# Patient Record
Sex: Female | Born: 1970 | Race: Black or African American | Hispanic: No | Marital: Single | Smoking: Never smoker
Health system: Southern US, Community
[De-identification: ages and names within clinical notes are randomized; demographics above are authoritative.]

## PROBLEM LIST (undated history)

## (undated) DIAGNOSIS — K315 Obstruction of duodenum: Secondary | ICD-10-CM

## (undated) DIAGNOSIS — T7840XA Allergy, unspecified, initial encounter: Secondary | ICD-10-CM

## (undated) DIAGNOSIS — C50911 Malignant neoplasm of unspecified site of right female breast: Secondary | ICD-10-CM

## (undated) DIAGNOSIS — Z8049 Family history of malignant neoplasm of other genital organs: Secondary | ICD-10-CM

## (undated) DIAGNOSIS — J45909 Unspecified asthma, uncomplicated: Secondary | ICD-10-CM

## (undated) DIAGNOSIS — E785 Hyperlipidemia, unspecified: Secondary | ICD-10-CM

## (undated) DIAGNOSIS — R51 Headache: Secondary | ICD-10-CM

## (undated) DIAGNOSIS — F419 Anxiety disorder, unspecified: Secondary | ICD-10-CM

## (undated) DIAGNOSIS — Z931 Gastrostomy status: Secondary | ICD-10-CM

## (undated) DIAGNOSIS — F32A Depression, unspecified: Secondary | ICD-10-CM

## (undated) DIAGNOSIS — G893 Neoplasm related pain (acute) (chronic): Secondary | ICD-10-CM

## (undated) DIAGNOSIS — C786 Secondary malignant neoplasm of retroperitoneum and peritoneum: Secondary | ICD-10-CM

## (undated) DIAGNOSIS — I471 Supraventricular tachycardia, unspecified: Secondary | ICD-10-CM

## (undated) DIAGNOSIS — Z923 Personal history of irradiation: Secondary | ICD-10-CM

## (undated) DIAGNOSIS — I10 Essential (primary) hypertension: Secondary | ICD-10-CM

## (undated) DIAGNOSIS — C801 Malignant (primary) neoplasm, unspecified: Secondary | ICD-10-CM

## (undated) DIAGNOSIS — K219 Gastro-esophageal reflux disease without esophagitis: Secondary | ICD-10-CM

## (undated) HISTORY — DX: Anxiety disorder, unspecified: F41.9

## (undated) HISTORY — DX: Hyperlipidemia, unspecified: E78.5

## (undated) HISTORY — DX: Allergy, unspecified, initial encounter: T78.40XA

## (undated) HISTORY — DX: Depression, unspecified: F32.A

## (undated) HISTORY — DX: Gastro-esophageal reflux disease without esophagitis: K21.9

## (undated) HISTORY — PX: ABDOMINAL HYSTERECTOMY: SHX81

## (undated) HISTORY — PX: BREAST SURGERY: SHX581

## (undated) HISTORY — DX: Family history of malignant neoplasm of other genital organs: Z80.49

## (undated) HISTORY — PX: TUBAL LIGATION: SHX77

---

## 2003-10-29 ENCOUNTER — Emergency Department (HOSPITAL_COMMUNITY): Admission: EM | Admit: 2003-10-29 | Discharge: 2003-10-30 | Payer: Self-pay | Admitting: Emergency Medicine

## 2008-06-21 DIAGNOSIS — I82409 Acute embolism and thrombosis of unspecified deep veins of unspecified lower extremity: Secondary | ICD-10-CM

## 2008-06-21 HISTORY — DX: Acute embolism and thrombosis of unspecified deep veins of unspecified lower extremity: I82.409

## 2009-08-18 ENCOUNTER — Ambulatory Visit: Payer: Self-pay | Admitting: Internal Medicine

## 2009-08-21 ENCOUNTER — Encounter: Admission: RE | Admit: 2009-08-21 | Discharge: 2009-08-21 | Payer: Self-pay | Admitting: Family Medicine

## 2009-09-05 ENCOUNTER — Ambulatory Visit: Payer: Self-pay | Admitting: Internal Medicine

## 2009-09-05 ENCOUNTER — Encounter (INDEPENDENT_AMBULATORY_CARE_PROVIDER_SITE_OTHER): Payer: Self-pay | Admitting: Family Medicine

## 2009-09-05 LAB — CONVERTED CEMR LAB
AST: 17 units/L (ref 0–37)
Albumin: 4 g/dL (ref 3.5–5.2)
Alkaline Phosphatase: 65 units/L (ref 39–117)
BUN: 11 mg/dL (ref 6–23)
Basophils Absolute: 0.1 10*3/uL (ref 0.0–0.1)
Basophils Relative: 1 % (ref 0–1)
Eosinophils Relative: 2 % (ref 0–5)
Glucose, Bld: 126 mg/dL — ABNORMAL HIGH (ref 70–99)
HDL: 56 mg/dL (ref >39)
Hemoglobin: 9.9 g/dL — ABNORMAL LOW (ref 12.0–15.0)
LDL Cholesterol: 89 mg/dL (ref 0–99)
Lymphocytes Relative: 33 % (ref 12–46)
MCHC: 29.6 g/dL — ABNORMAL LOW (ref 30.0–36.0)
Monocytes Absolute: 0.3 10*3/uL (ref 0.1–1.0)
Neutro Abs: 5.1 10*3/uL (ref 1.7–7.7)
Platelets: 338 10*3/uL (ref 150–400)
Potassium: 3.8 meq/L (ref 3.5–5.3)
RDW: 19.5 % — ABNORMAL HIGH (ref 11.5–15.5)
Sodium: 139 meq/L (ref 135–145)
Total Bilirubin: 0.2 mg/dL — ABNORMAL LOW (ref 0.3–1.2)
Total CHOL/HDL Ratio: 3.1
Triglycerides: 144 mg/dL (ref <150)
VLDL: 29 mg/dL (ref 0–40)

## 2009-09-16 ENCOUNTER — Ambulatory Visit (HOSPITAL_COMMUNITY): Admission: RE | Admit: 2009-09-16 | Discharge: 2009-09-16 | Payer: Self-pay | Admitting: General Surgery

## 2009-10-09 ENCOUNTER — Encounter (INDEPENDENT_AMBULATORY_CARE_PROVIDER_SITE_OTHER): Payer: Self-pay | Admitting: Family Medicine

## 2009-10-09 ENCOUNTER — Ambulatory Visit: Payer: Self-pay | Admitting: Internal Medicine

## 2009-10-09 ENCOUNTER — Other Ambulatory Visit: Admission: RE | Admit: 2009-10-09 | Discharge: 2009-10-09 | Payer: Self-pay | Admitting: Family Medicine

## 2010-04-01 ENCOUNTER — Encounter (INDEPENDENT_AMBULATORY_CARE_PROVIDER_SITE_OTHER): Payer: Self-pay | Admitting: *Deleted

## 2010-04-01 LAB — CONVERTED CEMR LAB
ALT: 10 units/L (ref 0–35)
BUN: 10 mg/dL (ref 6–23)
CO2: 23 meq/L (ref 19–32)
Calcium: 9.1 mg/dL (ref 8.4–10.5)
Chloride: 105 meq/L (ref 96–112)
Creatinine, Ser: 0.8 mg/dL (ref 0.40–1.20)
Free T4: 0.93 ng/dL (ref 0.80–1.80)
Glucose, Bld: 84 mg/dL (ref 70–99)
T3, Free: 3.2 pg/mL (ref 2.3–4.2)
TSH: 1.463 microintl units/mL (ref 0.350–4.500)
Total Bilirubin: 0.4 mg/dL (ref 0.3–1.2)

## 2010-04-15 ENCOUNTER — Ambulatory Visit (HOSPITAL_COMMUNITY): Admission: RE | Admit: 2010-04-15 | Discharge: 2010-04-15 | Payer: Self-pay | Admitting: Family Medicine

## 2010-06-05 ENCOUNTER — Other Ambulatory Visit
Admission: RE | Admit: 2010-06-05 | Discharge: 2010-06-05 | Payer: Self-pay | Source: Home / Self Care | Admitting: Obstetrics and Gynecology

## 2010-06-05 ENCOUNTER — Ambulatory Visit: Payer: Self-pay | Admitting: Obstetrics and Gynecology

## 2010-06-05 ENCOUNTER — Encounter (INDEPENDENT_AMBULATORY_CARE_PROVIDER_SITE_OTHER): Payer: Self-pay | Admitting: *Deleted

## 2010-06-05 LAB — CONVERTED CEMR LAB
MCV: 68.7 fL — ABNORMAL LOW (ref 78.0–100.0)
Platelets: 306 10*3/uL (ref 150–400)
RBC: 4.54 M/uL (ref 3.87–5.11)
TSH: 2.569 microintl units/mL (ref 0.350–4.500)
WBC: 8.1 10*3/uL (ref 4.0–10.5)

## 2010-06-08 LAB — CONVERTED CEMR LAB: Pap Smear: NEGATIVE

## 2010-06-21 DIAGNOSIS — C50919 Malignant neoplasm of unspecified site of unspecified female breast: Secondary | ICD-10-CM

## 2010-06-21 HISTORY — DX: Malignant neoplasm of unspecified site of unspecified female breast: C50.919

## 2010-07-09 ENCOUNTER — Ambulatory Visit
Admission: RE | Admit: 2010-07-09 | Discharge: 2010-07-09 | Payer: Self-pay | Source: Home / Self Care | Attending: Obstetrics and Gynecology | Admitting: Obstetrics and Gynecology

## 2010-07-24 NOTE — H&P (Signed)
Chelsea, Hansen                 ACCOUNT NO.:  [id number]  MEDICAL RECORD NO.:  [id number]           PATIENT TYPE:  LOCATION:  [facility]                     FACILITY:  PHYSICIAN:  Catalina Antigua, MD     DATE OF BIRTH:  Nov 22, 1970  DATE OF SERVICE:                          PRE-OP HISTORY & PHYSICAL  HISTORY OF PRESENT ILLNESS:  This is a 40 year old gravida 6, para 5-0-1- 5 with symptomatic fibroid uterus and menorrhagia who presents today requesting surgical intervention.  The patient was last seen in December 2011 with the same complaint but surgical intervention was delayed secondary to uncontrolled hypertension.  The patient presents today after being evaluated by her primary care physician and started on anti- hypertensive regimen and is ready for surgery.  The patient has a longstanding history of dysmenorrhea, menorrhagia, and pelvic pain which has been progressively worsening and no longer relieved by over-the- counter pain medication.  PAST MEDICAL HISTORY:  Significant for asthma with her last attack approximately 2 months ago.  Denies any hospitalization or intubation also significant for hypertension, and history of thyroid disease for which she is not currently on any medication.  PAST SURGICAL HISTORY:  Significant for bilateral tubal ligation in 1997 and ectopic pregnancy in 2007 treated with salpingectomy.  PAST OB HISTORY:  She has had 5 full-term vaginal history and an ectopic pregnancy.  PAST GYN HISTORY:  She denies any history of abnormal Pap smear.  She does have fibroid uterus.  SOCIAL HISTORY:  She denies drinking smoking or the use of illicit drugs.  FAMILY HISTORY:  Significant for hypertension.  REVIEW OF SYSTEMS:  Otherwise within normal limits.  PHYSICAL EXAMINATION:  VITAL SIGNS:  Blood pressure is 128/82, pulse of 69, weight of 216 pounds, height of 5 feet 11 inches. LUNGS:  Clear to auscultation bilaterally. HEART:  Regular rate and  rhythm. ABDOMEN:  Soft, nontender, nondistended but obese. PELVIC:  She has approximately 16-week size fibroid uterus.  No palpable adnexal masses or tenderness.  ASSESSMENT/PLAN:  This is a 40 year old para 5 with history of dysmenorrhea and menorrhagia secondary to fibroid uterus who presents today requesting surgical intervention.  The patient received a dose of Depo-Provera in December for management of her menorrhagia which she states has significantly improved.  She reports occasional spotting to no bleeding, but the patient does not desire to continue with medical management, and was counseled for total abdominal hysterectomy.  Risks, benefits, and alternatives were explained to the patient including risk of bleeding, infection, and damage to adjacent organs, risk of deep vein thrombosis and possibly pulmonary embolus.  The patient verbalized understanding.  All questions were answered.  The patient will be scheduled for total abdominal hysterectomy.          ______________________________ Catalina Antigua, MD    PC/MEDQ  D:  07/09/2010  T:  07/10/2010  Job:  [id number]

## 2010-08-11 ENCOUNTER — Encounter (HOSPITAL_COMMUNITY)
Admission: RE | Admit: 2010-08-11 | Discharge: 2010-08-11 | Disposition: A | Discharge status: Home / Self Care | Payer: Medicaid Other | Source: Ambulatory Visit | Attending: Obstetrics and Gynecology | Admitting: Obstetrics and Gynecology

## 2010-08-11 DIAGNOSIS — Z01812 Encounter for preprocedural laboratory examination: Secondary | ICD-10-CM | POA: Insufficient documentation

## 2010-08-11 LAB — SURGICAL PCR SCREEN
MRSA, PCR: NEGATIVE
Staphylococcus aureus: NEGATIVE

## 2010-08-11 LAB — CBC
MCV: 70.1 fL — ABNORMAL LOW (ref 78.0–100.0)
Platelets: 296 10*3/uL (ref 150–400)
RDW: 19.4 % — ABNORMAL HIGH (ref 11.5–15.5)
WBC: 8.2 10*3/uL (ref 4.0–10.5)

## 2010-08-11 LAB — BASIC METABOLIC PANEL
BUN: 12 mg/dL (ref 6–23)
Creatinine, Ser: 0.88 mg/dL (ref 0.4–1.2)
GFR calc Af Amer: >60 mL/min (ref >60)
GFR calc non Af Amer: >60 mL/min (ref >60)
Potassium: 3.5 mEq/L (ref 3.5–5.1)

## 2010-08-18 ENCOUNTER — Other Ambulatory Visit: Payer: Self-pay | Admitting: Obstetrics and Gynecology

## 2010-08-18 ENCOUNTER — Inpatient Hospital Stay (HOSPITAL_COMMUNITY)
Admission: RE | Admit: 2010-08-18 | Discharge: 2010-08-20 | DRG: 743 | Disposition: A | Discharge status: Home / Self Care | Payer: Medicaid Other | Source: Ambulatory Visit | Attending: Obstetrics and Gynecology | Admitting: Obstetrics and Gynecology

## 2010-08-18 DIAGNOSIS — N949 Unspecified condition associated with female genital organs and menstrual cycle: Secondary | ICD-10-CM

## 2010-08-18 DIAGNOSIS — D250 Submucous leiomyoma of uterus: Secondary | ICD-10-CM

## 2010-08-18 DIAGNOSIS — N920 Excessive and frequent menstruation with regular cycle: Secondary | ICD-10-CM

## 2010-08-18 DIAGNOSIS — I10 Essential (primary) hypertension: Secondary | ICD-10-CM | POA: Diagnosis present

## 2010-08-18 DIAGNOSIS — D252 Subserosal leiomyoma of uterus: Secondary | ICD-10-CM | POA: Diagnosis present

## 2010-08-18 DIAGNOSIS — D251 Intramural leiomyoma of uterus: Secondary | ICD-10-CM | POA: Diagnosis present

## 2010-08-19 LAB — CBC
MCH: 21.4 pg — ABNORMAL LOW (ref 26.0–34.0)
MCV: 70.6 fL — ABNORMAL LOW (ref 78.0–100.0)
Platelets: 291 10*3/uL (ref 150–400)
RDW: 19.5 % — ABNORMAL HIGH (ref 11.5–15.5)
WBC: 14.9 10*3/uL — ABNORMAL HIGH (ref 4.0–10.5)

## 2010-08-23 NOTE — Op Note (Signed)
NAMEJADA, FASS                 ACCOUNT NO.:  [id number]  MEDICAL RECORD NO.:  [id number]           PATIENT TYPE:  I  LOCATION:  9320                          FACILITY:  [facility]  PHYSICIAN:  Catalina Antigua, MD     DATE OF BIRTH:  1971-02-02  DATE OF PROCEDURE:  08/18/2010 DATE OF DISCHARGE:                              OPERATIVE REPORT   PREOPERATIVE DIAGNOSIS:  A 40 year old G6, P5-0-1-5 with longstanding history of menorrhagia and pelvic pain secondary to fibroid uterus.  POSTOPERATIVE DIAGNOSIS:  A 40 year old G6, P5-0-1-5 with longstanding history of menorrhagia and pelvic pain secondary to fibroid uterus.  PROCEDURES: 1. Total abdominal hysterectomy. 2. Lysis of adhesions.  SURGEON:  Catalina Antigua, MD  ASSISTANT:  Lesly Dukes, MD  ANESTHESIA:  General.  IV FLUIDS:  1000 mL.  URINE OUTPUT:  200 mL.  ESTIMATED BLOOD LOSS:  100 mL.  COMPLICATIONS:  None.  FINDINGS:  Approximately 14 weeks' size fibroid uterus, grossly normal bilateral ovaries and tubes with evidence of tubal ligation, bowel adhesions noted to the left adnexal region.  SPECIMEN COLLECTED:  Uterus and cervix and was sent to Pathology.  DESCRIPTION OF PROCEDURE:  After informed consent was obtained, the patient was taken to the operating room where anesthesia was induced and found to be adequate.  The patient was placed in dorsal lithotomy position and prepped and draped in usual sterile fashion.  A Pfannenstiel skin incision was made with a scalpel and carried down to the underlying layer of fascia with the Bovie.  The fascia was incised in the midline, incision extended laterally with the Mayo scissors.  The inferior aspect of fascial incision was grasped with Kocher clamps, tented up, and the underlying rectus muscle dissected off.  Attention was then turned to the superior aspect of fascial incision which in similar fashion was grasped with Kocher clamps and the underlying rectus muscle  dissected off.  The rectus muscle was separated in the midline, perineum identified, tented up, and entered bluntly.  This incision was extended superiorly and inferiorly with good visualization of the bladder.  The Balfour self-retaining tractor was introduced to the abdominal cavity.  The bowel was carefully packed away with moist laparotomy sponges.  The bladder blade was inserted.  The uterus was grasped in its cornual region with Kelly clamps to aid with lysis of adhesion was then performed as to free the adherent bowel to the left adnexal region using Metzenbaum scissors.  The round ligaments on both sides were grasped with Babcock clamp, suture ligated, and transected. The anterior leaf of the broad ligament was incised, was entered bilaterally and transected to the midline to as to create the bladder flap.  The uteroovarian ligaments on both sides were clamped with Kelly clamp, transected, and suture ligated x2.  Excellent hemostasis was noted.  Two uterine arteries were skeletonized bilaterally, grasped with a Heaney clamps, transected and suture ligated.  Again, hemostasis was visualized.  The uterosacral ligaments were clamped bilaterally, transected, and suture ligated, again hemostasis was maintained.  The uterus and cervix were amputated using Mayo scissors and specimen sent to Pathology.  The vaginal cuff was reapproximated in a running locked fashion using 0 Vicryl and hemostasis was visualized.  Copious irrigation of the abdomen was then performed.  Hemostasis was visualized.  Surgicel was applied at the raw surface of the previously adherent bowel just to ensure persistence of hemostasis.  All instruments and laparotomy sponges were removed from the patient's abdomen.  The fascia was reapproximated with the PDS suture and the skin was closed with staples.  The patient tolerated the procedure well. Sponge, lap, needle count were correct x2.     Catalina Antigua,  MD     PC/MEDQ  D:  08/18/2010  T:  08/19/2010  Job:  [id number]  Electronically Signed by Catalina Antigua  on 08/23/2010 10:05:35 AM

## 2010-08-23 NOTE — Discharge Summary (Signed)
  Chelsea Hansen, Chelsea Hansen                 ACCOUNT NO.:  [id number]  MEDICAL RECORD NO.:  [id number]           PATIENT TYPE:  I  LOCATION:  9320                          [facility]ILITY:  [facility]  PHYSICIAN:  Catalina Antigua, MD     DATE OF BIRTH:  [birth year]  DATE OF ADMISSION:  08/18/2010 DATE OF DISCHARGE:  08/20/2010                              DISCHARGE SUMMARY   ADMISSION DIAGNOSES:  Menorrhagia and pelvic pain secondary to fibroid uterus, scheduled total abdominal hysterectomy.  POSTOPERATIVE DIAGNOSIS:  Status post total abdominal hysterectomy.  HOSPITAL COURSE:  This is a 40 year old G6, P5 with a longstanding history of menorrhagia and pelvic pain secondary to fibroid uterus medically managed with Depo-Provera who requested definitive treatment. The patient underwent total abdominal hysterectomy on August 18, 2010, which revealed an approximately 14 weeks' size fibroid uterus grossly normal in appearance along with a grossly normal-appearing cervix.  The patient's hospital course was unremarkable with the patient's vital signs ranging from blood pressure 107-137/51-82.  Her pulse ranged from 48-89, respiratory rate of 18, saturating 96-98% on room air.  The patient remained afebrile throughout her hospital course with a T-max of 98.8.  The patient maintain a urine output greater than 50-60 mL an hour, was ambulating and voiding and tolerating a regular diet by hospital day #2.  On her admission, hemoglobin was 10.4 and on hospital day #1, her hemoglobin was 9.4.  The patient was found to be hemodynamically stable for discharge on hospital day #2.  The patient was instructed to present to clinic on August 24, 2010, at 10:45 a.m. for staple removal.  The patient was instructed to return to MAU or call the office if she develops fevers, abdominal pain despite pain medication or abnormal drainage from her incision.  The patient was advised to keep the incision site clean and dry at all times.   The patient was provided with a prescription of Percocet 5/325 mg and ibuprofen 800 mg for pain as needed.  The patient was also instructed to abstain from heavy lifting for the next 6 weeks as well as to abstain from intercourse. The patient verbalized understanding.  All questions were answered.  The patient will be seen on Monday August 24, 2010, at 10:45 a.m. for staple removal in 5-6 weeks for postoperative check.     Catalina Antigua, MD     PC/MEDQ  D:  08/20/2010  T:  08/20/2010  Job:  [id number]  Electronically Signed by Catalina Antigua  on 08/23/2010 10:07:02 AM

## 2010-08-24 DIAGNOSIS — D259 Leiomyoma of uterus, unspecified: Secondary | ICD-10-CM

## 2010-08-24 DIAGNOSIS — Z09 Encounter for follow-up examination after completed treatment for conditions other than malignant neoplasm: Secondary | ICD-10-CM

## 2010-09-14 LAB — CBC
Platelets: 272 10*3/uL (ref 150–400)
RDW: 20.2 % — ABNORMAL HIGH (ref 11.5–15.5)

## 2010-09-14 LAB — BASIC METABOLIC PANEL
BUN: 11 mg/dL (ref 6–23)
Calcium: 8.9 mg/dL (ref 8.4–10.5)
GFR calc non Af Amer: >60 mL/min (ref >60)
Glucose, Bld: 93 mg/dL (ref 70–99)

## 2010-09-14 LAB — DIFFERENTIAL
Basophils Absolute: 0 10*3/uL (ref 0.0–0.1)
Eosinophils Relative: 4 % (ref 0–5)
Lymphocytes Relative: 32 % (ref 12–46)
Neutro Abs: 3.3 10*3/uL (ref 1.7–7.7)
Neutrophils Relative %: 54 % (ref 43–77)

## 2010-09-14 LAB — PREGNANCY, URINE: Preg Test, Ur: NEGATIVE

## 2010-10-05 ENCOUNTER — Ambulatory Visit: Payer: Medicaid Other | Admitting: Family Medicine

## 2010-10-05 DIAGNOSIS — Z09 Encounter for follow-up examination after completed treatment for conditions other than malignant neoplasm: Secondary | ICD-10-CM

## 2010-10-06 NOTE — Group Therapy Note (Signed)
Chelsea Hansen, Chelsea Hansen                 ACCOUNT NO.:  [id number]  MEDICAL RECORD NO.:  [id number]           PATIENT TYPE:  A  LOCATION:  [facility]                   FACILITY:  [facility]  PHYSICIAN:  Maryelizabeth Kaufmann, MD  DATE OF BIRTH:  10/13/1970  DATE OF SERVICE:  10/05/2010                                 CLINIC NOTE  CHIEF COMPLAINT:  Postoperative check.  HISTORY OF PRESENT ILLNESS:  This is a 40 year old gravida 6, para 5-0-1- 5 who initially presented with dramatic fibroid uterus and menorrhagia, was initially controlled with Depo-Provera, then requested surgical intervention.  She underwent TAH on 08/18/2010.  Her postoperative course has been benign.  She denies any fever, chills, nausea, vomiting. Denies any incisional complaints.  She is taking Tylenol currently for as needed pain.  She denies any vaginal bleeding and her last bowel movement was 2 days ago.  She has not done any heavy lifting.  She denies any acute complaints today.  PHYSICAL EXAMINATION:  VITAL SIGNS:  Blood pressure 143/88, pulse 64, temperature 98.1, weight 221.9, and height 5 feet 11 inches. GENERAL:  The patient is in no acute distress.  Alert and oriented x4. ABDOMEN:  Positive bowel sounds, soft.  Some mild lower incisional pain or tenderness to palpation.  Incision is clean, dry, and  intact, well healed.  The patient did have some remaining Steri-Strips that were in place that I removed.  The incision otherwise looks very well healed. No redness or swelling.  No evidence of any type of infection. GU:  Normal external genitalia.  Vaginal cuff appears to be well healed with granulation tissue present.  There is no active bleeding and normal vaginal mucosa, pink with normal rugae.  ASSESSMENT AND PLAN:  This is a 40 year old gravida 6, para 5-0-1-5, status post total abdominal hysterectomy on August 18, 2010, presenting for postoperative wound check doing well.  The patient's postoperative hemoglobin  was 9.4.  She was not sent home on any iron. We will check a CBC today, otherwise, the patient can return to clinic in 6 months or as needed for postop check for evaluation.          ______________________________ Maryelizabeth Kaufmann, MD    LC/MEDQ  D:  10/05/2010  T:  10/06/2010  Job:  [id number]

## 2010-11-18 ENCOUNTER — Emergency Department (HOSPITAL_COMMUNITY)
Admission: EM | Admit: 2010-11-18 | Discharge: 2010-11-18 | Disposition: A | Discharge status: Home / Self Care | Payer: Self-pay | Attending: Emergency Medicine | Admitting: Emergency Medicine

## 2010-11-18 ENCOUNTER — Emergency Department (HOSPITAL_COMMUNITY): Payer: Self-pay

## 2010-11-18 DIAGNOSIS — R1110 Vomiting, unspecified: Secondary | ICD-10-CM | POA: Insufficient documentation

## 2010-11-18 DIAGNOSIS — R05 Cough: Secondary | ICD-10-CM | POA: Insufficient documentation

## 2010-11-18 DIAGNOSIS — R059 Cough, unspecified: Secondary | ICD-10-CM | POA: Insufficient documentation

## 2010-11-18 DIAGNOSIS — J069 Acute upper respiratory infection, unspecified: Secondary | ICD-10-CM | POA: Insufficient documentation

## 2010-11-18 DIAGNOSIS — J45909 Unspecified asthma, uncomplicated: Secondary | ICD-10-CM | POA: Insufficient documentation

## 2010-11-18 DIAGNOSIS — J029 Acute pharyngitis, unspecified: Secondary | ICD-10-CM | POA: Insufficient documentation

## 2010-11-18 DIAGNOSIS — I10 Essential (primary) hypertension: Secondary | ICD-10-CM | POA: Insufficient documentation

## 2010-11-18 LAB — CBC
Hemoglobin: 10.9 g/dL — ABNORMAL LOW (ref 12.0–15.0)
MCHC: 31.8 g/dL (ref 30.0–36.0)
RBC: 4.89 MIL/uL (ref 3.87–5.11)

## 2011-01-02 ENCOUNTER — Emergency Department (HOSPITAL_COMMUNITY)
Admission: EM | Admit: 2011-01-02 | Discharge: 2011-01-02 | Disposition: A | Discharge status: Home / Self Care | Payer: Self-pay | Attending: Emergency Medicine | Admitting: Emergency Medicine

## 2011-01-02 DIAGNOSIS — L02219 Cutaneous abscess of trunk, unspecified: Secondary | ICD-10-CM | POA: Insufficient documentation

## 2011-01-02 DIAGNOSIS — R51 Headache: Secondary | ICD-10-CM | POA: Insufficient documentation

## 2011-01-02 DIAGNOSIS — R109 Unspecified abdominal pain: Secondary | ICD-10-CM | POA: Insufficient documentation

## 2011-01-02 DIAGNOSIS — M79609 Pain in unspecified limb: Secondary | ICD-10-CM | POA: Insufficient documentation

## 2011-01-02 DIAGNOSIS — I10 Essential (primary) hypertension: Secondary | ICD-10-CM | POA: Insufficient documentation

## 2011-01-02 DIAGNOSIS — S30860A Insect bite (nonvenomous) of lower back and pelvis, initial encounter: Secondary | ICD-10-CM | POA: Insufficient documentation

## 2011-01-02 DIAGNOSIS — R112 Nausea with vomiting, unspecified: Secondary | ICD-10-CM | POA: Insufficient documentation

## 2011-01-02 DIAGNOSIS — W57XXXA Bitten or stung by nonvenomous insect and other nonvenomous arthropods, initial encounter: Secondary | ICD-10-CM | POA: Insufficient documentation

## 2011-01-02 DIAGNOSIS — R197 Diarrhea, unspecified: Secondary | ICD-10-CM | POA: Insufficient documentation

## 2011-01-02 DIAGNOSIS — L03319 Cellulitis of trunk, unspecified: Secondary | ICD-10-CM | POA: Insufficient documentation

## 2011-01-02 DIAGNOSIS — J45909 Unspecified asthma, uncomplicated: Secondary | ICD-10-CM | POA: Insufficient documentation

## 2011-01-02 LAB — DIFFERENTIAL
Basophils Relative: 0 % (ref 0–1)
Eosinophils Relative: 3 % (ref 0–5)
Monocytes Absolute: 0.7 10*3/uL (ref 0.1–1.0)
Monocytes Relative: 8 % (ref 3–12)
Neutrophils Relative %: 55 % (ref 43–77)

## 2011-01-02 LAB — CBC
HCT: 31.5 % — ABNORMAL LOW (ref 36.0–46.0)
Hemoglobin: 10.5 g/dL — ABNORMAL LOW (ref 12.0–15.0)
RBC: 4.55 MIL/uL (ref 3.87–5.11)

## 2011-01-02 LAB — BASIC METABOLIC PANEL
CO2: 27 mEq/L (ref 19–32)
Calcium: 8.8 mg/dL (ref 8.4–10.5)
Chloride: 101 mEq/L (ref 96–112)
Glucose, Bld: 88 mg/dL (ref 70–99)
Potassium: 3.5 mEq/L (ref 3.5–5.1)
Sodium: 138 mEq/L (ref 135–145)

## 2011-01-02 LAB — URINALYSIS, ROUTINE W REFLEX MICROSCOPIC
Glucose, UA: NEGATIVE mg/dL
Hgb urine dipstick: NEGATIVE
Leukocytes, UA: NEGATIVE
Specific Gravity, Urine: 1.021 (ref 1.005–1.030)
pH: 6.5 (ref 5.0–8.0)

## 2011-01-02 LAB — POCT PREGNANCY, URINE: Preg Test, Ur: NEGATIVE

## 2011-12-31 ENCOUNTER — Observation Stay (HOSPITAL_COMMUNITY): Payer: Self-pay

## 2011-12-31 ENCOUNTER — Encounter (HOSPITAL_COMMUNITY): Payer: Self-pay | Admitting: Emergency Medicine

## 2011-12-31 ENCOUNTER — Observation Stay (HOSPITAL_COMMUNITY)
Admission: EM | Admit: 2011-12-31 | Discharge: 2012-01-02 | Disposition: A | Discharge status: Home / Self Care | Payer: 59 | Attending: Internal Medicine | Admitting: Internal Medicine

## 2011-12-31 ENCOUNTER — Emergency Department (HOSPITAL_COMMUNITY): Payer: Self-pay

## 2011-12-31 DIAGNOSIS — R55 Syncope and collapse: Secondary | ICD-10-CM

## 2011-12-31 DIAGNOSIS — R599 Enlarged lymph nodes, unspecified: Secondary | ICD-10-CM | POA: Insufficient documentation

## 2011-12-31 DIAGNOSIS — R0602 Shortness of breath: Secondary | ICD-10-CM | POA: Insufficient documentation

## 2011-12-31 DIAGNOSIS — R4189 Other symptoms and signs involving cognitive functions and awareness: Secondary | ICD-10-CM

## 2011-12-31 DIAGNOSIS — I498 Other specified cardiac arrhythmias: Secondary | ICD-10-CM | POA: Insufficient documentation

## 2011-12-31 DIAGNOSIS — R590 Localized enlarged lymph nodes: Secondary | ICD-10-CM | POA: Diagnosis present

## 2011-12-31 DIAGNOSIS — R001 Bradycardia, unspecified: Secondary | ICD-10-CM

## 2011-12-31 DIAGNOSIS — E876 Hypokalemia: Secondary | ICD-10-CM | POA: Diagnosis present

## 2011-12-31 DIAGNOSIS — J45909 Unspecified asthma, uncomplicated: Secondary | ICD-10-CM

## 2011-12-31 DIAGNOSIS — D649 Anemia, unspecified: Secondary | ICD-10-CM | POA: Diagnosis present

## 2011-12-31 DIAGNOSIS — D61818 Other pancytopenia: Secondary | ICD-10-CM | POA: Diagnosis present

## 2011-12-31 DIAGNOSIS — I10 Essential (primary) hypertension: Secondary | ICD-10-CM | POA: Insufficient documentation

## 2011-12-31 DIAGNOSIS — R079 Chest pain, unspecified: Secondary | ICD-10-CM | POA: Diagnosis present

## 2011-12-31 HISTORY — DX: Unspecified asthma, uncomplicated: J45.909

## 2011-12-31 HISTORY — DX: Essential (primary) hypertension: I10

## 2011-12-31 LAB — CBC WITH DIFFERENTIAL/PLATELET
Basophils Absolute: 0 10*3/uL (ref 0.0–0.1)
Eosinophils Relative: 2 % (ref 0–5)
HCT: 34.8 % — ABNORMAL LOW (ref 36.0–46.0)
HCT: 34.3 % — ABNORMAL LOW (ref 36.0–46.0)
Hemoglobin: 11.5 g/dL — ABNORMAL LOW (ref 12.0–15.0)
Lymphocytes Relative: 32 % (ref 12–46)
Lymphocytes Relative: 35 % (ref 12–46)
Lymphs Abs: 2.4 10*3/uL (ref 0.7–4.0)
MCV: 71.9 fL — ABNORMAL LOW (ref 78.0–100.0)
Monocytes Relative: 10 % (ref 3–12)
Neutro Abs: 3.4 10*3/uL (ref 1.7–7.7)
Neutrophils Relative %: 55 % (ref 43–77)
Platelets: 226 10*3/uL (ref 150–400)
Platelets: 225 10*3/uL (ref 150–400)
RBC: 4.84 MIL/uL (ref 3.87–5.11)
RDW: 16.1 % — ABNORMAL HIGH (ref 11.5–15.5)
WBC: 7.4 10*3/uL (ref 4.0–10.5)
WBC: 6.1 10*3/uL (ref 4.0–10.5)

## 2011-12-31 LAB — POCT I-STAT TROPONIN I: Troponin i, poc: 0.01 ng/mL (ref 0.00–0.08)

## 2011-12-31 LAB — COMPREHENSIVE METABOLIC PANEL
Alkaline Phosphatase: 74 U/L (ref 39–117)
BUN: 12 mg/dL (ref 6–23)
GFR calc Af Amer: >90 mL/min (ref >90)
Glucose, Bld: 91 mg/dL (ref 70–99)
Potassium: 3.3 mEq/L — ABNORMAL LOW (ref 3.5–5.1)
Total Bilirubin: 0.3 mg/dL (ref 0.3–1.2)
Total Protein: 7.2 g/dL (ref 6.0–8.3)

## 2011-12-31 LAB — RAPID URINE DRUG SCREEN, HOSP PERFORMED
Cocaine: NOT DETECTED
Opiates: NOT DETECTED

## 2011-12-31 LAB — CARDIAC PANEL(CRET KIN+CKTOT+MB+TROPI)
CK, MB: 1.8 ng/mL (ref 0.3–4.0)
CK, MB: 1.7 ng/mL (ref 0.3–4.0)
Relative Index: 1.3 (ref 0.0–2.5)
Relative Index: 1.2 (ref 0.0–2.5)
Troponin I: <0.3 ng/mL (ref <0.30)
Troponin I: <0.3 ng/mL (ref <0.30)
Troponin I: <0.3 ng/mL (ref <0.30)

## 2011-12-31 LAB — RETICULOCYTES
RBC.: 4.74 MIL/uL (ref 3.87–5.11)
Retic Count, Absolute: 90.1 10*3/uL (ref 19.0–186.0)
Retic Ct Pct: 1.9 % (ref 0.4–3.1)

## 2011-12-31 LAB — LIPASE, BLOOD: Lipase: 29 U/L (ref 11–59)

## 2011-12-31 LAB — VITAMIN B12: Vitamin B-12: 783 pg/mL (ref 211–911)

## 2011-12-31 LAB — BASIC METABOLIC PANEL
BUN: 15 mg/dL (ref 6–23)
CO2: 25 mEq/L (ref 19–32)
Calcium: 9.2 mg/dL (ref 8.4–10.5)
Glucose, Bld: 108 mg/dL — ABNORMAL HIGH (ref 70–99)
Sodium: 139 mEq/L (ref 135–145)

## 2011-12-31 MED ORDER — IBUPROFEN 600 MG PO TABS
600.0000 mg | ORAL_TABLET | Freq: Four times a day (QID) | ORAL | Status: DC | PRN
  Administered 2011-12-31 (×2): 600 mg via ORAL
  Filled 2011-12-31 (×3): qty 1

## 2011-12-31 MED ORDER — FENTANYL CITRATE 0.05 MG/ML IJ SOLN
100.0000 ug | Freq: Once | INTRAMUSCULAR | Status: AC
  Administered 2011-12-31: 100 ug via INTRAVENOUS
  Filled 2011-12-31: qty 2

## 2011-12-31 MED ORDER — TECHNETIUM TC 99M MEDRONATE IV KIT
25.0000 | PACK | Freq: Once | INTRAVENOUS | Status: AC | PRN
  Administered 2011-12-31: 25 via INTRAVENOUS

## 2011-12-31 MED ORDER — ACETAMINOPHEN 650 MG RE SUPP: 650.0000 mg | Freq: Four times a day (QID) | RECTAL | Status: DC | PRN

## 2011-12-31 MED ORDER — SODIUM CHLORIDE 0.9 % IJ SOLN
3.0000 mL | Freq: Two times a day (BID) | INTRAMUSCULAR | Status: DC
  Administered 2011-12-31 – 2012-01-01 (×3): 3 mL via INTRAVENOUS

## 2011-12-31 MED ORDER — SODIUM CHLORIDE 0.9 % IV SOLN
INTRAVENOUS | Status: AC
  Administered 2011-12-31: 07:00:00 via INTRAVENOUS

## 2011-12-31 MED ORDER — SODIUM CHLORIDE 0.9 % IV SOLN
Freq: Once | INTRAVENOUS | Status: AC
  Administered 2011-12-31: 04:00:00 via INTRAVENOUS

## 2011-12-31 MED ORDER — GI COCKTAIL ~~LOC~~
30.0000 mL | Freq: Once | ORAL | Status: AC
  Administered 2011-12-31: 30 mL via ORAL
  Filled 2011-12-31: qty 30

## 2011-12-31 MED ORDER — POTASSIUM CHLORIDE CRYS ER 20 MEQ PO TBCR
40.0000 meq | EXTENDED_RELEASE_TABLET | Freq: Once | ORAL | Status: AC
  Administered 2011-12-31: 40 meq via ORAL
  Filled 2011-12-31: qty 2

## 2011-12-31 MED ORDER — ONDANSETRON HCL 4 MG PO TABS: 4.0000 mg | ORAL_TABLET | Freq: Four times a day (QID) | ORAL | Status: DC | PRN

## 2011-12-31 MED ORDER — ACETAMINOPHEN 325 MG PO TABS: 650.0000 mg | ORAL_TABLET | Freq: Four times a day (QID) | ORAL | Status: DC | PRN

## 2011-12-31 MED ORDER — HYDROCODONE-ACETAMINOPHEN 5-325 MG PO TABS
1.0000 | ORAL_TABLET | ORAL | Status: DC | PRN
  Administered 2011-12-31 – 2012-01-01 (×4): 1 via ORAL
  Filled 2011-12-31 (×4): qty 1

## 2011-12-31 MED ORDER — SODIUM CHLORIDE 0.9 % IV SOLN: INTRAVENOUS | Status: DC

## 2011-12-31 MED ORDER — ONDANSETRON HCL 4 MG/2ML IJ SOLN
4.0000 mg | Freq: Four times a day (QID) | INTRAMUSCULAR | Status: DC | PRN
  Administered 2012-01-01: 4 mg via INTRAVENOUS
  Filled 2011-12-31: qty 2

## 2011-12-31 MED ORDER — ONDANSETRON HCL 4 MG/2ML IJ SOLN: 4.0000 mg | Freq: Three times a day (TID) | INTRAMUSCULAR | Status: DC | PRN

## 2011-12-31 MED ORDER — IOHEXOL 350 MG/ML SOLN
100.0000 mL | Freq: Once | INTRAVENOUS | Status: AC | PRN
  Administered 2011-12-31: 100 mL via INTRAVENOUS

## 2011-12-31 NOTE — Care Management Note (Unsigned)
    Page 1 of 1   12/31/2011     11:51:44 AM   CARE MANAGEMENT NOTE 12/31/2011  Patient:  Chelsea Hansen, Chelsea Hansen   Account Number:  [id number]  Date Initiated:  12/31/2011  Documentation initiated by:  GRAVES-BIGELOW,Damya Comley  Subjective/Objective Assessment:   Pt admitted with cp and syncope. Plan for 2decho.     Action/Plan:   CM will continue to monitor for disposition needs.   Anticipated DC Date:  01/01/2012   Anticipated DC Plan:  HOME/SELF CARE      DC Planning Services  CM consult      Choice offered to / List presented to:             Status of service:  In process, will continue to follow Medicare Important Message given?   (If response is "NO", the following Medicare IM given date fields will be blank) Date Medicare IM given:   Date Additional Medicare IM given:    Discharge Disposition:    Per UR Regulation:  Reviewed for med. necessity/level of care/duration of stay  If discussed at Long Length of Stay Meetings, dates discussed:    Comments:

## 2011-12-31 NOTE — ED Notes (Signed)
Pt to CT at this time.  Pt made aware of plan of care.

## 2011-12-31 NOTE — Progress Notes (Signed)
UR Completed Iviana Blasingame Graves-Bigelow, RN,BSN [phone]  

## 2011-12-31 NOTE — Progress Notes (Signed)
VASCULAR LAB PRELIMINARY  PRELIMINARY  PRELIMINARY  PRELIMINARY  Carotid duplex completed.    Preliminary report:  Bilateral:  No evidence of hemodynamically significant internal carotid artery stenosis.   Vertebral artery flow is antegrade.     Hensley Aziz, RVS 12/31/2011, 5:09 PM

## 2011-12-31 NOTE — Consult Note (Signed)
Name: Chelsea Hansen MRN: [id number] DOB: 09/13/1970  Referring Physician:  [facility] Reason for Consultation:  Abnormal chest CT   INTERVENTIONAL PULMONOLOGY CONSULTATION NOTE   History of Present Illness: 40 yo admitted after having syncopal episode, which was witnessed by her daughter.  She reports having lost her consciousness for about 10 minutes and feeling limp.  There were no focal deficits.  She reports intermittent substernal chest pain for several days prior to admission.  It was stabbing and non radiating.  There was no shortness of breath or palpitations.  She denies fever or chills.  Chest CT agio demonstrated no PE but presence of mediastinal / hilar adenopathy.  Denies night sweats or fevers.  Past Medical History  Diagnosis Date  . Hypertension   . Asthma    Past Surgical History  Procedure Date  . Abdominal hysterectomy   . Breast surgery    Prior to Admission medications   Medication Sig Start Date End Date Taking? Authorizing Provider  acetaminophen (TYLENOL) 500 MG tablet Take 500 mg by mouth every 6 (six) hours as needed. For pain   Yes Historical Provider, MD   Allergies No Known Allergies  Family History Lung disease:  None Malignancies:  Noe  Social History Smoking:  Life long non-smoker Occupational exposure:  None  Review Of Systems: Constitutional: Negative for fever, chills, weight loss, malaise/and diaphoresis. Positive for fatigue. HENT: Negative for hearing loss, ear pain, nosebleeds, congestion, sore throat, neck pain, tinnitus and ear discharge.   Eyes: Negative for blurred vision, double vision, photophobia, pain, discharge and redness.  Respiratory: Negative for cough, hemoptysis, sputum production, shortness of breath, wheezing and stridor.   Cardiovascular: Negative for orthopnea, claudication, leg swelling and PND. Positive for chest pain. Gastrointestinal: Negative for heartburn, nausea, vomiting, abdominal pain, diarrhea, constipation,  blood in stool and melena.  Genitourinary: Negative for dysuria, urgency, frequency, hematuria and flank pain.  Musculoskeletal: Negative for myalgias, back pain, joint pain and falls.  Skin: Negative for itching and rash.  Neurological: Negative for dizziness, tingling, tremors, sensory change, speech change, focal weakness, seizures, lweakness and headaches. Positive for syncope. Endo/Heme/Allergies: Negative for environmental allergies and polydipsia. Does not bruise/bleed easily.   Vital Signs: Filed Vitals:   12/31/11 0833 12/31/11 0836 12/31/11 1430 12/31/11 1630  BP: 139/82 131/84 132/85 134/84  Pulse: 70 81 57 49  Temp:   98.3 F (36.8 C) 98.3 F (36.8 C)  TempSrc:   Oral Oral  Resp:   20 20  Height:      Weight:      SpO2: 100% 100% 99% 99%   Physical Examination: General:  No acute distress Neuro:  Alert, oriented, nonfocal HEENT:  Pink conjunctivae, moist mucus membranes Neck:  No lymphadenopathy Cardiovascular:  RRR, no murmurs.  Somewhat bradycardic on telemetry with HR in low 50s. Lungs:  Bilateral air entry, no wheezes, rhonchi, rales Abdomen:  Soft, nontender, nondistended Musculoskeletal:  No clubbing, cyanosis or edema Skin: No rash   Labs:  Lab 12/31/11 0910 12/31/11 0254  HGB 11.1* 11.5*  HCT 34.3* 34.8*  WBC 6.1 7.4  PLT 225 226  NA 139 139  K 3.3* 3.3*  CL 102 104  CO2 25 25  GLUCOSE 91 108*  BUN 12 15  CREATININE 0.83 0.94  CALCIUM 9.4 9.2  MG 1.9 --  PHOS -- --  INR -- --  APTT -- --   ECG:  7/12 >>>  Sinus bradycardia  Chest CT(images reviewed):  7/12 >>> No evidence   of pulmonary embolus. Significantly enlarged mediastinal and right hilar nodes seen, measuring up to 2.4 cm in the mediastinum and up to 1.9 cm in the right hilum and peribronchial region. There is some degree of narrowing of pulmonary artery branches to the right middle lobe as a result. Mild bilateral dependent subsegmental atelectasis noted; lungs otherwise clear.  PFT:   NA  TTE:  NA  ASSESSMENT AND PLAN  Syncope Mild intermittent asthma, no evidence of exacerbation Hilar / mediastinal lymphadenopathy, likely reactive, possibly sarcoidosis, less likely malignancy  -->  Will schedule for bronchoscopy / BAL / EBUS-TBNA. -->  May discharge when stable.  The procedure can be performed as outpatient.  K. Alyx Mcguirk, M.D., F.C.C.P. Interventional Pulmonology [facility] Cell: [phone] Pager: [phone]  12/31/2011, 9:24 PM   

## 2011-12-31 NOTE — ED Notes (Signed)
Pt to X Ray via stretcher at this time

## 2011-12-31 NOTE — ED Notes (Signed)
Pt c/o left chest pain x's 2 days.  Pt to ED via [facility].  Denies any cough or cold symptoms

## 2011-12-31 NOTE — Progress Notes (Signed)
[facility] PROGRESS NOTE  Chelsea Hansen:[id number] DOB: March 12, 1971 DOA: 12/31/2011 PCP: [name], MD  Assessment/Plan: Principal Problem:  *Syncope Active Problems:  Mediastinal adenopathy  Anemia  Chest pain  1. Syncope: Unclear etiology. Continue to monitor on telemetry.Orthostatic blood pressures negative. Followup 2-D echo and carotid Dopplers. 2. Chest pain: Likely musculoskeletal in etiology. Reproducible to palpation. Trial of NSAIDs. Continue to cycle cardiac enzymes and follow 2-D echo. CTA chest negative for PE. EKG without acute changes and cardiac panel x2 negative. 3. Mediastinal and hilar lymphadenopathy: Pulmonology has been consulted and plan to followup as outpatient (awaiting note). 4. Microcytic anemia: Possibly iron deficiency. Followup anemia panel. Will need outpatient followup. 5. Hypokalemia: Replete and follow up. 6. Hypertension: Reasonably controlled of medications. Monitor. 7. History of bronchial asthma: Stable. 8. 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 9. 1.1 cm lucency within the right frontal calvarium, nonspecific in appearance on CT head: Incidental finding: Consider outpatient followup and evaluation as deemed necessary.   Code Status: Full Family Communication:  Disposition Plan: If patient stable and workup negative then possible discharge on 7/13.  Brief narrative: 41 year old African American female patient with history of hypertension, asthma, right breast lumpectomy for fibroadenoma, admitted with a syncopal episode last night (witnessed) and 3 days history of lower substernal chest pain.  Consultants:  [facility]  Procedures:  None  Antibiotics:  None  HPI/Subjective: Patient denies prior history of passing out. She apparently was sitting on her bed and talking to her daughter and passed out at approximately 10 PM without any premonitory symptoms. She cannot recollect the duration that  she was out but on waking up denies any asymmetrical limb weakness or slurred speech or facial asymmetry. No reported seizure-like activity. No urinary incontinence. No tongue biting or frothing mouth. She does complain of slight dizziness on standing.  She complains of lower substernal, intermittent, sharp chest pain which is not radiating. It is relieved some by lying on the right side and on her back. Made worse with deep inspiration or coughing. Also tender to touch. Denies trauma or lifting any heavy weights recently. Denies dyspnea or cough. No history of GERD.  Objective: Filed Vitals:   12/31/11 0800 12/31/11 0830 12/31/11 0833 12/31/11 0836  BP: 128/78 126/77 139/82 131/84  Pulse: 57 60 70 81  Temp:  98.3 F (36.8 C)    TempSrc:  Oral    Resp: 26 22    Height:  5\' 11"  (1.803 m)    Weight:  106.232 kg (234 lb 3.2 oz)    SpO2: 98% 100% 100% 100%   No intake or output data in the 24 hours ending 12/31/11 1041  Exam: General exam: Comfortable. Respiratory system: Clear to auscultation. Reproducible lower substernal tenderness. No increased work of breathing. Cardiovascular system: First and second heart sounds heard, regular rate and rhythm. No murmurs, JVD or pedal edema. Telemetry shows sinus rhythm. Gastrointestinal system: Abdomen is nondistended, soft and nontender with normal bowel sounds. Central nervous system: Alert and oriented. No focal neurological deficits. Extremities: Symmetric 5 x 5 power.   Data Reviewed: Basic Metabolic Panel:  Lab 12/31/11 8119 12/31/11 0254  NA 139 139  K 3.3* 3.3*  CL 102 104  CO2 25 25  GLUCOSE 91 108*  BUN 12 15  CREATININE 0.83 0.94  CALCIUM 9.4 9.2  MG 1.9 --  PHOS -- --   Liver Function Tests:  Lab 12/31/11 0910  AST 17  ALT 19  ALKPHOS 74  BILITOT  0.3  PROT 7.2  ALBUMIN 3.6    Lab 12/31/11 0910  LIPASE 29  AMYLASE --   No results found for this basename: AMMONIA:5 in the last 168 hours CBC:  Lab 12/31/11  0910 12/31/11 0254  WBC 6.1 7.4  NEUTROABS 3.4 4.1  HGB 11.1* 11.5*  HCT 34.3* 34.8*  MCV 72.4* 71.9*  PLT 225 226   Cardiac Enzymes:  Lab 12/31/11 0910 12/31/11 0721  CKTOTAL 145 142  CKMB 1.7 1.8  CKMBINDEX -- --  TROPONINI <0.30 <0.30   BNP (last 3 results) No results found for this basename: PROBNP:3 in the last 8760 hours CBG: No results found for this basename: GLUCAP:5 in the last 168 hours  No results found for this or any previous visit (from the past 240 hour(s)).    EKG: Sinus rhythm at 62 beats per minute, normal axis,? Peaked T waves but otherwise no acute changes. QTC 410 ms.  Studies: Dg Chest 2 View  12/31/2011  *RADIOLOGY REPORT*  Clinical Data: Chest pain and shortness of breath.  CHEST - 2 VIEW  Comparison: Chest radiograph performed 11/18/2010  Findings: The lungs are well-aerated and clear.  There is no evidence of focal opacification, pleural effusion or pneumothorax.  The heart is normal in size; the mediastinal contour is within normal limits.  No acute osseous abnormalities are seen.  IMPRESSION: No acute cardiopulmonary process seen.  Original Report Authenticated By: Tonia Ghent, M.D.   Ct Head Wo Contrast  12/31/2011  *RADIOLOGY REPORT*  Clinical Data: Period of unresponsiveness; history of breast cancer.  CT HEAD WITHOUT CONTRAST  Technique:  Contiguous axial images were obtained from the base of the skull through the vertex without contrast.  Comparison: None.  Findings: There is no evidence of acute infarction, mass lesion, or intra- or extra-axial hemorrhage on CT.  The posterior fossa, including the cerebellum, brainstem and fourth ventricle, is within normal limits.  The third and lateral ventricles, and basal ganglia are unremarkable in appearance.  The cerebral hemispheres are symmetric in appearance, with normal gray- white differentiation.  No mass effect or midline shift is seen.  There is no evidence of fracture; a small 1.1 cm lucency within  the right frontal calvarium is nonspecific in appearance.  The orbits are within normal limits.  The paranasal sinuses and mastoid air cells are well-aerated.  No significant soft tissue abnormalities are seen.  IMPRESSION:  1.  No acute intracranial pathology seen on CT. 2.  1.1 cm lucency within the right frontal calvarium, nonspecific in appearance.  Depending on the degree of clinical concern, bone scan could be considered for further evaluation.  Original Report Authenticated By: Tonia Ghent, M.D.   Ct Angio Chest W/cm &/or Wo Cm  12/31/2011  *RADIOLOGY REPORT*  Clinical Data: Left-sided chest pain; elevated D-dimer.  CT ANGIOGRAPHY CHEST  Technique:  Multidetector CT imaging of the chest using the standard protocol during bolus administration of intravenous contrast. Multiplanar reconstructed images including MIPs were obtained and reviewed to evaluate the vascular anatomy.  Contrast: OMNIPAQUE IOHEXOL 350 MG/ML SOLN  Comparison: Chest radiograph performed earlier today at 03:11 a.m.  Findings: There is no evidence of pulmonary embolus.  Mild bilateral dependent subsegmental atelectasis is noted.  The lungs are otherwise clear.  There is no evidence of significant focal consolidation, pleural effusion or pneumothorax.  No masses are identified; no abnormal focal contrast enhancement is seen.  Significantly enlarged mediastinal and right hilar nodes are seen. The largest node measures 2.4  cm in short axis, in the right paratracheal region.  Additional enlarged precarinal, subcarinal, azygoesophageal recess, aortopulmonary window and periaortic nodes are seen.  Enlarged right hilar nodes measure up to 1.4 cm in size, and an enlarged 1.9 cm right peribronchial node is seen.  Additional enlarged right peribronchial nodes are also noted. These cause some degree of narrowing of pulmonary artery branches to the right middle lobe.  A borderline normal 1.0 cm left hilar node is noted.  No pericardial  effusion is identified.  The great vessels are grossly unremarkable in appearance.  No axillary lymphadenopathy is seen.  The visualized portions of the thyroid gland are unremarkable in appearance.  The visualized portions of the liver and spleen are unremarkable. The visualized portions of the pancreas, gallbladder, stomach, adrenal glands and kidneys are within normal limits.  No acute osseous abnormalities are seen.  IMPRESSION: . 1.  No evidence of pulmonary embolus. 2.  Significantly enlarged mediastinal and right hilar nodes seen, measuring up to 2.4 cm in the mediastinum and up to 1.9 cm in the right hilum and peribronchial region.  There is some degree of narrowing of pulmonary artery branches to the right middle lobe as a result.  This raises concern for underlying malignancy; this could also reflect a systemic condition such as sarcoidosis, though considered less likely given asymmetric hilar lymphadenopathy.  Several of the mediastinal nodes would be amenable to transbronchial biopsy, as deemed clinically appropriate. 3.  Mild bilateral dependent subsegmental atelectasis noted; lungs otherwise clear.  Original Report Authenticated By: Tonia Ghent, M.D.    Scheduled Meds:    . sodium chloride   Intravenous Once  . sodium chloride   Intravenous STAT  . fentaNYL  100 mcg Intravenous Once  . sodium chloride  3 mL Intravenous Q12H   Continuous Infusions:    . sodium chloride       Bilbo Carcamo [facility] Pager [phone] If 7PM-7AM, please contact night-coverage [email or web address] Password [email or web address] 12/31/2011, 10:41 AM   LOS: 0 days

## 2011-12-31 NOTE — ED Notes (Signed)
DR. Kennon Rounds AT BEDSIDE SPEAKING / EVALUATING PT.

## 2011-12-31 NOTE — H&P (Signed)
Chelsea Hansen is an 41 y.o. female.   PCP - [facility]. Chief Complaint: Loss of consciousness. HPI: 41 year-old female with history of hypertension and asthma and previous history of right-sided breast lumpectomy (pathology results show fibroadenoma) was brought to the ER after patient's daughter witnessed the patient having a syncopal episode last night while talking to her. Patient started to talk to the ER physician stated that she may have lost consciousness for around 5-10 minutes and was limp. Patient did not have any focal deficits prior to or after the incident. Patient has been experiencing chest pain for last 3 days. The chest pain is retrosternal lower half of the sternum stabbing in nature nonradiating present even at rest increased on inspiration and no associated shortness of breath. Patient denies any cough fever chills. In the ER patient had a CT head which the/at the acute except for nonspecific lucency in the calvarium and had been advised to get bone scan. CT angiogram of the chest showed significant right hilar and mediastinal lymphadenopathy. Patient has been admitted for further management.  Past Medical History  Diagnosis Date  . Hypertension   . Asthma     Past Surgical History  Procedure Date  . Abdominal hysterectomy   . Breast surgery     Family History  Problem Relation Age of Onset  . Hypertension Father    Social History:  reports that she has never smoked. She does not have any smokeless tobacco history on file. She reports that she drinks alcohol. She reports that she does not use illicit drugs.  Allergies: No Known Allergies   (Not in a hospital admission)  Results for orders placed during the hospital encounter of 12/31/11 (from the past 48 hour(s))  CBC WITH DIFFERENTIAL     Status: Abnormal   Collection Time   12/31/11  2:54 AM      Component Value Range Comment   WBC 7.4  4.0 - 10.5 K/uL    RBC 4.84  3.87 - 5.11 MIL/uL    Hemoglobin 11.5 (*)  12.0 - 15.0 g/dL    HCT 40.9 (*) 81.1 - 46.0 %    MCV 71.9 (*) 78.0 - 100.0 fL    MCH 23.8 (*) 26.0 - 34.0 pg    MCHC 33.0  30.0 - 36.0 g/dL    RDW 91.4 (*) 78.2 - 15.5 %    Platelets 226  150 - 400 K/uL    Neutrophils Relative 56  43 - 77 %    Neutro Abs 4.1  1.7 - 7.7 K/uL    Lymphocytes Relative 32  12 - 46 %    Lymphs Abs 2.4  0.7 - 4.0 K/uL    Monocytes Relative 10  3 - 12 %    Monocytes Absolute 0.7  0.1 - 1.0 K/uL    Eosinophils Relative 2  0 - 5 %    Eosinophils Absolute 0.2  0.0 - 0.7 K/uL    Basophils Relative 1  0 - 1 %    Basophils Absolute 0.0  0.0 - 0.1 K/uL   BASIC METABOLIC PANEL     Status: Abnormal   Collection Time   12/31/11  2:54 AM      Component Value Range Comment   Sodium 139  135 - 145 mEq/L    Potassium 3.3 (*) 3.5 - 5.1 mEq/L    Chloride 104  96 - 112 mEq/L    CO2 25  19 - 32 mEq/L    Glucose,  Bld 108 (*) 70 - 99 mg/dL    BUN 15  6 - 23 mg/dL    Creatinine, Ser 1.61  0.50 - 1.10 mg/dL    Calcium 9.2  8.4 - 09.6 mg/dL    GFR calc non Af Amer 75 (*) >90 mL/min    GFR calc Af Amer 87 (*) >90 mL/min   D-DIMER, QUANTITATIVE     Status: Abnormal   Collection Time   12/31/11  2:54 AM      Component Value Range Comment   D-Dimer, Quant 1.07 (*) 0.00 - 0.48 ug/mL-FEU   POCT I-STAT TROPONIN I     Status: Normal   Collection Time   12/31/11  3:08 AM      Component Value Range Comment   Troponin i, poc 0.01  0.00 - 0.08 ng/mL    Comment 3             Dg Chest 2 View  12/31/2011  *RADIOLOGY REPORT*  Clinical Data: Chest pain and shortness of breath.  CHEST - 2 VIEW  Comparison: Chest radiograph performed 11/18/2010  Findings: The lungs are well-aerated and clear.  There is no evidence of focal opacification, pleural effusion or pneumothorax.  The heart is normal in size; the mediastinal contour is within normal limits.  No acute osseous abnormalities are seen.  IMPRESSION: No acute cardiopulmonary process seen.  Original Report Authenticated By: Tonia Ghent,  M.D.   Ct Head Wo Contrast  12/31/2011  *RADIOLOGY REPORT*  Clinical Data: Period of unresponsiveness; history of breast cancer.  CT HEAD WITHOUT CONTRAST  Technique:  Contiguous axial images were obtained from the base of the skull through the vertex without contrast.  Comparison: None.  Findings: There is no evidence of acute infarction, mass lesion, or intra- or extra-axial hemorrhage on CT.  The posterior fossa, including the cerebellum, brainstem and fourth ventricle, is within normal limits.  The third and lateral ventricles, and basal ganglia are unremarkable in appearance.  The cerebral hemispheres are symmetric in appearance, with normal gray- white differentiation.  No mass effect or midline shift is seen.  There is no evidence of fracture; a small 1.1 cm lucency within the right frontal calvarium is nonspecific in appearance.  The orbits are within normal limits.  The paranasal sinuses and mastoid air cells are well-aerated.  No significant soft tissue abnormalities are seen.  IMPRESSION:  1.  No acute intracranial pathology seen on CT. 2.  1.1 cm lucency within the right frontal calvarium, nonspecific in appearance.  Depending on the degree of clinical concern, bone scan could be considered for further evaluation.  Original Report Authenticated By: Tonia Ghent, M.D.   Ct Angio Chest W/cm &/or Wo Cm  12/31/2011  *RADIOLOGY REPORT*  Clinical Data: Left-sided chest pain; elevated D-dimer.  CT ANGIOGRAPHY CHEST  Technique:  Multidetector CT imaging of the chest using the standard protocol during bolus administration of intravenous contrast. Multiplanar reconstructed images including MIPs were obtained and reviewed to evaluate the vascular anatomy.  Contrast: OMNIPAQUE IOHEXOL 350 MG/ML SOLN  Comparison: Chest radiograph performed earlier today at 03:11 a.m.  Findings: There is no evidence of pulmonary embolus.  Mild bilateral dependent subsegmental atelectasis is noted.  The lungs are otherwise  clear.  There is no evidence of significant focal consolidation, pleural effusion or pneumothorax.  No masses are identified; no abnormal focal contrast enhancement is seen.  Significantly enlarged mediastinal and right hilar nodes are seen. The largest node measures 2.4 cm in short  axis, in the right paratracheal region.  Additional enlarged precarinal, subcarinal, azygoesophageal recess, aortopulmonary window and periaortic nodes are seen.  Enlarged right hilar nodes measure up to 1.4 cm in size, and an enlarged 1.9 cm right peribronchial node is seen.  Additional enlarged right peribronchial nodes are also noted. These cause some degree of narrowing of pulmonary artery branches to the right middle lobe.  A borderline normal 1.0 cm left hilar node is noted.  No pericardial effusion is identified.  The great vessels are grossly unremarkable in appearance.  No axillary lymphadenopathy is seen.  The visualized portions of the thyroid gland are unremarkable in appearance.  The visualized portions of the liver and spleen are unremarkable. The visualized portions of the pancreas, gallbladder, stomach, adrenal glands and kidneys are within normal limits.  No acute osseous abnormalities are seen.  IMPRESSION: . 1.  No evidence of pulmonary embolus. 2.  Significantly enlarged mediastinal and right hilar nodes seen, measuring up to 2.4 cm in the mediastinum and up to 1.9 cm in the right hilum and peribronchial region.  There is some degree of narrowing of pulmonary artery branches to the right middle lobe as a result.  This raises concern for underlying malignancy; this could also reflect a systemic condition such as sarcoidosis, though considered less likely given asymmetric hilar lymphadenopathy.  Several of the mediastinal nodes would be amenable to transbronchial biopsy, as deemed clinically appropriate. 3.  Mild bilateral dependent subsegmental atelectasis noted; lungs otherwise clear.  Original Report Authenticated  By: Tonia Ghent, M.D.    Review of Systems  Constitutional: Negative.   HENT: Negative.   Eyes: Negative.   Cardiovascular: Positive for chest pain.  Gastrointestinal: Negative.   Genitourinary: Negative.   Musculoskeletal: Negative.   Skin: Negative.   Neurological: Positive for loss of consciousness.  Endo/Heme/Allergies: Negative.   Psychiatric/Behavioral: Negative.     Blood pressure 138/90, pulse 53, temperature 98.1 F (36.7 C), temperature source Oral, resp. rate 14, SpO2 98.00%. Physical Exam  Constitutional: She is oriented to person, place, and time. She appears well-developed and well-nourished. No distress.  HENT:  Head: Normocephalic and atraumatic.  Right Ear: External ear normal.  Left Ear: External ear normal.  Nose: Nose normal.  Mouth/Throat: Oropharynx is clear and moist. No oropharyngeal exudate.  Eyes: Conjunctivae are normal. Pupils are equal, round, and reactive to light. Right eye exhibits no discharge. Left eye exhibits no discharge. No scleral icterus.  Neck: Normal range of motion. Neck supple.  Cardiovascular:       Sinus bradycardia.  Respiratory: Effort normal and breath sounds normal. No respiratory distress. She has no wheezes. She has no rales.  GI: Soft. Bowel sounds are normal. She exhibits no distension. There is no tenderness. There is no rebound.  Musculoskeletal: Normal range of motion. She exhibits no edema and no tenderness.  Neurological: She is alert and oriented to person, place, and time.       Moves all extremities 5/5. No facial asymmetry.  Skin: Skin is warm and dry. She is not diaphoretic.  Psychiatric: Her behavior is normal.     Assessment/Plan #1. Syncope - patient's monitor shows sinus bradycardia. At this time we will closely monitor for any significant arrhythmia particularly bradycardia which could have precipitated syncope. Check orthostatic. Check urine drug screen. And get 2-D echo. #2. Chest pain, atypical -  check cardiac enzymes and as suggested earlier check 2-D echo. #3. Mediastinal and hilar lymphadenopathy - I have discussed with pulmonologist Dr. Reyne Dumas. Pulmonologist  will be seeing patient in consult for possible EBUS. Patient otherwise denies any weight loss fever chills night sweats. In addition we'll check LDH. #4. Anemia - check anemia profile. #5. History of hypertension and bronchial asthma patient is not on any medications for last one year - patient states her PCP have discontinued her antihypertensives and asthma medications more than a year ago. #6. History of right-sided breast lumpectomy pathology results show fibroadenoma. #7. History of hysterectomy for fibroids.  Patient's CT head showed nonspecific lucency and they have recommended bone scan. I have ordered bone scan. Since patient also has anemia have ordered SPEP and UPEP to check for myeloma.  CODE STATUS - full code.  Sohaib Vereen N. 12/31/2011, 7:11 AM

## 2011-12-31 NOTE — Progress Notes (Signed)
Patient's heart rate down to 40's sustaining.  Patient in bed denies any pain or lightheadiness.  EKG obtained.  Dr. Waymon Amato paged and notified.  No new orders received.  Will continue to monitor.  Colman Cater

## 2011-12-31 NOTE — ED Provider Notes (Signed)
History     CSN: [id number]  Arrival date & time 12/31/11  2130   First MD Initiated Contact with Patient 12/31/11 0230      Chief Complaint  Patient presents with  . Chest Pain    (Consider location/radiation/quality/duration/timing/severity/associated sxs/prior treatment) HPI This is a 41 year old black female with a three-day history of a sharp, well localized pain to the left of her sternum. It is worse with movement or palpation. The pain has become moderate to severe over the past several hours and there is some mild dyspnea associated with it. She states her family told her that she was unresponsive for about 10 minutes so EMS was called and she was brought here. She states EMS told her she had a second syncopal episode while walking to the ambulance. She has not had a cough or cold symptoms. She states her legs have been swelling for the past 2 months. Her heart rate is been noted to be as low as 48 in the ED.  Past Medical History  Diagnosis Date  . Hypertension   . Asthma     Past Surgical History  Procedure Date  . Abdominal hysterectomy     No family history on file.  History  Substance Use Topics  . Smoking status: Never Smoker   . Smokeless tobacco: Not on file  . Alcohol Use: Yes    OB History    Grav Para Term Preterm Abortions TAB SAB Ect Mult Living                  Review of Systems  All other systems reviewed and are negative.    Allergies  Review of patient's allergies indicates no known allergies.  Home Medications   Current Outpatient Rx  Name Route Sig Dispense Refill  . ACETAMINOPHEN 500 MG PO TABS Oral Take 500 mg by mouth every 6 (six) hours as needed. For pain      BP 167/89  Pulse 68  Temp 98.1 F (36.7 C) (Oral)  Resp 19  SpO2 98%  Physical Exam General: Well-developed, well-nourished female in no acute distress; appearance consistent with age of record HENT: normocephalic, atraumatic Eyes: pupils equal round and  reactive to light; extraocular muscles intact Neck: supple Heart: regular rate and rhythm; no murmurs; bradycardia in the 50s Lungs: clear to auscultation bilaterally Chest: Moderate severe, well localized left parasternal tenderness without crepitus Abdomen: soft; nondistended; nontender; bowel sounds present Extremities: No deformity; full range of motion; pulses normal; no edema appreciated Neurologic: Awake, alert and oriented; motor function intact in all extremities and symmetric; no facial droop Skin: Warm and dry Psychiatric: Normal mood and affect    ED Course  Procedures (including critical care time)   MDM   Nursing notes and vitals signs, including pulse oximetry, reviewed.  Summary of this visit's results, reviewed by myself:  Labs:  Results for orders placed during the hospital encounter of 12/31/11  CBC WITH DIFFERENTIAL      Component Value Range   WBC 7.4  4.0 - 10.5 K/uL   RBC 4.84  3.87 - 5.11 MIL/uL   Hemoglobin 11.5 (*) 12.0 - 15.0 g/dL   HCT 86.5 (*) 78.4 - 69.6 %   MCV 71.9 (*) 78.0 - 100.0 fL   MCH 23.8 (*) 26.0 - 34.0 pg   MCHC 33.0  30.0 - 36.0 g/dL   RDW 29.5 (*) 28.4 - 13.2 %   Platelets 226  150 - 400 K/uL   Neutrophils Relative  56  43 - 77 %   Neutro Abs 4.1  1.7 - 7.7 K/uL   Lymphocytes Relative 32  12 - 46 %   Lymphs Abs 2.4  0.7 - 4.0 K/uL   Monocytes Relative 10  3 - 12 %   Monocytes Absolute 0.7  0.1 - 1.0 K/uL   Eosinophils Relative 2  0 - 5 %   Eosinophils Absolute 0.2  0.0 - 0.7 K/uL   Basophils Relative 1  0 - 1 %   Basophils Absolute 0.0  0.0 - 0.1 K/uL  BASIC METABOLIC PANEL      Component Value Range   Sodium 139  135 - 145 mEq/L   Potassium 3.3 (*) 3.5 - 5.1 mEq/L   Chloride 104  96 - 112 mEq/L   CO2 25  19 - 32 mEq/L   Glucose, Bld 108 (*) 70 - 99 mg/dL   BUN 15  6 - 23 mg/dL   Creatinine, Ser 1.61  0.50 - 1.10 mg/dL   Calcium 9.2  8.4 - 09.6 mg/dL   GFR calc non Af Amer 75 (*) >90 mL/min   GFR calc Af Amer 87 (*)  >90 mL/min  D-DIMER, QUANTITATIVE      Component Value Range   D-Dimer, Quant 1.07 (*) 0.00 - 0.48 ug/mL-FEU  POCT I-STAT TROPONIN I      Component Value Range   Troponin i, poc 0.01  0.00 - 0.08 ng/mL   Comment 3             Imaging Studies: Dg Chest 2 View  12/31/2011  *RADIOLOGY REPORT*  Clinical Data: Chest pain and shortness of breath.  CHEST - 2 VIEW  Comparison: Chest radiograph performed 11/18/2010  Findings: The lungs are well-aerated and clear.  There is no evidence of focal opacification, pleural effusion or pneumothorax.  The heart is normal in size; the mediastinal contour is within normal limits.  No acute osseous abnormalities are seen.  IMPRESSION: No acute cardiopulmonary process seen.  Original Report Authenticated By: Tonia Ghent, M.D.   Ct Head Wo Contrast  12/31/2011  *RADIOLOGY REPORT*  Clinical Data: Period of unresponsiveness; history of breast cancer.  CT HEAD WITHOUT CONTRAST  Technique:  Contiguous axial images were obtained from the base of the skull through the vertex without contrast.  Comparison: None.  Findings: There is no evidence of acute infarction, mass lesion, or intra- or extra-axial hemorrhage on CT.  The posterior fossa, including the cerebellum, brainstem and fourth ventricle, is within normal limits.  The third and lateral ventricles, and basal ganglia are unremarkable in appearance.  The cerebral hemispheres are symmetric in appearance, with normal gray- white differentiation.  No mass effect or midline shift is seen.  There is no evidence of fracture; a small 1.1 cm lucency within the right frontal calvarium is nonspecific in appearance.  The orbits are within normal limits.  The paranasal sinuses and mastoid air cells are well-aerated.  No significant soft tissue abnormalities are seen.  IMPRESSION:  1.  No acute intracranial pathology seen on CT. 2.  1.1 cm lucency within the right frontal calvarium, nonspecific in appearance.  Depending on the degree  of clinical concern, bone scan could be considered for further evaluation.  Original Report Authenticated By: Tonia Ghent, M.D.   Ct Angio Chest W/cm &/or Wo Cm  12/31/2011  *RADIOLOGY REPORT*  Clinical Data: Left-sided chest pain; elevated D-dimer.  CT ANGIOGRAPHY CHEST  Technique:  Multidetector CT imaging of the chest using  the standard protocol during bolus administration of intravenous contrast. Multiplanar reconstructed images including MIPs were obtained and reviewed to evaluate the vascular anatomy.  Contrast: OMNIPAQUE IOHEXOL 350 MG/ML SOLN  Comparison: Chest radiograph performed earlier today at 03:11 a.m.  Findings: There is no evidence of pulmonary embolus.  Mild bilateral dependent subsegmental atelectasis is noted.  The lungs are otherwise clear.  There is no evidence of significant focal consolidation, pleural effusion or pneumothorax.  No masses are identified; no abnormal focal contrast enhancement is seen.  Significantly enlarged mediastinal and right hilar nodes are seen. The largest node measures 2.4 cm in short axis, in the right paratracheal region.  Additional enlarged precarinal, subcarinal, azygoesophageal recess, aortopulmonary window and periaortic nodes are seen.  Enlarged right hilar nodes measure up to 1.4 cm in size, and an enlarged 1.9 cm right peribronchial node is seen.  Additional enlarged right peribronchial nodes are also noted. These cause some degree of narrowing of pulmonary artery branches to the right middle lobe.  A borderline normal 1.0 cm left hilar node is noted.  No pericardial effusion is identified.  The great vessels are grossly unremarkable in appearance.  No axillary lymphadenopathy is seen.  The visualized portions of the thyroid gland are unremarkable in appearance.  The visualized portions of the liver and spleen are unremarkable. The visualized portions of the pancreas, gallbladder, stomach, adrenal glands and kidneys are within normal limits.  No  acute osseous abnormalities are seen.  IMPRESSION: . 1.  No evidence of pulmonary embolus. 2.  Significantly enlarged mediastinal and right hilar nodes seen, measuring up to 2.4 cm in the mediastinum and up to 1.9 cm in the right hilum and peribronchial region.  There is some degree of narrowing of pulmonary artery branches to the right middle lobe as a result.  This raises concern for underlying malignancy; this could also reflect a systemic condition such as sarcoidosis, though considered less likely given asymmetric hilar lymphadenopathy.  Several of the mediastinal nodes would be amenable to transbronchial biopsy, as deemed clinically appropriate. 3.  Mild bilateral dependent subsegmental atelectasis noted; lungs otherwise clear.  Original Report Authenticated By: Tonia Ghent, M.D.   We'll have patient admitted to the hospitalist service. There is concern the patient's breast cancer may have returned and will be responsible for the patient's thoracic lymphadenopathy.    Date: 12/31/2011 12:40 AM  Rate: 62  Rhythm: normal sinus rhythm with sinus arrhythmia  QRS Axis: normal  Intervals: normal  ST/T Wave abnormalities: normal  Conduction Disutrbances: none  Narrative Interpretation: unremarkable  Comparison with previous EKG: unchanged             Hanley Seamen, MD 12/31/11 [phone]

## 2011-12-31 NOTE — Progress Notes (Addendum)
Patient complaining of more chest pain, left sided this afternoon after eating a sub from [employer].  Dr. Ashley Royalty paged and notified.  Orders received for GI cocktail, given to patient without relief.  Patient stated Ibuprofen helped some earlier.  Will try ibuprofen again.  Will continue to monitor.  Colman Cater  Patient does state pain is better while sitting up and gets worse when she is lying down.  Pain medication administered.  Will continue to monitor.  Colman Cater

## 2011-12-31 NOTE — Consult Note (Signed)
Referral appreciated.  Interviewed and examined.  Full note to follow.  Suspect reactive adenopathy, possible sarcoidosis, less likely malignancy. Will schedule EBUS-TBNA. May discharge when stable.  Will schedule as outpatient.   Orlean Bradford, M.D., F.C.C.P. Pulmonary and Critical Care Medicine [facility] Cell: [phone] Pager: [phone]

## 2011-12-31 NOTE — ED Notes (Signed)
Patient ambulated to bathroom independently. Gait steady.

## 2012-01-01 ENCOUNTER — Observation Stay (HOSPITAL_COMMUNITY): Payer: Self-pay

## 2012-01-01 DIAGNOSIS — I517 Cardiomegaly: Secondary | ICD-10-CM

## 2012-01-01 DIAGNOSIS — J45909 Unspecified asthma, uncomplicated: Secondary | ICD-10-CM

## 2012-01-01 DIAGNOSIS — R001 Bradycardia, unspecified: Secondary | ICD-10-CM | POA: Diagnosis present

## 2012-01-01 LAB — CARDIAC PANEL(CRET KIN+CKTOT+MB+TROPI): Total CK: 96 U/L (ref 7–177)

## 2012-01-01 LAB — TSH: TSH: 1.333 u[IU]/mL (ref 0.350–4.500)

## 2012-01-01 LAB — BASIC METABOLIC PANEL
CO2: 23 mEq/L (ref 19–32)
Chloride: 102 mEq/L (ref 96–112)
Sodium: 136 mEq/L (ref 135–145)

## 2012-01-01 MED ORDER — GADOBENATE DIMEGLUMINE 529 MG/ML IV SOLN
20.0000 mL | Freq: Once | INTRAVENOUS | Status: AC | PRN
  Administered 2012-01-01: 20 mL via INTRAVENOUS

## 2012-01-01 MED ORDER — LORAZEPAM 2 MG/ML IJ SOLN
INTRAMUSCULAR | Status: AC
  Filled 2012-01-01: qty 1

## 2012-01-01 MED ORDER — LORAZEPAM 2 MG/ML IJ SOLN
0.5000 mg | Freq: Once | INTRAMUSCULAR | Status: AC
  Administered 2012-01-01: 0.5 mg via INTRAVENOUS

## 2012-01-01 NOTE — Progress Notes (Signed)
[facility] PROGRESS NOTE  Chelsea Hansen RUE:[id number] DOB: 09/04/1970 DOA: 12/31/2011 PCP: [name], MD  Assessment/Plan: Principal Problem: Syncope Active Problems:  Mediastinal adenopathy  Anemia  Chest pain Bradycardia  1. Syncope: Unclear etiology. Continue to monitor on telemetry.Orthostatic blood pressures negative.  2-D echo pending and carotid Dopplers negative. Ordered MRI of the head. 2. Chest pain: Likely musculoskeletal in etiology. Reproducible to palpation. Trial of NSAIDs. Enzymes negative. CTA chest negative for PE. EKG without acute changes.. 3. Mediastinal and hilar lymphadenopathy: Pulmonology has been consulted and plan to followup as outpatient  4. Microcytic anemia:  Will need outpatient followup. 5. Hypokalemia: Stable  6. Hypertension: Reasonably controlled of medications. Monitor. 7. History of bronchial asthma: Stable. 8. 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 1.1 cm lucency within the right frontal calvarium, nonspecific in appearance on CT head: Incidental finding: MRI of the head ordered. 9. Bradycardia is not sustained. Patient had periods of being in the 70s and 80s. Patient can followup outpatient with PCP/cardiology. Code Status: Full Family Communication:  Disposition Plan: If patient stable and remainder of workup negative then possible discharge on 7/14.  Brief narrative: 41 year old African American female patient with history of hypertension, asthma, right breast lumpectomy for fibroadenoma, admitted with a syncopal episode last night (witnessed) and 3 days history of lower substernal chest pain.  Consultants:  [facility]monology  Procedures:  None  Antibiotics:  None  HPI/Subjective: Patient denies prior history of passing out. She apparently was sitting on her bed and talking to her daughter and passed out at approximately 10 PM without any premonitory symptoms. She cannot recollect the duration  that she was out but on waking up denies any asymmetrical limb weakness or slurred speech or facial asymmetry. No reported seizure-like activity. No urinary incontinence. No tongue biting or frothing mouth. She does complain of slight dizziness on standing.  Shecomplaints of continued chest pain   Objective: Filed Vitals:   12/31/11 1430 12/31/11 1630 12/31/11 2100 01/01/12 0500  BP: 132/85 134/84 137/82 117/73  Pulse: 57 49 53 50  Temp: 98.3 F (36.8 C) 98.3 F (36.8 C) 99.1 F (37.3 C) 98.3 F (36.8 C)  TempSrc: Oral Oral Oral Oral  Resp: 20 20 20 20   Height:      Weight:      SpO2: 99% 99% 97% 100%   Exam: General exam: Comfortable. Respiratory system: Clear to auscultation. Reproducible lower substernal tenderness.  Cardiovascular system: regular rate and rhythm. No murmurs, JVD or pedal edema. Telemetry shows sinus rhythm. Gastrointestinal system: Abdomen is nondistended, soft and nontender with normal bowel sounds. Central nervous system: Alert and oriented. No focal neurological deficits. Extremities: Symmetric 5 x 5 power.   Data Reviewed: Basic Metabolic Panel:  Lab 01/01/12 1478 12/31/11 0910 12/31/11 0254  NA 136 139 139  K 3.8 3.3* 3.3*  CL 102 102 104  CO2 23 25 25   GLUCOSE 93 91 108*  BUN 13 12 15   CREATININE 0.81 0.83 0.94  CALCIUM 9.0 9.4 9.2  MG -- 1.9 --  PHOS -- -- --   Liver Function Tests:  Lab 12/31/11 0910  AST 17  ALT 19  ALKPHOS 74  BILITOT 0.3  PROT 7.2  ALBUMIN 3.6    Lab 12/31/11 0910  LIPASE 29  AMYLASE --    CBC:  Lab 12/31/11 0910 12/31/11 0254  WBC 6.1 7.4  NEUTROABS 3.4 4.1  HGB 11.1* 11.5*  HCT 34.3* 34.8*  MCV 72.4* 71.9*  PLT 225 226  Cardiac Enzymes:  Lab 01/01/12 0009 12/31/11 1638 12/31/11 0910 12/31/11 0721  CKTOTAL 96 126 145 142  CKMB 1.2 1.7 1.7 1.8  CKMBINDEX -- -- -- --  TROPONINI <0.30 <0.30 <0.30 <0.30    EKG: Sinus rhythm at 62 beats per minute, normal axis,? Peaked T waves but otherwise  no acute changes. QTC 410 ms.  Studies: Dg Chest 2 View  12/31/2011  *RADIOLOGY REPORT*  Clinical Data: Chest pain and shortness of breath.  CHEST - 2 VIEW  Comparison: Chest radiograph performed 11/18/2010  Findings: The lungs are well-aerated and clear.  There is no evidence of focal opacification, pleural effusion or pneumothorax.  The heart is normal in size; the mediastinal contour is within normal limits.  No acute osseous abnormalities are seen.  IMPRESSION: No acute cardiopulmonary process seen.  Original Report Authenticated By: Tonia Ghent, M.D.   Ct Head Wo Contrast  12/31/2011  *RADIOLOGY REPORT*  Clinical Data: Period of unresponsiveness; history of breast cancer.  CT HEAD WITHOUT CONTRAST  Technique:  Contiguous axial images were obtained from the base of the skull through the vertex without contrast.  Comparison: None.  Findings: There is no evidence of acute infarction, mass lesion, or intra- or extra-axial hemorrhage on CT.  The posterior fossa, including the cerebellum, brainstem and fourth ventricle, is within normal limits.  The third and lateral ventricles, and basal ganglia are unremarkable in appearance.  The cerebral hemispheres are symmetric in appearance, with normal gray- white differentiation.  No mass effect or midline shift is seen.  There is no evidence of fracture; a small 1.1 cm lucency within the right frontal calvarium is nonspecific in appearance.  The orbits are within normal limits.  The paranasal sinuses and mastoid air cells are well-aerated.  No significant soft tissue abnormalities are seen.  IMPRESSION:  1.  No acute intracranial pathology seen on CT. 2.  1.1 cm lucency within the right frontal calvarium, nonspecific in appearance.  Depending on the degree of clinical concern, bone scan could be considered for further evaluation.  Original Report Authenticated By: Tonia Ghent, M.D.   Ct Angio Chest W/cm &/or Wo Cm  12/31/2011  *RADIOLOGY REPORT*  Clinical  Data: Left-sided chest pain; elevated D-dimer.  CT ANGIOGRAPHY CHEST  Technique:  Multidetector CT imaging of the chest using the standard protocol during bolus administration of intravenous contrast. Multiplanar reconstructed images including MIPs were obtained and reviewed to evaluate the vascular anatomy.  Contrast: OMNIPAQUE IOHEXOL 350 MG/ML SOLN  Comparison: Chest radiograph performed earlier today at 03:11 a.m.  Findings: There is no evidence of pulmonary embolus.  Mild bilateral dependent subsegmental atelectasis is noted.  The lungs are otherwise clear.  There is no evidence of significant focal consolidation, pleural effusion or pneumothorax.  No masses are identified; no abnormal focal contrast enhancement is seen.  Significantly enlarged mediastinal and right hilar nodes are seen. The largest node measures 2.4 cm in short axis, in the right paratracheal region.  Additional enlarged precarinal, subcarinal, azygoesophageal recess, aortopulmonary window and periaortic nodes are seen.  Enlarged right hilar nodes measure up to 1.4 cm in size, and an enlarged 1.9 cm right peribronchial node is seen.  Additional enlarged right peribronchial nodes are also noted. These cause some degree of narrowing of pulmonary artery branches to the right middle lobe.  A borderline normal 1.0 cm left hilar node is noted.  No pericardial effusion is identified.  The great vessels are grossly unremarkable in appearance.  No axillary lymphadenopathy is  seen.  The visualized portions of the thyroid gland are unremarkable in appearance.  The visualized portions of the liver and spleen are unremarkable. The visualized portions of the pancreas, gallbladder, stomach, adrenal glands and kidneys are within normal limits.  No acute osseous abnormalities are seen.  IMPRESSION: . 1.  No evidence of pulmonary embolus. 2.  Significantly enlarged mediastinal and right hilar nodes seen, measuring up to 2.4 cm in the mediastinum and up to  1.9 cm in the right hilum and peribronchial region.  There is some degree of narrowing of pulmonary artery branches to the right middle lobe as a result.  This raises concern for underlying malignancy; this could also reflect a systemic condition such as sarcoidosis, though considered less likely given asymmetric hilar lymphadenopathy.  Several of the mediastinal nodes would be amenable to transbronchial biopsy, as deemed clinically appropriate. 3.  Mild bilateral dependent subsegmental atelectasis noted; lungs otherwise clear.  Original Report Authenticated By: Tonia Ghent, M.D.    Scheduled Meds:    . sodium chloride   Intravenous STAT  . gi cocktail  30 mL Oral Once  . sodium chloride  3 mL Intravenous Q12H   Continuous Infusions:     Chelsea Hansen [facility] Pager [phone] If 7PM-7AM, please contact night-coverage [email or web address] Password [email or web address] 01/01/2012, 12:38 PM   LOS: 1 day

## 2012-01-01 NOTE — Plan of Care (Signed)
Problem: Phase II Progression Outcomes Goal: Anginal pain relieved Outcome: Completed/Met Date Met:  01/01/12 Chest pain relieved with Norco.

## 2012-01-01 NOTE — Progress Notes (Signed)
*  PRELIMINARY RESULTS* Echocardiogram 2D Echocardiogram has been performed.  Jeryl Columbia 01/01/2012, 11:00 AM

## 2012-01-02 DIAGNOSIS — I498 Other specified cardiac arrhythmias: Secondary | ICD-10-CM

## 2012-01-02 DIAGNOSIS — E876 Hypokalemia: Secondary | ICD-10-CM | POA: Diagnosis present

## 2012-01-02 LAB — CBC
HCT: 36.1 % (ref 36.0–46.0)
MCV: 71.8 fL — ABNORMAL LOW (ref 78.0–100.0)
RBC: 5.03 MIL/uL (ref 3.87–5.11)
WBC: 7.5 10*3/uL (ref 4.0–10.5)

## 2012-01-02 LAB — BASIC METABOLIC PANEL
BUN: 15 mg/dL (ref 6–23)
CO2: 23 mEq/L (ref 19–32)
Chloride: 101 mEq/L (ref 96–112)
Creatinine, Ser: 0.95 mg/dL (ref 0.50–1.10)

## 2012-01-02 MED ORDER — IBUPROFEN 600 MG PO TABS: 600.0000 mg | ORAL_TABLET | Freq: Four times a day (QID) | ORAL | Status: AC | PRN

## 2012-01-02 MED ORDER — HYDROCODONE-ACETAMINOPHEN 5-325 MG PO TABS: 1.0000 | ORAL_TABLET | ORAL | Status: AC | PRN

## 2012-01-02 NOTE — Discharge Summary (Signed)
Physician Discharge Summary  Chelsea Hansen ZOX:[id number] DOB: Nov 03, 1970 DOA: 12/31/2011  PCP: [name], MD  Admit date: 12/31/2011 Discharge date: 01/02/2012  Recommendations for Outpatient Follow-up:   Follow-up Information    Follow up with Primary Doctor in 1 day.      Schedule an appointment as soon as possible for a visit with Lonia Farber, MD.   Contact information:   [address] [place] [address] [phone]          Discharge Diagnoses:   Syncope  Mediastinal adenopathy  Anemia  Chest pain  Asthma  Bradycardia  Hypokalemia   Discharge Condition: Stable Disposition: Home Diet recommendation: Heart healthy diet  History of present illness:   41 year-old female with history of hypertension and asthma and previous history of right-sided breast lumpectomy (pathology results show fibroadenoma) was brought to the ER after patient's daughter witnessed the patient having a syncopal episode last night while talking to her. Patient started to talk to the ER physician stated that she may have lost consciousness for around 5-10 minutes and was limp. Patient did not have any focal deficits prior to or after the incident. Patient has been experiencing chest pain for last 3 days. The chest pain is retrosternal lower half of the sternum stabbing in nature nonradiating present even at rest increased on inspiration and no associated shortness of breath. Patient denies any cough fever chills. In the ER patient had a CT head which the/at the acute except for nonspecific lucency in the calvarium and had been advised to get bone scan. CT angiogram of the chest showed significant right hilar and mediastinal lymphadenopathy. Patient has been admitted for further management   Hospital Course:  1. Syncope: Unclear etiology. Unclear of the etiology of her syncopal episode. Patient had echocardiogram done that was normal she had CT of the head done  that did not show any acute stroke carotid Dopplers did not show any blockage an MRI of the head did not show any acute finding. Patient will followup with her primary care physician. Patient was noted to have bradycardia heart rates in the 40s and 50s at rest and with activity heart rate goes up into the 80s. If patient has another episode which most likely she will need to see a cardiologist not for the heart rate is played any role. She was not noted to have any seizure episode. Will defer any further workup the patient's primary care physician.  2. Chest pain: Likely musculoskeletal in etiology. Reproducible to palpation. Trial of NSAIDs. Enzymes negative. CTA chest negative for PE. EKG without acute changes.. 3. Mediastinal and hilar lymphadenopathy: Pulmonology was consulted regarding lymphadenopathy. Patient will be contacted by pulmonary to schedule biopsy outpatient 4. Microcytic anemia: Patient to follow up with primary care Dr. for further work  5. Hypokalemia: Stable at the time of discharge 6. Hypertension: Reasonably controlled of medications.  7. History of bronchial asthma: Stable throughout this admission. 8. 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 9. Bradycardia is not sustained. Patient had periods of being in the 70s and 80s. Patient can followup outpatient with PCP/cardiology.    Discharge Instructions  Discharge Orders    Future Orders Please Complete By Expires   Diet - low sodium heart healthy        Medication List  As of 01/02/2012  9:34 AM   STOP taking these medications         acetaminophen 500 MG tablet  TAKE these medications         HYDROcodone-acetaminophen 5-325 MG per tablet   Commonly known as: NORCO   Take 1 tablet by mouth every 4 (four) hours as needed.      ibuprofen 600 MG tablet   Commonly known as: ADVIL,MOTRIN   Take 1 tablet (600 mg total) by mouth every 6 (six) hours as needed.           Follow-up Information     Follow up with Primary Doctor in 1 day.      Schedule an appointment as soon as possible for a visit with Lonia Farber, MD.   Contact information:   [address] [place] [address] [phone]           The results of significant diagnostics from this hospitalization (including imaging, microbiology, ancillary and laboratory) are listed below for reference.    Significant Diagnostic Studies: Dg Chest 2 View  12/31/2011  *RADIOLOGY REPORT*  Clinical Data: Chest pain and shortness of breath.  CHEST - 2 VIEW  Comparison: Chest radiograph performed 11/18/2010  Findings: The lungs are well-aerated and clear.  There is no evidence of focal opacification, pleural effusion or pneumothorax.  The heart is normal in size; the mediastinal contour is within normal limits.  No acute osseous abnormalities are seen.  IMPRESSION: No acute cardiopulmonary process seen.  Original Report Authenticated By: Tonia Ghent, M.D.   Ct Head Wo Contrast  12/31/2011  *RADIOLOGY REPORT*  Clinical Data: Period of unresponsiveness; history of breast cancer.  CT HEAD WITHOUT CONTRAST  Technique:  Contiguous axial images were obtained from the base of the skull through the vertex without contrast.  Comparison: None.  Findings: There is no evidence of acute infarction, mass lesion, or intra- or extra-axial hemorrhage on CT.  The posterior fossa, including the cerebellum, brainstem and fourth ventricle, is within normal limits.  The third and lateral ventricles, and basal ganglia are unremarkable in appearance.  The cerebral hemispheres are symmetric in appearance, with normal gray- white differentiation.  No mass effect or midline shift is seen.  There is no evidence of fracture; a small 1.1 cm lucency within the right frontal calvarium is nonspecific in appearance.  The orbits are within normal limits.  The paranasal sinuses and mastoid air cells are well-aerated.  No significant soft  tissue abnormalities are seen.  IMPRESSION:  1.  No acute intracranial pathology seen on CT. 2.  1.1 cm lucency within the right frontal calvarium, nonspecific in appearance.  Depending on the degree of clinical concern, bone scan could be considered for further evaluation.  Original Report Authenticated By: Tonia Ghent, M.D.   Ct Angio Chest W/cm &/or Wo Cm  12/31/2011  *RADIOLOGY REPORT*  Clinical Data: Left-sided chest pain; elevated D-dimer.  CT ANGIOGRAPHY CHEST  Technique:  Multidetector CT imaging of the chest using the standard protocol during bolus administration of intravenous contrast. Multiplanar reconstructed images including MIPs were obtained and reviewed to evaluate the vascular anatomy.  Contrast: OMNIPAQUE IOHEXOL 350 MG/ML SOLN  Comparison: Chest radiograph performed earlier today at 03:11 a.m.  Findings: There is no evidence of pulmonary embolus.  Mild bilateral dependent subsegmental atelectasis is noted.  The lungs are otherwise clear.  There is no evidence of significant focal consolidation, pleural effusion or pneumothorax.  No masses are identified; no abnormal focal contrast enhancement is seen.  Significantly enlarged mediastinal and right hilar nodes are seen. The largest node measures 2.4 cm in  short axis, in the right paratracheal region.  Additional enlarged precarinal, subcarinal, azygoesophageal recess, aortopulmonary window and periaortic nodes are seen.  Enlarged right hilar nodes measure up to 1.4 cm in size, and an enlarged 1.9 cm right peribronchial node is seen.  Additional enlarged right peribronchial nodes are also noted. These cause some degree of narrowing of pulmonary artery branches to the right middle lobe.  A borderline normal 1.0 cm left hilar node is noted.  No pericardial effusion is identified.  The great vessels are grossly unremarkable in appearance.  No axillary lymphadenopathy is seen.  The visualized portions of the thyroid gland are unremarkable in  appearance.  The visualized portions of the liver and spleen are unremarkable. The visualized portions of the pancreas, gallbladder, stomach, adrenal glands and kidneys are within normal limits.  No acute osseous abnormalities are seen.  IMPRESSION: . 1.  No evidence of pulmonary embolus. 2.  Significantly enlarged mediastinal and right hilar nodes seen, measuring up to 2.4 cm in the mediastinum and up to 1.9 cm in the right hilum and peribronchial region.  There is some degree of narrowing of pulmonary artery branches to the right middle lobe as a result.  This raises concern for underlying malignancy; this could also reflect a systemic condition such as sarcoidosis, though considered less likely given asymmetric hilar lymphadenopathy.  Several of the mediastinal nodes would be amenable to transbronchial biopsy, as deemed clinically appropriate. 3.  Mild bilateral dependent subsegmental atelectasis noted; lungs otherwise clear.  Original Report Authenticated By: Tonia Ghent, M.D.   Mr Laqueta Jean VW Contrast  01/01/2012  *RADIOLOGY REPORT*  Clinical Data: Syncope.  History of breast cancer.  MRI HEAD WITHOUT AND WITH CONTRAST  Technique:  Multiplanar, multiecho pulse sequences of the brain and surrounding structures were obtained according to standard protocol without and with intravenous contrast  Contrast: 20mL MULTIHANCE GADOBENATE DIMEGLUMINE 529 MG/ML IV SOLN  Comparison: CT head 12/31/2011  Findings:  There is no evidence for acute infarction, intracranial hemorrhage, mass lesion, hydrocephalus, or extra-axial fluid. There is no significant atrophy.  Mild scattered foci of increased signal in the periventricular and subcortical white matter could represent chronic microvascular ischemic change.  The major intracranial vascular structures are patent.  Post infusion, there is no abnormal intracranial enhancement. Suspected small intraparotid lymph node on the left superiorly.  Small air-fluid level right  maxillary sinus.  Focal lucency on CT scanning is observed on today's MR follow-up as an area of T1 shortening without clear-cut enhancement.  The lesion involves the diploic space of the right frontal bone with scalloping of the inner table.  No worrisome areas of calvarial abnormality are seen elsewhere.  This likely represents an incidental lipoma. CT Hounsfield unit attenuation in the -40-50 range also correlates with the suspicion of lipoma.  IMPRESSION: No acute intracranial findings.  No evidence for metastatic disease.  Mild chronic microvascular ischemic change.  Focal calvarial lucency in the right frontal bone noted on CT has characteristics of fat on MR with no post contrast enhancement. 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  No foci suspicious for osseous metastatic disease are observed.  Original Report Authenticated By: Elsie Stain, M.D.   Nm Bone Scan Whole Body  12/31/2011  *RADIOLOGY REPORT*  Clinical Data: Breast cancer, right frontal calvarial lucency  NUCLEAR MEDICINE WHOLE BODY BONE SCINTIGRAPHY  Technique:  Whole body anterior and posterior images were obtained approximately 3 hours after intravenous injection of radiopharmaceutical.  Radiopharmaceutical: CURIE TC-MDP TECHNETIUM  TC 71M MEDRONATE IV KIT  Comparison: None Correlation:  CT head 12/31/2011  Findings: Single focus of nonspecific increased tracer localization at the skull vertex on the whole body images does not persist on the lateral views of the calvarium, of doubtful significance. Otherwise normal diffuse osseous accumulation of tracer throughout the appendicular and axial skeleton. No definite focal areas of increased or decreased tracer localization identified. Urinary tract and soft tissue  distribution of tracer is normal.  IMPRESSION: No focal areas of abnormal osseous tracer localization identified.  Original Report Authenticated By: Lollie Marrow, M.D.   Echocardiogram Left ventricle: The  cavity size was normal. Wall thicknesswas increased in a pattern of mild LVH. The estimated ejection fraction was 60%. Wall motion was normal; there were no regional wall motion abnormalities.     Microbiology: No results found for this or any previous visit (from the past 240 hour(s)).   Labs: Basic Metabolic Panel:  Lab 01/02/12 1610 01/01/12 0618 12/31/11 0910 12/31/11 0254  NA 135 136 139 139  K 4.1 3.8 3.3* 3.3*  CL 101 102 102 104  CO2 23 23 25 25   GLUCOSE 83 93 91 108*  BUN 15 13 12 15   CREATININE 0.95 0.81 0.83 0.94  CALCIUM 9.1 9.0 9.4 9.2  MG -- -- 1.9 --  PHOS -- -- -- --   Liver Function Tests:  Lab 12/31/11 0910  AST 17  ALT 19  ALKPHOS 74  BILITOT 0.3  PROT 7.2  ALBUMIN 3.6    Lab 12/31/11 0910  LIPASE 29  AMYLASE --   No results found for this basename: AMMONIA:5 in the last 168 hours CBC:  Lab 01/02/12 0450 12/31/11 0910 12/31/11 0254  WBC 7.5 6.1 7.4  NEUTROABS -- 3.4 4.1  HGB 11.5* 11.1* 11.5*  HCT 36.1 34.3* 34.8*  MCV 71.8* 72.4* 71.9*  PLT 245 225 226   Cardiac Enzymes:  Lab 01/01/12 0009 12/31/11 1638 12/31/11 0910 12/31/11 0721  CKTOTAL 96 126 145 142  CKMB 1.2 1.7 1.7 1.8  CKMBINDEX -- -- -- --  TROPONINI <0.30 <0.30 <0.30 <0.30    Time coordinating discharge: 50 minutes Signed:  Molli Posey, MD [facility] 01/02/2012, 9:34 AM

## 2012-01-04 LAB — UIFE/LIGHT CHAINS/TP QN, 24-HR UR
Alpha 2, Urine: DETECTED — AB
Free Kappa Lt Chains,Ur: 3.83 mg/dL — ABNORMAL HIGH (ref 0.14–2.42)
Free Kappa/Lambda Ratio: 10.08 ratio (ref 2.04–10.37)
Total Protein, Urine: 4.7 mg/dL

## 2012-01-04 LAB — PROTEIN ELECTROPHORESIS, SERUM
Albumin ELP: 56.4 % (ref 55.8–66.1)
Beta 2: 4.6 % (ref 3.2–6.5)
Total Protein ELP: 7 g/dL (ref 6.0–8.3)

## 2012-01-06 ENCOUNTER — Encounter (HOSPITAL_COMMUNITY): Payer: Self-pay | Admitting: Pharmacist

## 2012-01-06 ENCOUNTER — Telehealth: Payer: Self-pay | Admitting: Pulmonary Disease

## 2012-01-06 NOTE — Telephone Encounter (Signed)
I spoke with the pt and she is confused about when she is supposed to be where. She states that she was told something about an appt on Monday 01-10-12 and an appt on 01-14-12. I advised it looks like there is a EBUS set for Monday and an appt to see Dr. Herma Carson on Friday 01-14-12. Pt states that she did not know about the appt on Monday and is confused about what to do. I advised the pt I will call Dr. Herma Carson and clarify and let the pt know. Pt states understanding.  I spoke with Dr. Herma Carson and he states the pt needs to have procedure on Monday, but does not need to see him on Friday 01-14-12. Pt aware OR will contact her with instructions.Carron Curie, CMA

## 2012-01-06 NOTE — Telephone Encounter (Signed)
Duplicate message. Jennifer Castillo, CMA  

## 2012-01-06 NOTE — Telephone Encounter (Signed)
[email or web address]

## 2012-01-07 ENCOUNTER — Encounter (HOSPITAL_COMMUNITY): Payer: Self-pay | Admitting: *Deleted

## 2012-01-07 MED ORDER — MUPIROCIN 2 % EX OINT: TOPICAL_OINTMENT | Freq: Two times a day (BID) | CUTANEOUS | Status: DC

## 2012-01-07 NOTE — Consult Note (Signed)
Anesthesia Chart Review:  Patient is a 42 year old female scheduled for video bronchoscopy to evaluate newly found hilar/mediastinal lymphadenopathy during a hospitalization (12/31/11-01/02/12) for syncope  and "musculoskeletal" chest pain .  No definite etiology was found for her syncope.  Work-up include CT/MRI of the head, CT chest, echo, carotid duplex, EKG.    Other history included HTN, asthma, non-smoker, hysterectomy, breast surgery for fibroadenoma.  EKG on 12/31/11 showed SB @ 51.  Echo on 01/01/12 showed: Left ventricle: The cavity size was normal. Wall thickness was increased in a pattern of mild LVH. The estimated ejection fraction was 60%. Wall motion was normal; there were no regional wall motion abnormalities.  CXR on 12/31/11 showed No acute cardiopulmonary process seen.  Chest CT on 12/31/11 showed: 1. No evidence of pulmonary embolus.  2. Significantly enlarged mediastinal and right hilar nodes seen, measuring up to 2.4 cm in the mediastinum and up to 1.9 cm in the right hilum and peribronchial region. There is some degree of narrowing of pulmonary artery branches to the right middle lobe as a result.  3. Mild bilateral dependent subsegmental atelectasis noted; lungs otherwise clear.   Carotid dopplers on 12/31/11 showed no obvioussignificant extracranial carotid artery stenosis demonstrated. Vertebrals are patent with antegrade flow.  Labs from 01/02/12 noted.  She is scheduled to be a same day work-up.  Her Anesthesiologist will evaluate her on the day of surgery.  Anesthesiologist Dr. Krista Blue updated on her recent hospitalization.  If no significant change in her status then plan to proceed.  Shonna Chock, PA-C

## 2012-01-10 ENCOUNTER — Ambulatory Visit (HOSPITAL_COMMUNITY): Payer: Self-pay

## 2012-01-10 ENCOUNTER — Encounter (HOSPITAL_COMMUNITY)
Admission: RE | Disposition: A | Discharge status: Home / Self Care | Payer: Self-pay | Source: Ambulatory Visit | Attending: Pulmonary Disease

## 2012-01-10 ENCOUNTER — Encounter (HOSPITAL_COMMUNITY): Payer: Self-pay | Admitting: Vascular Surgery

## 2012-01-10 ENCOUNTER — Encounter (HOSPITAL_COMMUNITY): Payer: Self-pay | Admitting: *Deleted

## 2012-01-10 ENCOUNTER — Ambulatory Visit (HOSPITAL_COMMUNITY)
Admission: RE | Admit: 2012-01-10 | Discharge: 2012-01-10 | Disposition: A | Discharge status: Home / Self Care | Payer: Self-pay | Source: Ambulatory Visit | Attending: Pulmonary Disease | Admitting: Pulmonary Disease

## 2012-01-10 ENCOUNTER — Ambulatory Visit (HOSPITAL_COMMUNITY): Payer: Self-pay | Admitting: Vascular Surgery

## 2012-01-10 DIAGNOSIS — R599 Enlarged lymph nodes, unspecified: Secondary | ICD-10-CM | POA: Insufficient documentation

## 2012-01-10 DIAGNOSIS — I10 Essential (primary) hypertension: Secondary | ICD-10-CM | POA: Insufficient documentation

## 2012-01-10 DIAGNOSIS — J45909 Unspecified asthma, uncomplicated: Secondary | ICD-10-CM | POA: Insufficient documentation

## 2012-01-10 HISTORY — DX: Headache: R51

## 2012-01-10 HISTORY — DX: Malignant (primary) neoplasm, unspecified: C80.1

## 2012-01-10 LAB — COMPREHENSIVE METABOLIC PANEL
AST: 20 U/L (ref 0–37)
BUN: 11 mg/dL (ref 6–23)
CO2: 23 mEq/L (ref 19–32)
Calcium: 9.2 mg/dL (ref 8.4–10.5)
Creatinine, Ser: 0.85 mg/dL (ref 0.50–1.10)
GFR calc Af Amer: >90 mL/min (ref >90)
GFR calc non Af Amer: 85 mL/min — ABNORMAL LOW (ref >90)

## 2012-01-10 LAB — CBC
Hemoglobin: 12 g/dL (ref 12.0–15.0)
Platelets: 280 10*3/uL (ref 150–400)
RBC: 5.1 MIL/uL (ref 3.87–5.11)
WBC: 5.8 10*3/uL (ref 4.0–10.5)

## 2012-01-10 LAB — APTT: aPTT: 32 seconds (ref 24–37)

## 2012-01-10 LAB — PROTIME-INR
INR: 1.1 (ref 0.00–1.49)
Prothrombin Time: 14.4 seconds (ref 11.6–15.2)

## 2012-01-10 SURGERY — BRONCHOSCOPY, WITH EBUS
Anesthesia: General | Wound class: Clean Contaminated

## 2012-01-10 MED ORDER — NEOSTIGMINE METHYLSULFATE 1 MG/ML IJ SOLN
INTRAMUSCULAR | Status: DC | PRN
  Administered 2012-01-10: 4 mg via INTRAVENOUS

## 2012-01-10 MED ORDER — 0.9 % SODIUM CHLORIDE (POUR BTL) OPTIME
TOPICAL | Status: DC | PRN
  Administered 2012-01-10: 1000 mL

## 2012-01-10 MED ORDER — PROMETHAZINE HCL 25 MG/ML IJ SOLN: 6.2500 mg | INTRAMUSCULAR | Status: DC | PRN

## 2012-01-10 MED ORDER — LACTATED RINGERS IV SOLN
INTRAVENOUS | Status: DC
  Administered 2012-01-10: 10:00:00 via INTRAVENOUS

## 2012-01-10 MED ORDER — ARTIFICIAL TEARS OP OINT
TOPICAL_OINTMENT | OPHTHALMIC | Status: DC | PRN
  Administered 2012-01-10: 1 via OPHTHALMIC

## 2012-01-10 MED ORDER — DROPERIDOL 2.5 MG/ML IJ SOLN
INTRAMUSCULAR | Status: DC | PRN
  Administered 2012-01-10: 0.625 mg via INTRAVENOUS

## 2012-01-10 MED ORDER — PROPOFOL 10 MG/ML IV EMUL
INTRAVENOUS | Status: DC | PRN
  Administered 2012-01-10: 40 mg via INTRAVENOUS
  Administered 2012-01-10: 180 mg via INTRAVENOUS

## 2012-01-10 MED ORDER — MEPERIDINE HCL 25 MG/ML IJ SOLN: 6.2500 mg | INTRAMUSCULAR | Status: DC | PRN

## 2012-01-10 MED ORDER — LACTATED RINGERS IV SOLN
INTRAVENOUS | Status: DC | PRN
  Administered 2012-01-10: 11:00:00 via INTRAVENOUS

## 2012-01-10 MED ORDER — LACTATED RINGERS IV SOLN: INTRAVENOUS | Status: DC

## 2012-01-10 MED ORDER — ALBUTEROL SULFATE HFA 108 (90 BASE) MCG/ACT IN AERS
INHALATION_SPRAY | RESPIRATORY_TRACT | Status: DC | PRN
  Administered 2012-01-10: 2 via RESPIRATORY_TRACT

## 2012-01-10 MED ORDER — FENTANYL CITRATE 0.05 MG/ML IJ SOLN: 25.0000 ug | INTRAMUSCULAR | Status: DC | PRN

## 2012-01-10 MED ORDER — ONDANSETRON HCL 4 MG/2ML IJ SOLN
INTRAMUSCULAR | Status: DC | PRN
  Administered 2012-01-10 (×2): 4 mg via INTRAVENOUS

## 2012-01-10 MED ORDER — ROCURONIUM BROMIDE 100 MG/10ML IV SOLN
INTRAVENOUS | Status: DC | PRN
  Administered 2012-01-10: 35 mg via INTRAVENOUS

## 2012-01-10 MED ORDER — LIDOCAINE HCL 4 % MT SOLN
OROMUCOSAL | Status: DC | PRN
  Administered 2012-01-10: 4 mL via TOPICAL

## 2012-01-10 MED ORDER — LIDOCAINE HCL (CARDIAC) 20 MG/ML IV SOLN
INTRAVENOUS | Status: DC | PRN
  Administered 2012-01-10: 80 mg via INTRAVENOUS

## 2012-01-10 MED ORDER — MIDAZOLAM HCL 5 MG/5ML IJ SOLN
INTRAMUSCULAR | Status: DC | PRN
  Administered 2012-01-10: 2 mg via INTRAVENOUS

## 2012-01-10 MED ORDER — DEXAMETHASONE SODIUM PHOSPHATE 4 MG/ML IJ SOLN
INTRAMUSCULAR | Status: DC | PRN
  Administered 2012-01-10: 8 mg via INTRAVENOUS

## 2012-01-10 MED ORDER — FENTANYL CITRATE 0.05 MG/ML IJ SOLN
INTRAMUSCULAR | Status: DC | PRN
  Administered 2012-01-10 (×2): 50 ug via INTRAVENOUS

## 2012-01-10 MED ORDER — GLYCOPYRROLATE 0.2 MG/ML IJ SOLN
INTRAMUSCULAR | Status: DC | PRN
  Administered 2012-01-10: .8 mg via INTRAVENOUS

## 2012-01-10 SURGICAL SUPPLY — 23 items
BRUSH CYTOL CELLEBRITY 1.5X140 (MISCELLANEOUS) IMPLANT
CANISTER SUCTION 2500CC (MISCELLANEOUS) ×2 IMPLANT
CLOTH BEACON ORANGE TIMEOUT ST (SAFETY) ×2 IMPLANT
CONT SPEC 4OZ CLIKSEAL STRL BL (MISCELLANEOUS) ×2 IMPLANT
COVER TABLE BACK 60X90 (DRAPES) ×2 IMPLANT
FORCEPS BIOP RJ4 1.8 (CUTTING FORCEPS) ×1 IMPLANT
GLOVE SURG SIGNA 7.5 PF LTX (GLOVE) ×2 IMPLANT
KIT ROOM TURNOVER OR (KITS) ×2 IMPLANT
MARKER SKIN DUAL TIP RULER LAB (MISCELLANEOUS) ×2 IMPLANT
NDL BIOPSY TRANSBRONCH 21G (NEEDLE) IMPLANT
NEEDLE BIOPSY TRANSBRONCH 21G (NEEDLE) IMPLANT
NEEDLE SYS SONOTIP II EBUSTBNA (NEEDLE) IMPLANT
NS IRRIG 1000ML POUR BTL (IV SOLUTION) ×2 IMPLANT
OIL SILICONE PENTAX (PARTS (SERVICE/REPAIRS)) ×1 IMPLANT
PAD ARMBOARD 7.5X6 YLW CONV (MISCELLANEOUS) ×4 IMPLANT
SPONGE GAUZE 4X4 12PLY (GAUZE/BANDAGES/DRESSINGS) IMPLANT
SYR 20CC LL (SYRINGE) ×2 IMPLANT
SYR 20ML ECCENTRIC (SYRINGE) ×3 IMPLANT
SYR 5ML LUER SLIP (SYRINGE) ×2 IMPLANT
SYR TOOMEY 50ML (SYRINGE) ×2 IMPLANT
TOWEL OR 17X24 6PK STRL BLUE (TOWEL DISPOSABLE) ×2 IMPLANT
TRAP SPECIMEN MUCOUS 40CC (MISCELLANEOUS) ×2 IMPLANT
TUBE CONNECTING 12X1/4 (SUCTIONS) ×2 IMPLANT

## 2012-01-10 NOTE — Anesthesia Preprocedure Evaluation (Signed)
Anesthesia Evaluation  Patient identified by MRN, date of birth, ID band Patient awake    Reviewed: Allergy & Precautions, H&P , NPO status , Patient's Chart, lab work & pertinent test results  Airway Mallampati: I TM Distance: >3 FB Neck ROM: Full    Dental No notable dental hx.    Pulmonary asthma ,  Mediastinal lymphadenopathy. CT scan does not show any airway compromise. breath sounds clear to auscultation  Pulmonary exam normal       Cardiovascular hypertension, Pt. on medications Rhythm:Regular Rate:Normal     Neuro/Psych negative neurological ROS  negative psych ROS   GI/Hepatic negative GI ROS, Neg liver ROS,   Endo/Other  negative endocrine ROS  Renal/GU negative Renal ROS  negative genitourinary   Musculoskeletal negative musculoskeletal ROS (+)   Abdominal   Peds negative pediatric ROS (+)  Hematology negative hematology ROS (+)   Anesthesia Other Findings   Reproductive/Obstetrics negative OB ROS                           Anesthesia Physical Anesthesia Plan  ASA: II  Anesthesia Plan: General   Post-op Pain Management:    Induction: Intravenous  Airway Management Planned: Oral ETT  Additional Equipment:   Intra-op Plan:   Post-operative Plan: Extubation in OR  Informed Consent: I have reviewed the patients History and Physical, chart, labs and discussed the procedure including the risks, benefits and alternatives for the proposed anesthesia with the patient or authorized representative who has indicated his/her understanding and acceptance.   Dental advisory given  Plan Discussed with: CRNA  Anesthesia Plan Comments:         Anesthesia Quick Evaluation

## 2012-01-10 NOTE — Preoperative (Signed)
Beta Blockers   Reason not to administer Beta Blockers:Not Applicable 

## 2012-01-10 NOTE — Interval H&P Note (Signed)
Patient examined.  Records reviewed.  No changes since last seen.

## 2012-01-10 NOTE — Anesthesia Postprocedure Evaluation (Signed)
  Anesthesia Post-op Note  Patient: Chelsea Hansen  Procedure(s) Performed: Procedure(s) (LRB): VIDEO BRONCHOSCOPY WITH ENDOBRONCHIAL ULTRASOUND (N/A)  Patient Location: PACU  Anesthesia Type: General  Level of Consciousness: awake and alert   Airway and Oxygen Therapy: Patient Spontanous Breathing  Post-op Pain: mild  Post-op Assessment: Post-op Vital signs reviewed, Patient's Cardiovascular Status Stable, Respiratory Function Stable, Patent Airway and No signs of Nausea or vomiting  Post-op Vital Signs: stable  Complications: No apparent anesthesia complications  

## 2012-01-10 NOTE — Op Note (Signed)
Name:  [name] MRN:  [id number] DOB:  10/12/1970  OPERATIVE NOTE  Procedure(s): Flexible bronchoscopy [id number]) Transbronchial needle aspiration (19147) of station 7 LN Transbronchial needle aspiration, additional lobe (82956) of statin 10R LN Transbronchial needle aspiration, additional lobe (21308) of statin 4R LN Endobronchial biopsy (65784) random mucosal  Indications:  Hilar / mediastinal lymphadenopathy.  Consent:  Procedure, benefits, risks and alternatives discussed.  Questions answered.  Consent obtained.  Anesthesia:  General endotracheal.  Procedure summary:  Appropriate equipment was assembled.  The patient was brought to the operating room and identified as Chelsea Hansen.  Safety timeout was performed. The patient was placed supine on the operating table, airway established and general anesthesia administered by Anesthesia team.   After the appropriate level of anesthesia was assured, flexible video bronchoscope was lubricated and inserted through the endotracheal tube. Airway examination was performed bilaterally to subsegmental level.  Minimal clear secretions were noted, mucosa appeared normal and no endobronchial lesions were identified.  Endobronchial ultrasound video bronchoscope was then lubricated and inserted through the endotracheal tube. Surveillance of the mediastinal and and bilateral hilar lymph node stations was performed.  Pathologically enlarged lymph nodes were noted.  Endobronchial ultrasound guided transbronchial needle aspiration of station 7 LN (passes 1-3), station 10R LN (passes 4-5) and station 4R LN (passes 6-7) was performed, after which EBUS bronchoscope was withdrawn.  Samples were sent for intraoperative reading.  Additional samples were obtained from station 7 LN and sent for flow cytometry.  Flexible video bronchoscope was used again to perform random endobronchial mucosal biopsies.  After hemostasis was assure, the bronchoscope was  withdrawn.  The patient was extubated in operating room and transferred to PACU.  Specimens sent: TBNA samples of stations 7, 10R and 4R LN for cytology assessment TBNA sample of station 7 LN for flow cytometry Random mucosal biopsies for pathology assessment (in saline)  Complications:  No immediate complications were noted.  Hemodynamic parameters and oxygenation remained stable throughout the procedure.  Estimated blood loss:  Less then 5 mL.  Orlean Bradford, M.D. Pulmonary and Critical Care Medicine [facility] Cell: [phone]  01/10/2012, 2:57 PM   .ebus

## 2012-01-10 NOTE — H&P (View-Only) (Signed)
Name: Chelsea Hansen MRN: [id number] DOB: [birth year]  Referring Physician:  [facility] Reason for Consultation:  Abnormal chest CT   INTERVENTIONAL PULMONOLOGY CONSULTATION NOTE   History of Present Illness: 41 yo admitted after having syncopal episode, which was witnessed by her daughter.  She reports having lost her consciousness for about 10 minutes and feeling limp.  There were no focal deficits.  She reports intermittent substernal chest pain for several days prior to admission.  It was stabbing and non radiating.  There was no shortness of breath or palpitations.  She denies fever or chills.  Chest CT agio demonstrated no PE but presence of mediastinal / hilar adenopathy.  Denies night sweats or fevers.  Past Medical History  Diagnosis Date  . Hypertension   . Asthma    Past Surgical History  Procedure Date  . Abdominal hysterectomy   . Breast surgery    Prior to Admission medications   Medication Sig Start Date End Date Taking? Authorizing Provider  acetaminophen (TYLENOL) 500 MG tablet Take 500 mg by mouth every 6 (six) hours as needed. For pain   Yes Historical Provider, MD   Allergies No Known Allergies  Family History Lung disease:  None Malignancies:  Noe  Social History Smoking:  Life long non-smoker Occupational exposure:  None  Review Of Systems: Constitutional: Negative for fever, chills, weight loss, malaise/and diaphoresis. Positive for fatigue. HENT: Negative for hearing loss, ear pain, nosebleeds, congestion, sore throat, neck pain, tinnitus and ear discharge.   Eyes: Negative for blurred vision, double vision, photophobia, pain, discharge and redness.  Respiratory: Negative for cough, hemoptysis, sputum production, shortness of breath, wheezing and stridor.   Cardiovascular: Negative for orthopnea, claudication, leg swelling and PND. Positive for chest pain. Gastrointestinal: Negative for heartburn, nausea, vomiting, abdominal pain, diarrhea, constipation,  blood in stool and melena.  Genitourinary: Negative for dysuria, urgency, frequency, hematuria and flank pain.  Musculoskeletal: Negative for myalgias, back pain, joint pain and falls.  Skin: Negative for itching and rash.  Neurological: Negative for dizziness, tingling, tremors, sensory change, speech change, focal weakness, seizures, lweakness and headaches. Positive for syncope. Endo/Heme/Allergies: Negative for environmental allergies and polydipsia. Does not bruise/bleed easily.   Vital Signs: Filed Vitals:   12/31/11 0833 12/31/11 0836 12/31/11 1430 12/31/11 1630  BP: 139/82 131/84 132/85 134/84  Pulse: 70 81 57 49  Temp:   98.3 F (36.8 C) 98.3 F (36.8 C)  TempSrc:   Oral Oral  Resp:   20 20  Height:      Weight:      SpO2: 100% 100% 99% 99%   Physical Examination: General:  No acute distress Neuro:  Alert, oriented, nonfocal HEENT:  Pink conjunctivae, moist mucus membranes Neck:  No lymphadenopathy Cardiovascular:  RRR, no murmurs.  Somewhat bradycardic on telemetry with HR in low 50s. Lungs:  Bilateral air entry, no wheezes, rhonchi, rales Abdomen:  Soft, nontender, nondistended Musculoskeletal:  No clubbing, cyanosis or edema Skin: No rash   Labs:  Lab 12/31/11 0910 12/31/11 0254  HGB 11.1* 11.5*  HCT 34.3* 34.8*  WBC 6.1 7.4  PLT 225 226  NA 139 139  K 3.3* 3.3*  CL 102 104  CO2 25 25  GLUCOSE 91 108*  BUN 12 15  CREATININE 0.83 0.94  CALCIUM 9.4 9.2  MG 1.9 --  PHOS -- --  INR -- --  APTT -- --   ECG:  7/12 >>>  Sinus bradycardia  Chest CT(images reviewed):  7/12 >>> No evidence  of pulmonary embolus. Significantly enlarged mediastinal and right hilar nodes seen, measuring up to 2.4 cm in the mediastinum and up to 1.9 cm in the right hilum and peribronchial region. There is some degree of narrowing of pulmonary artery branches to the right middle lobe as a result. Mild bilateral dependent subsegmental atelectasis noted; lungs otherwise clear.  PFT:   NA  TTE:  NA  ASSESSMENT AND PLAN  Syncope Mild intermittent asthma, no evidence of exacerbation Hilar / mediastinal lymphadenopathy, likely reactive, possibly sarcoidosis, less likely malignancy  -->  Will schedule for bronchoscopy / BAL / EBUS-TBNA. -->  May discharge when stable.  The procedure can be performed as outpatient.  Orlean Bradford, M.D., F.C.C.P. Interventional Pulmonology [facility] Cell: [phone] Pager: [phone]  12/31/2011, 9:24 PM

## 2012-01-10 NOTE — Transfer of Care (Signed)
Immediate Anesthesia Transfer of Care Note  Patient: Chelsea Hansen  Procedure(s) Performed: Procedure(s) (LRB): VIDEO BRONCHOSCOPY WITH ENDOBRONCHIAL ULTRASOUND (N/A)  Patient Location: PACU  Anesthesia Type: General  Level of Consciousness: awake, alert  and oriented  Airway & Oxygen Therapy: Patient Spontanous Breathing and Patient connected to nasal cannula oxygen  Post-op Assessment: Report given to PACU RN and Post -op Vital signs reviewed and stable  Post vital signs: Reviewed and stable  Complications: No apparent anesthesia complications

## 2012-01-10 NOTE — Anesthesia Procedure Notes (Signed)
Procedure Name: Intubation Date/Time: 01/10/2012 11:08 AM Performed by: Margaree Mackintosh Pre-anesthesia Checklist: Emergency Drugs available, Timeout performed, Patient identified, Suction available and Patient being monitored Patient Re-evaluated:Patient Re-evaluated prior to inductionOxygen Delivery Method: Circle system utilized Preoxygenation: Pre-oxygenation with 100% oxygen Intubation Type: IV induction Ventilation: Mask ventilation without difficulty and Oral airway inserted - appropriate to patient size Laryngoscope Size: Mac and 3 Grade View: Grade II Tube type: Oral Tube size: 8.5 mm Number of attempts: 1 Airway Equipment and Method: Stylet and LTA kit utilized Placement Confirmation: ETT inserted through vocal cords under direct vision,  positive ETCO2 and breath sounds checked- equal and bilateral Secured at: 23 cm Tube secured with: Tape Dental Injury: Teeth and Oropharynx as per pre-operative assessment

## 2012-01-10 NOTE — Anesthesia Postprocedure Evaluation (Signed)
  Anesthesia Post-op Note  Patient: Chelsea Hansen  Procedure(s) Performed: Procedure(s) (LRB): VIDEO BRONCHOSCOPY WITH ENDOBRONCHIAL ULTRASOUND (N/A)  Patient Location: PACU  Anesthesia Type: General  Level of Consciousness: awake and alert   Airway and Oxygen Therapy: Patient Spontanous Breathing  Post-op Pain: mild  Post-op Assessment: Post-op Vital signs reviewed, Patient's Cardiovascular Status Stable, Respiratory Function Stable, Patent Airway and No signs of Nausea or vomiting  Post-op Vital Signs: stable  Complications: No apparent anesthesia complications

## 2012-01-14 ENCOUNTER — Inpatient Hospital Stay: Payer: Self-pay | Admitting: Pulmonary Disease

## 2012-04-29 ENCOUNTER — Encounter (HOSPITAL_COMMUNITY): Payer: Self-pay | Admitting: Emergency Medicine

## 2012-04-29 ENCOUNTER — Emergency Department (HOSPITAL_COMMUNITY)
Admission: EM | Admit: 2012-04-29 | Discharge: 2012-04-30 | Disposition: A | Discharge status: Home / Self Care | Payer: Self-pay | Attending: Emergency Medicine | Admitting: Emergency Medicine

## 2012-04-29 DIAGNOSIS — F1010 Alcohol abuse, uncomplicated: Secondary | ICD-10-CM | POA: Insufficient documentation

## 2012-04-29 DIAGNOSIS — Z853 Personal history of malignant neoplasm of breast: Secondary | ICD-10-CM | POA: Insufficient documentation

## 2012-04-29 DIAGNOSIS — J45901 Unspecified asthma with (acute) exacerbation: Secondary | ICD-10-CM | POA: Insufficient documentation

## 2012-04-29 DIAGNOSIS — F10929 Alcohol use, unspecified with intoxication, unspecified: Secondary | ICD-10-CM

## 2012-04-29 DIAGNOSIS — I10 Essential (primary) hypertension: Secondary | ICD-10-CM | POA: Insufficient documentation

## 2012-04-29 DIAGNOSIS — Z90710 Acquired absence of both cervix and uterus: Secondary | ICD-10-CM | POA: Insufficient documentation

## 2012-04-29 NOTE — ED Notes (Signed)
As per EMS, pt was SOB,ETOH, pt was wheezing in all fields. Pt was given 5mg  of albuterol. Pt is breathing fine now.VSS.PWD

## 2012-04-29 NOTE — ED Notes (Signed)
Bed:WA22<BR> Expected date:<BR> Expected time:<BR> Means of arrival:<BR> Comments:<BR> EMS

## 2012-04-30 ENCOUNTER — Emergency Department (HOSPITAL_COMMUNITY): Payer: Self-pay

## 2012-04-30 LAB — BASIC METABOLIC PANEL
Chloride: 100 mEq/L (ref 96–112)
GFR calc Af Amer: >90 mL/min (ref >90)
GFR calc non Af Amer: >90 mL/min (ref >90)
Glucose, Bld: 107 mg/dL — ABNORMAL HIGH (ref 70–99)
Potassium: 3.2 mEq/L — ABNORMAL LOW (ref 3.5–5.1)
Sodium: 138 mEq/L (ref 135–145)

## 2012-04-30 LAB — CBC WITH DIFFERENTIAL/PLATELET
Basophils Relative: 1 % (ref 0–1)
Eosinophils Absolute: 0.2 10*3/uL (ref 0.0–0.7)
Hemoglobin: 11.3 g/dL — ABNORMAL LOW (ref 12.0–15.0)
Lymphs Abs: 1.8 10*3/uL (ref 0.7–4.0)
MCH: 23.2 pg — ABNORMAL LOW (ref 26.0–34.0)
Neutro Abs: 4.3 10*3/uL (ref 1.7–7.7)
Neutrophils Relative %: 64 % (ref 43–77)
Platelets: 267 10*3/uL (ref 150–400)
RBC: 4.88 MIL/uL (ref 3.87–5.11)

## 2012-04-30 LAB — POCT I-STAT TROPONIN I: Troponin i, poc: 0.02 ng/mL (ref 0.00–0.08)

## 2012-04-30 MED ORDER — PREDNISONE 20 MG PO TABS: 60.0000 mg | ORAL_TABLET | Freq: Every day | ORAL | Status: DC

## 2012-04-30 MED ORDER — ALBUTEROL SULFATE HFA 108 (90 BASE) MCG/ACT IN AERS: 2.0000 | INHALATION_SPRAY | RESPIRATORY_TRACT | Status: DC | PRN

## 2012-04-30 MED ORDER — METHYLPREDNISOLONE SODIUM SUCC 125 MG IJ SOLR
125.0000 mg | Freq: Once | INTRAMUSCULAR | Status: AC
  Administered 2012-04-30: 125 mg via INTRAVENOUS
  Filled 2012-04-30: qty 2

## 2012-04-30 NOTE — ED Provider Notes (Signed)
History     CSN: [id number]  Arrival date & time 04/29/12  2334   First MD Initiated Contact with Patient 04/29/12 2350      Chief Complaint  Patient presents with  . Shortness of Breath    (Consider location/radiation/quality/duration/timing/severity/associated sxs/prior treatment) HPI 41 year old female presents to emergency room via EMS from home with complaint of wheezing and shortness of breath. EMS reports she was wheezing in all fields, given 5 mg of albuterol. Patient with resolution of symptoms now. Patient reports she has history of asthma, has had shortness of breath over last 2-3 weeks. Patient has history of enlarged lymph nodes in her mediastinum, prior biopsy done earlier this year. She reports "they're planning to take out my lung in December". I cannot find any information about the surgery in the EMR. Biopsy reports inconclusive. No recent visits to the bar pulmonology that I can find. Patient is unsure which Dr. is planning on doing surgery, when the surgery is, or when her next followup visit is. She denies any fever. She has had some cough. Patient reports drinking 3-4 beers tonight, and is a very poor historian. She refuses to answer most questions, pulling the covers up over her head and saying "I just want to sleep" Past Medical History  Diagnosis Date  . Hypertension   . Asthma   . Headache   . Cancer     breast, lmpectomy    Past Surgical History  Procedure Date  . Abdominal hysterectomy   . Breast surgery     lumpectomy    Family History  Problem Relation Age of Onset  . Hypertension Father     History  Substance Use Topics  . Smoking status: Never Smoker   . Smokeless tobacco: Never Used  . Alcohol Use: Yes     Comment: occassional    OB History    Grav Para Term Preterm Abortions TAB SAB Ect Mult Living                  Review of Systems  Unable to perform ROS: Other   intoxication  Allergies  Review of patient's allergies  indicates no known allergies.  Home Medications   Current Outpatient Rx  Name  Route  Sig  Dispense  Refill  . ALBUTEROL SULFATE HFA 108 (90 BASE) MCG/ACT IN AERS   Inhalation   Inhale 2 puffs into the lungs every 6 (six) hours as needed. For shortness of breath         . ALBUTEROL SULFATE HFA 108 (90 BASE) MCG/ACT IN AERS   Inhalation   Inhale 2 puffs into the lungs every 4 (four) hours as needed for wheezing.   1 Inhaler   0   . PREDNISONE 20 MG PO TABS   Oral   Take 3 tablets (60 mg total) by mouth daily.   15 tablet   0     BP 131/85  Pulse 80  Temp 97.8 F (36.6 C) (Oral)  Resp 20  Ht 5\' 11"  (1.803 m)  Wt 220 lb (99.791 kg)  BMI 30.68 kg/m2  SpO2 98%  Physical Exam  Nursing note and vitals reviewed. Constitutional:       Obesity, patient appears intoxicated, somnolent but will arouse. She is in no acute distress  HENT:  Head: Normocephalic and atraumatic.  Nose: Nose normal.  Mouth/Throat: Oropharynx is clear and moist.  Neck: Normal range of motion. Neck supple. No JVD present. No tracheal deviation present. No thyromegaly present.  Cardiovascular: Normal rate, regular rhythm, normal heart sounds and intact distal pulses.  Exam reveals no gallop and no friction rub.   No murmur heard. Pulmonary/Chest: Effort normal and breath sounds normal. No stridor. No respiratory distress. She has no wheezes. She has no rales. She exhibits no tenderness.  Abdominal: Soft. Bowel sounds are normal. She exhibits no distension and no mass. There is no tenderness. There is no rebound and no guarding.  Musculoskeletal: Normal range of motion. She exhibits no edema and no tenderness.  Lymphadenopathy:    She has no cervical adenopathy.  Neurological:       Somnolent but arousable oriented to self place, is not aware of the day, but knows it is November  Skin: Skin is warm and dry. No rash noted. No erythema. No pallor.  Psychiatric:       Intoxicated, uncooperative    ED  Course  Procedures (including critical care time)  Labs Reviewed  CBC WITH DIFFERENTIAL - Abnormal; Notable for the following:    Hemoglobin 11.3 (*)     HCT 34.3 (*)     MCV 70.3 (*)     MCH 23.2 (*)     All other components within normal limits  BASIC METABOLIC PANEL - Abnormal; Notable for the following:    Potassium 3.2 (*)     Glucose, Bld 107 (*)     All other components within normal limits  ETHANOL - Abnormal; Notable for the following:    Alcohol, Ethyl (B) 153 (*)     All other components within normal limits  POCT I-STAT TROPONIN I   Dg Chest 2 View (if Patient Has Fever And/or Copd)  04/30/2012  *RADIOLOGY REPORT*  Clinical Data: Shortness of breath, history of breast cancer  CHEST - 2 VIEW  Comparison: 01/10/2012; 12/31/2011; 11/18/2010; chest CT - 12/31/2011  Findings: Grossly unchanged borderline enlarged cardiac silhouette and mediastinal contours with slight differences treatable to decreased lung volumes and AP projection.  Unchanged prominent nodularity of the right hilum and thickening of the right paratracheal stripe compatible with adenopathy demonstrated on recent chest CT.  No focal airspace opacities.  No pleural effusion or pneumothorax.  Unchanged bones.  IMPRESSION: 1.  No acute cardiopulmonary disease. 2.  Grossly unchanged widening of the right paratracheal stripe and nodularity of the right hilum compatible with adenopathy demonstrated on recent chest CT.   Original Report Authenticated By: Tacey Ruiz, MD     Date: 04/30/2012  Rate: 87  Rhythm: normal sinus rhythm  QRS Axis: normal  Intervals: normal  ST/T Wave abnormalities: normal  Conduction Disutrbances:incomplete RBBB  Narrative Interpretation:   Old EKG Reviewed: unchanged    1. Asthma exacerbation   2. Alcohol intoxication       MDM  41 year old female with asthma exacerbation. Will start on short course of prednisone. Chest x-ray without infiltrate. It is noted that she has  lymphadenopathy. I am unclear as to what procedure patient is going to be having done. She is recommended to followup with pulmonology at [facility] health care for her asthma. It appears the past she had been on Qvar and Flovent. She also may need further workup for her mediastinal lymphadenopathy, but would defer further workup of that at this time.        Olivia Mackie, MD 05/01/12 [id number]

## 2012-11-07 ENCOUNTER — Emergency Department (HOSPITAL_COMMUNITY): Payer: Self-pay

## 2012-11-07 ENCOUNTER — Encounter (HOSPITAL_COMMUNITY): Payer: Self-pay | Admitting: Emergency Medicine

## 2012-11-07 ENCOUNTER — Emergency Department (HOSPITAL_COMMUNITY)
Admission: EM | Admit: 2012-11-07 | Discharge: 2012-11-07 | Disposition: A | Discharge status: Home / Self Care | Payer: Self-pay | Attending: Emergency Medicine | Admitting: Emergency Medicine

## 2012-11-07 DIAGNOSIS — Z79899 Other long term (current) drug therapy: Secondary | ICD-10-CM | POA: Insufficient documentation

## 2012-11-07 DIAGNOSIS — I10 Essential (primary) hypertension: Secondary | ICD-10-CM | POA: Insufficient documentation

## 2012-11-07 DIAGNOSIS — I498 Other specified cardiac arrhythmias: Secondary | ICD-10-CM | POA: Insufficient documentation

## 2012-11-07 DIAGNOSIS — J45909 Unspecified asthma, uncomplicated: Secondary | ICD-10-CM | POA: Insufficient documentation

## 2012-11-07 DIAGNOSIS — Z85828 Personal history of other malignant neoplasm of skin: Secondary | ICD-10-CM | POA: Insufficient documentation

## 2012-11-07 DIAGNOSIS — R12 Heartburn: Secondary | ICD-10-CM | POA: Insufficient documentation

## 2012-11-07 DIAGNOSIS — K920 Hematemesis: Secondary | ICD-10-CM | POA: Insufficient documentation

## 2012-11-07 DIAGNOSIS — R079 Chest pain, unspecified: Secondary | ICD-10-CM | POA: Insufficient documentation

## 2012-11-07 LAB — BASIC METABOLIC PANEL
BUN: 12 mg/dL (ref 6–23)
CO2: 26 mEq/L (ref 19–32)
Calcium: 9.4 mg/dL (ref 8.4–10.5)
Chloride: 102 mEq/L (ref 96–112)
Creatinine, Ser: 0.79 mg/dL (ref 0.50–1.10)
GFR calc Af Amer: >90 mL/min (ref >90)
GFR calc non Af Amer: >90 mL/min (ref >90)
Glucose, Bld: 102 mg/dL — ABNORMAL HIGH (ref 70–99)
Potassium: 3.7 mEq/L (ref 3.5–5.1)
Sodium: 138 mEq/L (ref 135–145)

## 2012-11-07 LAB — CBC
HCT: 38.6 % (ref 36.0–46.0)
Hemoglobin: 12.1 g/dL (ref 12.0–15.0)
MCH: 22.6 pg — ABNORMAL LOW (ref 26.0–34.0)
MCHC: 31.3 g/dL (ref 30.0–36.0)
MCV: 72 fL — ABNORMAL LOW (ref 78.0–100.0)
Platelets: 265 10*3/uL (ref 150–400)
RBC: 5.36 MIL/uL — ABNORMAL HIGH (ref 3.87–5.11)
RDW: 16.2 % — ABNORMAL HIGH (ref 11.5–15.5)
WBC: 7.4 10*3/uL (ref 4.0–10.5)

## 2012-11-07 LAB — POCT I-STAT TROPONIN I: Troponin i, poc: 0 ng/mL (ref 0.00–0.08)

## 2012-11-07 LAB — LIPASE, BLOOD: Lipase: 25 U/L (ref 11–59)

## 2012-11-07 MED ORDER — SODIUM CHLORIDE 0.9 % IV BOLUS (SEPSIS)
1000.0000 mL | Freq: Once | INTRAVENOUS | Status: AC
  Administered 2012-11-07: 1000 mL via INTRAVENOUS

## 2012-11-07 MED ORDER — PANTOPRAZOLE SODIUM 20 MG PO TBEC: 20.0000 mg | DELAYED_RELEASE_TABLET | Freq: Every day | ORAL | Status: DC

## 2012-11-07 MED ORDER — GI COCKTAIL ~~LOC~~
30.0000 mL | Freq: Once | ORAL | Status: DC
  Filled 2012-11-07: qty 30

## 2012-11-07 MED ORDER — PANTOPRAZOLE SODIUM 40 MG IV SOLR
40.0000 mg | Freq: Once | INTRAVENOUS | Status: AC
Start: 2012-11-07 — End: 2012-11-07
  Administered 2012-11-07: 40 mg via INTRAVENOUS
  Filled 2012-11-07: qty 40

## 2012-11-07 NOTE — ED Notes (Signed)
Pt states that she has been having chest pain for the last 2 wks. Hx of MI.  States that she has been throwing up blood for the last 3 days.  States that it is a lot of blood ("I filled up the toilet with blood").  Has thrown up "several times".

## 2012-11-07 NOTE — Progress Notes (Signed)
[facility] has seen patient and provided her with Primary Care Resources. ° °

## 2012-11-07 NOTE — ED Provider Notes (Signed)
History    41yf with CP. Onset about 2 weeks ago. Initially intermittent but has been more or less constant for past 4 days. Epigastric/midsternal. Achy/burning. No appreciable exacerbating or relieving factors. No SOB. No fever or chills. Nausea and had vomited. Has noticed blood in vomitus. No blood thinners. Denies hx of liver problems. Only occasional etoh. No BRBPR or melena.   CSN: [id number]  Arrival date & time 11/07/12  1133   First MD Initiated Contact with Patient 11/07/12 1158      Chief Complaint  Patient presents with  . Hematemesis  . Chest Pain    (Consider location/radiation/quality/duration/timing/severity/associated sxs/prior treatment) HPI  Past Medical History  Diagnosis Date  . Hypertension   . Asthma   . Headache   . Cancer     breast, lmpectomy    Past Surgical History  Procedure Laterality Date  . Abdominal hysterectomy    . Breast surgery      lumpectomy    Family History  Problem Relation Age of Onset  . Hypertension Father     History  Substance Use Topics  . Smoking status: Never Smoker   . Smokeless tobacco: Never Used  . Alcohol Use: Yes     Comment: occassional    OB History   Grav Para Term Preterm Abortions TAB SAB Ect Mult Living                  Review of Systems  All systems reviewed and negative, other than as noted in HPI.   Allergies  Review of patient's allergies indicates no known allergies.  Home Medications   Current Outpatient Rx  Name  Route  Sig  Dispense  Refill  . albuterol (PROVENTIL HFA;VENTOLIN HFA) 108 (90 BASE) MCG/ACT inhaler   Inhalation   Inhale 2 puffs into the lungs every 6 (six) hours as needed. For shortness of breath         . diphenhydrAMINE (BENADRYL) 25 MG tablet   Oral   Take 75 mg by mouth every 6 (six) hours as needed for itching or allergies.           BP 161/91  Pulse 65  Temp(Src) 98.5 F (36.9 C) (Oral)  Resp 16  SpO2 100%  Physical Exam  Nursing note and  vitals reviewed. Constitutional: She appears well-developed and well-nourished. No distress.  HENT:  Head: Normocephalic and atraumatic.  Posterior pharynx clear  Eyes: Conjunctivae are normal. Right eye exhibits no discharge. Left eye exhibits no discharge.  Neck: Neck supple.  Cardiovascular: Normal rate, regular rhythm and normal heart sounds.  Exam reveals no gallop and no friction rub.   No murmur heard. Pulmonary/Chest: Effort normal and breath sounds normal. No respiratory distress.  Abdominal: Soft. She exhibits no distension. There is no tenderness.  Musculoskeletal: She exhibits no edema and no tenderness.  Neurological: She is alert.  Skin: Skin is warm and dry.  Psychiatric: She has a normal mood and affect. Her behavior is normal. Thought content normal.    ED Course  Procedures (including critical care time)  Labs Reviewed  CBC - Abnormal; Notable for the following:    RBC 5.36 (*)    MCV 72.0 (*)    MCH 22.6 (*)    RDW 16.2 (*)    All other components within normal limits  BASIC METABOLIC PANEL - Abnormal; Notable for the following:    Glucose, Bld 102 (*)    All other components within normal limits  LIPASE, BLOOD  POCT I-STAT TROPONIN I   No results found. Dg Chest Port 1 View  11/07/2012   *RADIOLOGY REPORT*  Clinical Data: Chest pain.  Hematemesis.  History of breast cancer.  PORTABLE CHEST - 1 VIEW  Comparison: Chest x-ray 04/30/2012.  Findings: Lung volumes are normal.  No consolidative airspace disease.  No pleural effusions.  No pneumothorax.  No pulmonary nodule or mass noted.  Pulmonary vasculature and the cardiomediastinal silhouette are within normal limits.  IMPRESSION: 1. No radiographic evidence of acute cardiopulmonary disease.   Original Report Authenticated By: Trudie Reed, M.D.   EKG:  Rhythm: normal sinus Vent. rate 60 BPM PR interval 128 ms QRS duration 108 ms QT/QTc 432/432 ms Poor r wave progession ST segments: normal   1. Chest  pain   2. Hematemesis       MDM  41yF with CP. Suspect GI. Consider ACS but doubt. Prolonged nature atypical. Doubt PE. Doubt infectious. CXR clear. Afebrile. No increased WOB. O2 saturations normal on RA. Difficult to quantify how much bleeding actually having, but suspect minimal. Actually mild bradycardia and not on rate controlling meds. No dizziness, lightheadedness or SOB. Not on blood thinners. BP ok. No hx of esophageal varices/hepatic dysfunction. Hemoglobin normal. No episodes in ED. I feel safe for DC. Will give trial of famotidine. Outpt FU.         Raeford Razor, MD 11/15/12 [phone]

## 2013-11-01 ENCOUNTER — Emergency Department (HOSPITAL_COMMUNITY): Payer: Medicaid Other

## 2013-11-01 ENCOUNTER — Emergency Department (HOSPITAL_COMMUNITY)
Admission: EM | Admit: 2013-11-01 | Discharge: 2013-11-01 | Disposition: A | Discharge status: Home / Self Care | Payer: Medicaid Other | Attending: Emergency Medicine | Admitting: Emergency Medicine

## 2013-11-01 ENCOUNTER — Encounter (HOSPITAL_COMMUNITY): Payer: Self-pay | Admitting: Emergency Medicine

## 2013-11-01 DIAGNOSIS — R112 Nausea with vomiting, unspecified: Secondary | ICD-10-CM

## 2013-11-01 DIAGNOSIS — I10 Essential (primary) hypertension: Secondary | ICD-10-CM | POA: Insufficient documentation

## 2013-11-01 DIAGNOSIS — J45909 Unspecified asthma, uncomplicated: Secondary | ICD-10-CM | POA: Insufficient documentation

## 2013-11-01 DIAGNOSIS — Z90710 Acquired absence of both cervix and uterus: Secondary | ICD-10-CM | POA: Insufficient documentation

## 2013-11-01 DIAGNOSIS — N739 Female pelvic inflammatory disease, unspecified: Secondary | ICD-10-CM | POA: Insufficient documentation

## 2013-11-01 DIAGNOSIS — A599 Trichomoniasis, unspecified: Secondary | ICD-10-CM | POA: Insufficient documentation

## 2013-11-01 DIAGNOSIS — K625 Hemorrhage of anus and rectum: Secondary | ICD-10-CM | POA: Insufficient documentation

## 2013-11-01 DIAGNOSIS — R197 Diarrhea, unspecified: Secondary | ICD-10-CM | POA: Insufficient documentation

## 2013-11-01 DIAGNOSIS — Z853 Personal history of malignant neoplasm of breast: Secondary | ICD-10-CM | POA: Insufficient documentation

## 2013-11-01 DIAGNOSIS — Z79899 Other long term (current) drug therapy: Secondary | ICD-10-CM | POA: Insufficient documentation

## 2013-11-01 DIAGNOSIS — N730 Acute parametritis and pelvic cellulitis: Secondary | ICD-10-CM

## 2013-11-01 LAB — CBC WITH DIFFERENTIAL/PLATELET
Basophils Absolute: 0 10*3/uL (ref 0.0–0.1)
Basophils Relative: 0 % (ref 0–1)
Eosinophils Absolute: 0 10*3/uL (ref 0.0–0.7)
Eosinophils Relative: 1 % (ref 0–5)
HCT: 36.2 % (ref 36.0–46.0)
Hemoglobin: 11.7 g/dL — ABNORMAL LOW (ref 12.0–15.0)
LYMPHS ABS: 1.6 10*3/uL (ref 0.7–4.0)
LYMPHS PCT: 25 % (ref 12–46)
MCH: 23.5 pg — ABNORMAL LOW (ref 26.0–34.0)
MCHC: 32.3 g/dL (ref 30.0–36.0)
MCV: 72.8 fL — ABNORMAL LOW (ref 78.0–100.0)
MONOS PCT: 7 % (ref 3–12)
Monocytes Absolute: 0.4 10*3/uL (ref 0.1–1.0)
NEUTROS PCT: 67 % (ref 43–77)
Neutro Abs: 4.4 10*3/uL (ref 1.7–7.7)
Platelets: 270 10*3/uL (ref 150–400)
RBC: 4.97 MIL/uL (ref 3.87–5.11)
RDW: 15.6 % — ABNORMAL HIGH (ref 11.5–15.5)
WBC: 6.4 10*3/uL (ref 4.0–10.5)

## 2013-11-01 LAB — POC OCCULT BLOOD, ED: Fecal Occult Bld: NEGATIVE

## 2013-11-01 LAB — COMPREHENSIVE METABOLIC PANEL
ALK PHOS: 67 U/L (ref 39–117)
ALT: 15 U/L (ref 0–35)
AST: 17 U/L (ref 0–37)
Albumin: 3.9 g/dL (ref 3.5–5.2)
BILIRUBIN TOTAL: 0.3 mg/dL (ref 0.3–1.2)
BUN: 11 mg/dL (ref 6–23)
CO2: 26 mEq/L (ref 19–32)
Calcium: 9 mg/dL (ref 8.4–10.5)
Chloride: 102 mEq/L (ref 96–112)
Creatinine, Ser: 0.75 mg/dL (ref 0.50–1.10)
GFR calc non Af Amer: >90 mL/min (ref >90)
GLUCOSE: 92 mg/dL (ref 70–99)
Potassium: 3.9 mEq/L (ref 3.7–5.3)
Sodium: 139 mEq/L (ref 137–147)
Total Protein: 7.4 g/dL (ref 6.0–8.3)

## 2013-11-01 LAB — I-STAT TROPONIN, ED: Troponin i, poc: 0.01 ng/mL (ref 0.00–0.08)

## 2013-11-01 LAB — WET PREP, GENITAL: Yeast Wet Prep HPF POC: NONE SEEN

## 2013-11-01 LAB — URINALYSIS, ROUTINE W REFLEX MICROSCOPIC
BILIRUBIN URINE: NEGATIVE
GLUCOSE, UA: NEGATIVE mg/dL
Hgb urine dipstick: NEGATIVE
KETONES UR: NEGATIVE mg/dL
Leukocytes, UA: NEGATIVE
Nitrite: NEGATIVE
Protein, ur: NEGATIVE mg/dL
Specific Gravity, Urine: 1.017 (ref 1.005–1.030)
Urobilinogen, UA: 0.2 mg/dL (ref 0.0–1.0)
pH: 7.5 (ref 5.0–8.0)

## 2013-11-01 LAB — LIPASE, BLOOD: Lipase: 23 U/L (ref 11–59)

## 2013-11-01 LAB — I-STAT CG4 LACTIC ACID, ED: Lactic Acid, Venous: 0.87 mmol/L (ref 0.5–2.2)

## 2013-11-01 MED ORDER — OXYCODONE-ACETAMINOPHEN 5-325 MG PO TABS: 1.0000 | ORAL_TABLET | Freq: Four times a day (QID) | ORAL | Status: DC | PRN

## 2013-11-01 MED ORDER — METRONIDAZOLE 500 MG PO TABS: 500.0000 mg | ORAL_TABLET | Freq: Two times a day (BID) | ORAL | Status: DC

## 2013-11-01 MED ORDER — ONDANSETRON HCL 4 MG/2ML IJ SOLN
4.0000 mg | Freq: Once | INTRAMUSCULAR | Status: AC
  Administered 2013-11-01: 4 mg via INTRAVENOUS
  Filled 2013-11-01: qty 2

## 2013-11-01 MED ORDER — AZITHROMYCIN 250 MG PO TABS
1000.0000 mg | ORAL_TABLET | Freq: Once | ORAL | Status: AC
  Administered 2013-11-01: 1000 mg via ORAL
  Filled 2013-11-01: qty 4

## 2013-11-01 MED ORDER — DOXYCYCLINE HYCLATE 100 MG PO CAPS: 100.0000 mg | ORAL_CAPSULE | Freq: Two times a day (BID) | ORAL | Status: DC

## 2013-11-01 MED ORDER — LIDOCAINE HCL 1 % IJ SOLN
INTRAMUSCULAR | Status: AC
  Administered 2013-11-01: 5 mL
  Filled 2013-11-01: qty 20

## 2013-11-01 MED ORDER — PROMETHAZINE HCL 25 MG PO TABS: 25.0000 mg | ORAL_TABLET | Freq: Four times a day (QID) | ORAL | Status: DC | PRN

## 2013-11-01 MED ORDER — MORPHINE SULFATE 4 MG/ML IJ SOLN
4.0000 mg | Freq: Once | INTRAMUSCULAR | Status: AC
  Administered 2013-11-01: 4 mg via INTRAVENOUS
  Filled 2013-11-01: qty 1

## 2013-11-01 MED ORDER — CEFTRIAXONE SODIUM 250 MG IJ SOLR
250.0000 mg | Freq: Once | INTRAMUSCULAR | Status: AC
  Administered 2013-11-01: 250 mg via INTRAMUSCULAR
  Filled 2013-11-01: qty 250

## 2013-11-01 MED ORDER — METRONIDAZOLE 500 MG PO TABS
2000.0000 mg | ORAL_TABLET | Freq: Once | ORAL | Status: AC
  Administered 2013-11-01: 2000 mg via ORAL
  Filled 2013-11-01: qty 4

## 2013-11-01 MED ORDER — SODIUM CHLORIDE 0.9 % IV BOLUS (SEPSIS)
1000.0000 mL | Freq: Once | INTRAVENOUS | Status: AC
  Administered 2013-11-01: 1000 mL via INTRAVENOUS

## 2013-11-01 NOTE — ED Notes (Signed)
Pt received zofran 4mg . Emesis x1 when attempting to swallow pills. Not actively vomiting at this time. Says "I just don't like pills."

## 2013-11-01 NOTE — ED Provider Notes (Signed)
CSN: [id number]     Arrival date & time 11/01/13  0957 History   First MD Initiated Contact with Patient 11/01/13 1015     Chief Complaint  Patient presents with  . Chest Pain  . Emesis  . Diarrhea     (Consider location/radiation/quality/duration/timing/severity/associated sxs/prior Treatment) HPI Comments: Patient is a 43 year old female with history of asthma, hypertension, cancer who presents today with nausea, vomiting, abdominal pain, diarrhea since 3 AM. She reports that it is a crampy sensation in her lower abdomen. This morning she had bright red blood per rectum. Emesis is nonbloody. She has never had pain like this in the past. The pain radiates into her chest. She reports she has had chest pain for the past 3 weeks, but it essentially resolved prior to this episode. She at a salad that she made at [employer] yesterday. No recent travel, suspicious food intake. No fever, chills, diaphoresis, shortness of breath, vaginal discharge, dysuria, urinary urgency, urinary frequency.  The history is provided by the patient. No language interpreter was used.    Past Medical History  Diagnosis Date  . Hypertension   . Asthma   . Headache(784.0)   . Cancer     breast, lmpectomy   Past Surgical History  Procedure Laterality Date  . Abdominal hysterectomy    . Breast surgery      lumpectomy   Family History  Problem Relation Age of Onset  . Hypertension Father    History  Substance Use Topics  . Smoking status: Never Smoker   . Smokeless tobacco: Never Used  . Alcohol Use: Yes     Comment: occassional   OB History   Grav Para Term Preterm Abortions TAB SAB Ect Mult Living                 Review of Systems  Constitutional: Negative for fever, chills and diaphoresis.  Respiratory: Negative for shortness of breath.   Cardiovascular: Positive for chest pain.  Gastrointestinal: Positive for nausea, vomiting, abdominal pain, diarrhea and anal bleeding.  All other systems  reviewed and are negative.     Allergies  Review of patient's allergies indicates no known allergies.  Home Medications   Prior to Admission medications   Medication Sig Start Date End Date Taking? Authorizing Provider  albuterol (PROVENTIL HFA;VENTOLIN HFA) 108 (90 BASE) MCG/ACT inhaler Inhale 2 puffs into the lungs every 6 (six) hours as needed. For shortness of breath    Historical Provider, MD  diphenhydrAMINE (BENADRYL) 25 MG tablet Take 75 mg by mouth every 6 (six) hours as needed for itching or allergies.    Historical Provider, MD  pantoprazole (PROTONIX) 20 MG tablet Take 1 tablet (20 mg total) by mouth daily. 11/07/12   Virgel Manifold, MD   BP 165/97  Pulse 72  Temp(Src) 98.7 F (37.1 C) (Oral)  Resp 20  SpO2 99% Physical Exam  Nursing note and vitals reviewed. Constitutional: She is oriented to person, place, and time. She appears well-developed and well-nourished. No distress.  HENT:  Head: Normocephalic and atraumatic.  Right Ear: External ear normal.  Left Ear: External ear normal.  Nose: Nose normal.  Mouth/Throat: Oropharynx is clear and moist.  Eyes: Conjunctivae are normal.  Neck: Normal range of motion.  Cardiovascular: Normal rate, regular rhythm and normal heart sounds.   Pulmonary/Chest: Effort normal and breath sounds normal. No stridor. No respiratory distress. She has no wheezes. She has no rales.    Abdominal: Soft. She exhibits no distension.  There is tenderness. There is no rigidity, no rebound and no guarding.  Genitourinary: Rectal exam shows no external hemorrhoid, no fissure and no tenderness. There is no rash, tenderness, lesion or injury on the right labia. There is no rash, tenderness, lesion or injury on the left labia. Uterus is tender. Cervix exhibits discharge. Cervix exhibits no motion tenderness and no friability. Right adnexum displays tenderness. Right adnexum displays no mass and no fullness. Left adnexum displays tenderness. Left  adnexum displays no mass and no fullness. No erythema, tenderness or bleeding around the vagina. No foreign body around the vagina. No signs of injury around the vagina. Vaginal discharge found.  Brown stool seen on finger during rectal exam. No external hemorrhoids visualized Copious amount of white vaginal discharge seen on pelvic exam. Diffusely tender throughout exam. No chandelier sign.   Musculoskeletal: Normal range of motion.  Neurological: She is alert and oriented to person, place, and time. She has normal strength.  Skin: Skin is warm and dry. She is not diaphoretic. No erythema.  Psychiatric: She has a normal mood and affect. Her behavior is normal.    ED Course  Procedures (including critical care time) Labs Review Labs Reviewed  WET PREP, GENITAL - Abnormal; Notable for the following:    Trich, Wet Prep MANY (*)    Clue Cells Wet Prep HPF POC FEW (*)    WBC, Wet Prep HPF POC FEW (*)    All other components within normal limits  CBC WITH DIFFERENTIAL - Abnormal; Notable for the following:    Hemoglobin 11.7 (*)    MCV 72.8 (*)    MCH 23.5 (*)    RDW 15.6 (*)    All other components within normal limits  URINE CULTURE  GC/CHLAMYDIA PROBE AMP  COMPREHENSIVE METABOLIC PANEL  LIPASE, BLOOD  URINALYSIS, ROUTINE W REFLEX MICROSCOPIC  I-STAT TROPOININ, ED  I-STAT CG4 LACTIC ACID, ED  POC OCCULT BLOOD, ED    Imaging Review Dg Abd Acute W/chest  11/01/2013   CLINICAL DATA:  Chest pain for 3 weeks with radiating numbness into right arm  EXAM: ACUTE ABDOMEN SERIES (ABDOMEN 2 VIEW & CHEST 1 VIEW)  COMPARISON:  DG CHEST 1V PORT dated 11/07/2012  FINDINGS: There is no evidence of dilated bowel loops or free intraperitoneal air. No radiopaque calculi or other significant radiographic abnormality is seen. Heart size and mediastinal contours are within normal limits. Both lungs are clear.  IMPRESSION: Negative abdominal radiographs.  No acute cardiopulmonary disease.   Electronically  Signed   By: Skipper Cliche M.D.   On: 11/01/2013 11:33     EKG Interpretation None      MDM   Final diagnoses:  Trichomoniasis  PID (acute pelvic inflammatory disease)  Nausea vomiting and diarrhea   Patient is nontoxic, nonseptic appearing, in no apparent distress.  Patient's pain and other symptoms adequately managed in emergency department.  Fluid bolus given.  Labs, imaging and vitals reviewed.  Patient does not meet the SIRS or Sepsis criteria.  On repeat exam patient does not have a surgical abdomen and there are no peritoneal signs.  No indication of appendicitis, bowel obstruction, bowel perforation, cholecystitis, diverticulitis, or ectopic pregnancy.Patient was found to have trichomoniasis on wet prep. Will treat for PID. Patient tolerated PO fluids prior to discharge. Patient discharged home with symptomatic treatment and abx and given strict instructions for follow-up with their primary care physician.  I have also discussed reasons to return immediately to the ER.  Patient expresses  understanding and agrees with plan. Discussed case with Dr. Wilson Singer who agrees with plan. Patient / Family / Caregiver informed of clinical course, understand medical decision-making process, and agree with plan.     Medications  sodium chloride 0.9 % bolus 1,000 mL (0 mLs Intravenous Stopped 11/01/13 1329)  morphine 4 MG/ML injection 4 mg (4 mg Intravenous Given 11/01/13 1109)  ondansetron (ZOFRAN) injection 4 mg (4 mg Intravenous Given 11/01/13 1109)  metroNIDAZOLE (FLAGYL) tablet 2,000 mg (2,000 mg Oral Given 11/01/13 1432)  azithromycin (ZITHROMAX) tablet 1,000 mg (1,000 mg Oral Given 11/01/13 1432)  cefTRIAXone (ROCEPHIN) injection 250 mg (250 mg Intramuscular Given 11/01/13 1431)  lidocaine (XYLOCAINE) 1 % (with pres) injection (5 mLs  Given 11/01/13 1432)  ondansetron (ZOFRAN) injection 4 mg (4 mg Intravenous Given 11/01/13 1434)       Elwyn Lade, PA-C 11/03/13 0840

## 2013-11-01 NOTE — ED Notes (Signed)
Initial contact-A&O x4. C/o 10/10 CP radiating to right shoulder. C/o nausea. Morphine and Zofran given. No other complaints at this time. In NAD. Returned from x-ray at 1115.

## 2013-11-01 NOTE — ED Notes (Signed)
Patient transported to X-ray 

## 2013-11-01 NOTE — ED Notes (Signed)
Pt speaking on the phone with current sexual partner at this time. Verbalized to patient and to partner that testing for him is recommended also. Pt reports he is her only partner at this time. AVS explained in detail including abx regimen. All antibiotics given and pt is no longer feeling nauseous. No other complaints at this time.

## 2013-11-01 NOTE — Discharge Instructions (Signed)
Nausea and Vomiting Nausea means you feel sick to your stomach. Throwing up (vomiting) is a reflex where stomach contents come out of your mouth. HOME CARE   Take medicine as told by your doctor.  Do not force yourself to eat. However, you do need to drink fluids.  If you feel like eating, eat a normal diet as told by your doctor.  Eat rice, wheat, potatoes, bread, lean meats, yogurt, fruits, and vegetables.  Avoid high-fat foods.  Drink enough fluids to keep your pee (urine) clear or pale yellow.  Ask your doctor how to replace body fluid losses (rehydrate). Signs of body fluid loss (dehydration) include:  Feeling very thirsty.  Dry lips and mouth.  Feeling dizzy.  Dark pee.  Peeing less than normal.  Feeling confused.  Fast breathing or heart rate. GET HELP RIGHT AWAY IF:   You have blood in your throw up.  You have black or bloody poop (stool).  You have a bad headache or stiff neck.  You feel confused.  You have bad belly (abdominal) pain.  You have chest pain or trouble breathing.  You do not pee at least once every 8 hours.  You have cold, clammy skin.  You keep throwing up after 24 to 48 hours.  You have a fever. MAKE SURE YOU:   Understand these instructions.  Will watch your condition.  Will get help right away if you are not doing well or get worse. Document Released: 11/24/2007 Document Revised: 08/30/2011 Document Reviewed: 11/06/2010 [facility] Patient Information 2014 [place], [place].  Pelvic Inflammatory Disease Pelvic inflammatory disease (PID) refers to an infection in some or all of the female organs. The infection can be in the uterus, ovaries, fallopian tubes, or the surrounding tissues in the pelvis. PID can cause abdominal or pelvic pain that comes on suddenly (acute pelvic pain). PID is a serious infection because it can lead to lasting (chronic) pelvic pain or the inability to have children (infertile).  CAUSES  The infection is  often caused by the normal bacteria found in the vaginal tissues. PID may also be caused by an infection that is spread during sexual contact. PID can also occur following:   The birth of a baby.   A miscarriage.   An abortion.   Major pelvic surgery.   The use of an intrauterine device (IUD).   A sexual assault.  RISK FACTORS Certain factors can put a person at higher risk for PID, such as:  Being younger than 25 years.  Being sexually active at [place] age.  Usingnonbarrier contraception.  Havingmultiple sexual partners.  Having sex with someone who has symptoms of a genital infection.  Using oral contraception. Other times, certain behaviors can increase the possibility of getting PID, such as:  Having sex during your period.  Using a vaginal douche.  Having an intrauterine device (IUD) in place. SYMPTOMS   Abdominal or pelvic pain.   Fever.   Chills.   Abnormal vaginal discharge.  Abnormal uterine bleeding.   Unusual pain shortly after finishing your period. DIAGNOSIS  Your caregiver will choose some of the following methods to make a diagnosis, such as:   Performinga physical exam and history. A pelvic exam typically reveals a very tender uterus and surrounding pelvis.   Ordering laboratory tests including a pregnancy test, blood tests, and urine test.  Orderingcultures of the vagina and cervix to check for a sexually transmitted infection (STI).  Performing an ultrasound.   Performing a laparoscopic procedure to  look inside the pelvis.  TREATMENT   Antibiotic medicines may be prescribed and taken by mouth.   Sexual partners may be treated when the infection is caused by a sexually transmitted disease (STD).   Hospitalization may be needed to give antibiotics intravenously.  Surgery may be needed, but this is rare. It may take weeks until you are completely well. If you are diagnosed with PID, you should also be checked for  human immunodeficiency virus (HIV). HOME CARE INSTRUCTIONS   If given, take your antibiotics as directed. Finish the medicine even if you start to feel better.   Only take over-the-counter or prescription medicines for pain, discomfort, or fever as directed by your caregiver.   Do not have sexual intercourse until treatment is completed or as directed by your caregiver. If PID is confirmed, your recent sexual partner(s) will need treatment.   Keep your follow-up appointments. SEEK MEDICAL CARE IF:   You have increased or abnormal vaginal discharge.   You need prescription medicine for your pain.   You vomit.   You cannot take your medicines.   Your partner has an STD.  SEEK IMMEDIATE MEDICAL CARE IF:   You have a fever.   You have increased abdominal or pelvic pain.   You have chills.   You have pain when you urinate.   You are not better after 72 hours following treatment.  MAKE SURE YOU:   Understand these instructions.  Will watch your condition.  Will get help right away if you are not doing well or get worse. Document Released: 06/07/2005 Document Revised: 10/02/2012 Document Reviewed: 06/03/2011 [facility] Patient Information 2014 [place], [place].  Trichomoniasis Trichomoniasis is an infection, caused by the Trichomonas organism, that affects both women and men. In women, the outer female genitalia and the vagina are affected. In men, the penis is mainly affected, but the prostate and other reproductive organs can also be involved. Trichomoniasis is a sexually transmitted disease (STD) and is most often passed to another person through sexual contact. The majority of people who get trichomoniasis do so from a sexual encounter and are also at risk for other STDs. CAUSES   Sexual intercourse with an infected partner.  It can be present in swimming pools or hot tubs. SYMPTOMS   Abnormal gray-green frothy vaginal discharge in women.  Vaginal itching  and irritation in women.  Itching and irritation of the area outside the vagina in women.  Penile discharge with or without pain in males.  Inflammation of the urethra (urethritis), causing painful urination.  Bleeding after sexual intercourse. RELATED COMPLICATIONS  Pelvic inflammatory disease.  Infection of the uterus (endometritis).  Infertility.  Tubal (ectopic) pregnancy.  It can be associated with other STDs, including gonorrhea and chlamydia, hepatitis B, and HIV. COMPLICATIONS DURING PREGNANCY  Early (premature) delivery.  Premature rupture of the membranes (PROM).  Low birth weight. DIAGNOSIS   Visualization of Trichomonas under the microscope from the vagina discharge.  Ph of the vagina greater than 4.5, tested with a test tape.  Trich Rapid Test.  Culture of the organism, but this is not usually needed.  It may be found on a Pap test.  Having a "strawberry cervix,"which means the cervix looks very red like a strawberry. TREATMENT   You may be given medication to fight the infection. Inform your caregiver if you could be or are pregnant. Some medications used to treat the infection should not be taken during pregnancy.  Over-the-counter medications or creams to decrease itching  or irritation may be recommended.  Your sexual partner will need to be treated if infected. HOME CARE INSTRUCTIONS   Take all medication prescribed by your caregiver.  Take over-the-counter medication for itching or irritation as directed by your caregiver.  Do not have sexual intercourse while you have the infection.  Do not douche or wear tampons.  Discuss your infection with your partner, as your partner may have acquired the infection from you. Or, your partner may have been the person who transmitted the infection to you.  Have your sex partner examined and treated if necessary.  Practice safe, informed, and protected sex.  See your caregiver for other STD  testing. SEEK MEDICAL CARE IF:   You still have symptoms after you finish the medication.  You have an oral temperature above 102 F (38.9 C).  You develop belly (abdominal) pain.  You have pain when you urinate.  You have bleeding after sexual intercourse.  You develop a rash.  The medication makes you sick or makes you throw up (vomit). Document Released: 12/01/2000 Document Revised: 08/30/2011 Document Reviewed: 12/27/2008 [facility] Patient Information 2014 [place], [place].

## 2013-11-01 NOTE — ED Notes (Signed)
Pt c/o chest pain x 3 weeks, emesis and diarrhea since 0300. States she had bright red blood in stool this morning.

## 2013-11-02 LAB — GC/CHLAMYDIA PROBE AMP
CT PROBE, AMP APTIMA: NEGATIVE
GC PROBE AMP APTIMA: NEGATIVE

## 2013-11-03 LAB — URINE CULTURE

## 2013-11-04 ENCOUNTER — Telehealth (HOSPITAL_BASED_OUTPATIENT_CLINIC_OR_DEPARTMENT_OTHER): Payer: Self-pay | Admitting: Emergency Medicine

## 2013-11-04 NOTE — Telephone Encounter (Signed)
Post ED Visit - Positive Culture Follow-up  Culture report reviewed by antimicrobial stewardship pharmacist: []  Wes [place], Pharm.D., BCPS []  Heide Guile, Pharm.D., BCPS []  Alycia Rossetti, Pharm.D., BCPS []  [place], [place].D., BCPS, AAHIVP []  Legrand Como, Pharm.D., BCPS, AAHIVP []  Juliene Pina, Pharm.D. [x]  Salome Arnt, Pharm.D.  Positive urine culture Per Hyman Bible PA-C, no further treatment needed and no further patient follow-up is required at this time.  Saahir Prude 11/04/2013, 3:09 PM

## 2013-11-05 NOTE — ED Provider Notes (Signed)
Medical screening examination/treatment/procedure(s) were performed by non-physician practitioner and as supervising physician I was immediately available for consultation/collaboration.   EKG Interpretation   Date/Time:  Thursday Nov 01 2013 10:11:58 EDT Ventricular Rate:  66 PR Interval:  119 QRS Duration: 104 QT Interval:  413 QTC Calculation: 433 R Axis:   12 Text Interpretation:  Sinus rhythm Borderline short PR interval ST elev,  probable normal early repol pattern Baseline wander in lead(s) V3 ED  PHYSICIAN INTERPRETATION AVAILABLE IN [facility] Confirmed by TEST,  Record ([id number]) on 11/03/2013 10:01:04 AM       Virgel Manifold, MD 11/05/13 [phone]

## 2014-05-22 ENCOUNTER — Emergency Department (HOSPITAL_COMMUNITY): Payer: Medicaid Other

## 2014-05-22 ENCOUNTER — Emergency Department (HOSPITAL_COMMUNITY)
Admission: EM | Admit: 2014-05-22 | Discharge: 2014-05-22 | Disposition: A | Discharge status: Home / Self Care | Payer: Medicaid Other | Attending: Emergency Medicine | Admitting: Emergency Medicine

## 2014-05-22 ENCOUNTER — Encounter (HOSPITAL_COMMUNITY): Payer: Self-pay | Admitting: *Deleted

## 2014-05-22 DIAGNOSIS — R05 Cough: Secondary | ICD-10-CM

## 2014-05-22 DIAGNOSIS — I10 Essential (primary) hypertension: Secondary | ICD-10-CM | POA: Insufficient documentation

## 2014-05-22 DIAGNOSIS — Z853 Personal history of malignant neoplasm of breast: Secondary | ICD-10-CM | POA: Insufficient documentation

## 2014-05-22 DIAGNOSIS — Z79899 Other long term (current) drug therapy: Secondary | ICD-10-CM | POA: Insufficient documentation

## 2014-05-22 DIAGNOSIS — J209 Acute bronchitis, unspecified: Secondary | ICD-10-CM

## 2014-05-22 DIAGNOSIS — J45901 Unspecified asthma with (acute) exacerbation: Secondary | ICD-10-CM | POA: Insufficient documentation

## 2014-05-22 DIAGNOSIS — R058 Other specified cough: Secondary | ICD-10-CM

## 2014-05-22 LAB — CBC WITH DIFFERENTIAL/PLATELET
BASOS ABS: 0 10*3/uL (ref 0.0–0.1)
BASOS PCT: 1 % (ref 0–1)
Eosinophils Absolute: 0.2 10*3/uL (ref 0.0–0.7)
Eosinophils Relative: 3 % (ref 0–5)
HCT: 38.6 % (ref 36.0–46.0)
Hemoglobin: 12.5 g/dL (ref 12.0–15.0)
LYMPHS PCT: 26 % (ref 12–46)
Lymphs Abs: 1.8 10*3/uL (ref 0.7–4.0)
MCH: 23.9 pg — ABNORMAL LOW (ref 26.0–34.0)
MCHC: 32.4 g/dL (ref 30.0–36.0)
MCV: 73.8 fL — ABNORMAL LOW (ref 78.0–100.0)
Monocytes Absolute: 0.4 10*3/uL (ref 0.1–1.0)
Monocytes Relative: 6 % (ref 3–12)
NEUTROS ABS: 4.7 10*3/uL (ref 1.7–7.7)
NEUTROS PCT: 64 % (ref 43–77)
Platelets: 303 10*3/uL (ref 150–400)
RBC: 5.23 MIL/uL — ABNORMAL HIGH (ref 3.87–5.11)
RDW: 15.4 % (ref 11.5–15.5)
WBC: 7.1 10*3/uL (ref 4.0–10.5)

## 2014-05-22 LAB — BASIC METABOLIC PANEL
Anion gap: 17 — ABNORMAL HIGH (ref 5–15)
BUN: 11 mg/dL (ref 6–23)
CHLORIDE: 98 meq/L (ref 96–112)
CO2: 22 meq/L (ref 19–32)
Calcium: 9.1 mg/dL (ref 8.4–10.5)
Creatinine, Ser: 0.85 mg/dL (ref 0.50–1.10)
GFR calc non Af Amer: 83 mL/min — ABNORMAL LOW (ref >90)
Glucose, Bld: 89 mg/dL (ref 70–99)
POTASSIUM: 4.1 meq/L (ref 3.7–5.3)
SODIUM: 137 meq/L (ref 137–147)

## 2014-05-22 MED ORDER — IPRATROPIUM-ALBUTEROL 0.5-2.5 (3) MG/3ML IN SOLN
3.0000 mL | Freq: Once | RESPIRATORY_TRACT | Status: AC
  Administered 2014-05-22: 3 mL via RESPIRATORY_TRACT
  Filled 2014-05-22: qty 3

## 2014-05-22 MED ORDER — HYDROCOD POLST-CHLORPHEN POLST 10-8 MG/5ML PO LQCR: 5.0000 mL | Freq: Two times a day (BID) | ORAL | Status: DC | PRN

## 2014-05-22 MED ORDER — PREDNISONE 20 MG PO TABS: ORAL_TABLET | ORAL | Status: DC

## 2014-05-22 MED ORDER — SODIUM CHLORIDE 0.9 % IV BOLUS (SEPSIS)
1000.0000 mL | Freq: Once | INTRAVENOUS | Status: AC
  Administered 2014-05-22: 1000 mL via INTRAVENOUS

## 2014-05-22 MED ORDER — PREDNISONE 20 MG PO TABS
60.0000 mg | ORAL_TABLET | Freq: Once | ORAL | Status: AC
  Administered 2014-05-22: 60 mg via ORAL
  Filled 2014-05-22: qty 3

## 2014-05-22 MED ORDER — KETOROLAC TROMETHAMINE 60 MG/2ML IM SOLN
60.0000 mg | Freq: Once | INTRAMUSCULAR | Status: AC
  Administered 2014-05-22: 60 mg via INTRAMUSCULAR
  Filled 2014-05-22: qty 2

## 2014-05-22 NOTE — ED Provider Notes (Signed)
CSN: [id number]     Arrival date & time 05/22/14  0944 History   First MD Initiated Contact with Patient 05/22/14 [phone]     Chief Complaint  Patient presents with  . Cough     (Consider location/radiation/quality/duration/timing/severity/associated sxs/prior Treatment) HPI  Chelsea Hansen is a 43 y.o. female with PMH of hypertension, asthma, breast cancer presenting with one week of cough that is at times productive of thick phlegm. She also endorses runny nose and sore throat and chest tightness worse with coughing. Not with ambulation. Patient reports today because she is coughed up some blood. Patient states he was larger than a dime-sized. She has had no other episodes. Patient denies any recent surgeries or trauma, unilateral leg swelling or tenderness, history of blood clots in her legs or lungs. No recent immobilization. Malignancy was over 1 year ago. Pt never smoker.   Past Medical History  Diagnosis Date  . Hypertension   . Asthma   . Headache(784.0)   . Cancer     breast, lmpectomy   Past Surgical History  Procedure Laterality Date  . Abdominal hysterectomy    . Breast surgery      lumpectomy   Family History  Problem Relation Age of Onset  . Hypertension Father    History  Substance Use Topics  . Smoking status: Never Smoker   . Smokeless tobacco: Never Used  . Alcohol Use: Yes     Comment: occassional   OB History    No data available     Review of Systems  Constitutional: Negative for fever and chills.  HENT: Positive for congestion and rhinorrhea.   Respiratory: Positive for cough and shortness of breath.   Cardiovascular:       Chest tightness  Gastrointestinal: Negative for nausea, vomiting and diarrhea.  Musculoskeletal: Negative for back pain.  Skin: Negative for rash.  Neurological: Negative for weakness.      Allergies  Review of patient's allergies indicates no known allergies.  Home Medications   Prior to Admission medications    Medication Sig Start Date End Date Taking? Authorizing Provider  albuterol (PROVENTIL HFA;VENTOLIN HFA) 108 (90 BASE) MCG/ACT inhaler Inhale 2 puffs into the lungs every 6 (six) hours as needed for wheezing or shortness of breath.   Yes Historical Provider, MD  chlorpheniramine-HYDROcodone (TUSSIONEX PENNKINETIC ER) 10-8 MG/5ML LQCR Take 5 mLs by mouth every 12 (twelve) hours as needed for cough. 05/22/14   Pura Spice, PA-C  doxycycline (VIBRAMYCIN) 100 MG capsule Take 1 capsule (100 mg total) by mouth 2 (two) times daily. Patient not taking: Reported on 05/22/2014 11/01/13   Elwyn Lade, PA-C  metroNIDAZOLE (FLAGYL) 500 MG tablet Take 1 tablet (500 mg total) by mouth 2 (two) times daily. One po bid x 7 days Patient not taking: Reported on 05/22/2014 11/01/13   Elwyn Lade, PA-C  oxyCODONE-acetaminophen (PERCOCET/ROXICET) 5-325 MG per tablet Take 1-2 tablets by mouth every 6 (six) hours as needed for severe pain. Patient not taking: Reported on 05/22/2014 11/01/13   Elwyn Lade, PA-C  predniSONE (DELTASONE) 20 MG tablet 2 tabs po daily x 4 days 05/22/14   Pura Spice, PA-C  promethazine (PHENERGAN) 25 MG tablet Take 1 tablet (25 mg total) by mouth every 6 (six) hours as needed for nausea or vomiting. Patient not taking: Reported on 05/22/2014 11/01/13   Elwyn Lade, PA-C   BP 138/80 mmHg  Pulse 61  Temp(Src) 98.5 F (36.9 C) (Oral)  Resp  21  Ht 5\' 11"  (1.803 m)  Wt 227 lb (102.967 kg)  BMI 31.67 kg/m2  SpO2 99% Physical Exam  Constitutional: She appears well-developed and well-nourished. No distress.  HENT:  Head: Normocephalic and atraumatic.  Nose: Right sinus exhibits no maxillary sinus tenderness and no frontal sinus tenderness. Left sinus exhibits no maxillary sinus tenderness and no frontal sinus tenderness.  Mouth/Throat: Mucous membranes are normal. Posterior oropharyngeal edema and posterior oropharyngeal erythema present. No oropharyngeal exudate.  Eyes:  Conjunctivae and EOM are normal. Right eye exhibits no discharge. Left eye exhibits no discharge.  Neck: Normal range of motion. Neck supple.  Cardiovascular: Normal rate, regular rhythm and normal heart sounds.   Pulmonary/Chest: Effort normal and breath sounds normal. No stridor. No respiratory distress. She has no wheezes.  Mild tachypnea. Occasional diffuse wheeze.  Abdominal: Soft. Bowel sounds are normal. She exhibits no distension. There is no tenderness.  Lymphadenopathy:    She has no cervical adenopathy.  Neurological: She is alert.  Skin: Skin is warm and dry. She is not diaphoretic.  Nursing note and vitals reviewed.   ED Course  Procedures (including critical care time) Labs Review Labs Reviewed  CBC WITH DIFFERENTIAL - Abnormal; Notable for the following:    RBC 5.23 (*)    MCV 73.8 (*)    MCH 23.9 (*)    All other components within normal limits  BASIC METABOLIC PANEL - Abnormal; Notable for the following:    GFR calc non Af Amer 83 (*)    Anion gap 17 (*)    All other components within normal limits    Imaging Review Dg Chest 2 View  05/22/2014   CLINICAL DATA:  Cough, congestion and fever for 1 week. Initial encounter.  EXAM: CHEST  2 VIEW  COMPARISON:  11/01/2013 and 11/07/2012 radiographs.  FINDINGS: The heart size and mediastinal contours are stable with borderline cardiac enlargement. There is stable mild scarring or atelectasis at the left lung base. No edema, confluent airspace opacity or pleural effusion is present. The osseous structures appear unchanged.  IMPRESSION: Stable chest.  No acute cardiopulmonary process.   Electronically Signed   By: Camie Patience M.D.   On: 05/22/2014 11:10     EKG Interpretation None      MDM   Final diagnoses:  Productive cough  Acute bronchitis, unspecified organism   Pt with history of asthma which is likely contributing. Pt CXR negative for acute infiltrate. Patients symptoms are consistent with URI or bronchitis  likely viral etiology. Discussed that antibiotics are not indicated for viral infections. Patient low risk by well's criteria. I doubt PE. Pt will be discharged with symptomatic treatment, prednisone burst and cough suppressant. Pt to take home inhaler as needed. Verbalizes understanding and is agreeable with plan. Pt is hemodynamically stable & in NAD prior to dc.  Discussed return precautions with patient. Discussed all results and patient verbalizes understanding and agrees with plan.  Case has been discussed with Dr. Mingo Amber who agrees with the above plan and to discharge.      Pura Spice, PA-C 05/22/14 [place], MD 05/22/14 [phone]

## 2014-05-22 NOTE — ED Notes (Signed)
Reports having constant productive cough since wed and now coughing up blood. Airway intact at triage.

## 2014-05-22 NOTE — Discharge Instructions (Signed)
Return to the emergency room with worsening of symptoms, new symptoms or with symptoms that are concerning , especially fevers, stiff neck, worsening headache, nausea/vomiting, visual changes or slurred speech, chest pain, shortness of breath, cough with thick colored mucous or blood Drink plenty of fluids with electrolytes especially Gatorade. OTC cold medications such as mucinex, nyquil, dayquil are recommended. Chloraseptic for sore throat. Do not operate machinery, drive or drink alcohol while taking narcotics or muscle relaxers which includes your cough syrup.   Acute Bronchitis Bronchitis is inflammation of the airways that extend from the windpipe into the lungs (bronchi). The inflammation often causes mucus to develop. This leads to a cough, which is the most common symptom of bronchitis.  In acute bronchitis, the condition usually develops suddenly and goes away over time, usually in a couple weeks. Smoking, allergies, and asthma can make bronchitis worse. Repeated episodes of bronchitis may cause further lung problems.  CAUSES Acute bronchitis is most often caused by the same virus that causes a cold. The virus can spread from person to person (contagious) through coughing, sneezing, and touching contaminated objects. SIGNS AND SYMPTOMS   Cough.   Fever.   Coughing up mucus.   Body aches.   Chest congestion.   Chills.   Shortness of breath.   Sore throat.  DIAGNOSIS  Acute bronchitis is usually diagnosed through a physical exam. Your health care provider will also ask you questions about your medical history. Tests, such as chest X-rays, are sometimes done to rule out other conditions.  TREATMENT  Acute bronchitis usually goes away in a couple weeks. Oftentimes, no medical treatment is necessary. Medicines are sometimes given for relief of fever or cough. Antibiotic medicines are usually not needed but may be prescribed in certain situations. In some cases, an inhaler  may be recommended to help reduce shortness of breath and control the cough. A cool mist vaporizer may also be used to help thin bronchial secretions and make it easier to clear the chest.  HOME CARE INSTRUCTIONS  Get plenty of rest.   Drink enough fluids to keep your urine clear or pale yellow (unless you have a medical condition that requires fluid restriction). Increasing fluids may help thin your respiratory secretions (sputum) and reduce chest congestion, and it will prevent dehydration.   Take medicines only as directed by your health care provider.  If you were prescribed an antibiotic medicine, finish it all even if you start to feel better.  Avoid smoking and secondhand smoke. Exposure to cigarette smoke or irritating chemicals will make bronchitis worse. If you are a smoker, consider using nicotine gum or skin patches to help control withdrawal symptoms. Quitting smoking will help your lungs heal faster.   Reduce the chances of another bout of acute bronchitis by washing your hands frequently, avoiding people with cold symptoms, and trying not to touch your hands to your mouth, nose, or eyes.   Keep all follow-up visits as directed by your health care provider.  SEEK MEDICAL CARE IF: Your symptoms do not improve after 1 week of treatment.  SEEK IMMEDIATE MEDICAL CARE IF:  You develop an increased fever or chills.   You have chest pain.   You have severe shortness of breath.  You have bloody sputum.   You develop dehydration.  You faint or repeatedly feel like you are going to pass out.  You develop repeated vomiting.  You develop a severe headache. MAKE SURE YOU:   Understand these instructions.  Will watch  your condition.  Will get help right away if you are not doing well or get worse. Document Released: 07/15/2004 Document Revised: 10/22/2013 Document Reviewed: 11/28/2012 [facility] Patient Information 2015 [place], [place]. This information is not  intended to replace advice given to you by your health care provider. Make sure you discuss any questions you have with your health care provider.

## 2015-06-05 ENCOUNTER — Emergency Department (HOSPITAL_COMMUNITY)
Admission: EM | Admit: 2015-06-05 | Discharge: 2015-06-05 | Disposition: A | Discharge status: Home / Self Care | Payer: Medicaid Other | Attending: Emergency Medicine | Admitting: Emergency Medicine

## 2015-06-05 ENCOUNTER — Emergency Department (HOSPITAL_COMMUNITY): Payer: Medicaid Other

## 2015-06-05 ENCOUNTER — Encounter (HOSPITAL_COMMUNITY): Payer: Self-pay | Admitting: Emergency Medicine

## 2015-06-05 DIAGNOSIS — M545 Low back pain: Secondary | ICD-10-CM | POA: Insufficient documentation

## 2015-06-05 DIAGNOSIS — J45909 Unspecified asthma, uncomplicated: Secondary | ICD-10-CM | POA: Insufficient documentation

## 2015-06-05 DIAGNOSIS — I10 Essential (primary) hypertension: Secondary | ICD-10-CM | POA: Insufficient documentation

## 2015-06-05 DIAGNOSIS — Z853 Personal history of malignant neoplasm of breast: Secondary | ICD-10-CM | POA: Insufficient documentation

## 2015-06-05 DIAGNOSIS — R112 Nausea with vomiting, unspecified: Secondary | ICD-10-CM | POA: Insufficient documentation

## 2015-06-05 DIAGNOSIS — J069 Acute upper respiratory infection, unspecified: Secondary | ICD-10-CM | POA: Insufficient documentation

## 2015-06-05 DIAGNOSIS — Z79899 Other long term (current) drug therapy: Secondary | ICD-10-CM | POA: Insufficient documentation

## 2015-06-05 MED ORDER — ONDANSETRON HCL 4 MG PO TABS: 4.0000 mg | ORAL_TABLET | Freq: Four times a day (QID) | ORAL | Status: DC

## 2015-06-05 MED ORDER — HYDROCODONE-HOMATROPINE 5-1.5 MG/5ML PO SYRP: 5.0000 mL | ORAL_SOLUTION | Freq: Four times a day (QID) | ORAL | Status: DC | PRN

## 2015-06-05 MED ORDER — ALBUTEROL SULFATE HFA 108 (90 BASE) MCG/ACT IN AERS
2.0000 | INHALATION_SPRAY | RESPIRATORY_TRACT | Status: DC | PRN
  Administered 2015-06-05: 2 via RESPIRATORY_TRACT
  Filled 2015-06-05: qty 6.7

## 2015-06-05 MED ORDER — GUAIFENESIN 100 MG/5ML PO SOLN
10.0000 mL | Freq: Once | ORAL | Status: AC
  Administered 2015-06-05: 200 mg via ORAL
  Filled 2015-06-05: qty 10

## 2015-06-05 MED ORDER — ALBUTEROL SULFATE (2.5 MG/3ML) 0.083% IN NEBU
5.0000 mg | INHALATION_SOLUTION | Freq: Once | RESPIRATORY_TRACT | Status: AC
  Administered 2015-06-05: 5 mg via RESPIRATORY_TRACT
  Filled 2015-06-05: qty 6

## 2015-06-05 MED ORDER — IPRATROPIUM BROMIDE 0.02 % IN SOLN
0.5000 mg | Freq: Once | RESPIRATORY_TRACT | Status: AC
  Administered 2015-06-05: 0.5 mg via RESPIRATORY_TRACT
  Filled 2015-06-05: qty 2.5

## 2015-06-05 MED ORDER — AZITHROMYCIN 250 MG PO TABS: 250.0000 mg | ORAL_TABLET | Freq: Every day | ORAL | Status: DC

## 2015-06-05 NOTE — ED Provider Notes (Signed)
CSN: [id number]     Arrival date & time 06/05/15  1356 History  By signing my name below, I, Evelene Croon, attest that this documentation has been prepared under the direction and in the presence of non-physician practitioner, Montine Circle, PA-C. Electronically Signed: Evelene Croon, Scribe. 06/05/2015. 2:32 PM.     Chief Complaint  Patient presents with  . Cough   The history is provided by the patient. No language interpreter was used.     HPI Comments:  Chelsea Hansen is a 44 y.o. female with a history of asthma, who presents to the Emergency Department complaining of persistent cough for 1 month. She has taken nyquil, dayquil, theraflu, and mucinex without relief. Pt reports associated nausea, vomiting, back pain, and HA. She denies recent sick contacts. No hx of PE/DVT.     Past Medical History  Diagnosis Date  . Hypertension   . Asthma   . Headache(784.0)   . Cancer ([place])     breast, lmpectomy   Past Surgical History  Procedure Laterality Date  . Abdominal hysterectomy    . Breast surgery      lumpectomy   Family History  Problem Relation Age of Onset  . Hypertension Father    Social History  Substance Use Topics  . Smoking status: Never Smoker   . Smokeless tobacco: Never Used  . Alcohol Use: Yes     Comment: occassional   OB History    No data available     Review of Systems  Constitutional: Positive for fever (subjective). Negative for chills.  Respiratory: Positive for cough.   Gastrointestinal: Positive for nausea and vomiting.  Musculoskeletal: Positive for back pain.  All other systems reviewed and are negative.  Allergies  Review of patient's allergies indicates no known allergies.  Home Medications   Prior to Admission medications   Medication Sig Start Date End Date Taking? Authorizing Provider  albuterol (PROVENTIL HFA;VENTOLIN HFA) 108 (90 BASE) MCG/ACT inhaler Inhale 2 puffs into the lungs every 6 (six) hours as needed for wheezing or  shortness of breath.    Historical Provider, MD  chlorpheniramine-HYDROcodone (TUSSIONEX PENNKINETIC ER) 10-8 MG/5ML LQCR Take 5 mLs by mouth every 12 (twelve) hours as needed for cough. 05/22/14   Al Corpus, PA-C  doxycycline (VIBRAMYCIN) 100 MG capsule Take 1 capsule (100 mg total) by mouth 2 (two) times daily. Patient not taking: Reported on 05/22/2014 11/01/13   Cleatrice Burke, PA-C  metroNIDAZOLE (FLAGYL) 500 MG tablet Take 1 tablet (500 mg total) by mouth 2 (two) times daily. One po bid x 7 days Patient not taking: Reported on 05/22/2014 11/01/13   Cleatrice Burke, PA-C  oxyCODONE-acetaminophen (PERCOCET/ROXICET) 5-325 MG per tablet Take 1-2 tablets by mouth every 6 (six) hours as needed for severe pain. Patient not taking: Reported on 05/22/2014 11/01/13   Cleatrice Burke, PA-C  predniSONE (DELTASONE) 20 MG tablet 2 tabs po daily x 4 days 05/22/14   Al Corpus, PA-C  promethazine (PHENERGAN) 25 MG tablet Take 1 tablet (25 mg total) by mouth every 6 (six) hours as needed for nausea or vomiting. Patient not taking: Reported on 05/22/2014 11/01/13   Cleatrice Burke, PA-C   BP 164/103 mmHg  Pulse 120  Temp(Src) 97.9 F (36.6 C) (Oral)  Resp 18  SpO2 99% Physical Exam  Physical Exam  Constitutional: Pt  is oriented to person, place, and time. Appears well-developed and well-nourished. No distress.  HENT:  Head: Normocephalic and atraumatic.  Right Ear: Tympanic membrane, external ear  and ear canal normal.  Left Ear: Tympanic membrane, external ear and ear canal normal.  Nose: Mucosal edema and rhinorrhea present. No epistaxis. Right sinus exhibits no maxillary sinus tenderness and no frontal sinus tenderness. Left sinus exhibits no maxillary sinus tenderness and no frontal sinus tenderness.  Mouth/Throat: Uvula is midline and mucous membranes are normal. Mucous membranes are not pale and not cyanotic. No oropharyngeal exudate, posterior oropharyngeal edema, posterior oropharyngeal erythema  or tonsillar abscesses.  Eyes: Conjunctivae are normal. Pupils are equal, round, and reactive to light.  Neck: Normal range of motion and full passive range of motion without pain.  Cardiovascular: Normal rate and intact distal pulses.   Pulmonary/Chest: Effort normal and breath sounds normal. No stridor.  Clear and equal breath sounds without focal wheezes, rhonchi, rales  Abdominal: Soft. Bowel sounds are normal. There is no tenderness.  Musculoskeletal: Normal range of motion.  Lymphadenopathy:    Pthas no cervical adenopathy.  Neurological: Pt is alert and oriented to person, place, and time.  Skin: Skin is warm and dry. No rash noted. Pt is not diaphoretic.  Psychiatric: Normal mood and affect.  Nursing note and vitals reviewed.    ED Course  Procedures   DIAGNOSTIC STUDIES:  Oxygen Saturation is 99% on RA, normal by my interpretation.    COORDINATION OF CARE:  2:29 PM Will order breathing treatment and CXR. Discussed treatment plan with pt at bedside and pt agreed to plan.  Imaging Review Dg Chest 2 View  06/05/2015  CLINICAL DATA:  Productive cough, hemoptysis, generalized chest pain, fever 2 days ago EXAM: CHEST  2 VIEW COMPARISON:  05/22/2014 FINDINGS: Cardiomediastinal silhouette is stable. No acute infiltrate or pleural effusion. No pulmonary edema. Minimal degenerative changes lower thoracic spine. IMPRESSION: No active cardiopulmonary disease. Electronically Signed   By: Lahoma Crocker M.D.   On: 06/05/2015 15:02   I have personally reviewed and evaluated these images as part of my medical decision-making.   MDM   Final diagnoses:  URI (upper respiratory infection)    Pt CXR negative for acute infiltrate. Patients symptoms are consistent with URI, but as symptoms have persisted for 1 month will try a zpak. Pt will be discharged with symptomatic treatment.  Verbalizes understanding and is agreeable with plan. Pt is hemodynamically stable & in NAD prior to dc.   I  personally performed the services described in this documentation, which was scribed in my presence. The recorded information has been reviewed and is accurate.      Montine Circle, PA-C 06/05/15 [place], MD 06/05/15 1758

## 2015-06-05 NOTE — ED Notes (Addendum)
Pt also reports n/v, intermittent fever and headache.

## 2015-06-05 NOTE — ED Provider Notes (Signed)
Patient w gen non prod cough x 1 month. Mild sob. Hx asthma, no prior admit/icu. No fevers. +nasal congestion, sore throat. Chest sore w cough. No exertional cp. Will get cxr and neb tx. Move to room as available.     Lajean Saver, MD 06/05/15 [phone]

## 2015-06-05 NOTE — ED Notes (Signed)
Pt sts increased cough x 1 month with some blood tinged sputum; pt sts some post tussive vomiting

## 2015-06-05 NOTE — Discharge Instructions (Signed)
Upper Respiratory Infection, Adult Most upper respiratory infections (URIs) are a viral infection of the air passages leading to the lungs. A URI affects the nose, throat, and upper air passages. The most common type of URI is nasopharyngitis and is typically referred to as "the common cold." URIs run their course and usually go away on their own. Most of the time, a URI does not require medical attention, but sometimes a bacterial infection in the upper airways can follow a viral infection. This is called a secondary infection. Sinus and middle ear infections are common types of secondary upper respiratory infections. Bacterial pneumonia can also complicate a URI. A URI can worsen asthma and chronic obstructive pulmonary disease (COPD). Sometimes, these complications can require emergency medical care and may be life threatening.  CAUSES Almost all URIs are caused by viruses. A virus is a type of germ and can spread from one person to another.  RISKS FACTORS You may be at risk for a URI if:   You smoke.   You have chronic heart or lung disease.  You have a weakened defense (immune) system.   You are very young or very old.   You have nasal allergies or asthma.  You work in crowded or poorly ventilated areas.  You work in health care facilities or schools. SIGNS AND SYMPTOMS  Symptoms typically develop 2-3 days after you come in contact with a cold virus. Most viral URIs last 7-10 days. However, viral URIs from the influenza virus (flu virus) can last 14-18 days and are typically more severe. Symptoms may include:   Runny or stuffy (congested) nose.   Sneezing.   Cough.   Sore throat.   Headache.   Fatigue.   Fever.   Loss of appetite.   Pain in your forehead, behind your eyes, and over your cheekbones (sinus pain).  Muscle aches.  DIAGNOSIS  Your health care provider may diagnose a URI by:  Physical exam.  Tests to check that your symptoms are not due to  another condition such as:  Strep throat.  Sinusitis.  Pneumonia.  Asthma. TREATMENT  A URI goes away on its own with time. It cannot be cured with medicines, but medicines may be prescribed or recommended to relieve symptoms. Medicines may help:  Reduce your fever.  Reduce your cough.  Relieve nasal congestion. HOME CARE INSTRUCTIONS   Take medicines only as directed by your health care provider.   Gargle warm saltwater or take cough drops to comfort your throat as directed by your health care provider.  Use a warm mist humidifier or inhale steam from a shower to increase air moisture. This may make it easier to breathe.  Drink enough fluid to keep your urine clear or pale yellow.   Eat soups and other clear broths and maintain good nutrition.   Rest as needed.   Return to work when your temperature has returned to normal or as your health care provider advises. You may need to stay home longer to avoid infecting others. You can also use a face mask and careful hand washing to prevent spread of the virus.  Increase the usage of your inhaler if you have asthma.   Do not use any tobacco products, including cigarettes, chewing tobacco, or electronic cigarettes. If you need help quitting, ask your health care provider. PREVENTION  The best way to protect yourself from getting a cold is to practice good hygiene.   Avoid oral or hand contact with people with cold   symptoms.   Wash your hands often if contact occurs.  There is no clear evidence that vitamin C, vitamin E, echinacea, or exercise reduces the chance of developing a cold. However, it is always recommended to get plenty of rest, exercise, and practice good nutrition.  SEEK MEDICAL CARE IF:   You are getting worse rather than better.   Your symptoms are not controlled by medicine.   You have chills.  You have worsening shortness of breath.  You have brown or red mucus.  You have yellow or brown nasal  discharge.  You have pain in your face, especially when you bend forward.  You have a fever.  You have swollen neck glands.  You have pain while swallowing.  You have white areas in the back of your throat. SEEK IMMEDIATE MEDICAL CARE IF:   You have severe or persistent:  Headache.  Ear pain.  Sinus pain.  Chest pain.  You have chronic lung disease and any of the following:  Wheezing.  Prolonged cough.  Coughing up blood.  A change in your usual mucus.  You have a stiff neck.  You have changes in your:  Vision.  Hearing.  Thinking.  Mood. MAKE SURE YOU:   Understand these instructions.  Will watch your condition.  Will get help right away if you are not doing well or get worse.   This information is not intended to replace advice given to you by your health care provider. Make sure you discuss any questions you have with your health care provider.   Document Released: 12/01/2000 Document Revised: 10/22/2014 Document Reviewed: 09/12/2013 [employer] Interactive Patient Education 2016 [employer]  

## 2016-12-29 ENCOUNTER — Emergency Department (HOSPITAL_COMMUNITY): Payer: BLUE CROSS/BLUE SHIELD

## 2016-12-29 ENCOUNTER — Emergency Department (HOSPITAL_COMMUNITY)
Admission: EM | Admit: 2016-12-29 | Discharge: 2016-12-29 | Disposition: A | Discharge status: Home / Self Care | Payer: BLUE CROSS/BLUE SHIELD | Attending: Emergency Medicine | Admitting: Emergency Medicine

## 2016-12-29 ENCOUNTER — Encounter (HOSPITAL_COMMUNITY): Payer: Self-pay | Admitting: Emergency Medicine

## 2016-12-29 DIAGNOSIS — I10 Essential (primary) hypertension: Secondary | ICD-10-CM | POA: Insufficient documentation

## 2016-12-29 DIAGNOSIS — J45909 Unspecified asthma, uncomplicated: Secondary | ICD-10-CM | POA: Insufficient documentation

## 2016-12-29 DIAGNOSIS — R079 Chest pain, unspecified: Secondary | ICD-10-CM | POA: Insufficient documentation

## 2016-12-29 DIAGNOSIS — R531 Weakness: Secondary | ICD-10-CM | POA: Insufficient documentation

## 2016-12-29 DIAGNOSIS — Z853 Personal history of malignant neoplasm of breast: Secondary | ICD-10-CM | POA: Insufficient documentation

## 2016-12-29 DIAGNOSIS — R55 Syncope and collapse: Secondary | ICD-10-CM | POA: Insufficient documentation

## 2016-12-29 DIAGNOSIS — M546 Pain in thoracic spine: Secondary | ICD-10-CM | POA: Insufficient documentation

## 2016-12-29 DIAGNOSIS — Z79899 Other long term (current) drug therapy: Secondary | ICD-10-CM | POA: Insufficient documentation

## 2016-12-29 LAB — CBC
HCT: 35.7 % — ABNORMAL LOW (ref 36.0–46.0)
HEMOGLOBIN: 11.6 g/dL — AB (ref 12.0–15.0)
MCH: 23.2 pg — AB (ref 26.0–34.0)
MCHC: 32.5 g/dL (ref 30.0–36.0)
MCV: 71.3 fL — ABNORMAL LOW (ref 78.0–100.0)
PLATELETS: 279 10*3/uL (ref 150–400)
RBC: 5.01 MIL/uL (ref 3.87–5.11)
RDW: 16 % — ABNORMAL HIGH (ref 11.5–15.5)
WBC: 8.3 10*3/uL (ref 4.0–10.5)

## 2016-12-29 LAB — BASIC METABOLIC PANEL
ANION GAP: 9 (ref 5–15)
BUN: 16 mg/dL (ref 6–20)
CALCIUM: 8.9 mg/dL (ref 8.9–10.3)
CHLORIDE: 105 mmol/L (ref 101–111)
CO2: 27 mmol/L (ref 22–32)
CREATININE: 0.9 mg/dL (ref 0.44–1.00)
GFR calc non Af Amer: >60 mL/min (ref >60)
Glucose, Bld: 90 mg/dL (ref 65–99)
Potassium: 4.1 mmol/L (ref 3.5–5.1)
SODIUM: 141 mmol/L (ref 135–145)

## 2016-12-29 LAB — I-STAT TROPONIN, ED
TROPONIN I, POC: 0 ng/mL (ref 0.00–0.08)
TROPONIN I, POC: 0 ng/mL (ref 0.00–0.08)

## 2016-12-29 MED ORDER — ACETAMINOPHEN 500 MG PO TABS: 1000.0000 mg | ORAL_TABLET | Freq: Four times a day (QID) | ORAL | 0 refills | Status: DC | PRN

## 2016-12-29 MED ORDER — MORPHINE SULFATE (PF) 4 MG/ML IV SOLN
4.0000 mg | Freq: Once | INTRAVENOUS | Status: AC
  Administered 2016-12-29: 4 mg via INTRAVENOUS
  Filled 2016-12-29: qty 1

## 2016-12-29 MED ORDER — SODIUM CHLORIDE 0.9 % IV BOLUS (SEPSIS)
500.0000 mL | Freq: Once | INTRAVENOUS | Status: AC
  Administered 2016-12-29: 500 mL via INTRAVENOUS

## 2016-12-29 MED ORDER — METHOCARBAMOL 500 MG PO TABS: 1500.0000 mg | ORAL_TABLET | Freq: Four times a day (QID) | ORAL | 0 refills | Status: DC | PRN

## 2016-12-29 MED ORDER — IBUPROFEN 400 MG PO TABS: 400.0000 mg | ORAL_TABLET | Freq: Four times a day (QID) | ORAL | 0 refills | Status: DC | PRN

## 2016-12-29 MED ORDER — METHOCARBAMOL 500 MG PO TABS
1000.0000 mg | ORAL_TABLET | Freq: Once | ORAL | Status: AC
  Administered 2016-12-29: 1000 mg via ORAL
  Filled 2016-12-29: qty 2

## 2016-12-29 MED ORDER — IOPAMIDOL (ISOVUE-370) INJECTION 76%
100.0000 mL | Freq: Once | INTRAVENOUS | Status: AC | PRN
  Administered 2016-12-29: 100 mL via INTRAVENOUS

## 2016-12-29 MED ORDER — IOPAMIDOL (ISOVUE-370) INJECTION 76%
INTRAVENOUS | Status: AC
  Filled 2016-12-29: qty 100

## 2016-12-29 NOTE — ED Provider Notes (Signed)
[facility] DEPT Provider Note   CSN: [id number] Arrival date & time: 12/29/16  1239     History   Chief Complaint Chief Complaint  Patient presents with  . Chest Pain  . Weakness    HPI Chelsea Hansen is a 46 y.o. female.  The history is provided by the patient. No language interpreter was used.  Chest Pain   Associated symptoms include weakness.  Weakness  Associated symptoms include chest pain.    Chelsea Hansen is a 46 y.o. female who presents to the Emergency Department complaining of chest pain.  She reports right-sided chest pain that began at 9:30 this morning. Pain is sharp in nature and located throughout her right chest, right upper back, right shoulder and radiates down her right arm. She reports weakness due to pain in her right arm. Symptoms are constant nature. No fevers, shortness of breath, nausea, vomiting, leg swelling or pain. She states she has a history of heart attack in 2013 and was seen in the [facility] for this. She denies any history of heart cath is not sure what was done for her heart attack in the past. No family history of heart disease. She does have a history of asthma. She does not take any hormone medicines.  Past Medical History:  Diagnosis Date  . Asthma   . Cancer ([place])    breast, lmpectomy  . Headache(784.0)   . Hypertension     Patient Active Problem List   Diagnosis Date Noted  . Hypokalemia 01/02/2012  . Asthma 01/01/2012  . Bradycardia 01/01/2012  . Syncope 12/31/2011  . Mediastinal adenopathy 12/31/2011  . Anemia 12/31/2011  . Chest pain 12/31/2011    Past Surgical History:  Procedure Laterality Date  . ABDOMINAL HYSTERECTOMY    . BREAST SURGERY     lumpectomy    OB History    No data available       Home Medications    Prior to Admission medications   Medication Sig Start Date End Date Taking? Authorizing Provider  albuterol (PROVENTIL HFA;VENTOLIN HFA) 108 (90 BASE) MCG/ACT inhaler Inhale 2 puffs into  the lungs every 6 (six) hours as needed for wheezing or shortness of breath.    [provider]  azithromycin (ZITHROMAX Z-PAK) 250 MG tablet Take 1 tablet (250 mg total) by mouth daily. 500mg  PO day 1, then 250mg  PO days 205 06/05/15   Montine Circle, PA-C  chlorpheniramine-HYDROcodone [facility]) 10-8 MG/5ML LQCR Take 5 mLs by mouth every 12 (twelve) hours as needed for cough. 05/22/14   Al Corpus, PA-C  doxycycline (VIBRAMYCIN) 100 MG capsule Take 1 capsule (100 mg total) by mouth 2 (two) times daily. Patient not taking: Reported on 05/22/2014 11/01/13   Cleatrice Burke, PA-C  HYDROcodone-homatropine [facility]) 5-1.5 MG/5ML syrup Take 5 mLs by mouth every 6 (six) hours as needed for cough. 06/05/15   Montine Circle, PA-C  metroNIDAZOLE (FLAGYL) 500 MG tablet Take 1 tablet (500 mg total) by mouth 2 (two) times daily. One po bid x 7 days Patient not taking: Reported on 05/22/2014 11/01/13   Cleatrice Burke, PA-C  ondansetron (ZOFRAN) 4 MG tablet Take 1 tablet (4 mg total) by mouth every 6 (six) hours. 06/05/15   Montine Circle, PA-C  oxyCODONE-acetaminophen (PERCOCET/ROXICET) 5-325 MG per tablet Take 1-2 tablets by mouth every 6 (six) hours as needed for severe pain. Patient not taking: Reported on 05/22/2014 11/01/13   Cleatrice Burke, PA-C  predniSONE (DELTASONE) 20 MG tablet 2 tabs  po daily x 4 days 05/22/14   Al Corpus, PA-C  promethazine (PHENERGAN) 25 MG tablet Take 1 tablet (25 mg total) by mouth every 6 (six) hours as needed for nausea or vomiting. Patient not taking: Reported on 05/22/2014 11/01/13   Cleatrice Burke, PA-C    Family History Family History  Problem Relation Age of Onset  . Hypertension Father     Social History Social History  Substance Use Topics  . Smoking status: Never Smoker  . Smokeless tobacco: Never Used  . Alcohol use Yes     Comment: occassional     Allergies   Patient has no known allergies.   Review of  Systems Review of Systems  Cardiovascular: Positive for chest pain.  Neurological: Positive for weakness.  All other systems reviewed and are negative.    Physical Exam Updated Vital Signs BP (!) 137/97 (BP Location: Left Arm)   Pulse 63   Temp 98.3 F (36.8 C) (Oral)   Resp 16   SpO2 99%   Physical Exam  Constitutional: She is oriented to person, place, and time. She appears well-developed and well-nourished.  HENT:  Head: Normocephalic and atraumatic.  Cardiovascular: Normal rate and regular rhythm.   No murmur heard. Pulmonary/Chest: Effort normal and breath sounds normal. No respiratory distress.  Tenderness to palpation over the right upper chest wall. 2+ radial pulses bilaterally. Mild tenderness over the right upper shoulder and right upper arm.  Abdominal: Soft. There is no tenderness. There is no rebound and no guarding.  Musculoskeletal: She exhibits no edema or tenderness.  Neurological: She is alert and oriented to person, place, and time.  5 out of 5 strength in all 4 extremities with sensation light touch intact in all 4 extremities. There is slight decreased effort in the right upper extremity that patient attributes to pain.  Skin: Skin is warm and dry.  Psychiatric: She has a normal mood and affect. Her behavior is normal.  Nursing note and vitals reviewed.    ED Treatments / Results  Labs (all labs ordered are listed, but only abnormal results are displayed) Labs Reviewed  CBC - Abnormal; Notable for the following:       Result Value   Hemoglobin 11.6 (*)    HCT 35.7 (*)    MCV 71.3 (*)    MCH 23.2 (*)    RDW 16.0 (*)    All other components within normal limits  BASIC METABOLIC PANEL  I-STAT TROPOININ, ED    EKG  EKG Interpretation  Date/Time:  Wednesday December 29 2016 13:03:29 EDT Ventricular Rate:  65 PR Interval:    QRS Duration: 93 QT Interval:  403 QTC Calculation: 419 R Axis:   20 Text Interpretation:  Sinus rhythm Confirmed by Hazle Coca [phone]) on 12/29/2016 1:18:57 PM       Radiology Dg Chest 2 View  Result Date: 12/29/2016 CLINICAL DATA:  RIGHT chest pain.  History of breast cancer. EXAM: CHEST  2 VIEW COMPARISON:  Chest radiograph June 05, 2015 FINDINGS: Cardiomediastinal silhouette is normal. No pleural effusions or focal consolidations. Mild bronchitic changes. Trachea projects midline and there is no pneumothorax. Soft tissue planes and included osseous structures are non-suspicious. IMPRESSION: Stable examination: Mild bronchitic changes without focal consolidation. Electronically Signed   By: Elon Alas M.D.   On: 12/29/2016 13:33    Procedures Procedures (including critical care time)  Medications Ordered in ED Medications  sodium chloride 0.9 % bolus 500 mL (0 mLs Intravenous Stopped 12/29/16  1511)  morphine 4 MG/ML injection 4 mg (4 mg Intravenous Given 12/29/16 1408)     Initial Impression / Assessment and Plan / ED Course  I have reviewed the triage vital signs and the nursing notes.  Pertinent labs & imaging results that were available during my care of the patient were reviewed by me and considered in my medical decision making (see chart for details).    On record review patient without history of MI. She has been admitted with muscle skull chest pain and syncope.   Patient here for evaluation of right-sided chest pain that is very reproducible on examination. On repeat assessment she does report that she had a syncopal episode at work as well. Presentation is not consistent with PE or ACS. Given her sharp and stabbing description of pain and will obtain CTA to rule out dissection. Patient care transferred pending CTA.    Final Clinical Impressions(s) / ED Diagnoses   Final diagnoses:  None    New Prescriptions New Prescriptions   No medications on file     Quintella Reichert, MD 12/30/16 0730

## 2016-12-29 NOTE — ED Provider Notes (Signed)
Presents with chest pain. Receivedcare from Dr. Ralene Bathe, please see her notes for prior history and physical. Briefly, this is a 46yo female who presents with concern for chest pain. CT dissection study ordered.  CT dissection study negative. Second troponin negative. Significant pain with palpation, small movements to both chest and back. Suspect MSK etiology of pain, however recommend close outpatient follow up. Patient discharged in stable condition with understanding of reasons to return.    Gareth Morgan, MD 12/31/16 [phone]

## 2016-12-29 NOTE — ED Notes (Signed)
Bed: WA08 Expected date:  Expected time:  Means of arrival:  Comments: TR 4

## 2016-12-29 NOTE — ED Notes (Signed)
Patient transported to X-ray 

## 2016-12-29 NOTE — ED Notes (Signed)
Patient transported to CT 

## 2016-12-29 NOTE — ED Triage Notes (Signed)
Pt c/o chest pain and total right side pain and weakness onset today at 0930. Blurred vision throughout visual field today at 1000, since resolved. Had syncopal episode at work today about 1 hour ago. Hx MI 2013.

## 2017-07-28 ENCOUNTER — Emergency Department (HOSPITAL_COMMUNITY): Payer: Self-pay

## 2017-07-28 ENCOUNTER — Emergency Department (HOSPITAL_COMMUNITY)
Admission: EM | Admit: 2017-07-28 | Discharge: 2017-07-28 | Disposition: A | Discharge status: Home / Self Care | Payer: Self-pay | Attending: Emergency Medicine | Admitting: Emergency Medicine

## 2017-07-28 ENCOUNTER — Encounter (HOSPITAL_COMMUNITY): Payer: Self-pay | Admitting: Emergency Medicine

## 2017-07-28 ENCOUNTER — Other Ambulatory Visit: Payer: Self-pay

## 2017-07-28 DIAGNOSIS — Z853 Personal history of malignant neoplasm of breast: Secondary | ICD-10-CM | POA: Insufficient documentation

## 2017-07-28 DIAGNOSIS — B9789 Other viral agents as the cause of diseases classified elsewhere: Secondary | ICD-10-CM

## 2017-07-28 DIAGNOSIS — I10 Essential (primary) hypertension: Secondary | ICD-10-CM | POA: Insufficient documentation

## 2017-07-28 DIAGNOSIS — J45909 Unspecified asthma, uncomplicated: Secondary | ICD-10-CM | POA: Insufficient documentation

## 2017-07-28 DIAGNOSIS — J069 Acute upper respiratory infection, unspecified: Secondary | ICD-10-CM | POA: Insufficient documentation

## 2017-07-28 DIAGNOSIS — Z79899 Other long term (current) drug therapy: Secondary | ICD-10-CM | POA: Insufficient documentation

## 2017-07-28 LAB — CBC
HCT: 33.9 % — ABNORMAL LOW (ref 36.0–46.0)
Hemoglobin: 11 g/dL — ABNORMAL LOW (ref 12.0–15.0)
MCH: 23.2 pg — ABNORMAL LOW (ref 26.0–34.0)
MCHC: 32.4 g/dL (ref 30.0–36.0)
MCV: 71.4 fL — ABNORMAL LOW (ref 78.0–100.0)
Platelets: 234 10*3/uL (ref 150–400)
RBC: 4.75 MIL/uL (ref 3.87–5.11)
RDW: 15 % (ref 11.5–15.5)
WBC: 4.5 10*3/uL (ref 4.0–10.5)

## 2017-07-28 LAB — COMPREHENSIVE METABOLIC PANEL
ALT: 17 U/L (ref 14–54)
AST: 22 U/L (ref 15–41)
Albumin: 3.9 g/dL (ref 3.5–5.0)
Alkaline Phosphatase: 49 U/L (ref 38–126)
Anion gap: 6 (ref 5–15)
BUN: 10 mg/dL (ref 6–20)
CO2: 30 mmol/L (ref 22–32)
Calcium: 8.6 mg/dL — ABNORMAL LOW (ref 8.9–10.3)
Chloride: 103 mmol/L (ref 101–111)
Creatinine, Ser: 0.88 mg/dL (ref 0.44–1.00)
GFR calc Af Amer: >60 mL/min (ref >60)
GFR calc non Af Amer: >60 mL/min (ref >60)
Glucose, Bld: 101 mg/dL — ABNORMAL HIGH (ref 65–99)
Potassium: 3.3 mmol/L — ABNORMAL LOW (ref 3.5–5.1)
Sodium: 139 mmol/L (ref 135–145)
Total Bilirubin: 0.2 mg/dL — ABNORMAL LOW (ref 0.3–1.2)
Total Protein: 7.3 g/dL (ref 6.5–8.1)

## 2017-07-28 LAB — LIPASE, BLOOD: Lipase: 29 U/L (ref 11–51)

## 2017-07-28 MED ORDER — FLUTICASONE PROPIONATE 50 MCG/ACT NA SUSP: 1.0000 | Freq: Every day | NASAL | 2 refills | Status: DC

## 2017-07-28 MED ORDER — ONDANSETRON 4 MG PO TBDP: 4.0000 mg | ORAL_TABLET | Freq: Three times a day (TID) | ORAL | 0 refills | Status: DC | PRN

## 2017-07-28 MED ORDER — BENZONATATE 100 MG PO CAPS: 200.0000 mg | ORAL_CAPSULE | Freq: Three times a day (TID) | ORAL | 0 refills | Status: DC

## 2017-07-28 MED ORDER — POTASSIUM CHLORIDE CRYS ER 20 MEQ PO TBCR
40.0000 meq | EXTENDED_RELEASE_TABLET | Freq: Once | ORAL | Status: AC
  Administered 2017-07-28: 40 meq via ORAL
  Filled 2017-07-28: qty 2

## 2017-07-28 MED ORDER — PREDNISONE 10 MG PO TABS: 40.0000 mg | ORAL_TABLET | Freq: Every day | ORAL | 0 refills | Status: AC

## 2017-07-28 MED ORDER — NAPROXEN 500 MG PO TABS: 500.0000 mg | ORAL_TABLET | Freq: Two times a day (BID) | ORAL | 0 refills | Status: DC

## 2017-07-28 NOTE — ED Provider Notes (Signed)
[facility] DEPT Provider Note   CSN: [id number] Arrival date & time: 07/28/17  1215     History   Chief Complaint Chief Complaint  Patient presents with  . Flu Like Symptoms    HPI Chelsea Hansen is a 47 y.o. female with a past medical history of asthma, hypertension, who presents to ED for 5-day history of influenza-like symptoms including cough, nausea, vomiting, nasal congestion, rhinorrhea, fever with T-max 100.  Sick contacts at home and work with similar symptoms.  She has been taking Robitussin, DayQuil with no improvement in her symptoms.  She is also been using her inhalers at home with improvement in her symptoms.  She did not receive influenza vaccine this year.  She denies any abdominal pain, chest pain, shortness of breath, hemoptysis, hematemesis, lightheadedness, ear pain, headache or vision changes.  HPI  Past Medical History:  Diagnosis Date  . Asthma   . Cancer ([place])    breast, lmpectomy  . Headache(784.0)   . Hypertension     Patient Active Problem List   Diagnosis Date Noted  . Hypokalemia 01/02/2012  . Asthma 01/01/2012  . Bradycardia 01/01/2012  . Syncope 12/31/2011  . Mediastinal adenopathy 12/31/2011  . Anemia 12/31/2011  . Chest pain 12/31/2011    Past Surgical History:  Procedure Laterality Date  . ABDOMINAL HYSTERECTOMY    . BREAST SURGERY     lumpectomy    OB History    No data available       Home Medications    Prior to Admission medications   Medication Sig Start Date End Date Taking? Authorizing Provider  acetaminophen (TYLENOL) 500 MG tablet Take 2 tablets (1,000 mg total) by mouth every 6 (six) hours as needed. This is maximum dose of tylenol daily. Do not exceed this dose of tylenol from all sources (check other over the counter and prescribed medications) 12/29/16   Gareth Morgan, MD  [facility] 115-21 MCG/ACT inhaler Inhale 2 puffs into the lungs 2 (two) times daily.  12/24/16   [provider]  albuterol (PROVENTIL HFA;VENTOLIN HFA) 108 (90 BASE) MCG/ACT inhaler Inhale 2 puffs into the lungs every 6 (six) hours as needed for wheezing or shortness of breath.    [provider]  benzonatate (TESSALON) 100 MG capsule Take 2 capsules (200 mg total) by mouth every 8 (eight) hours. 07/28/17   Treyson Axel, Nicanor Alcon, PA-C  Cholecalciferol (VITAMIN D3) 2000 units capsule Take 2,000 Units by mouth daily. 11/28/16   [provider]  escitalopram (LEXAPRO) 20 MG tablet Take 20 mg by mouth daily.  12/24/16   [provider]  fluticasone (FLONASE) 50 MCG/ACT nasal spray Place 1 spray into both nostrils daily. 07/28/17   Keylani Perlstein, PA-C  ibuprofen (ADVIL,MOTRIN) 400 MG tablet Take 1 tablet (400 mg total) by mouth every 6 (six) hours as needed. 12/29/16   Gareth Morgan, MD  methocarbamol (ROBAXIN) 500 MG tablet Take 3 tablets (1,500 mg total) by mouth every 6 (six) hours as needed for muscle spasms. 12/29/16   Gareth Morgan, MD  naproxen (NAPROSYN) 500 MG tablet Take 1 tablet (500 mg total) by mouth 2 (two) times daily. 07/28/17   Tempie Gibeault, PA-C  ondansetron (ZOFRAN ODT) 4 MG disintegrating tablet Take 1 tablet (4 mg total) by mouth every 8 (eight) hours as needed for nausea or vomiting. 07/28/17   Jennife Zaucha, PA-C  pravastatin (PRAVACHOL) 20 MG tablet Take 20 mg by mouth every evening.  12/24/16   [provider]  predniSONE (DELTASONE) 10 MG tablet Take 4 tablets (40 mg total) by mouth daily for 5 days. 07/28/17 08/02/17  Delia Heady, PA-C    Family History Family History  Problem Relation Age of Onset  . Hypertension Father     Social History Social History   Tobacco Use  . Smoking status: Never Smoker  . Smokeless tobacco: Never Used  Substance Use Topics  . Alcohol use: Yes    Comment: occassional  . Drug use: No     Allergies   Patient has no known allergies.   Review of Systems Review of Systems  Constitutional: Positive for appetite  change, chills and fever.  HENT: Positive for congestion, postnasal drip, rhinorrhea and sneezing. Negative for drooling, ear pain, mouth sores, nosebleeds and sore throat.   Eyes: Negative for photophobia and visual disturbance.  Respiratory: Positive for cough. Negative for chest tightness, shortness of breath and wheezing.   Cardiovascular: Negative for chest pain and palpitations.  Gastrointestinal: Positive for nausea and vomiting. Negative for abdominal pain, blood in stool, constipation and diarrhea.  Genitourinary: Negative for dysuria, hematuria and urgency.  Musculoskeletal: Negative for myalgias.  Skin: Negative for rash.  Neurological: Negative for dizziness, tremors, weakness and light-headedness.     Physical Exam Updated Vital Signs BP 120/86 (BP Location: Left Arm)   Pulse 73   Temp 99 F (37.2 C) (Oral)   Resp 18   SpO2 94%   Physical Exam  Constitutional: She appears well-developed and well-nourished. No distress.  Nontoxic appearing and in no acute distress.  Speaking complete sentences without difficulty.  HENT:  Head: Normocephalic and atraumatic.  Right Ear: A middle ear effusion is present.  Left Ear: A middle ear effusion is present.  Nose: Mucosal edema present.  Mouth/Throat: Uvula is midline. No trismus in the jaw. No uvula swelling. No posterior oropharyngeal edema or posterior oropharyngeal erythema. No tonsillar exudate.  Postnasal drainage seen.  Eyes: Conjunctivae and EOM are normal. Pupils are equal, round, and reactive to light. Right eye exhibits no discharge. Left eye exhibits no discharge. No scleral icterus.  Neck: Normal range of motion. Neck supple.  Cardiovascular: Normal rate, regular rhythm, normal heart sounds and intact distal pulses. Exam reveals no gallop and no friction rub.  No murmur heard. Pulmonary/Chest: Effort normal and breath sounds normal. No respiratory distress.  No wheezing or respiratory distress noted.  Abdominal:  Soft. Bowel sounds are normal. She exhibits no distension. There is no tenderness. There is no guarding.  Musculoskeletal: Normal range of motion. She exhibits no edema.  Neurological: She is alert. She exhibits normal muscle tone. Coordination normal.  Skin: Skin is warm and dry. No rash noted.  Psychiatric: She has a normal mood and affect.  Nursing note and vitals reviewed.    ED Treatments / Results  Labs (all labs ordered are listed, but only abnormal results are displayed) Labs Reviewed  COMPREHENSIVE METABOLIC PANEL - Abnormal; Notable for the following components:      Result Value   Potassium 3.3 (*)    Glucose, Bld 101 (*)    Calcium 8.6 (*)    Total Bilirubin 0.2 (*)    All other components within normal limits  CBC - Abnormal; Notable for the following components:   Hemoglobin 11.0 (*)    HCT 33.9 (*)    MCV 71.4 (*)    MCH 23.2 (*)    All other components within normal limits  LIPASE, BLOOD  URINALYSIS, ROUTINE W  REFLEX MICROSCOPIC    EKG  EKG Interpretation None       Radiology Dg Chest 2 View  Result Date: 07/28/2017 CLINICAL DATA:  Productive cough for 4 days EXAM: CHEST  2 VIEW COMPARISON:  December 29, 2016 FINDINGS: The heart size and mediastinal contours are stable. Both lungs are clear. The visualized skeletal structures are stable. IMPRESSION: No active cardiopulmonary disease. Electronically Signed   By: Abelardo Diesel M.D.   On: 07/28/2017 13:44    Procedures Procedures (including critical care time)  Medications Ordered in ED Medications  potassium chloride SA (K-DUR,KLOR-CON) CR tablet 40 mEq (not administered)     Initial Impression / Assessment and Plan / ED Course  I have reviewed the triage vital signs and the nursing notes.  Pertinent labs & imaging results that were available during my care of the patient were reviewed by me and considered in my medical decision making (see chart for details).     Patient presents to ED for  evaluation of flulike symptoms for the past 5 days.  She does have a history of asthma and has been using her inhalers with improvement in her symptoms.  Here she has a mild temperature of 27F with no use of antipyretics.  She is overall well-appearing and speaking with sentences without signs of respiratory distress, airway compromise or wheezing.  Denies chest pain, shortness of breath, hemoptysis.  Chest x-ray returned as negative.  Lab work including lipase, CBC, CMP all are unremarkable with the exception of mild hypokalemia at 3.3 which patient has a history of.  I suspect viral URI or possibly influenza as a cause of her symptoms.  She is well out of the window for Tamiflu administration.  Potassium was repleted orally here.  We will instead provide symptomatic treatment with antitussives, antiemetics, steroids for asthma, anti-inflammatories and nasal spray to be taken as needed.  Patient was provided with a work note and was told not to work while having a fever.  Advised to follow-up with PCP for further evaluation.  Patient appears stable for discharge at this time.  Strict return precautions given.  Portions of this note were generated with [employer]. Dictation errors may occur despite best attempts at proofreading.  Final Clinical Impressions(s) / ED Diagnoses   Final diagnoses:  Viral URI with cough    ED Discharge Orders        Ordered    predniSONE (DELTASONE) 10 MG tablet  Daily     07/28/17 1531    benzonatate (TESSALON) 100 MG capsule  Every 8 hours     07/28/17 1531    ondansetron (ZOFRAN ODT) 4 MG disintegrating tablet  Every 8 hours PRN     07/28/17 1531    fluticasone (FLONASE) 50 MCG/ACT nasal spray  Daily     07/28/17 1531    naproxen (NAPROSYN) 500 MG tablet  2 times daily     07/28/17 1532       Delia Heady, PA-C 07/28/17 1538    Virgel Manifold, MD 07/29/17 [phone]

## 2017-07-28 NOTE — ED Triage Notes (Signed)
Pt complaint of headache, cough, n/v onset Sunday.

## 2019-09-07 ENCOUNTER — Other Ambulatory Visit: Payer: Self-pay

## 2019-09-07 ENCOUNTER — Encounter (HOSPITAL_COMMUNITY): Payer: Self-pay | Admitting: Emergency Medicine

## 2019-09-07 ENCOUNTER — Emergency Department (HOSPITAL_COMMUNITY)
Admission: EM | Admit: 2019-09-07 | Discharge: 2019-09-07 | Disposition: A | Discharge status: Home / Self Care | Payer: Medicaid Other | Attending: Emergency Medicine | Admitting: Emergency Medicine

## 2019-09-07 DIAGNOSIS — I10 Essential (primary) hypertension: Secondary | ICD-10-CM | POA: Insufficient documentation

## 2019-09-07 DIAGNOSIS — Z79899 Other long term (current) drug therapy: Secondary | ICD-10-CM | POA: Insufficient documentation

## 2019-09-07 DIAGNOSIS — J45909 Unspecified asthma, uncomplicated: Secondary | ICD-10-CM | POA: Insufficient documentation

## 2019-09-07 DIAGNOSIS — R109 Unspecified abdominal pain: Secondary | ICD-10-CM | POA: Insufficient documentation

## 2019-09-07 DIAGNOSIS — Z20822 Contact with and (suspected) exposure to covid-19: Secondary | ICD-10-CM | POA: Insufficient documentation

## 2019-09-07 LAB — URINALYSIS, ROUTINE W REFLEX MICROSCOPIC
Bilirubin Urine: NEGATIVE
Glucose, UA: NEGATIVE mg/dL
Hgb urine dipstick: NEGATIVE
Ketones, ur: NEGATIVE mg/dL
Leukocytes,Ua: NEGATIVE
Nitrite: NEGATIVE
Protein, ur: 30 mg/dL — AB
Specific Gravity, Urine: 1.021 (ref 1.005–1.030)
pH: 9 — ABNORMAL HIGH (ref 5.0–8.0)

## 2019-09-07 LAB — COMPREHENSIVE METABOLIC PANEL
ALT: 24 U/L (ref 0–44)
AST: 21 U/L (ref 15–41)
Albumin: 4.6 g/dL (ref 3.5–5.0)
Alkaline Phosphatase: 59 U/L (ref 38–126)
Anion gap: 11 (ref 5–15)
BUN: 12 mg/dL (ref 6–20)
CO2: 29 mmol/L (ref 22–32)
Calcium: 9.3 mg/dL (ref 8.9–10.3)
Chloride: 102 mmol/L (ref 98–111)
Creatinine, Ser: 0.77 mg/dL (ref 0.44–1.00)
GFR calc Af Amer: >60 mL/min (ref >60)
GFR calc non Af Amer: >60 mL/min (ref >60)
Glucose, Bld: 100 mg/dL — ABNORMAL HIGH (ref 70–99)
Potassium: 3.7 mmol/L (ref 3.5–5.1)
Sodium: 142 mmol/L (ref 135–145)
Total Bilirubin: 0.6 mg/dL (ref 0.3–1.2)
Total Protein: 8.1 g/dL (ref 6.5–8.1)

## 2019-09-07 LAB — LIPASE, BLOOD: Lipase: 23 U/L (ref 11–51)

## 2019-09-07 LAB — CBC
HCT: 39.2 % (ref 36.0–46.0)
Hemoglobin: 12.1 g/dL (ref 12.0–15.0)
MCH: 23.4 pg — ABNORMAL LOW (ref 26.0–34.0)
MCHC: 30.9 g/dL (ref 30.0–36.0)
MCV: 76 fL — ABNORMAL LOW (ref 80.0–100.0)
Platelets: 329 10*3/uL (ref 150–400)
RBC: 5.16 MIL/uL — ABNORMAL HIGH (ref 3.87–5.11)
RDW: 16.3 % — ABNORMAL HIGH (ref 11.5–15.5)
WBC: 6.4 10*3/uL (ref 4.0–10.5)
nRBC: 0 % (ref 0.0–0.2)

## 2019-09-07 LAB — I-STAT BETA HCG BLOOD, ED (MC, WL, AP ONLY): I-stat hCG, quantitative: <5 m[IU]/mL (ref <5)

## 2019-09-07 LAB — POC SARS CORONAVIRUS 2 AG -  ED: SARS Coronavirus 2 Ag: NEGATIVE

## 2019-09-07 MED ORDER — SODIUM CHLORIDE 0.9 % IV BOLUS
1000.0000 mL | Freq: Once | INTRAVENOUS | Status: AC
  Administered 2019-09-07: 16:00:00 1000 mL via INTRAVENOUS

## 2019-09-07 MED ORDER — HALOPERIDOL LACTATE 5 MG/ML IJ SOLN
2.0000 mg | Freq: Once | INTRAMUSCULAR | Status: AC
  Administered 2019-09-07: 2 mg via INTRAVENOUS
  Filled 2019-09-07: qty 1

## 2019-09-07 NOTE — ED Triage Notes (Signed)
Per pt, states she has been vomiting since last night-states blood tinged

## 2019-09-07 NOTE — Discharge Instructions (Signed)

## 2019-09-07 NOTE — ED Notes (Signed)
An After Visit Summary was printed and given to the patient. Discharge instructions given and no further questions at this time.  

## 2019-09-07 NOTE — ED Provider Notes (Signed)
[facility] DEPT Provider Note   CSN: [id number] Arrival date & time: 09/07/19  1317     History Chief Complaint  Patient presents with  . Emesis    Chelsea Hansen is a 49 y.o. female w/ HTN presenting with nausea, vomiting, and abdominal pain.  Onset yesterday with vomiting and epigastric pain overnight.  Blood tinge in vomit this morning.  No diarrhea or constipation issues.  She has had similar issues a few years ago where " I was vomiting for 10 days and then it just got better."  She denies eating anything unusual yesterday.  She reports headaches and myalgias since yesterday as well. She feels her asthma may be acting up as well.  No fevers at home. No dysuria or hematuria  PSHx;  Partial hysterectomy  HPI     Past Medical History:  Diagnosis Date  . Asthma   . Cancer ([place])    breast, lmpectomy  . Headache(784.0)   . Hypertension     Patient Active Problem List   Diagnosis Date Noted  . Hypokalemia 01/02/2012  . Asthma 01/01/2012  . Bradycardia 01/01/2012  . Syncope 12/31/2011  . Mediastinal adenopathy 12/31/2011  . Anemia 12/31/2011  . Chest pain 12/31/2011    Past Surgical History:  Procedure Laterality Date  . ABDOMINAL HYSTERECTOMY    . BREAST SURGERY     lumpectomy     OB History   No obstetric history on file.     Family History  Problem Relation Age of Onset  . Hypertension Father     Social History   Tobacco Use  . Smoking status: Never Smoker  . Smokeless tobacco: Never Used  Substance Use Topics  . Alcohol use: Yes    Comment: occassional  . Drug use: No    Home Medications Prior to Admission medications   Medication Sig Start Date End Date Taking? Authorizing Provider  acetaminophen (TYLENOL) 500 MG tablet Take 2 tablets (1,000 mg total) by mouth every 6 (six) hours as needed. This is maximum dose of tylenol daily. Do not exceed this dose of tylenol from all sources (check other over the  counter and prescribed medications) 12/29/16   Gareth Morgan, MD  ADVAIR [facility] 115-21 MCG/ACT inhaler Inhale 2 puffs into the lungs 2 (two) times daily.  12/24/16   [provider]  albuterol (PROVENTIL HFA;VENTOLIN HFA) 108 (90 BASE) MCG/ACT inhaler Inhale 2 puffs into the lungs every 6 (six) hours as needed for wheezing or shortness of breath.    [provider]  benzonatate (TESSALON) 100 MG capsule Take 2 capsules (200 mg total) by mouth every 8 (eight) hours. 07/28/17   Khatri, Nicanor Alcon, PA-C  Cholecalciferol (VITAMIN D3) 2000 units capsule Take 2,000 Units by mouth daily. 11/28/16   [provider]  escitalopram (LEXAPRO) 20 MG tablet Take 20 mg by mouth daily.  12/24/16   [provider]  fluticasone (FLONASE) 50 MCG/ACT nasal spray Place 1 spray into both nostrils daily. 07/28/17   Khatri, Hina, PA-C  ibuprofen (ADVIL,MOTRIN) 400 MG tablet Take 1 tablet (400 mg total) by mouth every 6 (six) hours as needed. 12/29/16   Gareth Morgan, MD  methocarbamol (ROBAXIN) 500 MG tablet Take 3 tablets (1,500 mg total) by mouth every 6 (six) hours as needed for muscle spasms. 12/29/16   Gareth Morgan, MD  naproxen (NAPROSYN) 500 MG tablet Take 1 tablet (500 mg total) by mouth 2 (two) times daily. 07/28/17   Delia Heady, PA-C  ondansetron (ZOFRAN ODT) 4 MG disintegrating tablet Take 1 tablet (4 mg total) by mouth every 8 (eight) hours as needed for nausea or vomiting. 07/28/17   Khatri, Hina, PA-C  pravastatin (PRAVACHOL) 20 MG tablet Take 20 mg by mouth every evening.  12/24/16   [provider]    Allergies    Patient has no known allergies.  Review of Systems   Review of Systems  Constitutional: Negative for chills and fever.  Cardiovascular: Negative for chest pain and palpitations.  Gastrointestinal: Positive for abdominal pain, nausea and vomiting.  Musculoskeletal: Positive for arthralgias and myalgias.  Skin: Negative for color change and rash.  Neurological:  Positive for headaches. Negative for syncope.  All other systems reviewed and are negative.   Physical Exam Updated Vital Signs BP (!) 142/79   Pulse 70   Temp 97.6 F (36.4 C) (Oral)   Resp 18   Ht 5\' 11"  (1.803 m)   Wt 99.8 kg   SpO2 99%   BMI 30.68 kg/m   Physical Exam Vitals and nursing note reviewed.  Constitutional:      General: She is not in acute distress.    Appearance: She is well-developed.  HENT:     Head: Normocephalic and atraumatic.  Eyes:     Conjunctiva/sclera: Conjunctivae normal.  Cardiovascular:     Rate and Rhythm: Normal rate and regular rhythm.     Heart sounds: No murmur.  Pulmonary:     Effort: Pulmonary effort is normal. No respiratory distress.  Abdominal:     General: There is no distension.     Palpations: Abdomen is soft.     Tenderness: There is abdominal tenderness in the right lower quadrant, suprapubic area and left lower quadrant. There is no guarding or rebound.  Musculoskeletal:     Cervical back: Neck supple.  Skin:    General: Skin is warm and dry.  Neurological:     General: No focal deficit present.     Mental Status: She is alert and oriented to person, place, and time.  Psychiatric:        Mood and Affect: Mood normal.        Behavior: Behavior normal.     ED Results / Procedures / Treatments   Labs (all labs ordered are listed, but only abnormal results are displayed) Labs Reviewed  COMPREHENSIVE METABOLIC PANEL - Abnormal; Notable for the following components:      Result Value   Glucose, Bld 100 (*)    All other components within normal limits  CBC - Abnormal; Notable for the following components:   RBC 5.16 (*)    MCV 76.0 (*)    MCH 23.4 (*)    RDW 16.3 (*)    All other components within normal limits  URINALYSIS, ROUTINE W REFLEX MICROSCOPIC - Abnormal; Notable for the following components:   APPearance HAZY (*)    pH 9.0 (*)    Protein, ur 30 (*)    Bacteria, UA RARE (*)    All other components  within normal limits  LIPASE, BLOOD  I-STAT BETA HCG BLOOD, ED ([facility], [facility], [facility] ONLY)  POC SARS CORONAVIRUS 2 AG -  ED    EKG None  Radiology No results found.  Procedures Procedures (including critical care time)  Medications Ordered in ED Medications  haloperidol lactate (HALDOL) injection 2 mg (2 mg Intravenous Given 09/07/19 1604)  sodium chloride 0.9 % bolus 1,000 mL (0 mLs Intravenous Stopped 09/07/19 1716)  ED Course  I have reviewed the triage vital signs and the nursing notes.  Pertinent labs & imaging results that were available during my care of the patient were reviewed by me and considered in my medical decision making (see chart for details).  49 yo female presenting with lower abdominal pain, nausea/vomiting for 2 days.  Some cramping abd pain, mild diffuse lower abdominal ttp on exam.  No focal findings suggestive of appendicitis or diverticulitis.  Suspect viral illness given her myalgias and headaches We'll try to give some fluids, Iv haldol for nausea, to improve her PO intake  Doubtful of sepsis or acute intraabdominal surgical emergency Labs unremarkable - no leukocytosis or leftward shift POC COVID negative UA unremarkable -unlikely UTI Doubtful of torsion; patient post-partial hysterectomy  Chelsea Hansen was evaluated in Emergency Department on 09/07/2019 for the symptoms described in the history of present illness. She was evaluated in the context of the global COVID-19 pandemic, which necessitated consideration that the patient might be at risk for infection with the SARS-CoV-2 virus that causes COVID-19. Institutional protocols and algorithms that pertain to the evaluation of patients at risk for COVID-19 are in a state of rapid change based on information released by regulatory bodies including the CDC and federal and state organizations. These policies and algorithms were followed during the patient's care in the ED.   Clinical Course as of Sep 06 1809    Fri Sep 07, 2019  1702 Feeling significantly better, tolerated PO, repeat abdominal exam benign.  I have a low suspicion for bowel perforation at this time. Patient wants to go home.  Will discharge   [MT]    Clinical Course User Index [MT] Jeferson Boozer, Carola Rhine, MD    Final Clinical Impression(s) / ED Diagnoses Final diagnoses:  Abdominal pain, unspecified abdominal location    Rx / DC Orders ED Discharge Orders    None       Wyvonnia Dusky, MD 09/07/19 [phone]

## 2020-05-28 ENCOUNTER — Other Ambulatory Visit: Payer: Self-pay

## 2020-05-28 ENCOUNTER — Emergency Department (HOSPITAL_COMMUNITY): Payer: Medicaid Other

## 2020-05-28 ENCOUNTER — Emergency Department (HOSPITAL_COMMUNITY)
Admission: EM | Admit: 2020-05-28 | Discharge: 2020-05-28 | Disposition: A | Discharge status: Home / Self Care | Payer: Medicaid Other | Attending: Emergency Medicine | Admitting: Emergency Medicine

## 2020-05-28 DIAGNOSIS — R059 Cough, unspecified: Secondary | ICD-10-CM | POA: Insufficient documentation

## 2020-05-28 DIAGNOSIS — R0989 Other specified symptoms and signs involving the circulatory and respiratory systems: Secondary | ICD-10-CM | POA: Insufficient documentation

## 2020-05-28 DIAGNOSIS — Z20822 Contact with and (suspected) exposure to covid-19: Secondary | ICD-10-CM | POA: Insufficient documentation

## 2020-05-28 DIAGNOSIS — J209 Acute bronchitis, unspecified: Secondary | ICD-10-CM

## 2020-05-28 DIAGNOSIS — Z7951 Long term (current) use of inhaled steroids: Secondary | ICD-10-CM | POA: Insufficient documentation

## 2020-05-28 DIAGNOSIS — J45909 Unspecified asthma, uncomplicated: Secondary | ICD-10-CM | POA: Insufficient documentation

## 2020-05-28 DIAGNOSIS — Z853 Personal history of malignant neoplasm of breast: Secondary | ICD-10-CM | POA: Insufficient documentation

## 2020-05-28 DIAGNOSIS — I10 Essential (primary) hypertension: Secondary | ICD-10-CM | POA: Insufficient documentation

## 2020-05-28 LAB — RESP PANEL BY RT-PCR (FLU A&B, COVID) ARPGX2
Influenza A by PCR: NEGATIVE
Influenza B by PCR: NEGATIVE
SARS Coronavirus 2 by RT PCR: NEGATIVE

## 2020-05-28 MED ORDER — AZITHROMYCIN 250 MG PO TABS: ORAL_TABLET | ORAL | 0 refills | Status: DC

## 2020-05-28 MED ORDER — PREDNISONE 10 MG PO TABS: 20.0000 mg | ORAL_TABLET | Freq: Two times a day (BID) | ORAL | 0 refills | Status: DC

## 2020-05-28 NOTE — Discharge Instructions (Signed)
Begin taking prednisone and Zithromax as prescribed.  Continue your albuterol inhaler, 2 puffs every 4 hours as needed.  Return to the emergency department if you develop severe chest pain, worsening breathing, or other new and concerning symptoms.

## 2020-05-28 NOTE — ED Triage Notes (Signed)
Pt POV c/o cough since Friday.  Pt reports using inhaler, mucinex, no relief.  Pt reports coughing up blood since yesterday.   Pt also c/o mid chest pain.

## 2020-05-28 NOTE — ED Provider Notes (Signed)
[facility] DEPT Provider Note   CSN: [id number] Arrival date & time: 05/28/20  1227     History Chief Complaint  Patient presents with  . Cough  . Wheezing  . Hemoptysis    Chelsea Hansen is a 49 y.o. female.  Patient is a 49 year old female with history of asthma and hypertension.  She presents today for evaluation of chest congestion, cough productive of green sputum and occasionally tinged with blood.  This has been worsening over the past 5 days.  Patient states that this usually happens when the seasons change.  She denies fevers or chills.  She denies ill contacts.  She denies any known Covid exposures.  The history is provided by the patient.  Cough Cough characteristics:  Productive Sputum characteristics:  Green Severity:  Moderate Duration:  5 days Timing:  Constant Progression:  Worsening Relieved by:  Nothing Worsened by:  Nothing Associated symptoms: wheezing   Wheezing Associated symptoms: cough        Past Medical History:  Diagnosis Date  . Asthma   . Cancer ([place])    breast, lmpectomy  . Headache(784.0)   . Hypertension     Patient Active Problem List   Diagnosis Date Noted  . Hypokalemia 01/02/2012  . Asthma 01/01/2012  . Bradycardia 01/01/2012  . Syncope 12/31/2011  . Mediastinal adenopathy 12/31/2011  . Anemia 12/31/2011  . Chest pain 12/31/2011    Past Surgical History:  Procedure Laterality Date  . ABDOMINAL HYSTERECTOMY    . BREAST SURGERY     lumpectomy     OB History   No obstetric history on file.     Family History  Problem Relation Age of Onset  . Hypertension Father     Social History   Tobacco Use  . Smoking status: Never Smoker  . Smokeless tobacco: Never Used  Substance Use Topics  . Alcohol use: Yes    Comment: occassional  . Drug use: No    Home Medications Prior to Admission medications   Medication Sig Start Date End Date Taking? Authorizing Provider  acetaminophen  (TYLENOL) 500 MG tablet Take 2 tablets (1,000 mg total) by mouth every 6 (six) hours as needed. This is maximum dose of tylenol daily. Do not exceed this dose of tylenol from all sources (check other over the counter and prescribed medications) 12/29/16   Gareth Morgan, MD  ADVAIR [facility] 115-21 MCG/ACT inhaler Inhale 2 puffs into the lungs 2 (two) times daily.  12/24/16   [provider]  albuterol (PROVENTIL HFA;VENTOLIN HFA) 108 (90 BASE) MCG/ACT inhaler Inhale 2 puffs into the lungs every 6 (six) hours as needed for wheezing or shortness of breath.    [provider]  benzonatate (TESSALON) 100 MG capsule Take 2 capsules (200 mg total) by mouth every 8 (eight) hours. 07/28/17   Khatri, Nicanor Alcon, PA-C  Cholecalciferol (VITAMIN D3) 2000 units capsule Take 2,000 Units by mouth daily. 11/28/16   [provider]  escitalopram (LEXAPRO) 20 MG tablet Take 20 mg by mouth daily.  12/24/16   [provider]  fluticasone (FLONASE) 50 MCG/ACT nasal spray Place 1 spray into both nostrils daily. 07/28/17   Khatri, Hina, PA-C  ibuprofen (ADVIL,MOTRIN) 400 MG tablet Take 1 tablet (400 mg total) by mouth every 6 (six) hours as needed. 12/29/16   Gareth Morgan, MD  methocarbamol (ROBAXIN) 500 MG tablet Take 3 tablets (1,500 mg total) by mouth every 6 (six) hours as needed for muscle spasms. 12/29/16  Gareth Morgan, MD  naproxen (NAPROSYN) 500 MG tablet Take 1 tablet (500 mg total) by mouth 2 (two) times daily. 07/28/17   Khatri, Hina, PA-C  ondansetron (ZOFRAN ODT) 4 MG disintegrating tablet Take 1 tablet (4 mg total) by mouth every 8 (eight) hours as needed for nausea or vomiting. 07/28/17   Khatri, Hina, PA-C  pravastatin (PRAVACHOL) 20 MG tablet Take 20 mg by mouth every evening.  12/24/16   [provider]    Allergies    Patient has no known allergies.  Review of Systems   Review of Systems  Respiratory: Positive for cough and wheezing.   All other systems reviewed and are  negative.   Physical Exam Updated Vital Signs BP (!) 161/78   Pulse 78   Temp 98.1 F (36.7 C) (Oral)   Resp 18   SpO2 99%   Physical Exam Vitals and nursing note reviewed.  Constitutional:      General: She is not in acute distress.    Appearance: She is well-developed. She is not diaphoretic.  HENT:     Head: Normocephalic and atraumatic.  Cardiovascular:     Rate and Rhythm: Normal rate and regular rhythm.     Heart sounds: No murmur heard.  No friction rub. No gallop.   Pulmonary:     Effort: Pulmonary effort is normal. No respiratory distress.     Breath sounds: Normal breath sounds. No wheezing.  Abdominal:     General: Bowel sounds are normal. There is no distension.     Palpations: Abdomen is soft.     Tenderness: There is no abdominal tenderness.  Musculoskeletal:        General: Normal range of motion.     Cervical back: Normal range of motion and neck supple.  Skin:    General: Skin is warm and dry.  Neurological:     Mental Status: She is alert and oriented to person, place, and time.     ED Results / Procedures / Treatments   Labs (all labs ordered are listed, but only abnormal results are displayed) Labs Reviewed  RESP PANEL BY RT-PCR (FLU A&B, COVID) [id number]    EKG None  Radiology DG Chest 2 View  Result Date: 05/28/2020 CLINICAL DATA:  wheezing EXAM: CHEST - 2 VIEW COMPARISON:  07/28/2017 and prior. FINDINGS: No focal consolidation. Mild peribronchial cuffing. No pneumothorax or pleural effusion. Cardiomediastinal silhouette is within normal limits. No acute osseous abnormality. IMPRESSION: Mild peribronchial cuffing which may reflect asthma or bronchitis. No focal consolidation. Electronically Signed   By: Primitivo Gauze M.D.   On: 05/28/2020 13:32    Procedures Procedures (including critical care time)  Medications Ordered in ED Medications - No data to display  ED Course  I have reviewed the triage vital signs and the nursing  notes.  Pertinent labs & imaging results that were available during my care of the patient were reviewed by me and considered in my medical decision making (see chart for details).    MDM Rules/Calculators/A&P  Patient presenting with complaints of chest congestion and productive cough, worsening over the past 5 days.  She has a history of asthma and states that this happens when the seasons change.  Her x-ray is clear with no hypoxia or tachycardia.  I highly doubt pulmonary embolism.  Patient states that this feels similar and consistent with prior episodes she described above.  At this point, I feel as though discharge with antibiotics, steroids, and continued use of her  inhaler is appropriate.  To return as needed for any problems.  Final Clinical Impression(s) / ED Diagnoses Final diagnoses:  None    Rx / DC Orders ED Discharge Orders    None       Veryl Speak, MD 05/28/20 [phone]

## 2020-06-21 DIAGNOSIS — C801 Malignant (primary) neoplasm, unspecified: Secondary | ICD-10-CM

## 2020-06-21 HISTORY — DX: Malignant (primary) neoplasm, unspecified: C80.1

## 2020-08-05 ENCOUNTER — Ambulatory Visit (INDEPENDENT_AMBULATORY_CARE_PROVIDER_SITE_OTHER): Payer: Medicaid Other | Admitting: Primary Care

## 2020-10-06 ENCOUNTER — Encounter (INDEPENDENT_AMBULATORY_CARE_PROVIDER_SITE_OTHER): Payer: Self-pay

## 2020-10-06 ENCOUNTER — Ambulatory Visit (INDEPENDENT_AMBULATORY_CARE_PROVIDER_SITE_OTHER): Payer: Self-pay | Admitting: Primary Care

## 2020-10-06 ENCOUNTER — Other Ambulatory Visit: Payer: Self-pay

## 2020-10-06 ENCOUNTER — Ambulatory Visit: Payer: Self-pay

## 2020-10-06 ENCOUNTER — Encounter (INDEPENDENT_AMBULATORY_CARE_PROVIDER_SITE_OTHER): Payer: Self-pay | Admitting: Primary Care

## 2020-10-06 ENCOUNTER — Encounter: Payer: Self-pay | Admitting: Gastroenterology

## 2020-10-06 VITALS — BP 147/89 | HR 64 | Temp 97.3°F | Ht 71.0 in | Wt 235.4 lb

## 2020-10-06 DIAGNOSIS — T7840XA Allergy, unspecified, initial encounter: Secondary | ICD-10-CM

## 2020-10-06 DIAGNOSIS — J4541 Moderate persistent asthma with (acute) exacerbation: Secondary | ICD-10-CM

## 2020-10-06 DIAGNOSIS — Z7689 Persons encountering health services in other specified circumstances: Secondary | ICD-10-CM

## 2020-10-06 DIAGNOSIS — F330 Major depressive disorder, recurrent, mild: Secondary | ICD-10-CM

## 2020-10-06 DIAGNOSIS — Z1211 Encounter for screening for malignant neoplasm of colon: Secondary | ICD-10-CM

## 2020-10-06 DIAGNOSIS — I10 Essential (primary) hypertension: Secondary | ICD-10-CM

## 2020-10-06 DIAGNOSIS — Z13220 Encounter for screening for lipoid disorders: Secondary | ICD-10-CM

## 2020-10-06 DIAGNOSIS — N6019 Diffuse cystic mastopathy of unspecified breast: Secondary | ICD-10-CM

## 2020-10-06 MED ORDER — ESCITALOPRAM OXALATE 20 MG PO TABS: 20.0000 mg | ORAL_TABLET | Freq: Every day | ORAL | 1 refills | Status: DC

## 2020-10-06 MED ORDER — FLUTICASONE PROPIONATE 50 MCG/ACT NA SUSP: 2.0000 | Freq: Every day | NASAL | 6 refills | Status: DC

## 2020-10-06 MED ORDER — ADVAIR HFA 115-21 MCG/ACT IN AERO: 2.0000 | INHALATION_SPRAY | Freq: Two times a day (BID) | RESPIRATORY_TRACT | 2 refills | Status: DC

## 2020-10-06 MED ORDER — HYDROCHLOROTHIAZIDE 25 MG PO TABS: 25.0000 mg | ORAL_TABLET | Freq: Every day | ORAL | 1 refills | Status: DC

## 2020-10-06 MED ORDER — AMLODIPINE BESYLATE 10 MG PO TABS: 10.0000 mg | ORAL_TABLET | Freq: Every day | ORAL | 1 refills | Status: DC

## 2020-10-06 MED ORDER — ALBUTEROL SULFATE HFA 108 (90 BASE) MCG/ACT IN AERS: 2.0000 | INHALATION_SPRAY | Freq: Four times a day (QID) | RESPIRATORY_TRACT | 2 refills | Status: DC | PRN

## 2020-10-06 MED ORDER — LORATADINE 10 MG PO TABS: 10.0000 mg | ORAL_TABLET | Freq: Every day | ORAL | 11 refills | Status: DC

## 2020-10-06 MED ORDER — MONTELUKAST SODIUM 10 MG PO TABS: 10.0000 mg | ORAL_TABLET | Freq: Every day | ORAL | 3 refills | Status: DC

## 2020-10-06 NOTE — Telephone Encounter (Signed)
Agent made pt. Appointment with Chelsea Hansen. Pt. Reports she found a lump in her right breast this past Friday. Has had one in 2012 that was surgically removed. States she is a prior pt. Of [name].  Answer Assessment - Initial Assessment Questions 1. SYMPTOM: "What's the main symptom you're concerned about?"  (e.g., lump, pain, rash, nipple discharge)     Lump 2. LOCATION: "Where is the lump located?"     Right breast 3. ONSET: "When did lump  start?"     Friday 4. PRIOR HISTORY: "Do you have any history of prior problems with your breasts?" (e.g., lumps, cancer, fibrocystic breast disease)     Yes - lump 5. CAUSE: "What do you think is causing this symptom?"     Unsure 6. OTHER SYMPTOMS: "Do you have any other symptoms?" (e.g., fever, breast pain, redness or rash, nipple discharge)     Pain to right breast 7. PREGNANCY-BREASTFEEDING: "Is there any chance you are pregnant?" "When was your last menstrual period?" "Are you breastfeeding?"     No  Protocols used: BREAST [facility]

## 2020-10-06 NOTE — Patient Instructions (Signed)

## 2020-10-06 NOTE — Progress Notes (Signed)
New Patient Office Visit  Subjective:  Patient ID: Chelsea Hansen, female    DOB: [birth year]  Age: 50 y.o. MRN: [id number]  CC:  Chief Complaint  Patient presents with  . New Patient (Initial Visit)    Mass on right breast     HPI Chelsea Hansen is 50 year old obese female who presents for management of care.  She voices concerns about a mass on her right breast and would like to have it evaluated.  Blood pressure is elevated at this appointment 147/89.Denies shortness of breath, headaches, chest pain or lower extremity edema.  Past Medical History:  Diagnosis Date  . Asthma   . 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 Headache(784.0)   . Hypertension     Past Surgical History:  Procedure Laterality Date  . ABDOMINAL HYSTERECTOMY    . BREAST SURGERY     lumpectomy    Family History  Problem Relation Age of Onset  . Hypertension Father     Social History   Socioeconomic History  . Marital status: Single    Spouse name: Not on file  . Number of children: Not on file  . Years of education: Not on file  . Highest education level: Not on file  Occupational History  . Not on file  Tobacco Use  . Smoking status: Never Smoker  . Smokeless tobacco: Never Used  Substance and Sexual Activity  . Alcohol use: Yes    Comment: occassional  . Drug use: No  . Sexual activity: Not Currently  Other Topics Concern  . Not on file  Social History Narrative  . Not on file   Social Determinants of Health   Financial Resource Strain: Not on file  Food Insecurity: Not on file  Transportation Needs: Not on file  Physical Activity: Not on file  Stress: Not on file  Social Connections: Not on file  Intimate Partner Violence: Not on file    ROS Review of Systems  HENT: Positive for congestion, postnasal drip and sneezing.   Endocrine: Positive for polydipsia.  Skin: Positive for color change.       Breast discoloration and mass right breast - noticed 10/03/20 tender with  touch   Psychiatric/Behavioral: Positive for agitation.  All other systems reviewed and are negative.   Objective:   Today's Vitals: BP (!) 147/89 (BP Location: Right Arm, Patient Position: Sitting, Cuff Size: Large)   Pulse 64   Temp (!) 97.3 F (36.3 C) (Temporal)   Ht '5\' 11"'  (1.803 m)   Wt 235 lb 6.4 oz (106.8 kg)   SpO2 97%   BMI 32.83 kg/m   Physical Exam Vitals reviewed.  Constitutional:      Appearance: She is obese.  HENT:     Right Ear: Tympanic membrane and external ear normal.     Left Ear: Tympanic membrane and external ear normal.     Nose: Nose normal.  Eyes:     Extraocular Movements: Extraocular movements intact.     Pupils: Pupils are equal, round, and reactive to light.  Cardiovascular:     Rate and Rhythm: Normal rate and regular rhythm.  Pulmonary:     Effort: Pulmonary effort is normal.     Breath sounds: Normal breath sounds.  Abdominal:     General: Abdomen is flat. Bowel sounds are normal.     Palpations: Abdomen is soft.  Musculoskeletal:        General: Normal range of motion.  Cervical back: Normal range of motion and neck supple.  Skin:    General: Skin is warm and dry.  Neurological:     Mental Status: She is oriented to person, place, and time.  Psychiatric:        Mood and Affect: Mood normal.        Behavior: Behavior normal.        Thought Content: Thought content normal.        Judgment: Judgment normal.     Assessment & Plan:  Licet was seen today for new patient (initial visit).  Diagnoses and all orders for this visit:  Moderate persistent asthma with acute exacerbation With allergies pollen asthma has been uncontrolled and was not taking long-acting beta agonist  -     ADVAIR HFA 115-21 MCG/ACT inhaler; Inhale 2 puffs into the lungs 2 (two) times daily.  Encounter to establish care Establish care was a previous provider known to patient  Allergy, initial encounter Seasonal allergies with maxillary tenderness and  left superficial lymph node tender to touch we will treat as a sinus infection. -     fluticasone (FLONASE) 50 MCG/ACT nasal spray; Place 2 sprays into both nostrils daily. -     loratadine (CLARITIN) 10 MG tablet; Take 1 tablet (10 mg total) by mouth daily.  Essential hypertension, benign Counseled on blood pressure goal of less than 130/80, low-sodium, DASH diet, medication compliance, 150 minutes of moderate intensity exercise per week. Discussed medication compliance, adverse effects.  Currently taking hydrochlorothiazide 25 mg daily.  Added amlodipine 10 mg daily to follow-up for blood pressure check -     CBC with Differential -     CMP14+EGFR -     Lipid Panel  Colon cancer screening -     Ambulatory referral to Gastroenterology  Lipid screening History of hyperlipidemia previously on pravastatin we will check lipids to determine dosage for medication.  Until then monitor fatty foods, fried foods, cheese, milk and organ meats -     Lipid Panel  Fibrocystic breast disease (FCBD), unspecified laterality Breast exam completed bilateral mass tender to palpation no nipple discharge recommending diagnostic mammogram -     MM Digital Diagnostic Bilat; Future  Mild episode of recurrent major depressive disorder ([place]) [place] Visit from 10/06/2020 in [facility]  PHQ-9 Total Score 6    PHQ score is low however asked to be placed back on Lexapro 20 mg for her anxiety and depression  Other orders -     albuterol (VENTOLIN HFA) 108 (90 Base) MCG/ACT inhaler; Inhale 2 puffs into the lungs every 6 (six) hours as needed for wheezing or shortness of breath. -     escitalopram (LEXAPRO) 20 MG tablet; Take 1 tablet (20 mg total) by mouth daily. -     montelukast (SINGULAIR) 10 MG tablet; Take 1 tablet (10 mg total) by mouth at bedtime. -     amLODipine (NORVASC) 10 MG tablet; Take 1 tablet (10 mg total) by mouth daily. -     hydrochlorothiazide (HYDRODIURIL)  25 MG tablet; Take 1 tablet (25 mg total) by mouth daily.    Outpatient Encounter Medications as of 10/06/2020  Medication Sig  . amLODipine (NORVASC) 10 MG tablet Take 1 tablet (10 mg total) by mouth daily.  . fluticasone (FLONASE) 50 MCG/ACT nasal spray Place 2 sprays into both nostrils daily.  . hydrochlorothiazide (HYDRODIURIL) 25 MG tablet Take 1 tablet (25 mg total) by mouth daily.  Marland Kitchen loratadine (  CLARITIN) 10 MG tablet Take 1 tablet (10 mg total) by mouth daily.  . montelukast (SINGULAIR) 10 MG tablet Take 1 tablet (10 mg total) by mouth at bedtime.  Marland Kitchen acetaminophen (TYLENOL) 500 MG tablet Take 2 tablets (1,000 mg total) by mouth every 6 (six) hours as needed. This is maximum dose of tylenol daily. Do not exceed this dose of tylenol from all sources (check other over the counter and prescribed medications)  . ADVAIR HFA 115-21 MCG/ACT inhaler Inhale 2 puffs into the lungs 2 (two) times daily.  Marland Kitchen albuterol (VENTOLIN HFA) 108 (90 Base) MCG/ACT inhaler Inhale 2 puffs into the lungs every 6 (six) hours as needed for wheezing or shortness of breath.  . Cholecalciferol (VITAMIN D3) 2000 units capsule Take 2,000 Units by mouth daily.  Marland Kitchen escitalopram (LEXAPRO) 20 MG tablet Take 1 tablet (20 mg total) by mouth daily.  . pravastatin (PRAVACHOL) 20 MG tablet Take 20 mg by mouth every evening.   . [DISCONTINUED] ADVAIR HFA 060-15 MCG/ACT inhaler Inhale 2 puffs into the lungs 2 (two) times daily.   . [DISCONTINUED] albuterol (PROVENTIL HFA;VENTOLIN HFA) 108 (90 BASE) MCG/ACT inhaler Inhale 2 puffs into the lungs every 6 (six) hours as needed for wheezing or shortness of breath.  . [DISCONTINUED] azithromycin (ZITHROMAX Z-PAK) 250 MG tablet 2 po day one, then 1 daily x 4 days  . [DISCONTINUED] benzonatate (TESSALON) 100 MG capsule Take 2 capsules (200 mg total) by mouth every 8 (eight) hours.  . [DISCONTINUED] escitalopram (LEXAPRO) 20 MG tablet Take 20 mg by mouth daily.   . [DISCONTINUED] fluticasone  (FLONASE) 50 MCG/ACT nasal spray Place 1 spray into both nostrils daily.  . [DISCONTINUED] ibuprofen (ADVIL,MOTRIN) 400 MG tablet Take 1 tablet (400 mg total) by mouth every 6 (six) hours as needed.  . [DISCONTINUED] methocarbamol (ROBAXIN) 500 MG tablet Take 3 tablets (1,500 mg total) by mouth every 6 (six) hours as needed for muscle spasms.  . [DISCONTINUED] naproxen (NAPROSYN) 500 MG tablet Take 1 tablet (500 mg total) by mouth 2 (two) times daily.  . [DISCONTINUED] ondansetron (ZOFRAN ODT) 4 MG disintegrating tablet Take 1 tablet (4 mg total) by mouth every 8 (eight) hours as needed for nausea or vomiting.  . [DISCONTINUED] predniSONE (DELTASONE) 10 MG tablet Take 2 tablets (20 mg total) by mouth 2 (two) times daily.   No facility-administered encounter medications on file as of 10/06/2020.    Follow-up: Return for Bp f/u and pap.   [name], NP

## 2020-10-07 LAB — CMP14+EGFR
ALT: 20 IU/L (ref 0–32)
AST: 17 IU/L (ref 0–40)
Albumin/Globulin Ratio: 1.5 (ref 1.2–2.2)
Albumin: 4.1 g/dL (ref 3.8–4.8)
Alkaline Phosphatase: 118 IU/L (ref 44–121)
BUN/Creatinine Ratio: 15 (ref 9–23)
BUN: 11 mg/dL (ref 6–24)
Bilirubin Total: 0.3 mg/dL (ref 0.0–1.2)
CO2: 25 mmol/L (ref 20–29)
Calcium: 9.4 mg/dL (ref 8.7–10.2)
Chloride: 101 mmol/L (ref 96–106)
Creatinine, Ser: 0.73 mg/dL (ref 0.57–1.00)
Globulin, Total: 2.8 g/dL (ref 1.5–4.5)
Glucose: 89 mg/dL (ref 65–99)
Potassium: 4 mmol/L (ref 3.5–5.2)
Sodium: 139 mmol/L (ref 134–144)
Total Protein: 6.9 g/dL (ref 6.0–8.5)
eGFR: 101 mL/min/{1.73_m2} (ref >59)

## 2020-10-07 LAB — CBC WITH DIFFERENTIAL/PLATELET
Basophils Absolute: 0 10*3/uL (ref 0.0–0.2)
Basos: 1 %
EOS (ABSOLUTE): 0.1 10*3/uL (ref 0.0–0.4)
Eos: 2 %
Hematocrit: 36.8 % (ref 34.0–46.6)
Hemoglobin: 11.5 g/dL (ref 11.1–15.9)
Immature Grans (Abs): 0.1 10*3/uL (ref 0.0–0.1)
Immature Granulocytes: 1 %
Lymphocytes Absolute: 1.9 10*3/uL (ref 0.7–3.1)
Lymphs: 24 %
MCH: 22.9 pg — ABNORMAL LOW (ref 26.6–33.0)
MCHC: 31.3 g/dL — ABNORMAL LOW (ref 31.5–35.7)
MCV: 73 fL — ABNORMAL LOW (ref 79–97)
Monocytes Absolute: 0.6 10*3/uL (ref 0.1–0.9)
Monocytes: 8 %
Neutrophils Absolute: 5 10*3/uL (ref 1.4–7.0)
Neutrophils: 64 %
Platelets: 323 10*3/uL (ref 150–450)
RBC: 5.02 x10E6/uL (ref 3.77–5.28)
RDW: 15.9 % — ABNORMAL HIGH (ref 11.7–15.4)
WBC: 7.7 10*3/uL (ref 3.4–10.8)

## 2020-10-07 LAB — LIPID PANEL
Chol/HDL Ratio: 4.4 ratio (ref 0.0–4.4)
Cholesterol, Total: 226 mg/dL — ABNORMAL HIGH (ref 100–199)
HDL: 51 mg/dL (ref >39)
LDL Chol Calc (NIH): 140 mg/dL — ABNORMAL HIGH (ref 0–99)
Triglycerides: 196 mg/dL — ABNORMAL HIGH (ref 0–149)
VLDL Cholesterol Cal: 35 mg/dL (ref 5–40)

## 2020-10-10 ENCOUNTER — Other Ambulatory Visit (INDEPENDENT_AMBULATORY_CARE_PROVIDER_SITE_OTHER): Payer: Self-pay | Admitting: Primary Care

## 2020-10-10 DIAGNOSIS — N6019 Diffuse cystic mastopathy of unspecified breast: Secondary | ICD-10-CM

## 2020-10-13 ENCOUNTER — Other Ambulatory Visit (INDEPENDENT_AMBULATORY_CARE_PROVIDER_SITE_OTHER): Payer: Self-pay | Admitting: Primary Care

## 2020-10-13 MED ORDER — ATORVASTATIN CALCIUM 40 MG PO TABS: 40.0000 mg | ORAL_TABLET | Freq: Every day | ORAL | 3 refills | Status: DC

## 2020-10-15 ENCOUNTER — Telehealth (INDEPENDENT_AMBULATORY_CARE_PROVIDER_SITE_OTHER): Payer: Self-pay

## 2020-10-15 NOTE — Telephone Encounter (Signed)
Patient returned call and was informed of results, dietary changes and medication being sent. She verbalized understanding. Nat Christen, CMA

## 2020-10-15 NOTE — Telephone Encounter (Signed)
-----   Message from [name], NP sent at 10/13/2020  9:48 PM EDT ----- Labs are normal except cholesterol this can increase risk of heart attack and stroke. To lower your number you can decrease your fatty foods, red meat, cheese, milk and increase fiber like whole grains and veggies.  Sent in atorvastatin 40mg  take at bedtime

## 2020-10-24 ENCOUNTER — Ambulatory Visit (AMBULATORY_SURGERY_CENTER): Payer: Self-pay

## 2020-10-24 ENCOUNTER — Other Ambulatory Visit: Payer: Self-pay

## 2020-10-24 VITALS — Ht 71.0 in | Wt 236.0 lb

## 2020-10-24 DIAGNOSIS — Z1211 Encounter for screening for malignant neoplasm of colon: Secondary | ICD-10-CM

## 2020-10-24 MED ORDER — NA SULFATE-K SULFATE-MG SULF 17.5-3.13-1.6 GM/177ML PO SOLN: 1.0000 | Freq: Once | ORAL | 0 refills | Status: AC

## 2020-10-24 NOTE — Progress Notes (Signed)
No egg or soy allergy known to patient  No issues with past sedation with any surgeries or procedures Patient denies ever being told they had issues or difficulty with intubation  No FH of Malignant Hyperthermia No diet pills per patient No home 02 use per patient  No blood thinners per patient  Pt reports issues with constipation ---will advise patient to use Miralax 5 days prior to prep No A fib or A flutter   COVID 19 guidelines implemented in PV today with Pt and RN   NO PA's for preps discussed with pt in PV today  Discussed with pt there will be an out-of-pocket cost for prep and that varies from $0 to 70 dollars   Due to the COVID-19 pandemic we are asking patients to follow certain guidelines.  Pt aware of COVID protocols and [place] guidelines  Patient care partner in pre visit room with patient at pre visit appt

## 2020-11-05 ENCOUNTER — Ambulatory Visit (INDEPENDENT_AMBULATORY_CARE_PROVIDER_SITE_OTHER): Payer: Medicaid Other | Admitting: Primary Care

## 2020-11-07 ENCOUNTER — Other Ambulatory Visit: Payer: Self-pay

## 2020-11-07 ENCOUNTER — Ambulatory Visit (AMBULATORY_SURGERY_CENTER): Payer: Self-pay | Admitting: Gastroenterology

## 2020-11-07 ENCOUNTER — Encounter: Payer: Self-pay | Admitting: Gastroenterology

## 2020-11-07 VITALS — BP 118/75 | HR 62 | Temp 97.3°F | Resp 18 | Ht 71.0 in | Wt 236.0 lb

## 2020-11-07 DIAGNOSIS — K635 Polyp of colon: Secondary | ICD-10-CM

## 2020-11-07 DIAGNOSIS — Z1211 Encounter for screening for malignant neoplasm of colon: Secondary | ICD-10-CM

## 2020-11-07 DIAGNOSIS — D123 Benign neoplasm of transverse colon: Secondary | ICD-10-CM

## 2020-11-07 MED ORDER — SODIUM CHLORIDE 0.9 % IV SOLN: 500.0000 mL | Freq: Once | INTRAVENOUS | Status: DC

## 2020-11-07 NOTE — Progress Notes (Signed)
Pt's son, called pt's driver and he was told pt would be discharged in 25 minutes.   No problems noted in the recovery room. Maw

## 2020-11-07 NOTE — Progress Notes (Signed)
Medical history reviewed with no changes noted. VS assessed by K.W

## 2020-11-07 NOTE — Progress Notes (Signed)
Report to PACU, RN, vss, BBS= Clear.  

## 2020-11-07 NOTE — Patient Instructions (Signed)
Handouts were given to your care partner on polyps, diverticulosis, and a high fiber diet with liberal fluid intake. ?You may resume your current medications today. ?Await biopsy results.  May take 1-3 weeks to receive pathology results. ?Please call if any questions or concerns. ?  ? ? ? ?YOU HAD AN ENDOSCOPIC PROCEDURE TODAY AT THE [facility]:   Refer to the procedure report that was given to you for any specific questions about what was found during the examination.  If the procedure report does not answer your questions, please call your gastroenterologist to clarify.  If you requested that your care partner not be given the details of your procedure findings, then the procedure report has been included in a sealed envelope for you to review at your convenience later. ? ?YOU SHOULD EXPECT: Some feelings of bloating in the abdomen. Passage of more gas than usual.  Walking can help get rid of the air that was put into your GI tract during the procedure and reduce the bloating. If you had a lower endoscopy (such as a colonoscopy or flexible sigmoidoscopy) you may notice spotting of blood in your stool or on the toilet paper. If you underwent a bowel prep for your procedure, you may not have a normal bowel movement for a few days. ? ?Please Note:  You might notice some irritation and congestion in your nose or some drainage.  This is from the oxygen used during your procedure.  There is no need for concern and it should clear up in a day or so. ? ?SYMPTOMS TO REPORT IMMEDIATELY: ? ?Following lower endoscopy (colonoscopy or flexible sigmoidoscopy): ? Excessive amounts of blood in the stool ? Significant tenderness or worsening of abdominal pains ? Swelling of the abdomen that is new, acute ? Fever of 100?F or higher ? ? ?For urgent or emergent issues, a gastroenterologist can be reached at any hour by calling [phone]. ?Do not use MyChart messaging for urgent concerns.  ? ? ?DIET:  We do recommend  a small meal at first, but then you may proceed to your regular diet.  Drink plenty of fluids but you should avoid alcoholic beverages for 24 hours. ? ?ACTIVITY:  You should plan to take it easy for the rest of today and you should NOT DRIVE or use heavy machinery until tomorrow (because of the sedation medicines used during the test).   ? ?FOLLOW UP: ?Our staff will call the number listed on your records 48-72 hours following your procedure to check on you and address any questions or concerns that you may have regarding the information given to you following your procedure. If we do not reach you, we will leave a message.  We will attempt to reach you two times.  During this call, we will ask if you have developed any symptoms of COVID 19. If you develop any symptoms (ie: fever, flu-like symptoms, shortness of breath, cough etc.) before then, please call [phone].  If you test positive for Covid 19 in the 2 weeks post procedure, please call and report this information to us.   ? ?If any biopsies were taken you will be contacted by phone or by letter within the next 1-3 weeks.  Please call us at [phone] if you have not heard about the biopsies in 3 weeks.  ? ? ?SIGNATURES/CONFIDENTIALITY: ?You and/or your care partner have signed paperwork which will be entered into your electronic medical record.  These signatures attest to the fact that   that the information above on your After Visit Summary has been reviewed and is understood.  Full responsibility of the confidentiality of this discharge information lies with you and/or your care-partner.

## 2020-11-07 NOTE — Progress Notes (Signed)
Called to room to assist during endoscopic procedure.  Patient ID and intended procedure confirmed with present staff. Received instructions for my participation in the procedure from the performing physician.  

## 2020-11-07 NOTE — Op Note (Signed)
[facility] Patient Name: Chelsea Hansen Procedure Date: 11/07/2020 8:02 AM MRN: [id number] Endoscopist: Ladene Artist , MD Age: 50 Referring MD:  Date of Birth: 10/01/1970 Gender: Female Account #: [id number] Procedure:                Colonoscopy Indications:              Screening for colorectal malignant neoplasm Medicines:                Monitored Anesthesia Care Procedure:                Pre-Anesthesia Assessment:                           - Prior to the procedure, a History and Physical                            was performed, and patient medications and                            allergies were reviewed. The patient's tolerance of                            previous anesthesia was also reviewed. The risks                            and benefits of the procedure and the sedation                            options and risks were discussed with the patient.                            All questions were answered, and informed consent                            was obtained. Prior Anticoagulants: The patient has                            taken no previous anticoagulant or antiplatelet                            agents. ASA Grade Assessment: II - A patient with                            mild systemic disease. After reviewing the risks                            and benefits, the patient was deemed in                            satisfactory condition to undergo the procedure.                           After obtaining informed consent, the colonoscope  was passed under direct vision. Throughout the                            procedure, the patient's blood pressure, pulse, and                            oxygen saturations were monitored continuously. The                            Colonoscope was introduced through the anus and                            advanced to the the cecum, identified by                            appendiceal orifice and  ileocecal valve. The                            ileocecal valve, appendiceal orifice, and rectum                            were photographed. The quality of the bowel                            preparation was fair. The colonoscopy was performed                            without difficulty. The patient tolerated the                            procedure well. Scope In: 8:08:00 AM Scope Out: 8:27:52 AM Scope Withdrawal Time: 0 hours 11 minutes 50 seconds  Total Procedure Duration: 0 hours 19 minutes 52 seconds  Findings:                 The perianal and digital rectal examinations were                            normal.                           A 7 mm polyp was found in the transverse colon. The                            polyp was sessile. The polyp was removed with a                            cold snare. Resection and retrieval were complete.                           Multiple medium-mouthed diverticula were found in                            the entire colon. There was no evidence of  diverticular bleeding.                           The exam was otherwise without abnormality on                            direct and retroflexion views. Complications:            No immediate complications. Estimated blood loss:                            None. Estimated Blood Loss:     Estimated blood loss: none. Impression:               - Preparation of the colon was fair.                           - One 7 mm polyp in the transverse colon, removed                            with a cold snare. Resected and retrieved.                           - Moderate diverticulosis in the entire examined                            colon.                           - The examination was otherwise normal on direct                            and retroflexion views. Recommendation:           - Repeat colonoscopy in 3 years for surveillance if                            polyp is  precancerous, and if not precancerous then                            5 years with an extended bowel prep.                           - Patient has a contact number available for                            emergencies. The signs and symptoms of potential                            delayed complications were discussed with the                            patient. Return to normal activities tomorrow.                            Written discharge instructions were provided to the  patient.                           - High fiber diet.                           - Continue present medications.                           - Await pathology results. Ladene Artist, MD 11/07/2020 8:32:05 AM This report has been signed electronically.

## 2020-11-11 ENCOUNTER — Telehealth: Payer: Self-pay | Admitting: *Deleted

## 2020-11-11 ENCOUNTER — Telehealth: Payer: Self-pay

## 2020-11-11 NOTE — Telephone Encounter (Signed)
First attempt follow up call to pt, lm on vm 

## 2020-11-11 NOTE — Telephone Encounter (Signed)
  Follow up Call-  Call back number 11/07/2020  Post procedure Call Back phone  # [phone]  Permission to leave phone message Yes  Some recent data might be hidden     Patient questions:  Do you have a fever, pain , or abdominal swelling? No. Pain Score  0 *  Have you tolerated food without any problems? Yes.    Have you been able to return to your normal activities? Yes.    Do you have any questions about your discharge instructions: Diet   No. Medications  No. Follow up visit  No.  Do you have questions or concerns about your Care? No.  Actions: * If pain score is 4 or above: No action needed, pain <4.  1. Have you developed a fever since your procedure? no  2.   Have you had an respiratory symptoms (SOB or cough) since your procedure? no  3.   Have you tested positive for COVID 19 since your procedure no  4.   Have you had any family members/close contacts diagnosed with the COVID 19 since your procedure?  no   If yes to any of these questions please route to Joylene John, RN and Joella Prince, RN

## 2020-11-18 ENCOUNTER — Ambulatory Visit
Admission: RE | Admit: 2020-11-18 | Discharge: 2020-11-18 | Disposition: A | Discharge status: Home / Self Care | Payer: Medicaid Other | Source: Ambulatory Visit | Attending: Primary Care | Admitting: Primary Care

## 2020-11-18 ENCOUNTER — Other Ambulatory Visit (INDEPENDENT_AMBULATORY_CARE_PROVIDER_SITE_OTHER): Payer: Self-pay | Admitting: Primary Care

## 2020-11-18 ENCOUNTER — Other Ambulatory Visit: Payer: Self-pay

## 2020-11-18 DIAGNOSIS — N6019 Diffuse cystic mastopathy of unspecified breast: Secondary | ICD-10-CM

## 2020-11-18 DIAGNOSIS — N6320 Unspecified lump in the left breast, unspecified quadrant: Secondary | ICD-10-CM

## 2020-11-25 ENCOUNTER — Other Ambulatory Visit: Payer: Medicaid Other

## 2020-11-25 ENCOUNTER — Ambulatory Visit
Admission: RE | Admit: 2020-11-25 | Discharge: 2020-11-25 | Disposition: A | Discharge status: Home / Self Care | Payer: Medicaid Other | Source: Ambulatory Visit | Attending: Primary Care | Admitting: Primary Care

## 2020-11-25 ENCOUNTER — Other Ambulatory Visit: Payer: Self-pay

## 2020-11-25 DIAGNOSIS — N6019 Diffuse cystic mastopathy of unspecified breast: Secondary | ICD-10-CM

## 2020-11-26 ENCOUNTER — Other Ambulatory Visit: Payer: Self-pay | Admitting: Obstetrics and Gynecology

## 2020-11-26 DIAGNOSIS — N6019 Diffuse cystic mastopathy of unspecified breast: Secondary | ICD-10-CM

## 2020-11-26 DIAGNOSIS — N6320 Unspecified lump in the left breast, unspecified quadrant: Secondary | ICD-10-CM

## 2020-11-27 ENCOUNTER — Encounter: Payer: Self-pay | Admitting: Gastroenterology

## 2020-11-28 ENCOUNTER — Other Ambulatory Visit: Payer: Medicaid Other

## 2020-12-02 ENCOUNTER — Other Ambulatory Visit: Payer: Self-pay

## 2020-12-02 ENCOUNTER — Ambulatory Visit: Payer: Self-pay | Admitting: *Deleted

## 2020-12-02 VITALS — BP 130/84 | Wt 230.3 lb

## 2020-12-02 DIAGNOSIS — N6311 Unspecified lump in the right breast, upper outer quadrant: Secondary | ICD-10-CM

## 2020-12-02 DIAGNOSIS — Z1239 Encounter for other screening for malignant neoplasm of breast: Secondary | ICD-10-CM

## 2020-12-02 DIAGNOSIS — R2231 Localized swelling, mass and lump, right upper limb: Secondary | ICD-10-CM

## 2020-12-02 NOTE — Progress Notes (Signed)
[name] is a 50 y.o. female who presents to [facility] clinic today with complaint of right breast lump since May 2022 that has increased in size. Patient states the lump is painful. Patient states the pain comes and goes. Patient rates the pain at a 7 out of 10. Patient had a diagnostic mammogram completed 11/18/2020 that two breast biopsies right breast and two left breast biopsies are recommended for follow-up.    Pap Smear: Pap smear not completed today. Last Pap smear was 06/08/2010 at [facility] [place] clinic and was normal. Per patient has no history of an abnormal Pap smear. Patient has history a hysterectomy 07/29/2010 due to fibroids. Patient doesn't need any further Pap smears due to her history of a hysterectomy for benign reasons per BCCCP and ASCCP guidelines. Last Pap smear result is available in Epic.   Physical exam: Breasts Left breast larger than right breast hasn't noticed any changes. 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 No skin abnormalities left breast. Dimpling observed right breast at 10 o'clock. No nipple retraction bilateral breasts. No nipple discharge bilateral breasts. No lymphadenopathy. No lumps palpated left breast. Palpated a lump within the right breast at 10 o'clock 6 cm from the nipple and right axilla around 10 o'clock. Complaints of tenderness when palpated right breast lump.    MM DIAG BREAST TOMO BILATERAL  Result Date: 11/18/2020 CLINICAL DATA:  50 year old with a personal history of excisional biopsy of a fibroadenoma from the [place] of the RIGHT breast in 2011, presenting with a new palpable lump near the site of the prior surgery in the RIGHT breast. Annual evaluation, LEFT breast. EXAM: DIGITAL DIAGNOSTIC BILATERAL MAMMOGRAM WITH TOMOSYNTHESIS AND CAD; ULTRASOUND RIGHT BREAST LIMITED; ULTRASOUND LEFT BREAST LIMITED TECHNIQUE: Bilateral digital diagnostic mammography and  breast tomosynthesis was performed. The images were evaluated with computer-aided detection.; Targeted ultrasound examination of the right breast was performed; Targeted ultrasound examination of the left breast was performed COMPARISON:  Previous exam(s). The patient's most recent breast imaging was in March, 2011. ACR Breast Density Category b: There are scattered areas of fibroglandular density. FINDINGS: CC and MLO views of both breasts and a spot compression tangential view of the palpable concern in the RIGHT breast were obtained. RIGHT: Corresponding to the palpable concern in the LOWER OUTER QUADRANT is a mass with irregular and microlobular margins measuring on the order of 2 cm in size, associated with architectural distortion and scattered calcifications. In the LOWER OUTER QUADRANT at middle depth is an approximate 1 cm isodense mass without associated architectural distortion or suspicious calcifications, new since the 2011 mammogram. There may be a satellite nodule just medial to this mass. Targeted ultrasound is performed, demonstrating an irregular hypoechoic mass with a hyperechoic halo at the 10 o'clock position approximately 6 cm from nipple measuring approximately 3.2 x 1.7 x 2 5 cm, demonstrating posterior acoustic shadowing and demonstrating internal power Doppler flow, corresponding to the palpable concern. At the 9:30 o'clock position approximately 8 cm from the nipple at middle depth is an oval parallel hypoechoic mass with predominantly circumscribed margins, though the lateral margin is somewhat vague, measuring approximately 1.1 x 0.5 x 1.1 cm, demonstrating no posterior characteristics and no internal power Doppler flow, corresponding to the mammographic mass. A satellite nodule is not identified adjacent to this mass. Sonographic evaluation of the RIGHT axilla demonstrates a solitary lymph node with eccentric cortical thickening up to 7  mm. LEFT: In the UPPER OUTER QUADRANT at posterior  depth is a mass with irregular margins on the order of 1 cm in size, associated with architectural distortion. In the INNER breast at anterior to middle depth is a circumscribed isodense mass measuring approximately 0.4 cm without associated architectural distortion or suspicious calcifications. Targeted ultrasound is performed, demonstrating an irregular hypoechoic mass with a hyperechoic halo at the 2 o'clock position approximately 10 cm from the nipple measuring approximately 0.8 x 0.7 x 0.5 cm, demonstrating posterior acoustic shadowing and demonstrating peripheral power Doppler flow, corresponding to the mammographic mass. At the 10 o'clock position approximately 6 cm from the nipple are benign clustered cysts measuring approximately 0.3 x 0.3 x 0.2 cm, demonstrating posterior acoustic enhancement and no internal power Doppler flow, corresponding to the mammographic mass. Sonographic evaluation of the LEFT axilla demonstrates no pathologic lymphadenopathy. IMPRESSION: 1. Highly suspicious 3.2 cm mass associated with architectural distortion and scattered calcifications in the UPPER OUTER QUADRANT of the RIGHT breast at the 10 o'clock position 6 cm from the nipple, corresponding to the palpable concern. 2. Indeterminate new 1.1 cm mass in the UPPER OUTER QUADRANT of the RIGHT breast at the 9:30 o'clock position 8 cm from the nipple. 3. Solitary pathologic RIGHT axillary lymph node with eccentric cortical thickening up to 7 mm. 4. Highly suspicious 0.8 cm mass involving the UPPER OUTER QUADRANT of the LEFT breast. 5. No pathologic LEFT axillary lymphadenopathy. RECOMMENDATION: 1. Ultrasound-guided core needle biopsy of the highly suspicious 3.2 cm RIGHT breast mass, the indeterminate 1.1 cm RIGHT breast mass, and the solitary pathologic RIGHT axillary lymph node. 2. Ultrasound-guided core needle biopsy of the highly suspicious LEFT breast mass. The ultrasound core needle biopsy procedure was discussed with  patient and her questions were answered. She wishes to proceed with the biopsies which have been scheduled by the [facility] staff. I have discussed the findings and recommendations with the patient. BI-RADS CATEGORY  5: Highly suggestive of malignancy. Electronically Signed   By: Evangeline Dakin M.D.   On: 11/18/2020 10:13    Pelvic/Bimanual Pap is not indicated today per BCCCP guidelines.   Smoking History: Patient has never smoked.    Patient Navigation: Patient education provided. Access to services provided for patient through [facility] program.   Colorectal Cancer Screening: Per patient has had colonoscopy completed on 11/07/2020 at [place].  No complaints today.    Breast and Cervical Cancer Risk Assessment: Patient has family history of her mother having breast cancer. Patient has no known genetic mutations or history of radiation treatment to the chest before age 54. Patient does not have history of cervical dysplasia, immunocompromised, or DES exposure in-utero.  Risk Assessment     Risk Scores       12/02/2020   Last edited by: Loletta Parish, RN   5-year risk: 1.7 %   Lifetime risk: 13.9 %            A: BCCCP exam without pap smear Complaint of right breast lump and pain.  P: Referred patient to the [place] for bilateral breast biopsies per recommendation. Appointments scheduled Wednesday, December 03, 2020 at 1315 and Thursday, December 04, 2020 at 0730.  Loletta Parish, RN 12/02/2020 10:32 AM

## 2020-12-02 NOTE — Patient Instructions (Signed)
Explained breast self awareness with Leighton Parody. Patient did not need a Pap smear today due to patient has a history of a hysterectomy for benign reasons. Let patient know that she doesn't need any further Pap smears due to her history of a hysterectomy for benign reasons. Referred patient to the [facility] for bilateral breast biopsies per recommendation. Appointments scheduled Wednesday, December 03, 2020 at 1315 and Thursday, December 04, 2020 at 0730. Patient aware of appointments and will be there. Leighton Parody verbalized understanding.  Lawanda Holzheimer, Arvil [place], RN 10:32 AM

## 2020-12-03 ENCOUNTER — Ambulatory Visit
Admission: RE | Admit: 2020-12-03 | Discharge: 2020-12-03 | Disposition: A | Discharge status: Home / Self Care | Payer: No Typology Code available for payment source | Source: Ambulatory Visit | Attending: Obstetrics and Gynecology | Admitting: Obstetrics and Gynecology

## 2020-12-03 ENCOUNTER — Other Ambulatory Visit: Payer: Self-pay | Admitting: Obstetrics and Gynecology

## 2020-12-03 ENCOUNTER — Ambulatory Visit
Admission: RE | Admit: 2020-12-03 | Discharge: 2020-12-03 | Disposition: A | Discharge status: Home / Self Care | Payer: Medicaid Other | Source: Ambulatory Visit | Attending: Obstetrics and Gynecology | Admitting: Obstetrics and Gynecology

## 2020-12-03 DIAGNOSIS — N6019 Diffuse cystic mastopathy of unspecified breast: Secondary | ICD-10-CM

## 2020-12-03 DIAGNOSIS — N6320 Unspecified lump in the left breast, unspecified quadrant: Secondary | ICD-10-CM

## 2020-12-04 ENCOUNTER — Other Ambulatory Visit: Payer: Medicaid Other

## 2020-12-04 ENCOUNTER — Encounter: Payer: Self-pay | Admitting: *Deleted

## 2020-12-04 ENCOUNTER — Telehealth: Payer: Self-pay | Admitting: Oncology

## 2020-12-04 NOTE — Telephone Encounter (Signed)
Spoke to patient to confirm morning clinic appointment for 6/29, packet will be mailed

## 2020-12-12 ENCOUNTER — Encounter: Payer: Self-pay | Admitting: *Deleted

## 2020-12-12 DIAGNOSIS — Z170 Estrogen receptor positive status [ER+]: Secondary | ICD-10-CM

## 2020-12-12 DIAGNOSIS — C50412 Malignant neoplasm of upper-outer quadrant of left female breast: Secondary | ICD-10-CM

## 2020-12-12 DIAGNOSIS — C50411 Malignant neoplasm of upper-outer quadrant of right female breast: Secondary | ICD-10-CM | POA: Insufficient documentation

## 2020-12-12 DIAGNOSIS — C50919 Malignant neoplasm of unspecified site of unspecified female breast: Secondary | ICD-10-CM | POA: Insufficient documentation

## 2020-12-16 ENCOUNTER — Telehealth: Payer: Self-pay

## 2020-12-16 NOTE — Telephone Encounter (Signed)
BCCCP Medicaid application sent to [employer].

## 2020-12-16 NOTE — Progress Notes (Signed)
[place]  [phone] [phone]     ID: Chelsea Hansen DOB: [birth year]  MR#: [id number]  ZWC#:[id number]  Patient Care Team: [name], NP as PCP - General (Internal Medicine) [name], RN as Oncology Nurse Navigator [name], RN as Oncology Nurse Navigator [name], MD as Consulting Physician (Radiation Oncology) Kendarrius Tanzi, Virgie Dad, MD as Consulting Physician (Oncology) [name], MD as Consulting Physician (General Surgery) [name], MD OTHER MD:  CHIEF COMPLAINT: Bilateral estrogen receptor positive breast cancer  CURRENT TREATMENT: Awaiting definitive surgery   HISTORY OF CURRENT ILLNESS: Chelsea Hansen has a history of excisional biopsy of a fibroadenoma from the lower outer right breast in 2011.  More recently she noted a knot in her right breast.  She brought it to medical attention and proceeded to bilateral diagnostic mammography with tomography and bilateral breast ultrasonography at [facility] on 11/18/2020 showing: breast density category B;   RIGHT: 3.2 cm mass associated with architectural distortion and scattered calcifications in upper-outer right breast at 10 o'clock, corresponding to palpable concern; indeterminate 1.1 cm mass in upper-outer right breast at 9:30; solitary pathologic right axillary lymph node with eccentric cortical thickening up to 7 mm. LEFT: 0.8 cm mass involving upper-outer left breast at 2 o'clock; no pathologic left axillary lymphadenopathy.  Accordingly on 12/03/2020 she proceeded to biopsy of the bilateral breast areas in question. The pathology from this procedure ([id number]) showed:  Right Breast, 9:30 fibroadenoma Right Breast, 10 o'clock Invasive and in situ mammary carcinoma, e-cadherin negative, grade 2 Prognostic indicators significant for: estrogen receptor, 95% positive and progesterone receptor, 100% positive, both with strong staining  intensity. Proliferation marker Ki67 at 40%. HER2 negative by immunohistochemistry (0). 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 Grade or-2  Prognostic indicators significant for: estrogen receptor, 95% positive and progesterone receptor, 100% positive, both with strong staining intensity. Proliferation marker Ki67 at 15%. HER2 negative by immunohistochemistry (1+).  Cancer Staging Malignant neoplasm of upper-outer quadrant of left breast in female, estrogen receptor positive ([place]) Staging form: Breast, AJCC 8th Edition - Clinical stage from 12/17/2020: cT1b, cN0, cM0, ER+, PR+, HER2- - Signed by [name], MD on 12/17/2020 Stage prefix: Initial diagnosis Laterality: Left Staged by: Pathologist and managing physician Stage used in treatment planning: Yes National guidelines used in treatment planning: Yes Type of national guideline used in treatment planning: NCCN  Malignant neoplasm of upper-outer quadrant of right breast in female, estrogen receptor positive ([place]) Staging form: Breast, AJCC 8th Edition - Clinical stage from 12/17/2020: cT2, cN1, cM0, ER+, PR+, HER2- - Signed by [name], MD on 12/17/2020 Stage prefix: Initial diagnosis Laterality: Right Staged by: Pathologist and managing physician Stage used in treatment planning: Yes National guidelines used in treatment planning: Yes Type of national guideline used in treatment planning: NCCN  The patient's subsequent history is as detailed below.   INTERVAL HISTORY: Chelsea Hansen was evaluated in the multidisciplinary breast cancer clinic on 12/17/2020 accompanied by her daughter Chelsea Hansen. Her case was also presented at the multidisciplinary breast cancer conference on the same day. At that time a preliminary plan was proposed: MammaPrint to be obtained  on the right sided tumor from the original biopsy, MRI, definitive surgery with breast conservation if possible and targeted axillary dissection, adjuvant radiation on the right and possibly  on the left, genetics, and antiestrogens   REVIEW OF SYSTEMS: There were no specific symptoms leading to the original mammogram, which was routinely scheduled. On the provided questionnaire, Chelsea Hansen's only complaints today are her palpable breast lump and associated breast pain. The patient denies unusual headaches, visual changes, nausea, vomiting, stiff neck, dizziness, or gait imbalance. There has been no cough, phlegm production, or pleurisy, no chest pain or pressure, and no change in bowel or bladder habits. The patient denies fever, rash, bleeding, unexplained fatigue or unexplained weight loss. A detailed review of systems was otherwise entirely negative.   COVID 19 VACCINATION STATUS: [place] x2, most recently 10/2020, no booster as of June 2022   PAST MEDICAL HISTORY: Past Medical History:  Diagnosis Date   Allergy    seasonal allergies   Anxiety    on meds   Asthma    uses inhaler   Breast cancer ([place]) 2012   RIGHT lumpectomy   Cancer ([place]) 2022   RIGHT breast lump-dx 2022   Depression    on meds   DVT (deep venous thrombosis) ([place]) 2010   after hysterectomy   GERD (gastroesophageal reflux disease)    with certain foods/OTC PRN meds   Headache(784.0)    Hyperlipidemia    on meds   Hypertension    on meds    PAST SURGICAL HISTORY: Past Surgical History:  Procedure Laterality Date   ABDOMINAL HYSTERECTOMY     BREAST EXCISIONAL BIOPSY Right 09/16/2009   BREAST SURGERY     lumpectomy   TUBAL LIGATION    Patient's ovaries are still in place, status post hysterectomy 08/18/2010 with benign pathology  FAMILY HISTORY: Family History  Problem Relation Age of Onset   Cancer Mother        ?   Hypertension Father    Colon polyps Neg Hx    Colon cancer Neg Hx    Esophageal cancer  Neg Hx    Stomach cancer Neg Hx    Her father is age 41 and her mother age 102 as of 11/2020. Her mother has a history of cancer (type unsure).  The patient's mother had two sisters also with cancer, one with uterine and the other cancer type is unknown to the patient. Ronetta has 1 brother and 1 sister.    GYNECOLOGIC HISTORY:  No LMP recorded. Patient has had a hysterectomy. Menarche: 50 years old Age at first live birth: 50 years old [place] P 5 LMP 2012, with hysterectomy Contraceptive never used HRT never used  Hysterectomy? Yes, 07/2010 for fibroids (pathology in Epic) BSO? 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 She is single and lives at home with her three sons. Her two daughters also live here in [place]. Daughter Jaylianna Tatlock, age 49, works at [employer]. Daughter Jilda Panda Sample, age 8, and son Isiah Nils Pyle, age 16, work at [employer]. Son Sharlee Blew, age 69, and daughter Alvera Novel, age 38, are currently not employed. Jeniffer is not a Designer, fashion/clothing.    ADVANCED DIRECTIVES: not in place   HEALTH MAINTENANCE: Social History   Tobacco Use   Smoking status: Never   Smokeless tobacco: Never  Vaping Use   Vaping Use: Never used  Substance Use Topics   Alcohol use: Yes    Alcohol/week: 7.0 standard drinks    Types: 7 Standard drinks or equivalent per week    Comment: occassional   Drug use: No     Colonoscopy: 10/2020 (  Dr. Fuller Plan), recall 2027  PAP: 05/2010, negative, repeat not indicated (s/p hysterectomy)  Bone density: n/a (age)   No Known Allergies  Current Outpatient Medications  Medication Sig Dispense Refill   albuterol (VENTOLIN HFA) 108 (90 Base) MCG/ACT inhaler Inhale 2 puffs into the lungs every 6 (six) hours as needed for wheezing or shortness of breath. 18 g 2   hydrochlorothiazide (HYDRODIURIL) 25 MG tablet Take 1 tablet (25 mg total) by mouth daily. 90 tablet 1   acetaminophen (TYLENOL) 500 MG  tablet Take 2 tablets (1,000 mg total) by mouth every 6 (six) hours as needed. This is maximum dose of tylenol daily. Do not exceed this dose of tylenol from all sources (check other over the counter and prescribed medications) (Patient not taking: No sig reported) 30 tablet 0   ADVAIR HFA 115-21 MCG/ACT inhaler Inhale 2 puffs into the lungs 2 (two) times daily. 1 each 2   amLODipine (NORVASC) 10 MG tablet Take 1 tablet (10 mg total) by mouth daily. (Patient not taking: No sig reported) 90 tablet 1   atorvastatin (LIPITOR) 40 MG tablet Take 1 tablet (40 mg total) by mouth daily. (Patient not taking: Reported on 12/02/2020) 90 tablet 3   Cholecalciferol (VITAMIN D3) 2000 units capsule Take 2,000 Units by mouth daily.  11   escitalopram (LEXAPRO) 20 MG tablet Take 1 tablet (20 mg total) by mouth daily. 90 tablet 1   fluticasone (FLONASE) 50 MCG/ACT nasal spray Place 2 sprays into both nostrils daily. (Patient not taking: No sig reported) 16 g 6   loratadine (CLARITIN) 10 MG tablet Take 1 tablet (10 mg total) by mouth daily. 30 tablet 11   montelukast (SINGULAIR) 10 MG tablet Take 1 tablet (10 mg total) by mouth at bedtime. (Patient not taking: No sig reported) 90 tablet 3   No current facility-administered medications for this visit.    OBJECTIVE: African-American woman who appears stated age  50:   12/17/20 0851  BP: (!) 146/81  Pulse: 67  Resp: 18  Temp: 97.7 F (36.5 C)  SpO2: 100%     Body mass index is 31.69 kg/m.   Wt Readings from Last 3 Encounters:  12/17/20 227 lb 3.2 oz (103.1 kg)  12/02/20 230 lb 4.8 oz (104.5 kg)  11/07/20 236 lb (107 kg)      ECOG FS:1 - Symptomatic but completely ambulatory  Ocular: Sclerae unicteric, pupils round and equal Ear-nose-throat: Wearing a mask Lymphatic: No cervical or supraclavicular adenopathy Lungs no rales or rhonchi Heart regular rate and rhythm Abd soft, nontender, positive bowel sounds MSK no focal spinal tenderness, no joint  edema Neuro: non-focal, well-oriented, appropriate affect Breasts:    LAB RESULTS:  CMP     Component Value Date/Time   NA 140 12/17/2020 0831   NA 139 10/06/2020 1432   K 3.4 (L) 12/17/2020 0831   CL 106 12/17/2020 0831   CO2 24 12/17/2020 0831   GLUCOSE 118 (H) 12/17/2020 0831   BUN 12 12/17/2020 0831   BUN 11 10/06/2020 1432   CREATININE 0.91 12/17/2020 0831   CALCIUM 9.2 12/17/2020 0831   PROT 7.4 12/17/2020 0831   PROT 6.9 10/06/2020 1432   ALBUMIN 3.4 (L) 12/17/2020 0831   ALBUMIN 4.1 10/06/2020 1432   AST 15 12/17/2020 0831   ALT 18 12/17/2020 0831   ALKPHOS 125 12/17/2020 0831   BILITOT 0.3 12/17/2020 0831   GFRNONAA >60 12/17/2020 0831   GFRAA >60 09/07/2019 1340    Lab Results  Component Value Date   TOTALPROTELP 7.0 12/31/2011   ALBUMINELP 56.4 12/31/2011   A1GS 4.3 12/31/2011   A2GS 10.4 12/31/2011   BETS 6.4 12/31/2011   BETA2SER 4.6 12/31/2011   GAMS 17.9 12/31/2011   MSPIKE NOT DETECTED 12/31/2011   SPEI (NOTE) 12/31/2011    Lab Results  Component Value Date   WBC 7.8 12/17/2020   NEUTROABS 4.4 12/17/2020   HGB 10.9 (L) 12/17/2020   HCT 35.1 (L) 12/17/2020   MCV 73.9 (L) 12/17/2020   PLT 350 12/17/2020    No results found for: LABCA2  No components found for: [id number]  No results for input(s): INR in the last 168 hours.  No results found for: LABCA2  No results found for: CAN199  No results found for: WUJ811  No results found for: BJY782  No results found for: CA2729  No components found for: HGQUANT  No results found for: CEA1 / No results found for: CEA1   No results found for: AFPTUMOR  No results found for: [facility]  Lab Results  Component Value Date   KAPLAMBRATIO 10.08 12/31/2011   (kappa/lambda light chains)  No results found for: HGBA, HGBA2QUANT, HGBFQUANT, HGBSQUAN (Hemoglobinopathy evaluation)   Lab Results  Component Value Date   LDH 194 12/31/2011    Lab Results  Component Value Date   IRON  95 12/31/2011   TIBC 325 12/31/2011   IRONPCTSAT 29 12/31/2011   (Iron and TIBC)  Lab Results  Component Value Date   FERRITIN 37 12/31/2011    Urinalysis    Component Value Date/Time   COLORURINE YELLOW 09/07/2019 1341   APPEARANCEUR HAZY (A) 09/07/2019 1341   LABSPEC 1.021 09/07/2019 1341   PHURINE 9.0 (H) 09/07/2019 1341   GLUCOSEU NEGATIVE 09/07/2019 1341   HGBUR NEGATIVE 09/07/2019 1341   BILIRUBINUR NEGATIVE 09/07/2019 1341   KETONESUR NEGATIVE 09/07/2019 1341   PROTEINUR 30 (A) 09/07/2019 1341   UROBILINOGEN 0.2 11/01/2013 1246   NITRITE NEGATIVE 09/07/2019 1341   LEUKOCYTESUR NEGATIVE 09/07/2019 1341     STUDIES: US BREAST LTD UNI LEFT INC AXILLA  Result Date: 11/18/2020 CLINICAL DATA:  50 year old with a personal history of excisional biopsy of a fibroadenoma from the [place] of the RIGHT breast in 2011, presenting with a new palpable lump near the site of the prior surgery in the RIGHT breast. Annual evaluation, LEFT breast. EXAM: DIGITAL DIAGNOSTIC BILATERAL MAMMOGRAM WITH TOMOSYNTHESIS AND CAD; ULTRASOUND RIGHT BREAST LIMITED; ULTRASOUND LEFT BREAST LIMITED TECHNIQUE: Bilateral digital diagnostic mammography and breast tomosynthesis was performed. The images were evaluated with computer-aided detection.; Targeted ultrasound examination of the right breast was performed; Targeted ultrasound examination of the left breast was performed COMPARISON:  Previous exam(s). The patient's most recent breast imaging was in March, 2011. ACR Breast Density Category b: There are scattered areas of fibroglandular density. FINDINGS: CC and MLO views of both breasts and a spot compression tangential view of the palpable concern in the RIGHT breast were obtained. RIGHT: Corresponding to the palpable concern in the LOWER OUTER QUADRANT is a mass with irregular and microlobular margins measuring on the order of 2 cm in size, associated with architectural distortion and scattered  calcifications. In the LOWER OUTER QUADRANT at middle depth is an approximate 1 cm isodense mass without associated architectural distortion or suspicious calcifications, new since the 2011 mammogram. There may be a satellite nodule just medial to this mass. Targeted ultrasound is performed, demonstrating an irregular hypoechoic mass with a hyperechoic halo at the 10 o'clock  position approximately 6 cm from nipple measuring approximately 3.2 x 1.7 x 2 5 cm, demonstrating posterior acoustic shadowing and demonstrating internal power Doppler flow, corresponding to the palpable concern. At the 9:30 o'clock position approximately 8 cm from the nipple at middle depth is an oval parallel hypoechoic mass with predominantly circumscribed margins, though the lateral margin is somewhat vague, measuring approximately 1.1 x 0.5 x 1.1 cm, demonstrating no posterior characteristics and no internal power Doppler flow, corresponding to the mammographic mass. A satellite nodule is not identified adjacent to this mass. Sonographic evaluation of the RIGHT axilla demonstrates a solitary lymph node with eccentric cortical thickening up to 7 mm. LEFT: In the Sac at posterior depth is a mass with irregular margins on the order of 1 cm in size, associated with architectural distortion. In the INNER breast at anterior to middle depth is a circumscribed isodense mass measuring approximately 0.4 cm without associated architectural distortion or suspicious calcifications. Targeted ultrasound is performed, demonstrating an irregular hypoechoic mass with a hyperechoic halo at the 2 o'clock position approximately 10 cm from the nipple measuring approximately 0.8 x 0.7 x 0.5 cm, demonstrating posterior acoustic shadowing and demonstrating peripheral power Doppler flow, corresponding to the mammographic mass. At the 10 o'clock position approximately 6 cm from the nipple are benign clustered cysts measuring approximately 0.3 x 0.3 x  0.2 cm, demonstrating posterior acoustic enhancement and no internal power Doppler flow, corresponding to the mammographic mass. Sonographic evaluation of the LEFT axilla demonstrates no pathologic lymphadenopathy. IMPRESSION: 1. Highly suspicious 3.2 cm mass associated with architectural distortion and scattered calcifications in the UPPER OUTER QUADRANT of the RIGHT breast at the 10 o'clock position 6 cm from the nipple, corresponding to the palpable concern. 2. Indeterminate new 1.1 cm mass in the UPPER OUTER QUADRANT of the RIGHT breast at the 9:30 o'clock position 8 cm from the nipple. 3. Solitary pathologic RIGHT axillary lymph node with eccentric cortical thickening up to 7 mm. 4. Highly suspicious 0.8 cm mass involving the UPPER OUTER QUADRANT of the LEFT breast. 5. No pathologic LEFT axillary lymphadenopathy. RECOMMENDATION: 1. Ultrasound-guided core needle biopsy of the highly suspicious 3.2 cm RIGHT breast mass, the indeterminate 1.1 cm RIGHT breast mass, and the solitary pathologic RIGHT axillary lymph node. 2. Ultrasound-guided core needle biopsy of the highly suspicious LEFT breast mass. The ultrasound core needle biopsy procedure was discussed with patient and her questions were answered. She wishes to proceed with the biopsies which have been scheduled by the [place] staff. I have discussed the findings and recommendations with the patient. BI-RADS CATEGORY  5: Highly suggestive of malignancy. Electronically Signed   By: Evangeline Dakin M.D.   On: 11/18/2020 10:13   US BREAST LTD UNI RIGHT INC AXILLA  Result Date: 11/18/2020 CLINICAL DATA:  50 year old with a personal history of excisional biopsy of a fibroadenoma from the [place] of the RIGHT breast in 2011, presenting with a new palpable lump near the site of the prior surgery in the RIGHT breast. Annual evaluation, LEFT breast. EXAM: DIGITAL DIAGNOSTIC BILATERAL MAMMOGRAM WITH TOMOSYNTHESIS AND CAD; ULTRASOUND RIGHT BREAST  LIMITED; ULTRASOUND LEFT BREAST LIMITED TECHNIQUE: Bilateral digital diagnostic mammography and breast tomosynthesis was performed. The images were evaluated with computer-aided detection.; Targeted ultrasound examination of the right breast was performed; Targeted ultrasound examination of the left breast was performed COMPARISON:  Previous exam(s). The patient's most recent breast imaging was in March, 2011. ACR Breast Density Category b: There are scattered areas  of fibroglandular density. FINDINGS: CC and MLO views of both breasts and a spot compression tangential view of the palpable concern in the RIGHT breast were obtained. RIGHT: Corresponding to the palpable concern in the LOWER OUTER QUADRANT is a mass with irregular and microlobular margins measuring on the order of 2 cm in size, associated with architectural distortion and scattered calcifications. In the LOWER OUTER QUADRANT at middle depth is an approximate 1 cm isodense mass without associated architectural distortion or suspicious calcifications, new since the 2011 mammogram. There may be a satellite nodule just medial to this mass. Targeted ultrasound is performed, demonstrating an irregular hypoechoic mass with a hyperechoic halo at the 10 o'clock position approximately 6 cm from nipple measuring approximately 3.2 x 1.7 x 2 5 cm, demonstrating posterior acoustic shadowing and demonstrating internal power Doppler flow, corresponding to the palpable concern. At the 9:30 o'clock position approximately 8 cm from the nipple at middle depth is an oval parallel hypoechoic mass with predominantly circumscribed margins, though the lateral margin is somewhat vague, measuring approximately 1.1 x 0.5 x 1.1 cm, demonstrating no posterior characteristics and no internal power Doppler flow, corresponding to the mammographic mass. A satellite nodule is not identified adjacent to this mass. Sonographic evaluation of the RIGHT axilla demonstrates a solitary lymph  node with eccentric cortical thickening up to 7 mm. LEFT: In the [place] at posterior depth is a mass with irregular margins on the order of 1 cm in size, associated with architectural distortion. In the INNER breast at anterior to middle depth is a circumscribed isodense mass measuring approximately 0.4 cm without associated architectural distortion or suspicious calcifications. Targeted ultrasound is performed, demonstrating an irregular hypoechoic mass with a hyperechoic halo at the 2 o'clock position approximately 10 cm from the nipple measuring approximately 0.8 x 0.7 x 0.5 cm, demonstrating posterior acoustic shadowing and demonstrating peripheral power Doppler flow, corresponding to the mammographic mass. At the 10 o'clock position approximately 6 cm from the nipple are benign clustered cysts measuring approximately 0.3 x 0.3 x 0.2 cm, demonstrating posterior acoustic enhancement and no internal power Doppler flow, corresponding to the mammographic mass. Sonographic evaluation of the LEFT axilla demonstrates no pathologic lymphadenopathy. IMPRESSION: 1. Highly suspicious 3.2 cm mass associated with architectural distortion and scattered calcifications in the UPPER OUTER QUADRANT of the RIGHT breast at the 10 o'clock position 6 cm from the nipple, corresponding to the palpable concern. 2. Indeterminate new 1.1 cm mass in the UPPER OUTER QUADRANT of the RIGHT breast at the 9:30 o'clock position 8 cm from the nipple. 3. Solitary pathologic RIGHT axillary lymph node with eccentric cortical thickening up to 7 mm. 4. Highly suspicious 0.8 cm mass involving the UPPER OUTER QUADRANT of the LEFT breast. 5. No pathologic LEFT axillary lymphadenopathy. RECOMMENDATION: 1. Ultrasound-guided core needle biopsy of the highly suspicious 3.2 cm RIGHT breast mass, the indeterminate 1.1 cm RIGHT breast mass, and the solitary pathologic RIGHT axillary lymph node. 2. Ultrasound-guided core needle biopsy of the highly  suspicious LEFT breast mass. The ultrasound core needle biopsy procedure was discussed with patient and her questions were answered. She wishes to proceed with the biopsies which have been scheduled by the [place] staff. I have discussed the findings and recommendations with the patient. BI-RADS CATEGORY  5: Highly suggestive of malignancy. Electronically Signed   By: Evangeline Dakin M.D.   On: 11/18/2020 10:13   MM DIAG BREAST TOMO BILATERAL  Result Date: 11/18/2020 CLINICAL DATA:  50 year old with a  personal history of excisional biopsy of a fibroadenoma from the [place] of the RIGHT breast in 2011, presenting with a new palpable lump near the site of the prior surgery in the RIGHT breast. Annual evaluation, LEFT breast. EXAM: DIGITAL DIAGNOSTIC BILATERAL MAMMOGRAM WITH TOMOSYNTHESIS AND CAD; ULTRASOUND RIGHT BREAST LIMITED; ULTRASOUND LEFT BREAST LIMITED TECHNIQUE: Bilateral digital diagnostic mammography and breast tomosynthesis was performed. The images were evaluated with computer-aided detection.; Targeted ultrasound examination of the right breast was performed; Targeted ultrasound examination of the left breast was performed COMPARISON:  Previous exam(s). The patient's most recent breast imaging was in March, 2011. ACR Breast Density Category b: There are scattered areas of fibroglandular density. FINDINGS: CC and MLO views of both breasts and a spot compression tangential view of the palpable concern in the RIGHT breast were obtained. RIGHT: Corresponding to the palpable concern in the LOWER OUTER QUADRANT is a mass with irregular and microlobular margins measuring on the order of 2 cm in size, associated with architectural distortion and scattered calcifications. In the LOWER OUTER QUADRANT at middle depth is an approximate 1 cm isodense mass without associated architectural distortion or suspicious calcifications, new since the 2011 mammogram. There may be a satellite nodule just  medial to this mass. Targeted ultrasound is performed, demonstrating an irregular hypoechoic mass with a hyperechoic halo at the 10 o'clock position approximately 6 cm from nipple measuring approximately 3.2 x 1.7 x 2 5 cm, demonstrating posterior acoustic shadowing and demonstrating internal power Doppler flow, corresponding to the palpable concern. At the 9:30 o'clock position approximately 8 cm from the nipple at middle depth is an oval parallel hypoechoic mass with predominantly circumscribed margins, though the lateral margin is somewhat vague, measuring approximately 1.1 x 0.5 x 1.1 cm, demonstrating no posterior characteristics and no internal power Doppler flow, corresponding to the mammographic mass. A satellite nodule is not identified adjacent to this mass. Sonographic evaluation of the RIGHT axilla demonstrates a solitary lymph node with eccentric cortical thickening up to 7 mm. LEFT: In the [place] at posterior depth is a mass with irregular margins on the order of 1 cm in size, associated with architectural distortion. In the INNER breast at anterior to middle depth is a circumscribed isodense mass measuring approximately 0.4 cm without associated architectural distortion or suspicious calcifications. Targeted ultrasound is performed, demonstrating an irregular hypoechoic mass with a hyperechoic halo at the 2 o'clock position approximately 10 cm from the nipple measuring approximately 0.8 x 0.7 x 0.5 cm, demonstrating posterior acoustic shadowing and demonstrating peripheral power Doppler flow, corresponding to the mammographic mass. At the 10 o'clock position approximately 6 cm from the nipple are benign clustered cysts measuring approximately 0.3 x 0.3 x 0.2 cm, demonstrating posterior acoustic enhancement and no internal power Doppler flow, corresponding to the mammographic mass. Sonographic evaluation of the LEFT axilla demonstrates no pathologic lymphadenopathy. IMPRESSION: 1. Highly  suspicious 3.2 cm mass associated with architectural distortion and scattered calcifications in the UPPER OUTER QUADRANT of the RIGHT breast at the 10 o'clock position 6 cm from the nipple, corresponding to the palpable concern. 2. Indeterminate new 1.1 cm mass in the UPPER OUTER QUADRANT of the RIGHT breast at the 9:30 o'clock position 8 cm from the nipple. 3. Solitary pathologic RIGHT axillary lymph node with eccentric cortical thickening up to 7 mm. 4. Highly suspicious 0.8 cm mass involving the UPPER OUTER QUADRANT of the LEFT breast. 5. No pathologic LEFT axillary lymphadenopathy. RECOMMENDATION: 1. Ultrasound-guided core needle biopsy of  the highly suspicious 3.2 cm RIGHT breast mass, the indeterminate 1.1 cm RIGHT breast mass, and the solitary pathologic RIGHT axillary lymph node. 2. Ultrasound-guided core needle biopsy of the highly suspicious LEFT breast mass. The ultrasound core needle biopsy procedure was discussed with patient and her questions were answered. She wishes to proceed with the biopsies which have been scheduled by the [facility] staff. I have discussed the findings and recommendations with the patient. BI-RADS CATEGORY  5: Highly suggestive of malignancy. Electronically Signed   By: Evangeline Dakin M.D.   On: 11/18/2020 10:13   [place] AXILLARY NODE CORE BIOPSY RIGHT  Addendum Date: 12/04/2020   ADDENDUM REPORT: 12/04/2020 14:30 ADDENDUM: Pathology revealed FIBROADENOMA of the RIGHT breast, 9:30 o'clock, (heart clip). This was found to be concordant by Dr. Dorise Bullion. 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 This was found to be concordant by Dr. Dorise Bullion. 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 This was found to be concordant by Dr. Dorise Bullion. 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 This was found to be concordant by Dr. Dorise Bullion. Pathology results were discussed with the patient by telephone. The patient reported doing well after the biopsies with tenderness and oozing at the sites. Post biopsy instructions and care were reviewed and questions were answered. The patient was encouraged to call [facility] for any additional concerns. My direct phone number was provided. The [facility] on December 17, 2020. The patient was offered an appointment for December 10, 2020, she will be out of the country until December 16, 2020. Pathology results reported by Terie Purser, RN on 12/04/2020. Electronically Signed   By: Dorise Bullion III M.D   On: 12/04/2020 14:30   Result Date: 12/04/2020 CLINICAL DATA:  Biopsy of a right breast mass at 9:30, a right breast mass at 10 o'clock, a right axillary lymph node, and a left breast mass at 2 o'clock. EXAM: ULTRASOUND GUIDED BILATERAL BREAST CORE NEEDLE BIOPSY COMPARISON:  Previous exam(s). PROCEDURE: I met with the patient and we discussed the procedure of ultrasound-guided biopsy, including benefits and alternatives. We discussed the high likelihood of a successful procedure. We discussed the risks of the procedure, including infection, bleeding, tissue injury, clip migration, and inadequate sampling. Informed written consent was given. The usual time-out protocol was performed immediately prior to the procedure. Lesion quadrant: Right breast 930 Using sterile technique and 1% Lidocaine as local anesthetic, under direct ultrasound visualization, a 12 gauge spring-loaded device was used to perform biopsy of a 930 right breast mass using a lateral approach. At the conclusion of the procedure a heart shaped tissue marker clip was deployed into the biopsy cavity. Follow  up 2 view mammogram was performed and dictated separately. Lesion quadrant: Right breast, 10 o'clock Using sterile technique and 1% Lidocaine as local anesthetic, under direct ultrasound visualization, a 12 gauge spring-loaded device was used to perform biopsy of a 10 o'clock right breast mass using a lateral approach. At the conclusion of the procedure a coil shaped tissue marker clip was deployed into the biopsy cavity. Follow up 2 view mammogram was performed and dictated separately. Lesion quadrant: Right axilla Using sterile technique and 1%  Lidocaine as local anesthetic, under direct ultrasound visualization, a 14 gauge spring-loaded device was used to perform biopsy of a right axillary lymph node using a lateral approach. At the conclusion of the procedure a tri bell tissue marker clip was deployed into the biopsy cavity. Follow up 2 view mammogram was performed and dictated separately. Lesion quadrant: Left breast, 2 o'clock Using sterile technique and 1% Lidocaine as local anesthetic, under direct ultrasound visualization, a 12 gauge spring-loaded device was used to perform biopsy of a 2 o'clock left breast mass using a lateral approach. At the conclusion of the procedure a coil shaped tissue marker clip was deployed into the biopsy cavity. Follow up 2 view mammogram was performed and dictated separately. IMPRESSION: Ultrasound guided biopsy of 2 right breast masses, a right axillary lymph node, and a left breast mass. No apparent complications. Electronically Signed: By: Dorise Bullion III M.D On: 12/03/2020 15:46  MM CLIP PLACEMENT LEFT  Result Date: 12/03/2020 CLINICAL DATA:  Evaluate biopsy markers EXAM: DIAGNOSTIC BILATERAL MAMMOGRAM POST ULTRASOUND BIOPSY COMPARISON:  Previous exam(s). FINDINGS: Mammographic images were obtained following ultrasound guided biopsy of a 930 right breast mass, a 10 o'clock right breast mass, a right axillary lymph node, and a 2 o'clock left breast mass. On the  right, the heart shaped clip is within the biopsied 930 right breast mass. The coil shaped clip is within the 10 o'clock right breast mass. A tri bell clip is within the biopsied right axillary node. A coil shaped clip is within the 2 o'clock left breast mass. IMPRESSION: Appropriate positioning of all biopsy clips as above. On the right, the heart shaped clip is within the biopsied 930 right breast mass. The coil shaped clip is within the 10 o'clock right breast mass. A tri bell clip is within the biopsied right axillary node. A coil shaped clip is within the left 2 o'clock breast mass. Final Assessment: Post Procedure Mammograms for Marker Placement Electronically Signed   By: Dorise Bullion III M.D   On: 12/03/2020 15:58  MM CLIP PLACEMENT RIGHT  Result Date: 12/03/2020 CLINICAL DATA:  Evaluate biopsy markers EXAM: DIAGNOSTIC BILATERAL MAMMOGRAM POST ULTRASOUND BIOPSY COMPARISON:  Previous exam(s). FINDINGS: Mammographic images were obtained following ultrasound guided biopsy of a 930 right breast mass, a 10 o'clock right breast mass, a right axillary lymph node, and a 2 o'clock left breast mass. On the right, the heart shaped clip is within the biopsied 930 right breast mass. The coil shaped clip is within the 10 o'clock right breast mass. A tri bell clip is within the biopsied right axillary node. A coil shaped clip is within the 2 o'clock left breast mass. IMPRESSION: Appropriate positioning of all biopsy clips as above. On the right, the heart shaped clip is within the biopsied 930 right breast mass. The coil shaped clip is within the 10 o'clock right breast mass. A tri bell clip is within the biopsied right axillary node. A coil shaped clip is within the left 2 o'clock breast mass. Final Assessment: Post Procedure Mammograms for Marker Placement Electronically Signed   By: Dorise Bullion III M.D   On: 12/03/2020 15:58  [place] LT BREAST BX W LOC DEV 1ST LESION IMG BX SPEC US GUIDE  Addendum Date:  12/04/2020   ADDENDUM REPORT: 12/04/2020 14:30 ADDENDUM: Pathology revealed FIBROADENOMA of the RIGHT breast, 9:30 o'clock, (heart clip). This was found to be concordant by Dr. Dorise Bullion. Pathology revealed GRADE II INVASIVE AND IN SITU MAMMARY CARCINOMA of the  RIGHT breast, 10:00 o'clock, (coil clip). This was found to be concordant by Dr. Dorise Bullion. 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 This was found to be concordant by Dr. Dorise Bullion. 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 This was found to be concordant by Dr. Dorise Bullion. Pathology results were discussed with the patient by telephone. The patient reported doing well after the biopsies with tenderness and oozing at the sites. Post biopsy instructions and care were reviewed and questions were answered. The patient was encouraged to call [facility] for any additional concerns. My direct phone number was provided. The patient was referred to [facility] on December 17, 2020. The patient was offered an appointment for December 10, 2020, she will be out of the country until December 16, 2020. Pathology results reported by Terie Purser, RN on 12/04/2020. Electronically Signed   By: Dorise Bullion III M.D   On: 12/04/2020 14:30   Result Date: 12/04/2020 CLINICAL DATA:  Biopsy of a right breast mass at 9:30, a right breast mass at 10 o'clock, a right axillary lymph node, and a left breast mass at 2 o'clock. EXAM: ULTRASOUND GUIDED BILATERAL BREAST CORE NEEDLE BIOPSY COMPARISON:  Previous exam(s). PROCEDURE: I met with the patient and we discussed the procedure of ultrasound-guided biopsy, including benefits and alternatives. We discussed the high likelihood of a successful  procedure. We discussed the risks of the procedure, including infection, bleeding, tissue injury, clip migration, and inadequate sampling. Informed written consent was given. The usual time-out protocol was performed immediately prior to the procedure. Lesion quadrant: Right breast 930 Using sterile technique and 1% Lidocaine as local anesthetic, under direct ultrasound visualization, a 12 gauge spring-loaded device was used to perform biopsy of a 930 right breast mass using a lateral approach. At the conclusion of the procedure a heart shaped tissue marker clip was deployed into the biopsy cavity. Follow up 2 view mammogram was performed and dictated separately. Lesion quadrant: Right breast, 10 o'clock Using sterile technique and 1% Lidocaine as local anesthetic, under direct ultrasound visualization, a 12 gauge spring-loaded device was used to perform biopsy of a 10 o'clock right breast mass using a lateral approach. At the conclusion of the procedure a coil shaped tissue marker clip was deployed into the biopsy cavity. Follow up 2 view mammogram was performed and dictated separately. Lesion quadrant: Right axilla Using sterile technique and 1% Lidocaine as local anesthetic, under direct ultrasound visualization, a 14 gauge spring-loaded device was used to perform biopsy of a right axillary lymph node using a lateral approach. At the conclusion of the procedure a tri bell tissue marker clip was deployed into the biopsy cavity. Follow up 2 view mammogram was performed and dictated separately. Lesion quadrant: Left breast, 2 o'clock Using sterile technique and 1% Lidocaine as local anesthetic, under direct ultrasound visualization, a 12 gauge spring-loaded device was used to perform biopsy of a 2 o'clock left breast mass using a lateral approach. At the conclusion of the procedure a coil shaped tissue marker clip was deployed into the biopsy cavity. Follow up 2 view mammogram was performed and dictated separately.  IMPRESSION: Ultrasound guided biopsy of 2 right breast masses, a right axillary lymph node, and a left breast mass. No apparent complications. Electronically Signed: By: Shanon Brow  Jimmye Norman III M.D On: 12/03/2020 15:46  [place] RT BREAST BX W LOC DEV 1ST LESION IMG BX SPEC US GUIDE  Addendum Date: 12/04/2020   ADDENDUM REPORT: 12/04/2020 14:30 ADDENDUM: Pathology revealed FIBROADENOMA of the RIGHT breast, 9:30 o'clock, (heart clip). This was found to be concordant by Dr. Dorise Bullion. 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 This was found to be concordant by Dr. Dorise Bullion. 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 This was found to be concordant by Dr. Dorise Bullion. 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 This was found to be concordant by Dr. Dorise Bullion. Pathology results were discussed with the patient by telephone. The patient reported doing well after the biopsies with tenderness and oozing at the sites. Post biopsy instructions and care were reviewed and questions were answered. The patient was encouraged to call [facility] for any additional concerns. My direct phone number was provided. The patient was referred to [facility] at [facility] on December 17, 2020. The patient was offered an appointment for December 10, 2020, she will be out of the country until December 16, 2020. Pathology results reported by Terie Purser, RN on 12/04/2020. Electronically Signed   By: Dorise Bullion III M.D   On: 12/04/2020 14:30   Result Date: 12/04/2020 CLINICAL DATA:  Biopsy of a right breast mass at 9:30, a right breast mass at 10 o'clock, a right axillary lymph node, and a left  breast mass at 2 o'clock. EXAM: ULTRASOUND GUIDED BILATERAL BREAST CORE NEEDLE BIOPSY COMPARISON:  Previous exam(s). PROCEDURE: I met with the patient and we discussed the procedure of ultrasound-guided biopsy, including benefits and alternatives. We discussed the high likelihood of a successful procedure. We discussed the risks of the procedure, including infection, bleeding, tissue injury, clip migration, and inadequate sampling. Informed written consent was given. The usual time-out protocol was performed immediately prior to the procedure. Lesion quadrant: Right breast 930 Using sterile technique and 1% Lidocaine as local anesthetic, under direct ultrasound visualization, a 12 gauge spring-loaded device was used to perform biopsy of a 930 right breast mass using a lateral approach. At the conclusion of the procedure a heart shaped tissue marker clip was deployed into the biopsy cavity. Follow up 2 view mammogram was performed and dictated separately. Lesion quadrant: Right breast, 10 o'clock Using sterile technique and 1% Lidocaine as local anesthetic, under direct ultrasound visualization, a 12 gauge spring-loaded device was used to perform biopsy of a 10 o'clock right breast mass using a lateral approach. At the conclusion of the procedure a coil shaped tissue marker clip was deployed into the biopsy cavity. Follow up 2 view mammogram was performed and dictated separately. Lesion quadrant: Right axilla Using sterile technique and 1% Lidocaine as local anesthetic, under direct ultrasound visualization, a 14 gauge spring-loaded device was used to perform biopsy of a right axillary lymph node using a lateral approach. At the conclusion of the procedure a tri bell tissue marker clip was deployed into the biopsy cavity. Follow up 2 view mammogram was performed and dictated separately. Lesion quadrant: Left breast, 2 o'clock Using sterile technique and 1% Lidocaine as local anesthetic, under direct ultrasound  visualization, a 12 gauge spring-loaded device was used to perform biopsy  of a 2 o'clock left breast mass using a lateral approach. At the conclusion of the procedure a coil shaped tissue marker clip was deployed into the biopsy cavity. Follow up 2 view mammogram was performed and dictated separately. IMPRESSION: Ultrasound guided biopsy of 2 right breast masses, a right axillary lymph node, and a left breast mass. No apparent complications. Electronically Signed: By: Dorise Bullion III M.D On: 12/03/2020 15:46  [place] RT BREAST BX W LOC DEV EA ADD LESION IMG BX SPEC US GUIDE  Addendum Date: 12/04/2020   ADDENDUM REPORT: 12/04/2020 14:30 ADDENDUM: Pathology revealed FIBROADENOMA of the RIGHT breast, 9:30 o'clock, (heart clip). This was found to be concordant by Dr. Dorise Bullion. 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 This was found to be concordant by Dr. Dorise Bullion. 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 This was found to be concordant by Dr. Dorise Bullion. 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 This was found to be concordant by Dr. Dorise Bullion. Pathology results were discussed with the patient by telephone. The patient reported doing well after the biopsies with tenderness and oozing at the sites. Post biopsy instructions and care were reviewed and questions were answered. The patient was encouraged to call [facility] for any additional concerns. My direct phone number was provided. The patient was referred to [facility] on December 17, 2020. The patient was offered an appointment for December 10, 2020, she will be out of the country until December 16, 2020. Pathology results reported by Terie Purser, RN on 12/04/2020. Electronically Signed   By: Dorise Bullion III M.D   On: 12/04/2020 14:30   Result Date: 12/04/2020 CLINICAL DATA:  Biopsy of a right breast mass at 9:30, a right breast mass at 10 o'clock, a right axillary lymph node, and a left breast mass at 2 o'clock. EXAM: ULTRASOUND GUIDED BILATERAL BREAST CORE NEEDLE BIOPSY COMPARISON:  Previous exam(s). PROCEDURE: I met with the patient and we discussed the procedure of ultrasound-guided biopsy, including benefits and alternatives. We discussed the high likelihood of a successful procedure. We discussed the risks of the procedure, including infection, bleeding, tissue injury, clip migration, and inadequate sampling. Informed written consent was given. The usual time-out protocol was performed immediately prior to the procedure. Lesion quadrant: Right breast 930 Using sterile technique and 1% Lidocaine as local anesthetic, under direct ultrasound visualization, a 12 gauge spring-loaded device was used to perform biopsy of a 930 right breast mass using a lateral approach. At the conclusion of the procedure a heart shaped tissue marker clip was deployed into the biopsy cavity. Follow up 2 view mammogram was performed and dictated separately. Lesion quadrant: Right breast, 10 o'clock Using sterile technique and 1% Lidocaine as local anesthetic, under direct ultrasound visualization, a 12 gauge spring-loaded device was used to perform biopsy of a 10 o'clock right breast mass using a lateral approach. At the conclusion of the procedure a coil shaped tissue marker clip was deployed into the biopsy cavity. Follow up 2 view mammogram was performed and dictated separately. Lesion quadrant: Right axilla Using sterile technique and 1% Lidocaine as local anesthetic, under direct ultrasound visualization, a 14 gauge spring-loaded device was used to perform biopsy of  a right axillary lymph node using a lateral  approach. At the conclusion of the procedure a tri bell tissue marker clip was deployed into the biopsy cavity. Follow up 2 view mammogram was performed and dictated separately. Lesion quadrant: Left breast, 2 o'clock Using sterile technique and 1% Lidocaine as local anesthetic, under direct ultrasound visualization, a 12 gauge spring-loaded device was used to perform biopsy of a 2 o'clock left breast mass using a lateral approach. At the conclusion of the procedure a coil shaped tissue marker clip was deployed into the biopsy cavity. Follow up 2 view mammogram was performed and dictated separately. IMPRESSION: Ultrasound guided biopsy of 2 right breast masses, a right axillary lymph node, and a left breast mass. No apparent complications. Electronically Signed: By: Dorise Bullion III M.D On: 12/03/2020 15:46    ELIGIBLE FOR AVAILABLE RESEARCH PROTOCOL: no  ASSESSMENT: 50 y.o. [place] woman status post bilateral breast biopsies 12/03/2020, showing  (1) on the right, a clinical T2 N1, stage IIa invasive lobular carcinoma, grade 2, estrogen and progesterone receptor strongly positive, HER2 not amplified, with an MIB-1 of 40%   (a) the biopsied right axillary lymph node was positive with extracapsular extension   (b) a second right breast mass also biopsied was a fibroadenoma, concordant   (2) on the left, a clinical T1b N0, stage IA invasive ductal carcinoma, grade 1 or 2, estrogen and progesterone receptor positive, HER2 not amplified, with an MIB 1 of 15%  (3) genetics testing  (4) MammaPrint obtained from the right-sided tumor  (5) definitive surgery pending  (6) adjuvant radiation on the right, possibly also on the left  (7) antiestrogens  PLAN: I met today with Mercede to review her new diagnosis. Specifically we discussed the biology of her breast cancer, its diagnosis, staging, treatment  options and prognosis.  She understands that she has 2 different breast cancers.  The one on the  left is smaller and less aggressive.  Treatment of the right-sided breast cancer will automatically cover treatment of the left breast cancer so we concentrated on the right-sided breast cancer today.  We first reviewed the fact that cancer is not one disease but more than 100 different diseases and that it is important to keep them separate-- otherwise when friends and relatives discuss their own cancer experiences with [place] confusion can result. Similarly we explained that if breast cancer spreads to the bone or liver, the patient would not have bone cancer or liver cancer, but breast cancer in the bone and breast cancer in the liver: one cancer in three places-- not 3 different cancers which otherwise would have to be treated in 3 different ways.  We discussed the difference between local and systemic therapy. In terms of loco-regional treatment, lumpectomy plus radiation is equivalent to mastectomy as far as survival is concerned. For this reason, and because the cosmetic results are generally superior, we recommend breast conserving surgery.   We then discussed the rationale for systemic therapy. There is some risk that this cancer may have already spread to other parts of her body. Patients frequently ask at this point about bone scans, CAT scans and PET scans to find out if they have occult breast cancer somewhere else. The problem is that in early stage disease we are much more likely to find false positives then true cancers and this would expose the patient to unnecessary procedures as well as unnecessary radiation. Scans cannot answer the question the patient really would like to know, which  is whether she has microscopic disease elsewhere in her body. For those reasons we do not recommend them.  Of course we would proceed to aggressive evaluation of any symptoms that might suggest metastatic disease, but that is not the case here.  Next we went over the options for systemic therapy which are  anti-estrogens, anti-HER-2 immunotherapy, and chemotherapy. 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 She is a good candidate for anti-estrogens.  The question of chemotherapy is more complicated. Chemotherapy is most effective in rapidly growing, aggressive tumors. It is much less effective in lower-grade cancers. For that reason we are going to request a MammaPrint test from the right breast biopsy to help [place] make a definitive decision regarding chemotherapy in this case.  Trenia also qualifies for genetics testing. In patients who carry a deleterious mutation [for example in a  BRCA gene], the risk of a new breast cancer developing in the future may be sufficiently great that the patient may choose bilateral mastectomies. However if she wishes to keep her breasts in that situation it is safe to do so. That would require intensified screening, which generally means not only yearly mammography but a yearly breast MRI as well. Of course, if there is a deleterious mutation bilateral oophorectomy would be necessary as there is no standard screening protocol for ovarian cancer.'  The plan then is to start with MammaPrint testing.  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  She will then proceed to definitive surgery plus or minus port placement.  Chemotherapy if indicated would follow, then radiation, then antiestrogens.  Jeslin has a good understanding of the overall plan. She agrees with it. She knows the goal of treatment in her case is cure. She will call with any problems that may develop before her next visit here.  Total encounter time 65 minutes.Sarajane Jews C. Paton Crum, MD 12/17/2020 11:28 AM Medical Oncology and Hematology [facility] [place], [place] [address] Tel. [phone]    Fax. [phone]   This document serves as a record of services personally performed by Lurline Del,  MD. It was created on his behalf by Wilburn Mylar, a trained medical scribe. The creation of this record is based on the scribe's personal observations and the provider's statements to them.   I, Lurline Del MD, have reviewed the above documentation for accuracy and completeness, and I agree with the above.    *Total Encounter Time as defined by the Centers for Medicare and Medicaid Services includes, in addition to the face-to-face time of a patient visit (documented in the note above) non-face-to-face time: obtaining and reviewing outside history, ordering and reviewing medications, tests or procedures, care coordination (communications with other health care professionals or caregivers) and documentation in the medical record.

## 2020-12-17 ENCOUNTER — Encounter: Payer: Self-pay | Admitting: *Deleted

## 2020-12-17 ENCOUNTER — Ambulatory Visit: Payer: Medicaid Other | Attending: Surgery | Admitting: Physical Therapy

## 2020-12-17 ENCOUNTER — Inpatient Hospital Stay: Payer: Medicaid Other | Attending: Oncology | Admitting: Oncology

## 2020-12-17 ENCOUNTER — Encounter: Payer: Self-pay | Admitting: Genetic Counselor

## 2020-12-17 ENCOUNTER — Inpatient Hospital Stay: Payer: Medicaid Other

## 2020-12-17 ENCOUNTER — Encounter: Payer: Self-pay | Admitting: Licensed Clinical Social Worker

## 2020-12-17 ENCOUNTER — Other Ambulatory Visit: Payer: Self-pay

## 2020-12-17 ENCOUNTER — Ambulatory Visit: Payer: No Typology Code available for payment source | Admitting: Genetic Counselor

## 2020-12-17 ENCOUNTER — Encounter: Payer: Self-pay | Admitting: Physical Therapy

## 2020-12-17 ENCOUNTER — Telehealth: Payer: Self-pay | Admitting: *Deleted

## 2020-12-17 ENCOUNTER — Ambulatory Visit
Admission: RE | Admit: 2020-12-17 | Discharge: 2020-12-17 | Disposition: A | Discharge status: Home / Self Care | Payer: Medicaid Other | Source: Ambulatory Visit | Attending: Radiation Oncology | Admitting: Radiation Oncology

## 2020-12-17 VITALS — BP 146/81 | HR 67 | Temp 97.7°F | Resp 18 | Ht 71.0 in | Wt 227.2 lb

## 2020-12-17 DIAGNOSIS — Z170 Estrogen receptor positive status [ER+]: Secondary | ICD-10-CM

## 2020-12-17 DIAGNOSIS — C50412 Malignant neoplasm of upper-outer quadrant of left female breast: Secondary | ICD-10-CM | POA: Insufficient documentation

## 2020-12-17 DIAGNOSIS — C50411 Malignant neoplasm of upper-outer quadrant of right female breast: Secondary | ICD-10-CM | POA: Insufficient documentation

## 2020-12-17 DIAGNOSIS — Z8049 Family history of malignant neoplasm of other genital organs: Secondary | ICD-10-CM

## 2020-12-17 DIAGNOSIS — R293 Abnormal posture: Secondary | ICD-10-CM | POA: Diagnosis present

## 2020-12-17 LAB — CBC WITH DIFFERENTIAL (CANCER CENTER ONLY)
Abs Immature Granulocytes: 0.05 10*3/uL (ref 0.00–0.07)
Basophils Absolute: 0 10*3/uL (ref 0.0–0.1)
Basophils Relative: 1 %
Eosinophils Absolute: 0.3 10*3/uL (ref 0.0–0.5)
Eosinophils Relative: 4 %
HCT: 35.1 % — ABNORMAL LOW (ref 36.0–46.0)
Hemoglobin: 10.9 g/dL — ABNORMAL LOW (ref 12.0–15.0)
Immature Granulocytes: 1 %
Lymphocytes Relative: 34 %
Lymphs Abs: 2.7 10*3/uL (ref 0.7–4.0)
MCH: 22.9 pg — ABNORMAL LOW (ref 26.0–34.0)
MCHC: 31.1 g/dL (ref 30.0–36.0)
MCV: 73.9 fL — ABNORMAL LOW (ref 80.0–100.0)
Monocytes Absolute: 0.4 10*3/uL (ref 0.1–1.0)
Monocytes Relative: 5 %
Neutro Abs: 4.4 10*3/uL (ref 1.7–7.7)
Neutrophils Relative %: 55 %
Platelet Count: 350 10*3/uL (ref 150–400)
RBC: 4.75 MIL/uL (ref 3.87–5.11)
RDW: 16.9 % — ABNORMAL HIGH (ref 11.5–15.5)
WBC Count: 7.8 10*3/uL (ref 4.0–10.5)
nRBC: 0 % (ref 0.0–0.2)

## 2020-12-17 LAB — CMP (CANCER CENTER ONLY)
ALT: 18 U/L (ref 0–44)
AST: 15 U/L (ref 15–41)
Albumin: 3.4 g/dL — ABNORMAL LOW (ref 3.5–5.0)
Alkaline Phosphatase: 125 U/L (ref 38–126)
Anion gap: 10 (ref 5–15)
BUN: 12 mg/dL (ref 6–20)
CO2: 24 mmol/L (ref 22–32)
Calcium: 9.2 mg/dL (ref 8.9–10.3)
Chloride: 106 mmol/L (ref 98–111)
Creatinine: 0.91 mg/dL (ref 0.44–1.00)
GFR, Estimated: >60 mL/min (ref >60)
Glucose, Bld: 118 mg/dL — ABNORMAL HIGH (ref 70–99)
Potassium: 3.4 mmol/L — ABNORMAL LOW (ref 3.5–5.1)
Sodium: 140 mmol/L (ref 135–145)
Total Bilirubin: 0.3 mg/dL (ref 0.3–1.2)
Total Protein: 7.4 g/dL (ref 6.5–8.1)

## 2020-12-17 LAB — GENETIC SCREENING ORDER

## 2020-12-17 NOTE — Telephone Encounter (Signed)
Ordered mammaprint (core)per Dr. Jana Hakim. Faxed requisition to [facility]

## 2020-12-17 NOTE — Progress Notes (Signed)
Chelsea Hansen  Initial Assessment   Chelsea Hansen is a 50 y.o. year old female accompanied by daughter, Chelsea Hansen. Clinical Social Hansen was referred by [facility] for assessment of psychosocial needs.   SDOH (Social Determinants of Health) assessments performed: Yes SDOH Interventions    Flowsheet Row Most Recent Value  SDOH Interventions   Food Insecurity Interventions Other (Comment)  Financial Strain Interventions Other (Comment)  [Breast cancer foundations, local resources]  Housing Interventions Other (Comment)       Distress Screen completed: Yes ONCBCN DISTRESS SCREENING 12/17/2020  Screening Type Initial Screening  Distress experienced in past week (1-10) 7  Practical problem type Housing;Transportation;Food  Physical Problem type Pain      Family/Social Information:  Housing Arrangement: patient lives with her three sons Chelsea Hansen, Chelsea Hansen Reasons) . Two daughters Chelsea Hansen, [place]) live in [place] Family members/support persons in your life? 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 Income source: Employment Financial concerns: Yes, current concerns Type of concern: Utilities, Transportation, and Food Food access concerns: due to Chelsea Hansen Currently in place:  [facility] Medicaid, anxiety medication prescribed by PCP, has a counseling agency she wants to connect with  Coping/ Adjustment to diagnosis: Patient understands treatment plan and what happens next? yes Concerns about diagnosis and/or treatment: Overwhelmed by information and How I will pay for the services I need Patient reported stressors: Housing, Publishing rights manager, Transport planner, and Food Current coping skills/ strengths: Motivation for treatment/growth and Supportive family/friends    SUMMARY: Current SDOH Barriers:  Financial constraints related to reduced Hansen hours and upcoming costs  Clinical  Social Hansen Clinical Goal(s):  patient will follow up with financial resources* as directed by SW  Interventions: Discussed common feeling and emotions when being diagnosed with cancer, and the importance of support during treatment Informed patient of the support team roles and support services at [facility] Provided Chelsea Hansen contact information and encouraged patient to call with any questions or concerns Provided information about local resources (DSS, food pantries, etc) and breast cancer foundations   Follow Up Plan: Patient will Hansen on applications for assistance and contact CSW with any questions or for help submitting Patient verbalizes understanding of plan: Yes    Chelsea Hansen , LCSW

## 2020-12-17 NOTE — Patient Instructions (Signed)
Physical Therapy Information for After Breast Cancer Surgery/Treatment:   Lymphedema is a swelling condition that you may be at risk for in your arm if you have lymph nodes removed from the armpit area.  After a sentinel node biopsy, the risk is approximately 5-9% and is higher after an axillary node dissection.  There is treatment available for this condition and it is not life-threatening.  Contact your physician or physical therapist with concerns.  You may begin the 4 shoulder/posture exercises (see additional sheet) when permitted by your physician (typically a week after surgery).  If you have drains, you may need to wait until those are removed before beginning range of motion exercises.  A general recommendation is to not lift your arms above shoulder height until drains are removed.  These exercises should be done to your tolerance and gently.  This is not a "no pain/no gain" type of recovery so listen to your body and stretch into the range of motion that you can tolerate, stopping if you have pain.  If you are having immediate reconstruction, ask your plastic surgeon about doing exercises as he or she may want you to wait.  We encourage you to attend the free one time ABC (After Breast Cancer) class offered by [facility].  You will learn information related to lymphedema risk, prevention and treatment and additional exercises to regain mobility following surgery.  You can call [phone] for more information.  This is offered the 1st and 3rd Monday of each month.  You only attend the class one time.  While undergoing any medical procedure or treatment, try to avoid blood pressure being taken or needle sticks from occurring on the arm on the side of cancer.   This recommendation begins after surgery and continues for the rest of your life.  This may help reduce your risk of getting lymphedema (swelling in your arm).  An excellent resource for those seeking information  on lymphedema is the National Lymphedema Network's web site. It can be accessed at www.lymphnet.org  If you notice swelling in your hand, arm or breast at any time following surgery (even if it is many years from now), please contact your doctor or physical therapist to discuss this.  Lymphedema can be treated at any time but it is easier for you if it is treated early on.  If you feel like your shoulder motion is not returning to normal in a reasonable amount of time, please contact your surgeon or physical therapist.  Marti C. Nusayba Cadenas, PT, CLT [phone]; [address] [place], [place] [address] ABC CLASS After Breast Cancer Class  After Breast Cancer Class is a specially designed exercise class to assist you in a safe recover after having breast cancer surgery.  In this class you will learn how to get back to full function whether your drains were just removed or if you had surgery a month ago.  This one-time class is held the 1st and 3rd Monday of every month from 11:00 a.m. until 12:00 noon at the [facility] located at [address] [place], [place] [address]  This class is FREE and space is limited. For more information or to register for the next available class, call [phone].  Class Goals   Understand specific stretches to improve the flexibility of you chest and shoulder.  Learn ways to safely strengthen your upper body and improve your posture.  Understand the warning signs of infection and why   you may be at risk for an arm infection.  Learn about Lymphedema and prevention.  ** You do not attend this class until after surgery.  Drains must be removed to participate  Patient was instructed today in a home exercise program today for post op shoulder range of motion. These included active assist shoulder flexion in sitting, scapular retraction, wall walking with shoulder abduction, and hands behind head external rotation.  She was  encouraged to do these twice a day, holding 3 seconds and repeating 5 times when permitted by her physician.     

## 2020-12-17 NOTE — Progress Notes (Signed)
Radiation Oncology         [phone] ________________________________  Multidisciplinary Breast Oncology Clinic [facility]) Initial Outpatient Consultation  Name: Chelsea Hansen MRN: [id number]  Date: 12/17/2020  DOB: June 29, 1970  YS:[id number], [place] Cage, NP  [name], MD   REFERRING PHYSICIAN: [name], MD  DIAGNOSIS: The primary encounter diagnosis was Malignant neoplasm of upper-outer quadrant of right breast in female, estrogen receptor positive ([place]). A diagnosis of Malignant neoplasm of upper-outer quadrant of left breast in female, estrogen receptor positive ([place]) was also pertinent to this visit.  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 Malignant neoplasm of upper-outer quadrant of right breast in female, estrogen receptor positive ([place])  C50.411    Z17.0     2. Malignant neoplasm of upper-outer quadrant of left breast in female, estrogen receptor positive ([place])  C50.412    Z17.0       HISTORY OF PRESENT ILLNESS::Chelsea Hansen is a 50 y.o. female who is presenting to the office today for evaluation of her newly diagnosed breast cancer. She is accompanied by her daughter. She is doing well overall.   She had routine bilateral diagnostic mammography with tomography and bilateral breast ultrasonography at [facility] on 11/18/20 showing a possible abnormality in both breasts. Her right breast showed a suspicious 3.2 cm mass in the UOQ of the right breast at the 10 o'clock position 6 cm from the nipple. Also seen was an intermediate new 1.1 cm mass in the UOQ of the right breast at the 9:30 o'clock position 8 cm from the nipple. Right axillary lymph node was seen to have cortical thickening up to 17m. To the left breast; a highly suspicious  0.8 cm mass was seen involving the UOQ.   Biopsy on 12/03/20 showed: invasive and in situ carcinoma measuring 1.1 cm of the right breast with fibroadenoma (grade 2). Results also showed Invasive ductal carcinoma with scant extracellular mucin measuring 0.8 cm for the left breast biopsy (grade 1-2). Metastatic mammary carcinoma was detected to the right lymph node as well measuring 0.6 cm and shows focal extranodal extension . Prognostic indicators significant for: estrogen receptor, 95% negative and progesterone receptor, 100% positive, both with strong staining intensity. Proliferation marker Ki67 at 15%. HER2 negative.   Menarche: 50years old Age at first live birth: 50years old  GP: 5 LMP: N/A  Contraceptive: none HRT: none   The patient was referred today for presentation in the multidisciplinary conference.  Radiology studies and pathology slides were presented there for review and discussion of treatment options. A consensus was discussed regarding potential next steps.  PREVIOUS RADIATION THERAPY: No  PAST MEDICAL HISTORY:  Past Medical History:  Diagnosis Date   Allergy    seasonal allergies   Anxiety    on meds   Asthma    uses inhaler   [place] [place]   [place]   [place] [place]   RIGHT breast lump-dx 2022   Depression    on meds   [place]   after hysterectomy   GERD (gastroesophageal reflux disease)    with certain foods/OTC PRN meds   Headache(784.0)    Hyperlipidemia    on meds  Hypertension    on meds    PAST SURGICAL HISTORY: Past Surgical History:  Procedure Laterality Date   ABDOMINAL HYSTERECTOMY     BREAST EXCISIONAL BIOPSY Right 09/16/2009   BREAST SURGERY     lumpectomy   TUBAL LIGATION      FAMILY HISTORY:  Family History  Problem Relation Age of Onset   Cancer Mother        ?   Hypertension Father    Colon polyps Neg Hx    Colon cancer Neg Hx    Esophageal cancer Neg Hx     Stomach cancer Neg Hx     SOCIAL HISTORY:  Social History   Socioeconomic History   Marital status: Single    Spouse name: Not on file   Number of children: Not on file   Years of education: Not on file   Highest education level: Not on file  Occupational History   Not on file  Tobacco Use   Smoking status: Never   Smokeless tobacco: Never  Vaping Use   Vaping Use: Never used  Substance and Sexual Activity   Alcohol use: Yes    Alcohol/week: 7.0 standard drinks    Types: 7 Standard drinks or equivalent per week    Comment: occassional   Drug use: No   Sexual activity: Not Currently  Other Topics Concern   Not on file  Social History Narrative   Not on file   Social Determinants of Health   Financial Resource Strain: High Risk   Difficulty of Paying Living Expenses: Hard  Food Insecurity: Food Insecurity Present   Worried About [place] in the Last Year: Sometimes true   Ran Out of Food in the Last Year: Sometimes true  Transportation Needs: No Transportation Needs   Lack of Transportation (Medical): No   Lack of Transportation (Non-Medical): No  Physical Activity: Not on file  Stress: Not on file  Social Connections: Not on file    ALLERGIES: No Known Allergies  MEDICATIONS:  Current Outpatient Medications  Medication Sig Dispense Refill   acetaminophen (TYLENOL) 500 MG tablet Take 2 tablets (1,000 mg total) by mouth every 6 (six) hours as needed. This is maximum dose of tylenol daily. Do not exceed this dose of tylenol from all sources (check other over the counter and prescribed medications) (Patient not taking: No sig reported) 30 tablet 0   ADVAIR HFA 115-21 MCG/ACT inhaler Inhale 2 puffs into the lungs 2 (two) times daily. 1 each 2   albuterol (VENTOLIN HFA) 108 (90 Base) MCG/ACT inhaler Inhale 2 puffs into the lungs every 6 (six) hours as needed for wheezing or shortness of breath. 18 g 2   amLODipine (NORVASC) 10 MG tablet Take 1 tablet (10 mg  total) by mouth daily. (Patient not taking: No sig reported) 90 tablet 1   atorvastatin (LIPITOR) 40 MG tablet Take 1 tablet (40 mg total) by mouth daily. (Patient not taking: Reported on 12/02/2020) 90 tablet 3   Cholecalciferol (VITAMIN D3) 2000 units capsule Take 2,000 Units by mouth daily.  11   escitalopram (LEXAPRO) 20 MG tablet Take 1 tablet (20 mg total) by mouth daily. 90 tablet 1   fluticasone (FLONASE) 50 MCG/ACT nasal spray Place 2 sprays into both nostrils daily. (Patient not taking: No sig reported) 16 g 6   hydrochlorothiazide (HYDRODIURIL) 25 MG tablet Take 1 tablet (25 mg total) by mouth daily. 90 tablet 1   loratadine (CLARITIN) 10 MG tablet Take 1  tablet (10 mg total) by mouth daily. 30 tablet 11   montelukast (SINGULAIR) 10 MG tablet Take 1 tablet (10 mg total) by mouth at bedtime. (Patient not taking: No sig reported) 90 tablet 3   No current facility-administered medications for this encounter.    REVIEW OF SYSTEMS: A 10+ POINT REVIEW OF SYSTEMS WAS OBTAINED including neurology, dermatology, psychiatry, cardiac, respiratory, lymph, extremities, GI, GU, musculoskeletal, constitutional, reproductive, HEENT. On the provided form, she reports having breast pain and palpable lump (right breast). She denies any other symptoms.    PHYSICAL EXAM:  Vitals with BMI 12/17/2020  Height _0   Weight 227 lbs 3 oz  BMI 25.4  Systolic 270  Diastolic 81  Pulse 67   Lungs are clear to auscultation bilaterally. Heart has regular rate and rhythm. No palpable cervical, supraclavicular, or axillary adenopathy. Abdomen soft, non-tender, normal bowel sounds. Breast: Right breast with  large palapbale mass in the UOQ adjacent to the areolar boarder measuring 4 to 5 cm . Patient is very tender to palpation to the right axilla from recent lymph biopsy; no nipple dishcarge or bleeding. Left breast with biopsy changes noted, no palpable mass discharge or bledding. Both breast are large and  pendulous.  KPS = 90  100 - Normal; no complaints; no evidence of disease. 90   - Able to carry on normal activity; minor signs or symptoms of disease. 80   - Normal activity with effort; some signs or symptoms of disease. 3   - Cares for self; unable to carry on normal activity or to do active work. 60   - Requires occasional assistance, but is able to care for most of his personal needs. 50   - Requires considerable assistance and frequent medical care. 60   - Disabled; requires special care and assistance. 31   - Severely disabled; hospital admission is indicated although death not imminent. 29   - Very sick; hospital admission necessary; active supportive treatment necessary. 10   - Moribund; fatal processes progressing rapidly. 0     - Dead  Karnofsky DA, Abelmann [place], Craver LS and Burchenal [facility] [phone]) The use of the nitrogen mustards in the palliative treatment of carcinoma: with particular reference to bronchogenic carcinoma Cancer 1 634-56  LABORATORY DATA:  Lab Results  Component Value Date   WBC 7.8 12/17/2020   HGB 10.9 (L) 12/17/2020   HCT 35.1 (L) 12/17/2020   MCV 73.9 (L) 12/17/2020   PLT 350 12/17/2020   Lab Results  Component Value Date   NA 140 12/17/2020   K 3.4 (L) 12/17/2020   CL 106 12/17/2020   CO2 24 12/17/2020   Lab Results  Component Value Date   ALT 18 12/17/2020   AST 15 12/17/2020   ALKPHOS 125 12/17/2020   BILITOT 0.3 12/17/2020    PULMONARY FUNCTION TEST:   Recent Review Flowsheet Data   There is no flowsheet data to display.     RADIOGRAPHY: US BREAST LTD UNI LEFT INC AXILLA  Result Date: 11/18/2020 CLINICAL DATA:  50 year old with a personal history of excisional biopsy of a fibroadenoma from the [place] of the RIGHT breast in 2011, presenting with a new palpable lump near the site of the prior surgery in the RIGHT breast. Annual evaluation, LEFT breast. EXAM: DIGITAL DIAGNOSTIC BILATERAL MAMMOGRAM WITH TOMOSYNTHESIS AND  CAD; ULTRASOUND RIGHT BREAST LIMITED; ULTRASOUND LEFT BREAST LIMITED TECHNIQUE: Bilateral digital diagnostic mammography and breast tomosynthesis was performed. The images were evaluated with computer-aided detection.;  Targeted ultrasound examination of the right breast was performed; Targeted ultrasound examination of the left breast was performed COMPARISON:  Previous exam(s). The patient's most recent breast imaging was in March, 2011. ACR Breast Density Category b: There are scattered areas of fibroglandular density. FINDINGS: CC and MLO views of both breasts and a spot compression tangential view of the palpable concern in the RIGHT breast were obtained. RIGHT: Corresponding to the palpable concern in the LOWER OUTER QUADRANT is a mass with irregular and microlobular margins measuring on the order of 2 cm in size, associated with architectural distortion and scattered calcifications. In the LOWER OUTER QUADRANT at middle depth is an approximate 1 cm isodense mass without associated architectural distortion or suspicious calcifications, new since the 2011 mammogram. There may be a satellite nodule just medial to this mass. Targeted ultrasound is performed, demonstrating an irregular hypoechoic mass with a hyperechoic halo at the 10 o'clock position approximately 6 cm from nipple measuring approximately 3.2 x 1.7 x 2 5 cm, demonstrating posterior acoustic shadowing and demonstrating internal power Doppler flow, corresponding to the palpable concern. At the 9:30 o'clock position approximately 8 cm from the nipple at middle depth is an oval parallel hypoechoic mass with predominantly circumscribed margins, though the lateral margin is somewhat vague, measuring approximately 1.1 x 0.5 x 1.1 cm, demonstrating no posterior characteristics and no internal power Doppler flow, corresponding to the mammographic mass. A satellite nodule is not identified adjacent to this mass. Sonographic evaluation of the RIGHT axilla  demonstrates a solitary lymph node with eccentric cortical thickening up to 7 mm. LEFT: In the [place] at posterior depth is a mass with irregular margins on the order of 1 cm in size, associated with architectural distortion. In the INNER breast at anterior to middle depth is a circumscribed isodense mass measuring approximately 0.4 cm without associated architectural distortion or suspicious calcifications. Targeted ultrasound is performed, demonstrating an irregular hypoechoic mass with a hyperechoic halo at the 2 o'clock position approximately 10 cm from the nipple measuring approximately 0.8 x 0.7 x 0.5 cm, demonstrating posterior acoustic shadowing and demonstrating peripheral power Doppler flow, corresponding to the mammographic mass. At the 10 o'clock position approximately 6 cm from the nipple are benign clustered cysts measuring approximately 0.3 x 0.3 x 0.2 cm, demonstrating posterior acoustic enhancement and no internal power Doppler flow, corresponding to the mammographic mass. Sonographic evaluation of the LEFT axilla demonstrates no pathologic lymphadenopathy. IMPRESSION: 1. Highly suspicious 3.2 cm mass associated with architectural distortion and scattered calcifications in the UPPER OUTER QUADRANT of the RIGHT breast at the 10 o'clock position 6 cm from the nipple, corresponding to the palpable concern. 2. Indeterminate new 1.1 cm mass in the UPPER OUTER QUADRANT of the RIGHT breast at the 9:30 o'clock position 8 cm from the nipple. 3. Solitary pathologic RIGHT axillary lymph node with eccentric cortical thickening up to 7 mm. 4. Highly suspicious 0.8 cm mass involving the UPPER OUTER QUADRANT of the LEFT breast. 5. No pathologic LEFT axillary lymphadenopathy. RECOMMENDATION: 1. Ultrasound-guided core needle biopsy of the highly suspicious 3.2 cm RIGHT breast mass, the indeterminate 1.1 cm RIGHT breast mass, and the solitary pathologic RIGHT axillary lymph node. 2. Ultrasound-guided  core needle biopsy of the highly suspicious LEFT breast mass. The ultrasound core needle biopsy procedure was discussed with patient and her questions were answered. She wishes to proceed with the biopsies which have been scheduled by the [facility] staff. I have discussed the findings and recommendations with  the patient. BI-RADS CATEGORY  5: Highly suggestive of malignancy. Electronically Signed   By: Evangeline Dakin M.D.   On: 11/18/2020 10:13   US BREAST LTD UNI RIGHT INC AXILLA  Result Date: 11/18/2020 CLINICAL DATA:  50 year old with a personal history of excisional biopsy of a fibroadenoma from the [place] of the RIGHT breast in 2011, presenting with a new palpable lump near the site of the prior surgery in the RIGHT breast. Annual evaluation, LEFT breast. EXAM: DIGITAL DIAGNOSTIC BILATERAL MAMMOGRAM WITH TOMOSYNTHESIS AND CAD; ULTRASOUND RIGHT BREAST LIMITED; ULTRASOUND LEFT BREAST LIMITED TECHNIQUE: Bilateral digital diagnostic mammography and breast tomosynthesis was performed. The images were evaluated with computer-aided detection.; Targeted ultrasound examination of the right breast was performed; Targeted ultrasound examination of the left breast was performed COMPARISON:  Previous exam(s). The patient's most recent breast imaging was in March, 2011. ACR Breast Density Category b: There are scattered areas of fibroglandular density. FINDINGS: CC and MLO views of both breasts and a spot compression tangential view of the palpable concern in the RIGHT breast were obtained. RIGHT: Corresponding to the palpable concern in the LOWER OUTER QUADRANT is a mass with irregular and microlobular margins measuring on the order of 2 cm in size, associated with architectural distortion and scattered calcifications. In the LOWER OUTER QUADRANT at middle depth is an approximate 1 cm isodense mass without associated architectural distortion or suspicious calcifications, new since the 2011 mammogram.  There may be a satellite nodule just medial to this mass. Targeted ultrasound is performed, demonstrating an irregular hypoechoic mass with a hyperechoic halo at the 10 o'clock position approximately 6 cm from nipple measuring approximately 3.2 x 1.7 x 2 5 cm, demonstrating posterior acoustic shadowing and demonstrating internal power Doppler flow, corresponding to the palpable concern. At the 9:30 o'clock position approximately 8 cm from the nipple at middle depth is an oval parallel hypoechoic mass with predominantly circumscribed margins, though the lateral margin is somewhat vague, measuring approximately 1.1 x 0.5 x 1.1 cm, demonstrating no posterior characteristics and no internal power Doppler flow, corresponding to the mammographic mass. A satellite nodule is not identified adjacent to this mass. Sonographic evaluation of the RIGHT axilla demonstrates a solitary lymph node with eccentric cortical thickening up to 7 mm. LEFT: In the [place] at posterior depth is a mass with irregular margins on the order of 1 cm in size, associated with architectural distortion. In the INNER breast at anterior to middle depth is a circumscribed isodense mass measuring approximately 0.4 cm without associated architectural distortion or suspicious calcifications. Targeted ultrasound is performed, demonstrating an irregular hypoechoic mass with a hyperechoic halo at the 2 o'clock position approximately 10 cm from the nipple measuring approximately 0.8 x 0.7 x 0.5 cm, demonstrating posterior acoustic shadowing and demonstrating peripheral power Doppler flow, corresponding to the mammographic mass. At the 10 o'clock position approximately 6 cm from the nipple are benign clustered cysts measuring approximately 0.3 x 0.3 x 0.2 cm, demonstrating posterior acoustic enhancement and no internal power Doppler flow, corresponding to the mammographic mass. Sonographic evaluation of the LEFT axilla demonstrates no pathologic  lymphadenopathy. IMPRESSION: 1. Highly suspicious 3.2 cm mass associated with architectural distortion and scattered calcifications in the UPPER OUTER QUADRANT of the RIGHT breast at the 10 o'clock position 6 cm from the nipple, corresponding to the palpable concern. 2. Indeterminate new 1.1 cm mass in the UPPER OUTER QUADRANT of the RIGHT breast at the 9:30 o'clock position 8 cm from the nipple.  3. Solitary pathologic RIGHT axillary lymph node with eccentric cortical thickening up to 7 mm. 4. Highly suspicious 0.8 cm mass involving the UPPER OUTER QUADRANT of the LEFT breast. 5. No pathologic LEFT axillary lymphadenopathy. RECOMMENDATION: 1. Ultrasound-guided core needle biopsy of the highly suspicious 3.2 cm RIGHT breast mass, the indeterminate 1.1 cm RIGHT breast mass, and the solitary pathologic RIGHT axillary lymph node. 2. Ultrasound-guided core needle biopsy of the highly suspicious LEFT breast mass. The ultrasound core needle biopsy procedure was discussed with patient and her questions were answered. She wishes to proceed with the biopsies which have been scheduled by the [facility] staff. I have discussed the findings and recommendations with the patient. BI-RADS CATEGORY  5: Highly suggestive of malignancy. Electronically Signed   By: Evangeline Dakin M.D.   On: 11/18/2020 10:13   MM DIAG BREAST TOMO BILATERAL  Result Date: 11/18/2020 CLINICAL DATA:  50 year old with a personal history of excisional biopsy of a fibroadenoma from the [place] of the RIGHT breast in 2011, presenting with a new palpable lump near the site of the prior surgery in the RIGHT breast. Annual evaluation, LEFT breast. EXAM: DIGITAL DIAGNOSTIC BILATERAL MAMMOGRAM WITH TOMOSYNTHESIS AND CAD; ULTRASOUND RIGHT BREAST LIMITED; ULTRASOUND LEFT BREAST LIMITED TECHNIQUE: Bilateral digital diagnostic mammography and breast tomosynthesis was performed. The images were evaluated with computer-aided detection.; Targeted  ultrasound examination of the right breast was performed; Targeted ultrasound examination of the left breast was performed COMPARISON:  Previous exam(s). The patient's most recent breast imaging was in March, 2011. ACR Breast Density Category b: There are scattered areas of fibroglandular density. FINDINGS: CC and MLO views of both breasts and a spot compression tangential view of the palpable concern in the RIGHT breast were obtained. RIGHT: Corresponding to the palpable concern in the LOWER OUTER QUADRANT is a mass with irregular and microlobular margins measuring on the order of 2 cm in size, associated with architectural distortion and scattered calcifications. In the LOWER OUTER QUADRANT at middle depth is an approximate 1 cm isodense mass without associated architectural distortion or suspicious calcifications, new since the 2011 mammogram. There may be a satellite nodule just medial to this mass. Targeted ultrasound is performed, demonstrating an irregular hypoechoic mass with a hyperechoic halo at the 10 o'clock position approximately 6 cm from nipple measuring approximately 3.2 x 1.7 x 2 5 cm, demonstrating posterior acoustic shadowing and demonstrating internal power Doppler flow, corresponding to the palpable concern. At the 9:30 o'clock position approximately 8 cm from the nipple at middle depth is an oval parallel hypoechoic mass with predominantly circumscribed margins, though the lateral margin is somewhat vague, measuring approximately 1.1 x 0.5 x 1.1 cm, demonstrating no posterior characteristics and no internal power Doppler flow, corresponding to the mammographic mass. A satellite nodule is not identified adjacent to this mass. Sonographic evaluation of the RIGHT axilla demonstrates a solitary lymph node with eccentric cortical thickening up to 7 mm. LEFT: In the [place] at posterior depth is a mass with irregular margins on the order of 1 cm in size, associated with architectural  distortion. In the INNER breast at anterior to middle depth is a circumscribed isodense mass measuring approximately 0.4 cm without associated architectural distortion or suspicious calcifications. Targeted ultrasound is performed, demonstrating an irregular hypoechoic mass with a hyperechoic halo at the 2 o'clock position approximately 10 cm from the nipple measuring approximately 0.8 x 0.7 x 0.5 cm, demonstrating posterior acoustic shadowing and demonstrating peripheral power Doppler flow, corresponding  to the mammographic mass. At the 10 o'clock position approximately 6 cm from the nipple are benign clustered cysts measuring approximately 0.3 x 0.3 x 0.2 cm, demonstrating posterior acoustic enhancement and no internal power Doppler flow, corresponding to the mammographic mass. Sonographic evaluation of the LEFT axilla demonstrates no pathologic lymphadenopathy. IMPRESSION: 1. Highly suspicious 3.2 cm mass associated with architectural distortion and scattered calcifications in the UPPER OUTER QUADRANT of the RIGHT breast at the 10 o'clock position 6 cm from the nipple, corresponding to the palpable concern. 2. Indeterminate new 1.1 cm mass in the UPPER OUTER QUADRANT of the RIGHT breast at the 9:30 o'clock position 8 cm from the nipple. 3. Solitary pathologic RIGHT axillary lymph node with eccentric cortical thickening up to 7 mm. 4. Highly suspicious 0.8 cm mass involving the UPPER OUTER QUADRANT of the LEFT breast. 5. No pathologic LEFT axillary lymphadenopathy. RECOMMENDATION: 1. Ultrasound-guided core needle biopsy of the highly suspicious 3.2 cm RIGHT breast mass, the indeterminate 1.1 cm RIGHT breast mass, and the solitary pathologic RIGHT axillary lymph node. 2. Ultrasound-guided core needle biopsy of the highly suspicious LEFT breast mass. The ultrasound core needle biopsy procedure was discussed with patient and her questions were answered. She wishes to proceed with the biopsies which have been  scheduled by the [facility] staff. I have discussed the findings and recommendations with the patient. BI-RADS CATEGORY  5: Highly suggestive of malignancy. Electronically Signed   By: Evangeline Dakin M.D.   On: 11/18/2020 10:13   [place] AXILLARY NODE CORE BIOPSY RIGHT  Addendum Date: 12/04/2020   ADDENDUM REPORT: 12/04/2020 14:30 ADDENDUM: Pathology revealed FIBROADENOMA of the RIGHT breast, 9:30 o'clock, (heart clip). This was found to be concordant by Dr. Dorise Bullion. 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 This was found to be concordant by Dr. Dorise Bullion. 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 This was found to be concordant by Dr. Dorise Bullion. 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 This was found to be concordant by Dr. Dorise Bullion. Pathology results were discussed with the patient by telephone. The patient reported doing well after the biopsies with tenderness and oozing at the sites. Post biopsy instructions and care were reviewed and questions were answered. The patient was encouraged to call [facility] for any additional concerns. My direct phone number was provided. The patient was referred to [facility] at [facility] on December 17, 2020. The patient was offered an appointment for December 10, 2020, she will be out of the country until December 16, 2020. Pathology results reported by Terie Purser, RN on 12/04/2020. Electronically Signed   By: Dorise Bullion III M.D   On: 12/04/2020 14:30   Result Date: 12/04/2020 CLINICAL DATA:  Biopsy of a right breast mass at 9:30, a right breast mass at 10 o'clock, a right axillary lymph node, and a left breast  mass at 2 o'clock. EXAM: ULTRASOUND GUIDED BILATERAL BREAST CORE NEEDLE BIOPSY COMPARISON:  Previous exam(s). PROCEDURE: I met with the patient and we discussed the procedure of ultrasound-guided biopsy, including benefits and alternatives. We discussed the high likelihood of a successful procedure. We discussed the risks of the procedure, including infection, bleeding, tissue injury, clip migration, and inadequate sampling. Informed  written consent was given. The usual time-out protocol was performed immediately prior to the procedure. Lesion quadrant: Right breast 930 Using sterile technique and 1% Lidocaine as local anesthetic, under direct ultrasound visualization, a 12 gauge spring-loaded device was used to perform biopsy of a 930 right breast mass using a lateral approach. At the conclusion of the procedure a heart shaped tissue marker clip was deployed into the biopsy cavity. Follow up 2 view mammogram was performed and dictated separately. Lesion quadrant: Right breast, 10 o'clock Using sterile technique and 1% Lidocaine as local anesthetic, under direct ultrasound visualization, a 12 gauge spring-loaded device was used to perform biopsy of a 10 o'clock right breast mass using a lateral approach. At the conclusion of the procedure a coil shaped tissue marker clip was deployed into the biopsy cavity. Follow up 2 view mammogram was performed and dictated separately. Lesion quadrant: Right axilla Using sterile technique and 1% Lidocaine as local anesthetic, under direct ultrasound visualization, a 14 gauge spring-loaded device was used to perform biopsy of a right axillary lymph node using a lateral approach. At the conclusion of the procedure a tri bell tissue marker clip was deployed into the biopsy cavity. Follow up 2 view mammogram was performed and dictated separately. Lesion quadrant: Left breast, 2 o'clock Using sterile technique and 1% Lidocaine as local anesthetic, under direct ultrasound  visualization, a 12 gauge spring-loaded device was used to perform biopsy of a 2 o'clock left breast mass using a lateral approach. At the conclusion of the procedure a coil shaped tissue marker clip was deployed into the biopsy cavity. Follow up 2 view mammogram was performed and dictated separately. IMPRESSION: Ultrasound guided biopsy of 2 right breast masses, a right axillary lymph node, and a left breast mass. No apparent complications. Electronically Signed: By: Dorise Bullion III M.D On: 12/03/2020 15:46  MM CLIP PLACEMENT LEFT  Result Date: 12/03/2020 CLINICAL DATA:  Evaluate biopsy markers EXAM: DIAGNOSTIC BILATERAL MAMMOGRAM POST ULTRASOUND BIOPSY COMPARISON:  Previous exam(s). FINDINGS: Mammographic images were obtained following ultrasound guided biopsy of a 930 right breast mass, a 10 o'clock right breast mass, a right axillary lymph node, and a 2 o'clock left breast mass. On the right, the heart shaped clip is within the biopsied 930 right breast mass. The coil shaped clip is within the 10 o'clock right breast mass. A tri bell clip is within the biopsied right axillary node. A coil shaped clip is within the 2 o'clock left breast mass. IMPRESSION: Appropriate positioning of all biopsy clips as above. On the right, the heart shaped clip is within the biopsied 930 right breast mass. The coil shaped clip is within the 10 o'clock right breast mass. A tri bell clip is within the biopsied right axillary node. A coil shaped clip is within the left 2 o'clock breast mass. Final Assessment: Post Procedure Mammograms for Marker Placement Electronically Signed   By: Dorise Bullion III M.D   On: 12/03/2020 15:58  MM CLIP PLACEMENT RIGHT  Result Date: 12/03/2020 CLINICAL DATA:  Evaluate biopsy markers EXAM: DIAGNOSTIC BILATERAL MAMMOGRAM POST ULTRASOUND BIOPSY COMPARISON:  Previous exam(s). FINDINGS: Mammographic images were obtained following ultrasound guided biopsy of a 930 right breast mass, a 10  o'clock right breast mass, a right axillary lymph node, and a 2 o'clock left breast mass. On the right, the heart shaped clip is within the biopsied 930 right breast mass. The coil shaped clip is within the 10 o'clock right breast mass. A tri bell clip is within  the biopsied right axillary node. A coil shaped clip is within the 2 o'clock left breast mass. IMPRESSION: Appropriate positioning of all biopsy clips as above. On the right, the heart shaped clip is within the biopsied 930 right breast mass. The coil shaped clip is within the 10 o'clock right breast mass. A tri bell clip is within the biopsied right axillary node. A coil shaped clip is within the left 2 o'clock breast mass. Final Assessment: Post Procedure Mammograms for Marker Placement Electronically Signed   By: Dorise Bullion III M.D   On: 12/03/2020 15:58  [place] LT BREAST BX W LOC DEV 1ST LESION IMG BX SPEC US GUIDE  Addendum Date: 12/04/2020   ADDENDUM REPORT: 12/04/2020 14:30 ADDENDUM: Pathology revealed FIBROADENOMA of the RIGHT breast, 9:30 o'clock, (heart clip). This was found to be concordant by Dr. Dorise Bullion. 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 This was found to be concordant by Dr. Dorise Bullion. 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 This was found to be concordant by Dr. Dorise Bullion. 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 This was found to be concordant by Dr. Dorise Bullion. Pathology results were discussed with the patient by telephone. The patient reported doing well after the biopsies with tenderness and oozing at the sites. Post biopsy instructions and care were reviewed and questions were answered. The patient was encouraged to call [facility] for any additional concerns. My direct phone number was provided. The patient was referred to [facility] at [facility] on December 17, 2020. The patient was offered an appointment for December 10, 2020, she will be out of the country until December 16, 2020. Pathology results reported by Terie Purser, RN on 12/04/2020. Electronically Signed   By: Dorise Bullion III M.D   On: 12/04/2020 14:30   Result Date: 12/04/2020 CLINICAL DATA:  Biopsy of a right breast mass at 9:30, a right breast mass at 10 o'clock, a right axillary lymph node, and a left breast mass at 2 o'clock. EXAM: ULTRASOUND GUIDED BILATERAL BREAST CORE NEEDLE BIOPSY COMPARISON:  Previous exam(s). PROCEDURE: I met with the patient and we discussed the procedure of ultrasound-guided biopsy, including benefits and alternatives. We discussed the high likelihood of a successful procedure. We discussed the risks of the procedure, including infection, bleeding, tissue injury, clip migration, and inadequate sampling. Informed written consent was given. The usual time-out protocol was performed immediately prior to the procedure. Lesion quadrant: Right breast 930 Using sterile technique and 1% Lidocaine as local anesthetic, under direct ultrasound visualization, a 12 gauge spring-loaded device was used to perform biopsy of a 930 right breast mass using a lateral approach. At the conclusion of the procedure a heart shaped tissue marker clip was deployed into the biopsy cavity. Follow up 2 view mammogram was performed and dictated separately. Lesion quadrant: Right breast, 10 o'clock Using sterile technique and 1% Lidocaine as local anesthetic, under direct ultrasound visualization, a 12 gauge spring-loaded device was used to perform biopsy of a 10 o'clock right breast mass using a lateral approach. At the conclusion of the procedure a coil shaped tissue marker clip was deployed into the  biopsy cavity. Follow up 2 view mammogram was performed and dictated separately. Lesion  quadrant: Right axilla Using sterile technique and 1% Lidocaine as local anesthetic, under direct ultrasound visualization, a 14 gauge spring-loaded device was used to perform biopsy of a right axillary lymph node using a lateral approach. At the conclusion of the procedure a tri bell tissue marker clip was deployed into the biopsy cavity. Follow up 2 view mammogram was performed and dictated separately. Lesion quadrant: Left breast, 2 o'clock Using sterile technique and 1% Lidocaine as local anesthetic, under direct ultrasound visualization, a 12 gauge spring-loaded device was used to perform biopsy of a 2 o'clock left breast mass using a lateral approach. At the conclusion of the procedure a coil shaped tissue marker clip was deployed into the biopsy cavity. Follow up 2 view mammogram was performed and dictated separately. IMPRESSION: Ultrasound guided biopsy of 2 right breast masses, a right axillary lymph node, and a left breast mass. No apparent complications. Electronically Signed: By: Dorise Bullion III M.D On: 12/03/2020 15:46  [place] RT BREAST BX W LOC DEV 1ST LESION IMG BX SPEC US GUIDE  Addendum Date: 12/04/2020   ADDENDUM REPORT: 12/04/2020 14:30 ADDENDUM: Pathology revealed FIBROADENOMA of the RIGHT breast, 9:30 o'clock, (heart clip). This was found to be concordant by Dr. Dorise Bullion. 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 This was found to be concordant by Dr. Dorise Bullion. 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 This was found to be concordant by Dr. Dorise Bullion. 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 This was found to be concordant by Dr. Dorise Bullion. Pathology results were discussed with the patient by telephone. The patient reported doing well after the biopsies with tenderness and oozing at the sites. Post biopsy instructions and care were reviewed and questions were answered. The patient was encouraged to call [facility] for any additional concerns. My direct phone number was provided. The patient was referred to [facility] at [facility] on December 17, 2020. The patient was offered an appointment for December 10, 2020, she will be out of the country until December 16, 2020. Pathology results reported by Terie Purser, RN on 12/04/2020. Electronically Signed   By: Dorise Bullion III M.D   On: 12/04/2020 14:30   Result Date: 12/04/2020 CLINICAL DATA:  Biopsy of a right breast mass at 9:30, a right breast mass at 10 o'clock, a right axillary lymph node, and a left breast mass at 2 o'clock. EXAM: ULTRASOUND GUIDED BILATERAL BREAST CORE NEEDLE BIOPSY COMPARISON:  Previous exam(s). PROCEDURE: I met with the patient and we discussed the procedure of ultrasound-guided biopsy, including benefits and alternatives. We discussed the high likelihood of a successful procedure. We discussed the risks of the procedure, including infection, bleeding, tissue injury, clip migration, and inadequate sampling. Informed written consent was given. The usual time-out protocol was performed immediately prior to the procedure. Lesion quadrant: Right breast 930 Using sterile technique and 1% Lidocaine as local anesthetic, under direct ultrasound visualization, a 12 gauge spring-loaded device was used to perform biopsy of a 930 right breast mass using a lateral approach. At the conclusion of the procedure a heart shaped tissue marker clip was deployed into the biopsy cavity. Follow up 2 view mammogram was performed and dictated separately. Lesion quadrant: Right breast,  10 o'clock Using sterile  technique and 1% Lidocaine as local anesthetic, under direct ultrasound visualization, a 12 gauge spring-loaded device was used to perform biopsy of a 10 o'clock right breast mass using a lateral approach. At the conclusion of the procedure a coil shaped tissue marker clip was deployed into the biopsy cavity. Follow up 2 view mammogram was performed and dictated separately. Lesion quadrant: Right axilla Using sterile technique and 1% Lidocaine as local anesthetic, under direct ultrasound visualization, a 14 gauge spring-loaded device was used to perform biopsy of a right axillary lymph node using a lateral approach. At the conclusion of the procedure a tri bell tissue marker clip was deployed into the biopsy cavity. Follow up 2 view mammogram was performed and dictated separately. Lesion quadrant: Left breast, 2 o'clock Using sterile technique and 1% Lidocaine as local anesthetic, under direct ultrasound visualization, a 12 gauge spring-loaded device was used to perform biopsy of a 2 o'clock left breast mass using a lateral approach. At the conclusion of the procedure a coil shaped tissue marker clip was deployed into the biopsy cavity. Follow up 2 view mammogram was performed and dictated separately. IMPRESSION: Ultrasound guided biopsy of 2 right breast masses, a right axillary lymph node, and a left breast mass. No apparent complications. Electronically Signed: By: Dorise Bullion III M.D On: 12/03/2020 15:46  [place] RT BREAST BX W LOC DEV EA ADD LESION IMG BX SPEC US GUIDE  Addendum Date: 12/04/2020   ADDENDUM REPORT: 12/04/2020 14:30 ADDENDUM: Pathology revealed FIBROADENOMA of the RIGHT breast, 9:30 o'clock, (heart clip). This was found to be concordant by Dr. Dorise Bullion. 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 This was found to be concordant by Dr. Dorise Bullion. Pathology revealed METASTATIC MAMMARY CARCINOMA TO A LYMPH NODE  WITH FOCAL EXTRANODAL EXTENSION of the RIGHT axilla, (tribell clip). This was found to be concordant by Dr. Dorise Bullion. 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 This was found to be concordant by Dr. Dorise Bullion. Pathology results were discussed with the patient by telephone. The patient reported doing well after the biopsies with tenderness and oozing at the sites. Post biopsy instructions and care were reviewed and questions were answered. The patient was encouraged to call [facility] for any additional concerns. My direct phone number was provided. The patient was referred to [facility] at [facility] on December 17, 2020. The patient was offered an appointment for December 10, 2020, she will be out of the country until December 16, 2020. Pathology results reported by Terie Purser, RN on 12/04/2020. Electronically Signed   By: Dorise Bullion III M.D   On: 12/04/2020 14:30   Result Date: 12/04/2020 CLINICAL DATA:  Biopsy of a right breast mass at 9:30, a right breast mass at 10 o'clock, a right axillary lymph node, and a left breast mass at 2 o'clock. EXAM: ULTRASOUND GUIDED BILATERAL BREAST CORE NEEDLE BIOPSY COMPARISON:  Previous exam(s). PROCEDURE: I met with the patient and we discussed the procedure of ultrasound-guided biopsy, including benefits and alternatives. We discussed the high likelihood of a successful procedure. We discussed the risks of the procedure, including infection, bleeding, tissue injury, clip migration, and inadequate sampling. Informed written consent was given. The usual time-out protocol was performed immediately prior to the procedure. Lesion quadrant: Right  breast 930 Using sterile technique and 1% Lidocaine as local anesthetic, under direct ultrasound visualization, a 12 gauge spring-loaded device was  used to perform biopsy of a 930 right breast mass using a lateral approach. At the conclusion of the procedure a heart shaped tissue marker clip was deployed into the biopsy cavity. Follow up 2 view mammogram was performed and dictated separately. Lesion quadrant: Right breast, 10 o'clock Using sterile technique and 1% Lidocaine as local anesthetic, under direct ultrasound visualization, a 12 gauge spring-loaded device was used to perform biopsy of a 10 o'clock right breast mass using a lateral approach. At the conclusion of the procedure a coil shaped tissue marker clip was deployed into the biopsy cavity. Follow up 2 view mammogram was performed and dictated separately. Lesion quadrant: Right axilla Using sterile technique and 1% Lidocaine as local anesthetic, under direct ultrasound visualization, a 14 gauge spring-loaded device was used to perform biopsy of a right axillary lymph node using a lateral approach. At the conclusion of the procedure a tri bell tissue marker clip was deployed into the biopsy cavity. Follow up 2 view mammogram was performed and dictated separately. Lesion quadrant: Left breast, 2 o'clock Using sterile technique and 1% Lidocaine as local anesthetic, under direct ultrasound visualization, a 12 gauge spring-loaded device was used to perform biopsy of a 2 o'clock left breast mass using a lateral approach. At the conclusion of the procedure a coil shaped tissue marker clip was deployed into the biopsy cavity. Follow up 2 view mammogram was performed and dictated separately. IMPRESSION: Ultrasound guided biopsy of 2 right breast masses, a right axillary lymph node, and a left breast mass. No apparent complications. 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 We discussed the general course of radiation, potential side effects, and toxicities with radiation and the patient is interested in this approach.   Plan could change depending on results of the MRI.    PLAN:  MRI  Genetics  Right lumpectomy with targeted axillary node dissection  Left lumpectomy with SLN Mammaprint (on initial right biopsies)  Adjuvant radiation therapy to both breasts Aromatase inhibitor    ------------------------------------------------  Blair Promise, PhD, MD  This document serves as a record of services personally performed by [name], MD. It was created on his behalf by Roney Mans, a trained medical scribe. The creation of this record is based on the scribe's personal observations and the provider's statements to them. This document has been checked and approved by the attending provider.

## 2020-12-17 NOTE — Therapy (Signed)
[place], [place], [address] Phone: [phone]   Fax:  [phone]  Physical Therapy Evaluation  Patient Details  Name: LARRA CRUNKLETON MRN: [id number] Date of Birth: December 21, 1970 Referring Provider (PT): Dr. [name]   Encounter Date: 12/17/2020   PT End of Session - 12/17/20 1156     Visit Number 1    Number of Visits 2    Date for PT Re-Evaluation 02/11/21    PT Start Time 0911    PT Stop Time 0937   Also saw pt from 1012-1027 for a total of 41 min   PT Time Calculation (min) 26 min    Activity Tolerance Patient tolerated treatment well    Behavior During Therapy [facility] for tasks assessed/performed             Past Medical History:  Diagnosis Date   Allergy    seasonal allergies   Anxiety    on meds   Asthma    uses inhaler   Breast cancer ([place]) 2012   RIGHT lumpectomy   Cancer ([place]) 2022   RIGHT breast lump-dx 2022   Depression    on meds   DVT (deep venous thrombosis) ([place]) 2010   after hysterectomy   GERD (gastroesophageal reflux disease)    with certain foods/OTC PRN meds   Headache(784.0)    Hyperlipidemia    on meds   Hypertension    on meds    Past Surgical History:  Procedure Laterality Date   ABDOMINAL HYSTERECTOMY     BREAST EXCISIONAL BIOPSY Right 09/16/2009   BREAST SURGERY     lumpectomy   TUBAL LIGATION      There were no vitals filed for this visit.    Subjective Assessment - 12/17/20 1129     Subjective Patient reports she is here today to be seen by her medical team for her newly diagnosed bilateral breast cancer.    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 The right breast has 2 masses in the upper outer quadrant measuring 3.2 cm and 1.1 cm with a positive axillary lymph node. The left breast mass  measures 8 mm in the upper outer quadrant. Both cancers are ER/PR positive and HER2 negative with a Ki67 of 40% on the right and 15% on the left.    Patient Stated Goals Reduce lymphedema risk and learn post op shoulder ROM HEP    Currently in Pain? Yes    Pain Score --   "Alot"   Pain Location Breast    Pain Orientation Right;Left    Pain Descriptors / Indicators Sharp    Pain Type Surgical pain    Pain Onset 1 to 4 weeks ago    Pain Frequency Intermittent    Aggravating Factors  Pain is from having breast biopsies    Pain Relieving Factors Nothing    Multiple Pain Sites No                [facility] PT Assessment - 12/17/20 0001       Assessment   Medical Diagnosis Bilateral breast cancer    Referring Provider (PT) Dr. [name]    Onset Date/Surgical Date 11/18/20    Hand Dominance Right    Prior Therapy None      Precautions   Precautions Other (comment)    Precaution Comments  active cancer      Restrictions   Weight Bearing Restrictions No      Balance Screen   Has the patient fallen in the past 6 months No    Has the patient had a decrease in activity level because of a fear of falling?  No    Is the patient reluctant to leave their home because of a fear of falling?  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      Cognition   Overall Cognitive Status Within Functional Limits for tasks assessed      Posture/Postural Control   Posture/Postural Control Postural limitations    Postural Limitations Rounded Shoulders;Forward head      ROM / Strength   AROM / PROM / Strength AROM;Strength      AROM   Overall AROM Comments Cervical AROM is WNL    AROM Assessment Site  Shoulder    Right/Left Shoulder Right;Left    Right Shoulder Extension 57 Degrees    Right Shoulder Flexion 139 Degrees    Right Shoulder ABduction 161 Degrees    Right Shoulder Internal Rotation 55 Degrees    Right Shoulder External Rotation 73 Degrees    Left Shoulder Extension 55 Degrees    Left Shoulder Flexion 145 Degrees    Left Shoulder ABduction 155 Degrees    Left Shoulder Internal Rotation 62 Degrees    Left Shoulder External Rotation 78 Degrees      Strength   Overall Strength Within functional limits for tasks performed               LYMPHEDEMA/ONCOLOGY QUESTIONNAIRE - 12/17/20 0001       Type   Cancer Type Bilateral breast cancer      Lymphedema Assessments   Lymphedema Assessments Upper extremities      Right Upper Extremity Lymphedema   10 cm Proximal to Olecranon Process 30.7 cm    Olecranon Process 26 cm    10 cm Proximal to Ulnar Styloid Process 22.2 cm    Just Proximal to Ulnar Styloid Process 17.1 cm    Across Hand at [employer] 20.6 cm    At [place] of 2nd Digit 7 cm      Left Upper Extremity Lymphedema   10 cm Proximal to Olecranon Process 31 cm    Olecranon Process 27 cm    10 cm Proximal to Ulnar Styloid Process 20.9 cm    Just Proximal to Ulnar Styloid Process 16.7 cm    Across Hand at [employer] 21.5 cm    At [place] of 2nd Digit 6.9 cm             L-DEX FLOWSHEETS - 12/17/20 1100       L-DEX LYMPHEDEMA SCREENING   Measurement Type Bilateral    L-DEX MEASUREMENT EXTREMITY Upper Extremity    POSITION  Standing    DOMINANT SIDE Right    BASELINE RIGHT 0.2    BASELINE LEFT -1.3             The patient was assessed using the L-Dex machine today to produce a lymphedema  index baseline score. The patient will be reassessed on a regular basis (typically every 3 months) to obtain new L-Dex scores. If the score is > 6.5 points away from his/her baseline score indicating onset of subclinical lymphedema, it will be recommended to  wear a compression garment for 4 weeks, 12 hours per day and then be reassessed. If the score continues to be > 6.5 points from baseline at reassessment, we will initiate lymphedema treatment. Assessing in this manner has a 95% rate of preventing clinically significant lymphedema.      Katina Dung - 12/17/20 0001     Open a tight or new jar No difficulty    Do heavy household chores (wash walls, wash floors) Moderate difficulty    Carry a shopping bag or briefcase Moderate difficulty    Wash your back No difficulty    Use a knife to cut food No difficulty    Recreational activities in which you take some force or impact through your arm, shoulder, or hand (golf, hammering, tennis) Severe difficulty    During the past week, to what extent has your arm, shoulder or hand problem interfered with your normal social activities with family, friends, neighbors, or groups? Not at all    During the past week, to what extent has your arm, shoulder or hand problem limited your work or other regular daily activities Not at all    Arm, shoulder, or hand pain. None    Tingling (pins and needles) in your arm, shoulder, or hand None    Difficulty Sleeping No difficulty    DASH Score 15.91 %              Objective measurements completed on examination: See above findings.                    PT Long Term Goals - 12/17/20 1151       PT LONG TERM GOAL #1   Title Patient will demonstrate she has regained full shoulder ROM and function post operatively compared to baselines.    Time 8    Period Weeks    Status New    Target Date 02/11/21             Breast Clinic Goals - 12/17/20 1151       Patient will be able to verbalize understanding of pertinent lymphedema risk reduction practices relevant to her diagnosis specifically related to skin care.   Time 1    Period Days    Status Achieved      Patient will be able to return demonstrate and/or verbalize understanding of the  post-op home exercise program related to regaining shoulder range of motion.   Time 1    Period Days    Status Achieved      Patient will be able to verbalize understanding of the importance of attending the postoperative After Breast Cancer Class for further lymphedema risk reduction education and therapeutic exercise.   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 The right breast has 2 masses in the upper outer quadrant measuring 3.2 cm and 1.1 cm with a positive axillary lymph node. The left breast mass measures 8 mm in  the upper outer quadrant. Both cancers are ER/PR positive and HER2 negative with a Ki67 of 40% on the right and 15% on the left. Her multidisciplinary medical team met prior to her assessments to determine a recommended treatment plan. She is planning to have an MRI followed most likely by bilateral lumpectomies with a left sentinel node biopsy and a right targeted axillary dissection, followed by radiation and anti-estrogen therapy. Prior to surgery, she will have Mammaprint testing done on right right core biopsy.    Stability/Clinical Decision Making Stable/Uncomplicated    Clinical Decision Making Low    Rehab Potential Excellent    PT Frequency --   Eval and 1 f/u visit   PT Treatment/Interventions ADLs/Self Care Home Management;Therapeutic exercise;Patient/family education    PT Next Visit Plan Will reassess 3-4 weeks post op    PT Home Exercise Plan Post op shoulder ROM HEP    Consulted and Agree with Plan of Care Patient;Family member/caregiver    Family Member Consulted Daughter             Patient will benefit from skilled therapeutic intervention in order to improve the following deficits and impairments:  Postural dysfunction, Decreased range of motion,  Decreased knowledge of precautions, Impaired UE functional use, Pain  Visit Diagnosis: Malignant neoplasm of upper-outer quadrant of right breast in female, estrogen receptor positive ([place]) - Plan: PT plan of care cert/re-cert  Malignant neoplasm of upper-outer quadrant of left breast in female, estrogen receptor positive ([place]) - Plan: PT plan of care cert/re-cert  Abnormal posture - Plan: PT plan of care cert/re-cert  Patient will follow up at outpatient cancer rehab 3-4 weeks following surgery.  If the patient requires physical therapy at that time, a specific plan will be dictated and sent to the referring physician for approval. The patient was educated today on appropriate basic range of motion exercises to begin post operatively and the importance of attending the After Breast Cancer class following surgery.  Patient was educated today on lymphedema risk reduction practices as it pertains to recommendations that will benefit the patient immediately following surgery.  She verbalized good understanding.      Problem List Patient Active Problem List   Diagnosis Date Noted   Malignant neoplasm of upper-outer quadrant of right breast in female, estrogen receptor positive ([place]) 12/12/2020   Malignant neoplasm of upper-outer quadrant of left breast in female, estrogen receptor positive ([place]) 12/12/2020   Hypokalemia 01/02/2012   Asthma 01/01/2012   Bradycardia 01/01/2012   Syncope 12/31/2011   Mediastinal adenopathy 12/31/2011   Anemia 12/31/2011   Chest pain 12/31/2011   Annia Friendly, PT 12/17/20 11:57 AM   [place], [place], [address] Phone: [phone]   Fax:  [phone]  Name: DANALI MARINOS MRN: [id number] Date of Birth: 1971/05/19

## 2020-12-17 NOTE — Progress Notes (Signed)
REFERRING PROVIDER: [name], MD [address] [place],  [place] [address]  PRIMARY PROVIDER:  [name], NP  PRIMARY REASON FOR VISIT:  1. Malignant neoplasm of upper-outer quadrant of right breast in female, estrogen receptor positive ([place])   2. Malignant neoplasm of upper-outer quadrant of left breast in female, estrogen receptor positive ([place])   3. Family history of uterine cancer      HISTORY OF PRESENT ILLNESS:   Chelsea Hansen, a 50 y.o. female, was seen for a [place] cancer genetics consultation at the request of Dr. Jana Hakim due to a personal and family history of cancer.  Chelsea Hansen presents to clinic today to discuss the possibility of a hereditary predisposition to cancer, genetic testing, and to further clarify her future cancer risks, as well as potential cancer risks for family members.   In June of 2022, at the age of 16, Chelsea Hansen was diagnosed with bilateral breast cancer - invasive lobular carcinoma of the right breast (ER+/PR+/Her2-) and invasive ductal carcinoma of the left breast (ER+/PR+/Her2-). The treatment plan includes MammaPrint obtained from the right-sided tumor, surgery, adjuvant radiation on the right, possibly also on the left, and antiestrogen therapy.   CANCER HISTORY:  Oncology History  Malignant neoplasm of upper-outer quadrant of right breast in female, estrogen receptor positive ([place])  12/12/2020 Initial Diagnosis   Malignant neoplasm of upper-outer quadrant of right breast in female, estrogen receptor positive ([place])    12/17/2020 Cancer Staging   Staging form: Breast, AJCC 8th Edition - Clinical stage from 12/17/2020: cT2, cN1, cM0, ER+, PR+, HER2- - Signed by [name], MD on 12/17/2020  Stage prefix: Initial diagnosis  Laterality: Right  Staged by: Pathologist and managing physician  Stage used in treatment planning: Yes  National guidelines used in treatment planning: Yes  Type of national guideline used in  treatment planning: NCCN    Malignant neoplasm of upper-outer quadrant of left breast in female, estrogen receptor positive ([place])  12/12/2020 Initial Diagnosis   Malignant neoplasm of upper-outer quadrant of left breast in female, estrogen receptor positive ([place])    12/17/2020 Cancer Staging   Staging form: Breast, AJCC 8th Edition - Clinical stage from 12/17/2020: cT1b, cN0, cM0, ER+, PR+, HER2- - Signed by [name], MD on 12/17/2020  Stage prefix: Initial diagnosis  Laterality: Left  Staged by: Pathologist and managing physician  Stage used in treatment planning: Yes  National guidelines used in treatment planning: Yes  Type of national guideline used in treatment planning: NCCN       RISK FACTORS:  Menarche was at age 33.  First live birth at age 88.  OCP use for approximately 0 years.  Ovaries intact: yes.  Hysterectomy: yes.  Menopausal status: s/p hysterectomy.  HRT use: 0 years. Colonoscopy: yes;  11/07/2020 - one hyperplastic polyp . Mammogram within the last year: yes.  Past Medical History:  Diagnosis Date   Allergy    seasonal allergies   Anxiety    on meds   Asthma    uses inhaler   Breast cancer ([place]) 2012   RIGHT lumpectomy   Cancer ([place]) 2022   RIGHT breast lump-dx 2022   Depression    on meds   DVT (deep venous thrombosis) ([place]) 2010   after hysterectomy   Family history of uterine cancer    GERD (gastroesophageal reflux disease)    with certain foods/OTC PRN meds   Headache(784.0)    Hyperlipidemia    on meds  Hypertension    on meds    Past Surgical History:  Procedure Laterality Date   ABDOMINAL HYSTERECTOMY     BREAST EXCISIONAL BIOPSY Right 09/16/2009   BREAST SURGERY     lumpectomy   TUBAL LIGATION      Social History   Socioeconomic History   Marital status: Single    Spouse name: Not on file   Number of children: Not on file   Years of education: Not on file   Highest education level: Not on file   Occupational History   Not on file  Tobacco Use   Smoking status: Never   Smokeless tobacco: Never  Vaping Use   Vaping Use: Never used  Substance and Sexual Activity   Alcohol use: Yes    Alcohol/week: 7.0 standard drinks    Types: 7 Standard drinks or equivalent per week    Comment: occassional   Drug use: No   Sexual activity: Not Currently  Other Topics Concern   Not on file  Social History Narrative   Not on file   Social Determinants of Health   Financial Resource Strain: High Risk   Difficulty of Paying Living Expenses: Hard  Food Insecurity: Food Insecurity Present   Worried About [place] in the Last Year: Sometimes true   Ran Out of Food in the Last Year: Sometimes true  Transportation Needs: No Transportation Needs   Lack of Transportation (Medical): No   Lack of Transportation (Non-Medical): No  Physical Activity: Not on file  Stress: Not on file  Social Connections: Not on file     FAMILY HISTORY:  We obtained a detailed, 4-generation family history.  Significant diagnoses are listed below: Family History  Problem Relation Age of Onset   Cancer Mother        ?, dx <50   Hypertension Father    Cancer Maternal Aunt        uterine vs ovarian, dx <50   Cancer Maternal Aunt        unknown type, dx <50   Uterine cancer Cousin 60   Colon polyps Neg Hx    Colon cancer Neg Hx    Esophageal cancer Neg Hx    Stomach cancer Neg Hx    Chelsea Hansen has two daughters three sons (ages 23-31). She has one brother and one sister, both in their 6s. None of these relatives have had cancer.  Chelsea Hansen mother is alive at age 32 and has a history of cancer diagnosed younger than 63. There were nine maternal aunts and three maternal uncles. One aunt had either uterine or ovarian cancer younger than 70. Another aunt had an unknown cancer diagnosed younger than 64. There is no known cancer among maternal cousins. Chelsea Hansen maternal grandmother is alive at age 59  without cancer. Her maternal grandfather died around age 61 without cancer.   Chelsea Hansen father is alive at age 96 without cancer. There were three paternal aunts and nine paternal uncles. There is no known cancer among paternal aunts/uncles. One paternal cousin was diagnosed with uterine cancer recently at age 71. Chelsea Hansen paternal grandmother died at age 54 without cancer. Her paternal grandfather died around age 60 without cancer.  Chelsea Hansen is unaware of previous family history of genetic testing for hereditary cancer risks. Patient's ancestors are of unknown descent. There is no reported Ashkenazi Jewish ancestry. There is no known consanguinity.  GENETIC COUNSELING ASSESSMENT: Chelsea Hansen is a 50 y.o. female with  a personal and family history of cancer which is somewhat suggestive of a hereditary cancer syndrome and predisposition to cancer. We, therefore, discussed and recommended the following at today's visit.   DISCUSSION: We discussed that approximately 5-10% of breast cancer is hereditary, with most cases associated with the BRCA1 and BRCA2 genes. There are other genes that can be associated with hereditary breast cancer syndromes. These include ATM, CHEK2, PALB2, etc. We discussed that testing is beneficial for several reasons, including knowing about other cancer risks, identifying potential screening and risk-reduction options that may be appropriate, and to understand if other family members could be at risk for cancer and allow them to undergo genetic testing.   We reviewed the characteristics, features and inheritance patterns of hereditary cancer syndromes. We also discussed genetic testing, including the appropriate family members to test, the process of testing, insurance coverage and turn-around-time for results. We discussed the implications of a negative, positive and/or variant of uncertain significant result. In order to get genetic test results in a timely manner so that Chelsea Hansen  can use these genetic test results for surgical decisions, we recommended Chelsea Hansen pursue genetic testing for the [employer]. Once complete, we recommend Chelsea Hansen pursue reflex genetic testing to the CancerNext-Expanded + RNAinsight gene panel.   The BRCAplus panel offered by [employer] and includes sequencing and deletion/duplication analysis for the following 8 genes: ATM, BRCA1, BRCA2, CDH1, CHEK2, PALB2, PTEN, and TP53. The CancerNext-Expanded + RNAinsight gene panel offered by [employer] and includes sequencing and rearrangement analysis for the following 77 genes: AIP, ALK, APC, ATM, AXIN2, BAP1, BARD1, BLM, BMPR1A, BRCA1, BRCA2, BRIP1, CDC73, CDH1, CDK4, CDKN1B, CDKN2A, CHEK2, CTNNA1, DICER1, FANCC, FH, FLCN, GALNT12, KIF1B, LZTR1, MAX, MEN1, MET, MLH1, MSH2, MSH3, MSH6, MUTYH, NBN, NF1, NF2, NTHL1, PALB2, PHOX2B, PMS2, POT1, PRKAR1A, PTCH1, PTEN, RAD51C, RAD51D, RB1, RECQL, RET, SDHA, SDHAF2, SDHB, SDHC, SDHD, SMAD4, SMARCA4, SMARCB1, SMARCE1, STK11, SUFU, TMEM127, TP53, TSC1, TSC2, VHL and XRCC2 (sequencing and deletion/duplication); EGFR, EGLN1, HOXB13, KIT, MITF, PDGFRA, POLD1 and POLE (sequencing only); EPCAM and GREM1 (deletion/duplication only). RNA data is routinely analyzed for use in variant interpretation for all genes   Based on Chelsea Hansen's personal and family history of cancer, she meets medical criteria for genetic testing. Despite that she meets criteria, she may still have an out of pocket cost. Chelsea Hansen expressed concern regarding the cost of testing; therefore, we will request financial assistance from the genetic testing laboratory.  PLAN: After considering the risks, benefits, and limitations, Chelsea Hansen provided informed consent to pursue genetic testing and the blood sample was sent to [facility] for analysis of the BRCAplus + CancerNext-Expanded + RNAinsight panel. Results should be available within approximately one-two weeks' time, at which point  they will be disclosed by telephone to Chelsea Hansen, as will any additional recommendations warranted by these results. Chelsea Hansen will receive a summary of her genetic counseling visit and a copy of her results once available. This information will also be available in Epic.   Ms. Almendarez questions were answered to her satisfaction today. Our contact information was provided should additional questions or concerns arise. Thank you for the referral and allowing [place] to share in the care of your patient.   Clint Guy, [place], [facility] Licensed, Certified Dispensing [email or web address] Phone: [phone]  The patient was seen for a total of 12 minutes in face-to-face genetic counseling. Patient was seen with her daughter, [name]. This patient was discussed with Drs. Magrinat,  Lindi Adie and/or Burr Medico who agrees with the above.    _______________________________________________________________________ For Office Staff:  Number of people involved in session: 1 Was an Intern/ student involved with case: no

## 2020-12-18 ENCOUNTER — Other Ambulatory Visit: Payer: Self-pay | Admitting: *Deleted

## 2020-12-18 ENCOUNTER — Other Ambulatory Visit (HOSPITAL_COMMUNITY): Payer: Self-pay

## 2020-12-18 ENCOUNTER — Telehealth: Payer: Self-pay | Admitting: Oncology

## 2020-12-18 DIAGNOSIS — C50411 Malignant neoplasm of upper-outer quadrant of right female breast: Secondary | ICD-10-CM

## 2020-12-18 MED ORDER — LORAZEPAM 0.5 MG PO TABS
ORAL_TABLET | ORAL | 0 refills | Status: DC
  Filled 2020-12-18: qty 2, 1d supply, fill #0

## 2020-12-18 MED ORDER — LORAZEPAM 0.5 MG PO TABS: ORAL_TABLET | ORAL | 0 refills | Status: DC

## 2020-12-18 NOTE — Progress Notes (Signed)
[facility] 

## 2020-12-18 NOTE — Telephone Encounter (Signed)
Scheduled appointment per 06/29 los. Left message.

## 2020-12-19 ENCOUNTER — Encounter: Payer: Self-pay | Admitting: *Deleted

## 2020-12-19 ENCOUNTER — Telehealth: Payer: Self-pay | Admitting: *Deleted

## 2020-12-19 NOTE — Telephone Encounter (Signed)
Spoke with patient to follow up from [facility] 6/29 and assess navigation needs. Patient denies any questions at this time. Encouraged her to call should anything arise. Patient verbalized understanding.

## 2020-12-23 ENCOUNTER — Ambulatory Visit (HOSPITAL_COMMUNITY)
Admission: RE | Admit: 2020-12-23 | Discharge: 2020-12-23 | Disposition: A | Discharge status: Home / Self Care | Payer: Medicaid Other | Source: Ambulatory Visit | Attending: Surgery | Admitting: Surgery

## 2020-12-23 ENCOUNTER — Other Ambulatory Visit (HOSPITAL_COMMUNITY): Payer: Self-pay

## 2020-12-23 ENCOUNTER — Other Ambulatory Visit: Payer: Self-pay

## 2020-12-23 DIAGNOSIS — C50411 Malignant neoplasm of upper-outer quadrant of right female breast: Secondary | ICD-10-CM | POA: Diagnosis present

## 2020-12-23 DIAGNOSIS — C50412 Malignant neoplasm of upper-outer quadrant of left female breast: Secondary | ICD-10-CM | POA: Diagnosis present

## 2020-12-23 DIAGNOSIS — Z170 Estrogen receptor positive status [ER+]: Secondary | ICD-10-CM | POA: Diagnosis present

## 2020-12-23 MED ORDER — GADOBUTROL 1 MMOL/ML IV SOLN
10.0000 mL | Freq: Once | INTRAVENOUS | Status: AC | PRN
  Administered 2020-12-23: 10 mL via INTRAVENOUS

## 2020-12-24 DIAGNOSIS — Z1379 Encounter for other screening for genetic and chromosomal anomalies: Secondary | ICD-10-CM | POA: Insufficient documentation

## 2020-12-25 ENCOUNTER — Other Ambulatory Visit: Payer: Self-pay | Admitting: *Deleted

## 2020-12-25 ENCOUNTER — Encounter: Payer: Self-pay | Admitting: *Deleted

## 2020-12-25 ENCOUNTER — Encounter: Payer: Self-pay | Admitting: Genetic Counselor

## 2020-12-25 ENCOUNTER — Telehealth: Payer: Self-pay | Admitting: Genetic Counselor

## 2020-12-25 DIAGNOSIS — C50411 Malignant neoplasm of upper-outer quadrant of right female breast: Secondary | ICD-10-CM

## 2020-12-25 DIAGNOSIS — Z170 Estrogen receptor positive status [ER+]: Secondary | ICD-10-CM

## 2020-12-25 DIAGNOSIS — C50412 Malignant neoplasm of upper-outer quadrant of left female breast: Secondary | ICD-10-CM

## 2020-12-25 NOTE — Telephone Encounter (Signed)
Revealed negative genetic testing through the [facility] panel. Discussed that we do not know why she has breast cancer or why there is cancer in the family. It is possible that there could be a mutation in a different gene that we are not testing, or our current technology may not be able to detect certain mutations. It will therefore be important for her to stay in contact with genetics to keep up with whether additional testing may be appropriate in the future.  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 Her result is still considered normal at this time and should not impact her medical management.  Additional genetic testing through the [facility] CancerNext-Expanded + RNAinsight panel is pending. We will call Chelsea Hansen once these results are available.

## 2020-12-26 ENCOUNTER — Other Ambulatory Visit: Payer: Self-pay | Admitting: Surgery

## 2020-12-26 ENCOUNTER — Other Ambulatory Visit (HOSPITAL_COMMUNITY): Payer: Self-pay

## 2020-12-26 DIAGNOSIS — C50411 Malignant neoplasm of upper-outer quadrant of right female breast: Secondary | ICD-10-CM

## 2020-12-26 DIAGNOSIS — C50412 Malignant neoplasm of upper-outer quadrant of left female breast: Secondary | ICD-10-CM

## 2020-12-26 DIAGNOSIS — Z170 Estrogen receptor positive status [ER+]: Secondary | ICD-10-CM

## 2020-12-26 MED ORDER — DIAZEPAM 5 MG PO TABS
5.0000 mg | ORAL_TABLET | ORAL | 0 refills | Status: DC
  Filled 2020-12-26: qty 1, 1d supply, fill #0

## 2020-12-29 ENCOUNTER — Encounter: Payer: Self-pay | Admitting: *Deleted

## 2020-12-30 ENCOUNTER — Ambulatory Visit
Admission: RE | Admit: 2020-12-30 | Discharge: 2020-12-30 | Disposition: A | Discharge status: Home / Self Care | Payer: No Typology Code available for payment source | Source: Ambulatory Visit | Attending: Surgery | Admitting: Surgery

## 2020-12-30 ENCOUNTER — Other Ambulatory Visit (HOSPITAL_COMMUNITY): Payer: Self-pay

## 2020-12-30 ENCOUNTER — Other Ambulatory Visit: Payer: Self-pay

## 2020-12-30 DIAGNOSIS — Z170 Estrogen receptor positive status [ER+]: Secondary | ICD-10-CM

## 2020-12-30 DIAGNOSIS — C50412 Malignant neoplasm of upper-outer quadrant of left female breast: Secondary | ICD-10-CM

## 2020-12-30 DIAGNOSIS — C50411 Malignant neoplasm of upper-outer quadrant of right female breast: Secondary | ICD-10-CM

## 2020-12-30 MED ORDER — DOXYCYCLINE HYCLATE 100 MG PO TABS
ORAL_TABLET | ORAL | 0 refills | Status: DC
  Filled 2020-12-30: qty 14, 7d supply, fill #0

## 2020-12-31 ENCOUNTER — Other Ambulatory Visit: Payer: No Typology Code available for payment source

## 2020-12-31 ENCOUNTER — Inpatient Hospital Stay: Admission: RE | Admit: 2020-12-31 | Payer: No Typology Code available for payment source | Source: Ambulatory Visit

## 2021-01-01 ENCOUNTER — Ambulatory Visit: Payer: Self-pay | Admitting: Genetic Counselor

## 2021-01-01 ENCOUNTER — Encounter: Payer: Self-pay | Admitting: Genetic Counselor

## 2021-01-01 DIAGNOSIS — Z1379 Encounter for other screening for genetic and chromosomal anomalies: Secondary | ICD-10-CM

## 2021-01-01 NOTE — Telephone Encounter (Signed)
Revealed negative genetic testing through the [facility] CancerNext-Expanded + RNAinsight panel. No additional genetic variants were detected.

## 2021-01-01 NOTE — Progress Notes (Addendum)
HPI:  Chelsea Hansen was previously seen in the [place] clinic due to a personal and family history of cancer and concerns regarding a hereditary predisposition to cancer. Please refer to our prior cancer genetics clinic note for more information regarding our discussion, assessment and recommendations, at the time. Chelsea Hansen recent genetic test results were disclosed to her, as were recommendations warranted by these results. These results and recommendations are discussed in more detail below.  CANCER HISTORY:  Oncology History  Malignant neoplasm of upper-outer quadrant of right breast in female, estrogen receptor positive ([place])  12/12/2020 Initial Diagnosis   Malignant neoplasm of upper-outer quadrant of right breast in female, estrogen receptor positive ([place])    12/17/2020 Cancer Staging   Staging form: Breast, AJCC 8th Edition - Clinical stage from 12/17/2020: cT2, cN1, cM0, ER+, PR+, HER2- - Signed by [name], MD on 12/17/2020  Stage prefix: Initial diagnosis  Laterality: Right  Staged by: Pathologist and managing physician  Stage used in treatment planning: Yes  National guidelines used in treatment planning: Yes  Type of national guideline used in treatment planning: NCCN    12/24/2020 Genetic Testing   Negative genetic testing:  No pathogenic variants detected on the [facility] BRCAplus panel (report date 12/24/2020) or the CancerNext-Expanded + RNAinsight panel (report date 12/31/2020). 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   The BRCAplus panel offered by [employer] and includes sequencing and deletion/duplication analysis for the following 8 genes: ATM, BRCA1, BRCA2, CDH1, CHEK2, PALB2, PTEN, and TP53. The CancerNext-Expanded + RNAinsight gene panel offered by [employer] and includes sequencing and rearrangement analysis for the following 77 genes: AIP, ALK, APC, ATM, AXIN2, BAP1, BARD1, BLM,  BMPR1A, BRCA1, BRCA2, BRIP1, CDC73, CDH1, CDK4, CDKN1B, CDKN2A, CHEK2, CTNNA1, DICER1, FANCC, FH, FLCN, GALNT12, KIF1B, LZTR1, MAX, MEN1, MET, MLH1, MSH2, MSH3, MSH6, MUTYH, NBN, NF1, NF2, NTHL1, PALB2, PHOX2B, PMS2, POT1, PRKAR1A, PTCH1, PTEN, RAD51C, RAD51D, RB1, RECQL, RET, SDHA, SDHAF2, SDHB, SDHC, SDHD, SMAD4, SMARCA4, SMARCB1, SMARCE1, STK11, SUFU, TMEM127, TP53, TSC1, TSC2, VHL and XRCC2 (sequencing and deletion/duplication); EGFR, EGLN1, HOXB13, KIT, MITF, PDGFRA, POLD1 and POLE (sequencing only); EPCAM and GREM1 (deletion/duplication only). RNA data is routinely analyzed for use in variant interpretation for all genes.   Malignant neoplasm of upper-outer quadrant of left breast in female, estrogen receptor positive ([place])  12/12/2020 Initial Diagnosis   Malignant neoplasm of upper-outer quadrant of left breast in female, estrogen receptor positive ([place])    12/17/2020 Cancer Staging   Staging form: Breast, AJCC 8th Edition - Clinical stage from 12/17/2020: cT1b, cN0, cM0, ER+, PR+, HER2- - Signed by [name], MD on 12/17/2020  Stage prefix: Initial diagnosis  Laterality: Left  Staged by: Pathologist and managing physician  Stage used in treatment planning: Yes  National guidelines used in treatment planning: Yes  Type of national guideline used in treatment planning: NCCN    12/24/2020 Genetic Testing   Negative genetic testing:  No pathogenic variants detected on the [facility] BRCAplus panel (report date 12/24/2020) or the CancerNext-Expanded + RNAinsight panel (report date 12/31/2020). 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   The BRCAplus panel offered by [employer] and includes sequencing and deletion/duplication analysis for the following 8 genes: ATM, BRCA1, BRCA2, CDH1, CHEK2, PALB2, PTEN, and TP53. The CancerNext-Expanded + RNAinsight gene panel offered by [employer] and includes sequencing and rearrangement  analysis for the following  77 genes: AIP, ALK, APC, ATM, AXIN2, BAP1, BARD1, BLM, BMPR1A, BRCA1, BRCA2, BRIP1, CDC73, CDH1, CDK4, CDKN1B, CDKN2A, CHEK2, CTNNA1, DICER1, FANCC, FH, FLCN, GALNT12, KIF1B, LZTR1, MAX, MEN1, MET, MLH1, MSH2, MSH3, MSH6, MUTYH, NBN, NF1, NF2, NTHL1, PALB2, PHOX2B, PMS2, POT1, PRKAR1A, PTCH1, PTEN, RAD51C, RAD51D, RB1, RECQL, RET, SDHA, SDHAF2, SDHB, SDHC, SDHD, SMAD4, SMARCA4, SMARCB1, SMARCE1, STK11, SUFU, TMEM127, TP53, TSC1, TSC2, VHL and XRCC2 (sequencing and deletion/duplication); EGFR, EGLN1, HOXB13, KIT, MITF, PDGFRA, POLD1 and POLE (sequencing only); EPCAM and GREM1 (deletion/duplication only). RNA data is routinely analyzed for use in variant interpretation for all genes.     FAMILY HISTORY:  We obtained a detailed, 4-generation family history.  Significant diagnoses are listed below: Family History  Problem Relation Age of Onset   Cancer Mother        ?, dx <50   Hypertension Father    Cancer Maternal Aunt        uterine vs ovarian, dx <50   Cancer Maternal Aunt        unknown type, dx <50   Uterine cancer Cousin 29   Colon polyps Neg Hx    Colon cancer Neg Hx    Esophageal cancer Neg Hx    Stomach cancer Neg Hx    Chelsea Hansen has two daughters three sons (ages 80-31). She has one brother and one sister, both in their 46s. None of these relatives have had cancer.   Chelsea Hansen mother is alive at age 61 and has a history of cancer diagnosed younger than 72. There were nine maternal aunts and three maternal uncles. One aunt had either uterine or ovarian cancer younger than 74. Another aunt had an unknown cancer diagnosed younger than 37. There is no known cancer among maternal cousins. Chelsea Hansen maternal grandmother is alive at age 48 without cancer. Her maternal grandfather died around age 53 without cancer.   Chelsea Hansen father is alive at age 37 without cancer. There were three paternal aunts and nine paternal uncles. There is no known cancer among paternal  aunts/uncles. One paternal cousin was diagnosed with uterine cancer recently at age 48. Chelsea Hansen paternal grandmother died at age 38 without cancer. Her paternal grandfather died around age 48 without cancer.   Chelsea Hansen is unaware of previous family history of genetic testing for hereditary cancer risks. Patient's ancestors are of unknown descent. There is no reported Ashkenazi Jewish ancestry. There is no known consanguinity.  GENETIC TEST RESULTS: Genetic testing reported out on 12/24/2020 through the [facility] panel and 12/31/2020 through the [facility] + RNAinsight panel. No pathogenic variants were detected.   The BRCAplus panel offered by [employer] and includes sequencing and deletion/duplication analysis for the following 8 genes: ATM, BRCA1, BRCA2, CDH1, CHEK2, PALB2, PTEN, and TP53. The CancerNext-Expanded + RNAinsight gene panel offered by [employer] and includes sequencing and rearrangement analysis for the following 77 genes: AIP, ALK, APC, ATM, AXIN2, BAP1, BARD1, BLM, BMPR1A, BRCA1, BRCA2, BRIP1, CDC73, CDH1, CDK4, CDKN1B, CDKN2A, CHEK2, CTNNA1, DICER1, FANCC, FH, FLCN, GALNT12, KIF1B, LZTR1, MAX, MEN1, MET, MLH1, MSH2, MSH3, MSH6, MUTYH, NBN, NF1, NF2, NTHL1, PALB2, PHOX2B, PMS2, POT1, PRKAR1A, PTCH1, PTEN, RAD51C, RAD51D, RB1, RECQL, RET, SDHA, SDHAF2, SDHB, SDHC, SDHD, SMAD4, SMARCA4, SMARCB1, SMARCE1, STK11, SUFU, TMEM127, TP53, TSC1, TSC2, VHL and XRCC2 (sequencing and deletion/duplication); EGFR, EGLN1, HOXB13, KIT, MITF, PDGFRA, POLD1 and POLE (sequencing only); EPCAM and GREM1 (deletion/duplication only). RNA data is routinely analyzed for use in variant interpretation for all genes. The test  report will be scanned into EPIC and located under the Molecular Pathology section of the Results Review tab.  A portion of the result report is included below for reference.     We discussed with Chelsea Hansen that because current genetic testing is not perfect, it is  possible there may be a gene mutation in one of these genes that current testing cannot detect, but that chance is small.  We also discussed that there could be another gene that has not yet been discovered, or that we have not yet tested, that is responsible for the cancer diagnoses in the family. It is also possible there is a hereditary cause for the cancer in the family that Chelsea Hansen did not inherit and therefore was not identified in her testing.  Therefore, it is important to remain in touch with cancer genetics in the future so that we can continue to offer Chelsea Hansen the most up to date genetic testing.   Genetic testing did identify a variant of uncertain significance (VUS) in the ATM gene called p.D44G (c.131A>G).  At this time, it is unknown if this variant is associated with increased cancer risk or if this is a normal finding, but most variants such as this get reclassified to being inconsequential. It should not be used to make medical management decisions. With time, we suspect the lab will determine the significance of this variant, if any. If we do learn more about it, we will try to contact Chelsea Hansen to discuss it further. However, it is important to stay in touch with [place] periodically and keep the address and phone number up to date.  CANCER SCREENING RECOMMENDATIONS: Chelsea Hansen test result is considered negative (normal).  This means that we have not identified a hereditary cause for her personal and family history of cancer at this time. While reassuring, this does not definitively rule out a hereditary predisposition to cancer. It is still possible that there could be genetic mutations that are undetectable by current technology. There could be genetic mutations in genes that have not been tested or identified to increase cancer risk.  Therefore, it is recommended she continue to follow the cancer management and screening guidelines provided by her oncology and primary healthcare provider.    An individual's cancer risk and medical management are not determined by genetic test results alone. Overall cancer risk assessment incorporates additional factors, including personal medical history, family history, and any available genetic information that may result in a personalized plan for cancer prevention and surveillance.  RECOMMENDATIONS FOR FAMILY MEMBERS:  Individuals in this family might be at some increased risk of developing cancer, over the general population risk, simply due to the family history of cancer.  We recommended women in this family have a yearly mammogram beginning at age 8, or 3 years younger than the earliest onset of cancer, an annual clinical breast exam, and perform monthly breast self-exams. Women in this family should also have a gynecological exam as recommended by their primary provider. All family members should be referred for colonoscopy starting at age 54.  It is also possible there is a hereditary cause for the cancer in Chelsea Hansen's family that she did not inherit and therefore was not identified in her.  Based on Chelsea Hansen's family history, we recommended her maternal aunt, who was diagnosed with ovarian or uterine cancer younger than 57, have genetic counseling and testing. Chelsea Hansen will let us know if we can be of any assistance in  coordinating genetic counseling and/or testing for this family member.   FOLLOW-UP: Lastly, we discussed with Chelsea Hansen that cancer genetics is a rapidly advancing field and it is possible that new genetic tests will be appropriate for her and/or her family members in the future. We encouraged her to remain in contact with cancer genetics on an annual basis so we can update her personal and family histories and let her know of advances in cancer genetics that may benefit this family.   Our contact number was provided. Chelsea Hansen questions were answered to her satisfaction, and she knows she is welcome to call us at anytime with  additional questions or concerns.   Clint Guy, MS, [facility] Genetic Counselor [email or web address] Phone: [phone]

## 2021-01-02 ENCOUNTER — Encounter: Payer: Self-pay | Admitting: *Deleted

## 2021-01-05 ENCOUNTER — Ambulatory Visit: Payer: No Typology Code available for payment source

## 2021-01-05 ENCOUNTER — Telehealth: Payer: Self-pay | Admitting: *Deleted

## 2021-01-05 ENCOUNTER — Ambulatory Visit
Admission: RE | Admit: 2021-01-05 | Discharge: 2021-01-05 | Disposition: A | Discharge status: Home / Self Care | Payer: No Typology Code available for payment source | Source: Ambulatory Visit | Attending: Surgery | Admitting: Surgery

## 2021-01-05 ENCOUNTER — Encounter: Payer: Self-pay | Admitting: *Deleted

## 2021-01-05 ENCOUNTER — Other Ambulatory Visit: Payer: Self-pay

## 2021-01-05 ENCOUNTER — Other Ambulatory Visit: Payer: Self-pay | Admitting: Surgery

## 2021-01-05 DIAGNOSIS — C50411 Malignant neoplasm of upper-outer quadrant of right female breast: Secondary | ICD-10-CM

## 2021-01-05 DIAGNOSIS — C50412 Malignant neoplasm of upper-outer quadrant of left female breast: Secondary | ICD-10-CM

## 2021-01-05 DIAGNOSIS — Z170 Estrogen receptor positive status [ER+]: Secondary | ICD-10-CM

## 2021-01-05 NOTE — Telephone Encounter (Signed)
Received mammaprint results of HIGH RISK on core bx Physicain team notified. Called pt, scheduled and confirmed appt for 7/28 at 12:30 to discuss results.

## 2021-01-06 ENCOUNTER — Other Ambulatory Visit (HOSPITAL_COMMUNITY): Payer: Self-pay

## 2021-01-06 MED ORDER — DIAZEPAM 5 MG PO TABS
ORAL_TABLET | ORAL | 0 refills | Status: DC
  Filled 2021-01-06: qty 2, 10d supply, fill #0

## 2021-01-08 ENCOUNTER — Ambulatory Visit: Payer: No Typology Code available for payment source

## 2021-01-08 ENCOUNTER — Other Ambulatory Visit: Payer: Self-pay

## 2021-01-08 ENCOUNTER — Ambulatory Visit
Admission: RE | Admit: 2021-01-08 | Discharge: 2021-01-08 | Disposition: A | Discharge status: Home / Self Care | Payer: Self-pay | Source: Ambulatory Visit | Attending: Surgery | Admitting: Surgery

## 2021-01-08 DIAGNOSIS — Z170 Estrogen receptor positive status [ER+]: Secondary | ICD-10-CM

## 2021-01-08 DIAGNOSIS — C50412 Malignant neoplasm of upper-outer quadrant of left female breast: Secondary | ICD-10-CM

## 2021-01-08 DIAGNOSIS — C50411 Malignant neoplasm of upper-outer quadrant of right female breast: Secondary | ICD-10-CM

## 2021-01-09 ENCOUNTER — Encounter: Payer: Self-pay | Admitting: *Deleted

## 2021-01-12 ENCOUNTER — Other Ambulatory Visit: Payer: Self-pay | Admitting: Oncology

## 2021-01-13 ENCOUNTER — Encounter: Payer: Self-pay | Admitting: Oncology

## 2021-01-15 ENCOUNTER — Inpatient Hospital Stay: Payer: Medicaid Other | Attending: Oncology | Admitting: Oncology

## 2021-01-15 ENCOUNTER — Encounter: Payer: Self-pay | Admitting: *Deleted

## 2021-01-15 ENCOUNTER — Other Ambulatory Visit: Payer: Self-pay | Admitting: *Deleted

## 2021-01-15 ENCOUNTER — Other Ambulatory Visit: Payer: Self-pay

## 2021-01-15 VITALS — BP 149/89 | HR 64 | Temp 97.3°F | Resp 20 | Wt 221.1 lb

## 2021-01-15 DIAGNOSIS — Z170 Estrogen receptor positive status [ER+]: Secondary | ICD-10-CM | POA: Diagnosis not present

## 2021-01-15 DIAGNOSIS — C50411 Malignant neoplasm of upper-outer quadrant of right female breast: Secondary | ICD-10-CM | POA: Diagnosis not present

## 2021-01-15 DIAGNOSIS — C50412 Malignant neoplasm of upper-outer quadrant of left female breast: Secondary | ICD-10-CM | POA: Diagnosis not present

## 2021-01-15 DIAGNOSIS — C773 Secondary and unspecified malignant neoplasm of axilla and upper limb lymph nodes: Secondary | ICD-10-CM | POA: Insufficient documentation

## 2021-01-15 DIAGNOSIS — Z79899 Other long term (current) drug therapy: Secondary | ICD-10-CM | POA: Diagnosis not present

## 2021-01-15 NOTE — Progress Notes (Signed)
START ON PATHWAY REGIMEN - Breast     Cycles 1 through 4: A cycle is every 14 days:     Doxorubicin      Cyclophosphamide      Pegfilgrastim-[id number]    Cycles 5 through 16: A cycle is every 7 days:     Paclitaxel   **Always confirm dose/schedule in your pharmacy ordering system**  Patient Characteristics: Postoperative without Neoadjuvant Therapy (Pathologic Staging), Invasive Disease, Adjuvant Therapy, HER2 Negative/Unknown/Equivocal, ER Positive, Node Positive, Node Positive (4+) Therapeutic Status: Postoperative without Neoadjuvant Therapy (Pathologic Staging) AJCC Grade: GX AJCC N Category: pNX AJCC M Category: cM0 ER Status: Positive (+) AJCC 8 Stage Grouping: IIA HER2 Status: Negative (-) Oncotype Dx Recurrence Score: Ordered Other Genomic Test AJCC T Category: pTX PR Status: Positive (+) Adjuvant Therapy Status: No Adjuvant Therapy Received Yet or Changing Initial Adjuvant Regimen due to Tolerance Intent of Therapy: Curative Intent, Discussed with Patient

## 2021-01-15 NOTE — Progress Notes (Signed)
[name]  Telephone:([phone]) [phone] Fax:(336) [phone]     ID: Chelsea Hansen DOB: [birth year]  MR#: [id number]  XVQ#:[id number]  Patient Care Team: [name], NP as PCP - General (Internal Medicine) [name], RN as Oncology Nurse Navigator [name], RN as Oncology Nurse Navigator [name], MD as Consulting Physician (Radiation Oncology) Jemal Miskell, Virgie Dad, MD as Consulting Physician (Oncology) [name], MD as Consulting Physician (General Surgery) [name], MD OTHER MD:  CHIEF COMPLAINT: Bilateral estrogen receptor positive breast cancer  CURRENT TREATMENT: Awaiting definitive surgery versus neoadjuvant chemotherapy  INTERVAL HISTORY: Chelsea returns today for follow-up of her breast cancer accompanied by her son Chelsea Hansen.  Since her last visit here she had a breast MRI 12/23/2020 which showed the 3.9 cm upper outer right breast mass and right axillary lymph node previously biopsied, together with 2 other areas of concern spanning 0.8 cm and 0.9 cm.  Which have not yet been biopsied.  In addition there were 2 areas of concern in the left breast, 1 of which, measuring 0.9 cm was biopsied but a second area measuring 0.4cm was not biopsied.  We obtained MammaPrint from the larger tumor, on the right side, and it came back high risk.  She is here to discuss those results.  REVIEW OF SYSTEMS: [name] continues to work full-time.  Sometimes she works out all 7 days in a week.  Her hours can be variable.  She has a benign review of systems aside from of course feeling anxious and concerned about her new findings.    COVID 19 VACCINATION STATUS: [name] x2, most recently 10/2020, no booster as of June 2022   HISTORY OF CURRENT ILLNESS: LYLIE Hansen has a history of excisional biopsy of a fibroadenoma from the lower outer right breast in 2011.  More recently she noted a knot in her right breast.  She brought it to medical attention  and proceeded to bilateral diagnostic mammography with tomography and bilateral breast ultrasonography at [facility] on 11/18/2020 showing: breast density category B;   RIGHT: 3.2 cm mass associated with architectural distortion and scattered calcifications in upper-outer right breast at 10 o'clock, corresponding to palpable concern; indeterminate 1.1 cm mass in upper-outer right breast at 9:30; solitary pathologic right axillary lymph node with eccentric cortical thickening up to 7 mm. LEFT: 0.8 cm mass involving upper-outer left breast at 2 o'clock; no pathologic left axillary lymphadenopathy.  Accordingly on 12/03/2020 she proceeded to biopsy of the bilateral breast areas in question. The pathology from this procedure ([id number]) showed:  Right Breast, 9:30 fibroadenoma Right Breast, 10 o'clock Invasive and in situ mammary carcinoma, e-cadherin negative, grade 2 Prognostic indicators significant for: estrogen receptor, 95% positive and progesterone receptor, 100% positive, both with strong staining intensity. Proliferation marker Ki67 at 40%. HER2 negative by immunohistochemistry (0). 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 Grade or-2  Prognostic indicators significant for: estrogen receptor, 95% positive and progesterone receptor, 100% positive, both with strong staining intensity. Proliferation marker Ki67 at 15%. HER2 negative by immunohistochemistry (1+).  Cancer Staging Malignant neoplasm of upper-outer quadrant of left breast in female, estrogen receptor positive ([place]) Staging form: Breast, AJCC 8th Edition - Clinical stage from 12/17/2020: cT1b, cN0, cM0, ER+, PR+, HER2- - Signed by [name], MD on 12/17/2020 Stage prefix: Initial diagnosis Laterality:  Left Staged by: Pathologist and managing  physician Stage used in treatment planning: Yes National guidelines used in treatment planning: Yes Type of national guideline used in treatment planning: NCCN  Malignant neoplasm of upper-outer quadrant of right breast in female, estrogen receptor positive ([place]) Staging form: Breast, AJCC 8th Edition - Clinical stage from 12/17/2020: cT2, cN1, cM0, ER+, PR+, HER2- - Signed by [name], MD on 12/17/2020 Stage prefix: Initial diagnosis Laterality: Right Staged by: Pathologist and managing physician Stage used in treatment planning: Yes National guidelines used in treatment planning: Yes Type of national guideline used in treatment planning: NCCN  The patient's subsequent history is as detailed below.   PAST MEDICAL HISTORY: Past Medical History:  Diagnosis Date   Allergy    seasonal allergies   Anxiety    on meds   Asthma    uses inhaler   Breast cancer ([place]) 2012   RIGHT lumpectomy   Cancer ([place]) 2022   RIGHT breast lump-dx 2022   Depression    on meds   DVT (deep venous thrombosis) ([place]) 2010   after hysterectomy   Family history of uterine cancer    GERD (gastroesophageal reflux disease)    with certain foods/OTC PRN meds   Headache(784.0)    Hyperlipidemia    on meds   Hypertension    on meds    PAST SURGICAL HISTORY: Past Surgical History:  Procedure Laterality Date   ABDOMINAL HYSTERECTOMY     BREAST EXCISIONAL BIOPSY Right 09/16/2009   BREAST SURGERY     lumpectomy   TUBAL LIGATION    Patient's ovaries are still in place, status post hysterectomy 08/18/2010 with benign pathology  FAMILY HISTORY: Family History  Problem Relation Age of Onset   Cancer Mother        ?, dx <50   Hypertension Father    Cancer Maternal Aunt        uterine vs ovarian, dx <50   Cancer Maternal Aunt        unknown type, dx <50   Uterine cancer Cousin 24   Colon polyps Neg Hx    Colon cancer Neg Hx    Esophageal cancer Neg Hx    Stomach cancer Neg Hx    Her  father is age 75 and her mother age 40 as of 11/2020. Her mother has a history of cancer (type unsure).  The patient's mother had two sisters also with cancer, one with uterine and the other cancer type is unknown to the patient. Chelsea Hansen has 1 brother and 1 sister.    GYNECOLOGIC HISTORY:  No LMP recorded. Patient has had a hysterectomy. Menarche: 49 years old Age at first live birth: 50 years old [place] P 5 LMP 2012, with hysterectomy Contraceptive never used HRT never used  Hysterectomy? Yes, 07/2010 for fibroids (pathology in Epic) BSO? 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 She is single and lives at home with her three sons. Her two daughters also live here in [place]. Daughter Jassmin Kemmerer, age 71, works at [employer]. Daughter Jilda Panda Sample, age 49, and son Isiah Nils Pyle, age 30, work at [employer]. Son Sharlee Blew, age 7, and daughter Alvera Novel, age 61, are currently not employed. Sherrelle is not a Designer, fashion/clothing.    ADVANCED DIRECTIVES: not in place   HEALTH MAINTENANCE: Social History   Tobacco Use   Smoking status: Never   Smokeless tobacco: Never  Vaping Use   Vaping  Use: Never used  Substance Use Topics   Alcohol use: Yes    Alcohol/week: 7.0 standard drinks    Types: 7 Standard drinks or equivalent per week    Comment: occassional   Drug use: No     Colonoscopy: 10/2020 (Dr. Fuller Plan), recall 2027  PAP: 05/2010, negative, repeat not indicated (s/p hysterectomy)  Bone density: n/a (age)   No Known Allergies  Current Outpatient Medications  Medication Sig Dispense Refill   acetaminophen (TYLENOL) 500 MG tablet Take 2 tablets (1,000 mg total) by mouth every 6 (six) hours as needed. This is maximum dose of tylenol daily. Do not exceed this dose of tylenol from all sources (check other over the counter and prescribed medications) (Patient not taking: No sig reported) 30 tablet 0   ADVAIR HFA 115-21 MCG/ACT inhaler  Inhale 2 puffs into the lungs 2 (two) times daily. 1 each 2   albuterol (VENTOLIN HFA) 108 (90 Base) MCG/ACT inhaler Inhale 2 puffs into the lungs every 6 (six) hours as needed for wheezing or shortness of breath. 18 g 2   amLODipine (NORVASC) 10 MG tablet Take 1 tablet (10 mg total) by mouth daily. (Patient not taking: No sig reported) 90 tablet 1   atorvastatin (LIPITOR) 40 MG tablet Take 1 tablet (40 mg total) by mouth daily. (Patient not taking: Reported on 12/02/2020) 90 tablet 3   Cholecalciferol (VITAMIN D3) 2000 units capsule Take 2,000 Units by mouth daily.  11   diazepam (VALIUM) 5 MG tablet Take 1 tablet  by mouth every 12 hours as needed for anxiety for up to 10 days 2 tablet 0   doxycycline (VIBRA-TABS) 100 MG tablet Take 1 tablet by mouth 2 times a day 14 tablet 0   escitalopram (LEXAPRO) 20 MG tablet Take 1 tablet (20 mg total) by mouth daily. 90 tablet 1   fluticasone (FLONASE) 50 MCG/ACT nasal spray Place 2 sprays into both nostrils daily. (Patient not taking: No sig reported) 16 g 6   hydrochlorothiazide (HYDRODIURIL) 25 MG tablet Take 1 tablet (25 mg total) by mouth daily. 90 tablet 1   loratadine (CLARITIN) 10 MG tablet Take 1 tablet (10 mg total) by mouth daily. 30 tablet 11   LORazepam (ATIVAN) 0.5 MG tablet Take 1 tablet 1 hour prior to procedure. Then take 1 tablet right before procedure. 2 tablet 0   LORazepam (ATIVAN) 0.5 MG tablet Take 1 tablet (0.5 mg total) by mouth 1 hour prior to procedure and 1 tablet just before procedure 2 tablet 0   montelukast (SINGULAIR) 10 MG tablet Take 1 tablet (10 mg total) by mouth at bedtime. (Patient not taking: No sig reported) 90 tablet 3   No current facility-administered medications for this visit.    OBJECTIVE: African-American woman who appears stated age  50:   01/15/21 1236  BP: (!) 149/89  Pulse: 64  Resp: 20  Temp: (!) 97.3 F (36.3 C)  SpO2: 98%      Body mass index is 30.84 kg/m.   Wt Readings from Last 3  Encounters:  01/15/21 221 lb 1.6 oz (100.3 kg)  12/17/20 227 lb 3.2 oz (103.1 kg)  12/02/20 230 lb 4.8 oz (104.5 kg)      ECOG FS:1 - Symptomatic but completely ambulatory  Ocular: Sclerae unicteric, pupils round and equal Ear-nose-throat: Wearing a mask Lymphatic: No cervical or supraclavicular adenopathy Lungs no rales or rhonchi Heart regular rate and rhythm Abd soft, nontender, positive bowel sounds MSK no focal spinal tenderness, no  joint edema Neuro: non-focal, well-oriented, appropriate affect Breasts: The mass in the right breast upper outer quadrant is easily palpable and movable but does not affect skin or nipple.  I do not palpate any other masses or any masses in the right axilla.  I do not palpate any masses in the left breast or left axilla.  LAB RESULTS:  CMP     Component Value Date/Time   NA 140 12/17/2020 0831   NA 139 10/06/2020 1432   K 3.4 (L) 12/17/2020 0831   CL 106 12/17/2020 0831   CO2 24 12/17/2020 0831   GLUCOSE 118 (H) 12/17/2020 0831   BUN 12 12/17/2020 0831   BUN 11 10/06/2020 1432   CREATININE 0.91 12/17/2020 0831   CALCIUM 9.2 12/17/2020 0831   PROT 7.4 12/17/2020 0831   PROT 6.9 10/06/2020 1432   ALBUMIN 3.4 (L) 12/17/2020 0831   ALBUMIN 4.1 10/06/2020 1432   AST 15 12/17/2020 0831   ALT 18 12/17/2020 0831   ALKPHOS 125 12/17/2020 0831   BILITOT 0.3 12/17/2020 0831   GFRNONAA >60 12/17/2020 0831   GFRAA >60 09/07/2019 1340    Lab Results  Component Value Date   TOTALPROTELP 7.0 12/31/2011   ALBUMINELP 56.4 12/31/2011   A1GS 4.3 12/31/2011   A2GS 10.4 12/31/2011   BETS 6.4 12/31/2011   BETA2SER 4.6 12/31/2011   GAMS 17.9 12/31/2011   MSPIKE NOT DETECTED 12/31/2011   SPEI (NOTE) 12/31/2011    Lab Results  Component Value Date   WBC 7.8 12/17/2020   NEUTROABS 4.4 12/17/2020   HGB 10.9 (L) 12/17/2020   HCT 35.1 (L) 12/17/2020   MCV 73.9 (L) 12/17/2020   PLT 350 12/17/2020    No results found for: LABCA2  No components  found for: ERDEYC144  No results for input(s): INR in the last 168 hours.  No results found for: LABCA2  No results found for: YJE563  No results found for: JSH702  No results found for: OVZ858  No results found for: CA2729  No components found for: HGQUANT  No results found for: CEA1 / No results found for: CEA1   No results found for: AFPTUMOR  No results found for: [facility]  Lab Results  Component Value Date   KAPLAMBRATIO 10.08 12/31/2011   (kappa/lambda light chains)  No results found for: HGBA, HGBA2QUANT, HGBFQUANT, HGBSQUAN (Hemoglobinopathy evaluation)   Lab Results  Component Value Date   LDH 194 12/31/2011    Lab Results  Component Value Date   IRON 95 12/31/2011   TIBC 325 12/31/2011   IRONPCTSAT 29 12/31/2011   (Iron and TIBC)  Lab Results  Component Value Date   FERRITIN 37 12/31/2011    Urinalysis    Component Value Date/Time   COLORURINE YELLOW 09/07/2019 1341   APPEARANCEUR HAZY (A) 09/07/2019 1341   LABSPEC 1.021 09/07/2019 1341   PHURINE 9.0 (H) 09/07/2019 1341   GLUCOSEU NEGATIVE 09/07/2019 1341   HGBUR NEGATIVE 09/07/2019 1341   BILIRUBINUR NEGATIVE 09/07/2019 1341   KETONESUR NEGATIVE 09/07/2019 1341   PROTEINUR 30 (A) 09/07/2019 1341   UROBILINOGEN 0.2 11/01/2013 1246   NITRITE NEGATIVE 09/07/2019 1341   LEUKOCYTESUR NEGATIVE 09/07/2019 1341     STUDIES: MR BREAST BILATERAL W WO CONTRAST INC CAD  Result Date: 12/24/2020 CLINICAL DATA:  Breast cancer staging. Newly diagnosed right breast grade 2 invasive mammary carcinoma and a right metastatic lymph node. Newly diagnosed left breast invasive ductal carcinoma. LABS:  None. EXAM: BILATERAL BREAST MRI WITH AND WITHOUT CONTRAST  TECHNIQUE: Multiplanar, multisequence MR images of both breasts were obtained prior to and following the intravenous administration of 10 ml of Gadavist Three-dimensional MR images were rendered by post-processing of the original MR data on an  independent workstation. The three-dimensional MR images were interpreted, and findings are reported in the following complete MRI report for this study. Three dimensional images were evaluated at the independent interpreting workstation using the DynaCAD thin client. COMPARISON:  None. FINDINGS: Breast composition: b. Scattered fibroglandular tissue. Background parenchymal enhancement: Mild Right breast: There is a T2 bright subdermal lesion in the low right axilla with rim enhancement measuring 0.9 cm (series 3, image 17), favored to represent a sebaceous cyst. There is susceptibility artifact in the upper outer right breast anterior/middle depth consistent with a biopsy marking clip. There is an associated irregular enhancing mass with spiculated margins measuring approximately 3.9 x 3.0 x 3.5 cm (series 7, image 77), consistent with biopsy proven malignancy. There is additional clumped non mass enhancement that extends medially from the dominant mass spanning is 2.5 cm (series 7, image 70). There is linear non mass enhancement in the upper inner right breast spanning 0.8 cm (series 11, image 68). There is a second focus of susceptibility artifact in the upper outer right breast posterior depth consistent with a biopsy marking clip. There is an associated oval circumscribed mass with dark internal septation measuring 0.9 cm consistent with biopsy proven fibroadenoma. Left breast: There is susceptibility artifact in the upper outer left breast posterior depth consistent with a biopsy marking clip. There is an associated irregular enhancing mass measuring 0.9 x 0.8 x 0.9 cm (series 11, image 64), consistent with biopsy proven malignancy. There is a focus of enhancement in the medial aspect of the breast measuring 0.4 cm (series 11, image 72). Lymph nodes: There is a normal lymph node with cortical thickness measuring up to 1.0 cm in the right axilla with adjacent biopsy marking clip (series 2, image 26). There is  one additional level 1 axillary lymph node just superior to this with 0.5 cm eccentric cortical bulge, indeterminate (series 2, image 18). Normal appearance of the left axillary lymph nodes. Ancillary findings:  None. IMPRESSION: 1. Biopsy proven malignancy in the upper-outer RIGHT breast measuring 3.9 x 3.0 x 3.5 cm with additional clumped non mass enhancement that extends medially from the dominant mass spanning 2.5 cm. 2. Additional suspicious linear non mass enhancement in the upper inner RIGHT breast spanning 0.8 cm. 3. Small superficial rim enhancing mass in the low RIGHT axilla measuring 0.9 cm which is indeterminate but favored to represent a sebaceous cyst. 4. Abnormal biopsy-proven metastatic RIGHT axillary lymph node. There is 1 additional borderline lymph node with mild focal cortical bulge. 5. 0.9 cm enhancing mass in the upper outer LEFT breast consistent with biopsy proven malignancy. 6. Indeterminate 0.4 cm focus of enhancement in the medial aspect of the LEFT breast. 7. No LEFT axillary adenopathy. RECOMMENDATION: 1. Second-look ultrasound of the RIGHT breast to confirm suspected sebaceous cyst. 2. If RIGHT breast conservation is desired recommend MRI guided biopsy of the non mass enhancement in the upper inner RIGHT breast. 3. If LEFT breast conservation is desired recommend MRI guided biopsy of the 0.4 cm focus of enhancement in the medial aspect of the breast. BI-RADS CATEGORY  4: Suspicious. Electronically Signed   By: Audie Pinto M.D.   On: 12/24/2020 08:54  US BREAST LTD UNI RIGHT INC AXILLA  Result Date: 12/30/2020 CLINICAL DATA:  Patient returns after MRI  for evaluation of the RIGHT axilla. Recent diagnosis of RIGHT breast grade 2 invasive mammary carcinoma and metastatic RIGHT axillary lymph node and new diagnosis of LEFT breast invasive ductal carcinoma. Biopsies were performed on 12/03/2020. MRI was performed on 12/23/2020, showing a rim enhancing T2 bright lesion in the RIGHT  axilla for which ultrasound was recommended. Patient is also scheduled for bilateral MRI biopsies on 12/31/2020. Today patient complains of pain and swelling in the RIGHT axilla. She reports starting yesterday, a copious amount of yellow cloudy fluid is leaking from the biopsy site in the RIGHT axilla. EXAM: ULTRASOUND OF THE RIGHT AXILLA COMPARISON:  Previous exam(s). FINDINGS: On physical exam, there is a 1 centimeter erythematous site of leaking in the UPPER RIGHT axilla, at the biopsy site per patient. In this region, patient is tender during exam. No fluid is expressed from this lesion during exam. Targeted ultrasound is performed, showing at the site of patient's concern, there is focal hyperechoic tissue. There is a small heterogeneous fluid collection at the site which measures 2.3 x 0.8 x 1.7 centimeters. Area is vascular on Doppler evaluation. No other abnormalities are identified within the RIGHT axilla to correlate with the MRI finding. IMPRESSION: The MRI finding and the area of patient's concern today represent a small abscess at the biopsy site. There is no drainable fluid within the collection at this time. RECOMMENDATION: Recommend course of antibiotics. Patient was given doxycycline 100 milligrams twice a day for 7 days. Patient is scheduled for bilateral MR guided core biopsy tomorrow. I have discussed the findings and recommendations with the patient. If applicable, a reminder letter will be sent to the patient regarding the next appointment. BI-RADS CATEGORY  2: Benign. Electronically Signed   By: Nolon Nations M.D.   On: 12/30/2020 15:33    ELIGIBLE FOR AVAILABLE RESEARCH PROTOCOL: no  ASSESSMENT: 50 y.o. [place] woman status post bilateral breast biopsies 12/03/2020, showing  (1) on the right, a clinical T2 N1, stage IIa invasive lobular carcinoma, grade 2, estrogen and progesterone receptor strongly positive, HER2 not amplified, with an MIB-1 of 40%   (a) the biopsied right  axillary lymph node was positive with extracapsular extension   (b) a second right breast mass also biopsied was a fibroadenoma, concordant   (2) on the left, a clinical T1b N0, stage IA invasive ductal carcinoma, grade 1 or 2, estrogen and progesterone receptor positive, HER2 not amplified, with an MIB 1 of 15%  (3) genetics testing through the [facility] BRCAplus panel (report date 12/24/2020) or the CancerNext-Expanded + RNAinsight panel (report date 12/31/2020) found no deleterious mutations in ATM, BRCA1, BRCA2, CDH1, CHEK2, PALB2, PTEN, and TP53. The CancerNext-Expanded + RNAinsight gene panel offered by [employer] and includes sequencing and rearrangement analysis for the following 77 genes: AIP, ALK, APC, ATM, AXIN2, BAP1, BARD1, BLM, BMPR1A, BRCA1, BRCA2, BRIP1, CDC73, CDH1, CDK4, CDKN1B, CDKN2A, CHEK2, CTNNA1, DICER1, FANCC, FH, FLCN, GALNT12, KIF1B, LZTR1, MAX, MEN1, MET, MLH1, MSH2, MSH3, MSH6, MUTYH, NBN, NF1, NF2, NTHL1, PALB2, PHOX2B, PMS2, POT1, PRKAR1A, PTCH1, PTEN, RAD51C, RAD51D, RB1, RECQL, RET, SDHA, SDHAF2, SDHB, SDHC, SDHD, SMAD4, SMARCA4, SMARCB1, SMARCE1, STK11, SUFU, TMEM127, TP53, TSC1, TSC2, VHL and XRCC2 (sequencing and deletion/duplication); EGFR, EGLN1, HOXB13, KIT, MITF, PDGFRA, POLD1 and POLE (sequencing only); EPCAM and GREM1 (deletion/duplication only). RNA data is routinely analyzed for use in variant interpretation for all genes.  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   (4) MammaPrint obtained from  the right-sided tumor returned a high risk, luminal B type tumor, indicating a significant benefit from chemotherapy.  (5) definitive surgery pending  (6) adjuvant chemotherapy will consist of doxorubicin and cyclophosphamide in dose dense fashion x4 followed by weekly paclitaxel x12.  (7) adjuvant radiation on the right, possibly also on the left  (8) antiestrogens  PLAN: I discussed the results of  MammaPrint today with Lequita and her son.  She understands that if she wishes to have the best prognosis overall and the highest chance of cure she will need chemotherapy.  Many patients will not need chemotherapy to have a good prognosis and that is why we sent the MammaPrint study.  However in her case she will need chemo if she wishes to have a better than 90% chance of "cure".  After much discussion she was able to accept this and so the plan will be for chemotherapy.  We discussed the fact that she can have chemotherapy for or after surgery.  This does not affect survival.  She has at least 3 lesions 2 on the right and 1 on the left that have not yet been biopsied.  As of today she would prefer to keep her breast which of course is fine.  She will be meeting with Dr.Blackman in the near future.  At that point if she decides to keep her breast she will be set up for those biopsies and likely proceed to neoadjuvant chemotherapy to facilitate the definitive surgery.  If she decides to forego biopsies and simply have a right mastectomy or perhaps even bilateral mastectomies, then the chemotherapy could be done adjuvantly.  We discussed the possible toxicities side effects and complications of the planned chemo and she will come to meet with our chemotherapy teaching nurse next weeks for more information  After she meets with Dr. Ninfa Linden he will let us know whether we should be starting the chemotherapy before or after surgery.  In any case she will need to have a port placed  Total encounter time 45 minutes.Sarajane Jews C. Friedrich Harriott, MD 01/15/2021 6:01 PM Medical Oncology and Hematology [facility] [place], [place] [address] Tel. [phone]    Fax. [phone]   This document serves as a record of services personally performed by Lurline Del, MD. It was created on his behalf by Wilburn Mylar, a trained medical scribe. The creation of this record is based on  the scribe's personal observations and the provider's statements to them.   I, Lurline Del MD, have reviewed the above documentation for accuracy and completeness, and I agree with the above.    *Total Encounter Time as defined by the Centers for Medicare and Medicaid Services includes, in addition to the face-to-face time of a patient visit (documented in the note above) non-face-to-face time: obtaining and reviewing outside history, ordering and reviewing medications, tests or procedures, care coordination (communications with other health care professionals or caregivers) and documentation in the medical record.

## 2021-01-16 ENCOUNTER — Telehealth: Payer: Self-pay | Admitting: Oncology

## 2021-01-16 ENCOUNTER — Encounter: Payer: Self-pay | Admitting: *Deleted

## 2021-01-16 ENCOUNTER — Other Ambulatory Visit (HOSPITAL_COMMUNITY): Payer: Self-pay

## 2021-01-16 ENCOUNTER — Encounter: Payer: Self-pay | Admitting: Oncology

## 2021-01-16 ENCOUNTER — Other Ambulatory Visit: Payer: Self-pay | Admitting: Surgery

## 2021-01-16 MED ORDER — DIAZEPAM 5 MG PO TABS
ORAL_TABLET | ORAL | 0 refills | Status: DC
  Filled 2021-01-16: qty 2, 1d supply, fill #0

## 2021-01-16 MED ORDER — LORAZEPAM 1 MG PO TABS
ORAL_TABLET | ORAL | 0 refills | Status: DC
  Filled 2021-01-16: qty 2, 2d supply, fill #0

## 2021-01-16 NOTE — Telephone Encounter (Signed)
Spoke to patient to give updated appointment schedule for echo and chemo education, patient understands arrival times and locations

## 2021-01-16 NOTE — Telephone Encounter (Signed)
Scheduled appointment per 07/28 los. Patient will receive updated calender.

## 2021-01-19 ENCOUNTER — Encounter: Payer: Self-pay | Admitting: *Deleted

## 2021-01-19 ENCOUNTER — Telehealth: Payer: Self-pay | Admitting: Licensed Clinical Social Worker

## 2021-01-19 ENCOUNTER — Telehealth: Payer: Self-pay

## 2021-01-19 NOTE — Telephone Encounter (Signed)
TC from patient asking about short term disability. She is not sure if she has this available through her provider. CSW provided education on various disability (short term, long term, social security disability). Pt will check with her employer if she has short or long term disability plans. If she does not, we can refer to servant center to apply for social security disability, although this is a very long process and not guaranteed that she would be approved.    CSW also reviewed applying for assistance through cancer foundations. Pt has the paperwork for them and will work on gathering supporting documents to bring to this CSW.   Christeen Douglas, LCSW

## 2021-01-19 NOTE — Telephone Encounter (Signed)
Pt called to ask about longevity of chemo tx coming up, and asked if she would need to file for FMLA through her work. I advised pt to complete paperwork and send it to [place] for [place] to fill out in case she would need FMLA. Pt verbalized thanks and understanding.

## 2021-01-20 NOTE — Progress Notes (Signed)
Pharmacist Chemotherapy Monitoring - Initial Assessment    Anticipated start date: 01/27/21   The following has been reviewed per standard work regarding the patient's treatment regimen: The patient's diagnosis, treatment plan and drug doses, and organ/hematologic function Lab orders and baseline tests specific to treatment regimen  The treatment plan start date, drug sequencing, and pre-medications Prior authorization status  Patient's documented medication list, including drug-drug interaction screen and prescriptions for anti-emetics and supportive care specific to the treatment regimen The drug concentrations, fluid compatibility, administration routes, and timing of the medications to be used The patient's access for treatment and lifetime cumulative dose history, if applicable  The patient's medication allergies and previous infusion related reactions, if applicable   Changes made to treatment plan:  N/A  Follow up needed:  Pending authorization for treatment    Kasandra Fehr D, RPH, 01/20/2021  11:13 AM

## 2021-01-21 ENCOUNTER — Other Ambulatory Visit: Payer: Self-pay | Admitting: Oncology

## 2021-01-21 DIAGNOSIS — Z170 Estrogen receptor positive status [ER+]: Secondary | ICD-10-CM

## 2021-01-21 DIAGNOSIS — C50411 Malignant neoplasm of upper-outer quadrant of right female breast: Secondary | ICD-10-CM

## 2021-01-21 MED ORDER — DEXAMETHASONE 4 MG PO TABS
8.0000 mg | ORAL_TABLET | Freq: Every day | ORAL | 1 refills | Status: DC
  Filled 2021-01-23: qty 30, 15d supply, fill #0

## 2021-01-21 MED ORDER — LIDOCAINE-PRILOCAINE 2.5-2.5 % EX CREA
TOPICAL_CREAM | CUTANEOUS | 3 refills | Status: DC
  Filled 2021-01-23: qty 30, 30d supply, fill #0

## 2021-01-21 MED ORDER — PROCHLORPERAZINE MALEATE 10 MG PO TABS
10.0000 mg | ORAL_TABLET | Freq: Four times a day (QID) | ORAL | 1 refills | Status: DC | PRN
  Filled 2021-01-23: qty 30, 8d supply, fill #0

## 2021-01-21 NOTE — Patient Instructions (Addendum)
DUE TO COVID-19 ONLY ONE VISITOR IS ALLOWED TO COME WITH YOU AND STAY IN THE WAITING ROOM ONLY DURING PRE OP AND PROCEDURE.   **NO VISITORS ARE ALLOWED IN THE SHORT STAY AREA OR RECOVERY ROOM!!**       Your procedure is scheduled on: 01/26/21   Report to [facility] Main  Entrance    Report to admitting at 7:00 AM   Call this number if you have problems the morning of surgery [phone]   Do not eat food :After Midnight.   May have liquids until  6:00 AM day of surgery  CLEAR LIQUID DIET  Foods Allowed                                                                     Foods Excluded  Water, Black Coffee and tea, regular and decaf               liquids that you cannot  Plain Jell-O in any flavor  (No red)                                    see through such as: Fruit ices (not with fruit pulp)                                            milk, soups, orange juice              Iced Popsicles (No red)                                                All solid food                                   Apple juices Sports drinks like Gatorade (No red) Lightly seasoned clear broth or consume(fat free) Sugar, honey syrup   Oral Hygiene is also important to reduce your risk of infection.                                    Remember - BRUSH YOUR TEETH THE MORNING OF SURGERY WITH YOUR REGULAR TOOTHPASTE   Take these medicines the morning of surgery with A SIP OF WATER: Inhalers, Lexapro, Claritin, valium.     Ask your doctor about Bayer before surgery.                              You may not have any metal on your body including hair pins, jewelry, and body piercing             Do not wear make-up, lotions, powders, perfumes, or deodorant  Do not wear nail polish including gel and S&S, artificial/acrylic nails, or any other type of  covering on natural nails including finger and toenails. If you have artificial nails, gel coating, etc. that needs to be removed by a nail salon please  have this removed prior to surgery or surgery may need to be canceled/ delayed if the surgeon/ anesthesia feels like they are unable to be safely monitored.   Do not shave  48 hours prior to surgery.               Do not bring valuables to the hospital. [place].    Patients discharged the day of surgery will not be allowed to drive home.  Special Instructions: Bring a copy of your healthcare power of attorney and living will documents         the day of surgery if you haven't scanned them in before.   Please read over the following fact sheets you were given: IF YOU HAVE QUESTIONS ABOUT YOUR PRE OP INSTRUCTIONS PLEASE CALL [phone]- [place] - Preparing for Surgery Before surgery, you can play an important role.  Because skin is not sterile, your skin needs to be as free of germs as possible.  You can reduce the number of germs on your skin by washing with CHG (chlorahexidine gluconate) soap before surgery.  CHG is an antiseptic cleaner which kills germs and bonds with the skin to continue killing germs even after washing. Please DO NOT use if you have an allergy to CHG or antibacterial soaps.  If your skin becomes reddened/irritated stop using the CHG and inform your nurse when you arrive at Short Stay. Do not shave (including legs and underarms) for at least 48 hours prior to the first CHG shower.  You may shave your face/neck.  Please follow these instructions carefully:  1.  Shower with CHG Soap the night before surgery and the  morning of surgery.  2.  If you choose to wash your hair, wash your hair first as usual with your normal  shampoo.  3.  After you shampoo, rinse your hair and body thoroughly to remove the shampoo.                             4.  Use CHG as you would any other liquid soap.  You can apply chg directly to the skin and wash.  Gently with a scrungie or clean washcloth.  5.  Apply the CHG Soap to your  body ONLY FROM THE NECK DOWN.   Do   not use on face/ open                           Wound or open sores. Avoid contact with eyes, ears mouth and   genitals (private parts).                       Wash face,  Genitals (private parts) with your normal soap.             6.  Wash thoroughly, paying special attention to the area where your    surgery  will be performed.  7.  Thoroughly rinse your body with warm water from the neck down.  8.  DO NOT shower/wash with your normal soap after using and rinsing off the CHG Soap.  9.  Pat yourself dry with a clean towel.            10.  Wear clean pajamas.            11.  Place clean sheets on your bed the night of your first shower and do not  sleep with pets. Day of Surgery : Do not apply any lotions/deodorants the morning of surgery.  Please wear clean clothes to the hospital/surgery center.  FAILURE TO FOLLOW THESE INSTRUCTIONS MAY RESULT IN THE CANCELLATION OF YOUR SURGERY  PATIENT SIGNATURE_________________________________  NURSE SIGNATURE__________________________________  ________________________________________________________________________

## 2021-01-21 NOTE — Progress Notes (Addendum)
COVID Vaccine Completed: yes x2 Date COVID Vaccine completed: 10/14/20, 11/04/20 Has received booster: COVID vaccine manufacturer: [employer]     Date of COVID positive in last 90 days: No  PCP - Juluis Mire, NP Cardiologist -   Chest x-ray - 05/28/20 Epic EKG - 01/22/21 Epic/chart Stress Test - N/a ECHO - 01/22/21 Epic Cardiac Cath - N/a Pacemaker/ICD device last checked: N/a Spinal Cord Stimulator: N/a   Sleep Study - N/a CPAP -   Fasting Blood Sugar - N/a Checks Blood Sugar _____ times a day  Blood Thinner Instructions: N/a Aspirin Instructions: Last Dose:  Activity level: Can go up a flight of stairs and perform activities of daily living without stopping and without symptoms of chest pain or shortness of breath. Patient does have asthma.    Anesthesia review: HTN, DVT, asthma  Patient denies shortness of breath, fever, cough and chest pain at PAT appointment   Patient verbalized understanding of instructions that were given to them at the PAT appointment. Patient was also instructed that they will need to review over the PAT instructions again at home before surgery.

## 2021-01-22 ENCOUNTER — Inpatient Hospital Stay: Payer: Medicaid Other | Attending: Oncology

## 2021-01-22 ENCOUNTER — Other Ambulatory Visit (HOSPITAL_COMMUNITY): Payer: Self-pay

## 2021-01-22 ENCOUNTER — Telehealth: Payer: Self-pay | Admitting: Oncology

## 2021-01-22 ENCOUNTER — Ambulatory Visit (HOSPITAL_COMMUNITY)
Admission: RE | Admit: 2021-01-22 | Discharge: 2021-01-22 | Disposition: A | Discharge status: Home / Self Care | Payer: Medicaid Other | Source: Ambulatory Visit | Attending: Oncology | Admitting: Oncology

## 2021-01-22 ENCOUNTER — Encounter (HOSPITAL_COMMUNITY)
Admission: RE | Admit: 2021-01-22 | Discharge: 2021-01-22 | Disposition: A | Discharge status: Home / Self Care | Payer: Medicaid Other | Source: Ambulatory Visit | Attending: Surgery | Admitting: Surgery

## 2021-01-22 ENCOUNTER — Other Ambulatory Visit: Payer: Self-pay

## 2021-01-22 ENCOUNTER — Encounter (HOSPITAL_COMMUNITY): Payer: Self-pay

## 2021-01-22 DIAGNOSIS — Z0189 Encounter for other specified special examinations: Secondary | ICD-10-CM | POA: Diagnosis not present

## 2021-01-22 DIAGNOSIS — Z01818 Encounter for other preprocedural examination: Secondary | ICD-10-CM | POA: Diagnosis present

## 2021-01-22 DIAGNOSIS — Z5189 Encounter for other specified aftercare: Secondary | ICD-10-CM | POA: Insufficient documentation

## 2021-01-22 DIAGNOSIS — C50412 Malignant neoplasm of upper-outer quadrant of left female breast: Secondary | ICD-10-CM | POA: Insufficient documentation

## 2021-01-22 DIAGNOSIS — I358 Other nonrheumatic aortic valve disorders: Secondary | ICD-10-CM | POA: Insufficient documentation

## 2021-01-22 DIAGNOSIS — Z170 Estrogen receptor positive status [ER+]: Secondary | ICD-10-CM | POA: Insufficient documentation

## 2021-01-22 DIAGNOSIS — Z5111 Encounter for antineoplastic chemotherapy: Secondary | ICD-10-CM | POA: Insufficient documentation

## 2021-01-22 DIAGNOSIS — C50411 Malignant neoplasm of upper-outer quadrant of right female breast: Secondary | ICD-10-CM | POA: Insufficient documentation

## 2021-01-22 DIAGNOSIS — R112 Nausea with vomiting, unspecified: Secondary | ICD-10-CM | POA: Insufficient documentation

## 2021-01-22 DIAGNOSIS — K1230 Oral mucositis (ulcerative), unspecified: Secondary | ICD-10-CM | POA: Insufficient documentation

## 2021-01-22 LAB — CBC
HCT: 34.3 % — ABNORMAL LOW (ref 36.0–46.0)
Hemoglobin: 10.6 g/dL — ABNORMAL LOW (ref 12.0–15.0)
MCH: 23.2 pg — ABNORMAL LOW (ref 26.0–34.0)
MCHC: 30.9 g/dL (ref 30.0–36.0)
MCV: 75.1 fL — ABNORMAL LOW (ref 80.0–100.0)
Platelets: 321 10*3/uL (ref 150–400)
RBC: 4.57 MIL/uL (ref 3.87–5.11)
RDW: 17.4 % — ABNORMAL HIGH (ref 11.5–15.5)
WBC: 9.3 10*3/uL (ref 4.0–10.5)
nRBC: 0 % (ref 0.0–0.2)

## 2021-01-22 LAB — ECHOCARDIOGRAM COMPLETE
Area-P 1/2: 3.16 cm2
Height: 71 in
S' Lateral: 3.3 cm

## 2021-01-22 LAB — BASIC METABOLIC PANEL
Anion gap: 10 (ref 5–15)
BUN: 11 mg/dL (ref 6–20)
CO2: 25 mmol/L (ref 22–32)
Calcium: 9.3 mg/dL (ref 8.9–10.3)
Chloride: 103 mmol/L (ref 98–111)
Creatinine, Ser: 0.83 mg/dL (ref 0.44–1.00)
GFR, Estimated: >60 mL/min (ref >60)
Glucose, Bld: 82 mg/dL (ref 70–99)
Potassium: 3.6 mmol/L (ref 3.5–5.1)
Sodium: 138 mmol/L (ref 135–145)

## 2021-01-22 NOTE — Telephone Encounter (Signed)
Scheduled appts per 8/3 sch msg. Called pt, no answer and VM was full, unable to leave a msg. Will have updated calendar printed for pt at next visit.

## 2021-01-23 ENCOUNTER — Other Ambulatory Visit (HOSPITAL_COMMUNITY): Payer: Self-pay | Admitting: Surgery

## 2021-01-23 ENCOUNTER — Other Ambulatory Visit (HOSPITAL_COMMUNITY): Payer: Self-pay

## 2021-01-23 ENCOUNTER — Telehealth (INDEPENDENT_AMBULATORY_CARE_PROVIDER_SITE_OTHER): Payer: Self-pay

## 2021-01-23 DIAGNOSIS — C50912 Malignant neoplasm of unspecified site of left female breast: Secondary | ICD-10-CM

## 2021-01-23 DIAGNOSIS — C50911 Malignant neoplasm of unspecified site of right female breast: Secondary | ICD-10-CM

## 2021-01-23 NOTE — Telephone Encounter (Signed)
Called left message will try again tomorrow

## 2021-01-23 NOTE — Telephone Encounter (Signed)
Copied from [place] [phone]. Topic: General - Other >> Jan 22, 2021 11:34 AM Yvette Rack wrote: Reason for CRM: Pt requests to speak with Juluis Mire. Advised pt that provider is in clinic. Pt requests that Sharyn Lull return her call today if possible. Cb# [phone]

## 2021-01-25 NOTE — H&P (Signed)
PROVIDER: Beverlee Nims, MD  MRN: [id number] DOB: 10/07/1970 DATE OF ENCOUNTER: 01/16/2021 Subjective   Chief Complaint: bilateral breast cancer   History of Present Illness: Chelsea Hansen is a 50 y.o. female who is seen today for a follow-up regarding her bilateral breast cancers. Again, she was not able to undergo biopsy of the 2 suspicious areas and history of her breast by MRI. She is still adamant about saving her breast and not having mastectomies. Chemotherapy is been recommended as she is high risk on MammaPrint.  Review of Systems: A complete review of systems was obtained from the patient. I have reviewed this information and discussed as appropriate with the patient. See HPI as well for other ROS.  ROS   Medical History: Past Medical History:  Diagnosis Date   Anxiety   Asthma, unspecified asthma severity, unspecified whether complicated, unspecified whether persistent   History of cancer   There is no problem list on file for this patient.  Past Surgical History:  Procedure Laterality Date   HYSTERECTOMY    No Known Allergies  Current Outpatient Medications on File Prior to Visit  Medication Sig Dispense Refill   amLODIPine (NORVASC) 10 MG tablet Take 1 tablet by mouth once daily   atorvastatin (LIPITOR) 40 MG tablet Take 40 mg by mouth once daily   doxycycline (VIBRA-TABS) 100 MG tablet Take 1 tablet by mouth 2 (two) times daily   fluticasone propion-salmeteroL (ADVAIR HFA) 115-21 mcg/actuation inhaler Inhale into the lungs   fluticasone propionate (FLONASE) 50 mcg/actuation nasal spray Place into one nostril   hydroCHLOROthiazide (HYDRODIURIL) 25 MG tablet Take 1 tablet by mouth once daily   loratadine (CLARITIN) 10 mg tablet Take 1 tablet by mouth once daily   LORazepam (ATIVAN) 0.5 MG tablet Take 1 tablet (0.5 mg total) by mouth 1 hour prior to procedure and 1 tablet just before procedure   montelukast (SINGULAIR) 10 mg tablet Take by mouth    albuterol 90 mcg/actuation inhaler TAKE 2 PUFFS BY MOUTH EVERY 6 HOURS AS NEEDED FOR WHEEZE OR SHORTNESS OF BREATH   benzonatate (TESSALON) 200 MG capsule TAKE 1 CAPSULE BY MOUTH 3 TIMES A DAY FOR 7 DAYS   escitalopram oxalate (LEXAPRO) 20 MG tablet   sulfamethoxazole-trimethoprim (BACTRIM DS) 800-160 mg tablet Take 1 tablet by mouth 2 (two) times daily   No current facility-administered medications on file prior to visit.   Family History  Problem Relation Age of Onset   Breast cancer Mother    Social History   Tobacco Use  Smoking Status Current Every Day Smoker   Types: Cigarettes  Smokeless Tobacco Never Used    Social History   Socioeconomic History   Marital status: Unknown  Tobacco Use   Smoking status: Current Every Day Smoker  Types: Cigarettes   Smokeless tobacco: Never Used  Substance and Sexual Activity   Alcohol use: Never   Drug use: Never   Objective:   Vitals:  01/16/21 0944  BP: (!) 140/80  Pulse: 98  Temp: 36.4 C (97.6 F)  SpO2: 100%  Weight: 99.8 kg (220 lb)  Height: 180.3 cm ('5\' 11"'$ )   Body mass index is 30.68 kg/m.  Physical Exam   On exam, the breast mass is still large and palpable on the right side.  Lungs CTA CV RRR Abdomen soft, NT Neuro grossly intact Skin without rash    Assessment and Plan:   Bilateral breast cancer  This remains a difficult situation. Again, she is adamant  about saving both breast. She will need neoadjuvant chemotherapy. She also needs the other 2 suspicious areas biopsied I explained this to her in detail. We discussed Port-A-Cath insertion as will be needed for chemotherapy to try to shrink the tumor down so bilateral mastectomies could be performed. I discussed the risk of Port-A-Cath insertion. These risk include but are not limited to bleeding, infection, pneumothorax, the need for further procedures, etc. She understands we will try to schedule her for biopsy of the suspicious areas in her breast as  well as Port-A-Cath insertion for neoadjuvant therapy    Soila Printup Georgina Pillion, MD

## 2021-01-26 ENCOUNTER — Ambulatory Visit (HOSPITAL_COMMUNITY): Payer: Medicaid Other

## 2021-01-26 ENCOUNTER — Encounter (HOSPITAL_COMMUNITY): Payer: Self-pay | Admitting: Surgery

## 2021-01-26 ENCOUNTER — Ambulatory Visit (HOSPITAL_COMMUNITY): Payer: Medicaid Other | Admitting: Physician Assistant

## 2021-01-26 ENCOUNTER — Other Ambulatory Visit (HOSPITAL_COMMUNITY): Payer: Self-pay

## 2021-01-26 ENCOUNTER — Encounter (HOSPITAL_COMMUNITY)
Admission: RE | Disposition: A | Discharge status: Home / Self Care | Payer: Self-pay | Source: Home / Self Care | Attending: Surgery

## 2021-01-26 ENCOUNTER — Other Ambulatory Visit: Payer: Self-pay | Admitting: Oncology

## 2021-01-26 ENCOUNTER — Ambulatory Visit (HOSPITAL_COMMUNITY)
Admission: RE | Admit: 2021-01-26 | Discharge: 2021-01-26 | Disposition: A | Discharge status: Home / Self Care | Payer: Medicaid Other | Attending: Surgery | Admitting: Surgery

## 2021-01-26 ENCOUNTER — Ambulatory Visit (HOSPITAL_COMMUNITY): Payer: Medicaid Other | Admitting: Registered Nurse

## 2021-01-26 DIAGNOSIS — Z79899 Other long term (current) drug therapy: Secondary | ICD-10-CM | POA: Diagnosis not present

## 2021-01-26 DIAGNOSIS — Z170 Estrogen receptor positive status [ER+]: Secondary | ICD-10-CM

## 2021-01-26 DIAGNOSIS — C50912 Malignant neoplasm of unspecified site of left female breast: Secondary | ICD-10-CM | POA: Diagnosis not present

## 2021-01-26 DIAGNOSIS — K1370 Unspecified lesions of oral mucosa: Secondary | ICD-10-CM | POA: Insufficient documentation

## 2021-01-26 DIAGNOSIS — Z86718 Personal history of other venous thrombosis and embolism: Secondary | ICD-10-CM | POA: Insufficient documentation

## 2021-01-26 DIAGNOSIS — F17210 Nicotine dependence, cigarettes, uncomplicated: Secondary | ICD-10-CM | POA: Diagnosis not present

## 2021-01-26 DIAGNOSIS — C50911 Malignant neoplasm of unspecified site of right female breast: Secondary | ICD-10-CM | POA: Diagnosis present

## 2021-01-26 DIAGNOSIS — C50412 Malignant neoplasm of upper-outer quadrant of left female breast: Secondary | ICD-10-CM

## 2021-01-26 DIAGNOSIS — J392 Other diseases of pharynx: Secondary | ICD-10-CM

## 2021-01-26 DIAGNOSIS — Z452 Encounter for adjustment and management of vascular access device: Secondary | ICD-10-CM

## 2021-01-26 DIAGNOSIS — C50411 Malignant neoplasm of upper-outer quadrant of right female breast: Secondary | ICD-10-CM

## 2021-01-26 HISTORY — PX: PORTACATH PLACEMENT: SHX2246

## 2021-01-26 SURGERY — INSERTION, TUNNELED CENTRAL VENOUS DEVICE, WITH PORT
Anesthesia: General | Site: Chest | Laterality: Left

## 2021-01-26 MED ORDER — RACEPINEPHRINE HCL 2.25 % IN NEBU: 0.5000 mL | INHALATION_SOLUTION | Freq: Once | RESPIRATORY_TRACT | Status: AC

## 2021-01-26 MED ORDER — ORAL CARE MOUTH RINSE: 15.0000 mL | Freq: Once | OROMUCOSAL | Status: AC

## 2021-01-26 MED ORDER — FENTANYL CITRATE (PF) 100 MCG/2ML IJ SOLN
INTRAMUSCULAR | Status: AC
  Filled 2021-01-26: qty 2

## 2021-01-26 MED ORDER — PHENYLEPHRINE 40 MCG/ML (10ML) SYRINGE FOR IV PUSH (FOR BLOOD PRESSURE SUPPORT)
PREFILLED_SYRINGE | INTRAVENOUS | Status: DC | PRN
  Administered 2021-01-26: 120 ug via INTRAVENOUS

## 2021-01-26 MED ORDER — PHENYLEPHRINE 40 MCG/ML (10ML) SYRINGE FOR IV PUSH (FOR BLOOD PRESSURE SUPPORT)
PREFILLED_SYRINGE | INTRAVENOUS | Status: AC
  Filled 2021-01-26: qty 10

## 2021-01-26 MED ORDER — TRAMADOL HCL 50 MG PO TABS
50.0000 mg | ORAL_TABLET | Freq: Four times a day (QID) | ORAL | 0 refills | Status: DC | PRN
  Filled 2021-01-26: qty 20, 5d supply, fill #0

## 2021-01-26 MED ORDER — MIDAZOLAM HCL 2 MG/2ML IJ SOLN
INTRAMUSCULAR | Status: AC
  Filled 2021-01-26: qty 2

## 2021-01-26 MED ORDER — ACETAMINOPHEN 500 MG PO TABS
1000.0000 mg | ORAL_TABLET | ORAL | Status: DC
  Filled 2021-01-26: qty 2

## 2021-01-26 MED ORDER — DEXAMETHASONE SODIUM PHOSPHATE 10 MG/ML IJ SOLN
INTRAMUSCULAR | Status: AC
  Filled 2021-01-26: qty 1

## 2021-01-26 MED ORDER — BUPIVACAINE HCL (PF) 0.5 % IJ SOLN
INTRAMUSCULAR | Status: DC | PRN
  Administered 2021-01-26: 6 mL

## 2021-01-26 MED ORDER — FENTANYL CITRATE (PF) 100 MCG/2ML IJ SOLN: 25.0000 ug | INTRAMUSCULAR | Status: DC | PRN

## 2021-01-26 MED ORDER — CHLORHEXIDINE GLUCONATE 0.12 % MT SOLN
15.0000 mL | Freq: Once | OROMUCOSAL | Status: AC
  Administered 2021-01-26: 15 mL via OROMUCOSAL

## 2021-01-26 MED ORDER — OXYCODONE HCL 5 MG/5ML PO SOLN: 5.0000 mg | Freq: Once | ORAL | Status: DC | PRN

## 2021-01-26 MED ORDER — CEFAZOLIN SODIUM-DEXTROSE 2-4 GM/100ML-% IV SOLN
2.0000 g | INTRAVENOUS | Status: AC
  Administered 2021-01-26: 2 g via INTRAVENOUS
  Filled 2021-01-26: qty 100

## 2021-01-26 MED ORDER — ONDANSETRON HCL 4 MG/2ML IJ SOLN
INTRAMUSCULAR | Status: AC
  Filled 2021-01-26: qty 2

## 2021-01-26 MED ORDER — LIDOCAINE 2% (20 MG/ML) 5 ML SYRINGE
INTRAMUSCULAR | Status: DC | PRN
Start: 2021-01-26 — End: 2021-01-26
  Administered 2021-01-26: 50 mg via INTRAVENOUS

## 2021-01-26 MED ORDER — OXYCODONE HCL 5 MG PO TABS: 5.0000 mg | ORAL_TABLET | Freq: Once | ORAL | Status: DC | PRN

## 2021-01-26 MED ORDER — ONDANSETRON HCL 4 MG/2ML IJ SOLN
INTRAMUSCULAR | Status: DC | PRN
  Administered 2021-01-26: 4 mg via INTRAVENOUS

## 2021-01-26 MED ORDER — BUPIVACAINE HCL (PF) 0.5 % IJ SOLN
INTRAMUSCULAR | Status: AC
  Filled 2021-01-26: qty 30

## 2021-01-26 MED ORDER — PROMETHAZINE HCL 25 MG/ML IJ SOLN: 6.2500 mg | INTRAMUSCULAR | Status: DC | PRN

## 2021-01-26 MED ORDER — LIDOCAINE 2% (20 MG/ML) 5 ML SYRINGE
INTRAMUSCULAR | Status: AC
  Filled 2021-01-26: qty 5

## 2021-01-26 MED ORDER — RACEPINEPHRINE HCL 2.25 % IN NEBU
INHALATION_SOLUTION | RESPIRATORY_TRACT | Status: AC
  Administered 2021-01-26: 0.5 mL via RESPIRATORY_TRACT
  Filled 2021-01-26: qty 0.5

## 2021-01-26 MED ORDER — DEXAMETHASONE SODIUM PHOSPHATE 10 MG/ML IJ SOLN
INTRAMUSCULAR | Status: DC | PRN
  Administered 2021-01-26: 10 mg via INTRAVENOUS

## 2021-01-26 MED ORDER — CHLORHEXIDINE GLUCONATE CLOTH 2 % EX PADS: 6.0000 | MEDICATED_PAD | Freq: Once | CUTANEOUS | Status: DC

## 2021-01-26 MED ORDER — FENTANYL CITRATE (PF) 100 MCG/2ML IJ SOLN
INTRAMUSCULAR | Status: DC | PRN
  Administered 2021-01-26 (×2): 50 ug via INTRAVENOUS

## 2021-01-26 MED ORDER — PROPOFOL 10 MG/ML IV BOLUS
INTRAVENOUS | Status: AC
  Filled 2021-01-26: qty 40

## 2021-01-26 MED ORDER — HEPARIN SOD (PORK) LOCK FLUSH 100 UNIT/ML IV SOLN
INTRAVENOUS | Status: AC
  Filled 2021-01-26: qty 5

## 2021-01-26 MED ORDER — LACTATED RINGERS IV SOLN: INTRAVENOUS | Status: DC

## 2021-01-26 MED ORDER — MIDAZOLAM HCL 5 MG/5ML IJ SOLN
INTRAMUSCULAR | Status: DC | PRN
Start: 2021-01-26 — End: 2021-01-26
  Administered 2021-01-26: 2 mg via INTRAVENOUS

## 2021-01-26 MED ORDER — PROPOFOL 10 MG/ML IV BOLUS
INTRAVENOUS | Status: DC | PRN
  Administered 2021-01-26: 200 mg via INTRAVENOUS

## 2021-01-26 MED ORDER — HEPARIN 6000 UNIT IRRIGATION SOLUTION
Freq: Once | Status: AC
  Administered 2021-01-26: 1
  Filled 2021-01-26: qty 6000

## 2021-01-26 MED ORDER — HEPARIN SOD (PORK) LOCK FLUSH 100 UNIT/ML IV SOLN
INTRAVENOUS | Status: DC | PRN
  Administered 2021-01-26: 500 [IU] via INTRAVENOUS

## 2021-01-26 SURGICAL SUPPLY — 45 items
ADH SKN CLS APL DERMABOND .7 (GAUZE/BANDAGES/DRESSINGS) ×1
APL SKNCLS STERI-STRIP NONHPOA (GAUZE/BANDAGES/DRESSINGS)
BAG COUNTER SPONGE SURGICOUNT (BAG) IMPLANT
BAG DECANTER FOR FLEXI CONT (MISCELLANEOUS) ×2 IMPLANT
BAG SPNG CNTER NS LX DISP (BAG)
BENZOIN TINCTURE PRP APPL 2/3 (GAUZE/BANDAGES/DRESSINGS) IMPLANT
BLADE HEX COATED 2.75 (ELECTRODE) ×2 IMPLANT
BLADE SURG 15 STRL LF DISP TIS (BLADE) ×1 IMPLANT
BLADE SURG 15 STRL SS (BLADE) ×2
BLADE SURG SZ11 CARB STEEL (BLADE) ×2 IMPLANT
CATH PORTA XCELA P 8FR SL FA (Port) IMPLANT
DECANTER SPIKE VIAL GLASS SM (MISCELLANEOUS) ×1 IMPLANT
DERMABOND ADVANCED (GAUZE/BANDAGES/DRESSINGS) ×1
DERMABOND ADVANCED .7 DNX12 (GAUZE/BANDAGES/DRESSINGS) IMPLANT
DRAPE C-ARM 42X120 X-RAY (DRAPES) ×2 IMPLANT
DRAPE LAPAROSCOPIC ABDOMINAL (DRAPES) ×2 IMPLANT
DRSG TEGADERM 2-3/8X2-3/4 SM (GAUZE/BANDAGES/DRESSINGS) ×2 IMPLANT
DRSG TEGADERM 4X4.75 (GAUZE/BANDAGES/DRESSINGS) IMPLANT
ELECT REM PT RETURN 15FT ADLT (MISCELLANEOUS) ×2 IMPLANT
GAUZE 4X4 16PLY ~~LOC~~+RFID DBL (SPONGE) ×2 IMPLANT
GAUZE SPONGE 4X4 12PLY STRL (GAUZE/BANDAGES/DRESSINGS) IMPLANT
GLOVE SURG ENC MOIS LTX SZ7 (GLOVE) ×6 IMPLANT
GLOVE SURG ENC MOIS LTX SZ7.5 (GLOVE) ×2 IMPLANT
GLOVE SURG UNDER POLY LF SZ7 (GLOVE) ×2 IMPLANT
GOWN STRL REUS W/TWL LRG LVL3 (GOWN DISPOSABLE) ×2 IMPLANT
GOWN STRL REUS W/TWL XL LVL3 (GOWN DISPOSABLE) ×4 IMPLANT
KIT BASIN OR (CUSTOM PROCEDURE TRAY) ×2 IMPLANT
KIT PORT POWER 8FR ISP CVUE (Port) ×1 IMPLANT
KIT TURNOVER KIT A (KITS) ×2 IMPLANT
NDL HYPO 25X1 1.5 SAFETY (NEEDLE) ×1 IMPLANT
NEEDLE HYPO 25X1 1.5 SAFETY (NEEDLE) ×2 IMPLANT
NS IRRIG 1000ML POUR BTL (IV SOLUTION) ×2 IMPLANT
PACK BASIC VI WITH GOWN DISP (CUSTOM PROCEDURE TRAY) ×2 IMPLANT
PENCIL SMOKE EVACUATOR (MISCELLANEOUS) ×1 IMPLANT
STRIP CLOSURE SKIN 1/2X4 (GAUZE/BANDAGES/DRESSINGS) IMPLANT
SUT MNCRL AB 4-0 PS2 18 (SUTURE) ×3 IMPLANT
SUT PROLENE 2 0 SH DA (SUTURE) ×2 IMPLANT
SUT SILK 2 0 (SUTURE)
SUT SILK 2-0 30XBRD TIE 12 (SUTURE) IMPLANT
SUT VIC AB 3-0 SH 27 (SUTURE) ×4
SUT VIC AB 3-0 SH 27X BRD (SUTURE) IMPLANT
SUT VIC AB 3-0 SH 27XBRD (SUTURE) ×1 IMPLANT
SYR 10ML LL (SYRINGE) ×2 IMPLANT
SYR CONTROL 10ML LL (SYRINGE) ×2 IMPLANT
TOWEL OR 17X26 10 PK STRL BLUE (TOWEL DISPOSABLE) ×2 IMPLANT

## 2021-01-26 NOTE — Anesthesia Procedure Notes (Signed)
Procedure Name: LMA Insertion Date/Time: 01/26/2021 9:09 AM Performed by: Talbot Grumbling, CRNA Pre-anesthesia Checklist: Patient identified, Emergency Drugs available, Suction available and Patient being monitored Patient Re-evaluated:Patient Re-evaluated prior to induction Oxygen Delivery Method: Circle system utilized Preoxygenation: Pre-oxygenation with 100% oxygen Induction Type: IV induction LMA: LMA inserted LMA Size: 5.0 Number of attempts: 1 Placement Confirmation: positive ETCO2 and CO2 detector Tube secured with: Tape Dental Injury: Teeth and Oropharynx as per pre-operative assessment

## 2021-01-26 NOTE — Discharge Instructions (Addendum)
Ok to shower starting tomorrow after chemotherapy   Ice pack, tylenol, and ibuprofen also for pain  No vigorous activity for 2 weeks  PLEASE CALL [facility] AS SOON AS POSSIBLE FOR AN APPOINTMENT Dr. Jinny Blossom Scott[place]i [address] [phone]

## 2021-01-26 NOTE — Op Note (Signed)
INSERTION PORT-A-CATH  Procedure Note  Chelsea Hansen 01/26/2021   Pre-op Diagnosis: BILATERAL BREAST CANCER     Post-op Diagnosis: same  Procedure(s): INSERTION PORT-A-CATH LEFT SUBCLAVIAN VEIN (8 FR)  Surgeon(s): [name], MD  Anesthesia: General  Staff:  Circulator: Lauris Chroman, RN Relief Circulator: Tamala Julian, RN Scrub Person: Philomena Doheny, RN; Cranford Mon  Estimated Blood Loss: Minimal               Procedure: The patient was brought to operating identifies correct patient.  She is placed upon the operating table general anesthesia was induced.  Her left neck and chest with prepped and draped in usual sterile fashion.  The patient was placed in the Trendelenburg position.  I anesthetized the skin clavicle with Marcaine.  I then used the introducer needle to easily cannulate the left subclavian vein.  A wire was passed through the needle into the central venous system under fluoroscopy.  I removed the needle.  I then anesthetized skin further with Marcaine and made a longitudinal incision incorporating the wire site with a scalpel.  I then dissected down to the subcutaneous tissue with the cautery and created a pocket for the port.  An 8 French [place] port was then brought to the field and flushed appropriately.  The port fit easily into the pocket.  I attached the catheter to the port and cut an appropriate length.  I then placed the venous dilator and introducer sheath over the wire and into the central venous system easily.  The wire and dilator were then removed.  The catheter was then fed down the peel-away sheath into the central venous system.  The sheath was then pulled away leaving the catheter in central venous system.  Placement was confirmed in the SVC with the fluoroscopy.  I accessed the port and good flush and return were demonstrated.  The port was sutured in place with 3-0 Prolene sutures.  I then closed subtenons tissue with  interrupted 3-0 Vicryl sutures and closed skin with a running 4-0 Monocryl.  The port was accessed but I could not get return again.  I opened up the incision and then repositioned the catheter which was slightly kinked at the hub.  Again good flush and return were demonstrated.  I again closed the incision as before.  I again accessed the port and good flush and return were demonstrated.  The port was left accessed.  It was instilled with concentrated heparin solution.  Dermabond was placed on the incision.  A bandage was then placed over this.  Patient tolerated procedure well.  All the counts were correct at the end of the procedure.  The patient was then extubated in the operating room and taken in stable condition to the recovery room.          [name]   Date: 01/26/2021  Time: 10:02 AM

## 2021-01-26 NOTE — Interval H&P Note (Signed)
History and Physical Interval Note: no change in H and P  01/26/2021 7:48 AM  Chelsea Hansen  has presented today for surgery, with the diagnosis of BILATERAL BREAST CANCER.  The various methods of treatment have been discussed with the patient and family. After consideration of risks, benefits and other options for treatment, the patient has consented to  Procedure(s): INSERTION PORT-A-CATH (N/A) as a surgical intervention.  The patient's history has been reviewed, patient examined, no change in status, stable for surgery.  I have reviewed the patient's chart and labs.  Questions were answered to the patient's satisfaction.     [name]

## 2021-01-26 NOTE — Anesthesia Preprocedure Evaluation (Addendum)
Anesthesia Evaluation  Patient identified by MRN, date of birth, ID band Patient awake    Reviewed: Allergy & Precautions, NPO status , Patient's Chart, lab work & pertinent test results  History of Anesthesia Complications Negative for: history of anesthetic complications  Airway Mallampati: II  TM Distance: >3 FB Neck ROM: Full    Dental  (+) Dental Advisory Given   Pulmonary asthma ,    Pulmonary exam normal        Cardiovascular hypertension, Pt. on medications + DVT  Normal cardiovascular exam   '22 TTE - EF 60 to 65%. Left atrial size was mildly dilated. Trivial MR    Neuro/Psych  Headaches, PSYCHIATRIC DISORDERS Anxiety Depression    GI/Hepatic Neg liver ROS, GERD  Controlled,  Endo/Other   Obesity   Renal/GU negative Renal ROS     Musculoskeletal negative musculoskeletal ROS (+)   Abdominal   Peds  Hematology  (+) anemia ,   Anesthesia Other Findings   Reproductive/Obstetrics  Breast cancer                             Anesthesia Physical Anesthesia Plan  ASA: 2  Anesthesia Plan: General   Post-op Pain Management:    Induction: Intravenous  PONV Risk Score and Plan: 3 and Treatment may vary due to age or medical condition, Ondansetron, Dexamethasone and Midazolam  Airway Management Planned: LMA  Additional Equipment: None  Intra-op Plan:   Post-operative Plan: Extubation in OR  Informed Consent: I have reviewed the patients History and Physical, chart, labs and discussed the procedure including the risks, benefits and alternatives for the proposed anesthesia with the patient or authorized representative who has indicated his/her understanding and acceptance.     Dental advisory given  Plan Discussed with: CRNA and Anesthesiologist  Anesthesia Plan Comments:        Anesthesia Quick Evaluation

## 2021-01-26 NOTE — Anesthesia Postprocedure Evaluation (Signed)
Anesthesia Post Note  Patient: Chelsea Hansen  Procedure(s) Performed: INSERTION PORT-A-CATH (Left: Chest)     Patient location during evaluation: PACU Anesthesia Type: General Level of consciousness: sedated Pain management: pain level controlled Vital Signs Assessment: post-procedure vital signs reviewed and stable Respiratory status: spontaneous breathing and respiratory function stable Cardiovascular status: stable Postop Assessment: no apparent nausea or vomiting Anesthetic complications: no   No notable events documented.  Last Vitals:  Vitals:   01/26/21 1146 01/26/21 1208  BP:  (!) 139/92  Pulse:  63  Resp:  16  Temp: (!) 36.3 C (!) 36.4 C  SpO2:  98%    Last Pain:  Vitals:   01/26/21 1145  TempSrc:   PainSc: 0-No pain                 Takiesha Mcdevitt DANIEL

## 2021-01-26 NOTE — Transfer of Care (Signed)
Immediate Anesthesia Transfer of Care Note  Patient: Chelsea Hansen  Procedure(s) Performed: INSERTION PORT-A-CATH (Left: Chest)  Patient Location: PACU  Anesthesia Type:General  Level of Consciousness: awake, alert  and oriented  Airway & Oxygen Therapy: Patient Spontanous Breathing and Patient connected to face mask oxygen. Dr. Tobias Alexander in to see patient. No active bleeding from mouth.   Post-op Assessment: Report given to RN and Post -op Vital signs reviewed and stable  Post vital signs: Reviewed and stable  Last Vitals:  Vitals Value Taken Time  BP 146/91 01/26/21 1015  Temp    Pulse 64 01/26/21 1018  Resp 20 01/26/21 1018  SpO2 100 % 01/26/21 1018  Vitals shown include unvalidated device data.  Last Pain:  Vitals:   01/26/21 0750  TempSrc: Oral         Complications: No notable events documented.

## 2021-01-26 NOTE — Progress Notes (Signed)
Post procedure note: I was called during emergence of Chelsea Hansen surgery today.  Apparently, after removal of LMA, pt noted to have unusual intraoral bleeding.  Exam revealed an approximately 2x2cm intra-oral mass with attached clot in the left upper area of her oropharynx near her molars.  It was difficult to determine clot versus tissue.  This appears to be a pre-existing lesion.  I called ENT on call: Dr. Fredric Dine and discussed the patient and incident.  She asked that we attempt to obtain a CT of head and neck with contrast and her office will contact the patient for an outpatient workup. Discussed with Dr. Ninfa Linden who will communicate this with Dr. Jana Hakim. During her stay in PACU the bleeding gradually resolved.  She also spit some tissue out during this time.  By the time she was discharged the majority of the mass was gone.  Chelsea Hansen was given Dr. Dorathy Kinsman office number to set up an evaluation.

## 2021-01-27 ENCOUNTER — Inpatient Hospital Stay (HOSPITAL_BASED_OUTPATIENT_CLINIC_OR_DEPARTMENT_OTHER): Payer: Medicaid Other | Admitting: Oncology

## 2021-01-27 ENCOUNTER — Encounter (HOSPITAL_COMMUNITY): Payer: Self-pay | Admitting: Surgery

## 2021-01-27 ENCOUNTER — Other Ambulatory Visit: Payer: Self-pay

## 2021-01-27 ENCOUNTER — Encounter: Payer: Self-pay | Admitting: *Deleted

## 2021-01-27 ENCOUNTER — Inpatient Hospital Stay: Payer: Medicaid Other

## 2021-01-27 VITALS — BP 127/48 | HR 64 | Temp 97.6°F | Resp 18 | Wt 218.7 lb

## 2021-01-27 DIAGNOSIS — C50411 Malignant neoplasm of upper-outer quadrant of right female breast: Secondary | ICD-10-CM | POA: Diagnosis present

## 2021-01-27 DIAGNOSIS — C50412 Malignant neoplasm of upper-outer quadrant of left female breast: Secondary | ICD-10-CM | POA: Diagnosis not present

## 2021-01-27 DIAGNOSIS — Z170 Estrogen receptor positive status [ER+]: Secondary | ICD-10-CM | POA: Diagnosis not present

## 2021-01-27 DIAGNOSIS — Z5189 Encounter for other specified aftercare: Secondary | ICD-10-CM | POA: Diagnosis not present

## 2021-01-27 DIAGNOSIS — R112 Nausea with vomiting, unspecified: Secondary | ICD-10-CM | POA: Diagnosis not present

## 2021-01-27 DIAGNOSIS — Z95828 Presence of other vascular implants and grafts: Secondary | ICD-10-CM

## 2021-01-27 DIAGNOSIS — Z5111 Encounter for antineoplastic chemotherapy: Secondary | ICD-10-CM | POA: Diagnosis present

## 2021-01-27 DIAGNOSIS — K1230 Oral mucositis (ulcerative), unspecified: Secondary | ICD-10-CM | POA: Diagnosis not present

## 2021-01-27 LAB — COMPREHENSIVE METABOLIC PANEL
ALT: 16 U/L (ref 0–44)
AST: 13 U/L — ABNORMAL LOW (ref 15–41)
Albumin: 3.3 g/dL — ABNORMAL LOW (ref 3.5–5.0)
Alkaline Phosphatase: 93 U/L (ref 38–126)
Anion gap: 8 (ref 5–15)
BUN: 17 mg/dL (ref 6–20)
CO2: 26 mmol/L (ref 22–32)
Calcium: 9.8 mg/dL (ref 8.9–10.3)
Chloride: 105 mmol/L (ref 98–111)
Creatinine, Ser: 0.77 mg/dL (ref 0.44–1.00)
GFR, Estimated: >60 mL/min (ref >60)
Glucose, Bld: 112 mg/dL — ABNORMAL HIGH (ref 70–99)
Potassium: 4 mmol/L (ref 3.5–5.1)
Sodium: 139 mmol/L (ref 135–145)
Total Bilirubin: <0.2 mg/dL — ABNORMAL LOW (ref 0.3–1.2)
Total Protein: 7.1 g/dL (ref 6.5–8.1)

## 2021-01-27 LAB — CBC WITH DIFFERENTIAL/PLATELET
Abs Immature Granulocytes: 0.1 10*3/uL — ABNORMAL HIGH (ref 0.00–0.07)
Basophils Absolute: 0 10*3/uL (ref 0.0–0.1)
Basophils Relative: 0 %
Eosinophils Absolute: 0 10*3/uL (ref 0.0–0.5)
Eosinophils Relative: 0 %
HCT: 26.6 % — ABNORMAL LOW (ref 36.0–46.0)
Hemoglobin: 8.4 g/dL — ABNORMAL LOW (ref 12.0–15.0)
Immature Granulocytes: 1 %
Lymphocytes Relative: 17 %
Lymphs Abs: 2 10*3/uL (ref 0.7–4.0)
MCH: 23.1 pg — ABNORMAL LOW (ref 26.0–34.0)
MCHC: 31.6 g/dL (ref 30.0–36.0)
MCV: 73.3 fL — ABNORMAL LOW (ref 80.0–100.0)
Monocytes Absolute: 0.8 10*3/uL (ref 0.1–1.0)
Monocytes Relative: 6 %
Neutro Abs: 9.3 10*3/uL — ABNORMAL HIGH (ref 1.7–7.7)
Neutrophils Relative %: 76 %
Platelets: 326 10*3/uL (ref 150–400)
RBC: 3.63 MIL/uL — ABNORMAL LOW (ref 3.87–5.11)
RDW: 16.5 % — ABNORMAL HIGH (ref 11.5–15.5)
WBC: 12.2 10*3/uL — ABNORMAL HIGH (ref 4.0–10.5)
nRBC: 0 % (ref 0.0–0.2)

## 2021-01-27 MED ORDER — PALONOSETRON HCL INJECTION 0.25 MG/5ML
0.2500 mg | Freq: Once | INTRAVENOUS | Status: AC
  Administered 2021-01-27: 0.25 mg via INTRAVENOUS

## 2021-01-27 MED ORDER — SODIUM CHLORIDE 0.9% FLUSH
10.0000 mL | INTRAVENOUS | Status: DC | PRN
  Administered 2021-01-27: 10 mL
  Filled 2021-01-27: qty 10

## 2021-01-27 MED ORDER — SODIUM CHLORIDE 0.9 % IV SOLN
150.0000 mg | Freq: Once | INTRAVENOUS | Status: AC
  Administered 2021-01-27: 150 mg via INTRAVENOUS
  Filled 2021-01-27: qty 150

## 2021-01-27 MED ORDER — HEPARIN SOD (PORK) LOCK FLUSH 100 UNIT/ML IV SOLN
500.0000 [IU] | Freq: Once | INTRAVENOUS | Status: AC | PRN
  Administered 2021-01-27: 500 [IU]
  Filled 2021-01-27: qty 5

## 2021-01-27 MED ORDER — SODIUM CHLORIDE 0.9% FLUSH
10.0000 mL | Freq: Once | INTRAVENOUS | Status: AC
  Administered 2021-01-27: 10 mL
  Filled 2021-01-27: qty 10

## 2021-01-27 MED ORDER — SODIUM CHLORIDE 0.9 % IV SOLN
Freq: Once | INTRAVENOUS | Status: AC
  Filled 2021-01-27: qty 250

## 2021-01-27 MED ORDER — SODIUM CHLORIDE 0.9 % IV SOLN
10.0000 mg | Freq: Once | INTRAVENOUS | Status: AC
  Administered 2021-01-27: 10 mg via INTRAVENOUS
  Filled 2021-01-27: qty 10

## 2021-01-27 MED ORDER — SODIUM CHLORIDE 0.9 % IV SOLN
600.0000 mg/m2 | Freq: Once | INTRAVENOUS | Status: AC
  Administered 2021-01-27: 1340 mg via INTRAVENOUS
  Filled 2021-01-27: qty 67

## 2021-01-27 MED ORDER — PALONOSETRON HCL INJECTION 0.25 MG/5ML
INTRAVENOUS | Status: AC
  Filled 2021-01-27: qty 5

## 2021-01-27 MED ORDER — DOXORUBICIN HCL CHEMO IV INJECTION 2 MG/ML
60.0000 mg/m2 | Freq: Once | INTRAVENOUS | Status: AC
  Administered 2021-01-27: 134 mg via INTRAVENOUS
  Filled 2021-01-27: qty 67

## 2021-01-27 NOTE — Progress Notes (Signed)
[place]  [phone]) [phone] [phone]     ID: Chelsea Hansen DOB: [birth year]  MR#: [id number]  FKC#:[id number]  Patient Care Team: [name], NP as PCP - General (Internal Medicine) [name], RN as Oncology Nurse Navigator [name], RN as Oncology Nurse Navigator [name], MD as Consulting Physician (Radiation Oncology) Cassiopeia Florentino, Virgie Dad, MD as Consulting Physician (Oncology) [name], MD as Consulting Physician (General Surgery) Aurea Graff OTHER MD:  CHIEF COMPLAINT: Bilateral estrogen receptor positive breast cancer  CURRENT TREATMENT:  neoadjuvant chemotherapy   INTERVAL HISTORY: Chelsea Hansen returns today for follow-up of her breast cancer.  Since her last visit here she underwent left Port-A-Cath insertion.  After removal of her intubation exam revealed a 2 x 2 centimeter intraoral mass in the left upper area of her oropharynx near her molars.  ENT was involved (Dr. Edwyna Ready) and she suggested a CT of the head and neck.  Her office will contact the patient for further evaluation.  She underwent patient education 01/22/2021 and has the dexamethasone and prochlorperazine as well as [facility] medications on hand with instructions on how to take them.  Today Chelsea Hansen presented for her first cycle of doxorubicin and cyclophosphamide.  She had her echocardiogram 01/22/2021 showing an ejection fraction in the 60-65% range.  REVIEW OF SYSTEMS: Chelsea Hansen has not had any further problems with oral bleeding.  She was not aware that an ENT mass was noted.  A brief review of systems was otherwise noncontributory   COVID 19 VACCINATION STATUS: [place] x2, most recently 10/2020, no booster as of June 2022   HISTORY OF CURRENT ILLNESS: From the original intake note:  Chelsea Hansen has a history of excisional biopsy of a fibroadenoma from the lower outer right breast in 2011.  More recently she noted a knot in her right  breast.  She brought it to medical attention and proceeded to bilateral diagnostic mammography with tomography and bilateral breast ultrasonography at [facility] on 11/18/2020 showing: breast density category B;   RIGHT: 3.2 cm mass associated with architectural distortion and scattered calcifications in upper-outer right breast at 10 o'clock, corresponding to palpable concern; indeterminate 1.1 cm mass in upper-outer right breast at 9:30; solitary pathologic right axillary lymph node with eccentric cortical thickening up to 7 mm. LEFT: 0.8 cm mass involving upper-outer left breast at 2 o'clock; no pathologic left axillary lymphadenopathy.  Accordingly on 12/03/2020 she proceeded to biopsy of the bilateral breast areas in question. The pathology from this procedure ([id number]) showed:  Right Breast, 9:30 fibroadenoma Right Breast, 10 o'clock Invasive and in situ mammary carcinoma, e-cadherin negative, grade 2 Prognostic indicators significant for: estrogen receptor, 95% positive and progesterone receptor, 100% positive, both with strong staining intensity. Proliferation marker Ki67 at 40%. HER2 negative by immunohistochemistry (0). 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 Grade or-2  Prognostic indicators significant for: estrogen receptor, 95% positive and progesterone receptor, 100% positive, both with strong staining intensity. Proliferation marker Ki67 at 15%. HER2 negative by immunohistochemistry (1+).  Cancer Staging Malignant neoplasm of upper-outer quadrant of left breast in female, estrogen receptor positive ([place]) Staging form: Breast, AJCC 8th Edition - Clinical stage from 12/17/2020: cT1b, cN0, cM0, ER+, PR+, HER2- - Signed by [name], MD on 12/17/2020 Stage prefix: Initial diagnosis Laterality:  Left Staged by:  Pathologist and managing physician Stage used in treatment planning: Yes National guidelines used in treatment planning: Yes Type of national guideline used in treatment planning: NCCN  Malignant neoplasm of upper-outer quadrant of right breast in female, estrogen receptor positive ([place]) Staging form: Breast, AJCC 8th Edition - Clinical stage from 12/17/2020: cT2, cN1, cM0, ER+, PR+, HER2- - Signed by [name], MD on 12/17/2020 Stage prefix: Initial diagnosis Laterality: Right Staged by: Pathologist and managing physician Stage used in treatment planning: Yes National guidelines used in treatment planning: Yes Type of national guideline used in treatment planning: NCCN  The patient's subsequent history is as detailed below.   PAST MEDICAL HISTORY: Past Medical History:  Diagnosis Date   Allergy    seasonal allergies   Anxiety    on meds   Asthma    uses inhaler   Breast cancer ([place]) 2012   RIGHT lumpectomy   Cancer ([place]) 2022   RIGHT breast lump-dx 2022   Depression    on meds   DVT (deep venous thrombosis) ([place]) 2010   after hysterectomy   Family history of uterine cancer    GERD (gastroesophageal reflux disease)    with certain foods/OTC PRN meds   Headache(784.0)    Hyperlipidemia    on meds   Hypertension    on meds    PAST SURGICAL HISTORY: Past Surgical History:  Procedure Laterality Date   ABDOMINAL HYSTERECTOMY     BREAST EXCISIONAL BIOPSY Right 09/16/2009   BREAST SURGERY     lumpectomy   TUBAL LIGATION    Patient's ovaries are still in place, status post hysterectomy 08/18/2010 with benign pathology   FAMILY HISTORY: Family History  Problem Relation Age of Onset   Cancer Mother        ?, dx <50   Hypertension Father    Cancer Maternal Aunt        uterine vs ovarian, dx <50   Cancer Maternal Aunt        unknown type, dx <50   Uterine cancer Cousin 65   Colon polyps Neg Hx    Colon cancer Neg Hx    Esophageal cancer  Neg Hx    Stomach cancer Neg Hx    Her father is age 64 and her mother age 97 as of 11/2020. Her mother has a history of cancer (type unsure).  The patient's mother had two sisters also with cancer, one with uterine and the other cancer type is unknown to the patient. Caeley has 1 brother and 1 sister.    GYNECOLOGIC HISTORY:  No LMP recorded. Patient has had a hysterectomy. Menarche: 50 years old Age at first live birth: 50 years old [place] P 5 LMP 2012, with hysterectomy Contraceptive never used HRT never used  Hysterectomy? Yes, 07/2010 for fibroids (pathology in Epic) BSO? 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 She is single and lives at home with her three sons. Her two daughters also live here in [place]. Daughter Chandrea Zellman, age 48, works at [employer]. Daughter Jilda Panda Sample, age 61, and son Isiah Nils Pyle, age 62, work at [employer]. Son Sharlee Blew, age 47, and daughter Alvera Novel, age 41, are currently not employed. Jerlyn is not a Designer, fashion/clothing.    ADVANCED DIRECTIVES: not in place   HEALTH MAINTENANCE: Social History   Tobacco Use   Smoking status: Never   Smokeless tobacco: Never  Vaping Use   Vaping Use: Never used  Substance Use Topics   Alcohol use: Not Currently    Alcohol/week: 7.0 standard drinks    Types: 7 Standard drinks or equivalent per week    Comment: occassional   Drug use: No     Colonoscopy: 10/2020 (Dr. Fuller Plan), recall 2027  PAP: 05/2010, negative, repeat not indicated (s/p hysterectomy)  Bone density: n/a (age)   No Known Allergies  Current Outpatient Medications  Medication Sig Dispense Refill   acetaminophen (TYLENOL) 500 MG tablet Take 2 tablets (1,000 mg total) by mouth every 6 (six) hours as needed. This is maximum dose of tylenol daily. Do not exceed this dose of tylenol from all sources (check other over the counter and prescribed medications) (Patient taking differently: Take  3,000 mg by mouth every 6 (six) hours as needed for mild pain.) 30 tablet 0   albuterol (VENTOLIN HFA) 108 (90 Base) MCG/ACT inhaler Inhale 2 puffs into the lungs every 6 (six) hours as needed for wheezing or shortness of breath. 18 g 2   amLODipine (NORVASC) 10 MG tablet Take 1 tablet (10 mg total) by mouth daily. (Patient taking differently: Take 10 mg by mouth every evening.) 90 tablet 1   aspirin 500 MG tablet Take 1,000 mg by mouth every 6 (six) hours as needed for pain.     atorvastatin (LIPITOR) 40 MG tablet Take 1 tablet (40 mg total) by mouth daily. (Patient taking differently: Take 40 mg by mouth every evening.) 90 tablet 3   Cholecalciferol (VITAMIN D3) 2000 units capsule Take 2,000 Units by mouth daily.  11   dexamethasone (DECADRON) 4 MG tablet Take 2 tablets (8 mg total) by mouth daily. Take daily for 3 days after chemo. Take with food. 30 tablet 1   diazepam (VALIUM) 5 MG tablet Take 2 tablets (10 mg total) by mouth once for 1 dose 2 tablet 0   escitalopram (LEXAPRO) 20 MG tablet Take 1 tablet (20 mg total) by mouth daily. 90 tablet 1   hydrochlorothiazide (HYDRODIURIL) 25 MG tablet Take 1 tablet (25 mg total) by mouth daily. 90 tablet 1   lidocaine-prilocaine (EMLA) cream Apply to affected area once as directed 30 g 3   montelukast (SINGULAIR) 10 MG tablet Take 1 tablet (10 mg total) by mouth at bedtime. (Patient taking differently: Take 10 mg by mouth at bedtime as needed (allergies).) 90 tablet 3   prochlorperazine (COMPAZINE) 10 MG tablet Take 1 tablet (10 mg total) by mouth every 6 (six) hours as needed (Nausea or vomiting). 30 tablet 1   traMADol (ULTRAM) 50 MG tablet Take 1 tablet by mouth every 6 hours as needed for moderate pain or severe pain. 30 tablet 0   No current facility-administered medications for this visit.    OBJECTIVE: African-American woman examined in the infusion area  There were no vitals filed for this visit.  For vitals associated with the 01/27/2021  visit please see the infusion area flowsheet     There is no height or weight on file to calculate BMI.   Wt Readings from Last 3 Encounters:  01/15/21 221 lb 1.6 oz (100.3 kg)  12/17/20 227 lb 3.2 oz (103.1 kg)  12/02/20 230 lb 4.8 oz (104.5 kg)     ECOG FS:1 - Symptomatic but completely ambulatory  Sclerae unicteric, EOMs intact Wearing a mask Lungs no rales or rhonchi Heart regular rate and rhythm Abd soft, nontender, positive bowel sounds Breasts: Deferred   LAB RESULTS:  CMP     Component Value Date/Time  NA 138 01/22/2021 1115   NA 139 10/06/2020 1432   K 3.6 01/22/2021 1115   CL 103 01/22/2021 1115   CO2 25 01/22/2021 1115   GLUCOSE 82 01/22/2021 1115   BUN 11 01/22/2021 1115   BUN 11 10/06/2020 1432   CREATININE 0.83 01/22/2021 1115   CREATININE 0.91 12/17/2020 0831   CALCIUM 9.3 01/22/2021 1115   PROT 7.4 12/17/2020 0831   PROT 6.9 10/06/2020 1432   ALBUMIN 3.4 (L) 12/17/2020 0831   ALBUMIN 4.1 10/06/2020 1432   AST 15 12/17/2020 0831   ALT 18 12/17/2020 0831   ALKPHOS 125 12/17/2020 0831   BILITOT 0.3 12/17/2020 0831   GFRNONAA >60 01/22/2021 1115   GFRNONAA >60 12/17/2020 0831   GFRAA >60 09/07/2019 1340    Lab Results  Component Value Date   TOTALPROTELP 7.0 12/31/2011   ALBUMINELP 56.4 12/31/2011   A1GS 4.3 12/31/2011   A2GS 10.4 12/31/2011   BETS 6.4 12/31/2011   BETA2SER 4.6 12/31/2011   GAMS 17.9 12/31/2011   MSPIKE NOT DETECTED 12/31/2011   SPEI (NOTE) 12/31/2011    Lab Results  Component Value Date   WBC 9.3 01/22/2021   NEUTROABS 4.4 12/17/2020   HGB 10.6 (L) 01/22/2021   HCT 34.3 (L) 01/22/2021   MCV 75.1 (L) 01/22/2021   PLT 321 01/22/2021    No results found for: LABCA2  No components found for: IHKVQQ595  No results for input(s): INR in the last 168 hours.  No results found for: LABCA2  No results found for: GLO756  No results found for: EPP295  No results found for: JOA416  No results found for: CA2729  No  components found for: HGQUANT  No results found for: CEA1 / No results found for: CEA1   No results found for: AFPTUMOR  No results found for: [facility]  Lab Results  Component Value Date   KAPLAMBRATIO 10.08 12/31/2011   (kappa/lambda light chains)  No results found for: HGBA, HGBA2QUANT, HGBFQUANT, HGBSQUAN (Hemoglobinopathy evaluation)   Lab Results  Component Value Date   LDH 194 12/31/2011    Lab Results  Component Value Date   IRON 95 12/31/2011   TIBC 325 12/31/2011   IRONPCTSAT 29 12/31/2011   (Iron and TIBC)  Lab Results  Component Value Date   FERRITIN 37 12/31/2011    Urinalysis    Component Value Date/Time   COLORURINE YELLOW 09/07/2019 1341   APPEARANCEUR HAZY (A) 09/07/2019 1341   LABSPEC 1.021 09/07/2019 1341   PHURINE 9.0 (H) 09/07/2019 1341   GLUCOSEU NEGATIVE 09/07/2019 1341   HGBUR NEGATIVE 09/07/2019 1341   BILIRUBINUR NEGATIVE 09/07/2019 1341   KETONESUR NEGATIVE 09/07/2019 1341   PROTEINUR 30 (A) 09/07/2019 1341   UROBILINOGEN 0.2 11/01/2013 1246   NITRITE NEGATIVE 09/07/2019 1341   LEUKOCYTESUR NEGATIVE 09/07/2019 1341    STUDIES: DG CHEST PORT 1 VIEW  Result Date: 01/26/2021 CLINICAL DATA:  Status post left Port-A-Cath placement. EXAM: PORTABLE CHEST 1 VIEW COMPARISON:  May 28, 2020. FINDINGS: Stable cardiomegaly. Interval placement of left subclavian Port-A-Cath with distal tip in expected position of cavoatrial junction. No pneumothorax is noted. Lungs are clear. Bony thorax is unremarkable. IMPRESSION: Interval placement of left subclavian Port-A-Cath with distal tip in expected position of cavoatrial junction. Electronically Signed   By: Marijo Conception M.D.   On: 01/26/2021 11:19   DG C-Arm 1-60 Min-No Report  Result Date: 01/26/2021 Fluoroscopy was utilized by the requesting physician.  No radiographic interpretation.   US BREAST LTD  UNI RIGHT INC AXILLA  Result Date: 12/30/2020 CLINICAL DATA:  Patient returns after MRI  for evaluation of the RIGHT axilla. Recent diagnosis of RIGHT breast grade 2 invasive mammary carcinoma and metastatic RIGHT axillary lymph node and new diagnosis of LEFT breast invasive ductal carcinoma. Biopsies were performed on 12/03/2020. MRI was performed on 12/23/2020, showing a rim enhancing T2 bright lesion in the RIGHT axilla for which ultrasound was recommended. Patient is also scheduled for bilateral MRI biopsies on 12/31/2020. Today patient complains of pain and swelling in the RIGHT axilla. She reports starting yesterday, a copious amount of yellow cloudy fluid is leaking from the biopsy site in the RIGHT axilla. EXAM: ULTRASOUND OF THE RIGHT AXILLA COMPARISON:  Previous exam(s). FINDINGS: On physical exam, there is a 1 centimeter erythematous site of leaking in the UPPER RIGHT axilla, at the biopsy site per patient. In this region, patient is tender during exam. No fluid is expressed from this lesion during exam. Targeted ultrasound is performed, showing at the site of patient's concern, there is focal hyperechoic tissue. There is a small heterogeneous fluid collection at the site which measures 2.3 x 0.8 x 1.7 centimeters. Area is vascular on Doppler evaluation. No other abnormalities are identified within the RIGHT axilla to correlate with the MRI finding. IMPRESSION: The MRI finding and the area of patient's concern today represent a small abscess at the biopsy site. There is no drainable fluid within the collection at this time. RECOMMENDATION: Recommend course of antibiotics. Patient was given doxycycline 100 milligrams twice a day for 7 days. Patient is scheduled for bilateral MR guided core biopsy tomorrow. I have discussed the findings and recommendations with the patient. If applicable, a reminder letter will be sent to the patient regarding the next appointment. BI-RADS CATEGORY  2: Benign. Electronically Signed   By: Nolon Nations M.D.   On: 12/30/2020 15:33  ECHOCARDIOGRAM  COMPLETE  Result Date: 01/22/2021    ECHOCARDIOGRAM REPORT   Patient Name:   LYRIQ FINERTY Date of Exam: 01/22/2021 Medical Rec #:  [id number]     Height:       71.0 in Accession #:    [id number]    Weight:       221.1 lb Date of Birth:  1971-03-04    BSA:          2.200 m Patient Age:    88 years      BP:           120/77 mmHg Patient Gender: F             HR:           66 bpm. Exam Location:  Inpatient Procedure: 2D Echo, Color Doppler, Cardiac Doppler and Strain Analysis Indications:    Pre-Chemo Evaluation  History:        Patient has prior history of Echocardiogram examinations, most                 recent 01/01/2012. Risk Factors:Breast Cancer.  Sonographer:    Raquel Sarna Senior RDCS Referring Phys: [place] Comments: Technically difficult due to poor echo windows. IMPRESSIONS  1. Left ventricular ejection fraction, by estimation, is 60 to 65%. The left ventricle has normal function. The left ventricle has no regional wall motion abnormalities. Left ventricular diastolic parameters were normal.  2. Right ventricular systolic function is normal. The right ventricular size is normal.  3. Left atrial size was mildly dilated.  4. The mitral valve  is normal in structure. Trivial mitral valve regurgitation. No evidence of mitral stenosis.  5. The aortic valve was not well visualized. There is mild calcification of the aortic valve. Aortic valve regurgitation is not visualized. No aortic stenosis is present.  6. The inferior vena cava is normal in size with greater than 50% respiratory variability, suggesting right atrial pressure of 3 mmHg. FINDINGS  Left Ventricle: Left ventricular ejection fraction, by estimation, is 60 to 65%. The left ventricle has normal function. The left ventricle has no regional wall motion abnormalities. The left ventricular internal cavity size was normal in size. There is  no left ventricular hypertrophy. Left ventricular diastolic parameters were normal. Right  Ventricle: The right ventricular size is normal. No increase in right ventricular wall thickness. Right ventricular systolic function is normal. Left Atrium: Left atrial size was mildly dilated. Right Atrium: Right atrial size was normal in size. Pericardium: There is no evidence of pericardial effusion. Mitral Valve: The mitral valve is normal in structure. Trivial mitral valve regurgitation. No evidence of mitral valve stenosis. Tricuspid Valve: The tricuspid valve is normal in structure. Tricuspid valve regurgitation is trivial. No evidence of tricuspid stenosis. Aortic Valve: The aortic valve was not well visualized. There is mild calcification of the aortic valve. Aortic valve regurgitation is not visualized. No aortic stenosis is present. Pulmonic Valve: The pulmonic valve was normal in structure. Pulmonic valve regurgitation is not visualized. No evidence of pulmonic stenosis. Aorta: The aortic root is normal in size and structure. Venous: The inferior vena cava is normal in size with greater than 50% respiratory variability, suggesting right atrial pressure of 3 mmHg. IAS/Shunts: No atrial level shunt detected by color flow Doppler.  LEFT VENTRICLE PLAX 2D LVIDd:         4.80 cm  Diastology LVIDs:         3.30 cm  LV e' medial:    6.74 cm/s LV PW:         1.20 cm  LV E/e' medial:  11.3 LV IVS:        1.10 cm  LV e' lateral:   8.16 cm/s LVOT diam:     2.00 cm  LV E/e' lateral: 9.4 LV SV:         77 LV SV Index:   35       2D Longitudinal Strain LVOT Area:     3.14 cm 2D Strain GLS Avg:     -20.8 %  RIGHT VENTRICLE RV S prime:     12.60 cm/s TAPSE (M-mode): 2.5 cm LEFT ATRIUM             Index       RIGHT ATRIUM           Index LA diam:        3.60 cm 1.64 cm/m  RA Area:     14.20 cm LA Vol (A2C):   50.3 ml 22.86 ml/m RA Volume:   32.80 ml  14.91 ml/m LA Vol (A4C):   58.7 ml 26.68 ml/m LA Biplane Vol: 55.8 ml 25.36 ml/m  AORTIC VALVE LVOT Vmax:   120.00 cm/s LVOT Vmean:  85.000 cm/s LVOT VTI:    0.245 m   AORTA Ao Root diam: 3.40 cm Ao Asc diam:  3.70 cm MITRAL VALVE MV Area (PHT): 3.16 cm    SHUNTS MV Decel Time: 240 msec    Systemic VTI:  0.24 m MV E velocity: 76.30 cm/s  Systemic Diam: 2.00 cm MV A  velocity: 75.40 cm/s MV E/A ratio:  1.01 Glori Bickers MD Electronically signed by Glori Bickers MD Signature Date/Time: 01/22/2021/2:57:06 PM    Final      ELIGIBLE FOR AVAILABLE RESEARCH PROTOCOL: no  ASSESSMENT: 50 y.o. [place] woman status post bilateral breast biopsies 12/03/2020, showing  (1) on the right, a clinical T2 N1, stage IIa invasive lobular carcinoma, grade 2, estrogen and progesterone receptor strongly positive, HER2 not amplified, with an MIB-1 of 40%   (a) the biopsied right axillary lymph node was positive with extracapsular extension   (b) a second right breast mass also biopsied was a fibroadenoma, concordant   (c) biopsy of an area of non-mass-like enhancement in the upper right breast pending   (2) on the left, a clinical T1b N0, stage IA invasive ductal carcinoma, grade 1 or 2, estrogen and progesterone receptor positive, HER2 not amplified, with an MIB 1 of 15%   (a) biopsy of a 0.4 cm enhancement in the medial left breast pending  (3) genetics testing through the [facility] BRCAplus panel (report date 12/24/2020) or the CancerNext-Expanded + RNAinsight panel (report date 12/31/2020) found no deleterious mutations in ATM, BRCA1, BRCA2, CDH1, CHEK2, PALB2, PTEN, and TP53. The CancerNext-Expanded + RNAinsight gene panel offered by [employer] and includes sequencing and rearrangement analysis for the following 77 genes: AIP, ALK, APC, ATM, AXIN2, BAP1, BARD1, BLM, BMPR1A, BRCA1, BRCA2, BRIP1, CDC73, CDH1, CDK4, CDKN1B, CDKN2A, CHEK2, CTNNA1, DICER1, FANCC, FH, FLCN, GALNT12, KIF1B, LZTR1, MAX, MEN1, MET, MLH1, MSH2, MSH3, MSH6, MUTYH, NBN, NF1, NF2, NTHL1, PALB2, PHOX2B, PMS2, POT1, PRKAR1A, PTCH1, PTEN, RAD51C, RAD51D, RB1, RECQL, RET, SDHA, SDHAF2, SDHB, SDHC, SDHD, SMAD4,  SMARCA4, SMARCB1, SMARCE1, STK11, SUFU, TMEM127, TP53, TSC1, TSC2, VHL and XRCC2 (sequencing and deletion/duplication); EGFR, EGLN1, HOXB13, KIT, MITF, PDGFRA, POLD1 and POLE (sequencing only); EPCAM and GREM1 (deletion/duplication only). RNA data is routinely analyzed for use in variant interpretation for all genes.  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  (5) definitive surgery pending  (6) chemotherapy consisting of doxorubicin and cyclophosphamide in dose dense fashion x4 started 01/27/2021 to be followed by weekly paclitaxel x12.  (7) adjuvant radiation on the right, possibly also on the left  (8) antiestrogens to follow at the completion of local treatment  (9) oral mass noted at time of intubation for port placement:  (A) CT head and neck  (B) ENT evaluation   PLAN: Chelsea Hansen is starting her chemotherapy today.  She knows that she is scheduled to return in 3 days to have her immune booster shot.  I have also scheduled her to return to see me in 1 week and I have asked her to keep a symptom diary so we can troubleshoot any problems as they arise.  At that visit we will also review her nadir can to see whether she needs antibiotics prophylactically and if they have been done review the head CT and CT of the neck to evaluate the questionable mass noted  during intubation for her port.  She tells me that she is expecting to meet with ENT within the next week as well  Recall that a Prerana still has not had biopsies of the right and breast areas which remain questionable.  It would be important for her to get this done as she strongly wishes for breast conservation.  Total encounter time today 25 minutes.Sarajane Jews C. Pamela Maddy, MD 01/27/2021 5:30 AM Medical Oncology and  Hematology [facility] [place], [place] [address] Tel. [phone]    Fax. [phone]   This document serves as a record of services personally performed by Lurline Del, MD. It was created on his behalf by Wilburn Mylar, a trained medical scribe. The creation of this record is based on the scribe's personal observations and the provider's statements to them.   I, Lurline Del MD, have reviewed the above documentation for accuracy and completeness, and I agree with the above.   *Total Encounter Time as defined by the Centers for Medicare and Medicaid Services includes, in addition to the face-to-face time of a patient visit (documented in the note above) non-face-to-face time: obtaining and reviewing outside history, ordering and reviewing medications, tests or procedures, care coordination (communications with other health care professionals or caregivers) and documentation in the medical record.

## 2021-01-27 NOTE — Patient Instructions (Signed)
[facility] MEDICAL [facility]COL[facility]  Discharge Instructions: Thank you for choosing [facility] to provide your oncology and hematology care.   If you have a lab appointment with the Cancer Center, please go directly to the Cancer Center and check in at the registration area.   Wear comfortable clothing and clothing appropriate for easy access to any Portacath or PICC line.   We strive to give you quality time with your provider. You may need to reschedule your appointment if you arrive late (15 or more minutes).  Arriving late affects you and other patients whose appointments are after yours.  Also, if you miss three or more appointments without notifying the office, you may be dismissed from the clinic at the provider's discretion.      For prescription refill requests, have your pharmacy contact our office and allow 72 hours for refills to be completed.    Today you received the following chemotherapy and/or immunotherapy agents: Adriamycin, Cytoxan     To help prevent nausea and vomiting after your treatment, we encourage you to take your nausea medication as directed.  BELOW ARE SYMPTOMS THAT SHOULD BE REPORTED IMMEDIATELY: . *FEVER GREATER THAN 100.4 F (38 C) OR HIGHER . *CHILLS OR SWEATING . *NAUSEA AND VOMITING THAT IS NOT CONTROLLED WITH YOUR NAUSEA MEDICATION . *UNUSUAL SHORTNESS OF BREATH . *UNUSUAL BRUISING OR BLEEDING . *URINARY PROBLEMS (pain or burning when urinating, or frequent urination) . *BOWEL PROBLEMS (unusual diarrhea, constipation, pain near the anus) . TENDERNESS IN MOUTH AND THROAT WITH OR WITHOUT PRESENCE OF ULCERS (sore throat, sores in mouth, or a toothache) . UNUSUAL RASH, SWELLING OR PAIN  . UNUSUAL VAGINAL DISCHARGE OR ITCHING   Items with * indicate a potential emergency and should be followed up as soon as possible or go to the Emergency Department if any problems should occur.  Please show the CHEMOTHERAPY ALERT CARD or  IMMUNOTHERAPY ALERT CARD at check-in to the Emergency Department and triage nurse.  Should you have questions after your visit or need to cancel or reschedule your appointment, please contact [facility]  Dept: [phone]  and follow the prompts.  Office hours are 8:00 a.m. to 4:30 p.m. Monday - Friday. Please note that voicemails left after 4:00 p.m. may not be returned until the following business day.  We are closed weekends and major holidays. You have access to a nurse at all times for urgent questions. Please call the main number to the clinic Dept: [phone] and follow the prompts.   For any non-urgent questions, you may also contact your provider using MyChart. We now offer e-Visits for anyone 18 and older to request care online for non-urgent symptoms. For details visit [email or web address].   Also download the MyChart app! Go to the app store, search "MyChart", open the app, select [facility], and log in with your MyChart username and password.  Due to Covid, a mask is required upon entering the hospital/clinic. If you do not have a mask, one will be given to you upon arrival. For doctor visits, patients may have 1 support person aged 18 or older with them. For treatment visits, patients cannot have anyone with them due to current Covid guidelines and our immunocompromised population.   

## 2021-01-28 ENCOUNTER — Telehealth: Payer: Self-pay | Admitting: *Deleted

## 2021-01-28 ENCOUNTER — Other Ambulatory Visit (HOSPITAL_COMMUNITY): Payer: Self-pay

## 2021-01-28 ENCOUNTER — Telehealth: Payer: Self-pay | Admitting: Oncology

## 2021-01-28 ENCOUNTER — Emergency Department (HOSPITAL_COMMUNITY): Payer: Medicaid Other

## 2021-01-28 ENCOUNTER — Encounter (HOSPITAL_COMMUNITY): Payer: Self-pay

## 2021-01-28 ENCOUNTER — Inpatient Hospital Stay: Admission: RE | Admit: 2021-01-28 | Payer: Medicaid Other | Source: Ambulatory Visit

## 2021-01-28 ENCOUNTER — Other Ambulatory Visit: Payer: Self-pay | Admitting: Oncology

## 2021-01-28 ENCOUNTER — Emergency Department (HOSPITAL_COMMUNITY)
Admission: EM | Admit: 2021-01-28 | Discharge: 2021-01-28 | Disposition: A | Discharge status: Home / Self Care | Payer: Medicaid Other | Attending: Emergency Medicine | Admitting: Emergency Medicine

## 2021-01-28 ENCOUNTER — Other Ambulatory Visit: Payer: Medicaid Other

## 2021-01-28 DIAGNOSIS — D72829 Elevated white blood cell count, unspecified: Secondary | ICD-10-CM | POA: Insufficient documentation

## 2021-01-28 DIAGNOSIS — I10 Essential (primary) hypertension: Secondary | ICD-10-CM | POA: Diagnosis not present

## 2021-01-28 DIAGNOSIS — R001 Bradycardia, unspecified: Secondary | ICD-10-CM | POA: Insufficient documentation

## 2021-01-28 DIAGNOSIS — J45909 Unspecified asthma, uncomplicated: Secondary | ICD-10-CM | POA: Insufficient documentation

## 2021-01-28 DIAGNOSIS — Z79899 Other long term (current) drug therapy: Secondary | ICD-10-CM | POA: Diagnosis not present

## 2021-01-28 DIAGNOSIS — Z853 Personal history of malignant neoplasm of breast: Secondary | ICD-10-CM | POA: Diagnosis not present

## 2021-01-28 DIAGNOSIS — R112 Nausea with vomiting, unspecified: Secondary | ICD-10-CM | POA: Insufficient documentation

## 2021-01-28 LAB — CBC WITH DIFFERENTIAL/PLATELET
Abs Immature Granulocytes: 0.17 10*3/uL — ABNORMAL HIGH (ref 0.00–0.07)
Basophils Absolute: 0 10*3/uL (ref 0.0–0.1)
Basophils Relative: 0 %
Eosinophils Absolute: 0 10*3/uL (ref 0.0–0.5)
Eosinophils Relative: 0 %
HCT: 29.4 % — ABNORMAL LOW (ref 36.0–46.0)
Hemoglobin: 9.1 g/dL — ABNORMAL LOW (ref 12.0–15.0)
Immature Granulocytes: 1 %
Lymphocytes Relative: 9 %
Lymphs Abs: 1.1 10*3/uL (ref 0.7–4.0)
MCH: 23.5 pg — ABNORMAL LOW (ref 26.0–34.0)
MCHC: 31 g/dL (ref 30.0–36.0)
MCV: 76 fL — ABNORMAL LOW (ref 80.0–100.0)
Monocytes Absolute: 0.7 10*3/uL (ref 0.1–1.0)
Monocytes Relative: 6 %
Neutro Abs: 10.7 10*3/uL — ABNORMAL HIGH (ref 1.7–7.7)
Neutrophils Relative %: 84 %
Platelets: 387 10*3/uL (ref 150–400)
RBC: 3.87 MIL/uL (ref 3.87–5.11)
RDW: 17.1 % — ABNORMAL HIGH (ref 11.5–15.5)
WBC: 12.6 10*3/uL — ABNORMAL HIGH (ref 4.0–10.5)
nRBC: 0 % (ref 0.0–0.2)

## 2021-01-28 LAB — COMPREHENSIVE METABOLIC PANEL
ALT: 17 U/L (ref 0–44)
AST: 19 U/L (ref 15–41)
Albumin: 3.7 g/dL (ref 3.5–5.0)
Alkaline Phosphatase: 87 U/L (ref 38–126)
Anion gap: 11 (ref 5–15)
BUN: 18 mg/dL (ref 6–20)
CO2: 24 mmol/L (ref 22–32)
Calcium: 9.4 mg/dL (ref 8.9–10.3)
Chloride: 103 mmol/L (ref 98–111)
Creatinine, Ser: 0.68 mg/dL (ref 0.44–1.00)
GFR, Estimated: >60 mL/min (ref >60)
Glucose, Bld: 113 mg/dL — ABNORMAL HIGH (ref 70–99)
Potassium: 4.2 mmol/L (ref 3.5–5.1)
Sodium: 138 mmol/L (ref 135–145)
Total Bilirubin: 0.3 mg/dL (ref 0.3–1.2)
Total Protein: 7.6 g/dL (ref 6.5–8.1)

## 2021-01-28 LAB — MAGNESIUM: Magnesium: 2 mg/dL (ref 1.7–2.4)

## 2021-01-28 LAB — LIPASE, BLOOD: Lipase: 21 U/L (ref 11–51)

## 2021-01-28 LAB — FOLLICLE STIMULATING HORMONE: FSH: 16.7 m[IU]/mL

## 2021-01-28 MED ORDER — PROMETHAZINE HCL 25 MG RE SUPP
25.0000 mg | Freq: Three times a day (TID) | RECTAL | 0 refills | Status: DC | PRN
  Filled 2021-01-28: qty 6, 2d supply, fill #0

## 2021-01-28 MED ORDER — DEXAMETHASONE SODIUM PHOSPHATE 10 MG/ML IJ SOLN
6.0000 mg | Freq: Once | INTRAMUSCULAR | Status: AC
  Administered 2021-01-28: 6 mg via INTRAVENOUS
  Filled 2021-01-28: qty 1

## 2021-01-28 MED ORDER — ALUM & MAG HYDROXIDE-SIMETH 200-200-20 MG/5ML PO SUSP
30.0000 mL | Freq: Once | ORAL | Status: AC
  Administered 2021-01-28: 30 mL via ORAL
  Filled 2021-01-28: qty 30

## 2021-01-28 MED ORDER — SODIUM CHLORIDE 0.9 % IV SOLN
12.5000 mg | Freq: Once | INTRAVENOUS | Status: DC
  Filled 2021-01-28: qty 0.5

## 2021-01-28 MED ORDER — FAMOTIDINE IN NACL 20-0.9 MG/50ML-% IV SOLN
20.0000 mg | Freq: Once | INTRAVENOUS | Status: AC
  Administered 2021-01-28: 20 mg via INTRAVENOUS
  Filled 2021-01-28: qty 50

## 2021-01-28 MED ORDER — LIDOCAINE VISCOUS HCL 2 % MT SOLN
15.0000 mL | Freq: Once | OROMUCOSAL | Status: AC
  Administered 2021-01-28: 15 mL via ORAL
  Filled 2021-01-28: qty 15

## 2021-01-28 NOTE — Telephone Encounter (Signed)
Scheduled appt per 8/10 sch msg. Called pt, no answer and vm was full.

## 2021-01-28 NOTE — ED Notes (Signed)
Provider at the bedside.  

## 2021-01-28 NOTE — ED Notes (Signed)
Pt to imaging at this time.

## 2021-01-28 NOTE — Telephone Encounter (Signed)
Received vm call this am from daughter but unable to understand message.  Returned call & daughter informed that pt is in the [facility] ED & states that she was vomiting blood this am.  Message routed to Dr Magrinat/Pod RN.

## 2021-01-28 NOTE — ED Provider Notes (Signed)
[facility] DEPT Provider Note   CSN: [id number] Arrival date & time: 01/28/21  0757     History Chief Complaint  Patient presents with   Hematemesis    Chelsea Hansen is a 50 y.o. female with history of breast cancer on chemotherapy, hypertension, hyperlipidemia, reflux, presenting to the emergency department with nausea and vomiting.  Patient reports she had her first round of chemotherapy yesterday.  She had a chest port placed in the left chest wall earlier this month.  She reports that late last night and early this morning she began to feel extremely nauseated, and several episodes of vomiting, reported some subjective fevers and chills at home.  She reported after her most recent episode of vomiting she noted blood tension to her vomit.  The vomiting is since improved at this morning, but she feels fatigued.  She is not on any blood thinners.  She reports of burning pain in her epigastrium up towards her throat.  She denies shortness of breath or cough.  She is here with her son at bedside.  Oncologist is Dr Jana Hakim - per office note yesterday patient recycled doxorubicin and cyclophosphamide yesterday. See below for cancer staging per oncology note:   Cancer Staging Malignant neoplasm of upper-outer quadrant of left breast in female, estrogen receptor positive ([name]) Staging form: Breast, AJCC 8th Edition - Clinical stage from 12/17/2020: cT1b, cN0, cM0, ER+, PR+, HER2- - Signed by [name], MD on 12/17/2020 Stage prefix: Initial diagnosis Laterality: Left Staged by: Pathologist and managing physician Stage used in treatment planning: Yes National guidelines used in treatment planning: Yes Type of national guideline used in treatment planning: NCCN   Malignant neoplasm of upper-outer quadrant of right breast in female, estrogen receptor positive ([place]) Staging form: Breast, AJCC 8th Edition - Clinical stage from 12/17/2020: cT2, cN1, cM0,  ER+, PR+, HER2- - Signed by [name], MD on 12/17/2020 Stage prefix: Initial diagnosis Laterality: Right Staged by: Pathologist and managing physician Stage used in treatment planning: Yes National guidelines used in treatment planning: Yes Type of national guideline used in treatment planning: NCCN  HPI     Past Medical History:  Diagnosis Date   Allergy    seasonal allergies   Anxiety    on meds   Asthma    uses inhaler   Breast cancer ([place]) 2012   RIGHT lumpectomy   Cancer ([place]) 2022   RIGHT breast lump-dx 2022   Depression    on meds   DVT (deep venous thrombosis) ([name]) 2010   after hysterectomy   Family history of uterine cancer    GERD (gastroesophageal reflux disease)    with certain foods/OTC PRN meds   Headache(784.0)    Hyperlipidemia    on meds   Hypertension    on meds    Patient Active Problem List   Diagnosis Date Noted   Port-A-Cath in place 01/27/2021   Genetic testing 12/24/2020   Family history of uterine cancer 12/17/2020   Malignant neoplasm of upper-outer quadrant of right breast in female, estrogen receptor positive ([place]) 12/12/2020   Malignant neoplasm of upper-outer quadrant of left breast in female, estrogen receptor positive ([place]) 12/12/2020   Hypokalemia 01/02/2012   Asthma 01/01/2012   Bradycardia 01/01/2012   Syncope 12/31/2011   Mediastinal adenopathy 12/31/2011   Anemia 12/31/2011   Chest pain 12/31/2011    Past Surgical History:  Procedure Laterality Date   ABDOMINAL HYSTERECTOMY     BREAST EXCISIONAL BIOPSY  Right 09/16/2009   BREAST SURGERY     lumpectomy   PORTACATH PLACEMENT Left 01/26/2021   Procedure: INSERTION PORT-A-CATH;  Surgeon: [name], MD;  Location: [facility] ORS;  Service: General;  Laterality: Left;   TUBAL LIGATION       OB History     Gravida  6   Para      Term      Preterm      AB      Living         SAB      IAB      Ectopic      Multiple      Live Births  5            Family History  Problem Relation Age of Onset   Cancer Mother        ?, dx <50   Hypertension Father    Cancer Maternal Aunt        uterine vs ovarian, dx <50   Cancer Maternal Aunt        unknown type, dx <50   Uterine cancer Cousin 6   Colon polyps Neg Hx    Colon cancer Neg Hx    Esophageal cancer Neg Hx    Stomach cancer Neg Hx     Social History   Tobacco Use   Smoking status: Never   Smokeless tobacco: Never  Vaping Use   Vaping Use: Never used  Substance Use Topics   Alcohol use: Not Currently    Alcohol/week: 7.0 standard drinks    Types: 7 Standard drinks or equivalent per week    Comment: occassional   Drug use: No    Home Medications Prior to Admission medications   Medication Sig Start Date End Date Taking? Authorizing Provider  acetaminophen (TYLENOL) 500 MG tablet Take 2 tablets (1,000 mg total) by mouth every 6 (six) hours as needed. This is maximum dose of tylenol daily. Do not exceed this dose of tylenol from all sources (check other over the counter and prescribed medications) Patient taking differently: Take 3,000 mg by mouth every 6 (six) hours as needed for mild pain. 12/29/16  Yes Gareth Morgan, MD  albuterol (VENTOLIN HFA) 108 (90 Base) MCG/ACT inhaler Inhale 2 puffs into the lungs every 6 (six) hours as needed for wheezing or shortness of breath. 10/06/20  Yes [name], NP  amLODipine (NORVASC) 10 MG tablet Take 1 tablet (10 mg total) by mouth daily. Patient taking differently: Take 10 mg by mouth every evening. 10/06/20  Yes [name], NP  aspirin 500 MG tablet Take 1,000 mg by mouth every 6 (six) hours as needed for pain.   Yes [provider]  atorvastatin (LIPITOR) 40 MG tablet Take 1 tablet (40 mg total) by mouth daily. Patient taking differently: Take 40 mg by mouth every evening. 10/13/20  Yes [name], NP  Cholecalciferol (VITAMIN D3) 2000 units capsule Take 2,000 Units by mouth daily. 11/28/16   Yes [provider]  dexamethasone (DECADRON) 4 MG tablet Take 2 tablets (8 mg total) by mouth daily. Take daily for 3 days after chemo. Take with food. 01/21/21  Yes Magrinat, Virgie Dad, MD  diazepam (VALIUM) 5 MG tablet Take 2 tablets (10 mg total) by mouth once for 1 dose 01/16/21  Yes   escitalopram (LEXAPRO) 20 MG tablet Take 1 tablet (20 mg total) by mouth daily. 10/06/20  Yes [name], NP  hydrochlorothiazide (HYDRODIURIL) 25  MG tablet Take 1 tablet (25 mg total) by mouth daily. 10/06/20  Yes [name], NP  montelukast (SINGULAIR) 10 MG tablet Take 1 tablet (10 mg total) by mouth at bedtime. Patient taking differently: Take 10 mg by mouth at bedtime as needed (allergies). 10/06/20  Yes [name], NP  Multiple Vitamin (MULTIVITAMIN) tablet Take 2 tablets by mouth daily.   Yes [provider]  prochlorperazine (COMPAZINE) 10 MG tablet Take 1 tablet (10 mg total) by mouth every 6 (six) hours as needed (Nausea or vomiting). 01/21/21  Yes Magrinat, Virgie Dad, MD  promethazine (PHENERGAN) 25 MG suppository Unwrap and insert 1 suppository (25 mg total) rectally every 8 (eight) hours as needed for up to 6 doses for nausea or vomiting. 01/28/21  Yes Mayci Haning, Carola Rhine, MD  traMADol (ULTRAM) 50 MG tablet Take 1 tablet by mouth every 6 hours as needed for moderate pain or severe pain. 01/26/21  Yes [name], MD  lidocaine-prilocaine (EMLA) cream Apply to affected area once as directed 01/21/21   Magrinat, Virgie Dad, MD    Allergies    Patient has no known allergies.  Review of Systems   Review of Systems  Constitutional:  Positive for chills, fatigue and fever.  Eyes:  Negative for pain and visual disturbance.  Respiratory:  Negative for cough and shortness of breath.   Cardiovascular:  Negative for chest pain and palpitations.  Gastrointestinal:  Positive for abdominal pain, nausea and vomiting.  Genitourinary:  Negative for dysuria and hematuria.   Musculoskeletal:  Negative for arthralgias and back pain.  Skin:  Negative for color change and rash.  Neurological:  Negative for syncope and headaches.  All other systems reviewed and are negative.  Physical Exam Updated Vital Signs BP (!) 142/82   Pulse (!) 56   Temp 97.8 F (36.6 C)   Resp 19   Ht _0  (1.803 m)   Wt 99 kg   SpO2 99%   BMI 30.44 kg/m   Physical Exam Constitutional:      General: She is not in acute distress. HENT:     Head: Normocephalic and atraumatic.     Comments: No bleeding orophayngeal lesion visualized Eyes:     Conjunctiva/sclera: Conjunctivae normal.     Pupils: Pupils are equal, round, and reactive to light.  Cardiovascular:     Rate and Rhythm: Normal rate and regular rhythm.  Pulmonary:     Effort: Pulmonary effort is normal. No respiratory distress.  Abdominal:     General: There is no distension.     Tenderness: There is no abdominal tenderness.  Musculoskeletal:     Comments: Left chest wall buried port side, nonerythematous, covered in clean bandage  Skin:    General: Skin is warm and dry.  Neurological:     General: No focal deficit present.     Mental Status: She is alert. Mental status is at baseline.  Psychiatric:        Mood and Affect: Mood normal.        Behavior: Behavior normal.    ED Results / Procedures / Treatments   Labs (all labs ordered are listed, but only abnormal results are displayed) Labs Reviewed  COMPREHENSIVE METABOLIC PANEL - Abnormal; Notable for the following components:      Result Value   Glucose, Bld 113 (*)    All other components within normal limits  CBC WITH DIFFERENTIAL/PLATELET - Abnormal; Notable for the following components:   WBC 12.6 (*)  Hemoglobin 9.1 (*)    HCT 29.4 (*)    MCV 76.0 (*)    MCH 23.5 (*)    RDW 17.1 (*)    Neutro Abs 10.7 (*)    Abs Immature Granulocytes 0.17 (*)    All other components within normal limits  LIPASE, BLOOD  MAGNESIUM    EKG EKG  Interpretation  Date/Time:  Wednesday January 28 2021 09:13:11 EDT Ventricular Rate:  50 PR Interval:  122 QRS Duration: 104 QT Interval:  458 QTC Calculation: 417 R Axis:   39 Text Interpretation: Sinus bradycardia Otherwise normal ECG Confirmed by Octaviano Glow [phone]) on 01/28/2021 9:48:09 AM  Radiology DG Chest 2 View  Result Date: 01/28/2021 CLINICAL DATA:  Chest pain. Hematemesis. Question aspiration. Breast cancer. EXAM: CHEST - 2 VIEW COMPARISON:  01/26/2021 FINDINGS: Heart size is mildly enlarged. Mediastinal shadows are normal. Power port inserted from the left has its tip in the SVC above the right atrium. The lungs are clear. No infiltrate, collapse or effusion. No significant bone finding. IMPRESSION: No active cardiopulmonary disease. Electronically Signed   By: Nelson Chimes M.D.   On: 01/28/2021 09:56    Procedures Procedures   Medications Ordered in ED Medications  alum & mag hydroxide-simeth (MAALOX/MYLANTA) 200-200-20 MG/5ML suspension 30 mL (30 mLs Oral Given 01/28/21 0959)    And  lidocaine (XYLOCAINE) 2 % viscous mouth solution 15 mL (15 mLs Oral Given 01/28/21 0959)  famotidine (PEPCID) IVPB 20 mg premix (0 mg Intravenous Stopped 01/28/21 1401)  dexamethasone (DECADRON) injection 6 mg (6 mg Intravenous Given 01/28/21 1318)    ED Course  I have reviewed the triage vital signs and the nursing notes.  Pertinent labs & imaging results that were available during my care of the patient were reviewed by me and considered in my medical decision making (see chart for details).  This patient complains of nausea, vomiting.  This involves an extensive number of treatment options, and is a complaint that carries with it a high risk of complications and morbidity.  The differential diagnosis includes side effect of chemo vs gastritis vs other  I ordered, reviewed, and interpreted labs.  No significant findings.  Cr normal.  Hgb near baseline levels, 9.1 today.  Minor  leukocytosis 12.5 I suspect is reactive from vomiting I ordered medication GI cocktail for suspected gastritis, decadron IV, IV pepcid, with significant improvement of epigastric pain and nausea I ordered imaging studies which included dg chest I independently visualized and interpreted imaging which showed no life-threatening abnormalities, and the monitor tracing which showed sinus bradycardia  Patient had reportedly an orophyagneal mass noted on intubation recently for chest pain placement - no bleeding mass visualized in oral exam.  She is not swallowing blood and hgb is stable.  I don't think this is the cause of her symptoms and she has ENT follow up arranged.  I doubt sepsis, GI perforation, PE, PTX, or PNA clinically.   Clinical Course as of 01/28/21 1748  Wed Jan 28, 2021  1212 Patient remains asymptomatic, heart rate is sinus bradycardia, on review of her prior EKGs and medical records I do see sinus bradycardia in 2013 with a heart rate in the 40s, typical heart rate around 60 bpm.  She is not lightheaded and I doubt this is the cause of her symptoms [MT]  1229 Will attempt to page Dr Letta Pate [MT]  1312 I spoke to Dr Letta Pate who requested the patient be given her dexamethasone (which was prescribed for her  to take today, but she did not take it this morning).  Otherwise he can arrange to have her seen in the next 1 to 2 days in the office for reassessment.  I reassessed the patient and feels that she feels significantly better after her GI cocktail.  She was not given to Phenergan IV, but I can prescribe some rectal suppositories for home.  She did receive the Pepcid.  She tolerated p.o. here.  We discussed her sinus bradycardia.  I see records of this in the past, and I do not suspect she is symptomatic from this, but we can give her referral to cardiology.  Her heart rate remains in the high 50s when awake, drops into the high 40's while she is dozing. [MT]    Clinical Course User  Index [MT] Laszlo Ellerby, Carola Rhine, MD    Final Clinical Impression(s) / ED Diagnoses Final diagnoses:  Non-intractable vomiting with nausea, unspecified vomiting type  Bradycardia    Rx / DC Orders ED Discharge Orders          Ordered    Ambulatory referral to Cardiology       Comments: Bradycardia   01/28/21 1314    promethazine (PHENERGAN) 25 MG suppository  Every 8 hours PRN        01/28/21 1316             Makhia Vosler, Carola Rhine, MD 01/28/21 [phone]

## 2021-01-28 NOTE — ED Triage Notes (Signed)
Pt reports started chemo yesterday and started having bloody emesis , chills, fever at home, constipation LBM 1.5 week ago.

## 2021-01-28 NOTE — Discharge Instructions (Addendum)
We gave you steroids in the ER which were recommended by your oncologist.  Her nausea and vomiting improved medications as you have your stomach.  You are likely having a reaction to the chemo medications that were given to you.  It is important you keep drinking plenty of water at home.  I prescribed you some Phenergan suppository tablets which you can use for severe nausea at home.  If these do not work, or you have worsening vomiting, or notice more blood in your vomit, you need to come back to the ER.  You will need to follow-up with the ears nose and throat doctors for this bleeding mass noted in your throat.  You have a CT scan already ordered.  We also talked about your heart rate, which was slightly lower than normal.  This may be completely normal finding.  I do not see signs of heart attack or stress, and you had a normal echocardiogram 1 week ago.  However I thought it was reasonable to refer you to a cardiologist for this issue.  They should contact you within 2 business days.  If you do not hear from them, you can call the number above for heart care.  If you begin to feel lightheaded, begin having new chest pain or pressure, difficulty breathing, or a fever (temperature over 100.58F), please return to the ER.

## 2021-01-29 ENCOUNTER — Encounter: Payer: Self-pay | Admitting: *Deleted

## 2021-01-29 ENCOUNTER — Inpatient Hospital Stay: Payer: Medicaid Other

## 2021-01-29 ENCOUNTER — Inpatient Hospital Stay (HOSPITAL_BASED_OUTPATIENT_CLINIC_OR_DEPARTMENT_OTHER): Payer: Medicaid Other | Admitting: Oncology

## 2021-01-29 ENCOUNTER — Ambulatory Visit: Payer: Medicaid Other | Admitting: Adult Health

## 2021-01-29 ENCOUNTER — Other Ambulatory Visit: Payer: Self-pay

## 2021-01-29 ENCOUNTER — Other Ambulatory Visit: Payer: Self-pay | Admitting: *Deleted

## 2021-01-29 ENCOUNTER — Other Ambulatory Visit (HOSPITAL_COMMUNITY): Payer: Medicaid Other

## 2021-01-29 VITALS — BP 138/80 | HR 52 | Temp 98.2°F | Resp 16

## 2021-01-29 DIAGNOSIS — C50412 Malignant neoplasm of upper-outer quadrant of left female breast: Secondary | ICD-10-CM

## 2021-01-29 DIAGNOSIS — Z95828 Presence of other vascular implants and grafts: Secondary | ICD-10-CM

## 2021-01-29 DIAGNOSIS — Z5111 Encounter for antineoplastic chemotherapy: Secondary | ICD-10-CM | POA: Diagnosis not present

## 2021-01-29 DIAGNOSIS — Z170 Estrogen receptor positive status [ER+]: Secondary | ICD-10-CM

## 2021-01-29 DIAGNOSIS — C50411 Malignant neoplasm of upper-outer quadrant of right female breast: Secondary | ICD-10-CM | POA: Diagnosis not present

## 2021-01-29 DIAGNOSIS — R55 Syncope and collapse: Secondary | ICD-10-CM

## 2021-01-29 DIAGNOSIS — E860 Dehydration: Secondary | ICD-10-CM

## 2021-01-29 LAB — ESTRADIOL, ULTRA SENS: Estradiol, Sensitive: 78.8 pg/mL

## 2021-01-29 MED ORDER — ONDANSETRON HCL 4 MG/2ML IJ SOLN
8.0000 mg | Freq: Once | INTRAMUSCULAR | Status: AC
  Administered 2021-01-29: 8 mg via INTRAVENOUS
  Filled 2021-01-29: qty 4

## 2021-01-29 MED ORDER — SODIUM CHLORIDE 0.9 % IV SOLN
10.0000 mg | Freq: Once | INTRAVENOUS | Status: AC
  Administered 2021-01-29: 10 mg via INTRAVENOUS
  Filled 2021-01-29: qty 10

## 2021-01-29 MED ORDER — SODIUM CHLORIDE 0.9% FLUSH
10.0000 mL | Freq: Once | INTRAVENOUS | Status: AC
  Administered 2021-01-29: 10 mL
  Filled 2021-01-29: qty 10

## 2021-01-29 MED ORDER — HEPARIN SOD (PORK) LOCK FLUSH 100 UNIT/ML IV SOLN
500.0000 [IU] | Freq: Once | INTRAVENOUS | Status: AC
  Administered 2021-01-29: 500 [IU]
  Filled 2021-01-29: qty 5

## 2021-01-29 MED ORDER — SODIUM CHLORIDE 0.9 % IV SOLN
INTRAVENOUS | Status: DC
  Filled 2021-01-29: qty 250

## 2021-01-29 MED ORDER — PEGFILGRASTIM-CBQV 6 MG/0.6ML ~~LOC~~ SOSY
6.0000 mg | PREFILLED_SYRINGE | Freq: Once | SUBCUTANEOUS | Status: AC
  Administered 2021-01-29: 6 mg via SUBCUTANEOUS
  Filled 2021-01-29: qty 0.6

## 2021-01-29 MED ORDER — SODIUM CHLORIDE 0.9 % IV SOLN: 8.0000 mg | Freq: Once | INTRAVENOUS | Status: DC

## 2021-01-29 NOTE — Progress Notes (Signed)
[place]  [phone] [phone]     ID: Chelsea Hansen DOB: May 01, 1971  MR#: [id number]  JKK#:[id number]  Patient Care Team: [name], NP as PCP - General (Internal Medicine) [name], RN as Oncology Nurse Navigator [name], RN as Oncology Nurse Navigator [name], MD as Consulting Physician (Radiation Oncology) Morris Longenecker, Virgie Dad, MD as Consulting Physician (Oncology) [name], MD as Consulting Physician (General Surgery) [name], MD OTHER MD:  CHIEF COMPLAINT: Bilateral estrogen receptor positive breast cancer  CURRENT TREATMENT:  neoadjuvant chemotherapy   INTERVAL HISTORY: Chelsea Hansen had a very rough time with her first cycle of chemotherapy.  It appears she never filled her prescriptions for dexamethasone and Compazine.  On day 2 she presented to the emergency room with bloody vomitus.  She was treated supportively and released.  She called today complaining of continuing nausea and vomiting problems.  We asked her to come in for fluids.  REVIEW OF SYSTEMS: [name] tells me she is still has not been contacted by the ENT office for evaluation of her oral mass.  She is scheduled for CT of the head but her CT of the soft tissues of the neck apparently has not been scheduled yet.  She tells me the bleeding from the mouth is down even though she is still nauseated.  She feels very dehydrated as well.   COVID 19 VACCINATION STATUS: Novinger x2, most recently 10/2020, no booster as of June 2022   HISTORY OF CURRENT ILLNESS: From the original intake note:  Chelsea Hansen has a history of excisional biopsy of a fibroadenoma from the lower outer right breast in 2011.  More recently she noted a knot in her right breast.  She brought it to medical attention and proceeded to bilateral diagnostic mammography with tomography and bilateral breast ultrasonography at [facility] on 11/18/2020 showing: breast  density category B;   RIGHT: 3.2 cm mass associated with architectural distortion and scattered calcifications in upper-outer right breast at 10 o'clock, corresponding to palpable concern; indeterminate 1.1 cm mass in upper-outer right breast at 9:30; solitary pathologic right axillary lymph node with eccentric cortical thickening up to 7 mm. LEFT: 0.8 cm mass involving upper-outer left breast at 2 o'clock; no pathologic left axillary lymphadenopathy.  Accordingly on 12/03/2020 she proceeded to biopsy of the bilateral breast areas in question. The pathology from this procedure ([id number]) showed:  Right Breast, 9:30 fibroadenoma Right Breast, 10 o'clock Invasive and in situ mammary carcinoma, e-cadherin negative, grade 2 Prognostic indicators significant for: estrogen receptor, 95% positive and progesterone receptor, 100% positive, both with strong staining intensity. Proliferation marker Ki67 at 40%. HER2 negative by immunohistochemistry (0). 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 Grade or-2  Prognostic indicators significant for: estrogen receptor, 95% positive and progesterone receptor, 100% positive, both with strong staining intensity. Proliferation marker Ki67 at 15%. HER2 negative by immunohistochemistry (1+).  Cancer Staging Malignant neoplasm of upper-outer quadrant of left breast in female, estrogen receptor positive ([place]) Staging form: Breast, AJCC 8th Edition - Clinical stage from 12/17/2020: cT1b, cN0, cM0, ER+, PR+, HER2- - Signed by [name], MD on 12/17/2020 Stage prefix: Initial diagnosis Laterality: Left Staged by: Pathologist and managing physician Stage used in treatment planning: Yes National guidelines used in treatment planning: Yes Type of national guideline used  in treatment planning:  NCCN  Malignant neoplasm of upper-outer quadrant of right breast in female, estrogen receptor positive ([place]) Staging form: Breast, AJCC 8th Edition - Clinical stage from 12/17/2020: cT2, cN1, cM0, ER+, PR+, HER2- - Signed by [name], MD on 12/17/2020 Stage prefix: Initial diagnosis Laterality: Right Staged by: Pathologist and managing physician Stage used in treatment planning: Yes National guidelines used in treatment planning: Yes Type of national guideline used in treatment planning: NCCN  The patient's subsequent history is as detailed below.   PAST MEDICAL HISTORY: Past Medical History:  Diagnosis Date   Allergy    seasonal allergies   Anxiety    on meds   Asthma    uses inhaler   Breast cancer ([place]) 2012   RIGHT lumpectomy   Cancer ([place]) 2022   RIGHT breast lump-dx 2022   Depression    on meds   DVT (deep venous thrombosis) ([place]) 2010   after hysterectomy   Family history of uterine cancer    GERD (gastroesophageal reflux disease)    with certain foods/OTC PRN meds   Headache(784.0)    Hyperlipidemia    on meds   Hypertension    on meds    PAST SURGICAL HISTORY: Past Surgical History:  Procedure Laterality Date   ABDOMINAL HYSTERECTOMY     BREAST EXCISIONAL BIOPSY Right 09/16/2009   BREAST SURGERY     lumpectomy   PORTACATH PLACEMENT Left 01/26/2021   Procedure: INSERTION PORT-A-CATH;  Surgeon: [name], MD;  Location: [facility];  Service: General;  Laterality: Left;   TUBAL LIGATION    Patient's ovaries are still in place, status post hysterectomy 08/18/2010 with benign pathology   FAMILY HISTORY: Family History  Problem Relation Age of Onset   Cancer Mother        ?, dx <50   Hypertension Father    Cancer Maternal Aunt        uterine vs ovarian, dx <50   Cancer Maternal Aunt        unknown type, dx <50   Uterine cancer Cousin 56   Colon polyps Neg Hx    Colon cancer Neg Hx    Esophageal cancer Neg Hx    Stomach cancer Neg Hx     Her father is age 79 and her mother age 66 as of 11/2020. Her mother has a history of cancer (type unsure).  The patient's mother had two sisters also with cancer, one with uterine and the other cancer type is unknown to the patient. Darinda has 1 brother and 1 sister.    GYNECOLOGIC HISTORY:  No LMP recorded. Patient has had a hysterectomy. Menarche: 50 years old Age at first live birth: 50 years old Hand P 5 LMP 2012, with hysterectomy Contraceptive never used HRT never used  Hysterectomy? Yes, 07/2010 for fibroids (pathology in Epic) BSO? 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 She is single and lives at home with her three sons. Her two daughters also live here in [place]. Daughter Trixie Maclaren, age 17, works at [employer]. Daughter Jilda Panda Sample, age 67, and son Isiah Nils Pyle, age 5, work at [employer]. Son Sharlee Blew, age 34, and daughter Alvera Novel, age 79, are currently not employed. Sharley is not a Designer, fashion/clothing.    ADVANCED DIRECTIVES: not in place   HEALTH MAINTENANCE: Social History   Tobacco Use   Smoking status: Never   Smokeless tobacco: Never  Vaping Use  Vaping Use: Never used  Substance Use Topics   Alcohol use: Not Currently    Alcohol/week: 7.0 standard drinks    Types: 7 Standard drinks or equivalent per week    Comment: occassional   Drug use: No     Colonoscopy: 10/2020 (Dr. Fuller Plan), recall 2027  PAP: 05/2010, negative, repeat not indicated (s/p hysterectomy)  Bone density: n/a (age)   No Known Allergies  Current Outpatient Medications  Medication Sig Dispense Refill   acetaminophen (TYLENOL) 500 MG tablet Take 2 tablets (1,000 mg total) by mouth every 6 (six) hours as needed. This is maximum dose of tylenol daily. Do not exceed this dose of tylenol from all sources (check other over the counter and prescribed medications) (Patient taking differently: Take 3,000 mg by mouth every 6 (six)  hours as needed for mild pain.) 30 tablet 0   albuterol (VENTOLIN HFA) 108 (90 Base) MCG/ACT inhaler Inhale 2 puffs into the lungs every 6 (six) hours as needed for wheezing or shortness of breath. 18 g 2   amLODipine (NORVASC) 10 MG tablet Take 1 tablet (10 mg total) by mouth daily. (Patient taking differently: Take 10 mg by mouth every evening.) 90 tablet 1   aspirin 500 MG tablet Take 1,000 mg by mouth every 6 (six) hours as needed for pain.     atorvastatin (LIPITOR) 40 MG tablet Take 1 tablet (40 mg total) by mouth daily. (Patient taking differently: Take 40 mg by mouth every evening.) 90 tablet 3   Cholecalciferol (VITAMIN D3) 2000 units capsule Take 2,000 Units by mouth daily.  11   dexamethasone (DECADRON) 4 MG tablet Take 2 tablets (8 mg total) by mouth daily. Take daily for 3 days after chemo. Take with food. 30 tablet 1   diazepam (VALIUM) 5 MG tablet Take 2 tablets (10 mg total) by mouth once for 1 dose 2 tablet 0   escitalopram (LEXAPRO) 20 MG tablet Take 1 tablet (20 mg total) by mouth daily. 90 tablet 1   hydrochlorothiazide (HYDRODIURIL) 25 MG tablet Take 1 tablet (25 mg total) by mouth daily. 90 tablet 1   lidocaine-prilocaine (EMLA) cream Apply to affected area once as directed 30 g 3   montelukast (SINGULAIR) 10 MG tablet Take 1 tablet (10 mg total) by mouth at bedtime. (Patient taking differently: Take 10 mg by mouth at bedtime as needed (allergies).) 90 tablet 3   Multiple Vitamin (MULTIVITAMIN) tablet Take 2 tablets by mouth daily.     prochlorperazine (COMPAZINE) 10 MG tablet Take 1 tablet (10 mg total) by mouth every 6 (six) hours as needed (Nausea or vomiting). 30 tablet 1   promethazine (PHENERGAN) 25 MG suppository Unwrap and insert 1 suppository (25 mg total) rectally every 8 (eight) hours as needed for up to 6 doses for nausea or vomiting. 6 each 0   traMADol (ULTRAM) 50 MG tablet Take 1 tablet by mouth every 6 hours as needed for moderate pain or severe pain. 30 tablet 0    No current facility-administered medications for this visit.    OBJECTIVE: African-American woman examined in the infusion area  For vitals associated with the 01/29/2021 visit please see the infusion area flowsheet.  There were no vitals filed for this visit.  For vitals associated with the 01/27/2021 visit please see the infusion area flowsheet     There is no height or weight on file to calculate BMI.   Wt Readings from Last 3 Encounters:  01/28/21 218 lb 4.1 oz (99 kg)  01/27/21 218 lb 11.1 oz (99.2 kg)  01/15/21 221 lb 1.6 oz (100.3 kg)     ECOG FS:1 - Symptomatic but completely ambulatory  Sclerae unicteric, EOMs intact In the posterior aspect of the left maxilla there is a mass which is not currently bleeding or ulcerated.  It measures approximately 2 cm Lungs no rales or rhonchi Heart regular rate and rhythm Abd soft, nontender, positive bowel sounds Breasts: Deferred   LAB RESULTS:  CMP     Component Value Date/Time   NA 138 01/28/2021 0854   NA 139 10/06/2020 1432   K 4.2 01/28/2021 0854   CL 103 01/28/2021 0854   CO2 24 01/28/2021 0854   GLUCOSE 113 (H) 01/28/2021 0854   BUN 18 01/28/2021 0854   BUN 11 10/06/2020 1432   CREATININE 0.68 01/28/2021 0854   CREATININE 0.91 12/17/2020 0831   CALCIUM 9.4 01/28/2021 0854   PROT 7.6 01/28/2021 0854   PROT 6.9 10/06/2020 1432   ALBUMIN 3.7 01/28/2021 0854   ALBUMIN 4.1 10/06/2020 1432   AST 19 01/28/2021 0854   AST 15 12/17/2020 0831   ALT 17 01/28/2021 0854   ALT 18 12/17/2020 0831   ALKPHOS 87 01/28/2021 0854   BILITOT 0.3 01/28/2021 0854   BILITOT 0.3 12/17/2020 0831   GFRNONAA >60 01/28/2021 0854   GFRNONAA >60 12/17/2020 0831   GFRAA >60 09/07/2019 1340    Lab Results  Component Value Date   TOTALPROTELP 7.0 12/31/2011   ALBUMINELP 56.4 12/31/2011   A1GS 4.3 12/31/2011   A2GS 10.4 12/31/2011   BETS 6.4 12/31/2011   BETA2SER 4.6 12/31/2011   GAMS 17.9 12/31/2011   MSPIKE NOT DETECTED  12/31/2011   SPEI (NOTE) 12/31/2011    Lab Results  Component Value Date   WBC 12.6 (H) 01/28/2021   NEUTROABS 10.7 (H) 01/28/2021   HGB 9.1 (L) 01/28/2021   HCT 29.4 (L) 01/28/2021   MCV 76.0 (L) 01/28/2021   PLT 387 01/28/2021    No results found for: LABCA2  No components found for: [id number]  No results for input(s): INR in the last 168 hours.  No results found for: LABCA2  No results found for: CBJ628  No results found for: BTD176  No results found for: HYW737  No results found for: CA2729  No components found for: HGQUANT  No results found for: CEA1 / No results found for: CEA1   No results found for: AFPTUMOR  No results found for: [facility]  Lab Results  Component Value Date   KAPLAMBRATIO 10.08 12/31/2011   (kappa/lambda light chains)  No results found for: HGBA, HGBA2QUANT, HGBFQUANT, HGBSQUAN (Hemoglobinopathy evaluation)   Lab Results  Component Value Date   LDH 194 12/31/2011    Lab Results  Component Value Date   IRON 95 12/31/2011   TIBC 325 12/31/2011   IRONPCTSAT 29 12/31/2011   (Iron and TIBC)  Lab Results  Component Value Date   FERRITIN 37 12/31/2011    Urinalysis    Component Value Date/Time   COLORURINE YELLOW 09/07/2019 1341   APPEARANCEUR HAZY (A) 09/07/2019 1341   LABSPEC 1.021 09/07/2019 1341   PHURINE 9.0 (H) 09/07/2019 1341   GLUCOSEU NEGATIVE 09/07/2019 1341   HGBUR NEGATIVE 09/07/2019 1341   BILIRUBINUR NEGATIVE 09/07/2019 1341   KETONESUR NEGATIVE 09/07/2019 1341   PROTEINUR 30 (A) 09/07/2019 1341   UROBILINOGEN 0.2 11/01/2013 1246   NITRITE NEGATIVE 09/07/2019 1341   LEUKOCYTESUR NEGATIVE 09/07/2019 1341    STUDIES: DG Chest 2 View  Result Date: 01/28/2021 CLINICAL DATA:  Chest pain. Hematemesis. Question aspiration. Breast cancer. EXAM: CHEST - 2 VIEW COMPARISON:  01/26/2021 FINDINGS: Heart size is mildly enlarged. Mediastinal shadows are normal. Power port inserted from the left has its tip in the  SVC above the right atrium. The lungs are clear. No infiltrate, collapse or effusion. No significant bone finding. IMPRESSION: No active cardiopulmonary disease. Electronically Signed   By: Nelson Chimes M.D.   On: 01/28/2021 09:56   DG CHEST PORT 1 VIEW  Result Date: 01/26/2021 CLINICAL DATA:  Status post left Port-A-Cath placement. EXAM: PORTABLE CHEST 1 VIEW COMPARISON:  May 28, 2020. FINDINGS: Stable cardiomegaly. Interval placement of left subclavian Port-A-Cath with distal tip in expected position of cavoatrial junction. No pneumothorax is noted. Lungs are clear. Bony thorax is unremarkable. IMPRESSION: Interval placement of left subclavian Port-A-Cath with distal tip in expected position of cavoatrial junction. Electronically Signed   By: Marijo Conception M.D.   On: 01/26/2021 11:19   DG C-Arm 1-60 Min-No Report  Result Date: 01/26/2021 Fluoroscopy was utilized by the requesting physician.  No radiographic interpretation.   ECHOCARDIOGRAM COMPLETE  Result Date: 01/22/2021    ECHOCARDIOGRAM REPORT   Patient Name:   KIMANI BEDOYA Date of Exam: 01/22/2021 Medical Rec #:  [id number]     Height:       71.0 in Accession #:    [id number]    Weight:       221.1 lb Date of Birth:  11/10/1970    BSA:          2.200 m Patient Age:    69 years      BP:           120/77 mmHg Patient Gender: F             HR:           66 bpm. Exam Location:  Inpatient Procedure: 2D Echo, Color Doppler, Cardiac Doppler and Strain Analysis Indications:    Pre-Chemo Evaluation  History:        Patient has prior history of Echocardiogram examinations, most                 recent 01/01/2012. Risk Factors:Breast Cancer.  Sonographer:    Raquel Sarna Senior RDCS Referring Phys: [facility] Comments: Technically difficult due to poor echo windows. IMPRESSIONS  1. Left ventricular ejection fraction, by estimation, is 60 to 65%. The left ventricle has normal function. The left ventricle has no regional wall motion  abnormalities. Left ventricular diastolic parameters were normal.  2. Right ventricular systolic function is normal. The right ventricular size is normal.  3. Left atrial size was mildly dilated.  4. The mitral valve is normal in structure. Trivial mitral valve regurgitation. No evidence of mitral stenosis.  5. The aortic valve was not well visualized. There is mild calcification of the aortic valve. Aortic valve regurgitation is not visualized. No aortic stenosis is present.  6. The inferior vena cava is normal in size with greater than 50% respiratory variability, suggesting right atrial pressure of 3 mmHg. FINDINGS  Left Ventricle: Left ventricular ejection fraction, by estimation, is 60 to 65%. The left ventricle has normal function. The left ventricle has no regional wall motion abnormalities. The left ventricular internal cavity size was normal in size. There is  no left ventricular hypertrophy. Left ventricular diastolic parameters were normal. Right Ventricle: The right ventricular size is normal. No increase in right ventricular wall  thickness. Right ventricular systolic function is normal. Left Atrium: Left atrial size was mildly dilated. Right Atrium: Right atrial size was normal in size. Pericardium: There is no evidence of pericardial effusion. Mitral Valve: The mitral valve is normal in structure. Trivial mitral valve regurgitation. No evidence of mitral valve stenosis. Tricuspid Valve: The tricuspid valve is normal in structure. Tricuspid valve regurgitation is trivial. No evidence of tricuspid stenosis. Aortic Valve: The aortic valve was not well visualized. There is mild calcification of the aortic valve. Aortic valve regurgitation is not visualized. No aortic stenosis is present. Pulmonic Valve: The pulmonic valve was normal in structure. Pulmonic valve regurgitation is not visualized. No evidence of pulmonic stenosis. Aorta: The aortic root is normal in size and structure. Venous: The inferior  vena cava is normal in size with greater than 50% respiratory variability, suggesting right atrial pressure of 3 mmHg. IAS/Shunts: No atrial level shunt detected by color flow Doppler.  LEFT VENTRICLE PLAX 2D LVIDd:         4.80 cm  Diastology LVIDs:         3.30 cm  LV e' medial:    6.74 cm/s LV PW:         1.20 cm  LV E/e' medial:  11.3 LV IVS:        1.10 cm  LV e' lateral:   8.16 cm/s LVOT diam:     2.00 cm  LV E/e' lateral: 9.4 LV SV:         77 LV SV Index:   35       2D Longitudinal Strain LVOT Area:     3.14 cm 2D Strain GLS Avg:     -20.8 %  RIGHT VENTRICLE RV S prime:     12.60 cm/s TAPSE (M-mode): 2.5 cm LEFT ATRIUM             Index       RIGHT ATRIUM           Index LA diam:        3.60 cm 1.64 cm/m  RA Area:     14.20 cm LA Vol (A2C):   50.3 ml 22.86 ml/m RA Volume:   32.80 ml  14.91 ml/m LA Vol (A4C):   58.7 ml 26.68 ml/m LA Biplane Vol: 55.8 ml 25.36 ml/m  AORTIC VALVE LVOT Vmax:   120.00 cm/s LVOT Vmean:  85.000 cm/s LVOT VTI:    0.245 m  AORTA Ao Root diam: 3.40 cm Ao Asc diam:  3.70 cm MITRAL VALVE MV Area (PHT): 3.16 cm    SHUNTS MV Decel Time: 240 msec    Systemic VTI:  0.24 m MV E velocity: 76.30 cm/s  Systemic Diam: 2.00 cm MV A velocity: 75.40 cm/s MV E/A ratio:  1.01 Glori Bickers MD Electronically signed by Glori Bickers MD Signature Date/Time: 01/22/2021/2:57:06 PM    Final      ELIGIBLE FOR AVAILABLE RESEARCH PROTOCOL: no  ASSESSMENT: 50 y.o. [place] woman status post bilateral breast biopsies 12/03/2020, showing  (1) on the right, a clinical T2 N1, stage IIa invasive lobular carcinoma, grade 2, estrogen and progesterone receptor strongly positive, HER2 not amplified, with an MIB-1 of 40%   (a) the biopsied right axillary lymph node was positive with extracapsular extension   (b) a second right breast mass also biopsied was a fibroadenoma, concordant   (c) biopsy of an area of non-mass-like enhancement in the upper right breast pending   (2) on the left, a  clinical T1b N0, stage IA invasive ductal carcinoma, grade 1 or 2, estrogen and progesterone receptor positive, HER2 not amplified, with an MIB 1 of 15%   (a) biopsy of a 0.4 cm enhancement in the medial left breast pending  (3) genetics testing through the [facility] BRCAplus panel (report date 12/24/2020) or the CancerNext-Expanded + RNAinsight panel (report date 12/31/2020) found no deleterious mutations in ATM, BRCA1, BRCA2, CDH1, CHEK2, PALB2, PTEN, and TP53. The CancerNext-Expanded + RNAinsight gene panel offered by [employer] and includes sequencing and rearrangement analysis for the following 77 genes: AIP, ALK, APC, ATM, AXIN2, BAP1, BARD1, BLM, BMPR1A, BRCA1, BRCA2, BRIP1, CDC73, CDH1, CDK4, CDKN1B, CDKN2A, CHEK2, CTNNA1, DICER1, FANCC, FH, FLCN, GALNT12, KIF1B, LZTR1, MAX, MEN1, MET, MLH1, MSH2, MSH3, MSH6, MUTYH, NBN, NF1, NF2, NTHL1, PALB2, PHOX2B, PMS2, POT1, PRKAR1A, PTCH1, PTEN, RAD51C, RAD51D, RB1, RECQL, RET, SDHA, SDHAF2, SDHB, SDHC, SDHD, SMAD4, SMARCA4, SMARCB1, SMARCE1, STK11, SUFU, TMEM127, TP53, TSC1, TSC2, VHL and XRCC2 (sequencing and deletion/duplication); EGFR, EGLN1, HOXB13, KIT, MITF, PDGFRA, POLD1 and POLE (sequencing only); EPCAM and GREM1 (deletion/duplication only). RNA data is routinely analyzed for use in variant interpretation for all genes.  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  (5) definitive surgery pending  (6) chemotherapy consisting of doxorubicin and cyclophosphamide in dose dense fashion x4 started 01/27/2021 to be followed by weekly paclitaxel x12.  (7) adjuvant radiation on the right, possibly also on the left  (8) antiestrogens to follow at the completion of local treatment  (9) oral mass noted at time of intubation for port placement:  (A) CT  head and neck  (B) ENT evaluation   PLAN: [name] is now day 3 cycle 1 of cyclophosphamide and doxorubicin.  She never filled out her supportive medications and it could be that we did not impress that clearly enough to her.  At any rate I went over all that today with her and she has written instructions on how to take the dexamethasone and Compazine.  We also wrote for Phenergan suppositories in case she cannot get those other pills down.  Finally she needs to take Claritin because she received her backfill grasped him today and she is likely to have bone aches for the next couple of days otherwise.  I suggested moving her next treatment back a week but she is very motivated and is hoping by receiving fluids again tomorrow and then reviewing everything with me next week she will be able to get treated on day 15 as planned  Unless she feels completely well tomorrow she will return for more fluids and antinausea medication.  If she has any other issues at that time she will let us know  Total encounter time today 25 minutes.Sarajane Jews C. Mikeala Girdler, MD 01/29/2021 6:33 PM Medical Oncology and Hematology [facility] [place], [place] [address] Tel. [phone]    Fax. [phone]   This document serves as a record of services personally performed by Lurline Del, MD. It was created on his behalf by Wilburn Mylar, a trained medical scribe. The creation of this record is based on the scribe's personal observations and the provider's statements to them.   I, Lurline Del MD, have reviewed the above documentation for accuracy and completeness, and I agree with the above.   *Total  Encounter Time as defined by the Centers for Medicare and Medicaid Services includes, in addition to the face-to-face time of a patient visit (documented in the note above) non-face-to-face time: obtaining and reviewing outside history, ordering and reviewing medications, tests or  procedures, care coordination (communications with other health care professionals or caregivers) and documentation in the medical record.

## 2021-01-29 NOTE — Patient Instructions (Signed)
[facility]  Discharge Instructions: Thank you for choosing [facility] to provide your oncology and hematology care.   If you have a lab appointment with the [facility], please go directly to the [facility] and check in at the registration area.   Wear comfortable clothing and clothing appropriate for easy access to any Portacath or PICC line.   We strive to give you quality time with your provider. You may need to reschedule your appointment if you arrive late (15 or more minutes).  Arriving late affects you and other patients whose appointments are after yours.  Also, if you miss three or more appointments without notifying the office, you may be dismissed from the clinic at the provider's discretion.      For prescription refill requests, have your pharmacy contact our office and allow 72 hours for refills to be completed.    Today you received the following chemotherapy and/or immunotherapy agents IV hydration      To help prevent nausea and vomiting after your treatment, we encourage you to take your nausea medication as directed.  BELOW ARE SYMPTOMS THAT SHOULD BE REPORTED IMMEDIATELY: *FEVER GREATER THAN 100.4 F (38 C) OR HIGHER *CHILLS OR SWEATING *NAUSEA AND VOMITING THAT IS NOT CONTROLLED WITH YOUR NAUSEA MEDICATION *UNUSUAL SHORTNESS OF BREATH *UNUSUAL BRUISING OR BLEEDING *URINARY PROBLEMS (pain or burning when urinating, or frequent urination) *BOWEL PROBLEMS (unusual diarrhea, constipation, pain near the anus) TENDERNESS IN MOUTH AND THROAT WITH OR WITHOUT PRESENCE OF ULCERS (sore throat, sores in mouth, or a toothache) UNUSUAL RASH, SWELLING OR PAIN  UNUSUAL VAGINAL DISCHARGE OR ITCHING   Items with * indicate a potential emergency and should be followed up as soon as possible or go to the Emergency Department if any problems should occur.  Please show the CHEMOTHERAPY ALERT CARD or IMMUNOTHERAPY ALERT CARD at check-in  to the Emergency Department and triage nurse.  Should you have questions after your visit or need to cancel or reschedule your appointment, please contact [facility]  Dept: [phone]  and follow the prompts.  Office hours are 8:00 a.m. to 4:30 p.m. Monday - Friday. Please note that voicemails left after 4:00 p.m. may not be returned until the following business day.  We are closed weekends and major holidays. You have access to a nurse at all times for urgent questions. Please call the main number to the clinic Dept: [phone] and follow the prompts.   For any non-urgent questions, you may also contact your provider using MyChart. We now offer e-Visits for anyone 47 and older to request care online for non-urgent symptoms. For details visit [email or web address].   Also download the MyChart app! Go to the app store, search "MyChart", open the app, select [facility], and log in with your MyChart username and password.  Due to Covid, a mask is required upon entering the hospital/clinic. If you do not have a mask, one will be given to you upon arrival. For doctor visits, patients may have 1 support person aged 75 or older with them. For treatment visits, patients cannot have anyone with them due to current Covid guidelines and our immunocompromised population.

## 2021-01-29 NOTE — Telephone Encounter (Signed)
Can you please call to see what is needed? Thank you.

## 2021-01-30 ENCOUNTER — Other Ambulatory Visit: Payer: Self-pay | Admitting: *Deleted

## 2021-01-30 ENCOUNTER — Inpatient Hospital Stay: Payer: Medicaid Other

## 2021-01-30 ENCOUNTER — Encounter: Payer: Self-pay | Admitting: *Deleted

## 2021-01-30 VITALS — BP 147/79 | HR 52 | Temp 98.4°F | Resp 17

## 2021-01-30 DIAGNOSIS — C50411 Malignant neoplasm of upper-outer quadrant of right female breast: Secondary | ICD-10-CM

## 2021-01-30 DIAGNOSIS — Z5111 Encounter for antineoplastic chemotherapy: Secondary | ICD-10-CM | POA: Diagnosis not present

## 2021-01-30 MED ORDER — ONDANSETRON HCL 4 MG/2ML IJ SOLN
INTRAMUSCULAR | Status: AC
  Filled 2021-01-30: qty 4

## 2021-01-30 MED ORDER — SODIUM CHLORIDE 0.9 % IV SOLN
10.0000 mg | Freq: Once | INTRAVENOUS | Status: AC
  Administered 2021-01-30: 10 mg via INTRAVENOUS
  Filled 2021-01-30: qty 10

## 2021-01-30 MED ORDER — SODIUM CHLORIDE 0.9 % IV SOLN
INTRAVENOUS | Status: DC
  Filled 2021-01-30 (×2): qty 250

## 2021-01-30 MED ORDER — ONDANSETRON HCL 4 MG/2ML IJ SOLN
8.0000 mg | Freq: Once | INTRAMUSCULAR | Status: AC
  Administered 2021-01-30: 8 mg via INTRAVENOUS

## 2021-01-30 MED ORDER — SODIUM CHLORIDE 0.9 % IV SOLN: 8.0000 mg | Freq: Once | INTRAVENOUS | Status: DC

## 2021-01-30 NOTE — Telephone Encounter (Signed)
Left message asking patient to return call to [facility] at [phone].  

## 2021-01-30 NOTE — Patient Instructions (Signed)

## 2021-02-02 ENCOUNTER — Other Ambulatory Visit: Payer: Self-pay

## 2021-02-02 ENCOUNTER — Telehealth: Payer: Self-pay | Admitting: Oncology

## 2021-02-02 ENCOUNTER — Ambulatory Visit (HOSPITAL_COMMUNITY)
Admission: RE | Admit: 2021-02-02 | Discharge: 2021-02-02 | Disposition: A | Discharge status: Home / Self Care | Payer: Medicaid Other | Source: Ambulatory Visit | Attending: Oncology | Admitting: Oncology

## 2021-02-02 ENCOUNTER — Telehealth: Payer: Self-pay | Admitting: Licensed Clinical Social Worker

## 2021-02-02 ENCOUNTER — Encounter (HOSPITAL_COMMUNITY): Payer: Self-pay

## 2021-02-02 ENCOUNTER — Telehealth (INDEPENDENT_AMBULATORY_CARE_PROVIDER_SITE_OTHER): Payer: Self-pay | Admitting: Primary Care

## 2021-02-02 DIAGNOSIS — J392 Other diseases of pharynx: Secondary | ICD-10-CM | POA: Insufficient documentation

## 2021-02-02 DIAGNOSIS — C50412 Malignant neoplasm of upper-outer quadrant of left female breast: Secondary | ICD-10-CM | POA: Diagnosis present

## 2021-02-02 DIAGNOSIS — Z170 Estrogen receptor positive status [ER+]: Secondary | ICD-10-CM | POA: Insufficient documentation

## 2021-02-02 DIAGNOSIS — C50411 Malignant neoplasm of upper-outer quadrant of right female breast: Secondary | ICD-10-CM | POA: Insufficient documentation

## 2021-02-02 MED ORDER — IOHEXOL 350 MG/ML SOLN
75.0000 mL | Freq: Once | INTRAVENOUS | Status: AC | PRN
  Administered 2021-02-02: 75 mL via INTRAVENOUS

## 2021-02-02 MED ORDER — HEPARIN SOD (PORK) LOCK FLUSH 100 UNIT/ML IV SOLN
INTRAVENOUS | Status: AC
  Filled 2021-02-02: qty 5

## 2021-02-02 MED ORDER — HEPARIN SOD (PORK) LOCK FLUSH 100 UNIT/ML IV SOLN
500.0000 [IU] | Freq: Once | INTRAVENOUS | Status: AC
  Administered 2021-02-02: 500 [IU] via INTRAVENOUS

## 2021-02-02 NOTE — Telephone Encounter (Signed)
Chelsea Hansen  TC to [employer] son, Chelsea Hansen, per message from [place], [place] with [employer].  Per Chelsea Hansen, pt is having trouble completing applications for breast cancer foundations. CSW will meet with pt after her appt tomorrow to Hansen on applications and discuss what supporting documents are needed.  Son also asked about having application for social security disability. Patient needs to apply through Social Security administration and/or online. Son voiced understanding and knowledge of how to do this.  No other needs at this time.   Christeen Douglas, LCSW

## 2021-02-02 NOTE — Telephone Encounter (Signed)
Patient has been contacted three times to ask what concerns or need she has. Voicemail left asking patient to return call if she still needed to speak with someone.

## 2021-02-02 NOTE — Telephone Encounter (Signed)
Scheduled appts per 8/12 sch msg. Called pt, no answer. Left msg with appts date and times.

## 2021-02-02 NOTE — Telephone Encounter (Signed)
Pt stated she needs a walker with a seat and a cane due to falling and balance issues / pt also needs a Dr note stating that she can not work at all right now due to Chemo asap/ pt also needs to speak with someone about financial assistance / pt will be at the hospital tomorrow at 2pm /stated she misses the office calls due to being at appts and in the hospital / please call asap to advise

## 2021-02-03 ENCOUNTER — Inpatient Hospital Stay (HOSPITAL_BASED_OUTPATIENT_CLINIC_OR_DEPARTMENT_OTHER): Payer: Medicaid Other | Admitting: Oncology

## 2021-02-03 ENCOUNTER — Encounter: Payer: Self-pay | Admitting: Pharmacist

## 2021-02-03 ENCOUNTER — Other Ambulatory Visit: Payer: Self-pay | Admitting: Pharmacist

## 2021-02-03 ENCOUNTER — Inpatient Hospital Stay: Payer: Medicaid Other | Admitting: Licensed Clinical Social Worker

## 2021-02-03 ENCOUNTER — Other Ambulatory Visit (HOSPITAL_COMMUNITY): Payer: Self-pay

## 2021-02-03 ENCOUNTER — Inpatient Hospital Stay: Payer: Medicaid Other

## 2021-02-03 ENCOUNTER — Encounter: Payer: Self-pay | Admitting: Licensed Clinical Social Worker

## 2021-02-03 ENCOUNTER — Encounter: Payer: Self-pay | Admitting: *Deleted

## 2021-02-03 VITALS — BP 127/76 | HR 64 | Temp 97.7°F | Resp 18 | Ht 71.0 in | Wt 218.8 lb

## 2021-02-03 DIAGNOSIS — Z95828 Presence of other vascular implants and grafts: Secondary | ICD-10-CM

## 2021-02-03 DIAGNOSIS — C50412 Malignant neoplasm of upper-outer quadrant of left female breast: Secondary | ICD-10-CM

## 2021-02-03 DIAGNOSIS — Z170 Estrogen receptor positive status [ER+]: Secondary | ICD-10-CM

## 2021-02-03 DIAGNOSIS — C50411 Malignant neoplasm of upper-outer quadrant of right female breast: Secondary | ICD-10-CM | POA: Diagnosis not present

## 2021-02-03 DIAGNOSIS — Z5111 Encounter for antineoplastic chemotherapy: Secondary | ICD-10-CM | POA: Diagnosis not present

## 2021-02-03 LAB — CBC WITH DIFFERENTIAL/PLATELET
Abs Immature Granulocytes: 0.03 10*3/uL (ref 0.00–0.07)
Basophils Absolute: 0 10*3/uL (ref 0.0–0.1)
Basophils Relative: 1 %
Eosinophils Absolute: 0.1 10*3/uL (ref 0.0–0.5)
Eosinophils Relative: 4 %
HCT: 25.4 % — ABNORMAL LOW (ref 36.0–46.0)
Hemoglobin: 8.1 g/dL — ABNORMAL LOW (ref 12.0–15.0)
Immature Granulocytes: 1 %
Lymphocytes Relative: 52 %
Lymphs Abs: 1.2 10*3/uL (ref 0.7–4.0)
MCH: 23.1 pg — ABNORMAL LOW (ref 26.0–34.0)
MCHC: 31.9 g/dL (ref 30.0–36.0)
MCV: 72.4 fL — ABNORMAL LOW (ref 80.0–100.0)
Monocytes Absolute: 0.1 10*3/uL (ref 0.1–1.0)
Monocytes Relative: 4 %
Neutro Abs: 0.9 10*3/uL — ABNORMAL LOW (ref 1.7–7.7)
Neutrophils Relative %: 38 %
Platelets: 315 10*3/uL (ref 150–400)
RBC: 3.51 MIL/uL — ABNORMAL LOW (ref 3.87–5.11)
RDW: 15.7 % — ABNORMAL HIGH (ref 11.5–15.5)
WBC: 2.3 10*3/uL — ABNORMAL LOW (ref 4.0–10.5)
nRBC: 0 % (ref 0.0–0.2)

## 2021-02-03 LAB — COMPREHENSIVE METABOLIC PANEL
ALT: 17 U/L (ref 0–44)
AST: 10 U/L — ABNORMAL LOW (ref 15–41)
Albumin: 3.2 g/dL — ABNORMAL LOW (ref 3.5–5.0)
Alkaline Phosphatase: 124 U/L (ref 38–126)
Anion gap: 9 (ref 5–15)
BUN: 13 mg/dL (ref 6–20)
CO2: 27 mmol/L (ref 22–32)
Calcium: 9 mg/dL (ref 8.9–10.3)
Chloride: 101 mmol/L (ref 98–111)
Creatinine, Ser: 0.77 mg/dL (ref 0.44–1.00)
GFR, Estimated: >60 mL/min (ref >60)
Glucose, Bld: 111 mg/dL — ABNORMAL HIGH (ref 70–99)
Potassium: 3.6 mmol/L (ref 3.5–5.1)
Sodium: 137 mmol/L (ref 135–145)
Total Bilirubin: 0.3 mg/dL (ref 0.3–1.2)
Total Protein: 6.4 g/dL — ABNORMAL LOW (ref 6.5–8.1)

## 2021-02-03 MED ORDER — TRAMADOL HCL 50 MG PO TABS
50.0000 mg | ORAL_TABLET | Freq: Four times a day (QID) | ORAL | 0 refills | Status: DC | PRN
  Filled 2021-02-03: qty 20, 5d supply, fill #0

## 2021-02-03 MED ORDER — DEXAMETHASONE 4 MG PO TABS
8.0000 mg | ORAL_TABLET | Freq: Two times a day (BID) | ORAL | 0 refills | Status: DC
  Filled 2021-02-03: qty 40, 10d supply, fill #0

## 2021-02-03 MED ORDER — ONDANSETRON HCL 8 MG PO TABS
8.0000 mg | ORAL_TABLET | Freq: Three times a day (TID) | ORAL | 0 refills | Status: DC | PRN
  Filled 2021-02-03: qty 20, 7d supply, fill #0

## 2021-02-03 MED ORDER — MISC. DEVICES MISC: 0 refills | Status: DC

## 2021-02-03 MED ORDER — HEPARIN SOD (PORK) LOCK FLUSH 100 UNIT/ML IV SOLN
500.0000 [IU] | Freq: Once | INTRAVENOUS | Status: AC
  Administered 2021-02-03: 500 [IU]

## 2021-02-03 MED ORDER — SODIUM CHLORIDE 0.9% FLUSH
10.0000 mL | Freq: Once | INTRAVENOUS | Status: AC
Start: 2021-02-03 — End: 2021-02-03
  Administered 2021-02-03: 10 mL

## 2021-02-03 MED ORDER — PROCHLORPERAZINE MALEATE 10 MG PO TABS
ORAL_TABLET | ORAL | 1 refills | Status: DC
  Filled 2021-02-03: qty 30, 30d supply, fill #0

## 2021-02-03 MED ORDER — OLANZAPINE 5 MG PO TABS
5.0000 mg | ORAL_TABLET | Freq: Every day | ORAL | 0 refills | Status: DC
  Filled 2021-02-03: qty 18, 18d supply, fill #0

## 2021-02-03 NOTE — Progress Notes (Signed)
  Oncology Clinical Pharmacist Practitioner Note  Chelsea Hansen is a 50 y.o. female with a diagnosis of breast cancer currently on dose dense AC under the care of Dr. Lurline Del.  Chelsea Hansen received cycle 1 on 01/27/21 and had several episodes of nausea and vomiting which ultimately led her to presenting to the ED on 01/28/21.  She was seen by Dr. Jana Hakim today and clinical pharmacy was asked for recommendations on her antiemetic regimen.  Olanzapine 5 mg PO was added as a premedication to her treatment plan and a communication order was entered detailing the changes.. An olanzapine prescription was also written for 5 mg by mouth at night on days 2, 3, 4 after chemotherapy while on dose dense AC. Chelsea Hansen was advised that she can take 10 mg by mouth nightly if nausea/vomiting is not stopped by the 5 mg dose. We also discussed to try to eat smaller, more frequent meals, avoid foods that are greasy or high in fat, and also to avoid spicy foods. We discussed to take tramadol for pain as needed. A refill was sent to her pharmacy of choice and acetaminophen was discontinued due to risk of potentially masking a fever.    We reviewed the 24-support line [phone] and to call if she has any symptoms related to nausea and vomiting or any new symptoms such as fever, bleeding, diarrhea.  Chelsea Hansen participated in the discussion, expressed understanding, and voiced agreement with the above plan. All questions were answered to her satisfaction. The patient was advised to contact the clinic with any questions or concerns prior to her return visit.  Clinical pharmacy will continue to support Chelsea Hansen  and Dr. Jana Hakim as needed.  Raina Mina, RPH-CPP,  02/03/2021  4:05 PM

## 2021-02-03 NOTE — Telephone Encounter (Signed)
She already received a work Quarry manager from her Oncologist. I have written prescription for walker and seat.

## 2021-02-03 NOTE — Telephone Encounter (Signed)
Can order be written for patient to get walker with a  seat and a cane?

## 2021-02-03 NOTE — Progress Notes (Signed)
[facility] CSW Progress Note  Holiday representative met with patient to work on Location manager for financial assistance. Patient has been unable to work since starting chemo (had very bad nausea and vomiting although had not filled recommended prescriptions) and is overwhelmed with length of treatment.    Pt does not have any benefits (FMLA, short or long term disability) through work (worked at [employer]). Current household income is very limited (lives with son [name] and daughter-in-law who are both on disability).   CSW and pt completed [employer] application and CSW submitted today. CSW reviewed applications for [employer], [place] in [place] as well as what supporting documents are needed. Patient will work on this and plans to bring it in during appts next week. Also discussed process for applying for social security disability, timeline, and length of disability needed (at least 12 months). CSW provided bag of food from food pantry. No other questions or needs at this time.    Christeen Douglas LCSW

## 2021-02-03 NOTE — Progress Notes (Signed)
[place]  [phone] [phone]     ID: KEYSHLA TUNISON DOB: [birth year]  MR#: [id number]  QTM#:[id number]  Patient Care Team: [name], NP as PCP - General (Internal Medicine) [name], RN as Oncology Nurse Navigator [name], RN as Oncology Nurse Navigator [name], MD as Consulting Physician (Radiation Oncology) Moroni Nester, Virgie Dad, MD as Consulting Physician (Oncology) [name], MD as Consulting Physician (General Surgery) [name], MD OTHER MD:  CHIEF COMPLAINT: Bilateral estrogen receptor positive breast cancer  CURRENT TREATMENT:  neoadjuvant chemotherapy   INTERVAL HISTORY: Aylah returns today for follow up and treatment of her bilateral breast cancer.   She started on neoadjuvant chemotherapy, consisting of doxorubicin and cyclophosphamide in dose dense fashion x4, first dose 01/27/2021.  Today is day 8 cycle 1.  She underwent head CT yesterday, 02/02/2021 showing no acute intracranial process.   Neck CT performed the same day showed: no neck mass or adenopathy; cervicothoracic spine metastases.   REVIEW OF SYSTEMS: Yelitza had a pretty horrible experience with her first cycle of AC.  She vomited the night of treatment which is unusual.  She apparently did not fill her antinausea medications and did not take Decadron or Compazine on day 2.  In any case she was too nauseated to take medications.  The morning after chemo she was taken to the ER by her children noted only with nausea and vomiting dehydration but also with bleeding from her mouth.  She has a lesion in the mouth which has not yet been evaluated by ENT but for which we obtained this CT of the neck and head just described, which were negative.  Then on days 3 and 4 she received fluids which helped a bit but she remained nauseated those days and she may have vomited additionally even though she received Zofran IV with the fluid.  In  addition to that she was constipated.  At present, day 8, she feels better.  There has been no further bleeding from the mouth issue, she is still constipated although she has taken MiraLAX a couple of times, she tells me the port is working well.  She has no mouth sores, and she is eager to be treated next week and not wait a total of 3 weeks.  She does have taste perversion.  She did not have significant arthralgias or myalgias related to her pegfilgrastim.  A detailed review of systems was otherwise stable   COVID 19 VACCINATION STATUS: [place] x2, most recently 10/2020, no booster as of June 2022   HISTORY OF CURRENT ILLNESS: From the original intake note:  SAMANTHA RAGEN has a history of excisional biopsy of a fibroadenoma from the lower outer right breast in 2011.  More recently she noted a knot in her right breast.  She brought it to medical attention and proceeded to bilateral diagnostic mammography with tomography and bilateral breast ultrasonography at [facility] on 11/18/2020 showing: breast density category B;   RIGHT: 3.2 cm mass associated with architectural distortion and scattered calcifications in upper-outer right breast at 10 o'clock, corresponding to palpable concern; indeterminate 1.1 cm mass in upper-outer right breast at 9:30; solitary pathologic right axillary lymph node with eccentric cortical thickening up to 7 mm. LEFT: 0.8 cm mass involving upper-outer left breast at 2 o'clock; no pathologic left axillary lymphadenopathy.  Accordingly on 12/03/2020 she proceeded to biopsy of the bilateral breast areas in question. The pathology from this procedure (  [id number]) showed:  Right Breast, 9:30 fibroadenoma Right Breast, 10 o'clock Invasive and in situ mammary carcinoma, e-cadherin negative, grade 2 Prognostic indicators significant for: estrogen receptor, 95% positive and progesterone receptor, 100% positive, both with strong staining intensity. Proliferation marker Ki67 at  40%. HER2 negative by immunohistochemistry (0). 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 Grade or-2  Prognostic indicators significant for: estrogen receptor, 95% positive and progesterone receptor, 100% positive, both with strong staining intensity. Proliferation marker Ki67 at 15%. HER2 negative by immunohistochemistry (1+).  Cancer Staging Malignant neoplasm of upper-outer quadrant of left breast in female, estrogen receptor positive ([place]) Staging form: Breast, AJCC 8th Edition - Clinical stage from 12/17/2020: cT1b, cN0, cM0, ER+, PR+, HER2- - Signed by [name], MD on 12/17/2020 Stage prefix: Initial diagnosis Laterality: Left Staged by: Pathologist and managing physician Stage used in treatment planning: Yes National guidelines used in treatment planning: Yes Type of national guideline used in treatment planning: NCCN  Malignant neoplasm of upper-outer quadrant of right breast in female, estrogen receptor positive ([place]) Staging form: Breast, AJCC 8th Edition - Clinical stage from 12/17/2020: cT2, cN1, cM0, ER+, PR+, HER2- - Signed by [name], MD on 12/17/2020 Stage prefix: Initial diagnosis Laterality: Right Staged by: Pathologist and managing physician Stage used in treatment planning: Yes National guidelines used in treatment planning: Yes Type of national guideline used in treatment planning: NCCN  The patient's subsequent history is as detailed below.   PAST MEDICAL HISTORY: Past Medical History:  Diagnosis Date   Allergy    seasonal allergies   Anxiety    on meds   Asthma    uses inhaler   Breast cancer ([place]) 2012   RIGHT lumpectomy   Cancer ([place]) 2022   RIGHT breast lump-dx 2022   Depression    on meds   DVT (deep venous thrombosis) ([place]) 2010   after hysterectomy    Family history of uterine cancer    GERD (gastroesophageal reflux disease)    with certain foods/OTC PRN meds   Headache(784.0)    Hyperlipidemia    on meds   Hypertension    on meds    PAST SURGICAL HISTORY: Past Surgical History:  Procedure Laterality Date   ABDOMINAL HYSTERECTOMY     BREAST EXCISIONAL BIOPSY Right 09/16/2009   BREAST SURGERY     lumpectomy   PORTACATH PLACEMENT Left 01/26/2021   Procedure: INSERTION PORT-A-CATH;  Surgeon: [name], MD;  Location: [facility];  Service: General;  Laterality: Left;   TUBAL LIGATION    Patient's ovaries are still in place, status post hysterectomy 08/18/2010 with benign pathology   FAMILY HISTORY: Family History  Problem Relation Age of Onset   Cancer Mother        ?, dx <50   Hypertension Father    Cancer Maternal Aunt        uterine vs ovarian, dx <50   Cancer Maternal Aunt        unknown type, dx <50   Uterine cancer Cousin 26   Colon polyps Neg Hx    Colon cancer Neg Hx    Esophageal cancer Neg Hx    Stomach cancer Neg Hx    Her father is age 50 and her mother age 4 as of 11/2020. Her mother has a history of cancer (type unsure).  The patient's mother  had two sisters also with cancer, one with uterine and the other cancer type is unknown to the patient. Delynn has 1 brother and 1 sister.    GYNECOLOGIC HISTORY:  No LMP recorded. Patient has had a hysterectomy. Menarche: 50 years old Age at first live birth: 50 years old [place] P 5 LMP 2012, with hysterectomy Contraceptive never used HRT never used  Hysterectomy? Yes, 07/2010 for fibroids (pathology in Epic) BSO? 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 She is single and lives at home with her three sons. Her two daughters also live here in [place]. Daughter Anae Hams, age 68, works at [employer]. Daughter Jilda Panda Sample, age 69, and son Isiah Nils Pyle, age 18, work at [employer]. Son Sharlee Blew, age 71, and  daughter Alvera Novel, age 9, are currently not employed. Yousra is not a Designer, fashion/clothing.    ADVANCED DIRECTIVES: not in place   HEALTH MAINTENANCE: Social History   Tobacco Use   Smoking status: Never   Smokeless tobacco: Never  Vaping Use   Vaping Use: Never used  Substance Use Topics   Alcohol use: Not Currently    Alcohol/week: 7.0 standard drinks    Types: 7 Standard drinks or equivalent per week    Comment: occassional   Drug use: No     Colonoscopy: 10/2020 (Dr. Fuller Plan), recall 2027  PAP: 05/2010, negative, repeat not indicated (s/p hysterectomy)  Bone density: n/a (age)   No Known Allergies  Current Outpatient Medications  Medication Sig Dispense Refill   dexamethasone (DECADRON) 4 MG tablet Take 2 tablets (8 mg total) by mouth 2 (two) times daily. Take days 2,3 and 4 of each chemotherapy cycle. 40 tablet 0   ondansetron (ZOFRAN) 8 MG tablet Take 1 tablet by mouth every 8 hours as needed for nausea or vomiting. Start day 3 after chemotherapy. 20 tablet 0   acetaminophen (TYLENOL) 500 MG tablet Take 2 tablets (1,000 mg total) by mouth every 6 (six) hours as needed. This is maximum dose of tylenol daily. Do not exceed this dose of tylenol from all sources (check other over the counter and prescribed medications) (Patient taking differently: Take 3,000 mg by mouth every 6 (six) hours as needed for mild pain.) 30 tablet 0   albuterol (VENTOLIN HFA) 108 (90 Base) MCG/ACT inhaler Inhale 2 puffs into the lungs every 6 (six) hours as needed for wheezing or shortness of breath. 18 g 2   amLODipine (NORVASC) 10 MG tablet Take 1 tablet (10 mg total) by mouth daily. (Patient taking differently: Take 10 mg by mouth every evening.) 90 tablet 1   aspirin 500 MG tablet Take 1,000 mg by mouth every 6 (six) hours as needed for pain.     atorvastatin (LIPITOR) 40 MG tablet Take 1 tablet (40 mg total) by mouth daily. (Patient taking differently: Take 40 mg by mouth every evening.) 90 tablet  3   Cholecalciferol (VITAMIN D3) 2000 units capsule Take 2,000 Units by mouth daily.  11   dexamethasone (DECADRON) 4 MG tablet Take 2 tablets (8 mg total) by mouth daily. Take daily for 3 days after chemo. Take with food. 30 tablet 1   diazepam (VALIUM) 5 MG tablet Take 2 tablets (10 mg total) by mouth once for 1 dose 2 tablet 0   escitalopram (LEXAPRO) 20 MG tablet Take 1 tablet (20 mg total) by mouth daily. 90 tablet 1   hydrochlorothiazide (HYDRODIURIL) 25 MG tablet Take 1 tablet (  25 mg total) by mouth daily. 90 tablet 1   lidocaine-prilocaine (EMLA) cream Apply to affected area once as directed 30 g 3   montelukast (SINGULAIR) 10 MG tablet Take 1 tablet (10 mg total) by mouth at bedtime. (Patient taking differently: Take 10 mg by mouth at bedtime as needed (allergies).) 90 tablet 3   Multiple Vitamin (MULTIVITAMIN) tablet Take 2 tablets by mouth daily.     prochlorperazine (COMPAZINE) 10 MG tablet Take one tablet before meals days 2 and 3 after each chemotherapy cycle. 30 tablet 1   promethazine (PHENERGAN) 25 MG suppository Unwrap and insert 1 suppository (25 mg total) rectally every 8 (eight) hours as needed for up to 6 doses for nausea or vomiting. 6 each 0   traMADol (ULTRAM) 50 MG tablet Take 1 tablet by mouth every 6 hours as needed for moderate pain or severe pain. 30 tablet 0   No current facility-administered medications for this visit.   Facility-Administered Medications Ordered in Other Visits  Medication Dose Route Frequency Provider Last Rate Last Admin   ondansetron (ZOFRAN) 4 MG/2ML injection             OBJECTIVE: African-American woman who appears well  Vitals:   02/03/21 1443  BP: 127/76  Pulse: 64  Resp: 18  Temp: 97.7 F (36.5 C)  SpO2: 100%       Body mass index is 30.52 kg/m.   Wt Readings from Last 3 Encounters:  02/03/21 218 lb 12.8 oz (99.2 kg)  01/28/21 218 lb 4.1 oz (99 kg)  01/27/21 218 lb 11.1 oz (99.2 kg)     ECOG FS:1 - Symptomatic but  completely ambulatory  Sclerae unicteric, EOMs intact Wearing a mask No cervical or supraclavicular adenopathy Lungs no rales or rhonchi Heart regular rate and rhythm Abd soft, nontender, positive bowel sounds MSK no focal spinal tenderness, no upper extremity lymphedema Neuro: nonfocal, well oriented, appropriate affect Breasts: The mass in the right breast is palpable in the lateral upper outer quadrant, movable, with slight dimpling of the skin but no erythema.  I do not palpate a mass in the left breast or either axilla.     LAB RESULTS:  CMP     Component Value Date/Time   NA 137 02/03/2021 1419   NA 139 10/06/2020 1432   K 3.6 02/03/2021 1419   CL 101 02/03/2021 1419   CO2 27 02/03/2021 1419   GLUCOSE 111 (H) 02/03/2021 1419   BUN 13 02/03/2021 1419   BUN 11 10/06/2020 1432   CREATININE 0.77 02/03/2021 1419   CREATININE 0.91 12/17/2020 0831   CALCIUM 9.0 02/03/2021 1419   PROT 6.4 (L) 02/03/2021 1419   PROT 6.9 10/06/2020 1432   ALBUMIN 3.2 (L) 02/03/2021 1419   ALBUMIN 4.1 10/06/2020 1432   AST 10 (L) 02/03/2021 1419   AST 15 12/17/2020 0831   ALT 17 02/03/2021 1419   ALT 18 12/17/2020 0831   ALKPHOS 124 02/03/2021 1419   BILITOT 0.3 02/03/2021 1419   BILITOT 0.3 12/17/2020 0831   GFRNONAA >60 02/03/2021 1419   GFRNONAA >60 12/17/2020 0831   GFRAA >60 09/07/2019 1340    Lab Results  Component Value Date   TOTALPROTELP 7.0 12/31/2011   ALBUMINELP 56.4 12/31/2011   A1GS 4.3 12/31/2011   A2GS 10.4 12/31/2011   BETS 6.4 12/31/2011   BETA2SER 4.6 12/31/2011   GAMS 17.9 12/31/2011   MSPIKE NOT DETECTED 12/31/2011   SPEI (NOTE) 12/31/2011    Lab Results  Component  Value Date   WBC 2.3 (L) 02/03/2021   NEUTROABS 0.9 (L) 02/03/2021   HGB 8.1 (L) 02/03/2021   HCT 25.4 (L) 02/03/2021   MCV 72.4 (L) 02/03/2021   PLT 315 02/03/2021    No results found for: LABCA2  No components found for: JGGEZM629  No results for input(s): INR in the last 168  hours.  No results found for: LABCA2  No results found for: UTM546  No results found for: TKP546  No results found for: FKC127  No results found for: CA2729  No components found for: HGQUANT  No results found for: CEA1 / No results found for: CEA1   No results found for: AFPTUMOR  No results found for: [facility]  Lab Results  Component Value Date   KAPLAMBRATIO 10.08 12/31/2011   (kappa/lambda light chains)  No results found for: HGBA, HGBA2QUANT, HGBFQUANT, HGBSQUAN (Hemoglobinopathy evaluation)   Lab Results  Component Value Date   LDH 194 12/31/2011    Lab Results  Component Value Date   IRON 95 12/31/2011   TIBC 325 12/31/2011   IRONPCTSAT 29 12/31/2011   (Iron and TIBC)  Lab Results  Component Value Date   FERRITIN 37 12/31/2011    Urinalysis    Component Value Date/Time   COLORURINE YELLOW 09/07/2019 1341   APPEARANCEUR HAZY (A) 09/07/2019 1341   LABSPEC 1.021 09/07/2019 1341   PHURINE 9.0 (H) 09/07/2019 1341   GLUCOSEU NEGATIVE 09/07/2019 1341   HGBUR NEGATIVE 09/07/2019 1341   BILIRUBINUR NEGATIVE 09/07/2019 1341   KETONESUR NEGATIVE 09/07/2019 1341   PROTEINUR 30 (A) 09/07/2019 1341   UROBILINOGEN 0.2 11/01/2013 1246   NITRITE NEGATIVE 09/07/2019 1341   LEUKOCYTESUR NEGATIVE 09/07/2019 1341    STUDIES: DG Chest 2 View  Result Date: 01/28/2021 CLINICAL DATA:  Chest pain. Hematemesis. Question aspiration. Breast cancer. EXAM: CHEST - 2 VIEW COMPARISON:  01/26/2021 FINDINGS: Heart size is mildly enlarged. Mediastinal shadows are normal. Power port inserted from the left has its tip in the SVC above the right atrium. The lungs are clear. No infiltrate, collapse or effusion. No significant bone finding. IMPRESSION: No active cardiopulmonary disease. Electronically Signed   By: Nelson Chimes M.D.   On: 01/28/2021 09:56   CT HEAD W & WO CONTRAST (5MM)  Addendum Date: 02/03/2021   ADDENDUM REPORT: 02/03/2021 15:00 ADDENDUM: Priors description of  the study as being done without contrast were in error. On postcontrast imaging, no enhancing lesions are seen. No evidence of metastatic disease. Electronically Signed   By: Merilyn Baba M.D.   On: 02/03/2021 15:00   Result Date: 02/03/2021 CLINICAL DATA:  Breast cancer EXAM: CT HEAD WITHOUT AND WITH CONTRAST TECHNIQUE: Contiguous axial images were obtained from the base of the skull through the vertex without and with intravenous contrast CONTRAST:  17m OMNIPAQUE IOHEXOL 350 MG/ML SOLN COMPARISON:  12/31/2011. FINDINGS: Brain: No evidence of acute infarction, hemorrhage, hydrocephalus, extra-axial collection or mass lesion/mass effect. Please note that evaluation for metastatic disease is limited in the absence of intravenous contrast. Vascular: No hyperdense vessel or unexpected calcification. Visible vessels are patent. Skull: Negative for fracture or suspicious lesion. Redemonstrated small lucency in the right frontal calvarium, unchanged compared to 2013. Sinuses/Orbits: No acute finding. Other: None. IMPRESSION: No acute intracranial process. Please note that evaluation for parenchymal metastatic disease is limited in the absence of intravenous contrast. Electronically Signed: By: AMerilyn BabaM.D. On: 02/02/2021 13:34   CT Soft Tissue Neck W Contrast  Result Date: 02/02/2021 CLINICAL DATA:  Neck mass initial workup; metastatic breast cancer, throat mass seen on intubation EXAM: CT NECK WITH CONTRAST TECHNIQUE: Multidetector CT imaging of the neck was performed using the standard protocol following the bolus administration of intravenous contrast. CONTRAST:  36m OMNIPAQUE IOHEXOL 350 MG/ML SOLN COMPARISON:  None FINDINGS: Pharynx and larynx: Unremarkable. There is no mass or swelling identified. Salivary glands: Parotid and submandibular glands are unremarkable. Thyroid: Unremarkable. Lymph nodes: No enlarged or abnormal density nodes. Vascular: Partially imaged left chest wall port catheter. Major  neck vessels are patent. Limited intracranial: Minimally included Visualized orbits: Unremarkable. Mastoids and visualized paranasal sinuses: Retention cyst or polyp of the right maxillary sinus. Skeleton: Lucent lesions of the included spine reflecting metastases. Upper chest: No apical lung mass. Other: Tooth decay with caries and periapical lucencies. IMPRESSION: No neck mass or adenopathy. Cervicothoracic spine metastases. Electronically Signed   By: PMacy MisM.D.   On: 02/02/2021 19:54   DG CHEST PORT 1 VIEW  Result Date: 01/26/2021 CLINICAL DATA:  Status post left Port-A-Cath placement. EXAM: PORTABLE CHEST 1 VIEW COMPARISON:  May 28, 2020. FINDINGS: Stable cardiomegaly. Interval placement of left subclavian Port-A-Cath with distal tip in expected position of cavoatrial junction. No pneumothorax is noted. Lungs are clear. Bony thorax is unremarkable. IMPRESSION: Interval placement of left subclavian Port-A-Cath with distal tip in expected position of cavoatrial junction. Electronically Signed   By: JMarijo ConceptionM.D.   On: 01/26/2021 11:19   DG C-Arm 1-60 Min-No Report  Result Date: 01/26/2021 Fluoroscopy was utilized by the requesting physician.  No radiographic interpretation.   ECHOCARDIOGRAM COMPLETE  Result Date: 01/22/2021    ECHOCARDIOGRAM REPORT   Patient Name:   RLILYMARIE SCROGGINSDate of Exam: 01/22/2021 Medical Rec #:  [id number]    Height:       71.0 in Accession #:    [id number]   Weight:       221.1 lb Date of Birth:  101-15-1972   BSA:          2.200 m Patient Age:    89years      BP:           120/77 mmHg Patient Gender: F             HR:           66 bpm. Exam Location:  Inpatient Procedure: 2D Echo, Color Doppler, Cardiac Doppler and Strain Analysis Indications:    Pre-Chemo Evaluation  History:        Patient has prior history of Echocardiogram examinations, most                 recent 01/01/2012. Risk Factors:Breast Cancer.  Sonographer:    ERaquel SarnaSenior RDCS Referring Phys:  [id number] ValleyComments: Technically difficult due to poor echo windows. IMPRESSIONS  1. Left ventricular ejection fraction, by estimation, is 60 to 65%. The left ventricle has normal function. The left ventricle has no regional wall motion abnormalities. Left ventricular diastolic parameters were normal.  2. Right ventricular systolic function is normal. The right ventricular size is normal.  3. Left atrial size was mildly dilated.  4. The mitral valve is normal in structure. Trivial mitral valve regurgitation. No evidence of mitral stenosis.  5. The aortic valve was not well visualized. There is mild calcification of the aortic valve. Aortic valve regurgitation is not visualized. No aortic stenosis is present.  6. The inferior vena cava is normal in size  with greater than 50% respiratory variability, suggesting right atrial pressure of 3 mmHg. FINDINGS  Left Ventricle: Left ventricular ejection fraction, by estimation, is 60 to 65%. The left ventricle has normal function. The left ventricle has no regional wall motion abnormalities. The left ventricular internal cavity size was normal in size. There is  no left ventricular hypertrophy. Left ventricular diastolic parameters were normal. Right Ventricle: The right ventricular size is normal. No increase in right ventricular wall thickness. Right ventricular systolic function is normal. Left Atrium: Left atrial size was mildly dilated. Right Atrium: Right atrial size was normal in size. Pericardium: There is no evidence of pericardial effusion. Mitral Valve: The mitral valve is normal in structure. Trivial mitral valve regurgitation. No evidence of mitral valve stenosis. Tricuspid Valve: The tricuspid valve is normal in structure. Tricuspid valve regurgitation is trivial. No evidence of tricuspid stenosis. Aortic Valve: The aortic valve was not well visualized. There is mild calcification of the aortic valve. Aortic valve regurgitation is not  visualized. No aortic stenosis is present. Pulmonic Valve: The pulmonic valve was normal in structure. Pulmonic valve regurgitation is not visualized. No evidence of pulmonic stenosis. Aorta: The aortic root is normal in size and structure. Venous: The inferior vena cava is normal in size with greater than 50% respiratory variability, suggesting right atrial pressure of 3 mmHg. IAS/Shunts: No atrial level shunt detected by color flow Doppler.  LEFT VENTRICLE PLAX 2D LVIDd:         4.80 cm  Diastology LVIDs:         3.30 cm  LV e' medial:    6.74 cm/s LV PW:         1.20 cm  LV E/e' medial:  11.3 LV IVS:        1.10 cm  LV e' lateral:   8.16 cm/s LVOT diam:     2.00 cm  LV E/e' lateral: 9.4 LV SV:         77 LV SV Index:   35       2D Longitudinal Strain LVOT Area:     3.14 cm 2D Strain GLS Avg:     -20.8 %  RIGHT VENTRICLE RV S prime:     12.60 cm/s TAPSE (M-mode): 2.5 cm LEFT ATRIUM             Index       RIGHT ATRIUM           Index LA diam:        3.60 cm 1.64 cm/m  RA Area:     14.20 cm LA Vol (A2C):   50.3 ml 22.86 ml/m RA Volume:   32.80 ml  14.91 ml/m LA Vol (A4C):   58.7 ml 26.68 ml/m LA Biplane Vol: 55.8 ml 25.36 ml/m  AORTIC VALVE LVOT Vmax:   120.00 cm/s LVOT Vmean:  85.000 cm/s LVOT VTI:    0.245 m  AORTA Ao Root diam: 3.40 cm Ao Asc diam:  3.70 cm MITRAL VALVE MV Area (PHT): 3.16 cm    SHUNTS MV Decel Time: 240 msec    Systemic VTI:  0.24 m MV E velocity: 76.30 cm/s  Systemic Diam: 2.00 cm MV A velocity: 75.40 cm/s MV E/A ratio:  1.01 Glori Bickers MD Electronically signed by Glori Bickers MD Signature Date/Time: 01/22/2021/2:57:06 PM    Final      ELIGIBLE FOR AVAILABLE RESEARCH PROTOCOL: no  ASSESSMENT: 50 y.o. [place] woman status post bilateral breast biopsies 12/03/2020, showing  (1) on  the right, a clinical T2 N1, stage IIa invasive lobular carcinoma, grade 2, estrogen and progesterone receptor strongly positive, HER2 not amplified, with an MIB-1 of 40%   (a) the biopsied  right axillary lymph node was positive with extracapsular extension   (b) a second right breast mass also biopsied was a fibroadenoma, concordant   (c) biopsy of an area of non-mass-like enhancement in the upper right breast pending   (2) on the left, a clinical T1b N0, stage IA invasive ductal carcinoma, grade 1 or 2, estrogen and progesterone receptor positive, HER2 not amplified, with an MIB 1 of 15%   (a) biopsy of a 0.4 cm enhancement in the medial left breast pending  (3) genetics testing through the [facility] BRCAplus panel (report date 12/24/2020) or the CancerNext-Expanded + RNAinsight panel (report date 12/31/2020) found no deleterious mutations in ATM, BRCA1, BRCA2, CDH1, CHEK2, PALB2, PTEN, and TP53. The CancerNext-Expanded + RNAinsight gene panel offered by [employer] and includes sequencing and rearrangement analysis for the following 77 genes: AIP, ALK, APC, ATM, AXIN2, BAP1, BARD1, BLM, BMPR1A, BRCA1, BRCA2, BRIP1, CDC73, CDH1, CDK4, CDKN1B, CDKN2A, CHEK2, CTNNA1, DICER1, FANCC, FH, FLCN, GALNT12, KIF1B, LZTR1, MAX, MEN1, MET, MLH1, MSH2, MSH3, MSH6, MUTYH, NBN, NF1, NF2, NTHL1, PALB2, PHOX2B, PMS2, POT1, PRKAR1A, PTCH1, PTEN, RAD51C, RAD51D, RB1, RECQL, RET, SDHA, SDHAF2, SDHB, SDHC, SDHD, SMAD4, SMARCA4, SMARCB1, SMARCE1, STK11, SUFU, TMEM127, TP53, TSC1, TSC2, VHL and XRCC2 (sequencing and deletion/duplication); EGFR, EGLN1, HOXB13, KIT, MITF, PDGFRA, POLD1 and POLE (sequencing only); EPCAM and GREM1 (deletion/duplication only). RNA data is routinely analyzed for use in variant interpretation for all genes.  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  (5) definitive surgery to follow chemotherapy  (6) chemotherapy consisting of doxorubicin and cyclophosphamide in dose dense fashion  x4 started 01/27/2021 to be followed by weekly paclitaxel x12.  (7) adjuvant radiation on the right, possibly also on the left  (8) antiestrogens to follow at the completion of local treatment  (9) oral mass noted at time of intubation for port placement:  (A) CT head and neck 02/02/2021 negative  (B) ENT evaluation pending   PLAN: [name] has recovered nicely from her first cycle of chemotherapy.  Her ANC today is 0.9 and we are not going to add prophylactic antibiotics at this point.  We have added olanzapine to her chemo premeds.  I have refilled her dexamethasone and Compazine and our pharmacist is also met with her and added olanzapine to her home treatments.  He also gave her the emergency line to call if she has problems  She will return in 1 week for cycle #2.  We are going to do fluids on days 2 3 and 4 just to make 100% sure she tolerates it well.  She also met with our social worker today and was instructed on how to proceed to apply for temporary disability  Total encounter time 40 minutes.Sarajane Jews C. Paislei Dorval, MD 02/03/2021 3:52 PM Medical Oncology and Hematology [facility] [place], [place] [address] Tel. [phone]    Fax. [phone]   This document serves as a record of services personally performed by Lurline Del, MD. It was created on his behalf by Wilburn Mylar, a trained medical scribe. The creation of this record is based on the scribe's personal  observations and the provider's statements to them.   I, Lurline Del MD, have reviewed the above documentation for accuracy and completeness, and I agree with the above.   *Total Encounter Time as defined by the Centers for Medicare and Medicaid Services includes, in addition to the face-to-face time of a patient visit (documented in the note above) non-face-to-face time: obtaining and reviewing outside history, ordering and reviewing medications, tests or procedures, care  coordination (communications with other health care professionals or caregivers) and documentation in the medical record.

## 2021-02-04 ENCOUNTER — Other Ambulatory Visit (HOSPITAL_COMMUNITY): Payer: Self-pay

## 2021-02-04 ENCOUNTER — Encounter: Payer: Self-pay | Admitting: *Deleted

## 2021-02-04 NOTE — Telephone Encounter (Signed)
It is on her list as Misc.Please have [facility] show you how to print this off. Thanks

## 2021-02-04 NOTE — Telephone Encounter (Signed)
Order for walker with seat is not seen in patient chart.

## 2021-02-05 ENCOUNTER — Other Ambulatory Visit: Payer: Self-pay | Admitting: Family Medicine

## 2021-02-05 ENCOUNTER — Other Ambulatory Visit (HOSPITAL_COMMUNITY): Payer: Self-pay

## 2021-02-05 ENCOUNTER — Telehealth: Payer: Self-pay | Admitting: *Deleted

## 2021-02-05 MED ORDER — MISC. DEVICES MISC: 0 refills | Status: DC

## 2021-02-05 NOTE — Telephone Encounter (Signed)
This RN called to Ear Nose and Throat with [facility] and obtained an appointment for tomorrow at 345 with [facility].  This RN called and informed the patient with verbalized understanding .  Of note she states today she " is having pain all over my body- feels like I can barely move "  She also states low grade temp of 99.5.  No other symptoms.  This RN discussed above may be related to the "shot " we gave her to cause her body to make her immune cells more quickly- and discussed home remedies and medications for benefit.  Makya appreciated discussion.  No further needs at this time.

## 2021-02-06 ENCOUNTER — Other Ambulatory Visit (HOSPITAL_COMMUNITY): Payer: Self-pay

## 2021-02-06 MED ORDER — FLUTICASONE PROPIONATE 50 MCG/ACT NA SUSP
2.0000 | Freq: Every day | NASAL | 11 refills | Status: DC
  Filled 2021-02-06 – 2021-02-26 (×2): qty 16, 30d supply, fill #0
  Filled 2021-04-14: qty 16, 30d supply, fill #1
  Filled 2021-07-08: qty 16, 30d supply, fill #2

## 2021-02-06 NOTE — Telephone Encounter (Signed)
Patient aware that Rx is ready for pick up 

## 2021-02-07 ENCOUNTER — Other Ambulatory Visit (HOSPITAL_COMMUNITY): Payer: Self-pay

## 2021-02-09 ENCOUNTER — Other Ambulatory Visit: Payer: Self-pay

## 2021-02-09 ENCOUNTER — Other Ambulatory Visit: Payer: Self-pay | Admitting: Oncology

## 2021-02-09 ENCOUNTER — Other Ambulatory Visit (HOSPITAL_COMMUNITY): Payer: Self-pay

## 2021-02-09 ENCOUNTER — Telehealth (INDEPENDENT_AMBULATORY_CARE_PROVIDER_SITE_OTHER): Payer: Self-pay | Admitting: Primary Care

## 2021-02-09 ENCOUNTER — Ambulatory Visit: Payer: Medicaid Other

## 2021-02-09 ENCOUNTER — Ambulatory Visit: Admission: RE | Admit: 2021-02-09 | Payer: Medicaid Other | Source: Ambulatory Visit

## 2021-02-09 ENCOUNTER — Encounter: Payer: Self-pay | Admitting: Oncology

## 2021-02-09 MED ORDER — DIAZEPAM 5 MG PO TABS
ORAL_TABLET | ORAL | 0 refills | Status: DC
  Filled 2021-02-09: qty 3, 1d supply, fill #0

## 2021-02-09 NOTE — Telephone Encounter (Signed)
Tramadol not working and Tylenol cannot take and pt still having pain.   Pain scheduled upcoming appt (Sept 8th)  Pt requesting Claritin as well   [facility] on [address].

## 2021-02-09 NOTE — Progress Notes (Signed)
Dr. Kandis Cocking note from her February 06 2021 visit notes mild mucositis, thrush, and reflux.  When we see [facility] again we will start her on a PPI and Diflucan for thrush

## 2021-02-09 NOTE — Telephone Encounter (Signed)
Sent to PCP to address with patient.

## 2021-02-10 ENCOUNTER — Encounter: Payer: Self-pay | Admitting: Oncology

## 2021-02-10 ENCOUNTER — Other Ambulatory Visit (HOSPITAL_COMMUNITY): Payer: Self-pay

## 2021-02-10 ENCOUNTER — Inpatient Hospital Stay: Payer: Medicaid Other

## 2021-02-10 ENCOUNTER — Other Ambulatory Visit: Payer: Self-pay | Admitting: Oncology

## 2021-02-10 ENCOUNTER — Encounter: Payer: Self-pay | Admitting: *Deleted

## 2021-02-10 ENCOUNTER — Inpatient Hospital Stay (HOSPITAL_BASED_OUTPATIENT_CLINIC_OR_DEPARTMENT_OTHER): Payer: Medicaid Other | Admitting: Oncology

## 2021-02-10 VITALS — BP 135/81 | HR 69 | Temp 97.5°F | Resp 18 | Ht 71.0 in | Wt 216.1 lb

## 2021-02-10 DIAGNOSIS — C50412 Malignant neoplasm of upper-outer quadrant of left female breast: Secondary | ICD-10-CM

## 2021-02-10 DIAGNOSIS — Z170 Estrogen receptor positive status [ER+]: Secondary | ICD-10-CM | POA: Diagnosis not present

## 2021-02-10 DIAGNOSIS — Z5111 Encounter for antineoplastic chemotherapy: Secondary | ICD-10-CM | POA: Diagnosis not present

## 2021-02-10 DIAGNOSIS — C50411 Malignant neoplasm of upper-outer quadrant of right female breast: Secondary | ICD-10-CM

## 2021-02-10 DIAGNOSIS — Z95828 Presence of other vascular implants and grafts: Secondary | ICD-10-CM

## 2021-02-10 LAB — CBC WITH DIFFERENTIAL/PLATELET
Abs Immature Granulocytes: 1.74 10*3/uL — ABNORMAL HIGH (ref 0.00–0.07)
Basophils Absolute: 0.1 10*3/uL (ref 0.0–0.1)
Basophils Relative: 0 %
Eosinophils Absolute: 0 10*3/uL (ref 0.0–0.5)
Eosinophils Relative: 0 %
HCT: 26 % — ABNORMAL LOW (ref 36.0–46.0)
Hemoglobin: 8.3 g/dL — ABNORMAL LOW (ref 12.0–15.0)
Immature Granulocytes: 13 %
Lymphocytes Relative: 19 %
Lymphs Abs: 2.5 10*3/uL (ref 0.7–4.0)
MCH: 23.1 pg — ABNORMAL LOW (ref 26.0–34.0)
MCHC: 31.9 g/dL (ref 30.0–36.0)
MCV: 72.4 fL — ABNORMAL LOW (ref 80.0–100.0)
Monocytes Absolute: 1 10*3/uL (ref 0.1–1.0)
Monocytes Relative: 7 %
Neutro Abs: 8 10*3/uL — ABNORMAL HIGH (ref 1.7–7.7)
Neutrophils Relative %: 61 %
Platelets: 241 10*3/uL (ref 150–400)
RBC: 3.59 MIL/uL — ABNORMAL LOW (ref 3.87–5.11)
RDW: 17.2 % — ABNORMAL HIGH (ref 11.5–15.5)
WBC: 13.3 10*3/uL — ABNORMAL HIGH (ref 4.0–10.5)
nRBC: 0.4 % — ABNORMAL HIGH (ref 0.0–0.2)

## 2021-02-10 LAB — COMPREHENSIVE METABOLIC PANEL
ALT: 13 U/L (ref 0–44)
AST: 15 U/L (ref 15–41)
Albumin: 3.5 g/dL (ref 3.5–5.0)
Alkaline Phosphatase: 132 U/L — ABNORMAL HIGH (ref 38–126)
Anion gap: 11 (ref 5–15)
BUN: 10 mg/dL (ref 6–20)
CO2: 26 mmol/L (ref 22–32)
Calcium: 9.4 mg/dL (ref 8.9–10.3)
Chloride: 106 mmol/L (ref 98–111)
Creatinine, Ser: 0.87 mg/dL (ref 0.44–1.00)
GFR, Estimated: >60 mL/min (ref >60)
Glucose, Bld: 98 mg/dL (ref 70–99)
Potassium: 3.7 mmol/L (ref 3.5–5.1)
Sodium: 143 mmol/L (ref 135–145)
Total Bilirubin: <0.2 mg/dL — ABNORMAL LOW (ref 0.3–1.2)
Total Protein: 7.1 g/dL (ref 6.5–8.1)

## 2021-02-10 MED ORDER — DOXORUBICIN HCL CHEMO IV INJECTION 2 MG/ML
60.0000 mg/m2 | Freq: Once | INTRAVENOUS | Status: AC
  Administered 2021-02-10: 134 mg via INTRAVENOUS
  Filled 2021-02-10: qty 67

## 2021-02-10 MED ORDER — OMEPRAZOLE 40 MG PO CPDR
40.0000 mg | DELAYED_RELEASE_CAPSULE | Freq: Every evening | ORAL | 0 refills | Status: DC
  Filled 2021-02-10 – 2021-02-26 (×2): qty 60, 60d supply, fill #0

## 2021-02-10 MED ORDER — PALONOSETRON HCL INJECTION 0.25 MG/5ML
0.2500 mg | Freq: Once | INTRAVENOUS | Status: AC
  Administered 2021-02-10: 0.25 mg via INTRAVENOUS
  Filled 2021-02-10: qty 5

## 2021-02-10 MED ORDER — ACETAMINOPHEN 500 MG PO TABS
500.0000 mg | ORAL_TABLET | Freq: Once | ORAL | Status: AC
  Administered 2021-02-10: 500 mg via ORAL

## 2021-02-10 MED ORDER — SODIUM CHLORIDE 0.9 % IV SOLN: Freq: Once | INTRAVENOUS | Status: AC

## 2021-02-10 MED ORDER — FLUCONAZOLE 100 MG PO TABS
100.0000 mg | ORAL_TABLET | Freq: Every day | ORAL | 0 refills | Status: DC
  Filled 2021-02-10 – 2021-02-26 (×2): qty 20, 20d supply, fill #0

## 2021-02-10 MED ORDER — SODIUM CHLORIDE 0.9 % IV SOLN
10.0000 mg | Freq: Once | INTRAVENOUS | Status: AC
  Administered 2021-02-10: 10 mg via INTRAVENOUS
  Filled 2021-02-10: qty 10

## 2021-02-10 MED ORDER — SODIUM CHLORIDE 0.9% FLUSH
10.0000 mL | INTRAVENOUS | Status: DC | PRN
  Administered 2021-02-10: 10 mL

## 2021-02-10 MED ORDER — SODIUM CHLORIDE 0.9 % IV SOLN
600.0000 mg/m2 | Freq: Once | INTRAVENOUS | Status: AC
  Administered 2021-02-10: 1340 mg via INTRAVENOUS
  Filled 2021-02-10: qty 67

## 2021-02-10 MED ORDER — HEPARIN SOD (PORK) LOCK FLUSH 100 UNIT/ML IV SOLN
500.0000 [IU] | Freq: Once | INTRAVENOUS | Status: AC | PRN
  Administered 2021-02-10: 500 [IU]

## 2021-02-10 MED ORDER — OLANZAPINE 5 MG PO TABS
5.0000 mg | ORAL_TABLET | Freq: Once | ORAL | Status: AC
  Administered 2021-02-10: 5 mg via ORAL
  Filled 2021-02-10: qty 1

## 2021-02-10 MED ORDER — NAPROXEN SODIUM 220 MG PO TABS
220.0000 mg | ORAL_TABLET | Freq: Three times a day (TID) | ORAL | 0 refills | Status: DC | PRN
  Filled 2021-02-10: qty 90, 30d supply, fill #0

## 2021-02-10 MED ORDER — SODIUM CHLORIDE 0.9 % IV SOLN
150.0000 mg | Freq: Once | INTRAVENOUS | Status: AC
  Administered 2021-02-10: 150 mg via INTRAVENOUS
  Filled 2021-02-10: qty 150

## 2021-02-10 MED ORDER — ACETAMINOPHEN 500 MG PO TABS
500.0000 mg | ORAL_TABLET | Freq: Three times a day (TID) | ORAL | 0 refills | Status: DC | PRN
  Filled 2021-02-10: qty 90, 30d supply, fill #0

## 2021-02-10 MED ORDER — ACETAMINOPHEN 500 MG PO TABS
ORAL_TABLET | ORAL | Status: AC
  Filled 2021-02-10: qty 1

## 2021-02-10 MED ORDER — SODIUM CHLORIDE 0.9% FLUSH
10.0000 mL | Freq: Once | INTRAVENOUS | Status: AC
  Administered 2021-02-10: 10 mL

## 2021-02-10 NOTE — Patient Instructions (Signed)
[name]  Discharge Instructions: Thank you for choosing [facility] to provide your oncology and hematology care.   If you have a lab appointment with the [name], please go directly to the [name] and check in at the registration area.   Wear comfortable clothing and clothing appropriate for easy access to any Portacath or PICC line.   We strive to give you quality time with your provider. You may need to reschedule your appointment if you arrive late (15 or more minutes).  Arriving late affects you and other patients whose appointments are after yours.  Also, if you miss three or more appointments without notifying the office, you may be dismissed from the clinic at the provider's discretion.      For prescription refill requests, have your pharmacy contact our office and allow 72 hours for refills to be completed.    Today you received the following chemotherapy and/or immunotherapy agents: Adriamycin, Cytoxan     To help prevent nausea and vomiting after your treatment, we encourage you to take your nausea medication as directed.  BELOW ARE SYMPTOMS THAT SHOULD BE REPORTED IMMEDIATELY: . *FEVER GREATER THAN 100.4 F (38 C) OR HIGHER . *CHILLS OR SWEATING . *NAUSEA AND VOMITING THAT IS NOT CONTROLLED WITH YOUR NAUSEA MEDICATION . *UNUSUAL SHORTNESS OF BREATH . *UNUSUAL BRUISING OR BLEEDING . *URINARY PROBLEMS (pain or burning when urinating, or frequent urination) . *BOWEL PROBLEMS (unusual diarrhea, constipation, pain near the anus) . TENDERNESS IN MOUTH AND THROAT WITH OR WITHOUT PRESENCE OF ULCERS (sore throat, sores in mouth, or a toothache) . UNUSUAL RASH, SWELLING OR PAIN  . UNUSUAL VAGINAL DISCHARGE OR ITCHING   Items with * indicate a potential emergency and should be followed up as soon as possible or go to the Emergency Department if any problems should occur.  Please show the CHEMOTHERAPY ALERT CARD or  IMMUNOTHERAPY ALERT CARD at check-in to the Emergency Department and triage nurse.  Should you have questions after your visit or need to cancel or reschedule your appointment, please contact [name]  Dept: [phone]  and follow the prompts.  Office hours are 8:00 a.m. to 4:30 p.m. Monday - Friday. Please note that voicemails left after 4:00 p.m. may not be returned until the following business day.  We are closed weekends and major holidays. You have access to a nurse at all times for urgent questions. Please call the main number to the clinic Dept: [phone] and follow the prompts.   For any non-urgent questions, you may also contact your provider using MyChart. We now offer e-Visits for anyone 18 and older to request care online for non-urgent symptoms. For details visit [email or web address].   Also download the MyChart app! Go to the app store, search "MyChart", open the app, select [facility], and log in with your MyChart username and password.  Due to Covid, a mask is required upon entering the hospital/clinic. If you do not have a mask, one will be given to you upon arrival. For doctor visits, patients may have 1 support person aged 18 or older with them. For treatment visits, patients cannot have anyone with them due to current Covid guidelines and our immunocompromised population.   

## 2021-02-10 NOTE — Progress Notes (Signed)
[facility]  [phone] [phone]     ID: Chelsea Hansen DOB: 07/11/1961  MR#: [id number]  OVZ#:[id number]  Patient Care Team: [name], NP as PCP - General (Internal Medicine) [name], RN as Oncology Nurse Navigator [name], RN as Oncology Nurse Navigator [name], MD as Consulting Physician (Radiation Oncology) Vonzell Lindblad, Virgie Dad, MD as Consulting Physician (Oncology) [name], MD as Consulting Physician (General Surgery) [name], MD OTHER MD:  CHIEF COMPLAINT: Bilateral estrogen receptor positive breast cancer  CURRENT TREATMENT:  neoadjuvant chemotherapy   INTERVAL HISTORY: Amirrah returns today for follow up and treatment of her bilateral breast cancer.   She has started on neoadjuvant chemotherapy, consisting of doxorubicin and cyclophosphamide in dose dense fashion x4, first dose 01/27/2021.  Today is day 1 of cycle 2  She underwent head CT 02/02/2021 showing no acute intracranial process.   Neck CT performed the same day showed: no neck mass or adenopathy; cervicothoracic spine metastases.  She was scheduled for MR biopsy of the right and left breast lesions not yet biopsied but tells me she had a "panic attack" and although she took Valium she was not able to undergo the procedure   REVIEW OF SYSTEMS: Christon tells me she has constant pain in both breasts.  She says that the tramadol did not work and also upset her stomach.  She says that she is not supposed to take any drugs like Aleve because of clotting issues (I believe her surgeon told her to avoid those shortly before invasive procedures).  She also says the Valium did not work.  She is on M-Escitalopram but only takes it perhaps twice a week.  She tells me she has a very good understanding of how to take her Decadron and Compazine which she inadvertently omitted with her first cycle and that led to significant  complications.   COVID 19 VACCINATION STATUS: [place] x2, most recently 10/2020, no booster as of June 2029   HISTORY OF CURRENT ILLNESS: From the original intake note:  Chelsea Hansen has a history of excisional biopsy of a fibroadenoma from the lower outer right breast in 2011.  More recently she noted a knot in her right breast.  She brought it to medical attention and proceeded to bilateral diagnostic mammography with tomography and bilateral breast ultrasonography at [facility] on 11/18/2020 showing: breast density category B;   RIGHT: 3.2 cm mass associated with architectural distortion and scattered calcifications in upper-outer right breast at 10 o'clock, corresponding to palpable concern; indeterminate 1.1 cm mass in upper-outer right breast at 9:30; solitary pathologic right axillary lymph node with eccentric cortical thickening up to 7 mm. LEFT: 0.8 cm mass involving upper-outer left breast at 2 o'clock; no pathologic left axillary lymphadenopathy.  Accordingly on 12/03/2020 she proceeded to biopsy of the bilateral breast areas in question. The pathology from this procedure ([id number]) showed:  Right Breast, 9:30 fibroadenoma Right Breast, 10 o'clock Invasive and in situ mammary carcinoma, e-cadherin negative, grade 2 Prognostic indicators significant for: estrogen receptor, 95% positive and progesterone receptor, 100% positive, both with strong staining intensity. Proliferation marker Ki67 at 40%. HER2 negative by immunohistochemistry (0). 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 Grade or-2  Prognostic indicators significant for: estrogen receptor, 95% positive and progesterone receptor, 100% positive, both with  strong staining intensity. Proliferation marker Ki67 at 15%. HER2 negative by immunohistochemistry  (1+).  Cancer Staging Malignant neoplasm of upper-outer quadrant of left breast in female, estrogen receptor positive ([place]) Staging form: Breast, AJCC 8th Edition - Clinical stage from 12/17/2020: cT1b, cN0, cM0, ER+, PR+, HER2- - Signed by [name], MD on 12/17/2020 Stage prefix: Initial diagnosis Laterality: Left Staged by: Pathologist and managing physician Stage used in treatment planning: Yes National guidelines used in treatment planning: Yes Type of national guideline used in treatment planning: NCCN  Malignant neoplasm of upper-outer quadrant of right breast in female, estrogen receptor positive ([place]) Staging form: Breast, AJCC 8th Edition - Clinical stage from 12/17/2020: cT2, cN1, cM0, ER+, PR+, HER2- - Signed by [name], MD on 12/17/2020 Stage prefix: Initial diagnosis Laterality: Right Staged by: Pathologist and managing physician Stage used in treatment planning: Yes National guidelines used in treatment planning: Yes Type of national guideline used in treatment planning: NCCN  The patient's subsequent history is as detailed below.   PAST MEDICAL HISTORY: Past Medical History:  Diagnosis Date   Allergy    seasonal allergies   Anxiety    on meds   Asthma    uses inhaler   Breast cancer ([place]) 2012   RIGHT lumpectomy   Cancer ([place]) 2022   RIGHT breast lump-dx 2022   Depression    on meds   DVT (deep venous thrombosis) ([place]) 2010   after hysterectomy   Family history of uterine cancer    GERD (gastroesophageal reflux disease)    with certain foods/OTC PRN meds   Headache(784.0)    Hyperlipidemia    on meds   Hypertension    on meds    PAST SURGICAL HISTORY: Past Surgical History:  Procedure Laterality Date   ABDOMINAL HYSTERECTOMY     BREAST EXCISIONAL BIOPSY Right 09/16/2009   BREAST SURGERY     lumpectomy   PORTACATH PLACEMENT Left 01/26/2021   Procedure: INSERTION PORT-A-CATH;  Surgeon: [name], MD;  Location: [facility];   Service: General;  Laterality: Left;   TUBAL LIGATION    Patient's ovaries are still in place, status post hysterectomy 08/18/2010 with benign pathology   FAMILY HISTORY: Family History  Problem Relation Age of Onset   Cancer Mother        ?, dx <50   Hypertension Father    Cancer Maternal Aunt        uterine vs ovarian, dx <50   Cancer Maternal Aunt        unknown type, dx <50   Uterine cancer Cousin 32   Colon polyps Neg Hx    Colon cancer Neg Hx    Esophageal cancer Neg Hx    Stomach cancer Neg Hx    Her father is age 66 and her mother age 27 as of 11/2020. Her mother has a history of cancer (type unsure).  The patient's mother had two sisters also with cancer, one with uterine and the other cancer type is unknown to the patient. Chayla has 1 brother and 1 sister.    GYNECOLOGIC HISTORY:  No LMP recorded. Patient has had a hysterectomy. Menarche: 50 years old Age at first live birth: 50 years old [place] P 5 LMP 2012, with hysterectomy Contraceptive never used HRT never used  Hysterectomy? Yes, 07/2010 for fibroids (pathology in Epic) BSO? 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 She is single and lives at home  with her three sons. Her two daughters also live here in [place]. Daughter Kasandra Fehr, age 52, works at [employer]. Daughter Jilda Panda Sample, age 18, and son Isiah Nils Pyle, age 27, work at [employer]. Son Sharlee Blew, age 5, and daughter Alvera Novel, age 68, are currently not employed. Torryn is not a Designer, fashion/clothing.    ADVANCED DIRECTIVES: not in place   HEALTH MAINTENANCE: Social History   Tobacco Use   Smoking status: Never   Smokeless tobacco: Never  Vaping Use   Vaping Use: Never used  Substance Use Topics   Alcohol use: Not Currently    Alcohol/week: 7.0 standard drinks    Types: 7 Standard drinks or equivalent per week    Comment: occassional   Drug use: No     Colonoscopy: 10/2020 (Dr. Fuller Plan),  recall 2027  PAP: 05/2010, negative, repeat not indicated (s/p hysterectomy)  Bone density: n/a (age)   No Known Allergies  Current Outpatient Medications  Medication Sig Dispense Refill   albuterol (VENTOLIN HFA) 108 (90 Base) MCG/ACT inhaler Inhale 2 puffs into the lungs every 6 (six) hours as needed for wheezing or shortness of breath. 18 g 2   amLODipine (NORVASC) 10 MG tablet Take 1 tablet (10 mg total) by mouth daily. (Patient taking differently: Take 10 mg by mouth every evening.) 90 tablet 1   aspirin 500 MG tablet Take 1,000 mg by mouth every 6 (six) hours as needed for pain.     atorvastatin (LIPITOR) 40 MG tablet Take 1 tablet (40 mg total) by mouth daily. (Patient taking differently: Take 40 mg by mouth every evening.) 90 tablet 3   Cholecalciferol (VITAMIN D3) 2000 units capsule Take 2,000 Units by mouth daily.  11   dexamethasone (DECADRON) 4 MG tablet Take 2 tablets (8 mg total) by mouth daily. Take daily for 3 days after chemo. Take with food. 30 tablet 1   dexamethasone (DECADRON) 4 MG tablet Take 2 tablets (8 mg total) by mouth 2 (two) times daily. Take days 2,3 and 4 of each chemotherapy cycle. 40 tablet 0   diazepam (VALIUM) 5 MG tablet Take 1 tablet (5 mg total) by mouth once for 1 dose 3 tablet 0   escitalopram (LEXAPRO) 20 MG tablet Take 1 tablet (20 mg total) by mouth daily. 90 tablet 1   fluticasone (FLONASE) 50 MCG/ACT nasal spray Use 2 sprays in each nostril daily. 16 g 11   hydrochlorothiazide (HYDRODIURIL) 25 MG tablet Take 1 tablet (25 mg total) by mouth daily. 90 tablet 1   lidocaine-prilocaine (EMLA) cream Apply to affected area once as directed 30 g 3   Misc. Devices [place] with seat. Diagnosis - gait instability 1 each 0   Misc. Devices [employer]. Diagnosis- gait instability 1 each 0   montelukast (SINGULAIR) 10 MG tablet Take 1 tablet (10 mg total) by mouth at bedtime. (Patient taking differently: Take 10 mg by mouth at bedtime as needed (allergies).) 90  tablet 3   Multiple Vitamin (MULTIVITAMIN) tablet Take 2 tablets by mouth daily.     OLANZapine (ZYPREXA) 5 MG tablet Take 1 tablet (5 mg total) by mouth at bedtime. Take on days 2, 3, and 4 after chemotherapy. May take 2 tablets (10 mg total) by mouth at bedtime if 5 mg does not stop nausea/vomiting. 18 tablet 0   ondansetron (ZOFRAN) 8 MG tablet Take 1 tablet by mouth every 8 hours as needed for nausea or vomiting. Start day 3 after chemotherapy. 20 tablet 0  prochlorperazine (COMPAZINE) 10 MG tablet Take one tablet before meals days 2 and 3 after each chemotherapy cycle. 30 tablet 1   promethazine (PHENERGAN) 25 MG suppository Unwrap and insert 1 suppository (25 mg total) rectally every 8 (eight) hours as needed for up to 6 doses for nausea or vomiting. 6 each 0   traMADol (ULTRAM) 50 MG tablet Take 1 tablet by mouth every 6 hours as needed for moderate pain or severe pain. 30 tablet 0   No current facility-administered medications for this visit.   Facility-Administered Medications Ordered in Other Visits  Medication Dose Route Frequency Provider Last Rate Last Admin   ondansetron (ZOFRAN) 4 MG/2ML injection             OBJECTIVE: African-American woman in no acute distress  Vitals:   02/10/21 1335  BP: 135/81  Pulse: 69  Resp: 18  Temp: (!) 97.5 F (36.4 C)  SpO2: 98%       Body mass index is 30.14 kg/m.   Wt Readings from Last 3 Encounters:  02/10/21 216 lb 1.6 oz (98 kg)  02/03/21 218 lb 12.8 oz (99.2 kg)  01/28/21 218 lb 4.1 oz (99 kg)     ECOG FS:1 - Symptomatic but completely ambulatory  Sclerae unicteric, EOMs intact Wearing a mask Lungs no rales or rhonchi Heart regular rate and rhythm Abd soft, nontender, positive bowel sounds MSK no focal spinal tenderness, no upper extremity lymphedema Neuro: nonfocal, well oriented, appropriate affect Breasts: The mass in the right breast continues to be palpable in the lateral upper outer quadrant.  It is movable and firm.   There is minimal skin dimpling without erythema.  I do not palpate a mass in the left breast or either axilla.     LAB RESULTS:  CMP     Component Value Date/Time   NA 137 02/03/2021 1419   NA 139 10/06/2020 1432   K 3.6 02/03/2021 1419   CL 101 02/03/2021 1419   CO2 27 02/03/2021 1419   GLUCOSE 111 (H) 02/03/2021 1419   BUN 13 02/03/2021 1419   BUN 11 10/06/2020 1432   CREATININE 0.77 02/03/2021 1419   CREATININE 0.91 12/17/2020 0831   CALCIUM 9.0 02/03/2021 1419   PROT 6.4 (L) 02/03/2021 1419   PROT 6.9 10/06/2020 1432   ALBUMIN 3.2 (L) 02/03/2021 1419   ALBUMIN 4.1 10/06/2020 1432   AST 10 (L) 02/03/2021 1419   AST 15 12/17/2020 0831   ALT 17 02/03/2021 1419   ALT 18 12/17/2020 0831   ALKPHOS 124 02/03/2021 1419   BILITOT 0.3 02/03/2021 1419   BILITOT 0.3 12/17/2020 0831   GFRNONAA >60 02/03/2021 1419   GFRNONAA >60 12/17/2020 0831   GFRAA >60 09/07/2019 1340    Lab Results  Component Value Date   TOTALPROTELP 7.0 12/31/2011   ALBUMINELP 56.4 12/31/2011   A1GS 4.3 12/31/2011   A2GS 10.4 12/31/2011   BETS 6.4 12/31/2011   BETA2SER 4.6 12/31/2011   GAMS 17.9 12/31/2011   MSPIKE NOT DETECTED 12/31/2011   SPEI (NOTE) 12/31/2011    Lab Results  Component Value Date   WBC 13.3 (H) 02/10/2021   NEUTROABS 8.0 (H) 02/10/2021   HGB 8.3 (L) 02/10/2021   HCT 26.0 (L) 02/10/2021   MCV 72.4 (L) 02/10/2021   PLT 241 02/10/2021    No results found for: LABCA2  No components found for: PZWCHE527  No results for input(s): INR in the last 168 hours.  No results found for: LABCA2  No  results found for: CAN199  No results found for: CAN125  No results found for: OEV035  No results found for: CA2729  No components found for: HGQUANT  No results found for: CEA1 / No results found for: CEA1   No results found for: AFPTUMOR  No results found for: [facility]  Lab Results  Component Value Date   KAPLAMBRATIO 10.08 12/31/2011   (kappa/lambda light  chains)  No results found for: HGBA, HGBA2QUANT, HGBFQUANT, HGBSQUAN (Hemoglobinopathy evaluation)   Lab Results  Component Value Date   LDH 194 12/31/2011    Lab Results  Component Value Date   IRON 95 12/31/2011   TIBC 325 12/31/2011   IRONPCTSAT 29 12/31/2011   (Iron and TIBC)  Lab Results  Component Value Date   FERRITIN 37 12/31/2011    Urinalysis    Component Value Date/Time   COLORURINE YELLOW 09/07/2019 1341   APPEARANCEUR HAZY (A) 09/07/2019 1341   LABSPEC 1.021 09/07/2019 1341   PHURINE 9.0 (H) 09/07/2019 1341   GLUCOSEU NEGATIVE 09/07/2019 1341   HGBUR NEGATIVE 09/07/2019 1341   BILIRUBINUR NEGATIVE 09/07/2019 1341   KETONESUR NEGATIVE 09/07/2019 1341   PROTEINUR 30 (A) 09/07/2019 1341   UROBILINOGEN 0.2 11/01/2013 1246   NITRITE NEGATIVE 09/07/2019 1341   LEUKOCYTESUR NEGATIVE 09/07/2019 1341    STUDIES: DG Chest 2 View  Result Date: 01/28/2021 CLINICAL DATA:  Chest pain. Hematemesis. Question aspiration. Breast cancer. EXAM: CHEST - 2 VIEW COMPARISON:  01/26/2021 FINDINGS: Heart size is mildly enlarged. Mediastinal shadows are normal. Power port inserted from the left has its tip in the SVC above the right atrium. The lungs are clear. No infiltrate, collapse or effusion. No significant bone finding. IMPRESSION: No active cardiopulmonary disease. Electronically Signed   By: Nelson Chimes M.D.   On: 01/28/2021 09:56   CT HEAD W & WO CONTRAST (5MM)  Addendum Date: 02/03/2021   ADDENDUM REPORT: 02/03/2021 15:00 ADDENDUM: Priors description of the study as being done without contrast were in error. On postcontrast imaging, no enhancing lesions are seen. No evidence of metastatic disease. Electronically Signed   By: Merilyn Baba M.D.   On: 02/03/2021 15:00   Result Date: 02/03/2021 CLINICAL DATA:  Breast cancer EXAM: CT HEAD WITHOUT AND WITH CONTRAST TECHNIQUE: Contiguous axial images were obtained from the base of the skull through the vertex without and with  intravenous contrast CONTRAST:  24m OMNIPAQUE IOHEXOL 350 MG/ML SOLN COMPARISON:  12/31/2011. FINDINGS: Brain: No evidence of acute infarction, hemorrhage, hydrocephalus, extra-axial collection or mass lesion/mass effect. Please note that evaluation for metastatic disease is limited in the absence of intravenous contrast. Vascular: No hyperdense vessel or unexpected calcification. Visible vessels are patent. Skull: Negative for fracture or suspicious lesion. Redemonstrated small lucency in the right frontal calvarium, unchanged compared to 2013. Sinuses/Orbits: No acute finding. Other: None. IMPRESSION: No acute intracranial process. Please note that evaluation for parenchymal metastatic disease is limited in the absence of intravenous contrast. Electronically Signed: By: AMerilyn BabaM.D. On: 02/02/2021 13:34   CT Soft Tissue Neck W Contrast  Result Date: 02/02/2021 CLINICAL DATA:  Neck mass initial workup; metastatic breast cancer, throat mass seen on intubation EXAM: CT NECK WITH CONTRAST TECHNIQUE: Multidetector CT imaging of the neck was performed using the standard protocol following the bolus administration of intravenous contrast. CONTRAST:  76mOMNIPAQUE IOHEXOL 350 MG/ML SOLN COMPARISON:  None FINDINGS: Pharynx and larynx: Unremarkable. There is no mass or swelling identified. Salivary glands: Parotid and submandibular glands are unremarkable. Thyroid: Unremarkable.  Lymph nodes: No enlarged or abnormal density nodes. Vascular: Partially imaged left chest wall port catheter. Major neck vessels are patent. Limited intracranial: Minimally included Visualized orbits: Unremarkable. Mastoids and visualized paranasal sinuses: Retention cyst or polyp of the right maxillary sinus. Skeleton: Lucent lesions of the included spine reflecting metastases. Upper chest: No apical lung mass. Other: Tooth decay with caries and periapical lucencies. IMPRESSION: No neck mass or adenopathy. Cervicothoracic spine  metastases. Electronically Signed   By: Macy Mis M.D.   On: 02/02/2021 19:54   DG CHEST PORT 1 VIEW  Result Date: 01/26/2021 CLINICAL DATA:  Status post left Port-A-Cath placement. EXAM: PORTABLE CHEST 1 VIEW COMPARISON:  May 28, 2020. FINDINGS: Stable cardiomegaly. Interval placement of left subclavian Port-A-Cath with distal tip in expected position of cavoatrial junction. No pneumothorax is noted. Lungs are clear. Bony thorax is unremarkable. IMPRESSION: Interval placement of left subclavian Port-A-Cath with distal tip in expected position of cavoatrial junction. Electronically Signed   By: Marijo Conception M.D.   On: 01/26/2021 11:19   DG C-Arm 1-60 Min-No Report  Result Date: 01/26/2021 Fluoroscopy was utilized by the requesting physician.  No radiographic interpretation.   ECHOCARDIOGRAM COMPLETE  Result Date: 01/22/2021    ECHOCARDIOGRAM REPORT   Patient Name:   Chelsea Hansen Date of Exam: 01/22/2021 Medical Rec #:  [id number]     Height:       71.0 in Accession #:    [id number]    Weight:       221.1 lb Date of Birth:  1970/12/17    BSA:          2.200 m Patient Age:    3 years      BP:           120/77 mmHg Patient Gender: F             HR:           66 bpm. Exam Location:  Inpatient Procedure: 2D Echo, Color Doppler, Cardiac Doppler and Strain Analysis Indications:    Pre-Chemo Evaluation  History:        Patient has prior history of Echocardiogram examinations, most                 recent 01/01/2012. Risk Factors:Breast Cancer.  Sonographer:    Raquel Sarna Senior RDCS Referring Phys: [place] Comments: Technically difficult due to poor echo windows. IMPRESSIONS  1. Left ventricular ejection fraction, by estimation, is 60 to 65%. The left ventricle has normal function. The left ventricle has no regional wall motion abnormalities. Left ventricular diastolic parameters were normal.  2. Right ventricular systolic function is normal. The right ventricular size is normal.   3. Left atrial size was mildly dilated.  4. The mitral valve is normal in structure. Trivial mitral valve regurgitation. No evidence of mitral stenosis.  5. The aortic valve was not well visualized. There is mild calcification of the aortic valve. Aortic valve regurgitation is not visualized. No aortic stenosis is present.  6. The inferior vena cava is normal in size with greater than 50% respiratory variability, suggesting right atrial pressure of 3 mmHg. FINDINGS  Left Ventricle: Left ventricular ejection fraction, by estimation, is 60 to 65%. The left ventricle has normal function. The left ventricle has no regional wall motion abnormalities. The left ventricular internal cavity size was normal in size. There is  no left ventricular hypertrophy. Left ventricular diastolic parameters were normal. Right Ventricle: The right ventricular  size is normal. No increase in right ventricular wall thickness. Right ventricular systolic function is normal. Left Atrium: Left atrial size was mildly dilated. Right Atrium: Right atrial size was normal in size. Pericardium: There is no evidence of pericardial effusion. Mitral Valve: The mitral valve is normal in structure. Trivial mitral valve regurgitation. No evidence of mitral valve stenosis. Tricuspid Valve: The tricuspid valve is normal in structure. Tricuspid valve regurgitation is trivial. No evidence of tricuspid stenosis. Aortic Valve: The aortic valve was not well visualized. There is mild calcification of the aortic valve. Aortic valve regurgitation is not visualized. No aortic stenosis is present. Pulmonic Valve: The pulmonic valve was normal in structure. Pulmonic valve regurgitation is not visualized. No evidence of pulmonic stenosis. Aorta: The aortic root is normal in size and structure. Venous: The inferior vena cava is normal in size with greater than 50% respiratory variability, suggesting right atrial pressure of 3 mmHg. IAS/Shunts: No atrial level shunt  detected by color flow Doppler.  LEFT VENTRICLE PLAX 2D LVIDd:         4.80 cm  Diastology LVIDs:         3.30 cm  LV e' medial:    6.74 cm/s LV PW:         1.20 cm  LV E/e' medial:  11.3 LV IVS:        1.10 cm  LV e' lateral:   8.16 cm/s LVOT diam:     2.00 cm  LV E/e' lateral: 9.4 LV SV:         77 LV SV Index:   35       2D Longitudinal Strain LVOT Area:     3.14 cm 2D Strain GLS Avg:     -20.8 %  RIGHT VENTRICLE RV S prime:     12.60 cm/s TAPSE (M-mode): 2.5 cm LEFT ATRIUM             Index       RIGHT ATRIUM           Index LA diam:        3.60 cm 1.64 cm/m  RA Area:     14.20 cm LA Vol (A2C):   50.3 ml 22.86 ml/m RA Volume:   32.80 ml  14.91 ml/m LA Vol (A4C):   58.7 ml 26.68 ml/m LA Biplane Vol: 55.8 ml 25.36 ml/m  AORTIC VALVE LVOT Vmax:   120.00 cm/s LVOT Vmean:  85.000 cm/s LVOT VTI:    0.245 m  AORTA Ao Root diam: 3.40 cm Ao Asc diam:  3.70 cm MITRAL VALVE MV Area (PHT): 3.16 cm    SHUNTS MV Decel Time: 240 msec    Systemic VTI:  0.24 m MV E velocity: 76.30 cm/s  Systemic Diam: 2.00 cm MV A velocity: 75.40 cm/s MV E/A ratio:  1.01 Glori Bickers MD Electronically signed by Glori Bickers MD Signature Date/Time: 01/22/2021/2:57:06 PM    Final      ELIGIBLE FOR AVAILABLE RESEARCH PROTOCOL: no  ASSESSMENT: 50 y.o. [place] woman status post bilateral breast biopsies 12/03/2020, showing  (1) on the right, a clinical T2 N1, stage IIa invasive lobular carcinoma, grade 2, estrogen and progesterone receptor strongly positive, HER2 not amplified, with an MIB-1 of 40%   (a) the biopsied right axillary lymph node was positive with extracapsular extension   (b) a second right breast mass also biopsied was a fibroadenoma, concordant   (c) biopsy of an area of non-mass-like enhancement in the upper right breast  pending   (2) on the left, a clinical T1b N0, stage IA invasive ductal carcinoma, grade 1 or 2, estrogen and progesterone receptor positive, HER2 not amplified, with an MIB 1 of 15%   (a)  biopsy of a 0.4 cm enhancement in the medial left breast pending  (3) genetics testing through the [facility] BRCAplus panel (report date 12/24/2020) or the CancerNext-Expanded + RNAinsight panel (report date 12/31/2020) found no deleterious mutations in ATM, BRCA1, BRCA2, CDH1, CHEK2, PALB2, PTEN, and TP53. The CancerNext-Expanded + RNAinsight gene panel offered by [employer] and includes sequencing and rearrangement analysis for the following 77 genes: AIP, ALK, APC, ATM, AXIN2, BAP1, BARD1, BLM, BMPR1A, BRCA1, BRCA2, BRIP1, CDC73, CDH1, CDK4, CDKN1B, CDKN2A, CHEK2, CTNNA1, DICER1, FANCC, FH, FLCN, GALNT12, KIF1B, LZTR1, MAX, MEN1, MET, MLH1, MSH2, MSH3, MSH6, MUTYH, NBN, NF1, NF2, NTHL1, PALB2, PHOX2B, PMS2, POT1, PRKAR1A, PTCH1, PTEN, RAD51C, RAD51D, RB1, RECQL, RET, SDHA, SDHAF2, SDHB, SDHC, SDHD, SMAD4, SMARCA4, SMARCB1, SMARCE1, STK11, SUFU, TMEM127, TP53, TSC1, TSC2, VHL and XRCC2 (sequencing and deletion/duplication); EGFR, EGLN1, HOXB13, KIT, MITF, PDGFRA, POLD1 and POLE (sequencing only); EPCAM and GREM1 (deletion/duplication only). RNA data is routinely analyzed for use in variant interpretation for all genes.  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  (5) definitive surgery to follow chemotherapy  (6) chemotherapy consisting of doxorubicin and cyclophosphamide in dose dense fashion x4 started 01/27/2021 to be followed by weekly paclitaxel x12.  (7) adjuvant radiation on the right, possibly also on the left  (8) antiestrogens to follow at the completion of local treatment  (9) oral mass noted at time of intubation for port placement:  (A) CT head and neck 02/02/2021 negative  (B) ENT evaluation Dr. Kandis Cocking 02/06/2021 benign note from her February 06 2021 visit notes mild mucositis,  thrush, and reflux.  When we see Lyan again we will start her on a PPI and Diflucan for thrush   PLAN: [name] had a very difficult time with her first cycle of this chemotherapy, largely because for what ever reason she did not take the recommended antinausea medications.  She has now a very clear understanding of how to take those medicines and has them on hand.  We have added olanzapine and she understands also how to take that medication.  The apparent mass on her upper left maxilla was evaluated by ENT and there is no concern.  ENT did find some evidence of mucositis and thrush as well as evidence of significant reflux and I am accordingly starting Ivalee on Diflucan and omeprazole.  She is scheduled for fluids the next 3 days and she knows to let us know if she needs further support.  I explained that we do not prescribe narcotics except for brief pain, perhaps postoperatively.  I suggested she try Tylenol and Aleve together and entered those scripts for her.  There is no reason she cannot take a nonsteroidal at present.  Total encounter time 40 minutes.Sarajane Jews C. Brant Peets, MD 02/10/2021 2:09 PM Medical Oncology and Hematology [facility] [place], [place] [address] Tel. [phone]    Fax. [phone]   This document serves as a record of services personally performed by Lurline Del, MD. It was created on his behalf by Wilburn Mylar, a trained medical scribe. The creation of this record is  based on the scribe's personal observations and the provider's statements to them.   I, Lurline Del MD, have reviewed the above documentation for accuracy and completeness, and I agree with the above.   *Total Encounter Time as defined by the Centers for Medicare and Medicaid Services includes, in addition to the face-to-face time of a patient visit (documented in the note above) non-face-to-face time: obtaining and reviewing outside history, ordering and  reviewing medications, tests or procedures, care coordination (communications with other health care professionals or caregivers) and documentation in the medical record.

## 2021-02-10 NOTE — Progress Notes (Signed)
Met with patient in infusion to complete grant paperwork.  Patient approved for one-time $1000 [employer] grant to assist with personal expenses while going through treatment. She has a copy of the approval letter and expense sheet along with the Outpatient pharmacy information. Discussed in detail expenses and how they are covered. She received a gift card today from grant.  She has paperwork and my card in green folder for any additional financial questions or concerns along with calendar.

## 2021-02-10 NOTE — Progress Notes (Unsigned)
Pt c/o headache at the end of cytoxan.  Dr. Jana Hakim ordered tylenol.

## 2021-02-10 NOTE — Progress Notes (Signed)
Met with patient at registration to introduce myself as Arboriculturist and to offer available resources.  Discussed one-time $1000 Radio broadcast assistant to assist with personal expenses while going through treatment. Advised what is needed to apply. She was able to provide document today.  Will see patient after appointment today to complete grant paperwork and provide updated calendar printed by registration.  She has my card to ask for me when she is done with appointments.

## 2021-02-11 ENCOUNTER — Inpatient Hospital Stay: Payer: Medicaid Other

## 2021-02-11 ENCOUNTER — Other Ambulatory Visit (INDEPENDENT_AMBULATORY_CARE_PROVIDER_SITE_OTHER): Payer: Self-pay | Admitting: Primary Care

## 2021-02-11 ENCOUNTER — Other Ambulatory Visit (HOSPITAL_COMMUNITY): Payer: Self-pay

## 2021-02-11 ENCOUNTER — Other Ambulatory Visit: Payer: Self-pay

## 2021-02-11 ENCOUNTER — Telehealth: Payer: Self-pay | Admitting: Pharmacist

## 2021-02-11 VITALS — BP 154/87 | HR 62 | Temp 97.5°F | Resp 17

## 2021-02-11 DIAGNOSIS — Z5111 Encounter for antineoplastic chemotherapy: Secondary | ICD-10-CM | POA: Diagnosis not present

## 2021-02-11 DIAGNOSIS — Z95828 Presence of other vascular implants and grafts: Secondary | ICD-10-CM

## 2021-02-11 DIAGNOSIS — G894 Chronic pain syndrome: Secondary | ICD-10-CM

## 2021-02-11 LAB — CANCER ANTIGEN 27.29: CA 27.29: 66.9 U/mL — ABNORMAL HIGH (ref 0.0–38.6)

## 2021-02-11 MED ORDER — HEPARIN SOD (PORK) LOCK FLUSH 100 UNIT/ML IV SOLN
500.0000 [IU] | Freq: Once | INTRAVENOUS | Status: AC
  Administered 2021-02-11: 500 [IU]

## 2021-02-11 MED ORDER — SODIUM CHLORIDE 0.9% FLUSH
10.0000 mL | Freq: Once | INTRAVENOUS | Status: AC
  Administered 2021-02-11: 10 mL

## 2021-02-11 MED ORDER — SODIUM CHLORIDE 0.9 % IV SOLN: INTRAVENOUS | Status: DC

## 2021-02-11 NOTE — Patient Instructions (Signed)
Dehydration, Adult ?Dehydration is condition in which there is not enough water or other fluids in the body. This happens when a person loses more fluids than he or she takes in. Important body parts cannot work right without the right amount of fluids. Any loss of fluids from the body can cause dehydration. ?Dehydration can be mild, worse, or very bad. It should be treated right away to keep it from getting very bad. ?What are the causes? ?This condition may be caused by: ?Conditions that cause loss of water or other fluids, such as: ?Watery poop (diarrhea). ?Vomiting. ?Sweating a lot. ?Peeing (urinating) a lot. ?Not drinking enough fluids, especially when you: ?Are ill. ?Are doing things that take a lot of energy to do. ?Other illnesses and conditions, such as fever or infection. ?Certain medicines, such as medicines that take extra fluid out of the body (diuretics). ?Lack of safe drinking water. ?Not being able to get enough water and food. ?What increases the risk? ?The following factors may make you more likely to develop this condition: ?Having a long-term (chronic) illness that has not been treated the right way, such as: ?Diabetes. ?Heart disease. ?Kidney disease. ?Being 65 years of age or older. ?Having a disability. ?Living in a place that is high above the ground or sea (high in altitude). The thinner, dried air causes more fluid loss. ?Doing exercises that put stress on your body for a long time. ?What are the signs or symptoms? ?Symptoms of dehydration depend on how bad it is. ?Mild or worse dehydration ?Thirst. ?Dry lips or dry mouth. ?Feeling dizzy or light-headed, especially when you stand up from sitting. ?Muscle cramps. ?Your body making: ?Dark pee (urine). Pee may be the color of tea. ?Less pee than normal. ?Less tears than normal. ?Headache. ?Very bad dehydration ?Changes in skin. Skin may: ?Be cold to the touch (clammy). ?Be blotchy or pale. ?Not go back to normal right after you lightly pinch  it and let it go. ?Little or no tears, pee, or sweat. ?Changes in vital signs, such as: ?Fast breathing. ?Low blood pressure. ?Weak pulse. ?Pulse that is more than 100 beats a minute when you are sitting still. ?Other changes, such as: ?Feeling very thirsty. ?Eyes that look hollow (sunken). ?Cold hands and feet. ?Being mixed up (confused). ?Being very tired (lethargic) or having trouble waking from sleep. ?Short-term weight loss. ?Loss of consciousness. ?How is this treated? ?Treatment for this condition depends on how bad it is. Treatment should start right away. Do not wait until your condition gets very bad. Very bad dehydration is an emergency. You will need to go to a hospital. ?Mild or worse dehydration can be treated at home. You may be asked to: ?Drink more fluids. ?Drink an oral rehydration solution (ORS). This drink helps get the right amounts of fluids and salts and minerals in the blood (electrolytes). ?Very bad dehydration can be treated: ?With fluids through an IV tube. ?By getting normal levels of salts and minerals in your blood. This is often done by giving salts and minerals through a tube. The tube is passed through your nose and into your stomach. ?By treating the root cause. ?Follow these instructions at home: ?Oral rehydration solution ?If told by your doctor, drink an ORS: ?Make an ORS. Use instructions on the package. ?Start by drinking small amounts, about ? cup (120 mL) every 5-10 minutes. ?Slowly drink more until you have had the amount that your doctor said to have. ?Eating and drinking ?  ?  ?  Drink enough clear fluid to keep your pee pale yellow. If you were told to drink an ORS, finish the ORS first. Then, start slowly drinking other clear fluids. Drink fluids such as: ?Water. Do not drink only water. Doing that can make the salt (sodium) level in your body get too low. ?Water from ice chips you suck on. ?Fruit juice that you have added water to (diluted). ?Low-calorie sports  drinks. ?Eat foods that have the right amounts of salts and minerals, such as: ?Bananas. ?Oranges. ?Potatoes. ?Tomatoes. ?Spinach. ?Do not drink alcohol. ?Avoid: ?Drinks that have a lot of sugar. These include: ?High-calorie sports drinks. ?Fruit juice that you did not add water to. ?Soda. ?Caffeine. ?Foods that are greasy or have a lot of fat or sugar. ?General instructions ?Take over-the-counter and prescription medicines only as told by your doctor. ?Do not take salt tablets. Doing that can make the salt level in your body get too high. ?Return to your normal activities as told by your doctor. Ask your doctor what activities are safe for you. ?Keep all follow-up visits as told by your doctor. This is important. ?Contact a doctor if: ?You have pain in your belly (abdomen) and the pain: ?Gets worse. ?Stays in one place. ?You have a rash. ?You have a stiff neck. ?You get angry or annoyed (irritable) more easily than normal. ?You are more tired or have a harder time waking than normal. ?You feel: ?Weak or dizzy. ?Very thirsty. ?Get help right away if you have: ?Any symptoms of very bad dehydration. ?Symptoms of vomiting, such as: ?You cannot eat or drink without vomiting. ?Your vomiting gets worse or does not go away. ?Your vomit has blood or green stuff in it. ?Symptoms that get worse with treatment. ?A fever. ?A very bad headache. ?Problems with peeing or pooping (having a bowel movement), such as: ?Watery poop that gets worse or does not go away. ?Blood in your poop (stool). This may cause poop to look black and tarry. ?Not peeing in 6-8 hours. ?Peeing only a small amount of very dark pee in 6-8 hours. ?Trouble breathing. ?These symptoms may be an emergency. Do not wait to see if the symptoms will go away. Get medical help right away. Call your local emergency services (911 in the [place] Do not drive yourself to the hospital. ?Summary ?Dehydration is a condition in which there is not enough water or other fluids  in the body. This happens when a person loses more fluids than he or she takes in. ?Treatment for this condition depends on how bad it is. Treatment should be started right away. Do not wait until your condition gets very bad. ?Drink enough clear fluid to keep your pee pale yellow. If you were told to drink an oral rehydration solution (ORS), finish the ORS first. Then, start slowly drinking other clear fluids. ?Take over-the-counter and prescription medicines only as told by your doctor. ?Get help right away if you have any symptoms of very bad dehydration. ?This information is not intended to replace advice given to you by your health care provider. Make sure you discuss any questions you have with your health care provider. ?Document Revised: 01/18/2019 Document Reviewed: 01/18/2019 ?Elsevier Patient Education ? 2022 Elsevier Inc. ? ?

## 2021-02-11 NOTE — Telephone Encounter (Signed)
[place] at [facility] [phone] [phone]        Oncology Clinical Pharmacist Practitioner Note  Chelsea Hansen is a 50 y.o. female with a diagnosis of breast cancer currently on dose dense AC under the care of Dr. Lurline Del.  Patient was seen by Dr. Jana Hakim yesterday and received cycle 2 of dose dense AC.  Clinical pharmacy contacted patient to review the olanzapine instructions and inquire if there were any questions with the plan.  Chelsea Hansen understands that she is to start olanzapine tonight for 3 days and repeat this same sequence for the remaining cycles of dose dense AC.  She is scheduled for IVF and peg-filgrastim tomorrow.  She knows to contact the triage line at [phone] should she have any new or worsening symptoms. Chelsea Hansen participated in the discussion, expressed understanding, and voiced agreement with the above plan. All questions were answered to her satisfaction. The patient was advised to contact the clinic with any questions or concerns prior to her return visit.   Clinical pharmacy will continue to support Chelsea Hansen  and Dr. Jana Hakim as needed.  Raina Mina, RPH-CPP,  02/11/2021  3:00 PM

## 2021-02-12 ENCOUNTER — Inpatient Hospital Stay: Payer: Medicaid Other

## 2021-02-12 DIAGNOSIS — C50411 Malignant neoplasm of upper-outer quadrant of right female breast: Secondary | ICD-10-CM

## 2021-02-12 DIAGNOSIS — Z5111 Encounter for antineoplastic chemotherapy: Secondary | ICD-10-CM | POA: Diagnosis not present

## 2021-02-12 DIAGNOSIS — Z95828 Presence of other vascular implants and grafts: Secondary | ICD-10-CM

## 2021-02-12 DIAGNOSIS — Z170 Estrogen receptor positive status [ER+]: Secondary | ICD-10-CM

## 2021-02-12 MED ORDER — SODIUM CHLORIDE 0.9 % IV SOLN: INTRAVENOUS | Status: DC

## 2021-02-12 MED ORDER — PEGFILGRASTIM-CBQV 6 MG/0.6ML ~~LOC~~ SOSY
6.0000 mg | PREFILLED_SYRINGE | Freq: Once | SUBCUTANEOUS | Status: AC
  Administered 2021-02-12: 6 mg via SUBCUTANEOUS
  Filled 2021-02-12: qty 0.6

## 2021-02-12 NOTE — Telephone Encounter (Signed)
Patient aware that PCP is unable to prescribe anything other than tramadol. Has placed referral for pain management. She is aware that their office will contact her directly to schedule.

## 2021-02-12 NOTE — Patient Instructions (Signed)
Dehydration, Adult ?Dehydration is condition in which there is not enough water or other fluids in the body. This happens when a person loses more fluids than he or she takes in. Important body parts cannot work right without the right amount of fluids. Any loss of fluids from the body can cause dehydration. ?Dehydration can be mild, worse, or very bad. It should be treated right away to keep it from getting very bad. ?What are the causes? ?This condition may be caused by: ?Conditions that cause loss of water or other fluids, such as: ?Watery poop (diarrhea). ?Vomiting. ?Sweating a lot. ?Peeing (urinating) a lot. ?Not drinking enough fluids, especially when you: ?Are ill. ?Are doing things that take a lot of energy to do. ?Other illnesses and conditions, such as fever or infection. ?Certain medicines, such as medicines that take extra fluid out of the body (diuretics). ?Lack of safe drinking water. ?Not being able to get enough water and food. ?What increases the risk? ?The following factors may make you more likely to develop this condition: ?Having a long-term (chronic) illness that has not been treated the right way, such as: ?Diabetes. ?Heart disease. ?Kidney disease. ?Being 65 years of age or older. ?Having a disability. ?Living in a place that is high above the ground or sea (high in altitude). The thinner, dried air causes more fluid loss. ?Doing exercises that put stress on your body for a long time. ?What are the signs or symptoms? ?Symptoms of dehydration depend on how bad it is. ?Mild or worse dehydration ?Thirst. ?Dry lips or dry mouth. ?Feeling dizzy or light-headed, especially when you stand up from sitting. ?Muscle cramps. ?Your body making: ?Dark pee (urine). Pee may be the color of tea. ?Less pee than normal. ?Less tears than normal. ?Headache. ?Very bad dehydration ?Changes in skin. Skin may: ?Be cold to the touch (clammy). ?Be blotchy or pale. ?Not go back to normal right after you lightly pinch  it and let it go. ?Little or no tears, pee, or sweat. ?Changes in vital signs, such as: ?Fast breathing. ?Low blood pressure. ?Weak pulse. ?Pulse that is more than 100 beats a minute when you are sitting still. ?Other changes, such as: ?Feeling very thirsty. ?Eyes that look hollow (sunken). ?Cold hands and feet. ?Being mixed up (confused). ?Being very tired (lethargic) or having trouble waking from sleep. ?Short-term weight loss. ?Loss of consciousness. ?How is this treated? ?Treatment for this condition depends on how bad it is. Treatment should start right away. Do not wait until your condition gets very bad. Very bad dehydration is an emergency. You will need to go to a hospital. ?Mild or worse dehydration can be treated at home. You may be asked to: ?Drink more fluids. ?Drink an oral rehydration solution (ORS). This drink helps get the right amounts of fluids and salts and minerals in the blood (electrolytes). ?Very bad dehydration can be treated: ?With fluids through an IV tube. ?By getting normal levels of salts and minerals in your blood. This is often done by giving salts and minerals through a tube. The tube is passed through your nose and into your stomach. ?By treating the root cause. ?Follow these instructions at home: ?Oral rehydration solution ?If told by your doctor, drink an ORS: ?Make an ORS. Use instructions on the package. ?Start by drinking small amounts, about ? cup (120 mL) every 5-10 minutes. ?Slowly drink more until you have had the amount that your doctor said to have. ?Eating and drinking ?  ?  ?  Drink enough clear fluid to keep your pee pale yellow. If you were told to drink an ORS, finish the ORS first. Then, start slowly drinking other clear fluids. Drink fluids such as: ?Water. Do not drink only water. Doing that can make the salt (sodium) level in your body get too low. ?Water from ice chips you suck on. ?Fruit juice that you have added water to (diluted). ?Low-calorie sports  drinks. ?Eat foods that have the right amounts of salts and minerals, such as: ?Bananas. ?Oranges. ?Potatoes. ?Tomatoes. ?Spinach. ?Do not drink alcohol. ?Avoid: ?Drinks that have a lot of sugar. These include: ?High-calorie sports drinks. ?Fruit juice that you did not add water to. ?Soda. ?Caffeine. ?Foods that are greasy or have a lot of fat or sugar. ?General instructions ?Take over-the-counter and prescription medicines only as told by your doctor. ?Do not take salt tablets. Doing that can make the salt level in your body get too high. ?Return to your normal activities as told by your doctor. Ask your doctor what activities are safe for you. ?Keep all follow-up visits as told by your doctor. This is important. ?Contact a doctor if: ?You have pain in your belly (abdomen) and the pain: ?Gets worse. ?Stays in one place. ?You have a rash. ?You have a stiff neck. ?You get angry or annoyed (irritable) more easily than normal. ?You are more tired or have a harder time waking than normal. ?You feel: ?Weak or dizzy. ?Very thirsty. ?Get help right away if you have: ?Any symptoms of very bad dehydration. ?Symptoms of vomiting, such as: ?You cannot eat or drink without vomiting. ?Your vomiting gets worse or does not go away. ?Your vomit has blood or green stuff in it. ?Symptoms that get worse with treatment. ?A fever. ?A very bad headache. ?Problems with peeing or pooping (having a bowel movement), such as: ?Watery poop that gets worse or does not go away. ?Blood in your poop (stool). This may cause poop to look black and tarry. ?Not peeing in 6-8 hours. ?Peeing only a small amount of very dark pee in 6-8 hours. ?Trouble breathing. ?These symptoms may be an emergency. Do not wait to see if the symptoms will go away. Get medical help right away. Call your local emergency services (911 in the [place] Do not drive yourself to the hospital. ?Summary ?Dehydration is a condition in which there is not enough water or other fluids  in the body. This happens when a person loses more fluids than he or she takes in. ?Treatment for this condition depends on how bad it is. Treatment should be started right away. Do not wait until your condition gets very bad. ?Drink enough clear fluid to keep your pee pale yellow. If you were told to drink an oral rehydration solution (ORS), finish the ORS first. Then, start slowly drinking other clear fluids. ?Take over-the-counter and prescription medicines only as told by your doctor. ?Get help right away if you have any symptoms of very bad dehydration. ?This information is not intended to replace advice given to you by your health care provider. Make sure you discuss any questions you have with your health care provider. ?Document Revised: 01/18/2019 Document Reviewed: 01/18/2019 ?Elsevier Patient Education ? 2022 Elsevier Inc. ? ?

## 2021-02-13 ENCOUNTER — Inpatient Hospital Stay: Payer: Medicaid Other

## 2021-02-13 ENCOUNTER — Other Ambulatory Visit: Payer: Self-pay

## 2021-02-13 DIAGNOSIS — C50411 Malignant neoplasm of upper-outer quadrant of right female breast: Secondary | ICD-10-CM

## 2021-02-13 DIAGNOSIS — Z170 Estrogen receptor positive status [ER+]: Secondary | ICD-10-CM

## 2021-02-13 DIAGNOSIS — Z5111 Encounter for antineoplastic chemotherapy: Secondary | ICD-10-CM | POA: Diagnosis not present

## 2021-02-13 MED ORDER — SODIUM CHLORIDE 0.9 % IV SOLN: INTRAVENOUS | Status: DC

## 2021-02-17 ENCOUNTER — Encounter: Payer: Self-pay | Admitting: *Deleted

## 2021-02-18 ENCOUNTER — Other Ambulatory Visit (HOSPITAL_COMMUNITY): Payer: Self-pay

## 2021-02-20 ENCOUNTER — Other Ambulatory Visit: Payer: Self-pay

## 2021-02-20 ENCOUNTER — Ambulatory Visit (INDEPENDENT_AMBULATORY_CARE_PROVIDER_SITE_OTHER): Payer: Medicaid Other | Admitting: Primary Care

## 2021-02-20 ENCOUNTER — Other Ambulatory Visit (HOSPITAL_COMMUNITY): Payer: Self-pay

## 2021-02-20 ENCOUNTER — Encounter (INDEPENDENT_AMBULATORY_CARE_PROVIDER_SITE_OTHER): Payer: Self-pay | Admitting: Primary Care

## 2021-02-20 VITALS — BP 143/88 | HR 58 | Temp 97.5°F | Ht 71.0 in | Wt 216.8 lb

## 2021-02-20 DIAGNOSIS — G894 Chronic pain syndrome: Secondary | ICD-10-CM | POA: Diagnosis not present

## 2021-02-20 DIAGNOSIS — F330 Major depressive disorder, recurrent, mild: Secondary | ICD-10-CM

## 2021-02-20 DIAGNOSIS — I10 Essential (primary) hypertension: Secondary | ICD-10-CM

## 2021-02-20 DIAGNOSIS — J4541 Moderate persistent asthma with (acute) exacerbation: Secondary | ICD-10-CM

## 2021-02-20 DIAGNOSIS — J320 Chronic maxillary sinusitis: Secondary | ICD-10-CM

## 2021-02-20 MED ORDER — FLUCONAZOLE 150 MG PO TABS
150.0000 mg | ORAL_TABLET | Freq: Once | ORAL | 0 refills | Status: AC
  Filled 2021-02-20: qty 1, 1d supply, fill #0

## 2021-02-20 MED ORDER — DOXYCYCLINE HYCLATE 100 MG PO TABS
100.0000 mg | ORAL_TABLET | Freq: Two times a day (BID) | ORAL | 0 refills | Status: DC
Start: 2021-02-20 — End: 2021-03-24
  Filled 2021-02-20: qty 10, 5d supply, fill #0

## 2021-02-20 MED ORDER — MONTELUKAST SODIUM 10 MG PO TABS
10.0000 mg | ORAL_TABLET | Freq: Every day | ORAL | 3 refills | Status: DC
Start: 2021-02-20 — End: 2023-04-18
  Filled 2021-02-20: qty 90, 90d supply, fill #0
  Filled 2021-07-08: qty 90, 90d supply, fill #1
  Filled 2021-10-27: qty 90, 90d supply, fill #2
  Filled 2022-01-22: qty 90, 90d supply, fill #3

## 2021-02-20 MED ORDER — AMLODIPINE BESYLATE 10 MG PO TABS
10.0000 mg | ORAL_TABLET | Freq: Every day | ORAL | 1 refills | Status: DC
  Filled 2021-02-20: qty 90, 90d supply, fill #0
  Filled 2021-07-08: qty 90, 90d supply, fill #1

## 2021-02-20 MED ORDER — ESCITALOPRAM OXALATE 20 MG PO TABS
20.0000 mg | ORAL_TABLET | Freq: Every day | ORAL | 1 refills | Status: DC
  Filled 2021-02-20: qty 90, 90d supply, fill #0
  Filled 2021-07-08: qty 90, 90d supply, fill #1

## 2021-02-20 NOTE — Progress Notes (Signed)
[facility]   Subjective:     Chelsea Hansen is a 50 y.o. female who presents for evaluation of chronic sinus problems. Symptoms include allergies and post nasal drip. Patient has had approximately 1 acute sinus infections in the past year. Patient admits to a history of allergic rhinitis. Patient admits to chronic use of OTC nasal decongestant sprays. Previous treatment has included oral decongestants, which have given minimal relief. Patient is a non-smoker.  The following portions of the patient's history were reviewed and updated as appropriate: allergies, current medications, past family history, past medical history, past social history, past surgical history, and problem list. Increase pain with chemo dx breast cancer and weekly treatment.  Review of Systems Pertinent items noted in HPI and remainder of comprehensive ROS otherwise negative.    Objective:    BP (!) 143/88 (BP Location: Right Arm, Patient Position: Sitting, Cuff Size: Normal)   Pulse (!) 58   Temp (!) 97.5 F (36.4 C) (Temporal)   Ht '5\' 11"'$  (1.803 m)   Wt 216 lb 12.8 oz (98.3 kg)   SpO2 99%   BMI 30.24 kg/m   General Appearance:    Alert, cooperative, no distress, appears stated age  Head:    Normocephalic, without obvious abnormality, atraumatic  Eyes:    PERRL, conjunctiva/corneas clear, EOM's intact, fundi    benign, both eyes  Ears:    Normal TM's and external ear canals, both ears  Nose:   Nares normal, septum midline, mucosa normal, no drainage    or sinus tenderness  Throat:   Lips, mucosa, and tongue normal; teeth and gums normal  Neck:   Supple, symmetrical, trachea midline, no adenopathy;    thyroid:  no enlargement/tenderness/nodules; no carotid   bruit or JVD  Back:     Symmetric, no curvature, ROM normal, no CVA tenderness  Lungs:     Clear to auscultation bilaterally, respirations unlabored  Chest Wall:    No tenderness or deformity   Heart:    Regular rate and rhythm, S1 and  S2 normal, no murmur, rub   or gallop  Breast Exam:    Defer tender  Abdomen:     Soft, non-tender, bowel sounds active all four quadrants,    no masses, no organomegaly  Genitalia:  Defer  Rectal:  Defer  Extremities:   Extremities normal, atraumatic, no cyanosis or edema  Pulses:   2+ and symmetric all extremities  Skin:   Skin color, texture, turgor normal, no rashes or lesions  Lymph nodes:   Cervical, supraclavicular, and axillary nodes normal  Neurologic:    normal strength, sensation and reflexes throughout      Assessment:  Chelsea Hansen was seen today for sinusitis and nail problem.  Diagnoses and all orders for this visit:  Chronic pain syndrome Referral previously made and office will contact her for appointment and gave her the information to reach office  -     Cancel: Drug Screen, Urine -     Cancel: Ambulatory referral to Pain Clinic  Chronic maxillary sinusitis -     doxycycline (VIBRA-TABS) 100 MG tablet; Take 1 tablet (100 mg total) by mouth 2 (two) times daily. -     fluconazole (DIFLUCAN) 150 MG tablet; Take 1 tablet (150 mg total) by mouth once for 1 dose.  Moderate persistent asthma with acute exacerbation -     montelukast (SINGULAIR) 10 MG tablet; Take 1 tablet (10 mg total) by mouth at bedtime.  Essential hypertension, benign  Counseled on blood pressure goal of less than 130/80, low-sodium, DASH diet, medication compliance, 150 minutes of moderate intensity exercise per week. Discussed medication compliance, adverse effects.  -     amLODipine (NORVASC) 10 MG tablet; Take 1 tablet (10 mg total) by mouth daily.  Mild episode of recurrent major depressive disorder ([place]) Chemo treatment is very difficult on her mentally and physically- she has lost her hair (self images issues) -     escitalopram (LEXAPRO) 20 MG tablet; Take 1 tablet (20 mg total) by mouth daily. Will refer to CSW   This note has been created with Human resources officer. Any transcriptional errors are unintentional.  [name] NP

## 2021-02-24 MED FILL — Dexamethasone Sodium Phosphate Inj 100 MG/10ML: INTRAMUSCULAR | Qty: 1 | Status: AC

## 2021-02-24 MED FILL — Fosaprepitant Dimeglumine For IV Infusion 150 MG (Base Eq): INTRAVENOUS | Qty: 5 | Status: AC

## 2021-02-25 ENCOUNTER — Inpatient Hospital Stay: Payer: Medicaid Other | Attending: Oncology

## 2021-02-25 ENCOUNTER — Other Ambulatory Visit: Payer: Self-pay

## 2021-02-25 ENCOUNTER — Inpatient Hospital Stay: Payer: Medicaid Other

## 2021-02-25 ENCOUNTER — Ambulatory Visit (HOSPITAL_BASED_OUTPATIENT_CLINIC_OR_DEPARTMENT_OTHER): Payer: Medicaid Other | Admitting: Adult Health

## 2021-02-25 ENCOUNTER — Encounter: Payer: Self-pay | Admitting: Licensed Clinical Social Worker

## 2021-02-25 VITALS — BP 132/93 | HR 66 | Temp 97.6°F | Resp 18 | Wt 216.5 lb

## 2021-02-25 DIAGNOSIS — C50411 Malignant neoplasm of upper-outer quadrant of right female breast: Secondary | ICD-10-CM

## 2021-02-25 DIAGNOSIS — K1230 Oral mucositis (ulcerative), unspecified: Secondary | ICD-10-CM | POA: Diagnosis not present

## 2021-02-25 DIAGNOSIS — Z95828 Presence of other vascular implants and grafts: Secondary | ICD-10-CM

## 2021-02-25 DIAGNOSIS — C50412 Malignant neoplasm of upper-outer quadrant of left female breast: Secondary | ICD-10-CM

## 2021-02-25 DIAGNOSIS — Z170 Estrogen receptor positive status [ER+]: Secondary | ICD-10-CM | POA: Diagnosis not present

## 2021-02-25 DIAGNOSIS — Z5189 Encounter for other specified aftercare: Secondary | ICD-10-CM | POA: Diagnosis not present

## 2021-02-25 DIAGNOSIS — Z5111 Encounter for antineoplastic chemotherapy: Secondary | ICD-10-CM | POA: Insufficient documentation

## 2021-02-25 LAB — CBC WITH DIFFERENTIAL/PLATELET
Abs Immature Granulocytes: 1.33 10*3/uL — ABNORMAL HIGH (ref 0.00–0.07)
Basophils Absolute: 0.1 10*3/uL (ref 0.0–0.1)
Basophils Relative: 1 %
Eosinophils Absolute: 0 10*3/uL (ref 0.0–0.5)
Eosinophils Relative: 0 %
HCT: 26.1 % — ABNORMAL LOW (ref 36.0–46.0)
Hemoglobin: 8.1 g/dL — ABNORMAL LOW (ref 12.0–15.0)
Immature Granulocytes: 13 %
Lymphocytes Relative: 17 %
Lymphs Abs: 1.7 10*3/uL (ref 0.7–4.0)
MCH: 22.8 pg — ABNORMAL LOW (ref 26.0–34.0)
MCHC: 31 g/dL (ref 30.0–36.0)
MCV: 73.5 fL — ABNORMAL LOW (ref 80.0–100.0)
Monocytes Absolute: 1.2 10*3/uL — ABNORMAL HIGH (ref 0.1–1.0)
Monocytes Relative: 12 %
Neutro Abs: 5.6 10*3/uL (ref 1.7–7.7)
Neutrophils Relative %: 57 %
Platelets: 348 10*3/uL (ref 150–400)
RBC: 3.55 MIL/uL — ABNORMAL LOW (ref 3.87–5.11)
RDW: 18.7 % — ABNORMAL HIGH (ref 11.5–15.5)
WBC: 10 10*3/uL (ref 4.0–10.5)
nRBC: 0.4 % — ABNORMAL HIGH (ref 0.0–0.2)

## 2021-02-25 LAB — COMPREHENSIVE METABOLIC PANEL
ALT: 15 U/L (ref 0–44)
AST: 13 U/L — ABNORMAL LOW (ref 15–41)
Albumin: 3.6 g/dL (ref 3.5–5.0)
Alkaline Phosphatase: 135 U/L — ABNORMAL HIGH (ref 38–126)
Anion gap: 12 (ref 5–15)
BUN: 12 mg/dL (ref 6–20)
CO2: 22 mmol/L (ref 22–32)
Calcium: 9 mg/dL (ref 8.9–10.3)
Chloride: 107 mmol/L (ref 98–111)
Creatinine, Ser: 0.83 mg/dL (ref 0.44–1.00)
GFR, Estimated: >60 mL/min (ref >60)
Glucose, Bld: 98 mg/dL (ref 70–99)
Potassium: 3.4 mmol/L — ABNORMAL LOW (ref 3.5–5.1)
Sodium: 141 mmol/L (ref 135–145)
Total Bilirubin: <0.2 mg/dL — ABNORMAL LOW (ref 0.3–1.2)
Total Protein: 6.6 g/dL (ref 6.5–8.1)

## 2021-02-25 MED ORDER — DOXORUBICIN HCL CHEMO IV INJECTION 2 MG/ML
60.0000 mg/m2 | Freq: Once | INTRAVENOUS | Status: AC
  Administered 2021-02-25: 134 mg via INTRAVENOUS
  Filled 2021-02-25: qty 67

## 2021-02-25 MED ORDER — SODIUM CHLORIDE 0.9% FLUSH
10.0000 mL | INTRAVENOUS | Status: DC | PRN
  Administered 2021-02-25: 10 mL

## 2021-02-25 MED ORDER — SODIUM CHLORIDE 0.9 % IV SOLN: INTRAVENOUS | Status: DC

## 2021-02-25 MED ORDER — SODIUM CHLORIDE 0.9 % IV SOLN: Freq: Once | INTRAVENOUS | Status: DC

## 2021-02-25 MED ORDER — SODIUM CHLORIDE 0.9 % IV SOLN
150.0000 mg | Freq: Once | INTRAVENOUS | Status: AC
  Administered 2021-02-25: 150 mg via INTRAVENOUS
  Filled 2021-02-25: qty 150

## 2021-02-25 MED ORDER — SODIUM CHLORIDE 0.9% FLUSH
10.0000 mL | Freq: Once | INTRAVENOUS | Status: AC
  Administered 2021-02-25: 10 mL

## 2021-02-25 MED ORDER — PALONOSETRON HCL INJECTION 0.25 MG/5ML
0.2500 mg | Freq: Once | INTRAVENOUS | Status: AC
  Administered 2021-02-25: 0.25 mg via INTRAVENOUS
  Filled 2021-02-25: qty 5

## 2021-02-25 MED ORDER — OLANZAPINE 5 MG PO TABS
5.0000 mg | ORAL_TABLET | Freq: Once | ORAL | Status: AC
  Administered 2021-02-25: 5 mg via ORAL
  Filled 2021-02-25: qty 1

## 2021-02-25 MED ORDER — HEPARIN SOD (PORK) LOCK FLUSH 100 UNIT/ML IV SOLN
500.0000 [IU] | Freq: Once | INTRAVENOUS | Status: AC | PRN
  Administered 2021-02-25: 500 [IU]

## 2021-02-25 MED ORDER — SODIUM CHLORIDE 0.9 % IV SOLN
10.0000 mg | Freq: Once | INTRAVENOUS | Status: AC
  Administered 2021-02-25: 10 mg via INTRAVENOUS
  Filled 2021-02-25: qty 10

## 2021-02-25 MED ORDER — SODIUM CHLORIDE 0.9 % IV SOLN
600.0000 mg/m2 | Freq: Once | INTRAVENOUS | Status: AC
  Administered 2021-02-25: 1340 mg via INTRAVENOUS
  Filled 2021-02-25: qty 67

## 2021-02-25 NOTE — Progress Notes (Signed)
Ok to proceed with treatment today without provider visit per Dr. Jana Hakim. Patient has no issues.  Raul Del [place], [place], BCPS, BCOP 02/25/2021 9:17 AM

## 2021-02-25 NOTE — Progress Notes (Signed)
[place]  [phone] [phone]     ID: Chelsea Hansen DOB: 1971/02/16  MR#: [id number]  WKG#:[id number]  Patient Care Team: [name], NP as PCP - General (Internal Medicine) [name], RN as Oncology Nurse Navigator [name], RN as Oncology Nurse Navigator [name], MD as Consulting Physician (Radiation Oncology) Magrinat, Virgie Dad, MD as Consulting Physician (Oncology) [name], MD as Consulting Physician (General Surgery) Scot Dock, NP OTHER MD:  CHIEF COMPLAINT: Bilateral estrogen receptor positive breast cancer  CURRENT TREATMENT:  neoadjuvant chemotherapy   INTERVAL HISTORY: Chelsea Hansen returns today for follow up and treatment of her bilateral breast cancer.   She has started on neoadjuvant chemotherapy, consisting of doxorubicin and cyclophosphamide in dose dense fashion x4, first dose 01/27/2021.  Today is day 1 of cycle 3 of treatment.  She is tolerating this moderately well.  She has had some issues with pain, in her body, however she is managing it well.  She notes some mild hot and cold feeling since starting her anti estrogen therapy as well.    REVIEW OF SYSTEMS: Review of Systems  Constitutional:  Positive for fatigue. Negative for appetite change, chills, fever and unexpected weight change.  HENT:   Negative for hearing loss, lump/mass, mouth sores, sore throat and trouble swallowing.   Eyes:  Negative for eye problems and icterus.  Respiratory:  Negative for chest tightness, cough and shortness of breath.   Cardiovascular:  Negative for chest pain, leg swelling and palpitations.  Gastrointestinal:  Negative for abdominal distention, abdominal pain, constipation, diarrhea, nausea and vomiting.  Endocrine: Positive for hot flashes.  Genitourinary:  Negative for difficulty urinating.   Musculoskeletal:  Negative for arthralgias.  Skin:  Negative for itching and rash.  Neurological:   Negative for dizziness, extremity weakness, headaches and numbness.  Hematological:  Negative for adenopathy. Does not bruise/bleed easily.  Psychiatric/Behavioral:  Negative for depression. The patient is not nervous/anxious.      COVID 19 VACCINATION STATUS: Chelsea Hansen x2, most recently 10/2020, no booster as of June 2022   HISTORY OF CURRENT ILLNESS: From the original intake note:  Chelsea Hansen has a history of excisional biopsy of a fibroadenoma from the lower outer right breast in 2011.  More recently she noted a knot in her right breast.  She brought it to medical attention and proceeded to bilateral diagnostic mammography with tomography and bilateral breast ultrasonography at [facility] on 11/18/2020 showing: breast density category B;   RIGHT: 3.2 cm mass associated with architectural distortion and scattered calcifications in upper-outer right breast at 10 o'clock, corresponding to palpable concern; indeterminate 1.1 cm mass in upper-outer right breast at 9:30; solitary pathologic right axillary lymph node with eccentric cortical thickening up to 7 mm. LEFT: 0.8 cm mass involving upper-outer left breast at 2 o'clock; no pathologic left axillary lymphadenopathy.  Accordingly on 12/03/2020 she proceeded to biopsy of the bilateral breast areas in question. The pathology from this procedure ([id number]) showed:  Right Breast, 9:30 fibroadenoma Right Breast, 10 o'clock Invasive and in situ mammary carcinoma, e-cadherin negative, grade 2 Prognostic indicators significant for: estrogen receptor, 95% positive and progesterone receptor, 100% positive, both with strong staining intensity. Proliferation marker Ki67 at 40%. HER2 negative by immunohistochemistry (0). 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 Grade or-2  Prognostic indicators significant for: estrogen receptor, 95% positive and progesterone receptor, 100% positive, both with strong staining intensity. Proliferation marker Ki67 at 15%. HER2 negative by immunohistochemistry (1+).  Cancer Staging Malignant neoplasm of upper-outer quadrant of left breast in female, estrogen receptor positive ([place]) Staging form: Breast, AJCC 8th Edition - Clinical stage from 12/17/2020: cT1b, cN0, cM0, ER+, PR+, HER2- - Signed by [name], MD on 12/17/2020 Stage prefix: Initial diagnosis Laterality: Left Staged by: Pathologist and managing physician Stage used in treatment planning: Yes National guidelines used in treatment planning: Yes Type of national guideline used in treatment planning: NCCN  Malignant neoplasm of upper-outer quadrant of right breast in female, estrogen receptor positive ([place]) Staging form: Breast, AJCC 8th Edition - Clinical stage from 12/17/2020: cT2, cN1, cM0, ER+, PR+, HER2- - Signed by [name], MD on 12/17/2020 Stage prefix: Initial diagnosis Laterality: Right Staged by: Pathologist and managing physician Stage used in treatment planning: Yes National guidelines used in treatment planning: Yes Type of national guideline used in treatment planning: NCCN  The patient's subsequent history is as detailed below.   PAST MEDICAL HISTORY: Past Medical History:  Diagnosis Date   Allergy    seasonal allergies   Anxiety    on meds   Asthma    uses inhaler   Breast cancer ([place]) 2012   RIGHT lumpectomy   Cancer ([place]) 2022   RIGHT breast lump-dx 2022   Depression    on meds   DVT (deep venous thrombosis) ([place]) 2010   after hysterectomy   Family history of uterine cancer    GERD (gastroesophageal reflux disease)    with certain foods/OTC PRN meds   Headache(784.0)    Hyperlipidemia    on meds   Hypertension    on meds    PAST SURGICAL HISTORY: Past Surgical History:  Procedure Laterality Date    ABDOMINAL HYSTERECTOMY     BREAST EXCISIONAL BIOPSY Right 09/16/2009   BREAST SURGERY     lumpectomy   PORTACATH PLACEMENT Left 01/26/2021   Procedure: INSERTION PORT-A-CATH;  Surgeon: [name], MD;  Location: [facility];  Service: General;  Laterality: Left;   TUBAL LIGATION    Patient's ovaries are still in place, status post hysterectomy 08/18/2010 with benign pathology   FAMILY HISTORY: Family History  Problem Relation Age of Onset   Cancer Mother        ?, dx <50   Hypertension Father    Cancer Maternal Aunt        uterine vs ovarian, dx <50   Cancer Maternal Aunt        unknown type, dx <50   Uterine cancer Cousin 57   Colon polyps Neg Hx    Colon cancer Neg Hx    Esophageal cancer Neg Hx    Stomach cancer Neg Hx    Her father is age 81 and her mother age 50 as of 11/2020. Her mother has a history of cancer (type unsure).  The patient's mother had two sisters also with cancer, one with uterine and the other cancer type is unknown to the patient. Chelsea Hansen has 1 brother and 1 sister.    GYNECOLOGIC HISTORY:  No LMP recorded. Patient has had a hysterectomy. Menarche: 50 years old Age at first live birth: 50 years old [place] P 5 LMP 2012, with hysterectomy Contraceptive never used HRT never used  Hysterectomy? Yes, 07/2010 for fibroids (pathology in Epic) BSO? no   SOCIAL HISTORY: (updated 11/2020)  Chelsea Hansen is  currently working as a Training and development officer for [employer]. She is single and lives at home with her three sons. Her two daughters also live here in [place]. Daughter Chelsea Hansen, age 80, works at [employer]. Daughter Chelsea Hansen, age 65, and son Chelsea Hansen, age 25, work at [employer]. Son Chelsea Hansen, age 30, and daughter Chelsea Hansen, age 5, are currently not employed. Antonietta is not a Designer, fashion/clothing.    ADVANCED DIRECTIVES: not in place   HEALTH MAINTENANCE: Social History   Tobacco Use   Smoking status: Never   Smokeless tobacco: Never  Vaping Use   Vaping Use:  Never used  Substance Use Topics   Alcohol use: Not Currently    Alcohol/week: 7.0 standard drinks    Types: 7 Standard drinks or equivalent per week    Comment: occassional   Drug use: No     Colonoscopy: 10/2020 (Dr. Fuller Plan), recall 2027  PAP: 05/2010, negative, repeat not indicated (s/p hysterectomy)  Bone density: n/a (age)   No Known Allergies  Current Outpatient Medications  Medication Sig Dispense Refill   acetaminophen (TYLENOL) 500 MG tablet Take 1 tablet by mouth 3 times daily as needed. Take with Aleve 220 mg 90 tablet 0   albuterol (VENTOLIN HFA) 108 (90 Base) MCG/ACT inhaler Inhale 2 puffs into the lungs every 6 (six) hours as needed for wheezing or shortness of breath. 18 g 2   amLODipine (NORVASC) 10 MG tablet Take 1 tablet (10 mg total) by mouth daily. 90 tablet 1   Cholecalciferol (VITAMIN D3) 2000 units capsule Take 2,000 Units by mouth daily.  11   dexamethasone (DECADRON) 4 MG tablet Take 2 tablets (8 mg total) by mouth daily. Take daily for 3 days after chemo. Take with food. 30 tablet 1   doxycycline (VIBRA-TABS) 100 MG tablet Take 1 tablet (100 mg total) by mouth 2 (two) times daily. 10 tablet 0   escitalopram (LEXAPRO) 20 MG tablet Take 1 tablet (20 mg total) by mouth daily. 90 tablet 1   fluconazole (DIFLUCAN) 100 MG tablet Take 1 tablet by mouth at bedtime. Take days 1 through 5 after each  chemotherapy dose 20 tablet 0   fluconazole (DIFLUCAN) 150 MG tablet Take 1 tablet (150 mg total) by mouth once for 1 dose. 1 tablet 0   fluticasone (FLONASE) 50 MCG/ACT nasal spray Use 2 sprays in each nostril daily. 16 g 11   hydrochlorothiazide (HYDRODIURIL) 25 MG tablet Take 1 tablet (25 mg total) by mouth daily. 90 tablet 1   lidocaine-prilocaine (EMLA) cream Apply to affected area once as directed 30 g 3   montelukast (SINGULAIR) 10 MG tablet Take 1 tablet (10 mg total) by mouth at bedtime. 90 tablet 3   Multiple Vitamin (MULTIVITAMIN) tablet Take 2 tablets by mouth  daily.     OLANZapine (ZYPREXA) 5 MG tablet Take 1 tablet (5 mg total) by mouth at bedtime. Take on days 2, 3, and 4 after chemotherapy. May take 2 tablets (10 mg total) by mouth at bedtime if 5 mg does not stop nausea/vomiting. 18 tablet 0   omeprazole (PRILOSEC) 40 MG capsule Take 1 capsule by mouth at bedtime. Take days 1 through 5 after each chemotherapy dose 60 capsule 0   ondansetron (ZOFRAN) 8 MG tablet Take 1 tablet by mouth every 8 hours as needed for nausea or vomiting. Start day 3 after chemotherapy. 20 tablet 0   prochlorperazine (COMPAZINE) 10 MG tablet Take one tablet before meals days 2 and 3 after each chemotherapy  cycle. 30 tablet 1   promethazine (PHENERGAN) 25 MG suppository Unwrap and insert 1 suppository (25 mg total) rectally every 8 (eight) hours as needed for up to 6 doses for nausea or vomiting. 6 each 0   No current facility-administered medications for this visit.   Facility-Administered Medications Ordered in Other Visits  Medication Dose Route Frequency Provider Last Rate Last Admin   acetaminophen (TYLENOL) 500 MG tablet            ondansetron (ZOFRAN) 4 MG/2ML injection            ondansetron (ZOFRAN) 4 MG/2ML injection            sodium chloride flush (NS) 0.9 % injection 10 mL  10 mL Intracatheter PRN Magrinat, Virgie Dad, MD   10 mL at 02/10/21 1716    OBJECTIVE:   There were no vitals filed for this visit. See vitals in [facility] for patient she was examined in the chemo room      There is no height or weight on file to calculate BMI.   Wt Readings from Last 3 Encounters:  02/25/21 216 lb 8 oz (98.2 kg)  02/20/21 216 lb 12.8 oz (98.3 kg)  02/10/21 216 lb 1.6 oz (98 kg)     ECOG FS:1 - Symptomatic but completely ambulatory GENERAL: Patient is a well appearing female in no acute distress HEENT:  Sclerae anicteric.  Mask in place4. Neck is supple.  NODES:  No cervical, supraclavicular, or axillary lymphadenopathy palpated.  BREAST EXAM:  Deferred. Unable to  examine in the chemo room LUNGS:  Clear to auscultation bilaterally.  No wheezes or rhonchi. HEART:  Regular rate and rhythm. No murmur appreciated. ABDOMEN:  Soft, nontender.  Positive, normoactive bowel sounds. No organomegaly palpated. MSK:  No focal spinal tenderness to palpation. Full range of motion bilaterally in the upper extremities. EXTREMITIES:  No peripheral edema.   SKIN:  Clear with no obvious rashes or skin changes. No nail dyscrasia. NEURO:  Nonfocal. Well oriented.  Appropriate affect.    LAB RESULTS:  CMP     Component Value Date/Time   NA 141 02/25/2021 0733   NA 139 10/06/2020 1432   K 3.4 (L) 02/25/2021 0733   CL 107 02/25/2021 0733   CO2 22 02/25/2021 0733   GLUCOSE 98 02/25/2021 0733   BUN 12 02/25/2021 0733   BUN 11 10/06/2020 1432   CREATININE 0.83 02/25/2021 0733   CREATININE 0.91 12/17/2020 0831   CALCIUM 9.0 02/25/2021 0733   PROT 6.6 02/25/2021 0733   PROT 6.9 10/06/2020 1432   ALBUMIN 3.6 02/25/2021 0733   ALBUMIN 4.1 10/06/2020 1432   AST 13 (L) 02/25/2021 0733   AST 15 12/17/2020 0831   ALT 15 02/25/2021 0733   ALT 18 12/17/2020 0831   ALKPHOS 135 (H) 02/25/2021 0733   BILITOT <0.2 (L) 02/25/2021 0733   BILITOT 0.3 12/17/2020 0831   GFRNONAA >60 02/25/2021 0733   GFRNONAA >60 12/17/2020 0831   GFRAA >60 09/07/2019 1340    Lab Results  Component Value Date   TOTALPROTELP 7.0 12/31/2011   ALBUMINELP 56.4 12/31/2011   A1GS 4.3 12/31/2011   A2GS 10.4 12/31/2011   BETS 6.4 12/31/2011   BETA2SER 4.6 12/31/2011   GAMS 17.9 12/31/2011   MSPIKE NOT DETECTED 12/31/2011   SPEI (NOTE) 12/31/2011    Lab Results  Component Value Date   WBC 10.0 02/25/2021   NEUTROABS 5.6 02/25/2021   HGB 8.1 (L) 02/25/2021   HCT 26.1 (  L) 02/25/2021   MCV 73.5 (L) 02/25/2021   PLT 348 02/25/2021    No results found for: LABCA2  No components found for: [id number]  No results for input(s): INR in the last 168 hours.  No results found for:  LABCA2  No results found for: HWT888  No results found for: CAN125  No results found for: KCM034  Lab Results  Component Value Date   CA2729 66.9 (H) 02/10/2021    No components found for: HGQUANT  No results found for: CEA1 / No results found for: CEA1   No results found for: AFPTUMOR  No results found for: [facility]  Lab Results  Component Value Date   KAPLAMBRATIO 10.08 12/31/2011   (kappa/lambda light chains)  No results found for: HGBA, HGBA2QUANT, HGBFQUANT, HGBSQUAN (Hemoglobinopathy evaluation)   Lab Results  Component Value Date   LDH 194 12/31/2011    Lab Results  Component Value Date   IRON 95 12/31/2011   TIBC 325 12/31/2011   IRONPCTSAT 29 12/31/2011   (Iron and TIBC)  Lab Results  Component Value Date   FERRITIN 37 12/31/2011    Urinalysis    Component Value Date/Time   COLORURINE YELLOW 09/07/2019 1341   APPEARANCEUR HAZY (A) 09/07/2019 1341   LABSPEC 1.021 09/07/2019 1341   PHURINE 9.0 (H) 09/07/2019 1341   GLUCOSEU NEGATIVE 09/07/2019 1341   HGBUR NEGATIVE 09/07/2019 1341   BILIRUBINUR NEGATIVE 09/07/2019 1341   KETONESUR NEGATIVE 09/07/2019 1341   PROTEINUR 30 (A) 09/07/2019 1341   UROBILINOGEN 0.2 11/01/2013 1246   NITRITE NEGATIVE 09/07/2019 1341   LEUKOCYTESUR NEGATIVE 09/07/2019 1341    STUDIES: DG Chest 2 View  Result Date: 01/28/2021 CLINICAL DATA:  Chest pain. Hematemesis. Question aspiration. Breast cancer. EXAM: CHEST - 2 VIEW COMPARISON:  01/26/2021 FINDINGS: Heart size is mildly enlarged. Mediastinal shadows are normal. Power port inserted from the left has its tip in the SVC above the right atrium. The lungs are clear. No infiltrate, collapse or effusion. No significant bone finding. IMPRESSION: No active cardiopulmonary disease. Electronically Signed   By: Nelson Chimes M.D.   On: 01/28/2021 09:56   CT HEAD W & WO CONTRAST (5MM)  Addendum Date: 02/03/2021   ADDENDUM REPORT: 02/03/2021 15:00 ADDENDUM: Priors  description of the study as being done without contrast were in error. On postcontrast imaging, no enhancing lesions are seen. No evidence of metastatic disease. Electronically Signed   By: Merilyn Baba M.D.   On: 02/03/2021 15:00   Result Date: 02/03/2021 CLINICAL DATA:  Breast cancer EXAM: CT HEAD WITHOUT AND WITH CONTRAST TECHNIQUE: Contiguous axial images were obtained from the base of the skull through the vertex without and with intravenous contrast CONTRAST:  29m OMNIPAQUE IOHEXOL 350 MG/ML SOLN COMPARISON:  12/31/2011. FINDINGS: Brain: No evidence of acute infarction, hemorrhage, hydrocephalus, extra-axial collection or mass lesion/mass effect. Please note that evaluation for metastatic disease is limited in the absence of intravenous contrast. Vascular: No hyperdense vessel or unexpected calcification. Visible vessels are patent. Skull: Negative for fracture or suspicious lesion. Redemonstrated small lucency in the right frontal calvarium, unchanged compared to 2013. Sinuses/Orbits: No acute finding. Other: None. IMPRESSION: No acute intracranial process. Please note that evaluation for parenchymal metastatic disease is limited in the absence of intravenous contrast. Electronically Signed: By: AMerilyn BabaM.D. On: 02/02/2021 13:34   CT Soft Tissue Neck W Contrast  Result Date: 02/02/2021 CLINICAL DATA:  Neck mass initial workup; metastatic breast cancer, throat mass seen on intubation EXAM: CT NECK  WITH CONTRAST TECHNIQUE: Multidetector CT imaging of the neck was performed using the standard protocol following the bolus administration of intravenous contrast. CONTRAST:  66m OMNIPAQUE IOHEXOL 350 MG/ML SOLN COMPARISON:  None FINDINGS: Pharynx and larynx: Unremarkable. There is no mass or swelling identified. Salivary glands: Parotid and submandibular glands are unremarkable. Thyroid: Unremarkable. Lymph nodes: No enlarged or abnormal density nodes. Vascular: Partially imaged left chest wall port  catheter. Major neck vessels are patent. Limited intracranial: Minimally included Visualized orbits: Unremarkable. Mastoids and visualized paranasal sinuses: Retention cyst or polyp of the right maxillary sinus. Skeleton: Lucent lesions of the included spine reflecting metastases. Upper chest: No apical lung mass. Other: Tooth decay with caries and periapical lucencies. IMPRESSION: No neck mass or adenopathy. Cervicothoracic spine metastases. Electronically Signed   By: PMacy MisM.D.   On: 02/02/2021 19:54     ELIGIBLE FOR AVAILABLE RESEARCH PROTOCOL: no  ASSESSMENT: 50y.o. Chelsea Hansen woman status post bilateral breast biopsies 12/03/2020, showing  (1) on the right, a clinical T2 N1, stage IIa invasive lobular carcinoma, grade 2, estrogen and progesterone receptor strongly positive, HER2 not amplified, with an MIB-1 of 40%   (a) the biopsied right axillary lymph node was positive with extracapsular extension   (b) a second right breast mass also biopsied was a fibroadenoma, concordant   (c) biopsy of an area of non-mass-like enhancement in the upper right breast pending   (2) on the left, a clinical T1b N0, stage IA invasive ductal carcinoma, grade 1 or 2, estrogen and progesterone receptor positive, HER2 not amplified, with an MIB 1 of 15%   (a) biopsy of a 0.4 cm enhancement in the medial left breast pending  (3) genetics testing through the [facility] BRCAplus panel (report date 12/24/2020) or the CancerNext-Expanded + RNAinsight panel (report date 12/31/2020) found no deleterious mutations in Chelsea Hansen, BRCA1, BRCA2, CDH1, CHEK2, PALB2, PTEN, and TP53. The CancerNext-Expanded + RNAinsight gene panel offered by [employer] includes sequencing and rearrangement analysis for the following 77 genes: Chelsea Hansen, Chelsea Hansen, Chelsea Hansen, Chelsea Hansen, AXIN2, BAP1, BARD1, BLM, BMPR1A, BRCA1, BRCA2, BRIP1, CDC73, CDH1, CDK4, CDKN1B, CDKN2A, CHEK2, CTNNA1, DICER1, FANCC, FH, FLCN, GALNT12, KIF1B, LZTR1, MAX, MEN1, MET, MLH1, MSH2, MSH3,  MSH6, MUTYH, NBN, NF1, NF2, NTHL1, PALB2, PHOX2B, PMS2, POT1, PRKAR1A, PTCH1, PTEN, RAD51C, RAD51D, RB1, RECQL, RET, SDHA, SDHAF2, SDHB, SDHC, SDHD, SMAD4, SMARCA4, SMARCB1, SMARCE1, STK11, SUFU, TMEM127, TP53, TSC1, TSC2, VHL and XRCC2 (sequencing and deletion/duplication); EGFR, EGLN1, HOXB13, KIT, MITF, PDGFRA, POLD1 and POLE (sequencing only); EPCAM and GREM1 (deletion/duplication only). RNA data is routinely analyzed for use in variant interpretation for all genes.  (a) A variant of uncertain significance (VUS) was detected in the Chelsea Hansen gene called p.D44G (c.131A>G).   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  (5) definitive surgery to follow chemotherapy  (6) chemotherapy consisting of doxorubicin and cyclophosphamide in dose dense fashion x4 started 01/27/2021 to be followed by weekly paclitaxel x12.  (7) adjuvant radiation on the right, possibly also on the left  (8) antiestrogens to follow at the completion of local treatment  (9) oral mass noted at time of intubation for port placement:  (A) CT head and neck 02/02/2021 negative  (B) ENT evaluation Dr. SKandis Cocking08/19/2022 benign note from her February 06 2021 visit notes mild mucositis, thrush, and reflux.  When we see RVeidaagain we will start her on a PPI and Diflucan for thrush   PLAN: RSuanne Marker  is here for f/u and treatment of her right sided breast cancer.  She will proceed with chemotherapy today with Doxocuribicn and Cyclophsophamide.  I recommended that she continue with taking Tylenol and Aleve for her pain as recommended by Dr. Jana Hakim.    Parminder has not started the diflucan or the omeprazole that Dr. Jana Hakim prescribed at her last visit.  I have asked her to get these filled.    She will receive IV fluids tomorrow and Friday to prevent dehydration like she experienced with her first cycle.    Evonna will return in 2  weeks fo4r labs, f/u and her fourth cycle of chemotherapy.  She knows to call for any questions that may arise between now and her next appointment.  We are happy to see her sooner if needed  Total encounter time: 20 minutes in face to face visit time and documenting the encounter  Wilber Bihari, NP 02/26/21 4:06 PM Medical Oncology and Hematology [facility] [place], [place] [address] Tel. [phone]    Fax. [phone]    *Total Encounter Time as defined by the Centers for Medicare and Medicaid Services includes, in addition to the face-to-face time of a patient visit (documented in the note above) non-face-to-face time: obtaining and reviewing outside history, ordering and reviewing medications, tests or procedures, care coordination (communications with other health care professionals or caregivers) and documentation in the medical record.

## 2021-02-25 NOTE — Progress Notes (Signed)
[facility] CSW Progress Note  Clinical Education officer, museum and SW intern met with patient in infusion. 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  She is working with Programmer, applications on [employer] and paperwork. CSW also gave information on Triage Cancer for further support.  Provided bag from food pantry and gift card. Encouraged pt to contact insurance ([facility]) to find out if she qualifies for their community support.    Christeen Douglas , LCSW

## 2021-02-26 ENCOUNTER — Ambulatory Visit (INDEPENDENT_AMBULATORY_CARE_PROVIDER_SITE_OTHER): Payer: Medicaid Other | Admitting: Primary Care

## 2021-02-26 ENCOUNTER — Inpatient Hospital Stay: Payer: Medicaid Other

## 2021-02-26 ENCOUNTER — Encounter: Payer: Self-pay | Admitting: Oncology

## 2021-02-26 ENCOUNTER — Other Ambulatory Visit (HOSPITAL_COMMUNITY): Payer: Self-pay

## 2021-02-26 VITALS — BP 131/82 | HR 60 | Temp 98.2°F | Resp 20

## 2021-02-26 DIAGNOSIS — Z95828 Presence of other vascular implants and grafts: Secondary | ICD-10-CM

## 2021-02-26 DIAGNOSIS — Z170 Estrogen receptor positive status [ER+]: Secondary | ICD-10-CM

## 2021-02-26 DIAGNOSIS — Z5111 Encounter for antineoplastic chemotherapy: Secondary | ICD-10-CM | POA: Diagnosis not present

## 2021-02-26 MED ORDER — SODIUM CHLORIDE 0.9% FLUSH
10.0000 mL | Freq: Once | INTRAVENOUS | Status: AC
  Administered 2021-02-26: 10 mL

## 2021-02-26 MED ORDER — ONDANSETRON HCL 4 MG/2ML IJ SOLN
INTRAMUSCULAR | Status: AC
  Filled 2021-02-26: qty 4

## 2021-02-26 MED ORDER — SODIUM CHLORIDE 0.9 % IV SOLN: INTRAVENOUS | Status: DC

## 2021-02-26 MED ORDER — HEPARIN SOD (PORK) LOCK FLUSH 100 UNIT/ML IV SOLN
500.0000 [IU] | Freq: Once | INTRAVENOUS | Status: AC
  Administered 2021-02-26: 500 [IU]

## 2021-02-26 MED ORDER — SODIUM CHLORIDE 0.9 % IV SOLN: 8.0000 mg | Freq: Once | INTRAVENOUS | Status: DC

## 2021-02-26 MED ORDER — ONDANSETRON HCL 4 MG/2ML IJ SOLN
8.0000 mg | Freq: Once | INTRAMUSCULAR | Status: AC
  Administered 2021-02-26: 8 mg via INTRAVENOUS

## 2021-02-26 NOTE — Patient Instructions (Addendum)
Rehydration, Adult Rehydration is the replacement of body fluids, salts, and minerals (electrolytes) that are lost during dehydration. Dehydration is when there is not enough water or other fluids in the body. This happens when you lose more fluids than you take in. Common causes of dehydration include: Not drinking enough fluids. This can occur when you are ill or doing activities that require a lot of energy, especially in hot weather. Conditions that cause loss of water or other fluids, such as diarrhea, vomiting, sweating, or urinating a lot. Other illnesses, such as fever or infection. Certain medicines, such as those that remove excess fluid from the body (diuretics). Symptoms of mild or moderate dehydration may include thirst, dry lips and mouth, and dizziness. Symptoms of severe dehydration may include increased heart rate, confusion, fainting, and not urinating. For severe dehydration, you may need to get fluids through an IV at the hospital. For mild or moderate dehydration, you can usually rehydrate at home by drinking certain fluids as told by your health care provider. What are the risks? Generally, rehydration is safe. However, taking in too much fluid (overhydration) can be a problem. This is rare. Overhydration can cause an electrolyte imbalance, kidney failure, or a decrease in salt (sodium) levels in the body. Supplies needed You will need an oral rehydration solution (ORS) if your health care provider tells you to use one. This is a drink to treat dehydration. It can be found in pharmacies and retail stores. How to rehydrate Fluids Follow instructions from your health care provider for rehydration. The kind of fluid and the amount you should drink depend on your condition. In general, you should choose drinks that you prefer. If told by your health care provider, drink an ORS. Make an ORS by following instructions on the package. Start by drinking small amounts, about  cup (120  mL) every 5-10 minutes. Slowly increase how much you drink until you have taken the amount recommended by your health care provider. Drink enough clear fluids to keep your urine pale yellow. If you were told to drink an ORS, finish it first, then start slowly drinking other clear fluids. Drink fluids such as: Water. This includes sparkling water and flavored water. Drinking only water can lead to having too little sodium in your body (hyponatremia). Follow the advice of your health care provider. Water from ice chips you suck on. Fruit juice with water you add to it (diluted). Sports drinks. Hot or cold herbal teas. Broth-based soups. Milk or milk products. Food Follow instructions from your health care provider about what to eat while you rehydrate. Your health care provider may recommend that you slowly begin eating regular foods in small amounts. Eat foods that contain a healthy balance of electrolytes, such as bananas, oranges, potatoes, tomatoes, and spinach. Avoid foods that are greasy or contain a lot of sugar. In some cases, you may get nutrition through a feeding tube that is passed through your nose and into your stomach (nasogastric tube, or NG tube). This may be done if you have uncontrolled vomiting or diarrhea. Beverages to avoid Certain beverages may make dehydration worse. While you rehydrate, avoid drinking alcohol. How to tell if you are recovering from dehydration You may be recovering from dehydration if: You are urinating more often than before you started rehydrating. Your urine is pale yellow. Your energy level improves. You vomit less frequently. You have diarrhea less frequently. Your appetite improves or returns to normal. You feel less dizzy or less light-headed. Your  skin tone and color start to look more normal. Follow these instructions at home: Take over-the-counter and prescription medicines only as told by your health care provider. Do not take sodium  tablets. Doing this can lead to having too much sodium in your body (hypernatremia). Contact a health care provider if: You continue to have symptoms of mild or moderate dehydration, such as: Thirst. Dry lips. Slightly dry mouth. Dizziness. Dark urine or less urine than normal. Muscle cramps. You continue to vomit or have diarrhea. Get help right away if you: Have symptoms of dehydration that get worse. Have a fever. Have a severe headache. Have been vomiting and the following happens: Your vomiting gets worse or does not go away. Your vomit includes blood or green matter (bile). You cannot eat or drink without vomiting. Have problems with urination or bowel movements, such as: Diarrhea that gets worse or does not go away. Blood in your stool (feces). This may cause stool to look black and tarry. Not urinating, or urinating only a small amount of very dark urine, within 6-8 hours. Have trouble breathing. Have symptoms that get worse with treatment. These symptoms may represent a serious problem that is an emergency. Do not wait to see if the symptoms will go away. Get medical help right away. Call your local emergency services (911 in the [place] Do not drive yourself to the hospital. Summary Rehydration is the replacement of body fluids and minerals (electrolytes) that are lost during dehydration. Follow instructions from your health care provider for rehydration. The kind of fluid and amount you should drink depend on your condition. Slowly increase how much you drink until you have taken the amount recommended by your health care provider. Contact your health care provider if you continue to show signs of mild or moderate dehydration. This information is not intended to replace advice given to you by your health care provider. Make sure you discuss any questions you have with your health care provider.  Ondansetron Injection What is this medication? ONDANSETRON (on DAN se tron)  prevents nausea and vomiting from chemotherapy, radiation, or surgery. It works by blocking substances in the body that may cause nausea or vomiting. It belongs to a groupd of medications called antiemetics. This medicine may be used for other purposes; ask your health care provider or pharmacist if you have questions. COMMON BRAND NAME(S): Zofran What should I tell my care team before I take this medication? They need to know if you have any of these conditions: Heart disease History of irregular heartbeat Liver disease Low levels of magnesium or potassium in the blood An unusual or allergic reaction to ondansetron, granisetron, other medications, foods, dyes, or preservatives Pregnant or trying to get pregnant Breast-feeding How should I use this medication? This medication is for infusion into a vein. It is given in a hospital or clinic. Talk to your care team regarding the use of this medication in children. Special care may be needed. Overdosage: If you think you have taken too much of this medicine contact a poison control center or emergency room at once. NOTE: This medicine is only for you. Do not share this medicine with others. What if I miss a dose? This does not apply. What may interact with this medication? Do not take this medication with any of the following: Apomorphine Certain medications for fungal infections like fluconazole, itraconazole, ketoconazole, posaconazole, voriconazole Cisapride Dronedarone Pimozide Thioridazine This medication may also interact with the following: Carbamazepine Certain medications for depression, anxiety,  or psychotic disturbances Fentanyl Linezolid MAOIs like Carbex, Eldepryl, Marplan, Nardil, and Parnate Methylene blue (injected into a vein) Other medications that prolong the QT interval (cause an abnormal heart rhythm) like dofetilide, ziprasidone Phenytoin Rifampicin Tramadol This list may not describe all possible  interactions. Give your health care provider a list of all the medicines, herbs, non-prescription drugs, or dietary supplements you use. Also tell them if you smoke, drink alcohol, or use illegal drugs. Some items may interact with your medicine. What should I watch for while using this medication? Your condition will be monitored carefully while you are receiving this medication. What side effects may I notice from receiving this medication? Side effects that you should report to your care team as soon as possible: Allergic reactions-skin rash, itching, hives, swelling of the face, lips, tongue, or throat Bowel blockage-stomach cramping, unable to have a bowel movement or pass gas, loss of appetite, vomiting Chest pain (angina)-pain, pressure, or tightness in the chest, neck, back, or arms Heart rhythm changes-fast or irregular heartbeat, dizziness, feeling faint or lightheaded, chest pain, trouble breathing Irritability, confusion, fast or irregular heartbeat, muscle stiffness, twitching muscles, sweating, high fever, seizure, chills, vomiting, diarrhea, which may be signs of serotonin syndrome Side effects that usually do not require medical attention (report to your care team if they continue or are bothersome): Constipation Diarrhea General discomfort and fatigue Headache This list may not describe all possible side effects. Call your doctor for medical advice about side effects. You may report side effects to FDA at 1-800-FDA-1088. Where should I keep my medication? This medication is given in a hospital or clinic and will not be stored at home. NOTE: This sheet is a summary. It may not cover all possible information. If you have questions about this medicine, talk to your doctor, pharmacist, or health care provider.  2022 Elsevier/[employer] Standard (2020-06-27 14:19:15)Nausea, Adult Nausea is feeling sick to your stomach or feeling that you are about to throw up (vomit). Feeling sick to your  stomach is usually not serious, but it may be an early sign of a more serious medical problem. As you feel sicker to your stomach, you may throw up. If you throw up, or if you are not able to drink enough fluids, there is a risk that you may lose too much water in your body (get dehydrated). If you lose too much water in your body, you may: Feel tired. Feel thirsty. Have a dry mouth. Have cracked lips. Go pee (urinate) less often. Older adults and people who have other diseases or a weak body defense system (immune system) have a higher risk of losing too much water in the body. The main goals of treating this condition are: To relieve your nausea. To ensure your nausea occurs less often. To prevent throwing up and losing too much fluid. Follow these instructions at home: Watch your symptoms for any changes. Tell your doctor about them. Follow these instructions as told by your doctor. Eating and drinking   Take an ORS (oral rehydration solution). This is a drink that is sold at pharmacies and stores. Drink clear fluids in small amounts as you are able. These include: Water. Ice chips. Fruit juice that has water added (diluted fruit juice). Low-calorie sports drinks. Eat bland, easy-to-digest foods in small amounts as you are able, such as: Bananas. Applesauce. Rice. Low-fat (lean) meats. Toast. Crackers. Avoid drinking fluids that have a lot of sugar or caffeine in them. This includes energy drinks, sports drinks, and  soda. Avoid alcohol. Avoid spicy or fatty foods. General instructions Take over-the-counter and prescription medicines only as told by your doctor. Rest at home while you get better. Drink enough fluid to keep your pee (urine) pale yellow. Take slow and deep breaths when you feel sick to your stomach. Avoid food or things that have strong smells. Wash your hands often with soap and water. If you cannot use soap and water, use hand sanitizer. Make sure that all  people in your home wash their hands well and often. Keep all follow-up visits as told by your doctor. This is important. Contact a doctor if: You feel sicker to your stomach. You feel sick to your stomach for more than 2 days. You throw up. You are not able to drink fluids without throwing up. You have new symptoms. You have a fever. You have a headache. You have muscle cramps. You have a rash. You have pain while peeing. You feel light-headed or dizzy. Get help right away if: You have pain in your chest, neck, arm, or jaw. You feel very weak or you pass out (faint). You have throw up that is bright red or looks like coffee grounds. You have bloody or black poop (stools) or poop that looks like tar. You have a very bad headache, a stiff neck, or both. You have very bad pain, cramping, or bloating in your belly (abdomen). You have trouble breathing or you are breathing very quickly. Your heart is beating very quickly. Your skin feels cold and clammy. You feel confused. You have signs of losing too much water in your body, such as: Dark pee, very little pee, or no pee. Cracked lips. Dry mouth. Sunken eyes. Sleepiness. Weakness. These symptoms may be an emergency. Do not wait to see if the symptoms will go away. Get medical help right away. Call your local emergency services (911 in the [place] Do not drive yourself to the hospital. Summary Nausea is feeling sick to your stomach or feeling that you are about to throw up (vomit). If you throw up, or if you are not able to drink enough fluids, there is a risk that you may lose too much water in your body (get dehydrated). Eat and drink what your doctor tells you. Take over-the-counter and prescription medicines only as told by your doctor. Contact a doctor right away if your symptoms get worse or you have new symptoms. Keep all follow-up visits as told by your doctor. This is important. This information is not intended to replace  advice given to you by your health care provider. Make sure you discuss any questions you have with your health care provider. Document Revised: 08/27/2020 Document Reviewed: 11/15/2017 Elsevier Patient Education  2022 [place] Revised: 08/08/2019 Document Reviewed: 06/18/2019 Elsevier Patient Education  2022 [employer].

## 2021-02-27 ENCOUNTER — Telehealth: Payer: Self-pay | Admitting: Clinical

## 2021-02-28 ENCOUNTER — Inpatient Hospital Stay: Payer: Medicaid Other

## 2021-02-28 ENCOUNTER — Other Ambulatory Visit: Payer: Self-pay

## 2021-02-28 VITALS — BP 152/85 | HR 65 | Temp 97.7°F | Resp 18

## 2021-02-28 DIAGNOSIS — Z5111 Encounter for antineoplastic chemotherapy: Secondary | ICD-10-CM | POA: Diagnosis not present

## 2021-02-28 MED ORDER — SODIUM CHLORIDE 0.9 % IV SOLN: INTRAVENOUS | Status: DC

## 2021-02-28 MED ORDER — PEGFILGRASTIM-CBQV 6 MG/0.6ML ~~LOC~~ SOSY
6.0000 mg | PREFILLED_SYRINGE | Freq: Once | SUBCUTANEOUS | Status: AC
  Administered 2021-02-28: 6 mg via SUBCUTANEOUS

## 2021-02-28 MED ORDER — HEPARIN SOD (PORK) LOCK FLUSH 100 UNIT/ML IV SOLN
500.0000 [IU] | Freq: Once | INTRAVENOUS | Status: AC
  Administered 2021-02-28: 500 [IU]

## 2021-02-28 MED ORDER — SODIUM CHLORIDE 0.9% FLUSH
10.0000 mL | Freq: Once | INTRAVENOUS | Status: AC
Start: 2021-02-28 — End: 2021-02-28
  Administered 2021-02-28: 10 mL

## 2021-03-02 ENCOUNTER — Other Ambulatory Visit (HOSPITAL_COMMUNITY): Payer: Self-pay

## 2021-03-02 ENCOUNTER — Other Ambulatory Visit: Payer: Self-pay | Admitting: *Deleted

## 2021-03-02 ENCOUNTER — Other Ambulatory Visit: Payer: Self-pay | Admitting: Oncology

## 2021-03-02 ENCOUNTER — Inpatient Hospital Stay: Admission: RE | Admit: 2021-03-02 | Payer: Medicaid Other | Source: Ambulatory Visit

## 2021-03-02 ENCOUNTER — Other Ambulatory Visit: Payer: Self-pay

## 2021-03-02 ENCOUNTER — Other Ambulatory Visit: Payer: Medicaid Other

## 2021-03-02 ENCOUNTER — Inpatient Hospital Stay: Payer: Medicaid Other

## 2021-03-02 VITALS — BP 128/79 | HR 68 | Temp 98.8°F | Resp 18

## 2021-03-02 DIAGNOSIS — C50411 Malignant neoplasm of upper-outer quadrant of right female breast: Secondary | ICD-10-CM

## 2021-03-02 DIAGNOSIS — R110 Nausea: Secondary | ICD-10-CM

## 2021-03-02 DIAGNOSIS — R55 Syncope and collapse: Secondary | ICD-10-CM

## 2021-03-02 DIAGNOSIS — Z5111 Encounter for antineoplastic chemotherapy: Secondary | ICD-10-CM | POA: Diagnosis not present

## 2021-03-02 DIAGNOSIS — Z95828 Presence of other vascular implants and grafts: Secondary | ICD-10-CM

## 2021-03-02 LAB — CBC WITH DIFFERENTIAL (CANCER CENTER ONLY)
Abs Immature Granulocytes: 0 10*3/uL (ref 0.00–0.07)
Band Neutrophils: 4 %
Basophils Absolute: 0 10*3/uL (ref 0.0–0.1)
Basophils Relative: 0 %
Eosinophils Absolute: 0 10*3/uL (ref 0.0–0.5)
Eosinophils Relative: 0 %
HCT: 25.5 % — ABNORMAL LOW (ref 36.0–46.0)
Hemoglobin: 8.3 g/dL — ABNORMAL LOW (ref 12.0–15.0)
Lymphocytes Relative: 6 %
Lymphs Abs: 1.4 10*3/uL (ref 0.7–4.0)
MCH: 23.8 pg — ABNORMAL LOW (ref 26.0–34.0)
MCHC: 32.5 g/dL (ref 30.0–36.0)
MCV: 73.1 fL — ABNORMAL LOW (ref 80.0–100.0)
Monocytes Absolute: 0 10*3/uL — ABNORMAL LOW (ref 0.1–1.0)
Monocytes Relative: 0 %
Neutro Abs: 22 10*3/uL — ABNORMAL HIGH (ref 1.7–7.7)
Neutrophils Relative %: 90 %
Platelet Count: 268 10*3/uL (ref 150–400)
RBC: 3.49 MIL/uL — ABNORMAL LOW (ref 3.87–5.11)
RDW: 18.7 % — ABNORMAL HIGH (ref 11.5–15.5)
WBC Count: 23.4 10*3/uL — ABNORMAL HIGH (ref 4.0–10.5)
nRBC: 0 % (ref 0.0–0.2)

## 2021-03-02 LAB — CMP (CANCER CENTER ONLY)
ALT: 14 U/L (ref 0–44)
AST: 14 U/L — ABNORMAL LOW (ref 15–41)
Albumin: 3.5 g/dL (ref 3.5–5.0)
Alkaline Phosphatase: 149 U/L — ABNORMAL HIGH (ref 38–126)
Anion gap: 8 (ref 5–15)
BUN: 11 mg/dL (ref 6–20)
CO2: 27 mmol/L (ref 22–32)
Calcium: 9.2 mg/dL (ref 8.9–10.3)
Chloride: 104 mmol/L (ref 98–111)
Creatinine: 0.7 mg/dL (ref 0.44–1.00)
GFR, Estimated: >60 mL/min (ref >60)
Glucose, Bld: 100 mg/dL — ABNORMAL HIGH (ref 70–99)
Potassium: 3.6 mmol/L (ref 3.5–5.1)
Sodium: 139 mmol/L (ref 135–145)
Total Bilirubin: 0.3 mg/dL (ref 0.3–1.2)
Total Protein: 6.6 g/dL (ref 6.5–8.1)

## 2021-03-02 MED ORDER — HEPARIN SOD (PORK) LOCK FLUSH 100 UNIT/ML IV SOLN
500.0000 [IU] | Freq: Once | INTRAVENOUS | Status: AC
  Administered 2021-03-02: 500 [IU]

## 2021-03-02 MED ORDER — SODIUM CHLORIDE 0.9 % IV SOLN: INTRAVENOUS | Status: DC

## 2021-03-02 MED ORDER — SODIUM CHLORIDE 0.9% FLUSH
10.0000 mL | Freq: Once | INTRAVENOUS | Status: AC
  Administered 2021-03-02: 10 mL

## 2021-03-02 MED ORDER — SODIUM CHLORIDE 0.9 % IV SOLN: INTRAVENOUS | Status: AC

## 2021-03-02 MED ORDER — SODIUM CHLORIDE 0.9 % IV SOLN: 8.0000 mg | Freq: Once | INTRAVENOUS | Status: DC

## 2021-03-02 MED ORDER — ONDANSETRON HCL 4 MG/2ML IJ SOLN
8.0000 mg | Freq: Once | INTRAMUSCULAR | Status: AC
  Administered 2021-03-02: 8 mg via INTRAVENOUS
  Filled 2021-03-02: qty 4

## 2021-03-02 NOTE — Patient Instructions (Signed)

## 2021-03-02 NOTE — Progress Notes (Signed)
I called Chelsea Hansen and checked in with her.  She says she was doing "terrible" Friday (02/27/2021).  She vomited a couple of times.  She was constipated.  She called today and we brought her in for right intravenous fluids and she says that helped.  She was home having supper as I called.  She thinks that if she had fluids again tomorrow that might be helpful but she was told there was no room.  I will check tomorrow to see if we have been any cancellations and we can work.

## 2021-03-03 ENCOUNTER — Other Ambulatory Visit: Payer: No Typology Code available for payment source

## 2021-03-03 ENCOUNTER — Ambulatory Visit: Payer: No Typology Code available for payment source | Admitting: Oncology

## 2021-03-03 ENCOUNTER — Telehealth: Payer: Self-pay | Admitting: *Deleted

## 2021-03-03 ENCOUNTER — Encounter: Payer: Self-pay | Admitting: *Deleted

## 2021-03-03 NOTE — Telephone Encounter (Signed)
This RN spoke with pt today per possible need for IVFs and current unavailibility in this office ( pt was offered yesterday to be set up at other cancer center - but she declined ).  Chelsea Hansen states she is doing better- able to drink well and she is taking her antinausea medications.  She understands to call if she feels she is not able to maintain good hydration.  Of note - she states she has not been scheduled for the IVFs with next week's chemo.  This RN verified per schedule as correct and sent an Urgent request to schedule IV fluids for 3 days post treatment next week.

## 2021-03-04 ENCOUNTER — Encounter: Payer: Self-pay | Admitting: Oncology

## 2021-03-09 MED FILL — Dexamethasone Sodium Phosphate Inj 100 MG/10ML: INTRAMUSCULAR | Qty: 1 | Status: AC

## 2021-03-09 MED FILL — Fosaprepitant Dimeglumine For IV Infusion 150 MG (Base Eq): INTRAVENOUS | Qty: 5 | Status: AC

## 2021-03-09 NOTE — Progress Notes (Signed)
[facility]  Telephone:([phone]) [phone] Fax:(336) [phone]     ID: Chelsea Hansen DOB: 05/31/1971  MR#: [id number]  YHC#:[id number]  Patient Care Team: [name], NP as PCP - General (Internal Medicine) [name], RN as Oncology Nurse Navigator [name], RN as Oncology Nurse Navigator [name], MD as Consulting Physician (Radiation Oncology) Eden Toohey, Virgie Dad, MD as Consulting Physician (Oncology) [name], MD as Consulting Physician (General Surgery) [name], MD OTHER MD:  CHIEF COMPLAINT: Bilateral estrogen receptor positive breast cancer  CURRENT TREATMENT:  neoadjuvant chemotherapy   INTERVAL HISTORY: Chelsea Hansen returns today for follow up and treatment of her bilateral breast cancer.   She started on neoadjuvant chemotherapy, consisting of doxorubicin and cyclophosphamide in dose dense fashion x4, first dose 01/27/2021.  Today is day 1 of cycle 4 of treatment, her final cycle.   By now she is very familiar with the treatments and knows how to manage side effects.  She is doing "good".  She is able to work 2 days a week, 5 hours a day at her old job.  She is looking forward to going back full-time once she starts the Taxol treatments.  REVIEW OF SYSTEMS: Chelsea Hansen has had no nausea or vomiting no mouth sores, no diarrhea or constipation.  She knows to hydrate herself well for the next 2 days and she also receives fluids for the next 3 days.  She is planning a trip to [place] to visit her mother next week.  A detailed review of systems today was otherwise stable   COVID 19 VACCINATION STATUS: [place] x2, most recently 10/2020, no booster as of June 2022   HISTORY OF CURRENT ILLNESS: From the original intake note:  Chelsea Hansen has a history of excisional biopsy of a fibroadenoma from the lower outer right breast in 2011.  More recently she noted a knot in her right breast.  She brought it to medical attention and proceeded  to bilateral diagnostic mammography with tomography and bilateral breast ultrasonography at [facility] on 11/18/2020 showing: breast density category B;   RIGHT: 3.2 cm mass associated with architectural distortion and scattered calcifications in upper-outer right breast at 10 o'clock, corresponding to palpable concern; indeterminate 1.1 cm mass in upper-outer right breast at 9:30; solitary pathologic right axillary lymph node with eccentric cortical thickening up to 7 mm. LEFT: 0.8 cm mass involving upper-outer left breast at 2 o'clock; no pathologic left axillary lymphadenopathy.  Accordingly on 12/03/2020 she proceeded to biopsy of the bilateral breast areas in question. The pathology from this procedure ([id number]) showed:  Right Breast, 9:30 fibroadenoma Right Breast, 10 o'clock Invasive and in situ mammary carcinoma, e-cadherin negative, grade 2 Prognostic indicators significant for: estrogen receptor, 95% positive and progesterone receptor, 100% positive, both with strong staining intensity. Proliferation marker Ki67 at 40%. HER2 negative by immunohistochemistry (0). 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 Grade or-2  Prognostic indicators significant for: estrogen receptor, 95% positive and progesterone receptor, 100% positive, both with strong staining intensity. Proliferation marker Ki67 at 15%. HER2 negative by immunohistochemistry (1+).  Cancer Staging Malignant neoplasm of upper-outer quadrant of left breast in female, estrogen receptor positive ([place]) Staging form: Breast, AJCC 8th Edition - Clinical stage from 12/17/2020: cT1b, cN0, cM0, ER+, PR+, HER2- - Signed by [name], MD on 12/17/2020  Stage prefix: Initial diagnosis Laterality: Left Staged by: Pathologist and managing physician Stage used  in treatment planning: Yes National guidelines used in treatment planning: Yes Type of national guideline used in treatment planning: NCCN  Malignant neoplasm of upper-outer quadrant of right breast in female, estrogen receptor positive ([place]) Staging form: Breast, AJCC 8th Edition - Clinical stage from 12/17/2020: cT2, cN1, cM0, ER+, PR+, HER2- - Signed by [name], MD on 12/17/2020 Stage prefix: Initial diagnosis Laterality: Right Staged by: Pathologist and managing physician Stage used in treatment planning: Yes National guidelines used in treatment planning: Yes Type of national guideline used in treatment planning: NCCN  The patient's subsequent history is as detailed below.   PAST MEDICAL HISTORY: Past Medical History:  Diagnosis Date   Allergy    seasonal allergies   Anxiety    on meds   Asthma    uses inhaler   Breast cancer ([place]) 2012   RIGHT lumpectomy   Cancer ([place]) 2022   RIGHT breast lump-dx 2022   Depression    on meds   DVT (deep venous thrombosis) ([place]) 2010   after hysterectomy   Family history of uterine cancer    GERD (gastroesophageal reflux disease)    with certain foods/OTC PRN meds   Headache(784.0)    Hyperlipidemia    on meds   Hypertension    on meds    PAST SURGICAL HISTORY: Past Surgical History:  Procedure Laterality Date   ABDOMINAL HYSTERECTOMY     BREAST EXCISIONAL BIOPSY Right 09/16/2009   BREAST SURGERY     lumpectomy   PORTACATH PLACEMENT Left 01/26/2021   Procedure: INSERTION PORT-A-CATH;  Surgeon: [name], MD;  Location: [facility];  Service: General;  Laterality: Left;   TUBAL LIGATION    Patient's ovaries are still in place, status post hysterectomy 08/18/2010 with benign pathology   FAMILY HISTORY: Family History  Problem Relation Age of Onset   Cancer Mother        ?, dx <50   Hypertension Father    Cancer Maternal Aunt        uterine vs ovarian, dx <50   Cancer Maternal Aunt        unknown type, dx  <50   Uterine cancer Cousin 34   Colon polyps Neg Hx    Colon cancer Neg Hx    Esophageal cancer Neg Hx    Stomach cancer Neg Hx    Her father is age 85 and her mother age 46 as of 11/2020. Her mother has a history of cancer (type unsure).  The patient's mother had two sisters also with cancer, one with uterine and the other cancer type is unknown to the patient. Chelsea Hansen has 1 brother and 1 sister.    GYNECOLOGIC HISTORY:  No LMP recorded. Patient has had a hysterectomy. Menarche: 50 years old Age at first live birth: 50 years old [place] P 5 LMP 2012, with hysterectomy Contraceptive never used HRT never used  Hysterectomy? Yes, 07/2010 for fibroids (pathology in Epic) BSO? no   SOCIAL HISTORY: (updated 11/2020)  Chelsea Hansen is currently working as a Training and development officer for [employer]. She is single and lives at home with her three sons. Her two daughters also live here in [place]. Daughter Sayuri Rhames, age 66, works at [employer]. Daughter Jilda Panda Sample, age 71, and son Isiah Nils Pyle, age 33, work at [employer]. Son Sharlee Blew, age 22, and daughter Alvera Novel, age 52, are currently not employed. Dawnielle is not a Designer, fashion/clothing.  ADVANCED DIRECTIVES: not in place   HEALTH MAINTENANCE: Social History   Tobacco Use   Smoking status: Never   Smokeless tobacco: Never  Vaping Use   Vaping Use: Never used  Substance Use Topics   Alcohol use: Not Currently    Alcohol/week: 7.0 standard drinks    Types: 7 Standard drinks or equivalent per week    Comment: occassional   Drug use: No     Colonoscopy: 10/2020 (Dr. Fuller Plan), recall 2027  PAP: 05/2010, negative, repeat not indicated (s/p hysterectomy)  Bone density: n/a (age)   No Known Allergies  Current Outpatient Medications  Medication Sig Dispense Refill   acetaminophen (TYLENOL) 500 MG tablet Take 1 tablet by mouth 3 times daily as needed. Take with Aleve 220 mg 90 tablet 0   albuterol (VENTOLIN HFA) 108 (90 Base) MCG/ACT inhaler Inhale 2 puffs  into the lungs every 6 (six) hours as needed for wheezing or shortness of breath. 18 g 2   amLODipine (NORVASC) 10 MG tablet Take 1 tablet (10 mg total) by mouth daily. 90 tablet 1   Cholecalciferol (VITAMIN D3) 2000 units capsule Take 2,000 Units by mouth daily.  11   dexamethasone (DECADRON) 4 MG tablet Take 2 tablets (8 mg total) by mouth daily. Take daily for 3 days after chemo. Take with food. 30 tablet 1   doxycycline (VIBRA-TABS) 100 MG tablet Take 1 tablet (100 mg total) by mouth 2 (two) times daily. 10 tablet 0   escitalopram (LEXAPRO) 20 MG tablet Take 1 tablet (20 mg total) by mouth daily. 90 tablet 1   fluconazole (DIFLUCAN) 100 MG tablet Take 1 tablet by mouth at bedtime. Take days 1 through 5 after each  chemotherapy dose 20 tablet 0   fluticasone (FLONASE) 50 MCG/ACT nasal spray Use 2 sprays in each nostril daily. 16 g 11   hydrochlorothiazide (HYDRODIURIL) 25 MG tablet Take 1 tablet (25 mg total) by mouth daily. 90 tablet 1   lidocaine-prilocaine (EMLA) cream Apply to affected area once as directed 30 g 3   montelukast (SINGULAIR) 10 MG tablet Take 1 tablet (10 mg total) by mouth at bedtime. 90 tablet 3   Multiple Vitamin (MULTIVITAMIN) tablet Take 2 tablets by mouth daily.     OLANZapine (ZYPREXA) 5 MG tablet Take 1 tablet (5 mg total) by mouth at bedtime. Take on days 2, 3, and 4 after chemotherapy. May take 2 tablets (10 mg total) by mouth at bedtime if 5 mg does not stop nausea/vomiting. 18 tablet 0   omeprazole (PRILOSEC) 40 MG capsule Take 1 capsule by mouth at bedtime. Take days 1 through 5 after each chemotherapy dose 60 capsule 0   ondansetron (ZOFRAN) 8 MG tablet Take 1 tablet by mouth every 8 hours as needed for nausea or vomiting. Start day 3 after chemotherapy. 20 tablet 0   prochlorperazine (COMPAZINE) 10 MG tablet Take one tablet before meals days 2 and 3 after each chemotherapy cycle. 30 tablet 1   promethazine (PHENERGAN) 25 MG suppository Unwrap and insert 1  suppository (25 mg total) rectally every 8 (eight) hours as needed for up to 6 doses for nausea or vomiting. 6 each 0   No current facility-administered medications for this visit.   Facility-Administered Medications Ordered in Other Visits  Medication Dose Route Frequency Provider Last Rate Last Admin   acetaminophen (TYLENOL) 500 MG tablet            ondansetron (ZOFRAN) 4 MG/2ML injection  ondansetron (ZOFRAN) 4 MG/2ML injection            sodium chloride flush (NS) 0.9 % injection 10 mL  10 mL Intracatheter PRN Samar Dass, Virgie Dad, MD   10 mL at 02/10/21 1716    OBJECTIVE: African-American Hansen who appears stated age  Vitals:   03/10/21 0852  BP: 137/87  Pulse: 67  Resp: 16  Temp: 98.1 F (36.7 C)  SpO2: 100%       Body mass index is 30.25 kg/m.   Wt Readings from Last 3 Encounters:  03/10/21 216 lb 14.4 oz (98.4 kg)  02/25/21 216 lb 8 oz (98.2 kg)  02/20/21 216 lb 12.8 oz (98.3 kg)     ECOG FS:1 - Symptomatic but completely ambulatory  Sclerae unicteric, EOMs intact Wearing a mask No cervical or supraclavicular adenopathy Lungs no rales or rhonchi Heart regular rate and rhythm Abd soft, nontender, positive bowel sounds MSK no focal spinal tenderness, no upper extremity lymphedema Neuro: nonfocal, well oriented, appropriate affect Breasts: The mass in the right breast is still palpable, measures approximately 4 cm, movable, with a slight indentation of the skin but no erythema or ulceration.  The left breast and both axillae are benign   LAB RESULTS:  CMP     Component Value Date/Time   NA 139 03/02/2021 1533   NA 139 10/06/2020 1432   K 3.6 03/02/2021 1533   CL 104 03/02/2021 1533   CO2 27 03/02/2021 1533   GLUCOSE 100 (H) 03/02/2021 1533   BUN 11 03/02/2021 1533   BUN 11 10/06/2020 1432   CREATININE 0.70 03/02/2021 1533   CALCIUM 9.2 03/02/2021 1533   PROT 6.6 03/02/2021 1533   PROT 6.9 10/06/2020 1432   ALBUMIN 3.5 03/02/2021 1533    ALBUMIN 4.1 10/06/2020 1432   AST 14 (L) 03/02/2021 1533   ALT 14 03/02/2021 1533   ALKPHOS 149 (H) 03/02/2021 1533   BILITOT 0.3 03/02/2021 1533   GFRNONAA >60 03/02/2021 1533   GFRAA >60 09/07/2019 1340    Lab Results  Component Value Date   TOTALPROTELP 7.0 12/31/2011   ALBUMINELP 56.4 12/31/2011   A1GS 4.3 12/31/2011   A2GS 10.4 12/31/2011   BETS 6.4 12/31/2011   BETA2SER 4.6 12/31/2011   GAMS 17.9 12/31/2011   MSPIKE NOT DETECTED 12/31/2011   SPEI (NOTE) 12/31/2011    Lab Results  Component Value Date   WBC 23.4 (H) 03/02/2021   NEUTROABS 22.0 (H) 03/02/2021   HGB 8.3 (L) 03/02/2021   HCT 25.5 (L) 03/02/2021   MCV 73.1 (L) 03/02/2021   PLT 268 03/02/2021    No results found for: LABCA2  No components found for: GYFVCB449  No results for input(s): INR in the last 168 hours.  No results found for: LABCA2  No results found for: QPR916  No results found for: BWG665  No results found for: LDJ570  Lab Results  Component Value Date   CA2729 66.9 (H) 02/10/2021    No components found for: HGQUANT  No results found for: CEA1 / No results found for: CEA1   No results found for: AFPTUMOR  No results found for: [facility]  Lab Results  Component Value Date   KAPLAMBRATIO 10.08 12/31/2011   (kappa/lambda light chains)  No results found for: HGBA, HGBA2QUANT, HGBFQUANT, HGBSQUAN (Hemoglobinopathy evaluation)   Lab Results  Component Value Date   LDH 194 12/31/2011    Lab Results  Component Value Date   IRON 95 12/31/2011   TIBC 325  12/31/2011   IRONPCTSAT 29 12/31/2011   (Iron and TIBC)  Lab Results  Component Value Date   FERRITIN 37 12/31/2011    Urinalysis    Component Value Date/Time   COLORURINE YELLOW 09/07/2019 1341   APPEARANCEUR HAZY (A) 09/07/2019 1341   LABSPEC 1.021 09/07/2019 1341   PHURINE 9.0 (H) 09/07/2019 1341   GLUCOSEU NEGATIVE 09/07/2019 1341   HGBUR NEGATIVE 09/07/2019 1341   BILIRUBINUR NEGATIVE 09/07/2019  1341   KETONESUR NEGATIVE 09/07/2019 1341   PROTEINUR 30 (A) 09/07/2019 1341   UROBILINOGEN 0.2 11/01/2013 1246   NITRITE NEGATIVE 09/07/2019 1341   LEUKOCYTESUR NEGATIVE 09/07/2019 1341    STUDIES: No results found.   ELIGIBLE FOR AVAILABLE RESEARCH PROTOCOL: no  ASSESSMENT: 50 y.o. Chelsea Hansen status post bilateral breast biopsies 12/03/2020, showing  (1) on the right, a clinical T2 N1, stage IIa invasive lobular carcinoma, grade 2, estrogen and progesterone receptor strongly positive, HER2 not amplified, with an MIB-1 of 40%   (a) the biopsied right axillary lymph node was positive with extracapsular extension   (b) a second right breast mass also biopsied was a fibroadenoma, concordant   (c) biopsy of an area of non-mass-like enhancement in the upper right breast pending   (2) on the left, a clinical T1b N0, stage IA invasive ductal carcinoma, grade 1 or 2, estrogen and progesterone receptor positive, HER2 not amplified, with an MIB 1 of 15%   (a) biopsy of a 0.4 cm enhancement in the medial left breast pending  (3) genetics testing through the [facility] BRCAplus panel (report date 12/24/2020) or the CancerNext-Expanded + RNAinsight panel (report date 12/31/2020) found no deleterious mutations in ATM, BRCA1, BRCA2, CDH1, CHEK2, PALB2, PTEN, and TP53. The CancerNext-Expanded + RNAinsight gene panel offered by [employer] and includes sequencing and rearrangement analysis for the following 77 genes: AIP, ALK, APC, ATM, AXIN2, BAP1, BARD1, BLM, BMPR1A, BRCA1, BRCA2, BRIP1, CDC73, CDH1, CDK4, CDKN1B, CDKN2A, CHEK2, CTNNA1, DICER1, FANCC, FH, FLCN, GALNT12, KIF1B, LZTR1, MAX, MEN1, MET, MLH1, MSH2, MSH3, MSH6, MUTYH, NBN, NF1, NF2, NTHL1, PALB2, PHOX2B, PMS2, POT1, PRKAR1A, PTCH1, PTEN, RAD51C, RAD51D, RB1, RECQL, RET, SDHA, SDHAF2, SDHB, SDHC, SDHD, SMAD4, SMARCA4, SMARCB1, SMARCE1, STK11, SUFU, TMEM127, TP53, TSC1, TSC2, VHL and XRCC2 (sequencing and deletion/duplication); EGFR, EGLN1,  HOXB13, KIT, MITF, PDGFRA, POLD1 and POLE (sequencing only); EPCAM and GREM1 (deletion/duplication only). RNA data is routinely analyzed for use in variant interpretation for all genes.  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  (5) definitive surgery to follow chemotherapy  (6) chemotherapy consisting of doxorubicin and cyclophosphamide in dose dense fashion x4 started 01/27/2021 to be followed by weekly paclitaxel x12.  (7) adjuvant radiation on the right, possibly also on the left  (8) antiestrogens to follow at the completion of local treatment  (9) oral mass noted at time of intubation for port placement:  (A) CT head and neck 02/02/2021 negative  (B) ENT evaluation Dr. Kandis Cocking 02/06/2021 benign note from her February 06 2021 visit notes mild mucositis, thrush, and reflux.  When we see Amariyah again we will start her on a PPI and Diflucan for thrush   PLAN: Julietta will proceed to her fourth and final cycle of Cytoxan and Adriamycin today.  She receives fluids the next 3 days and of course 2 days from now she receives  her pegfilgrastim.  She takes Claritin for that and then goes to bed a day or 2 because she says nothing helps the pain except just rest.  As noted above she has a trip to [place] planned next week.  I think that is wonderful.  If she has any problems there she can call us and we should be able to intervene even though she will be in a different state.  She will then return in 2 weeks.  That will be her first dose of paclitaxel.  She understands she will not need a PEG fill gastrium dose on day 3 and she will not need fluids on days 2 3 and 4 as she has been receiving.  She also will not need to take the dexamethasone for nausea control.  She will only use Compazine for  nausea control.  At that visit we will ask her to keep a symptom diary so when she returns with her second paclitaxel dose we can troubleshoot any symptoms she is having  She knows to call for any other issue that may develop before the next visit  Total encounter time 35 minutes.Sarajane Jews C. Sanjeev Main, MD 03/10/21 9:05 AM Medical Oncology and Hematology [facility] [place], [place] [address] Tel. [phone]    Fax. [phone]   I, Wilburn Mylar, am acting as scribe for Dr. Virgie Dad. Huxton Glaus.  I, Lurline Del MD, have reviewed the above documentation for accuracy and completeness, and I agree with the above.    *Total Encounter Time as defined by the Centers for Medicare and Medicaid Services includes, in addition to the face-to-face time of a patient visit (documented in the note above) non-face-to-face time: obtaining and reviewing outside history, ordering and reviewing medications, tests or procedures, care coordination (communications with other health care professionals or caregivers) and documentation in the medical record.

## 2021-03-10 ENCOUNTER — Encounter: Payer: Self-pay | Admitting: *Deleted

## 2021-03-10 ENCOUNTER — Inpatient Hospital Stay: Payer: Medicaid Other

## 2021-03-10 ENCOUNTER — Other Ambulatory Visit: Payer: Self-pay

## 2021-03-10 ENCOUNTER — Inpatient Hospital Stay (HOSPITAL_BASED_OUTPATIENT_CLINIC_OR_DEPARTMENT_OTHER): Payer: Medicaid Other | Admitting: Oncology

## 2021-03-10 VITALS — BP 137/87 | HR 67 | Temp 98.1°F | Resp 16 | Ht 71.0 in | Wt 216.9 lb

## 2021-03-10 DIAGNOSIS — Z5111 Encounter for antineoplastic chemotherapy: Secondary | ICD-10-CM | POA: Diagnosis not present

## 2021-03-10 DIAGNOSIS — C50411 Malignant neoplasm of upper-outer quadrant of right female breast: Secondary | ICD-10-CM | POA: Diagnosis not present

## 2021-03-10 DIAGNOSIS — C50412 Malignant neoplasm of upper-outer quadrant of left female breast: Secondary | ICD-10-CM

## 2021-03-10 DIAGNOSIS — Z170 Estrogen receptor positive status [ER+]: Secondary | ICD-10-CM

## 2021-03-10 DIAGNOSIS — Z95828 Presence of other vascular implants and grafts: Secondary | ICD-10-CM

## 2021-03-10 LAB — CBC WITH DIFFERENTIAL/PLATELET
Abs Immature Granulocytes: 1.51 10*3/uL — ABNORMAL HIGH (ref 0.00–0.07)
Basophils Absolute: 0.1 10*3/uL (ref 0.0–0.1)
Basophils Relative: 0 %
Eosinophils Absolute: 0 10*3/uL (ref 0.0–0.5)
Eosinophils Relative: 0 %
HCT: 27.5 % — ABNORMAL LOW (ref 36.0–46.0)
Hemoglobin: 8.6 g/dL — ABNORMAL LOW (ref 12.0–15.0)
Immature Granulocytes: 14 %
Lymphocytes Relative: 20 %
Lymphs Abs: 2.2 10*3/uL (ref 0.7–4.0)
MCH: 23.4 pg — ABNORMAL LOW (ref 26.0–34.0)
MCHC: 31.3 g/dL (ref 30.0–36.0)
MCV: 74.9 fL — ABNORMAL LOW (ref 80.0–100.0)
Monocytes Absolute: 0.8 10*3/uL (ref 0.1–1.0)
Monocytes Relative: 7 %
Neutro Abs: 6.6 10*3/uL (ref 1.7–7.7)
Neutrophils Relative %: 59 %
Platelets: 279 10*3/uL (ref 150–400)
RBC: 3.67 MIL/uL — ABNORMAL LOW (ref 3.87–5.11)
RDW: 19.9 % — ABNORMAL HIGH (ref 11.5–15.5)
WBC: 11.2 10*3/uL — ABNORMAL HIGH (ref 4.0–10.5)
nRBC: 0.4 % — ABNORMAL HIGH (ref 0.0–0.2)

## 2021-03-10 LAB — COMPREHENSIVE METABOLIC PANEL
ALT: 18 U/L (ref 0–44)
AST: 14 U/L — ABNORMAL LOW (ref 15–41)
Albumin: 3.7 g/dL (ref 3.5–5.0)
Alkaline Phosphatase: 138 U/L — ABNORMAL HIGH (ref 38–126)
Anion gap: 10 (ref 5–15)
BUN: 12 mg/dL (ref 6–20)
CO2: 25 mmol/L (ref 22–32)
Calcium: 9.2 mg/dL (ref 8.9–10.3)
Chloride: 105 mmol/L (ref 98–111)
Creatinine, Ser: 0.88 mg/dL (ref 0.44–1.00)
GFR, Estimated: >60 mL/min (ref >60)
Glucose, Bld: 129 mg/dL — ABNORMAL HIGH (ref 70–99)
Potassium: 3.4 mmol/L — ABNORMAL LOW (ref 3.5–5.1)
Sodium: 140 mmol/L (ref 135–145)
Total Bilirubin: 0.2 mg/dL — ABNORMAL LOW (ref 0.3–1.2)
Total Protein: 6.9 g/dL (ref 6.5–8.1)

## 2021-03-10 MED ORDER — SODIUM CHLORIDE 0.9 % IV SOLN
150.0000 mg | Freq: Once | INTRAVENOUS | Status: AC
  Administered 2021-03-10: 150 mg via INTRAVENOUS
  Filled 2021-03-10: qty 150

## 2021-03-10 MED ORDER — SODIUM CHLORIDE 0.9 % IV SOLN
10.0000 mg | Freq: Once | INTRAVENOUS | Status: AC
  Administered 2021-03-10: 10 mg via INTRAVENOUS
  Filled 2021-03-10: qty 10

## 2021-03-10 MED ORDER — PALONOSETRON HCL INJECTION 0.25 MG/5ML
0.2500 mg | Freq: Once | INTRAVENOUS | Status: AC
  Administered 2021-03-10: 0.25 mg via INTRAVENOUS
  Filled 2021-03-10: qty 5

## 2021-03-10 MED ORDER — SODIUM CHLORIDE 0.9% FLUSH
10.0000 mL | Freq: Once | INTRAVENOUS | Status: AC
  Administered 2021-03-10: 10 mL

## 2021-03-10 MED ORDER — SODIUM CHLORIDE 0.9% FLUSH
10.0000 mL | INTRAVENOUS | Status: DC | PRN
Start: 2021-03-10 — End: 2021-03-10
  Administered 2021-03-10: 10 mL

## 2021-03-10 MED ORDER — HEPARIN SOD (PORK) LOCK FLUSH 100 UNIT/ML IV SOLN
500.0000 [IU] | Freq: Once | INTRAVENOUS | Status: AC | PRN
  Administered 2021-03-10: 500 [IU]

## 2021-03-10 MED ORDER — SODIUM CHLORIDE 0.9 % IV SOLN: Freq: Once | INTRAVENOUS | Status: AC

## 2021-03-10 MED ORDER — SODIUM CHLORIDE 0.9 % IV SOLN
600.0000 mg/m2 | Freq: Once | INTRAVENOUS | Status: AC
  Administered 2021-03-10: 1340 mg via INTRAVENOUS
  Filled 2021-03-10: qty 67

## 2021-03-10 MED ORDER — DOXORUBICIN HCL CHEMO IV INJECTION 2 MG/ML
60.0000 mg/m2 | Freq: Once | INTRAVENOUS | Status: AC
  Administered 2021-03-10: 134 mg via INTRAVENOUS
  Filled 2021-03-10: qty 67

## 2021-03-10 MED ORDER — OLANZAPINE 5 MG PO TABS
5.0000 mg | ORAL_TABLET | Freq: Once | ORAL | Status: AC
  Administered 2021-03-10: 5 mg via ORAL
  Filled 2021-03-10: qty 1

## 2021-03-10 NOTE — Patient Instructions (Signed)
[facility]  Discharge Instructions: Thank you for choosing [facility] to provide your oncology and hematology care.   If you have a lab appointment with the [facility], please go directly to the [facility] and check in at the registration area.   Wear comfortable clothing and clothing appropriate for easy access to any Portacath or PICC line.   We strive to give you quality time with your provider. You may need to reschedule your appointment if you arrive late (15 or more minutes).  Arriving late affects you and other patients whose appointments are after yours.  Also, if you miss three or more appointments without notifying the office, you may be dismissed from the clinic at the provider's discretion.      For prescription refill requests, have your pharmacy contact our office and allow 72 hours for refills to be completed.    Today you received the following chemotherapy and/or immunotherapy agents: Adriamycin & Cytoxan    To help prevent nausea and vomiting after your treatment, we encourage you to take your nausea medication as directed.  BELOW ARE SYMPTOMS THAT SHOULD BE REPORTED IMMEDIATELY: *FEVER GREATER THAN 100.4 F (38 C) OR HIGHER *CHILLS OR SWEATING *NAUSEA AND VOMITING THAT IS NOT CONTROLLED WITH YOUR NAUSEA MEDICATION *UNUSUAL SHORTNESS OF BREATH *UNUSUAL BRUISING OR BLEEDING *URINARY PROBLEMS (pain or burning when urinating, or frequent urination) *BOWEL PROBLEMS (unusual diarrhea, constipation, pain near the anus) TENDERNESS IN MOUTH AND THROAT WITH OR WITHOUT PRESENCE OF ULCERS (sore throat, sores in mouth, or a toothache) UNUSUAL RASH, SWELLING OR PAIN  UNUSUAL VAGINAL DISCHARGE OR ITCHING   Items with * indicate a potential emergency and should be followed up as soon as possible or go to the Emergency Department if any problems should occur.  Please show the CHEMOTHERAPY ALERT CARD or IMMUNOTHERAPY ALERT CARD at  check-in to the Emergency Department and triage nurse.  Should you have questions after your visit or need to cancel or reschedule your appointment, please contact [facility]  Dept: [phone]  and follow the prompts.  Office hours are 8:00 a.m. to 4:30 p.m. Monday - Friday. Please note that voicemails left after 4:00 p.m. may not be returned until the following business day.  We are closed weekends and major holidays. You have access to a nurse at all times for urgent questions. Please call the main number to the clinic Dept: [phone] and follow the prompts.   For any non-urgent questions, you may also contact your provider using MyChart. We now offer e-Visits for anyone 46 and older to request care online for non-urgent symptoms. For details visit [email or web address].   Also download the MyChart app! Go to the app store, search "MyChart", open the app, select [facility], and log in with your MyChart username and password.  Due to Covid, a mask is required upon entering the hospital/clinic. If you do not have a mask, one will be given to you upon arrival. For doctor visits, patients may have 1 support person aged 32 or older with them. For treatment visits, patients cannot have anyone with them due to current Covid guidelines and our immunocompromised population.

## 2021-03-11 ENCOUNTER — Inpatient Hospital Stay: Payer: Medicaid Other

## 2021-03-11 ENCOUNTER — Encounter: Payer: Self-pay | Admitting: Oncology

## 2021-03-11 VITALS — BP 143/82 | HR 59 | Temp 97.9°F | Resp 18

## 2021-03-11 DIAGNOSIS — Z5111 Encounter for antineoplastic chemotherapy: Secondary | ICD-10-CM | POA: Diagnosis not present

## 2021-03-11 DIAGNOSIS — Z95828 Presence of other vascular implants and grafts: Secondary | ICD-10-CM

## 2021-03-11 MED ORDER — SODIUM CHLORIDE 0.9 % IV SOLN: INTRAVENOUS | Status: DC

## 2021-03-11 MED ORDER — HEPARIN SOD (PORK) LOCK FLUSH 100 UNIT/ML IV SOLN
500.0000 [IU] | Freq: Once | INTRAVENOUS | Status: AC
  Administered 2021-03-11: 500 [IU]

## 2021-03-11 MED ORDER — SODIUM CHLORIDE 0.9% FLUSH
10.0000 mL | Freq: Once | INTRAVENOUS | Status: AC
  Administered 2021-03-11: 10 mL

## 2021-03-11 NOTE — Patient Instructions (Signed)

## 2021-03-11 NOTE — Progress Notes (Signed)
Met with patient at registration whom had questions regarding how to use the grant.  Went over and referred back to expense sheet of how grant could be used. Gave her another expense sheet.  She has my card and paperwork in green folder for any additional financial questions or concerns.

## 2021-03-12 ENCOUNTER — Inpatient Hospital Stay: Payer: Medicaid Other

## 2021-03-12 ENCOUNTER — Other Ambulatory Visit: Payer: Self-pay

## 2021-03-12 VITALS — BP 133/80 | HR 62 | Temp 98.2°F | Resp 18

## 2021-03-12 DIAGNOSIS — C50411 Malignant neoplasm of upper-outer quadrant of right female breast: Secondary | ICD-10-CM

## 2021-03-12 DIAGNOSIS — Z95828 Presence of other vascular implants and grafts: Secondary | ICD-10-CM

## 2021-03-12 DIAGNOSIS — Z170 Estrogen receptor positive status [ER+]: Secondary | ICD-10-CM

## 2021-03-12 DIAGNOSIS — Z5111 Encounter for antineoplastic chemotherapy: Secondary | ICD-10-CM | POA: Diagnosis not present

## 2021-03-12 MED ORDER — SODIUM CHLORIDE 0.9% FLUSH
10.0000 mL | Freq: Once | INTRAVENOUS | Status: AC
  Administered 2021-03-12: 10 mL

## 2021-03-12 MED ORDER — PEGFILGRASTIM-CBQV 6 MG/0.6ML ~~LOC~~ SOSY
6.0000 mg | PREFILLED_SYRINGE | Freq: Once | SUBCUTANEOUS | Status: AC
  Administered 2021-03-12: 6 mg via SUBCUTANEOUS
  Filled 2021-03-12: qty 0.6

## 2021-03-12 MED ORDER — SODIUM CHLORIDE 0.9 % IV SOLN: INTRAVENOUS | Status: DC

## 2021-03-12 MED ORDER — HEPARIN SOD (PORK) LOCK FLUSH 100 UNIT/ML IV SOLN
500.0000 [IU] | Freq: Once | INTRAVENOUS | Status: AC
  Administered 2021-03-12: 500 [IU]

## 2021-03-13 ENCOUNTER — Inpatient Hospital Stay: Payer: Medicaid Other

## 2021-03-18 ENCOUNTER — Encounter: Payer: Self-pay | Admitting: *Deleted

## 2021-03-23 NOTE — Progress Notes (Signed)
[place]  Telephone:([phone] Fax:([phone]     ID: Chelsea Hansen DOB: [birth year]  MR#: [id number]  IEP#:[id number]  Patient Care Team: [name], NP as PCP - General (Internal Medicine) [name], RN as Oncology Nurse Navigator [name], RN as Oncology Nurse Navigator [name], MD as Consulting Physician (Radiation Oncology) Magrinat, Virgie Dad, MD as Consulting Physician (Oncology) [name], MD as Consulting Physician (General Surgery) Scot Dock, NP OTHER MD:  CHIEF COMPLAINT: Bilateral estrogen receptor positive breast cancer  CURRENT TREATMENT:  neoadjuvant chemotherapy   INTERVAL HISTORY: [name] returns today for follow up and treatment of her bilateral breast cancer.   She has just completed four cycles of doxorubicin and cyclophosphamide and is due to start weekly Paclitaxel x 12 today.  She was visiting her grandmother last week in [place], she notes that there was no damage to her house or near her house.  She returned yesterday.  She notes some mild fatigue, but overall is feeling moderately well and is ready to proceed with her fist weekly Paclitaxel.   REVIEW OF SYSTEMS: Review of Systems  Constitutional:  Positive for fatigue. Negative for appetite change, chills, fever and unexpected weight change.  HENT:   Negative for hearing loss, lump/mass and trouble swallowing.   Eyes:  Negative for eye problems and icterus.  Respiratory:  Negative for chest tightness, cough and shortness of breath.   Cardiovascular:  Negative for chest pain, leg swelling and palpitations.  Gastrointestinal:  Negative for abdominal distention, abdominal pain, constipation, diarrhea, nausea and vomiting.  Endocrine: Negative for hot flashes.  Genitourinary:  Negative for difficulty urinating.   Musculoskeletal:  Negative for arthralgias.  Skin:  Negative for itching and rash.  Neurological:  Negative for dizziness,  extremity weakness, headaches and numbness.  Hematological:  Negative for adenopathy. Does not bruise/bleed easily.  Psychiatric/Behavioral:  Negative for depression. The patient is not nervous/anxious.      COVID 19 VACCINATION STATUS: [place] x2, most recently 10/2020, no booster as of June 49   HISTORY OF CURRENT ILLNESS: From the original intake note:  Chelsea Hansen has a history of excisional biopsy of a fibroadenoma from the lower outer right breast in 2011.  More recently she noted a knot in her right breast.  She brought it to medical attention and proceeded to bilateral diagnostic mammography with tomography and bilateral breast ultrasonography at [facility] on 11/18/2020 showing: breast density category B;   RIGHT: 3.2 cm mass associated with architectural distortion and scattered calcifications in upper-outer right breast at 10 o'clock, corresponding to palpable concern; indeterminate 1.1 cm mass in upper-outer right breast at 9:30; solitary pathologic right axillary lymph node with eccentric cortical thickening up to 7 mm. LEFT: 0.8 cm mass involving upper-outer left breast at 2 o'clock; no pathologic left axillary lymphadenopathy.  Accordingly on 12/03/2020 she proceeded to biopsy of the bilateral breast areas in question. The pathology from this procedure ([id number]) showed:  Right Breast, 9:30 fibroadenoma Right Breast, 10 o'clock Invasive and in situ mammary carcinoma, e-cadherin negative, grade 2 Prognostic indicators significant for: estrogen receptor, 95% positive and progesterone receptor, 100% positive, both with strong staining intensity. Proliferation marker Ki67 at 40%. HER2 negative by immunohistochemistry (0). 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 Grade or-2  Prognostic indicators  significant for:  estrogen receptor, 95% positive and progesterone receptor, 100% positive, both with strong staining intensity. Proliferation marker Ki67 at 15%. HER2 negative by immunohistochemistry (1+).  Cancer Staging Malignant neoplasm of upper-outer quadrant of left breast in female, estrogen receptor positive ([place]) Staging form: Breast, AJCC 8th Edition - Clinical stage from 12/17/2020: cT1b, cN0, cM0, ER+, PR+, HER2- - Signed by [name], MD on 12/17/2020 Stage prefix: Initial diagnosis Laterality: Left Staged by: Pathologist and managing physician Stage used in treatment planning: Yes National guidelines used in treatment planning: Yes Type of national guideline used in treatment planning: NCCN  Malignant neoplasm of upper-outer quadrant of right breast in female, estrogen receptor positive ([place]) Staging form: Breast, AJCC 8th Edition - Clinical stage from 12/17/2020: cT2, cN1, cM0, ER+, PR+, HER2- - Signed by [name], MD on 12/17/2020 Stage prefix: Initial diagnosis Laterality: Right Staged by: Pathologist and managing physician Stage used in treatment planning: Yes National guidelines used in treatment planning: Yes Type of national guideline used in treatment planning: NCCN  The patient's subsequent history is as detailed below.   PAST MEDICAL HISTORY: Past Medical History:  Diagnosis Date   Allergy    seasonal allergies   Anxiety    on meds   Asthma    uses inhaler   Breast cancer ([place]) 2012   RIGHT lumpectomy   Cancer ([place]) 2022   RIGHT breast lump-dx 2022   Depression    on meds   DVT (deep venous thrombosis) ([place]) 2010   after hysterectomy   Family history of uterine cancer    GERD (gastroesophageal reflux disease)    with certain foods/OTC PRN meds   Headache(784.0)    Hyperlipidemia    on meds   Hypertension    on meds    PAST SURGICAL HISTORY: Past Surgical History:  Procedure Laterality Date   ABDOMINAL HYSTERECTOMY      BREAST EXCISIONAL BIOPSY Right 09/16/2009   BREAST SURGERY     lumpectomy   PORTACATH PLACEMENT Left 01/26/2021   Procedure: INSERTION PORT-A-CATH;  Surgeon: [name], MD;  Location: [facility];  Service: General;  Laterality: Left;   TUBAL LIGATION    Patient's ovaries are still in place, status post hysterectomy 08/18/2010 with benign pathology   FAMILY HISTORY: Family History  Problem Relation Age of Onset   Cancer Mother        ?, dx <50   Hypertension Father    Cancer Maternal Aunt        uterine vs ovarian, dx <50   Cancer Maternal Aunt        unknown type, dx <50   Uterine cancer Cousin 19   Colon polyps Neg Hx    Colon cancer Neg Hx    Esophageal cancer Neg Hx    Stomach cancer Neg Hx    Her father is age 85 and her mother age 31 as of 11/2020. Her mother has a history of cancer (type unsure).  The patient's mother had two sisters also with cancer, one with uterine and the other cancer type is unknown to the patient. Kaytelyn has 1 brother and 1 sister.    GYNECOLOGIC HISTORY:  No LMP recorded. Patient has had a hysterectomy. Menarche: 50 years old Age at first live birth: 50 years old [place] P 5 LMP 2012, with hysterectomy Contraceptive never used HRT never used  Hysterectomy? Yes, 07/2010 for fibroids (pathology in Epic) BSO? 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 She is single and lives at home with her three sons. Her two daughters also live here in [place]. Daughter Erline Siddoway, age 42, works at [employer]. Daughter Jilda Panda Sample, age 67, and son Isiah Nils Pyle, age 83, work at [employer]. Son Sharlee Blew, age 71, and daughter Alvera Novel, age 72, are currently not employed. 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    ADVANCED DIRECTIVES: not in place   HEALTH MAINTENANCE: Social History   Tobacco Use   Smoking status: Never   Smokeless tobacco: Never  Vaping Use   Vaping Use: Never used  Substance Use  Topics   Alcohol use: Not Currently    Alcohol/week: 7.0 standard drinks    Types: 7 Standard drinks or equivalent per week    Comment: occassional   Drug use: No     Colonoscopy: 10/2020 (Dr. Fuller Plan), recall 2027  PAP: 05/2010, negative, repeat not indicated (s/p hysterectomy)  Bone density: n/a (age)   No Known Allergies  Current Outpatient Medications  Medication Sig Dispense Refill   acetaminophen (TYLENOL) 500 MG tablet Take 1 tablet by mouth 3 times daily as needed. Take with Aleve 220 mg 90 tablet 0   albuterol (VENTOLIN HFA) 108 (90 Base) MCG/ACT inhaler Inhale 2 puffs into the lungs every 6 (six) hours as needed for wheezing or shortness of breath. 18 g 2   amLODipine (NORVASC) 10 MG tablet Take 1 tablet (10 mg total) by mouth daily. 90 tablet 1   Cholecalciferol (VITAMIN D3) 2000 units capsule Take 2,000 Units by mouth daily.  11   escitalopram (LEXAPRO) 20 MG tablet Take 1 tablet (20 mg total) by mouth daily. 90 tablet 1   fluconazole (DIFLUCAN) 100 MG tablet Take 1 tablet by mouth at bedtime. Take days 1 through 5 after each  chemotherapy dose 20 tablet 0   fluticasone (FLONASE) 50 MCG/ACT nasal spray Use 2 sprays in each nostril daily. 16 g 11   hydrochlorothiazide (HYDRODIURIL) 25 MG tablet Take 1 tablet (25 mg total) by mouth daily. 90 tablet 1   lidocaine-prilocaine (EMLA) cream Apply to affected area once as directed 30 g 3   montelukast (SINGULAIR) 10 MG tablet Take 1 tablet (10 mg total) by mouth at bedtime. 90 tablet 3   Multiple Vitamin (MULTIVITAMIN) tablet Take 2 tablets by mouth daily.     omeprazole (PRILOSEC) 40 MG capsule Take 1 capsule by mouth at bedtime. Take days 1 through 5 after each chemotherapy dose 60 capsule 0   ondansetron (ZOFRAN) 8 MG tablet Take 1 tablet by mouth every 8 hours as needed for nausea or vomiting. Start day 3 after chemotherapy. 20 tablet 0   prochlorperazine (COMPAZINE) 10 MG tablet Take one tablet before meals days 2 and 3 after each  chemotherapy cycle. 30 tablet 1   promethazine (PHENERGAN) 25 MG suppository Unwrap and insert 1 suppository (25 mg total) rectally every 8 (eight) hours as needed for up to 6 doses for nausea or vomiting. 6 each 0   No current facility-administered medications for this visit.   Facility-Administered Medications Ordered in Other Visits  Medication Dose Route Frequency Provider Last Rate Last Admin   acetaminophen (TYLENOL) 500 MG tablet            ondansetron (ZOFRAN) 4 MG/2ML injection            ondansetron (ZOFRAN) 4 MG/2ML injection            sodium chloride flush (NS) 0.9 % injection 10 mL  10 mL Intracatheter PRN Magrinat, Virgie Dad, MD   10 mL at 02/10/21 1716    OBJECTIVE: African-American woman who appears stated age  Vitals:   03/24/21 0810  BP: 138/90  Pulse: 70  Temp: 97.7 F (36.5 C)  SpO2: 100%        Body mass index is 30.7 kg/m.   Wt Readings from Last 3 Encounters:  03/24/21 220 lb 1.6 oz (99.8 kg)  03/10/21 216 lb 14.4 oz (98.4 kg)  02/25/21 216 lb 8 oz (98.2 kg)     ECOG FS:1 - Symptomatic but completely ambulatory GENERAL: Patient is a well appearing female in no acute distress HEENT:  Sclerae anicteric.  Oropharynx clear and moist. No ulcerations or evidence of oropharyngeal candidiasis. Neck is supple.  NODES:  No cervical, supraclavicular, or axillary lymphadenopathy palpated.  BREAST EXAM:  No progression of her breast mass noted LUNGS:  Clear to auscultation bilaterally.  No wheezes or rhonchi. HEART:  Regular rate and rhythm. No murmur appreciated. ABDOMEN:  Soft, nontender.  Positive, normoactive bowel sounds. No organomegaly palpated. MSK:  No focal spinal tenderness to palpation. Full range of motion bilaterally in the upper extremities. EXTREMITIES:  No peripheral edema.   SKIN:  Clear with no obvious rashes or skin changes. No nail dyscrasia. NEURO:  Nonfocal. Well oriented.  Appropriate affect.    LAB RESULTS:  CMP     Component Value  Date/Time   NA 140 03/10/2021 0836   NA 139 10/06/2020 1432   K 3.4 (L) 03/10/2021 0836   CL 105 03/10/2021 0836   CO2 25 03/10/2021 0836   GLUCOSE 129 (H) 03/10/2021 0836   BUN 12 03/10/2021 0836   BUN 11 10/06/2020 1432   CREATININE 0.88 03/10/2021 0836   CREATININE 0.70 03/02/2021 1533   CALCIUM 9.2 03/10/2021 0836   PROT 6.9 03/10/2021 0836   PROT 6.9 10/06/2020 1432   ALBUMIN 3.7 03/10/2021 0836   ALBUMIN 4.1 10/06/2020 1432   AST 14 (L) 03/10/2021 0836   AST 14 (L) 03/02/2021 1533   ALT 18 03/10/2021 0836   ALT 14 03/02/2021 1533   ALKPHOS 138 (H) 03/10/2021 0836   BILITOT 0.2 (L) 03/10/2021 0836   BILITOT 0.3 03/02/2021 1533   GFRNONAA >60 03/10/2021 0836   GFRNONAA >60 03/02/2021 1533   GFRAA >60 09/07/2019 1340    Lab Results  Component Value Date   TOTALPROTELP 7.0 12/31/2011   ALBUMINELP 56.4 12/31/2011   A1GS 4.3 12/31/2011   A2GS 10.4 12/31/2011   BETS 6.4 12/31/2011   BETA2SER 4.6 12/31/2011   GAMS 17.9 12/31/2011   MSPIKE NOT DETECTED 12/31/2011   SPEI (NOTE) 12/31/2011    Lab Results  Component Value Date   WBC 12.9 (H) 03/24/2021   NEUTROABS 8.2 (H) 03/24/2021   HGB 8.4 (L) 03/24/2021   HCT 26.8 (L) 03/24/2021   MCV 75.3 (L) 03/24/2021   PLT 337 03/24/2021    No results found for: LABCA2  No components found for: [id number]  No results for input(s): INR in the last 168 hours.  No results found for: LABCA2  No results found for: SFK812  No results found for: XNT700  No results found for: FVC944  Lab Results  Component Value Date   CA2729 66.9 (H) 02/10/2021    No components found for: HGQUANT  No results found for: CEA1 / No results found for: CEA1   No results found for: AFPTUMOR  No results found for: [facility]  Lab Results  Component  Value Date   KAPLAMBRATIO 10.08 12/31/2011   (kappa/lambda light chains)  No results found for: HGBA, HGBA2QUANT, HGBFQUANT, HGBSQUAN (Hemoglobinopathy evaluation)   Lab Results   Component Value Date   LDH 194 12/31/2011    Lab Results  Component Value Date   IRON 95 12/31/2011   TIBC 325 12/31/2011   IRONPCTSAT 29 12/31/2011   (Iron and TIBC)  Lab Results  Component Value Date   FERRITIN 37 12/31/2011    Urinalysis    Component Value Date/Time   COLORURINE YELLOW 09/07/2019 1341   APPEARANCEUR HAZY (A) 09/07/2019 1341   LABSPEC 1.021 09/07/2019 1341   PHURINE 9.0 (H) 09/07/2019 1341   GLUCOSEU NEGATIVE 09/07/2019 1341   HGBUR NEGATIVE 09/07/2019 1341   BILIRUBINUR NEGATIVE 09/07/2019 1341   KETONESUR NEGATIVE 09/07/2019 1341   PROTEINUR 30 (A) 09/07/2019 1341   UROBILINOGEN 0.2 11/01/2013 1246   NITRITE NEGATIVE 09/07/2019 1341   LEUKOCYTESUR NEGATIVE 09/07/2019 1341    STUDIES: No results found.   ELIGIBLE FOR AVAILABLE RESEARCH PROTOCOL: no  ASSESSMENT: 50 y.o. [place] woman status post bilateral breast biopsies 12/03/2020, showing  (1) on the right, a clinical T2 N1, stage IIa invasive lobular carcinoma, grade 2, estrogen and progesterone receptor strongly positive, HER2 not amplified, with an MIB-1 of 40%   (a) the biopsied right axillary lymph node was positive with extracapsular extension   (b) a second right breast mass also biopsied was a fibroadenoma, concordant   (c) biopsy of an area of non-mass-like enhancement in the upper right breast pending   (2) on the left, a clinical T1b N0, stage IA invasive ductal carcinoma, grade 1 or 2, estrogen and progesterone receptor positive, HER2 not amplified, with an MIB 1 of 15%   (a) biopsy of a 0.4 cm enhancement in the medial left breast pending  (3) genetics testing through the [facility] BRCAplus panel (report date 12/24/2020) or the CancerNext-Expanded + RNAinsight panel (report date 12/31/2020) found no deleterious mutations in ATM, BRCA1, BRCA2, CDH1, CHEK2, PALB2, PTEN, and TP53. The CancerNext-Expanded + RNAinsight gene panel offered by [employer] and includes sequencing and  rearrangement analysis for the following 77 genes: AIP, ALK, APC, ATM, AXIN2, BAP1, BARD1, BLM, BMPR1A, BRCA1, BRCA2, BRIP1, CDC73, CDH1, CDK4, CDKN1B, CDKN2A, CHEK2, CTNNA1, DICER1, FANCC, FH, FLCN, GALNT12, KIF1B, LZTR1, MAX, MEN1, MET, MLH1, MSH2, MSH3, MSH6, MUTYH, NBN, NF1, NF2, NTHL1, PALB2, PHOX2B, PMS2, POT1, PRKAR1A, PTCH1, PTEN, RAD51C, RAD51D, RB1, RECQL, RET, SDHA, SDHAF2, SDHB, SDHC, SDHD, SMAD4, SMARCA4, SMARCB1, SMARCE1, STK11, SUFU, TMEM127, TP53, TSC1, TSC2, VHL and XRCC2 (sequencing and deletion/duplication); EGFR, EGLN1, HOXB13, KIT, MITF, PDGFRA, POLD1 and POLE (sequencing only); EPCAM and GREM1 (deletion/duplication only). RNA data is routinely analyzed for use in variant interpretation for all genes.  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  (5) definitive surgery to follow chemotherapy  (6) chemotherapy consisting of doxorubicin and cyclophosphamide in dose dense fashion x4 started 01/27/2021 to be followed by weekly paclitaxel x12.  (7) adjuvant radiation on the right, possibly also on the left  (8) antiestrogens to follow at the completion of local treatment  (9) oral mass noted at time of intubation for port placement:  (A) CT head and neck 02/02/2021 negative  (B) ENT evaluation Dr. Kandis Cocking 02/06/2021 benign note from her February 06 2021 visit notes mild mucositis,  thrush, and reflux.  When we see Donnice again we will start her on a PPI and Diflucan for thrush   PLAN: Callan and I discussed her upcoming chemotherapy regimen with weekly paclitaxel x 12.  She understands how to take her anti nausea medications, and to take Prochlorperazine this evening and then as needed.    We reviewed the risk of damage to the nerve tips.  She understands this and knows to  inform us should she experience this.    [place] and I discussed her overall treatment plan, including weekly chemotherapy, MRI to re evaluate the breast after chemotherapy completion, followed by surgery.  She understands this.  She let me know that she is going on a trip for 10 days in late December.  I let her know that we would plan around that.    We will see Tangela back next week for labs, evaluation, and her second cycle of treatment.  She knows to call for any questions that may arise between now and her next appointment.  We are happy to see her sooner if needed.   Total encounter time 30 minutes.Wilber Bihari, NP 03/24/21 8:50 AM Medical Oncology and Hematology [facility] [place], [place] [address] Tel. [phone]    Fax. [phone]     *Total Encounter Time as defined by the Centers for Medicare and Medicaid Services includes, in addition to the face-to-face time of a patient visit (documented in the note above) non-face-to-face time: obtaining and reviewing outside history, ordering and reviewing medications, tests or procedures, care coordination (communications with other health care professionals or caregivers) and documentation in the medical record.

## 2021-03-24 ENCOUNTER — Encounter: Payer: Self-pay | Admitting: Adult Health

## 2021-03-24 ENCOUNTER — Inpatient Hospital Stay: Payer: Medicaid Other

## 2021-03-24 ENCOUNTER — Inpatient Hospital Stay: Payer: Medicaid Other | Attending: Oncology

## 2021-03-24 ENCOUNTER — Inpatient Hospital Stay (HOSPITAL_BASED_OUTPATIENT_CLINIC_OR_DEPARTMENT_OTHER): Payer: Medicaid Other | Admitting: Adult Health

## 2021-03-24 ENCOUNTER — Other Ambulatory Visit: Payer: Self-pay

## 2021-03-24 ENCOUNTER — Encounter: Payer: Self-pay | Admitting: *Deleted

## 2021-03-24 VITALS — BP 138/90 | HR 70 | Temp 97.7°F | Wt 220.1 lb

## 2021-03-24 VITALS — BP 133/91 | HR 58 | Temp 98.0°F | Resp 17

## 2021-03-24 DIAGNOSIS — C50412 Malignant neoplasm of upper-outer quadrant of left female breast: Secondary | ICD-10-CM | POA: Diagnosis not present

## 2021-03-24 DIAGNOSIS — C50411 Malignant neoplasm of upper-outer quadrant of right female breast: Secondary | ICD-10-CM

## 2021-03-24 DIAGNOSIS — Z95828 Presence of other vascular implants and grafts: Secondary | ICD-10-CM

## 2021-03-24 DIAGNOSIS — Z170 Estrogen receptor positive status [ER+]: Secondary | ICD-10-CM | POA: Insufficient documentation

## 2021-03-24 DIAGNOSIS — Z5111 Encounter for antineoplastic chemotherapy: Secondary | ICD-10-CM | POA: Insufficient documentation

## 2021-03-24 LAB — CBC WITH DIFFERENTIAL/PLATELET
Abs Immature Granulocytes: 1.45 10*3/uL — ABNORMAL HIGH (ref 0.00–0.07)
Basophils Absolute: 0.1 10*3/uL (ref 0.0–0.1)
Basophils Relative: 1 %
Eosinophils Absolute: 0 10*3/uL (ref 0.0–0.5)
Eosinophils Relative: 0 %
HCT: 26.8 % — ABNORMAL LOW (ref 36.0–46.0)
Hemoglobin: 8.4 g/dL — ABNORMAL LOW (ref 12.0–15.0)
Immature Granulocytes: 11 %
Lymphocytes Relative: 15 %
Lymphs Abs: 1.9 10*3/uL (ref 0.7–4.0)
MCH: 23.6 pg — ABNORMAL LOW (ref 26.0–34.0)
MCHC: 31.3 g/dL (ref 30.0–36.0)
MCV: 75.3 fL — ABNORMAL LOW (ref 80.0–100.0)
Monocytes Absolute: 1.3 10*3/uL — ABNORMAL HIGH (ref 0.1–1.0)
Monocytes Relative: 10 %
Neutro Abs: 8.2 10*3/uL — ABNORMAL HIGH (ref 1.7–7.7)
Neutrophils Relative %: 63 %
Platelets: 337 10*3/uL (ref 150–400)
RBC: 3.56 MIL/uL — ABNORMAL LOW (ref 3.87–5.11)
RDW: 20.4 % — ABNORMAL HIGH (ref 11.5–15.5)
WBC: 12.9 10*3/uL — ABNORMAL HIGH (ref 4.0–10.5)
nRBC: 0.2 % (ref 0.0–0.2)

## 2021-03-24 LAB — COMPREHENSIVE METABOLIC PANEL
ALT: 12 U/L (ref 0–44)
AST: 14 U/L — ABNORMAL LOW (ref 15–41)
Albumin: 3.6 g/dL (ref 3.5–5.0)
Alkaline Phosphatase: 120 U/L (ref 38–126)
Anion gap: 12 (ref 5–15)
BUN: 10 mg/dL (ref 6–20)
CO2: 25 mmol/L (ref 22–32)
Calcium: 9.3 mg/dL (ref 8.9–10.3)
Chloride: 105 mmol/L (ref 98–111)
Creatinine, Ser: 0.75 mg/dL (ref 0.44–1.00)
GFR, Estimated: >60 mL/min (ref >60)
Glucose, Bld: 97 mg/dL (ref 70–99)
Potassium: 3.9 mmol/L (ref 3.5–5.1)
Sodium: 142 mmol/L (ref 135–145)
Total Bilirubin: <0.2 mg/dL — ABNORMAL LOW (ref 0.3–1.2)
Total Protein: 6.6 g/dL (ref 6.5–8.1)

## 2021-03-24 MED ORDER — SODIUM CHLORIDE 0.9 % IV SOLN
Freq: Once | INTRAVENOUS | Status: AC
Start: 2021-03-24 — End: 2021-03-24

## 2021-03-24 MED ORDER — SODIUM CHLORIDE 0.9% FLUSH
10.0000 mL | INTRAVENOUS | Status: DC | PRN
  Administered 2021-03-24: 10 mL

## 2021-03-24 MED ORDER — SODIUM CHLORIDE 0.9 % IV SOLN: 4.0000 mg | Freq: Once | INTRAVENOUS | Status: DC

## 2021-03-24 MED ORDER — SODIUM CHLORIDE 0.9 % IV SOLN
80.0000 mg/m2 | Freq: Once | INTRAVENOUS | Status: AC
  Administered 2021-03-24: 180 mg via INTRAVENOUS
  Filled 2021-03-24: qty 30

## 2021-03-24 MED ORDER — DEXAMETHASONE SODIUM PHOSPHATE 10 MG/ML IJ SOLN
4.0000 mg | Freq: Once | INTRAMUSCULAR | Status: AC
  Administered 2021-03-24: 4 mg via INTRAVENOUS
  Filled 2021-03-24: qty 1

## 2021-03-24 MED ORDER — HEPARIN SOD (PORK) LOCK FLUSH 100 UNIT/ML IV SOLN
500.0000 [IU] | Freq: Once | INTRAVENOUS | Status: AC | PRN
  Administered 2021-03-24: 500 [IU]

## 2021-03-24 MED ORDER — FAMOTIDINE 20 MG IN NS 100 ML IVPB
20.0000 mg | Freq: Once | INTRAVENOUS | Status: AC
  Administered 2021-03-24: 20 mg via INTRAVENOUS
  Filled 2021-03-24: qty 100

## 2021-03-24 MED ORDER — SODIUM CHLORIDE 0.9% FLUSH
10.0000 mL | Freq: Once | INTRAVENOUS | Status: AC
  Administered 2021-03-24: 10 mL

## 2021-03-24 MED ORDER — DIPHENHYDRAMINE HCL 50 MG/ML IJ SOLN
25.0000 mg | Freq: Once | INTRAMUSCULAR | Status: AC
Start: 2021-03-24 — End: 2021-03-24
  Administered 2021-03-24: 25 mg via INTRAVENOUS
  Filled 2021-03-24: qty 1

## 2021-03-24 NOTE — Patient Instructions (Addendum)
[facility]   Discharge Instructions: Thank you for choosing [facility] to provide your oncology and hematology care.   If you have a lab appointment with the [facility], please go directly to the [facility] and check in at the registration area.   Wear comfortable clothing and clothing appropriate for easy access to any Portacath or PICC line.   We strive to give you quality time with your provider. You may need to reschedule your appointment if you arrive late (15 or more minutes).  Arriving late affects you and other patients whose appointments are after yours.  Also, if you miss three or more appointments without notifying the office, you may be dismissed from the clinic at the provider's discretion.      For prescription refill requests, have your pharmacy contact our office and allow 72 hours for refills to be completed.    Today you received the following chemotherapy and/or immunotherapy agents: Paclitaxel (Taxol).      To help prevent nausea and vomiting after your treatment, we encourage you to take your nausea medication as directed.  BELOW ARE SYMPTOMS THAT SHOULD BE REPORTED IMMEDIATELY: *FEVER GREATER THAN 100.4 F (38 C) OR HIGHER *CHILLS OR SWEATING *NAUSEA AND VOMITING THAT IS NOT CONTROLLED WITH YOUR NAUSEA MEDICATION *UNUSUAL SHORTNESS OF BREATH *UNUSUAL BRUISING OR BLEEDING *URINARY PROBLEMS (pain or burning when urinating, or frequent urination) *BOWEL PROBLEMS (unusual diarrhea, constipation, pain near the anus) TENDERNESS IN MOUTH AND THROAT WITH OR WITHOUT PRESENCE OF ULCERS (sore throat, sores in mouth, or a toothache) UNUSUAL RASH, SWELLING OR PAIN  UNUSUAL VAGINAL DISCHARGE OR ITCHING   Items with * indicate a potential emergency and should be followed up as soon as possible or go to the Emergency Department if any problems should occur.  Please show the CHEMOTHERAPY ALERT CARD or IMMUNOTHERAPY ALERT CARD at  check-in to the Emergency Department and triage nurse.  Should you have questions after your visit or need to cancel or reschedule your appointment, please contact [facility]  Dept: [phone]  and follow the prompts.  Office hours are 8:00 a.m. to 4:30 p.m. Monday - Friday. Please note that voicemails left after 4:00 p.m. may not be returned until the following business day.  We are closed weekends and major holidays. You have access to a nurse at all times for urgent questions. Please call the main number to the clinic Dept: [phone] and follow the prompts.   For any non-urgent questions, you may also contact your provider using MyChart. We now offer e-Visits for anyone 36 and older to request care online for non-urgent symptoms. For details visit [email or web address].   Also download the MyChart app! Go to the app store, search "MyChart", open the app, select [facility], and log in with your MyChart username and password.  Due to Covid, a mask is required upon entering the hospital/clinic. If you do not have a mask, one will be given to you upon arrival. For doctor visits, patients may have 1 support person aged 53 or older with them. For treatment visits, patients cannot have anyone with them due to current Covid guidelines and our immunocompromised population.    Paclitaxel injection What is this medication? PACLITAXEL (PAK li TAX el) is a chemotherapy drug. It targets fast dividing cells, like cancer cells, and causes these cells to die. This medicine is used to treat ovarian cancer, breast cancer, lung cancer, Kaposi's sarcoma, and other cancers.  This medicine may be used for other purposes; ask your health care provider or pharmacist if you have questions. COMMON BRAND NAME(S): Onxol, Taxol What should I tell my care team before I take this medication? They need to know if you have any of these conditions: history of irregular heartbeat liver  disease low blood counts, like low white cell, platelet, or red cell counts lung or breathing disease, like asthma tingling of the fingers or toes, or other nerve disorder an unusual or allergic reaction to paclitaxel, alcohol, polyoxyethylated castor oil, other chemotherapy, other medicines, foods, dyes, or preservatives pregnant or trying to get pregnant breast-feeding How should I use this medication? This drug is given as an infusion into a vein. It is administered in a hospital or clinic by a specially trained health care professional. Talk to your pediatrician regarding the use of this medicine in children. Special care may be needed. Overdosage: If you think you have taken too much of this medicine contact a poison control center or emergency room at once. NOTE: This medicine is only for you. Do not share this medicine with others. What if I miss a dose? It is important not to miss your dose. Call your doctor or health care professional if you are unable to keep an appointment. What may interact with this medication? Do not take this medicine with any of the following medications: live virus vaccines This medicine may also interact with the following medications: antiviral medicines for hepatitis, HIV or AIDS certain antibiotics like erythromycin and clarithromycin certain medicines for fungal infections like ketoconazole and itraconazole certain medicines for seizures like carbamazepine, phenobarbital, phenytoin gemfibrozil nefazodone rifampin St. John's wort This list may not describe all possible interactions. Give your health care provider a list of all the medicines, herbs, non-prescription drugs, or dietary supplements you use. Also tell them if you smoke, drink alcohol, or use illegal drugs. Some items may interact with your medicine. What should I watch for while using this medication? Your condition will be monitored carefully while you are receiving this medicine. You  will need important blood work done while you are taking this medicine. This medicine can cause serious allergic reactions. To reduce your risk you will need to take other medicine(s) before treatment with this medicine. If you experience allergic reactions like skin rash, itching or hives, swelling of the face, lips, or tongue, tell your doctor or health care professional right away. In some cases, you may be given additional medicines to help with side effects. Follow all directions for their use. This drug may make you feel generally unwell. This is not uncommon, as chemotherapy can affect healthy cells as well as cancer cells. Report any side effects. Continue your course of treatment even though you feel ill unless your doctor tells you to stop. Call your doctor or health care professional for advice if you get a fever, chills or sore throat, or other symptoms of a cold or flu. Do not treat yourself. This drug decreases your body's ability to fight infections. Try to avoid being around people who are sick. This medicine may increase your risk to bruise or bleed. Call your doctor or health care professional if you notice any unusual bleeding. Be careful brushing and flossing your teeth or using a toothpick because you may get an infection or bleed more easily. If you have any dental work done, tell your dentist you are receiving this medicine. Avoid taking products that contain aspirin, acetaminophen, ibuprofen, naproxen, or ketoprofen unless   instructed by your doctor. These medicines may hide a fever. Do not become pregnant while taking this medicine. Women should inform their doctor if they wish to become pregnant or think they might be pregnant. There is a potential for serious side effects to an unborn child. Talk to your health care professional or pharmacist for more information. Do not breast-feed an infant while taking this medicine. Men are advised not to father a child while receiving this  medicine. This product may contain alcohol. Ask your pharmacist or healthcare provider if this medicine contains alcohol. Be sure to tell all healthcare providers you are taking this medicine. Certain medicines, like metronidazole and disulfiram, can cause an unpleasant reaction when taken with alcohol. The reaction includes flushing, headache, nausea, vomiting, sweating, and increased thirst. The reaction can last from 30 minutes to several hours. What side effects may I notice from receiving this medication? Side effects that you should report to your doctor or health care professional as soon as possible: allergic reactions like skin rash, itching or hives, swelling of the face, lips, or tongue breathing problems changes in vision fast, irregular heartbeat high or low blood pressure mouth sores pain, tingling, numbness in the hands or feet signs of decreased platelets or bleeding - bruising, pinpoint red spots on the skin, black, tarry stools, blood in the urine signs of decreased red blood cells - unusually weak or tired, feeling faint or lightheaded, falls signs of infection - fever or chills, cough, sore throat, pain or difficulty passing urine signs and symptoms of liver injury like dark yellow or brown urine; general ill feeling or flu-like symptoms; light-colored stools; loss of appetite; nausea; right upper belly pain; unusually weak or tired; yellowing of the eyes or skin swelling of the ankles, feet, hands unusually slow heartbeat Side effects that usually do not require medical attention (report to your doctor or health care professional if they continue or are bothersome): diarrhea hair loss loss of appetite muscle or joint pain nausea, vomiting pain, redness, or irritation at site where injected tiredness This list may not describe all possible side effects. Call your doctor for medical advice about side effects. You may report side effects to FDA at 1-800-FDA-1088. Where  should I keep my medication? This drug is given in a hospital or clinic and will not be stored at home. NOTE: This sheet is a summary. It may not cover all possible information. If you have questions about this medicine, talk to your doctor, pharmacist, or health care provider.  2022 Elsevier/Gold Standard (2019-05-09 13:37:23)

## 2021-03-25 ENCOUNTER — Encounter: Payer: Self-pay | Admitting: Cardiology

## 2021-03-25 ENCOUNTER — Ambulatory Visit (INDEPENDENT_AMBULATORY_CARE_PROVIDER_SITE_OTHER): Payer: Medicaid Other

## 2021-03-25 ENCOUNTER — Ambulatory Visit (INDEPENDENT_AMBULATORY_CARE_PROVIDER_SITE_OTHER): Payer: Medicaid Other | Admitting: Cardiology

## 2021-03-25 VITALS — BP 140/90 | HR 64 | Ht 71.0 in | Wt 219.4 lb

## 2021-03-25 DIAGNOSIS — R400 Somnolence: Secondary | ICD-10-CM | POA: Diagnosis not present

## 2021-03-25 DIAGNOSIS — R002 Palpitations: Secondary | ICD-10-CM

## 2021-03-25 DIAGNOSIS — R001 Bradycardia, unspecified: Secondary | ICD-10-CM

## 2021-03-25 DIAGNOSIS — R0683 Snoring: Secondary | ICD-10-CM

## 2021-03-25 DIAGNOSIS — E669 Obesity, unspecified: Secondary | ICD-10-CM | POA: Insufficient documentation

## 2021-03-25 LAB — TSH: TSH: 1.13 u[IU]/mL (ref 0.450–4.500)

## 2021-03-25 NOTE — Patient Instructions (Signed)
Medication Instructions:  Your physician recommends that you continue on your current medications as directed. Please refer to the Current Medication list given to you today.  *If you need a refill on your cardiac medications before your next appointment, please call your pharmacy*   Lab Work: Your physician recommends that you return for lab work in:  TODAY: T[facility] If you have labs (blood work) drawn today and your tests are completely normal, you will receive your results only by: [facility] (if you have MyChart) OR A paper copy in the mail If you have any lab test that is abnormal or we need to change your treatment, we will call you to review the results.   Testing/Procedures: Your physician has recommended that you have a sleep study. This test records several body functions during sleep, including: brain activity, eye movement, oxygen and carbon dioxide blood levels, heart rate and rhythm, breathing rate and rhythm, the flow of air through your mouth and nose, snoring, body muscle movements, and chest and belly movement.    Follow-Up: At [facility], you and your health needs are our priority.  As part of our continuing mission to provide you with exceptional heart care, we have created designated Provider Care Teams.  These Care Teams include your primary Cardiologist (physician) and Advanced Practice Providers (APPs -  Physician Assistants and Nurse Practitioners) who all work together to provide you with the care you need, when you need it.  We recommend signing up for the patient portal called "MyChart".  Sign up information is provided on this After Visit Summary.  MyChart is used to connect with patients for Virtual Visits (Telemedicine).  Patients are able to view lab/test results, encounter notes, upcoming appointments, etc.  Non-urgent messages can be sent to your provider as well.   To learn more about what you can do with MyChart, go to [email or web address].    Your  next appointment:   3 month(s)  The format for your next appointment:   In Person  Provider:   [name], DO [address], [place], [place] [address]    Other Instructions Sleep Study, Adult A sleep study (polysomnogram) is a series of tests done while you are sleeping. A sleep study records your brain waves, heart rate, breathing rate, oxygen level, and eye and leg movements. A sleep study helps your health care provider: See how well you sleep. Diagnose a sleep disorder. Determine how severe your sleep disorder is. Create a plan to treat your sleep disorder. Your health care provider may recommend a sleep study if you: Feel sleepy on most days. Snore loudly while sleeping. Have unusual behaviors while you sleep, such as walking. Have brief periods in which you stop breathing during sleep (sleepapnea). Fall asleep suddenly during the day (narcolepsy). Have trouble falling asleep or staying asleep (insomnia). Feel like you need to move your legs when trying to fall asleep (restless legs syndrome). Move your legs by flexing and extending them regularly while asleep (periodic limb movement disorder). Act out your dreams while you sleep (sleep behavior disorder). Feel like you cannot move when you first wake up (sleep paralysis). What tests are part of a sleep study? Most sleep studies record the following during sleep: Brain activity. Eye movements. Heart rate and rhythm. Breathing rate and rhythm. Blood-oxygen level. Blood pressure. Chest and belly movement as you breathe. Arm and leg movements. Snoring or other noises. Body position. Where are sleep studies done? Sleep studies are done at sleep centers.  A sleep center may be inside a hospital, office, or clinic. The room where you have the study may look like a hospital room or a hotel room. The health care providers doing the study may come in and out of the room during the study. Most of the time, they will be in  another room monitoring your test as you sleep. How are sleep studies done? Most sleep studies are done during a normal period of time for a full night of sleep. You will arrive at the study center in the evening and go home in the morning. Before the test Bring your pajamas and toothbrush with you to the sleep study. Do not have caffeine on the day of your sleep study. Do not drink alcohol on the day of your sleep study. Your health care provider will let you know if you should stop taking any of your regular medicines before the test. During the test   Round, sticky patches with sensors attached to recording wires (electrodes) are placed on your scalp, face, chest, and limbs. Wires from all the electrodes and sensors run from your bed to a computer. The wires can be taken off and put back on if you need to get out of bed to go to the bathroom. A sensor is placed over your nose to measure airflow. A finger clip is put on your finger or ear to measure your blood oxygen level (pulse oximetry). A belt is placed around your belly and a belt is placed around your chest to measure breathing movements. If you have signs of the sleep disorder called sleep apnea during your test, you may get a treatment mask to wear for the second half of the night. The mask provides positive airway pressure (PAP) to help you breathe better during sleep. This may greatly improve your sleep apnea. You will then have all tests done again with the mask in place to see if your measurements and recordings change. After the test A medical doctor who specializes in sleep will evaluate the results of your sleep study and share them with you and your primary health care provider. Based on your results, your medical history, and a physical exam, you may be diagnosed with a sleep disorder, such as: Sleep apnea. Restless legs syndrome. Sleep-related behavior disorder. Sleep-related movement disorders. Sleep-related seizure  disorders. Your health care team will help determine your treatment options based on your diagnosis. This may include: Improving your sleep habits (sleep hygiene). Wearing a continuous positive airway pressure (CPAP) or bi-level positive airway pressure (BPAP) mask. Wearing an oral device at night to improve breathing and reduce snoring. Taking medicines. Follow these instructions at home: Take over-the-counter and prescription medicines only as told by your health care provider. If you are instructed to use a CPAP or BPAP mask, make sure you use it nightly as directed. Make any lifestyle changes that your health care provider recommends. If you were given a device to open your airway while you sleep, use it only as told by your health care provider. Do not use any tobacco products, such as cigarettes, chewing tobacco, and e-cigarettes. If you need help quitting, ask your health care provider. Keep all follow-up visits as told by your health care provider. This is important. Summary A sleep study (polysomnogram) is a series of tests done while you are sleeping. It shows how well you sleep. Most sleep studies are done over one full night of sleep. You will arrive at the study center in  the evening and go home in the morning. If you have signs of the sleep disorder called sleep apnea during your test, you may get a treatment mask to wear for the second half of the night. A medical doctor who specializes in sleep will evaluate the results of your sleep study and share them with your primary health care provider. This information is not intended to replace advice given to you by your health care provider. Make sure you discuss any questions you have with your health care provider. Document Revised: 07/13/2019 Document Reviewed: 07/05/2017 Elsevier Patient Education  2022 [employer].

## 2021-03-25 NOTE — Progress Notes (Signed)
Cardiology Office Note:    Date:  03/25/2021   ID:  Chelsea Hansen, DOB 06-04-1971, MRN [id number]  PCP:  [name], NP  Cardiologist:  [name], DO  Electrophysiologist:  None   Referring MD: Wyvonnia Dusky, MD   " I to feel tired at times and also have some heart fluttering"  History of Present Illness:    Chelsea Hansen is a 50 y.o. female with a hx of bilateral breast cancer estrogen receptor positive status post right breast lumpectomy, DVT, GERD, hyperlipidemia, hypertension is here today to establish cardiac care.  The patient tells me during her hospitalization recently she was told that her heart rate was dropping and this was concerning for her care team.  During that time she had an echocardiogram which essentially did not show any structural abnormalities.  She was asked to follow-up with cardiology.  She tells me that she does not have any chest pain but does have some occasional dyspnea on exertion and some heart fluttering.  She has not had any lightheadedness or dizziness.  She expresses daytime somnolence and admits to snoring.  Past Medical History:  Diagnosis Date   Allergy    seasonal allergies   Anxiety    on meds   Asthma    uses inhaler   Breast cancer ([facility]) 2012   RIGHT lumpectomy   Cancer ([facility]) 2022   RIGHT breast lump-dx 2022   Depression    on meds   DVT (deep venous thrombosis) ([facility]) 2010   after hysterectomy   Family history of uterine cancer    GERD (gastroesophageal reflux disease)    with certain foods/OTC PRN meds   Headache(784.0)    Hyperlipidemia    on meds   Hypertension    on meds    Past Surgical History:  Procedure Laterality Date   ABDOMINAL HYSTERECTOMY     BREAST EXCISIONAL BIOPSY Right 09/16/2009   BREAST SURGERY     lumpectomy   PORTACATH PLACEMENT Left 01/26/2021   Procedure: INSERTION PORT-A-CATH;  Surgeon: [name], MD;  Location: [facility];  Service: General;  Laterality: Left;   TUBAL LIGATION       Current Medications: Current Meds  Medication Sig   loratadine (CLARITIN) 10 MG tablet Take 1 tablet by mouth daily.     Allergies:   Patient has no known allergies.   Social History   Socioeconomic History   Marital status: Single    Spouse name: Not on file   Number of children: Not on file   Years of education: Not on file   Highest education level: Not on file  Occupational History   Not on file  Tobacco Use   Smoking status: Never   Smokeless tobacco: Never  Vaping Use   Vaping Use: Never used  Substance and Sexual Activity   Alcohol use: Not Currently    Alcohol/week: 7.0 standard drinks    Types: 7 Standard drinks or equivalent per week    Comment: occassional   Drug use: No   Sexual activity: Not Currently  Other Topics Concern   Not on file  Social History Narrative   Not on file   Social Determinants of Health   Financial Resource Strain: High Risk   Difficulty of Paying Living Expenses: Hard  Food Insecurity: Food Insecurity Present   Worried About Running Out of Food in the Last Year: Sometimes true   Ran Out of Food in the Last Year: Sometimes true  Transportation Needs: No Data processing manager (Medical): No   Lack of Transportation (Non-Medical): No  Physical Activity: Not on file  Stress: Not on file  Social Connections: Not on file     Family History: The patient's family history includes Cancer in her maternal aunt, maternal aunt, and mother; Hypertension in her father; Uterine cancer (age of onset: 73) in her cousin. There is no history of Colon polyps, Colon cancer, Esophageal cancer, or Stomach cancer.  ROS:   Review of Systems  Constitution: Negative for decreased appetite, fever and weight gain.  HENT: Negative for congestion, ear discharge, hoarse voice and sore throat.   Eyes: Negative for discharge, redness, vision loss in right eye and visual halos.  Cardiovascular: Reports dyspnea on exertion and  palpitation.  Negative for chest pain, leg swelling, orthopnea. Respiratory: Negative for cough, hemoptysis, shortness of breath and snoring.   Endocrine: Negative for heat intolerance and polyphagia.  Hematologic/Lymphatic: Negative for bleeding problem. Does not bruise/bleed easily.  Skin: Negative for flushing, nail changes, rash and suspicious lesions.  Musculoskeletal: Negative for arthritis, joint pain, muscle cramps, myalgias, neck pain and stiffness.  Gastrointestinal: Negative for abdominal pain, bowel incontinence, diarrhea and excessive appetite.  Genitourinary: Negative for decreased libido, genital sores and incomplete emptying.  Neurological: Negative for brief paralysis, focal weakness, headaches and loss of balance.  Psychiatric/Behavioral: Negative for altered mental status, depression and suicidal ideas.  Allergic/Immunologic: Negative for HIV exposure and persistent infections.    EKGs/Labs/Other Studies Reviewed:    The following studies were reviewed today:   EKG:  The ekg ordered today demonstrates sinus rhythm.  Echocardiogram August 2022 IMPRESSIONS     1. Left ventricular ejection fraction, by estimation, is 60 to 65%. The  left ventricle has normal function. The left ventricle has no regional  wall motion abnormalities. Left ventricular diastolic parameters were  normal.   2. Right ventricular systolic function is normal. The right ventricular  size is normal.   3. Left atrial size was mildly dilated.   4. The mitral valve is normal in structure. Trivial mitral valve  regurgitation. No evidence of mitral stenosis.   5. The aortic valve was not well visualized. There is mild calcification  of the aortic valve. Aortic valve regurgitation is not visualized. No  aortic stenosis is present.   6. The inferior vena cava is normal in size with greater than 50%  respiratory variability, suggesting right atrial pressure of 3 mmHg.   FINDINGS   Left Ventricle:  Left ventricular ejection fraction, by estimation, is 60  to 65%. The left ventricle has normal function. The left ventricle has no  regional wall motion abnormalities. The left ventricular internal cavity  size was normal in size. There is   no left ventricular hypertrophy. Left ventricular diastolic parameters  were normal.   Right Ventricle: The right ventricular size is normal. No increase in  right ventricular wall thickness. Right ventricular systolic function is  normal.   Left Atrium: Left atrial size was mildly dilated.   Right Atrium: Right atrial size was normal in size.   Pericardium: There is no evidence of pericardial effusion.   Mitral Valve: The mitral valve is normal in structure. Trivial mitral  valve regurgitation. No evidence of mitral valve stenosis.   Tricuspid Valve: The tricuspid valve is normal in structure. Tricuspid  valve regurgitation is trivial. No evidence of tricuspid stenosis.   Aortic Valve: The aortic valve was not well visualized. There is mild  calcification of the aortic valve. Aortic valve regurgitation is not  visualized. No aortic stenosis is present.   Pulmonic Valve: The pulmonic valve was normal in structure. Pulmonic valve  regurgitation is not visualized. No evidence of pulmonic stenosis.   Aorta: The aortic root is normal in size and structure.   Venous: The inferior vena cava is normal in size with greater than 50%  respiratory variability, suggesting right atrial pressure of 3 mmHg.   IAS/Shunts: No atrial level shunt detected by color flow Doppler.       Recent Labs: 01/28/2021: Magnesium 2.0 03/24/2021: ALT 12; BUN 10; Creatinine, Ser 0.75; Hemoglobin 8.4; Platelets 337; Potassium 3.9; Sodium 142 03/25/2021: TSH 1.130  Recent Lipid Panel    Component Value Date/Time   CHOL 226 (H) 10/06/2020 1432   TRIG 196 (H) 10/06/2020 1432   HDL 51 10/06/2020 1432   CHOLHDL 4.4 10/06/2020 1432   CHOLHDL 3.1 Ratio 09/05/2009 2118    VLDL 29 09/05/2009 2118   LDLCALC 140 (H) 10/06/2020 1432    Physical Exam:    VS:  BP 140/90 (BP Location: Right Arm)   Pulse 64   Ht 5\' 11"  (1.803 m)   Wt 219 lb 6.4 oz (99.5 kg)   SpO2 96%   BMI 30.60 kg/m     Wt Readings from Last 3 Encounters:  03/25/21 219 lb 6.4 oz (99.5 kg)  03/24/21 220 lb 1.6 oz (99.8 kg)  03/10/21 216 lb 14.4 oz (98.4 kg)     GEN: Well nourished, well developed in no acute distress HEENT: Normal NECK: No JVD; No carotid bruits LYMPHATICS: No lymphadenopathy CARDIAC: S1S2 noted,RRR, no murmurs, rubs, gallops RESPIRATORY:  Clear to auscultation without rales, wheezing or rhonchi  ABDOMEN: Soft, non-tender, non-distended, +bowel sounds, no guarding. EXTREMITIES: No edema, No cyanosis, no clubbing MUSCULOSKELETAL:  No deformity  SKIN: Warm and dry NEUROLOGIC:  Alert and oriented x 3, non-focal PSYCHIATRIC:  Normal affect, good insight  ASSESSMENT:    1. Palpitations   2. Sinus bradycardia   3. Daytime somnolence   4. Snoring   5. Obesity (BMI 30-39.9)    PLAN:     I would like to rule out a cardiovascular etiology of this palpitation, therefore at this time I would like to placed a zio patch for  14  days.  I reviewed her echocardiogram which was done on January 23, 2019 does not show any structural abnormalities essentially normal study.  I am going to order a sleep study for this patient given her daytime somnolence and fatigue.  The patient understands the need to lose weight with diet and exercise. We have discussed specific strategies for this.  Once these testing have been performed amd reviewed further reccomendations will be made. For now, I do reccomend that the patient goes to the nearest ED if  symptoms recur.  The patient is in agreement with the above plan. The patient left the office in stable condition.  The patient will follow up in   Medication Adjustments/Labs and Tests Ordered: Current medicines are reviewed at length  with the patient today.  Concerns regarding medicines are outlined above.  Orders Placed This Encounter  Procedures   TSH   LONG TERM MONITOR (3-14 DAYS)   EKG 12-Lead   Split night study   No orders of the defined types were placed in this encounter.   Patient Instructions  Medication Instructions:  Your physician recommends that you continue on your current medications as directed. Please refer  to the Current Medication list given to you today.  *If you need a refill on your cardiac medications before your next appointment, please call your pharmacy*   Lab Work: Your physician recommends that you return for lab work in:  TODAY: TSH If you have labs (blood work) drawn today and your tests are completely normal, you will receive your results only by: [facility] (if you have MyChart) OR A paper copy in the mail If you have any lab test that is abnormal or we need to change your treatment, we will call you to review the results.   Testing/Procedures: Your physician has recommended that you have a sleep study. This test records several body functions during sleep, including: brain activity, eye movement, oxygen and carbon dioxide blood levels, heart rate and rhythm, breathing rate and rhythm, the flow of air through your mouth and nose, snoring, body muscle movements, and chest and belly movement.    Follow-Up: At [facility], you and your health needs are our priority.  As part of our continuing mission to provide you with exceptional heart care, we have created designated Provider Care Teams.  These Care Teams include your primary Cardiologist (physician) and Advanced Practice Providers (APPs -  Physician Assistants and Nurse Practitioners) who all work together to provide you with the care you need, when you need it.  We recommend signing up for the patient portal called "MyChart".  Sign up information is provided on this After Visit Summary.  MyChart is used to connect with  patients for Virtual Visits (Telemedicine).  Patients are able to view lab/test results, encounter notes, upcoming appointments, etc.  Non-urgent messages can be sent to your provider as well.   To learn more about what you can do with MyChart, go to [email or web address].    Your next appointment:   3 month(s)  The format for your next appointment:   In Person  Provider:   [name], DO [address], [place], [place] [address]    Other Instructions Sleep Study, Adult A sleep study (polysomnogram) is a series of tests done while you are sleeping. A sleep study records your brain waves, heart rate, breathing rate, oxygen level, and eye and leg movements. A sleep study helps your health care provider: See how well you sleep. Diagnose a sleep disorder. Determine how severe your sleep disorder is. Create a plan to treat your sleep disorder. Your health care provider may recommend a sleep study if you: Feel sleepy on most days. Snore loudly while sleeping. Have unusual behaviors while you sleep, Hansen as walking. Have brief periods in which you stop breathing during sleep (sleepapnea). Fall asleep suddenly during the day (narcolepsy). Have trouble falling asleep or staying asleep (insomnia). Feel like you need to move your legs when trying to fall asleep (restless legs syndrome). Move your legs by flexing and extending them regularly while asleep (periodic limb movement disorder). Act out your dreams while you sleep (sleep behavior disorder). Feel like you cannot move when you first wake up (sleep paralysis). What tests are part of a sleep study? Most sleep studies record the following during sleep: Brain activity. Eye movements. Heart rate and rhythm. Breathing rate and rhythm. Blood-oxygen level. Blood pressure. Chest and belly movement as you breathe. Arm and leg movements. Snoring or other noises. Body position. Where are sleep studies done? Sleep studies are  done at sleep centers. A sleep center may be inside a hospital, office, or clinic. The room where you have  the study may look like a hospital room or a hotel room. The health care providers doing the study may come in and out of the room during the study. Most of the time, they will be in another room monitoring your test as you sleep. How are sleep studies done? Most sleep studies are done during a normal period of time for a full night of sleep. You will arrive at the study center in the evening and go home in the morning. Before the test Bring your pajamas and toothbrush with you to the sleep study. Do not have caffeine on the day of your sleep study. Do not drink alcohol on the day of your sleep study. Your health care provider will let you know if you should stop taking any of your regular medicines before the test. During the test   Round, sticky patches with sensors attached to recording wires (electrodes) are placed on your scalp, face, chest, and limbs. Wires from all the electrodes and sensors run from your bed to a computer. The wires can be taken off and put back on if you need to get out of bed to go to the bathroom. A sensor is placed over your nose to measure airflow. A finger clip is put on your finger or ear to measure your blood oxygen level (pulse oximetry). A belt is placed around your belly and a belt is placed around your chest to measure breathing movements. If you have signs of the sleep disorder called sleep apnea during your test, you may get a treatment mask to wear for the second half of the night. The mask provides positive airway pressure (PAP) to help you breathe better during sleep. This may greatly improve your sleep apnea. You will then have all tests done again with the mask in place to see if your measurements and recordings change. After the test A medical doctor who specializes in sleep will evaluate the results of your sleep study and share them with you and  your primary health care provider. Based on your results, your medical history, and a physical exam, you may be diagnosed with a sleep disorder, Hansen as: Sleep apnea. Restless legs syndrome. Sleep-related behavior disorder. Sleep-related movement disorders. Sleep-related seizure disorders. Your health care team will help determine your treatment options based on your diagnosis. This may include: Improving your sleep habits (sleep hygiene). Wearing a continuous positive airway pressure (CPAP) or bi-level positive airway pressure (BPAP) mask. Wearing an oral device at night to improve breathing and reduce snoring. Taking medicines. Follow these instructions at home: Take over-the-counter and prescription medicines only as told by your health care provider. If you are instructed to use a CPAP or BPAP mask, make sure you use it nightly as directed. Make any lifestyle changes that your health care provider recommends. If you were given a device to open your airway while you sleep, use it only as told by your health care provider. Do not use any tobacco products, Hansen as cigarettes, chewing tobacco, and e-cigarettes. If you need help quitting, ask your health care provider. Keep all follow-up visits as told by your health care provider. This is important. Summary A sleep study (polysomnogram) is a series of tests done while you are sleeping. It shows how well you sleep. Most sleep studies are done over one full night of sleep. You will arrive at the study center in the evening and go home in the morning. If you have signs of the sleep disorder called  sleep apnea during your test, you may get a treatment mask to wear for the second half of the night. A medical doctor who specializes in sleep will evaluate the results of your sleep study and share them with your primary health care provider. This information is not intended to replace advice given to you by your health care provider. Make sure you  discuss any questions you have with your health care provider. Document Revised: 07/13/2019 Document Reviewed: 07/05/2017 Elsevier Patient Education  2022 [employer].     Adopting a Healthy Lifestyle.  Know what a healthy weight is for you (roughly BMI <25) and aim to maintain this   Aim for 7+ servings of fruits and vegetables daily   65-80+ fluid ounces of water or unsweet tea for healthy kidneys   Limit to max 1 drink of alcohol per day; avoid smoking/tobacco   Limit animal fats in diet for cholesterol and heart health - choose grass fed whenever available   Avoid highly processed foods, and foods high in saturated/trans fats   Aim for low stress - take time to unwind and care for your mental health   Aim for 150 min of moderate intensity exercise weekly for heart health, and weights twice weekly for bone health   Aim for 7-9 hours of sleep daily   When it comes to diets, agreement about the perfect plan isnt easy to find, even among the experts. Experts at the [employer] developed an idea known as the Healthy Eating Plate. Just imagine a plate divided into logical, healthy portions.   The emphasis is on diet quality:   Load up on vegetables and fruits - one-half of your plate: Aim for color and variety, and remember that potatoes dont count.   Go for whole grains - one-quarter of your plate: Whole wheat, barley, wheat berries, quinoa, oats, brown rice, and foods made with them. If you want pasta, go with whole wheat pasta.   Protein power - one-quarter of your plate: Fish, chicken, beans, and nuts are all healthy, versatile protein sources. Limit red meat.   The diet, however, does go beyond the plate, offering a few other suggestions.   Use healthy plant oils, Hansen as olive, canola, soy, corn, sunflower and peanut. Check the labels, and avoid partially hydrogenated oil, which have unhealthy trans fats.   If youre thirsty, drink water. Coffee and  tea are good in moderation, but skip sugary drinks and limit milk and dairy products to one or two daily servings.   The type of carbohydrate in the diet is more important than the amount. Some sources of carbohydrates, Hansen as vegetables, fruits, whole grains, and beans-are healthier than others.   Finally, stay active  Signed, [name], DO  03/25/2021 9:35 PM    [facility]

## 2021-03-25 NOTE — Progress Notes (Unsigned)
Patient enrolled for [employer] to mail a 14 day ZIO XT monitor to her address on file.

## 2021-03-26 ENCOUNTER — Telehealth: Payer: Self-pay | Admitting: *Deleted

## 2021-03-26 ENCOUNTER — Institutional Professional Consult (permissible substitution) (INDEPENDENT_AMBULATORY_CARE_PROVIDER_SITE_OTHER): Payer: Medicaid Other | Admitting: Clinical

## 2021-03-26 NOTE — Telephone Encounter (Signed)
Prior Authorization for split night sleep study sent to [facility] via web portal. Tracking Number [id number].  Reference #  [id number].

## 2021-03-26 NOTE — Telephone Encounter (Signed)
-----   Message from Orvan July, RN sent at 03/25/2021 10:12 AM EDT ----- Needs precert for sleep study.  Thank you,    [facility]

## 2021-03-28 DIAGNOSIS — R002 Palpitations: Secondary | ICD-10-CM

## 2021-03-30 NOTE — Telephone Encounter (Signed)
Appt scheduled for 04/02/21

## 2021-03-31 ENCOUNTER — Inpatient Hospital Stay: Payer: Medicaid Other

## 2021-03-31 ENCOUNTER — Encounter: Payer: Self-pay | Admitting: Adult Health

## 2021-03-31 ENCOUNTER — Inpatient Hospital Stay (HOSPITAL_BASED_OUTPATIENT_CLINIC_OR_DEPARTMENT_OTHER): Payer: Medicaid Other | Admitting: Adult Health

## 2021-03-31 ENCOUNTER — Other Ambulatory Visit: Payer: Self-pay

## 2021-03-31 VITALS — BP 136/94 | HR 76 | Temp 97.6°F | Resp 16 | Ht 71.0 in | Wt 219.5 lb

## 2021-03-31 DIAGNOSIS — C50411 Malignant neoplasm of upper-outer quadrant of right female breast: Secondary | ICD-10-CM

## 2021-03-31 DIAGNOSIS — Z170 Estrogen receptor positive status [ER+]: Secondary | ICD-10-CM | POA: Diagnosis not present

## 2021-03-31 DIAGNOSIS — C50412 Malignant neoplasm of upper-outer quadrant of left female breast: Secondary | ICD-10-CM

## 2021-03-31 DIAGNOSIS — Z95828 Presence of other vascular implants and grafts: Secondary | ICD-10-CM

## 2021-03-31 DIAGNOSIS — Z5111 Encounter for antineoplastic chemotherapy: Secondary | ICD-10-CM | POA: Diagnosis not present

## 2021-03-31 LAB — CBC WITH DIFFERENTIAL/PLATELET
Abs Immature Granulocytes: 0.09 10*3/uL — ABNORMAL HIGH (ref 0.00–0.07)
Basophils Absolute: 0.1 10*3/uL (ref 0.0–0.1)
Basophils Relative: 1 %
Eosinophils Absolute: 0.1 10*3/uL (ref 0.0–0.5)
Eosinophils Relative: 1 %
HCT: 25.6 % — ABNORMAL LOW (ref 36.0–46.0)
Hemoglobin: 8 g/dL — ABNORMAL LOW (ref 12.0–15.0)
Immature Granulocytes: 1 %
Lymphocytes Relative: 21 %
Lymphs Abs: 1.4 10*3/uL (ref 0.7–4.0)
MCH: 23.6 pg — ABNORMAL LOW (ref 26.0–34.0)
MCHC: 31.3 g/dL (ref 30.0–36.0)
MCV: 75.5 fL — ABNORMAL LOW (ref 80.0–100.0)
Monocytes Absolute: 0.9 10*3/uL (ref 0.1–1.0)
Monocytes Relative: 14 %
Neutro Abs: 3.9 10*3/uL (ref 1.7–7.7)
Neutrophils Relative %: 62 %
Platelets: 383 10*3/uL (ref 150–400)
RBC: 3.39 MIL/uL — ABNORMAL LOW (ref 3.87–5.11)
RDW: 20.6 % — ABNORMAL HIGH (ref 11.5–15.5)
WBC: 6.3 10*3/uL (ref 4.0–10.5)
nRBC: 0 % (ref 0.0–0.2)

## 2021-03-31 LAB — COMPREHENSIVE METABOLIC PANEL
ALT: 21 U/L (ref 0–44)
AST: 16 U/L (ref 15–41)
Albumin: 3.6 g/dL (ref 3.5–5.0)
Alkaline Phosphatase: 91 U/L (ref 38–126)
Anion gap: 10 (ref 5–15)
BUN: 10 mg/dL (ref 6–20)
CO2: 24 mmol/L (ref 22–32)
Calcium: 9.4 mg/dL (ref 8.9–10.3)
Chloride: 108 mmol/L (ref 98–111)
Creatinine, Ser: 0.76 mg/dL (ref 0.44–1.00)
GFR, Estimated: >60 mL/min (ref >60)
Glucose, Bld: 96 mg/dL (ref 70–99)
Potassium: 3.7 mmol/L (ref 3.5–5.1)
Sodium: 142 mmol/L (ref 135–145)
Total Bilirubin: <0.2 mg/dL — ABNORMAL LOW (ref 0.3–1.2)
Total Protein: 6.6 g/dL (ref 6.5–8.1)

## 2021-03-31 MED ORDER — DEXAMETHASONE SODIUM PHOSPHATE 10 MG/ML IJ SOLN
4.0000 mg | Freq: Once | INTRAMUSCULAR | Status: AC
Start: 2021-03-31 — End: 2021-03-31
  Administered 2021-03-31: 4 mg via INTRAVENOUS
  Filled 2021-03-31: qty 1

## 2021-03-31 MED ORDER — SODIUM CHLORIDE 0.9% FLUSH
10.0000 mL | Freq: Once | INTRAVENOUS | Status: AC
  Administered 2021-03-31: 10 mL

## 2021-03-31 MED ORDER — SODIUM CHLORIDE 0.9% FLUSH
10.0000 mL | INTRAVENOUS | Status: DC | PRN
  Administered 2021-03-31: 10 mL

## 2021-03-31 MED ORDER — FAMOTIDINE 20 MG IN NS 100 ML IVPB
20.0000 mg | Freq: Once | INTRAVENOUS | Status: AC
  Administered 2021-03-31: 20 mg via INTRAVENOUS
  Filled 2021-03-31: qty 100

## 2021-03-31 MED ORDER — SODIUM CHLORIDE 0.9 % IV SOLN: Freq: Once | INTRAVENOUS | Status: AC

## 2021-03-31 MED ORDER — HEPARIN SOD (PORK) LOCK FLUSH 100 UNIT/ML IV SOLN
500.0000 [IU] | Freq: Once | INTRAVENOUS | Status: AC | PRN
  Administered 2021-03-31: 500 [IU]

## 2021-03-31 MED ORDER — DIPHENHYDRAMINE HCL 50 MG/ML IJ SOLN
25.0000 mg | Freq: Once | INTRAMUSCULAR | Status: AC
  Administered 2021-03-31: 25 mg via INTRAVENOUS
  Filled 2021-03-31: qty 1

## 2021-03-31 MED ORDER — SODIUM CHLORIDE 0.9 % IV SOLN
80.0000 mg/m2 | Freq: Once | INTRAVENOUS | Status: AC
  Administered 2021-03-31: 180 mg via INTRAVENOUS
  Filled 2021-03-31: qty 30

## 2021-03-31 NOTE — Patient Instructions (Signed)
[facility]  Discharge Instructions: Thank you for choosing [facility] to provide your oncology and hematology care.   If you have a lab appointment with the [facility], please go directly to the [facility] and check in at the registration area.   Wear comfortable clothing and clothing appropriate for easy access to any Portacath or PICC line.   We strive to give you quality time with your provider. You may need to reschedule your appointment if you arrive late (15 or more minutes).  Arriving late affects you and other patients whose appointments are after yours.  Also, if you miss three or more appointments without notifying the office, you may be dismissed from the clinic at the provider's discretion.      For prescription refill requests, have your pharmacy contact our office and allow 72 hours for refills to be completed.    Today you received the following chemotherapy and/or immunotherapy agents Paclitaxel (Taxol)      To help prevent nausea and vomiting after your treatment, we encourage you to take your nausea medication as directed.  BELOW ARE SYMPTOMS THAT SHOULD BE REPORTED IMMEDIATELY: *FEVER GREATER THAN 100.4 F (38 C) OR HIGHER *CHILLS OR SWEATING *NAUSEA AND VOMITING THAT IS NOT CONTROLLED WITH YOUR NAUSEA MEDICATION *UNUSUAL SHORTNESS OF BREATH *UNUSUAL BRUISING OR BLEEDING *URINARY PROBLEMS (pain or burning when urinating, or frequent urination) *BOWEL PROBLEMS (unusual diarrhea, constipation, pain near the anus) TENDERNESS IN MOUTH AND THROAT WITH OR WITHOUT PRESENCE OF ULCERS (sore throat, sores in mouth, or a toothache) UNUSUAL RASH, SWELLING OR PAIN  UNUSUAL VAGINAL DISCHARGE OR ITCHING   Items with * indicate a potential emergency and should be followed up as soon as possible or go to the Emergency Department if any problems should occur.  Please show the CHEMOTHERAPY ALERT CARD or IMMUNOTHERAPY ALERT CARD at  check-in to the Emergency Department and triage nurse.  Should you have questions after your visit or need to cancel or reschedule your appointment, please contact [facility]  Dept: [phone]  and follow the prompts.  Office hours are 8:00 a.m. to 4:30 p.m. Monday - Friday. Please note that voicemails left after 4:00 p.m. may not be returned until the following business day.  We are closed weekends and major holidays. You have access to a nurse at all times for urgent questions. Please call the main number to the clinic Dept: [phone] and follow the prompts.   For any non-urgent questions, you may also contact your provider using MyChart. We now offer e-Visits for anyone 49 and older to request care online for non-urgent symptoms. For details visit [email or web address].   Also download the MyChart app! Go to the app store, search "MyChart", open the app, select [facility], and log in with your MyChart username and password.  Due to Covid, a mask is required upon entering the hospital/clinic. If you do not have a mask, one will be given to you upon arrival. For doctor visits, patients may have 1 support person aged 49 or older with them. For treatment visits, patients cannot have anyone with them due to current Covid guidelines and our immunocompromised population.

## 2021-03-31 NOTE — Progress Notes (Signed)
Pt tolerated treatment well. VSS and labs WDL. All questions answered.

## 2021-03-31 NOTE — Progress Notes (Signed)
[facility]  Telephone:([phone]) [phone] Fax:(336) [phone]     ID: ZAHLI VETSCH DOB: 03-19-1971  MR#: [id number]  TGP#:[id number]  Patient Care Team: [name], NP as PCP - General (Internal Medicine) [name], RN as Oncology Nurse Navigator [name], RN as Oncology Nurse Navigator [name], MD as Consulting Physician (Radiation Oncology) Magrinat, Virgie Dad, MD as Consulting Physician (Oncology) [name], MD as Consulting Physician (General Surgery) Scot Dock, NP OTHER MD:  CHIEF COMPLAINT: Bilateral estrogen receptor positive breast cancer  CURRENT TREATMENT:  neoadjuvant chemotherapy   INTERVAL HISTORY: Chelsea Hansen returns today for follow up and treatment of her bilateral breast cancer. She has completed doxorubicin and cyclophosphamide x 4 and is here today for week 2 of 12 weekly paclitaxel.  She tolerated her first cycle quite well and has had no issues.  She notes some continued hyperpigmentation of the palms of her hands and darkening of her nail beds.  She has not had any nausea, vomiting, or peripheral neuropathy.    REVIEW OF SYSTEMS: Review of Systems  Constitutional:  Positive for fatigue. Negative for appetite change, chills, fever and unexpected weight change.  HENT:   Negative for hearing loss, lump/mass and trouble swallowing.   Eyes:  Negative for eye problems and icterus.  Respiratory:  Negative for chest tightness, cough and shortness of breath.   Cardiovascular:  Negative for chest pain, leg swelling and palpitations.  Gastrointestinal:  Negative for abdominal distention, abdominal pain, constipation, diarrhea, nausea and vomiting.  Endocrine: Negative for hot flashes.  Genitourinary:  Negative for difficulty urinating.   Musculoskeletal:  Negative for arthralgias.  Skin:  Negative for itching and rash.  Neurological:  Negative for dizziness, extremity weakness, headaches and numbness.  Hematological:   Negative for adenopathy. Does not bruise/bleed easily.  Psychiatric/Behavioral:  Negative for depression. The patient is not nervous/anxious.      COVID 19 VACCINATION STATUS: Chelsea Hansen x2, most recently 10/2020, no booster as of June 2022   HISTORY OF CURRENT ILLNESS: From the original intake note:  Chelsea Hansen has a history of excisional biopsy of a fibroadenoma from the lower outer right breast in 2011.  More recently she noted a knot in her right breast.  She brought it to medical attention and proceeded to bilateral diagnostic mammography with tomography and bilateral breast ultrasonography at [facility] on 11/18/2020 showing: breast density category B;   RIGHT: 3.2 cm mass associated with architectural distortion and scattered calcifications in upper-outer right breast at 10 o'clock, corresponding to palpable concern; indeterminate 1.1 cm mass in upper-outer right breast at 9:30; solitary pathologic right axillary lymph node with eccentric cortical thickening up to 7 mm. LEFT: 0.8 cm mass involving upper-outer left breast at 2 o'clock; no pathologic left axillary lymphadenopathy.  Accordingly on 12/03/2020 she proceeded to biopsy of the bilateral breast areas in question. The pathology from this procedure ([id number]) showed:  Right Breast, 9:30 fibroadenoma Right Breast, 10 o'clock Invasive and in situ mammary carcinoma, e-cadherin negative, grade 2 Prognostic indicators significant for: estrogen receptor, 95% positive and progesterone receptor, 100% positive, both with strong staining intensity. Proliferation marker Ki67 at 40%. HER2 negative by immunohistochemistry (0). 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 Grade or-2  Prognostic indicators significant for: estrogen receptor, 95% positive and progesterone  receptor, 100% positive, both  with strong staining intensity. Proliferation marker Ki67 at 15%. HER2 negative by immunohistochemistry (1+).  Cancer Staging Malignant neoplasm of upper-outer quadrant of left breast in female, estrogen receptor positive (Chelsea Hansen) Staging form: Breast, AJCC 8th Edition - Clinical stage from 12/17/2020: cT1b, cN0, cM0, ER+, PR+, HER2- - Signed by [name], MD on 12/17/2020 Stage prefix: Initial diagnosis Laterality: Left Staged by: Pathologist and managing physician Stage used in treatment planning: Yes National guidelines used in treatment planning: Yes Type of national guideline used in treatment planning: NCCN  Malignant neoplasm of upper-outer quadrant of right breast in female, estrogen receptor positive (Chelsea Hansen) Staging form: Breast, AJCC 8th Edition - Clinical stage from 12/17/2020: cT2, cN1, cM0, ER+, PR+, HER2- - Signed by [name], MD on 12/17/2020 Stage prefix: Initial diagnosis Laterality: Right Staged by: Pathologist and managing physician Stage used in treatment planning: Yes National guidelines used in treatment planning: Yes Type of national guideline used in treatment planning: NCCN  The patient's subsequent history is as detailed below.   PAST MEDICAL HISTORY: Past Medical History:  Diagnosis Date   Allergy    seasonal allergies   Anxiety    on meds   Asthma    uses inhaler   Breast cancer ([facility]) 2012   RIGHT lumpectomy   Cancer (Chelsea Hansen) 2022   RIGHT breast lump-dx 2022   Depression    on meds   DVT (deep venous thrombosis) ([facility]) 2010   after hysterectomy   Family history of uterine cancer    GERD (gastroesophageal reflux disease)    with certain foods/OTC PRN meds   Headache(784.0)    Hyperlipidemia    on meds   Hypertension    on meds    PAST SURGICAL HISTORY: Past Surgical History:  Procedure Laterality Date   ABDOMINAL HYSTERECTOMY     BREAST EXCISIONAL BIOPSY Right 09/16/2009   BREAST SURGERY      lumpectomy   PORTACATH PLACEMENT Left 01/26/2021   Procedure: INSERTION PORT-A-CATH;  Surgeon: [name], MD;  Location: [facility];  Service: General;  Laterality: Left;   TUBAL LIGATION    Patient's ovaries are still in place, status post hysterectomy 08/18/2010 with benign pathology   FAMILY HISTORY: Family History  Problem Relation Age of Onset   Cancer Mother        ?, dx <50   Hypertension Father    Cancer Maternal Aunt        uterine vs ovarian, dx <50   Cancer Maternal Aunt        unknown type, dx <50   Uterine cancer Cousin 27   Colon polyps Neg Hx    Colon cancer Neg Hx    Esophageal cancer Neg Hx    Stomach cancer Neg Hx    Her father is age 61 and her mother age 32 as of 11/2020. Her mother has a history of cancer (type unsure).  The patient's mother had two sisters also with cancer, one with uterine and the other cancer type is unknown to the patient. Mishel has 1 brother and 1 sister.    GYNECOLOGIC HISTORY:  No LMP recorded. Patient has had a hysterectomy. Menarche: 50 years old Age at first live birth: 50 years old [place] P 5 LMP 2012, with hysterectomy Contraceptive never used HRT never used  Hysterectomy? Yes, 07/2010 for fibroids (pathology in Epic) BSO? 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 She is single and lives at home  with her three sons. Her two daughters also live here in [place]. Daughter Krishauna Schatzman, age 55, works at [employer]. Daughter Jilda Panda Sample, age 30, and son Isiah Nils Pyle, age 65, work at [employer]. Son Sharlee Blew, age 2, and daughter Alvera Novel, age 40, are currently not employed. Frimy is not a Designer, fashion/clothing.    ADVANCED DIRECTIVES: not in place   HEALTH MAINTENANCE: Social History   Tobacco Use   Smoking status: Never   Smokeless tobacco: Never  Vaping Use   Vaping Use: Never used  Substance Use Topics   Alcohol use: Not Currently    Alcohol/week: 7.0 standard  drinks    Types: 7 Standard drinks or equivalent per week    Comment: occassional   Drug use: No     Colonoscopy: 10/2020 (Dr. Fuller Plan), recall 2027  PAP: 05/2010, negative, repeat not indicated (s/p hysterectomy)  Bone density: n/a (age)   No Known Allergies  Current Outpatient Medications  Medication Sig Dispense Refill   acetaminophen (TYLENOL) 500 MG tablet Take 1 tablet by mouth 3 times daily as needed. Take with Aleve 220 mg 90 tablet 0   albuterol (VENTOLIN HFA) 108 (90 Base) MCG/ACT inhaler Inhale 2 puffs into the lungs every 6 (six) hours as needed for wheezing or shortness of breath. 18 g 2   amLODipine (NORVASC) 10 MG tablet Take 1 tablet (10 mg total) by mouth daily. 90 tablet 1   Cholecalciferol (VITAMIN D3) 2000 units capsule Take 2,000 Units by mouth daily.  11   escitalopram (LEXAPRO) 20 MG tablet Take 1 tablet (20 mg total) by mouth daily. 90 tablet 1   fluconazole (DIFLUCAN) 100 MG tablet Take 1 tablet by mouth at bedtime. Take days 1 through 5 after each  chemotherapy dose 20 tablet 0   fluticasone (FLONASE) 50 MCG/ACT nasal spray Use 2 sprays in each nostril daily. 16 g 11   hydrochlorothiazide (HYDRODIURIL) 25 MG tablet Take 1 tablet (25 mg total) by mouth daily. 90 tablet 1   lidocaine-prilocaine (EMLA) cream Apply to affected area once as directed 30 g 3   montelukast (SINGULAIR) 10 MG tablet Take 1 tablet (10 mg total) by mouth at bedtime. 90 tablet 3   Multiple Vitamin (MULTIVITAMIN) tablet Take 2 tablets by mouth daily.     omeprazole (PRILOSEC) 40 MG capsule Take 1 capsule by mouth at bedtime. Take days 1 through 5 after each chemotherapy dose 60 capsule 0   ondansetron (ZOFRAN) 8 MG tablet Take 1 tablet by mouth every 8 hours as needed for nausea or vomiting. Start day 3 after chemotherapy. 20 tablet 0   prochlorperazine (COMPAZINE) 10 MG tablet Take one tablet before meals days 2 and 3 after each chemotherapy cycle. 30 tablet 1   promethazine (PHENERGAN) 25 MG  suppository Unwrap and insert 1 suppository (25 mg total) rectally every 8 (eight) hours as needed for up to 6 doses for nausea or vomiting. 6 each 0   No current facility-administered medications for this visit.   Facility-Administered Medications Ordered in Other Visits  Medication Dose Route Frequency Provider Last Rate Last Admin   acetaminophen (TYLENOL) 500 MG tablet            ondansetron (ZOFRAN) 4 MG/2ML injection            ondansetron (ZOFRAN) 4 MG/2ML injection            sodium chloride flush (NS) 0.9 % injection 10 mL  10 mL Intracatheter PRN Magrinat, Virgie Dad, MD  10 mL at 02/10/21 1716    OBJECTIVE: African-American woman who appears stated age  9:   03/24/21 0810  BP: 138/90  Pulse: 70  Temp: 97.7 F (36.5 C)  SpO2: 100%        Body mass index is 30.7 kg/m.   Wt Readings from Last 3 Encounters:  03/24/21 220 lb 1.6 oz (99.8 kg)  03/10/21 216 lb 14.4 oz (98.4 kg)  02/25/21 216 lb 8 oz (98.2 kg)     ECOG FS:1 - Symptomatic but completely ambulatory GENERAL: Patient is a well appearing female in no acute distress HEENT:  Sclerae anicteric.  Oropharynx clear and moist. No ulcerations or evidence of oropharyngeal candidiasis. Neck is supple.  NODES:  No cervical, supraclavicular, or axillary lymphadenopathy palpated.  BREAST EXAM:  No progression of her breast mass noted LUNGS:  Clear to auscultation bilaterally.  No wheezes or rhonchi. HEART:  Regular rate and rhythm. No murmur appreciated. ABDOMEN:  Soft, nontender.  Positive, normoactive bowel sounds. No organomegaly palpated. MSK:  No focal spinal tenderness to palpation. Full range of motion bilaterally in the upper extremities. EXTREMITIES:  No peripheral edema.   SKIN:  hyperpigmentation of hands, and darkening at base of fingernails.   NEURO:  Nonfocal. Well oriented.  Appropriate affect.    LAB RESULTS:  CMP     Component Value Date/Time   NA 140 03/10/2021 0836   NA 139 10/06/2020 1432    K 3.4 (L) 03/10/2021 0836   CL 105 03/10/2021 0836   CO2 25 03/10/2021 0836   GLUCOSE 129 (H) 03/10/2021 0836   BUN 12 03/10/2021 0836   BUN 11 10/06/2020 1432   CREATININE 0.88 03/10/2021 0836   CREATININE 0.70 03/02/2021 1533   CALCIUM 9.2 03/10/2021 0836   PROT 6.9 03/10/2021 0836   PROT 6.9 10/06/2020 1432   ALBUMIN 3.7 03/10/2021 0836   ALBUMIN 4.1 10/06/2020 1432   AST 14 (L) 03/10/2021 0836   AST 14 (L) 03/02/2021 1533   ALT 18 03/10/2021 0836   ALT 14 03/02/2021 1533   ALKPHOS 138 (H) 03/10/2021 0836   BILITOT 0.2 (L) 03/10/2021 0836   BILITOT 0.3 03/02/2021 1533   GFRNONAA >60 03/10/2021 0836   GFRNONAA >60 03/02/2021 1533   GFRAA >60 09/07/2019 1340    Lab Results  Component Value Date   WBC 12.9 (H) 03/24/2021   NEUTROABS 8.2 (H) 03/24/2021   HGB 8.4 (L) 03/24/2021   HCT 26.8 (L) 03/24/2021   MCV 75.3 (L) 03/24/2021   PLT 337 03/24/2021     Lab Results  Component Value Date   CA2729 66.9 (H) 02/10/2021       ELIGIBLE FOR AVAILABLE RESEARCH PROTOCOL: no  ASSESSMENT: 50 y.o. [place] woman status post bilateral breast biopsies 12/03/2020, showing  (1) on the right, a clinical T2 N1, stage IIa invasive lobular carcinoma, grade 2, estrogen and progesterone receptor strongly positive, HER2 not amplified, with an MIB-1 of 40%   (a) the biopsied right axillary lymph node was positive with extracapsular extension   (b) a second right breast mass also biopsied was a fibroadenoma, concordant   (c) biopsy of an area of non-mass-like enhancement in the upper right breast pending   (2) on the left, a clinical T1b N0, stage IA invasive ductal carcinoma, grade 1 or 2, estrogen and progesterone receptor positive, HER2 not amplified, with an MIB 1 of 15%   (a) biopsy of a 0.4 cm enhancement in the medial left breast pending  (3)  genetics testing through the [facility] BRCAplus panel (report date 12/24/2020) or the CancerNext-Expanded + RNAinsight panel (report date  12/31/2020) found no deleterious mutations in ATM, BRCA1, BRCA2, CDH1, CHEK2, PALB2, PTEN, and TP53. The CancerNext-Expanded + RNAinsight gene panel offered by [employer] and includes sequencing and rearrangement analysis for the following 77 genes: AIP, ALK, APC, ATM, AXIN2, BAP1, BARD1, BLM, BMPR1A, BRCA1, BRCA2, BRIP1, CDC73, CDH1, CDK4, CDKN1B, CDKN2A, CHEK2, CTNNA1, DICER1, FANCC, FH, FLCN, GALNT12, KIF1B, LZTR1, MAX, MEN1, MET, MLH1, MSH2, MSH3, MSH6, MUTYH, NBN, NF1, NF2, NTHL1, PALB2, PHOX2B, PMS2, POT1, PRKAR1A, PTCH1, PTEN, RAD51C, RAD51D, RB1, RECQL, RET, SDHA, SDHAF2, SDHB, SDHC, SDHD, SMAD4, SMARCA4, SMARCB1, SMARCE1, STK11, SUFU, TMEM127, TP53, TSC1, TSC2, VHL and XRCC2 (sequencing and deletion/duplication); EGFR, EGLN1, HOXB13, KIT, MITF, PDGFRA, POLD1 and POLE (sequencing only); EPCAM and GREM1 (deletion/duplication only). RNA data is routinely analyzed for use in variant interpretation for all genes.  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  (5) definitive surgery to follow chemotherapy  (6) chemotherapy consisting of doxorubicin and cyclophosphamide in dose dense fashion x4 started 01/27/2021 to be followed by weekly paclitaxel x12.  (7) adjuvant radiation on the right, possibly also on the left  (8) antiestrogens to follow at the completion of local treatment  (9) oral mass noted at time of intubation for port placement:  (A) CT head and neck 02/02/2021 negative  (B) ENT evaluation Dr. Kandis Cocking 02/06/2021 benign note from her February 06 2021 visit notes mild mucositis, thrush, and reflux.  When we see Eric again we will start her on a PPI and Diflucan for thrush   PLAN: Miriam is here today for f/u of her breast cancer.  She will continue with neoadjuvant  chemotherapy with weekly Paclitaxel.  She is tolerating this quite well.  She has not had any peripheral neuropathy at this point.    Monike's hemoglobin is 8.  This is stable.  I reviewed her CBC with her which was otherwise normal, her CMET is pending.  She will proceed with chemo with her hemoglobin of 8.    Luis and I reviewed her schedule and we will see her with every other dose of Paclitaxel for the first 8 cycles.  Beginning with cycle 9 we will see her with every other cycle.  She understands this.    Irma will return in one week for labs and Paclitaxel with an office visit in 2 weeks.  She knows to call for any questions that may arise between now and her next appointment.  We are happy to see her sooner if needed.  Total encounter time 20 minutes.* in face to face visit time, chart review, lab review, care coordination, and documentation of the encounter.   Wilber Bihari, NP 03/24/21 8:50 AM Medical Oncology and Hematology [facility] [place], [place] [address] Tel. [phone]    Fax. [phone]     *Total Encounter Time as defined by the Centers for Medicare and Medicaid Services includes, in addition to the face-to-face time of a patient visit (documented in the note above) non-face-to-face time: obtaining and reviewing outside history, ordering and reviewing medications, tests or procedures, care coordination (communications with other health care professionals or caregivers) and documentation in the medical record.

## 2021-04-01 ENCOUNTER — Other Ambulatory Visit: Payer: Self-pay

## 2021-04-01 DIAGNOSIS — R0683 Snoring: Secondary | ICD-10-CM

## 2021-04-01 DIAGNOSIS — R400 Somnolence: Secondary | ICD-10-CM

## 2021-04-01 NOTE — Progress Notes (Signed)
Order palced

## 2021-04-01 NOTE — Telephone Encounter (Signed)
Received a denial from [facility] for split night sleep study. Message will be sent to ordering provider ok for HST.

## 2021-04-01 NOTE — Telephone Encounter (Signed)
HST will be fine

## 2021-04-02 ENCOUNTER — Ambulatory Visit (INDEPENDENT_AMBULATORY_CARE_PROVIDER_SITE_OTHER): Payer: Medicaid Other | Admitting: Clinical

## 2021-04-02 ENCOUNTER — Encounter (INDEPENDENT_AMBULATORY_CARE_PROVIDER_SITE_OTHER): Payer: Self-pay

## 2021-04-02 ENCOUNTER — Other Ambulatory Visit: Payer: Self-pay

## 2021-04-02 DIAGNOSIS — F333 Major depressive disorder, recurrent, severe with psychotic symptoms: Secondary | ICD-10-CM

## 2021-04-02 NOTE — Telephone Encounter (Signed)
Prior Authorization for HST sent to [facility] via web portal. Tracking Number [id number]. Reference # [id number].

## 2021-04-07 ENCOUNTER — Other Ambulatory Visit: Payer: Self-pay | Admitting: Oncology

## 2021-04-07 ENCOUNTER — Other Ambulatory Visit: Payer: Self-pay

## 2021-04-07 ENCOUNTER — Telehealth: Payer: Self-pay | Admitting: *Deleted

## 2021-04-07 ENCOUNTER — Other Ambulatory Visit: Payer: Self-pay | Admitting: *Deleted

## 2021-04-07 ENCOUNTER — Inpatient Hospital Stay: Payer: Medicaid Other

## 2021-04-07 ENCOUNTER — Other Ambulatory Visit (HOSPITAL_COMMUNITY): Payer: Self-pay

## 2021-04-07 VITALS — BP 138/83 | HR 75 | Temp 98.4°F | Resp 16 | Wt 218.2 lb

## 2021-04-07 DIAGNOSIS — Z95828 Presence of other vascular implants and grafts: Secondary | ICD-10-CM

## 2021-04-07 DIAGNOSIS — C50412 Malignant neoplasm of upper-outer quadrant of left female breast: Secondary | ICD-10-CM

## 2021-04-07 DIAGNOSIS — Z170 Estrogen receptor positive status [ER+]: Secondary | ICD-10-CM

## 2021-04-07 DIAGNOSIS — Z5111 Encounter for antineoplastic chemotherapy: Secondary | ICD-10-CM | POA: Diagnosis not present

## 2021-04-07 DIAGNOSIS — C50411 Malignant neoplasm of upper-outer quadrant of right female breast: Secondary | ICD-10-CM

## 2021-04-07 LAB — COMPREHENSIVE METABOLIC PANEL
ALT: 30 U/L (ref 0–44)
AST: 24 U/L (ref 15–41)
Albumin: 3.8 g/dL (ref 3.5–5.0)
Alkaline Phosphatase: 92 U/L (ref 38–126)
Anion gap: 11 (ref 5–15)
BUN: 10 mg/dL (ref 6–20)
CO2: 25 mmol/L (ref 22–32)
Calcium: 9.4 mg/dL (ref 8.9–10.3)
Chloride: 107 mmol/L (ref 98–111)
Creatinine, Ser: 0.75 mg/dL (ref 0.44–1.00)
GFR, Estimated: >60 mL/min (ref >60)
Glucose, Bld: 111 mg/dL — ABNORMAL HIGH (ref 70–99)
Potassium: 3.9 mmol/L (ref 3.5–5.1)
Sodium: 143 mmol/L (ref 135–145)
Total Bilirubin: 0.2 mg/dL — ABNORMAL LOW (ref 0.3–1.2)
Total Protein: 7.1 g/dL (ref 6.5–8.1)

## 2021-04-07 LAB — CBC WITH DIFFERENTIAL/PLATELET
Abs Immature Granulocytes: 0.04 10*3/uL (ref 0.00–0.07)
Basophils Absolute: 0.1 10*3/uL (ref 0.0–0.1)
Basophils Relative: 1 %
Eosinophils Absolute: 0.1 10*3/uL (ref 0.0–0.5)
Eosinophils Relative: 1 %
HCT: 25.9 % — ABNORMAL LOW (ref 36.0–46.0)
Hemoglobin: 8 g/dL — ABNORMAL LOW (ref 12.0–15.0)
Immature Granulocytes: 1 %
Lymphocytes Relative: 24 %
Lymphs Abs: 1.2 10*3/uL (ref 0.7–4.0)
MCH: 23.4 pg — ABNORMAL LOW (ref 26.0–34.0)
MCHC: 30.9 g/dL (ref 30.0–36.0)
MCV: 75.7 fL — ABNORMAL LOW (ref 80.0–100.0)
Monocytes Absolute: 0.5 10*3/uL (ref 0.1–1.0)
Monocytes Relative: 11 %
Neutro Abs: 3 10*3/uL (ref 1.7–7.7)
Neutrophils Relative %: 62 %
Platelets: 452 10*3/uL — ABNORMAL HIGH (ref 150–400)
RBC: 3.42 MIL/uL — ABNORMAL LOW (ref 3.87–5.11)
RDW: 21.1 % — ABNORMAL HIGH (ref 11.5–15.5)
WBC: 4.8 10*3/uL (ref 4.0–10.5)
nRBC: 0 % (ref 0.0–0.2)

## 2021-04-07 MED ORDER — FAMOTIDINE 20 MG IN NS 100 ML IVPB
20.0000 mg | Freq: Once | INTRAVENOUS | Status: AC
  Administered 2021-04-07: 20 mg via INTRAVENOUS
  Filled 2021-04-07: qty 100

## 2021-04-07 MED ORDER — HEPARIN SOD (PORK) LOCK FLUSH 100 UNIT/ML IV SOLN
500.0000 [IU] | Freq: Once | INTRAVENOUS | Status: AC | PRN
  Administered 2021-04-07: 500 [IU]

## 2021-04-07 MED ORDER — DIPHENHYDRAMINE HCL 50 MG/ML IJ SOLN
25.0000 mg | Freq: Once | INTRAMUSCULAR | Status: AC
  Administered 2021-04-07: 25 mg via INTRAVENOUS
  Filled 2021-04-07: qty 1

## 2021-04-07 MED ORDER — SODIUM CHLORIDE 0.9% FLUSH
10.0000 mL | Freq: Once | INTRAVENOUS | Status: AC
  Administered 2021-04-07: 10 mL

## 2021-04-07 MED ORDER — SODIUM CHLORIDE 0.9% FLUSH
10.0000 mL | INTRAVENOUS | Status: DC | PRN
  Administered 2021-04-07: 10 mL

## 2021-04-07 MED ORDER — SODIUM CHLORIDE 0.9 % IV SOLN: Freq: Once | INTRAVENOUS | Status: AC

## 2021-04-07 MED ORDER — DEXAMETHASONE SODIUM PHOSPHATE 10 MG/ML IJ SOLN
4.0000 mg | Freq: Once | INTRAMUSCULAR | Status: AC
  Administered 2021-04-07: 4 mg via INTRAVENOUS
  Filled 2021-04-07: qty 1

## 2021-04-07 MED ORDER — SODIUM CHLORIDE 0.9 % IV SOLN
80.0000 mg/m2 | Freq: Once | INTRAVENOUS | Status: AC
  Administered 2021-04-07: 180 mg via INTRAVENOUS
  Filled 2021-04-07: qty 30

## 2021-04-07 MED ORDER — GABAPENTIN 300 MG PO CAPS
300.0000 mg | ORAL_CAPSULE | Freq: Every day | ORAL | 0 refills | Status: DC
  Filled 2021-04-07: qty 30, 30d supply, fill #0

## 2021-04-07 NOTE — Telephone Encounter (Signed)
Per chat from treatment room nurse- "Good morning Val! I'm getting her started but Brayton Layman will be her nurse today- during her intake assessment she reported 10/10 generalized pain and says it is because of the chemotherapy. I asked if she had any pain medication prescribed and she said "they won't give me any". I don't know much about her history, but I wanted to see if there was any way we could get her some pain medication sent in to her pharmacy. She declined tylenol here. Thanks! "  This RN informed  dr Jannifer Rodney per her pain issue- BUT noted her primary MD referred her in august to pain management MD dr Holley Raring- no noted appt - so I called and obtained next available appt on 11/22 at 2 pm.  noted per chart review- she has not had gabapentin- Dr Jana Hakim will send in order to her pharmacy to start at 300 mg nightly and she can tell us if beneficial for possible titration up if needed.  Informed treatment room nurse per above-as well as - let her know ideally the gabapentin is to help until she can get to the pain management MD who can take over the medication.  Prescription for gabapentin sent to [facility].

## 2021-04-07 NOTE — Progress Notes (Signed)
Pt. made aware of appointment time with Dr. Holley Raring 05/12/21 at 2:00 pm and written on discharge paperwork. Pt. instructed on Gabapentin prescription and need to pick up at [facility]. Pt. states she understands.

## 2021-04-07 NOTE — Patient Instructions (Signed)
[facility]  Discharge Instructions: Thank you for choosing [facility] to provide your oncology and hematology care.   If you have a lab appointment with the [facility], please go directly to the [facility] and check in at the registration area.   Wear comfortable clothing and clothing appropriate for easy access to any Portacath or PICC line.   We strive to give you quality time with your provider. You may need to reschedule your appointment if you arrive late (15 or more minutes).  Arriving late affects you and other patients whose appointments are after yours.  Also, if you miss three or more appointments without notifying the office, you may be dismissed from the clinic at the provider's discretion.      For prescription refill requests, have your pharmacy contact our office and allow 72 hours for refills to be completed.    Today you received the following chemotherapy and/or immunotherapy agents Paclitaxel (Taxol)      To help prevent nausea and vomiting after your treatment, we encourage you to take your nausea medication as directed.  BELOW ARE SYMPTOMS THAT SHOULD BE REPORTED IMMEDIATELY: *FEVER GREATER THAN 100.4 F (38 C) OR HIGHER *CHILLS OR SWEATING *NAUSEA AND VOMITING THAT IS NOT CONTROLLED WITH YOUR NAUSEA MEDICATION *UNUSUAL SHORTNESS OF BREATH *UNUSUAL BRUISING OR BLEEDING *URINARY PROBLEMS (pain or burning when urinating, or frequent urination) *BOWEL PROBLEMS (unusual diarrhea, constipation, pain near the anus) TENDERNESS IN MOUTH AND THROAT WITH OR WITHOUT PRESENCE OF ULCERS (sore throat, sores in mouth, or a toothache) UNUSUAL RASH, SWELLING OR PAIN  UNUSUAL VAGINAL DISCHARGE OR ITCHING   Items with * indicate a potential emergency and should be followed up as soon as possible or go to the Emergency Department if any problems should occur.  Please show the CHEMOTHERAPY ALERT CARD or IMMUNOTHERAPY ALERT CARD at  check-in to the Emergency Department and triage nurse.  Should you have questions after your visit or need to cancel or reschedule your appointment, please contact [facility]  Dept: [phone]  and follow the prompts.  Office hours are 8:00 a.m. to 4:30 p.m. Monday - Friday. Please note that voicemails left after 4:00 p.m. may not be returned until the following business day.  We are closed weekends and major holidays. You have access to a nurse at all times for urgent questions. Please call the main number to the clinic Dept: [phone] and follow the prompts.   For any non-urgent questions, you may also contact your provider using MyChart. We now offer e-Visits for anyone 40 and older to request care online for non-urgent symptoms. For details visit [email or web address].   Also download the MyChart app! Go to the app store, search "MyChart", open the app, select [facility], and log in with your MyChart username and password.  Due to Covid, a mask is required upon entering the hospital/clinic. If you do not have a mask, one will be given to you upon arrival. For doctor visits, patients may have 1 support person aged 101 or older with them. For treatment visits, patients cannot have anyone with them due to current Covid guidelines and our immunocompromised population.

## 2021-04-08 ENCOUNTER — Ambulatory Visit
Payer: Medicaid Other | Attending: Student in an Organized Health Care Education/Training Program | Admitting: Student in an Organized Health Care Education/Training Program

## 2021-04-08 ENCOUNTER — Encounter: Payer: Self-pay | Admitting: Student in an Organized Health Care Education/Training Program

## 2021-04-08 ENCOUNTER — Other Ambulatory Visit (HOSPITAL_COMMUNITY): Payer: Self-pay

## 2021-04-08 VITALS — BP 153/105 | HR 82 | Temp 97.2°F | Resp 15 | Ht 71.0 in | Wt 221.4 lb

## 2021-04-08 DIAGNOSIS — C50411 Malignant neoplasm of upper-outer quadrant of right female breast: Secondary | ICD-10-CM

## 2021-04-08 DIAGNOSIS — Z170 Estrogen receptor positive status [ER+]: Secondary | ICD-10-CM | POA: Diagnosis present

## 2021-04-08 DIAGNOSIS — C50412 Malignant neoplasm of upper-outer quadrant of left female breast: Secondary | ICD-10-CM | POA: Diagnosis present

## 2021-04-08 DIAGNOSIS — E669 Obesity, unspecified: Secondary | ICD-10-CM

## 2021-04-08 DIAGNOSIS — G894 Chronic pain syndrome: Secondary | ICD-10-CM | POA: Diagnosis not present

## 2021-04-08 MED ORDER — GABAPENTIN 300 MG PO CAPS
300.0000 mg | ORAL_CAPSULE | Freq: Two times a day (BID) | ORAL | 0 refills | Status: DC
  Filled 2021-04-08 – 2021-04-14 (×2): qty 60, 30d supply, fill #0

## 2021-04-08 NOTE — Progress Notes (Signed)
Patient: Chelsea Hansen  Service Category: E/M  Provider: Gillis Santa, MD  DOB: 22-Dec-1970  DOS: 04/08/2021  Referring Provider: [name], NP  MRN: [id number]  Setting: Ambulatory outpatient  PCP: [name], NP  Type: New Patient  Specialty: Interventional Pain Management    Location: Office  Delivery: Face-to-face     Primary Reason(s) for Visit: Encounter for initial evaluation of one or more chronic problems (new to examiner) potentially causing chronic pain, and posing a threat to normal musculoskeletal function. (Level of risk: High) CC: Pain (Generalized joint and body pain)  HPI  Chelsea Hansen is a 50 y.o. year old, female patient, who comes for the first time to our practice referred by [name], NP for our initial evaluation of her chronic pain. She has Syncope; Mediastinal adenopathy; Anemia; Chest pain; Asthma; Bradycardia; Hypokalemia; Malignant neoplasm of upper-outer quadrant of right breast in female, estrogen receptor positive ([place]); Malignant neoplasm of upper-outer quadrant of left breast in female, estrogen receptor positive ([place]); Family history of uterine cancer; Genetic testing; Port-A-Cath in place; Palpitations; Sinus bradycardia; Daytime somnolence; Snoring; and Obesity (BMI 30-39.9) on their problem list. Today she comes in for evaluation of her Pain (Generalized joint and body pain)  Pain Assessment: Location:   Generalized Radiating:   Onset: More than a month ago Duration: Chronic pain Quality: Aching, Burning, Sharp, Shooting Severity: 8 /10 (subjective, self-reported pain score)  Effect on ADL: limits daily activities, "sometimes I cannot get out of bed" Timing: Constant Modifying factors: nothing BP: (!) 153/105  HR: 82  Onset and Duration: Present longer than 3 months Cause of pain:  cancer/chemotherapy Severity: NAS-11 at its worse: 0/10, NAS-11 at its best: 1/10, NAS-11 now: 8/10, and NAS-11 on the average: 10/10 Timing:  Morning, Afternoon, and Night Aggravating Factors: Kneeling, Lifiting, Prolonged sitting, and Prolonged standing Alleviating Factors:  no alleviating factors Associated Problems: Depression, Fatigue, Numbness, Sadness, Spasms, Tingling, Vomiting , Weakness, Pain that wakes patient up, and Pain that does not allow patient to sleep Quality of Pain: Aching, Burning, Distressing, Fearful, Getting shorter, Heavy, Hot, Sharp, Shooting, Splitting, Stabbing, Tender, and Tingling Previous Examinations or Tests: Neurological evaluation Previous Treatments: The patient denies any previous treatments   Chelsea Hansen is a pleasant 50 year old female who unfortunately was diagnosed with breast cancer this past May.  She is undergoing chemotherapy.  She has a family history of breast cancer and states that her mother had a very young.  She has diffuse whole body pain that is most pronounced in her low back and hips.  She has tried acetaminophen and NSAIDs with limited response.  She was recently prescribed gabapentin at 300 mg which she has yet to start.  She is also seeing a therapist to help with anxiety and depression associated with her recent diagnosis of cancer.  She has tried tramadol in the past with limited response.  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  Historic Controlled Substance Pharmacotherapy Review   Historical Monitoring: The patient  reports no history of drug use. List of all UDS Test(s): Lab Results  Component Value Date   COCAINSCRNUR NONE DETECTED 12/31/2011   THCU NONE DETECTED 12/31/2011   ETH 153 (H) 04/30/2012   List of other Serum/Urine Drug Screening Test(s):  Lab Results  Component Value Date   COCAINSCRNUR NONE DETECTED 12/31/2011   THCU NONE DETECTED 12/31/2011   ETH 153 (H) 04/30/2012  Historical Background Evaluation: [place] PMP: PDMP reviewed during this encounter. Online review of the past  27-monthperiod conducted.              Department of public safety, offender search: (Editor, commissioningInformation) Non-contributory Risk Assessment Profile: Aberrant behavior: None observed or detected today Risk factors for fatal opioid overdose: None identified today Fatal overdose hazard ratio (HR): Calculation deferred Non-fatal overdose hazard ratio (HR): Calculation deferred Risk of opioid abuse or dependence: 0.7-3.0% with doses ? 36 MME/day and 6.1-26% with doses ? 120 MME/day. Substance use disorder (SUD) risk level: See below Personal History of Substance Abuse (SUD-Substance use disorder):  Alcohol: Negative  Illegal Drugs: Negative  Rx Drugs: Negative  ORT Risk Level calculation: Low Risk  Opioid Risk Tool - 04/08/21 0816       Family History of Substance Abuse   Alcohol Negative    Illegal Drugs Negative    Rx Drugs Negative      Personal History of Substance Abuse   Alcohol Negative    Illegal Drugs Negative    Rx Drugs Negative      Age   Age between 144-49years  No      Psychological Disease   Psychological Disease Negative    Depression Positive      Total Score   Opioid Risk Tool Scoring 1    Opioid Risk Interpretation Low Risk            ORT Scoring interpretation table:  Score <3 = Low Risk for SUD  Score between 4-7 = Moderate Risk for SUD  Score >8 = High Risk for Opioid Abuse   PHQ-2 Depression Scale:  Total score:    PHQ-2 Scoring interpretation table: (Score and probability of major depressive disorder)  Score 0 = No depression  Score 1 = 15.4% Probability  Score 2 = 21.1% Probability  Score 3 = 38.4% Probability  Score 4 = 45.5% Probability  Score 5 = 56.4% Probability  Score 6 = 78.6% Probability   PHQ-9 Depression Scale:  Total score:    PHQ-9 Scoring interpretation table:  Score 0-4 = No depression  Score 5-9 = Mild depression  Score 10-14 = Moderate depression  Score 15-19 = Moderately severe depression  Score 20-27 = Severe  depression (2.4 times higher risk of SUD and 2.89 times higher risk of overuse)   Pharmacologic Plan: As per protocol, I have not taken over any controlled substance management, pending the results of ordered tests and/or consults.            Initial impression: Pending review of available data and ordered tests.  Meds   Current Outpatient Medications:    acetaminophen (TYLENOL) 500 MG tablet, Take 1 tablet by mouth 3 times daily as needed. Take with Aleve 220 mg, Disp: 90 tablet, Rfl: 0   albuterol (VENTOLIN HFA) 108 (90 Base) MCG/ACT inhaler, Inhale 2 puffs into the lungs every 6 (six) hours as needed for wheezing or shortness of breath., Disp: 18 g, Rfl: 2   amLODipine (NORVASC) 10 MG tablet, Take 1 tablet (10 mg total) by mouth daily., Disp: 90 tablet, Rfl: 1   Cholecalciferol (VITAMIN D3) 2000 units capsule, Take 2,000 Units by mouth daily., Disp: , Rfl: 11   escitalopram (LEXAPRO) 20 MG tablet, Take 1 tablet (20 mg total) by mouth daily., Disp: 90 tablet, Rfl: 1   fluticasone (FLONASE) 50 MCG/ACT nasal spray, Use 2 sprays in each nostril daily., Disp: 16 g, Rfl: 11  hydrochlorothiazide (HYDRODIURIL) 25 MG tablet, Take 1 tablet (25 mg total) by mouth daily., Disp: 90 tablet, Rfl: 1   loratadine (CLARITIN) 10 MG tablet, Take 1 tablet by mouth daily., Disp: , Rfl:    montelukast (SINGULAIR) 10 MG tablet, Take 1 tablet (10 mg total) by mouth at bedtime., Disp: 90 tablet, Rfl: 3   Multiple Vitamin (MULTIVITAMIN) tablet, Take 2 tablets by mouth daily., Disp: , Rfl:    omeprazole (PRILOSEC) 40 MG capsule, Take 1 capsule by mouth at bedtime. Take days 1 through 5 after each chemotherapy dose, Disp: 60 capsule, Rfl: 0   gabapentin (NEURONTIN) 300 MG capsule, Take 1 capsule (300 mg total) by mouth 2 (two) times daily., Disp: 60 capsule, Rfl: 0 No current facility-administered medications for this visit.  Facility-Administered Medications Ordered in Other Visits:    acetaminophen (TYLENOL) 500 MG  tablet, , , ,    ondansetron (ZOFRAN) 4 MG/2ML injection, , , ,    ondansetron (ZOFRAN) 4 MG/2ML injection, , , ,    sodium chloride flush (NS) 0.9 % injection 10 mL, 10 mL, Intracatheter, PRN, Magrinat, Virgie Dad, MD, 10 mL at 02/10/21 1716  Imaging Review  CLINICAL DATA:  Patient returns after MRI for evaluation of the RIGHT axilla. Recent diagnosis of RIGHT breast grade 2 invasive mammary carcinoma and metastatic RIGHT axillary lymph node and new diagnosis of LEFT breast invasive ductal carcinoma. Biopsies were performed on 12/03/2020. MRI was performed on 12/23/2020, showing a rim enhancing T2 bright lesion in the RIGHT axilla for which ultrasound was recommended. Patient is also scheduled for bilateral MRI biopsies on 12/31/2020.   Today patient complains of pain and swelling in the RIGHT axilla. She reports starting yesterday, a copious amount of yellow cloudy fluid is leaking from the biopsy site in the RIGHT axilla.   EXAM: ULTRASOUND OF THE RIGHT AXILLA   COMPARISON:  Previous exam(s).   FINDINGS: On physical exam, there is a 1 centimeter erythematous site of leaking in the UPPER RIGHT axilla, at the biopsy site per patient. In this region, patient is tender during exam. No fluid is expressed from this lesion during exam.   Targeted ultrasound is performed, showing at the site of patient's concern, there is focal hyperechoic tissue. There is a small heterogeneous fluid collection at the site which measures 2.3 x 0.8 x 1.7 centimeters. Area is vascular on Doppler evaluation. No other abnormalities are identified within the RIGHT axilla to correlate with the MRI finding.   IMPRESSION: The MRI finding and the area of patient's concern today represent a small abscess at the biopsy site. There is no drainable fluid within the collection at this time.   RECOMMENDATION: Recommend course of antibiotics. Patient was given doxycycline 100 milligrams twice a day for 7 days.    Patient is scheduled for bilateral MR guided core biopsy tomorrow.   I have discussed the findings and recommendations with the patient. If applicable, a reminder letter will be sent to the patient regarding the next appointment.   BI-RADS CATEGORY  2: Benign. Complexity Note: Imaging results reviewed. Results shared with Ms. Dillow, using Layman's terms.                         ROS  Cardiovascular: Heart trouble, High blood pressure, Chest pain, and Blood thinners:  Antiplatelet Pulmonary or Respiratory: Wheezing and difficulty taking a deep full breath (Asthma) Neurological: No reported neurological signs or symptoms such as seizures, abnormal skin sensations,  urinary and/or fecal incontinence, being born with an abnormal open spine and/or a tethered spinal cord Psychological-Psychiatric: Anxiousness, Depressed, and Prone to panicking Gastrointestinal: No reported gastrointestinal signs or symptoms such as vomiting or evacuating blood, reflux, heartburn, alternating episodes of diarrhea and constipation, inflamed or scarred liver, or pancreas or irrregular and/or infrequent bowel movements Genitourinary: Difficulty emptying the bladder or controlling the flow of urine (Neurogenic bladder) Hematological: No reported hematological signs or symptoms such as prolonged bleeding, low or poor functioning platelets, bruising or bleeding easily, hereditary bleeding problems, low energy levels due to low hemoglobin or being anemic Endocrine: No reported endocrine signs or symptoms such as high or low blood sugar, rapid heart rate due to high thyroid levels, obesity or weight gain due to slow thyroid or thyroid disease Rheumatologic: No reported rheumatological signs and symptoms such as fatigue, joint pain, tenderness, swelling, redness, heat, stiffness, decreased range of motion, with or without associated rash Musculoskeletal: Negative for myasthenia gravis, muscular dystrophy, multiple sclerosis or  malignant hyperthermia Work History: Out on work excuse  Allergies  Ms. Radford has No Known Allergies.  Laboratory Chemistry Profile   Renal Lab Results  Component Value Date   BUN 10 04/07/2021   CREATININE 0.75 04/07/2021   BCR 15 10/06/2020   GFRAA >60 09/07/2019   GFRNONAA >60 04/07/2021   PROTEINUR 30 (A) 09/07/2019     Electrolytes Lab Results  Component Value Date   NA 143 04/07/2021   K 3.9 04/07/2021   CL 107 04/07/2021   CALCIUM 9.4 04/07/2021   MG 2.0 01/28/2021     Hepatic Lab Results  Component Value Date   AST 24 04/07/2021   ALT 30 04/07/2021   ALBUMIN 3.8 04/07/2021   ALKPHOS 92 04/07/2021   LIPASE 21 01/28/2021     ID Lab Results  Component Value Date   SARSCOV2NAA NEGATIVE 05/28/2020   STAPHAUREUS  08/11/2010    NEGATIVE        The Xpert SA Assay (FDA approved for NASAL specimens only), is one component of a comprehensive surveillance program.  It is not intended to diagnose infection nor to guide or monitor treatment.   MRSAPCR NEGATIVE 08/11/2010   PREGTESTUR NEGATIVE 01/02/2011     Bone No results found for: VD25OH, [id number], QK8638TR7, NH6579UX8, 25OHVITD1, 25OHVITD2, 25OHVITD3, TESTOFREE, TESTOSTERONE   Endocrine Lab Results  Component Value Date   GLUCOSE 111 (H) 04/07/2021   GLUCOSEU NEGATIVE 09/07/2019   TSH 1.130 03/25/2021   FREET4 0.93 04/01/2010     Neuropathy Lab Results  Component Value Date   VITAMINB12 783 12/31/2011   FOLATE 6.4 12/31/2011     CNS No results found for: COLORCSF, APPEARCSF, RBCCOUNTCSF, WBCCSF, POLYSCSF, LYMPHSCSF, EOSCSF, PROTEINCSF, GLUCCSF, JCVIRUS, CSFOLI, IGGCSF, LABACHR, ACETBL, LABACHR, ACETBL   Inflammation (CRP: Acute  ESR: Chronic) Lab Results  Component Value Date   ESRSEDRATE 11 12/31/2011   LATICACIDVEN 0.87 11/01/2013     Rheumatology No results found for: RF, ANA, LABURIC, URICUR, LYMEIGGIGMAB, LYMEABIGMQN, HLAB27   Coagulation Lab Results  Component Value Date    INR 1.10 01/10/2012   LABPROT 14.4 01/10/2012   APTT 32 01/10/2012   PLT 452 (H) 04/07/2021   DDIMER 1.07 (H) 12/31/2011     Cardiovascular Lab Results  Component Value Date   CKTOTAL 96 01/01/2012   CKMB 1.2 01/01/2012   TROPONINI <0.30 01/01/2012   HGB 8.0 (L) 04/07/2021   HCT 25.9 (L) 04/07/2021     Screening Lab Results  Component Value Date  [place] NEGATIVE 05/28/2020   STAPHAUREUS  08/11/2010    NEGATIVE        The Xpert SA Assay (FDA approved for NASAL specimens only), is one component of a comprehensive surveillance program.  It is not intended to diagnose infection nor to guide or monitor treatment.   MRSAPCR NEGATIVE 08/11/2010   PREGTESTUR NEGATIVE 01/02/2011     Cancer No results found for: CEA, CA125, LABCA2   Allergens No results found for: ALMOND, APPLE, ASPARAGUS, AVOCADO, BANANA, BARLEY, BASIL, BAYLEAF, GREENBEAN, LIMABEAN, WHITEBEAN, BEEFIGE, REDBEET, BLUEBERRY, BROCCOLI, CABBAGE, MELON, CARROT, CASEIN, CASHEWNUT, CAULIFLOWER, CELERY     Note: Lab results reviewed.  PFSH  Drug: Ms. Pun  reports no history of drug use. Alcohol:  reports that she does not currently use alcohol after a past usage of about 7.0 standard drinks per week. Tobacco:  reports that she has never smoked. She has never used smokeless tobacco. Medical:  has a past medical history of Allergy, Anxiety, Asthma, Breast cancer ([place]) (2012), Cancer ([place]) (2022), Depression, DVT (deep venous thrombosis) ([place]) (2010), Family history of uterine cancer, GERD (gastroesophageal reflux disease), Headache(784.0), Hyperlipidemia, and Hypertension. Family: family history includes Cancer in her maternal aunt, maternal aunt, and mother; Hypertension in her father; Uterine cancer (age of onset: 32) in her cousin.  Past Surgical History:  Procedure Laterality Date   ABDOMINAL HYSTERECTOMY     BREAST EXCISIONAL BIOPSY Right 09/16/2009   BREAST SURGERY     lumpectomy   PORTACATH PLACEMENT  Left 01/26/2021   Procedure: INSERTION PORT-A-CATH;  Surgeon: [name], MD;  Location: [facility];  Service: General;  Laterality: Left;   TUBAL LIGATION     Active Ambulatory Problems    Diagnosis Date Noted   Syncope 12/31/2011   Mediastinal adenopathy 12/31/2011   Anemia 12/31/2011   Chest pain 12/31/2011   Asthma 01/01/2012   Bradycardia 01/01/2012   Hypokalemia 01/02/2012   Malignant neoplasm of upper-outer quadrant of right breast in female, estrogen receptor positive ([place]) 12/12/2020   Malignant neoplasm of upper-outer quadrant of left breast in female, estrogen receptor positive ([place]) 12/12/2020   Family history of uterine cancer 12/17/2020   Genetic testing 12/24/2020   Port-A-Cath in place 01/27/2021   Palpitations 03/25/2021   Sinus bradycardia 03/25/2021   Daytime somnolence 03/25/2021   Snoring 03/25/2021   Obesity (BMI 30-39.9) 03/25/2021   Resolved Ambulatory Problems    Diagnosis Date Noted   No Resolved Ambulatory Problems   Past Medical History:  Diagnosis Date   Allergy    Anxiety    Breast cancer ([place]) 2012   Cancer ([place]) 2022   Depression    DVT (deep venous thrombosis) ([place]) 2010   GERD (gastroesophageal reflux disease)    Headache(784.0)    Hyperlipidemia    Hypertension    Constitutional Exam  General appearance: Well nourished, well developed, and well hydrated. In no apparent acute distress Vitals:   04/08/21 0812  BP: (!) 153/105  Pulse: 82  Resp: 15  Temp: (!) 97.2 F (36.2 C)  TempSrc: Temporal  SpO2: 100%  Weight: 221 lb 6.4 oz (100.4 kg)  Height: '5\' 11"'  (1.803 m)   BMI Assessment: Estimated body mass index is 30.88 kg/m as calculated from the following:   Height as of this encounter: '5\' 11"'  (1.803 m).   Weight as of this encounter: 221 lb 6.4 oz (100.4 kg).  BMI interpretation table: BMI level Category Range association with higher incidence of chronic pain  <18 kg/m2 Underweight  18.5-24.9 kg/m2 Ideal body weight    25-29.9 kg/m2 Overweight Increased incidence by 20%  30-34.9 kg/m2 Obese (Class I) Increased incidence by 68%  35-39.9 kg/m2 Severe obesity (Class II) Increased incidence by 136%  >40 kg/m2 Extreme obesity (Class III) Increased incidence by 254%   Patient's current BMI Ideal Body weight  Body mass index is 30.88 kg/m. Ideal body weight: 70.8 kg (156 lb 1.4 oz) Adjusted ideal body weight: 82.7 kg (182 lb 3.4 oz)   BMI Readings from Last 4 Encounters:  04/08/21 30.88 kg/m  04/07/21 30.44 kg/m  03/31/21 30.61 kg/m  03/25/21 30.60 kg/m   Wt Readings from Last 4 Encounters:  04/08/21 221 lb 6.4 oz (100.4 kg)  04/07/21 218 lb 4 oz (99 kg)  03/31/21 219 lb 8 oz (99.6 kg)  03/25/21 219 lb 6.4 oz (99.5 kg)    Psych/Mental status: Alert, oriented x 3 (person, place, & time)       Eyes: PERLA Respiratory: No evidence of acute respiratory distress Lumbar Spine Area Exam  Skin & Axial Inspection: No masses, redness, or swelling Alignment: Symmetrical Functional ROM: Pain restricted ROM       Stability: No instability detected Muscle Tone/Strength: Functionally intact. No obvious neuro-muscular anomalies detected. Sensory (Neurological): Musculoskeletal pain pattern  Gait & Posture Assessment  Ambulation: Unassisted Gait: Relatively normal for age and body habitus Posture: WNL  Lower Extremity Exam    Side: Right lower extremity  Side: Left lower extremity  Stability: No instability observed          Stability: No instability observed          Skin & Extremity Inspection: Skin color, temperature, and hair growth are WNL. No peripheral edema or cyanosis. No masses, redness, swelling, asymmetry, or associated skin lesions. No contractures.  Skin & Extremity Inspection: Skin color, temperature, and hair growth are WNL. No peripheral edema or cyanosis. No masses, redness, swelling, asymmetry, or associated skin lesions. No contractures.  Functional ROM: Unrestricted ROM                   Functional ROM: Unrestricted ROM                  Muscle Tone/Strength: Functionally intact. No obvious neuro-muscular anomalies detected.  Muscle Tone/Strength: Functionally intact. No obvious neuro-muscular anomalies detected.  Sensory (Neurological): Unimpaired        Sensory (Neurological): Unimpaired        DTR: Patellar: deferred today Achilles: deferred today Plantar: deferred today  DTR: Patellar: deferred today Achilles: deferred today Plantar: deferred today  Palpation: No palpable anomalies  Palpation: No palpable anomalies   Assessment  Primary Diagnosis & Pertinent Problem List: The primary encounter diagnosis was Chronic pain syndrome (breast cancer). Diagnoses of Malignant neoplasm of upper-outer quadrant of right breast in female, estrogen receptor positive ([place]), Malignant neoplasm of upper-outer quadrant of left breast in female, estrogen receptor positive ([place]), and Obesity (BMI 30-39.9) were also pertinent to this visit.  Visit Diagnosis (New problems to examiner): 1. Chronic pain syndrome (breast cancer)   2. Malignant neoplasm of upper-outer quadrant of right breast in female, estrogen receptor positive ([place])   3. Malignant neoplasm of upper-outer quadrant of left breast in female, estrogen receptor positive ([facility])   4. Obesity (BMI 30-39.9)    Plan of Care (Initial workup plan)  Note: Ms. Don was reminded that as per protocol, today's visit has been an evaluation only. We have not taken over the patient's controlled substance management.  Chronic pain related to breast cancer.  Encouraged her to continue with therapist to help address depression and anxiety associated with her cancer diagnoses.  Encouraged her to increase her gabapentin to 300 mg twice a day.  We will obtain baseline urine toxicology screen which is customary for new patients.  We will forego baseline psychiatric assessment for risk evaluation given that patient is active cancer and is currently  seeing a therapist.  We will follow-up in 2 to 3 weeks after UDS results are in to optimize pain regimen.  We discussed retrial of tramadol but considering a long-acting extended release tramadol in addition to short acting.  We will discuss further when patient follows up.   Lab Orders         Compliance Drug Analysis, Ur      Pharmacotherapy (current): Medications ordered:  Meds ordered this encounter  Medications   gabapentin (NEURONTIN) 300 MG capsule    Sig: Take 1 capsule (300 mg total) by mouth 2 (two) times daily.    Dispense:  60 capsule    Refill:  0   Medications administered during this visit: Leighton Parody had no medications administered during this visit.   Pharmacological management options:  Opioid Analgesics: The patient was informed that there is no guarantee that she would be a candidate for opioid analgesics. The decision will be made following CDC guidelines. This decision will be based on the results of diagnostic studies, as well as Ms. Keetch risk profile.   Membrane stabilizer: Adequate regimen, Gabapentin as above, consider Lyrica, TCA in future  Muscle relaxant: To be determined at a later time  NSAID: To be determined at a later time  Other analgesic(s): To be determined at a later time    Provider-requested follow-up: Return in about 3 weeks (around 04/29/2021) for Medication Management, in person.  I spent a total of 60 minutes reviewing chart data, face-to-face evaluation with the patient, counseling and coordination of care as detailed above.   Future Appointments  Date Time Provider [place]  04/14/2021  9:15 AM [facility] FLUSH [facility] None  04/14/2021  9:45 AM [name], NP [facility] None  04/14/2021 10:30 AM [facility] INFUSION [facility] None  04/16/2021 11:00 AM McCoy, Asante C, LCSW [facility] None  04/21/2021  7:45 AM [facility] FLUSH [facility] None  04/21/2021  8:30 AM [facility] INFUSION [facility]  None  04/28/2021  8:30 AM [facility] FLUSH [facility] None  04/28/2021 10:30 AM [facility] INFUSION [facility] None  05/05/2021  7:45 AM [facility] FLUSH [facility] None  05/05/2021  8:30 AM [facility] INFUSION [facility] None  05/07/2021  8:40 AM Gillis Santa, MD [facility] None  05/12/2021  7:45 AM [facility] FLUSH [facility] None  05/12/2021  8:30 AM [facility] INFUSION [facility] None  07/10/2021  9:00 AM [name], Godfrey Pick, DO [facility]    Note by: Gillis Santa, MD Date: 04/08/2021; Time: 9:18 AM

## 2021-04-08 NOTE — Progress Notes (Signed)
Safety precautions to be maintained throughout the outpatient stay will include: orient to surroundings, keep bed in low position, maintain call bell within reach at all times, provide assistance with transfer out of bed and ambulation.  

## 2021-04-10 ENCOUNTER — Other Ambulatory Visit (HOSPITAL_COMMUNITY): Payer: Self-pay

## 2021-04-13 ENCOUNTER — Telehealth: Payer: Self-pay | Admitting: *Deleted

## 2021-04-13 NOTE — Telephone Encounter (Signed)
Auth for HST received from [facility]. PA# [id number]. Dates 04/20/21 to 06/20/21.

## 2021-04-13 NOTE — Telephone Encounter (Signed)
Patient notified of HST appointment details. °

## 2021-04-13 NOTE — Progress Notes (Signed)
[place]  [phone] [phone]     ID: Chelsea Hansen DOB: Sep 29, 1970  MR#: [id number]  WKM#:[id number]  Patient Care Team: [name], NP as PCP - General (Internal Medicine) [name], DO as PCP - Cardiology (Cardiology) [name], RN as Oncology Nurse Navigator [name], RN as Oncology Nurse Navigator [name], MD as Consulting Physician (Radiation Oncology) Magrinat, Virgie Dad, MD as Consulting Physician (Oncology) [name], MD as Consulting Physician (General Surgery) Scot Dock, NP OTHER MD:  CHIEF COMPLAINT: Bilateral estrogen receptor positive breast cancer  CURRENT TREATMENT:  neoadjuvant chemotherapy   INTERVAL HISTORY: Chelsea Hansen returns today for follow up and treatment of her bilateral breast cancer. She has completed doxorubicin and cyclophosphamide x 4 and is here today for week 4 of 12 weekly paclitaxel.    Chelsea Hansen notes that she is doing quite well on the Paclitaxel as compared to the previous chemotherapy regimen.  She has no peripheral neuropathy.  Her activity level is improved and she worked 5 days last week.  She is overall feeling well.  Chelsea Hansen has booked a trip to [place] from 06/10/2021 through 06/26/2021.  She wants to make sure she has her surgery appointments and imaging scheduled beforehand.    REVIEW OF SYSTEMS: Review of Systems  Constitutional:  Positive for fatigue. Negative for appetite change, chills, fever and unexpected weight change.  HENT:   Negative for hearing loss, lump/mass and trouble swallowing.   Eyes:  Negative for eye problems and icterus.  Respiratory:  Negative for chest tightness, cough and shortness of breath.   Cardiovascular:  Negative for chest pain, leg swelling and palpitations.  Gastrointestinal:  Negative for abdominal distention, abdominal pain, constipation, diarrhea, nausea and vomiting.  Endocrine: Negative for hot flashes.   Genitourinary:  Negative for difficulty urinating.   Musculoskeletal:  Negative for arthralgias.  Skin:  Negative for itching and rash.  Neurological:  Negative for dizziness, extremity weakness, headaches and numbness.  Hematological:  Negative for adenopathy. Does not bruise/bleed easily.  Psychiatric/Behavioral:  Negative for depression. The patient is not nervous/anxious.      COVID 19 VACCINATION STATUS: Chelsea Hansen x2, most recently 10/2020, no booster as of June 2022   HISTORY OF CURRENT ILLNESS: From the original intake note:  Chelsea Hansen has a history of excisional biopsy of a fibroadenoma from the lower outer right breast in 2011.  More recently she noted a knot in her right breast.  She brought it to medical attention and proceeded to bilateral diagnostic mammography with tomography and bilateral breast ultrasonography at [name] on 11/18/2020 showing: breast density category B;   RIGHT: 3.2 cm mass associated with architectural distortion and scattered calcifications in upper-outer right breast at 10 o'clock, corresponding to palpable concern; indeterminate 1.1 cm mass in upper-outer right breast at 9:30; solitary pathologic right axillary lymph node with eccentric cortical thickening up to 7 mm. LEFT: 0.8 cm mass involving upper-outer left breast at 2 o'clock; no pathologic left axillary lymphadenopathy.  Accordingly on 12/03/2020 she proceeded to biopsy of the bilateral breast areas in question. The pathology from this procedure ([id number]) showed:  Right Breast, 9:30 fibroadenoma Right Breast, 10 o'clock Invasive and in situ mammary carcinoma, e-cadherin negative, grade 2 Prognostic indicators significant for: estrogen receptor, 95% positive and progesterone receptor, 100% positive, both with strong staining intensity. Proliferation marker Ki67 at 40%. HER2 negative by immunohistochemistry (0). 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 Grade or-2  Prognostic indicators significant for: estrogen receptor, 95% positive and progesterone receptor, 100% positive, both with strong staining intensity. Proliferation marker Ki67 at 15%. HER2 negative by immunohistochemistry (1+).  Cancer Staging Malignant neoplasm of upper-outer quadrant of left breast in female, estrogen receptor positive ([place]) Staging form: Breast, AJCC 8th Edition - Clinical stage from 12/17/2020: cT1b, cN0, cM0, ER+, PR+, HER2- - Signed by [name], MD on 12/17/2020 Stage prefix: Initial diagnosis Laterality: Left Staged by: Pathologist and managing physician Stage used in treatment planning: Yes National guidelines used in treatment planning: Yes Type of national guideline used in treatment planning: NCCN  Malignant neoplasm of upper-outer quadrant of right breast in female, estrogen receptor positive ([place]) Staging form: Breast, AJCC 8th Edition - Clinical stage from 12/17/2020: cT2, cN1, cM0, ER+, PR+, HER2- - Signed by [name], MD on 12/17/2020 Stage prefix: Initial diagnosis Laterality: Right Staged by: Pathologist and managing physician Stage used in treatment planning: Yes National guidelines used in treatment planning: Yes Type of national guideline used in treatment planning: NCCN  The patient's subsequent history is as detailed below.   PAST MEDICAL HISTORY: Past Medical History:  Diagnosis Date   Allergy    seasonal allergies   Anxiety    on meds   Asthma    uses inhaler   Breast cancer ([place]) 2012   RIGHT lumpectomy   Cancer ([place]) 2022   RIGHT breast lump-dx 2022   Depression    on meds   DVT (deep venous thrombosis) ([place]) 2010   after hysterectomy   Family history of uterine cancer    GERD (gastroesophageal reflux disease)    with certain foods/OTC PRN meds   Headache(784.0)     Hyperlipidemia    on meds   Hypertension    on meds    PAST SURGICAL HISTORY: Past Surgical History:  Procedure Laterality Date   ABDOMINAL HYSTERECTOMY     BREAST EXCISIONAL BIOPSY Right 09/16/2009   BREAST SURGERY     lumpectomy   PORTACATH PLACEMENT Left 01/26/2021   Procedure: INSERTION PORT-A-CATH;  Surgeon: [name], MD;  Location: [facility];  Service: General;  Laterality: Left;   TUBAL LIGATION    Patient's ovaries are still in place, status post hysterectomy 08/18/2010 with benign pathology   FAMILY HISTORY: Family History  Problem Relation Age of Onset   Cancer Mother        ?, dx <50   Hypertension Father    Cancer Maternal Aunt        uterine vs ovarian, dx <50   Cancer Maternal Aunt        unknown type, dx <50   Uterine cancer Cousin 60   Colon polyps Neg Hx    Colon cancer Neg Hx    Esophageal cancer Neg Hx    Stomach cancer Neg Hx    Her father is age 101 and her mother age 66 as of 11/2020. Her mother has a history of cancer (type unsure).  The patient's mother had two sisters also with cancer, one with uterine and the other cancer type is unknown to the patient. Chelsea Hansen has 1 brother and 1 sister.    GYNECOLOGIC HISTORY:  No LMP recorded. Patient has had a hysterectomy. Menarche: 50 years old Age at first live birth: 50 years old [place] P 5 LMP 2012, with hysterectomy Contraceptive never  used HRT never used  Hysterectomy? Yes, 07/2010 for fibroids (pathology in Epic) BSO? no   SOCIAL HISTORY: (updated 11/2020)  Chelsea Hansen is currently working as a Training and development officer for [employer]. She is single and lives at home with her three sons. Her two daughters also live here in [place]. Daughter Chelsea Hansen, age 61, works at [employer]. Daughter Chelsea Hansen, age 65, and son Chelsea Hansen, age 33, work at [employer]. Son Chelsea Hansen, age 6, and daughter Chelsea Hansen, age 84, are currently not employed. Chelsea Hansen is not a Designer, fashion/clothing.    ADVANCED DIRECTIVES: not in  place   HEALTH MAINTENANCE: Social History   Tobacco Use   Smoking status: Never   Smokeless tobacco: Never  Vaping Use   Vaping Use: Never used  Substance Use Topics   Alcohol use: Not Currently    Alcohol/week: 7.0 standard drinks    Types: 7 Standard drinks or equivalent per week    Comment: occassional   Drug use: No     Colonoscopy: 10/2020 (Dr. Fuller Plan), recall 2027  PAP: 05/2010, negative, repeat not indicated (s/p hysterectomy)  Bone density: n/a (age)   No Known Allergies  Current Outpatient Medications  Medication Sig Dispense Refill   acetaminophen (TYLENOL) 500 MG tablet Take 1 tablet by mouth 3 times daily as needed. Take with Aleve 220 mg 90 tablet 0   albuterol (VENTOLIN HFA) 108 (90 Base) MCG/ACT inhaler Inhale 2 puffs into the lungs every 6 (six) hours as needed for wheezing or shortness of breath. 18 g 2   amLODipine (NORVASC) 10 MG tablet Take 1 tablet (10 mg total) by mouth daily. 90 tablet 1   Cholecalciferol (VITAMIN D3) 2000 units capsule Take 2,000 Units by mouth daily.  11   escitalopram (LEXAPRO) 20 MG tablet Take 1 tablet (20 mg total) by mouth daily. 90 tablet 1   fluticasone (FLONASE) 50 MCG/ACT nasal spray Use 2 sprays in each nostril daily. 16 g 11   gabapentin (NEURONTIN) 300 MG capsule Take 1 capsule (300 mg total) by mouth 2 (two) times daily. 60 capsule 0   hydrochlorothiazide (HYDRODIURIL) 25 MG tablet Take 1 tablet (25 mg total) by mouth daily. 90 tablet 1   loratadine (CLARITIN) 10 MG tablet Take 1 tablet by mouth daily.     montelukast (SINGULAIR) 10 MG tablet Take 1 tablet (10 mg total) by mouth at bedtime. 90 tablet 3   Multiple Vitamin (MULTIVITAMIN) tablet Take 2 tablets by mouth daily.     omeprazole (PRILOSEC) 40 MG capsule Take 1 capsule by mouth at bedtime. Take days 1 through 5 after each chemotherapy dose 60 capsule 0   No current facility-administered medications for this visit.   Facility-Administered Medications Ordered in  Other Visits  Medication Dose Route Frequency Provider Last Rate Last Admin   acetaminophen (TYLENOL) 500 MG tablet            ondansetron (ZOFRAN) 4 MG/2ML injection            ondansetron (ZOFRAN) 4 MG/2ML injection            sodium chloride flush (NS) 0.9 % injection 10 mL  10 mL Intracatheter PRN Magrinat, Virgie Dad, MD   10 mL at 02/10/21 1716    OBJECTIVE: African-American woman who appears stated age  Vitals:   04/14/21 0951  BP: 138/88  Pulse: 66  Resp: 16  Temp: 98.1 F (36.7 C)  SpO2: 100%        Body mass index is 30.4 kg/m.  Wt Readings from Last 3 Encounters:  04/14/21 218 lb (98.9 kg)  04/08/21 221 lb 6.4 oz (100.4 kg)  04/07/21 218 lb 4 oz (99 kg)     ECOG FS:1 - Symptomatic but completely ambulatory GENERAL: Patient is a well appearing female in no acute distress HEENT:  Sclerae anicteric.  Oropharynx clear and moist. No ulcerations or evidence of oropharyngeal candidiasis. Neck is supple.  NODES:  No cervical, supraclavicular, or axillary lymphadenopathy palpated.  BREAST EXAM:  No progression of her breast mass noted LUNGS:  Clear to auscultation bilaterally.  No wheezes or rhonchi. HEART:  Regular rate and rhythm. No murmur appreciated. ABDOMEN:  Soft, nontender.  Positive, normoactive bowel sounds. No organomegaly palpated. MSK:  No focal spinal tenderness to palpation. Full range of motion bilaterally in the upper extremities. EXTREMITIES:  No peripheral edema.   SKIN:  hyperpigmentation of hands, and darkening at base of fingernails.   NEURO:  Nonfocal. Well oriented.  Appropriate affect.    LAB RESULTS:  CMP     Component Value Date/Time   NA 141 04/14/2021 0938   NA 139 10/06/2020 1432   K 3.8 04/14/2021 0938   CL 106 04/14/2021 0938   CO2 25 04/14/2021 0938   GLUCOSE 93 04/14/2021 0938   BUN 11 04/14/2021 0938   BUN 11 10/06/2020 1432   CREATININE 0.76 04/14/2021 0938   CREATININE 0.70 03/02/2021 1533   CALCIUM 9.2 04/14/2021 0938    PROT 6.8 04/14/2021 0938   PROT 6.9 10/06/2020 1432   ALBUMIN 3.6 04/14/2021 0938   ALBUMIN 4.1 10/06/2020 1432   AST 19 04/14/2021 0938   AST 14 (L) 03/02/2021 1533   ALT 21 04/14/2021 0938   ALT 14 03/02/2021 1533   ALKPHOS 95 04/14/2021 0938   BILITOT 0.3 04/14/2021 0938   BILITOT 0.3 03/02/2021 1533   GFRNONAA >60 04/14/2021 0938   GFRNONAA >60 03/02/2021 1533   GFRAA >60 09/07/2019 1340    Lab Results  Component Value Date   WBC 5.4 04/14/2021   NEUTROABS 3.4 04/14/2021   HGB 8.1 (L) 04/14/2021   HCT 26.2 (L) 04/14/2021   MCV 77.3 (L) 04/14/2021   PLT 342 04/14/2021     Lab Results  Component Value Date   CA2729 66.9 (H) 02/10/2021       ELIGIBLE FOR AVAILABLE RESEARCH PROTOCOL: no  ASSESSMENT: 50 y.o. [place] woman status post bilateral breast biopsies 12/03/2020, showing  (1) on the right, a clinical T2 N1, stage IIa invasive lobular carcinoma, grade 2, estrogen and progesterone receptor strongly positive, HER2 not amplified, with an MIB-1 of 40%   (a) the biopsied right axillary lymph node was positive with extracapsular extension   (b) a second right breast mass also biopsied was a fibroadenoma, concordant   (c) biopsy of an area of non-mass-like enhancement in the upper right breast pending   (2) on the left, a clinical T1b N0, stage IA invasive ductal carcinoma, grade 1 or 2, estrogen and progesterone receptor positive, HER2 not amplified, with an MIB 1 of 15%   (a) biopsy of a 0.4 cm enhancement in the medial left breast pending  (3) genetics testing through the [facility] BRCAplus panel (report date 12/24/2020) or the CancerNext-Expanded + RNAinsight panel (report date 12/31/2020) found no deleterious mutations in ATM, BRCA1, BRCA2, CDH1, CHEK2, PALB2, PTEN, and TP53. The CancerNext-Expanded + RNAinsight gene panel offered by [employer] and includes sequencing and rearrangement analysis for the following 77 genes: AIP, ALK, APC, ATM, AXIN2, BAP1,  BARD1, BLM,  BMPR1A, BRCA1, BRCA2, BRIP1, CDC73, CDH1, CDK4, CDKN1B, CDKN2A, CHEK2, CTNNA1, DICER1, FANCC, FH, FLCN, GALNT12, KIF1B, LZTR1, MAX, MEN1, MET, MLH1, MSH2, MSH3, MSH6, MUTYH, NBN, NF1, NF2, NTHL1, PALB2, PHOX2B, PMS2, POT1, PRKAR1A, PTCH1, PTEN, RAD51C, RAD51D, RB1, RECQL, RET, SDHA, SDHAF2, SDHB, SDHC, SDHD, SMAD4, SMARCA4, SMARCB1, SMARCE1, STK11, SUFU, TMEM127, TP53, TSC1, TSC2, VHL and XRCC2 (sequencing and deletion/duplication); EGFR, EGLN1, HOXB13, KIT, MITF, PDGFRA, POLD1 and POLE (sequencing only); EPCAM and GREM1 (deletion/duplication only). RNA data is routinely analyzed for use in variant interpretation for all genes.  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  (5) definitive surgery to follow chemotherapy  (6) chemotherapy consisting of doxorubicin and cyclophosphamide in dose dense fashion x4 started 01/27/2021 to be followed by weekly paclitaxel x12.  (7) adjuvant radiation on the right, possibly also on the left  (8) antiestrogens to follow at the completion of local treatment  (9) oral mass noted at time of intubation for port placement:  (A) CT head and neck 02/02/2021 negative  (B) ENT evaluation Dr. Kandis Cocking 02/06/2021 benign note from her February 06 2021 visit notes mild mucositis, thrush, and reflux.  When we see Blair again we will start her on a PPI and Diflucan for thrush   PLAN: Dala is here today for f/u of her breast cancer.  She will continue with neoadjuvant chemotherapy with weekly Paclitaxel.  She is tolerating this quite well.  She has not had any peripheral neuropathy at this point.    Kimaya's hemoglobin is stable and her CMET is pending.  She is ok to proceed with treatment today so long as her CMET is within parameters.    Suanne Marker and I discussed her  upcoming appointment needs including seeing surgery, plastic surgery, and imaging before she leaves on her trip. I have communicated this to our breast navigators to f/u on and arrange.  Yamna had difficulty with MRI and we will need to work around this need for her.  She took several premeds, and if we do need the MRI she may need to have it under sedation.    Melena will return weekly for her paclitaxel and will see me in 2 weeks for follow-up.    Total encounter time 20 minutes.* in face to face visit time, chart review, lab review, care coordination, and documentation of the encounter.   Wilber Bihari, NP 04/14/21 10:29 AM Medical Oncology and Hematology [facility] [place], [place] [address] Tel. [phone]    Fax. [phone]   *Total Encounter Time as defined by the Centers for Medicare and Medicaid Services includes, in addition to the face-to-face time of a patient visit (documented in the note above) non-face-to-face time: obtaining and reviewing outside history, ordering and reviewing medications, tests or procedures, care coordination (communications with other health care professionals or caregivers) and documentation in the medical record.

## 2021-04-13 NOTE — BH Specialist Note (Signed)
Integrated Behavioral Health via Telemedicine Visit  04/02/2021 Chelsea Hansen [id number]  Number of [place] visits: 1/6 Session Start time: 10:00am  Session End time: 10:30am Total time: 30  Referring Provider: PCP Juluis Mire, NP Patient/Family location: Home [facility] Provider location: [facility] All persons participating in visit: Pt and LCSWA Types of Service: Individual psychotherapy and Telephone visit  I connected with Chelsea Hansen via Telephone and verified that I am speaking with the correct person using two identifiers. Discussed confidentiality: Yes   I discussed the limitations of telemedicine and the availability of in person appointments.  Discussed there is a possibility of technology failure and discussed alternative modes of communication if that failure occurs.  I discussed that engaging in this telemedicine visit, they consent to the provision of behavioral healthcare and the services will be billed under their insurance.  Patient and/or legal guardian expressed understanding and consented to Telemedicine visit: Yes   Presenting Concerns: Patient and/or family reports the following symptoms/concerns: Reports feeling depressed, trouble sleeping, decreased energy, decrease appetite, self-esteem disturbances, fidgeting, anxiousness, excessive worrying, irritability, restlessness, and racing thoughts. Reports being diagnosed with cancer several months ago however reports feeling depressed prior to cancer diagnosis. Reports that she is overwhelmed with her health and having to frequently go to the doctor's. Endorses passive SI without plan/intent. Endorses auditory hallucinations. Reports a hx of schizoaffective disorder and being involved in medication management in the past.  Duration of problem: 1+ year; Severity of problem: severe  Patient and/or Family's Strengths/Protective Factors: Concrete supports in place (healthy food, safe environments,  etc.) and Sense of purpose  Goals Addressed: Patient will:  Reduce symptoms of: anxiety and depression   Increase knowledge and/or ability of: coping skills   Demonstrate ability to: Increase healthy adjustment to current life circumstances  Progress towards Goals: Ongoing  Interventions: Interventions utilized:  Mindfulness or Psychologist, educational, CBT Cognitive Behavioral Therapy, Supportive Counseling, and Psychoeducation and/or Health Education Standardized Assessments completed: C-SSRS Short, GAD-7, and PHQ 9  Patient and/or Family Response: Pt receptive to tx. Pt receptive to psychoeducation provided on depression and anxiety. Pt receptive to cognitive restructuring utilized to decrease pt worries and negative thoughts and assist with cognitive processing skills. Pt receptive to utilizing crisis resources if SI arises with plan, means, and intent. Pt receptive to relaxation techniques.  Assessment: Endorses passive SI without plan/intent. Endorses protective factors as children. Verbal safety plan completed and pt provided with crisis resources. Pt acknowledged proper precaution to take if SI arises with plan, means, and intent. Denies HI. Endorses auditory hallucinations. Reports a hx of schizoaffective disorder. Patient currently experiencing depression and anxiety related to physical health problems. Pt has been diagnosed with cancer this year however appears to have experienced depressive episodes prior to cancer diagnosis. Pt is feeling overwhelmed with frequently going to the doctors. Pt appears to have difficulty with focusing on positivity.   Patient may benefit from therapy and medication management. LCSWA provided psychoeducation on depression and anxiety. LCSWA attempted to normalize pt's difficulty with feeling overwhelmed and having positive thoughts since cancer diagnosis. LCSWA utilized cognitive restructuring to decrease negative thoughts and worries and assist with cognitive  processing skills. LCSWA encouraged pt to utilize deep breathing exercises. LCSWA will have pt referred for psychiatry. LCSWA will fu with pt.  Plan: Follow up with behavioral health clinician on : 04/16/21 Behavioral recommendations: Utilize deep breathing exercises and utilize provided crisis resources if SI arises with plan, means, and intent.  Referral(s): Psychiatrist  I discussed the assessment and treatment plan with the patient and/or parent/guardian. They were provided an opportunity to ask questions and all were answered. They agreed with the plan and demonstrated an understanding of the instructions.   They were advised to call back or seek an in-person evaluation if the symptoms worsen or if the condition fails to improve as anticipated.  Chelsea Hansen C Dicy Smigel, LCSW

## 2021-04-14 ENCOUNTER — Other Ambulatory Visit: Payer: Self-pay | Admitting: Oncology

## 2021-04-14 ENCOUNTER — Other Ambulatory Visit (HOSPITAL_COMMUNITY): Payer: Self-pay

## 2021-04-14 ENCOUNTER — Encounter: Payer: Self-pay | Admitting: Adult Health

## 2021-04-14 ENCOUNTER — Other Ambulatory Visit: Payer: Self-pay

## 2021-04-14 ENCOUNTER — Encounter: Payer: Self-pay | Admitting: Oncology

## 2021-04-14 ENCOUNTER — Inpatient Hospital Stay: Payer: Medicaid Other

## 2021-04-14 ENCOUNTER — Inpatient Hospital Stay (HOSPITAL_BASED_OUTPATIENT_CLINIC_OR_DEPARTMENT_OTHER): Payer: Medicaid Other | Admitting: Adult Health

## 2021-04-14 VITALS — BP 138/88 | HR 66 | Temp 98.1°F | Resp 16 | Ht 71.0 in | Wt 218.0 lb

## 2021-04-14 DIAGNOSIS — C50412 Malignant neoplasm of upper-outer quadrant of left female breast: Secondary | ICD-10-CM

## 2021-04-14 DIAGNOSIS — Z170 Estrogen receptor positive status [ER+]: Secondary | ICD-10-CM | POA: Diagnosis not present

## 2021-04-14 DIAGNOSIS — C50411 Malignant neoplasm of upper-outer quadrant of right female breast: Secondary | ICD-10-CM | POA: Diagnosis not present

## 2021-04-14 DIAGNOSIS — Z5111 Encounter for antineoplastic chemotherapy: Secondary | ICD-10-CM | POA: Diagnosis not present

## 2021-04-14 DIAGNOSIS — Z95828 Presence of other vascular implants and grafts: Secondary | ICD-10-CM

## 2021-04-14 LAB — CBC WITH DIFFERENTIAL/PLATELET
Abs Immature Granulocytes: 0.05 10*3/uL (ref 0.00–0.07)
Basophils Absolute: 0.1 10*3/uL (ref 0.0–0.1)
Basophils Relative: 1 %
Eosinophils Absolute: 0.2 10*3/uL (ref 0.0–0.5)
Eosinophils Relative: 4 %
HCT: 26.2 % — ABNORMAL LOW (ref 36.0–46.0)
Hemoglobin: 8.1 g/dL — ABNORMAL LOW (ref 12.0–15.0)
Immature Granulocytes: 1 %
Lymphocytes Relative: 24 %
Lymphs Abs: 1.3 10*3/uL (ref 0.7–4.0)
MCH: 23.9 pg — ABNORMAL LOW (ref 26.0–34.0)
MCHC: 30.9 g/dL (ref 30.0–36.0)
MCV: 77.3 fL — ABNORMAL LOW (ref 80.0–100.0)
Monocytes Absolute: 0.4 10*3/uL (ref 0.1–1.0)
Monocytes Relative: 8 %
Neutro Abs: 3.4 10*3/uL (ref 1.7–7.7)
Neutrophils Relative %: 62 %
Platelets: 342 10*3/uL (ref 150–400)
RBC: 3.39 MIL/uL — ABNORMAL LOW (ref 3.87–5.11)
RDW: 21.5 % — ABNORMAL HIGH (ref 11.5–15.5)
WBC: 5.4 10*3/uL (ref 4.0–10.5)
nRBC: 0.4 % — ABNORMAL HIGH (ref 0.0–0.2)

## 2021-04-14 LAB — COMPREHENSIVE METABOLIC PANEL
ALT: 21 U/L (ref 0–44)
AST: 19 U/L (ref 15–41)
Albumin: 3.6 g/dL (ref 3.5–5.0)
Alkaline Phosphatase: 95 U/L (ref 38–126)
Anion gap: 10 (ref 5–15)
BUN: 11 mg/dL (ref 6–20)
CO2: 25 mmol/L (ref 22–32)
Calcium: 9.2 mg/dL (ref 8.9–10.3)
Chloride: 106 mmol/L (ref 98–111)
Creatinine, Ser: 0.76 mg/dL (ref 0.44–1.00)
GFR, Estimated: >60 mL/min (ref >60)
Glucose, Bld: 93 mg/dL (ref 70–99)
Potassium: 3.8 mmol/L (ref 3.5–5.1)
Sodium: 141 mmol/L (ref 135–145)
Total Bilirubin: 0.3 mg/dL (ref 0.3–1.2)
Total Protein: 6.8 g/dL (ref 6.5–8.1)

## 2021-04-14 MED ORDER — DEXAMETHASONE SODIUM PHOSPHATE 10 MG/ML IJ SOLN
4.0000 mg | Freq: Once | INTRAMUSCULAR | Status: AC
  Administered 2021-04-14: 4 mg via INTRAVENOUS
  Filled 2021-04-14: qty 1

## 2021-04-14 MED ORDER — FAMOTIDINE 20 MG IN NS 100 ML IVPB
20.0000 mg | Freq: Once | INTRAVENOUS | Status: AC
  Administered 2021-04-14: 20 mg via INTRAVENOUS
  Filled 2021-04-14: qty 100

## 2021-04-14 MED ORDER — SODIUM CHLORIDE 0.9 % IV SOLN
80.0000 mg/m2 | Freq: Once | INTRAVENOUS | Status: AC
  Administered 2021-04-14: 180 mg via INTRAVENOUS
  Filled 2021-04-14: qty 30

## 2021-04-14 MED ORDER — SODIUM CHLORIDE 0.9% FLUSH
10.0000 mL | INTRAVENOUS | Status: DC | PRN
  Administered 2021-04-14: 10 mL

## 2021-04-14 MED ORDER — SODIUM CHLORIDE 0.9% FLUSH
10.0000 mL | Freq: Once | INTRAVENOUS | Status: AC
  Administered 2021-04-14: 10 mL

## 2021-04-14 MED ORDER — DIPHENHYDRAMINE HCL 50 MG/ML IJ SOLN
25.0000 mg | Freq: Once | INTRAMUSCULAR | Status: AC
  Administered 2021-04-14: 25 mg via INTRAVENOUS
  Filled 2021-04-14: qty 1

## 2021-04-14 MED ORDER — HEPARIN SOD (PORK) LOCK FLUSH 100 UNIT/ML IV SOLN
500.0000 [IU] | Freq: Once | INTRAVENOUS | Status: AC | PRN
  Administered 2021-04-14: 500 [IU]

## 2021-04-14 MED ORDER — SODIUM CHLORIDE 0.9 % IV SOLN: Freq: Once | INTRAVENOUS | Status: AC

## 2021-04-14 NOTE — Patient Instructions (Signed)
[facility]  Discharge Instructions: Thank you for choosing [facility] to provide your oncology and hematology care.   If you have a lab appointment with the [facility], please go directly to the [facility] and check in at the registration area.   Wear comfortable clothing and clothing appropriate for easy access to any Portacath or PICC line.   We strive to give you quality time with your provider. You may need to reschedule your appointment if you arrive late (15 or more minutes).  Arriving late affects you and other patients whose appointments are after yours.  Also, if you miss three or more appointments without notifying the office, you may be dismissed from the clinic at the provider's discretion.      For prescription refill requests, have your pharmacy contact our office and allow 72 hours for refills to be completed.    Today you received the following chemotherapy and/or immunotherapy agents Paclitaxel (Taxol)      To help prevent nausea and vomiting after your treatment, we encourage you to take your nausea medication as directed.  BELOW ARE SYMPTOMS THAT SHOULD BE REPORTED IMMEDIATELY: *FEVER GREATER THAN 100.4 F (38 C) OR HIGHER *CHILLS OR SWEATING *NAUSEA AND VOMITING THAT IS NOT CONTROLLED WITH YOUR NAUSEA MEDICATION *UNUSUAL SHORTNESS OF BREATH *UNUSUAL BRUISING OR BLEEDING *URINARY PROBLEMS (pain or burning when urinating, or frequent urination) *BOWEL PROBLEMS (unusual diarrhea, constipation, pain near the anus) TENDERNESS IN MOUTH AND THROAT WITH OR WITHOUT PRESENCE OF ULCERS (sore throat, sores in mouth, or a toothache) UNUSUAL RASH, SWELLING OR PAIN  UNUSUAL VAGINAL DISCHARGE OR ITCHING   Items with * indicate a potential emergency and should be followed up as soon as possible or go to the Emergency Department if any problems should occur.  Please show the CHEMOTHERAPY ALERT CARD or IMMUNOTHERAPY ALERT CARD at  check-in to the Emergency Department and triage nurse.  Should you have questions after your visit or need to cancel or reschedule your appointment, please contact [facility]  Dept: [phone]  and follow the prompts.  Office hours are 8:00 a.m. to 4:30 p.m. Monday - Friday. Please note that voicemails left after 4:00 p.m. may not be returned until the following business day.  We are closed weekends and major holidays. You have access to a nurse at all times for urgent questions. Please call the main number to the clinic Dept: [phone] and follow the prompts.   For any non-urgent questions, you may also contact your provider using MyChart. We now offer e-Visits for anyone 72 and older to request care online for non-urgent symptoms. For details visit [email or web address].   Also download the MyChart app! Go to the app store, search "MyChart", open the app, select [facility], and log in with your MyChart username and password.  Due to Covid, a mask is required upon entering the hospital/clinic. If you do not have a mask, one will be given to you upon arrival. For doctor visits, patients may have 1 support person aged 51 or older with them. For treatment visits, patients cannot have anyone with them due to current Covid guidelines and our immunocompromised population.

## 2021-04-15 LAB — COMPLIANCE DRUG ANALYSIS, UR

## 2021-04-16 ENCOUNTER — Other Ambulatory Visit: Payer: Self-pay

## 2021-04-16 ENCOUNTER — Telehealth: Payer: Self-pay | Admitting: *Deleted

## 2021-04-16 ENCOUNTER — Other Ambulatory Visit: Payer: Self-pay | Admitting: *Deleted

## 2021-04-16 ENCOUNTER — Ambulatory Visit (INDEPENDENT_AMBULATORY_CARE_PROVIDER_SITE_OTHER): Payer: Medicaid Other | Admitting: Clinical

## 2021-04-16 DIAGNOSIS — F333 Major depressive disorder, recurrent, severe with psychotic symptoms: Secondary | ICD-10-CM

## 2021-04-16 DIAGNOSIS — Z170 Estrogen receptor positive status [ER+]: Secondary | ICD-10-CM

## 2021-04-16 NOTE — Telephone Encounter (Signed)
Left vm informing pt of post neoadjuvant MRI to determine response to chemotherapy. Informed open MRI has been ordered. Contact information provided for questions or needs.

## 2021-04-17 NOTE — BH Specialist Note (Signed)
Integrated Behavioral Health Follow Up In-Person Visit  MRN: [id number] Name: Chelsea Hansen  Number of [facility] Clinician visits: 2/6 Session Start time: 11:05  Session End time: 11:55am Total time: 50  minutes  Types of Service: Individual psychotherapy  Interpretor:No. Interpretor Name and Language: N/A  Subjective: Chelsea Hansen is a 50 y.o. female accompanied by  self Patient was referred by PCP Juluis Mire, NP for depression and anxiety. Patient reports the following symptoms/concerns: Reports feeling depressed, trouble sleeping, decreased appetite, anxiousness, worrying, irritability and restlessness. Reports difficulty with coping with cancer diagnosis. Reports that she has difficulty staying still and feels bad if she hasn't been doing anything. Reports that she feels frustrated with her health and does not have any updates on her health but continues to receive tx. Reports getting frustrated with her family because they are "overbearing" at times. Endorses auditory hallucinations.  Duration of problem: 1+ year; Severity of problem: severe  Objective: Mood: Anxious and Depressed and Affect: Appropriate and Tearful Risk of harm to self or others: No plan to harm self or others  Life Context: Family and Social: Pt receives support from family.  School/Work: Pt is not working as much due to health. Self-Care: Denies substance use. Pt was prescribed Lexapro from PCP but does not adhere to it due to it causing drowsiness.  Life Changes: Pt was diagnosed with cancer.   Patient and/or Family's Strengths/Protective Factors: Concrete supports in place (healthy food, safe environments, etc.) and Sense of purpose  Goals Addressed: Patient will:  Reduce symptoms of: anxiety and depression   Increase knowledge and/or ability of: coping skills   Demonstrate ability to: Increase healthy adjustment to current life circumstances  Progress towards  Goals: Ongoing  Interventions: Interventions utilized:  Mindfulness or Psychologist, educational, CBT Cognitive Behavioral Therapy, Supportive Counseling, and Psychoeducation and/or Health Education Standardized Assessments completed: GAD-7 and PHQ 9 Depression screen [facility] 2/9 04/16/2021 04/02/2021 02/20/2021 10/06/2020  Decreased Interest 0 1 2 0  Down, Depressed, Hopeless 2 3 1 2   PHQ - 2 Score 2 4 3 2   Altered sleeping 2 3 0 1  Tired, decreased energy 0 2 3 3   Change in appetite 1 2 1  0  Feeling bad or failure about yourself  0 1 3 0  Trouble concentrating 0 0 0 0  Moving slowly or fidgety/restless 0 1 1 0  Suicidal thoughts 0 3 0 0  PHQ-9 Score 5 16 11 6   Difficult doing work/chores - - Somewhat difficult Not difficult at all    GAD 7 : Generalized Anxiety Score 04/16/2021 04/02/2021 02/20/2021 10/06/2020  Nervous, Anxious, on Edge 2 3 0 0  Control/stop worrying 3 3 1  0  Worry too much - different things 3 3 1  0  Trouble relaxing 1 2 0 0  Restless 3 1 1  0  Easily annoyed or irritable 3 3 0 0  Afraid - awful might happen 0 0 0 0  Total GAD 7 Score 15 15 3  0  Anxiety Difficulty - - Somewhat difficult Not difficult at all     Patient and/or Family Response: Pt receptive to tx. Pt receptive to psychoeducation provided on depression, anxiety, and the benefits of sleep. Pt receptive to cognitive restructuring utilized to decrease negative thoughts and assist with cognitive processing skills. Pt receptive to utilizing journalling, meditation, and deep breathing exercises.   Patient Centered Plan: Patient is on the following Treatment Plan(s): Depression and Anxiety  Assessment: Denies SI/HI. Endorses auditory hallucinations. Pt  has hx of schizoaffective disorder. Patient currently experiencing depression and anxiety. Pt continues to have difficulty coping with cancer diagnosis. Pt appears to experience problems with relaxing due to feelings of guilt if she relaxes. Pt expressed frustration with  those who provide tx for cancer.   Patient may benefit from therapy and medication management. LCSWA provided psychoeducation on depression, anxiety, and the benefits of sleep. LCSWA utilized cognitive restructuring to decrease negative thoughts and assist with cognitive processing skills. LCSWA encouraged pt to utilize journalling, adhere to medication, utilize deep breathing exercises, and meditation. Pt is being referred for therapy and psychiatry. LCSWA will fu with pt.  Plan: Follow up with behavioral health clinician on : 04/30/21 Behavioral recommendations: Utilize deep breathing exercises, meditation, journalling, and adhere to medication. Referral(s): [facility] (In Clinic) "From scale of 1-10, how likely are you to follow plan?": 10  Patton Swisher C Aliceson Dolbow, LCSW

## 2021-04-21 ENCOUNTER — Other Ambulatory Visit: Payer: Self-pay

## 2021-04-21 ENCOUNTER — Inpatient Hospital Stay: Payer: Medicaid Other | Attending: Oncology

## 2021-04-21 ENCOUNTER — Other Ambulatory Visit: Payer: Self-pay | Admitting: *Deleted

## 2021-04-21 ENCOUNTER — Inpatient Hospital Stay: Payer: Medicaid Other

## 2021-04-21 VITALS — BP 143/96 | HR 70 | Temp 98.7°F | Resp 18 | Wt 218.4 lb

## 2021-04-21 DIAGNOSIS — C50411 Malignant neoplasm of upper-outer quadrant of right female breast: Secondary | ICD-10-CM

## 2021-04-21 DIAGNOSIS — Z170 Estrogen receptor positive status [ER+]: Secondary | ICD-10-CM | POA: Diagnosis not present

## 2021-04-21 DIAGNOSIS — C50412 Malignant neoplasm of upper-outer quadrant of left female breast: Secondary | ICD-10-CM | POA: Insufficient documentation

## 2021-04-21 DIAGNOSIS — D649 Anemia, unspecified: Secondary | ICD-10-CM

## 2021-04-21 DIAGNOSIS — Z452 Encounter for adjustment and management of vascular access device: Secondary | ICD-10-CM | POA: Diagnosis not present

## 2021-04-21 DIAGNOSIS — Z5111 Encounter for antineoplastic chemotherapy: Secondary | ICD-10-CM | POA: Diagnosis present

## 2021-04-21 DIAGNOSIS — Z95828 Presence of other vascular implants and grafts: Secondary | ICD-10-CM

## 2021-04-21 LAB — COMPREHENSIVE METABOLIC PANEL
ALT: 29 U/L (ref 0–44)
AST: 18 U/L (ref 15–41)
Albumin: 3.4 g/dL — ABNORMAL LOW (ref 3.5–5.0)
Alkaline Phosphatase: 94 U/L (ref 38–126)
Anion gap: 8 (ref 5–15)
BUN: 10 mg/dL (ref 6–20)
CO2: 26 mmol/L (ref 22–32)
Calcium: 8.8 mg/dL — ABNORMAL LOW (ref 8.9–10.3)
Chloride: 106 mmol/L (ref 98–111)
Creatinine, Ser: 0.7 mg/dL (ref 0.44–1.00)
GFR, Estimated: >60 mL/min (ref >60)
Glucose, Bld: 98 mg/dL (ref 70–99)
Potassium: 3.7 mmol/L (ref 3.5–5.1)
Sodium: 140 mmol/L (ref 135–145)
Total Bilirubin: 0.3 mg/dL (ref 0.3–1.2)
Total Protein: 6.4 g/dL — ABNORMAL LOW (ref 6.5–8.1)

## 2021-04-21 LAB — CBC WITH DIFFERENTIAL/PLATELET
Abs Immature Granulocytes: 0.03 10*3/uL (ref 0.00–0.07)
Basophils Absolute: 0 10*3/uL (ref 0.0–0.1)
Basophils Relative: 1 %
Eosinophils Absolute: 0.1 10*3/uL (ref 0.0–0.5)
Eosinophils Relative: 3 %
HCT: 25.3 % — ABNORMAL LOW (ref 36.0–46.0)
Hemoglobin: 7.8 g/dL — ABNORMAL LOW (ref 12.0–15.0)
Immature Granulocytes: 1 %
Lymphocytes Relative: 28 %
Lymphs Abs: 1.3 10*3/uL (ref 0.7–4.0)
MCH: 24.2 pg — ABNORMAL LOW (ref 26.0–34.0)
MCHC: 30.8 g/dL (ref 30.0–36.0)
MCV: 78.6 fL — ABNORMAL LOW (ref 80.0–100.0)
Monocytes Absolute: 0.3 10*3/uL (ref 0.1–1.0)
Monocytes Relative: 7 %
Neutro Abs: 2.8 10*3/uL (ref 1.7–7.7)
Neutrophils Relative %: 60 %
Platelets: 342 10*3/uL (ref 150–400)
RBC: 3.22 MIL/uL — ABNORMAL LOW (ref 3.87–5.11)
RDW: 21.7 % — ABNORMAL HIGH (ref 11.5–15.5)
WBC: 4.6 10*3/uL (ref 4.0–10.5)
nRBC: 0 % (ref 0.0–0.2)

## 2021-04-21 LAB — ABO/RH: ABO/RH(D): B POS

## 2021-04-21 LAB — PREPARE RBC (CROSSMATCH)

## 2021-04-21 MED ORDER — SODIUM CHLORIDE 0.9 % IV SOLN: Freq: Once | INTRAVENOUS | Status: AC

## 2021-04-21 MED ORDER — ACETAMINOPHEN 325 MG PO TABS
650.0000 mg | ORAL_TABLET | Freq: Once | ORAL | Status: AC
  Administered 2021-04-21: 650 mg via ORAL
  Filled 2021-04-21: qty 2

## 2021-04-21 MED ORDER — SODIUM CHLORIDE 0.9% FLUSH
10.0000 mL | INTRAVENOUS | Status: DC | PRN
  Administered 2021-04-21: 10 mL

## 2021-04-21 MED ORDER — DEXAMETHASONE SODIUM PHOSPHATE 10 MG/ML IJ SOLN
4.0000 mg | Freq: Once | INTRAMUSCULAR | Status: AC
  Administered 2021-04-21: 4 mg via INTRAVENOUS
  Filled 2021-04-21: qty 1

## 2021-04-21 MED ORDER — DIPHENHYDRAMINE HCL 50 MG/ML IJ SOLN
25.0000 mg | Freq: Once | INTRAMUSCULAR | Status: AC
  Administered 2021-04-21: 25 mg via INTRAVENOUS
  Filled 2021-04-21: qty 1

## 2021-04-21 MED ORDER — HEPARIN SOD (PORK) LOCK FLUSH 100 UNIT/ML IV SOLN
500.0000 [IU] | Freq: Once | INTRAVENOUS | Status: AC | PRN
  Administered 2021-04-21: 500 [IU]

## 2021-04-21 MED ORDER — SODIUM CHLORIDE 0.9% FLUSH
10.0000 mL | Freq: Once | INTRAVENOUS | Status: AC
Start: 2021-04-21 — End: 2021-04-21
  Administered 2021-04-21: 10 mL

## 2021-04-21 MED ORDER — SODIUM CHLORIDE 0.9 % IV SOLN
80.0000 mg/m2 | Freq: Once | INTRAVENOUS | Status: AC
  Administered 2021-04-21: 180 mg via INTRAVENOUS
  Filled 2021-04-21: qty 30

## 2021-04-21 MED ORDER — FAMOTIDINE 20 MG IN NS 100 ML IVPB
20.0000 mg | Freq: Once | INTRAVENOUS | Status: AC
  Administered 2021-04-21: 20 mg via INTRAVENOUS
  Filled 2021-04-21: qty 100

## 2021-04-21 MED ORDER — SODIUM CHLORIDE 0.9% IV SOLUTION
250.0000 mL | Freq: Once | INTRAVENOUS | Status: AC
  Administered 2021-04-21: 250 mL via INTRAVENOUS

## 2021-04-21 NOTE — Patient Instructions (Signed)
[facility]COL[facility]  Discharge Instructions: Thank you for choosing [facility] to provide your oncology and hematology care.   If you have a lab appointment with the [facility], please go directly to the [facility] and check in at the registration area.   Wear comfortable clothing and clothing appropriate for easy access to any Portacath or PICC line.   We strive to give you quality time with your provider. You may need to reschedule your appointment if you arrive late (15 or more minutes).  Arriving late affects you and other patients whose appointments are after yours.  Also, if you miss three or more appointments without notifying the office, you may be dismissed from the clinic at the provider's discretion.      For prescription refill requests, have your pharmacy contact our office and allow 72 hours for refills to be completed.    Today you received the following chemotherapy and/or immunotherapy agents Taxol      To help prevent nausea and vomiting after your treatment, we encourage you to take your nausea medication as directed.  BELOW ARE SYMPTOMS THAT SHOULD BE REPORTED IMMEDIATELY: *FEVER GREATER THAN 100.4 F (38 C) OR HIGHER *CHILLS OR SWEATING *NAUSEA AND VOMITING THAT IS NOT CONTROLLED WITH YOUR NAUSEA MEDICATION *UNUSUAL SHORTNESS OF BREATH *UNUSUAL BRUISING OR BLEEDING *URINARY PROBLEMS (pain or burning when urinating, or frequent urination) *BOWEL PROBLEMS (unusual diarrhea, constipation, pain near the anus) TENDERNESS IN MOUTH AND THROAT WITH OR WITHOUT PRESENCE OF ULCERS (sore throat, sores in mouth, or a toothache) UNUSUAL RASH, SWELLING OR PAIN  UNUSUAL VAGINAL DISCHARGE OR ITCHING   Items with * indicate a potential emergency and should be followed up as soon as possible or go to the Emergency Department if any problems should occur.  Please show the CHEMOTHERAPY ALERT CARD or IMMUNOTHERAPY ALERT CARD at check-in to the  Emergency Department and triage nurse.  Should you have questions after your visit or need to cancel or reschedule your appointment, please contact [facility]  Dept: [phone]  and follow the prompts.  Office hours are 8:00 a.m. to 4:30 p.m. Monday - Friday. Please note that voicemails left after 4:00 p.m. may not be returned until the following business day.  We are closed weekends and major holidays. You have access to a nurse at all times for urgent questions. Please call the main number to the clinic Dept: [phone] and follow the prompts.   For any non-urgent questions, you may also contact your provider using MyChart. We now offer e-Visits for anyone 51 and older to request care online for non-urgent symptoms. For details visit [email or web address].   Also download the MyChart app! Go to the app store, search "MyChart", open the app, select [facility], and log in with your MyChart username and password.  Due to Covid, a mask is required upon entering the hospital/clinic. If you do not have a mask, one will be given to you upon arrival. For doctor visits, patients may have 1 support person aged 40 or older with them. For treatment visits, patients cannot have anyone with them due to current Covid guidelines and our immunocompromised population.   Blood Transfusion, Adult, Care After This sheet gives you information about how to care for yourself after your procedure. Your health care provider may also give you more specific instructions. If you have problems or questions, contact your health care provider. What can I expect after the procedure? After  the procedure, it is common to have: Bruising and soreness where the IV was inserted. A headache. Follow these instructions at home: IV insertion site care   Follow instructions from your health care provider about how to take care of your IV insertion site. Make sure you: Wash your hands with soap and  water before and after you change your bandage (dressing). If soap and water are not available, use hand sanitizer. Change your dressing as told by your health care provider. Check your IV insertion site every day for signs of infection. Check for: Redness, swelling, or pain. Bleeding from the site. Warmth. Pus or a bad smell. General instructions Take over-the-counter and prescription medicines only as told by your health care provider. Rest as told by your health care provider. Return to your normal activities as told by your health care provider. Keep all follow-up visits as told by your health care provider. This is important. Contact a health care provider if: You have itching or red, swollen areas of skin (hives). You feel anxious. You feel weak after doing your normal activities. You have redness, swelling, warmth, or pain around the IV insertion site. You have blood coming from the IV insertion site that does not stop with pressure. You have pus or a bad smell coming from your IV insertion site. Get help right away if: You have symptoms of a serious allergic or immune system reaction, including: Trouble breathing or shortness of breath. Swelling of the face or feeling flushed. Fever or chills. Pain in the head, back, or chest. Dark urine or blood in the urine. Widespread rash. Fast heartbeat. Feeling dizzy or light-headed. If you receive your blood transfusion in an outpatient setting, you will be told whom to contact to report any reactions. These symptoms may represent a serious problem that is an emergency. Do not wait to see if the symptoms will go away. Get medical help right away. Call your local emergency services (911 in the [place] Do not drive yourself to the hospital. Summary Bruising and tenderness around the IV insertion site are common. Check your IV insertion site every day for signs of infection. Rest as told by your health care provider. Return to your normal  activities as told by your health care provider. Get help right away for symptoms of a serious allergic or immune system reaction to blood transfusion. This information is not intended to replace advice given to you by your health care provider. Make sure you discuss any questions you have with your health care provider. Document Revised: 10/02/2020 Document Reviewed: 11/30/2018 Elsevier Patient Education  [place].

## 2021-04-21 NOTE — Progress Notes (Signed)
Per Dr. Jana Hakim, ok to treat with hgb of 7.8 today, patient to receive blood transfusion.  Patient declines ice to hands & feet during taxol infusion.

## 2021-04-21 NOTE — Progress Notes (Signed)
Orders placed for blood transfusion per MD

## 2021-04-22 ENCOUNTER — Telehealth: Payer: Self-pay | Admitting: Student in an Organized Health Care Education/Training Program

## 2021-04-22 LAB — BPAM RBC
Blood Product Expiration Date: 202211072359
ISSUE DATE / TIME: 202211011241
Unit Type and Rh: 7300

## 2021-04-22 LAB — TYPE AND SCREEN
ABO/RH(D): B POS
Antibody Screen: NEGATIVE
Unit division: 0

## 2021-04-22 NOTE — Telephone Encounter (Signed)
Attempted to call patient, "Your call cannot be completed as dialed." She has only been here once, for her initial eval. Given Gabapentin 300 mg bid. Has upcoming appt on 05-07-21.

## 2021-04-23 ENCOUNTER — Telehealth: Payer: Self-pay | Admitting: Student in an Organized Health Care Education/Training Program

## 2021-04-23 ENCOUNTER — Telehealth: Payer: Self-pay | Admitting: *Deleted

## 2021-04-23 NOTE — Telephone Encounter (Signed)
Patient is c/o increased pain, sharp pain all over body.  Pain is in breast area and in lumbar bilaterally.  Is taking tylenol and reports that is not working.  Denies that gabapentin is helping.  States the pain is so bad that she can barely get out of bed.  Last Chemo was Tuesday 04/21/21, Cancer center recommends tylenol and was given  by Cancer center.  Recommend taking Tylenol and Aleve by CC.    This patient was seen as a new patient on 04/03/21 and has f/up on 05/07/21.

## 2021-04-23 NOTE — Telephone Encounter (Signed)
To close

## 2021-04-24 ENCOUNTER — Telehealth: Payer: Self-pay | Admitting: *Deleted

## 2021-04-24 NOTE — Telephone Encounter (Signed)
Received a message for patient on voicemail.  Return patient's call and had to leave her a voicemail.

## 2021-04-27 ENCOUNTER — Telehealth: Payer: Self-pay | Admitting: *Deleted

## 2021-04-27 NOTE — Telephone Encounter (Signed)
Called and spoke with patient regarding some questions she has regarding her MRI breast and appointments.  Confirmed MRI appt for 12/13 and patient states she will need meds for her anxiety for this. Explained she is seeing Dr. Jana Hakim tomorrow and to let him know that she needs something for this. Patient verbalized understanding. Informed her that I have also sent Dr.Blackman's office a message for an appointment after to discuss sx after her MRI.

## 2021-04-27 NOTE — Progress Notes (Signed)
Chelsea Hansen  [phone]) [phone] [phone]    ID: Chelsea Hansen DOB: 1970/07/11  MR#: [id number]  WKG#:[id number]  Patient Care Team: [name], NP as PCP - General (Internal Medicine) [name], DO as PCP - Cardiology (Cardiology) [name], RN as Oncology Nurse Navigator [name], RN as Oncology Nurse Navigator [name], MD as Consulting Physician (Radiation Oncology) Denise Washburn, Virgie Dad, MD as Consulting Physician (Oncology) [name], MD as Consulting Physician (General Surgery) [name], MD OTHER MD:  CHIEF COMPLAINT: Bilateral estrogen receptor positive breast cancer  CURRENT TREATMENT:  neoadjuvant chemotherapy   INTERVAL HISTORY: Chelsea Hansen returns today for follow up and treatment of her bilateral breast cancer.   She completed doxorubicin and cyclophosphamide x 4 with no dose reductions or delays and is here today for week 6 of 12 weekly paclitaxel.   Chelsea Hansen notes that she is doing quite well on the Paclitaxel as compared to the previous chemotherapy regimen.  She has no peripheral neuropathy.  Her activity level is improved and she worked 5 days last week.  She is overall feeling well.  She is scheduled for posttreatment breast MRI on 06/02/2021.  Chelsea Hansen has booked a trip to [place] from 06/10/2021 through 06/26/2021.  She wants to make sure she has her surgery appointments and imaging scheduled beforehand.     REVIEW OF SYSTEMS: Chelsea Hansen felt quite a bit better after receiving her transfusion last week.  She is working about 3 days a week currently.  She denies any peripheral neuropathy on direct questioning.  She does feel tired.  She has had no nausea or vomiting and no fever.  She is having some hot flashes.  Recall she is status post hysterectomy so she has not been having periods for some time but the hot flashes are new.  A detailed review of systems is otherwise stable.   COVID 19 VACCINATION  STATUS: [place] x2, most recently 10/2020, no booster as of June 2022   HISTORY OF CURRENT ILLNESS: From the original intake note:  Chelsea Hansen has a history of excisional biopsy of a fibroadenoma from the lower outer right breast in 2011.  More recently she noted a knot in her right breast.  She brought it to medical attention and proceeded to bilateral diagnostic mammography with tomography and bilateral breast ultrasonography at [facility] on 11/18/2020 showing: breast density category B;   RIGHT: 3.2 cm mass associated with architectural distortion and scattered calcifications in upper-outer right breast at 10 o'clock, corresponding to palpable concern; indeterminate 1.1 cm mass in upper-outer right breast at 9:30; solitary pathologic right axillary lymph node with eccentric cortical thickening up to 7 mm. LEFT: 0.8 cm mass involving upper-outer left breast at 2 o'clock; no pathologic left axillary lymphadenopathy.  Accordingly on 12/03/2020 she proceeded to biopsy of the bilateral breast areas in question. The pathology from this procedure ([id number]) showed:  Right Breast, 9:30 fibroadenoma Right Breast, 10 o'clock Invasive and in situ mammary carcinoma, e-cadherin negative, grade 2 Prognostic indicators significant for: estrogen receptor, 95% positive and progesterone receptor, 100% positive, both with strong staining intensity. Proliferation marker Ki67 at 40%. HER2 negative by immunohistochemistry (0). 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 Grade or-2  Prognostic indicators significant for: estrogen receptor, 95% positive and progesterone receptor, 100% positive, both with strong  staining intensity. Proliferation marker Ki67 at 15%. HER2 negative by immunohistochemistry (1+).  Cancer Staging Malignant  neoplasm of upper-outer quadrant of left breast in female, estrogen receptor positive ([place]) Staging form: Breast, AJCC 8th Edition - Clinical stage from 12/17/2020: cT1b, cN0, cM0, ER+, PR+, HER2- - Signed by [name], MD on 12/17/2020 Stage prefix: Initial diagnosis Laterality: Left Staged by: Pathologist and managing physician Stage used in treatment planning: Yes National guidelines used in treatment planning: Yes Type of national guideline used in treatment planning: NCCN  Malignant neoplasm of upper-outer quadrant of right breast in female, estrogen receptor positive ([place]) Staging form: Breast, AJCC 8th Edition - Clinical stage from 12/17/2020: cT2, cN1, cM0, ER+, PR+, HER2- - Signed by [name], MD on 12/17/2020 Stage prefix: Initial diagnosis Laterality: Right Staged by: Pathologist and managing physician Stage used in treatment planning: Yes National guidelines used in treatment planning: Yes Type of national guideline used in treatment planning: NCCN  The patient's subsequent history is as detailed below.   PAST MEDICAL HISTORY: Past Medical History:  Diagnosis Date   Allergy    seasonal allergies   Anxiety    on meds   Asthma    uses inhaler   Breast cancer ([facility]) 2012   RIGHT lumpectomy   Cancer ([facility]) 2022   RIGHT breast lump-dx 2022   Depression    on meds   DVT (deep venous thrombosis) ([facility]) 2010   after hysterectomy   Family history of uterine cancer    GERD (gastroesophageal reflux disease)    with certain foods/OTC PRN meds   Headache(784.0)    Hyperlipidemia    on meds   Hypertension    on meds    PAST SURGICAL HISTORY: Past Surgical History:  Procedure Laterality Date   ABDOMINAL HYSTERECTOMY     BREAST EXCISIONAL BIOPSY Right 09/16/2009   BREAST SURGERY     lumpectomy   PORTACATH PLACEMENT Left 01/26/2021   Procedure: INSERTION PORT-A-CATH;  Surgeon: [name], MD;  Location: [facility];  Service: General;  Laterality:  Left;   TUBAL LIGATION    Patient's ovaries are still in place, status post hysterectomy 08/18/2010 with benign pathology   FAMILY HISTORY: Family History  Problem Relation Age of Onset   Cancer Mother        ?, dx <50   Hypertension Father    Cancer Maternal Aunt        uterine vs ovarian, dx <50   Cancer Maternal Aunt        unknown type, dx <50   Uterine cancer Cousin 6   Colon polyps Neg Hx    Colon cancer Neg Hx    Esophageal cancer Neg Hx    Stomach cancer Neg Hx    Her father is age 34 and her mother age 13 as of 11/2020. Her mother has a history of cancer (type unsure).  The patient's mother had two sisters also with cancer, one with uterine and the other cancer type is unknown to the patient. Chelsea Hansen has 1 brother and 1 sister.    GYNECOLOGIC HISTORY:  No LMP recorded. Patient has had a hysterectomy. Menarche: 50 years old Age at first live birth: 50 years old [place] P 5 LMP 2012, with hysterectomy Contraceptive never used HRT never used  Hysterectomy? Yes, 07/2010 for fibroids (pathology in Epic) BSO? no   SOCIAL HISTORY: (updated 11/2020)  Chelsea Hansen is currently working as a Training and development officer for [employer]. She is single and lives at home with  her three sons. Her two daughters also live here in [place]. Daughter Darlinda Bellows, age 27, works at [employer]. Daughter Jilda Panda Sample, age 94, and son Isiah Nils Pyle, age 77, work at [employer]. Son Sharlee Blew, age 61, and daughter Alvera Novel, age 10, are currently not employed. Austine is not a Designer, fashion/clothing.    ADVANCED DIRECTIVES: not in place   HEALTH MAINTENANCE: Social History   Tobacco Use   Smoking status: Never   Smokeless tobacco: Never  Vaping Use   Vaping Use: Never used  Substance Use Topics   Alcohol use: Not Currently    Alcohol/week: 7.0 standard drinks    Types: 7 Standard drinks or equivalent per week    Comment: occassional   Drug use: No     Colonoscopy: 10/2020 (Dr. Fuller Plan), recall 2027  PAP: 05/2010,  negative, repeat not indicated (s/p hysterectomy)  Bone density: n/a (age)   No Known Allergies  Current Outpatient Medications  Medication Sig Dispense Refill   acetaminophen (TYLENOL) 500 MG tablet Take 1 tablet by mouth 3 times daily as needed. Take with Aleve 220 mg 90 tablet 0   albuterol (VENTOLIN HFA) 108 (90 Base) MCG/ACT inhaler Inhale 2 puffs into the lungs every 6 (six) hours as needed for wheezing or shortness of breath. 18 g 2   amLODipine (NORVASC) 10 MG tablet Take 1 tablet (10 mg total) by mouth daily. 90 tablet 1   Cholecalciferol (VITAMIN D3) 2000 units capsule Take 2,000 Units by mouth daily.  11   escitalopram (LEXAPRO) 20 MG tablet Take 1 tablet (20 mg total) by mouth daily. 90 tablet 1   fluticasone (FLONASE) 50 MCG/ACT nasal spray Use 2 sprays in each nostril daily. 16 g 11   gabapentin (NEURONTIN) 300 MG capsule Take 1 capsule (300 mg total) by mouth 2 (two) times daily. 60 capsule 0   hydrochlorothiazide (HYDRODIURIL) 25 MG tablet Take 1 tablet (25 mg total) by mouth daily. 90 tablet 1   loratadine (CLARITIN) 10 MG tablet Take 1 tablet by mouth daily.     montelukast (SINGULAIR) 10 MG tablet Take 1 tablet (10 mg total) by mouth at bedtime. 90 tablet 3   Multiple Vitamin (MULTIVITAMIN) tablet Take 2 tablets by mouth daily.     omeprazole (PRILOSEC) 40 MG capsule Take 1 capsule by mouth at bedtime. Take days 1 through 5 after each chemotherapy dose 60 capsule 0   propranolol (INDERAL) 10 MG tablet Take 1 tablet (10 mg total) by mouth 2 (two) times daily. 180 tablet 3   No current facility-administered medications for this visit.   Facility-Administered Medications Ordered in Other Visits  Medication Dose Route Frequency Provider Last Rate Last Admin   acetaminophen (TYLENOL) 500 MG tablet            ondansetron (ZOFRAN) 4 MG/2ML injection            ondansetron (ZOFRAN) 4 MG/2ML injection            sodium chloride flush (NS) 0.9 % injection 10 mL  10 mL  Intracatheter PRN Shevon Sian, Virgie Dad, MD   10 mL at 02/10/21 1716    OBJECTIVE: African-American woman who appears stated age  Vitals:   04/28/21 0914  BP: 137/83  Pulse: 70  Resp: 18  Temp: 97.7 F (36.5 C)  SpO2: 100%       Body mass index is 30.87 kg/m.   Wt Readings from Last 3 Encounters:  04/28/21 221 lb 4.8 oz (100.4 kg)  04/21/21  218 lb 6 oz (99.1 kg)  04/14/21 218 lb (98.9 kg)     ECOG FS:1 - Symptomatic but completely ambulatory  Sclerae unicteric, EOMs intact Wearing a mask No cervical or supraclavicular adenopathy Lungs no rales or rhonchi Heart regular rate and rhythm Abd soft, nontender, positive bowel sounds MSK no focal spinal tenderness, no upper extremity lymphedema Neuro: nonfocal, well oriented, appropriate affect Breasts: I can still feel some firmness in the right axilla, some with deep palpation.  There is still some thickening in the area in the right breast.  Overall however those findings are improved from baseline.  Left breast and left axilla are benign.   LAB RESULTS:  CMP     Component Value Date/Time   NA 140 04/21/2021 0743   NA 139 10/06/2020 1432   K 3.7 04/21/2021 0743   CL 106 04/21/2021 0743   CO2 26 04/21/2021 0743   GLUCOSE 98 04/21/2021 0743   BUN 10 04/21/2021 0743   BUN 11 10/06/2020 1432   CREATININE 0.70 04/21/2021 0743   CREATININE 0.70 03/02/2021 1533   CALCIUM 8.8 (L) 04/21/2021 0743   PROT 6.4 (L) 04/21/2021 0743   PROT 6.9 10/06/2020 1432   ALBUMIN 3.4 (L) 04/21/2021 0743   ALBUMIN 4.1 10/06/2020 1432   AST 18 04/21/2021 0743   AST 14 (L) 03/02/2021 1533   ALT 29 04/21/2021 0743   ALT 14 03/02/2021 1533   ALKPHOS 94 04/21/2021 0743   BILITOT 0.3 04/21/2021 0743   BILITOT 0.3 03/02/2021 1533   GFRNONAA >60 04/21/2021 0743   GFRNONAA >60 03/02/2021 1533   GFRAA >60 09/07/2019 1340    Lab Results  Component Value Date   WBC 5.2 04/28/2021   NEUTROABS 3.2 04/28/2021   HGB 9.2 (L) 04/28/2021   HCT 29.5  (L) 04/28/2021   MCV 79.3 (L) 04/28/2021   PLT 356 04/28/2021     Lab Results  Component Value Date   CA2729 66.9 (H) 02/10/2021    STUDIES: LONG TERM MONITOR (3-14 DAYS)  Result Date: 04/22/2021 Patch Wear Time:  13 days and 20 hours starting March 28, 2021 Indication: Palpitations Patient had a min HR of 54 bpm, max HR of 214 bpm, and avg HR of 89 bpm. Predominant underlying rhythm was Sinus Rhythm. 5 Supraventricular Tachycardia runs occurred, the run with the fastest interval lasting 11 beats with a max rate of 214 bpm, the longest lasting 8 beats with an avg rate of 142 bpm. Premature atrial complexes were rare (<1.0%). Premature ventricular complexes were rare (<1.0%). No symptoms reported. Conclusion: This study is remarkable for paroxysmal supraventricular tachycardia which is likely atrial tachycardia with variable block.    ELIGIBLE FOR AVAILABLE RESEARCH PROTOCOL: no  ASSESSMENT: 50 y.o. [place] woman status post bilateral breast biopsies 12/03/2020, showing  (1) on the right, a clinical T2 N1, stage IIa invasive lobular carcinoma, grade 2, estrogen and progesterone receptor strongly positive, HER2 not amplified, with an MIB-1 of 40%   (a) the biopsied right axillary lymph node was positive with extracapsular extension   (b) a second right breast mass also biopsied was a fibroadenoma, concordant   (c) biopsy of an area of non-mass-like enhancement in the upper right breast pending   (2) on the left, a clinical T1b N0, stage IA invasive ductal carcinoma, grade 1 or 2, estrogen and progesterone receptor positive, HER2 not amplified, with an MIB 1 of 15%   (a) biopsy of a 0.4 cm enhancement in the medial left breast pending  (  3) genetics testing through the [facility] BRCAplus panel (report date 12/24/2020) or the CancerNext-Expanded + RNAinsight panel (report date 12/31/2020) found no deleterious mutations in ATM, BRCA1, BRCA2, CDH1, CHEK2, PALB2, PTEN, and TP53. The  CancerNext-Expanded + RNAinsight gene panel offered by [employer] and includes sequencing and rearrangement analysis for the following 77 genes: AIP, ALK, APC, ATM, AXIN2, BAP1, BARD1, BLM, BMPR1A, BRCA1, BRCA2, BRIP1, CDC73, CDH1, CDK4, CDKN1B, CDKN2A, CHEK2, CTNNA1, DICER1, FANCC, FH, FLCN, GALNT12, KIF1B, LZTR1, MAX, MEN1, MET, MLH1, MSH2, MSH3, MSH6, MUTYH, NBN, NF1, NF2, NTHL1, PALB2, PHOX2B, PMS2, POT1, PRKAR1A, PTCH1, PTEN, RAD51C, RAD51D, RB1, RECQL, RET, SDHA, SDHAF2, SDHB, SDHC, SDHD, SMAD4, SMARCA4, SMARCB1, SMARCE1, STK11, SUFU, TMEM127, TP53, TSC1, TSC2, VHL and XRCC2 (sequencing and deletion/duplication); EGFR, EGLN1, HOXB13, KIT, MITF, PDGFRA, POLD1 and POLE (sequencing only); EPCAM and GREM1 (deletion/duplication only). RNA data is routinely analyzed for use in variant interpretation for all genes.  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  (5) definitive surgery to follow chemotherapy  (6) chemotherapy consisting of doxorubicin and cyclophosphamide in dose dense fashion x4 started 01/27/2021 to be followed by weekly paclitaxel x12.  (7) adjuvant radiation on the right, possibly also on the left  (8) antiestrogens to follow at the completion of local treatment  (9) oral mass noted at time of intubation for port placement:  (A) CT head and neck 02/02/2021 negative  (B) ENT evaluation Dr. Kandis Cocking 02/06/2021 benign note from her February 06 2021 visit notes mild mucositis, thrush, and reflux.  When we see Chelsea Hansen again we will start her on a PPI and Diflucan for thrush   PLAN: Chelsea Hansen is tolerating her chemotherapy well and she will proceed to her sixth of 12 planned doses of weekly paclitaxel today.  She has been cautioned regarding peripheral neuropathy and if she  develops any numbness or tingling in her toes or fingers she will let us know.  We are otherwise seeing her every other week.  She has a trip planned between December 21 and January 6 and she is going to have her breast MRI and a visit to Dr. Ninfa Linden before the trip.  She tells me that for the MRI she will need some lorazepam.  Chelsea Hansen has complained of total body pain for some time.  She has an appointment with the pain clinic hopefully to deal with that and we will follow-up on it at the next visit  Total encounter time 25 minutes.Sarajane Jews C. Yoko Mcgahee, MD 04/28/21 9:44 AM Medical Oncology and Hematology [facility] [place], [place] [address] Tel. [phone]    Fax. [phone]   I, Wilburn Mylar, am acting as scribe for Dr. Virgie Dad. Viki Carrera.  I, Lurline Del MD, have reviewed the above documentation for accuracy and completeness, and I agree with the above.   *Total Encounter Time as defined by the Centers for Medicare and Medicaid Services includes, in addition to the face-to-face time of a patient visit (documented in the note above) non-face-to-face time: obtaining and reviewing outside history, ordering and reviewing medications, tests or procedures, care coordination (communications with other health care professionals or caregivers) and documentation in the medical record.

## 2021-04-27 NOTE — Telephone Encounter (Signed)
Pt returning phone call... please advise  

## 2021-04-28 ENCOUNTER — Inpatient Hospital Stay: Payer: Medicaid Other

## 2021-04-28 ENCOUNTER — Other Ambulatory Visit: Payer: Self-pay

## 2021-04-28 ENCOUNTER — Inpatient Hospital Stay (HOSPITAL_BASED_OUTPATIENT_CLINIC_OR_DEPARTMENT_OTHER): Payer: Medicaid Other | Admitting: Oncology

## 2021-04-28 ENCOUNTER — Ambulatory Visit: Payer: Medicaid Other | Admitting: Adult Health

## 2021-04-28 ENCOUNTER — Other Ambulatory Visit (HOSPITAL_COMMUNITY): Payer: Self-pay

## 2021-04-28 ENCOUNTER — Telehealth: Payer: Self-pay | Admitting: Cardiology

## 2021-04-28 VITALS — BP 137/83 | HR 70 | Temp 97.7°F | Resp 18 | Ht 71.0 in | Wt 221.3 lb

## 2021-04-28 DIAGNOSIS — C50411 Malignant neoplasm of upper-outer quadrant of right female breast: Secondary | ICD-10-CM

## 2021-04-28 DIAGNOSIS — C50412 Malignant neoplasm of upper-outer quadrant of left female breast: Secondary | ICD-10-CM | POA: Diagnosis not present

## 2021-04-28 DIAGNOSIS — Z170 Estrogen receptor positive status [ER+]: Secondary | ICD-10-CM

## 2021-04-28 DIAGNOSIS — Z5111 Encounter for antineoplastic chemotherapy: Secondary | ICD-10-CM | POA: Diagnosis not present

## 2021-04-28 DIAGNOSIS — Z95828 Presence of other vascular implants and grafts: Secondary | ICD-10-CM

## 2021-04-28 LAB — COMPREHENSIVE METABOLIC PANEL
ALT: 25 U/L (ref 0–44)
AST: 18 U/L (ref 15–41)
Albumin: 3.8 g/dL (ref 3.5–5.0)
Alkaline Phosphatase: 98 U/L (ref 38–126)
Anion gap: 9 (ref 5–15)
BUN: 12 mg/dL (ref 6–20)
CO2: 25 mmol/L (ref 22–32)
Calcium: 9.2 mg/dL (ref 8.9–10.3)
Chloride: 106 mmol/L (ref 98–111)
Creatinine, Ser: 0.78 mg/dL (ref 0.44–1.00)
GFR, Estimated: >60 mL/min (ref >60)
Glucose, Bld: 106 mg/dL — ABNORMAL HIGH (ref 70–99)
Potassium: 4.2 mmol/L (ref 3.5–5.1)
Sodium: 140 mmol/L (ref 135–145)
Total Bilirubin: 0.3 mg/dL (ref 0.3–1.2)
Total Protein: 7 g/dL (ref 6.5–8.1)

## 2021-04-28 LAB — CBC WITH DIFFERENTIAL/PLATELET
Abs Immature Granulocytes: 0.03 10*3/uL (ref 0.00–0.07)
Basophils Absolute: 0 10*3/uL (ref 0.0–0.1)
Basophils Relative: 1 %
Eosinophils Absolute: 0.1 10*3/uL (ref 0.0–0.5)
Eosinophils Relative: 2 %
HCT: 29.5 % — ABNORMAL LOW (ref 36.0–46.0)
Hemoglobin: 9.2 g/dL — ABNORMAL LOW (ref 12.0–15.0)
Immature Granulocytes: 1 %
Lymphocytes Relative: 26 %
Lymphs Abs: 1.4 10*3/uL (ref 0.7–4.0)
MCH: 24.7 pg — ABNORMAL LOW (ref 26.0–34.0)
MCHC: 31.2 g/dL (ref 30.0–36.0)
MCV: 79.3 fL — ABNORMAL LOW (ref 80.0–100.0)
Monocytes Absolute: 0.5 10*3/uL (ref 0.1–1.0)
Monocytes Relative: 9 %
Neutro Abs: 3.2 10*3/uL (ref 1.7–7.7)
Neutrophils Relative %: 61 %
Platelets: 356 10*3/uL (ref 150–400)
RBC: 3.72 MIL/uL — ABNORMAL LOW (ref 3.87–5.11)
RDW: 21.4 % — ABNORMAL HIGH (ref 11.5–15.5)
WBC: 5.2 10*3/uL (ref 4.0–10.5)
nRBC: 0 % (ref 0.0–0.2)

## 2021-04-28 MED ORDER — DEXAMETHASONE SODIUM PHOSPHATE 10 MG/ML IJ SOLN
4.0000 mg | Freq: Once | INTRAMUSCULAR | Status: AC
  Administered 2021-04-28: 4 mg via INTRAVENOUS
  Filled 2021-04-28: qty 1

## 2021-04-28 MED ORDER — FAMOTIDINE 20 MG IN NS 100 ML IVPB
20.0000 mg | Freq: Once | INTRAVENOUS | Status: AC
  Administered 2021-04-28: 20 mg via INTRAVENOUS
  Filled 2021-04-28: qty 100

## 2021-04-28 MED ORDER — SODIUM CHLORIDE 0.9% FLUSH
10.0000 mL | INTRAVENOUS | Status: DC | PRN
  Administered 2021-04-28: 10 mL

## 2021-04-28 MED ORDER — DIPHENHYDRAMINE HCL 50 MG/ML IJ SOLN
25.0000 mg | Freq: Once | INTRAMUSCULAR | Status: AC
  Administered 2021-04-28: 25 mg via INTRAVENOUS
  Filled 2021-04-28: qty 1

## 2021-04-28 MED ORDER — PROPRANOLOL HCL 10 MG PO TABS
10.0000 mg | ORAL_TABLET | Freq: Two times a day (BID) | ORAL | 3 refills | Status: DC
  Filled 2021-04-28: qty 180, 90d supply, fill #0
  Filled 2021-10-27: qty 180, 90d supply, fill #1
  Filled 2022-01-22: qty 180, 90d supply, fill #2

## 2021-04-28 MED ORDER — SODIUM CHLORIDE 0.9 % IV SOLN
80.0000 mg/m2 | Freq: Once | INTRAVENOUS | Status: AC
  Administered 2021-04-28: 180 mg via INTRAVENOUS
  Filled 2021-04-28: qty 30

## 2021-04-28 MED ORDER — SODIUM CHLORIDE 0.9 % IV SOLN: Freq: Once | INTRAVENOUS | Status: AC

## 2021-04-28 MED ORDER — SODIUM CHLORIDE 0.9% FLUSH
10.0000 mL | Freq: Once | INTRAVENOUS | Status: AC
  Administered 2021-04-28: 10 mL

## 2021-04-28 MED ORDER — HEPARIN SOD (PORK) LOCK FLUSH 100 UNIT/ML IV SOLN
500.0000 [IU] | Freq: Once | INTRAVENOUS | Status: AC | PRN
  Administered 2021-04-28: 500 [IU]

## 2021-04-28 NOTE — Telephone Encounter (Signed)
Patient is returning call to discuss monitor results. 

## 2021-04-28 NOTE — Patient Instructions (Signed)
[facility]  Discharge Instructions: Thank you for choosing [facility] to provide your oncology and hematology care.   If you have a lab appointment with the [facility], please go directly to the [facility] and check in at the registration area.   Wear comfortable clothing and clothing appropriate for easy access to any Portacath or PICC line.   We strive to give you quality time with your provider. You may need to reschedule your appointment if you arrive late (15 or more minutes).  Arriving late affects you and other patients whose appointments are after yours.  Also, if you miss three or more appointments without notifying the office, you may be dismissed from the clinic at the provider's discretion.      For prescription refill requests, have your pharmacy contact our office and allow 72 hours for refills to be completed.    Today you received the following chemotherapy and/or immunotherapy agents Taxol      To help prevent nausea and vomiting after your treatment, we encourage you to take your nausea medication as directed.  BELOW ARE SYMPTOMS THAT SHOULD BE REPORTED IMMEDIATELY: *FEVER GREATER THAN 100.4 F (38 C) OR HIGHER *CHILLS OR SWEATING *NAUSEA AND VOMITING THAT IS NOT CONTROLLED WITH YOUR NAUSEA MEDICATION *UNUSUAL SHORTNESS OF BREATH *UNUSUAL BRUISING OR BLEEDING *URINARY PROBLEMS (pain or burning when urinating, or frequent urination) *BOWEL PROBLEMS (unusual diarrhea, constipation, pain near the anus) TENDERNESS IN MOUTH AND THROAT WITH OR WITHOUT PRESENCE OF ULCERS (sore throat, sores in mouth, or a toothache) UNUSUAL RASH, SWELLING OR PAIN  UNUSUAL VAGINAL DISCHARGE OR ITCHING   Items with * indicate a potential emergency and should be followed up as soon as possible or go to the Emergency Department if any problems should occur.  Please show the CHEMOTHERAPY ALERT CARD or IMMUNOTHERAPY ALERT CARD at check-in to the  Emergency Department and triage nurse.  Should you have questions after your visit or need to cancel or reschedule your appointment, please contact [facility]  Dept: [phone]  and follow the prompts.  Office hours are 8:00 a.m. to 4:30 p.m. Monday - Friday. Please note that voicemails left after 4:00 p.m. may not be returned until the following business day.  We are closed weekends and major holidays. You have access to a nurse at all times for urgent questions. Please call the main number to the clinic Dept: [phone] and follow the prompts.   For any non-urgent questions, you may also contact your provider using MyChart. We now offer e-Visits for anyone 47 and older to request care online for non-urgent symptoms. For details visit [email or web address].   Also download the MyChart app! Go to the app store, search "MyChart", open the app, select [facility], and log in with your MyChart username and password.  Due to Covid, a mask is required upon entering the hospital/clinic. If you do not have a mask, one will be given to you upon arrival. For doctor visits, patients may have 1 support person aged 91 or older with them. For treatment visits, patients cannot have anyone with them due to current Covid guidelines and our immunocompromised population.

## 2021-04-28 NOTE — Telephone Encounter (Signed)
Pt made aware of results along with MD's recommendations. Pt state she can't tell when she is having the palpations but willing to try medication. New Rx sent to requested pharmacy.    [name], DO  04/23/2021 12:54 PM EDT     Your monitor did show evidence of some supraventricular tachycardia.  If you are still experiencing palpitation I like to try you on propanolol 10 mg twice daily.

## 2021-04-30 ENCOUNTER — Ambulatory Visit (INDEPENDENT_AMBULATORY_CARE_PROVIDER_SITE_OTHER): Payer: Medicaid Other | Admitting: Clinical

## 2021-04-30 ENCOUNTER — Other Ambulatory Visit: Payer: Self-pay

## 2021-04-30 DIAGNOSIS — F333 Major depressive disorder, recurrent, severe with psychotic symptoms: Secondary | ICD-10-CM

## 2021-05-01 NOTE — BH Specialist Note (Signed)
Integrated Behavioral Health Follow Up In-Person Visit  MRN: [id number] Name: Chelsea Hansen  Number of [facility] Clinician visits: 3/6 Session Start time: 11:10am  Session End time: 12:00pm Total time: 50  minutes  Types of Service: Individual psychotherapy  Interpretor:No. Interpretor Name and Language: N/A  Subjective: Chelsea Hansen is a 50 y.o. female accompanied by  self Patient was referred by PCP Juluis Mire, NP for depression and anxiety. Patient reports the following symptoms/concerns: Reports feeling depressed, trouble sleeping, restlessness, worrying, and irritability. Reports experiencing stress with her boyfriend and that they got into an argument. Reports that her boyfriend drinks almost daily and she wants him to stop. Pt is continuing to cope with cancer diagnosis. Pt continues to experience auditory hallucinations. Reports drinking alcohol once/week due to pan from chemotherapy. Duration of problem: 1+ year; Severity of problem: severe  Objective: Mood: Anxious and Depressed and Affect: Appropriate and Tearful Risk of harm to self or others: No plan to harm self or others  Life Context: Family and Social: Pt receives support from family.  School/Work: Pt is not working as much due to health. Self-Care: Denies substance use. Pt was prescribed Lexapro from PCP but does not adhere to it due to it causing drowsiness. Pt has one drink of alcohol/week due to pain from chemotherapy.  Life Changes: Pt was diagnosed with cancer.   Patient and/or Family's Strengths/Protective Factors: Concrete supports in place (healthy food, safe environments, etc.) and Sense of purpose  Goals Addressed: Patient will:  Reduce symptoms of: anxiety and depression   Increase knowledge and/or ability of: coping skills   Demonstrate ability to: Increase healthy adjustment to current life circumstances  Progress towards Goals: Ongoing  Interventions: Interventions  utilized:  Mindfulness or Psychologist, educational, CBT Cognitive Behavioral Therapy, Supportive Counseling, and Psychoeducation and/or Health Education Standardized Assessments completed: Not Needed  Patient and/or Family Response: Pt receptive to tx. Pt receptive to psychoeducation provided on depression, alcohol use, and the benefits of establishing healthy boundaries. Pt receptive to cognitive restructuring and assistance with communication skills. Pt receptive to continuing deep breathing exercises, meditation, self-care, and journaling. Pt does not plan to increase her alcohol use due to cancer diagnosis.  Patient Centered Plan: Patient is on the following Treatment Plan(s): Depression and Anxiety  Assessment: Denies SI/HI. Endorses auditory hallucinations. Patient currently experiencing depression and anxiety. Pt continues to enhance healthy coping skills since being diagnosed with cancer. Pt reports currently having one drink of alcohol/week and has hx of heavy alcohol use. Pt stopped heavy alcohol use once she was diagnosed with cancer.  Pt appears to experience stress at home.  Patient may benefit from establishing healthy boundaries. LCSWA provided psychoeducation on depression, alcohol use, and the benefits of establishing healthy boundaries. LCSWA encouraged pt to be mindful of alcohol use due to hx of heavy drinking. LCSWA utilized cognitive restructuring and assisted with communication skills. LCSWA encouraged pt to continue deep breathing exercises, meditation, self-care, and journalling. Pt is still being referred for psychiatry. LCSWA will fu with pt.  Plan: Follow up with behavioral health clinician on : 05/21/21 Behavioral recommendations: Utilize deep breathing exercises, meditation, self-care, and journalling. Referral(s): [place] (In Clinic) "From scale of 1-10, how likely are you to follow plan?": 10  Dary Dilauro C Zyara Riling, LCSW

## 2021-05-04 ENCOUNTER — Ambulatory Visit
Payer: Medicaid Other | Attending: Student in an Organized Health Care Education/Training Program | Admitting: Student in an Organized Health Care Education/Training Program

## 2021-05-04 ENCOUNTER — Encounter: Payer: Self-pay | Admitting: Oncology

## 2021-05-04 ENCOUNTER — Other Ambulatory Visit: Payer: Self-pay

## 2021-05-04 ENCOUNTER — Other Ambulatory Visit (HOSPITAL_COMMUNITY): Payer: Self-pay

## 2021-05-04 ENCOUNTER — Encounter: Payer: Self-pay | Admitting: Student in an Organized Health Care Education/Training Program

## 2021-05-04 DIAGNOSIS — C50412 Malignant neoplasm of upper-outer quadrant of left female breast: Secondary | ICD-10-CM | POA: Diagnosis present

## 2021-05-04 DIAGNOSIS — C50411 Malignant neoplasm of upper-outer quadrant of right female breast: Secondary | ICD-10-CM | POA: Diagnosis present

## 2021-05-04 DIAGNOSIS — Z170 Estrogen receptor positive status [ER+]: Secondary | ICD-10-CM

## 2021-05-04 DIAGNOSIS — G894 Chronic pain syndrome: Secondary | ICD-10-CM | POA: Diagnosis present

## 2021-05-04 DIAGNOSIS — Z0289 Encounter for other administrative examinations: Secondary | ICD-10-CM

## 2021-05-04 DIAGNOSIS — E669 Obesity, unspecified: Secondary | ICD-10-CM

## 2021-05-04 MED ORDER — HYDROCODONE-ACETAMINOPHEN 5-325 MG PO TABS
1.0000 | ORAL_TABLET | Freq: Three times a day (TID) | ORAL | 0 refills | Status: DC | PRN
  Filled 2021-05-04: qty 90, 30d supply, fill #0

## 2021-05-04 MED ORDER — GABAPENTIN 300 MG PO CAPS
ORAL_CAPSULE | ORAL | 2 refills | Status: DC
  Filled 2021-05-04: qty 90, 30d supply, fill #0

## 2021-05-04 NOTE — Patient Instructions (Signed)
Sign pain contract.

## 2021-05-04 NOTE — Progress Notes (Signed)
PROVIDER NOTE: Information contained herein reflects review and annotations entered in association with encounter. Interpretation of such information and data should be left to medically-trained personnel. Information provided to patient can be located elsewhere in the medical record under "Patient Instructions". Document created using STT-dictation technology, any transcriptional errors that may result from process are unintentional.    Patient: Chelsea Hansen  Service Category: E/M  Provider: Gillis Santa, MD  DOB: Oct 18, 1970  DOS: 05/04/2021  Specialty: Interventional Pain Management  MRN: [id number]  Setting: Ambulatory outpatient  PCP: [name], NP  Type: Established Patient    Referring Provider: [name], NP  Location: Office  Delivery: Face-to-face     Primary Reason(s) for Visit: Encounter for evaluation before starting new chronic pain management plan of care (Level of risk: moderate) CC: Other (Reports that entire body is hurting.  Patient is undergoing chemo for breast cancer. )  HPI  Chelsea Hansen is a 50 y.o. year old, female patient, who comes today for a follow-up evaluation to review the test results and decide on a treatment plan. She has Syncope; Mediastinal adenopathy; Anemia; Chest pain; Asthma; Bradycardia; Hypokalemia; Malignant neoplasm of upper-outer quadrant of right breast in female, estrogen receptor positive ([place]); Malignant neoplasm of upper-outer quadrant of left breast in female, estrogen receptor positive ([place]); Family history of uterine cancer; Genetic testing; Port-A-Cath in place; Palpitations; Sinus bradycardia; Daytime somnolence; Snoring; Obesity (BMI 30-39.9); Pain management contract signed; and Chronic pain syndrome (breast cancer) on their problem list. Her primarily concern today is the Other (Reports that entire body is hurting.  Patient is undergoing chemo for breast cancer. )  Pain Assessment: Location: Other (Comment) Other (Comment) (all  over body discomfort) Radiating: denies Onset: More than a month ago Duration: Chronic pain Quality: Discomfort, Constant, Sharp, Dull, Stabbing Severity: 10-Worst pain ever/10 (subjective, self-reported pain score)  Effect on ADL: limiting.  unable to do what she needs to do. Timing: Constant Modifying factors: heating pad/ice no OTC medications help the pain. BP:    HR:    Chelsea Hansen comes in today for a follow-up visit after her initial evaluation on 04/23/2021. Today we went over the results of her tests. These were explained in "Layman's terms". During today's appointment we went over my diagnostic impression, as well as the proposed treatment plan.   Second patient visit today.  UDS up-to-date and appropriate.  Patient states that she would have headaches and nausea/vomiting with tramadol.  She continues chemotherapy weekly.  She is doing psychotherapy for depression and anxiety every 2 weeks.  She was started on gabapentin at 300 mg twice daily at her last visit.  She is not noticing any significant improvement with gabapentin.  She does have trouble sleeping.  I recommend that she increase her nighttime dose to 600 mg nightly and continue 34mlligrams during the day.  Given side effects with tramadol, will trial hydrocodone as below.  Risks and benefits reviewed with patient.  She will sign pain contract today.   HPI from initial clinic visit on 04/23/2021: Ms. BVeldhuizenis a pleasant 50year old female who unfortunately was diagnosed with breast cancer this past May.  She is undergoing chemotherapy.  She has a family history of breast cancer and states that her mother had a very young.  She has diffuse whole body pain that is most pronounced in her low back and hips.  She has tried acetaminophen and NSAIDs with limited response.  She was recently prescribed gabapentin at 300 mg which  she has yet to start.  She is also seeing a therapist to help with anxiety and depression associated with her  recent diagnosis of cancer.  She has tried tramadol in the past with limited response.  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   Controlled Substance Pharmacotherapy Assessment REMS (Risk Evaluation and Mitigation Strategy)  Hydrocodone 5 mg 3 times daily as needed, quantity 90, first prescription today. Pill Count: None expected due to no prior prescriptions written by our practice. No notes on file  Monitoring: [place] PMP: PDMP reviewed during this encounter. Online review of the past 63-monthperiod previously conducted. Not applicable at this point since we have not taken over the patient's medication management yet. List of other Serum/Urine Drug Screening Test(s):  Lab Results  Component Value Date   COCAINSCRNUR NONE DETECTED 12/31/2011   THCU NONE DETECTED 12/31/2011   ETH 153 (H) 04/30/2012   List of all UDS test(s) done:  Lab Results  Component Value Date   SUMMARY Note 04/08/2021   Last UDS on record: Summary  Date Value Ref Range Status  04/08/2021 Note  Final    Comment:    ==================================================================== Compliance Drug Analysis, Ur ==================================================================== Test                             Result       Flag       Units  Drug Present not Declared for Prescription Verification   Diphenhydramine                PRESENT      UNEXPECTED  Drug Absent but Declared for Prescription Verification   Gabapentin                     Not Detected UNEXPECTED   Citalopram                     Not Detected UNEXPECTED   Acetaminophen                  Not Detected UNEXPECTED    Acetaminophen, as indicated in the declared medication list, is not    always detected even when used as directed.  ==================================================================== Test                      Result    Flag   Units      Ref Range    Creatinine              133              mg/dL      >=20 ==================================================================== Declared Medications:  The flagging and interpretation on this report are based on the  following declared medications.  Unexpected results may arise from  inaccuracies in the declared medications.   **Note: The testing scope of this panel includes these medications:   Escitalopram (Lexapro)  Gabapentin (Neurontin)   **Note: The testing scope of this panel does not include small to  moderate amounts of these reported medications:   Acetaminophen (Tylenol)   **Note: The testing scope of this panel does not include the  following reported medications:   Albuterol (Ventolin HFA)  Amlodipine (Norvasc)  Fluticasone (Flonase)  Hydrochlorothiazide  Loratadine (Claritin)  Montelukast (Singulair)  Multivitamin  Omeprazole (Prilosec)  Vitamin D3 ==================================================================== For clinical consultation,  please call [phone]. ====================================================================    UDS interpretation: No unexpected findings.          Medication Assessment Form: Patient introduced to form today Treatment compliance: Treatment may start today if patient agrees with proposed plan. Evaluation of compliance is not applicable at this point Risk Assessment Profile: Aberrant behavior: See initial evaluations. None observed or detected today Comorbid factors increasing risk of overdose: See initial evaluation. No additional risks detected today Opioid risk tool (ORT):  Opioid Risk  04/08/2021  Alcohol 0  Illegal Drugs 0  Rx Drugs 0  Alcohol 0  Illegal Drugs 0  Rx Drugs 0  Age between 16-45 years  0  Psychological Disease 0  Depression 1  Opioid Risk Tool Scoring 1  Opioid Risk Interpretation Low Risk    ORT Scoring interpretation table:  Score <3 = Low Risk for SUD  Score between 4-7 = Moderate Risk  for SUD  Score >8 = High Risk for Opioid Abuse   Risk of substance use disorder (SUD): Low  Risk Mitigation Strategies:  Patient opioid safety counseling: Completed today. Counseling provided to patient as per "Patient Counseling Document". Document signed by patient, attesting to counseling and understanding Patient-Prescriber Agreement (PPA): Obtained today.  Controlled substance notification to other providers: Written and sent today.  Pharmacologic Plan: Today we may be taking over the patient's pharmacological regimen. See below.             Laboratory Chemistry Profile   Renal Lab Results  Component Value Date   BUN 12 04/28/2021   CREATININE 0.78 04/28/2021   BCR 15 10/06/2020   GFRAA >60 09/07/2019   GFRNONAA >60 04/28/2021   PROTEINUR 30 (A) 09/07/2019     Electrolytes Lab Results  Component Value Date   NA 140 04/28/2021   K 4.2 04/28/2021   CL 106 04/28/2021   CALCIUM 9.2 04/28/2021   MG 2.0 01/28/2021     Hepatic Lab Results  Component Value Date   AST 18 04/28/2021   ALT 25 04/28/2021   ALBUMIN 3.8 04/28/2021   ALKPHOS 98 04/28/2021   LIPASE 21 01/28/2021     ID Lab Results  Component Value Date   SARSCOV2NAA NEGATIVE 05/28/2020   STAPHAUREUS  08/11/2010    NEGATIVE        The Xpert SA Assay (FDA approved for NASAL specimens only), is one component of a comprehensive surveillance program.  It is not intended to diagnose infection nor to guide or monitor treatment.   MRSAPCR NEGATIVE 08/11/2010   PREGTESTUR NEGATIVE 01/02/2011     Bone No results found for: VD25OH, ER154MG8QPY, PP5093OI7, TI4580DX8, 25OHVITD1, 25OHVITD2, 25OHVITD3, TESTOFREE, TESTOSTERONE   Endocrine Lab Results  Component Value Date   GLUCOSE 106 (H) 04/28/2021   GLUCOSEU NEGATIVE 09/07/2019   TSH 1.130 03/25/2021   FREET4 0.93 04/01/2010     Neuropathy Lab Results  Component Value Date   VITAMINB12 783 12/31/2011   FOLATE 6.4 12/31/2011     CNS No results  found for: COLORCSF, APPEARCSF, RBCCOUNTCSF, WBCCSF, POLYSCSF, LYMPHSCSF, EOSCSF, PROTEINCSF, GLUCCSF, JCVIRUS, CSFOLI, IGGCSF, LABACHR, ACETBL, LABACHR, ACETBL   Inflammation (CRP: Acute  ESR: Chronic) Lab Results  Component Value Date   ESRSEDRATE 11 12/31/2011   LATICACIDVEN 0.87 11/01/2013     Rheumatology No results found for: RF, ANA, LABURIC, URICUR, LYMEIGGIGMAB, LYMEABIGMQN, HLAB27   Coagulation Lab Results  Component Value Date   INR 1.10 01/10/2012   LABPROT 14.4 01/10/2012   APTT 32 01/10/2012  PLT 356 04/28/2021   DDIMER 1.07 (H) 12/31/2011     Cardiovascular Lab Results  Component Value Date   CKTOTAL 96 01/01/2012   CKMB 1.2 01/01/2012   TROPONINI <0.30 01/01/2012   HGB 9.2 (L) 04/28/2021   HCT 29.5 (L) 04/28/2021     Screening Lab Results  Component Value Date   SARSCOV2NAA NEGATIVE 05/28/2020   STAPHAUREUS  08/11/2010    NEGATIVE        The Xpert SA Assay (FDA approved for NASAL specimens only), is one component of a comprehensive surveillance program.  It is not intended to diagnose infection nor to guide or monitor treatment.   MRSAPCR NEGATIVE 08/11/2010   PREGTESTUR NEGATIVE 01/02/2011     Cancer No results found for: CEA, CA125, LABCA2   Allergens No results found for: ALMOND, APPLE, ASPARAGUS, AVOCADO, BANANA, BARLEY, BASIL, BAYLEAF, GREENBEAN, LIMABEAN, WHITEBEAN, BEEFIGE, REDBEET, BLUEBERRY, BROCCOLI, CABBAGE, MELON, CARROT, CASEIN, CASHEWNUT, CAULIFLOWER, CELERY     Note: Lab results reviewed.   Meds   Current Outpatient Medications:    acetaminophen (TYLENOL) 500 MG tablet, Take 1 tablet by mouth 3 times daily as needed. Take with Aleve 220 mg, Disp: 90 tablet, Rfl: 0   albuterol (VENTOLIN HFA) 108 (90 Base) MCG/ACT inhaler, Inhale 2 puffs into the lungs every 6 (six) hours as needed for wheezing or shortness of breath., Disp: 18 g, Rfl: 2   amLODipine (NORVASC) 10 MG tablet, Take 1 tablet (10 mg total) by mouth daily., Disp: 90  tablet, Rfl: 1   Cholecalciferol (VITAMIN D3) 2000 units capsule, Take 2,000 Units by mouth daily., Disp: , Rfl: 11   dexamethasone (DECADRON) 4 MG tablet, Take 4 mg by mouth as needed. Given with chemo treatment., Disp: , Rfl:    diazepam (VALIUM) 5 MG tablet, Take 5 mg by mouth as needed., Disp: , Rfl:    escitalopram (LEXAPRO) 20 MG tablet, Take 1 tablet (20 mg total) by mouth daily., Disp: 90 tablet, Rfl: 1   fluticasone (FLONASE) 50 MCG/ACT nasal spray, Use 2 sprays in each nostril daily., Disp: 16 g, Rfl: 11   hydrochlorothiazide (HYDRODIURIL) 25 MG tablet, Take 1 tablet (25 mg total) by mouth daily., Disp: 90 tablet, Rfl: 1   HYDROcodone-acetaminophen (NORCO/VICODIN) 5-325 MG tablet, Take 1 tablet by mouth 3 (three) times daily as needed for severe pain. Must last 30 days., Disp: 90 tablet, Rfl: 0   lidocaine-prilocaine (EMLA) cream, Apply 1 application topically as needed., Disp: , Rfl:    montelukast (SINGULAIR) 10 MG tablet, Take 1 tablet (10 mg total) by mouth at bedtime., Disp: 90 tablet, Rfl: 3   ondansetron (ZOFRAN) 8 MG tablet, Take 8 mg by mouth as needed., Disp: , Rfl:    prochlorperazine (COMPAZINE) 10 MG tablet, Take 10 mg by mouth as needed., Disp: , Rfl:    promethazine (PHENERGAN) 25 MG suppository, Place 25 mg rectally as needed., Disp: , Rfl:    propranolol (INDERAL) 10 MG tablet, Take 1 tablet (10 mg total) by mouth 2 (two) times daily., Disp: 180 tablet, Rfl: 3   gabapentin (NEURONTIN) 300 MG capsule, Take 1 capsule by mouth daily and 2 capsules at bedtime, Disp: 90 capsule, Rfl: 2 No current facility-administered medications for this visit.  Facility-Administered Medications Ordered in Other Visits:    acetaminophen (TYLENOL) 500 MG tablet, , , ,    ondansetron (ZOFRAN) 4 MG/2ML injection, , , ,    ondansetron (ZOFRAN) 4 MG/2ML injection, , , ,    sodium chloride  flush (NS) 0.9 % injection 10 mL, 10 mL, Intracatheter, PRN, Magrinat, Virgie Dad, MD, 10 mL at 02/10/21  1716  ROS  Constitutional: Denies any fever or chills Gastrointestinal: No reported hemesis, hematochezia, vomiting, or acute GI distress Musculoskeletal: Denies any acute onset joint swelling, redness, loss of ROM, or weakness Neurological: No reported episodes of acute onset apraxia, aphasia, dysarthria, agnosia, amnesia, paralysis, loss of coordination, or loss of consciousness  Allergies  Ms. Funderburke has No Known Allergies.  PFSH  Drug: Ms. Slatten  reports no history of drug use. Alcohol:  reports that she does not currently use alcohol after a past usage of about 7.0 standard drinks per week. Tobacco:  reports that she has never smoked. She has never used smokeless tobacco. Medical:  has a past medical history of Allergy, Anxiety, Asthma, Breast cancer ([place]) (2012), Cancer ([place]) (2022), Depression, DVT (deep venous thrombosis) ([place]) (2010), Family history of uterine cancer, GERD (gastroesophageal reflux disease), Headache(784.0), Hyperlipidemia, and Hypertension. Surgical: Ms. Garrison  has a past surgical history that includes Abdominal hysterectomy; Breast surgery; Tubal ligation; Breast excisional biopsy (Right, 09/16/2009); and Portacath placement (Left, 01/26/2021). Family: family history includes Cancer in her maternal aunt, maternal aunt, and mother; Hypertension in her father; Uterine cancer (age of onset: 56) in her cousin.  Constitutional Exam  General appearance: Well nourished, well developed, and well hydrated. In no apparent acute distress There were no vitals filed for this visit. BMI Assessment: Estimated body mass index is 30.87 kg/m as calculated from the following:   Height as of 04/28/21: '5\' 11"'  (1.803 m).   Weight as of 04/28/21: 221 lb 4.8 oz (100.4 kg).  BMI interpretation table: BMI level Category Range association with higher incidence of chronic pain  <18 kg/m2 Underweight   18.5-24.9 kg/m2 Ideal body weight   25-29.9 kg/m2 Overweight Increased incidence by 20%   30-34.9 kg/m2 Obese (Class I) Increased incidence by 68%  35-39.9 kg/m2 Severe obesity (Class II) Increased incidence by 136%  >40 kg/m2 Extreme obesity (Class III) Increased incidence by 254%   Patient's current BMI Ideal Body weight  There is no height or weight on file to calculate BMI. Ideal body weight: 70.8 kg (156 lb 1.4 oz) Adjusted ideal body weight: 82.6 kg (182 lb 2.7 oz)   BMI Readings from Last 4 Encounters:  04/28/21 30.87 kg/m  04/21/21 30.46 kg/m  04/14/21 30.40 kg/m  04/08/21 30.88 kg/m   Wt Readings from Last 4 Encounters:  04/28/21 221 lb 4.8 oz (100.4 kg)  04/21/21 218 lb 6 oz (99.1 kg)  04/14/21 218 lb (98.9 kg)  04/08/21 221 lb 6.4 oz (100.4 kg)    Psych/Mental status: Alert, oriented x 3 (person, place, & time)       Eyes: PERLA Respiratory: No evidence of acute respiratory distress  Lumbar Spine Area Exam  Skin & Axial Inspection: No masses, redness, or swelling Alignment: Symmetrical Functional ROM: Pain restricted ROM       Stability: No instability detected Muscle Tone/Strength: Functionally intact. No obvious neuro-muscular anomalies detected. Sensory (Neurological): Musculoskeletal pain pattern   Gait & Posture Assessment  Ambulation: Unassisted Gait: Relatively normal for age and body habitus Posture: WNL  Lower Extremity Exam      Side: Right lower extremity   Side: Left lower extremity  Stability: No instability observed           Stability: No instability observed          Skin & Extremity Inspection: Skin color, temperature, and  hair growth are WNL. No peripheral edema or cyanosis. No masses, redness, swelling, asymmetry, or associated skin lesions. No contractures.   Skin & Extremity Inspection: Skin color, temperature, and hair growth are WNL. No peripheral edema or cyanosis. No masses, redness, swelling, asymmetry, or associated skin lesions. No contractures.  Functional ROM: Unrestricted ROM                   Functional ROM:  Unrestricted ROM                  Muscle Tone/Strength: Functionally intact. No obvious neuro-muscular anomalies detected.   Muscle Tone/Strength: Functionally intact. No obvious neuro-muscular anomalies detected.  Sensory (Neurological): Unimpaired         Sensory (Neurological): Unimpaired        DTR: Patellar: deferred today Achilles: deferred today Plantar: deferred today   DTR: Patellar: deferred today Achilles: deferred today Plantar: deferred today  Palpation: No palpable anomalies   Palpation: No palpable anomalies    Assessment & Plan  Primary Diagnosis & Pertinent Problem List: The primary encounter diagnosis was Chronic pain syndrome (breast cancer). Diagnoses of Pain management contract signed, Malignant neoplasm of upper-outer quadrant of right breast in female, estrogen receptor positive ([place]), Malignant neoplasm of upper-outer quadrant of left breast in female, estrogen receptor positive ([place]), and Obesity (BMI 30-39.9) were also pertinent to this visit.  Visit Diagnosis: 1. Chronic pain syndrome (breast cancer)   2. Pain management contract signed   3. Malignant neoplasm of upper-outer quadrant of right breast in female, estrogen receptor positive ([place])   4. Malignant neoplasm of upper-outer quadrant of left breast in female, estrogen receptor positive ([place])   5. Obesity (BMI 30-39.9)     Plan of Care  Pharmacotherapy (Medications Ordered): Meds ordered this encounter  Medications   HYDROcodone-acetaminophen (NORCO/VICODIN) 5-325 MG tablet    Sig: Take 1 tablet by mouth 3 (three) times daily as needed for severe pain. Must last 30 days.    Dispense:  90 tablet    Refill:  0    Chronic Pain: STOP Act (Not applicable) Fill 1 day early if closed on refill date. Avoid benzodiazepines within 8 hours of opioids   gabapentin (NEURONTIN) 300 MG capsule    Sig: Take 1 capsule by mouth daily and 2 capsules at bedtime    Dispense:  90 capsule    Refill:  2       Pharmacological management options:  Opioid Analgesics: We'll take over management today. See above orders.  Consider buprenorphine for chronic pain management. Membrane stabilizer: Currently on a membrane stabilizer increase nighttime dose to 600 nightly.    I spent a total of 30 minutes reviewing chart data, face-to-face evaluation with the patient, counseling and coordination of care as detailed above.   Provider-requested follow-up: Return in about 31 days (around 06/04/2021) for Medication Management, in person. Recent Visits Date Type Provider Dept  04/08/21 Office Visit Gillis Santa, MD [facility]  Showing recent visits within past 90 days and meeting all other requirements Today's Visits Date Type Provider Dept  05/04/21 Office Visit Gillis Santa, MD [facility]  Showing today's visits and meeting all other requirements Future Appointments Date Type Provider Dept  06/03/21 Appointment Gillis Santa, MD [facility]  Showing future appointments within next 90 days and meeting all other requirements Primary Care Physician: [name], NP Note by: Gillis Santa, MD Date: 05/04/2021; Time: 1:31 PM

## 2021-05-05 ENCOUNTER — Inpatient Hospital Stay: Payer: Medicaid Other

## 2021-05-05 ENCOUNTER — Other Ambulatory Visit (HOSPITAL_COMMUNITY): Payer: Self-pay

## 2021-05-05 VITALS — BP 135/82 | HR 66 | Temp 98.1°F | Resp 20 | Wt 220.8 lb

## 2021-05-05 DIAGNOSIS — Z5111 Encounter for antineoplastic chemotherapy: Secondary | ICD-10-CM | POA: Diagnosis not present

## 2021-05-05 DIAGNOSIS — Z95828 Presence of other vascular implants and grafts: Secondary | ICD-10-CM

## 2021-05-05 DIAGNOSIS — Z170 Estrogen receptor positive status [ER+]: Secondary | ICD-10-CM

## 2021-05-05 DIAGNOSIS — C50411 Malignant neoplasm of upper-outer quadrant of right female breast: Secondary | ICD-10-CM

## 2021-05-05 DIAGNOSIS — C50412 Malignant neoplasm of upper-outer quadrant of left female breast: Secondary | ICD-10-CM

## 2021-05-05 LAB — CBC WITH DIFFERENTIAL/PLATELET
Abs Immature Granulocytes: 0.04 10*3/uL (ref 0.00–0.07)
Basophils Absolute: 0 10*3/uL (ref 0.0–0.1)
Basophils Relative: 1 %
Eosinophils Absolute: 0.1 10*3/uL (ref 0.0–0.5)
Eosinophils Relative: 2 %
HCT: 28.6 % — ABNORMAL LOW (ref 36.0–46.0)
Hemoglobin: 9 g/dL — ABNORMAL LOW (ref 12.0–15.0)
Immature Granulocytes: 1 %
Lymphocytes Relative: 26 %
Lymphs Abs: 1.2 10*3/uL (ref 0.7–4.0)
MCH: 25.2 pg — ABNORMAL LOW (ref 26.0–34.0)
MCHC: 31.5 g/dL (ref 30.0–36.0)
MCV: 80.1 fL (ref 80.0–100.0)
Monocytes Absolute: 0.3 10*3/uL (ref 0.1–1.0)
Monocytes Relative: 6 %
Neutro Abs: 3 10*3/uL (ref 1.7–7.7)
Neutrophils Relative %: 64 %
Platelets: 359 10*3/uL (ref 150–400)
RBC: 3.57 MIL/uL — ABNORMAL LOW (ref 3.87–5.11)
RDW: 20.4 % — ABNORMAL HIGH (ref 11.5–15.5)
WBC: 4.7 10*3/uL (ref 4.0–10.5)
nRBC: 0 % (ref 0.0–0.2)

## 2021-05-05 LAB — COMPREHENSIVE METABOLIC PANEL
ALT: 18 U/L (ref 0–44)
AST: 18 U/L (ref 15–41)
Albumin: 3.6 g/dL (ref 3.5–5.0)
Alkaline Phosphatase: 93 U/L (ref 38–126)
Anion gap: 10 (ref 5–15)
BUN: 13 mg/dL (ref 6–20)
CO2: 22 mmol/L (ref 22–32)
Calcium: 8.9 mg/dL (ref 8.9–10.3)
Chloride: 107 mmol/L (ref 98–111)
Creatinine, Ser: 0.78 mg/dL (ref 0.44–1.00)
GFR, Estimated: >60 mL/min (ref >60)
Glucose, Bld: 137 mg/dL — ABNORMAL HIGH (ref 70–99)
Potassium: 3.6 mmol/L (ref 3.5–5.1)
Sodium: 139 mmol/L (ref 135–145)
Total Bilirubin: <0.2 mg/dL — ABNORMAL LOW (ref 0.3–1.2)
Total Protein: 6.7 g/dL (ref 6.5–8.1)

## 2021-05-05 MED ORDER — SODIUM CHLORIDE 0.9 % IV SOLN
80.0000 mg/m2 | Freq: Once | INTRAVENOUS | Status: AC
  Administered 2021-05-05: 180 mg via INTRAVENOUS
  Filled 2021-05-05: qty 30

## 2021-05-05 MED ORDER — SODIUM CHLORIDE 0.9 % IV SOLN: Freq: Once | INTRAVENOUS | Status: AC

## 2021-05-05 MED ORDER — DEXAMETHASONE SODIUM PHOSPHATE 10 MG/ML IJ SOLN
4.0000 mg | Freq: Once | INTRAMUSCULAR | Status: AC
  Administered 2021-05-05: 4 mg via INTRAVENOUS
  Filled 2021-05-05: qty 1

## 2021-05-05 MED ORDER — SODIUM CHLORIDE 0.9% FLUSH
10.0000 mL | Freq: Once | INTRAVENOUS | Status: AC
  Administered 2021-05-05: 10 mL

## 2021-05-05 MED ORDER — SODIUM CHLORIDE 0.9% FLUSH
10.0000 mL | INTRAVENOUS | Status: DC | PRN
  Administered 2021-05-05: 10 mL

## 2021-05-05 MED ORDER — FAMOTIDINE 20 MG IN NS 100 ML IVPB
20.0000 mg | Freq: Once | INTRAVENOUS | Status: AC
  Administered 2021-05-05: 20 mg via INTRAVENOUS
  Filled 2021-05-05: qty 100

## 2021-05-05 MED ORDER — HEPARIN SOD (PORK) LOCK FLUSH 100 UNIT/ML IV SOLN
500.0000 [IU] | Freq: Once | INTRAVENOUS | Status: AC | PRN
  Administered 2021-05-05: 500 [IU]

## 2021-05-05 MED ORDER — DIPHENHYDRAMINE HCL 50 MG/ML IJ SOLN
25.0000 mg | Freq: Once | INTRAMUSCULAR | Status: AC
  Administered 2021-05-05: 25 mg via INTRAVENOUS
  Filled 2021-05-05: qty 1

## 2021-05-05 NOTE — Patient Instructions (Signed)
[facility]  Discharge Instructions: Thank you for choosing [facility] to provide your oncology and hematology care.   If you have a lab appointment with the Cancer Center, please go directly to the Cancer Center and check in at the registration area.   Wear comfortable clothing and clothing appropriate for easy access to any Portacath or PICC line.   We strive to give you quality time with your provider. You may need to reschedule your appointment if you arrive late (15 or more minutes).  Arriving late affects you and other patients whose appointments are after yours.  Also, if you miss three or more appointments without notifying the office, you may be dismissed from the clinic at the provider's discretion.      For prescription refill requests, have your pharmacy contact our office and allow 72 hours for refills to be completed.    Today you received the following chemotherapy and/or immunotherapy agent: Paclitaxel (Taxol)   To help prevent nausea and vomiting after your treatment, we encourage you to take your nausea medication as directed.  BELOW ARE SYMPTOMS THAT SHOULD BE REPORTED IMMEDIATELY: *FEVER GREATER THAN 100.4 F (38 C) OR HIGHER *CHILLS OR SWEATING *NAUSEA AND VOMITING THAT IS NOT CONTROLLED WITH YOUR NAUSEA MEDICATION *UNUSUAL SHORTNESS OF BREATH *UNUSUAL BRUISING OR BLEEDING *URINARY PROBLEMS (pain or burning when urinating, or frequent urination) *BOWEL PROBLEMS (unusual diarrhea, constipation, pain near the anus) TENDERNESS IN MOUTH AND THROAT WITH OR WITHOUT PRESENCE OF ULCERS (sore throat, sores in mouth, or a toothache) UNUSUAL RASH, SWELLING OR PAIN  UNUSUAL VAGINAL DISCHARGE OR ITCHING   Items with * indicate a potential emergency and should be followed up as soon as possible or go to the Emergency Department if any problems should occur.  Please show the CHEMOTHERAPY ALERT CARD or IMMUNOTHERAPY ALERT CARD at  check-in to the Emergency Department and triage nurse.  Should you have questions after your visit or need to cancel or reschedule your appointment, please contact [facility]  Dept: [phone]  and follow the prompts.  Office hours are 8:00 a.m. to 4:30 p.m. Monday - Friday. Please note that voicemails left after 4:00 p.m. may not be returned until the following business day.  We are closed weekends and major holidays. You have access to a nurse at all times for urgent questions. Please call the main number to the clinic Dept: [phone] and follow the prompts.   For any non-urgent questions, you may also contact your provider using MyChart. We now offer e-Visits for anyone 18 and older to request care online for non-urgent symptoms. For details visit [email or web address].   Also download the MyChart app! Go to the app store, search "MyChart", open the app, select [facility], and log in with your MyChart username and password.  Due to Covid, a mask is required upon entering the hospital/clinic. If you do not have a mask, one will be given to you upon arrival. For doctor visits, patients may have 1 support person aged 18 or older with them. For treatment visits, patients cannot have anyone with them due to current Covid guidelines and our immunocompromised population.  

## 2021-05-06 ENCOUNTER — Ambulatory Visit (HOSPITAL_BASED_OUTPATIENT_CLINIC_OR_DEPARTMENT_OTHER): Payer: Medicaid Other | Admitting: Cardiovascular Disease

## 2021-05-06 ENCOUNTER — Other Ambulatory Visit: Payer: Self-pay

## 2021-05-06 DIAGNOSIS — R400 Somnolence: Secondary | ICD-10-CM

## 2021-05-06 DIAGNOSIS — R0683 Snoring: Secondary | ICD-10-CM

## 2021-05-07 ENCOUNTER — Other Ambulatory Visit (HOSPITAL_COMMUNITY): Payer: Self-pay

## 2021-05-07 ENCOUNTER — Encounter: Payer: Self-pay | Admitting: *Deleted

## 2021-05-07 ENCOUNTER — Encounter: Payer: Medicaid Other | Admitting: Student in an Organized Health Care Education/Training Program

## 2021-05-11 NOTE — Progress Notes (Signed)
[place]  [phone] [phone]    ID: Chelsea Hansen DOB: [birth year]  MR#: [id number]  EOF#:[id number]  Patient Care Team: Chelsea Perna, NP as PCP - General (Internal Medicine) Chelsea Salines, DO as PCP - Cardiology (Cardiology) Chelsea Germany, RN as Oncology Nurse Navigator Chelsea Kaufmann, RN as Oncology Nurse Navigator Chelsea Pray, MD as Consulting Physician (Radiation Oncology) Chelsea Hansen, Chelsea Dad, MD as Consulting Physician (Oncology) Chelsea Keens, MD as Consulting Physician (General Surgery) Chelsea Cruel, MD OTHER MD:  CHIEF COMPLAINT: Bilateral estrogen receptor positive breast cancer  CURRENT TREATMENT:  neoadjuvant chemotherapy   INTERVAL HISTORY: Chelsea Hansen returns today for follow up and treatment of her bilateral breast cancer.   She continues on weekly paclitaxel. Today is week 8 of 12 planned doses. Chelsea Hansen had been tolerating this well as per our last note, but currently is having a variety of problems.  She tells me she has chronic pain in her hips all the way down to her feet.  Her big toe and second toe in both feet is numb and hurts and the bottom of the feet hurt.  She has no problems with her hands.  She says she fell last week, for no particular reason.  She does not think she injured herself.  She is scheduled for posttreatment breast MRI on 06/02/2021.  She is scheduled for follow-up with Dr. Ninfa Hansen 06/05/2021.  Recall that Chelsea Hansen has booked a trip to [place] from 06/10/2021 through 06/26/2021.  She wants to make sure she has her surgery appointments and imaging scheduled beforehand.     REVIEW OF SYSTEMS: Chelsea Hansen sought a pain physician 05/04/2021, for a chronic pain syndrome.  She tells me there is no way she can work as her body is "shutting down".  The pain she says is from her hips to her feet and separate from that she has numbness and pain in her big toes and the soles of her feet.  The pain in her  feet "is killing me".  She is on gabapentin apparently without much relief.  She says her heart has been fluttering, she has been started on medicine for that and also has been scheduled for a sleep study.  She tells me her disability application was sent in August and she still has not heard.   COVID 19 VACCINATION STATUS: Chelsea Hansen x2, most recently 10/2020, no booster as of June 2022   HISTORY OF CURRENT ILLNESS: From the original intake note:  Chelsea Hansen has a history of excisional biopsy of a fibroadenoma from the lower outer right breast in 2011.  More recently she noted a knot in her right breast.  She brought it to medical attention and proceeded to bilateral diagnostic mammography with tomography and bilateral breast ultrasonography at [facility] on 11/18/2020 showing: breast density category B;   RIGHT: 3.2 cm mass associated with architectural distortion and scattered calcifications in upper-outer right breast at 10 o'clock, corresponding to palpable concern; indeterminate 1.1 cm mass in upper-outer right breast at 9:30; solitary pathologic right axillary lymph node with eccentric cortical thickening up to 7 mm. LEFT: 0.8 cm mass involving upper-outer left breast at 2 o'clock; no pathologic left axillary lymphadenopathy.  Accordingly on 12/03/2020 she proceeded to biopsy of the bilateral breast areas in question. The pathology from this procedure ([id number]) showed:  Right Breast, 9:30 fibroadenoma Right Breast, 10 o'clock Invasive and in situ mammary carcinoma, e-cadherin negative, grade 2 Prognostic indicators significant for: estrogen  receptor, 95% positive and progesterone receptor, 100% positive, both with strong staining intensity. Proliferation marker Ki67 at 40%. HER2 negative by immunohistochemistry (0). 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 Grade or-2  Prognostic indicators significant for: estrogen receptor, 95% positive and progesterone receptor, 100% positive, both with strong staining intensity. Proliferation marker Ki67 at 15%. HER2 negative by immunohistochemistry (1+).   Cancer Staging  Malignant neoplasm of upper-outer quadrant of left breast in female, estrogen receptor positive (Chelsea Hansen) Staging form: Breast, AJCC 8th Edition - Clinical stage from 12/17/2020: cT1b, cN0, cM0, ER+, PR+, HER2- - Signed by Chelsea Cruel, MD on 12/17/2020 Stage prefix: Initial diagnosis Laterality: Left Staged by: Pathologist and managing physician Stage used in treatment planning: Yes National guidelines used in treatment planning: Yes Type of national guideline used in treatment planning: NCCN  Malignant neoplasm of upper-outer quadrant of right breast in female, estrogen receptor positive (Chelsea Hansen) Staging form: Breast, AJCC 8th Edition - Clinical stage from 12/17/2020: cT2, cN1, cM0, ER+, PR+, HER2- - Signed by Chelsea Cruel, MD on 12/17/2020 Stage prefix: Initial diagnosis Laterality: Right Staged by: Pathologist and managing physician Stage used in treatment planning: Yes National guidelines used in treatment planning: Yes Type of national guideline used in treatment planning: NCCN  The patient's subsequent history is as detailed below.   PAST MEDICAL HISTORY: Past Medical History:  Diagnosis Date   Allergy    seasonal allergies   Anxiety    on meds   Asthma    uses inhaler   Breast cancer ([place]) 2012   RIGHT lumpectomy   Cancer ([place]) 2022   RIGHT breast lump-dx 2022   Depression    on meds   DVT (deep venous thrombosis) ([place]) 2010   after hysterectomy   Family history of uterine cancer    GERD (gastroesophageal reflux disease)    with certain foods/OTC PRN meds   Headache(784.0)    Hyperlipidemia    on meds   Hypertension    on meds    PAST SURGICAL  HISTORY: Past Surgical History:  Procedure Laterality Date   ABDOMINAL HYSTERECTOMY     BREAST EXCISIONAL BIOPSY Right 09/16/2009   BREAST SURGERY     lumpectomy   PORTACATH PLACEMENT Left 01/26/2021   Procedure: INSERTION PORT-A-CATH;  Surgeon: Chelsea Keens, MD;  Location: [facility];  Service: General;  Laterality: Left;   TUBAL LIGATION    Patient's ovaries are still in place, status post hysterectomy 08/18/2010 with benign pathology   FAMILY HISTORY: Family History  Problem Relation Age of Onset   Cancer Mother        ?, dx <50   Hypertension Father    Cancer Maternal Aunt        uterine vs ovarian, dx <50   Cancer Maternal Aunt        unknown type, dx <50   Uterine cancer Cousin 88   Colon polyps Neg Hx    Colon cancer Neg Hx    Esophageal cancer Neg Hx    Stomach cancer Neg Hx    Her father is age 97 and her mother age 81 as of 11/2020. Her mother has a history of cancer (type unsure).  The patient's mother had two sisters also with cancer, one with uterine and the other cancer type is unknown to the patient. Azya has 1 brother and  1 sister.    GYNECOLOGIC HISTORY:  No LMP recorded. Patient has had a hysterectomy. Menarche: 50 years old Age at first live birth: 50 years old [place] P 5 LMP 2012, with hysterectomy Contraceptive never used HRT never used  Hysterectomy? Yes, 07/2010 for fibroids (pathology in Epic) BSO? 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 She is single and lives at home with her three sons. Her two daughters also live here in [place]. Daughter Aala Ransom, age 68, works at [employer]. Daughter Jilda Panda Sample, age 28, and son Isiah Nils Pyle, age 27, work at [employer]. Son Sharlee Blew, age 25, and daughter Alvera Novel, age 63, are currently not employed. 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    ADVANCED DIRECTIVES: not in place   HEALTH MAINTENANCE: Social History   Tobacco Use   Smoking status:  Never   Smokeless tobacco: Never  Vaping Use   Vaping Use: Never used  Substance Use Topics   Alcohol use: Not Currently    Alcohol/week: 7.0 standard drinks    Types: 7 Standard drinks or equivalent per week    Comment: occassional   Drug use: No     Colonoscopy: 10/2020 (Dr. Fuller Plan), recall 2027  PAP: 05/2010, negative, repeat not indicated (s/p hysterectomy)  Bone density: n/a (age)   No Known Allergies  Current Outpatient Medications  Medication Sig Dispense Refill   acetaminophen (TYLENOL) 500 MG tablet Take 1 tablet by mouth 3 times daily as needed. Take with Aleve 220 mg 90 tablet 0   albuterol (VENTOLIN HFA) 108 (90 Base) MCG/ACT inhaler Inhale 2 puffs into the lungs every 6 (six) hours as needed for wheezing or shortness of breath. 18 g 2   amLODipine (NORVASC) 10 MG tablet Take 1 tablet (10 mg total) by mouth daily. 90 tablet 1   Cholecalciferol (VITAMIN D3) 2000 units capsule Take 2,000 Units by mouth daily.  11   dexamethasone (DECADRON) 4 MG tablet Take 4 mg by mouth as needed. Given with chemo treatment.     diazepam (VALIUM) 5 MG tablet Take 5 mg by mouth as needed.     escitalopram (LEXAPRO) 20 MG tablet Take 1 tablet (20 mg total) by mouth daily. 90 tablet 1   fluticasone (FLONASE) 50 MCG/ACT nasal spray Use 2 sprays in each nostril daily. 16 g 11   gabapentin (NEURONTIN) 300 MG capsule Take 1 capsule by mouth daily and 2 capsules at bedtime 90 capsule 2   hydrochlorothiazide (HYDRODIURIL) 25 MG tablet Take 1 tablet (25 mg total) by mouth daily. 90 tablet 1   HYDROcodone-acetaminophen (NORCO/VICODIN) 5-325 MG tablet Take 1 tablet by mouth 3 (three) times daily as needed for severe pain. Must last 30 days. 90 tablet 0   lidocaine-prilocaine (EMLA) cream Apply 1 application topically as needed.     montelukast (SINGULAIR) 10 MG tablet Take 1 tablet (10 mg total) by mouth at bedtime. 90 tablet 3   ondansetron (ZOFRAN) 8 MG tablet Take 8 mg by mouth as needed.      prochlorperazine (COMPAZINE) 10 MG tablet Take 10 mg by mouth as needed.     promethazine (PHENERGAN) 25 MG suppository Place 25 mg rectally as needed.     propranolol (INDERAL) 10 MG tablet Take 1 tablet (10 mg total) by mouth 2 (two) times daily. 180 tablet 3   No current facility-administered medications for this visit.   Facility-Administered Medications Ordered in Other Visits  Medication Dose Route Frequency  Provider Last Rate Last Admin   acetaminophen (TYLENOL) 500 MG tablet            ondansetron (ZOFRAN) 4 MG/2ML injection            ondansetron (ZOFRAN) 4 MG/2ML injection            sodium chloride flush (NS) 0.9 % injection 10 mL  10 mL Intracatheter PRN Javanni Maring, Chelsea Dad, MD   10 mL at 02/10/21 1716    OBJECTIVE: African-American woman in a wheelchair today  Vitals:   05/12/21 0820  BP: (!) 149/84  Pulse: 79  Resp: 18  Temp: 98.1 F (36.7 C)  SpO2: 100%      Body mass index is 30.78 kg/m.   Wt Readings from Last 3 Encounters:  05/12/21 220 lb 11.2 oz (100.1 kg)  05/06/21 220 lb (99.8 kg)  05/05/21 220 lb 12 oz (100.1 kg)     ECOG FS:1 - Symptomatic but completely ambulatory  Sclerae unicteric, EOMs intact Wearing a mask No cervical or supraclavicular adenopathy Lungs no rales or rhonchi Heart regular rate and rhythm Abd soft, nontender, positive bowel sounds MSK no focal spinal tenderness, no upper extremity lymphedema Neuro: nonfocal, well oriented, appropriate affect Breasts: There is still a palpable area of induration in the right breast, no erythema or nipple retraction.  This area does seem significantly smaller than prior and may represent scar tissue.  The right axilla shows no adenopathy.  The left breast and left axilla show no mass and no skin or nipple changes of concern.   LAB RESULTS:  CMP     Component Value Date/Time   NA 139 05/05/2021 0811   NA 139 10/06/2020 1432   K 3.6 05/05/2021 0811   CL 107 05/05/2021 0811   CO2 22 05/05/2021  0811   GLUCOSE 137 (H) 05/05/2021 0811   BUN 13 05/05/2021 0811   BUN 11 10/06/2020 1432   CREATININE 0.78 05/05/2021 0811   CREATININE 0.70 03/02/2021 1533   CALCIUM 8.9 05/05/2021 0811   PROT 6.7 05/05/2021 0811   PROT 6.9 10/06/2020 1432   ALBUMIN 3.6 05/05/2021 0811   ALBUMIN 4.1 10/06/2020 1432   AST 18 05/05/2021 0811   AST 14 (L) 03/02/2021 1533   ALT 18 05/05/2021 0811   ALT 14 03/02/2021 1533   ALKPHOS 93 05/05/2021 0811   BILITOT <0.2 (L) 05/05/2021 0811   BILITOT 0.3 03/02/2021 1533   GFRNONAA >60 05/05/2021 0811   GFRNONAA >60 03/02/2021 1533   GFRAA >60 09/07/2019 1340    Lab Results  Component Value Date   WBC 5.2 05/12/2021   NEUTROABS 3.1 05/12/2021   HGB 9.2 (L) 05/12/2021   HCT 30.0 (L) 05/12/2021   MCV 81.1 05/12/2021   PLT 355 05/12/2021     Lab Results  Component Value Date   CA2729 66.9 (H) 02/10/2021    STUDIES: LONG TERM MONITOR (3-14 DAYS)  Result Date: 04/22/2021 Patch Wear Time:  13 days and 20 hours starting March 28, 2021 Indication: Palpitations Patient had a min HR of 54 bpm, max HR of 214 bpm, and avg HR of 89 bpm. Predominant underlying rhythm was Sinus Rhythm. 5 Supraventricular Tachycardia runs occurred, the run with the fastest interval lasting 11 beats with a max rate of 214 bpm, the longest lasting 8 beats with an avg rate of 142 bpm. Premature atrial complexes were rare (<1.0%). Premature ventricular complexes were rare (<1.0%). No symptoms reported. Conclusion: This study is remarkable for paroxysmal  supraventricular tachycardia which is likely atrial tachycardia with variable block.    ELIGIBLE FOR AVAILABLE RESEARCH PROTOCOL: no  ASSESSMENT: 50 y.o. [place] woman status post bilateral breast biopsies 12/03/2020, showing  (1) on the right, a clinical T2 N1, stage IIa invasive lobular carcinoma, grade 2, estrogen and progesterone receptor strongly positive, HER2 not amplified, with an MIB-1 of 40%   (a) the biopsied right  axillary lymph node was positive with extracapsular extension   (b) a second right breast mass also biopsied was a fibroadenoma, concordant   (c) biopsy of an area of non-mass-like enhancement in the upper right breast pending   (2) on the left, a clinical T1b N0, stage IA invasive ductal carcinoma, grade 1 or 2, estrogen and progesterone receptor positive, HER2 not amplified, with an MIB 1 of 15%   (a) biopsy of a 0.4 cm enhancement in the medial left breast pending  (3) genetics testing through the [facility] BRCAplus panel (report date 12/24/2020) or the CancerNext-Expanded + RNAinsight panel (report date 12/31/2020) found no deleterious mutations in ATM, BRCA1, BRCA2, CDH1, CHEK2, PALB2, PTEN, and TP53. The CancerNext-Expanded + RNAinsight gene panel offered by [employer] and includes sequencing and rearrangement analysis for the following 77 genes: AIP, ALK, APC, ATM, AXIN2, BAP1, BARD1, BLM, BMPR1A, BRCA1, BRCA2, BRIP1, CDC73, CDH1, CDK4, CDKN1B, CDKN2A, CHEK2, CTNNA1, DICER1, FANCC, FH, FLCN, GALNT12, KIF1B, LZTR1, MAX, MEN1, MET, MLH1, MSH2, MSH3, MSH6, MUTYH, NBN, NF1, NF2, NTHL1, PALB2, PHOX2B, PMS2, POT1, PRKAR1A, PTCH1, PTEN, RAD51C, RAD51D, RB1, RECQL, RET, SDHA, SDHAF2, SDHB, SDHC, SDHD, SMAD4, SMARCA4, SMARCB1, SMARCE1, STK11, SUFU, TMEM127, TP53, TSC1, TSC2, VHL and XRCC2 (sequencing and deletion/duplication); EGFR, EGLN1, HOXB13, KIT, MITF, PDGFRA, POLD1 and POLE (sequencing only); EPCAM and GREM1 (deletion/duplication only). RNA data is routinely analyzed for use in variant interpretation for all genes.  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  (5) definitive surgery to follow chemotherapy  (6) chemotherapy consisting of doxorubicin and cyclophosphamide in dose dense fashion x4  started 01/27/2021, completed on 03/10/2021,followed by weekly paclitaxel with 12 doses planned  (7) adjuvant radiation on the right, possibly also on the left  (8) antiestrogens to follow at the completion of local treatment  (9) oral mass noted at time of intubation for port placement:  (A) CT head and neck 02/02/2021 negative  (B) ENT evaluation Dr. Kandis Cocking 02/06/2021 benign note from her February 06 2021 visit notes mild mucositis, thrush, and reflux.  When we see Janeal again we will start her on a PPI and Diflucan for thrush   PLAN: Aahana completed her intense chemotherapy with cyclophosphamide and doxorubicin and received 7 doses of paclitaxel.  Some of the symptoms she is experiencing I do not think are related to the chemotherapy but the numbness and pain in the first 2 toes of both feet as well as problems with the soles of her feet very likely represent neuropathy.  Likely she does not have similar symptoms in her hands.  Accordingly we are stopping chemotherapy today.    She is very distressed by this decision.  She is very motivated to continue.  I am asking her to come back next week and as if her neuropathy in the feet has completely resolved we will proceed to treatment that day but more likely we will decide on no further chemotherapy  at that point.  She is already scheduled for MRI of the breast 06/02/2021 and to follow with a visit same day.  Total encounter time 25 minutes.Sarajane Jews C. Aryiah Monterosso, MD 05/12/21 8:26 AM Medical Oncology and Hematology [facility] [place], [place] [address] Tel. [phone]    Fax. [phone]   I, Wilburn Mylar, am acting as scribe for Dr. Virgie Hansen. Caree Wolpert.  I, Lurline Del MD, have reviewed the above documentation for accuracy and completeness, and I agree with the above.   *Total Encounter Time as defined by the Centers for Medicare and Medicaid Services includes, in addition to the  face-to-face time of a patient visit (documented in the note above) non-face-to-face time: obtaining and reviewing outside history, ordering and reviewing medications, tests or procedures, care coordination (communications with other health care professionals or caregivers) and documentation in the medical record.

## 2021-05-12 ENCOUNTER — Other Ambulatory Visit: Payer: Self-pay

## 2021-05-12 ENCOUNTER — Inpatient Hospital Stay: Payer: Medicaid Other

## 2021-05-12 ENCOUNTER — Ambulatory Visit: Payer: Medicaid Other | Admitting: Student in an Organized Health Care Education/Training Program

## 2021-05-12 ENCOUNTER — Inpatient Hospital Stay (HOSPITAL_BASED_OUTPATIENT_CLINIC_OR_DEPARTMENT_OTHER): Payer: Medicaid Other | Admitting: Oncology

## 2021-05-12 VITALS — BP 149/84 | HR 79 | Temp 98.1°F | Resp 18 | Ht 71.0 in | Wt 220.7 lb

## 2021-05-12 DIAGNOSIS — Z170 Estrogen receptor positive status [ER+]: Secondary | ICD-10-CM | POA: Diagnosis not present

## 2021-05-12 DIAGNOSIS — C50411 Malignant neoplasm of upper-outer quadrant of right female breast: Secondary | ICD-10-CM

## 2021-05-12 DIAGNOSIS — Z95828 Presence of other vascular implants and grafts: Secondary | ICD-10-CM

## 2021-05-12 DIAGNOSIS — C50412 Malignant neoplasm of upper-outer quadrant of left female breast: Secondary | ICD-10-CM

## 2021-05-12 DIAGNOSIS — Z5111 Encounter for antineoplastic chemotherapy: Secondary | ICD-10-CM | POA: Diagnosis not present

## 2021-05-12 LAB — CBC WITH DIFFERENTIAL/PLATELET
Abs Immature Granulocytes: 0.03 10*3/uL (ref 0.00–0.07)
Basophils Absolute: 0.1 10*3/uL (ref 0.0–0.1)
Basophils Relative: 1 %
Eosinophils Absolute: 0.1 10*3/uL (ref 0.0–0.5)
Eosinophils Relative: 2 %
HCT: 30 % — ABNORMAL LOW (ref 36.0–46.0)
Hemoglobin: 9.2 g/dL — ABNORMAL LOW (ref 12.0–15.0)
Immature Granulocytes: 1 %
Lymphocytes Relative: 29 %
Lymphs Abs: 1.5 10*3/uL (ref 0.7–4.0)
MCH: 24.9 pg — ABNORMAL LOW (ref 26.0–34.0)
MCHC: 30.7 g/dL (ref 30.0–36.0)
MCV: 81.1 fL (ref 80.0–100.0)
Monocytes Absolute: 0.4 10*3/uL (ref 0.1–1.0)
Monocytes Relative: 8 %
Neutro Abs: 3.1 10*3/uL (ref 1.7–7.7)
Neutrophils Relative %: 59 %
Platelets: 355 10*3/uL (ref 150–400)
RBC: 3.7 MIL/uL — ABNORMAL LOW (ref 3.87–5.11)
RDW: 19.3 % — ABNORMAL HIGH (ref 11.5–15.5)
WBC: 5.2 10*3/uL (ref 4.0–10.5)
nRBC: 0 % (ref 0.0–0.2)

## 2021-05-12 LAB — COMPREHENSIVE METABOLIC PANEL
ALT: 20 U/L (ref 0–44)
AST: 18 U/L (ref 15–41)
Albumin: 3.8 g/dL (ref 3.5–5.0)
Alkaline Phosphatase: 98 U/L (ref 38–126)
Anion gap: 11 (ref 5–15)
BUN: 15 mg/dL (ref 6–20)
CO2: 24 mmol/L (ref 22–32)
Calcium: 9.4 mg/dL (ref 8.9–10.3)
Chloride: 106 mmol/L (ref 98–111)
Creatinine, Ser: 0.79 mg/dL (ref 0.44–1.00)
GFR, Estimated: >60 mL/min (ref >60)
Glucose, Bld: 125 mg/dL — ABNORMAL HIGH (ref 70–99)
Potassium: 4 mmol/L (ref 3.5–5.1)
Sodium: 141 mmol/L (ref 135–145)
Total Bilirubin: 0.2 mg/dL — ABNORMAL LOW (ref 0.3–1.2)
Total Protein: 6.8 g/dL (ref 6.5–8.1)

## 2021-05-12 MED ORDER — SODIUM CHLORIDE 0.9% FLUSH
10.0000 mL | Freq: Once | INTRAVENOUS | Status: AC
  Administered 2021-05-12: 10 mL

## 2021-05-15 ENCOUNTER — Other Ambulatory Visit (HOSPITAL_COMMUNITY): Payer: Self-pay

## 2021-05-18 ENCOUNTER — Telehealth (INDEPENDENT_AMBULATORY_CARE_PROVIDER_SITE_OTHER): Payer: Self-pay | Admitting: Primary Care

## 2021-05-18 NOTE — Telephone Encounter (Signed)
Please contact patient to reschedule

## 2021-05-18 NOTE — Telephone Encounter (Signed)
Patient called In needs to reschedule appt with Social worker Dennison Mascot PLease call back

## 2021-05-19 ENCOUNTER — Other Ambulatory Visit: Payer: Self-pay

## 2021-05-19 ENCOUNTER — Inpatient Hospital Stay: Payer: Medicaid Other

## 2021-05-19 ENCOUNTER — Encounter: Payer: Self-pay | Admitting: Adult Health

## 2021-05-19 ENCOUNTER — Inpatient Hospital Stay (HOSPITAL_BASED_OUTPATIENT_CLINIC_OR_DEPARTMENT_OTHER): Payer: Medicaid Other | Admitting: Adult Health

## 2021-05-19 VITALS — BP 149/90

## 2021-05-19 VITALS — BP 159/100 | HR 76 | Temp 97.7°F | Resp 18 | Ht 71.0 in | Wt 222.8 lb

## 2021-05-19 DIAGNOSIS — Z170 Estrogen receptor positive status [ER+]: Secondary | ICD-10-CM

## 2021-05-19 DIAGNOSIS — Z5111 Encounter for antineoplastic chemotherapy: Secondary | ICD-10-CM | POA: Diagnosis not present

## 2021-05-19 DIAGNOSIS — Z95828 Presence of other vascular implants and grafts: Secondary | ICD-10-CM

## 2021-05-19 DIAGNOSIS — C50411 Malignant neoplasm of upper-outer quadrant of right female breast: Secondary | ICD-10-CM

## 2021-05-19 DIAGNOSIS — C50412 Malignant neoplasm of upper-outer quadrant of left female breast: Secondary | ICD-10-CM

## 2021-05-19 LAB — CBC WITH DIFFERENTIAL/PLATELET
Abs Immature Granulocytes: 0.03 10*3/uL (ref 0.00–0.07)
Basophils Absolute: 0 10*3/uL (ref 0.0–0.1)
Basophils Relative: 1 %
Eosinophils Absolute: 0.1 10*3/uL (ref 0.0–0.5)
Eosinophils Relative: 1 %
HCT: 31.2 % — ABNORMAL LOW (ref 36.0–46.0)
Hemoglobin: 9.7 g/dL — ABNORMAL LOW (ref 12.0–15.0)
Immature Granulocytes: 1 %
Lymphocytes Relative: 19 %
Lymphs Abs: 1.1 10*3/uL (ref 0.7–4.0)
MCH: 25.2 pg — ABNORMAL LOW (ref 26.0–34.0)
MCHC: 31.1 g/dL (ref 30.0–36.0)
MCV: 81 fL (ref 80.0–100.0)
Monocytes Absolute: 0.5 10*3/uL (ref 0.1–1.0)
Monocytes Relative: 8 %
Neutro Abs: 4.1 10*3/uL (ref 1.7–7.7)
Neutrophils Relative %: 70 %
Platelets: 311 10*3/uL (ref 150–400)
RBC: 3.85 MIL/uL — ABNORMAL LOW (ref 3.87–5.11)
RDW: 18.1 % — ABNORMAL HIGH (ref 11.5–15.5)
WBC: 5.9 10*3/uL (ref 4.0–10.5)
nRBC: 0 % (ref 0.0–0.2)

## 2021-05-19 LAB — CMP (CANCER CENTER ONLY)
ALT: 21 U/L (ref 0–44)
AST: 22 U/L (ref 15–41)
Albumin: 3.9 g/dL (ref 3.5–5.0)
Alkaline Phosphatase: 89 U/L (ref 38–126)
Anion gap: 6 (ref 5–15)
BUN: 13 mg/dL (ref 6–20)
CO2: 27 mmol/L (ref 22–32)
Calcium: 9 mg/dL (ref 8.9–10.3)
Chloride: 105 mmol/L (ref 98–111)
Creatinine: 0.76 mg/dL (ref 0.44–1.00)
GFR, Estimated: >60 mL/min (ref >60)
Glucose, Bld: 122 mg/dL — ABNORMAL HIGH (ref 70–99)
Potassium: 3.8 mmol/L (ref 3.5–5.1)
Sodium: 138 mmol/L (ref 135–145)
Total Bilirubin: 0.5 mg/dL (ref 0.3–1.2)
Total Protein: 7.1 g/dL (ref 6.5–8.1)

## 2021-05-19 MED ORDER — DIPHENHYDRAMINE HCL 50 MG/ML IJ SOLN
25.0000 mg | Freq: Once | INTRAMUSCULAR | Status: AC
  Administered 2021-05-19: 25 mg via INTRAVENOUS

## 2021-05-19 MED ORDER — SODIUM CHLORIDE 0.9% FLUSH
10.0000 mL | Freq: Once | INTRAVENOUS | Status: AC
  Administered 2021-05-19: 10 mL

## 2021-05-19 MED ORDER — HEPARIN SOD (PORK) LOCK FLUSH 100 UNIT/ML IV SOLN
500.0000 [IU] | Freq: Once | INTRAVENOUS | Status: AC | PRN
  Administered 2021-05-19: 500 [IU]

## 2021-05-19 MED ORDER — SODIUM CHLORIDE 0.9 % IV SOLN
80.0000 mg/m2 | Freq: Once | INTRAVENOUS | Status: AC
  Administered 2021-05-19: 180 mg via INTRAVENOUS
  Filled 2021-05-19: qty 30

## 2021-05-19 MED ORDER — SODIUM CHLORIDE 0.9 % IV SOLN
Freq: Once | INTRAVENOUS | Status: AC
Start: 2021-05-19 — End: 2021-05-19

## 2021-05-19 MED ORDER — DEXAMETHASONE SODIUM PHOSPHATE 10 MG/ML IJ SOLN
4.0000 mg | Freq: Once | INTRAMUSCULAR | Status: AC
  Administered 2021-05-19: 4 mg via INTRAVENOUS

## 2021-05-19 MED ORDER — COLD PACK MISC ONCOLOGY: 1.0000 | Freq: Once | Status: DC | PRN

## 2021-05-19 MED ORDER — SODIUM CHLORIDE 0.9% FLUSH
10.0000 mL | INTRAVENOUS | Status: DC | PRN
  Administered 2021-05-19: 10 mL

## 2021-05-19 MED ORDER — FAMOTIDINE 20 MG IN NS 100 ML IVPB
20.0000 mg | Freq: Once | INTRAVENOUS | Status: AC
  Administered 2021-05-19: 20 mg via INTRAVENOUS

## 2021-05-19 NOTE — Progress Notes (Signed)
RN clarified with Chelsea Bihari, NP about pt's treatment today. Per MD note treatment was held on 05/12/2021 due to worsening peripheral neuropathy. Per MD note if neuropathy resolved by today, treatment could be resumed. Per [name], NP OK to proceed with treatment today. Pt states she has no neuropathy in her hands or feet.

## 2021-05-19 NOTE — Patient Instructions (Signed)
[facility]  Discharge Instructions: Thank you for choosing [facility] to provide your oncology and hematology care.   If you have a lab appointment with the Cancer Center, please go directly to the Cancer Center and check in at the registration area.   Wear comfortable clothing and clothing appropriate for easy access to any Portacath or PICC line.   We strive to give you quality time with your provider. You may need to reschedule your appointment if you arrive late (15 or more minutes).  Arriving late affects you and other patients whose appointments are after yours.  Also, if you miss three or more appointments without notifying the office, you may be dismissed from the clinic at the provider's discretion.      For prescription refill requests, have your pharmacy contact our office and allow 72 hours for refills to be completed.    Today you received the following chemotherapy and/or immunotherapy agent: Paclitaxel (Taxol)   To help prevent nausea and vomiting after your treatment, we encourage you to take your nausea medication as directed.  BELOW ARE SYMPTOMS THAT SHOULD BE REPORTED IMMEDIATELY: *FEVER GREATER THAN 100.4 F (38 C) OR HIGHER *CHILLS OR SWEATING *NAUSEA AND VOMITING THAT IS NOT CONTROLLED WITH YOUR NAUSEA MEDICATION *UNUSUAL SHORTNESS OF BREATH *UNUSUAL BRUISING OR BLEEDING *URINARY PROBLEMS (pain or burning when urinating, or frequent urination) *BOWEL PROBLEMS (unusual diarrhea, constipation, pain near the anus) TENDERNESS IN MOUTH AND THROAT WITH OR WITHOUT PRESENCE OF ULCERS (sore throat, sores in mouth, or a toothache) UNUSUAL RASH, SWELLING OR PAIN  UNUSUAL VAGINAL DISCHARGE OR ITCHING   Items with * indicate a potential emergency and should be followed up as soon as possible or go to the Emergency Department if any problems should occur.  Please show the CHEMOTHERAPY ALERT CARD or IMMUNOTHERAPY ALERT CARD at  check-in to the Emergency Department and triage nurse.  Should you have questions after your visit or need to cancel or reschedule your appointment, please contact [facility]  Dept: [phone]  and follow the prompts.  Office hours are 8:00 a.m. to 4:30 p.m. Monday - Friday. Please note that voicemails left after 4:00 p.m. may not be returned until the following business day.  We are closed weekends and major holidays. You have access to a nurse at all times for urgent questions. Please call the main number to the clinic Dept: [phone] and follow the prompts.   For any non-urgent questions, you may also contact your provider using MyChart. We now offer e-Visits for anyone 18 and older to request care online for non-urgent symptoms. For details visit [email or web address].   Also download the MyChart app! Go to the app store, search "MyChart", open the app, select [facility], and log in with your MyChart username and password.  Due to Covid, a mask is required upon entering the hospital/clinic. If you do not have a mask, one will be given to you upon arrival. For doctor visits, patients may have 1 support person aged 18 or older with them. For treatment visits, patients cannot have anyone with them due to current Covid guidelines and our immunocompromised population.  

## 2021-05-19 NOTE — Progress Notes (Signed)
[place]  Telephone:([phone]) [phone] Fax:(336) [phone]    ID: TENELLE ANDREASON DOB: Oct 18, 1970  MR#: [id number]  DHR#:[id number]  Patient Care Team: [name], NP as PCP - General (Internal Medicine) [name], DO as PCP - Cardiology (Cardiology) [name], RN as Oncology Nurse Navigator [name], RN as Oncology Nurse Navigator [name], MD as Consulting Physician (Radiation Oncology) Magrinat, Virgie Dad, MD as Consulting Physician (Oncology) [name], MD as Consulting Physician (General Surgery) Scot Dock, NP OTHER MD:  CHIEF COMPLAINT: Bilateral estrogen receptor positive breast cancer  CURRENT TREATMENT:  neoadjuvant chemotherapy   INTERVAL HISTORY: Ethleen returns today for follow up and treatment of her bilateral breast cancer.   She continues on weekly paclitaxel. Today is week 8 of 12 planned doses.  At her last appointment 1 week ago she met with Dr. 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  She also was reporting pain in her feet.  She tells me that this has completely resolved .  Liyana is experiencing increased fatigue as expected during this time of treatment.  She also has seen different providers and that she is planning on going to surgery she is having difficulty with the tiredness and her inability to work.  She has stopped working at this point and plans to restart following her surgery.  As we were talking about this Ayane became tearful.  She said that she felt like she was expected to work through her treatment and it has just been too difficult.  She is scheduled for posttreatment breast MRI on 06/02/2021.  She is scheduled for follow-up with Dr. Ninfa Linden 06/05/2021.  Recall that Donnette has booked a trip to [place] from 06/10/2021 through 06/26/2021.  She wants to make sure she has her surgery appointments and imaging scheduled beforehand.      REVIEW OF SYSTEMS: Review of Systems  Constitutional:  Positive for fatigue. Negative for appetite change, chills, fever and unexpected weight change.  HENT:   Negative for hearing loss, lump/mass and trouble swallowing.   Eyes:  Negative for eye problems and icterus.  Respiratory:  Negative for chest tightness, cough and shortness of breath.   Cardiovascular:  Negative for chest pain, leg swelling and palpitations.  Gastrointestinal:  Negative for abdominal distention, abdominal pain, constipation, diarrhea, nausea and vomiting.  Endocrine: Negative for hot flashes.  Genitourinary:  Negative for difficulty urinating.   Musculoskeletal:  Positive for arthralgias.  Skin:  Negative for itching and rash.  Neurological:  Negative for dizziness, extremity weakness, headaches and numbness.  Hematological:  Negative for adenopathy. Does not bruise/bleed easily.  Psychiatric/Behavioral:  Negative for depression. The patient is not nervous/anxious.      COVID 19 VACCINATION STATUS: [place] x2, most recently 10/2020, no booster as of June 2022   HISTORY OF CURRENT ILLNESS: From the original intake note:  MAYBREE RILING has a history of excisional biopsy of a fibroadenoma from the lower outer right breast in 2011.  More recently she noted a knot in her right breast.  She brought it to medical attention and proceeded to bilateral diagnostic mammography with tomography and bilateral breast ultrasonography at [facility] on 11/18/2020 showing: breast density category B;   RIGHT: 3.2 cm mass associated with architectural distortion and scattered calcifications in upper-outer right breast at 10 o'clock, corresponding to palpable concern; indeterminate 1.1 cm mass in upper-outer right breast at 9:30;  solitary pathologic right axillary lymph node with eccentric cortical thickening up to 7 mm. LEFT: 0.8 cm mass involving upper-outer left breast at 2 o'clock; no pathologic left axillary  lymphadenopathy.  Accordingly on 12/03/2020 she proceeded to biopsy of the bilateral breast areas in question. The pathology from this procedure ([id number]) showed:  Right Breast, 9:30 fibroadenoma Right Breast, 10 o'clock Invasive and in situ mammary carcinoma, e-cadherin negative, grade 2 Prognostic indicators significant for: estrogen receptor, 95% positive and progesterone receptor, 100% positive, both with strong staining intensity. Proliferation marker Ki67 at 40%. HER2 negative by immunohistochemistry (0). 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 Grade or-2  Prognostic indicators significant for: estrogen receptor, 95% positive and progesterone receptor, 100% positive, both with strong staining intensity. Proliferation marker Ki67 at 15%. HER2 negative by immunohistochemistry (1+).   Cancer Staging  Malignant neoplasm of upper-outer quadrant of left breast in female, estrogen receptor positive ([place]) Staging form: Breast, AJCC 8th Edition - Clinical stage from 12/17/2020: cT1b, cN0, cM0, ER+, PR+, HER2- - Signed by [name], MD on 12/17/2020 Stage prefix: Initial diagnosis Laterality: Left Staged by: Pathologist and managing physician Stage used in treatment planning: Yes National guidelines used in treatment planning: Yes Type of national guideline used in treatment planning: NCCN  Malignant neoplasm of upper-outer quadrant of right breast in female, estrogen receptor positive ([place]) Staging form: Breast, AJCC 8th Edition - Clinical stage from 12/17/2020: cT2, cN1, cM0, ER+, PR+, HER2- - Signed by [name], MD on 12/17/2020 Stage prefix: Initial diagnosis Laterality: Right Staged by: Pathologist and managing physician Stage used in treatment planning: Yes National guidelines used in treatment  planning: Yes Type of national guideline used in treatment planning: NCCN  The patient's subsequent history is as detailed below.   PAST MEDICAL HISTORY: Past Medical History:  Diagnosis Date   Allergy    seasonal allergies   Anxiety    on meds   Asthma    uses inhaler   Breast cancer ([place]) 2012   RIGHT lumpectomy   Cancer ([place]) 2022   RIGHT breast lump-dx 2022   Depression    on meds   DVT (deep venous thrombosis) ([place]) 2010   after hysterectomy   Family history of uterine cancer    GERD (gastroesophageal reflux disease)    with certain foods/OTC PRN meds   Headache(784.0)    Hyperlipidemia    on meds   Hypertension    on meds    PAST SURGICAL HISTORY: Past Surgical History:  Procedure Laterality Date   ABDOMINAL HYSTERECTOMY     BREAST EXCISIONAL BIOPSY Right 09/16/2009   BREAST SURGERY     lumpectomy   PORTACATH PLACEMENT Left 01/26/2021   Procedure: INSERTION PORT-A-CATH;  Surgeon: [name], MD;  Location: [facility];  Service: General;  Laterality: Left;   TUBAL LIGATION    Patient's ovaries are still in place, status post hysterectomy 08/18/2010 with benign pathology   FAMILY HISTORY: Family History  Problem Relation Age of Onset   Cancer Mother        ?, dx <50   Hypertension Father    Cancer Maternal Aunt        uterine vs ovarian, dx <50   Cancer Maternal Aunt        unknown type, dx <50   Uterine cancer Cousin 50   Colon polyps  Neg Hx    Colon cancer Neg Hx    Esophageal cancer Neg Hx    Stomach cancer Neg Hx    Her father is age 38 and her mother age 40 as of 11/2020. Her mother has a history of cancer (type unsure).  The patient's mother had two sisters also with cancer, one with uterine and the other cancer type is unknown to the patient. Yoshino has 1 brother and 1 sister.    GYNECOLOGIC HISTORY:  No LMP recorded. Patient has had a hysterectomy. Menarche: 50 years old Age at first live birth: 50 years old [place] P 5 LMP 2012, with  hysterectomy Contraceptive never used HRT never used  Hysterectomy? Yes, 07/2010 for fibroids (pathology in Epic) BSO? 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 She is single and lives at home with her three sons. Her two daughters also live here in [place]. Daughter Ginni Eichler, age 30, works at [employer]. Daughter Jilda Panda Sample, age 17, and son Isiah Nils Pyle, age 51, work at [employer]. Son Sharlee Blew, age 89, and daughter Alvera Novel, age 59, are currently not employed. 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    ADVANCED DIRECTIVES: not in place   HEALTH MAINTENANCE: Social History   Tobacco Use   Smoking status: Never   Smokeless tobacco: Never  Vaping Use   Vaping Use: Never used  Substance Use Topics   Alcohol use: Not Currently    Alcohol/week: 7.0 standard drinks    Types: 7 Standard drinks or equivalent per week    Comment: occassional   Drug use: No     Colonoscopy: 10/2020 (Dr. Fuller Plan), recall 2027  PAP: 05/2010, negative, repeat not indicated (s/p hysterectomy)  Bone density: n/a (age)   No Known Allergies  Current Outpatient Medications  Medication Sig Dispense Refill   acetaminophen (TYLENOL) 500 MG tablet Take 1 tablet by mouth 3 times daily as needed. Take with Aleve 220 mg 90 tablet 0   albuterol (VENTOLIN HFA) 108 (90 Base) MCG/ACT inhaler Inhale 2 puffs into the lungs every 6 (six) hours as needed for wheezing or shortness of breath. 18 g 2   amLODipine (NORVASC) 10 MG tablet Take 1 tablet (10 mg total) by mouth daily. 90 tablet 1   Cholecalciferol (VITAMIN D3) 2000 units capsule Take 2,000 Units by mouth daily.  11   dexamethasone (DECADRON) 4 MG tablet Take 4 mg by mouth as needed. Given with chemo treatment.     diazepam (VALIUM) 5 MG tablet Take 5 mg by mouth as needed.     escitalopram (LEXAPRO) 20 MG tablet Take 1 tablet (20 mg total) by mouth daily. 90 tablet 1   fluticasone (FLONASE) 50 MCG/ACT  nasal spray Use 2 sprays in each nostril daily. 16 g 11   gabapentin (NEURONTIN) 300 MG capsule Take 1 capsule by mouth daily and 2 capsules at bedtime 90 capsule 2   hydrochlorothiazide (HYDRODIURIL) 25 MG tablet Take 1 tablet (25 mg total) by mouth daily. 90 tablet 1   HYDROcodone-acetaminophen (NORCO/VICODIN) 5-325 MG tablet Take 1 tablet by mouth 3 (three) times daily as needed for severe pain. Must last 30 days. 90 tablet 0   lidocaine-prilocaine (EMLA) cream Apply 1 application topically as needed.     montelukast (SINGULAIR) 10 MG tablet Take 1 tablet (10 mg total) by mouth at bedtime. 90 tablet 3   ondansetron (ZOFRAN) 8 MG tablet Take 8 mg by mouth as needed.  prochlorperazine (COMPAZINE) 10 MG tablet Take 10 mg by mouth as needed.     promethazine (PHENERGAN) 25 MG suppository Place 25 mg rectally as needed.     propranolol (INDERAL) 10 MG tablet Take 1 tablet (10 mg total) by mouth 2 (two) times daily. 180 tablet 3   No current facility-administered medications for this visit.   Facility-Administered Medications Ordered in Other Visits  Medication Dose Route Frequency Provider Last Rate Last Admin   acetaminophen (TYLENOL) 500 MG tablet            ondansetron (ZOFRAN) 4 MG/2ML injection            ondansetron (ZOFRAN) 4 MG/2ML injection            sodium chloride flush (NS) 0.9 % injection 10 mL  10 mL Intracatheter PRN Magrinat, Virgie Dad, MD   10 mL at 02/10/21 1716    OBJECTIVE: African-American woman in a wheelchair today  Vitals:   05/19/21 1027  BP: (!) 159/100  Pulse: 76  Resp: 18  Temp: 97.7 F (36.5 C)  SpO2: 100%      Body mass index is 31.07 kg/m.   Wt Readings from Last 3 Encounters:  05/19/21 222 lb 12.8 oz (101.1 kg)  05/12/21 220 lb 11.2 oz (100.1 kg)  05/06/21 220 lb (99.8 kg)     ECOG FS:2 GENERAL: Patient is a well appearing female in no acute distress HEENT:  Sclerae anicteric.  Oropharynx clear and moist. No ulcerations or evidence of  oropharyngeal candidiasis. Neck is supple.  NODES:  No cervical, supraclavicular, or axillary lymphadenopathy palpated.  BREAST EXAM:  Deferred. LUNGS:  Clear to auscultation bilaterally.  No wheezes or rhonchi. HEART:  Regular rate and rhythm. No murmur appreciated. ABDOMEN:  Soft, nontender.  Positive, normoactive bowel sounds. No organomegaly palpated. MSK:  No focal spinal tenderness to palpation. Full range of motion bilaterally in the upper extremities. EXTREMITIES:  No peripheral edema.   SKIN:  Clear with no obvious rashes or skin changes. No nail dyscrasia. NEURO:  Nonfocal. Well oriented.  Appropriate affect.   LAB RESULTS:  CMP     Component Value Date/Time   NA 141 05/12/2021 0803   NA 139 10/06/2020 1432   K 4.0 05/12/2021 0803   CL 106 05/12/2021 0803   CO2 24 05/12/2021 0803   GLUCOSE 125 (H) 05/12/2021 0803   BUN 15 05/12/2021 0803   BUN 11 10/06/2020 1432   CREATININE 0.79 05/12/2021 0803   CREATININE 0.70 03/02/2021 1533   CALCIUM 9.4 05/12/2021 0803   PROT 6.8 05/12/2021 0803   PROT 6.9 10/06/2020 1432   ALBUMIN 3.8 05/12/2021 0803   ALBUMIN 4.1 10/06/2020 1432   AST 18 05/12/2021 0803   AST 14 (L) 03/02/2021 1533   ALT 20 05/12/2021 0803   ALT 14 03/02/2021 1533   ALKPHOS 98 05/12/2021 0803   BILITOT 0.2 (L) 05/12/2021 0803   BILITOT 0.3 03/02/2021 1533   GFRNONAA >60 05/12/2021 0803   GFRNONAA >60 03/02/2021 1533   GFRAA >60 09/07/2019 1340    Lab Results  Component Value Date   WBC 5.9 05/19/2021   NEUTROABS 4.1 05/19/2021   HGB 9.7 (L) 05/19/2021   HCT 31.2 (L) 05/19/2021   MCV 81.0 05/19/2021   PLT 311 05/19/2021     Lab Results  Component Value Date   CA2729 66.9 (H) 02/10/2021    STUDIES: No results found.   ELIGIBLE FOR AVAILABLE RESEARCH PROTOCOL: no  ASSESSMENT: 50 y.o.  [place] woman status post bilateral breast biopsies 12/03/2020, showing  (1) on the right, a clinical T2 N1, stage IIa invasive lobular carcinoma,  grade 2, estrogen and progesterone receptor strongly positive, HER2 not amplified, with an MIB-1 of 40%   (a) the biopsied right axillary lymph node was positive with extracapsular extension   (b) a second right breast mass also biopsied was a fibroadenoma, concordant   (c) biopsy of an area of non-mass-like enhancement in the upper right breast pending   (2) on the left, a clinical T1b N0, stage IA invasive ductal carcinoma, grade 1 or 2, estrogen and progesterone receptor positive, HER2 not amplified, with an MIB 1 of 15%   (a) biopsy of a 0.4 cm enhancement in the medial left breast pending  (3) genetics testing through the [facility] BRCAplus panel (report date 12/24/2020) or the CancerNext-Expanded + RNAinsight panel (report date 12/31/2020) found no deleterious mutations in ATM, BRCA1, BRCA2, CDH1, CHEK2, PALB2, PTEN, and TP53. The CancerNext-Expanded + RNAinsight gene panel offered by [employer] and includes sequencing and rearrangement analysis for the following 77 genes: AIP, ALK, APC, ATM, AXIN2, BAP1, BARD1, BLM, BMPR1A, BRCA1, BRCA2, BRIP1, CDC73, CDH1, CDK4, CDKN1B, CDKN2A, CHEK2, CTNNA1, DICER1, FANCC, FH, FLCN, GALNT12, KIF1B, LZTR1, MAX, MEN1, MET, MLH1, MSH2, MSH3, MSH6, MUTYH, NBN, NF1, NF2, NTHL1, PALB2, PHOX2B, PMS2, POT1, PRKAR1A, PTCH1, PTEN, RAD51C, RAD51D, RB1, RECQL, RET, SDHA, SDHAF2, SDHB, SDHC, SDHD, SMAD4, SMARCA4, SMARCB1, SMARCE1, STK11, SUFU, TMEM127, TP53, TSC1, TSC2, VHL and XRCC2 (sequencing and deletion/duplication); EGFR, EGLN1, HOXB13, KIT, MITF, PDGFRA, POLD1 and POLE (sequencing only); EPCAM and GREM1 (deletion/duplication only). RNA data is routinely analyzed for use in variant interpretation for all genes.  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  (5)  definitive surgery to follow chemotherapy  (6) chemotherapy consisting of doxorubicin and cyclophosphamide in dose dense fashion x4 started 01/27/2021, completed on 03/10/2021,followed by weekly paclitaxel with 12 doses planned  (7) adjuvant radiation on the right, possibly also on the left  (8) antiestrogens to follow at the completion of local treatment  (9) oral mass noted at time of intubation for port placement:  (A) CT head and neck 02/02/2021 negative  (B) ENT evaluation Dr. Kandis Cocking 02/06/2021 benign note from her February 06 2021 visit notes mild mucositis, thrush, and reflux.  When we see Rhilyn again we will start her on a PPI and Diflucan for thrush   PLAN: Wyoma is here today to receive her eighth cycle of weekly paclitaxel.  Since her neuropathy has resolved completely, she can proceed with her therapy today.  I reviewed with her that the largest benefit is from the first chemotherapy regimen, and that she got a majority of the benefit from this current regimen.  I let her know that if the neuropathy returns that we can stop chemotherapy.  Due to this we will see her next week to touch base on how her neuropathy is doing.  She has requested to switch her appointment on December 13 from Dr. Jana Hakim to myself.  I have placed this request and will let him know.  On this day she will have her breast MRI.  I gave her reassurance that a lot of women do not make it through the paclitaxel.  I also reassured her that many women also do not work during  chemotherapy, and that it is okay for her to be out of work because she is tired and is going to a lot of appointments.  She notes that her breast cancer is harder to feel and I am looking forward to seeing what her results are with her MRI scheduled on December 13.   Total encounter time 30 minutes in chart review, lab review, order entry, face-to-face visit time, care coordination, and documentation of the encounter.   Wilber Bihari,  NP 05/19/21 10:53 AM Medical Oncology and Hematology [facility] [place], [place] [address] Tel. [phone]    Fax. [phone]  *Total Encounter Time as defined by the Centers for Medicare and Medicaid Services includes, in addition to the face-to-face time of a patient visit (documented in the note above) non-face-to-face time: obtaining and reviewing outside history, ordering and reviewing medications, tests or procedures, care coordination (communications with other health care professionals or caregivers) and documentation in the medical record.

## 2021-05-21 ENCOUNTER — Ambulatory Visit (INDEPENDENT_AMBULATORY_CARE_PROVIDER_SITE_OTHER): Payer: Medicaid Other | Admitting: Clinical

## 2021-05-21 ENCOUNTER — Other Ambulatory Visit: Payer: Self-pay

## 2021-05-21 ENCOUNTER — Ambulatory Visit (HOSPITAL_BASED_OUTPATIENT_CLINIC_OR_DEPARTMENT_OTHER): Payer: Medicaid Other | Attending: Cardiology | Admitting: Cardiovascular Disease

## 2021-05-22 ENCOUNTER — Telehealth: Payer: Self-pay | Admitting: Cardiology

## 2021-05-22 NOTE — Telephone Encounter (Addendum)
Patient called stating she has failed home sleep study two times.  She needs an order for  sleep study.

## 2021-05-22 NOTE — Telephone Encounter (Signed)
Called patient, advised that she had failed the home sleep study twice. She did it again last night and it only picked up 2 30 minute sessions, when she wore it all night.  Patient states she would like an order for in lab study- advised I would route to MD to review and give okay to order.  Thanks!

## 2021-05-25 NOTE — Telephone Encounter (Signed)
Previously lab sleep study was denied for this pt.

## 2021-05-26 ENCOUNTER — Ambulatory Visit: Payer: Medicaid Other | Admitting: Adult Health

## 2021-05-26 ENCOUNTER — Inpatient Hospital Stay: Payer: Medicaid Other

## 2021-05-26 ENCOUNTER — Encounter: Payer: Self-pay | Admitting: Adult Health

## 2021-05-26 ENCOUNTER — Encounter: Payer: Self-pay | Admitting: *Deleted

## 2021-05-26 ENCOUNTER — Inpatient Hospital Stay: Payer: Medicaid Other | Attending: Oncology

## 2021-05-26 ENCOUNTER — Other Ambulatory Visit: Payer: Self-pay

## 2021-05-26 ENCOUNTER — Inpatient Hospital Stay (HOSPITAL_BASED_OUTPATIENT_CLINIC_OR_DEPARTMENT_OTHER): Payer: Medicaid Other | Admitting: Adult Health

## 2021-05-26 ENCOUNTER — Other Ambulatory Visit: Payer: Medicaid Other

## 2021-05-26 VITALS — BP 150/98 | HR 68 | Temp 98.6°F | Resp 20 | Wt 222.5 lb

## 2021-05-26 DIAGNOSIS — G629 Polyneuropathy, unspecified: Secondary | ICD-10-CM | POA: Diagnosis not present

## 2021-05-26 DIAGNOSIS — C50411 Malignant neoplasm of upper-outer quadrant of right female breast: Secondary | ICD-10-CM

## 2021-05-26 DIAGNOSIS — C50412 Malignant neoplasm of upper-outer quadrant of left female breast: Secondary | ICD-10-CM | POA: Diagnosis present

## 2021-05-26 DIAGNOSIS — Z170 Estrogen receptor positive status [ER+]: Secondary | ICD-10-CM

## 2021-05-26 DIAGNOSIS — Z95828 Presence of other vascular implants and grafts: Secondary | ICD-10-CM

## 2021-05-26 DIAGNOSIS — Z5111 Encounter for antineoplastic chemotherapy: Secondary | ICD-10-CM | POA: Insufficient documentation

## 2021-05-26 LAB — CBC WITH DIFFERENTIAL/PLATELET
Abs Immature Granulocytes: 0.03 10*3/uL (ref 0.00–0.07)
Basophils Absolute: 0.1 10*3/uL (ref 0.0–0.1)
Basophils Relative: 1 %
Eosinophils Absolute: 0.2 10*3/uL (ref 0.0–0.5)
Eosinophils Relative: 3 %
HCT: 28.8 % — ABNORMAL LOW (ref 36.0–46.0)
Hemoglobin: 8.9 g/dL — ABNORMAL LOW (ref 12.0–15.0)
Immature Granulocytes: 1 %
Lymphocytes Relative: 30 %
Lymphs Abs: 1.5 10*3/uL (ref 0.7–4.0)
MCH: 24.9 pg — ABNORMAL LOW (ref 26.0–34.0)
MCHC: 30.9 g/dL (ref 30.0–36.0)
MCV: 80.7 fL (ref 80.0–100.0)
Monocytes Absolute: 0.3 10*3/uL (ref 0.1–1.0)
Monocytes Relative: 7 %
Neutro Abs: 3.1 10*3/uL (ref 1.7–7.7)
Neutrophils Relative %: 58 %
Platelets: 332 10*3/uL (ref 150–400)
RBC: 3.57 MIL/uL — ABNORMAL LOW (ref 3.87–5.11)
RDW: 16.2 % — ABNORMAL HIGH (ref 11.5–15.5)
WBC: 5.2 10*3/uL (ref 4.0–10.5)
nRBC: 0 % (ref 0.0–0.2)

## 2021-05-26 LAB — COMPREHENSIVE METABOLIC PANEL
ALT: 22 U/L (ref 0–44)
AST: 16 U/L (ref 15–41)
Albumin: 3.6 g/dL (ref 3.5–5.0)
Alkaline Phosphatase: 91 U/L (ref 38–126)
Anion gap: 11 (ref 5–15)
BUN: 12 mg/dL (ref 6–20)
CO2: 23 mmol/L (ref 22–32)
Calcium: 8.9 mg/dL (ref 8.9–10.3)
Chloride: 105 mmol/L (ref 98–111)
Creatinine, Ser: 0.82 mg/dL (ref 0.44–1.00)
GFR, Estimated: >60 mL/min (ref >60)
Glucose, Bld: 128 mg/dL — ABNORMAL HIGH (ref 70–99)
Potassium: 3.6 mmol/L (ref 3.5–5.1)
Sodium: 139 mmol/L (ref 135–145)
Total Bilirubin: 0.3 mg/dL (ref 0.3–1.2)
Total Protein: 6.6 g/dL (ref 6.5–8.1)

## 2021-05-26 MED ORDER — SODIUM CHLORIDE 0.9 % IV SOLN
80.0000 mg/m2 | Freq: Once | INTRAVENOUS | Status: AC
  Administered 2021-05-26: 180 mg via INTRAVENOUS
  Filled 2021-05-26: qty 30

## 2021-05-26 MED ORDER — HEPARIN SOD (PORK) LOCK FLUSH 100 UNIT/ML IV SOLN
500.0000 [IU] | Freq: Once | INTRAVENOUS | Status: AC | PRN
  Administered 2021-05-26: 500 [IU]

## 2021-05-26 MED ORDER — SODIUM CHLORIDE 0.9% FLUSH
10.0000 mL | Freq: Once | INTRAVENOUS | Status: AC
  Administered 2021-05-26: 10 mL

## 2021-05-26 MED ORDER — SODIUM CHLORIDE 0.9% FLUSH
10.0000 mL | INTRAVENOUS | Status: DC | PRN
  Administered 2021-05-26: 10 mL

## 2021-05-26 MED ORDER — SODIUM CHLORIDE 0.9 % IV SOLN
Freq: Once | INTRAVENOUS | Status: AC
Start: 2021-05-26 — End: 2021-05-26

## 2021-05-26 MED ORDER — DEXAMETHASONE SODIUM PHOSPHATE 10 MG/ML IJ SOLN
4.0000 mg | Freq: Once | INTRAMUSCULAR | Status: AC
  Administered 2021-05-26: 4 mg via INTRAVENOUS
  Filled 2021-05-26: qty 1

## 2021-05-26 MED ORDER — DIPHENHYDRAMINE HCL 50 MG/ML IJ SOLN
25.0000 mg | Freq: Once | INTRAMUSCULAR | Status: AC
  Administered 2021-05-26: 25 mg via INTRAVENOUS
  Filled 2021-05-26: qty 1

## 2021-05-26 MED ORDER — FAMOTIDINE 20 MG IN NS 100 ML IVPB
20.0000 mg | Freq: Once | INTRAVENOUS | Status: AC
  Administered 2021-05-26: 20 mg via INTRAVENOUS
  Filled 2021-05-26: qty 100

## 2021-05-26 NOTE — Assessment & Plan Note (Signed)
Deferred is a 50 year old woman who is here today to receive her #9/12 weekly paclitaxel neoadjuvantly for her estrogen receptor progesterone receptor positive breast cancer.  #1.  Bilateral breast cancer: She will proceed with chemotherapy today.  She continues to completely deny experiencing any peripheral neuropathy.  We will continue to treat her with paclitaxel.  2.  Peripheral neuropathy: At her last visit we reviewed the risk of peripheral neuropathy and that it can lead to disability and the inability to walk her right with a pencil.  She reassures me that she is not experiencing any numbness or tingling in her fingertips or toes.  She reassures me that she is not having any new pain or electric type pain in her feet.  She verbalizes understanding of the risk of possibly developing peripheral neuropathy with the paclitaxel and is in agreement to proceed.  Overall Chelsea Hansen is tolerating the chemotherapy well.  I have reached out to our nurse navigators to look into her MRI and appointments next week that are scheduled very closely together.  They will work on getting this adjusted for [employer].  We will see Pleasant back in 1 week for labs follow-up and her next treatment.

## 2021-05-26 NOTE — Patient Instructions (Signed)
[facility] MEDICAL [facility]COLOG[facility]  Discharge Instructions: Thank you for choosing [facility] to provide your oncology and hematology care.   If you have a lab appointment with the Cancer Center, please go directly to the Cancer Center and check in at the registration area.   Wear comfortable clothing and clothing appropriate for easy access to any Portacath or PICC line.   We strive to give you quality time with your provider. You may need to reschedule your appointment if you arrive late (15 or more minutes).  Arriving late affects you and other patients whose appointments are after yours.  Also, if you miss three or more appointments without notifying the office, you may be dismissed from the clinic at the provider's discretion.      For prescription refill requests, have your pharmacy contact our office and allow 72 hours for refills to be completed.    Today you received the following chemotherapy and/or immunotherapy agent: Paclitaxel (Taxol)   To help prevent nausea and vomiting after your treatment, we encourage you to take your nausea medication as directed.  BELOW ARE SYMPTOMS THAT SHOULD BE REPORTED IMMEDIATELY: *FEVER GREATER THAN 100.4 F (38 C) OR HIGHER *CHILLS OR SWEATING *NAUSEA AND VOMITING THAT IS NOT CONTROLLED WITH YOUR NAUSEA MEDICATION *UNUSUAL SHORTNESS OF BREATH *UNUSUAL BRUISING OR BLEEDING *URINARY PROBLEMS (pain or burning when urinating, or frequent urination) *BOWEL PROBLEMS (unusual diarrhea, constipation, pain near the anus) TENDERNESS IN MOUTH AND THROAT WITH OR WITHOUT PRESENCE OF ULCERS (sore throat, sores in mouth, or a toothache) UNUSUAL RASH, SWELLING OR PAIN  UNUSUAL VAGINAL DISCHARGE OR ITCHING   Items with * indicate a potential emergency and should be followed up as soon as possible or go to the Emergency Department if any problems should occur.  Please show the CHEMOTHERAPY ALERT CARD or IMMUNOTHERAPY ALERT CARD at  check-in to the Emergency Department and triage nurse.  Should you have questions after your visit or need to cancel or reschedule your appointment, please contact [facility]ICAL [facility]COL[facility]  Dept: [phone]  and follow the prompts.  Office hours are 8:00 a.m. to 4:30 p.m. Monday - Friday. Please note that voicemails left after 4:00 p.m. may not be returned until the following business day.  We are closed weekends and major holidays. You have access to a nurse at all times for urgent questions. Please call the main number to the clinic Dept: [phone] and follow the prompts.   For any non-urgent questions, you may also contact your provider using MyChart. We now offer e-Visits for anyone 18 and older to request care online for non-urgent symptoms. For details visit [email or web address].   Also download the MyChart app! Go to the app store, search "MyChart", open the app, select [facility], and log in with your MyChart username and password.  Due to Covid, a mask is required upon entering the hospital/clinic. If you do not have a mask, one will be given to you upon arrival. For doctor visits, patients may have 1 support person aged 18 or older with them. For treatment visits, patients cannot have anyone with them due to current Covid guidelines and our immunocompromised population.  

## 2021-05-26 NOTE — Progress Notes (Signed)
[facility] Follow up:    [place], [place] [address]   DIAGNOSIS:  Cancer Staging  Malignant neoplasm of upper-outer quadrant of left breast in female, estrogen receptor positive ([place]) Staging form: Breast, AJCC 8th Edition - Clinical stage from 12/17/2020: cT1b, cN0, cM0, ER+, PR+, HER2- - Signed by [name], MD on 12/17/2020 Stage prefix: Initial diagnosis Laterality: Left Staged by: Pathologist and managing physician Stage used in treatment planning: Yes National guidelines used in treatment planning: Yes Type of national guideline used in treatment planning: NCCN  Malignant neoplasm of upper-outer quadrant of right breast in female, estrogen receptor positive ([place]) Staging form: Breast, AJCC 8th Edition - Clinical stage from 12/17/2020: cT2, cN1, cM0, ER+, PR+, HER2- - Signed by [name], MD on 12/17/2020 Stage prefix: Initial diagnosis Laterality: Right Staged by: Pathologist and managing physician Stage used in treatment planning: Yes National guidelines used in treatment planning: Yes Type of national guideline used in treatment planning: NCCN   SUMMARY OF ONCOLOGIC HISTORY: Oncology History  Malignant neoplasm of upper-outer quadrant of right breast in female, estrogen receptor positive ([place])  12/12/2020 Initial Diagnosis   status post bilateral breast biopsies 12/03/2020, showing             (1) on the right, a clinical T2 N1, stage IIa invasive lobular carcinoma, grade 2, estrogen and progesterone receptor strongly positive, HER2 not amplified, with an MIB-1 of 40%                         (a) the biopsied right axillary lymph node was positive with extracapsular extension                         (b) a second right breast mass also biopsied was a fibroadenoma, concordant  MAMMAPRINT tested on biopsy returned high risk, luminal type B, indicating significant benefit from chemo                          (c) biopsy of an area of non-mass-like enhancement in the upper right breast pending   12/17/2020 Cancer Staging   Staging form: Breast, AJCC 8th Edition - Clinical stage from 12/17/2020: cT2, cN1, cM0, ER+, PR+, HER2- - Signed by [name], MD on 12/17/2020 Stage prefix: Initial diagnosis Laterality: Right Staged by: Pathologist and managing physician Stage used in treatment planning: Yes National guidelines used in treatment planning: Yes Type of national guideline used in treatment planning: NCCN    12/24/2020 Genetic Testing   Negative genetic testing:  No pathogenic variants detected on the [facility] BRCAplus panel (report date 12/24/2020) or the CancerNext-Expanded + RNAinsight panel (report date 12/31/2020). 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   The BRCAplus panel offered by [employer] and includes sequencing and deletion/duplication analysis for the following 8 genes: ATM, BRCA1, BRCA2, CDH1, CHEK2, PALB2, PTEN, and TP53. The CancerNext-Expanded + RNAinsight gene panel offered by [employer] and includes sequencing and rearrangement analysis for the following 77 genes: AIP, ALK, APC, ATM, AXIN2, BAP1, BARD1, BLM, BMPR1A, BRCA1, BRCA2, BRIP1, CDC73, CDH1, CDK4, CDKN1B, CDKN2A, CHEK2, CTNNA1, DICER1, FANCC, FH, FLCN, GALNT12, KIF1B, LZTR1, MAX, MEN1, MET, MLH1, MSH2, MSH3, MSH6, MUTYH, NBN, NF1, NF2, NTHL1, PALB2, PHOX2B, PMS2, POT1, PRKAR1A, PTCH1, PTEN, RAD51C, RAD51D, RB1, RECQL, RET, SDHA, SDHAF2, SDHB, SDHC, SDHD,  SMAD4, SMARCA4, SMARCB1, SMARCE1, STK11, SUFU, TMEM127, TP53, TSC1, TSC2, VHL and XRCC2 (sequencing and deletion/duplication); EGFR, EGLN1, HOXB13, KIT, MITF, PDGFRA, POLD1 and POLE (sequencing only); EPCAM and GREM1 (deletion/duplication only). RNA data is routinely analyzed for use in variant interpretation for all genes.   01/27/2021 -  Neo-Adjuvant Chemotherapy   Neoadjuvant chemotherapy with Doxorubicin and  Cyclophosphamide given x 4 beginning 01/27/2021 and completing on 03/10/2021 followed by weekly paclitaxel x 12 beginning 03/24/2021   06/2021 Surgery   Surgery to follow neoadjuvant chemotherapy   07/2021 -  Radiation Therapy   Adjuvant radiation to follow surgery   09/2021 -  Anti-estrogen oral therapy   To follow radiation therapy   Malignant neoplasm of upper-outer quadrant of left breast in female, estrogen receptor positive ([place])  12/12/2020 Initial Diagnosis   on the left, a clinical T1b N0, stage IA invasive ductal carcinoma, grade 1 or 2, estrogen and progesterone receptor positive, HER2 not amplified, with an MIB 1 of 15%   12/17/2020 Cancer Staging   Staging form: Breast, AJCC 8th Edition - Clinical stage from 12/17/2020: cT1b, cN0, cM0, ER+, PR+, HER2- - Signed by [name], MD on 12/17/2020 Stage prefix: Initial diagnosis Laterality: Left Staged by: Pathologist and managing physician Stage used in treatment planning: Yes National guidelines used in treatment planning: Yes Type of national guideline used in treatment planning: NCCN    12/24/2020 Genetic Testing   Negative genetic testing:  No pathogenic variants detected on the [facility] BRCAplus panel (report date 12/24/2020) or the CancerNext-Expanded + RNAinsight panel (report date 12/31/2020). 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   The BRCAplus panel offered by [employer] and includes sequencing and deletion/duplication analysis for the following 8 genes: ATM, BRCA1, BRCA2, CDH1, CHEK2, PALB2, PTEN, and TP53. The CancerNext-Expanded + RNAinsight gene panel offered by [employer] and includes sequencing and rearrangement analysis for the following 77 genes: AIP, ALK, APC, ATM, AXIN2, BAP1, BARD1, BLM, BMPR1A, BRCA1, BRCA2, BRIP1, CDC73, CDH1, CDK4, CDKN1B, CDKN2A, CHEK2, CTNNA1, DICER1, FANCC, FH, FLCN, GALNT12, KIF1B, LZTR1, MAX, MEN1, MET, MLH1, MSH2, MSH3, MSH6,  MUTYH, NBN, NF1, NF2, NTHL1, PALB2, PHOX2B, PMS2, POT1, PRKAR1A, PTCH1, PTEN, RAD51C, RAD51D, RB1, RECQL, RET, SDHA, SDHAF2, SDHB, SDHC, SDHD, SMAD4, SMARCA4, SMARCB1, SMARCE1, STK11, SUFU, TMEM127, TP53, TSC1, TSC2, VHL and XRCC2 (sequencing and deletion/duplication); EGFR, EGLN1, HOXB13, KIT, MITF, PDGFRA, POLD1 and POLE (sequencing only); EPCAM and GREM1 (deletion/duplication only). RNA data is routinely analyzed for use in variant interpretation for all genes.   01/27/2021 -  Neo-Adjuvant Chemotherapy   Neoadjuvant chemotherapy with Doxorubicin and Cyclophosphamide given x 4 beginning 01/27/2021 and completing on 03/10/2021 followed by weekly paclitaxel x 12 beginning 03/24/2021   06/2021 Surgery   Surgery to follow neoadjuvant chemotherapy   07/2021 -  Radiation Therapy   Adjuvant radiation to follow surgery   09/2021 -  Anti-estrogen oral therapy   To follow radiation therapy     CURRENT THERAPY: weekly paclitaxel  INTERVAL HISTORY: Chelsea Hansen 50 y.o. female returns for evaluation her bilateral breast cancer prior to receiving her neoadjuvant chemotherapy.  She is due for her weekly Taxol.  This is week number 9 out of 12.  Please remember that we are following her closely for peripheral neuropathy.  There was some concerns that this could be developing a couple weeks ago.  She assures that she has absolutely 0 numbness and tingling in her fingertips or toes.  She denies any pain  in intensity in her feet.  She has mild fatigue related to her chemotherapy.  Overall she is doing quite well.   Patient Active Problem List   Diagnosis Date Noted   Pain management contract signed 05/04/2021   Chronic pain syndrome (breast cancer) 05/04/2021   Palpitations 03/25/2021   Sinus bradycardia 03/25/2021   Daytime somnolence 03/25/2021   Snoring 03/25/2021   Obesity (BMI 30-39.9) 03/25/2021   Port-A-Cath in place 01/27/2021   Genetic testing 12/24/2020   Family history of uterine cancer 12/17/2020    Malignant neoplasm of upper-outer quadrant of right breast in female, estrogen receptor positive ([place]) 12/12/2020   Malignant neoplasm of upper-outer quadrant of left breast in female, estrogen receptor positive ([place]) 12/12/2020   Hypokalemia 01/02/2012   Asthma 01/01/2012   Bradycardia 01/01/2012   Syncope 12/31/2011   Mediastinal adenopathy 12/31/2011   Anemia 12/31/2011   Chest pain 12/31/2011    has No Known Allergies.  MEDICAL HISTORY: Past Medical History:  Diagnosis Date   Allergy    seasonal allergies   Anxiety    on meds   Asthma    uses inhaler   Breast cancer ([place]) 2012   RIGHT lumpectomy   Cancer ([place]) 2022   RIGHT breast lump-dx 2022   Depression    on meds   DVT (deep venous thrombosis) ([place]) 2010   after hysterectomy   Family history of uterine cancer    GERD (gastroesophageal reflux disease)    with certain foods/OTC PRN meds   Headache(784.0)    Hyperlipidemia    on meds   Hypertension    on meds    SURGICAL HISTORY: Past Surgical History:  Procedure Laterality Date   ABDOMINAL HYSTERECTOMY     BREAST EXCISIONAL BIOPSY Right 09/16/2009   BREAST SURGERY     lumpectomy   PORTACATH PLACEMENT Left 01/26/2021   Procedure: INSERTION PORT-A-CATH;  Surgeon: [name], MD;  Location: [facility];  Service: General;  Laterality: Left;   TUBAL LIGATION      SOCIAL HISTORY: Social History   Socioeconomic History   Marital status: Single    Spouse name: Not on file   Number of children: Not on file   Years of education: Not on file   Highest education level: Not on file  Occupational History   Not on file  Tobacco Use   Smoking status: Never   Smokeless tobacco: Never  Vaping Use   Vaping Use: Never used  Substance and Sexual Activity   Alcohol use: Not Currently    Alcohol/week: 7.0 standard drinks    Types: 7 Standard drinks or equivalent per week    Comment: occassional   Drug use: No   Sexual activity: Not Currently  Other Topics  Concern   Not on file  Social History Narrative   Not on file   Social Determinants of Health   Financial Resource Strain: High Risk   Difficulty of Paying Living Expenses: Hard  Food Insecurity: Food Insecurity Present   Worried About Running Out of Food in the Last Year: Sometimes true   Ran Out of Food in the Last Year: Sometimes true  Transportation Needs: No Transportation Needs   Lack of Transportation (Medical): No   Lack of Transportation (Non-Medical): No  Physical Activity: Not on file  Stress: Not on file  Social Connections: Not on file  Intimate Partner Violence: Not on file    FAMILY HISTORY: Family History  Problem Relation Age of Onset   Cancer Mother        ?,  dx <50   Hypertension Father    Cancer Maternal Aunt        uterine vs ovarian, dx <50   Cancer Maternal Aunt        unknown type, dx <50   Uterine cancer Cousin 38   Colon polyps Neg Hx    Colon cancer Neg Hx    Esophageal cancer Neg Hx    Stomach cancer Neg Hx     Review of Systems  Constitutional:  Positive for fatigue. Negative for appetite change, chills, fever and unexpected weight change.  HENT:   Negative for hearing loss, lump/mass and trouble swallowing.   Eyes:  Negative for eye problems and icterus.  Respiratory:  Negative for chest tightness, cough and shortness of breath.   Cardiovascular:  Negative for chest pain, leg swelling and palpitations.  Gastrointestinal:  Negative for abdominal distention, abdominal pain, constipation, diarrhea, nausea and vomiting.  Endocrine: Negative for hot flashes.  Genitourinary:  Negative for difficulty urinating.   Musculoskeletal:  Negative for arthralgias.  Skin:  Negative for itching and rash.  Neurological:  Negative for dizziness, extremity weakness, headaches and numbness.  Hematological:  Negative for adenopathy. Does not bruise/bleed easily.  Psychiatric/Behavioral:  Negative for depression. The patient is not nervous/anxious.       PHYSICAL EXAMINATION  ECOG PERFORMANCE STATUS: 1 - Symptomatic but completely ambulatory The patient was examined in the treatment room.  Please see [facility] for her vital signs. There were no vitals filed for this visit.  Physical Exam Constitutional:      General: She is not in acute distress.    Appearance: Normal appearance. She is not toxic-appearing.  HENT:     Head: Normocephalic and atraumatic.  Eyes:     General: No scleral icterus. Cardiovascular:     Rate and Rhythm: Normal rate and regular rhythm.     Pulses: Normal pulses.     Heart sounds: Normal heart sounds.  Pulmonary:     Effort: Pulmonary effort is normal.     Breath sounds: Normal breath sounds.  Abdominal:     General: Abdomen is flat. Bowel sounds are normal. There is no distension.     Palpations: Abdomen is soft.     Tenderness: There is no abdominal tenderness.  Musculoskeletal:        General: No swelling.     Cervical back: Neck supple.  Lymphadenopathy:     Cervical: No cervical adenopathy.  Skin:    General: Skin is warm and dry.     Findings: No rash.  Neurological:     General: No focal deficit present.     Mental Status: She is alert.  Psychiatric:        Mood and Affect: Mood normal.        Behavior: Behavior normal.    LABORATORY DATA:  CBC    Component Value Date/Time   WBC 5.2 05/26/2021 0800   RBC 3.57 (L) 05/26/2021 0800   HGB 8.9 (L) 05/26/2021 0800   HGB 8.3 (L) 03/02/2021 1533   HGB 11.5 10/06/2020 1432   HCT 28.8 (L) 05/26/2021 0800   HCT 36.8 10/06/2020 1432   PLT 332 05/26/2021 0800   PLT 268 03/02/2021 1533   PLT 323 10/06/2020 1432   MCV 80.7 05/26/2021 0800   MCV 73 (L) 10/06/2020 1432   MCH 24.9 (L) 05/26/2021 0800   MCHC 30.9 05/26/2021 0800   RDW 16.2 (H) 05/26/2021 0800   RDW 15.9 (H) 10/06/2020 1432  LYMPHSABS 1.5 05/26/2021 0800   LYMPHSABS 1.9 10/06/2020 1432   MONOABS 0.3 05/26/2021 0800   EOSABS 0.2 05/26/2021 0800   EOSABS 0.1 10/06/2020 1432    BASOSABS 0.1 05/26/2021 0800   BASOSABS 0.0 10/06/2020 1432    CMP     Component Value Date/Time   NA 139 05/26/2021 0800   NA 139 10/06/2020 1432   K 3.6 05/26/2021 0800   CL 105 05/26/2021 0800   CO2 23 05/26/2021 0800   GLUCOSE 128 (H) 05/26/2021 0800   BUN 12 05/26/2021 0800   BUN 11 10/06/2020 1432   CREATININE 0.82 05/26/2021 0800   CREATININE 0.76 05/19/2021 1015   CALCIUM 8.9 05/26/2021 0800   PROT 6.6 05/26/2021 0800   PROT 6.9 10/06/2020 1432   ALBUMIN 3.6 05/26/2021 0800   ALBUMIN 4.1 10/06/2020 1432   AST 16 05/26/2021 0800   AST 22 05/19/2021 1015   ALT 22 05/26/2021 0800   ALT 21 05/19/2021 1015   ALKPHOS 91 05/26/2021 0800   BILITOT 0.3 05/26/2021 0800   BILITOT 0.5 05/19/2021 1015   GFRNONAA >60 05/26/2021 0800   GFRNONAA >60 05/19/2021 1015   GFRAA >60 09/07/2019 1340     ASSESSMENT and THERAPY PLAN:   Malignant neoplasm of upper-outer quadrant of left breast in female, estrogen receptor positive ([place]) Deferred is a 50 year old woman who is here today to receive her #9/12 weekly paclitaxel neoadjuvantly for her estrogen receptor progesterone receptor positive breast cancer.  #1.  Bilateral breast cancer: She will proceed with chemotherapy today.  She continues to completely deny experiencing any peripheral neuropathy.  We will continue to treat her with paclitaxel.  2.  Peripheral neuropathy: At her last visit we reviewed the risk of peripheral neuropathy and that it can lead to disability and the inability to walk her right with a pencil.  She reassures me that she is not experiencing any numbness or tingling in her fingertips or toes.  She reassures me that she is not having any new pain or electric type pain in her feet.  She verbalizes understanding of the risk of possibly developing peripheral neuropathy with the paclitaxel and is in agreement to proceed.  Overall Chelsea Hansen is tolerating the chemotherapy well.  I have reached out to our nurse  navigators to look into her MRI and appointments next week that are scheduled very closely together.  They will work on getting this adjusted for [employer].  We will see Chelsea Hansen back in 1 week for labs follow-up and her next treatment.   No orders of the defined types were placed in this encounter.   All questions were answered. The patient knows to call the clinic with any problems, questions or concerns. We can certainly see the patient much sooner if necessary.  Total encounter time: 20 minutes in face-to-face visit time, chart review, lab review, care coordination, and documentation of the encounter.  Wilber Bihari, NP 05/26/21 1:08 PM Medical Oncology and Hematology [facility] [place], [place] [address] Tel. [phone]    Fax. [phone]  *Total Encounter Time as defined by the Centers for Medicare and Medicaid Services includes, in addition to the face-to-face time of a patient visit (documented in the note above) non-face-to-face time: obtaining and reviewing outside history, ordering and reviewing medications, tests or procedures, care coordination (communications with other health care professionals or caregivers) and documentation in the medical record.

## 2021-05-27 ENCOUNTER — Other Ambulatory Visit: Payer: Self-pay

## 2021-05-27 ENCOUNTER — Other Ambulatory Visit (HOSPITAL_COMMUNITY): Payer: Self-pay

## 2021-05-27 DIAGNOSIS — F0631 Mood disorder due to known physiological condition with depressive features: Secondary | ICD-10-CM | POA: Insufficient documentation

## 2021-05-27 DIAGNOSIS — I10 Essential (primary) hypertension: Secondary | ICD-10-CM | POA: Insufficient documentation

## 2021-05-27 DIAGNOSIS — K219 Gastro-esophageal reflux disease without esophagitis: Secondary | ICD-10-CM | POA: Insufficient documentation

## 2021-05-27 DIAGNOSIS — E785 Hyperlipidemia, unspecified: Secondary | ICD-10-CM | POA: Insufficient documentation

## 2021-05-27 MED ORDER — LORAZEPAM 1 MG PO TABS: 1.0000 mg | ORAL_TABLET | ORAL | 0 refills | Status: DC | PRN

## 2021-05-27 MED ORDER — LORAZEPAM 1 MG PO TABS
1.0000 mg | ORAL_TABLET | ORAL | 0 refills | Status: DC
  Filled 2021-05-27: qty 2, 1d supply, fill #0

## 2021-05-27 NOTE — Progress Notes (Signed)
Per [name], rx phoned in to [facility] for pt to take prior to MRI this Friday, 12/9.  I called pt and instructed her on taking 1 tablet 1 hour prior to procedure.  I instructed pt that she must have a driver to take her to the scan and to drive her home.  Pt verbalized understanding and thanks.

## 2021-05-28 ENCOUNTER — Other Ambulatory Visit: Payer: Self-pay

## 2021-05-28 ENCOUNTER — Ambulatory Visit (INDEPENDENT_AMBULATORY_CARE_PROVIDER_SITE_OTHER): Payer: Medicaid Other | Admitting: Clinical

## 2021-05-28 DIAGNOSIS — F333 Major depressive disorder, recurrent, severe with psychotic symptoms: Secondary | ICD-10-CM | POA: Diagnosis not present

## 2021-05-29 ENCOUNTER — Other Ambulatory Visit: Payer: Medicaid Other

## 2021-05-29 ENCOUNTER — Other Ambulatory Visit: Payer: Self-pay | Admitting: *Deleted

## 2021-06-01 ENCOUNTER — Other Ambulatory Visit: Payer: Self-pay | Admitting: *Deleted

## 2021-06-02 ENCOUNTER — Inpatient Hospital Stay (HOSPITAL_BASED_OUTPATIENT_CLINIC_OR_DEPARTMENT_OTHER): Payer: Medicaid Other | Admitting: Adult Health

## 2021-06-02 ENCOUNTER — Inpatient Hospital Stay: Payer: Medicaid Other

## 2021-06-02 ENCOUNTER — Other Ambulatory Visit: Payer: Medicaid Other

## 2021-06-02 ENCOUNTER — Inpatient Hospital Stay: Payer: Medicaid Other | Admitting: Oncology

## 2021-06-02 ENCOUNTER — Encounter: Payer: Self-pay | Admitting: Adult Health

## 2021-06-02 ENCOUNTER — Other Ambulatory Visit: Payer: Self-pay

## 2021-06-02 VITALS — BP 139/81 | HR 77 | Temp 97.4°F | Resp 18 | Ht 71.0 in | Wt 218.7 lb

## 2021-06-02 DIAGNOSIS — C50412 Malignant neoplasm of upper-outer quadrant of left female breast: Secondary | ICD-10-CM

## 2021-06-02 DIAGNOSIS — C50411 Malignant neoplasm of upper-outer quadrant of right female breast: Secondary | ICD-10-CM

## 2021-06-02 DIAGNOSIS — Z170 Estrogen receptor positive status [ER+]: Secondary | ICD-10-CM | POA: Diagnosis not present

## 2021-06-02 DIAGNOSIS — Z95828 Presence of other vascular implants and grafts: Secondary | ICD-10-CM

## 2021-06-02 DIAGNOSIS — Z5111 Encounter for antineoplastic chemotherapy: Secondary | ICD-10-CM | POA: Diagnosis not present

## 2021-06-02 LAB — COMPREHENSIVE METABOLIC PANEL
ALT: 26 U/L (ref 0–44)
AST: 18 U/L (ref 15–41)
Albumin: 3.9 g/dL (ref 3.5–5.0)
Alkaline Phosphatase: 94 U/L (ref 38–126)
Anion gap: 10 (ref 5–15)
BUN: 13 mg/dL (ref 6–20)
CO2: 24 mmol/L (ref 22–32)
Calcium: 9.2 mg/dL (ref 8.9–10.3)
Chloride: 106 mmol/L (ref 98–111)
Creatinine, Ser: 0.79 mg/dL (ref 0.44–1.00)
GFR, Estimated: >60 mL/min (ref >60)
Glucose, Bld: 98 mg/dL (ref 70–99)
Potassium: 3.8 mmol/L (ref 3.5–5.1)
Sodium: 140 mmol/L (ref 135–145)
Total Bilirubin: 0.3 mg/dL (ref 0.3–1.2)
Total Protein: 7.2 g/dL (ref 6.5–8.1)

## 2021-06-02 LAB — CBC WITH DIFFERENTIAL/PLATELET
Abs Immature Granulocytes: 0.03 10*3/uL (ref 0.00–0.07)
Basophils Absolute: 0.1 10*3/uL (ref 0.0–0.1)
Basophils Relative: 1 %
Eosinophils Absolute: 0.1 10*3/uL (ref 0.0–0.5)
Eosinophils Relative: 2 %
HCT: 30 % — ABNORMAL LOW (ref 36.0–46.0)
Hemoglobin: 9.5 g/dL — ABNORMAL LOW (ref 12.0–15.0)
Immature Granulocytes: 1 %
Lymphocytes Relative: 29 %
Lymphs Abs: 1.3 10*3/uL (ref 0.7–4.0)
MCH: 25.6 pg — ABNORMAL LOW (ref 26.0–34.0)
MCHC: 31.7 g/dL (ref 30.0–36.0)
MCV: 80.9 fL (ref 80.0–100.0)
Monocytes Absolute: 0.3 10*3/uL (ref 0.1–1.0)
Monocytes Relative: 6 %
Neutro Abs: 2.8 10*3/uL (ref 1.7–7.7)
Neutrophils Relative %: 61 %
Platelets: 386 10*3/uL (ref 150–400)
RBC: 3.71 MIL/uL — ABNORMAL LOW (ref 3.87–5.11)
RDW: 16.1 % — ABNORMAL HIGH (ref 11.5–15.5)
WBC: 4.6 10*3/uL (ref 4.0–10.5)
nRBC: 0 % (ref 0.0–0.2)

## 2021-06-02 MED ORDER — SODIUM CHLORIDE 0.9% FLUSH
10.0000 mL | INTRAVENOUS | Status: DC | PRN
  Administered 2021-06-02: 10 mL

## 2021-06-02 MED ORDER — SODIUM CHLORIDE 0.9 % IV SOLN
80.0000 mg/m2 | Freq: Once | INTRAVENOUS | Status: AC
  Administered 2021-06-02: 180 mg via INTRAVENOUS
  Filled 2021-06-02: qty 30

## 2021-06-02 MED ORDER — SODIUM CHLORIDE 0.9 % IV SOLN
Freq: Once | INTRAVENOUS | Status: AC
Start: 2021-06-02 — End: 2021-06-02

## 2021-06-02 MED ORDER — SODIUM CHLORIDE 0.9% FLUSH
10.0000 mL | Freq: Once | INTRAVENOUS | Status: AC
  Administered 2021-06-02: 10 mL

## 2021-06-02 MED ORDER — HEPARIN SOD (PORK) LOCK FLUSH 100 UNIT/ML IV SOLN
500.0000 [IU] | Freq: Once | INTRAVENOUS | Status: AC | PRN
  Administered 2021-06-02: 500 [IU]

## 2021-06-02 MED ORDER — FAMOTIDINE 20 MG IN NS 100 ML IVPB
20.0000 mg | Freq: Once | INTRAVENOUS | Status: AC
  Administered 2021-06-02: 20 mg via INTRAVENOUS
  Filled 2021-06-02: qty 100

## 2021-06-02 MED ORDER — DIPHENHYDRAMINE HCL 50 MG/ML IJ SOLN
25.0000 mg | Freq: Once | INTRAMUSCULAR | Status: AC
  Administered 2021-06-02: 25 mg via INTRAVENOUS
  Filled 2021-06-02: qty 1

## 2021-06-02 MED ORDER — DEXAMETHASONE SODIUM PHOSPHATE 10 MG/ML IJ SOLN
4.0000 mg | Freq: Once | INTRAMUSCULAR | Status: AC
  Administered 2021-06-02: 4 mg via INTRAVENOUS
  Filled 2021-06-02: qty 1

## 2021-06-02 NOTE — Patient Instructions (Signed)
[facility]  Discharge Instructions: Thank you for choosing [facility] to provide your oncology and hematology care.   If you have a lab appointment with the [facility], please go directly to the [facility] and check in at the registration area.   Wear comfortable clothing and clothing appropriate for easy access to any Portacath or PICC line.   We strive to give you quality time with your provider. You may need to reschedule your appointment if you arrive late (15 or more minutes).  Arriving late affects you and other patients whose appointments are after yours.  Also, if you miss three or more appointments without notifying the office, you may be dismissed from the clinic at the providers discretion.      For prescription refill requests, have your pharmacy contact our office and allow 72 hours for refills to be completed.    Today you received the following chemotherapy and/or immunotherapy agents Taxol      To help prevent nausea and vomiting after your treatment, we encourage you to take your nausea medication as directed.  BELOW ARE SYMPTOMS THAT SHOULD BE REPORTED IMMEDIATELY: *FEVER GREATER THAN 100.4 F (38 C) OR HIGHER *CHILLS OR SWEATING *NAUSEA AND VOMITING THAT IS NOT CONTROLLED WITH YOUR NAUSEA MEDICATION *UNUSUAL SHORTNESS OF BREATH *UNUSUAL BRUISING OR BLEEDING *URINARY PROBLEMS (pain or burning when urinating, or frequent urination) *BOWEL PROBLEMS (unusual diarrhea, constipation, pain near the anus) TENDERNESS IN MOUTH AND THROAT WITH OR WITHOUT PRESENCE OF ULCERS (sore throat, sores in mouth, or a toothache) UNUSUAL RASH, SWELLING OR PAIN  UNUSUAL VAGINAL DISCHARGE OR ITCHING   Items with * indicate a potential emergency and should be followed up as soon as possible or go to the Emergency Department if any problems should occur.  Please show the CHEMOTHERAPY ALERT CARD or IMMUNOTHERAPY ALERT CARD at check-in to the  Emergency Department and triage nurse.  Should you have questions after your visit or need to cancel or reschedule your appointment, please contact [facility]  Dept: [phone]  and follow the prompts.  Office hours are 8:00 a.m. to 4:30 p.m. Monday - Friday. Please note that voicemails left after 4:00 p.m. may not be returned until the following business day.  We are closed weekends and major holidays. You have access to a nurse at all times for urgent questions. Please call the main number to the clinic Dept: [phone] and follow the prompts.   For any non-urgent questions, you may also contact your provider using MyChart. We now offer e-Visits for anyone 4 and older to request care online for non-urgent symptoms. For details visit [email or web address].   Also download the MyChart app! Go to the app store, search "MyChart", open the app, select [facility], and log in with your MyChart username and password.  Due to Covid, a mask is required upon entering the hospital/clinic. If you do not have a mask, one will be given to you upon arrival. For doctor visits, patients may have 1 support person aged 33 or older with them. For treatment visits, patients cannot have anyone with them due to current Covid guidelines and our immunocompromised population.

## 2021-06-02 NOTE — Progress Notes (Signed)
Chelsea [facility] Cancer Follow up:    Chelsea Perna, NP [place] [address]   DIAGNOSIS:  Cancer Staging  Malignant neoplasm of upper-outer quadrant of left breast in female, estrogen receptor positive ([place]) Staging form: Breast, AJCC 8th Edition - Clinical stage from 12/17/2020: cT1b, cN0, cM0, ER+, PR+, HER2- - Signed by [name], MD on 12/17/2020 Stage prefix: Initial diagnosis Laterality: Left Staged by: Pathologist and managing physician Stage used in treatment planning: Yes National guidelines used in treatment planning: Yes Type of national guideline used in treatment planning: NCCN  Malignant neoplasm of upper-outer quadrant of right breast in female, estrogen receptor positive ([place]) Staging form: Breast, AJCC 8th Edition - Clinical stage from 12/17/2020: cT2, cN1, cM0, ER+, PR+, HER2- - Signed by [name], MD on 12/17/2020 Stage prefix: Initial diagnosis Laterality: Right Staged by: Pathologist and managing physician Stage used in treatment planning: Yes National guidelines used in treatment planning: Yes Type of national guideline used in treatment planning: NCCN   SUMMARY OF ONCOLOGIC HISTORY: Oncology History  Malignant neoplasm of upper-outer quadrant of right breast in female, estrogen receptor positive ([place])  12/12/2020 Initial Diagnosis   status post bilateral breast biopsies 12/03/2020, showing             (1) on the right, a clinical T2 N1, stage IIa invasive lobular carcinoma, grade 2, estrogen and progesterone receptor strongly positive, HER2 not amplified, with an MIB-1 of 40%                         (a) the biopsied right axillary lymph node was positive with extracapsular extension                         (b) a second right breast mass also biopsied was a fibroadenoma, concordant  MAMMAPRINT tested on biopsy returned high risk, luminal type B, indicating significant benefit from chemo                          (c) biopsy of an area of non-mass-like enhancement in the upper right breast pending   12/17/2020 Cancer Staging   Staging form: Breast, AJCC 8th Edition - Clinical stage from 12/17/2020: cT2, cN1, cM0, ER+, PR+, HER2- - Signed by [name], MD on 12/17/2020 Stage prefix: Initial diagnosis Laterality: Right Staged by: Pathologist and managing physician Stage used in treatment planning: Yes National guidelines used in treatment planning: Yes Type of national guideline used in treatment planning: NCCN    12/24/2020 Genetic Testing   Negative genetic testing:  No pathogenic variants detected on the [employer] BRCAplus panel (report date 12/24/2020) or the CancerNext-Expanded + RNAinsight panel (report date 12/31/2020). 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   The BRCAplus panel offered by [employer] and includes sequencing and deletion/duplication analysis for the following 8 genes: ATM, BRCA1, BRCA2, CDH1, CHEK2, PALB2, PTEN, and TP53. The CancerNext-Expanded + RNAinsight gene panel offered by [employer] and includes sequencing and rearrangement analysis for the following 77 genes: AIP, ALK, APC, ATM, AXIN2, BAP1, BARD1, BLM, BMPR1A, BRCA1, BRCA2, BRIP1, CDC73, CDH1, CDK4, CDKN1B, CDKN2A, CHEK2, CTNNA1, DICER1, FANCC, FH, FLCN, GALNT12, KIF1B, LZTR1, MAX, MEN1, MET, MLH1, MSH2, MSH3, MSH6, MUTYH, NBN, NF1, NF2, NTHL1, PALB2, PHOX2B, PMS2, POT1, PRKAR1A, PTCH1, PTEN, RAD51C, RAD51D, RB1, RECQL, RET, SDHA, SDHAF2, SDHB, SDHC, SDHD,  SMAD4, SMARCA4, SMARCB1, SMARCE1, STK11, SUFU, TMEM127, TP53, TSC1, TSC2, VHL and XRCC2 (sequencing and deletion/duplication); EGFR, EGLN1, HOXB13, KIT, MITF, PDGFRA, POLD1 and POLE (sequencing only); EPCAM and GREM1 (deletion/duplication only). RNA data is routinely analyzed for use in variant interpretation for all genes.   01/27/2021 -  Neo-Adjuvant Chemotherapy   Neoadjuvant chemotherapy with Doxorubicin and  Cyclophosphamide given x 4 beginning 01/27/2021 and completing on 03/10/2021 followed by weekly paclitaxel x 12 beginning 03/24/2021   06/2021 Surgery   Surgery to follow neoadjuvant chemotherapy   07/2021 -  Radiation Therapy   Adjuvant radiation to follow surgery   09/2021 -  Anti-estrogen oral therapy   To follow radiation therapy   Malignant neoplasm of upper-outer quadrant of left breast in female, estrogen receptor positive ([place])  12/12/2020 Initial Diagnosis   on the left, a clinical T1b N0, stage IA invasive ductal carcinoma, grade 1 or 2, estrogen and progesterone receptor positive, HER2 not amplified, with an MIB 1 of 15%   12/17/2020 Cancer Staging   Staging form: Breast, AJCC 8th Edition - Clinical stage from 12/17/2020: cT1b, cN0, cM0, ER+, PR+, HER2- - Signed by [name], MD on 12/17/2020 Stage prefix: Initial diagnosis Laterality: Left Staged by: Pathologist and managing physician Stage used in treatment planning: Yes National guidelines used in treatment planning: Yes Type of national guideline used in treatment planning: NCCN    12/24/2020 Genetic Testing   Negative genetic testing:  No pathogenic variants detected on the [facility] BRCAplus panel (report date 12/24/2020) or the CancerNext-Expanded + RNAinsight panel (report date 12/31/2020). 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   The BRCAplus panel offered by [employer] and includes sequencing and deletion/duplication analysis for the following 8 genes: ATM, BRCA1, BRCA2, CDH1, CHEK2, PALB2, PTEN, and TP53. The CancerNext-Expanded + RNAinsight gene panel offered by [employer] and includes sequencing and rearrangement analysis for the following 77 genes: AIP, ALK, APC, ATM, AXIN2, BAP1, BARD1, BLM, BMPR1A, BRCA1, BRCA2, BRIP1, CDC73, CDH1, CDK4, CDKN1B, CDKN2A, CHEK2, CTNNA1, DICER1, FANCC, FH, FLCN, GALNT12, KIF1B, LZTR1, MAX, MEN1, MET, MLH1, MSH2, MSH3, MSH6,  MUTYH, NBN, NF1, NF2, NTHL1, PALB2, PHOX2B, PMS2, POT1, PRKAR1A, PTCH1, PTEN, RAD51C, RAD51D, RB1, RECQL, RET, SDHA, SDHAF2, SDHB, SDHC, SDHD, SMAD4, SMARCA4, SMARCB1, SMARCE1, STK11, SUFU, TMEM127, TP53, TSC1, TSC2, VHL and XRCC2 (sequencing and deletion/duplication); EGFR, EGLN1, HOXB13, KIT, MITF, PDGFRA, POLD1 and POLE (sequencing only); EPCAM and GREM1 (deletion/duplication only). RNA data is routinely analyzed for use in variant interpretation for all genes.   01/27/2021 -  Neo-Adjuvant Chemotherapy   Neoadjuvant chemotherapy with Doxorubicin and Cyclophosphamide given x 4 beginning 01/27/2021 and completing on 03/10/2021 followed by weekly paclitaxel x 12 beginning 03/24/2021   06/2021 Surgery   Surgery to follow neoadjuvant chemotherapy   07/2021 -  Radiation Therapy   Adjuvant radiation to follow surgery   09/2021 -  Anti-estrogen oral therapy   To follow radiation therapy     CURRENT THERAPY: Taxol  INTERVAL HISTORY: Chelsea Hansen 50 y.o. female returns for evaluation prior to weekly Taxol.  She is here for week 10 of treatment.  She denies any peripheral neuropathy.  She says her energy level is good and she denies any increased fatigue other than mild fatigue.  She notes she is seeing the pain clinic tomorrow and has an appointment with Dr. Ninfa Linden on Friday, 06/05/2021.  She did not have her breast    Patient Active Problem List   Diagnosis Date  Noted   Pain management contract signed 05/04/2021   Chronic pain syndrome (breast cancer) 05/04/2021   Palpitations 03/25/2021   Sinus bradycardia 03/25/2021   Daytime somnolence 03/25/2021   Snoring 03/25/2021   Obesity (BMI 30-39.9) 03/25/2021   Port-A-Cath in place 01/27/2021   Genetic testing 12/24/2020   Family history of uterine cancer 12/17/2020   Malignant neoplasm of upper-outer quadrant of right breast in female, estrogen receptor positive ([place]) 12/12/2020   Malignant neoplasm of upper-outer quadrant of left breast in  female, estrogen receptor positive ([place]) 12/12/2020   Hypokalemia 01/02/2012   Asthma 01/01/2012   Bradycardia 01/01/2012   Syncope 12/31/2011   Mediastinal adenopathy 12/31/2011   Anemia 12/31/2011   Chest pain 12/31/2011    has No Known Allergies.  MEDICAL HISTORY: Past Medical History:  Diagnosis Date   Allergy    seasonal allergies   Anxiety    on meds   Asthma    uses inhaler   Breast cancer ([place]) 2012   RIGHT lumpectomy   Cancer ([place]) 2022   RIGHT breast lump-dx 2022   Depression    on meds   DVT (deep venous thrombosis) ([place]) 2010   after hysterectomy   Family history of uterine cancer    GERD (gastroesophageal reflux disease)    with certain foods/OTC PRN meds   Headache(784.0)    Hyperlipidemia    on meds   Hypertension    on meds    SURGICAL HISTORY: Past Surgical History:  Procedure Laterality Date   ABDOMINAL HYSTERECTOMY     BREAST EXCISIONAL BIOPSY Right 09/16/2009   BREAST SURGERY     lumpectomy   PORTACATH PLACEMENT Left 01/26/2021   Procedure: INSERTION PORT-A-CATH;  Surgeon: [name], MD;  Location: [facility];  Service: General;  Laterality: Left;   TUBAL LIGATION      SOCIAL HISTORY: Social History   Socioeconomic History   Marital status: Single    Spouse name: Not on file   Number of children: Not on file   Years of education: Not on file   Highest education level: Not on file  Occupational History   Not on file  Tobacco Use   Smoking status: Never   Smokeless tobacco: Never  Vaping Use   Vaping Use: Never used  Substance and Sexual Activity   Alcohol use: Not Currently    Alcohol/week: 7.0 standard drinks    Types: 7 Standard drinks or equivalent per week    Comment: occassional   Drug use: No   Sexual activity: Not Currently  Other Topics Concern   Not on file  Social History Narrative   Not on file   Social Determinants of Health   Financial Resource Strain: High Risk   Difficulty of Paying Living Expenses:  Hard  Food Insecurity: Food Insecurity Present   Worried About Running Out of Food in the Last Year: Sometimes true   Ran Out of Food in the Last Year: Sometimes true  Transportation Needs: No Transportation Needs   Lack of Transportation (Medical): No   Lack of Transportation (Non-Medical): No  Physical Activity: Not on file  Stress: Not on file  Social Connections: Not on file  Intimate Partner Violence: Not on file    FAMILY HISTORY: Family History  Problem Relation Age of Onset   Cancer Mother        ?, dx <50   Hypertension Father    Cancer Maternal Aunt        uterine vs ovarian, dx <  46   Cancer Maternal Aunt        unknown type, dx <50   Uterine cancer Cousin 50   Colon polyps Neg Hx    Colon cancer Neg Hx    Esophageal cancer Neg Hx    Stomach cancer Neg Hx     Review of Systems  Constitutional:  Negative for appetite change, chills, fatigue, fever and unexpected weight change.  HENT:   Negative for hearing loss, lump/mass and trouble swallowing.   Eyes:  Negative for eye problems and icterus.  Respiratory:  Negative for chest tightness, cough and shortness of breath.   Cardiovascular:  Negative for chest pain, leg swelling and palpitations.  Gastrointestinal:  Negative for abdominal distention, abdominal pain, constipation, diarrhea, nausea and vomiting.  Endocrine: Negative for hot flashes.  Genitourinary:  Negative for difficulty urinating.   Musculoskeletal:  Negative for arthralgias.  Skin:  Negative for itching and rash.  Neurological:  Negative for dizziness, extremity weakness, headaches and numbness.  Hematological:  Negative for adenopathy. Does not bruise/bleed easily.  Psychiatric/Behavioral:  Negative for depression. The patient is not nervous/anxious.      PHYSICAL EXAMINATION  ECOG PERFORMANCE STATUS: 1 - Symptomatic but completely ambulatory  Vitals:   06/02/21 0943  BP: 139/81  Pulse: 77  Resp: 18  Temp: (!) 97.4 F (36.3 C)  SpO2:  100%    Physical Exam Constitutional:      General: She is not in acute distress.    Appearance: Normal appearance. She is not toxic-appearing.  HENT:     Head: Normocephalic and atraumatic.  Eyes:     General: No scleral icterus. Cardiovascular:     Rate and Rhythm: Normal rate and regular rhythm.     Pulses: Normal pulses.     Heart sounds: Normal heart sounds.  Pulmonary:     Effort: Pulmonary effort is normal.     Breath sounds: Normal breath sounds.  Abdominal:     General: Abdomen is flat. Bowel sounds are normal. There is no distension.     Palpations: Abdomen is soft.     Tenderness: There is no abdominal tenderness.  Musculoskeletal:        General: No swelling.     Cervical back: Neck supple.  Lymphadenopathy:     Cervical: No cervical adenopathy.  Skin:    General: Skin is warm and dry.     Findings: No rash.  Neurological:     General: No focal deficit present.     Mental Status: She is alert.  Psychiatric:        Mood and Affect: Mood normal.        Behavior: Behavior normal.    LABORATORY DATA:  CBC    Component Value Date/Time   WBC 4.6 06/02/2021 0916   RBC 3.71 (L) 06/02/2021 0916   HGB 9.5 (L) 06/02/2021 0916   HGB 8.3 (L) 03/02/2021 1533   HGB 11.5 10/06/2020 1432   HCT 30.0 (L) 06/02/2021 0916   HCT 36.8 10/06/2020 1432   PLT 386 06/02/2021 0916   PLT 268 03/02/2021 1533   PLT 323 10/06/2020 1432   MCV 80.9 06/02/2021 0916   MCV 73 (L) 10/06/2020 1432   MCH 25.6 (L) 06/02/2021 0916   MCHC 31.7 06/02/2021 0916   RDW 16.1 (H) 06/02/2021 0916   RDW 15.9 (H) 10/06/2020 1432   LYMPHSABS 1.3 06/02/2021 0916   LYMPHSABS 1.9 10/06/2020 1432   MONOABS 0.3 06/02/2021 0916   EOSABS 0.1 06/02/2021 1062  EOSABS 0.1 10/06/2020 1432   BASOSABS 0.1 06/02/2021 0916   BASOSABS 0.0 10/06/2020 1432    CMP     Component Value Date/Time   NA 140 06/02/2021 0916   NA 139 10/06/2020 1432   K 3.8 06/02/2021 0916   CL 106 06/02/2021 0916   CO2 24  06/02/2021 0916   GLUCOSE 98 06/02/2021 0916   BUN 13 06/02/2021 0916   BUN 11 10/06/2020 1432   CREATININE 0.79 06/02/2021 0916   CREATININE 0.76 05/19/2021 1015   CALCIUM 9.2 06/02/2021 0916   PROT 7.2 06/02/2021 0916   PROT 6.9 10/06/2020 1432   ALBUMIN 3.9 06/02/2021 0916   ALBUMIN 4.1 10/06/2020 1432   AST 18 06/02/2021 0916   AST 22 05/19/2021 1015   ALT 26 06/02/2021 0916   ALT 21 05/19/2021 1015   ALKPHOS 94 06/02/2021 0916   BILITOT 0.3 06/02/2021 0916   BILITOT 0.5 05/19/2021 1015   GFRNONAA >60 06/02/2021 0916   GFRNONAA >60 05/19/2021 1015   GFRAA >60 09/07/2019 1340    ASSESSMENT and THERAPY PLAN:   Malignant neoplasm of upper-outer quadrant of left breast in female, estrogen receptor positive ([place]) Deferred is a 50 year old woman who is here today to receive her #9/12 weekly paclitaxel neoadjuvantly for her estrogen receptor progesterone receptor positive breast cancer.  #1.  Bilateral breast cancer: She will proceed with chemotherapy today.  She continues to completely deny experiencing any peripheral neuropathy.  We will continue to treat her with paclitaxel.  She will proceed with #10 today.  We discussed her post neoadjuvant MRI.  I called her healthcare insurance company and completed the peer-to-peer.  They were under the impression that she was undergoing the MRI for diagnostic and screening purposes.  I clarified that it is post neoadjuvant chemotherapy evaluation for surgical planning and it was approved.  I gave her breast navigator Varney Biles the authorization number and she is calling to get this rescheduled so it can be completed before Chelsea Hansen follows up with Dr. Ninfa Linden on Friday.  I apologized to [facility] for the delay and inconvenience, and assured her that we would get things fixed.   All questions were answered. The patient knows to call the clinic with any problems, questions or concerns. We can certainly see the patient much sooner if necessary.  Total  encounter time: 45 minutes in face-to-face visit time, chart review, lab review, care coordination, discussion with insurance company physician reviewer, discussion with medical oncologist, discussion with nurse navigator, and documentation of the encounter.  Wilber Bihari, NP 06/02/21 11:37 AM Medical Oncology and Hematology [facility] [place], [place] [address] Tel. [phone]    Fax. [phone]  *Total Encounter Time as defined by the Centers for Medicare and Medicaid Services includes, in addition to the face-to-face time of a patient visit (documented in the note above) non-face-to-face time: obtaining and reviewing outside history, ordering and reviewing medications, tests or procedures, care coordination (communications with other health care professionals or caregivers) and documentation in the medical record.

## 2021-06-02 NOTE — Assessment & Plan Note (Signed)
Deferred is a 50 year old woman who is here today to receive her #9/12 weekly paclitaxel neoadjuvantly for her estrogen receptor progesterone receptor positive breast cancer.  #1.  Bilateral breast cancer: She will proceed with chemotherapy today.  She continues to completely deny experiencing any peripheral neuropathy.  We will continue to treat her with paclitaxel.  She will proceed with #10 today.  We discussed her post neoadjuvant MRI.  I called her healthcare insurance company and completed the peer-to-peer.  They were under the impression that she was undergoing the MRI for diagnostic and screening purposes.  I clarified that it is post neoadjuvant chemotherapy evaluation for surgical planning and it was approved.  I gave her breast navigator Varney Biles the authorization number and she is calling to get this rescheduled so it can be completed before [place] follows up with Dr. Ninfa Linden on Friday.  I apologized to [facility] for the delay and inconvenience, and assured her that we would get things fixed.

## 2021-06-03 ENCOUNTER — Other Ambulatory Visit: Payer: Self-pay

## 2021-06-03 ENCOUNTER — Other Ambulatory Visit (HOSPITAL_COMMUNITY): Payer: Self-pay

## 2021-06-03 ENCOUNTER — Ambulatory Visit
Payer: Medicaid Other | Attending: Student in an Organized Health Care Education/Training Program | Admitting: Student in an Organized Health Care Education/Training Program

## 2021-06-03 ENCOUNTER — Encounter: Payer: Self-pay | Admitting: Student in an Organized Health Care Education/Training Program

## 2021-06-03 VITALS — BP 150/92 | HR 63 | Temp 97.5°F | Resp 18 | Ht 71.0 in | Wt 218.0 lb

## 2021-06-03 DIAGNOSIS — C50412 Malignant neoplasm of upper-outer quadrant of left female breast: Secondary | ICD-10-CM | POA: Insufficient documentation

## 2021-06-03 DIAGNOSIS — Z170 Estrogen receptor positive status [ER+]: Secondary | ICD-10-CM | POA: Insufficient documentation

## 2021-06-03 DIAGNOSIS — E669 Obesity, unspecified: Secondary | ICD-10-CM | POA: Insufficient documentation

## 2021-06-03 DIAGNOSIS — G894 Chronic pain syndrome: Secondary | ICD-10-CM | POA: Insufficient documentation

## 2021-06-03 DIAGNOSIS — C50411 Malignant neoplasm of upper-outer quadrant of right female breast: Secondary | ICD-10-CM | POA: Diagnosis not present

## 2021-06-03 DIAGNOSIS — Z0289 Encounter for other administrative examinations: Secondary | ICD-10-CM | POA: Insufficient documentation

## 2021-06-03 MED ORDER — GABAPENTIN 300 MG PO CAPS
ORAL_CAPSULE | ORAL | 2 refills | Status: DC
  Filled 2021-06-03: qty 90, 30d supply, fill #0
  Filled 2021-07-08: qty 90, 30d supply, fill #1

## 2021-06-03 MED ORDER — HYDROCODONE-ACETAMINOPHEN 7.5-325 MG PO TABS
1.0000 | ORAL_TABLET | Freq: Three times a day (TID) | ORAL | 0 refills | Status: AC | PRN
  Filled 2021-06-03 (×2): qty 90, 30d supply, fill #0

## 2021-06-03 MED ORDER — HYDROCODONE-ACETAMINOPHEN 7.5-325 MG PO TABS
1.0000 | ORAL_TABLET | Freq: Three times a day (TID) | ORAL | 0 refills | Status: DC | PRN
  Filled 2021-07-08: qty 90, 30d supply, fill #0

## 2021-06-03 NOTE — Progress Notes (Signed)
Nursing Pain Medication Assessment:  Safety precautions to be maintained throughout the outpatient stay will include: orient to surroundings, keep bed in low position, maintain call bell within reach at all times, provide assistance with transfer out of bed and ambulation.  Medication Inspection Compliance: Pill count conducted under aseptic conditions, in front of the patient. Neither the pills nor the bottle was removed from the patient's sight at any time. Once count was completed pills were immediately returned to the patient in their original bottle.  Medication: Hydrocodone/APAP Pill/Patch Count:  5 of 90 pills remain Pill/Patch Appearance: Markings consistent with prescribed medication Bottle Appearance: Standard pharmacy container. Clearly labeled. Filled Date: 71 / 15 / 2022 Last Medication intake:  Today

## 2021-06-03 NOTE — Progress Notes (Signed)
PROVIDER NOTE: Information contained herein reflects review and annotations entered in association with encounter. Interpretation of such information and data should be left to medically-trained personnel. Information provided to patient can be located elsewhere in the medical record under "Patient Instructions". Document created using STT-dictation technology, any transcriptional errors that may result from process are unintentional.    Patient: Chelsea Hansen  Service Category: E/M  Provider: Gillis Santa, MD  DOB: [birth year]  DOS: 06/03/2021  Specialty: Interventional Pain Management  MRN: [id number]  Setting: Ambulatory outpatient  PCP: [name], NP  Type: Established Patient    Referring Provider: [name], NP  Location: Office  Delivery: Face-to-face     HPI  [name], a 50 y.o. year old female, is here today because of her Chronic pain syndrome [G89.4]. 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 Pertinent problems: Chelsea Hansen has Malignant neoplasm of upper-outer quadrant of right breast in female, estrogen receptor positive ([place]); Malignant neoplasm of upper-outer quadrant of left breast in female, estrogen receptor positive ([place]); Obesity (BMI 30-39.9); Pain management contract signed; and Chronic pain syndrome (breast cancer) on their pertinent problem list. Pain Assessment: Severity of Chronic pain is reported as a 7 /10. Location:  (all over due to neuropathy)  / . Onset: More than a month ago. Quality: Constant, Aching, Burning, Throbbing, Sharp, Shooting. Timing: Constant. Modifying factor(s): rest, heat, medications. Vitals:  height is '5\' 11"'  (1.803 m) and weight is 218 lb (98.9 kg). Her temporal temperature is 97.5 F (36.4 C) (abnormal). Her blood pressure is 150/92 (abnormal) and her pulse is 63. Her respiration is 18 and oxygen saturation is 100%.   Reason for  encounter: medication management.   Is finding some benefit with hydrocodone compared to tramadol although continues to have pretty significant pain.  She was unable to have her gabapentin filled for unclear reason.  I have sent this in to her pharmacy again.  She states that she had an unpleasant experience with her pharmacist last time.  I will also increase hydrocodone to 7.5 mg 3 times daily as needed to optimize pain management.   05/04/21 Second patient visit today.  UDS up-to-date and appropriate.  Patient states that she would have headaches and nausea/vomiting with tramadol.  She continues chemotherapy weekly.  She is doing psychotherapy for depression and anxiety every 2 weeks.  She was started on gabapentin at 300 mg twice daily at her last visit.  She is not noticing any significant improvement with gabapentin.  She does have trouble sleeping.  I recommend that she increase her nighttime dose to 600 mg nightly and continue 350mlligrams during the day.  Given side effects with tramadol, will trial hydrocodone as below.  Risks and benefits reviewed with patient.  She will sign pain contract today.   HPI from initial clinic visit on 04/23/2021: Chelsea Hansen a pleasant 50year old female who unfortunately was diagnosed with breast cancer this past May.  She is undergoing chemotherapy.  She has a family history of breast cancer and states that her mother had a very young.  She has diffuse whole body pain that is most pronounced in her low back and hips.  She has tried acetaminophen and NSAIDs with limited response.  She was recently prescribed gabapentin at 300 mg which she has yet to start.  She is also seeing a therapist to help with anxiety and depression associated with  her recent diagnosis of cancer.  She has tried tramadol in the past with limited response.  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  Pharmacotherapy  Assessment  Analgesic: Hydrocodone 5--> 7.5 mg TID prn   Monitoring: [place] PMP: PDMP reviewed during this encounter.       Pharmacotherapy: No side-effects or adverse reactions reported. Compliance: No problems identified. Effectiveness: Clinically acceptable.  Hart Rochester, RN  06/03/2021  1:23 PM  Sign when Signing Visit Nursing Pain Medication Assessment:  Safety precautions to be maintained throughout the outpatient stay will include: orient to surroundings, keep bed in low position, maintain call bell within reach at all times, provide assistance with transfer out of bed and ambulation.  Medication Inspection Compliance: Pill count conducted under aseptic conditions, in front of the patient. Neither the pills nor the bottle was removed from the patient's sight at any time. Once count was completed pills were immediately returned to the patient in their original bottle.  Medication: Hydrocodone/APAP Pill/Patch Count:  5 of 90 pills remain Pill/Patch Appearance: Markings consistent with prescribed medication Bottle Appearance: Standard pharmacy container. Clearly labeled. Filled Date: 60 / 15 / 2022 Last Medication intake:  Today    UDS:  Summary  Date Value Ref Range Status  04/08/2021 Note  Final    Comment:    ==================================================================== Compliance Drug Analysis, Ur ==================================================================== Test                             Result       Flag       Units  Drug Present not Declared for Prescription Verification   Diphenhydramine                PRESENT      UNEXPECTED  Drug Absent but Declared for Prescription Verification   Gabapentin                     Not Detected UNEXPECTED   Citalopram                     Not Detected UNEXPECTED   Acetaminophen                  Not Detected UNEXPECTED    Acetaminophen, as indicated in the declared medication list, is not    always detected even when  used as directed.  ==================================================================== Test                      Result    Flag   Units      Ref Range   Creatinine              133              mg/dL      >=20 ==================================================================== Declared Medications:  The flagging and interpretation on this report are based on the  following declared medications.  Unexpected results may arise from  inaccuracies in the declared medications.   **Note: The testing scope of this panel includes these medications:   Escitalopram (Lexapro)  Gabapentin (Neurontin)   **Note: The testing scope of this panel does not include small to  moderate amounts of these reported medications:   Acetaminophen (Tylenol)   **Note: The testing scope of this panel does not include the  following reported medications:  Albuterol (Ventolin HFA)  Amlodipine (Norvasc)  Fluticasone (Flonase)  Hydrochlorothiazide  Loratadine (Claritin)  Montelukast (Singulair)  Multivitamin  Omeprazole (Prilosec)  Vitamin D3 ==================================================================== For clinical consultation, please call [phone]. ====================================================================      ROS  Constitutional: Denies any fever or chills Gastrointestinal: No reported hemesis, hematochezia, vomiting, or acute GI distress Musculoskeletal:  Diffuse arthralgias and myalgias Neurological: No reported episodes of acute onset apraxia, aphasia, dysarthria, agnosia, amnesia, paralysis, loss of coordination, or loss of consciousness  Medication Review  HYDROcodone-acetaminophen, LORazepam, Vitamin D3, acetaminophen, albuterol, amLODipine, dexamethasone, diazepam, escitalopram, fluticasone, gabapentin, hydrochlorothiazide, lidocaine-prilocaine, montelukast, ondansetron, prochlorperazine, promethazine, and propranolol  History Review  Allergy: Chelsea Hansen has No Known  Allergies. Drug: Chelsea Hansen  reports no history of drug use. Alcohol:  reports that she does not currently use alcohol after a past usage of about 7.0 standard drinks per week. Tobacco:  reports that she has never smoked. She has never used smokeless tobacco. Social: Chelsea Hansen  reports that she has never smoked. She has never used smokeless tobacco. She reports that she does not currently use alcohol after a past usage of about 7.0 standard drinks per week. She reports that she does not use drugs. Medical:  has a past medical history of Allergy, Anxiety, Asthma, Breast cancer ([place]) (2012), Cancer ([place]) (2022), Depression, DVT (deep venous thrombosis) ([place]) (2010), Family history of uterine cancer, GERD (gastroesophageal reflux disease), Headache(784.0), Hyperlipidemia, and Hypertension. Surgical: Chelsea Hansen  has a past surgical history that includes Abdominal hysterectomy; Breast surgery; Tubal ligation; Breast excisional biopsy (Right, 09/16/2009); and Portacath placement (Left, 01/26/2021). Family: family history includes Cancer in her maternal aunt, maternal aunt, and mother; Hypertension in her father; Uterine cancer (age of onset: 60) in her cousin.  Laboratory Chemistry Profile   Renal Lab Results  Component Value Date   BUN 13 06/02/2021   CREATININE 0.79 06/02/2021   BCR 15 10/06/2020   GFRAA >60 09/07/2019   GFRNONAA >60 06/02/2021    Hepatic Lab Results  Component Value Date   AST 18 06/02/2021   ALT 26 06/02/2021   ALBUMIN 3.9 06/02/2021   ALKPHOS 94 06/02/2021   LIPASE 21 01/28/2021    Electrolytes Lab Results  Component Value Date   NA 140 06/02/2021   K 3.8 06/02/2021   CL 106 06/02/2021   CALCIUM 9.2 06/02/2021   MG 2.0 01/28/2021    Bone No results found for: VD25OH, VD125OH2TOT, XY8016PV3, ZS8270BE6, 25OHVITD1, 25OHVITD2, 25OHVITD3, TESTOFREE, TESTOSTERONE  Inflammation (CRP: Acute Phase) (ESR: Chronic Phase) Lab Results  Component Value Date   ESRSEDRATE 11  12/31/2011   LATICACIDVEN 0.87 11/01/2013         Note: Above Lab results reviewed.  Recent Imaging Review  LONG TERM MONITOR (3-14 DAYS) Patch Wear Time:  13 days and 20 hours starting March 28, 2021 Indication: Palpitations  Patient had a min HR of 54 bpm, max HR of 214 bpm, and avg HR of 89 bpm. Predominant underlying rhythm was Sinus Rhythm.  5 Supraventricular Tachycardia runs occurred, the run with the fastest  interval lasting 11 beats with a max rate of 214 bpm, the longest lasting  8 beats with an avg rate of 142 bpm.   Premature atrial complexes were rare (<1.0%). Premature ventricular complexes were rare (<1.0%).  No symptoms reported.  Conclusion: This study is remarkable for paroxysmal supraventricular  tachycardia which is likely atrial tachycardia with variable block. Note: Reviewed        Physical Exam  General appearance:  Well nourished, well developed, and well hydrated. In no apparent acute distress Mental status: Alert, oriented x 3 (person, place, & time)       Respiratory: No evidence of acute respiratory distress Eyes: PERLA Vitals: BP (!) 150/92    Pulse 63    Temp (!) 97.5 F (36.4 C) (Temporal)    Resp 18    Ht '5\' 11"'  (1.803 m)    Wt 218 lb (98.9 kg)    SpO2 100%    BMI 30.40 kg/m  BMI: Estimated body mass index is 30.4 kg/m as calculated from the following:   Height as of this encounter: '5\' 11"'  (1.803 m).   Weight as of this encounter: 218 lb (98.9 kg). Ideal: Ideal body weight: 70.8 kg (156 lb 1.4 oz) Adjusted ideal body weight: 82 kg (180 lb 13.6 oz)    Lumbar Spine Area Exam  Skin & Axial Inspection: No masses, redness, or swelling Alignment: Symmetrical Functional ROM: Pain restricted ROM       Stability: No instability detected Muscle Tone/Strength: Functionally intact. No obvious neuro-muscular anomalies detected. Sensory (Neurological): Musculoskeletal pain pattern   Gait & Posture Assessment  Ambulation: Unassisted Gait:  Relatively normal for age and body habitus Posture: WNL  Lower Extremity Exam      Side: Right lower extremity   Side: Left lower extremity  Stability: No instability observed           Stability: No instability observed          Skin & Extremity Inspection: Skin color, temperature, and hair growth are WNL. No peripheral edema or cyanosis. No masses, redness, swelling, asymmetry, or associated skin lesions. No contractures.   Skin & Extremity Inspection: Skin color, temperature, and hair growth are WNL. No peripheral edema or cyanosis. No masses, redness, swelling, asymmetry, or associated skin lesions. No contractures.  Functional ROM: Unrestricted ROM                   Functional ROM: Unrestricted ROM                  Muscle Tone/Strength: Functionally intact. No obvious neuro-muscular anomalies detected.   Muscle Tone/Strength: Functionally intact. No obvious neuro-muscular anomalies detected.  Sensory (Neurological): Unimpaired         Sensory (Neurological): Unimpaired        DTR: Patellar: deferred today Achilles: deferred today Plantar: deferred today   DTR: Patellar: deferred today Achilles: deferred today Plantar: deferred today  Palpation: No palpable anomalies   Palpation: No palpable anomalies       Assessment   Status Diagnosis  Controlled Controlled Controlled 1. Chronic pain syndrome (breast cancer)   2. Pain management contract signed   3. Malignant neoplasm of upper-outer quadrant of right breast in female, estrogen receptor positive ([place])   4. Malignant neoplasm of upper-outer quadrant of left breast in female, estrogen receptor positive ([place])   5. Obesity (BMI 30-39.9)      Updated Problems: Problem  Pain Management Contract Signed  Chronic pain syndrome (breast cancer)  Obesity (Bmi 30-39.9)  Malignant Neoplasm of Upper-Outer Quadrant of Right Breast in Female, Estrogen Receptor Positive ([facility])  Malignant Neoplasm of Upper-Outer Quadrant of Left Breast in  Female, Estrogen Receptor Positive ([facility])    Plan of Care  Problem-specific:  No problem-specific Assessment & Plan notes found for this encounter.  Chelsea Hansen has a current medication list which includes the following long-term medication(s): albuterol, amlodipine, escitalopram, fluticasone, hydrochlorothiazide, montelukast, propranolol,  and gabapentin.  Pharmacotherapy (Medications Ordered): Meds ordered this encounter  Medications   HYDROcodone-acetaminophen (NORCO) 7.5-325 MG tablet    Sig: Take 1 tablet by mouth 3 (three) times daily as needed for severe pain. Must last 30 days    Dispense:  90 tablet    Refill:  0    Chronic Pain: STOP Act (Not applicable) Fill 1 day early if closed on refill date. Avoid benzodiazepines within 8 hours of opioids   HYDROcodone-acetaminophen (NORCO) 7.5-325 MG tablet    Sig: Take 1 tablet by mouth 3 (three) times daily as needed for severe pain. Must last 30 days    Dispense:  90 tablet    Refill:  0    Chronic Pain: STOP Act (Not applicable) Fill 1 day early if closed on refill date. Avoid benzodiazepines within 8 hours of opioids   gabapentin (NEURONTIN) 300 MG capsule    Sig: Take 1 capsule by mouth daily and 2 capsules at bedtime    Dispense:  90 capsule    Refill:  2    Follow-up plan:   Return in about 8 weeks (around 07/29/2021) for Medication Management, in person.    Recent Visits Date Type Provider Dept  05/04/21 Office Visit Gillis Santa, MD [facility]  04/08/21 Office Visit Gillis Santa, MD [facility]  Showing recent visits within past 90 days and meeting all other requirements Today's Visits Date Type Provider Dept  06/03/21 Office Visit Gillis Santa, MD [facility]  Showing today's visits and meeting all other requirements Future Appointments Date Type Provider Dept  07/30/21 Appointment Gillis Santa, MD [facility]  Showing future appointments within next 90 days and  meeting all other requirements I discussed the assessment and treatment plan with the patient. The patient was provided an opportunity to ask questions and all were answered. The patient agreed with the plan and demonstrated an understanding of the instructions.  Patient advised to call back or seek an in-person evaluation if the symptoms or condition worsens.  Duration of encounter: 30  minutes.  Note by: Gillis Santa, MD Date: 06/03/2021; Time: 2:02 PM

## 2021-06-04 ENCOUNTER — Other Ambulatory Visit (HOSPITAL_COMMUNITY): Payer: Self-pay

## 2021-06-04 ENCOUNTER — Other Ambulatory Visit: Payer: Self-pay

## 2021-06-04 MED ORDER — LORAZEPAM 1 MG PO TABS: 1.0000 mg | ORAL_TABLET | Freq: Three times a day (TID) | ORAL | 0 refills | Status: DC

## 2021-06-04 MED ORDER — LORAZEPAM 1 MG PO TABS
1.0000 mg | ORAL_TABLET | ORAL | 0 refills | Status: DC
  Filled 2021-06-04: qty 2, 1d supply, fill #0

## 2021-06-04 NOTE — Progress Notes (Signed)
Phoned in rx for pt scan 12/16 per Val, Pt arrived for scan previously 12/9  having taken her meds and upon arrival pt informed scan was not authorized.

## 2021-06-05 ENCOUNTER — Ambulatory Visit
Admission: RE | Admit: 2021-06-05 | Discharge: 2021-06-05 | Disposition: A | Discharge status: Home / Self Care | Payer: Medicaid Other | Source: Ambulatory Visit | Attending: Oncology | Admitting: Oncology

## 2021-06-05 ENCOUNTER — Other Ambulatory Visit: Payer: Self-pay

## 2021-06-05 ENCOUNTER — Encounter: Payer: Self-pay | Admitting: *Deleted

## 2021-06-05 DIAGNOSIS — C50411 Malignant neoplasm of upper-outer quadrant of right female breast: Secondary | ICD-10-CM

## 2021-06-05 DIAGNOSIS — Z170 Estrogen receptor positive status [ER+]: Secondary | ICD-10-CM

## 2021-06-05 MED ORDER — GADOBUTROL 1 MMOL/ML IV SOLN
20.0000 mL | Freq: Once | INTRAVENOUS | Status: AC | PRN
  Administered 2021-06-05: 20 mL via INTRAVENOUS

## 2021-06-08 ENCOUNTER — Inpatient Hospital Stay (HOSPITAL_BASED_OUTPATIENT_CLINIC_OR_DEPARTMENT_OTHER): Payer: Medicaid Other | Admitting: Adult Health

## 2021-06-08 ENCOUNTER — Inpatient Hospital Stay: Payer: Medicaid Other

## 2021-06-08 ENCOUNTER — Encounter: Payer: Self-pay | Admitting: Adult Health

## 2021-06-08 ENCOUNTER — Other Ambulatory Visit: Payer: Self-pay

## 2021-06-08 ENCOUNTER — Other Ambulatory Visit: Payer: Self-pay | Admitting: Surgery

## 2021-06-08 ENCOUNTER — Encounter: Payer: Self-pay | Admitting: *Deleted

## 2021-06-08 VITALS — BP 170/89 | HR 79 | Temp 96.8°F | Resp 17 | Wt 218.9 lb

## 2021-06-08 DIAGNOSIS — C50412 Malignant neoplasm of upper-outer quadrant of left female breast: Secondary | ICD-10-CM

## 2021-06-08 DIAGNOSIS — Z95828 Presence of other vascular implants and grafts: Secondary | ICD-10-CM

## 2021-06-08 DIAGNOSIS — Z170 Estrogen receptor positive status [ER+]: Secondary | ICD-10-CM

## 2021-06-08 DIAGNOSIS — C50411 Malignant neoplasm of upper-outer quadrant of right female breast: Secondary | ICD-10-CM

## 2021-06-08 DIAGNOSIS — C50912 Malignant neoplasm of unspecified site of left female breast: Secondary | ICD-10-CM

## 2021-06-08 DIAGNOSIS — C50911 Malignant neoplasm of unspecified site of right female breast: Secondary | ICD-10-CM

## 2021-06-08 DIAGNOSIS — Z5111 Encounter for antineoplastic chemotherapy: Secondary | ICD-10-CM | POA: Diagnosis not present

## 2021-06-08 LAB — COMPREHENSIVE METABOLIC PANEL
ALT: 20 U/L (ref 0–44)
AST: 16 U/L (ref 15–41)
Albumin: 3.6 g/dL (ref 3.5–5.0)
Alkaline Phosphatase: 84 U/L (ref 38–126)
Anion gap: 9 (ref 5–15)
BUN: 13 mg/dL (ref 6–20)
CO2: 24 mmol/L (ref 22–32)
Calcium: 8.9 mg/dL (ref 8.9–10.3)
Chloride: 107 mmol/L (ref 98–111)
Creatinine, Ser: 0.75 mg/dL (ref 0.44–1.00)
GFR, Estimated: >60 mL/min (ref >60)
Glucose, Bld: 124 mg/dL — ABNORMAL HIGH (ref 70–99)
Potassium: 3.6 mmol/L (ref 3.5–5.1)
Sodium: 140 mmol/L (ref 135–145)
Total Bilirubin: 0.2 mg/dL — ABNORMAL LOW (ref 0.3–1.2)
Total Protein: 6.8 g/dL (ref 6.5–8.1)

## 2021-06-08 LAB — CBC WITH DIFFERENTIAL/PLATELET
Abs Immature Granulocytes: 0.05 10*3/uL (ref 0.00–0.07)
Basophils Absolute: 0 10*3/uL (ref 0.0–0.1)
Basophils Relative: 1 %
Eosinophils Absolute: 0.1 10*3/uL (ref 0.0–0.5)
Eosinophils Relative: 2 %
HCT: 27.9 % — ABNORMAL LOW (ref 36.0–46.0)
Hemoglobin: 8.8 g/dL — ABNORMAL LOW (ref 12.0–15.0)
Immature Granulocytes: 1 %
Lymphocytes Relative: 33 %
Lymphs Abs: 1.2 10*3/uL (ref 0.7–4.0)
MCH: 25.7 pg — ABNORMAL LOW (ref 26.0–34.0)
MCHC: 31.5 g/dL (ref 30.0–36.0)
MCV: 81.3 fL (ref 80.0–100.0)
Monocytes Absolute: 0.2 10*3/uL (ref 0.1–1.0)
Monocytes Relative: 5 %
Neutro Abs: 2.1 10*3/uL (ref 1.7–7.7)
Neutrophils Relative %: 58 %
Platelets: 374 10*3/uL (ref 150–400)
RBC: 3.43 MIL/uL — ABNORMAL LOW (ref 3.87–5.11)
RDW: 16.1 % — ABNORMAL HIGH (ref 11.5–15.5)
WBC: 3.6 10*3/uL — ABNORMAL LOW (ref 4.0–10.5)
nRBC: 0 % (ref 0.0–0.2)

## 2021-06-08 MED ORDER — COLD PACK MISC ONCOLOGY: 1.0000 | Freq: Once | Status: DC | PRN

## 2021-06-08 MED ORDER — HEPARIN SOD (PORK) LOCK FLUSH 100 UNIT/ML IV SOLN
500.0000 [IU] | Freq: Once | INTRAVENOUS | Status: AC | PRN
  Administered 2021-06-08: 12:00:00 500 [IU]

## 2021-06-08 MED ORDER — SODIUM CHLORIDE 0.9% FLUSH
10.0000 mL | Freq: Once | INTRAVENOUS | Status: AC
  Administered 2021-06-08: 08:00:00 10 mL

## 2021-06-08 MED ORDER — SODIUM CHLORIDE 0.9 % IV SOLN
80.0000 mg/m2 | Freq: Once | INTRAVENOUS | Status: AC
  Administered 2021-06-08: 11:00:00 180 mg via INTRAVENOUS
  Filled 2021-06-08: qty 30

## 2021-06-08 MED ORDER — DEXAMETHASONE SODIUM PHOSPHATE 10 MG/ML IJ SOLN
4.0000 mg | Freq: Once | INTRAMUSCULAR | Status: AC
  Administered 2021-06-08: 10:00:00 4 mg via INTRAVENOUS
  Filled 2021-06-08: qty 1

## 2021-06-08 MED ORDER — SODIUM CHLORIDE 0.9% FLUSH
10.0000 mL | INTRAVENOUS | Status: DC | PRN
  Administered 2021-06-08: 12:00:00 10 mL

## 2021-06-08 MED ORDER — DIPHENHYDRAMINE HCL 50 MG/ML IJ SOLN
25.0000 mg | Freq: Once | INTRAMUSCULAR | Status: AC
  Administered 2021-06-08: 10:00:00 25 mg via INTRAVENOUS
  Filled 2021-06-08: qty 1

## 2021-06-08 MED ORDER — FAMOTIDINE 20 MG IN NS 100 ML IVPB
20.0000 mg | Freq: Once | INTRAVENOUS | Status: AC
  Administered 2021-06-08: 10:00:00 20 mg via INTRAVENOUS
  Filled 2021-06-08: qty 100

## 2021-06-08 MED ORDER — SODIUM CHLORIDE 0.9 % IV SOLN
Freq: Once | INTRAVENOUS | Status: AC
Start: 2021-06-08 — End: 2021-06-08

## 2021-06-08 NOTE — Assessment & Plan Note (Signed)
Chelsea Hansen is a 50 year old woman who is here today to receive her #11/12 weekly paclitaxel neoadjuvantly for her estrogen receptor progesterone receptor positive breast cancer.  #1.  Bilateral breast cancer: She will proceed with chemotherapy today.  She continues to completely deny experiencing any peripheral neuropathy.  She is stopping at 11 cycles today.  Chelsea Hansen met with myself and Dr. Payton Mccallum today.  We reviewed her breast MRI.  She is happy with the results.  We discussed that she will receive her final cycle of Taxol today.  Ending 1 cycle early, as she is going to [place] for 2-1/2 weeks.  She will meet with Dr. Ninfa Linden following her return, and will proceed with surgery sometime in January 2023.  We reviewed with her that she can have her port removed at the time of surgery.  I sent a message to Dr. Ninfa Linden and our nurse navigators indicating this.  She will see Dr. Payton Mccallum 1 week after surgery.  We discussed her overall treatment plan which includes surgery, adjuvant radiation therapy, antiestrogen therapy, and Verzenio.  We let Chelsea Hansen know that we will review everything in further detail once she has completed her surgery and we have her pathological results.
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
## 2021-06-08 NOTE — Progress Notes (Addendum)
Chelsea Hansen Cancer Follow up:    Chelsea Perna, NP [place] [address]   DIAGNOSIS:  Cancer Staging  Malignant neoplasm of upper-outer quadrant of left breast in female, estrogen receptor positive ([place]) Staging form: Breast, AJCC 8th Edition - Clinical stage from 12/17/2020: cT1b, cN0, cM0, ER+, PR+, HER2- - Signed by [name], MD on 12/17/2020 Stage prefix: Initial diagnosis Laterality: Left Staged by: Pathologist and managing physician Stage used in treatment planning: Yes National guidelines used in treatment planning: Yes Type of national guideline used in treatment planning: NCCN  Malignant neoplasm of upper-outer quadrant of right breast in female, estrogen receptor positive ([place]) Staging form: Breast, AJCC 8th Edition - Clinical stage from 12/17/2020: cT2, cN1, cM0, ER+, PR+, HER2- - Signed by [name], MD on 12/17/2020 Stage prefix: Initial diagnosis Laterality: Right Staged by: Pathologist and managing physician Stage used in treatment planning: Yes National guidelines used in treatment planning: Yes Type of national guideline used in treatment planning: NCCN   SUMMARY OF ONCOLOGIC HISTORY: Oncology History  Malignant neoplasm of upper-outer quadrant of right breast in female, estrogen receptor positive ([place])  12/12/2020 Initial Diagnosis   status post bilateral breast biopsies 12/03/2020, showing             (1) on the right, a clinical T2 N1, stage IIa invasive lobular carcinoma, grade 2, estrogen and progesterone receptor strongly positive, HER2 not amplified, with an MIB-1 of 40%                         (a) the biopsied right axillary lymph node was positive with extracapsular extension                         (b) a second right breast mass also biopsied was a fibroadenoma, concordant  MAMMAPRINT tested on biopsy returned high risk, luminal type B, indicating significant benefit from chemo                          (c) biopsy of an area of non-mass-like enhancement in the upper right breast pending   12/17/2020 Cancer Staging   Staging form: Breast, AJCC 8th Edition - Clinical stage from 12/17/2020: cT2, cN1, cM0, ER+, PR+, HER2- - Signed by [name], MD on 12/17/2020 Stage prefix: Initial diagnosis Laterality: Right Staged by: Pathologist and managing physician Stage used in treatment planning: Yes National guidelines used in treatment planning: Yes Type of national guideline used in treatment planning: NCCN    12/24/2020 Genetic Testing   Negative genetic testing:  No pathogenic variants detected on the [employer] BRCAplus panel (report date 12/24/2020) or the CancerNext-Expanded + RNAinsight panel (report date 12/31/2020). 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   The BRCAplus panel offered by [employer] and includes sequencing and deletion/duplication analysis for the following 8 genes: ATM, BRCA1, BRCA2, CDH1, CHEK2, PALB2, PTEN, and TP53. The CancerNext-Expanded + RNAinsight gene panel offered by [employer] and includes sequencing and rearrangement analysis for the following 77 genes: AIP, ALK, APC, ATM, AXIN2, BAP1, BARD1, BLM, BMPR1A, BRCA1, BRCA2, BRIP1, CDC73, CDH1, CDK4, CDKN1B, CDKN2A, CHEK2, CTNNA1, DICER1, FANCC, FH, FLCN, GALNT12, KIF1B, LZTR1, MAX, MEN1, MET, MLH1, MSH2, MSH3, MSH6, MUTYH, NBN, NF1, NF2, NTHL1, PALB2, PHOX2B, PMS2, POT1, PRKAR1A, PTCH1, PTEN, RAD51C, RAD51D, RB1, RECQL, RET, SDHA, SDHAF2, SDHB, SDHC, SDHD,  SMAD4, SMARCA4, SMARCB1, SMARCE1, STK11, SUFU, TMEM127, TP53, TSC1, TSC2, VHL and XRCC2 (sequencing and deletion/duplication); EGFR, EGLN1, HOXB13, KIT, MITF, PDGFRA, POLD1 and POLE (sequencing only); EPCAM and GREM1 (deletion/duplication only). RNA data is routinely analyzed for use in variant interpretation for all genes.   01/27/2021 -  Neo-Adjuvant Chemotherapy   Neoadjuvant chemotherapy with Doxorubicin and  Cyclophosphamide given x 4 beginning 01/27/2021 and completing on 03/10/2021 followed by weekly paclitaxel x 12 beginning 03/24/2021   06/2021 Surgery   Surgery to follow neoadjuvant chemotherapy   07/2021 -  Radiation Therapy   Adjuvant radiation to follow surgery   09/2021 -  Anti-estrogen oral therapy   To follow radiation therapy   Malignant neoplasm of upper-outer quadrant of left breast in female, estrogen receptor positive ([place])  12/12/2020 Initial Diagnosis   on the left, a clinical T1b N0, stage IA invasive ductal carcinoma, grade 1 or 2, estrogen and progesterone receptor positive, HER2 not amplified, with an MIB 1 of 15%   12/17/2020 Cancer Staging   Staging form: Breast, AJCC 8th Edition - Clinical stage from 12/17/2020: cT1b, cN0, cM0, ER+, PR+, HER2- - Signed by [name], MD on 12/17/2020 Stage prefix: Initial diagnosis Laterality: Left Staged by: Pathologist and managing physician Stage used in treatment planning: Yes National guidelines used in treatment planning: Yes Type of national guideline used in treatment planning: NCCN    12/24/2020 Genetic Testing   Negative genetic testing:  No pathogenic variants detected on the [facility] BRCAplus panel (report date 12/24/2020) or the CancerNext-Expanded + RNAinsight panel (report date 12/31/2020). 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   The BRCAplus panel offered by [employer] and includes sequencing and deletion/duplication analysis for the following 8 genes: ATM, BRCA1, BRCA2, CDH1, CHEK2, PALB2, PTEN, and TP53. The CancerNext-Expanded + RNAinsight gene panel offered by [employer] and includes sequencing and rearrangement analysis for the following 77 genes: AIP, ALK, APC, ATM, AXIN2, BAP1, BARD1, BLM, BMPR1A, BRCA1, BRCA2, BRIP1, CDC73, CDH1, CDK4, CDKN1B, CDKN2A, CHEK2, CTNNA1, DICER1, FANCC, FH, FLCN, GALNT12, KIF1B, LZTR1, MAX, MEN1, MET, MLH1, MSH2, MSH3, MSH6,  MUTYH, NBN, NF1, NF2, NTHL1, PALB2, PHOX2B, PMS2, POT1, PRKAR1A, PTCH1, PTEN, RAD51C, RAD51D, RB1, RECQL, RET, SDHA, SDHAF2, SDHB, SDHC, SDHD, SMAD4, SMARCA4, SMARCB1, SMARCE1, STK11, SUFU, TMEM127, TP53, TSC1, TSC2, VHL and XRCC2 (sequencing and deletion/duplication); EGFR, EGLN1, HOXB13, KIT, MITF, PDGFRA, POLD1 and POLE (sequencing only); EPCAM and GREM1 (deletion/duplication only). RNA data is routinely analyzed for use in variant interpretation for all genes.   01/27/2021 -  Neo-Adjuvant Chemotherapy   Neoadjuvant chemotherapy with Doxorubicin and Cyclophosphamide given x 4 beginning 01/27/2021 and completing on 03/10/2021 followed by weekly paclitaxel x 12 beginning 03/24/2021   06/2021 Surgery   Surgery to follow neoadjuvant chemotherapy   07/2021 -  Radiation Therapy   Adjuvant radiation to follow surgery   09/2021 -  Anti-estrogen oral therapy   To follow radiation therapy     CURRENT THERAPY: weekly Taxol  INTERVAL HISTORY: Chelsea Hansen 50 y.o. female returns for evaluation prior to receiving her 11th cycle of weekly Taxol.  She continues to tolerate this well.  She denies peripheral neuropathy.  She underwent her bilateral breast MRI on June 05, 2021.  This showed a decrease in her right sided invasive lobular cancer from 3.9 cm to 2.3 cm.  Non-mass enhancement in the right breast had resolved.  In the left breast she had a complete radiographic response to treatment.  Her  previous right-sided abnormal lymph nodes significantly decreased in size, with no abnormal appearing lymph nodes noted on either side.    She met with Dr. Ninfa Linden this past Friday.  Her breast MRI had not yet resulted.  She is traveling to the [place] this evening.  She will be gone through the beginning of January.  She will meet with Dr. Ninfa Linden just prior to her surgery.  Patient Active Problem List   Diagnosis Date Noted   Pain management contract signed 05/04/2021   Chronic pain syndrome (breast cancer)  05/04/2021   Palpitations 03/25/2021   Sinus bradycardia 03/25/2021   Daytime somnolence 03/25/2021   Snoring 03/25/2021   Obesity (BMI 30-39.9) 03/25/2021   Port-A-Cath in place 01/27/2021   Genetic testing 12/24/2020   Family history of uterine cancer 12/17/2020   Malignant neoplasm of upper-outer quadrant of right breast in female, estrogen receptor positive ([place]) 12/12/2020   Malignant neoplasm of upper-outer quadrant of left breast in female, estrogen receptor positive ([place]) 12/12/2020   Hypokalemia 01/02/2012   Asthma 01/01/2012   Bradycardia 01/01/2012   Syncope 12/31/2011   Mediastinal adenopathy 12/31/2011   Anemia 12/31/2011   Chest pain 12/31/2011    has No Known Allergies.  MEDICAL HISTORY: Past Medical History:  Diagnosis Date   Allergy    seasonal allergies   Anxiety    on meds   Asthma    uses inhaler   Breast cancer ([place]) 2012   RIGHT lumpectomy   Cancer ([place]) 2022   RIGHT breast lump-dx 2022   Depression    on meds   DVT (deep venous thrombosis) ([place]) 2010   after hysterectomy   Family history of uterine cancer    GERD (gastroesophageal reflux disease)    with certain foods/OTC PRN meds   Headache(784.0)    Hyperlipidemia    on meds   Hypertension    on meds    SURGICAL HISTORY: Past Surgical History:  Procedure Laterality Date   ABDOMINAL HYSTERECTOMY     BREAST EXCISIONAL BIOPSY Right 09/16/2009   BREAST SURGERY     lumpectomy   PORTACATH PLACEMENT Left 01/26/2021   Procedure: INSERTION PORT-A-CATH;  Surgeon: [name], MD;  Location: [facility];  Service: General;  Laterality: Left;   TUBAL LIGATION      SOCIAL HISTORY: Social History   Socioeconomic History   Marital status: Single    Spouse name: Not on file   Number of children: Not on file   Years of education: Not on file   Highest education level: Not on file  Occupational History   Not on file  Tobacco Use   Smoking status: Never   Smokeless tobacco: Never  Vaping  Use   Vaping Use: Never used  Substance and Sexual Activity   Alcohol use: Not Currently    Alcohol/week: 7.0 standard drinks    Types: 7 Standard drinks or equivalent per week    Comment: occassional   Drug use: No   Sexual activity: Not Currently  Other Topics Concern   Not on file  Social History Narrative   Not on file   Social Determinants of Health   Financial Resource Strain: High Risk   Difficulty of Paying Living Expenses: Hard  Food Insecurity: Food Insecurity Present   Worried About Running Out of Food in the Last Year: Sometimes true   Ran Out of Food in the Last Year: Sometimes true  Transportation Needs: No Transportation Needs   Lack of Transportation (Medical): No  Lack of Transportation (Non-Medical): No  Physical Activity: Not on file  Stress: Not on file  Social Connections: Not on file  Intimate Partner Violence: Not on file    FAMILY HISTORY: Family History  Problem Relation Age of Onset   Cancer Mother        ?, dx <50   Hypertension Father    Cancer Maternal Aunt        uterine vs ovarian, dx <50   Cancer Maternal Aunt        unknown type, dx <50   Uterine cancer Cousin 83   Colon polyps Neg Hx    Colon cancer Neg Hx    Esophageal cancer Neg Hx    Stomach cancer Neg Hx     Review of Systems  Constitutional:  Positive for fatigue (Mild). Negative for appetite change, chills, fever and unexpected weight change.  HENT:   Negative for hearing loss, lump/mass and trouble swallowing.   Eyes:  Negative for eye problems and icterus.  Respiratory:  Negative for chest tightness, cough and shortness of breath.   Cardiovascular:  Negative for chest pain, leg swelling and palpitations.  Gastrointestinal:  Negative for abdominal distention, abdominal pain, constipation, diarrhea, nausea and vomiting.  Endocrine: Negative for hot flashes.  Genitourinary:  Negative for difficulty urinating.   Musculoskeletal:  Negative for arthralgias.  Skin:  Negative  for itching and rash.  Neurological:  Negative for dizziness, extremity weakness, headaches and numbness.  Hematological:  Negative for adenopathy. Does not bruise/bleed easily.  Psychiatric/Behavioral:  Negative for depression. The patient is not nervous/anxious.      PHYSICAL EXAMINATION  ECOG PERFORMANCE STATUS: 1 - Symptomatic but completely ambulatory  Vitals:   06/08/21 0835  BP: (!) 170/89  Pulse: 79  Resp: 17  Temp: (!) 96.8 F (36 C)  SpO2: 100%    Physical Exam Constitutional:      General: She is not in acute distress.    Appearance: Normal appearance. She is not toxic-appearing.  HENT:     Head: Normocephalic and atraumatic.  Eyes:     General: No scleral icterus. Cardiovascular:     Rate and Rhythm: Normal rate and regular rhythm.     Pulses: Normal pulses.     Heart sounds: Normal heart sounds.  Pulmonary:     Effort: Pulmonary effort is normal.     Breath sounds: Normal breath sounds.  Abdominal:     General: Abdomen is flat. Bowel sounds are normal. There is no distension.     Palpations: Abdomen is soft.     Tenderness: There is no abdominal tenderness.  Musculoskeletal:        General: No swelling.     Cervical back: Neck supple.  Lymphadenopathy:     Cervical: No cervical adenopathy.  Skin:    General: Skin is warm and dry.     Findings: No rash.  Neurological:     General: No focal deficit present.     Mental Status: She is alert.  Psychiatric:        Mood and Affect: Mood normal.        Behavior: Behavior normal.    LABORATORY DATA:  CBC    Component Value Date/Time   WBC 3.6 (L) 06/08/2021 0817   RBC 3.43 (L) 06/08/2021 0817   HGB 8.8 (L) 06/08/2021 0817   HGB 8.3 (L) 03/02/2021 1533   HGB 11.5 10/06/2020 1432   HCT 27.9 (L) 06/08/2021 0817   HCT 36.8 10/06/2020 1432  PLT 374 06/08/2021 0817   PLT 268 03/02/2021 1533   PLT 323 10/06/2020 1432   MCV 81.3 06/08/2021 0817   MCV 73 (L) 10/06/2020 1432   MCH 25.7 (L) 06/08/2021  0817   MCHC 31.5 06/08/2021 0817   RDW 16.1 (H) 06/08/2021 0817   RDW 15.9 (H) 10/06/2020 1432   LYMPHSABS 1.2 06/08/2021 0817   LYMPHSABS 1.9 10/06/2020 1432   MONOABS 0.2 06/08/2021 0817   EOSABS 0.1 06/08/2021 0817   EOSABS 0.1 10/06/2020 1432   BASOSABS 0.0 06/08/2021 0817   BASOSABS 0.0 10/06/2020 1432    CMP     Component Value Date/Time   NA 140 06/08/2021 0817   NA 139 10/06/2020 1432   K 3.6 06/08/2021 0817   CL 107 06/08/2021 0817   CO2 24 06/08/2021 0817   GLUCOSE 124 (H) 06/08/2021 0817   BUN 13 06/08/2021 0817   BUN 11 10/06/2020 1432   CREATININE 0.75 06/08/2021 0817   CREATININE 0.76 05/19/2021 1015   CALCIUM 8.9 06/08/2021 0817   PROT 6.8 06/08/2021 0817   PROT 6.9 10/06/2020 1432   ALBUMIN 3.6 06/08/2021 0817   ALBUMIN 4.1 10/06/2020 1432   AST 16 06/08/2021 0817   AST 22 05/19/2021 1015   ALT 20 06/08/2021 0817   ALT 21 05/19/2021 1015   ALKPHOS 84 06/08/2021 0817   BILITOT 0.2 (L) 06/08/2021 0817   BILITOT 0.5 05/19/2021 1015   GFRNONAA >60 06/08/2021 0817   GFRNONAA >60 05/19/2021 1015   GFRAA >60 09/07/2019 1340      ASSESSMENT and THERAPY PLAN:   Malignant neoplasm of upper-outer quadrant of left breast in female, estrogen receptor positive ([place]) Deferred is a 50 year old woman who is here today to receive her #11/12 weekly paclitaxel neoadjuvantly for her estrogen receptor progesterone receptor positive breast cancer.  #1.  Bilateral breast cancer: She will proceed with chemotherapy today.  She continues to completely deny experiencing any peripheral neuropathy.  She is stopping at 11 cycles today.  Lanaya met with myself and Dr. Payton Mccallum today.  We reviewed her breast MRI.  She is happy with the results.  We discussed that she will receive her final cycle of Taxol today.  Ending 1 cycle early, as she is going to [place] for 2-1/2 weeks.  She will meet with Dr. Ninfa Linden following her return, and will proceed with surgery sometime in January  2023.  We reviewed with her that she can have her port removed at the time of surgery.  I sent a message to Dr. Ninfa Linden and our nurse navigators indicating this.  She will see Dr. Payton Mccallum 1 week after surgery.  We discussed her overall treatment plan which includes surgery, adjuvant radiation therapy, antiestrogen therapy, and Verzenio.  We let Ravleen know that we will review everything in further detail once she has completed her surgery and we have her pathological results.   All questions were answered. The patient knows to call the clinic with any problems, questions or concerns. We can certainly see the patient much sooner if necessary.  Total encounter time: 30 minutes in face-to-face visit time, chart review, lab review, care coordination, scheduling, documentation of encounter.  Wilber Bihari, NP 06/08/21 9:31 AM Medical Oncology and Hematology [facility] [place], [place] [address] Tel. [phone]    Fax. [phone]  *Total Encounter Time as defined by the Centers for Medicare and Medicaid Services includes, in addition to the face-to-face time of a patient visit (documented in the  note above) non-face-to-face time: obtaining and reviewing outside history, ordering and reviewing medications, tests or procedures, care coordination (communications with other health care professionals or caregivers) and documentation in the medical record.   Attending Note  I personally saw and examined Chelsea Hansen. The plan of care was discussed with her. I agree with the physical exam findings and assessment and plan as documented above. Bilateral breast cancer status post neoadjuvant chemotherapy with dose dense Adriamycin and Cytoxan and today is cycle 11 of Taxol.  This will be her last cycle of chemotherapy. Breast MRI revealed significant response to the tumors in both sides.  Lymph nodes appear to have normalized. She has appointments to see Dr. Ninfa Linden for  bilateral lumpectomies and targeted node dissection. I would like to see her back after surgery to discuss final pathology report. Her port can be removed during surgery. Patient is going to [place] for 2 weeks and to celebrate completion of chemotherapy.   Signed Harriette Ohara, MD

## 2021-06-08 NOTE — Patient Instructions (Signed)
[facility]  Discharge Instructions: Thank you for choosing [facility] to provide your oncology and hematology care.   If you have a lab appointment with the [facility], please go directly to the [facility] and check in at the registration area.   Wear comfortable clothing and clothing appropriate for easy access to any Portacath or PICC line.   We strive to give you quality time with your provider. You may need to reschedule your appointment if you arrive late (15 or more minutes).  Arriving late affects you and other patients whose appointments are after yours.  Also, if you miss three or more appointments without notifying the office, you may be dismissed from the clinic at the providers discretion.      For prescription refill requests, have your pharmacy contact our office and allow 72 hours for refills to be completed.    Today you received the following chemotherapy and/or immunotherapy agents: Paclitaxel.      To help prevent nausea and vomiting after your treatment, we encourage you to take your nausea medication as directed.  BELOW ARE SYMPTOMS THAT SHOULD BE REPORTED IMMEDIATELY: *FEVER GREATER THAN 100.4 F (38 C) OR HIGHER *CHILLS OR SWEATING *NAUSEA AND VOMITING THAT IS NOT CONTROLLED WITH YOUR NAUSEA MEDICATION *UNUSUAL SHORTNESS OF BREATH *UNUSUAL BRUISING OR BLEEDING *URINARY PROBLEMS (pain or burning when urinating, or frequent urination) *BOWEL PROBLEMS (unusual diarrhea, constipation, pain near the anus) TENDERNESS IN MOUTH AND THROAT WITH OR WITHOUT PRESENCE OF ULCERS (sore throat, sores in mouth, or a toothache) UNUSUAL RASH, SWELLING OR PAIN  UNUSUAL VAGINAL DISCHARGE OR ITCHING   Items with * indicate a potential emergency and should be followed up as soon as possible or go to the Emergency Department if any problems should occur.  Please show the CHEMOTHERAPY ALERT CARD or IMMUNOTHERAPY ALERT CARD at check-in  to the Emergency Department and triage nurse.  Should you have questions after your visit or need to cancel or reschedule your appointment, please contact [facility]  Dept: [phone]  and follow the prompts.  Office hours are 8:00 a.m. to 4:30 p.m. Monday - Friday. Please note that voicemails left after 4:00 p.m. may not be returned until the following business day.  We are closed weekends and major holidays. You have access to a nurse at all times for urgent questions. Please call the main number to the clinic Dept: [phone] and follow the prompts.   For any non-urgent questions, you may also contact your provider using MyChart. We now offer e-Visits for anyone 57 and older to request care online for non-urgent symptoms. For details visit [email or web address].   Also download the MyChart app! Go to the app store, search "MyChart", open the app, select , and log in with your MyChart username and password.  Due to Covid, a mask is required upon entering the hospital/clinic. If you do not have a mask, one will be given to you upon arrival. For doctor visits, patients may have 1 support person aged 70 or older with them. For treatment visits, patients cannot have anyone with them due to current Covid guidelines and our immunocompromised population.   Paclitaxel injection What is this medication? PACLITAXEL (PAK li TAX el) is a chemotherapy drug. It targets fast dividing cells, like cancer cells, and causes these cells to die. This medicine is used to treat ovarian cancer, breast cancer, lung cancer, Kaposi's sarcoma, and other cancers. This medicine may  be used for other purposes; ask your health care provider or pharmacist if you have questions. COMMON BRAND NAME(S): Onxol, Taxol What should I tell my care team before I take this medication? They need to know if you have any of these conditions: history of irregular heartbeat liver disease low  blood counts, like low white cell, platelet, or red cell counts lung or breathing disease, like asthma tingling of the fingers or toes, or other nerve disorder an unusual or allergic reaction to paclitaxel, alcohol, polyoxyethylated castor oil, other chemotherapy, other medicines, foods, dyes, or preservatives pregnant or trying to get pregnant breast-feeding How should I use this medication? This drug is given as an infusion into a vein. It is administered in a hospital or clinic by a specially trained health care professional. Talk to your pediatrician regarding the use of this medicine in children. Special care may be needed. Overdosage: If you think you have taken too much of this medicine contact a poison control center or emergency room at once. NOTE: This medicine is only for you. Do not share this medicine with others. What if I miss a dose? It is important not to miss your dose. Call your doctor or health care professional if you are unable to keep an appointment. What may interact with this medication? Do not take this medicine with any of the following medications: live virus vaccines This medicine may also interact with the following medications: antiviral medicines for hepatitis, HIV or AIDS certain antibiotics like erythromycin and clarithromycin certain medicines for fungal infections like ketoconazole and itraconazole certain medicines for seizures like carbamazepine, phenobarbital, phenytoin gemfibrozil nefazodone rifampin St. John's wort This list may not describe all possible interactions. Give your health care provider a list of all the medicines, herbs, non-prescription drugs, or dietary supplements you use. Also tell them if you smoke, drink alcohol, or use illegal drugs. Some items may interact with your medicine. What should I watch for while using this medication? Your condition will be monitored carefully while you are receiving this medicine. You will need  important blood work done while you are taking this medicine. This medicine can cause serious allergic reactions. To reduce your risk you will need to take other medicine(s) before treatment with this medicine. If you experience allergic reactions like skin rash, itching or hives, swelling of the face, lips, or tongue, tell your doctor or health care professional right away. In some cases, you may be given additional medicines to help with side effects. Follow all directions for their use. This drug may make you feel generally unwell. This is not uncommon, as chemotherapy can affect healthy cells as well as cancer cells. Report any side effects. Continue your course of treatment even though you feel ill unless your doctor tells you to stop. Call your doctor or health care professional for advice if you get a fever, chills or sore throat, or other symptoms of a cold or flu. Do not treat yourself. This drug decreases your body's ability to fight infections. Try to avoid being around people who are sick. This medicine may increase your risk to bruise or bleed. Call your doctor or health care professional if you notice any unusual bleeding. Be careful brushing and flossing your teeth or using a toothpick because you may get an infection or bleed more easily. If you have any dental work done, tell your dentist you are receiving this medicine. Avoid taking products that contain aspirin, acetaminophen, ibuprofen, naproxen, or ketoprofen unless instructed by your  doctor. These medicines may hide a fever. Do not become pregnant while taking this medicine. Women should inform their doctor if they wish to become pregnant or think they might be pregnant. There is a potential for serious side effects to an unborn child. Talk to your health care professional or pharmacist for more information. Do not breast-feed an infant while taking this medicine. Men are advised not to father a child while receiving this  medicine. This product may contain alcohol. Ask your pharmacist or healthcare provider if this medicine contains alcohol. Be sure to tell all healthcare providers you are taking this medicine. Certain medicines, like metronidazole and disulfiram, can cause an unpleasant reaction when taken with alcohol. The reaction includes flushing, headache, nausea, vomiting, sweating, and increased thirst. The reaction can last from 30 minutes to several hours. What side effects may I notice from receiving this medication? Side effects that you should report to your doctor or health care professional as soon as possible: allergic reactions like skin rash, itching or hives, swelling of the face, lips, or tongue breathing problems changes in vision fast, irregular heartbeat high or low blood pressure mouth sores pain, tingling, numbness in the hands or feet signs of decreased platelets or bleeding - bruising, pinpoint red spots on the skin, black, tarry stools, blood in the urine signs of decreased red blood cells - unusually weak or tired, feeling faint or lightheaded, falls signs of infection - fever or chills, cough, sore throat, pain or difficulty passing urine signs and symptoms of liver injury like dark yellow or brown urine; general ill feeling or flu-like symptoms; light-colored stools; loss of appetite; nausea; right upper belly pain; unusually weak or tired; yellowing of the eyes or skin swelling of the ankles, feet, hands unusually slow heartbeat Side effects that usually do not require medical attention (report to your doctor or health care professional if they continue or are bothersome): diarrhea hair loss loss of appetite muscle or joint pain nausea, vomiting pain, redness, or irritation at site where injected tiredness This list may not describe all possible side effects. Call your doctor for medical advice about side effects. You may report side effects to FDA at 1-800-FDA-1088. Where  should I keep my medication? This drug is given in a hospital or clinic and will not be stored at home. NOTE: This sheet is a summary. It may not cover all possible information. If you have questions about this medicine, talk to your doctor, pharmacist, or health care provider.  2022 Elsevier/Gold Standard (2021-02-24 00:00:00)

## 2021-06-09 ENCOUNTER — Encounter: Payer: Self-pay | Admitting: *Deleted

## 2021-06-10 ENCOUNTER — Telehealth: Payer: Self-pay | Admitting: Hematology and Oncology

## 2021-06-10 NOTE — Telephone Encounter (Signed)
Scheduled per sch msg. Called and left msg  

## 2021-06-29 ENCOUNTER — Ambulatory Visit (INDEPENDENT_AMBULATORY_CARE_PROVIDER_SITE_OTHER): Payer: Self-pay | Admitting: *Deleted

## 2021-06-29 ENCOUNTER — Encounter (HOSPITAL_BASED_OUTPATIENT_CLINIC_OR_DEPARTMENT_OTHER): Payer: Self-pay | Admitting: Surgery

## 2021-06-29 ENCOUNTER — Other Ambulatory Visit: Payer: Self-pay

## 2021-06-29 ENCOUNTER — Telehealth (INDEPENDENT_AMBULATORY_CARE_PROVIDER_SITE_OTHER): Payer: Self-pay

## 2021-06-29 NOTE — Telephone Encounter (Signed)
Reason for Disposition  Quick-relief asthma medicine (e.g., albuterol /salbutamol, levalbuterol by inhaler or nebulizer) is needed more frequently than every 4 hours to keep you comfortable  Answer Assessment - Initial Assessment Questions 1. RESPIRATORY STATUS: "Describe your breathing?" (e.g., wheezing, shortness of breath, unable to speak, severe coughing)      Sore throat, cough 2. ONSET: "When did this asthma attack begin?"      Last Monday 3. TRIGGER: "What do you think triggered this attack?" (e.g., URI, exposure to pollen or other allergen, tobacco smoke)      Travel out of country- sometimes has trouble with flying 4. PEAK EXPIRATORY FLOW RATE (PEFR): "Do you use a peak flow meter?" If Yes, ask: "What's the current peak flow? What's your personal best peak flow?"      na 5. SEVERITY: "How bad is this attack?"    - MILD: No SOB at rest, mild SOB with walking, speaks normally in sentences, can lie down, no retractions, pulse < 100. (GREEN Zone: PEFR 80-100%)   - MODERATE: SOB at rest, SOB with minimal exertion and prefers to sit, cannot lie down flat, speaks in phrases, mild retractions, audible wheezing, pulse 100-120. (YELLOW Zone: PEFR 50-79%)    - SEVERE: Struggling for each breath, speaks in single words, struggling to breathe, sitting hunched forward, retractions, usually loud wheezing, sometimes minimal wheezing because of decreased air movement, pulse > 120. (RED Zone: PEFR < 50%).      moderate 6. ASTHMA MEDICINES:  "What treatments have you given?"    - INHALED QUICK RELIEF (RESCUE): "What is your inhaled quick-relief medicine?" (e.g., albuterol, salbutamol) "Do you use an inhaler or a nebulizer?" "How frequently have you been using?"   - CONTROLLER (LONG-TERM-CONTROL): "Do you take an inhaled steroid? (e.g., Asmanex, Flovent, Pulmicort, Qvar)     Albuterol- using all 4 inhalers 7. INHALED QUICK-RELIEF TREATMENTS FOR THIS ATTACK: "What treatments have you given yourself so far?"  and "How many and how often?" If using an inhaler, ask, "How many puffs?" Note: Routine treatments are 2 puffs every 4 hours as needed. Rescue treatments are 4 puffs repeated every 20 minutes, up to three times as needed.      Helping a little- using more often 8. OTHER SYMPTOMS: "Do you have any other symptoms? (e.g., chest pain, coughing up yellow sputum, fever, runny nose)     Coughing- green, sore throat 9. O2 SATURATION MONITOR:  "Do you use an oxygen saturation monitor (pulse oximeter) at home?" If Yes, "What is your reading (oxygen level) today?" "What is your usual oxygen saturation reading?" (e.g., 95%)     *No Answer* 10. PREGNANCY: "Is there any chance you are pregnant?" "When was your last menstrual period?"       *No Answer*  Protocols used: Asthma Attack-A-AH

## 2021-06-29 NOTE — Telephone Encounter (Signed)
Copied from [facility] [id number]. Topic: Appointment Scheduling - Scheduling Inquiry for Clinic >> Jun 29, 2021  8:23 AM Alanda Slim E wrote: Reason for CRM: Pt will be on a train coming back to [place] on Thursday and asked if her appt with [facility] can be done virtually or on facetime/ please advise

## 2021-06-29 NOTE — Telephone Encounter (Signed)
°  Chief Complaint: asthma exacerbation  Symptoms: cough, sore throat, inhaler use more frequent Frequency: 1 week Pertinent Negatives: Patient denies chest pain,fever Disposition: [] ED /[x] Urgent Care (no appt availability in office) / [] Appointment(In office/virtual)/ []  [facility]/ [] Home Care/ [] Refused Recommended Disposition /[] [facility]/ []  Follow-up with PCP Additional Notes: Patient is in [place]- advised UC/ED for evaluation

## 2021-06-29 NOTE — Progress Notes (Signed)
Patient states she just returned from [place] and is experiencing SOB, coughing, and green sputum at this time. Has not been tested for COVID. Patient is to see cardiologist for follow up on 07/10/21. Also, most recent CMP is 06/08/21. Chart reviewed by Dr. Sabra Heck, anesthesiologist. Continue with plan for surgery and re-assess respiratory symptoms DOS per Dr. Sabra Heck. Also, no need for new BMP prior to sx at this time.

## 2021-06-30 ENCOUNTER — Other Ambulatory Visit: Payer: Self-pay | Admitting: Surgery

## 2021-06-30 DIAGNOSIS — C50911 Malignant neoplasm of unspecified site of right female breast: Secondary | ICD-10-CM

## 2021-06-30 DIAGNOSIS — C50912 Malignant neoplasm of unspecified site of left female breast: Secondary | ICD-10-CM

## 2021-07-02 ENCOUNTER — Other Ambulatory Visit: Payer: Self-pay

## 2021-07-02 ENCOUNTER — Ambulatory Visit (INDEPENDENT_AMBULATORY_CARE_PROVIDER_SITE_OTHER): Payer: Medicaid Other | Admitting: Clinical

## 2021-07-02 DIAGNOSIS — F333 Major depressive disorder, recurrent, severe with psychotic symptoms: Secondary | ICD-10-CM

## 2021-07-03 ENCOUNTER — Telehealth: Payer: Self-pay | Admitting: Cardiology

## 2021-07-03 NOTE — Telephone Encounter (Signed)
Chelsea Hansen:    Patient says she is having breast cancer surgery on 07-09-21,. She want d to know does she need to keep her appointment with Dr Harriet Masson on 07-10-21?

## 2021-07-03 NOTE — Telephone Encounter (Signed)
Spoke to the patient. She is having breast surgery on 1/19 and has a follow up with Dr. Harriet Masson on 1/20. She was calling to make sure she was okay cardiac wise to go through with the surgery and when she should follow up with Dr. Harriet Masson.  She stated that she has been doing fine. She is still having palpitations occasionally.

## 2021-07-03 NOTE — Telephone Encounter (Signed)
New Message:    Patient would like to schedule her Sleep Study please.

## 2021-07-03 NOTE — Telephone Encounter (Signed)
We can bring her in for a video visit on 07/08/2021 - anytime works for me.

## 2021-07-06 NOTE — BH Specialist Note (Signed)
Integrated Behavioral Health via Telemedicine Visit  07/02/2021 KERRI KOVACIK [id number]  Number of [facility] visits: 5 Session Start time: 9:00am  Session End time: 9:30am Total time: 30  Referring Provider: Juluis Mire, NP Patient/Family location: [facility] Provider location: [facility] Office  All persons participating in visit: Pt and LCSWA Types of Service: Individual psychotherapy  I connected with Leighton Parody via Video Enabled Telemedicine Application  (Video is Caregility application) and verified that I am speaking with the correct person using two identifiers. Discussed confidentiality: Yes   I discussed the limitations of telemedicine and the availability of in person appointments.  Discussed there is a possibility of technology failure and discussed alternative modes of communication if that failure occurs.  I discussed that engaging in this telemedicine visit, they consent to the provision of behavioral healthcare and the services will be billed under their insurance.  Patient and/or legal guardian expressed understanding and consented to Telemedicine visit: Yes   Presenting Concerns: Patient and/or family reports the following symptoms/concerns: Reports feeling anxious, depressed, irritable, worrying, and restlessness. Reports that her grandmother is in the hospital in [facility]. Reports that she was headed back to [place] from [place] when she learned that her grandmother was sick. Report being frustrated due to worrying that her family has not properly cared for her grandmother. Reports that she is considering moving to [facility] to care for her grandmother.  Duration of problem: 1+ year; Severity of problem: severe  Patient and/or Family's Strengths/Protective Factors: Concrete supports in place (healthy food, safe environments, etc.)  Goals Addressed: Patient will:  Reduce symptoms of: anxiety and depression   Increase knowledge and/or ability of: coping  skills   Demonstrate ability to: Increase healthy adjustment to current life circumstances  Progress towards Goals: Ongoing  Interventions: Interventions utilized:  Mindfulness or Psychologist, educational and CBT Cognitive Behavioral Therapy Standardized Assessments completed: Not Needed  Patient and/or Family Response: Pt receptive to tx. Pt receptive to assistance with cognitive processing and cognitive restructuring. Pt receptive to support provided as she disclosed her worries. Pt receptive to deep breathing and grounding techniques to decrease her irritability around family.   Assessment: Denies SI/HI. Pt has hx of auditory hallucinations. Patient currently experiencing depression and anxiety. Pt is worried about her grandmother. Pt appears frustrated with her family. Pt is experiencing significant stress in addition to her own physical health.  Patient may benefit from brief interventions. LCSWA utilized cognitive restructuring and assisted with cognitive processing. LCSWA encouraged pt to utilize deep breathing exercises and grounding techniques. LCSWA scheduled psychiatry appt for pt with [facility] on 07/17/21 at 10:00am. LCSWA will fu with pt.  Plan: Follow up with behavioral health clinician on : 07/16/21 Behavioral recommendations: Utilize deep breathing exercises and grounding techniques. Referral(s): [facility] (In Clinic)  I discussed the assessment and treatment plan with the patient and/or parent/guardian. They were provided an opportunity to ask questions and all were answered. They agreed with the plan and demonstrated an understanding of the instructions.   They were advised to call back or seek an in-person evaluation if the symptoms worsen or if the condition fails to improve as anticipated.  Ferol Laiche C Khaidyn Staebell, LCSW

## 2021-07-06 NOTE — Telephone Encounter (Signed)
Appointment moved to 1/18. Patient made aware.

## 2021-07-08 ENCOUNTER — Ambulatory Visit
Admission: RE | Admit: 2021-07-08 | Discharge: 2021-07-08 | Disposition: A | Discharge status: Home / Self Care | Payer: Medicaid Other | Source: Ambulatory Visit | Attending: Surgery | Admitting: Surgery

## 2021-07-08 ENCOUNTER — Encounter: Payer: Self-pay | Admitting: Cardiology

## 2021-07-08 ENCOUNTER — Other Ambulatory Visit: Payer: Self-pay

## 2021-07-08 ENCOUNTER — Other Ambulatory Visit: Payer: Self-pay | Admitting: Surgery

## 2021-07-08 ENCOUNTER — Other Ambulatory Visit (HOSPITAL_COMMUNITY): Payer: Self-pay

## 2021-07-08 ENCOUNTER — Telehealth (INDEPENDENT_AMBULATORY_CARE_PROVIDER_SITE_OTHER): Payer: Medicaid Other | Admitting: Cardiology

## 2021-07-08 ENCOUNTER — Encounter: Payer: Self-pay | Admitting: Oncology

## 2021-07-08 VITALS — BP 139/80 | HR 79 | Ht 71.0 in | Wt 250.0 lb

## 2021-07-08 DIAGNOSIS — I471 Supraventricular tachycardia, unspecified: Secondary | ICD-10-CM | POA: Insufficient documentation

## 2021-07-08 DIAGNOSIS — C50911 Malignant neoplasm of unspecified site of right female breast: Secondary | ICD-10-CM

## 2021-07-08 DIAGNOSIS — C50912 Malignant neoplasm of unspecified site of left female breast: Secondary | ICD-10-CM

## 2021-07-08 NOTE — Patient Instructions (Signed)
Medication Instructions:  Your physician recommends that you continue on your current medications as directed. Please refer to the Current Medication list given to you today.  *If you need a refill on your cardiac medications before your next appointment, please call your pharmacy*   Follow-Up: At [facility], you and your health needs are our priority.  As part of our continuing mission to provide you with exceptional heart care, we have created designated Provider Care Teams.  These Care Teams include your primary Cardiologist (physician) and Advanced Practice Providers (APPs -  Physician Assistants and Nurse Practitioners) who all work together to provide you with the care you need, when you need it.  We recommend signing up for the patient portal called "MyChart".  Sign up information is provided on this After Visit Summary.  MyChart is used to connect with patients for Virtual Visits (Telemedicine).  Patients are able to view lab/test results, encounter notes, upcoming appointments, etc.  Non-urgent messages can be sent to your provider as well.   To learn more about what you can do with MyChart, go to [email or web address].    Your next appointment:   6 month(s)  The format for your next appointment:   In Person  Provider:   [name], DO

## 2021-07-08 NOTE — H&P (Signed)
PROVIDER: Beverlee Nims, MD  MRN: [id number] DOB: December 03, 1970  Subjective  Plan  Chief Complaint: No chief complaint on file.   History of Present Illness: Chelsea Hansen is a 51 y.o. female who is seen  for for a follow-up regarding her bilateral breast cancers. She is just about to complete her course of chemotherapy. Clinically she has had significant improvement. She will be undergoing an MRI  to evaluate both breasts and the response to the chemotherapy..  Again, her original right breast cancer was almost 4 cm in size. 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 The left breast cancer was approximate 1 cm    Review of Systems: A complete review of systems was obtained from the patient. I have reviewed this information and discussed as appropriate with the patient. See HPI as well for other ROS.  ROS   Medical History: Past Medical History:  Diagnosis Date   Anxiety   Asthma, unspecified asthma severity, unspecified whether complicated, unspecified whether persistent   History of cancer   There is no problem list on file for this patient.  Past Surgical History:  Procedure Laterality Date   HYSTERECTOMY    No Known Allergies  Current Outpatient Medications on File Prior to Visit  Medication Sig Dispense Refill   albuterol 90 mcg/actuation inhaler TAKE 2 PUFFS BY MOUTH EVERY 6 HOURS AS NEEDED FOR WHEEZE OR SHORTNESS OF BREATH   amLODIPine (NORVASC) 10 MG tablet Take 1 tablet by mouth once daily   atorvastatin (LIPITOR) 40 MG tablet Take 40 mg by mouth once daily   benzonatate (TESSALON) 200 MG capsule TAKE 1 CAPSULE BY MOUTH 3 TIMES A DAY FOR 7 DAYS   doxycycline (VIBRA-TABS) 100 MG tablet Take 1 tablet by mouth 2 (two) times daily   escitalopram oxalate (LEXAPRO) 20 MG tablet   fluticasone propion-salmeteroL (ADVAIR HFA) 115-21 mcg/actuation inhaler Inhale into the  lungs   fluticasone propionate (FLONASE) 50 mcg/actuation nasal spray Place into one nostril   hydroCHLOROthiazide (HYDRODIURIL) 25 MG tablet Take 1 tablet by mouth once daily   loratadine (CLARITIN) 10 mg tablet Take 1 tablet by mouth once daily   montelukast (SINGULAIR) 10 mg tablet Take by mouth   sulfamethoxazole-trimethoprim (BACTRIM DS) 800-160 mg tablet Take 1 tablet by mouth 2 (two) times daily   No current facility-administered medications on file prior to visit.   Family History  Problem Relation Age of Onset   Breast cancer Mother    Social History   Tobacco Use  Smoking Status Every Day   Types: Cigarettes  Smokeless Tobacco Never    Social History   Socioeconomic History   Marital status: Unknown  Tobacco Use   Smoking status: Every Day  Types: Cigarettes   Smokeless tobacco: Never  Substance and Sexual Activity   Alcohol use: Never   Drug use: Never   Objective:   There were no vitals filed for this visit.  There is no height or weight on file to calculate BMI.  Physical Exam   She has had significant regression in the size of the mass in the right breast. I cannot palpate any left breast masses. 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  The left breast cancer is 23mm.  Chemotherapy has been completed Plan will now be to proceed with bilateral radioactive seed guided lumpectomy, a right targeted lymph node dissection, a left sentinel node biopsy, and removal of her port-a-cath. We discussed the risks which include but are not limited to bleeding, infection, injury to surrounding structures, the need for other procedures if  margins are positive, etc.  She agrees to proceed.

## 2021-07-08 NOTE — Progress Notes (Signed)
Virtual Visit via Video Note   This visit type was conducted due to national recommendations for restrictions regarding the COVID-19 Pandemic (e.g. social distancing) in an effort to limit this patient's exposure and mitigate transmission in our community.  Due to her co-morbid illnesses, this patient is at least at moderate risk for complications without adequate follow up.  This format is felt to be most appropriate for this patient at this time.  All issues noted in this document were discussed and addressed.  A limited physical exam was performed with this format.  Please refer to the patient's chart for her consent to telehealth for [facility].      Date:  07/08/2021   ID:  Chelsea Hansen, DOB 1970/08/23, MRN [id number]  Patient Location: Home Provider Location: Office/Clinic  PCP:  [name], NP  Cardiologist:  [name], DO  Electrophysiologist:  None   Evaluation Performed:  Follow-Up Visit  Chief Complaint:  "I am still experiencing palpitations"  History of Present Illness:    Chelsea Hansen is a 51 y.o. female with history of bilateral breast cancer estrogen receptor positive status post right breast lumpectomy, DVT, GERD, hyperlipidemia and recently diagnosed Paroxymal SVT after wearing the zio monitor.  She was started on propanolol 10 mg twice a day.  She still is experiencing some palpitations.  Chest pain, no shortness of breath.  The patient does not have symptoms concerning for COVID-19 infection (fever, chills, cough, or new shortness of breath).    Past Medical History:  Diagnosis Date   Allergy    seasonal allergies   Anxiety    on meds   Asthma    uses inhaler   Breast cancer ([place]) 2012   RIGHT lumpectomy   Cancer ([place]) 2022   RIGHT breast lump-dx 2022   Depression    on meds   DVT (deep venous thrombosis) ([place]) 2010   after hysterectomy   Family history of uterine cancer    GERD (gastroesophageal reflux disease)    with certain  foods/OTC PRN meds   Headache(784.0)    Hyperlipidemia    on meds   Hypertension    on meds   SVT (supraventricular tachycardia) ([place])    Past Surgical History:  Procedure Laterality Date   ABDOMINAL HYSTERECTOMY     BREAST EXCISIONAL BIOPSY Right 09/16/2009   BREAST SURGERY     lumpectomy   PORTACATH PLACEMENT Left 01/26/2021   Procedure: INSERTION PORT-A-CATH;  Surgeon: [name], MD;  Location: [facility];  Service: General;  Laterality: Left;   TUBAL LIGATION       Current Meds  Medication Sig   albuterol (VENTOLIN HFA) 108 (90 Base) MCG/ACT inhaler Inhale 2 puffs into the lungs every 6 (six) hours as needed for wheezing or shortness of breath.   amLODipine (NORVASC) 10 MG tablet Take 1 tablet (10 mg total) by mouth daily.   Cholecalciferol (VITAMIN D3) 2000 units capsule Take 2,000 Units by mouth daily.   dexamethasone (DECADRON) 4 MG tablet Take 4 mg by mouth as needed. Given with chemo treatment.   diazepam (VALIUM) 5 MG tablet Take 5 mg by mouth as needed.   escitalopram (LEXAPRO) 20 MG tablet Take 1 tablet (20 mg total) by mouth daily.   fluticasone (FLONASE) 50 MCG/ACT nasal spray Use 2 sprays in each nostril daily.   gabapentin (NEURONTIN) 300 MG capsule Take 1 capsule by mouth daily and 2 capsules at bedtime   hydrochlorothiazide (HYDRODIURIL) 25 MG tablet Take  1 tablet (25 mg total) by mouth daily.   HYDROcodone-acetaminophen (NORCO) 7.5-325 MG tablet Take 1 tablet by mouth 3 (three) times daily as needed for severe pain. Must last 30 days   lidocaine-prilocaine (EMLA) cream Apply 1 application topically as needed.   LORazepam (ATIVAN) 1 MG tablet Take 1 tablet (1 mg total) by mouth as needed for anxiety (Take 1 tab by mouth1  hour prior to scan, may repeat at time of scan).   LORazepam (ATIVAN) 1 MG tablet Take 1 tablet (1 mg total) by mouth every 8 (eight) hours.   montelukast (SINGULAIR) 10 MG tablet Take 1 tablet (10 mg total) by mouth at bedtime.   ondansetron  (ZOFRAN) 8 MG tablet Take 8 mg by mouth as needed.   prochlorperazine (COMPAZINE) 10 MG tablet Take 10 mg by mouth as needed.   promethazine (PHENERGAN) 25 MG suppository Place 25 mg rectally as needed.   propranolol (INDERAL) 10 MG tablet Take 1 tablet (10 mg total) by mouth 2 (two) times daily.   [DISCONTINUED] LORazepam (ATIVAN) 1 MG tablet Take 1 tablet (1 mg total) by mouth 1 hour prior to appointment may repeat at time of scan     Allergies:   Patient has no known allergies.   Social History   Tobacco Use   Smoking status: Never   Smokeless tobacco: Never  Vaping Use   Vaping Use: Never used  Substance Use Topics   Alcohol use: Not Currently    Alcohol/week: 7.0 standard drinks    Types: 7 Standard drinks or equivalent per week    Comment: occassional   Drug use: No     Family Hx: The patient's family history includes Cancer in her maternal aunt, maternal aunt, and mother; Hypertension in her father; Uterine cancer (age of onset: 34) in her cousin. There is no history of Colon polyps, Colon cancer, Esophageal cancer, or Stomach cancer.  ROS:   Please see the history of present illness.     All other systems reviewed and are negative.   Prior CV studies:   The following studies were reviewed today:  TTE 01/22/2021 IMPRESSIONS   1. Left ventricular ejection fraction, by estimation, is 60 to 65%. The  left ventricle has normal function. The left ventricle has no regional  wall motion abnormalities. Left ventricular diastolic parameters were  normal.   2. Right ventricular systolic function is normal. The right ventricular  size is normal.   3. Left atrial size was mildly dilated.   4. The mitral valve is normal in structure. Trivial mitral valve  regurgitation. No evidence of mitral stenosis.   5. The aortic valve was not well visualized. There is mild calcification  of the aortic valve. Aortic valve regurgitation is not visualized. No  aortic stenosis is present.    6. The inferior vena cava is normal in size with greater than 50%  respiratory variability, suggesting right atrial pressure of 3 mmHg.   FINDINGS   Left Ventricle: Left ventricular ejection fraction, by estimation, is 60  to 65%. The left ventricle has normal function. The left ventricle has no  regional wall motion abnormalities. The left ventricular internal cavity  size was normal in size. There is   no left ventricular hypertrophy. Left ventricular diastolic parameters  were normal.   Right Ventricle: The right ventricular size is normal. No increase in  right ventricular wall thickness. Right ventricular systolic function is  normal.   Left Atrium: Left atrial size was mildly dilated.  Right Atrium: Right atrial size was normal in size.   Pericardium: There is no evidence of pericardial effusion.   Mitral Valve: The mitral valve is normal in structure. Trivial mitral  valve regurgitation. No evidence of mitral valve stenosis.   Tricuspid Valve: The tricuspid valve is normal in structure. Tricuspid  valve regurgitation is trivial. No evidence of tricuspid stenosis.   Aortic Valve: The aortic valve was not well visualized. There is mild  calcification of the aortic valve. Aortic valve regurgitation is not  visualized. No aortic stenosis is present.   Pulmonic Valve: The pulmonic valve was normal in structure. Pulmonic valve  regurgitation is not visualized. No evidence of pulmonic stenosis.   Aorta: The aortic root is normal in size and structure.   Venous: The inferior vena cava is normal in size with greater than 50%  respiratory variability, suggesting right atrial pressure of 3 mmHg.   IAS/Shunts: No atrial level shunt detected by color flow Doppler.  Zio monitor Patch Wear Time:  13 days and 20 hours starting March 28, 2021 Indication: Palpitations Patient had a min HR of 54 bpm, max HR of 214 bpm, and avg HR of 89 bpm. Predominant underlying rhythm was Sinus  Rhythm. 5 Supraventricular Tachycardia runs occurred, the run with the fastest interval lasting 11 beats with a max rate of 214 bpm, the longest lasting 8 beats with an avg rate of 142 bpm.    Premature atrial complexes were rare (<1.0%). Premature ventricular complexes were rare (<1.0%).   No symptoms reported.   Conclusion: This study is remarkable for paroxysmal supraventricular tachycardia which is likely atrial tachycardia with variable block.  Labs/Other Tests and Data Reviewed:    EKG:  No ECG reviewed.  Recent Labs: 01/28/2021: Magnesium 2.0 03/25/2021: TSH 1.130 06/08/2021: ALT 20; BUN 13; Creatinine, Ser 0.75; Hemoglobin 8.8; Platelets 374; Potassium 3.6; Sodium 140   Recent Lipid Panel Lab Results  Component Value Date/Time   CHOL 226 (H) 10/06/2020 02:32 PM   TRIG 196 (H) 10/06/2020 02:32 PM   HDL 51 10/06/2020 02:32 PM   CHOLHDL 4.4 10/06/2020 02:32 PM   CHOLHDL 3.1 Ratio 09/05/2009 09:18 PM   LDLCALC 140 (H) 10/06/2020 02:32 PM    Wt Readings from Last 3 Encounters:  07/08/21 250 lb (113.4 kg)  06/08/21 218 lb 14.4 oz (99.3 kg)  06/03/21 218 lb (98.9 kg)     Objective:    Vital Signs:  BP 139/80    Pulse 79    Ht 5\' 11"  (1.803 m)    Wt 250 lb (113.4 kg)    SpO2 98%    BMI 34.87 kg/m    NO physical today   ASSESSMENT & PLAN:    PSVT Hyperlipidemia  Continue her propanolol 10 mg twice daily for now. The patient does not have any unstable cardiac conditions.  Upon evaluation today, she can achieve 4 METs or greater without anginal symptoms.  According to [facility] and AHA guidelines, she requires no further cardiac workup prior to her noncardiac surgery and should be at acceptable risk.  Our service is available as necessary in the perioperative period.  COVID-19 Education: The signs and symptoms of COVID-19 were discussed with the patient and how to seek care for testing (follow up with PCP or arrange E-visit).  The importance of social distancing was discussed  today.  Time:   Today, I have spent 14 minutes with the patient with telehealth technology discussing the above problems.     Medication  Adjustments/Labs and Tests Ordered: Current medicines are reviewed at length with the patient today.  Concerns regarding medicines are outlined above.   Tests Ordered: No orders of the defined types were placed in this encounter.   Medication Changes: No orders of the defined types were placed in this encounter.   Follow Up:  In Person in 6 month(s)  Signed, [name], DO  07/08/2021 11:35 AM    [place]

## 2021-07-09 ENCOUNTER — Ambulatory Visit (HOSPITAL_BASED_OUTPATIENT_CLINIC_OR_DEPARTMENT_OTHER)
Admission: RE | Admit: 2021-07-09 | Discharge: 2021-07-09 | Disposition: A | Discharge status: Home / Self Care | Payer: Medicaid Other | Attending: Surgery | Admitting: Surgery

## 2021-07-09 ENCOUNTER — Ambulatory Visit
Admission: RE | Admit: 2021-07-09 | Discharge: 2021-07-09 | Disposition: A | Discharge status: Home / Self Care | Payer: Medicaid Other | Source: Ambulatory Visit | Attending: Surgery | Admitting: Surgery

## 2021-07-09 ENCOUNTER — Ambulatory Visit (HOSPITAL_BASED_OUTPATIENT_CLINIC_OR_DEPARTMENT_OTHER): Payer: Medicaid Other | Admitting: Anesthesiology

## 2021-07-09 ENCOUNTER — Encounter (HOSPITAL_BASED_OUTPATIENT_CLINIC_OR_DEPARTMENT_OTHER)
Admission: RE | Disposition: A | Discharge status: Home / Self Care | Payer: Self-pay | Source: Home / Self Care | Attending: Surgery

## 2021-07-09 ENCOUNTER — Encounter (HOSPITAL_BASED_OUTPATIENT_CLINIC_OR_DEPARTMENT_OTHER): Payer: Self-pay | Admitting: Surgery

## 2021-07-09 ENCOUNTER — Encounter: Payer: Self-pay | Admitting: Oncology

## 2021-07-09 ENCOUNTER — Other Ambulatory Visit: Payer: Self-pay

## 2021-07-09 ENCOUNTER — Other Ambulatory Visit (HOSPITAL_COMMUNITY): Payer: Self-pay

## 2021-07-09 DIAGNOSIS — Z6830 Body mass index (BMI) 30.0-30.9, adult: Secondary | ICD-10-CM | POA: Diagnosis not present

## 2021-07-09 DIAGNOSIS — I10 Essential (primary) hypertension: Secondary | ICD-10-CM | POA: Insufficient documentation

## 2021-07-09 DIAGNOSIS — C50911 Malignant neoplasm of unspecified site of right female breast: Secondary | ICD-10-CM

## 2021-07-09 DIAGNOSIS — J449 Chronic obstructive pulmonary disease, unspecified: Secondary | ICD-10-CM | POA: Insufficient documentation

## 2021-07-09 DIAGNOSIS — F32A Depression, unspecified: Secondary | ICD-10-CM | POA: Insufficient documentation

## 2021-07-09 DIAGNOSIS — Z452 Encounter for adjustment and management of vascular access device: Secondary | ICD-10-CM | POA: Insufficient documentation

## 2021-07-09 DIAGNOSIS — Z86718 Personal history of other venous thrombosis and embolism: Secondary | ICD-10-CM | POA: Diagnosis not present

## 2021-07-09 DIAGNOSIS — Z79899 Other long term (current) drug therapy: Secondary | ICD-10-CM | POA: Insufficient documentation

## 2021-07-09 DIAGNOSIS — C50912 Malignant neoplasm of unspecified site of left female breast: Secondary | ICD-10-CM | POA: Insufficient documentation

## 2021-07-09 DIAGNOSIS — I471 Supraventricular tachycardia: Secondary | ICD-10-CM | POA: Insufficient documentation

## 2021-07-09 DIAGNOSIS — E669 Obesity, unspecified: Secondary | ICD-10-CM | POA: Insufficient documentation

## 2021-07-09 HISTORY — DX: Supraventricular tachycardia: I47.1

## 2021-07-09 HISTORY — PX: PORT-A-CATH REMOVAL: SHX5289

## 2021-07-09 HISTORY — DX: Supraventricular tachycardia, unspecified: I47.10

## 2021-07-09 HISTORY — PX: RADIOACTIVE SEED GUIDED AXILLARY SENTINEL LYMPH NODE: SHX6735

## 2021-07-09 HISTORY — PX: AXILLARY SENTINEL NODE BIOPSY: SHX5738

## 2021-07-09 HISTORY — PX: BREAST LUMPECTOMY WITH RADIOACTIVE SEED LOCALIZATION: SHX6424

## 2021-07-09 SURGERY — BREAST LUMPECTOMY WITH RADIOACTIVE SEED LOCALIZATION
Anesthesia: General | Site: Chest | Laterality: Right

## 2021-07-09 MED ORDER — METOPROLOL TARTRATE 25 MG PO TABS
25.0000 mg | ORAL_TABLET | Freq: Once | ORAL | Status: AC
  Administered 2021-07-09: 25 mg via ORAL

## 2021-07-09 MED ORDER — SCOPOLAMINE 1 MG/3DAYS TD PT72
MEDICATED_PATCH | TRANSDERMAL | Status: AC
  Filled 2021-07-09: qty 1

## 2021-07-09 MED ORDER — HYDROMORPHONE HCL 1 MG/ML IJ SOLN
INTRAMUSCULAR | Status: AC
  Filled 2021-07-09: qty 0.5

## 2021-07-09 MED ORDER — LACTATED RINGERS IV SOLN: INTRAVENOUS | Status: DC

## 2021-07-09 MED ORDER — OXYCODONE HCL 5 MG/5ML PO SOLN: 5.0000 mg | Freq: Once | ORAL | Status: DC | PRN

## 2021-07-09 MED ORDER — DEXAMETHASONE SODIUM PHOSPHATE 10 MG/ML IJ SOLN
INTRAMUSCULAR | Status: AC
  Filled 2021-07-09: qty 1

## 2021-07-09 MED ORDER — MIDAZOLAM HCL 2 MG/2ML IJ SOLN
INTRAMUSCULAR | Status: AC
  Filled 2021-07-09: qty 2

## 2021-07-09 MED ORDER — MIDAZOLAM HCL 5 MG/5ML IJ SOLN
INTRAMUSCULAR | Status: DC | PRN
  Administered 2021-07-09: 2 mg via INTRAVENOUS

## 2021-07-09 MED ORDER — PROMETHAZINE HCL 25 MG/ML IJ SOLN
6.2500 mg | INTRAMUSCULAR | Status: AC | PRN
  Administered 2021-07-09 (×2): 6.25 mg via INTRAVENOUS

## 2021-07-09 MED ORDER — FENTANYL CITRATE (PF) 100 MCG/2ML IJ SOLN
INTRAMUSCULAR | Status: AC
  Filled 2021-07-09: qty 2

## 2021-07-09 MED ORDER — CHLORHEXIDINE GLUCONATE CLOTH 2 % EX PADS: 6.0000 | MEDICATED_PAD | Freq: Once | CUTANEOUS | Status: DC

## 2021-07-09 MED ORDER — PROPOFOL 10 MG/ML IV BOLUS
INTRAVENOUS | Status: AC
  Filled 2021-07-09: qty 20

## 2021-07-09 MED ORDER — ROCURONIUM BROMIDE 100 MG/10ML IV SOLN
INTRAVENOUS | Status: DC | PRN
  Administered 2021-07-09: 60 mg via INTRAVENOUS

## 2021-07-09 MED ORDER — SCOPOLAMINE 1 MG/3DAYS TD PT72
1.0000 | MEDICATED_PATCH | TRANSDERMAL | Status: DC
  Administered 2021-07-09: 1.5 mg via TRANSDERMAL

## 2021-07-09 MED ORDER — ONDANSETRON HCL 4 MG/2ML IJ SOLN
INTRAMUSCULAR | Status: AC
  Filled 2021-07-09: qty 2

## 2021-07-09 MED ORDER — FENTANYL CITRATE (PF) 100 MCG/2ML IJ SOLN
INTRAMUSCULAR | Status: DC | PRN
  Administered 2021-07-09: 100 ug via INTRAVENOUS

## 2021-07-09 MED ORDER — CEFAZOLIN SODIUM-DEXTROSE 2-4 GM/100ML-% IV SOLN
2.0000 g | INTRAVENOUS | Status: AC
  Administered 2021-07-09: 2 g via INTRAVENOUS

## 2021-07-09 MED ORDER — FENTANYL CITRATE (PF) 100 MCG/2ML IJ SOLN
100.0000 ug | Freq: Once | INTRAMUSCULAR | Status: AC
  Administered 2021-07-09: 100 ug via INTRAVENOUS

## 2021-07-09 MED ORDER — MIDAZOLAM HCL 2 MG/2ML IJ SOLN
2.0000 mg | Freq: Once | INTRAMUSCULAR | Status: AC
  Administered 2021-07-09: 2 mg via INTRAVENOUS

## 2021-07-09 MED ORDER — ACETAMINOPHEN 500 MG PO TABS: 1000.0000 mg | ORAL_TABLET | Freq: Once | ORAL | Status: DC

## 2021-07-09 MED ORDER — HYDROMORPHONE HCL 1 MG/ML IJ SOLN
0.2500 mg | INTRAMUSCULAR | Status: DC | PRN
  Administered 2021-07-09: 0.5 mg via INTRAVENOUS

## 2021-07-09 MED ORDER — MEPERIDINE HCL 25 MG/ML IJ SOLN: 6.2500 mg | INTRAMUSCULAR | Status: DC | PRN

## 2021-07-09 MED ORDER — PHENYLEPHRINE 40 MCG/ML (10ML) SYRINGE FOR IV PUSH (FOR BLOOD PRESSURE SUPPORT)
PREFILLED_SYRINGE | INTRAVENOUS | Status: AC
  Filled 2021-07-09: qty 10

## 2021-07-09 MED ORDER — LIDOCAINE 2% (20 MG/ML) 5 ML SYRINGE
INTRAMUSCULAR | Status: DC | PRN
Start: 2021-07-09 — End: 2021-07-09
  Administered 2021-07-09: 40 mg via INTRAVENOUS

## 2021-07-09 MED ORDER — CEFAZOLIN SODIUM-DEXTROSE 2-4 GM/100ML-% IV SOLN
INTRAVENOUS | Status: AC
  Filled 2021-07-09: qty 100

## 2021-07-09 MED ORDER — MAGTRACE LYMPHATIC TRACER
INTRAMUSCULAR | Status: DC | PRN
  Administered 2021-07-09: 2 mL via INTRAMUSCULAR

## 2021-07-09 MED ORDER — OXYCODONE HCL 5 MG PO TABS: 5.0000 mg | ORAL_TABLET | Freq: Once | ORAL | Status: DC | PRN

## 2021-07-09 MED ORDER — BUPIVACAINE-EPINEPHRINE (PF) 0.5% -1:200000 IJ SOLN
INTRAMUSCULAR | Status: DC | PRN
  Administered 2021-07-09 (×2): 20 mL

## 2021-07-09 MED ORDER — ACETAMINOPHEN 500 MG PO TABS
ORAL_TABLET | ORAL | Status: AC
  Filled 2021-07-09: qty 2

## 2021-07-09 MED ORDER — PROMETHAZINE HCL 25 MG/ML IJ SOLN
INTRAMUSCULAR | Status: AC
  Filled 2021-07-09: qty 1

## 2021-07-09 MED ORDER — PHENYLEPHRINE HCL (PRESSORS) 10 MG/ML IV SOLN
INTRAVENOUS | Status: DC | PRN
  Administered 2021-07-09: 40 ug via INTRAVENOUS
  Administered 2021-07-09: 120 ug via INTRAVENOUS
  Administered 2021-07-09: 80 ug via INTRAVENOUS
  Administered 2021-07-09 (×3): 40 ug via INTRAVENOUS

## 2021-07-09 MED ORDER — PROPOFOL 10 MG/ML IV BOLUS
INTRAVENOUS | Status: DC | PRN
  Administered 2021-07-09: 200 mg via INTRAVENOUS

## 2021-07-09 MED ORDER — METOPROLOL TARTRATE 25 MG PO TABS
ORAL_TABLET | ORAL | Status: AC
  Filled 2021-07-09: qty 1

## 2021-07-09 MED ORDER — DEXAMETHASONE SODIUM PHOSPHATE 4 MG/ML IJ SOLN
INTRAMUSCULAR | Status: DC | PRN
Start: 2021-07-09 — End: 2021-07-09
  Administered 2021-07-09: 10 mg via INTRAVENOUS

## 2021-07-09 MED ORDER — SUGAMMADEX SODIUM 200 MG/2ML IV SOLN
INTRAVENOUS | Status: DC | PRN
  Administered 2021-07-09: 200 mg via INTRAVENOUS

## 2021-07-09 MED ORDER — MIDAZOLAM HCL 2 MG/2ML IJ SOLN: 0.5000 mg | Freq: Once | INTRAMUSCULAR | Status: DC | PRN

## 2021-07-09 MED ORDER — ONDANSETRON HCL 4 MG/2ML IJ SOLN
INTRAMUSCULAR | Status: DC | PRN
  Administered 2021-07-09: 4 mg via INTRAVENOUS

## 2021-07-09 MED ORDER — OXYCODONE HCL 5 MG PO TABS
5.0000 mg | ORAL_TABLET | Freq: Four times a day (QID) | ORAL | 0 refills | Status: DC | PRN
  Filled 2021-07-09: qty 30, 5d supply, fill #0

## 2021-07-09 MED ORDER — BUPIVACAINE-EPINEPHRINE 0.5% -1:200000 IJ SOLN
INTRAMUSCULAR | Status: DC | PRN
  Administered 2021-07-09: 26 mL

## 2021-07-09 SURGICAL SUPPLY — 44 items
ADH SKN CLS APL DERMABOND .7 (GAUZE/BANDAGES/DRESSINGS) ×8
APL PRP STRL LF DISP 70% ISPRP (MISCELLANEOUS) ×8
APPLIER CLIP 9.375 MED OPEN (MISCELLANEOUS) ×5
APR CLP MED 9.3 20 MLT OPN (MISCELLANEOUS) ×4
BINDER BREAST 3XL (GAUZE/BANDAGES/DRESSINGS) ×1 IMPLANT
BLADE SURG 15 STRL LF DISP TIS (BLADE) ×10 IMPLANT
BLADE SURG 15 STRL SS (BLADE) ×5
CANISTER SUCT 1200ML W/VALVE (MISCELLANEOUS) ×6 IMPLANT
CHLORAPREP W/TINT 26 (MISCELLANEOUS) ×12 IMPLANT
CLIP APPLIE 9.375 MED OPEN (MISCELLANEOUS) IMPLANT
COVER BACK TABLE 60X90IN (DRAPES) ×6 IMPLANT
COVER MAYO STAND STRL (DRAPES) ×6 IMPLANT
COVER PROBE W GEL 5X96 (DRAPES) ×7 IMPLANT
DERMABOND ADVANCED (GAUZE/BANDAGES/DRESSINGS) ×2
DERMABOND ADVANCED .7 DNX12 (GAUZE/BANDAGES/DRESSINGS) ×10 IMPLANT
DRAPE LAPAROSCOPIC ABDOMINAL (DRAPES) ×6 IMPLANT
DRAPE UTILITY XL STRL (DRAPES) ×6 IMPLANT
ELECT REM PT RETURN 9FT ADLT (ELECTROSURGICAL) ×5
ELECTRODE REM PT RTRN 9FT ADLT (ELECTROSURGICAL) ×5 IMPLANT
GLOVE SURG SIGNA 7.5 PF LTX (GLOVE) ×6 IMPLANT
GOWN STRL REUS W/ TWL LRG LVL3 (GOWN DISPOSABLE) ×5 IMPLANT
GOWN STRL REUS W/ TWL XL LVL3 (GOWN DISPOSABLE) ×5 IMPLANT
GOWN STRL REUS W/TWL LRG LVL3 (GOWN DISPOSABLE) ×5
GOWN STRL REUS W/TWL XL LVL3 (GOWN DISPOSABLE) ×5
KIT MARKER MARGIN INK (KITS) ×6 IMPLANT
NDL HYPO 25X1 1.5 SAFETY (NEEDLE) ×10 IMPLANT
NDL SAFETY ECLIPSE 18X1.5 (NEEDLE) ×5 IMPLANT
NEEDLE HYPO 18GX1.5 SHARP (NEEDLE) ×5
NEEDLE HYPO 25X1 1.5 SAFETY (NEEDLE) ×10 IMPLANT
NS IRRIG 1000ML POUR BTL (IV SOLUTION) ×6 IMPLANT
PACK BASIN DAY SURGERY FS (CUSTOM PROCEDURE TRAY) ×6 IMPLANT
PENCIL SMOKE EVACUATOR (MISCELLANEOUS) ×6 IMPLANT
SLEEVE SCD COMPRESS KNEE MED (STOCKING) ×6 IMPLANT
SPONGE T-LAP 4X18 ~~LOC~~+RFID (SPONGE) ×7 IMPLANT
SUT MNCRL AB 4-0 PS2 18 (SUTURE) ×12 IMPLANT
SUT VIC AB 3-0 SH 27 (SUTURE) ×10
SUT VIC AB 3-0 SH 27X BRD (SUTURE) ×5 IMPLANT
SYR BULB EAR ULCER 3OZ GRN STR (SYRINGE) ×6 IMPLANT
SYR CONTROL 10ML LL (SYRINGE) ×12 IMPLANT
TOWEL GREEN STERILE FF (TOWEL DISPOSABLE) ×6 IMPLANT
TRACER MAGTRACE VIAL (MISCELLANEOUS) ×1 IMPLANT
TRAY FAXITRON CT DISP (TRAY / TRAY PROCEDURE) ×12 IMPLANT
TUBE CONNECTING 20X1/4 (TUBING) ×6 IMPLANT
YANKAUER SUCT BULB TIP NO VENT (SUCTIONS) ×6 IMPLANT

## 2021-07-09 NOTE — Transfer of Care (Signed)
Immediate Anesthesia Transfer of Care Note  Patient: Chelsea Hansen  Procedure(s) Performed: BILATERAL BREAST LUMPECTOMY WITH RADIOACTIVE SEED LOCALIZATION (Bilateral: Breast) RADIOACTIVE SEED GUIDED RIGHT AXILLARY SENTINEL LYMPH NODE DISSECTION (Right: Breast) LEFT AXILLARY SENTINEL NODE BIOPSY (Left: Breast) REMOVAL PORT-A-CATH (Left: Chest)  Patient Location: PACU  Anesthesia Type:General and Regional  Level of Consciousness: drowsy  Airway & Oxygen Therapy: Patient Spontanous Breathing and Patient connected to face mask oxygen  Post-op Assessment: Report given to RN and Post -op Vital signs reviewed and stable  Post vital signs: Reviewed and stable  Last Vitals:  Vitals Value Taken Time  BP    Temp    Pulse 87 07/09/21 1059  Resp 17 07/09/21 1059  SpO2 100 % 07/09/21 1059  Vitals shown include unvalidated device data.  Last Pain:  Vitals:   07/09/21 0740  TempSrc: Oral  PainSc: 0-No pain      Patients Stated Pain Goal: 3 (23/53/61 [id number])  Complications: No notable events documented.

## 2021-07-09 NOTE — Anesthesia Procedure Notes (Signed)
Procedure Name: Intubation Date/Time: 07/09/2021 9:19 AM Performed by: Ezequiel Kayser, CRNA Pre-anesthesia Checklist: Patient identified, Emergency Drugs available, Suction available and Patient being monitored Patient Re-evaluated:Patient Re-evaluated prior to induction Oxygen Delivery Method: Circle System Utilized Preoxygenation: Pre-oxygenation with 100% oxygen Induction Type: IV induction Ventilation: Mask ventilation without difficulty Laryngoscope Size: Mac and 4 Grade View: Grade I Tube type: Oral Tube size: 7.0 mm Number of attempts: 1 Airway Equipment and Method: Stylet and Oral airway Placement Confirmation: ETT inserted through vocal cords under direct vision, positive ETCO2 and breath sounds checked- equal and bilateral Secured at: 22 cm Tube secured with: Tape Dental Injury: Teeth and Oropharynx as per pre-operative assessment

## 2021-07-09 NOTE — Interval H&P Note (Signed)
History and Physical Interval Note: no change in H and P  07/09/2021 8:00 AM  Chelsea Hansen  has presented today for surgery, with the diagnosis of BILATERAL BREAST CANCER.  The various methods of treatment have been discussed with the patient and family. After consideration of risks, benefits and other options for treatment, the patient has consented to  Procedure(s): BILATERAL BREAST LUMPECTOMY WITH RADIOACTIVE SEED LOCALIZATION (Bilateral) RADIOACTIVE SEED GUIDED RIGHT AXILLARY SENTINEL LYMPH NODE DISSECTION (Right) LEFT AXILLARY SENTINEL NODE BIOPSY (Left) REMOVAL PORT-A-CATH (N/A) as a surgical intervention.  The patient's history has been reviewed, patient examined, no change in status, stable for surgery.  I have reviewed the patient's chart and labs.  Questions were answered to the patient's satisfaction.     [name]

## 2021-07-09 NOTE — Anesthesia Procedure Notes (Signed)
Anesthesia Regional Block: Pectoralis block   Pre-Anesthetic Checklist: , timeout performed,  Correct Patient, Correct Site, Correct Laterality,  Correct Procedure, Correct Position, site marked,  Risks and benefits discussed,  Surgical consent,  Pre-op evaluation,  At surgeon's request and post-op pain management  Laterality: Right  Prep: chloraprep       Needles:  Injection technique: Single-shot  Needle Type: Echogenic Needle     Needle Length: 9cm  Needle Gauge: 21     Additional Needles:   Procedures:,,,, ultrasound used (permanent image in chart),,    Narrative:  Start time: 07/09/2021 8:32 AM End time: 07/09/2021 8:34 AM Injection made incrementally with aspirations every 5 mL.  Performed by: Personally  Anesthesiologist: Annye Asa, MD  Additional Notes: Pt identified in Holding room.  Monitors applied. Working IV access confirmed. Sterile prep R clavicle and pec.  #21ga ECHOgenic Arrow block needle between pec minor and intercostal, ribs 4,5 with US guidance.  20cc 0.5% Bupivacaine 1:200k epi injected incrementally after negative test dose, good planar spread of local.  Patient asymptomatic, VSS, no heme aspirated, tolerated well.   Jenita Seashore, MD

## 2021-07-09 NOTE — Anesthesia Procedure Notes (Signed)
Anesthesia Regional Block: Pectoralis block   Pre-Anesthetic Checklist: , timeout performed,  Correct Patient, Correct Site, Correct Laterality,  Correct Procedure, Correct Position, site marked,  Risks and benefits discussed,  Surgical consent,  Pre-op evaluation,  At surgeon's request and post-op pain management  Laterality: Left  Prep: chloraprep       Needles:  Injection technique: Single-shot  Needle Type: Echogenic Needle     Needle Length: 9cm  Needle Gauge: 21     Additional Needles:   Procedures:,,,, ultrasound used (permanent image in chart),,    Narrative:  Start time: 07/09/2021 8:24 AM End time: 07/09/2021 8:31 AM Injection made incrementally with aspirations every 5 mL.  Performed by: Personally  Anesthesiologist: Annye Asa, MD  Additional Notes: Pt identified in Holding room.  Monitors applied. Working IV access confirmed. Sterile prep L clavicle and pec.  #21ga ECHOgenic Arrow block needle between pec minor and intercostal, ribs 4,5 with US guidance.  20cc 0.5% Bupivacaine 1:200k epi injected incrementally after negative test dose, good planar spread of local.  Patient asymptomatic, VSS, no heme aspirated, tolerated well.   Jenita Seashore, MD

## 2021-07-09 NOTE — Progress Notes (Signed)
Assisted Dr. Annye Asa with right, left, ultrasound guided, pectoralis block. Side rails up, monitors on throughout procedure. See vital signs in flow sheet. Tolerated Procedure well.

## 2021-07-09 NOTE — Op Note (Signed)
Chelsea Hansen 07/09/2021   Pre-op Diagnosis: BILATERAL BREAST CANCER     Post-op Diagnosis: same  Procedure(s): BILATERAL BREAST LUMPECTOMY WITH RADIOACTIVE SEED LOCALIZATION RADIOACTIVE SEED GUIDED RIGHT AXILLARY TARGETED LYMPH NODE DISSECTION LEFT AXILLARY SENTINEL NODE BIOPSY LEFT AXILLARY LYMPH NODE MAPPING WITH SENTIMAG REMOVAL PORT-A-CATH  Surgeon(s): [name], MD  Anesthesia: General  Staff:  Circulator: Izora Ribas, RN Relief Circulator: Donnita Falls, RN Scrub Person: Lorenza Burton, CST  Estimated Blood Loss: Minimal               Specimens: sent to path  Indications: This is a patient with bilateral breast cancer which was node positive on the right.  She underwent neoadjuvant chemotherapy.  She now presents for removal of the bilateral breast cancers with a targeted right axillary lymph node dissection and left sentinel lymph node dissection as well as Port-A-Cath removal.  Procedure: The patient was identified in the preoperative holding area and after prepping the left breast I injected sent a mag tracer underneath the nipple areolar complex.  The patient is brought to operating identifies correct patient.  She is placed upon the operating table general anesthesia was induced.  Her bilateral breasts, chest, and axilla were then prepped and draped in usual sterile fashion.  I first turned my attention toward the right breast cancer.  This was the large palpable cancer that had regressed significantly.  I anesthetized the lateral edge of the areola with Marcaine and then dissected down to the breast tissue with electrocautery.  I then dissected laterally toward the radioactive seed with the cautery.  I reached the area of the radioactive seed and palpable mass and excised it in its entirety staying around it widely with the electrocautery.  Once the lumpectomy specimen was removed I then painted the margins with paint.  An x-ray was performed confirming  that the original biopsy clip and radioactive seed were in the specimen.  With the aid of the neoprobe I then identified the targeted lymph node in the right axilla.  I anesthetized the skin with Marcaine and made incision with a scalpel.  With the aid of the neoprobe I then dissected into the deep axillary tissue and removed the target lymph node as well as surrounding lymph nodes with electrocautery.  These were sent to pathology for evaluation as well. I then closed both these incisions with interrupted 3-0 Vicryl sutures and running 4-0 Monocryl.  Prior to closing the breast incision I placed surgical clips around the lumpectomy cavity for marker purposes.  I next identified the radioactive seed in the lateral left breast.  This was close to the axilla.  I elected to anesthetize skin the axilla made a incision in the axilla with the scalpel.  I then dissected medially toward the breast and radioactive seed with the aid of the neoprobe.  This was the much smaller malignancy that had resolved on MRI.  I grasped the area and elevated with a Allis clamp when I reached the area.  This caused the seed to pop out of the area.  I completed the lumpectomy around this area.  The original tissue marker has been displaced and was expected not to be in the specimen.  We x-rayed the specimen and could see scarring in this area consistent with the mammogram.  I marked this with paint and sent to pathology for evaluation.  I palpated the area and found no other abnormalities.  Using the symptomatic probe I identified an area  of increased uptake in the deep left axilla.  I dissected into the deep left axillary tissue and was able to easily identify a large discolored lymph node.  I grasped this with a clamp and completely excised the lymph node with the surrounding tissue.  This was sent to pathology for evaluation.  There was no other uptake of tracer in the axilla with the probe and I palpated no other enlarged nodes.   Hemostasis was achieved with the cautery.  I then closed this incision with interrupted 3-0 Vicryl sutures and ran the skin with a running 4-0 Monocryl.  Finally, I anesthetized the skin at the old scar of the Port-A-Cath site.  I made incision through the scar with a scalpel and then applied the Port-A-Cath.  I excised the Prolene suture and then was able to easily remove the port and catheter in its entirety.  I closed the catheter tract with a figure-of-eight 3-0 Vicryl suture.  I then closed the subcutaneous tissue with interrupted 3-0 Vicryl sutures and closed the skin with a running 4-0 Monocryl.  Dermabond was then applied.  The patient tolerated the procedure well.  All the counts were correct at the end of the procedure.  The patient was then extubated in the operating room and taken in a stable condition to the recovery room.          [name]   Date: 07/09/2021  Time: 10:50 AM

## 2021-07-09 NOTE — Discharge Instructions (Addendum)
[facility] °Office Phone Number [phone] ° °BREAST BIOPSY/ PARTIAL MASTECTOMY: POST OP INSTRUCTIONS ° °Always review your discharge instruction sheet given to you by the facility where your surgery was performed. ° °IF YOU HAVE DISABILITY OR FAMILY LEAVE FORMS, YOU MUST BRING THEM TO THE OFFICE FOR PROCESSING.  DO NOT GIVE THEM TO YOUR DOCTOR. ° °A prescription for pain medication may be given to you upon discharge.  Take your pain medication as prescribed, if needed.  If narcotic pain medicine is not needed, then you may take acetaminophen (Tylenol) or ibuprofen (Advil) as needed. °Take your usually prescribed medications unless otherwise directed °If you need a refill on your pain medication, please contact your pharmacy.  They will contact our office to request authorization.  Prescriptions will not be filled after 5pm or on week-ends. °You should eat very light the first 24 hours after surgery, such as soup, crackers, pudding, etc.  Resume your normal diet the day after surgery. °Most patients will experience some swelling and bruising in the breast.  Ice packs and a good support bra will help.  Swelling and bruising can take several days to resolve.  °It is common to experience some constipation if taking pain medication after surgery.  Increasing fluid intake and taking a stool softener will usually help or prevent this problem from occurring.  A mild laxative (Milk of Magnesia or Miralax) should be taken according to package directions if there are no bowel movements after 48 hours. °Unless discharge instructions indicate otherwise, you may remove your bandages 24-48 hours after surgery, and you may shower at that time.  You may have steri-strips (small skin tapes) in place directly over the incision.  These strips should be left on the skin for 7-10 days.  If your surgeon used skin glue on the incision, you may shower in 24 hours.  The glue will flake off over the next 2-3 weeks.  Any  sutures or staples will be removed at the office during your follow-up visit. °ACTIVITIES:  You may resume regular daily activities (gradually increasing) beginning the next day.  Wearing a good support bra or sports bra minimizes pain and swelling.  You may have sexual intercourse when it is comfortable. °You may drive when you no longer are taking prescription pain medication, you can comfortably wear a seatbelt, and you can safely maneuver your car and apply brakes. °RETURN TO WORK:  ______________________________________________________________________________________ °You should see your doctor in the office for a follow-up appointment approximately two weeks after your surgery.  Your doctor’s nurse will typically make your follow-up appointment when she calls you with your pathology report.  Expect your pathology report 2-3 business days after your surgery.  You may call to check if you do not hear from us after three days. °OTHER INSTRUCTIONS: OK TO REMOVE THE BINDER AND SHOWER STARTING TOMORROW °ICE PACK, TYLENOL, AND IBUPROFEN ALSO FOR PAIN °NO VIGOROUS ACTIVITY FOR ONE WEEK °_______________________________________________________________________________________________ _____________________________________________________________________________________________________________________________________ °_____________________________________________________________________________________________________________________________________ °_____________________________________________________________________________________________________________________________________ ° °WHEN TO CALL YOUR DOCTOR: °Fever over 101.0 °Nausea and/or vomiting. °Extreme swelling or bruising. °Continued bleeding from incision. °Increased pain, redness, or drainage from the incision. ° °The clinic staff is available to answer your questions during regular business hours.  Please don’t hesitate to call and ask to speak to one of the  nurses for clinical concerns.  If you have a medical emergency, go to the nearest emergency room or call 911.  A surgeon from [facility] is always on call at the hospital. ° °For   further questions, please visit [email or web address]    No Tylenol until after 2pm today if needed   Post Anesthesia Home Care Instructions  Activity: Get plenty of rest for the remainder of the day. A responsible individual must stay with you for 24 hours following the procedure.  For the next 24 hours, DO NOT: -Drive a car -Paediatric nurse -Drink alcoholic beverages -Take any medication unless instructed by your physician -Make any legal decisions or sign important papers.  Meals: Start with liquid foods such as gelatin or soup. Progress to regular foods as tolerated. Avoid greasy, spicy, heavy foods. If nausea and/or vomiting occur, drink only clear liquids until the nausea and/or vomiting subsides. Call your physician if vomiting continues.  Special Instructions/Symptoms: Your throat may feel dry or sore from the anesthesia or the breathing tube placed in your throat during surgery. If this causes discomfort, gargle with warm salt water. The discomfort should disappear within 24 hours.  If you had a scopolamine patch placed behind your ear for the management of post- operative nausea and/or vomiting:  1. The medication in the patch is effective for 72 hours, after which it should be removed.  Wrap patch in a tissue and discard in the trash. Wash hands thoroughly with soap and water. 2. You may remove the patch earlier than 72 hours if you experience unpleasant side effects which may include dry mouth, dizziness or visual disturbances. 3. Avoid touching the patch. Wash your hands with soap and water after contact with the patch.

## 2021-07-09 NOTE — Anesthesia Preprocedure Evaluation (Addendum)
Anesthesia Evaluation  Patient identified by MRN, date of birth, ID band Patient awake    Reviewed: Allergy & Precautions, NPO status , Patient's Chart, lab work & pertinent test results, reviewed documented beta blocker date and time   History of Anesthesia Complications Negative for: history of anesthetic complications  Airway Mallampati: II  TM Distance: >3 FB Neck ROM: Full    Dental  (+) Missing, Dental Advisory Given   Pulmonary asthma , COPD,  COPD inhaler,    breath sounds clear to auscultation       Cardiovascular hypertension, Pt. on medications and Pt. on home beta blockers (-) angina+ DVT  + dysrhythmias Supra Ventricular Tachycardia  Rhythm:Regular Rate:Normal  01/2021 ECHO: EF 60- 65%. LV has normal function, no regional wall motion abnormalities. Left ventricular diastolic parameters were normal. RVF is normal. No significant valvular abnormalities   Neuro/Psych  Headaches, Anxiety Depression    GI/Hepatic Neg liver ROS, GERD  Controlled,  Endo/Other  obese  Renal/GU negative Renal ROS     Musculoskeletal   Abdominal (+) + obese,   Peds  Hematology negative hematology ROS (+)   Anesthesia Other Findings Breast cancer  Reproductive/Obstetrics                            Anesthesia Physical Anesthesia Plan  ASA: 3  Anesthesia Plan: General   Post-op Pain Management: Tylenol PO (pre-op) and Regional block   Induction: Intravenous  PONV Risk Score and Plan: 3 and Ondansetron, Dexamethasone, Treatment may vary due to age or medical condition and Scopolamine patch - Pre-op  Airway Management Planned: Oral ETT  Additional Equipment: None  Intra-op Plan:   Post-operative Plan: Extubation in OR  Informed Consent: I have reviewed the patients History and Physical, chart, labs and discussed the procedure including the risks, benefits and alternatives for the proposed  anesthesia with the patient or authorized representative who has indicated his/her understanding and acceptance.     Dental advisory given  Plan Discussed with: CRNA and Surgeon  Anesthesia Plan Comments: (Bilateral pectoralis blocks)      Anesthesia Quick Evaluation

## 2021-07-10 ENCOUNTER — Encounter (HOSPITAL_BASED_OUTPATIENT_CLINIC_OR_DEPARTMENT_OTHER): Payer: Self-pay | Admitting: Surgery

## 2021-07-10 ENCOUNTER — Ambulatory Visit: Payer: Medicaid Other | Admitting: Cardiology

## 2021-07-10 NOTE — Anesthesia Postprocedure Evaluation (Signed)
Anesthesia Post Note  Patient: Chelsea Hansen  Procedure(s) Performed: BILATERAL BREAST LUMPECTOMY WITH RADIOACTIVE SEED LOCALIZATION (Bilateral: Breast) RADIOACTIVE SEED GUIDED RIGHT AXILLARY SENTINEL LYMPH NODE DISSECTION (Right: Breast) LEFT AXILLARY SENTINEL NODE BIOPSY (Left: Breast) REMOVAL PORT-A-CATH (Left: Chest)     Patient location during evaluation: PACU Anesthesia Type: General Level of consciousness: patient cooperative, oriented and sedated Pain management: pain level controlled Vital Signs Assessment: post-procedure vital signs reviewed and stable Respiratory status: spontaneous breathing, nonlabored ventilation and respiratory function stable Cardiovascular status: blood pressure returned to baseline and stable Postop Assessment: no apparent nausea or vomiting (nausea improved) Anesthetic complications: no Comments: delayed entry: pt eval in PACU post op   No notable events documented.  Last Vitals:  Vitals:   07/09/21 1200 07/09/21 1354  BP: (!) 169/97 (!) 145/88  Pulse: 65 60  Resp: 18 18  Temp:  (!) 36.1 C  SpO2: 95% 96%                  Zsofia Prout,E. Shawanna Zanders

## 2021-07-13 ENCOUNTER — Other Ambulatory Visit (HOSPITAL_COMMUNITY): Payer: Self-pay

## 2021-07-13 ENCOUNTER — Ambulatory Visit (INDEPENDENT_AMBULATORY_CARE_PROVIDER_SITE_OTHER): Payer: Medicaid Other | Admitting: Primary Care

## 2021-07-13 ENCOUNTER — Other Ambulatory Visit: Payer: Self-pay

## 2021-07-13 ENCOUNTER — Encounter (INDEPENDENT_AMBULATORY_CARE_PROVIDER_SITE_OTHER): Payer: Self-pay | Admitting: Primary Care

## 2021-07-13 DIAGNOSIS — I10 Essential (primary) hypertension: Secondary | ICD-10-CM | POA: Diagnosis not present

## 2021-07-13 MED ORDER — AMLODIPINE BESYLATE 10 MG PO TABS
10.0000 mg | ORAL_TABLET | Freq: Every day | ORAL | 1 refills | Status: DC
  Filled 2021-07-13 – 2021-11-23 (×2): qty 90, 90d supply, fill #0
  Filled 2022-02-23: qty 90, 90d supply, fill #1

## 2021-07-13 MED ORDER — HYDROCHLOROTHIAZIDE 25 MG PO TABS
25.0000 mg | ORAL_TABLET | Freq: Every day | ORAL | 1 refills | Status: DC
  Filled 2021-07-13: qty 90, 90d supply, fill #0
  Filled 2021-10-27: qty 90, 90d supply, fill #1

## 2021-07-13 NOTE — Progress Notes (Signed)
[facility]   Subjective:  Chelsea Hansen is a 51 y.o. female presents for emergency room f/u from [place] dx and treated for Sinus infection and asthma. Patient called from the hospital in [place] requesting to speak with her PCP for advise. Today at this visit informed patient I can not practice out of [place]. Today she feels a lot better she denies shortness of breath, wheezing, coughing or headaches.  Patient is followed by oncology bilateral breast cancer and on the right side more areas of suspicion and masses removed area is sore-   Past Medical History:  Diagnosis Date   Allergy    seasonal allergies   Anxiety    on meds   Asthma    uses inhaler   Breast cancer ([place]) 2012   RIGHT lumpectomy   Cancer ([place]) 2022   RIGHT breast lump-dx 2022   Depression    on meds   DVT (deep venous thrombosis) ([place]) 2010   after hysterectomy   Family history of uterine cancer    GERD (gastroesophageal reflux disease)    with certain foods/OTC PRN meds   Headache(784.0)    Hyperlipidemia    on meds   Hypertension    on meds   SVT (supraventricular tachycardia) ([place])      No Known Allergies    Current Outpatient Medications on File Prior to Visit  Medication Sig Dispense Refill   albuterol (VENTOLIN HFA) 108 (90 Base) MCG/ACT inhaler Inhale 2 puffs into the lungs every 6 (six) hours as needed for wheezing or shortness of breath. 18 g 2   Cholecalciferol (VITAMIN D3) 2000 units capsule Take 2,000 Units by mouth daily.  11   dexamethasone (DECADRON) 4 MG tablet Take 4 mg by mouth as needed. Given with chemo treatment.     escitalopram (LEXAPRO) 20 MG tablet Take 1 tablet (20 mg total) by mouth daily. 90 tablet 1   fluticasone (FLONASE) 50 MCG/ACT nasal spray Use 2 sprays in each nostril daily. 16 g 11   gabapentin (NEURONTIN) 300 MG capsule Take 1 capsule by mouth daily and 2 capsules at bedtime 90 capsule 2   montelukast (SINGULAIR) 10 MG tablet Take 1 tablet (10 mg  total) by mouth at bedtime. 90 tablet 3   oxyCODONE (OXY IR/ROXICODONE) 5 MG immediate release tablet Take 1 tablet (5 mg total) by mouth every 6 (six) hours as needed for moderate pain, severe pain or breakthrough pain. 30 tablet 0   prochlorperazine (COMPAZINE) 10 MG tablet Take 10 mg by mouth as needed.     promethazine (PHENERGAN) 25 MG suppository Place 25 mg rectally as needed.     propranolol (INDERAL) 10 MG tablet Take 1 tablet (10 mg total) by mouth 2 (two) times daily. 180 tablet 3   amLODipine (NORVASC) 10 MG tablet Take 1 tablet (10 mg total) by mouth daily. (Patient not taking: Reported on 07/13/2021) 90 tablet 1   hydrochlorothiazide (HYDRODIURIL) 25 MG tablet Take 1 tablet (25 mg total) by mouth daily. (Patient not taking: Reported on 07/13/2021) 90 tablet 1   Current Facility-Administered Medications on File Prior to Visit  Medication Dose Route Frequency Provider Last Rate Last Admin   acetaminophen (TYLENOL) 500 MG tablet            ondansetron (ZOFRAN) 4 MG/2ML injection            ondansetron (ZOFRAN) 4 MG/2ML injection            sodium chloride flush (  NS) 0.9 % injection 10 mL  10 mL Intracatheter PRN Magrinat, Virgie Dad, MD   10 mL at 02/10/21 1716     Review of System: Comprehensive ROS Pertinent positive and negative noted in HPI    Objective:  BP (!) 154/103 (BP Location: Right Arm, Patient Position: Sitting, Cuff Size: Normal)    Pulse 70    Temp 97.7 F (36.5 C) (Oral)    Ht 5\' 11"  (1.803 m)    Wt 228 lb 3.2 oz (103.5 kg)    SpO2 99%    BMI 31.83 kg/m   Filed Weights   07/13/21 1512  Weight: 228 lb 3.2 oz (103.5 kg)   Physical Exam: General Appearance: Well nourished, in no apparent distress. Eyes: PERRLA, EOMs, conjunctiva no swelling or erythema Sinuses: No Frontal/maxillary tenderness ENT/Mouth: Ext aud canals clear, TMs without erythema, bulging. No erythema, swelling, or exudate on post pharynx.  Tonsils not swollen or erythematous. Hearing normal.   Neck: Supple, thyroid normal.  Respiratory: Respiratory effort normal, BS equal bilaterally without rales, rhonchi, wheezing or stridor.  Cardio: RRR with no MRGs. Brisk peripheral pulses without edema.  Abdomen: Soft, + BS.  Non tender, no guarding, rebound, hernias, masses. Lymphatics: Non tender without lymphadenopathy.  Musculoskeletal: Full ROM, 5/5 strength, normal gait.  Skin: Warm, dry without rashes, lesions, ecchymosis.  Neuro: Cranial nerves intact. Normal muscle tone, no cerebellar symptoms. Sensation intact.  Psych: Awake and oriented X 3, normal affect, Insight and Judgment appropriate.    Assessment:  Brigett was seen today for asthma.  Diagnoses and all orders for this visit:  Essential hypertension, benign Counseled on blood pressure goal of less than 130/80, low-sodium, DASH diet, medication compliance, 150 minutes of moderate intensity exercise per week. Discussed medication compliance, adverse effects.  -     hydrochlorothiazide (HYDRODIURIL) 25 MG tablet; Take 1 tablet by mouth daily. -     amLODipine (NORVASC) 10 MG tablet; Take 1 tablet  by mouth daily.    This note has been created with Surveyor, quantity. Any transcriptional errors are unintentional.   [name], NP 07/13/2021, 3:45 PM

## 2021-07-13 NOTE — Progress Notes (Signed)
Pt was recently in [place] and was having an asthma attack pt called PCP office for advise and was told to go to emergency room in [place]

## 2021-07-15 ENCOUNTER — Encounter: Payer: Self-pay | Admitting: *Deleted

## 2021-07-15 LAB — SURGICAL PATHOLOGY

## 2021-07-16 ENCOUNTER — Other Ambulatory Visit: Payer: Self-pay

## 2021-07-16 ENCOUNTER — Ambulatory Visit (INDEPENDENT_AMBULATORY_CARE_PROVIDER_SITE_OTHER): Payer: Medicaid Other | Admitting: Clinical

## 2021-07-16 DIAGNOSIS — F333 Major depressive disorder, recurrent, severe with psychotic symptoms: Secondary | ICD-10-CM

## 2021-07-17 ENCOUNTER — Encounter: Payer: Self-pay | Admitting: Oncology

## 2021-07-17 ENCOUNTER — Other Ambulatory Visit (HOSPITAL_COMMUNITY): Payer: Self-pay

## 2021-07-17 MED ORDER — ARIPIPRAZOLE 10 MG PO TABS
ORAL_TABLET | ORAL | 0 refills | Status: DC
  Filled 2021-07-17: qty 30, 30d supply, fill #0

## 2021-07-18 NOTE — Progress Notes (Signed)
Patient Care Team: [name], NP as PCP - General (Internal Medicine) [name], DO as PCP - Cardiology (Cardiology) [name], RN as Oncology Nurse Navigator [name], RN as Oncology Nurse Navigator [name], MD as Consulting Physician (Radiation Oncology) [name], MD as Consulting Physician (General Surgery) [name], MD as Consulting Physician (Hematology and Oncology)  DIAGNOSIS:    ICD-10-CM   1. Malignant neoplasm of upper-outer quadrant of right breast in female, estrogen receptor positive ([place])  C50.411    Z17.0       SUMMARY OF ONCOLOGIC HISTORY: Oncology History  Malignant neoplasm of upper-outer quadrant of right breast in female, estrogen receptor positive ([place])  12/12/2020 Initial Diagnosis   status post bilateral breast biopsies 12/03/2020, showing             (1) on the right, a clinical T2 N1, stage IIa invasive lobular carcinoma, grade 2, estrogen and progesterone receptor strongly positive, HER2 not amplified, with an MIB-1 of 40%                         (a) the biopsied right axillary lymph node was positive with extracapsular extension                         (b) a second right breast mass also biopsied was a fibroadenoma, concordant  MAMMAPRINT tested on biopsy returned high risk, luminal type B, indicating significant benefit from chemo                         (c) biopsy of an area of non-mass-like enhancement in the upper right breast pending   12/17/2020 Cancer Staging   Staging form: Breast, AJCC 8th Edition - Clinical stage from 12/17/2020: cT2, cN1, cM0, ER+, PR+, HER2- - Signed by [name], MD on 12/17/2020 Stage prefix: Initial diagnosis Laterality: Right Staged by: Pathologist and managing physician Stage used in treatment planning: Yes National guidelines used in treatment planning: Yes Type of national guideline used in treatment planning: NCCN    12/24/2020 Genetic Testing   Negative  genetic testing:  No pathogenic variants detected on the [employer] BRCAplus panel (report date 12/24/2020) or the CancerNext-Expanded + RNAinsight panel (report date 12/31/2020). 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   The BRCAplus panel offered by [employer] and includes sequencing and deletion/duplication analysis for the following 8 genes: ATM, BRCA1, BRCA2, CDH1, CHEK2, PALB2, PTEN, and TP53. The CancerNext-Expanded + RNAinsight gene panel offered by [employer] and includes sequencing and rearrangement analysis for the following 77 genes: AIP, ALK, APC, ATM, AXIN2, BAP1, BARD1, BLM, BMPR1A, BRCA1, BRCA2, BRIP1, CDC73, CDH1, CDK4, CDKN1B, CDKN2A, CHEK2, CTNNA1, DICER1, FANCC, FH, FLCN, GALNT12, KIF1B, LZTR1, MAX, MEN1, MET, MLH1, MSH2, MSH3, MSH6, MUTYH, NBN, NF1, NF2, NTHL1, PALB2, PHOX2B, PMS2, POT1, PRKAR1A, PTCH1, PTEN, RAD51C, RAD51D, RB1, RECQL, RET, SDHA, SDHAF2, SDHB, SDHC, SDHD, SMAD4, SMARCA4, SMARCB1, SMARCE1, STK11, SUFU, TMEM127, TP53, TSC1, TSC2, VHL and XRCC2 (sequencing and deletion/duplication); EGFR, EGLN1, HOXB13, KIT, MITF, PDGFRA, POLD1 and POLE (sequencing only); EPCAM and GREM1 (deletion/duplication only). RNA data is routinely analyzed for use in variant interpretation for all genes.   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 cm, margins negative, 0/1 lymph node negative ER 95%, PR 100%   07/2021 -  Radiation Therapy   Adjuvant radiation to follow surgery   09/2021 -  Anti-estrogen oral therapy   To follow  radiation therapy   Malignant neoplasm of upper-outer quadrant of left breast in female, estrogen receptor positive ([place])  12/12/2020 Initial Diagnosis   on the left, a clinical T1b N0, stage IA invasive ductal carcinoma, grade 1 or 2, estrogen and progesterone receptor positive, HER2 not amplified, with an MIB 1 of 15%   12/17/2020 Cancer Staging   Staging form: Breast, AJCC 8th Edition - Clinical stage from 12/17/2020: cT1b, cN0, cM0, ER+, PR+, HER2- - Signed by [name], MD on 12/17/2020 Stage prefix: Initial diagnosis Laterality: Left Staged by: Pathologist and managing physician Stage used in treatment planning: Yes National guidelines used in treatment planning: Yes Type of national guideline used in treatment planning: NCCN    12/24/2020 Genetic Testing   Negative genetic testing:  No pathogenic variants detected on the [facility] BRCAplus panel (report date 12/24/2020) or the CancerNext-Expanded + RNAinsight panel (report date 12/31/2020). 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   The BRCAplus panel offered by [employer] and includes sequencing and deletion/duplication analysis for the following 8 genes: ATM, BRCA1, BRCA2, CDH1, CHEK2, PALB2, PTEN, and TP53. The CancerNext-Expanded + RNAinsight gene panel offered by [employer] and includes sequencing and rearrangement analysis for the following 77 genes: AIP, ALK, APC, ATM, AXIN2, BAP1, BARD1, BLM, BMPR1A, BRCA1, BRCA2, BRIP1, CDC73, CDH1, CDK4, CDKN1B, CDKN2A, CHEK2, CTNNA1, DICER1, FANCC, FH, FLCN, GALNT12, KIF1B, LZTR1, MAX, MEN1, MET, MLH1, MSH2, MSH3, MSH6, MUTYH, NBN, NF1, NF2, NTHL1, PALB2, PHOX2B, PMS2, POT1, PRKAR1A, PTCH1, PTEN, RAD51C, RAD51D, RB1, RECQL, RET, SDHA, SDHAF2, SDHB, SDHC, SDHD, SMAD4, SMARCA4, SMARCB1, SMARCE1, STK11, SUFU, TMEM127, TP53, TSC1, TSC2, VHL and XRCC2 (sequencing and deletion/duplication); EGFR, EGLN1, HOXB13, KIT, MITF, PDGFRA, POLD1 and  POLE (sequencing only); EPCAM and GREM1 (deletion/duplication only). RNA data is routinely analyzed for use in variant interpretation for all genes.   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, margins negative, 0/1 lymph node negative ER 95%, PR 100%   07/2021 -  Radiation Therapy   Adjuvant radiation to follow surgery   09/2021 -  Anti-estrogen oral therapy   To follow radiation therapy     CHIEF COMPLIANT: Follow-up of breast cancer status post surgery and to establish oncology care  INTERVAL HISTORY: Chelsea Hansen is a 51 y.o. with above-mentioned history of breast cancer having undergone neoadjuvant chemotherapy. 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 She presents to the clinic today for follow-up.  She is recovering very well from recent surgery she still in some  pain.  ALLERGIES:  has No Known Allergies.  MEDICATIONS:  Current Outpatient Medications  Medication Sig Dispense Refill   albuterol (VENTOLIN HFA) 108 (90 Base) MCG/ACT inhaler Inhale 2 puffs into the lungs every 6 (six) hours as needed for wheezing or shortness of breath. 18 g 2   amLODipine (NORVASC) 10 MG tablet Take 1 tablet  by mouth daily. 90 tablet 1   ARIPiprazole (ABILIFY) 10 MG tablet take 1/2 tablet by mouth daily at bedtime for 4 days.  Then1 tablet bedtime as directed. 30 tablet 0    Cholecalciferol (VITAMIN D3) 2000 units capsule Take 2,000 Units by mouth daily.  11   dexamethasone (DECADRON) 4 MG tablet Take 4 mg by mouth as needed. Given with chemo treatment.     escitalopram (LEXAPRO) 20 MG tablet Take 1 tablet (20 mg total) by mouth daily. 90 tablet 1   fluticasone (FLONASE) 50 MCG/ACT nasal spray Use 2 sprays in each nostril daily. 16 g 11   gabapentin (NEURONTIN) 300 MG capsule Take 1 capsule by mouth daily and 2 capsules at bedtime 90 capsule 2   hydrochlorothiazide (HYDRODIURIL) 25 MG tablet Take 1 tablet by mouth daily. 90 tablet 1   montelukast (SINGULAIR) 10 MG tablet Take 1 tablet (10 mg total) by mouth at bedtime. 90 tablet 3   oxyCODONE (OXY IR/ROXICODONE) 5 MG immediate release tablet Take 1 tablet (5 mg total) by mouth every 6 (six) hours as needed for moderate pain, severe pain or breakthrough pain. 30 tablet 0   prochlorperazine (COMPAZINE) 10 MG tablet Take 10 mg by mouth as needed.     promethazine (PHENERGAN) 25 MG suppository Place 25 mg rectally as needed.     propranolol (INDERAL) 10 MG tablet Take 1 tablet (10 mg total) by mouth 2 (two) times daily. 180 tablet 3   No current facility-administered medications for this visit.   Facility-Administered Medications Ordered in Other Visits  Medication Dose Route Frequency Provider Last Rate Last Admin   acetaminophen (TYLENOL) 500 MG tablet            ondansetron (ZOFRAN) 4 MG/2ML injection            ondansetron (ZOFRAN) 4 MG/2ML injection            sodium chloride flush (NS) 0.9 % injection 10 mL  10 mL Intracatheter PRN Magrinat, Virgie Dad, MD   10 mL at 02/10/21 1716    PHYSICAL EXAMINATION: ECOG PERFORMANCE STATUS: 1 - Symptomatic but completely ambulatory  Vitals:   07/20/21 1454  BP: 136/89  Pulse: 95  Resp: 18  Temp: 97.9 F (36.6 C)  SpO2: 100%   Filed Weights   07/20/21 1454  Weight: 222 lb 4 oz (100.8 kg)     LABORATORY DATA:  I have reviewed the data as listed CMP Latest Ref  Rng & Units 06/08/2021 06/02/2021 05/26/2021  Glucose 70 - 99 mg/dL 124(H) 98 128(H)  BUN 6 - 20 mg/dL _0 Creatinine 0.44 - 1.00 mg/dL 0.75 0.79 0.82  Sodium 135 - 145 mmol/L 140 140 139  Potassium 3.5 - 5.1 mmol/L 3.6 3.8 3.6  Chloride 98 - 111 mmol/L 107 106 105  CO2 22 - 32 mmol/L _1 Calcium 8.9 - 10.3 mg/dL 8.9 9.2 8.9  Total Protein 6.5 - 8.1 g/dL 6.8 7.2 6.6  Total Bilirubin 0.3 - 1.2 mg/dL 0.2(L) 0.3 0.3  Alkaline Phos 38 - 126 U/L 84 94 91  AST 15 - 41  U/L _0 ALT 0 - 44 U/L _1 Lab Results  Component Value Date   WBC 3.6 (L) 06/08/2021   HGB 8.8 (L) 06/08/2021   HCT 27.9 (L) 06/08/2021   MCV 81.3 06/08/2021   PLT 374 06/08/2021   NEUTROABS 2.1 06/08/2021    ASSESSMENT & PLAN:  Malignant neoplasm of upper-outer quadrant of right breast in female, estrogen receptor positive ([place]) 12/03/2020: Bilateral breast biopsies: Right breast: T2N1 stage IIa grade 2 ILC, ER/PR positive, HER2 negative, Ki-67 40%, right axillary lymph node positive with extracapsular extension MammaPrint: High risk  01/27/2021 -06/08/2021 neoadjuvant chemotherapy with dose dense Adriamycin and Cytoxan x4 followed by Taxol x11  07/09/2021:Right lumpectomy: Grade 2 invasive lobular cancer, 2.5 cm, LCIS, margins negative, LCIS focally at anterior margin, 1/1 lymph node positive, ER 95%, PR 100%, HER2 negative, Ki-67 40% Left lumpectomy: High-grade DCIS: 0.7 cm, margins negative, 0/1 lymph node negative ER 95%, PR 100%  Pathology counseling: I discussed the final pathology report of the patient provided  a copy of this report. I discussed the margins as well as lymph node surgeries. We also discussed the final staging along with previously performed ER/PR and HER-2/neu testing.  Treatment plan: 1.  Adjuvant radiation therapy 2. followed by adjuvant antiestrogen therapy  Return to clinic after radiation to start antiestrogen    No orders of the defined types were placed in  this encounter.  The patient has a good understanding of the overall plan. she agrees with it. she will call with any problems that may develop before the next visit here.  Total time spent: 20 mins including face to face time and time spent for planning, charting and coordination of care  Rulon Eisenmenger, MD, MPH 07/20/2021  I, Thana Ates, am acting as scribe for Dr. [name].  I have reviewed the above documentation for accuracy and completeness, and I agree with the above.

## 2021-07-20 ENCOUNTER — Other Ambulatory Visit (HOSPITAL_COMMUNITY): Payer: Self-pay

## 2021-07-20 ENCOUNTER — Other Ambulatory Visit: Payer: Self-pay

## 2021-07-20 ENCOUNTER — Inpatient Hospital Stay: Payer: Medicaid Other | Attending: Hematology and Oncology | Admitting: Hematology and Oncology

## 2021-07-20 DIAGNOSIS — Z170 Estrogen receptor positive status [ER+]: Secondary | ICD-10-CM | POA: Diagnosis not present

## 2021-07-20 DIAGNOSIS — Z9221 Personal history of antineoplastic chemotherapy: Secondary | ICD-10-CM | POA: Diagnosis not present

## 2021-07-20 DIAGNOSIS — C50411 Malignant neoplasm of upper-outer quadrant of right female breast: Secondary | ICD-10-CM | POA: Insufficient documentation

## 2021-07-20 DIAGNOSIS — C50412 Malignant neoplasm of upper-outer quadrant of left female breast: Secondary | ICD-10-CM | POA: Insufficient documentation

## 2021-07-20 DIAGNOSIS — Z923 Personal history of irradiation: Secondary | ICD-10-CM | POA: Insufficient documentation

## 2021-07-20 NOTE — Assessment & Plan Note (Signed)
12/03/2020: Bilateral breast biopsies: Right breast: T2N1 stage IIa grade 2 ILC, ER/PR positive, HER2 negative, Ki-67 40%, right axillary lymph node positive with extracapsular extension MammaPrint: High risk  01/27/2021 -06/08/2021 neoadjuvant chemotherapy with dose dense Adriamycin and Cytoxan x4 followed by Taxol x11  07/09/2021:Right lumpectomy: Grade 2 invasive lobular cancer, 2.5 cm, LCIS, margins negative, LCIS focally at anterior margin, 1/1 lymph node positive, ER 95%, PR 100%, HER2 negative, Ki-67 40% Left lumpectomy: High-grade DCIS: 0.7 cm, margins negative, 0/1 lymph node negative ER 95%, PR 100%  Pathology counseling: I discussed the final pathology report of the patient provided  a copy of this report. I discussed the margins as well as lymph node surgeries. We also discussed the final staging along with previously performed ER/PR and HER-2/neu testing.  Treatment plan: 1.  Adjuvant radiation therapy 2. followed by adjuvant antiestrogen therapy  Return to clinic after radiation to start antiestrogen

## 2021-07-22 ENCOUNTER — Other Ambulatory Visit: Payer: Self-pay | Admitting: *Deleted

## 2021-07-22 ENCOUNTER — Encounter: Payer: Self-pay | Admitting: Oncology

## 2021-07-22 ENCOUNTER — Encounter: Payer: Self-pay | Admitting: *Deleted

## 2021-07-22 DIAGNOSIS — C50411 Malignant neoplasm of upper-outer quadrant of right female breast: Secondary | ICD-10-CM

## 2021-07-22 NOTE — BH Specialist Note (Signed)
Integrated Behavioral Health Follow Up In-Person Visit  MRN: [id number] Name: Chelsea Hansen  Number of [facility] Clinician visits: 6/6 Session Start time: 1:40pm  Session End time: 2:10pm Total time: 30 minutes  Types of Service: Individual psychotherapy  Interpretor:No. Interpretor Name and Language: N/A  Subjective: Chelsea Hansen is a 51 y.o. female accompanied by  self Patient was referred by Juluis Mire, NP for depression and anxiety. Patient reports the following symptoms/concerns: Reports feeling overwhelmed with helping her family. Reports that she has to go back to [place] to help her family care for her grandmother. Reports that she also has noticed improvements in her relationship but it still bothers her that he uses alcohol daily. Pt reports that she has stopped Duration of problem: 1+ year; Severity of problem: severe  Objective: Mood: Anxious and Depressed and Affect: Appropriate Risk of harm to self or others: No plan to harm self or others  Life Context: Family and Social: Pt receives support from family.  School/Work: Pt is not working as much due to health. Self-Care: Denies substance use. Pt was prescribed Lexapro from PCP but does not adhere to it due to it causing drowsiness. Pt has one drink of alcohol/week due to pain from chemotherapy. Pt has upcoming appt with [facility] for therapy.  Life Changes: Pt was diagnosed with cancer. Pt and her boyfriend recently broke up and they continue to live together.   Patient and/or Family's Strengths/Protective Factors: Concrete supports in place (healthy food, safe environments, etc.)  Goals Addressed: Patient will:  Reduce symptoms of: anxiety and depression   Increase knowledge and/or ability of: coping skills   Demonstrate ability to: Increase healthy adjustment to current life circumstances  Progress towards Goals: Ongoing  Interventions: Interventions utilized:  Mindfulness or  Psychologist, educational, CBT Cognitive Behavioral Therapy, and Supportive Counseling Standardized Assessments completed: Not Needed  Patient and/or Family Response: Pt receptive to tx. Pt receptive to continuing relaxation training and establishing healthy boundaries with family.   Patient Centered Plan: Patient is on the following Treatment Plan(s): Depression and anxiety  Assessment: Denies SI/HI. Pt has hx of auditory hallucinations. Patient currently experiencing stress related to her health and problems with family. Pt is improving her relaxation and boundaries with family.  Patient may benefit from continuing to establish healthy boundaries with family. LCSWA encouraged pt to utilize deep breathing exercises and grounding techniques. LCSWA encouraged pt to continue establishing healthy boundaries with family.  Plan: Follow up with behavioral health clinician on : 07/30/21 Behavioral recommendations: Utilize deep breathing exercises and grounding techniques. Continue establishing healthy boundaries with family. Referral(s): [facility] (In Clinic) "From scale of 1-10, how likely are you to follow plan?": 10  Joycelynn Fritsche C Oreste Majeed, LCSW

## 2021-07-27 ENCOUNTER — Other Ambulatory Visit: Payer: Self-pay

## 2021-07-27 ENCOUNTER — Ambulatory Visit (INDEPENDENT_AMBULATORY_CARE_PROVIDER_SITE_OTHER): Payer: Medicaid Other | Admitting: Primary Care

## 2021-07-27 ENCOUNTER — Encounter: Payer: Self-pay | Admitting: Oncology

## 2021-07-27 ENCOUNTER — Encounter (INDEPENDENT_AMBULATORY_CARE_PROVIDER_SITE_OTHER): Payer: Self-pay | Admitting: Primary Care

## 2021-07-27 ENCOUNTER — Other Ambulatory Visit (HOSPITAL_COMMUNITY): Payer: Self-pay

## 2021-07-27 VITALS — BP 151/100 | HR 64 | Temp 97.9°F | Ht 71.0 in | Wt 222.8 lb

## 2021-07-27 DIAGNOSIS — I10 Essential (primary) hypertension: Secondary | ICD-10-CM

## 2021-07-27 MED ORDER — VALSARTAN 40 MG PO TABS
40.0000 mg | ORAL_TABLET | Freq: Every day | ORAL | 3 refills | Status: DC
  Filled 2021-07-27 – 2021-08-28 (×2): qty 90, 90d supply, fill #0
  Filled 2021-10-27: qty 90, 90d supply, fill #1

## 2021-07-27 NOTE — Progress Notes (Signed)
[place], is a 51 y.o. female  ZYS:[id number]  XNA:[id number]  DOB - Dec 25, 1970  Chief Complaint  Patient presents with   Blood Pressure Check       Subjective:   Chelsea Hansen is a 51 y.o. female here today for a follow up visit for HTN. Patient has No headache, No chest pain, No abdominal pain - No Nausea, No new weakness tingling or numbness, No Cough - shortness of breath .  No problems updated.  ALLERGIES: No Known Allergies  PAST MEDICAL HISTORY: Past Medical History:  Diagnosis Date   Allergy    seasonal allergies   Anxiety    on meds   Asthma    uses inhaler   Breast cancer ([place]) 2012   RIGHT lumpectomy   Cancer ([place]) 2022   RIGHT breast lump-dx 2022   Depression    on meds   DVT (deep venous thrombosis) ([place]) 2010   after hysterectomy   Family history of uterine cancer    GERD (gastroesophageal reflux disease)    with certain foods/OTC PRN meds   Headache(784.0)    Hyperlipidemia    on meds   Hypertension    on meds   SVT (supraventricular tachycardia) ([place])     MEDICATIONS AT HOME: Prior to Admission medications   Medication Sig Start Date End Date Taking? Authorizing Provider  albuterol (VENTOLIN HFA) 108 (90 Base) MCG/ACT inhaler Inhale 2 puffs into the lungs every 6 (six) hours as needed for wheezing or shortness of breath. 10/06/20  Yes [name], NP  amLODipine (NORVASC) 10 MG tablet Take 1 tablet  by mouth daily. 07/13/21  Yes [name], NP  ARIPiprazole (ABILIFY) 10 MG tablet Take 1/2 tablet by mouth daily at bedtime for 4 days, then take 1 tablet at bedtime as directed. 07/17/21  Yes   Cholecalciferol (VITAMIN D3) 2000 units capsule Take 2,000 Units by mouth daily. 11/28/16  Yes [provider]  dexamethasone (DECADRON) 4 MG tablet Take 4 mg by mouth as needed. Given with chemo treatment. 01/23/21  Yes [provider]  escitalopram (LEXAPRO) 20 MG tablet Take 1 tablet (20 mg  total) by mouth daily. 02/20/21  Yes [name], NP  fluticasone (FLONASE) 50 MCG/ACT nasal spray Use 2 sprays in each nostril daily. 02/06/21  Yes   gabapentin (NEURONTIN) 300 MG capsule Take 1 capsule by mouth daily and 2 capsules at bedtime 06/03/21  Yes Lateef, Carlus Pavlov, MD  hydrochlorothiazide (HYDRODIURIL) 25 MG tablet Take 1 tablet by mouth daily. 07/13/21  Yes [name], NP  montelukast (SINGULAIR) 10 MG tablet Take 1 tablet (10 mg total) by mouth at bedtime. 02/20/21  Yes [name], NP  oxyCODONE (OXY IR/ROXICODONE) 5 MG immediate release tablet Take 1 tablet (5 mg total) by mouth every 6 (six) hours as needed for moderate pain, severe pain or breakthrough pain. 07/09/21  Yes [name], MD  prochlorperazine (COMPAZINE) 10 MG tablet Take 10 mg by mouth as needed. 01/23/21  Yes [provider]  promethazine (PHENERGAN) 25 MG suppository Place 25 mg rectally as needed. 01/28/21  Yes [provider]  propranolol (INDERAL) 10 MG tablet Take 1 tablet (10 mg total) by mouth 2 (two) times daily. 04/28/21  Yes [name], [name], DO    Objective:  There were no vitals filed for this visit. Exam General appearance : Awake, alert, not in any distress. Speech Clear. Not toxic looking HEENT: Atraumatic and Normocephalic, pupils equally  reactive to light and accomodation Neck: Supple, no JVD. No cervical lymphadenopathy.  Chest: Good air entry bilaterally, no added sounds  CVS: S1 S2 regular, no murmurs.  Abdomen: Bowel sounds present, Non tender and not distended with no gaurding, rigidity or rebound. Extremities: B/L Lower Ext shows no edema, both legs are warm to touch Neurology: Awake alert, and oriented X 3, CN II-XII intact, Non focal Skin: No Rash  Data Review No results found for: HGBA1C  Assessment & Plan  Chelsea Hansen was seen today for blood pressure check.  Diagnoses and all orders for this visit:  Essential hypertension, benign Counseled on blood  pressure goal of less than 130/80, low-sodium, DASH diet, medication compliance, 150 minutes of moderate intensity exercise per week. Discussed medication compliance, adverse effects.  -     valsartan (DIOVAN) 40 MG tablet; Take 1 tablet (40 mg total) by mouth daily.   Patient have been counseled extensively about nutrition and exercise. Other issues discussed during this visit include: low cholesterol diet, weight control and daily exercise, foot care, annual eye examinations at Ophthalmology, importance of adherence with medications and regular follow-up. We also discussed long term complications of uncontrolled diabetes and hypertension.   No follow-ups on file.  The patient was given clear instructions to go to ER or return to medical center if symptoms don't improve, worsen or new problems develop. The patient verbalized understanding. The patient was told to call to get lab results if they haven't heard anything in the next week.   This note has been created with Surveyor, quantity. Any transcriptional errors are unintentional.   [name], NP 07/27/2021, 3:28 PM

## 2021-07-28 ENCOUNTER — Other Ambulatory Visit (HOSPITAL_COMMUNITY): Payer: Self-pay

## 2021-07-29 ENCOUNTER — Other Ambulatory Visit (HOSPITAL_COMMUNITY): Payer: Self-pay

## 2021-07-30 ENCOUNTER — Encounter: Payer: Self-pay | Admitting: Oncology

## 2021-07-30 ENCOUNTER — Encounter: Payer: Self-pay | Admitting: Student in an Organized Health Care Education/Training Program

## 2021-07-30 ENCOUNTER — Encounter: Payer: Self-pay | Admitting: *Deleted

## 2021-07-30 ENCOUNTER — Ambulatory Visit (INDEPENDENT_AMBULATORY_CARE_PROVIDER_SITE_OTHER): Payer: Medicaid Other | Admitting: Clinical

## 2021-07-30 ENCOUNTER — Other Ambulatory Visit: Payer: Self-pay

## 2021-07-30 ENCOUNTER — Other Ambulatory Visit (HOSPITAL_COMMUNITY): Payer: Self-pay

## 2021-07-30 ENCOUNTER — Ambulatory Visit
Payer: Medicaid Other | Attending: Student in an Organized Health Care Education/Training Program | Admitting: Student in an Organized Health Care Education/Training Program

## 2021-07-30 VITALS — BP 161/94 | HR 71 | Temp 97.4°F | Resp 20 | Ht 71.0 in | Wt 220.0 lb

## 2021-07-30 DIAGNOSIS — C50411 Malignant neoplasm of upper-outer quadrant of right female breast: Secondary | ICD-10-CM

## 2021-07-30 DIAGNOSIS — G894 Chronic pain syndrome: Secondary | ICD-10-CM

## 2021-07-30 DIAGNOSIS — E669 Obesity, unspecified: Secondary | ICD-10-CM | POA: Diagnosis present

## 2021-07-30 DIAGNOSIS — F319 Bipolar disorder, unspecified: Secondary | ICD-10-CM

## 2021-07-30 DIAGNOSIS — C50412 Malignant neoplasm of upper-outer quadrant of left female breast: Secondary | ICD-10-CM | POA: Diagnosis present

## 2021-07-30 DIAGNOSIS — Z0289 Encounter for other administrative examinations: Secondary | ICD-10-CM

## 2021-07-30 DIAGNOSIS — Z170 Estrogen receptor positive status [ER+]: Secondary | ICD-10-CM

## 2021-07-30 MED ORDER — OXYCODONE-ACETAMINOPHEN 5-325 MG PO TABS
1.0000 | ORAL_TABLET | Freq: Three times a day (TID) | ORAL | 0 refills | Status: AC | PRN
Start: 2021-07-30 — End: 2021-08-29
  Filled 2021-07-30: qty 90, 30d supply, fill #0

## 2021-07-30 MED ORDER — OXYCODONE-ACETAMINOPHEN 5-325 MG PO TABS
1.0000 | ORAL_TABLET | Freq: Three times a day (TID) | ORAL | 0 refills | Status: AC | PRN
  Filled 2021-08-29: qty 90, 30d supply, fill #0

## 2021-07-30 NOTE — Progress Notes (Signed)
PROVIDER NOTE: Information contained herein reflects review and annotations entered in association with encounter. Interpretation of such information and data should be left to medically-trained personnel. Information provided to patient can be located elsewhere in the medical record under "Patient Instructions". Document created using STT-dictation technology, any transcriptional errors that may result from process are unintentional.    Patient: Chelsea Hansen  Service Category: E/M  Provider: Gillis Santa, MD  DOB: 1970-11-02  DOS: 07/30/2021  Specialty: Interventional Pain Management  MRN: [id number]  Setting: Ambulatory outpatient  PCP: [name], NP  Type: Established Patient    Referring Provider: [name], NP  Location: Office  Delivery: Face-to-face     HPI  [name], a 51 y.o. year old female, is here today because of her Chronic pain syndrome [G89.4]. Chelsea Hansen primary complain today is Pain Last encounter: My last encounter with her was on 06/03/2021. Pertinent problems: Chelsea Hansen has Malignant neoplasm of upper-outer quadrant of right breast in female, estrogen receptor positive ([place]); Malignant neoplasm of upper-outer quadrant of left breast in female, estrogen receptor positive ([place]); Obesity (BMI 30-39.9); Pain management contract signed; and Chronic pain syndrome (breast cancer) on their pertinent problem list. Pain Assessment: Severity of Chronic pain is reported as a 5 /10. Location: Breast Right, Left/Surgical pain from breast lump removal. Onset: More than a month ago. Quality: Constant, Sore. Timing: Constant. Modifying factor(s): Oxycodone and heating pad. Vitals:  height is '5\' 11"'  (1.803 m) and weight is 220 lb (99.8 kg). Her temporal temperature is 97.4 F (36.3 C) (abnormal). Her blood pressure is 161/94 (abnormal) and her pulse is 71. Her respiration is 20 and oxygen saturation is 100%.   Reason for encounter: medication management.    -Patient is  status post bilateral breast lumpectomy for breast cancer done on 07/09/2021.  She is recovering from her surgery.  She was prescribed oxycodone by her surgical team for postoperative pain 5 mg every 6 hours as needed.  She states that she obtained better pain relief with this as compared to hydrocodone.  She is requesting to transition to oxycodone to help manage her chronic pain as well as her acute pain.  Will transition to Percocet 5 mg every 8 hours as needed  Pharmacotherapy Assessment  Analgesic:  Percocet 5 mg every 8 hours as needed  Monitoring: [place] PMP: PDMP reviewed during this encounter.       Pharmacotherapy: No side-effects or adverse reactions reported. Compliance: No problems identified. Effectiveness: Clinically acceptable.  Al Decant, RN  07/30/2021  2:14 PM  Sign when Signing Visit Safety precautions to be maintained throughout the outpatient stay will include: orient to surroundings, keep bed in low position, maintain call bell within reach at all times, provide assistance with transfer out of bed and ambulation.   Nursing Pain Medication Assessment:  Safety precautions to be maintained throughout the outpatient stay will include: orient to surroundings, keep bed in low position, maintain call bell within reach at all times, provide assistance with transfer out of bed and ambulation.  Medication Inspection Compliance: Pill count conducted under aseptic conditions, in front of the patient. Neither the pills nor the bottle was removed from the patient's sight at any time. Once count was completed pills were immediately returned to the patient in their original bottle.  Medication: Hydrocodone/APAP Pill/Patch Count:  6 of 90 pills remain Pill/Patch Appearance: Markings consistent with prescribed medication Bottle Appearance: Standard pharmacy container. Clearly labeled. Filled Date: 1 / 34 /  2023 Last Medication intake:  Today   UDS:  Summary  Date Value Ref Range Status   04/08/2021 Note  Final    Comment:    ==================================================================== Compliance Drug Analysis, Ur ==================================================================== Test                             Result       Flag       Units  Drug Present not Declared for Prescription Verification   Diphenhydramine                PRESENT      UNEXPECTED  Drug Absent but Declared for Prescription Verification   Gabapentin                     Not Detected UNEXPECTED   Citalopram                     Not Detected UNEXPECTED   Acetaminophen                  Not Detected UNEXPECTED    Acetaminophen, as indicated in the declared medication list, is not    always detected even when used as directed.  ==================================================================== Test                      Result    Flag   Units      Ref Range   Creatinine              133              mg/dL      >=20 ==================================================================== Declared Medications:  The flagging and interpretation on this report are based on the  following declared medications.  Unexpected results may arise from  inaccuracies in the declared medications.   **Note: The testing scope of this panel includes these medications:   Escitalopram (Lexapro)  Gabapentin (Neurontin)   **Note: The testing scope of this panel does not include small to  moderate amounts of these reported medications:   Acetaminophen (Tylenol)   **Note: The testing scope of this panel does not include the  following reported medications:   Albuterol (Ventolin HFA)  Amlodipine (Norvasc)  Fluticasone (Flonase)  Hydrochlorothiazide  Loratadine (Claritin)  Montelukast (Singulair)  Multivitamin  Omeprazole (Prilosec)  Vitamin D3 ==================================================================== For clinical consultation, please call [phone]. ====================================================================      ROS  Constitutional: Denies any fever or chills Gastrointestinal: No reported hemesis, hematochezia, vomiting, or acute GI distress Musculoskeletal: Denies any acute onset joint swelling, redness, loss of ROM, or weakness Neurological: No reported episodes of acute onset apraxia, aphasia, dysarthria, agnosia, amnesia, paralysis, loss of coordination, or loss of consciousness  Medication Review  ARIPiprazole, Vitamin D3, albuterol, amLODipine, dexamethasone, escitalopram, fluticasone, gabapentin, hydrochlorothiazide, montelukast, oxyCODONE-acetaminophen, prochlorperazine, promethazine, propranolol, and valsartan  History Review  Allergy: Chelsea Hansen has No Known Allergies. Drug: Chelsea Hansen  reports no history of drug use. Alcohol:  reports that she does not currently use alcohol after a past usage of about 7.0 standard drinks per week. Tobacco:  reports that she has never smoked. She has never used smokeless tobacco. Social: Chelsea Hansen  reports that she has never smoked. She has never used smokeless tobacco. She reports that she does not currently use alcohol after a past usage of about 7.0 standard drinks per week. She reports that she does not use  drugs. Medical:  has a past medical history of Allergy, Anxiety, Asthma, Breast cancer ([place]) (2012), Cancer ([place]) (2022), Depression, DVT (deep venous thrombosis) ([place]) (2010), Family history of uterine cancer, GERD (gastroesophageal reflux disease), Headache(784.0), Hyperlipidemia, Hypertension, and SVT (supraventricular tachycardia) ([place]). Surgical: Chelsea Hansen  has a past surgical history that includes Abdominal hysterectomy; Breast surgery; Tubal ligation; Breast excisional biopsy (Right, 09/16/2009); Portacath placement (Left, 01/26/2021); Breast lumpectomy with radioactive seed localization (Bilateral, 07/09/2021); Radioactive seed guided axillary sentinel lymph node (Right,  07/09/2021); Axillary sentinel node biopsy (Left, 07/09/2021); and Port-a-cath removal (Left, 07/09/2021). Family: family history includes Cancer in her maternal aunt, maternal aunt, and mother; Hypertension in her father; Uterine cancer (age of onset: 81) in her cousin.  Laboratory Chemistry Profile   Renal Lab Results  Component Value Date   BUN 13 06/08/2021   CREATININE 0.75 06/08/2021   BCR 15 10/06/2020   GFRAA >60 09/07/2019   GFRNONAA >60 06/08/2021    Hepatic Lab Results  Component Value Date   AST 16 06/08/2021   ALT 20 06/08/2021   ALBUMIN 3.6 06/08/2021   ALKPHOS 84 06/08/2021   LIPASE 21 01/28/2021    Electrolytes Lab Results  Component Value Date   NA 140 06/08/2021   K 3.6 06/08/2021   CL 107 06/08/2021   CALCIUM 8.9 06/08/2021   MG 2.0 01/28/2021    Bone No results found for: VD25OH, VD125OH2TOT, BZ1696VE9, FY1017PZ0, 25OHVITD1, 25OHVITD2, 25OHVITD3, TESTOFREE, TESTOSTERONE  Inflammation (CRP: Acute Phase) (ESR: Chronic Phase) Lab Results  Component Value Date   ESRSEDRATE 11 12/31/2011   LATICACIDVEN 0.87 11/01/2013         Note: Above Lab results reviewed.  Recent Imaging Review  [place] RT RADIOACTIVE SEED EA ADD LESION CLINICAL DATA:  Patient presents for preoperative radioactive seed localization of biopsy proven malignancy in the right breast at the 10 o'clock position and biopsy proven metastatic lymph node in the right axilla.  EXAM: ULTRASOUND GUIDED RADIOACTIVE SEED LOCALIZATION OF THE RIGHT BREAST  COMPARISON:  Previous exam(s).  FINDINGS: Patient presents for radioactive seed localization prior to right breast lumpectomy. I met with the patient and we discussed the procedure of seed localization including benefits and alternatives. We discussed the high likelihood of a successful procedure. We discussed the risks of the procedure including infection, bleeding, tissue injury and further surgery. We discussed the low dose of radioactivity  involved in the procedure. Informed, written consent was given.  The usual time-out protocol was performed immediately prior to the procedure.  SITE 1: RIGHT BREAST 10 O'CLOCK: Using ultrasound guidance, sterile technique, 1% lidocaine and an I-125 radioactive seed, the mass in the right breast at the 10 o'clock position was localized using a lateral to medial approach. The follow-up mammogram images confirm the seed in the expected location and were marked for Dr. Ninfa Linden.  Follow-up survey of the patient confirms presence of the radioactive seed.  Order number of I-125 seed:  [id number].  Total activity:  4.235 millicuries reference Date: 04/20/2021  SITE 2: RIGHT AXILLA: Using ultrasound guidance, sterile technique, 1% lidocaine and an I-125 radioactive seed, the abnormal lymph node containing the tri bell biopsy marking clip was localized using a lateral to medial approach. The follow-up mammogram images confirm the seed in the expected location and were marked for Dr. Ninfa Linden.  Follow-up survey of the patient confirms presence of the radioactive seed.  Order number of I-125 seed:  [id number].  Total activity:  0.086 millicuries reference Date: 06/10/2021  The patient tolerated  the procedure well and was [facility]. She was given instructions regarding seed removal.  IMPRESSION: Radioactive seed localization right breast 10 o'clock and right axilla.  Electronically Signed   By: Everlean Alstrom M.D.   On: 07/09/2021 10:31 MM Breast Surgical Specimen CLINICAL DATA:  Evaluate 3 specimens  EXAM: SPECIMEN RADIOGRAPH OF THE BILATERAL BREAST  COMPARISON:  Previous exam(s).  FINDINGS: Status post excision of the bilateral breasts. For the right specimens, both the radioactive seeds and biopsy clips are in the specimen. On the left, the seed was presented in a specimen container.  IMPRESSION: Specimen radiograph of the bilateral  breasts.  Electronically Signed   By: Dorise Bullion III M.D.   On: 07/09/2021 10:24 MM Breast Surgical Specimen CLINICAL DATA:  Evaluate 3 specimens  EXAM: SPECIMEN RADIOGRAPH OF THE BILATERAL BREAST  COMPARISON:  Previous exam(s).  FINDINGS: Status post excision of the bilateral breasts. For the right specimens, both the radioactive seeds and biopsy clips are in the specimen. On the left, the seed was presented in a specimen container.  IMPRESSION: Specimen radiograph of the bilateral breasts.  Electronically Signed   By: Dorise Bullion III M.D.   On: 07/09/2021 10:24 MM Breast Surgical Specimen CLINICAL DATA:  Evaluate 3 specimens  EXAM: SPECIMEN RADIOGRAPH OF THE BILATERAL BREAST  COMPARISON:  Previous exam(s).  FINDINGS: Status post excision of the bilateral breasts. For the right specimens, both the radioactive seeds and biopsy clips are in the specimen. On the left, the seed was presented in a specimen container.  IMPRESSION: Specimen radiograph of the bilateral breasts.  Electronically Signed   By: Dorise Bullion III M.D.   On: 07/09/2021 10:24 Note: Reviewed        Physical Exam  General appearance: Well nourished, well developed, and well hydrated. In no apparent acute distress Mental status: Alert, oriented x 3 (person, place, & time)       Respiratory: No evidence of acute respiratory distress Eyes: PERLA Vitals: BP (!) 161/94    Pulse 71    Temp (!) 97.4 F (36.3 C) (Temporal)    Resp 20    Ht '5\' 11"'  (1.803 m)    Wt 220 lb (99.8 kg)    SpO2 100%    BMI 30.68 kg/m  BMI: Estimated body mass index is 30.68 kg/m as calculated from the following:   Height as of this encounter: '5\' 11"'  (1.803 m).   Weight as of this encounter: 220 lb (99.8 kg). Ideal: Ideal body weight: 70.8 kg (156 lb 1.4 oz) Adjusted ideal body weight: 82.4 kg (181 lb 10.4 oz)  Assessment   Status Diagnosis  Controlled Controlled Controlled 1. Chronic pain syndrome (breast  cancer)   2. Pain management contract signed   3. Malignant neoplasm of upper-outer quadrant of right breast in female, estrogen receptor positive ([place])   4. Malignant neoplasm of upper-outer quadrant of left breast in female, estrogen receptor positive ([place])   5. Obesity (BMI 30-39.9)      Plan of Care    Chelsea Hansen has a current medication list which includes the following long-term medication(s): albuterol, amlodipine, aripiprazole, escitalopram, fluticasone, gabapentin, hydrochlorothiazide, montelukast, propranolol, and valsartan.  Pharmacotherapy (Medications Ordered): Meds ordered this encounter  Medications   oxyCODONE-acetaminophen (PERCOCET) 5-325 MG tablet    Sig: Take 1 tablet by mouth every 8 (eight) hours as needed for severe pain. Must last 30 days.    Dispense:  90 tablet    Refill:  0    Chronic Pain: STOP Act (Not applicable) Fill 1 day early if closed on refill date. Avoid benzodiazepines within 8 hours of opioids   oxyCODONE-acetaminophen (PERCOCET) 5-325 MG tablet    Sig: Take 1 tablet by mouth every 8 (eight) hours as needed for severe pain. Must last 30 days.  **08/29/21    Dispense:  90 tablet    Refill:  0    Chronic Pain: STOP Act (Not applicable) Fill 1 day early if closed on refill date. Avoid benzodiazepines within 8 hours of opioids   Continue gabapentin 300 mg 3 times daily  Follow-up plan:   Return in about 8 weeks (around 09/24/2021) for Medication Management, in person.    Recent Visits Date Type Provider Dept  06/03/21 Office Visit Gillis Santa, MD [facility]  05/04/21 Office Visit Gillis Santa, MD [facility]  Showing recent visits within past 90 days and meeting all other requirements Today's Visits Date Type Provider Dept  07/30/21 Office Visit Gillis Santa, MD [facility]  Showing today's visits and meeting all other requirements Future Appointments No visits were found meeting these  conditions. Showing future appointments within next 90 days and meeting all other requirements  I discussed the assessment and treatment plan with the patient. The patient was provided an opportunity to ask questions and all were answered. The patient agreed with the plan and demonstrated an understanding of the instructions.  Patient advised to call back or seek an in-person evaluation if the symptoms or condition worsens.  Duration of encounter: 80mnutes.  Note by: BGillis Santa MD Date: 07/30/2021; Time: 2:36 PM

## 2021-07-30 NOTE — Progress Notes (Signed)
Safety precautions to be maintained throughout the outpatient stay will include: orient to surroundings, keep bed in low position, maintain call bell within reach at all times, provide assistance with transfer out of bed and ambulation.   Nursing Pain Medication Assessment:  Safety precautions to be maintained throughout the outpatient stay will include: orient to surroundings, keep bed in low position, maintain call bell within reach at all times, provide assistance with transfer out of bed and ambulation.  Medication Inspection Compliance: Pill count conducted under aseptic conditions, in front of the patient. Neither the pills nor the bottle was removed from the patient's sight at any time. Once count was completed pills were immediately returned to the patient in their original bottle.  Medication: Hydrocodone/APAP Pill/Patch Count:  6 of 90 pills remain Pill/Patch Appearance: Markings consistent with prescribed medication Bottle Appearance: Standard pharmacy container. Clearly labeled. Filled Date: 1 / 33 / 2023 Last Medication intake:  Today

## 2021-07-31 NOTE — BH Specialist Note (Signed)
Integrated Behavioral Health Follow Up In-Person Visit  MRN: [id number] Name: Chelsea Hansen  Number of [facility] Clinician visits: Additional Visit  Session Start time: 0945   Session End time: 1000  Total time in minutes: 15   Types of Service: Individual psychotherapy  Interpretor:No. Interpretor Name and Language: N/A  Subjective: Chelsea Hansen is a 51 y.o. female accompanied by  self Patient was referred by Juluis Mire, NP for depression and anxiety. Patient reports the following symptoms/concerns: Reports feeling irritable with her partner. Reports irritability with her family. Reports that she has to go back to [place] to help with her grandmother and is choosing to limit her interactions with the rest of her family. Reports having to control her anger. Reports that she also is frustrated with having to get radiation to continue assisting with cancer. Reports that she had appt with [facility] for psychiatry. Reports that she was diagnosed with bipolar disorder and prescribed Abilify.  Duration of problem: 1+ year; Severity of problem: moderate  Objective: Mood: Irritable and Affect: Appropriate Risk of harm to self or others: No plan to harm self or others  Life Context: Family and Social: Pt receives support from family.  School/Work: Pt is not working as much due to health. Self-Care: Denies substance use. Pt was prescribed Lexapro from PCP but does not adhere to it due to it causing drowsiness. Pt has one drink of alcohol/week. Pt prescribed Abilify from [facility]. Life Changes: Pt was diagnosed with cancer.   Patient and/or Family's Strengths/Protective Factors: Concrete supports in place (healthy food, safe environments, etc.)  Goals Addressed: Patient will:  Reduce symptoms of: agitation, anxiety, and depression   Increase knowledge and/or ability of: coping skills   Demonstrate ability to: Increase healthy adjustment to current life  circumstances  Progress towards Goals: Ongoing  Interventions: Interventions utilized:  Mindfulness or Psychologist, educational, CBT Cognitive Behavioral Therapy, and Supportive Counseling Standardized Assessments completed: Not Needed  Patient and/or Family Response: Pt receptive to establishing boundaries with family, deep breathing exercises, and grounding techniques.   Patient Centered Plan: Patient is on the following Treatment Plan(s): Mood and anxiety  Assessment: Denies SI/HI. Pt has hx of auditory hallucinations. Patient currently experiencing mood disturbances and stress related to physical health. Pt's cognitive processing appears to be improving and is aware when she is irritable.   Patient may benefit from continuing to establish healthy boundaries with family. LCSW encouraged pt to establish healthy boundaries with family. LCSW encouraged pt to utilize deep breathing exercises and grounding techniques.  Plan: Follow up with behavioral health clinician on : 08/27/21 Behavioral recommendations: Utilize deep breathing exercises and grounding techniques. Establish healthy boundaries with family. Continue adhering to medication.  Referral(s): [place] (In Clinic) "From scale of 1-10, how likely are you to follow plan?": 10  Arianny Pun C Calvert Charland, LCSW

## 2021-08-03 ENCOUNTER — Other Ambulatory Visit (HOSPITAL_COMMUNITY): Payer: Self-pay

## 2021-08-03 ENCOUNTER — Encounter: Payer: Self-pay | Admitting: Oncology

## 2021-08-03 MED ORDER — ARIPIPRAZOLE 15 MG PO TABS
ORAL_TABLET | ORAL | 0 refills | Status: DC
  Filled 2021-08-03 – 2021-08-28 (×2): qty 30, 30d supply, fill #0

## 2021-08-05 ENCOUNTER — Encounter: Payer: Self-pay | Admitting: *Deleted

## 2021-08-05 NOTE — Progress Notes (Signed)
Per pt request, RN mailed letter for work excuse to address on file while FMLA/ Disability team completes received paperwork.

## 2021-08-06 ENCOUNTER — Other Ambulatory Visit (HOSPITAL_COMMUNITY): Payer: Self-pay

## 2021-08-10 ENCOUNTER — Ambulatory Visit (INDEPENDENT_AMBULATORY_CARE_PROVIDER_SITE_OTHER): Payer: Medicaid Other | Admitting: Primary Care

## 2021-08-11 ENCOUNTER — Other Ambulatory Visit (HOSPITAL_COMMUNITY): Payer: Self-pay

## 2021-08-19 NOTE — Progress Notes (Signed)
Location of Breast Cancer: upper-outer quadrant of right breast and upper-outer quadrant of left breast ? ?Histology per Pathology Report:  ?A. 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 ?B. 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 ?C. BREAST, LEFT, LUMPECTOMY:  ?High-grade ductal carcinoma in situ, clinging and solid type with focal  ?necrosis and abundant luminal mucin, focally suspicious for  ?microinvasion  ?Tumor measures 0.7 x 0.6 x 0.6 cm  ?Margins free  ?DCIS 1.2 mm from the posterior margin and 0.9 mm from the anterior  ?margin  ?Microcalcifications present within the benign adenosis  ?Changes consistent prior biopsy  ? ?D. LYMPH NODE, LEFT AXILLARY, SENTINEL, EXCISION:  ?One benign lymph node, negative for carcinoma (0/1, snpN0)  ? ?Receptor Status:  ?The tumor cells are NEGATIVE for Her2 (1+). ?Estrogen Receptor: 95%, POSITIVE, STRONG STAINING INTENSITY ?Progesterone Receptor: 100%, POSITIVE, STRONG STAINING INTENSITY ?Proliferation Marker Ki67: 15% ? ?Did patient present with symptoms (if so, please note symptoms) or was this found on screening mammography?: she noted a knot in her right breast. ? ?Past/Anticipated interventions by surgeon, if any:  ?07/09/2021 Surgery  ?  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 ? ? ?Past/Anticipated interventions by medical oncology, if any: Dr Lindi Adie ?01/27/2021 -  Neo-Adjuvant Chemotherapy  ?  Neoadjuvant chemotherapy with Doxorubicin and Cyclophosphamide given x 4 beginning 01/27/2021 and completing on 03/10/2021 followed by weekly paclitaxel x 12 beginning 03/24/2021  ? ?Treatment plan: ?1.  Adjuvant radiation therapy ?2. followed by adjuvant antiestrogen therapy ?  ? ?Lymphedema issues, if any:  yes, right greater than left upper arm and axilla   ? ?Pain issues, if any:  yes, generalized pain rated 10/10 intermittent, stabbing, and aching ? ?SAFETY ISSUES: ?Prior radiation? no ?Pacemaker/ICD? no ?Possible current pregnancy?no, hysterectomy ?Is the patient on methotrexate? no ? ?Current Complaints / other details:  nothing of note ?   ?Vitals:  ? 08/26/21 1404  ?BP: (!) 185/111  ?Pulse: 76  ?Resp: 18  ?Temp: (!) 96.9 ?F (36.1 ?C)  ?TempSrc: Temporal  ?SpO2: 100%  ?Weight: 226 lb 6 oz (102.7 kg)  ?Height: 5' 11" (1.803 m)  ? ? ? ? ?

## 2021-08-21 ENCOUNTER — Encounter (INDEPENDENT_AMBULATORY_CARE_PROVIDER_SITE_OTHER): Payer: Self-pay | Admitting: Primary Care

## 2021-08-21 ENCOUNTER — Other Ambulatory Visit: Payer: Self-pay

## 2021-08-21 ENCOUNTER — Ambulatory Visit (INDEPENDENT_AMBULATORY_CARE_PROVIDER_SITE_OTHER): Payer: Medicaid Other | Admitting: Primary Care

## 2021-08-21 VITALS — BP 138/80 | HR 75 | Temp 97.8°F | Ht 71.0 in | Wt 225.2 lb

## 2021-08-21 DIAGNOSIS — I10 Essential (primary) hypertension: Secondary | ICD-10-CM

## 2021-08-21 NOTE — Progress Notes (Signed)
[name] is a 51 y.o. female presents for hypertension evaluation, Denies shortness of breath, headaches, chest pain or lower extremity edema, sudden onset, vision changes, unilateral weakness, dizziness, paresthesias   Patient reports adherence with medications.  Dietary habits include: low sodium diet Exercise habits include:yes Family / Social history: Mother , maternal grand mother    Past Medical History:  Diagnosis Date   Allergy    seasonal allergies   Anxiety    on meds   Asthma    uses inhaler   Breast cancer ([place]) 2012   RIGHT lumpectomy   Cancer ([place]) 2022   RIGHT breast lump-dx 2022   Depression    on meds   DVT (deep venous thrombosis) ([place]) 2010   after hysterectomy   Family history of uterine cancer    GERD (gastroesophageal reflux disease)    with certain foods/OTC PRN meds   Headache(784.0)    Hyperlipidemia    on meds   Hypertension    on meds   SVT (supraventricular tachycardia) ([place])    Past Surgical History:  Procedure Laterality Date   ABDOMINAL HYSTERECTOMY     AXILLARY SENTINEL NODE BIOPSY Left 07/09/2021   Procedure: LEFT AXILLARY SENTINEL NODE BIOPSY;  Surgeon: [name], MD;  Location: [facility];  Service: General;  Laterality: Left;   BREAST EXCISIONAL BIOPSY Right 09/16/2009   BREAST LUMPECTOMY WITH RADIOACTIVE SEED LOCALIZATION Bilateral 07/09/2021   Procedure: BILATERAL BREAST LUMPECTOMY WITH RADIOACTIVE SEED LOCALIZATION;  Surgeon: [name], MD;  Location: [facility];  Service: General;  Laterality: Bilateral;   BREAST SURGERY     lumpectomy   PORT-A-CATH REMOVAL Left 07/09/2021   Procedure: REMOVAL PORT-A-CATH;  Surgeon: [name], MD;  Location: [facility];  Service: General;  Laterality: Left;   PORTACATH PLACEMENT Left 01/26/2021   Procedure: INSERTION PORT-A-CATH;  Surgeon: [name], MD;  Location: [facility];  Service:  General;  Laterality: Left;   RADIOACTIVE SEED GUIDED AXILLARY SENTINEL LYMPH NODE Right 07/09/2021   Procedure: RADIOACTIVE SEED GUIDED RIGHT AXILLARY SENTINEL LYMPH NODE DISSECTION;  Surgeon: [name], MD;  Location: [facility];  Service: General;  Laterality: Right;   TUBAL LIGATION     No Known Allergies Current Outpatient Medications on File Prior to Visit  Medication Sig Dispense Refill   albuterol (VENTOLIN HFA) 108 (90 Base) MCG/ACT inhaler Inhale 2 puffs into the lungs every 6 (six) hours as needed for wheezing or shortness of breath. 18 g 2   amLODipine (NORVASC) 10 MG tablet Take 1 tablet  by mouth daily. 90 tablet 1   ARIPiprazole (ABILIFY) 10 MG tablet Take 1/2 tablet by mouth daily at bedtime for 4 days, then take 1 tablet at bedtime as directed. 30 tablet 0   ARIPiprazole (ABILIFY) 15 MG tablet Take 1 tablet by mouth at bedtime 30 tablet 0   Cholecalciferol (VITAMIN D3) 2000 units capsule Take 2,000 Units by mouth daily.  11   dexamethasone (DECADRON) 4 MG tablet Take 4 mg by mouth as needed. Given with chemo treatment.     escitalopram (LEXAPRO) 20 MG tablet Take 1 tablet (20 mg total) by mouth daily. 90 tablet 1   fluticasone (FLONASE) 50 MCG/ACT nasal spray Use 2 sprays in each nostril daily. 16 g 11   gabapentin (NEURONTIN) 300 MG capsule Take 1 capsule by mouth daily and 2 capsules at bedtime 90 capsule 2   hydrochlorothiazide (HYDRODIURIL) 25 MG tablet  Take 1 tablet by mouth daily. 90 tablet 1   montelukast (SINGULAIR) 10 MG tablet Take 1 tablet (10 mg total) by mouth at bedtime. 90 tablet 3   oxyCODONE-acetaminophen (PERCOCET) 5-325 MG tablet Take 1 tablet by mouth every 8 (eight) hours as needed for severe pain. Must last 30 days. 90 tablet 0   [START ON 08/29/2021] oxyCODONE-acetaminophen (PERCOCET) 5-325 MG tablet Take 1 tablet by mouth every 8 (eight) hours as needed for severe pain. Must last 30 days.  **08/29/21 90 tablet 0   prochlorperazine  (COMPAZINE) 10 MG tablet Take 10 mg by mouth as needed.     promethazine (PHENERGAN) 25 MG suppository Place 25 mg rectally as needed.     propranolol (INDERAL) 10 MG tablet Take 1 tablet (10 mg total) by mouth 2 (two) times daily. 180 tablet 3   valsartan (DIOVAN) 40 MG tablet Take 1 tablet (40 mg total) by mouth daily. 90 tablet 3   Current Facility-Administered Medications on File Prior to Visit  Medication Dose Route Frequency Provider Last Rate Last Admin   acetaminophen (TYLENOL) 500 MG tablet            ondansetron (ZOFRAN) 4 MG/2ML injection            ondansetron (ZOFRAN) 4 MG/2ML injection            sodium chloride flush (NS) 0.9 % injection 10 mL  10 mL Intracatheter PRN Magrinat, Virgie Dad, MD   10 mL at 02/10/21 1716   Social History   Socioeconomic History   Marital status: Single    Spouse name: Not on file   Number of children: Not on file   Years of education: Not on file   Highest education level: Not on file  Occupational History   Not on file  Tobacco Use   Smoking status: Never   Smokeless tobacco: Never  Vaping Use   Vaping Use: Never used  Substance and Sexual Activity   Alcohol use: Not Currently    Alcohol/week: 7.0 standard drinks    Types: 7 Standard drinks or equivalent per week    Comment: occassional   Drug use: No   Sexual activity: Not Currently  Other Topics Concern   Not on file  Social History Narrative   Not on file   Social Determinants of Health   Financial Resource Strain: High Risk   Difficulty of Paying Living Expenses: Hard  Food Insecurity: Food Insecurity Present   Worried About Running Out of Food in the Last Year: Sometimes true   Ran Out of Food in the Last Year: Sometimes true  Transportation Needs: No Transportation Needs   Lack of Transportation (Medical): No   Lack of Transportation (Non-Medical): No  Physical Activity: Not on file  Stress: Not on file  Social Connections: Not on file  Intimate Partner Violence:  Not on file   Family History  Problem Relation Age of Onset   Cancer Mother        ?, dx <50   Hypertension Father    Cancer Maternal Aunt        uterine vs ovarian, dx <50   Cancer Maternal Aunt        unknown type, dx <50   Uterine cancer Cousin 46   Colon polyps Neg Hx    Colon cancer Neg Hx    Esophageal cancer Neg Hx    Stomach cancer Neg Hx      OBJECTIVE:  Vitals:   08/21/21  2671 08/21/21 1018  BP: (!) 150/91 138/80  Pulse: 75   Temp: 97.8 F (36.6 C)   TempSrc: Oral   SpO2: 96%   Weight: 225 lb 3.2 oz (102.2 kg)   Height: 5\' 11"  (1.803 m)     Physical Exam Vitals reviewed.  Constitutional:      Appearance: She is obese.  HENT:     Head: Normocephalic.     Right Ear: Tympanic membrane and external ear normal.     Left Ear: Tympanic membrane and external ear normal.     Nose: Nose normal.  Eyes:     Extraocular Movements: Extraocular movements intact.     Pupils: Pupils are equal, round, and reactive to light.  Cardiovascular:     Rate and Rhythm: Normal rate and regular rhythm.  Pulmonary:     Effort: Pulmonary effort is normal.     Breath sounds: Normal breath sounds.  Abdominal:     General: Abdomen is flat. Bowel sounds are normal. There is distension.  Musculoskeletal:        General: Normal range of motion.     Cervical back: Normal range of motion.  Skin:    General: Skin is warm and dry.  Neurological:     Mental Status: She is alert and oriented to person, place, and time.  Psychiatric:        Mood and Affect: Mood normal.        Behavior: Behavior normal.        Thought Content: Thought content normal.        Judgment: Judgment normal.    ROS Comprehensive ROS Pertinent positive and negative noted in HPI   Last 3 Office BP readings: BP Readings from Last 3 Encounters:  08/21/21 138/80  07/30/21 (!) 161/94  07/27/21 (!) 151/100    BMET    Component Value Date/Time   NA 140 06/08/2021 0817   NA 139 10/06/2020 1432   K 3.6  06/08/2021 0817   CL 107 06/08/2021 0817   CO2 24 06/08/2021 0817   GLUCOSE 124 (H) 06/08/2021 0817   BUN 13 06/08/2021 0817   BUN 11 10/06/2020 1432   CREATININE 0.75 06/08/2021 0817   CREATININE 0.76 05/19/2021 1015   CALCIUM 8.9 06/08/2021 0817   GFRNONAA >60 06/08/2021 0817   GFRNONAA >60 05/19/2021 1015   GFRAA >60 09/07/2019 1340    Renal function: CrCl cannot be calculated (Patient's most recent lab result is older than the maximum 21 days allowed.).  Clinical ASCVD: Yes  The 10-year ASCVD risk score (Arnett DK, et al., 2019) is: 10.6%   Values used to calculate the score:     Age: 65 years     Sex: Female     Is Non-Hispanic African American: Yes     Diabetic: No     Tobacco smoker: Yes     Systolic Blood Pressure: 245 mmHg     Is BP treated: Yes     HDL Cholesterol: 51 mg/dL     Total Cholesterol: 226 mg/dL  ASCVD risk factors include- [place]   ASSESSMENT & PLAN: Chelsea Hansen was seen today for hypertension.  Diagnoses and all orders for this visit:  Essential hypertension, benign -Counseled on lifestyle modifications for blood pressure control including reduced dietary sodium, increased exercise, weight reduction and adequate sleep. Also, educated patient about the risk for cardiovascular events, stroke and heart attack. Also counseled patient about the importance of medication adherence. If you participate in smoking, it is important  to stop using tobacco as this will increase the risks associated with uncontrolled blood pressure.   -Hypertension longstanding diagnosed currently Diovan 40mg   and Inderal 10mg  bid on current medications. Patient is adherent with current medications.   Goal BP:  For patients younger than 60: Goal BP < 130/80. For patients 60 and older: Goal BP < 140/90. For patients with diabetes: Goal BP < 130/80. Your most recent BP: 138/80  Minimize salt intake. Minimize alcohol intake    This note has been created with Biomedical engineer. Any transcriptional errors are unintentional.   [name], NP 08/21/2021, 10:20 AM

## 2021-08-24 ENCOUNTER — Ambulatory Visit: Payer: Medicaid Other

## 2021-08-24 ENCOUNTER — Ambulatory Visit: Payer: Medicaid Other | Admitting: Radiation Oncology

## 2021-08-25 NOTE — Progress Notes (Signed)
°Radiation Oncology         [phone] °________________________________ ° °Name: Chelsea Hansen MRN: [id number]  °Date: 08/26/2021  DOB: 01/15/1971 ° °Re-Evaluation Note ° °CC: Edwards, Michelle P, NP  [name], [name], MD ° °  ICD-10-CM   °1. 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 She was seen in the multidisciplinary breast clinic on 12/17/20.  ° °Since consultation, she underwent genetic testing on 12/17/20. Results showed no clinically actionable alterations detected. 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 °  °She has been treated with neoadjuvant chemotherapy consisting of Doxorubicin and Cyclophosphamide given x 4 on 01/27/2021 through 03/10/2021, followed by weekly paclitaxel x 12 beginning 03/24/2021 under the care of Dr. Magrinat and Dr. [name]. ° °She opted to proceed with bilateral breast lumpectomies and nodal biopsies on 07/09/21 under the care of Dr. Blackman. 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 Prognostic indicators significant for: ER 95% positive with strong staining intensity;  PR status 100% positive with strong staining intensity; Her2 status negative; proliferation marker Ki67 at 40%; Grade 2.  °--  Left lumpectomy: histology of high-grade ductal carcinoma in situ, clinging and solid type with focal necrosis and abundant luminal mucin, focally suspicious for  °microinvasion; tumor size of 0.7 x 0.6 x 0.6 cm; all margins negative for DCIS. Prognostic indicators significant for ER status 95% positive with strong staining intensity; PR status 100% positive with strong staining intensity °--  Nodal status: 1 left axially sentinel lymph node excision negative for carcinoma; 1/1 right axillary targeted lymph node excision positive for metastatic pleomorphic lobular carcinoma.  ° °During subsequent post-op follow-up's with Dr. Blackman, the patient reported swelling in both axillas, more so in the right axilla. During post-op follow up on 07/24/21, Dr. Blackman attempted to aspirate a presumed seroma multiple times in the right axilla but no fluid was aspirated. Per her most recent post-op follow-up on 07/31/21, her swelling in the right axilla had regressed upon examination. Minimal tenderness on palpation was noted.  ° °Pertinent imaging performed since the patient was last seen includes:  °-- Bilateral breast MRI on 12/23/20 showed: Biopsy proven malignancy in the upper-outer right breast measuring 3.9 x 3.0 x 3.5 cm with additional clumped non mass enhancement extending medially from the dominant mass spanning °2.5 cm; additional suspicious linear non mass enhancement in the upper °inner right breast spanning 0.8 cm; small superficial rim enhancing mass in the low right axilla measuring 0.9 cm which is indeterminate but favored to represent a °sebaceous cyst; abnormal biopsy-proven metastatic right axillary lymph node and 1 additional borderline lymph node with mild focal   cortical bulge; 0.9 cm enhancing mass in the upper outer left breast consistent with biopsy proven malignancy;  indeterminate 0.4 cm focus of enhancement in the medial aspect of the left breast. No left axillary adenopathy was appreciated.  °-- CT of the head on 02/02/21 showed no acute intracranial abnormalities.   °-- Bilateral breast MRI on 06/15/21 showed: a marked positive response to neoadjuvant chemotherapy, defined by the primary malignancy in the upper outer right breast showing a decrease in size and overall decrease in enhancement intensity. The previously described adjacent non mass enhancement was also seen to have resolved in the interval, and the previously biopsied right axillary lymph node was seen to have decreased in size and cortical thickness. Overall, no new breast malignancy or abnormal lymph nodes were appreciated in either breast.  ° °On review of systems, the patient reports some swelling and discomfort in the right axillary area.  She denies any problems with swelling in her right arm or hand.. She denies  swelling in her left arm or hand and any other symptoms.  ° ° °Allergies:  has No Known Allergies. ° °Meds: °Current Outpatient Medications  °Medication Sig Dispense Refill  ° albuterol (VENTOLIN HFA) 108 (90 Base) MCG/ACT inhaler Inhale 2 puffs into the lungs every 6 (six) hours as needed for wheezing or shortness of breath. 18 g 2  ° amLODipine (NORVASC) 10 MG tablet Take 1 tablet  by mouth daily. 90 tablet 1  ° ARIPiprazole (ABILIFY) 15 MG tablet Take 1 tablet by mouth at bedtime 30 tablet 0  ° Cholecalciferol (VITAMIN D3) 2000 units capsule Take 2,000 Units by mouth daily.  11  ° dexamethasone (DECADRON) 4 MG tablet Take 4 mg by mouth as needed. Given with chemo treatment.    ° escitalopram (LEXAPRO) 20 MG tablet Take 1 tablet (20 mg total) by mouth daily. 90 tablet 1  ° fluticasone (FLONASE) 50 MCG/ACT nasal spray Use 2 sprays in each nostril daily. 16 g 11  ° hydrochlorothiazide (HYDRODIURIL) 25 MG tablet Take 1 tablet by mouth daily. 90 tablet 1  ° montelukast (SINGULAIR) 10 MG tablet Take  1 tablet (10 mg total) by mouth at bedtime. 90 tablet 3  ° oxyCODONE-acetaminophen (PERCOCET) 5-325 MG tablet Take 1 tablet by mouth every 8 (eight) hours as needed for severe pain. Must last 30 days. 90 tablet 0  ° propranolol (INDERAL) 10 MG tablet Take 1 tablet (10 mg total) by mouth 2 (two) times daily. 180 tablet 3  ° valsartan (DIOVAN) 40 MG tablet Take 1 tablet (40 mg total) by mouth daily. 90 tablet 3  ° gabapentin (NEURONTIN) 300 MG capsule Take 1 capsule by mouth daily and 2 capsules at bedtime (Patient not taking: Reported on 08/26/2021) 90 capsule 2  ° [START ON 08/29/2021] oxyCODONE-acetaminophen (PERCOCET) 5-325 MG tablet Take 1 tablet by mouth every 8 (eight) hours as needed for severe pain. Must last 30 days.  **08/29/21 (Patient not taking: Reported on 08/26/2021) 90 tablet 0  ° prochlorperazine (COMPAZINE) 10 MG tablet Take 10 mg by mouth as needed. (Patient not taking: Reported on 08/26/2021)    ° promethazine (PHENERGAN) 25 MG suppository Place 25 mg rectally as needed. (Patient not taking: Reported on 08/26/2021)    ° °No current facility-administered medications for this encounter.  ° °Facility-Administered Medications Ordered in Other Encounters  °Medication Dose Route Frequency Provider Last Rate Last Admin  ° acetaminophen (TYLENOL) 500 MG tablet           ° ondansetron (  ZOFRAN) 4 MG/2ML injection           ° ondansetron (ZOFRAN) 4 MG/2ML injection           ° sodium chloride flush (NS) 0.9 % injection 10 mL  10 mL Intracatheter PRN Magrinat, Gustav C, MD   10 mL at 02/10/21 1716  ° ° °Physical Findings: °The patient is in no acute distress. Patient is alert and oriented. ° height is 5' 11" (1.803 m) and weight is 226 lb 6 oz (102.7 kg). Her temporal temperature is 96.9 °F (36.1 °C) (abnormal). Her blood pressure is 185/111 (abnormal) and her pulse is 76. Her respiration is 18 and oxygen saturation is 100%.  . Lungs are clear to auscultation bilaterally. Heart has regular rate and rhythm. No palpable  cervical, supraclavicular, or axillary adenopathy. Abdomen soft, non-tender, normal bowel sounds. °Left breast: no palpable mass, nipple discharge or bleeding.  1 scar noted in the upper outer quadrant of the breast which is healed well and encompasses the lumpectomy cavity and the sentinel node procedure. °Right breast: Well-healing periareolar scar.  No signs of drainage or infection.  Separate scar in the axillary area from targeted node dissection.  Some swelling noted in this area with mild tenderness.  No signs of infection or drainage. ° °Lab Findings: °Lab Results  °Component Value Date  ° WBC 3.6 (L) 06/08/2021  ° HGB 8.8 (L) 06/08/2021  ° HCT 27.9 (L) 06/08/2021  ° MCV 81.3 06/08/2021  ° PLT 374 06/08/2021  ° ° °Radiographic Findings: °No results found. 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  Would recommend radiation therapy to left breast as breast conserving surgery.  Since her sentinel node was negative for metastasis would recommend left breast only radiation therapy along the left side.  Would also recommend radiation therapy along the right side as breast conserving therapy.  Given the node positivity in the right axilla would also recommend comprehensive nodal radiation along the right side. ° °She would not be a candidate for hypofractionated accelerated radiation therapy given the necessity to treat nodal areas along the right side and the potential for overlap over the sternum area. ° ° ° °Plan:  Patient is scheduled for CT simulation later today.  Treatments to begin a week later.  Anticipate 6 and half weeks of radiation therapy. ° °----------------------------------- ° ° D. ,  PhD, MD ° °This document serves as a record of services personally performed by  , MD. It was created on his behalf by Elisa Frazier, a trained medical scribe. The creation of this record is based on the scribe's personal observations and the provider's statements to them. This document has been checked and approved by the attending provider. ° °

## 2021-08-26 ENCOUNTER — Ambulatory Visit
Admission: RE | Admit: 2021-08-26 | Discharge: 2021-08-26 | Disposition: A | Discharge status: Home / Self Care | Payer: Medicaid Other | Source: Ambulatory Visit | Attending: Radiation Oncology | Admitting: Radiation Oncology

## 2021-08-26 ENCOUNTER — Encounter (HOSPITAL_COMMUNITY): Payer: Self-pay

## 2021-08-26 ENCOUNTER — Encounter: Payer: Self-pay | Admitting: Radiation Oncology

## 2021-08-26 ENCOUNTER — Other Ambulatory Visit: Payer: Self-pay

## 2021-08-26 VITALS — BP 185/111 | HR 76 | Temp 96.9°F | Resp 18 | Ht 71.0 in | Wt 226.4 lb

## 2021-08-26 DIAGNOSIS — C50412 Malignant neoplasm of upper-outer quadrant of left female breast: Secondary | ICD-10-CM | POA: Insufficient documentation

## 2021-08-26 DIAGNOSIS — Z170 Estrogen receptor positive status [ER+]: Secondary | ICD-10-CM | POA: Diagnosis not present

## 2021-08-26 DIAGNOSIS — Z79899 Other long term (current) drug therapy: Secondary | ICD-10-CM | POA: Insufficient documentation

## 2021-08-26 DIAGNOSIS — Z510 Encounter for antineoplastic radiation therapy: Secondary | ICD-10-CM | POA: Insufficient documentation

## 2021-08-26 DIAGNOSIS — C50411 Malignant neoplasm of upper-outer quadrant of right female breast: Secondary | ICD-10-CM | POA: Insufficient documentation

## 2021-08-26 NOTE — Progress Notes (Signed)
See MD note for nursing evaluation. °

## 2021-08-27 ENCOUNTER — Ambulatory Visit (INDEPENDENT_AMBULATORY_CARE_PROVIDER_SITE_OTHER): Payer: Medicaid Other | Admitting: Clinical

## 2021-08-27 ENCOUNTER — Encounter (INDEPENDENT_AMBULATORY_CARE_PROVIDER_SITE_OTHER): Payer: Self-pay

## 2021-08-27 DIAGNOSIS — F319 Bipolar disorder, unspecified: Secondary | ICD-10-CM | POA: Diagnosis not present
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
## 2021-08-28 ENCOUNTER — Other Ambulatory Visit (HOSPITAL_COMMUNITY): Payer: Self-pay

## 2021-08-28 ENCOUNTER — Encounter: Payer: Self-pay | Admitting: Oncology

## 2021-08-29 ENCOUNTER — Other Ambulatory Visit (HOSPITAL_COMMUNITY): Payer: Self-pay

## 2021-08-31 ENCOUNTER — Encounter: Payer: Self-pay | Admitting: *Deleted

## 2021-09-02 ENCOUNTER — Ambulatory Visit: Payer: Medicaid Other | Admitting: Radiation Oncology

## 2021-09-02 ENCOUNTER — Telehealth: Payer: Self-pay | Admitting: Hematology and Oncology

## 2021-09-02 DIAGNOSIS — Z510 Encounter for antineoplastic radiation therapy: Secondary | ICD-10-CM | POA: Diagnosis not present

## 2021-09-02 NOTE — Telephone Encounter (Signed)
Called pt to schedule per 3/13 inbasket, pt informed me she will be moving dates for radonc due to death in family, I will check back in on the schedule on 3/17 and schedule appt.  ?

## 2021-09-03 ENCOUNTER — Encounter: Payer: Self-pay | Admitting: Oncology

## 2021-09-03 ENCOUNTER — Other Ambulatory Visit: Payer: Self-pay

## 2021-09-03 ENCOUNTER — Ambulatory Visit
Admission: RE | Admit: 2021-09-03 | Discharge: 2021-09-03 | Disposition: A | Discharge status: Home / Self Care | Payer: Medicaid Other | Source: Ambulatory Visit | Attending: Radiation Oncology | Admitting: Radiation Oncology

## 2021-09-03 DIAGNOSIS — Z510 Encounter for antineoplastic radiation therapy: Secondary | ICD-10-CM | POA: Diagnosis not present

## 2021-09-04 ENCOUNTER — Ambulatory Visit
Admission: RE | Admit: 2021-09-04 | Discharge: 2021-09-04 | Disposition: A | Discharge status: Home / Self Care | Payer: Medicaid Other | Source: Ambulatory Visit | Attending: Radiation Oncology | Admitting: Radiation Oncology

## 2021-09-04 DIAGNOSIS — Z510 Encounter for antineoplastic radiation therapy: Secondary | ICD-10-CM | POA: Diagnosis not present

## 2021-09-07 ENCOUNTER — Other Ambulatory Visit: Payer: Self-pay

## 2021-09-07 ENCOUNTER — Ambulatory Visit
Admission: RE | Admit: 2021-09-07 | Discharge: 2021-09-07 | Disposition: A | Discharge status: Home / Self Care | Payer: Medicaid Other | Source: Ambulatory Visit | Attending: Radiation Oncology | Admitting: Radiation Oncology

## 2021-09-07 DIAGNOSIS — Z510 Encounter for antineoplastic radiation therapy: Secondary | ICD-10-CM | POA: Diagnosis not present

## 2021-09-08 ENCOUNTER — Ambulatory Visit
Admission: RE | Admit: 2021-09-08 | Discharge: 2021-09-08 | Disposition: A | Discharge status: Home / Self Care | Payer: Medicaid Other | Source: Ambulatory Visit | Attending: Radiation Oncology | Admitting: Radiation Oncology

## 2021-09-08 DIAGNOSIS — Z510 Encounter for antineoplastic radiation therapy: Secondary | ICD-10-CM | POA: Diagnosis not present

## 2021-09-08 DIAGNOSIS — C50411 Malignant neoplasm of upper-outer quadrant of right female breast: Secondary | ICD-10-CM

## 2021-09-08 MED ORDER — RADIAPLEXRX EX GEL: Freq: Once | CUTANEOUS | Status: AC

## 2021-09-08 NOTE — Progress Notes (Signed)
Pt here for patient teaching.   ? ?Pt given Radiation and You booklet, skin care instructions, and Radiaplex gel.   ? ?Reviewed areas of pertinence such as fatigue, hair loss, skin changes, breast tenderness, and breast swelling .  ? ?Pt able to give teach back of to pat skin,apply Radiaplex bid, avoid applying anything to skin within 4 hours of treatment, avoid wearing an under wire bra, and to use an electric razor if they must shave.  ? ?Pt verbalizes understanding of information given and will contact nursing with any questions or concerns.   ? ? ? ? ? ?  ? ? ? ? ?  ? ?  ? ? ? ? ?  ?

## 2021-09-09 ENCOUNTER — Other Ambulatory Visit: Payer: Self-pay

## 2021-09-09 ENCOUNTER — Ambulatory Visit
Admission: RE | Admit: 2021-09-09 | Discharge: 2021-09-09 | Disposition: A | Discharge status: Home / Self Care | Payer: Medicaid Other | Source: Ambulatory Visit | Attending: Radiation Oncology | Admitting: Radiation Oncology

## 2021-09-09 DIAGNOSIS — Z510 Encounter for antineoplastic radiation therapy: Secondary | ICD-10-CM | POA: Diagnosis not present

## 2021-09-10 ENCOUNTER — Ambulatory Visit
Admission: RE | Admit: 2021-09-10 | Discharge: 2021-09-10 | Disposition: A | Discharge status: Home / Self Care | Payer: Medicaid Other | Source: Ambulatory Visit | Attending: Radiation Oncology | Admitting: Radiation Oncology

## 2021-09-10 ENCOUNTER — Ambulatory Visit (INDEPENDENT_AMBULATORY_CARE_PROVIDER_SITE_OTHER): Payer: Medicaid Other | Admitting: Clinical

## 2021-09-10 ENCOUNTER — Other Ambulatory Visit: Payer: Self-pay

## 2021-09-10 DIAGNOSIS — Z510 Encounter for antineoplastic radiation therapy: Secondary | ICD-10-CM | POA: Diagnosis not present

## 2021-09-11 ENCOUNTER — Ambulatory Visit
Admission: RE | Admit: 2021-09-11 | Discharge: 2021-09-11 | Disposition: A | Discharge status: Home / Self Care | Payer: Medicaid Other | Source: Ambulatory Visit | Attending: Radiation Oncology | Admitting: Radiation Oncology

## 2021-09-11 DIAGNOSIS — Z510 Encounter for antineoplastic radiation therapy: Secondary | ICD-10-CM | POA: Diagnosis not present

## 2021-09-14 ENCOUNTER — Ambulatory Visit
Admission: RE | Admit: 2021-09-14 | Discharge: 2021-09-14 | Disposition: A | Discharge status: Home / Self Care | Payer: Medicaid Other | Source: Ambulatory Visit | Attending: Radiation Oncology | Admitting: Radiation Oncology

## 2021-09-14 ENCOUNTER — Other Ambulatory Visit: Payer: Self-pay

## 2021-09-14 DIAGNOSIS — Z510 Encounter for antineoplastic radiation therapy: Secondary | ICD-10-CM | POA: Diagnosis not present

## 2021-09-15 ENCOUNTER — Other Ambulatory Visit: Payer: Self-pay

## 2021-09-15 ENCOUNTER — Ambulatory Visit
Admission: RE | Admit: 2021-09-15 | Discharge: 2021-09-15 | Disposition: A | Discharge status: Home / Self Care | Payer: Medicaid Other | Source: Ambulatory Visit | Attending: Radiation Oncology | Admitting: Radiation Oncology

## 2021-09-15 DIAGNOSIS — Z510 Encounter for antineoplastic radiation therapy: Secondary | ICD-10-CM | POA: Diagnosis not present

## 2021-09-16 ENCOUNTER — Ambulatory Visit
Admission: RE | Admit: 2021-09-16 | Discharge: 2021-09-16 | Disposition: A | Discharge status: Home / Self Care | Payer: Medicaid Other | Source: Ambulatory Visit | Attending: Radiation Oncology | Admitting: Radiation Oncology

## 2021-09-16 DIAGNOSIS — Z510 Encounter for antineoplastic radiation therapy: Secondary | ICD-10-CM | POA: Diagnosis not present

## 2021-09-17 ENCOUNTER — Ambulatory Visit
Admission: RE | Admit: 2021-09-17 | Discharge: 2021-09-17 | Disposition: A | Discharge status: Home / Self Care | Payer: Medicaid Other | Source: Ambulatory Visit | Attending: Radiation Oncology | Admitting: Radiation Oncology

## 2021-09-17 DIAGNOSIS — Z510 Encounter for antineoplastic radiation therapy: Secondary | ICD-10-CM | POA: Diagnosis not present

## 2021-09-18 ENCOUNTER — Other Ambulatory Visit: Payer: Self-pay

## 2021-09-18 ENCOUNTER — Ambulatory Visit
Admission: RE | Admit: 2021-09-18 | Discharge: 2021-09-18 | Disposition: A | Discharge status: Home / Self Care | Payer: Medicaid Other | Source: Ambulatory Visit | Attending: Radiation Oncology | Admitting: Radiation Oncology

## 2021-09-18 DIAGNOSIS — Z510 Encounter for antineoplastic radiation therapy: Secondary | ICD-10-CM | POA: Diagnosis not present

## 2021-09-18 DIAGNOSIS — C50412 Malignant neoplasm of upper-outer quadrant of left female breast: Secondary | ICD-10-CM | POA: Diagnosis present

## 2021-09-18 DIAGNOSIS — C50411 Malignant neoplasm of upper-outer quadrant of right female breast: Secondary | ICD-10-CM | POA: Diagnosis present

## 2021-09-21 ENCOUNTER — Ambulatory Visit: Payer: Medicaid Other

## 2021-09-22 ENCOUNTER — Telehealth: Payer: Self-pay | Admitting: Hematology and Oncology

## 2021-09-22 ENCOUNTER — Ambulatory Visit: Payer: Medicaid Other

## 2021-09-22 NOTE — Telephone Encounter (Signed)
.  Called pt per 3/21 inbasket , Patient was unavailable, a message with appt time and date was left with number on file.   ?

## 2021-09-23 ENCOUNTER — Ambulatory Visit: Payer: Medicaid Other

## 2021-09-24 ENCOUNTER — Ambulatory Visit: Payer: Medicaid Other

## 2021-09-24 ENCOUNTER — Encounter: Payer: Medicaid Other | Admitting: Student in an Organized Health Care Education/Training Program

## 2021-09-25 ENCOUNTER — Ambulatory Visit: Payer: Medicaid Other

## 2021-09-28 ENCOUNTER — Ambulatory Visit: Payer: Medicaid Other

## 2021-09-29 ENCOUNTER — Other Ambulatory Visit (HOSPITAL_COMMUNITY): Payer: Self-pay

## 2021-09-29 ENCOUNTER — Ambulatory Visit
Payer: Medicaid Other | Attending: Student in an Organized Health Care Education/Training Program | Admitting: Student in an Organized Health Care Education/Training Program

## 2021-09-29 ENCOUNTER — Telehealth (INDEPENDENT_AMBULATORY_CARE_PROVIDER_SITE_OTHER): Payer: Self-pay

## 2021-09-29 ENCOUNTER — Ambulatory Visit
Admission: RE | Admit: 2021-09-29 | Discharge: 2021-09-29 | Disposition: A | Discharge status: Home / Self Care | Payer: Medicaid Other | Source: Ambulatory Visit | Attending: Radiation Oncology | Admitting: Radiation Oncology

## 2021-09-29 ENCOUNTER — Encounter: Payer: Self-pay | Admitting: Student in an Organized Health Care Education/Training Program

## 2021-09-29 ENCOUNTER — Other Ambulatory Visit: Payer: Self-pay

## 2021-09-29 ENCOUNTER — Encounter: Payer: Self-pay | Admitting: Oncology

## 2021-09-29 VITALS — BP 155/92 | HR 78 | Temp 97.2°F | Resp 16 | Ht 71.0 in | Wt 226.0 lb

## 2021-09-29 DIAGNOSIS — E669 Obesity, unspecified: Secondary | ICD-10-CM

## 2021-09-29 DIAGNOSIS — Z170 Estrogen receptor positive status [ER+]: Secondary | ICD-10-CM

## 2021-09-29 DIAGNOSIS — G894 Chronic pain syndrome: Secondary | ICD-10-CM | POA: Diagnosis present

## 2021-09-29 DIAGNOSIS — C50412 Malignant neoplasm of upper-outer quadrant of left female breast: Secondary | ICD-10-CM | POA: Insufficient documentation

## 2021-09-29 DIAGNOSIS — C50411 Malignant neoplasm of upper-outer quadrant of right female breast: Secondary | ICD-10-CM | POA: Diagnosis present

## 2021-09-29 DIAGNOSIS — Z0289 Encounter for other administrative examinations: Secondary | ICD-10-CM | POA: Diagnosis present

## 2021-09-29 MED ORDER — OXYCODONE-ACETAMINOPHEN 5-325 MG PO TABS
1.0000 | ORAL_TABLET | Freq: Three times a day (TID) | ORAL | 0 refills | Status: DC | PRN
Start: 2021-10-29 — End: 2021-11-19
  Filled 2021-10-29: qty 90, 30d supply, fill #0

## 2021-09-29 MED ORDER — OXYCODONE-ACETAMINOPHEN 5-325 MG PO TABS
1.0000 | ORAL_TABLET | Freq: Three times a day (TID) | ORAL | 0 refills | Status: AC | PRN
  Filled 2021-09-29: qty 90, 30d supply, fill #0

## 2021-09-29 MED ORDER — RADIAPLEXRX EX GEL: Freq: Once | CUTANEOUS | Status: AC

## 2021-09-29 NOTE — Progress Notes (Signed)
PROVIDER NOTE: Information contained herein reflects review and annotations entered in association with encounter. Interpretation of such information and data should be left to medically-trained personnel. Information provided to patient can be located elsewhere in the medical record under "Patient Instructions". Document created using STT-dictation technology, any transcriptional errors that may result from process are unintentional.  ?  ?Patient: Chelsea Hansen  Service Category: E/M  Provider: Gillis Santa, MD  ?DOB: July 08, 1970  DOS: 09/29/2021  Specialty: Interventional Pain Management  ?MRN: [id number]  Setting: Ambulatory outpatient  PCP: [name], NP  ?Type: Established Patient    Referring Provider: [name], NP  ?Location: Office  Delivery: Face-to-face    ? ?HPI  ?Chelsea Hansen, a 51 y.o. year old female, is here today because of her Chronic pain syndrome [G89.4]. Ms. Heeg primary complain today is Other (Cancer pain, breast cancer bilateral of 2 different entities. ) ?Last encounter: My last encounter with her was on 07/30/21 ?Pertinent problems: [name] has Malignant neoplasm of upper-outer quadrant of right breast in female, estrogen receptor positive ([place]); Malignant neoplasm of upper-outer quadrant of left breast in female, estrogen receptor positive ([place]); Obesity (BMI 30-39.9); Pain management contract signed; and Chronic pain syndrome (breast cancer) on their pertinent problem list. ?Pain Assessment: Severity of Chronic pain is reported as a 8 /10. Location: Other (Comment) (pain r/t breast cancer treatement .) Left, Right/denies. Onset: More than a month ago. Quality: Discomfort, Constant, Sharp. Timing: Constant. Modifying factor(s): pain medications. ?Vitals:  height is '5\' 11"'$  (1.803 m) and weight is 226 lb (102.5 kg). Her temporal temperature is 97.2 ?F (36.2 ?C) (abnormal). Her blood pressure is 155/92 (abnormal) and her pulse is 78. Her respiration is 16 and oxygen  saturation is 99%.  ? ?Reason for encounter: medication management.   ? ?-Patient is status post bilateral breast lumpectomy for breast cancer done on 07/09/2021.  ?-She is receiving radiation therapy for her breast cancer. ?-doing better on her current regimen of Oxycodone q8 hrs vs Tramadol and Hydrocodone which she tried before ? ?Pharmacotherapy Assessment  ?Analgesic: ? ?Percocet 5 mg every 8 hours as needed ? ?Monitoring: ?[place] PMP: PDMP reviewed during this encounter.       ?Pharmacotherapy: No side-effects or adverse reactions reported. ?Compliance: No problems identified. ?Effectiveness: Clinically acceptable. ? ?Janett Billow, RN  09/29/2021  8:54 AM  Sign when Signing Visit ?Nursing Pain Medication Assessment:  ?Safety precautions to be maintained throughout the outpatient stay will include: orient to surroundings, keep bed in low position, maintain call bell within reach at all times, provide assistance with transfer out of bed and ambulation.  ?Medication Inspection Compliance: Pill count conducted under aseptic conditions, in front of the patient. Neither the pills nor the bottle was removed from the patient's sight at any time. Once count was completed pills were immediately returned to the patient in their original bottle. ? ?Medication: Oxycodone/APAP ?Pill/Patch Count:  3 of 90 pills remain ?Pill/Patch Appearance: Markings consistent with prescribed medication ?Bottle Appearance: Standard pharmacy container. Clearly labeled. ?Filled Date: 03 / 11 / 2023 ?Last Medication intake:  Yesterday ?  ?  UDS:  ?Summary  ?Date Value Ref Range Status  ?04/08/2021 Note  Final  ?  Comment:  ?  ==================================================================== ?Compliance Drug Analysis, Ur ?==================================================================== ?Test                             Result  Flag       Units ? ?Drug Present not Declared for Prescription Verification ?  Diphenhydramine                 PRESENT      UNEXPECTED ? ?Drug Absent but Declared for Prescription Verification ?  Gabapentin                     Not Detected UNEXPECTED ?  Citalopram                     Not Detected UNEXPECTED ?  Acetaminophen                  Not Detected UNEXPECTED ?   Acetaminophen, as indicated in the declared medication list, is not ?   always detected even when used as directed. ? ?==================================================================== ?Test                      Result    Flag   Units      Ref Range ?  Creatinine              133              mg/dL      >=20 ?==================================================================== ?Declared Medications: ? The flagging and interpretation on this report are based on the ? following declared medications.  Unexpected results may arise from ? inaccuracies in the declared medications. ? ? **Note: The testing scope of this panel includes these medications: ? ? Escitalopram (Lexapro) ? Gabapentin (Neurontin) ? ? **Note: The testing scope of this panel does not include small to ? moderate amounts of these reported medications: ? ? Acetaminophen (Tylenol) ? ? **Note: The testing scope of this panel does not include the ? following reported medications: ? ? Albuterol (Ventolin HFA) ? Amlodipine (Norvasc) ? Fluticasone (Flonase) ? Hydrochlorothiazide ? Loratadine (Claritin) ? Montelukast (Singulair) ? Multivitamin ? Omeprazole (Prilosec) ? Vitamin D3 ?==================================================================== ?For clinical consultation, please call [phone]. ?==================================================================== ?  ?  ? ?ROS  ?Constitutional: Denies any fever or chills ?Gastrointestinal: No reported hemesis, hematochezia, vomiting, or acute GI distress ?Musculoskeletal: Denies any acute onset joint swelling, redness, loss of ROM, or weakness ?Neurological: No reported episodes of acute onset apraxia, aphasia, dysarthria, agnosia, amnesia,  paralysis, loss of coordination, or loss of consciousness ? ?Medication Review  ?ARIPiprazole, Vitamin D3, albuterol, amLODipine, dexamethasone, escitalopram, fluticasone, hydrochlorothiazide, montelukast, oxyCODONE-acetaminophen, propranolol, and valsartan ? ?History Review  ?Allergy: Ms. Falletta has No Known Allergies. ?Drug: Ms. Bougher  reports no history of drug use. ?Alcohol:  reports that she does not currently use alcohol after a past usage of about 7.0 standard drinks per week. ?Tobacco:  reports that she has never smoked. She has never used smokeless tobacco. ?Social: Ms. Moskal  reports that she has never smoked. She has never used smokeless tobacco. She reports that she does not currently use alcohol after a past usage of about 7.0 standard drinks per week. She reports that she does not use drugs. ?Medical:  has a past medical history of Allergy, Anxiety, Asthma, Breast cancer ([place]) (2012), Cancer ([place]) (2022), Depression, DVT (deep venous thrombosis) ([place]) (2010), Family history of uterine cancer, GERD (gastroesophageal reflux disease), Headache(784.0), Hyperlipidemia, Hypertension, and SVT (supraventricular tachycardia) ([place]). ?Surgical: Ms. Standlee  has a past surgical history that includes Abdominal hysterectomy; Breast surgery; Tubal ligation; Breast excisional biopsy (Right, 09/16/2009); Portacath placement (Left, 01/26/2021); Breast lumpectomy with radioactive seed  localization (Bilateral, 07/09/2021); Radioactive seed guided axillary sentinel lymph node (Right, 07/09/2021); Axillary sentinel node biopsy (Left, 07/09/2021); and Port-a-cath removal (Left, 07/09/2021). ?Family: family history includes Cancer in her maternal aunt, maternal aunt, and mother; Hypertension in her father; Uterine cancer (age of onset: 65) in her cousin. ? ?Laboratory Chemistry Profile  ? ?Renal ?Lab Results  ?Component Value Date  ? BUN 13 06/08/2021  ? CREATININE 0.75 06/08/2021  ? BCR 15 10/06/2020  ? GFRAA >60 09/07/2019  ? GFRNONAA  >60 06/08/2021  ?  Hepatic ?Lab Results  ?Component Value Date  ? AST 16 06/08/2021  ? ALT 20 06/08/2021  ? ALBUMIN 3.6 06/08/2021  ? ALKPHOS 84 06/08/2021  ? LIPASE 21 01/28/2021  ?  ?Electrolytes ?Lab

## 2021-09-29 NOTE — Progress Notes (Signed)
Nursing Pain Medication Assessment:  ?Safety precautions to be maintained throughout the outpatient stay will include: orient to surroundings, keep bed in low position, maintain call bell within reach at all times, provide assistance with transfer out of bed and ambulation.  ?Medication Inspection Compliance: Pill count conducted under aseptic conditions, in front of the patient. Neither the pills nor the bottle was removed from the patient's sight at any time. Once count was completed pills were immediately returned to the patient in their original bottle. ? ?Medication: Oxycodone/APAP ?Pill/Patch Count:  3 of 90 pills remain ?Pill/Patch Appearance: Markings consistent with prescribed medication ?Bottle Appearance: Standard pharmacy container. Clearly labeled. ?Filled Date: 03 / 11 / 2023 ?Last Medication intake:  Yesterday ?

## 2021-09-29 NOTE — Telephone Encounter (Signed)
Copied from [place] [phone]. Topic: Appointment Scheduling - Scheduling Inquiry for Clinic ?>> Sep 29, 2021 12:37 PM Pawlus, Brayton Layman A wrote: ?Reason for CRM: Pt wanted to set up an appt with the social worker, please advise. ?

## 2021-09-30 ENCOUNTER — Ambulatory Visit
Admission: RE | Admit: 2021-09-30 | Discharge: 2021-09-30 | Disposition: A | Discharge status: Home / Self Care | Payer: Medicaid Other | Source: Ambulatory Visit | Attending: Radiation Oncology | Admitting: Radiation Oncology

## 2021-09-30 DIAGNOSIS — C50412 Malignant neoplasm of upper-outer quadrant of left female breast: Secondary | ICD-10-CM | POA: Diagnosis not present

## 2021-10-01 ENCOUNTER — Encounter: Payer: Self-pay | Admitting: Oncology

## 2021-10-01 ENCOUNTER — Other Ambulatory Visit: Payer: Self-pay

## 2021-10-01 ENCOUNTER — Other Ambulatory Visit (HOSPITAL_COMMUNITY): Payer: Self-pay

## 2021-10-01 ENCOUNTER — Ambulatory Visit
Admission: RE | Admit: 2021-10-01 | Discharge: 2021-10-01 | Disposition: A | Discharge status: Home / Self Care | Payer: Medicaid Other | Source: Ambulatory Visit | Attending: Radiation Oncology | Admitting: Radiation Oncology

## 2021-10-01 DIAGNOSIS — C50412 Malignant neoplasm of upper-outer quadrant of left female breast: Secondary | ICD-10-CM | POA: Diagnosis not present
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
## 2021-10-02 ENCOUNTER — Ambulatory Visit
Admission: RE | Admit: 2021-10-02 | Discharge: 2021-10-02 | Disposition: A | Discharge status: Home / Self Care | Payer: Medicaid Other | Source: Ambulatory Visit | Attending: Radiation Oncology | Admitting: Radiation Oncology

## 2021-10-02 DIAGNOSIS — C50412 Malignant neoplasm of upper-outer quadrant of left female breast: Secondary | ICD-10-CM | POA: Diagnosis not present

## 2021-10-05 ENCOUNTER — Ambulatory Visit
Admission: RE | Admit: 2021-10-05 | Discharge: 2021-10-05 | Disposition: A | Discharge status: Home / Self Care | Payer: Medicaid Other | Source: Ambulatory Visit | Attending: Radiation Oncology | Admitting: Radiation Oncology

## 2021-10-05 DIAGNOSIS — C50412 Malignant neoplasm of upper-outer quadrant of left female breast: Secondary | ICD-10-CM | POA: Diagnosis not present

## 2021-10-06 ENCOUNTER — Other Ambulatory Visit: Payer: Self-pay

## 2021-10-06 ENCOUNTER — Ambulatory Visit: Payer: Medicaid Other | Admitting: Radiation Oncology

## 2021-10-06 ENCOUNTER — Ambulatory Visit
Admission: RE | Admit: 2021-10-06 | Discharge: 2021-10-06 | Disposition: A | Discharge status: Home / Self Care | Payer: Medicaid Other | Source: Ambulatory Visit | Attending: Radiation Oncology | Admitting: Radiation Oncology

## 2021-10-06 DIAGNOSIS — C50412 Malignant neoplasm of upper-outer quadrant of left female breast: Secondary | ICD-10-CM | POA: Diagnosis not present

## 2021-10-06 LAB — RAD ONC ARIA SESSION SUMMARY
Course Elapsed Days: 33
Plan Fractions Treated to Date: 18
Plan Fractions Treated to Date: 18
Plan Fractions Treated to Date: 18
Plan Prescribed Dose Per Fraction: 1.8 Gy
Plan Prescribed Dose Per Fraction: 1.8 Gy
Plan Prescribed Dose Per Fraction: 1.8 Gy
Plan Total Fractions Prescribed: 28
Plan Total Fractions Prescribed: 28
Plan Total Fractions Prescribed: 28
Plan Total Prescribed Dose: 50.4 Gy
Plan Total Prescribed Dose: 50.4 Gy
Plan Total Prescribed Dose: 50.4 Gy
Reference Point Dosage Given to Date: 32.4 Gy
Reference Point Dosage Given to Date: 32.4 Gy
Reference Point Dosage Given to Date: 32.4 Gy
Reference Point Session Dosage Given: 1.8 Gy
Reference Point Session Dosage Given: 1.8 Gy
Reference Point Session Dosage Given: 1.8 Gy
Session Number: 18

## 2021-10-07 ENCOUNTER — Ambulatory Visit
Admission: RE | Admit: 2021-10-07 | Discharge: 2021-10-07 | Disposition: A | Discharge status: Home / Self Care | Payer: Medicaid Other | Source: Ambulatory Visit | Attending: Radiation Oncology | Admitting: Radiation Oncology

## 2021-10-07 ENCOUNTER — Other Ambulatory Visit: Payer: Self-pay

## 2021-10-07 DIAGNOSIS — C50412 Malignant neoplasm of upper-outer quadrant of left female breast: Secondary | ICD-10-CM | POA: Diagnosis not present

## 2021-10-07 LAB — RAD ONC ARIA SESSION SUMMARY
Course Elapsed Days: 34
Plan Fractions Treated to Date: 19
Plan Fractions Treated to Date: 19
Plan Fractions Treated to Date: 19
Plan Prescribed Dose Per Fraction: 1.8 Gy
Plan Prescribed Dose Per Fraction: 1.8 Gy
Plan Prescribed Dose Per Fraction: 1.8 Gy
Plan Total Fractions Prescribed: 28
Plan Total Fractions Prescribed: 28
Plan Total Fractions Prescribed: 28
Plan Total Prescribed Dose: 50.4 Gy
Plan Total Prescribed Dose: 50.4 Gy
Plan Total Prescribed Dose: 50.4 Gy
Reference Point Dosage Given to Date: 34.2 Gy
Reference Point Dosage Given to Date: 34.2 Gy
Reference Point Dosage Given to Date: 34.2 Gy
Reference Point Session Dosage Given: 1.8 Gy
Reference Point Session Dosage Given: 1.8 Gy
Reference Point Session Dosage Given: 1.8 Gy
Session Number: 19

## 2021-10-08 ENCOUNTER — Ambulatory Visit
Admission: RE | Admit: 2021-10-08 | Discharge: 2021-10-08 | Disposition: A | Discharge status: Home / Self Care | Payer: Medicaid Other | Source: Ambulatory Visit | Attending: Radiation Oncology | Admitting: Radiation Oncology

## 2021-10-08 ENCOUNTER — Other Ambulatory Visit: Payer: Self-pay

## 2021-10-08 DIAGNOSIS — C50412 Malignant neoplasm of upper-outer quadrant of left female breast: Secondary | ICD-10-CM | POA: Diagnosis not present

## 2021-10-08 LAB — RAD ONC ARIA SESSION SUMMARY
Course Elapsed Days: 35
Plan Fractions Treated to Date: 20
Plan Fractions Treated to Date: 20
Plan Fractions Treated to Date: 20
Plan Prescribed Dose Per Fraction: 1.8 Gy
Plan Prescribed Dose Per Fraction: 1.8 Gy
Plan Prescribed Dose Per Fraction: 1.8 Gy
Plan Total Fractions Prescribed: 28
Plan Total Fractions Prescribed: 28
Plan Total Fractions Prescribed: 28
Plan Total Prescribed Dose: 50.4 Gy
Plan Total Prescribed Dose: 50.4 Gy
Plan Total Prescribed Dose: 50.4 Gy
Reference Point Dosage Given to Date: 36 Gy
Reference Point Dosage Given to Date: 36 Gy
Reference Point Dosage Given to Date: 36 Gy
Reference Point Session Dosage Given: 1.8 Gy
Reference Point Session Dosage Given: 1.8 Gy
Reference Point Session Dosage Given: 1.8 Gy
Session Number: 20

## 2021-10-09 ENCOUNTER — Telehealth: Payer: Self-pay | Admitting: Radiation Oncology

## 2021-10-09 ENCOUNTER — Ambulatory Visit: Payer: Medicaid Other

## 2021-10-09 ENCOUNTER — Telehealth: Payer: Self-pay | Admitting: Clinical

## 2021-10-09 NOTE — Telephone Encounter (Signed)
Patient called stating she will not be able to come in for treatment 4/21 due to experiencing pain. L2 notified.  ?

## 2021-10-09 NOTE — Telephone Encounter (Signed)
I attempted to call pt as she previously missed an appt with me. No answer, left vm. ?

## 2021-10-12 ENCOUNTER — Ambulatory Visit
Admission: RE | Admit: 2021-10-12 | Discharge: 2021-10-12 | Disposition: A | Discharge status: Home / Self Care | Payer: Medicaid Other | Source: Ambulatory Visit | Attending: Radiation Oncology | Admitting: Radiation Oncology

## 2021-10-12 ENCOUNTER — Other Ambulatory Visit: Payer: Self-pay

## 2021-10-12 DIAGNOSIS — C50412 Malignant neoplasm of upper-outer quadrant of left female breast: Secondary | ICD-10-CM | POA: Diagnosis not present

## 2021-10-12 LAB — RAD ONC ARIA SESSION SUMMARY
Course Elapsed Days: 39
Plan Fractions Treated to Date: 21
Plan Fractions Treated to Date: 21
Plan Fractions Treated to Date: 21
Plan Prescribed Dose Per Fraction: 1.8 Gy
Plan Prescribed Dose Per Fraction: 1.8 Gy
Plan Prescribed Dose Per Fraction: 1.8 Gy
Plan Total Fractions Prescribed: 28
Plan Total Fractions Prescribed: 28
Plan Total Fractions Prescribed: 28
Plan Total Prescribed Dose: 50.4 Gy
Plan Total Prescribed Dose: 50.4 Gy
Plan Total Prescribed Dose: 50.4 Gy
Reference Point Dosage Given to Date: 37.8 Gy
Reference Point Dosage Given to Date: 37.8 Gy
Reference Point Dosage Given to Date: 37.8 Gy
Reference Point Session Dosage Given: 1.8 Gy
Reference Point Session Dosage Given: 1.8 Gy
Reference Point Session Dosage Given: 1.8 Gy
Session Number: 21

## 2021-10-13 ENCOUNTER — Ambulatory Visit: Payer: Medicaid Other | Admitting: Radiation Oncology

## 2021-10-13 ENCOUNTER — Ambulatory Visit: Payer: Medicaid Other

## 2021-10-14 ENCOUNTER — Ambulatory Visit
Admission: RE | Admit: 2021-10-14 | Discharge: 2021-10-14 | Disposition: A | Discharge status: Home / Self Care | Payer: Medicaid Other | Source: Ambulatory Visit | Attending: Radiation Oncology | Admitting: Radiation Oncology

## 2021-10-14 ENCOUNTER — Other Ambulatory Visit: Payer: Self-pay

## 2021-10-14 DIAGNOSIS — C50412 Malignant neoplasm of upper-outer quadrant of left female breast: Secondary | ICD-10-CM | POA: Diagnosis not present

## 2021-10-14 LAB — RAD ONC ARIA SESSION SUMMARY
Course Elapsed Days: 41
Plan Fractions Treated to Date: 22
Plan Fractions Treated to Date: 22
Plan Fractions Treated to Date: 22
Plan Prescribed Dose Per Fraction: 1.8 Gy
Plan Prescribed Dose Per Fraction: 1.8 Gy
Plan Prescribed Dose Per Fraction: 1.8 Gy
Plan Total Fractions Prescribed: 28
Plan Total Fractions Prescribed: 28
Plan Total Fractions Prescribed: 28
Plan Total Prescribed Dose: 50.4 Gy
Plan Total Prescribed Dose: 50.4 Gy
Plan Total Prescribed Dose: 50.4 Gy
Reference Point Dosage Given to Date: 39.6 Gy
Reference Point Dosage Given to Date: 39.6 Gy
Reference Point Dosage Given to Date: 39.6 Gy
Reference Point Session Dosage Given: 1.8 Gy
Reference Point Session Dosage Given: 1.8 Gy
Reference Point Session Dosage Given: 1.8 Gy
Session Number: 22

## 2021-10-15 ENCOUNTER — Other Ambulatory Visit: Payer: Self-pay

## 2021-10-15 ENCOUNTER — Ambulatory Visit
Admission: RE | Admit: 2021-10-15 | Discharge: 2021-10-15 | Disposition: A | Discharge status: Home / Self Care | Payer: Medicaid Other | Source: Ambulatory Visit | Attending: Radiation Oncology | Admitting: Radiation Oncology

## 2021-10-15 ENCOUNTER — Inpatient Hospital Stay (HOSPITAL_BASED_OUTPATIENT_CLINIC_OR_DEPARTMENT_OTHER): Payer: Medicaid Other | Admitting: Hematology and Oncology

## 2021-10-15 ENCOUNTER — Other Ambulatory Visit (HOSPITAL_COMMUNITY): Payer: Self-pay

## 2021-10-15 DIAGNOSIS — Z170 Estrogen receptor positive status [ER+]: Secondary | ICD-10-CM

## 2021-10-15 DIAGNOSIS — L598 Other specified disorders of the skin and subcutaneous tissue related to radiation: Secondary | ICD-10-CM | POA: Insufficient documentation

## 2021-10-15 DIAGNOSIS — Z7952 Long term (current) use of systemic steroids: Secondary | ICD-10-CM | POA: Insufficient documentation

## 2021-10-15 DIAGNOSIS — C50411 Malignant neoplasm of upper-outer quadrant of right female breast: Secondary | ICD-10-CM | POA: Insufficient documentation

## 2021-10-15 DIAGNOSIS — Z79899 Other long term (current) drug therapy: Secondary | ICD-10-CM | POA: Insufficient documentation

## 2021-10-15 DIAGNOSIS — C773 Secondary and unspecified malignant neoplasm of axilla and upper limb lymph nodes: Secondary | ICD-10-CM | POA: Insufficient documentation

## 2021-10-15 DIAGNOSIS — C50412 Malignant neoplasm of upper-outer quadrant of left female breast: Secondary | ICD-10-CM | POA: Diagnosis not present

## 2021-10-15 LAB — RAD ONC ARIA SESSION SUMMARY
Course Elapsed Days: 42
Plan Fractions Treated to Date: 23
Plan Fractions Treated to Date: 23
Plan Fractions Treated to Date: 23
Plan Prescribed Dose Per Fraction: 1.8 Gy
Plan Prescribed Dose Per Fraction: 1.8 Gy
Plan Prescribed Dose Per Fraction: 1.8 Gy
Plan Total Fractions Prescribed: 28
Plan Total Fractions Prescribed: 28
Plan Total Fractions Prescribed: 28
Plan Total Prescribed Dose: 50.4 Gy
Plan Total Prescribed Dose: 50.4 Gy
Plan Total Prescribed Dose: 50.4 Gy
Reference Point Dosage Given to Date: 41.4 Gy
Reference Point Dosage Given to Date: 41.4 Gy
Reference Point Dosage Given to Date: 41.4 Gy
Reference Point Session Dosage Given: 1.8 Gy
Reference Point Session Dosage Given: 1.8 Gy
Reference Point Session Dosage Given: 1.8 Gy
Session Number: 23

## 2021-10-15 MED ORDER — LETROZOLE 2.5 MG PO TABS
2.5000 mg | ORAL_TABLET | Freq: Every day | ORAL | 3 refills | Status: DC
  Filled 2021-10-15 – 2021-12-04 (×2): qty 90, 90d supply, fill #0

## 2021-10-15 NOTE — Assessment & Plan Note (Signed)
12/03/2020: Bilateral breast biopsies: Right breast: T2N1 stage IIa grade 2 ILC, ER/PR positive, HER2 negative, Ki-67 40%, right axillary lymph node positive with extracapsular extension ?MammaPrint: High risk ?? ?01/27/2021 -06/08/2021 neoadjuvant chemotherapy with dose dense Adriamycin and Cytoxan x4 followed by Taxol x11 ?? 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 ?Treatment plan: ?1. Adjuvant radiation therapy 09/04/2021-10/30/2021 ?2. followed by adjuvant antiestrogen therapy to start 11/19/2021 ?? ?Letrozole counseling: We discussed the risks and benefits of anti-estrogen therapy with aromatase inhibitors. These include but not limited to insomnia, hot flashes, mood changes, vaginal dryness, bone density loss, and weight gain. We strongly believe that the benefits far outweigh the risks. Patient understands these risks and consented to starting treatment. Planned treatment duration is 5-7 years. ? ?Return to clinic in 3 months for survivorship care plan visit ?

## 2021-10-16 ENCOUNTER — Ambulatory Visit
Admission: RE | Admit: 2021-10-16 | Discharge: 2021-10-16 | Disposition: A | Discharge status: Home / Self Care | Payer: Medicaid Other | Source: Ambulatory Visit | Attending: Radiation Oncology | Admitting: Radiation Oncology

## 2021-10-16 ENCOUNTER — Other Ambulatory Visit: Payer: Self-pay

## 2021-10-16 ENCOUNTER — Telehealth: Payer: Self-pay | Admitting: Hematology and Oncology

## 2021-10-16 DIAGNOSIS — C50412 Malignant neoplasm of upper-outer quadrant of left female breast: Secondary | ICD-10-CM | POA: Diagnosis not present

## 2021-10-16 LAB — RAD ONC ARIA SESSION SUMMARY
Course Elapsed Days: 43
Plan Fractions Treated to Date: 24
Plan Fractions Treated to Date: 24
Plan Fractions Treated to Date: 24
Plan Prescribed Dose Per Fraction: 1.8 Gy
Plan Prescribed Dose Per Fraction: 1.8 Gy
Plan Prescribed Dose Per Fraction: 1.8 Gy
Plan Total Fractions Prescribed: 28
Plan Total Fractions Prescribed: 28
Plan Total Fractions Prescribed: 28
Plan Total Prescribed Dose: 50.4 Gy
Plan Total Prescribed Dose: 50.4 Gy
Plan Total Prescribed Dose: 50.4 Gy
Reference Point Dosage Given to Date: 43.2 Gy
Reference Point Dosage Given to Date: 43.2 Gy
Reference Point Dosage Given to Date: 43.2 Gy
Reference Point Session Dosage Given: 1.8 Gy
Reference Point Session Dosage Given: 1.8 Gy
Reference Point Session Dosage Given: 1.8 Gy
Session Number: 24

## 2021-10-16 NOTE — Telephone Encounter (Signed)
I called pt and informed her of my departure from [facility]. I referred pt to [facility] for therapy. Pt is still taking medication and goes to [facility] for psychiatry. Pt encouraged to call them to see when her next appt is. LCSW also provided pt with additional counseling resources.  ?

## 2021-10-16 NOTE — Telephone Encounter (Signed)
Per 4/27 los called and spoke to pt about appointment.  Pt confirmed appointment  ?

## 2021-10-19 ENCOUNTER — Encounter: Payer: Self-pay | Admitting: Licensed Clinical Social Worker

## 2021-10-19 ENCOUNTER — Telehealth: Payer: Self-pay | Admitting: Licensed Clinical Social Worker

## 2021-10-19 ENCOUNTER — Other Ambulatory Visit: Payer: Self-pay

## 2021-10-19 ENCOUNTER — Ambulatory Visit
Admission: RE | Admit: 2021-10-19 | Discharge: 2021-10-19 | Disposition: A | Discharge status: Home / Self Care | Payer: Medicaid Other | Source: Ambulatory Visit | Attending: Radiation Oncology | Admitting: Radiation Oncology

## 2021-10-19 ENCOUNTER — Ambulatory Visit: Payer: Medicaid Other

## 2021-10-19 DIAGNOSIS — Z510 Encounter for antineoplastic radiation therapy: Secondary | ICD-10-CM | POA: Insufficient documentation

## 2021-10-19 DIAGNOSIS — C50411 Malignant neoplasm of upper-outer quadrant of right female breast: Secondary | ICD-10-CM | POA: Diagnosis present

## 2021-10-19 LAB — RAD ONC ARIA SESSION SUMMARY
Course Elapsed Days: 46
Plan Fractions Treated to Date: 25
Plan Fractions Treated to Date: 25
Plan Fractions Treated to Date: 25
Plan Prescribed Dose Per Fraction: 1.8 Gy
Plan Prescribed Dose Per Fraction: 1.8 Gy
Plan Prescribed Dose Per Fraction: 1.8 Gy
Plan Total Fractions Prescribed: 28
Plan Total Fractions Prescribed: 28
Plan Total Fractions Prescribed: 28
Plan Total Prescribed Dose: 50.4 Gy
Plan Total Prescribed Dose: 50.4 Gy
Plan Total Prescribed Dose: 50.4 Gy
Reference Point Dosage Given to Date: 45 Gy
Reference Point Dosage Given to Date: 45 Gy
Reference Point Dosage Given to Date: 45 Gy
Reference Point Session Dosage Given: 1.8 Gy
Reference Point Session Dosage Given: 1.8 Gy
Reference Point Session Dosage Given: 1.8 Gy
Session Number: 25

## 2021-10-19 NOTE — Telephone Encounter (Signed)
[facility] Clinical Social Work ? ?TC from pt stating that she wants to know about any resources available. She was evicted last week and she and her son are staying in a hotel (a friend is helping pay). Pt has been out of work and does not have income. She is still in radiation treatment (scheduled to finish next week).   ? ?CSW discussed [employer] [employer]. Offered to do Vi-SPDAT with pt or provide contact information for [facility]- pt opted to call on her own, number provided. Will also give information for [employer]. ? ?For financial support, pt has already applied for [employer] in August 2022 and received [employer]. Will sign up for [employer] today. Pt has also received bags from [employer] and CSW will provide a list of additional food pantries today as well as local resources Naval architect, Solicitor, Social research officer, government). ? ? ?Christeen Douglas, LCSW ?

## 2021-10-19 NOTE — Progress Notes (Signed)
[facility] CSW Progress Note ? ?Clinical Social Worker met with patient. Signed up for and gave first [place] card. Provided bag from food pantry and list of local food pantries as well as information on local resources. Pt is waiting on determination from [employer] on disability. ? ?Pt stated she tried calling [facility] but the number did not connect. She agreed to have CSW send e-mail referral and will also try calling again herself. ? ?CSW will provide next [employer] card and another bag from food pantry next Monday. ? ? ?Christeen Douglas , LCSW ?

## 2021-10-20 ENCOUNTER — Ambulatory Visit: Payer: Medicaid Other | Admitting: Radiation Oncology

## 2021-10-20 ENCOUNTER — Other Ambulatory Visit: Payer: Self-pay

## 2021-10-20 ENCOUNTER — Ambulatory Visit: Payer: Medicaid Other

## 2021-10-20 ENCOUNTER — Encounter: Payer: Self-pay | Admitting: *Deleted

## 2021-10-20 ENCOUNTER — Ambulatory Visit
Admission: RE | Admit: 2021-10-20 | Discharge: 2021-10-20 | Disposition: A | Discharge status: Home / Self Care | Payer: Medicaid Other | Source: Ambulatory Visit | Attending: Radiation Oncology | Admitting: Radiation Oncology

## 2021-10-20 DIAGNOSIS — Z510 Encounter for antineoplastic radiation therapy: Secondary | ICD-10-CM | POA: Diagnosis not present

## 2021-10-20 DIAGNOSIS — Z170 Estrogen receptor positive status [ER+]: Secondary | ICD-10-CM

## 2021-10-20 LAB — RAD ONC ARIA SESSION SUMMARY
Course Elapsed Days: 47
Plan Fractions Treated to Date: 26
Plan Fractions Treated to Date: 26
Plan Fractions Treated to Date: 26
Plan Prescribed Dose Per Fraction: 1.8 Gy
Plan Prescribed Dose Per Fraction: 1.8 Gy
Plan Prescribed Dose Per Fraction: 1.8 Gy
Plan Total Fractions Prescribed: 28
Plan Total Fractions Prescribed: 28
Plan Total Fractions Prescribed: 28
Plan Total Prescribed Dose: 50.4 Gy
Plan Total Prescribed Dose: 50.4 Gy
Plan Total Prescribed Dose: 50.4 Gy
Reference Point Dosage Given to Date: 46.8 Gy
Reference Point Dosage Given to Date: 46.8 Gy
Reference Point Dosage Given to Date: 46.8 Gy
Reference Point Session Dosage Given: 1.8 Gy
Reference Point Session Dosage Given: 1.8 Gy
Reference Point Session Dosage Given: 1.8 Gy
Session Number: 26

## 2021-10-20 MED ORDER — SILVER SULFADIAZINE 1 % EX CREA
1.0000 "application " | TOPICAL_CREAM | Freq: Once | CUTANEOUS | Status: AC
  Administered 2021-10-20: 1 via TOPICAL

## 2021-10-20 MED ORDER — RADIAPLEXRX EX GEL
1.0000 "application " | Freq: Once | CUTANEOUS | Status: AC
  Administered 2021-10-20: 1 via TOPICAL

## 2021-10-21 ENCOUNTER — Other Ambulatory Visit: Payer: Self-pay

## 2021-10-21 ENCOUNTER — Ambulatory Visit
Admission: RE | Admit: 2021-10-21 | Discharge: 2021-10-21 | Disposition: A | Discharge status: Home / Self Care | Payer: Medicaid Other | Source: Ambulatory Visit | Attending: Radiation Oncology | Admitting: Radiation Oncology

## 2021-10-21 ENCOUNTER — Ambulatory Visit: Payer: Medicaid Other

## 2021-10-21 DIAGNOSIS — Z510 Encounter for antineoplastic radiation therapy: Secondary | ICD-10-CM | POA: Diagnosis not present

## 2021-10-21 LAB — RAD ONC ARIA SESSION SUMMARY
Course Elapsed Days: 48
Plan Fractions Treated to Date: 27
Plan Fractions Treated to Date: 27
Plan Fractions Treated to Date: 27
Plan Prescribed Dose Per Fraction: 1.8 Gy
Plan Prescribed Dose Per Fraction: 1.8 Gy
Plan Prescribed Dose Per Fraction: 1.8 Gy
Plan Total Fractions Prescribed: 28
Plan Total Fractions Prescribed: 28
Plan Total Fractions Prescribed: 28
Plan Total Prescribed Dose: 50.4 Gy
Plan Total Prescribed Dose: 50.4 Gy
Plan Total Prescribed Dose: 50.4 Gy
Reference Point Dosage Given to Date: 48.6 Gy
Reference Point Dosage Given to Date: 48.6 Gy
Reference Point Dosage Given to Date: 48.6 Gy
Reference Point Session Dosage Given: 1.8 Gy
Reference Point Session Dosage Given: 1.8 Gy
Reference Point Session Dosage Given: 1.8 Gy
Session Number: 27

## 2021-10-22 ENCOUNTER — Ambulatory Visit
Admission: RE | Admit: 2021-10-22 | Discharge: 2021-10-22 | Disposition: A | Discharge status: Home / Self Care | Payer: Medicaid Other | Source: Ambulatory Visit | Attending: Radiation Oncology | Admitting: Radiation Oncology

## 2021-10-22 ENCOUNTER — Other Ambulatory Visit: Payer: Self-pay

## 2021-10-22 ENCOUNTER — Ambulatory Visit: Payer: Medicaid Other

## 2021-10-22 DIAGNOSIS — Z510 Encounter for antineoplastic radiation therapy: Secondary | ICD-10-CM | POA: Diagnosis not present

## 2021-10-22 LAB — RAD ONC ARIA SESSION SUMMARY
Course Elapsed Days: 49
Plan Fractions Treated to Date: 28
Plan Fractions Treated to Date: 28
Plan Fractions Treated to Date: 28
Plan Prescribed Dose Per Fraction: 1.8 Gy
Plan Prescribed Dose Per Fraction: 1.8 Gy
Plan Prescribed Dose Per Fraction: 1.8 Gy
Plan Total Fractions Prescribed: 28
Plan Total Fractions Prescribed: 28
Plan Total Fractions Prescribed: 28
Plan Total Prescribed Dose: 50.4 Gy
Plan Total Prescribed Dose: 50.4 Gy
Plan Total Prescribed Dose: 50.4 Gy
Reference Point Dosage Given to Date: 50.4 Gy
Reference Point Dosage Given to Date: 50.4 Gy
Reference Point Dosage Given to Date: 50.4 Gy
Reference Point Session Dosage Given: 1.8 Gy
Reference Point Session Dosage Given: 1.8 Gy
Reference Point Session Dosage Given: 1.8 Gy
Session Number: 28

## 2021-10-23 ENCOUNTER — Ambulatory Visit: Payer: Medicaid Other

## 2021-10-26 ENCOUNTER — Other Ambulatory Visit: Payer: Self-pay

## 2021-10-26 ENCOUNTER — Ambulatory Visit
Admission: RE | Admit: 2021-10-26 | Discharge: 2021-10-26 | Disposition: A | Discharge status: Home / Self Care | Payer: Medicaid Other | Source: Ambulatory Visit | Attending: Radiation Oncology | Admitting: Radiation Oncology

## 2021-10-26 ENCOUNTER — Ambulatory Visit: Payer: Medicaid Other

## 2021-10-26 DIAGNOSIS — Z510 Encounter for antineoplastic radiation therapy: Secondary | ICD-10-CM | POA: Diagnosis not present

## 2021-10-26 LAB — RAD ONC ARIA SESSION SUMMARY
Course Elapsed Days: 53
Plan Fractions Treated to Date: 1
Plan Fractions Treated to Date: 1
Plan Prescribed Dose Per Fraction: 2 Gy
Plan Prescribed Dose Per Fraction: 2 Gy
Plan Total Fractions Prescribed: 5
Plan Total Fractions Prescribed: 6
Plan Total Prescribed Dose: 10 Gy
Plan Total Prescribed Dose: 12 Gy
Reference Point Dosage Given to Date: 52.4 Gy
Reference Point Dosage Given to Date: 52.4 Gy
Reference Point Session Dosage Given: 2 Gy
Reference Point Session Dosage Given: 2 Gy
Session Number: 29

## 2021-10-27 ENCOUNTER — Ambulatory Visit: Payer: Medicaid Other

## 2021-10-27 ENCOUNTER — Other Ambulatory Visit (HOSPITAL_COMMUNITY): Payer: Self-pay

## 2021-10-27 ENCOUNTER — Other Ambulatory Visit: Payer: Self-pay

## 2021-10-27 ENCOUNTER — Other Ambulatory Visit (INDEPENDENT_AMBULATORY_CARE_PROVIDER_SITE_OTHER): Payer: Self-pay | Admitting: Primary Care

## 2021-10-27 ENCOUNTER — Other Ambulatory Visit: Payer: Self-pay | Admitting: Student in an Organized Health Care Education/Training Program

## 2021-10-27 ENCOUNTER — Ambulatory Visit
Admission: RE | Admit: 2021-10-27 | Discharge: 2021-10-27 | Disposition: A | Discharge status: Home / Self Care | Payer: Medicaid Other | Source: Ambulatory Visit | Attending: Radiation Oncology | Admitting: Radiation Oncology

## 2021-10-27 DIAGNOSIS — Z510 Encounter for antineoplastic radiation therapy: Secondary | ICD-10-CM | POA: Diagnosis not present

## 2021-10-27 LAB — RAD ONC ARIA SESSION SUMMARY
Course Elapsed Days: 54
Plan Fractions Treated to Date: 2
Plan Fractions Treated to Date: 2
Plan Prescribed Dose Per Fraction: 2 Gy
Plan Prescribed Dose Per Fraction: 2 Gy
Plan Total Fractions Prescribed: 5
Plan Total Fractions Prescribed: 6
Plan Total Prescribed Dose: 10 Gy
Plan Total Prescribed Dose: 12 Gy
Reference Point Dosage Given to Date: 54.4 Gy
Reference Point Dosage Given to Date: 54.4 Gy
Reference Point Session Dosage Given: 2 Gy
Reference Point Session Dosage Given: 2 Gy
Session Number: 30

## 2021-10-28 ENCOUNTER — Ambulatory Visit
Admission: RE | Admit: 2021-10-28 | Discharge: 2021-10-28 | Disposition: A | Discharge status: Home / Self Care | Payer: Medicaid Other | Source: Ambulatory Visit | Attending: Radiation Oncology | Admitting: Radiation Oncology

## 2021-10-28 ENCOUNTER — Other Ambulatory Visit: Payer: Self-pay

## 2021-10-28 ENCOUNTER — Ambulatory Visit: Payer: Medicaid Other

## 2021-10-28 DIAGNOSIS — C50411 Malignant neoplasm of upper-outer quadrant of right female breast: Secondary | ICD-10-CM

## 2021-10-28 DIAGNOSIS — Z170 Estrogen receptor positive status [ER+]: Secondary | ICD-10-CM

## 2021-10-28 DIAGNOSIS — Z510 Encounter for antineoplastic radiation therapy: Secondary | ICD-10-CM | POA: Diagnosis not present

## 2021-10-28 LAB — RAD ONC ARIA SESSION SUMMARY
Course Elapsed Days: 55
Plan Fractions Treated to Date: 3
Plan Fractions Treated to Date: 3
Plan Prescribed Dose Per Fraction: 2 Gy
Plan Prescribed Dose Per Fraction: 2 Gy
Plan Total Fractions Prescribed: 5
Plan Total Fractions Prescribed: 6
Plan Total Prescribed Dose: 10 Gy
Plan Total Prescribed Dose: 12 Gy
Reference Point Dosage Given to Date: 56.4 Gy
Reference Point Dosage Given to Date: 56.4 Gy
Reference Point Session Dosage Given: 2 Gy
Reference Point Session Dosage Given: 2 Gy
Session Number: 31

## 2021-10-28 MED ORDER — SILVER SULFADIAZINE 1 % EX CREA: TOPICAL_CREAM | Freq: Two times a day (BID) | CUTANEOUS | Status: DC

## 2021-10-29 ENCOUNTER — Other Ambulatory Visit: Payer: Self-pay

## 2021-10-29 ENCOUNTER — Other Ambulatory Visit (HOSPITAL_COMMUNITY): Payer: Self-pay

## 2021-10-29 ENCOUNTER — Ambulatory Visit: Payer: Medicaid Other

## 2021-10-29 ENCOUNTER — Ambulatory Visit (INDEPENDENT_AMBULATORY_CARE_PROVIDER_SITE_OTHER): Payer: Medicaid Other | Admitting: Primary Care

## 2021-10-29 ENCOUNTER — Encounter: Payer: Self-pay | Admitting: *Deleted

## 2021-10-29 ENCOUNTER — Ambulatory Visit
Admission: RE | Admit: 2021-10-29 | Discharge: 2021-10-29 | Disposition: A | Discharge status: Home / Self Care | Payer: Medicaid Other | Source: Ambulatory Visit | Attending: Radiation Oncology | Admitting: Radiation Oncology

## 2021-10-29 DIAGNOSIS — Z510 Encounter for antineoplastic radiation therapy: Secondary | ICD-10-CM | POA: Diagnosis not present

## 2021-10-29 LAB — RAD ONC ARIA SESSION SUMMARY
Course Elapsed Days: 56
Plan Fractions Treated to Date: 4
Plan Fractions Treated to Date: 4
Plan Prescribed Dose Per Fraction: 2 Gy
Plan Prescribed Dose Per Fraction: 2 Gy
Plan Total Fractions Prescribed: 5
Plan Total Fractions Prescribed: 6
Plan Total Prescribed Dose: 10 Gy
Plan Total Prescribed Dose: 12 Gy
Reference Point Dosage Given to Date: 58.4 Gy
Reference Point Dosage Given to Date: 58.4 Gy
Reference Point Session Dosage Given: 2 Gy
Reference Point Session Dosage Given: 2 Gy
Session Number: 32

## 2021-10-30 ENCOUNTER — Other Ambulatory Visit: Payer: Self-pay

## 2021-10-30 ENCOUNTER — Ambulatory Visit: Payer: Medicaid Other

## 2021-10-30 ENCOUNTER — Ambulatory Visit
Admission: RE | Admit: 2021-10-30 | Discharge: 2021-10-30 | Disposition: A | Discharge status: Home / Self Care | Payer: Medicaid Other | Source: Ambulatory Visit | Attending: Radiation Oncology | Admitting: Radiation Oncology

## 2021-10-30 DIAGNOSIS — Z510 Encounter for antineoplastic radiation therapy: Secondary | ICD-10-CM | POA: Diagnosis not present

## 2021-10-30 LAB — RAD ONC ARIA SESSION SUMMARY
Course Elapsed Days: 57
Plan Fractions Treated to Date: 5
Plan Fractions Treated to Date: 5
Plan Prescribed Dose Per Fraction: 2 Gy
Plan Prescribed Dose Per Fraction: 2 Gy
Plan Total Fractions Prescribed: 5
Plan Total Fractions Prescribed: 6
Plan Total Prescribed Dose: 10 Gy
Plan Total Prescribed Dose: 12 Gy
Reference Point Dosage Given to Date: 60.4 Gy
Reference Point Dosage Given to Date: 60.4 Gy
Reference Point Session Dosage Given: 2 Gy
Reference Point Session Dosage Given: 2 Gy
Session Number: 33

## 2021-11-02 ENCOUNTER — Other Ambulatory Visit: Payer: Self-pay

## 2021-11-02 ENCOUNTER — Ambulatory Visit
Admission: RE | Admit: 2021-11-02 | Discharge: 2021-11-02 | Disposition: A | Discharge status: Home / Self Care | Payer: Medicaid Other | Source: Ambulatory Visit | Attending: Radiation Oncology | Admitting: Radiation Oncology

## 2021-11-02 DIAGNOSIS — Z510 Encounter for antineoplastic radiation therapy: Secondary | ICD-10-CM | POA: Diagnosis not present

## 2021-11-02 LAB — RAD ONC ARIA SESSION SUMMARY
Course Elapsed Days: 60
Plan Fractions Treated to Date: 6
Plan Prescribed Dose Per Fraction: 2 Gy
Plan Total Fractions Prescribed: 6
Plan Total Prescribed Dose: 12 Gy
Reference Point Dosage Given to Date: 62.4 Gy
Reference Point Session Dosage Given: 2 Gy
Session Number: 34

## 2021-11-03 ENCOUNTER — Other Ambulatory Visit (INDEPENDENT_AMBULATORY_CARE_PROVIDER_SITE_OTHER): Payer: Self-pay | Admitting: Primary Care

## 2021-11-03 DIAGNOSIS — I10 Essential (primary) hypertension: Secondary | ICD-10-CM

## 2021-11-03 NOTE — Telephone Encounter (Signed)
Medication Refill - Medication:  ?albuterol (VENTOLIN HFA) 108 (90 Base) MCG/ACT inhaler  ?Cholecalciferol (VITAMIN D3) 50 MCG (2000 UT) capsule ? ? ?Has the patient contacted their pharmacy? Yes ?Contact PCP ? ?Preferred Pharmacy (with phone number or street name):  ?[facility]  ?[address] [place] [address]  ?Phone:  [phone]  Fax:  [phone] ? ?Has the patient been seen for an appointment in the last year OR does the patient have an upcoming appointment? Yes.   ? ?Agent: Please be advised that RX refills may take up to 3 business days. We ask that you follow-up with your pharmacy. ?

## 2021-11-04 ENCOUNTER — Other Ambulatory Visit (HOSPITAL_COMMUNITY): Payer: Self-pay

## 2021-11-04 MED ORDER — ALBUTEROL SULFATE HFA 108 (90 BASE) MCG/ACT IN AERS
2.0000 | INHALATION_SPRAY | Freq: Four times a day (QID) | RESPIRATORY_TRACT | 2 refills | Status: AC | PRN
Start: 2021-11-04 — End: ?
  Filled 2021-11-04 – 2021-11-23 (×2): qty 18, 25d supply, fill #0

## 2021-11-04 NOTE — Telephone Encounter (Signed)
Requested medication (s) are due for refill today: Yes ? ?Requested medication (s) are on the active medication list: Yes ? ?Last refill:  Albuterol - 10/06/20 ? ?Future visit scheduled: No ? ?Notes to clinic:  Albuterol has expired. Vit D not on medication list. ? ? ? ?Requested Prescriptions  ?Pending Prescriptions Disp Refills  ? albuterol (VENTOLIN HFA) 108 (90 Base) MCG/ACT inhaler 18 g 2  ?  Sig: Inhale 2 puffs into the lungs every 6 (six) hours as needed for wheezing or shortness of breath.  ?  ? Pulmonology:  Beta Agonists 2 Failed - 11/03/2021  5:27 PM  ?  ?  Failed - Last BP in normal range  ?  BP Readings from Last 1 Encounters:  ?10/15/21 (!) 157/90  ?   ?  ?  Passed - Last Heart Rate in normal range  ?  Pulse Readings from Last 1 Encounters:  ?10/15/21 64  ?   ?  ?  Passed - Valid encounter within last 12 months  ?  Recent Outpatient Visits   ? ?      ? 2 months ago Essential hypertension, benign  ? [facility] [name], NP  ? 3 months ago Essential hypertension, benign  ? [facility] [name], NP  ? 3 months ago Essential hypertension, benign  ? [facility] [name], NP  ? 8 months ago Chronic pain syndrome  ? [facility] [name], NP  ? 1 year ago Moderate persistent asthma with acute exacerbation  ? [facility] [name], NP  ? ?  ?  ? ? ?  ?  ?  ? Cholecalciferol (VITAMIN D3) 50 MCG (2000 UT) capsule  11  ?  Sig: Take 1 capsule (2,000 Units total) by mouth daily.  ?  ? Endocrinology:  Vitamins - Vitamin D Supplementation 2 Failed - 11/03/2021  5:27 PM  ?  ?  Failed - Manual Review: Route requests for 50,000 IU strength to the provider  ?  ?  Failed - Vitamin D in normal range and within 360 days  ?  No results found for: [id number], [id number], [id number], [place], [place], [place], [place], [place], [place], [place],  VD25OH   ?  ?  Passed - Ca in normal range and within 360 days  ?  Calcium  ?Date Value Ref Range Status  ?06/08/2021 8.9 8.9 - 10.3 mg/dL Final  ?   ?  ?  Passed - Valid encounter within last 12 months  ?  Recent Outpatient Visits   ? ?      ? 2 months ago Essential hypertension, benign  ? [facility] [name], NP  ? 3 months ago Essential hypertension, benign  ? [facility] [name], NP  ? 3 months ago Essential hypertension, benign  ? [facility] [name], NP  ? 8 months ago Chronic pain syndrome  ? [facility] [name], NP  ? 1 year ago Moderate persistent asthma with acute exacerbation  ? [facility] [name], NP  ? ?  ?  ? ? ?  ?  ?  ? ?

## 2021-11-14 ENCOUNTER — Other Ambulatory Visit (HOSPITAL_COMMUNITY): Payer: Self-pay

## 2021-11-19 ENCOUNTER — Other Ambulatory Visit (HOSPITAL_COMMUNITY): Payer: Self-pay

## 2021-11-19 ENCOUNTER — Ambulatory Visit
Payer: Medicaid Other | Attending: Student in an Organized Health Care Education/Training Program | Admitting: Student in an Organized Health Care Education/Training Program

## 2021-11-19 ENCOUNTER — Encounter: Payer: Self-pay | Admitting: Student in an Organized Health Care Education/Training Program

## 2021-11-19 VITALS — BP 170/89 | HR 66 | Resp 18 | Ht 71.0 in | Wt 215.0 lb

## 2021-11-19 DIAGNOSIS — E669 Obesity, unspecified: Secondary | ICD-10-CM | POA: Insufficient documentation

## 2021-11-19 DIAGNOSIS — C50412 Malignant neoplasm of upper-outer quadrant of left female breast: Secondary | ICD-10-CM | POA: Insufficient documentation

## 2021-11-19 DIAGNOSIS — Z0289 Encounter for other administrative examinations: Secondary | ICD-10-CM | POA: Diagnosis not present

## 2021-11-19 DIAGNOSIS — Z170 Estrogen receptor positive status [ER+]: Secondary | ICD-10-CM | POA: Insufficient documentation

## 2021-11-19 DIAGNOSIS — G894 Chronic pain syndrome: Secondary | ICD-10-CM | POA: Diagnosis not present

## 2021-11-19 DIAGNOSIS — C50411 Malignant neoplasm of upper-outer quadrant of right female breast: Secondary | ICD-10-CM | POA: Diagnosis not present

## 2021-11-19 MED ORDER — OXYCODONE-ACETAMINOPHEN 5-325 MG PO TABS
1.0000 | ORAL_TABLET | Freq: Four times a day (QID) | ORAL | 0 refills | Status: DC | PRN
  Filled 2021-12-22: qty 90, 23d supply, fill #0

## 2021-11-19 MED ORDER — OXYCODONE-ACETAMINOPHEN 5-325 MG PO TABS
1.0000 | ORAL_TABLET | Freq: Four times a day (QID) | ORAL | 0 refills | Status: AC | PRN
  Filled 2021-11-23: qty 90, 23d supply, fill #0

## 2021-11-19 MED ORDER — OXYCODONE-ACETAMINOPHEN 5-325 MG PO TABS
1.0000 | ORAL_TABLET | Freq: Four times a day (QID) | ORAL | 0 refills | Status: DC | PRN
  Filled 2022-01-22: qty 90, 23d supply, fill #0

## 2021-11-19 NOTE — Patient Instructions (Signed)
Ok to take Aleve in between oxycodone  Be sure to take a stool softener, Dulcolax to help keep your BM's soft

## 2021-11-19 NOTE — Progress Notes (Signed)
Nursing Pain Medication Assessment:  Safety precautions to be maintained throughout the outpatient stay will include: orient to surroundings, keep bed in low position, maintain call bell within reach at all times, provide assistance with transfer out of bed and ambulation.  Medication Inspection Compliance: Pill count conducted under aseptic conditions, in front of the patient. Neither the pills nor the bottle was removed from the patient's sight at any time. Once count was completed pills were immediately returned to the patient in their original bottle.  Medication: Oxycodone/APAP Pill/Patch Count:  15 of 90 pills remain Pill/Patch Appearance: Markings consistent with prescribed medication Bottle Appearance: Standard pharmacy container. Clearly labeled. Filled Date: 05 / 11 / 2023 Last Medication intake:  Today

## 2021-11-19 NOTE — Progress Notes (Signed)
PROVIDER NOTE: Information contained herein reflects review and annotations entered in association with encounter. Interpretation of such information and data should be left to medically-trained personnel. Information provided to patient can be located elsewhere in the medical record under "Patient Instructions". Document created using STT-dictation technology, any transcriptional errors that may result from process are unintentional.    Patient: Chelsea Hansen  Service Category: E/M  Provider: Gillis Santa, MD  DOB: 1970-10-23  DOS: 11/19/2021  Specialty: Interventional Pain Management  MRN: [id number]  Setting: Ambulatory outpatient  PCP: [name], NP  Type: Established Patient    Referring Provider: [name], NP  Location: Office  Delivery: Face-to-face     HPI  [name], a 51 y.o. year old female, is here today because of her Chronic pain syndrome [G89.4]. [name] primary complain today is Hip Pain (bilateral), Back Pain (low), Chest Pain, and axillary Last encounter: My last encounter with her was on 09/29/2021 Pertinent problems: [name] has Malignant neoplasm of upper-outer quadrant of right breast in female, estrogen receptor positive ([place]); Malignant neoplasm of upper-outer quadrant of left breast in female, estrogen receptor positive ([place]); Obesity (BMI 30-39.9); Pain management contract signed; and Chronic pain syndrome (breast cancer) on their pertinent problem list. Pain Assessment: Severity of Chronic pain is reported as a 10-Worst pain ever/10. Location: Hip Right, Left/radiates down both legs to knees. Onset: More than a month ago. Quality: Aching, Sharp, Stabbing, Shooting. Timing: Constant. Modifying factor(s): medications. Vitals:  height is _0  (1.803 m) and weight is 215 lb (97.5 kg). Her blood pressure is 170/89 (abnormal) and her pulse is 66. Her respiration is 18 and oxygen saturation is 100%.   Reason for encounter: medication management.     -Patient is status post bilateral breast lumpectomy for breast cancer done on 07/09/2021.  -She is receiving radiation therapy for her breast cancer.  She states that she is having increased bony pain as a result of her radiation therapy.  She hopes to be done with that next week.  She is at her last treatment rounds. -Is taking oxycodone more frequently, every 6 hours and is requesting a prescription to reflect that.  I informed her that no further dose escalation beyond this especially since she will be stopping with her radiation therapy at which point we will likely reduce her back down 5 mg every 8 hours as needed  Pharmacotherapy Assessment  Analgesic:  Percocet 5 mg every 8 hours as needed--> changed to every 6 hours as needed as the patient is currently receiving radiation therapy and is encountering increased acute pain.  Monitoring: [place] PMP: PDMP reviewed during this encounter.       Pharmacotherapy: No side-effects or adverse reactions reported. Compliance: No problems identified. Effectiveness: Clinically acceptable.  Dewayne Shorter, RN  11/19/2021  9:55 AM  Sign when Signing Visit Nursing Pain Medication Assessment:  Safety precautions to be maintained throughout the outpatient stay will include: orient to surroundings, keep bed in low position, maintain call bell within reach at all times, provide assistance with transfer out of bed and ambulation.  Medication Inspection Compliance: Pill count conducted under aseptic conditions, in front of the patient. Neither the pills nor the bottle was removed from the patient's sight at any time. Once count was completed pills were immediately returned to the patient in their original bottle.  Medication: Oxycodone/APAP Pill/Patch Count:  15 of 90 pills remain Pill/Patch Appearance: Markings consistent with prescribed medication Bottle Appearance: Standard  pharmacy container. Clearly labeled. Filled Date: 05 / 11 / 2023 Last Medication  intake:  Today     UDS:  Summary  Date Value Ref Range Status  04/08/2021 Note  Final    Comment:    ==================================================================== Compliance Drug Analysis, Ur ==================================================================== Test                             Result       Flag       Units  Drug Present not Declared for Prescription Verification   Diphenhydramine                PRESENT      UNEXPECTED  Drug Absent but Declared for Prescription Verification   Gabapentin                     Not Detected UNEXPECTED   Citalopram                     Not Detected UNEXPECTED   Acetaminophen                  Not Detected UNEXPECTED    Acetaminophen, as indicated in the declared medication list, is not    always detected even when used as directed.  ==================================================================== Test                      Result    Flag   Units      Ref Range   Creatinine              133              mg/dL      >=20 ==================================================================== Declared Medications:  The flagging and interpretation on this report are based on the  following declared medications.  Unexpected results may arise from  inaccuracies in the declared medications.   **Note: The testing scope of this panel includes these medications:   Escitalopram (Lexapro)  Gabapentin (Neurontin)   **Note: The testing scope of this panel does not include small to  moderate amounts of these reported medications:   Acetaminophen (Tylenol)   **Note: The testing scope of this panel does not include the  following reported medications:   Albuterol (Ventolin HFA)  Amlodipine (Norvasc)  Fluticasone (Flonase)  Hydrochlorothiazide  Loratadine (Claritin)  Montelukast (Singulair)  Multivitamin  Omeprazole (Prilosec)  Vitamin D3 ==================================================================== For clinical consultation,  please call [phone]. ====================================================================      ROS  Constitutional: Denies any fever or chills Gastrointestinal: No reported hemesis, hematochezia, vomiting, or acute GI distress Musculoskeletal:  Diffuse whole body pain, low back bilateral hip pain Neurological: No reported episodes of acute onset apraxia, aphasia, dysarthria, agnosia, amnesia, paralysis, loss of coordination, or loss of consciousness  Medication Review  ARIPiprazole, Vitamin D3, albuterol, amLODipine, dexamethasone, escitalopram, fluticasone, hydrochlorothiazide, letrozole, montelukast, oxyCODONE-acetaminophen, propranolol, and valsartan  History Review  Allergy: Ms. Zecca has No Known Allergies. Drug: Ms. Yogi  reports no history of drug use. Alcohol:  reports that she does not currently use alcohol after a past usage of about 7.0 standard drinks per week. Tobacco:  reports that she has never smoked. She has never used smokeless tobacco. Social: Ms. Boldin  reports that she has never smoked. She has never used smokeless tobacco. She reports that she does not currently use alcohol after a past usage of about 7.0 standard drinks per  week. She reports that she does not use drugs. Medical:  has a past medical history of Allergy, Anxiety, Asthma, Breast cancer ([place]) (2012), Cancer ([place]) (2022), Depression, DVT (deep venous thrombosis) ([place]) (2010), Family history of uterine cancer, GERD (gastroesophageal reflux disease), Headache(784.0), Hyperlipidemia, Hypertension, and SVT (supraventricular tachycardia) ([place]). Surgical: Ms. Markus  has a past surgical history that includes Abdominal hysterectomy; Breast surgery; Tubal ligation; Breast excisional biopsy (Right, 09/16/2009); Portacath placement (Left, 01/26/2021); Breast lumpectomy with radioactive seed localization (Bilateral, 07/09/2021); Radioactive seed guided axillary sentinel lymph node (Right, 07/09/2021); Axillary sentinel node  biopsy (Left, 07/09/2021); and Port-a-cath removal (Left, 07/09/2021). Family: family history includes Cancer in her maternal aunt, maternal aunt, and mother; Hypertension in her father; Uterine cancer (age of onset: 60) in her cousin.  Laboratory Chemistry Profile   Renal Lab Results  Component Value Date   BUN 13 06/08/2021   CREATININE 0.75 06/08/2021   BCR 15 10/06/2020   GFRAA >60 09/07/2019   GFRNONAA >60 06/08/2021    Hepatic Lab Results  Component Value Date   AST 16 06/08/2021   ALT 20 06/08/2021   ALBUMIN 3.6 06/08/2021   ALKPHOS 84 06/08/2021   LIPASE 21 01/28/2021    Electrolytes Lab Results  Component Value Date   NA 140 06/08/2021   K 3.6 06/08/2021   CL 107 06/08/2021   CALCIUM 8.9 06/08/2021   MG 2.0 01/28/2021    Bone No results found for: VD25OH, VD125OH2TOT, ZO1096EA5, WU9811BJ4, 25OHVITD1, 25OHVITD2, 25OHVITD3, TESTOFREE, TESTOSTERONE  Inflammation (CRP: Acute Phase) (ESR: Chronic Phase) Lab Results  Component Value Date   ESRSEDRATE 11 12/31/2011   LATICACIDVEN 0.87 11/01/2013         Note: Above Lab results reviewed.  Recent Imaging Review  [place] RT RADIOACTIVE SEED EA ADD LESION CLINICAL DATA:  Patient presents for preoperative radioactive seed localization of biopsy proven malignancy in the right breast at the 10 o'clock position and biopsy proven metastatic lymph node in the right axilla.  EXAM: ULTRASOUND GUIDED RADIOACTIVE SEED LOCALIZATION OF THE RIGHT BREAST  COMPARISON:  Previous exam(s).  FINDINGS: Patient presents for radioactive seed localization prior to right breast lumpectomy. I met with the patient and we discussed the procedure of seed localization including benefits and alternatives. We discussed the high likelihood of a successful procedure. We discussed the risks of the procedure including infection, bleeding, tissue injury and further surgery. We discussed the low dose of radioactivity involved in the procedure.  Informed, written consent was given.  The usual time-out protocol was performed immediately prior to the procedure.  SITE 1: RIGHT BREAST 10 O'CLOCK: Using ultrasound guidance, sterile technique, 1% lidocaine and an I-125 radioactive seed, the mass in the right breast at the 10 o'clock position was localized using a lateral to medial approach. The follow-up mammogram images confirm the seed in the expected location and were marked for Dr. Ninfa Linden.  Follow-up survey of the patient confirms presence of the radioactive seed.  Order number of I-125 seed:  [id number].  Total activity:  0.865 millicuries reference Date: 04/20/2021  SITE 2: RIGHT AXILLA: Using ultrasound guidance, sterile technique, 1% lidocaine and an I-125 radioactive seed, the abnormal lymph node containing the tri bell biopsy marking clip was localized using a lateral to medial approach. The follow-up mammogram images confirm the seed in the expected location and were marked for Dr. Ninfa Linden.  Follow-up survey of the patient confirms presence of the radioactive seed.  Order number of I-125 seed:  [id number].  Total activity:  0.244  millicuries reference Date: 06/10/2021  The patient tolerated the procedure well and was released from the [place]. She was given instructions regarding seed removal.  IMPRESSION: Radioactive seed localization right breast 10 o'clock and right axilla.  Electronically Signed   By: Everlean Alstrom M.D.   On: 07/09/2021 10:31 MM Breast Surgical Specimen CLINICAL DATA:  Evaluate 3 specimens  EXAM: SPECIMEN RADIOGRAPH OF THE BILATERAL BREAST  COMPARISON:  Previous exam(s).  FINDINGS: Status post excision of the bilateral breasts. For the right specimens, both the radioactive seeds and biopsy clips are in the specimen. On the left, the seed was presented in a specimen container.  IMPRESSION: Specimen radiograph of the bilateral breasts.  Electronically Signed   By:  Dorise Bullion III M.D.   On: 07/09/2021 10:24 MM Breast Surgical Specimen CLINICAL DATA:  Evaluate 3 specimens  EXAM: SPECIMEN RADIOGRAPH OF THE BILATERAL BREAST  COMPARISON:  Previous exam(s).  FINDINGS: Status post excision of the bilateral breasts. For the right specimens, both the radioactive seeds and biopsy clips are in the specimen. On the left, the seed was presented in a specimen container.  IMPRESSION: Specimen radiograph of the bilateral breasts.  Electronically Signed   By: Dorise Bullion III M.D.   On: 07/09/2021 10:24 MM Breast Surgical Specimen CLINICAL DATA:  Evaluate 3 specimens  EXAM: SPECIMEN RADIOGRAPH OF THE BILATERAL BREAST  COMPARISON:  Previous exam(s).  FINDINGS: Status post excision of the bilateral breasts. For the right specimens, both the radioactive seeds and biopsy clips are in the specimen. On the left, the seed was presented in a specimen container.  IMPRESSION: Specimen radiograph of the bilateral breasts.  Electronically Signed   By: Dorise Bullion III M.D.   On: 07/09/2021 10:24 Note: Reviewed        Physical Exam  General appearance: Well nourished, well developed, and well hydrated. In no apparent acute distress Mental status: Alert, oriented x 3 (person, place, & time)       Respiratory: No evidence of acute respiratory distress Eyes: PERLA Vitals: BP (!) 170/89   Pulse 66   Resp 18   Ht _0  (1.803 m)   Wt 215 lb (97.5 kg)   SpO2 100%   BMI 29.99 kg/m  BMI: Estimated body mass index is 29.99 kg/m as calculated from the following:   Height as of this encounter: _1  (1.803 m).   Weight as of this encounter: 215 lb (97.5 kg). Ideal: Ideal body weight: 70.8 kg (156 lb 1.4 oz) Adjusted ideal body weight: 81.5 kg (179 lb 10.4 oz)  Diffuse myalgias and arthralgias all over  Assessment   Status Diagnosis  Controlled Controlled Controlled 1. Chronic pain syndrome (breast cancer)   2. Pain management  contract signed   3. Malignant neoplasm of upper-outer quadrant of right breast in female, estrogen receptor positive ([place])   4. Malignant neoplasm of upper-outer quadrant of left breast in female, estrogen receptor positive ([place])   5. Obesity (BMI 30-39.9)       Plan of Care    Ms. ZARAYAH LANTING has a current medication list which includes the following long-term medication(s): albuterol, amlodipine, aripiprazole, escitalopram, fluticasone, hydrochlorothiazide, montelukast, propranolol, and valsartan.  Pharmacotherapy (Medications Ordered): Meds ordered this encounter  Medications   oxyCODONE-acetaminophen (PERCOCET) 5-325 MG tablet    Sig: Take 1 tablet by mouth every 6 (six) hours as needed for severe pain.  **11/22/2021    Dispense:  90 tablet    Refill:  0  Chronic Pain: STOP Act (Not applicable) Fill 1 day early if closed on refill date. Avoid benzodiazepines within 8 hours of opioids   oxyCODONE-acetaminophen (PERCOCET) 5-325 MG tablet    Sig: Take 1 tablet by mouth every 6 (six) hours as needed for severe pain.  **12/22/2021    Dispense:  90 tablet    Refill:  0    Chronic Pain: STOP Act (Not applicable) Fill 1 day early if closed on refill date. Avoid benzodiazepines within 8 hours of opioids   oxyCODONE-acetaminophen (PERCOCET) 5-325 MG tablet    Sig: Take 1 tablet by mouth every 6 (six) hours as needed for severe pain.  **01/21/2022    Dispense:  90 tablet    Refill:  0    Chronic Pain: STOP Act (Not applicable) Fill 1 day early if closed on refill date. Avoid benzodiazepines within 8 hours of opioids   Continue gabapentin 300 mg 3 times daily Recommend addition of Dulcolax Kidney function within normal limits, okay to take Aleve 400 to 600 mg every 8 hours as needed.  Follow-up plan:   Return in about 3 months (around 02/19/2022) for Medication Management, in person.    Recent Visits Date Type Provider Dept  09/29/21 Office Visit Gillis Santa, MD [facility]  Showing recent visits within past 90 days and meeting all other requirements Today's Visits Date Type Provider Dept  11/19/21 Office Visit Gillis Santa, MD [facility]  Showing today's visits and meeting all other requirements Future Appointments Date Type Provider Dept  02/11/22 Appointment Gillis Santa, MD [facility]  Showing future appointments within next 90 days and meeting all other requirements  I discussed the assessment and treatment plan with the patient. The patient was provided an opportunity to ask questions and all were answered. The patient agreed with the plan and demonstrated an understanding of the instructions.  Patient advised to call back or seek an in-person evaluation if the symptoms or condition worsens.  Duration of encounter: 89mnutes.  Note by: BGillis Santa MD Date: 11/19/2021; Time: 11:02 AM

## 2021-11-23 ENCOUNTER — Other Ambulatory Visit (HOSPITAL_COMMUNITY): Payer: Self-pay

## 2021-11-23 ENCOUNTER — Encounter: Payer: Self-pay | Admitting: Oncology

## 2021-12-04 ENCOUNTER — Other Ambulatory Visit (HOSPITAL_COMMUNITY): Payer: Self-pay

## 2021-12-07 ENCOUNTER — Encounter: Payer: Self-pay | Admitting: Oncology

## 2021-12-07 ENCOUNTER — Telehealth: Payer: Self-pay | Admitting: *Deleted

## 2021-12-07 ENCOUNTER — Other Ambulatory Visit (HOSPITAL_COMMUNITY): Payer: Self-pay

## 2021-12-07 NOTE — Telephone Encounter (Signed)
Received call from pt with complaint of cold chills, sweats, and nausea x4 days.  Pt denies fever at this time.  Per MD pt needing to take home Covid test.  If negative, pt will be scheduled to f/u in [facility].  Pt educated to take Covid test today and alert office of results.  Pt verbalized understanding.

## 2021-12-10 ENCOUNTER — Other Ambulatory Visit (HOSPITAL_COMMUNITY): Payer: Self-pay

## 2021-12-15 ENCOUNTER — Encounter: Payer: Self-pay | Admitting: *Deleted

## 2021-12-16 ENCOUNTER — Other Ambulatory Visit (HOSPITAL_COMMUNITY): Payer: Self-pay

## 2021-12-21 ENCOUNTER — Other Ambulatory Visit (HOSPITAL_COMMUNITY): Payer: Self-pay

## 2021-12-23 ENCOUNTER — Other Ambulatory Visit (HOSPITAL_COMMUNITY): Payer: Self-pay

## 2022-01-11 ENCOUNTER — Telehealth: Payer: Self-pay | Admitting: *Deleted

## 2022-01-11 ENCOUNTER — Other Ambulatory Visit: Payer: Self-pay

## 2022-01-11 DIAGNOSIS — Z170 Estrogen receptor positive status [ER+]: Secondary | ICD-10-CM

## 2022-01-11 NOTE — Telephone Encounter (Signed)
Received call from pt with complaint of bilateral breast milk like consistency nipple discharge x 3 days.  Pt denies recent injury, trauma, redness or swelling.  Pt states she is currently out of the stated and will return later this week.  Pt is already scheduled f/u with NP.  Per MD labs needing to be added prior to visit. Pt states there is no chance of pregnancy but would like a pregnancy test to confirm.  Pt educated to contact the office if fever develops prior to appt, pt verbalized understanding.

## 2022-01-14 ENCOUNTER — Inpatient Hospital Stay: Payer: Medicaid Other | Admitting: Licensed Clinical Social Worker

## 2022-01-14 ENCOUNTER — Inpatient Hospital Stay: Payer: Medicaid Other | Attending: Adult Health | Admitting: Adult Health

## 2022-01-14 ENCOUNTER — Encounter: Payer: Self-pay | Admitting: Adult Health

## 2022-01-14 ENCOUNTER — Other Ambulatory Visit: Payer: Self-pay

## 2022-01-14 ENCOUNTER — Inpatient Hospital Stay: Payer: Medicaid Other

## 2022-01-14 VITALS — BP 145/64 | HR 64 | Temp 97.8°F | Resp 18 | Ht 71.0 in | Wt 219.2 lb

## 2022-01-14 DIAGNOSIS — E2839 Other primary ovarian failure: Secondary | ICD-10-CM

## 2022-01-14 DIAGNOSIS — Z79811 Long term (current) use of aromatase inhibitors: Secondary | ICD-10-CM | POA: Diagnosis not present

## 2022-01-14 DIAGNOSIS — D649 Anemia, unspecified: Secondary | ICD-10-CM | POA: Insufficient documentation

## 2022-01-14 DIAGNOSIS — Z170 Estrogen receptor positive status [ER+]: Secondary | ICD-10-CM

## 2022-01-14 DIAGNOSIS — Z13820 Encounter for screening for osteoporosis: Secondary | ICD-10-CM | POA: Insufficient documentation

## 2022-01-14 DIAGNOSIS — C50411 Malignant neoplasm of upper-outer quadrant of right female breast: Secondary | ICD-10-CM | POA: Insufficient documentation

## 2022-01-14 DIAGNOSIS — C50412 Malignant neoplasm of upper-outer quadrant of left female breast: Secondary | ICD-10-CM | POA: Insufficient documentation

## 2022-01-14 LAB — CMP (CANCER CENTER ONLY)
ALT: 14 U/L (ref 0–44)
AST: 15 U/L (ref 15–41)
Albumin: 4 g/dL (ref 3.5–5.0)
Alkaline Phosphatase: 97 U/L (ref 38–126)
Anion gap: 5 (ref 5–15)
BUN: 12 mg/dL (ref 6–20)
CO2: 30 mmol/L (ref 22–32)
Calcium: 9.3 mg/dL (ref 8.9–10.3)
Chloride: 105 mmol/L (ref 98–111)
Creatinine: 0.84 mg/dL (ref 0.44–1.00)
GFR, Estimated: >60 mL/min (ref >60)
Glucose, Bld: 122 mg/dL — ABNORMAL HIGH (ref 70–99)
Potassium: 3.7 mmol/L (ref 3.5–5.1)
Sodium: 140 mmol/L (ref 135–145)
Total Bilirubin: 0.4 mg/dL (ref 0.3–1.2)
Total Protein: 7.1 g/dL (ref 6.5–8.1)

## 2022-01-14 LAB — CBC WITH DIFFERENTIAL (CANCER CENTER ONLY)
Abs Immature Granulocytes: 0.02 10*3/uL (ref 0.00–0.07)
Basophils Absolute: 0 10*3/uL (ref 0.0–0.1)
Basophils Relative: 1 %
Eosinophils Absolute: 0.2 10*3/uL (ref 0.0–0.5)
Eosinophils Relative: 5 %
HCT: 34.7 % — ABNORMAL LOW (ref 36.0–46.0)
Hemoglobin: 10.9 g/dL — ABNORMAL LOW (ref 12.0–15.0)
Immature Granulocytes: 0 %
Lymphocytes Relative: 23 %
Lymphs Abs: 1.1 10*3/uL (ref 0.7–4.0)
MCH: 23.5 pg — ABNORMAL LOW (ref 26.0–34.0)
MCHC: 31.4 g/dL (ref 30.0–36.0)
MCV: 74.8 fL — ABNORMAL LOW (ref 80.0–100.0)
Monocytes Absolute: 0.3 10*3/uL (ref 0.1–1.0)
Monocytes Relative: 7 %
Neutro Abs: 3.1 10*3/uL (ref 1.7–7.7)
Neutrophils Relative %: 64 %
Platelet Count: 269 10*3/uL (ref 150–400)
RBC: 4.64 MIL/uL (ref 3.87–5.11)
RDW: 16.8 % — ABNORMAL HIGH (ref 11.5–15.5)
WBC Count: 4.7 10*3/uL (ref 4.0–10.5)
nRBC: 0 % (ref 0.0–0.2)

## 2022-01-14 LAB — PREGNANCY, URINE: Preg Test, Ur: NEGATIVE

## 2022-01-14 NOTE — Progress Notes (Signed)
SURVIVORSHIP VISIT:   BRIEF ONCOLOGIC HISTORY:  Oncology History  Malignant neoplasm of upper-outer quadrant of right breast in female, estrogen receptor positive ([place])  12/12/2020 Initial Diagnosis   status post bilateral breast biopsies 12/03/2020, showing             (1) on the right, a clinical T2 N1, stage IIa invasive lobular carcinoma, grade 2, estrogen and progesterone receptor strongly positive, HER2 not amplified, with an MIB-1 of 40%                         (a) the biopsied right axillary lymph node was positive with extracapsular extension                         (b) a second right breast mass also biopsied was a fibroadenoma, concordant  MAMMAPRINT tested on biopsy returned high risk, luminal type B, indicating significant benefit from chemo                         (c) biopsy of an area of non-mass-like enhancement in the upper right breast pending   12/17/2020 Cancer Staging   Staging form: Breast, AJCC 8th Edition - Clinical stage from 12/17/2020: cT2, cN1, cM0, ER+, PR+, HER2- - Signed by [name], MD on 12/17/2020 Stage prefix: Initial diagnosis Laterality: Right Staged by: Pathologist and managing physician Stage used in treatment planning: Yes National guidelines used in treatment planning: Yes Type of national guideline used in treatment planning: NCCN   12/24/2020 Genetic Testing   Negative genetic testing:  No pathogenic variants detected on the [employer] BRCAplus panel (report date 12/24/2020) or the CancerNext-Expanded + RNAinsight panel (report date 12/31/2020). 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   The BRCAplus panel offered by [employer] and includes sequencing and deletion/duplication analysis for the following 8 genes: ATM, BRCA1, BRCA2, CDH1, CHEK2, PALB2, PTEN, and TP53. The CancerNext-Expanded + RNAinsight gene panel offered by [employer] and includes sequencing and rearrangement analysis for  the following 77 genes: AIP, ALK, APC, ATM, AXIN2, BAP1, BARD1, BLM, BMPR1A, BRCA1, BRCA2, BRIP1, CDC73, CDH1, CDK4, CDKN1B, CDKN2A, CHEK2, CTNNA1, DICER1, FANCC, FH, FLCN, GALNT12, KIF1B, LZTR1, MAX, MEN1, MET, MLH1, MSH2, MSH3, MSH6, MUTYH, NBN, NF1, NF2, NTHL1, PALB2, PHOX2B, PMS2, POT1, PRKAR1A, PTCH1, PTEN, RAD51C, RAD51D, RB1, RECQL, RET, SDHA, SDHAF2, SDHB, SDHC, SDHD, SMAD4, SMARCA4, SMARCB1, SMARCE1, STK11, SUFU, TMEM127, TP53, TSC1, TSC2, VHL and XRCC2 (sequencing and deletion/duplication); EGFR, EGLN1, HOXB13, KIT, MITF, PDGFRA, POLD1 and POLE (sequencing only); EPCAM and GREM1 (deletion/duplication only). RNA data is routinely analyzed for use in variant interpretation for all genes.   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, margins negative, 0/1 lymph node negative ER 95%, PR 100%   07/09/2021 Cancer Staging   Staging form: Breast, AJCC 8th Edition - Pathologic stage from 07/09/2021: No Stage Recommended (ypT2, pN1a, cM0, G2) - Signed by [name], NP on 07/22/2021 Stage prefix: Post-therapy Histologic grading system: 3 grade system   07/2021 -  Radiation Therapy   Adjuvant radiation to follow surgery   11/2021 -  Anti-estrogen oral therapy   Letrozole x 5-7 years   Malignant neoplasm of upper-outer quadrant of left breast in female, estrogen receptor positive ([name])  12/12/2020 Initial Diagnosis   on the left, a clinical T1b N0, stage IA invasive ductal carcinoma, grade 1 or 2, estrogen and progesterone receptor positive, HER2 not amplified, with an MIB 1 of  15%   12/17/2020 Cancer Staging   Staging form: Breast, AJCC 8th Edition - Clinical stage from 12/17/2020: cT1b, cN0, cM0, ER+, PR+, HER2- - Signed by [name], MD on 12/17/2020 Stage prefix: Initial diagnosis Laterality: Left Staged by: Pathologist and managing physician Stage used in treatment planning: Yes National guidelines used in treatment planning: Yes Type of national guideline used in treatment planning: NCCN   12/24/2020 Genetic Testing   Negative genetic testing:  No pathogenic variants detected on the [employer] BRCAplus panel (report date 12/24/2020) or the CancerNext-Expanded + RNAinsight panel (report date 12/31/2020). 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   The BRCAplus panel offered by [employer] and includes sequencing and deletion/duplication analysis for the following 8 genes: ATM, BRCA1, BRCA2, CDH1, CHEK2, PALB2, PTEN, and TP53. The CancerNext-Expanded + RNAinsight gene panel offered by [employer] and includes sequencing and rearrangement analysis for the following 77 genes: AIP, ALK, APC, ATM, AXIN2, BAP1, BARD1, BLM, BMPR1A, BRCA1, BRCA2, BRIP1, CDC73, CDH1, CDK4, CDKN1B, CDKN2A, CHEK2, CTNNA1, DICER1, FANCC, FH, FLCN, GALNT12, KIF1B, LZTR1, MAX, MEN1, MET, MLH1, MSH2, MSH3, MSH6, MUTYH, NBN, NF1, NF2, NTHL1, PALB2, PHOX2B, PMS2, POT1, PRKAR1A, PTCH1, PTEN, RAD51C, RAD51D, RB1, RECQL, RET, SDHA, SDHAF2, SDHB, SDHC, SDHD, SMAD4, SMARCA4, SMARCB1, SMARCE1, STK11, SUFU, TMEM127, TP53, TSC1, TSC2, VHL and XRCC2 (sequencing and deletion/duplication); EGFR, EGLN1, HOXB13, KIT, MITF, PDGFRA, POLD1 and POLE (sequencing only); EPCAM and GREM1 (deletion/duplication only). RNA data is routinely analyzed for use in variant interpretation for all genes.   01/27/2021 -  Neo-Adjuvant Chemotherapy   Neoadjuvant chemotherapy with Doxorubicin and Cyclophosphamide given x 4 beginning 01/27/2021 and completing on 03/10/2021 followed by  weekly paclitaxel x 12 beginning 03/24/2021   07/09/2021 Surgery   Right lumpectomy: Grade 2 invasive lobular cancer, 2.5 cm, LCIS, margins negative, LCIS focally at anterior margin, 1/1 lymph node positive, ER 95%, PR 100%, HER2 negative, Ki-67 40% Left lumpectomy: High-grade DCIS: 0.7 cm, margins negative, 0/1 lymph node negative ER 95%, PR 100%   07/09/2021 Cancer Staging   Staging form: Breast, AJCC 8th Edition - Pathologic stage from 07/09/2021: No Stage Recommended (ypTis (DCIS), pN0, cM0) - Signed by [name], NP on 07/22/2021 Stage prefix: Post-therapy   07/2021 -  Radiation Therapy   Adjuvant radiation to follow surgery   11/2021 -  Anti-estrogen oral therapy   Letrozole x 5-7 years     INTERVAL HISTORY:  Ms. Cadle to review her survivorship care plan detailing her treatment course for breast cancer, as well as monitoring long-term side effects of that treatment, education regarding health maintenance, screening, and overall wellness and health promotion.     Overall, [name] reports feeling quite well.  She is having left nipple discharge and itching.    She is taking letrozole and experiences hot flashes, arthralgias and sciatic type pain down her legs.    REVIEW OF SYSTEMS:  Review of Systems  Constitutional:  Negative for appetite change, chills, fatigue, fever and unexpected weight change.  HENT:   Negative  for hearing loss, lump/mass and trouble swallowing.   Eyes:  Negative for eye problems and icterus.  Respiratory:  Negative for chest tightness, cough and shortness of breath.   Cardiovascular:  Negative for chest pain, leg swelling and palpitations.  Gastrointestinal:  Negative for abdominal distention, abdominal pain, constipation, diarrhea, nausea and vomiting.  Endocrine: Positive for hot flashes.  Genitourinary:  Negative for difficulty urinating.   Musculoskeletal:  Negative for arthralgias.  Skin:  Negative for itching and rash.  Neurological:   Negative for dizziness, extremity weakness, headaches and numbness.  Hematological:  Negative for adenopathy. Does not bruise/bleed easily.  Psychiatric/Behavioral:  Negative for depression. The patient is not nervous/anxious.     ONCOLOGY TREATMENT TEAM:  1. Surgeon:  Dr[facility] Ninfa Linden at [facility] 2. Medical Oncologist: Dr. Lindi Adie  3. Radiation Oncologist: Dr. Sondra Come    PAST MEDICAL/SURGICAL HISTORY:  Past Medical History:  Diagnosis Date   Allergy    seasonal allergies   Anxiety    on meds   Asthma    uses inhaler   Breast cancer ([place]) 2012   RIGHT lumpectomy   Cancer ([place]) 2022   RIGHT breast lump-dx 2022   Depression    on meds   DVT (deep venous thrombosis) ([place]) 2010   after hysterectomy   Family history of uterine cancer    GERD (gastroesophageal reflux disease)    with certain foods/OTC PRN meds   Headache(784.0)    Hyperlipidemia    on meds   Hypertension    on meds   SVT (supraventricular tachycardia) ([place])    Past Surgical History:  Procedure Laterality Date   ABDOMINAL HYSTERECTOMY     AXILLARY SENTINEL NODE BIOPSY Left 07/09/2021   Procedure: LEFT AXILLARY SENTINEL NODE BIOPSY;  Surgeon: [name], MD;  Location: [place];  Service: General;  Laterality: Left;   BREAST EXCISIONAL BIOPSY Right 09/16/2009   BREAST LUMPECTOMY WITH RADIOACTIVE SEED LOCALIZATION Bilateral 07/09/2021   Procedure: BILATERAL BREAST LUMPECTOMY WITH RADIOACTIVE SEED LOCALIZATION;  Surgeon: [name], MD;  Location: [place];  Service: General;  Laterality: Bilateral;   BREAST SURGERY     lumpectomy   PORT-A-CATH REMOVAL Left 07/09/2021   Procedure: REMOVAL PORT-A-CATH;  Surgeon: [name], MD;  Location: [place];  Service: General;  Laterality: Left;   PORTACATH PLACEMENT Left 01/26/2021   Procedure: INSERTION PORT-A-CATH;  Surgeon: [name], MD;  Location: [facility];  Service: General;   Laterality: Left;   RADIOACTIVE SEED GUIDED AXILLARY SENTINEL LYMPH NODE Right 07/09/2021   Procedure: RADIOACTIVE SEED GUIDED RIGHT AXILLARY SENTINEL LYMPH NODE DISSECTION;  Surgeon: [name], MD;  Location: [place];  Service: General;  Laterality: Right;   TUBAL LIGATION       ALLERGIES:  No Known Allergies   CURRENT MEDICATIONS:  Outpatient Encounter Medications as of 01/14/2022  Medication Sig   albuterol (VENTOLIN HFA) 108 (90 Base) MCG/ACT inhaler Inhale 2 puffs by mouth into the lungs every 6 hours as needed for wheezing or shortness of breath.   amLODipine (NORVASC) 10 MG tablet Take 1 tablet  by mouth daily.   ARIPiprazole (ABILIFY) 15 MG tablet Take 1 tablet by mouth at bedtime   escitalopram (LEXAPRO) 20 MG tablet Take 1 tablet (20 mg total) by mouth daily.   hydrochlorothiazide (HYDRODIURIL) 25 MG tablet Take 1 tablet by mouth daily.   letrozole (FEMARA) 2.5 MG tablet Take 1 tablet (2.5 mg total) by mouth daily.  montelukast (SINGULAIR) 10 MG tablet Take 1 tablet (10 mg total) by mouth at bedtime.   [START ON 01/21/2022] oxyCODONE-acetaminophen (PERCOCET) 5-325 MG tablet Take 1 tablet by mouth every 6 (six) hours as needed for severe pain.  **01/21/2022   propranolol (INDERAL) 10 MG tablet Take 1 tablet (10 mg total) by mouth 2 (two) times daily.   valsartan (DIOVAN) 40 MG tablet Take 1 tablet (40 mg total) by mouth daily.   Cholecalciferol (VITAMIN D3) 2000 units capsule Take 2,000 Units by mouth daily. (Patient not taking: Reported on 01/14/2022)   fluticasone (FLONASE) 50 MCG/ACT nasal spray Use 2 sprays in each nostril daily. (Patient not taking: Reported on 01/14/2022)   [DISCONTINUED] dexamethasone (DECADRON) 4 MG tablet Take 4 mg by mouth as needed. Given with chemo treatment. (Patient not taking: Reported on 01/14/2022)   [DISCONTINUED] oxyCODONE-acetaminophen (PERCOCET) 5-325 MG tablet Take 1 tablet by mouth every 6 hours as needed for severe pain.  (Patient not taking: Reported on 01/14/2022)   Facility-Administered Encounter Medications as of 01/14/2022  Medication   acetaminophen (TYLENOL) 500 MG tablet   ondansetron (ZOFRAN) 4 MG/2ML injection   ondansetron (ZOFRAN) 4 MG/2ML injection   sodium chloride flush (NS) 0.9 % injection 10 mL     ONCOLOGIC FAMILY HISTORY:  Family History  Problem Relation Age of Onset   Cancer Mother        ?, dx <50   Hypertension Father    Cancer Maternal Aunt        uterine vs ovarian, dx <50   Cancer Maternal Aunt        unknown type, dx <50   Uterine cancer Cousin 79   Colon polyps Neg Hx    Colon cancer Neg Hx    Esophageal cancer Neg Hx    Stomach cancer Neg Hx     SOCIAL HISTORY:  Social History   Socioeconomic History   Marital status: Single    Spouse name: Not on file   Number of children: Not on file   Years of education: Not on file   Highest education level: Not on file  Occupational History   Not on file  Tobacco Use   Smoking status: Never   Smokeless tobacco: Never  Vaping Use   Vaping Use: Never used  Substance and Sexual Activity   Alcohol use: Not Currently    Alcohol/week: 7.0 standard drinks of alcohol    Types: 7 Standard drinks or equivalent per week    Comment: occassional   Drug use: No   Sexual activity: Not Currently  Other Topics Concern   Not on file  Social History Narrative   Not on file   Social Determinants of Health   Financial Resource Strain: High Risk (12/17/2020)   Overall Financial Resource Strain (CARDIA)    Difficulty of Paying Living Expenses: Hard  Food Insecurity: Food Insecurity Present (12/17/2020)   Hunger Vital Sign    Worried About Running Out of Food in the Last Year: Sometimes true    Ran Out of Food in the Last Year: Sometimes true  Transportation Needs: No Transportation Needs (12/02/2020)   PRAPARE - Hydrologist (Medical): No    Lack of Transportation (Non-Medical): No  Physical  Activity: Not on file  Stress: Not on file  Social Connections: Not on file  Intimate Partner Violence: Not on file     OBSERVATIONS/OBJECTIVE:  BP (!) 145/64 (BP Location: Right Arm, Patient Position: Sitting)   Pulse  64   Temp 97.8 F (36.6 C) (Tympanic)   Resp 18   Ht '5\' 11"'  (1.803 m)   Wt 219 lb 3.2 oz (99.4 kg)   SpO2 100%   BMI 30.57 kg/m  GENERAL: Patient is a well appearing female in no acute distress HEENT:  Sclerae anicteric.  Oropharynx clear and moist. No ulcerations or evidence of oropharyngeal candidiasis. Neck is supple.  NODES:  No cervical, supraclavicular, or axillary lymphadenopathy palpated.  BREAST EXAM:  s/p bilateral lumpectomies and radiation.  Breasts are swollen and have radiation changes.  No nipple drainage noted.   LUNGS:  Clear to auscultation bilaterally.  No wheezes or rhonchi. HEART:  Regular rate and rhythm. No murmur appreciated. ABDOMEN:  Soft, nontender.  Positive, normoactive bowel sounds. No organomegaly palpated. MSK:  No focal spinal tenderness to palpation. Full range of motion bilaterally in the upper extremities. EXTREMITIES:  No peripheral edema.   SKIN:  Clear with no obvious rashes or skin changes. No nail dyscrasia. NEURO:  Nonfocal. Well oriented.  Appropriate affect.   LABORATORY DATA:  None for this visit.  DIAGNOSTIC IMAGING:  None for this visit.      ASSESSMENT AND PLAN:  Ms.. Bluett is a pleasant 51 y.o. female with Stage IA left breast invasive ductal carcinoma, ER+/PR+/HER2-, diagnosed in 11/2020, treated with neo-adjuvant chemotherapy, bilateral lumpectomies, adjuvant radiation therapy, and anti-estrogen therapy with Letrozole beginning in 11/2021.  She presents to the Survivorship Clinic for our initial meeting and routine follow-up post-completion of treatment for breast cancer.    1. Bilateral breast cancer:  Ms. Blumenstock is continuing to recover from definitive treatment for breast cancer. She will follow-up with her  medical oncologist, Dr. Lindi Adie in 02/2022 with history and physical exam per surveillance protocol.  She will continue her anti-estrogen therapy with Letrozole.  She is struggling with side effects and we discussed potentially stopping therapy.  I ordered bilateral mammograms and ultrasounds of her breast to evaluate for any signs of breast cancer recurrence considering the nipple drainage she has been reporting.  This could very well also be breast lymphedema therefore if mammograms are negative would recommend placing a referral for physical therapy evaluation and management.    Today, a comprehensive survivorship care plan and treatment summary was reviewed with the patient today detailing her breast cancer diagnosis, treatment course, potential late/long-term effects of treatment, appropriate follow-up care with recommendations for the future, and patient education resources.  A copy of this summary, along with a letter will be sent to the patient's primary care provider via mail/fax/In Basket message after today's visit.    2.  Anemia: We will further evaluate her labs at her next appointment with Dr. Lindi Adie which will occur in approximately 4 weeks.  I also ordered [facility] LH and estradiol as her most recent lab results 1 year ago indicated that she was not postmenopausal.  This testing happened prior to her beginning chemotherapy.  She is status post hysterectomy and still has her ovaries.  3. Bone health:  Given Ms. Mccaul age/history of breast cancer and her current treatment regimen including anti-estrogen therapy with Letrozole, she is at risk for bone demineralization.  I placed orders for bone density testing today.  She was given education on specific activities to promote bone health.  4. Cancer screening:  Due to Ms. Youkhana's history and her age, she should receive screening for skin cancers, colon cancer, and gynecologic cancers.  The information and recommendations are listed on the patient's  comprehensive care plan/treatment summary and were reviewed in detail with the patient.    5. Health maintenance and wellness promotion: Ms. Oshields was encouraged to consume 5-7 servings of fruits and vegetables per day. We reviewed the "Nutrition Rainbow" handout.  She was also encouraged to engage in moderate to vigorous exercise for 30 minutes per day most days of the week. We discussed the [employer] fitness program, which is designed for cancer survivors to help them become more physically fit after cancer treatments.  She was instructed to limit her alcohol consumption and continue to abstain from tobacco use.     6. Support services/counseling: It is not uncommon for this period of the patient's cancer care trajectory to be one of many emotions and stressors.  She was given information regarding our available services and encouraged to contact me with any questions or for help enrolling in any of our support group/programs.    Follow up instructions:    -Return to cancer center in 02/2022 for lab and f/u with Dr. Lindi Adie -Mammogram due  -Follow up with surgery -She is welcome to return back to the Survivorship Clinic at any time; no additional follow-up needed at this time.  -Consider referral back to survivorship as a long-term survivor for continued surveillance  The patient was provided an opportunity to ask questions and all were answered. The patient agreed with the plan and demonstrated an understanding of the instructions.   Total encounter time:40 minutes*in face-to-face visit time, chart review, lab review, care coordination, order entry, and documentation of the encounter time.    Wilber Bihari, NP 01/17/22 1:50 PM Medical Oncology and Hematology [facility] [place], [place] [address] Tel. [phone]    Fax. [phone]  *Total Encounter Time as defined by the Centers for Medicare and Medicaid Services includes, in addition to the  face-to-face time of a patient visit (documented in the note above) non-face-to-face time: obtaining and reviewing outside history, ordering and reviewing medications, tests or procedures, care coordination (communications with other health care professionals or caregivers) and documentation in the medical record.

## 2022-01-14 NOTE — Progress Notes (Signed)
[facility] CSW Progress Note  Holiday representative met with patient to provide resources. CSW provided bag of food from [employer] as well as third [employer]. Patient was trying to return to work, but work did not approve her accommodation request. CSW discussed that it can be a negotiation. Pt is also looking for other work- CSW shared contact for [employer] women to work program.  Pt does currently have a place to stay- living with a friend.  No other needs at this time. Pt encouraged to contact CSW with any other needs.    Trayvon Trumbull E Kaiyden Simkin, LCSW    Patient is participating in a Managed Medicaid Plan:  Yes

## 2022-01-15 ENCOUNTER — Telehealth: Payer: Self-pay | Admitting: Adult Health

## 2022-01-15 NOTE — Telephone Encounter (Signed)
Scheduled appointment per 7/27 los.Left voicemail.

## 2022-01-16 ENCOUNTER — Other Ambulatory Visit: Payer: Self-pay

## 2022-01-22 ENCOUNTER — Ambulatory Visit: Payer: Medicaid Other

## 2022-01-22 ENCOUNTER — Ambulatory Visit
Admission: RE | Admit: 2022-01-22 | Discharge: 2022-01-22 | Disposition: A | Discharge status: Home / Self Care | Payer: Medicaid Other | Source: Ambulatory Visit | Attending: Adult Health | Admitting: Adult Health

## 2022-01-22 ENCOUNTER — Other Ambulatory Visit (INDEPENDENT_AMBULATORY_CARE_PROVIDER_SITE_OTHER): Payer: Self-pay | Admitting: Primary Care

## 2022-01-22 ENCOUNTER — Other Ambulatory Visit (HOSPITAL_COMMUNITY): Payer: Self-pay

## 2022-01-22 DIAGNOSIS — Z170 Estrogen receptor positive status [ER+]: Secondary | ICD-10-CM

## 2022-01-22 DIAGNOSIS — I10 Essential (primary) hypertension: Secondary | ICD-10-CM

## 2022-01-22 MED ORDER — HYDROCHLOROTHIAZIDE 25 MG PO TABS
25.0000 mg | ORAL_TABLET | Freq: Every day | ORAL | 0 refills | Status: DC
  Filled 2022-01-22 – 2022-02-23 (×2): qty 30, 30d supply, fill #0

## 2022-01-25 ENCOUNTER — Ambulatory Visit (HOSPITAL_BASED_OUTPATIENT_CLINIC_OR_DEPARTMENT_OTHER): Admission: RE | Admit: 2022-01-25 | Payer: Medicaid Other | Source: Ambulatory Visit

## 2022-02-02 ENCOUNTER — Other Ambulatory Visit (HOSPITAL_COMMUNITY): Payer: Self-pay

## 2022-02-11 ENCOUNTER — Ambulatory Visit
Payer: Medicaid Other | Attending: Student in an Organized Health Care Education/Training Program | Admitting: Student in an Organized Health Care Education/Training Program

## 2022-02-11 ENCOUNTER — Other Ambulatory Visit (HOSPITAL_COMMUNITY): Payer: Self-pay

## 2022-02-11 ENCOUNTER — Other Ambulatory Visit: Payer: Self-pay

## 2022-02-11 ENCOUNTER — Encounter: Payer: Self-pay | Admitting: Student in an Organized Health Care Education/Training Program

## 2022-02-11 VITALS — BP 152/91 | HR 69 | Temp 97.3°F | Resp 16 | Ht 71.0 in | Wt 219.0 lb

## 2022-02-11 DIAGNOSIS — G894 Chronic pain syndrome: Secondary | ICD-10-CM | POA: Diagnosis present

## 2022-02-11 DIAGNOSIS — C50412 Malignant neoplasm of upper-outer quadrant of left female breast: Secondary | ICD-10-CM | POA: Insufficient documentation

## 2022-02-11 DIAGNOSIS — Z0289 Encounter for other administrative examinations: Secondary | ICD-10-CM | POA: Insufficient documentation

## 2022-02-11 DIAGNOSIS — C50411 Malignant neoplasm of upper-outer quadrant of right female breast: Secondary | ICD-10-CM | POA: Insufficient documentation

## 2022-02-11 DIAGNOSIS — Z170 Estrogen receptor positive status [ER+]: Secondary | ICD-10-CM | POA: Diagnosis present

## 2022-02-11 DIAGNOSIS — E669 Obesity, unspecified: Secondary | ICD-10-CM | POA: Insufficient documentation

## 2022-02-11 MED ORDER — OXYCODONE-ACETAMINOPHEN 5-325 MG PO TABS
1.0000 | ORAL_TABLET | Freq: Three times a day (TID) | ORAL | 0 refills | Status: AC | PRN
  Filled 2022-02-23: qty 90, 30d supply, fill #0

## 2022-02-11 MED ORDER — OXYCODONE-ACETAMINOPHEN 5-325 MG PO TABS
1.0000 | ORAL_TABLET | Freq: Three times a day (TID) | ORAL | 0 refills | Status: DC | PRN
  Filled 2022-04-23: qty 90, 30d supply, fill #0

## 2022-02-11 MED ORDER — OXYCODONE-ACETAMINOPHEN 5-325 MG PO TABS
1.0000 | ORAL_TABLET | Freq: Three times a day (TID) | ORAL | 0 refills | Status: AC | PRN
  Filled 2022-03-23: qty 90, 30d supply, fill #0

## 2022-02-11 NOTE — Progress Notes (Signed)
Nursing Pain Medication Assessment:  Safety precautions to be maintained throughout the outpatient stay will include: orient to surroundings, keep bed in low position, maintain call bell within reach at all times, provide assistance with transfer out of bed and ambulation.  Medication Inspection Compliance: Pill count conducted under aseptic conditions, in front of the patient. Neither the pills nor the bottle was removed from the patient's sight at any time. Once count was completed pills were immediately returned to the patient in their original bottle.  Medication: Oxycodone/APAP Pill/Patch Count:  40 of 90 pills remain Pill/Patch Appearance: Markings consistent with prescribed medication Bottle Appearance: Standard pharmacy container. Clearly labeled. Filled Date: 08 / 04 / 2023 Last Medication intake:  Yesterday

## 2022-02-11 NOTE — Progress Notes (Signed)
PROVIDER NOTE: Information contained herein reflects review and annotations entered in association with encounter. Interpretation of such information and data should be left to medically-trained personnel. Information provided to patient can be located elsewhere in the medical record under "Patient Instructions". Document created using STT-dictation technology, any transcriptional errors that may result from process are unintentional.    Patient: Chelsea Hansen  Service Category: E/M  Provider: Gillis Santa, MD  DOB: 11-30-1970  DOS: 02/11/2022  Specialty: Interventional Pain Management  MRN: [id number]  Setting: Ambulatory outpatient  PCP: [name], NP  Type: Established Patient    Referring Provider: [name], NP  Location: Office  Delivery: Face-to-face     HPI  [name], a 51 y.o. year old female, is here today because of her Chronic pain syndrome [G89.4]. Ms. Nonaka primary complain today is Other (All over body pain d/t cancer treatment ) and Bone Pain (Caused by a new medicine that she is taking for the cancer treatment ) Last encounter: My last encounter with her was on 11/19/21 Pertinent problems: Ms. Heringer has Malignant neoplasm of upper-outer quadrant of right breast in female, estrogen receptor positive ([place]); Malignant neoplasm of upper-outer quadrant of left breast in female, estrogen receptor positive ([place]); Obesity (BMI 30-39.9); Pain management contract signed; and Chronic pain syndrome (breast cancer) on their pertinent problem list. Pain Assessment: Severity of Chronic pain is reported as a 10-Worst pain ever/10. Location: Other (Comment) (cancer pain from treatment) Left, Right/hip pain into both legs. Onset: More than a month ago. Quality: Discomfort, Constant, Sharp. Timing: Constant. Modifying factor(s): medications and rest. Vitals:  height is '5\' 11"'  (1.803 m) and weight is 219 lb (99.3 kg). Her temporal temperature is 97.3 F (36.3 C) (abnormal). Her  blood pressure is 152/91 (abnormal) and her pulse is 69. Her respiration is 16 and oxygen saturation is 100%.   Reason for encounter: medication management.    -Patient is status post bilateral breast lumpectomy for breast cancer done on 07/09/2021.  -She is receiving radiation therapy for her breast cancer.  She states that she is having increased bony pain as a result of her radiation therapy.  She hopes to be done with that next week.  She is at her last treatment rounds. -Is taking oxycodone  5 mg every 8 hours as needed -Renew annual urine toxicology screen today.  Pharmacotherapy Assessment  Analgesic:  Percocet 5 mg every 8 hours as needed as the patient is currently receiving radiation therapy and is encountering increased acute pain.  Monitoring: [place] PMP: PDMP reviewed during this encounter.       Pharmacotherapy: No side-effects or adverse reactions reported. Compliance: No problems identified. Effectiveness: Clinically acceptable.  Janett Billow, RN  02/11/2022 11:26 AM  Sign when Signing Visit Nursing Pain Medication Assessment:  Safety precautions to be maintained throughout the outpatient stay will include: orient to surroundings, keep bed in low position, maintain call bell within reach at all times, provide assistance with transfer out of bed and ambulation.  Medication Inspection Compliance: Pill count conducted under aseptic conditions, in front of the patient. Neither the pills nor the bottle was removed from the patient's sight at any time. Once count was completed pills were immediately returned to the patient in their original bottle.  Medication: Oxycodone/APAP Pill/Patch Count:  40 of 90 pills remain Pill/Patch Appearance: Markings consistent with prescribed medication Bottle Appearance: Standard pharmacy container. Clearly labeled. Filled Date: 08 / 04 / 2023 Last Medication intake:  Yesterday     UDS:  Summary  Date Value Ref Range Status  04/08/2021  Note  Final    Comment:    ==================================================================== Compliance Drug Analysis, Ur ==================================================================== Test                             Result       Flag       Units  Drug Present not Declared for Prescription Verification   Diphenhydramine                PRESENT      UNEXPECTED  Drug Absent but Declared for Prescription Verification   Gabapentin                     Not Detected UNEXPECTED   Citalopram                     Not Detected UNEXPECTED   Acetaminophen                  Not Detected UNEXPECTED    Acetaminophen, as indicated in the declared medication list, is not    always detected even when used as directed.  ==================================================================== Test                      Result    Flag   Units      Ref Range   Creatinine              133              mg/dL      >=20 ==================================================================== Declared Medications:  The flagging and interpretation on this report are based on the  following declared medications.  Unexpected results may arise from  inaccuracies in the declared medications.   **Note: The testing scope of this panel includes these medications:   Escitalopram (Lexapro)  Gabapentin (Neurontin)   **Note: The testing scope of this panel does not include small to  moderate amounts of these reported medications:   Acetaminophen (Tylenol)   **Note: The testing scope of this panel does not include the  following reported medications:   Albuterol (Ventolin HFA)  Amlodipine (Norvasc)  Fluticasone (Flonase)  Hydrochlorothiazide  Loratadine (Claritin)  Montelukast (Singulair)  Multivitamin  Omeprazole (Prilosec)  Vitamin D3 ==================================================================== For clinical consultation, please call [phone]. ====================================================================      ROS  Constitutional: Denies any fever or chills Gastrointestinal: No reported hemesis, hematochezia, vomiting, or acute GI distress Musculoskeletal:  Diffuse whole body pain, low back bilateral hip pain Neurological: No reported episodes of acute onset apraxia, aphasia, dysarthria, agnosia, amnesia, paralysis, loss of coordination, or loss of consciousness  Medication Review  ARIPiprazole, albuterol, amLODipine, escitalopram, hydrochlorothiazide, letrozole, montelukast, oxyCODONE-acetaminophen, propranolol, and valsartan  History Review  Allergy: Ms. Murph has No Known Allergies. Drug: Ms. Bryk  reports no history of drug use. Alcohol:  reports that she does not currently use alcohol after a past usage of about 7.0 standard drinks of alcohol per week. Tobacco:  reports that she has never smoked. She has never used smokeless tobacco. Social: Ms. Apel  reports that she has never smoked. She has never used smokeless tobacco. She reports that she does not currently use alcohol after a past usage of about 7.0 standard drinks of alcohol per week. She reports that she does not use drugs. Medical:  has a past medical  history of Allergy, Anxiety, Asthma, Breast cancer ([place]) (2012), Cancer ([place]) (2022), Depression, DVT (deep venous thrombosis) ([place]) (2010), Family history of uterine cancer, GERD (gastroesophageal reflux disease), Headache(784.0), Hyperlipidemia, Hypertension, and SVT (supraventricular tachycardia) ([place]). Surgical: Ms. Stigler  has a past surgical history that includes Abdominal hysterectomy; Breast surgery; Tubal ligation; Breast excisional biopsy (Right, 09/16/2009); Portacath placement (Left, 01/26/2021); Breast lumpectomy with radioactive seed localization (Bilateral, 07/09/2021); Radioactive seed guided axillary sentinel lymph node (Right, 07/09/2021); Axillary sentinel node biopsy (Left, 07/09/2021); and  Port-a-cath removal (Left, 07/09/2021). Family: family history includes Breast cancer in her maternal aunt and mother; Breast cancer (age of onset: 14) in her cousin; Hypertension in her father; Ovarian cancer in her maternal aunt; Uterine cancer in her maternal aunt; Uterine cancer (age of onset: 55) in her cousin.  Laboratory Chemistry Profile   Renal Lab Results  Component Value Date   BUN 12 01/14/2022   CREATININE 0.84 01/14/2022   BCR 15 10/06/2020   GFRAA >60 09/07/2019   GFRNONAA >60 01/14/2022    Hepatic Lab Results  Component Value Date   AST 15 01/14/2022   ALT 14 01/14/2022   ALBUMIN 4.0 01/14/2022   ALKPHOS 97 01/14/2022   LIPASE 21 01/28/2021    Electrolytes Lab Results  Component Value Date   NA 140 01/14/2022   K 3.7 01/14/2022   CL 105 01/14/2022   CALCIUM 9.3 01/14/2022   MG 2.0 01/28/2021    Bone No results found for: "VD25OH", "VD125OH2TOT", "OE3212YQ8", "GN0037CW8", "25OHVITD1", "25OHVITD2", "25OHVITD3", "TESTOFREE", "TESTOSTERONE"  Inflammation (CRP: Acute Phase) (ESR: Chronic Phase) Lab Results  Component Value Date   ESRSEDRATE 11 12/31/2011   LATICACIDVEN 0.87 11/01/2013         Note: Above Lab results reviewed.  Recent Imaging Review  US BREAST LTD UNI RIGHT INC AXILLA CLINICAL DATA:  Patient with history of bilateral lumpectomies and right axillary lymph node removal presenting for palpable abnormality within the right breast at the lumpectomy site. Patient also reports a history of bilateral whitish nipple discharge which has resolved over the last 2 weeks.  EXAM: DIGITAL DIAGNOSTIC BILATERAL MAMMOGRAM WITH TOMOSYNTHESIS; ULTRASOUND RIGHT BREAST LIMITED  TECHNIQUE: Bilateral digital diagnostic mammography and breast tomosynthesis was performed.; Targeted ultrasound examination of the right breast was performed  COMPARISON:  Previous exam(s).  ACR Breast Density Category c: The breast tissue is heterogeneously dense, which may  obscure small masses.  FINDINGS: Interval postlumpectomy changes within the right and left breast. No new masses, calcifications or nonsurgical distortion identified within either breast.  On physical exam, there is a small palpable mass within the outer right breast.  Targeted ultrasound is performed, showing a 3.0 x 0.9 x 2.1 cm seroma right breast 10 o'clock position 5 cm from nipple at the palpable site of concern.  IMPRESSION: Palpable abnormality right breast corresponds with postsurgical seroma.  No mammographic evidence for malignancy.  Interval bilateral lumpectomy changes.  RECOMMENDATION: Bilateral diagnostic mammography in 1 year.  I have discussed the findings and recommendations with the patient. If applicable, a reminder letter will be sent to the patient regarding the next appointment.  BI-RADS CATEGORY  2: Benign.  Electronically Signed   By: Lovey Newcomer M.D.   On: 01/22/2022 10:13 MM DIAG BREAST TOMO BILATERAL CLINICAL DATA:  Patient with history of bilateral lumpectomies and right axillary lymph node removal presenting for palpable abnormality within the right breast at the lumpectomy site. Patient also reports a history of bilateral whitish nipple discharge which has resolved over the  last 2 weeks.  EXAM: DIGITAL DIAGNOSTIC BILATERAL MAMMOGRAM WITH TOMOSYNTHESIS; ULTRASOUND RIGHT BREAST LIMITED  TECHNIQUE: Bilateral digital diagnostic mammography and breast tomosynthesis was performed.; Targeted ultrasound examination of the right breast was performed  COMPARISON:  Previous exam(s).  ACR Breast Density Category c: The breast tissue is heterogeneously dense, which may obscure small masses.  FINDINGS: Interval postlumpectomy changes within the right and left breast. No new masses, calcifications or nonsurgical distortion identified within either breast.  On physical exam, there is a small palpable mass within the outer right  breast.  Targeted ultrasound is performed, showing a 3.0 x 0.9 x 2.1 cm seroma right breast 10 o'clock position 5 cm from nipple at the palpable site of concern.  IMPRESSION: Palpable abnormality right breast corresponds with postsurgical seroma.  No mammographic evidence for malignancy.  Interval bilateral lumpectomy changes.  RECOMMENDATION: Bilateral diagnostic mammography in 1 year.  I have discussed the findings and recommendations with the patient. If applicable, a reminder letter will be sent to the patient regarding the next appointment.  BI-RADS CATEGORY  2: Benign.  Electronically Signed   By: Lovey Newcomer M.D.   On: 01/22/2022 10:13 Note: Reviewed        Physical Exam  General appearance: Well nourished, well developed, and well hydrated. In no apparent acute distress Mental status: Alert, oriented x 3 (person, place, & time)       Respiratory: No evidence of acute respiratory distress Eyes: PERLA Vitals: BP (!) 152/91 (BP Location: Left Arm, Patient Position: Sitting, Cuff Size: Large)   Pulse 69   Temp (!) 97.3 F (36.3 C) (Temporal)   Resp 16   Ht '5\' 11"'  (1.803 m)   Wt 219 lb (99.3 kg)   SpO2 100%   BMI 30.54 kg/m  BMI: Estimated body mass index is 30.54 kg/m as calculated from the following:   Height as of this encounter: '5\' 11"'  (1.803 m).   Weight as of this encounter: 219 lb (99.3 kg). Ideal: Ideal body weight: 70.8 kg (156 lb 1.4 oz) Adjusted ideal body weight: 82.2 kg (181 lb 4 oz)  Diffuse myalgias and arthralgias all over  Assessment   Status Diagnosis  Controlled Controlled Controlled 1. Chronic pain syndrome (breast cancer)   2. Pain management contract signed   3. Malignant neoplasm of upper-outer quadrant of right breast in female, estrogen receptor positive ([place])   4. Malignant neoplasm of upper-outer quadrant of left breast in female, estrogen receptor positive ([place])   5. Obesity (BMI 30-39.9)       Plan of Care    Ms.  ERMAL BRZOZOWSKI has a current medication list which includes the following long-term medication(s): albuterol, amlodipine, aripiprazole, escitalopram, hydrochlorothiazide, montelukast, propranolol, and valsartan.  Pharmacotherapy (Medications Ordered): Meds ordered this encounter  Medications   oxyCODONE-acetaminophen (PERCOCET) 5-325 MG tablet    Sig: Take 1 tablet by mouth every 8 hours as needed for severe pain. Must last 30 days. (02/21/22)    Dispense:  90 tablet    Refill:  0    Chronic Pain: STOP Act (Not applicable) Fill 1 day early if closed on refill date. Avoid benzodiazepines within 8 hours of opioids   oxyCODONE-acetaminophen (PERCOCET) 5-325 MG tablet    Sig: Take 1 tablet by mouth every 8 hours as needed for severe pain. Must last 30 days. (03/23/22)    Dispense:  90 tablet    Refill:  0    Chronic Pain: STOP Act (Not applicable) Fill 1 day early  if closed on refill date. Avoid benzodiazepines within 8 hours of opioids   oxyCODONE-acetaminophen (PERCOCET) 5-325 MG tablet    Sig: Take 1 tablet by mouth every 8 hours as needed for severe pain. Must last 30 days. (04/22/22)    Dispense:  90 tablet    Refill:  0    Chronic Pain: STOP Act (Not applicable) Fill 1 day early if closed on refill date. Avoid benzodiazepines within 8 hours of opioids   Orders Placed This Encounter  Procedures   ToxASSURE Select 13 (MW), Urine    Volume: 30 ml(s). Minimum 3 ml of urine is needed. Document temperature of fresh sample. Indications: Long term (current) use of opiate analgesic (Z79.891)    Order Specific Question:   Release to patient    Answer:   Immediate     Continue gabapentin 300 mg 3 times daily Dulcolax for bowel management   Follow-up plan:   Return in about 3 months (around 05/14/2022) for Medication Management, in person.    Recent Visits Date Type Provider Dept  11/19/21 Office Visit Gillis Santa, MD [facility]  Showing recent visits within past 90 days  and meeting all other requirements Today's Visits Date Type Provider Dept  02/11/22 Office Visit Gillis Santa, MD [facility]  Showing today's visits and meeting all other requirements Future Appointments Date Type Provider Dept  05/11/22 Appointment Gillis Santa, MD [facility]  Showing future appointments within next 90 days and meeting all other requirements  I discussed the assessment and treatment plan with the patient. The patient was provided an opportunity to ask questions and all were answered. The patient agreed with the plan and demonstrated an understanding of the instructions.  Patient advised to call back or seek an in-person evaluation if the symptoms or condition worsens.  Duration of encounter: 16mnutes.  Note by: BGillis Santa MD Date: 02/11/2022; Time: 1:45 PM

## 2022-02-16 LAB — TOXASSURE SELECT 13 (MW), URINE

## 2022-02-16 NOTE — Addendum Note (Signed)
Addended by: Gillis Santa on: 02/16/2022 02:07 PM   Modules accepted: Orders

## 2022-02-16 NOTE — Progress Notes (Signed)
Patient's urine toxicology screen that is routinely done to assess medication compliance monitoring was inappropriately negative for Oxycodone. Recommend repeat UDS.

## 2022-02-17 ENCOUNTER — Other Ambulatory Visit: Payer: Self-pay | Admitting: Student in an Organized Health Care Education/Training Program

## 2022-02-19 ENCOUNTER — Other Ambulatory Visit: Payer: Self-pay

## 2022-02-19 ENCOUNTER — Encounter: Payer: Self-pay | Admitting: Adult Health

## 2022-02-19 ENCOUNTER — Inpatient Hospital Stay (HOSPITAL_BASED_OUTPATIENT_CLINIC_OR_DEPARTMENT_OTHER): Payer: Medicaid Other | Admitting: Adult Health

## 2022-02-19 ENCOUNTER — Inpatient Hospital Stay: Payer: Medicaid Other | Attending: Adult Health

## 2022-02-19 VITALS — BP 166/107 | HR 63 | Temp 97.7°F | Resp 14 | Wt 220.8 lb

## 2022-02-19 DIAGNOSIS — Z170 Estrogen receptor positive status [ER+]: Secondary | ICD-10-CM

## 2022-02-19 DIAGNOSIS — G629 Polyneuropathy, unspecified: Secondary | ICD-10-CM | POA: Diagnosis not present

## 2022-02-19 DIAGNOSIS — C50411 Malignant neoplasm of upper-outer quadrant of right female breast: Secondary | ICD-10-CM

## 2022-02-19 DIAGNOSIS — Z79811 Long term (current) use of aromatase inhibitors: Secondary | ICD-10-CM | POA: Insufficient documentation

## 2022-02-19 DIAGNOSIS — C50412 Malignant neoplasm of upper-outer quadrant of left female breast: Secondary | ICD-10-CM | POA: Diagnosis present

## 2022-02-19 LAB — CMP (CANCER CENTER ONLY)
ALT: 15 U/L (ref 0–44)
AST: 16 U/L (ref 15–41)
Albumin: 4.2 g/dL (ref 3.5–5.0)
Alkaline Phosphatase: 104 U/L (ref 38–126)
Anion gap: 4 — ABNORMAL LOW (ref 5–15)
BUN: 17 mg/dL (ref 6–20)
CO2: 29 mmol/L (ref 22–32)
Calcium: 9.7 mg/dL (ref 8.9–10.3)
Chloride: 106 mmol/L (ref 98–111)
Creatinine: 0.74 mg/dL (ref 0.44–1.00)
GFR, Estimated: >60 mL/min (ref >60)
Glucose, Bld: 106 mg/dL — ABNORMAL HIGH (ref 70–99)
Potassium: 3.7 mmol/L (ref 3.5–5.1)
Sodium: 139 mmol/L (ref 135–145)
Total Bilirubin: 0.3 mg/dL (ref 0.3–1.2)
Total Protein: 7.3 g/dL (ref 6.5–8.1)

## 2022-02-19 LAB — CBC WITH DIFFERENTIAL (CANCER CENTER ONLY)
Abs Immature Granulocytes: 0.03 10*3/uL (ref 0.00–0.07)
Basophils Absolute: 0 10*3/uL (ref 0.0–0.1)
Basophils Relative: 1 %
Eosinophils Absolute: 0.2 10*3/uL (ref 0.0–0.5)
Eosinophils Relative: 4 %
HCT: 33.4 % — ABNORMAL LOW (ref 36.0–46.0)
Hemoglobin: 10.7 g/dL — ABNORMAL LOW (ref 12.0–15.0)
Immature Granulocytes: 1 %
Lymphocytes Relative: 23 %
Lymphs Abs: 1.1 10*3/uL (ref 0.7–4.0)
MCH: 23.7 pg — ABNORMAL LOW (ref 26.0–34.0)
MCHC: 32 g/dL (ref 30.0–36.0)
MCV: 74.1 fL — ABNORMAL LOW (ref 80.0–100.0)
Monocytes Absolute: 0.5 10*3/uL (ref 0.1–1.0)
Monocytes Relative: 10 %
Neutro Abs: 3.1 10*3/uL (ref 1.7–7.7)
Neutrophils Relative %: 61 %
Platelet Count: 277 10*3/uL (ref 150–400)
RBC: 4.51 MIL/uL (ref 3.87–5.11)
RDW: 16.7 % — ABNORMAL HIGH (ref 11.5–15.5)
WBC Count: 5 10*3/uL (ref 4.0–10.5)
nRBC: 0 % (ref 0.0–0.2)

## 2022-02-19 LAB — RETIC PANEL
Immature Retic Fract: 12.4 % (ref 2.3–15.9)
RBC.: 4.53 MIL/uL (ref 3.87–5.11)
Retic Count, Absolute: 87.9 10*3/uL (ref 19.0–186.0)
Retic Ct Pct: 1.9 % (ref 0.4–3.1)
Reticulocyte Hemoglobin: 25.8 pg — ABNORMAL LOW (ref >27.9)

## 2022-02-19 LAB — IRON AND IRON BINDING CAPACITY (CC-WL,HP ONLY)
Iron: 91 ug/dL (ref 28–170)
Saturation Ratios: 28 % (ref 10.4–31.8)
TIBC: 321 ug/dL (ref 250–450)
UIBC: 230 ug/dL (ref 148–442)

## 2022-02-19 LAB — VITAMIN B12: Vitamin B-12: 248 pg/mL (ref 180–914)

## 2022-02-19 LAB — FERRITIN: Ferritin: 78 ng/mL (ref 11–307)

## 2022-02-19 NOTE — Progress Notes (Unsigned)
[facility] Follow up:    Chelsea Perna, NP [address] [place] [address]   DIAGNOSIS: Cancer Staging  Malignant neoplasm of upper-outer quadrant of left breast in female, estrogen receptor positive ([place]) Staging form: Breast, AJCC 8th Edition - Clinical stage from 12/17/2020: Stage IA (cT1b, cN0, cM0, G2, ER+, PR+, HER2-) - Signed by [name], NP on 01/14/2022 Stage prefix: Initial diagnosis Histologic grading system: 3 grade system Laterality: Left Staged by: Pathologist and managing physician Stage used in treatment planning: Yes National guidelines used in treatment planning: Yes Type of national guideline used in treatment planning: NCCN - Pathologic stage from 07/09/2021: No Stage Recommended (ypTis (DCIS), pN0, cM0) - Signed by [name], NP on 07/22/2021 Stage prefix: Post-therapy  Malignant neoplasm of upper-outer quadrant of right breast in female, estrogen receptor positive ([place]) Staging form: Breast, AJCC 8th Edition - Clinical stage from 12/17/2020: Stage IIA (cT2, cN1, cM0, G2, ER+, PR+, HER2-) - Signed by [name], NP on 01/14/2022 Stage prefix: Initial diagnosis Histologic grading system: 3 grade system Laterality: Right Staged by: Pathologist and managing physician Stage used in treatment planning: Yes National guidelines used in treatment planning: Yes Type of national guideline used in treatment planning: NCCN - Pathologic stage from 07/09/2021: No Stage Recommended (ypT2, pN1a, cM0, G2) - Signed by [name], NP on 07/22/2021 Stage prefix: Post-therapy Histologic grading system: 3 grade system   SUMMARY OF ONCOLOGIC HISTORY: Oncology History  Malignant neoplasm of upper-outer quadrant of right breast in female, estrogen receptor positive ([place])  12/12/2020 Initial Diagnosis   status post bilateral breast biopsies 12/03/2020, showing             (1) on the right, a  clinical T2 N1, stage IIa invasive lobular carcinoma, grade 2, estrogen and progesterone receptor strongly positive, HER2 not amplified, with an MIB-1 of 40%                         (a) the biopsied right axillary lymph node was positive with extracapsular extension                         (b) a second right breast mass also biopsied was a fibroadenoma, concordant  MAMMAPRINT tested on biopsy returned high risk, luminal type B, indicating significant benefit from chemo                         (c) biopsy of an area of non-mass-like enhancement in the upper right breast pending   12/17/2020 Cancer Staging   Staging form: Breast, AJCC 8th Edition - Clinical stage from 12/17/2020: Stage IIA (cT2, cN1, cM0, G2, ER+, PR+, HER2-) - Signed by [name], NP on 01/14/2022 Stage prefix: Initial diagnosis Histologic grading system: 3 grade system Laterality: Right Staged by: Pathologist and managing physician Stage used in treatment planning: Yes National guidelines used in treatment planning: Yes Type of national guideline used in treatment planning: NCCN   12/24/2020 Genetic Testing   Negative genetic testing:  No pathogenic variants detected on the [employer] BRCAplus panel (report date 12/24/2020) or the CancerNext-Expanded + RNAinsight panel (report date 12/31/2020). 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   The BRCAplus panel offered by [employer] and includes sequencing and deletion/duplication analysis for the following 8 genes: ATM, BRCA1, BRCA2, CDH1, CHEK2, PALB2,  PTEN, and TP53. The CancerNext-Expanded + RNAinsight gene panel offered by [employer] and includes sequencing and rearrangement analysis for the following 77 genes: AIP, ALK, APC, ATM, AXIN2, BAP1, BARD1, BLM, BMPR1A, BRCA1, BRCA2, BRIP1, CDC73, CDH1, CDK4, CDKN1B, CDKN2A, CHEK2, CTNNA1, DICER1, FANCC, FH, FLCN, GALNT12, KIF1B, LZTR1, MAX, MEN1, MET, MLH1, MSH2, MSH3,  MSH6, MUTYH, NBN, NF1, NF2, NTHL1, PALB2, PHOX2B, PMS2, POT1, PRKAR1A, PTCH1, PTEN, RAD51C, RAD51D, RB1, RECQL, RET, SDHA, SDHAF2, SDHB, SDHC, SDHD, SMAD4, SMARCA4, SMARCB1, SMARCE1, STK11, SUFU, TMEM127, TP53, TSC1, TSC2, VHL and XRCC2 (sequencing and deletion/duplication); EGFR, EGLN1, HOXB13, KIT, MITF, PDGFRA, POLD1 and POLE (sequencing only); EPCAM and GREM1 (deletion/duplication only). RNA data is routinely analyzed for use in variant interpretation for all genes.   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, margins negative, 0/1 lymph node negative ER 95%, PR 100%   07/09/2021 Cancer Staging   Staging form: Breast, AJCC 8th Edition - Pathologic stage from 07/09/2021: No Stage Recommended (ypT2, pN1a, cM0, G2) - Signed by [name], NP on 07/22/2021 Stage prefix: Post-therapy Histologic grading system: 3 grade system   07/2021 -  Radiation Therapy   Adjuvant radiation to follow surgery   11/2021 -  Anti-estrogen oral therapy   Letrozole x 5-7 years   Malignant neoplasm of upper-outer quadrant of left breast in female, estrogen receptor positive ([place])  12/12/2020 Initial Diagnosis   on the left, a clinical T1b N0, stage IA invasive ductal carcinoma, grade 1 or 2, estrogen and progesterone receptor positive, HER2 not amplified, with an MIB 1 of 15%   12/17/2020 Cancer Staging   Staging form: Breast, AJCC 8th Edition - Clinical stage from 12/17/2020: Stage IA (cT1b, cN0, cM0, G2, ER+, PR+, HER2-) - Signed by [name], NP on 01/14/2022 Stage prefix:  Initial diagnosis Histologic grading system: 3 grade system Laterality: Left Staged by: Pathologist and managing physician Stage used in treatment planning: Yes National guidelines used in treatment planning: Yes Type of national guideline used in treatment planning: NCCN   12/24/2020 Genetic Testing   Negative genetic testing:  No pathogenic variants detected on the [facility] BRCAplus panel (report date 12/24/2020) or the CancerNext-Expanded + RNAinsight panel (report date 12/31/2020). 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   The BRCAplus panel offered by [employer] and includes sequencing and deletion/duplication analysis for the following 8 genes: ATM, BRCA1, BRCA2, CDH1, CHEK2, PALB2, PTEN, and TP53. The CancerNext-Expanded + RNAinsight gene panel offered by [employer] and includes sequencing and rearrangement analysis for the following 77 genes: AIP, ALK, APC, ATM, AXIN2, BAP1, BARD1, BLM, BMPR1A, BRCA1, BRCA2, BRIP1, CDC73, CDH1, CDK4, CDKN1B, CDKN2A, CHEK2, CTNNA1, DICER1, FANCC, FH, FLCN, GALNT12, KIF1B, LZTR1, MAX, MEN1, MET, MLH1, MSH2, MSH3, MSH6, MUTYH, NBN, NF1, NF2, NTHL1, PALB2, PHOX2B, PMS2, POT1, PRKAR1A, PTCH1, PTEN, RAD51C, RAD51D, RB1, RECQL, RET, SDHA, SDHAF2, SDHB, SDHC, SDHD, SMAD4, SMARCA4, SMARCB1, SMARCE1, STK11, SUFU, TMEM127, TP53, TSC1, TSC2, VHL and XRCC2 (sequencing and deletion/duplication); EGFR, EGLN1, HOXB13, KIT, MITF, PDGFRA, POLD1 and POLE (sequencing only); EPCAM and GREM1 (deletion/duplication only). RNA data is routinely analyzed for use in  variant interpretation for all genes.   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 female returns for f/u of her h/o breast cancer.  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  She is experiencing increased pain, first at her breasts bilaterally, and second at her hips bilaterally and radiation down to her feet.  She also has increased neuropathy.  She notes when she takes letrozole she is more fatigued.    Patient Active Problem List   Diagnosis Date Noted   PSVT (paroxysmal supraventricular tachycardia) (HCC) 07/08/2021   Pain management contract signed 05/04/2021   Chronic pain syndrome (breast cancer) 05/04/2021   Palpitations 03/25/2021   Sinus bradycardia 03/25/2021   Daytime somnolence 03/25/2021   Snoring 03/25/2021   Obesity (BMI 30-39.9) 03/25/2021   Port-A-Cath in place 01/27/2021   Genetic testing 12/24/2020   Family history of uterine cancer 12/17/2020   Malignant neoplasm of upper-outer quadrant of right breast in female, estrogen receptor positive (HCC) 12/12/2020   Malignant neoplasm of upper-outer quadrant of left breast in female, estrogen receptor positive (HCC) 12/12/2020   Hypokalemia 01/02/2012   Asthma 01/01/2012   Bradycardia 01/01/2012   Syncope 12/31/2011   Mediastinal adenopathy 12/31/2011   Anemia 12/31/2011   Chest pain 12/31/2011    has No Known Allergies.  MEDICAL HISTORY: Past Medical  History:  Diagnosis Date   Allergy    seasonal allergies   Anxiety    on meds   Asthma    uses inhaler   Breast cancer (HCC) 2012   RIGHT lumpectomy   Cancer (HCC) 2022   RIGHT breast lump-dx 2022   Depression    on meds   DVT (deep venous thrombosis) (HCC) 2010   after hysterectomy   Family history of uterine cancer    GERD (gastroesophageal reflux disease)    with certain foods/OTC PRN meds   Headache(784.0)    Hyperlipidemia    on meds   Hypertension    on meds   SVT (supraventricular tachycardia) (HCC)     SURGICAL HISTORY: Past Surgical History:  Procedure Laterality Date   ABDOMINAL HYSTERECTOMY     AXILLARY SENTINEL NODE BIOPSY Left 07/09/2021   Procedure: LEFT AXILLARY SENTINEL NODE BIOPSY;  Surgeon: Abigail Miyamoto, MD;  Location: [facility];  Service: General;  Laterality: Left;   BREAST EXCISIONAL BIOPSY Right 09/16/2009   BREAST LUMPECTOMY WITH RADIOACTIVE SEED LOCALIZATION Bilateral 07/09/2021   Procedure: BILATERAL BREAST LUMPECTOMY WITH RADIOACTIVE SEED LOCALIZATION;  Surgeon: Abigail Miyamoto, MD;  Location: [facility];  Service: General;  Laterality: Bilateral;  BREAST SURGERY     lumpectomy   PORT-A-CATH REMOVAL Left 07/09/2021   Procedure: REMOVAL PORT-A-CATH;  Surgeon: Abigail Miyamoto, MD;  Location: [facility];  Service: General;  Laterality: Left;   PORTACATH PLACEMENT Left 01/26/2021   Procedure: INSERTION PORT-A-CATH;  Surgeon: Abigail Miyamoto, MD;  Location: [facility];  Service: General;  Laterality: Left;   RADIOACTIVE SEED GUIDED AXILLARY SENTINEL LYMPH NODE Right 07/09/2021   Procedure: RADIOACTIVE SEED GUIDED RIGHT AXILLARY SENTINEL LYMPH NODE DISSECTION;  Surgeon: Abigail Miyamoto, MD;  Location: [facility];  Service: General;  Laterality: Right;   TUBAL LIGATION      SOCIAL HISTORY: Social History   Socioeconomic History   Marital status: Single    Spouse name: Not on file    Number of children: Not on file   Years of education: Not on file   Highest education level: Not on file  Occupational History   Not on file  Tobacco Use   Smoking status: Never   Smokeless tobacco: Never  Vaping Use   Vaping Use: Never used  Substance and Sexual Activity   Alcohol use: Not Currently    Alcohol/week: 7.0 standard drinks of alcohol    Types: 7 Standard drinks or equivalent per week    Comment: occassional   Drug use: No   Sexual activity: Not Currently  Other Topics Concern   Not on file  Social History Narrative   Not on file   Social Determinants of Health   Financial Resource Strain: High Risk (01/14/2022)   Overall Financial Resource Strain (CARDIA)    Difficulty of Paying Living Expenses: Very hard  Food Insecurity: Food Insecurity Present (01/14/2022)   Hunger Vital Sign    Worried About Running Out of Food in the Last Year: Sometimes true    Ran Out of Food in the Last Year: Sometimes true  Transportation Needs: No Transportation Needs (12/02/2020)   PRAPARE - Administrator, Civil Service (Medical): No    Lack of Transportation (Non-Medical): No  Physical Activity: Not on file  Stress: Not on file  Social Connections: Not on file  Intimate Partner Violence: Not on file    FAMILY HISTORY: Family History  Problem Relation Age of Onset   Breast cancer Mother        89s   Hypertension Father    Uterine cancer Maternal Aunt    Ovarian cancer Maternal Aunt    Breast cancer Maternal Aunt        66s   Breast cancer Cousin 55   Uterine cancer Cousin 65   Colon polyps Neg Hx    Colon cancer Neg Hx    Esophageal cancer Neg Hx    Stomach cancer Neg Hx     Review of Systems - Oncology    PHYSICAL EXAMINATION  ECOG PERFORMANCE STATUS: {CHL ONC ECOG JD:[id number]}  Vitals:   02/19/22 1328  BP: (!) 166/107  Pulse: 63  Resp: 14  Temp: 97.7 F (36.5 C)  SpO2: 99%    Physical Exam  LABORATORY DATA:  CBC    Component Value  Date/Time   WBC 5.0 02/19/2022 1324   WBC 3.6 (L) 06/08/2021 0817   RBC 4.53 02/19/2022 1324   RBC 4.51 02/19/2022 1324   HGB 10.7 (L) 02/19/2022 1324   HGB 11.5 10/06/2020 1432   HCT 33.4 (L) 02/19/2022 1324   HCT 36.8 10/06/2020 1432   PLT 277 02/19/2022 1324   PLT 323 10/06/2020 1432  MCV 74.1 (L) 02/19/2022 1324   MCV 73 (L) 10/06/2020 1432   MCH 23.7 (L) 02/19/2022 1324   MCHC 32.0 02/19/2022 1324   RDW 16.7 (H) 02/19/2022 1324   RDW 15.9 (H) 10/06/2020 1432   LYMPHSABS 1.1 02/19/2022 1324   LYMPHSABS 1.9 10/06/2020 1432   MONOABS 0.5 02/19/2022 1324   EOSABS 0.2 02/19/2022 1324   EOSABS 0.1 10/06/2020 1432   BASOSABS 0.0 02/19/2022 1324   BASOSABS 0.0 10/06/2020 1432    CMP     Component Value Date/Time   NA 140 01/14/2022 1256   NA 139 10/06/2020 1432   K 3.7 01/14/2022 1256   CL 105 01/14/2022 1256   CO2 30 01/14/2022 1256   GLUCOSE 122 (H) 01/14/2022 1256   BUN 12 01/14/2022 1256   BUN 11 10/06/2020 1432   CREATININE 0.84 01/14/2022 1256   CALCIUM 9.3 01/14/2022 1256   PROT 7.1 01/14/2022 1256   PROT 6.9 10/06/2020 1432   ALBUMIN 4.0 01/14/2022 1256   ALBUMIN 4.1 10/06/2020 1432   AST 15 01/14/2022 1256   ALT 14 01/14/2022 1256   ALKPHOS 97 01/14/2022 1256   BILITOT 0.4 01/14/2022 1256   GFRNONAA >60 01/14/2022 1256   GFRAA >60 09/07/2019 1340       PENDING LABS:   RADIOGRAPHIC STUDIES:  No results found.   PATHOLOGY:     ASSESSMENT and THERAPY PLAN:   No problem-specific Assessment & Plan notes found for this encounter.   No orders of the defined types were placed in this encounter.   All questions were answered. The patient knows to call the clinic with any problems, questions or concerns. We can certainly see the patient much sooner if necessary. This note was electronically signed. Scot Dock, NP 02/19/2022

## 2022-02-20 LAB — LUTEINIZING HORMONE: LH: 47.1 m[IU]/mL

## 2022-02-20 LAB — FOLLICLE STIMULATING HORMONE: FSH: 86.1 m[IU]/mL

## 2022-02-22 LAB — TOXASSURE SELECT 13 (MW), URINE

## 2022-02-23 ENCOUNTER — Other Ambulatory Visit: Payer: Self-pay

## 2022-02-23 ENCOUNTER — Telehealth: Payer: Self-pay | Admitting: Adult Health

## 2022-02-23 ENCOUNTER — Other Ambulatory Visit (HOSPITAL_COMMUNITY): Payer: Self-pay

## 2022-02-23 DIAGNOSIS — Z170 Estrogen receptor positive status [ER+]: Secondary | ICD-10-CM

## 2022-02-23 NOTE — Telephone Encounter (Signed)
Scheduled appointment per 9/1 los. Patient is aware.

## 2022-02-23 NOTE — Assessment & Plan Note (Signed)
Chelsea Hansen is here today for f/u of her bilateral breast cancer. She has no clinical or radiographic sign of breast cancer recurrence and continues on letrozole daily  I recommended that she hold the letrozole for 2 weeks to see if her increased side effect improved.  We also discussed that physical activity can help improve her fatigue.  She will continue to f/u with pain management for her percocet.    For her neuropathy I placed a referral to PT for evaluation and management.  She is due for repeat mammogram in 01/2023.    We will see her back in 4 weeks for virtual f/u.

## 2022-03-01 LAB — ESTRADIOL, ULTRA SENS: Estradiol, Sensitive: <2.5 pg/mL

## 2022-03-02 ENCOUNTER — Telehealth: Payer: Self-pay | Admitting: *Deleted

## 2022-03-02 LAB — ANTI-PARIETAL ANTIBODY: Parietal Cell Antibody-IgG: 1.3 Units (ref 0.0–20.0)

## 2022-03-02 LAB — MISC LABCORP TEST (SEND OUT)
Labcorp test code: 9985
Labcorp test code: 9985

## 2022-03-02 LAB — INTRINSIC FACTOR ANTIBODIES: Intrinsic Factor: 1 AU/mL (ref 0.0–1.1)

## 2022-03-02 NOTE — Telephone Encounter (Signed)
Per lindsey Causey,DNP, called pt with message below. Also, pt request to start back on estrogen blocker, per Marline Backbone, pt is to restart estrogen blocker and begin taking it in the morning. Left pt message pertaining to estrogen blocker. Advised to call back with questions.

## 2022-03-02 NOTE — Telephone Encounter (Signed)
-----   Message from [name], NP sent at 03/02/2022 10:04 AM EDT ----- Please let Chelsea Hansen know that I recommend that she take a B12 supplement of 1000 mcg daily.  The rest of her labs are stable and I would recommend that we talk about this further if she has any questions at her October appointment. ----- Message ----- From: Buel Ream, [facility] [place] Sent: 02/19/2022   1:31 PM EDT To: [name], NP

## 2022-03-03 NOTE — Therapy (Signed)
OUTPATIENT PHYSICAL THERAPY ONCOLOGY EVALUATION  Patient Name: Chelsea Hansen MRN: [id number] DOB:[birth year], 51 y.o., female Today's Date: 03/04/2022   PT End of Session - 03/04/22 1659     Visit Number 1    Number of Visits 12    PT Start Time 1604    PT Stop Time 1652    PT Time Calculation (min) 48 min    Activity Tolerance Patient tolerated treatment well    Behavior During Therapy Chelsea Hansen for tasks assessed/performed             Past Medical History:  Diagnosis Date   Allergy    seasonal allergies   Anxiety    on meds   Asthma    uses inhaler   Breast cancer ([place]) 2012   RIGHT lumpectomy   Cancer ([place]) 2022   RIGHT breast lump-dx 2022   Depression    on meds   DVT (deep venous thrombosis) ([place]) 2010   after hysterectomy   Family history of uterine cancer    GERD (gastroesophageal reflux disease)    with certain foods/OTC PRN meds   Headache(784.0)    Hyperlipidemia    on meds   Hypertension    on meds   SVT (supraventricular tachycardia) ([place])    Past Surgical History:  Procedure Laterality Date   ABDOMINAL HYSTERECTOMY     AXILLARY SENTINEL NODE BIOPSY Left 07/09/2021   Procedure: LEFT AXILLARY SENTINEL NODE BIOPSY;  Surgeon: Chelsea Keens, MD;  Location: [place];  Service: General;  Laterality: Left;   BREAST EXCISIONAL BIOPSY Right 09/16/2009   BREAST LUMPECTOMY WITH RADIOACTIVE SEED LOCALIZATION Bilateral 07/09/2021   Procedure: BILATERAL BREAST LUMPECTOMY WITH RADIOACTIVE SEED LOCALIZATION;  Surgeon: Chelsea Keens, MD;  Location: [place];  Service: General;  Laterality: Bilateral;   BREAST SURGERY     lumpectomy   PORT-A-CATH REMOVAL Left 07/09/2021   Procedure: REMOVAL PORT-A-CATH;  Surgeon: Chelsea Keens, MD;  Location: [place];  Service: General;  Laterality: Left;   PORTACATH PLACEMENT Left 01/26/2021   Procedure: INSERTION PORT-A-CATH;  Surgeon: Chelsea Keens, MD;  [facility];  Service: General;  Laterality: Left;   RADIOACTIVE SEED GUIDED AXILLARY SENTINEL LYMPH NODE Right 07/09/2021   Procedure: RADIOACTIVE SEED GUIDED RIGHT AXILLARY SENTINEL LYMPH NODE DISSECTION;  Surgeon: Chelsea Keens, MD;  Location: [place];  Service: General;  Laterality: Right;   TUBAL LIGATION     Patient Active Problem List   Diagnosis Date Noted   PSVT (paroxysmal supraventricular tachycardia) ([place]) 07/08/2021   Pain management contract signed 05/04/2021   Chronic pain syndrome (breast cancer) 05/04/2021   Palpitations 03/25/2021   Sinus bradycardia 03/25/2021   Daytime somnolence 03/25/2021   Snoring 03/25/2021   Obesity (BMI 30-39.9) 03/25/2021   Port-A-Cath in place 01/27/2021   Genetic testing 12/24/2020   Family history of uterine cancer 12/17/2020   Malignant neoplasm of upper-outer quadrant of right breast in female, estrogen receptor positive ([place]) 12/12/2020   Malignant neoplasm of upper-outer quadrant of left breast in female, estrogen receptor positive ([place]) 12/12/2020   Hypokalemia 01/02/2012   Asthma 01/01/2012   Bradycardia 01/01/2012   Syncope 12/31/2011   Mediastinal adenopathy 12/31/2011   Anemia 12/31/2011   Chest pain 12/31/2011    PCP: Chelsea Hansen,  REFERRING PROVIDER: Wilber Bihari, NP  REFERRING DIAG: Bilateral Breast swelling, decreased shoulder ROM bilaterally, Bilateral LE neuropathy  THERAPY DIAG:  Malignant neoplasm of upper-outer quadrant of right breast in female, estrogen  receptor positive ([place])  Malignant neoplasm of upper-outer quadrant of left breast in female, estrogen receptor positive ([name])  Abnormal posture  ONSET DATE: 01/2021  Rationale for Evaluation and Treatment Rehabilitation  SUBJECTIVE                                                                                                                                                                                           SUBJECTIVE  STATEMENT:  Pt had pain after surgery but pain became worse after radiation. Notices swelling at both breasts, and pain and tightness in bilateral axillary regions. Has pain in bilateral LE's all the way to her feet and her feet get numb and tingly since chemo.  She returned to work January 18, 2022. 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 She was given a lifting restriction of 10 lbs and she has to sit down every 45 min for 20 min. She has numbness in the balls of both feet that is intermittent. It has been improving. She has difficulty with reaching activities, and increased pain due to breast swelling and upper quarter restriction.    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 Yes NPRS scale: 8/10 Pain location: breast/ axillary region Pain orientation: Bilateral  PAIN TYPE: aching, burning, sharp, and tight Pain description: constant  Aggravating factors: household chores,lifting,sleeping,  Relieving factors: pain meds  PRECAUTIONS: Bilateral Lymphedema risk, neuropathy  WEIGHT BEARING RESTRICTIONS No  FALLS:  Has patient fallen in last 6 months? 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  HAND DOMINANCE : right   PRIOR LEVEL OF FUNCTION:  Independent  PATIENT GOALS stop the pain,be able to reach without pain, be able to clean   OBJECTIVE  COGNITION:  Overall cognitive status: Within functional limits for tasks assessed   PALPATION: Very tender throughout bilateral UQ  OBSERVATIONS / OTHER ASSESSMENTS: Both breast with generalized swelling and enlarged pores  SENSATION:  Light touch: Deficits  at lateral breast, medial arm inact     POSTURE: Forward head, rounded shoulders  UPPER EXTREMITY AROM/PROM:  A/PROM RIGHT   eval   Shoulder extension 42  Shoulder flexion 110 pulls under arm  Shoulder abduction 103  Shoulder internal rotation 55  Shoulder external rotation 79    (Blank rows = not tested)  A/PROM LEFT   eval  Shoulder extension 52  Shoulder flexion 120  Shoulder abduction 115  Shoulder internal rotation 55  Shoulder external rotation 80    (Blank rows = not tested)   CERVICAL AROM: All within functional limits:     UPPER EXTREMITY STRENGTH: NT due to pain  LOWER EXTREMITY MMT: 4+/5  LYMPHEDEMA ASSESSMENTS:   SURGERY TYPE/DATE: 07/09/2021 BILATERAL BREAST LUMPECTOMY WITH RADIOACTIVE SEED LOCALIZATION RADIOACTIVE SEED GUIDED RIGHT AXILLARY TARGETED LYMPH NODE DISSECTION LEFT AXILLARY SENTINEL NODE BIOPSY LEFT AXILLARY LYMPH NODE MAPPING WITH SENTIMAG  NUMBER OF LYMPH NODES REMOVED: Right 1/1, Left 0/1  CHEMOTHERAPY: neoadjuvant  RADIATION:yes bilaterally  HORMONE TREATMENT: yes  INFECTIONS: no  LYMPHEDEMA ASSESSMENTS:   LANDMARK RIGHT  eval  10 cm proximal to olecranon process 30.5  Olecranon process 27.1  10 cm proximal to ulnar styloid process 21.2  Just proximal to ulnar styloid process 16.6  Across hand at thumb web space 21.4  At base of 2nd digit 6.8  (Blank rows = not tested)  LANDMARK LEFT  eval  10 cm proximal to olecranon process 30.7  Olecranon process 26.7  10 cm proximal to ulnar styloid process 20.5  Just proximal to ulnar styloid process 16.3  Across  hand at thumb web space 21.1  At base of 2nd digit 6.7  (Blank rows = not tested)   FUNCTIONAL TESTS:   SLS ; 2-3 seconds ea leg GAIT: Distance walked: 20 Assistive device utilized: None Level of assistance: Complete Independence     QUICK DASH SURVEY: 77% BREAST COMPLAINTS SURVEY  :75  TODAY'S TREATMENT  Pt given script for compression bra and information to Purchase at second to [place]. Discussed POC and what she can expect with therapy.  PATIENT EDUCATION:  Education details: POC, Flexi touch, compression bra and gave script. (Pt in agreement with sending demo to [facility]) Person educated: Patient Education method: Explanation Education comprehension: verbalized understanding   HOME EXERCISE PROGRAM: None given today due to time  ASSESSMENT:  CLINICAL IMPRESSION: Patient is a 50 y.o. female who was seen today for physical therapy evaluation and treatment s/p bilateral breast lumpectomies on 07/09/2021 with neoadjuvant chemotherapy and bilateral breast radiation following. She presents with significant complaints of pain and tightness in bilateral shoulders with decreased ROM, significant bilateral breast edema with enlarged pores and tenderness to palpation throughout bilateral upper quarters. She will benefit from skilled therpay to address deficits and return to PLOF.   OBJECTIVE IMPAIRMENTS decreased activity tolerance, decreased balance, decreased knowledge of condition, difficulty walking, decreased ROM, increased edema, increased fascial restrictions, impaired sensation, impaired UE functional use, postural dysfunction, and pain.   ACTIVITY LIMITATIONS carrying, lifting, sleeping, bathing, dressing, and reach over head  PARTICIPATION LIMITATIONS: cleaning, occupation, and yard work  PERSONAL FACTORS 3+ comorbidities: Bilateral breast Cancer with chemo/radiation, pain management,  are also affecting patient's functional outcome.   REHAB POTENTIAL:  Good  CLINICAL DECISION MAKING: Stable/uncomplicated  EVALUATION COMPLEXITY: Low  GOALS: Goals reviewed with patient? Yes  SHORT TERM GOALS: Target date: 03/25/2022    Pt will be independent and compliant with a HEP for shoulder ROM and strength Baseline: Goal  status: INITIAL  2.  Pt will have improved bilateral shoulder flexion and abd to atleast 135 Baseline:  Goal status: INITIAL 3. Pt will have Flexi touch trial for breast lymphedema  LONG TERM GOALS: Target date: 04/15/2022    Pt will have bilateral shoulder flexion atleast 145 degrees and abduction atleast 160 degrees for improved reaching ability Baseline:  Goal status: INITIAL  2.  Pts quick dash will be improved to no greater than 25% for improved function Baseline:  Goal status: INITIAL  3.  Pt will be independent in self MLD to decrease bilateral breast swelling Baseline:  Goal status: INITIAL  4.  Pt will purchase a compression bra to decrease breast swelling Baseline:  Goal status: INITIAL  5.  Pt will be able to dress, bathe and perform reaching activities with minimal pain. Baseline:  Goal status: INITIAL     PLAN: PT FREQUENCY: 2x/week  PT DURATION: 12 weeks  PLANNED INTERVENTIONS: Therapeutic exercises, Therapeutic activity, Neuromuscular re-education, Balance training, Patient/Family education, Self Care, Joint mobilization, Orthotic/Fit training, Manual lymph drainage, scar mobilization, Vasopneumatic device, Manual therapy, and Re-evaluation  PLAN FOR NEXT SESSION: Check compression bra if she has,Initiate HEP for shoulder ROM, STM prn to decrease pain and tightness, initiate and instruct breast MLD. Demo sent to [employer], PT 03/04/2022, 5:03 PM

## 2022-03-04 ENCOUNTER — Other Ambulatory Visit: Payer: Self-pay

## 2022-03-04 ENCOUNTER — Ambulatory Visit: Payer: Medicaid Other | Attending: Adult Health

## 2022-03-04 DIAGNOSIS — L599 Disorder of the skin and subcutaneous tissue related to radiation, unspecified: Secondary | ICD-10-CM | POA: Diagnosis present

## 2022-03-04 DIAGNOSIS — R293 Abnormal posture: Secondary | ICD-10-CM | POA: Insufficient documentation

## 2022-03-04 DIAGNOSIS — Z170 Estrogen receptor positive status [ER+]: Secondary | ICD-10-CM | POA: Diagnosis present

## 2022-03-04 DIAGNOSIS — C50412 Malignant neoplasm of upper-outer quadrant of left female breast: Secondary | ICD-10-CM | POA: Diagnosis present

## 2022-03-04 DIAGNOSIS — I890 Lymphedema, not elsewhere classified: Secondary | ICD-10-CM | POA: Insufficient documentation

## 2022-03-04 DIAGNOSIS — R209 Unspecified disturbances of skin sensation: Secondary | ICD-10-CM | POA: Insufficient documentation

## 2022-03-04 DIAGNOSIS — C50411 Malignant neoplasm of upper-outer quadrant of right female breast: Secondary | ICD-10-CM | POA: Insufficient documentation

## 2022-03-11 ENCOUNTER — Ambulatory Visit: Payer: Medicaid Other

## 2022-03-15 ENCOUNTER — Other Ambulatory Visit (HOSPITAL_COMMUNITY): Payer: Self-pay

## 2022-03-18 ENCOUNTER — Ambulatory Visit: Payer: Medicaid Other

## 2022-03-18 DIAGNOSIS — I890 Lymphedema, not elsewhere classified: Secondary | ICD-10-CM

## 2022-03-18 DIAGNOSIS — R209 Unspecified disturbances of skin sensation: Secondary | ICD-10-CM

## 2022-03-18 DIAGNOSIS — L599 Disorder of the skin and subcutaneous tissue related to radiation, unspecified: Secondary | ICD-10-CM

## 2022-03-18 DIAGNOSIS — R293 Abnormal posture: Secondary | ICD-10-CM

## 2022-03-18 DIAGNOSIS — Z170 Estrogen receptor positive status [ER+]: Secondary | ICD-10-CM

## 2022-03-18 DIAGNOSIS — C50411 Malignant neoplasm of upper-outer quadrant of right female breast: Secondary | ICD-10-CM | POA: Diagnosis not present

## 2022-03-18 NOTE — Patient Instructions (Signed)
SHOULDER: Flexion - Supine (Cane)        Cancer Rehab [phone]    Hold cane in both hands. Raise arms up overhead. Do not allow back to arch. Hold _5__ seconds. Do __5-10__ times; __1-2__ times a day. Hands shoulder width Hands slightly wider than shoulder width apart. Y position    Shoulder Blade Stretch    Clasp fingers behind head with elbows touching in front of face. Pull elbows back while pressing shoulder blades together. Relax and hold as tolerated, can place pillow under elbow here for comfort as needed and to allow for prolonged stretch.  Repeat __5__ times. Do __1-2__ sessions per day.   Copyright  VHI. All rights reserved.

## 2022-03-18 NOTE — Therapy (Signed)
OUTPATIENT PHYSICAL THERAPY ONCOLOGY TREATMENT  Patient Name: Chelsea Hansen MRN: [id number] DOB:06-27-1970, 51 y.o., female Today's Date: 03/18/2022   PT End of Session - 03/18/22 0801     Visit Number 2    Number of Visits 12    Date for PT Re-Evaluation 04/15/22    Authorization Type Medicaid    Authorization Time Period 30 days    Authorization - Visit Number 1    Authorization - Number of Visits 3    Progress Note Due on Visit 3   or 30 days   PT Start Time 0802    PT Stop Time 0850    PT Time Calculation (min) 48 min    Activity Tolerance Patient tolerated treatment well    Behavior During Therapy [facility] for tasks assessed/performed             Past Medical History:  Diagnosis Date   Allergy    seasonal allergies   Anxiety    on meds   Asthma    uses inhaler   Breast cancer ([place]) 2012   RIGHT lumpectomy   Cancer ([place]) 2022   RIGHT breast lump-dx 2022   Depression    on meds   DVT (deep venous thrombosis) ([place]) 2010   after hysterectomy   Family history of uterine cancer    GERD (gastroesophageal reflux disease)    with certain foods/OTC PRN meds   Headache(784.0)    Hyperlipidemia    on meds   Hypertension    on meds   SVT (supraventricular tachycardia) ([place])    Past Surgical History:  Procedure Laterality Date   ABDOMINAL HYSTERECTOMY     AXILLARY SENTINEL NODE BIOPSY Left 07/09/2021   Procedure: LEFT AXILLARY SENTINEL NODE BIOPSY;  Surgeon: [name], MD;  Location: [place];  Service: General;  Laterality: Left;   BREAST EXCISIONAL BIOPSY Right 09/16/2009   BREAST LUMPECTOMY WITH RADIOACTIVE SEED LOCALIZATION Bilateral 07/09/2021   Procedure: BILATERAL BREAST LUMPECTOMY WITH RADIOACTIVE SEED LOCALIZATION;  Surgeon: [name], MD;  Location: [place];  Service: General;  Laterality: Bilateral;   BREAST SURGERY     lumpectomy   PORT-A-CATH REMOVAL Left 07/09/2021   Procedure: REMOVAL PORT-A-CATH;   Surgeon: [name], MD;  Location: [place];  Service: General;  Laterality: Left;   PORTACATH PLACEMENT Left 01/26/2021   Procedure: INSERTION PORT-A-CATH;  Surgeon: [name], MD;  [facility];  Service: General;  Laterality: Left;   RADIOACTIVE SEED GUIDED AXILLARY SENTINEL LYMPH NODE Right 07/09/2021   Procedure: RADIOACTIVE SEED GUIDED RIGHT AXILLARY SENTINEL LYMPH NODE DISSECTION;  Surgeon: [name], MD;  Location: [place];  Service: General;  Laterality: Right;   TUBAL LIGATION     Patient Active Problem List   Diagnosis Date Noted   PSVT (paroxysmal supraventricular tachycardia) ([place]) 07/08/2021   Pain management contract signed 05/04/2021   Chronic pain syndrome (breast cancer) 05/04/2021   Palpitations 03/25/2021   Sinus bradycardia 03/25/2021   Daytime somnolence 03/25/2021   Snoring 03/25/2021   Obesity (BMI 30-39.9) 03/25/2021   Port-A-Cath in place 01/27/2021   Genetic testing 12/24/2020   Family history of uterine cancer 12/17/2020   Malignant neoplasm of upper-outer quadrant of right breast in female, estrogen receptor positive ([place]) 12/12/2020   Malignant neoplasm of upper-outer quadrant of left breast in female, estrogen receptor positive ([place]) 12/12/2020   Hypokalemia 01/02/2012   Asthma 01/01/2012   Bradycardia 01/01/2012   Syncope 12/31/2011  Mediastinal adenopathy 12/31/2011   Anemia 12/31/2011   Chest pain 12/31/2011    PCP: Juluis Mire,  REFERRING PROVIDER: Wilber Bihari, NP  REFERRING DIAG: Bilateral Breast swelling, decreased shoulder ROM bilaterally, Bilateral LE neuropathy  THERAPY DIAG:  Malignant neoplasm of upper-outer quadrant of right breast in female, estrogen receptor positive ([place])  Malignant neoplasm of upper-outer quadrant of left breast in female, estrogen receptor positive ([place])  Abnormal posture  Disturbance of skin sensation  Disorder of the skin and  subcutaneous tissue related to radiation, unspecified  Lymphedema, not elsewhere classified  ONSET DATE: 01/2021  Rationale for Evaluation and Treatment Rehabilitation  SUBJECTIVE                                                                                                                                                                                           SUBJECTIVE STATEMENT:  got my compression bra and it does help a lot. I still see the enlarged pores at night. Advised pt that [facility] will be calling.  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 Yes NPRS scale: 8/10 Pain location: breast/ axillary region Pain orientation: Bilateral  PAIN TYPE: aching, burning, sharp, and tight Pain description: constant  Aggravating factors: household chores,lifting,sleeping,  Relieving factors: pain meds  PRECAUTIONS: Bilateral Lymphedema risk, neuropathy  WEIGHT BEARING RESTRICTIONS No  FALLS:  Has patient fallen in last 6 months? 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  HAND DOMINANCE : right   PRIOR LEVEL OF FUNCTION: Independent  PATIENT GOALS stop the pain,be able to reach without pain, be able to clean   OBJECTIVE  COGNITION:  Overall  cognitive status: Within functional limits for tasks assessed   PALPATION: Very tender throughout bilateral UQ  OBSERVATIONS / OTHER ASSESSMENTS: Both breast with generalized swelling and enlarged pores  SENSATION:  Light touch: Deficits at lateral breast, medial arm inact     POSTURE: Forward head, rounded shoulders  UPPER EXTREMITY AROM/PROM:  A/PROM RIGHT  eval   Shoulder extension 42  Shoulder flexion 110 pulls under arm  Shoulder abduction 103  Shoulder internal rotation 55  Shoulder external rotation 79    (Blank rows = not tested)  A/PROM LEFT   eval  Shoulder extension 52  Shoulder flexion 120  Shoulder abduction 115  Shoulder internal rotation 55  Shoulder external rotation 80    (Blank rows = not tested)   CERVICAL AROM: All within functional limits:     UPPER EXTREMITY STRENGTH: NT due to pain  LOWER EXTREMITY MMT: 4+/5  LYMPHEDEMA ASSESSMENTS:   SURGERY TYPE/DATE: 07/09/2021 BILATERAL BREAST LUMPECTOMY WITH RADIOACTIVE SEED LOCALIZATION RADIOACTIVE SEED GUIDED RIGHT AXILLARY TARGETED LYMPH NODE DISSECTION LEFT AXILLARY SENTINEL NODE BIOPSY LEFT AXILLARY LYMPH NODE MAPPING WITH SENTIMAG  NUMBER OF LYMPH NODES REMOVED: Right 1/1, Left 0/1  CHEMOTHERAPY: neoadjuvant  RADIATION:yes bilaterally  HORMONE TREATMENT: yes  INFECTIONS: no  LYMPHEDEMA ASSESSMENTS:   LANDMARK RIGHT  eval  10 cm proximal to olecranon process 30.5  Olecranon process 27.1  10 cm proximal to ulnar styloid process 21.2  Just proximal to ulnar styloid process 16.6  Across hand at thumb web space 21.4  At base of 2nd digit 6.8  (Blank rows = not tested)  LANDMARK LEFT  eval  10 cm proximal to olecranon process 30.7  Olecranon process 26.7  10 cm proximal to ulnar styloid process 20.5  Just proximal to ulnar styloid process 16.3  Across hand at thumb web space 21.1  At base of 2nd digit 6.7  (Blank rows = not tested)   FUNCTIONAL TESTS:   SLS ; 2-3 seconds  ea leg GAIT: Distance walked: 20 Assistive device utilized: None Level of assistance: Complete Independence     QUICK DASH SURVEY: 77% BREAST COMPLAINTS SURVEY  :75  TODAY'S TREATMENT   03/18/2022 Assessed bilateral breasts; medial breasts still with very enlarged pores. Instructed pt in 4 post op exercises to improve ROM with supine clasped hands flexion and star gazer x 5 ea., supine wand flexion and scaption x 5, standing wall slides and scapular retraction x 5 ea, Lat counter stretch x 5 Soft tissue mobilization to bilateral pectorals, UT, and lateral trunk with cocoa butter in supine    03/04/2022 Pt given script for compression bra and information to Purchase at second to [place]. Discussed POC and what she can expect with therapy.  PATIENT EDUCATION:  Education details: POC, Flexi touch, compression bra and gave script. (Pt in agreement with sending demo to [facility]) Person educated: Patient Education method: Explanation Education comprehension: verbalized understanding   HOME EXERCISE PROGRAM: None given today due to time  ASSESSMENT:  CLINICAL IMPRESSION: Pt is still very guarded but did well with new exercises instructed and did show improvements in ROM, especially when performed after manual therapy. Reiterated to pt the importance of doing exercises several times per day if she is to improve. She required occasional VC's throughout to relax.She should be hearing from [facility] soon.  OBJECTIVE IMPAIRMENTS decreased activity tolerance, decreased balance, decreased knowledge of condition, difficulty walking, decreased ROM, increased edema, increased fascial restrictions, impaired sensation, impaired UE functional use, postural dysfunction, and pain.   ACTIVITY LIMITATIONS carrying, lifting, sleeping, bathing, dressing, and reach over head  PARTICIPATION LIMITATIONS: cleaning, occupation, and yard work  PERSONAL FACTORS 3+ comorbidities: Bilateral  breast Cancer with chemo/radiation, pain management,  are also affecting patient's functional outcome.   REHAB POTENTIAL: Good  CLINICAL DECISION MAKING: Stable/uncomplicated  EVALUATION COMPLEXITY: Low  GOALS: Goals  reviewed with patient? Yes  SHORT TERM GOALS: Target date: 04/08/2022    Pt will be independent and compliant with a HEP for shoulder ROM and strength Baseline: Goal status: INITIAL  2.  Pt will have improved bilateral shoulder flexion and abd to atleast 135 Baseline:  Goal status: INITIAL 3. Pt will have Flexi touch trial for breast lymphedema  LONG TERM GOALS: Target date: 04/29/2022    Pt will have bilateral shoulder flexion atleast 145 degrees and abduction atleast 160 degrees for improved reaching ability Baseline:  Goal status: INITIAL  2.  Pts quick dash will be improved to no greater than 25% for improved function Baseline:  Goal status: INITIAL  3.  Pt will be independent in self MLD to decrease bilateral breast swelling Baseline:  Goal status: INITIAL  4.  Pt will purchase a compression bra to decrease breast swelling Baseline:  Goal status: INITIAL  5.  Pt will be able to dress, bathe and perform reaching activities with minimal pain. Baseline:  Goal status: INITIAL     PLAN: PT FREQUENCY: 2x/week  PT DURATION: 12 weeks  PLANNED INTERVENTIONS: Therapeutic exercises, Therapeutic activity, Neuromuscular re-education, Balance training, Patient/Family education, Self Care, Joint mobilization, Orthotic/Fit training, Manual lymph drainage, scar mobilization, Vasopneumatic device, Manual therapy, and Re-evaluation  PLAN FOR NEXT SESSION:cont  shoulder ROM, STM prn to decrease pain and tightness, initiate and instruct Bilateral breast MLD. Demo sent to [employer], PT 03/18/2022, 8:54 AM

## 2022-03-22 ENCOUNTER — Other Ambulatory Visit (HOSPITAL_COMMUNITY): Payer: Self-pay

## 2022-03-22 ENCOUNTER — Encounter: Payer: Self-pay | Admitting: Adult Health

## 2022-03-22 ENCOUNTER — Inpatient Hospital Stay: Payer: Medicaid Other | Attending: Adult Health | Admitting: Adult Health

## 2022-03-22 DIAGNOSIS — C50411 Malignant neoplasm of upper-outer quadrant of right female breast: Secondary | ICD-10-CM | POA: Diagnosis not present

## 2022-03-22 DIAGNOSIS — M7989 Other specified soft tissue disorders: Secondary | ICD-10-CM | POA: Insufficient documentation

## 2022-03-22 DIAGNOSIS — Z170 Estrogen receptor positive status [ER+]: Secondary | ICD-10-CM | POA: Insufficient documentation

## 2022-03-22 DIAGNOSIS — C50412 Malignant neoplasm of upper-outer quadrant of left female breast: Secondary | ICD-10-CM | POA: Insufficient documentation

## 2022-03-22 DIAGNOSIS — Z79811 Long term (current) use of aromatase inhibitors: Secondary | ICD-10-CM | POA: Insufficient documentation

## 2022-03-22 MED ORDER — ANASTROZOLE 1 MG PO TABS
1.0000 mg | ORAL_TABLET | Freq: Every day | ORAL | 3 refills | Status: DC
  Filled 2022-03-22: qty 90, 90d supply, fill #0
  Filled 2022-06-17: qty 90, 90d supply, fill #1
  Filled 2022-09-16: qty 90, 90d supply, fill #2
  Filled 2023-01-31: qty 90, 90d supply, fill #3

## 2022-03-22 NOTE — Assessment & Plan Note (Signed)
Chelsea Hansen is a 51 year old woman with bilateral estrogen positive breast cancer here today for follow-up and evaluation.  She has experienced difficulty with tolerating letrozole.  We discussed that she will stay off of letrozole and we will change her to a different pill called anastrozole.  I reviewed with her that the side effect profile of anastrozole and letrozole is quite similar however patients often will tolerate 1 better than the other.  [name] verbalized understanding with this.  She knows to call me if she cannot tolerate this new medication.  We will see her back in March 2024 or sooner if she continues to struggle with antiestrogen therapy.

## 2022-03-22 NOTE — Progress Notes (Signed)
[facility] Follow up:    [name], NP [id number] [id number]   DIAGNOSIS:  Cancer Staging  Malignant neoplasm of upper-outer quadrant of left breast in female, estrogen receptor positive ([place]) Staging form: Breast, AJCC 8th Edition - Clinical stage from 12/17/2020: Stage IA (cT1b, cN0, cM0, G2, ER+, PR+, HER2-) - Signed by [name], NP on 01/14/2022 Stage prefix: Initial diagnosis Histologic grading system: 3 grade system Laterality: Left Staged by: Pathologist and managing physician Stage used in treatment planning: Yes National guidelines used in treatment planning: Yes Type of national guideline used in treatment planning: NCCN - Pathologic stage from 07/09/2021: No Stage Recommended (ypTis (DCIS), pN0, cM0) - Signed by [name], NP on 07/22/2021 Stage prefix: Post-therapy  Malignant neoplasm of upper-outer quadrant of right breast in female, estrogen receptor positive ([place]) Staging form: Breast, AJCC 8th Edition - Clinical stage from 12/17/2020: Stage IIA (cT2, cN1, cM0, G2, ER+, PR+, HER2-) - Signed by [name], NP on 01/14/2022 Stage prefix: Initial diagnosis Histologic grading system: 3 grade system Laterality: Right Staged by: Pathologist and managing physician Stage used in treatment planning: Yes National guidelines used in treatment planning: Yes Type of national guideline used in treatment planning: NCCN - Pathologic stage from 07/09/2021: No Stage Recommended (ypT2, pN1a, cM0, G2) - Signed by [name], NP on 07/22/2021 Stage prefix: Post-therapy Histologic grading system: 3 grade system  I connected with Chelsea Hansen on 03/22/22 at  1:15 PM EDT by telephone and verified that I am speaking with the correct person using two identifiers.  I discussed the limitations, risks, security and privacy concerns of performing an evaluation and management service by telephone  and the availability of in person appointments.  I also discussed with the patient that there may be a patient responsible charge related to this service. The patient expressed understanding and agreed to proceed.  Patient location: home Provider location: [facility] office  SUMMARY OF ONCOLOGIC HISTORY: Oncology History  Malignant neoplasm of upper-outer quadrant of right breast in female, estrogen receptor positive ([place])  12/12/2020 Initial Diagnosis   status post bilateral breast biopsies 12/03/2020, showing             (1) on the right, a clinical T2 N1, stage IIa invasive lobular carcinoma, grade 2, estrogen and progesterone receptor strongly positive, HER2 not amplified, with an MIB-1 of 40%                         (a) the biopsied right axillary lymph node was positive with extracapsular extension                         (b) a second right breast mass also biopsied was a fibroadenoma, concordant  MAMMAPRINT tested on biopsy returned high risk, luminal type B, indicating significant benefit from chemo                         (c) biopsy of an area of non-mass-like enhancement in the upper right breast pending   12/17/2020 Cancer Staging   Staging form: Breast, AJCC 8th Edition - Clinical stage from 12/17/2020: Stage IIA (cT2, cN1, cM0, G2, ER+, PR+, HER2-) - Signed by [name], NP on 01/14/2022 Stage prefix: Initial diagnosis Histologic grading system: 3 grade system Laterality: Right Staged by: Pathologist and managing physician Stage used in treatment planning: Yes  National guidelines used in treatment planning: Yes Type of national guideline used in treatment planning: NCCN   12/24/2020 Genetic Testing   Negative genetic testing:  No pathogenic variants detected on the [employer] BRCAplus panel (report date 12/24/2020) or the CancerNext-Expanded + RNAinsight panel (report date 12/31/2020). 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   The BRCAplus panel offered by [employer] and includes sequencing and deletion/duplication analysis for the following 8 genes: ATM, BRCA1, BRCA2, CDH1, CHEK2, PALB2, PTEN, and TP53. The CancerNext-Expanded + RNAinsight gene panel offered by [employer] and includes sequencing and rearrangement analysis for the following 77 genes: AIP, ALK, APC, ATM, AXIN2, BAP1, BARD1, BLM, BMPR1A, BRCA1, BRCA2, BRIP1, CDC73, CDH1, CDK4, CDKN1B, CDKN2A, CHEK2, CTNNA1, DICER1, FANCC, FH, FLCN, GALNT12, KIF1B, LZTR1, MAX, MEN1, MET, MLH1, MSH2, MSH3, MSH6, MUTYH, NBN, NF1, NF2, NTHL1, PALB2, PHOX2B, PMS2, POT1, PRKAR1A, PTCH1, PTEN, RAD51C, RAD51D, RB1, RECQL, RET, SDHA, SDHAF2, SDHB, SDHC, SDHD, SMAD4, SMARCA4, SMARCB1, SMARCE1, STK11, SUFU, TMEM127, TP53, TSC1, TSC2, VHL and XRCC2 (sequencing and deletion/duplication); EGFR, EGLN1, HOXB13, KIT, MITF, PDGFRA, POLD1 and POLE (sequencing only); EPCAM and GREM1 (deletion/duplication only). RNA data is routinely analyzed for use in variant interpretation for all genes.   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, margins negative, 0/1 lymph node negative ER 95%, PR 100%   07/09/2021 Cancer Staging   Staging form: Breast, AJCC 8th Edition - Pathologic stage from 07/09/2021: No Stage Recommended (ypT2, pN1a, cM0, G2) - Signed by [name], NP on 07/22/2021 Stage prefix: Post-therapy Histologic grading system: 3 grade system   07/2021 -  Radiation Therapy   Adjuvant radiation to follow surgery   11/2021 -  Anti-estrogen oral  therapy   Letrozole x 5-7 years   Malignant neoplasm of upper-outer quadrant of left breast in female, estrogen receptor positive ([place])  12/12/2020 Initial Diagnosis   on the left, a clinical T1b N0, stage IA invasive ductal carcinoma, grade 1 or 2, estrogen and progesterone receptor positive, HER2 not amplified, with an MIB 1 of 15%   12/17/2020 Cancer Staging   Staging form: Breast, AJCC 8th Edition - Clinical stage from 12/17/2020: Stage IA (cT1b, cN0, cM0, G2, ER+, PR+, HER2-) - Signed by [name], NP on 01/14/2022 Stage prefix: Initial diagnosis Histologic grading system: 3 grade system Laterality: Left Staged by: Pathologist and managing physician Stage used in treatment planning: Yes National guidelines used in treatment planning: Yes Type of national guideline used in treatment planning: NCCN   12/24/2020 Genetic Testing   Negative genetic testing:  No pathogenic variants detected on the [employer] BRCAplus panel (report date 12/24/2020) or the CancerNext-Expanded + RNAinsight panel (report date 12/31/2020). 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   The BRCAplus panel offered by [employer] and includes sequencing and deletion/duplication analysis for the following 8 genes: ATM, BRCA1, BRCA2, CDH1, CHEK2, PALB2, PTEN, and TP53. The CancerNext-Expanded + RNAinsight gene panel offered by [employer] and includes sequencing and rearrangement analysis for the following 77 genes: AIP, ALK, APC, ATM, AXIN2, BAP1,  BARD1, BLM, BMPR1A, BRCA1, BRCA2, BRIP1, CDC73, CDH1, CDK4, CDKN1B, CDKN2A, CHEK2, CTNNA1, DICER1, FANCC, FH, FLCN, GALNT12, KIF1B, LZTR1, MAX, MEN1, MET, MLH1, MSH2, MSH3, MSH6, MUTYH, NBN, NF1, NF2, NTHL1, PALB2, PHOX2B, PMS2, POT1, PRKAR1A, PTCH1, PTEN, RAD51C, RAD51D, RB1, RECQL, RET, SDHA, SDHAF2, SDHB, SDHC, SDHD, SMAD4, SMARCA4, SMARCB1, SMARCE1, STK11, SUFU, TMEM127, TP53, TSC1, TSC2, VHL and XRCC2 (sequencing  and deletion/duplication); EGFR, EGLN1, HOXB13, KIT, MITF, PDGFRA, POLD1 and POLE (sequencing only); EPCAM and GREM1 (deletion/duplication only). RNA data is routinely analyzed for use in variant interpretation for all genes.   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 female returns for follow-up and evaluation of her right-sided breast cancer.  She had previously been on holiday for a couple weeks off of letrozole due to an increase in pain in her joints and aching down her legs.  She notes that when she was off the letrozole that this pain had resolved.  She wants to know moving forward what pill she should take and what the side effects might be.  He tells me that she really appreciated the physical therapy referral.  They have done wonders with working with her and she even was set up to get fitted for bras and will receive them next week.   Patient Active Problem List   Diagnosis Date Noted   PSVT (paroxysmal supraventricular tachycardia) 07/08/2021   Pain management contract signed  05/04/2021   Chronic pain syndrome (breast cancer) 05/04/2021   Palpitations 03/25/2021   Sinus bradycardia 03/25/2021   Daytime somnolence 03/25/2021   Snoring 03/25/2021   Obesity (BMI 30-39.9) 03/25/2021   Port-A-Cath in place 01/27/2021   Genetic testing 12/24/2020   Family history of uterine cancer 12/17/2020   Malignant neoplasm of upper-outer quadrant of right breast in female, estrogen receptor positive ([place]) 12/12/2020   Malignant neoplasm of upper-outer quadrant of left breast in female, estrogen receptor positive ([place]) 12/12/2020   Hypokalemia 01/02/2012   Asthma 01/01/2012   Bradycardia 01/01/2012   Syncope 12/31/2011   Mediastinal adenopathy 12/31/2011   Anemia 12/31/2011   Chest pain 12/31/2011    has No Known Allergies.  MEDICAL HISTORY: Past Medical History:  Diagnosis Date   Allergy    seasonal allergies   Anxiety    on meds   Asthma    uses inhaler   Breast cancer ([place]) 2012   RIGHT lumpectomy   Cancer ([place]) 2022   RIGHT breast lump-dx 2022   Depression    on meds   DVT (deep venous thrombosis) ([place]) 2010   after hysterectomy   Family history of uterine cancer  GERD (gastroesophageal reflux disease)    with certain foods/OTC PRN meds   Headache(784.0)    Hyperlipidemia    on meds   Hypertension    on meds   SVT (supraventricular tachycardia)     SURGICAL HISTORY: Past Surgical History:  Procedure Laterality Date   ABDOMINAL HYSTERECTOMY     AXILLARY SENTINEL NODE BIOPSY Left 07/09/2021   Procedure: LEFT AXILLARY SENTINEL NODE BIOPSY;  Surgeon: [name], MD;  Location: [place];  Service: General;  Laterality: Left;   BREAST EXCISIONAL BIOPSY Right 09/16/2009   BREAST LUMPECTOMY WITH RADIOACTIVE SEED LOCALIZATION Bilateral 07/09/2021   Procedure: BILATERAL BREAST LUMPECTOMY WITH RADIOACTIVE SEED LOCALIZATION;  Surgeon: [name], MD;  Location: [place];  Service: General;  Laterality:  Bilateral;   BREAST SURGERY     lumpectomy   PORT-A-CATH REMOVAL Left 07/09/2021   Procedure: REMOVAL PORT-A-CATH;  Surgeon: [name], MD;  Location: [place];  Service: General;  Laterality: Left;   PORTACATH PLACEMENT Left 01/26/2021   Procedure: INSERTION PORT-A-CATH;  Surgeon: [name], MD;  Location: [facility];  Service: General;  Laterality: Left;   RADIOACTIVE SEED GUIDED AXILLARY SENTINEL LYMPH NODE Right 07/09/2021   Procedure: RADIOACTIVE SEED GUIDED RIGHT AXILLARY SENTINEL LYMPH NODE DISSECTION;  Surgeon: [name], MD;  Location: [place];  Service: General;  Laterality: Right;   TUBAL LIGATION      SOCIAL HISTORY: Social History   Socioeconomic History   Marital status: Single    Spouse name: Not on file   Number of children: Not on file   Years of education: Not on file   Highest education level: Not on file  Occupational History   Not on file  Tobacco Use   Smoking status: Never   Smokeless tobacco: Never  Vaping Use   Vaping Use: Never used  Substance and Sexual Activity   Alcohol use: Not Currently    Alcohol/week: 7.0 standard drinks of alcohol    Types: 7 Standard drinks or equivalent per week    Comment: occassional   Drug use: No   Sexual activity: Not Currently  Other Topics Concern   Not on file  Social History Narrative   Not on file   Social Determinants of Health   Financial Resource Strain: High Risk (01/14/2022)   Overall Financial Resource Strain (CARDIA)    Difficulty of Paying Living Expenses: Very hard  Food Insecurity: Food Insecurity Present (01/14/2022)   Hunger Vital Sign    Worried About Running Out of Food in the Last Year: Sometimes true    Ran Out of Food in the Last Year: Sometimes true  Transportation Needs: No Transportation Needs (12/02/2020)   PRAPARE - Hydrologist (Medical): No    Lack of Transportation (Non-Medical): No  Physical Activity:  Not on file  Stress: Not on file  Social Connections: Not on file  Intimate Partner Violence: Not on file    FAMILY HISTORY: Family History  Problem Relation Age of Onset   Breast cancer Mother        62s   Hypertension Father    Uterine cancer Maternal Aunt    Ovarian cancer Maternal Aunt    Breast cancer Maternal Aunt        20s   Breast cancer Cousin 24   Uterine cancer Cousin 36   Colon polyps Neg Hx    Colon cancer Neg Hx    Esophageal cancer Neg  Hx    Stomach cancer Neg Hx     Review of Systems  Constitutional:  Negative for appetite change, chills, fatigue, fever and unexpected weight change.  HENT:   Negative for hearing loss, lump/mass and trouble swallowing.   Eyes:  Negative for eye problems and icterus.  Respiratory:  Negative for chest tightness, cough and shortness of breath.   Cardiovascular:  Negative for chest pain, leg swelling and palpitations.  Gastrointestinal:  Negative for abdominal distention, abdominal pain, constipation, diarrhea, nausea and vomiting.  Endocrine: Negative for hot flashes.  Genitourinary:  Negative for difficulty urinating.   Musculoskeletal:  Positive for arthralgias.  Skin:  Negative for itching and rash.  Neurological:  Negative for dizziness, extremity weakness, headaches and numbness.  Hematological:  Negative for adenopathy. Does not bruise/bleed easily.  Psychiatric/Behavioral:  Negative for depression. The patient is not nervous/anxious.       PHYSICAL EXAMINATION Patient sounds well she is in no apparent distress, mood and behavior are normal.  Breathing is nonlabored.  LABORATORY DATA:  None for this visit    ASSESSMENT and THERAPY PLAN:   Malignant neoplasm of upper-outer quadrant of right breast in female, estrogen receptor positive ([place]) Delanie is a 51 year old woman with bilateral estrogen positive breast cancer here today for follow-up and evaluation.  She has experienced difficulty with tolerating  letrozole.  We discussed that she will stay off of letrozole and we will change her to a different pill called anastrozole.  I reviewed with her that the side effect profile of anastrozole and letrozole is quite similar however patients often will tolerate 1 better than the other.  [place] verbalized understanding with this.  She knows to call me if she cannot tolerate this new medication.  We will see her back in March 2024 or sooner if she continues to struggle with antiestrogen therapy.   Follow up instructions:    -Return to cancer center for follow-up in March 2024    The patient was provided an opportunity to ask questions and all were answered. The patient agreed with the plan and demonstrated an understanding of the instructions.   The patient was advised to call back or seek an in-person evaluation if the symptoms worsen or if the condition fails to improve as anticipated.   I provided 11 minutes of non face-to-face telephone visit time during this encounter, and > 50% was spent counseling as documented under my assessment & plan.  Wilber Bihari, NP 03/22/22 1:40 PM Medical Oncology and Hematology [facility] [place], [place] [address] Tel. [phone]    Fax. [phone]

## 2022-03-23 ENCOUNTER — Other Ambulatory Visit (HOSPITAL_COMMUNITY): Payer: Self-pay

## 2022-03-25 ENCOUNTER — Ambulatory Visit: Payer: Medicaid Other | Attending: Adult Health

## 2022-03-25 DIAGNOSIS — C50411 Malignant neoplasm of upper-outer quadrant of right female breast: Secondary | ICD-10-CM | POA: Diagnosis present

## 2022-03-25 DIAGNOSIS — R209 Unspecified disturbances of skin sensation: Secondary | ICD-10-CM | POA: Diagnosis present

## 2022-03-25 DIAGNOSIS — L599 Disorder of the skin and subcutaneous tissue related to radiation, unspecified: Secondary | ICD-10-CM | POA: Insufficient documentation

## 2022-03-25 DIAGNOSIS — Z170 Estrogen receptor positive status [ER+]: Secondary | ICD-10-CM | POA: Insufficient documentation

## 2022-03-25 DIAGNOSIS — I890 Lymphedema, not elsewhere classified: Secondary | ICD-10-CM | POA: Diagnosis present

## 2022-03-25 DIAGNOSIS — R293 Abnormal posture: Secondary | ICD-10-CM | POA: Insufficient documentation

## 2022-03-25 DIAGNOSIS — C50412 Malignant neoplasm of upper-outer quadrant of left female breast: Secondary | ICD-10-CM | POA: Insufficient documentation

## 2022-03-25 NOTE — Therapy (Signed)
OUTPATIENT PHYSICAL THERAPY ONCOLOGY TREATMENT  Patient Name: Chelsea Hansen MRN: [id number] DOB:02-Oct-1970, 51 y.o., female Today's Date: 03/25/2022   PT End of Session - 03/25/22 0757     Visit Number 3    Number of Visits 12    Date for PT Re-Evaluation 04/15/22    Authorization Type Medicaid    Authorization - Visit Number 2    Authorization - Number of Visits 3    Progress Note Due on Visit 3    PT Start Time 0800    PT Stop Time 6237    PT Time Calculation (min) 54 min    Activity Tolerance Patient tolerated treatment well    Behavior During Therapy [facility] for tasks assessed/performed             Past Medical History:  Diagnosis Date   Allergy    seasonal allergies   Anxiety    on meds   Asthma    uses inhaler   Breast cancer ([place]) 2012   RIGHT lumpectomy   Cancer ([place]) 2022   RIGHT breast lump-dx 2022   Depression    on meds   DVT (deep venous thrombosis) ([place]) 2010   after hysterectomy   Family history of uterine cancer    GERD (gastroesophageal reflux disease)    with certain foods/OTC PRN meds   Headache(784.0)    Hyperlipidemia    on meds   Hypertension    on meds   SVT (supraventricular tachycardia)    Past Surgical History:  Procedure Laterality Date   ABDOMINAL HYSTERECTOMY     AXILLARY SENTINEL NODE BIOPSY Left 07/09/2021   Procedure: LEFT AXILLARY SENTINEL NODE BIOPSY;  Surgeon: [name], MD;  Location: [place];  Service: General;  Laterality: Left;   BREAST EXCISIONAL BIOPSY Right 09/16/2009   BREAST LUMPECTOMY WITH RADIOACTIVE SEED LOCALIZATION Bilateral 07/09/2021   Procedure: BILATERAL BREAST LUMPECTOMY WITH RADIOACTIVE SEED LOCALIZATION;  Surgeon: [name], MD;  Location: [place];  Service: General;  Laterality: Bilateral;   BREAST SURGERY     lumpectomy   PORT-A-CATH REMOVAL Left 07/09/2021   Procedure: REMOVAL PORT-A-CATH;  Surgeon: [name], MD;  Location: [place];  Service: General;  Laterality: Left;   PORTACATH PLACEMENT Left 01/26/2021   Procedure: INSERTION PORT-A-CATH;  Surgeon: [name], MD;  Location[facility];  Service: General;  Laterality: Left;   RADIOACTIVE SEED GUIDED AXILLARY SENTINEL LYMPH NODE Right 07/09/2021   Procedure: RADIOACTIVE SEED GUIDED RIGHT AXILLARY SENTINEL LYMPH NODE DISSECTION;  Surgeon: [name], MD;  Location: [place];  Service: General;  Laterality: Right;   TUBAL LIGATION     Patient Active Problem List   Diagnosis Date Noted   PSVT (paroxysmal supraventricular tachycardia) 07/08/2021   Pain management contract signed 05/04/2021   Chronic pain syndrome (breast cancer) 05/04/2021   Palpitations 03/25/2021   Sinus bradycardia 03/25/2021   Daytime somnolence 03/25/2021   Snoring 03/25/2021   Obesity (BMI 30-39.9) 03/25/2021   Port-A-Cath in place 01/27/2021   Genetic testing 12/24/2020   Family history of uterine cancer 12/17/2020   Malignant neoplasm of upper-outer quadrant of right breast in female, estrogen receptor positive ([place]) 12/12/2020   Malignant neoplasm of upper-outer quadrant of left breast in female, estrogen receptor positive ([place]) 12/12/2020   Hypokalemia 01/02/2012   Asthma 01/01/2012   Bradycardia 01/01/2012   Syncope 12/31/2011   Mediastinal adenopathy 12/31/2011   Anemia 12/31/2011   Chest pain 12/31/2011  PCP: Juluis Mire,  REFERRING PROVIDER: Wilber Bihari, NP  REFERRING DIAG: Bilateral Breast swelling, decreased shoulder ROM bilaterally, Bilateral LE neuropathy  THERAPY DIAG:  Malignant neoplasm of upper-outer quadrant of right breast in female, estrogen receptor positive ([place])  Malignant neoplasm of upper-outer quadrant of left breast in female, estrogen receptor positive ([place])  Abnormal posture  Disturbance of skin sensation  Disorder of the skin and subcutaneous tissue related to radiation, unspecified  Lymphedema,  not elsewhere classified  ONSET DATE: 01/2021  Rationale for Evaluation and Treatment Rehabilitation  SUBJECTIVE                                                                                                                                                                                           SUBJECTIVE STATEMENT:  Shoulders and chest are improving and pain is easing off some. I can reach into cabinets a bit better,wash clothes now,light lifting better. I can dress a little easier. I have not heard from ibuprofen have not heard from [facility] yet. I am sleeping some better. The medial breast still swells with the compression bra. They switched my medicine to Anastrozole but I have only been on it 2 days  HISTORY Neoadjuvant chemotherapy with Doxorubicin and Cyclophosphamide given x 4 beginning 01/27/2021 and completing on 03/10/2021 followed by weekly paclitaxel x 12 beginning 05/2021 07/09/2021: Right lumpectomy: Grade 2 invasive lobular cancer, 2.5 cm, LCIS, margins negative, LCIS focally at anterior margin, 1/1 lymph node positive, ER 95%, PR 100%, HER2 negative, Ki-67 40% Left lumpectomy: High-grade DCIS: 0.7 cm, margins negative, 0/1 lymph node negative ER 95%, PR 100%  PAIN:  Are you having pain? Yes NPRS scale: 5/10  Pain location: breast/ axillary region Pain orientation: Bilateral  PAIN TYPE: aching, burning, sharp, and tight Pain description: constant  Aggravating factors: household chores,lifting,sleeping,  Relieving factors: pain meds  PRECAUTIONS: Bilateral Lymphedema risk, neuropathy  WEIGHT BEARING RESTRICTIONS No  FALLS:  Has patient fallen in last 6 months? 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  HAND DOMINANCE : right   PRIOR LEVEL OF FUNCTION: Independent  PATIENT GOALS stop the pain,be able to reach without pain, be able to clean   OBJECTIVE  COGNITION:  Overall cognitive status: Within functional limits for tasks assessed   PALPATION: Very tender throughout bilateral UQ  OBSERVATIONS / OTHER ASSESSMENTS: Both breast with generalized swelling  and enlarged pores  SENSATION:  Light touch: Deficits at lateral breast, medial arm inact     POSTURE: Forward head, rounded shoulders  UPPER EXTREMITY AROM/PROM:  A/PROM RIGHT   eval  Right 03/25/2022  Shoulder extension 42   Shoulder flexion 110 pulls under arm 148  Shoulder abduction 103 128  Shoulder internal rotation 55   Shoulder external rotation 79     (Blank rows = not tested)  A/PROM LEFT   eval Left 03/25/2022  Shoulder extension 52   Shoulder flexion 120 147  Shoulder abduction 115 154  Shoulder internal rotation 55   Shoulder external rotation 80     (Blank rows = not tested)   CERVICAL AROM: All within functional limits:     UPPER EXTREMITY STRENGTH: NT due to pain  LOWER EXTREMITY MMT: 4+/5  LYMPHEDEMA ASSESSMENTS:   SURGERY TYPE/DATE: 07/09/2021 BILATERAL BREAST LUMPECTOMY WITH RADIOACTIVE SEED LOCALIZATION RADIOACTIVE SEED GUIDED RIGHT AXILLARY TARGETED LYMPH NODE DISSECTION LEFT AXILLARY SENTINEL NODE BIOPSY LEFT AXILLARY LYMPH NODE MAPPING WITH SENTIMAG  NUMBER OF LYMPH NODES REMOVED: Right 1/1, Left 0/1  CHEMOTHERAPY: neoadjuvant  RADIATION:yes bilaterally  HORMONE TREATMENT: yes  INFECTIONS: no  LYMPHEDEMA ASSESSMENTS:   LANDMARK RIGHT  eval  10 cm proximal to olecranon process 30.5  Olecranon process 27.1  10 cm proximal to ulnar styloid process 21.2  Just proximal to ulnar styloid process 16.6  Across hand at thumb web space 21.4  At base of 2nd digit 6.8  (Blank rows = not tested)  LANDMARK LEFT  eval  10 cm proximal to olecranon process 30.7   Olecranon process 26.7  10 cm proximal to ulnar styloid process 20.5  Just proximal to ulnar styloid process 16.3  Across hand at thumb web space 21.1  At base of 2nd digit 6.7  (Blank rows = not tested)   FUNCTIONAL TESTS:   SLS ; 2-3 seconds ea leg GAIT: Distance walked: 20 Assistive device utilized: None Level of assistance: Complete Independence     QUICK DASH SURVEY: 77% BREAST COMPLAINTS SURVEY  :75  TODAY'S TREATMENT  03/24/2022  Pulleys x 1:30 flexion, scaption, abd with VC's for proper technique, Ball rolls up wall x 10 Soft tissue mobilization to bilateral pectorals, UT, and lateral trunk with cocoa butter in supine PROM bilateral shoulders with VC's to relax arm Measured AROM   03/18/2022 Assessed bilateral breasts; medial breasts still with very enlarged pores. Instructed pt in 4 post op exercises to improve ROM with supine clasped hands flexion and star gazer x 5 ea., supine wand flexion and scaption x 5, standing wall slides and scapular retraction x 5 ea, Lat counter stretch x 5 Soft tissue mobilization to bilateral pectorals, UT, and lateral trunk with cocoa butter in supine    03/04/2022 Pt given script for compression bra and information to Purchase at second to [place]. Discussed POC and what she can expect with therapy.  PATIENT EDUCATION:  Education details: POC, Flexi touch, compression bra and gave script. (Pt in agreement with sending demo to [facility]) Person educated: Patient Education method: Explanation Education comprehension: verbalized understanding   HOME EXERCISE PROGRAM: None given today due to time  ASSESSMENT:  CLINICAL IMPRESSION: Pt did very well with pulleys today and AROM when measured after manual therapy and PROM was improved bilaterally left greater than right. Pt still guards Right side and has more tenderness/tightness in pectorals and lateral trunk. Pt is compliant with HEP  OBJECTIVE IMPAIRMENTS decreased  activity tolerance, decreased balance, decreased  knowledge of condition, difficulty walking, decreased ROM, increased edema, increased fascial restrictions, impaired sensation, impaired UE functional use, postural dysfunction, and pain.   ACTIVITY LIMITATIONS carrying, lifting, sleeping, bathing, dressing, and reach over head  PARTICIPATION LIMITATIONS: cleaning, occupation, and yard work  PERSONAL FACTORS 3+ comorbidities: Bilateral breast Cancer with chemo/radiation, pain management,  are also affecting patient's functional outcome.   REHAB POTENTIAL: Good  CLINICAL DECISION MAKING: Stable/uncomplicated  EVALUATION COMPLEXITY: Low  GOALS: Goals reviewed with patient? Yes  SHORT TERM GOALS: Target date: 04/15/2022    Pt will be independent and compliant with a HEP for shoulder ROM and strength Baseline: Goal status: INITIAL  2.  Pt will have improved bilateral shoulder flexion and abd to atleast 135 Baseline:  Goal status: INITIAL 3. Pt will have Flexi touch trial for breast lymphedema  LONG TERM GOALS: Target date: 05/06/2022    Pt will have bilateral shoulder flexion atleast 145 degrees and abduction atleast 160 degrees for improved reaching ability Baseline:  Goal status: INITIAL  2.  Pts quick dash will be improved to no greater than 25% for improved function Baseline:  Goal status: INITIAL  3.  Pt will be independent in self MLD to decrease bilateral breast swelling Baseline:  Goal status: INITIAL  4.  Pt will purchase a compression bra to decrease breast swelling Baseline:  Goal status: INITIAL  5.  Pt will be able to dress, bathe and perform reaching activities with minimal pain. Baseline:  Goal status: INITIAL     PLAN: PT FREQUENCY: 2x/week  PT DURATION: 12 weeks  PLANNED INTERVENTIONS: Therapeutic exercises, Therapeutic activity, Neuromuscular re-education, Balance training, Patient/Family education, Self Care, Joint mobilization, Orthotic/Fit  training, Manual lymph drainage, scar mobilization, Vasopneumatic device, Manual therapy, and Re-evaluation  PLAN FOR NEXT SESSION:Recert, pt to wear compression bra; consider peach foam medially in bra, shoulder ROM, STM prn to decrease pain and tightness, initiate and instruct Bilateral breast MLD. Demo sent to [employer], PT 03/25/2022, 8:56 AM

## 2022-03-31 ENCOUNTER — Ambulatory Visit: Payer: Medicaid Other

## 2022-03-31 DIAGNOSIS — R209 Unspecified disturbances of skin sensation: Secondary | ICD-10-CM

## 2022-03-31 DIAGNOSIS — Z170 Estrogen receptor positive status [ER+]: Secondary | ICD-10-CM

## 2022-03-31 DIAGNOSIS — C50411 Malignant neoplasm of upper-outer quadrant of right female breast: Secondary | ICD-10-CM | POA: Diagnosis not present

## 2022-03-31 DIAGNOSIS — C50412 Malignant neoplasm of upper-outer quadrant of left female breast: Secondary | ICD-10-CM

## 2022-03-31 DIAGNOSIS — L599 Disorder of the skin and subcutaneous tissue related to radiation, unspecified: Secondary | ICD-10-CM

## 2022-03-31 DIAGNOSIS — I890 Lymphedema, not elsewhere classified: Secondary | ICD-10-CM

## 2022-03-31 DIAGNOSIS — R293 Abnormal posture: Secondary | ICD-10-CM

## 2022-03-31 NOTE — Therapy (Signed)
OUTPATIENT PHYSICAL THERAPY ONCOLOGY TREATMENT  Patient Name: Chelsea Hansen MRN: [id number] DOB:February 16, 1971, 51 y.o., female Today's Date: 03/31/2022   PT End of Session - 03/31/22 1003     Visit Number 4    Number of Visits 16    Date for PT Re-Evaluation 05/12/22    Authorization Type Medicaid    PT Start Time 1004    PT Stop Time 1050    PT Time Calculation (min) 46 min    Activity Tolerance Patient tolerated treatment well    Behavior During Therapy WFL for tasks assessed/performed             Past Medical History:  Diagnosis Date   Allergy    seasonal allergies   Anxiety    on meds   Asthma    uses inhaler   Breast cancer ([place]) 2012   RIGHT lumpectomy   Cancer ([place]) 2022   RIGHT breast lump-dx 2022   Depression    on meds   DVT (deep venous thrombosis) ([place]) 2010   after hysterectomy   Family history of uterine cancer    GERD (gastroesophageal reflux disease)    with certain foods/OTC PRN meds   Headache(784.0)    Hyperlipidemia    on meds   Hypertension    on meds   SVT (supraventricular tachycardia)    Past Surgical History:  Procedure Laterality Date   ABDOMINAL HYSTERECTOMY     AXILLARY SENTINEL NODE BIOPSY Left 07/09/2021   Procedure: LEFT AXILLARY SENTINEL NODE BIOPSY;  Surgeon: [name], MD;  Location: [place];  Service: General;  Laterality: Left;   BREAST EXCISIONAL BIOPSY Right 09/16/2009   BREAST LUMPECTOMY WITH RADIOACTIVE SEED LOCALIZATION Bilateral 07/09/2021   Procedure: BILATERAL BREAST LUMPECTOMY WITH RADIOACTIVE SEED LOCALIZATION;  Surgeon: [name], MD;  Location: [place];  Service: General;  Laterality: Bilateral;   BREAST SURGERY     lumpectomy   PORT-A-CATH REMOVAL Left 07/09/2021   Procedure: REMOVAL PORT-A-CATH;  Surgeon: [name], MD;  Location: [place];  Service: General;  Laterality: Left;   PORTACATH PLACEMENT Left 01/26/2021   Procedure:  INSERTION PORT-A-CATH;  Surgeon: [name], MD;  [facility];  Service: General;  Laterality: Left;   RADIOACTIVE SEED GUIDED AXILLARY SENTINEL LYMPH NODE Right 07/09/2021   Procedure: RADIOACTIVE SEED GUIDED RIGHT AXILLARY SENTINEL LYMPH NODE DISSECTION;  Surgeon: [name], MD;  Location: [place];  Service: General;  Laterality: Right;   TUBAL LIGATION     Patient Active Problem List   Diagnosis Date Noted   PSVT (paroxysmal supraventricular tachycardia) 07/08/2021   Pain management contract signed 05/04/2021   Chronic pain syndrome (breast cancer) 05/04/2021   Palpitations 03/25/2021   Sinus bradycardia 03/25/2021   Daytime somnolence 03/25/2021   Snoring 03/25/2021   Obesity (BMI 30-39.9) 03/25/2021   Port-A-Cath in place 01/27/2021   Genetic testing 12/24/2020   Family history of uterine cancer 12/17/2020   Malignant neoplasm of upper-outer quadrant of right breast in female, estrogen receptor positive ([place]) 12/12/2020   Malignant neoplasm of upper-outer quadrant of left breast in female, estrogen receptor positive ([place]) 12/12/2020   Hypokalemia 01/02/2012   Asthma 01/01/2012   Bradycardia 01/01/2012   Syncope 12/31/2011   Mediastinal adenopathy 12/31/2011   Anemia 12/31/2011   Chest pain 12/31/2011    PCP: Juluis Mire,  REFERRING PROVIDER: Wilber Bihari, NP  REFERRING DIAG: Bilateral Breast swelling, decreased shoulder ROM bilaterally, Bilateral LE neuropathy  THERAPY DIAG:  Malignant neoplasm of upper-outer quadrant of right breast in female, estrogen receptor positive ([place])  Malignant neoplasm of upper-outer quadrant of left breast in female, estrogen receptor positive ([facility])  Abnormal posture  Disturbance of skin sensation  Disorder of the skin and subcutaneous tissue related to radiation, unspecified  Lymphedema, not elsewhere classified  ONSET DATE: 01/2021  Rationale for Evaluation and Treatment  Rehabilitation  SUBJECTIVE                                                                                                                                                                                           SUBJECTIVE STATEMENT: ROM is still going better;  Can reach into cabinets better, and can reach higher. I can reach to shower without pain. I can lift small boxes at work. I think pain is about 50% better overall.  I am keeping up with my exercises.  I have trouble brushing my hair and reaching both arms behind my head.  I am still getting more swelling medially in both breasts but worse on the right.  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 Yes NPRS scale: 5/10  Pain location: breast/ axillary region Pain orientation: Bilateral  PAIN TYPE: aching, burning, sharp, and tight Pain description: constant  Aggravating factors: household chores,lifting,sleeping,  Relieving factors: pain meds  PRECAUTIONS: Bilateral Lymphedema risk, neuropathy  WEIGHT BEARING RESTRICTIONS No  FALLS:  Has patient fallen in last 6 months? 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  HAND DOMINANCE : right   PRIOR LEVEL OF FUNCTION: Independent  PATIENT GOALS stop the  pain,be able to reach without pain, be able to clean   OBJECTIVE  COGNITION:  Overall cognitive status: Within functional limits for tasks assessed   PALPATION: Very tender throughout bilateral UQ  OBSERVATIONS / OTHER ASSESSMENTS: Both breast with generalized swelling and enlarged pores  SENSATION:  Light touch: Deficits at lateral breast, medial arm inact     POSTURE: Forward head, rounded  shoulders  UPPER EXTREMITY AROM/PROM:  A/PROM RIGHT   eval  Right 03/25/2022 Right 03/31/2022  Shoulder extension 42  48  Shoulder flexion 110 pulls under arm 148 150  Shoulder abduction 103 128 155  Shoulder internal rotation 55  60  Shoulder external rotation 79  93    (Blank rows = not tested)  A/PROM LEFT   eval Left 03/25/2022 Left 03/31/2022  Shoulder extension 52  52  Shoulder flexion 120 147 159  Shoulder abduction 115 154 168  Shoulder internal rotation 55  60  Shoulder external rotation 80  91    (Blank rows = not tested)   CERVICAL AROM: All within functional limits:     UPPER EXTREMITY STRENGTH: NT due to pain  LOWER EXTREMITY MMT: 4+/5  LYMPHEDEMA ASSESSMENTS:   SURGERY TYPE/DATE: 07/09/2021 BILATERAL BREAST LUMPECTOMY WITH RADIOACTIVE SEED LOCALIZATION RADIOACTIVE SEED GUIDED RIGHT AXILLARY TARGETED LYMPH NODE DISSECTION LEFT AXILLARY SENTINEL NODE BIOPSY LEFT AXILLARY LYMPH NODE MAPPING WITH SENTIMAG  NUMBER OF LYMPH NODES REMOVED: Right 1/1, Left 0/1  CHEMOTHERAPY: neoadjuvant  RADIATION:yes bilaterally  HORMONE TREATMENT: yes  INFECTIONS: no  LYMPHEDEMA ASSESSMENTS:   LANDMARK RIGHT  eval  10 cm proximal to olecranon process 30.5  Olecranon process 27.1  10 cm proximal to ulnar styloid process 21.2  Just proximal to ulnar styloid process 16.6  Across hand at thumb web space 21.4  At base of 2nd digit 6.8  (Blank rows = not tested)  LANDMARK LEFT  eval  10 cm proximal to olecranon process 30.7  Olecranon process 26.7  10 cm  proximal to ulnar styloid process 20.5  Just proximal to ulnar styloid process 16.3  Across hand at thumb web space 21.1  At base of 2nd digit 6.7  (Blank rows = not tested)   FUNCTIONAL TESTS:   SLS ; 2-3 seconds ea leg GAIT: Distance walked: 20 Assistive device utilized: None Level of assistance: Complete Independence     QUICK DASH SURVEY: 77%,  03/31/2022: 31.82 BREAST COMPLAINTS SURVEY  :75  TODAY'S TREATMENT   03/31/2022 Pulleys x 1:30 flex, scaption, abduction Supine wand flexion and scaption x 5 ea, star gazer x 5 Measured bilateral shoulder ROM and checked goals Made spaghetti foam pads for medial side of bilateral breast where enlarged pores and fibrosis are present.  03/24/2022  Pulleys x 1:30 flexion, scaption, abd with VC's for proper technique, Ball rolls up wall x 10 Soft tissue mobilization to bilateral pectorals, UT, and lateral trunk with cocoa butter in supine PROM bilateral shoulders with VC's to relax arm Measured AROM   03/18/2022 Assessed bilateral breasts; medial breasts still with very enlarged pores. Instructed pt in 4 post op exercises to improve ROM with supine clasped hands flexion and star gazer x 5 ea., supine wand flexion and scaption x 5, standing wall slides and scapular retraction x 5 ea, Lat counter stretch x 5 Soft tissue mobilization to bilateral pectorals, UT, and lateral trunk with cocoa butter in supine    03/04/2022 Pt given script for compression bra and information to Purchase at [facility] to [place]. Discussed POC and what she can expect with therapy.  PATIENT EDUCATION:  Education details: POC, Flexi touch, compression bra and gave script. (Pt in agreement with sending demo to [facility]) Person educated: Patient Education method: Explanation Education comprehension: verbalized understanding   HOME EXERCISE PROGRAM: Eval :None given today due to time  ASSESSMENT:  CLINICAL IMPRESSION: Pt is making good progress  towards goals. Pain is improved 50%  overall and quick dash is improved 46% overall. She is making excellent progress with ROM goals and has met ROM goals on the left, and nearly on the right. She does continue with some limitations with strength particularly for maintaining overhead activities and she has considerable bilateral breast swelling with enlarged pores greatest on the right. She has been very compliant with treatment.   OBJECTIVE IMPAIRMENTS decreased activity tolerance, decreased balance, decreased knowledge of condition, difficulty walking, decreased ROM, increased edema, increased fascial restrictions, impaired sensation, impaired UE functional use, postural dysfunction, and pain.   ACTIVITY LIMITATIONS carrying, lifting, sleeping, bathing, dressing, and reach over head  PARTICIPATION LIMITATIONS: cleaning, occupation, and yard work  PERSONAL FACTORS 3+ comorbidities: Bilateral breast Cancer with chemo/radiation, pain management,  are also affecting patient's functional outcome.   REHAB POTENTIAL: Good  CLINICAL DECISION MAKING: Stable/uncomplicated  EVALUATION COMPLEXITY: Low  GOALS: Goals reviewed with patient? Yes  SHORT TERM GOALS: Target date: 04/21/2022    Pt will be independent and compliant with a HEP for shoulder ROM and strength Baseline: Goal status: MET 2.  Pt will have improved bilateral shoulder flexion and abd to atleast 135 Baseline:  Goal status: MET 03/31/2022  3. Pt will have Flexi touch trial for breast lymphedema In Progress:Having TODAY  LONG TERM GOALS: Target date: 05/12/2022    Pt will have bilateral shoulder flexion atleast 145 degrees and abduction atleast 160 degrees for improved reaching ability Baseline:  Goal status: MET left 03/31/2022, progressing Right  2.  Pts quick dash will be improved to no greater than 25% for improved function Baseline: 33% today Goal status: in PROGRESS 3.  Pt will be independent in self MLD to decrease  bilateral breast swelling Baseline:  Goal status: In Progress  4.  Pt will purchase a compression bra to decrease breast swelling Baseline:  Goal status: MET 5.  Pt will be able to dress, bathe and perform reaching activities with minimal pain. Baseline:  Goal status: In Progress     PLAN: PT FREQUENCY: 2x/week  PT DURATION:  6 weeks  PLANNED INTERVENTIONS: Therapeutic exercises, Therapeutic activity, Neuromuscular re-education, Balance training, Patient/Family education, Self Care, Joint mobilization, Orthotic/Fit training, Manual lymph drainage, scar mobilization, Vasopneumatic device, Manual therapy, and Re-evaluation  PLAN FOR NEXT SESSION:Recert, pt to wear compression bra; how did spaghetti  foam medially in bra do? , initiate and instruct Bilateral breast MLD.How was [employer]?, ROM and STM prn    Claris Pong, PT 03/31/2022, 12:04 PM

## 2022-04-12 ENCOUNTER — Telehealth: Payer: Self-pay

## 2022-04-12 NOTE — Telephone Encounter (Signed)
Pt called and states she has noticed swelling off and on to her right side, lateral to her right breast, below her axilla. She states it swells up like an orange, then swelling decreases; painful to touch; denies erythema or drainage. Pt states the area swells to the size of an orange then decreases. Pt was offered appt with Wilber Bihari, NP for 04/14/22 at [place]. Pt accepted and knows to arrive for check in at [facility] 04/14/22.

## 2022-04-13 ENCOUNTER — Ambulatory Visit: Payer: Medicaid Other

## 2022-04-13 DIAGNOSIS — R209 Unspecified disturbances of skin sensation: Secondary | ICD-10-CM

## 2022-04-13 DIAGNOSIS — C50411 Malignant neoplasm of upper-outer quadrant of right female breast: Secondary | ICD-10-CM

## 2022-04-13 DIAGNOSIS — I890 Lymphedema, not elsewhere classified: Secondary | ICD-10-CM

## 2022-04-13 DIAGNOSIS — R293 Abnormal posture: Secondary | ICD-10-CM

## 2022-04-13 DIAGNOSIS — C50412 Malignant neoplasm of upper-outer quadrant of left female breast: Secondary | ICD-10-CM

## 2022-04-13 DIAGNOSIS — L599 Disorder of the skin and subcutaneous tissue related to radiation, unspecified: Secondary | ICD-10-CM

## 2022-04-13 NOTE — Therapy (Signed)
OUTPATIENT PHYSICAL THERAPY ONCOLOGY TREATMENT  Patient Name: Chelsea Hansen MRN: [id number] DOB:[birth year], 51 y.o., female Today's Date: 04/13/2022   PT End of Session - 04/13/22 1402     Visit Number 5    Number of Visits 16    Date for PT Re-Evaluation 05/12/22    Authorization Type Medicaid    PT Start Time 1405    PT Stop Time 1455    PT Time Calculation (min) 50 min    Activity Tolerance Patient tolerated treatment well    Behavior During Therapy [facility] for tasks assessed/performed             Past Medical History:  Diagnosis Date   Allergy    seasonal allergies   Anxiety    on meds   Asthma    uses inhaler   Breast cancer ([place]) 2012   RIGHT lumpectomy   Cancer ([place]) 2022   RIGHT breast lump-dx 2022   Depression    on meds   DVT (deep venous thrombosis) ([place]) 2010   after hysterectomy   Family history of uterine cancer    GERD (gastroesophageal reflux disease)    with certain foods/OTC PRN meds   Headache(784.0)    Hyperlipidemia    on meds   Hypertension    on meds   SVT (supraventricular tachycardia)    Past Surgical History:  Procedure Laterality Date   ABDOMINAL HYSTERECTOMY     AXILLARY SENTINEL NODE BIOPSY Left 07/09/2021   Procedure: LEFT AXILLARY SENTINEL NODE BIOPSY;  Surgeon: [name], MD;  Location: [place];  Service: General;  Laterality: Left;   BREAST EXCISIONAL BIOPSY Right 09/16/2009   BREAST LUMPECTOMY WITH RADIOACTIVE SEED LOCALIZATION Bilateral 07/09/2021   Procedure: BILATERAL BREAST LUMPECTOMY WITH RADIOACTIVE SEED LOCALIZATION;  Surgeon: [name], MD;  Location: [place];  Service: General;  Laterality: Bilateral;   BREAST SURGERY     lumpectomy   PORT-A-CATH REMOVAL Left 07/09/2021   Procedure: REMOVAL PORT-A-CATH;  Surgeon: [name], MD;  Location: [place];  Service: General;  Laterality: Left;   PORTACATH PLACEMENT Left 01/26/2021   Procedure:  INSERTION PORT-A-CATH;  Surgeon: [name], MD;  Location: [facility];  Service: General;  Laterality: Left;   RADIOACTIVE SEED GUIDED AXILLARY SENTINEL LYMPH NODE Right 07/09/2021   Procedure: RADIOACTIVE SEED GUIDED RIGHT AXILLARY SENTINEL LYMPH NODE DISSECTION;  Surgeon: [name], MD;  Location: [place];  Service: General;  Laterality: Right;   TUBAL LIGATION     Patient Active Problem List   Diagnosis Date Noted   PSVT (paroxysmal supraventricular tachycardia) 07/08/2021   Pain management contract signed 05/04/2021   Chronic pain syndrome (breast cancer) 05/04/2021   Palpitations 03/25/2021   Sinus bradycardia 03/25/2021   Daytime somnolence 03/25/2021   Snoring 03/25/2021   Obesity (BMI 30-39.9) 03/25/2021   Port-A-Cath in place 01/27/2021   Genetic testing 12/24/2020   Family history of uterine cancer 12/17/2020   Malignant neoplasm of upper-outer quadrant of right breast in female, estrogen receptor positive ([place]) 12/12/2020   Malignant neoplasm of upper-outer quadrant of left breast in female, estrogen receptor positive ([place]) 12/12/2020   Hypokalemia 01/02/2012   Asthma 01/01/2012   Bradycardia 01/01/2012   Syncope 12/31/2011   Mediastinal adenopathy 12/31/2011   Anemia 12/31/2011   Chest pain 12/31/2011    PCP: Juluis Mire,  REFERRING PROVIDER: Wilber Bihari, NP  REFERRING DIAG: Bilateral Breast swelling, decreased shoulder ROM bilaterally, Bilateral LE neuropathy  THERAPY DIAG:  Malignant neoplasm of upper-outer quadrant of right breast in female, estrogen receptor positive ([place])  Malignant neoplasm of upper-outer quadrant of left breast in female, estrogen receptor positive ([place])  Abnormal posture  Disturbance of skin sensation  Disorder of the skin and subcutaneous tissue related to radiation, unspecified  Lymphedema, not elsewhere classified  ONSET DATE: 01/2021  Rationale for Evaluation and Treatment  Rehabilitation  SUBJECTIVE                                                                                                                                                                                           SUBJECTIVE STATEMENT: Megan did my flexi touch trial last week.  I noticed increased swelling last Friday in the right axillary region. I am getting more pain there. There was some redness yesterday, but not today.  I see [name] in the am tomorrow. 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 Yes NPRS scale: 5/10  Pain location:  Right breast/ axillary region Pain orientation: Right  PAIN TYPE: aching, burning, sharp, and tight Pain description: constant  Aggravating factors: household chores,lifting,sleeping,  Relieving factors: pain meds  PRECAUTIONS: Bilateral Lymphedema risk, neuropathy  WEIGHT BEARING RESTRICTIONS No  FALLS:  Has patient fallen in last 6 months? 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  HAND DOMINANCE : right   PRIOR LEVEL OF FUNCTION: Independent  PATIENT GOALS stop the pain,be able to reach without pain, be able to  clean   OBJECTIVE  COGNITION:  Overall cognitive status: Within functional limits for tasks assessed   PALPATION: Very tender throughout bilateral UQ  OBSERVATIONS / OTHER ASSESSMENTS: Both breast with generalized swelling and enlarged pores  SENSATION:  Light touch: Deficits at lateral breast, medial arm inact     POSTURE: Forward head, rounded shoulders  UPPER EXTREMITY AROM/PROM:  A/PROM RIGHT   eval  Right 03/25/2022 Right 03/31/2022  Shoulder extension 42  48  Shoulder flexion 110 pulls under arm 148 150  Shoulder abduction 103 128 155  Shoulder internal rotation 55  60  Shoulder external rotation 79  93    (Blank rows = not tested)  A/PROM LEFT   eval Left 03/25/2022 Left 03/31/2022  Shoulder extension 52  52  Shoulder flexion 120 147 159  Shoulder abduction 115 154 168  Shoulder internal rotation 55  60  Shoulder external rotation 80  91    (Blank rows = not tested)   CERVICAL AROM: All within functional limits:     UPPER EXTREMITY STRENGTH: NT due to pain  LOWER EXTREMITY MMT: 4+/5  LYMPHEDEMA ASSESSMENTS:   SURGERY TYPE/DATE: 07/09/2021 BILATERAL BREAST LUMPECTOMY WITH RADIOACTIVE SEED LOCALIZATION RADIOACTIVE SEED GUIDED RIGHT AXILLARY TARGETED LYMPH NODE DISSECTION LEFT AXILLARY SENTINEL NODE BIOPSY LEFT AXILLARY LYMPH NODE MAPPING WITH SENTIMAG  NUMBER OF LYMPH NODES REMOVED: Right 1/1, Left 0/1  CHEMOTHERAPY: neoadjuvant  RADIATION:yes bilaterally  HORMONE TREATMENT: yes  INFECTIONS: no  LYMPHEDEMA ASSESSMENTS:   LANDMARK RIGHT  eval  10 cm proximal to olecranon process 30.5  Olecranon process 27.1  10 cm proximal to ulnar styloid process 21.2  Just proximal to ulnar styloid process 16.6  Across hand at thumb web space 21.4  At base of 2nd digit 6.8  (Blank rows = not tested)  LANDMARK LEFT  eval  10 cm proximal to olecranon process 30.7  Olecranon process 26.7  10 cm proximal to ulnar styloid process 20.5  Just proximal  to ulnar styloid process 16.3  Across hand at thumb web space 21.1  At base of 2nd digit 6.7  (Blank rows = not tested)   FUNCTIONAL TESTS:   SLS ; 2-3 seconds ea leg GAIT: Distance walked: 20 Assistive device utilized: None Level of assistance: Complete Independence     QUICK DASH SURVEY: 77%,  03/31/2022: 31.82 BREAST COMPLAINTS SURVEY  :75  TODAY'S TREATMENT   04/12/2022 Pt observed with increased swelling right axillary region with continued swelling at bilateral breast. Adjusted pts bra up to the last notch to try and put compression more under the arm. Therapist performed short neck, 5 diaphragmatic breaths, activated right axillary and inguinal LN's, right axillo-inguinal pathway and right breast lateral to pathway, then medial right breast to lateral and pathway. Pt was instructed in short neck, breaths, LN activation and pathway and was given written handout. Pt was found to have several painful cords in the right axillary region. MFR was performed to cords with arm in scaption. PROM with MFR was performed for right shoulder flexion and abd.  03/31/2022 Pulleys x 1:30 flex, scaption, abduction Supine wand flexion and scaption x 5 ea, star gazer x 5 Measured bilateral shoulder ROM and checked goals Made spaghetti foam pads for medial side of bilateral breast where enlarged pores and fibrosis are present.  03/24/2022  Pulleys x 1:30 flexion, scaption, abd with VC's for proper technique, Ball rolls up wall x 10 Soft tissue mobilization to bilateral pectorals, UT, and lateral trunk with cocoa butter in supine PROM bilateral shoulders with VC's to relax arm Measured AROM   03/18/2022 Assessed bilateral breasts; medial breasts still with very enlarged pores. Instructed pt in 4 post op exercises to improve ROM with supine clasped hands flexion and star gazer x 5 ea., supine wand flexion and scaption x 5, standing wall slides and scapular retraction x 5 ea, Lat counter  stretch x 5 Soft tissue mobilization  to bilateral pectorals, UT, and lateral trunk with cocoa butter in supine    03/04/2022 Pt given script for compression bra and information to Purchase at second to [place]. Discussed POC and what she can expect with therapy.  PATIENT EDUCATION:  Education details: POC, Flexi touch, compression bra and gave script. (Pt in agreement with sending demo to [facility]) Person educated: Patient Education method: Explanation Education comprehension: verbalized understanding   HOME EXERCISE PROGRAM: Eval :None given today due to time  ASSESSMENT:  CLINICAL IMPRESSION: Pt came in today with increased complaints of right axillary swelling and medial upper arm/axillary pain. Increased swelling was visualized and pt was also noted to have several painful cords in the right axillary region and upper arm.  Her swelling was significantly improved after treatment and her right arm felt much better. She will see Dr. Barry Dienes tomorrow. She liked the [employer] and felt it did well. Spaghetti foam also helping with fibrosis in breast. OBJECTIVE IMPAIRMENTS decreased activity tolerance, decreased balance, decreased knowledge of condition, difficulty walking, decreased ROM, increased edema, increased fascial restrictions, impaired sensation, impaired UE functional use, postural dysfunction, and pain.   ACTIVITY LIMITATIONS carrying, lifting, sleeping, bathing, dressing, and reach over head  PARTICIPATION LIMITATIONS: cleaning, occupation, and yard work  PERSONAL FACTORS 3+ comorbidities: Bilateral breast Cancer with chemo/radiation, pain management,  are also affecting patient's functional outcome.   REHAB POTENTIAL: Good  CLINICAL DECISION MAKING: Stable/uncomplicated  EVALUATION COMPLEXITY: Low  GOALS: Goals reviewed with patient? Yes  SHORT TERM GOALS: Target date: 05/04/2022    Pt will be independent and compliant with a HEP for shoulder ROM and  strength Baseline: Goal status: MET 2.  Pt will have improved bilateral shoulder flexion and abd to atleast 135 Baseline:  Goal status: MET 03/31/2022  3. Pt will have Flexi touch trial for breast lymphedema In Progress:Having TODAY  LONG TERM GOALS: Target date: 05/25/2022    Pt will have bilateral shoulder flexion atleast 145 degrees and abduction atleast 160 degrees for improved reaching ability Baseline:  Goal status: MET left 03/31/2022, progressing Right  2.  Pts quick dash will be improved to no greater than 25% for improved function Baseline: 33% today Goal status: in PROGRESS 3.  Pt will be independent in self MLD to decrease bilateral breast swelling Baseline:  Goal status: In Progress  4.  Pt will purchase a compression bra to decrease breast swelling Baseline:  Goal status: MET 5.  Pt will be able to dress, bathe and perform reaching activities with minimal pain. Baseline:  Goal status: In Progress     PLAN: PT FREQUENCY: 2x/week  PT DURATION:  6 weeks  PLANNED INTERVENTIONS: Therapeutic exercises, Therapeutic activity, Neuromuscular re-education, Balance training, Patient/Family education, Self Care, Joint mobilization, Orthotic/Fit training, Manual lymph drainage, scar mobilization, Vasopneumatic device, Manual therapy, and Re-evaluation  PLAN FOR NEXT SESSION:Recert, pt to wear compression bra; how did spaghetti  foam medially in bra do? , initiate and instruct Bilateral breast MLD.How was [employer]?, ROM and STM prn.    Claris Pong, PT 04/13/2022, 2:57 PM

## 2022-04-13 NOTE — Patient Instructions (Signed)
Axillary web syndrome (also called cording) can happen after having breast cancer surgery when lymph nodes in the armpit are removed. It presents as if you have a thin cord in your arm and can run from the armpit all the way down into the forearm. If you've had a sentinel node biopsy, the risk is 1-20% and if you've had an axillary lymph node dissection (more than 7 nodes removed), the risk is 36-72%. The ranges vary depending on the research study.  It most often happens 3-4 weeks post-op but can happen sooner or later. There are several possibilities for what cording actually is. Although no one knows for sure as of yet, it may be related to lymphatics, veins, or other tissue. Sometimes cording resolves on its own but other times it requires physical therapy with a therapist who specializes in lymphedema and/or cancer rehab. Treatment typically involves stretching, manual techniques, and exercise. Sometimes cords get "released" while stretching or during manual treatment and the patient may experience the sensation of a "pop." This may feel strange but it is not dangerous and is a sign that the cord has released; range of motion may be improved in the process. 

## 2022-04-14 ENCOUNTER — Ambulatory Visit: Payer: Medicaid Other

## 2022-04-14 ENCOUNTER — Telehealth: Payer: Self-pay

## 2022-04-14 ENCOUNTER — Encounter: Payer: Self-pay | Admitting: Adult Health

## 2022-04-14 ENCOUNTER — Inpatient Hospital Stay (HOSPITAL_BASED_OUTPATIENT_CLINIC_OR_DEPARTMENT_OTHER): Payer: Medicaid Other | Admitting: Adult Health

## 2022-04-14 VITALS — BP 149/92 | HR 72 | Temp 97.7°F | Resp 18 | Ht 71.0 in | Wt 220.1 lb

## 2022-04-14 DIAGNOSIS — C50411 Malignant neoplasm of upper-outer quadrant of right female breast: Secondary | ICD-10-CM

## 2022-04-14 DIAGNOSIS — Z170 Estrogen receptor positive status [ER+]: Secondary | ICD-10-CM | POA: Diagnosis not present

## 2022-04-14 DIAGNOSIS — M7989 Other specified soft tissue disorders: Secondary | ICD-10-CM | POA: Diagnosis not present

## 2022-04-14 DIAGNOSIS — C50412 Malignant neoplasm of upper-outer quadrant of left female breast: Secondary | ICD-10-CM | POA: Diagnosis present

## 2022-04-14 DIAGNOSIS — Z79811 Long term (current) use of aromatase inhibitors: Secondary | ICD-10-CM | POA: Diagnosis not present

## 2022-04-14 NOTE — Assessment & Plan Note (Signed)
Chelsea Hansen is a 51 year old woman with history of left breast DCIS, right breast stage IIa ER/PR positive, HER2 negative invasive lobular carcinoma status post neoadjuvant chemotherapy, bilateral lumpectomies, adjuvant radiation and letrozole daily.  Chelsea Hansen is here for urgent evaluation of right axillary swelling.  Although it is somewhat improved it still has been waxing and waning for the past couple weeks.  I placed orders for an ultrasound of the right axilla to further evaluate.  Considering her breast exam is benign and her mammogram was last completed 2 months ago I do not feel strongly about making sure she has a mammogram however we will order it if radiology believes it is necessary.  I asked my nurse to help with scheduling the above.  I will see Chelsea Hansen back in approximately 2 weeks for follow-up on this concern.

## 2022-04-14 NOTE — Progress Notes (Signed)
[facility] Follow up:    Chelsea Perna, NP [address] [address]   DIAGNOSIS:  Cancer Staging  Malignant neoplasm of upper-outer quadrant of left breast in female, estrogen receptor positive ([place]) Staging form: Breast, AJCC 8th Edition - Clinical stage from 12/17/2020: Stage IA (cT1b, cN0, cM0, G2, ER+, PR+, HER2-) - Signed by [name], NP on 01/14/2022 Stage prefix: Initial diagnosis Histologic grading system: 3 grade system Laterality: Left Staged by: Pathologist and managing physician Stage used in treatment planning: Yes National guidelines used in treatment planning: Yes Type of national guideline used in treatment planning: NCCN - Pathologic stage from 07/09/2021: No Stage Recommended (ypTis (DCIS), pN0, cM0) - Signed by [name], NP on 07/22/2021 Stage prefix: Post-therapy  Malignant neoplasm of upper-outer quadrant of right breast in female, estrogen receptor positive ([place]) Staging form: Breast, AJCC 8th Edition - Clinical stage from 12/17/2020: Stage IIA (cT2, cN1, cM0, G2, ER+, PR+, HER2-) - Signed by [name], NP on 01/14/2022 Stage prefix: Initial diagnosis Histologic grading system: 3 grade system Laterality: Right Staged by: Pathologist and managing physician Stage used in treatment planning: Yes National guidelines used in treatment planning: Yes Type of national guideline used in treatment planning: NCCN - Pathologic stage from 07/09/2021: No Stage Recommended (ypT2, pN1a, cM0, G2) - Signed by [name], NP on 07/22/2021 Stage prefix: Post-therapy Histologic grading system: 3 grade system   SUMMARY OF ONCOLOGIC HISTORY: Oncology History  Malignant neoplasm of upper-outer quadrant of right breast in female, estrogen receptor positive ([place])  12/12/2020 Initial Diagnosis   status post bilateral breast biopsies 12/03/2020, showing             (1) on the right, a  clinical T2 N1, stage IIa invasive lobular carcinoma, grade 2, estrogen and progesterone receptor strongly positive, HER2 not amplified, with an MIB-1 of 40%                         (a) the biopsied right axillary lymph node was positive with extracapsular extension                         (b) a second right breast mass also biopsied was a fibroadenoma, concordant  MAMMAPRINT tested on biopsy returned high risk, luminal type B, indicating significant benefit from chemo                         (c) biopsy of an area of non-mass-like enhancement in the upper right breast pending   12/17/2020 Cancer Staging   Staging form: Breast, AJCC 8th Edition - Clinical stage from 12/17/2020: Stage IIA (cT2, cN1, cM0, G2, ER+, PR+, HER2-) - Signed by [name], NP on 01/14/2022 Stage prefix: Initial diagnosis Histologic grading system: 3 grade system Laterality: Right Staged by: Pathologist and managing physician Stage used in treatment planning: Yes National guidelines used in treatment planning: Yes Type of national guideline used in treatment planning: NCCN   12/24/2020 Genetic Testing   Negative genetic testing:  No pathogenic variants detected on the [employer] BRCAplus panel (report date 12/24/2020) or the CancerNext-Expanded + RNAinsight panel (report date 12/31/2020). 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   The BRCAplus panel offered by [employer] and includes sequencing and deletion/duplication analysis for the following 8 genes: ATM, BRCA1, BRCA2, CDH1, CHEK2,  PALB2, PTEN, and TP53. The CancerNext-Expanded + RNAinsight gene panel offered by [employer] and includes sequencing and rearrangement analysis for the following 77 genes: AIP, ALK, APC, ATM, AXIN2, BAP1, BARD1, BLM, BMPR1A, BRCA1, BRCA2, BRIP1, CDC73, CDH1, CDK4, CDKN1B, CDKN2A, CHEK2, CTNNA1, DICER1, FANCC, FH, FLCN, GALNT12, KIF1B, LZTR1, MAX, MEN1, MET, MLH1, MSH2, MSH3,  MSH6, MUTYH, NBN, NF1, NF2, NTHL1, PALB2, PHOX2B, PMS2, POT1, PRKAR1A, PTCH1, PTEN, RAD51C, RAD51D, RB1, RECQL, RET, SDHA, SDHAF2, SDHB, SDHC, SDHD, SMAD4, SMARCA4, SMARCB1, SMARCE1, STK11, SUFU, TMEM127, TP53, TSC1, TSC2, VHL and XRCC2 (sequencing and deletion/duplication); EGFR, EGLN1, HOXB13, KIT, MITF, PDGFRA, POLD1 and POLE (sequencing only); EPCAM and GREM1 (deletion/duplication only). RNA data is routinely analyzed for use in variant interpretation for all genes.   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, margins negative, 0/1 lymph node negative ER 95%, PR 100%   07/09/2021 Cancer Staging   Staging form: Breast, AJCC 8th Edition - Pathologic stage from 07/09/2021: No Stage Recommended (ypT2, pN1a, cM0, G2) - Signed by [name], NP on 07/22/2021 Stage prefix: Post-therapy Histologic grading system: 3 grade system   07/2021 -  Radiation Therapy   Adjuvant radiation to follow surgery   11/2021 -  Anti-estrogen oral therapy   Letrozole x 5-7 years   Malignant neoplasm of upper-outer quadrant of left breast in female, estrogen receptor positive ([place])  12/12/2020 Initial Diagnosis   on the left, a clinical T1b N0, stage IA invasive ductal carcinoma, grade 1 or 2, estrogen and progesterone receptor positive, HER2 not amplified, with an MIB 1 of 15%   12/17/2020 Cancer Staging   Staging form: Breast, AJCC 8th Edition - Clinical stage from 12/17/2020: Stage IA (cT1b, cN0, cM0, G2, ER+, PR+, HER2-) - Signed by [name], NP on 01/14/2022 Stage prefix:  Initial diagnosis Histologic grading system: 3 grade system Laterality: Left Staged by: Pathologist and managing physician Stage used in treatment planning: Yes National guidelines used in treatment planning: Yes Type of national guideline used in treatment planning: NCCN   12/24/2020 Genetic Testing   Negative genetic testing:  No pathogenic variants detected on the [employer] BRCAplus panel (report date 12/24/2020) or the CancerNext-Expanded + RNAinsight panel (report date 12/31/2020). 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   The BRCAplus panel offered by [employer] and includes sequencing and deletion/duplication analysis for the following 8 genes: ATM, BRCA1, BRCA2, CDH1, CHEK2, PALB2, PTEN, and TP53. The CancerNext-Expanded + RNAinsight gene panel offered by [employer] and includes sequencing and rearrangement analysis for the following 77 genes: AIP, ALK, APC, ATM, AXIN2, BAP1, BARD1, BLM, BMPR1A, BRCA1, BRCA2, BRIP1, CDC73, CDH1, CDK4, CDKN1B, CDKN2A, CHEK2, CTNNA1, DICER1, FANCC, FH, FLCN, GALNT12, KIF1B, LZTR1, MAX, MEN1, MET, MLH1, MSH2, MSH3, MSH6, MUTYH, NBN, NF1, NF2, NTHL1, PALB2, PHOX2B, PMS2, POT1, PRKAR1A, PTCH1, PTEN, RAD51C, RAD51D, RB1, RECQL, RET, SDHA, SDHAF2, SDHB, SDHC, SDHD, SMAD4, SMARCA4, SMARCB1, SMARCE1, STK11, SUFU, TMEM127, TP53, TSC1, TSC2, VHL and XRCC2 (sequencing and deletion/duplication); EGFR, EGLN1, HOXB13, KIT, MITF, PDGFRA, POLD1 and POLE (sequencing only); EPCAM and GREM1 (deletion/duplication only). RNA data is routinely analyzed for use  in variant interpretation for all genes.   01/27/2021 -  Neo-Adjuvant Chemotherapy   Neoadjuvant chemotherapy with Doxorubicin and Cyclophosphamide given x 4 beginning 01/27/2021 and completing on 03/10/2021 followed by weekly paclitaxel x 12 beginning 03/24/2021   07/09/2021 Surgery   Right lumpectomy: Grade 2 invasive lobular cancer, 2.5 cm, LCIS, margins negative, LCIS  focally at anterior margin, 1/1 lymph node positive, ER 95%, PR 100%, HER2 negative, Ki-67 40% Left lumpectomy: High-grade DCIS: 0.7 cm, margins negative, 0/1 lymph node negative ER 95%, PR 100%   07/09/2021 Cancer Staging   Staging form: Breast, AJCC 8th Edition - Pathologic stage from 07/09/2021: No Stage Recommended (ypTis (DCIS), pN0, cM0) - Signed by Loa Socks, NP on 07/22/2021 Stage prefix: Post-therapy   07/2021 -  Radiation Therapy   Adjuvant radiation to follow surgery   11/2021 -  Anti-estrogen oral therapy   Letrozole x 5-7 years     CURRENT THERAPY: Letrozole  INTERVAL HISTORY: Chelsea Hansen 51 y.o. female returns for follow-up and evaluation of right axillary swelling.  She tells me that this is different than the fluid that was in her breast back in August.  At that time, she underwent bilateral breast mammogram and right breast ultrasound on January 21, 2022 that showed a 3 cm seroma.  She notes her right axilla has been swollen over the weekend it was so swollen that she could hardly put her arm down.  Is somewhat improved.  She has mild tenderness but no erythema warmth or drainage.  Chelsea Hansen continues on letrozole with good tolerance.    Patient Active Problem List   Diagnosis Date Noted   PSVT (paroxysmal supraventricular tachycardia) 07/08/2021   Pain management contract signed 05/04/2021   Chronic pain syndrome (breast cancer) 05/04/2021   Palpitations 03/25/2021   Sinus bradycardia 03/25/2021   Daytime somnolence 03/25/2021   Snoring 03/25/2021   Obesity (BMI 30-39.9) 03/25/2021   Port-A-Cath in place 01/27/2021   Genetic testing 12/24/2020   Family history of uterine cancer 12/17/2020   Malignant neoplasm of upper-outer quadrant of right breast in female, estrogen receptor positive (HCC) 12/12/2020   Malignant neoplasm of upper-outer quadrant of left breast in female, estrogen receptor positive ([facility]) 12/12/2020   Hypokalemia 01/02/2012   Asthma  01/01/2012   Bradycardia 01/01/2012   Syncope 12/31/2011   Mediastinal adenopathy 12/31/2011   Anemia 12/31/2011   Chest pain 12/31/2011    has No Known Allergies.  MEDICAL HISTORY: Past Medical History:  Diagnosis Date   Allergy    seasonal allergies   Anxiety    on meds   Asthma    uses inhaler   Breast cancer (HCC) 2012   RIGHT lumpectomy   Cancer (HCC) 2022   RIGHT breast lump-dx 2022   Depression    on meds   DVT (deep venous thrombosis) (HCC) 2010   after hysterectomy   Family history of uterine cancer    GERD (gastroesophageal reflux disease)    with certain foods/OTC PRN meds   Headache(784.0)    Hyperlipidemia    on meds   Hypertension    on meds   SVT (supraventricular tachycardia)     SURGICAL HISTORY: Past Surgical History:  Procedure Laterality Date   ABDOMINAL HYSTERECTOMY     AXILLARY SENTINEL NODE BIOPSY Left 07/09/2021   Procedure: LEFT AXILLARY SENTINEL NODE BIOPSY;  Surgeon: Abigail Miyamoto, MD;  Location: [facility];  Service: General;  Laterality: Left;   BREAST EXCISIONAL BIOPSY Right 09/16/2009  BREAST LUMPECTOMY WITH RADIOACTIVE SEED LOCALIZATION Bilateral 07/09/2021   Procedure: BILATERAL BREAST LUMPECTOMY WITH RADIOACTIVE SEED LOCALIZATION;  Surgeon: [name], MD;  Location: [facility];  Service: General;  Laterality: Bilateral;   BREAST SURGERY     lumpectomy   PORT-A-CATH REMOVAL Left 07/09/2021   Procedure: REMOVAL PORT-A-CATH;  Surgeon: [name], MD;  Location: [facility];  Service: General;  Laterality: Left;   PORTACATH PLACEMENT Left 01/26/2021   Procedure: INSERTION PORT-A-CATH;  Surgeon: [name], MD;  Location: [facility];  Service: General;  Laterality: Left;   RADIOACTIVE SEED GUIDED AXILLARY SENTINEL LYMPH NODE Right 07/09/2021   Procedure: RADIOACTIVE SEED GUIDED RIGHT AXILLARY SENTINEL LYMPH NODE DISSECTION;  Surgeon: [name], MD;  Location: [facility];  Service: General;  Laterality: Right;   TUBAL LIGATION      SOCIAL HISTORY: Social History   Socioeconomic History   Marital status: Single    Spouse name: Not on file   Number of children: Not on file   Years of education: Not on file   Highest education level: Not on file  Occupational History   Not on file  Tobacco Use   Smoking status: Never   Smokeless tobacco: Never  Vaping Use   Vaping Use: Never used  Substance and Sexual Activity   Alcohol use: Not Currently    Alcohol/week: 7.0 standard drinks of alcohol    Types: 7 Standard drinks or equivalent per week    Comment: occassional   Drug use: No   Sexual activity: Not Currently  Other Topics Concern   Not on file  Social History Narrative   Not on file   Social Determinants of Health   Financial Resource Strain: High Risk (01/14/2022)   Overall Financial Resource Strain (CARDIA)    Difficulty of Paying Living Expenses: Very hard  Food Insecurity: Food Insecurity Present (01/14/2022)   Hunger Vital Sign    Worried About Running Out of Food in the Last Year: Sometimes true    Ran Out of Food in the Last Year: Sometimes true  Transportation Needs: No Transportation Needs (12/02/2020)   PRAPARE - Hydrologist (Medical): No    Lack of Transportation (Non-Medical): No  Physical Activity: Not on file  Stress: Not on file  Social Connections: Not on file  Intimate Partner Violence: Not on file    FAMILY HISTORY: Family History  Problem Relation Age of Onset   Breast cancer Mother        30s   Hypertension Father    Uterine cancer Maternal Aunt    Ovarian cancer Maternal Aunt    Breast cancer Maternal Aunt        20s   Breast cancer Cousin 2   Uterine cancer Cousin 43   Colon polyps Neg Hx    Colon cancer Neg Hx    Esophageal cancer Neg Hx    Stomach cancer Neg Hx     Review of Systems  Constitutional:  Negative for appetite change, chills, fatigue,  fever and unexpected weight change.  HENT:   Negative for hearing loss, lump/mass and trouble swallowing.   Eyes:  Negative for eye problems and icterus.  Respiratory:  Negative for chest tightness, cough and shortness of breath.   Cardiovascular:  Negative for chest pain, leg swelling and palpitations.  Gastrointestinal:  Negative for abdominal distention, abdominal pain, constipation, diarrhea, nausea and vomiting.  Endocrine: Negative for hot flashes.  Genitourinary:  Negative for difficulty urinating.   Musculoskeletal:  Negative for arthralgias.  Skin:  Negative for itching and rash.  Neurological:  Negative for dizziness, extremity weakness, headaches and numbness.  Hematological:  Negative for adenopathy. Does not bruise/bleed easily.  Psychiatric/Behavioral:  Negative for depression. The patient is not nervous/anxious.       PHYSICAL EXAMINATION  ECOG PERFORMANCE STATUS: 1 - Symptomatic but completely ambulatory  Vitals:   04/14/22 0901  BP: (!) 149/92  Pulse: 72  Resp: 18  Temp: 97.7 F (36.5 C)  SpO2: 100%    Physical Exam Constitutional:      General: She is not in acute distress.    Appearance: Normal appearance. She is not toxic-appearing.  HENT:     Head: Normocephalic and atraumatic.  Eyes:     General: No scleral icterus. Cardiovascular:     Rate and Rhythm: Normal rate and regular rhythm.     Pulses: Normal pulses.     Heart sounds: Normal heart sounds.  Pulmonary:     Effort: Pulmonary effort is normal.     Breath sounds: Normal breath sounds.  Chest:     Comments: Difficulty palpating the right breast seroma at 10:00 5 cm from nipple there is some mild radiation changes and swelling noted in the right breast however her exam is benign.  Left breast is benign. Abdominal:     General: Abdomen is flat. Bowel sounds are normal. There is no distension.     Palpations: Abdomen is soft.     Tenderness: There is no abdominal tenderness.  Musculoskeletal:         General: No swelling.     Cervical back: Neck supple.  Lymphadenopathy:     Cervical: No cervical adenopathy.     Upper Body:     Right upper body: Axillary adenopathy present.  Skin:    General: Skin is warm and dry.     Findings: No rash.  Neurological:     General: No focal deficit present.     Mental Status: She is alert.  Psychiatric:        Mood and Affect: Mood normal.        Behavior: Behavior normal.     LABORATORY DATA: None for this visit    ASSESSMENT and THERAPY PLAN:   Malignant neoplasm of upper-outer quadrant of right breast in female, estrogen receptor positive ([place]) Chelsea Hansen is a 51 year old woman with history of left breast DCIS, right breast stage IIa ER/PR positive, HER2 negative invasive lobular carcinoma status post neoadjuvant chemotherapy, bilateral lumpectomies, adjuvant radiation and letrozole daily.  Chelsea Hansen is here for urgent evaluation of right axillary swelling.  Although it is somewhat improved it still has been waxing and waning for the past couple weeks.  I placed orders for an ultrasound of the right axilla to further evaluate.  Considering her breast exam is benign and her mammogram was last completed 2 months ago I do not feel strongly about making sure she has a mammogram however we will order it if radiology believes it is necessary.  I asked my nurse to help with scheduling the above.  I will see Chelsea Hansen back in approximately 2 weeks for follow-up on this concern.    All questions were answered. The patient knows to call the clinic with any problems, questions or concerns. We can certainly see the patient much sooner if necessary.  Total encounter time:20 minutes*in face-to-face visit time, chart review, lab review, care coordination, order entry, and documentation of  the encounter time.    Wilber Bihari, NP 04/14/22 9:40 AM Medical Oncology and Hematology [facility] [place], [place] [address] Tel.  [phone]    Fax. [phone]  *Total Encounter Time as defined by the Centers for Medicare and Medicaid Services includes, in addition to the face-to-face time of a patient visit (documented in the note above) non-face-to-face time: obtaining and reviewing outside history, ordering and reviewing medications, tests or procedures, care coordination (communications with other health care professionals or caregivers) and documentation in the medical record.

## 2022-04-14 NOTE — Telephone Encounter (Signed)
Patient informed of ultrasound appointment scheduled for 11/3 at 9:20 am at the [facility]. Patient provided with their phone number if they need to reschedule.

## 2022-04-16 ENCOUNTER — Ambulatory Visit: Payer: Medicaid Other

## 2022-04-20 ENCOUNTER — Ambulatory Visit: Payer: Medicaid Other

## 2022-04-20 DIAGNOSIS — R293 Abnormal posture: Secondary | ICD-10-CM

## 2022-04-20 DIAGNOSIS — C50411 Malignant neoplasm of upper-outer quadrant of right female breast: Secondary | ICD-10-CM | POA: Diagnosis not present

## 2022-04-20 DIAGNOSIS — I890 Lymphedema, not elsewhere classified: Secondary | ICD-10-CM

## 2022-04-20 DIAGNOSIS — L599 Disorder of the skin and subcutaneous tissue related to radiation, unspecified: Secondary | ICD-10-CM

## 2022-04-20 DIAGNOSIS — R209 Unspecified disturbances of skin sensation: Secondary | ICD-10-CM

## 2022-04-20 DIAGNOSIS — Z170 Estrogen receptor positive status [ER+]: Secondary | ICD-10-CM

## 2022-04-20 NOTE — Therapy (Signed)
OUTPATIENT PHYSICAL THERAPY ONCOLOGY TREATMENT  Patient Name: Chelsea Hansen MRN: [id number] DOB:1971-02-22, 51 y.o., female Today's Date: 04/20/2022   PT End of Session - 04/20/22 1202     Visit Number 6    Number of Visits 16    Date for PT Re-Evaluation 05/12/22    Authorization Type Medicaid    Authorization Time Period 04/05/2022-05/16/2022    Authorization - Visit Number 2    Authorization - Number of Visits 12    Progress Note Due on Visit 16    PT Start Time 1204    PT Stop Time 1250    PT Time Calculation (min) 46 min    Activity Tolerance Patient tolerated treatment well    Behavior During Therapy [facility] for tasks assessed/performed             Past Medical History:  Diagnosis Date   Allergy    seasonal allergies   Anxiety    on meds   Asthma    uses inhaler   Breast cancer ([place]) 2012   RIGHT lumpectomy   Cancer ([place]) 2022   RIGHT breast lump-dx 2022   Depression    on meds   DVT (deep venous thrombosis) ([place]) 2010   after hysterectomy   Family history of uterine cancer    GERD (gastroesophageal reflux disease)    with certain foods/OTC PRN meds   Headache(784.0)    Hyperlipidemia    on meds   Hypertension    on meds   SVT (supraventricular tachycardia)    Past Surgical History:  Procedure Laterality Date   ABDOMINAL HYSTERECTOMY     AXILLARY SENTINEL NODE BIOPSY Left 07/09/2021   Procedure: LEFT AXILLARY SENTINEL NODE BIOPSY;  Surgeon: [name], MD;  Location: [place];  Service: General;  Laterality: Left;   BREAST EXCISIONAL BIOPSY Right 09/16/2009   BREAST LUMPECTOMY WITH RADIOACTIVE SEED LOCALIZATION Bilateral 07/09/2021   Procedure: BILATERAL BREAST LUMPECTOMY WITH RADIOACTIVE SEED LOCALIZATION;  Surgeon: [name], MD;  Location: [place];  Service: General;  Laterality: Bilateral;   BREAST SURGERY     lumpectomy   PORT-A-CATH REMOVAL Left 07/09/2021   Procedure: REMOVAL PORT-A-CATH;   Surgeon: [name], MD;  Location: [place];  Service: General;  Laterality: Left;   PORTACATH PLACEMENT Left 01/26/2021   Procedure: INSERTION PORT-A-CATH;  Surgeon: [name], MD;  Location: [facility];  Service: General;  Laterality: Left;   RADIOACTIVE SEED GUIDED AXILLARY SENTINEL LYMPH NODE Right 07/09/2021   Procedure: RADIOACTIVE SEED GUIDED RIGHT AXILLARY SENTINEL LYMPH NODE DISSECTION;  Surgeon: [name], MD;  Location: [place];  Service: General;  Laterality: Right;   TUBAL LIGATION     Patient Active Problem List   Diagnosis Date Noted   PSVT (paroxysmal supraventricular tachycardia) 07/08/2021   Pain management contract signed 05/04/2021   Chronic pain syndrome (breast cancer) 05/04/2021   Palpitations 03/25/2021   Sinus bradycardia 03/25/2021   Daytime somnolence 03/25/2021   Snoring 03/25/2021   Obesity (BMI 30-39.9) 03/25/2021   Port-A-Cath in place 01/27/2021   Genetic testing 12/24/2020   Family history of uterine cancer 12/17/2020   Malignant neoplasm of upper-outer quadrant of right breast in female, estrogen receptor positive ([place]) 12/12/2020   Malignant neoplasm of upper-outer quadrant of left breast in female, estrogen receptor positive ([place]) 12/12/2020   Hypokalemia 01/02/2012   Asthma 01/01/2012   Bradycardia 01/01/2012   Syncope 12/31/2011   Mediastinal adenopathy 12/31/2011   Anemia 12/31/2011  Chest pain 12/31/2011    PCP: Juluis Mire,  REFERRING PROVIDER: Wilber Bihari, NP  REFERRING DIAG: Bilateral Breast swelling, decreased shoulder ROM bilaterally, Bilateral LE neuropathy  THERAPY DIAG:  Malignant neoplasm of upper-outer quadrant of right breast in female, estrogen receptor positive ([place])  Malignant neoplasm of upper-outer quadrant of left breast in female, estrogen receptor positive ([place])  Abnormal posture  Disturbance of skin sensation  Disorder of the skin and subcutaneous  tissue related to radiation, unspecified  Lymphedema, not elsewhere classified  ONSET DATE: 01/2021  Rationale for Evaluation and Treatment Rehabilitation  SUBJECTIVE                                                                                                                                                                                           SUBJECTIVE STATEMENT: My bones and everything ache from the weather. Swelling in axillary region is up and down. Pt requested her fiance come in to observe and try MLD.I tried the MLD and it helped. Spaghetti foam seems to help.  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 Yes NPRS scale: 5/10  Pain location:  Right breast/ axillary region Pain orientation: Right  PAIN TYPE: aching, burning, sharp, and tight Pain description: constant  Aggravating factors: household chores,lifting,sleeping,  Relieving factors: pain meds  PRECAUTIONS: Bilateral Lymphedema risk, neuropathy  WEIGHT BEARING RESTRICTIONS No  FALLS:  Has patient fallen in last 6 months? 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  HAND DOMINANCE : right   PRIOR LEVEL OF FUNCTION: Independent  PATIENT GOALS stop the pain,be able to reach without  pain, be able to clean   OBJECTIVE  COGNITION:  Overall cognitive status: Within functional limits for tasks assessed   PALPATION: Very tender throughout bilateral UQ  OBSERVATIONS / OTHER ASSESSMENTS: Both breast with generalized swelling and enlarged pores  SENSATION:  Light touch: Deficits at lateral breast, medial arm inact     POSTURE: Forward head, rounded shoulders  UPPER  EXTREMITY AROM/PROM:  A/PROM RIGHT   eval  Right 03/25/2022 Right 03/31/2022  Shoulder extension 42  48  Shoulder flexion 110 pulls under arm 148 150  Shoulder abduction 103 128 155  Shoulder internal rotation 55  60  Shoulder external rotation 79  93    (Blank rows = not tested)  A/PROM LEFT   eval Left 03/25/2022 Left 03/31/2022  Shoulder extension 52  52  Shoulder flexion 120 147 159  Shoulder abduction 115 154 168  Shoulder internal rotation 55  60  Shoulder external rotation 80  91    (Blank rows = not tested)   CERVICAL AROM: All within functional limits:     UPPER EXTREMITY STRENGTH: NT due to pain  LOWER EXTREMITY MMT: 4+/5  LYMPHEDEMA ASSESSMENTS:   SURGERY TYPE/DATE: 07/09/2021 BILATERAL BREAST LUMPECTOMY WITH RADIOACTIVE SEED LOCALIZATION RADIOACTIVE SEED GUIDED RIGHT AXILLARY TARGETED LYMPH NODE DISSECTION LEFT AXILLARY SENTINEL NODE BIOPSY LEFT AXILLARY LYMPH NODE MAPPING WITH SENTIMAG  NUMBER OF LYMPH NODES REMOVED: Right 1/1, Left 0/1  CHEMOTHERAPY: neoadjuvant  RADIATION:yes bilaterally  HORMONE TREATMENT: yes  INFECTIONS: no  LYMPHEDEMA ASSESSMENTS:   LANDMARK RIGHT  eval  10 cm proximal to olecranon process 30.5  Olecranon process 27.1  10 cm proximal to ulnar styloid process 21.2  Just proximal to ulnar styloid process 16.6  Across hand at thumb web space 21.4  At base of 2nd digit 6.8  (Blank rows = not tested)  LANDMARK LEFT  eval  10 cm proximal to olecranon process 30.7  Olecranon process 26.7  10 cm proximal to ulnar styloid process  20.5  Just proximal to ulnar styloid process 16.3  Across hand at thumb web space 21.1  At base of 2nd digit 6.7  (Blank rows = not tested)   FUNCTIONAL TESTS:   SLS ; 2-3 seconds ea leg GAIT: Distance walked: 20 Assistive device utilized: None Level of assistance: Complete Independence     QUICK DASH SURVEY: 77%,  03/31/2022: 31.82 BREAST COMPLAINTS SURVEY  :75  TODAY'S TREATMENT   04/20/2022  TG soft cut for right UE to decrease discomfort from cording; Advised to remove if any discomofrt or hand swelling Pt observed with increased swelling right axillary region with continued swelling at bilateral breast. Therapist performed short neck, 5 diaphragmatic breaths, activated right axillary and inguinal LN's, right axillo-inguinal pathway and right breast lateral to pathway, then medial right breast to lateral and pathway. Pt/pts fiance was instructed in short neck, breaths, LN activation and pathway. Pt was found to have several painful cords in the right axillary region. MFR was performed to cords with arm in scaption. MFR to right axillary cording was performed with pts arm in Flexion and slight ER Pt assisted on with her bra 04/12/2022 Pt observed with increased swelling right axillary region with continued swelling at bilateral breast. Adjusted pts bra up to the last notch to try and put compression more under the arm. Therapist performed short neck, 5 diaphragmatic breaths, activated right axillary and inguinal LN's, right axillo-inguinal pathway and right breast lateral to pathway, then medial right breast to lateral and pathway. Pt was instructed in short neck, breaths, LN activation and pathway and was given written handout. Pt was found to have several painful cords in the right axillary region. MFR was performed to cords with arm in scaption. PROM with MFR was performed for right shoulder flexion and abd.  03/31/2022 Pulleys x 1:30 flex, scaption, abduction Supine wand  flexion and scaption x 5 ea, star  gazer x 5 Measured bilateral shoulder ROM and checked goals Made spaghetti foam pads for medial side of bilateral breast where enlarged pores and fibrosis are present.  03/24/2022  Pulleys x 1:30 flexion, scaption, abd with VC's for proper technique, Ball rolls up wall x 10 Soft tissue mobilization to bilateral pectorals, UT, and lateral trunk with cocoa butter in supine PROM bilateral shoulders with VC's to relax arm Measured AROM   03/18/2022 Assessed bilateral breasts; medial breasts still with very enlarged pores. Instructed pt in 4 post op exercises to improve ROM with supine clasped hands flexion and star gazer x 5 ea., supine wand flexion and scaption x 5, standing wall slides and scapular retraction x 5 ea, Lat counter stretch x 5 Soft tissue mobilization to bilateral pectorals, UT, and lateral trunk with cocoa butter in supine    03/04/2022 Pt given script for compression bra and information to Purchase at [facility] [place]. Discussed POC and what she can expect with therapy.  PATIENT EDUCATION:  Education details: POC, Flexi touch, compression bra and gave script. (Pt in agreement with sending demo to [facility]) Person educated: Patient Education method: Explanation Education comprehension: verbalized understanding   HOME EXERCISE PROGRAM: Eval :None given today due to time  ASSESSMENT:  CLINICAL IMPRESSION:  Pt continues with increased right axillary swelling and with cording in the axilla. She is very tender just distal to the axilla. Pts fiance was instructed in MLD to the axillary region from short neck to LN activation and axillo-inguinal pathway. He required VC's and TC but did well overall and used good pressure.  OBJECTIVE IMPAIRMENTS decreased activity tolerance, decreased balance, decreased knowledge of condition, difficulty walking, decreased ROM, increased edema, increased fascial restrictions, impaired sensation,  impaired UE functional use, postural dysfunction, and pain.   ACTIVITY LIMITATIONS carrying, lifting, sleeping, bathing, dressing, and reach over head  PARTICIPATION LIMITATIONS: cleaning, occupation, and yard work  PERSONAL FACTORS 3+ comorbidities: Bilateral breast Cancer with chemo/radiation, pain management,  are also affecting patient's functional outcome.   REHAB POTENTIAL: Good  CLINICAL DECISION MAKING: Stable/uncomplicated  EVALUATION COMPLEXITY: Low  GOALS: Goals reviewed with patient? Yes  SHORT TERM GOALS: Target date: 05/11/2022    Pt will be independent and compliant with a HEP for shoulder ROM and strength Baseline: Goal status: MET 2.  Pt will have improved bilateral shoulder flexion and abd to atleast 135 Baseline:  Goal status: MET 03/31/2022  3. Pt will have Flexi touch trial for breast lymphedema In Progress:Having TODAY  LONG TERM GOALS: Target date: 06/01/2022    Pt will have bilateral shoulder flexion atleast 145 degrees and abduction atleast 160 degrees for improved reaching ability Baseline:  Goal status: MET left 03/31/2022, progressing Right  2.  Pts quick dash will be improved to no greater than 25% for improved function Baseline: 33% today Goal status: in PROGRESS 3.  Pt will be independent in self MLD to decrease bilateral breast swelling Baseline:  Goal status: In Progress  4.  Pt will purchase a compression bra to decrease breast swelling Baseline:  Goal status: MET 5.  Pt will be able to dress, bathe and perform reaching activities with minimal pain. Baseline:  Goal status: In Progress     PLAN: PT FREQUENCY: 2x/week  PT DURATION:  6 weeks  PLANNED INTERVENTIONS: Therapeutic exercises, Therapeutic activity, Neuromuscular re-education, Balance training, Patient/Family education, Self Care, Joint mobilization, Orthotic/Fit training, Manual lymph drainage, scar mobilization, Vasopneumatic device, Manual therapy, and  Re-evaluation  PLAN FOR NEXT SESSION:Recert,  pt to wear compression bra; how did spaghetti  foam medially in bra do? , initiate and instruct Bilateral breast MLD.How was [employer]?, ROM and STM prn.    Claris Pong, PT 04/20/2022, 1:02 PM

## 2022-04-22 ENCOUNTER — Ambulatory Visit: Payer: Medicaid Other | Attending: Adult Health

## 2022-04-22 DIAGNOSIS — R293 Abnormal posture: Secondary | ICD-10-CM | POA: Diagnosis present

## 2022-04-22 DIAGNOSIS — Z170 Estrogen receptor positive status [ER+]: Secondary | ICD-10-CM | POA: Diagnosis present

## 2022-04-22 DIAGNOSIS — L599 Disorder of the skin and subcutaneous tissue related to radiation, unspecified: Secondary | ICD-10-CM | POA: Diagnosis present

## 2022-04-22 DIAGNOSIS — I890 Lymphedema, not elsewhere classified: Secondary | ICD-10-CM | POA: Diagnosis present

## 2022-04-22 DIAGNOSIS — C50411 Malignant neoplasm of upper-outer quadrant of right female breast: Secondary | ICD-10-CM | POA: Diagnosis present

## 2022-04-22 DIAGNOSIS — R209 Unspecified disturbances of skin sensation: Secondary | ICD-10-CM | POA: Insufficient documentation

## 2022-04-22 DIAGNOSIS — C50412 Malignant neoplasm of upper-outer quadrant of left female breast: Secondary | ICD-10-CM | POA: Diagnosis present

## 2022-04-22 NOTE — Therapy (Signed)
OUTPATIENT PHYSICAL THERAPY ONCOLOGY TREATMENT  Patient Name: Chelsea Hansen MRN: [id number] DOB:[birth year], 51 y.o., female Today's Date: 04/22/2022   PT End of Session - 04/22/22 1206     Visit Number 7    Number of Visits 16    Date for PT Re-Evaluation 05/12/22    Authorization Type Medicaid    Authorization Time Period 04/05/2022-05/16/2022    Authorization - Visit Number 3    Authorization - Number of Visits 12    Progress Note Due on Visit 16    PT Start Time 1206    PT Stop Time 1251    PT Time Calculation (min) 45 min    Activity Tolerance Patient tolerated treatment well    Behavior During Therapy [facility] for tasks assessed/performed             Past Medical History:  Diagnosis Date   Allergy    seasonal allergies   Anxiety    on meds   Asthma    uses inhaler   Breast cancer ([place]) 2012   RIGHT lumpectomy   Cancer ([place]) 2022   RIGHT breast lump-dx 2022   Depression    on meds   DVT (deep venous thrombosis) ([place]) 2010   after hysterectomy   Family history of uterine cancer    GERD (gastroesophageal reflux disease)    with certain foods/OTC PRN meds   Headache(784.0)    Hyperlipidemia    on meds   Hypertension    on meds   SVT (supraventricular tachycardia)    Past Surgical History:  Procedure Laterality Date   ABDOMINAL HYSTERECTOMY     AXILLARY SENTINEL NODE BIOPSY Left 07/09/2021   Procedure: LEFT AXILLARY SENTINEL NODE BIOPSY;  Surgeon: [name], MD;  Location: [place];  Service: General;  Laterality: Left;   BREAST EXCISIONAL BIOPSY Right 09/16/2009   BREAST LUMPECTOMY WITH RADIOACTIVE SEED LOCALIZATION Bilateral 07/09/2021   Procedure: BILATERAL BREAST LUMPECTOMY WITH RADIOACTIVE SEED LOCALIZATION;  Surgeon: [name], MD;  Location: [place];  Service: General;  Laterality: Bilateral;   BREAST SURGERY     lumpectomy   PORT-A-CATH REMOVAL Left 07/09/2021   Procedure: REMOVAL PORT-A-CATH;   Surgeon: [name], MD;  Location: [place];  Service: General;  Laterality: Left;   PORTACATH PLACEMENT Left 01/26/2021   Procedure: INSERTION PORT-A-CATH;  Surgeon: [name], MD;  Location: [facility];  Service: General;  Laterality: Left;   RADIOACTIVE SEED GUIDED AXILLARY SENTINEL LYMPH NODE Right 07/09/2021   Procedure: RADIOACTIVE SEED GUIDED RIGHT AXILLARY SENTINEL LYMPH NODE DISSECTION;  Surgeon: [name], MD;  Location: [place];  Service: General;  Laterality: Right;   TUBAL LIGATION     Patient Active Problem List   Diagnosis Date Noted   PSVT (paroxysmal supraventricular tachycardia) 07/08/2021   Pain management contract signed 05/04/2021   Chronic pain syndrome (breast cancer) 05/04/2021   Palpitations 03/25/2021   Sinus bradycardia 03/25/2021   Daytime somnolence 03/25/2021   Snoring 03/25/2021   Obesity (BMI 30-39.9) 03/25/2021   Port-A-Cath in place 01/27/2021   Genetic testing 12/24/2020   Family history of uterine cancer 12/17/2020   Malignant neoplasm of upper-outer quadrant of right breast in female, estrogen receptor positive ([place]) 12/12/2020   Malignant neoplasm of upper-outer quadrant of left breast in female, estrogen receptor positive ([place]) 12/12/2020   Hypokalemia 01/02/2012   Asthma 01/01/2012   Bradycardia 01/01/2012   Syncope 12/31/2011   Mediastinal adenopathy 12/31/2011   Anemia 12/31/2011  Chest pain 12/31/2011    PCP: Juluis Mire,  REFERRING PROVIDER: Wilber Bihari, NP  REFERRING DIAG: Bilateral Breast swelling, decreased shoulder ROM bilaterally, Bilateral LE neuropathy  THERAPY DIAG:  Malignant neoplasm of upper-outer quadrant of right breast in female, estrogen receptor positive ([place])  Malignant neoplasm of upper-outer quadrant of left breast in female, estrogen receptor positive ([place])  Abnormal posture  Disturbance of skin sensation  Disorder of the skin and subcutaneous  tissue related to radiation, unspecified  Lymphedema, not elsewhere classified  ONSET DATE: 01/2021  Rationale for Evaluation and Treatment Rehabilitation  SUBJECTIVE                                                                                                                                                                                           SUBJECTIVE STATEMENT: The TG soft helped a lot. It felt more comfortable. We tried the MLD. My fiance was a little too hard when he was doing it. I have the [place] of axilla at 9:00 tomorrow. The cording is doing better  HISTORY Neoadjuvant chemotherapy with Doxorubicin and Cyclophosphamide given x 4 beginning 01/27/2021 and completing on 03/10/2021 followed by weekly paclitaxel x 12 beginning 05/2021 07/09/2021: Right lumpectomy: Grade 2 invasive lobular cancer, 2.5 cm, LCIS, margins negative, LCIS focally at anterior margin, 1/1 lymph node positive, ER 95%, PR 100%, HER2 negative, Ki-67 40% Left lumpectomy: High-grade DCIS: 0.7 cm, margins negative, 0/1 lymph node negative ER 95%, PR 100%  PAIN:  Are you having pain? Yes NPRS scale: 5/10  Pain location:  Right breast/ axillary region Pain orientation: Right  PAIN TYPE: aching, burning, sharp, and tight Pain description: constant  Aggravating factors: household chores,lifting,sleeping,  Relieving factors: pain meds  PRECAUTIONS: Bilateral Lymphedema risk, neuropathy  WEIGHT BEARING RESTRICTIONS No  FALLS:  Has patient fallen in last 6 months? 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  HAND DOMINANCE : right   PRIOR LEVEL OF FUNCTION: Independent  PATIENT GOALS stop the pain,be able to reach without pain, be able  to clean   OBJECTIVE  COGNITION:  Overall cognitive status: Within functional limits for tasks assessed   PALPATION: Very tender throughout bilateral UQ  OBSERVATIONS / OTHER ASSESSMENTS: Both breast with generalized swelling and enlarged pores  SENSATION:  Light touch: Deficits at lateral breast, medial arm inact     POSTURE: Forward head, rounded shoulders  UPPER EXTREMITY AROM/PROM:  A/PROM RIGHT   eval  Right 03/25/2022 Right 03/31/2022  Shoulder extension 42  48  Shoulder flexion 110 pulls under arm 148 150  Shoulder abduction 103 128 155  Shoulder internal rotation 55  60  Shoulder external rotation 79  93    (Blank rows = not tested)  A/PROM LEFT   eval Left 03/25/2022 Left 03/31/2022  Shoulder extension 52  52  Shoulder flexion 120 147 159  Shoulder abduction 115 154 168  Shoulder internal rotation 55  60  Shoulder external rotation 80  91    (Blank rows = not tested)   CERVICAL AROM: All within functional limits:     UPPER EXTREMITY STRENGTH: NT due to pain  LOWER EXTREMITY MMT: 4+/5  LYMPHEDEMA ASSESSMENTS:   SURGERY TYPE/DATE: 07/09/2021 BILATERAL BREAST LUMPECTOMY WITH RADIOACTIVE SEED LOCALIZATION RADIOACTIVE SEED GUIDED RIGHT AXILLARY TARGETED LYMPH NODE DISSECTION LEFT AXILLARY SENTINEL NODE BIOPSY LEFT AXILLARY LYMPH NODE MAPPING WITH SENTIMAG  NUMBER OF LYMPH NODES REMOVED: Right 1/1, Left 0/1  CHEMOTHERAPY: neoadjuvant  RADIATION:yes bilaterally  HORMONE TREATMENT: yes  INFECTIONS: no  LYMPHEDEMA ASSESSMENTS:   LANDMARK RIGHT  eval  10 cm proximal to olecranon process 30.5  Olecranon process 27.1  10 cm proximal to ulnar styloid process 21.2  Just proximal to ulnar styloid process 16.6  Across hand at thumb web space 21.4  At base of 2nd digit 6.8  (Blank rows = not tested)  LANDMARK LEFT  eval  10 cm proximal to olecranon process 30.7  Olecranon process 26.7  10 cm proximal to ulnar styloid process 20.5  Just  proximal to ulnar styloid process 16.3  Across hand at thumb web space 21.1  At base of 2nd digit 6.7  (Blank rows = not tested)   FUNCTIONAL TESTS:   SLS ; 2-3 seconds ea leg GAIT: Distance walked: 20 Assistive device utilized: None Level of assistance: Complete Independence     QUICK DASH SURVEY: 77%,  03/31/2022: 31.82 BREAST COMPLAINTS SURVEY  :75  TODAY'S TREATMENT   04/22/2022 Pulleys x 2 min flexion and abduction MFR to right axillary cording was performed with pts arm in Flexion and slight ER Brief PROM right shoulder KGY:[id number] performed short neck, 5 diaphragmatic breaths, activated right axillary and inguinal LN's, right axillo-inguinal pathway and right breast lateral to pathway, then medial right breast to lateral and pathway ending with LN's  Pt wears TG soft for comfort when not at work. 04/20/2022  TG soft cut for right UE to decrease discomfort from cording; Advised to remove if any discomofrt or hand swelling Pt observed with increased swelling right axillary region with continued swelling at bilateral breast. Therapist performed short neck, 5 diaphragmatic breaths, activated right axillary and inguinal LN's, right axillo-inguinal pathway and right breast lateral to pathway, then medial right breast to lateral and pathway. Pt/pts fiance was instructed in short neck, breaths, LN activation and pathway. Pt was found to have several painful cords in the right axillary region. MFR was performed to cords with arm in scaption. MFR to right axillary cording was performed with pts arm in Flexion and slight ER Pt assisted on with her bra 04/12/2022 Pt observed with increased swelling right axillary region with continued swelling at bilateral breast. Adjusted pts bra up to the last notch to try and put compression more under the arm. Therapist performed short neck, 5 diaphragmatic breaths, activated right axillary and inguinal LN's, right axillo-inguinal pathway and  right breast lateral to pathway, then medial  right breast to lateral and pathway. Pt was instructed in short neck, breaths, LN activation and pathway and was given written handout. Pt was found to have several painful cords in the right axillary region. MFR was performed to cords with arm in scaption. PROM with MFR was performed for right shoulder flexion and abd.  03/31/2022 Pulleys x 1:30 flex, scaption, abduction Supine wand flexion and scaption x 5 ea, star gazer x 5 Measured bilateral shoulder ROM and checked goals Made spaghetti foam pads for medial side of bilateral breast where enlarged pores and fibrosis are present.  03/24/2022  Pulleys x 1:30 flexion, scaption, abd with VC's for proper technique, Ball rolls up wall x 10 Soft tissue mobilization to bilateral pectorals, UT, and lateral trunk with cocoa butter in supine PROM bilateral shoulders with VC's to relax arm Measured AROM   03/18/2022 Assessed bilateral breasts; medial breasts still with very enlarged pores. Instructed pt in 4 post op exercises to improve ROM with supine clasped hands flexion and star gazer x 5 ea., supine wand flexion and scaption x 5, standing wall slides and scapular retraction x 5 ea, Lat counter stretch x 5 Soft tissue mobilization to bilateral pectorals, UT, and lateral trunk with cocoa butter in supine    03/04/2022 Pt given script for compression bra and information to Purchase at second to [employer]. Discussed POC and what she can expect with therapy.  PATIENT EDUCATION:  Education details: POC, Flexi touch, compression bra and gave script. (Pt in agreement with sending demo to [facility]) Person educated: Patient Education method: Explanation Education comprehension: verbalized understanding   HOME EXERCISE PROGRAM: Eval :None given today due to time  ASSESSMENT:  CLINICAL IMPRESSION:  Pt has [place] of axilla tomorrow. Cording seems improved overall but some cords still present. Pt  still having axillary pain even at rest rated 5/10 today. Pt has been compliant with MLD at home  OBJECTIVE IMPAIRMENTS decreased activity tolerance, decreased balance, decreased knowledge of condition, difficulty walking, decreased ROM, increased edema, increased fascial restrictions, impaired sensation, impaired UE functional use, postural dysfunction, and pain.   ACTIVITY LIMITATIONS carrying, lifting, sleeping, bathing, dressing, and reach over head  PARTICIPATION LIMITATIONS: cleaning, occupation, and yard work  PERSONAL FACTORS 3+ comorbidities: Bilateral breast Cancer with chemo/radiation, pain management,  are also affecting patient's functional outcome.   REHAB POTENTIAL: Good  CLINICAL DECISION MAKING: Stable/uncomplicated  EVALUATION COMPLEXITY: Low  GOALS: Goals reviewed with patient? Yes  SHORT TERM GOALS: Target date: 05/13/2022    Pt will be independent and compliant with a HEP for shoulder ROM and strength Baseline: Goal status: MET 2.  Pt will have improved bilateral shoulder flexion and abd to atleast 135 Baseline:  Goal status: MET 03/31/2022  3. Pt will have Flexi touch trial for breast lymphedema In Progress:Having TODAY  LONG TERM GOALS: Target date: 06/03/2022    Pt will have bilateral shoulder flexion atleast 145 degrees and abduction atleast 160 degrees for improved reaching ability Baseline:  Goal status: MET left 03/31/2022, progressing Right  2.  Pts quick dash will be improved to no greater than 25% for improved function Baseline: 33% today Goal status: in PROGRESS 3.  Pt will be independent in self MLD to decrease bilateral breast swelling Baseline:  Goal status: In Progress  4.  Pt will purchase a compression bra to decrease breast swelling Baseline:  Goal status: MET 5.  Pt will be able to dress, bathe and perform reaching activities with minimal pain. Baseline:  Goal status:  In Progress     PLAN: PT FREQUENCY: 2x/week  PT  DURATION:  6 weeks  PLANNED INTERVENTIONS: Therapeutic exercises, Therapeutic activity, Neuromuscular re-education, Balance training, Patient/Family education, Self Care, Joint mobilization, Orthotic/Fit training, Manual lymph drainage, scar mobilization, Vasopneumatic device, Manual therapy, and Re-evaluation  PLAN FOR NEXT SESSION:Recert, pt to wear compression bra; how did spaghetti  foam medially in bra do? , initiate and instruct Bilateral breast MLD.How was [employer]?, ROM and STM prn.    Claris Pong, PT 04/22/2022, 12:57 PM

## 2022-04-23 ENCOUNTER — Other Ambulatory Visit (HOSPITAL_COMMUNITY): Payer: Self-pay

## 2022-04-23 ENCOUNTER — Other Ambulatory Visit: Payer: Self-pay | Admitting: Adult Health

## 2022-04-23 ENCOUNTER — Ambulatory Visit
Admission: RE | Admit: 2022-04-23 | Discharge: 2022-04-23 | Disposition: A | Discharge status: Home / Self Care | Payer: Medicaid Other | Source: Ambulatory Visit | Attending: Adult Health | Admitting: Adult Health

## 2022-04-23 DIAGNOSIS — C50411 Malignant neoplasm of upper-outer quadrant of right female breast: Secondary | ICD-10-CM

## 2022-04-23 DIAGNOSIS — M7989 Other specified soft tissue disorders: Secondary | ICD-10-CM

## 2022-04-26 ENCOUNTER — Ambulatory Visit
Admission: RE | Admit: 2022-04-26 | Discharge: 2022-04-26 | Disposition: A | Discharge status: Home / Self Care | Payer: Medicaid Other | Source: Ambulatory Visit | Attending: Adult Health | Admitting: Adult Health

## 2022-04-26 DIAGNOSIS — C50411 Malignant neoplasm of upper-outer quadrant of right female breast: Secondary | ICD-10-CM

## 2022-04-26 DIAGNOSIS — M7989 Other specified soft tissue disorders: Secondary | ICD-10-CM

## 2022-04-27 ENCOUNTER — Ambulatory Visit: Payer: Medicaid Other

## 2022-04-29 ENCOUNTER — Ambulatory Visit: Payer: Medicaid Other

## 2022-04-29 ENCOUNTER — Encounter: Payer: Self-pay | Admitting: Adult Health

## 2022-04-29 ENCOUNTER — Inpatient Hospital Stay: Payer: Medicaid Other | Attending: Adult Health | Admitting: Adult Health

## 2022-04-29 ENCOUNTER — Other Ambulatory Visit: Payer: Self-pay

## 2022-04-29 VITALS — BP 157/82 | HR 62 | Temp 98.1°F | Resp 16 | Ht 71.0 in | Wt 219.7 lb

## 2022-04-29 DIAGNOSIS — Z79811 Long term (current) use of aromatase inhibitors: Secondary | ICD-10-CM | POA: Diagnosis not present

## 2022-04-29 DIAGNOSIS — C50411 Malignant neoplasm of upper-outer quadrant of right female breast: Secondary | ICD-10-CM

## 2022-04-29 DIAGNOSIS — Z170 Estrogen receptor positive status [ER+]: Secondary | ICD-10-CM

## 2022-04-29 DIAGNOSIS — R209 Unspecified disturbances of skin sensation: Secondary | ICD-10-CM

## 2022-04-29 DIAGNOSIS — M546 Pain in thoracic spine: Secondary | ICD-10-CM | POA: Insufficient documentation

## 2022-04-29 DIAGNOSIS — R293 Abnormal posture: Secondary | ICD-10-CM

## 2022-04-29 DIAGNOSIS — C50412 Malignant neoplasm of upper-outer quadrant of left female breast: Secondary | ICD-10-CM

## 2022-04-29 DIAGNOSIS — L599 Disorder of the skin and subcutaneous tissue related to radiation, unspecified: Secondary | ICD-10-CM

## 2022-04-29 DIAGNOSIS — I890 Lymphedema, not elsewhere classified: Secondary | ICD-10-CM

## 2022-04-29 NOTE — Progress Notes (Signed)
[facility] Follow up:    [name], NP [address] [id number]   DIAGNOSIS:  Cancer Staging  Malignant neoplasm of upper-outer quadrant of left breast in female, estrogen receptor positive ([place]) Staging form: Breast, AJCC 8th Edition - Clinical stage from 12/17/2020: Stage IA (cT1b, cN0, cM0, G2, ER+, PR+, HER2-) - Signed by [name], NP on 01/14/2022 Stage prefix: Initial diagnosis Histologic grading system: 3 grade system Laterality: Left Staged by: Pathologist and managing physician Stage used in treatment planning: Yes National guidelines used in treatment planning: Yes Type of national guideline used in treatment planning: NCCN - Pathologic stage from 07/09/2021: No Stage Recommended (ypTis (DCIS), pN0, cM0) - Signed by [name], NP on 07/22/2021 Stage prefix: Post-therapy  Malignant neoplasm of upper-outer quadrant of right breast in female, estrogen receptor positive ([place]) Staging form: Breast, AJCC 8th Edition - Clinical stage from 12/17/2020: Stage IIA (cT2, cN1, cM0, G2, ER+, PR+, HER2-) - Signed by [name], NP on 01/14/2022 Stage prefix: Initial diagnosis Histologic grading system: 3 grade system Laterality: Right Staged by: Pathologist and managing physician Stage used in treatment planning: Yes National guidelines used in treatment planning: Yes Type of national guideline used in treatment planning: NCCN - Pathologic stage from 07/09/2021: No Stage Recommended (ypT2, pN1a, cM0, G2) - Signed by [name], NP on 07/22/2021 Stage prefix: Post-therapy Histologic grading system: 3 grade system   SUMMARY OF ONCOLOGIC HISTORY: Oncology History  Malignant neoplasm of upper-outer quadrant of right breast in female, estrogen receptor positive ([place])  12/12/2020 Initial Diagnosis   status post bilateral breast biopsies 12/03/2020, showing             (1) on the right, a  clinical T2 N1, stage IIa invasive lobular carcinoma, grade 2, estrogen and progesterone receptor strongly positive, HER2 not amplified, with an MIB-1 of 40%                         (a) the biopsied right axillary lymph node was positive with extracapsular extension                         (b) a second right breast mass also biopsied was a fibroadenoma, concordant  MAMMAPRINT tested on biopsy returned high risk, luminal type B, indicating significant benefit from chemo                         (c) biopsy of an area of non-mass-like enhancement in the upper right breast pending   12/17/2020 Cancer Staging   Staging form: Breast, AJCC 8th Edition - Clinical stage from 12/17/2020: Stage IIA (cT2, cN1, cM0, G2, ER+, PR+, HER2-) - Signed by [name], NP on 01/14/2022 Stage prefix: Initial diagnosis Histologic grading system: 3 grade system Laterality: Right Staged by: Pathologist and managing physician Stage used in treatment planning: Yes National guidelines used in treatment planning: Yes Type of national guideline used in treatment planning: NCCN   12/24/2020 Genetic Testing   Negative genetic testing:  No pathogenic variants detected on the [employer] BRCAplus panel (report date 12/24/2020) or the CancerNext-Expanded + RNAinsight panel (report date 12/31/2020). 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   The BRCAplus panel offered by [employer] and includes sequencing and deletion/duplication analysis for the following 8 genes: ATM, BRCA1, BRCA2, CDH1, CHEK2,  PALB2, PTEN, and TP53. The CancerNext-Expanded + RNAinsight gene panel offered by [employer] and includes sequencing and rearrangement analysis for the following 77 genes: AIP, ALK, APC, ATM, AXIN2, BAP1, BARD1, BLM, BMPR1A, BRCA1, BRCA2, BRIP1, CDC73, CDH1, CDK4, CDKN1B, CDKN2A, CHEK2, CTNNA1, DICER1, FANCC, FH, FLCN, GALNT12, KIF1B, LZTR1, MAX, MEN1, MET, MLH1, MSH2, MSH3,  MSH6, MUTYH, NBN, NF1, NF2, NTHL1, PALB2, PHOX2B, PMS2, POT1, PRKAR1A, PTCH1, PTEN, RAD51C, RAD51D, RB1, RECQL, RET, SDHA, SDHAF2, SDHB, SDHC, SDHD, SMAD4, SMARCA4, SMARCB1, SMARCE1, STK11, SUFU, TMEM127, TP53, TSC1, TSC2, VHL and XRCC2 (sequencing and deletion/duplication); EGFR, EGLN1, HOXB13, KIT, MITF, PDGFRA, POLD1 and POLE (sequencing only); EPCAM and GREM1 (deletion/duplication only). RNA data is routinely analyzed for use in variant interpretation for all genes.   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, margins negative, 0/1 lymph node negative ER 95%, PR 100%   07/09/2021 Cancer Staging   Staging form: Breast, AJCC 8th Edition - Pathologic stage from 07/09/2021: No Stage Recommended (ypT2, pN1a, cM0, G2) - Signed by [name], NP on 07/22/2021 Stage prefix: Post-therapy Histologic grading system: 3 grade system   07/2021 -  Radiation Therapy   Adjuvant radiation to follow surgery   11/2021 -  Anti-estrogen oral therapy   Letrozole x 5-7 years; changed to anastrozole   Malignant neoplasm of upper-outer quadrant of left breast in female, estrogen receptor positive ([place])  12/12/2020 Initial Diagnosis   on the left, a clinical T1b N0, stage IA invasive ductal carcinoma, grade 1 or 2, estrogen and progesterone receptor positive, HER2 not amplified, with an MIB 1 of 15%   12/17/2020 Cancer Staging   Staging form: Breast, AJCC 8th Edition - Clinical stage from 12/17/2020: Stage IA (cT1b, cN0, cM0, G2, ER+, PR+, HER2-) - Signed by [name], NP on  01/14/2022 Stage prefix: Initial diagnosis Histologic grading system: 3 grade system Laterality: Left Staged by: Pathologist and managing physician Stage used in treatment planning: Yes National guidelines used in treatment planning: Yes Type of national guideline used in treatment planning: NCCN   12/24/2020 Genetic Testing   Negative genetic testing:  No pathogenic variants detected on the [facility] BRCAplus panel (report date 12/24/2020) or the CancerNext-Expanded + RNAinsight panel (report date 12/31/2020). 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   The BRCAplus panel offered by [employer] and includes sequencing and deletion/duplication analysis for the following 8 genes: ATM, BRCA1, BRCA2, CDH1, CHEK2, PALB2, PTEN, and TP53. The CancerNext-Expanded + RNAinsight gene panel offered by [employer] and includes sequencing and rearrangement analysis for the following 77 genes: AIP, ALK, APC, ATM, AXIN2, BAP1, BARD1, BLM, BMPR1A, BRCA1, BRCA2, BRIP1, CDC73, CDH1, CDK4, CDKN1B, CDKN2A, CHEK2, CTNNA1, DICER1, FANCC, FH, FLCN, GALNT12, KIF1B, LZTR1, MAX, MEN1, MET, MLH1, MSH2, MSH3, MSH6, MUTYH, NBN, NF1, NF2, NTHL1, PALB2, PHOX2B, PMS2, POT1, PRKAR1A, PTCH1, PTEN, RAD51C, RAD51D, RB1, RECQL, RET, SDHA, SDHAF2, SDHB, SDHC, SDHD, SMAD4, SMARCA4, SMARCB1, SMARCE1, STK11, SUFU, TMEM127, TP53, TSC1, TSC2, VHL and XRCC2 (sequencing and deletion/duplication); EGFR, EGLN1, HOXB13, KIT, MITF, PDGFRA, POLD1 and POLE (sequencing only); EPCAM and GREM1 (deletion/duplication only). RNA data is routinely  analyzed for use in variant interpretation for all genes.   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 female returns for follow-up and evaluation after I ordered an ultrasound to evaluate for right axillary swelling when I saw her on October 25.  She underwent right axillary ultrasound on April 23, 2022 which demonstrated an indeterminate irregular mass 1.2 x 0.6 x 0.9 cm in size.  A biopsy was recommended.  She underwent biopsy of this mass on April 26, 2022 which demonstrated mature adipose tissue consistent with the clinical impression of lipoma in the right axilla results were concordant.  She was recommended to follow-up with bilateral annual diagnostic mammogram which is due in August 2024.  She says her axilla is sore from the biopsy, but otherwise well.  She is taking Letrozole daily is tolerating it well.  She turns 51 next week and has plans to go to her mom's house in [place].  She continues to see physical therapy and goes for lymphedema treatment.  She is concerned about her persistent intermittent swelling.   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   GERD (gastroesophageal reflux disease) 05/27/2021   Hyperlipidemia 05/27/2021   Hypertension 05/27/2021   Pain management contract signed  05/04/2021   Chronic pain syndrome (breast cancer) 05/04/2021   Palpitations 03/25/2021   Sinus bradycardia 03/25/2021   Daytime somnolence 03/25/2021   Snoring 03/25/2021   Obesity (BMI 30-39.9) 03/25/2021   Port-A-Cath in place 01/27/2021   Genetic testing 12/24/2020   Family history of uterine cancer 12/17/2020   Malignant neoplasm of upper-outer quadrant of right breast in female, estrogen receptor positive ([place]) 12/12/2020   Malignant neoplasm of upper-outer quadrant of left breast in female, estrogen receptor positive ([place]) 12/12/2020   Hypokalemia 01/02/2012   Asthma 01/01/2012   Bradycardia 01/01/2012   Syncope 12/31/2011   Mediastinal adenopathy 12/31/2011   Anemia 12/31/2011   Chest pain 12/31/2011    has No Known Allergies.  MEDICAL HISTORY: Past Medical History:  Diagnosis Date   Allergy    seasonal allergies   Anxiety    on meds   Asthma    uses inhaler   Breast cancer ([place]) 2012   RIGHT lumpectomy   Cancer ([place]) 2022   RIGHT breast lump-dx 2022   Depression    on meds   DVT (deep venous thrombosis) ([place]) 2010   after hysterectomy   Family history of uterine cancer    GERD (gastroesophageal reflux disease)    with  certain foods/OTC PRN meds   Headache(784.0)    Hyperlipidemia    on meds   Hypertension    on meds   SVT (supraventricular tachycardia)     SURGICAL HISTORY: Past Surgical History:  Procedure Laterality Date   ABDOMINAL HYSTERECTOMY     AXILLARY SENTINEL NODE BIOPSY Left 07/09/2021   Procedure: LEFT AXILLARY SENTINEL NODE BIOPSY;  Surgeon: [name], MD;  Location: [facility];  Service: General;  Laterality: Left;   BREAST EXCISIONAL BIOPSY Right 09/16/2009   BREAST LUMPECTOMY WITH RADIOACTIVE SEED LOCALIZATION Bilateral 07/09/2021   Procedure: BILATERAL BREAST LUMPECTOMY WITH RADIOACTIVE SEED LOCALIZATION;  Surgeon: [name], MD;  Location: [facility];  Service: General;  Laterality:  Bilateral;   BREAST SURGERY     lumpectomy   PORT-A-CATH REMOVAL Left 07/09/2021   Procedure: REMOVAL PORT-A-CATH;  Surgeon: [name], MD;  Location: [facility];  Service: General;  Laterality: Left;   PORTACATH PLACEMENT Left 01/26/2021   Procedure: INSERTION PORT-A-CATH;  Surgeon: [name], MD;  Location: [facility];  Service: General;  Laterality: Left;   RADIOACTIVE SEED GUIDED AXILLARY SENTINEL LYMPH NODE Right 07/09/2021   Procedure: RADIOACTIVE SEED GUIDED RIGHT AXILLARY SENTINEL LYMPH NODE DISSECTION;  Surgeon: [name], MD;  Location: [facility];  Service: General;  Laterality: Right;   TUBAL LIGATION      SOCIAL HISTORY: Social History   Socioeconomic History   Marital status: Single    Spouse name: Not on file   Number of children: Not on file   Years of education: Not on file   Highest education level: Not on file  Occupational History   Not on file  Tobacco Use   Smoking status: Never   Smokeless tobacco: Never  Vaping Use   Vaping Use: Never used  Substance and Sexual Activity   Alcohol use: Not Currently    Alcohol/week: 7.0 standard drinks of alcohol    Types: 7 Standard drinks or equivalent per week    Comment: occassional   Drug use: No   Sexual activity: Not Currently  Other Topics Concern   Not on file  Social History Narrative   Not on file   Social Determinants of Health   Financial Resource Strain: High Risk (01/14/2022)   Overall Financial Resource Strain (CARDIA)    Difficulty of Paying Living Expenses: Very hard  Food Insecurity: Food Insecurity Present (01/14/2022)   Hunger Vital Sign    Worried About Running Out of Food in the Last Year: Sometimes true    Ran Out of Food in the Last Year: Sometimes true  Transportation Needs: No Transportation Needs (12/02/2020)   PRAPARE - Hydrologist (Medical): No    Lack of Transportation (Non-Medical): No  Physical Activity:  Not on file  Stress: Not on file  Social Connections: Not on file  Intimate Partner Violence: Not on file    FAMILY HISTORY: Family History  Problem Relation Age of Onset   Breast cancer Mother        24s   Hypertension Father    Uterine cancer Maternal Aunt    Ovarian cancer Maternal Aunt    Breast cancer Maternal Aunt        20s   Breast cancer Cousin 34   Uterine cancer Cousin 10   Colon polyps Neg Hx    Colon cancer Neg Hx    Esophageal cancer Neg Hx    Stomach cancer Neg Hx  Review of Systems  Constitutional:  Negative for appetite change, chills, fatigue, fever and unexpected weight change.  HENT:   Negative for hearing loss, lump/mass and trouble swallowing.   Eyes:  Negative for eye problems and icterus.  Respiratory:  Negative for chest tightness, cough and shortness of breath.   Cardiovascular:  Negative for chest pain, leg swelling and palpitations.  Gastrointestinal:  Negative for abdominal distention, abdominal pain, constipation, diarrhea, nausea and vomiting.  Endocrine: Negative for hot flashes.  Genitourinary:  Negative for difficulty urinating.   Musculoskeletal:  Negative for arthralgias.  Skin:  Negative for itching and rash.  Neurological:  Negative for dizziness, extremity weakness, headaches and numbness.  Hematological:  Negative for adenopathy. Does not bruise/bleed easily.  Psychiatric/Behavioral:  Negative for depression. The patient is not nervous/anxious.       PHYSICAL EXAMINATION  ECOG PERFORMANCE STATUS: 1 - Symptomatic but completely ambulatory  Vitals:   04/29/22 0943  BP: (!) 157/82  Pulse: 62  Resp: 16  Temp: 98.1 F (36.7 C)  SpO2: 100%    Physical Exam Constitutional:      General: She is not in acute distress.    Appearance: Normal appearance. She is not toxic-appearing.  HENT:     Head: Normocephalic and atraumatic.  Eyes:     General: No scleral icterus. Cardiovascular:     Rate and Rhythm: Normal rate and  regular rhythm.     Pulses: Normal pulses.     Heart sounds: Normal heart sounds.  Pulmonary:     Effort: Pulmonary effort is normal.     Breath sounds: Normal breath sounds.  Abdominal:     General: Abdomen is flat. Bowel sounds are normal. There is no distension.     Palpations: Abdomen is soft.     Tenderness: There is no abdominal tenderness.  Musculoskeletal:        General: No swelling.     Cervical back: Neck supple.  Lymphadenopathy:     Cervical: No cervical adenopathy.     Upper Body:     Right upper body: Axillary adenopathy (Right axilla is swollen with lymphadenopathy status postbiopsy no sign of infection) present.  Skin:    General: Skin is warm and dry.     Findings: No rash.  Neurological:     General: No focal deficit present.     Mental Status: She is alert.  Psychiatric:        Mood and Affect: Mood normal.        Behavior: Behavior normal.     LABORATORY DATA: None for this visit     ASSESSMENT and THERAPY PLAN:   Malignant neoplasm of upper-outer quadrant of right breast in female, estrogen receptor positive ([place]) [place] is a 51 year old woman with history of left breast DCIS, right breast stage IIa ER/PR positive, HER2 negative invasive lobular carcinoma status post neoadjuvant chemotherapy, bilateral lumpectomies, adjuvant radiation and letrozole daily.  Tyshauna is here today for follow-up of her right axillary swelling.  She underwent the ultrasound and biopsy which were negative which is great news.    She is following with physical therapy and will continue to see them as scheduled.  I placed orders for CT chest to complete the work-up of this new onset swelling as she is at increased risk for recurrence considering her stage at diagnosis.    Rebeccah will return in March 2024 for follow-up and we will touch base with her after she undergoes the CT chest.  All questions  were answered. The patient knows to call the clinic with any problems,  questions or concerns. We can certainly see the patient much sooner if necessary.  Total encounter time:20 minutes*in face-to-face visit time, chart review, lab review, care coordination, order entry, and documentation of the encounter time.  Wilber Bihari, NP 04/29/22 10:25 AM Medical Oncology and Hematology [facility] [place], [place] [address] Tel. [phone]    Fax. [phone]  *Total Encounter Time as defined by the Centers for Medicare and Medicaid Services includes, in addition to the face-to-face time of a patient visit (documented in the note above) non-face-to-face time: obtaining and reviewing outside history, ordering and reviewing medications, tests or procedures, care coordination (communications with other health care professionals or caregivers) and documentation in the medical record.

## 2022-04-29 NOTE — Therapy (Signed)
OUTPATIENT PHYSICAL THERAPY ONCOLOGY TREATMENT  Patient Name: Chelsea Hansen MRN: [id number] DOB:05-17-1971, 51 y.o., female Today's Date: 04/29/2022   PT End of Session - 04/29/22 0804     Visit Number 8    Number of Visits 16    Date for PT Re-Evaluation 05/12/22    Authorization Type Medicaid    Authorization Time Period 04/05/2022-05/16/2022    Authorization - Visit Number 4    Authorization - Number of Visits 12    Progress Note Due on Visit 56    PT Start Time 0805    PT Stop Time 2952    PT Time Calculation (min) 48 min    Activity Tolerance Patient tolerated treatment well    Behavior During Therapy [facility] for tasks assessed/performed             Past Medical History:  Diagnosis Date   Allergy    seasonal allergies   Anxiety    on meds   Asthma    uses inhaler   Breast cancer ([place]) 2012   RIGHT lumpectomy   Cancer ([place]) 2022   RIGHT breast lump-dx 2022   Depression    on meds   DVT (deep venous thrombosis) ([place]) 2010   after hysterectomy   Family history of uterine cancer    GERD (gastroesophageal reflux disease)    with certain foods/OTC PRN meds   Headache(784.0)    Hyperlipidemia    on meds   Hypertension    on meds   SVT (supraventricular tachycardia)    Past Surgical History:  Procedure Laterality Date   ABDOMINAL HYSTERECTOMY     AXILLARY SENTINEL NODE BIOPSY Left 07/09/2021   Procedure: LEFT AXILLARY SENTINEL NODE BIOPSY;  Surgeon: [name], MD;  Location: [place];  Service: General;  Laterality: Left;   BREAST EXCISIONAL BIOPSY Right 09/16/2009   BREAST LUMPECTOMY WITH RADIOACTIVE SEED LOCALIZATION Bilateral 07/09/2021   Procedure: BILATERAL BREAST LUMPECTOMY WITH RADIOACTIVE SEED LOCALIZATION;  Surgeon: [name], MD;  Location: [place];  Service: General;  Laterality: Bilateral;   BREAST SURGERY     lumpectomy   PORT-A-CATH REMOVAL Left 07/09/2021   Procedure: REMOVAL PORT-A-CATH;   Surgeon: [name], MD;  Location: [place];  Service: General;  Laterality: Left;   PORTACATH PLACEMENT Left 01/26/2021   Procedure: INSERTION PORT-A-CATH;  Surgeon: [name], MD;  Location: [facility];  Service: General;  Laterality: Left;   RADIOACTIVE SEED GUIDED AXILLARY SENTINEL LYMPH NODE Right 07/09/2021   Procedure: RADIOACTIVE SEED GUIDED RIGHT AXILLARY SENTINEL LYMPH NODE DISSECTION;  Surgeon: [name], MD;  Location: [facility];  Service: General;  Laterality: Right;   TUBAL LIGATION     Patient Active Problem List   Diagnosis Date Noted   PSVT (paroxysmal supraventricular tachycardia) 07/08/2021   Pain management contract signed 05/04/2021   Chronic pain syndrome (breast cancer) 05/04/2021   Palpitations 03/25/2021   Sinus bradycardia 03/25/2021   Daytime somnolence 03/25/2021   Snoring 03/25/2021   Obesity (BMI 30-39.9) 03/25/2021   Port-A-Cath in place 01/27/2021   Genetic testing 12/24/2020   Family history of uterine cancer 12/17/2020   Malignant neoplasm of upper-outer quadrant of right breast in female, estrogen receptor positive ([place]) 12/12/2020   Malignant neoplasm of upper-outer quadrant of left breast in female, estrogen receptor positive ([place]) 12/12/2020   Hypokalemia 01/02/2012   Asthma 01/01/2012   Bradycardia 01/01/2012   Syncope 12/31/2011   Mediastinal adenopathy 12/31/2011   Anemia 12/31/2011  Chest pain 12/31/2011    PCP: Juluis Mire,  REFERRING PROVIDER: Wilber Bihari, NP  REFERRING DIAG: Bilateral Breast swelling, decreased shoulder ROM bilaterally, Bilateral LE neuropathy  THERAPY DIAG:  Malignant neoplasm of upper-outer quadrant of right breast in female, estrogen receptor positive ([place])  Malignant neoplasm of upper-outer quadrant of left breast in female, estrogen receptor positive ([place])  Abnormal posture  Disturbance of skin sensation  Disorder of the skin and subcutaneous  tissue related to radiation, unspecified  Lymphedema, not elsewhere classified  ONSET DATE: 01/2021  Rationale for Evaluation and Treatment Rehabilitation  SUBJECTIVE                                                                                                                                                                                           SUBJECTIVE STATEMENT: Pt had an axillary biopsy on Monday which was Negative but she is still sore in the axillary region and continues with swelling.. No drainage from that area and covered with steri strips.  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 Yes NPRS scale: 5/10  Pain location:  Right breast/ axillary region,  Left breast Pain orientation: Right  PAIN TYPE: aching, burning, sharp, and tight Pain description: constant  Aggravating factors: household chores,lifting,sleeping,  Relieving factors: pain meds  PRECAUTIONS: Bilateral Lymphedema risk, neuropathy  WEIGHT BEARING RESTRICTIONS No  FALLS:  Has patient fallen in last 6 months? 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  HAND DOMINANCE : right   PRIOR LEVEL OF FUNCTION: Independent  PATIENT GOALS stop the pain,be able to reach without pain,  be able to clean   OBJECTIVE  COGNITION:  Overall cognitive status: Within functional limits for tasks assessed   PALPATION: Very tender throughout bilateral UQ  OBSERVATIONS / OTHER ASSESSMENTS: Both breast with generalized swelling and enlarged pores  SENSATION:  Light touch: Deficits at lateral breast, medial arm inact     POSTURE: Forward head, rounded shoulders  UPPER EXTREMITY AROM/PROM:  A/PROM RIGHT   eval  Right 03/25/2022 Right 03/31/2022  Shoulder extension 42  48  Shoulder flexion 110 pulls under arm 148 150  Shoulder abduction 103 128 155  Shoulder internal rotation 55  60  Shoulder external rotation 79  93    (Blank rows = not tested)  A/PROM LEFT   eval Left 03/25/2022 Left 03/31/2022  Shoulder extension 52  52  Shoulder flexion 120 147 159  Shoulder abduction 115 154 168  Shoulder internal rotation 55  60  Shoulder external rotation 80  91    (Blank rows = not tested)   CERVICAL AROM: All within functional limits:     UPPER EXTREMITY STRENGTH: NT due to pain  LOWER EXTREMITY MMT: 4+/5  LYMPHEDEMA ASSESSMENTS:   SURGERY TYPE/DATE: 07/09/2021 BILATERAL BREAST LUMPECTOMY WITH RADIOACTIVE SEED LOCALIZATION RADIOACTIVE SEED GUIDED RIGHT AXILLARY TARGETED LYMPH NODE DISSECTION LEFT AXILLARY SENTINEL NODE BIOPSY LEFT AXILLARY LYMPH NODE MAPPING WITH SENTIMAG  NUMBER OF LYMPH NODES REMOVED: Right 1/1, Left 0/1  CHEMOTHERAPY: neoadjuvant  RADIATION:yes bilaterally  HORMONE TREATMENT: yes  INFECTIONS: no  LYMPHEDEMA ASSESSMENTS:   LANDMARK RIGHT  eval  10 cm proximal to olecranon process 30.5  Olecranon process 27.1  10 cm proximal to ulnar styloid process 21.2  Just proximal to ulnar styloid process 16.6  Across hand at thumb web space 21.4  At base of 2nd digit 6.8  (Blank rows = not tested)  LANDMARK LEFT  eval  10 cm proximal to olecranon process 30.7  Olecranon process 26.7  10 cm proximal to ulnar styloid process 20.5   Just proximal to ulnar styloid process 16.3  Across hand at thumb web space 21.1  At base of 2nd digit 6.7  (Blank rows = not tested)   FUNCTIONAL TESTS:   SLS ; 2-3 seconds ea leg GAIT: Distance walked: 20 Assistive device utilized: None Level of assistance: Complete Independence     QUICK DASH SURVEY: 77%,  03/31/2022: 31.82 BREAST COMPLAINTS SURVEY  :75  TODAY'S TREATMENT   04/29/2022  MAU:[id number] performed short neck, 5 diaphragmatic breaths, activated right axillary and inguinal LN's, right axillo-inguinal pathway and right breast lateral to pathway, then medial right breast to lateral and pathway ending with LN's. Therapist then did the same steps but on the left breast. PROM bilateral shoulders flex, scaption ,abd MFR to right axillary area of cording AROM bilateral shoulder flexion, scaption, horizontal abd.    04/22/2022 Pulleys x 2 min flexion and abduction MFR to right axillary cording was performed with pts arm in Flexion and slight ER Brief PROM right shoulder BWL:[id number] performed short neck, 5 diaphragmatic breaths, activated right axillary and inguinal LN's, right axillo-inguinal pathway and right breast lateral to pathway, then medial right breast to lateral and pathway ending with LN's  Pt wears TG soft for comfort when not at work. 04/20/2022  TG soft cut for right UE to decrease discomfort from cording; Advised to remove if any discomofrt or hand swelling Pt observed with increased swelling right axillary region with continued swelling at bilateral breast. Therapist performed short neck, 5 diaphragmatic breaths, activated right axillary and inguinal LN's, right axillo-inguinal pathway and right breast lateral to pathway, then medial right breast to lateral and pathway. Pt/pts fiance was instructed in short neck, breaths, LN activation and pathway. Pt was found to have several painful cords in the right axillary region. MFR was performed to cords with  arm in scaption. MFR to right axillary cording was performed with pts arm in Flexion  and slight ER Pt assisted on with her bra 04/12/2022 Pt observed with increased swelling right axillary region with continued swelling at bilateral breast. Adjusted pts bra up to the last notch to try and put compression more under the arm. Therapist performed short neck, 5 diaphragmatic breaths, activated right axillary and inguinal LN's, right axillo-inguinal pathway and right breast lateral to pathway, then medial right breast to lateral and pathway. Pt was instructed in short neck, breaths, LN activation and pathway and was given written handout. Pt was found to have several painful cords in the right axillary region. MFR was performed to cords with arm in scaption. PROM with MFR was performed for right shoulder flexion and abd.  03/31/2022 Pulleys x 1:30 flex, scaption, abduction Supine wand flexion and scaption x 5 ea, star gazer x 5 Measured bilateral shoulder ROM and checked goals Made spaghetti foam pads for medial side of bilateral breast where enlarged pores and fibrosis are present.  03/24/2022  Pulleys x 1:30 flexion, scaption, abd with VC's for proper technique, Ball rolls up wall x 10 Soft tissue mobilization to bilateral pectorals, UT, and lateral trunk with cocoa butter in supine PROM bilateral shoulders with VC's to relax arm Measured AROM   03/18/2022 Assessed bilateral breasts; medial breasts still with very enlarged pores. Instructed pt in 4 post op exercises to improve ROM with supine clasped hands flexion and star gazer x 5 ea., supine wand flexion and scaption x 5, standing wall slides and scapular retraction x 5 ea, Lat counter stretch x 5 Soft tissue mobilization to bilateral pectorals, UT, and lateral trunk with cocoa butter in supine    03/04/2022 Pt given script for compression bra and information to Purchase at second to [place]. Discussed POC and what she can expect with  therapy.  PATIENT EDUCATION:  Education details: POC, Flexi touch, compression bra and gave script. (Pt in agreement with sending demo to [facility]) Person educated: Patient Education method: Explanation Education comprehension: verbalized understanding   HOME EXERCISE PROGRAM: Eval :None given today due to time  ASSESSMENT:  CLINICAL IMPRESSION:  Bilateral breast swelling overall improved  overall but right continues to have enlarged pores and axillary swelling greater than left.  Mild cording still present in right axilla. Pt has not had any news from Flexi touch yet.  OBJECTIVE IMPAIRMENTS decreased activity tolerance, decreased balance, decreased knowledge of condition, difficulty walking, decreased ROM, increased edema, increased fascial restrictions, impaired sensation, impaired UE functional use, postural dysfunction, and pain.   ACTIVITY LIMITATIONS carrying, lifting, sleeping, bathing, dressing, and reach over head  PARTICIPATION LIMITATIONS: cleaning, occupation, and yard work  PERSONAL FACTORS 3+ comorbidities: Bilateral breast Cancer with chemo/radiation, pain management,  are also affecting patient's functional outcome.   REHAB POTENTIAL: Good  CLINICAL DECISION MAKING: Stable/uncomplicated  EVALUATION COMPLEXITY: Low  GOALS: Goals reviewed with patient? Yes  SHORT TERM GOALS: Target date: 05/20/2022    Pt will be independent and compliant with a HEP for shoulder ROM and strength Baseline: Goal status: MET 2.  Pt will have improved bilateral shoulder flexion and abd to atleast 135 Baseline:  Goal status: MET 03/31/2022  3. Pt will have Flexi touch trial for breast lymphedema In Progress:Having TODAY  LONG TERM GOALS: Target date: 06/10/2022    Pt will have bilateral shoulder flexion atleast 145 degrees and abduction atleast 160 degrees for improved reaching ability Baseline:  Goal status: MET left 03/31/2022, progressing Right  2.  Pts quick dash  will be improved to no greater  than 25% for improved function Baseline: 33% today Goal status: in PROGRESS 3.  Pt will be independent in self MLD to decrease bilateral breast swelling Baseline:  Goal status: In Progress  4.  Pt will purchase a compression bra to decrease breast swelling Baseline:  Goal status: MET 5.  Pt will be able to dress, bathe and perform reaching activities with minimal pain. Baseline:  Goal status: In Progress     PLAN: PT FREQUENCY: 2x/week  PT DURATION:  6 weeks  PLANNED INTERVENTIONS: Therapeutic exercises, Therapeutic activity, Neuromuscular re-education, Balance training, Patient/Family education, Self Care, Joint mobilization, Orthotic/Fit training, Manual lymph drainage, scar mobilization, Vasopneumatic device, Manual therapy, and Re-evaluation  PLAN FOR NEXT SESSION:Recert, pt to wear compression bra; how did spaghetti  foam medially in bra do? , initiate and instruct Bilateral breast MLD.How was [employer]?, ROM and STM prn.    Claris Pong, PT 04/29/2022, 8:56 AM

## 2022-04-29 NOTE — Assessment & Plan Note (Signed)
Jouri is a 51 year old woman with history of left breast DCIS, right breast stage IIa ER/PR positive, HER2 negative invasive lobular carcinoma status post neoadjuvant chemotherapy, bilateral lumpectomies, adjuvant radiation and letrozole daily.  Alexsia is here today for follow-up of her right axillary swelling.  She underwent the ultrasound and biopsy which were negative which is great news.    She is following with physical therapy and will continue to see them as scheduled.  I placed orders for CT chest to complete the work-up of this new onset swelling as she is at increased risk for recurrence considering her stage at diagnosis.    Salimata will return in March 2024 for follow-up and we will touch base with her after she undergoes the CT chest.

## 2022-05-05 ENCOUNTER — Ambulatory Visit: Payer: Medicaid Other

## 2022-05-05 DIAGNOSIS — C50411 Malignant neoplasm of upper-outer quadrant of right female breast: Secondary | ICD-10-CM | POA: Diagnosis not present

## 2022-05-05 DIAGNOSIS — R209 Unspecified disturbances of skin sensation: Secondary | ICD-10-CM

## 2022-05-05 DIAGNOSIS — I890 Lymphedema, not elsewhere classified: Secondary | ICD-10-CM

## 2022-05-05 DIAGNOSIS — R293 Abnormal posture: Secondary | ICD-10-CM

## 2022-05-05 DIAGNOSIS — L599 Disorder of the skin and subcutaneous tissue related to radiation, unspecified: Secondary | ICD-10-CM

## 2022-05-05 DIAGNOSIS — Z170 Estrogen receptor positive status [ER+]: Secondary | ICD-10-CM

## 2022-05-05 NOTE — Therapy (Signed)
OUTPATIENT PHYSICAL THERAPY ONCOLOGY TREATMENT  Patient Name: Chelsea Hansen MRN: [id number] DOB:06-Nov-1970, 51 y.o., female Today's Date: 05/05/2022   PT End of Session - 05/05/22 0814     Visit Number 9    Number of Visits 16    Date for PT Re-Evaluation 05/12/22    Authorization Type Medicaid    Authorization Time Period 04/05/2022-05/16/2022    Authorization - Visit Number 5    Authorization - Number of Visits 12    Progress Note Due on Visit 16    PT Start Time 0815   pt late   PT Stop Time 0854    PT Time Calculation (min) 39 min    Activity Tolerance Patient tolerated treatment well    Behavior During Therapy [facility] for tasks assessed/performed             Past Medical History:  Diagnosis Date   Allergy    seasonal allergies   Anxiety    on meds   Asthma    uses inhaler   Breast cancer ([place]) 2012   RIGHT lumpectomy   Cancer ([place]) 2022   RIGHT breast lump-dx 2022   Depression    on meds   DVT (deep venous thrombosis) ([place]) 2010   after hysterectomy   Family history of uterine cancer    GERD (gastroesophageal reflux disease)    with certain foods/OTC PRN meds   Headache(784.0)    Hyperlipidemia    on meds   Hypertension    on meds   SVT (supraventricular tachycardia)    Past Surgical History:  Procedure Laterality Date   ABDOMINAL HYSTERECTOMY     AXILLARY SENTINEL NODE BIOPSY Left 07/09/2021   Procedure: LEFT AXILLARY SENTINEL NODE BIOPSY;  Surgeon: [name], MD;  Location: [place];  Service: General;  Laterality: Left;   BREAST EXCISIONAL BIOPSY Right 09/16/2009   BREAST LUMPECTOMY WITH RADIOACTIVE SEED LOCALIZATION Bilateral 07/09/2021   Procedure: BILATERAL BREAST LUMPECTOMY WITH RADIOACTIVE SEED LOCALIZATION;  Surgeon: [name], MD;  Location: [place];  Service: General;  Laterality: Bilateral;   BREAST SURGERY     lumpectomy   PORT-A-CATH REMOVAL Left 07/09/2021   Procedure: REMOVAL  PORT-A-CATH;  Surgeon: [name], MD;  Location: [place];  Service: General;  Laterality: Left;   PORTACATH PLACEMENT Left 01/26/2021   Procedure: INSERTION PORT-A-CATH;  Surgeon: [name], MD;  Location: [facility];  Service: General;  Laterality: Left;   RADIOACTIVE SEED GUIDED AXILLARY SENTINEL LYMPH NODE Right 07/09/2021   Procedure: RADIOACTIVE SEED GUIDED RIGHT AXILLARY SENTINEL LYMPH NODE DISSECTION;  Surgeon: [name], MD;  Location: [place];  Service: General;  Laterality: Right;   TUBAL LIGATION     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   GERD (gastroesophageal reflux disease) 05/27/2021   Hyperlipidemia 05/27/2021   Hypertension 05/27/2021   Pain management contract signed 05/04/2021   Chronic pain syndrome (breast cancer) 05/04/2021   Palpitations 03/25/2021   Sinus bradycardia 03/25/2021   Daytime somnolence 03/25/2021   Snoring 03/25/2021   Obesity (BMI 30-39.9) 03/25/2021   Port-A-Cath in place 01/27/2021   Genetic testing 12/24/2020   Family history of uterine cancer 12/17/2020   Malignant neoplasm of upper-outer quadrant of right breast in female, estrogen receptor positive ([place]) 12/12/2020   Malignant neoplasm of upper-outer quadrant of left breast in female, estrogen receptor positive ([place])  12/12/2020   Hypokalemia 01/02/2012   Asthma 01/01/2012   Bradycardia 01/01/2012   Syncope 12/31/2011   Mediastinal adenopathy 12/31/2011   Anemia 12/31/2011   Chest pain 12/31/2011    PCP: Juluis Mire,  REFERRING PROVIDER: Wilber Bihari, NP  REFERRING DIAG: Bilateral Breast swelling, decreased shoulder ROM bilaterally, Bilateral LE neuropathy  THERAPY DIAG:  Malignant neoplasm of upper-outer quadrant of right breast in female, estrogen receptor positive ([place])  Malignant neoplasm of  upper-outer quadrant of left breast in female, estrogen receptor positive ([place])  Abnormal posture  Disturbance of skin sensation  Disorder of the skin and subcutaneous tissue related to radiation, unspecified  Lymphedema, not elsewhere classified  ONSET DATE: 01/2021  Rationale for Evaluation and Treatment Rehabilitation  SUBJECTIVE                                                                                                                                                                                           SUBJECTIVE STATEMENT: I have a CT of my chest next week to rule out anything that could be causing the swelling.  I have been doing some MLD every day.  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 Yes NPRS scale: 3/10  Pain location:  Right breast/ axillary region,  Pain orientation: Right  PAIN TYPE: aching, burning, sharp, and tight Pain description: constant  Aggravating factors: household chores,lifting,sleeping,  Relieving factors: pain meds  PRECAUTIONS: Bilateral Lymphedema risk, neuropathy  WEIGHT BEARING RESTRICTIONS No  FALLS:  Has patient fallen in last 6 months? 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  HAND DOMINANCE : right    PRIOR LEVEL OF FUNCTION: Independent  PATIENT GOALS stop the pain,be able to reach without pain, be able to clean   OBJECTIVE  COGNITION:  Overall cognitive status: Within functional limits for tasks assessed   PALPATION: Very tender throughout bilateral UQ  OBSERVATIONS / OTHER ASSESSMENTS: Both breast with generalized swelling and enlarged pores  SENSATION:  Light  touch: Deficits at lateral breast, medial arm inact     POSTURE: Forward head, rounded shoulders  UPPER EXTREMITY AROM/PROM:  A/PROM RIGHT   eval  Right 03/25/2022 Right 03/31/2022 Right 05/05/2022  Shoulder extension 42  48   Shoulder flexion 110 pulls under arm 148 150 159  Shoulder abduction 103 128 155 178  Shoulder internal rotation 55  60   Shoulder external rotation 79  93     (Blank rows = not tested)  A/PROM LEFT   eval Left 03/25/2022 Left 03/31/2022 Left  05/05/2022  Shoulder extension 52  52   Shoulder flexion 120 147 159 159  Shoulder abduction 115 154 168 178  Shoulder internal rotation 55  60   Shoulder external rotation 80  91     (Blank rows = not tested)   CERVICAL AROM: All within functional limits:     UPPER EXTREMITY STRENGTH: NT due to pain  LOWER EXTREMITY MMT: 4+/5  LYMPHEDEMA ASSESSMENTS:   SURGERY TYPE/DATE: 07/09/2021 BILATERAL BREAST LUMPECTOMY WITH RADIOACTIVE SEED LOCALIZATION RADIOACTIVE SEED GUIDED RIGHT AXILLARY TARGETED LYMPH NODE DISSECTION LEFT AXILLARY SENTINEL NODE BIOPSY LEFT AXILLARY LYMPH NODE MAPPING WITH SENTIMAG  NUMBER OF LYMPH NODES REMOVED: Right 1/1, Left 0/1  CHEMOTHERAPY: neoadjuvant  RADIATION:yes bilaterally  HORMONE TREATMENT: yes  INFECTIONS: no  LYMPHEDEMA ASSESSMENTS:   LANDMARK RIGHT  eval  10 cm proximal to olecranon process 30.5  Olecranon process 27.1  10 cm proximal to ulnar styloid process 21.2  Just proximal to ulnar styloid process 16.6  Across hand at thumb web space 21.4  At base of 2nd digit 6.8  (Blank  rows = not tested)  LANDMARK LEFT  eval  10 cm proximal to olecranon process 30.7  Olecranon process 26.7  10 cm proximal to ulnar styloid process 20.5  Just proximal to ulnar styloid process 16.3  Across hand at thumb web space 21.1  At base of 2nd digit 6.7  (Blank rows = not tested)   FUNCTIONAL TESTS:   SLS ; 2-3 seconds ea leg GAIT: Distance walked: 20 Assistive device utilized: None Level of assistance: Complete Independence     QUICK DASH SURVEY: 77%,  03/31/2022: 31.82 BREAST COMPLAINTS SURVEY  :75  TODAY'S TREATMENT   05/05/2022 Pulleys x 2 min flexion and abduction [id number] performed short neck, 5 diaphragmatic breaths, activated right axillary and inguinal LN's, right axillo-inguinal pathway and right breast lateral to pathway, then medial right breast to lateral and pathway ending with LN's. Emphasis on axilla and medial breast   04/29/2022  IRJ:[id number] performed short neck, 5 diaphragmatic breaths, activated right axillary and inguinal LN's, right axillo-inguinal pathway and right breast lateral to pathway, then medial right breast to lateral and pathway ending with LN's. Therapist then did the same steps but on the left breast. PROM bilateral shoulders flex, scaption ,abd MFR to right axillary area of cording AROM bilateral shoulder flexion, scaption, horizontal abd.    04/22/2022 Pulleys x 2 min flexion and abduction MFR to right axillary cording was performed with pts arm in Flexion and slight ER Brief PROM right shoulder [id number] performed short neck, 5 diaphragmatic breaths, activated right axillary and inguinal LN's, right axillo-inguinal pathway and right breast lateral to pathway, then medial right breast to lateral and pathway ending with LN's  Pt wears TG soft for comfort when not at work. 04/20/2022  TG soft cut for right UE to decrease discomfort from cording; Advised to remove if any discomofrt or hand swelling Pt observed with  increased  swelling right axillary region with continued swelling at bilateral breast. Therapist performed short neck, 5 diaphragmatic breaths, activated right axillary and inguinal LN's, right axillo-inguinal pathway and right breast lateral to pathway, then medial right breast to lateral and pathway. Pt/pts fiance was instructed in short neck, breaths, LN activation and pathway. Pt was found to have several painful cords in the right axillary region. MFR was performed to cords with arm in scaption. MFR to right axillary cording was performed with pts arm in Flexion and slight ER Pt assisted on with her bra 04/12/2022 Pt observed with increased swelling right axillary region with continued swelling at bilateral breast. Adjusted pts bra up to the last notch to try and put compression more under the arm. Therapist performed short neck, 5 diaphragmatic breaths, activated right axillary and inguinal LN's, right axillo-inguinal pathway and right breast lateral to pathway, then medial right breast to lateral and pathway. Pt was instructed in short neck, breaths, LN activation and pathway and was given written handout. Pt was found to have several painful cords in the right axillary region. MFR was performed to cords with arm in scaption. PROM with MFR was performed for right shoulder flexion and abd.  03/31/2022 Pulleys x 1:30 flex, scaption, abduction Supine wand flexion and scaption x 5 ea, star gazer x 5 Measured bilateral shoulder ROM and checked goals Made spaghetti foam pads for medial side of bilateral breast where enlarged pores and fibrosis are present.  03/24/2022  Pulleys x 1:30 flexion, scaption, abd with VC's for proper technique, Ball rolls up wall x 10 Soft tissue mobilization to bilateral pectorals, UT, and lateral trunk with cocoa butter in supine PROM bilateral shoulders with VC's to relax arm Measured AROM   03/18/2022 Assessed bilateral breasts; medial breasts still with very  enlarged pores. Instructed pt in 4 post op exercises to improve ROM with supine clasped hands flexion and star gazer x 5 ea., supine wand flexion and scaption x 5, standing wall slides and scapular retraction x 5 ea, Lat counter stretch x 5 Soft tissue mobilization to bilateral pectorals, UT, and lateral trunk with cocoa butter in supine    03/04/2022 Pt given script for compression bra and information to Purchase at [facility] [place]. Discussed POC and what she can expect with therapy.  PATIENT EDUCATION:  Education details: POC, Flexi touch, compression bra and gave script. (Pt in agreement with sending demo to [facility]) Person educated: Patient Education method: Explanation Education comprehension: verbalized understanding   HOME EXERCISE PROGRAM: Eval :None given today due to time  ASSESSMENT:  CLINICAL IMPRESSION: Pt has achieved ROM and functional goal for shoulders.  She continues to have bilateral breast swelling but improved overall with enlarged pores on right breast only medial. Pt continues with increased right axillary swelling. She is pending a CT next week  OBJECTIVE IMPAIRMENTS decreased activity tolerance, decreased balance, decreased knowledge of condition, difficulty walking, decreased ROM, increased edema, increased fascial restrictions, impaired sensation, impaired UE functional use, postural dysfunction, and pain.   ACTIVITY LIMITATIONS carrying, lifting, sleeping, bathing, dressing, and reach over head  PARTICIPATION LIMITATIONS: cleaning, occupation, and yard work  PERSONAL FACTORS 3+ comorbidities: Bilateral breast Cancer with chemo/radiation, pain management,  are also affecting patient's functional outcome.   REHAB POTENTIAL: Good  CLINICAL DECISION MAKING: Stable/uncomplicated  EVALUATION COMPLEXITY: Low  GOALS: Goals reviewed with patient? Yes  SHORT TERM GOALS: Target date: 05/26/2022    Pt will be independent and compliant with a HEP for  shoulder ROM  and strength Baseline: Goal status: MET 2.  Pt will have improved bilateral shoulder flexion and abd to atleast 135 Baseline:  Goal status: MET 03/31/2022  3. Pt will have Flexi touch trial for breast lymphedema In Progress:MET  LONG TERM GOALS: Target date: 06/16/2022    Pt will have bilateral shoulder flexion atleast 145 degrees and abduction atleast 160 degrees for improved reaching ability Baseline:  Goal status: MET left 03/31/2022, progressing Right MET Right 05/05/2022  2.  Pts quick dash will be improved to no greater than 25% for improved function Baseline: 33% today Goal status: in PROGRESS 3.  Pt will be independent in self MLD to decrease bilateral breast swelling Baseline:  Goal status: In Progress  4.  Pt will purchase a compression bra to decrease breast swelling Baseline:  Goal status: MET 5.  Pt will be able to dress, bathe and perform reaching activities with minimal pain. Baseline:  Goal status: MET 05/05/2022     PLAN: PT FREQUENCY: 2x/week  PT DURATION:  6 weeks  PLANNED INTERVENTIONS: Therapeutic exercises, Therapeutic activity, Neuromuscular re-education, Balance training, Patient/Family education, Self Care, Joint mobilization, Orthotic/Fit training, Manual lymph drainage, scar mobilization, Vasopneumatic device, Manual therapy, and Re-evaluation  PLAN FOR NEXT SESSION:Recert, pt to wear compression bra; how did spaghetti  foam medially in bra do? , initiate and instruct Bilateral breast MLD.How was [employer]?, ROM and STM prn.    Claris Pong, PT 05/05/2022, 8:56 AM

## 2022-05-10 ENCOUNTER — Ambulatory Visit: Payer: Medicaid Other

## 2022-05-10 ENCOUNTER — Other Ambulatory Visit (HOSPITAL_COMMUNITY): Payer: Self-pay

## 2022-05-10 DIAGNOSIS — L599 Disorder of the skin and subcutaneous tissue related to radiation, unspecified: Secondary | ICD-10-CM

## 2022-05-10 DIAGNOSIS — I890 Lymphedema, not elsewhere classified: Secondary | ICD-10-CM

## 2022-05-10 DIAGNOSIS — C50411 Malignant neoplasm of upper-outer quadrant of right female breast: Secondary | ICD-10-CM | POA: Diagnosis not present

## 2022-05-10 DIAGNOSIS — R209 Unspecified disturbances of skin sensation: Secondary | ICD-10-CM

## 2022-05-10 DIAGNOSIS — R293 Abnormal posture: Secondary | ICD-10-CM

## 2022-05-10 DIAGNOSIS — Z170 Estrogen receptor positive status [ER+]: Secondary | ICD-10-CM

## 2022-05-10 NOTE — Therapy (Addendum)
OUTPATIENT PHYSICAL THERAPY ONCOLOGY TREATMENT  Patient Name: Chelsea Hansen MRN: [id number] DOB:31-Jan-1971, 51 y.o., female Today's Date: 05/10/2022   PT End of Session - 05/10/22 0755     Visit Number 10    Number of Visits 16    Date for PT Re-Evaluation 05/12/22    Authorization Type Medicaid    Authorization Time Period 04/05/2022-05/16/2022    Authorization - Number of Visits 12    Progress Note Due on Visit 16    PT Start Time 0800    PT Stop Time 0850    PT Time Calculation (min) 50 min    Activity Tolerance Patient tolerated treatment well    Behavior During Therapy [facility] for tasks assessed/performed             Past Medical History:  Diagnosis Date   Allergy    seasonal allergies   Anxiety    on meds   Asthma    uses inhaler   Breast cancer ([place]) 2012   RIGHT lumpectomy   Cancer ([place]) 2022   RIGHT breast lump-dx 2022   Depression    on meds   DVT (deep venous thrombosis) ([place]) 2010   after hysterectomy   Family history of uterine cancer    GERD (gastroesophageal reflux disease)    with certain foods/OTC PRN meds   Headache(784.0)    Hyperlipidemia    on meds   Hypertension    on meds   SVT (supraventricular tachycardia)    Past Surgical History:  Procedure Laterality Date   ABDOMINAL HYSTERECTOMY     AXILLARY SENTINEL NODE BIOPSY Left 07/09/2021   Procedure: LEFT AXILLARY SENTINEL NODE BIOPSY;  Surgeon: [name], MD;  Location: [place];  Service: General;  Laterality: Left;   BREAST EXCISIONAL BIOPSY Right 09/16/2009   BREAST LUMPECTOMY WITH RADIOACTIVE SEED LOCALIZATION Bilateral 07/09/2021   Procedure: BILATERAL BREAST LUMPECTOMY WITH RADIOACTIVE SEED LOCALIZATION;  Surgeon: [name], MD;  Location: [facility];  Service: General;  Laterality: Bilateral;   BREAST SURGERY     lumpectomy   PORT-A-CATH REMOVAL Left 07/09/2021   Procedure: REMOVAL PORT-A-CATH;  Surgeon: [name], MD;   Location: [facility];  Service: General;  Laterality: Left;   PORTACATH PLACEMENT Left 01/26/2021   Procedure: INSERTION PORT-A-CATH;  Surgeon: [name], MD;  Location: [facility];  Service: General;  Laterality: Left;   RADIOACTIVE SEED GUIDED AXILLARY SENTINEL LYMPH NODE Right 07/09/2021   Procedure: RADIOACTIVE SEED GUIDED RIGHT AXILLARY SENTINEL LYMPH NODE DISSECTION;  Surgeon: [name], MD;  Location: [facility];  Service: General;  Laterality: Right;   TUBAL LIGATION     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   GERD (gastroesophageal reflux disease) 05/27/2021   Hyperlipidemia 05/27/2021   Hypertension 05/27/2021   Pain management contract signed 05/04/2021   Chronic pain syndrome (breast cancer) 05/04/2021   Palpitations 03/25/2021   Sinus bradycardia 03/25/2021   Daytime somnolence 03/25/2021   Snoring 03/25/2021   Obesity (BMI 30-39.9) 03/25/2021   Port-A-Cath in place 01/27/2021   Genetic testing 12/24/2020   Family history of uterine cancer 12/17/2020   Malignant neoplasm of upper-outer quadrant of right breast in female, estrogen receptor positive ([place]) 12/12/2020   Malignant neoplasm of upper-outer quadrant of left breast in female, estrogen receptor positive ([place]) 12/12/2020   Hypokalemia 01/02/2012   Asthma 01/01/2012  Bradycardia 01/01/2012   Syncope 12/31/2011   Mediastinal adenopathy 12/31/2011   Anemia 12/31/2011   Chest pain 12/31/2011    PCP: Juluis Mire,  REFERRING PROVIDER: Wilber Bihari, NP  REFERRING DIAG: Bilateral Breast swelling, decreased shoulder ROM bilaterally, Bilateral LE neuropathy  THERAPY DIAG:  Malignant neoplasm of upper-outer quadrant of right breast in female, estrogen receptor positive ([place])  Malignant neoplasm of upper-outer quadrant of left breast in female,  estrogen receptor positive ([place])  Abnormal posture  Disturbance of skin sensation  Disorder of the skin and subcutaneous tissue related to radiation, unspecified  Lymphedema, not elsewhere classified  ONSET DATE: 01/2021  Rationale for Evaluation and Treatment Rehabilitation  SUBJECTIVE                                                                                                                                                                                           SUBJECTIVE STATEMENT: I am feeling good except for being exhausted after my 4 day celebration. My legs are really sore from dancing all weekend. I wore the compression bra some while I was sleeping. Right armpit is still swollen and is still uncomfortable.  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 Yes NPRS scale: 3/10  Pain location:   Right axilla Pain orientation: Right  PAIN TYPE: aching, burning, sharp, and tight Pain description: constant  Aggravating factors: household chores,lifting,sleeping,  Relieving factors: pain meds  PRECAUTIONS: Bilateral Lymphedema risk, neuropathy  WEIGHT BEARING RESTRICTIONS No  FALLS:  Has patient fallen in last 6 months? 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  HAND DOMINANCE : right   PRIOR LEVEL OF FUNCTION: Independent  PATIENT GOALS stop the pain,be able to reach without pain, be able to clean   OBJECTIVE  COGNITION:  Overall cognitive status: Within functional limits for tasks assessed   PALPATION: Very tender throughout bilateral UQ  OBSERVATIONS / OTHER ASSESSMENTS: Both breast with generalized swelling and enlarged pores  SENSATION:  Light  touch: Deficits at lateral breast, medial arm inact     POSTURE: Forward head, rounded shoulders  UPPER EXTREMITY AROM/PROM:  A/PROM RIGHT   eval  Right 03/25/2022 Right 03/31/2022 Right 05/05/2022  Shoulder extension 42  48   Shoulder flexion 110 pulls under arm 148 150 159  Shoulder abduction 103 128 155 178  Shoulder internal rotation 55  60   Shoulder external rotation 79  93     (Blank rows = not tested)  A/PROM LEFT   eval Left 03/25/2022 Left 03/31/2022 Left  05/05/2022  Shoulder extension 52  52   Shoulder flexion 120 147 159 159  Shoulder abduction 115 154 168 178  Shoulder internal rotation 55  60   Shoulder external rotation 80  91     (Blank rows = not tested)   CERVICAL AROM: All within functional limits:     UPPER EXTREMITY STRENGTH: NT due to pain  LOWER EXTREMITY MMT: 4+/5  LYMPHEDEMA ASSESSMENTS:   SURGERY TYPE/DATE: 07/09/2021 BILATERAL BREAST LUMPECTOMY WITH RADIOACTIVE SEED LOCALIZATION RADIOACTIVE SEED GUIDED RIGHT AXILLARY TARGETED LYMPH NODE DISSECTION LEFT AXILLARY SENTINEL NODE BIOPSY LEFT AXILLARY LYMPH NODE MAPPING WITH SENTIMAG  NUMBER OF LYMPH NODES REMOVED: Right 1/1, Left 0/1  CHEMOTHERAPY: neoadjuvant  RADIATION:yes bilaterally  HORMONE TREATMENT: yes  INFECTIONS: no  LYMPHEDEMA ASSESSMENTS:   LANDMARK RIGHT  eval  10 cm proximal to olecranon process 30.5  Olecranon process 27.1  10 cm proximal to ulnar styloid process 21.2  Just proximal to ulnar styloid process 16.6  Across hand at thumb web space  21.4  At base of 2nd digit 6.8  (Blank rows = not tested)  LANDMARK LEFT  eval  10 cm proximal to olecranon process 30.7  Olecranon process 26.7  10 cm proximal to ulnar styloid process 20.5  Just proximal to ulnar styloid process 16.3  Across hand at thumb web space 21.1  At base of 2nd digit 6.7  (Blank rows = not tested)   FUNCTIONAL TESTS:   SLS ; 2-3 seconds ea leg GAIT: Distance walked: 20 Assistive device utilized: None Level of assistance: Complete Independence     QUICK DASH SURVEY: 77%,  03/31/2022: 31.82 BREAST COMPLAINTS SURVEY  :75  TODAY'S TREATMENT  05/10/2022 Pulleys x 2 min flexion and abduction, 1 min scaption, ball rolls forward on wall x 10  Scapular Retraction with Resistance  10 reps-  - Scapular Retraction with Resistance Advanced  10 reps-  - Shoulder External Rotation and Scapular Retraction with Resistance10 reps [id number] performed short neck, 5 diaphragmatic breaths, activated right axillary and inguinal LN's, right axillo-inguinal pathway and right breast lateral to pathway, then medial right breast to lateral and pathway in supine and Left SL ending with LN's. Emphasis on axilla and medial breast      05/05/2022 Pulleys x 2 min flexion and abduction  [id number]:[id number] performed short neck, 5 diaphragmatic breaths, activated right axillary and inguinal LN's, right axillo-inguinal pathway and right breast lateral to pathway, then medial right breast to lateral and pathway ending with LN's. Emphasis on axilla and medial breast   04/29/2022  IBB:[id number] performed short neck, 5 diaphragmatic breaths, activated right axillary and inguinal LN's, right axillo-inguinal pathway and right breast lateral to pathway, then medial right breast to lateral and pathway ending with LN's. Therapist then did the same steps but on the left breast. PROM bilateral shoulders flex, scaption ,abd MFR to right axillary area of cording AROM bilateral shoulder  flexion, scaption, horizontal abd.    04/22/2022  Pulleys x 2 min flexion and abduction MFR to right axillary cording was performed with pts arm in Flexion and slight ER Brief PROM right shoulder [id number] performed short neck, 5 diaphragmatic breaths, activated right axillary and inguinal LN's, right axillo-inguinal pathway and right breast lateral to pathway, then medial right breast to lateral and pathway ending with LN's  Pt wears TG soft for comfort when not at work. 04/20/2022  TG soft cut for right UE to decrease discomfort from cording; Advised to remove if any discomofrt or hand swelling Pt observed with increased swelling right axillary region with continued swelling at bilateral breast. Therapist performed short neck, 5 diaphragmatic breaths, activated right axillary and inguinal LN's, right axillo-inguinal pathway and right breast lateral to pathway, then medial right breast to lateral and pathway. Pt/pts fiance was instructed in short neck, breaths, LN activation and pathway. Pt was found to have several painful cords in the right axillary region. MFR was performed to cords with arm in scaption. MFR to right axillary cording was performed with pts arm in Flexion and slight ER Pt assisted on with her bra 04/12/2022 Pt observed with increased swelling right axillary region with continued swelling at bilateral breast. Adjusted pts bra up to the last notch to try and put compression more under the arm. Therapist performed short neck, 5 diaphragmatic breaths, activated right axillary and inguinal LN's, right axillo-inguinal pathway and right breast lateral to pathway, then medial right breast to lateral and pathway. Pt was instructed in short neck, breaths, LN activation and pathway and was given written handout. Pt was found to have several painful cords in the right axillary region. MFR was performed to cords with arm in scaption. PROM with MFR was performed for right shoulder  flexion and abd.  03/31/2022 Pulleys x 1:30 flex, scaption, abduction Supine wand flexion and scaption x 5 ea, star gazer x 5 Measured bilateral shoulder ROM and checked goals Made spaghetti foam pads for medial side of bilateral breast where enlarged pores and fibrosis are present.  03/24/2022  Pulleys x 1:30 flexion, scaption, abd with VC's for proper technique, Ball rolls up wall x 10 Soft tissue mobilization to bilateral pectorals, UT, and lateral trunk with cocoa butter in supine PROM bilateral shoulders with VC's to relax arm Measured AROM   03/18/2022 Assessed bilateral breasts; medial breasts still with very enlarged pores. Instructed pt in 4 post op exercises to improve ROM with supine clasped hands flexion and star gazer x 5 ea., supine wand flexion and scaption x 5, standing wall slides and scapular retraction x 5 ea, Lat counter stretch x 5 Soft tissue mobilization to bilateral pectorals, UT, and lateral trunk with cocoa butter in supine    03/04/2022 Pt given script for compression bra and information to Purchase at second to [place]. Discussed POC and what she can expect with therapy.  PATIENT EDUCATION:  Education details: POC, Flexi touch, compression bra and gave script. (Pt in agreement with sending demo to [facility]), supine wand, Access Code: [email or web address] G URL: [email or web address] Date: 05/10/2022 Prepared by: Cheral Almas  Exercises - Scapular Retraction with Resistance  - 1 x daily - 7 x weekly - 1 sets - 10 reps - Scapular Retraction with Resistance Advanced  - 1 x daily - 7 x weekly - 1 sets - 10 reps - Shoulder External Rotation and Scapular Retraction with Resistance  - 1 x daily - 7 x weekly - 1 sets - 10 reps Person educated: Patient Education method: Explanation Education comprehension:  verbalized understanding   HOME EXERCISE PROGRAM: Eval :None given today due to time  ASSESSMENT:  CLINICAL IMPRESSION: Pt required occasional  VC's initially with yellow band exercises but was able to correct herself .  Her right breast with more fibrosis laterally today and continued enlarged pores medially, but less swelling in axilla today. Pt always feels better after MLD. OBJECTIVE IMPAIRMENTS decreased activity tolerance, decreased balance, decreased knowledge of condition, difficulty walking, decreased ROM, increased edema, increased fascial restrictions, impaired sensation, impaired UE functional use, postural dysfunction, and pain.   ACTIVITY LIMITATIONS carrying, lifting, sleeping, bathing, dressing, and reach over head  PARTICIPATION LIMITATIONS: cleaning, occupation, and yard work  PERSONAL FACTORS 3+ comorbidities: Bilateral breast Cancer with chemo/radiation, pain management,  are also affecting patient's functional outcome.   REHAB POTENTIAL: Good  CLINICAL DECISION MAKING: Stable/uncomplicated  EVALUATION COMPLEXITY: Low  GOALS: Goals reviewed with patient? Yes  SHORT TERM GOALS: Target date: 05/31/2022    Pt will be independent and compliant with a HEP for shoulder ROM and strength Baseline: Goal status: MET 2.  Pt will have improved bilateral shoulder flexion and abd to atleast 135 Baseline:  Goal status: MET 03/31/2022  3. Pt will have Flexi touch trial for breast lymphedema In Progress:MET  LONG TERM GOALS: Target date: 06/21/2022    Pt will have bilateral shoulder flexion atleast 145 degrees and abduction atleast 160 degrees for improved reaching ability Baseline:  Goal status: MET left 03/31/2022, progressing Right MET Right 05/05/2022  2.  Pts quick dash will be improved to no greater than 25% for improved function Baseline: 33% today Goal status: in PROGRESS 3.  Pt will be independent in self MLD to decrease bilateral breast swelling Baseline:  Goal status: In Progress  4.  Pt will purchase a compression bra to decrease breast swelling Baseline:  Goal status: MET 5.  Pt will be able to  dress, bathe and perform reaching activities with minimal pain. Baseline:  Goal status: MET 05/05/2022     PLAN: PT FREQUENCY: 2x/week  PT DURATION:  6 weeks  PLANNED INTERVENTIONS: Therapeutic exercises, Therapeutic activity, Neuromuscular re-education, Balance training, Patient/Family education, Self Care, Joint mobilization, Orthotic/Fit training, Manual lymph drainage, scar mobilization, Vasopneumatic device, Manual therapy, and Re-evaluation  PLAN FOR NEXT SESSION:Recert next, pt to wear compression bra; how did spaghetti  foam medially in bra do? , initiate and instruct Bilateral breast MLD.still awaiting Flexitouch appropval, ROM and STM prn.    Claris Pong, PT 05/10/2022, 1:13 PM

## 2022-05-11 ENCOUNTER — Encounter: Payer: Self-pay | Admitting: Student in an Organized Health Care Education/Training Program

## 2022-05-11 ENCOUNTER — Other Ambulatory Visit (HOSPITAL_COMMUNITY): Payer: Self-pay

## 2022-05-11 ENCOUNTER — Ambulatory Visit (HOSPITAL_COMMUNITY)
Admission: RE | Admit: 2022-05-11 | Discharge: 2022-05-11 | Disposition: A | Discharge status: Home / Self Care | Payer: Medicaid Other | Source: Ambulatory Visit | Attending: Adult Health | Admitting: Adult Health

## 2022-05-11 ENCOUNTER — Ambulatory Visit
Payer: Medicaid Other | Attending: Student in an Organized Health Care Education/Training Program | Admitting: Student in an Organized Health Care Education/Training Program

## 2022-05-11 VITALS — BP 188/103 | HR 69 | Temp 97.3°F | Resp 16 | Ht 71.0 in | Wt 218.0 lb

## 2022-05-11 DIAGNOSIS — E669 Obesity, unspecified: Secondary | ICD-10-CM | POA: Diagnosis present

## 2022-05-11 DIAGNOSIS — C50412 Malignant neoplasm of upper-outer quadrant of left female breast: Secondary | ICD-10-CM | POA: Insufficient documentation

## 2022-05-11 DIAGNOSIS — G894 Chronic pain syndrome: Secondary | ICD-10-CM | POA: Insufficient documentation

## 2022-05-11 DIAGNOSIS — Z170 Estrogen receptor positive status [ER+]: Secondary | ICD-10-CM | POA: Insufficient documentation

## 2022-05-11 DIAGNOSIS — C50411 Malignant neoplasm of upper-outer quadrant of right female breast: Secondary | ICD-10-CM | POA: Insufficient documentation

## 2022-05-11 DIAGNOSIS — Z0289 Encounter for other administrative examinations: Secondary | ICD-10-CM | POA: Insufficient documentation

## 2022-05-11 LAB — POCT I-STAT CREATININE: Creatinine, Ser: 0.9 mg/dL (ref 0.44–1.00)

## 2022-05-11 MED ORDER — OXYCODONE-ACETAMINOPHEN 5-325 MG PO TABS: 1.0000 | ORAL_TABLET | Freq: Four times a day (QID) | ORAL | 0 refills | Status: DC | PRN

## 2022-05-11 MED ORDER — SODIUM CHLORIDE (PF) 0.9 % IJ SOLN
INTRAMUSCULAR | Status: AC
  Filled 2022-05-11: qty 50

## 2022-05-11 MED ORDER — IOHEXOL 300 MG/ML  SOLN
75.0000 mL | Freq: Once | INTRAMUSCULAR | Status: AC | PRN
  Administered 2022-05-11: 75 mL via INTRAVENOUS

## 2022-05-11 NOTE — Progress Notes (Signed)
PROVIDER NOTE: Information contained herein reflects review and annotations entered in association with encounter. Interpretation of such information and data should be left to medically-trained personnel. Information provided to patient can be located elsewhere in the medical record under "Patient Instructions". Document created using STT-dictation technology, any transcriptional errors that may result from process are unintentional.    Patient: Chelsea Hansen  Service Category: E/M  Provider: Gillis Santa, MD  DOB: 1970-12-07  DOS: 05/11/2022  Specialty: Interventional Pain Management  MRN: [id number]  Setting: Ambulatory outpatient  PCP: [name], NP  Type: Established Patient    Referring Provider: [name], NP  Location: Office  Delivery: Face-to-face     HPI  Chelsea Hansen, a 51 y.o. year old female, is here today because of her Pain management contract signed [Z02.89]. Chelsea Hansen primary complain today is Breast Pain (right) Last encounter: My last encounter with her was on 11/19/21 Pertinent problems: Chelsea Hansen has Malignant neoplasm of upper-outer quadrant of right breast in female, estrogen receptor positive ([place]); Malignant neoplasm of upper-outer quadrant of left breast in female, estrogen receptor positive ([place]); Obesity (BMI 30-39.9); Pain management contract signed; and Chronic pain syndrome (breast cancer) on their pertinent problem list. Pain Assessment: Severity of Chronic pain is reported as a 10-Worst pain ever/10. Location: Breast Right, Left/radiates down arm to hand. Onset: More than a month ago. Quality: Aching, Burning, Pressure, Stabbing, Tender, Crying, Discomfort. Timing: Constant. Modifying factor(s): meds, rest, compression bra. Vitals:  height is _0  (1.803 m) and weight is 218 lb (98.9 kg). Her temperature is 97.3 F (36.3 C) (abnormal). Her blood pressure is 188/103 (abnormal) and her pulse is 69. Her respiration is 16 and oxygen saturation is  100%.   Reason for encounter: medication management.    -Patient is status post bilateral breast lumpectomy for breast cancer done on 07/09/2021.  -She is endorsing increased right axilla and breast pain.  She has swelling of her right breast.  She is scheduled for a CT scan as well as a bone density scan. -Is taking oxycodone  5 mg every 8 hours as needed but states that it is not covering her pain and she is requesting to take every 6 hours as needed especially since she is working with physical therapy twice a week -UDS up-to-date and appropriate  Pharmacotherapy Assessment  Analgesic:  Percocet 5 mg every 6 hours as needed will consider decreasing back to every 8 hours after the new year once we have results of her CT scan, bone density scan and cancer treatment plan.  Monitoring: [place] PMP: PDMP reviewed during this encounter.       Pharmacotherapy: No side-effects or adverse reactions reported. Compliance: No problems identified. Effectiveness: Clinically acceptable.  Ignatius Specking, RN  05/11/2022  1:46 PM  Sign when Signing Visit Nursing Pain Medication Assessment:  Safety precautions to be maintained throughout the outpatient stay will include: orient to surroundings, keep bed in low position, maintain call bell within reach at all times, provide assistance with transfer out of bed and ambulation.  Medication Inspection Compliance: Pill count conducted under aseptic conditions, in front of the patient. Neither the pills nor the bottle was removed from the patient's sight at any time. Once count was completed pills were immediately returned to the patient in their original bottle.  Medication: See above Pill/Patch Count:  33 of 90 pills remain Pill/Patch Appearance: Markings consistent with prescribed medication Bottle Appearance: Standard pharmacy container. Clearly labeled. Filled Date:  11 / 3 / 2023 Last Medication intake:  Today     UDS:  Summary  Date Value Ref Range  Status  02/17/2022 Note  Final    Comment:    ==================================================================== [employer] [facility] 13 (MW) ==================================================================== Test                             Result       Flag       Units  Drug Present and Declared for Prescription Verification   Oxycodone                      415          EXPECTED   ng/mg creat   Noroxycodone                   237          EXPECTED   ng/mg creat    Sources of oxycodone include scheduled prescription medications.    Noroxycodone is an expected metabolite of oxycodone.  ==================================================================== Test                      Result    Flag   Units      Ref Range   Creatinine              62               mg/dL      >=20 ==================================================================== Declared Medications:  The flagging and interpretation on this report are based on the  following declared medications.  Unexpected results may arise from  inaccuracies in the declared medications.   **Note: The testing scope of this panel includes these medications:   Oxycodone (Percocet)   **Note: The testing scope of this panel does not include the  following reported medications:   Acetaminophen (Percocet)  Albuterol (Ventolin HFA)  Amlodipine (Norvasc)  Aripiprazole (Abilify)  Escitalopram (Lexapro)  Hydrochlorothiazide (Hydrodiuril)  Letrozole (Femara)  Montelukast (Singulair)  Propranolol (Inderal)  Valsartan (Diovan) ==================================================================== For clinical consultation, please call [phone]. ====================================================================      ROS  Constitutional:  Right breast pain, right axillary edema Gastrointestinal: No reported hemesis, hematochezia, vomiting, or acute GI distress Musculoskeletal:  Diffuse whole body pain, low back bilateral hip  pain Neurological: No reported episodes of acute onset apraxia, aphasia, dysarthria, agnosia, amnesia, paralysis, loss of coordination, or loss of consciousness  Medication Review  ARIPiprazole, albuterol, amLODipine, anastrozole, escitalopram, hydrochlorothiazide, letrozole, montelukast, oxyCODONE-acetaminophen, propranolol, and valsartan  History Review  Allergy: Chelsea Hansen has No Known Allergies. Drug: Chelsea Hansen  reports no history of drug use. Alcohol:  reports that she does not currently use alcohol after a past usage of about 7.0 standard drinks of alcohol per week. Tobacco:  reports that she has never smoked. She has never used smokeless tobacco. Social: Chelsea Hansen  reports that she has never smoked. She has never used smokeless tobacco. She reports that she does not currently use alcohol after a past usage of about 7.0 standard drinks of alcohol per week. She reports that she does not use drugs. Medical:  has a past medical history of Allergy, Anxiety, Asthma, Breast cancer ([place]) (2012), Cancer ([place]) (2022), Depression, DVT (deep venous thrombosis) ([place]) (2010), Family history of uterine cancer, GERD (gastroesophageal reflux disease), Headache(784.0), Hyperlipidemia, Hypertension, and SVT (supraventricular tachycardia). Surgical: Chelsea Hansen  has a past surgical history that includes  Abdominal hysterectomy; Breast surgery; Tubal ligation; Breast excisional biopsy (Right, 09/16/2009); Portacath placement (Left, 01/26/2021); Breast lumpectomy with radioactive seed localization (Bilateral, 07/09/2021); Radioactive seed guided axillary sentinel lymph node (Right, 07/09/2021); Axillary sentinel node biopsy (Left, 07/09/2021); and Port-a-cath removal (Left, 07/09/2021). Family: family history includes Breast cancer in her maternal aunt and mother; Breast cancer (age of onset: 45) in her cousin; Hypertension in her father; Ovarian cancer in her maternal aunt; Uterine cancer in her maternal aunt; Uterine cancer (age  of onset: 48) in her cousin.  Laboratory Chemistry Profile   Renal Lab Results  Component Value Date   BUN 17 02/19/2022   CREATININE 0.74 02/19/2022   BCR 15 10/06/2020   GFRAA >60 09/07/2019   GFRNONAA >60 02/19/2022    Hepatic Lab Results  Component Value Date   AST 16 02/19/2022   ALT 15 02/19/2022   ALBUMIN 4.2 02/19/2022   ALKPHOS 104 02/19/2022   LIPASE 21 01/28/2021    Electrolytes Lab Results  Component Value Date   NA 139 02/19/2022   K 3.7 02/19/2022   CL 106 02/19/2022   CALCIUM 9.7 02/19/2022   MG 2.0 01/28/2021    Bone No results found for: "VD25OH", "VD125OH2TOT", "JJ9417EY8", "XK4818HU3", "25OHVITD1", "25OHVITD2", "25OHVITD3", "TESTOFREE", "TESTOSTERONE"  Inflammation (CRP: Acute Phase) (ESR: Chronic Phase) Lab Results  Component Value Date   ESRSEDRATE 11 12/31/2011   LATICACIDVEN 0.87 11/01/2013         Note: Above Lab results reviewed.  Recent Imaging Review  [place] AXILLARY NODE CORE BIOPSY RIGHT Addendum: ADDENDUM REPORT: 04/27/2022 14:09   ADDENDUM:  Pathology revealed MATURE ADIPOSE TISSUE CONSISTENT WITH THE  CLINICAL IMPRESSION OF LIPOMA (NO LYMPHOID TISSUE PRESENT IN THE  BIOPSY SPECIMEN) of RIGHT axilla. This was found to be concordant by  Dr. Kristopher Oppenheim.   Pathology results were discussed with the patient by telephone. The  patient reported doing well after the biopsy with tenderness at the  site. Post biopsy instructions and care were reviewed and questions  were answered. The patient was encouraged to call [facility] for any additional concerns.   Recommendation:1- Clinical follow up for right axillary swelling. 2-  The patient was instructed to return for bilateral annual diagnostic  mammography due August 2024.   Pathology results reported by Stacie Acres RN on 04/27/2022.   Electronically Signed    By: Kristopher Oppenheim M.D.    On: 04/27/2022 14:09 Narrative: CLINICAL DATA:  51 year old female  with an indeterminate right axillary mass.  EXAM: ULTRASOUND-GUIDED AXILLARY CORE BIOPSY RIGHT  COMPARISON:  Previous exam(s).  PROCEDURE: I met with the patient and we discussed the procedure of ultrasound-guided biopsy, including benefits and alternatives. We discussed the high likelihood of a successful procedure. We discussed the risks of the procedure, including infection, bleeding, tissue injury, clip migration, and inadequate sampling. Informed written consent was given. The usual time-out protocol was performed immediately prior to the procedure.  Using sterile technique and 1% Lidocaine as local anesthetic, under direct ultrasound visualization, a 14 gauge spring-loaded device was used to perform biopsy of a mass in the right axilla using a lateral approach. At the conclusion of the procedure a HydroMARK tissue marker clip was deployed into the biopsy cavity. Follow up 2 view mammogram was deferred at this time.  IMPRESSION: Ultrasound guided biopsy of the right axilla. No apparent complications.  Electronically Signed: By: Kristopher Oppenheim M.D. On: 04/26/2022 14:32 Note: Reviewed        Physical  Exam  General appearance: Well nourished, well developed, and well hydrated. In no apparent acute distress Mental status: Alert, oriented x 3 (person, place, & time)       Respiratory: No evidence of acute respiratory distress Eyes: PERLA Vitals: BP (!) 188/103   Pulse 69   Temp (!) 97.3 F (36.3 C)   Resp 16   Ht _0  (1.803 m)   Wt 218 lb (98.9 kg)   SpO2 100%   BMI 30.40 kg/m  BMI: Estimated body mass index is 30.4 kg/m as calculated from the following:   Height as of this encounter: _1  (1.803 m).   Weight as of this encounter: 218 lb (98.9 kg). Ideal: Ideal body weight: 70.8 kg (156 lb 1.4 oz) Adjusted ideal body weight: 82 kg (180 lb 13.6 oz)  Diffuse myalgias and arthralgias all over  Assessment   Status Diagnosis   Controlled Controlled Controlled 1. Pain management contract signed   2. Chronic pain syndrome (breast cancer)   3. Malignant neoplasm of upper-outer quadrant of right breast in female, estrogen receptor positive ([place])   4. Malignant neoplasm of upper-outer quadrant of left breast in female, estrogen receptor positive ([place])   5. Obesity (BMI 30-39.9)       Plan of Care    Chelsea Hansen has a current medication list which includes the following long-term medication(s): albuterol, amlodipine, aripiprazole, escitalopram, hydrochlorothiazide, montelukast, propranolol, and valsartan.  Pharmacotherapy (Medications Ordered): Meds ordered this encounter  Medications   oxyCODONE-acetaminophen (PERCOCET) 5-325 MG tablet    Sig: Take 1 tablet by mouth every 6 (six) hours as needed for severe pain.    Dispense:  120 tablet    Refill:  0    Chronic Pain: STOP Act (Not applicable) Fill 1 day early if closed on refill date. Avoid benzodiazepines within 8 hours of opioids   oxyCODONE-acetaminophen (PERCOCET) 5-325 MG tablet    Sig: Take 1 tablet by mouth every 6 (six) hours as needed for severe pain.    Dispense:  120 tablet    Refill:  0    Chronic Pain: STOP Act (Not applicable) Fill 1 day early if closed on refill date. Avoid benzodiazepines within 8 hours of opioids   oxyCODONE-acetaminophen (PERCOCET) 5-325 MG tablet    Sig: Take 1 tablet by mouth every 6 (six) hours as needed for severe pain.    Dispense:  120 tablet    Refill:  0    Chronic Pain: STOP Act (Not applicable) Fill 1 day early if closed on refill date. Avoid benzodiazepines within 8 hours of opioids   No orders of the defined types were placed in this encounter.    Patient discontinued gabapentin as she did not find it effective. Dulcolax for bowel management   Follow-up plan:   Return in about 14 weeks (around 08/17/2022) for Medication Management, in person.    Recent Visits Date Type Provider Dept  02/11/22  Office Visit Gillis Santa, MD [facility]  Showing recent visits within past 90 days and meeting all other requirements Today's Visits Date Type Provider Dept  05/11/22 Office Visit Gillis Santa, MD [facility]  Showing today's visits and meeting all other requirements Future Appointments No visits were found meeting these conditions. Showing future appointments within next 90 days and meeting all other requirements  I discussed the assessment and treatment plan with the patient. The patient was provided an opportunity to ask questions and all were answered. The patient agreed  with the plan and demonstrated an understanding of the instructions.  Patient advised to call back or seek an in-person evaluation if the symptoms or condition worsens.  Duration of encounter: 84mnutes.  Note by: BGillis Santa MD Date: 05/11/2022; Time: 2:05 PM

## 2022-05-11 NOTE — Progress Notes (Signed)
Nursing Pain Medication Assessment:  Safety precautions to be maintained throughout the outpatient stay will include: orient to surroundings, keep bed in low position, maintain call bell within reach at all times, provide assistance with transfer out of bed and ambulation.  Medication Inspection Compliance: Pill count conducted under aseptic conditions, in front of the patient. Neither the pills nor the bottle was removed from the patient's sight at any time. Once count was completed pills were immediately returned to the patient in their original bottle.  Medication: See above Pill/Patch Count:  33 of 90 pills remain Pill/Patch Appearance: Markings consistent with prescribed medication Bottle Appearance: Standard pharmacy container. Clearly labeled. Filled Date: 25 / 3 / 2023 Last Medication intake:  Today

## 2022-05-12 ENCOUNTER — Ambulatory Visit: Payer: Medicaid Other

## 2022-05-14 ENCOUNTER — Other Ambulatory Visit (HOSPITAL_COMMUNITY): Payer: Self-pay

## 2022-05-14 NOTE — Progress Notes (Signed)
Patient Care Team: [name], NP as PCP - General (Internal Medicine) [name], DO as PCP - Cardiology (Cardiology) [name], MD as Consulting Physician (Radiation Oncology) [name], MD as Consulting Physician (General Surgery) [name], MD as Consulting Physician (Hematology and Oncology)  DIAGNOSIS:  Encounter Diagnosis  Name Primary?   Malignant neoplasm of upper-outer quadrant of right breast in female, estrogen receptor positive ([place]) Yes    SUMMARY OF ONCOLOGIC HISTORY: Oncology History  Malignant neoplasm of upper-outer quadrant of right breast in female, estrogen receptor positive ([place])  12/12/2020 Initial Diagnosis   status post bilateral breast biopsies 12/03/2020, showing             (1) on the right, a clinical T2 N1, stage IIa invasive lobular carcinoma, grade 2, estrogen and progesterone receptor strongly positive, HER2 not amplified, with an MIB-1 of 40%                         (a) the biopsied right axillary lymph node was positive with extracapsular extension                         (b) a second right breast mass also biopsied was a fibroadenoma, concordant  MAMMAPRINT tested on biopsy returned high risk, luminal type B, indicating significant benefit from chemo                         (c) biopsy of an area of non-mass-like enhancement in the upper right breast pending   12/17/2020 Cancer Staging   Staging form: Breast, AJCC 8th Edition - Clinical stage from 12/17/2020: Stage IIA (cT2, cN1, cM0, G2, ER+, PR+, HER2-) - Signed by [name], NP on 01/14/2022 Stage prefix: Initial diagnosis Histologic grading system: 3 grade system Laterality: Right Staged by: Pathologist and managing physician Stage used in treatment planning: Yes National guidelines used in treatment planning: Yes Type of national guideline used in treatment planning: NCCN   12/24/2020 Genetic Testing   Negative genetic testing:  No pathogenic variants  detected on the [employer] BRCAplus panel (report date 12/24/2020) or the CancerNext-Expanded + RNAinsight panel (report date 12/31/2020). 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   The BRCAplus panel offered by [employer] and includes sequencing and deletion/duplication analysis for the following 8 genes: ATM, BRCA1, BRCA2, CDH1, CHEK2, PALB2, PTEN, and TP53. The CancerNext-Expanded + RNAinsight gene panel offered by [employer] and includes sequencing and rearrangement analysis for the following 77 genes: AIP, ALK, APC, ATM, AXIN2, BAP1, BARD1, BLM, BMPR1A, BRCA1, BRCA2, BRIP1, CDC73, CDH1, CDK4, CDKN1B, CDKN2A, CHEK2, CTNNA1, DICER1, FANCC, FH, FLCN, GALNT12, KIF1B, LZTR1, MAX, MEN1, MET, MLH1, MSH2, MSH3, MSH6, MUTYH, NBN, NF1, NF2, NTHL1, PALB2, PHOX2B, PMS2, POT1, PRKAR1A, PTCH1, PTEN, RAD51C, RAD51D, RB1, RECQL, RET, SDHA, SDHAF2, SDHB, SDHC, SDHD, SMAD4, SMARCA4, SMARCB1, SMARCE1, STK11, SUFU, TMEM127, TP53, TSC1, TSC2, VHL and XRCC2 (sequencing and deletion/duplication); EGFR, EGLN1, HOXB13, KIT, MITF, PDGFRA, POLD1 and POLE (sequencing only); EPCAM and GREM1 (deletion/duplication only). RNA data is routinely analyzed for use in variant interpretation for all genes.   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, margins negative, 0/1 lymph node negative ER 95%, PR 100%   07/09/2021 Cancer Staging   Staging form: Breast, AJCC 8th Edition - Pathologic stage from 07/09/2021: No Stage Recommended (ypT2, pN1a, cM0, G2) - Signed by [name], NP on 07/22/2021 Stage prefix: Post-therapy Histologic grading system: 3 grade system   07/2021 -  Radiation Therapy   Adjuvant radiation to follow surgery   11/2021 -  Anti-estrogen oral therapy   Letrozole x 5-7 years; changed to anastrozole   Malignant neoplasm of upper-outer quadrant of left breast in female, estrogen receptor positive ([place])  12/12/2020 Initial Diagnosis   on the left, a clinical T1b N0, stage IA invasive ductal carcinoma, grade 1 or 2, estrogen and progesterone receptor positive, HER2 not amplified, with an MIB 1 of 15%   12/17/2020 Cancer Staging   Staging form: Breast, AJCC 8th Edition - Clinical stage from 12/17/2020: Stage IA (cT1b, cN0, cM0, G2, ER+, PR+, HER2-) - Signed by [name], NP on 01/14/2022 Stage prefix: Initial diagnosis Histologic grading system: 3 grade system Laterality: Left Staged by: Pathologist and managing physician Stage used in treatment planning: Yes National guidelines used in treatment planning: Yes Type of national guideline used in treatment planning: NCCN   12/24/2020 Genetic Testing   Negative genetic testing:  No pathogenic variants detected on the [facility] BRCAplus panel (report date 12/24/2020) or the CancerNext-Expanded + RNAinsight panel (report date 12/31/2020). 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   The BRCAplus panel offered by [employer] and includes sequencing and deletion/duplication analysis for the following 8 genes: ATM, BRCA1, BRCA2, CDH1, CHEK2, PALB2, PTEN, and TP53. The CancerNext-Expanded + RNAinsight gene panel offered by [employer] and includes sequencing and rearrangement analysis for the following 77 genes: AIP, ALK, APC, ATM, AXIN2, BAP1, BARD1, BLM, BMPR1A, BRCA1, BRCA2, BRIP1, CDC73, CDH1, CDK4, CDKN1B, CDKN2A, CHEK2, CTNNA1, DICER1, FANCC, FH, FLCN, GALNT12, KIF1B, LZTR1, MAX, MEN1, MET, MLH1, MSH2, MSH3, MSH6, MUTYH, NBN,  NF1, NF2, NTHL1, PALB2, PHOX2B, PMS2, POT1, PRKAR1A, PTCH1, PTEN, RAD51C, RAD51D, RB1, RECQL, RET, SDHA, SDHAF2, SDHB, SDHC, SDHD, SMAD4, SMARCA4, SMARCB1, SMARCE1, STK11, SUFU, TMEM127, TP53, TSC1, TSC2, VHL and XRCC2 (sequencing and deletion/duplication); EGFR, EGLN1, HOXB13, KIT, MITF, PDGFRA, POLD1 and POLE (sequencing only); EPCAM and GREM1 (deletion/duplication only). RNA data is routinely analyzed for use in variant interpretation for all genes.   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 with a history of right breast cancer. Currently on anastrozole therapy. She presents to the clinic for a follow-up to review scans. She states that she still have hot flashes and joint stiffness and aches. Her major complaint today is mid back pain which is quite severe and intense.  Has been going through pain physician for this.   ALLERGIES:  has No Known Allergies.  MEDICATIONS:  Current Outpatient Medications  Medication Sig Dispense Refill    [START ON 05/25/2022] abemaciclib (VERZENIO) 100 MG tablet Take 1 tablet (100 mg total) by mouth 2 (two) times daily. 60 tablet 3   albuterol (VENTOLIN HFA) 108 (90 Base) MCG/ACT inhaler Inhale 2 puffs by mouth into the lungs every 6 hours as needed for wheezing or shortness of breath. 18 g 2   amLODipine (NORVASC) 10 MG tablet Take 1 tablet  by mouth daily. 90 tablet 1   anastrozole (ARIMIDEX) 1 MG tablet Take 1 tablet (1 mg total) by mouth daily. 90 tablet 3   ARIPiprazole (ABILIFY) 15 MG tablet Take 1 tablet by mouth at bedtime 30 tablet 0   escitalopram (LEXAPRO) 20 MG tablet Take 1 tablet (20 mg total) by mouth daily. 90 tablet 1   hydrochlorothiazide (HYDRODIURIL) 25 MG tablet Take 1 tablet by mouth daily. 30 tablet 0   letrozole (FEMARA) 2.5 MG tablet Take 1 tablet (2.5 mg total) by mouth daily. 90 tablet 3   montelukast (SINGULAIR) 10 MG tablet Take 1 tablet (10 mg total) by mouth at bedtime. 90 tablet 3   [START ON 05/21/2022] oxyCODONE-acetaminophen (PERCOCET) 5-325 MG tablet Take 1 tablet by mouth every 6 (six) hours as needed for severe pain. (05/21/22) 120 tablet 0   [START ON 06/20/2022] oxyCODONE-acetaminophen (PERCOCET) 5-325 MG tablet Take 1 tablet by mouth every 6 (six) hours as needed for severe pain. (06/20/22) 120 tablet 0   [START ON 07/20/2022] oxyCODONE-acetaminophen (PERCOCET) 5-325 MG tablet Take 1 tablet by mouth every 6 (six) hours as needed for severe pain. (07/20/22) 120 tablet 0   propranolol (INDERAL) 10 MG tablet Take 1 tablet (10 mg total) by mouth 2 (two) times daily. 180 tablet 3   valsartan (DIOVAN) 40 MG tablet Take 1 tablet (40 mg total) by mouth daily. 90 tablet 3   No current facility-administered medications for this visit.   Facility-Administered Medications Ordered in Other Visits  Medication Dose Route Frequency Provider Last Rate Last Admin   acetaminophen (TYLENOL) 500 MG tablet            ondansetron (ZOFRAN) 4 MG/2ML injection            ondansetron  (ZOFRAN) 4 MG/2ML injection             PHYSICAL EXAMINATION: ECOG PERFORMANCE STATUS: 1 - Symptomatic but completely ambulatory  Vitals:   05/18/22 1330  BP: (!) 161/91  Pulse: 67  Resp: 18  Temp: 97.8 F (36.6 C)  SpO2: 100%   Filed Weights   05/18/22 1330  Weight: 219 lb 4.8 oz (99.5 kg)      LABORATORY DATA:  I have reviewed the data as listed    Latest Ref Rng & Units 05/18/2022    1:20 PM 05/11/2022    4:01 PM 02/19/2022    1:24 PM  CMP  Glucose 70 - 99 mg/dL 86   106   BUN 6 - 20 mg/dL 14   17   Creatinine 0.44 - 1.00 mg/dL 0.84  0.90  0.74  Sodium 135 - 145 mmol/L 139   139   Potassium 3.5 - 5.1 mmol/L 3.6   3.7   Chloride 98 - 111 mmol/L 104   106   CO2 22 - 32 mmol/L 30   29   Calcium 8.9 - 10.3 mg/dL 9.6   9.7   Total Protein 6.5 - 8.1 g/dL 7.7   7.3   Total Bilirubin 0.3 - 1.2 mg/dL 0.4   0.3   Alkaline Phos 38 - 126 U/L 100   104   AST 15 - 41 U/L 17   16   ALT 0 - 44 U/L 20   15     Lab Results  Component Value Date   WBC 5.9 05/18/2022   HGB 11.0 (L) 05/18/2022   HCT 34.3 (L) 05/18/2022   MCV 75.4 (L) 05/18/2022   PLT 252 05/18/2022   NEUTROABS 4.1 05/18/2022    ASSESSMENT & PLAN:  Malignant neoplasm of upper-outer quadrant of right breast in female, estrogen receptor positive ([place]) 12/03/2020: Bilateral breast biopsies: Right breast: T2N1 stage IIa grade 2 ILC, ER/PR positive, HER2 negative, Ki-67 40%, right axillary lymph node positive with extracapsular extension MammaPrint: High risk   01/27/2021 -06/08/2021 neoadjuvant chemotherapy with dose dense Adriamycin and Cytoxan x4 followed by Taxol x11   07/09/2021:Right lumpectomy: Grade 2 invasive lobular cancer, 2.5 cm, LCIS, margins negative, LCIS focally at anterior margin, 1/1 lymph node positive, ER 95%, PR 100%, HER2 negative, Ki-67 40% 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 Adjuvant radiation therapy  09/04/2021-10/30/2021 2. followed by adjuvant antiestrogen therapy with anastrozole started May 2023   Right chest wall pain: CT chest 05/13/2022: Multifocal bone metastases thoracic spine and scattered rib lesions, no nodal or visceral metastases.  Treatment plan: I discussed with the patient that the presence of bone metastases suggest metastatic breast cancer and therefore the treatment plan has been changed to include Verzinio along with anastrozole. Abemaciclib counseling: I discussed at length the risks and benefits of Abemaciclib in combination with letrozole. Adverse effects of Abemaciclib include decreasing neutrophil count, pneumonia, blood clots in lungs as well as nausea and GI symptoms. Side effects of letrozole include hot flashes, muscle aches and pains, uterine bleeding/spotting/cancer, osteoporosis, risk of blood clots.  Return to clinic in 3 weeks for appointment with Jenny Reichmann to assess tolerance of 100 p.o. twice daily of Verzinio. She will also need Xgeva for bone metastases.   Orders Placed This Encounter  Procedures   Miscellaneous [facility] test (send-out)   The patient has a good understanding of the overall plan. she agrees with it. she will call with any problems that may develop before the next visit here. Total time spent: 30 mins including face to face time and time spent for planning, charting and co-ordination of care   Harriette Ohara, MD 05/18/22    I Gardiner Coins am scribing for Dr. Lindi Adie  I have reviewed the above documentation for accuracy and completeness, and I agree with the above.

## 2022-05-17 ENCOUNTER — Other Ambulatory Visit: Payer: Self-pay | Admitting: *Deleted

## 2022-05-17 DIAGNOSIS — Z170 Estrogen receptor positive status [ER+]: Secondary | ICD-10-CM

## 2022-05-18 ENCOUNTER — Inpatient Hospital Stay: Payer: Medicaid Other

## 2022-05-18 ENCOUNTER — Ambulatory Visit: Payer: Medicaid Other

## 2022-05-18 ENCOUNTER — Inpatient Hospital Stay (HOSPITAL_BASED_OUTPATIENT_CLINIC_OR_DEPARTMENT_OTHER): Payer: Medicaid Other | Admitting: Hematology and Oncology

## 2022-05-18 ENCOUNTER — Other Ambulatory Visit: Payer: Self-pay | Admitting: *Deleted

## 2022-05-18 ENCOUNTER — Other Ambulatory Visit: Payer: Self-pay

## 2022-05-18 ENCOUNTER — Other Ambulatory Visit (HOSPITAL_COMMUNITY): Payer: Self-pay

## 2022-05-18 VITALS — BP 161/91 | HR 67 | Temp 97.8°F | Resp 18 | Ht 71.0 in | Wt 219.3 lb

## 2022-05-18 DIAGNOSIS — G894 Chronic pain syndrome: Secondary | ICD-10-CM | POA: Diagnosis not present

## 2022-05-18 DIAGNOSIS — Z170 Estrogen receptor positive status [ER+]: Secondary | ICD-10-CM | POA: Diagnosis not present

## 2022-05-18 DIAGNOSIS — L599 Disorder of the skin and subcutaneous tissue related to radiation, unspecified: Secondary | ICD-10-CM

## 2022-05-18 DIAGNOSIS — I890 Lymphedema, not elsewhere classified: Secondary | ICD-10-CM

## 2022-05-18 DIAGNOSIS — R293 Abnormal posture: Secondary | ICD-10-CM

## 2022-05-18 DIAGNOSIS — C50411 Malignant neoplasm of upper-outer quadrant of right female breast: Secondary | ICD-10-CM | POA: Diagnosis not present

## 2022-05-18 DIAGNOSIS — R209 Unspecified disturbances of skin sensation: Secondary | ICD-10-CM

## 2022-05-18 LAB — CMP (CANCER CENTER ONLY)
ALT: 20 U/L (ref 0–44)
AST: 17 U/L (ref 15–41)
Albumin: 4.3 g/dL (ref 3.5–5.0)
Alkaline Phosphatase: 100 U/L (ref 38–126)
Anion gap: 5 (ref 5–15)
BUN: 14 mg/dL (ref 6–20)
CO2: 30 mmol/L (ref 22–32)
Calcium: 9.6 mg/dL (ref 8.9–10.3)
Chloride: 104 mmol/L (ref 98–111)
Creatinine: 0.84 mg/dL (ref 0.44–1.00)
GFR, Estimated: >60 mL/min (ref >60)
Glucose, Bld: 86 mg/dL (ref 70–99)
Potassium: 3.6 mmol/L (ref 3.5–5.1)
Sodium: 139 mmol/L (ref 135–145)
Total Bilirubin: 0.4 mg/dL (ref 0.3–1.2)
Total Protein: 7.7 g/dL (ref 6.5–8.1)

## 2022-05-18 LAB — CBC WITH DIFFERENTIAL (CANCER CENTER ONLY)
Abs Immature Granulocytes: 0.02 10*3/uL (ref 0.00–0.07)
Basophils Absolute: 0 10*3/uL (ref 0.0–0.1)
Basophils Relative: 1 %
Eosinophils Absolute: 0.1 10*3/uL (ref 0.0–0.5)
Eosinophils Relative: 2 %
HCT: 34.3 % — ABNORMAL LOW (ref 36.0–46.0)
Hemoglobin: 11 g/dL — ABNORMAL LOW (ref 12.0–15.0)
Immature Granulocytes: 0 %
Lymphocytes Relative: 19 %
Lymphs Abs: 1.1 10*3/uL (ref 0.7–4.0)
MCH: 24.2 pg — ABNORMAL LOW (ref 26.0–34.0)
MCHC: 32.1 g/dL (ref 30.0–36.0)
MCV: 75.4 fL — ABNORMAL LOW (ref 80.0–100.0)
Monocytes Absolute: 0.5 10*3/uL (ref 0.1–1.0)
Monocytes Relative: 9 %
Neutro Abs: 4.1 10*3/uL (ref 1.7–7.7)
Neutrophils Relative %: 69 %
Platelet Count: 252 10*3/uL (ref 150–400)
RBC: 4.55 MIL/uL (ref 3.87–5.11)
RDW: 15.9 % — ABNORMAL HIGH (ref 11.5–15.5)
WBC Count: 5.9 10*3/uL (ref 4.0–10.5)
nRBC: 0 % (ref 0.0–0.2)

## 2022-05-18 MED ORDER — ABEMACICLIB 100 MG PO TABS
100.0000 mg | ORAL_TABLET | Freq: Two times a day (BID) | ORAL | 3 refills | Status: DC
  Filled 2022-05-18: qty 70, 35d supply, fill #0
  Filled 2022-05-20: qty 56, 28d supply, fill #0

## 2022-05-18 MED ORDER — OXYCODONE-ACETAMINOPHEN 7.5-325 MG PO TABS
1.0000 | ORAL_TABLET | Freq: Three times a day (TID) | ORAL | 0 refills | Status: DC | PRN
  Filled 2022-05-18: qty 102, 34d supply, fill #0

## 2022-05-18 NOTE — Assessment & Plan Note (Addendum)
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 Adjuvant radiation therapy 09/04/2021-10/30/2021 2. followed by adjuvant antiestrogen therapy started 12/03/2021   Letrozole toxicities:  Breast cancer surveillance: Breast exam 05/10/2022: Benign Mammogram 01/22/2022: With ultrasound: 3 cm seroma right breast at the site of palpable concern Right axilla lymph node biopsy: [place]

## 2022-05-18 NOTE — Addendum Note (Signed)
Addended by: [name] on: 05/18/2022 04:06 PM   Modules accepted: Orders

## 2022-05-18 NOTE — Progress Notes (Signed)
Per MD request RN placed call to Dr. Gillis Santa with [facility] pain management to alert office the MD will take over pain management due to metastatic disease to the bone.  No answer, LVM with Dr. Elwyn Lade nurse.  MD also requesting referral be placed to palliative care for pain management, referral placed.  Pt educated and verbalized understanding.

## 2022-05-18 NOTE — Therapy (Addendum)
OUTPATIENT PHYSICAL THERAPY ONCOLOGY TREATMENT  Patient Name: Chelsea Hansen MRN: [id number] DOB:06-24-1970, 51 y.o., female Today's Date: 05/18/2022   PT End of Session - 05/18/22 0808     Visit Number 11    Number of Visits 20    Date for PT Re-Evaluation 06/20/22    Authorization Type Medicaid    Authorization Time Period 11/27- 06/20/2022    Authorization - Visit Number 1    Authorization - Number of Visits 10    Progress Note Due on Visit 47    PT Start Time 0809    PT Stop Time 0034    PT Time Calculation (min) 45 min    Activity Tolerance Patient tolerated treatment well    Behavior During Therapy [facility] for tasks assessed/performed              Past Medical History:  Diagnosis Date   Allergy    seasonal allergies   Anxiety    on meds   Asthma    uses inhaler   Breast cancer ([place]) 2012   RIGHT lumpectomy   Cancer ([place]) 2022   RIGHT breast lump-dx 2022   Depression    on meds   DVT (deep venous thrombosis) ([place]) 2010   after hysterectomy   Family history of uterine cancer    GERD (gastroesophageal reflux disease)    with certain foods/OTC PRN meds   Headache(784.0)    Hyperlipidemia    on meds   Hypertension    on meds   SVT (supraventricular tachycardia)    Past Surgical History:  Procedure Laterality Date   ABDOMINAL HYSTERECTOMY     AXILLARY SENTINEL NODE BIOPSY Left 07/09/2021   Procedure: LEFT AXILLARY SENTINEL NODE BIOPSY;  Surgeon: [name], MD;  Location: [place];  Service: General;  Laterality: Left;   BREAST EXCISIONAL BIOPSY Right 09/16/2009   BREAST LUMPECTOMY WITH RADIOACTIVE SEED LOCALIZATION Bilateral 07/09/2021   Procedure: BILATERAL BREAST LUMPECTOMY WITH RADIOACTIVE SEED LOCALIZATION;  Surgeon: [name], MD;  Location: [place];  Service: General;  Laterality: Bilateral;   BREAST SURGERY     lumpectomy   PORT-A-CATH REMOVAL Left 07/09/2021   Procedure: REMOVAL PORT-A-CATH;   Surgeon: [name], MD;  Location: [place];  Service: General;  Laterality: Left;   PORTACATH PLACEMENT Left 01/26/2021   Procedure: INSERTION PORT-A-CATH;  Surgeon: [name], MD;  Location: [facility];  Service: General;  Laterality: Left;   RADIOACTIVE SEED GUIDED AXILLARY SENTINEL LYMPH NODE Right 07/09/2021   Procedure: RADIOACTIVE SEED GUIDED RIGHT AXILLARY SENTINEL LYMPH NODE DISSECTION;  Surgeon: [name], MD;  Location: [place];  Service: General;  Laterality: Right;   TUBAL LIGATION     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   GERD (gastroesophageal reflux disease) 05/27/2021   Hyperlipidemia 05/27/2021   Hypertension 05/27/2021   Pain management contract signed 05/04/2021   Chronic pain syndrome (breast cancer) 05/04/2021   Palpitations 03/25/2021   Sinus bradycardia 03/25/2021   Daytime somnolence 03/25/2021   Snoring 03/25/2021   Obesity (BMI 30-39.9) 03/25/2021   Port-A-Cath in place 01/27/2021   Genetic testing 12/24/2020   Family history of uterine cancer 12/17/2020   Malignant neoplasm of upper-outer quadrant of right breast in female, estrogen receptor positive ([place]) 12/12/2020   Malignant neoplasm of upper-outer quadrant of left breast in female, estrogen receptor positive ([place]) 12/12/2020  Hypokalemia 01/02/2012   Asthma 01/01/2012   Bradycardia 01/01/2012   Syncope 12/31/2011   Mediastinal adenopathy 12/31/2011   Anemia 12/31/2011   Chest pain 12/31/2011    PCP: Juluis Mire,  REFERRING PROVIDER: Wilber Bihari, NP  REFERRING DIAG: Bilateral Breast swelling, decreased shoulder ROM bilaterally, Bilateral LE neuropathy  THERAPY DIAG:  Malignant neoplasm of upper-outer quadrant of right breast in female, estrogen receptor positive ([place]) - Plan: PT plan of care  cert/re-cert  Malignant neoplasm of upper-outer quadrant of left breast in female, estrogen receptor positive ([place]) - Plan: PT plan of care cert/re-cert  Abnormal posture - Plan: PT plan of care cert/re-cert  Disturbance of skin sensation - Plan: PT plan of care cert/re-cert  Disorder of the skin and subcutaneous tissue related to radiation, unspecified - Plan: PT plan of care cert/re-cert  Lymphedema, not elsewhere classified - Plan: PT plan of care cert/re-cert  ONSET DATE: 06/624  Rationale for Evaluation and Treatment Rehabilitation  SUBJECTIVE                                                                                                                                                                                           SUBJECTIVE STATEMENT: I have an appt with Dr. Lindi Adie today for something and he wants bloodwork done today. The armpit is still swelling, and it always feels better after I leave here.It feels better when I do it at home  HISTORY Neoadjuvant chemotherapy with Doxorubicin and Cyclophosphamide given x 4 beginning 01/27/2021 and completing on 03/10/2021 followed by weekly paclitaxel x 12 beginning 05/2021 07/09/2021: Right lumpectomy: Grade 2 invasive lobular cancer, 2.5 cm, LCIS, margins negative, LCIS focally at anterior margin, 1/1 lymph node positive, ER 95%, PR 100%, HER2 negative, Ki-67 40% Left lumpectomy: High-grade DCIS: 0.7 cm, margins negative, 0/1 lymph node negative ER 95%, PR 100%  PAIN:  Are you having pain? Yes NPRS scale: 5/10  Pain location:   Right axilla Pain orientation: Right  PAIN TYPE: aching, burning, sharp, and tight Pain description: constant  Aggravating factors: household chores,lifting,sleeping,  Relieving factors: pain meds  PRECAUTIONS: Bilateral Lymphedema risk, neuropathy  WEIGHT BEARING RESTRICTIONS No  FALLS:  Has patient fallen in last 6 months? No  LIVING ENVIRONMENT: Lives with: lives with her fiancee Lives in:  House/apartment Stairs: Yes; Internal: 1 flight to basement steps; on left going up Has following equipment at home: Single point cane, Walker - 4 wheeled, shower chair, and bed side commode  OCCUPATION: mgr at [employer]'  LEISURE: shop, dominoes  HAND DOMINANCE : right   PRIOR LEVEL OF FUNCTION: Independent  PATIENT GOALS stop the pain,be able to  reach without pain, be able to clean   OBJECTIVE  COGNITION:  Overall cognitive status: Within functional limits for tasks assessed   PALPATION: Very tender throughout bilateral UQ  OBSERVATIONS / OTHER ASSESSMENTS: Both breast with generalized swelling and enlarged pores  SENSATION:  Light touch: Deficits at lateral breast, medial arm inact     POSTURE: Forward head, rounded shoulders  UPPER EXTREMITY AROM/PROM:  A/PROM RIGHT   eval  Right 03/25/2022 Right 03/31/2022 Right 05/05/2022 Right 05/18/2022  Shoulder extension 42  48    Shoulder flexion 110 pulls under arm 148 150 159 164  Shoulder abduction 103 128 155 178 178  Shoulder internal rotation 55  60  68  Shoulder external rotation 79  93  99    (Blank rows = not tested)  A/PROM LEFT   eval Left 03/25/2022 Left 03/31/2022 Left  05/05/2022 LEFT 05/18/2022  Shoulder extension 52  52    Shoulder flexion 120 147 159 159 162  Shoulder abduction 115 154 168 178 178  Shoulder internal rotation 55  60  73  Shoulder external rotation 80  91  100    (Blank rows = not tested)   CERVICAL AROM: All within functional limits:     UPPER EXTREMITY STRENGTH: NT due to pain  LOWER EXTREMITY MMT: 4+/5  LYMPHEDEMA ASSESSMENTS:   SURGERY TYPE/DATE: 07/09/2021 BILATERAL BREAST LUMPECTOMY WITH RADIOACTIVE SEED LOCALIZATION RADIOACTIVE SEED GUIDED RIGHT AXILLARY TARGETED LYMPH NODE DISSECTION LEFT AXILLARY SENTINEL NODE BIOPSY LEFT AXILLARY LYMPH NODE MAPPING WITH SENTIMAG  NUMBER OF LYMPH NODES REMOVED: Right 1/1, Left 0/1  CHEMOTHERAPY: neoadjuvant  RADIATION:yes  bilaterally  HORMONE TREATMENT: yes  INFECTIONS: no  LYMPHEDEMA ASSESSMENTS:   LANDMARK RIGHT  eval  10 cm proximal to olecranon process 30.5  Olecranon process 27.1  10 cm proximal to ulnar styloid process 21.2  Just proximal to ulnar styloid process 16.6  Across hand at thumb web space 21.4  At base of 2nd digit 6.8  (Blank rows = not tested)  LANDMARK LEFT  eval  10 cm proximal to olecranon process 30.7  Olecranon process 26.7  10 cm proximal to ulnar styloid process 20.5  Just proximal to ulnar styloid process 16.3  Across hand at thumb web space 21.1  At base of 2nd digit 6.7  (Blank rows = not tested)   FUNCTIONAL TESTS:   SLS ; 2-3 seconds ea leg GAIT: Distance walked: 20 Assistive device utilized: None Level of assistance: Complete Independence     QUICK DASH SURVEY: 77%,  03/31/2022: 31.82 BREAST COMPLAINTS SURVEY  :75  TODAY'S TREATMENT   05/18/2022 Pulleys flexion x 2 min, scaption x 1 min, abduction x 3 min ONG:[id number] performed short neck, 5 diaphragmatic breaths, activated right axillary and inguinal LN's, right axillo-inguinal pathway and right breast lateral to pathway, then medial right breast to lateral and pathway in supine and Left SL ending with LN's. Emphasis on axilla and medial breast   05/10/2022 Pulleys x 2 min flexion and abduction, 1 min scaption, ball rolls forward on wall x 10  Scapular Retraction with Resistance  10 reps-  - Scapular Retraction with Resistance Advanced  10 reps-  - Shoulder External Rotation and Scapular Retraction with Resistance10 reps GMW:[id number] performed short neck, 5 diaphragmatic breaths, activated right axillary and inguinal LN's, right axillo-inguinal pathway and right breast lateral to pathway, then medial right breast to lateral and pathway in supine and Left SL ending with LN's. Emphasis on axilla and medial breast  05/05/2022 Pulleys x 2 min flexion and abduction  TGP:[id number] performed  short neck, 5 diaphragmatic breaths, activated right axillary and inguinal LN's, right axillo-inguinal pathway and right breast lateral to pathway, then medial right breast to lateral and pathway ending with LN's. Emphasis on axilla and medial breast   04/29/2022  XEN:[id number] performed short neck, 5 diaphragmatic breaths, activated right axillary and inguinal LN's, right axillo-inguinal pathway and right breast lateral to pathway, then medial right breast to lateral and pathway ending with LN's. Therapist then did the same steps but on the left breast. PROM bilateral shoulders flex, scaption ,abd MFR to right axillary area of cording AROM bilateral shoulder flexion, scaption, horizontal abd.   04/12/2022 Pt observed with increased swelling right axillary region with continued swelling at bilateral breast. Adjusted pts bra up to the last notch to try and put compression more under the arm. Therapist performed short neck, 5 diaphragmatic breaths, activated right axillary and inguinal LN's, right axillo-inguinal pathway and right breast lateral to pathway, then medial right breast to lateral and pathway. Pt was instructed in short neck, breaths, LN activation and pathway and was given written handout. Pt was found to have several painful cords in the right axillary region. MFR was performed to cords with arm in scaption. PROM with MFR was performed for right shoulder flexion and abd.  03/31/2022 Pulleys x 1:30 flex, scaption, abduction Supine wand flexion and scaption x 5 ea, star gazer x 5 Measured bilateral shoulder ROM and checked goals Made spaghetti foam pads for medial side of bilateral breast where enlarged pores and fibrosis are present.  Marland Kitchen  PATIENT EDUCATION:  Education details: POC, Flexi touch, compression bra and gave script. (Pt in agreement with sending demo to [facility]), supine wand, Access Code: [email or web address] G URL: [email or web address] Date:  05/10/2022 Prepared by: Cheral Almas  Exercises - Scapular Retraction with Resistance  - 1 x daily - 7 x weekly - 1 sets - 10 reps - Scapular Retraction with Resistance Advanced  - 1 x daily - 7 x weekly - 1 sets - 10 reps - Shoulder External Rotation and Scapular Retraction with Resistance  - 1 x daily - 7 x weekly - 1 sets - 10 reps Person educated: Patient Education method: Explanation Education comprehension: verbalized understanding   HOME EXERCISE PROGRAM: Eval :None given today due to time  ASSESSMENT:  CLINICAL IMPRESSION: Pt is making good progress toward goals. Her shoulder ROM is now WNL. She is able to dress, bathe and reach with minimal pain. Her breast edema is reducing and right axillary swelling is also improved. She does continue to have enlarged pores bilaterally, but significantly better especially on the left. She is awaiting a flexi touch for home use. She is compliant with her ROM exercises and MLD at home. She will benefit from further treatment to progress balance and review MLD while she is awaiting her flexitouch. OBJECTIVE IMPAIRMENTS decreased activity tolerance, decreased balance, decreased knowledge of condition, difficulty walking, decreased ROM, increased edema, increased fascial restrictions, impaired sensation, impaired UE functional use, postural dysfunction, and pain.   ACTIVITY LIMITATIONS carrying, lifting, sleeping, bathing, dressing, and reach over head  PARTICIPATION LIMITATIONS: cleaning, occupation, and yard work  PERSONAL FACTORS 3+ comorbidities: Bilateral breast Cancer with chemo/radiation, pain management,  are also affecting patient's functional outcome.   REHAB POTENTIAL: Good  CLINICAL DECISION MAKING: Stable/uncomplicated  EVALUATION COMPLEXITY: Low  GOALS: Goals reviewed with patient? Yes  SHORT TERM GOALS: Target date: 06/08/2022    Pt will be  independent and compliant with a HEP for shoulder ROM and strength Baseline: Goal  status: MET 2.  Pt will have improved bilateral shoulder flexion and abd to atleast 135 Baseline:  Goal status: MET 03/31/2022  3. Pt will have Flexi touch trial for breast lymphedema In Progress:MET  LONG TERM GOALS: Target date: 06/29/2022    Pt will have bilateral shoulder flexion atleast 145 degrees and abduction atleast 160 degrees for improved reaching ability Baseline:  Goal status: MET left 03/31/2022, progressing Right MET Right 05/05/2022  2.  Pts quick dash will be improved to no greater than 25% for improved function Baseline: 33% today Goal status: in PROGRESS 3.  Pt will be independent in self MLD to decrease bilateral breast swelling Baseline:  Goal status: In Progress  4.  Pt will purchase a compression bra to decrease breast swelling Baseline:  Goal status: MET 5.  Pt will be able to dress, bathe and perform reaching activities with minimal pain. Baseline:  Goal status: MET 05/05/2022  6. Pt will receive flexitouch sequential compression pump and will be independent in its use to decrease bilateral breast edema.  Baseline:hasn't received  Goal status New 7. Pt will improve SLS to 10 sec each leg to demonstrate improved balance and decrease risk of falls  Baseline 2-3 sec each side  Goal status:NEW  PLAN: PT FREQUENCY: 2x/week  PT DURATION:   weeks  PLANNED INTERVENTIONS: Therapeutic exercises, Therapeutic activity, Neuromuscular re-education, Balance training, Patient/Family education, Self Care, Joint mobilization, Orthotic/Fit training, Manual lymph drainage, scar mobilization, Vasopneumatic device, Manual therapy, and Re-evaluation  PLAN FOR NEXT SESSION:Recert next, pt to wear compression bra; how did spaghetti  foam medially in bra do? , initiate and instruct Bilateral breast MLD.still awaiting Flexitouch appropval, ROM and STM prn.    Claris Pong, PT 05/18/2022, 3:51 PM

## 2022-05-19 ENCOUNTER — Other Ambulatory Visit (HOSPITAL_COMMUNITY): Payer: Self-pay

## 2022-05-19 ENCOUNTER — Telehealth: Payer: Self-pay | Admitting: Radiation Oncology

## 2022-05-19 ENCOUNTER — Telehealth: Payer: Self-pay

## 2022-05-19 LAB — MISC LABCORP TEST (SEND OUT): Labcorp test code: 6486

## 2022-05-19 NOTE — Progress Notes (Signed)
Radiation Oncology         ([phone]) [phone] ________________________________  Outpatient Re-Consultation  Name: Chelsea Hansen MRN: [id number]  Date: 05/20/2022  DOB: [birth year]  HF:WYOVZCH, Milford Cage, NP  [name], MD   REFERRING PHYSICIAN: [name], MD  DIAGNOSIS: There were no encounter diagnoses.  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 female who is accompanied by ***. she is seen as a courtesy of Dr. Lindi Adie for re-evaluation an opinion concerning radiation therapy as part of management for her recently diagnosed osseous metastatic disease (presumably from breast cancer primary).   Since the patient completed radiation therapy, she began antiestrogen therapy consisting of letrozole on 12/03/21. She required a couple of weeks off of letrozole due to an increase in joint pain and leg aches which resolved during her break. Given her symptoms, she was switched to anastrozole this past October.   On 04/14/22, the patient presented to Wilber Bihari NP for evaluation of new right axillary swelling. She reported that her swelling was severe  enough (several days prior) to the point where she could not put her arm all the way down.  This prompted a right axillary ultrasound on 04/23/22 which revealed an indeterminate irregular mass in the upper outer right axilla measuring 1.2 x 0.6 x 0.9 cm. This was biopsied on 04/26/22 and showed findings consistent with lipoma.   Given her new onset of swelling and increased risk for recurrence, Causey NP recommended a chest CT to complete staging work-up.   Subsequent chest CT performed on 05/11/22 unfortunately showed multifocal sclerotic bony metastatic disease, greatest in the thoracic spine. Sclerotic rib lesions were also appreciated in the bilateral chest, most notably at the left 6th posterior rib. CT also showed areas of sclerosis at multiple levels, a small focal area of nodularity in the lateral left breast which could be postoperative in etiology, and collateral pathways about the chest suggestive of some subclavian venous narrowing on the left. CT otherwise showed no evidence of nodal or visceral metastatic disease in the chest.    During her most recent follow-up visit with Dr. Lindi Adie on 05/18/22, the patient endorsed severe back pain, as well as joint stiffness and aching. For treatment of her new osseous metastatic disease, Dr. Lindi Adie has recommended adding Verzinio along with her anastrozole which she is agreeable to.    Of note: the patient had a bilateral breast mammogram and right breast ultrasound performed on 01/21/22 which showed a 3 cm seroma.   PAST MEDICAL HISTORY:  Past Medical History:  Diagnosis Date   Allergy    seasonal allergies   Anxiety    on meds   Asthma  uses inhaler   Breast cancer ([place]) 2012   RIGHT lumpectomy   Cancer ([place]) 2022   RIGHT breast lump-dx 2022   Depression    on meds   DVT (deep venous thrombosis) ([place]) 2010   after hysterectomy   Family history of uterine cancer    GERD (gastroesophageal reflux disease)    with certain foods/OTC PRN  meds   Headache(784.0)    Hyperlipidemia    on meds   Hypertension    on meds   SVT (supraventricular tachycardia)     PAST SURGICAL HISTORY: Past Surgical History:  Procedure Laterality Date   ABDOMINAL HYSTERECTOMY     AXILLARY SENTINEL NODE BIOPSY Left 07/09/2021   Procedure: LEFT AXILLARY SENTINEL NODE BIOPSY;  Surgeon: [name], MD;  Location: [facility];  Service: General;  Laterality: Left;   BREAST EXCISIONAL BIOPSY Right 09/16/2009   BREAST LUMPECTOMY WITH RADIOACTIVE SEED LOCALIZATION Bilateral 07/09/2021   Procedure: BILATERAL BREAST LUMPECTOMY WITH RADIOACTIVE SEED LOCALIZATION;  Surgeon: [name], MD;  Location: [facility];  Service: General;  Laterality: Bilateral;   BREAST SURGERY     lumpectomy   PORT-A-CATH REMOVAL Left 07/09/2021   Procedure: REMOVAL PORT-A-CATH;  Surgeon: [name], MD;  Location: [facility];  Service: General;  Laterality: Left;   PORTACATH PLACEMENT Left 01/26/2021   Procedure: INSERTION PORT-A-CATH;  Surgeon: [name], MD;  Location: [facility];  Service: General;  Laterality: Left;   RADIOACTIVE SEED GUIDED AXILLARY SENTINEL LYMPH NODE Right 07/09/2021   Procedure: RADIOACTIVE SEED GUIDED RIGHT AXILLARY SENTINEL LYMPH NODE DISSECTION;  Surgeon: [name], MD;  Location: [facility];  Service: General;  Laterality: Right;   TUBAL LIGATION      FAMILY HISTORY:  Family History  Problem Relation Age of Onset   Breast cancer Mother        86s   Hypertension Father    Uterine cancer Maternal Aunt    Ovarian cancer Maternal Aunt    Breast cancer Maternal Aunt        20s   Breast cancer Cousin 51   Uterine cancer Cousin 50   Colon polyps Neg Hx    Colon cancer Neg Hx    Esophageal cancer Neg Hx    Stomach cancer Neg Hx     SOCIAL HISTORY:  Social History   Tobacco Use   Smoking status: Never   Smokeless tobacco: Never  Vaping Use   Vaping  Use: Never used  Substance Use Topics   Alcohol use: Not Currently    Alcohol/week: 7.0 standard drinks of alcohol    Types: 7 Standard drinks or equivalent per week    Comment: occassional   Drug use: No    ALLERGIES: No Known Allergies  MEDICATIONS:  Current Outpatient Medications  Medication Sig Dispense Refill   [START ON 05/25/2022] abemaciclib (VERZENIO) 100 MG tablet Take 1 tablet (100 mg total) by mouth 2 (two) times daily. 56 tablet 3   albuterol (VENTOLIN HFA) 108 (90 Base) MCG/ACT inhaler Inhale 2 puffs by mouth into the lungs every 6 hours as needed for wheezing or shortness of breath. 18 g 2   amLODipine (NORVASC) 10 MG tablet Take 1 tablet  by mouth daily. 90 tablet 1   anastrozole (ARIMIDEX) 1 MG tablet Take 1 tablet (1 mg total) by mouth daily. 90 tablet 3   ARIPiprazole (ABILIFY) 15 MG tablet Take 1 tablet by mouth at bedtime 30 tablet 0   escitalopram (  LEXAPRO) 20 MG tablet Take 1 tablet (20 mg total) by mouth daily. 90 tablet 1   hydrochlorothiazide (HYDRODIURIL) 25 MG tablet Take 1 tablet by mouth daily. 30 tablet 0   letrozole (FEMARA) 2.5 MG tablet Take 1 tablet (2.5 mg total) by mouth daily. 90 tablet 3   montelukast (SINGULAIR) 10 MG tablet Take 1 tablet (10 mg total) by mouth at bedtime. 90 tablet 3   [START ON 07/20/2022] oxyCODONE-acetaminophen (PERCOCET) 5-325 MG tablet Take 1 tablet by mouth every 6 (six) hours as needed for severe pain. (07/20/22) 120 tablet 0   oxyCODONE-acetaminophen (PERCOCET) 7.5-325 MG tablet Take 1 tablet by mouth every 8 (eight) hours as needed for severe pain. 102 tablet 0   propranolol (INDERAL) 10 MG tablet Take 1 tablet (10 mg total) by mouth 2 (two) times daily. 180 tablet 3   valsartan (DIOVAN) 40 MG tablet Take 1 tablet (40 mg total) by mouth daily. 90 tablet 3   No current facility-administered medications for this encounter.   Facility-Administered Medications Ordered in Other Encounters  Medication Dose Route Frequency  Provider Last Rate Last Admin   acetaminophen (TYLENOL) 500 MG tablet            ondansetron (ZOFRAN) 4 MG/2ML injection            ondansetron (ZOFRAN) 4 MG/2ML injection             REVIEW OF SYSTEMS:  A 10+ POINT REVIEW OF SYSTEMS WAS OBTAINED including neurology, dermatology, psychiatry, cardiac, respiratory, lymph, extremities, GI, GU, musculoskeletal, constitutional, reproductive, HEENT. ***   PHYSICAL EXAM:  vitals were not taken for this visit.   General: Alert and oriented, in no acute distress HEENT: Head is normocephalic. Extraocular movements are intact. Oropharynx is clear. Neck: Neck is supple, no palpable cervical or supraclavicular lymphadenopathy. Heart: Regular in rate and rhythm with no murmurs, rubs, or gallops. Chest: Clear to auscultation bilaterally, with no rhonchi, wheezes, or rales. Abdomen: Soft, nontender, nondistended, with no rigidity or guarding. Extremities: No cyanosis or edema. Lymphatics: see Neck Exam Skin: No concerning lesions. Musculoskeletal: symmetric strength and muscle tone throughout. Neurologic: Cranial nerves II through XII are grossly intact. No obvious focalities. Speech is fluent. Coordination is intact. Psychiatric: Judgment and insight are intact. Affect is appropriate.  Left Breast: *** Right Breast: ***  ECOG = ***  0 - Asymptomatic (Fully active, able to carry on all predisease activities without restriction)  1 - Symptomatic but completely ambulatory (Restricted in physically strenuous activity but ambulatory and able to carry out work of a light or sedentary nature. For example, light housework, office work)  2 - Symptomatic, <50% in bed during the day (Ambulatory and capable of all self care but unable to carry out any work activities. Up and about more than 50% of waking hours)  3 - Symptomatic, >50% in bed, but not bedbound (Capable of only limited self-care, confined to bed or chair 50% or more of waking hours)  4 -  Bedbound (Completely disabled. Cannot carry on any self-care. Totally confined to bed or chair)  5 - Death   Eustace Pen MM, Creech RH, Tormey DC, et al. [phone]). "Toxicity and response criteria of the [facility]". [place] Oncol. 5 (6): 649-55  LABORATORY DATA:  Lab Results  Component Value Date   WBC 5.9 05/18/2022   HGB 11.0 (L) 05/18/2022   HCT 34.3 (L) 05/18/2022   MCV 75.4 (L) 05/18/2022   PLT 252 05/18/2022  NEUTROABS 4.1 05/18/2022   Lab Results  Component Value Date   NA 139 05/18/2022   K 3.6 05/18/2022   CL 104 05/18/2022   CO2 30 05/18/2022   GLUCOSE 86 05/18/2022   BUN 14 05/18/2022   CREATININE 0.84 05/18/2022   CALCIUM 9.6 05/18/2022      RADIOGRAPHY: CT Chest W Contrast  Result Date: 05/13/2022 CLINICAL DATA:  A 51 year old female with history of breast cancer presents for follow-up. * Tracking Code: BO * EXAM: CT CHEST WITH CONTRAST TECHNIQUE: Multidetector CT imaging of the chest was performed during intravenous contrast administration. RADIATION DOSE REDUCTION: This exam was performed according to the departmental dose-optimization program which includes automated exposure control, adjustment of the mA and/or kV according to patient size and/or use of iterative reconstruction technique. CONTRAST:  66m OMNIPAQUE IOHEXOL 300 MG/ML  SOLN COMPARISON:  July of 2018. FINDINGS: Cardiovascular: Mild cardiac enlargement. No aortic dilation. Central pulmonary vasculature is unremarkable on venous phase. Collateral pathways about the chest suggest some subclavian venous narrowing on the LEFT. This could also be related to arm position. Mediastinum/Nodes: No thoracic inlet, axillary, mediastinal or hilar adenopathy. Esophagus grossly normal. Lungs/Pleura: No effusion. No consolidative changes. Airways are patent. Basilar atelectasis. Upper Abdomen: Incidental imaging of upper abdominal contents without acute process. Imaged portions the liver, pancreas,  spleen, adrenal glands and kidneys are unremarkable. Musculoskeletal: Sclerotic lesions in the spine not present on more remote imaging. Areas of sclerosis at multiple levels. Vertebral body involvement and posterior element involvement at T9 for example with area of greatest involvement at this vertebral level. Sclerosis in the vertebral body measuring 2.7 x 1.4 cm. Posterior element sclerosis at T1. Involvement at T4, T5, T6, T7, T8, T9 as above, T10, T11 and T12. Sclerotic rib lesions also present in the bilateral chest most notably at the LEFT 6 posterior rib. No frankly destructive bone findings or acute bone process. Signs of RIGHT breast lumpectomy. Surgical clips in the LEFT breast as well, lateral to inferior surgical clips in the LEFT breast is an area of added density measuring 16 x 17 mm that is more focal than other areas. IMPRESSION: 1. Multifocal sclerotic bony metastatic disease greatest in the thoracic spine with scattered rib lesions and sclerosis as well. 2. No nodal or visceral metastasis about the chest. 3. Collateral pathways about the chest suggest some subclavian venous narrowing on the LEFT. This could also be related to arm position. Correlate clinically. 4. Signs of RIGHT breast lumpectomy. Potential postoperative changes in the LEFT breast with small focal area of nodularity in the lateral LEFT breast which could also be postoperative, correlate clinically and consider mammographic correlation as warranted. 5. Mild cardiac enlargement. Electronically Signed   By: GZetta BillsM.D.   On: 05/13/2022 11:04   [place] NO[place] CORE BIOPSY RIGHT  Addendum Date: 04/27/2022   ADDENDUM REPORT: 04/27/2022 14:09 ADDENDUM: Pathology revealed MATURE ADIPOSE TISSUE CONSISTENT WITH THE CLINICAL IMPRESSION OF LIPOMA (NO LYMPHOID TISSUE PRESENT IN THE BIOPSY SPECIMEN) of RIGHT axilla. This was found to be concordant by Dr. SKristopher Oppenheim Pathology results were discussed with the patient by  telephone. The patient reported doing well after the biopsy with tenderness at the site. Post biopsy instructions and care were reviewed and questions were answered. The [facility]. Recommendation:1- Clinical follow up for right axillary swelling. 2- The patient was instructed to return for bilateral annual diagnostic mammography due August 2024.  Pathology results reported by Stacie Acres RN on 04/27/2022. Electronically Signed   By: Kristopher Oppenheim M.D.   On: 04/27/2022 14:09   Result Date: 04/27/2022 CLINICAL DATA:  51 year old female with an indeterminate right axillary mass. EXAM: ULTRASOUND-GUIDED AXILLARY CORE BIOPSY RIGHT COMPARISON:  Previous exam(s). PROCEDURE: I met with the patient and we discussed the procedure of ultrasound-guided biopsy, including benefits and alternatives. We discussed the high likelihood of a successful procedure. We discussed the risks of the procedure, including infection, bleeding, tissue injury, clip migration, and inadequate sampling. Informed written consent was given. The usual time-out protocol was performed immediately prior to the procedure. Using sterile technique and 1% Lidocaine as local anesthetic, under direct ultrasound visualization, a 14 gauge spring-loaded device was used to perform biopsy of a mass in the right axilla using a lateral approach. At the conclusion of the procedure a HydroMARK tissue marker clip was deployed into the biopsy cavity. Follow up 2 view mammogram was deferred at this time. IMPRESSION: Ultrasound guided biopsy of the right axilla. No apparent complications. Electronically Signed: By: Kristopher Oppenheim M.D. On: 04/26/2022 14:32  [place] AXILLA RIGHT  Result Date: 04/23/2022 CLINICAL DATA:  51 year old female status post bilateral lumpectomies with right axillary lymph node removal presenting with intermittent swelling of the right axilla. EXAM: ULTRASOUND OF  THE RIGHT AXILLA COMPARISON:  Previous exam(s). FINDINGS: Ultrasound is performed, showing postsurgical scarring and mild diffuse edema within the right axilla. Visualized lymph nodes are morphologically normal. Additionally, an irregular hypoechoic mass is demonstrated in the superficial upper outer right axilla. It measures 1.2 x 0.6 x 0.9 cm. IMPRESSION: 1. Indeterminate irregular mass in the upper outer right axilla. Findings may represent a prominent fat lobule versus atypical lymph node. Recommendation is for ultrasound-guided biopsy. 2. Otherwise, no suspicious lymphadenopathy in the remainder of the right axilla. RECOMMENDATION: Ultrasound-guided biopsy of the right axilla. I have discussed the findings and recommendations with the patient. If applicable, a reminder letter will be sent to the patient regarding the next appointment. BI-RADS CATEGORY  4: Suspicious. Electronically Signed   By: Kristopher Oppenheim M.D.   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  We discussed the natural history of *** and general treatment, highlighting the role of radiotherapy in the management.  We discussed the available radiation techniques, and focused on the details of logistics and delivery.  We reviewed the anticipated acute and late sequelae associated with radiation in this setting.  The patient was encouraged to ask questions that I answered to the best of my ability. *** A patient consent form was discussed and signed.  We retained a copy for our records.  The patient  would like to proceed with radiation and will be scheduled for CT simulation.  PLAN: ***    *** minutes of total time was spent for this patient encounter, including preparation, face-to-face counseling with the patient and coordination of care, physical exam, and documentation of the encounter.   ------------------------------------------------  Blair Promise, PhD, MD  This document serves as a record of  services personally performed by [name], MD. It was created on his behalf by Roney Mans, a trained medical scribe. The creation of this record is based on the scribe's personal observations and the provider's statements to them. This document has been checked and approved by the attending provider.

## 2022-05-19 NOTE — Progress Notes (Incomplete)
Histology and Location of Primary Cancer: Malignant neoplasm of upper-outer quadrant of right breast   Sites of Visceral and Bony Metastatic Disease: CT chest from 05/13/2022 showed "multifocal sclerotic bony metastatic disease greatest in the thoracic spine with scattered rib lesions and sclerosis as well."  Past/Anticipated chemotherapy by medical oncology, if any: Verzinio along with anastrozole.   Pain on a scale of 0-10 is: {Number; 1-47  not applicable:20727}   If Spine Met(s), symptoms, if any, include: Bowel/Bladder retention or incontinence (please describe): *** Numbness or weakness in extremities (please describe): *** Current Decadron regimen, if applicable: ***  Ambulatory status? Walker? Wheelchair?: {VQI Ambulatory Status:20974}  SAFETY ISSUES: Prior radiation? : 09/03/2021 through 11/02/2021 to bil breasts Pacemaker/ICD? {:18581} Possible current pregnancy? no Is the patient on methotrexate? {:18581}  Current Complaints / other details:  ***

## 2022-05-19 NOTE — Telephone Encounter (Signed)
11/29 @ 3:42 pm Left voicemail for patient to call our office to be schedule for consult.

## 2022-05-19 NOTE — Telephone Encounter (Signed)
Merleen Nicely from [facility] called and left a vm letting us know that Dr. Lindi Adie will be taking over her pain medicines, because the patient has mets to the bones. If you have any questions please call Merleen Nicely at [facility]. [phone]

## 2022-05-20 ENCOUNTER — Other Ambulatory Visit: Payer: Self-pay | Admitting: *Deleted

## 2022-05-20 ENCOUNTER — Other Ambulatory Visit (HOSPITAL_COMMUNITY): Payer: Self-pay

## 2022-05-20 ENCOUNTER — Telehealth: Payer: Self-pay

## 2022-05-20 ENCOUNTER — Ambulatory Visit: Payer: Medicaid Other

## 2022-05-20 ENCOUNTER — Telehealth: Payer: Self-pay | Admitting: *Deleted

## 2022-05-20 ENCOUNTER — Telehealth: Payer: Self-pay | Admitting: Pharmacy Technician

## 2022-05-20 ENCOUNTER — Ambulatory Visit
Admission: RE | Admit: 2022-05-20 | Discharge: 2022-05-20 | Disposition: A | Discharge status: Home / Self Care | Payer: Medicaid Other | Source: Ambulatory Visit | Attending: Radiation Oncology | Admitting: Radiation Oncology

## 2022-05-20 ENCOUNTER — Encounter: Payer: Self-pay | Admitting: Radiation Oncology

## 2022-05-20 VITALS — BP 147/88 | HR 67 | Temp 98.2°F | Resp 18 | Ht 71.0 in | Wt 220.4 lb

## 2022-05-20 DIAGNOSIS — I10 Essential (primary) hypertension: Secondary | ICD-10-CM | POA: Diagnosis not present

## 2022-05-20 DIAGNOSIS — C7951 Secondary malignant neoplasm of bone: Secondary | ICD-10-CM

## 2022-05-20 DIAGNOSIS — C50412 Malignant neoplasm of upper-outer quadrant of left female breast: Secondary | ICD-10-CM | POA: Diagnosis present

## 2022-05-20 DIAGNOSIS — C50411 Malignant neoplasm of upper-outer quadrant of right female breast: Secondary | ICD-10-CM | POA: Diagnosis not present

## 2022-05-20 DIAGNOSIS — R209 Unspecified disturbances of skin sensation: Secondary | ICD-10-CM

## 2022-05-20 DIAGNOSIS — Z170 Estrogen receptor positive status [ER+]: Secondary | ICD-10-CM | POA: Insufficient documentation

## 2022-05-20 DIAGNOSIS — Z8042 Family history of malignant neoplasm of prostate: Secondary | ICD-10-CM | POA: Insufficient documentation

## 2022-05-20 DIAGNOSIS — E785 Hyperlipidemia, unspecified: Secondary | ICD-10-CM | POA: Insufficient documentation

## 2022-05-20 DIAGNOSIS — I471 Supraventricular tachycardia, unspecified: Secondary | ICD-10-CM | POA: Insufficient documentation

## 2022-05-20 DIAGNOSIS — R102 Pelvic and perineal pain: Secondary | ICD-10-CM | POA: Insufficient documentation

## 2022-05-20 DIAGNOSIS — M549 Dorsalgia, unspecified: Secondary | ICD-10-CM | POA: Diagnosis not present

## 2022-05-20 DIAGNOSIS — C50919 Malignant neoplasm of unspecified site of unspecified female breast: Secondary | ICD-10-CM

## 2022-05-20 DIAGNOSIS — K219 Gastro-esophageal reflux disease without esophagitis: Secondary | ICD-10-CM | POA: Insufficient documentation

## 2022-05-20 DIAGNOSIS — Z79811 Long term (current) use of aromatase inhibitors: Secondary | ICD-10-CM | POA: Insufficient documentation

## 2022-05-20 DIAGNOSIS — Z803 Family history of malignant neoplasm of breast: Secondary | ICD-10-CM | POA: Diagnosis not present

## 2022-05-20 DIAGNOSIS — Z923 Personal history of irradiation: Secondary | ICD-10-CM | POA: Diagnosis not present

## 2022-05-20 DIAGNOSIS — Z79899 Other long term (current) drug therapy: Secondary | ICD-10-CM | POA: Diagnosis not present

## 2022-05-20 DIAGNOSIS — Z9221 Personal history of antineoplastic chemotherapy: Secondary | ICD-10-CM | POA: Insufficient documentation

## 2022-05-20 DIAGNOSIS — I119 Hypertensive heart disease without heart failure: Secondary | ICD-10-CM | POA: Diagnosis not present

## 2022-05-20 DIAGNOSIS — L599 Disorder of the skin and subcutaneous tissue related to radiation, unspecified: Secondary | ICD-10-CM

## 2022-05-20 DIAGNOSIS — R293 Abnormal posture: Secondary | ICD-10-CM

## 2022-05-20 DIAGNOSIS — Z86718 Personal history of other venous thrombosis and embolism: Secondary | ICD-10-CM | POA: Insufficient documentation

## 2022-05-20 DIAGNOSIS — C50911 Malignant neoplasm of unspecified site of right female breast: Secondary | ICD-10-CM | POA: Insufficient documentation

## 2022-05-20 DIAGNOSIS — I890 Lymphedema, not elsewhere classified: Secondary | ICD-10-CM

## 2022-05-20 NOTE — Telephone Encounter (Signed)
Oral Oncology Patient Advocate Encounter  After completing a benefits investigation, prior authorization for Verzenio is not required at this time through [place].  Patient's copay is $0.     Lady Deutscher, CPhT-Adv Oncology Pharmacy Patient [place] Direct Number: [phone]  Fax: [phone]

## 2022-05-20 NOTE — Therapy (Signed)
OUTPATIENT PHYSICAL THERAPY ONCOLOGY TREATMENT  Patient Name: Chelsea [facility] MRN: [id number] DOB:[birth year], 51 y.o., female Today's Date: 05/20/2022   PT End of Session - 05/20/22 0804     Visit Number 12    Number of Visits 20    Date for PT Re-Evaluation 06/20/22    Authorization Type Medicaid    Authorization Time Period 11/27- 06/20/2022    Authorization - Number of Visits 2    Progress Note Due on Visit 67    PT Start Time 0805    PT Stop Time 0623    PT Time Calculation (min) 51 min    Activity Tolerance Patient tolerated treatment well    Behavior During Therapy [facility] for tasks assessed/performed              Past Medical History:  Diagnosis Date   Allergy    seasonal allergies   Anxiety    on meds   Asthma    uses inhaler   Breast cancer ([place]) 2012   RIGHT lumpectomy   Cancer ([place]) 2022   RIGHT breast lump-dx 2022   Depression    on meds   DVT (deep venous thrombosis) ([place]) 2010   after hysterectomy   Family history of uterine cancer    GERD (gastroesophageal reflux disease)    with certain foods/OTC PRN meds   Headache(784.0)    Hyperlipidemia    on meds   Hypertension    on meds   SVT (supraventricular tachycardia)    Past Surgical History:  Procedure Laterality Date   ABDOMINAL HYSTERECTOMY     AXILLARY SENTINEL NODE BIOPSY Left 07/09/2021   Procedure: LEFT AXILLARY SENTINEL NODE BIOPSY;  Surgeon: [name], MD;  Location: [place];  Service: General;  Laterality: Left;   BREAST EXCISIONAL BIOPSY Right 09/16/2009   BREAST LUMPECTOMY WITH RADIOACTIVE SEED LOCALIZATION Bilateral 07/09/2021   Procedure: BILATERAL BREAST LUMPECTOMY WITH RADIOACTIVE SEED LOCALIZATION;  Surgeon: [name], MD;  Location: [place];  Service: General;  Laterality: Bilateral;   BREAST SURGERY     lumpectomy   PORT-A-CATH REMOVAL Left 07/09/2021   Procedure: REMOVAL PORT-A-CATH;  Surgeon: [name], MD;  Location:  [place];  Service: General;  Laterality: Left;   PORTACATH PLACEMENT Left 01/26/2021   Procedure: INSERTION PORT-A-CATH;  Surgeon: [name], MD;  Location: [facility];  Service: General;  Laterality: Left;   RADIOACTIVE SEED GUIDED AXILLARY SENTINEL LYMPH NODE Right 07/09/2021   Procedure: RADIOACTIVE SEED GUIDED RIGHT AXILLARY SENTINEL LYMPH NODE DISSECTION;  Surgeon: [name], MD;  Location: [place];  Service: General;  Laterality: Right;   TUBAL LIGATION     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   GERD (gastroesophageal reflux disease) 05/27/2021   Hyperlipidemia 05/27/2021   Hypertension 05/27/2021   Pain management contract signed 05/04/2021   Chronic pain syndrome (breast cancer) 05/04/2021   Palpitations 03/25/2021   Sinus bradycardia 03/25/2021   Daytime somnolence 03/25/2021   Snoring 03/25/2021   Obesity (BMI 30-39.9) 03/25/2021   Port-A-Cath in place 01/27/2021   Genetic testing 12/24/2020   Family history of uterine cancer 12/17/2020   Malignant neoplasm of upper-outer quadrant of right breast in female, estrogen receptor positive ([place]) 12/12/2020   Malignant neoplasm of upper-outer quadrant of left breast in female, estrogen receptor positive ([place]) 12/12/2020   Hypokalemia 01/02/2012   Asthma 01/01/2012  Bradycardia 01/01/2012   Syncope 12/31/2011   Mediastinal adenopathy 12/31/2011   Anemia 12/31/2011   Chest pain 12/31/2011    PCP: Juluis Mire,  REFERRING PROVIDER: Wilber Bihari, NP  REFERRING DIAG: Bilateral Breast swelling, decreased shoulder ROM bilaterally, Bilateral LE neuropathy  THERAPY DIAG:  Malignant neoplasm of upper-outer quadrant of right breast in female, estrogen receptor positive ([place])  Malignant neoplasm of upper-outer quadrant of left breast in female, estrogen  receptor positive ([place])  Abnormal posture  Disturbance of skin sensation  Disorder of the skin and subcutaneous tissue related to radiation, unspecified  Lymphedema, not elsewhere classified  ONSET DATE: 01/2021  Rationale for Evaluation and Treatment Rehabilitation  SUBJECTIVE                                                                                                                                                                                           SUBJECTIVE STATEMENT:  I found out from my CT that I am lit up like a Christmas tree with bone METS. Stage 4 Cancer in my entire spine and ribs.   I will meet with Dr. Sondra Come today about radiation. I don't understand why it wasn't found before now. I have been going to pain management.   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 Yes NPRS scale: 5/10  Pain location:    back, 3/10 axilla Pain orientation: Right  PAIN TYPE: aching, burning, sharp, and tight Pain description: constant  Aggravating factors: household chores,lifting,sleeping,  Relieving factors: pain meds  PRECAUTIONS: Bilateral Lymphedema risk, neuropathy  WEIGHT BEARING RESTRICTIONS No  FALLS:  Has patient fallen in last 6 months? 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[employer]'  LEISURE: shop, dominoes  HAND DOMINANCE : right   PRIOR LEVEL OF FUNCTION: Independent  PATIENT GOALS stop the pain,be able to reach without pain, be able to clean   OBJECTIVE  COGNITION:  Overall cognitive status: Within functional limits for tasks assessed   PALPATION: Very tender throughout bilateral UQ  OBSERVATIONS / OTHER ASSESSMENTS: Both breast with generalized swelling and enlarged pores  SENSATION:  Light touch: Deficits at lateral breast, medial arm inact     POSTURE: Forward head, rounded shoulders  UPPER EXTREMITY AROM/PROM:  A/PROM RIGHT   eval  Right 03/25/2022 Right 03/31/2022 Right 05/05/2022 Right 05/18/2022  Shoulder extension 42  48    Shoulder flexion 110 pulls under arm 148 150 159 164  Shoulder abduction 103 128 155 178 178  Shoulder internal rotation 55  60  68  Shoulder external rotation 79  93  99    (Blank rows = not tested)  A/PROM LEFT   eval Left 03/25/2022 Left 03/31/2022 Left  05/05/2022 LEFT 05/18/2022  Shoulder extension 52  52    Shoulder flexion 120 147 159 159 162  Shoulder abduction 115 154 168 178 178  Shoulder internal rotation 55  60  73  Shoulder external rotation 80  91  100    (Blank rows = not tested)   CERVICAL AROM: All within functional limits:     UPPER EXTREMITY STRENGTH: NT due to pain  LOWER EXTREMITY MMT: 4+/5  LYMPHEDEMA ASSESSMENTS:   SURGERY TYPE/DATE: 07/09/2021 BILATERAL BREAST LUMPECTOMY WITH RADIOACTIVE SEED LOCALIZATION RADIOACTIVE SEED GUIDED RIGHT AXILLARY TARGETED LYMPH NODE DISSECTION LEFT AXILLARY SENTINEL NODE BIOPSY LEFT AXILLARY LYMPH NODE MAPPING WITH SENTIMAG  NUMBER OF LYMPH NODES REMOVED: Right 1/1, Left 0/1  CHEMOTHERAPY: neoadjuvant  RADIATION:yes bilaterally  HORMONE TREATMENT: yes  INFECTIONS: no  LYMPHEDEMA ASSESSMENTS:   LANDMARK RIGHT  eval  10 cm proximal to olecranon process 30.5  Olecranon process 27.1  10 cm  proximal to ulnar styloid process 21.2  Just proximal to ulnar styloid process 16.6  Across hand at thumb web space 21.4  At base of 2nd digit 6.8  (Blank rows = not tested)  LANDMARK LEFT  eval  10 cm proximal to olecranon process 30.7  Olecranon process 26.7  10 cm proximal to ulnar styloid process 20.5  Just proximal to ulnar styloid process 16.3  Across hand at thumb web space 21.1  At base of 2nd digit 6.7  (Blank rows = not tested)   FUNCTIONAL TESTS:   SLS ; 2-3 seconds ea leg GAIT: Distance walked: 20 Assistive device utilized: None Level of assistance: Complete Independence     QUICK DASH SURVEY: 77%,  03/31/2022: 31.82 BREAST COMPLAINTS SURVEY  :75  TODAY'S TREATMENT   05/20/2022 Practiced balance activities: tandem stance bilaterally x 2 ea, and SLS Bilaterally x 4 ea. After practice pt able to maintain SLS for a head count of 30. MGQ:[id number] performed short neck, 5 diaphragmatic breaths, activated right axillary and inguinal LN's, right axillo-inguinal pathway and right breast lateral to pathway, then medial right breast to lateral and pathway in supine  ending with LN's. Therapist demonstrated for pt and then pt performed each position multiple times.    05/18/2022 Pulleys flexion x 2 min, scaption x 1 min, abduction x 3 min OIZ:[id number] performed short neck, 5 diaphragmatic breaths, activated right axillary and inguinal LN's, right axillo-inguinal pathway and right breast lateral to pathway, then medial right breast to lateral and pathway in supine and Left SL ending with LN's. Emphasis on axilla and medial breast   05/10/2022 Pulleys x 2 min flexion and abduction, 1 min scaption, ball rolls forward on wall x 10  Scapular Retraction with Resistance  10 reps-  - Scapular Retraction with Resistance Advanced  10 reps-  - Shoulder External Rotation and Scapular Retraction with Resistance10  reps JYN:[id number] performed short neck, 5 diaphragmatic breaths,  activated right axillary and inguinal LN's, right axillo-inguinal pathway and right breast lateral to pathway, then medial right breast to lateral and pathway in supine and Left SL ending with LN's. Emphasis on axilla and medial breast      05/05/2022 Pulleys x 2 min flexion and abduction  QMV:[id number] performed short neck, 5 diaphragmatic breaths, activated right axillary and inguinal LN's, right axillo-inguinal pathway and right breast lateral to pathway, then medial right breast to lateral and pathway ending with LN's. Emphasis on axilla and medial breast   04/29/2022  MWU:[id number] performed short neck, 5 diaphragmatic breaths, activated right axillary and inguinal LN's, right axillo-inguinal pathway and right breast lateral to pathway, then medial right breast to lateral and pathway ending with LN's. Therapist then did the same steps but on the left breast. PROM bilateral shoulders flex, scaption ,abd MFR to right axillary area of cording AROM bilateral shoulder flexion, scaption, horizontal abd.   04/12/2022 Pt observed with increased swelling right axillary region with continued swelling at bilateral breast. Adjusted pts bra up to the last notch to try and put compression more under the arm. Therapist performed short neck, 5 diaphragmatic breaths, activated right axillary and inguinal LN's, right axillo-inguinal pathway and right breast lateral to pathway, then medial right breast to lateral and pathway. Pt was instructed in short neck, breaths, LN activation and pathway and was given written handout. Pt was found to have several painful cords in the right axillary region. MFR was performed to cords with arm in scaption. PROM with MFR was performed for right shoulder flexion and abd.  03/31/2022 Pulleys x 1:30 flex, scaption, abduction Supine wand flexion and scaption x 5 ea, star gazer x 5 Measured bilateral shoulder ROM and checked goals Made spaghetti foam pads for medial side  of bilateral breast where enlarged pores and fibrosis are present.  Marland Kitchen  PATIENT EDUCATION:  Education details: POC, Flexi touch, compression bra and gave script. (Pt in agreement with sending demo to [facility]), supine wand, Access Code: [email or web address] G URL: [email or web address] Date: 05/10/2022 Prepared by: Cheral Almas  Exercises - Scapular Retraction with Resistance  - 1 x daily - 7 x weekly - 1 sets - 10 reps - Scapular Retraction with Resistance Advanced  - 1 x daily - 7 x weekly - 1 sets - 10 reps - Shoulder External Rotation and Scapular Retraction with Resistance  - 1 x daily - 7 x weekly - 1 sets - 10 reps Person educated: Patient Education method: Explanation Education comprehension: verbalized understanding   HOME EXERCISE PROGRAM: Eval :None given today due to time  ASSESSMENT:  CLINICAL IMPRESSION: Pt got unfortunate news yesterday that she has multiple bone METS in her spine and ribs. She will be seeing Dr. Sondra Come today about radiation, and she will be starting a new drug which has to be approved. She did very well with balance activities today, and was greatly improved from her first visit. She had some difficulty with proper performance of right breast MLD but improved with VC's and TC's and was performing correctly by end of session.  OBJECTIVE IMPAIRMENTS decreased activity tolerance, decreased balance, decreased knowledge of condition, difficulty walking, decreased ROM, increased edema, increased fascial restrictions, impaired sensation, impaired UE functional use, postural dysfunction, and pain.   ACTIVITY LIMITATIONS carrying, lifting, sleeping, bathing, dressing, and reach over head  PARTICIPATION LIMITATIONS: cleaning, occupation, and yard work  PERSONAL FACTORS 3+ comorbidities: Bilateral breast Cancer with chemo/radiation, pain management,  are also affecting patient's functional outcome.   REHAB POTENTIAL: Good  CLINICAL DECISION MAKING:  Stable/uncomplicated  EVALUATION COMPLEXITY: Low  GOALS: Goals reviewed with patient? Yes  SHORT TERM GOALS: Target date: 06/10/2022    Pt will be independent and compliant with a HEP for shoulder ROM and strength Baseline: Goal status: MET 2.  Pt will have improved bilateral shoulder flexion and abd to atleast 135 Baseline:  Goal status: MET 03/31/2022  3. Pt will have Flexi touch trial for breast lymphedema In Progress:MET  LONG TERM GOALS: Target date: 07/01/2022    Pt will have bilateral shoulder flexion atleast 145 degrees and abduction atleast 160 degrees for improved reaching ability Baseline:  Goal status: MET left 03/31/2022, progressing Right MET Right 05/05/2022  2.  Pts quick dash will be improved to no greater than 25% for improved function Baseline: 33% today Goal status: in PROGRESS 3.  Pt will be independent in self MLD to decrease bilateral breast swelling Baseline:  Goal status: In Progress  4.  Pt will purchase a compression bra to decrease breast swelling Baseline:  Goal status: MET 5.  Pt will be able to dress, bathe and perform reaching activities with minimal pain. Baseline:  Goal status: MET 05/05/2022  6. Pt will receive flexitouch sequential compression pump and will be independent in its use to decrease bilateral breast edema.  Baseline:hasn't received  Goal status New 7. Pt will improve SLS to 10 sec each leg to demonstrate improved balance and decrease risk of falls  Baseline 2-3 sec each side  Goal status:NEW  PLAN: PT FREQUENCY: 2x/week  PT DURATION:   weeks  PLANNED INTERVENTIONS: Therapeutic exercises, Therapeutic activity, Neuromuscular re-education, Balance training, Patient/Family education, Self Care, Joint mobilization, Orthotic/Fit training, Manual lymph drainage, scar mobilization, Vasopneumatic device, Manual therapy, and Re-evaluation  PLAN FOR NEXT SESSION: pt to wear compression bra; , continue to review Bilateral breast  MLD.still awaiting Flexitouch appropval, ROM and STM prn.    Claris Pong, PT 05/20/2022, 9:06 AM

## 2022-05-20 NOTE — Telephone Encounter (Signed)
Oral Oncology Pharmacist Encounter  Received new prescription for abemaciclib (Verzenio) for the treatment of hormone receptor positive, HER2 negative, metastatic breast cancer in conjunction with anastrozole, planned duration until disease progression or unacceptable toxicity.  Labs from 05/18/22 assessed, no interventions needed. Prescription dose and frequency assessed.  Current medication list in Epic reviewed, no significant DDIs with Verzenio identified.   Evaluated chart and no patient barriers to medication adherence noted.   Patient agreement for treatment documented in MD note on 05/18/22.  Prescription has been e-scribed to the [facility] for benefits analysis and approval.  Oral Oncology Clinic will continue to follow for insurance authorization, copayment issues, initial counseling and start date.  Drema Halon, PharmD Hematology/Oncology Clinical Pharmacist [facility] [phone] 05/20/2022 11:47 AM

## 2022-05-20 NOTE — Telephone Encounter (Signed)
Oral Chemotherapy Pharmacist Encounter  I spoke with patient for overview of: Verzenio for the treatment of advanced, hormone-receptor positive breast cancer, in combination with anastrozole, planned duration until disease progression or unacceptable toxicity.   Counseled patient on administration, dosing, side effects, monitoring, drug-food interactions, safe handling, storage, and disposal.  Patient will take Verzenio '100mg'$  tablets, 1 tablet by mouth twice daily without regard to food.  Patient knows to avoid grapefruit and grapefruit juice.  Patient is taking anastrozole once daily.  Verzenio start date: 05/21/2022  Adverse effects include but are not limited to: diarrhea, fatigue, nausea, abdominal pain, decreased blood counts, and increased liver function tests, and joint pains. Severe, life-threatening, and/or fatal interstitial lung disease (ILD) and/or pneumonitis may occur with CDK 4/6 inhibitors.  Patient does not have anti-emetic on hand and knows to call the office for nausea medication it if nausea develops.   Patient will obtain anti diarrheal and alert the office of 4 or more loose stools above baseline.  Reviewed with patient importance of keeping a medication schedule and plan for any missed doses. No barriers to medication adherence identified.  Medication reconciliation performed and medication/allergy list updated.  Insurance authorization for [employer] has been obtained. Test claim at the pharmacy revealed copayment $0 for 1st fill of 28 days. Patient is going to pick up medication from [place].   Patient informed the pharmacy will reach out 5-7 days prior to needing next fill of Verzenio to coordinate continued medication acquisition to prevent break in therapy.  All questions answered.  Patient voiced understanding and appreciation.   Medication education handout placed in mail for patient. Patient knows to call the office with questions  or concerns. Oral Chemotherapy Clinic phone number provided to patient.   Drema Halon, PharmD Hematology/Oncology Clinical Pharmacist [facility] [phone] 05/20/2022   1:37 PM

## 2022-05-20 NOTE — Telephone Encounter (Signed)
Called patient to inform of Pet Scan for 06-02-22- arrival time- 8 am @ [facility], patient to have water only- 6 hrs. prior to test, lvm for a return call

## 2022-05-21 ENCOUNTER — Inpatient Hospital Stay: Payer: Medicaid Other | Attending: Adult Health | Admitting: Licensed Clinical Social Worker

## 2022-05-21 ENCOUNTER — Other Ambulatory Visit (HOSPITAL_COMMUNITY): Payer: Self-pay

## 2022-05-21 ENCOUNTER — Telehealth: Payer: Self-pay | Admitting: Pharmacist

## 2022-05-21 DIAGNOSIS — Z79899 Other long term (current) drug therapy: Secondary | ICD-10-CM | POA: Insufficient documentation

## 2022-05-21 DIAGNOSIS — R112 Nausea with vomiting, unspecified: Secondary | ICD-10-CM | POA: Insufficient documentation

## 2022-05-21 DIAGNOSIS — C50411 Malignant neoplasm of upper-outer quadrant of right female breast: Secondary | ICD-10-CM | POA: Insufficient documentation

## 2022-05-21 DIAGNOSIS — Z515 Encounter for palliative care: Secondary | ICD-10-CM | POA: Insufficient documentation

## 2022-05-21 DIAGNOSIS — K5900 Constipation, unspecified: Secondary | ICD-10-CM | POA: Insufficient documentation

## 2022-05-21 DIAGNOSIS — Z170 Estrogen receptor positive status [ER+]: Secondary | ICD-10-CM | POA: Insufficient documentation

## 2022-05-21 DIAGNOSIS — N179 Acute kidney failure, unspecified: Secondary | ICD-10-CM | POA: Insufficient documentation

## 2022-05-21 DIAGNOSIS — G893 Neoplasm related pain (acute) (chronic): Secondary | ICD-10-CM | POA: Insufficient documentation

## 2022-05-21 DIAGNOSIS — C7951 Secondary malignant neoplasm of bone: Secondary | ICD-10-CM | POA: Insufficient documentation

## 2022-05-21 DIAGNOSIS — Z79811 Long term (current) use of aromatase inhibitors: Secondary | ICD-10-CM | POA: Insufficient documentation

## 2022-05-21 DIAGNOSIS — E860 Dehydration: Secondary | ICD-10-CM | POA: Insufficient documentation

## 2022-05-21 DIAGNOSIS — C50412 Malignant neoplasm of upper-outer quadrant of left female breast: Secondary | ICD-10-CM | POA: Insufficient documentation

## 2022-05-21 NOTE — Telephone Encounter (Signed)
Oral Chemotherapy Pharmacist Encounter   Received notification from [facility] that the patient had questions about her medication. Returned call to patient.  The following was discussed with the patient: We discussed that packaging, patient was expecting a bottle and this medication is blister packed She just picked up he medication and was wondering about starting it. Because it is after 12pm I told her if she were to start today she would just take an evening dose or she can start tomorrow with both a morning and evening dose Patient had questions on the use of loperamide, I reviewed the prn administration instructions with her. She knows to call the office if she is having 4 or more loose stools She also had questions about handling the medication at home, reviewed with her the general handling instructions for these types of medications at home.   Patient stated her understanding of the above and knows to call if she has further questions.   Darl Pikes, PharmD, BCPS, BCOP, CPP Hematology/Oncology Clinical Pharmacist [place]/DB/AP Oral [facility] [phone]  05/21/2022 2:34 PM

## 2022-05-21 NOTE — Progress Notes (Signed)
Chelsea Hansen  Clinical Social Hansen was referred by medical provider for assessment of psychosocial needs.  Clinical Social Worker contacted patient by phone  to offer support and assess for needs.    Patient with recent diagnosis of metastases to bone. She is concerned financially as she will end up having hours reduced at [facility] for appointments and treatment (currently working at [employer]). She rents and has a roommate, but the roommate needs to move, so cost will be difficult. CSW discussed resources today and sent information on [employer] site. Pt will also go to [employer] for help identifying affordable housing. Pt does receive SNAP benefits  Pt wants to apply for disability again as well. CSW made referral to [facility] for assistance. Submitted application to [employer] for financial assistance. [place] application to patient.     [place], [place] Worker [employer]

## 2022-05-24 ENCOUNTER — Other Ambulatory Visit: Payer: Self-pay | Admitting: *Deleted

## 2022-05-24 ENCOUNTER — Inpatient Hospital Stay (HOSPITAL_BASED_OUTPATIENT_CLINIC_OR_DEPARTMENT_OTHER): Payer: Medicaid Other | Admitting: Adult Health

## 2022-05-24 ENCOUNTER — Other Ambulatory Visit: Payer: Self-pay

## 2022-05-24 ENCOUNTER — Inpatient Hospital Stay: Payer: Medicaid Other

## 2022-05-24 ENCOUNTER — Encounter: Payer: Self-pay | Admitting: Adult Health

## 2022-05-24 ENCOUNTER — Other Ambulatory Visit (HOSPITAL_COMMUNITY): Payer: Self-pay

## 2022-05-24 VITALS — BP 153/89 | HR 74 | Temp 97.9°F | Resp 18 | Ht 71.0 in | Wt 216.4 lb

## 2022-05-24 DIAGNOSIS — C7951 Secondary malignant neoplasm of bone: Secondary | ICD-10-CM | POA: Diagnosis not present

## 2022-05-24 DIAGNOSIS — G893 Neoplasm related pain (acute) (chronic): Secondary | ICD-10-CM | POA: Diagnosis not present

## 2022-05-24 DIAGNOSIS — C50411 Malignant neoplasm of upper-outer quadrant of right female breast: Secondary | ICD-10-CM

## 2022-05-24 DIAGNOSIS — K5900 Constipation, unspecified: Secondary | ICD-10-CM | POA: Diagnosis not present

## 2022-05-24 DIAGNOSIS — Z170 Estrogen receptor positive status [ER+]: Secondary | ICD-10-CM

## 2022-05-24 DIAGNOSIS — R112 Nausea with vomiting, unspecified: Secondary | ICD-10-CM | POA: Diagnosis not present

## 2022-05-24 DIAGNOSIS — E860 Dehydration: Secondary | ICD-10-CM | POA: Diagnosis not present

## 2022-05-24 DIAGNOSIS — Z79899 Other long term (current) drug therapy: Secondary | ICD-10-CM | POA: Diagnosis not present

## 2022-05-24 DIAGNOSIS — C50412 Malignant neoplasm of upper-outer quadrant of left female breast: Secondary | ICD-10-CM | POA: Diagnosis present

## 2022-05-24 DIAGNOSIS — N179 Acute kidney failure, unspecified: Secondary | ICD-10-CM | POA: Diagnosis not present

## 2022-05-24 DIAGNOSIS — Z79811 Long term (current) use of aromatase inhibitors: Secondary | ICD-10-CM | POA: Diagnosis not present

## 2022-05-24 DIAGNOSIS — Z515 Encounter for palliative care: Secondary | ICD-10-CM | POA: Diagnosis not present

## 2022-05-24 LAB — MAGNESIUM: Magnesium: 2 mg/dL (ref 1.7–2.4)

## 2022-05-24 LAB — CMP (CANCER CENTER ONLY)
ALT: 16 U/L (ref 0–44)
AST: 16 U/L (ref 15–41)
Albumin: 4.4 g/dL (ref 3.5–5.0)
Alkaline Phosphatase: 95 U/L (ref 38–126)
Anion gap: 6 (ref 5–15)
BUN: 16 mg/dL (ref 6–20)
CO2: 32 mmol/L (ref 22–32)
Calcium: 10.1 mg/dL (ref 8.9–10.3)
Chloride: 102 mmol/L (ref 98–111)
Creatinine: 1.16 mg/dL — ABNORMAL HIGH (ref 0.44–1.00)
GFR, Estimated: 57 mL/min — ABNORMAL LOW (ref >60)
Glucose, Bld: 100 mg/dL — ABNORMAL HIGH (ref 70–99)
Potassium: 4.1 mmol/L (ref 3.5–5.1)
Sodium: 140 mmol/L (ref 135–145)
Total Bilirubin: 0.4 mg/dL (ref 0.3–1.2)
Total Protein: 7.8 g/dL (ref 6.5–8.1)

## 2022-05-24 LAB — CBC WITH DIFFERENTIAL (CANCER CENTER ONLY)
Abs Immature Granulocytes: 0.01 10*3/uL (ref 0.00–0.07)
Basophils Absolute: 0 10*3/uL (ref 0.0–0.1)
Basophils Relative: 1 %
Eosinophils Absolute: 0.1 10*3/uL (ref 0.0–0.5)
Eosinophils Relative: 1 %
HCT: 37.7 % (ref 36.0–46.0)
Hemoglobin: 11.9 g/dL — ABNORMAL LOW (ref 12.0–15.0)
Immature Granulocytes: 0 %
Lymphocytes Relative: 22 %
Lymphs Abs: 1.4 10*3/uL (ref 0.7–4.0)
MCH: 23.8 pg — ABNORMAL LOW (ref 26.0–34.0)
MCHC: 31.6 g/dL (ref 30.0–36.0)
MCV: 75.6 fL — ABNORMAL LOW (ref 80.0–100.0)
Monocytes Absolute: 0.5 10*3/uL (ref 0.1–1.0)
Monocytes Relative: 8 %
Neutro Abs: 4.1 10*3/uL (ref 1.7–7.7)
Neutrophils Relative %: 68 %
Platelet Count: 310 10*3/uL (ref 150–400)
RBC: 4.99 MIL/uL (ref 3.87–5.11)
RDW: 15.9 % — ABNORMAL HIGH (ref 11.5–15.5)
WBC Count: 6.1 10*3/uL (ref 4.0–10.5)
nRBC: 0 % (ref 0.0–0.2)

## 2022-05-24 MED ORDER — SODIUM CHLORIDE 0.9 % IV SOLN: 8.0000 mg | Freq: Once | INTRAVENOUS | Status: DC

## 2022-05-24 MED ORDER — PANTOPRAZOLE SODIUM 40 MG PO TBEC
40.0000 mg | DELAYED_RELEASE_TABLET | Freq: Every day | ORAL | 0 refills | Status: DC
  Filled 2022-05-24: qty 30, 30d supply, fill #0

## 2022-05-24 MED ORDER — FAMOTIDINE IN NACL 20-0.9 MG/50ML-% IV SOLN
20.0000 mg | Freq: Once | INTRAVENOUS | Status: AC
  Administered 2022-05-24: 20 mg via INTRAVENOUS
  Filled 2022-05-24: qty 50

## 2022-05-24 MED ORDER — SODIUM CHLORIDE 0.9 % IV SOLN: INTRAVENOUS | Status: DC

## 2022-05-24 MED ORDER — ABEMACICLIB 50 MG PO TABS
50.0000 mg | ORAL_TABLET | Freq: Two times a day (BID) | ORAL | 3 refills | Status: DC
  Filled 2022-05-24: qty 70, 35d supply, fill #0
  Filled 2022-05-25: qty 56, 28d supply, fill #0
  Filled 2022-06-10 (×2): qty 56, 28d supply, fill #1
  Filled 2022-07-08: qty 56, 28d supply, fill #2
  Filled 2022-08-06 – 2022-08-11 (×2): qty 56, 28d supply, fill #3

## 2022-05-24 MED ORDER — ONDANSETRON HCL 4 MG/2ML IJ SOLN
8.0000 mg | Freq: Once | INTRAMUSCULAR | Status: AC
  Administered 2022-05-24: 8 mg via INTRAVENOUS
  Filled 2022-05-24: qty 4

## 2022-05-24 MED ORDER — ONDANSETRON 8 MG PO TBDP
8.0000 mg | ORAL_TABLET | Freq: Three times a day (TID) | ORAL | 0 refills | Status: DC | PRN
  Filled 2022-05-24: qty 20, 7d supply, fill #0

## 2022-05-24 MED ORDER — PROCHLORPERAZINE MALEATE 10 MG PO TABS
10.0000 mg | ORAL_TABLET | Freq: Four times a day (QID) | ORAL | 0 refills | Status: DC | PRN
  Filled 2022-05-24: qty 30, 8d supply, fill #0

## 2022-05-24 NOTE — Progress Notes (Signed)
Received call from pt with complaint of ongoing severe vomiting x3 days.  Pt states nausea and vomiting started as soon as she started taking Verzenio p.o.  Per MD pt needing to have labs and [facility] visit today for further evaluation. Appt scheduled, pt educated and verbalized understanding.

## 2022-05-24 NOTE — Assessment & Plan Note (Signed)
Chelsea Hansen is a 51 year old woman with newly discovered metastatic breast cancer to the bone here today for follow-up and evaluation for symptom management while on anastrozole and recently started Verzenio.  She will continue on her anastrozole daily however she will stop Verzenio secondary to the dehydration from the nausea and vomiting related to the medication.  Due to her dehydration she will receive IV fluids daily for 3 days along with IV Zofran and IV famotidine.  I also sent in Compazine and Zofran as needed and Protonix daily for her to take.  We discussed her dehydration and the slight acute kidney injury due to her nausea and vomiting.  While receiving IV fluids daily for 3 days on Wednesday when she comes in we will repeat her labs to see if her kidney function has improved.  She will also return in 1 week for labs and follow-up.  At that time so long as she is feeling better she can start the Verzenio at a lower dose.  I typed all this information out for her and her after visit summary in addition to some patient instructions around dehydration and signs of when to go to the ER.  She verbalized understanding of the above, and knows to call for any questions or concerns.

## 2022-05-24 NOTE — Patient Instructions (Signed)
I sent in Prochlorperazine and Ondansetron for your nausea.  These can be alternated to help improve any persistent nausea you may have from the [place].  Do not take any more Verzenio for the time being.    Your kidney function is slightly increased from your body not getting enough fluids since you have been vomiting.  I recommend that you receive IV fluids today, tomorrow, and Wednesday to help.  I want to recheck your labs later this week.   We will also see you back next week for labs and follow up to see how you are doing and to start you on a lower dose of the Verzenio.    I sent in some Protonix which will help neutralize the acid in your stomach and decrease any irritation.    Dehydration, Adult Dehydration is condition in which there is not enough water or other fluids in the body. This happens when a person loses more fluids than he or she takes in. Important body parts cannot work right without the right amount of fluids. Any loss of fluids from the body can cause dehydration. Dehydration can be mild, worse, or very bad. It should be treated right away to keep it from getting very bad. What are the causes? This condition may be caused by: Conditions that cause loss of water or other fluids, such as: Watery poop (diarrhea). Vomiting. Sweating a lot. Peeing (urinating) a lot. Not drinking enough fluids, especially when you: Are ill. Are doing things that take a lot of energy to do. Other illnesses and conditions, such as fever or infection. Certain medicines, such as medicines that take extra fluid out of the body (diuretics). Lack of safe drinking water. Not being able to get enough water and food. What increases the risk? The following factors may make you more likely to develop this condition: Having a long-term (chronic) illness that has not been treated the right way, such as: Diabetes. Heart disease. Kidney disease. Being 50 years of age or older. Having a  disability. Living in a place that is high above the ground or sea (high in altitude). The thinner, dried air causes more fluid loss. Doing exercises that put stress on your body for a long time. What are the signs or symptoms? Symptoms of dehydration depend on how bad it is. Mild or worse dehydration Thirst. Dry lips or dry mouth. Feeling dizzy or light-headed, especially when you stand up from sitting. Muscle cramps. Your body making: Dark pee (urine). Pee may be the color of tea. Less pee than normal. Less tears than normal. Headache. Very bad dehydration Changes in skin. Skin may: Be cold to the touch (clammy). Be blotchy or pale. Not go back to normal right after you lightly pinch it and let it go. Little or no tears, pee, or sweat. Changes in vital signs, such as: Fast breathing. Low blood pressure. Weak pulse. Pulse that is more than 100 beats a minute when you are sitting still. Other changes, such as: Feeling very thirsty. Eyes that look hollow (sunken). Cold hands and feet. Being mixed up (confused). Being very tired (lethargic) or having trouble waking from sleep. Short-term weight loss. Loss of consciousness. How is this treated? Treatment for this condition depends on how bad it is. Treatment should start right away. Do not wait until your condition gets very bad. Very bad dehydration is an emergency. You will need to go to a hospital. Mild or worse dehydration can be treated at home. You may  be asked to: Drink more fluids. Drink an oral rehydration solution (ORS). This drink helps get the right amounts of fluids and salts and minerals in the blood (electrolytes). Very bad dehydration can be treated: With fluids through an IV tube. By getting normal levels of salts and minerals in your blood. This is often done by giving salts and minerals through a tube. The tube is passed through your nose and into your stomach. By treating the root cause. Follow these  instructions at home: Oral rehydration solution If told by your doctor, drink an ORS: Make an ORS. Use instructions on the package. Start by drinking small amounts, about  cup (120 mL) every 5-10 minutes. Slowly drink more until you have had the amount that your doctor said to have. Eating and drinking        Drink enough clear fluid to keep your pee pale yellow. If you were told to drink an ORS, finish the ORS first. Then, start slowly drinking other clear fluids. Drink fluids such as: Water. Do not drink only water. Doing that can make the salt (sodium) level in your body get too low. Water from ice chips you suck on. Fruit juice that you have added water to (diluted). Low-calorie sports drinks. Eat foods that have the right amounts of salts and minerals, such as: Bananas. Oranges. Potatoes. Tomatoes. Spinach. Do not drink alcohol. Avoid: Drinks that have a lot of sugar. These include: High-calorie sports drinks. Fruit juice that you did not add water to. Soda. Caffeine. Foods that are greasy or have a lot of fat or sugar. General instructions Take over-the-counter and prescription medicines only as told by your doctor. Do not take salt tablets. Doing that can make the salt level in your body get too high. Return to your normal activities as told by your doctor. Ask your doctor what activities are safe for you. Keep all follow-up visits as told by your doctor. This is important. Contact a doctor if: You have pain in your belly (abdomen) and the pain: Gets worse. Stays in one place. You have a rash. You have a stiff neck. You get angry or annoyed (irritable) more easily than normal. You are more tired or have a harder time waking than normal. You feel: Weak or dizzy. Very thirsty. Get help right away if you have: Any symptoms of very bad dehydration. Symptoms of vomiting, such as: You cannot eat or drink without vomiting. Your vomiting gets worse or does not go  away. Your vomit has blood or green stuff in it. Symptoms that get worse with treatment. A fever. A very bad headache. Problems with peeing or pooping (having a bowel movement), such as: Watery poop that gets worse or does not go away. Blood in your poop (stool). This may cause poop to look black and tarry. Not peeing in 6-8 hours. Peeing only a small amount of very dark pee in 6-8 hours. Trouble breathing. These symptoms may be an emergency. Do not wait to see if the symptoms will go away. Get medical help right away. Call your local emergency services (911 in the [place] Do not drive yourself to the hospital. Summary Dehydration is a condition in which there is not enough water or other fluids in the body. This happens when a person loses more fluids than he or she takes in. Treatment for this condition depends on how bad it is. Treatment should be started right away. Do not wait until your condition gets very bad. Drink enough  clear fluid to keep your pee pale yellow. If you were told to drink an oral rehydration solution (ORS), finish the ORS first. Then, start slowly drinking other clear fluids. Take over-the-counter and prescription medicines only as told by your doctor. Get help right away if you have any symptoms of very bad dehydration. This information is not intended to replace advice given to you by your health care provider. Make sure you discuss any questions you have with your health care provider. Document Revised: 10/14/2021 Document Reviewed: 01/18/2019 [employer] Patient Education  [place].

## 2022-05-24 NOTE — Progress Notes (Signed)
Orders placed for 1L IVF over 2 hrs per NP. Today she will receive Zofran 8 mg IV and Pepcid 20 MG IV, per NP. Orders entered for IVF next 3 days. Scheduling will be in touch to schedule appts.

## 2022-05-24 NOTE — Patient Instructions (Signed)

## 2022-05-24 NOTE — Progress Notes (Signed)
[facility] Follow up:    Chelsea Perna, NP [address] [place] [address]   DIAGNOSIS:  Cancer Staging  Malignant neoplasm of upper-outer quadrant of left breast in female, estrogen receptor positive ([place]) Staging form: Breast, AJCC 8th Edition - Clinical stage from 12/17/2020: Stage IA (cT1b, cN0, cM0, G2, ER+, PR+, HER2-) - Signed by [name], NP on 01/14/2022 Stage prefix: Initial diagnosis Histologic grading system: 3 grade system Laterality: Left Staged by: Pathologist and managing physician Stage used in treatment planning: Yes National guidelines used in treatment planning: Yes Type of national guideline used in treatment planning: NCCN - Pathologic stage from 07/09/2021: No Stage Recommended (ypTis (DCIS), pN0, cM0) - Signed by [name], NP on 07/22/2021 Stage prefix: Post-therapy  Malignant neoplasm of upper-outer quadrant of right breast in female, estrogen receptor positive ([place]) Staging form: Breast, AJCC 8th Edition - Clinical stage from 12/17/2020: Stage IIA (cT2, cN1, cM0, G2, ER+, PR+, HER2-) - Signed by [name], NP on 01/14/2022 Stage prefix: Initial diagnosis Histologic grading system: 3 grade system Laterality: Right Staged by: Pathologist and managing physician Stage used in treatment planning: Yes National guidelines used in treatment planning: Yes Type of national guideline used in treatment planning: NCCN - Pathologic stage from 07/09/2021: No Stage Recommended (ypT2, pN1a, cM0, G2) - Signed by [name], NP on 07/22/2021 Stage prefix: Post-therapy Histologic grading system: 3 grade system   SUMMARY OF ONCOLOGIC HISTORY: Oncology History  Malignant neoplasm of upper-outer quadrant of right breast in female, estrogen receptor positive ([place])  12/12/2020 Initial Diagnosis   status post bilateral breast biopsies 12/03/2020, showing             (1) on the right, a  clinical T2 N1, stage IIa invasive lobular carcinoma, grade 2, estrogen and progesterone receptor strongly positive, HER2 not amplified, with an MIB-1 of 40%                         (a) the biopsied right axillary lymph node was positive with extracapsular extension                         (b) a second right breast mass also biopsied was a fibroadenoma, concordant  MAMMAPRINT tested on biopsy returned high risk, luminal type B, indicating significant benefit from chemo                         (c) biopsy of an area of non-mass-like enhancement in the upper right breast pending   12/17/2020 Cancer Staging   Staging form: Breast, AJCC 8th Edition - Clinical stage from 12/17/2020: Stage IIA (cT2, cN1, cM0, G2, ER+, PR+, HER2-) - Signed by [name], NP on 01/14/2022 Stage prefix: Initial diagnosis Histologic grading system: 3 grade system Laterality: Right Staged by: Pathologist and managing physician Stage used in treatment planning: Yes National guidelines used in treatment planning: Yes Type of national guideline used in treatment planning: NCCN   12/24/2020 Genetic Testing   Negative genetic testing:  No pathogenic variants detected on the [employer] BRCAplus panel (report date 12/24/2020) or the CancerNext-Expanded + RNAinsight panel (report date 12/31/2020). 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   The BRCAplus panel offered by [employer] and includes sequencing and deletion/duplication analysis for the following 8 genes: ATM, BRCA1, BRCA2, CDH1, CHEK2,  PALB2, PTEN, and TP53. The CancerNext-Expanded + RNAinsight gene panel offered by [employer] and includes sequencing and rearrangement analysis for the following 77 genes: AIP, ALK, APC, ATM, AXIN2, BAP1, BARD1, BLM, BMPR1A, BRCA1, BRCA2, BRIP1, CDC73, CDH1, CDK4, CDKN1B, CDKN2A, CHEK2, CTNNA1, DICER1, FANCC, FH, FLCN, GALNT12, KIF1B, LZTR1, MAX, MEN1, MET, MLH1, MSH2, MSH3,  MSH6, MUTYH, NBN, NF1, NF2, NTHL1, PALB2, PHOX2B, PMS2, POT1, PRKAR1A, PTCH1, PTEN, RAD51C, RAD51D, RB1, RECQL, RET, SDHA, SDHAF2, SDHB, SDHC, SDHD, SMAD4, SMARCA4, SMARCB1, SMARCE1, STK11, SUFU, TMEM127, TP53, TSC1, TSC2, VHL and XRCC2 (sequencing and deletion/duplication); EGFR, EGLN1, HOXB13, KIT, MITF, PDGFRA, POLD1 and POLE (sequencing only); EPCAM and GREM1 (deletion/duplication only). RNA data is routinely analyzed for use in variant interpretation for all genes.   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 Multifocal sclerotic bony metastatic disease greatest in the thoracic spine with scattered rib lesions and sclerosis as well. 2. No nodal or visceral metastasis about the chest. 3. Collateral pathways about the chest suggest some subclavian venous narrowing on the LEFT. This could also be related to arm position. Correlate clinically. 4. Signs of RIGHT breast lumpectomy. Potential postoperative changes in the LEFT breast with small focal area of  nodularity in the lateral LEFT breast which could also be postoperative, correlate clinically and consider mammographic correlation as warranted. 5. Mild cardiac enlargement.   05/18/2022 Treatment Plan Change   Anastrozole + Verzenio 181m PO BID   Malignant neoplasm of upper-outer quadrant of left breast in female, estrogen receptor positive ([place]  12/12/2020 Initial Diagnosis   on the left, a clinical T1b N0, stage IA invasive ductal carcinoma, grade 1 or 2, estrogen and progesterone receptor positive, HER2 not amplified, with an MIB 1 of 15%   12/17/2020 Cancer Staging   Staging form: Breast, AJCC 8th Edition - Clinical stage from 12/17/2020: Stage IA (cT1b, cN0, cM0, G2, ER+, PR+, HER2-) - Signed by CGardenia Phlegm NP on 01/14/2022 Stage prefix: Initial diagnosis Histologic grading system: 3 grade system Laterality: Left Staged by: Pathologist and managing physician Stage used in treatment planning: Yes National guidelines used in treatment planning: Yes Type of national guideline used in treatment planning: NCCN   12/24/2020 Genetic Testing   Negative genetic testing:  No pathogenic variants detected on the [facility] BRCAplus panel (report date 12/24/2020) or the CancerNext-Expanded + RNAinsight panel (report date 12/31/2020). 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   The BRCAplus panel offered by [employer] includes sequencing and deletion/duplication analysis for the following 8 genes: ATM, BRCA1, BRCA2, CDH1,  CHEK2, PALB2, PTEN, and TP53. The CancerNext-Expanded + RNAinsight gene panel offered by [employer] and includes sequencing and rearrangement analysis for the following 77 genes: AIP, ALK, APC, ATM, AXIN2, BAP1, BARD1, BLM, BMPR1A, BRCA1, BRCA2, BRIP1, CDC73, CDH1, CDK4, CDKN1B, CDKN2A, CHEK2, CTNNA1, DICER1, FANCC, FH, FLCN, GALNT12, KIF1B, LZTR1, MAX, MEN1, MET, MLH1, MSH2, MSH3, MSH6, MUTYH, NBN, NF1, NF2,  NTHL1, PALB2, PHOX2B, PMS2, POT1, PRKAR1A, PTCH1, PTEN, RAD51C, RAD51D, RB1, RECQL, RET, SDHA, SDHAF2, SDHB, SDHC, SDHD, SMAD4, SMARCA4, SMARCB1, SMARCE1, STK11, SUFU, TMEM127, TP53, TSC1, TSC2, VHL and XRCC2 (sequencing and deletion/duplication); EGFR, EGLN1, HOXB13, KIT, MITF, PDGFRA, POLD1 and POLE (sequencing only); EPCAM and GREM1 (deletion/duplication only). RNA data is routinely analyzed for use in variant interpretation for all genes.   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 Multifocal sclerotic bony metastatic disease greatest in the thoracic spine with scattered rib lesions and sclerosis as well. 2. No nodal or visceral metastasis about the chest. 3. Collateral pathways about the chest suggest some subclavian venous narrowing on the LEFT. This could also be related to arm position. Correlate clinically. 4. Signs of RIGHT breast lumpectomy. Potential postoperative changes in the LEFT breast with small focal area of nodularity in the lateral LEFT breast which could also be  postoperative, correlate clinically and consider mammographic correlation as warranted. 5. Mild cardiac enlargement.   05/18/2022 Treatment Plan Change   Anastrozole + Verzenio 160m PO BID     CURRENT THERAPY: Anastrozole and Verzenio  INTERVAL HISTORY: Chelsea LORINO89y.o. female returns for persistent nausea and vomiting that began a few days ago when she started taking Verzenio.  She does not have any antinausea medications and has continued to take Verzenio.  She is feeling very poorly today because of the amount of vomiting that she has had and she has been unable to eat or drink anything the past few days.  She has some loose bowel movements but denies any significant diarrhea or multiple episodes of diarrhea.   Patient Active Problem List   Diagnosis Date Noted   Metastasis to bone ([place] 05/20/2022   PSVT (paroxysmal supraventricular tachycardia) 07/08/2021   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   GERD (gastroesophageal reflux disease) 05/27/2021   Hyperlipidemia 05/27/2021   Hypertension 05/27/2021   Pain management contract signed 05/04/2021   Chronic pain syndrome (breast cancer) 05/04/2021   Palpitations 03/25/2021   Sinus bradycardia 03/25/2021   Daytime somnolence 03/25/2021   Snoring 03/25/2021   Obesity (BMI 30-39.9) 03/25/2021   Port-A-Cath in place 01/27/2021   Genetic testing 12/24/2020   Family history of uterine cancer 12/17/2020  Malignant neoplasm of upper-outer quadrant of right breast in female, estrogen receptor positive ([place]) 12/12/2020   Malignant neoplasm of upper-outer quadrant of left breast in female, estrogen receptor positive ([place]) 12/12/2020   Hypokalemia 01/02/2012   Asthma 01/01/2012   Bradycardia 01/01/2012   Syncope 12/31/2011   Mediastinal adenopathy 12/31/2011   Anemia 12/31/2011   Chest pain 12/31/2011    has No Known Allergies.  MEDICAL HISTORY: Past Medical History:  Diagnosis Date   Allergy    seasonal allergies    Anxiety    on meds   Asthma    uses inhaler   Breast cancer ([place]) 2012   RIGHT lumpectomy   Cancer ([place]) 2022   RIGHT breast lump-dx 2022   Depression    on meds   DVT (deep venous thrombosis) ([place]) 2010   after hysterectomy   Family history of uterine cancer    GERD (gastroesophageal reflux disease)    with certain foods/OTC PRN meds   Headache(784.0)    Hyperlipidemia    on meds   Hypertension    on meds   SVT (supraventricular tachycardia)     SURGICAL HISTORY: Past Surgical History:  Procedure Laterality Date   ABDOMINAL HYSTERECTOMY     AXILLARY SENTINEL NODE BIOPSY Left 07/09/2021   Procedure: LEFT AXILLARY SENTINEL NODE BIOPSY;  Surgeon: [name], MD;  Location: [place] [place];  Service: General;  Laterality: Left;   BREAST EXCISIONAL BIOPSY Right 09/16/2009   BREAST LUMPECTOMY WITH RADIOACTIVE SEED LOCALIZATION Bilateral 07/09/2021   Procedure: BILATERAL BREAST LUMPECTOMY WITH RADIOACTIVE SEED LOCALIZATION;  Surgeon: [name], MD;  Location: [facility];  Service: General;  Laterality: Bilateral;   BREAST SURGERY     lumpectomy   PORT-A-CATH REMOVAL Left 07/09/2021   Procedure: REMOVAL PORT-A-CATH;  Surgeon: [name], MD;  Location: [facility];  Service: General;  Laterality: Left;   PORTACATH PLACEMENT Left 01/26/2021   Procedure: INSERTION PORT-A-CATH;  Surgeon: [name], MD;  Location: [facility];  Service: General;  Laterality: Left;   RADIOACTIVE SEED GUIDED AXILLARY SENTINEL LYMPH NODE Right 07/09/2021   Procedure: RADIOACTIVE SEED GUIDED RIGHT AXILLARY SENTINEL LYMPH NODE DISSECTION;  Surgeon: [name], MD;  Location: [facility];  Service: General;  Laterality: Right;   TUBAL LIGATION      SOCIAL HISTORY: Social History   Socioeconomic History   Marital status: Single    Spouse name: Not on file   Number of children: Not on file   Years of education: Not on  file   Highest education level: Not on file  Occupational History   Not on file  Tobacco Use   Smoking status: Never   Smokeless tobacco: Never  Vaping Use   Vaping Use: Never used  Substance and Sexual Activity   Alcohol use: Not Currently    Alcohol/week: 7.0 standard drinks of alcohol    Types: 7 Standard drinks or equivalent per week    Comment: occassional   Drug use: No   Sexual activity: Not Currently  Other Topics Concern   Not on file  Social History Narrative   Not on file   Social Determinants of Health   Financial Resource Strain: High Risk (01/14/2022)   Overall Financial Resource Strain (CARDIA)    Difficulty of Paying Living Expenses: Very hard  Food Insecurity: Food Insecurity Present (01/14/2022)   Hunger Vital Sign    Worried About Running Out of Food in the Last Year: Sometimes true  Ran Out of Food in the Last Year: Sometimes true  Transportation Needs: No Transportation Needs (12/02/2020)   PRAPARE - Hydrologist (Medical): No    Lack of Transportation (Non-Medical): No  Physical Activity: Not on file  Stress: Not on file  Social Connections: Not on file  Intimate Partner Violence: Not on file    FAMILY HISTORY: Family History  Problem Relation Age of Onset   Breast cancer Mother        25s   Hypertension Father    Uterine cancer Maternal Aunt    Ovarian cancer Maternal Aunt    Breast cancer Maternal Aunt        20s   Breast cancer Cousin 69   Uterine cancer Cousin 42   Colon polyps Neg Hx    Colon cancer Neg Hx    Esophageal cancer Neg Hx    Stomach cancer Neg Hx     Review of Systems  Constitutional:  Positive for fatigue. Negative for appetite change, chills, fever and unexpected weight change.  HENT:   Negative for hearing loss, lump/mass and trouble swallowing.   Eyes:  Negative for eye problems and icterus.  Respiratory:  Negative for chest tightness, cough and shortness of breath.   Cardiovascular:   Negative for chest pain, leg swelling and palpitations.  Gastrointestinal:  Positive for nausea and vomiting. Negative for abdominal distention, abdominal pain, constipation and diarrhea.  Endocrine: Negative for hot flashes.  Genitourinary:  Negative for difficulty urinating.   Musculoskeletal:  Negative for arthralgias.  Skin:  Negative for itching and rash.  Neurological:  Positive for headaches. Negative for dizziness, extremity weakness, light-headedness and numbness.  Hematological:  Negative for adenopathy. Does not bruise/bleed easily.  Psychiatric/Behavioral:  Negative for depression. The patient is not nervous/anxious.       PHYSICAL EXAMINATION  ECOG PERFORMANCE STATUS: 2 - Symptomatic, <50% confined to bed  Vitals:   05/24/22 1219  BP: (!) 153/89  Pulse: 74  Resp: 18  Temp: 97.9 F (36.6 C)  SpO2: 100%    Physical Exam Constitutional:      General: She is not in acute distress.    Appearance: Normal appearance. She is ill-appearing. She is not toxic-appearing.  HENT:     Head: Normocephalic and atraumatic.     Mouth/Throat:     Mouth: Mucous membranes are dry.  Eyes:     General: No scleral icterus. Cardiovascular:     Rate and Rhythm: Normal rate and regular rhythm.     Pulses: Normal pulses.     Heart sounds: Normal heart sounds.  Pulmonary:     Effort: Pulmonary effort is normal.     Breath sounds: Normal breath sounds.  Abdominal:     General: Abdomen is flat. Bowel sounds are normal. There is no distension.     Palpations: Abdomen is soft.     Tenderness: There is no abdominal tenderness.  Musculoskeletal:        General: No swelling.     Cervical back: Neck supple.  Lymphadenopathy:     Cervical: No cervical adenopathy.  Skin:    General: Skin is warm and dry.     Findings: No rash.  Neurological:     General: No focal deficit present.     Mental Status: She is alert.  Psychiatric:        Mood and Affect: Mood normal.        Behavior:  Behavior normal.  LABORATORY DATA:  CBC    Component Value Date/Time   WBC 6.1 05/24/2022 1206   WBC 3.6 (L) 06/08/2021 0817   RBC 4.99 05/24/2022 1206   HGB 11.9 (L) 05/24/2022 1206   HGB 11.5 10/06/2020 1432   HCT 37.7 05/24/2022 1206   HCT 36.8 10/06/2020 1432   PLT 310 05/24/2022 1206   PLT 323 10/06/2020 1432   MCV 75.6 (L) 05/24/2022 1206   MCV 73 (L) 10/06/2020 1432   MCH 23.8 (L) 05/24/2022 1206   MCHC 31.6 05/24/2022 1206   RDW 15.9 (H) 05/24/2022 1206   RDW 15.9 (H) 10/06/2020 1432   LYMPHSABS 1.4 05/24/2022 1206   LYMPHSABS 1.9 10/06/2020 1432   MONOABS 0.5 05/24/2022 1206   EOSABS 0.1 05/24/2022 1206   EOSABS 0.1 10/06/2020 1432   BASOSABS 0.0 05/24/2022 1206   BASOSABS 0.0 10/06/2020 1432    CMP     Component Value Date/Time   NA 140 05/24/2022 1206   NA 139 10/06/2020 1432   K 4.1 05/24/2022 1206   CL 102 05/24/2022 1206   CO2 32 05/24/2022 1206   GLUCOSE 100 (H) 05/24/2022 1206   BUN 16 05/24/2022 1206   BUN 11 10/06/2020 1432   CREATININE 1.16 (H) 05/24/2022 1206   CALCIUM 10.1 05/24/2022 1206   PROT 7.8 05/24/2022 1206   PROT 6.9 10/06/2020 1432   ALBUMIN 4.4 05/24/2022 1206   ALBUMIN 4.1 10/06/2020 1432   AST 16 05/24/2022 1206   ALT 16 05/24/2022 1206   ALKPHOS 95 05/24/2022 1206   BILITOT 0.4 05/24/2022 1206   GFRNONAA 57 (L) 05/24/2022 1206   GFRAA >60 09/07/2019 1340       ASSESSMENT and THERAPY PLAN:   Metastasis to bone ([place]) Chelsea Hansen is a 51 year old woman with newly discovered metastatic breast cancer to the bone here today for follow-up and evaluation for symptom management while on anastrozole and recently started Verzenio.  She will continue on her anastrozole daily however she will stop Verzenio secondary to the dehydration from the nausea and vomiting related to the medication.  Due to her dehydration she will receive IV fluids daily for 3 days along with IV Zofran and IV famotidine.  I also sent in Compazine and  Zofran as needed and Protonix daily for her to take.  We discussed her dehydration and the slight acute kidney injury due to her nausea and vomiting.  While receiving IV fluids daily for 3 days on Wednesday when she comes in we will repeat her labs to see if her kidney function has improved.  She will also return in 1 week for labs and follow-up.  At that time so long as she is feeling better she can start the Verzenio at a lower dose.  I typed all this information out for her and her after visit summary in addition to some patient instructions around dehydration and signs of when to go to the ER.  She verbalized understanding of the above, and knows to call for any questions or concerns.   All questions were answered. The patient knows to call the clinic with any problems, questions or concerns. We can certainly see the patient much sooner if necessary.  Total encounter time:40 minutes*in face-to-face visit time, chart review, lab review, care coordination, order entry, and documentation of the encounter time.    Wilber Bihari, NP 05/24/22 1:31 PM Medical Oncology and Hematology [facility] [place], [place] [address] Tel. [phone]    Fax. [phone]  *  Total Encounter Time as defined by the Centers for Medicare and Medicaid Services includes, in addition to the face-to-face time of a patient visit (documented in the note above) non-face-to-face time: obtaining and reviewing outside history, ordering and reviewing medications, tests or procedures, care coordination (communications with other health care professionals or caregivers) and documentation in the medical record.

## 2022-05-25 ENCOUNTER — Inpatient Hospital Stay: Payer: Medicaid Other | Admitting: Licensed Clinical Social Worker

## 2022-05-25 ENCOUNTER — Other Ambulatory Visit: Payer: Self-pay | Admitting: *Deleted

## 2022-05-25 ENCOUNTER — Ambulatory Visit: Payer: Medicaid Other

## 2022-05-25 ENCOUNTER — Inpatient Hospital Stay: Payer: Medicaid Other

## 2022-05-25 ENCOUNTER — Ambulatory Visit (HOSPITAL_BASED_OUTPATIENT_CLINIC_OR_DEPARTMENT_OTHER): Payer: Medicaid Other | Admitting: Physician Assistant

## 2022-05-25 ENCOUNTER — Other Ambulatory Visit (HOSPITAL_COMMUNITY): Payer: Self-pay

## 2022-05-25 VITALS — BP 143/82 | HR 54 | Temp 97.8°F | Resp 18

## 2022-05-25 DIAGNOSIS — Z170 Estrogen receptor positive status [ER+]: Secondary | ICD-10-CM | POA: Diagnosis not present

## 2022-05-25 DIAGNOSIS — C50412 Malignant neoplasm of upper-outer quadrant of left female breast: Secondary | ICD-10-CM | POA: Diagnosis not present

## 2022-05-25 DIAGNOSIS — Z95828 Presence of other vascular implants and grafts: Secondary | ICD-10-CM

## 2022-05-25 DIAGNOSIS — R112 Nausea with vomiting, unspecified: Secondary | ICD-10-CM | POA: Diagnosis not present

## 2022-05-25 DIAGNOSIS — C50411 Malignant neoplasm of upper-outer quadrant of right female breast: Secondary | ICD-10-CM | POA: Diagnosis not present

## 2022-05-25 MED ORDER — SODIUM CHLORIDE 0.9 % IV SOLN
25.0000 mg | Freq: Once | INTRAVENOUS | Status: AC
  Administered 2022-05-25: 25 mg via INTRAVENOUS
  Filled 2022-05-25: qty 1

## 2022-05-25 MED ORDER — PROMETHAZINE HCL 25 MG RE SUPP
25.0000 mg | Freq: Four times a day (QID) | RECTAL | 0 refills | Status: DC | PRN
  Filled 2022-05-25: qty 12, 3d supply, fill #0

## 2022-05-25 MED ORDER — SODIUM CHLORIDE 0.9 % IV SOLN: Freq: Once | INTRAVENOUS | Status: DC

## 2022-05-25 MED ORDER — HYDROMORPHONE HCL 1 MG/ML IJ SOLN
1.0000 mg | Freq: Once | INTRAMUSCULAR | Status: AC
  Administered 2022-05-25: 1 mg via INTRAVENOUS
  Filled 2022-05-25: qty 1

## 2022-05-25 MED ORDER — FAMOTIDINE IN NACL 20-0.9 MG/50ML-% IV SOLN
20.0000 mg | Freq: Once | INTRAVENOUS | Status: AC
  Administered 2022-05-25: 20 mg via INTRAVENOUS
  Filled 2022-05-25: qty 50

## 2022-05-25 MED ORDER — SODIUM CHLORIDE 0.9 % IV SOLN: Freq: Once | INTRAVENOUS | Status: AC

## 2022-05-25 MED ORDER — ONDANSETRON HCL 4 MG/2ML IJ SOLN
8.0000 mg | Freq: Once | INTRAMUSCULAR | Status: AC
  Administered 2022-05-25: 8 mg via INTRAVENOUS
  Filled 2022-05-25: qty 4

## 2022-05-25 NOTE — Patient Instructions (Signed)

## 2022-05-25 NOTE — Progress Notes (Signed)
Received message infusion RN stating pt complaint of severe pain and is unable to keep down PO pain medications at home. Verbal orders received from MD for pt to receive 1 mg IV Dilaudid today during infusion.  Orders placed under sign and held.

## 2022-05-25 NOTE — Progress Notes (Signed)
While obtaining pt post IVF vitals, pt sat up and began to vomit. Pt states she does not have any nausea medications at home as she hasn't been able to go to the pharmacy. Anda Kraft, [place] notified. Phenergan ordered, pt tolerated well. Discharged with VSS, ambulatory to lobby, pt states she is feeling a little better

## 2022-05-25 NOTE — Progress Notes (Signed)
[facility] CSW Progress Note  Holiday representative met with patient per pt request. Provided bag of food from [employer] as well as list of local food pantries. Updated on [facility] referral. Pt asked about gas cards but has used all of [employer]. CSW encouraged pt to reach out to her Medicaid worker re: gas reimbursement. Pt also did not receive email from [employer] last week. Resent today with link for housing search as well as [employer]. Pt stated that she is looking into FMLA through work although she has only been with the company since June.    Chelsea Fackler E Kendon [place], LCSW

## 2022-05-25 NOTE — Progress Notes (Unsigned)
Symptom Management Consult note [place]    Patient Care Team: [name], NP as PCP - General (Internal Medicine) [name], DO as PCP - Cardiology (Cardiology) [name], MD as Consulting Physician (Radiation Oncology) [name], MD as Consulting Physician (General Surgery) [name], MD as Consulting Physician (Hematology and Oncology)    Name of the patient: Chelsea Hansen  [id number]  10-Jan-1971   Date of visit: 05/25/2022   Chief Complaint/Reason for visit: nausea and vomiting   Current Therapy: Stopped Verzenio yesterday     ASSESSMENT & PLAN: Patient is a 51 y.o. 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 Lindi Adie.  I have viewed most recent oncology note and lab work.    #) Metastatic breast cancer to bone - Had office visit with [name] NP yesterday and advised to hold Verzenio 2/2 severe nausea and vomiting. Patient receiving IVF x 3 days with today being day 2.  - Next appointment with oncologist is 05/31/22.   #) Nausea and vomiting -Patient nontoxic appearing. Benign abdominal exam. Symptoms likely AE of Verzenio ( Nausea 39-64% and vomiting 26-35%) -Prior to my exam patient received Pepcid, zofran, and dilaudid. As IVF were finishing she reported continued nausea and had 1 episode of NBNB emesis. She was given IV phenergan and symptoms resolved. She was able to tolerate PO intake with soda. -I viewed labs from 05/24/22 which showed AKI with creatinine 1.16 with baseline around 0.8. No leukocytosis or transaminitis.  -Patient has not yet picked anti-emetic from pharmacy prescribed yesterday and is planning to when she leaves today. She is anxious about having uncontrolled symptoms at home and is requesting phenergan suppository as that has worked best for her in the past. I sent a 3 day supply to pharmacy. -Discussed with patient the importance of pushing fluid intake at home  to avoid worsening dehydration. -Patient will return tomorrow for  day 3 of IVF. I discussed ED precautions should symptoms worsen.       Heme/Onc History: Oncology History  Malignant neoplasm of upper-outer quadrant of right breast in female, estrogen receptor positive ([place])  12/12/2020 Initial Diagnosis   status post bilateral breast biopsies 12/03/2020, showing             (1) on the right, a clinical T2 N1, stage IIa invasive lobular carcinoma, grade 2, estrogen and progesterone receptor strongly positive, HER2 not amplified, with an MIB-1 of 40%                         (a) the biopsied right axillary lymph node was positive with extracapsular extension                         (b) a second right breast mass also biopsied was a fibroadenoma, concordant  MAMMAPRINT tested on biopsy returned high risk, luminal type B, indicating significant benefit from chemo                         (c) biopsy of an area of non-mass-like enhancement in the upper right breast pending   12/17/2020 Cancer Staging   Staging form: Breast, AJCC 8th Edition - Clinical stage from 12/17/2020: Stage IIA (cT2, cN1, cM0, G2, ER+, PR+, HER2-) - Signed by [name], NP on 01/14/2022 Stage prefix: Initial diagnosis Histologic grading system: 3 grade system Laterality:  Right Staged by: Pathologist and managing physician Stage used in treatment planning: Yes National guidelines used in treatment planning: Yes Type of national guideline used in treatment planning: NCCN   12/24/2020 Genetic Testing   Negative genetic testing:  No pathogenic variants detected on the [employer] BRCAplus panel (report date 12/24/2020) or the CancerNext-Expanded + RNAinsight panel (report date 12/31/2020). 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   The BRCAplus panel offered by [employer] and includes sequencing and deletion/duplication analysis for the following 8 genes: ATM,  BRCA1, BRCA2, CDH1, CHEK2, PALB2, PTEN, and TP53. The CancerNext-Expanded + RNAinsight gene panel offered by [employer] and includes sequencing and rearrangement analysis for the following 77 genes: AIP, ALK, APC, ATM, AXIN2, BAP1, BARD1, BLM, BMPR1A, BRCA1, BRCA2, BRIP1, CDC73, CDH1, CDK4, CDKN1B, CDKN2A, CHEK2, CTNNA1, DICER1, FANCC, FH, FLCN, GALNT12, KIF1B, LZTR1, MAX, MEN1, MET, MLH1, MSH2, MSH3, MSH6, MUTYH, NBN, NF1, NF2, NTHL1, PALB2, PHOX2B, PMS2, POT1, PRKAR1A, PTCH1, PTEN, RAD51C, RAD51D, RB1, RECQL, RET, SDHA, SDHAF2, SDHB, SDHC, SDHD, SMAD4, SMARCA4, SMARCB1, SMARCE1, STK11, SUFU, TMEM127, TP53, TSC1, TSC2, VHL and XRCC2 (sequencing and deletion/duplication); EGFR, EGLN1, HOXB13, KIT, MITF, PDGFRA, POLD1 and POLE (sequencing only); EPCAM and GREM1 (deletion/duplication only). RNA data is routinely analyzed for use in variant interpretation for all genes.   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 Multifocal sclerotic bony metastatic disease  greatest in the thoracic spine with scattered rib lesions and sclerosis as well. 2. No nodal or visceral metastasis about the chest. 3. Collateral pathways about the chest suggest some subclavian venous narrowing on the LEFT. This could also be related to arm position. Correlate clinically. 4. Signs of RIGHT breast lumpectomy. Potential postoperative changes in the LEFT breast with small focal area of nodularity in the lateral LEFT breast which could also be postoperative, correlate clinically and consider mammographic correlation as warranted. 5. Mild cardiac enlargement.   05/18/2022 Treatment Plan Change   Anastrozole + Verzenio 129m PO BID   Malignant neoplasm of upper-outer quadrant of left breast in female, estrogen receptor positive ([place]  12/12/2020 Initial Diagnosis   on the left, a clinical T1b N0, stage IA invasive ductal carcinoma, grade 1 or 2, estrogen and progesterone receptor positive, HER2 not amplified, with an MIB 1 of 15%   12/17/2020 Cancer Staging   Staging form: Breast, AJCC 8th Edition - Clinical stage from 12/17/2020: Stage IA (cT1b, cN0, cM0, G2, ER+, PR+, HER2-) - Signed by CGardenia Phlegm NP on 01/14/2022 Stage prefix: Initial diagnosis Histologic grading system: 3 grade system  Laterality: Left Staged by: Pathologist and managing physician Stage used in treatment planning: Yes National guidelines used in treatment planning: Yes Type of national guideline used in treatment planning: NCCN   12/24/2020 Genetic Testing   Negative genetic testing:  No pathogenic variants detected on the [employer] BRCAplus panel (report date 12/24/2020) or the CancerNext-Expanded + RNAinsight panel (report date 12/31/2020). 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   The BRCAplus panel offered by [employer] and includes sequencing and deletion/duplication analysis for the following 8 genes: ATM, BRCA1, BRCA2, CDH1, CHEK2,  PALB2, PTEN, and TP53. The CancerNext-Expanded + RNAinsight gene panel offered by [employer] and includes sequencing and rearrangement analysis for the following 77 genes: AIP, ALK, APC, ATM, AXIN2, BAP1, BARD1, BLM, BMPR1A, BRCA1, BRCA2, BRIP1, CDC73, CDH1, CDK4, CDKN1B, CDKN2A, CHEK2, CTNNA1, DICER1, FANCC, FH, FLCN, GALNT12, KIF1B, LZTR1, MAX, MEN1, MET, MLH1, MSH2, MSH3, MSH6, MUTYH, NBN, NF1, NF2, NTHL1, PALB2, PHOX2B, PMS2, POT1, PRKAR1A, PTCH1, PTEN, RAD51C, RAD51D, RB1, RECQL, RET, SDHA, SDHAF2, SDHB, SDHC, SDHD, SMAD4, SMARCA4, SMARCB1, SMARCE1, STK11, SUFU, TMEM127, TP53, TSC1, TSC2, VHL and XRCC2 (sequencing and deletion/duplication); EGFR, EGLN1, HOXB13, KIT, MITF, PDGFRA, POLD1 and POLE (sequencing only); EPCAM and GREM1 (deletion/duplication only). RNA data is routinely analyzed for use in variant interpretation for all genes.   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 Multifocal sclerotic bony metastatic disease greatest in the thoracic spine with scattered rib lesions and  sclerosis as well. 2. No nodal or visceral metastasis about the chest. 3. Collateral pathways about the chest suggest some subclavian venous narrowing on the LEFT. This could also be related to arm position. Correlate clinically. 4. Signs of RIGHT breast lumpectomy. Potential postoperative changes in the LEFT breast with small focal area of nodularity in the lateral LEFT breast which could also be postoperative, correlate clinically and consider mammographic correlation as warranted. 5. Mild cardiac enlargement.   05/18/2022 Treatment Plan Change   Anastrozole + Verzenio 133m PO BID       Interval history-: RQIANA LANDGREBEis a 51y.o. female with oncologic history as above seen in the infusion center today for chief complaint of nausea and vomiting.  Patient seen by oncology NP yesterday and thought to have vomiting from her Verzenio.  Her last dose of it was yesterday and she is receiving 3 days of IV fluids with plan to have the labs rechecked 05/31/2022 as she had mild AKI. Patient tells me that despite the antiemetics she received today she is still having nausea and vomiting.  She had 1 episode right before  being examined.  Patient tells me she has prescriptions waiting for her at the pharmacy although she has not yet picked them up.  She is asking for prescription for Phenergan suppositories as that has worked best for her in the past.  Patient is planning to go to the pharmacy when she leaves here today.  She denies any associated abdominal pain, fever, chills, dizziness, urinary symptoms or diarrhea.     ROS  All other systems are reviewed and are negative for acute change except as noted in the HPI.    No Known Allergies   Past Medical History:  Diagnosis Date   Allergy    seasonal allergies   Anxiety    on meds   Asthma    uses inhaler   Breast cancer ([place]) 2012   RIGHT lumpectomy   Cancer ([place]) 2022   RIGHT breast lump-dx 2022   Depression    on meds   DVT  (deep venous thrombosis) ([place]) 2010   after hysterectomy   Family history of uterine cancer    GERD (gastroesophageal reflux disease)    with certain foods/OTC PRN meds   Headache(784.0)    Hyperlipidemia    on meds   Hypertension    on meds   SVT (supraventricular tachycardia)      Past Surgical History:  Procedure Laterality Date   ABDOMINAL HYSTERECTOMY     AXILLARY SENTINEL NODE BIOPSY Left 07/09/2021   Procedure: LEFT AXILLARY SENTINEL NODE BIOPSY;  Surgeon: [name], MD;  Location: [facility];  Service: General;  Laterality: Left;   BREAST EXCISIONAL BIOPSY Right 09/16/2009   BREAST LUMPECTOMY WITH RADIOACTIVE SEED LOCALIZATION Bilateral 07/09/2021   Procedure: BILATERAL BREAST LUMPECTOMY WITH RADIOACTIVE SEED LOCALIZATION;  Surgeon: [name], MD;  Location: [place];  Service: General;  Laterality: Bilateral;   BREAST SURGERY     lumpectomy   PORT-A-CATH REMOVAL Left 07/09/2021   Procedure: REMOVAL PORT-A-CATH;  Surgeon: [name], MD;  Location: [facility];  Service: General;  Laterality: Left;   PORTACATH PLACEMENT Left 01/26/2021   Procedure: INSERTION PORT-A-CATH;  Surgeon: [name], MD;  Location: [facility];  Service: General;  Laterality: Left;   RADIOACTIVE SEED GUIDED AXILLARY SENTINEL LYMPH NODE Right 07/09/2021   Procedure: RADIOACTIVE SEED GUIDED RIGHT AXILLARY SENTINEL LYMPH NODE DISSECTION;  Surgeon: [name], MD;  Location: [facility];  Service: General;  Laterality: Right;   TUBAL LIGATION      Social History   Socioeconomic History   Marital status: Single    Spouse name: Not on file   Number of children: Not on file   Years of education: Not on file   Highest education level: Not on file  Occupational History   Not on file  Tobacco Use   Smoking status: Never   Smokeless tobacco: Never  Vaping Use   Vaping Use: Never used  Substance and Sexual  Activity   Alcohol use: Not Currently    Alcohol/week: 7.0 standard drinks of alcohol    Types: 7 Standard drinks or equivalent per week    Comment: occassional   Drug use: No   Sexual activity: Not Currently  Other Topics Concern   Not on file  Social History Narrative   Not on file   Social Determinants of Health   Financial Resource Strain: High Risk (01/14/2022)   Overall Financial Resource Strain (CARDIA)    Difficulty of Paying Living Expenses: Very hard  Food Insecurity:  Food Insecurity Present (01/14/2022)   Hunger Vital Sign    Worried About Running Out of Food in the Last Year: Sometimes true    Ran Out of Food in the Last Year: Sometimes true  Transportation Needs: No Transportation Needs (12/02/2020)   PRAPARE - Hydrologist (Medical): No    Lack of Transportation (Non-Medical): No  Physical Activity: Not on file  Stress: Not on file  Social Connections: Not on file  Intimate Partner Violence: Not on file    Family History  Problem Relation Age of Onset   Breast cancer Mother        30s   Hypertension Father    Uterine cancer Maternal Aunt    Ovarian cancer Maternal Aunt    Breast cancer Maternal Aunt        20s   Breast cancer Cousin 22   Uterine cancer Cousin 28   Colon polyps Neg Hx    Colon cancer Neg Hx    Esophageal cancer Neg Hx    Stomach cancer Neg Hx      Current Outpatient Medications:    abemaciclib (VERZENIO) 50 MG tablet, Take 1 tablet (50 mg total) by mouth 2 (two) times daily., Disp: 60 tablet, Rfl: 3   albuterol (VENTOLIN HFA) 108 (90 Base) MCG/ACT inhaler, Inhale 2 puffs by mouth into the lungs every 6 hours as needed for wheezing or shortness of breath., Disp: 18 g, Rfl: 2   amLODipine (NORVASC) 10 MG tablet, Take 1 tablet  by mouth daily., Disp: 90 tablet, Rfl: 1   anastrozole (ARIMIDEX) 1 MG tablet, Take 1 tablet (1 mg total) by mouth daily., Disp: 90 tablet, Rfl: 3   ARIPiprazole (ABILIFY) 15 MG tablet,  Take 1 tablet by mouth at bedtime, Disp: 30 tablet, Rfl: 0   escitalopram (LEXAPRO) 20 MG tablet, Take 1 tablet (20 mg total) by mouth daily., Disp: 90 tablet, Rfl: 1   hydrochlorothiazide (HYDRODIURIL) 25 MG tablet, Take 1 tablet by mouth daily., Disp: 30 tablet, Rfl: 0   montelukast (SINGULAIR) 10 MG tablet, Take 1 tablet (10 mg total) by mouth at bedtime., Disp: 90 tablet, Rfl: 3   ondansetron (ZOFRAN-ODT) 8 MG disintegrating tablet, Dissolve 1 tablet (8 mg total) by mouth every 8 (eight) hours as needed for nausea or vomiting., Disp: 20 tablet, Rfl: 0   [START ON 07/20/2022] oxyCODONE-acetaminophen (PERCOCET) 5-325 MG tablet, Take 1 tablet by mouth every 6 (six) hours as needed for severe pain. (07/20/22) (Patient not taking: Reported on 05/20/2022), Disp: 120 tablet, Rfl: 0   oxyCODONE-acetaminophen (PERCOCET) 7.5-325 MG tablet, Take 1 tablet by mouth every 8 (eight) hours as needed for severe pain., Disp: 102 tablet, Rfl: 0   pantoprazole (PROTONIX) 40 MG tablet, Take 1 tablet (40 mg total) by mouth daily., Disp: 30 tablet, Rfl: 0   prochlorperazine (COMPAZINE) 10 MG tablet, Take 1 tablet (10 mg total) by mouth every 6 (six) hours as needed for nausea or vomiting., Disp: 30 tablet, Rfl: 0   promethazine (PHENERGAN) 25 MG suppository, Place 1 suppository (25 mg total) rectally every 6 (six) hours as needed for up to 3 days for nausea or vomiting., Disp: 12 suppository, Rfl: 0   propranolol (INDERAL) 10 MG tablet, Take 1 tablet (10 mg total) by mouth 2 (two) times daily., Disp: 180 tablet, Rfl: 3   valsartan (DIOVAN) 40 MG tablet, Take 1 tablet (40 mg total) by mouth daily., Disp: 90 tablet, Rfl: 3  PHYSICAL EXAM: ECOG  FS:1 - Symptomatic but completely ambulatory  T: 97.8   BP: 144/79  HR: 52   Resp: 18   O2: 100% Physical Exam Vitals and nursing note reviewed.  Constitutional:      Appearance: She is well-developed. She is not ill-appearing or toxic-appearing.  HENT:     Head: Normocephalic.      Nose: Nose normal.     Mouth/Throat:     Mouth: Mucous membranes are dry.  Eyes:     Conjunctiva/sclera: Conjunctivae normal.  Neck:     Vascular: No JVD.  Cardiovascular:     Rate and Rhythm: Regular rhythm. Bradycardia present.     Pulses: Normal pulses.     Heart sounds: Normal heart sounds.  Pulmonary:     Effort: Pulmonary effort is normal.     Breath sounds: Normal breath sounds.  Abdominal:     General: There is no distension.     Palpations: There is no mass.     Tenderness: There is no abdominal tenderness. There is no right CVA tenderness, left CVA tenderness, guarding or rebound.  Musculoskeletal:     Cervical back: Normal range of motion.  Skin:    General: Skin is warm and dry.  Neurological:     Mental Status: She is oriented to person, place, and time.        LABORATORY DATA: I have reviewed the data as listed    Latest Ref Rng & Units 05/24/2022   12:06 PM 05/18/2022    1:20 PM 02/19/2022    1:24 PM  CBC  WBC 4.0 - 10.5 K/uL 6.1  5.9  5.0   Hemoglobin 12.0 - 15.0 g/dL 11.9  11.0  10.7   Hematocrit 36.0 - 46.0 % 37.7  34.3  33.4   Platelets 150 - 400 K/uL 310  252  277         Latest Ref Rng & Units 05/24/2022   12:06 PM 05/18/2022    1:20 PM 05/11/2022    4:01 PM  CMP  Glucose 70 - 99 mg/dL 100  86    BUN 6 - 20 mg/dL 16  14    Creatinine 0.44 - 1.00 mg/dL 1.16  0.84  0.90   Sodium 135 - 145 mmol/L 140  139    Potassium 3.5 - 5.1 mmol/L 4.1  3.6    Chloride 98 - 111 mmol/L 102  104    CO2 22 - 32 mmol/L 32  30    Calcium 8.9 - 10.3 mg/dL 10.1  9.6    Total Protein 6.5 - 8.1 g/dL 7.8  7.7    Total Bilirubin 0.3 - 1.2 mg/dL 0.4  0.4    Alkaline Phos 38 - 126 U/L 95  100    AST 15 - 41 U/L 16  17    ALT 0 - 44 U/L 16  20         RADIOGRAPHIC STUDIES (from last 24 hours if applicable) I have personally reviewed the radiological images as listed and agreed with the findings in the report. No results found.      Visit  Diagnosis: 1. Malignant neoplasm of upper-outer quadrant of left breast in female, estrogen receptor positive ([place])   2. Nausea and vomiting, unspecified vomiting type      No orders of the defined types were placed in this encounter.   All questions were answered. The patient knows to call the clinic with any problems, questions or concerns. No barriers to learning  was detected.  I have spent a total of 30 minutes minutes of face-to-face and non-face-to-face time, preparing to see the patient, obtaining and/or reviewing separately obtained history, performing a medically appropriate examination, counseling and educating the patient, prescribing medication, documenting clinical information in the electronic health record.    Thank you for allowing me to participate in the care of this patient.    Barrie Folk, PA-C Department of Hematology/Oncology [facility] Phone: [phone]  [phone]    05/26/2022 11:21 AM

## 2022-05-26 ENCOUNTER — Inpatient Hospital Stay: Payer: Medicaid Other

## 2022-05-26 ENCOUNTER — Encounter: Payer: Self-pay | Admitting: Hematology and Oncology

## 2022-05-26 ENCOUNTER — Other Ambulatory Visit: Payer: Self-pay

## 2022-05-26 VITALS — BP 146/8 | HR 67 | Temp 98.3°F | Resp 18

## 2022-05-26 DIAGNOSIS — C50411 Malignant neoplasm of upper-outer quadrant of right female breast: Secondary | ICD-10-CM | POA: Diagnosis not present

## 2022-05-26 DIAGNOSIS — Z95828 Presence of other vascular implants and grafts: Secondary | ICD-10-CM

## 2022-05-26 LAB — CMP (CANCER CENTER ONLY)
ALT: 19 U/L (ref 0–44)
AST: 16 U/L (ref 15–41)
Albumin: 4 g/dL (ref 3.5–5.0)
Alkaline Phosphatase: 89 U/L (ref 38–126)
Anion gap: 8 (ref 5–15)
BUN: 16 mg/dL (ref 6–20)
CO2: 28 mmol/L (ref 22–32)
Calcium: 9.2 mg/dL (ref 8.9–10.3)
Chloride: 106 mmol/L (ref 98–111)
Creatinine: 1.08 mg/dL — ABNORMAL HIGH (ref 0.44–1.00)
GFR, Estimated: >60 mL/min (ref >60)
Glucose, Bld: 65 mg/dL — ABNORMAL LOW (ref 70–99)
Potassium: 4.1 mmol/L (ref 3.5–5.1)
Sodium: 142 mmol/L (ref 135–145)
Total Bilirubin: 0.4 mg/dL (ref 0.3–1.2)
Total Protein: 7.5 g/dL (ref 6.5–8.1)

## 2022-05-26 LAB — CBC WITH DIFFERENTIAL (CANCER CENTER ONLY)
Abs Immature Granulocytes: 0.01 10*3/uL (ref 0.00–0.07)
Basophils Absolute: 0 10*3/uL (ref 0.0–0.1)
Basophils Relative: 1 %
Eosinophils Absolute: 0.1 10*3/uL (ref 0.0–0.5)
Eosinophils Relative: 1 %
HCT: 36.3 % (ref 36.0–46.0)
Hemoglobin: 11.5 g/dL — ABNORMAL LOW (ref 12.0–15.0)
Immature Granulocytes: 0 %
Lymphocytes Relative: 25 %
Lymphs Abs: 1.2 10*3/uL (ref 0.7–4.0)
MCH: 24 pg — ABNORMAL LOW (ref 26.0–34.0)
MCHC: 31.7 g/dL (ref 30.0–36.0)
MCV: 75.8 fL — ABNORMAL LOW (ref 80.0–100.0)
Monocytes Absolute: 0.3 10*3/uL (ref 0.1–1.0)
Monocytes Relative: 6 %
Neutro Abs: 3.3 10*3/uL (ref 1.7–7.7)
Neutrophils Relative %: 67 %
Platelet Count: 307 10*3/uL (ref 150–400)
RBC: 4.79 MIL/uL (ref 3.87–5.11)
RDW: 15.9 % — ABNORMAL HIGH (ref 11.5–15.5)
WBC Count: 5 10*3/uL (ref 4.0–10.5)
nRBC: 0 % (ref 0.0–0.2)

## 2022-05-26 MED ORDER — SODIUM CHLORIDE 0.9 % IV SOLN: Freq: Once | INTRAVENOUS | Status: AC

## 2022-05-26 MED ORDER — FAMOTIDINE IN NACL 20-0.9 MG/50ML-% IV SOLN
20.0000 mg | Freq: Once | INTRAVENOUS | Status: AC
  Administered 2022-05-26: 20 mg via INTRAVENOUS
  Filled 2022-05-26: qty 50

## 2022-05-26 MED ORDER — SODIUM CHLORIDE 0.9 % IV SOLN
Freq: Once | INTRAVENOUS | Status: AC
  Filled 2022-05-26: qty 4

## 2022-05-26 NOTE — Patient Instructions (Signed)

## 2022-05-27 ENCOUNTER — Ambulatory Visit: Payer: Medicaid Other | Attending: Adult Health

## 2022-05-27 DIAGNOSIS — C50412 Malignant neoplasm of upper-outer quadrant of left female breast: Secondary | ICD-10-CM | POA: Insufficient documentation

## 2022-05-27 DIAGNOSIS — C50411 Malignant neoplasm of upper-outer quadrant of right female breast: Secondary | ICD-10-CM | POA: Diagnosis present

## 2022-05-27 DIAGNOSIS — Z170 Estrogen receptor positive status [ER+]: Secondary | ICD-10-CM | POA: Insufficient documentation

## 2022-05-27 DIAGNOSIS — R293 Abnormal posture: Secondary | ICD-10-CM | POA: Diagnosis present

## 2022-05-27 DIAGNOSIS — I890 Lymphedema, not elsewhere classified: Secondary | ICD-10-CM | POA: Diagnosis present

## 2022-05-27 DIAGNOSIS — R209 Unspecified disturbances of skin sensation: Secondary | ICD-10-CM | POA: Diagnosis present

## 2022-05-27 DIAGNOSIS — L599 Disorder of the skin and subcutaneous tissue related to radiation, unspecified: Secondary | ICD-10-CM | POA: Diagnosis present

## 2022-05-27 NOTE — Therapy (Signed)
OUTPATIENT PHYSICAL THERAPY ONCOLOGY TREATMENT  Patient Name: Chelsea Hansen MRN: [id number] DOB:1971-03-03, 51 y.o., female Today's Date: 05/27/2022   PT End of Session - 05/27/22 0757     Visit Number 13    Number of Visits 20    Date for PT Re-Evaluation 06/20/22    Authorization Type Medicaid    Authorization Time Period 3    Authorization - Number of Visits 3    Progress Note Due on Visit 20    PT Start Time 0800    PT Stop Time 7106    PT Time Calculation (min) 50 min    Activity Tolerance Patient tolerated treatment well    Behavior During Therapy [facility] for tasks assessed/performed              Past Medical History:  Diagnosis Date   Allergy    seasonal allergies   Anxiety    on meds   Asthma    uses inhaler   Breast cancer ([place]) 2012   RIGHT lumpectomy   Cancer ([place]) 2022   RIGHT breast lump-dx 2022   Depression    on meds   DVT (deep venous thrombosis) ([place]) 2010   after hysterectomy   Family history of uterine cancer    GERD (gastroesophageal reflux disease)    with certain foods/OTC PRN meds   Headache(784.0)    Hyperlipidemia    on meds   Hypertension    on meds   SVT (supraventricular tachycardia)    Past Surgical History:  Procedure Laterality Date   ABDOMINAL HYSTERECTOMY     AXILLARY SENTINEL NODE BIOPSY Left 07/09/2021   Procedure: LEFT AXILLARY SENTINEL NODE BIOPSY;  Surgeon: [name], MD;  Location: [place];  Service: General;  Laterality: Left;   BREAST EXCISIONAL BIOPSY Right 09/16/2009   BREAST LUMPECTOMY WITH RADIOACTIVE SEED LOCALIZATION Bilateral 07/09/2021   Procedure: BILATERAL BREAST LUMPECTOMY WITH RADIOACTIVE SEED LOCALIZATION;  Surgeon: [name], MD;  Location: [place];  Service: General;  Laterality: Bilateral;   BREAST SURGERY     lumpectomy   PORT-A-CATH REMOVAL Left 07/09/2021   Procedure: REMOVAL PORT-A-CATH;  Surgeon: [name], MD;  Location: [place];  Service: General;  Laterality: Left;   PORTACATH PLACEMENT Left 01/26/2021   Procedure: INSERTION PORT-A-CATH;  Surgeon: [name], MD;  Location: [facility];  Service: General;  Laterality: Left;   RADIOACTIVE SEED GUIDED AXILLARY SENTINEL LYMPH NODE Right 07/09/2021   Procedure: RADIOACTIVE SEED GUIDED RIGHT AXILLARY SENTINEL LYMPH NODE DISSECTION;  Surgeon: [name], MD;  Location: [place];  Service: General;  Laterality: Right;   TUBAL LIGATION     Patient Active Problem List   Diagnosis Date Noted   Metastasis to bone ([place]) 05/20/2022   PSVT (paroxysmal supraventricular tachycardia) 07/08/2021   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   GERD (gastroesophageal reflux disease) 05/27/2021   Hyperlipidemia 05/27/2021   Hypertension 05/27/2021   Pain management contract signed 05/04/2021   Chronic pain syndrome (breast cancer) 05/04/2021   Palpitations 03/25/2021   Sinus bradycardia 03/25/2021   Daytime somnolence 03/25/2021   Snoring 03/25/2021   Obesity (BMI 30-39.9) 03/25/2021   Port-A-Cath in place 01/27/2021   Genetic testing 12/24/2020   Family history of uterine cancer 12/17/2020   Malignant neoplasm of upper-outer quadrant of right breast in female, estrogen receptor positive ([place]) 12/12/2020   Malignant neoplasm of upper-outer quadrant of left breast in female, estrogen receptor positive ([place]) 12/12/2020  Hypokalemia 01/02/2012   Asthma 01/01/2012   Bradycardia 01/01/2012   Syncope 12/31/2011   Mediastinal adenopathy 12/31/2011   Anemia 12/31/2011   Chest pain 12/31/2011    PCP: Juluis Mire,  REFERRING PROVIDER: Wilber Bihari, NP  REFERRING DIAG: Bilateral Breast swelling, decreased shoulder ROM bilaterally, Bilateral LE neuropathy  THERAPY DIAG:  Malignant neoplasm of upper-outer quadrant of right breast in female, estrogen receptor positive ([place])  Malignant neoplasm of upper-outer quadrant of left  breast in female, estrogen receptor positive ([place])  Abnormal posture  Disturbance of skin sensation  Disorder of the skin and subcutaneous tissue related to radiation, unspecified  Lymphedema, not elsewhere classified  ONSET DATE: 01/2021  Rationale for Evaluation and Treatment Rehabilitation  SUBJECTIVE                                                                                                                                                                                           SUBJECTIVE STATEMENT: The new medicine took me all the way down. I was taken off the medicine and will start a new dose on Monday. I had 3 days of IV fluids for dehydration. I have to go to work today [employer]. I am still really tired today. I have my PET scan next Wednesday  HISTORY Neoadjuvant chemotherapy with Doxorubicin and Cyclophosphamide given x 4 beginning 01/27/2021 and completing on 03/10/2021 followed by weekly paclitaxel x 12 beginning 05/2021 07/09/2021: Right lumpectomy: Grade 2 invasive lobular cancer, 2.5 cm, LCIS, margins negative, LCIS focally at anterior margin, 1/1 lymph node positive, ER 95%, PR 100%, HER2 negative, Ki-67 40% Left lumpectomy: High-grade DCIS: 0.7 cm, margins negative, 0/1 lymph node negative ER 95%, PR 100%  PAIN:  Are you having pain? Yes NPRS scale: 9/10  Pain location:   all over Pain orientation: Right  PAIN TYPE: aching, burning, sharp, and tight Pain description: constant  Aggravating factors: household chores,lifting,sleeping,  Relieving factors: pain meds  PRECAUTIONS: Bilateral Lymphedema risk, neuropathy  WEIGHT BEARING RESTRICTIONS No  FALLS:  Has patient fallen in last 6 months? 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[employer]'  LEISURE: shop, dominoes  HAND DOMINANCE : right   PRIOR LEVEL OF FUNCTION: Independent  PATIENT GOALS stop the pain,be able to reach without pain, be able to clean   OBJECTIVE  COGNITION:  Overall cognitive status: Within functional limits for tasks assessed   PALPATION: Very tender  throughout bilateral UQ  OBSERVATIONS / OTHER ASSESSMENTS: Both breast with generalized swelling and enlarged pores  SENSATION:  Light touch: Deficits at lateral breast, medial arm inact     POSTURE: Forward head, rounded shoulders  UPPER EXTREMITY AROM/PROM:  A/PROM RIGHT   eval  Right 03/25/2022 Right 03/31/2022 Right 05/05/2022 Right 05/18/2022  Shoulder extension 42  48    Shoulder flexion 110 pulls under arm 148 150 159 164  Shoulder abduction 103 128 155 178 178  Shoulder internal rotation 55  60  68  Shoulder external rotation 79  93  99    (Blank rows = not tested)  A/PROM LEFT   eval Left 03/25/2022 Left 03/31/2022 Left  05/05/2022 LEFT 05/18/2022  Shoulder extension 52  52    Shoulder flexion 120 147 159 159 162  Shoulder abduction 115 154 168 178 178  Shoulder internal rotation 55  60  73  Shoulder external rotation 80  91  100    (Blank rows = not tested)   CERVICAL AROM: All within functional limits:     UPPER EXTREMITY STRENGTH: NT due to pain  LOWER EXTREMITY MMT: 4+/5  LYMPHEDEMA ASSESSMENTS:   SURGERY TYPE/DATE: 07/09/2021 BILATERAL BREAST LUMPECTOMY WITH RADIOACTIVE SEED LOCALIZATION RADIOACTIVE SEED GUIDED RIGHT AXILLARY TARGETED LYMPH NODE DISSECTION LEFT AXILLARY SENTINEL NODE BIOPSY LEFT AXILLARY LYMPH NODE MAPPING WITH SENTIMAG  NUMBER OF LYMPH NODES REMOVED: Right 1/1, Left 0/1  CHEMOTHERAPY: neoadjuvant  RADIATION:yes bilaterally  HORMONE TREATMENT: yes  INFECTIONS: no  LYMPHEDEMA ASSESSMENTS:   LANDMARK RIGHT  eval  10 cm proximal to olecranon process 30.5  Olecranon process 27.1  10 cm  proximal to ulnar styloid process 21.2  Just proximal to ulnar styloid process 16.6  Across hand at thumb web space 21.4  At base of 2nd digit 6.8  (Blank rows = not tested)  LANDMARK LEFT  eval  10 cm proximal to olecranon process 30.7  Olecranon process 26.7  10 cm proximal to ulnar styloid process 20.5  Just proximal to ulnar styloid process 16.3  Across hand at thumb web space 21.1  At base of 2nd digit 6.7  (Blank rows = not tested)   FUNCTIONAL TESTS:   SLS ; 2-3 seconds ea leg GAIT: Distance walked: 20 Assistive device utilized: None Level of assistance: Complete Independence     QUICK DASH SURVEY: 77%,  03/31/2022: 31.82 BREAST COMPLAINTS SURVEY  :75  TODAY'S TREATMENT   05/27/2022 STM to bilateral UT, posterior cervicals. Pectorals, and lats in supine with cocoa butter [id number] performed short neck, 5 diaphragmatic breaths, activated right axillary and inguinal LN's, right axillo-inguinal pathway and right breast lateral to pathway, then medial right breast to lateral and pathway in supine  ending with LN's. Therapist then performed same but to left breast.   05/20/2022 Practiced balance activities: tandem stance bilaterally x 2 ea, and SLS Bilaterally x 4 ea. After practice pt able to maintain SLS for a head count of 30. [id number] performed short neck, 5 diaphragmatic breaths, activated right axillary and inguinal LN's, right axillo-inguinal pathway and right breast lateral to pathway, then medial right breast to lateral and pathway in supine  ending with LN's. Therapist demonstrated for pt and then pt performed each position multiple times.    05/18/2022 Pulleys flexion x 2 min, scaption x 1 min, abduction x 3 min [id number] performed short neck, 5 diaphragmatic breaths, activated right axillary and inguinal LN's, right axillo-inguinal pathway and right breast lateral to pathway, then medial right breast to  lateral and pathway in supine and Left SL  ending with LN's. Emphasis on axilla and medial breast   05/10/2022 Pulleys x 2 min flexion and abduction, 1 min scaption, ball rolls forward on wall x 10  Scapular Retraction with Resistance  10 reps-  - Scapular Retraction with Resistance Advanced  10 reps-  - Shoulder External Rotation and Scapular Retraction with Resistance10 reps [id number] performed short neck, 5 diaphragmatic breaths, activated right axillary and inguinal LN's, right axillo-inguinal pathway and right breast lateral to pathway, then medial right breast to lateral and pathway in supine and Left SL ending with LN's. Emphasis on axilla and medial breast      05/05/2022 Pulleys x 2 min flexion and abduction  [id number] performed short neck, 5 diaphragmatic breaths, activated right axillary and inguinal LN's, right axillo-inguinal pathway and right breast lateral to pathway, then medial right breast to lateral and pathway ending with LN's. Emphasis on axilla and medial breast   04/29/2022  [id number]:[id number] performed short neck, 5 diaphragmatic breaths, activated right axillary and inguinal LN's, right axillo-inguinal pathway and right breast lateral to pathway, then medial right breast to lateral and pathway ending with LN's. Therapist then did the same steps but on the left breast. PROM bilateral shoulders flex, scaption ,abd MFR to right axillary area of cording AROM bilateral shoulder flexion, scaption, horizontal abd.   04/12/2022 Pt observed with increased swelling right axillary region with continued swelling at bilateral breast. Adjusted pts bra up to the last notch to try and put compression more under the arm. Therapist performed short neck, 5 diaphragmatic breaths, activated right axillary and inguinal LN's, right axillo-inguinal pathway and right breast lateral to pathway, then medial right breast to lateral and pathway. Pt was instructed in short neck, breaths, LN activation and pathway and was given  written handout. Pt was found to have several painful cords in the right axillary region. MFR was performed to cords with arm in scaption. PROM with MFR was performed for right shoulder flexion and abd.  03/31/2022 Pulleys x 1:30 flex, scaption, abduction Supine wand flexion and scaption x 5 ea, star gazer x 5 Measured bilateral shoulder ROM and checked goals Made spaghetti foam pads for medial side of bilateral breast where enlarged pores and fibrosis are present.  Marland Kitchen  PATIENT EDUCATION:  Education details: POC, Flexi touch, compression bra and gave script. (Pt in agreement with sending demo to [facility]), supine wand, Access Code: [email or web address] G URL: [email or web address] Date: 05/10/2022 Prepared by: Cheral Almas  Exercises - Scapular Retraction with Resistance  - 1 x daily - 7 x weekly - 1 sets - 10 reps - Scapular Retraction with Resistance Advanced  - 1 x daily - 7 x weekly - 1 sets - 10 reps - Shoulder External Rotation and Scapular Retraction with Resistance  - 1 x daily - 7 x weekly - 1 sets - 10 reps Person educated: Patient Education method: Explanation Education comprehension: verbalized understanding   HOME EXERCISE PROGRAM: Eval :None given today due to time  ASSESSMENT:  CLINICAL IMPRESSION: Therapist performed all in clinic today as pt was recovering from a hard week with sickness due to new meds and having to have 3 rounds of IV fluids. Tightness noted especially bilateral pectorals greatest on right.. Left breast with minimal swelling today and right breast also significantly improved but continues with enlarged pores and fibrosis medially.  OBJECTIVE IMPAIRMENTS decreased activity tolerance, decreased balance, decreased knowledge of condition, difficulty walking, decreased ROM, increased edema, increased fascial restrictions,  impaired sensation, impaired UE functional use, postural dysfunction, and pain.   ACTIVITY LIMITATIONS carrying, lifting,  sleeping, bathing, dressing, and reach over head  PARTICIPATION LIMITATIONS: cleaning, occupation, and yard work  PERSONAL FACTORS 3+ comorbidities: Bilateral breast Cancer with chemo/radiation, pain management,  are also affecting patient's functional outcome.   REHAB POTENTIAL: Good  CLINICAL DECISION MAKING: Stable/uncomplicated  EVALUATION COMPLEXITY: Low  GOALS: Goals reviewed with patient? Yes  SHORT TERM GOALS: Target date: 06/17/2022    Pt will be independent and compliant with a HEP for shoulder ROM and strength Baseline: Goal status: MET 2.  Pt will have improved bilateral shoulder flexion and abd to atleast 135 Baseline:  Goal status: MET 03/31/2022  3. Pt will have Flexi touch trial for breast lymphedema In Progress:MET  LONG TERM GOALS: Target date: 07/08/2022    Pt will have bilateral shoulder flexion atleast 145 degrees and abduction atleast 160 degrees for improved reaching ability Baseline:  Goal status: MET left 03/31/2022, progressing Right MET Right 05/05/2022  2.  Pts quick dash will be improved to no greater than 25% for improved function Baseline: 33% today Goal status: in PROGRESS 3.  Pt will be independent in self MLD to decrease bilateral breast swelling Baseline:  Goal status: In Progress  4.  Pt will purchase a compression bra to decrease breast swelling Baseline:  Goal status: MET 5.  Pt will be able to dress, bathe and perform reaching activities with minimal pain. Baseline:  Goal status: MET 05/05/2022  6. Pt will receive flexitouch sequential compression pump and will be independent in its use to decrease bilateral breast edema.  Baseline:hasn't received  Goal status New 7. Pt will improve SLS to 10 sec each leg to demonstrate improved balance and decrease risk of falls  Baseline 2-3 sec each side  Goal status:NEW  PLAN: PT FREQUENCY: 2x/week  PT DURATION:   weeks  PLANNED INTERVENTIONS: Therapeutic exercises, Therapeutic  activity, Neuromuscular re-education, Balance training, Patient/Family education, Self Care, Joint mobilization, Orthotic/Fit training, Manual lymph drainage, scar mobilization, Vasopneumatic device, Manual therapy, and Re-evaluation  PLAN FOR NEXT SESSION: pt to wear compression bra; , continue to review Bilateral breast MLD.still awaiting Flexitouch appropval, ROM and STM prn.    Claris Pong, PT 05/27/2022, 8:51 AM

## 2022-05-28 ENCOUNTER — Encounter: Payer: Self-pay | Admitting: Radiation Oncology

## 2022-05-31 ENCOUNTER — Inpatient Hospital Stay (HOSPITAL_BASED_OUTPATIENT_CLINIC_OR_DEPARTMENT_OTHER): Payer: Medicaid Other | Admitting: Adult Health

## 2022-05-31 ENCOUNTER — Other Ambulatory Visit: Payer: Self-pay

## 2022-05-31 ENCOUNTER — Encounter: Payer: Self-pay | Admitting: Adult Health

## 2022-05-31 VITALS — BP 140/72 | HR 83 | Temp 97.9°F | Resp 16 | Ht 71.0 in | Wt 220.7 lb

## 2022-05-31 DIAGNOSIS — C50411 Malignant neoplasm of upper-outer quadrant of right female breast: Secondary | ICD-10-CM | POA: Diagnosis not present

## 2022-05-31 DIAGNOSIS — C7951 Secondary malignant neoplasm of bone: Secondary | ICD-10-CM

## 2022-05-31 NOTE — Progress Notes (Signed)
[facility] Cancer Follow up:    Grayce Sessions, NP [name] [place] [address]   DIAGNOSIS:  Cancer Staging  Malignant neoplasm of upper-outer quadrant of left breast in female, estrogen receptor positive (HCC) Staging form: Breast, AJCC 8th Edition - Clinical stage from 12/17/2020: Stage IA (cT1b, cN0, cM0, G2, ER+, PR+, HER2-) - Signed by Loa Socks, NP on 01/14/2022 Stage prefix: Initial diagnosis Histologic grading system: 3 grade system Laterality: Left Staged by: Pathologist and managing physician Stage used in treatment planning: Yes National guidelines used in treatment planning: Yes Type of national guideline used in treatment planning: NCCN - Pathologic stage from 07/09/2021: No Stage Recommended (ypTis (DCIS), pN0, cM0) - Signed by Loa Socks, NP on 07/22/2021 Stage prefix: Post-therapy  Malignant neoplasm of upper-outer quadrant of right breast in female, estrogen receptor positive (HCC) Staging form: Breast, AJCC 8th Edition - Clinical stage from 12/17/2020: Stage IIA (cT2, cN1, cM0, G2, ER+, PR+, HER2-) - Signed by Loa Socks, NP on 01/14/2022 Stage prefix: Initial diagnosis Histologic grading system: 3 grade system Laterality: Right Staged by: Pathologist and managing physician Stage used in treatment planning: Yes National guidelines used in treatment planning: Yes Type of national guideline used in treatment planning: NCCN - Pathologic stage from 07/09/2021: No Stage Recommended (ypT2, pN1a, cM0, G2) - Signed by Loa Socks, NP on 07/22/2021 Stage prefix: Post-therapy Histologic grading system: 3 grade system   SUMMARY OF ONCOLOGIC HISTORY: Oncology History  Malignant neoplasm of upper-outer quadrant of right breast in female, estrogen receptor positive (HCC)  12/12/2020 Initial Diagnosis   status post bilateral breast biopsies 12/03/2020, showing             (1) on the right, a  clinical T2 N1, stage IIa invasive lobular carcinoma, grade 2, estrogen and progesterone receptor strongly positive, HER2 not amplified, with an MIB-1 of 40%                         (a) the biopsied right axillary lymph node was positive with extracapsular extension                         (b) a second right breast mass also biopsied was a fibroadenoma, concordant  MAMMAPRINT tested on biopsy returned high risk, luminal type B, indicating significant benefit from chemo                         (c) biopsy of an area of non-mass-like enhancement in the upper right breast pending   12/17/2020 Cancer Staging   Staging form: Breast, AJCC 8th Edition - Clinical stage from 12/17/2020: Stage IIA (cT2, cN1, cM0, G2, ER+, PR+, HER2-) - Signed by Loa Socks, NP on 01/14/2022 Stage prefix: Initial diagnosis Histologic grading system: 3 grade system Laterality: Right Staged by: Pathologist and managing physician Stage used in treatment planning: Yes National guidelines used in treatment planning: Yes Type of national guideline used in treatment planning: NCCN   12/24/2020 Genetic Testing   Negative genetic testing:  No pathogenic variants detected on the [employer] BRCAplus panel (report date 12/24/2020) or the CancerNext-Expanded + RNAinsight panel (report date 12/31/2020). 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   The BRCAplus panel offered by [employer] and includes sequencing and deletion/duplication analysis for the following 8 genes: ATM, BRCA1, BRCA2, CDH1, CHEK2,  PALB2, PTEN, and TP53. The CancerNext-Expanded + RNAinsight gene panel offered by [employer] and includes sequencing and rearrangement analysis for the following 77 genes: AIP, ALK, APC, ATM, AXIN2, BAP1, BARD1, BLM, BMPR1A, BRCA1, BRCA2, BRIP1, CDC73, CDH1, CDK4, CDKN1B, CDKN2A, CHEK2, CTNNA1, DICER1, FANCC, FH, FLCN, GALNT12, KIF1B, LZTR1, MAX, MEN1, MET, MLH1, MSH2, MSH3,  MSH6, MUTYH, NBN, NF1, NF2, NTHL1, PALB2, PHOX2B, PMS2, POT1, PRKAR1A, PTCH1, PTEN, RAD51C, RAD51D, RB1, RECQL, RET, SDHA, SDHAF2, SDHB, SDHC, SDHD, SMAD4, SMARCA4, SMARCB1, SMARCE1, STK11, SUFU, TMEM127, TP53, TSC1, TSC2, VHL and XRCC2 (sequencing and deletion/duplication); EGFR, EGLN1, HOXB13, KIT, MITF, PDGFRA, POLD1 and POLE (sequencing only); EPCAM and GREM1 (deletion/duplication only). RNA data is routinely analyzed for use in variant interpretation for all genes.   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 Multifocal sclerotic bony metastatic disease greatest in the thoracic spine with scattered rib lesions and sclerosis as well. 2. No nodal or visceral metastasis about the chest. 3. Collateral pathways about the chest suggest some subclavian venous narrowing on the LEFT. This could also be related to arm position. Correlate clinically. 4. Signs of RIGHT breast lumpectomy. Potential postoperative changes in the LEFT breast with small focal area of  nodularity in the lateral LEFT breast which could also be postoperative, correlate clinically and consider mammographic correlation as warranted. 5. Mild cardiac enlargement.   05/18/2022 Treatment Plan Change   Anastrozole + Verzenio 100mg  PO BID   Malignant neoplasm of upper-outer quadrant of left breast in female, estrogen receptor positive (HCC)  12/12/2020 Initial Diagnosis   on the left, a clinical T1b N0, stage IA invasive ductal carcinoma, grade 1 or 2, estrogen and progesterone receptor positive, HER2 not amplified, with an MIB 1 of 15%   12/17/2020 Cancer Staging   Staging form: Breast, AJCC 8th Edition - Clinical stage from 12/17/2020: Stage IA (cT1b, cN0, cM0, G2, ER+, PR+, HER2-) - Signed by Loa Socks, NP on 01/14/2022 Stage prefix: Initial diagnosis Histologic grading system: 3 grade system Laterality: Left Staged by: Pathologist and managing physician Stage used in treatment planning: Yes National guidelines used in treatment planning: Yes Type of national guideline used in treatment planning: NCCN   12/24/2020 Genetic Testing   Negative genetic testing:  No pathogenic variants detected on the [employer] BRCAplus panel (report date 12/24/2020) or the CancerNext-Expanded + RNAinsight panel (report date 12/31/2020). 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   The BRCAplus panel offered by [employer] and includes sequencing and deletion/duplication analysis for the following 8 genes: ATM, BRCA1, BRCA2, CDH1,  CHEK2, PALB2, PTEN, and TP53. The CancerNext-Expanded + RNAinsight gene panel offered by [employer] and includes sequencing and rearrangement analysis for the following 77 genes: AIP, ALK, APC, ATM, AXIN2, BAP1, BARD1, BLM, BMPR1A, BRCA1, BRCA2, BRIP1, CDC73, CDH1, CDK4, CDKN1B, CDKN2A, CHEK2, CTNNA1, DICER1, FANCC, FH, FLCN, GALNT12, KIF1B, LZTR1, MAX, MEN1, MET, MLH1, MSH2, MSH3, MSH6, MUTYH, NBN, NF1, NF2,  NTHL1, PALB2, PHOX2B, PMS2, POT1, PRKAR1A, PTCH1, PTEN, RAD51C, RAD51D, RB1, RECQL, RET, SDHA, SDHAF2, SDHB, SDHC, SDHD, SMAD4, SMARCA4, SMARCB1, SMARCE1, STK11, SUFU, TMEM127, TP53, TSC1, TSC2, VHL and XRCC2 (sequencing and deletion/duplication); EGFR, EGLN1, HOXB13, KIT, MITF, PDGFRA, POLD1 and POLE (sequencing only); EPCAM and GREM1 (deletion/duplication only). RNA data is routinely analyzed for use in variant interpretation for all genes.   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 Multifocal sclerotic bony metastatic disease greatest in the thoracic spine with scattered rib lesions and sclerosis as well. 2. No nodal or visceral metastasis about the chest. 3. Collateral pathways about the chest suggest some subclavian venous narrowing on the LEFT. This could also be related to arm position. Correlate clinically. 4. Signs of RIGHT breast lumpectomy. Potential postoperative changes in the LEFT breast with small focal area of nodularity in the lateral LEFT breast which could also be  postoperative, correlate clinically and consider mammographic correlation as warranted. 5. Mild cardiac enlargement.   05/18/2022 Treatment Plan Change   Anastrozole + Verzenio 100mg  PO BID     CURRENT THERAPY: Anastrozole and Verzenio  INTERVAL HISTORY: Chelsea Hansen 51 y.o. female returns for f/u of her metastatic breast cancer.  She is here today to talk about her frustration with her diagnosis and wondering how her cancer became stage IV.  She is accompanied by her son.    She continues to take the Verzenio BID at 50mg  and continues to have nuasea.  She has been taking the zofran after she takes Verzenio and she is eating and drinking.  She has difficulty with water because she doesn't like the taste of water.     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   GERD (gastroesophageal reflux disease) 05/27/2021   Hyperlipidemia 05/27/2021   Hypertension 05/27/2021   Pain management contract signed 05/04/2021   Chronic pain syndrome (breast cancer) 05/04/2021   Palpitations 03/25/2021   Sinus bradycardia 03/25/2021   Daytime somnolence 03/25/2021   Snoring 03/25/2021   Obesity (BMI 30-39.9) 03/25/2021   Port-A-Cath in place 01/27/2021   Genetic testing 12/24/2020  Family history of uterine cancer 12/17/2020   Malignant neoplasm of upper-outer quadrant of right breast in female, estrogen receptor positive (HCC) 12/12/2020   Malignant neoplasm of upper-outer quadrant of left breast in female, estrogen receptor positive (HCC) 12/12/2020   Hypokalemia 01/02/2012   Asthma 01/01/2012   Bradycardia 01/01/2012   Syncope 12/31/2011   Mediastinal adenopathy 12/31/2011   Anemia 12/31/2011   Chest pain 12/31/2011    has No Known Allergies.  MEDICAL HISTORY: Past Medical History:  Diagnosis Date   Allergy    seasonal allergies    Anxiety    on meds   Asthma    uses inhaler   Breast cancer (HCC) 2012   RIGHT lumpectomy   Cancer (HCC) 2022   RIGHT breast lump-dx 2022   Depression    on meds   DVT (deep venous thrombosis) (HCC) 2010   after hysterectomy   Family history of uterine cancer    GERD (gastroesophageal reflux disease)    with certain foods/OTC PRN meds   Headache(784.0)    History of radiation therapy    Bilateral breast- 09/03/21-11/02/21- Dr. Antony Blackbird   Hyperlipidemia    on meds   Hypertension    on meds   SVT (supraventricular tachycardia)     SURGICAL HISTORY: Past Surgical History:  Procedure Laterality Date   ABDOMINAL HYSTERECTOMY     AXILLARY SENTINEL NODE BIOPSY Left 07/09/2021   Procedure: LEFT AXILLARY SENTINEL NODE BIOPSY;  Surgeon: Abigail Miyamoto, MD;  Location: [facility];  Service: General;  Laterality: Left;   BREAST EXCISIONAL BIOPSY Right 09/16/2009   BREAST LUMPECTOMY WITH RADIOACTIVE SEED LOCALIZATION Bilateral 07/09/2021   Procedure: BILATERAL BREAST LUMPECTOMY WITH RADIOACTIVE SEED LOCALIZATION;  Surgeon: Abigail Miyamoto, MD;  Location: [facility];  Service: General;  Laterality: Bilateral;   BREAST SURGERY     lumpectomy   PORT-A-CATH REMOVAL Left 07/09/2021   Procedure: REMOVAL PORT-A-CATH;  Surgeon: Abigail Miyamoto, MD;  Location: [facility];  Service: General;  Laterality: Left;   PORTACATH PLACEMENT Left 01/26/2021   Procedure: INSERTION PORT-A-CATH;  Surgeon: Abigail Miyamoto, MD;  Location: [facility];  Service: General;  Laterality: Left;   RADIOACTIVE SEED GUIDED AXILLARY SENTINEL LYMPH NODE Right 07/09/2021   Procedure: RADIOACTIVE SEED GUIDED RIGHT AXILLARY SENTINEL LYMPH NODE DISSECTION;  Surgeon: Abigail Miyamoto, MD;  Location: [facility];  Service: General;  Laterality: Right;   TUBAL LIGATION      SOCIAL HISTORY: Social History   Socioeconomic History   Marital status: Single     Spouse name: Not on file   Number of children: Not on file   Years of education: Not on file   Highest education level: Not on file  Occupational History   Not on file  Tobacco Use   Smoking status: Never   Smokeless tobacco: Never  Vaping Use   Vaping Use: Never used  Substance and Sexual Activity   Alcohol use: Not Currently    Alcohol/week: 7.0 standard drinks of alcohol    Types: 7 Standard drinks or equivalent per week    Comment: occassional   Drug use: No   Sexual activity: Not Currently  Other Topics Concern   Not on file  Social History Narrative   Not on file   Social Determinants of Health   Financial Resource Strain: High Risk (01/14/2022)   Overall Financial Resource Strain (CARDIA)    Difficulty of Paying Living Expenses: Very hard  Food Insecurity: Food Insecurity Present (  01/14/2022)   Hunger Vital Sign    Worried About Running Out of Food in the Last Year: Sometimes true    Ran Out of Food in the Last Year: Sometimes true  Transportation Needs: No Transportation Needs (12/02/2020)   PRAPARE - Administrator, Civil Service (Medical): No    Lack of Transportation (Non-Medical): No  Physical Activity: Not on file  Stress: Not on file  Social Connections: Not on file  Intimate Partner Violence: Not on file    FAMILY HISTORY: Family History  Problem Relation Age of Onset   Breast cancer Mother        32s   Hypertension Father    Uterine cancer Maternal Aunt    Ovarian cancer Maternal Aunt    Breast cancer Maternal Aunt        73s   Breast cancer Cousin 59   Uterine cancer Cousin 94   Colon polyps Neg Hx    Colon cancer Neg Hx    Esophageal cancer Neg Hx    Stomach cancer Neg Hx     Review of Systems  Constitutional:  Positive for fatigue. Negative for appetite change, chills, fever and unexpected weight change.  HENT:   Negative for hearing loss, lump/mass and trouble swallowing.   Eyes:  Negative for eye problems and icterus.   Respiratory:  Negative for chest tightness, cough and shortness of breath.   Cardiovascular:  Negative for chest pain, leg swelling and palpitations.  Gastrointestinal:  Positive for nausea. Negative for abdominal distention, abdominal pain, constipation, diarrhea and vomiting.  Endocrine: Negative for hot flashes.  Genitourinary:  Negative for difficulty urinating.   Musculoskeletal:  Negative for arthralgias.  Skin:  Negative for itching and rash.  Neurological:  Negative for dizziness, extremity weakness, headaches and numbness.  Hematological:  Negative for adenopathy. Does not bruise/bleed easily.  Psychiatric/Behavioral:  Negative for depression. The patient is not nervous/anxious.     PHYSICAL EXAMINATION  ECOG PERFORMANCE STATUS: 1 - Symptomatic but completely ambulatory  Vitals:   05/31/22 1223  BP: (!) 140/72  Pulse: 83  Resp: 16  Temp: 97.9 F (36.6 C)  SpO2: 100%    Physical Exam Constitutional:      General: She is not in acute distress.    Appearance: Normal appearance. She is not toxic-appearing.  HENT:     Head: Normocephalic and atraumatic.  Eyes:     General: No scleral icterus. Cardiovascular:     Rate and Rhythm: Normal rate and regular rhythm.     Pulses: Normal pulses.     Heart sounds: Normal heart sounds.  Pulmonary:     Effort: Pulmonary effort is normal.     Breath sounds: Normal breath sounds.  Abdominal:     General: Abdomen is flat. Bowel sounds are normal. There is no distension.     Palpations: Abdomen is soft.     Tenderness: There is no abdominal tenderness.  Musculoskeletal:        General: No swelling.     Cervical back: Neck supple.  Lymphadenopathy:     Cervical: No cervical adenopathy.  Skin:    General: Skin is warm and dry.     Findings: No rash.  Neurological:     General: No focal deficit present.     Mental Status: She is alert.  Psychiatric:        Mood and Affect: Mood normal.        Behavior: Behavior normal.  LABORATORY DATA:  CBC    Component Value Date/Time   WBC 5.0 05/26/2022 1405   WBC 3.6 (L) 06/08/2021 0817   RBC 4.79 05/26/2022 1405   HGB 11.5 (L) 05/26/2022 1405   HGB 11.5 10/06/2020 1432   HCT 36.3 05/26/2022 1405   HCT 36.8 10/06/2020 1432   PLT 307 05/26/2022 1405   PLT 323 10/06/2020 1432   MCV 75.8 (L) 05/26/2022 1405   MCV 73 (L) 10/06/2020 1432   MCH 24.0 (L) 05/26/2022 1405   MCHC 31.7 05/26/2022 1405   RDW 15.9 (H) 05/26/2022 1405   RDW 15.9 (H) 10/06/2020 1432   LYMPHSABS 1.2 05/26/2022 1405   LYMPHSABS 1.9 10/06/2020 1432   MONOABS 0.3 05/26/2022 1405   EOSABS 0.1 05/26/2022 1405   EOSABS 0.1 10/06/2020 1432   BASOSABS 0.0 05/26/2022 1405   BASOSABS 0.0 10/06/2020 1432    CMP     Component Value Date/Time   NA 142 05/26/2022 1405   NA 139 10/06/2020 1432   K 4.1 05/26/2022 1405   CL 106 05/26/2022 1405   CO2 28 05/26/2022 1405   GLUCOSE 65 (L) 05/26/2022 1405   BUN 16 05/26/2022 1405   BUN 11 10/06/2020 1432   CREATININE 1.08 (H) 05/26/2022 1405   CALCIUM 9.2 05/26/2022 1405   PROT 7.5 05/26/2022 1405   PROT 6.9 10/06/2020 1432   ALBUMIN 4.0 05/26/2022 1405   ALBUMIN 4.1 10/06/2020 1432   AST 16 05/26/2022 1405   ALT 19 05/26/2022 1405   ALKPHOS 89 05/26/2022 1405   BILITOT 0.4 05/26/2022 1405   GFRNONAA >60 05/26/2022 1405   GFRAA >60 09/07/2019 1340       ASSESSMENT and THERAPY PLAN:   Metastasis to bone (HCC) Alasha is a 51 year old woman with recently diagnosed metastatic breast cancer.  She had difficulty with tolerating the Verzenio and therefore she has been dose reduced to 50 mg twice a day with Zofran to take before hand.   Jaiyda and I spent a good deal of time today discussing her diagnosis, prognosis, and how the cancer can spread after initial diagnosis treatment and during adjuvant therapy.    She did not take any Zofran before taking the Verzenio today and I recommended that she started to take her nausea medicine  about 30 minutes before she takes her Verzenio to see if that will prevent the nausea from happening in the first place.  She plans on trying it like this.  We discussed that she will have scans on December 14th with the PET scan and we will see Dr. Pamelia Hoit on December 14.  I have set a reminder so that I can reach out to radiology to make sure that her scans get read in time for her follow-up visit.  Dellah will return on Thursday for follow-up with Dr. Pamelia Hoit she knows to reach out for any questions or concerns in the meantime.    All questions were answered. The patient knows to call the clinic with any problems, questions or concerns. We can certainly see the patient much sooner if necessary.  Total encounter time:45 minutes*in face-to-face visit time, chart review, lab review, care coordination, order entry, and documentation of the encounter time.    Lillard Anes, NP 06/02/22 1:21 PM Medical Oncology and Hematology [facility] [address] [place], [place] [address] Tel. [phone]    Fax. [phone]  *Total Encounter Time as defined by the Centers for Medicare and Medicaid Services includes, in addition to the  face-to-face time of a patient visit (documented in the note above) non-face-to-face time: obtaining and reviewing outside history, ordering and reviewing medications, tests or procedures, care coordination (communications with other health care professionals or caregivers) and documentation in the medical record.

## 2022-06-01 ENCOUNTER — Telehealth: Payer: Self-pay | Admitting: Adult Health

## 2022-06-01 ENCOUNTER — Ambulatory Visit: Payer: Medicaid Other

## 2022-06-01 NOTE — Telephone Encounter (Signed)
Scheduled appointment per 12/12 los. Left voicemail.

## 2022-06-02 ENCOUNTER — Encounter: Payer: Self-pay | Admitting: Hematology and Oncology

## 2022-06-02 ENCOUNTER — Ambulatory Visit
Admission: RE | Admit: 2022-06-02 | Discharge: 2022-06-02 | Disposition: A | Discharge status: Home / Self Care | Payer: Medicaid Other | Source: Ambulatory Visit | Attending: Radiation Oncology | Admitting: Radiation Oncology

## 2022-06-02 DIAGNOSIS — M545 Low back pain, unspecified: Secondary | ICD-10-CM | POA: Diagnosis not present

## 2022-06-02 DIAGNOSIS — C7951 Secondary malignant neoplasm of bone: Secondary | ICD-10-CM | POA: Diagnosis not present

## 2022-06-02 DIAGNOSIS — C50919 Malignant neoplasm of unspecified site of unspecified female breast: Secondary | ICD-10-CM | POA: Diagnosis present

## 2022-06-02 DIAGNOSIS — K5730 Diverticulosis of large intestine without perforation or abscess without bleeding: Secondary | ICD-10-CM | POA: Diagnosis not present

## 2022-06-02 DIAGNOSIS — I700 Atherosclerosis of aorta: Secondary | ICD-10-CM | POA: Diagnosis not present

## 2022-06-02 LAB — GLUCOSE, CAPILLARY: Glucose-Capillary: 108 mg/dL — ABNORMAL HIGH (ref 70–99)

## 2022-06-02 MED ORDER — FLUDEOXYGLUCOSE F - 18 (FDG) INJECTION
11.8900 | Freq: Once | INTRAVENOUS | Status: AC | PRN
  Administered 2022-06-02: 11.89 via INTRAVENOUS

## 2022-06-02 NOTE — Progress Notes (Signed)
Radiation Oncology         [phone] ________________________________  Name: Chelsea Hansen MRN: [id number]  Date: 06/03/2022  DOB: [birth year]  Follow-Up Visit Note  CC: Grayce Sessions, NP  Serena Croissant, MD    ICD-10-CM   1. Malignant neoplasm of upper-outer quadrant of left breast in female, estrogen receptor positive (HCC) [C50.412, Z17.0]  C50.412    Z17.0     2. Malignant neoplasm of upper-outer quadrant of right breast in female, estrogen receptor positive (HCC) [C50.411, Z17.0]  C50.411    Z17.0     3. Metastasis to bone [facility])  C79.51 MR Lumbar Spine W Wo Contrast      Diagnosis: The primary encounter diagnosis was Carcinoma of breast metastatic to bone, unspecified laterality (HCC). Diagnoses of Malignant neoplasm of upper-outer quadrant of right breast in female, estrogen receptor positive (HCC), Malignant neoplasm of upper-outer quadrant of left breast in female, estrogen receptor positive (HCC), and Metastasis to bone [facility]) were also pertinent to this visit.   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   Since she was last seen for re-evaluation on 05/20/22, the patient stopped verzenio on 05/24/22 secondary to dehydration from significant nausea and vomiting since she started taking verzenio. To manage this, she received IV fluids daily for 3 days along with IV zofran and IV famotidine. She was also prescribed Compazine and Zofran to take as needed, as well as protonix daily.          During her most recent follow-up visit with medical oncology on 05/31/22, the patient reported persistent nausea from verzenio, even with zofran use afterwards. She was advised to try taking zofran 30 minutes before her verzenio to try and prevent nausea from occurring.        PET scan performed yesterday (06/02/22) demonstrated postsurgical changes related to bilateral breast lumpectomies without evidence of hypermetabolic local recurrence . PET also showed no abnormal FDG avidity associated with the multifocal sclerotic and mixed lytic/sclerotic osseous lesions throughout the axial and proximal appendicular skeleton, and no evidence of hypermetabolic soft tissue metastatic disease.      On evaluation today the patient continues to have pain in her lower back area.  She denies any significant pain in the right pelvis area at this time.  She points to lower lumbar spine is the most significant area.              Allergies:  has No Known Allergies.  Meds: Current Outpatient Medications  Medication Sig Dispense Refill   abemaciclib (VERZENIO) 50 MG tablet Take 1 tablet (50 mg total) by mouth 2 (two) times  daily. 60 tablet 3   albuterol (VENTOLIN HFA) 108 (90 Base) MCG/ACT inhaler Inhale 2 puffs by mouth into the lungs every 6 hours as needed for wheezing or shortness of breath. 18 g 2   amLODipine (NORVASC) 10 MG tablet Take 1 tablet  by mouth daily. 90 tablet 1   anastrozole (ARIMIDEX) 1 MG tablet Take 1 tablet (1 mg total) by mouth daily. 90 tablet 3   ARIPiprazole (ABILIFY) 15 MG tablet Take 1  tablet by mouth at bedtime 30 tablet 0   escitalopram (LEXAPRO) 20 MG tablet Take 1 tablet (20 mg total) by mouth daily. 90 tablet 1   hydrochlorothiazide (HYDRODIURIL) 25 MG tablet Take 1 tablet by mouth daily. 30 tablet 0   montelukast (SINGULAIR) 10 MG tablet Take 1 tablet (10 mg total) by mouth at bedtime. 90 tablet 3   ondansetron (ZOFRAN-ODT) 8 MG disintegrating tablet Dissolve 1 tablet (8 mg total) by mouth every 8 (eight) hours as needed for nausea or vomiting. 20 tablet 0   oxyCODONE-acetaminophen (PERCOCET) 7.5-325 MG tablet Take 1 tablet by mouth every 8 (eight) hours as needed for severe pain. 102 tablet 0   pantoprazole (PROTONIX) 40 MG tablet Take 1 tablet (40 mg total) by mouth daily. 30 tablet 0   prochlorperazine (COMPAZINE) 10 MG tablet Take 1 tablet (10 mg total) by mouth every 6 (six) hours as needed for nausea or vomiting. 30 tablet 0   promethazine (PHENERGAN) 25 MG suppository Place 1 suppository (25 mg total) rectally every 6 (six) hours as needed for up to 3 days for nausea or vomiting. 12 suppository 0   propranolol (INDERAL) 10 MG tablet Take 1 tablet (10 mg total) by mouth 2 (two) times daily. 180 tablet 3   valsartan (DIOVAN) 40 MG tablet Take 1 tablet (40 mg total) by mouth daily. 90 tablet 3   No current facility-administered medications for this encounter.    Physical Findings: The patient is in no acute distress. Patient is alert and oriented.  height is 5\' 11"  (1.803 m) and weight is 221 lb (100.2 kg). Her temperature is 97.4 F (36.3 C) (abnormal). Her blood pressure is 144/94 (abnormal) and her pulse is 74. Her respiration is 20 and oxygen saturation is 99%. .  No significant changes. Lungs are clear to auscultation bilaterally. Heart has regular rate and rhythm. No palpable cervical, supraclavicular, or axillary adenopathy. Abdomen soft, non-tender, normal bowel sounds.  Lower motor strength 5 out of 5 in the proximal and distal muscle groups.     Lab  Findings: Lab Results  Component Value Date   WBC 5.0 05/26/2022   HGB 11.5 (L) 05/26/2022   HCT 36.3 05/26/2022   MCV 75.8 (L) 05/26/2022   PLT 307 05/26/2022    Radiographic Findings: NM PET Image Initial (PI) Skull Base To Thigh  Result Date: 06/02/2022 CLINICAL DATA:  Subsequent treatment strategy for breast cancer. EXAM: NUCLEAR MEDICINE PET SKULL BASE TO THIGH TECHNIQUE: 11.89 mCi F-18 FDG was injected intravenously. Full-ring PET imaging was performed from the skull base to thigh after the radiotracer. CT data was obtained and used for attenuation correction and anatomic localization. Fasting blood glucose: 108 mg/dl COMPARISON:  Multiple priors including most recent CT chest May 11, 2022 FINDINGS: Mediastinal blood pool activity: SUV max 1.1 Liver activity: SUV max NA NECK: No hypermetabolic cervical adenopathy. Incidental CT findings: None. CHEST: No hypermetabolic thoracic adenopathy. No hypermetabolic pulmonary nodules or masses. Postsurgical change of prior right breast lumpectomy with a non  hypermetabolic 2.2 x 1.5 cm soft tissue density in the surgical bed with overlying skin thickening on image 105/2, compatible with postsurgical change. Postsurgical change in the left breast with non hypermetabolic band of soft tissue density measuring 2.6 x 0.9 cm on image 99/2 compatible with postsurgical change. Incidental CT findings: Aortic atherosclerosis. Mild cardiac enlargement. Small hiatal hernia. ABDOMEN/PELVIS: No abnormal hypermetabolic activity within the liver, pancreas, adrenal glands, or spleen. No hypermetabolic lymph nodes in the abdomen or pelvis. Incidental CT findings: Colonic diverticulosis without findings of acute diverticulitis. SKELETON: No abnormal FDG avidity in the multifocal sclerotic and mixed lytic and sclerotic lesions involving the spine, ribs sacrum pelvic bones and bilateral proximal femurs. Incidental CT findings: None. IMPRESSION: 1. Postsurgical change of  bilateral breast lumpectomy without evidence of hypermetabolic local recurrence. 2. No abnormal FDG avidity associated with the multifocal sclerotic and mixed lytic/sclerotic osseous lesions throughout the axial and proximal appendicular skeleton. 3. No evidence of hypermetabolic soft tissue metastatic disease. 4. Colonic diverticulosis without findings of acute diverticulitis. 5.  Aortic Atherosclerosis (ICD10-I70.0). Electronically Signed   By: Maudry Mayhew M.D.   On: 06/02/2022 10:46   CT Chest W Contrast  Result Date: 05/13/2022 CLINICAL DATA:  A 51 year old female with history of breast cancer presents for follow-up. * Tracking Code: BO * EXAM: CT CHEST WITH CONTRAST TECHNIQUE: Multidetector CT imaging of the chest was performed during intravenous contrast administration. RADIATION DOSE REDUCTION: This exam was performed according to the departmental dose-optimization program which includes automated exposure control, adjustment of the mA and/or kV according to patient size and/or use of iterative reconstruction technique. CONTRAST:  75mL OMNIPAQUE IOHEXOL 300 MG/ML  SOLN COMPARISON:  July of 2018. FINDINGS: Cardiovascular: Mild cardiac enlargement. No aortic dilation. Central pulmonary vasculature is unremarkable on venous phase. Collateral pathways about the chest suggest some subclavian venous narrowing on the LEFT. This could also be related to arm position. Mediastinum/Nodes: No thoracic inlet, axillary, mediastinal or hilar adenopathy. Esophagus grossly normal. Lungs/Pleura: No effusion. No consolidative changes. Airways are patent. Basilar atelectasis. Upper Abdomen: Incidental imaging of upper abdominal contents without acute process. Imaged portions the liver, pancreas, spleen, adrenal glands and kidneys are unremarkable. Musculoskeletal: Sclerotic lesions in the spine not present on more remote imaging. Areas of sclerosis at multiple levels. Vertebral body involvement and posterior element  involvement at T9 for example with area of greatest involvement at this vertebral level. Sclerosis in the vertebral body measuring 2.7 x 1.4 cm. Posterior element sclerosis at T1. Involvement at T4, T5, T6, T7, T8, T9 as above, T10, T11 and T12. Sclerotic rib lesions also present in the bilateral chest most notably at the LEFT 6 posterior rib. No frankly destructive bone findings or acute bone process. Signs of RIGHT breast lumpectomy. Surgical clips in the LEFT breast as well, lateral to inferior surgical clips in the LEFT breast is an area of added density measuring 16 x 17 mm that is more focal than other areas. IMPRESSION: 1. Multifocal sclerotic bony metastatic disease greatest in the thoracic spine with scattered rib lesions and sclerosis as well. 2. No nodal or visceral metastasis about the chest. 3. Collateral pathways about the chest suggest some subclavian venous narrowing on the LEFT. This could also be related to arm position. Correlate clinically. 4. Signs of RIGHT breast lumpectomy. Potential postoperative changes in the LEFT breast with small focal area of nodularity in the lateral LEFT breast which could also be postoperative, correlate clinically and consider mammographic correlation as warranted. 5. Mild cardiac  enlargement. Electronically Signed   By: Donzetta Kohut M.D.   On: 05/13/2022 11:04    Impression: 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    We reviewed the recent PET scan findings in detail.  We discussed that there is no increased soft tissue or bony involvement of the  PET scan component,  however she has sclerotic multifocal metastasis throughout her body based on CT images from her PET scan.  To better evaluate the location of her pain I would recommend proceeding with MRI of the lumbosacral region.  She is in agreement and will schedule the study as soon as possible.    Plan: Follow-up after results of the patient's lumbosacral  MRI.   15 minutes of total time was spent for this patient encounter, including preparation, face-to-face counseling with the patient and coordination of care, physical exam, and documentation of the encounter. ____________________________________  Billie Lade, PhD, MD  This document serves as a record of services personally performed by Antony Blackbird, MD. It was created on his behalf by Neena Rhymes, a trained medical scribe. The creation of this record is based on the scribe's personal observations and the provider's statements to them. This document has been checked and approved by the attending provider.

## 2022-06-02 NOTE — Progress Notes (Signed)
Patient Care Team: [name], NP as PCP - General (Internal Medicine) [name], DO as PCP - Cardiology (Cardiology) [name], MD as Consulting Physician (Radiation Oncology) [name], MD as Consulting Physician (General Surgery) [name], MD as Consulting Physician (Hematology and Oncology)  DIAGNOSIS: No diagnosis found.  SUMMARY OF ONCOLOGIC HISTORY: Oncology History  Malignant neoplasm of upper-outer quadrant of right breast in female, estrogen receptor positive ([place])  12/12/2020 Initial Diagnosis   status post bilateral breast biopsies 12/03/2020, showing             (1) on the right, a clinical T2 N1, stage IIa invasive lobular carcinoma, grade 2, estrogen and progesterone receptor strongly positive, HER2 not amplified, with an MIB-1 of 40%                         (a) the biopsied right axillary lymph node was positive with extracapsular extension                         (b) a second right breast mass also biopsied was a fibroadenoma, concordant  MAMMAPRINT tested on biopsy returned high risk, luminal type B, indicating significant benefit from chemo                         (c) biopsy of an area of non-mass-like enhancement in the upper right breast pending   12/17/2020 Cancer Staging   Staging form: Breast, AJCC 8th Edition - Clinical stage from 12/17/2020: Stage IIA (cT2, cN1, cM0, G2, ER+, PR+, HER2-) - Signed by [name], NP on 01/14/2022 Stage prefix: Initial diagnosis Histologic grading system: 3 grade system Laterality: Right Staged by: Pathologist and managing physician Stage used in treatment planning: Yes National guidelines used in treatment planning: Yes Type of national guideline used in treatment planning: NCCN   12/24/2020 Genetic Testing   Negative genetic testing:  No pathogenic variants detected on the [facility] BRCAplus panel (report date 12/24/2020) or the CancerNext-Expanded + RNAinsight panel (report date 12/31/2020).  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   The BRCAplus panel offered by [employer] and includes sequencing and deletion/duplication analysis for the following 8 genes: ATM, BRCA1, BRCA2, CDH1, CHEK2, PALB2, PTEN, and TP53. The CancerNext-Expanded + RNAinsight gene panel offered by [employer] and includes sequencing and rearrangement analysis for the following 77 genes: AIP, ALK, APC, ATM, AXIN2, BAP1, BARD1, BLM, BMPR1A, BRCA1, BRCA2, BRIP1, CDC73, CDH1, CDK4, CDKN1B, CDKN2A, CHEK2, CTNNA1, DICER1, FANCC, FH, FLCN, GALNT12, KIF1B, LZTR1, MAX, MEN1, MET, MLH1, MSH2, MSH3, MSH6, MUTYH, NBN, NF1, NF2, NTHL1, PALB2, PHOX2B, PMS2, POT1, PRKAR1A, PTCH1, PTEN, RAD51C, RAD51D, RB1, RECQL, RET, SDHA, SDHAF2, SDHB, SDHC, SDHD, SMAD4, SMARCA4, SMARCB1, SMARCE1, STK11, SUFU, TMEM127, TP53, TSC1, TSC2, VHL and XRCC2 (sequencing and deletion/duplication); EGFR, EGLN1, HOXB13, KIT, MITF, PDGFRA, POLD1 and POLE (sequencing only); EPCAM and GREM1 (deletion/duplication only). RNA data is routinely analyzed for use in variant interpretation for all genes.   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 Multifocal sclerotic bony metastatic disease greatest in the thoracic spine with scattered rib lesions and sclerosis as well. 2. No nodal or visceral metastasis about the chest. 3. Collateral pathways about the chest suggest some subclavian venous narrowing on the LEFT. This could also be related to arm position. Correlate clinically. 4. Signs of RIGHT breast lumpectomy. Potential postoperative changes in the LEFT breast with small focal area of nodularity in the lateral LEFT breast which could also be postoperative, correlate clinically and consider mammographic correlation as warranted. 5. Mild cardiac enlargement.   05/18/2022 Treatment Plan Change   Anastrozole + Verzenio 141m PO BID   Malignant neoplasm of upper-outer quadrant of left breast in female, estrogen receptor positive ([place]  12/12/2020 Initial Diagnosis   on the left, a clinical T1b N0, stage IA invasive ductal carcinoma, grade 1 or 2, estrogen and progesterone receptor positive, HER2 not amplified, with an MIB 1 of 15%   12/17/2020 Cancer Staging   Staging form: Breast, AJCC 8th Edition - Clinical stage from 12/17/2020: Stage IA (cT1b, cN0, cM0, G2, ER+, PR+, HER2-) - Signed by CGardenia Phlegm NP on 01/14/2022 Stage prefix: Initial diagnosis Histologic grading system: 3 grade system Laterality: Left Staged by: Pathologist and managing physician Stage used in treatment planning: Yes National guidelines used in treatment planning: Yes Type of national guideline used in treatment planning: NCCN   12/24/2020 Genetic Testing   Negative genetic testing:  No pathogenic variants detected on the [facility] BRCAplus panel (report date 12/24/2020) or the CancerNext-Expanded + RNAinsight panel (report date 12/31/2020). A variant of uncertain  significance (VUS) was detected in the ATM gene called p.D44G (c.131A>G).   The BRCAplus panel offered by [employer] includes sequencing and deletion/duplication analysis for the following 8 genes: ATM, BRCA1, BRCA2, CDH1, CHEK2, PALB2, PTEN, and TP53. The CancerNext-Expanded + RNAinsight gene panel offered by [employer] includes sequencing and rearrangement analysis for the following 77 genes: AIP, ALK, APC, ATM, AXIN2, BAP1, BARD1, BLM, BMPR1A, BRCA1, BRCA2, BRIP1, CDC73, CDH1, CDK4, CDKN1B, CDKN2A, CHEK2, CTNNA1, DICER1, FANCC, FH, FLCN, GALNT12, KIF1B, LZTR1, MAX, MEN1, MET, MLH1, MSH2, MSH3, MSH6, MUTYH, NBN, NF1, NF2, NTHL1, PALB2, PHOX2B, PMS2, POT1, PRKAR1A, PTCH1, PTEN, RAD51C, RAD51D, RB1, RECQL, RET, SDHA, SDHAF2, SDHB, SDHC, SDHD, SMAD4, SMARCA4, SMARCB1, SMARCE1, STK11, SUFU, TMEM127, TP53, TSC1, TSC2, VHL and XRCC2 (sequencing and deletion/duplication); EGFR, EGLN1, HOXB13, KIT, MITF, PDGFRA, POLD1 and POLE (sequencing only); EPCAM and GREM1 (deletion/duplication only). RNA data is routinely analyzed for use in variant interpretation for all genes.   01/27/2021 -  Neo-Adjuvant Chemotherapy   Neoadjuvant chemotherapy with Doxorubicin and Cyclophosphamide given x 4 beginning 01/27/2021 and completing on 03/10/2021 followed by weekly paclitaxel x 12 beginning 03/24/2021   07/09/2021 Surgery   Right lumpectomy: Grade 2 invasive lobular cancer, 2.5 cm, LCIS, margins negative, LCIS focally at anterior margin, 1/1 lymph node positive, ER 95%, PR 100%, HER2 negative, Ki-67 40% Left lumpectomy: High-grade DCIS:  0.7 cm, margins negative, 0/1 lymph node negative ER 95%, PR 100%   07/09/2021 Cancer Staging   Staging form: Breast, AJCC 8th Edition - Pathologic stage from 07/09/2021: No Stage Recommended (ypTis (DCIS), pN0, cM0) - Signed by [name], NP on 07/22/2021 Stage prefix: Post-therapy   07/2021 -  Radiation Therapy   Adjuvant radiation to follow surgery   11/2021 -   Anti-estrogen oral therapy   Letrozole x 5-7 years; changed to anastrozole   05/13/2022 Imaging   CT chest  IMPRESSION: 1. Multifocal sclerotic bony metastatic disease greatest in the thoracic spine with scattered rib lesions and sclerosis as well. 2. No nodal or visceral metastasis about the chest. 3. Collateral pathways about the chest suggest some subclavian venous narrowing on the LEFT. This could also be related to arm position. Correlate clinically. 4. Signs of RIGHT breast lumpectomy. Potential postoperative changes in the LEFT breast with small focal area of nodularity in the lateral LEFT breast which could also be postoperative, correlate clinically and consider mammographic correlation as warranted. 5. Mild cardiac enlargement.   05/18/2022 Treatment Plan Change   Anastrozole + Verzenio 1106m PO BID     CHIEF COMPLIANT: Left breast cancer   INTERVAL HISTORY: Chelsea DONALDis a 51y.o. with a history of right breast cancer. Currently on anastrozole therapy. She presents to the clinic for a follow-up.   ALLERGIES:  has No Known Allergies.  MEDICATIONS:  Current Outpatient Medications  Medication Sig Dispense Refill   abemaciclib (VERZENIO) 50 MG tablet Take 1 tablet (50 mg total) by mouth 2 (two) times daily. 60 tablet 3   albuterol (VENTOLIN HFA) 108 (90 Base) MCG/ACT inhaler Inhale 2 puffs by mouth into the lungs every 6 hours as needed for wheezing or shortness of breath. 18 g 2   amLODipine (NORVASC) 10 MG tablet Take 1 tablet  by mouth daily. 90 tablet 1   anastrozole (ARIMIDEX) 1 MG tablet Take 1 tablet (1 mg total) by mouth daily. 90 tablet 3   ARIPiprazole (ABILIFY) 15 MG tablet Take 1 tablet by mouth at bedtime 30 tablet 0   escitalopram (LEXAPRO) 20 MG tablet Take 1 tablet (20 mg total) by mouth daily. 90 tablet 1   hydrochlorothiazide (HYDRODIURIL) 25 MG tablet Take 1 tablet by mouth daily. 30 tablet 0   montelukast (SINGULAIR) 10 MG tablet Take 1 tablet (10  mg total) by mouth at bedtime. 90 tablet 3   ondansetron (ZOFRAN-ODT) 8 MG disintegrating tablet Dissolve 1 tablet (8 mg total) by mouth every 8 (eight) hours as needed for nausea or vomiting. 20 tablet 0   oxyCODONE-acetaminophen (PERCOCET) 7.5-325 MG tablet Take 1 tablet by mouth every 8 (eight) hours as needed for severe pain. 102 tablet 0   pantoprazole (PROTONIX) 40 MG tablet Take 1 tablet (40 mg total) by mouth daily. 30 tablet 0   prochlorperazine (COMPAZINE) 10 MG tablet Take 1 tablet (10 mg total) by mouth every 6 (six) hours as needed for nausea or vomiting. 30 tablet 0   promethazine (PHENERGAN) 25 MG suppository Place 1 suppository (25 mg total) rectally every 6 (six) hours as needed for up to 3 days for nausea or vomiting. 12 suppository 0   propranolol (INDERAL) 10 MG tablet Take 1 tablet (10 mg total) by mouth 2 (two) times daily. 180 tablet 3   valsartan (DIOVAN) 40 MG tablet Take 1 tablet (40 mg total) by mouth daily. 90 tablet 3   No current facility-administered medications for this  visit.    PHYSICAL EXAMINATION: ECOG PERFORMANCE STATUS: {CHL ONC ECOG [phone]}  There were no vitals filed for this visit. There were no vitals filed for this visit.  BREAST:*** No palpable masses or nodules in either right or left breasts. No palpable axillary supraclavicular or infraclavicular adenopathy no breast tenderness or nipple discharge. (exam performed in the presence of a chaperone)  LABORATORY DATA:  I have reviewed the data as listed    Latest Ref Rng & Units 05/26/2022    2:05 PM 05/24/2022   12:06 PM 05/18/2022    1:20 PM  CMP  Glucose 70 - 99 mg/dL 65  100  86   BUN 6 - 20 mg/dL _0 Creatinine 0.44 - 1.00 mg/dL 1.08  1.16  0.84   Sodium 135 - 145 mmol/L 142  140  139   Potassium 3.5 - 5.1 mmol/L 4.1  4.1  3.6   Chloride 98 - 111 mmol/L 106  102  104   CO2 22 - 32 mmol/L 28  32  30   Calcium 8.9 - 10.3 mg/dL 9.2  10.1  9.6   Total Protein 6.5 - 8.1 g/dL  7.5  7.8  7.7   Total Bilirubin 0.3 - 1.2 mg/dL 0.4  0.4  0.4   Alkaline Phos 38 - 126 U/L 89  95  100   AST 15 - 41 U/L _1 ALT 0 - 44 U/L _2 Lab Results  Component Value Date   WBC 5.0 05/26/2022   HGB 11.5 (L) 05/26/2022   HCT 36.3 05/26/2022   MCV 75.8 (L) 05/26/2022   PLT 307 05/26/2022   NEUTROABS 3.3 05/26/2022    ASSESSMENT & PLAN:  No problem-specific Assessment & Plan notes found for this encounter.    No orders of the defined types were placed in this encounter.  The patient has a good understanding of the overall plan. she agrees with it. she will call with any problems that may develop before the next visit here. Total time spent: 30 mins including face to face time and time spent for planning, charting and co-ordination of care   Suzzette Righter, Elbert 06/02/22    I Gardiner Coins am acting as a Education administrator for [employer]  ***

## 2022-06-02 NOTE — Assessment & Plan Note (Signed)
Chelsea Hansen is a 51 year old woman with recently diagnosed metastatic breast cancer.  She had difficulty with tolerating the Verzenio and therefore she has been dose reduced to 50 mg twice a day with Zofran to take before hand.   Chelsea Hansen and I spent a good deal of time today discussing her diagnosis, prognosis, and how the cancer can spread after initial diagnosis treatment and during adjuvant therapy.    She did not take any Zofran before taking the Verzenio today and I recommended that she started to take her nausea medicine about 30 minutes before she takes her Verzenio to see if that will prevent the nausea from happening in the first place.  She plans on trying it like this.  We discussed that she will have scans on December 14th with the PET scan and we will see Dr. Lindi Adie on December 14.  I have set a reminder so that I can reach out to radiology to make sure that her scans get read in time for her follow-up visit.  Chelsea Hansen will return on Thursday for follow-up with Dr. Lindi Adie she knows to reach out for any questions or concerns in the meantime.

## 2022-06-03 ENCOUNTER — Other Ambulatory Visit: Payer: Self-pay

## 2022-06-03 ENCOUNTER — Ambulatory Visit
Admission: RE | Admit: 2022-06-03 | Discharge: 2022-06-03 | Disposition: A | Discharge status: Home / Self Care | Payer: Medicaid Other | Source: Ambulatory Visit | Attending: Radiation Oncology | Admitting: Radiation Oncology

## 2022-06-03 ENCOUNTER — Inpatient Hospital Stay (HOSPITAL_BASED_OUTPATIENT_CLINIC_OR_DEPARTMENT_OTHER): Payer: Medicaid Other | Admitting: Hematology and Oncology

## 2022-06-03 ENCOUNTER — Encounter: Payer: Self-pay | Admitting: Radiation Oncology

## 2022-06-03 ENCOUNTER — Telehealth: Payer: Self-pay | Admitting: Licensed Clinical Social Worker

## 2022-06-03 VITALS — BP 144/94 | HR 74 | Temp 97.4°F | Resp 20 | Ht 71.0 in | Wt 221.0 lb

## 2022-06-03 VITALS — BP 148/85 | HR 81 | Temp 97.3°F | Resp 18 | Ht 71.0 in | Wt 219.8 lb

## 2022-06-03 DIAGNOSIS — Z79811 Long term (current) use of aromatase inhibitors: Secondary | ICD-10-CM | POA: Insufficient documentation

## 2022-06-03 DIAGNOSIS — Z923 Personal history of irradiation: Secondary | ICD-10-CM | POA: Insufficient documentation

## 2022-06-03 DIAGNOSIS — C50411 Malignant neoplasm of upper-outer quadrant of right female breast: Secondary | ICD-10-CM | POA: Insufficient documentation

## 2022-06-03 DIAGNOSIS — Z9221 Personal history of antineoplastic chemotherapy: Secondary | ICD-10-CM | POA: Diagnosis not present

## 2022-06-03 DIAGNOSIS — Z170 Estrogen receptor positive status [ER+]: Secondary | ICD-10-CM | POA: Diagnosis not present

## 2022-06-03 DIAGNOSIS — Z79899 Other long term (current) drug therapy: Secondary | ICD-10-CM | POA: Diagnosis not present

## 2022-06-03 DIAGNOSIS — I517 Cardiomegaly: Secondary | ICD-10-CM | POA: Insufficient documentation

## 2022-06-03 DIAGNOSIS — C50412 Malignant neoplasm of upper-outer quadrant of left female breast: Secondary | ICD-10-CM | POA: Insufficient documentation

## 2022-06-03 DIAGNOSIS — C7951 Secondary malignant neoplasm of bone: Secondary | ICD-10-CM | POA: Diagnosis not present

## 2022-06-03 HISTORY — DX: Personal history of irradiation: Z92.3

## 2022-06-03 NOTE — Telephone Encounter (Signed)
TC from pt asking about who is helping with disability. Gave information for [facility] who CSW referred to on 12/1. Email sent to [facility] with [facility] to follow-up on referral.   Murlin Schrieber E Madden Piazza, LCSW

## 2022-06-03 NOTE — Progress Notes (Signed)
Chelsea Hansen is here today for follow up post radiation to the breast.   Breast Side:Bilateral breast   They completed their radiation on: 11/02/21  Does the patient complain of any of the following: Post radiation skin issues: Continues to have hyperpigmentation.  Breast Tenderness: Yes Breast Swelling: Yes Lymphadema: No Range of Motion limitations: Pain with lifting both arms.  Fatigue post radiation: Yes Appetite good/fair/poor: Good  Additional comments if applicable:  Patient reports having generalized pain rating 8/10. Reports most of pain is to back.   BP (!) 144/94 (BP Location: Right Arm, Patient Position: Sitting, Cuff Size: Large)   Pulse 74   Temp (!) 97.4 F (36.3 C)   Resp 20   Ht '5\' 11"'$  (1.803 m)   Wt 221 lb (100.2 kg)   SpO2 99%   BMI 30.82 kg/m

## 2022-06-03 NOTE — Assessment & Plan Note (Addendum)
12/03/2020: Bilateral breast biopsies: Right breast: T2N1 stage IIa grade 2 ILC, ER/PR positive, HER2 negative, Ki-67 40%, right axillary lymph node positive with extracapsular extension MammaPrint: High risk   01/27/2021 -06/08/2021 neoadjuvant chemotherapy with dose dense Adriamycin and Cytoxan x4 followed by Taxol x11   07/09/2021:Right lumpectomy: Grade 2 invasive lobular cancer, 2.5 cm, LCIS, margins negative, LCIS focally at anterior margin, 1/1 lymph node positive, ER 95%, PR 100%, HER2 negative, Ki-67 40% Left lumpectomy: High-grade DCIS: 0.7 cm, margins negative, 0/1 lymph node negative ER 95%, PR 100%  Right chest wall pain: CT chest 05/13/2022: Multifocal bone metastases thoracic spine and scattered rib lesions, no nodal or visceral metastases.  ----------------------------------------------------------------------------------------------------------------------------- Current treatment: Verzinio (started 05/21/2022) with anastrozole Verzinio dose: 50 p.o. twice daily Toxicities: Nausea: Taking premedications  06/02/2022: PET CT scan: No FDG activity in the multifocal sclerotic and mixed lytic and sclerotic lesions involving the spine, ribs, sacrum, pelvic bones and bilateral proximal femurs.  Continue with the current treatment Recommend patient receive Xgeva every 3 months to prevent fractures from bone lesions.

## 2022-06-04 ENCOUNTER — Ambulatory Visit: Payer: Medicaid Other

## 2022-06-04 ENCOUNTER — Telehealth: Payer: Self-pay | Admitting: *Deleted

## 2022-06-04 DIAGNOSIS — C50412 Malignant neoplasm of upper-outer quadrant of left female breast: Secondary | ICD-10-CM

## 2022-06-04 DIAGNOSIS — L599 Disorder of the skin and subcutaneous tissue related to radiation, unspecified: Secondary | ICD-10-CM

## 2022-06-04 DIAGNOSIS — C50411 Malignant neoplasm of upper-outer quadrant of right female breast: Secondary | ICD-10-CM | POA: Diagnosis not present

## 2022-06-04 DIAGNOSIS — R209 Unspecified disturbances of skin sensation: Secondary | ICD-10-CM

## 2022-06-04 DIAGNOSIS — I890 Lymphedema, not elsewhere classified: Secondary | ICD-10-CM

## 2022-06-04 DIAGNOSIS — R293 Abnormal posture: Secondary | ICD-10-CM

## 2022-06-04 NOTE — Therapy (Signed)
OUTPATIENT PHYSICAL THERAPY ONCOLOGY TREATMENT  Patient Name: Chelsea Hansen MRN: [id number] DOB:[birth year], 51 y.o., female Today's Date: 06/04/2022   PT End of Session - 06/04/22 0806     Visit Number 14    Number of Visits 20    Date for PT Re-Evaluation 06/20/22    Authorization Type Medicaid    Authorization Time Period 4    Authorization - Number of Visits 4    Progress Note Due on Visit 47    PT Start Time 0806   late   PT Stop Time 0850    PT Time Calculation (min) 44 min    Activity Tolerance Patient tolerated treatment well    Behavior During Therapy [facility] for tasks assessed/performed              Past Medical History:  Diagnosis Date   Allergy    seasonal allergies   Anxiety    on meds   Asthma    uses inhaler   Breast cancer ([place]) 2012   RIGHT lumpectomy   Cancer ([place]) 2022   RIGHT breast lump-dx 2022   Depression    on meds   DVT (deep venous thrombosis) ([place]) 2010   after hysterectomy   Family history of uterine cancer    GERD (gastroesophageal reflux disease)    with certain foods/OTC PRN meds   Headache(784.0)    History of radiation therapy    Bilateral breast- 09/03/21-11/02/21- Dr. [name]   Hyperlipidemia    on meds   Hypertension    on meds   SVT (supraventricular tachycardia)    Past Surgical History:  Procedure Laterality Date   ABDOMINAL HYSTERECTOMY     AXILLARY SENTINEL NODE BIOPSY Left 07/09/2021   Procedure: LEFT AXILLARY SENTINEL NODE BIOPSY;  Surgeon: [name], MD;  Location: [place];  Service: General;  Laterality: Left;   BREAST EXCISIONAL BIOPSY Right 09/16/2009   BREAST LUMPECTOMY WITH RADIOACTIVE SEED LOCALIZATION Bilateral 07/09/2021   Procedure: BILATERAL BREAST LUMPECTOMY WITH RADIOACTIVE SEED LOCALIZATION;  Surgeon: [name], MD;  Location: [place];  Service: General;  Laterality: Bilateral;   BREAST SURGERY     lumpectomy   PORT-A-CATH REMOVAL Left  07/09/2021   Procedure: REMOVAL PORT-A-CATH;  Surgeon: [name], MD;  Location: [place];  Service: General;  Laterality: Left;   PORTACATH PLACEMENT Left 01/26/2021   Procedure: INSERTION PORT-A-CATH;  Surgeon: [name], MD;  Location: [facility];  Service: General;  Laterality: Left;   RADIOACTIVE SEED GUIDED AXILLARY SENTINEL LYMPH NODE Right 07/09/2021   Procedure: RADIOACTIVE SEED GUIDED RIGHT AXILLARY SENTINEL LYMPH NODE DISSECTION;  Surgeon: [name], MD;  Location: [place];  Service: General;  Laterality: Right;   TUBAL LIGATION     Patient Active Problem List   Diagnosis Date Noted   Metastasis to bone ([place]) 05/20/2022   PSVT (paroxysmal supraventricular tachycardia) 07/08/2021   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   GERD (gastroesophageal reflux disease) 05/27/2021   Hyperlipidemia 05/27/2021   Hypertension 05/27/2021   Pain management contract signed 05/04/2021   Chronic pain syndrome (breast cancer) 05/04/2021   Palpitations 03/25/2021   Sinus bradycardia 03/25/2021   Daytime somnolence 03/25/2021   Snoring 03/25/2021   Obesity (BMI 30-39.9) 03/25/2021   Port-A-Cath in place 01/27/2021   Genetic testing 12/24/2020   Family history of uterine cancer 12/17/2020   Malignant neoplasm of upper-outer quadrant of right breast in female, estrogen receptor positive ([place]) 12/12/2020  Malignant neoplasm of upper-outer quadrant of left breast in female, estrogen receptor positive ([place]) 12/12/2020   Hypokalemia 01/02/2012   Asthma 01/01/2012   Bradycardia 01/01/2012   Syncope 12/31/2011   Mediastinal adenopathy 12/31/2011   Anemia 12/31/2011   Chest pain 12/31/2011    PCP: Juluis Mire,  REFERRING PROVIDER: Wilber Bihari, NP  REFERRING DIAG: Bilateral Breast swelling, decreased shoulder ROM bilaterally, Bilateral LE neuropathy  THERAPY DIAG:  Malignant neoplasm of upper-outer quadrant of right breast in  female, estrogen receptor positive ([place])  Malignant neoplasm of upper-outer quadrant of left breast in female, estrogen receptor positive ([place])  Abnormal posture  Disturbance of skin sensation  Disorder of the skin and subcutaneous tissue related to radiation, unspecified  Lymphedema, not elsewhere classified  ONSET DATE: 01/2021  Rationale for Evaluation and Treatment Rehabilitation  SUBJECTIVE                                                                                                                                                                                           SUBJECTIVE STATEMENT: I started getting nausea last night and I got sick last night, but today I still feel nausea. They haven't had any radiation yet. I have to have an MRI in about 3 weeks. They want me to have a nurse at home to be sure I am doing everything. The doctors want me to decrease to 2 days a week. I have to have an infusion every 3 months so my bones don't fx. The breast swelling comes and goes.   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 Yes NPRS scale: 5/10  Pain location:   back and legs Pain orientation: bilateral  PAIN TYPE: aching, burning, sharp, and tight Pain description: constant  Aggravating factors: household chores,lifting,sleeping,  Relieving factors: pain meds  PRECAUTIONS: Bilateral Lymphedema risk, neuropathy  WEIGHT BEARING RESTRICTIONS No  FALLS:  Has patient fallen in last 6 months? No  LIVING ENVIRONMENT: Lives  with: lives with her fiancee Lives in: House/apartment Stairs: Yes; Internal: 1 flight to basement steps; on left going up Has following equipment at home: Single point cane, Walker - 4 wheeled, shower chair, and bed side commode  OCCUPATION: mgr at [employer]'  LEISURE: shop, dominoes  HAND DOMINANCE : right   PRIOR LEVEL OF FUNCTION: Independent  PATIENT GOALS stop the pain,be able to reach without pain, be able to clean   OBJECTIVE  COGNITION:  Overall cognitive status: Within functional limits for tasks assessed   PALPATION: Very tender throughout bilateral UQ  OBSERVATIONS / OTHER ASSESSMENTS: Both breast with generalized swelling and enlarged pores  SENSATION:  Light touch: Deficits at lateral breast, medial arm inact     POSTURE: Forward head, rounded shoulders  UPPER EXTREMITY AROM/PROM:  A/PROM RIGHT   eval  Right 03/25/2022 Right 03/31/2022 Right 05/05/2022 Right 05/18/2022  Shoulder extension 42  48    Shoulder flexion 110 pulls under arm 148 150 159 164  Shoulder abduction 103 128 155 178 178  Shoulder internal rotation 55  60  68  Shoulder external rotation 79  93  99    (Blank rows = not tested)  A/PROM LEFT   eval Left 03/25/2022 Left 03/31/2022 Left  05/05/2022 LEFT 05/18/2022  Shoulder extension 52  52    Shoulder flexion 120 147 159 159 162  Shoulder abduction 115 154 168 178 178  Shoulder internal rotation 55  60  73  Shoulder external rotation 80  91  100    (Blank rows = not tested)   CERVICAL AROM: All within functional limits:     UPPER EXTREMITY STRENGTH: NT due to pain  LOWER EXTREMITY MMT: 4+/5  LYMPHEDEMA ASSESSMENTS:   SURGERY TYPE/DATE: 07/09/2021 BILATERAL BREAST LUMPECTOMY WITH RADIOACTIVE SEED LOCALIZATION RADIOACTIVE SEED GUIDED RIGHT AXILLARY TARGETED LYMPH NODE DISSECTION LEFT AXILLARY SENTINEL NODE BIOPSY LEFT AXILLARY LYMPH NODE MAPPING WITH SENTIMAG  NUMBER OF LYMPH NODES REMOVED: Right 1/1, Left  0/1  CHEMOTHERAPY: neoadjuvant  RADIATION:yes bilaterally  HORMONE TREATMENT: yes  INFECTIONS: no  LYMPHEDEMA ASSESSMENTS:   LANDMARK RIGHT  eval  10 cm proximal to olecranon process 30.5  Olecranon process 27.1  10 cm proximal to ulnar styloid process 21.2  Just proximal to ulnar styloid process 16.6  Across hand at thumb web space 21.4  At base of 2nd digit 6.8  (Blank rows = not tested)  LANDMARK LEFT  eval  10 cm proximal to olecranon process 30.7  Olecranon process 26.7  10 cm proximal to ulnar styloid process 20.5  Just proximal to ulnar styloid process 16.3  Across hand at thumb web space 21.1  At base of 2nd digit 6.7  (Blank rows = not tested)   FUNCTIONAL TESTS:   SLS ; 2-3 seconds ea leg GAIT: Distance walked: 20 Assistive device utilized: None Level of assistance: Complete Independence     QUICK DASH SURVEY: 77%,  03/31/2022: 31.82 BREAST COMPLAINTS SURVEY  :75  TODAY'S TREATMENT   06/04/2022   Pulleys for flexion, scaption and abduction x 2 min Tandem stance bilaterally x 2, SLS x 2 ea STM to bilateral UT, posterior cervicals. Pectorals, and lats in supine with cocoa butter. Supine wand x4 flex and scaption, stargazer x 4, wall slides x 4 ea side  05/27/2022 STM to bilateral UT, posterior cervicals. Pectorals, and lats in supine with cocoa butter [id number] performed short neck, 5 diaphragmatic breaths, activated right axillary and inguinal LN's, right axillo-inguinal pathway and right breast lateral to pathway, then medial right breast to lateral and pathway in supine  ending with LN's. Therapist then performed same but to left breast.   05/20/2022 Practiced balance activities: tandem stance bilaterally x 2 ea, and SLS Bilaterally x 4 ea. After practice pt able to maintain SLS for  a head count of 30. UJW:[id number] performed short neck, 5 diaphragmatic breaths, activated right axillary and inguinal LN's, right axillo-inguinal pathway and  right breast lateral to pathway, then medial right breast to lateral and pathway in supine  ending with LN's. Therapist demonstrated for pt and then pt performed each position multiple times.    05/18/2022 Pulleys flexion x 2 min, scaption x 1 min, abduction x 3 min FAO:[id number] performed short neck, 5 diaphragmatic breaths, activated right axillary and inguinal LN's, right axillo-inguinal pathway and right breast lateral to pathway, then medial right breast to lateral and pathway in supine and Left SL ending with LN's. Emphasis on axilla and medial breast   05/10/2022 Pulleys x 2 min flexion and abduction, 1 min scaption, ball rolls forward on wall x 10  Scapular Retraction with Resistance  10 reps-  - Scapular Retraction with Resistance Advanced  10 reps-  - Shoulder External Rotation and Scapular Retraction with Resistance10 reps ONG:[id number] performed short neck, 5 diaphragmatic breaths, activated right axillary and inguinal LN's, right axillo-inguinal pathway and right breast lateral to pathway, then medial right breast to lateral and pathway in supine and Left SL ending with LN's. Emphasis on axilla and medial breast    05/05/2022 Pulleys x 2 min flexion and abduction  GMW:[id number] performed short neck, 5 diaphragmatic breaths, activated right axillary and inguinal LN's, right axillo-inguinal pathway and right breast lateral to pathway, then medial right breast to lateral and pathway ending with LN's. Emphasis on axilla and medial breast   04/29/2022  YQI:[id number] performed short neck, 5 diaphragmatic breaths, activated right axillary and inguinal LN's, right axillo-inguinal pathway and right breast lateral to pathway, then medial right breast to lateral and pathway ending with LN's. Therapist then did the same steps but on the left breast. PROM bilateral shoulders flex, scaption ,abd MFR to right axillary area of cording AROM bilateral shoulder flexion, scaption, horizontal  abd.   04/12/2022 Pt observed with increased swelling right axillary region with continued swelling at bilateral breast. Adjusted pts bra up to the last notch to try and put compression more under the arm. Therapist performed short neck, 5 diaphragmatic breaths, activated right axillary and inguinal LN's, right axillo-inguinal pathway and right breast lateral to pathway, then medial right breast to lateral and pathway. Pt was instructed in short neck, breaths, LN activation and pathway and was given written handout. Pt was found to have several painful cords in the right axillary region. MFR was performed to cords with arm in scaption. PROM with MFR was performed for right shoulder flexion and abd.  03/31/2022 Pulleys x 1:30 flex, scaption, abduction Supine wand flexion and scaption x 5 ea, star gazer x 5 Measured bilateral shoulder ROM and checked goals Made spaghetti foam pads for medial side of bilateral breast where enlarged pores and fibrosis are present.  Marland Kitchen  PATIENT EDUCATION:  Education details: POC, Flexi touch, compression bra and gave script. (Pt in agreement with sending demo to [facility]), supine wand, Access Code: [email or web address] URL: [email or web address] Date: 05/10/2022 Prepared by: Cheral Almas  Exercises - Scapular Retraction with Resistance  - 1 x daily - 7 x weekly - 1 sets - 10 reps - Scapular Retraction with Resistance Advanced  - 1 x daily - 7 x weekly - 1 sets - 10 reps - Shoulder External Rotation and Scapular Retraction with Resistance  - 1 x daily - 7 x weekly - 1 sets - 10 reps Person educated: Patient Education method: Explanation Education comprehension: verbalized understanding  HOME EXERCISE PROGRAM: Eval :None given today due to time  ASSESSMENT:  CLINICAL IMPRESSION: Pt has been under a lot of stress lately and working long hours. Her shoulders and chest have tightened up and become more painful. Therapy addressed soft tissue  limitations and ROM limitations and pt was encouraged to continue  her ROM exercises 2x/day to improve pain levels. Tight and tender in bilateral pecs and lats. Also practiced balance which is improving but more difficult on the right.   OBJECTIVE IMPAIRMENTS decreased activity tolerance, decreased balance, decreased knowledge of condition, difficulty walking, decreased ROM, increased edema, increased fascial restrictions, impaired sensation, impaired UE functional use, postural dysfunction, and pain.   ACTIVITY LIMITATIONS carrying, lifting, sleeping, bathing, dressing, and reach over head  PARTICIPATION LIMITATIONS: cleaning, occupation, and yard work  PERSONAL FACTORS 3+ comorbidities: Bilateral breast Cancer with chemo/radiation, pain management,  are also affecting patient's functional outcome.   REHAB POTENTIAL: Good  CLINICAL DECISION MAKING: Stable/uncomplicated  EVALUATION COMPLEXITY: Low  GOALS: Goals reviewed with patient? Yes  SHORT TERM GOALS: Target date: 06/25/2022    Pt will be independent and compliant with a HEP for shoulder ROM and strength Baseline: Goal status: MET 2.  Pt will have improved bilateral shoulder flexion and abd to atleast 135 Baseline:  Goal status: MET 03/31/2022  3. Pt will have Flexi touch trial for breast lymphedema In Progress:MET  LONG TERM GOALS: Target date: 07/16/2022    Pt will have bilateral shoulder flexion atleast 145 degrees and abduction atleast 160 degrees for improved reaching ability Baseline:  Goal status: MET left 03/31/2022, progressing Right MET Right 05/05/2022  2.  Pts quick dash will be improved to no greater than 25% for improved function Baseline: 33% today Goal status: in PROGRESS 3.  Pt will be independent in self MLD to decrease bilateral breast swelling Baseline:  Goal status: In Progress  4.  Pt will purchase a compression bra to decrease breast swelling Baseline:  Goal status: MET 5.  Pt will be able to  dress, bathe and perform reaching activities with minimal pain. Baseline:  Goal status: MET 05/05/2022  6. Pt will receive flexitouch sequential compression pump and will be independent in its use to decrease bilateral breast edema.  Baseline:hasn't received  Goal status New 7. Pt will improve SLS to 10 sec each leg to demonstrate improved balance and decrease risk of falls  Baseline 2-3 sec each side  Goal status:NEW  PLAN: PT FREQUENCY: 2x/week  PT DURATION:   weeks  PLANNED INTERVENTIONS: Therapeutic exercises, Therapeutic activity, Neuromuscular re-education, Balance training, Patient/Family education, Self Care, Joint mobilization, Orthotic/Fit training, Manual lymph drainage, scar mobilization, Vasopneumatic device, Manual therapy, and Re-evaluation  PLAN FOR NEXT SESSION: pt to wear compression bra; ,STM for tightness prn continue to review Bilateral breast MLD.still awaiting Flexitouch appropval, ROM and STM prn.    Claris Pong, PT 06/04/2022, 8:52 AM

## 2022-06-04 NOTE — Telephone Encounter (Signed)
CALLED PATIENT TO INFORM OF MRI FOR 06-06-22- ARRIVAL TIME- 7:30 AM @ [facility], NO RESTRICTIONS TO TEST, PATIENT TO RECEIVE RESULTS FROM DR. KINARD ON 06-07-22 @ 11 AM, SPOKE WITH PATIENT AND SHE IS AWARE OF THIS SCAN AND THE INSTRUCTIONS AND THE FU

## 2022-06-05 NOTE — Progress Notes (Signed)
Radiation Oncology         ([phone]) [phone] ________________________________  Name: Chelsea Hansen MRN: [id number]  Date: 06/07/2022  DOB: 20-Dec-1970  Follow-Up Visit Note  CC: [name], NP  [name], MD  No diagnosis found.  Diagnosis: The primary encounter diagnosis was Carcinoma of breast metastatic to bone, unspecified laterality ([place]). Diagnoses of Malignant neoplasm of upper-outer quadrant of right breast in female, estrogen receptor positive ([place]), Malignant neoplasm of upper-outer quadrant of left breast in female, estrogen receptor positive ([place]), and Metastasis to bone [facility]) were also pertinent to this visit.   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, ER+, /PR+, Her2 not assessed  Interval Since Last Radiation: 7 months and 3 days   Intent: Curative  Radiation Treatment Dates: 09/03/2021 through 11/02/2021 Site Technique Total Dose (Gy) Dose per Fx (Gy) Completed Fx Beam Energies  Breast, Right: Breast_R 3D 50.4/50.4 1.8 28/28 10XFFF  Breast, Right: Breast_R_Bst 3D 10/10 2 5/5 6X  Sclav-RT: SCV_R 3D 50.4/50.4 1.8 28/28 6X, 10X  Breast, Left: Breast_L 3D 50.4/50.4 1.8 28/28 10XFFF  Breast, Left: Breast_L_Bst 3D 12/12 2 6/6 6X, 10X   Narrative:  The patient returns today for routine follow-up and to review recent MRI results, she was last seen here for follow-up on 06/03/22. To review from out last visit, I discussed proceeding with an MRI of the lumbosacral region to better evaluate/determine the location of her pain.         MRI of the lumbar spine on 06/06/22 showed ***.   (She continues to take verzenio with anastrozole).                         Allergies:  has No  Known Allergies.  Meds: Current Outpatient Medications  Medication Sig Dispense Refill   abemaciclib (VERZENIO) 50 MG tablet Take 1 tablet (50 mg total) by mouth 2 (two) times daily. 60 tablet 3   albuterol (VENTOLIN HFA) 108 (90 Base) MCG/ACT inhaler Inhale 2 puffs by mouth into the lungs every 6 hours as needed for wheezing or shortness of breath. 18 g 2   amLODipine (NORVASC) 10 MG tablet Take 1 tablet  by mouth daily. 90 tablet 1   anastrozole (ARIMIDEX) 1 MG tablet Take 1 tablet (1 mg total) by mouth daily. 90 tablet 3   ARIPiprazole (ABILIFY) 15 MG tablet Take 1 tablet by mouth at bedtime 30 tablet 0   escitalopram (LEXAPRO) 20 MG tablet Take 1 tablet (20 mg total) by mouth daily. 90 tablet 1   hydrochlorothiazide (HYDRODIURIL) 25 MG tablet Take 1 tablet by mouth daily. 30 tablet 0   montelukast (SINGULAIR) 10 MG tablet Take 1 tablet (10 mg total) by mouth at bedtime. 90 tablet 3   ondansetron (ZOFRAN-ODT) 8 MG disintegrating tablet Dissolve 1 tablet (8 mg total) by mouth every 8 (eight) hours as needed for nausea or vomiting. 20 tablet 0   oxyCODONE-acetaminophen (PERCOCET) 7.5-325 MG tablet Take 1 tablet by mouth every 8 (eight) hours as needed for severe pain. 102 tablet 0   pantoprazole (PROTONIX) 40 MG tablet Take 1 tablet (40 mg total) by mouth daily. 30 tablet 0   prochlorperazine (COMPAZINE) 10 MG tablet  Take 1 tablet (10 mg total) by mouth every 6 (six) hours as needed for nausea or vomiting. 30 tablet 0   promethazine (PHENERGAN) 25 MG suppository Place 1 suppository (25 mg total) rectally every 6 (six) hours as needed for up to 3 days for nausea or vomiting. 12 suppository 0   propranolol (INDERAL) 10 MG tablet Take 1 tablet (10 mg total) by mouth 2 (two) times daily. 180 tablet 3   valsartan (DIOVAN) 40 MG tablet Take 1 tablet (40 mg total) by mouth daily. 90 tablet 3   No current facility-administered medications for this encounter.    Physical Findings: The patient is in  no acute distress. Patient is alert and oriented.  vitals were not taken for this visit. .  No significant changes. Lungs are clear to auscultation bilaterally. Heart has regular rate and rhythm. No palpable cervical, supraclavicular, or axillary adenopathy. Abdomen soft, non-tender, normal bowel sounds.   Lab Findings: Lab Results  Component Value Date   WBC 5.0 05/26/2022   HGB 11.5 (L) 05/26/2022   HCT 36.3 05/26/2022   MCV 75.8 (L) 05/26/2022   PLT 307 05/26/2022    Radiographic Findings: NM PET Image Initial (PI) Skull Base To Thigh  Result Date: 06/02/2022 CLINICAL DATA:  Subsequent treatment strategy for breast cancer. EXAM: NUCLEAR MEDICINE PET SKULL BASE TO THIGH TECHNIQUE: 11.89 mCi F-18 FDG was injected intravenously. Full-ring PET imaging was performed from the skull base to thigh after the radiotracer. CT data was obtained and used for attenuation correction and anatomic localization. Fasting blood glucose: 108 mg/dl COMPARISON:  Multiple priors including most recent CT chest May 11, 2022 FINDINGS: Mediastinal blood pool activity: SUV max 1.1 Liver activity: SUV max NA NECK: No hypermetabolic cervical adenopathy. Incidental CT findings: None. CHEST: No hypermetabolic thoracic adenopathy. No hypermetabolic pulmonary nodules or masses. Postsurgical change of prior right breast lumpectomy with a non hypermetabolic 2.2 x 1.5 cm soft tissue density in the surgical bed with overlying skin thickening on image 105/2, compatible with postsurgical change. Postsurgical change in the left breast with non hypermetabolic band of soft tissue density measuring 2.6 x 0.9 cm on image 99/2 compatible with postsurgical change. Incidental CT findings: Aortic atherosclerosis. Mild cardiac enlargement. Small hiatal hernia. ABDOMEN/PELVIS: No abnormal hypermetabolic activity within the liver, pancreas, adrenal glands, or spleen. No hypermetabolic lymph nodes in the abdomen or pelvis. Incidental CT  findings: Colonic diverticulosis without findings of acute diverticulitis. SKELETON: No abnormal FDG avidity in the multifocal sclerotic and mixed lytic and sclerotic lesions involving the spine, ribs sacrum pelvic bones and bilateral proximal femurs. Incidental CT findings: None. IMPRESSION: 1. Postsurgical change of bilateral breast lumpectomy without evidence of hypermetabolic local recurrence. 2. No abnormal FDG avidity associated with the multifocal sclerotic and mixed lytic/sclerotic osseous lesions throughout the axial and proximal appendicular skeleton. 3. No evidence of hypermetabolic soft tissue metastatic disease. 4. Colonic diverticulosis without findings of acute diverticulitis. 5.  Aortic Atherosclerosis (ICD10-I70.0). Electronically Signed   By: Dahlia Bailiff M.D.   On: 06/02/2022 10:46   CT Chest W Contrast  Result Date: 05/13/2022 CLINICAL DATA:  A 51 year old female with history of breast cancer presents for follow-up. * Tracking Code: BO * EXAM: CT CHEST WITH CONTRAST TECHNIQUE: Multidetector CT imaging of the chest was performed during intravenous contrast administration. RADIATION DOSE REDUCTION: This exam was performed according to the departmental dose-optimization program which includes automated exposure control, adjustment of the mA and/or kV according to patient size and/or use of iterative reconstruction  technique. CONTRAST:  49m OMNIPAQUE IOHEXOL 300 MG/ML  SOLN COMPARISON:  July of 2018. FINDINGS: Cardiovascular: Mild cardiac enlargement. No aortic dilation. Central pulmonary vasculature is unremarkable on venous phase. Collateral pathways about the chest suggest some subclavian venous narrowing on the LEFT. This could also be related to arm position. Mediastinum/Nodes: No thoracic inlet, axillary, mediastinal or hilar adenopathy. Esophagus grossly normal. Lungs/Pleura: No effusion. No consolidative changes. Airways are patent. Basilar atelectasis. Upper Abdomen: Incidental  imaging of upper abdominal contents without acute process. Imaged portions the liver, pancreas, spleen, adrenal glands and kidneys are unremarkable. Musculoskeletal: Sclerotic lesions in the spine not present on more remote imaging. Areas of sclerosis at multiple levels. Vertebral body involvement and posterior element involvement at T9 for example with area of greatest involvement at this vertebral level. Sclerosis in the vertebral body measuring 2.7 x 1.4 cm. Posterior element sclerosis at T1. Involvement at T4, T5, T6, T7, T8, T9 as above, T10, T11 and T12. Sclerotic rib lesions also present in the bilateral chest most notably at the LEFT 6 posterior rib. No frankly destructive bone findings or acute bone process. Signs of RIGHT breast lumpectomy. Surgical clips in the LEFT breast as well, lateral to inferior surgical clips in the LEFT breast is an area of added density measuring 16 x 17 mm that is more focal than other areas. IMPRESSION: 1. Multifocal sclerotic bony metastatic disease greatest in the thoracic spine with scattered rib lesions and sclerosis as well. 2. No nodal or visceral metastasis about the chest. 3. Collateral pathways about the chest suggest some subclavian venous narrowing on the LEFT. This could also be related to arm position. Correlate clinically. 4. Signs of RIGHT breast lumpectomy. Potential postoperative changes in the LEFT breast with small focal area of nodularity in the lateral LEFT breast which could also be postoperative, correlate clinically and consider mammographic correlation as warranted. 5. Mild cardiac enlargement. Electronically Signed   By: GZetta BillsM.D.   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  ***  Plan:  ***   *** minutes of total time was spent for this patient encounter, including preparation, face-to-face counseling with the patient and coordination of care, physical exam, and documentation of the encounter. ____________________________________  JBlair Promise PhD, MD  This document serves as a record of services personally performed by JGery Pray MD. It was created on his behalf by ERoney Mans a trained medical scribe. The creation of this record is based on the scribe's personal observations and the provider's statements to them. This document has been checked and approved by the attending provider.

## 2022-06-06 ENCOUNTER — Ambulatory Visit (HOSPITAL_COMMUNITY)
Admission: RE | Admit: 2022-06-06 | Discharge: 2022-06-06 | Disposition: A | Discharge status: Home / Self Care | Payer: Medicaid Other | Source: Ambulatory Visit | Attending: Radiation Oncology | Admitting: Radiation Oncology

## 2022-06-06 DIAGNOSIS — C7951 Secondary malignant neoplasm of bone: Secondary | ICD-10-CM | POA: Diagnosis present

## 2022-06-06 MED ORDER — GADOBUTROL 1 MMOL/ML IV SOLN
10.0000 mL | Freq: Once | INTRAVENOUS | Status: AC | PRN
  Administered 2022-06-06: 10 mL via INTRAVENOUS

## 2022-06-07 ENCOUNTER — Ambulatory Visit
Admission: RE | Admit: 2022-06-07 | Discharge: 2022-06-07 | Disposition: A | Discharge status: Home / Self Care | Payer: Medicaid Other | Source: Ambulatory Visit | Attending: Radiation Oncology | Admitting: Radiation Oncology

## 2022-06-07 VITALS — BP 168/90 | HR 66 | Temp 97.9°F | Resp 20 | Ht 71.0 in | Wt 220.2 lb

## 2022-06-07 DIAGNOSIS — C50411 Malignant neoplasm of upper-outer quadrant of right female breast: Secondary | ICD-10-CM | POA: Insufficient documentation

## 2022-06-07 DIAGNOSIS — K449 Diaphragmatic hernia without obstruction or gangrene: Secondary | ICD-10-CM | POA: Diagnosis not present

## 2022-06-07 DIAGNOSIS — Z923 Personal history of irradiation: Secondary | ICD-10-CM | POA: Diagnosis not present

## 2022-06-07 DIAGNOSIS — Z170 Estrogen receptor positive status [ER+]: Secondary | ICD-10-CM | POA: Insufficient documentation

## 2022-06-07 DIAGNOSIS — C50412 Malignant neoplasm of upper-outer quadrant of left female breast: Secondary | ICD-10-CM | POA: Diagnosis not present

## 2022-06-07 DIAGNOSIS — Z79899 Other long term (current) drug therapy: Secondary | ICD-10-CM | POA: Diagnosis not present

## 2022-06-07 DIAGNOSIS — C7951 Secondary malignant neoplasm of bone: Secondary | ICD-10-CM | POA: Diagnosis not present

## 2022-06-07 DIAGNOSIS — I517 Cardiomegaly: Secondary | ICD-10-CM | POA: Insufficient documentation

## 2022-06-07 DIAGNOSIS — Z79811 Long term (current) use of aromatase inhibitors: Secondary | ICD-10-CM | POA: Diagnosis not present

## 2022-06-07 NOTE — Progress Notes (Signed)
Chelsea Hansen is here today for follow up post radiation to the breast.   Breast Side:Bilateral breast   They completed their radiation on:  11/02/21  Does the patient complain of any of the following: Post radiation skin issues: No Breast Tenderness: No Breast Swelling: No Lymphadema: NO Range of Motion limitations: Patient going to PT twice a wk Fatigue post radiation: Very Fatigued Appetite good/fair/poor: Fair Pain- Generalized pain  Additional comments if applicable:

## 2022-06-08 ENCOUNTER — Inpatient Hospital Stay: Payer: Medicaid Other

## 2022-06-08 ENCOUNTER — Other Ambulatory Visit (HOSPITAL_COMMUNITY): Payer: Self-pay

## 2022-06-08 ENCOUNTER — Inpatient Hospital Stay (HOSPITAL_BASED_OUTPATIENT_CLINIC_OR_DEPARTMENT_OTHER): Payer: Medicaid Other | Admitting: Nurse Practitioner

## 2022-06-08 ENCOUNTER — Ambulatory Visit: Payer: Medicaid Other

## 2022-06-08 ENCOUNTER — Inpatient Hospital Stay: Payer: Medicaid Other | Admitting: Pharmacist

## 2022-06-08 ENCOUNTER — Encounter: Payer: Self-pay | Admitting: Nurse Practitioner

## 2022-06-08 VITALS — BP 146/84 | HR 74 | Temp 98.1°F | Resp 18 | Ht 71.0 in | Wt 219.9 lb

## 2022-06-08 DIAGNOSIS — C7951 Secondary malignant neoplasm of bone: Secondary | ICD-10-CM | POA: Diagnosis not present

## 2022-06-08 DIAGNOSIS — R209 Unspecified disturbances of skin sensation: Secondary | ICD-10-CM

## 2022-06-08 DIAGNOSIS — C50411 Malignant neoplasm of upper-outer quadrant of right female breast: Secondary | ICD-10-CM

## 2022-06-08 DIAGNOSIS — Z515 Encounter for palliative care: Secondary | ICD-10-CM | POA: Diagnosis not present

## 2022-06-08 DIAGNOSIS — C50412 Malignant neoplasm of upper-outer quadrant of left female breast: Secondary | ICD-10-CM

## 2022-06-08 DIAGNOSIS — L599 Disorder of the skin and subcutaneous tissue related to radiation, unspecified: Secondary | ICD-10-CM

## 2022-06-08 DIAGNOSIS — G893 Neoplasm related pain (acute) (chronic): Secondary | ICD-10-CM | POA: Diagnosis not present

## 2022-06-08 DIAGNOSIS — R293 Abnormal posture: Secondary | ICD-10-CM

## 2022-06-08 DIAGNOSIS — R530 Neoplastic (malignant) related fatigue: Secondary | ICD-10-CM

## 2022-06-08 DIAGNOSIS — Z170 Estrogen receptor positive status [ER+]: Secondary | ICD-10-CM

## 2022-06-08 DIAGNOSIS — I890 Lymphedema, not elsewhere classified: Secondary | ICD-10-CM

## 2022-06-08 DIAGNOSIS — Z95828 Presence of other vascular implants and grafts: Secondary | ICD-10-CM

## 2022-06-08 LAB — CMP (CANCER CENTER ONLY)
ALT: 20 U/L (ref 0–44)
AST: 19 U/L (ref 15–41)
Albumin: 4.5 g/dL (ref 3.5–5.0)
Alkaline Phosphatase: 109 U/L (ref 38–126)
Anion gap: 10 (ref 5–15)
BUN: 15 mg/dL (ref 6–20)
CO2: 26 mmol/L (ref 22–32)
Calcium: 9.5 mg/dL (ref 8.9–10.3)
Chloride: 105 mmol/L (ref 98–111)
Creatinine: 1.06 mg/dL — ABNORMAL HIGH (ref 0.44–1.00)
GFR, Estimated: >60 mL/min (ref >60)
Glucose, Bld: 93 mg/dL (ref 70–99)
Potassium: 3.4 mmol/L — ABNORMAL LOW (ref 3.5–5.1)
Sodium: 141 mmol/L (ref 135–145)
Total Bilirubin: 0.6 mg/dL (ref 0.3–1.2)
Total Protein: 8 g/dL (ref 6.5–8.1)

## 2022-06-08 LAB — CBC WITH DIFFERENTIAL (CANCER CENTER ONLY)
Abs Immature Granulocytes: 0.01 10*3/uL (ref 0.00–0.07)
Basophils Absolute: 0.1 10*3/uL (ref 0.0–0.1)
Basophils Relative: 1 %
Eosinophils Absolute: 0.1 10*3/uL (ref 0.0–0.5)
Eosinophils Relative: 3 %
HCT: 34.2 % — ABNORMAL LOW (ref 36.0–46.0)
Hemoglobin: 11.2 g/dL — ABNORMAL LOW (ref 12.0–15.0)
Immature Granulocytes: 0 %
Lymphocytes Relative: 23 %
Lymphs Abs: 1.2 10*3/uL (ref 0.7–4.0)
MCH: 24.3 pg — ABNORMAL LOW (ref 26.0–34.0)
MCHC: 32.7 g/dL (ref 30.0–36.0)
MCV: 74.3 fL — ABNORMAL LOW (ref 80.0–100.0)
Monocytes Absolute: 0.3 10*3/uL (ref 0.1–1.0)
Monocytes Relative: 6 %
Neutro Abs: 3.3 10*3/uL (ref 1.7–7.7)
Neutrophils Relative %: 67 %
Platelet Count: 246 10*3/uL (ref 150–400)
RBC: 4.6 MIL/uL (ref 3.87–5.11)
RDW: 15.5 % (ref 11.5–15.5)
WBC Count: 4.9 10*3/uL (ref 4.0–10.5)
nRBC: 0 % (ref 0.0–0.2)

## 2022-06-08 MED ORDER — OXYCODONE ER 9 MG PO C12A
9.0000 mg | EXTENDED_RELEASE_CAPSULE | Freq: Two times a day (BID) | ORAL | 0 refills | Status: DC
  Filled 2022-06-08: qty 30, 15d supply, fill #0

## 2022-06-08 MED ORDER — DENOSUMAB 120 MG/1.7ML ~~LOC~~ SOLN
120.0000 mg | Freq: Once | SUBCUTANEOUS | Status: AC
  Administered 2022-06-08: 120 mg via SUBCUTANEOUS
  Filled 2022-06-08: qty 1.7

## 2022-06-08 MED ORDER — OXYCODONE-ACETAMINOPHEN 7.5-325 MG PO TABS
1.0000 | ORAL_TABLET | Freq: Four times a day (QID) | ORAL | 0 refills | Status: DC | PRN
  Filled 2022-06-08 (×2): qty 120, 15d supply, fill #0

## 2022-06-08 NOTE — Progress Notes (Addendum)
[place]  [phone] [phone]) [phone]   Name: ROSLIN Hansen Date: 06/08/2022 MRN: [id number]  DOB: 22-Mar-1971  Patient Care Team: [name], NP as PCP - General (Internal Medicine) [name], DO as PCP - Cardiology (Cardiology) [name], MD as Consulting Physician (Radiation Oncology) [name], MD as Consulting Physician (General Surgery) [name], MD as Consulting Physician (Hematology and Oncology)    REASON FOR CONSULTATION: Chelsea Hansen is a 51 y.o. 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  MRI of L-spine (12/23) showed L3 pathologic fracture and significant lesion along S1 with sclerotic changes.  Palliative ask to see for symptom management and goals of care.    SOCIAL HISTORY:     reports that she has never smoked. She has never used smokeless tobacco. She reports that she does not currently use alcohol after a past usage of about 7.0 standard drinks of alcohol per week. She reports that she does not use drugs.  ADVANCE DIRECTIVES:    CODE STATUS:   PAST MEDICAL HISTORY: Past Medical History:  Diagnosis Date   Allergy    seasonal allergies   Anxiety    on meds   Asthma    uses inhaler   Breast cancer ([place]) 2012   RIGHT lumpectomy   Cancer ([place]) 2022   RIGHT breast lump-dx 2022   Depression    on meds   DVT (deep venous thrombosis) ([place]) 2010   after hysterectomy   Family history of uterine cancer    GERD (gastroesophageal reflux disease)    with certain foods/OTC PRN meds   Headache(784.0)    History of radiation therapy    Bilateral breast-  09/03/21-11/02/21- Dr. [name]   Hyperlipidemia    on meds   Hypertension    on meds   SVT (supraventricular tachycardia)     PAST SURGICAL HISTORY:  Past Surgical History:  Procedure Laterality Date   ABDOMINAL HYSTERECTOMY     AXILLARY SENTINEL NODE BIOPSY Left 07/09/2021   Procedure: LEFT AXILLARY SENTINEL NODE BIOPSY;  Surgeon: [name], MD;  Location: [facility];  Service: General;  Laterality: Left;   BREAST EXCISIONAL BIOPSY Right 09/16/2009   BREAST LUMPECTOMY WITH RADIOACTIVE SEED LOCALIZATION Bilateral 07/09/2021   Procedure: BILATERAL BREAST LUMPECTOMY WITH RADIOACTIVE SEED LOCALIZATION;  Surgeon: [name], MD;  Location: [name];  Service: General;  Laterality: Bilateral;   BREAST SURGERY     lumpectomy   PORT-A-CATH REMOVAL Left 07/09/2021   Procedure: REMOVAL PORT-A-CATH;  Surgeon: [name], MD;  Location: [facility];  Service: General;  Laterality: Left;   PORTACATH PLACEMENT Left 01/26/2021   Procedure: INSERTION PORT-A-CATH;  Surgeon: [name], MD;  Location: [facility];  Service: General;  Laterality: Left;   RADIOACTIVE SEED GUIDED AXILLARY SENTINEL LYMPH NODE Right 07/09/2021   Procedure: RADIOACTIVE SEED GUIDED RIGHT AXILLARY SENTINEL LYMPH NODE DISSECTION;  Surgeon: [name], MD;  Location: [facility];  Service: General;  Laterality: Right;   TUBAL LIGATION      HEMATOLOGY/ONCOLOGY HISTORY:  Oncology History  Malignant neoplasm of upper-outer quadrant of right breast in female, estrogen receptor positive ([place])  12/12/2020 Initial Diagnosis   status post bilateral breast  biopsies 12/03/2020, showing             (1) on the right, a clinical T2 N1, stage IIa invasive lobular carcinoma, grade 2, estrogen and progesterone receptor strongly positive, HER2 not amplified, with an MIB-1 of 40%                         (a) the biopsied right axillary lymph node was  positive with extracapsular extension                         (b) a second right breast mass also biopsied was a fibroadenoma, concordant  MAMMAPRINT tested on biopsy returned high risk, luminal type B, indicating significant benefit from chemo                         (c) biopsy of an area of non-mass-like enhancement in the upper right breast pending   12/17/2020 Cancer Staging   Staging form: Breast, AJCC 8th Edition - Clinical stage from 12/17/2020: Stage IIA (cT2, cN1, cM0, G2, ER+, PR+, HER2-) - Signed by [name], NP on 01/14/2022 Stage prefix: Initial diagnosis Histologic grading system: 3 grade system Laterality: Right Staged by: Pathologist and managing physician Stage used in treatment planning: Yes National guidelines used in treatment planning: Yes Type of national guideline used in treatment planning: NCCN   12/24/2020 Genetic Testing   Negative genetic testing:  No pathogenic variants detected on the [facility] BRCAplus panel (report date 12/24/2020) or the CancerNext-Expanded + RNAinsight panel (report date 12/31/2020). 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   The BRCAplus panel offered by [employer] and includes sequencing and deletion/duplication analysis for the following 8 genes: ATM, BRCA1, BRCA2, CDH1, CHEK2, PALB2, PTEN, and TP53. The CancerNext-Expanded + RNAinsight gene panel offered by [employer] and includes sequencing and rearrangement analysis for the following 77 genes: AIP, ALK, APC, ATM, AXIN2, BAP1, BARD1, BLM, BMPR1A, BRCA1, BRCA2, BRIP1, CDC73, CDH1, CDK4, CDKN1B, CDKN2A, CHEK2, CTNNA1, DICER1, FANCC, FH, FLCN, GALNT12, KIF1B, LZTR1, MAX, MEN1, MET, MLH1, MSH2, MSH3, MSH6, MUTYH, NBN, NF1, NF2, NTHL1, PALB2, PHOX2B, PMS2, POT1, PRKAR1A, PTCH1, PTEN, RAD51C, RAD51D, RB1, RECQL, RET, SDHA, SDHAF2, SDHB, SDHC, SDHD, SMAD4, SMARCA4, SMARCB1, SMARCE1, STK11, SUFU, TMEM127, TP53, TSC1, TSC2, VHL and  XRCC2 (sequencing and deletion/duplication); EGFR, EGLN1, HOXB13, KIT, MITF, PDGFRA, POLD1 and POLE (sequencing only); EPCAM and GREM1 (deletion/duplication only). RNA data is routinely analyzed for use in variant interpretation for all genes.   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, margins negative, 0/1 lymph node negative ER 95%, PR 100%   07/09/2021 Cancer Staging   Staging form: Breast, AJCC 8th Edition - Pathologic stage from 07/09/2021: No Stage Recommended (ypT2, pN1a, cM0, G2) - Signed by [name], NP on 07/22/2021 Stage prefix: Post-therapy Histologic grading system: 3 grade system   07/2021 -  Radiation Therapy   Adjuvant radiation to follow surgery   11/2021 -  Anti-estrogen oral therapy   Letrozole x 5-7 years; changed  to anastrozole   05/13/2022 Imaging   CT chest  IMPRESSION: 1. Multifocal sclerotic bony metastatic disease greatest in the thoracic spine with scattered rib lesions and sclerosis as well. 2. No nodal or visceral metastasis about the chest. 3. Collateral pathways about the chest suggest some subclavian venous narrowing on the LEFT. This could also be related to arm position. Correlate clinically. 4. Signs of RIGHT breast lumpectomy. Potential postoperative changes in the LEFT breast with small focal area of nodularity in the lateral LEFT breast which could also be postoperative, correlate clinically and consider mammographic correlation as warranted. 5. Mild cardiac enlargement.   05/18/2022 Treatment Plan Change   Anastrozole  + Verzenio 120m PO BID   Malignant neoplasm of upper-outer quadrant of left breast in female, [place] receptor positive [place]  12/12/2020 Initial Diagnosis   on the left, a clinical T1b N0, stage IA invasive ductal carcinoma, grade 1 or 2, estrogen and progesterone receptor positive, HER2 not amplified, with an MIB 1 of 15%   12/17/2020 Cancer Staging   Staging form: Breast, AJCC 8th Edition - Clinical stage from 12/17/2020: Stage IA (cT1b, cN0, cM0, G2, ER+, PR+, HER2-) - Signed by CGardenia Phlegm NP on 01/14/2022 Stage prefix: Initial diagnosis Histologic grading system: 3 grade system Laterality: Left Staged by: Pathologist and managing physician Stage used in treatment planning: Yes National guidelines used in treatment planning: Yes Type of national guideline used in treatment planning: NCCN   12/24/2020 Genetic Testing   Negative genetic testing:  No pathogenic variants detected on the [facility] BRCAplus panel (report date 12/24/2020) or the CancerNext-Expanded + RNAinsight panel (report date 12/31/2020). 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   The BRCAplus panel offered by [employer] includes sequencing and deletion/duplication analysis for the following 8 genes: ATM, BRCA1, BRCA2, CDH1, CHEK2, PALB2, PTEN, and TP53. The CancerNext-Expanded + RNAinsight gene panel offered by [employer] includes sequencing and rearrangement analysis for the following 77 genes: AIP, ALK, APC, ATM, AXIN2, BAP1, BARD1, BLM, BMPR1A, BRCA1, BRCA2, BRIP1, CDC73, CDH1, CDK4, CDKN1B, CDKN2A, CHEK2, CTNNA1, DICER1, FANCC, FH, FLCN, GALNT12, KIF1B, LZTR1, MAX, MEN1, MET, MLH1, MSH2, MSH3, MSH6, MUTYH, NBN, NF1, NF2, NTHL1, PALB2, PHOX2B, PMS2, POT1, PRKAR1A, PTCH1, PTEN, RAD51C, RAD51D, RB1, RECQL, RET, SDHA, SDHAF2, SDHB, SDHC, SDHD, SMAD4, SMARCA4, SMARCB1, SMARCE1, STK11, SUFU, TMEM127, TP53, TSC1, TSC2, VHL and XRCC2 (sequencing and  deletion/duplication); EGFR, EGLN1, HOXB13, KIT, MITF, PDGFRA, POLD1 and POLE (sequencing only); EPCAM and GREM1 (deletion/duplication only). RNA data is routinely analyzed for use in variant interpretation for all genes.   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, margins negative, 0/1 lymph node negative ER 95%, PR 100%   07/09/2021 Cancer Staging   Staging form: Breast, AJCC 8th Edition - Pathologic stage from 07/09/2021: No Stage Recommended (ypTis (DCIS), pN0, cM0) - Signed by CGardenia Phlegm NP on 07/22/2021 Stage prefix: Post-therapy   07/2021 -  Radiation Therapy   Adjuvant radiation to follow surgery   11/2021 -  Anti-estrogen oral therapy   Letrozole x 5-7 years; changed to anastrozole   05/13/2022  Imaging   CT chest  IMPRESSION: 1. Multifocal sclerotic bony metastatic disease greatest in the thoracic spine with scattered rib lesions and sclerosis as well. 2. No nodal or visceral metastasis about the chest. 3. Collateral pathways about the chest suggest some subclavian venous narrowing on the LEFT. This could also be related to arm position. Correlate clinically. 4. Signs of RIGHT breast lumpectomy. Potential postoperative changes in the LEFT breast with small focal area of nodularity in the lateral LEFT breast which could also be postoperative, correlate clinically and consider mammographic correlation as warranted. 5. Mild cardiac enlargement.   05/18/2022 Treatment Plan Change   Anastrozole + Verzenio 132m PO BID     ALLERGIES:  has No Known  Allergies.  MEDICATIONS:  Current Outpatient Medications  Medication Sig Dispense Refill   abemaciclib (VERZENIO) 50 MG tablet Take 1 tablet (50 mg total) by mouth 2 (two) times daily. 60 tablet 3   albuterol (VENTOLIN HFA) 108 (90 Base) MCG/ACT inhaler Inhale 2 puffs by mouth into the lungs every 6 hours as needed for wheezing or shortness of breath. 18 g 2   amLODipine (NORVASC) 10 MG tablet Take 1 tablet  by mouth daily. 90 tablet 1   anastrozole (ARIMIDEX) 1 MG tablet Take 1 tablet (1 mg total) by mouth daily. 90 tablet 3   ARIPiprazole (ABILIFY) 15 MG tablet Take 1 tablet by mouth at bedtime 30 tablet 0   escitalopram (LEXAPRO) 20 MG tablet Take 1 tablet (20 mg total) by mouth daily. 90 tablet 1   hydrochlorothiazide (HYDRODIURIL) 25 MG tablet Take 1 tablet by mouth daily. 30 tablet 0   montelukast (SINGULAIR) 10 MG tablet Take 1 tablet (10 mg total) by mouth at bedtime. 90 tablet 3   oxyCODONE-acetaminophen (PERCOCET) 7.5-325 MG tablet Take 1 tablet by mouth every 8 (eight) hours as needed for severe pain. 102 tablet 0   pantoprazole (PROTONIX) 40 MG tablet Take 1 tablet (40 mg total) by mouth daily. 30 tablet 0   prochlorperazine (COMPAZINE) 10 MG tablet Take 1 tablet (10 mg total) by mouth every 6 (six) hours as needed for nausea or vomiting. 30 tablet 0   promethazine (PHENERGAN) 25 MG suppository Place 1 suppository (25 mg total) rectally every 6 (six) hours as needed for up to 3 days for nausea or vomiting. 12 suppository 0   propranolol (INDERAL) 10 MG tablet Take 1 tablet (10 mg total) by mouth 2 (two) times daily. 180 tablet 3   valsartan (DIOVAN) 40 MG tablet Take 1 tablet (40 mg total) by mouth daily. 90 tablet 3   No current facility-administered medications for this visit.   Facility-Administered Medications Ordered in Other Visits  Medication Dose Route Frequency Provider Last Rate Last Admin   denosumab (XGEVA) injection 120 mg  120 mg Subcutaneous Once GNicholas Lose MD         VITAL SIGNS: There were no vitals taken for this visit. There were no vitals filed for this visit.  Estimated body mass index is 30.67 kg/m as calculated from the following:   Height as of an earlier encounter on 06/08/22: _0  (1.803 m).   Weight as of an earlier encounter on 06/08/22: 219 lb 14.4 oz (99.7 kg).  LABS: CBC:    Component Value Date/Time   WBC 4.9 06/08/2022 1041   WBC 3.6 (L) 06/08/2021 0817   HGB 11.2 (L) 06/08/2022 1041   HGB 11.5 10/06/2020 1432   HCT 34.2 (L) 06/08/2022 1041   HCT 36.8 10/06/2020  1432   PLT 246 06/08/2022 1041   PLT 323 10/06/2020 1432   MCV 74.3 (L) 06/08/2022 1041   MCV 73 (L) 10/06/2020 1432   NEUTROABS 3.3 06/08/2022 1041   NEUTROABS 5.0 10/06/2020 1432   LYMPHSABS 1.2 06/08/2022 1041   LYMPHSABS 1.9 10/06/2020 1432   MONOABS 0.3 06/08/2022 1041   EOSABS 0.1 06/08/2022 1041   EOSABS 0.1 10/06/2020 1432   BASOSABS 0.1 06/08/2022 1041   BASOSABS 0.0 10/06/2020 1432   Comprehensive Metabolic Panel:    Component Value Date/Time   NA 141 06/08/2022 1041   NA 139 10/06/2020 1432   K 3.4 (L) 06/08/2022 1041   CL 105 06/08/2022 1041   CO2 26 06/08/2022 1041   BUN 15 06/08/2022 1041   BUN 11 10/06/2020 1432   CREATININE 1.06 (H) 06/08/2022 1041   GLUCOSE 93 06/08/2022 1041   CALCIUM 9.5 06/08/2022 1041   AST 19 06/08/2022 1041   ALT 20 06/08/2022 1041   ALKPHOS 109 06/08/2022 1041   BILITOT 0.6 06/08/2022 1041   PROT 8.0 06/08/2022 1041   PROT 6.9 10/06/2020 1432   ALBUMIN 4.5 06/08/2022 1041   ALBUMIN 4.1 10/06/2020 1432    RADIOGRAPHIC STUDIES: MR Lumbar Spine W Wo Contrast  Result Date: 06/07/2022 CLINICAL DATA:  51 year old female with breast cancer. Skeletal metastatic disease, recently negative on PET-CT EXAM: MRI LUMBAR SPINE WITHOUT AND WITH CONTRAST TECHNIQUE: Multiplanar and multiecho pulse sequences of the lumbar spine were obtained without and with intravenous contrast. CONTRAST:  31m GADAVIST GADOBUTROL  1 MMOL/ML IV SOLN COMPARISON:  PET-CT 06/02/2022. FINDINGS: Segmentation: Normal with hypoplastic ribs at T12 on the comparison. Alignment: Mild straightening of lumbar lordosis. Minor levoconvex scoliosis. No significant spondylolisthesis. Vertebrae: Pathologic L3 vertebral body compression fracture with mild comminution, superior and inferior endplate deformities. Patchy enhancement there following contrast, but little to no associated marrow edema on STIR imaging (series 6, image 8), and not FDG avid 4 days ago. Scattered additional sclerotic appearing spinal metastases with decreased T1 and T2 signal, most pronounced in the S1 body. No significant marrow edema or enhancement. No epidural or extraosseous tumor extension identified. Conus medullaris and cauda equina: Conus extends to the T12-L1 level. No lower spinal cord or conus signal abnormality. No abnormal intradural enhancement. Normal cauda equina nerve roots. No dural thickening. Paraspinal and other soft tissues: Stable to the recent PET-CT Disc levels: Capacious spinal canal. Lumbar disc degeneration primarily L2-L3 and L3-L4 with disc bulging. No spinal stenosis. Mild to moderate lateral recess and/or foraminal stenosis at the right L2, right L3, bilateral L3 nerve levels. IMPRESSION: 1. Skeletal metastatic disease which is likely treated/quiescent - including an L3 pathologic vertebral body fracture with mild comminution and enhancement, but little if any marrow edema and did NOT appear hypermetabolic on recent PET-CT. 2. No lumbar dural or intrathecal metastases. And capacious spinal canal with no spinal stenosis despite some degeneration. Electronically Signed   By: HGenevie AnnM.D.   On: 06/07/2022 08:35   NM PET Image Initial (PI) Skull Base To Thigh  Result Date: 06/02/2022 CLINICAL DATA:  Subsequent treatment strategy for breast cancer. EXAM: NUCLEAR MEDICINE PET SKULL BASE TO THIGH TECHNIQUE: 11.89 mCi F-18 FDG was injected intravenously.  Full-ring PET imaging was performed from the skull base to thigh after the radiotracer. CT data was obtained and used for attenuation correction and anatomic localization. Fasting blood glucose: 108 mg/dl COMPARISON:  Multiple priors including most recent CT chest May 11, 2022 FINDINGS: Mediastinal blood pool activity:  SUV max 1.1 Liver activity: SUV max NA NECK: No hypermetabolic cervical adenopathy. Incidental CT findings: None. CHEST: No hypermetabolic thoracic adenopathy. No hypermetabolic pulmonary nodules or masses. Postsurgical change of prior right breast lumpectomy with a non hypermetabolic 2.2 x 1.5 cm soft tissue density in the surgical bed with overlying skin thickening on image 105/2, compatible with postsurgical change. Postsurgical change in the left breast with non hypermetabolic band of soft tissue density measuring 2.6 x 0.9 cm on image 99/2 compatible with postsurgical change. Incidental CT findings: Aortic atherosclerosis. Mild cardiac enlargement. Small hiatal hernia. ABDOMEN/PELVIS: No abnormal hypermetabolic activity within the liver, pancreas, adrenal glands, or spleen. No hypermetabolic lymph nodes in the abdomen or pelvis. Incidental CT findings: Colonic diverticulosis without findings of acute diverticulitis. SKELETON: No abnormal FDG avidity in the multifocal sclerotic and mixed lytic and sclerotic lesions involving the spine, ribs sacrum pelvic bones and bilateral proximal femurs. Incidental CT findings: None. IMPRESSION: 1. Postsurgical change of bilateral breast lumpectomy without evidence of hypermetabolic local recurrence. 2. No abnormal FDG avidity associated with the multifocal sclerotic and mixed lytic/sclerotic osseous lesions throughout the axial and proximal appendicular skeleton. 3. No evidence of hypermetabolic soft tissue metastatic disease. 4. Colonic diverticulosis without findings of acute diverticulitis. 5.  Aortic Atherosclerosis (ICD10-I70.0). Electronically  Signed   By: Dahlia Bailiff M.D.   On: 06/02/2022 10:46   CT Chest W Contrast  Result Date: 05/13/2022 CLINICAL DATA:  A 51 year old female with history of breast cancer presents for follow-up. * Tracking Code: BO * EXAM: CT CHEST WITH CONTRAST TECHNIQUE: Multidetector CT imaging of the chest was performed during intravenous contrast administration. RADIATION DOSE REDUCTION: This exam was performed according to the departmental dose-optimization program which includes automated exposure control, adjustment of the mA and/or kV according to patient size and/or use of iterative reconstruction technique. CONTRAST:  69m OMNIPAQUE IOHEXOL 300 MG/ML  SOLN COMPARISON:  July of 2018. FINDINGS: Cardiovascular: Mild cardiac enlargement. No aortic dilation. Central pulmonary vasculature is unremarkable on venous phase. Collateral pathways about the chest suggest some subclavian venous narrowing on the LEFT. This could also be related to arm position. Mediastinum/Nodes: No thoracic inlet, axillary, mediastinal or hilar adenopathy. Esophagus grossly normal. Lungs/Pleura: No effusion. No consolidative changes. Airways are patent. Basilar atelectasis. Upper Abdomen: Incidental imaging of upper abdominal contents without acute process. Imaged portions the liver, pancreas, spleen, adrenal glands and kidneys are unremarkable. Musculoskeletal: Sclerotic lesions in the spine not present on more remote imaging. Areas of sclerosis at multiple levels. Vertebral body involvement and posterior element involvement at T9 for example with area of greatest involvement at this vertebral level. Sclerosis in the vertebral body measuring 2.7 x 1.4 cm. Posterior element sclerosis at T1. Involvement at T4, T5, T6, T7, T8, T9 as above, T10, T11 and T12. Sclerotic rib lesions also present in the bilateral chest most notably at the LEFT 6 posterior rib. No frankly destructive bone findings or acute bone process. Signs of RIGHT breast lumpectomy.  Surgical clips in the LEFT breast as well, lateral to inferior surgical clips in the LEFT breast is an area of added density measuring 16 x 17 mm that is more focal than other areas. IMPRESSION: 1. Multifocal sclerotic bony metastatic disease greatest in the thoracic spine with scattered rib lesions and sclerosis as well. 2. No nodal or visceral metastasis about the chest. 3. Collateral pathways about the chest suggest some subclavian venous narrowing on the LEFT. This could also be related to arm position. Correlate clinically. 4. Signs  of RIGHT breast lumpectomy. Potential postoperative changes in the LEFT breast with small focal area of nodularity in the lateral LEFT breast which could also be postoperative, correlate clinically and consider mammographic correlation as warranted. 5. Mild cardiac enlargement. Electronically Signed   By: Zetta Bills M.D.   On: 05/13/2022 11:04    PERFORMANCE STATUS (ECOG) : 1 - Symptomatic but completely ambulatory  Review of Systems  Constitutional:  Positive for fatigue.  Musculoskeletal:  Positive for arthralgias and back pain.  Unless otherwise noted, a complete review of systems is negative.  Physical Exam General: NAD Cardiovascular: regular rate and rhythm Pulmonary: Normal breathing pattern Abdomen: soft, nontender, + bowel sounds Extremities: no edema, no joint deformities Skin: no rashes Neurological: AAOx3, mood appropriate  IMPRESSION: This is my initial visit with Ms. Tegethoff.  No family present.  No acute distress noted.  Patient is ambulatory without assistive devices.  Alert and able to engage appropriately in discussions.  I introduced myself, [place] RN, and Palliative's role in collaboration with the oncology team. Concept of Palliative Care was introduced as specialized medical care for people and their families living with serious illness.  It focuses on providing relief from the symptoms and stress of a serious illness.  The goal is to  improve quality of life for both the patient and the family. Values and goals of care important to patient and family were attempted to be elicited.   Ms. Sherbert states she lives at home with a roommate.  She has 5 children and 11 grandchildren.  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  Enjoys shopping.  States her family is her support system.  She is independent of all ADLs.  Does have some limitations in her activity due to ongoing pain and discomfort.  We discussed her current illness and what it means in the larger context of Her on-going co-morbidities. Natural disease trajectory and expectations were discussed.  Ionna is realistic in her understanding regarding her metastatic cancer.  She is remaining hopeful for some stability.  Clear and expressed wishes to continue to treat the treatable aggressively allow her every opportunity to continue to thrive.  Wishes to manage her symptoms aggressively to improve her quality of life.  Neoplasm related pain Ms. Cedillo complains of ongoing back and shoulder pain.  States her pain radiates around to her sides depending on activity level.  Pain does make it difficult for her to perform certain activities and lifting.  She rates her pain 10/10.  She describes her pain as a constant/nagging throb/ache.  Sometimes sharp.  Nothing provides her with relief.  Her pain will decrease to 5-6/10 with medication.  She is trying to continue to work as this is her only means of finances.  She was initially denied for disability however has recently reapplied due to her new cancer findings.  We discussed at length her current pain regimen.  She is currently taking Percocet 15 mg / 650 mg every 6 hours as needed for pain.  Shares she was initially taking every 8 hours however pain increased around 5-6 hours.  She is having to take medication around-the-clock.  Education provided on goals of improving her pain.  Realistically with a pain goal of 4.  I discussed  at length addition of long-acting pain medication including potential side effects.  Patient verbalized understanding.  We will begin her on Xtampza 9 mg every 12 hours in addition to her current dose of Percocet as needed for breakthrough  pain.  We will continue to closely monitor and adjust as needed.  Given patient's pain is related directly to bony mets may consider use of anti-inflammatory such as Celebrex/dexamethasone in the near future as needed.  Constipation Kaila denies any concerns with constipation.  Currently is having daily bowel movements.  Education provided on use of bowel regimen (MiraLAX) in the setting of opioid use.  I discussed the importance of continued conversation with family and their medical providers regarding overall plan of care and treatment options, ensuring decisions are within the context of the patients values and GOCs.  PLAN: Established therapeutic relationship. Education provided on palliative's role in collaboration with their Oncology/Radiation team. Percocet 7.5/325-1-2 tablets every 6 hours as needed for breakthrough pain Xtampza 9 mg every 12 hours.  Patient to start tomorrow 12/20 as she has to work night shift tonight.  Education provided on goal is for her to be at home so she can monitor her response to medication.  She verbalized understanding. MiraLAX daily for bowel support. I will plan to see patient back in 2-4 weeks in collaboration to other oncology appointments.  Will plan to follow-up with patient in 5-7 days for close symptom management support.   Patient expressed understanding and was in agreement with this plan. She also understands that She can call the clinic at any time with any questions, concerns, or complaints.   Thank you for your referral and allowing Palliative to assist in Ms. [place] care.   Number and complexity of problems addressed: HIGH - 1 or more chronic illnesses with SEVERE exacerbation, progression, or side  effects of treatment - advanced cancer, pain. Any controlled substances utilized were prescribed in the context of palliative care.  Time Total: 50 min   Visit consisted of counseling and education dealing with the complex and emotionally intense issues of symptom management and palliative care in the setting of serious and potentially life-threatening illness.Greater than 50%  of this time was spent counseling and coordinating care related to the above assessment and plan.  Signed by: Alda Lea, AGPCNP-BC Palliative Medicine Team/[facility]

## 2022-06-08 NOTE — Progress Notes (Signed)
[facility]       Telephone: [phone]?Fax: [phone]   Oncology Clinical Pharmacist Practitioner Initial Assessment  Chelsea Hansen is a 51 y.o. female with a diagnosis of metastatic breast cancer. They were contacted today via in-person visit.  Indication/Regimen Abemaciclib (Verzenio) is being used appropriately for treatment of metastatic breast cancer by Dr. [name].      Wt Readings from Last 1 Encounters:  06/08/22 219 lb 14.4 oz (99.7 kg)    Estimated body surface area is 2.23 meters squared as calculated from the following:   Height as of this encounter: '5\' 11"'$  (1.803 m).   Weight as of this encounter: 219 lb 14.4 oz (99.7 kg).  The dosing regimen is 50 mg by mouth every 12 hours on days 1 to 28 of a 28-day cycle. Chelsea Hansen was initially started on 100 mg every 12 hours on 05/21/22 but could not tolerate this dose. She started on the 50 mg every 12 dose on 05/31/22 per her report. This is being given  in combination with anastrozole started 03/22/22 and every 12 week denosumab 120 mg which will start today. Dr. Lindi Adie is okay not obtaining dental clearance for denosumab . The treatment regimen is planned to continue until disease progression or unacceptable toxicity.  Chelsea Hansen was seen by clinical pharmacy today to establish care after being referred by Dr. Lindi Adie for the management of her abemaciclib.  As above, she was initially started on abemaciclib 100 mg every 12 hours but had a lot of vomiting with this dose and ended up having to get IV fluids.  She feels she is tolerating the 50 mg dose much better.  We have removed the ondansetron ODT prescription that she says she is not taking this.  However, she is using prochlorperazine tablets with effect.  She also has over-the-counter loperamide at home for her loose stools which she says are controlled.  She will also be starting denosumab 120 mg every 12 weeks today.  We did give her the dental clearance  form but at this time she states she does not have a dentist.  As above, we got approval from Dr. Lindi Adie to start denosumab 120 mg without dental clearance.  She will also be having a simulation with radiation on 06/15/22 and they will be treating her for bone metastases.  Today we discussed possible side effects of abemaciclib which include are not limited to diarrhea, neutropenia, pneumonitis, liver toxicity, blood clots, fatigue, nausea, vomiting, taste disturbances, and hair loss.  We also discussed that she should avoid grapefruit products and how to properly store and handle medication.  She takes her medication at 11:15 AM and 11:15 PM and we discussed what she should do should she miss a dose.  We also gave her the 24/7 triage line in case she has any side effects or questions between visits.  We will add every 2-week visits per manufacturing guidelines for the first 2 months with labs and then monthly labs for 2 months.  She will alternate seeing Dr. Lindi Adie and clinical pharmacy during this time.    Dose Modifications Chelsea Hansen was started on 100 mg every 12 hours but this dose was not tolerated.  She is now on abemaciclib 50 mg every 12 hours and remain at this dose.  Access Assessment Chelsea Hansen will be receiving abemaciclib through [facility] Concerns: None Start date if known: 05/21/22  Allergies No Known Allergies  Vitals    06/08/2022   11:01 AM 06/07/2022   11:05 AM 06/03/2022    8:54 AM  Oncology Vitals  Height 180 cm 180 cm 180 cm  Weight 99.746 kg 99.882 kg 99.701 kg  Weight (lbs) 219 lbs 14 oz 220 lbs 3 oz 219 lbs 13 oz  BMI 30.67 kg/m2   30.67 kg/m2 30.71 kg/m2   30.71 kg/m2 30.66 kg/m2   30.66 kg/m2  Temp 98.1 F (36.7 C) 97.9 F (36.6 C) 97.3 F (36.3 C)  Pulse Rate 74 66 81  BP 146/84 168/90 148/85  Resp '18 20 18  '$ SpO2 99 % 100 % 100 %  BSA (m2) 2.23 m2   2.23 m2 2.24 m2   2.24 m2 2.23 m2   2.23 m2     Laboratory  Data    Latest Ref Rng & Units 06/08/2022   10:41 AM 05/26/2022    2:05 PM 05/24/2022   12:06 PM  CBC EXTENDED  WBC 4.0 - 10.5 K/uL 4.9  5.0  6.1   RBC 3.87 - 5.11 MIL/uL 4.60  4.79  4.99   Hemoglobin 12.0 - 15.0 g/dL 11.2  11.5  11.9   HCT 36.0 - 46.0 % 34.2  36.3  37.7   Platelets 150 - 400 K/uL 246  307  310   NEUT# 1.7 - 7.7 K/uL 3.3  3.3  4.1   Lymph# 0.7 - 4.0 K/uL 1.2  1.2  1.4        Latest Ref Rng & Units 06/08/2022   10:41 AM 05/26/2022    2:05 PM 05/24/2022   12:06 PM  CMP  Glucose 70 - 99 mg/dL 93  65  100   BUN 6 - 20 mg/dL '15  16  16   '$ Creatinine 0.44 - 1.00 mg/dL 1.06  1.08  1.16   Sodium 135 - 145 mmol/L 141  142  140   Potassium 3.5 - 5.1 mmol/L 3.4  4.1  4.1   Chloride 98 - 111 mmol/L 105  106  102   CO2 22 - 32 mmol/L 26  28  32   Calcium 8.9 - 10.3 mg/dL 9.5  9.2  10.1   Total Protein 6.5 - 8.1 g/dL 8.0  7.5  7.8   Total Bilirubin 0.3 - 1.2 mg/dL 0.6  0.4  0.4   Alkaline Phos 38 - 126 U/L 109  89  95   AST 15 - 41 U/L '19  16  16   '$ ALT 0 - 44 U/L '20  19  16    '$ Lab Results  Component Value Date   MG 2.0 05/24/2022   MG 2.0 01/28/2021   MG 1.9 12/31/2011   Lab Results  Component Value Date   CA2729 66.9 (H) 02/10/2021    Contraindications Contraindications were reviewed?  Yes Contraindications to therapy were identified?  No  Safety Precautions The following safety precautions for the use of abemaciclib were reviewed:  Diarrhea: we reviewed that diarrhea is common with abemaciclib and confirmed that she does have loperamide (Imodium) at home.  We reviewed how to take this medication PRN and gave her information on abemaciclib Neutropenia: we discussed the importance of having a thermometer and what the Centers for Disease Control and Prevention (CDC) considers a fever which is 100.20F (38C) or higher.  Gave patient 24/7 triage line to call if any fevers or symptoms ILD/Pneumonitis: we reviewed potential symptoms including cough, shortness, and  fatigue. Hepatotoxicity: reviewed to contact clinic for RUQ  pain that will not subside, yellowing of eyes/skin Venous thromboembolism (VTE): reviewed signs of deep vein thrombosis (DVT) such as leg swelling, redness, pain, or tenderness and signs of pulmonary embolism (PE) such as shortness of breath, rapid or irregular heartbeat, cough, chest pain, or lightheadedness Reviewed to take the medication every 12 hours (with food sometimes can be easier on the stomach) and to take it at the same time every day. Discussed proper storage and handling of abemaciclib  Medication Reconciliation Current Outpatient Medications  Medication Sig Dispense Refill   abemaciclib (VERZENIO) 50 MG tablet Take 1 tablet (50 mg total) by mouth 2 (two) times daily. 60 tablet 3   albuterol (VENTOLIN HFA) 108 (90 Base) MCG/ACT inhaler Inhale 2 puffs by mouth into the lungs every 6 hours as needed for wheezing or shortness of breath. 18 g 2   amLODipine (NORVASC) 10 MG tablet Take 1 tablet  by mouth daily. 90 tablet 1   anastrozole (ARIMIDEX) 1 MG tablet Take 1 tablet (1 mg total) by mouth daily. 90 tablet 3   ARIPiprazole (ABILIFY) 15 MG tablet Take 1 tablet by mouth at bedtime 30 tablet 0   escitalopram (LEXAPRO) 20 MG tablet Take 1 tablet (20 mg total) by mouth daily. 90 tablet 1   hydrochlorothiazide (HYDRODIURIL) 25 MG tablet Take 1 tablet by mouth daily. 30 tablet 0   montelukast (SINGULAIR) 10 MG tablet Take 1 tablet (10 mg total) by mouth at bedtime. 90 tablet 3   oxyCODONE-acetaminophen (PERCOCET) 7.5-325 MG tablet Take 1 tablet by mouth every 8 (eight) hours as needed for severe pain. 102 tablet 0   pantoprazole (PROTONIX) 40 MG tablet Take 1 tablet (40 mg total) by mouth daily. 30 tablet 0   prochlorperazine (COMPAZINE) 10 MG tablet Take 1 tablet (10 mg total) by mouth every 6 (six) hours as needed for nausea or vomiting. 30 tablet 0   propranolol (INDERAL) 10 MG tablet Take 1 tablet (10 mg total) by mouth 2 (two)  times daily. 180 tablet 3   valsartan (DIOVAN) 40 MG tablet Take 1 tablet (40 mg total) by mouth daily. 90 tablet 3   promethazine (PHENERGAN) 25 MG suppository Place 1 suppository (25 mg total) rectally every 6 (six) hours as needed for up to 3 days for nausea or vomiting. 12 suppository 0   No current facility-administered medications for this visit.   Medication reconciliation is based on the patient's most recent medication list in the electronic medical record (EMR) including herbal products and OTC medications.   The patient's medication list was reviewed today with the patient?  Yes  Drug-drug interactions (DDIs) DDIs were evaluated?  Yes Significant DDIs identified?  No  Drug-Food Interactions Drug-food interactions were evaluated?  Yes Drug-food interactions identified?  Patient knows to avoid grapefruit products  Follow-up Plan  Denosumab 120 mg to start today.  Patient will get dental clearance at a later time.  Dr. Lindi Adie is aware. Continue abemaciclib 50 mg by mouth every 12 hours.  We will not be increasing the dose that she did not tolerate 100 mg every 12 hours. Continue anastrozole 1 mg by mouth daily Continue prochlorperazine by mouth as needed for nausea Continue loperamide by mouth as needed for diarrhea Chelsea Hansen is also being followed by palliative care/pain management and we appreciate their recommendations. She has a simulation with radiation oncology on 06/15/22 We will add labs, pharmacy clinic visit, in 2 weeks Restaging scans will likely be done sometime in March  Chelsea Hansen participated in the discussion, expressed understanding, and voiced agreement with the above plan. All questions were answered to her satisfaction. The patient was advised to contact the clinic at [phone] with any questions or concerns prior to her return visit.   I spent 60 minutes assessing the patient.  Raina Mina, RPH-CPP, 06/08/2022 12:10 PM  **Disclaimer: This  note was dictated with voice recognition software. Similar sounding words can inadvertently be transcribed and this note may contain transcription errors which may not have been corrected upon publication of note.**

## 2022-06-08 NOTE — Patient Instructions (Signed)
-   pick up and take your OxyContin (long acting pain meds), take every 12 hours, like at 9am and 9pm OR 10am and 10pm. Take this medication everyday regardless of pain level - continue to take 1-2 percocet (the oxycodone-acetaminophen) every 6 hours for breakthrough pain - take mirilax everyday if you go more than 24 hours without a bowel movement - with starting a new medication if you experience any hives, new shortness of breath or chest pain seek emergency services - call the office with any questions or concerns [phone]

## 2022-06-08 NOTE — Therapy (Signed)
OUTPATIENT PHYSICAL THERAPY ONCOLOGY TREATMENT  Patient Name: Chelsea Hansen MRN: [id number] DOB:09/26/1970, 51 y.o., female Today's Date: 06/08/2022   PT End of Session - 06/08/22 0805     Visit Number 15    Number of Visits 20    Date for PT Re-Evaluation 06/20/22    Authorization Type Medicaid    Authorization Time Period 5    PT Start Time 0806    PT Stop Time 0904    PT Time Calculation (min) 58 min    Activity Tolerance Patient tolerated treatment well    Behavior During Therapy [facility] for tasks assessed/performed              Past Medical History:  Diagnosis Date   Allergy    seasonal allergies   Anxiety    on meds   Asthma    uses inhaler   Breast cancer ([place]) 2012   RIGHT lumpectomy   Cancer ([place]) 2022   RIGHT breast lump-dx 2022   Depression    on meds   DVT (deep venous thrombosis) ([place]) 2010   after hysterectomy   Family history of uterine cancer    GERD (gastroesophageal reflux disease)    with certain foods/OTC PRN meds   Headache(784.0)    History of radiation therapy    Bilateral breast- 09/03/21-11/02/21- Dr. [name]   Hyperlipidemia    on meds   Hypertension    on meds   SVT (supraventricular tachycardia)    Past Surgical History:  Procedure Laterality Date   ABDOMINAL HYSTERECTOMY     AXILLARY SENTINEL NODE BIOPSY Left 07/09/2021   Procedure: LEFT AXILLARY SENTINEL NODE BIOPSY;  Surgeon: [name], MD;  Location: [place];  Service: General;  Laterality: Left;   BREAST EXCISIONAL BIOPSY Right 09/16/2009   BREAST LUMPECTOMY WITH RADIOACTIVE SEED LOCALIZATION Bilateral 07/09/2021   Procedure: BILATERAL BREAST LUMPECTOMY WITH RADIOACTIVE SEED LOCALIZATION;  Surgeon: [name], MD;  Location: [place];  Service: General;  Laterality: Bilateral;   BREAST SURGERY     lumpectomy   PORT-A-CATH REMOVAL Left 07/09/2021   Procedure: REMOVAL PORT-A-CATH;  Surgeon: [name], MD;   Location: [place];  Service: General;  Laterality: Left;   PORTACATH PLACEMENT Left 01/26/2021   Procedure: INSERTION PORT-A-CATH;  Surgeon: [name], MD;  Location: [facility];  Service: General;  Laterality: Left;   RADIOACTIVE SEED GUIDED AXILLARY SENTINEL LYMPH NODE Right 07/09/2021   Procedure: RADIOACTIVE SEED GUIDED RIGHT AXILLARY SENTINEL LYMPH NODE DISSECTION;  Surgeon: [name], MD;  Location: [place];  Service: General;  Laterality: Right;   TUBAL LIGATION     Patient Active Problem List   Diagnosis Date Noted   Metastasis to bone ([place]) 05/20/2022   PSVT (paroxysmal supraventricular tachycardia) 07/08/2021   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   GERD (gastroesophageal reflux disease) 05/27/2021   Hyperlipidemia 05/27/2021   Hypertension 05/27/2021   Pain management contract signed 05/04/2021   Chronic pain syndrome (breast cancer) 05/04/2021   Palpitations 03/25/2021   Sinus bradycardia 03/25/2021   Daytime somnolence 03/25/2021   Snoring 03/25/2021   Obesity (BMI 30-39.9) 03/25/2021   Port-A-Cath in place 01/27/2021   Genetic testing 12/24/2020   Family history of uterine cancer 12/17/2020   Malignant neoplasm of upper-outer quadrant of right breast in female, estrogen receptor positive ([place]) 12/12/2020   Malignant neoplasm of upper-outer quadrant of left breast in female, estrogen receptor positive ([place]) 12/12/2020   Hypokalemia 01/02/2012  Asthma 01/01/2012   Bradycardia 01/01/2012   Syncope 12/31/2011   Mediastinal adenopathy 12/31/2011   Anemia 12/31/2011   Chest pain 12/31/2011    PCP: Juluis Mire,  REFERRING PROVIDER: Wilber Bihari, NP  REFERRING DIAG: Bilateral Breast swelling, decreased shoulder ROM bilaterally, Bilateral LE neuropathy  THERAPY DIAG:  Malignant neoplasm of upper-outer quadrant of right breast in female, estrogen receptor positive ([place])  Malignant neoplasm of upper-outer  quadrant of left breast in female, estrogen receptor positive ([place])  Abnormal posture  Disturbance of skin sensation  Disorder of the skin and subcutaneous tissue related to radiation, unspecified  Lymphedema, not elsewhere classified  ONSET DATE: 01/2021  Rationale for Evaluation and Treatment Rehabilitation  SUBJECTIVE                                                                                                                                                                                           SUBJECTIVE STATEMENT: I feel really congested today. I think its the change in the weather. Breast swelling is still up and down. I have had a lot of appts. The bottom of my back is fractured and the lower part is bad too.  I am going to have radiation. I have to be at the [facility] from 10:3m 2;00 in the afternoon today.  I have to have an infusion and an injection to strengthen my bones. 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 The radiation will be through the abdomen and my back and the Dr. WGeraldine Solarme to have PT for that too.   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 Yes NPRS scale: 9/10  Pain location:   back and legs Pain orientation: bilateral  PAIN TYPE: aching, burning, sharp, and tight Pain description: constant  Aggravating factors: household chores,lifting,sleeping,  Relieving factors: pain meds  PRECAUTIONS: Bilateral Lymphedema risk, neuropathy  WEIGHT BEARING RESTRICTIONS  No  FALLS:  Has patient fallen in last 6 months? 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[employer]'  LEISURE: shop, dominoes  HAND DOMINANCE : right   PRIOR LEVEL OF FUNCTION: Independent  PATIENT GOALS stop the pain,be able to reach without pain, be able to clean   OBJECTIVE  COGNITION:  Overall cognitive status: Within functional limits for tasks assessed   PALPATION: Very tender throughout bilateral UQ  OBSERVATIONS / OTHER ASSESSMENTS: Both breast with generalized swelling and enlarged pores  SENSATION:  Light touch: Deficits at lateral breast, medial arm inact     POSTURE: Forward head, rounded shoulders  UPPER EXTREMITY AROM/PROM:  A/PROM RIGHT   eval  Right 03/25/2022 Right 03/31/2022 Right 05/05/2022 Right 05/18/2022  Shoulder extension 42  48    Shoulder flexion 110 pulls under arm 148 150 159 164  Shoulder abduction 103 128 155 178 178  Shoulder internal rotation 55  60  68  Shoulder external rotation 79  93  99    (Blank rows = not tested)  A/PROM LEFT   eval Left 03/25/2022 Left 03/31/2022 Left  05/05/2022 LEFT 05/18/2022  Shoulder extension 52  52    Shoulder flexion 120 147 159 159 162  Shoulder abduction 115 154 168 178 178  Shoulder internal rotation 55  60  73  Shoulder external rotation 80  91  100    (Blank rows = not tested)   CERVICAL AROM: All within functional limits:     UPPER EXTREMITY STRENGTH: NT due to pain  LOWER EXTREMITY MMT: 4+/5  LYMPHEDEMA ASSESSMENTS:   SURGERY TYPE/DATE: 07/09/2021 BILATERAL BREAST LUMPECTOMY WITH RADIOACTIVE SEED LOCALIZATION RADIOACTIVE SEED GUIDED RIGHT AXILLARY TARGETED LYMPH NODE DISSECTION LEFT AXILLARY SENTINEL NODE BIOPSY LEFT AXILLARY LYMPH NODE  MAPPING WITH SENTIMAG  NUMBER OF LYMPH NODES REMOVED: Right 1/1, Left 0/1  CHEMOTHERAPY: neoadjuvant  RADIATION:yes bilaterally  HORMONE TREATMENT: yes  INFECTIONS: no  LYMPHEDEMA ASSESSMENTS:   LANDMARK RIGHT  eval  10 cm proximal to olecranon process 30.5  Olecranon process 27.1  10 cm proximal to ulnar styloid process 21.2  Just proximal to ulnar styloid process 16.6  Across hand at thumb web space 21.4  At base of 2nd digit 6.8  (Blank rows = not tested)  LANDMARK LEFT  eval  10 cm proximal to olecranon process 30.7  Olecranon process 26.7  10 cm proximal to ulnar styloid process 20.5  Just proximal to ulnar styloid process 16.3  Across hand at thumb web space 21.1  At base of 2nd digit 6.7  (Blank rows = not tested)   FUNCTIONAL TESTS:   SLS ; 2-3 seconds ea leg GAIT: Distance walked: 20 Assistive device utilized: None Level of assistance: Complete Independence     QUICK DASH SURVEY: 77%,  03/31/2022: 31.82 BREAST COMPLAINTS SURVEY  :75  TODAY'S TREATMENT   06/08/2022  STM to bilateral UT, posterior cervicals. Pectorals, and lats in supine with cocoa butter.  PROM bilateral shoulder flexion, scaption, abduction [id number] performed short neck, 5 diaphragmatic breaths, activated right axillary and inguinal LN's, right axillo-inguinal pathway and right breast lateral to pathway, then medial right breast to lateral and pathway in supine  ending with LN's.  Discussed therapy for back with pt and questioned when best time is to start; before radiation or after so she has better pain control, and bones are stronger from meds. She will ask MD.  06/04/2022  Pulleys for flexion, scaption and abduction x 2 min Tandem stance bilaterally x 2, SLS x 2 ea STM to bilateral UT, posterior cervicals. Pectorals, and lats in supine with  cocoa butter. Supine wand x4 flex and scaption, stargazer x 4, wall slides x 4 ea side  05/27/2022 STM to bilateral UT, posterior  cervicals. Pectorals, and lats in supine with cocoa butter [id number]:[id number] performed short neck, 5 diaphragmatic breaths, activated right axillary and inguinal LN's, right axillo-inguinal pathway and right breast lateral to pathway, then medial right breast to lateral and pathway in supine  ending with LN's. Therapist then performed same but to left breast.   05/20/2022 Practiced balance activities: tandem stance bilaterally x 2 ea, and SLS Bilaterally x 4 ea. After practice pt able to maintain SLS for a head count of 30. [id number] performed short neck, 5 diaphragmatic breaths, activated right axillary and inguinal LN's, right axillo-inguinal pathway and right breast lateral to pathway, then medial right breast to lateral and pathway in supine  ending with LN's. Therapist demonstrated for pt and then pt performed each position multiple times.    05/18/2022 Pulleys flexion x 2 min, scaption x 1 min, abduction x 3 min [id number] performed short neck, 5 diaphragmatic breaths, activated right axillary and inguinal LN's, right axillo-inguinal pathway and right breast lateral to pathway, then medial right breast to lateral and pathway in supine and Left SL ending with LN's. Emphasis on axilla and medial breast   05/10/2022 Pulleys x 2 min flexion and abduction, 1 min scaption, ball rolls forward on wall x 10  Scapular Retraction with Resistance  10 reps-  - Scapular Retraction with Resistance Advanced  10 reps-  - Shoulder External Rotation and Scapular Retraction with Resistance10 reps [id number]:[id number] performed short neck, 5 diaphragmatic breaths, activated right axillary and inguinal LN's, right axillo-inguinal pathway and right breast lateral to pathway, then medial right breast to lateral and pathway in supine and Left SL ending with LN's. Emphasis on axilla and medial breast    05/05/2022 Pulleys x 2 min flexion and abduction  GGE:[id number] performed short neck, 5 diaphragmatic breaths,  activated right axillary and inguinal LN's, right axillo-inguinal pathway and right breast lateral to pathway, then medial right breast to lateral and pathway ending with LN's. Emphasis on axilla and medial breast   04/29/2022  YYT:[id number] performed short neck, 5 diaphragmatic breaths, activated right axillary and inguinal LN's, right axillo-inguinal pathway and right breast lateral to pathway, then medial right breast to lateral and pathway ending with LN's. Therapist then did the same steps but on the left breast. PROM bilateral shoulders flex, scaption ,abd MFR to right axillary area of cording AROM bilateral shoulder flexion, scaption, horizontal abd.   04/12/2022 Pt observed with increased swelling right axillary region with continued swelling at bilateral breast. Adjusted pts bra up to the last notch to try and put compression more under the arm. Therapist performed short neck, 5 diaphragmatic breaths, activated right axillary and inguinal LN's, right axillo-inguinal pathway and right breast lateral to pathway, then medial right breast to lateral and pathway. Pt was instructed in short neck, breaths, LN activation and pathway and was given written handout. Pt was found to have several painful cords in the right axillary region. MFR was performed to cords with arm in scaption. PROM with MFR was performed for right shoulder flexion and abd.  03/31/2022 Pulleys x 1:30 flex, scaption, abduction Supine wand flexion and scaption x 5 ea, star gazer x 5 Measured bilateral shoulder ROM and checked goals Made spaghetti foam pads for medial side of bilateral breast where enlarged pores and fibrosis are present.  Marland Kitchen  PATIENT EDUCATION:  Education details: POC, Flexi touch, compression  bra and gave script. (Pt in agreement with sending demo to [facility]), supine wand, Access Code: [email or web address] G URL: [email or web address] Date: 05/10/2022 Prepared by: Cheral Almas  Exercises -  Scapular Retraction with Resistance  - 1 x daily - 7 x weekly - 1 sets - 10 reps - Scapular Retraction with Resistance Advanced  - 1 x daily - 7 x weekly - 1 sets - 10 reps - Shoulder External Rotation and Scapular Retraction with Resistance  - 1 x daily - 7 x weekly - 1 sets - 10 reps Person educated: Patient Education method: Explanation Education comprehension: verbalized understanding   HOME EXERCISE PROGRAM: Eval :None given today due to time  ASSESSMENT:  CLINICAL IMPRESSION:  Lengthy discussion with pt about continuing treatment and adding Back to the plan. She has an Lx vertebral body fx and involvement as well in the sacral region. Pt will ask MD about the best time to start therapy for her back if they prefer before, or after she has had radiation so she has better pain control. Pt continues with breast swelling and mild axillary swelling but still greatly improved from the beginning of therapy. Tightness in Right pectorals and tenderness continues. We are still waiting for an answer from [facility] about the Flexi touch. Will follow up with Megan today to see if she can contact Medicaid.    OBJECTIVE IMPAIRMENTS decreased activity tolerance, decreased balance, decreased knowledge of condition, difficulty walking, decreased ROM, increased edema, increased fascial restrictions, impaired sensation, impaired UE functional use, postural dysfunction, and pain.   ACTIVITY LIMITATIONS carrying, lifting, sleeping, bathing, dressing, and reach over head  PARTICIPATION LIMITATIONS: cleaning, occupation, and yard work  PERSONAL FACTORS 3+ comorbidities: Bilateral breast Cancer with chemo/radiation, pain management,  are also affecting patient's functional outcome.   REHAB POTENTIAL: Good  CLINICAL DECISION MAKING: Stable/uncomplicated  EVALUATION COMPLEXITY: Low  GOALS: Goals reviewed with patient? Yes  SHORT TERM GOALS: Target date: 06/29/2022    Pt will be independent and compliant  with a HEP for shoulder ROM and strength Baseline: Goal status: MET 2.  Pt will have improved bilateral shoulder flexion and abd to atleast 135 Baseline:  Goal status: MET 03/31/2022  3. Pt will have Flexi touch trial for breast lymphedema In Progress:MET  LONG TERM GOALS: Target date: 07/20/2022    Pt will have bilateral shoulder flexion atleast 145 degrees and abduction atleast 160 degrees for improved reaching ability Baseline:  Goal status: MET left 03/31/2022, progressing Right MET Right 05/05/2022  2.  Pts quick dash will be improved to no greater than 25% for improved function Baseline: 33% today Goal status: in PROGRESS 3.  Pt will be independent in self MLD to decrease bilateral breast swelling Baseline:  Goal status: In Progress  4.  Pt will purchase a compression bra to decrease breast swelling Baseline:  Goal status: MET 5.  Pt will be able to dress, bathe and perform reaching activities with minimal pain. Baseline:  Goal status: MET 05/05/2022  6. Pt will receive flexitouch sequential compression pump and will be independent in its use to decrease bilateral breast edema.  Baseline:hasn't received  Goal status New 7. Pt will improve SLS to 10 sec each leg to demonstrate improved balance and decrease risk of falls  Baseline 2-3 sec each side  Goal status:Deferred due to vertebral fx  PLAN: PT FREQUENCY: 2x/week  PT DURATION:   weeks  PLANNED INTERVENTIONS: Therapeutic exercises, Therapeutic activity, Neuromuscular re-education, Balance training, Patient/Family education, Self Care, Joint mobilization, Orthotic/Fit  training, Manual lymph drainage, scar mobilization, Vasopneumatic device, Manual therapy, and Re-evaluation  PLAN FOR NEXT SESSION: pt to wear compression bra; ,STM for tightness prn continue to review Bilateral breast MLD.still awaiting Flexitouch appropval, ROM and STM prn. Hold balance activities due to L3 vertebral body fx    Claris Pong,  PT 06/08/2022, 9:13 AM

## 2022-06-09 LAB — INTRINSIC FACTOR ANTIBODIES: Intrinsic Factor: 1 AU/mL (ref 0.0–1.1)

## 2022-06-10 ENCOUNTER — Other Ambulatory Visit (HOSPITAL_COMMUNITY): Payer: Self-pay

## 2022-06-10 ENCOUNTER — Ambulatory Visit: Payer: Medicaid Other

## 2022-06-10 DIAGNOSIS — C50411 Malignant neoplasm of upper-outer quadrant of right female breast: Secondary | ICD-10-CM

## 2022-06-10 DIAGNOSIS — L599 Disorder of the skin and subcutaneous tissue related to radiation, unspecified: Secondary | ICD-10-CM

## 2022-06-10 DIAGNOSIS — I890 Lymphedema, not elsewhere classified: Secondary | ICD-10-CM

## 2022-06-10 DIAGNOSIS — R209 Unspecified disturbances of skin sensation: Secondary | ICD-10-CM

## 2022-06-10 DIAGNOSIS — R293 Abnormal posture: Secondary | ICD-10-CM

## 2022-06-10 DIAGNOSIS — C50412 Malignant neoplasm of upper-outer quadrant of left female breast: Secondary | ICD-10-CM

## 2022-06-10 NOTE — Therapy (Signed)
OUTPATIENT PHYSICAL THERAPY ONCOLOGY TREATMENT  Patient Name: Chelsea Hansen MRN: [id number] DOB:09/10/1970, 51 y.o., female Today's Date: 06/10/2022   PT End of Session - 06/10/22 0901     Visit Number 16    Number of Visits 20    Date for PT Re-Evaluation 06/20/22    Authorization Type Medicaid    Authorization Time Period 6    PT Start Time 0901    PT Stop Time 0952    PT Time Calculation (min) 51 min    Activity Tolerance Patient tolerated treatment well    Behavior During Therapy [facility] for tasks assessed/performed              Past Medical History:  Diagnosis Date   Allergy    seasonal allergies   Anxiety    on meds   Asthma    uses inhaler   Breast cancer ([place]) 2012   RIGHT lumpectomy   Cancer ([place]) 2022   RIGHT breast lump-dx 2022   Depression    on meds   DVT (deep venous thrombosis) ([place]) 2010   after hysterectomy   Family history of uterine cancer    GERD (gastroesophageal reflux disease)    with certain foods/OTC PRN meds   Headache(784.0)    History of radiation therapy    Bilateral breast- 09/03/21-11/02/21- Dr. [name]   Hyperlipidemia    on meds   Hypertension    on meds   SVT (supraventricular tachycardia)    Past Surgical History:  Procedure Laterality Date   ABDOMINAL HYSTERECTOMY     AXILLARY SENTINEL NODE BIOPSY Left 07/09/2021   Procedure: LEFT AXILLARY SENTINEL NODE BIOPSY;  Surgeon: [name], MD;  Location: [place];  Service: General;  Laterality: Left;   BREAST EXCISIONAL BIOPSY Right 09/16/2009   BREAST LUMPECTOMY WITH RADIOACTIVE SEED LOCALIZATION Bilateral 07/09/2021   Procedure: BILATERAL BREAST LUMPECTOMY WITH RADIOACTIVE SEED LOCALIZATION;  Surgeon: [name], MD;  Location: [place];  Service: General;  Laterality: Bilateral;   BREAST SURGERY     lumpectomy   PORT-A-CATH REMOVAL Left 07/09/2021   Procedure: REMOVAL PORT-A-CATH;  Surgeon: [name], MD;   Location: [place];  Service: General;  Laterality: Left;   PORTACATH PLACEMENT Left 01/26/2021   Procedure: INSERTION PORT-A-CATH;  Surgeon: [name], MD;  Location: [facility];  Service: General;  Laterality: Left;   RADIOACTIVE SEED GUIDED AXILLARY SENTINEL LYMPH NODE Right 07/09/2021   Procedure: RADIOACTIVE SEED GUIDED RIGHT AXILLARY SENTINEL LYMPH NODE DISSECTION;  Surgeon: [name], MD;  Location: [place];  Service: General;  Laterality: Right;   TUBAL LIGATION     Patient Active Problem List   Diagnosis Date Noted   Metastasis to bone ([place]) 05/20/2022   PSVT (paroxysmal supraventricular tachycardia) 07/08/2021   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   GERD (gastroesophageal reflux disease) 05/27/2021   Hyperlipidemia 05/27/2021   Hypertension 05/27/2021   Pain management contract signed 05/04/2021   Chronic pain syndrome (breast cancer) 05/04/2021   Palpitations 03/25/2021   Sinus bradycardia 03/25/2021   Daytime somnolence 03/25/2021   Snoring 03/25/2021   Obesity (BMI 30-39.9) 03/25/2021   Port-A-Cath in place 01/27/2021   Genetic testing 12/24/2020   Family history of uterine cancer 12/17/2020   Malignant neoplasm of upper-outer quadrant of right breast in female, estrogen receptor positive ([place]) 12/12/2020   Malignant neoplasm of upper-outer quadrant of left breast in female, estrogen receptor positive ([place]) 12/12/2020   Hypokalemia 01/02/2012  Asthma 01/01/2012   Bradycardia 01/01/2012   Syncope 12/31/2011   Mediastinal adenopathy 12/31/2011   Anemia 12/31/2011   Chest pain 12/31/2011    PCP: Juluis Mire,  REFERRING PROVIDER: Wilber Bihari, NP  REFERRING DIAG: Bilateral Breast swelling, decreased shoulder ROM bilaterally, Bilateral LE neuropathy  THERAPY DIAG:  Malignant neoplasm of upper-outer quadrant of right breast in female, estrogen receptor positive ([place])  Malignant neoplasm of upper-outer  quadrant of left breast in female, estrogen receptor positive ([place])  Abnormal posture  Disturbance of skin sensation  Disorder of the skin and subcutaneous tissue related to radiation, unspecified  Lymphedema, not elsewhere classified  ONSET DATE: 01/2021  Rationale for Evaluation and Treatment Rehabilitation  SUBJECTIVE                                                                                                                                                                                           SUBJECTIVE STATEMENT:  Tomorrow I go for my bra fitting at 9:30. They gave me a different Oxycontin 2 x's per day, and another ocycodone every 12 hours. They are going to try and push my disability in 7-8 weeks.    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 Yes NPRS scale: 7/10, Left breast sharp intermittent Pain location:   back and legs Pain orientation: bilateral  PAIN TYPE: aching, burning, sharp, and tight Pain description: constant  Aggravating factors: household chores,lifting,sleeping,  Relieving factors: pain meds  PRECAUTIONS: Bilateral Lymphedema risk, neuropathy  WEIGHT BEARING RESTRICTIONS No  FALLS:  Has patient fallen in last 6 months? 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[employer]'  LEISURE: shop, dominoes  HAND DOMINANCE : right   PRIOR LEVEL OF FUNCTION: Independent  PATIENT GOALS stop the pain,be able to reach without pain, be able to clean   OBJECTIVE  COGNITION:  Overall cognitive status: Within functional limits for tasks assessed   PALPATION: Very tender throughout bilateral UQ  OBSERVATIONS / OTHER ASSESSMENTS: Both breast with generalized swelling  and enlarged pores  SENSATION:  Light touch: Deficits at lateral breast, medial arm inact     POSTURE: Forward head, rounded shoulders  UPPER EXTREMITY AROM/PROM:  A/PROM RIGHT   eval  Right 03/25/2022 Right 03/31/2022 Right 05/05/2022 Right 05/18/2022  Shoulder extension 42  48    Shoulder flexion 110 pulls under arm 148 150 159 164  Shoulder abduction 103 128 155 178 178  Shoulder internal rotation 55  60  68  Shoulder external rotation 79  93  99    (Blank rows = not tested)  A/PROM LEFT   eval Left 03/25/2022 Left 03/31/2022 Left  05/05/2022 LEFT 05/18/2022  Shoulder extension 52  52    Shoulder flexion 120 147 159 159 162  Shoulder abduction 115 154 168 178 178  Shoulder internal rotation 55  60  73  Shoulder external rotation 80  91  100    (Blank rows = not tested)   CERVICAL AROM: All within functional limits:     UPPER EXTREMITY STRENGTH: NT due to pain  LOWER EXTREMITY MMT: 4+/5  LYMPHEDEMA ASSESSMENTS:   SURGERY TYPE/DATE: 07/09/2021 BILATERAL BREAST LUMPECTOMY WITH RADIOACTIVE SEED LOCALIZATION RADIOACTIVE SEED GUIDED RIGHT AXILLARY TARGETED LYMPH NODE DISSECTION LEFT AXILLARY SENTINEL NODE BIOPSY LEFT AXILLARY LYMPH NODE MAPPING WITH SENTIMAG  NUMBER OF LYMPH NODES REMOVED: Right 1/1, Left 0/1  CHEMOTHERAPY: neoadjuvant  RADIATION:yes bilaterally  HORMONE TREATMENT: yes  INFECTIONS: no  LYMPHEDEMA ASSESSMENTS:   LANDMARK RIGHT  eval  10 cm proximal to olecranon process 30.5  Olecranon process 27.1  10 cm  proximal to ulnar styloid process 21.2  Just proximal to ulnar styloid process 16.6  Across hand at thumb web space 21.4  At base of 2nd digit 6.8  (Blank rows = not tested)  LANDMARK LEFT  eval  10 cm proximal to olecranon process 30.7  Olecranon process 26.7  10 cm proximal to ulnar styloid process 20.5  Just proximal to ulnar styloid process 16.3  Across hand at thumb web space 21.1  At base of 2nd digit 6.7  (Blank rows = not tested)   FUNCTIONAL TESTS:   SLS ; 2-3 seconds ea leg GAIT: Distance walked: 20 Assistive device utilized: None Level of assistance: Complete Independence     QUICK DASH SURVEY: 77%,  03/31/2022: 31.82 BREAST COMPLAINTS SURVEY  :75  TODAY'S TREATMENT   06/10/2022  STM to bilateral UT, posterior cervicals. Pectorals, and lats in supine with cocoa butter.  Supine wand flexion and scaption x 4 ea PROM bilateral shoulder flexion, scaption, abduction Supine AROM bilateral flexion, scaption, horizontal abduction  06/08/2022  STM to bilateral UT, posterior cervicals. Pectorals, and lats in supine with cocoa butter.  PROM bilateral shoulder flexion, scaption, abduction GYB:[id number] performed short neck, 5 diaphragmatic breaths, activated right axillary and inguinal LN's, right axillo-inguinal pathway and right breast lateral to pathway, then medial right breast to lateral and pathway in supine  ending with LN's.  Discussed therapy for back with pt and questioned when best time is to start; before radiation or after so she has better pain control, and bones are stronger from meds. She will ask MD.  06/04/2022  Pulleys for flexion, scaption and abduction x 2 min Tandem stance bilaterally x 2, SLS x 2 ea STM to bilateral UT, posterior cervicals. Pectorals, and lats in supine with cocoa butter. Supine wand x4 flex and scaption, stargazer x 4, wall slides x 4 ea side  05/27/2022 STM to bilateral UT, posterior cervicals. Pectorals, and lats in  supine  with cocoa butter GBM:[id number] performed short neck, 5 diaphragmatic breaths, activated right axillary and inguinal LN's, right axillo-inguinal pathway and right breast lateral to pathway, then medial right breast to lateral and pathway in supine  ending with LN's. Therapist then performed same but to left breast.   05/20/2022 Practiced balance activities: tandem stance bilaterally x 2 ea, and SLS Bilaterally x 4 ea. After practice pt able to maintain SLS for a head count of 30. YEM:[id number] performed short neck, 5 diaphragmatic breaths, activated right axillary and inguinal LN's, right axillo-inguinal pathway and right breast lateral to pathway, then medial right breast to lateral and pathway in supine  ending with LN's. Therapist demonstrated for pt and then pt performed each position multiple times.    05/18/2022 Pulleys flexion x 2 min, scaption x 1 min, abduction x 3 min PNP:[id number] performed short neck, 5 diaphragmatic breaths, activated right axillary and inguinal LN's, right axillo-inguinal pathway and right breast lateral to pathway, then medial right breast to lateral and pathway in supine and Left SL ending with LN's. Emphasis on axilla and medial breast   05/10/2022 Pulleys x 2 min flexion and abduction, 1 min scaption, ball rolls forward on wall x 10  Scapular Retraction with Resistance  10 reps-  - Scapular Retraction with Resistance Advanced  10 reps-  - Shoulder External Rotation and Scapular Retraction with Resistance10 reps ZNB:VAPOLIDCV performed short neck, 5 diaphragmatic breaths, activated right axillary and inguinal LN's, right axillo-inguinal pathway and right breast lateral to pathway, then medial right breast to lateral and pathway in supine and Left SL ending with LN's. Emphasis on axilla and medial breast    05/05/2022 Pulleys x 2 min flexion and abduction  UDT:HYHOOILNZ performed short neck, 5 diaphragmatic breaths, activated right axillary and inguinal  LN's, right axillo-inguinal pathway and right breast lateral to pathway, then medial right breast to lateral and pathway ending with LN's. Emphasis on axilla and medial breast   04/29/2022  VJK:QASUORVIF performed short neck, 5 diaphragmatic breaths, activated right axillary and inguinal LN's, right axillo-inguinal pathway and right breast lateral to pathway, then medial right breast to lateral and pathway ending with LN's. Therapist then did the same steps but on the left breast. PROM bilateral shoulders flex, scaption ,abd MFR to right axillary area of cording AROM bilateral shoulder flexion, scaption, horizontal abd.   04/12/2022 Pt observed with increased swelling right axillary region with continued swelling at bilateral breast. Adjusted pts bra up to the last notch to try and put compression more under the arm. Therapist performed short neck, 5 diaphragmatic breaths, activated right axillary and inguinal LN's, right axillo-inguinal pathway and right breast lateral to pathway, then medial right breast to lateral and pathway. Pt was instructed in short neck, breaths, LN activation and pathway and was given written handout. Pt was found to have several painful cords in the right axillary region. MFR was performed to cords with arm in scaption. PROM with MFR was performed for right shoulder flexion and abd.  03/31/2022 Pulleys x 1:30 flex, scaption, abduction Supine wand flexion and scaption x 5 ea, star gazer x 5 Measured bilateral shoulder ROM and checked goals Made spaghetti foam pads for medial side of bilateral breast where enlarged pores and fibrosis are present.  Marland Kitchen  PATIENT EDUCATION:  Education details: POC, Flexi touch, compression bra and gave script. (Pt in agreement with sending demo to [facility]), supine wand, Access Code: [email or web address] G URL: [email or web address] Date: 05/10/2022 Prepared by: Cheral Almas  Exercises -  Scapular Retraction with Resistance   - 1 x daily - 7 x weekly - 1 sets - 10 reps - Scapular Retraction with Resistance Advanced  - 1 x daily - 7 x weekly - 1 sets - 10 reps - Shoulder External Rotation and Scapular Retraction with Resistance  - 1 x daily - 7 x weekly - 1 sets - 10 reps Person educated: Patient Education method: Explanation Education comprehension: verbalized understanding   HOME EXERCISE PROGRAM: Eval :None given today due to time  ASSESSMENT:  CLINICAL IMPRESSION:  Pt with continued tightness/tenderness in bilateral pectorals, Lats, lateral trunk. She did well with exercises performed but felt tightness with most.   OBJECTIVE IMPAIRMENTS decreased activity tolerance, decreased balance, decreased knowledge of condition, difficulty walking, decreased ROM, increased edema, increased fascial restrictions, impaired sensation, impaired UE functional use, postural dysfunction, and pain.   ACTIVITY LIMITATIONS carrying, lifting, sleeping, bathing, dressing, and reach over head  PARTICIPATION LIMITATIONS: cleaning, occupation, and yard work  PERSONAL FACTORS 3+ comorbidities: Bilateral breast Cancer with chemo/radiation, pain management,  are also affecting patient's functional outcome.   REHAB POTENTIAL: Good  CLINICAL DECISION MAKING: Stable/uncomplicated  EVALUATION COMPLEXITY: Low  GOALS: Goals reviewed with patient? Yes  SHORT TERM GOALS: Target date: 07/01/2022    Pt will be independent and compliant with a HEP for shoulder ROM and strength Baseline: Goal status: MET 2.  Pt will have improved bilateral shoulder flexion and abd to atleast 135 Baseline:  Goal status: MET 03/31/2022  3. Pt will have Flexi touch trial for breast lymphedema In Progress:MET  LONG TERM GOALS: Target date: 07/22/2022    Pt will have bilateral shoulder flexion atleast 145 degrees and abduction atleast 160 degrees for improved reaching ability Baseline:  Goal status: MET left 03/31/2022, progressing Right MET Right  05/05/2022  2.  Pts quick dash will be improved to no greater than 25% for improved function Baseline: 33% today Goal status: in PROGRESS 3.  Pt will be independent in self MLD to decrease bilateral breast swelling Baseline:  Goal status: In Progress  4.  Pt will purchase a compression bra to decrease breast swelling Baseline:  Goal status: MET 5.  Pt will be able to dress, bathe and perform reaching activities with minimal pain. Baseline:  Goal status: MET 05/05/2022  6. Pt will receive flexitouch sequential compression pump and will be independent in its use to decrease bilateral breast edema.  Baseline:hasn't received  Goal status New 7. Pt will improve SLS to 10 sec each leg to demonstrate improved balance and decrease risk of falls  Baseline 2-3 sec each side  Goal status:Deferred due to vertebral fx  PLAN: PT FREQUENCY: 2x/week  PT DURATION:   weeks  PLANNED INTERVENTIONS: Therapeutic exercises, Therapeutic activity, Neuromuscular re-education, Balance training, Patient/Family education, Self Care, Joint mobilization, Orthotic/Fit training, Manual lymph drainage, scar mobilization, Vasopneumatic device, Manual therapy, and Re-evaluation  PLAN FOR NEXT SESSION: pt to wear compression bra; review self MLD,STM for tightness prn continue to review Bilateral breast MLD.still awaiting Flexitouch appropval, ROM and STM prn. Hold balance activities due to L3 vertebral body fx    Claris Pong, PT 06/10/2022, 9:54 AM

## 2022-06-11 ENCOUNTER — Telehealth: Payer: Self-pay | Admitting: Pharmacist

## 2022-06-11 ENCOUNTER — Inpatient Hospital Stay (HOSPITAL_BASED_OUTPATIENT_CLINIC_OR_DEPARTMENT_OTHER): Payer: Medicaid Other | Admitting: Nurse Practitioner

## 2022-06-11 ENCOUNTER — Encounter: Payer: Self-pay | Admitting: Nurse Practitioner

## 2022-06-11 DIAGNOSIS — R530 Neoplastic (malignant) related fatigue: Secondary | ICD-10-CM | POA: Diagnosis not present

## 2022-06-11 DIAGNOSIS — Z515 Encounter for palliative care: Secondary | ICD-10-CM

## 2022-06-11 DIAGNOSIS — G893 Neoplasm related pain (acute) (chronic): Secondary | ICD-10-CM | POA: Diagnosis not present

## 2022-06-11 DIAGNOSIS — K5903 Drug induced constipation: Secondary | ICD-10-CM

## 2022-06-11 NOTE — Telephone Encounter (Signed)
Scheduled appointment per 12/19 los. Patient is aware. 

## 2022-06-11 NOTE — Progress Notes (Signed)
[place]  [phone] [phone]   Name: Chelsea Hansen Date: 06/11/2022 MRN: [id number]  DOB: 1970-09-17  Patient Care Team: [name], NP as PCP - General (Internal Medicine) [name], DO as PCP - Cardiology (Cardiology) [name], MD as Consulting Physician (Radiation Oncology) [name], MD as Consulting Physician (General Surgery) [name], MD as Consulting Physician (Hematology and Oncology)   I connected with Chelsea Hansen on 06/11/22 at  9:20 AM EST by Phone  and verified that I am speaking with the correct person using two identifiers.   I discussed the limitations, risks, security and privacy concerns of performing an evaluation and management service by telemedicine and the availability of in-person appointments. I also discussed with the patient that there may be a patient responsible charge related to this service. The patient expressed understanding and agreed to proceed.   Other persons participating in the visit and their role in the encounter: [place], RN    Patient's location: Home   Provider's location: [facility]    Chief Complaint: Symptom Management follow-up    INTERVAL HISTORY: Chelsea Hansen is a 51 y.o. 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  MRI of L-spine (12/23) showed L3 pathologic fracture and significant lesion along S1 with sclerotic changes.  Palliative ask to see for symptom management and goals of care.   SOCIAL HISTORY:     reports that she has never smoked. She has never used smokeless tobacco. She reports that she does not currently use alcohol  after a past usage of about 7.0 standard drinks of alcohol per week. She reports that she does not use drugs.  ADVANCE DIRECTIVES:    CODE STATUS:   PAST MEDICAL HISTORY: Past Medical History:  Diagnosis Date   Allergy    seasonal allergies   Anxiety    on meds   Asthma    uses inhaler   Breast cancer ([place]) 2012   RIGHT lumpectomy   Cancer ([place]) 2022   RIGHT breast lump-dx 2022   Depression    on meds   DVT (deep venous thrombosis) ([place]) 2010   after hysterectomy   Family history of uterine cancer    GERD (gastroesophageal reflux disease)    with certain foods/OTC PRN meds   Headache(784.0)    History of radiation therapy    Bilateral breast- 09/03/21-11/02/21- Dr. [name]   Hyperlipidemia    on meds   Hypertension    on meds   SVT (supraventricular tachycardia)     ALLERGIES:  has No Known Allergies.  MEDICATIONS:  Current Outpatient Medications  Medication Sig Dispense Refill   abemaciclib (VERZENIO) 50 MG tablet Take 1 tablet (50 mg total) by mouth 2 (two) times daily. 60 tablet 3   albuterol (VENTOLIN HFA) 108 (90 Base) MCG/ACT inhaler Inhale 2 puffs by mouth into the lungs every 6 hours as needed for wheezing or shortness of breath. 18 g 2   amLODipine (NORVASC) 10 MG tablet Take 1 tablet  by mouth daily. 90 tablet 1   anastrozole (ARIMIDEX) 1 MG tablet Take 1 tablet (1 mg total) by mouth daily. 90 tablet 3   ARIPiprazole (ABILIFY) 15 MG tablet Take 1 tablet by mouth  at bedtime 30 tablet 0   escitalopram (LEXAPRO) 20 MG tablet Take 1 tablet (20 mg total) by mouth daily. 90 tablet 1   hydrochlorothiazide (HYDRODIURIL) 25 MG tablet Take 1 tablet by mouth daily. 30 tablet 0   montelukast (SINGULAIR) 10 MG tablet Take 1 tablet (10 mg total) by mouth at bedtime. 90 tablet 3   oxyCODONE ER 9 MG C12A Take 1 capsule by mouth 2 times daily. 30 capsule 0   oxyCODONE-acetaminophen (PERCOCET) 7.5-325 MG tablet Take 1 - 2 tablets by mouth every 6 (six) hours as needed  for severe pain. 120 tablet 0   pantoprazole (PROTONIX) 40 MG tablet Take 1 tablet (40 mg total) by mouth daily. 30 tablet 0   prochlorperazine (COMPAZINE) 10 MG tablet Take 1 tablet (10 mg total) by mouth every 6 (six) hours as needed for nausea or vomiting. 30 tablet 0   promethazine (PHENERGAN) 25 MG suppository Place 1 suppository (25 mg total) rectally every 6 (six) hours as needed for up to 3 days for nausea or vomiting. 12 suppository 0   propranolol (INDERAL) 10 MG tablet Take 1 tablet (10 mg total) by mouth 2 (two) times daily. 180 tablet 3   valsartan (DIOVAN) 40 MG tablet Take 1 tablet (40 mg total) by mouth daily. 90 tablet 3   No current facility-administered medications for this visit.    VITAL SIGNS: There were no vitals taken for this visit. There were no vitals filed for this visit.  Estimated body mass index is 30.67 kg/m as calculated from the following:   Height as of 06/08/22: '5\' 11"'$  (1.803 m).   Weight as of 06/08/22: 219 lb 14.4 oz (99.7 kg).   PERFORMANCE STATUS (ECOG) : 1 - Symptomatic but completely ambulatory    IMPRESSION: I connected with Chelsea Hansen by phone for symptom management follow-up.  No acute distress identified.  Patient expressing appreciation of how well she is feeling compared to previous days.  Denies nausea, vomiting, or diarrhea.  Is looking forward to spending the holidays with her family.  Neoplasm related pain Chelsea Hansen reports significant improvement in her ongoing back and shoulder pain since starting on new regimen.  Denies any symptoms related to medications.  Tolerating well.  Taking as directed.  We discussed at length her current regimen.  She is taking Xtampza 9 mg every 12 hours and Percocet every 6 hours as needed for breakthrough pain.  She understands we will continue with close follow-up maintaining current regimen given pain being well-controlled.  She knows to contact our office sooner if needed.  No changes will be made  today.  Constipation Encourage patient to continue with MiraLAX twice daily.  Education provided on having daily bowel movements with absence of constipation.  PLAN: Continue Xtampza 9 mg every 12 hours Continue Percocet every 6 hours as needed for breakthrough pain MiraLAX twice daily for bowel regimen Patient reports significant improvement in pain.  No changes made.  Well-controlled. I will plan to see patient back in 2-3 weeks in collaboration with her other oncology appointments.  Patient knows to contact our office sooner if needed.   Patient expressed understanding and was in agreement with this plan. She also understands that She can call the clinic at any time with any questions, concerns, or complaints.   Any controlled substances utilized were prescribed in the context of palliative care. PDMP has been reviewed.   Time Total: 40 min   Visit consisted of counseling and education dealing with the  complex and emotionally intense issues of symptom management and palliative care in the setting of serious and potentially life-threatening illness.Greater than 50%  of this time was spent counseling and coordinating care related to the above assessment and plan.  Alda Lea, AGPCNP-BC  Palliative Medicine Team/[place]

## 2022-06-15 ENCOUNTER — Other Ambulatory Visit: Payer: Self-pay

## 2022-06-15 ENCOUNTER — Ambulatory Visit: Payer: Medicaid Other

## 2022-06-15 ENCOUNTER — Ambulatory Visit
Admission: RE | Admit: 2022-06-15 | Discharge: 2022-06-15 | Disposition: A | Discharge status: Home / Self Care | Payer: Medicaid Other | Source: Ambulatory Visit | Attending: Radiation Oncology | Admitting: Radiation Oncology

## 2022-06-15 ENCOUNTER — Other Ambulatory Visit (HOSPITAL_COMMUNITY): Payer: Self-pay

## 2022-06-15 DIAGNOSIS — C50411 Malignant neoplasm of upper-outer quadrant of right female breast: Secondary | ICD-10-CM | POA: Diagnosis present

## 2022-06-15 DIAGNOSIS — R209 Unspecified disturbances of skin sensation: Secondary | ICD-10-CM

## 2022-06-15 DIAGNOSIS — Z170 Estrogen receptor positive status [ER+]: Secondary | ICD-10-CM

## 2022-06-15 DIAGNOSIS — R293 Abnormal posture: Secondary | ICD-10-CM

## 2022-06-15 DIAGNOSIS — I890 Lymphedema, not elsewhere classified: Secondary | ICD-10-CM

## 2022-06-15 DIAGNOSIS — C7951 Secondary malignant neoplasm of bone: Secondary | ICD-10-CM | POA: Insufficient documentation

## 2022-06-15 DIAGNOSIS — L599 Disorder of the skin and subcutaneous tissue related to radiation, unspecified: Secondary | ICD-10-CM

## 2022-06-15 NOTE — Therapy (Signed)
OUTPATIENT PHYSICAL THERAPY ONCOLOGY TREATMENT  Patient Name: Chelsea Hansen MRN: [id number] DOB:[birth year], 51 y.o., female Today's Date: 06/15/2022   PT End of Session - 06/15/22 0802     Visit Number 17    Number of Visits 20    Date for PT Re-Evaluation 06/20/22    Authorization Type Medicaid    PT Start Time 0803    PT Stop Time 0850    PT Time Calculation (min) 47 min    Activity Tolerance Patient tolerated treatment well    Behavior During Therapy [facility] for tasks assessed/performed              Past Medical History:  Diagnosis Date   Allergy    seasonal allergies   Anxiety    on meds   Asthma    uses inhaler   Breast cancer ([place]) 2012   RIGHT lumpectomy   Cancer ([place]) 2022   RIGHT breast lump-dx 2022   Depression    on meds   DVT (deep venous thrombosis) ([place]) 2010   after hysterectomy   Family history of uterine cancer    GERD (gastroesophageal reflux disease)    with certain foods/OTC PRN meds   Headache(784.0)    History of radiation therapy    Bilateral breast- 09/03/21-11/02/21- Dr. [name]   Hyperlipidemia    on meds   Hypertension    on meds   SVT (supraventricular tachycardia)    Past Surgical History:  Procedure Laterality Date   ABDOMINAL HYSTERECTOMY     AXILLARY SENTINEL NODE BIOPSY Left 07/09/2021   Procedure: LEFT AXILLARY SENTINEL NODE BIOPSY;  Surgeon: [name], MD;  Location: [place];  Service: General;  Laterality: Left;   BREAST EXCISIONAL BIOPSY Right 09/16/2009   BREAST LUMPECTOMY WITH RADIOACTIVE SEED LOCALIZATION Bilateral 07/09/2021   Procedure: BILATERAL BREAST LUMPECTOMY WITH RADIOACTIVE SEED LOCALIZATION;  Surgeon: [name], MD;  Location: [place];  Service: General;  Laterality: Bilateral;   BREAST SURGERY     lumpectomy   PORT-A-CATH REMOVAL Left 07/09/2021   Procedure: REMOVAL PORT-A-CATH;  Surgeon: [name], MD;  Location: [place];   Service: General;  Laterality: Left;   PORTACATH PLACEMENT Left 01/26/2021   Procedure: INSERTION PORT-A-CATH;  Surgeon: [name], MD;  Location: [facility];  Service: General;  Laterality: Left;   RADIOACTIVE SEED GUIDED AXILLARY SENTINEL LYMPH NODE Right 07/09/2021   Procedure: RADIOACTIVE SEED GUIDED RIGHT AXILLARY SENTINEL LYMPH NODE DISSECTION;  Surgeon: [name], MD;  Location: [place];  Service: General;  Laterality: Right;   TUBAL LIGATION     Patient Active Problem List   Diagnosis Date Noted   Metastasis to bone ([place]) 05/20/2022   PSVT (paroxysmal supraventricular tachycardia) 07/08/2021   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   GERD (gastroesophageal reflux disease) 05/27/2021   Hyperlipidemia 05/27/2021   Hypertension 05/27/2021   Pain management contract signed 05/04/2021   Chronic pain syndrome (breast cancer) 05/04/2021   Palpitations 03/25/2021   Sinus bradycardia 03/25/2021   Daytime somnolence 03/25/2021   Snoring 03/25/2021   Obesity (BMI 30-39.9) 03/25/2021   Port-A-Cath in place 01/27/2021   Genetic testing 12/24/2020   Family history of uterine cancer 12/17/2020   Malignant neoplasm of upper-outer quadrant of right breast in female, estrogen receptor positive ([place]) 12/12/2020   Malignant neoplasm of upper-outer quadrant of left breast in female, estrogen receptor positive ([place]) 12/12/2020   Hypokalemia 01/02/2012   Asthma 01/01/2012   Bradycardia 01/01/2012  Syncope 12/31/2011   Mediastinal adenopathy 12/31/2011   Anemia 12/31/2011   Chest pain 12/31/2011    PCP: Juluis Mire,  REFERRING PROVIDER: Wilber Bihari, NP  REFERRING DIAG: Bilateral Breast swelling, decreased shoulder ROM bilaterally, Bilateral LE neuropathy  THERAPY DIAG:  Malignant neoplasm of upper-outer quadrant of right breast in female, estrogen receptor positive ([name])  Malignant neoplasm of upper-outer quadrant of left breast in female,  estrogen receptor positive ([place])  Abnormal posture  Disturbance of skin sensation  Disorder of the skin and subcutaneous tissue related to radiation, unspecified  Lymphedema, not elsewhere classified  ONSET DATE: 01/2021  Rationale for Evaluation and Treatment Rehabilitation  SUBJECTIVE                                                                                                                                                                                           SUBJECTIVE STATEMENT:  I was able to get fit for more bras. 2 of them are camisoles. They are like compression bras. I can go back in January and get more so I will get more then. My left breast is slightly larger than my right so she gave me an insert for the right to even it out. I see the radiation doctor today. I hurt all over today but its raining and I just now took my meds. My shoulders feel tight today  HISTORY Neoadjuvant chemotherapy with Doxorubicin and Cyclophosphamide given x 4 beginning 01/27/2021 and completing on 03/10/2021 followed by weekly paclitaxel x 12 beginning 05/2021 07/09/2021: Right lumpectomy: Grade 2 invasive lobular cancer, 2.5 cm, LCIS, margins negative, LCIS focally at anterior margin, 1/1 lymph node positive, ER 95%, PR 100%, HER2 negative, Ki-67 40% Left lumpectomy: High-grade DCIS: 0.7 cm, margins negative, 0/1 lymph node negative ER 95%, PR 100%. Pt now with bone mets throughout thoracic and lumbar regions  PAIN:  Are you having pain? Yes NPRS scale: 10/10, lower back but I just took my meds and its raining. Pain location:   back and legs Pain orientation: bilateral  PAIN TYPE: aching, burning, sharp, and tight Pain description: constant  Aggravating factors: household chores,lifting,sleeping,  Relieving factors: pain meds  PRECAUTIONS: Bilateral Lymphedema risk, neuropathy  WEIGHT BEARING RESTRICTIONS No  FALLS:  Has patient fallen in last 6 months? No  LIVING  ENVIRONMENT: Lives with: lives with her fiancee Lives in: House/apartment Stairs: Yes; Internal: 1 flight to basement steps; on left going up Has following equipment at home: Single point cane, Walker - 4 wheeled, shower chair, and bed side commode  OCCUPATION: mgr at [employer]'  LEISURE: shop, dominoes  HAND DOMINANCE : right   PRIOR  LEVEL OF FUNCTION: Independent  PATIENT GOALS stop the pain,be able to reach without pain, be able to clean   OBJECTIVE  COGNITION:  Overall cognitive status: Within functional limits for tasks assessed   PALPATION: Very tender throughout bilateral UQ  OBSERVATIONS / OTHER ASSESSMENTS: Both breast with generalized swelling and enlarged pores  SENSATION:  Light touch: Deficits at lateral breast, medial arm inact     POSTURE: Forward head, rounded shoulders  UPPER EXTREMITY AROM/PROM:  A/PROM RIGHT   eval  Right 03/25/2022 Right 03/31/2022 Right 05/05/2022 Right 05/18/2022  Shoulder extension 42  48    Shoulder flexion 110 pulls under arm 148 150 159 164  Shoulder abduction 103 128 155 178 178  Shoulder internal rotation 55  60  68  Shoulder external rotation 79  93  99    (Blank rows = not tested)  A/PROM LEFT   eval Left 03/25/2022 Left 03/31/2022 Left  05/05/2022 LEFT 05/18/2022  Shoulder extension 52  52    Shoulder flexion 120 147 159 159 162  Shoulder abduction 115 154 168 178 178  Shoulder internal rotation 55  60  73  Shoulder external rotation 80  91  100    (Blank rows = not tested)   CERVICAL AROM: All within functional limits:     UPPER EXTREMITY STRENGTH: NT due to pain  LOWER EXTREMITY MMT: 4+/5  LYMPHEDEMA ASSESSMENTS:   SURGERY TYPE/DATE: 07/09/2021 BILATERAL BREAST LUMPECTOMY WITH RADIOACTIVE SEED LOCALIZATION RADIOACTIVE SEED GUIDED RIGHT AXILLARY TARGETED LYMPH NODE DISSECTION LEFT AXILLARY SENTINEL NODE BIOPSY LEFT AXILLARY LYMPH NODE MAPPING WITH SENTIMAG  NUMBER OF LYMPH NODES REMOVED: Right  1/1, Left 0/1  CHEMOTHERAPY: neoadjuvant  RADIATION:yes bilaterally  HORMONE TREATMENT: yes  INFECTIONS: no  LYMPHEDEMA ASSESSMENTS:   LANDMARK RIGHT  eval  10 cm proximal to olecranon process 30.5  Olecranon process 27.1  10 cm proximal to ulnar styloid process 21.2  Just proximal to ulnar styloid process 16.6  Across hand at thumb web space 21.4  At base of 2nd digit 6.8  (Blank rows = not tested)  LANDMARK LEFT  eval  10 cm proximal to olecranon process 30.7  Olecranon process 26.7  10 cm proximal to ulnar styloid process 20.5  Just proximal to ulnar styloid process 16.3  Across hand at thumb web space 21.1  At base of 2nd digit 6.7  (Blank rows = not tested)   FUNCTIONAL TESTS:   SLS ; 2-3 seconds ea leg GAIT: Distance walked: 20 Assistive device utilized: None Level of assistance: Complete Independence     QUICK DASH SURVEY: 77%,  03/31/2022: 31.82  BREAST COMPLAINTS SURVEY  :75  TODAY'S TREATMENT   06/15/2022  Overhead pulleys x 2 min flexion and abduction to improve shoulder ROM Supine wand flexion and scaption x 3 ea, stargazer x 5 [id number] performed short neck, 5 diaphragmatic breaths, activated right axillary and inguinal LN's, right axillo-inguinal pathway and right breast lateral to pathway, then medial right breast to lateral and pathway in supine  ending with LN's and pt practiced all steps after therapist PROM bilateral shoulder flexion, scaption, abd, ER  06/10/2022  STM to bilateral UT, posterior cervicals. Pectorals, and lats in supine with cocoa butter.  Supine wand flexion and scaption x 4 ea PROM bilateral shoulder flexion, scaption, abduction Supine AROM bilateral flexion, scaption, horizontal abduction  06/08/2022  STM to bilateral UT, posterior cervicals. Pectorals, and lats in supine with cocoa butter.  PROM bilateral shoulder flexion, scaption, abduction [id number] performed short neck,  5 diaphragmatic breaths,  activated right axillary and inguinal LN's, right axillo-inguinal pathway and right breast lateral to pathway, then medial right breast to lateral and pathway in supine  ending with LN's.  Discussed therapy for back with pt and questioned when best time is to start; before radiation or after so she has better pain control, and bones are stronger from meds. She will ask MD.  06/04/2022  Pulleys for flexion, scaption and abduction x 2 min Tandem stance bilaterally x 2, SLS x 2 ea STM to bilateral UT, posterior cervicals. Pectorals, and lats in supine with cocoa butter. Supine wand x4 flex and scaption, stargazer x 4, wall slides x 4 ea side  05/27/2022 STM to bilateral UT, posterior cervicals. Pectorals, and lats in supine with cocoa butter [id number] performed short neck, 5 diaphragmatic breaths, activated right axillary and inguinal LN's, right axillo-inguinal pathway and right breast lateral to pathway, then medial right breast to lateral and pathway in supine  ending with LN's. Therapist then performed same but to left breast.   05/20/2022 Practiced balance activities: tandem stance bilaterally x 2 ea, and SLS Bilaterally x 4 ea. After practice pt able to maintain SLS for a head count of 30. [id number] performed short neck, 5 diaphragmatic breaths, activated right axillary and inguinal LN's, right axillo-inguinal pathway and right breast lateral to pathway, then medial right breast to lateral and pathway in supine  ending with LN's. Therapist demonstrated for pt and then pt performed each position multiple times.    05/18/2022 Pulleys flexion x 2 min, scaption x 1 min, abduction x 3 min [id number] performed short neck, 5 diaphragmatic breaths, activated right axillary and inguinal LN's, right axillo-inguinal pathway and right breast lateral to pathway, then medial right breast to lateral and pathway in supine and Left SL ending with LN's. Emphasis on axilla and medial  breast   05/10/2022 Pulleys x 2 min flexion and abduction, 1 min scaption, ball rolls forward on wall x 10  Scapular Retraction with Resistance  10 reps-  - Scapular Retraction with Resistance Advanced  10 reps-  - Shoulder External Rotation and Scapular Retraction with Resistance10 reps [id number] performed short neck, 5 diaphragmatic breaths, activated right axillary and inguinal LN's, right axillo-inguinal pathway and right breast lateral to pathway, then medial right breast to lateral and pathway in supine and Left SL ending with LN's. Emphasis on axilla and medial breast    05/05/2022 Pulleys x 2 min flexion and abduction  WUG:[id number] performed short neck, 5 diaphragmatic breaths, activated right axillary and inguinal LN's, right axillo-inguinal pathway and right breast lateral to pathway, then medial right breast to lateral and pathway ending with LN's. Emphasis on axilla and medial breast   04/29/2022  UUE:[id number] performed short neck, 5 diaphragmatic breaths, activated right axillary and inguinal LN's, right axillo-inguinal pathway and right breast lateral to pathway, then medial right breast to lateral and pathway ending with LN's. Therapist then did the same steps but on the left breast. PROM bilateral shoulders flex, scaption ,abd MFR to right axillary area of cording AROM bilateral shoulder flexion, scaption, horizontal abd.   04/12/2022 Pt observed with increased swelling right axillary region with continued swelling at bilateral breast. Adjusted pts bra up to the last notch to try and put compression more under the arm. Therapist performed short neck, 5 diaphragmatic breaths, activated right axillary and inguinal LN's, right axillo-inguinal pathway and right breast lateral to pathway, then medial right breast to lateral and pathway. Pt was instructed in short  neck, breaths, LN activation and pathway and was given written handout. Pt was found to have several painful  cords in the right axillary region. MFR was performed to cords with arm in scaption. PROM with MFR was performed for right shoulder flexion and abd.  03/31/2022 Pulleys x 1:30 flex, scaption, abduction Supine wand flexion and scaption x 5 ea, star gazer x 5 Measured bilateral shoulder ROM and checked goals Made spaghetti foam pads for medial side of bilateral breast where enlarged pores and fibrosis are present.  Marland Kitchen  PATIENT EDUCATION:  Education details: POC, Flexi touch, compression bra and gave script. (Pt in agreement with sending demo to [facility]), supine wand, Access Code: [email or web address] G URL: [email or web address] Date: 05/10/2022 Prepared by: Cheral Almas  Exercises - Scapular Retraction with Resistance  - 1 x daily - 7 x weekly - 1 sets - 10 reps - Scapular Retraction with Resistance Advanced  - 1 x daily - 7 x weekly - 1 sets - 10 reps - Shoulder External Rotation and Scapular Retraction with Resistance  - 1 x daily - 7 x weekly - 1 sets - 10 reps Person educated: Patient Education method: Explanation Education comprehension: verbalized understanding   HOME EXERCISE PROGRAM: Eval :None given today due to time  ASSESSMENT:  CLINICAL IMPRESSION:  Reviewed self MLD of right breast with pt after demonstrating techniques. She required occasional VC's and [employer] [facility] to perform initially, but then made good improvement. Bilateral shoulders and chest tight today and pt was encouraged to gently stretch again today.  OBJECTIVE IMPAIRMENTS decreased activity tolerance, decreased balance, decreased knowledge of condition, difficulty walking, decreased ROM, increased edema, increased fascial restrictions, impaired sensation, impaired UE functional use, postural dysfunction, and pain.   ACTIVITY LIMITATIONS carrying, lifting, sleeping, bathing, dressing, and reach over head  PARTICIPATION LIMITATIONS: cleaning, occupation, and yard work  PERSONAL FACTORS 3+  comorbidities: Bilateral breast Cancer with chemo/radiation, pain management,  are also affecting patient's functional outcome.   REHAB POTENTIAL: Good  CLINICAL DECISION MAKING: Stable/uncomplicated  EVALUATION COMPLEXITY: Low  GOALS: Goals reviewed with patient? Yes  SHORT TERM GOALS: Target date: 07/06/2022    Pt will be independent and compliant with a HEP for shoulder ROM and strength Baseline: Goal status: MET 2.  Pt will have improved bilateral shoulder flexion and abd to atleast 135 Baseline:  Goal status: MET 03/31/2022  3. Pt will have Flexi touch trial for breast lymphedema In Progress:MET  LONG TERM GOALS: Target date: 07/27/2022    Pt will have bilateral shoulder flexion atleast 145 degrees and abduction atleast 160 degrees for improved reaching ability Baseline:  Goal status: MET left 03/31/2022, progressing Right MET Right 05/05/2022  2.  Pts quick dash will be improved to no greater than 25% for improved function Baseline: 33% today Goal status: in PROGRESS 3.  Pt will be independent in self MLD to decrease bilateral breast swelling Baseline:  Goal status: In Progress  4.  Pt will purchase a compression bra to decrease breast swelling Baseline:  Goal status: MET 5.  Pt will be able to dress, bathe and perform reaching activities with minimal pain. Baseline:  Goal status: MET 05/05/2022  6. Pt will receive flexitouch sequential compression pump and will be independent in its use to decrease bilateral breast edema.  Baseline:hasn't received  Goal status New 7. Pt will improve SLS to 10 sec each leg to demonstrate improved balance and decrease risk of falls  Baseline 2-3 sec each side  Goal status:Deferred due to vertebral  fx  PLAN: PT FREQUENCY: 2x/week  PT DURATION:   weeks  PLANNED INTERVENTIONS: Therapeutic exercises, Therapeutic activity, Neuromuscular re-education, Balance training, Patient/Family education, Self Care, Joint mobilization,  Orthotic/Fit training, Manual lymph drainage, scar mobilization, Vasopneumatic device, Manual therapy, and Re-evaluation  PLAN FOR NEXT SESSION: pt to wear compression bra; review self MLD,STM for tightness prn continue to review Bilateral breast MLD.still awaiting Flexitouch appropval, ROM and STM prn. Hold balance activities due to L3 vertebral body fx    Claris Pong, PT 06/15/2022, 8:56 AM

## 2022-06-17 ENCOUNTER — Ambulatory Visit: Payer: Medicaid Other

## 2022-06-17 ENCOUNTER — Other Ambulatory Visit (HOSPITAL_COMMUNITY): Payer: Self-pay

## 2022-06-17 DIAGNOSIS — Z170 Estrogen receptor positive status [ER+]: Secondary | ICD-10-CM

## 2022-06-17 DIAGNOSIS — R293 Abnormal posture: Secondary | ICD-10-CM

## 2022-06-17 DIAGNOSIS — C7951 Secondary malignant neoplasm of bone: Secondary | ICD-10-CM | POA: Diagnosis not present

## 2022-06-17 DIAGNOSIS — L599 Disorder of the skin and subcutaneous tissue related to radiation, unspecified: Secondary | ICD-10-CM

## 2022-06-17 DIAGNOSIS — I890 Lymphedema, not elsewhere classified: Secondary | ICD-10-CM

## 2022-06-17 DIAGNOSIS — C50411 Malignant neoplasm of upper-outer quadrant of right female breast: Secondary | ICD-10-CM | POA: Diagnosis not present

## 2022-06-17 DIAGNOSIS — R209 Unspecified disturbances of skin sensation: Secondary | ICD-10-CM

## 2022-06-17 NOTE — Therapy (Signed)
OUTPATIENT PHYSICAL THERAPY ONCOLOGY TREATMENT  Patient Name: Chelsea Hansen MRN: [id number] DOB:03-02-1971, 51 y.o., female Today's Date: 06/17/2022   PT End of Session - 06/17/22 0803     Visit Number 18    Number of Visits 20    Date for PT Re-Evaluation 06/20/22    Authorization Type Medicaid    PT Start Time 0804    PT Stop Time 0850    PT Time Calculation (min) 46 min    Activity Tolerance Patient tolerated treatment well    Behavior During Therapy [facility] for tasks assessed/performed              Past Medical History:  Diagnosis Date   Allergy    seasonal allergies   Anxiety    on meds   Asthma    uses inhaler   Breast cancer ([place]) 2012   RIGHT lumpectomy   Cancer ([place]) 2022   RIGHT breast lump-dx 2022   Depression    on meds   DVT (deep venous thrombosis) ([place]) 2010   after hysterectomy   Family history of uterine cancer    GERD (gastroesophageal reflux disease)    with certain foods/OTC PRN meds   Headache(784.0)    History of radiation therapy    Bilateral breast- 09/03/21-11/02/21- Dr. [name]   Hyperlipidemia    on meds   Hypertension    on meds   SVT (supraventricular tachycardia)    Past Surgical History:  Procedure Laterality Date   ABDOMINAL HYSTERECTOMY     AXILLARY SENTINEL NODE BIOPSY Left 07/09/2021   Procedure: LEFT AXILLARY SENTINEL NODE BIOPSY;  Surgeon: [name], MD;  Location: [place];  Service: General;  Laterality: Left;   BREAST EXCISIONAL BIOPSY Right 09/16/2009   BREAST LUMPECTOMY WITH RADIOACTIVE SEED LOCALIZATION Bilateral 07/09/2021   Procedure: BILATERAL BREAST LUMPECTOMY WITH RADIOACTIVE SEED LOCALIZATION;  Surgeon: [name], MD;  Location: [place];  Service: General;  Laterality: Bilateral;   BREAST SURGERY     lumpectomy   PORT-A-CATH REMOVAL Left 07/09/2021   Procedure: REMOVAL PORT-A-CATH;  Surgeon: [name], MD;  Location: [place];   Service: General;  Laterality: Left;   PORTACATH PLACEMENT Left 01/26/2021   Procedure: INSERTION PORT-A-CATH;  Surgeon: [name], MD;  Location: [facility];  Service: General;  Laterality: Left;   RADIOACTIVE SEED GUIDED AXILLARY SENTINEL LYMPH NODE Right 07/09/2021   Procedure: RADIOACTIVE SEED GUIDED RIGHT AXILLARY SENTINEL LYMPH NODE DISSECTION;  Surgeon: [name], MD;  Location: [place];  Service: General;  Laterality: Right;   TUBAL LIGATION     Patient Active Problem List   Diagnosis Date Noted   Metastasis to bone ([place]) 05/20/2022   PSVT (paroxysmal supraventricular tachycardia) 07/08/2021   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   GERD (gastroesophageal reflux disease) 05/27/2021   Hyperlipidemia 05/27/2021   Hypertension 05/27/2021   Pain management contract signed 05/04/2021   Chronic pain syndrome (breast cancer) 05/04/2021   Palpitations 03/25/2021   Sinus bradycardia 03/25/2021   Daytime somnolence 03/25/2021   Snoring 03/25/2021   Obesity (BMI 30-39.9) 03/25/2021   Port-A-Cath in place 01/27/2021   Genetic testing 12/24/2020   Family history of uterine cancer 12/17/2020   Malignant neoplasm of upper-outer quadrant of right breast in female, estrogen receptor positive ([place]) 12/12/2020   Malignant neoplasm of upper-outer quadrant of left breast in female, estrogen receptor positive ([place]) 12/12/2020   Hypokalemia 01/02/2012   Asthma 01/01/2012   Bradycardia 01/01/2012  Syncope 12/31/2011   Mediastinal adenopathy 12/31/2011   Anemia 12/31/2011   Chest pain 12/31/2011    PCP: Juluis Mire,  REFERRING PROVIDER: Wilber Bihari, NP  REFERRING DIAG: Bilateral Breast swelling, decreased shoulder ROM bilaterally, Bilateral LE neuropathy  THERAPY DIAG:  Malignant neoplasm of upper-outer quadrant of right breast in female, estrogen receptor positive ([place])  Malignant neoplasm of upper-outer quadrant of left breast in female,  estrogen receptor positive ([place])  Abnormal posture  Disturbance of skin sensation  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 I can do my hair now.  Breast swelling is still present but better overall.  I have been doing the MLD and I feel pretty good about that. I get stiff if I don't keep up with the exercises. With my new diagnosis of bone mets my doctor wants me to have PT for my LB. I start radiation for my back next Wednesday for 2 weeks.  HISTORY Neoadjuvant chemotherapy with Doxorubicin and Cyclophosphamide given x 4 beginning 01/27/2021 and completing on 03/10/2021 followed by weekly paclitaxel x 12 beginning 05/2021 07/09/2021: Right lumpectomy: Grade 2 invasive lobular cancer, 2.5 cm, LCIS, margins negative, LCIS focally at anterior margin, 1/1 lymph node positive, ER 95%, PR 100%, HER2 negative, Ki-67 40% Left lumpectomy: High-grade DCIS: 0.7 cm, margins negative, 0/1 lymph node negative ER 95%, PR 100%. Pt now with bone mets throughout thoracic and lumbar regions and is pending radiation to her lumbar spine next week.  PAIN:  Are you having pain? Yes NPRS scale: 5/10, lower back but I just took my meds and its raining. Pain location:   back and legs Pain orientation: bilateral  PAIN TYPE: aching, burning, sharp, and tight Pain description: constant  Aggravating factors: household chores,lifting,sleeping,  Relieving factors: pain meds  PRECAUTIONS: Bilateral Lymphedema risk, neuropathy  WEIGHT BEARING RESTRICTIONS No  FALLS:  Has patient fallen in last 6 months?  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  HAND DOMINANCE :  right   PRIOR LEVEL OF FUNCTION: Independent  PATIENT GOALS stop the pain,be able to reach without pain, be able to clean   OBJECTIVE  COGNITION:  Overall cognitive status: Within functional limits for tasks assessed   PALPATION: Very tender throughout bilateral UQ  OBSERVATIONS / OTHER ASSESSMENTS: Both breast with generalized swelling and enlarged pores  SENSATION:  Light touch: Deficits at lateral breast, medial arm inact     POSTURE: Forward head, rounded shoulders  UPPER EXTREMITY AROM/PROM:  A/PROM RIGHT   eval  Right 03/25/2022 Right 03/31/2022 Right 05/05/2022 Right 05/18/2022 RIGHT 06/17/2022  Shoulder extension 42  48   60  Shoulder flexion 110 pulls under arm 148 150 159 164 163  Shoulder abduction 103 128 155 178 178 175  Shoulder internal rotation 55  60  68 67  Shoulder external rotation 79  93  99 100    (Blank rows = not tested)  A/PROM LEFT   eval Left 03/25/2022 Left 03/31/2022 Left  05/05/2022 LEFT 05/18/2022 LEFT 06/17/2022  Shoulder extension 52  52   60  Shoulder flexion 120 147 159 159 162 162  Shoulder abduction 115 154 168 178 178 177  Shoulder internal rotation 55  60  73 72  Shoulder external rotation 80  91  100 97    (Blank rows = not tested)   CERVICAL AROM: All within functional limits:     UPPER EXTREMITY STRENGTH: NT due to pain  LOWER EXTREMITY MMT: 4+/5  LYMPHEDEMA ASSESSMENTS:   SURGERY TYPE/DATE: 07/09/2021 BILATERAL BREAST LUMPECTOMY WITH RADIOACTIVE SEED LOCALIZATION RADIOACTIVE SEED GUIDED RIGHT AXILLARY TARGETED LYMPH NODE DISSECTION LEFT AXILLARY SENTINEL NODE BIOPSY LEFT  AXILLARY LYMPH NODE MAPPING WITH SENTIMAG  NUMBER OF LYMPH NODES REMOVED: Right 1/1, Left 0/1  CHEMOTHERAPY: neoadjuvant  RADIATION:yes bilaterally  HORMONE TREATMENT: yes  INFECTIONS: no  LYMPHEDEMA ASSESSMENTS:   LANDMARK RIGHT  eval  10 cm proximal to olecranon process 30.5  Olecranon process 27.1  10 cm proximal to ulnar styloid process 21.2  Just proximal to ulnar styloid process 16.6  Across hand at thumb web space 21.4  At base of 2nd digit 6.8  (Blank rows = not tested)  LANDMARK LEFT  eval  10 cm proximal to olecranon process 30.7  Olecranon process 26.7  10 cm proximal to ulnar styloid process 20.5  Just proximal to ulnar styloid process 16.3  Across hand at thumb web space 21.1  At base of 2nd digit 6.7  (Blank rows = not tested)   FUNCTIONAL TESTS:   SLS ; 2-3 seconds ea leg GAIT: Distance walked: 20 Assistive device utilized: None Level of assistance: Complete Independence     QUICK DASH SURVEY: 77% eval,  03/31/2022: 31.82, 06/17/2022 20.45  BREAST COMPLAINTS SURVEY  :75 eval  TODAY'S TREATMENT   06/17/2022 Wall slides B x 5 ea Supine wand flexion and scaption x 5 PROM bilateral shoulder flexion, scaption, abduction UVO:[id number] performed short neck, 5 diaphragmatic breaths, activated right axillary and inguinal LN's, right axillo-inguinal pathway and right breast lateral to pathway, then medial right breast to lateral and pathway in supine  ending with LN's Measured ROM and checked goals 06/15/2022  Overhead pulleys x 2 min flexion and abduction to improve shoulder ROM Supine wand flexion and scaption x 3 ea, stargazer x 5 [id number] performed short neck, 5 diaphragmatic breaths, activated right axillary and inguinal LN's, right axillo-inguinal pathway and right breast lateral to pathway, then medial right breast to lateral and pathway in supine  ending  with LN's and pt practiced all steps after therapist PROM bilateral shoulder  flexion, scaption, abd, ER  06/10/2022  STM to bilateral UT, posterior cervicals. Pectorals, and lats in supine with cocoa butter.  Supine wand flexion and scaption x 4 ea PROM bilateral shoulder flexion, scaption, abduction Supine AROM bilateral flexion, scaption, horizontal abduction  06/08/2022  STM to bilateral UT, posterior cervicals. Pectorals, and lats in supine with cocoa butter.  PROM bilateral shoulder flexion, scaption, abduction [id number] performed short neck, 5 diaphragmatic breaths, activated right axillary and inguinal LN's, right axillo-inguinal pathway and right breast lateral to pathway, then medial right breast to lateral and pathway in supine  ending with LN's.  Discussed therapy for back with pt and questioned when best time is to start; before radiation or after so she has better pain control, and bones are stronger from meds. She will ask MD.  06/04/2022  Pulleys for flexion, scaption and abduction x 2 min Tandem stance bilaterally x 2, SLS x 2 ea STM to bilateral UT, posterior cervicals. Pectorals, and lats in supine with cocoa butter. Supine wand x4 flex and scaption, stargazer x 4, wall slides x 4 ea side  05/27/2022 STM to bilateral UT, posterior cervicals. Pectorals, and lats in supine with cocoa butter [id number] performed short neck, 5 diaphragmatic breaths, activated right axillary and inguinal LN's, right axillo-inguinal pathway and right breast lateral to pathway, then medial right breast to lateral and pathway in supine  ending with LN's. Therapist then performed same but to left breast.   05/20/2022 Practiced balance activities: tandem stance bilaterally x 2 ea, and SLS Bilaterally x 4 ea. After practice pt able to maintain SLS for a head count of 30. [id number] performed short neck, 5 diaphragmatic breaths, activated right axillary and inguinal LN's, right axillo-inguinal pathway and right breast lateral to pathway, then medial right breast  to lateral and pathway in supine  ending with LN's. Therapist demonstrated for pt and then pt performed each position multiple times.    05/18/2022 Pulleys flexion x 2 min, scaption x 1 min, abduction x 3 min TTS:[id number] performed short neck, 5 diaphragmatic breaths, activated right axillary and inguinal LN's, right axillo-inguinal pathway and right breast lateral to pathway, then medial right breast to lateral and pathway in supine and Left SL ending with LN's. Emphasis on axilla and medial breast   05/10/2022 Pulleys x 2 min flexion and abduction, 1 min scaption, ball rolls forward on wall x 10  Scapular Retraction with Resistance  10 reps-  - Scapular Retraction with Resistance Advanced  10 reps-  - Shoulder External Rotation and Scapular Retraction with Resistance10 reps SPQ:[id number] performed short neck, 5 diaphragmatic breaths, activated right axillary and inguinal LN's, right axillo-inguinal pathway and right breast lateral to pathway, then medial right breast to lateral and pathway in supine and Left SL ending with LN's. Emphasis on axilla and medial breast    05/05/2022 Pulleys x 2 min flexion and abduction  JFH:[id number] performed short neck, 5 diaphragmatic breaths, activated right axillary and inguinal LN's, right axillo-inguinal pathway and right breast lateral to pathway, then medial right breast to lateral and pathway ending with LN's. Emphasis on axilla and medial breast   04/29/2022  LHT:[id number] performed short neck, 5 diaphragmatic breaths, activated right axillary and inguinal LN's, right axillo-inguinal pathway and right breast lateral to pathway, then medial right breast to lateral and pathway ending with LN's. Therapist then did the same steps but on the left breast. PROM bilateral shoulders flex, scaption ,abd  MFR to right axillary area of cording AROM bilateral shoulder flexion, scaption, horizontal abd.   04/12/2022 Pt observed with increased swelling  right axillary region with continued swelling at bilateral breast. Adjusted pts bra up to the last notch to try and put compression more under the arm. Therapist performed short neck, 5 diaphragmatic breaths, activated right axillary and inguinal LN's, right axillo-inguinal pathway and right breast lateral to pathway, then medial right breast to lateral and pathway. Pt was instructed in short neck, breaths, LN activation and pathway and was given written handout. Pt was found to have several painful cords in the right axillary region. MFR was performed to cords with arm in scaption. PROM with MFR was performed for right shoulder flexion and abd.  03/31/2022 Pulleys x 1:30 flex, scaption, abduction Supine wand flexion and scaption x 5 ea, star gazer x 5 Measured bilateral shoulder ROM and checked goals Made spaghetti foam pads for medial side of bilateral breast where enlarged pores and fibrosis are present.  Marland Kitchen  PATIENT EDUCATION:  Education details: POC, Flexi touch, compression bra and gave script. (Pt in agreement with sending demo to [facility]), supine wand, Access Code: [email or web address] G URL: [email or web address] Date: 05/10/2022 Prepared by: Cheral Almas  Exercises - Scapular Retraction with Resistance  - 1 x daily - 7 x weekly - 1 sets - 10 reps - Scapular Retraction with Resistance Advanced  - 1 x daily - 7 x weekly - 1 sets - 10 reps - Shoulder External Rotation and Scapular Retraction with Resistance  - 1 x daily - 7 x weekly - 1 sets - 10 reps Person educated: Patient Education method: Explanation Education comprehension: verbalized understanding   HOME EXERCISE PROGRAM: Eval :None given today due to time  ASSESSMENT:  CLINICAL IMPRESSION: Pt has achieved all applicable goals established at initial evaluation/Recert except that she has not received her Flexitouch Sequential Pump which she just got a denial letter for due to not all information being received  by Medicaid. Shoulder ROM is WNL bilaterally and she has achieved her Quick dash goal for improved function. She is independent in self MLD for breast swelling. Breast swelling is still present bilaterally, but improved from when she was first being treated.  She is compliant with wearing a compression bra and with doing her HEP.She has now been diagnosed with Stage IV Breast Cancer with Metastasis and will be evaluated in January with a new referral.   OBJECTIVE IMPAIRMENTS decreased activity tolerance, decreased balance, decreased knowledge of condition, difficulty walking, decreased ROM, increased edema, increased fascial restrictions, impaired sensation, impaired UE functional use, postural dysfunction, and pain.   ACTIVITY LIMITATIONS carrying, lifting, sleeping, bathing, dressing, and reach over head  PARTICIPATION LIMITATIONS: cleaning, occupation, and yard work  PERSONAL FACTORS 3+ comorbidities: Bilateral breast Cancer with chemo/radiation, pain management,  are also affecting patient's functional outcome.   REHAB POTENTIAL: Good  CLINICAL DECISION MAKING: Stable/uncomplicated  EVALUATION COMPLEXITY: Low  GOALS: Goals reviewed with patient? Yes  SHORT TERM GOALS: Target date: 07/08/2022    Pt will be independent and compliant with a HEP for shoulder ROM and strength Baseline: Goal status: MET 2.  Pt will have improved bilateral shoulder flexion and abd to atleast 135 Baseline:  Goal status: MET 03/31/2022  3. Pt will have Flexi touch trial for breast lymphedema In Progress:MET  LONG TERM GOALS: Target date: 07/29/2022    Pt will have bilateral shoulder flexion atleast 145 degrees and abduction atleast 160 degrees for improved reaching ability Baseline:  Goal status: MET left 03/31/2022, progressing Right MET Right 05/05/2022  2.  Pts quick dash will be improved to no greater than 25% for improved function Baseline: 33% today Goal status: MET  (20.45 06/17/2022) 3.  Pt  will be independent in self MLD to decrease bilateral breast swelling Baseline:  Goal status: MET  4.  Pt will purchase a compression bra to decrease breast swelling Baseline:  Goal status: MET 5.  Pt will be able to dress, bathe and perform reaching activities with minimal pain. Baseline:  Goal status: MET 05/05/2022  6. Pt will receive flexitouch sequential compression pump and will be independent in its use to decrease bilateral breast edema.  Baseline:hasn't received  Goal status NOT MET 7. Pt will improve SLS to 10 sec each leg to demonstrate improved balance and decrease risk of falls  Baseline 2-3 sec each side  Goal status:Deferred due to vertebral fx  PLAN: PT FREQUENCY: 2x/week  PT DURATION:   weeks  PLANNED INTERVENTIONS: Therapeutic exercises, Therapeutic activity, Neuromuscular re-education, Balance training, Patient/Family education, Self Care, Joint mobilization, Orthotic/Fit training, Manual lymph drainage, scar mobilization, Vasopneumatic device, Manual therapy, and Re-evaluation  PLAN FOR NEXT SESSION:  Pt is discharged from formal PT however, she will resume in January with a new diagnosis PHYSICAL THERAPY DISCHARGE SUMMARY  Visits from Start of Care: 18  Current functional level related to goals / functional outcomes: Achieved applicable goals except Flexi touch; Medicaid missing some information   Remaining deficits: Mild breast swelling remains bilaterally, intermittent muscular tightness limiting ROM   Education / Equipment: Compression bra,theraband, HEP instruction for ROM/strength   Patient agrees to discharge. Patient goals were met. Patient is being discharged due to meeting the stated rehab goals.   Claris Pong, PT 06/17/2022, 12:20 PM

## 2022-06-18 ENCOUNTER — Other Ambulatory Visit (HOSPITAL_COMMUNITY): Payer: Self-pay

## 2022-06-18 ENCOUNTER — Telehealth: Payer: Self-pay

## 2022-06-18 DIAGNOSIS — Z170 Estrogen receptor positive status [ER+]: Secondary | ICD-10-CM

## 2022-06-18 MED ORDER — PROCHLORPERAZINE MALEATE 10 MG PO TABS
10.0000 mg | ORAL_TABLET | Freq: Four times a day (QID) | ORAL | 0 refills | Status: DC | PRN
  Filled 2022-06-18: qty 30, 8d supply, fill #0

## 2022-06-18 NOTE — Telephone Encounter (Signed)
Called Chelsea Hansen back to talk with her about what Jenny Reichmann discussed with me.  She said she is not having regular bowel movements and cannot drink water because it causes her nausea.  She is taking her pain medications and I explained they can cause constipation and nausea can ensue from the constipation. She said that she is going to continue the verzenio for now. She took compazine and that helped her with the nausea.  She will see Jenny Reichmann next week to discuss further.

## 2022-06-18 NOTE — Telephone Encounter (Signed)
Mrs. Palka called to let us know that she is taking her Verzenio that you started her on in early December but it is causing her to be very nauseated with periods of vomiting. I inquired if she was taking her compazine and she said she would try that. She also wanted to see if she could get a refill on her phenergan suppositories Anda Kraft in [facility] prescribed them last) and I told her I would have to check on that refill.  She does need a refill on her compazine but she is not back in [place] until Tuesday.  She is visiting family in [place] until then.  She stated that one of her medicines keeps her up all not but we went through her list and we were unable to determine which one.  I told her it could be one that her PCP prescribed or it could be her abilify since it causes restlessness at time.   I also added John into this message as she mentioned his name.  She said she had told him that she could not take zofran as it makes her sick.   Please advise on what I should let her know.  I have sent in the refill for compazine for her.  Gardiner Rhyme, RN

## 2022-06-22 ENCOUNTER — Other Ambulatory Visit: Payer: Self-pay | Admitting: Radiation Oncology

## 2022-06-22 DIAGNOSIS — Z170 Estrogen receptor positive status [ER+]: Secondary | ICD-10-CM

## 2022-06-23 ENCOUNTER — Other Ambulatory Visit: Payer: Self-pay

## 2022-06-23 ENCOUNTER — Encounter: Payer: Medicaid Other | Admitting: Nurse Practitioner

## 2022-06-23 ENCOUNTER — Ambulatory Visit
Admission: RE | Admit: 2022-06-23 | Discharge: 2022-06-23 | Disposition: A | Discharge status: Home / Self Care | Payer: Medicaid Other | Source: Ambulatory Visit | Attending: Radiation Oncology | Admitting: Radiation Oncology

## 2022-06-23 ENCOUNTER — Telehealth: Payer: Self-pay

## 2022-06-23 DIAGNOSIS — C7951 Secondary malignant neoplasm of bone: Secondary | ICD-10-CM

## 2022-06-23 LAB — RAD ONC ARIA SESSION SUMMARY
Course Elapsed Days: 0
Plan Fractions Treated to Date: 1
Plan Prescribed Dose Per Fraction: 3 Gy
Plan Total Fractions Prescribed: 10
Plan Total Prescribed Dose: 30 Gy
Reference Point Dosage Given to Date: 3 Gy
Reference Point Session Dosage Given: 3 Gy
Session Number: 1

## 2022-06-23 NOTE — Therapy (Incomplete)
OUTPATIENT PHYSICAL THERAPY ONCOLOGY EVALUATION  Patient Name: Chelsea Hansen MRN: [id number] DOB:[birth year], 52 y.o., female Today's Date: 06/23/2022  END OF SESSION:   Past Medical History:  Diagnosis Date   Allergy    seasonal allergies   Anxiety    on meds   Asthma    uses inhaler   Breast cancer ([place]) 2012   RIGHT lumpectomy   Cancer ([place]) 2022   RIGHT breast lump-dx 2022   Depression    on meds   DVT (deep venous thrombosis) ([place]) 2010   after hysterectomy   Family history of uterine cancer    GERD (gastroesophageal reflux disease)    with certain foods/OTC PRN meds   Headache(784.0)    History of radiation therapy    Bilateral breast- 09/03/21-11/02/21- Dr. [name]   Hyperlipidemia    on meds   Hypertension    on meds   SVT (supraventricular tachycardia)    Past Surgical History:  Procedure Laterality Date   ABDOMINAL HYSTERECTOMY     AXILLARY SENTINEL NODE BIOPSY Left 07/09/2021   Procedure: LEFT AXILLARY SENTINEL NODE BIOPSY;  Surgeon: [name], MD;  Location: [facility];  Service: General;  Laterality: Left;   BREAST EXCISIONAL BIOPSY Right 09/16/2009   BREAST LUMPECTOMY WITH RADIOACTIVE SEED LOCALIZATION Bilateral 07/09/2021   Procedure: BILATERAL BREAST LUMPECTOMY WITH RADIOACTIVE SEED LOCALIZATION;  Surgeon: [name], MD;  Location: [facility];  Service: General;  Laterality: Bilateral;   BREAST SURGERY     lumpectomy   PORT-A-CATH REMOVAL Left 07/09/2021   Procedure: REMOVAL PORT-A-CATH;  Surgeon: [name], MD;  Location: [facility];  Service: General;  Laterality: Left;   PORTACATH PLACEMENT Left 01/26/2021   Procedure: INSERTION PORT-A-CATH;  Surgeon: [name], MD;  Location: [facility];  Service: General;  Laterality: Left;   RADIOACTIVE SEED GUIDED AXILLARY SENTINEL LYMPH NODE Right 07/09/2021   Procedure: RADIOACTIVE SEED GUIDED RIGHT AXILLARY SENTINEL LYMPH NODE  DISSECTION;  Surgeon: [name], MD;  Location: [facility];  Service: General;  Laterality: Right;   TUBAL LIGATION     Patient Active Problem List   Diagnosis Date Noted   Metastasis to bone ([place]) 05/20/2022   PSVT (paroxysmal supraventricular tachycardia) 07/08/2021   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   GERD (gastroesophageal reflux disease) 05/27/2021   Hyperlipidemia 05/27/2021   Hypertension 05/27/2021   Pain management contract signed 05/04/2021   Chronic pain syndrome (breast cancer) 05/04/2021   Palpitations 03/25/2021   Sinus bradycardia 03/25/2021   Daytime somnolence 03/25/2021   Snoring 03/25/2021   Obesity (BMI 30-39.9) 03/25/2021   Port-A-Cath in place 01/27/2021   Genetic testing 12/24/2020   Family history of uterine cancer 12/17/2020   Malignant neoplasm of upper-outer quadrant of right breast in female, estrogen receptor positive ([place]) 12/12/2020   Malignant neoplasm of upper-outer quadrant of left breast in female, estrogen receptor positive ([place]) 12/12/2020   Hypokalemia 01/02/2012   Asthma 01/01/2012   Bradycardia 01/01/2012   Syncope 12/31/2011   Mediastinal adenopathy 12/31/2011   Anemia 12/31/2011   Chest pain 12/31/2011    PCP: Juluis Mire  REFERRING PROVIDER: [name]  REFERRING DIAG: ***  THERAPY DIAG:  No diagnosis found.  ONSET DATE: 01/2021  Rationale for Evaluation and Treatment: Rehabilitation  SUBJECTIVE:  SUBJECTIVE STATEMENT:    PERTINENT HISTORY:  Neoadjuvant chemotherapy with Doxorubicin and Cyclophosphamide given x 4 beginning 01/27/2021 and completing on 03/10/2021 followed by weekly paclitaxel x 12 beginning 05/2021 07/09/2021: Right lumpectomy: Grade 2 invasive lobular cancer, 2.5 cm, LCIS, margins  negative, LCIS focally at anterior margin, 1/1 lymph node positive, ER 95%, PR 100%, HER2 negative, Ki-67 40% Left lumpectomy: High-grade DCIS: 0.7 cm, margins negative, 0/1 lymph node negative ER 95%, PR 100%. Pt now  STAGE IV Breast Cancer with bone mets throughout thoracic and lumbar regions and is pending radiation to her lumbar spine next week.    PAIN:  Are you having pain? {yes/no:20286} NPRS scale: ***/10 Pain location: *** Pain orientation: {Pain Orientation:25161}  PAIN TYPE: {type:313116} Pain description: {PAIN DESCRIPTION:21022940}  Aggravating factors: *** Relieving factors: ***  PRECAUTIONS: BONE METS currently having palliative radiation to spine, bilateral breast lymphedema, bilateral UE lymphedema risk  WEIGHT BEARING RESTRICTIONS: No  FALLS:  Has patient fallen in last 6 months? {fallsyesno:27318}  LIVING ENVIRONMENT: Lives with: {[facility] lives with:25569::"lives with their family"} Lives in: House/apartment Stairs: Yes; Internal: 1 flight steps; on left going up Has following equipment at home: Single point cane, Walker - 4 wheeled, shower chair, and bed side commode  OCCUPATION: mgr at [employer]  LEISURE: shop, dominoes  HAND DOMINANCE: right   PRIOR LEVEL OF FUNCTION: Independent  PATIENT GOALS: ***   OBJECTIVE:  COGNITION: Overall cognitive status: Within functional limits for tasks assessed   PALPATION: ***  OBSERVATIONS / OTHER ASSESSMENTS: bilateral breasts with enlarged pores  SENSATION: Light touch: {intact/deficits:24005}   POSTURE: forward head, rounded shoulders  UPPER EXTREMITY AROM/PROM:  A/PROM RIGHT   eval   Shoulder extension   Shoulder flexion   Shoulder abduction   Shoulder internal rotation   Shoulder external rotation     (Blank rows = not tested)  A/PROM LEFT   eval  Shoulder extension   Shoulder flexion   Shoulder abduction   Shoulder internal rotation   Shoulder external rotation     (Blank rows = not  tested)  CERVICAL AROM: All within functional limits:    LUMBAR ROM:    UPPER EXTREMITY STRENGTH: ***  LOWER EXTREMITY MMT: ***  LYMPHEDEMA ASSESSMENTS:   SURGERY TYPE/DATE: 07/09/2021 BILATERAL BREAST LUMPECTOMY WITH RADIOACTIVE SEED LOCALIZATION RADIOACTIVE SEED GUIDED RIGHT AXILLARY TARGETED LYMPH NODE DISSECTION LEFT AXILLARY SENTINEL NODE BIOPSY LEFT AXILLARY LYMPH NODE MAPPING WITH SENTIMAG    NUMBER OF LYMPH NODES REMOVED: Right 1/1, Left 0/1   CHEMOTHERAPY:  neoadjuvant    RADIATION:yes bilateral breast, presently having palliative radiation for bone METS to spine    HORMONE TREATMENT: YES  INFECTIONS: NO  LYMPHEDEMA ASSESSMENTS:     LANDMARK RIGHT  03/04/2022 Right 06/24/2022  10 cm proximal to olecranon process 30.5   Olecranon process 27.1   10 cm proximal to ulnar styloid process 21.2   Just proximal to ulnar styloid process 16.6   Across hand at thumb web space 21.4   At base of 2nd digit 6.8   (Blank rows = not tested)   LANDMARK LEFT  03/04/2022 LEFT 06/24/2022  10 cm proximal to olecranon process 30.7   Olecranon process 26.7   10 cm proximal to ulnar styloid process 20.5   Just proximal to ulnar styloid process 16.3   Across hand at thumb web space 21.1   At base of 2nd digit 6.7   (Blank rows = not tested)   FUNCTIONAL TESTS:  {Functional tests:24029}  GAIT: Distance walked: ***  Assistive device utilized: {Assistive devices:23999} Level of assistance: {Levels of assistance:24026} Comments: ***  QUICK DASH SURVEY: ***   TODAY'S TREATMENT:                                                                                                                                         DATE: Evaluation only  PATIENT EDUCATION:  Education details: *** Person educated: Patient Education method: Explanation Education comprehension: verbalized understanding  HOME EXERCISE PROGRAM: None given  ASSESSMENT:  CLINICAL IMPRESSION: Patient is a  52 y.o. female who was seen today for physical therapy evaluation and treatment for complications arising from bilateral breast surgeries for Breast Cancer, Radiation and new onset of bone Metastasis. She presents with complaints of Back pain and .  She continues to have bilateral breast swelling although improved from previously and continues with enlarged pores. She is still awaiting a Flexi touch Sequential Compression pump to assist with this. She has intermittent pain/tightness in bilateral shoulders/chest from effects of radiation  OBJECTIVE IMPAIRMENTS: {opptimpairments:25111}.   ACTIVITY LIMITATIONS: {activitylimitations:27494}  PARTICIPATION LIMITATIONS: {participationrestrictions:25113}  PERSONAL FACTORS: 3+ comorbidities: bilateral Breast Cancer s/p Radiation with new bone Metastasis and palliative radiation for pain  are also affecting patient's functional outcome.   REHAB POTENTIAL: Good  CLINICAL DECISION MAKING: Evolving/moderate complexity  EVALUATION COMPLEXITY: Low  GOALS: Goals reviewed with patient? Yes  SHORT TERM GOALS: Target date: 07/22/2022  Pt will be independent in a HEP for gentle lumbar/core strengthening Baseline: Goal status: INITIAL  2.  *** Baseline:  Goal status: {GOALSTATUS:25110}  LONG TERM GOALS: Target date: 2/29/2023  *** Baseline:  Goal status: {GOALSTATUS:25110}  2.  *** Baseline:  Goal status: {GOALSTATUS:25110}  3.  *** Baseline:  Goal status: {GOALSTATUS:25110}  4.  *** Baseline:  Goal status: {GOALSTATUS:25110}    PLAN:  PT FREQUENCY: {rehab frequency:25116}  PT DURATION: 8 weeks  PLANNED INTERVENTIONS: Therapeutic exercises, Therapeutic activity, Neuromuscular re-education, Balance training, Patient/Family education, Self Care, Orthotic/Fit training, Aquatic Therapy, Manual lymph drainage, Manual therapy, and Re-evaluation  PLAN FOR NEXT SESSION: ***   Claris Pong, PT 06/23/2022, 6:17 PM

## 2022-06-23 NOTE — Telephone Encounter (Signed)
Pt came to [facility] for radiation appt, however was not arrived to later appts with palliative. Will r/s.

## 2022-06-23 NOTE — Progress Notes (Deleted)
[facility]  [phone] Fax:(336) [phone]   Name: Chelsea Hansen Date: 06/23/2022 MRN: [id number]  DOB: Feb 09, 1971  Patient Care Team: [name], NP as PCP - General (Internal Medicine) [name], DO as PCP - Cardiology (Cardiology) [name], MD as Consulting Physician (Radiation Oncology) [name], MD as Consulting Physician (General Surgery) [name], MD as Consulting Physician (Hematology and Oncology)   INTERVAL HISTORY: Chelsea Hansen is a 52 y.o. 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  MRI of L-spine (12/23) showed L3 pathologic fracture and significant lesion along S1 with sclerotic changes.  Palliative ask to see for symptom management and goals of care.   SOCIAL HISTORY:     reports that she has never smoked. She has never used smokeless tobacco. She reports that she does not currently use alcohol after a past usage of about 7.0 standard drinks of alcohol per week. She reports that she does not use drugs.  ADVANCE DIRECTIVES:    CODE STATUS:   PAST MEDICAL HISTORY: Past Medical History:  Diagnosis Date   Allergy    seasonal allergies   Anxiety    on meds   Asthma    uses inhaler   Breast cancer ([place]) 2012   RIGHT lumpectomy   Cancer ([place]) 2022   RIGHT breast lump-dx 2022   Depression    on meds   DVT (deep venous thrombosis) ([place]) 2010   after hysterectomy   Family history of uterine cancer    GERD (gastroesophageal reflux disease)    with certain foods/OTC PRN meds   Headache(784.0)    History of radiation therapy    Bilateral breast- 09/03/21-11/02/21- Dr. [name]   Hyperlipidemia    on meds   Hypertension    on meds   SVT (supraventricular tachycardia)     ALLERGIES:  has No Known Allergies.  MEDICATIONS:  Current Outpatient Medications  Medication Sig Dispense Refill   abemaciclib (VERZENIO) 50 MG tablet Take 1 tablet (50 mg total) by mouth 2 (two) times daily. 60 tablet 3   albuterol (VENTOLIN HFA) 108 (90 Base) MCG/ACT inhaler Inhale 2 puffs by mouth into the lungs every 6 hours as needed for wheezing or shortness of breath. 18 g 2   amLODipine (NORVASC) 10 MG tablet Take 1 tablet  by mouth daily. 90 tablet 1   anastrozole (ARIMIDEX) 1 MG tablet Take 1 tablet (1 mg total) by mouth daily. 90 tablet 3   ARIPiprazole (ABILIFY) 15 MG tablet Take 1 tablet by mouth at bedtime 30 tablet 0   escitalopram (LEXAPRO) 20 MG tablet Take 1 tablet (20 mg total) by mouth daily. 90 tablet 1   hydrochlorothiazide (HYDRODIURIL) 25 MG tablet Take 1 tablet by mouth daily. 30 tablet 0   montelukast (SINGULAIR) 10 MG tablet Take 1 tablet (10 mg total) by mouth at bedtime. 90 tablet 3   oxyCODONE ER 9 MG C12A Take 1 capsule by mouth 2 times daily. 30 capsule 0   oxyCODONE-acetaminophen (PERCOCET) 7.5-325 MG tablet Take 1 - 2 tablets by mouth every 6 (six) hours as needed for severe pain. 120 tablet 0   pantoprazole (PROTONIX) 40 MG tablet Take 1 tablet (40 mg total) by mouth daily.  30 tablet 0   prochlorperazine (COMPAZINE) 10 MG tablet Take 1 tablet (10 mg total) by mouth every 6 (six) hours as needed for nausea or vomiting. 30 tablet 0   promethazine (PHENERGAN) 25 MG suppository Place 1 suppository (25 mg total) rectally every 6 (six) hours as needed for up to 3 days for nausea or vomiting. 12 suppository 0   propranolol (INDERAL) 10 MG tablet Take 1 tablet (10 mg total) by mouth 2 (two) times daily. 180 tablet 3   valsartan (DIOVAN) 40 MG tablet Take 1 tablet (40 mg total) by mouth daily. 90 tablet 3   No current facility-administered medications for this  visit.    VITAL SIGNS: There were no vitals taken for this visit. There were no vitals filed for this visit.  Estimated body mass index is 30.67 kg/m as calculated from the following:   Height as of 06/08/22: '5\' 11"'$  (1.803 m).   Weight as of 06/08/22: 219 lb 14.4 oz (99.7 kg).   PERFORMANCE STATUS (ECOG) : 1 - Symptomatic but completely ambulatory    IMPRESSION:  Neoplasm related pain   Constipation   PLAN: Continue Xtampza 9 mg every 12 hours Continue Percocet every 6 hours as needed for breakthrough pain MiraLAX twice daily for bowel regimen Patient reports significant improvement in pain.  No changes made.  Well-controlled. I will plan to see patient back in 2-3 weeks in collaboration with her other oncology appointments.  Patient knows to contact our office sooner if needed.   Patient expressed understanding and was in agreement with this plan. She also understands that She can call the clinic at any time with any questions, concerns, or complaints.   Any controlled substances utilized were prescribed in the context of palliative care. PDMP has been reviewed.   Time Total: 40 min   Visit consisted of counseling and education dealing with the complex and emotionally intense issues of symptom management and palliative care in the setting of serious and potentially life-threatening illness.Greater than 50%  of this time was spent counseling and coordinating care related to the above assessment and plan.  Alda Lea, AGPCNP-BC  Palliative Medicine Team/[facility]

## 2022-06-24 ENCOUNTER — Inpatient Hospital Stay: Payer: Medicaid Other | Admitting: Pharmacist

## 2022-06-24 ENCOUNTER — Inpatient Hospital Stay (HOSPITAL_BASED_OUTPATIENT_CLINIC_OR_DEPARTMENT_OTHER): Payer: Medicaid Other

## 2022-06-24 ENCOUNTER — Other Ambulatory Visit: Payer: Self-pay

## 2022-06-24 ENCOUNTER — Ambulatory Visit: Payer: Medicaid Other

## 2022-06-24 ENCOUNTER — Inpatient Hospital Stay: Payer: Medicaid Other | Admitting: Licensed Clinical Social Worker

## 2022-06-24 ENCOUNTER — Ambulatory Visit
Admission: RE | Admit: 2022-06-24 | Discharge: 2022-06-24 | Disposition: A | Discharge status: Home / Self Care | Payer: Medicaid Other | Source: Ambulatory Visit | Attending: Radiation Oncology | Admitting: Radiation Oncology

## 2022-06-24 ENCOUNTER — Encounter: Payer: Self-pay | Admitting: Nurse Practitioner

## 2022-06-24 ENCOUNTER — Inpatient Hospital Stay: Payer: Medicaid Other

## 2022-06-24 ENCOUNTER — Other Ambulatory Visit (HOSPITAL_COMMUNITY): Payer: Self-pay

## 2022-06-24 ENCOUNTER — Inpatient Hospital Stay (HOSPITAL_BASED_OUTPATIENT_CLINIC_OR_DEPARTMENT_OTHER): Payer: Medicaid Other | Admitting: Nurse Practitioner

## 2022-06-24 VITALS — BP 146/88 | HR 62 | Temp 97.5°F | Resp 17 | Wt 220.4 lb

## 2022-06-24 VITALS — BP 146/88 | HR 62 | Temp 97.5°F | Resp 17 | Wt 218.4 lb

## 2022-06-24 DIAGNOSIS — C50412 Malignant neoplasm of upper-outer quadrant of left female breast: Secondary | ICD-10-CM | POA: Insufficient documentation

## 2022-06-24 DIAGNOSIS — C50411 Malignant neoplasm of upper-outer quadrant of right female breast: Secondary | ICD-10-CM

## 2022-06-24 DIAGNOSIS — G893 Neoplasm related pain (acute) (chronic): Secondary | ICD-10-CM | POA: Insufficient documentation

## 2022-06-24 DIAGNOSIS — C7951 Secondary malignant neoplasm of bone: Secondary | ICD-10-CM

## 2022-06-24 DIAGNOSIS — Z170 Estrogen receptor positive status [ER+]: Secondary | ICD-10-CM

## 2022-06-24 DIAGNOSIS — K5900 Constipation, unspecified: Secondary | ICD-10-CM | POA: Insufficient documentation

## 2022-06-24 DIAGNOSIS — Z515 Encounter for palliative care: Secondary | ICD-10-CM

## 2022-06-24 DIAGNOSIS — R110 Nausea: Secondary | ICD-10-CM | POA: Diagnosis not present

## 2022-06-24 DIAGNOSIS — R530 Neoplastic (malignant) related fatigue: Secondary | ICD-10-CM

## 2022-06-24 DIAGNOSIS — R112 Nausea with vomiting, unspecified: Secondary | ICD-10-CM | POA: Insufficient documentation

## 2022-06-24 LAB — RAD ONC ARIA SESSION SUMMARY
Course Elapsed Days: 1
Plan Fractions Treated to Date: 2
Plan Prescribed Dose Per Fraction: 3 Gy
Plan Total Fractions Prescribed: 10
Plan Total Prescribed Dose: 30 Gy
Reference Point Dosage Given to Date: 6 Gy
Reference Point Session Dosage Given: 3 Gy
Session Number: 2

## 2022-06-24 LAB — CMP (CANCER CENTER ONLY)
ALT: 12 U/L (ref 0–44)
AST: 14 U/L — ABNORMAL LOW (ref 15–41)
Albumin: 4 g/dL (ref 3.5–5.0)
Alkaline Phosphatase: 110 U/L (ref 38–126)
Anion gap: 6 (ref 5–15)
BUN: 12 mg/dL (ref 6–20)
CO2: 29 mmol/L (ref 22–32)
Calcium: 8.4 mg/dL — ABNORMAL LOW (ref 8.9–10.3)
Chloride: 104 mmol/L (ref 98–111)
Creatinine: 1.01 mg/dL — ABNORMAL HIGH (ref 0.44–1.00)
GFR, Estimated: >60 mL/min (ref >60)
Glucose, Bld: 99 mg/dL (ref 70–99)
Potassium: 3.6 mmol/L (ref 3.5–5.1)
Sodium: 139 mmol/L (ref 135–145)
Total Bilirubin: 0.4 mg/dL (ref 0.3–1.2)
Total Protein: 6.7 g/dL (ref 6.5–8.1)

## 2022-06-24 LAB — CBC WITH DIFFERENTIAL (CANCER CENTER ONLY)
Abs Immature Granulocytes: 0.01 10*3/uL (ref 0.00–0.07)
Basophils Absolute: 0 10*3/uL (ref 0.0–0.1)
Basophils Relative: 1 %
Eosinophils Absolute: 0.1 10*3/uL (ref 0.0–0.5)
Eosinophils Relative: 2 %
HCT: 33.1 % — ABNORMAL LOW (ref 36.0–46.0)
Hemoglobin: 10.7 g/dL — ABNORMAL LOW (ref 12.0–15.0)
Immature Granulocytes: 0 %
Lymphocytes Relative: 38 %
Lymphs Abs: 1.3 10*3/uL (ref 0.7–4.0)
MCH: 24.2 pg — ABNORMAL LOW (ref 26.0–34.0)
MCHC: 32.3 g/dL (ref 30.0–36.0)
MCV: 74.7 fL — ABNORMAL LOW (ref 80.0–100.0)
Monocytes Absolute: 0.3 10*3/uL (ref 0.1–1.0)
Monocytes Relative: 9 %
Neutro Abs: 1.6 10*3/uL — ABNORMAL LOW (ref 1.7–7.7)
Neutrophils Relative %: 50 %
Platelet Count: 293 10*3/uL (ref 150–400)
RBC: 4.43 MIL/uL (ref 3.87–5.11)
RDW: 15.4 % (ref 11.5–15.5)
WBC Count: 3.3 10*3/uL — ABNORMAL LOW (ref 4.0–10.5)
nRBC: 0 % (ref 0.0–0.2)

## 2022-06-24 MED ORDER — SODIUM CHLORIDE 0.9 % IV SOLN: INTRAVENOUS | Status: DC

## 2022-06-24 MED ORDER — SODIUM CHLORIDE 0.9 % IV SOLN: 8.0000 mg | Freq: Once | INTRAVENOUS | Status: DC

## 2022-06-24 MED ORDER — FAMOTIDINE IN NACL 20-0.9 MG/50ML-% IV SOLN
20.0000 mg | Freq: Once | INTRAVENOUS | Status: AC
  Administered 2022-06-24: 20 mg via INTRAVENOUS

## 2022-06-24 MED ORDER — OXYCODONE-ACETAMINOPHEN 7.5-325 MG PO TABS
1.0000 | ORAL_TABLET | Freq: Four times a day (QID) | ORAL | 0 refills | Status: DC | PRN
  Filled 2022-06-24: qty 102, 13d supply, fill #0

## 2022-06-24 MED ORDER — ONDANSETRON HCL 4 MG/2ML IJ SOLN
8.0000 mg | Freq: Once | INTRAMUSCULAR | Status: AC
  Administered 2022-06-24: 8 mg via INTRAVENOUS

## 2022-06-24 MED ORDER — OYSTER SHELL CALCIUM/D3 500-5 MG-MCG PO TABS
1.0000 | ORAL_TABLET | Freq: Every day | ORAL | 2 refills | Status: DC
  Filled 2022-06-24 – 2022-09-16 (×3): qty 120, 120d supply, fill #0
  Filled 2023-05-12: qty 90, 90d supply, fill #0

## 2022-06-24 MED ORDER — OXYCODONE ER 9 MG PO C12A
9.0000 mg | EXTENDED_RELEASE_CAPSULE | Freq: Two times a day (BID) | ORAL | 0 refills | Status: DC
  Filled 2022-06-24: qty 30, 15d supply, fill #0

## 2022-06-24 NOTE — Progress Notes (Signed)
[facility]  Telephone:(336) [phone] Fax:(336) [phone]   Name: Chelsea Hansen Date: 06/24/2022 MRN: [id number]  DOB: 1971-02-20  Patient Care Team: [name], NP as PCP - General (Internal Medicine) [name], DO as PCP - Cardiology (Cardiology) [name], MD as Consulting Physician (Radiation Oncology) [name], MD as Consulting Physician (General Surgery) [name], MD as Consulting Physician (Hematology and Oncology)   INTERVAL HISTORY: Chelsea Hansen is a 52 y.o. 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  MRI of L-spine (12/23) showed L3 pathologic fracture and significant lesion along S1 with sclerotic changes.  Palliative ask to see for symptom management and goals of care.   SOCIAL HISTORY:     reports that she has never smoked. She has never used smokeless tobacco. She reports that she does not currently use alcohol after a past usage of about 7.0 standard drinks of alcohol per week. She reports that she does not use drugs.  ADVANCE DIRECTIVES:    CODE STATUS:   PAST MEDICAL HISTORY: Past Medical History:  Diagnosis Date   Allergy    seasonal allergies   Anxiety    on meds   Asthma    uses inhaler   Breast cancer ([place]) 2012   RIGHT lumpectomy   Cancer ([place]) 2022   RIGHT breast lump-dx 2022   Depression    on meds   DVT (deep venous thrombosis) ([place]) 2010   after hysterectomy   Family history of uterine cancer    GERD (gastroesophageal reflux disease)    with certain foods/OTC PRN meds   Headache(784.0)    History of radiation therapy    Bilateral breast- 09/03/21-11/02/21- Dr. [name]   Hyperlipidemia    on meds   Hypertension    on meds   SVT (supraventricular tachycardia)     ALLERGIES:  has No Known Allergies.  MEDICATIONS:  Current Outpatient Medications  Medication Sig Dispense Refill   abemaciclib (VERZENIO) 50 MG tablet Take 1 tablet (50 mg total) by mouth 2 (two) times daily. 60 tablet 3   albuterol (VENTOLIN HFA) 108 (90 Base) MCG/ACT inhaler Inhale 2 puffs by mouth into the lungs every 6 hours as needed for wheezing or shortness of breath. 18 g 2   amLODipine (NORVASC) 10 MG tablet Take 1 tablet  by mouth daily. 90 tablet 1   anastrozole (ARIMIDEX) 1 MG tablet Take 1 tablet (1 mg total) by mouth daily. 90 tablet 3   ARIPiprazole (ABILIFY) 15 MG tablet Take 1 tablet by mouth at bedtime 30 tablet 0   calcium-vitamin D (OSCAL WITH D) 500-5 MG-MCG tablet Take 1 tablet by mouth daily with supper. 90 tablet 2   escitalopram (LEXAPRO) 20 MG tablet Take 1 tablet (20 mg total) by mouth daily. 90 tablet 1   hydrochlorothiazide (HYDRODIURIL) 25 MG tablet Take 1 tablet by mouth daily. 30 tablet 0   montelukast (SINGULAIR) 10 MG tablet Take 1 tablet (10 mg total) by mouth at bedtime. 90 tablet 3   oxyCODONE ER 9 MG C12A Take 1 capsule by mouth 2 times daily. 30 capsule 0   oxyCODONE-acetaminophen (PERCOCET) 7.5-325 MG tablet Take 1 - 2 tablets by mouth every 6 (six) hours as needed for  severe pain. 120 tablet 0   pantoprazole (PROTONIX) 40 MG tablet Take 1 tablet (40 mg total) by mouth daily. 30 tablet 0   prochlorperazine (COMPAZINE) 10 MG tablet Take 1 tablet (10 mg total) by mouth every 6 (six) hours as needed for nausea or vomiting. 30 tablet 0   promethazine (PHENERGAN) 25 MG suppository Place 1 suppository (25 mg total) rectally every 6 (six) hours as needed for up to 3 days for nausea or vomiting. 12 suppository 0   propranolol (INDERAL) 10 MG tablet Take 1 tablet (10 mg total) by mouth 2 (two) times daily. 180 tablet 3   valsartan (DIOVAN) 40 MG tablet Take  1 tablet (40 mg total) by mouth daily. 90 tablet 3   No current facility-administered medications for this visit.    VITAL SIGNS: BP (!) 146/88 (Patient Position: Sitting) Comment: copied from previous vitals  Pulse 62 Comment: taken from previous vitals  Temp (!) 97.5 F (36.4 C) (Temporal) Comment: taken from previous vitals  Resp 17 Comment: taken from previous vitals  Wt 220 lb 6.4 oz (100 kg)   SpO2 100% Comment: taken from previous vitals  BMI 30.74 kg/m  Filed Weights   06/24/22 1021  Weight: 220 lb 6.4 oz (100 kg)    Estimated body mass index is 30.74 kg/m as calculated from the following:   Height as of 06/08/22: '5\' 11"'$  (1.803 m).   Weight as of this encounter: 220 lb 6.4 oz (100 kg).   PERFORMANCE STATUS (ECOG) : 1 - Symptomatic but completely ambulatory   IMPRESSION: Chelsea Hansen presents to the clinic today for symptom management follow-up. Unfortunately she has not been feeling well. Complaining of ongoing nausea and vomiting. Her appetite has been fine. She started radiation this week and unsure if this is the cause of her fatigue versus medication.   Neoplasm related pain Chelsea Hansen reports her pain has been controlled on current regimen however she has not taken her Xtampza today due to nausea and busy schedule. Reports pain is in her back and shoulder. Throbbing, aching, sharp. Controlled with her medication. Increases pending level of activity.   We discussed at length her current regimen.  She is taking Xtampza 9 mg every 12 hours and Percocet every 6 hours as needed for breakthrough pain.  She understands we will continue with close follow-up maintaining current regimen given pain being well-controlled.  She knows to contact our office sooner if needed.  No changes will be made today. Advised to take medication with food to see if this helps with her nausea.   Nausea/Vomiting  During my assessment she begins to vomit with several episodes after while trying to sip on  ginger ale. She reports this is how she has been at home also. Education provided on taking medications with food and use of anti-emetic.   Given ongoing nausea we will give 1L IVF today with IV zofran for support. She has a prescription for compazine which she plans to pick up today.   Constipation Encourage patient to continue with MiraLAX twice daily.  Last bowel movement on yesterday. Education provided on having daily bowel movements with absence of constipation.  PLAN: Continue Xtampza 9 mg every 12 hours. Take with food.  Continue Percocet every 6 hours as needed for breakthrough pain MiraLAX twice daily for bowel regimen Patient reports pain is well-controlled. I will plan to see patient back in 2-3 weeks in collaboration with her other oncology appointments. Will plan to contact by phone next  week for close follow-up. Patient knows to contact our office sooner if needed.   Patient expressed understanding and was in agreement with this plan. She also understands that She can call the clinic at any time with any questions, concerns, or complaints.     Any controlled substances utilized were prescribed in the context of palliative care. PDMP has been reviewed.    Time Total: 45 min   Visit consisted of counseling and education dealing with the complex and emotionally intense issues of symptom management and palliative care in the setting of serious and potentially life-threatening illness.Greater than 50%  of this time was spent counseling and coordinating care related to the above assessment and plan.  Alda Lea, AGPCNP-BC  Palliative Medicine Team/[facility]

## 2022-06-24 NOTE — Progress Notes (Signed)
[facility] CSW Progress Note  Holiday representative met with patient per pt request. Provided bag of food from [employer]. Pt has been contacted by [facility] and will be working with Cecille Rubin on disability application.  Pt had questions about [employer] application. CSW provided paper copy and reviewed what supporting documents are needed. Pt will work on gathering them and contact CSW to complete and submit application.    Stacey Maura E Elynn Patteson, LCSW

## 2022-06-24 NOTE — Progress Notes (Signed)
[place]       Telephone: [phone]?Fax: [phone]   Oncology Clinical Pharmacist Practitioner Progress Note  Chelsea Hansen was contacted via in-person to discuss her chemotherapy regimen for abemaciclib which they receive under the care of Dr. [name].  Current treatment regimen and start date Abemaciclib (05/21/22) Anastrozole (03/22/22) Denosumab 120 mg (06/08/22)  Interval History She continues on abemaciclib 50 mg by mouth every 12 hours on days 1 to 28 of a 28-day cycle. This is being given in combination with anastrozole and denosumab . Therapy is planned to continue until disease progression or unacceptable toxicity.  Chelsea Hansen was seen today by clinical pharmacy as a follow-up to her abemaciclib management.  She was last seen by clinical pharmacy on 06/08/22 and Dr. Lindi Adie on 06/03/22.  She had contacted Dr. Geralyn Flash office last week reporting some vomiting episodes while in [place].    Response to Therapy Chelsea Hansen states that her nausea and vomiting have improved.  She feels that the nausea and vomiting was due to the oxycodone extended release tablets.  Specifically the morning tablet per her report.  She has stopped taking this tablet and she will be discussing today with the palliative care team some other options.  Chelsea Hansen states that the evening extended release oxycodone tablet makes her awake and causes no issues.  Also, the oxycodone-acetaminophen immediate release tablets is causing no side effects per her report.  We did discuss today that pain medications in this class can cause constipation and she does report not having regular bowel movements.  We have recommended over-the-counter senna-S as needed and she could also consider over-the-counter MiraLAX as needed.  She is not reporting any diarrhea at this time.  She verbalized understanding of the plan.  Her calcium was slightly low today and clinical pharmacy has sent a calcium/vitamin D  supplement to the [facility] which is her pharmacy of choice.  Have also given her written information on calcium rich foods which she will consider.  Chelsea Hansen had also requested to see social services today.  They came to meet her after our visit to discuss some questions that Chelsea Hansen had.  Appreciate their assistance.  We again reviewed the signs and symptoms of pneumonitis and VTE.  After speaking with Dr. Lindi Adie, we will order restaging scans for early March and then she will see our nurse practitioner Thedore Mins the following week to review those scans, and she will also be due next for denosumab 120 mg.  Clinical pharmacy will see her again in 2 weeks labs.. Labs, vitals, treatment parameters, and manufacturer guidelines assessing toxicity were reviewed with Leighton Parody today. Based on these values, patient is in agreement to continue abemaciclib therapy at this time.  Allergies No Known Allergies  Vitals    06/24/2022    9:07 AM 06/08/2022   11:01 AM 06/07/2022   11:05 AM  Oncology Vitals  Height  180 cm 180 cm  Weight 99.054 kg 99.746 kg 99.882 kg  Weight (lbs) 218 lbs 6 oz 219 lbs 14 oz 220 lbs 3 oz  BMI 30.46 kg/m2   30.46 kg/m2 30.67 kg/m2   30.67 kg/m2 30.71 kg/m2   30.71 kg/m2  Temp 97.5 F (36.4 C) 98.1 F (36.7 C) 97.9 F (36.6 C)  Pulse Rate 62 74 66  BP 146/88 146/84 168/90  Resp '17 18 20  '$ SpO2 100 % 99 % 100 %  BSA (m2) 2.23  m2   2.23 m2 2.23 m2   2.23 m2 2.24 m2   2.24 m2    Laboratory Data    Latest Ref Rng & Units 06/24/2022    8:32 AM 06/08/2022   10:41 AM 05/26/2022    2:05 PM  CBC EXTENDED  WBC 4.0 - 10.5 K/uL 3.3  4.9  5.0   RBC 3.87 - 5.11 MIL/uL 4.43  4.60  4.79   Hemoglobin 12.0 - 15.0 g/dL 10.7  11.2  11.5   HCT 36.0 - 46.0 % 33.1  34.2  36.3   Platelets 150 - 400 K/uL 293  246  307   NEUT# 1.7 - 7.7 K/uL 1.6  3.3  3.3   Lymph# 0.7 - 4.0 K/uL 1.3  1.2  1.2        Latest Ref Rng & Units 06/24/2022    8:32 AM  06/08/2022   10:41 AM 05/26/2022    2:05 PM  CMP  Glucose 70 - 99 mg/dL 99  93  65   BUN 6 - 20 mg/dL '12  15  16   '$ Creatinine 0.44 - 1.00 mg/dL 1.01  1.06  1.08   Sodium 135 - 145 mmol/L 139  141  142   Potassium 3.5 - 5.1 mmol/L 3.6  3.4  4.1   Chloride 98 - 111 mmol/L 104  105  106   CO2 22 - 32 mmol/L '29  26  28   '$ Calcium 8.9 - 10.3 mg/dL 8.4  9.5  9.2   Total Protein 6.5 - 8.1 g/dL 6.7  8.0  7.5   Total Bilirubin 0.3 - 1.2 mg/dL 0.4  0.6  0.4   Alkaline Phos 38 - 126 U/L 110  109  89   AST 15 - 41 U/L '14  19  16   '$ ALT 0 - 44 U/L '12  20  19     '$ Adverse Effects Assessment Nausea/vomiting: Chelsea Hansen has stopped taking her morning extended release pain medication which she feels has helped.  Because of this, the nausea and vomiting that she was experiencing is likely not due to abemaciclib.  Will continue to monitor. Low calcium: She will be started on a calcium/vitamin D supplement today.  We also discussed and gave her written handouts of calcium rich foods.  She is asymptomatic at this time. Constipation: We discussed starting over-the-counter senna-S as needed and MiraLAX over-the-counter as needed.  Will continue to closely monitor Serum creatinine elevation: Slightly decreased from last visit.  We again discussed the importance of drinking plenty of fluids.  Adherence Assessment [name] reports missing 0 doses over the past 2 weeks.   Reason for missed dose: N/A Patient was re-educated on importance of adherence.   Access Assessment [name] is currently receiving her abemaciclib through [facility] concerns: None  Medication Reconciliation The patient's medication list was reviewed today with the patient?  This New medications or herbal supplements have recently been started?  No Any medications have been discontinued?  No The medication list was updated and reconciled based on the patient's most recent medication list in the  electronic medical record (EMR) including herbal products and OTC medications.   Medications Current Outpatient Medications  Medication Sig Dispense Refill   calcium-vitamin D (OSCAL WITH D) 500-5 MG-MCG tablet Take 1 tablet by mouth daily with supper. 90 tablet 2   abemaciclib (VERZENIO) 50 MG tablet Take 1 tablet (50 mg total) by mouth 2 (two) times  daily. 60 tablet 3   albuterol (VENTOLIN HFA) 108 (90 Base) MCG/ACT inhaler Inhale 2 puffs by mouth into the lungs every 6 hours as needed for wheezing or shortness of breath. 18 g 2   amLODipine (NORVASC) 10 MG tablet Take 1 tablet  by mouth daily. 90 tablet 1   anastrozole (ARIMIDEX) 1 MG tablet Take 1 tablet (1 mg total) by mouth daily. 90 tablet 3   ARIPiprazole (ABILIFY) 15 MG tablet Take 1 tablet by mouth at bedtime 30 tablet 0   escitalopram (LEXAPRO) 20 MG tablet Take 1 tablet (20 mg total) by mouth daily. 90 tablet 1   hydrochlorothiazide (HYDRODIURIL) 25 MG tablet Take 1 tablet by mouth daily. 30 tablet 0   montelukast (SINGULAIR) 10 MG tablet Take 1 tablet (10 mg total) by mouth at bedtime. 90 tablet 3   oxyCODONE ER 9 MG C12A Take 1 capsule by mouth 2 times daily. 30 capsule 0   oxyCODONE-acetaminophen (PERCOCET) 7.5-325 MG tablet Take 1 - 2 tablets by mouth every 6 (six) hours as needed for severe pain. 120 tablet 0   pantoprazole (PROTONIX) 40 MG tablet Take 1 tablet (40 mg total) by mouth daily. 30 tablet 0   prochlorperazine (COMPAZINE) 10 MG tablet Take 1 tablet (10 mg total) by mouth every 6 (six) hours as needed for nausea or vomiting. 30 tablet 0   promethazine (PHENERGAN) 25 MG suppository Place 1 suppository (25 mg total) rectally every 6 (six) hours as needed for up to 3 days for nausea or vomiting. 12 suppository 0   propranolol (INDERAL) 10 MG tablet Take 1 tablet (10 mg total) by mouth 2 (two) times daily. 180 tablet 3   valsartan (DIOVAN) 40 MG tablet Take 1 tablet (40 mg total) by mouth daily. 90 tablet 3   No current  facility-administered medications for this visit.    Drug-Drug Interactions (DDIs) DDIs were evaluated?  Yes Significant DDIs?  No The patient was instructed to speak with their health care provider and/or the oral chemotherapy pharmacist before starting any new drug, including prescription or over the counter, natural / herbal products, or vitamins.  Supportive Care Diarrhea: we reviewed that diarrhea is common with abemaciclib and confirmed that she does have loperamide (Imodium) at home.  We reviewed how to take this medication PRN. Neutropenia: we discussed the importance of having a thermometer and what the Centers for Disease Control and Prevention (CDC) considers a fever which is 100.58F (38C) or higher.  Gave patient 24/7 triage line to call if any fevers or symptoms. ILD/Pneumonitis: we reviewed potential symptoms including cough, shortness, and fatigue.  VTE: reviewed signs of DVT such as leg swelling, redness, pain, or tenderness and signs of PE such as shortness of breath, rapid or irregular heartbeat, cough, chest pain, or lightheadedness. Reviewed to take the medication every 12 hours (with food sometimes can be easier on the stomach) and to take it at the same time every day. Constipation: She will consider starting senna-S and MiraLAX over-the-counter as needed.  Will add this to her medication list if applicable next visit.  Dosing Assessment Hepatic adjustments needed?  No Renal adjustments needed?  No Toxicity adjustments needed?  No The current dosing regimen is appropriate to continue at this time.  Follow-Up Plan Continue abemaciclib 50 mg by mouth every 12 hours Continue anastrozole 1 mg by mouth daily Start calcium/vitamin D supplement.  Prescription has been sent to [facility] outpatient pharmacy. Written documention given to patient on calcium rich  foods. Continue to follow with palliative care team for management of pain medications.  Appreciate their  recommendations. Continue to monitor [place], serum creatinine, calcium, and hemoglobin Restaging scans ordered for March per Dr. Lindi Adie We will move the lab, Thedore Mins visit from 08/23/22 to 08/31/22.  This will coincide with her next denosumab 120 mg injection which we will order. Labs, pharmacy clinic visit, in 2 weeks.  Leighton Parody participated in the discussion, expressed understanding, and voiced agreement with the above plan. All questions were answered to her satisfaction. The patient was advised to contact the clinic at [phone] with any questions or concerns prior to her return visit.   I spent 30 minutes assessing and educating the patient.  Raina Mina, RPH-CPP, 06/24/2022  9:49 AM   **Disclaimer: This note was dictated with voice recognition software. Similar sounding words can inadvertently be transcribed and this note may contain transcription errors which may not have been corrected upon publication of note.**

## 2022-06-25 ENCOUNTER — Other Ambulatory Visit: Payer: Self-pay

## 2022-06-25 ENCOUNTER — Ambulatory Visit
Admission: RE | Admit: 2022-06-25 | Discharge: 2022-06-25 | Disposition: A | Discharge status: Home / Self Care | Payer: Medicaid Other | Source: Ambulatory Visit | Attending: Radiation Oncology | Admitting: Radiation Oncology

## 2022-06-25 ENCOUNTER — Ambulatory Visit: Payer: Medicaid Other | Attending: Radiation Oncology

## 2022-06-25 DIAGNOSIS — R293 Abnormal posture: Secondary | ICD-10-CM | POA: Insufficient documentation

## 2022-06-25 DIAGNOSIS — C7951 Secondary malignant neoplasm of bone: Secondary | ICD-10-CM | POA: Insufficient documentation

## 2022-06-25 DIAGNOSIS — I890 Lymphedema, not elsewhere classified: Secondary | ICD-10-CM | POA: Insufficient documentation

## 2022-06-25 DIAGNOSIS — G893 Neoplasm related pain (acute) (chronic): Secondary | ICD-10-CM | POA: Insufficient documentation

## 2022-06-25 DIAGNOSIS — C50411 Malignant neoplasm of upper-outer quadrant of right female breast: Secondary | ICD-10-CM | POA: Diagnosis not present

## 2022-06-25 DIAGNOSIS — C50412 Malignant neoplasm of upper-outer quadrant of left female breast: Secondary | ICD-10-CM | POA: Diagnosis present

## 2022-06-25 DIAGNOSIS — Z170 Estrogen receptor positive status [ER+]: Secondary | ICD-10-CM | POA: Diagnosis present

## 2022-06-25 DIAGNOSIS — R209 Unspecified disturbances of skin sensation: Secondary | ICD-10-CM | POA: Insufficient documentation

## 2022-06-25 LAB — RAD ONC ARIA SESSION SUMMARY
Course Elapsed Days: 2
Plan Fractions Treated to Date: 3
Plan Prescribed Dose Per Fraction: 3 Gy
Plan Total Fractions Prescribed: 10
Plan Total Prescribed Dose: 30 Gy
Reference Point Dosage Given to Date: 9 Gy
Reference Point Session Dosage Given: 3 Gy
Session Number: 3

## 2022-06-25 NOTE — Therapy (Signed)
OUTPATIENT PHYSICAL THERAPY ONCOLOGY EVALUATION  Patient Name: Chelsea Hansen MRN: [id number] DOB:April 06, 1971, 52 y.o., female Today's Date: 06/25/2022  END OF SESSION:  PT End of Session - 06/25/22 1052     Visit Number 1    Number of Visits 16    Date for PT Re-Evaluation 08/20/22    Authorization Type Medicaid    PT Start Time 1005    PT Stop Time 1052    PT Time Calculation (min) 47 min    Activity Tolerance Patient tolerated treatment well    Behavior During Therapy WFL for tasks assessed/performed             Past Medical History:  Diagnosis Date   Allergy    seasonal allergies   Anxiety    on meds   Asthma    uses inhaler   Breast cancer ([place]) 2012   RIGHT lumpectomy   Cancer ([place]) 2022   RIGHT breast lump-dx 2022   Depression    on meds   DVT (deep venous thrombosis) ([place]) 2010   after hysterectomy   Family history of uterine cancer    GERD (gastroesophageal reflux disease)    with certain foods/OTC PRN meds   Headache(784.0)    History of radiation therapy    Bilateral breast- 09/03/21-11/02/21- Dr. [name]   Hyperlipidemia    on meds   Hypertension    on meds   SVT (supraventricular tachycardia)    Past Surgical History:  Procedure Laterality Date   ABDOMINAL HYSTERECTOMY     AXILLARY SENTINEL NODE BIOPSY Left 07/09/2021   Procedure: LEFT AXILLARY SENTINEL NODE BIOPSY;  Surgeon: [name], MD;  Location: [place];  Service: General;  Laterality: Left;   BREAST EXCISIONAL BIOPSY Right 09/16/2009   BREAST LUMPECTOMY WITH RADIOACTIVE SEED LOCALIZATION Bilateral 07/09/2021   Procedure: BILATERAL BREAST LUMPECTOMY WITH RADIOACTIVE SEED LOCALIZATION;  Surgeon: [name], MD;  Location: [place];  Service: General;  Laterality: Bilateral;   BREAST SURGERY     lumpectomy   PORT-A-CATH REMOVAL Left 07/09/2021   Procedure: REMOVAL PORT-A-CATH;  Surgeon: [name], MD;  Location: [place];  Service: General;  Laterality: Left;   PORTACATH PLACEMENT Left 01/26/2021   Procedure: INSERTION PORT-A-CATH;  Surgeon: [name], MD;  Location: [facility];  Service: General;  Laterality: Left;   RADIOACTIVE SEED GUIDED AXILLARY SENTINEL LYMPH NODE Right 07/09/2021   Procedure: RADIOACTIVE SEED GUIDED RIGHT AXILLARY SENTINEL LYMPH NODE DISSECTION;  Surgeon: [name], MD;  Location: [place];  Service: General;  Laterality: Right;   TUBAL LIGATION     Patient Active Problem List   Diagnosis Date Noted   Metastasis to bone ([place]) 05/20/2022   PSVT (paroxysmal supraventricular tachycardia) 07/08/2021   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   GERD (gastroesophageal reflux disease) 05/27/2021   Hyperlipidemia 05/27/2021   Hypertension 05/27/2021   Pain management contract signed 05/04/2021   Chronic pain syndrome (breast cancer) 05/04/2021   Palpitations 03/25/2021   Sinus bradycardia 03/25/2021   Daytime somnolence 03/25/2021   Snoring 03/25/2021   Obesity (BMI 30-39.9) 03/25/2021   Port-A-Cath in place 01/27/2021   Genetic testing 12/24/2020   Family history of uterine cancer 12/17/2020   Malignant neoplasm of upper-outer quadrant of right breast in female, estrogen receptor positive ([place]) 12/12/2020   Malignant neoplasm of upper-outer quadrant of left breast in female, estrogen receptor positive ([place]) 12/12/2020   Hypokalemia 01/02/2012   Asthma 01/01/2012  Bradycardia 01/01/2012   Syncope 12/31/2011   Mediastinal adenopathy 12/31/2011   Anemia 12/31/2011   Chest pain 12/31/2011    PCP: Juluis Mire  REFERRING PROVIDER: [name]  REFERRING DIAG: Malignant neoplasm of upper-outer quadrant of right breast in female, estrogen receptor positive ([place])  Malignant neoplasm of upper-outer quadrant of left breast in female, estrogen receptor positive ([place])  Lymphedema, not elsewhere classified  Metastasis to bone  [facility])  Neoplasm related pain (acute) (chronic)  THERAPY DIAG:  Malignant neoplasm of upper-outer quadrant of right breast in female, estrogen receptor positive ([place])  Malignant neoplasm of upper-outer quadrant of left breast in female, estrogen receptor positive ([place])  Lymphedema, not elsewhere classified  Metastasis to bone [facility])  Neoplasm related pain (acute) (chronic)  ONSET DATE: 01/2021  Rationale for Evaluation and Treatment: Rehabilitation  SUBJECTIVE:                                                                                                                                                                                           SUBJECTIVE STATEMENT: Pt reports pain in her LB,, bilateral hips and intermittently in the ribs and upper back from bone Metastasis.   Pt had her second palliative radiation treatment yesterday to her L3 area of metastatic fx.  She gets very fatigued after her treatments.  Breast swelling is improved but is up and down. She wears her compression bra especially with working.  I have been keeping up with the breast MLD and doing my exercises for my shoulders.  I feel limited with going up and down stairs with my laundry, standing for 30 min or more is very painful and I have to stand a lot at work, housework is difficult and I have to sit for 30 min. Grocery shopping takes me longer.  My sleep is disturbed a lot. Getting in and out of bed is hard.    PERTINENT HISTORY:  Neoadjuvant chemotherapy with Doxorubicin and Cyclophosphamide given x 4 beginning 01/27/2021 and completing on 03/10/2021 followed by weekly paclitaxel x 12 beginning 05/2021 07/09/2021: Right lumpectomy: Grade 2 invasive lobular cancer, 2.5 cm, LCIS, margins negative, LCIS focally at anterior margin, 1/1 lymph node positive, ER 95%, PR 100%, HER2 negative, Ki-67 40% Left lumpectomy: High-grade DCIS: 0.7 cm, margins negative, 0/1 lymph node negative ER 95%, PR 100%. Pt now  STAGE IV Breast  Cancer with bone mets throughout thoracic and lumbar regions and is pending radiation to her lumbar spine next week.    PAIN:  Are you having pain? Yes NPRS scale: 8/10 Pain location: LB/hips and left ribs Pain orientation: Bilateral  PAIN TYPE: aching, sharp, throbbing,  and tight Pain description: constant  Aggravating factors: standing, sleeping, walking, stairs, housework Relieving factors: Pain meds,changing positions,  PRECAUTIONS: BONE METS currently having palliative radiation to spine, bilateral breast lymphedema, bilateral UE lymphedema risk  WEIGHT BEARING RESTRICTIONS: No  FALLS:  Has patient fallen in last 6 months? No  LIVING ENVIRONMENT: Lives with: lives with their family and lives with an adult companion Lives in: House/apartment Stairs: Yes; Internal: 1 flight steps; on left going up Has following equipment at home: Single point cane, Walker - 4 wheeled, shower chair, and bed side commode  OCCUPATION: mgr at [employer]  LEISURE: shop, dominoes  HAND DOMINANCE: right   PRIOR LEVEL OF FUNCTION: Independent  PATIENT GOALS:   Get back to life, keep breast swelling down, learn exercises to strengthen my back/core,keep shoulder ROM on the right track   OBJECTIVE:  COGNITION: Overall cognitive status: Within functional limits for tasks assessed   PALPATION:   OBSERVATIONS / OTHER ASSESSMENTS: bilateral breasts with enlarged pores but improved since prior treatment session  SENSATION: Light touch: Appears intact   POSTURE: forward head, rounded shoulders  UPPER EXTREMITY AROM/PROM:  A/PROM RIGHT   eval   Shoulder extension 55  Shoulder flexion 157  Shoulder abduction 174  Shoulder internal rotation 65  Shoulder external rotation 98    (Blank rows = not tested)  A/PROM LEFT   eval  Shoulder extension 56  Shoulder flexion 156  Shoulder abduction 172  Shoulder internal rotation 65  Shoulder external rotation 95    (Blank rows = not  tested)  CERVICAL AROM: All within functional limits:    LUMBAR ROM: Lumbar ROM without increasing pain; Bilateral SB decreased 25%, FB decreased 90%, BB decreased 90% all with pain  ANKLE DF : right -12,  LEFT  -8  HS Length: 30 deg Bilaterally  UPPER EXTREMITY STRENGTH: WFL  LOWER EXTREMITY MMT: WFL     LYMPHEDEMA ASSESSMENTS:   SURGERY TYPE/DATE: 07/09/2021 BILATERAL BREAST LUMPECTOMY WITH RADIOACTIVE SEED LOCALIZATION RADIOACTIVE SEED GUIDED RIGHT AXILLARY TARGETED LYMPH NODE DISSECTION LEFT AXILLARY SENTINEL NODE BIOPSY LEFT AXILLARY LYMPH NODE MAPPING WITH SENTIMAG    NUMBER OF LYMPH NODES REMOVED: Right 1/1, Left 0/1   CHEMOTHERAPY:  neoadjuvant    RADIATION:yes bilateral breast, presently having palliative radiation for bone METS to spine    HORMONE TREATMENT: YES  INFECTIONS: NO  LYMPHEDEMA ASSESSMENTS:     LANDMARK RIGHT  03/04/2022 Right   10 cm proximal to olecranon process 30.5   Olecranon process 27.1   10 cm proximal to ulnar styloid process 21.2   Just proximal to ulnar styloid process 16.6   Across hand at thumb web space 21.4   At base of 2nd digit 6.8   (Blank rows = not tested)   LANDMARK LEFT  03/04/2022 LEFT  10 cm proximal to olecranon process 30.7   Olecranon process 26.7   10 cm proximal to ulnar styloid process 20.5   Just proximal to ulnar styloid process 16.3   Across hand at thumb web space 21.1   At base of 2nd digit 6.7   (Blank rows = not tested)      GAIT: Distance walked: 20 feet Assistive device utilized: None Level of assistance: Complete Independence Comments: decreased heel strike, toe off, sideways translation with some LOB  QUICK DASH SURVEY: not completed   TODAY'S TREATMENT:  DATE: Evaluation only  PATIENT EDUCATION:  Education details: discussed POC/activities that  will be performed Person educated: Patient Education method: Explanation Education comprehension: verbalized understanding  HOME EXERCISE PROGRAM: None given  ASSESSMENT:  CLINICAL IMPRESSION: Patient is a 52 y.o. female who was seen today for physical therapy evaluation and treatment for complications arising from bilateral breast surgeries for Breast Cancer, Radiation and new onset of bone Metastasis. She presents with complaints of Back pain  with limitations in functional activities due to pain .  She continues to have bilateral breast swelling although improved from previously and continues with enlarged pores. She is still awaiting a Flexi touch Sequential Compression pump to assist with this. She has intermittent pain/tightness in bilateral shoulders/chest from effects of radiation but she has been working on this at home and demonstrates very good shoulder mobility at present. She will benefit from skilled PT to address body mechanics and neutral spine activities to aid back pain from METS, in addition to MLD for breast swelling and soft tissue mobilization, ROM, flexibility exercises as needed for muscular tightness. Balance activities and gait training activities to prevent falls would also be beneficial.  OBJECTIVE IMPAIRMENTS: Abnormal gait, decreased activity tolerance, decreased balance, decreased ROM, decreased strength, increased edema, impaired sensation, improper body mechanics, and postural dysfunction.   ACTIVITY LIMITATIONS: lifting, bending, standing, sleeping, stairs, bed mobility, and locomotion level  PARTICIPATION LIMITATIONS: cleaning, laundry, shopping, and occupation  PERSONAL FACTORS: 3+ comorbidities: bilateral Breast Cancer s/p Radiation with new bone Metastasis and palliative radiation for pain  are also affecting patient's functional outcome.   REHAB POTENTIAL: Good  CLINICAL DECISION MAKING: Evolving/moderate complexity  EVALUATION COMPLEXITY:  Low  GOALS: Goals reviewed with patient? Yes  SHORT TERM GOALS: Target date: 07/22/2022  Pt will be independent in a HEP for gentle lumbar/core neutral spine activities and LE flexibility Baseline: Goal status: INITIAL  2.  Pt will be able to get in and out of bed and brush teeth with good body mechanics Baseline: no knowledge Goal status: INITIAL  LONG TERM GOALS: Target date: 2/29/2023  Pt will be approved for Flexi touch Sequential compression pump for management of bilateral breast edema Baseline:  Goal status: INITIAL  2.  Pt will have AROM for bilateral DF improved to atleast 5 degrees for improved gait pattern Baseline:  Goal status: INITIAL  3.  Pt will be educated in proper body mechanics for home activities to prevent injury Baseline: no knowledge Goal status: INITIAL  4.  Pt will ambulate with improved heal strike /toe off and decreased sideways translation for improved energy conservation Baseline:  Goal status: INITIAL    PLAN:  PT FREQUENCY: 1-2x/week  PT DURATION: 8 weeks  PLANNED INTERVENTIONS: Therapeutic exercises, Therapeutic activity, Neuromuscular re-education, Balance training, Patient/Family education, Self Care, Orthotic/Fit training, Aquatic Therapy, Manual lymph drainage, Manual therapy, and Re-evaluation  PLAN FOR NEXT SESSION: body mechanics for in/out of bed, hip hinge, gastroc and HS stretches, Meeks decompression exercises, gentle core strength, STM prn for chest/muscular tightness, shoulder ROM prn,    Claris Pong, PT 06/25/2022, 1:29 PM

## 2022-06-28 ENCOUNTER — Ambulatory Visit
Admission: RE | Admit: 2022-06-28 | Discharge: 2022-06-28 | Disposition: A | Discharge status: Home / Self Care | Payer: Medicaid Other | Source: Ambulatory Visit | Attending: Radiation Oncology | Admitting: Radiation Oncology

## 2022-06-28 ENCOUNTER — Emergency Department (HOSPITAL_COMMUNITY): Payer: Medicaid Other

## 2022-06-28 ENCOUNTER — Other Ambulatory Visit: Payer: Self-pay

## 2022-06-28 ENCOUNTER — Encounter (HOSPITAL_COMMUNITY): Payer: Self-pay

## 2022-06-28 ENCOUNTER — Inpatient Hospital Stay (HOSPITAL_COMMUNITY)
Admission: EM | Admit: 2022-06-28 | Discharge: 2022-06-30 | DRG: 391 | Disposition: A | Discharge status: Home / Self Care | Payer: Medicaid Other | Attending: Internal Medicine | Admitting: Internal Medicine

## 2022-06-28 DIAGNOSIS — Z853 Personal history of malignant neoplasm of breast: Secondary | ICD-10-CM

## 2022-06-28 DIAGNOSIS — K5900 Constipation, unspecified: Secondary | ICD-10-CM

## 2022-06-28 DIAGNOSIS — K529 Noninfective gastroenteritis and colitis, unspecified: Secondary | ICD-10-CM | POA: Diagnosis present

## 2022-06-28 DIAGNOSIS — C50911 Malignant neoplasm of unspecified site of right female breast: Secondary | ICD-10-CM | POA: Diagnosis present

## 2022-06-28 DIAGNOSIS — Z90710 Acquired absence of both cervix and uterus: Secondary | ICD-10-CM

## 2022-06-28 DIAGNOSIS — K5790 Diverticulosis of intestine, part unspecified, without perforation or abscess without bleeding: Secondary | ICD-10-CM | POA: Diagnosis present

## 2022-06-28 DIAGNOSIS — K226 Gastro-esophageal laceration-hemorrhage syndrome: Secondary | ICD-10-CM | POA: Diagnosis present

## 2022-06-28 DIAGNOSIS — K449 Diaphragmatic hernia without obstruction or gangrene: Secondary | ICD-10-CM | POA: Diagnosis present

## 2022-06-28 DIAGNOSIS — R112 Nausea with vomiting, unspecified: Secondary | ICD-10-CM

## 2022-06-28 DIAGNOSIS — E876 Hypokalemia: Secondary | ICD-10-CM | POA: Diagnosis present

## 2022-06-28 DIAGNOSIS — C7951 Secondary malignant neoplasm of bone: Secondary | ICD-10-CM | POA: Diagnosis present

## 2022-06-28 DIAGNOSIS — K5909 Other constipation: Secondary | ICD-10-CM | POA: Diagnosis present

## 2022-06-28 DIAGNOSIS — K2971 Gastritis, unspecified, with bleeding: Secondary | ICD-10-CM | POA: Diagnosis present

## 2022-06-28 DIAGNOSIS — Z803 Family history of malignant neoplasm of breast: Secondary | ICD-10-CM

## 2022-06-28 DIAGNOSIS — T451X5A Adverse effect of antineoplastic and immunosuppressive drugs, initial encounter: Secondary | ICD-10-CM | POA: Diagnosis present

## 2022-06-28 DIAGNOSIS — Z170 Estrogen receptor positive status [ER+]: Secondary | ICD-10-CM

## 2022-06-28 DIAGNOSIS — K222 Esophageal obstruction: Secondary | ICD-10-CM | POA: Diagnosis present

## 2022-06-28 DIAGNOSIS — C50919 Malignant neoplasm of unspecified site of unspecified female breast: Secondary | ICD-10-CM

## 2022-06-28 DIAGNOSIS — D509 Iron deficiency anemia, unspecified: Secondary | ICD-10-CM | POA: Diagnosis present

## 2022-06-28 DIAGNOSIS — K219 Gastro-esophageal reflux disease without esophagitis: Secondary | ICD-10-CM | POA: Diagnosis present

## 2022-06-28 DIAGNOSIS — Z86718 Personal history of other venous thrombosis and embolism: Secondary | ICD-10-CM

## 2022-06-28 DIAGNOSIS — Z8249 Family history of ischemic heart disease and other diseases of the circulatory system: Secondary | ICD-10-CM

## 2022-06-28 DIAGNOSIS — I10 Essential (primary) hypertension: Secondary | ICD-10-CM | POA: Diagnosis present

## 2022-06-28 DIAGNOSIS — Z8049 Family history of malignant neoplasm of other genital organs: Secondary | ICD-10-CM

## 2022-06-28 DIAGNOSIS — Z8041 Family history of malignant neoplasm of ovary: Secondary | ICD-10-CM

## 2022-06-28 DIAGNOSIS — K319 Disease of stomach and duodenum, unspecified: Principal | ICD-10-CM | POA: Diagnosis present

## 2022-06-28 DIAGNOSIS — K920 Hematemesis: Principal | ICD-10-CM | POA: Diagnosis present

## 2022-06-28 LAB — RAD ONC ARIA SESSION SUMMARY
Course Elapsed Days: 5
Plan Fractions Treated to Date: 4
Plan Prescribed Dose Per Fraction: 3 Gy
Plan Total Fractions Prescribed: 10
Plan Total Prescribed Dose: 30 Gy
Reference Point Dosage Given to Date: 12 Gy
Reference Point Session Dosage Given: 3 Gy
Session Number: 4

## 2022-06-28 LAB — COMPREHENSIVE METABOLIC PANEL
ALT: 17 U/L (ref 0–44)
AST: 17 U/L (ref 15–41)
Albumin: 4.4 g/dL (ref 3.5–5.0)
Alkaline Phosphatase: 102 U/L (ref 38–126)
Anion gap: 8 (ref 5–15)
BUN: 9 mg/dL (ref 6–20)
CO2: 23 mmol/L (ref 22–32)
Calcium: 8 mg/dL — ABNORMAL LOW (ref 8.9–10.3)
Chloride: 103 mmol/L (ref 98–111)
Creatinine, Ser: 0.83 mg/dL (ref 0.44–1.00)
GFR, Estimated: >60 mL/min (ref >60)
Glucose, Bld: 90 mg/dL (ref 70–99)
Potassium: 3.7 mmol/L (ref 3.5–5.1)
Sodium: 134 mmol/L — ABNORMAL LOW (ref 135–145)
Total Bilirubin: 0.5 mg/dL (ref 0.3–1.2)
Total Protein: 8.1 g/dL (ref 6.5–8.1)

## 2022-06-28 LAB — CBC WITH DIFFERENTIAL/PLATELET
Abs Immature Granulocytes: 0.04 10*3/uL (ref 0.00–0.07)
Basophils Absolute: 0 10*3/uL (ref 0.0–0.1)
Basophils Relative: 1 %
Eosinophils Absolute: 0 10*3/uL (ref 0.0–0.5)
Eosinophils Relative: 1 %
HCT: 34.8 % — ABNORMAL LOW (ref 36.0–46.0)
Hemoglobin: 10.8 g/dL — ABNORMAL LOW (ref 12.0–15.0)
Immature Granulocytes: 1 %
Lymphocytes Relative: 16 %
Lymphs Abs: 0.6 10*3/uL — ABNORMAL LOW (ref 0.7–4.0)
MCH: 24.1 pg — ABNORMAL LOW (ref 26.0–34.0)
MCHC: 31 g/dL (ref 30.0–36.0)
MCV: 77.5 fL — ABNORMAL LOW (ref 80.0–100.0)
Monocytes Absolute: 0.3 10*3/uL (ref 0.1–1.0)
Monocytes Relative: 7 %
Neutro Abs: 2.8 10*3/uL (ref 1.7–7.7)
Neutrophils Relative %: 74 %
Platelets: 333 10*3/uL (ref 150–400)
RBC: 4.49 MIL/uL (ref 3.87–5.11)
RDW: 16.3 % — ABNORMAL HIGH (ref 11.5–15.5)
WBC: 3.8 10*3/uL — ABNORMAL LOW (ref 4.0–10.5)
nRBC: 0 % (ref 0.0–0.2)

## 2022-06-28 LAB — PROTIME-INR
INR: 1.1 (ref 0.8–1.2)
Prothrombin Time: 13.9 seconds (ref 11.4–15.2)

## 2022-06-28 LAB — TROPONIN I (HIGH SENSITIVITY): Troponin I (High Sensitivity): 7 ng/L (ref <18)

## 2022-06-28 LAB — TYPE AND SCREEN
ABO/RH(D): B POS
Antibody Screen: NEGATIVE

## 2022-06-28 LAB — LIPASE, BLOOD: Lipase: 31 U/L (ref 11–51)

## 2022-06-28 MED ORDER — ACETAMINOPHEN 650 MG RE SUPP: 650.0000 mg | Freq: Four times a day (QID) | RECTAL | Status: DC | PRN

## 2022-06-28 MED ORDER — IOHEXOL 350 MG/ML SOLN
100.0000 mL | Freq: Once | INTRAVENOUS | Status: AC | PRN
  Administered 2022-06-28: 100 mL via INTRAVENOUS

## 2022-06-28 MED ORDER — ONDANSETRON HCL 4 MG/2ML IJ SOLN
4.0000 mg | Freq: Once | INTRAMUSCULAR | Status: AC
  Administered 2022-06-28: 4 mg via INTRAVENOUS
  Filled 2022-06-28: qty 2

## 2022-06-28 MED ORDER — PANTOPRAZOLE SODIUM 40 MG IV SOLR: 40.0000 mg | Freq: Two times a day (BID) | INTRAVENOUS | Status: DC

## 2022-06-28 MED ORDER — HYDROMORPHONE HCL 1 MG/ML IJ SOLN
1.0000 mg | Freq: Once | INTRAMUSCULAR | Status: AC
  Administered 2022-06-28: 1 mg via INTRAVENOUS
  Filled 2022-06-28: qty 1

## 2022-06-28 MED ORDER — ACETAMINOPHEN 325 MG PO TABS: 650.0000 mg | ORAL_TABLET | Freq: Four times a day (QID) | ORAL | Status: DC | PRN

## 2022-06-28 MED ORDER — ONDANSETRON HCL 4 MG/2ML IJ SOLN: 4.0000 mg | Freq: Four times a day (QID) | INTRAMUSCULAR | Status: DC | PRN

## 2022-06-28 MED ORDER — FAMOTIDINE IN NACL 20-0.9 MG/50ML-% IV SOLN
20.0000 mg | Freq: Once | INTRAVENOUS | Status: AC
  Administered 2022-06-28: 20 mg via INTRAVENOUS
  Filled 2022-06-28: qty 50

## 2022-06-28 MED ORDER — ONDANSETRON HCL 4 MG PO TABS: 4.0000 mg | ORAL_TABLET | Freq: Four times a day (QID) | ORAL | Status: DC | PRN

## 2022-06-28 MED ORDER — SODIUM CHLORIDE 0.9 % IV BOLUS
1000.0000 mL | Freq: Once | INTRAVENOUS | Status: AC
  Administered 2022-06-28: 1000 mL via INTRAVENOUS

## 2022-06-28 MED ORDER — PANTOPRAZOLE 80MG IVPB - SIMPLE MED
80.0000 mg | Freq: Once | INTRAVENOUS | Status: AC
  Administered 2022-06-28: 80 mg via INTRAVENOUS
  Filled 2022-06-28: qty 80

## 2022-06-28 MED ORDER — PANTOPRAZOLE INFUSION (NEW) - SIMPLE MED
8.0000 mg/h | INTRAVENOUS | Status: DC
  Administered 2022-06-29: 8 mg/h via INTRAVENOUS
  Filled 2022-06-28: qty 80
  Filled 2022-06-28: qty 100

## 2022-06-28 MED ORDER — TRANEXAMIC ACID FOR INHALATION
500.0000 mg | Freq: Once | RESPIRATORY_TRACT | Status: DC
  Filled 2022-06-28: qty 10

## 2022-06-28 MED ORDER — HYDROMORPHONE HCL 1 MG/ML IJ SOLN
0.5000 mg | INTRAMUSCULAR | Status: DC | PRN
  Administered 2022-06-29: 1 mg via INTRAVENOUS
  Filled 2022-06-28: qty 1

## 2022-06-28 NOTE — Assessment & Plan Note (Signed)
Breast CA with diffuse bone mets. Dilaudid IV PRN pain for the moment since doesn't sound like she's going to be able to take POs for the moment (hematemesis when ever she tries to eat or drink anything). Having to hold verzenio and Arimidex as well for the moment

## 2022-06-28 NOTE — ED Triage Notes (Addendum)
Patient reports that she ha d a radiation treatment at 1400 today. Patient went back to work afterwards. Patient states she had a syncopal episode and began vomiting more than  a liter. Patient states she had bright red blood with clots in the bag.  Patient states that she has a headache as well.  Patient also reports no BM in 5 days.

## 2022-06-28 NOTE — ED Provider Notes (Signed)
[facility] DEPT Provider Note   CSN: [id number] Arrival date & time: 06/28/22  1741     History  Chief Complaint  Patient presents with   Cancer patient   Hematemesis   Loss of Consciousness   Constipation    Chelsea Hansen is a 52 y.o. female history of breast cancer with mets to the lung and abdomen, here presenting with hematemesis and loss of consciousness.  Patient states that she is on her second round of radiation and received it today.  She went to work and then passed out after she had a large episode of hematemesis.  She states that she ate some food afterwards and vomited more blood.  She states that it is several bags of blood.  Patient is not on blood thinners.  Patient did not have similar side effects during her first radiation.  Patient is on oral chemo currently.  The history is provided by the patient.       Home Medications Prior to Admission medications   Medication Sig Start Date End Date Taking? Authorizing Provider  abemaciclib (VERZENIO) 50 MG tablet Take 1 tablet (50 mg total) by mouth 2 (two) times daily. 05/25/22   [name], MD  albuterol (VENTOLIN HFA) 108 (90 Base) MCG/ACT inhaler Inhale 2 puffs by mouth into the lungs every 6 hours as needed for wheezing or shortness of breath. 11/04/21   [name], NP  amLODipine (NORVASC) 10 MG tablet Take 1 tablet  by mouth daily. 07/13/21   [name], NP  anastrozole (ARIMIDEX) 1 MG tablet Take 1 tablet (1 mg total) by mouth daily. 03/22/22   [name], NP  ARIPiprazole (ABILIFY) 15 MG tablet Take 1 tablet by mouth at bedtime 08/02/21     calcium-vitamin D (OSCAL WITH D) 500-5 MG-MCG tablet Take 1 tablet by mouth daily with supper. 06/24/22   [name], MD  escitalopram (LEXAPRO) 20 MG tablet Take 1 tablet (20 mg total) by mouth daily. 02/20/21   [name], NP  hydrochlorothiazide (HYDRODIURIL) 25 MG tablet Take 1 tablet by mouth daily.  01/22/22   [name], NP  montelukast (SINGULAIR) 10 MG tablet Take 1 tablet (10 mg total) by mouth at bedtime. 02/20/21   [name], NP  oxyCODONE ER 9 MG C12A Take 1 capsule by mouth 2 times daily. 06/24/22   [name], Carlena Sax, NP  oxyCODONE-acetaminophen (PERCOCET) 7.5-325 MG tablet Take 1 - 2 tablets by mouth every 6 (six) hours as needed for severe pain. 06/24/22   [name], Carlena Sax, NP  pantoprazole (PROTONIX) 40 MG tablet Take 1 tablet (40 mg total) by mouth daily. 05/24/22   [name], NP  prochlorperazine (COMPAZINE) 10 MG tablet Take 1 tablet (10 mg total) by mouth every 6 (six) hours as needed for nausea or vomiting. 06/18/22   Causey, Charlestine Massed, NP  promethazine (PHENERGAN) 25 MG suppository Place 1 suppository (25 mg total) rectally every 6 (six) hours as needed for up to 3 days for nausea or vomiting. 05/25/22 05/29/22  Walisiewicz, Harley Hallmark, PA-C  propranolol (INDERAL) 10 MG tablet Take 1 tablet (10 mg total) by mouth 2 (two) times daily. 04/28/21   [name], [name], DO  valsartan (DIOVAN) 40 MG tablet Take 1 tablet (40 mg total) by mouth daily. 07/27/21   [name], NP      Allergies    Patient has no known allergies.    Review of Systems  Review of Systems  Gastrointestinal:  Positive for abdominal pain and vomiting.  All other systems reviewed and are negative.   Physical Exam Updated Vital Signs BP 139/86 (BP Location: Right Arm)   Pulse (!) 51   Temp 98.2 F (36.8 C) (Oral)   Resp 14   Ht '5\' 11"'$  (1.803 m)   Wt 98.9 kg   SpO2 98%   BMI 30.40 kg/m  Physical Exam Vitals and nursing note reviewed.  Constitutional:      Comments: Uncomfortable and dehydrated and vomiting  HENT:     Head: Normocephalic.     Nose: Nose normal.     Mouth/Throat:     Mouth: Mucous membranes are dry.  Eyes:     Extraocular Movements: Extraocular movements intact.     Pupils: Pupils are equal, round, and reactive to light.   Cardiovascular:     Rate and Rhythm: Regular rhythm. Tachycardia present.     Heart sounds: Normal heart sounds.  Pulmonary:     Effort: Pulmonary effort is normal.     Breath sounds: Normal breath sounds.  Abdominal:     General: Abdomen is flat.     Comments: Mild epigastric tenderness  Musculoskeletal:     Cervical back: Normal range of motion and neck supple.  Skin:    General: Skin is warm.     Capillary Refill: Capillary refill takes less than 2 seconds.  Neurological:     General: No focal deficit present.  Psychiatric:        Mood and Affect: Mood normal.     ED Results / Procedures / Treatments   Labs (all labs ordered are listed, but only abnormal results are displayed) Labs Reviewed  COMPREHENSIVE METABOLIC PANEL - Abnormal; Notable for the following components:      Result Value   Sodium 134 (*)    Calcium 8.0 (*)    All other components within normal limits  CBC WITH DIFFERENTIAL/PLATELET - Abnormal; Notable for the following components:   WBC 3.8 (*)    Hemoglobin 10.8 (*)    HCT 34.8 (*)    MCV 77.5 (*)    MCH 24.1 (*)    RDW 16.3 (*)    Lymphs Abs 0.6 (*)    All other components within normal limits  LIPASE, BLOOD  PROTIME-INR  TYPE AND SCREEN  TROPONIN I (HIGH SENSITIVITY)  TROPONIN I (HIGH SENSITIVITY)    EKG EKG Interpretation  Date/Time:  Monday June 28 2022 19:07:59 EST Ventricular Rate:  65 PR Interval:  123 QRS Duration: 102 QT Interval:  411 QTC Calculation: 428 R Axis:   14 Text Interpretation: Sinus rhythm RSR' in V1 or V2, probably normal variant No significant change since last tracing Confirmed by Wandra Arthurs [phone]) on 06/28/2022 7:42:54 PM  Radiology CT Angio Chest PE W and/or Wo Contrast  Result Date: 06/28/2022 CLINICAL DATA:  Pulmonary embolism (PE) suspected, high prob; Pancreatitis, acute, severe. Breast cancer, hematemesis EXAM: CT ANGIOGRAPHY CHEST CT ABDOMEN AND PELVIS WITH CONTRAST TECHNIQUE: Multidetector CT  imaging of the chest was performed using the standard protocol during bolus administration of intravenous contrast. Multiplanar CT image reconstructions and MIPs were obtained to evaluate the vascular anatomy. Multidetector CT imaging of the abdomen and pelvis was performed using the standard protocol during bolus administration of intravenous contrast. RADIATION DOSE REDUCTION: This exam was performed according to the departmental dose-optimization program which includes automated exposure control, adjustment of the mA and/or kV according to patient size and/or  use of iterative reconstruction technique. CONTRAST:  148m OMNIPAQUE IOHEXOL 350 MG/ML SOLN COMPARISON:  CT chest 05/11/2022, PET CT 06/02/2022 FINDINGS: CTA CHEST FINDINGS Cardiovascular: Adequate opacification of the pulmonary arterial tree. No intraluminal filling defect identified to suggest acute pulmonary embolism. Central pulmonary arteries are mildly enlarged suggesting changes of pulmonary arterial hypertension. No significant coronary artery calcification. Cardiac size is at the upper limits of normal. No pericardial effusion. No significant atherosclerotic calcification within the thoracic aorta. No aortic aneurysm. Mediastinum/Nodes: Visualized thyroid is unremarkable. No pathologic adenopathy within the thorax. The esophagus is unremarkable. Small hiatal hernia. Lungs/Pleura: Mild subpleural reticulation and nodularity within the anterior right upper lobe and right middle lobe may represent post radiation changes. The lungs are otherwise clear. No confluent pulmonary infiltrate. No pneumothorax or pleural effusion. Central airways are widely patent. Musculoskeletal: Surgical clip surround asymmetric soft tissue within the right breast, incompletely visualized on this examination. Numerous sclerotic foci are seen throughout the thoracic spine and sternum in keeping with changes of osteoblastic bone metastases. These appears stable since prior  examination of 05/11/2022. No associated pathologic fracture. Review of the MIP images confirms the above findings. CT ABDOMEN and PELVIS FINDINGS Hepatobiliary: No focal liver abnormality is seen. No gallstones, gallbladder wall thickening, or biliary dilatation. Pancreas: Unremarkable Spleen: Unremarkable Adrenals/Urinary Tract: Adrenal glands are unremarkable. Kidneys are normal, without renal calculi, focal lesion, or hydronephrosis. Bladder is unremarkable. Stomach/Bowel: Moderate to severe pancolonic diverticulosis, most severe within the cecum and ascending colon. The stomach, small bowel, and large bowel are otherwise unremarkable. No evidence of obstruction or focal inflammation. The appendix is normal. No free intraperitoneal gas or fluid. Vascular/Lymphatic: No significant vascular findings are present. No enlarged abdominal or pelvic lymph nodes. Reproductive: Status post hysterectomy. No adnexal masses. Other: No abdominal wall hernia or abnormality. No abdominopelvic ascites. Musculoskeletal: Mixed lytic and sclerotic metastases are seen throughout the visualized axial skeleton with stable pathologic compression fracture of L3 without associated retropulsion or listhesis. No acute bone abnormality. Review of the MIP images confirms the above findings. IMPRESSION: 1. No pulmonary embolism. No acute intrathoracic pathology identified. 2. No acute intra-abdominal pathology identified. No definite radiographic explanation for the patient's reported symptoms. 3. Moderate to severe pancolonic diverticulosis without superimposed acute inflammatory change. 4. Diffuse osseous metastatic disease with stable pathologic compression fracture of L3. Electronically Signed   By: AFidela SalisburyM.D.   On: 06/28/2022 21:45   CT ABDOMEN PELVIS W CONTRAST  Result Date: 06/28/2022 CLINICAL DATA:  Pulmonary embolism (PE) suspected, high prob; Pancreatitis, acute, severe. Breast cancer, hematemesis EXAM: CT ANGIOGRAPHY  CHEST CT ABDOMEN AND PELVIS WITH CONTRAST TECHNIQUE: Multidetector CT imaging of the chest was performed using the standard protocol during bolus administration of intravenous contrast. Multiplanar CT image reconstructions and MIPs were obtained to evaluate the vascular anatomy. Multidetector CT imaging of the abdomen and pelvis was performed using the standard protocol during bolus administration of intravenous contrast. RADIATION DOSE REDUCTION: This exam was performed according to the departmental dose-optimization program which includes automated exposure control, adjustment of the mA and/or kV according to patient size and/or use of iterative reconstruction technique. CONTRAST:  1065mOMNIPAQUE IOHEXOL 350 MG/ML SOLN COMPARISON:  CT chest 05/11/2022, PET CT 06/02/2022 FINDINGS: CTA CHEST FINDINGS Cardiovascular: Adequate opacification of the pulmonary arterial tree. No intraluminal filling defect identified to suggest acute pulmonary embolism. Central pulmonary arteries are mildly enlarged suggesting changes of pulmonary arterial hypertension. No significant coronary artery calcification. Cardiac size is at the upper limits of normal.  No pericardial effusion. No significant atherosclerotic calcification within the thoracic aorta. No aortic aneurysm. Mediastinum/Nodes: Visualized thyroid is unremarkable. No pathologic adenopathy within the thorax. The esophagus is unremarkable. Small hiatal hernia. Lungs/Pleura: Mild subpleural reticulation and nodularity within the anterior right upper lobe and right middle lobe may represent post radiation changes. The lungs are otherwise clear. No confluent pulmonary infiltrate. No pneumothorax or pleural effusion. Central airways are widely patent. Musculoskeletal: Surgical clip surround asymmetric soft tissue within the right breast, incompletely visualized on this examination. Numerous sclerotic foci are seen throughout the thoracic spine and sternum in keeping with  changes of osteoblastic bone metastases. These appears stable since prior examination of 05/11/2022. No associated pathologic fracture. Review of the MIP images confirms the above findings. CT ABDOMEN and PELVIS FINDINGS Hepatobiliary: No focal liver abnormality is seen. No gallstones, gallbladder wall thickening, or biliary dilatation. Pancreas: Unremarkable Spleen: Unremarkable Adrenals/Urinary Tract: Adrenal glands are unremarkable. Kidneys are normal, without renal calculi, focal lesion, or hydronephrosis. Bladder is unremarkable. Stomach/Bowel: Moderate to severe pancolonic diverticulosis, most severe within the cecum and ascending colon. The stomach, small bowel, and large bowel are otherwise unremarkable. No evidence of obstruction or focal inflammation. The appendix is normal. No free intraperitoneal gas or fluid. Vascular/Lymphatic: No significant vascular findings are present. No enlarged abdominal or pelvic lymph nodes. Reproductive: Status post hysterectomy. No adnexal masses. Other: No abdominal wall hernia or abnormality. No abdominopelvic ascites. Musculoskeletal: Mixed lytic and sclerotic metastases are seen throughout the visualized axial skeleton with stable pathologic compression fracture of L3 without associated retropulsion or listhesis. No acute bone abnormality. Review of the MIP images confirms the above findings. IMPRESSION: 1. No pulmonary embolism. No acute intrathoracic pathology identified. 2. No acute intra-abdominal pathology identified. No definite radiographic explanation for the patient's reported symptoms. 3. Moderate to severe pancolonic diverticulosis without superimposed acute inflammatory change. 4. Diffuse osseous metastatic disease with stable pathologic compression fracture of L3. Electronically Signed   By: Fidela Salisbury M.D.   On: 06/28/2022 21:45    Procedures Procedures    Medications Ordered in ED Medications  pantoprazole (PROTONIX) 80 mg /NS 100 mL IVPB (80  mg Intravenous New Bag/Given 06/28/22 2213)  ondansetron (ZOFRAN) injection 4 mg (4 mg Intravenous Given 06/28/22 2028)  famotidine (PEPCID) IVPB 20 mg premix (0 mg Intravenous Stopped 06/28/22 2152)  HYDROmorphone (DILAUDID) injection 1 mg (1 mg Intravenous Given 06/28/22 2027)  sodium chloride 0.9 % bolus 1,000 mL (0 mLs Intravenous Stopped 06/28/22 2153)  iohexol (OMNIPAQUE) 350 MG/ML injection 100 mL (100 mLs Intravenous Contrast Given 06/28/22 2114)    ED Course/ Medical Decision Making/ A&P                           Medical Decision Making SHATANA SAXTON is a 52 y.o. female here presenting with abdominal pain and hematemesis.  Patient just had radiation today and had hematemesis.  Concern for possible side effect of radiation.  Also consider gastritis or gastric ulcer causing bleeding.  Patient never had an endoscopy previously.  Plan to get CTA chest to rule out mets or alveolar hemorrhage and also CT abdomen pelvis to look for gastric ulcer with bleeding.  Will check CBC and CMP as well.  Will give Pepcid and Protonix and Zofran and IV fluids.  10:25 PM Patient's hemoglobin is stable. CT chest abdomen pelvis showed no PE or Intra abdominal pathology.  Patient does have diverticulosis but has no bright red blood per  rectum just hematemesis.  I messaged Dr. Fuller Plan from [facility] who scoped her previously.  Patient may need endoscopy but I suspect esophagitis from radiation.  Hospitalist to admit.  Problems Addressed: Hematemesis with nausea: acute illness or injury Malignant neoplasm of female breast, unspecified estrogen receptor status, unspecified laterality, unspecified site of breast [facility]): chronic illness or injury  Amount and/or Complexity of Data Reviewed Labs: ordered. Decision-making details documented in ED Course. Radiology: ordered and independent interpretation performed. Decision-making details documented in ED Course.  Risk Prescription drug management.    Final Clinical  Impression(s) / ED Diagnoses Final diagnoses:  None    Rx / DC Orders ED Discharge Orders     None         Drenda Freeze, MD 06/28/22 2226

## 2022-06-28 NOTE — ED Provider Triage Note (Signed)
Emergency Medicine Provider Triage Evaluation Note  Chelsea Hansen , a 52 y.o. female  was evaluated in triage.  Pt complains of upper abdominal pain and chest pain.  Patient had radiation today, she had a syncopal event after that.  She states that she vomited bright red blood with clots feeling in emesis bag.  No history of bleeds, not on blood thinners.   On chemo and radiation for metastatic cancer to bone  Review of Systems  Per HPI  Physical Exam  BP 131/84 (BP Location: Left Arm)   Pulse 60   Temp 98.2 F (36.8 C) (Oral)   Resp 16   Ht '5\' 11"'$  (1.803 m)   Wt 98.9 kg   SpO2 100%   BMI 30.40 kg/m  Gen:   Awake, no distress   Resp:  Normal effort  MSK:   Moves extremities without difficulty  Other:  Upper abdominal tenderness.  Upper and lower extremity pulses symmetric bilaterally.  Medical Decision Making  Medically screening exam initiated at 6:59 PM.  Appropriate orders placed.  Chelsea Hansen was informed that the remainder of the evaluation will be completed by another provider, this initial triage assessment does not replace that evaluation, and the importance of remaining in the ED until their evaluation is complete.  Charge is aware patient is to have next room.   Sherrill Raring, PA-C 06/28/22 1900

## 2022-06-29 ENCOUNTER — Observation Stay (HOSPITAL_BASED_OUTPATIENT_CLINIC_OR_DEPARTMENT_OTHER): Payer: Medicaid Other | Admitting: Certified Registered"

## 2022-06-29 ENCOUNTER — Encounter (HOSPITAL_COMMUNITY)
Admission: EM | Disposition: A | Discharge status: Home / Self Care | Payer: Self-pay | Source: Home / Self Care | Attending: Internal Medicine

## 2022-06-29 ENCOUNTER — Ambulatory Visit: Payer: Medicaid Other

## 2022-06-29 ENCOUNTER — Inpatient Hospital Stay (HOSPITAL_COMMUNITY): Payer: Medicaid Other

## 2022-06-29 ENCOUNTER — Encounter (HOSPITAL_COMMUNITY): Payer: Self-pay | Admitting: Internal Medicine

## 2022-06-29 ENCOUNTER — Observation Stay (HOSPITAL_COMMUNITY): Payer: Medicaid Other | Admitting: Certified Registered"

## 2022-06-29 DIAGNOSIS — C7951 Secondary malignant neoplasm of bone: Secondary | ICD-10-CM

## 2022-06-29 DIAGNOSIS — C50411 Malignant neoplasm of upper-outer quadrant of right female breast: Secondary | ICD-10-CM

## 2022-06-29 DIAGNOSIS — K449 Diaphragmatic hernia without obstruction or gangrene: Secondary | ICD-10-CM | POA: Diagnosis present

## 2022-06-29 DIAGNOSIS — K920 Hematemesis: Secondary | ICD-10-CM | POA: Diagnosis present

## 2022-06-29 DIAGNOSIS — Z8049 Family history of malignant neoplasm of other genital organs: Secondary | ICD-10-CM | POA: Diagnosis not present

## 2022-06-29 DIAGNOSIS — E876 Hypokalemia: Secondary | ICD-10-CM | POA: Diagnosis present

## 2022-06-29 DIAGNOSIS — K222 Esophageal obstruction: Secondary | ICD-10-CM

## 2022-06-29 DIAGNOSIS — Z170 Estrogen receptor positive status [ER+]: Secondary | ICD-10-CM | POA: Diagnosis not present

## 2022-06-29 DIAGNOSIS — Z8249 Family history of ischemic heart disease and other diseases of the circulatory system: Secondary | ICD-10-CM | POA: Diagnosis not present

## 2022-06-29 DIAGNOSIS — I10 Essential (primary) hypertension: Secondary | ICD-10-CM | POA: Diagnosis present

## 2022-06-29 DIAGNOSIS — Z803 Family history of malignant neoplasm of breast: Secondary | ICD-10-CM | POA: Diagnosis not present

## 2022-06-29 DIAGNOSIS — C50919 Malignant neoplasm of unspecified site of unspecified female breast: Secondary | ICD-10-CM | POA: Diagnosis not present

## 2022-06-29 DIAGNOSIS — T451X5A Adverse effect of antineoplastic and immunosuppressive drugs, initial encounter: Secondary | ICD-10-CM | POA: Diagnosis present

## 2022-06-29 DIAGNOSIS — K5909 Other constipation: Secondary | ICD-10-CM | POA: Diagnosis present

## 2022-06-29 DIAGNOSIS — K229 Disease of esophagus, unspecified: Secondary | ICD-10-CM

## 2022-06-29 DIAGNOSIS — R112 Nausea with vomiting, unspecified: Secondary | ICD-10-CM | POA: Diagnosis not present

## 2022-06-29 DIAGNOSIS — J45909 Unspecified asthma, uncomplicated: Secondary | ICD-10-CM

## 2022-06-29 DIAGNOSIS — K219 Gastro-esophageal reflux disease without esophagitis: Secondary | ICD-10-CM | POA: Diagnosis present

## 2022-06-29 DIAGNOSIS — K226 Gastro-esophageal laceration-hemorrhage syndrome: Secondary | ICD-10-CM | POA: Diagnosis present

## 2022-06-29 DIAGNOSIS — K319 Disease of stomach and duodenum, unspecified: Secondary | ICD-10-CM | POA: Diagnosis present

## 2022-06-29 DIAGNOSIS — K5790 Diverticulosis of intestine, part unspecified, without perforation or abscess without bleeding: Secondary | ICD-10-CM | POA: Diagnosis present

## 2022-06-29 DIAGNOSIS — Z853 Personal history of malignant neoplasm of breast: Secondary | ICD-10-CM | POA: Diagnosis not present

## 2022-06-29 DIAGNOSIS — D649 Anemia, unspecified: Secondary | ICD-10-CM

## 2022-06-29 DIAGNOSIS — Z86718 Personal history of other venous thrombosis and embolism: Secondary | ICD-10-CM | POA: Diagnosis not present

## 2022-06-29 DIAGNOSIS — K2971 Gastritis, unspecified, with bleeding: Secondary | ICD-10-CM | POA: Diagnosis present

## 2022-06-29 DIAGNOSIS — K3189 Other diseases of stomach and duodenum: Secondary | ICD-10-CM

## 2022-06-29 DIAGNOSIS — Z8041 Family history of malignant neoplasm of ovary: Secondary | ICD-10-CM | POA: Diagnosis not present

## 2022-06-29 DIAGNOSIS — K529 Noninfective gastroenteritis and colitis, unspecified: Secondary | ICD-10-CM | POA: Diagnosis present

## 2022-06-29 DIAGNOSIS — F418 Other specified anxiety disorders: Secondary | ICD-10-CM

## 2022-06-29 DIAGNOSIS — D509 Iron deficiency anemia, unspecified: Secondary | ICD-10-CM | POA: Diagnosis present

## 2022-06-29 DIAGNOSIS — Z90710 Acquired absence of both cervix and uterus: Secondary | ICD-10-CM | POA: Diagnosis not present

## 2022-06-29 HISTORY — PX: ESOPHAGOGASTRODUODENOSCOPY (EGD) WITH PROPOFOL: SHX5813

## 2022-06-29 HISTORY — PX: BIOPSY: SHX5522

## 2022-06-29 LAB — TROPONIN I (HIGH SENSITIVITY): Troponin I (High Sensitivity): 7 ng/L (ref <18)

## 2022-06-29 LAB — BASIC METABOLIC PANEL
Anion gap: 6 (ref 5–15)
BUN: 8 mg/dL (ref 6–20)
CO2: 22 mmol/L (ref 22–32)
Calcium: 7.3 mg/dL — ABNORMAL LOW (ref 8.9–10.3)
Chloride: 108 mmol/L (ref 98–111)
Creatinine, Ser: 0.7 mg/dL (ref 0.44–1.00)
GFR, Estimated: >60 mL/min (ref >60)
Glucose, Bld: 90 mg/dL (ref 70–99)
Potassium: 3.4 mmol/L — ABNORMAL LOW (ref 3.5–5.1)
Sodium: 136 mmol/L (ref 135–145)

## 2022-06-29 LAB — HEMOGLOBIN AND HEMATOCRIT, BLOOD
HCT: 31.4 % — ABNORMAL LOW (ref 36.0–46.0)
HCT: 30.1 % — ABNORMAL LOW (ref 36.0–46.0)
Hemoglobin: 9.8 g/dL — ABNORMAL LOW (ref 12.0–15.0)
Hemoglobin: 9.2 g/dL — ABNORMAL LOW (ref 12.0–15.0)

## 2022-06-29 LAB — MAGNESIUM: Magnesium: 2.1 mg/dL (ref 1.7–2.4)

## 2022-06-29 LAB — HIV ANTIBODY (ROUTINE TESTING W REFLEX): HIV Screen 4th Generation wRfx: NONREACTIVE

## 2022-06-29 SURGERY — ESOPHAGOGASTRODUODENOSCOPY (EGD) WITH PROPOFOL
Anesthesia: Monitor Anesthesia Care

## 2022-06-29 MED ORDER — LACTATED RINGERS IV SOLN: INTRAVENOUS | Status: DC

## 2022-06-29 MED ORDER — METOCLOPRAMIDE HCL 5 MG/ML IJ SOLN
5.0000 mg | Freq: Four times a day (QID) | INTRAMUSCULAR | Status: DC
  Administered 2022-06-29 – 2022-06-30 (×3): 5 mg via INTRAVENOUS
  Filled 2022-06-29 (×3): qty 2

## 2022-06-29 MED ORDER — LACTATED RINGERS IV SOLN: Freq: Once | INTRAVENOUS | Status: DC

## 2022-06-29 MED ORDER — PROPOFOL 500 MG/50ML IV EMUL
INTRAVENOUS | Status: DC | PRN
  Administered 2022-06-29: 150 ug/kg/min via INTRAVENOUS

## 2022-06-29 MED ORDER — PROPOFOL 10 MG/ML IV BOLUS
INTRAVENOUS | Status: DC | PRN
  Administered 2022-06-29 (×5): 20 mg via INTRAVENOUS

## 2022-06-29 MED ORDER — LIDOCAINE 2% (20 MG/ML) 5 ML SYRINGE
INTRAMUSCULAR | Status: DC | PRN
  Administered 2022-06-29: 100 mg via INTRAVENOUS

## 2022-06-29 MED ORDER — PANTOPRAZOLE SODIUM 40 MG PO TBEC
40.0000 mg | DELAYED_RELEASE_TABLET | Freq: Every day | ORAL | Status: DC
  Filled 2022-06-29: qty 1

## 2022-06-29 MED ORDER — POTASSIUM CHLORIDE 10 MEQ/100ML IV SOLN
10.0000 meq | INTRAVENOUS | Status: AC
  Administered 2022-06-29 (×4): 10 meq via INTRAVENOUS
  Filled 2022-06-29 (×4): qty 100

## 2022-06-29 MED ORDER — ONDANSETRON HCL 4 MG PO TABS: 4.0000 mg | ORAL_TABLET | Freq: Three times a day (TID) | ORAL | Status: DC

## 2022-06-29 MED ORDER — ONDANSETRON HCL 4 MG/2ML IJ SOLN
4.0000 mg | Freq: Three times a day (TID) | INTRAMUSCULAR | Status: DC
  Administered 2022-06-29 – 2022-06-30 (×3): 4 mg via INTRAVENOUS
  Filled 2022-06-29 (×3): qty 2

## 2022-06-29 MED ORDER — BOOST / RESOURCE BREEZE PO LIQD CUSTOM
1.0000 | Freq: Three times a day (TID) | ORAL | Status: DC
  Administered 2022-06-29 – 2022-06-30 (×2): 1 via ORAL

## 2022-06-29 MED ORDER — ONDANSETRON HCL 4 MG/2ML IJ SOLN
INTRAMUSCULAR | Status: DC | PRN
  Administered 2022-06-29: 4 mg via INTRAVENOUS

## 2022-06-29 MED ORDER — IOHEXOL 300 MG/ML  SOLN
75.0000 mL | Freq: Once | INTRAMUSCULAR | Status: AC | PRN
  Administered 2022-06-29: 75 mL via INTRAVENOUS

## 2022-06-29 MED ORDER — SODIUM CHLORIDE (PF) 0.9 % IJ SOLN
INTRAMUSCULAR | Status: AC
  Filled 2022-06-29: qty 50

## 2022-06-29 SURGICAL SUPPLY — 15 items

## 2022-06-29 NOTE — Anesthesia Postprocedure Evaluation (Signed)
Anesthesia Post Note  Patient: Chelsea Hansen  Procedure(s) Performed: ESOPHAGOGASTRODUODENOSCOPY (EGD) WITH PROPOFOL BIOPSY     Patient location during evaluation: Endoscopy Anesthesia Type: MAC Level of consciousness: awake and alert Pain management: pain level controlled Vital Signs Assessment: post-procedure vital signs reviewed and stable Respiratory status: spontaneous breathing, nonlabored ventilation, respiratory function stable and patient connected to nasal cannula oxygen Cardiovascular status: blood pressure returned to baseline and stable Postop Assessment: no apparent nausea or vomiting Anesthetic complications: no  No notable events documented.  Last Vitals:  Vitals:   06/29/22 1404 06/29/22 1419  BP: 112/66 125/75  Pulse: 89 69  Resp: 18 20  Temp:  36.9 C  SpO2: 97% 97%    Last Pain:  Vitals:   06/29/22 1419  TempSrc: Temporal  PainSc: 9                  Maha Fischel L Annasofia Pohl

## 2022-06-29 NOTE — Anesthesia Procedure Notes (Signed)
Procedure Name: MAC Date/Time: 06/29/2022 1:42 PM  Performed by: Eben Burow, CRNAPre-anesthesia Checklist: Patient identified, Emergency Drugs available, Suction available, Patient being monitored and Timeout performed Oxygen Delivery Method: Simple face mask Placement Confirmation: positive ETCO2

## 2022-06-29 NOTE — Assessment & Plan Note (Signed)
Maybe radiation esophagitis given h/o SBRT to chest early last year for metastatic breast CA? No h/o cirrhosis, normal liver numbers.  Not really any reason to suspect varices at this time. No tachycardia, no hypotension. No further hematemesis episodes since arrival to ED several hours ago. Empiric PPI bolus and drip IVF H/H Q6H EDP sent message to Dr. Fuller Plan for AM GI consult NPO

## 2022-06-29 NOTE — Transfer of Care (Signed)
Immediate Anesthesia Transfer of Care Note  Patient: Chelsea Hansen  Procedure(s) Performed: ESOPHAGOGASTRODUODENOSCOPY (EGD) WITH PROPOFOL BIOPSY  Patient Location: PACU and Endoscopy Unit  Anesthesia Type:MAC  Level of Consciousness: awake, alert , and patient cooperative  Airway & Oxygen Therapy: Patient Spontanous Breathing and Patient connected to face mask oxygen  Post-op Assessment: Report given to RN and Post -op Vital signs reviewed and stable  Post vital signs: Reviewed and stable  Last Vitals:  Vitals Value Taken Time  BP 112/66 06/29/22 1401  Temp    Pulse 90 06/29/22 1402  Resp 19 06/29/22 1402  SpO2 98 % 06/29/22 1402  Vitals shown include unvalidated device data.  Last Pain:  Vitals:   06/29/22 1231  TempSrc: Tympanic  PainSc: 0-No pain         Complications: No notable events documented.

## 2022-06-29 NOTE — ED Notes (Signed)
Patient placed on hospital bed for comfort °

## 2022-06-29 NOTE — Progress Notes (Addendum)
   Follow Up Note  HPI: Please see full H&P done earlier this morning Briefly 52 year old female with history significant for breast cancer with mets to lung/mediastinum, abdomen, bone currently on chemo and radiation presenting with 2 weeks of intractable nausea/vomiting.  Patient reported has been having daily intractable nausea and vomiting for the past 2 weeks with noted small amount of bleeding in vomitus.  Patient had a large onset of hematemesis and had a syncope event afterwards prior to coming to the ED.  Assessment/paln GI consulted, s/p EGD which showed mild Schatzki ring, congested and erythematous mucosa in the wall of the gastric body.  Several biopsies were obtained.  Possibility of forceful vomiting/Mallory-Weiss tear.  Recommending advance to clear liquid diet, continue once daily PPI, and antiemetic.  Noted mild hypokalemia, replaced.  Hemoglobin has remained fairly stable. Due to intractable nausea and vomiting, will be ordering CT head with and without contrast to evaluate for metastatic disease Added Dr. Lindi Adie, patient's oncologist to the treatment team   Dr Lindi Adie recommended to hold chemotherapy   Exam: General: NAD  Cardiovascular: S1, S2 present Respiratory: CTAB Abdomen: Soft, nontender, nondistended, bowel sounds present Musculoskeletal: No bilateral pedal edema noted Skin: Normal Psychiatry: Normal mood   Present on Admission:  Metastasis to bone [facility])  Hematemesis

## 2022-06-29 NOTE — Progress Notes (Signed)
Per Dr. Emilio Math patient's radiation treatment today and check on patient's status tomorrow to evaluate if/when to resume treatment.

## 2022-06-29 NOTE — Op Note (Addendum)
[facility] Patient Name: Chelsea Hansen Procedure Date: 06/29/2022 MRN: [id number] Attending MD: Estill Cotta. Loletha Carrow , MD, [id number] Date of Birth: [birth year] CSN: [id number] Age: 52 Admit Type: Emergency Department Procedure:                Upper GI endoscopy Indications:              Epigastric abdominal pain, Hematemesis, Nausea with                            vomiting Providers:                Mallie Mussel L. Loletha Carrow, MD, Jaci Carrel, RN, Cherylynn Ridges, Technician, Jefm Miles CRNA Referring MD:              Medicines:                Monitored Anesthesia Care Complications:            No immediate complications. Estimated Blood Loss:     Estimated blood loss was minimal. Procedure:                Pre-Anesthesia Assessment:                           - Prior to the procedure, a History and Physical                            was performed, and patient medications and                            allergies were reviewed. The patient's tolerance of                            previous anesthesia was also reviewed. The risks                            and benefits of the procedure and the sedation                            options and risks were discussed with the patient.                            All questions were answered, and informed consent                            was obtained. Prior Anticoagulants: The patient has                            taken no anticoagulant or antiplatelet agents. ASA                            Grade Assessment: III - A patient with severe  systemic disease. After reviewing the risks and                            benefits, the patient was deemed in satisfactory                            condition to undergo the procedure.                           After obtaining informed consent, the endoscope was                            passed under direct vision. Throughout the                             procedure, the patient's blood pressure, pulse, and                            oxygen saturations were monitored continuously. The                            [id number]-H190 ([id number] Olympus endoscope was introduced                            through the mouth, and advanced to the second part                            of duodenum. The upper GI endoscopy was                            accomplished without difficulty. The patient                            tolerated the procedure well. Scope In: Scope Out: Findings:      The larynx was normal. No candida seen on oropharynx or hypopharynx.      A mild Schatzki ring was found at the gastroesophageal junction.      A 2 cm hiatal hernia was present.      There is no endoscopic evidence of Barrett's esophagus, esophagitis or       mucosal abnormalities in the entire esophagus. No candida seen.      Localized moderate mucosal changes characterized by congestion and       erythema were found on the anterior and posterior walls of the proximal       gastric body and fundus. Several biopsies were obtained in the gastric       body and in the gastric antrum with cold forceps for histology.      The exam of the stomach was otherwise normal.      The examined duodenum was normal. Impression:               - Normal larynx.                           - Mild Schatzki ring.                           -  2 cm hiatal hernia.                           - Congested and erythematous mucosa in the anterior                            wall of the gastric body and posterior wall of the                            gastric body.                           - Normal examined duodenum.                           - Several biopsies were obtained in the gastric                            body and in the gastric antrum.                           No high grade source of GI bleeding. Must have been                            from gastritis/forceful vomiting. No obstruction on                             CTAP or EGD.                           Gastric findings appear to be the result of rather                            than the cause of vomiting. Moderate Sedation:      MAC sedation used Recommendation:           - Patient has a contact number available for                            emergencies. The signs and symptoms of potential                            delayed complications were discussed with the                            patient. Return to normal activities tomorrow.                            Written discharge instructions were provided to the                            patient.                           - Advance diet as tolerated and clear liquid diet.                           -  Standing and alternating doses of ondansetron and                            metoclopramide.(check EKG for QTc if not done                            recently).                           Stop protonix drip                           Once daily PPI is sufficient.                           Consider contrast head CT to rule out intracranial                            metastasis contributing to nausea and vomiting.                           -Further inpatient observation to get vomiting                            under control. Procedure Code(s):        --- Professional ---                           [id number], Esophagogastroduodenoscopy, flexible,                            transoral; with biopsy, single or multiple Diagnosis Code(s):        --- Professional ---                           K22.2, Esophageal obstruction                           K44.9, Diaphragmatic hernia without obstruction or                            gangrene                           K31.89, Other diseases of stomach and duodenum                           R10.13, Epigastric pain                           K92.0, Hematemesis                           R11.2, Nausea with vomiting, unspecified CPT copyright 2022 [employer]  [employer] Association. All rights reserved. The codes documented in this report are preliminary and upon coder review may  be revised to meet current compliance requirements. Endiya Klahr L. Loletha Carrow, MD 06/29/2022 2:05:03 PM This report has been signed electronically. Number of Addenda:  0 

## 2022-06-29 NOTE — Anesthesia Preprocedure Evaluation (Addendum)
Anesthesia Evaluation  Patient identified by MRN, date of birth, ID band Patient awake    Reviewed: Allergy & Precautions, NPO status , Patient's Chart, lab work & pertinent test results, reviewed documented beta blocker date and time   Airway Mallampati: II  TM Distance: >3 FB Neck ROM: Full    Dental  (+) Chipped, Dental Advisory Given, Poor Dentition,    Pulmonary asthma    Pulmonary exam normal breath sounds clear to auscultation       Cardiovascular hypertension, Pt. on medications and Pt. on home beta blockers + DVT  Normal cardiovascular exam Rhythm:Regular Rate:Normal     Neuro/Psych  Headaches PSYCHIATRIC DISORDERS Anxiety Depression       GI/Hepatic Neg liver ROS,GERD  Medicated,,  Endo/Other  negative endocrine ROS    Renal/GU negative Renal ROS  negative genitourinary   Musculoskeletal negative musculoskeletal ROS (+)    Abdominal   Peds  Hematology  (+) Blood dyscrasia, anemia Lab Results      Component                Value               Date                      WBC                      3.8 (L)             06/28/2022                HGB                      9.2 (L)             06/29/2022                HCT                      30.1 (L)            06/29/2022                MCV                      77.5 (L)            06/28/2022                PLT                      333                 06/28/2022              Anesthesia Other Findings No emesis today  Reproductive/Obstetrics Breast CA with spine mets on chemo/radiation                              Anesthesia Physical Anesthesia Plan  ASA: 3  Anesthesia Plan: MAC   Post-op Pain Management:    Induction: Intravenous  PONV Risk Score and Plan: Propofol infusion and Treatment may vary due to age or medical condition  Airway Management Planned: Natural Airway  Additional Equipment:   Intra-op Plan:    Post-operative Plan:   Informed Consent: I have reviewed the patients History and Physical, chart, labs and  discussed the procedure including the risks, benefits and alternatives for the proposed anesthesia with the patient or authorized representative who has indicated his/her understanding and acceptance.     Dental advisory given  Plan Discussed with: CRNA  Anesthesia Plan Comments:        Anesthesia Quick Evaluation

## 2022-06-29 NOTE — Consult Note (Addendum)
Consultation  Referring Provider: [facility]/ Horris Latino Primary Care Physician:  [name], NP Primary Gastroenterologist:  Dr.Stark  Reason for Consultation:   hematemesis, in pt with metastatic breast cancer  HPI: Chelsea Hansen is a 52 y.o. female, known to Dr. Fuller Plan from prior screening colonoscopy done in May 2022.  She had one 7 mm polyp removed from the transverse colon (hyperplastic) and was noted to have multiple diverticuli.  She has not had prior EGD. Unfortunately she was diagnosed with breast cancer in June 2022 stage IIa and underwent a course of chemotherapy in the fall 2022 followed by right lumpectomy in January 2023 and then radiation which started in February 2023. She developed severe mid and lower back pain fall 2023 and underwent repeat imaging with CT of the chest showing multiple bony metastases in the thoracic spine, ribs sacrum and pelvis.  No visceral metastatic disease noted at that time.  She was started on anastrozole and Verzenio.  She then started radiation to her lumbosacral spine within the past week Per oncology notes she did have persistent nausea and vomiting after starting anastrozole and Verzenio the dose of Verzenio was decreased patient says over the past couple of weeks she has continued to have very frequent nausea and vomiting and has also developed heartburn and an irritated burning sensation in her throat/chest. She did have PET/CT on 06/02/2022 which showed no abnormal FDG avidity associated with multifocal sclerotic and mixed lytic/sclerotic osseous lesions throughout the axial and proximal appendicular skeleton, no evidence of hypermetabolic soft tissue metastatic disease. Yesterday morning after she went to work she developed vomiting and says she vomited up a large amount of dark red blood and some clots.  She continued to have vomiting over the next couple of hours with similar dark red blood and clots.  She had a syncopal episode, and was  brought to the emergency room.  She has been hemodynamically stable here, says she had 1 other episode of very small volume hematemesis last night but has not had any further vomiting since.  She says she has not had any bowel movements in the past several days has been quite constipated. No aspirin or NSAID use.  She says her whole abdomen has been sore and uncomfortable over this past week to week and a half. Repeat imaging last night with CT of the abdomen and pelvis shows numerous bone mets and a stable L3 fracture, no evidence of pulmonary embolus, and no evidence of intra-abdominal metastatic disease.  On arrival hemoglobin 10.8/hematocrit 34.8/MCV 77/WBC 3.8/platelets 333 Potassium 3.4 BUN 9/creatinine 0.83, LFTs within normal limits INR 1.1. Hemoglobin has drifted since arrival down to 9.8 last p.m. and then 9.2 early this morning.  She is receiving IV potassium, and is on a PPI infusion     Past Medical History:  Diagnosis Date   Allergy    seasonal allergies   Anxiety    on meds   Asthma    uses inhaler   Breast cancer ([place]) 2012   RIGHT lumpectomy   Cancer ([place]) 2022   RIGHT breast lump-dx 2022   Depression    on meds   DVT (deep venous thrombosis) ([place]) 2010   after hysterectomy   Family history of uterine cancer    GERD (gastroesophageal reflux disease)    with certain foods/OTC PRN meds   Headache(784.0)    History of radiation therapy    Bilateral breast- 09/03/21-11/02/21- Dr. [name]   Hyperlipidemia  on meds   Hypertension    on meds   SVT (supraventricular tachycardia)     Past Surgical History:  Procedure Laterality Date   ABDOMINAL HYSTERECTOMY     AXILLARY SENTINEL NODE BIOPSY Left 07/09/2021   Procedure: LEFT AXILLARY SENTINEL NODE BIOPSY;  Surgeon: [name], MD;  Location: [facility];  Service: General;  Laterality: Left;   BREAST EXCISIONAL BIOPSY Right 09/16/2009   BREAST LUMPECTOMY WITH RADIOACTIVE SEED  LOCALIZATION Bilateral 07/09/2021   Procedure: BILATERAL BREAST LUMPECTOMY WITH RADIOACTIVE SEED LOCALIZATION;  Surgeon: [name], MD;  Location: [facility];  Service: General;  Laterality: Bilateral;   BREAST SURGERY     lumpectomy   PORT-A-CATH REMOVAL Left 07/09/2021   Procedure: REMOVAL PORT-A-CATH;  Surgeon: [name], MD;  Location: [facility];  Service: General;  Laterality: Left;   PORTACATH PLACEMENT Left 01/26/2021   Procedure: INSERTION PORT-A-CATH;  Surgeon: [name], MD;  Location: [facility];  Service: General;  Laterality: Left;   RADIOACTIVE SEED GUIDED AXILLARY SENTINEL LYMPH NODE Right 07/09/2021   Procedure: RADIOACTIVE SEED GUIDED RIGHT AXILLARY SENTINEL LYMPH NODE DISSECTION;  Surgeon: [name], MD;  Location: [facility];  Service: General;  Laterality: Right;   TUBAL LIGATION      Prior to Admission medications   Medication Sig Start Date End Date Taking? Authorizing Provider  abemaciclib (VERZENIO) 50 MG tablet Take 1 tablet (50 mg total) by mouth 2 (two) times daily. 05/25/22   [name], MD  albuterol (VENTOLIN HFA) 108 (90 Base) MCG/ACT inhaler Inhale 2 puffs by mouth into the lungs every 6 hours as needed for wheezing or shortness of breath. 11/04/21   [name], NP  amLODipine (NORVASC) 10 MG tablet Take 1 tablet  by mouth daily. 07/13/21   [name], NP  anastrozole (ARIMIDEX) 1 MG tablet Take 1 tablet (1 mg total) by mouth daily. 03/22/22   [name], NP  ARIPiprazole (ABILIFY) 15 MG tablet Take 1 tablet by mouth at bedtime 08/02/21     calcium-vitamin D (OSCAL WITH D) 500-5 MG-MCG tablet Take 1 tablet by mouth daily with supper. 06/24/22   [name], MD  escitalopram (LEXAPRO) 20 MG tablet Take 1 tablet (20 mg total) by mouth daily. 02/20/21   [name], NP  hydrochlorothiazide (HYDRODIURIL) 25 MG tablet Take 1 tablet by mouth daily. 01/22/22   [name], NP  montelukast (SINGULAIR) 10 MG tablet Take 1 tablet (10 mg total) by mouth at bedtime. 02/20/21   [name], NP  oxyCODONE ER 9 MG C12A Take 1 capsule by mouth 2 times daily. 06/24/22   [name], Carlena Sax, NP  oxyCODONE-acetaminophen (PERCOCET) 7.5-325 MG tablet Take 1 - 2 tablets by mouth every 6 (six) hours as needed for severe pain. 06/24/22   [name], Carlena Sax, NP  pantoprazole (PROTONIX) 40 MG tablet Take 1 tablet (40 mg total) by mouth daily. 05/24/22   [name], NP  prochlorperazine (COMPAZINE) 10 MG tablet Take 1 tablet (10 mg total) by mouth every 6 (six) hours as needed for nausea or vomiting. 06/18/22   Causey, Charlestine Massed, NP  promethazine (PHENERGAN) 25 MG suppository Place 1 suppository (25 mg total) rectally every 6 (six) hours as needed for up to 3 days for nausea or vomiting. 05/25/22 05/29/22  Walisiewicz, Harley Hallmark, PA-C  propranolol (INDERAL) 10 MG tablet Take 1 tablet (10 mg total) by mouth 2 (two) times daily. 04/28/21  [name], [name], DO  valsartan (DIOVAN) 40 MG tablet Take 1 tablet (40 mg total) by mouth daily. 07/27/21   [name], NP    Current Facility-Administered Medications  Medication Dose Route Frequency Provider Last Rate Last Admin   acetaminophen (TYLENOL) tablet 650 mg  650 mg Oral Q6H PRN Etta Quill, DO       Or   acetaminophen (TYLENOL) suppository 650 mg  650 mg Rectal Q6H PRN Etta Quill, DO       HYDROmorphone (DILAUDID) injection 0.5-1 mg  0.5-1 mg Intravenous Q2H PRN Etta Quill, DO       lactated ringers infusion   Intravenous Continuous Etta Quill, DO 125 mL/hr at 06/29/22 0205 New Bag at 06/29/22 0205   ondansetron (ZOFRAN) tablet 4 mg  4 mg Oral Q6H PRN Etta Quill, DO       Or   ondansetron [facility]) injection 4 mg  4 mg Intravenous Q6H PRN Etta Quill, DO       [START ON 07/02/2022] pantoprazole (PROTONIX) injection 40 mg  40 mg Intravenous Q12H Jennette Kettle M, DO       pantoprozole (PROTONIX) 80 mg /NS 100 mL infusion  8 mg/hr Intravenous Continuous Jennette Kettle M, DO 10 mL/hr at 06/29/22 0215 8 mg/hr at 06/29/22 0215   potassium chloride 10 mEq in 100 mL IVPB  10 mEq Intravenous Q1 Hr x 4 Alma Friendly, MD 100 mL/hr at 06/29/22 0744 10 mEq at 06/29/22 0744   Current Outpatient Medications  Medication Sig Dispense Refill   abemaciclib (VERZENIO) 50 MG tablet Take 1 tablet (50 mg total) by mouth 2 (two) times daily. 60 tablet 3   albuterol (VENTOLIN HFA) 108 (90 Base) MCG/ACT inhaler Inhale 2 puffs by mouth into the lungs every 6 hours as needed for wheezing or shortness of breath. 18 g 2   amLODipine (NORVASC) 10 MG tablet Take 1 tablet  by mouth daily. 90 tablet 1   anastrozole (ARIMIDEX) 1 MG tablet Take 1 tablet (1 mg total) by mouth daily. 90 tablet 3   ARIPiprazole (ABILIFY) 15 MG tablet Take 1 tablet by mouth at bedtime 30 tablet 0   calcium-vitamin D (OSCAL WITH D) 500-5 MG-MCG tablet Take 1 tablet by mouth daily with supper. 90 tablet 2   escitalopram (LEXAPRO) 20 MG tablet Take 1 tablet (20 mg total) by mouth daily. 90 tablet 1   hydrochlorothiazide (HYDRODIURIL) 25 MG tablet Take 1 tablet by mouth daily. 30 tablet 0   montelukast (SINGULAIR) 10 MG tablet Take 1 tablet (10 mg total) by mouth at bedtime. 90 tablet 3   oxyCODONE ER 9 MG C12A Take 1 capsule by mouth 2 times daily. 30 capsule 0   oxyCODONE-acetaminophen (PERCOCET) 7.5-325 MG tablet Take 1 - 2 tablets by mouth every 6 (six) hours as needed for severe pain. 102 tablet 0   pantoprazole (PROTONIX) 40 MG tablet Take 1 tablet (40 mg total) by mouth daily. 30 tablet 0   prochlorperazine (COMPAZINE) 10 MG tablet Take 1 tablet (10 mg total) by mouth every 6 (six) hours as needed for nausea or vomiting. 30 tablet 0   promethazine (PHENERGAN) 25 MG suppository Place 1 suppository (25 mg total) rectally every 6 (six) hours as needed for up to 3 days for nausea or vomiting. 12  suppository 0   propranolol (INDERAL) 10 MG tablet Take 1 tablet (10 mg total) by mouth 2 (two) times daily. 180 tablet 3  valsartan (DIOVAN) 40 MG tablet Take 1 tablet (40 mg total) by mouth daily. 90 tablet 3   Facility-Administered Medications Ordered in Other Encounters  Medication Dose Route Frequency Provider Last Rate Last Admin   0.9 %  sodium chloride infusion   Intravenous Continuous Causey, Charlestine Massed, NP   Stopped at 06/24/22 1309    Allergies as of 06/28/2022   (No Known Allergies)    Family History  Problem Relation Age of Onset   Breast cancer Mother        26s   Hypertension Father    Uterine cancer Maternal Aunt    Ovarian cancer Maternal Aunt    Breast cancer Maternal Aunt        20s   Breast cancer Cousin 51   Uterine cancer Cousin 80   Colon polyps Neg Hx    Colon cancer Neg Hx    Esophageal cancer Neg Hx    Stomach cancer Neg Hx     Social History   Socioeconomic History   Marital status: Single    Spouse name: Not on file   Number of children: Not on file   Years of education: Not on file   Highest education level: Not on file  Occupational History   Not on file  Tobacco Use   Smoking status: Never   Smokeless tobacco: Never  Vaping Use   Vaping Use: Never used  Substance and Sexual Activity   Alcohol use: Not Currently    Alcohol/week: 7.0 standard drinks of alcohol    Types: 7 Standard drinks or equivalent per week    Comment: occassional   Drug use: No   Sexual activity: Not Currently  Other Topics Concern   Not on file  Social History Narrative   Not on file   Social Determinants of Health   Financial Resource Strain: High Risk (01/14/2022)   Overall Financial Resource Strain (CARDIA)    Difficulty of Paying Living Expenses: Very hard  Food Insecurity: Food Insecurity Present (01/14/2022)   Hunger Vital Sign    Worried About Running Out of Food in the Last Year: Sometimes true    Ran Out of Food in the Last Year:  Sometimes true  Transportation Needs: No Transportation Needs (12/02/2020)   PRAPARE - Hydrologist (Medical): No    Lack of Transportation (Non-Medical): No  Physical Activity: Not on file  Stress: Not on file  Social Connections: Not on file  Intimate Partner Violence: Not on file    Review of Systems: Pertinent positive and negative review of systems were noted in the above HPI section.  All other review of systems was otherwise negative.   Physical Exam: Vital signs in last 24 hours: Temp:  [98.1 F (36.7 C)-98.2 F (36.8 C)] 98.1 F (36.7 C) (01/09 0646) Pulse Rate:  [51-66] 64 (01/09 0800) Resp:  [10-25] 19 (01/09 0800) BP: (100-149)/(59-99) 111/79 (01/09 0800) SpO2:  [95 %-100 %] 97 % (01/09 0800) Weight:  [98.9 kg] 98.9 kg (01/08 1852)   General:   Alert,  Well-developed, well-nourished, older African-American female pleasant and cooperative in NAD-family member at bedside-complaining of burning in her left upper extremity Head:  Normocephalic and atraumatic. Eyes:  Sclera clear, no icterus.   Conjunctiva pink. Ears:  Normal auditory acuity. Nose:  No deformity, discharge,  or lesions. Mouth:  No deformity or lesions.   Neck:  Supple; no masses or thyromegaly. Lungs:  Clear throughout to auscultation.   No wheezes,  crackles, or rhonchi. Heart:  Regular rate and rhythm; no murmurs, clicks, rubs,  or gallops. Abdomen:  Soft, some mild rather diffuse tenderness, and some tenderness in the epigastrium, nonfocal no guarding or rebound BS active,nonpalp mass or hsm.   Rectal: Not done Msk:  Symmetrical without gross deformities. . Pulses:  Normal pulses noted. Extremities:  Without clubbing or edema. Neurologic:  Alert and  oriented x4;  grossly normal neurologically. Skin:  Intact without significant lesions or rashes.. Psych:  Alert and cooperative. Normal mood and affect.  Intake/Output from previous day: 01/08 0701 - 01/09 0700 In: 1150  [IV Piggyback:1150] Out: -  Intake/Output this shift: No intake/output data recorded.  Lab Results: Recent Labs    06/28/22 2010 06/29/22 0206 06/29/22 0749  WBC 3.8*  --   --   HGB 10.8* 9.8* 9.2*  HCT 34.8* 31.4* 30.1*  PLT 333  --   --    BMET Recent Labs    06/28/22 2010 06/29/22 0533  NA 134* 136  K 3.7 3.4*  CL 103 108  CO2 23 22  GLUCOSE 90 90  BUN 9 8  CREATININE 0.83 0.70  CALCIUM 8.0* 7.3*   LFT Recent Labs    06/28/22 2010  PROT 8.1  ALBUMIN 4.4  AST 17  ALT 17  ALKPHOS 102  BILITOT 0.5   PT/INR Recent Labs    06/28/22 2010  LABPROT 13.9  INR 1.1      IMPRESSION:  #32 52 year old African-American female presenting to the emergency room yesterday with acute onset of hematemesis yesterday morning, had several episodes prior to arrival to the emergency room and a reported syncopal episode.  Patient reports dark red blood and clots. No prior history of GI bleeding. She has been having abdominal discomfort and nausea and vomiting over the past couple of weeks, in setting of chemotherapy regimen with anastrozole and Verzenio for metastatic breast cancer.  She had a dosage decrease in Verzenio but says she is continue to have frequent nausea and vomiting  Etiology of acute GI bleed is not clear, rule out ulcerative esophagitis, ulcerative gastropathy, neoplasm, Mallory-Weiss tear  #2 anemia microcytic acute on chronic secondary to acute GI blood loss-has not required transfusion but has had a 2 g drop in hemoglobin since arrival.  #3 metastatic breast cancer, initial diagnosis June 2022 stage IIa, completed chemotherapy, then lumpectomy January 2023 followed by radiation. Developed mid and low back pain fall 2023 and found to have diffuse osseous metastatic disease for which she is now on anastrozole and Verzenio. She just started radiation to her lumbosacral spine, has not had any recent radiation to the chest or abdomen.  #4 mild  hypokalemia-replacing IV   Plan; n.p.o. PPI infusion Every 6 hour hemoglobins and transfuse for hemoglobin less than 8.  Transfusion was discussed with the patient including indications and risk. Will plan for upper endoscopy earlier this afternoon with Dr. Loletha Carrow Procedure was discussed in detail with the patient including indications risks and benefits and she is agreeable to proceed. Further recommendations pending results at EGD    [place] PA-C 06/29/2022, 8:43 AM  I have taken an interval history, thoroughly reviewed the chart and examined the patient. I agree with the Advanced Practitioner's note, impression and recommendations, and have recorded additional findings, impressions and recommendations below. I performed a substantive portion of this encounter (>50% time spent), including a complete performance of the medical decision making.  My additional thoughts are as follows:  52 year old woman with  metastatic breast cancer has developed persistent nausea and vomiting progressing to hematemesis yesterday and overnight. No chronic aspirin or NSAID use. Skeletal metastatic disease, no obstruction or evidence of abdominal metastatic disease on CT scan. No focal neurologic symptoms reported, however please consider imaging of the head to rule out intracranial metastatic disease. I am concerned that the nausea and vomiting are worsening side effects of her chemotherapy and/or radiation treatment, and she may have developed peptic ulcer or Mallory-Weiss tear from the vomiting.  Upper endoscopy today to evaluate nausea vomiting to rule out obstruction, ulcer, malignancy.  She also has throat discomfort that could indicate candidiasis.  Currently hemodynamically stable, bleeding seems to have significantly slowed or stopped at this point.  Risks of an upper endoscopy reviewed and she was agreeable.  The benefits and risks of the planned procedure were described in detail with the  patient or (when appropriate) their health care proxy.  Risks were outlined as including, but not limited to, bleeding, infection, perforation, adverse medication reaction leading to cardiac or pulmonary decompensation, pancreatitis (if ERCP).  The limitation of incomplete mucosal visualization was also discussed.  No guarantees or warranties were given.  We will plan to put her on standing doses of ondansetron and metoclopramide while hospitalized for her persistent nausea.   Nelida Meuse III Office:[phone]

## 2022-06-29 NOTE — H&P (Signed)
History and Physical    Patient: Chelsea Hansen DXI:[id number] DOB: Jan 19, 1971 DOA: 06/28/2022 DOS: the patient was seen and examined on 06/29/2022 PCP: [name], NP  Patient coming from: Home  Chief Complaint:  Chief Complaint  Patient presents with   Cancer patient   Hematemesis   Loss of Consciousness   Constipation   HPI: Chelsea Hansen is a 52 y.o. 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  Pt on PO Chemo currently as well as current radiation to pelvis.  Pt has already done radiation to chest / mediastinum early 2023.  Pt with onset of hematemesis, large volume of BRB earlier today.  Had syncope after this.  Pt tried eating some food after and vomited more blood.  Not on blood thinners.  Never had anything like this happen before.    Review of Systems: As mentioned in the history of present illness. All other systems reviewed and are negative. Past Medical History:  Diagnosis Date   Allergy    seasonal allergies   Anxiety    on meds   Asthma    uses inhaler   Breast cancer ([facility]) 2012   RIGHT lumpectomy   Cancer ([place]) 2022   RIGHT breast lump-dx 2022   Depression    on meds   DVT (deep venous thrombosis) ([place] [facility]) 2010   after hysterectomy   Family history of uterine cancer    GERD (gastroesophageal reflux disease)    with certain foods/OTC PRN meds   Headache(784.0)    History of radiation therapy    Bilateral breast- 09/03/21-11/02/21- Dr. [name]   Hyperlipidemia    on meds   Hypertension    on meds   SVT (supraventricular tachycardia)    Past Surgical History:  Procedure Laterality Date   ABDOMINAL HYSTERECTOMY     AXILLARY SENTINEL NODE BIOPSY Left 07/09/2021   Procedure: LEFT AXILLARY SENTINEL NODE BIOPSY;  Surgeon: [name], MD;  Location: [place];  Service: General;  Laterality: Left;   BREAST EXCISIONAL BIOPSY Right 09/16/2009   BREAST LUMPECTOMY  WITH RADIOACTIVE SEED LOCALIZATION Bilateral 07/09/2021   Procedure: BILATERAL BREAST LUMPECTOMY WITH RADIOACTIVE SEED LOCALIZATION;  Surgeon: [name], MD;  Location: [place];  Service: General;  Laterality: Bilateral;   BREAST SURGERY     lumpectomy   PORT-A-CATH REMOVAL Left 07/09/2021   Procedure: REMOVAL PORT-A-CATH;  Surgeon: [name], MD;  Location: [place];  Service: General;  Laterality: Left;   PORTACATH PLACEMENT Left 01/26/2021   Procedure: INSERTION PORT-A-CATH;  Surgeon: [name], MD;  Location: [facility];  Service: General;  Laterality: Left;   RADIOACTIVE SEED GUIDED AXILLARY SENTINEL LYMPH NODE Right 07/09/2021   Procedure: RADIOACTIVE SEED GUIDED RIGHT AXILLARY SENTINEL LYMPH NODE DISSECTION;  Surgeon: [name], MD;  Location: [place];  Service: General;  Laterality: Right;   TUBAL LIGATION     Social History:  reports that she has never smoked. She has never used smokeless tobacco. She reports that she does not currently use alcohol after a past usage of about 7.0 standard drinks of alcohol per week. She reports that she does not use drugs.  No Known Allergies  Family History  Problem Relation Age of Onset   Breast cancer Mother        34s   Hypertension Father    Uterine cancer Maternal Aunt    Ovarian cancer Maternal Aunt  Breast cancer Maternal Aunt        35s   Breast cancer Cousin 80   Uterine cancer Cousin 50   Colon polyps Neg Hx    Colon cancer Neg Hx    Esophageal cancer Neg Hx    Stomach cancer Neg Hx     Prior to Admission medications   Medication Sig Start Date End Date Taking? Authorizing Provider  abemaciclib (VERZENIO) 50 MG tablet Take 1 tablet (50 mg total) by mouth 2 (two) times daily. 05/25/22   [name], MD  albuterol (VENTOLIN HFA) 108 (90 Base) MCG/ACT inhaler Inhale 2 puffs by mouth into the lungs every 6 hours as needed for wheezing or shortness of  breath. 11/04/21   [name], NP  amLODipine (NORVASC) 10 MG tablet Take 1 tablet  by mouth daily. 07/13/21   [name], NP  anastrozole (ARIMIDEX) 1 MG tablet Take 1 tablet (1 mg total) by mouth daily. 03/22/22   [name], NP  ARIPiprazole (ABILIFY) 15 MG tablet Take 1 tablet by mouth at bedtime 08/02/21     calcium-vitamin D (OSCAL WITH D) 500-5 MG-MCG tablet Take 1 tablet by mouth daily with supper. 06/24/22   [name], MD  escitalopram (LEXAPRO) 20 MG tablet Take 1 tablet (20 mg total) by mouth daily. 02/20/21   [name], NP  hydrochlorothiazide (HYDRODIURIL) 25 MG tablet Take 1 tablet by mouth daily. 01/22/22   [name], NP  montelukast (SINGULAIR) 10 MG tablet Take 1 tablet (10 mg total) by mouth at bedtime. 02/20/21   [name], NP  oxyCODONE ER 9 MG C12A Take 1 capsule by mouth 2 times daily. 06/24/22   [name], Carlena Sax, NP  oxyCODONE-acetaminophen (PERCOCET) 7.5-325 MG tablet Take 1 - 2 tablets by mouth every 6 (six) hours as needed for severe pain. 06/24/22   [name], Carlena Sax, NP  pantoprazole (PROTONIX) 40 MG tablet Take 1 tablet (40 mg total) by mouth daily. 05/24/22   [name], NP  prochlorperazine (COMPAZINE) 10 MG tablet Take 1 tablet (10 mg total) by mouth every 6 (six) hours as needed for nausea or vomiting. 06/18/22   Causey, Charlestine Massed, NP  promethazine (PHENERGAN) 25 MG suppository Place 1 suppository (25 mg total) rectally every 6 (six) hours as needed for up to 3 days for nausea or vomiting. 05/25/22 05/29/22  Walisiewicz, Harley Hallmark, PA-C  propranolol (INDERAL) 10 MG tablet Take 1 tablet (10 mg total) by mouth 2 (two) times daily. 04/28/21   [name], [name], DO  valsartan (DIOVAN) 40 MG tablet Take 1 tablet (40 mg total) by mouth daily. 07/27/21   [name], NP    Physical Exam: Vitals:   06/28/22 2215 06/28/22 2245 06/28/22 2300 06/28/22 2330  BP: 139/86 117/75 110/73 (!)  121/91  Pulse: (!) 51 (!) 54 (!) 58 60  Resp: '14 16 16 18  '$ Temp:    98.2 F (36.8 C)  TempSrc:      SpO2: 98% 98% 99% 99%  Weight:      Height:       Constitutional: NAD, calm, comfortable Eyes: PERRL, lids and conjunctivae normal ENMT: Mucous membranes are dry. Posterior pharynx clear of any exudate or lesions.Normal dentition.  Neck: normal, supple, no masses, no thyromegaly Respiratory: clear to auscultation bilaterally, no wheezing, no crackles. Normal respiratory effort. No accessory muscle use.  Cardiovascular: Regular rate and rhythm, no tachycardia at time of my exam, Abdomen: no tenderness, no masses  palpated. No hepatosplenomegaly. Bowel sounds positive.  Musculoskeletal: no clubbing / cyanosis. No joint deformity upper and lower extremities. Good ROM, no contractures. Normal muscle tone.  Skin: no rashes, lesions, ulcers. No induration Neurologic: CN 2-12 grossly intact. Sensation intact, DTR normal. Strength 5/5 in all 4.  Psychiatric: Normal judgment and insight. Alert and oriented x 3. Normal mood.   Data Reviewed:    CT CAP: IMPRESSION: 1. No pulmonary embolism. No acute intrathoracic pathology identified. 2. No acute intra-abdominal pathology identified. No definite radiographic explanation for the patient's reported symptoms. 3. Moderate to severe pancolonic diverticulosis without superimposed acute inflammatory change. 4. Diffuse osseous metastatic disease with stable pathologic compression fracture of L3.     Latest Ref Rng & Units 06/29/2022    2:06 AM 06/28/2022    8:10 PM 06/24/2022    8:32 AM  CBC  WBC 4.0 - 10.5 K/uL  3.8  3.3   Hemoglobin 12.0 - 15.0 g/dL 9.8  10.8  10.7   Hematocrit 36.0 - 46.0 % 31.4  34.8  33.1   Platelets 150 - 400 K/uL  333  293        Latest Ref Rng & Units 06/28/2022    8:10 PM 06/24/2022    8:32 AM 06/08/2022   10:41 AM  CMP  Glucose 70 - 99 mg/dL 90  99  93   BUN 6 - 20 mg/dL '9  12  15   '$ Creatinine 0.44 - 1.00 mg/dL 0.83   1.01  1.06   Sodium 135 - 145 mmol/L 134  139  141   Potassium 3.5 - 5.1 mmol/L 3.7  3.6  3.4   Chloride 98 - 111 mmol/L 103  104  105   CO2 22 - 32 mmol/L '23  29  26   '$ Calcium 8.9 - 10.3 mg/dL 8.0  8.4  9.5   Total Protein 6.5 - 8.1 g/dL 8.1  6.7  8.0   Total Bilirubin 0.3 - 1.2 mg/dL 0.5  0.4  0.6   Alkaline Phos 38 - 126 U/L 102  110  109   AST 15 - 41 U/L '17  14  19   '$ ALT 0 - 44 U/L '17  12  20      '$ Assessment and Plan: * Hematemesis Maybe radiation esophagitis given h/o SBRT to chest early last year for metastatic breast CA? No h/o cirrhosis, normal liver numbers.  Not really any reason to suspect varices at this time. No tachycardia, no hypotension. No further hematemesis episodes since arrival to ED several hours ago. Empiric PPI bolus and drip IVF H/H Q6H EDP sent message to Dr. Fuller Plan for AM GI consult NPO  Malignant neoplasm of upper-outer quadrant of right breast in female, estrogen receptor positive ([facility]) Breast CA with diffuse bone mets. Dilaudid IV PRN pain for the moment since doesn't sound like she's going to be able to take POs for the moment (hematemesis when ever she tries to eat or drink anything). Having to hold verzenio and Arimidex as well for the moment      Advance Care Planning:   Code Status: Full Code  Consults: EDP sent message to Dr. Fuller Plan  Family Communication: Family at bedside  Severity of Illness: The appropriate patient status for this patient is OBSERVATION. Observation status is judged to be reasonable and necessary in order to provide the required intensity of service to ensure the patient's safety. The patient's presenting symptoms, physical exam findings, and initial radiographic and laboratory data in  the context of their medical condition is felt to place them at decreased risk for further clinical deterioration. Furthermore, it is anticipated that the patient will be medically stable for discharge from the hospital within 2 midnights of  admission.   Author: Etta Quill., DO 06/29/2022 1:43 AM  For on call review [email or web address].

## 2022-06-30 ENCOUNTER — Other Ambulatory Visit (HOSPITAL_BASED_OUTPATIENT_CLINIC_OR_DEPARTMENT_OTHER): Payer: Self-pay

## 2022-06-30 ENCOUNTER — Ambulatory Visit
Admission: RE | Admit: 2022-06-30 | Discharge: 2022-06-30 | Disposition: A | Discharge status: Home / Self Care | Payer: Medicaid Other | Source: Ambulatory Visit | Attending: Radiation Oncology | Admitting: Radiation Oncology

## 2022-06-30 ENCOUNTER — Other Ambulatory Visit (HOSPITAL_COMMUNITY): Payer: Self-pay

## 2022-06-30 ENCOUNTER — Ambulatory Visit: Payer: Medicaid Other

## 2022-06-30 ENCOUNTER — Other Ambulatory Visit: Payer: Self-pay

## 2022-06-30 ENCOUNTER — Encounter: Payer: Self-pay | Admitting: Hematology and Oncology

## 2022-06-30 DIAGNOSIS — K5900 Constipation, unspecified: Secondary | ICD-10-CM

## 2022-06-30 DIAGNOSIS — R112 Nausea with vomiting, unspecified: Secondary | ICD-10-CM

## 2022-06-30 DIAGNOSIS — C50411 Malignant neoplasm of upper-outer quadrant of right female breast: Secondary | ICD-10-CM | POA: Diagnosis not present

## 2022-06-30 DIAGNOSIS — I10 Essential (primary) hypertension: Secondary | ICD-10-CM | POA: Diagnosis not present

## 2022-06-30 DIAGNOSIS — C50919 Malignant neoplasm of unspecified site of unspecified female breast: Secondary | ICD-10-CM

## 2022-06-30 DIAGNOSIS — K920 Hematemesis: Secondary | ICD-10-CM | POA: Diagnosis not present

## 2022-06-30 DIAGNOSIS — K5909 Other constipation: Secondary | ICD-10-CM | POA: Diagnosis not present

## 2022-06-30 LAB — RAD ONC ARIA SESSION SUMMARY
Course Elapsed Days: 7
Plan Fractions Treated to Date: 5
Plan Prescribed Dose Per Fraction: 3 Gy
Plan Total Fractions Prescribed: 10
Plan Total Prescribed Dose: 30 Gy
Reference Point Dosage Given to Date: 15 Gy
Reference Point Session Dosage Given: 3 Gy
Session Number: 5

## 2022-06-30 LAB — CBC
HCT: 31.1 % — ABNORMAL LOW (ref 36.0–46.0)
Hemoglobin: 9.9 g/dL — ABNORMAL LOW (ref 12.0–15.0)
MCH: 24.1 pg — ABNORMAL LOW (ref 26.0–34.0)
MCHC: 31.8 g/dL (ref 30.0–36.0)
MCV: 75.9 fL — ABNORMAL LOW (ref 80.0–100.0)
Platelets: 294 10*3/uL (ref 150–400)
RBC: 4.1 MIL/uL (ref 3.87–5.11)
RDW: 16.2 % — ABNORMAL HIGH (ref 11.5–15.5)
WBC: 2.1 10*3/uL — ABNORMAL LOW (ref 4.0–10.5)
nRBC: 0 % (ref 0.0–0.2)

## 2022-06-30 LAB — BASIC METABOLIC PANEL
Anion gap: 4 — ABNORMAL LOW (ref 5–15)
BUN: 6 mg/dL (ref 6–20)
CO2: 23 mmol/L (ref 22–32)
Calcium: 7.7 mg/dL — ABNORMAL LOW (ref 8.9–10.3)
Chloride: 110 mmol/L (ref 98–111)
Creatinine, Ser: 0.72 mg/dL (ref 0.44–1.00)
GFR, Estimated: >60 mL/min (ref >60)
Glucose, Bld: 129 mg/dL — ABNORMAL HIGH (ref 70–99)
Potassium: 3.4 mmol/L — ABNORMAL LOW (ref 3.5–5.1)
Sodium: 137 mmol/L (ref 135–145)

## 2022-06-30 LAB — HEMOGLOBIN AND HEMATOCRIT, BLOOD
HCT: 32 % — ABNORMAL LOW (ref 36.0–46.0)
Hemoglobin: 10 g/dL — ABNORMAL LOW (ref 12.0–15.0)

## 2022-06-30 LAB — SURGICAL PATHOLOGY

## 2022-06-30 MED ORDER — ONDANSETRON HCL 4 MG PO TABS
4.0000 mg | ORAL_TABLET | Freq: Three times a day (TID) | ORAL | 11 refills | Status: DC
  Filled 2022-06-30 – 2023-05-18 (×2): qty 90, 30d supply, fill #0
  Filled 2023-06-28 – 2023-06-29 (×2): qty 90, 30d supply, fill #1

## 2022-06-30 MED ORDER — HYDROCHLOROTHIAZIDE 25 MG PO TABS: 25.0000 mg | ORAL_TABLET | Freq: Every day | ORAL | 0 refills | Status: DC

## 2022-06-30 MED ORDER — PANTOPRAZOLE SODIUM 40 MG PO TBEC
40.0000 mg | DELAYED_RELEASE_TABLET | Freq: Every day | ORAL | 3 refills | Status: DC
  Filled 2022-06-30: qty 30, 30d supply, fill #0

## 2022-06-30 MED ORDER — POLYETHYLENE GLYCOL 3350 17 G PO PACK
17.0000 g | PACK | Freq: Two times a day (BID) | ORAL | 3 refills | Status: DC
  Filled 2022-06-30: qty 60, 30d supply, fill #0

## 2022-06-30 MED ORDER — BISACODYL 10 MG RE SUPP: 10.0000 mg | Freq: Once | RECTAL | Status: DC

## 2022-06-30 MED ORDER — PROCHLORPERAZINE MALEATE 10 MG PO TABS
10.0000 mg | ORAL_TABLET | Freq: Four times a day (QID) | ORAL | 0 refills | Status: DC | PRN
  Filled 2022-06-30 – 2022-07-16 (×2): qty 30, 8d supply, fill #0

## 2022-06-30 MED ORDER — SENNA 8.6 MG PO TABS
2.0000 | ORAL_TABLET | Freq: Every day | ORAL | 3 refills | Status: DC
  Filled 2022-06-30 – 2023-05-18 (×2): qty 60, 30d supply, fill #0
  Filled 2023-06-28: qty 60, 30d supply, fill #1

## 2022-06-30 MED ORDER — POLYETHYLENE GLYCOL 3350 17 G PO PACK: 17.0000 g | PACK | Freq: Two times a day (BID) | ORAL | Status: DC

## 2022-06-30 NOTE — Plan of Care (Signed)

## 2022-06-30 NOTE — Progress Notes (Addendum)
Patient ID: Chelsea Hansen, female   DOB: 03/24/1971, 52 y.o.   MRN: [id number]    Progress Note   Subjective   Day # 2  CC; hematemesis, recent recurrent nausea and vomiting  Head CT negative EGD yesterday-mild Schatzki's ring at GE junction, 2 cm hiatal hernia normal-appearing esophagus, colitis congestion and erythema of the proximal gastric body and fundus, biopsies done-no high-grade source for bleeding found  Hemoglobin 9.2 last p.m., repeat pending  Patient says she feels better and was able to tolerate full liquid diet without difficulty no nausea or vomiting currently and no further hematemesis.  She has not had a bowel movement in over a week Hoping  to be discharged today   Objective   Vital signs in last 24 hours: Temp:  [97.9 F (36.6 C)-99.9 F (37.7 C)] 98.6 F (37 C) (01/10 0225) Pulse Rate:  [64-111] 65 (01/10 0225) Resp:  [14-20] 19 (01/10 0225) BP: (110-143)/(66-83) 143/83 (01/10 0225) SpO2:  [96 %-100 %] 100 % (01/10 0225) Last BM Date : 06/23/22 General:    AA female in NAD Heart:  Regular rate and rhythm; no murmurs Lungs: Respirations even and unlabored, lungs CTA bilaterally Abdomen:  Soft, nontender and nondistended. Normal bowel sounds. Extremities:  Without edema. Neurologic:  Alert and oriented,  grossly normal neurologically. Psych:  Cooperative. Normal mood and affect.  Intake/Output from previous day: 01/09 0701 - 01/10 0700 In: 2377.2 [P.O.:120; I.V.:2257.2] Out: -  Intake/Output this shift: No intake/output data recorded.  Lab Results: Recent Labs    06/28/22 2010 06/29/22 0206 06/29/22 0749  WBC 3.8*  --   --   HGB 10.8* 9.8* 9.2*  HCT 34.8* 31.4* 30.1*  PLT 333  --   --    BMET Recent Labs    06/28/22 2010 06/29/22 0533  NA 134* 136  K 3.7 3.4*  CL 103 108  CO2 23 22  GLUCOSE 90 90  BUN 9 8  CREATININE 0.83 0.70  CALCIUM 8.0* 7.3*   LFT Recent Labs    06/28/22 2010  PROT 8.1  ALBUMIN 4.4  AST 17  ALT 17   ALKPHOS 102  BILITOT 0.5   PT/INR Recent Labs    06/28/22 2010  LABPROT 13.9  INR 1.1    Studies/Results: CT HEAD W & WO CONTRAST (5MM)  Result Date: 06/29/2022 CLINICAL DATA:  Breast cancer rule out metastatic disease. Intractable nausea and vomiting EXAM: CT HEAD WITHOUT AND WITH CONTRAST TECHNIQUE: Contiguous axial images were obtained from the base of the skull through the vertex without and with intravenous contrast. RADIATION DOSE REDUCTION: This exam was performed according to the departmental dose-optimization program which includes automated exposure control, adjustment of the mA and/or kV according to patient size and/or use of iterative reconstruction technique. CONTRAST:  65m OMNIPAQUE IOHEXOL 300 MG/ML  SOLN COMPARISON:  CT head 02/02/2021 FINDINGS: Brain: Ventricle size normal. No acute infarct, hemorrhage, mass. Hypodensity right frontal parietal lobe on axial images consistent with slightly prominent sulcus. No edema or mass-effect. Vascular: Negative for hyperdense vessel. Normal vascular enhancement Skull: No skeletal lesion identified. Sinuses/Orbits: Mild mucosal edema paranasal sinuses. Negative orbit Other: None IMPRESSION: Negative CT head with contrast. Electronically Signed   By: CFranchot GalloM.D.   On: 06/29/2022 17:52   CT Angio Chest PE W and/or Wo Contrast  Result Date: 06/28/2022 CLINICAL DATA:  Pulmonary embolism (PE) suspected, high prob; Pancreatitis, acute, severe. Breast cancer, hematemesis EXAM: CT ANGIOGRAPHY CHEST CT ABDOMEN AND PELVIS WITH CONTRAST  TECHNIQUE: Multidetector CT imaging of the chest was performed using the standard protocol during bolus administration of intravenous contrast. Multiplanar CT image reconstructions and MIPs were obtained to evaluate the vascular anatomy. Multidetector CT imaging of the abdomen and pelvis was performed using the standard protocol during bolus administration of intravenous contrast. RADIATION DOSE REDUCTION: This  exam was performed according to the departmental dose-optimization program which includes automated exposure control, adjustment of the mA and/or kV according to patient size and/or use of iterative reconstruction technique. CONTRAST:  140m OMNIPAQUE IOHEXOL 350 MG/ML SOLN COMPARISON:  CT chest 05/11/2022, PET CT 06/02/2022 FINDINGS: CTA CHEST FINDINGS Cardiovascular: Adequate opacification of the pulmonary arterial tree. No intraluminal filling defect identified to suggest acute pulmonary embolism. Central pulmonary arteries are mildly enlarged suggesting changes of pulmonary arterial hypertension. No significant coronary artery calcification. Cardiac size is at the upper limits of normal. No pericardial effusion. No significant atherosclerotic calcification within the thoracic aorta. No aortic aneurysm. Mediastinum/Nodes: Visualized thyroid is unremarkable. No pathologic adenopathy within the thorax. The esophagus is unremarkable. Small hiatal hernia. Lungs/Pleura: Mild subpleural reticulation and nodularity within the anterior right upper lobe and right middle lobe may represent post radiation changes. The lungs are otherwise clear. No confluent pulmonary infiltrate. No pneumothorax or pleural effusion. Central airways are widely patent. Musculoskeletal: Surgical clip surround asymmetric soft tissue within the right breast, incompletely visualized on this examination. Numerous sclerotic foci are seen throughout the thoracic spine and sternum in keeping with changes of osteoblastic bone metastases. These appears stable since prior examination of 05/11/2022. No associated pathologic fracture. Review of the MIP images confirms the above findings. CT ABDOMEN and PELVIS FINDINGS Hepatobiliary: No focal liver abnormality is seen. No gallstones, gallbladder wall thickening, or biliary dilatation. Pancreas: Unremarkable Spleen: Unremarkable Adrenals/Urinary Tract: Adrenal glands are unremarkable. Kidneys are normal,  without renal calculi, focal lesion, or hydronephrosis. Bladder is unremarkable. Stomach/Bowel: Moderate to severe pancolonic diverticulosis, most severe within the cecum and ascending colon. The stomach, small bowel, and large bowel are otherwise unremarkable. No evidence of obstruction or focal inflammation. The appendix is normal. No free intraperitoneal gas or fluid. Vascular/Lymphatic: No significant vascular findings are present. No enlarged abdominal or pelvic lymph nodes. Reproductive: Status post hysterectomy. No adnexal masses. Other: No abdominal wall hernia or abnormality. No abdominopelvic ascites. Musculoskeletal: Mixed lytic and sclerotic metastases are seen throughout the visualized axial skeleton with stable pathologic compression fracture of L3 without associated retropulsion or listhesis. No acute bone abnormality. Review of the MIP images confirms the above findings. IMPRESSION: 1. No pulmonary embolism. No acute intrathoracic pathology identified. 2. No acute intra-abdominal pathology identified. No definite radiographic explanation for the patient's reported symptoms. 3. Moderate to severe pancolonic diverticulosis without superimposed acute inflammatory change. 4. Diffuse osseous metastatic disease with stable pathologic compression fracture of L3. Electronically Signed   By: AFidela SalisburyM.D.   On: 06/28/2022 21:45   CT ABDOMEN PELVIS W CONTRAST  Result Date: 06/28/2022 CLINICAL DATA:  Pulmonary embolism (PE) suspected, high prob; Pancreatitis, acute, severe. Breast cancer, hematemesis EXAM: CT ANGIOGRAPHY CHEST CT ABDOMEN AND PELVIS WITH CONTRAST TECHNIQUE: Multidetector CT imaging of the chest was performed using the standard protocol during bolus administration of intravenous contrast. Multiplanar CT image reconstructions and MIPs were obtained to evaluate the vascular anatomy. Multidetector CT imaging of the abdomen and pelvis was performed using the standard protocol during bolus  administration of intravenous contrast. RADIATION DOSE REDUCTION: This exam was performed according to the departmental dose-optimization program which includes automated  exposure control, adjustment of the mA and/or kV according to patient size and/or use of iterative reconstruction technique. CONTRAST:  132m OMNIPAQUE IOHEXOL 350 MG/ML SOLN COMPARISON:  CT chest 05/11/2022, PET CT 06/02/2022 FINDINGS: CTA CHEST FINDINGS Cardiovascular: Adequate opacification of the pulmonary arterial tree. No intraluminal filling defect identified to suggest acute pulmonary embolism. Central pulmonary arteries are mildly enlarged suggesting changes of pulmonary arterial hypertension. No significant coronary artery calcification. Cardiac size is at the upper limits of normal. No pericardial effusion. No significant atherosclerotic calcification within the thoracic aorta. No aortic aneurysm. Mediastinum/Nodes: Visualized thyroid is unremarkable. No pathologic adenopathy within the thorax. The esophagus is unremarkable. Small hiatal hernia. Lungs/Pleura: Mild subpleural reticulation and nodularity within the anterior right upper lobe and right middle lobe may represent post radiation changes. The lungs are otherwise clear. No confluent pulmonary infiltrate. No pneumothorax or pleural effusion. Central airways are widely patent. Musculoskeletal: Surgical clip surround asymmetric soft tissue within the right breast, incompletely visualized on this examination. Numerous sclerotic foci are seen throughout the thoracic spine and sternum in keeping with changes of osteoblastic bone metastases. These appears stable since prior examination of 05/11/2022. No associated pathologic fracture. Review of the MIP images confirms the above findings. CT ABDOMEN and PELVIS FINDINGS Hepatobiliary: No focal liver abnormality is seen. No gallstones, gallbladder wall thickening, or biliary dilatation. Pancreas: Unremarkable Spleen: Unremarkable  Adrenals/Urinary Tract: Adrenal glands are unremarkable. Kidneys are normal, without renal calculi, focal lesion, or hydronephrosis. Bladder is unremarkable. Stomach/Bowel: Moderate to severe pancolonic diverticulosis, most severe within the cecum and ascending colon. The stomach, small bowel, and large bowel are otherwise unremarkable. No evidence of obstruction or focal inflammation. The appendix is normal. No free intraperitoneal gas or fluid. Vascular/Lymphatic: No significant vascular findings are present. No enlarged abdominal or pelvic lymph nodes. Reproductive: Status post hysterectomy. No adnexal masses. Other: No abdominal wall hernia or abnormality. No abdominopelvic ascites. Musculoskeletal: Mixed lytic and sclerotic metastases are seen throughout the visualized axial skeleton with stable pathologic compression fracture of L3 without associated retropulsion or listhesis. No acute bone abnormality. Review of the MIP images confirms the above findings. IMPRESSION: 1. No pulmonary embolism. No acute intrathoracic pathology identified. 2. No acute intra-abdominal pathology identified. No definite radiographic explanation for the patient's reported symptoms. 3. Moderate to severe pancolonic diverticulosis without superimposed acute inflammatory change. 4. Diffuse osseous metastatic disease with stable pathologic compression fracture of L3. Electronically Signed   By: AFidela SalisburyM.D.   On: 06/28/2022 21:45       Assessment / Plan:    [age]year old African-American female admitted with persistent nausea and vomiting, followed by hematemesis in setting of metastatic breast cancer to the bone.  EGD yesterday without any significant findings.  She has some erythema of the stomach-biopsies pending  Suspect hematemesis was secondary to gastropathy from forceful vomiting  Current nausea and vomiting over the past few weeks very likely secondary to her chemotherapy regimen, she had the Verzenio dose  decreased but continued to have symptoms. Will need to discuss changing her regimen with oncology  CT  head negative, no evidence of metastatic disease  Nausea/vomiting well-controlled on low-dose IV Reglan and around-the-clock Zofran.  # 2 constipation-chronic with recent worsening  Plan; advance to regular diet Continue Protonix 40 mg p.o. once daily She may do better at home taking Zofran on a scheduled basis 4 mg every 8 hours.  Please be sure she has prescription for Zofran on discharge.  She is stable from GI perspective for  discharge, doing follow-up hemoglobin  Patient says MiraLAX generally does not work.  She may benefit from doing a MiraLAX purge at home with 6-8 doses of MiraLAX taken 15 to 20 minutes apart. Then try MiraLAX 17 g in 8 ounces of water twice daily, and Senokot as 2 at bedtime Dulcolax suppository as needed          Principal Problem:   Hematemesis Active Problems:   Malignant neoplasm of upper-outer quadrant of right breast in female, estrogen receptor positive ([place])   Metastasis to bone ([facility])   Nausea and vomiting in adult     LOS: 1 day   Amy Esterwood PA-C 06/30/2022, 11:01 AM  I have taken an interval history and thoroughly reviewed the chart. I agree with the Advanced Practitioner's note, impression and recommendations, and have recorded additional findings, impressions and recommendations below. This patient was discharged before I could see her.  My additional thoughts are as follows:  Clinically stable for discharge from GI perspective.  She will need regular dosing of antiemetics, ondansetron and/or metoclopramide, to control her chemotherapy related nausea and vomiting.  The ondansetron is probably contributing constipation, so I would favor the short-term use of metoclopramide.  Outpatient attention to her recent constipation, follow-up care to be coordinated with Dr. Fuller Plan at our office.   Nelida Meuse  III Office:[phone]

## 2022-06-30 NOTE — Progress Notes (Signed)
Pt IV removed and patient discharged in stable condition. Patient preferred to walk to exit instead of wheelchair. Staff assisted patient down patients transportation.

## 2022-06-30 NOTE — Discharge Summary (Signed)
Physician Discharge Summary   Patient: Chelsea Hansen MRN: [id number] DOB: 05-27-1971  Admit date:     06/28/2022  Discharge date: 06/30/22  Discharge Physician: Estill Cotta, MD    PCP: [name], NP   Recommendations at discharge:   Per GI recommendations, Protonix 40 mg p.o. daily, Zofran on a scheduled basis 4 mg every 8 hours MiraLAX 17 g p.o. twice daily Senokot 2 tabs at bedtime  Discharge Diagnoses:    Hematemesis likely due to gastropathy from forceful vomiting Chronic constipation with recent worsening   Malignant neoplasm of upper-outer quadrant of right breast in female, estrogen receptor positive ([place])   Metastasis to bone (HCC)   Nausea and vomiting in adult   Hospital Course: 52-year-old female with history significant for breast cancer with mets to lung/mediastinum, abdomen, bone currently on chemo and radiation presenting with 2 weeks of intractable nausea/vomiting.  Patient reported has been having daily intractable nausea and vomiting for the past 2 weeks with noted small amount of bleeding in vomitus.  Patient had a large onset of hematemesis and had a syncope event afterwards prior to coming to the ED. Hemoglobin 10.8 on admission  Assessment and Plan:  Hematemesis -Likely due to gastropathy from forceful vomiting -GI was consulted, underwent EGD which showed mild Schatzki ring, congested and erythematous mucosa in the wall of the gastric body.  Several biopsies obtained.  Possibility of forceful vomiting/Mallory-Weiss tear -Recommended Protonix -CT head negative for any mets or intracranial cause of intractable nausea vomiting.   Intractable nausea vomiting -Currently improving, to commended scheduled Zofran 4 mg every 8 hours, bowel regimen with MiraLAX and Senokot  Malignant right breast CA, ER positive, metastatic with diffuse bone mets -Outpatient follow-up with Dr. Lindi Adie. -Chemo was held while inpatient   Patient cleared to be discharged  home by GI     Pain control - [place] was reviewed. and patient was instructed, not to drive, operate heavy machinery, perform activities at heights, swimming or participation in water activities or provide baby-sitting services while on Pain, Sleep and Anxiety Medications; until their outpatient Physician has advised to do so again. Also recommended to not to take more than prescribed Pain, Sleep and Anxiety Medications.  Consultants: Gastroenterology Procedures performed: Endoscopy Disposition: Home Diet recommendation: Soft diet  DISCHARGE MEDICATION: Allergies as of 06/30/2022   No Known Allergies      Medication List     STOP taking these medications    promethazine 25 MG suppository Commonly known as: PHENERGAN       TAKE these medications    amLODipine 10 MG tablet Commonly known as: NORVASC Take 1 tablet  by mouth daily.   anastrozole 1 MG tablet Commonly known as: ARIMIDEX Take 1 tablet (1 mg total) by mouth daily.   calcium-vitamin D 500-5 MG-MCG tablet Commonly known as: OSCAL WITH D Take 1 tablet by mouth daily with supper.   escitalopram 20 MG tablet Commonly known as: LEXAPRO Take 1 tablet (20 mg total) by mouth daily.   hydrochlorothiazide 25 MG tablet Commonly known as: HYDRODIURIL Take 1 tablet (25 mg total) by mouth daily. HOLD until follow up with your primary doctor What changed: additional instructions   montelukast 10 MG tablet Commonly known as: SINGULAIR Take 1 tablet (10 mg total) by mouth at bedtime.   ondansetron 4 MG tablet Commonly known as: Zofran Take 1 tablet (4 mg total) by mouth every 8 (eight) hours.   oxyCODONE-acetaminophen 7.5-325 MG  tablet Commonly known as: Percocet Take 1 - 2 tablets by mouth every 6 (six) hours as needed for severe pain.   pantoprazole 40 MG tablet Commonly known as: Protonix Take 1 tablet (40 mg total) by mouth daily.   polyethylene glycol  17 g packet Commonly known as: MIRALAX / GLYCOLAX Take 17 g by mouth 2 (two) times daily.   prochlorperazine 10 MG tablet Commonly known as: COMPAZINE Take 1 tablet (10 mg total) by mouth every 6 (six) hours as needed for nausea or vomiting.   propranolol 10 MG tablet Commonly known as: INDERAL Take 1 tablet (10 mg total) by mouth 2 (two) times daily.   senna 8.6 MG Tabs tablet Commonly known as: SENOKOT Take 2 tablets (17.2 mg total) by mouth at bedtime.   Ventolin HFA 108 (90 Base) MCG/ACT inhaler Generic drug: albuterol Inhale 2 puffs by mouth into the lungs every 6 hours as needed for wheezing or shortness of breath.   Verzenio 50 MG tablet Generic drug: abemaciclib Take 1 tablet (50 mg total) by mouth 2 (two) times daily.   Xtampza ER 9 MG C12a Generic drug: oxyCODONE ER Take 1 capsule by mouth 2 times daily.        Follow-up Information     [name], NP. Schedule an appointment as soon as possible for a visit in 2 week(s).   Specialty: Internal Medicine Why: for hospital follow-up Contact information: [address] [place] [address] [phone]         Doran Stabler, MD. Schedule an appointment as soon as possible for a visit in 4 week(s).   Specialty: Gastroenterology Why: for hospital follow-up Contact information: [address] [address] [phone]                Discharge Exam: Danley Danker Weights   06/28/22 1852  Weight: 98.9 kg   S: No acute complaints, wants to go home.  Husband at the bedside.   BP 113/69 (BP Location: Right Arm)   Pulse 68   Temp 98.7 F (37.1 C) (Oral)   Resp 20   Ht '5\' 11"'$  (1.803 m)   Wt 98.9 kg   SpO2 94%   BMI 30.40 kg/m    Physical Exam General: Alert and oriented x 3, NAD Cardiovascular: S1 S2 clear, RRR.  Respiratory: CTAB, no wheezing, rales or rhonchi Gastrointestinal: Soft, nontender, nondistended, NBS Ext: no pedal edema bilaterally Neuro: no new  deficits Psych: Normal affect and demeanor, alert and oriented x3    Condition at discharge: fair  The results of significant diagnostics from this hospitalization (including imaging, microbiology, ancillary and laboratory) are listed below for reference.   Imaging Studies: CT HEAD W & WO CONTRAST (5MM)  Result Date: 06/29/2022 CLINICAL DATA:  Breast cancer rule out metastatic disease. Intractable nausea and vomiting EXAM: CT HEAD WITHOUT AND WITH CONTRAST TECHNIQUE: Contiguous axial images were obtained from the base of the skull through the vertex without and with intravenous contrast. RADIATION DOSE REDUCTION: This exam was performed according to the departmental dose-optimization program which includes automated exposure control, adjustment of the mA and/or kV according to patient size and/or use of iterative reconstruction technique. CONTRAST:  28m OMNIPAQUE IOHEXOL 300 MG/ML  SOLN COMPARISON:  CT head 02/02/2021 FINDINGS: Brain: Ventricle size normal. No acute infarct, hemorrhage, mass. Hypodensity right frontal parietal lobe on axial images consistent with slightly prominent sulcus. No edema or mass-effect. Vascular: Negative for hyperdense vessel. Normal vascular enhancement  Skull: No skeletal lesion identified. Sinuses/Orbits: Mild mucosal edema paranasal sinuses. Negative orbit Other: None IMPRESSION: Negative CT head with contrast. Electronically Signed   By: Franchot Gallo M.D.   On: 06/29/2022 17:52   CT Angio Chest PE W and/or Wo Contrast  Result Date: 06/28/2022 CLINICAL DATA:  Pulmonary embolism (PE) suspected, high prob; Pancreatitis, acute, severe. Breast cancer, hematemesis EXAM: CT ANGIOGRAPHY CHEST CT ABDOMEN AND PELVIS WITH CONTRAST TECHNIQUE: Multidetector CT imaging of the chest was performed using the standard protocol during bolus administration of intravenous contrast. Multiplanar CT image reconstructions and MIPs were obtained to evaluate the vascular anatomy. Multidetector  CT imaging of the abdomen and pelvis was performed using the standard protocol during bolus administration of intravenous contrast. RADIATION DOSE REDUCTION: This exam was performed according to the departmental dose-optimization program which includes automated exposure control, adjustment of the mA and/or kV according to patient size and/or use of iterative reconstruction technique. CONTRAST:  166m OMNIPAQUE IOHEXOL 350 MG/ML SOLN COMPARISON:  CT chest 05/11/2022, PET CT 06/02/2022 FINDINGS: CTA CHEST FINDINGS Cardiovascular: Adequate opacification of the pulmonary arterial tree. No intraluminal filling defect identified to suggest acute pulmonary embolism. Central pulmonary arteries are mildly enlarged suggesting changes of pulmonary arterial hypertension. No significant coronary artery calcification. Cardiac size is at the upper limits of normal. No pericardial effusion. No significant atherosclerotic calcification within the thoracic aorta. No aortic aneurysm. Mediastinum/Nodes: Visualized thyroid is unremarkable. No pathologic adenopathy within the thorax. The esophagus is unremarkable. Small hiatal hernia. Lungs/Pleura: Mild subpleural reticulation and nodularity within the anterior right upper lobe and right middle lobe may represent post radiation changes. The lungs are otherwise clear. No confluent pulmonary infiltrate. No pneumothorax or pleural effusion. Central airways are widely patent. Musculoskeletal: Surgical clip surround asymmetric soft tissue within the right breast, incompletely visualized on this examination. Numerous sclerotic foci are seen throughout the thoracic spine and sternum in keeping with changes of osteoblastic bone metastases. These appears stable since prior examination of 05/11/2022. No associated pathologic fracture. Review of the MIP images confirms the above findings. CT ABDOMEN and PELVIS FINDINGS Hepatobiliary: No focal liver abnormality is seen. No gallstones, gallbladder  wall thickening, or biliary dilatation. Pancreas: Unremarkable Spleen: Unremarkable Adrenals/Urinary Tract: Adrenal glands are unremarkable. Kidneys are normal, without renal calculi, focal lesion, or hydronephrosis. Bladder is unremarkable. Stomach/Bowel: Moderate to severe pancolonic diverticulosis, most severe within the cecum and ascending colon. The stomach, small bowel, and large bowel are otherwise unremarkable. No evidence of obstruction or focal inflammation. The appendix is normal. No free intraperitoneal gas or fluid. Vascular/Lymphatic: No significant vascular findings are present. No enlarged abdominal or pelvic lymph nodes. Reproductive: Status post hysterectomy. No adnexal masses. Other: No abdominal wall hernia or abnormality. No abdominopelvic ascites. Musculoskeletal: Mixed lytic and sclerotic metastases are seen throughout the visualized axial skeleton with stable pathologic compression fracture of L3 without associated retropulsion or listhesis. No acute bone abnormality. Review of the MIP images confirms the above findings. IMPRESSION: 1. No pulmonary embolism. No acute intrathoracic pathology identified. 2. No acute intra-abdominal pathology identified. No definite radiographic explanation for the patient's reported symptoms. 3. Moderate to severe pancolonic diverticulosis without superimposed acute inflammatory change. 4. Diffuse osseous metastatic disease with stable pathologic compression fracture of L3. Electronically Signed   By: AFidela SalisburyM.D.   On: 06/28/2022 21:45   CT ABDOMEN PELVIS W CONTRAST  Result Date: 06/28/2022 CLINICAL DATA:  Pulmonary embolism (PE) suspected, high prob; Pancreatitis, acute, severe. Breast cancer, hematemesis EXAM: CT ANGIOGRAPHY CHEST CT  ABDOMEN AND PELVIS WITH CONTRAST TECHNIQUE: Multidetector CT imaging of the chest was performed using the standard protocol during bolus administration of intravenous contrast. Multiplanar CT image reconstructions and  MIPs were obtained to evaluate the vascular anatomy. Multidetector CT imaging of the abdomen and pelvis was performed using the standard protocol during bolus administration of intravenous contrast. RADIATION DOSE REDUCTION: This exam was performed according to the departmental dose-optimization program which includes automated exposure control, adjustment of the mA and/or kV according to patient size and/or use of iterative reconstruction technique. CONTRAST:  186m OMNIPAQUE IOHEXOL 350 MG/ML SOLN COMPARISON:  CT chest 05/11/2022, PET CT 06/02/2022 FINDINGS: CTA CHEST FINDINGS Cardiovascular: Adequate opacification of the pulmonary arterial tree. No intraluminal filling defect identified to suggest acute pulmonary embolism. Central pulmonary arteries are mildly enlarged suggesting changes of pulmonary arterial hypertension. No significant coronary artery calcification. Cardiac size is at the upper limits of normal. No pericardial effusion. No significant atherosclerotic calcification within the thoracic aorta. No aortic aneurysm. Mediastinum/Nodes: Visualized thyroid is unremarkable. No pathologic adenopathy within the thorax. The esophagus is unremarkable. Small hiatal hernia. Lungs/Pleura: Mild subpleural reticulation and nodularity within the anterior right upper lobe and right middle lobe may represent post radiation changes. The lungs are otherwise clear. No confluent pulmonary infiltrate. No pneumothorax or pleural effusion. Central airways are widely patent. Musculoskeletal: Surgical clip surround asymmetric soft tissue within the right breast, incompletely visualized on this examination. Numerous sclerotic foci are seen throughout the thoracic spine and sternum in keeping with changes of osteoblastic bone metastases. These appears stable since prior examination of 05/11/2022. No associated pathologic fracture. Review of the MIP images confirms the above findings. CT ABDOMEN and PELVIS FINDINGS  Hepatobiliary: No focal liver abnormality is seen. No gallstones, gallbladder wall thickening, or biliary dilatation. Pancreas: Unremarkable Spleen: Unremarkable Adrenals/Urinary Tract: Adrenal glands are unremarkable. Kidneys are normal, without renal calculi, focal lesion, or hydronephrosis. Bladder is unremarkable. Stomach/Bowel: Moderate to severe pancolonic diverticulosis, most severe within the cecum and ascending colon. The stomach, small bowel, and large bowel are otherwise unremarkable. No evidence of obstruction or focal inflammation. The appendix is normal. No free intraperitoneal gas or fluid. Vascular/Lymphatic: No significant vascular findings are present. No enlarged abdominal or pelvic lymph nodes. Reproductive: Status post hysterectomy. No adnexal masses. Other: No abdominal wall hernia or abnormality. No abdominopelvic ascites. Musculoskeletal: Mixed lytic and sclerotic metastases are seen throughout the visualized axial skeleton with stable pathologic compression fracture of L3 without associated retropulsion or listhesis. No acute bone abnormality. Review of the MIP images confirms the above findings. IMPRESSION: 1. No pulmonary embolism. No acute intrathoracic pathology identified. 2. No acute intra-abdominal pathology identified. No definite radiographic explanation for the patient's reported symptoms. 3. Moderate to severe pancolonic diverticulosis without superimposed acute inflammatory change. 4. Diffuse osseous metastatic disease with stable pathologic compression fracture of L3. Electronically Signed   By: AFidela SalisburyM.D.   On: 06/28/2022 21:45   MR Lumbar Spine W Wo Contrast  Result Date: 06/07/2022 CLINICAL DATA:  52year old female with breast cancer. Skeletal metastatic disease, recently negative on PET-CT EXAM: MRI LUMBAR SPINE WITHOUT AND WITH CONTRAST TECHNIQUE: Multiplanar and multiecho pulse sequences of the lumbar spine were obtained without and with intravenous  contrast. CONTRAST:  183mGADAVIST GADOBUTROL 1 MMOL/ML IV SOLN COMPARISON:  PET-CT 06/02/2022. FINDINGS: Segmentation: Normal with hypoplastic ribs at T12 on the comparison. Alignment: Mild straightening of lumbar lordosis. Minor levoconvex scoliosis. No significant spondylolisthesis. Vertebrae: Pathologic L3 vertebral body compression fracture with mild comminution, superior  and inferior endplate deformities. Patchy enhancement there following contrast, but little to no associated marrow edema on STIR imaging (series 6, image 8), and not FDG avid 4 days ago. Scattered additional sclerotic appearing spinal metastases with decreased T1 and T2 signal, most pronounced in the S1 body. No significant marrow edema or enhancement. No epidural or extraosseous tumor extension identified. Conus medullaris and cauda equina: Conus extends to the T12-L1 level. No lower spinal cord or conus signal abnormality. No abnormal intradural enhancement. Normal cauda equina nerve roots. No dural thickening. Paraspinal and other soft tissues: Stable to the recent PET-CT Disc levels: Capacious spinal canal. Lumbar disc degeneration primarily L2-L3 and L3-L4 with disc bulging. No spinal stenosis. Mild to moderate lateral recess and/or foraminal stenosis at the right L2, right L3, bilateral L3 nerve levels. IMPRESSION: 1. Skeletal metastatic disease which is likely treated/quiescent - including an L3 pathologic vertebral body fracture with mild comminution and enhancement, but little if any marrow edema and did NOT appear hypermetabolic on recent PET-CT. 2. No lumbar dural or intrathecal metastases. And capacious spinal canal with no spinal stenosis despite some degeneration. Electronically Signed   By: Genevie Ann M.D.   On: 06/07/2022 08:35   NM PET Image Initial (PI) Skull Base To Thigh  Result Date: 06/02/2022 CLINICAL DATA:  Subsequent treatment strategy for breast cancer. EXAM: NUCLEAR MEDICINE PET SKULL BASE TO THIGH TECHNIQUE: 11.89  mCi F-18 FDG was injected intravenously. Full-ring PET imaging was performed from the skull base to thigh after the radiotracer. CT data was obtained and used for attenuation correction and anatomic localization. Fasting blood glucose: 108 mg/dl COMPARISON:  Multiple priors including most recent CT chest May 11, 2022 FINDINGS: Mediastinal blood pool activity: SUV max 1.1 Liver activity: SUV max NA NECK: No hypermetabolic cervical adenopathy. Incidental CT findings: None. CHEST: No hypermetabolic thoracic adenopathy. No hypermetabolic pulmonary nodules or masses. Postsurgical change of prior right breast lumpectomy with a non hypermetabolic 2.2 x 1.5 cm soft tissue density in the surgical bed with overlying skin thickening on image 105/2, compatible with postsurgical change. Postsurgical change in the left breast with non hypermetabolic band of soft tissue density measuring 2.6 x 0.9 cm on image 99/2 compatible with postsurgical change. Incidental CT findings: Aortic atherosclerosis. Mild cardiac enlargement. Small hiatal hernia. ABDOMEN/PELVIS: No abnormal hypermetabolic activity within the liver, pancreas, adrenal glands, or spleen. No hypermetabolic lymph nodes in the abdomen or pelvis. Incidental CT findings: Colonic diverticulosis without findings of acute diverticulitis. SKELETON: No abnormal FDG avidity in the multifocal sclerotic and mixed lytic and sclerotic lesions involving the spine, ribs sacrum pelvic bones and bilateral proximal femurs. Incidental CT findings: None. IMPRESSION: 1. Postsurgical change of bilateral breast lumpectomy without evidence of hypermetabolic local recurrence. 2. No abnormal FDG avidity associated with the multifocal sclerotic and mixed lytic/sclerotic osseous lesions throughout the axial and proximal appendicular skeleton. 3. No evidence of hypermetabolic soft tissue metastatic disease. 4. Colonic diverticulosis without findings of acute diverticulitis. 5.  Aortic  Atherosclerosis (ICD10-I70.0). Electronically Signed   By: Dahlia Bailiff M.D.   On: 06/02/2022 10:46    Microbiology: Results for orders placed or performed during the hospital encounter of 05/28/20  Resp Panel by RT-PCR (Flu A&B, Covid) Nasopharyngeal Swab     Status: None   Collection Time: 05/28/20  4:30 PM   Specimen: Nasopharyngeal Swab; Nasopharyngeal(NP) swabs in vial transport medium  Result Value Ref Range Status   SARS Coronavirus 2 by RT PCR NEGATIVE NEGATIVE Final    Comment: (  NOTE) SARS-CoV-2 target nucleic acids are NOT DETECTED.  The SARS-CoV-2 RNA is generally detectable in upper respiratory specimens during the acute phase of infection. The lowest concentration of SARS-CoV-2 viral copies this assay can detect is 138 copies/mL. A negative result does not preclude SARS-Cov-2 infection and should not be used as the sole basis for treatment or other patient management decisions. A negative result may occur with  improper specimen collection/handling, submission of specimen other than nasopharyngeal swab, presence of viral mutation(s) within the areas targeted by this assay, and inadequate number of viral copies(<138 copies/mL). A negative result must be combined with clinical observations, patient history, and epidemiological information. The expected result is Negative.  Fact Sheet for Patients:  EntrepreneurPulse.com.au  Fact Sheet for Healthcare Providers:  IncredibleEmployment.be  This test is no t yet approved or cleared by the [place] FDA and  has been authorized for detection and/or diagnosis of SARS-CoV-2 by FDA under an Emergency Use Authorization (EUA). This EUA will remain  in effect (meaning this test can be used) for the duration of the COVID-19 declaration under Section 564(b)(1) of the Act, 21 U.S.C.section 360bbb-3(b)(1), unless the authorization is terminated  or revoked sooner.       Influenza A by PCR  NEGATIVE NEGATIVE Final   Influenza B by PCR NEGATIVE NEGATIVE Final    Comment: (NOTE) The Xpert Xpress SARS-CoV-2/FLU/RSV plus assay is intended as an aid in the diagnosis of influenza from Nasopharyngeal swab specimens and should not be used as a sole basis for treatment. Nasal washings and aspirates are unacceptable for Xpert Xpress SARS-CoV-2/FLU/RSV testing.  Fact Sheet for Patients: EntrepreneurPulse.com.au  Fact Sheet for Healthcare Providers: IncredibleEmployment.be  This test is not yet approved or cleared by the [place] FDA and has been authorized for detection and/or diagnosis of SARS-CoV-2 by FDA under an Emergency Use Authorization (EUA). This EUA will remain in effect (meaning this test can be used) for the duration of the COVID-19 declaration under Section 564(b)(1) of the Act, 21 U.S.C. section 360bbb-3(b)(1), unless the authorization is terminated or revoked.  Performed at [facility], [place] [address] [place], [place] [address]     Labs: CBC: Recent Labs  Lab 06/24/22 [phone] 06/28/22 2010 06/29/22 0206 06/29/22 0749 06/30/22 1149 06/30/22 1150  WBC 3.3* 3.8*  --   --  2.1*  --   NEUTROABS 1.6* 2.8  --   --   --   --   HGB 10.7* 10.8* 9.8* 9.2* 9.9* 10.0*  HCT 33.1* 34.8* 31.4* 30.1* 31.1* 32.0*  MCV 74.7* 77.5*  --   --  75.9*  --   PLT 293 333  --   --  294  --    Basic Metabolic Panel: Recent Labs  Lab 06/24/22 0832 06/28/22 2010 06/29/22 0533 06/29/22 0749 06/30/22 1149  NA 139 134* 136  --  137  K 3.6 3.7 3.4*  --  3.4*  CL 104 103 108  --  110  CO2 '29 23 22  '$ --  23  GLUCOSE 99 90 90  --  129*  BUN '12 9 8  '$ --  6  CREATININE 1.01* 0.83 0.70  --  0.72  CALCIUM 8.4* 8.0* 7.3*  --  7.7*  MG  --   --   --  2.1  --    Liver Function Tests: Recent Labs  Lab 06/24/22 0832 06/28/22 2010  AST 14* 17  ALT 12 17  ALKPHOS 110 102  BILITOT 0.4 0.5  PROT 6.7 8.1  ALBUMIN 4.0 4.4    CBG: No results for input(s): "GLUCAP" in the last 168 hours.  Discharge time spent: greater than 30 minutes.  Signed: Estill Cotta, MD [facility] 06/30/2022

## 2022-06-30 NOTE — Progress Notes (Signed)
  Transition of Care [facility]) Screening Note   Patient Details  Name: Chelsea Hansen Date of Birth: 1971-05-16   Transition of Care [facility]) CM/SW Contact:    Dessa Phi, RN Phone Number: 06/30/2022, 12:31 PM    Transition of Care Department [facility]) has reviewed patient and no TOC needs have been identified at this time. We will continue to monitor patient advancement through interdisciplinary progression rounds. If new patient transition needs arise, please place a TOC consult.

## 2022-07-01 ENCOUNTER — Other Ambulatory Visit: Payer: Self-pay

## 2022-07-01 ENCOUNTER — Other Ambulatory Visit (HOSPITAL_COMMUNITY): Payer: Self-pay

## 2022-07-01 ENCOUNTER — Ambulatory Visit
Admission: RE | Admit: 2022-07-01 | Discharge: 2022-07-01 | Disposition: A | Discharge status: Home / Self Care | Payer: Medicaid Other | Source: Ambulatory Visit | Attending: Radiation Oncology | Admitting: Radiation Oncology

## 2022-07-01 ENCOUNTER — Encounter: Payer: Self-pay | Admitting: Gastroenterology

## 2022-07-01 ENCOUNTER — Telehealth: Payer: Self-pay

## 2022-07-01 ENCOUNTER — Encounter (HOSPITAL_COMMUNITY): Payer: Self-pay | Admitting: Gastroenterology

## 2022-07-01 DIAGNOSIS — C7951 Secondary malignant neoplasm of bone: Secondary | ICD-10-CM | POA: Diagnosis present

## 2022-07-01 LAB — RAD ONC ARIA SESSION SUMMARY
Course Elapsed Days: 8
Plan Fractions Treated to Date: 6
Plan Prescribed Dose Per Fraction: 3 Gy
Plan Total Fractions Prescribed: 10
Plan Total Prescribed Dose: 30 Gy
Reference Point Dosage Given to Date: 18 Gy
Reference Point Session Dosage Given: 3 Gy
Session Number: 6

## 2022-07-01 NOTE — Telephone Encounter (Signed)
Transition Care Management Follow-up Telephone Call Date of discharge and from where: 06/30/2022, [facility] How have you been since you were released from the hospital? She stated she is "here." Any questions or concerns? Yes- she said that the providers in the hospital want PCP to take her off of her BP medications.  The patient then said that she has already stopped taking amlodipine and propanolol.   Items Reviewed: Did the pt receive and understand the discharge instructions provided? Yes  Medications obtained and verified? Yes - she said she has all of her medications.  Other? No  Any new allergies since your discharge? No  Dietary orders reviewed? Yes her appetite has been poor due to vomiting. Since she has been home she said she has been able to tolerate full liquids as well as mashed potatoes and sausage.  Do you have support at home? Yes   Home Care and Equipment/Supplies: Were home health services ordered? no If so, what is the name of the agency? N/a  Has the agency set up a time to come to the patient's home? not applicable Were any new equipment or medical supplies ordered?  No What is the name of the medical supply agency? N/a Were you able to get the supplies/equipment? not applicable Do you have any questions related to the use of the equipment or supplies? No  Functional Questionnaire: (I = Independent and D = Dependent) ADLs: ambulates with cane. Independent with personal care but she said she could use some help.  I explained that PCP can assess for PCS at her appointment.   Follow up appointments reviewed:  PCP Hospital f/u appt confirmed? Yes  Scheduled to see Juluis Mire, NP - 07/13/2022.  [facility] f/u appt confirmed? Yes  -She has multiple appointments scheduled with oncology for treatments Are transportation arrangements needed? No  If their condition worsens, is the pt aware to call PCP or go to the Emergency Dept.? Yes Was the patient  provided with contact information for the PCP's office or ED? Yes Was to pt encouraged to call back with questions or concerns? Yes

## 2022-07-02 ENCOUNTER — Ambulatory Visit
Admission: RE | Admit: 2022-07-02 | Discharge: 2022-07-02 | Disposition: A | Discharge status: Home / Self Care | Payer: Medicaid Other | Source: Ambulatory Visit | Attending: Radiation Oncology | Admitting: Radiation Oncology

## 2022-07-02 ENCOUNTER — Other Ambulatory Visit: Payer: Self-pay

## 2022-07-02 DIAGNOSIS — C7951 Secondary malignant neoplasm of bone: Secondary | ICD-10-CM | POA: Diagnosis not present

## 2022-07-02 LAB — RAD ONC ARIA SESSION SUMMARY
Course Elapsed Days: 9
Plan Fractions Treated to Date: 7
Plan Prescribed Dose Per Fraction: 3 Gy
Plan Total Fractions Prescribed: 10
Plan Total Prescribed Dose: 30 Gy
Reference Point Dosage Given to Date: 21 Gy
Reference Point Session Dosage Given: 3 Gy
Session Number: 7

## 2022-07-05 ENCOUNTER — Other Ambulatory Visit: Payer: Self-pay

## 2022-07-05 ENCOUNTER — Ambulatory Visit
Admission: RE | Admit: 2022-07-05 | Discharge: 2022-07-05 | Disposition: A | Discharge status: Home / Self Care | Payer: Medicaid Other | Source: Ambulatory Visit | Attending: Radiation Oncology | Admitting: Radiation Oncology

## 2022-07-05 DIAGNOSIS — C7951 Secondary malignant neoplasm of bone: Secondary | ICD-10-CM | POA: Diagnosis not present

## 2022-07-05 LAB — RAD ONC ARIA SESSION SUMMARY
Course Elapsed Days: 12
Plan Fractions Treated to Date: 8
Plan Prescribed Dose Per Fraction: 3 Gy
Plan Total Fractions Prescribed: 10
Plan Total Prescribed Dose: 30 Gy
Reference Point Dosage Given to Date: 24 Gy
Reference Point Session Dosage Given: 3 Gy
Session Number: 8

## 2022-07-06 ENCOUNTER — Ambulatory Visit: Payer: Medicaid Other

## 2022-07-06 ENCOUNTER — Other Ambulatory Visit: Payer: Self-pay

## 2022-07-06 ENCOUNTER — Ambulatory Visit
Admission: RE | Admit: 2022-07-06 | Discharge: 2022-07-06 | Disposition: A | Discharge status: Home / Self Care | Payer: Medicaid Other | Source: Ambulatory Visit | Attending: Radiation Oncology | Admitting: Radiation Oncology

## 2022-07-06 DIAGNOSIS — C7951 Secondary malignant neoplasm of bone: Secondary | ICD-10-CM

## 2022-07-06 DIAGNOSIS — Z170 Estrogen receptor positive status [ER+]: Secondary | ICD-10-CM | POA: Diagnosis present

## 2022-07-06 DIAGNOSIS — R293 Abnormal posture: Secondary | ICD-10-CM

## 2022-07-06 DIAGNOSIS — C50411 Malignant neoplasm of upper-outer quadrant of right female breast: Secondary | ICD-10-CM | POA: Diagnosis present

## 2022-07-06 DIAGNOSIS — C50412 Malignant neoplasm of upper-outer quadrant of left female breast: Secondary | ICD-10-CM | POA: Diagnosis present

## 2022-07-06 DIAGNOSIS — R209 Unspecified disturbances of skin sensation: Secondary | ICD-10-CM

## 2022-07-06 DIAGNOSIS — I890 Lymphedema, not elsewhere classified: Secondary | ICD-10-CM | POA: Diagnosis present

## 2022-07-06 DIAGNOSIS — G893 Neoplasm related pain (acute) (chronic): Secondary | ICD-10-CM | POA: Diagnosis present

## 2022-07-06 LAB — RAD ONC ARIA SESSION SUMMARY
Course Elapsed Days: 13
Plan Fractions Treated to Date: 9
Plan Prescribed Dose Per Fraction: 3 Gy
Plan Total Fractions Prescribed: 10
Plan Total Prescribed Dose: 30 Gy
Reference Point Dosage Given to Date: 27 Gy
Reference Point Session Dosage Given: 3 Gy
Session Number: 9

## 2022-07-06 NOTE — Therapy (Signed)
OUTPATIENT PHYSICAL THERAPY ONCOLOGY EVALUATION  Patient Name: Chelsea Hansen MRN: [id number] DOB:1970/12/26, 52 y.o., female Today's Date: 07/06/2022  END OF SESSION:  PT End of Session - 07/06/22 1006     Visit Number 2    Number of Visits 16    Date for PT Re-Evaluation 08/20/22    Authorization Type Medicaid    Authorization Time Period 4 weeks    Authorization - Visit Number 1    Authorization - Number of Visits 3    Progress Note Due on Visit 4    PT Start Time 1007    PT Stop Time 1053    PT Time Calculation (min) 46 min    Activity Tolerance Patient tolerated treatment well    Behavior During Therapy WFL for tasks assessed/performed             Past Medical History:  Diagnosis Date   Allergy    seasonal allergies   Anxiety    on meds   Asthma    uses inhaler   Breast cancer ([place]) 2012   RIGHT lumpectomy   Cancer ([place]) 2022   RIGHT breast lump-dx 2022   Depression    on meds   DVT (deep venous thrombosis) ([place]) 2010   after hysterectomy   Family history of uterine cancer    GERD (gastroesophageal reflux disease)    with certain foods/OTC PRN meds   Headache(784.0)    History of radiation therapy    Bilateral breast- 09/03/21-11/02/21- Dr. [name]   Hyperlipidemia    on meds   Hypertension    on meds   SVT (supraventricular tachycardia)    Past Surgical History:  Procedure Laterality Date   ABDOMINAL HYSTERECTOMY     AXILLARY SENTINEL NODE BIOPSY Left 07/09/2021   Procedure: LEFT AXILLARY SENTINEL NODE BIOPSY;  Surgeon: [name], MD;  Location: [facility];  Service: General;  Laterality: Left;   BIOPSY  06/29/2022   Procedure: BIOPSY;  Surgeon: Doran Stabler, MD;  Location: [facility];  Service: Gastroenterology;;   BREAST EXCISIONAL BIOPSY Right 09/16/2009   BREAST LUMPECTOMY WITH RADIOACTIVE SEED LOCALIZATION Bilateral 07/09/2021   Procedure: BILATERAL BREAST LUMPECTOMY WITH RADIOACTIVE SEED LOCALIZATION;   Surgeon: [name], MD;  Location: [facility];  Service: General;  Laterality: Bilateral;   BREAST SURGERY     lumpectomy   ESOPHAGOGASTRODUODENOSCOPY (EGD) WITH PROPOFOL N/A 06/29/2022   Procedure: ESOPHAGOGASTRODUODENOSCOPY (EGD) WITH PROPOFOL;  Surgeon: Doran Stabler, MD;  Location: [facility];  Service: Gastroenterology;  Laterality: N/A;   PORT-A-CATH REMOVAL Left 07/09/2021   Procedure: REMOVAL PORT-A-CATH;  Surgeon: [name], MD;  Location: [facility];  Service: General;  Laterality: Left;   PORTACATH PLACEMENT Left 01/26/2021   Procedure: INSERTION PORT-A-CATH;  Surgeon: [name], MD;  Location: [facility];  Service: General;  Laterality: Left;   RADIOACTIVE SEED GUIDED AXILLARY SENTINEL LYMPH NODE Right 07/09/2021   Procedure: RADIOACTIVE SEED GUIDED RIGHT AXILLARY SENTINEL LYMPH NODE DISSECTION;  Surgeon: [name], MD;  Location: [facility];  Service: General;  Laterality: Right;   TUBAL LIGATION     Patient Active Problem List   Diagnosis Date Noted   Chronic constipation 06/30/2022   Nausea and vomiting in adult 06/29/2022   Hematemesis 06/28/2022   Metastasis to bone ([place]) 05/20/2022   PSVT (paroxysmal supraventricular tachycardia) 07/08/2021   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   GERD (gastroesophageal reflux disease) 05/27/2021   Hyperlipidemia 05/27/2021  Hypertension 05/27/2021   Pain management contract signed 05/04/2021   Chronic pain syndrome (breast cancer) 05/04/2021   Palpitations 03/25/2021   Sinus bradycardia 03/25/2021   Daytime somnolence 03/25/2021   Snoring 03/25/2021   Obesity (BMI 30-39.9) 03/25/2021   Port-A-Cath in place 01/27/2021   Genetic testing 12/24/2020   Family history of uterine cancer 12/17/2020   Malignant neoplasm of upper-outer quadrant of right breast in female, estrogen receptor positive ([place]) 12/12/2020   Malignant neoplasm of upper-outer  quadrant of left breast in female, estrogen receptor positive ([place]) 12/12/2020   Hypokalemia 01/02/2012   Asthma 01/01/2012   Bradycardia 01/01/2012   Syncope 12/31/2011   Mediastinal adenopathy 12/31/2011   Anemia 12/31/2011   Chest pain 12/31/2011    PCP: Juluis Mire  REFERRING PROVIDER: [name]  REFERRING DIAG: Malignant neoplasm of upper-outer quadrant of right breast in female, estrogen receptor positive ([place])  Malignant neoplasm of upper-outer quadrant of left breast in female, estrogen receptor positive ([place])  Lymphedema, not elsewhere classified  Metastasis to bone ([place])  Neoplasm related pain (acute) (chronic)  THERAPY DIAG:  Malignant neoplasm of upper-outer quadrant of right breast in female, estrogen receptor positive ([place])  Malignant neoplasm of upper-outer quadrant of left breast in female, estrogen receptor positive ([place])  Lymphedema, not elsewhere classified  Metastasis to bone ([place])  Neoplasm related pain (acute) (chronic)  Abnormal posture  Disturbance of skin sensation  ONSET DATE: 01/2021  Rationale for Evaluation and Treatment: Rehabilitation  SUBJECTIVE:                                                                                                                                                                                           SUBJECTIVE STATEMENT: I was in the hospital for 3 days last week. She had all kinds of tests that were negative.  I get sick after every radiation. My disability has not come through. I have to move out of my apt by Feb 1.  Its raining outside and I am in Pain. My last day of radiation to my back is tomorrow.  PERTINENT HISTORY:  Neoadjuvant chemotherapy with Doxorubicin and Cyclophosphamide given x 4 beginning 01/27/2021 and completing on 03/10/2021 followed by weekly paclitaxel x 12 beginning 05/2021 07/09/2021: Right lumpectomy: Grade 2 invasive lobular cancer, 2.5 cm, LCIS, margins negative, LCIS focally  at anterior margin, 1/1 lymph node positive, ER 95%, PR 100%, HER2 negative, Ki-67 40% Left lumpectomy: High-grade DCIS: 0.7 cm, margins negative, 0/1 lymph node negative ER 95%, PR 100%. Pt now  STAGE IV Breast Cancer with bone mets throughout thoracic and lumbar regions and is pending radiation to her lumbar  spine next week.    PAIN:  Are you having pain? Yes NPRS scale: 9/10 Pain location: LB/hips and left ribs, legs, hips Pain orientation: Bilateral  PAIN TYPE: aching, sharp, throbbing, and tight Pain description: constant  Aggravating factors: standing, sleeping, walking, stairs, housework Relieving factors: Pain meds,changing positions,  PRECAUTIONS: BONE METS currently having palliative radiation to spine, bilateral breast lymphedema, bilateral UE lymphedema risk  WEIGHT BEARING RESTRICTIONS: No  FALLS:  Has patient fallen in last 6 months? No  LIVING ENVIRONMENT: Lives with: lives with their family and lives with an adult companion Lives in: House/apartment Stairs: Yes; Internal: 1 flight steps; on left going up Has following equipment at home: Single point cane, Walker - 4 wheeled, shower chair, and bed side commode  OCCUPATION: mgr at [employer]  LEISURE: shop, dominoes  HAND DOMINANCE: right   PRIOR LEVEL OF FUNCTION: Independent  PATIENT GOALS:   Get back to life, keep breast swelling down, learn exercises to strengthen my back/core,keep shoulder ROM on the right track   OBJECTIVE:  COGNITION: Overall cognitive status: Within functional limits for tasks assessed   PALPATION:   OBSERVATIONS / OTHER ASSESSMENTS: bilateral breasts with enlarged pores but improved since prior treatment session  SENSATION: Light touch: Appears intact   POSTURE: forward head, rounded shoulders  UPPER EXTREMITY AROM/PROM:  A/PROM RIGHT   eval   Shoulder extension 55  Shoulder flexion 157  Shoulder abduction 174  Shoulder internal rotation 65  Shoulder external rotation  98    (Blank rows = not tested)  A/PROM LEFT   eval  Shoulder extension 56  Shoulder flexion 156  Shoulder abduction 172  Shoulder internal rotation 65  Shoulder external rotation 95    (Blank rows = not tested)  CERVICAL AROM: All within functional limits:    LUMBAR ROM: Lumbar ROM without increasing pain; Bilateral SB decreased 25%, FB decreased 90%, BB decreased 90% all with pain  ANKLE DF : right -12,  LEFT  -8  HS Length: 30 deg Bilaterally  UPPER EXTREMITY STRENGTH: WFL  LOWER EXTREMITY MMT: WFL     LYMPHEDEMA ASSESSMENTS:   SURGERY TYPE/DATE: 07/09/2021 BILATERAL BREAST LUMPECTOMY WITH RADIOACTIVE SEED LOCALIZATION RADIOACTIVE SEED GUIDED RIGHT AXILLARY TARGETED LYMPH NODE DISSECTION LEFT AXILLARY SENTINEL NODE BIOPSY LEFT AXILLARY LYMPH NODE MAPPING WITH SENTIMAG    NUMBER OF LYMPH NODES REMOVED: Right 1/1, Left 0/1   CHEMOTHERAPY:  neoadjuvant    RADIATION:yes bilateral breast, presently having palliative radiation for bone METS to spine    HORMONE TREATMENT: YES  INFECTIONS: NO  LYMPHEDEMA ASSESSMENTS:     LANDMARK RIGHT  03/04/2022 Right   10 cm proximal to olecranon process 30.5   Olecranon process 27.1   10 cm proximal to ulnar styloid process 21.2   Just proximal to ulnar styloid process 16.6   Across hand at thumb web space 21.4   At base of 2nd digit 6.8   (Blank rows = not tested)   LANDMARK LEFT  03/04/2022 LEFT  10 cm proximal to olecranon process 30.7   Olecranon process 26.7   10 cm proximal to ulnar styloid process 20.5   Just proximal to ulnar styloid process 16.3   Across hand at thumb web space 21.1   At base of 2nd digit 6.7   (Blank rows = not tested)      GAIT: Distance walked: 20 feet Assistive device utilized: None Level of assistance: Complete Independence Comments: decreased heel strike, toe off, sideways translation with some LOB  QUICK  DASH SURVEY: not completed   TODAY'S TREATMENT:                                                                                                                                          DATE: 07/06/2022  Instructed in sit to and from supine and practiced x 3 and hip hinge to protect back with activities at home. Initiated Meeks decompression exercises x 5 ea with 2-3 second hold and updated HEP Sitting heel and toe raises x 10 ea Sitting gastroc stretch x 3 with 20 sec hold Standing scapular retraction with yellow band x 10 Added all above to HEP. Advised pt she should never have pain with exercises and to back off if she does,   06/25/2022  Evaluation only  PATIENT EDUCATION:  Education details: discussed POC/activities that will be performed Person educated: Patient Education method: Explanation Education comprehension: verbalized understanding  HOME EXERCISE PROGRAM: None given  ASSESSMENT:  CLINICAL IMPRESSION:  Pt has her last radiation tomorrow for METS to the lumbar spine.  She was instructed in proper body mechanics for getting in and out of bed and did very well. She was also instructed how to use a hip hinge for home activities as opposed to bending her back. She had some difficulty with hip hinge and will need further review. She was surprised how tight her calves felt with stretching. Hep was updated and she felt like she had a good workout.  OBJECTIVE IMPAIRMENTS: Abnormal gait, decreased activity tolerance, decreased balance, decreased ROM, decreased strength, increased edema, impaired sensation, improper body mechanics, and postural dysfunction.   ACTIVITY LIMITATIONS: lifting, bending, standing, sleeping, stairs, bed mobility, and locomotion level  PARTICIPATION LIMITATIONS: cleaning, laundry, shopping, and occupation  PERSONAL FACTORS: 3+ comorbidities: bilateral Breast Cancer s/p Radiation with new bone Metastasis and palliative radiation for pain  are also affecting patient's functional outcome.   REHAB POTENTIAL: Good  CLINICAL  DECISION MAKING: Evolving/moderate complexity  EVALUATION COMPLEXITY: Low  GOALS: Goals reviewed with patient? Yes  SHORT TERM GOALS: Target date: 07/22/2022  Pt will be independent in a HEP for gentle lumbar/core neutral spine activities and LE flexibility Baseline: Goal status: INITIAL  2.  Pt will be able to get in and out of bed and brush teeth with good body mechanics Baseline: no knowledge Goal status: INITIAL  LONG TERM GOALS: Target date: 2/29/2023  Pt will be approved for Flexi touch Sequential compression pump for management of bilateral breast edema Baseline:  Goal status: INITIAL  2.  Pt will have AROM for bilateral DF improved to atleast 5 degrees for improved gait pattern Baseline:  Goal status: INITIAL  3.  Pt will be educated in proper body mechanics for home activities to prevent injury Baseline: no knowledge Goal status: INITIAL  4.  Pt will ambulate with improved heal strike /toe off and decreased sideways translation for improved energy conservation Baseline:  Goal status: INITIAL    PLAN:  PT FREQUENCY: 1-2x/week  PT DURATION: 8 weeks  PLANNED INTERVENTIONS: Therapeutic exercises, Therapeutic activity, Neuromuscular re-education, Balance training, Patient/Family education, Self Care, Orthotic/Fit training, Aquatic Therapy, Manual lymph drainage, Manual therapy, and Re-evaluation  PLAN FOR NEXT SESSION: body mechanics for in/out of bed, hip hinge, gastroc and HS stretches, Meeks decompression exercises, gentle core strength, STM prn for chest/muscular tightness, shoulder ROM prn,    Claris Pong, PT 07/06/2022, 10:55 AM

## 2022-07-06 NOTE — Patient Instructions (Signed)
1. Decompression Exercise     [facility] [phone]    Lie on back on firm surface, knees bent, feet flat, arms turned up, out to sides, backs of hands down. Time _5-15__ minutes. Surface: floor   2. Shoulder Press    Start in Decompression Exercise position. Press shoulders downward towards supporting surface. Hold __2-3__ seconds while counting out loud. Repeat _3-5___ times. Do _1-2___ times per day.   3. Head Press    Bring cervical spine (neck) into neutral position (by either tucking the chin towards the chest or tilting the chin upward). Feel weight on back of head. Press head downward into supporting surface.    Hold _2-3__ seconds. Repeat _3-5__ times. Do _1-2__ times per day.   4. Leg Lengthener    Straighten one leg. Pull toes AND forefoot toward knee, extend heel. Lengthen leg by pulling pelvis away from ribs. Hold _2-3__ seconds. Relax. Repeat __4-6__ times. Do other leg.  Surface: floor   5. Leg Press    Straighten one leg down to floor keeping leg aligned with hip. Pull toes AND forefoot toward knee; extend heel.  Press entire leg downward (as if pressing leg into sandy beach). DO NOT BEND KNEE. Hold _2-3__ seconds. Do __4-6__ times. Repeat with other leg.

## 2022-07-07 ENCOUNTER — Other Ambulatory Visit: Payer: Self-pay

## 2022-07-07 ENCOUNTER — Ambulatory Visit
Admission: RE | Admit: 2022-07-07 | Discharge: 2022-07-07 | Disposition: A | Discharge status: Home / Self Care | Payer: Medicaid Other | Source: Ambulatory Visit | Attending: Radiation Oncology | Admitting: Radiation Oncology

## 2022-07-07 ENCOUNTER — Ambulatory Visit: Payer: Medicaid Other

## 2022-07-07 DIAGNOSIS — C7951 Secondary malignant neoplasm of bone: Secondary | ICD-10-CM | POA: Diagnosis not present

## 2022-07-07 LAB — RAD ONC ARIA SESSION SUMMARY
Course Elapsed Days: 14
Plan Fractions Treated to Date: 10
Plan Prescribed Dose Per Fraction: 3 Gy
Plan Total Fractions Prescribed: 10
Plan Total Prescribed Dose: 30 Gy
Reference Point Dosage Given to Date: 30 Gy
Reference Point Session Dosage Given: 3 Gy
Session Number: 10

## 2022-07-08 ENCOUNTER — Telehealth: Payer: Self-pay | Admitting: Pharmacist

## 2022-07-08 ENCOUNTER — Inpatient Hospital Stay: Payer: Medicaid Other

## 2022-07-08 ENCOUNTER — Other Ambulatory Visit (HOSPITAL_COMMUNITY): Payer: Self-pay

## 2022-07-08 ENCOUNTER — Inpatient Hospital Stay: Payer: Medicaid Other | Admitting: Pharmacist

## 2022-07-08 ENCOUNTER — Other Ambulatory Visit: Payer: Self-pay

## 2022-07-08 ENCOUNTER — Ambulatory Visit: Payer: Medicaid Other

## 2022-07-08 NOTE — Telephone Encounter (Signed)
Called patient to r/s missed appointments. Left voicemail for patient to call us back.

## 2022-07-08 NOTE — Telephone Encounter (Signed)
Called patient to r/s missed 1/18 appointments. Left voicemail for patient to call us back

## 2022-07-09 ENCOUNTER — Ambulatory Visit: Payer: Medicaid Other

## 2022-07-12 ENCOUNTER — Encounter: Payer: Self-pay | Admitting: Nurse Practitioner

## 2022-07-12 ENCOUNTER — Inpatient Hospital Stay (HOSPITAL_BASED_OUTPATIENT_CLINIC_OR_DEPARTMENT_OTHER): Payer: Medicaid Other | Admitting: Nurse Practitioner

## 2022-07-12 ENCOUNTER — Ambulatory Visit: Payer: Medicaid Other

## 2022-07-12 ENCOUNTER — Other Ambulatory Visit (HOSPITAL_COMMUNITY): Payer: Self-pay

## 2022-07-12 ENCOUNTER — Other Ambulatory Visit: Payer: Self-pay

## 2022-07-12 VITALS — BP 124/82 | HR 66 | Temp 98.7°F | Resp 18 | Wt 206.5 lb

## 2022-07-12 DIAGNOSIS — G893 Neoplasm related pain (acute) (chronic): Secondary | ICD-10-CM | POA: Diagnosis not present

## 2022-07-12 DIAGNOSIS — C50411 Malignant neoplasm of upper-outer quadrant of right female breast: Secondary | ICD-10-CM

## 2022-07-12 DIAGNOSIS — R110 Nausea: Secondary | ICD-10-CM

## 2022-07-12 DIAGNOSIS — C7951 Secondary malignant neoplasm of bone: Secondary | ICD-10-CM

## 2022-07-12 DIAGNOSIS — Z170 Estrogen receptor positive status [ER+]: Secondary | ICD-10-CM

## 2022-07-12 DIAGNOSIS — Z515 Encounter for palliative care: Secondary | ICD-10-CM

## 2022-07-12 DIAGNOSIS — K5900 Constipation, unspecified: Secondary | ICD-10-CM

## 2022-07-12 MED ORDER — OXYCODONE ER 9 MG PO C12A
9.0000 mg | EXTENDED_RELEASE_CAPSULE | Freq: Two times a day (BID) | ORAL | 0 refills | Status: DC
  Filled 2022-08-10: qty 30, 15d supply, fill #0

## 2022-07-12 MED ORDER — OXYCODONE-ACETAMINOPHEN 7.5-325 MG PO TABS
1.0000 | ORAL_TABLET | Freq: Four times a day (QID) | ORAL | 0 refills | Status: DC | PRN
  Filled 2022-07-17: qty 102, 13d supply, fill #0

## 2022-07-12 NOTE — Progress Notes (Signed)
[facility]  [phone]) [phone] [phone]   Name: Chelsea Hansen Date: 07/12/2022 MRN: [id number]  DOB: [birth year]  Patient Care Team: Chelsea Perna, NP as PCP - General (Internal Medicine) Chelsea Salines, DO as PCP - Cardiology (Cardiology) Chelsea Pray, MD as Consulting Physician (Radiation Oncology) Chelsea Keens, MD as Consulting Physician (General Surgery) Chelsea Lose, MD as Consulting Physician (Hematology and Oncology) Chelsea Hansen, Chelsea Sax, NP as Nurse Practitioner (Nurse Practitioner)   INTERVAL HISTORY: Chelsea Hansen is a 52 y.o. 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  MRI of L-spine (12/23) showed L3 pathologic fracture and significant lesion along S1 with sclerotic changes.  Palliative ask to see for symptom management and goals of care.   SOCIAL HISTORY:     reports that she has never smoked. She has never used smokeless tobacco. She reports that she does not currently use alcohol after a past usage of about 7.0 standard drinks of alcohol per week. She reports that she does not use drugs.  ADVANCE DIRECTIVES:    CODE STATUS:   PAST MEDICAL HISTORY: Past Medical History:  Diagnosis Date   Allergy    seasonal allergies   Anxiety    on meds   Asthma    uses inhaler   Breast cancer ([place]) 2012   RIGHT lumpectomy   Cancer ([place]) 2022   RIGHT breast lump-dx 2022   Depression    on meds   DVT (deep venous thrombosis) ([place]) 2010   after hysterectomy   Family history of uterine cancer    GERD (gastroesophageal reflux disease)    with certain foods/OTC PRN meds   Headache(784.0)     History of radiation therapy    Bilateral breast- 09/03/21-11/02/21- Dr. Gery Hansen   Hyperlipidemia    on meds   Hypertension    on meds   SVT (supraventricular tachycardia)     ALLERGIES:  has No Known Allergies.  MEDICATIONS:  Current Outpatient Medications  Medication Sig Dispense Refill   abemaciclib (VERZENIO) 50 MG tablet Take 1 tablet (50 mg total) by mouth 2 (two) times daily. 60 tablet 3   albuterol (VENTOLIN HFA) 108 (90 Base) MCG/ACT inhaler Inhale 2 puffs by mouth into the lungs every 6 hours as needed for wheezing or shortness of breath. 18 g 2   amLODipine (NORVASC) 10 MG tablet Take 1 tablet  by mouth daily. 90 tablet 1   anastrozole (ARIMIDEX) 1 MG tablet Take 1 tablet (1 mg total) by mouth daily. 90 tablet 3   calcium-vitamin D (OSCAL WITH D) 500-5 MG-MCG tablet Take 1 tablet by mouth daily with supper. 90 tablet 2   escitalopram (LEXAPRO) 20 MG tablet Take 1 tablet (20 mg total) by mouth daily. 90 tablet 1   hydrochlorothiazide (HYDRODIURIL) 25 MG tablet Take 1 tablet (25 mg total) by mouth daily. HOLD until follow up with your primary doctor 30 tablet 0   montelukast (SINGULAIR) 10 MG tablet Take 1 tablet (10 mg total) by mouth at bedtime. 90 tablet 3   ondansetron (ZOFRAN) 4 MG tablet Take 1 tablet (4 mg total) by mouth every 8 (eight) hours. 90 tablet 11   oxyCODONE ER 9 MG C12A Take 1 capsule by mouth 2 times  daily. 30 capsule 0   oxyCODONE-acetaminophen (PERCOCET) 7.5-325 MG tablet Take 1 - 2 tablets by mouth every 6 (six) hours as needed for severe pain. 102 tablet 0   pantoprazole (PROTONIX) 40 MG tablet Take 1 tablet (40 mg total) by mouth daily. 30 tablet 3   polyethylene glycol (MIRALAX / GLYCOLAX) 17 g packet Take 17 g by mouth 2 (two) times daily. 60 each 3   prochlorperazine (COMPAZINE) 10 MG tablet Take 1 tablet (10 mg total) by mouth every 6 (six) hours as needed for nausea or vomiting. 30 tablet 0   propranolol (INDERAL) 10 MG tablet Take 1 tablet (10  mg total) by mouth 2 (two) times daily. 180 tablet 3   senna (SENOKOT) 8.6 MG TABS tablet Take 2 tablets (17.2 mg total) by mouth at bedtime. 60 tablet 3   No current facility-administered medications for this visit.   Facility-Administered Medications Ordered in Other Visits  Medication Dose Route Frequency Provider Last Rate Last Admin   0.9 %  sodium chloride infusion   Intravenous Continuous Causey, Charlestine Massed, NP   Stopped at 06/24/22 1309    VITAL SIGNS: There were no vitals taken for this visit. There were no vitals filed for this visit.   Estimated body mass index is 30.4 kg/m as calculated from the following:   Height as of 06/28/22: '5\' 11"'$  (1.803 m).   Weight as of 06/28/22: 218 lb (98.9 kg).   PERFORMANCE STATUS (ECOG) : 1 - Symptomatic but completely ambulatory  Assessment NAD RRR Normal breathing pattern  AAO x3  IMPRESSION: Chelsea Hansen presents to clinic today for symptom management follow-up. No acute distress noted. She was recently admitted for hematemesis and syncopal episode. At that time she was evaluated by GI with recommendations for daily Protonix and Zofran in the setting of gastritis. She is planning a trip to [place] in the next month which she is looking forward to. Is actively working.   Neoplasm related pain Chelsea Hansen reports her pain has been controlled on current regimen. She is taking medications as prescribed. Pain is in her back and shoulder area. Decreases with medications.    We discussed at length her current regimen.  She is taking Xtampza 9 mg every 12 hours and Percocet every 6 hours as needed for breakthrough pain. Chelsea Hansen does not have to take Percocet around the clock. She understands we will continue with close follow-up maintaining current regimen given pain being well-controlled.  She knows to contact our office sooner if needed.  No changes will be made today. Advised to take medication with food to see if this helps with her nausea.    Nausea/Vomiting  Chelsea Hansen reports nausea has improved since recent hospitalization. Some days are better than others. She has Zofran and Compazine available. Is planning to pick up prescriptions today. Denies nausea and vomiting today.   Constipation Controlled on current regimen.   PLAN: Continue Xtampza 9 mg every 12 hours. Take with food.  Continue Percocet every 6 hours as needed for breakthrough pain MiraLAX twice daily for bowel regimen Senna daily Protonix 40 mg daily  Patient reports pain is well-controlled. I will plan to see patient back in 2-3 weeks in collaboration with her other oncology appointments. Patient knows to contact our office sooner if needed.   Patient expressed understanding and was in agreement with this plan. She also understands that She can call the clinic at any time with any questions, concerns, or complaints.   Any controlled substances utilized  were prescribed in the context of palliative care. PDMP has been reviewed.   Time Total: 45 min   Visit consisted of counseling and education dealing with the complex and emotionally intense issues of symptom management and palliative care in the setting of serious and potentially life-threatening illness.Greater than 50%  of this time was spent counseling and coordinating care related to the above assessment and plan.  Chelsea Hansen, AGPCNP-BC  Palliative Medicine Team/[place]

## 2022-07-13 ENCOUNTER — Other Ambulatory Visit (HOSPITAL_COMMUNITY): Payer: Self-pay

## 2022-07-13 ENCOUNTER — Ambulatory Visit: Payer: Medicaid Other

## 2022-07-13 ENCOUNTER — Encounter (INDEPENDENT_AMBULATORY_CARE_PROVIDER_SITE_OTHER): Payer: Self-pay | Admitting: Primary Care

## 2022-07-13 ENCOUNTER — Ambulatory Visit (INDEPENDENT_AMBULATORY_CARE_PROVIDER_SITE_OTHER): Payer: Medicaid Other | Admitting: Primary Care

## 2022-07-13 VITALS — BP 119/83 | HR 63 | Resp 16 | Ht 71.0 in | Wt 208.2 lb

## 2022-07-13 DIAGNOSIS — I10 Essential (primary) hypertension: Secondary | ICD-10-CM | POA: Diagnosis not present

## 2022-07-13 DIAGNOSIS — C7951 Secondary malignant neoplasm of bone: Secondary | ICD-10-CM

## 2022-07-13 DIAGNOSIS — Z09 Encounter for follow-up examination after completed treatment for conditions other than malignant neoplasm: Secondary | ICD-10-CM

## 2022-07-13 DIAGNOSIS — R112 Nausea with vomiting, unspecified: Secondary | ICD-10-CM

## 2022-07-13 DIAGNOSIS — C50412 Malignant neoplasm of upper-outer quadrant of left female breast: Secondary | ICD-10-CM

## 2022-07-13 DIAGNOSIS — R197 Diarrhea, unspecified: Secondary | ICD-10-CM

## 2022-07-13 DIAGNOSIS — C50919 Malignant neoplasm of unspecified site of unspecified female breast: Secondary | ICD-10-CM

## 2022-07-13 DIAGNOSIS — C50411 Malignant neoplasm of upper-outer quadrant of right female breast: Secondary | ICD-10-CM

## 2022-07-13 DIAGNOSIS — I890 Lymphedema, not elsewhere classified: Secondary | ICD-10-CM

## 2022-07-13 DIAGNOSIS — G893 Neoplasm related pain (acute) (chronic): Secondary | ICD-10-CM

## 2022-07-13 NOTE — Progress Notes (Signed)
[facility]   Subjective:  [name] is a 52 y.o. female presents for hospital follow up . Admit date to the hospital was 06/28/22, patient was discharged from the hospital on 06/30/22, patient was admitted for: Malignant neoplasm of upper-outer quadrant of right breast in female, estrogen receptor positive ,Metastasis to boneHematemesis, Nausea and vomiting in adult and Chronic constipation. Patient has No headache, No chest pain, No abdominal pain - No Nausea, No new weakness tingling or numbness, No Cough - shortness of breath. She complained of back pain for 2 years. Sent to pain management and PT. A CT scan was done and she has Stage 4 cancer. Malignant neoplasm of upper-outer quadrant of right breast in female, estrogen receptor positive and Metastasis to bone.  Past Medical History:  Diagnosis Date   Allergy    seasonal allergies   Anxiety    on meds   Asthma    uses inhaler   Breast cancer ([place]) 2012   RIGHT lumpectomy   Cancer ([place]) 2022   RIGHT breast lump-dx 2022   Depression    on meds   DVT (deep venous thrombosis) ([place]) 2010   after hysterectomy   Family history of uterine cancer    GERD (gastroesophageal reflux disease)    with certain foods/OTC PRN meds   Headache(784.0)    History of radiation therapy    Bilateral breast- 09/03/21-11/02/21- Dr. [name]   Hyperlipidemia    on meds   Hypertension    on meds   SVT (supraventricular tachycardia)      No Known Allergies    Current Outpatient Medications on File Prior to Visit  Medication Sig Dispense Refill   abemaciclib (VERZENIO) 50 MG tablet Take 1 tablet (50 mg total) by mouth 2 (two) times daily. 60 tablet 3   albuterol (VENTOLIN HFA) 108 (90 Base) MCG/ACT inhaler Inhale 2 puffs by mouth into the lungs every 6 hours as needed for wheezing or shortness of breath. 18 g 2   amLODipine (NORVASC) 10 MG tablet Take 1 tablet  by mouth daily. 90 tablet 1   anastrozole (ARIMIDEX) 1 MG  tablet Take 1 tablet (1 mg total) by mouth daily. 90 tablet 3   calcium-vitamin D (OSCAL WITH D) 500-5 MG-MCG tablet Take 1 tablet by mouth daily with supper. 90 tablet 2   escitalopram (LEXAPRO) 20 MG tablet Take 1 tablet (20 mg total) by mouth daily. 90 tablet 1   hydrochlorothiazide (HYDRODIURIL) 25 MG tablet Take 1 tablet (25 mg total) by mouth daily. HOLD until follow up with your primary doctor 30 tablet 0   montelukast (SINGULAIR) 10 MG tablet Take 1 tablet (10 mg total) by mouth at bedtime. 90 tablet 3   ondansetron (ZOFRAN) 4 MG tablet Take 1 tablet (4 mg total) by mouth every 8 (eight) hours. 90 tablet 11   [START ON 07/24/2022] oxyCODONE ER 9 MG C12A Take 9 mg by mouth 2 (two) times daily. 30 capsule 0   [START ON 07/17/2022] oxyCODONE-acetaminophen (PERCOCET) 7.5-325 MG tablet Take 1 - 2 tablets by mouth every 6 (six) hours as needed for severe pain. 102 tablet 0   pantoprazole (PROTONIX) 40 MG tablet Take 1 tablet (40 mg total) by mouth daily. 30 tablet 3   polyethylene glycol (MIRALAX / GLYCOLAX) 17 g packet Take 17 g by mouth 2 (two) times daily. 60 each 3   prochlorperazine (COMPAZINE) 10 MG tablet Take 1 tablet (10 mg total) by mouth every 6 (six)  hours as needed for nausea or vomiting. 30 tablet 0   propranolol (INDERAL) 10 MG tablet Take 1 tablet (10 mg total) by mouth 2 (two) times daily. 180 tablet 3   senna (SENOKOT) 8.6 MG TABS tablet Take 2 tablets (17.2 mg total) by mouth at bedtime. 60 tablet 3   Current Facility-Administered Medications on File Prior to Visit  Medication Dose Route Frequency Provider Last Rate Last Admin   0.9 %  sodium chloride infusion   Intravenous Continuous Causey, Charlestine Massed, NP   Stopped at 06/24/22 1309     Review of System: Comprehensive ROS Pertinent positive and negative noted in HPI    Objective:  Blood Pressure 119/83   Pulse 63   Respiration 16   Height '5\' 11"'$  (1.803 m)   Weight 208 lb 3.2 oz (94.4 kg)   Oxygen Saturation 100%    Body Mass Index 29.04 kg/m   Filed Weights   07/13/22 1005  Weight: 208 lb 3.2 oz (94.4 kg)   Physical Exam: General Appearance: Well nourished, in no apparent distress. Eyes: PERRLA, EOMs, conjunctiva no swelling or erythema Sinuses: No Frontal/maxillary tenderness ENT/Mouth: Ext aud canals clear, TMs without erythema, bulging. No erythema, swelling, or exudate on post pharynx. Hearing normal.  Neck: Supple, thyroid normal.  Respiratory: Respiratory effort normal, BS equal bilaterally without rales, rhonchi, wheezing or stridor.  Cardio: RRR with no MRGs. Brisk peripheral pulses without edema.  Abdomen: Soft, + BS.   tender,  guarding, pain  Lymphatics: Non tender without lymphadenopathy.  Musculoskeletal: Full ROM, 5/5 strength, normal gait.  Skin: Warm, dry without rashes, lesions, ecchymosis.  Neuro: Cranial nerves intact. Normal muscle tone, no cerebellar symptoms. Sensation intact.  Psych: Awake and oriented X 3, normal affect, Insight and Judgment appropriate.    Assessment:  Pachia was seen today for hospitalization follow-up.  Diagnoses and all orders for this visit:  Hospital discharge follow-up Schedule an appointment with [name], NP (Internal Medicine) in 2 weeks (07/14/2022); for hospital follow-up Schedule an appointment with Doran Stabler, MD (Gastroenterology) in 4 weeks (07/28/2022); for hospital follow-up  Stage IV breast cancer in female [facility]) right breast neoplasm ER positive (11/2020), Left breast neoplasm ER positive (high-grade) s/p right lumpectomy, followed by oncology Patient states she has been c/o severe back pain and was sent to pain management and physical therapy .  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  MRI of L-spine (12/23) showed L3 pathologic fracture and significant lesion along S1 with sclerotic changes.   Essential hypertension,  benign No amlodipine  needed at this time Bp will be monitored   This note has been created with Surveyor, quantity. Any transcriptional errors are unintentional.   [name], NP 07/13/2022, 10:41 AM

## 2022-07-13 NOTE — Therapy (Signed)
OUTPATIENT PHYSICAL THERAPY ONCOLOGY EVALUATION  Patient Name: Chelsea Hansen MRN: [id number] DOB:09/08/1970, 52 y.o., female Today's Date: 07/13/2022  END OF SESSION:  PT End of Session - 07/13/22 1122     Visit Number 3    Number of Visits 16    Date for PT Re-Evaluation 08/20/22    Authorization Type Medicaid    Authorization Time Period 4 weeks    Authorization - Visit Number 2    Authorization - Number of Visits 3    Progress Note Due on Visit 4    PT Start Time 1122    PT Stop Time 7829    PT Time Calculation (min) 43 min    Activity Tolerance Patient tolerated treatment well    Behavior During Therapy WFL for tasks assessed/performed             Past Medical History:  Diagnosis Date   Allergy    seasonal allergies   Anxiety    on meds   Asthma    uses inhaler   Breast cancer ([place]) 2012   RIGHT lumpectomy   Cancer ([place]) 2022   RIGHT breast lump-dx 2022   Depression    on meds   DVT (deep venous thrombosis) ([place]) 2010   after hysterectomy   Family history of uterine cancer    GERD (gastroesophageal reflux disease)    with certain foods/OTC PRN meds   Headache(784.0)    History of radiation therapy    Bilateral breast- 09/03/21-11/02/21- Dr. [name]   Hyperlipidemia    on meds   Hypertension    on meds   SVT (supraventricular tachycardia)    Past Surgical History:  Procedure Laterality Date   ABDOMINAL HYSTERECTOMY     AXILLARY SENTINEL NODE BIOPSY Left 07/09/2021   Procedure: LEFT AXILLARY SENTINEL NODE BIOPSY;  Surgeon: [name], MD;  Location: [place];  Service: General;  Laterality: Left;   BIOPSY  06/29/2022   Procedure: BIOPSY;  Surgeon: Doran Stabler, MD;  Location: [facility];  Service: Gastroenterology;;   BREAST EXCISIONAL BIOPSY Right 09/16/2009   BREAST LUMPECTOMY WITH RADIOACTIVE SEED LOCALIZATION Bilateral 07/09/2021   Procedure: BILATERAL BREAST LUMPECTOMY WITH RADIOACTIVE SEED LOCALIZATION;   Surgeon: [name], MD;  Location: [place];  Service: General;  Laterality: Bilateral;   BREAST SURGERY     lumpectomy   ESOPHAGOGASTRODUODENOSCOPY (EGD) WITH PROPOFOL N/A 06/29/2022   Procedure: ESOPHAGOGASTRODUODENOSCOPY (EGD) WITH PROPOFOL;  Surgeon: Doran Stabler, MD;  Location: [facility];  Service: Gastroenterology;  Laterality: N/A;   PORT-A-CATH REMOVAL Left 07/09/2021   Procedure: REMOVAL PORT-A-CATH;  Surgeon: [name], MD;  Location: [place];  Service: General;  Laterality: Left;   PORTACATH PLACEMENT Left 01/26/2021   Procedure: INSERTION PORT-A-CATH;  Surgeon: [name], MD;  Location: [facility];  Service: General;  Laterality: Left;   RADIOACTIVE SEED GUIDED AXILLARY SENTINEL LYMPH NODE Right 07/09/2021   Procedure: RADIOACTIVE SEED GUIDED RIGHT AXILLARY SENTINEL LYMPH NODE DISSECTION;  Surgeon: [name], MD;  Location: [place];  Service: General;  Laterality: Right;   TUBAL LIGATION     Patient Active Problem List   Diagnosis Date Noted   Chronic constipation 06/30/2022   Nausea and vomiting in adult 06/29/2022   Hematemesis 06/28/2022   Metastasis to bone ([place]) 05/20/2022   PSVT (paroxysmal supraventricular tachycardia) 07/08/2021   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   GERD (gastroesophageal reflux disease) 05/27/2021   Hyperlipidemia 05/27/2021  Hypertension 05/27/2021   Pain management contract signed 05/04/2021   Chronic pain syndrome (breast cancer) 05/04/2021   Palpitations 03/25/2021   Sinus bradycardia 03/25/2021   Daytime somnolence 03/25/2021   Snoring 03/25/2021   Obesity (BMI 30-39.9) 03/25/2021   Port-A-Cath in place 01/27/2021   Genetic testing 12/24/2020   Family history of uterine cancer 12/17/2020   Malignant neoplasm of upper-outer quadrant of right breast in female, estrogen receptor positive ([place]) 12/12/2020   Malignant neoplasm of upper-outer  quadrant of left breast in female, estrogen receptor positive ([place]) 12/12/2020   Hypokalemia 01/02/2012   Asthma 01/01/2012   Bradycardia 01/01/2012   Syncope 12/31/2011   Mediastinal adenopathy 12/31/2011   Anemia 12/31/2011   Chest pain 12/31/2011    PCP: Juluis Mire  REFERRING PROVIDER: [name]  REFERRING DIAG: Malignant neoplasm of upper-outer quadrant of right breast in female, estrogen receptor positive ([place])  Malignant neoplasm of upper-outer quadrant of left breast in female, estrogen receptor positive ([place])  Lymphedema, not elsewhere classified  Metastasis to bone ([place])  Neoplasm related pain (acute) (chronic)  THERAPY DIAG:  Malignant neoplasm of upper-outer quadrant of right breast in female, estrogen receptor positive ([place])  Malignant neoplasm of upper-outer quadrant of left breast in female, estrogen receptor positive ([place])  Lymphedema, not elsewhere classified  Metastasis to bone ([place])  Neoplasm related pain (acute) (chronic)  ONSET DATE: 01/2021  Rationale for Evaluation and Treatment: Rehabilitation  SUBJECTIVE:                                                                                                                                                                                           SUBJECTIVE STATEMENT:  I am tired.  I was sick yesterday but I feel better today.  I did some of the exercises and I get them in most days. The exercises made me feel better.  I can get in and out of the bed properly, but I have some trouble with hip hinge.. I got the Flexi touch on Thursday but I haven't opened it yet. I moved from my apt and I am in a hotel temporarily.  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 Pt now  STAGE IV Breast Cancer with bone mets throughout thoracic and lumbar  regions and is pending radiation to her lumbar spine next week.    PAIN:  Are you having pain? Yes NPRS scale: 6/10 Pain location: hips and left ribs Pain orientation: Bilateral  PAIN TYPE: aching, sharp, throbbing, and tight Pain description: constant  Aggravating factors: standing, sleeping, walking, stairs, housework Relieving factors: Pain meds,changing positions,  PRECAUTIONS: BONE METS currently s/ppalli ative radiation to spine, bilateral breast lymphedema, bilateral UE lymphedema risk  WEIGHT BEARING RESTRICTIONS: No  FALLS:  Has patient fallen in last 6 months? No  LIVING ENVIRONMENT: Lives with: lives with their family and lives with an adult companion Lives in: House/apartment Stairs: Yes; Internal: 1 flight steps; on left going up Has following equipment at home: Single point cane, Walker - 4 wheeled, shower chair, and bed side commode  OCCUPATION: mgr at [employer]  LEISURE: shop, dominoes  HAND DOMINANCE: right   PRIOR LEVEL OF FUNCTION: Independent  PATIENT GOALS:   Get back to life, keep breast swelling down, learn exercises to strengthen my back/core,keep shoulder ROM on the right track   OBJECTIVE:  COGNITION: Overall cognitive status: Within functional limits for tasks assessed   PALPATION:   OBSERVATIONS / OTHER ASSESSMENTS: bilateral breasts with enlarged pores but improved since prior treatment session  SENSATION: Light touch: Appears intact   POSTURE: forward head, rounded shoulders  UPPER EXTREMITY AROM/PROM:  A/PROM RIGHT   eval   Shoulder extension 55  Shoulder flexion 157  Shoulder abduction 174  Shoulder internal rotation 65  Shoulder external rotation 98     (Blank rows = not tested)  A/PROM LEFT   eval  Shoulder extension 56  Shoulder flexion 156  Shoulder abduction 172  Shoulder internal rotation 65  Shoulder external rotation 95    (Blank rows = not tested)  CERVICAL AROM: All within functional limits:    LUMBAR ROM: Lumbar ROM without increasing pain; Bilateral SB decreased 25%, FB decreased 90%, BB decreased 90% all with pain  ANKLE DF : right -12,  LEFT  -8  HS Length: 30 deg Bilaterally  UPPER EXTREMITY STRENGTH: WFL  LOWER EXTREMITY MMT: WFL     LYMPHEDEMA ASSESSMENTS:   SURGERY TYPE/DATE: 07/09/2021 BILATERAL BREAST LUMPECTOMY WITH RADIOACTIVE SEED LOCALIZATION RADIOACTIVE SEED GUIDED RIGHT AXILLARY TARGETED LYMPH NODE DISSECTION LEFT AXILLARY SENTINEL NODE BIOPSY LEFT AXILLARY LYMPH NODE MAPPING WITH SENTIMAG    NUMBER OF LYMPH NODES REMOVED: Right 1/1, Left 0/1   CHEMOTHERAPY:  neoadjuvant    RADIATION:yes bilateral breast, presently having palliative radiation for bone METS to spine    HORMONE TREATMENT: YES  INFECTIONS: NO  LYMPHEDEMA ASSESSMENTS:     LANDMARK RIGHT  03/04/2022 Right   10 cm proximal to olecranon process 30.5   Olecranon process 27.1   10 cm proximal to ulnar styloid process 21.2   Just proximal to ulnar styloid process 16.6   Across hand at thumb web space 21.4   At base of 2nd digit 6.8   (Blank rows = not tested)   LANDMARK LEFT  03/04/2022 LEFT  10 cm proximal to olecranon process 30.7   Olecranon process 26.7   10 cm proximal to ulnar styloid process 20.5   Just proximal to ulnar styloid process 16.3   Across hand at thumb web space 21.1   At base of 2nd digit 6.7   (Blank rows = not tested)      GAIT: Distance walked: 20 feet Assistive device utilized: None Level of assistance: Complete Independence Comments: decreased heel strike, toe off, sideways  translation with some LOB  QUICK DASH SURVEY: not completed   TODAY'S TREATMENT:                                                                                                                                          DATE:  07/13/2022  Meeks decompression exercises x 5 Tra contraction with alternating arm raises x 10 ea Tra contraction with bilateral shoulder flexion, scaption, horizontal abduction x5 Tra activation and alternate LE marching x 10, ball squeeze B, hip abd bilaterally x 10 Supine gentle figure 4 stretch x 3 bilaterally x 5-10 sec Supine wand flexion and scaption x5   07/06/2022  Instructed in sit to and from supine and practiced x 3 and hip hinge to protect back with activities at home. Initiated Meeks decompression exercises x 5 ea with 2-3 second hold and updated HEP Sitting heel and toe raises x 10 ea Sitting gastroc stretch x 3 with 20 sec hold Standing scapular retraction with yellow band x 10 Added all above to HEP. Advised pt she should never have pain with exercises and to back off if she does,   06/25/2022  Evaluation only  PATIENT EDUCATION:  Education details: discussed POC/activities that will be performed Person educated: Patient Education method: Explanation Education comprehension: verbalized understanding  HOME EXERCISE PROGRAM: None given  ASSESSMENT:  CLINICAL IMPRESSION: Pt did very well with new stabilization exs instructed and noted tightness in her stomach and feeling good after exercises. She had no pain with any exercises.   OBJECTIVE IMPAIRMENTS: Abnormal gait, decreased activity tolerance, decreased balance, decreased ROM, decreased strength, increased edema, impaired sensation, improper body mechanics, and postural dysfunction.   ACTIVITY LIMITATIONS: lifting, bending, standing, sleeping, stairs, bed mobility, and locomotion level  PARTICIPATION LIMITATIONS: cleaning, laundry, shopping, and occupation  PERSONAL FACTORS: 3+ comorbidities: bilateral Breast Cancer s/p Radiation with new bone Metastasis and palliative radiation for pain   are also affecting patient's functional outcome.   REHAB POTENTIAL: Good  CLINICAL DECISION MAKING: Evolving/moderate complexity  EVALUATION COMPLEXITY: Low  GOALS: Goals reviewed with patient? Yes  SHORT TERM GOALS: Target date: 07/22/2022  Pt will be independent in a HEP for gentle lumbar/core neutral spine activities and LE flexibility Baseline: Goal status: INITIAL  2.  Pt will be able to get in and out of bed and brush teeth with good body mechanics Baseline: no knowledge Goal status: INITIAL  LONG TERM GOALS: Target date: 2/29/2023  Pt will be approved for Flexi touch Sequential compression pump for management of bilateral breast edema Baseline:  Goal status: INITIAL  2.  Pt will have AROM for bilateral DF improved to atleast 5 degrees for improved gait pattern Baseline:  Goal status: INITIAL  3.  Pt will be educated in proper body mechanics for home activities to prevent injury Baseline: no knowledge Goal status: INITIAL  4.  Pt will ambulate with improved heal strike /  toe off and decreased sideways translation for improved energy conservation Baseline:  Goal status: INITIAL    PLAN:  PT FREQUENCY: 1-2x/week  PT DURATION: 8 weeks  PLANNED INTERVENTIONS: Therapeutic exercises, Therapeutic activity, Neuromuscular re-education, Balance training, Patient/Family education, Self Care, Orthotic/Fit training, Aquatic Therapy, Manual lymph drainage, Manual therapy, and Re-evaluation  PLAN FOR NEXT SESSION: Recert, Flexi set up in clinic?hip hinge, gastroc and HS stretches, Meeks decompression exercises, gentle core strength, STM prn for chest/muscular tightness, shoulder ROM prn, update HEP with supine stab exs, add standing theraband exs   Claris Pong, PT 07/13/2022, 12:20 PM

## 2022-07-15 NOTE — Radiation Completion Notes (Signed)
Patient Name: Chelsea Hansen, Chelsea Hansen MRN: [id number] Date of Birth: [birth year] Referring Physician: [name], M.D. Date of Service: 2022-07-15 Radiation Oncologist: Teryl Lucy, M.D. [place] [facility] END OF TREATMENT NOTE     Diagnosis: C79.51 Secondary malignant neoplasm of bone Staging on 2021-07-09: Malignant neoplasm of upper-outer quadrant of right breast in female, estrogen receptor positive ([place]) T=pT2, N=pN1a, M=cM0 Staging on 2020-12-17: Malignant neoplasm of upper-outer quadrant of right breast in female, estrogen receptor positive ([place]) T=cT2, N=cN1, M=cM0 Staging on 2021-07-09: Malignant neoplasm of upper-outer quadrant of left breast in female, estrogen receptor positive ([place]) T=pTis (DCIS), N=pN0, M=cM0 Staging on 2020-12-17: Malignant neoplasm of upper-outer quadrant of left breast in female, estrogen receptor positive ([place]) T=cT1b, N=cN0, M=cM0 Intent: Palliative     ==========DELIVERED PLANS==========  First Treatment Date: 2022-06-23 - Last Treatment Date: 2022-07-07   Plan Name: Spine_LS_Pelv Site: Lumbar Spine Technique: 3D Mode: Photon Dose Per Fraction: 3 Gy Prescribed Dose (Delivered / Prescribed): 30 Gy / 30 Gy Prescribed Fxs (Delivered / Prescribed): 10 / 10     ==========ON TREATMENT VISIT DATES========== 2022-06-30, 2022-07-07     ==========UPCOMING VISITS==========       ==========APPENDIX - ON TREATMENT VISIT NOTES==========   See weekly On Treatment Notes is Epic for details.

## 2022-07-16 ENCOUNTER — Other Ambulatory Visit (HOSPITAL_COMMUNITY): Payer: Self-pay

## 2022-07-17 ENCOUNTER — Other Ambulatory Visit (HOSPITAL_COMMUNITY): Payer: Self-pay

## 2022-07-20 ENCOUNTER — Ambulatory Visit: Payer: Medicaid Other

## 2022-07-22 ENCOUNTER — Ambulatory Visit: Payer: Medicaid Other | Attending: Radiation Oncology

## 2022-07-22 DIAGNOSIS — L599 Disorder of the skin and subcutaneous tissue related to radiation, unspecified: Secondary | ICD-10-CM | POA: Diagnosis present

## 2022-07-22 DIAGNOSIS — R209 Unspecified disturbances of skin sensation: Secondary | ICD-10-CM | POA: Insufficient documentation

## 2022-07-22 DIAGNOSIS — C50411 Malignant neoplasm of upper-outer quadrant of right female breast: Secondary | ICD-10-CM | POA: Insufficient documentation

## 2022-07-22 DIAGNOSIS — C7951 Secondary malignant neoplasm of bone: Secondary | ICD-10-CM | POA: Diagnosis present

## 2022-07-22 DIAGNOSIS — R293 Abnormal posture: Secondary | ICD-10-CM | POA: Insufficient documentation

## 2022-07-22 DIAGNOSIS — G893 Neoplasm related pain (acute) (chronic): Secondary | ICD-10-CM | POA: Diagnosis present

## 2022-07-22 DIAGNOSIS — C50412 Malignant neoplasm of upper-outer quadrant of left female breast: Secondary | ICD-10-CM | POA: Diagnosis present

## 2022-07-22 DIAGNOSIS — I890 Lymphedema, not elsewhere classified: Secondary | ICD-10-CM | POA: Diagnosis present

## 2022-07-22 DIAGNOSIS — Z170 Estrogen receptor positive status [ER+]: Secondary | ICD-10-CM | POA: Insufficient documentation

## 2022-07-22 NOTE — Therapy (Addendum)
OUTPATIENT PHYSICAL THERAPY ONCOLOGY EVALUATION  Patient Name: Chelsea Hansen MRN: [id number] DOB:1970-06-30, 52 y.o., female Today's Date: 07/22/2022  END OF SESSION:  PT End of Session - 07/22/22 0759     Visit Number 4    Number of Visits 16    Date for PT Re-Evaluation 08/20/22    Authorization Type Medicaid    Authorization - Visit Number 3    Authorization - Number of Visits 3    Progress Note Due on Visit 4    PT Start Time 0801    PT Stop Time 5102    PT Time Calculation (min) 53 min    Activity Tolerance Patient tolerated treatment well    Behavior During Therapy [facility] for tasks assessed/performed             Past Medical History:  Diagnosis Date   Allergy    seasonal allergies   Anxiety    on meds   Asthma    uses inhaler   Breast cancer ([place]) 2012   RIGHT lumpectomy   Cancer ([place]) 2022   RIGHT breast lump-dx 2022   Depression    on meds   DVT (deep venous thrombosis) ([place]) 2010   after hysterectomy   Family history of uterine cancer    GERD (gastroesophageal reflux disease)    with certain foods/OTC PRN meds   Headache(784.0)    History of radiation therapy    Bilateral breast- 09/03/21-11/02/21- Dr. [name]   Hyperlipidemia    on meds   Hypertension    on meds   SVT (supraventricular tachycardia)    Past Surgical History:  Procedure Laterality Date   ABDOMINAL HYSTERECTOMY     AXILLARY SENTINEL NODE BIOPSY Left 07/09/2021   Procedure: LEFT AXILLARY SENTINEL NODE BIOPSY;  Surgeon: [name], MD;  Location: [place];  Service: General;  Laterality: Left;   BIOPSY  06/29/2022   Procedure: BIOPSY;  Surgeon: Doran Stabler, MD;  Location: [facility];  Service: Gastroenterology;;   BREAST EXCISIONAL BIOPSY Right 09/16/2009   BREAST LUMPECTOMY WITH RADIOACTIVE SEED LOCALIZATION Bilateral 07/09/2021   Procedure: BILATERAL BREAST LUMPECTOMY WITH RADIOACTIVE SEED LOCALIZATION;  Surgeon: [name], MD;  Location:  [place];  Service: General;  Laterality: Bilateral;   BREAST SURGERY     lumpectomy   ESOPHAGOGASTRODUODENOSCOPY (EGD) WITH PROPOFOL N/A 06/29/2022   Procedure: ESOPHAGOGASTRODUODENOSCOPY (EGD) WITH PROPOFOL;  Surgeon: Doran Stabler, MD;  [facility]: [facility];  Service: Gastroenterology;  Laterality: N/A;   PORT-A-CATH REMOVAL Left 07/09/2021   Procedure: REMOVAL PORT-A-CATH;  Surgeon: [name], MD;  Location: [place];  Service: General;  Laterality: Left;   PORTACATH PLACEMENT Left 01/26/2021   Procedure: INSERTION PORT-A-CATH;  Surgeon: [name], MD;  Location: [facility];  Service: General;  Laterality: Left;   RADIOACTIVE SEED GUIDED AXILLARY SENTINEL LYMPH NODE Right 07/09/2021   Procedure: RADIOACTIVE SEED GUIDED RIGHT AXILLARY SENTINEL LYMPH NODE DISSECTION;  Surgeon: [name], MD;  Location: [place];  Service: General;  Laterality: Right;   TUBAL LIGATION     Patient Active Problem List   Diagnosis Date Noted   Chronic constipation 06/30/2022   Nausea and vomiting in adult 06/29/2022   Hematemesis 06/28/2022   Metastasis to bone ([place]) 05/20/2022   PSVT (paroxysmal supraventricular tachycardia) 07/08/2021   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   GERD (gastroesophageal reflux disease) 05/27/2021   Hyperlipidemia 05/27/2021   Hypertension 05/27/2021   Pain management  contract signed 05/04/2021   Chronic pain syndrome (breast cancer) 05/04/2021   Palpitations 03/25/2021   Sinus bradycardia 03/25/2021   Daytime somnolence 03/25/2021   Snoring 03/25/2021   Obesity (BMI 30-39.9) 03/25/2021   Port-A-Cath in place 01/27/2021   Genetic testing 12/24/2020   Family history of uterine cancer 12/17/2020   Malignant neoplasm of upper-outer quadrant of right breast in female, estrogen receptor positive ([place]) 12/12/2020   Malignant neoplasm of upper-outer quadrant of left breast in female, estrogen  receptor positive ([place]) 12/12/2020   Hypokalemia 01/02/2012   Asthma 01/01/2012   Bradycardia 01/01/2012   Syncope 12/31/2011   Mediastinal adenopathy 12/31/2011   Anemia 12/31/2011   Chest pain 12/31/2011    PCP: Juluis Mire  REFERRING PROVIDER: [name]  REFERRING DIAG: Malignant neoplasm of upper-outer quadrant of right breast in female, estrogen receptor positive ([place])  Malignant neoplasm of upper-outer quadrant of left breast in female, estrogen receptor positive ([place])  Lymphedema, not elsewhere classified  Metastasis to bone ([place])  Neoplasm related pain (acute) (chronic)  THERAPY DIAG:  Malignant neoplasm of upper-outer quadrant of right breast in female, estrogen receptor positive ([place])  Malignant neoplasm of upper-outer quadrant of left breast in female, estrogen receptor positive ([place])  Lymphedema, not elsewhere classified  Metastasis to bone ([place])  Neoplasm related pain (acute) (chronic)  ONSET DATE: 01/2021  Rationale for Evaluation and Treatment: Rehabilitation  SUBJECTIVE:                                                                                                                                                                                           SUBJECTIVE STATEMENT:  I weaned myself off all the cancer meds and others last week and this week I started back on my 2 cancer meds and I am feeling better. I lost over 20 lbs in 2 weeks because I was taking 20 different meds and they were making me so sick..  I had to find out which of the multiple meds was hurting me. I got sick 2 times this week but not like I was.  I did some of the exercises and I get them in most days. I have been moving all week. The exercises made me feel better.  I can get in and out of the bed properly, but I have some trouble with hip hinge.. I got the Flexi touch on Thursday but I haven't opened it yet. I moved from my apt and I am in a hotel temporarily so I would like to  have the demo here if possible.  PERTINENT HISTORY:  Neoadjuvant chemotherapy with Doxorubicin and Cyclophosphamide given x 4  beginning 01/27/2021 and completing on 03/10/2021 followed by weekly paclitaxel x 12 beginning 05/2021 07/09/2021: Right lumpectomy: Grade 2 invasive lobular cancer, 2.5 cm, LCIS, margins negative, LCIS focally at anterior margin, 1/1 lymph node positive, ER 95%, PR 100%, HER2 negative, Ki-67 40% Left lumpectomy: High-grade DCIS: 0.7 cm, margins negative, 0/1 lymph node negative ER 95%, PR 100%. Pt now  STAGE IV Breast Cancer with bone mets throughout thoracic and lumbar regions and is pending radiation to her lumbar spine next week.    PAIN:  Are you having pain? Yes NPRS scale: 7/10 its cold and my bones hurt, yesterday I was a 0/10 at best. Pain location: hips and left ribs greatest on the left Pain orientation: Bilateral  PAIN TYPE: aching, sharp, throbbing, and tight Pain description: constant  Aggravating factors: standing, sleeping, walking, stairs, housework Relieving factors: Pain meds,changing positions,  PRECAUTIONS: BONE METS currently s/p palliative radiation to spine, bilateral breast lymphedema, bilateral UE lymphedema risk  WEIGHT BEARING RESTRICTIONS: No  FALLS:  Has patient fallen in last 6 months? No  LIVING ENVIRONMENT: Lives with: currently living alone Lives DG:LOVFI presently  Has following equipment : Single point cane, Walker - 4 wheeled, shower chair, and bed side commode  OCCUPATION: mgr at [employer]  LEISURE: shop, dominoes  HAND DOMINANCE: right   PRIOR LEVEL OF FUNCTION: Independent  PATIENT GOALS:   Get back to life, keep breast swelling down, learn exercises to strengthen my back/core,keep shoulder ROM on the right track   OBJECTIVE:  COGNITION: Overall cognitive status: Within functional limits for tasks assessed   PALPATION:   OBSERVATIONS / OTHER ASSESSMENTS: bilateral breasts with enlarged pores but improved since  prior treatment session  SENSATION: Light touch: Appears intact   POSTURE: forward head, rounded shoulders  UPPER EXTREMITY AROM/PROM:  A/PROM RIGHT   eval   Shoulder extension 55  Shoulder flexion 157  Shoulder abduction 174  Shoulder internal rotation 65  Shoulder external rotation 98    (Blank rows = not tested)  A/PROM LEFT   eval  Shoulder extension 56  Shoulder flexion 156  Shoulder abduction 172  Shoulder internal rotation 65  Shoulder external rotation 95    (Blank rows = not tested)  CERVICAL AROM: All within functional limits:    LUMBAR ROM: Lumbar ROM without increasing pain; Bilateral SB decreased 25%, FB decreased 90%, BB decreased 90% all with pain  ANKLE  AROM DF : right -12,  LEFT  -8,   , Jul 22, 2022 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  HS Length: 30 deg Bilaterally, 07/22/2022  Left   35    Degrees           Right 45 Degrees  UPPER EXTREMITY STRENGTH: WFL  LOWER EXTREMITY MMT: WFL     LYMPHEDEMA ASSESSMENTS:   SURGERY TYPE/DATE: 07/09/2021 BILATERAL BREAST LUMPECTOMY WITH RADIOACTIVE SEED LOCALIZATION RADIOACTIVE SEED GUIDED RIGHT AXILLARY TARGETED LYMPH NODE DISSECTION LEFT AXILLARY SENTINEL NODE BIOPSY LEFT AXILLARY LYMPH NODE MAPPING WITH SENTIMAG    NUMBER OF LYMPH NODES REMOVED: Right 1/1, Left 0/1   CHEMOTHERAPY:  neoadjuvant    RADIATION:yes bilateral breast, recently finished palliative radiation for bone METS to spine    HORMONE TREATMENT: YES  INFECTIONS: NO  LYMPHEDEMA ASSESSMENTS:     LANDMARK RIGHT  03/04/2022 Right   10 cm proximal to olecranon process 30.5   Olecranon process 27.1   10 cm proximal to ulnar styloid process 21.2   Just proximal to ulnar styloid process 16.6   Across hand  at thumb web space 21.4   At base of 2nd digit 6.8   (Blank rows = not tested)   LANDMARK LEFT  03/04/2022 LEFT  10 cm proximal to olecranon process 30.7   Olecranon process 26.7   10 cm proximal to ulnar styloid process 20.5    Just proximal to ulnar styloid process 16.3   Across hand at thumb web space 21.1   At base of 2nd digit 6.7   (Blank rows = not tested)      GAIT: Distance walked: 20 feet Assistive device utilized: None Level of assistance: Complete Independence Comments: decreased heel strike, toe off, sideways translation with some LOB  QUICK DASH SURVEY: not completed   TODAY'S TREATMENT:                                                                                                                                         DATE:   07/22/2022 Reviewed in and out of bed and hip hinge practicing hinge at counter to simulate brushing teeth Meeks decompression x 5ea to warm up HS stretch with strap supine 3 x 20 sec B,  right leg 45 degrees, left 36 degrees Bilateral gastroc stretch supine with strap 3 x 20 sec,   Supine TrA with march, x 10, March with opposite arm raises 2 x 10 Assessed DF and HS ROM and checked goals  07/13/2022 Meeks decompression exercises x 5 Tra contraction with alternating arm raises x 10 ea Tra contraction with bilateral shoulder flexion, scaption, horizontal abduction x5 Tra activation and alternate LE marching x 10, ball squeeze B, hip abd bilaterally x 10 Supine gentle figure 4 stretch x 3 bilaterally x 5-10 sec Supine wand flexion and scaption x5   07/06/2022  Instructed in sit to and from supine and practiced x 3 and hip hinge to protect back with activities at home. Initiated Meeks decompression exercises x 5 ea with 2-3 second hold and updated HEP Sitting heel and toe raises x 10 ea Sitting gastroc stretch x 3 with 20 sec hold Standing scapular retraction with yellow band x 10 Added all above to HEP. Advised pt she should never have pain with exercises and to back off if she does,   06/25/2022  Evaluation only  PATIENT EDUCATION:  Education details: discussed POC/activities that will be performedAccess Code: [email or web address] URL:  [email or web address] Date: 07/22/2022 Prepared by: Cheral Almas  Exercises - Supine Hamstring Stretch with Strap  - 1 x daily - 7 x weekly - 3 sets - 10 reps, Meeks decompression x 5 Person educated: Patient Education method: Explanation Education comprehension: verbalized understanding  HOME EXERCISE PROGRAM: None given  ASSESSMENT:  CLINICAL IMPRESSION: Pt has just completed her first 3 visits in 30 days. She has been compliant with her HEP, and does note that it makes her feel better to do the exercises. She can get in and out of the  bed with good technique and is improving with hip hinge for home activities to prevent further injury to her back. She still requires occasional Verbal cues and tactile cues to do correctly. Her HS length has improved by 5 degrees on the left and 15 degrees on the right. 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 She has been approved for her Flexi touch to help her with bilateral breast edema but has not been able to have it set up for her yet as she has recently moved. We will arrange for them to do it here for her. She will continue to benefit from skilled PT as we continue to safely progress her ROM, Gait, core strength, balance and body mechanics to enable her to continue living a healthy active lifestyle as she continues with her Cancer treatments.  OBJECTIVE IMPAIRMENTS: Abnormal gait, decreased activity tolerance, decreased balance, decreased ROM, decreased strength, increased edema, impaired sensation, improper body mechanics, and postural dysfunction.   ACTIVITY LIMITATIONS: lifting, bending, standing, sleeping, stairs, bed mobility, and locomotion level  PARTICIPATION LIMITATIONS: cleaning, laundry, shopping, and occupation  PERSONAL FACTORS: 3+ comorbidities: bilateral Breast Cancer s/p Radiation with new bone Metastasis and palliative radiation for pain  are also affecting patient's  functional outcome.   REHAB POTENTIAL: Good  CLINICAL DECISION MAKING: Evolving/moderate complexity  EVALUATION COMPLEXITY: Low  GOALS: Goals reviewed with patient? Yes  SHORT TERM GOALS: Target date: 07/22/2022  Pt will be independent in a HEP for gentle lumbar/core neutral spine activities and LE flexibility Baseline: Goal status:MET 07/22/2022  2.  Pt will be able to get in and out of bed and brush teeth with good body mechanics Baseline: no knowledge Goal status:MET for in/out of bed, nearly Met for hip hinge  LONG TERM GOALS: Target date: 2/29/2023  Pt will be approved for Flexi touch Sequential compression pump for management of bilateral breast edema Baseline:  Goal status:MET but not set up yet. Pt is moving 2.  Pt will have AROM for bilateral DF improved to atleast 5 degrees for improved gait pattern Baseline:  Goal status:In Progress  3.  Pt will be educated in proper body mechanics for home activities to prevent injury Baseline: no knowledge Goal status:In progress  4.  Pt will ambulate with improved heel strike /toe off and decreased sideways translation for improved energy conservation Baseline:  Goal status:In progress 5. Pt will be independent in and tolerant of higher level core strength/stability to decrease risk of injury  Baseline:no Knowledge  NEW  PLAN:  PT FREQUENCY: 2x/week  PT DURATION:6 weeks  PLANNED INTERVENTIONS: Therapeutic exercises, Therapeutic activity, Neuromuscular re-education, Balance training, Patient/Family education, Self Care, Orthotic/Fit training, Aquatic Therapy, Manual lymph drainage, Manual therapy, and Re-evaluation  PLAN FOR NEXT SESSION: Flexi set up in clinic?hip hinge,  cont gastroc and HS stretches,,test balance, gentle core strength, STM prn for chest/muscular tightness, shoulder ROM prn, update HEP with supine stab exs, add standing theraband exs   Claris Pong, PT 07/22/2022, 8:58 AM

## 2022-07-28 ENCOUNTER — Ambulatory Visit: Payer: Medicaid Other

## 2022-07-29 ENCOUNTER — Encounter: Payer: Self-pay | Admitting: Gastroenterology

## 2022-07-29 ENCOUNTER — Ambulatory Visit (INDEPENDENT_AMBULATORY_CARE_PROVIDER_SITE_OTHER): Payer: Medicaid Other | Admitting: Gastroenterology

## 2022-07-29 VITALS — BP 118/64 | HR 68 | Ht 71.0 in | Wt 207.0 lb

## 2022-07-29 DIAGNOSIS — R112 Nausea with vomiting, unspecified: Secondary | ICD-10-CM | POA: Diagnosis not present

## 2022-07-29 NOTE — Patient Instructions (Signed)
Continue take pantoprazole daily and zofran three times a day as needed.   The [facility] providers would like to encourage you to use [facility] to communicate with providers for non-urgent requests or questions.  Due to long hold times on the telephone, sending your provider a message by [facility] may be a faster and more efficient way to get a response.  Please allow 48 business hours for a response.  Please remember that this is for non-urgent requests.   Thank you for choosing me and [facility].  Pricilla Riffle. Dagoberto Ligas., MD., Marval Regal

## 2022-07-29 NOTE — Progress Notes (Addendum)
Assessment     Nausea, vomiting causing gastropathy with hematemesis - all symptoms resolved GERD Metastatic breast cancer to bone on oral chemotherapy, recently completed radiation therapy to spine Microcytic anemia   Recommendations    Continue pantoprazole 40 mg daily and follow antireflux measures.   Continue Zofran 4 mg qd and then bid as needed for 2 weeks then change to Zofran 4 mg po tid prn Follow-up with oncology for mgmt of anemia, breast cancer GI follow-up as needed   HPI    This is a 52 year old female returning for follow-up of nausea, vomiting and hematemesis.  She was seen as an inpatient hospital consult in January with the above symptoms.  EGD on Jan. 9, 2024 showed a nonobstructive Schatzki ring, a 2 cm hiatal hernia and gastric body congestion and erythema felt to be secondary to persistent vomiting.  She completed radiation therapy for metastatic breast cancer without 2 weeks ago.  She denies nausea or vomiting.  She states her appetite is good and she is eating well.  She denies abdominal pain.  She feels she has substantially improved since her hospitalization.   Labs / Imaging       Latest Ref Rng & Units 06/28/2022    8:10 PM 06/24/2022    8:32 AM 06/08/2022   10:41 AM  Hepatic Function  Total Protein 6.5 - 8.1 g/dL 8.1  6.7  8.0   Albumin 3.5 - 5.0 g/dL 4.4  4.0  4.5   AST 15 - 41 U/L '17  14  19   '$ ALT 0 - 44 U/L '17  12  20   '$ Alk Phosphatase 38 - 126 U/L 102  110  109   Total Bilirubin 0.3 - 1.2 mg/dL 0.5  0.4  0.6        Latest Ref Rng & Units 06/30/2022   11:50 AM 06/30/2022   11:49 AM 06/29/2022    7:49 AM  CBC  WBC 4.0 - 10.5 K/uL  2.1    Hemoglobin 12.0 - 15.0 g/dL 10.0  9.9  9.2   Hematocrit 36.0 - 46.0 % 32.0  31.1  30.1   Platelets 150 - 400 K/uL  294      CT HEAD W & WO CONTRAST (5MM)  Result Date: 06/29/2022 CLINICAL DATA:  Breast cancer rule out metastatic disease. Intractable nausea and vomiting EXAM: CT HEAD WITHOUT AND WITH  CONTRAST TECHNIQUE: Contiguous axial images were obtained from the base of the skull through the vertex without and with intravenous contrast. RADIATION DOSE REDUCTION: This exam was performed according to the departmental dose-optimization program which includes automated exposure control, adjustment of the mA and/or kV according to patient size and/or use of iterative reconstruction technique. CONTRAST:  81m OMNIPAQUE IOHEXOL 300 MG/ML  SOLN COMPARISON:  CT head 02/02/2021 FINDINGS: Brain: Ventricle size normal. No acute infarct, hemorrhage, mass. Hypodensity right frontal parietal lobe on axial images consistent with slightly prominent sulcus. No edema or mass-effect. Vascular: Negative for hyperdense vessel. Normal vascular enhancement Skull: No skeletal lesion identified. Sinuses/Orbits: Mild mucosal edema paranasal sinuses. Negative orbit Other: None IMPRESSION: Negative CT head with contrast. Electronically Signed   By: CFranchot GalloM.D.   On: 06/29/2022 17:52    Current Medications, Allergies, Past Medical History, Past Surgical History, Family History and Social History were reviewed in [employer].   Physical Exam: General: Well developed, well nourished, no acute distress Head: Normocephalic and atraumatic Eyes: Sclerae anicteric, EOMI Ears:  Normal auditory acuity Mouth: No deformities or lesions noted Lungs: Clear throughout to auscultation Heart: Regular rate and rhythm; No murmurs, rubs or bruits Abdomen: Soft, non tender and non distended. No masses, hepatosplenomegaly or hernias noted. Normal Bowel sounds Rectal: Not done Musculoskeletal: Symmetrical with no gross deformities  Pulses:  Normal pulses noted Extremities: No edema or deformities noted Neurological: Alert oriented x 4, grossly nonfocal Psychological:  Alert and cooperative. Normal mood and affect   Saron Tweed T. Fuller Plan, MD 07/29/2022, 9:41 AM

## 2022-08-03 ENCOUNTER — Other Ambulatory Visit (HOSPITAL_COMMUNITY): Payer: Self-pay

## 2022-08-04 ENCOUNTER — Ambulatory Visit: Payer: Medicaid Other

## 2022-08-04 DIAGNOSIS — I890 Lymphedema, not elsewhere classified: Secondary | ICD-10-CM

## 2022-08-04 DIAGNOSIS — Z170 Estrogen receptor positive status [ER+]: Secondary | ICD-10-CM

## 2022-08-04 DIAGNOSIS — G893 Neoplasm related pain (acute) (chronic): Secondary | ICD-10-CM

## 2022-08-04 DIAGNOSIS — C50411 Malignant neoplasm of upper-outer quadrant of right female breast: Secondary | ICD-10-CM | POA: Diagnosis not present

## 2022-08-04 DIAGNOSIS — C7951 Secondary malignant neoplasm of bone: Secondary | ICD-10-CM

## 2022-08-04 NOTE — Therapy (Signed)
OUTPATIENT PHYSICAL THERAPY ONCOLOGY EVALUATION  Patient Name: Chelsea Hansen MRN: [id number] DOB:1970/11/26, 53 y.o., female Today's Date: 08/04/2022  END OF SESSION:  PT End of Session - 08/04/22 0858     Visit Number 5    Number of Visits 16    Date for PT Re-Evaluation 09/09/22    Authorization Type Medicaid    Authorization - Visit Number 1   07/30/22- 09/09/22 new Josem Kaufmann   Authorization - Number of Visits 12    Progress Note Due on Visit 16   visit 12 of new authorization   PT Start Time 0900    PT Stop Time [id number]    PT Time Calculation (min) 55 min    Activity Tolerance Patient tolerated treatment well    Behavior During Therapy [facility] for tasks assessed/performed             Past Medical History:  Diagnosis Date   Allergy    seasonal allergies   Anxiety    on meds   Asthma    uses inhaler   Breast cancer ([place]) 2012   RIGHT lumpectomy   Cancer ([place]) 2022   RIGHT breast lump-dx 2022   Depression    on meds   DVT (deep venous thrombosis) ([place]) 2010   after hysterectomy   Family history of uterine cancer    GERD (gastroesophageal reflux disease)    with certain foods/OTC PRN meds   Headache(784.0)    History of radiation therapy    Bilateral breast- 09/03/21-11/02/21- Dr. [name]   Hyperlipidemia    on meds   Hypertension    on meds   SVT (supraventricular tachycardia)    Past Surgical History:  Procedure Laterality Date   ABDOMINAL HYSTERECTOMY     AXILLARY SENTINEL NODE BIOPSY Left 07/09/2021   Procedure: LEFT AXILLARY SENTINEL NODE BIOPSY;  Surgeon: [name], MD;  Location: [place];  Service: General;  Laterality: Left;   BIOPSY  06/29/2022   Procedure: BIOPSY;  Surgeon: Doran Stabler, MD;  Location: [facility];  Service: Gastroenterology;;   BREAST EXCISIONAL BIOPSY Right 09/16/2009   BREAST LUMPECTOMY WITH RADIOACTIVE SEED LOCALIZATION Bilateral 07/09/2021   Procedure: BILATERAL BREAST LUMPECTOMY WITH RADIOACTIVE SEED  LOCALIZATION;  Surgeon: [name], MD;  Location: [place];  Service: General;  Laterality: Bilateral;   BREAST SURGERY     lumpectomy   ESOPHAGOGASTRODUODENOSCOPY (EGD) WITH PROPOFOL N/A 06/29/2022   Procedure: ESOPHAGOGASTRODUODENOSCOPY (EGD) WITH PROPOFOL;  Surgeon: Doran Stabler, MD;  Location: [facility];  Service: Gastroenterology;  Laterality: N/A;   PORT-A-CATH REMOVAL Left 07/09/2021   Procedure: REMOVAL PORT-A-CATH;  Surgeon: [name], MD;  Location: [place];  Service: General;  Laterality: Left;   PORTACATH PLACEMENT Left 01/26/2021   Procedure: INSERTION PORT-A-CATH;  Surgeon: [name], MD;  Location: [facility];  Service: General;  Laterality: Left;   RADIOACTIVE SEED GUIDED AXILLARY SENTINEL LYMPH NODE Right 07/09/2021   Procedure: RADIOACTIVE SEED GUIDED RIGHT AXILLARY SENTINEL LYMPH NODE DISSECTION;  Surgeon: [name], MD;  Location: [place];  Service: General;  Laterality: Right;   TUBAL LIGATION     Patient Active Problem List   Diagnosis Date Noted   Chronic constipation 06/30/2022   Nausea and vomiting in adult 06/29/2022   Hematemesis 06/28/2022   Metastasis to bone ([place]) 05/20/2022   PSVT (paroxysmal supraventricular tachycardia) 07/08/2021   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   GERD (gastroesophageal reflux disease) 05/27/2021  Hyperlipidemia 05/27/2021   Hypertension 05/27/2021   Pain management contract signed 05/04/2021   Chronic pain syndrome (breast cancer) 05/04/2021   Palpitations 03/25/2021   Sinus bradycardia 03/25/2021   Daytime somnolence 03/25/2021   Snoring 03/25/2021   Obesity (BMI 30-39.9) 03/25/2021   Port-A-Cath in place 01/27/2021   Genetic testing 12/24/2020   Family history of uterine cancer 12/17/2020   Malignant neoplasm of upper-outer quadrant of right breast in female, estrogen receptor positive ([place]) 12/12/2020   Malignant neoplasm of  upper-outer quadrant of left breast in female, estrogen receptor positive ([place]) 12/12/2020   Hypokalemia 01/02/2012   Asthma 01/01/2012   Bradycardia 01/01/2012   Syncope 12/31/2011   Mediastinal adenopathy 12/31/2011   Anemia 12/31/2011   Chest pain 12/31/2011    PCP: Juluis Mire  REFERRING PROVIDER: [name]  REFERRING DIAG: Malignant neoplasm of upper-outer quadrant of right breast in female, estrogen receptor positive ([place])  Malignant neoplasm of upper-outer quadrant of left breast in female, estrogen receptor positive ([place])  Lymphedema, not elsewhere classified  Metastasis to bone ([place])  Neoplasm related pain (acute) (chronic)  THERAPY DIAG:  Malignant neoplasm of upper-outer quadrant of right breast in female, estrogen receptor positive ([place])  Malignant neoplasm of upper-outer quadrant of left breast in female, estrogen receptor positive ([place])  Lymphedema, not elsewhere classified  Metastasis to bone ([place])  Neoplasm related pain (acute) (chronic)  ONSET DATE: 01/2021  Rationale for Evaluation and Treatment: Rehabilitation  SUBJECTIVE:                                                                                                                                                                                           SUBJECTIVE STATEMENT:   I just got back from [place] yesterday. I am still feeling good since I weaned off some of the medicine.  My appetite has been good. My back hurt while I was away and my hips. I just took my pain meds..Pain has been better overall. I only take meds when I am really hurting. I need to make an appt with [employer]. My breasts got swolllen with the flights. I wasn't wearing my compression bra.   PERTINENT HISTORY:  Neoadjuvant chemotherapy with Doxorubicin and Cyclophosphamide given x 4 beginning 01/27/2021 and completing on 03/10/2021 followed by weekly paclitaxel x 12 beginning 05/2021 07/09/2021: Right lumpectomy: Grade 2  invasive lobular cancer, 2.5 cm, LCIS, margins negative, LCIS focally at anterior margin, 1/1 lymph node positive, ER 95%, PR 100%, HER2 negative, Ki-67 40% Left lumpectomy: High-grade DCIS: 0.7 cm, margins negative, 0/1 lymph node negative ER 95%, PR 100%. Pt now  STAGE IV Breast Cancer with  bone mets throughout thoracic and lumbar regions and is pending radiation to her lumbar spine next week.    PAIN:  Are you having pain? Yes NPRS scale: 5/10 today Pain location: LB Pain orientation: Bilateral  PAIN TYPE: aching, sharp,  Pain description:intermittent Aggravating factors: standing, sleeping, walking, stairs, housework Relieving factors: Pain meds,changing positions,  PRECAUTIONS: BONE METS currently s/p palliative radiation to spine, bilateral breast lymphedema, bilateral UE lymphedema risk  WEIGHT BEARING RESTRICTIONS: No  FALLS:  Has patient fallen in last 6 months? No  LIVING ENVIRONMENT: Lives with: currently living alone Lives [id number] presently  Has following equipment : Single point cane, Walker - 4 wheeled, shower chair, and bed side commode  OCCUPATION: mgr at [employer]  LEISURE: shop, dominoes  HAND DOMINANCE: right   PRIOR LEVEL OF FUNCTION: Independent  PATIENT GOALS:   Get back to life, keep breast swelling down, learn exercises to strengthen my back/core,keep shoulder ROM on the right track   OBJECTIVE:  COGNITION: Overall cognitive status: Within functional limits for tasks assessed   PALPATION:   OBSERVATIONS / OTHER ASSESSMENTS: bilateral breasts with enlarged pores but improved since prior treatment session  SENSATION: Light touch: Appears intact   POSTURE: forward head, rounded shoulders  UPPER EXTREMITY AROM/PROM:  A/PROM RIGHT   eval   Shoulder extension 55  Shoulder flexion 157  Shoulder abduction 174  Shoulder internal rotation 65  Shoulder external rotation 98    (Blank rows = not tested)  A/PROM LEFT   eval  Shoulder  extension 56  Shoulder flexion 156  Shoulder abduction 172  Shoulder internal rotation 65  Shoulder external rotation 95    (Blank rows = not tested)  CERVICAL AROM: All within functional limits:    LUMBAR ROM: Lumbar ROM without increasing pain; Bilateral SB decreased 25%, FB decreased 90%, BB decreased 90% all with pain  ANKLE  AROM DF : right -12,  LEFT  -8,   , Jul 22, 2022 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  HS Length: 30 deg Bilaterally, 07/22/2022  Left   35    Degrees           Right 45 Degrees  UPPER EXTREMITY STRENGTH: WFL  LOWER EXTREMITY MMT: WFL     LYMPHEDEMA ASSESSMENTS:   SURGERY TYPE/DATE: 07/09/2021 BILATERAL BREAST LUMPECTOMY WITH RADIOACTIVE SEED LOCALIZATION RADIOACTIVE SEED GUIDED RIGHT AXILLARY TARGETED LYMPH NODE DISSECTION LEFT AXILLARY SENTINEL NODE BIOPSY LEFT AXILLARY LYMPH NODE MAPPING WITH SENTIMAG    NUMBER OF LYMPH NODES REMOVED: Right 1/1, Left 0/1   CHEMOTHERAPY:  neoadjuvant    RADIATION:yes bilateral breast, recently finished palliative radiation for bone METS to spine    HORMONE TREATMENT: YES  INFECTIONS: NO  LYMPHEDEMA ASSESSMENTS:     LANDMARK RIGHT  03/04/2022 Right   10 cm proximal to olecranon process 30.5   Olecranon process 27.1   10 cm proximal to ulnar styloid process 21.2   Just proximal to ulnar styloid process 16.6   Across hand at thumb web space 21.4   At base of 2nd digit 6.8   (Blank rows = not tested)   LANDMARK LEFT  03/04/2022 LEFT  10 cm proximal to olecranon process 30.7   Olecranon process 26.7   10 cm proximal to ulnar styloid process 20.5   Just proximal to ulnar styloid process 16.3   Across hand at thumb web space 21.1   At base of 2nd digit 6.7   (Blank rows = not tested)      GAIT: Distance walked:  20 feet Assistive device utilized: None Level of assistance: Complete Independence Comments: decreased heel strike, toe off, sideways translation with some LOB  QUICK DASH SURVEY: not  completed   TODAY'S TREATMENT:                                                                                                                                         DATE:  08/04/2022 Meeks decompression x 5ea to warm up HS stretch with strap supine 3 x 20 sec B,  right leg 45 degrees, left 36 degrees Bilateral gastroc stretch supine with strap 3 x 30 sec,   Supine TrA with march,2x 5, March with opposite arm raises 2 x 10 March with opposite knee touches 2 x 10 TrA with heel slides x 10 ea TrA with hip adductor purple ball squeeze Supine wand flexion and scaption x 5    07/22/2022 Reviewed in and out of bed and hip hinge practicing hinge at counter to simulate brushing teeth Meeks decompression x 5ea to warm up HS stretch with strap supine 3 x 20 sec B,  right leg 45 degrees, left 36 degrees Bilateral gastroc stretch supine with strap 3 x 20 sec,   Supine TrA with march, x 10, March with opposite arm raises 2 x 10 Assessed DF and HS ROM and checked goals  07/13/2022 Meeks decompression exercises x 5 Tra contraction with alternating arm raises x 10 ea Tra contraction with bilateral shoulder flexion, scaption, horizontal abduction x5 Tra activation and alternate LE marching x 10, ball squeeze B, hip abd bilaterally x 10 Supine gentle figure 4 stretch x 3 bilaterally x 5-10 sec Supine wand flexion and scaption x5   07/06/2022  Instructed in sit to and from supine and practiced x 3 and hip hinge to protect back with activities at home. Initiated Meeks decompression exercises x 5 ea with 2-3 second hold and updated HEP Sitting heel and toe raises x 10 ea Sitting gastroc stretch x 3 with 20 sec hold Standing scapular retraction with yellow band x 10 Added all above to HEP. Advised pt she should never have pain with exercises and to back off if she does,   06/25/2022  Evaluation only  PATIENT EDUCATION:  Education details: discussed POC/activities that will be performedAccess  Code: [email or web address] URL: [email or web address] Date: 07/22/2022 Prepared by: Cheral Almas  Exercises - Supine Hamstring Stretch with Strap  - 1 x daily - 7 x weekly - 3 sets - 10 reps, Meeks decompression x 5 Person educated: Patient Education method: Explanation Education comprehension: verbalized understanding  HOME EXERCISE PROGRAM: None given  ASSESSMENT:  CLINICAL IMPRESSION: Pt did very well with new exercises instructed in clinic. Her Gastroc/soleus and HS continue to be very restricted. She noted discomfort in her abdominals but felt like she had a good work out. Her back pain was slightly improved. She had difficulty coordinating supine alternate UE and  LE raises at first. She developed some breast swelling in [place] and she is to put her compression bra on as soon as she returns home. Emailed Megan at [employer] to see if she can come on Friday to set up pts. Flexitouch.  OBJECTIVE IMPAIRMENTS: Abnormal gait, decreased activity tolerance, decreased balance, decreased ROM, decreased strength, increased edema, impaired sensation, improper body mechanics, and postural dysfunction.   ACTIVITY LIMITATIONS: lifting, bending, standing, sleeping, stairs, bed mobility, and locomotion level  PARTICIPATION LIMITATIONS: cleaning, laundry, shopping, and occupation  PERSONAL FACTORS: 3+ comorbidities: bilateral Breast Cancer s/p Radiation with new bone Metastasis and palliative radiation for pain  are also affecting patient's functional outcome.   REHAB POTENTIAL: Good  CLINICAL DECISION MAKING: Evolving/moderate complexity  EVALUATION COMPLEXITY: Low  GOALS: Goals reviewed with patient? Yes  SHORT TERM GOALS: Target date: 07/22/2022  Pt will be independent in a HEP for gentle lumbar/core neutral spine activities and LE flexibility Baseline: Goal status:MET 07/22/2022  2.  Pt will be able to get in and out of bed and brush teeth with good body mechanics Baseline: no  knowledge Goal status:MET for in/out of bed, nearly Met for hip hinge  LONG TERM GOALS: Target date: 2/29/2023  Pt will be approved for [employer] Sequential compression pump for management of bilateral breast edema Baseline:  Goal status:MET but not set up yet. Pt is moving 2.  Pt will have AROM for bilateral DF improved to atleast 5 degrees for improved gait pattern Baseline:  Goal status:In Progress  3.  Pt will be educated in proper body mechanics for home activities to prevent injury Baseline: no knowledge Goal status:In progress  4.  Pt will ambulate with improved heel strike /toe off and decreased sideways translation for improved energy conservation Baseline:  Goal status:In progress 5. Pt will be independent in and tolerant of higher level core strength/stability to decrease risk of injury  Baseline:no Knowledge  NEW  PLAN:  PT FREQUENCY: 2x/week  PT DURATION:6 weeks  PLANNED INTERVENTIONS: Therapeutic exercises, Therapeutic activity, Neuromuscular re-education, Balance training, Patient/Family education, Self Care, Orthotic/Fit training, Aquatic Therapy, Manual lymph drainage, Manual therapy, and Re-evaluation  PLAN FOR NEXT SESSION: check on compression sleeves,Flexi set up in clinic?hip hinge,  cont gastroc and HS stretches,,test balance, gentle core strength, STM prn for chest/muscular tightness, shoulder ROM prn, update HEP with supine stab exs, add standing theraband exs   Claris Pong, PT 08/04/2022, 10:00 AM

## 2022-08-06 ENCOUNTER — Ambulatory Visit: Payer: Medicaid Other

## 2022-08-06 ENCOUNTER — Other Ambulatory Visit (HOSPITAL_COMMUNITY): Payer: Self-pay

## 2022-08-06 DIAGNOSIS — C50411 Malignant neoplasm of upper-outer quadrant of right female breast: Secondary | ICD-10-CM | POA: Diagnosis not present

## 2022-08-06 DIAGNOSIS — I890 Lymphedema, not elsewhere classified: Secondary | ICD-10-CM

## 2022-08-06 DIAGNOSIS — R293 Abnormal posture: Secondary | ICD-10-CM

## 2022-08-06 DIAGNOSIS — C7951 Secondary malignant neoplasm of bone: Secondary | ICD-10-CM

## 2022-08-06 DIAGNOSIS — G893 Neoplasm related pain (acute) (chronic): Secondary | ICD-10-CM

## 2022-08-06 DIAGNOSIS — Z170 Estrogen receptor positive status [ER+]: Secondary | ICD-10-CM

## 2022-08-06 NOTE — Therapy (Signed)
OUTPATIENT PHYSICAL THERAPY ONCOLOGY EVALUATION  Patient Name: Chelsea Hansen MRN: [id number] DOB:04-21-1971, 52 y.o., female Today's Date: 08/06/2022  END OF SESSION:  PT End of Session - 08/06/22 1104     Visit Number 6    Number of Visits 16    Date for PT Re-Evaluation 09/09/22    Authorization Type Medicaid    Authorization Time Period 4 weeks    Authorization - Visit Number 2    Authorization - Number of Visits 12    Progress Note Due on Visit 16    PT Start Time 1104    PT Stop Time 1200    PT Time Calculation (min) 56 min    Activity Tolerance Patient tolerated treatment well    Behavior During Therapy WFL for tasks assessed/performed             Past Medical History:  Diagnosis Date   Allergy    seasonal allergies   Anxiety    on meds   Asthma    uses inhaler   Breast cancer ([place]) 2012   RIGHT lumpectomy   Cancer ([place]) 2022   RIGHT breast lump-dx 2022   Depression    on meds   DVT (deep venous thrombosis) ([place]) 2010   after hysterectomy   Family history of uterine cancer    GERD (gastroesophageal reflux disease)    with certain foods/OTC PRN meds   Headache(784.0)    History of radiation therapy    Bilateral breast- 09/03/21-11/02/21- Dr. [name]   Hyperlipidemia    on meds   Hypertension    on meds   SVT (supraventricular tachycardia)    Past Surgical History:  Procedure Laterality Date   ABDOMINAL HYSTERECTOMY     AXILLARY SENTINEL NODE BIOPSY Left 07/09/2021   Procedure: LEFT AXILLARY SENTINEL NODE BIOPSY;  Surgeon: [name], MD;  Location: [facility];  Service: General;  Laterality: Left;   BIOPSY  06/29/2022   Procedure: BIOPSY;  Surgeon: Doran Stabler, MD;  Location: [facility];  Service: Gastroenterology;;   BREAST EXCISIONAL BIOPSY Right 09/16/2009   BREAST LUMPECTOMY WITH RADIOACTIVE SEED LOCALIZATION Bilateral 07/09/2021   Procedure: BILATERAL BREAST LUMPECTOMY WITH RADIOACTIVE SEED LOCALIZATION;   Surgeon: [name], MD;  Location: [facility];  Service: General;  Laterality: Bilateral;   BREAST SURGERY     lumpectomy   ESOPHAGOGASTRODUODENOSCOPY (EGD) WITH PROPOFOL N/A 06/29/2022   Procedure: ESOPHAGOGASTRODUODENOSCOPY (EGD) WITH PROPOFOL;  Surgeon: Doran Stabler, MD;  [facility];  Service: Gastroenterology;  Laterality: N/A;   PORT-A-CATH REMOVAL Left 07/09/2021   Procedure: REMOVAL PORT-A-CATH;  Surgeon: [name], MD;  Location: [facility];  Service: General;  Laterality: Left;   PORTACATH PLACEMENT Left 01/26/2021   Procedure: INSERTION PORT-A-CATH;  Surgeon: [name], MD;  [facility]: [facility];  Service: General;  Laterality: Left;   RADIOACTIVE SEED GUIDED AXILLARY SENTINEL LYMPH NODE Right 07/09/2021   Procedure: RADIOACTIVE SEED GUIDED RIGHT AXILLARY SENTINEL LYMPH NODE DISSECTION;  Surgeon: [name], MD;  Location: [facility];  Service: General;  Laterality: Right;   TUBAL LIGATION     Patient Active Problem List   Diagnosis Date Noted   Chronic constipation 06/30/2022   Nausea and vomiting in adult 06/29/2022   Hematemesis 06/28/2022   Metastasis to bone ([place]) 05/20/2022   PSVT (paroxysmal supraventricular tachycardia) 07/08/2021   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   GERD (gastroesophageal reflux disease) 05/27/2021   Hyperlipidemia 05/27/2021  Hypertension 05/27/2021   Pain management contract signed 05/04/2021   Chronic pain syndrome (breast cancer) 05/04/2021   Palpitations 03/25/2021   Sinus bradycardia 03/25/2021   Daytime somnolence 03/25/2021   Snoring 03/25/2021   Obesity (BMI 30-39.9) 03/25/2021   Port-A-Cath in place 01/27/2021   Genetic testing 12/24/2020   Family history of uterine cancer 12/17/2020   Malignant neoplasm of upper-outer quadrant of right breast in female, estrogen receptor positive ([place]) 12/12/2020   Malignant neoplasm of upper-outer  quadrant of left breast in female, estrogen receptor positive ([place]) 12/12/2020   Hypokalemia 01/02/2012   Asthma 01/01/2012   Bradycardia 01/01/2012   Syncope 12/31/2011   Mediastinal adenopathy 12/31/2011   Anemia 12/31/2011   Chest pain 12/31/2011    PCP: Juluis Mire  REFERRING PROVIDER: [name]  REFERRING DIAG: Malignant neoplasm of upper-outer quadrant of right breast in female, estrogen receptor positive ([place])  Malignant neoplasm of upper-outer quadrant of left breast in female, estrogen receptor positive ([place])  Lymphedema, not elsewhere classified  Metastasis to bone ([place])  Neoplasm related pain (acute) (chronic)  THERAPY DIAG:  Malignant neoplasm of upper-outer quadrant of right breast in female, estrogen receptor positive ([place])  Malignant neoplasm of upper-outer quadrant of left breast in female, estrogen receptor positive ([place])  Lymphedema, not elsewhere classified  Metastasis to bone ([place])  Neoplasm related pain (acute) (chronic)  Abnormal posture  ONSET DATE: 01/2021  Rationale for Evaluation and Treatment: Rehabilitation  SUBJECTIVE:                                                                                                                                                                                           SUBJECTIVE STATEMENT:   I was really tired after the exercises last  time and my abs were sore, but it felt good.  PERTINENT HISTORY:  Neoadjuvant chemotherapy with Doxorubicin and Cyclophosphamide given x 4 beginning 01/27/2021 and completing on 03/10/2021 followed by weekly paclitaxel x 12 beginning 05/2021 07/09/2021: Right lumpectomy: Grade 2 invasive lobular cancer, 2.5 cm, LCIS, margins negative, LCIS focally at anterior margin, 1/1 lymph node positive, ER 95%, PR 100%, HER2 negative, Ki-67 40% Left lumpectomy: High-grade DCIS: 0.7 cm, margins negative, 0/1 lymph node negative ER 95%, PR 100%. Pt now  STAGE IV Breast Cancer with  bone mets throughout thoracic and lumbar regions and is pending radiation to her lumbar spine next week.    PAIN:  Are you having pain? Yes NPRS scale: 7/10 today legs, back, Pain location: LB Pain orientation: Bilateral  PAIN TYPE: aching, sharp,  Pain description:intermittent Aggravating factors: standing, sleeping, walking, stairs, housework Relieving factors: Pain meds,changing positions,  PRECAUTIONS: BONE METS currently s/p palliative radiation to spine, bilateral breast lymphedema, bilateral UE lymphedema risk  WEIGHT BEARING RESTRICTIONS: No  FALLS:  Has patient fallen in last 6 months? No  LIVING ENVIRONMENT: Lives with: currently living alone Lives [id number] presently  Has following equipment : Single point cane, Walker - 4 wheeled, shower chair, and bed side commode  OCCUPATION: mgr at [employer]  LEISURE: shop, dominoes  HAND DOMINANCE: right   PRIOR LEVEL OF FUNCTION: Independent  PATIENT GOALS:   Get back to life, keep breast swelling down, learn exercises to strengthen my back/core,keep shoulder ROM on the right track   OBJECTIVE:  COGNITION: Overall cognitive status: Within functional limits for tasks assessed   PALPATION:   OBSERVATIONS / OTHER ASSESSMENTS: bilateral breasts with enlarged pores but improved since prior treatment session  SENSATION: Light touch: Appears intact   POSTURE: forward head, rounded shoulders  UPPER EXTREMITY AROM/PROM:  A/PROM RIGHT   eval   Shoulder extension 55  Shoulder flexion 157  Shoulder abduction 174  Shoulder internal rotation 65  Shoulder external rotation 98    (Blank rows = not tested)  A/PROM LEFT   eval  Shoulder extension 56  Shoulder flexion 156  Shoulder abduction 172  Shoulder internal rotation 65  Shoulder external rotation 95    (Blank rows = not tested)  CERVICAL AROM: All within functional limits:    LUMBAR ROM: Lumbar ROM without increasing pain; Bilateral SB decreased 25%, FB  decreased 90%, BB decreased 90% all with pain  ANKLE  AROM DF : right -12,  LEFT  -8,   , Jul 22, 2022 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  HS Length: 30 deg Bilaterally, 07/22/2022  Left   35    Degrees           Right 45 Degrees  UPPER EXTREMITY STRENGTH: WFL  LOWER EXTREMITY MMT: WFL     LYMPHEDEMA ASSESSMENTS:   SURGERY TYPE/DATE: 07/09/2021 BILATERAL BREAST LUMPECTOMY WITH RADIOACTIVE SEED LOCALIZATION RADIOACTIVE SEED GUIDED RIGHT AXILLARY TARGETED LYMPH NODE DISSECTION LEFT AXILLARY SENTINEL NODE BIOPSY LEFT AXILLARY LYMPH NODE MAPPING WITH SENTIMAG    NUMBER OF LYMPH NODES REMOVED: Right 1/1, Left 0/1   CHEMOTHERAPY:  neoadjuvant    RADIATION:yes bilateral breast, recently finished palliative radiation for bone METS to spine    HORMONE TREATMENT: YES  INFECTIONS: NO  LYMPHEDEMA ASSESSMENTS:     LANDMARK RIGHT  03/04/2022 Right   10 cm proximal to olecranon process 30.5   Olecranon process 27.1   10 cm proximal to ulnar styloid process 21.2   Just proximal to ulnar styloid process 16.6   Across hand at thumb web space 21.4   At base of 2nd digit 6.8   (Blank rows = not tested)   LANDMARK LEFT  03/04/2022 LEFT  10 cm proximal to olecranon process 30.7   Olecranon process 26.7   10 cm proximal to ulnar styloid process 20.5   Just proximal to ulnar styloid process 16.3   Across hand at thumb web space 21.1   At base of 2nd digit 6.7   (Blank rows = not tested)      GAIT: Distance walked: 20 feet Assistive device utilized: None Level of assistance: Complete Independence Comments: decreased heel strike, toe off, sideways translation with some LOB  QUICK DASH SURVEY: not completed   TODAY'S TREATMENT:  DATE:  08/06/2022 TrA with marching x 10 TrA with march and alternate UE flexion TrA march with alternate knee touch x10  ea side Bridge 2 x 5 Hip adductor ball squeeze x10 HS stretch with strap x 3 x 30 sec Supine horizontal abduction with TrA x 10 Standing Scapular retraction red with TrA x 10 Marching with TrA 2 x 10 Heel raises x 10 Tandem stance bilaterally x 20 Step and hold on ax forward and sideways x 5 B 08/04/2022 Meeks decompression x 5ea to warm up HS stretch with strap supine 3 x 20 sec B,  right leg 45 degrees, left 36 degrees Bilateral gastroc stretch supine with strap 3 x 30 sec,   Supine TrA with march,2x 5, March with opposite arm raises 2 x 10 March with opposite knee touches 2 x 10 TrA with heel slides x 10 ea TrA with hip adductor purple ball squeeze Supine wand flexion and scaption x 5    07/22/2022 Reviewed in and out of bed and hip hinge practicing hinge at counter to simulate brushing teeth Meeks decompression x 5ea to warm up HS stretch with strap supine 3 x 20 sec B,  right leg 45 degrees, left 36 degrees Bilateral gastroc stretch supine with strap 3 x 20 sec,   Supine TrA with march, x 10, March with opposite arm raises 2 x 10 Assessed DF and HS ROM and checked goals  07/13/2022 Meeks decompression exercises x 5 Tra contraction with alternating arm raises x 10 ea Tra contraction with bilateral shoulder flexion, scaption, horizontal abduction x5 Tra activation and alternate LE marching x 10, ball squeeze B, hip abd bilaterally x 10 Supine gentle figure 4 stretch x 3 bilaterally x 5-10 sec Supine wand flexion and scaption x5   07/06/2022  Instructed in sit to and from supine and practiced x 3 and hip hinge to protect back with activities at home. Initiated Meeks decompression exercises x 5 ea with 2-3 second hold and updated HEP Sitting heel and toe raises x 10 ea Sitting gastroc stretch x 3 with 20 sec hold Standing scapular retraction with yellow band x 10 Added all above to HEP. Advised pt she should never have pain with exercises and to back off if she  does,   06/25/2022  Evaluation onl  PATIENT EDUCATION:  08/06/2022 Side Pull: Double Arm   On back, knees bent, feet flat. Arms perpendicular to body, shoulder level, elbows straight but relaxed. Pull arms out to sides, elbows straight. Resistance band comes across collarbones, hands toward floor. Hold momentarily. Slowly return to starting position. Repeat _5-10__ times. Band color _yellow____    Access Code: [email or web address] URL: [email or web address] Date: 08/06/2022    Education details: discussed POC/activities that will be performedAccess Code: [email or web address] URL: [email or web address] Date: 07/22/2022 Prepared by: Cheral Almas  Exercises - Supine Hamstring Stretch with Strap  - 1 x daily - 7 x weekly - 3 sets - 10 reps, Meeks decompression x 5 Person educated: Patient Education method: Explanation Education comprehension: verbalized understanding  HOME EXERCISE PROGRAM:  Supine Hamstring Stretch with Strap  - 1 x daily - 7 x weekly - 3 sets - 10 reps, Meeks decompression x 5, supine horizontal abd with TrA activation and red band x 10, standing scapular retraction red x 10 ASSESSMENT:  CLINICAL IMPRESSION: Pt was fatigued after last visit and had sore abdominals but notes it felt really good to exercise. We progressed supine exercises today and incorporated some balance activities. She requires only occasional  VC's for form with exercises. She had some challenges with balance but she felt good after exercising .  OBJECTIVE IMPAIRMENTS: Abnormal gait, decreased activity tolerance, decreased balance, decreased ROM, decreased strength, increased edema, impaired sensation, improper body mechanics, and postural dysfunction.   ACTIVITY LIMITATIONS: lifting, bending, standing, sleeping, stairs, bed mobility, and locomotion level  PARTICIPATION LIMITATIONS: cleaning, laundry, shopping, and occupation  PERSONAL FACTORS: 3+ comorbidities: bilateral Breast Cancer  s/p Radiation with new bone Metastasis and palliative radiation for pain  are also affecting patient's functional outcome.   REHAB POTENTIAL: Good  CLINICAL DECISION MAKING: Evolving/moderate complexity  EVALUATION COMPLEXITY: Low  GOALS: Goals reviewed with patient? Yes  SHORT TERM GOALS: Target date: 07/22/2022  Pt will be independent in a HEP for gentle lumbar/core neutral spine activities and LE flexibility Baseline: Goal status:MET 07/22/2022  2.  Pt will be able to get in and out of bed and brush teeth with good body mechanics Baseline: no knowledge Goal status:MET for in/out of bed, nearly Met for hip hinge  LONG TERM GOALS: Target date: 2/29/2023  Pt will be approved for Flexi touch Sequential compression pump for management of bilateral breast edema Baseline:  Goal status:MET but not set up yet. Pt is moving 2.  Pt will have AROM for bilateral DF improved to atleast 5 degrees for improved gait pattern Baseline:  Goal status:In Progress  3.  Pt will be educated in proper body mechanics for home activities to prevent injury Baseline: no knowledge Goal status:In progress  4.  Pt will ambulate with improved heel strike /toe off and decreased sideways translation for improved energy conservation Baseline:  Goal status:In progress 5. Pt will be independent in and tolerant of higher level core strength/stability to decrease risk of injury  Baseline:no Knowledge  NEW  PLAN:  PT FREQUENCY: 2x/week  PT DURATION:6 weeks  PLANNED INTERVENTIONS: Therapeutic exercises, Therapeutic activity, Neuromuscular re-education, Balance training, Patient/Family education, Self Care, Orthotic/Fit training, Aquatic Therapy, Manual lymph drainage, Manual therapy, and Re-evaluation  PLAN FOR NEXT SESSION: update HEP,check on compression sleeves,Flexi set up in clinic?hip hinge,  cont gastroc and HS stretches,,test balance, gentle core strength, STM prn for chest/muscular tightness, shoulder  ROM prn, update HEP with supine stab exs, add standing theraband exs   Claris Pong, PT 08/06/2022, 12:14 PM

## 2022-08-06 NOTE — Patient Instructions (Addendum)
Side Pull: Double Arm   On back, knees bent, feet flat. Arms perpendicular to body, shoulder level, elbows straight but relaxed. Pull arms out to sides, elbows straight. Resistance band comes across collarbones, hands toward floor. Hold momentarily. Slowly return to starting position. Repeat _5-10__ times. Band color _yellow____    Access Code: [email or web address] URL: [email or web address] Date: 08/06/2022 Prepared by: Cheral Almas  Exercises - Scapular Retraction with Resistance  - 1 x daily - 7 x weekly - 3 sets - 10 reps

## 2022-08-10 ENCOUNTER — Ambulatory Visit: Payer: Medicaid Other

## 2022-08-10 ENCOUNTER — Other Ambulatory Visit: Payer: Self-pay

## 2022-08-10 ENCOUNTER — Encounter: Payer: Self-pay | Admitting: Nurse Practitioner

## 2022-08-10 ENCOUNTER — Inpatient Hospital Stay: Payer: Medicaid Other | Attending: Adult Health | Admitting: Nurse Practitioner

## 2022-08-10 ENCOUNTER — Other Ambulatory Visit (HOSPITAL_COMMUNITY): Payer: Self-pay

## 2022-08-10 DIAGNOSIS — Z515 Encounter for palliative care: Secondary | ICD-10-CM

## 2022-08-10 DIAGNOSIS — C50411 Malignant neoplasm of upper-outer quadrant of right female breast: Secondary | ICD-10-CM | POA: Diagnosis not present

## 2022-08-10 DIAGNOSIS — G893 Neoplasm related pain (acute) (chronic): Secondary | ICD-10-CM | POA: Diagnosis not present

## 2022-08-10 DIAGNOSIS — C7951 Secondary malignant neoplasm of bone: Secondary | ICD-10-CM | POA: Diagnosis not present

## 2022-08-10 DIAGNOSIS — Z170 Estrogen receptor positive status [ER+]: Secondary | ICD-10-CM

## 2022-08-10 DIAGNOSIS — I890 Lymphedema, not elsewhere classified: Secondary | ICD-10-CM

## 2022-08-10 MED ORDER — OXYCODONE-ACETAMINOPHEN 7.5-325 MG PO TABS
1.0000 | ORAL_TABLET | Freq: Four times a day (QID) | ORAL | 0 refills | Status: DC | PRN
  Filled 2022-08-10: qty 102, 13d supply, fill #0

## 2022-08-10 NOTE — Progress Notes (Signed)
[facility]  [phone] [phone]) [phone]   Name: Chelsea Hansen Date: 08/10/2022 MRN: [id number]  DOB: 14-Oct-1970  Patient Care Team: [name], NP as PCP - General (Internal Medicine) [name], DO as PCP - Cardiology (Cardiology) [name], MD as Consulting Physician (Radiation Oncology) [name], MD as Consulting Physician (General Surgery) [name], MD as Consulting Physician (Hematology and Oncology) [name], Carlena Sax, NP as Nurse Practitioner (Nurse Practitioner)   INTERVAL HISTORY: Chelsea Hansen is a 52 y.o. 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  MRI of L-spine (12/23) showed L3 pathologic fracture and significant lesion along S1 with sclerotic changes.  Palliative ask to see for symptom management and goals of care.   SOCIAL HISTORY:     reports that she has never smoked. She has never used smokeless tobacco. She reports that she does not currently use alcohol after a past usage of about 7.0 standard drinks of alcohol per week. She reports that she does not use drugs.  ADVANCE DIRECTIVES:    CODE STATUS:   PAST MEDICAL HISTORY: Past Medical History:  Diagnosis Date   Allergy    seasonal allergies   Anxiety    on meds   Asthma    uses inhaler   Breast cancer ([place]) 2012   RIGHT lumpectomy   Cancer ([facility]) 2022   RIGHT breast lump-dx 2022   Depression    on meds   DVT (deep venous thrombosis) ([place]) 2010   after hysterectomy   Family history of uterine cancer    GERD (gastroesophageal reflux disease)    with certain foods/OTC PRN meds   Headache(784.0)     History of radiation therapy    Bilateral breast- 09/03/21-11/02/21- Dr. [name]   Hyperlipidemia    on meds   Hypertension    on meds   SVT (supraventricular tachycardia)     ALLERGIES:  has No Known Allergies.  MEDICATIONS:  Current Outpatient Medications  Medication Sig Dispense Refill   abemaciclib (VERZENIO) 50 MG tablet Take 1 tablet (50 mg total) by mouth 2 (two) times daily. 60 tablet 3   albuterol (VENTOLIN HFA) 108 (90 Base) MCG/ACT inhaler Inhale 2 puffs by mouth into the lungs every 6 hours as needed for wheezing or shortness of breath. 18 g 2   anastrozole (ARIMIDEX) 1 MG tablet Take 1 tablet (1 mg total) by mouth daily. 90 tablet 3   calcium-vitamin D (OSCAL WITH D) 500-5 MG-MCG tablet Take 1 tablet by mouth daily with supper. 90 tablet 2   escitalopram (LEXAPRO) 20 MG tablet Take 1 tablet (20 mg total) by mouth daily. 90 tablet 1   hydrochlorothiazide (HYDRODIURIL) 25 MG tablet Take 1 tablet (25 mg total) by mouth daily. HOLD until follow up with your primary doctor 30 tablet 0   montelukast (SINGULAIR) 10 MG tablet Take 1 tablet (10 mg total) by mouth at bedtime. 90 tablet 3   ondansetron (ZOFRAN) 4 MG tablet Take 1 tablet (4 mg total) by mouth every 8 (eight) hours. 90 tablet 11   oxyCODONE ER 9 MG C12A Take 9 mg by mouth 2 (two) times daily. 30 capsule 0   oxyCODONE-acetaminophen (PERCOCET) 7.5-325 MG tablet Take 1 - 2 tablets  by mouth every 6 (six) hours as needed for severe pain. 102 tablet 0   pantoprazole (PROTONIX) 40 MG tablet Take 1 tablet (40 mg total) by mouth daily. 30 tablet 3   polyethylene glycol (MIRALAX / GLYCOLAX) 17 g packet Take 17 g by mouth 2 (two) times daily. 60 each 3   prochlorperazine (COMPAZINE) 10 MG tablet Take 1 tablet (10 mg total) by mouth every 6 (six) hours as needed for nausea or vomiting. 30 tablet 0   propranolol (INDERAL) 10 MG tablet Take 1 tablet (10 mg total) by mouth 2 (two) times daily. 180 tablet 3   senna (SENOKOT) 8.6 MG  TABS tablet Take 2 tablets (17.2 mg total) by mouth at bedtime. 60 tablet 3   No current facility-administered medications for this visit.   Facility-Administered Medications Ordered in Other Visits  Medication Dose Route Frequency Provider Last Rate Last Admin   0.9 %  sodium chloride infusion   Intravenous Continuous Causey, Charlestine Massed, NP   Stopped at 06/24/22 1309    VITAL SIGNS: There were no vitals taken for this visit. There were no vitals filed for this visit.   Estimated body mass index is 28.87 kg/m as calculated from the following:   Height as of 07/29/22: 5' 11"$  (1.803 m).   Weight as of 07/29/22: 207 lb (93.9 kg).   PERFORMANCE STATUS (ECOG) : 1 - Symptomatic but completely ambulatory  Assessment NAD RRR Normal breathing pattern  AAO x3  IMPRESSION:  [name] presents to clinic today for symptom management follow-up.  No acute distress noted.  She recently went to [place] with some family.  Initially had difficulty with pain due to extended plane ride however was able to enjoy majority of her trip with her medications.  If actively working.  States she has been able to work a little bit more over the past couple weeks.  Neoplasm related pain [name] reports her pain is somewhat controlled.  Some days are better than others.  She does feel that her pain has increased over the past week.  Her pain is mostly in her back and shoulder area.  We discussed at length her current regimen.  She is taking Xtampza 9 mg every 12 hours and Percocet every 6 hours as needed for breakthrough pain.  We discussed her prescriptions more specifically her Xtampza.  She is past due for a refill.  I contacted pharmacy and patient's recent prescription is still on file.  Education provided on effective pain management and the need to take Xtampza in addition to her Percocet for better control.  No adjustments to be made today.  Patient is aware we will continue to closely monitor and gain  better understanding of pain based on the following regimen.  Percocet refills have been appropriate.  Patient has several pills left and is past due for refill which identifies responsible administration.  She knows to contact our office sooner if needed.  No changes will be made today.   Pain contract on file.  Nausea/Vomiting   Improved with antiemetics.  Constipation Improved with bowel regimen.  PLAN: Continue Xtampza 9 mg every 12 hours.  Patient to pick up prescription today and take as directed.  Take with food.  Continue Percocet every 6 hours as needed for breakthrough pain MiraLAX twice daily for bowel regimen Senna daily Protonix 40 mg daily  Patient reports pain is well-controlled. I will plan to see patient back in 3-4 weeks in collaboration with her other oncology  appointments. Patient knows to contact our office sooner if needed.   Patient expressed understanding and was in agreement with this plan. She also understands that She can call the clinic at any time with any questions, concerns, or complaints.    Any controlled substances utilized were prescribed in the context of palliative care. PDMP has been reviewed.    Time Total: 45 min  Visit consisted of counseling and education dealing with the complex and emotionally intense issues of symptom management and palliative care in the setting of serious and potentially life-threatening illness.Greater than 50%  of this time was spent counseling and coordinating care related to the above assessment and plan.  Alda Lea, AGPCNP-BC  Palliative Medicine Team/[facility]

## 2022-08-10 NOTE — Therapy (Signed)
OUTPATIENT PHYSICAL THERAPY ONCOLOGY EVALUATION  Patient Name: Chelsea Hansen MRN: [id number] DOB:03/14/1971, 52 y.o., female Today's Date: 08/10/2022  END OF SESSION:  PT End of Session - 08/10/22 1407     Visit Number 7    Number of Visits 16    Date for PT Re-Evaluation 09/09/22    Authorization Type Medicaid    Authorization Time Period 4 weeks    Authorization - Visit Number 2    Authorization - Number of Visits 12    Progress Note Due on Visit 11    PT Start Time 1407    PT Stop Time 1451    PT Time Calculation (min) 44 min    Activity Tolerance Patient tolerated treatment well    Behavior During Therapy WFL for tasks assessed/performed             Past Medical History:  Diagnosis Date   Allergy    seasonal allergies   Anxiety    on meds   Asthma    uses inhaler   Breast cancer ([place]) 2012   RIGHT lumpectomy   Cancer ([place]) 2022   RIGHT breast lump-dx 2022   Depression    on meds   DVT (deep venous thrombosis) ([place]) 2010   after hysterectomy   Family history of uterine cancer    GERD (gastroesophageal reflux disease)    with certain foods/OTC PRN meds   Headache(784.0)    History of radiation therapy    Bilateral breast- 09/03/21-11/02/21- Dr. [name]   Hyperlipidemia    on meds   Hypertension    on meds   SVT (supraventricular tachycardia)    Past Surgical History:  Procedure Laterality Date   ABDOMINAL HYSTERECTOMY     AXILLARY SENTINEL NODE BIOPSY Left 07/09/2021   Procedure: LEFT AXILLARY SENTINEL NODE BIOPSY;  Surgeon: [name], MD;  Location: [facility];  Service: General;  Laterality: Left;   BIOPSY  06/29/2022   Procedure: BIOPSY;  Surgeon: Doran Stabler, MD;  Location: [facility];  Service: Gastroenterology;;   BREAST EXCISIONAL BIOPSY Right 09/16/2009   BREAST LUMPECTOMY WITH RADIOACTIVE SEED LOCALIZATION Bilateral 07/09/2021   Procedure: BILATERAL BREAST LUMPECTOMY WITH RADIOACTIVE SEED LOCALIZATION;   Surgeon: [name], MD;  Location: [facility];  Service: General;  Laterality: Bilateral;   BREAST SURGERY     lumpectomy   ESOPHAGOGASTRODUODENOSCOPY (EGD) WITH PROPOFOL N/A 06/29/2022   Procedure: ESOPHAGOGASTRODUODENOSCOPY (EGD) WITH PROPOFOL;  Surgeon: Doran Stabler, MD;  Location: [facility];  Service: Gastroenterology;  Laterality: N/A;   PORT-A-CATH REMOVAL Left 07/09/2021   Procedure: REMOVAL PORT-A-CATH;  Surgeon: [name], MD;  Location: [facility];  Service: General;  Laterality: Left;   PORTACATH PLACEMENT Left 01/26/2021   Procedure: INSERTION PORT-A-CATH;  Surgeon: [name], MD;  Location: [facility];  Service: General;  Laterality: Left;   RADIOACTIVE SEED GUIDED AXILLARY SENTINEL LYMPH NODE Right 07/09/2021   Procedure: RADIOACTIVE SEED GUIDED RIGHT AXILLARY SENTINEL LYMPH NODE DISSECTION;  Surgeon: [name], MD;  Location: [facility];  Service: General;  Laterality: Right;   TUBAL LIGATION     Patient Active Problem List   Diagnosis Date Noted   Chronic constipation 06/30/2022   Nausea and vomiting in adult 06/29/2022   Hematemesis 06/28/2022   Metastasis to bone ([place]) 05/20/2022   PSVT (paroxysmal supraventricular tachycardia) 07/08/2021   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   GERD (gastroesophageal reflux disease) 05/27/2021   Hyperlipidemia 05/27/2021  Hypertension 05/27/2021   Pain management contract signed 05/04/2021   Chronic pain syndrome (breast cancer) 05/04/2021   Palpitations 03/25/2021   Sinus bradycardia 03/25/2021   Daytime somnolence 03/25/2021   Snoring 03/25/2021   Obesity (BMI 30-39.9) 03/25/2021   Port-A-Cath in place 01/27/2021   Genetic testing 12/24/2020   Family history of uterine cancer 12/17/2020   Malignant neoplasm of upper-outer quadrant of right breast in female, estrogen receptor positive ([place]) 12/12/2020   Malignant neoplasm of upper-outer  quadrant of left breast in female, estrogen receptor positive ([place]) 12/12/2020   Hypokalemia 01/02/2012   Asthma 01/01/2012   Bradycardia 01/01/2012   Syncope 12/31/2011   Mediastinal adenopathy 12/31/2011   Anemia 12/31/2011   Chest pain 12/31/2011    PCP: Juluis Mire  REFERRING PROVIDER: [name]  REFERRING DIAG: Malignant neoplasm of upper-outer quadrant of right breast in female, estrogen receptor positive ([place])  Malignant neoplasm of upper-outer quadrant of left breast in female, estrogen receptor positive ([place])  Lymphedema, not elsewhere classified  Metastasis to bone ([place])  Neoplasm related pain (acute) (chronic)  THERAPY DIAG:  Malignant neoplasm of upper-outer quadrant of right breast in female, estrogen receptor positive ([place])  Malignant neoplasm of upper-outer quadrant of left breast in female, estrogen receptor positive ([place])  Lymphedema, not elsewhere classified  Metastasis to bone ([place])  Neoplasm related pain (acute) (chronic)  ONSET DATE: 01/2021  Rationale for Evaluation and Treatment: Rehabilitation  SUBJECTIVE:                                                                                                                                                                                           SUBJECTIVE STATEMENT:   I did OK after last visit, but my working 3 nights in a row made me very sore and painful especially because we are short staffed at work.  PERTINENT HISTORY:  Neoadjuvant chemotherapy with Doxorubicin and Cyclophosphamide given x 4 beginning 01/27/2021 and completing on 03/10/2021 followed by weekly paclitaxel x 12 beginning 05/2021 07/09/2021: Right lumpectomy: Grade 2 invasive lobular cancer, 2.5 cm, LCIS, margins negative, LCIS focally at anterior margin, 1/1 lymph node positive, ER 95%, PR 100%, HER2 negative, Ki-67 40% Left lumpectomy: High-grade DCIS: 0.7 cm, margins negative, 0/1 lymph node negative ER 95%, PR 100%. Pt now   STAGE IV Breast Cancer with bone mets throughout thoracic and lumbar regions and is pending radiation to her lumbar spine next week.    PAIN:  Are you having pain? Yes NPRS scale: 8/10 today legs, back, Pain location: LB Pain orientation: Bilateral  PAIN TYPE: aching, sharp,  Pain description:intermittent Aggravating factors: standing, sleeping, walking, stairs, housework  Relieving factors: Pain meds,changing positions,  PRECAUTIONS: BONE METS currently s/p palliative radiation to spine, bilateral breast lymphedema, bilateral UE lymphedema risk  WEIGHT BEARING RESTRICTIONS: No  FALLS:  Has patient fallen in last 6 months? No  LIVING ENVIRONMENT: Lives with: currently living alone Lives [id number] presently  Has following equipment : Single point cane, Walker - 4 wheeled, shower chair, and bed side commode  OCCUPATION: mgr at [employer]  LEISURE: shop, dominoes  HAND DOMINANCE: right   PRIOR LEVEL OF FUNCTION: Independent  PATIENT GOALS:   Get back to life, keep breast swelling down, learn exercises to strengthen my back/core,keep shoulder ROM on the right track   OBJECTIVE:  COGNITION: Overall cognitive status: Within functional limits for tasks assessed   PALPATION:   OBSERVATIONS / OTHER ASSESSMENTS: bilateral breasts with enlarged pores but improved since prior treatment session  SENSATION: Light touch: Appears intact   POSTURE: forward head, rounded shoulders  UPPER EXTREMITY AROM/PROM:  A/PROM RIGHT   eval   Shoulder extension 55  Shoulder flexion 157  Shoulder abduction 174  Shoulder internal rotation 65  Shoulder external rotation 98    (Blank rows = not tested)  A/PROM LEFT   eval  Shoulder extension 56  Shoulder flexion 156  Shoulder abduction 172  Shoulder internal rotation 65  Shoulder external rotation 95    (Blank rows = not tested)  CERVICAL AROM: All within functional limits:    LUMBAR ROM: Lumbar ROM without increasing  pain; Bilateral SB decreased 25%, FB decreased 90%, BB decreased 90% all with pain  ANKLE  AROM DF : right -12,  LEFT  -8,   , Jul 22, 2022 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  HS Length: 30 deg Bilaterally, 07/22/2022  Left   35    Degrees           Right 45 Degrees  UPPER EXTREMITY STRENGTH: WFL  LOWER EXTREMITY MMT: WFL     LYMPHEDEMA ASSESSMENTS:   SURGERY TYPE/DATE: 07/09/2021 BILATERAL BREAST LUMPECTOMY WITH RADIOACTIVE SEED LOCALIZATION RADIOACTIVE SEED GUIDED RIGHT AXILLARY TARGETED LYMPH NODE DISSECTION LEFT AXILLARY SENTINEL NODE BIOPSY LEFT AXILLARY LYMPH NODE MAPPING WITH SENTIMAG    NUMBER OF LYMPH NODES REMOVED: Right 1/1, Left 0/1   CHEMOTHERAPY:  neoadjuvant    RADIATION:yes bilateral breast, recently finished palliative radiation for bone METS to spine    HORMONE TREATMENT: YES  INFECTIONS: NO  LYMPHEDEMA ASSESSMENTS:     LANDMARK RIGHT  03/04/2022 Right   10 cm proximal to olecranon process 30.5   Olecranon process 27.1   10 cm proximal to ulnar styloid process 21.2   Just proximal to ulnar styloid process 16.6   Across hand at thumb web space 21.4   At base of 2nd digit 6.8   (Blank rows = not tested)   LANDMARK LEFT  03/04/2022 LEFT  10 cm proximal to olecranon process 30.7   Olecranon process 26.7   10 cm proximal to ulnar styloid process 20.5   Just proximal to ulnar styloid process 16.3   Across hand at thumb web space 21.1   At base of 2nd digit 6.7   (Blank rows = not tested)      GAIT: Distance walked: 20 feet Assistive device utilized: None Level of assistance: Complete Independence Comments: decreased heel strike, toe off, sideways translation with some LOB  QUICK DASH SURVEY: not completed   TODAY'S TREATMENT:  DATE: 08/10/2022  Nu-Step seat 10, UE 11, resistance 3 x 4 min Incline gastroc  stretch in bars 3 x 30 sec In bars: step and hold on ax 2 x 5 forwards and sideways no HH Heel raises x 10 on ax, Marching x 10 on ax Tandem walking on ax beam , sidestepping on ax beam 4 lengths TrA and hip adductor bolster squeeze x 10 TrA with march and alternate UE flexion TrA march with alternate knee touch x10 ea side TrA with hip abduction yellow x 15  Bilateral figure 4 stretch x20 sec Updated HEP 08/06/2022 TrA with marching x 10 TrA with march and alternate UE flexion TrA march with alternate knee touch x10 ea side Bridge 2 x 5 Hip adductor ball squeeze x10 HS stretch with strap x 3 x 30 sec Supine horizontal abduction with TrA x 10 Standing Scapular retraction red with TrA x 10 Marching with TrA 2 x 10 Heel raises x 10 Tandem stance bilaterally x 20 Step and hold on ax forward and sideways x 5 B 08/04/2022 Meeks decompression x 5ea to warm up HS stretch with strap supine 3 x 20 sec B,  right leg 45 degrees, left 36 degrees Bilateral gastroc stretch supine with strap 3 x 30 sec,   Supine TrA with march,2x 5, March with opposite arm raises 2 x 10 March with opposite knee touches 2 x 10 TrA with heel slides x 10 ea TrA with hip adductor purple ball squeeze Supine wand flexion and scaption x 5    07/22/2022 Reviewed in and out of bed and hip hinge practicing hinge at counter to simulate brushing teeth Meeks decompression x 5ea to warm up HS stretch with strap supine 3 x 20 sec B,  right leg 45 degrees, left 36 degrees Bilateral gastroc stretch supine with strap 3 x 20 sec,   Supine TrA with march, x 10, March with opposite arm raises 2 x 10 Assessed DF and HS ROM and checked goals  07/13/2022 Meeks decompression exercises x 5 Tra contraction with alternating arm raises x 10 ea Tra contraction with bilateral shoulder flexion, scaption, horizontal abduction x5 Tra activation and alternate LE marching x 10, ball squeeze B, hip abd bilaterally x 10 Supine gentle figure 4  stretch x 3 bilaterally x 5-10 sec Supine wand flexion and scaption x5   07/06/2022  Instructed in sit to and from supine and practiced x 3 and hip hinge to protect back with activities at home. Initiated Meeks decompression exercises x 5 ea with 2-3 second hold and updated HEP Sitting heel and toe raises x 10 ea Sitting gastroc stretch x 3 with 20 sec hold Standing scapular retraction with yellow band x 10 Added all above to HEP. Advised pt she should never have pain with exercises and to back off if she does,   06/25/2022  Evaluation onl  PATIENT EDUCATION:  08/06/2022 Side Pull: Double Arm   On back, knees bent, feet flat. Arms perpendicular to body, shoulder level, elbows straight but relaxed. Pull arms out to sides, elbows straight. Resistance band comes across collarbones, hands toward floor. Hold momentarily. Slowly return to starting position. Repeat _5-10__ times. Band color _yellow____   EDUCATION  08/10/2022 Access code;[email or web address] Bridge, ppt, ppt with march, hip adductor squeze, hip abduction  Access Code: [email or web address] URL: [email or web address] Date: 08/06/2022  Education details: discussed POC/activities that will be performedAccess Code: [email or web address] URL: [email or web address] Date: 07/22/2022 Prepared by: Cheral Almas  Exercises - Supine Hamstring Stretch with Strap  -  1 x daily - 7 x weekly - 3 sets - 10 reps, Meeks decompression x 5 Person educated: Patient Education method: Explanation Education comprehension: verbalized understanding  HOME EXERCISE PROGRAM:  Supine Hamstring Stretch with Strap  - 1 x daily - 7 x weekly - 3 sets - 10 reps, Meeks decompression x 5, supine horizontal abd with TrA activation and red band x 10, standing scapular retraction red x 10 ASSESSMENT:  CLINICAL IMPRESSION: Pt loved the Nustep and felt loosened up afterwards . She did very well with balance activities and coordination for stability exercises  today. She felt energized after exercising. OBJECTIVE IMPAIRMENTS: Abnormal gait, decreased activity tolerance, decreased balance, decreased ROM, decreased strength, increased edema, impaired sensation, improper body mechanics, and postural dysfunction.   ACTIVITY LIMITATIONS: lifting, bending, standing, sleeping, stairs, bed mobility, and locomotion level  PARTICIPATION LIMITATIONS: cleaning, laundry, shopping, and occupation  PERSONAL FACTORS: 3+ comorbidities: bilateral Breast Cancer s/p Radiation with new bone Metastasis and palliative radiation for pain  are also affecting patient's functional outcome.   REHAB POTENTIAL: Good  CLINICAL DECISION MAKING: Evolving/moderate complexity  EVALUATION COMPLEXITY: Low  GOALS: Goals reviewed with patient? Yes  SHORT TERM GOALS: Target date: 07/22/2022  Pt will be independent in a HEP for gentle lumbar/core neutral spine activities and LE flexibility Baseline: Goal status:MET 07/22/2022  2.  Pt will be able to get in and out of bed and brush teeth with good body mechanics Baseline: no knowledge Goal status:MET for in/out of bed, nearly Met for hip hinge  LONG TERM GOALS: Target date: 2/29/2023  Pt will be approved for Flexi touch Sequential compression pump for management of bilateral breast edema Baseline:  Goal status:MET but not set up yet. Pt is moving 2.  Pt will have AROM for bilateral DF improved to atleast 5 degrees for improved gait pattern Baseline:  Goal status:In Progress  3.  Pt will be educated in proper body mechanics for home activities to prevent injury Baseline: no knowledge Goal status:In progress  4.  Pt will ambulate with improved heel strike /toe off and decreased sideways translation for improved energy conservation Baseline:  Goal status:In progress 5. Pt will be independent in and tolerant of higher level core strength/stability to decrease risk of injury  Baseline:no Knowledge  NEW  PLAN:  PT  FREQUENCY: 2x/week  PT DURATION:6 weeks  PLANNED INTERVENTIONS: Therapeutic exercises, Therapeutic activity, Neuromuscular re-education, Balance training, Patient/Family education, Self Care, Orthotic/Fit training, Aquatic Therapy, Manual lymph drainage, Manual therapy, and Re-evaluation  PLAN FOR NEXT SESSION: update HEP,check on compression sleeves,Flexi set up in clinic?hip hinge,  cont gastroc and HS stretches,,test balance, gentle core strength, STM prn for chest/muscular tightness, shoulder ROM prn, update HEP with supine stab exs, add standing theraband exs   Claris Pong, PT 08/10/2022, 2:54 PM

## 2022-08-11 ENCOUNTER — Other Ambulatory Visit (HOSPITAL_COMMUNITY): Payer: Self-pay

## 2022-08-11 ENCOUNTER — Other Ambulatory Visit: Payer: Self-pay

## 2022-08-11 NOTE — Progress Notes (Incomplete)
  Radiation Oncology         [phone] ________________________________  Patient Name: Chelsea Hansen MRN: [id number] DOB: Nov 03, 1970 Referring Physician: [name] (Profile Not Attached) Date of Service: 07/07/2022 [facility], [place]                                                        End Of Treatment Note  Diagnoses: C79.51-Secondary malignant neoplasm of bone  Cancer Staging: The primary encounter diagnosis was Carcinoma of breast metastatic to bone, unspecified laterality ([place]). Diagnoses of Malignant neoplasm of upper-outer quadrant of right breast in female, estrogen receptor positive ([place]), Malignant neoplasm of upper-outer quadrant of left breast in female, estrogen receptor positive ([place]), and Metastasis to bone [facility]) were also pertinent to this visit.   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 On the date of her final treatment, the patient endorsed generalized pain rated at a 7/10 (slightly improved with treatment), fatigue, and continued episodes of nausea and vomiting (somewhat improved). She denied any other symptoms or concerns.   Plan: The patient will follow-up with radiation oncology in one month .  ________________________________________________ -----------------------------------  Blair Promise, PhD, MD  This document serves as a record of services  personally performed by [name], MD. It was created on his behalf by Roney Mans, a trained medical scribe. The creation of this record is based on the scribe's personal observations and the provider's statements to them. This document has been checked and approved by the attending provider.

## 2022-08-12 ENCOUNTER — Ambulatory Visit
Admission: RE | Admit: 2022-08-12 | Discharge: 2022-08-12 | Disposition: A | Discharge status: Home / Self Care | Payer: Medicaid Other | Source: Ambulatory Visit | Attending: Radiation Oncology | Admitting: Radiation Oncology

## 2022-08-12 ENCOUNTER — Other Ambulatory Visit: Payer: Self-pay

## 2022-08-12 ENCOUNTER — Encounter: Payer: Medicaid Other | Admitting: Student in an Organized Health Care Education/Training Program

## 2022-08-12 ENCOUNTER — Ambulatory Visit: Payer: Medicaid Other

## 2022-08-12 ENCOUNTER — Other Ambulatory Visit (HOSPITAL_COMMUNITY): Payer: Self-pay

## 2022-08-12 ENCOUNTER — Encounter: Payer: Self-pay | Admitting: Radiation Oncology

## 2022-08-12 VITALS — BP 136/84 | HR 66 | Temp 97.6°F | Resp 20 | Ht 71.0 in | Wt 207.8 lb

## 2022-08-12 DIAGNOSIS — Z79899 Other long term (current) drug therapy: Secondary | ICD-10-CM | POA: Insufficient documentation

## 2022-08-12 DIAGNOSIS — K5730 Diverticulosis of large intestine without perforation or abscess without bleeding: Secondary | ICD-10-CM | POA: Diagnosis not present

## 2022-08-12 DIAGNOSIS — Z79811 Long term (current) use of aromatase inhibitors: Secondary | ICD-10-CM | POA: Insufficient documentation

## 2022-08-12 DIAGNOSIS — Z923 Personal history of irradiation: Secondary | ICD-10-CM | POA: Diagnosis not present

## 2022-08-12 DIAGNOSIS — M5136 Other intervertebral disc degeneration, lumbar region: Secondary | ICD-10-CM | POA: Diagnosis not present

## 2022-08-12 DIAGNOSIS — K449 Diaphragmatic hernia without obstruction or gangrene: Secondary | ICD-10-CM | POA: Diagnosis not present

## 2022-08-12 DIAGNOSIS — C7951 Secondary malignant neoplasm of bone: Secondary | ICD-10-CM | POA: Diagnosis not present

## 2022-08-12 DIAGNOSIS — C50412 Malignant neoplasm of upper-outer quadrant of left female breast: Secondary | ICD-10-CM | POA: Diagnosis not present

## 2022-08-12 DIAGNOSIS — Z170 Estrogen receptor positive status [ER+]: Secondary | ICD-10-CM | POA: Insufficient documentation

## 2022-08-12 DIAGNOSIS — I517 Cardiomegaly: Secondary | ICD-10-CM | POA: Diagnosis not present

## 2022-08-12 DIAGNOSIS — I890 Lymphedema, not elsewhere classified: Secondary | ICD-10-CM

## 2022-08-12 DIAGNOSIS — C50411 Malignant neoplasm of upper-outer quadrant of right female breast: Secondary | ICD-10-CM | POA: Insufficient documentation

## 2022-08-12 DIAGNOSIS — G893 Neoplasm related pain (acute) (chronic): Secondary | ICD-10-CM

## 2022-08-12 NOTE — Therapy (Signed)
OUTPATIENT PHYSICAL THERAPY ONCOLOGY EVALUATION  Patient Name: Chelsea Hansen MRN: [id number] DOB:1971/03/18, 52 y.o., female Today's Date: 08/12/2022  END OF SESSION:  PT End of Session - 08/12/22 0802     Visit Number 8    Number of Visits 16    Date for PT Re-Evaluation 09/09/22    Authorization Type Medicaid    Authorization Time Period 6    Authorization - Visit Number 3    Authorization - Number of Visits 12    PT Start Time 0803    PT Stop Time 0849    PT Time Calculation (min) 46 min    Activity Tolerance Patient tolerated treatment well    Behavior During Therapy [facility] for tasks assessed/performed             Past Medical History:  Diagnosis Date   Allergy    seasonal allergies   Anxiety    on meds   Asthma    uses inhaler   Breast cancer ([place]) 2012   RIGHT lumpectomy   Cancer ([place]) 2022   RIGHT breast lump-dx 2022   Depression    on meds   DVT (deep venous thrombosis) ([place]) 2010   after hysterectomy   Family history of uterine cancer    GERD (gastroesophageal reflux disease)    with certain foods/OTC PRN meds   Headache(784.0)    History of radiation therapy    Bilateral breast- 09/03/21-11/02/21- Dr. [name]   Hyperlipidemia    on meds   Hypertension    on meds   SVT (supraventricular tachycardia)    Past Surgical History:  Procedure Laterality Date   ABDOMINAL HYSTERECTOMY     AXILLARY SENTINEL NODE BIOPSY Left 07/09/2021   Procedure: LEFT AXILLARY SENTINEL NODE BIOPSY;  Surgeon: [name], MD;  Location: [place];  Service: General;  Laterality: Left;   BIOPSY  06/29/2022   Procedure: BIOPSY;  Surgeon: Doran Stabler, MD;  Location: [facility];  Service: Gastroenterology;;   BREAST EXCISIONAL BIOPSY Right 09/16/2009   BREAST LUMPECTOMY WITH RADIOACTIVE SEED LOCALIZATION Bilateral 07/09/2021   Procedure: BILATERAL BREAST LUMPECTOMY WITH RADIOACTIVE SEED LOCALIZATION;  Surgeon: [name], MD;  Location:  [place];  Service: General;  Laterality: Bilateral;   BREAST SURGERY     lumpectomy   ESOPHAGOGASTRODUODENOSCOPY (EGD) WITH PROPOFOL N/A 06/29/2022   Procedure: ESOPHAGOGASTRODUODENOSCOPY (EGD) WITH PROPOFOL;  Surgeon: Doran Stabler, MD;  Location: [facility];  Service: Gastroenterology;  Laterality: N/A;   PORT-A-CATH REMOVAL Left 07/09/2021   Procedure: REMOVAL PORT-A-CATH;  Surgeon: [name], MD;  Location: [place];  Service: General;  Laterality: Left;   PORTACATH PLACEMENT Left 01/26/2021   Procedure: INSERTION PORT-A-CATH;  Surgeon: [name], MD;  Location: [facility];  Service: General;  Laterality: Left;   RADIOACTIVE SEED GUIDED AXILLARY SENTINEL LYMPH NODE Right 07/09/2021   Procedure: RADIOACTIVE SEED GUIDED RIGHT AXILLARY SENTINEL LYMPH NODE DISSECTION;  Surgeon: [name], MD;  Location: [place];  Service: General;  Laterality: Right;   TUBAL LIGATION     Patient Active Problem List   Diagnosis Date Noted   Chronic constipation 06/30/2022   Nausea and vomiting in adult 06/29/2022   Hematemesis 06/28/2022   Metastasis to bone ([place]) 05/20/2022   PSVT (paroxysmal supraventricular tachycardia) 07/08/2021   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   GERD (gastroesophageal reflux disease) 05/27/2021   Hyperlipidemia 05/27/2021   Hypertension 05/27/2021   Pain management contract signed  05/04/2021   Chronic pain syndrome (breast cancer) 05/04/2021   Palpitations 03/25/2021   Sinus bradycardia 03/25/2021   Daytime somnolence 03/25/2021   Snoring 03/25/2021   Obesity (BMI 30-39.9) 03/25/2021   Port-A-Cath in place 01/27/2021   Genetic testing 12/24/2020   Family history of uterine cancer 12/17/2020   Malignant neoplasm of upper-outer quadrant of right breast in female, estrogen receptor positive ([place]) 12/12/2020   Malignant neoplasm of upper-outer quadrant of left breast in female, estrogen  receptor positive ([place]) 12/12/2020   Hypokalemia 01/02/2012   Asthma 01/01/2012   Bradycardia 01/01/2012   Syncope 12/31/2011   Mediastinal adenopathy 12/31/2011   Anemia 12/31/2011   Chest pain 12/31/2011    PCP: Juluis Mire  REFERRING PROVIDER: [name]  REFERRING DIAG: Malignant neoplasm of upper-outer quadrant of right breast in female, estrogen receptor positive ([place])  Malignant neoplasm of upper-outer quadrant of left breast in female, estrogen receptor positive ([place])  Lymphedema, not elsewhere classified  Metastasis to bone ([place])  Neoplasm related pain (acute) (chronic)  THERAPY DIAG:  Malignant neoplasm of upper-outer quadrant of right breast in female, estrogen receptor positive ([place])  Malignant neoplasm of upper-outer quadrant of left breast in female, estrogen receptor positive ([place])  Lymphedema, not elsewhere classified  Metastasis to bone ([place])  Neoplasm related pain (acute) (chronic)  ONSET DATE: 01/2021  Rationale for Evaluation and Treatment: Rehabilitation  SUBJECTIVE:                                                                                                                                                                                           SUBJECTIVE STATEMENT:   I felt better and more energized after last visit. My calfs were a little sore.  PERTINENT HISTORY:  Neoadjuvant chemotherapy with Doxorubicin and Cyclophosphamide given x 4 beginning 01/27/2021 and completing on 03/10/2021 followed by weekly paclitaxel x 12 beginning 05/2021 07/09/2021: Right lumpectomy: Grade 2 invasive lobular cancer, 2.5 cm, LCIS, margins negative, LCIS focally at anterior margin, 1/1 lymph node positive, ER 95%, PR 100%, HER2 negative, Ki-67 40% Left lumpectomy: High-grade DCIS: 0.7 cm, margins negative, 0/1 lymph node negative ER 95%, PR 100%. Pt now  STAGE IV Breast Cancer with bone mets throughout thoracic and lumbar regions and is pending radiation to  her lumbar spine next week.    PAIN:  Are you having pain? Yes NPRS scale: 5/10 today legs, back, Pain location: LB Pain orientation: Bilateral  PAIN TYPE: aching, sharp,  Pain description:intermittent Aggravating factors: standing, sleeping, walking, stairs, housework Relieving factors: Pain meds,changing positions,  PRECAUTIONS: BONE METS currently s/p palliative radiation to spine, bilateral breast lymphedema, bilateral UE lymphedema  risk  WEIGHT BEARING RESTRICTIONS: No  FALLS:  Has patient fallen in last 6 months? No  LIVING ENVIRONMENT: Lives with: currently living alone Lives [id number] presently  Has following equipment : Single point cane, Walker - 4 wheeled, shower chair, and bed side commode  OCCUPATION: mgr at [employer]  LEISURE: shop, dominoes  HAND DOMINANCE: right   PRIOR LEVEL OF FUNCTION: Independent  PATIENT GOALS:   Get back to life, keep breast swelling down, learn exercises to strengthen my back/core,keep shoulder ROM on the right track   OBJECTIVE:  COGNITION: Overall cognitive status: Within functional limits for tasks assessed   PALPATION:   OBSERVATIONS / OTHER ASSESSMENTS: bilateral breasts with enlarged pores but improved since prior treatment session  SENSATION: Light touch: Appears intact   POSTURE: forward head, rounded shoulders  UPPER EXTREMITY AROM/PROM:  A/PROM RIGHT   eval   Shoulder extension 55  Shoulder flexion 157  Shoulder abduction 174  Shoulder internal rotation 65  Shoulder external rotation 98    (Blank rows = not tested)  A/PROM LEFT   eval  Shoulder extension 56  Shoulder flexion 156  Shoulder abduction 172  Shoulder internal rotation 65  Shoulder external rotation 95    (Blank rows = not tested)  CERVICAL AROM: All within functional limits:    LUMBAR ROM: Lumbar ROM without increasing pain; Bilateral SB decreased 25%, FB decreased 90%, BB decreased 90% all with pain  ANKLE  AROM DF : right -12,   LEFT  -8,   , Jul 22, 2022 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  HS Length: 30 deg Bilaterally, 07/22/2022  Left   35    Degrees           Right 45 Degrees  UPPER EXTREMITY STRENGTH: WFL  LOWER EXTREMITY MMT: WFL     LYMPHEDEMA ASSESSMENTS:   SURGERY TYPE/DATE: 07/09/2021 BILATERAL BREAST LUMPECTOMY WITH RADIOACTIVE SEED LOCALIZATION RADIOACTIVE SEED GUIDED RIGHT AXILLARY TARGETED LYMPH NODE DISSECTION LEFT AXILLARY SENTINEL NODE BIOPSY LEFT AXILLARY LYMPH NODE MAPPING WITH SENTIMAG    NUMBER OF LYMPH NODES REMOVED: Right 1/1, Left 0/1   CHEMOTHERAPY:  neoadjuvant    RADIATION:yes bilateral breast, recently finished palliative radiation for bone METS to spine    HORMONE TREATMENT: YES  INFECTIONS: NO  LYMPHEDEMA ASSESSMENTS:     LANDMARK RIGHT  03/04/2022 Right   10 cm proximal to olecranon process 30.5   Olecranon process 27.1   10 cm proximal to ulnar styloid process 21.2   Just proximal to ulnar styloid process 16.6   Across hand at thumb web space 21.4   At base of 2nd digit 6.8   (Blank rows = not tested)   LANDMARK LEFT  03/04/2022 LEFT  10 cm proximal to olecranon process 30.7   Olecranon process 26.7   10 cm proximal to ulnar styloid process 20.5   Just proximal to ulnar styloid process 16.3   Across hand at thumb web space 21.1   At base of 2nd digit 6.7   (Blank rows = not tested)      GAIT: Distance walked: 20 feet Assistive device utilized: None Level of assistance: Complete Independence Comments: decreased heel strike, toe off, sideways translation with some LOB  QUICK DASH SURVEY: not completed   TODAY'S TREATMENT:   08/12/2022 Nu-Step seat 10, UE 11, resistance 3 x 5 min In bars;march on ax x 10, march with alternate arm raise x 10 Heel raises on ax x 15 no HH Standing feet together 3 x 20 sec  eyes closed Step and hold on ax forward and sideways  with same side hip and shoulder flexion x 10  Incline stretch 3 x 30 sec  On Table: Supine  figure 4 stretch  TrA with hip abduction and yellow band x 10 Alternate knee touches with opposite hand with TrA x 10 ea                                                                                                                                  DATE: 08/10/2022 Nu-Step seat 10, UE 11, resistance 3 x 4 min Incline gastroc stretch in bars 3 x 30 sec In bars: step and hold on ax 2 x 5 forwards and sideways no HH Heel raises x 10 on ax, Marching x 10 on ax Tandem walking on ax beam , sidestepping on ax beam 4 lengths TrA and hip adductor bolster squeeze x 10 TrA with march and alternate UE flexion TrA march with alternate knee touch x10 ea side TrA with hip abduction yellow x 15  Bilateral figure 4 stretch x20 sec Updated HEP 08/06/2022 TrA with marching x 10 TrA with march and alternate UE flexion TrA march with alternate knee touch x10 ea side Bridge 2 x 5 Hip adductor ball squeeze x10 HS stretch with strap x 3 x 30 sec Supine horizontal abduction with TrA x 10 Standing Scapular retraction red with TrA x 10 Marching with TrA 2 x 10 Heel raises x 10 Tandem stance bilaterally x 20 Step and hold on ax forward and sideways x 5 B 08/04/2022 Meeks decompression x 5ea to warm up HS stretch with strap supine 3 x 20 sec B,  right leg 45 degrees, left 36 degrees Bilateral gastroc stretch supine with strap 3 x 30 sec,   Supine TrA with march,2x 5, March with opposite arm raises 2 x 10 March with opposite knee touches 2 x 10 TrA with heel slides x 10 ea TrA with hip adductor purple ball squeeze Supine wand flexion and scaption x 5    07/22/2022 Reviewed in and out of bed and hip hinge practicing hinge at counter to simulate brushing teeth Meeks decompression x 5ea to warm up HS stretch with strap supine 3 x 20 sec B,  right leg 45 degrees, left 36 degrees Bilateral gastroc stretch supine with strap 3 x 20 sec,   Supine TrA with march, x 10, March with opposite arm raises 2 x  10 Assessed DF and HS ROM and checked goals  07/13/2022 Meeks decompression exercises x 5 Tra contraction with alternating arm raises x 10 ea Tra contraction with bilateral shoulder flexion, scaption, horizontal abduction x5 Tra activation and alternate LE marching x 10, ball squeeze B, hip abd bilaterally x 10 Supine gentle figure 4 stretch x 3 bilaterally x 5-10 sec Supine wand flexion and scaption x5   07/06/2022  Instructed in sit to and from supine and practiced x 3  and hip hinge to protect back with activities at home. Initiated Meeks decompression exercises x 5 ea with 2-3 second hold and updated HEP Sitting heel and toe raises x 10 ea Sitting gastroc stretch x 3 with 20 sec hold Standing scapular retraction with yellow band x 10 Added all above to HEP. Advised pt she should never have pain with exercises and to back off if she does,   06/25/2022  Evaluation onl  PATIENT EDUCATION:  08/06/2022 Side Pull: Double Arm   On back, knees bent, feet flat. Arms perpendicular to body, shoulder level, elbows straight but relaxed. Pull arms out to sides, elbows straight. Resistance band comes across collarbones, hands toward floor. Hold momentarily. Slowly return to starting position. Repeat _5-10__ times. Band color _yellow____   EDUCATION  08/10/2022 Access code;[email or web address] Bridge, ppt, ppt with march, hip adductor squeze, hip abduction  Access Code: [email or web address] URL: [email or web address] Date: 08/06/2022  Education details: discussed POC/activities that will be performedAccess Code: [email or web address] URL: [email or web address] Date: 07/22/2022 Prepared by: Cheral Almas  Exercises - Supine Hamstring Stretch with Strap  - 1 x daily - 7 x weekly - 3 sets - 10 reps, Meeks decompression x 5 Person educated: Patient Education method: Explanation Education comprehension: verbalized understanding  HOME EXERCISE PROGRAM:  Supine Hamstring Stretch with Strap  - 1 x  daily - 7 x weekly - 3 sets - 10 reps, Meeks decompression x 5, supine horizontal abd with TrA activation and red band x 10, standing scapular retraction red x 10 ASSESSMENT:  CLINICAL IMPRESSION: Pt felt really good and energized after last session. She was a little slow going initially, but was then able to perform some higher level balance exercises using her arm in addition to her legs. She has not been contacted yet about her Flexitouch being set up. She felt great after exercising. OBJECTIVE IMPAIRMENTS: Abnormal gait, decreased activity tolerance, decreased balance, decreased ROM, decreased strength, increased edema, impaired sensation, improper body mechanics, and postural dysfunction.   ACTIVITY LIMITATIONS: lifting, bending, standing, sleeping, stairs, bed mobility, and locomotion level  PARTICIPATION LIMITATIONS: cleaning, laundry, shopping, and occupation  PERSONAL FACTORS: 3+ comorbidities: bilateral Breast Cancer s/p Radiation with new bone Metastasis and palliative radiation for pain  are also affecting patient's functional outcome.   REHAB POTENTIAL: Good  CLINICAL DECISION MAKING: Evolving/moderate complexity  EVALUATION COMPLEXITY: Low  GOALS: Goals reviewed with patient? Yes  SHORT TERM GOALS: Target date: 07/22/2022  Pt will be independent in a HEP for gentle lumbar/core neutral spine activities and LE flexibility Baseline: Goal status:MET 07/22/2022  2.  Pt will be able to get in and out of bed and brush teeth with good body mechanics Baseline: no knowledge Goal status:MET for in/out of bed, nearly Met for hip hinge  LONG TERM GOALS: Target date: 2/29/2023  Pt will be approved for Flexi touch Sequential compression pump for management of bilateral breast edema Baseline:  Goal status:MET but not set up yet. Pt is moving 2.  Pt will have AROM for bilateral DF improved to atleast 5 degrees for improved gait pattern Baseline:  Goal status:In Progress  3.  Pt will  be educated in proper body mechanics for home activities to prevent injury Baseline: no knowledge Goal status:In progress  4.  Pt will ambulate with improved heel strike /toe off and decreased sideways translation for improved energy conservation Baseline:  Goal status:In progress 5. Pt will be independent in and tolerant of higher level core strength/stability to decrease risk of injury  Baseline:no Knowledge  NEW  PLAN:  PT FREQUENCY: 2x/week  PT DURATION:6 weeks  PLANNED INTERVENTIONS: Therapeutic exercises, Therapeutic activity, Neuromuscular re-education, Balance training, Patient/Family education, Self Care, Orthotic/Fit training, Aquatic Therapy, Manual lymph drainage, Manual therapy, and Re-evaluation  PLAN FOR NEXT SESSION: update HEP,check on compression sleeves,Flexi set up in clinic?hip hinge,  cont gastroc and HS stretches,,test balance, gentle core strength, STM prn for chest/muscular tightness, shoulder ROM prn, update HEP with supine stab exs, add standing theraband exs   Claris Pong, PT 08/12/2022, 8:51 AM

## 2022-08-12 NOTE — Progress Notes (Signed)
Radiation Oncology         [phone] ________________________________  Name: Chelsea Hansen MRN: [id number]  Date: 08/12/2022  DOB: 12/14/1970  Follow-Up Visit Note  CC: [name], NP  [name], MD    ICD-10-CM   1. Metastasis to bone [facility])  C79.51       Diagnosis: The primary encounter diagnosis was Carcinoma of breast metastatic to bone, unspecified laterality ([place]). Diagnoses of Malignant neoplasm of upper-outer quadrant of right breast in female, estrogen receptor positive ([place]), Malignant neoplasm of upper-outer quadrant of left breast in female, estrogen receptor positive ([place]), and Metastasis to bone [facility]) were also pertinent to this visit.   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, ER+, /PR+, Her2 not assessed  Interval Since Last Radiation: 1 month and 5 days   Intent: Palliative Radiation Treatment Dates: 06/23/2022 through 07/07/2022 Site Technique Total Dose (Gy) Dose per Fx (Gy) Completed Fx Beam Energies  Lumbar Spine: Spine_LS_Pelv 3D 30/30 3 10/10 15X   Intent: Curative Radiation Treatment Dates: 09/03/2021 through 11/02/2021 Site Technique Total Dose (Gy) Dose per Fx (Gy) Completed Fx Beam Energies  Breast, Right: Breast_R 3D 50.4/50.4 1.8 28/28 10XFFF  Breast, Right: Breast_R_Bst 3D 10/10 2 5/5 6X  Sclav-RT: SCV_R 3D 50.4/50.4 1.8 28/28 6X, 10X  Breast, Left: Breast_L 3D 50.4/50.4 1.8 28/28 10XFFF  Breast, Left: Breast_L_Bst 3D 12/12 2 6/6 6X, 10X    Narrative:  The patient returns today for routine follow-up. The patient tolerated radiation therapy relatively well. On the date of her final treatment, the patient endorsed generalized pain rated at a 7/10  (slightly improved with treatment), fatigue, and continued episodes of nausea and vomiting (somewhat improved). She denied any other symptoms or concerns.               In the midst of radiation therapy, the patient was hospitalized from 06/28/22 through 06/30/22 with intractable nausea/vomiting (presented with daily emesis x 2 weeks). She also had an episode of heavy hematemesis followed by a syncopal event prior to presenting to the ED. Subsequently, she underwent an EGD on 06/29/22 while inpatient which showed mild schatzki ring, a possible forceful vomiting/Mallory-Weiss tear, and congested and erythematous mucosa in the wall of the gastric body. Several biopsies of the stomach were obtained which showed gastric antral mucosa with mild nonspecific reactive gastropathy, gastric oxyntic mucosa, and no evidence of Helicobacter pylori-like organisms.   CT of the head performed white inpatient was negative for any metastatic disease or intracranial cause of intractable nausea/ vomiting. CTA of the chest / CT AP also performed showed no evidence of PE or other intrathoracic or intraabdominal pathologies. CTA did however show moderate to severe pancolonic diverticulosis without superimposed acute inflammatory changes, and the known diffuse osseous metastatic disease with a stable L3 pathologic compression.   Hospital course included Zofran and a bowel regimen with MiraLAX and Senokot. She was evaluated by GI prior to discharge and recommended daily Protonix and Zofran in the setting of gastritis   The patient continues to follow with palliative medicine. Her pain is well managed on Xtampza (q12 hrs) and percocet (q6 hrs prn)   Patient reports to be doing well overall today. She is still experiencing a significant amount of  pain. She states that the radiation provided some relief but is still using oxycodone to control her pain. She states her pain is a 7 just after taking her pain medication. She denies any  GI upset  Allergies:  has No Known Allergies.  Meds: Current Outpatient Medications  Medication Sig Dispense Refill   abemaciclib (VERZENIO) 50 MG tablet Take 1 tablet (50 mg total) by mouth 2 (two) times daily. 60 tablet 3   albuterol (VENTOLIN HFA) 108 (90 Base) MCG/ACT inhaler Inhale 2 puffs by mouth into the lungs every 6 hours as needed for wheezing or shortness of breath. 18 g 2   anastrozole (ARIMIDEX) 1 MG tablet Take 1 tablet (1 mg total) by mouth daily. 90 tablet 3   calcium-vitamin D (OSCAL WITH D) 500-5 MG-MCG tablet Take 1 tablet by mouth daily with supper. 90 tablet 2   escitalopram (LEXAPRO) 20 MG tablet Take 1 tablet (20 mg total) by mouth daily. 90 tablet 1   hydrochlorothiazide (HYDRODIURIL) 25 MG tablet Take 1 tablet (25 mg total) by mouth daily. HOLD until follow up with your primary doctor 30 tablet 0   montelukast (SINGULAIR) 10 MG tablet Take 1 tablet (10 mg total) by mouth at bedtime. 90 tablet 3   ondansetron (ZOFRAN) 4 MG tablet Take 1 tablet (4 mg total) by mouth every 8 (eight) hours. 90 tablet 11   oxyCODONE ER 9 MG C12A Take 9 mg by mouth 2 (two) times daily. 30 capsule 0   oxyCODONE-acetaminophen (PERCOCET) 7.5-325 MG tablet Take 1 - 2 tablets by mouth every 6 (six) hours as needed for severe pain. 102 tablet 0   pantoprazole (PROTONIX) 40 MG tablet Take 1 tablet (40 mg total) by mouth daily. 30 tablet 3   polyethylene glycol (MIRALAX / GLYCOLAX) 17 g packet Take 17 g by mouth 2 (two) times daily. 60 each 3   prochlorperazine (COMPAZINE) 10 MG tablet Take 1 tablet (10 mg total) by mouth every 6 (six) hours as needed for nausea or vomiting. 30 tablet 0   propranolol (INDERAL) 10 MG tablet Take 1 tablet (10 mg total) by mouth 2 (two) times daily. 180 tablet 3   senna (SENOKOT) 8.6 MG TABS tablet Take 2 tablets (17.2 mg total) by mouth at bedtime. 60 tablet 3   No current facility-administered medications for this encounter.   Facility-Administered Medications  Ordered in Other Encounters  Medication Dose Route Frequency Provider Last Rate Last Admin   0.9 %  sodium chloride infusion   Intravenous Continuous Causey, Charlestine Massed, NP   Stopped at 06/24/22 1309    Physical Findings: The patient is in no acute distress. Patient is alert and oriented.  height is 5' 11"$  (1.803 m) and weight is 207 lb 12.8 oz (94.3 kg). Her temporal temperature is 97.6 F (36.4 C). Her blood pressure is 136/84 and her pulse is 66. Her respiration is 20 and oxygen saturation is 100%. .  . Lungs are clear to auscultation bilaterally. Heart has regular rate and rhythm. No palpable cervical, supraclavicular, or axillary adenopathy. Abdomen soft, non-tender, normal bowel sounds.  Lower motor strength 5 out of 5 in both lower extremities  Lab Findings: Lab Results  Component Value Date   WBC 2.1 (L) 06/30/2022   HGB 10.0 (L) 06/30/2022   HCT 32.0 (L) 06/30/2022   MCV 75.9 (L) 06/30/2022   PLT 294 06/30/2022    Radiographic Findings: MR Lumbar Spine W Wo Contrast   Result Date: 06/07/2022 CLINICAL DATA:  52 year old female with breast cancer. Skeletal metastatic disease, recently negative on PET-CT EXAM: MRI LUMBAR SPINE WITHOUT AND WITH CONTRAST TECHNIQUE: Multiplanar and multiecho pulse sequences of the lumbar spine were obtained without and with intravenous contrast. CONTRAST:  31m GADAVIST GADOBUTROL 1 MMOL/ML IV SOLN COMPARISON:  PET-CT 06/02/2022. FINDINGS: Segmentation: Normal with hypoplastic ribs at T12 on the comparison. Alignment: Mild straightening of lumbar lordosis. Minor levoconvex scoliosis. No significant spondylolisthesis. Vertebrae: Pathologic L3 vertebral body compression fracture with mild comminution, superior and inferior endplate deformities. Patchy enhancement there following contrast, but little to no associated marrow edema on STIR imaging (series 6, image 8), and not FDG avid 4 days ago. Scattered additional sclerotic appearing spinal metastases  with decreased T1 and T2 signal, most pronounced in the S1 body. No significant marrow edema or enhancement. No epidural or extraosseous tumor extension identified. Conus medullaris and cauda equina: Conus extends to the T12-L1 level. No lower spinal cord or conus signal abnormality. No abnormal intradural enhancement. Normal cauda equina nerve roots. No dural thickening. Paraspinal and other soft tissues: Stable to the recent PET-CT Disc levels: Capacious spinal canal. Lumbar disc degeneration primarily L2-L3 and L3-L4 with disc bulging. No spinal stenosis. Mild to moderate lateral recess and/or foraminal stenosis at the right L2, right L3, bilateral L3 nerve levels. IMPRESSION: 1. Skeletal metastatic disease which is likely treated/quiescent - including an L3 pathologic vertebral body fracture with mild comminution and enhancement, but little if any marrow edema and did NOT appear hypermetabolic on recent PET-CT. 2. No lumbar dural or intrathecal metastases. And capacious spinal canal with no spinal stenosis despite some degeneration. Electronically Signed   By: HGenevie AnnM.D.   On: 06/07/2022 08:35    NM PET Image Initial (PI) Skull Base To Thigh   Result Date: 06/02/2022 CLINICAL DATA:  Subsequent treatment strategy for breast cancer. EXAM: NUCLEAR MEDICINE PET SKULL BASE TO THIGH TECHNIQUE: 11.89 mCi F-18 FDG was injected intravenously. Full-ring PET imaging was performed from the skull base to thigh after the radiotracer. CT data was obtained and used for attenuation correction and anatomic localization. Fasting blood glucose: 108 mg/dl COMPARISON:  Multiple priors including most recent CT chest May 11, 2022 FINDINGS: Mediastinal blood pool activity: SUV max 1.1 Liver activity: SUV max NA NECK: No hypermetabolic cervical adenopathy. Incidental CT findings: None. CHEST: No hypermetabolic thoracic adenopathy. No hypermetabolic pulmonary nodules or masses. Postsurgical change of prior right breast  lumpectomy with a non hypermetabolic 2.2 x 1.5 cm soft tissue density in the surgical bed with overlying skin thickening on image 105/2, compatible with postsurgical change. Postsurgical change in the left breast with non hypermetabolic band of soft tissue density measuring 2.6 x 0.9 cm on image 99/2 compatible with postsurgical change. Incidental CT findings: Aortic atherosclerosis. Mild cardiac enlargement. Small hiatal hernia. ABDOMEN/PELVIS: No abnormal hypermetabolic activity within the liver, pancreas, adrenal glands, or spleen. No hypermetabolic lymph nodes in the abdomen or pelvis. Incidental CT findings: Colonic diverticulosis without findings of acute diverticulitis. SKELETON: No abnormal FDG avidity in the multifocal sclerotic and mixed lytic and sclerotic lesions involving the spine, ribs sacrum pelvic bones and bilateral proximal femurs. Incidental CT findings: None. IMPRESSION: 1. Postsurgical change of bilateral breast lumpectomy without evidence of hypermetabolic local recurrence. 2. No abnormal FDG avidity associated with the multifocal sclerotic and mixed lytic/sclerotic osseous lesions throughout the axial and proximal appendicular skeleton. 3. No evidence of hypermetabolic soft tissue metastatic disease. 4. Colonic diverticulosis without findings of acute diverticulitis. 5.  Aortic Atherosclerosis (ICD10-I70.0).  Electronically Signed   By: Dahlia Bailiff M.D.   On: 06/02/2022 10:46    CT Chest W Contrast   Result Date: 05/13/2022 CLINICAL DATA:  A 52 year old female with history of breast cancer presents for follow-up. * Tracking Code: BO * EXAM: CT CHEST WITH CONTRAST TECHNIQUE: Multidetector CT imaging of the chest was performed during intravenous contrast administration. RADIATION DOSE REDUCTION: This exam was performed according to the departmental dose-optimization program which includes automated exposure control, adjustment of the mA and/or kV according to patient size and/or use of  iterative reconstruction technique. CONTRAST:  63m OMNIPAQUE IOHEXOL 300 MG/ML  SOLN COMPARISON:  July of 2018. FINDINGS: Cardiovascular: Mild cardiac enlargement. No aortic dilation. Central pulmonary vasculature is unremarkable on venous phase. Collateral pathways about the chest suggest some subclavian venous narrowing on the LEFT. This could also be related to arm position. Mediastinum/Nodes: No thoracic inlet, axillary, mediastinal or hilar adenopathy. Esophagus grossly normal. Lungs/Pleura: No effusion. No consolidative changes. Airways are patent. Basilar atelectasis. Upper Abdomen: Incidental imaging of upper abdominal contents without acute process. Imaged portions the liver, pancreas, spleen, adrenal glands and kidneys are unremarkable. Musculoskeletal: Sclerotic lesions in the spine not present on more remote imaging. Areas of sclerosis at multiple levels. Vertebral body involvement and posterior element involvement at T9 for example with area of greatest involvement at this vertebral level. Sclerosis in the vertebral body measuring 2.7 x 1.4 cm. Posterior element sclerosis at T1. Involvement at T4, T5, T6, T7, T8, T9 as above, T10, T11 and T12. Sclerotic rib lesions also present in the bilateral chest most notably at the LEFT 6 posterior rib. No frankly destructive bone findings or acute bone process. Signs of RIGHT breast lumpectomy. Surgical clips in the LEFT breast as well, lateral to inferior surgical clips in the LEFT breast is an area of added density measuring 16 x 17 mm that is more focal than other areas. IMPRESSION: 1. Multifocal sclerotic bony metastatic disease greatest in the thoracic spine with scattered rib lesions and sclerosis as well. 2. No nodal or visceral metastasis about the chest. 3. Collateral pathways about the chest suggest some subclavian venous narrowing on the LEFT. This could also be related to arm position. Correlate clinically. 4. Signs of RIGHT breast lumpectomy.  Potential postoperative changes in the LEFT breast with small focal area of nodularity in the lateral LEFT breast which could also be postoperative, correlate clinically and consider mammographic correlation as warranted. 5. Mild cardiac enlargement. Electronically Signed   By: GZetta BillsM.D.   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 Unfortunately, the radiotherapy has not provided much pain benefit.  Plan: Follow up with radiation oncology on an as needed bases at this time. Patient should resume routine follow up with medical oncology.  It was a pleasure taking part in this patient's care. She knows she can call back with questions or concerns at anytime.    ____________________________________   ELeona Singleton PA   JBlair Promise PhD, MD  This document serves as a record of services personally performed by JGery Pray MD. It was created on his  behalf by Roney Mans, a trained medical scribe. The creation of this record is based on the scribe's personal observations and the provider's statements to them. This document has been checked and approved by the attending provider.

## 2022-08-12 NOTE — Progress Notes (Signed)
  Radiation Oncology         [phone] ________________________________  Patient Name: Chelsea Hansen MRN: [id number] DOB: [birth year] Referring Physician: [name] (Profile Not Attached) Date of Service: 07/07/2022 [facility], [place]                                                        End Of Treatment Note  Diagnoses: C79.51-Secondary malignant neoplasm of bone  Cancer Staging: The primary encounter diagnosis was Carcinoma of breast metastatic to bone, unspecified laterality ([place]). Diagnoses of Malignant neoplasm of upper-outer quadrant of right breast in female, estrogen receptor positive ([place]), Malignant neoplasm of upper-outer quadrant of left breast in female, estrogen receptor positive ([place]), and Metastasis to bone [facility]) were also pertinent to this visit.   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 On the date of her final treatment, the patient endorsed generalized pain rated at a 7/10 (slightly improved with treatment), fatigue, and continued episodes of nausea and vomiting (somewhat improved). She denied any other symptoms or concerns.   Plan: The patient will follow-up with radiation oncology in one month .  ________________________________________________ -----------------------------------  Blair Promise, PhD, MD  This document serves as a record of services  personally performed by [name], MD. It was created on his behalf by Roney Mans, a trained medical scribe. The creation of this record is based on the scribe's personal observations and the provider's statements to them. This document has been checked and approved by the attending provider.

## 2022-08-12 NOTE — Progress Notes (Addendum)
  Radiation Oncology         [phone] ________________________________  Name: Chelsea Hansen MRN: [id number]  Date of Service: 08/12/2022  DOB: [birth year]  Diagnosis:  C79.51-Secondary malignant neoplasm of bone (as documented in provider follow-up note)  Follow-up nursing interview.  The patient did note fatigue during radiation which has remained moderate. The patient did note skin changes in the field of radiation during therapy but has since improved slightly. The patient has not noticed improvement in pain 9/10 in the area(s) treated with radiation but is controlled w/ Oxycodone. The patient is not taking dexamethasone. The patient does have occasional symptoms of weakness or loss of control of the extremities. The patient does have symptoms of headache 6/10 on occasion. The patient does not have symptoms of seizure or uncontrolled movement. The patient does have mild symptoms of changes in vision. The patient does not have changes in speech. The patient does not have confusion.   The patient is scheduled for ongoing care with Dr. Sondra Come in medical oncology. The patient was encouraged to call if she develops concerns or questions regarding radiation.  Meaningful use complete.   Vitals: BP 136/84 (BP Location: Right Arm, Patient Position: Sitting, Cuff Size: Normal)   Pulse 66   Temp 97.6 F (36.4 C)   Resp 20   Ht 5' 11"$  (1.803 m)   Wt 207 lb 12.8 oz (94.3 kg)   SpO2 100%   BMI 28.98 kg/m    This concludes the interview.   Leandra Kern, LPN

## 2022-08-13 ENCOUNTER — Other Ambulatory Visit: Payer: Self-pay | Admitting: Radiation Oncology

## 2022-08-13 DIAGNOSIS — Z170 Estrogen receptor positive status [ER+]: Secondary | ICD-10-CM

## 2022-08-13 DIAGNOSIS — C7951 Secondary malignant neoplasm of bone: Secondary | ICD-10-CM

## 2022-08-13 NOTE — Addendum Note (Signed)
Encounter addended by: Mollie Germany, LPN on: [id number] AM  Actions taken: Clinical Note Signed

## 2022-08-17 ENCOUNTER — Ambulatory Visit: Payer: Medicaid Other

## 2022-08-18 ENCOUNTER — Encounter: Payer: Self-pay | Admitting: *Deleted

## 2022-08-18 ENCOUNTER — Telehealth: Payer: Self-pay | Admitting: *Deleted

## 2022-08-18 NOTE — Telephone Encounter (Signed)
Received call from pt requesting detailed letter and office not from MD stating pt has Stage 4 breast cancer and is unable to work due to disease progression.  Pt states she is attempting to file for Social Security disability and has been in contact with [employer].  Letter provided to patient at front desk to pick up and RN messaged CSW Sharyn Lull to f/u with pt on the SS Disability process.

## 2022-08-19 ENCOUNTER — Inpatient Hospital Stay: Payer: Medicaid Other | Admitting: Licensed Clinical Social Worker

## 2022-08-19 ENCOUNTER — Ambulatory Visit: Payer: Medicaid Other

## 2022-08-19 DIAGNOSIS — L599 Disorder of the skin and subcutaneous tissue related to radiation, unspecified: Secondary | ICD-10-CM

## 2022-08-19 DIAGNOSIS — R209 Unspecified disturbances of skin sensation: Secondary | ICD-10-CM

## 2022-08-19 DIAGNOSIS — Z170 Estrogen receptor positive status [ER+]: Secondary | ICD-10-CM

## 2022-08-19 DIAGNOSIS — C50411 Malignant neoplasm of upper-outer quadrant of right female breast: Secondary | ICD-10-CM | POA: Diagnosis not present

## 2022-08-19 DIAGNOSIS — R293 Abnormal posture: Secondary | ICD-10-CM

## 2022-08-19 DIAGNOSIS — G893 Neoplasm related pain (acute) (chronic): Secondary | ICD-10-CM

## 2022-08-19 DIAGNOSIS — I890 Lymphedema, not elsewhere classified: Secondary | ICD-10-CM

## 2022-08-19 DIAGNOSIS — C7951 Secondary malignant neoplasm of bone: Secondary | ICD-10-CM

## 2022-08-19 NOTE — Therapy (Signed)
OUTPATIENT PHYSICAL THERAPY ONCOLOGY EVALUATION  Patient Name: Chelsea Hansen MRN: [id number] DOB:September 08, 1970, 52 y.o., female Today's Date: 08/19/2022  END OF SESSION:  PT End of Session - 08/19/22 0757     Visit Number 9    Number of Visits 16    Date for PT Re-Evaluation 09/09/22    Authorization Type Medicaid    Authorization - Visit Number 4    Authorization - Number of Visits 12    Progress Note Due on Visit 16    PT Start Time 0800    PT Stop Time [id number]    PT Time Calculation (min) 57 min    Activity Tolerance Patient tolerated treatment well    Behavior During Therapy [facility] for tasks assessed/performed             Past Medical History:  Diagnosis Date   Allergy    seasonal allergies   Anxiety    on meds   Asthma    uses inhaler   Breast cancer ([place]) 2012   RIGHT lumpectomy   Cancer ([place]) 2022   RIGHT breast lump-dx 2022   Depression    on meds   DVT (deep venous thrombosis) ([place]) 2010   after hysterectomy   Family history of uterine cancer    GERD (gastroesophageal reflux disease)    with certain foods/OTC PRN meds   Headache(784.0)    History of radiation therapy    Bilateral breast- 09/03/21-11/02/21- Dr. [name]   Hyperlipidemia    on meds   Hypertension    on meds   SVT (supraventricular tachycardia)    Past Surgical History:  Procedure Laterality Date   ABDOMINAL HYSTERECTOMY     AXILLARY SENTINEL NODE BIOPSY Left 07/09/2021   Procedure: LEFT AXILLARY SENTINEL NODE BIOPSY;  Surgeon: [name], MD;  Location: [place];  Service: General;  Laterality: Left;   BIOPSY  06/29/2022   Procedure: BIOPSY;  Surgeon: Doran Stabler, MD;  Location: [facility];  Service: Gastroenterology;;   BREAST EXCISIONAL BIOPSY Right 09/16/2009   BREAST LUMPECTOMY WITH RADIOACTIVE SEED LOCALIZATION Bilateral 07/09/2021   Procedure: BILATERAL BREAST LUMPECTOMY WITH RADIOACTIVE SEED LOCALIZATION;  Surgeon: [name], MD;   Location: [place];  Service: General;  Laterality: Bilateral;   BREAST SURGERY     lumpectomy   ESOPHAGOGASTRODUODENOSCOPY (EGD) WITH PROPOFOL N/A 06/29/2022   Procedure: ESOPHAGOGASTRODUODENOSCOPY (EGD) WITH PROPOFOL;  Surgeon: Doran Stabler, MD;  [facility];  Service: Gastroenterology;  Laterality: N/A;   PORT-A-CATH REMOVAL Left 07/09/2021   Procedure: REMOVAL PORT-A-CATH;  Surgeon: [name], MD;  Location: [place];  Service: General;  Laterality: Left;   PORTACATH PLACEMENT Left 01/26/2021   Procedure: INSERTION PORT-A-CATH;  Surgeon: [name], MD;  Location: [facility];  Service: General;  Laterality: Left;   RADIOACTIVE SEED GUIDED AXILLARY SENTINEL LYMPH NODE Right 07/09/2021   Procedure: RADIOACTIVE SEED GUIDED RIGHT AXILLARY SENTINEL LYMPH NODE DISSECTION;  Surgeon: [name], MD;  Location: [place];  Service: General;  Laterality: Right;   TUBAL LIGATION     Patient Active Problem List   Diagnosis Date Noted   Chronic constipation 06/30/2022   Nausea and vomiting in adult 06/29/2022   Hematemesis 06/28/2022   Metastasis to bone ([place]) 05/20/2022   PSVT (paroxysmal supraventricular tachycardia) 07/08/2021   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   GERD (gastroesophageal reflux disease) 05/27/2021   Hyperlipidemia 05/27/2021   Hypertension 05/27/2021   Pain management  contract signed 05/04/2021   Chronic pain syndrome (breast cancer) 05/04/2021   Palpitations 03/25/2021   Sinus bradycardia 03/25/2021   Daytime somnolence 03/25/2021   Snoring 03/25/2021   Obesity (BMI 30-39.9) 03/25/2021   Port-A-Cath in place 01/27/2021   Genetic testing 12/24/2020   Family history of uterine cancer 12/17/2020   Malignant neoplasm of upper-outer quadrant of right breast in female, estrogen receptor positive ([place]) 12/12/2020   Malignant neoplasm of upper-outer quadrant of left breast in female,  estrogen receptor positive ([place]) 12/12/2020   Hypokalemia 01/02/2012   Asthma 01/01/2012   Bradycardia 01/01/2012   Syncope 12/31/2011   Mediastinal adenopathy 12/31/2011   Anemia 12/31/2011   Chest pain 12/31/2011    PCP: Juluis Mire  REFERRING PROVIDER: [name]  REFERRING DIAG: Malignant neoplasm of upper-outer quadrant of right breast in female, estrogen receptor positive ([place])  Malignant neoplasm of upper-outer quadrant of left breast in female, estrogen receptor positive ([place])  Lymphedema, not elsewhere classified  Metastasis to bone ([place])  Neoplasm related pain (acute) (chronic)  THERAPY DIAG:  Malignant neoplasm of upper-outer quadrant of right breast in female, estrogen receptor positive ([place])  Malignant neoplasm of upper-outer quadrant of left breast in female, estrogen receptor positive ([place])  Lymphedema, not elsewhere classified  Metastasis to bone ([place])  Neoplasm related pain (acute) (chronic)  Abnormal posture  Disturbance of skin sensation  Disorder of the skin and subcutaneous tissue related to radiation, unspecified  ONSET DATE: 01/2021  Rationale for Evaluation and Treatment: Rehabilitation  SUBJECTIVE:                                                                                                                                                                                           SUBJECTIVE STATEMENT:  I have been in so much pain. My breasts are swelling again, and my back and hips are hurting. I had to leave  work yesterday because I was in so much pain. They said my disability won't go through until June. I have flexi touch trial next Tuesday at 9:00  PERTINENT HISTORY:  Neoadjuvant chemotherapy with Doxorubicin and Cyclophosphamide given x 4 beginning 01/27/2021 and completing on 03/10/2021 followed by weekly paclitaxel x 12 beginning 05/2021 07/09/2021: Right lumpectomy: Grade 2 invasive lobular cancer, 2.5 cm, LCIS, margins  negative, LCIS focally at anterior margin, 1/1 lymph node positive, ER 95%, PR 100%, HER2 negative, Ki-67 40% Left lumpectomy: High-grade DCIS: 0.7 cm, margins negative, 0/1 lymph node negative ER 95%, PR 100%. Pt now  STAGE IV Breast Cancer with bone mets throughout thoracic and lumbar regions and is pending radiation to her lumbar spine next week.  PAIN:  Are you having pain? Yes NPRS scale: 7/10 today legs, back, bilateral breast Pain location: LB Pain orientation: Bilateral  PAIN TYPE: aching, sharp,  Pain description:intermittent Aggravating factors: standing, sleeping, walking, stairs, housework Relieving factors: Pain meds,changing positions,  PRECAUTIONS: BONE METS currently s/p palliative radiation to spine, bilateral breast lymphedema, bilateral UE lymphedema risk  WEIGHT BEARING RESTRICTIONS: No  FALLS:  Has patient fallen in last 6 months? No  LIVING ENVIRONMENT: Lives with: currently living alone Lives [id number] presently  Has following equipment : Single point cane, Walker - 4 wheeled, shower chair, and bed side commode  OCCUPATION: mgr at [employer]  LEISURE: shop, dominoes  HAND DOMINANCE: right   PRIOR LEVEL OF FUNCTION: Independent  PATIENT GOALS:   Get back to life, keep breast swelling down, learn exercises to strengthen my back/core,keep shoulder ROM on the right track   OBJECTIVE:  COGNITION: Overall cognitive status: Within functional limits for tasks assessed   PALPATION:   OBSERVATIONS / OTHER ASSESSMENTS: bilateral breasts with enlarged pores but improved since prior treatment session  SENSATION: Light touch: Appears intact   POSTURE: forward head, rounded shoulders  UPPER EXTREMITY AROM/PROM:  A/PROM RIGHT   eval   Shoulder extension 55  Shoulder flexion 157  Shoulder abduction 174  Shoulder internal rotation 65  Shoulder external rotation 98    (Blank rows = not tested)  A/PROM LEFT   eval  Shoulder extension 56  Shoulder  flexion 156  Shoulder abduction 172  Shoulder internal rotation 65  Shoulder external rotation 95    (Blank rows = not tested)  CERVICAL AROM: All within functional limits:    LUMBAR ROM: Lumbar ROM without increasing pain; Bilateral SB decreased 25%, FB decreased 90%, BB decreased 90% all with pain  ANKLE  AROM DF : right -12,  LEFT  -8,   , Jul 22, 2022 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  HS Length: 30 deg Bilaterally, 07/22/2022  Left   35    Degrees           Right 45 Degrees  UPPER EXTREMITY STRENGTH: WFL  LOWER EXTREMITY MMT: WFL     LYMPHEDEMA ASSESSMENTS:   SURGERY TYPE/DATE: 07/09/2021 BILATERAL BREAST LUMPECTOMY WITH RADIOACTIVE SEED LOCALIZATION RADIOACTIVE SEED GUIDED RIGHT AXILLARY TARGETED LYMPH NODE DISSECTION LEFT AXILLARY SENTINEL NODE BIOPSY LEFT AXILLARY LYMPH NODE MAPPING WITH SENTIMAG    NUMBER OF LYMPH NODES REMOVED: Right 1/1, Left 0/1   CHEMOTHERAPY:  neoadjuvant    RADIATION:yes bilateral breast, recently finished palliative radiation for bone METS to spine    HORMONE TREATMENT: YES  INFECTIONS: NO  LYMPHEDEMA ASSESSMENTS:     LANDMARK RIGHT  03/04/2022 Right   10 cm proximal to olecranon process 30.5   Olecranon process 27.1   10 cm proximal to ulnar styloid process 21.2   Just proximal to ulnar styloid process 16.6   Across hand at thumb web space 21.4   At base of 2nd digit 6.8   (Blank rows = not tested)   LANDMARK LEFT  03/04/2022 LEFT  10 cm proximal to olecranon process 30.7   Olecranon process 26.7   10 cm proximal to ulnar styloid process 20.5   Just proximal to ulnar styloid process 16.3   Across hand at thumb web space 21.1   At base of 2nd digit 6.7   (Blank rows = not tested)      GAIT: Distance walked: 20 feet Assistive device utilized: None Level of assistance: Complete Independence Comments: decreased heel strike, toe  off, sideways translation with some LOB  QUICK DASH SURVEY: not completed   TODAY'S  TREATMENT:   08/19/2022 Nu step Seat 10, UE 11, seat 3 resistance 3 x5 Meeks Decompression x5 ea 3 sec hold Hamstring stretch with strap x 3 bilaterally 30 sec hold Gentle lumbar trunk rotation with arms outstretches x 3 ea Pelvic tilts x 10 TrA with alternate arm flexion x 10,  TrA with heel slide x 5, left only held due to left hip pain Bilateral figure 4 stretch x3, SKTC x 3 Encouraged pt to do gentle activities when hurting to prevent stiffness/increased pain 08/12/2022  Nu-Step seat 10, UE 11, resistance 3 x 5 min In bars;march on ax x 10, march with alternate arm raise x 10 Heel raises on ax x 15 no HH Standing feet together 3 x 20 sec eyes closed Step and hold on ax forward and sideways  with same side hip and shoulder flexion x 10  Incline stretch 3 x 30 sec  On Table: Supine figure 4 stretch  TrA with hip abduction and yellow band x 10 Alternate knee touches with opposite hand with TrA x 10 ea                                                                                                                                  DATE: 08/10/2022 Nu-Step seat 10, UE 11, resistance 3 x 4 min Incline gastroc stretch in bars 3 x 30 sec In bars: step and hold on ax 2 x 5 forwards and sideways no HH Heel raises x 10 on ax, Marching x 10 on ax Tandem walking on ax beam , sidestepping on ax beam 4 lengths TrA and hip adductor bolster squeeze x 10 TrA with march and alternate UE flexion TrA march with alternate knee touch x10 ea side TrA with hip abduction yellow x 15  Bilateral figure 4 stretch x20 sec Updated HEP 08/06/2022 TrA with marching x 10 TrA with march and alternate UE flexion TrA march with alternate knee touch x10 ea side Bridge 2 x 5 Hip adductor ball squeeze x10 HS stretch with strap x 3 x 30 sec Supine horizontal abduction with TrA x 10 Standing Scapular retraction red with TrA x 10 Marching with TrA 2 x 10 Heel raises x 10 Tandem stance bilaterally x 20 Step and  hold on ax forward and sideways x 5 B 08/04/2022 Meeks decompression x 5ea to warm up HS stretch with strap supine 3 x 20 sec B,  right leg 45 degrees, left 36 degrees Bilateral gastroc stretch supine with strap 3 x 30 sec,   Supine TrA with march,2x 5, March with opposite arm raises 2 x 10 March with opposite knee touches 2 x 10 TrA with heel slides x 10 ea TrA with hip adductor purple ball squeeze Supine wand flexion and scaption x 5    07/22/2022 Reviewed in and out of bed and  hip hinge practicing hinge at counter to simulate brushing teeth Meeks decompression x 5ea to warm up HS stretch with strap supine 3 x 20 sec B,  right leg 45 degrees, left 36 degrees Bilateral gastroc stretch supine with strap 3 x 20 sec,   Supine TrA with march, x 10, March with opposite arm raises 2 x 10 Assessed DF and HS ROM and checked goals  07/13/2022 Meeks decompression exercises x 5 Tra contraction with alternating arm raises x 10 ea Tra contraction with bilateral shoulder flexion, scaption, horizontal abduction x5 Tra activation and alternate LE marching x 10, ball squeeze B, hip abd bilaterally x 10 Supine gentle figure 4 stretch x 3 bilaterally x 5-10 sec Supine wand flexion and scaption x5   07/06/2022  Instructed in sit to and from supine and practiced x 3 and hip hinge to protect back with activities at home. Initiated Meeks decompression exercises x 5 ea with 2-3 second hold and updated HEP Sitting heel and toe raises x 10 ea Sitting gastroc stretch x 3 with 20 sec hold Standing scapular retraction with yellow band x 10 Added all above to HEP. Advised pt she should never have pain with exercises and to back off if she does,   06/25/2022  Evaluation onl  PATIENT EDUCATION:  08/06/2022 Side Pull: Double Arm   On back, knees bent, feet flat. Arms perpendicular to body, shoulder level, elbows straight but relaxed. Pull arms out to sides, elbows straight. Resistance band comes across  collarbones, hands toward floor. Hold momentarily. Slowly return to starting position. Repeat _5-10__ times. Band color _yellow____   EDUCATION  08/10/2022 Access code;[email or web address] Bridge, ppt, ppt with march, hip adductor squeze, hip abduction  Access Code: [email or web address] URL: [email or web address] Date: 08/06/2022  Education details: discussed POC/activities that will be performedAccess Code: [email or web address] URL: [email or web address] Date: 07/22/2022 Prepared by: Cheral Almas  Exercises - Supine Hamstring Stretch with Strap  - 1 x daily - 7 x weekly - 3 sets - 10 reps, Meeks decompression x 5 Person educated: Patient Education method: Explanation Education comprehension: verbalized understanding  HOME EXERCISE PROGRAM:  Supine Hamstring Stretch with Strap  - 1 x daily - 7 x weekly - 3 sets - 10 reps, Meeks decompression x 5, supine horizontal abd with TrA activation and red band x 10, standing scapular retraction red x 10 ASSESSMENT:  CLINICAL IMPRESSION: Pt reported feeling much better and more energized after treatment.  Pain decreased to 5/ 10 on left and 0/10 on right after activity. Left side tighter with all stretches and held heel slides on left due to pain. Pt encouraged to do gentle ROM, flexibility exercises when hurting as she always feels better  OBJECTIVE IMPAIRMENTS: Abnormal gait, decreased activity tolerance, decreased balance, decreased ROM, decreased strength, increased edema, impaired sensation, improper body mechanics, and postural dysfunction.   ACTIVITY LIMITATIONS: lifting, bending, standing, sleeping, stairs, bed mobility, and locomotion level  PARTICIPATION LIMITATIONS: cleaning, laundry, shopping, and occupation  PERSONAL FACTORS: 3+ comorbidities: bilateral Breast Cancer s/p Radiation with new bone Metastasis and palliative radiation for pain  are also affecting patient's functional outcome.   REHAB POTENTIAL: Good  CLINICAL DECISION  MAKING: Evolving/moderate complexity  EVALUATION COMPLEXITY: Low  GOALS: Goals reviewed with patient? Yes  SHORT TERM GOALS: Target date: 07/22/2022  Pt will be independent in a HEP for gentle lumbar/core neutral spine activities and LE flexibility Baseline: Goal status:MET 07/22/2022  2.  Pt will be able to get in and out of bed and brush teeth  with good body mechanics Baseline: no knowledge Goal status:MET for in/out of bed, nearly Met for hip hinge  LONG TERM GOALS: Target date: 2/29/2023  Pt will be approved for Flexi touch Sequential compression pump for management of bilateral breast edema Baseline:  Goal status:MET but not set up yet. Pt is moving 2.  Pt will have AROM for bilateral DF improved to atleast 5 degrees for improved gait pattern Baseline:  Goal status:In Progress  3.  Pt will be educated in proper body mechanics for home activities to prevent injury Baseline: no knowledge Goal status:In progress  4.  Pt will ambulate with improved heel strike /toe off and decreased sideways translation for improved energy conservation Baseline:  Goal status:In progress 5. Pt will be independent in and tolerant of higher level core strength/stability to decrease risk of injury  Baseline:no Knowledge  NEW  PLAN:  PT FREQUENCY: 2x/week  PT DURATION:6 weeks  PLANNED INTERVENTIONS: Therapeutic exercises, Therapeutic activity, Neuromuscular re-education, Balance training, Patient/Family education, Self Care, Orthotic/Fit training, Aquatic Therapy, Manual lymph drainage, Manual therapy, and Re-evaluation  PLAN FOR NEXT SESSION: update HEP,check on compression sleeves,Flexi set up in clinic next Tuesday at 9?hip hinge,  cont gastroc and HS stretches,,test balance, gentle core strength, STM prn for chest/muscular tightness, shoulder ROM prn, update HEP with supine stab exs, add standing theraband exs   Claris Pong, PT 08/19/2022, 9:00 AM

## 2022-08-19 NOTE — Progress Notes (Signed)
[facility] CSW Progress Note  Clinical Education officer, museum contacted patient by phone to follow-up on social security disability confusion. Pt reported that she had called [employer] and they stated that her application would have a decision around June/July. Pt decided to try to gather more documents for them.   CSW checked with [facility] who assisted with pt's application. Per Elmo Putt with [facility], "On  July 26, 2022, we contacted [employer] regarding her status. It was stated nothing was needed and her claim was pending a decision." Notified pt of the above and encouraged her to contact [facility] with any questions about her application.    Bertrand Vowels E Damiah Mcdonald, LCSW

## 2022-08-20 ENCOUNTER — Ambulatory Visit: Payer: Medicaid Other | Attending: Radiation Oncology

## 2022-08-20 DIAGNOSIS — C50411 Malignant neoplasm of upper-outer quadrant of right female breast: Secondary | ICD-10-CM | POA: Diagnosis present

## 2022-08-20 DIAGNOSIS — C7951 Secondary malignant neoplasm of bone: Secondary | ICD-10-CM | POA: Insufficient documentation

## 2022-08-20 DIAGNOSIS — R293 Abnormal posture: Secondary | ICD-10-CM | POA: Diagnosis present

## 2022-08-20 DIAGNOSIS — Z170 Estrogen receptor positive status [ER+]: Secondary | ICD-10-CM | POA: Insufficient documentation

## 2022-08-20 DIAGNOSIS — C50412 Malignant neoplasm of upper-outer quadrant of left female breast: Secondary | ICD-10-CM | POA: Diagnosis present

## 2022-08-20 DIAGNOSIS — G893 Neoplasm related pain (acute) (chronic): Secondary | ICD-10-CM | POA: Insufficient documentation

## 2022-08-20 DIAGNOSIS — I890 Lymphedema, not elsewhere classified: Secondary | ICD-10-CM

## 2022-08-20 DIAGNOSIS — R209 Unspecified disturbances of skin sensation: Secondary | ICD-10-CM | POA: Diagnosis present

## 2022-08-20 NOTE — Therapy (Signed)
OUTPATIENT PHYSICAL THERAPY ONCOLOGY EVALUATION  Patient Name: Chelsea Hansen MRN: [id number] DOB:1971/06/19, 52 y.o., female Today's Date: 08/20/2022  END OF SESSION:  PT End of Session - 08/20/22 0905     Visit Number 10    Number of Visits 16    Date for PT Re-Evaluation 09/09/22    Authorization Type Medicaid    Authorization - Visit Number 5    Authorization - Number of Visits 12    PT Start Time 0906    PT Stop Time [id number]    PT Time Calculation (min) 48 min    Activity Tolerance Patient tolerated treatment well    Behavior During Therapy [facility] for tasks assessed/performed             Past Medical History:  Diagnosis Date   Allergy    seasonal allergies   Anxiety    on meds   Asthma    uses inhaler   Breast cancer ([place]) 2012   RIGHT lumpectomy   Cancer ([place]) 2022   RIGHT breast lump-dx 2022   Depression    on meds   DVT (deep venous thrombosis) ([place]) 2010   after hysterectomy   Family history of uterine cancer    GERD (gastroesophageal reflux disease)    with certain foods/OTC PRN meds   Headache(784.0)    History of radiation therapy    Bilateral breast- 09/03/21-11/02/21- Dr. [name]   Hyperlipidemia    on meds   Hypertension    on meds   SVT (supraventricular tachycardia)    Past Surgical History:  Procedure Laterality Date   ABDOMINAL HYSTERECTOMY     AXILLARY SENTINEL NODE BIOPSY Left 07/09/2021   Procedure: LEFT AXILLARY SENTINEL NODE BIOPSY;  Surgeon: [name], MD;  Location: [place];  Service: General;  Laterality: Left;   BIOPSY  06/29/2022   Procedure: BIOPSY;  Surgeon: Doran Stabler, MD;  Location: [facility];  Service: Gastroenterology;;   BREAST EXCISIONAL BIOPSY Right 09/16/2009   BREAST LUMPECTOMY WITH RADIOACTIVE SEED LOCALIZATION Bilateral 07/09/2021   Procedure: BILATERAL BREAST LUMPECTOMY WITH RADIOACTIVE SEED LOCALIZATION;  Surgeon: [name], MD;  Location: [place];   Service: General;  Laterality: Bilateral;   BREAST SURGERY     lumpectomy   ESOPHAGOGASTRODUODENOSCOPY (EGD) WITH PROPOFOL N/A 06/29/2022   Procedure: ESOPHAGOGASTRODUODENOSCOPY (EGD) WITH PROPOFOL;  Surgeon: Doran Stabler, MD;  [facility];  Service: Gastroenterology;  Laterality: N/A;   PORT-A-CATH REMOVAL Left 07/09/2021   Procedure: REMOVAL PORT-A-CATH;  Surgeon: [name], MD;  Location: [place];  Service: General;  Laterality: Left;   PORTACATH PLACEMENT Left 01/26/2021   Procedure: INSERTION PORT-A-CATH;  Surgeon: [name], MD;  Location: [facility];  Service: General;  Laterality: Left;   RADIOACTIVE SEED GUIDED AXILLARY SENTINEL LYMPH NODE Right 07/09/2021   Procedure: RADIOACTIVE SEED GUIDED RIGHT AXILLARY SENTINEL LYMPH NODE DISSECTION;  Surgeon: [name], MD;  Location: [place];  Service: General;  Laterality: Right;   TUBAL LIGATION     Patient Active Problem List   Diagnosis Date Noted   Chronic constipation 06/30/2022   Nausea and vomiting in adult 06/29/2022   Hematemesis 06/28/2022   Metastasis to bone ([place]) 05/20/2022   PSVT (paroxysmal supraventricular tachycardia) 07/08/2021   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   GERD (gastroesophageal reflux disease) 05/27/2021   Hyperlipidemia 05/27/2021   Hypertension 05/27/2021   Pain management contract signed 05/04/2021   Chronic pain syndrome (breast  cancer) 05/04/2021   Palpitations 03/25/2021   Sinus bradycardia 03/25/2021   Daytime somnolence 03/25/2021   Snoring 03/25/2021   Obesity (BMI 30-39.9) 03/25/2021   Port-A-Cath in place 01/27/2021   Genetic testing 12/24/2020   Family history of uterine cancer 12/17/2020   Malignant neoplasm of upper-outer quadrant of right breast in female, estrogen receptor positive ([place]) 12/12/2020   Malignant neoplasm of upper-outer quadrant of left breast in female, estrogen receptor positive ([place]) 12/12/2020    Hypokalemia 01/02/2012   Asthma 01/01/2012   Bradycardia 01/01/2012   Syncope 12/31/2011   Mediastinal adenopathy 12/31/2011   Anemia 12/31/2011   Chest pain 12/31/2011    PCP: Juluis Mire  REFERRING PROVIDER: [name]  REFERRING DIAG: Malignant neoplasm of upper-outer quadrant of right breast in female, estrogen receptor positive ([place])  Malignant neoplasm of upper-outer quadrant of left breast in female, estrogen receptor positive ([place])  Lymphedema, not elsewhere classified  Metastasis to bone ([place])  Neoplasm related pain (acute) (chronic)  THERAPY DIAG:  Malignant neoplasm of upper-outer quadrant of right breast in female, estrogen receptor positive ([place])  Malignant neoplasm of upper-outer quadrant of left breast in female, estrogen receptor positive ([place])  Lymphedema, not elsewhere classified  Metastasis to bone ([place])  Neoplasm related pain (acute) (chronic)  Abnormal posture  Disturbance of skin sensation  ONSET DATE: 01/2021  Rationale for Evaluation and Treatment: Rehabilitation  SUBJECTIVE:                                                                                                                                                                                           SUBJECTIVE STATEMENT:  This place gives me energy. I went and did all kinds of things yesterday and I slept well last night.  PERTINENT HISTORY:  Neoadjuvant chemotherapy with Doxorubicin and Cyclophosphamide given x 4 beginning 01/27/2021 and completing on 03/10/2021 followed by weekly paclitaxel x 12 beginning 05/2021 07/09/2021: Right lumpectomy: Grade 2 invasive lobular cancer, 2.5 cm, LCIS, margins negative, LCIS focally at anterior margin, 1/1 lymph node positive, ER 95%, PR 100%, HER2 negative, Ki-67 40% Left lumpectomy: High-grade DCIS: 0.7 cm, margins negative, 0/1 lymph node negative ER 95%, PR 100%. Pt now  STAGE IV Breast Cancer with bone mets throughout thoracic and lumbar  regions and is pending radiation to her lumbar spine next week.    PAIN:  Are you having pain? Yes NPRS scale: 5/10 today in back, Pain location: LB Pain orientation: Bilateral  PAIN TYPE: aching, sharp,  Pain description:intermittent Aggravating factors: standing, sleeping, walking, stairs, housework Relieving factors: Pain meds,changing positions,  PRECAUTIONS: BONE METS currently s/p palliative radiation to spine, bilateral  breast lymphedema, bilateral UE lymphedema risk  WEIGHT BEARING RESTRICTIONS: No  FALLS:  Has patient fallen in last 6 months? No  LIVING ENVIRONMENT: Lives with: currently living alone Lives [id number] presently  Has following equipment : Single point cane, Walker - 4 wheeled, shower chair, and bed side commode  OCCUPATION: mgr at [employer]  LEISURE: shop, dominoes  HAND DOMINANCE: right   PRIOR LEVEL OF FUNCTION: Independent  PATIENT GOALS:   Get back to life, keep breast swelling down, learn exercises to strengthen my back/core,keep shoulder ROM on the right track   OBJECTIVE:  COGNITION: Overall cognitive status: Within functional limits for tasks assessed   PALPATION:   OBSERVATIONS / OTHER ASSESSMENTS: bilateral breasts with enlarged pores but improved since prior treatment session  SENSATION: Light touch: Appears intact   POSTURE: forward head, rounded shoulders  UPPER EXTREMITY AROM/PROM:  A/PROM RIGHT   eval   Shoulder extension 55  Shoulder flexion 157  Shoulder abduction 174  Shoulder internal rotation 65  Shoulder external rotation 98    (Blank rows = not tested)  A/PROM LEFT   eval  Shoulder extension 56  Shoulder flexion 156  Shoulder abduction 172  Shoulder internal rotation 65  Shoulder external rotation 95    (Blank rows = not tested)  CERVICAL AROM: All within functional limits:    LUMBAR ROM: Lumbar ROM without increasing pain; Bilateral SB decreased 25%, FB decreased 90%, BB decreased 90% all with  pain  ANKLE  AROM DF : right -12,  LEFT  -8,   , Jul 22, 2022 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  HS Length: 30 deg Bilaterally, 07/22/2022  Left   35    Degrees           Right 45 Degrees  UPPER EXTREMITY STRENGTH: WFL  LOWER EXTREMITY MMT: WFL     LYMPHEDEMA ASSESSMENTS:   SURGERY TYPE/DATE: 07/09/2021 BILATERAL BREAST LUMPECTOMY WITH RADIOACTIVE SEED LOCALIZATION RADIOACTIVE SEED GUIDED RIGHT AXILLARY TARGETED LYMPH NODE DISSECTION LEFT AXILLARY SENTINEL NODE BIOPSY LEFT AXILLARY LYMPH NODE MAPPING WITH SENTIMAG    NUMBER OF LYMPH NODES REMOVED: Right 1/1, Left 0/1   CHEMOTHERAPY:  neoadjuvant    RADIATION:yes bilateral breast, recently finished palliative radiation for bone METS to spine    HORMONE TREATMENT: YES  INFECTIONS: NO  LYMPHEDEMA ASSESSMENTS:     LANDMARK RIGHT  03/04/2022 Right   10 cm proximal to olecranon process 30.5   Olecranon process 27.1   10 cm proximal to ulnar styloid process 21.2   Just proximal to ulnar styloid process 16.6   Across hand at thumb web space 21.4   At base of 2nd digit 6.8   (Blank rows = not tested)   LANDMARK LEFT  03/04/2022 LEFT  10 cm proximal to olecranon process 30.7   Olecranon process 26.7   10 cm proximal to ulnar styloid process 20.5   Just proximal to ulnar styloid process 16.3   Across hand at thumb web space 21.1   At base of 2nd digit 6.7   (Blank rows = not tested)      GAIT: Distance walked: 20 feet Assistive device utilized: None Level of assistance: Complete Independence Comments: decreased heel strike, toe off, sideways translation with some LOB  QUICK DASH SURVEY: not completed   TODAY'S TREATMENT:   08/20/2022 Nu step seat 10, UE 10, resistance level 3 x 7;20  Marching on ax  x 10, heel raises x 15 no HH Step up  and hold on ax forward and  sideways x 10 bilaterally Incline stretch x 3, 30sec Supine HS stretch 3 x 30 sec B Figure 4 stretch x 3 ea 30 sec  Alternate hand to knee with TrA x  10 SLR x 5 B with ppt SKTC stretch x 3 B x 30  08/19/2022 Nu step Seat 10, UE 11, seat 3 resistance 3 x 5 Meeks Decompression x5 ea 3 sec hold Hamstring stretch with strap x 3 bilaterally 30 sec hold Gentle lumbar trunk rotation with arms outstretches x 3 ea Pelvic tilts x 10 TrA with alternate arm flexion x 10,  TrA with heel slide x 5, left only held due to left hip pain Bilateral figure 4 stretch x3, SKTC x 3 Encouraged pt to do gentle activities when hurting to prevent stiffness/increased pain 08/12/2022  Nu-Step seat 10, UE 11, resistance 3 x 5 min In bars;march on ax x 10, march with alternate arm raise x 10 Heel raises on ax x 15 no HH Standing feet together 3 x 20 sec eyes closed Step and hold on ax forward and sideways  with same side hip and shoulder flexion x 10  Incline stretch 3 x 30 sec  On Table: Supine figure 4 stretch  TrA with hip abduction and yellow band x 10 Alternate knee touches with opposite hand with TrA x 10 ea                                                                                                                                  DATE: 08/10/2022 Nu-Step seat 10, UE 11, resistance 3 x 4 min Incline gastroc stretch in bars 3 x 30 sec In bars: step and hold on ax 2 x 5 forwards and sideways no HH Heel raises x 10 on ax, Marching x 10 on ax Tandem walking on ax beam , sidestepping on ax beam 4 lengths TrA and hip adductor bolster squeeze x 10 TrA with march and alternate UE flexion TrA march with alternate knee touch x10 ea side TrA with hip abduction yellow x 15  Bilateral figure 4 stretch x20 sec Updated HEP 08/06/2022 TrA with marching x 10 TrA with march and alternate UE flexion TrA march with alternate knee touch x10 ea side Bridge 2 x 5 Hip adductor ball squeeze x10 HS stretch with strap x 3 x 30 sec Supine horizontal abduction with TrA x 10 Standing Scapular retraction red with TrA x 10 Marching with TrA 2 x 10 Heel raises x  10 Tandem stance bilaterally x 20 Step and hold on ax forward and sideways x 5 B 08/04/2022 Meeks decompression x 5ea to warm up HS stretch with strap supine 3 x 20 sec B,  right leg 45 degrees, left 36 degrees Bilateral gastroc stretch supine with strap 3 x 30 sec,   Supine TrA with march,2x 5, March with opposite arm raises 2 x 10 March with opposite knee touches 2 x 10 TrA  with heel slides x 10 ea TrA with hip adductor purple ball squeeze Supine wand flexion and scaption x 5   07/22/2022 Reviewed in and out of bed and hip hinge practicing hinge at counter to simulate brushing teeth Meeks decompression x 5ea to warm up HS stretch with strap supine 3 x 20 sec B,  right leg 45 degrees, left 36 degrees Bilateral gastroc stretch supine with strap 3 x 20 sec,   Supine TrA with march, x 10, March with opposite arm raises 2 x 10 Assessed DF and HS ROM and checked goals  07/13/2022 Meeks decompression exercises x 5 Tra contraction with alternating arm raises x 10 ea Tra contraction with bilateral shoulder flexion, scaption, horizontal abduction x5 Tra activation and alternate LE marching x 10, ball squeeze B, hip abd bilaterally x 10 Supine gentle figure 4 stretch x 3 bilaterally x 5-10 sec Supine wand flexion and scaption x5   07/06/2022  Instructed in sit to and from supine and practiced x 3 and hip hinge to protect back with activities at home. Initiated Meeks decompression exercises x 5 ea with 2-3 second hold and updated HEP Sitting heel and toe raises x 10 ea Sitting gastroc stretch x 3 with 20 sec hold Standing scapular retraction with yellow band x 10 Added all above to HEP. Advised pt she should never have pain with exercises and to back off if she does,   06/25/2022  Evaluation onl  PATIENT EDUCATION:  08/06/2022 Side Pull: Double Arm   On back, knees bent, feet flat. Arms perpendicular to body, shoulder level, elbows straight but relaxed. Pull arms out to sides, elbows  straight. Resistance band comes across collarbones, hands toward floor. Hold momentarily. Slowly return to starting position. Repeat _5-10__ times. Band color _yellow____   EDUCATION  08/10/2022 Access code;[email or web address] Bridge, ppt, ppt with march, hip adductor squeze, hip abduction  Access Code: [email or web address] URL: [email or web address] Date: 08/06/2022  Education details: discussed POC/activities that will be performedAccess Code: [email or web address] URL: [email or web address] Date: 07/22/2022 Prepared by: Cheral Almas  Exercises - Supine Hamstring Stretch with Strap  - 1 x daily - 7 x weekly - 3 sets - 10 reps, Meeks decompression x 5 Person educated: Patient Education method: Explanation Education comprehension: verbalized understanding  HOME EXERCISE PROGRAM:  Supine Hamstring Stretch with Strap  - 1 x daily - 7 x weekly - 3 sets - 10 reps, Meeks decompression x 5, supine horizontal abd with TrA activation and red band x 10, standing scapular retraction red x 10 ASSESSMENT:  CLINICAL IMPRESSION: Pt felt great after last session and had a very good day that day.. She is still feeling better today and she has no pain in the hips today and lower level pain in LB. She did exceptionally well with the exercises with no increased pain. Legs fatigued with SLR.  OBJECTIVE IMPAIRMENTS: Abnormal gait, decreased activity tolerance, decreased balance, decreased ROM, decreased strength, increased edema, impaired sensation, improper body mechanics, and postural dysfunction.   ACTIVITY LIMITATIONS: lifting, bending, standing, sleeping, stairs, bed mobility, and locomotion level  PARTICIPATION LIMITATIONS: cleaning, laundry, shopping, and occupation  PERSONAL FACTORS: 3+ comorbidities: bilateral Breast Cancer s/p Radiation with new bone Metastasis and palliative radiation for pain  are also affecting patient's functional outcome.   REHAB POTENTIAL: Good  CLINICAL DECISION MAKING:  Evolving/moderate complexity  EVALUATION COMPLEXITY: Low  GOALS: Goals reviewed with patient? Yes  SHORT TERM GOALS: Target date: 07/22/2022  Pt will be independent in a HEP for gentle lumbar/core neutral  spine activities and LE flexibility Baseline: Goal status:MET 07/22/2022  2.  Pt will be able to get in and out of bed and brush teeth with good body mechanics Baseline: no knowledge Goal status:MET for in/out of bed, nearly Met for hip hinge  LONG TERM GOALS: Target date: 2/29/2023  Pt will be approved for Flexi touch Sequential compression pump for management of bilateral breast edema Baseline:  Goal status:MET but not set up yet. Pt is moving 2.  Pt will have AROM for bilateral DF improved to atleast 5 degrees for improved gait pattern Baseline:  Goal status:In Progress  3.  Pt will be educated in proper body mechanics for home activities to prevent injury Baseline: no knowledge Goal status:In progress  4.  Pt will ambulate with improved heel strike /toe off and decreased sideways translation for improved energy conservation Baseline:  Goal status:In progress 5. Pt will be independent in and tolerant of higher level core strength/stability to decrease risk of injury  Baseline:no Knowledge  NEW  PLAN:  PT FREQUENCY: 2x/week  PT DURATION:6 weeks  PLANNED INTERVENTIONS: Therapeutic exercises, Therapeutic activity, Neuromuscular re-education, Balance training, Patient/Family education, Self Care, Orthotic/Fit training, Aquatic Therapy, Manual lymph drainage, Manual therapy, and Re-evaluation  PLAN FOR NEXT SESSION: update HEP,check on compression sleeves,Flexi set up in clinic next Tuesday at 9?hip hinge,  cont gastroc and HS stretches,,test balance, gentle core strength, STM prn for chest/muscular tightness, shoulder ROM prn, update HEP with supine stab exs, add standing theraband exs   Claris Pong, PT 08/20/2022, 9:56 AM

## 2022-08-22 IMAGING — CT CT HEAD WO/W CM
5 of 6 series · 16 of 47 positions shown, 17 images · IV contrast (omnipaque)
Comparison: 12/31/2011.
COMPARISON: 12/31/2011.

Addendum:
CLINICAL DATA: Breast cancer

EXAM:
CT HEAD WITHOUT AND WITH CONTRAST
TECHNIQUE: Contiguous axial images were obtained from the base of the skull
through the vertex without and with intravenous contrast
CONTRAST:  75mL OMNIPAQUE IOHEXOL 350 MG/ML SOLN

[Series 3: head wo · axial · 0.47mm/px · z∈[+302,+348]mm · 2 of 29 slices shown, 3 images (1 of 2)]
[im 10/29  brain]
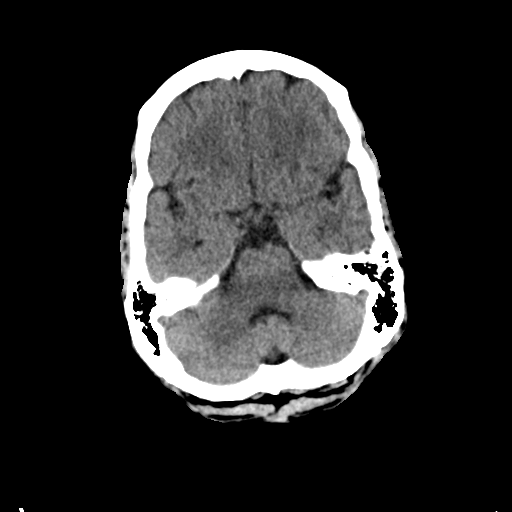
[im 10/29  bone]
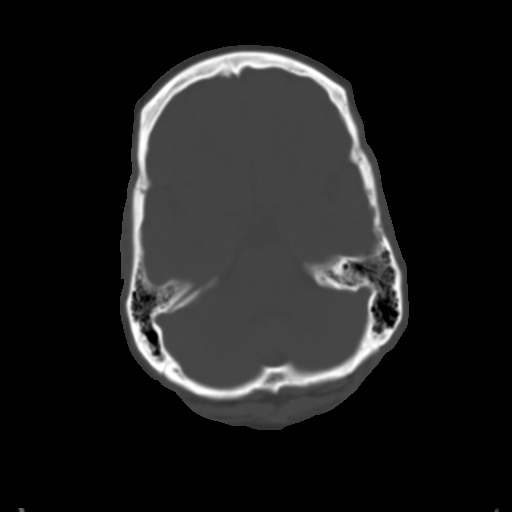
[im 19/29  brain]
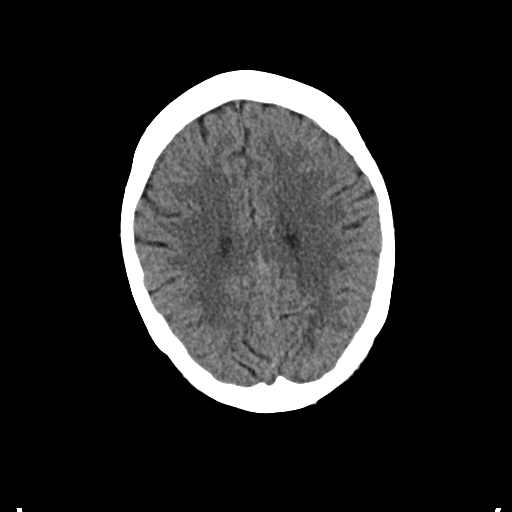

[Series 4: head bone · axial · 0.47mm/px · z∈[+272,+356]mm · 6 of 72 slices shown]
[im 8/72  bone]
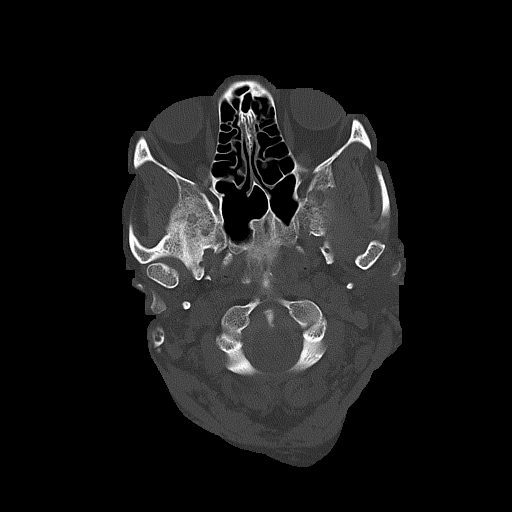
[im 15/72  bone]
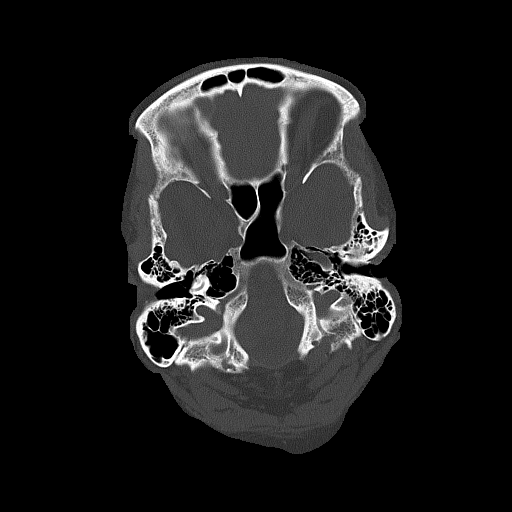
[im 22/72  bone]
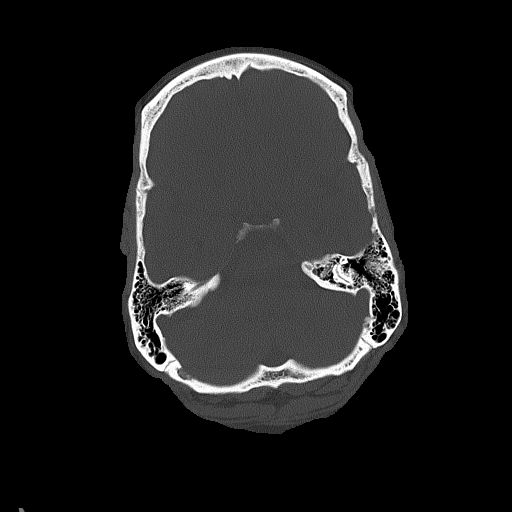
[im 29/72  bone]
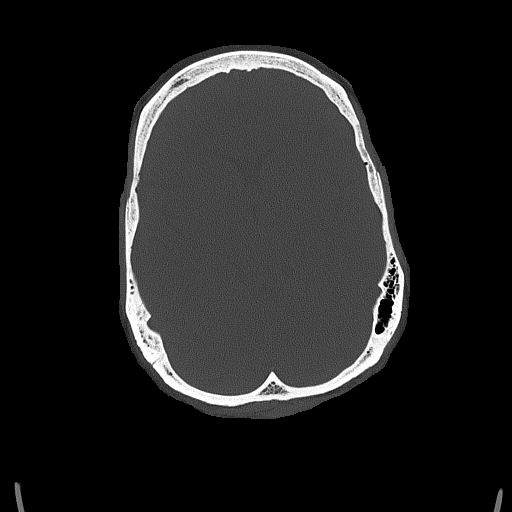
[im 43/72  bone]
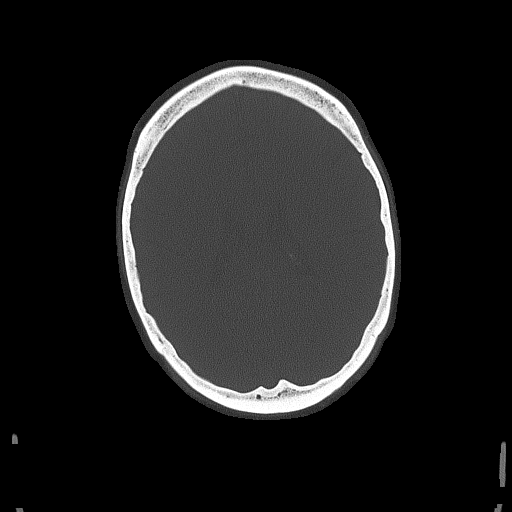
[im 50/72  bone]
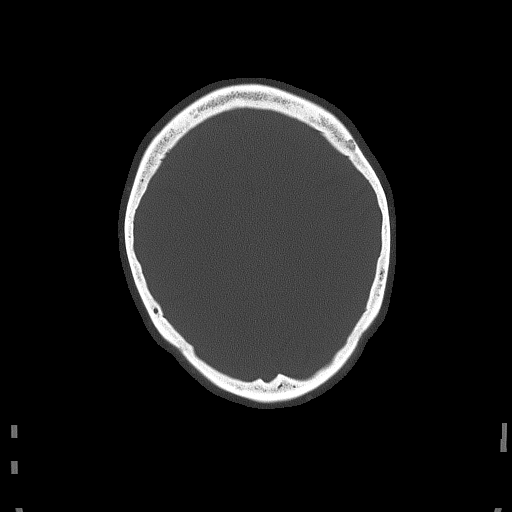

[Series 5: head wo · axial · 0.47mm/px · z∈[+302,+348]mm · 2 of 29 slices shown (2 of 2)]
[im 10/29  brain]
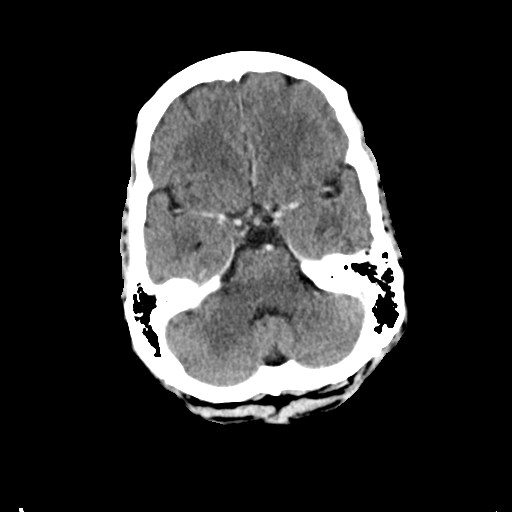
[im 19/29  brain]
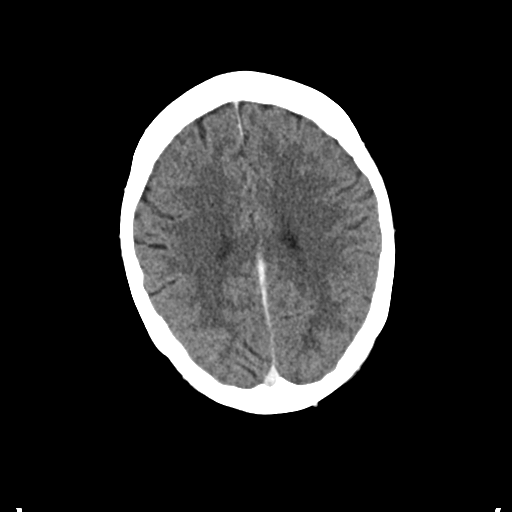

[Series 7: coronal soft tissue · coronal · 0.29mm/px · 3 of 63 slices shown]
[im 21/63  brain]
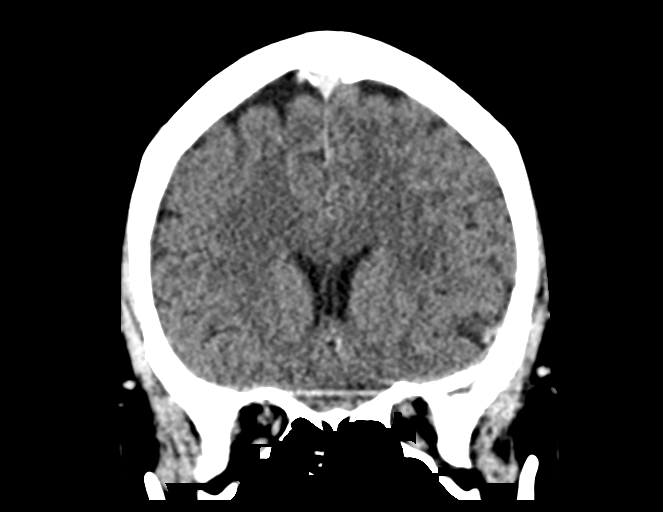
[im 28/63  brain]
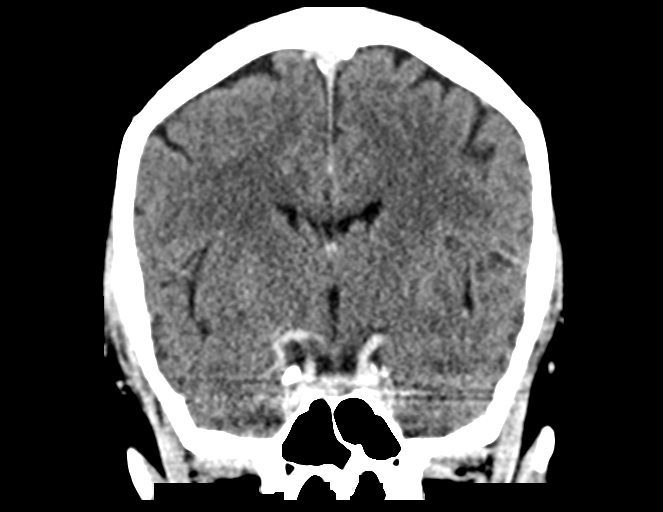
[im 35/63  brain]
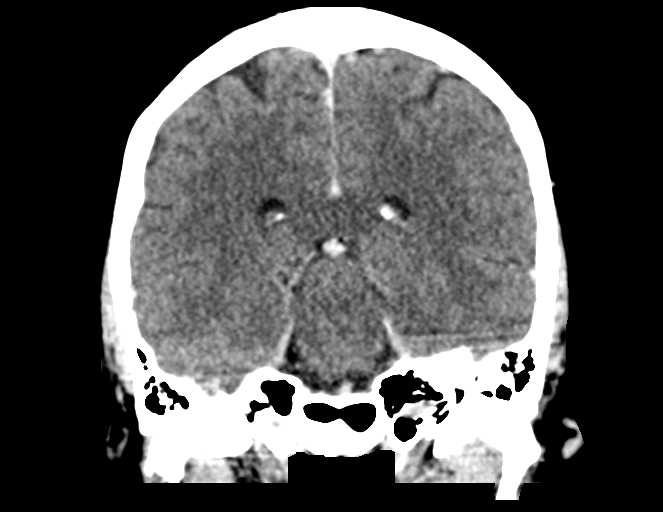

[Series 8: sagittal soft tissue · sagittal · 0.31mm/px · 3 of 52 slices shown]
[im 18/52  brain]
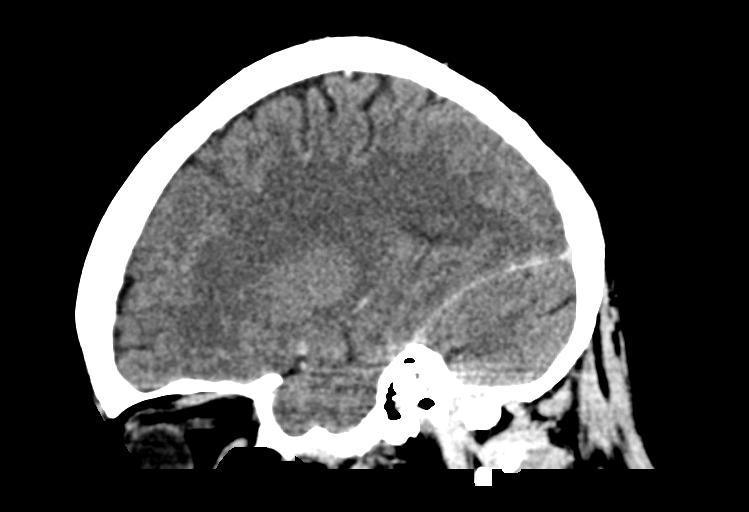
[im 26/52  brain]
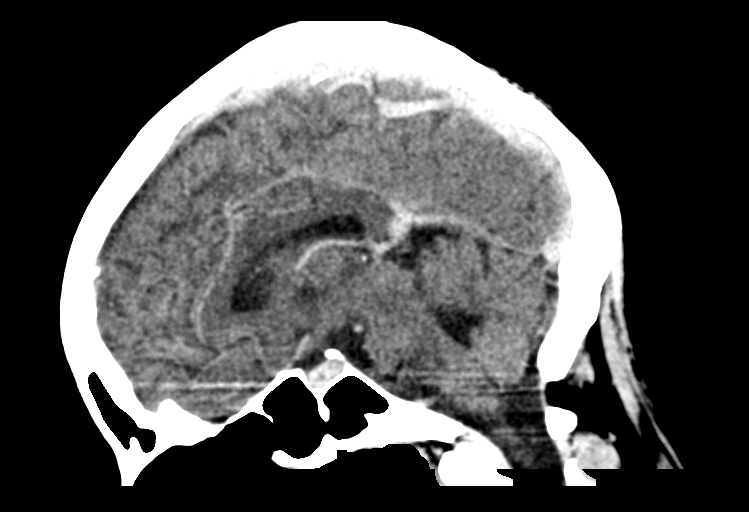
[im 35/52  brain]
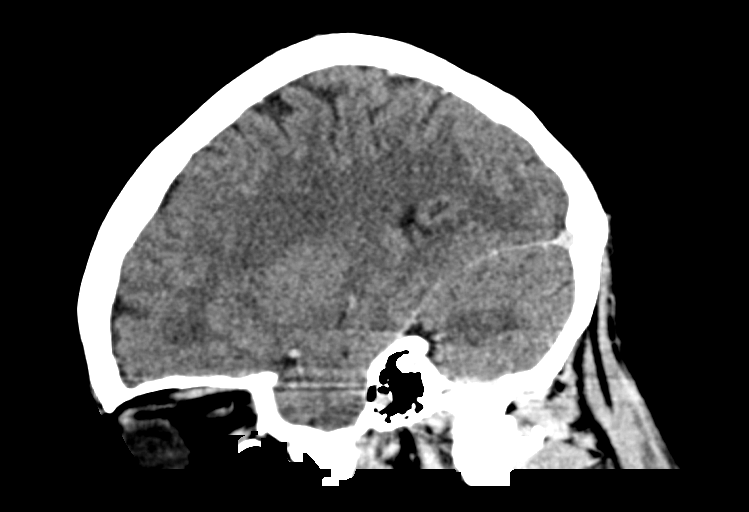

[16 of 47 positions shown; findings below may reference images not displayed]

FINDINGS: Brain: No evidence of acute infarction, hemorrhage, hydrocephalus,
extra-axial collection or mass lesion/mass effect. Please note that
evaluation for metastatic disease is limited in the absence of
intravenous contrast.

Vascular: No hyperdense vessel or unexpected calcification. Visible
vessels are patent.

Skull: Negative for fracture or suspicious lesion. Redemonstrated
small lucency in the right frontal calvarium, unchanged compared to
0821.

Sinuses/Orbits: No acute finding.

Other: None.
IMPRESSION: No acute intracranial process.

Please note that evaluation for parenchymal metastatic disease is
limited in the absence of intravenous contrast.

ADDENDUM:
Priors description of the study as being done without contrast were
in error.

On postcontrast imaging, no enhancing lesions are seen. No evidence
of metastatic disease.

*** End of Addendum ***
FINDINGS: Brain: No evidence of acute infarction, hemorrhage, hydrocephalus,
extra-axial collection or mass lesion/mass effect. Please note that
evaluation for metastatic disease is limited in the absence of
intravenous contrast.

Vascular: No hyperdense vessel or unexpected calcification. Visible
vessels are patent.

Skull: Negative for fracture or suspicious lesion. Redemonstrated
small lucency in the right frontal calvarium, unchanged compared to
0821.

Sinuses/Orbits: No acute finding.

Other: None.
IMPRESSION: No acute intracranial process.

Please note that evaluation for parenchymal metastatic disease is
limited in the absence of intravenous contrast.

## 2022-08-23 ENCOUNTER — Ambulatory Visit: Payer: Medicaid Other | Admitting: Adult Health

## 2022-08-23 ENCOUNTER — Other Ambulatory Visit: Payer: Medicaid Other

## 2022-08-23 ENCOUNTER — Other Ambulatory Visit: Payer: Self-pay | Admitting: Radiology

## 2022-08-23 ENCOUNTER — Ambulatory Visit (HOSPITAL_COMMUNITY)
Admission: RE | Admit: 2022-08-23 | Discharge: 2022-08-23 | Disposition: A | Discharge status: Home / Self Care | Payer: Medicaid Other | Source: Ambulatory Visit | Attending: Hematology and Oncology | Admitting: Hematology and Oncology

## 2022-08-23 DIAGNOSIS — Z170 Estrogen receptor positive status [ER+]: Secondary | ICD-10-CM

## 2022-08-23 DIAGNOSIS — C50411 Malignant neoplasm of upper-outer quadrant of right female breast: Secondary | ICD-10-CM | POA: Diagnosis present

## 2022-08-23 DIAGNOSIS — C50412 Malignant neoplasm of upper-outer quadrant of left female breast: Secondary | ICD-10-CM | POA: Insufficient documentation

## 2022-08-23 MED ORDER — SODIUM CHLORIDE (PF) 0.9 % IJ SOLN
INTRAMUSCULAR | Status: AC
  Filled 2022-08-23: qty 50

## 2022-08-23 MED ORDER — IOHEXOL 300 MG/ML  SOLN
100.0000 mL | Freq: Once | INTRAMUSCULAR | Status: AC | PRN
  Administered 2022-08-23: 100 mL via INTRAVENOUS

## 2022-08-24 ENCOUNTER — Ambulatory Visit: Payer: Medicaid Other

## 2022-08-24 DIAGNOSIS — G893 Neoplasm related pain (acute) (chronic): Secondary | ICD-10-CM

## 2022-08-24 DIAGNOSIS — C50411 Malignant neoplasm of upper-outer quadrant of right female breast: Secondary | ICD-10-CM | POA: Diagnosis not present

## 2022-08-24 DIAGNOSIS — Z170 Estrogen receptor positive status [ER+]: Secondary | ICD-10-CM

## 2022-08-24 DIAGNOSIS — C7951 Secondary malignant neoplasm of bone: Secondary | ICD-10-CM

## 2022-08-24 DIAGNOSIS — I890 Lymphedema, not elsewhere classified: Secondary | ICD-10-CM

## 2022-08-24 NOTE — Therapy (Signed)
OUTPATIENT PHYSICAL THERAPY ONCOLOGY EVALUATION  Patient Name: Chelsea Hansen MRN: [id number] DOB:1970-06-25, 52 y.o., female Today's Date: 08/24/2022  END OF SESSION:  PT End of Session - 08/24/22 0807     Visit Number 11    Number of Visits 16    Date for PT Re-Evaluation 09/09/22    Authorization Type Medicaid    Authorization Time Period 7    Authorization - Number of Visits 12    Progress Note Due on Visit 12    PT Start Time 0808    PT Stop Time 0855    PT Time Calculation (min) 47 min    Activity Tolerance Patient tolerated treatment well    Behavior During Therapy [facility] for tasks assessed/performed             Past Medical History:  Diagnosis Date   Allergy    seasonal allergies   Anxiety    on meds   Asthma    uses inhaler   Breast cancer ([place]) 2012   RIGHT lumpectomy   Cancer ([place]) 2022   RIGHT breast lump-dx 2022   Depression    on meds   DVT (deep venous thrombosis) ([place]) 2010   after hysterectomy   Family history of uterine cancer    GERD (gastroesophageal reflux disease)    with certain foods/OTC PRN meds   Headache(784.0)    History of radiation therapy    Bilateral breast- 09/03/21-11/02/21- Dr. [name]   Hyperlipidemia    on meds   Hypertension    on meds   SVT (supraventricular tachycardia)    Past Surgical History:  Procedure Laterality Date   ABDOMINAL HYSTERECTOMY     AXILLARY SENTINEL NODE BIOPSY Left 07/09/2021   Procedure: LEFT AXILLARY SENTINEL NODE BIOPSY;  Surgeon: [name], MD;  Location: [place];  Service: General;  Laterality: Left;   BIOPSY  06/29/2022   Procedure: BIOPSY;  Surgeon: Doran Stabler, MD;  Location: [facility];  Service: Gastroenterology;;   BREAST EXCISIONAL BIOPSY Right 09/16/2009   BREAST LUMPECTOMY WITH RADIOACTIVE SEED LOCALIZATION Bilateral 07/09/2021   Procedure: BILATERAL BREAST LUMPECTOMY WITH RADIOACTIVE SEED LOCALIZATION;  Surgeon: [name], MD;  Location:  [place];  Service: General;  Laterality: Bilateral;   BREAST SURGERY     lumpectomy   ESOPHAGOGASTRODUODENOSCOPY (EGD) WITH PROPOFOL N/A 06/29/2022   Procedure: ESOPHAGOGASTRODUODENOSCOPY (EGD) WITH PROPOFOL;  Surgeon: Doran Stabler, MD;  [facility];  Service: Gastroenterology;  Laterality: N/A;   PORT-A-CATH REMOVAL Left 07/09/2021   Procedure: REMOVAL PORT-A-CATH;  Surgeon: [name], MD;  Location: [place];  Service: General;  Laterality: Left;   PORTACATH PLACEMENT Left 01/26/2021   Procedure: INSERTION PORT-A-CATH;  Surgeon: [name], MD;  Location: [facility];  Service: General;  Laterality: Left;   RADIOACTIVE SEED GUIDED AXILLARY SENTINEL LYMPH NODE Right 07/09/2021   Procedure: RADIOACTIVE SEED GUIDED RIGHT AXILLARY SENTINEL LYMPH NODE DISSECTION;  Surgeon: [name], MD;  Location: [place];  Service: General;  Laterality: Right;   TUBAL LIGATION     Patient Active Problem List   Diagnosis Date Noted   Chronic constipation 06/30/2022   Nausea and vomiting in adult 06/29/2022   Hematemesis 06/28/2022   Metastasis to bone ([place]) 05/20/2022   PSVT (paroxysmal supraventricular tachycardia) 07/08/2021   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   GERD (gastroesophageal reflux disease) 05/27/2021   Hyperlipidemia 05/27/2021   Hypertension 05/27/2021   Pain management contract  signed 05/04/2021   Chronic pain syndrome (breast cancer) 05/04/2021   Palpitations 03/25/2021   Sinus bradycardia 03/25/2021   Daytime somnolence 03/25/2021   Snoring 03/25/2021   Obesity (BMI 30-39.9) 03/25/2021   Port-A-Cath in place 01/27/2021   Genetic testing 12/24/2020   Family history of uterine cancer 12/17/2020   Malignant neoplasm of upper-outer quadrant of right breast in female, estrogen receptor positive ([place]) 12/12/2020   Malignant neoplasm of upper-outer quadrant of left breast in female, estrogen  receptor positive ([place]) 12/12/2020   Hypokalemia 01/02/2012   Asthma 01/01/2012   Bradycardia 01/01/2012   Syncope 12/31/2011   Mediastinal adenopathy 12/31/2011   Anemia 12/31/2011   Chest pain 12/31/2011    PCP: Juluis Mire  REFERRING PROVIDER: [name]  REFERRING DIAG: Malignant neoplasm of upper-outer quadrant of right breast in female, estrogen receptor positive ([place])  Malignant neoplasm of upper-outer quadrant of left breast in female, estrogen receptor positive ([place])  Lymphedema, not elsewhere classified  Metastasis to bone ([place])  Neoplasm related pain (acute) (chronic)  THERAPY DIAG:  Malignant neoplasm of upper-outer quadrant of right breast in female, estrogen receptor positive ([place])  Malignant neoplasm of upper-outer quadrant of left breast in female, estrogen receptor positive ([place])  Lymphedema, not elsewhere classified  Metastasis to bone ([place])  Neoplasm related pain (acute) (chronic)  ONSET DATE: 01/2021  Rationale for Evaluation and Treatment: Rehabilitation  SUBJECTIVE:                                                                                                                                                                                           SUBJECTIVE STATEMENT:  I am tired. I have worked 4 days in a row.  I have been trying to sit and stand at work. I had a CT scan yesterday   PERTINENT HISTORY:  Neoadjuvant chemotherapy with Doxorubicin and Cyclophosphamide given x 4 beginning 01/27/2021 and completing on 03/10/2021 followed by weekly paclitaxel x 12 beginning 05/2021 07/09/2021: Right lumpectomy: Grade 2 invasive lobular cancer, 2.5 cm, LCIS, margins negative, LCIS focally at anterior margin, 1/1 lymph node positive, ER 95%, PR 100%, HER2 negative, Ki-67 40% Left lumpectomy: High-grade DCIS: 0.7 cm, margins negative, 0/1 lymph node negative ER 95%, PR 100%. Pt now  STAGE IV Breast Cancer with bone mets throughout thoracic and lumbar  regions and is pending radiation to her lumbar spine next week.    PAIN:  Are you having pain? Yes NPRS scale: 6/10 today in back, Pain location: LB Pain orientation: Bilateral  PAIN TYPE: aching, sharp,  Pain description:intermittent Aggravating factors: standing, sleeping, walking, stairs, housework Relieving factors: Pain meds,changing positions,  PRECAUTIONS:  BONE METS currently s/p palliative radiation to spine, bilateral breast lymphedema, bilateral UE lymphedema risk  WEIGHT BEARING RESTRICTIONS: No  FALLS:  Has patient fallen in last 6 months? No  LIVING ENVIRONMENT: Lives with: currently living alone Lives [id number] presently  Has following equipment : Single point cane, Walker - 4 wheeled, shower chair, and bed side commode  OCCUPATION: mgr at [employer]  LEISURE: shop, dominoes  HAND DOMINANCE: right   PRIOR LEVEL OF FUNCTION: Independent  PATIENT GOALS:   Get back to life, keep breast swelling down, learn exercises to strengthen my back/core,keep shoulder ROM on the right track   OBJECTIVE:  COGNITION: Overall cognitive status: Within functional limits for tasks assessed   PALPATION:   OBSERVATIONS / OTHER ASSESSMENTS: bilateral breasts with enlarged pores but improved since prior treatment session  SENSATION: Light touch: Appears intact   POSTURE: forward head, rounded shoulders  UPPER EXTREMITY AROM/PROM:  A/PROM RIGHT   eval   Shoulder extension 55  Shoulder flexion 157  Shoulder abduction 174  Shoulder internal rotation 65  Shoulder external rotation 98    (Blank rows = not tested)  A/PROM LEFT   eval  Shoulder extension 56  Shoulder flexion 156  Shoulder abduction 172  Shoulder internal rotation 65  Shoulder external rotation 95    (Blank rows = not tested)  CERVICAL AROM: All within functional limits:    LUMBAR ROM: Lumbar ROM without increasing pain; Bilateral SB decreased 25%, FB decreased 90%, BB decreased 90% all with  pain  ANKLE  AROM DF : right -12,  LEFT  -8,   , Jul 22, 2022 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  HS Length: 30 deg Bilaterally, 07/22/2022  Left   35    Degrees           Right 45 Degrees  UPPER EXTREMITY STRENGTH: WFL  LOWER EXTREMITY MMT: WFL     LYMPHEDEMA ASSESSMENTS:   SURGERY TYPE/DATE: 07/09/2021 BILATERAL BREAST LUMPECTOMY WITH RADIOACTIVE SEED LOCALIZATION RADIOACTIVE SEED GUIDED RIGHT AXILLARY TARGETED LYMPH NODE DISSECTION LEFT AXILLARY SENTINEL NODE BIOPSY LEFT AXILLARY LYMPH NODE MAPPING WITH SENTIMAG    NUMBER OF LYMPH NODES REMOVED: Right 1/1, Left 0/1   CHEMOTHERAPY:  neoadjuvant    RADIATION:yes bilateral breast, recently finished palliative radiation for bone METS to spine    HORMONE TREATMENT: YES  INFECTIONS: NO  LYMPHEDEMA ASSESSMENTS:     LANDMARK RIGHT  03/04/2022 Right   10 cm proximal to olecranon process 30.5   Olecranon process 27.1   10 cm proximal to ulnar styloid process 21.2   Just proximal to ulnar styloid process 16.6   Across hand at thumb web space 21.4   At base of 2nd digit 6.8   (Blank rows = not tested)   LANDMARK LEFT  03/04/2022 LEFT  10 cm proximal to olecranon process 30.7   Olecranon process 26.7   10 cm proximal to ulnar styloid process 20.5   Just proximal to ulnar styloid process 16.3   Across hand at thumb web space 21.1   At base of 2nd digit 6.7   (Blank rows = not tested)      GAIT: Distance walked: 20 feet Assistive device utilized: None Level of assistance: Complete Independence Comments: decreased heel strike, toe off, sideways translation with some LOB  QUICK DASH SURVEY: not completed   TODAY'S TREATMENT:   08/24/2022 Pt tried ppt but had difficulty activating Therapist demonstrated ppt on table and then pt reproduced x 10 Ppt with march 2 x 5  Ppt with alternate arms x 10 Figure 4 stretch x 3 ea 30 sec Ppt with alternate hand to knee 2x 10 Ppt with bolster squeeze x 10 SLR x 5 B with ppt SKTC  stretch x 3 B x 30 Nustep seat 10, UE 10 lev 4 x 7 min, 258 steps Incline gastroc stretch x 3 x 30 min Heel toe walking length of bars x 2 and retro walking x 2 SLS bilaterally x 3 ea; held 15-20 sec without LOB 08/20/2022 Nu step seat 10, UE 10, resistance level 3 x 7;20  Marching on ax  x 10, heel raises x 15 no HH Step up  and hold on ax forward and sideways x 10 bilaterally Incline stretch x 3, 30sec Supine HS stretch 3 x 30 sec B Figure 4 stretch x 3 ea 30 sec  Alternate hand to knee with TrA x 10 SLR x 5 B with ppt SKTC stretch x 3 B x 30  08/19/2022 Nu step Seat 10, UE 11, seat 3 resistance 3 x 5 Meeks Decompression x5 ea 3 sec hold Hamstring stretch with strap x 3 bilaterally 30 sec hold Gentle lumbar trunk rotation with arms outstretches x 3 ea Pelvic tilts x 10 TrA with alternate arm flexion x 10,  TrA with heel slide x 5, left only held due to left hip pain Bilateral figure 4 stretch x3, SKTC x 3 Encouraged pt to do gentle activities when hurting to prevent stiffness/increased pain 08/12/2022  Nu-Step seat 10, UE 11, resistance 3 x 5 min In bars;march on ax x 10, march with alternate arm raise x 10 Heel raises on ax x 15 no HH Standing feet together 3 x 20 sec eyes closed Step and hold on ax forward and sideways  with same side hip and shoulder flexion x 10  Incline stretch 3 x 30 sec  On Table: Supine figure 4 stretch  TrA with hip abduction and yellow band x 10 Alternate knee touches with opposite hand with TrA x 10 ea                                                                                                                                  DATE: 08/10/2022 Nu-Step seat 10, UE 11, resistance 3 x 4 min Incline gastroc stretch in bars 3 x 30 sec In bars: step and hold on ax 2 x 5 forwards and sideways no HH Heel raises x 10 on ax, Marching x 10 on ax Tandem walking on ax beam , sidestepping on ax beam 4 lengths TrA and hip adductor bolster squeeze x 10 TrA  with march and alternate UE flexion TrA march with alternate knee touch x10 ea side TrA with hip abduction yellow x 15  Bilateral figure 4 stretch x20 sec Updated HEP 08/06/2022 TrA with marching x 10 TrA with march and alternate UE flexion TrA march with alternate knee touch x10 ea side Bridge  2 x 5 Hip adductor ball squeeze x10 HS stretch with strap x 3 x 30 sec Supine horizontal abduction with TrA x 10 Standing Scapular retraction red with TrA x 10 Marching with TrA 2 x 10 Heel raises x 10 Tandem stance bilaterally x 20 Step and hold on ax forward and sideways x 5 B 08/04/2022 Meeks decompression x 5ea to warm up HS stretch with strap supine 3 x 20 sec B,  right leg 45 degrees, left 36 degrees Bilateral gastroc stretch supine with strap 3 x 30 sec,   Supine TrA with march,2x 5, March with opposite arm raises 2 x 10 March with opposite knee touches 2 x 10 TrA with heel slides x 10 ea TrA with hip adductor purple ball squeeze Supine wand flexion and scaption x 5   07/22/2022 Reviewed in and out of bed and hip hinge practicing hinge at counter to simulate brushing teeth Meeks decompression x 5ea to warm up HS stretch with strap supine 3 x 20 sec B,  right leg 45 degrees, left 36 degrees Bilateral gastroc stretch supine with strap 3 x 20 sec,   Supine TrA with march, x 10, March with opposite arm raises 2 x 10 Assessed DF and HS ROM and checked goals 06/25/2022  Evaluation only  PATIENT EDUCATION:  08/06/2022 Side Pull: Double Arm   On back, knees bent, feet flat. Arms perpendicular to body, shoulder level, elbows straight but relaxed. Pull arms out to sides, elbows straight. Resistance band comes across collarbones, hands toward floor. Hold momentarily. Slowly return to starting position. Repeat _5-10__ times. Band color _yellow____   EDUCATION  08/10/2022 Access code;[email or web address] Bridge, ppt, ppt with march, hip adductor squeze, hip abduction  Access Code: [email or web address] URL:  [email or web address] Date: 08/06/2022  Education details: discussed POC/activities that will be performedAccess Code: [email or web address] URL: [email or web address] Date: 07/22/2022 Prepared by: Cheral Almas  Exercises - Supine Hamstring Stretch with Strap  - 1 x daily - 7 x weekly - 3 sets - 10 reps, Meeks decompression x 5 Person educated: Patient Education method: Explanation Education comprehension: verbalized understanding  HOME EXERCISE PROGRAM:  Supine Hamstring Stretch with Strap  - 1 x daily - 7 x weekly - 3 sets - 10 reps, Meeks decompression x 5, supine horizontal abd with TrA activation and red band x 10, standing scapular retraction red x 10 ASSESSMENT:  CLINICAL IMPRESSION:  Pt had difficulty activating ppt initially but did well after demonstration by PT. Left hip figure 4 tighter than right. SLR more difficult and had to reset ppt each time. Able to maintain SLS for 15-20 sec  OBJECTIVE IMPAIRMENTS: Abnormal gait, decreased activity tolerance, decreased balance, decreased ROM, decreased strength, increased edema, impaired sensation, improper body mechanics, and postural dysfunction.   ACTIVITY LIMITATIONS: lifting, bending, standing, sleeping, stairs, bed mobility, and locomotion level  PARTICIPATION LIMITATIONS: cleaning, laundry, shopping, and occupation  PERSONAL FACTORS: 3+ comorbidities: bilateral Breast Cancer s/p Radiation with new bone Metastasis and palliative radiation for pain  are also affecting patient's functional outcome.   REHAB POTENTIAL: Good  CLINICAL DECISION MAKING: Evolving/moderate complexity  EVALUATION COMPLEXITY: Low  GOALS: Goals reviewed with patient? Yes  SHORT TERM GOALS: Target date: 07/22/2022  Pt will be independent in a HEP for gentle lumbar/core neutral spine activities and LE flexibility Baseline: Goal status:MET 07/22/2022  2.  Pt will be able to get in and out of bed and brush teeth with good body  mechanics Baseline: no knowledge Goal status:MET for in/out of bed, nearly  Met for hip hinge  LONG TERM GOALS: Target date: 2/29/2023  Pt will be approved for Flexi touch Sequential compression pump for management of bilateral breast edema Baseline:  Goal status:MET but not set up yet. Pt is moving 2.  Pt will have AROM for bilateral DF improved to atleast 5 degrees for improved gait pattern Baseline:  Goal status:In Progress  3.  Pt will be educated in proper body mechanics for home activities to prevent injury Baseline: no knowledge Goal status:In progress  4.  Pt will ambulate with improved heel strike /toe off and decreased sideways translation for improved energy conservation Baseline:  Goal status:In progress 5. Pt will be independent in and tolerant of higher level core strength/stability to decrease risk of injury  Baseline:no Knowledge  NEW  PLAN:  PT FREQUENCY: 2x/week  PT DURATION:6 weeks  PLANNED INTERVENTIONS: Therapeutic exercises, Therapeutic activity, Neuromuscular re-education, Balance training, Patient/Family education, Self Care, Orthotic/Fit training, Aquatic Therapy, Manual lymph drainage, Manual therapy, and Re-evaluation  PLAN FOR NEXT SESSION: update HEP,check on compression sleeves,Flexi set up in clinic next Tuesday at 9?hip hinge,  cont gastroc and HS stretches,,test balance, gentle core strength, STM prn for chest/muscular tightness, shoulder ROM prn, update HEP with supine stab exs, add standing theraband exs   Claris Pong, PT 08/24/2022, 9:00 AM

## 2022-08-26 ENCOUNTER — Ambulatory Visit: Payer: Medicaid Other

## 2022-08-26 ENCOUNTER — Ambulatory Visit (INDEPENDENT_AMBULATORY_CARE_PROVIDER_SITE_OTHER): Payer: Medicaid Other | Admitting: Primary Care

## 2022-08-30 NOTE — Progress Notes (Unsigned)
[place]  [phone] [phone]   Name: Chelsea Hansen Date: 08/30/2022 MRN: [id number]  DOB: April 27, 1971  Patient Care Team: [name], NP as PCP - General (Internal Medicine) [name], DO as PCP - Cardiology (Cardiology) [name], MD as Consulting Physician (Radiation Oncology) [name], MD as Consulting Physician (General Surgery) [name], MD as Consulting Physician (Hematology and Oncology) [name], Carlena Sax, NP as Nurse Practitioner (Nurse Practitioner)   INTERVAL HISTORY: Chelsea Hansen is a 52 y.o. 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  MRI of L-spine (12/23) showed L3 pathologic fracture and significant lesion along S1 with sclerotic changes.  Palliative ask to see for symptom management and goals of care.   SOCIAL HISTORY:     reports that she has never smoked. She has never used smokeless tobacco. She reports that she does not currently use alcohol after a past usage of about 7.0 standard drinks of alcohol per week. She reports that she does not use drugs.  ADVANCE DIRECTIVES:    CODE STATUS:   PAST MEDICAL HISTORY: Past Medical History:  Diagnosis Date   Allergy    seasonal allergies   Anxiety    on meds   Asthma    uses inhaler   Breast cancer ([place]) 2012   RIGHT lumpectomy   Cancer ([place]) 2022   RIGHT breast lump-dx 2022   Depression    on meds   DVT (deep venous thrombosis) ([place]) 2010   after hysterectomy   Family history of uterine cancer    GERD (gastroesophageal reflux disease)    with certain foods/OTC PRN meds   Headache(784.0)     History of radiation therapy    Bilateral breast- 09/03/21-11/02/21- Dr. [name]   Hyperlipidemia    on meds   Hypertension    on meds   SVT (supraventricular tachycardia)     ALLERGIES:  has No Known Allergies.  MEDICATIONS:  Current Outpatient Medications  Medication Sig Dispense Refill   abemaciclib (VERZENIO) 50 MG tablet Take 1 tablet (50 mg total) by mouth 2 (two) times daily. 60 tablet 3   albuterol (VENTOLIN HFA) 108 (90 Base) MCG/ACT inhaler Inhale 2 puffs by mouth into the lungs every 6 hours as needed for wheezing or shortness of breath. 18 g 2   anastrozole (ARIMIDEX) 1 MG tablet Take 1 tablet (1 mg total) by mouth daily. 90 tablet 3   calcium-vitamin D (OSCAL WITH D) 500-5 MG-MCG tablet Take 1 tablet by mouth daily with supper. 90 tablet 2   escitalopram (LEXAPRO) 20 MG tablet Take 1 tablet (20 mg total) by mouth daily. 90 tablet 1   hydrochlorothiazide (HYDRODIURIL) 25 MG tablet Take 1 tablet (25 mg total) by mouth daily. HOLD until follow up with your primary doctor 30 tablet 0   montelukast (SINGULAIR) 10 MG tablet Take 1 tablet (10 mg total) by mouth at bedtime. 90 tablet 3   ondansetron (ZOFRAN) 4 MG tablet Take 1 tablet (4 mg total) by mouth every 8 (eight) hours. 90 tablet 11   oxyCODONE ER 9 MG C12A Take 9 mg by mouth 2 (two) times daily. 30 capsule 0   oxyCODONE-acetaminophen (PERCOCET) 7.5-325 MG tablet Take 1 - 2 tablets  by mouth every 6 (six) hours as needed for severe pain. 102 tablet 0   pantoprazole (PROTONIX) 40 MG tablet Take 1 tablet (40 mg total) by mouth daily. 30 tablet 3   polyethylene glycol (MIRALAX / GLYCOLAX) 17 g packet Take 17 g by mouth 2 (two) times daily. 60 each 3   prochlorperazine (COMPAZINE) 10 MG tablet Take 1 tablet (10 mg total) by mouth every 6 (six) hours as needed for nausea or vomiting. 30 tablet 0   propranolol (INDERAL) 10 MG tablet Take 1 tablet (10 mg total) by mouth 2 (two) times daily. 180 tablet 3   senna (SENOKOT) 8.6 MG  TABS tablet Take 2 tablets (17.2 mg total) by mouth at bedtime. 60 tablet 3   No current facility-administered medications for this visit.   Facility-Administered Medications Ordered in Other Visits  Medication Dose Route Frequency Provider Last Rate Last Admin   0.9 %  sodium chloride infusion   Intravenous Continuous Causey, Charlestine Massed, NP   Stopped at 06/24/22 1309    VITAL SIGNS: There were no vitals taken for this visit. There were no vitals filed for this visit.   Estimated body mass index is 28.98 kg/m as calculated from the following:   Height as of 08/12/22: '5\' 11"'$  (1.803 m).   Weight as of 08/12/22: 207 lb 12.8 oz (94.3 kg).   PERFORMANCE STATUS (ECOG) : 1 - Symptomatic but completely ambulatory  Assessment NAD RRR Normal breathing pattern  AAO x3  IMPRESSION: Chelsea Hansen presents to clinic today for symptom follow-up. No acute distress. Is trying to remain as active as possible. Is actively working night shifts. Denies nausea, vomiting, constipation, or diarrhea.   Neoplasm related pain Chelsea Hansen reports pain is controlled on current regimen. Some days are better than others however she is able to manage thru. Pain is mainly in her back and shoulder area.    We discussed at length her current regimen.  She is taking Xtampza 9 mg every 12 hours and Percocet every 6 hours as needed for breakthrough pain.  Tolerating medications without difficulyt. Taking as prescribed. Refills have been appropriate timing based on pill count. No adjustments to be made today.  We will continue to closely monitor.   Pain contract on file.  Nausea/Vomiting   Improved with antiemetics.  Constipation Improved with bowel regimen.  PLAN: Continue Xtampza 9 mg every 12 hours.    Continue Percocet every 6 hours as needed for breakthrough pain. Not requiring around the clock.  MiraLAX twice daily for bowel regimen Senna daily Protonix 40 mg daily  Patient reports pain is  well-controlled. I will plan to see patient back in 3-4 weeks in collaboration with her other oncology appointments. Patient knows to contact our office sooner if needed.   Patient expressed understanding and was in agreement with this plan. She also understands that She can call the clinic at any time with any questions, concerns, or complaints.       Any controlled substances utilized were prescribed in the context of palliative care. PDMP has been reviewed.    Time Total: 20 min   Visit consisted of counseling and education dealing with the complex and emotionally intense issues of symptom management and palliative care in the setting of serious and potentially life-threatening illness.Greater than 50%  of this time was spent counseling and coordinating care related to the above assessment and plan.  Alda Lea, AGPCNP-BC  Palliative Medicine Team/[facility]

## 2022-08-31 ENCOUNTER — Encounter: Payer: Self-pay | Admitting: Adult Health

## 2022-08-31 ENCOUNTER — Other Ambulatory Visit: Payer: Self-pay

## 2022-08-31 ENCOUNTER — Inpatient Hospital Stay: Payer: Medicaid Other | Attending: Adult Health

## 2022-08-31 ENCOUNTER — Other Ambulatory Visit (HOSPITAL_COMMUNITY): Payer: Self-pay

## 2022-08-31 ENCOUNTER — Encounter: Payer: Self-pay | Admitting: Nurse Practitioner

## 2022-08-31 ENCOUNTER — Inpatient Hospital Stay (HOSPITAL_BASED_OUTPATIENT_CLINIC_OR_DEPARTMENT_OTHER): Payer: Medicaid Other | Admitting: Adult Health

## 2022-08-31 ENCOUNTER — Inpatient Hospital Stay: Payer: Medicaid Other

## 2022-08-31 ENCOUNTER — Inpatient Hospital Stay (HOSPITAL_BASED_OUTPATIENT_CLINIC_OR_DEPARTMENT_OTHER): Payer: Medicaid Other | Admitting: Nurse Practitioner

## 2022-08-31 ENCOUNTER — Ambulatory Visit: Payer: Medicaid Other

## 2022-08-31 VITALS — BP 136/88 | HR 68 | Temp 97.7°F | Resp 18 | Ht 71.0 in | Wt 204.7 lb

## 2022-08-31 DIAGNOSIS — G893 Neoplasm related pain (acute) (chronic): Secondary | ICD-10-CM | POA: Diagnosis not present

## 2022-08-31 DIAGNOSIS — Z515 Encounter for palliative care: Secondary | ICD-10-CM

## 2022-08-31 DIAGNOSIS — Z79811 Long term (current) use of aromatase inhibitors: Secondary | ICD-10-CM | POA: Insufficient documentation

## 2022-08-31 DIAGNOSIS — C50411 Malignant neoplasm of upper-outer quadrant of right female breast: Secondary | ICD-10-CM

## 2022-08-31 DIAGNOSIS — R209 Unspecified disturbances of skin sensation: Secondary | ICD-10-CM

## 2022-08-31 DIAGNOSIS — R530 Neoplastic (malignant) related fatigue: Secondary | ICD-10-CM

## 2022-08-31 DIAGNOSIS — C50412 Malignant neoplasm of upper-outer quadrant of left female breast: Secondary | ICD-10-CM | POA: Diagnosis not present

## 2022-08-31 DIAGNOSIS — Z170 Estrogen receptor positive status [ER+]: Secondary | ICD-10-CM

## 2022-08-31 DIAGNOSIS — C7951 Secondary malignant neoplasm of bone: Secondary | ICD-10-CM | POA: Insufficient documentation

## 2022-08-31 DIAGNOSIS — Z95828 Presence of other vascular implants and grafts: Secondary | ICD-10-CM

## 2022-08-31 DIAGNOSIS — I890 Lymphedema, not elsewhere classified: Secondary | ICD-10-CM

## 2022-08-31 LAB — CMP (CANCER CENTER ONLY)
ALT: 13 U/L (ref 0–44)
AST: 14 U/L — ABNORMAL LOW (ref 15–41)
Albumin: 4.1 g/dL (ref 3.5–5.0)
Alkaline Phosphatase: 63 U/L (ref 38–126)
Anion gap: 6 (ref 5–15)
BUN: 12 mg/dL (ref 6–20)
CO2: 29 mmol/L (ref 22–32)
Calcium: 8.7 mg/dL — ABNORMAL LOW (ref 8.9–10.3)
Chloride: 106 mmol/L (ref 98–111)
Creatinine: 0.93 mg/dL (ref 0.44–1.00)
GFR, Estimated: >60 mL/min (ref >60)
Glucose, Bld: 96 mg/dL (ref 70–99)
Potassium: 4 mmol/L (ref 3.5–5.1)
Sodium: 141 mmol/L (ref 135–145)
Total Bilirubin: 0.3 mg/dL (ref 0.3–1.2)
Total Protein: 7.5 g/dL (ref 6.5–8.1)

## 2022-08-31 LAB — CBC WITH DIFFERENTIAL (CANCER CENTER ONLY)
Abs Immature Granulocytes: 0 10*3/uL (ref 0.00–0.07)
Basophils Absolute: 0 10*3/uL (ref 0.0–0.1)
Basophils Relative: 1 %
Eosinophils Absolute: 0 10*3/uL (ref 0.0–0.5)
Eosinophils Relative: 0 %
HCT: 32.3 % — ABNORMAL LOW (ref 36.0–46.0)
Hemoglobin: 10.5 g/dL — ABNORMAL LOW (ref 12.0–15.0)
Immature Granulocytes: 0 %
Lymphocytes Relative: 23 %
Lymphs Abs: 0.7 10*3/uL (ref 0.7–4.0)
MCH: 25.1 pg — ABNORMAL LOW (ref 26.0–34.0)
MCHC: 32.5 g/dL (ref 30.0–36.0)
MCV: 77.3 fL — ABNORMAL LOW (ref 80.0–100.0)
Monocytes Absolute: 0.3 10*3/uL (ref 0.1–1.0)
Monocytes Relative: 9 %
Neutro Abs: 2 10*3/uL (ref 1.7–7.7)
Neutrophils Relative %: 67 %
Platelet Count: 226 10*3/uL (ref 150–400)
RBC: 4.18 MIL/uL (ref 3.87–5.11)
RDW: 18.4 % — ABNORMAL HIGH (ref 11.5–15.5)
WBC Count: 2.9 10*3/uL — ABNORMAL LOW (ref 4.0–10.5)
nRBC: 0 % (ref 0.0–0.2)

## 2022-08-31 MED ORDER — OXYCODONE-ACETAMINOPHEN 7.5-325 MG PO TABS
1.0000 | ORAL_TABLET | Freq: Four times a day (QID) | ORAL | 0 refills | Status: DC | PRN
  Filled 2022-09-04: qty 102, 13d supply, fill #0

## 2022-08-31 MED ORDER — OXYCODONE ER 9 MG PO C12A
9.0000 mg | EXTENDED_RELEASE_CAPSULE | Freq: Two times a day (BID) | ORAL | 0 refills | Status: DC
  Filled 2022-08-31: qty 60, 30d supply, fill #0

## 2022-08-31 MED ORDER — DENOSUMAB 120 MG/1.7ML ~~LOC~~ SOLN
120.0000 mg | Freq: Once | SUBCUTANEOUS | Status: AC
  Administered 2022-08-31: 120 mg via SUBCUTANEOUS
  Filled 2022-08-31: qty 1.7

## 2022-08-31 NOTE — Patient Instructions (Signed)
Bone Scan A bone scan is an imaging test that is used to diagnose bone problems. You may have this test to check for: Bone cancer or cancer that has spread to the bone. A broken or cracked (fractured) bone. Bone infection. A cause of bone pain. Certain other bone diseases. During this test, a small amount of a radioactive substance (radiotracer) is injected into a vein in your arm. Your bones will absorb the radiotracer for a short time. The radiotracer gives off energy. A camera that makes images of your bones will capture this energy on images. Abnormal bone changes take up too much or too little of the radiotracer. This will show up in the images. Tell a health care provider about: Any allergies you have, including any previous reactions you have had to radiotracer or contrast material. All medicines you are taking, including vitamins, herbs, eye drops, creams, and over-the-counter medicines. Any bleeding problems you have. Any surgeries you have had. Any medical conditions you have. Whether you are pregnant, may be pregnant, or are breastfeeding. What are the risks? Your health care provider will talk with you about risks. These may include: Exposure to a small amount of radiation, which can slightly increase your risk of cancer. Bleeding at the IV insertion site. Nausea or vomiting caused by the radiotracer. Infection at the IV site. This is rare. Allergic reaction to the radiotracer. Severe reactions may cause a rash or breathing trouble. These reactions are rare. What happens before the procedure? Ask your health care provider about changing or stopping your regular medicines. These include any diabetes medicines or blood thinners you take. Starting 4 days before your scan: Do not take any medicines that contain bismuth. Do not have X-rays that use barium contrast material. Starting 4 hours before the scan, limit how much fluid you drink, as recommended by your health care provider.  At the beginning of the scan, you will need to drink several glasses of water. Do not wear metallic jewelry to the scan. What happens during the procedure?  An IV will be inserted into one of your veins. You will lie down on an exam table. The radiotracer will be injected through your IV. You may feel a cold sensation in your arm as the radiotracer enters your bloodstream. Some images may be taken right after the injection. You will drink 6-8 glasses of water. This flushes the excess tracer out of your system. You may have to wait several hours for your bones to absorb the radiotracer. You will lie back down on the exam table for more images. The camera may move around your body. You may be asked to stay still or to change your position. The procedure may vary among health care providers and hospitals. What happens after the procedure? Your blood pressure, heart rate, breathing rate, and blood oxygen level will be monitored until you leave the hospital or clinic. You will be monitored to make sure that you do not have a reaction to the radioactive tracers. You may have to wait until a specialist (radiologist) checks the images to make sure that they are readable. If the images are not readable, more images may be taken. It is up to you to get the results of your procedure. Ask your health care provider, or the department that is doing the procedure, when your results will be ready. Summary A bone scan is an imaging test that is used to diagnose bone problems. During this test, a small amount of a radioactive  substance (radiotracer) is injected into your blood. The tracer will show up in the images. It is up to you to get the results of your procedure. Ask your health care provider, or the department that is doing the procedure, when your results will be ready. This information is not intended to replace advice given to you by your health care provider. Make sure you discuss any questions you  have with your health care provider. Document Revised: 10/06/2021 Document Reviewed: 10/06/2021 [employer] Patient Education  [place].

## 2022-08-31 NOTE — Progress Notes (Signed)
[facility] Follow up:    Chelsea Perna, NP [id number] [id number]   DIAGNOSIS:  Cancer Staging  Malignant neoplasm of upper-outer quadrant of left breast in female, estrogen receptor positive ([place]) Staging form: Breast, AJCC 8th Edition - Clinical stage from 12/17/2020: Stage IA (cT1b, cN0, cM0, G2, ER+, PR+, HER2-) - Signed by [name], NP on 01/14/2022 Stage prefix: Initial diagnosis Histologic grading system: 3 grade system Laterality: Left Staged by: Pathologist and managing physician Stage used in treatment planning: Yes National guidelines used in treatment planning: Yes Type of national guideline used in treatment planning: NCCN - Pathologic stage from 07/09/2021: No Stage Recommended (ypTis (DCIS), pN0, cM0) - Signed by [name], NP on 07/22/2021 Stage prefix: Post-therapy  Malignant neoplasm of upper-outer quadrant of right breast in female, estrogen receptor positive ([place]) Staging form: Breast, AJCC 8th Edition - Clinical stage from 12/17/2020: Stage IIA (cT2, cN1, cM0, G2, ER+, PR+, HER2-) - Signed by [name], NP on 01/14/2022 Stage prefix: Initial diagnosis Histologic grading system: 3 grade system Laterality: Right Staged by: Pathologist and managing physician Stage used in treatment planning: Yes National guidelines used in treatment planning: Yes Type of national guideline used in treatment planning: NCCN - Pathologic stage from 07/09/2021: No Stage Recommended (ypT2, pN1a, cM0, G2) - Signed by [name], NP on 07/22/2021 Stage prefix: Post-therapy Histologic grading system: 3 grade system   SUMMARY OF ONCOLOGIC HISTORY: Oncology History  Malignant neoplasm of upper-outer quadrant of right breast in female, estrogen receptor positive ([place])  12/12/2020 Initial Diagnosis   status post bilateral breast biopsies 12/03/2020, showing             (1) on the right, a  clinical T2 N1, stage IIa invasive lobular carcinoma, grade 2, estrogen and progesterone receptor strongly positive, HER2 not amplified, with an MIB-1 of 40%                         (a) the biopsied right axillary lymph node was positive with extracapsular extension                         (b) a second right breast mass also biopsied was a fibroadenoma, concordant  MAMMAPRINT tested on biopsy returned high risk, luminal type B, indicating significant benefit from chemo                         (c) biopsy of an area of non-mass-like enhancement in the upper right breast pending   12/17/2020 Cancer Staging   Staging form: Breast, AJCC 8th Edition - Clinical stage from 12/17/2020: Stage IIA (cT2, cN1, cM0, G2, ER+, PR+, HER2-) - Signed by [name], NP on 01/14/2022 Stage prefix: Initial diagnosis Histologic grading system: 3 grade system Laterality: Right Staged by: Pathologist and managing physician Stage used in treatment planning: Yes National guidelines used in treatment planning: Yes Type of national guideline used in treatment planning: NCCN   12/24/2020 Genetic Testing   Negative genetic testing:  No pathogenic variants detected on the [employer] BRCAplus panel (report date 12/24/2020) or the CancerNext-Expanded + RNAinsight panel (report date 12/31/2020). 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   The BRCAplus panel offered by [employer] and includes sequencing and deletion/duplication analysis for the following 8 genes: ATM, BRCA1, BRCA2, CDH1, CHEK2,  PALB2, PTEN, and TP53. The CancerNext-Expanded + RNAinsight gene panel offered by [employer] and includes sequencing and rearrangement analysis for the following 77 genes: AIP, ALK, APC, ATM, AXIN2, BAP1, BARD1, BLM, BMPR1A, BRCA1, BRCA2, BRIP1, CDC73, CDH1, CDK4, CDKN1B, CDKN2A, CHEK2, CTNNA1, DICER1, FANCC, FH, FLCN, GALNT12, KIF1B, LZTR1, MAX, MEN1, MET, MLH1, MSH2, MSH3,  MSH6, MUTYH, NBN, NF1, NF2, NTHL1, PALB2, PHOX2B, PMS2, POT1, PRKAR1A, PTCH1, PTEN, RAD51C, RAD51D, RB1, RECQL, RET, SDHA, SDHAF2, SDHB, SDHC, SDHD, SMAD4, SMARCA4, SMARCB1, SMARCE1, STK11, SUFU, TMEM127, TP53, TSC1, TSC2, VHL and XRCC2 (sequencing and deletion/duplication); EGFR, EGLN1, HOXB13, KIT, MITF, PDGFRA, POLD1 and POLE (sequencing only); EPCAM and GREM1 (deletion/duplication only). RNA data is routinely analyzed for use in variant interpretation for all genes.   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 Multifocal sclerotic bony metastatic disease greatest in the thoracic spine with scattered rib lesions and sclerosis as well. 2. No nodal or visceral metastasis about the chest. 3. Collateral pathways about the chest suggest some subclavian venous narrowing on the LEFT. This could also be related to arm position. Correlate clinically. 4. Signs of RIGHT breast lumpectomy. Potential postoperative changes in the LEFT breast with small focal area of  nodularity in the lateral LEFT breast which could also be postoperative, correlate clinically and consider mammographic correlation as warranted. 5. Mild cardiac enlargement.   05/18/2022 Treatment Plan Change   Anastrozole + Verzenio '100mg'$  PO BID   Malignant neoplasm of upper-outer quadrant of left breast in female, estrogen receptor positive ([place])  12/12/2020 Initial Diagnosis   on the left, a clinical T1b N0, stage IA invasive ductal carcinoma, grade 1 or 2, estrogen and progesterone receptor positive, HER2 not amplified, with an MIB 1 of 15%   12/17/2020 Cancer Staging   Staging form: Breast, AJCC 8th Edition - Clinical stage from 12/17/2020: Stage IA (cT1b, cN0, cM0, G2, ER+, PR+, HER2-) - Signed by [name], NP on 01/14/2022 Stage prefix: Initial diagnosis Histologic grading system: 3 grade system Laterality: Left Staged by: Pathologist and managing physician Stage used in treatment planning: Yes National guidelines used in treatment planning: Yes Type of national guideline used in treatment planning: NCCN   12/24/2020 Genetic Testing   Negative genetic testing:  No pathogenic variants detected on the [facility] BRCAplus panel (report date 12/24/2020) or the CancerNext-Expanded + RNAinsight panel (report date 12/31/2020). 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   The BRCAplus panel offered by [employer] and includes sequencing and deletion/duplication analysis for the following 8 genes: ATM, BRCA1, BRCA2, CDH1,  CHEK2, PALB2, PTEN, and TP53. The CancerNext-Expanded + RNAinsight gene panel offered by [employer] and includes sequencing and rearrangement analysis for the following 77 genes: AIP, ALK, APC, ATM, AXIN2, BAP1, BARD1, BLM, BMPR1A, BRCA1, BRCA2, BRIP1, CDC73, CDH1, CDK4, CDKN1B, CDKN2A, CHEK2, CTNNA1, DICER1, FANCC, FH, FLCN, GALNT12, KIF1B, LZTR1, MAX, MEN1, MET, MLH1, MSH2, MSH3, MSH6, MUTYH, NBN, NF1, NF2,  NTHL1, PALB2, PHOX2B, PMS2, POT1, PRKAR1A, PTCH1, PTEN, RAD51C, RAD51D, RB1, RECQL, RET, SDHA, SDHAF2, SDHB, SDHC, SDHD, SMAD4, SMARCA4, SMARCB1, SMARCE1, STK11, SUFU, TMEM127, TP53, TSC1, TSC2, VHL and XRCC2 (sequencing and deletion/duplication); EGFR, EGLN1, HOXB13, KIT, MITF, PDGFRA, POLD1 and POLE (sequencing only); EPCAM and GREM1 (deletion/duplication only). RNA data is routinely analyzed for use in variant interpretation for all genes.   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 Multifocal sclerotic bony metastatic disease greatest in the thoracic spine with scattered rib lesions and sclerosis as well. 2. No nodal or visceral metastasis about the chest. 3. Collateral pathways about the chest suggest some subclavian venous narrowing on the LEFT. This could also be related to arm position. Correlate clinically. 4. Signs of RIGHT breast lumpectomy. Potential postoperative changes in the LEFT breast with small focal area of nodularity in the lateral LEFT breast which could also be  postoperative, correlate clinically and consider mammographic correlation as warranted. 5. Mild cardiac enlargement.   05/18/2022 Treatment Plan Change   Anastrozole + Verzenio '100mg'$  PO BID   06/23/2022 - 07/07/2022 Radiation Therapy   06/23/2022 through 07/07/2022 Site Technique Total Dose (Gy) Dose per Fx (Gy) Completed Fx Beam Energies  Lumbar Spine: Spine_LS_Pelv 3D 30/30 3 10/10 15X      CURRENT THERAPY: Anastrozole/Verzenio  INTERVAL HISTORY: Chelsea Hansen 52 y.o. female returns for follow-up of her history of metastatic breast cancer.  She continues on anastrozole and Verzenio.  She is tolerating her treatment well and experiences fatigue but otherwise is not experiencing any other significant side effects.  She had a hospitalization January 8 through January 10 due to nausea and vomiting x 2 weeks followed by hematemesis.  GI was consulted and she was placed on Protonix daily as the hematemesis was likely caused a slight Mallory-Weiss tear.  She sees [place] our palliative care nurse practitioner who is helping with managing her pain.  Chelsea Hansen underwent lumbar spine radiation from January 3 2 through July 07, 2022.  She is taking Xtampza every 12 hours scheduled and Percocet every 6 hours for breakthrough pain.  She is taking MiraLAX twice a day for her bowel regimen in addition to senna.  Chelsea Hansen underwent restaging with a CT chest abdomen pelvis on  August 23, 2022 that demonstrated no acute intrathoracic abdominal or pelvic pathology, similar appearance of osseous metastatic disease and L3 pathologic compression fracture, colonic diverticulosis no bowel obstruction and a normal appendix.  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    GERD (gastroesophageal reflux disease) 05/27/2021   Hyperlipidemia 05/27/2021   Hypertension 05/27/2021   Pain management contract signed 05/04/2021   Chronic pain syndrome (breast cancer) 05/04/2021   Palpitations 03/25/2021   Sinus bradycardia 03/25/2021   Daytime somnolence 03/25/2021   Snoring 03/25/2021   Obesity (BMI 30-39.9) 03/25/2021   Port-A-Cath in place 01/27/2021   Genetic testing 12/24/2020   Family history of uterine cancer 12/17/2020   Malignant neoplasm of upper-outer quadrant of right breast in female, estrogen receptor positive ([place]) 12/12/2020   Malignant neoplasm of upper-outer quadrant of left breast in female, estrogen receptor positive ([place]) 12/12/2020   Hypokalemia 01/02/2012   Asthma 01/01/2012   Bradycardia 01/01/2012   Syncope 12/31/2011   Mediastinal adenopathy 12/31/2011   Anemia 12/31/2011   Chest pain 12/31/2011    has No Known Allergies.  MEDICAL HISTORY: Past Medical History:  Diagnosis Date   Allergy    seasonal allergies   Anxiety    on meds   Asthma    uses inhaler   Breast cancer ([place]) 2012   RIGHT lumpectomy   Cancer ([place]) 2022   RIGHT breast lump-dx 2022   Depression    on meds   DVT (deep venous thrombosis) ([place]) 2010   after hysterectomy   Family history of uterine cancer    GERD (gastroesophageal reflux disease)    with certain foods/OTC PRN meds   Headache(784.0)    History of radiation therapy    Bilateral breast- 09/03/21-11/02/21- Dr. [name]   Hyperlipidemia    on meds   Hypertension    on meds   SVT (supraventricular tachycardia)     SURGICAL HISTORY: Past Surgical History:  Procedure Laterality Date   ABDOMINAL HYSTERECTOMY     AXILLARY SENTINEL NODE BIOPSY Left 07/09/2021   Procedure: LEFT AXILLARY SENTINEL NODE BIOPSY;  Surgeon: [name], MD;  Location: [facility];  Service: General;  Laterality: Left;   BIOPSY  06/29/2022   Procedure: BIOPSY;  Surgeon: Doran Stabler, MD;   Location: [facility];  Service: Gastroenterology;;   BREAST EXCISIONAL BIOPSY Right 09/16/2009   BREAST LUMPECTOMY WITH RADIOACTIVE SEED LOCALIZATION Bilateral 07/09/2021   Procedure: BILATERAL BREAST LUMPECTOMY WITH RADIOACTIVE SEED LOCALIZATION;  Surgeon: [name], MD;  Location: [facility];  Service: General;  Laterality: Bilateral;   BREAST SURGERY     lumpectomy   ESOPHAGOGASTRODUODENOSCOPY (EGD) WITH PROPOFOL N/A 06/29/2022   Procedure: ESOPHAGOGASTRODUODENOSCOPY (EGD) WITH PROPOFOL;  Surgeon: Doran Stabler, MD;  Location: [facility];  Service: Gastroenterology;  Laterality: N/A;   PORT-A-CATH REMOVAL Left 07/09/2021   Procedure: REMOVAL PORT-A-CATH;  Surgeon: [name], MD;  Location: [facility];  Service: General;  Laterality: Left;   PORTACATH PLACEMENT Left 01/26/2021   Procedure: INSERTION PORT-A-CATH;  Surgeon: [name], MD;  Location: [facility];  Service: General;  Laterality: Left;   RADIOACTIVE SEED GUIDED AXILLARY SENTINEL LYMPH NODE Right 07/09/2021   Procedure: RADIOACTIVE SEED GUIDED RIGHT AXILLARY SENTINEL LYMPH NODE DISSECTION;  Surgeon: [name], MD;  Location: [facility];  Service: General;  Laterality: Right;   TUBAL LIGATION      SOCIAL HISTORY: Social History   Socioeconomic History   Marital status: Single    Spouse name: Not on file   Number of children: Not on file   Years of education: Not on file   Highest education level: Not on file  Occupational History   Not on file  Tobacco Use   Smoking status: Never   Smokeless tobacco: Never  Vaping Use   Vaping Use: Never used  Substance and Sexual Activity   Alcohol use: Not Currently    Alcohol/week: 7.0 standard drinks of alcohol    Types: 7 Standard drinks or equivalent per week    Comment: occassional   Drug use: No   Sexual activity: Not Currently  Other Topics Concern   Not on file  Social History Narrative   Not on  file   Social Determinants of Health   Financial Resource Strain: High Risk (01/14/2022)   Overall Financial Resource Strain (CARDIA)    Difficulty of Paying Living Expenses: Very hard  Food Insecurity: Food Insecurity Present (01/14/2022)   Hunger Vital Sign    Worried About Running Out of Food in the Last Year: Sometimes true    Ran Out of Food in the Last Year: Sometimes true  Transportation Needs: No Transportation Needs (12/02/2020)   PRAPARE - Hydrologist (Medical): No    Lack of Transportation (Non-Medical): No  Physical Activity: Not on file  Stress: Not on file  Social Connections: Not on file  Intimate Partner Violence: Not on file    FAMILY HISTORY: Family History  Problem Relation Age of Onset   Breast cancer Mother        39s   Hypertension Father    Uterine cancer Maternal Aunt    Ovarian cancer Maternal Aunt    Breast cancer Maternal Aunt        20s   Breast cancer Cousin 9   Uterine cancer Cousin 16   Colon polyps Neg Hx    Colon cancer Neg Hx    Esophageal cancer Neg Hx    Stomach cancer Neg Hx     Review of Systems  Constitutional:  Positive for fatigue. Negative for appetite change, chills, fever and unexpected weight change.  HENT:   Negative for hearing loss, lump/mass and trouble swallowing.   Eyes:  Negative for eye problems and icterus.  Respiratory:  Negative for chest tightness, cough and shortness of breath.   Cardiovascular:  Negative for chest pain, leg swelling and palpitations.  Gastrointestinal:  Negative for abdominal distention, abdominal pain, constipation, diarrhea, nausea and vomiting.  Endocrine: Negative for hot flashes.  Genitourinary:  Negative for difficulty urinating.   Musculoskeletal:  Negative for arthralgias.  Skin:  Negative for itching and rash.  Neurological:  Negative for dizziness, extremity weakness, headaches and numbness.  Hematological:  Negative for adenopathy. Does not bruise/bleed  easily.  Psychiatric/Behavioral:  Negative for depression. The patient is not nervous/anxious.       PHYSICAL EXAMINATION  ECOG PERFORMANCE STATUS: 1 - Symptomatic but completely ambulatory  Vitals:   08/31/22 1236  BP: 136/88  Pulse: 68  Resp: 18  Temp: 97.7 F (36.5 C)  SpO2: 100%    Physical Exam Constitutional:      General: She is not in acute distress.    Appearance: Normal appearance. She is not  toxic-appearing.  HENT:     Head: Normocephalic and atraumatic.  Eyes:     General: No scleral icterus. Cardiovascular:     Rate and Rhythm: Normal rate and regular rhythm.     Pulses: Normal pulses.     Heart sounds: Normal heart sounds.  Pulmonary:     Effort: Pulmonary effort is normal.     Breath sounds: Normal breath sounds.  Abdominal:     General: Abdomen is flat. Bowel sounds are normal. There is no distension.     Palpations: Abdomen is soft.     Tenderness: There is no abdominal tenderness.  Musculoskeletal:        General: No swelling.     Cervical back: Neck supple.  Lymphadenopathy:     Cervical: No cervical adenopathy.  Skin:    General: Skin is warm and dry.     Findings: No rash.  Neurological:     General: No focal deficit present.     Mental Status: She is alert.  Psychiatric:        Mood and Affect: Mood normal.        Behavior: Behavior normal.     LABORATORY DATA:  CBC    Component Value Date/Time   WBC 2.9 (L) 08/31/2022 1153   WBC 2.1 (L) 06/30/2022 1149   RBC 4.18 08/31/2022 1153   HGB 10.5 (L) 08/31/2022 1153   HGB 11.5 10/06/2020 1432   HCT 32.3 (L) 08/31/2022 1153   HCT 36.8 10/06/2020 1432   PLT 226 08/31/2022 1153   PLT 323 10/06/2020 1432   MCV 77.3 (L) 08/31/2022 1153   MCV 73 (L) 10/06/2020 1432   MCH 25.1 (L) 08/31/2022 1153   MCHC 32.5 08/31/2022 1153   RDW 18.4 (H) 08/31/2022 1153   RDW 15.9 (H) 10/06/2020 1432   LYMPHSABS 0.7 08/31/2022 1153   LYMPHSABS 1.9 10/06/2020 1432   MONOABS 0.3 08/31/2022 1153    EOSABS 0.0 08/31/2022 1153   EOSABS 0.1 10/06/2020 1432   BASOSABS 0.0 08/31/2022 1153   BASOSABS 0.0 10/06/2020 1432    CMP     Component Value Date/Time   NA 141 08/31/2022 1153   NA 139 10/06/2020 1432   K 4.0 08/31/2022 1153   CL 106 08/31/2022 1153   CO2 29 08/31/2022 1153   GLUCOSE 96 08/31/2022 1153   BUN 12 08/31/2022 1153   BUN 11 10/06/2020 1432   CREATININE 0.93 08/31/2022 1153   CALCIUM 8.7 (L) 08/31/2022 1153   PROT 7.5 08/31/2022 1153   PROT 6.9 10/06/2020 1432   ALBUMIN 4.1 08/31/2022 1153   ALBUMIN 4.1 10/06/2020 1432   AST 14 (L) 08/31/2022 1153   ALT 13 08/31/2022 1153   ALKPHOS 63 08/31/2022 1153   BILITOT 0.3 08/31/2022 1153   GFRNONAA >60 08/31/2022 1153   GFRAA >60 09/07/2019 1340      ASSESSMENT and THERAPY PLAN:   Malignant neoplasm of upper-outer quadrant of left breast in female, estrogen receptor positive ([place]) Chelsea Hansen is a 52 year old woman with 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  Her most recent restaging scans showed no radiographic signs of breast cancer progression.  I reviewed this with the patient, her mother, and her sons who are all present at the appointment.  We discussed that she has stage IV breast cancer meaning that the breast is the site of origin and that the cancer spread from the breast to the bone.  In reviewing her  restaging scans with her I discussed that there is no sign of breast cancer progression.  We do recommend that she continue her current treatment with anastrozole and Verzenio, along with the Xgeva every 12 weeks.  Because she has stage IV breast cancer we do not know how to cure it.  We may be able to extend the amount of time between scans moving forward however at this point she and her family prefer we restage every 3 months.  I placed orders for repeat CT chest abdomen pelvis along with a bone scan in 12 weeks just prior to her next lab, follow-up  with Dr. Lindi Adie, and injection.  We also discussed Chelsea Hansen's labs today and her calcium level is slightly decreased at 8.6.  She is not taking a calcium supplement and I recommended that she get a calcium supplement over-the-counter and take 1 tablet a day moving forward.  I also recommended that she take Tums 2 tablets twice a day on the day of her Xgeva injection.  She verbalized understanding of this and I wrote these directions out for her.  We will see her back in 12 weeks for labs, follow-up, and her next injection she will continue to follow with [facility] and palliative care for her metastatic cancer pain management.    All questions were answered. The patient knows to call the clinic with any problems, questions or concerns. We can certainly see the patient much sooner if necessary.  Total encounter time:30 minutes*in face-to-face visit time, chart review, lab review, care coordination, order entry, and documentation of the encounter time.    Wilber Bihari, NP 08/31/22 1:45 PM Medical Oncology and Hematology [facility] [place], [place] [address] Tel. [phone]    Fax. [phone]  *Total Encounter Time as defined by the Centers for Medicare and Medicaid Services includes, in addition to the face-to-face time of a patient visit (documented in the note above) non-face-to-face time: obtaining and reviewing outside history, ordering and reviewing medications, tests or procedures, care coordination (communications with other health care professionals or caregivers) and documentation in the medical record.

## 2022-08-31 NOTE — Therapy (Signed)
OUTPATIENT PHYSICAL THERAPY ONCOLOGY EVALUATION  Patient Name: Chelsea Hansen MRN: [id number] DOB:14-Jun-1971, 52 y.o., female Today's Date: 08/31/2022  END OF SESSION:  PT End of Session - 08/31/22 0807     Visit Number 12    Number of Visits 16    Date for PT Re-Evaluation 09/09/22    Authorization Type Medicaid    Authorization - Visit Number 6    Authorization - Number of Visits 12    Progress Note Due on Visit 32    PT Start Time 0809    PT Stop Time [id number]    PT Time Calculation (min) 46 min    Activity Tolerance Patient tolerated treatment well    Behavior During Therapy [facility] for tasks assessed/performed             Past Medical History:  Diagnosis Date   Allergy    seasonal allergies   Anxiety    on meds   Asthma    uses inhaler   Breast cancer ([place]) 2012   RIGHT lumpectomy   Cancer ([place]) 2022   RIGHT breast lump-dx 2022   Depression    on meds   DVT (deep venous thrombosis) ([place]) 2010   after hysterectomy   Family history of uterine cancer    GERD (gastroesophageal reflux disease)    with certain foods/OTC PRN meds   Headache(784.0)    History of radiation therapy    Bilateral breast- 09/03/21-11/02/21- Dr. [name]   Hyperlipidemia    on meds   Hypertension    on meds   SVT (supraventricular tachycardia)    Past Surgical History:  Procedure Laterality Date   ABDOMINAL HYSTERECTOMY     AXILLARY SENTINEL NODE BIOPSY Left 07/09/2021   Procedure: LEFT AXILLARY SENTINEL NODE BIOPSY;  Surgeon: [name], MD;  Location: [place];  Service: General;  Laterality: Left;   BIOPSY  06/29/2022   Procedure: BIOPSY;  Surgeon: Doran Stabler, MD;  Location: [facility];  Service: Gastroenterology;;   BREAST EXCISIONAL BIOPSY Right 09/16/2009   BREAST LUMPECTOMY WITH RADIOACTIVE SEED LOCALIZATION Bilateral 07/09/2021   Procedure: BILATERAL BREAST LUMPECTOMY WITH RADIOACTIVE SEED LOCALIZATION;  Surgeon: [name], MD;   Location: [place];  Service: General;  Laterality: Bilateral;   BREAST SURGERY     lumpectomy   ESOPHAGOGASTRODUODENOSCOPY (EGD) WITH PROPOFOL N/A 06/29/2022   Procedure: ESOPHAGOGASTRODUODENOSCOPY (EGD) WITH PROPOFOL;  Surgeon: Doran Stabler, MD;  [facility];  Service: Gastroenterology;  Laterality: N/A;   PORT-A-CATH REMOVAL Left 07/09/2021   Procedure: REMOVAL PORT-A-CATH;  Surgeon: [name], MD;  Location: [place];  Service: General;  Laterality: Left;   PORTACATH PLACEMENT Left 01/26/2021   Procedure: INSERTION PORT-A-CATH;  Surgeon: [name], MD;  Location: [facility];  Service: General;  Laterality: Left;   RADIOACTIVE SEED GUIDED AXILLARY SENTINEL LYMPH NODE Right 07/09/2021   Procedure: RADIOACTIVE SEED GUIDED RIGHT AXILLARY SENTINEL LYMPH NODE DISSECTION;  Surgeon: [name], MD;  Location: [place];  Service: General;  Laterality: Right;   TUBAL LIGATION     Patient Active Problem List   Diagnosis Date Noted   Chronic constipation 06/30/2022   Nausea and vomiting in adult 06/29/2022   Hematemesis 06/28/2022   Metastasis to bone ([place]) 05/20/2022   PSVT (paroxysmal supraventricular tachycardia) 07/08/2021   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   GERD (gastroesophageal reflux disease) 05/27/2021   Hyperlipidemia 05/27/2021   Hypertension 05/27/2021   Pain management  contract signed 05/04/2021   Chronic pain syndrome (breast cancer) 05/04/2021   Palpitations 03/25/2021   Sinus bradycardia 03/25/2021   Daytime somnolence 03/25/2021   Snoring 03/25/2021   Obesity (BMI 30-39.9) 03/25/2021   Port-A-Cath in place 01/27/2021   Genetic testing 12/24/2020   Family history of uterine cancer 12/17/2020   Malignant neoplasm of upper-outer quadrant of right breast in female, estrogen receptor positive ([place]) 12/12/2020   Malignant neoplasm of upper-outer quadrant of left breast in female,  estrogen receptor positive ([place]) 12/12/2020   Hypokalemia 01/02/2012   Asthma 01/01/2012   Bradycardia 01/01/2012   Syncope 12/31/2011   Mediastinal adenopathy 12/31/2011   Anemia 12/31/2011   Chest pain 12/31/2011    PCP: Juluis Mire  REFERRING PROVIDER: [name]  REFERRING DIAG: Malignant neoplasm of upper-outer quadrant of right breast in female, estrogen receptor positive ([place])  Malignant neoplasm of upper-outer quadrant of left breast in female, estrogen receptor positive ([place])  Lymphedema, not elsewhere classified  Metastasis to bone ([place])  Neoplasm related pain (acute) (chronic)  THERAPY DIAG:  Malignant neoplasm of upper-outer quadrant of right breast in female, estrogen receptor positive ([place])  Malignant neoplasm of upper-outer quadrant of left breast in female, estrogen receptor positive ([place])  Lymphedema, not elsewhere classified  Metastasis to bone ([place])  Neoplasm related pain (acute) (chronic)  Disturbance of skin sensation  ONSET DATE: 01/2021  Rationale for Evaluation and Treatment: Rehabilitation  SUBJECTIVE:                                                                                                                                                                                           SUBJECTIVE STATEMENT:  I have appts all day today. I am really tired now. My pain has been off the charts. I worked 20 hours one day. The driving to [place] really bothered me.    PERTINENT HISTORY:  Neoadjuvant chemotherapy with Doxorubicin and Cyclophosphamide given x 4 beginning 01/27/2021 and completing on 03/10/2021 followed by weekly paclitaxel x 12 beginning 05/2021 07/09/2021: Right lumpectomy: Grade 2 invasive lobular cancer, 2.5 cm, LCIS, margins negative, LCIS focally at anterior margin, 1/1 lymph node positive, ER 95%, PR 100%, HER2 negative, Ki-67 40% Left lumpectomy: High-grade DCIS: 0.7 cm, margins negative, 0/1 lymph node negative ER 95%, PR 100%.  Pt now  STAGE IV Breast Cancer with bone mets throughout thoracic and lumbar regions and is pending radiation to her lumbar spine next week.    PAIN:  Are you having pain? Yes NPRS scale: 6/10 today in back,hips Pain location: LB Pain orientation: Bilateral  PAIN TYPE: aching, sharp,  Pain description:intermittent Aggravating factors: standing, sleeping,  walking, stairs, housework Relieving factors: Pain meds,changing positions,  PRECAUTIONS: BONE METS currently s/p palliative radiation to spine, bilateral breast lymphedema, bilateral UE lymphedema risk  WEIGHT BEARING RESTRICTIONS: No  FALLS:  Has patient fallen in last 6 months? No  LIVING ENVIRONMENT: Lives with: currently living alone Lives [id number] presently  Has following equipment : Single point cane, Walker - 4 wheeled, shower chair, and bed side commode  OCCUPATION: mgr at [employer]  LEISURE: shop, dominoes  HAND DOMINANCE: right   PRIOR LEVEL OF FUNCTION: Independent  PATIENT GOALS:   Get back to life, keep breast swelling down, learn exercises to strengthen my back/core,keep shoulder ROM on the right track   OBJECTIVE:  COGNITION: Overall cognitive status: Within functional limits for tasks assessed   PALPATION:   OBSERVATIONS / OTHER ASSESSMENTS: bilateral breasts with enlarged pores but improved since prior treatment session  SENSATION: Light touch: Appears intact   POSTURE: forward head, rounded shoulders  UPPER EXTREMITY AROM/PROM:  A/PROM RIGHT   eval   Shoulder extension 55  Shoulder flexion 157  Shoulder abduction 174  Shoulder internal rotation 65  Shoulder external rotation 98    (Blank rows = not tested)  A/PROM LEFT   eval  Shoulder extension 56  Shoulder flexion 156  Shoulder abduction 172  Shoulder internal rotation 65  Shoulder external rotation 95    (Blank rows = not tested)  CERVICAL AROM: All within functional limits:    LUMBAR ROM: Lumbar ROM without increasing  pain; Bilateral SB decreased 25%, FB decreased 90%, BB decreased 90% all with pain  ANKLE  AROM DF : right -12,  LEFT  -8,   , Jul 22, 2022 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  HS Length: 30 deg Bilaterally, 07/22/2022  Left   35    Degrees           Right 45 Degrees  UPPER EXTREMITY STRENGTH: WFL  LOWER EXTREMITY MMT: WFL     LYMPHEDEMA ASSESSMENTS:   SURGERY TYPE/DATE: 07/09/2021 BILATERAL BREAST LUMPECTOMY WITH RADIOACTIVE SEED LOCALIZATION RADIOACTIVE SEED GUIDED RIGHT AXILLARY TARGETED LYMPH NODE DISSECTION LEFT AXILLARY SENTINEL NODE BIOPSY LEFT AXILLARY LYMPH NODE MAPPING WITH SENTIMAG    NUMBER OF LYMPH NODES REMOVED: Right 1/1, Left 0/1   CHEMOTHERAPY:  neoadjuvant    RADIATION:yes bilateral breast, recently finished palliative radiation for bone METS to spine    HORMONE TREATMENT: YES  INFECTIONS: NO  LYMPHEDEMA ASSESSMENTS:     LANDMARK RIGHT  03/04/2022 Right   10 cm proximal to olecranon process 30.5   Olecranon process 27.1   10 cm proximal to ulnar styloid process 21.2   Just proximal to ulnar styloid process 16.6   Across hand at thumb web space 21.4   At base of 2nd digit 6.8   (Blank rows = not tested)   LANDMARK LEFT  03/04/2022 LEFT  10 cm proximal to olecranon process 30.7   Olecranon process 26.7   10 cm proximal to ulnar styloid process 20.5   Just proximal to ulnar styloid process 16.3   Across hand at thumb web space 21.1   At base of 2nd digit 6.7   (Blank rows = not tested)      GAIT: Distance walked: 20 feet Assistive device utilized: None Level of assistance: Complete Independence Comments: decreased heel strike, toe off, sideways translation with some LOB  QUICK DASH SURVEY: not completed   TODAY'S TREATMENT:  08/31/2022 Nustep seat 10, UE 10, Lev 3 x 6 min Incline stretch 3 x 30  sec Heel raises on ax x 15, marching on ax x 15, Marching with alt. UE raises x 10 Side stepping down and back 3 lengths ea Vector reaches x 7 ea  with toe touch, 5 ea no touch. SLS right 4,5, 30 sec, Left 12,12, 22 sec Mini squats x 10 in bars Lunges x 10 ea with intermittent hand hold HS stretch with strap 3 x 30 sec  FABER stretch supine x 3 ea  08/24/2022 Pt tried ppt but had difficulty activating Therapist demonstrated ppt on table and then pt reproduced x 10 Ppt with march 2 x 5 Ppt with alternate arms x 10 Figure 4 stretch x 3 ea 30 sec Ppt with alternate hand to knee 2x 10 Ppt with bolster squeeze x 10 SLR x 5 B with ppt SKTC stretch x 3 B x 30 Nustep seat 10, UE 10 lev 4 x 7 min, 258 steps Incline gastroc stretch x 3 x 30 min Heel toe walking length of bars x 2 and retro walking x 2 SLS bilaterally x 3 ea; held 15-20 sec without LOB 08/20/2022 Nu step seat 10, UE 10, resistance level 3 x 7;20  Marching on ax  x 10, heel raises x 15 no HH Step up  and hold on ax forward and sideways x 10 bilaterally Incline stretch x 3, 30sec Supine HS stretch 3 x 30 sec B Figure 4 stretch x 3 ea 30 sec  Alternate hand to knee with TrA x 10 SLR x 5 B with ppt SKTC stretch x 3 B x 30  08/19/2022 Nu step Seat 10, UE 11, seat 3 resistance 3 x 5 Meeks Decompression x5 ea 3 sec hold Hamstring stretch with strap x 3 bilaterally 30 sec hold Gentle lumbar trunk rotation with arms outstretches x 3 ea Pelvic tilts x 10 TrA with alternate arm flexion x 10,  TrA with heel slide x 5, left only held due to left hip pain Bilateral figure 4 stretch x3, SKTC x 3 Encouraged pt to do gentle activities when hurting to prevent stiffness/increased pain 08/12/2022  Nu-Step seat 10, UE 11, resistance 3 x 5 min In bars;march on ax x 10, march with alternate arm raise x 10 Heel raises on ax x 15 no HH Standing feet together 3 x 20 sec eyes closed Step and hold on ax forward and sideways  with same side hip and shoulder flexion x 10  Incline stretch 3 x 30 sec  On Table: Supine figure 4 stretch  TrA with hip abduction and yellow band x 10 Alternate  knee touches with opposite hand with TrA x 10 ea                                                                                                                                  DATE: 08/10/2022 Nu-Step seat 10, UE 11, resistance 3 x 4 min Incline gastroc stretch in bars 3 x  30 sec In bars: step and hold on ax 2 x 5 forwards and sideways no HH Heel raises x 10 on ax, Marching x 10 on ax Tandem walking on ax beam , sidestepping on ax beam 4 lengths TrA and hip adductor bolster squeeze x 10 TrA with march and alternate UE flexion TrA march with alternate knee touch x10 ea side TrA with hip abduction yellow x 15  Bilateral figure 4 stretch x20 sec Updated HEP 08/06/2022 TrA with marching x 10 TrA with march and alternate UE flexion TrA march with alternate knee touch x10 ea side Bridge 2 x 5 Hip adductor ball squeeze x10 HS stretch with strap x 3 x 30 sec Supine horizontal abduction with TrA x 10 Standing Scapular retraction red with TrA x 10 Marching with TrA 2 x 10 Heel raises x 10 Tandem stance bilaterally x 20 Step and hold on ax forward and sideways x 5 B 08/04/2022 Meeks decompression x 5ea to warm up HS stretch with strap supine 3 x 20 sec B,  right leg 45 degrees, left 36 degrees Bilateral gastroc stretch supine with strap 3 x 30 sec,   Supine TrA with march,2x 5, March with opposite arm raises 2 x 10 March with opposite knee touches 2 x 10 TrA with heel slides x 10 ea TrA with hip adductor purple ball squeeze Supine wand flexion and scaption x 5   07/22/2022 Reviewed in and out of bed and hip hinge practicing hinge at counter to simulate brushing teeth Meeks decompression x 5ea to warm up HS stretch with strap supine 3 x 20 sec B,  right leg 45 degrees, left 36 degrees Bilateral gastroc stretch supine with strap 3 x 20 sec,   Supine TrA with march, x 10, March with opposite arm raises 2 x 10 Assessed DF and HS ROM and checked goals 06/25/2022  Evaluation only  PATIENT  EDUCATION:  08/06/2022 Side Pull: Double Arm   On back, knees bent, feet flat. Arms perpendicular to body, shoulder level, elbows straight but relaxed. Pull arms out to sides, elbows straight. Resistance band comes across collarbones, hands toward floor. Hold momentarily. Slowly return to starting position. Repeat _5-10__ times. Band color _yellow____   EDUCATION  08/10/2022 Access code;[email or web address] Bridge, ppt, ppt with march, hip adductor squeze, hip abduction  Access Code: [email or web address] URL: [email or web address] Date: 08/06/2022  Education details: discussed POC/activities that will be performedAccess Code: [email or web address] URL: [email or web address] Date: 07/22/2022 Prepared by: Cheral Almas  Exercises - Supine Hamstring Stretch with Strap  - 1 x daily - 7 x weekly - 3 sets - 10 reps, Meeks decompression x 5 Person educated: Patient Education method: Explanation Education comprehension: verbalized understanding  HOME EXERCISE PROGRAM:  Supine Hamstring Stretch with Strap  - 1 x daily - 7 x weekly - 3 sets - 10 reps, Meeks decompression x 5, supine horizontal abd with TrA activation and red band x 10, standing scapular retraction red x 10 ASSESSMENT:  CLINICAL IMPRESSION:  Pt improving with standing balance activities. Had some difficulty with vector reaches without touching down, and some difficulty with form for mini squats and lunges but overall improving with trying to maintain hip hinge and not bend at LB with activities. Pt was energized after exercising. Pain level remained the same.  OBJECTIVE IMPAIRMENTS: Abnormal gait, decreased activity tolerance, decreased balance, decreased ROM, decreased strength, increased edema, impaired sensation, improper body mechanics, and postural dysfunction.   ACTIVITY LIMITATIONS: lifting, bending, standing, sleeping, stairs, bed mobility, and locomotion  level  PARTICIPATION LIMITATIONS: cleaning, laundry, shopping, and  occupation  PERSONAL FACTORS: 3+ comorbidities: bilateral Breast Cancer s/p Radiation with new bone Metastasis and palliative radiation for pain  are also affecting patient's functional outcome.   REHAB POTENTIAL: Good  CLINICAL DECISION MAKING: Evolving/moderate complexity  EVALUATION COMPLEXITY: Low  GOALS: Goals reviewed with patient? Yes  SHORT TERM GOALS: Target date: 07/22/2022  Pt will be independent in a HEP for gentle lumbar/core neutral spine activities and LE flexibility Baseline: Goal status:MET 07/22/2022  2.  Pt will be able to get in and out of bed and brush teeth with good body mechanics Baseline: no knowledge Goal status:MET for in/out of bed, nearly Met for hip hinge  LONG TERM GOALS: Target date: 2/29/2023  Pt will be approved for Flexi touch Sequential compression pump for management of bilateral breast edema Baseline:  Goal status:MET  2.  Pt will have AROM for bilateral DF improved to atleast 5 degrees for improved gait pattern Baseline:  Goal status:In Progress  3.  Pt will be educated in proper body mechanics for home activities to prevent injury Baseline: no knowledge Goal status:In progress  4.  Pt will ambulate with improved heel strike /toe off and decreased sideways translation for improved energy conservation Baseline:  Goal status:In progress 5. Pt will be independent in and tolerant of higher level core strength/stability to decrease risk of injury  Baseline:no Knowledge  NEW  PLAN:  PT FREQUENCY: 2x/week  PT DURATION:6 weeks  PLANNED INTERVENTIONS: Therapeutic exercises, Therapeutic activity, Neuromuscular re-education, Balance training, Patient/Family education, Self Care, Orthotic/Fit training, Aquatic Therapy, Manual lymph drainage, Manual therapy, and Re-evaluation  PLAN FOR NEXT SESSION: update HEP,check on compression sleeves,Flexi set up in clinic next Tuesday at 9?hip hinge,  cont gastroc and HS stretches,,test balance, gentle  core strength, STM prn for chest/muscular tightness, shoulder ROM prn, update HEP with supine stab exs, add standing theraband exs   Claris Pong, PT 08/31/2022, 8:53 AM

## 2022-08-31 NOTE — Assessment & Plan Note (Signed)
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  Her most recent restaging scans showed no radiographic signs of breast cancer progression.  I reviewed this with the patient, her mother, and her sons who are all present at the appointment.  We discussed that she has stage IV breast cancer meaning that the breast is the site of origin and that the cancer spread from the breast to the bone.  In reviewing her restaging scans with her I discussed that there is no sign of breast cancer progression.  We do recommend that she continue her current treatment with anastrozole and Verzenio, along with the Xgeva every 12 weeks.  Because she has stage IV breast cancer we do not know how to cure it.  We may be able to extend the amount of time between scans moving forward however at this point she and her family prefer we restage every 3 months.  I placed orders for repeat CT chest abdomen pelvis along with a bone scan in 12 weeks just prior to her next lab, follow-up with Dr. Lindi Adie, and injection.  We also discussed Chelsea Hansen's labs today and her calcium level is slightly decreased at 8.6.  She is not taking a calcium supplement and I recommended that she get a calcium supplement over-the-counter and take 1 tablet a day moving forward.  I also recommended that she take Tums 2 tablets twice a day on the day of her Xgeva injection.  She verbalized understanding of this and I wrote these directions out for her.  We will see her back in 12 weeks for labs, follow-up, and her next injection she will continue to follow with [facility] and palliative care for her metastatic cancer pain management.

## 2022-09-01 ENCOUNTER — Telehealth: Payer: Self-pay | Admitting: Adult Health

## 2022-09-01 NOTE — Telephone Encounter (Signed)
Scheduled appointments per 3/12 los. Patient is aware of the made appointments.

## 2022-09-02 ENCOUNTER — Other Ambulatory Visit (HOSPITAL_COMMUNITY): Payer: Self-pay

## 2022-09-02 ENCOUNTER — Ambulatory Visit: Payer: Medicaid Other

## 2022-09-03 ENCOUNTER — Other Ambulatory Visit (HOSPITAL_COMMUNITY): Payer: Self-pay

## 2022-09-04 ENCOUNTER — Other Ambulatory Visit (HOSPITAL_COMMUNITY): Payer: Self-pay

## 2022-09-06 ENCOUNTER — Other Ambulatory Visit (HOSPITAL_COMMUNITY): Payer: Self-pay

## 2022-09-06 ENCOUNTER — Other Ambulatory Visit: Payer: Self-pay | Admitting: Hematology and Oncology

## 2022-09-06 ENCOUNTER — Other Ambulatory Visit: Payer: Self-pay

## 2022-09-06 DIAGNOSIS — C50411 Malignant neoplasm of upper-outer quadrant of right female breast: Secondary | ICD-10-CM

## 2022-09-06 MED ORDER — ABEMACICLIB 50 MG PO TABS
50.0000 mg | ORAL_TABLET | Freq: Two times a day (BID) | ORAL | 3 refills | Status: DC
  Filled 2022-09-06: qty 56, 28d supply, fill #0
  Filled 2022-10-04: qty 56, 28d supply, fill #1
  Filled 2022-11-05 – 2022-11-23 (×2): qty 56, 28d supply, fill #2
  Filled 2022-12-20: qty 56, 28d supply, fill #3

## 2022-09-07 ENCOUNTER — Ambulatory Visit: Payer: Medicaid Other

## 2022-09-07 DIAGNOSIS — C7951 Secondary malignant neoplasm of bone: Secondary | ICD-10-CM

## 2022-09-07 DIAGNOSIS — G893 Neoplasm related pain (acute) (chronic): Secondary | ICD-10-CM

## 2022-09-07 DIAGNOSIS — R209 Unspecified disturbances of skin sensation: Secondary | ICD-10-CM

## 2022-09-07 DIAGNOSIS — I890 Lymphedema, not elsewhere classified: Secondary | ICD-10-CM

## 2022-09-07 DIAGNOSIS — C50411 Malignant neoplasm of upper-outer quadrant of right female breast: Secondary | ICD-10-CM | POA: Diagnosis not present

## 2022-09-07 DIAGNOSIS — Z170 Estrogen receptor positive status [ER+]: Secondary | ICD-10-CM

## 2022-09-07 NOTE — Therapy (Addendum)
OUTPATIENT PHYSICAL THERAPY ONCOLOGY EVALUATION  Patient Name: Chelsea Hansen MRN: [id number] DOB:Mar 06, 1971, 52 y.o., female Today's Date: 09/07/2022  END OF SESSION:  PT End of Session - 09/07/22 1250     Visit Number 13    Number of Visits 25    Date for PT Re-Evaluation 10/19/22    Authorization Type Medicaid    Authorization - Visit Number 9    Authorization - Number of Visits 12    Progress Note Due on Visit 16    PT Start Time [id number]    PT Stop Time 1350    PT Time Calculation (min) 59 min    Activity Tolerance Patient tolerated treatment well    Behavior During Therapy WFL for tasks assessed/performed             Past Medical History:  Diagnosis Date   Allergy    seasonal allergies   Anxiety    on meds   Asthma    uses inhaler   Breast cancer ([place]) 2012   RIGHT lumpectomy   Cancer ([place]) 2022   RIGHT breast lump-dx 2022   Depression    on meds   DVT (deep venous thrombosis) ([place]) 2010   after hysterectomy   Family history of uterine cancer    GERD (gastroesophageal reflux disease)    with certain foods/OTC PRN meds   Headache(784.0)    History of radiation therapy    Bilateral breast- 09/03/21-11/02/21- Dr. [name]   Hyperlipidemia    on meds   Hypertension    on meds   SVT (supraventricular tachycardia)    Past Surgical History:  Procedure Laterality Date   ABDOMINAL HYSTERECTOMY     AXILLARY SENTINEL NODE BIOPSY Left 07/09/2021   Procedure: LEFT AXILLARY SENTINEL NODE BIOPSY;  Surgeon: [name], MD;  Location: [facility];  Service: General;  Laterality: Left;   BIOPSY  06/29/2022   Procedure: BIOPSY;  Surgeon: Doran Stabler, MD;  Location: [facility];  Service: Gastroenterology;;   BREAST EXCISIONAL BIOPSY Right 09/16/2009   BREAST LUMPECTOMY WITH RADIOACTIVE SEED LOCALIZATION Bilateral 07/09/2021   Procedure: BILATERAL BREAST LUMPECTOMY WITH RADIOACTIVE SEED LOCALIZATION;  Surgeon: [name], MD;   Location: [facility];  Service: General;  Laterality: Bilateral;   BREAST SURGERY     lumpectomy   ESOPHAGOGASTRODUODENOSCOPY (EGD) WITH PROPOFOL N/A 06/29/2022   Procedure: ESOPHAGOGASTRODUODENOSCOPY (EGD) WITH PROPOFOL;  Surgeon: Doran Stabler, MD;  Location: [facility];  Service: Gastroenterology;  Laterality: N/A;   PORT-A-CATH REMOVAL Left 07/09/2021   Procedure: REMOVAL PORT-A-CATH;  Surgeon: [name], MD;  Location: [facility];  Service: General;  Laterality: Left;   PORTACATH PLACEMENT Left 01/26/2021   Procedure: INSERTION PORT-A-CATH;  Surgeon: [name], MD;  Location: [facility];  Service: General;  Laterality: Left;   RADIOACTIVE SEED GUIDED AXILLARY SENTINEL LYMPH NODE Right 07/09/2021   Procedure: RADIOACTIVE SEED GUIDED RIGHT AXILLARY SENTINEL LYMPH NODE DISSECTION;  Surgeon: [name], MD;  Location: [facility];  Service: General;  Laterality: Right;   TUBAL LIGATION     Patient Active Problem List   Diagnosis Date Noted   Chronic constipation 06/30/2022   Nausea and vomiting in adult 06/29/2022   Hematemesis 06/28/2022   Metastasis to bone ([place]) 05/20/2022   PSVT (paroxysmal supraventricular tachycardia) 07/08/2021   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   GERD (gastroesophageal reflux disease) 05/27/2021   Hyperlipidemia 05/27/2021   Hypertension 05/27/2021   Pain management  contract signed 05/04/2021   Chronic pain syndrome (breast cancer) 05/04/2021   Palpitations 03/25/2021   Sinus bradycardia 03/25/2021   Daytime somnolence 03/25/2021   Snoring 03/25/2021   Obesity (BMI 30-39.9) 03/25/2021   Port-A-Cath in place 01/27/2021   Genetic testing 12/24/2020   Family history of uterine cancer 12/17/2020   Malignant neoplasm of upper-outer quadrant of right breast in female, estrogen receptor positive ([place]) 12/12/2020   Malignant neoplasm of upper-outer quadrant of left breast in female,  estrogen receptor positive ([place]) 12/12/2020   Hypokalemia 01/02/2012   Asthma 01/01/2012   Bradycardia 01/01/2012   Syncope 12/31/2011   Mediastinal adenopathy 12/31/2011   Anemia 12/31/2011   Chest pain 12/31/2011    PCP: Juluis Mire  REFERRING PROVIDER: [name]  REFERRING DIAG: Malignant neoplasm of upper-outer quadrant of right breast in female, estrogen receptor positive ([place])  Malignant neoplasm of upper-outer quadrant of left breast in female, estrogen receptor positive ([place])  Lymphedema, not elsewhere classified  Metastasis to bone ([place])  Neoplasm related pain (acute) (chronic)  THERAPY DIAG:  Malignant neoplasm of upper-outer quadrant of right breast in female, estrogen receptor positive ([place]) - Plan: PT plan of care cert/re-cert  Malignant neoplasm of upper-outer quadrant of left breast in female, estrogen receptor positive ([place]) - Plan: PT plan of care cert/re-cert  Lymphedema, not elsewhere classified - Plan: PT plan of care cert/re-cert  Metastasis to bone [facility]) - Plan: PT plan of care cert/re-cert  Neoplasm related pain (acute) (chronic) - Plan: PT plan of care cert/re-cert  Disturbance of skin sensation - Plan: PT plan of care cert/re-cert  ONSET DATE: 0/6301  Rationale for Evaluation and Treatment: Rehabilitation  SUBJECTIVE:                                                                                                                                                                                           SUBJECTIVE STATEMENT:  My bones are really hurting today, especially in my back and legs. They said it would take a while for the pain to go away from the radiation. Under my armpit has started to swell again on the right.  I have used the pump 2 times but I don't see a lot of difference. I am doing the exercises 2-3 times per week.  I am still working 2 nights and 2 days a week. It is very hard working night shift and I get very sore and  tired. If I do the exercises it does help to ease my pain. I missed an appt last week because I had the injection for my bones and it puts me down for a  couple of days.    PERTINENT HISTORY:  Neoadjuvant chemotherapy with Doxorubicin and Cyclophosphamide given x 4 beginning 01/27/2021 and completing on 03/10/2021 followed by weekly paclitaxel x 12 beginning 05/2021 07/09/2021: Right lumpectomy: Grade 2 invasive lobular cancer, 2.5 cm, LCIS, margins negative, LCIS focally at anterior margin, 1/1 lymph node positive, ER 95%, PR 100%, HER2 negative, Ki-67 40% Left lumpectomy: High-grade DCIS: 0.7 cm, margins negative, 0/1 lymph node negative ER 95%, PR 100%. Pt now  STAGE IV Breast Cancer with bone mets throughout thoracic and lumbar regions and is pending radiation to her lumbar spine next week.    PAIN:  Are you having pain? Yes NPRS scale: 9/10 today in back,hips Pain location: LB Pain orientation: Bilateral  PAIN TYPE: aching, sharp,  Pain description:intermittent Aggravating factors: standing, sleeping, walking, stairs, housework Relieving factors: Pain meds,changing positions,  PRECAUTIONS: BONE METS currently s/p palliative radiation to spine, bilateral breast lymphedema, bilateral UE lymphedema risk  WEIGHT BEARING RESTRICTIONS: No  FALLS:  Has patient fallen in last 6 months? No  LIVING ENVIRONMENT: Lives with: currently living alone Lives [id number] presently  Has following equipment : Single point cane, Walker - 4 wheeled, shower chair, and bed side commode  OCCUPATION: mgr at [employer]  LEISURE: shop, dominoes  HAND DOMINANCE: right   PRIOR LEVEL OF FUNCTION: Independent  PATIENT GOALS:   Get back to life, keep breast swelling down, learn exercises to strengthen my back/core,keep shoulder ROM on the right track   OBJECTIVE:  COGNITION: Overall cognitive status: Within functional limits for tasks assessed   PALPATION:   OBSERVATIONS / OTHER ASSESSMENTS: bilateral  breasts with enlarged pores but improved since prior treatment session  SENSATION: Light touch: Appears intact   POSTURE: forward head, rounded shoulders  UPPER EXTREMITY AROM/PROM:  A/PROM RIGHT   eval   Shoulder extension 55  Shoulder flexion 157  Shoulder abduction 174  Shoulder internal rotation 65  Shoulder external rotation 98    (Blank rows = not tested)  A/PROM LEFT   eval  Shoulder extension 56  Shoulder flexion 156  Shoulder abduction 172  Shoulder internal rotation 65  Shoulder external rotation 95    (Blank rows = not tested)  CERVICAL AROM: All within functional limits:    LUMBAR ROM: Lumbar ROM without increasing pain; Bilateral SB decreased 25%, FB decreased 90%, BB decreased 90% all with pain  ANKLE  AROM DF : right -12,  LEFT  -8,   , Jul 22, 2022 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,  09/07/2022  Right  0             Left  2  HS Length: 30 deg Bilaterally, 07/22/2022  Left   35    Degrees     Right 45 Degrees, 09/07/2022   left 50          Right: 60  UPPER EXTREMITY STRENGTH: WFL  LOWER EXTREMITY MMT: WFL     LYMPHEDEMA ASSESSMENTS:   SURGERY TYPE/DATE: 07/09/2021 BILATERAL BREAST LUMPECTOMY WITH RADIOACTIVE SEED LOCALIZATION RADIOACTIVE SEED GUIDED RIGHT AXILLARY TARGETED LYMPH NODE DISSECTION LEFT AXILLARY SENTINEL NODE BIOPSY LEFT AXILLARY LYMPH NODE MAPPING WITH SENTIMAG    NUMBER OF LYMPH NODES REMOVED: Right 1/1, Left 0/1   CHEMOTHERAPY:  neoadjuvant    RADIATION:yes bilateral breast, recently finished palliative radiation for bone METS to spine    HORMONE TREATMENT: YES  INFECTIONS: NO  LYMPHEDEMA ASSESSMENTS:     LANDMARK RIGHT  03/04/2022 Right   10 cm proximal to olecranon process 30.5  Olecranon process 27.1   10 cm proximal to ulnar styloid process 21.2   Just proximal to ulnar styloid process 16.6   Across hand at thumb web space 21.4   At base of 2nd digit 6.8   (Blank rows = not tested)   LANDMARK LEFT  03/04/2022 LEFT   10 cm proximal to olecranon process 30.7   Olecranon process 26.7   10 cm proximal to ulnar styloid process 20.5   Just proximal to ulnar styloid process 16.3   Across hand at thumb web space 21.1   At base of 2nd digit 6.7   (Blank rows = not tested)      GAIT: Distance walked: 20 feet Assistive device utilized: None Level of assistance: Complete Independence Comments: decreased heel strike, toe off, sideways translation with some LOB  QUICK DASH SURVEY: not completed   TODAY'S TREATMENT:   09/07/2022  Nu step seat 10, UE 10, lev 3 x 6 min Incline stretch 3 x 20-30 sec Sitting calf stretch x 3, HS stretch  x 3 ea 30 sec Gait in clinic  80 ft x 2 with pt lengthening stride; improved heel strike/toe off Reviewed hip hinge for brushing teeth. Able to return demonstrated. Educated in golfers lift and power lift to protect back each x4 using bolster for power lift. Requires VC's for keeping head up with power lift. Practiced half knee to stand x 1. SLS avg Right 5 sec, Left 9 seconds Measured DF and HS ROM, and checked goals with pt. Discussed importance of daily exercises to allow her to feel better without a lot of pain meds 08/31/2022 Nustep seat 10, UE 10, Lev 3 x 6 min Incline stretch 3 x 30 sec Heel raises on ax x 15, marching on ax x 15, Marching with alt. UE raises x 10 Side stepping down and back 3 lengths ea Vector reaches x 7 ea with toe touch, 5 ea no touch. SLS right 4,5, 30 sec, Left 12,12, 22 sec Mini squats x 10 in bars Lunges x 10 ea with intermittent hand hold HS stretch with strap 3 x 30 sec  FABER stretch supine x 3 ea  08/24/2022 Pt tried ppt but had difficulty activating Therapist demonstrated ppt on table and then pt reproduced x 10 Ppt with march 2 x 5 Ppt with alternate arms x 10 Figure 4 stretch x 3 ea 30 sec Ppt with alternate hand to knee 2x 10 Ppt with bolster squeeze x 10 SLR x 5 B with ppt SKTC stretch x 3 B x 30 Nustep seat 10, UE 10  lev 4 x 7 min, 258 steps Incline gastroc stretch x 3 x 30 min Heel toe walking length of bars x 2 and retro walking x 2 SLS bilaterally x 3 ea; held 15-20 sec without LOB 08/20/2022 Nu step seat 10, UE 10, resistance level 3 x 7;20  Marching on ax  x 10, heel raises x 15 no HH Step up  and hold on ax forward and sideways x 10 bilaterally Incline stretch x 3, 30sec Supine HS stretch 3 x 30 sec B Figure 4 stretch x 3 ea 30 sec  Alternate hand to knee with TrA x 10 SLR x 5 B with ppt SKTC stretch x 3 B x 30   06/25/2022  Evaluation only  PATIENT EDUCATION:  08/06/2022 Side Pull: Double Arm   On back, knees bent, feet flat. Arms perpendicular to body, shoulder level, elbows straight but relaxed. Pull arms out to  sides, elbows straight. Resistance band comes across collarbones, hands toward floor. Hold momentarily. Slowly return to starting position. Repeat _5-10__ times. Band color _yellow____   EDUCATION  08/10/2022 Access [email or web address] Bridge, ppt, ppt with march, hip adductor squeze, hip abduction  Access Code: [email or web address] URL: [email or web address] Date: 08/06/2022  Education details: discussed POC/activities that will be performedAccess Code: [email or web address] URL: [email or web address] Date: 07/22/2022 Prepared by: Cheral Almas  Exercises - Supine Hamstring Stretch with Strap  - 1 x daily - 7 x weekly - 3 sets - 10 reps, Meeks decompression x 5 Person educated: Patient Education method: Explanation Education comprehension: verbalized understanding  HOME EXERCISE PROGRAM:  Supine Hamstring Stretch with Strap  - 1 x daily - 7 x weekly - 3 sets - 10 reps, Meeks decompression x 5, supine horizontal abd with TrA activation and red band x 10, standing scapular retraction red x 10 ASSESSMENT:  CLINICAL IMPRESSION:  Pt has achieved STG's and 3/5 Long term goals established at initial evaluation. She has made excellent improvement with DF ROM and HS length,  and also demonstrates improved heel strike and toe off with ambulation and decreased sideways translation with gait. She is now able to return demonstrate good hip hinge technique for brushing teeth and washing face. She needs further review of power lifting technique/golfers lift . Her pain levels remain high, however, she always feels better after exercising and notes improved energy. She will benefit from further therapy to address deficits with balance, and  body mechanics, and to progress to higher level core strength activities. Will also address prophylactic compression sleeves for bilateral UE's for travel and repetitive activities. Will address breast swelling prn  OBJECTIVE IMPAIRMENTS: Abnormal gait, decreased activity tolerance, decreased balance, decreased ROM, decreased strength, increased edema, impaired sensation, improper body mechanics, and postural dysfunction.   ACTIVITY LIMITATIONS: lifting, bending, standing, sleeping, stairs, bed mobility, and locomotion level  PARTICIPATION LIMITATIONS: cleaning, laundry, shopping, and occupation  PERSONAL FACTORS: 3+ comorbidities: bilateral Breast Cancer s/p Radiation with new bone Metastasis and palliative radiation for pain  are also affecting patient's functional outcome.   REHAB POTENTIAL: Good  CLINICAL DECISION MAKING: Evolving/moderate complexity  EVALUATION COMPLEXITY: Low  GOALS: Goals reviewed with patient? Yes  SHORT TERM GOALS: Target date: 07/22/2022  Pt will be independent in a HEP for gentle lumbar/core neutral spine activities and LE flexibility Baseline: Goal status:MET 07/22/2022  2.  Pt will be able to get in and out of bed and brush teeth with good body mechanics Baseline: no knowledge Goal status:MET for in/out of bed, Met for hip hinge 09/07/2022   LONG TERM GOALS: Target date: 2/29/2023  Pt will be approved for Flexi touch Sequential compression pump for management of bilateral breast edema Baseline:  Goal  status:MET  2.  Pt will have AROM for bilateral DF improved to atleast 5 degrees for improved gait pattern Baseline:  Goal status:MET 09/07/2022 3.  Pt will be educated in proper body mechanics for home activities to prevent injury Baseline: no knowledge Goal status:In progress;improving but needs review  4.  Pt will ambulate with improved heel strike /toe off and decreased sideways translation for improved energy conservation Baseline: MET 09/06/2021 Goal status:In progress 5. Pt will be independent in and tolerant of higher level core strength/stability to decrease risk of injury  Baseline:no Knowledge  In progress; improving but needs review 6. Pt will maintain SLS bilaterally x 12-15 seconds to demonstrate functional improvement  Baseline 5 sec Right,   9 seconds Left  RE -EVAL  PLAN  PT FREQUENCY: 2x/week for 2 weeks, then 1x/week x 4 weeks  PT DURATION:6 weeks  PLANNED INTERVENTIONS: Therapeutic exercises, Therapeutic activity, Neuromuscular re-education, Balance training, Patient/Family education, Self Care, Orthotic/Fit training, Aquatic Therapy, Manual lymph drainage, Manual therapy, and Re-evaluation  PLAN FOR NEXT SESSION: update HEP,order compression sleeves,  cont gastroc and HS stretches, higher level core strength, STM prn for chest/muscular tightness, shoulder ROM prn, add standing theraband exs, balance activities   Claris Pong, PT 09/07/2022, 3:08 PM

## 2022-09-07 NOTE — Addendum Note (Signed)
Addended by: Claris Pong on: 09/07/2022 03:11 PM   Modules accepted: Orders

## 2022-09-13 ENCOUNTER — Other Ambulatory Visit (HOSPITAL_COMMUNITY): Payer: Self-pay

## 2022-09-15 ENCOUNTER — Other Ambulatory Visit (HOSPITAL_COMMUNITY)
Admission: RE | Admit: 2022-09-15 | Discharge: 2022-09-15 | Disposition: A | Discharge status: Home / Self Care | Payer: Medicaid Other | Source: Ambulatory Visit | Attending: Primary Care | Admitting: Primary Care

## 2022-09-15 ENCOUNTER — Encounter (INDEPENDENT_AMBULATORY_CARE_PROVIDER_SITE_OTHER): Payer: Self-pay | Admitting: Primary Care

## 2022-09-15 ENCOUNTER — Ambulatory Visit (INDEPENDENT_AMBULATORY_CARE_PROVIDER_SITE_OTHER): Payer: Medicaid Other | Admitting: Primary Care

## 2022-09-15 VITALS — BP 113/69 | HR 97 | Resp 16 | Wt 208.0 lb

## 2022-09-15 DIAGNOSIS — Z124 Encounter for screening for malignant neoplasm of cervix: Secondary | ICD-10-CM | POA: Diagnosis not present

## 2022-09-15 NOTE — Progress Notes (Signed)
  [facility] Patient name: Chelsea Hansen MRN [id number]  Date of birth: 24-Apr-1971 Chief Complaint:   No chief complaint on file.  History of Present Illness:   Chelsea Hansen is a 52 y.o. G28P0 female being seen today for a routine well-woman exam.   CC:gyn   The current method of family planning is none.  No LMP recorded. Patient has had a hysterectomy. Last pap 08/09/09. Last mammogram: 8.4/23. Results were: Family h/o breast cancer: No Last colonoscopy: 11/07/20. Family h/o colorectal cancer: No  Review of Systems:    Denies any headaches, blurred vision, fatigue, shortness of breath, chest pain, abdominal pain, abnormal vaginal discharge/itching/odor/irritation, problems with periods, bowel movements, urination, or intercourse unless otherwise stated above.  Pertinent History Reviewed:   Reviewed past medical,surgical, social and family history.  Reviewed problem list, medications and allergies.  Physical Assessment:  Blood Pressure 113/69   Pulse 97   Respiration 16   Weight 208 lb (94.3 kg)   Oxygen Saturation 97%   Body Mass Index 29.01 kg/m         Physical Examination:  General appearance - well appearing, and in no distress Mental status - alert, oriented to person, place, and time Psych:  She has a normal mood and affect Skin - warm and dry, normal color, no suspicious lesions noted Chest - effort normal, all lung fields clear to auscultation bilaterally Heart - normal rate and regular rhythm Neck:  midline trachea, no thyromegaly or nodules Breasts - breasts appear normal, no suspicious masses, no skin or nipple changes or axillary nodes Educated patient on proper self breast examination and had patient to demonstrate SBE. Abdomen - soft, nontender, nondistended, no masses or organomegaly Pelvic-VULVA: normal appearing vulva with no masses, tenderness or lesions   VAGINA: normal appearing vagina with normal color and  discharge, no lesions   CERVIX: normal appearing cervix without discharge or lesions, no CMT UTERUS: uterus is felt to be normal size, shape, consistency and nontender  ADNEXA: No adnexal masses or tenderness noted. Extremities:  No swelling or varicosities noted  No results found for this or any previous visit (from the past 24 hour(s)).   Assessment & Plan:  Diagnoses and all orders for this visit:  Cervical cancer screening -     Cytology - PAP -     Cervicovaginal ancillary only    Meds: No orders of the defined types were placed in this encounter.   Follow-up: No follow-ups on file.  This note has been created with Surveyor, quantity. Any transcriptional errors are unintentional.   [name], NP 09/15/2022, 3:55 PM

## 2022-09-16 ENCOUNTER — Other Ambulatory Visit (HOSPITAL_COMMUNITY): Payer: Self-pay

## 2022-09-17 ENCOUNTER — Other Ambulatory Visit: Payer: Self-pay

## 2022-09-17 LAB — CERVICOVAGINAL ANCILLARY ONLY
Bacterial Vaginitis (gardnerella): POSITIVE — AB
Candida Glabrata: NEGATIVE
Candida Vaginitis: NEGATIVE
Chlamydia: NEGATIVE
Comment: NEGATIVE
Comment: NEGATIVE
Comment: NEGATIVE
Comment: NEGATIVE
Comment: NEGATIVE
Comment: NORMAL
Neisseria Gonorrhea: NEGATIVE
Trichomonas: NEGATIVE

## 2022-09-18 ENCOUNTER — Other Ambulatory Visit (HOSPITAL_COMMUNITY): Payer: Self-pay

## 2022-09-18 ENCOUNTER — Other Ambulatory Visit (INDEPENDENT_AMBULATORY_CARE_PROVIDER_SITE_OTHER): Payer: Self-pay | Admitting: Primary Care

## 2022-09-18 DIAGNOSIS — B9689 Other specified bacterial agents as the cause of diseases classified elsewhere: Secondary | ICD-10-CM

## 2022-09-18 MED ORDER — METRONIDAZOLE 500 MG PO TABS
500.0000 mg | ORAL_TABLET | Freq: Two times a day (BID) | ORAL | 0 refills | Status: DC
  Filled 2022-09-18: qty 14, 7d supply, fill #0

## 2022-09-20 ENCOUNTER — Encounter: Payer: Self-pay | Admitting: Hematology and Oncology

## 2022-09-20 ENCOUNTER — Other Ambulatory Visit (HOSPITAL_COMMUNITY): Payer: Self-pay

## 2022-09-20 NOTE — Progress Notes (Signed)
Closing this encounter for our symptom management clinic. Patient seen by nursing to administer fluids and IV medication.Gardiner Rhyme, RN

## 2022-09-21 ENCOUNTER — Ambulatory Visit: Payer: Medicaid Other | Attending: Radiation Oncology

## 2022-09-21 DIAGNOSIS — R209 Unspecified disturbances of skin sensation: Secondary | ICD-10-CM | POA: Insufficient documentation

## 2022-09-21 DIAGNOSIS — C50412 Malignant neoplasm of upper-outer quadrant of left female breast: Secondary | ICD-10-CM | POA: Insufficient documentation

## 2022-09-21 DIAGNOSIS — C50411 Malignant neoplasm of upper-outer quadrant of right female breast: Secondary | ICD-10-CM | POA: Insufficient documentation

## 2022-09-21 DIAGNOSIS — I890 Lymphedema, not elsewhere classified: Secondary | ICD-10-CM | POA: Insufficient documentation

## 2022-09-21 DIAGNOSIS — C7951 Secondary malignant neoplasm of bone: Secondary | ICD-10-CM | POA: Insufficient documentation

## 2022-09-21 DIAGNOSIS — Z170 Estrogen receptor positive status [ER+]: Secondary | ICD-10-CM

## 2022-09-21 DIAGNOSIS — G893 Neoplasm related pain (acute) (chronic): Secondary | ICD-10-CM | POA: Insufficient documentation

## 2022-09-21 LAB — CYTOLOGY - PAP
Comment: NEGATIVE
Comment: NEGATIVE
Comment: NEGATIVE
Diagnosis: NEGATIVE
HPV 16: NEGATIVE
HPV 18 / 45: NEGATIVE
High risk HPV: POSITIVE — AB

## 2022-09-21 NOTE — Therapy (Signed)
OUTPATIENT PHYSICAL THERAPY ONCOLOGY EVALUATION  Patient Name: SIEARRA LIGHTER MRN: [id number] DOB:1971-03-01, 52 y.o., female Today's Date: 09/21/2022  END OF SESSION:  PT End of Session - 09/21/22 1602     Visit Number 14    Number of Visits 25    Date for PT Re-Evaluation 10/19/22    Authorization Type Medicaid    Authorization Time Period 09/20/2022 -10/31/2022    Authorization - Visit Number 1    Authorization - Number of Visits 12    Progress Note Due on Visit 25    PT Start Time 1605    PT Stop Time 1700    PT Time Calculation (min) 55 min    Activity Tolerance Patient tolerated treatment well    Behavior During Therapy [facility] for tasks assessed/performed             Past Medical History:  Diagnosis Date   Allergy    seasonal allergies   Anxiety    on meds   Asthma    uses inhaler   Breast cancer 2012   RIGHT lumpectomy   Cancer 2022   RIGHT breast lump-dx 2022   Depression    on meds   DVT (deep venous thrombosis) 2010   after hysterectomy   Family history of uterine cancer    GERD (gastroesophageal reflux disease)    with certain foods/OTC PRN meds   Headache(784.0)    History of radiation therapy    Bilateral breast- 09/03/21-11/02/21- Dr. [name]   Hyperlipidemia    on meds   Hypertension    on meds   SVT (supraventricular tachycardia)    Past Surgical History:  Procedure Laterality Date   ABDOMINAL HYSTERECTOMY     AXILLARY SENTINEL NODE BIOPSY Left 07/09/2021   Procedure: LEFT AXILLARY SENTINEL NODE BIOPSY;  Surgeon: [name], MD;  Location: [place];  Service: General;  Laterality: Left;   BIOPSY  06/29/2022   Procedure: BIOPSY;  Surgeon: Doran Stabler, MD;  Location: [facility];  Service: Gastroenterology;;   BREAST EXCISIONAL BIOPSY Right 09/16/2009   BREAST LUMPECTOMY WITH RADIOACTIVE SEED LOCALIZATION Bilateral 07/09/2021   Procedure: BILATERAL BREAST LUMPECTOMY WITH RADIOACTIVE SEED LOCALIZATION;  Surgeon:  [name], MD;  Location: [place];  Service: General;  Laterality: Bilateral;   BREAST SURGERY     lumpectomy   ESOPHAGOGASTRODUODENOSCOPY (EGD) WITH PROPOFOL N/A 06/29/2022   Procedure: ESOPHAGOGASTRODUODENOSCOPY (EGD) WITH PROPOFOL;  Surgeon: Doran Stabler, MD;  Location: [facility];  Service: Gastroenterology;  Laterality: N/A;   PORT-A-CATH REMOVAL Left 07/09/2021   Procedure: REMOVAL PORT-A-CATH;  Surgeon: [name], MD;  Location: [place];  Service: General;  Laterality: Left;   PORTACATH PLACEMENT Left 01/26/2021   Procedure: INSERTION PORT-A-CATH;  Surgeon: [name], MD;  Location: [facility];  Service: General;  Laterality: Left;   RADIOACTIVE SEED GUIDED AXILLARY SENTINEL LYMPH NODE Right 07/09/2021   Procedure: RADIOACTIVE SEED GUIDED RIGHT AXILLARY SENTINEL LYMPH NODE DISSECTION;  Surgeon: [name], MD;  Location: [place];  Service: General;  Laterality: Right;   TUBAL LIGATION     Patient Active Problem List   Diagnosis Date Noted   Chronic constipation 06/30/2022   Nausea and vomiting in adult 06/29/2022   Hematemesis 06/28/2022   Metastasis to bone 05/20/2022   PSVT (paroxysmal supraventricular tachycardia) 07/08/2021   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   GERD (gastroesophageal reflux disease) 05/27/2021   Hyperlipidemia 05/27/2021   Hypertension 05/27/2021  Pain management contract signed 05/04/2021   Chronic pain syndrome (breast cancer) 05/04/2021   Palpitations 03/25/2021   Sinus bradycardia 03/25/2021   Daytime somnolence 03/25/2021   Snoring 03/25/2021   Obesity (BMI 30-39.9) 03/25/2021   Port-A-Cath in place 01/27/2021   Genetic testing 12/24/2020   Family history of uterine cancer 12/17/2020   Malignant neoplasm of upper-outer quadrant of right breast in female, estrogen receptor positive 12/12/2020   Malignant neoplasm of upper-outer quadrant of left breast in  female, estrogen receptor positive 12/12/2020   Hypokalemia 01/02/2012   Asthma 01/01/2012   Bradycardia 01/01/2012   Syncope 12/31/2011   Mediastinal adenopathy 12/31/2011   Anemia 12/31/2011   Chest pain 12/31/2011    PCP: Juluis Mire  REFERRING PROVIDER: [name]  REFERRING DIAG: Malignant neoplasm of upper-outer quadrant of right breast in female, estrogen receptor positive ([place])  Malignant neoplasm of upper-outer quadrant of left breast in female, estrogen receptor positive ([place])  Lymphedema, not elsewhere classified  Metastasis to bone ([place])  Neoplasm related pain (acute) (chronic)  THERAPY DIAG:  Malignant neoplasm of upper-outer quadrant of right breast in female, estrogen receptor positive  Malignant neoplasm of upper-outer quadrant of left breast in female, estrogen receptor positive  Lymphedema, not elsewhere classified  Metastasis to bone  Neoplasm related pain (acute) (chronic)  ONSET DATE: 01/2021  Rationale for Evaluation and Treatment: Rehabilitation  SUBJECTIVE:                                                                                                                                                                                           SUBJECTIVE STATEMENT:  I feel like both breasts are swelling and under my arms for about a week. I have used the Flexi touch 1x per day and I feel better after I use it. My compression bra that has the hooks is broken.  I was approved for my disability on March 8. My back has been doing a lot better without meds because I have been doing my back exercises.   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 Pt now  STAGE IV Breast Cancer with bone mets throughout thoracic and lumbar regions and is pending radiation to her lumbar spine next week.    PAIN:  Are you having pain? Yes NPRS scale: 4/10 today in back, 7 in left hip Pain location: LB Pain orientation: Bilateral  PAIN TYPE: aching, sharp,  Pain description:intermittent Aggravating factors: standing, sleeping, walking, stairs, housework Relieving factors: Pain meds,changing positions,  PRECAUTIONS: BONE METS currently s/p palliative radiation to spine, bilateral breast lymphedema, bilateral UE lymphedema risk  WEIGHT BEARING RESTRICTIONS: No  FALLS:  Has patient fallen in last 6 months? No  LIVING ENVIRONMENT: Lives with: currently living alone Lives [id number] presently  Has following equipment : Single point cane, Walker - 4 wheeled, shower chair, and bed side commode  OCCUPATION: mgr at [employer]  LEISURE: shop, dominoes  HAND DOMINANCE: right   PRIOR LEVEL OF FUNCTION: Independent  PATIENT GOALS:   Get back to life, keep breast swelling down, learn exercises to strengthen my back/core,keep shoulder ROM on the right track   OBJECTIVE:  COGNITION: Overall cognitive status: Within functional limits for tasks assessed   PALPATION:   OBSERVATIONS / OTHER ASSESSMENTS: bilateral breasts with enlarged pores but improved since prior treatment session  SENSATION: Light touch: Appears intact   POSTURE: forward head, rounded shoulders  UPPER EXTREMITY AROM/PROM:  A/PROM RIGHT   eval   Shoulder extension 55  Shoulder flexion 157  Shoulder abduction 174  Shoulder internal rotation 65  Shoulder external rotation 98    (Blank rows = not tested)  A/PROM LEFT   eval  Shoulder extension 56  Shoulder flexion 156  Shoulder abduction 172  Shoulder internal rotation 65  Shoulder  external rotation 95    (Blank rows = not tested)  CERVICAL AROM: All within functional limits:    LUMBAR ROM: Lumbar ROM without increasing pain; Bilateral SB decreased 25%, FB decreased 90%, BB decreased 90% all with pain  ANKLE  AROM DF : right -12,  LEFT  -8,   , Jul 22, 2022 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,  09/07/2022  Right  0             Left  2  HS Length: 30 deg Bilaterally, 07/22/2022  Left   35    Degrees     Right 45 Degrees, 09/07/2022   left 50          Right: 60  UPPER EXTREMITY STRENGTH: WFL  LOWER EXTREMITY MMT: WFL     LYMPHEDEMA ASSESSMENTS:   SURGERY TYPE/DATE: 07/09/2021 BILATERAL BREAST LUMPECTOMY WITH RADIOACTIVE SEED LOCALIZATION RADIOACTIVE SEED GUIDED RIGHT AXILLARY TARGETED LYMPH NODE DISSECTION LEFT AXILLARY SENTINEL NODE BIOPSY LEFT AXILLARY LYMPH NODE MAPPING WITH SENTIMAG    NUMBER OF LYMPH NODES REMOVED: Right 1/1, Left 0/1   CHEMOTHERAPY:  neoadjuvant    RADIATION:yes bilateral breast, recently finished palliative radiation for bone METS to spine    HORMONE TREATMENT: YES  INFECTIONS: NO  LYMPHEDEMA ASSESSMENTS:     LANDMARK RIGHT  03/04/2022 Right   10 cm proximal to olecranon process 30.5 28.8  Olecranon process 27.1 25.4  10 cm proximal to ulnar styloid process 21.2 20.9  Just proximal to ulnar styloid process 16.6 16.3  Across hand at thumb web space 21.4 20.4  At base of 2nd digit 6.8 6.7  (Blank rows = not tested)   LANDMARK LEFT  03/04/2022 LEFT  10 cm proximal to olecranon process 30.7 29.4  Olecranon process 26.7 25.8  10 cm proximal to ulnar styloid process 20.5 20.3  Just proximal to ulnar styloid process 16.3 16.15  Across hand at thumb web space 21.1 20.5  At base of 2nd digit 6.7  6.4  Blank rows = not tested)      GAIT: Distance walked: 20 feet Assistive device utilized: None Level of assistance: Complete Independence Comments: decreased heel strike, toe off, sideways translation with some LOB  QUICK DASH  SURVEY: not completed   TODAY'S TREATMENT:   09/21/2022 Assessed swelling; Right breast with fibrosis at lateral breast and enlarged pores medially. Swelling noted distal to axillary incision. Discussed importance of wearing compression bra to keep swelling down. Pt to make appt to get another bra or 2 this week Pulleys x 2 min flexion and abduction Nustep  seat 10, UE 10, Lev 4  x6 min. Ppt x 10 Ppt with opp hand to knee x 10 ea SLR with ppt 2x5  Reviewed golfers lift x 5 ea side and power lift x 5 Measured bilateral Ue's; no sign of UE lymphedema  09/07/2022  Nu step seat 10, UE 10, lev 3 x 6 min Incline stretch 3 x 20-30 sec Sitting calf stretch x 3, HS stretch  x 3 ea 30 sec Gait in clinic  80 ft x 2 with pt lengthening stride; improved heel strike/toe off Reviewed hip hinge for brushing teeth. Able to return demonstrated. Educated in golfers lift and power lift to protect back each x4 using bolster for power lift. Requires VC's for keeping head up with power lift. Practiced half knee to stand x 1. SLS avg Right 5 sec, Left 9 seconds Measured DF and HS ROM, and checked goals with pt. Discussed importance of daily exercises to allow her to feel better without a lot of pain meds 08/31/2022 Nustep seat 10, UE 10, Lev 3 x 6 min Incline stretch 3 x 30 sec Heel raises on ax x 15, marching on ax x 15, Marching with alt. UE raises x 10 Side stepping down and back 3 lengths ea Vector reaches x 7 ea with toe touch, 5 ea no touch. SLS right 4,5, 30 sec, Left 12,12, 22 sec Mini squats x 10 in bars Lunges x 10 ea with intermittent hand hold HS stretch with strap 3 x 30 sec  FABER stretch supine x 3 ea  08/24/2022 Pt tried ppt but had difficulty activating Therapist demonstrated ppt on table and then pt reproduced x 10 Ppt with march 2 x 5 Ppt with alternate arms x 10 Figure 4 stretch x 3 ea 30 sec Ppt with alternate hand to knee 2x 10 Ppt with bolster squeeze x 10 SLR x 5 B with  ppt SKTC stretch x 3 B x 30 Nustep seat 10, UE 10 lev 4 x 7 min, 258 steps Incline gastroc stretch x 3 x 30 min Heel toe walking length of bars x 2 and retro walking x 2 SLS bilaterally x 3 ea; held 15-20 sec without LOB 08/20/2022 Nu step seat 10, UE 10, resistance level 3 x 7;20  Marching on ax  x 10, heel raises x 15 no HH Step up  and hold on ax forward and sideways x 10 bilaterally Incline stretch x 3, 30sec Supine HS stretch 3 x 30 sec B Figure 4 stretch x 3 ea 30 sec  Alternate hand to knee with TrA x 10 SLR x 5 B with ppt SKTC stretch x 3 B x 30   06/25/2022  Evaluation only  PATIENT EDUCATION:  08/06/2022 Side Pull: Double Arm   On back, knees bent, feet flat. Arms perpendicular to body, shoulder level, elbows straight but relaxed. Pull arms out to sides, elbows straight. Resistance  band comes across collarbones, hands toward floor. Hold momentarily. Slowly return to starting position. Repeat _5-10__ times. Band color _yellow____   EDUCATION  08/10/2022 Access code;[email or web address] Bridge, ppt, ppt with march, hip adductor squeze, hip abduction  Access Code: [email or web address] URL: [email or web address] Date: 08/06/2022  Education details: discussed POC/activities that will be performedAccess Code: [email or web address] URL: [email or web address] Date: 07/22/2022 Prepared by: Cheral Almas  Exercises - Supine Hamstring Stretch with Strap  - 1 x daily - 7 x weekly - 3 sets - 10 reps, Meeks decompression x 5 Person educated: Patient Education method: Explanation Education comprehension: verbalized understanding  HOME EXERCISE PROGRAM:  Supine Hamstring Stretch with Strap  - 1 x daily - 7 x weekly - 3 sets - 10 reps, Meeks decompression x 5, supine horizontal abd with TrA activation and red band x 10, standing scapular retraction red x 10 ASSESSMENT:  CLINICAL IMPRESSION: Pt reminded about importance of wearing compression bra on a regular basis to keep swelling  down, in addition to Flexi touch. She is compliant with HEP and her back has been feeling better with fewer pain meds since she is doing exercises regular. She did well demonstrating lifting techniques with very few cues required. She is getting measured for Compression sleeves next Wednesday,   OBJECTIVE IMPAIRMENTS: Abnormal gait, decreased activity tolerance, decreased balance, decreased ROM, decreased strength, increased edema, impaired sensation, improper body mechanics, and postural dysfunction.   ACTIVITY LIMITATIONS: lifting, bending, standing, sleeping, stairs, bed mobility, and locomotion level  PARTICIPATION LIMITATIONS: cleaning, laundry, shopping, and occupation  PERSONAL FACTORS: 3+ comorbidities: bilateral Breast Cancer s/p Radiation with new bone Metastasis and palliative radiation for pain  are also affecting patient's functional outcome.   REHAB POTENTIAL: Good  CLINICAL DECISION MAKING: Evolving/moderate complexity  EVALUATION COMPLEXITY: Low  GOALS: Goals reviewed with patient? Yes  SHORT TERM GOALS: Target date: 07/22/2022  Pt will be independent in a HEP for gentle lumbar/core neutral spine activities and LE flexibility Baseline: Goal status:MET 07/22/2022  2.  Pt will be able to get in and out of bed and brush teeth with good body mechanics Baseline: no knowledge Goal status:MET for in/out of bed, Met for hip hinge 09/07/2022   LONG TERM GOALS: Target date: 2/29/2023  Pt will be approved for Flexi touch Sequential compression pump for management of bilateral breast edema Baseline:  Goal status:MET  2.  Pt will have AROM for bilateral DF improved to atleast 5 degrees for improved gait pattern Baseline:  Goal status:MET 09/07/2022 3.  Pt will be educated in proper body mechanics for home activities to prevent injury Baseline: no knowledge Goal status:In progress;improving but needs review  4.  Pt will ambulate with improved heel strike /toe off and decreased  sideways translation for improved energy conservation Baseline: MET 09/06/2021 Goal status:In progress 5. Pt will be independent in and tolerant of higher level core strength/stability to decrease risk of injury  Baseline:no Knowledge  In progress; improving but needs review 6. Pt will maintain SLS bilaterally x 12-15 seconds to demonstrate functional improvement  Baseline 5 sec Right,   9 seconds Left  RE -EVAL PLAN  PT FREQUENCY: 2x/week for 2 weeks, then 1x/week x 4 weeks  PT DURATION:6 weeks  PLANNED INTERVENTIONS: Therapeutic exercises, Therapeutic activity, Neuromuscular re-education, Balance training, Patient/Family education, Self Care, Orthotic/Fit training, Aquatic Therapy, Manual lymph drainage, Manual therapy, and Re-evaluation  PLAN FOR NEXT SESSION: update HEP,order compression sleeves  April 10 Print body mechanics pictures,  cont gastroc and HS stretches, higher level core  strength, STM prn for chest/muscular tightness, shoulder ROM prn, add standing theraband exs, balance activities   Claris Pong, PT 09/21/2022, 5:13 PM

## 2022-09-23 ENCOUNTER — Ambulatory Visit: Payer: Medicaid Other

## 2022-09-23 ENCOUNTER — Other Ambulatory Visit (INDEPENDENT_AMBULATORY_CARE_PROVIDER_SITE_OTHER): Payer: Self-pay | Admitting: Primary Care

## 2022-09-28 ENCOUNTER — Ambulatory Visit: Payer: Medicaid Other

## 2022-09-28 DIAGNOSIS — I890 Lymphedema, not elsewhere classified: Secondary | ICD-10-CM

## 2022-09-28 DIAGNOSIS — G893 Neoplasm related pain (acute) (chronic): Secondary | ICD-10-CM

## 2022-09-28 DIAGNOSIS — C50411 Malignant neoplasm of upper-outer quadrant of right female breast: Secondary | ICD-10-CM | POA: Diagnosis not present

## 2022-09-28 DIAGNOSIS — R209 Unspecified disturbances of skin sensation: Secondary | ICD-10-CM

## 2022-09-28 DIAGNOSIS — C7951 Secondary malignant neoplasm of bone: Secondary | ICD-10-CM

## 2022-09-28 DIAGNOSIS — Z170 Estrogen receptor positive status [ER+]: Secondary | ICD-10-CM

## 2022-09-28 NOTE — Therapy (Signed)
OUTPATIENT PHYSICAL THERAPY ONCOLOGY EVALUATION  Patient Name: Chelsea Hansen MRN: [id number] DOB:31-May-1971, 52 y.o., female Today's Date: 09/28/2022  END OF SESSION:  PT End of Session - 09/28/22 1102     Visit Number 15    Number of Visits 25    Date for PT Re-Evaluation 10/19/22    Authorization Type Medicaid    Authorization - Visit Number 2    Authorization - Number of Visits 12    PT Start Time 1103    PT Stop Time 1157    PT Time Calculation (min) 54 min    Activity Tolerance Patient tolerated treatment well    Behavior During Therapy WFL for tasks assessed/performed             Past Medical History:  Diagnosis Date   Allergy    seasonal allergies   Anxiety    on meds   Asthma    uses inhaler   Breast cancer 2012   RIGHT lumpectomy   Cancer 2022   RIGHT breast lump-dx 2022   Depression    on meds   DVT (deep venous thrombosis) 2010   after hysterectomy   Family history of uterine cancer    GERD (gastroesophageal reflux disease)    with certain foods/OTC PRN meds   Headache(784.0)    History of radiation therapy    Bilateral breast- 09/03/21-11/02/21- Dr. Antony BlackbirdJames Kinard   Hyperlipidemia    on meds   Hypertension    on meds   SVT (supraventricular tachycardia)    Past Surgical History:  Procedure Laterality Date   ABDOMINAL HYSTERECTOMY     AXILLARY SENTINEL NODE BIOPSY Left 07/09/2021   Procedure: LEFT AXILLARY SENTINEL NODE BIOPSY;  Surgeon: Abigail MiyamotoBlackman, Douglas, MD;  Location: [facility];  Service: General;  Laterality: Left;   BIOPSY  06/29/2022   Procedure: BIOPSY;  Surgeon: Sherrilyn Ristanis, Henry L III, MD;  Location: [facility];  Service: Gastroenterology;;   BREAST EXCISIONAL BIOPSY Right 09/16/2009   BREAST LUMPECTOMY WITH RADIOACTIVE SEED LOCALIZATION Bilateral 07/09/2021   Procedure: BILATERAL BREAST LUMPECTOMY WITH RADIOACTIVE SEED LOCALIZATION;  Surgeon: Abigail MiyamotoBlackman, Douglas, MD;  Location: [facility];  Service: General;   Laterality: Bilateral;   BREAST SURGERY     lumpectomy   ESOPHAGOGASTRODUODENOSCOPY (EGD) WITH PROPOFOL N/A 06/29/2022   Procedure: ESOPHAGOGASTRODUODENOSCOPY (EGD) WITH PROPOFOL;  Surgeon: Sherrilyn Ristanis, Henry L III, MD;  Location: [facility];  Service: Gastroenterology;  Laterality: N/A;   PORT-A-CATH REMOVAL Left 07/09/2021   Procedure: REMOVAL PORT-A-CATH;  Surgeon: Abigail MiyamotoBlackman, Douglas, MD;  Location: [facility];  Service: General;  Laterality: Left;   PORTACATH PLACEMENT Left 01/26/2021   Procedure: INSERTION PORT-A-CATH;  Surgeon: Abigail MiyamotoBlackman, Douglas, MD;  Location: [facility];  Service: General;  Laterality: Left;   RADIOACTIVE SEED GUIDED AXILLARY SENTINEL LYMPH NODE Right 07/09/2021   Procedure: RADIOACTIVE SEED GUIDED RIGHT AXILLARY SENTINEL LYMPH NODE DISSECTION;  Surgeon: Abigail MiyamotoBlackman, Douglas, MD;  Location: [facility];  Service: General;  Laterality: Right;   TUBAL LIGATION     Patient Active Problem List   Diagnosis Date Noted   Chronic constipation 06/30/2022   Nausea and vomiting in adult 06/29/2022   Hematemesis 06/28/2022   Metastasis to bone 05/20/2022   PSVT (paroxysmal supraventricular tachycardia) 07/08/2021   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   GERD (gastroesophageal reflux disease) 05/27/2021   Hyperlipidemia 05/27/2021   Hypertension 05/27/2021   Pain management contract signed 05/04/2021   Chronic pain syndrome (breast cancer) 05/04/2021  Palpitations 03/25/2021   Sinus bradycardia 03/25/2021   Daytime somnolence 03/25/2021   Snoring 03/25/2021   Obesity (BMI 30-39.9) 03/25/2021   Port-A-Cath in place 01/27/2021   Genetic testing 12/24/2020   Family history of uterine cancer 12/17/2020   Malignant neoplasm of upper-outer quadrant of right breast in female, estrogen receptor positive 12/12/2020   Malignant neoplasm of upper-outer quadrant of left breast in female, estrogen receptor positive 12/12/2020   Hypokalemia 01/02/2012   Asthma  01/01/2012   Bradycardia 01/01/2012   Syncope 12/31/2011   Mediastinal adenopathy 12/31/2011   Anemia 12/31/2011   Chest pain 12/31/2011    PCP: Gwinda Passe  REFERRING PROVIDER: Antony Blackbird  REFERRING DIAG: Malignant neoplasm of upper-outer quadrant of right breast in female, estrogen receptor positive (HCC)  Malignant neoplasm of upper-outer quadrant of left breast in female, estrogen receptor positive (HCC)  Lymphedema, not elsewhere classified  Metastasis to bone (HCC)  Neoplasm related pain (acute) (chronic)  THERAPY DIAG:  Malignant neoplasm of upper-outer quadrant of right breast in female, estrogen receptor positive  Malignant neoplasm of upper-outer quadrant of left breast in female, estrogen receptor positive  Lymphedema, not elsewhere classified  Metastasis to bone  Neoplasm related pain (acute) (chronic)  Disturbance of skin sensation  ONSET DATE: 01/2021  Rationale for Evaluation and Treatment: Rehabilitation  SUBJECTIVE:                                                                                                                                                                                           SUBJECTIVE STATEMENT:  Breast swelling is better. I have been wearing my compression bra and using the Flexi touch. I am trying to find a house to live in. My left arm has been hurting real bad .for about a week  PERTINENT HISTORY:  Neoadjuvant chemotherapy with Doxorubicin and Cyclophosphamide given x 4 beginning 01/27/2021 and completing on 03/10/2021 followed by weekly paclitaxel x 12 beginning 05/2021 07/09/2021: Right lumpectomy: Grade 2 invasive lobular cancer, 2.5 cm, LCIS, margins negative, LCIS focally at anterior margin, 1/1 lymph node positive, ER 95%, PR 100%, HER2 negative, Ki-67 40% Left lumpectomy: High-grade DCIS: 0.7 cm, margins negative, 0/1 lymph node negative ER 95%, PR 100%. Pt now  STAGE IV Breast Cancer with bone mets throughout  thoracic and lumbar regions and is pending radiation to her lumbar spine next week.    PAIN:  Are you having pain? Yes NPRS scale: 5/10 today in back, 5 in left hip Pain location: LB Pain orientation: Bilateral  PAIN TYPE: aching, sharp,  Pain description:intermittent Aggravating factors: standing, sleeping, walking, stairs, housework Relieving factors: Pain meds,changing positions,  PRECAUTIONS: BONE METS currently s/p palliative radiation to spine, bilateral breast lymphedema, bilateral UE lymphedema risk  WEIGHT BEARING RESTRICTIONS: No  FALLS:  Has patient fallen in last 6 months? No  LIVING ENVIRONMENT: Lives with: currently living alone Lives ZO:XWRUE presently  Has following equipment : Single point cane, Walker - 4 wheeled, shower chair, and bed side commode  OCCUPATION: mgr at [employer]  LEISURE: shop, dominoes  HAND DOMINANCE: right   PRIOR LEVEL OF FUNCTION: Independent  PATIENT GOALS:   Get back to life, keep breast swelling down, learn exercises to strengthen my back/core,keep shoulder ROM on the right track   OBJECTIVE:  COGNITION: Overall cognitive status: Within functional limits for tasks assessed   PALPATION:   OBSERVATIONS / OTHER ASSESSMENTS: bilateral breasts with enlarged pores but improved since prior treatment session  SENSATION: Light touch: Appears intact   POSTURE: forward head, rounded shoulders  UPPER EXTREMITY AROM/PROM:  A/PROM RIGHT   eval   Shoulder extension 55  Shoulder flexion 157  Shoulder abduction 174  Shoulder internal rotation 65  Shoulder external rotation 98    (Blank rows = not tested)  A/PROM LEFT   eval  Shoulder extension 56  Shoulder flexion 156  Shoulder abduction 172  Shoulder internal rotation 65  Shoulder external rotation 95    (Blank rows = not tested)  CERVICAL AROM: All within functional limits:    LUMBAR ROM: Lumbar ROM without increasing pain; Bilateral SB decreased 25%, FB decreased  90%, BB decreased 90% all with pain  ANKLE  AROM DF : right -12,  LEFT  -8,   , Jul 22, 2022 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,  09/07/2022  Right  0             Left  2  HS Length: 30 deg Bilaterally, 07/22/2022  Left   35    Degrees     Right 45 Degrees, 09/07/2022   left 50          Right: 60  UPPER EXTREMITY STRENGTH: WFL  LOWER EXTREMITY MMT: WFL     LYMPHEDEMA ASSESSMENTS:   SURGERY TYPE/DATE: 07/09/2021 BILATERAL BREAST LUMPECTOMY WITH RADIOACTIVE SEED LOCALIZATION RADIOACTIVE SEED GUIDED RIGHT AXILLARY TARGETED LYMPH NODE DISSECTION LEFT AXILLARY SENTINEL NODE BIOPSY LEFT AXILLARY LYMPH NODE MAPPING WITH SENTIMAG    NUMBER OF LYMPH NODES REMOVED: Right 1/1, Left 0/1   CHEMOTHERAPY:  neoadjuvant    RADIATION:yes bilateral breast, recently finished palliative radiation for bone METS to spine    HORMONE TREATMENT: YES  INFECTIONS: NO  LYMPHEDEMA ASSESSMENTS:     LANDMARK RIGHT  03/04/2022 Right   10 cm proximal to olecranon process 30.5 28.8  Olecranon process 27.1 25.4  10 cm proximal to ulnar styloid process 21.2 20.9  Just proximal to ulnar styloid process 16.6 16.3  Across hand at thumb web space 21.4 20.4  At base of 2nd digit 6.8 6.7  (Blank rows = not tested)   LANDMARK LEFT  03/04/2022 LEFT  10 cm proximal to olecranon process 30.7 29.4  Olecranon process 26.7 25.8  10 cm proximal to ulnar styloid process 20.5 20.3  Just proximal to ulnar styloid process 16.3 16.15  Across hand at thumb web space 21.1 20.5  At base of 2nd digit 6.7 6.4  Blank rows = not tested)      GAIT: Distance walked: 20 feet Assistive device utilized: None Level of assistance: Complete Independence Comments: decreased heel strike, toe off, sideways translation with some LOB  QUICK DASH SURVEY: not  completed   TODAY'S TREATMENT:  09/28/2022 NU step seat 10, UE 10, resistance 4 x 6 min Checked Bilateral UE;cording noted bilateral axillary through antecubital regions more  palpable on right but tight bilaterally. MFR bilateral upper arms and antecubital fossa to address cords  Supine wand flexion and scaption x 3 ea Supine bilateral AROM flexion, scaption, horizontal abd x 3 ea LTR x 3 bilaterally Airex beam tandem walking 6 lengths no HH, sideways walking 6 lengths no HH Partial step ups forward on 8" step x 10  no HH, and side step ups on 6 in step x 10on Bilateral gastroc stretch on incline x 4 Pulleys flexion and abduction x 1 min ea Showed pt NTS to gently stretch bilateral UE cording   09/21/2022 Assessed swelling; Right breast with fibrosis at lateral breast and enlarged pores medially. Swelling noted distal to axillary incision. Discussed importance of wearing compression bra to keep swelling down. Pt to make appt to get another bra or 2 this week Pulleys x 2 min flexion and abduction Nustep  seat 10, UE 10, Lev 4  x6 min. Ppt x 10 Ppt with opp hand to knee x 10 ea SLR with ppt 2x5  Reviewed golfers lift x 5 ea side and power lift x 5 Measured bilateral Ue's; no sign of UE lymphedema  09/07/2022  Nu step seat 10, UE 10, lev 3 x 6 min Incline stretch 3 x 20-30 sec Sitting calf stretch x 3, HS stretch  x 3 ea 30 sec Gait in clinic  80 ft x 2 with pt lengthening stride; improved heel strike/toe off Reviewed hip hinge for brushing teeth. Able to return demonstrated. Educated in golfers lift and power lift to protect back each x4 using bolster for power lift. Requires VC's for keeping head up with power lift. Practiced half knee to stand x 1. SLS avg Right 5 sec, Left 9 seconds Measured DF and HS ROM, and checked goals with pt. Discussed importance of daily exercises to allow her to feel better without a lot of pain meds 08/31/2022 Nustep seat 10, UE 10, Lev 3 x 6 min Incline stretch 3 x 30 sec Heel raises on ax x 15, marching on ax x 15, Marching with alt. UE raises x 10 Side stepping down and back 3 lengths ea Vector reaches x 7 ea with toe touch,  5 ea no touch. SLS right 4,5, 30 sec, Left 12,12, 22 sec Mini squats x 10 in bars Lunges x 10 ea with intermittent hand hold HS stretch with strap 3 x 30 sec  FABER stretch supine x 3 ea  08/24/2022 Pt tried ppt but had difficulty activating Therapist demonstrated ppt on table and then pt reproduced x 10 Ppt with march 2 x 5 Ppt with alternate arms x 10 Figure 4 stretch x 3 ea 30 sec Ppt with alternate hand to knee 2x 10 Ppt with bolster squeeze x 10 SLR x 5 B with ppt SKTC stretch x 3 B x 30 Nustep seat 10, UE 10 lev 4 x 7 min, 258 steps Incline gastroc stretch x 3 x 30 min Heel toe walking length of bars x 2 and retro walking x 2 SLS bilaterally x 3 ea; held 15-20 sec without LOB 08/20/2022 Nu step seat 10, UE 10, resistance level 3 x 7;20  Marching on ax  x 10, heel raises x 15 no HH Step up  and hold on ax forward and sideways x 10 bilaterally Incline stretch x 3,  30sec Supine HS stretch 3 x 30 sec B Figure 4 stretch x 3 ea 30 sec  Alternate hand to knee with TrA x 10 SLR x 5 B with ppt SKTC stretch x 3 B x 30   06/25/2022  Evaluation only  PATIENT EDUCATION:  08/06/2022 Side Pull: Double Arm   On back, knees bent, feet flat. Arms perpendicular to body, shoulder level, elbows straight but relaxed. Pull arms out to sides, elbows straight. Resistance band comes across collarbones, hands toward floor. Hold momentarily. Slowly return to starting position. Repeat _5-10__ times. Band color _yellow____   EDUCATION  08/10/2022 Access code;[email or web address] Bridge, ppt, ppt with march, hip adductor squeze, hip abduction  Access Code: [email or web address] URL: [email or web address] Date: 08/06/2022  Education details: discussed POC/activities that will be performedAccess Code: [email or web address] URL: [email or web address] Date: 07/22/2022 Prepared by: Alvira Monday  Exercises - Supine Hamstring Stretch with Strap  - 1 x daily - 7 x weekly - 3 sets - 10 reps, Meeks  decompression x 5 Person educated: Patient Education method: Explanation Education comprehension: verbalized understanding  HOME EXERCISE PROGRAM:  Supine Hamstring Stretch with Strap  - 1 x daily - 7 x weekly - 3 sets - 10 reps, Meeks decompression x 5, supine horizontal abd with TrA activation and red band x 10, standing scapular retraction red x 10 ASSESSMENT:  CLINICAL IMPRESSION: Pt with cording bilateral UE's which started Saturday after doing biceps curls with 5 # and then working Sunday and Monday. Arms looser after MFR techniques and gentle stretching exs. Pt demonstrated good balance with activities in gym.  OBJECTIVE IMPAIRMENTS: Abnormal gait, decreased activity tolerance, decreased balance, decreased ROM, decreased strength, increased edema, impaired sensation, improper body mechanics, and postural dysfunction.   ACTIVITY LIMITATIONS: lifting, bending, standing, sleeping, stairs, bed mobility, and locomotion level  PARTICIPATION LIMITATIONS: cleaning, laundry, shopping, and occupation  PERSONAL FACTORS: 3+ comorbidities: bilateral Breast Cancer s/p Radiation with new bone Metastasis and palliative radiation for pain  are also affecting patient's functional outcome.   REHAB POTENTIAL: Good  CLINICAL DECISION MAKING: Evolving/moderate complexity  EVALUATION COMPLEXITY: Low  GOALS: Goals reviewed with patient? Yes  SHORT TERM GOALS: Target date: 07/22/2022  Pt will be independent in a HEP for gentle lumbar/core neutral spine activities and LE flexibility Baseline: Goal status:MET 07/22/2022  2.  Pt will be able to get in and out of bed and brush teeth with good body mechanics Baseline: no knowledge Goal status:MET for in/out of bed, Met for hip hinge 09/07/2022   LONG TERM GOALS: Target date: 2/29/2023  Pt will be approved for Flexi touch Sequential compression pump for management of bilateral breast edema Baseline:  Goal status:MET  2.  Pt will have AROM for  bilateral DF improved to atleast 5 degrees for improved gait pattern Baseline:  Goal status:MET 09/07/2022 3.  Pt will be educated in proper body mechanics for home activities to prevent injury Baseline: no knowledge Goal status:In progress;improving but needs review  4.  Pt will ambulate with improved heel strike /toe off and decreased sideways translation for improved energy conservation Baseline: MET 09/06/2021 Goal status:In progress 5. Pt will be independent in and tolerant of higher level core strength/stability to decrease risk of injury  Baseline:no Knowledge  In progress; improving but needs review 6. Pt will maintain SLS bilaterally x 12-15 seconds to demonstrate functional improvement  Baseline 5 sec Right,   9 seconds Left  RE -EVAL PLAN  PT FREQUENCY: 2x/week for 2 weeks, then 1x/week x 4 weeks  PT DURATION:6 weeks  PLANNED INTERVENTIONS: Therapeutic exercises, Therapeutic activity, Neuromuscular re-education, Balance training, Patient/Family education, Self Care, Orthotic/Fit training, Aquatic Therapy, Manual lymph drainage, Manual therapy, and Re-evaluation  PLAN FOR NEXT SESSION: check cording,update HEP,order compression sleeves  April 10 Print body mechanics pictures,  cont gastroc and HS stretches, higher level core strength, STM prn for chest/muscular tightness, shoulder ROM prn, add standing theraband exs, balance activities   Waynette Buttery, PT 09/28/2022, 11:59 AM

## 2022-09-30 ENCOUNTER — Ambulatory Visit: Payer: Medicaid Other

## 2022-09-30 ENCOUNTER — Other Ambulatory Visit: Payer: Self-pay

## 2022-09-30 ENCOUNTER — Other Ambulatory Visit (HOSPITAL_COMMUNITY): Payer: Self-pay

## 2022-09-30 DIAGNOSIS — C50411 Malignant neoplasm of upper-outer quadrant of right female breast: Secondary | ICD-10-CM

## 2022-09-30 DIAGNOSIS — G893 Neoplasm related pain (acute) (chronic): Secondary | ICD-10-CM

## 2022-09-30 DIAGNOSIS — C7951 Secondary malignant neoplasm of bone: Secondary | ICD-10-CM

## 2022-09-30 DIAGNOSIS — Z515 Encounter for palliative care: Secondary | ICD-10-CM

## 2022-09-30 MED ORDER — OXYCODONE-ACETAMINOPHEN 7.5-325 MG PO TABS
1.0000 | ORAL_TABLET | Freq: Four times a day (QID) | ORAL | 0 refills | Status: DC | PRN
  Filled 2022-09-30: qty 102, 13d supply, fill #0

## 2022-09-30 MED ORDER — OXYCODONE ER 9 MG PO C12A
9.0000 mg | EXTENDED_RELEASE_CAPSULE | Freq: Two times a day (BID) | ORAL | 0 refills | Status: DC
  Filled 2022-09-30: qty 60, 30d supply, fill #0

## 2022-09-30 NOTE — Telephone Encounter (Signed)
Pt called for refill of pain medications, see new orders.

## 2022-10-01 ENCOUNTER — Other Ambulatory Visit (HOSPITAL_COMMUNITY): Payer: Self-pay

## 2022-10-04 ENCOUNTER — Other Ambulatory Visit (HOSPITAL_COMMUNITY): Payer: Self-pay

## 2022-10-05 ENCOUNTER — Ambulatory Visit: Payer: Medicaid Other

## 2022-10-06 ENCOUNTER — Other Ambulatory Visit: Payer: Self-pay

## 2022-10-08 NOTE — Progress Notes (Unsigned)
Palliative Medicine [facility]  [phone] [phone]   Name: Chelsea Hansen Date: 10/08/2022 MRN: [id number]  DOB: 01/26/1971  Patient Care Team: Grayce Sessions, NP as PCP - General (Internal Medicine) Thomasene Ripple, DO as PCP - Cardiology (Cardiology) Antony Blackbird, MD as Consulting Physician (Radiation Oncology) Abigail Miyamoto, MD as Consulting Physician (General Surgery) Serena Croissant, MD as Consulting Physician (Hematology and Oncology) [name], Arty Baumgartner, NP as Nurse Practitioner (Nurse Practitioner)   I connected with [name] on 10/08/22 at  2:30 PM EDT by phone and verified that I am speaking with the correct person using two identifiers.   I discussed the limitations, risks, security and privacy concerns of performing an evaluation and management service by telemedicine and the availability of in-person appointments. I also discussed with the patient that there may be a patient responsible charge related to this service. The patient expressed understanding and agreed to proceed.   Other persons participating in the visit and their role in the encounter: n/a   Patient's location: home   Provider's location: [facility]   Chief Complaint: f/u of symptom management    INTERVAL HISTORY: Chelsea Hansen is a 52 y.o. 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  MRI of L-spine (12/23) showed L3 pathologic fracture and significant lesion along S1 with sclerotic changes.  Palliative ask to see for symptom management and goals of care.   SOCIAL HISTORY:     reports that she has never smoked. She has never used smokeless  tobacco. She reports that she does not currently use alcohol after a past usage of about 7.0 standard drinks of alcohol per week. She reports that she does not use drugs.  ADVANCE DIRECTIVES:    CODE STATUS:   PAST MEDICAL HISTORY: Past Medical History:  Diagnosis Date   Allergy    seasonal allergies   Anxiety    on meds   Asthma    uses inhaler   Breast cancer 2012   RIGHT lumpectomy   Cancer 2022   RIGHT breast lump-dx 2022   Depression    on meds   DVT (deep venous thrombosis) 2010   after hysterectomy   Family history of uterine cancer    GERD (gastroesophageal reflux disease)    with certain foods/OTC PRN meds   Headache(784.0)    History of radiation therapy    Bilateral breast- 09/03/21-11/02/21- Dr. Antony Blackbird   Hyperlipidemia    on meds   Hypertension    on meds   SVT (supraventricular tachycardia)     ALLERGIES:  has No Known Allergies.  MEDICATIONS:  Current Outpatient Medications  Medication Sig Dispense Refill   abemaciclib (VERZENIO) 50 MG tablet Take 1 tablet (50 mg total) by mouth 2 (two) times daily. 56 tablet 3   albuterol (VENTOLIN HFA) 108 (90 Base) MCG/ACT inhaler Inhale 2 puffs by mouth into the lungs every 6 hours as needed for wheezing or shortness of breath. 18 g 2   anastrozole (ARIMIDEX) 1 MG tablet Take 1 tablet (1 mg total) by mouth daily. 90 tablet 3   calcium-vitamin D (OSCAL WITH D) 500-5 MG-MCG tablet Take 1 tablet by mouth daily with supper. 90 tablet 2   escitalopram (LEXAPRO) 20 MG  tablet Take 1 tablet (20 mg total) by mouth daily. 90 tablet 1   hydrochlorothiazide (HYDRODIURIL) 25 MG tablet Take 1 tablet (25 mg total) by mouth daily. HOLD until follow up with your primary doctor 30 tablet 0   metroNIDAZOLE (FLAGYL) 500 MG tablet Take 1 tablet (500 mg total) by mouth 2 (two) times daily. 14 tablet 0   montelukast (SINGULAIR) 10 MG tablet Take 1 tablet (10 mg total) by mouth at bedtime. 90 tablet 3   ondansetron (ZOFRAN) 4 MG  tablet Take 1 tablet (4 mg total) by mouth every 8 (eight) hours. 90 tablet 11   oxyCODONE ER 9 MG C12A Take 9 mg by mouth 2 (two) times daily. 60 capsule 0   oxyCODONE-acetaminophen (PERCOCET) 7.5-325 MG tablet Take 1 - 2 tablets by mouth every 6 (six) hours as needed for severe pain. 102 tablet 0   pantoprazole (PROTONIX) 40 MG tablet Take 1 tablet (40 mg total) by mouth daily. 30 tablet 3   polyethylene glycol (MIRALAX / GLYCOLAX) 17 g packet Take 17 g by mouth 2 (two) times daily. 60 each 3   prochlorperazine (COMPAZINE) 10 MG tablet Take 1 tablet (10 mg total) by mouth every 6 (six) hours as needed for nausea or vomiting. 30 tablet 0   propranolol (INDERAL) 10 MG tablet Take 1 tablet (10 mg total) by mouth 2 (two) times daily. 180 tablet 3   senna (SENOKOT) 8.6 MG TABS tablet Take 2 tablets (17.2 mg total) by mouth at bedtime. 60 tablet 3   No current facility-administered medications for this visit.    VITAL SIGNS: There were no vitals taken for this visit. There were no vitals filed for this visit.   Estimated body mass index is 29.01 kg/m as calculated from the following:   Height as of 08/31/22:  (1.803 m).   Weight as of 09/15/22: 208 lb (94.3 kg).   PERFORMANCE STATUS (ECOG) : 1 - Symptomatic but completely ambulatory  Assessment NAD RRR Normal breathing pattern  AAO x3  IMPRESSION:   Neoplasm related pain Kennley reports pain is controlled on current regimen. Some days are better than others however she is able to manage thru. Pain is mainly in her back and shoulder area.    We discussed at length her current regimen.  She is taking Xtampza 9 mg every 12 hours and Percocet every 6 hours as needed for breakthrough pain.  Tolerating medications without difficulyt. Taking as prescribed. Refills have been appropriate timing based on pill count. No adjustments to be made today.  We will continue to closely monitor.   Pain contract on file.  Nausea/Vomiting    Improved with antiemetics.  Constipation Improved with bowel regimen.  PLAN: Continue Xtampza 9 mg every 12 hours.    Continue Percocet every 6 hours as needed for breakthrough pain. Not requiring around the clock.  MiraLAX twice daily for bowel regimen Senna daily Protonix 40 mg daily  Patient reports pain is well-controlled. I will plan to see patient back in 3-4 weeks in collaboration with her other oncology appointments. Patient knows to contact our office sooner if needed.   Patient expressed understanding and was in agreement with this plan. She also understands that She can call the clinic at any time with any questions, concerns, or complaints.       Any controlled substances utilized were prescribed in the context of palliative care. PDMP has been reviewed.    Time Total: 20 min   Visit consisted  of counseling and education dealing with the complex and emotionally intense issues of symptom management and palliative care in the setting of serious and potentially life-threatening illness.Greater than 50%  of this time was spent counseling and coordinating care related to the above assessment and plan.  Willette Alma, AGPCNP-BC  Palliative Medicine Team/ Cancer Center

## 2022-10-11 ENCOUNTER — Encounter: Payer: Self-pay | Admitting: Nurse Practitioner

## 2022-10-11 ENCOUNTER — Inpatient Hospital Stay: Payer: Medicaid Other | Attending: Adult Health | Admitting: Nurse Practitioner

## 2022-10-11 DIAGNOSIS — C50411 Malignant neoplasm of upper-outer quadrant of right female breast: Secondary | ICD-10-CM | POA: Diagnosis not present

## 2022-10-11 DIAGNOSIS — Z515 Encounter for palliative care: Secondary | ICD-10-CM

## 2022-10-11 DIAGNOSIS — G893 Neoplasm related pain (acute) (chronic): Secondary | ICD-10-CM | POA: Diagnosis not present

## 2022-10-11 DIAGNOSIS — C7951 Secondary malignant neoplasm of bone: Secondary | ICD-10-CM | POA: Diagnosis not present

## 2022-10-11 DIAGNOSIS — R530 Neoplastic (malignant) related fatigue: Secondary | ICD-10-CM

## 2022-10-11 DIAGNOSIS — Z170 Estrogen receptor positive status [ER+]: Secondary | ICD-10-CM

## 2022-10-12 ENCOUNTER — Ambulatory Visit: Payer: Medicaid Other

## 2022-10-14 ENCOUNTER — Other Ambulatory Visit (HOSPITAL_COMMUNITY): Payer: Self-pay

## 2022-10-15 ENCOUNTER — Other Ambulatory Visit (HOSPITAL_COMMUNITY): Payer: Self-pay

## 2022-10-19 ENCOUNTER — Ambulatory Visit: Payer: Medicaid Other

## 2022-10-26 ENCOUNTER — Ambulatory Visit: Payer: Medicaid Other

## 2022-10-27 ENCOUNTER — Other Ambulatory Visit: Payer: Self-pay

## 2022-10-27 ENCOUNTER — Ambulatory Visit: Payer: Medicaid Other | Attending: Radiation Oncology

## 2022-10-27 ENCOUNTER — Other Ambulatory Visit (HOSPITAL_COMMUNITY): Payer: Self-pay

## 2022-10-27 DIAGNOSIS — C7951 Secondary malignant neoplasm of bone: Secondary | ICD-10-CM | POA: Diagnosis present

## 2022-10-27 DIAGNOSIS — G893 Neoplasm related pain (acute) (chronic): Secondary | ICD-10-CM

## 2022-10-27 DIAGNOSIS — C50412 Malignant neoplasm of upper-outer quadrant of left female breast: Secondary | ICD-10-CM | POA: Insufficient documentation

## 2022-10-27 DIAGNOSIS — Z170 Estrogen receptor positive status [ER+]: Secondary | ICD-10-CM

## 2022-10-27 DIAGNOSIS — I890 Lymphedema, not elsewhere classified: Secondary | ICD-10-CM

## 2022-10-27 DIAGNOSIS — C50411 Malignant neoplasm of upper-outer quadrant of right female breast: Secondary | ICD-10-CM | POA: Insufficient documentation

## 2022-10-27 DIAGNOSIS — Z515 Encounter for palliative care: Secondary | ICD-10-CM

## 2022-10-27 MED ORDER — OXYCODONE-ACETAMINOPHEN 7.5-325 MG PO TABS
1.0000 | ORAL_TABLET | Freq: Four times a day (QID) | ORAL | 0 refills | Status: DC | PRN
  Filled 2022-10-27: qty 102, 13d supply, fill #0

## 2022-10-27 MED ORDER — OXYCODONE ER 9 MG PO C12A
9.0000 mg | EXTENDED_RELEASE_CAPSULE | Freq: Two times a day (BID) | ORAL | 0 refills | Status: DC
  Filled 2022-10-29: qty 36, 18d supply, fill #0
  Filled 2022-10-29: qty 24, 12d supply, fill #0
  Filled ????-??-?? (×2): fill #0

## 2022-10-27 NOTE — Therapy (Signed)
OUTPATIENT PHYSICAL THERAPY ONCOLOGY EVALUATION  Patient Name: Chelsea Hansen MRN: [id number] DOB:[birth year], 52 y.o., female Today's Date: 10/27/2022  END OF SESSION:  PT End of Session - 10/27/22 1209     Visit Number 16    Number of Visits 25    Date for PT Re-Evaluation 12/08/22    Authorization Type Medicaid    Authorization - Visit Number 3    Authorization - Number of Visits 12    Progress Note Due on Visit 25    PT Start Time 1210    PT Stop Time 1303    PT Time Calculation (min) 53 min    Activity Tolerance Patient tolerated treatment well    Behavior During Therapy WFL for tasks assessed/performed             Past Medical History:  Diagnosis Date   Allergy    seasonal allergies   Anxiety    on meds   Asthma    uses inhaler   Breast cancer (HCC) 2012   RIGHT lumpectomy   Cancer (HCC) 2022   RIGHT breast lump-dx 2022   Depression    on meds   DVT (deep venous thrombosis) (HCC) 2010   after hysterectomy   Family history of uterine cancer    GERD (gastroesophageal reflux disease)    with certain foods/OTC PRN meds   Headache(784.0)    History of radiation therapy    Bilateral breast- 09/03/21-11/02/21- Dr. Antony Blackbird   Hyperlipidemia    on meds   Hypertension    on meds   SVT (supraventricular tachycardia)    Past Surgical History:  Procedure Laterality Date   ABDOMINAL HYSTERECTOMY     AXILLARY SENTINEL NODE BIOPSY Left 07/09/2021   Procedure: LEFT AXILLARY SENTINEL NODE BIOPSY;  Surgeon: Abigail Miyamoto, MD;  Location: [facility];  Service: General;  Laterality: Left;   BIOPSY  06/29/2022   Procedure: BIOPSY;  Surgeon: Sherrilyn Rist, MD;  Location: [facility];  Service: Gastroenterology;;   BREAST EXCISIONAL BIOPSY Right 09/16/2009   BREAST LUMPECTOMY WITH RADIOACTIVE SEED LOCALIZATION Bilateral 07/09/2021   Procedure: BILATERAL BREAST LUMPECTOMY WITH RADIOACTIVE SEED LOCALIZATION;  Surgeon: Abigail Miyamoto, MD;   Location: [facility];  Service: General;  Laterality: Bilateral;   BREAST SURGERY     lumpectomy   ESOPHAGOGASTRODUODENOSCOPY (EGD) WITH PROPOFOL N/A 06/29/2022   Procedure: ESOPHAGOGASTRODUODENOSCOPY (EGD) WITH PROPOFOL;  Surgeon: Sherrilyn Rist, MD;  Location: [facility];  Service: Gastroenterology;  Laterality: N/A;   PORT-A-CATH REMOVAL Left 07/09/2021   Procedure: REMOVAL PORT-A-CATH;  Surgeon: Abigail Miyamoto, MD;  Location: [facility];  Service: General;  Laterality: Left;   PORTACATH PLACEMENT Left 01/26/2021   Procedure: INSERTION PORT-A-CATH;  Surgeon: Abigail Miyamoto, MD;  Location: [facility];  Service: General;  Laterality: Left;   RADIOACTIVE SEED GUIDED AXILLARY SENTINEL LYMPH NODE Right 07/09/2021   Procedure: RADIOACTIVE SEED GUIDED RIGHT AXILLARY SENTINEL LYMPH NODE DISSECTION;  Surgeon: Abigail Miyamoto, MD;  Location:  SURGERY CENTER;  Service: General;  Laterality: Right;   TUBAL LIGATION     Patient Active Problem List   Diagnosis Date Noted   Chronic constipation 06/30/2022   Nausea and vomiting in adult 06/29/2022   Hematemesis 06/28/2022   Metastasis to bone (HCC) 05/20/2022   PSVT (paroxysmal supraventricular tachycardia) 07/08/2021   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   GERD (gastroesophageal reflux disease) 05/27/2021   Hyperlipidemia 05/27/2021   Hypertension 05/27/2021   Pain management  contract signed 05/04/2021   Chronic pain syndrome (breast cancer) 05/04/2021   Palpitations 03/25/2021   Sinus bradycardia 03/25/2021   Daytime somnolence 03/25/2021   Snoring 03/25/2021   Obesity (BMI 30-39.9) 03/25/2021   Port-A-Cath in place 01/27/2021   Genetic testing 12/24/2020   Family history of uterine cancer 12/17/2020   Malignant neoplasm of upper-outer quadrant of right breast in female, estrogen receptor positive (HCC) 12/12/2020   Malignant neoplasm of upper-outer quadrant of left breast in female,  estrogen receptor positive (HCC) 12/12/2020   Hypokalemia 01/02/2012   Asthma 01/01/2012   Bradycardia 01/01/2012   Syncope 12/31/2011   Mediastinal adenopathy 12/31/2011   Anemia 12/31/2011   Chest pain 12/31/2011    PCP: Gwinda Passe  REFERRING PROVIDER: Antony Blackbird  REFERRING DIAG: Malignant neoplasm of upper-outer quadrant of right breast in female, estrogen receptor positive (HCC)  Malignant neoplasm of upper-outer quadrant of left breast in female, estrogen receptor positive (HCC)  Lymphedema, not elsewhere classified  Metastasis to bone (HCC)  Neoplasm related pain (acute) (chronic)  THERAPY DIAG:  Malignant neoplasm of upper-outer quadrant of right breast in female, estrogen receptor positive (HCC)  Malignant neoplasm of upper-outer quadrant of left breast in female, estrogen receptor positive (HCC)  Lymphedema, not elsewhere classified  Metastasis to bone (HCC)  Neoplasm related pain (acute) (chronic)  ONSET DATE: 01/2021  Rationale for Evaluation and Treatment: Rehabilitation  SUBJECTIVE:                                                                                                                                                                                           SUBJECTIVE STATEMENT:  I went down to 3 days at work a week. I have good and bad days. I have been walking a lot and drinking more water. Not having to take too much pain medicine. I have been doing the exercises.  I have some swelling in my armpit area. I have a compression vest and I like it a lot but it doesn't help armpit area. I use my Flexi touch daily. PERTINENT HISTORY:  Neoadjuvant chemotherapy with Doxorubicin and Cyclophosphamide given x 4 beginning 01/27/2021 and completing on 03/10/2021 followed by weekly paclitaxel x 12 beginning 05/2021 07/09/2021: Right lumpectomy: Grade 2 invasive lobular cancer, 2.5 cm, LCIS, margins negative, LCIS focally at anterior margin, 1/1 lymph node  positive, ER 95%, PR 100%, HER2 negative, Ki-67 40% Left lumpectomy: High-grade DCIS: 0.7 cm, margins negative, 0/1 lymph node negative ER 95%, PR 100%. Pt now  STAGE IV Breast Cancer with bone mets throughout thoracic and lumbar regions and is pending radiation to her lumbar spine next week.  PAIN:  Are you having pain? Yes NPRS scale: 3/10 today in back, 7 in left hip but improves as I get going Pain location: LB Pain orientation: Bilateral  PAIN TYPE: aching, sharp,  Pain description:intermittent Aggravating factors: standing, sleeping, walking, stairs, housework Relieving factors: Pain meds,changing positions,  PRECAUTIONS: BONE METS currently s/p palliative radiation to spine, bilateral breast lymphedema, bilateral UE lymphedema risk  WEIGHT BEARING RESTRICTIONS: No  FALLS:  Has patient fallen in last 6 months? No  LIVING ENVIRONMENT: Lives with: currently living alone Lives ZO:XWRUE presently  Has following equipment : Single point cane, Walker - 4 wheeled, shower chair, and bed side commode  OCCUPATION: mgr at [employer]  LEISURE: shop, dominoes  HAND DOMINANCE: right   PRIOR LEVEL OF FUNCTION: Independent  PATIENT GOALS:   Get back to life, keep breast swelling down, learn exercises to strengthen my back/core,keep shoulder ROM on the right track   OBJECTIVE:  COGNITION: Overall cognitive status: Within functional limits for tasks assessed   PALPATION:   OBSERVATIONS / OTHER ASSESSMENTS: bilateral breasts with enlarged pores but improved since prior treatment session  SENSATION: Light touch: Appears intact   POSTURE: forward head, rounded shoulders  UPPER EXTREMITY AROM/PROM:  A/PROM RIGHT   eval   Shoulder extension 55  Shoulder flexion 157  Shoulder abduction 174  Shoulder internal rotation 65  Shoulder external rotation 98    (Blank rows = not tested)  A/PROM LEFT   eval  Shoulder extension 56  Shoulder flexion 156  Shoulder abduction 172   Shoulder internal rotation 65  Shoulder external rotation 95    (Blank rows = not tested)  CERVICAL AROM: All within functional limits:    LUMBAR ROM: Lumbar ROM without increasing pain; Bilateral SB decreased 25%, FB decreased 90%, BB decreased 90% all with pain  ANKLE  AROM DF : right -12,  LEFT  -8,   , Jul 22, 2022 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,  09/07/2022  Right  0             Left  2  HS Length: 30 deg Bilaterally, 07/22/2022  Left   35    Degrees     Right 45 Degrees, 09/07/2022   left 50          Right: 60  UPPER EXTREMITY STRENGTH: WFL  LOWER EXTREMITY MMT: WFL     LYMPHEDEMA ASSESSMENTS:   SURGERY TYPE/DATE: 07/09/2021 BILATERAL BREAST LUMPECTOMY WITH RADIOACTIVE SEED LOCALIZATION RADIOACTIVE SEED GUIDED RIGHT AXILLARY TARGETED LYMPH NODE DISSECTION LEFT AXILLARY SENTINEL NODE BIOPSY LEFT AXILLARY LYMPH NODE MAPPING WITH SENTIMAG    NUMBER OF LYMPH NODES REMOVED: Right 1/1, Left 0/1   CHEMOTHERAPY:  neoadjuvant    RADIATION:yes bilateral breast, recently finished palliative radiation for bone METS to spine    HORMONE TREATMENT: YES  INFECTIONS: NO  LYMPHEDEMA ASSESSMENTS:     LANDMARK RIGHT  03/04/2022 Right   10 cm proximal to olecranon process 30.5 28.8  Olecranon process 27.1 25.4  10 cm proximal to ulnar styloid process 21.2 20.9  Just proximal to ulnar styloid process 16.6 16.3  Across hand at thumb web space 21.4 20.4  At base of 2nd digit 6.8 6.7  (Blank rows = not tested)   LANDMARK LEFT  03/04/2022 LEFT  10 cm proximal to olecranon process 30.7 29.4  Olecranon process 26.7 25.8  10 cm proximal to ulnar styloid process 20.5 20.3  Just proximal to ulnar styloid process 16.3 16.15  Across hand at thumb web space 21.1  20.5  At base of 2nd digit 6.7 6.4  Blank rows = not tested)      GAIT: Distance walked: 20 feet Assistive device utilized: None Level of assistance: Complete Independence Comments: decreased heel strike, toe off,  sideways translation with some LOB  QUICK DASH SURVEY: not completed   TODAY'S TREATMENT:   10/27/2022 Checked swelling in Right axilla; compression garment does not quite cover high enough to cover the area. She will have Second to [place] check and see if there is something that comes a little higher. Educated how she can do MLD to the area to decrease swelling. Reviewed and Practiced golf and power lift x 3 ea DLS on airex Eyes closed  3 x 30 SBA Step ups forward and lateral on ax x 10 ea. Nu step:seat 10, UE 10, Lev 4 x 7 min Discussed compression sleeve. Not in yet. Will request 1 more visit to check for fit Checked goals  09/28/2022 NU step seat 10, UE 10, resistance 4 x 6 min Checked Bilateral UE;cording noted bilateral axillary through antecubital regions more palpable on right but tight bilaterally. MFR bilateral upper arms and antecubital fossa to address cords  Supine wand flexion and scaption x 3 ea Supine bilateral AROM flexion, scaption, horizontal abd x 3 ea LTR x 3 bilaterally Airex beam tandem walking 6 lengths no HH, sideways walking 6 lengths no HH Partial step ups forward on 8" step x 10  no HH, and side step ups on 6 in step x 10on Bilateral gastroc stretch on incline x 4 Pulleys flexion and abduction x 1 min ea Showed pt NTS to gently stretch bilateral UE cording   09/21/2022 Assessed swelling; Right breast with fibrosis at lateral breast and enlarged pores medially. Swelling noted distal to axillary incision. Discussed importance of wearing compression bra to keep swelling down. Pt to make appt to get another bra or 2 this week Pulleys x 2 min flexion and abduction Nustep  seat 10, UE 10, Lev 4  x6 min. Ppt x 10 Ppt with opp hand to knee x 10 ea SLR with ppt 2x5  Reviewed golfers lift x 5 ea side and power lift x 5 Measured bilateral Ue's; no sign of UE lymphedema  09/07/2022  Nu step seat 10, UE 10, lev 3 x 6 min Incline stretch 3 x 20-30 sec Sitting calf  stretch x 3, HS stretch  x 3 ea 30 sec Gait in clinic  80 ft x 2 with pt lengthening stride; improved heel strike/toe off Reviewed hip hinge for brushing teeth. Able to return demonstrated. Educated in golfers lift and power lift to protect back each x4 using bolster for power lift. Requires VC's for keeping head up with power lift. Practiced half knee to stand x 1. SLS avg Right 5 sec, Left 9 seconds Measured DF and HS ROM, and checked goals with pt. Discussed importance of daily exercises to allow her to feel better without a lot of pain meds 08/31/2022 Nustep seat 10, UE 10, Lev 3 x 6 min Incline stretch 3 x 30 sec Heel raises on ax x 15, marching on ax x 15, Marching with alt. UE raises x 10 Side stepping down and back 3 lengths ea Vector reaches x 7 ea with toe touch, 5 ea no touch. SLS right 4,5, 30 sec, Left 12,12, 22 sec Mini squats x 10 in bars Lunges x 10 ea with intermittent hand hold HS stretch with strap 3 x 30 sec  FABER  stretch supine x 3 ea  08/24/2022 Pt tried ppt but had difficulty activating Therapist demonstrated ppt on table and then pt reproduced x 10 Ppt with march 2 x 5 Ppt with alternate arms x 10 Figure 4 stretch x 3 ea 30 sec Ppt with alternate hand to knee 2x 10 Ppt with bolster squeeze x 10 SLR x 5 B with ppt SKTC stretch x 3 B x 30 Nustep seat 10, UE 10 lev 4 x 7 min, 258 steps Incline gastroc stretch x 3 x 30 min Heel toe walking length of bars x 2 and retro walking x 2 SLS bilaterally x 3 ea; held 15-20 sec without LOB 08/20/2022 Nu step seat 10, UE 10, resistance level 3 x 7;20  Marching on ax  x 10, heel raises x 15 no HH Step up  and hold on ax forward and sideways x 10 bilaterally Incline stretch x 3, 30sec Supine HS stretch 3 x 30 sec B Figure 4 stretch x 3 ea 30 sec  Alternate hand to knee with TrA x 10 SLR x 5 B with ppt SKTC stretch x 3 B x 30   06/25/2022  Evaluation only  PATIENT EDUCATION:  08/06/2022 Side Pull: Double Arm   On  back, knees bent, feet flat. Arms perpendicular to body, shoulder level, elbows straight but relaxed. Pull arms out to sides, elbows straight. Resistance band comes across collarbones, hands toward floor. Hold momentarily. Slowly return to starting position. Repeat _5-10__ times. Band color _yellow____   EDUCATION  08/10/2022 Access code;[email or web address] Bridge, ppt, ppt with march, hip adductor squeze, hip abduction  Access Code: [email or web address] URL: [email or web address] Date: 08/06/2022  Education details: discussed POC/activities that will be performedAccess Code: [email or web address] URL: [email or web address] Date: 07/22/2022 Prepared by: Alvira Monday  Exercises - Supine Hamstring Stretch with Strap  - 1 x daily - 7 x weekly - 3 sets - 10 reps, Meeks decompression x 5 Person educated: Patient Education method: Explanation Education comprehension: verbalized understanding  HOME EXERCISE PROGRAM:  Supine Hamstring Stretch with Strap  - 1 x daily - 7 x weekly - 3 sets - 10 reps, Meeks decompression x 5, supine horizontal abd with TrA activation and red band x 10, standing scapular retraction red x 10 ASSESSMENT:  CLINICAL IMPRESSION: Pt has achieved  5 of 6 goals established at initial evaluation. She is independent and compliant with a HEP and walking program. She is able to return demonstrate proper body mechanics for safety with home and work activities.She has improved ankle ROM for improving her gait. She has received her Flexi Touch sequential compression pump and is independent in its use. She is still challenged with balance activities and achieved her goal for Right SLS but not yet for Left SLS. She will continue to work on this at home. She has been fit for bilateral UE compression sleeves and gauntlets for prevention of lymphedema but has not yet received them. A new goal has been added to check pts garments for correct fit and to educate her on donning, doffing, care and  wearing of sleeves. She has visits remaining from her last renewal, however the date has expired. I would like to ask for a date extension through 12/08/2022 as I do not know when garments will arrive due to a back log since Medicare is now covering and DME suppliers have been inundated with orders.  OBJECTIVE IMPAIRMENTS: Abnormal gait, decreased activity tolerance, decreased balance, decreased ROM, decreased strength, increased edema, impaired sensation, improper body mechanics, and  postural dysfunction.   ACTIVITY LIMITATIONS: lifting, bending, standing, sleeping, stairs, bed mobility, and locomotion level  PARTICIPATION LIMITATIONS: cleaning, laundry, shopping, and occupation  PERSONAL FACTORS: 3+ comorbidities: bilateral Breast Cancer s/p Radiation with new bone Metastasis and palliative radiation for pain  are also affecting patient's functional outcome.   REHAB POTENTIAL: Good  CLINICAL DECISION MAKING: Evolving/moderate complexity  EVALUATION COMPLEXITY: Low  GOALS: Goals reviewed with patient? Yes  SHORT TERM GOALS: Target date: 07/22/2022  Pt will be independent in a HEP for gentle lumbar/core neutral spine activities and LE flexibility Baseline: Goal status:MET 07/22/2022  2.  Pt will be able to get in and out of bed and brush teeth with good body mechanics Baseline: no knowledge Goal status:MET for in/out of bed, Met for hip hinge 09/07/2022   LONG TERM GOALS: Target date: 2/29/2023  Pt will be approved for Flexi touch Sequential compression pump for management of bilateral breast edema Baseline:  Goal status:MET   2.  Pt will have AROM for bilateral DF improved to atleast 5 degrees for improved gait pattern Baseline:  Goal status:MET 09/07/2022 3.  Pt will be educated in proper body mechanics for home activities to prevent injury Baseline: no knowledge Goal status: MET  4.  Pt will ambulate with improved heel strike /toe off and decreased sideways translation for  improved energy conservation Baseline: MET 09/06/2021 Goal status:In progress 5. Pt will be independent in and tolerant of higher level core strength/stability to decrease risk of injury  Baseline:no Knowledge  MET 10/27/2022 6. Pt will maintain SLS bilaterally x 12-15 seconds to demonstrate functional improvement  Baseline 5 sec Right,   9 seconds Left  Partially achieved met Right 15 sec, not met for Left 7. Pt will receive compression garments for Bilateral UE and will be instructed in proper donning/doffing and use  Baseline;ordered not yet received  NEW to be met by 12/08/22   PLAN  PT FREQUENCY: 1-2 visits in 6 weeks after garments are received to instruct in donning, doffing, care and use of compression  PT DURATION:6 weeks  PLANNED INTERVENTIONS: Therapeutic exercises, Therapeutic activity, Neuromuscular re-education, Balance training, Patient/Family education, Self Care, Orthotic/Fit training, Aquatic Therapy, Manual lymph drainage, Manual therapy, and Re-evaluation  PLAN FOR NEXT SESSION: Request date extension through 12/08/2022 to give garments time to come in. Check sleeves for fit and educated in donning, doffing, care, use. Review exs prn, DC next visit or 2. Waynette Buttery, PT 10/27/2022, 5:13 PM

## 2022-10-27 NOTE — Telephone Encounter (Signed)
Pt called for refill, see new orders.  

## 2022-10-28 ENCOUNTER — Other Ambulatory Visit (HOSPITAL_COMMUNITY): Payer: Self-pay

## 2022-10-29 ENCOUNTER — Other Ambulatory Visit (HOSPITAL_COMMUNITY): Payer: Self-pay

## 2022-10-29 ENCOUNTER — Other Ambulatory Visit: Payer: Self-pay

## 2022-11-02 ENCOUNTER — Telehealth: Payer: Self-pay | Admitting: Hematology and Oncology

## 2022-11-02 NOTE — Telephone Encounter (Signed)
Rescheduled appointment per provider PAL. Left voicemail. 

## 2022-11-05 ENCOUNTER — Other Ambulatory Visit (HOSPITAL_COMMUNITY): Payer: Self-pay

## 2022-11-08 ENCOUNTER — Other Ambulatory Visit (HOSPITAL_COMMUNITY): Payer: Self-pay

## 2022-11-17 ENCOUNTER — Encounter (HOSPITAL_COMMUNITY)
Admission: RE | Admit: 2022-11-17 | Discharge: 2022-11-17 | Disposition: A | Discharge status: Home / Self Care | Payer: Medicaid Other | Source: Ambulatory Visit | Attending: Adult Health | Admitting: Adult Health

## 2022-11-17 ENCOUNTER — Ambulatory Visit (HOSPITAL_COMMUNITY)
Admission: RE | Admit: 2022-11-17 | Discharge: 2022-11-17 | Disposition: A | Discharge status: Home / Self Care | Payer: Medicaid Other | Source: Ambulatory Visit | Attending: Adult Health | Admitting: Adult Health

## 2022-11-17 DIAGNOSIS — C50411 Malignant neoplasm of upper-outer quadrant of right female breast: Secondary | ICD-10-CM | POA: Diagnosis present

## 2022-11-17 DIAGNOSIS — Z170 Estrogen receptor positive status [ER+]: Secondary | ICD-10-CM | POA: Insufficient documentation

## 2022-11-17 DIAGNOSIS — C7951 Secondary malignant neoplasm of bone: Secondary | ICD-10-CM

## 2022-11-17 MED ORDER — IOHEXOL 300 MG/ML  SOLN
100.0000 mL | Freq: Once | INTRAMUSCULAR | Status: AC | PRN
  Administered 2022-11-17: 100 mL via INTRAVENOUS

## 2022-11-17 MED ORDER — SODIUM CHLORIDE (PF) 0.9 % IJ SOLN
INTRAMUSCULAR | Status: AC
  Filled 2022-11-17: qty 50

## 2022-11-17 MED ORDER — TECHNETIUM TC 99M MEDRONATE IV KIT
20.0000 | PACK | Freq: Once | INTRAVENOUS | Status: AC | PRN
  Administered 2022-11-17: 20 via INTRAVENOUS

## 2022-11-18 ENCOUNTER — Other Ambulatory Visit: Payer: Self-pay

## 2022-11-18 ENCOUNTER — Other Ambulatory Visit (HOSPITAL_COMMUNITY): Payer: Self-pay

## 2022-11-19 ENCOUNTER — Other Ambulatory Visit (HOSPITAL_COMMUNITY): Payer: Self-pay

## 2022-11-23 ENCOUNTER — Inpatient Hospital Stay: Payer: Medicaid Other | Attending: Adult Health

## 2022-11-23 ENCOUNTER — Encounter: Payer: Self-pay | Admitting: Adult Health

## 2022-11-23 ENCOUNTER — Encounter: Payer: Self-pay | Admitting: Nurse Practitioner

## 2022-11-23 ENCOUNTER — Inpatient Hospital Stay (HOSPITAL_BASED_OUTPATIENT_CLINIC_OR_DEPARTMENT_OTHER): Payer: Medicaid Other | Admitting: Nurse Practitioner

## 2022-11-23 ENCOUNTER — Inpatient Hospital Stay: Payer: Medicaid Other

## 2022-11-23 ENCOUNTER — Inpatient Hospital Stay (HOSPITAL_BASED_OUTPATIENT_CLINIC_OR_DEPARTMENT_OTHER): Payer: Medicaid Other | Admitting: Adult Health

## 2022-11-23 ENCOUNTER — Other Ambulatory Visit: Payer: Self-pay

## 2022-11-23 ENCOUNTER — Other Ambulatory Visit (HOSPITAL_COMMUNITY): Payer: Self-pay

## 2022-11-23 VITALS — BP 146/76 | HR 71 | Temp 98.5°F | Resp 18 | Wt 209.5 lb

## 2022-11-23 DIAGNOSIS — Z79811 Long term (current) use of aromatase inhibitors: Secondary | ICD-10-CM | POA: Insufficient documentation

## 2022-11-23 DIAGNOSIS — Z170 Estrogen receptor positive status [ER+]: Secondary | ICD-10-CM

## 2022-11-23 DIAGNOSIS — G893 Neoplasm related pain (acute) (chronic): Secondary | ICD-10-CM | POA: Insufficient documentation

## 2022-11-23 DIAGNOSIS — K5792 Diverticulitis of intestine, part unspecified, without perforation or abscess without bleeding: Secondary | ICD-10-CM | POA: Insufficient documentation

## 2022-11-23 DIAGNOSIS — C50411 Malignant neoplasm of upper-outer quadrant of right female breast: Secondary | ICD-10-CM | POA: Diagnosis present

## 2022-11-23 DIAGNOSIS — C7951 Secondary malignant neoplasm of bone: Secondary | ICD-10-CM | POA: Insufficient documentation

## 2022-11-23 DIAGNOSIS — Z95828 Presence of other vascular implants and grafts: Secondary | ICD-10-CM

## 2022-11-23 DIAGNOSIS — R530 Neoplastic (malignant) related fatigue: Secondary | ICD-10-CM

## 2022-11-23 DIAGNOSIS — Z515 Encounter for palliative care: Secondary | ICD-10-CM

## 2022-11-23 DIAGNOSIS — C50412 Malignant neoplasm of upper-outer quadrant of left female breast: Secondary | ICD-10-CM | POA: Insufficient documentation

## 2022-11-23 DIAGNOSIS — R112 Nausea with vomiting, unspecified: Secondary | ICD-10-CM | POA: Insufficient documentation

## 2022-11-23 DIAGNOSIS — K5909 Other constipation: Secondary | ICD-10-CM | POA: Diagnosis not present

## 2022-11-23 LAB — CBC WITH DIFFERENTIAL (CANCER CENTER ONLY)
Abs Immature Granulocytes: 0.02 10*3/uL (ref 0.00–0.07)
Basophils Absolute: 0 10*3/uL (ref 0.0–0.1)
Basophils Relative: 1 %
Eosinophils Absolute: 0.1 10*3/uL (ref 0.0–0.5)
Eosinophils Relative: 2 %
HCT: 31.7 % — ABNORMAL LOW (ref 36.0–46.0)
Hemoglobin: 9.9 g/dL — ABNORMAL LOW (ref 12.0–15.0)
Immature Granulocytes: 1 %
Lymphocytes Relative: 25 %
Lymphs Abs: 0.8 10*3/uL (ref 0.7–4.0)
MCH: 25.4 pg — ABNORMAL LOW (ref 26.0–34.0)
MCHC: 31.2 g/dL (ref 30.0–36.0)
MCV: 81.3 fL (ref 80.0–100.0)
Monocytes Absolute: 0.3 10*3/uL (ref 0.1–1.0)
Monocytes Relative: 9 %
Neutro Abs: 2.1 10*3/uL (ref 1.7–7.7)
Neutrophils Relative %: 62 %
Platelet Count: 243 10*3/uL (ref 150–400)
RBC: 3.9 MIL/uL (ref 3.87–5.11)
RDW: 15.4 % (ref 11.5–15.5)
WBC Count: 3.3 10*3/uL — ABNORMAL LOW (ref 4.0–10.5)
nRBC: 0 % (ref 0.0–0.2)

## 2022-11-23 LAB — CMP (CANCER CENTER ONLY)
ALT: 12 U/L (ref 0–44)
AST: 15 U/L (ref 15–41)
Albumin: 4.2 g/dL (ref 3.5–5.0)
Alkaline Phosphatase: 44 U/L (ref 38–126)
Anion gap: 7 (ref 5–15)
BUN: 15 mg/dL (ref 6–20)
CO2: 28 mmol/L (ref 22–32)
Calcium: 9.1 mg/dL (ref 8.9–10.3)
Chloride: 106 mmol/L (ref 98–111)
Creatinine: 0.83 mg/dL (ref 0.44–1.00)
GFR, Estimated: >60 mL/min (ref >60)
Glucose, Bld: 107 mg/dL — ABNORMAL HIGH (ref 70–99)
Potassium: 3.6 mmol/L (ref 3.5–5.1)
Sodium: 141 mmol/L (ref 135–145)
Total Bilirubin: 0.4 mg/dL (ref 0.3–1.2)
Total Protein: 7.3 g/dL (ref 6.5–8.1)

## 2022-11-23 MED ORDER — MOXIFLOXACIN HCL 400 MG PO TABS
400.0000 mg | ORAL_TABLET | Freq: Every day | ORAL | 0 refills | Status: DC
Start: 2022-11-23 — End: 2022-12-07
  Filled 2022-11-23: qty 10, 10d supply, fill #0

## 2022-11-23 MED ORDER — OXYCODONE ER 9 MG PO C12A
9.0000 mg | EXTENDED_RELEASE_CAPSULE | Freq: Two times a day (BID) | ORAL | 0 refills | Status: DC
Start: 2022-12-02 — End: 2023-01-04
  Filled 2022-12-02: qty 60, 30d supply, fill #0

## 2022-11-23 MED ORDER — DENOSUMAB 120 MG/1.7ML ~~LOC~~ SOLN
120.0000 mg | Freq: Once | SUBCUTANEOUS | Status: AC
  Administered 2022-11-23: 120 mg via SUBCUTANEOUS

## 2022-11-23 MED ORDER — OXYCODONE-ACETAMINOPHEN 7.5-325 MG PO TABS
1.0000 | ORAL_TABLET | Freq: Four times a day (QID) | ORAL | 0 refills | Status: DC | PRN
Start: 2022-11-23 — End: 2022-12-15
  Filled 2022-11-23: qty 102, 13d supply, fill #0

## 2022-11-23 NOTE — Assessment & Plan Note (Signed)
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  Stage IV breast cancer: She will continue on anastrozole and Xgeva.  We are going to hold the Verzenio for 2 weeks due to her imaging that demonstrates acute diverticulitis.  She has no clinical or radiographic signs of breast cancer progression and will repeat imaging in 3 to 6 months. Acute diverticulitis: Considering the fact that she has a decreased appetite and occasional nausea as well as the fact that she has recently been on metronidazole I do not believe that she will be able to tolerate Bactrim DS twice daily and metronidazole every 6 hours as recommended by the [facility] antimicrobial guide.  Instead we will use the alternative regimen with Avelox 400 mg daily for 10 days.  I gave her detailed information about diverticulitis along with information about side effects of Avelox and encouraged her to call me if she develops any of these. She will hold the Verzenio for 2 weeks and then will return to clinic in 2 weeks for labs and follow-up so we can see how she is doing and likely restart Verzenio.

## 2022-11-23 NOTE — Progress Notes (Signed)
[facility] Cancer Follow up:    Chelsea Sessions, NP [name] [place] [address]   DIAGNOSIS:  Cancer Staging  Malignant neoplasm of upper-outer quadrant of left breast in female, estrogen receptor positive (HCC) Staging form: Breast, AJCC 8th Edition - Clinical stage from 12/17/2020: Stage IA (cT1b, cN0, cM0, G2, ER+, PR+, HER2-) - Signed by Loa Socks, NP on 01/14/2022 Stage prefix: Initial diagnosis Histologic grading system: 3 grade system Laterality: Left Staged by: Pathologist and managing physician Stage used in treatment planning: Yes National guidelines used in treatment planning: Yes Type of national guideline used in treatment planning: NCCN - Pathologic stage from 07/09/2021: No Stage Recommended (ypTis (DCIS), pN0, cM0) - Signed by Loa Socks, NP on 07/22/2021 Stage prefix: Post-therapy  Malignant neoplasm of upper-outer quadrant of right breast in female, estrogen receptor positive (HCC) Staging form: Breast, AJCC 8th Edition - Clinical stage from 12/17/2020: Stage IIA (cT2, cN1, cM0, G2, ER+, PR+, HER2-) - Signed by Loa Socks, NP on 01/14/2022 Stage prefix: Initial diagnosis Histologic grading system: 3 grade system Laterality: Right Staged by: Pathologist and managing physician Stage used in treatment planning: Yes National guidelines used in treatment planning: Yes Type of national guideline used in treatment planning: NCCN - Pathologic stage from 07/09/2021: No Stage Recommended (ypT2, pN1a, cM0, G2) - Signed by Loa Socks, NP on 07/22/2021 Stage prefix: Post-therapy Histologic grading system: 3 grade system   SUMMARY OF ONCOLOGIC HISTORY: Oncology History  Malignant neoplasm of upper-outer quadrant of right breast in female, estrogen receptor positive (HCC)  12/12/2020 Initial Diagnosis   status post bilateral breast biopsies 12/03/2020, showing             (1) on the right, a  clinical T2 N1, stage IIa invasive lobular carcinoma, grade 2, estrogen and progesterone receptor strongly positive, HER2 not amplified, with an MIB-1 of 40%                         (a) the biopsied right axillary lymph node was positive with extracapsular extension                         (b) a second right breast mass also biopsied was a fibroadenoma, concordant  MAMMAPRINT tested on biopsy returned high risk, luminal type B, indicating significant benefit from chemo                         (c) biopsy of an area of non-mass-like enhancement in the upper right breast pending   12/17/2020 Cancer Staging   Staging form: Breast, AJCC 8th Edition - Clinical stage from 12/17/2020: Stage IIA (cT2, cN1, cM0, G2, ER+, PR+, HER2-) - Signed by Loa Socks, NP on 01/14/2022 Stage prefix: Initial diagnosis Histologic grading system: 3 grade system Laterality: Right Staged by: Pathologist and managing physician Stage used in treatment planning: Yes National guidelines used in treatment planning: Yes Type of national guideline used in treatment planning: NCCN   12/24/2020 Genetic Testing   Negative genetic testing:  No pathogenic variants detected on the [employer] BRCAplus panel (report date 12/24/2020) or the CancerNext-Expanded + RNAinsight panel (report date 12/31/2020). 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   The BRCAplus panel offered by [employer] and includes sequencing and deletion/duplication analysis for the following 8 genes: ATM, BRCA1, BRCA2, CDH1, CHEK2,  PALB2, PTEN, and TP53. The CancerNext-Expanded + RNAinsight gene panel offered by [employer] and includes sequencing and rearrangement analysis for the following 77 genes: AIP, ALK, APC, ATM, AXIN2, BAP1, BARD1, BLM, BMPR1A, BRCA1, BRCA2, BRIP1, CDC73, CDH1, CDK4, CDKN1B, CDKN2A, CHEK2, CTNNA1, DICER1, FANCC, FH, FLCN, GALNT12, KIF1B, LZTR1, MAX, MEN1, MET, MLH1, MSH2, MSH3,  MSH6, MUTYH, NBN, NF1, NF2, NTHL1, PALB2, PHOX2B, PMS2, POT1, PRKAR1A, PTCH1, PTEN, RAD51C, RAD51D, RB1, RECQL, RET, SDHA, SDHAF2, SDHB, SDHC, SDHD, SMAD4, SMARCA4, SMARCB1, SMARCE1, STK11, SUFU, TMEM127, TP53, TSC1, TSC2, VHL and XRCC2 (sequencing and deletion/duplication); EGFR, EGLN1, HOXB13, KIT, MITF, PDGFRA, POLD1 and POLE (sequencing only); EPCAM and GREM1 (deletion/duplication only). RNA data is routinely analyzed for use in variant interpretation for all genes.   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 Multifocal sclerotic bony metastatic disease greatest in the thoracic spine with scattered rib lesions and sclerosis as well. 2. No nodal or visceral metastasis about the chest. 3. Collateral pathways about the chest suggest some subclavian venous narrowing on the LEFT. This could also be related to arm position. Correlate clinically. 4. Signs of RIGHT breast lumpectomy. Potential postoperative changes in the LEFT breast with small focal area of  nodularity in the lateral LEFT breast which could also be postoperative, correlate clinically and consider mammographic correlation as warranted. 5. Mild cardiac enlargement.   05/18/2022 Treatment Plan Change   Anastrozole + Verzenio 100mg  PO BID   Malignant neoplasm of upper-outer quadrant of left breast in female, estrogen receptor positive (HCC)  12/12/2020 Initial Diagnosis   on the left, a clinical T1b N0, stage IA invasive ductal carcinoma, grade 1 or 2, estrogen and progesterone receptor positive, HER2 not amplified, with an MIB 1 of 15%   12/17/2020 Cancer Staging   Staging form: Breast, AJCC 8th Edition - Clinical stage from 12/17/2020: Stage IA (cT1b, cN0, cM0, G2, ER+, PR+, HER2-) - Signed by Loa Socks, NP on 01/14/2022 Stage prefix: Initial diagnosis Histologic grading system: 3 grade system Laterality: Left Staged by: Pathologist and managing physician Stage used in treatment planning: Yes National guidelines used in treatment planning: Yes Type of national guideline used in treatment planning: NCCN   12/24/2020 Genetic Testing   Negative genetic testing:  No pathogenic variants detected on the [employer] BRCAplus panel (report date 12/24/2020) or the CancerNext-Expanded + RNAinsight panel (report date 12/31/2020). 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   The BRCAplus panel offered by [employer] and includes sequencing and deletion/duplication analysis for the following 8 genes: ATM, BRCA1, BRCA2, CDH1,  CHEK2, PALB2, PTEN, and TP53. The CancerNext-Expanded + RNAinsight gene panel offered by [employer] and includes sequencing and rearrangement analysis for the following 77 genes: AIP, ALK, APC, ATM, AXIN2, BAP1, BARD1, BLM, BMPR1A, BRCA1, BRCA2, BRIP1, CDC73, CDH1, CDK4, CDKN1B, CDKN2A, CHEK2, CTNNA1, DICER1, FANCC, FH, FLCN, GALNT12, KIF1B, LZTR1, MAX, MEN1, MET, MLH1, MSH2, MSH3, MSH6, MUTYH, NBN, NF1, NF2,  NTHL1, PALB2, PHOX2B, PMS2, POT1, PRKAR1A, PTCH1, PTEN, RAD51C, RAD51D, RB1, RECQL, RET, SDHA, SDHAF2, SDHB, SDHC, SDHD, SMAD4, SMARCA4, SMARCB1, SMARCE1, STK11, SUFU, TMEM127, TP53, TSC1, TSC2, VHL and XRCC2 (sequencing and deletion/duplication); EGFR, EGLN1, HOXB13, KIT, MITF, PDGFRA, POLD1 and POLE (sequencing only); EPCAM and GREM1 (deletion/duplication only). RNA data is routinely analyzed for use in variant interpretation for all genes.   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 Multifocal sclerotic bony metastatic disease greatest in the thoracic spine with scattered rib lesions and sclerosis as well. 2. No nodal or visceral metastasis about the chest. 3. Collateral pathways about the chest suggest some subclavian venous narrowing on the LEFT. This could also be related to arm position. Correlate clinically. 4. Signs of RIGHT breast lumpectomy. Potential postoperative changes in the LEFT breast with small focal area of nodularity in the lateral LEFT breast which could also be  postoperative, correlate clinically and consider mammographic correlation as warranted. 5. Mild cardiac enlargement.   05/18/2022 Treatment Plan Change   Anastrozole + Verzenio 100mg  PO BID   06/23/2022 - 07/07/2022 Radiation Therapy   06/23/2022 through 07/07/2022 Site Technique Total Dose (Gy) Dose per Fx (Gy) Completed Fx Beam Energies  Lumbar Spine: Spine_LS_Pelv 3D 30/30 3 10/10 15X      CURRENT THERAPY: Verzenio BID, Anastrozole  INTERVAL HISTORY: Chelsea Hansen 52 y.o. female returns for follow-up of her metastatic breast cancer.  She is doing moderately well other than some abdominal discomfort and decreased appetite.  She continues to take anastrozole daily, Verzenio 50 mg p.o. twice daily and Xgeva every 12 weeks.  She denies any significant side effects other than the abdominal discomfort and appetite changes.  He underwent restaging scans with a bone scan and CT chest abdomen pelvis late last week that demonstrated no evidence of cancer progression, but incidentally noted acute diverticulitis.  She denies any fevers or chills.  She continues to have lower back pain which is located where her metastatic cancer is.  Her pain is controlled with her current pain regimen with Xtampza and Percocet.  She is seeing our palliative care nurse practitioner who is helping manage her pain.  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   GERD (gastroesophageal reflux disease) 05/27/2021   Hyperlipidemia 05/27/2021   Hypertension 05/27/2021   Pain management contract signed 05/04/2021   Chronic pain syndrome (breast cancer) 05/04/2021   Palpitations 03/25/2021   Sinus bradycardia 03/25/2021   Daytime somnolence 03/25/2021   Snoring  03/25/2021   Obesity (BMI 30-39.9) 03/25/2021   Port-A-Cath in place 01/27/2021   Genetic testing 12/24/2020   Family history of uterine cancer 12/17/2020   Malignant neoplasm of upper-outer quadrant of right breast in female, estrogen receptor positive (HCC) 12/12/2020   Malignant neoplasm of upper-outer quadrant of left breast in female, estrogen receptor positive (HCC) 12/12/2020   Hypokalemia 01/02/2012   Asthma 01/01/2012   Bradycardia 01/01/2012   Syncope 12/31/2011   Mediastinal adenopathy 12/31/2011   Anemia 12/31/2011   Chest pain 12/31/2011    has No Known Allergies.  MEDICAL HISTORY: Past Medical History:  Diagnosis Date   Allergy    seasonal allergies   Anxiety    on meds   Asthma    uses inhaler   Breast cancer (HCC) 2012   RIGHT lumpectomy   Cancer (HCC) 2022   RIGHT breast lump-dx 2022   Depression    on meds   DVT (deep venous thrombosis) (HCC) 2010   after hysterectomy   Family history of uterine cancer    GERD (gastroesophageal reflux disease)    with certain foods/OTC PRN meds   Headache(784.0)    History of radiation therapy    Bilateral breast- 09/03/21-11/02/21- Dr. Antony Blackbird   Hyperlipidemia    on meds   Hypertension    on meds   SVT (supraventricular tachycardia)     SURGICAL HISTORY: Past Surgical History:  Procedure Laterality Date   ABDOMINAL HYSTERECTOMY     AXILLARY SENTINEL NODE BIOPSY Left 07/09/2021   Procedure: LEFT AXILLARY SENTINEL NODE BIOPSY;  Surgeon: Abigail Miyamoto, MD;  Location: [facility];  Service: General;  Laterality: Left;   BIOPSY  06/29/2022   Procedure: BIOPSY;  Surgeon: Sherrilyn Rist, MD;  Location: [facility];  Service: Gastroenterology;;   BREAST EXCISIONAL BIOPSY Right 09/16/2009   BREAST LUMPECTOMY WITH RADIOACTIVE SEED LOCALIZATION Bilateral 07/09/2021   Procedure: BILATERAL BREAST LUMPECTOMY WITH RADIOACTIVE SEED LOCALIZATION;  Surgeon: Abigail Miyamoto, MD;  Location: [facility];  Service: General;  Laterality: Bilateral;   BREAST SURGERY     lumpectomy   ESOPHAGOGASTRODUODENOSCOPY (EGD) WITH PROPOFOL N/A 06/29/2022   Procedure: ESOPHAGOGASTRODUODENOSCOPY (EGD) WITH PROPOFOL;  Surgeon: Sherrilyn Rist, MD;  Location: [facility];  Service: Gastroenterology;  Laterality: N/A;   PORT-A-CATH REMOVAL Left 07/09/2021   Procedure: REMOVAL PORT-A-CATH;  Surgeon: Abigail Miyamoto, MD;  Location: [facility];  Service: General;  Laterality: Left;   PORTACATH PLACEMENT Left 01/26/2021   Procedure: INSERTION PORT-A-CATH;  Surgeon: Abigail Miyamoto, MD;  Location: [facility];  Service: General;  Laterality: Left;   RADIOACTIVE SEED GUIDED AXILLARY SENTINEL LYMPH NODE Right 07/09/2021   Procedure: RADIOACTIVE SEED GUIDED RIGHT AXILLARY SENTINEL LYMPH NODE DISSECTION;  Surgeon: Abigail Miyamoto, MD;  Location: [facility];  Service: General;  Laterality: Right;   TUBAL LIGATION      SOCIAL HISTORY: Social History   Socioeconomic History   Marital status: Single    Spouse name: Not on file  Number of children: Not on file   Years of education: Not on file   Highest education level: Not on file  Occupational History   Not on file  Tobacco Use   Smoking status: Never   Smokeless tobacco: Never  Vaping Use   Vaping Use: Never used  Substance and Sexual Activity   Alcohol use: Not Currently    Alcohol/week: 7.0 standard drinks of alcohol    Types: 7 Standard drinks or equivalent per week    Comment: occassional   Drug use: No   Sexual activity: Not Currently  Other Topics Concern   Not on file  Social History Narrative   Not on file   Social Determinants of Health   Financial Resource Strain: High Risk (01/14/2022)   Overall Financial Resource Strain (CARDIA)    Difficulty of Paying Living Expenses: Very hard  Food Insecurity: Food Insecurity Present (01/14/2022)   Hunger Vital Sign    Worried About Running Out of Food in  the Last Year: Sometimes true    Ran Out of Food in the Last Year: Sometimes true  Transportation Needs: No Transportation Needs (12/02/2020)   PRAPARE - Administrator, Civil Service (Medical): No    Lack of Transportation (Non-Medical): No  Physical Activity: Not on file  Stress: Not on file  Social Connections: Not on file  Intimate Partner Violence: Not on file    FAMILY HISTORY: Family History  Problem Relation Age of Onset   Breast cancer Mother        21s   Hypertension Father    Uterine cancer Maternal Aunt    Ovarian cancer Maternal Aunt    Breast cancer Maternal Aunt        76s   Breast cancer Cousin 77   Uterine cancer Cousin 4   Colon polyps Neg Hx    Colon cancer Neg Hx    Esophageal cancer Neg Hx    Stomach cancer Neg Hx     Review of Systems  Constitutional:  Positive for appetite change and fatigue. Negative for chills, fever and unexpected weight change.  HENT:   Negative for hearing loss, lump/mass and trouble swallowing.   Eyes:  Negative for eye problems and icterus.  Respiratory:  Negative for chest tightness, cough and shortness of breath.   Cardiovascular:  Negative for chest pain, leg swelling and palpitations.  Gastrointestinal:  Positive for abdominal pain. Negative for abdominal distention, constipation, diarrhea, nausea and vomiting.  Endocrine: Negative for hot flashes.  Genitourinary:  Negative for difficulty urinating.   Musculoskeletal:  Negative for arthralgias.  Skin:  Negative for itching and rash.  Neurological:  Negative for dizziness, extremity weakness, headaches and numbness.  Hematological:  Negative for adenopathy. Does not bruise/bleed easily.  Psychiatric/Behavioral:  Negative for depression. The patient is not nervous/anxious.       PHYSICAL EXAMINATION   Onc Performance Status - 11/23/22 0900       ECOG Perf Status   ECOG Perf Status Fully active, able to carry on all pre-disease performance without  restriction      KPS SCALE   KPS % SCORE Normal, no compliants, no evidence of disease             Vitals:   11/23/22 0921  BP: (!) 146/76  Pulse: 71  Resp: 18  Temp: 98.5 F (36.9 C)  SpO2: 100%    Physical Exam Constitutional:      General: She is not in acute distress.  Appearance: Normal appearance. She is not toxic-appearing.  HENT:     Head: Normocephalic and atraumatic.     Mouth/Throat:     Mouth: Mucous membranes are moist.     Pharynx: Oropharynx is clear. No oropharyngeal exudate or posterior oropharyngeal erythema.  Eyes:     General: No scleral icterus. Cardiovascular:     Rate and Rhythm: Normal rate and regular rhythm.     Pulses: Normal pulses.     Heart sounds: Normal heart sounds.  Pulmonary:     Effort: Pulmonary effort is normal.     Breath sounds: Normal breath sounds.  Abdominal:     General: Abdomen is flat. Bowel sounds are normal. There is no distension.     Palpations: Abdomen is soft.     Tenderness: There is no abdominal tenderness.  Musculoskeletal:        General: No swelling.     Cervical back: Neck supple.  Lymphadenopathy:     Cervical: No cervical adenopathy.  Skin:    General: Skin is warm and dry.     Findings: No rash.  Neurological:     General: No focal deficit present.     Mental Status: She is alert.  Psychiatric:        Mood and Affect: Mood normal.        Behavior: Behavior normal.     LABORATORY DATA:  CBC    Component Value Date/Time   WBC 3.3 (L) 11/23/2022 0904   WBC 2.1 (L) 06/30/2022 1149   RBC 3.90 11/23/2022 0904   HGB 9.9 (L) 11/23/2022 0904   HGB 11.5 10/06/2020 1432   HCT 31.7 (L) 11/23/2022 0904   HCT 36.8 10/06/2020 1432   PLT 243 11/23/2022 0904   PLT 323 10/06/2020 1432   MCV 81.3 11/23/2022 0904   MCV 73 (L) 10/06/2020 1432   MCH 25.4 (L) 11/23/2022 0904   MCHC 31.2 11/23/2022 0904   RDW 15.4 11/23/2022 0904   RDW 15.9 (H) 10/06/2020 1432   LYMPHSABS 0.8 11/23/2022 0904    LYMPHSABS 1.9 10/06/2020 1432   MONOABS 0.3 11/23/2022 0904   EOSABS 0.1 11/23/2022 0904   EOSABS 0.1 10/06/2020 1432   BASOSABS 0.0 11/23/2022 0904   BASOSABS 0.0 10/06/2020 1432    CMP     Component Value Date/Time   NA 141 11/23/2022 0904   NA 139 10/06/2020 1432   K 3.6 11/23/2022 0904   CL 106 11/23/2022 0904   CO2 28 11/23/2022 0904   GLUCOSE 107 (H) 11/23/2022 0904   BUN 15 11/23/2022 0904   BUN 11 10/06/2020 1432   CREATININE 0.83 11/23/2022 0904   CALCIUM 9.1 11/23/2022 0904   PROT 7.3 11/23/2022 0904   PROT 6.9 10/06/2020 1432   ALBUMIN 4.2 11/23/2022 0904   ALBUMIN 4.1 10/06/2020 1432   AST 15 11/23/2022 0904   ALT 12 11/23/2022 0904   ALKPHOS 44 11/23/2022 0904   BILITOT 0.4 11/23/2022 0904   GFRNONAA >60 11/23/2022 0904   GFRAA >60 09/07/2019 1340        ASSESSMENT and THERAPY PLAN:   Malignant neoplasm of upper-outer quadrant of left breast in female, estrogen receptor positive (HCC) Chelsea Hansen is a 52 year old woman with 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  Stage IV breast cancer: She will continue on anastrozole and Xgeva.  We are going to hold the Verzenio for 2 weeks due to her imaging that demonstrates acute diverticulitis.  She  has no clinical or radiographic signs of breast cancer progression and will repeat imaging in 3 to 6 months. Acute diverticulitis: Considering the fact that she has a decreased appetite and occasional nausea as well as the fact that she has recently been on metronidazole I do not believe that she will be able to tolerate Bactrim DS twice daily and metronidazole every 6 hours as recommended by the [facility] antimicrobial guide.  Instead we will use the alternative regimen with Avelox 400 mg daily for 10 days.  I gave her detailed information about diverticulitis along with information about side effects of Avelox and encouraged her to call me if she develops any of  these. She will hold the Verzenio for 2 weeks and then will return to clinic in 2 weeks for labs and follow-up so we can see how she is doing and likely restart Verzenio.    All questions were answered. The patient knows to call the clinic with any problems, questions or concerns. We can certainly see the patient much sooner if necessary.  Total encounter time:45 minutes*in face-to-face visit time, chart review, lab review, care coordination, order entry, and documentation of the encounter time.    Lillard Anes, NP 11/23/22 10:18 AM Medical Oncology and Hematology [facility] [address] [place], [place] [address] Tel. [phone]    Fax. [phone]  *Total Encounter Time as defined by the Centers for Medicare and Medicaid Services includes, in addition to the face-to-face time of a patient visit (documented in the note above) non-face-to-face time: obtaining and reviewing outside history, ordering and reviewing medications, tests or procedures, care coordination (communications with other health care professionals or caregivers) and documentation in the medical record.

## 2022-11-23 NOTE — Patient Instructions (Signed)
Moxifloxacin Tablets What is this medication? MOXIFLOXACIN (mox i FLOX a sin) treats infections caused by bacteria. It belongs to a group of medications called quinolone antibiotics. It will not treat colds, the flu, or infections caused by viruses. This medicine may be used for other purposes; ask your health care provider or pharmacist if you have questions. COMMON BRAND NAME(S): Avelox, Avelox ABC Pack What should I tell my care team before I take this medication? They need to know if you have any of these conditions: Bone, joint, or tendon problems Diabetes Heart disease History of irregular heartbeat or rhythm Low levels of potassium in the blood Kidney disease Liver disease Myasthenia gravis Seizures Tingling of the fingers or toes or other nerve disorder An unusual or allergic reaction to moxifloxacin, other medications, foods, dyes, or preservatives Pregnant or trying to get pregnant Breastfeeding How should I use this medication? Take this medication by mouth with a full glass of water. Take it as directed on the prescription label at the same time every day. You can take it with or without food. If it upsets your stomach, take it with food. Take all of this medication unless your care team tells you to stop it early. Keep taking it even if you think you are better. Take antacids, vitamins, or other products that contain aluminum, calcium, iron, magnesium, and zinc in them 4 hours BEFORE or 8 hours AFTER this medication. A special MedGuide will be given to you by the pharmacist with each prescription and refill. Be sure to read this information carefully each time. Talk to your care team about the use of this medication in children. Special care may be needed. Overdosage: If you think you have taken too much of this medicine contact a poison control center or emergency room at once. NOTE: This medicine is only for you. Do not share this medicine with others. What if I miss a  dose? If you miss a dose, take it as soon as you can. If it is almost time for your next dose, take only that dose. Do not take double or extra doses. What may interact with this medication? Do not take this medication with any of the following: Cisapride Dronedarone Pimozide Thioridazine This medication may also interact with the following: Antacids Certain medications for diabetes, such as glipizide, glyburide, insulin Certain medications that treat or prevent blood clots, such as warfarin Didanosine buffered tablets or powder Estrogen or progestin hormones NSAIDs, medications for pain and inflammation, such as ibuprofen or naproxen Multivitamins Other medications that cause heart rhythm changes, such as dofetilide, ziprasidone Sucralfate This list may not describe all possible interactions. Give your health care provider a list of all the medicines, herbs, non-prescription drugs, or dietary supplements you use. Also tell them if you smoke, drink alcohol, or use illegal drugs. Some items may interact with your medicine. What should I watch for while using this medication? Visit your care team for regular checks on your progress. Tell your care team if your symptoms do not start to get better or if they get worse. This medication may affect your coordination, reaction time, or judgment. Do not drive or operate machinery until you know how this medication affects you. Sit up or stand slowly to reduce the risk of dizzy or fainting spells. Drinking alcohol with this medication can increase the risk of these side effects. Do not treat diarrhea with over the counter products. Contact your care team if you have diarrhea that lasts more than 2  days or if it is severe and watery. This medication can make you more sensitive to the sun. Keep out of the sun. If you cannot avoid being in the sun, wear protective clothing and sunscreen. Do not use sun lamps, tanning beds, or tanning booths. This  medication may cause tendon problems. Tendons are the cords of tissue that connect your muscles to your bones. Tell your care team right away if you have pain, swelling, or stiffness while you are taking this medication or after you have stopped treatment. The risk is higher in people older than 51 years of age, those taking steroid medications, and those who have had a kidney, heart, or lung transplant. This medication may worsen muscle weakness in people with myasthenia gravis. This can cause breathing problems. Call your care team right away if you have myasthenia gravis and have worsening symptoms while taking this medication. This medication may cause serious skin reactions. They can happen weeks to months after starting the medication. Contact your care team right away if you notice fevers or flu-like symptoms with a rash. The rash may be red or purple and then turn into blisters or peeling of the skin. You may also notice a red rash with swelling of the face, lips, or lymph nodes in your neck or under your arms. Tell your care team if you are taking medications to treat diabetes. This medication may cause changes to blood sugar levels. Talk to your care team about how often to check your blood sugar while taking this medication. Know the symptoms of low blood sugar and how to treat it. What side effects may I notice from receiving this medication? Side effects that you should report to your care team as soon as possible: Allergic reactions--skin rash, itching, hives, swelling of the face, lips, tongue, or throat Heart rhythm changes--fast or irregular heartbeat, dizziness, feeling faint or lightheaded, chest pain, trouble breathing Increased pressure around the brain--severe headache, blurry vision, change in vision, nausea, vomiting Joint, muscle, or tendon pain, swelling, or stiffness Liver injury--right upper belly pain, loss of appetite, nausea, light-colored stool, dark yellow or brown urine,  yellowing skin or eyes, unusual weakness or fatigue Mood and behavior changes--anxiety, nervousness, confusion, hallucinations, irritability, hostility, thoughts of suicide or self-harm, worsening mood, feelings of depression Pain, tingling, or numbness in the hands or feet Redness, blistering, peeling, or loosening of the skin, including inside the mouth Seizures Severe diarrhea, fever Sudden or severe chest, back, or stomach pain Unusual vaginal discharge, itching, or odor Side effects that usually do not require medical attention (report these to your care team if they continue or are bothersome): Diarrhea Dizziness Headache Nausea Skin reactions on sun-exposed areas Vomiting This list may not describe all possible side effects. Call your doctor for medical advice about side effects. You may report side effects to FDA at 1-800-FDA-1088. Where should I keep my medication? Keep out of the reach of children and pets. Store between 15 and 30 degrees C (59 and 86 degrees F). Get rid of any unused medication after the expiration date. To get rid of medications that are no longer needed or have expired: Take the medication to a medication take-back program. Check with your pharmacy or law enforcement to find a location. If you cannot return the medication, check the label or package insert to see if the medication should be thrown out in the garbage or flushed down the toilet. If you are not sure, ask your care team. If it is  safe to put it in the trash, empty the medication out of the container. Mix the medication with cat litter, dirt, coffee grounds, or other unwanted substance. Seal the mixture in a bag or container. Put it in the trash. NOTE: This sheet is a summary. It may not cover all possible information. If you have questions about this medicine, talk to your doctor, pharmacist, or health care provider.  2024 Elsevier/Gold Standard (2022-08-15 00:00:00) Diverticulitis  Diverticulitis  is when small pouches in your colon get infected or swollen. This causes pain in your belly (abdomen) and watery poop (diarrhea). The small pouches are called diverticula. They may form if you have a condition called diverticulosis. What are the causes? You may get this condition if poop (stool) gets trapped in the pouches in your colon. The poop lets germs (bacteria) grow. This causes an infection. What increases the risk? You are more likely to get this condition if you have small pouches in your colon. You are also more likely to get it if: You are overweight or very overweight (obese). You do not exercise enough. You drink alcohol. You smoke. You eat a lot of red meat, like beef, pork, or lamb. You do not eat enough fiber. You are older than 51 years of age. What are the signs or symptoms? Pain in your belly. Pain is often on the left side, but it may be felt in other spots too. Fever and chills. Feeling like you may vomit. Vomiting. Having cramps. Feeling full. Changes in how often you poop. Blood in your poop. How is this treated? Most cases are treated at home. You may be told to: Take over-the-counter pain medicines. Only eat and drink clear liquids. Take antibiotics. Rest. Very bad cases may need to be treated at a hospital. Treatment may include: Not eating or drinking. Taking pain medicines. Getting antibiotics through an IV tube. Getting fluid and food through an IV tube. Having surgery. When you are feeling better, you may need to have a test to look at your colon (colonoscopy). Follow these instructions at home: Medicines Take over-the-counter and prescription medicines only as told by your doctor. These include: Fiber pills. Probiotics. Medicines to make your poop soft (stool softeners). If you were prescribed antibiotics, take them as told by your doctor. Do not stop taking them even if you start to feel better. Ask your doctor if you should avoid driving or  using machines while you are taking your medicine. Eating and drinking  Follow the diet told by your doctor. You may need to only eat and drink liquids. When you feel better, you may be able to eat more foods. You may also be told to eat a lot of fiber. Fiber helps you poop. Foods with fiber include berries, beans, lentils, and green vegetables. Try not to eat red meat. General instructions Do not smoke or use any products that contain nicotine or tobacco. If you need help quitting, ask your doctor. Exercise 3 or more times a week. Try to go for 30 minutes each time. Exercise enough to sweat and make your heart beat faster. Contact a doctor if: Your pain gets worse. You are not pooping like normal. Your symptoms do not get better. Your symptoms get worse very fast. You have a fever. You vomit more than one time. You have poop that is: Bloody. Black. Tarry. This information is not intended to replace advice given to you by your health care provider. Make sure you discuss any questions you  have with your health care provider. Document Revised: 03/04/2022 Document Reviewed: 03/04/2022 Elsevier Patient Education  2024 [employer].

## 2022-11-23 NOTE — Progress Notes (Signed)
Palliative Medicine [facility]  [phone] [phone]) [phone]   Name: Chelsea Hansen Date: 11/23/2022 MRN: [id number]  DOB: 07/15/1970  Patient Care Team: Grayce Sessions, NP as PCP - General (Internal Medicine) Thomasene Ripple, DO as PCP - Cardiology (Cardiology) Antony Blackbird, MD as Consulting Physician (Radiation Oncology) Abigail Miyamoto, MD as Consulting Physician (General Surgery) Serena Croissant, MD as Consulting Physician (Hematology and Oncology) [name], Arty Baumgartner, NP as Nurse Practitioner (Nurse Practitioner)   INTERVAL HISTORY: Chelsea Hansen is a 52 y.o. 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  MRI of L-spine (12/23) showed L3 pathologic fracture and significant lesion along S1 with sclerotic changes.  Palliative ask to see for symptom management and goals of care.   SOCIAL HISTORY:     reports that she has never smoked. She has never used smokeless tobacco. She reports that she does not currently use alcohol after a past usage of about 7.0 standard drinks of alcohol per week. She reports that she does not use drugs.  ADVANCE DIRECTIVES:    CODE STATUS:   PAST MEDICAL HISTORY: Past Medical History:  Diagnosis Date   Allergy    seasonal allergies   Anxiety    on meds   Asthma    uses inhaler   Breast cancer (HCC) 2012   RIGHT lumpectomy   Cancer (HCC) 2022   RIGHT breast lump-dx 2022   Depression    on meds   DVT (deep venous thrombosis) (HCC) 2010   after hysterectomy   Family history of uterine cancer    GERD (gastroesophageal reflux disease)    with certain foods/OTC PRN meds   Headache(784.0)     History of radiation therapy    Bilateral breast- 09/03/21-11/02/21- Dr. Antony Blackbird   Hyperlipidemia    on meds   Hypertension    on meds   SVT (supraventricular tachycardia)     ALLERGIES:  has No Known Allergies.  MEDICATIONS:  Current Outpatient Medications  Medication Sig Dispense Refill   abemaciclib (VERZENIO) 50 MG tablet Take 1 tablet (50 mg total) by mouth 2 (two) times daily. 56 tablet 3   albuterol (VENTOLIN HFA) 108 (90 Base) MCG/ACT inhaler Inhale 2 puffs by mouth into the lungs every 6 hours as needed for wheezing or shortness of breath. 18 g 2   anastrozole (ARIMIDEX) 1 MG tablet Take 1 tablet (1 mg total) by mouth daily. 90 tablet 3   calcium-vitamin D (OSCAL WITH D) 500-5 MG-MCG tablet Take 1 tablet by mouth daily with supper. 90 tablet 2   escitalopram (LEXAPRO) 20 MG tablet Take 1 tablet (20 mg total) by mouth daily. 90 tablet 1   hydrochlorothiazide (HYDRODIURIL) 25 MG tablet Take 1 tablet (25 mg total) by mouth daily. HOLD until follow up with your primary doctor 30 tablet 0   metroNIDAZOLE (FLAGYL) 500 MG tablet Take 1 tablet (500 mg total) by mouth 2 (two) times daily. 14 tablet 0   montelukast (SINGULAIR) 10 MG tablet Take 1 tablet (10 mg total) by mouth at bedtime. 90 tablet 3   ondansetron (ZOFRAN) 4 MG tablet Take 1 tablet (4 mg total) by mouth every 8 (eight) hours. 90 tablet 11   oxyCODONE ER 9 MG C12A Take 1  capsule by mouth 2 times daily. 60 capsule 0   oxyCODONE-acetaminophen (PERCOCET) 7.5-325 MG tablet Take 1 - 2 tablets by mouth every 6 (six) hours as needed for severe pain. 102 tablet 0   pantoprazole (PROTONIX) 40 MG tablet Take 1 tablet (40 mg total) by mouth daily. 30 tablet 3   polyethylene glycol (MIRALAX / GLYCOLAX) 17 g packet Take 17 g by mouth 2 (two) times daily. 60 each 3   prochlorperazine (COMPAZINE) 10 MG tablet Take 1 tablet (10 mg total) by mouth every 6 (six) hours as needed for nausea or vomiting. 30 tablet 0   propranolol (INDERAL)  10 MG tablet Take 1 tablet (10 mg total) by mouth 2 (two) times daily. 180 tablet 3   senna (SENOKOT) 8.6 MG TABS tablet Take 2 tablets (17.2 mg total) by mouth at bedtime. 60 tablet 3   No current facility-administered medications for this visit.    VITAL SIGNS: There were no vitals taken for this visit. There were no vitals filed for this visit.   Estimated body mass index is 29.01 kg/m as calculated from the following:   Height as of 08/31/22: 5\' 11"  (1.803 m).   Weight as of 09/15/22: 208 lb (94.3 kg).   PERFORMANCE STATUS (ECOG) : 1 - Symptomatic but completely ambulatory  Assessment NAD RRR Normal breathing pattern  AAO x4  IMPRESSION: Chelsea Hansen presents to clinic today for follow-up. No acute distress noted. Her son is present with her today. She is trying to remain as active as possible. Shares she has dropped her work schedule down to 3 days/week. Denies nausea, vomiting, constipation, or diarrhea.   Neoplasm related pain Chelsea Hansen reports pain is controlled on current regimen. Some days continue to be better than others. Some improvement now that she is not up on her feet constantly working.   We discussed at length her current regimen.  She is taking Xtampza 9 mg every 12 hours and Percocet every 6 hours as needed for breakthrough pain.  Tolerating medications without difficulty. Taking as prescribed. Refills have been appropriate timing based on pill count. No adjustments to be made today.  We will continue to closely monitor.   Pain contract on file.  Nausea/Vomiting  Resolved  Constipation Improved with bowel regimen.  PLAN: Continue Xtampza 9 mg every 12 hours.    Continue Percocet every 6 hours as needed for breakthrough pain. Not requiring around the clock.  MiraLAX twice daily for bowel regimen Senna daily Protonix 40 mg daily  Patient reports pain is well-controlled. I will plan to see patient back in 4-6 weeks in collaboration with her other oncology  appointments. Patient knows to contact our office sooner if needed.   Patient expressed understanding and was in agreement with this plan. She also understands that She can call the clinic at any time with any questions, concerns, or complaints.     Any controlled substances utilized were prescribed in the context of palliative care. PDMP has been reviewed.    Visit consisted of counseling and education dealing with the complex and emotionally intense issues of symptom management and palliative care in the setting of serious and potentially life-threatening illness.Greater than 50%  of this time was spent counseling and coordinating care related to the above assessment and plan.  Willette Alma, AGPCNP-BC  Palliative Medicine Team/[facility]  *Please note that this is a verbal dictation therefore any spelling or grammatical errors are due to the "Dragon Medical One" system interpretation.

## 2022-11-25 ENCOUNTER — Ambulatory Visit: Payer: Medicaid Other

## 2022-11-26 ENCOUNTER — Other Ambulatory Visit (HOSPITAL_COMMUNITY): Payer: Self-pay

## 2022-12-01 ENCOUNTER — Ambulatory Visit: Payer: Medicaid Other | Attending: Radiation Oncology

## 2022-12-01 ENCOUNTER — Encounter: Payer: Self-pay | Admitting: Adult Health

## 2022-12-01 DIAGNOSIS — Z170 Estrogen receptor positive status [ER+]: Secondary | ICD-10-CM | POA: Diagnosis present

## 2022-12-01 DIAGNOSIS — C7951 Secondary malignant neoplasm of bone: Secondary | ICD-10-CM

## 2022-12-01 DIAGNOSIS — C50411 Malignant neoplasm of upper-outer quadrant of right female breast: Secondary | ICD-10-CM | POA: Insufficient documentation

## 2022-12-01 DIAGNOSIS — C50412 Malignant neoplasm of upper-outer quadrant of left female breast: Secondary | ICD-10-CM | POA: Insufficient documentation

## 2022-12-01 DIAGNOSIS — I890 Lymphedema, not elsewhere classified: Secondary | ICD-10-CM

## 2022-12-01 DIAGNOSIS — G893 Neoplasm related pain (acute) (chronic): Secondary | ICD-10-CM | POA: Diagnosis present

## 2022-12-01 NOTE — Therapy (Addendum)
OUTPATIENT PHYSICAL THERAPY ONCOLOGY EVALUATION  Patient Name: Chelsea Hansen MRN: [id number] DOB:[birth year], 52 y.o., female Today's Date: 12/01/2022  END OF SESSION:  PT End of Session - 12/01/22 1152     Visit Number 17    Number of Visits 25    Date for PT Re-Evaluation 12/08/22    Authorization Type Medicaid    Authorization Time Period 11/01/22-12/12/22    PT Start Time 1200    PT Stop Time 1245    PT Time Calculation (min) 45 min    Activity Tolerance Patient tolerated treatment well    Behavior During Therapy [facility] for tasks assessed/performed             Past Medical History:  Diagnosis Date   Allergy    seasonal allergies   Anxiety    on meds   Asthma    uses inhaler   Breast cancer (HCC) 2012   RIGHT lumpectomy   Cancer (HCC) 2022   RIGHT breast lump-dx 2022   Depression    on meds   DVT (deep venous thrombosis) (HCC) 2010   after hysterectomy   Family history of uterine cancer    GERD (gastroesophageal reflux disease)    with certain foods/OTC PRN meds   Headache(784.0)    History of radiation therapy    Bilateral breast- 09/03/21-11/02/21- Dr. Antony Blackbird   Hyperlipidemia    on meds   Hypertension    on meds   SVT (supraventricular tachycardia)    Past Surgical History:  Procedure Laterality Date   ABDOMINAL HYSTERECTOMY     AXILLARY SENTINEL NODE BIOPSY Left 07/09/2021   Procedure: LEFT AXILLARY SENTINEL NODE BIOPSY;  Surgeon: Abigail Miyamoto, MD;  Location: [facility];  Service: General;  Laterality: Left;   BIOPSY  06/29/2022   Procedure: BIOPSY;  Surgeon: Sherrilyn Rist, MD;  Location: [facility];  Service: Gastroenterology;;   BREAST EXCISIONAL BIOPSY Right 09/16/2009   BREAST LUMPECTOMY WITH RADIOACTIVE SEED LOCALIZATION Bilateral 07/09/2021   Procedure: BILATERAL BREAST LUMPECTOMY WITH RADIOACTIVE SEED LOCALIZATION;  Surgeon: Abigail Miyamoto, MD;  Location: [facility];  Service: General;  Laterality:  Bilateral;   BREAST SURGERY     lumpectomy   ESOPHAGOGASTRODUODENOSCOPY (EGD) WITH PROPOFOL N/A 06/29/2022   Procedure: ESOPHAGOGASTRODUODENOSCOPY (EGD) WITH PROPOFOL;  Surgeon: Sherrilyn Rist, MD;  Location: [facility];  Service: Gastroenterology;  Laterality: N/A;   PORT-A-CATH REMOVAL Left 07/09/2021   Procedure: REMOVAL PORT-A-CATH;  Surgeon: Abigail Miyamoto, MD;  Location: [facility];  Service: General;  Laterality: Left;   PORTACATH PLACEMENT Left 01/26/2021   Procedure: INSERTION PORT-A-CATH;  Surgeon: Abigail Miyamoto, MD;  Location: [facility];  Service: General;  Laterality: Left;   RADIOACTIVE SEED GUIDED AXILLARY SENTINEL LYMPH NODE Right 07/09/2021   Procedure: RADIOACTIVE SEED GUIDED RIGHT AXILLARY SENTINEL LYMPH NODE DISSECTION;  Surgeon: Abigail Miyamoto, MD;  Location: [facility];  Service: General;  Laterality: Right;   TUBAL LIGATION     Patient Active Problem List   Diagnosis Date Noted   Chronic constipation 06/30/2022   Nausea and vomiting in adult 06/29/2022   Hematemesis 06/28/2022   Metastasis to bone (HCC) 05/20/2022   PSVT (paroxysmal supraventricular tachycardia) 07/08/2021   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   GERD (gastroesophageal reflux disease) 05/27/2021   Hyperlipidemia 05/27/2021   Hypertension 05/27/2021   Pain management contract signed 05/04/2021   Chronic pain syndrome (breast cancer) 05/04/2021   Palpitations 03/25/2021   Sinus bradycardia  03/25/2021   Daytime somnolence 03/25/2021   Snoring 03/25/2021   Obesity (BMI 30-39.9) 03/25/2021   Port-A-Cath in place 01/27/2021   Genetic testing 12/24/2020   Family history of uterine cancer 12/17/2020   Malignant neoplasm of upper-outer quadrant of right breast in female, estrogen receptor positive (HCC) 12/12/2020   Malignant neoplasm of upper-outer quadrant of left breast in female, estrogen receptor positive (HCC) 12/12/2020   Hypokalemia 01/02/2012    Asthma 01/01/2012   Bradycardia 01/01/2012   Syncope 12/31/2011   Mediastinal adenopathy 12/31/2011   Anemia 12/31/2011   Chest pain 12/31/2011    PCP: Gwinda Passe  REFERRING PROVIDER: Antony Blackbird  REFERRING DIAG: Malignant neoplasm of upper-outer quadrant of right breast in female, estrogen receptor positive (HCC)  Malignant neoplasm of upper-outer quadrant of left breast in female, estrogen receptor positive (HCC)  Lymphedema, not elsewhere classified  Metastasis to bone (HCC)  Neoplasm related pain (acute) (chronic)  THERAPY DIAG:  Malignant neoplasm of upper-outer quadrant of right breast in female, estrogen receptor positive (HCC)  Malignant neoplasm of upper-outer quadrant of left breast in female, estrogen receptor positive (HCC)  Lymphedema, not elsewhere classified  Metastasis to bone (HCC)  Neoplasm related pain (acute) (chronic)  ONSET DATE: 01/2021  Rationale for Evaluation and Treatment: Rehabilitation  SUBJECTIVE:                                                                                                                                                                                           SUBJECTIVE STATEMENT:   Working 3 days and just got off. I use my Flexi touch most every day. I have been keeping up with my exercises. I'm tired because I have been at work since 4 AM. Mardella Layman noticed swelling under my right arm and she was worried about it. I am going on my trip after I get approval from [place], but no date set yet.   PERTINENT HISTORY:  Neoadjuvant chemotherapy with Doxorubicin and Cyclophosphamide given x 4 beginning 01/27/2021 and completing on 03/10/2021 followed by weekly paclitaxel x 12 beginning 05/2021 07/09/2021: Right lumpectomy: Grade 2 invasive lobular cancer, 2.5 cm, LCIS, margins negative, LCIS focally at anterior margin, 1/1 lymph node positive, ER 95%, PR 100%, HER2 negative, Ki-67 40% Left lumpectomy: High-grade DCIS: 0.7 cm,  margins negative, 0/1 lymph node negative ER 95%, PR 100%. Pt now  STAGE IV Breast Cancer with bone mets throughout thoracic and lumbar regions and is pending radiation to her lumbar spine next week.    PAIN:  Are you having pain? Yes NPRS scale: 5/10 today in back, 5 in left hip without meds, but improves as  I get going Pain location: LB Pain orientation: Bilateral  PAIN TYPE: aching, sharp,  Pain description:intermittent Aggravating factors: standing, sleeping, walking, stairs, housework Relieving factors: Pain meds,changing positions,  PRECAUTIONS: BONE METS currently s/p palliative radiation to spine, bilateral breast lymphedema, bilateral UE lymphedema risk  WEIGHT BEARING RESTRICTIONS: No  FALLS:  Has patient fallen in last 6 months? No  LIVING ENVIRONMENT: Lives with: currently living alone Lives ZO:XWRUE presently  Has following equipment : Single point cane, Walker - 4 wheeled, shower chair, and bed side commode  OCCUPATION: mgr at [employer]  LEISURE: shop, dominoes  HAND DOMINANCE: right   PRIOR LEVEL OF FUNCTION: Independent  PATIENT GOALS:   Get back to life, keep breast swelling down, learn exercises to strengthen my back/core,keep shoulder ROM on the right track   OBJECTIVE:  COGNITION: Overall cognitive status: Within functional limits for tasks assessed   PALPATION:   OBSERVATIONS / OTHER ASSESSMENTS: bilateral breasts with enlarged pores but improved since prior treatment session  SENSATION: Light touch: Appears intact   POSTURE: forward head, rounded shoulders  UPPER EXTREMITY AROM/PROM:  A/PROM RIGHT   eval   Shoulder extension 55  Shoulder flexion 157  Shoulder abduction 174  Shoulder internal rotation 65  Shoulder external rotation 98    (Blank rows = not tested)  A/PROM LEFT   eval  Shoulder extension 56  Shoulder flexion 156  Shoulder abduction 172  Shoulder internal rotation 65  Shoulder external rotation 95    (Blank rows =  not tested)  CERVICAL AROM: All within functional limits:    LUMBAR ROM: Lumbar ROM without increasing pain; Bilateral SB decreased 25%, FB decreased 90%, BB decreased 90% all with pain  ANKLE  AROM DF : right -12,  LEFT  -8,   , Jul 22, 2022 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,  09/07/2022  Right  0             Left  2  HS Length: 30 deg Bilaterally, 07/22/2022  Left   35    Degrees     Right 45 Degrees, 09/07/2022   left 50          Right: 60  UPPER EXTREMITY STRENGTH: WFL  LOWER EXTREMITY MMT: WFL     LYMPHEDEMA ASSESSMENTS:   SURGERY TYPE/DATE: 07/09/2021 BILATERAL BREAST LUMPECTOMY WITH RADIOACTIVE SEED LOCALIZATION RADIOACTIVE SEED GUIDED RIGHT AXILLARY TARGETED LYMPH NODE DISSECTION LEFT AXILLARY SENTINEL NODE BIOPSY LEFT AXILLARY LYMPH NODE MAPPING WITH SENTIMAG    NUMBER OF LYMPH NODES REMOVED: Right 1/1, Left 0/1   CHEMOTHERAPY:  neoadjuvant    RADIATION:yes bilateral breast, recently finished palliative radiation for bone METS to spine    HORMONE TREATMENT: YES  INFECTIONS: NO  LYMPHEDEMA ASSESSMENTS:     LANDMARK RIGHT  03/04/2022 Right  RIGHT 12/01/22  10 cm proximal to olecranon process 30.5 28.8 29.4  Olecranon process 27.1 25.4 25.8  10 cm proximal to ulnar styloid process 21.2 20.9 20.7  Just proximal to ulnar styloid process 16.6 16.3 16.4  Across hand at thumb web space 21.4 20.4 20.6  At base of 2nd digit 6.8 6.7 6.9  (Blank rows = not tested)   LANDMARK LEFT  03/04/2022 LEFT LEFT 12/01/22  10 cm proximal to olecranon process 30.7 29.4 29.5  Olecranon process 26.7 25.8 26.0  10 cm proximal to ulnar styloid process 20.5 20.3 20.3  Just proximal to ulnar styloid process 16.3 16.15 16.4  Across hand at thumb web space 21.1 20.5 20.3  At base of  2nd digit 6.7 6.4 6.6  Blank rows = not tested)  12/01/2022 RIGHT:   wrist 15.8, elbow 26.5,      axilla 34    Palm 21.2                Left:        wrist 15.8,   elbow 26,    axilla,  33.8   Palm   20.5    GAIT: Distance walked: 20 feet Assistive device utilized: None Level of assistance: Complete Independence Comments: decreased heel strike, toe off, sideways translation with some LOB  QUICK DASH SURVEY: not completed   TODAY'S TREATMENT:   12/01/22 Checked bilateral axillary regions; Pts body habitus is similar bilaterally. If there is swelling in the area it is minimal and pts Flexi touch will help. Measured bilateral UE's for daily note, and measured bilateral UE's for compression garments Pts Alight form with signature and income info scanned into computer by [employer]. Pt would like garments to be sent to me since she is staying in a hotel. Pt will continue with her Flexi touch and exercises, and will return to see me after her garments arrive.    10/27/2022 Checked swelling in Right axilla; compression garment does not quite cover high enough to cover the area. She will have Second to [place] check and see if there is something that comes a little higher. Educated how she can do MLD to the area to decrease swelling. Reviewed and Practiced golf and power lift x 3 ea DLS on airex Eyes closed  3 x 30 SBA Step ups forward and lateral on ax x 10 ea. Nu step:seat 10, UE 10, Lev 4 x 7 min Discussed compression sleeve. Not in yet. Will request 1 more visit to check for fit Checked goals  09/28/2022 NU step seat 10, UE 10, resistance 4 x 6 min Checked Bilateral UE;cording noted bilateral axillary through antecubital regions more palpable on right but tight bilaterally. MFR bilateral upper arms and antecubital fossa to address cords  Supine wand flexion and scaption x 3 ea Supine bilateral AROM flexion, scaption, horizontal abd x 3 ea LTR x 3 bilaterally Airex beam tandem walking 6 lengths no HH, sideways walking 6 lengths no HH Partial step ups forward on 8" step x 10  no HH, and side step ups on 6 in step x 10on Bilateral gastroc stretch on incline x 4 Pulleys flexion and  abduction x 1 min ea Showed pt NTS to gently stretch bilateral UE cording   09/21/2022 Assessed swelling; Right breast with fibrosis at lateral breast and enlarged pores medially. Swelling noted distal to axillary incision. Discussed importance of wearing compression bra to keep swelling down. Pt to make appt to get another bra or 2 this week Pulleys x 2 min flexion and abduction Nustep  seat 10, UE 10, Lev 4  x6 min. Ppt x 10 Ppt with opp hand to knee x 10 ea SLR with ppt 2x5  Reviewed golfers lift x 5 ea side and power lift x 5 Measured bilateral Ue's; no sign of UE lymphedema  09/07/2022  Nu step seat 10, UE 10, lev 3 x 6 min Incline stretch 3 x 20-30 sec Sitting calf stretch x 3, HS stretch  x 3 ea 30 sec Gait in clinic  80 ft x 2 with pt lengthening stride; improved heel strike/toe off Reviewed hip hinge for brushing teeth. Able to return demonstrated. Educated in golfers lift and power lift to protect back  each x4 using bolster for power lift. Requires VC's for keeping head up with power lift. Practiced half knee to stand x 1. SLS avg Right 5 sec, Left 9 seconds Measured DF and HS ROM, and checked goals with pt. Discussed importance of daily exercises to allow her to feel better without a lot of pain meds 08/31/2022 Nustep seat 10, UE 10, Lev 3 x 6 min Incline stretch 3 x 30 sec Heel raises on ax x 15, marching on ax x 15, Marching with alt. UE raises x 10 Side stepping down and back 3 lengths ea Vector reaches x 7 ea with toe touch, 5 ea no touch. SLS right 4,5, 30 sec, Left 12,12, 22 sec Mini squats x 10 in bars Lunges x 10 ea with intermittent hand hold HS stretch with strap 3 x 30 sec  FABER stretch supine x 3 ea  08/24/2022 Pt tried ppt but had difficulty activating Therapist demonstrated ppt on table and then pt reproduced x 10 Ppt with march 2 x 5 Ppt with alternate arms x 10 Figure 4 stretch x 3 ea 30 sec Ppt with alternate hand to knee 2x 10 Ppt with bolster  squeeze x 10 SLR x 5 B with ppt SKTC stretch x 3 B x 30 Nustep seat 10, UE 10 lev 4 x 7 min, 258 steps Incline gastroc stretch x 3 x 30 min Heel toe walking length of bars x 2 and retro walking x 2 SLS bilaterally x 3 ea; held 15-20 sec without LOB 08/20/2022 Nu step seat 10, UE 10, resistance level 3 x 7;20  Marching on ax  x 10, heel raises x 15 no HH Step up  and hold on ax forward and sideways x 10 bilaterally Incline stretch x 3, 30sec Supine HS stretch 3 x 30 sec B Figure 4 stretch x 3 ea 30 sec  Alternate hand to knee with TrA x 10 SLR x 5 B with ppt SKTC stretch x 3 B x 30   06/25/2022  Evaluation only  PATIENT EDUCATION:  08/06/2022 Side Pull: Double Arm   On back, knees bent, feet flat. Arms perpendicular to body, shoulder level, elbows straight but relaxed. Pull arms out to sides, elbows straight. Resistance band comes across collarbones, hands toward floor. Hold momentarily. Slowly return to starting position. Repeat _5-10__ times. Band color _yellow____   EDUCATION  08/10/2022 Access code;[email or web address] Bridge, ppt, ppt with march, hip adductor squeze, hip abduction  Access Code: [email or web address] URL: [email or web address] Date: 08/06/2022  Education details: discussed POC/activities that will be performedAccess Code: [email or web address] URL: [email or web address] Date: 07/22/2022 Prepared by: Alvira Monday  Exercises - Supine Hamstring Stretch with Strap  - 1 x daily - 7 x weekly - 3 sets - 10 reps, Meeks decompression x 5 Person educated: Patient Education method: Explanation Education comprehension: verbalized understanding  HOME EXERCISE PROGRAM:  Supine Hamstring Stretch with Strap  - 1 x daily - 7 x weekly - 3 sets - 10 reps, Meeks decompression x 5, supine horizontal abd with TrA activation and red band x 10, standing scapular retraction red x 10 ASSESSMENT:  CLINICAL IMPRESSION: Pt came in for a Right UE check since Lillard Anes, NP  was  concerned about swelling. Pts normal body habitus is similar bilaterally, and if swelling it is minimal and her Flexi touch would help.  Circumferential measurements are very good and there is no visible UE swelling or pitting edema. Right UE slighlty larger in forearm as is expected with hand dominance  but not significantly so. Ps alight form and income info scanned in to [employer]  OBJECTIVE IMPAIRMENTS: Abnormal gait, decreased activity tolerance, decreased balance, decreased ROM, decreased strength, increased edema, impaired sensation, improper body mechanics, and postural dysfunction.   ACTIVITY LIMITATIONS: lifting, bending, standing, sleeping, stairs, bed mobility, and locomotion level  PARTICIPATION LIMITATIONS: cleaning, laundry, shopping, and occupation  PERSONAL FACTORS: 3+ comorbidities: bilateral Breast Cancer s/p Radiation with new bone Metastasis and palliative radiation for pain  are also affecting patient's functional outcome.   REHAB POTENTIAL: Good  CLINICAL DECISION MAKING: Evolving/moderate complexity  EVALUATION COMPLEXITY: Low  GOALS: Goals reviewed with patient? Yes  SHORT TERM GOALS: Target date: 07/22/2022  Pt will be independent in a HEP for gentle lumbar/core neutral spine activities and LE flexibility Baseline: Goal status:MET 07/22/2022  2.  Pt will be able to get in and out of bed and brush teeth with good body mechanics Baseline: no knowledge Goal status:MET for in/out of bed, Met for hip hinge 09/07/2022   LONG TERM GOALS: Target date: 2/29/2023  Pt will be approved for Flexi touch Sequential compression pump for management of bilateral breast edema Baseline:  Goal status:MET   2.  Pt will have AROM for bilateral DF improved to atleast 5 degrees for improved gait pattern Baseline:  Goal status:MET 09/07/2022 3.  Pt will be educated in proper body mechanics for home activities to prevent injury Baseline: no knowledge Goal status: MET  4.  Pt  will ambulate with improved heel strike /toe off and decreased sideways translation for improved energy conservation Baseline: MET 09/06/2021 Goal status:In progress 5. Pt will be independent in and tolerant of higher level core strength/stability to decrease risk of injury  Baseline:no Knowledge  MET 10/27/2022 6. Pt will maintain SLS bilaterally x 12-15 seconds to demonstrate functional improvement  Baseline 5 sec Right,   9 seconds Left  Partially achieved met Right 15 sec, not met for Left 7. Pt will receive compression garments for Bilateral UE and will be instructed in proper donning/doffing and use  Baseline;ordered not yet received  NEW to be met by 12/08/22   PLAN  PT FREQUENCY: 1-2 visits in 6 weeks after garments are received to instruct in donning, doffing, care and use of compression  PT DURATION:6 weeks  PLANNED INTERVENTIONS: Therapeutic exercises, Therapeutic activity, Neuromuscular re-education, Balance training, Patient/Family education, Self Care, Orthotic/Fit training, Aquatic Therapy, Manual lymph drainage, Manual therapy, and Re-evaluation  PLAN FOR NEXT SESSION: Request date extension through 12/08/2022 to give garments time to come in. Check sleeves for fit and educated in donning, doffing, care, use. Review exs prn, DC next visit or 2. Waynette Buttery, PT 12/01/2022, 12:59 PM

## 2022-12-01 NOTE — Progress Notes (Signed)
Patient approved for one-time $500 Lymphedema grant by Leotis Shames in social work for lymphedema supplies.

## 2022-12-02 ENCOUNTER — Other Ambulatory Visit (HOSPITAL_COMMUNITY): Payer: Self-pay

## 2022-12-03 ENCOUNTER — Other Ambulatory Visit (HOSPITAL_COMMUNITY): Payer: Self-pay

## 2022-12-04 ENCOUNTER — Other Ambulatory Visit (HOSPITAL_COMMUNITY): Payer: Self-pay

## 2022-12-07 ENCOUNTER — Telehealth: Payer: Self-pay | Admitting: Adult Health

## 2022-12-07 ENCOUNTER — Inpatient Hospital Stay (HOSPITAL_BASED_OUTPATIENT_CLINIC_OR_DEPARTMENT_OTHER): Payer: Medicaid Other | Admitting: Adult Health

## 2022-12-07 ENCOUNTER — Encounter: Payer: Self-pay | Admitting: Adult Health

## 2022-12-07 ENCOUNTER — Other Ambulatory Visit: Payer: Self-pay

## 2022-12-07 ENCOUNTER — Inpatient Hospital Stay: Payer: Medicaid Other

## 2022-12-07 VITALS — BP 140/84 | HR 67 | Temp 97.7°F | Ht 71.65 in | Wt 214.0 lb

## 2022-12-07 DIAGNOSIS — Z170 Estrogen receptor positive status [ER+]: Secondary | ICD-10-CM

## 2022-12-07 DIAGNOSIS — C50412 Malignant neoplasm of upper-outer quadrant of left female breast: Secondary | ICD-10-CM | POA: Diagnosis not present

## 2022-12-07 DIAGNOSIS — G893 Neoplasm related pain (acute) (chronic): Secondary | ICD-10-CM

## 2022-12-07 DIAGNOSIS — C7951 Secondary malignant neoplasm of bone: Secondary | ICD-10-CM

## 2022-12-07 DIAGNOSIS — C50411 Malignant neoplasm of upper-outer quadrant of right female breast: Secondary | ICD-10-CM | POA: Diagnosis not present

## 2022-12-07 LAB — CBC WITH DIFFERENTIAL (CANCER CENTER ONLY)
Abs Immature Granulocytes: 0.02 10*3/uL (ref 0.00–0.07)
Basophils Absolute: 0 10*3/uL (ref 0.0–0.1)
Basophils Relative: 1 %
Eosinophils Absolute: 0.1 10*3/uL (ref 0.0–0.5)
Eosinophils Relative: 1 %
HCT: 30.6 % — ABNORMAL LOW (ref 36.0–46.0)
Hemoglobin: 9.5 g/dL — ABNORMAL LOW (ref 12.0–15.0)
Immature Granulocytes: 1 %
Lymphocytes Relative: 17 %
Lymphs Abs: 0.7 10*3/uL (ref 0.7–4.0)
MCH: 25.5 pg — ABNORMAL LOW (ref 26.0–34.0)
MCHC: 31 g/dL (ref 30.0–36.0)
MCV: 82 fL (ref 80.0–100.0)
Monocytes Absolute: 0.5 10*3/uL (ref 0.1–1.0)
Monocytes Relative: 12 %
Neutro Abs: 3 10*3/uL (ref 1.7–7.7)
Neutrophils Relative %: 68 %
Platelet Count: 219 10*3/uL (ref 150–400)
RBC: 3.73 MIL/uL — ABNORMAL LOW (ref 3.87–5.11)
RDW: 14.7 % (ref 11.5–15.5)
WBC Count: 4.3 10*3/uL (ref 4.0–10.5)
nRBC: 0 % (ref 0.0–0.2)

## 2022-12-07 LAB — CMP (CANCER CENTER ONLY)
ALT: 13 U/L (ref 0–44)
AST: 14 U/L — ABNORMAL LOW (ref 15–41)
Albumin: 3.7 g/dL (ref 3.5–5.0)
Alkaline Phosphatase: 45 U/L (ref 38–126)
Anion gap: 6 (ref 5–15)
BUN: 14 mg/dL (ref 6–20)
CO2: 28 mmol/L (ref 22–32)
Calcium: 8.9 mg/dL (ref 8.9–10.3)
Chloride: 107 mmol/L (ref 98–111)
Creatinine: 0.73 mg/dL (ref 0.44–1.00)
GFR, Estimated: >60 mL/min (ref >60)
Glucose, Bld: 88 mg/dL (ref 70–99)
Potassium: 3.8 mmol/L (ref 3.5–5.1)
Sodium: 141 mmol/L (ref 135–145)
Total Bilirubin: 0.3 mg/dL (ref 0.3–1.2)
Total Protein: 6.3 g/dL — ABNORMAL LOW (ref 6.5–8.1)

## 2022-12-07 NOTE — Assessment & Plan Note (Signed)
Chelsea Hansen is a 52 year old woman with metastatic breast cancer here today for follow-up and evaluation after stopping Verzenio and taking Avelox for her diverticulitis.  Chelsea Hansen's diverticulitis has resolved.  I recommended that she restart Verzenio and she continues on anastrozole.  Her increasing lower back pain should be evaluated.  I placed orders for MRI of the lumbar spine with follow-up after the MRI of the lumbar spine for a phone visit to review those results.  I recommended that she continue to take Percocet and Xtampza.  We will follow-up with her a few days after she undergoes imaging to discuss the results.  Chelsea Hansen will return to clinic on July 16 for labs and follow-up.  She is planning on going out of the country the following day.

## 2022-12-07 NOTE — Progress Notes (Signed)
[facility] up:    Chelsea Sessions, NP [name] [address]   DIAGNOSIS:  Cancer Staging  Malignant neoplasm of upper-outer quadrant of left breast in female, estrogen receptor positive (HCC) Staging form: Breast, AJCC 8th Edition - Clinical stage from 12/17/2020: Stage IA (cT1b, cN0, cM0, G2, ER+, PR+, HER2-) - Signed by Chelsea Socks, NP on 01/14/2022 Stage prefix: Initial diagnosis Histologic grading system: 3 grade system Laterality: Left Staged by: Pathologist and managing physician Stage used in treatment planning: Yes National guidelines used in treatment planning: Yes Type of national guideline used in treatment planning: NCCN - Pathologic stage from 07/09/2021: No Stage Recommended (ypTis (DCIS), pN0, cM0) - Signed by Chelsea Socks, NP on 07/22/2021 Stage prefix: Post-therapy  Malignant neoplasm of upper-outer quadrant of right breast in female, estrogen receptor positive (HCC) Staging form: Breast, AJCC 8th Edition - Clinical stage from 12/17/2020: Stage IIA (cT2, cN1, cM0, G2, ER+, PR+, HER2-) - Signed by Chelsea Socks, NP on 01/14/2022 Stage prefix: Initial diagnosis Histologic grading system: 3 grade system Laterality: Right Staged by: Pathologist and managing physician Stage used in treatment planning: Yes National guidelines used in treatment planning: Yes Type of national guideline used in treatment planning: NCCN - Pathologic stage from 07/09/2021: No Stage Recommended (ypT2, pN1a, cM0, G2) - Signed by Chelsea Socks, NP on 07/22/2021 Stage prefix: Post-therapy Histologic grading system: 3 grade system - Pathologic: Stage IV (cM1) - Signed by Chelsea Socks, NP on 12/07/2022   SUMMARY OF ONCOLOGIC HISTORY: Oncology History  Malignant neoplasm of upper-outer quadrant of right breast in female, estrogen receptor positive (HCC)  12/12/2020 Initial Diagnosis   status post  bilateral breast biopsies 12/03/2020, showing             (1) on the right, a clinical T2 N1, stage IIa invasive lobular carcinoma, grade 2, estrogen and progesterone receptor strongly positive, HER2 not amplified, with an MIB-1 of 40%                         (a) the biopsied right axillary lymph node was positive with extracapsular extension                         (b) a second right breast mass also biopsied was a fibroadenoma, concordant  MAMMAPRINT tested on biopsy returned high risk, luminal type B, indicating significant benefit from chemo                         (c) biopsy of an area of non-mass-like enhancement in the upper right breast pending   12/17/2020 Cancer Staging   Staging form: Breast, AJCC 8th Edition - Clinical stage from 12/17/2020: Stage IIA (cT2, cN1, cM0, G2, ER+, PR+, HER2-) - Signed by Chelsea Socks, NP on 01/14/2022 Stage prefix: Initial diagnosis Histologic grading system: 3 grade system Laterality: Right Staged by: Pathologist and managing physician Stage used in treatment planning: Yes National guidelines used in treatment planning: Yes Type of national guideline used in treatment planning: NCCN   12/24/2020 Genetic Testing   Negative genetic testing:  No pathogenic variants detected on the [facility] BRCAplus panel (report date 12/24/2020) or the CancerNext-Expanded + RNAinsight panel (report date 12/31/2020). 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   The BRCAplus panel offered by [facility] and includes  sequencing and deletion/duplication analysis for the following 8 genes: ATM, BRCA1, BRCA2, CDH1, CHEK2, PALB2, PTEN, and TP53. The CancerNext-Expanded + RNAinsight gene panel offered by [facility] and includes sequencing and rearrangement analysis for the following 77 genes: AIP, ALK, APC, ATM, AXIN2, BAP1, BARD1, BLM, BMPR1A, BRCA1, BRCA2, BRIP1, CDC73, CDH1, CDK4, CDKN1B, CDKN2A, CHEK2, CTNNA1,  DICER1, FANCC, FH, FLCN, GALNT12, KIF1B, LZTR1, MAX, MEN1, MET, MLH1, MSH2, MSH3, MSH6, MUTYH, NBN, NF1, NF2, NTHL1, PALB2, PHOX2B, PMS2, POT1, PRKAR1A, PTCH1, PTEN, RAD51C, RAD51D, RB1, RECQL, RET, SDHA, SDHAF2, SDHB, SDHC, SDHD, SMAD4, SMARCA4, SMARCB1, SMARCE1, STK11, SUFU, TMEM127, TP53, TSC1, TSC2, VHL and XRCC2 (sequencing and deletion/duplication); EGFR, EGLN1, HOXB13, KIT, MITF, PDGFRA, POLD1 and POLE (sequencing only); EPCAM and GREM1 (deletion/duplication only). RNA data is routinely analyzed for use in variant interpretation for all genes.   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 Multifocal sclerotic bony metastatic disease greatest in the thoracic spine with scattered rib lesions and sclerosis as well. 2. No nodal or visceral metastasis about the chest. 3. Collateral pathways about the chest suggest some subclavian venous narrowing on the LEFT. This could also be related to arm position. Correlate clinically. 4. Signs of RIGHT breast  lumpectomy. Potential postoperative changes in the LEFT breast with small focal area of nodularity in the lateral LEFT breast which could also be postoperative, correlate clinically and consider mammographic correlation as warranted. 5. Mild cardiac enlargement.   05/18/2022 Treatment Plan Change   Anastrozole + Verzenio 100mg  PO BID   12/07/2022 Cancer Staging   Staging form: Breast, AJCC 8th Edition - Pathologic: Stage IV (cM1) - Signed by Chelsea Socks, NP on 12/07/2022   Malignant neoplasm of upper-outer quadrant of left breast in female, estrogen receptor positive (HCC)  12/12/2020 Initial Diagnosis   on the left, a clinical T1b N0, stage IA invasive ductal carcinoma, grade 1 or 2, estrogen and progesterone receptor positive, HER2 not amplified, with an MIB 1 of 15%   12/17/2020 Cancer Staging   Staging form: Breast, AJCC 8th Edition - Clinical stage from 12/17/2020: Stage IA (cT1b, cN0, cM0, G2, ER+, PR+, HER2-) - Signed by Chelsea Socks, NP on 01/14/2022 Stage prefix: Initial diagnosis Histologic grading system: 3 grade system Laterality: Left Staged by: Pathologist and managing physician Stage used in treatment planning: Yes National guidelines used in treatment planning: Yes Type of national guideline used in treatment planning: NCCN   12/24/2020 Genetic Testing   Negative genetic testing:  No pathogenic variants detected on the [facility] BRCAplus panel (report date 12/24/2020) or the CancerNext-Expanded + RNAinsight panel (report  date 12/31/2020). 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   The BRCAplus panel offered by [facility] and includes sequencing and deletion/duplication analysis for the following 8 genes: ATM, BRCA1, BRCA2, CDH1, CHEK2, PALB2, PTEN, and TP53. The CancerNext-Expanded + RNAinsight gene panel offered by [facility] and includes sequencing and rearrangement analysis for the following 77  genes: AIP, ALK, APC, ATM, AXIN2, BAP1, BARD1, BLM, BMPR1A, BRCA1, BRCA2, BRIP1, CDC73, CDH1, CDK4, CDKN1B, CDKN2A, CHEK2, CTNNA1, DICER1, FANCC, FH, FLCN, GALNT12, KIF1B, LZTR1, MAX, MEN1, MET, MLH1, MSH2, MSH3, MSH6, MUTYH, NBN, NF1, NF2, NTHL1, PALB2, PHOX2B, PMS2, POT1, PRKAR1A, PTCH1, PTEN, RAD51C, RAD51D, RB1, RECQL, RET, SDHA, SDHAF2, SDHB, SDHC, SDHD, SMAD4, SMARCA4, SMARCB1, SMARCE1, STK11, SUFU, TMEM127, TP53, TSC1, TSC2, VHL and XRCC2 (sequencing and deletion/duplication); EGFR, EGLN1, HOXB13, KIT, MITF, PDGFRA, POLD1 and POLE (sequencing only); EPCAM and GREM1 (deletion/duplication only). RNA data is routinely analyzed for use in variant interpretation for all genes.   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 Multifocal sclerotic bony metastatic disease greatest in the thoracic spine with scattered rib lesions and sclerosis as well. 2. No nodal or visceral metastasis about the chest. 3. Collateral pathways about the chest suggest some subclavian venous narrowing on the LEFT. This  could also be related to arm position. Correlate clinically. 4. Signs of RIGHT breast lumpectomy. Potential postoperative changes in the LEFT breast with small focal area of nodularity in the lateral LEFT breast which could also be postoperative, correlate clinically and consider mammographic correlation as warranted. 5. Mild cardiac enlargement.   05/18/2022 Treatment Plan Change   Anastrozole + Verzenio 100mg  PO BID   06/23/2022 - 07/07/2022 Radiation Therapy   06/23/2022 through 07/07/2022 Site Technique Total Dose (Gy) Dose per Fx (Gy) Completed Fx Beam Energies  Lumbar Spine: Spine_LS_Pelv 3D 30/30 3 10/10 15X      CURRENT THERAPY: Anastrozole + Verzenio  INTERVAL HISTORY: [facility] 52 y.o. female returns for f/u.  At her last visit she was doing well, however recent imaging suggested she had diverticulitis which was consistent with her GI symptoms.  She was recommended to stop the Verzenio temporarily and take a course of antibiotics with Avelox.  Since that time her GI symptoms have resolved and she has been eating better and has gained some weight.  She tells me that she has noticed an increase in her lower back pain.  She has a known L3 compression fracture with metastases in the area.  Today her pain is 8 out of 10 but she says that it has become more persistent lately.  He is taking Percocet and Xtampza and uses a heating pad intermittently to help with the pain.  She denies any bowel or bladder incontinence.  She denies any focal extremity weakness or difficulty with walking.   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   GERD (gastroesophageal reflux disease) 05/27/2021    Hyperlipidemia 05/27/2021   Hypertension 05/27/2021   Pain management contract signed 05/04/2021   Chronic pain syndrome (breast cancer) 05/04/2021   Palpitations 03/25/2021   Sinus bradycardia 03/25/2021   Daytime somnolence 03/25/2021   Snoring 03/25/2021   Obesity (BMI 30-39.9) 03/25/2021   Port-A-Cath in place 01/27/2021   Genetic testing 12/24/2020   Family history of uterine cancer 12/17/2020   Malignant neoplasm of upper-outer quadrant of right breast in female, estrogen receptor positive (HCC) 12/12/2020   Malignant neoplasm of upper-outer quadrant of left breast in female, estrogen receptor positive (HCC) 12/12/2020   Hypokalemia 01/02/2012   Asthma 01/01/2012   Bradycardia 01/01/2012   Syncope 12/31/2011   Mediastinal adenopathy 12/31/2011   Anemia 12/31/2011   Chest pain 12/31/2011    has No Known Allergies.  MEDICAL HISTORY: Past Medical History:  Diagnosis Date   Allergy    seasonal allergies   Anxiety    on meds   Asthma    uses inhaler   Breast cancer (HCC) 2012   RIGHT lumpectomy   Cancer (HCC) 2022   RIGHT breast lump-dx 2022   Depression    on meds   DVT (deep venous thrombosis) (HCC) 2010   after hysterectomy   Family history of uterine cancer    GERD (gastroesophageal reflux disease)    with certain foods/OTC PRN meds   Headache(784.0)    History of radiation therapy    Bilateral breast- 09/03/21-11/02/21- Dr. Antony Blackbird   Hyperlipidemia    on meds   Hypertension    on meds   SVT (supraventricular tachycardia)     SURGICAL HISTORY: Past Surgical History:  Procedure Laterality Date   ABDOMINAL HYSTERECTOMY     AXILLARY SENTINEL NODE BIOPSY Left 07/09/2021   Procedure: LEFT AXILLARY SENTINEL NODE BIOPSY;  Surgeon: Abigail Miyamoto, MD;  Location: [facility];  Service: General;  Laterality: Left;   BIOPSY  06/29/2022   Procedure: BIOPSY;  Surgeon: Sherrilyn Rist, MD;  Location: [facility];  Service: Gastroenterology;;    BREAST EXCISIONAL BIOPSY Right 09/16/2009   BREAST LUMPECTOMY WITH RADIOACTIVE SEED LOCALIZATION Bilateral 07/09/2021   Procedure: BILATERAL BREAST LUMPECTOMY WITH RADIOACTIVE SEED LOCALIZATION;  Surgeon: Abigail Miyamoto, MD;  Location: [facility];  Service: General;  Laterality: Bilateral;   BREAST SURGERY     lumpectomy   ESOPHAGOGASTRODUODENOSCOPY (EGD) WITH PROPOFOL N/A 06/29/2022   Procedure: ESOPHAGOGASTRODUODENOSCOPY (EGD) WITH PROPOFOL;  Surgeon: Sherrilyn Rist, MD;  Location: [facility];  Service: Gastroenterology;  Laterality: N/A;   PORT-A-CATH REMOVAL Left 07/09/2021   Procedure: REMOVAL PORT-A-CATH;  Surgeon: Abigail Miyamoto, MD;  Location: [facility];  Service: General;  Laterality: Left;   PORTACATH PLACEMENT Left 01/26/2021   Procedure: INSERTION PORT-A-CATH;  Surgeon: Abigail Miyamoto, MD;  Location: [facility];  Service: General;  Laterality: Left;   RADIOACTIVE SEED GUIDED AXILLARY SENTINEL LYMPH NODE Right 07/09/2021  Procedure: RADIOACTIVE SEED GUIDED RIGHT AXILLARY SENTINEL LYMPH NODE DISSECTION;  Surgeon: Abigail Miyamoto, MD;  Location: [facility];  Service: General;  Laterality: Right;   TUBAL LIGATION      SOCIAL HISTORY: Social History   Socioeconomic History   Marital status: Single    Spouse name: Not on file   Number of children: Not on file   Years of education: Not on file   Highest education level: Not on file  Occupational History   Not on file  Tobacco Use   Smoking status: Never   Smokeless tobacco: Never  Vaping Use   Vaping Use: Never used  Substance and Sexual Activity   Alcohol use: Not Currently    Alcohol/week: 7.0 standard drinks of alcohol    Types: 7 Standard drinks or equivalent per week    Comment: occassional   Drug use: No   Sexual activity: Not Currently  Other Topics Concern   Not on file  Social History Narrative   Not on file   Social Determinants of Health   Financial  Resource Strain: High Risk (01/14/2022)   Overall Financial Resource Strain (CARDIA)    Difficulty of Paying Living Expenses: Very hard  Food Insecurity: Food Insecurity Present (01/14/2022)   Hunger Vital Sign    Worried About Running Out of Food in the Last Year: Sometimes true    Ran Out of Food in the Last Year: Sometimes true  Transportation Needs: No Transportation Needs (12/02/2020)   PRAPARE - Administrator, Civil Service (Medical): No    Lack of Transportation (Non-Medical): No  Physical Activity: Not on file  Stress: Not on file  Social Connections: Not on file  Intimate Partner Violence: Not on file    FAMILY HISTORY: Family History  Problem Relation Age of Onset   Breast cancer Mother        55s   Hypertension Father    Uterine cancer Maternal Aunt    Ovarian cancer Maternal Aunt    Breast cancer Maternal Aunt        9s   Breast cancer Cousin 23   Uterine cancer Cousin 37   Colon polyps Neg Hx    Colon cancer Neg Hx    Esophageal cancer Neg Hx    Stomach cancer Neg Hx     Review of Systems  Constitutional:  Negative for appetite change, chills, fatigue, fever and unexpected weight change.  HENT:   Negative for hearing loss, lump/mass and trouble swallowing.   Eyes:  Negative for eye problems and icterus.  Respiratory:  Negative for chest tightness, cough and shortness of breath.   Cardiovascular:  Negative for chest pain, leg swelling and palpitations.  Gastrointestinal:  Negative for abdominal distention, abdominal pain, constipation, diarrhea, nausea and vomiting.  Endocrine: Negative for hot flashes.  Genitourinary:  Negative for difficulty urinating.   Musculoskeletal:  Positive for back pain. Negative for arthralgias.  Skin:  Negative for itching and rash.  Neurological:  Negative for dizziness, extremity weakness, headaches and numbness.  Hematological:  Negative for adenopathy. Does not bruise/bleed easily.  Psychiatric/Behavioral:   Negative for depression. The patient is not nervous/anxious.       PHYSICAL EXAMINATION   Onc Performance Status - 12/07/22 1017       ECOG Perf Status   ECOG Perf Status Fully active, able to carry on all pre-disease performance without restriction      KPS SCALE   KPS % SCORE Normal activity with  effort, some s/s of disease             Vitals:   12/07/22 1010  BP: (!) 140/84  Pulse: 67  Temp: 97.7 F (36.5 C)  SpO2: 100%    Physical Exam Constitutional:      General: She is not in acute distress.    Appearance: Normal appearance. She is not toxic-appearing.  HENT:     Head: Normocephalic and atraumatic.     Mouth/Throat:     Mouth: Mucous membranes are moist.     Pharynx: Oropharynx is clear. No oropharyngeal exudate or posterior oropharyngeal erythema.  Eyes:     General: No scleral icterus. Cardiovascular:     Rate and Rhythm: Normal rate and regular rhythm.     Pulses: Normal pulses.     Heart sounds: Normal heart sounds.  Pulmonary:     Effort: Pulmonary effort is normal.     Breath sounds: Normal breath sounds.  Abdominal:     General: Abdomen is flat. Bowel sounds are normal. There is no distension.     Palpations: Abdomen is soft.     Tenderness: There is no abdominal tenderness.  Musculoskeletal:        General: No swelling.     Cervical back: Neck supple.     Comments: Strength 5/5 in bilateral lower ext   Lymphadenopathy:     Cervical: No cervical adenopathy.  Skin:    General: Skin is warm and dry.     Findings: No rash.  Neurological:     General: No focal deficit present.     Mental Status: She is alert.  Psychiatric:        Mood and Affect: Mood normal.        Behavior: Behavior normal.     LABORATORY DATA:  CBC    Component Value Date/Time   WBC 4.3 12/07/2022 1002   WBC 2.1 (L) 06/30/2022 1149   RBC 3.73 (L) 12/07/2022 1002   HGB 9.5 (L) 12/07/2022 1002   HGB 11.5 10/06/2020 1432   HCT 30.6 (L) 12/07/2022 1002   HCT  36.8 10/06/2020 1432   PLT 219 12/07/2022 1002   PLT 323 10/06/2020 1432   MCV 82.0 12/07/2022 1002   MCV 73 (L) 10/06/2020 1432   MCH 25.5 (L) 12/07/2022 1002   MCHC 31.0 12/07/2022 1002   RDW 14.7 12/07/2022 1002   RDW 15.9 (H) 10/06/2020 1432   LYMPHSABS 0.7 12/07/2022 1002   LYMPHSABS 1.9 10/06/2020 1432   MONOABS 0.5 12/07/2022 1002   EOSABS 0.1 12/07/2022 1002   EOSABS 0.1 10/06/2020 1432   BASOSABS 0.0 12/07/2022 1002   BASOSABS 0.0 10/06/2020 1432    CMP     Component Value Date/Time   NA 141 12/07/2022 1002   NA 139 10/06/2020 1432   K 3.8 12/07/2022 1002   CL 107 12/07/2022 1002   CO2 28 12/07/2022 1002   GLUCOSE 88 12/07/2022 1002   BUN 14 12/07/2022 1002   BUN 11 10/06/2020 1432   CREATININE 0.73 12/07/2022 1002   CALCIUM 8.9 12/07/2022 1002   PROT 6.3 (L) 12/07/2022 1002   PROT 6.9 10/06/2020 1432   ALBUMIN 3.7 12/07/2022 1002   ALBUMIN 4.1 10/06/2020 1432   AST 14 (L) 12/07/2022 1002   ALT 13 12/07/2022 1002   ALKPHOS 45 12/07/2022 1002   BILITOT 0.3 12/07/2022 1002   GFRNONAA >60 12/07/2022 1002   GFRAA >60 09/07/2019 1340       ASSESSMENT and THERAPY  PLAN:   Malignant neoplasm of upper-outer quadrant of right breast in female, estrogen receptor positive (HCC) Chelsea [facility] is a 52 year old woman with metastatic breast cancer here today for follow-up and evaluation after stopping Verzenio and taking Avelox for her diverticulitis.  Chelsea [facility] diverticulitis has resolved.  I recommended that she restart Verzenio and she continues on anastrozole.  Her increasing lower back pain should be evaluated.  I placed orders for MRI of the lumbar spine with follow-up after the MRI of the lumbar spine for a phone visit to review those results.  I recommended that she continue to take Percocet and Xtampza.  We will follow-up with her a few days after she undergoes imaging to discuss the results.  Chelsea [facility] will return to clinic on July 16 for labs and follow-up.  She is  planning on going out of the country the following day.    All questions were answered. The patient knows to call the clinic with any problems, questions or concerns. We can certainly see the patient much sooner if necessary.  Total encounter time:30 minutes*in face-to-face visit time, chart review, lab review, care coordination, order entry, and documentation of the encounter time.    Lillard Anes, NP 12/07/22 10:50 AM Medical Oncology and Hematology [name] [address] [place], [place] [address] Tel. [phone]    Fax. [phone]  *Total Encounter Time as defined by the Centers for Medicare and Medicaid Services includes, in addition to the face-to-face time of a patient visit (documented in the note above) non-face-to-face time: obtaining and reviewing outside history, ordering and reviewing medications, tests or procedures, care coordination (communications with other health care professionals or caregivers) and documentation in the medical record.

## 2022-12-08 ENCOUNTER — Ambulatory Visit (HOSPITAL_COMMUNITY)
Admission: RE | Admit: 2022-12-08 | Discharge: 2022-12-08 | Disposition: A | Discharge status: Home / Self Care | Payer: Medicaid Other | Source: Ambulatory Visit | Attending: Adult Health | Admitting: Adult Health

## 2022-12-08 ENCOUNTER — Ambulatory Visit: Payer: Medicaid Other

## 2022-12-08 DIAGNOSIS — G893 Neoplasm related pain (acute) (chronic): Secondary | ICD-10-CM | POA: Diagnosis present

## 2022-12-08 DIAGNOSIS — C50411 Malignant neoplasm of upper-outer quadrant of right female breast: Secondary | ICD-10-CM | POA: Diagnosis not present

## 2022-12-08 DIAGNOSIS — Z170 Estrogen receptor positive status [ER+]: Secondary | ICD-10-CM

## 2022-12-08 DIAGNOSIS — C7951 Secondary malignant neoplasm of bone: Secondary | ICD-10-CM | POA: Insufficient documentation

## 2022-12-08 DIAGNOSIS — C50412 Malignant neoplasm of upper-outer quadrant of left female breast: Secondary | ICD-10-CM

## 2022-12-08 DIAGNOSIS — I890 Lymphedema, not elsewhere classified: Secondary | ICD-10-CM

## 2022-12-08 MED ORDER — GADOBUTROL 1 MMOL/ML IV SOLN
10.0000 mL | Freq: Once | INTRAVENOUS | Status: AC | PRN
  Administered 2022-12-08: 10 mL via INTRAVENOUS

## 2022-12-08 NOTE — Therapy (Signed)
OUTPATIENT PHYSICAL THERAPY ONCOLOGY EVALUATION  Patient Name: Chelsea Hansen MRN: [id number] DOB:26-Aug-1970, 52 y.o., female Today's Date: 12/08/2022  END OF SESSION:  PT End of Session - 12/08/22 0857     Visit Number 18    Number of Visits 25    Date for PT Re-Evaluation 12/08/22    Authorization Type Medicaid    Authorization Time Period 11/01/22-12/12/22    Authorization - Number of Visits --    PT Start Time 0900    PT Stop Time 0951    PT Time Calculation (min) 51 min    Activity Tolerance Patient tolerated treatment well    Behavior During Therapy [facility] for tasks assessed/performed             Past Medical History:  Diagnosis Date   Allergy    seasonal allergies   Anxiety    on meds   Asthma    uses inhaler   Breast cancer (HCC) 2012   RIGHT lumpectomy   Cancer (HCC) 2022   RIGHT breast lump-dx 2022   Depression    on meds   DVT (deep venous thrombosis) (HCC) 2010   after hysterectomy   Family history of uterine cancer    GERD (gastroesophageal reflux disease)    with certain foods/OTC PRN meds   Headache(784.0)    History of radiation therapy    Bilateral breast- 09/03/21-11/02/21- Dr. Antony Blackbird   Hyperlipidemia    on meds   Hypertension    on meds   SVT (supraventricular tachycardia)    Past Surgical History:  Procedure Laterality Date   ABDOMINAL HYSTERECTOMY     AXILLARY SENTINEL NODE BIOPSY Left 07/09/2021   Procedure: LEFT AXILLARY SENTINEL NODE BIOPSY;  Surgeon: Abigail Miyamoto, MD;  Location: [facility];  Service: General;  Laterality: Left;   BIOPSY  06/29/2022   Procedure: BIOPSY;  Surgeon: Sherrilyn Rist, MD;  Location: [facility];  Service: Gastroenterology;;   BREAST EXCISIONAL BIOPSY Right 09/16/2009   BREAST LUMPECTOMY WITH RADIOACTIVE SEED LOCALIZATION Bilateral 07/09/2021   Procedure: BILATERAL BREAST LUMPECTOMY WITH RADIOACTIVE SEED LOCALIZATION;  Surgeon: Abigail Miyamoto, MD;  Location: [facility];  Service: General;  Laterality: Bilateral;   BREAST SURGERY     lumpectomy   ESOPHAGOGASTRODUODENOSCOPY (EGD) WITH PROPOFOL N/A 06/29/2022   Procedure: ESOPHAGOGASTRODUODENOSCOPY (EGD) WITH PROPOFOL;  Surgeon: Sherrilyn Rist, MD;  Location: [facility];  Service: Gastroenterology;  Laterality: N/A;   PORT-A-CATH REMOVAL Left 07/09/2021   Procedure: REMOVAL PORT-A-CATH;  Surgeon: Abigail Miyamoto, MD;  Location: [facility];  Service: General;  Laterality: Left;   PORTACATH PLACEMENT Left 01/26/2021   Procedure: INSERTION PORT-A-CATH;  Surgeon: Abigail Miyamoto, MD;  Location: [facility];  Service: General;  Laterality: Left;   RADIOACTIVE SEED GUIDED AXILLARY SENTINEL LYMPH NODE Right 07/09/2021   Procedure: RADIOACTIVE SEED GUIDED RIGHT AXILLARY SENTINEL LYMPH NODE DISSECTION;  Surgeon: Abigail Miyamoto, MD;  Location: [facility];  Service: General;  Laterality: Right;   TUBAL LIGATION     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   GERD (gastroesophageal reflux disease) 05/27/2021   Hyperlipidemia 05/27/2021   Hypertension 05/27/2021   Pain management contract signed 05/04/2021  Chronic pain syndrome (breast cancer) 05/04/2021   Palpitations 03/25/2021   Sinus bradycardia 03/25/2021   Daytime somnolence 03/25/2021   Snoring 03/25/2021   Obesity (BMI 30-39.9) 03/25/2021   Port-A-Cath in place 01/27/2021   Genetic testing 12/24/2020   Family history of uterine cancer 12/17/2020   Malignant neoplasm of upper-outer quadrant of right breast in female, estrogen receptor positive (HCC) 12/12/2020   Malignant neoplasm of upper-outer quadrant of left breast in  female, estrogen receptor positive (HCC) 12/12/2020   Hypokalemia 01/02/2012   Asthma 01/01/2012   Bradycardia 01/01/2012   Syncope 12/31/2011   Mediastinal adenopathy 12/31/2011   Anemia 12/31/2011   Chest pain 12/31/2011    PCP: Gwinda Passe  REFERRING PROVIDER: Antony Blackbird  REFERRING DIAG: Malignant neoplasm of upper-outer quadrant of right breast in female, estrogen receptor positive (HCC)  Malignant neoplasm of upper-outer quadrant of left breast in female, estrogen receptor positive (HCC)  Lymphedema, not elsewhere classified  Metastasis to bone (HCC)  Neoplasm related pain (acute) (chronic)  THERAPY DIAG:  Malignant neoplasm of upper-outer quadrant of right breast in female, estrogen receptor positive (HCC)  Malignant neoplasm of upper-outer quadrant of left breast in female, estrogen receptor positive (HCC)  Lymphedema, not elsewhere classified  Metastasis to bone (HCC)  Neoplasm related pain (acute) (chronic)  ONSET DATE: 01/2021  Rationale for Evaluation and Treatment: Rehabilitation  SUBJECTIVE:                                                                                                                                                                                           SUBJECTIVE STATEMENT:   I had a back MRI this am at 6:00. They thnk I may have more fxs. Keeping up with Flexi touch and exercises  PERTINENT HISTORY:  Neoadjuvant chemotherapy with Doxorubicin and Cyclophosphamide given x 4 beginning 01/27/2021 and completing on 03/10/2021 followed by weekly paclitaxel x 12 beginning 05/2021 07/09/2021: Right lumpectomy: Grade 2 invasive lobular cancer, 2.5 cm, LCIS, margins negative, LCIS focally at anterior margin, 1/1 lymph node positive, ER 95%, PR 100%, HER2 negative, Ki-67 40% Left lumpectomy: High-grade DCIS: 0.7 cm, margins negative, 0/1 lymph node negative ER 95%, PR 100%. Pt now  STAGE IV Breast Cancer with bone mets throughout thoracic and  lumbar regions and is pending radiation to her lumbar spine next week.    PAIN:  Are you having pain? Yes NPRS scale: 5/10 today in back, 5 in left hip without meds, but improves as I get going Pain location: LB Pain orientation: Bilateral  PAIN TYPE: aching, sharp,  Pain description:intermittent Aggravating factors: standing, sleeping, walking, stairs, housework Relieving factors: Pain meds,changing  positions,  PRECAUTIONS: BONE METS currently s/p palliative radiation to spine, bilateral breast lymphedema, bilateral UE lymphedema risk  WEIGHT BEARING RESTRICTIONS: No  FALLS:  Has patient fallen in last 6 months? No  LIVING ENVIRONMENT: Lives with: currently living alone Lives [id number] presently  Has following equipment : Single point cane, Walker - 4 wheeled, shower chair, and bed side commode  OCCUPATION: mgr at [employer]  LEISURE: shop, dominoes  HAND DOMINANCE: right   PRIOR LEVEL OF FUNCTION: Independent  PATIENT GOALS:   Get back to life, keep breast swelling down, learn exercises to strengthen my back/core,keep shoulder ROM on the right track   OBJECTIVE:  COGNITION: Overall cognitive status: Within functional limits for tasks assessed   PALPATION:   OBSERVATIONS / OTHER ASSESSMENTS: bilateral breasts with enlarged pores but improved since prior treatment session  SENSATION: Light touch: Appears intact   POSTURE: forward head, rounded shoulders  UPPER EXTREMITY AROM/PROM:  A/PROM RIGHT   eval   Shoulder extension 55  Shoulder flexion 157  Shoulder abduction 174  Shoulder internal rotation 65  Shoulder external rotation 98    (Blank rows = not tested)  A/PROM LEFT   eval  Shoulder extension 56  Shoulder flexion 156  Shoulder abduction 172  Shoulder internal rotation 65  Shoulder external rotation 95    (Blank rows = not tested)  CERVICAL AROM: All within functional limits:    LUMBAR ROM: Lumbar ROM without increasing pain; Bilateral SB  decreased 25%, FB decreased 90%, BB decreased 90% all with pain  ANKLE  AROM DF : right -12,  LEFT  -8,   , Jul 22, 2022 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,  09/07/2022  Right  0             Left  2  HS Length: 30 deg Bilaterally, 07/22/2022  Left   35    Degrees     Right 45 Degrees, 09/07/2022   left 50          Right: 60  UPPER EXTREMITY STRENGTH: WFL  LOWER EXTREMITY MMT: WFL     LYMPHEDEMA ASSESSMENTS:   SURGERY TYPE/DATE: 07/09/2021 BILATERAL BREAST LUMPECTOMY WITH RADIOACTIVE SEED LOCALIZATION RADIOACTIVE SEED GUIDED RIGHT AXILLARY TARGETED LYMPH NODE DISSECTION LEFT AXILLARY SENTINEL NODE BIOPSY LEFT AXILLARY LYMPH NODE MAPPING WITH SENTIMAG    NUMBER OF LYMPH NODES REMOVED: Right 1/1, Left 0/1   CHEMOTHERAPY:  neoadjuvant    RADIATION:yes bilateral breast, recently finished palliative radiation for bone METS to spine    HORMONE TREATMENT: YES  INFECTIONS: NO  LYMPHEDEMA ASSESSMENTS:     LANDMARK RIGHT  03/04/2022 Right  RIGHT 12/01/22  10 cm proximal to olecranon process 30.5 28.8 29.4  Olecranon process 27.1 25.4 25.8  10 cm proximal to ulnar styloid process 21.2 20.9 20.7  Just proximal to ulnar styloid process 16.6 16.3 16.4  Across hand at thumb web space 21.4 20.4 20.6  At base of 2nd digit 6.8 6.7 6.9  (Blank rows = not tested)   LANDMARK LEFT  03/04/2022 LEFT LEFT 12/01/22  10 cm proximal to olecranon process 30.7 29.4 29.5  Olecranon process 26.7 25.8 26.0  10 cm proximal to ulnar styloid process 20.5 20.3 20.3  Just proximal to ulnar styloid process 16.3 16.15 16.4  Across hand at thumb web space 21.1 20.5 20.3  At base of 2nd digit 6.7 6.4 6.6  Blank rows = not tested)  12/01/2022 RIGHT:   wrist 15.8, elbow 26.5,      axilla 34  Palm 21.2                Left:        wrist 15.8,   elbow 26,    axilla,  33.8   Palm  20.5    GAIT: Distance walked: 20 feet Assistive device utilized: None Level of assistance: Complete Independence Comments: decreased  heel strike, toe off, sideways translation with some LOB  QUICK DASH SURVEY: not completed   TODAY'S TREATMENT:   619/24 Pt shown how to don New compression garments using rubber gloves and stressed to be careful no to run the material with her nails. She donned both sleeves and gauntlets independently. Gauntlets a tad bigger than I would like, but next size smaller would be too small. A piece of white foam was made for dorsum of hand bilaterally to snug it up a bit. Discussed wearing of garments as an absolute for flying, and discussed use for preventative reasons with repetitive activities at home, work or gym activities. Pt verbalized understanding. Discussed laundering of garments and importance of washing every few days of wearing to snug them up as they will stretch out. Discussed Lymphedema precautions;ie avoiding hot tubs, being mindful of skin care if working in yard, Pensions consultant and gave handouts. Signed her up for ABC class in July    12/01/22 Checked bilateral axillary regions; Pts body habitus is similar bilaterally. If there is swelling in the area it is minimal and pts Flexi touch will help. Measured bilateral UE's for daily note, and measured bilateral UE's for compression garments Pts [employer] form with signature and income info scanned into computer by [employer]. Pt would like garments to be sent to me since she is staying in a hotel. Pt will continue with her Flexi touch and exercises, and will return to see me after her garments arrive.    10/27/2022 Checked swelling in Right axilla; compression garment does not quite cover high enough to cover the area. She will have Second to [place] check and see if there is something that comes a little higher. Educated how she can do MLD to the area to decrease swelling. Reviewed and Practiced golf and power lift x 3 ea DLS on airex Eyes closed  3 x 30 SBA Step ups forward and lateral on ax x 10 ea. Nu step:seat 10, UE 10, Lev 4 x 7  min Discussed compression sleeve. Not in yet. Will request 1 more visit to check for fit Checked goals  09/28/2022 NU step seat 10, UE 10, resistance 4 x 6 min Checked Bilateral UE;cording noted bilateral axillary through antecubital regions more palpable on right but tight bilaterally. MFR bilateral upper arms and antecubital fossa to address cords  Supine wand flexion and scaption x 3 ea Supine bilateral AROM flexion, scaption, horizontal abd x 3 ea LTR x 3 bilaterally Airex beam tandem walking 6 lengths no HH, sideways walking 6 lengths no HH Partial step ups forward on 8" step x 10  no HH, and side step ups on 6 in step x 10on Bilateral gastroc stretch on incline x 4 Pulleys flexion and abduction x 1 min ea Showed pt NTS to gently stretch bilateral UE cording   09/21/2022 Assessed swelling; Right breast with fibrosis at lateral breast and enlarged pores medially. Swelling noted distal to axillary incision. Discussed importance of wearing compression bra to keep swelling down. Pt to make appt to get another bra or 2 this week Pulleys x 2 min flexion and abduction  Nustep  seat 10, UE 10, Lev 4  x6 min. Ppt x 10 Ppt with opp hand to knee x 10 ea SLR with ppt 2x5  Reviewed golfers lift x 5 ea side and power lift x 5 Measured bilateral Ue's; no sign of UE lymphedema  09/07/2022  Nu step seat 10, UE 10, lev 3 x 6 min Incline stretch 3 x 20-30 sec Sitting calf stretch x 3, HS stretch  x 3 ea 30 sec Gait in clinic  80 ft x 2 with pt lengthening stride; improved heel strike/toe off Reviewed hip hinge for brushing teeth. Able to return demonstrated. Educated in golfers lift and power lift to protect back each x4 using bolster for power lift. Requires VC's for keeping head up with power lift. Practiced half knee to stand x 1. SLS avg Right 5 sec, Left 9 seconds Measured DF and HS ROM, and checked goals with pt. Discussed importance of daily exercises to allow her to feel better without a lot  of pain meds 08/31/2022 Nustep seat 10, UE 10, Lev 3 x 6 min Incline stretch 3 x 30 sec Heel raises on ax x 15, marching on ax x 15, Marching with alt. UE raises x 10 Side stepping down and back 3 lengths ea Vector reaches x 7 ea with toe touch, 5 ea no touch. SLS right 4,5, 30 sec, Left 12,12, 22 sec Mini squats x 10 in bars Lunges x 10 ea with intermittent hand hold HS stretch with strap 3 x 30 sec  FABER stretch supine x 3 ea  08/24/2022 Pt tried ppt but had difficulty activating Therapist demonstrated ppt on table and then pt reproduced x 10 Ppt with march 2 x 5 Ppt with alternate arms x 10 Figure 4 stretch x 3 ea 30 sec Ppt with alternate hand to knee 2x 10 Ppt with bolster squeeze x 10 SLR x 5 B with ppt SKTC stretch x 3 B x 30 Nustep seat 10, UE 10 lev 4 x 7 min, 258 steps Incline gastroc stretch x 3 x 30 min Heel toe walking length of bars x 2 and retro walking x 2 SLS bilaterally x 3 ea; held 15-20 sec without LOB 08/20/2022 Nu step seat 10, UE 10, resistance level 3 x 7;20  Marching on ax  x 10, heel raises x 15 no HH Step up  and hold on ax forward and sideways x 10 bilaterally Incline stretch x 3, 30sec Supine HS stretch 3 x 30 sec B Figure 4 stretch x 3 ea 30 sec  Alternate hand to knee with TrA x 10 SLR x 5 B with ppt SKTC stretch x 3 B x 30   06/25/2022  Evaluation only  PATIENT EDUCATION:  08/06/2022 Side Pull: Double Arm   On back, knees bent, feet flat. Arms perpendicular to body, shoulder level, elbows straight but relaxed. Pull arms out to sides, elbows straight. Resistance band comes across collarbones, hands toward floor. Hold momentarily. Slowly return to starting position. Repeat _5-10__ times. Band color _yellow____   EDUCATION  08/10/2022 Access code;[email or web address] Bridge, ppt, ppt with march, hip adductor squeze, hip abduction  Access Code: [email or web address] URL: [email or web address] Date: 08/06/2022  Education details: discussed  POC/activities that will be performedAccess Code: [email or web address] URL: [email or web address] Date: 07/22/2022 Prepared by: Alvira Monday  Exercises - Supine Hamstring Stretch with Strap  - 1 x daily - 7 x weekly - 3 sets - 10 reps, Meeks decompression x 5 Person educated: Patient Education method: Explanation Education  comprehension: verbalized understanding  HOME EXERCISE PROGRAM:  Supine Hamstring Stretch with Strap  - 1 x daily - 7 x weekly - 3 sets - 10 reps, Meeks decompression x 5, supine horizontal abd with TrA activation and red band x 10, standing scapular retraction red x 10 ASSESSMENT:  CLINICAL IMPRESSION:  Pt came in to get her compression sleeves and gauntlets that were ordered for her courtesy of [employer].  OBJECTIVE IMPAIRMENTS: Abnormal gait, decreased activity tolerance, decreased balance, decreased ROM, decreased strength, increased edema, impaired sensation, improper body mechanics, and postural dysfunction.   ACTIVITY LIMITATIONS: lifting, bending, standing, sleeping, stairs, bed mobility, and locomotion level  PARTICIPATION LIMITATIONS: cleaning, laundry, shopping, and occupation  PERSONAL FACTORS: 3+ comorbidities: bilateral Breast Cancer s/p Radiation with new bone Metastasis and palliative radiation for pain  are also affecting patient's functional outcome.   REHAB POTENTIAL: Good  CLINICAL DECISION MAKING: Evolving/moderate complexity  EVALUATION COMPLEXITY: Low  GOALS: Goals reviewed with patient? Yes  SHORT TERM GOALS: Target date: 07/22/2022  Pt will be independent in a HEP for gentle lumbar/core neutral spine activities and LE flexibility Baseline: Goal status:MET 07/22/2022  2.  Pt will be able to get in and out of bed and brush teeth with good body mechanics Baseline: no knowledge Goal status:MET for in/out of bed, Met for hip hinge 09/07/2022   LONG TERM GOALS: Target date: 2/29/2023  Pt will be approved for Flexi touch Sequential  compression pump for management of bilateral breast edema Baseline:  Goal status:MET   2.  Pt will have AROM for bilateral DF improved to atleast 5 degrees for improved gait pattern Baseline:  Goal status:MET 09/07/2022 3.  Pt will be educated in proper body mechanics for home activities to prevent injury Baseline: no knowledge Goal status: MET  4.  Pt will ambulate with improved heel strike /toe off and decreased sideways translation for improved energy conservation Baseline: MET 09/06/2021 Goal status:In progress 5. Pt will be independent in and tolerant of higher level core strength/stability to decrease risk of injury  Baseline:no Knowledge  MET 10/27/2022 6. Pt will maintain SLS bilaterally x 12-15 seconds to demonstrate functional improvement  Baseline 5 sec Right,   9 seconds Left  Partially achieved met Right 15 sec, not met for Left 7. Pt will receive compression garments for Bilateral UE and will be instructed in proper donning/doffing and use  Baseline;ordered not yet received  MET 12/08/22  PLAN  PT FREQUENCY: 1-2 visits in 6 weeks after garments are received to instruct in donning, doffing, care and use of compression  PT DURATION:6 weeks  PLANNED INTERVENTIONS: Therapeutic exercises, Therapeutic activity, Neuromuscular re-education, Balance training, Patient/Family education, Self Care, Orthotic/Fit training, Aquatic Therapy, Manual lymph drainage, Manual therapy, and Re-evaluation  PLAN FOR NEXT SESSION:  Pt is discharged to independent self management PHYSICAL THERAPY DISCHARGE SUMMARY  Visits from Start of Care: 18  Current functional level related to goals / functional outcomes: Pt achieved all goals   Remaining deficits: Continued breast swelling managed with compression and flexi touch   Education / Equipment: HEP, theraband, Compression bra, Prophylactic compression garments for bilateral UE's, Flexi Touch  Patient agrees to discharge. Patient goals were  met. Patient is being discharged due to meeting the stated rehab goals.  Waynette Buttery, PT 12/08/2022, 9:53 AM

## 2022-12-13 ENCOUNTER — Telehealth: Payer: Self-pay | Admitting: Adult Health

## 2022-12-13 NOTE — Telephone Encounter (Signed)
Scheduled appointments per los. Patient is aware. 

## 2022-12-15 ENCOUNTER — Other Ambulatory Visit: Payer: Self-pay

## 2022-12-15 ENCOUNTER — Other Ambulatory Visit (HOSPITAL_COMMUNITY): Payer: Self-pay

## 2022-12-15 DIAGNOSIS — G893 Neoplasm related pain (acute) (chronic): Secondary | ICD-10-CM

## 2022-12-15 DIAGNOSIS — C7951 Secondary malignant neoplasm of bone: Secondary | ICD-10-CM

## 2022-12-15 DIAGNOSIS — Z170 Estrogen receptor positive status [ER+]: Secondary | ICD-10-CM

## 2022-12-15 DIAGNOSIS — Z515 Encounter for palliative care: Secondary | ICD-10-CM

## 2022-12-15 MED ORDER — OXYCODONE-ACETAMINOPHEN 7.5-325 MG PO TABS
1.0000 | ORAL_TABLET | Freq: Four times a day (QID) | ORAL | 0 refills | Status: DC | PRN
Start: 2022-12-15 — End: 2023-01-04
  Filled 2022-12-15: qty 102, 13d supply, fill #0

## 2022-12-15 NOTE — Telephone Encounter (Signed)
Pt called to remind palliative team of upcoming trip and to also refill a medication. See new order

## 2022-12-16 ENCOUNTER — Other Ambulatory Visit (HOSPITAL_COMMUNITY): Payer: Self-pay

## 2022-12-20 ENCOUNTER — Other Ambulatory Visit (HOSPITAL_COMMUNITY): Payer: Self-pay

## 2022-12-24 ENCOUNTER — Encounter: Payer: Self-pay | Admitting: Hematology and Oncology

## 2022-12-24 ENCOUNTER — Other Ambulatory Visit (HOSPITAL_COMMUNITY): Payer: Self-pay

## 2022-12-28 ENCOUNTER — Encounter: Payer: Self-pay | Admitting: Hematology and Oncology

## 2023-01-03 NOTE — Progress Notes (Unsigned)
Palliative Medicine [facility]  [phone] [phone]   Name: Chelsea Hansen Date: 01/03/2023 MRN: [id number]  DOB: Nov 26, 1970  Patient Care Team: Grayce Sessions, NP as PCP - General (Internal Medicine) Thomasene Ripple, DO as PCP - Cardiology (Cardiology) Antony Blackbird, MD as Consulting Physician (Radiation Oncology) Abigail Miyamoto, MD as Consulting Physician (General Surgery) Serena Croissant, MD as Consulting Physician (Hematology and Oncology) [name], Arty Baumgartner, NP as Nurse Practitioner (Nurse Practitioner)   INTERVAL HISTORY: Chelsea Hansen is a 52 y.o. 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  MRI of L-spine (12/23) showed L3 pathologic fracture and significant lesion along S1 with sclerotic changes.  Palliative ask to see for symptom management and goals of care.   SOCIAL HISTORY:     reports that she has never smoked. She has never used smokeless tobacco. She reports that she does not currently use alcohol after a past usage of about 7.0 standard drinks of alcohol per week. She reports that she does not use drugs.  ADVANCE DIRECTIVES:    CODE STATUS:   PAST MEDICAL HISTORY: Past Medical History:  Diagnosis Date   Allergy    seasonal allergies   Anxiety    on meds   Asthma    uses inhaler   Breast cancer (HCC) 2012   RIGHT lumpectomy   Cancer (HCC) 2022   RIGHT breast lump-dx 2022   Depression    on meds   DVT (deep venous thrombosis) (HCC) 2010   after hysterectomy   Family history of uterine cancer    GERD (gastroesophageal reflux disease)    with certain foods/OTC PRN meds   Headache(784.0)     History of radiation therapy    Bilateral breast- 09/03/21-11/02/21- Dr. Antony Blackbird   Hyperlipidemia    on meds   Hypertension    on meds   SVT (supraventricular tachycardia)     ALLERGIES:  has No Known Allergies.  MEDICATIONS:  Current Outpatient Medications  Medication Sig Dispense Refill   abemaciclib (VERZENIO) 50 MG tablet Take 1 tablet (50 mg total) by mouth 2 (two) times daily. (Patient not taking: Reported on 12/07/2022) 56 tablet 3   albuterol (VENTOLIN HFA) 108 (90 Base) MCG/ACT inhaler Inhale 2 puffs by mouth into the lungs every 6 hours as needed for wheezing or shortness of breath. 18 g 2   anastrozole (ARIMIDEX) 1 MG tablet Take 1 tablet (1 mg total) by mouth daily. 90 tablet 3   calcium-vitamin D (OSCAL WITH D) 500-5 MG-MCG tablet Take 1 tablet by mouth daily with supper. 90 tablet 2   escitalopram (LEXAPRO) 20 MG tablet Take 1 tablet (20 mg total) by mouth daily. 90 tablet 1   hydrochlorothiazide (HYDRODIURIL) 25 MG tablet Take 1 tablet (25 mg total) by mouth daily. HOLD until follow up with your primary doctor 30 tablet 0   montelukast (SINGULAIR) 10 MG tablet Take 1 tablet (10 mg total) by mouth at bedtime. 90 tablet 3   ondansetron (ZOFRAN) 4 MG tablet Take 1 tablet (4 mg total) by mouth every 8 (eight) hours. 90 tablet 11   oxyCODONE ER 9 MG C12A Take 9 mg by mouth 2 (two) times daily. 60 capsule 0   oxyCODONE-acetaminophen (PERCOCET) 7.5-325 MG  tablet Take 1 - 2 tablets by mouth every 6 (six) hours as needed for severe pain. 102 tablet 0   pantoprazole (PROTONIX) 40 MG tablet Take 1 tablet (40 mg total) by mouth daily. 30 tablet 3   polyethylene glycol (MIRALAX / GLYCOLAX) 17 g packet Take 17 g by mouth 2 (two) times daily. 60 each 3   prochlorperazine (COMPAZINE) 10 MG tablet Take 1 tablet (10 mg total) by mouth every 6 (six) hours as needed for nausea or vomiting. 30 tablet 0   propranolol (INDERAL) 10 MG tablet Take 1 tablet (10 mg total) by mouth 2 (two) times  daily. 180 tablet 3   senna (SENOKOT) 8.6 MG TABS tablet Take 2 tablets (17.2 mg total) by mouth at bedtime. 60 tablet 3   No current facility-administered medications for this visit.    VITAL SIGNS: There were no vitals taken for this visit. There were no vitals filed for this visit.   Estimated body mass index is 29.3 kg/m as calculated from the following:   Height as of 12/07/22: 5' 11.65" (1.82 m).   Weight as of 12/07/22: 214 lb (97.1 kg).   PERFORMANCE STATUS (ECOG) : 1 - Symptomatic but completely ambulatory  Assessment NAD RRR Normal breathing pattern  AAO x4  IMPRESSION: [name] presents to clinic for symptom management follow-up. No acute distress. Overall is doing well. Her son is present. Patient states she is traveling to [place] in the next week. Occasional nausea. Denies constipation or diarrhea.   Neoplasm related pain Chelsea Hansen reports pain is controlled on current regimen. Some days continue to be better than others.   We discussed at length her current regimen.  She is taking Xtampza 9 mg every 12 hours and Percocet every 6 hours as needed for breakthrough pain.  Tolerating medications without difficulty. Taking as prescribed. Refills have been appropriate timing based on pill count. No adjustments to be made today.  We will continue to closely monitor.   Pain contract on file.  Nausea/Vomiting  Resolved   Constipation Improved with bowel regimen.   PLAN: Continue Xtampza 9 mg every 12 hours.    Continue Percocet every 6 hours as needed for breakthrough pain. Not requiring around the clock.  MiraLAX twice daily for bowel regimen Senna daily Protonix 40 mg daily  Patient reports pain is well-controlled. I will plan to see patient back in 4-6 weeks in collaboration with her other oncology appointments. Patient knows to contact our office sooner if needed.   Patient expressed understanding and was in agreement with this plan. She also understands that She  can call the clinic at any time with any questions, concerns, or complaints.     Any controlled substances utilized were prescribed in the context of palliative care. PDMP has been reviewed.    Visit consisted of counseling and education dealing with the complex and emotionally intense issues of symptom management and palliative care in the setting of serious and potentially life-threatening illness.Greater than 50%  of this time was spent counseling and coordinating care related to the above assessment and plan.  Willette Alma, AGPCNP-BC  Palliative Medicine Team/[facility]  *Please note that this is a verbal dictation therefore any spelling or grammatical errors are due to the "[employer] One" system interpretation.

## 2023-01-04 ENCOUNTER — Inpatient Hospital Stay (HOSPITAL_BASED_OUTPATIENT_CLINIC_OR_DEPARTMENT_OTHER): Payer: MEDICAID | Admitting: Adult Health

## 2023-01-04 ENCOUNTER — Encounter: Payer: Self-pay | Admitting: Adult Health

## 2023-01-04 ENCOUNTER — Inpatient Hospital Stay: Payer: MEDICAID

## 2023-01-04 ENCOUNTER — Encounter: Payer: Self-pay | Admitting: Hematology and Oncology

## 2023-01-04 ENCOUNTER — Other Ambulatory Visit: Payer: Self-pay

## 2023-01-04 ENCOUNTER — Other Ambulatory Visit: Payer: Self-pay | Admitting: *Deleted

## 2023-01-04 ENCOUNTER — Inpatient Hospital Stay: Payer: MEDICAID | Attending: Adult Health | Admitting: Nurse Practitioner

## 2023-01-04 ENCOUNTER — Other Ambulatory Visit (HOSPITAL_COMMUNITY): Payer: Self-pay

## 2023-01-04 ENCOUNTER — Encounter: Payer: Self-pay | Admitting: Nurse Practitioner

## 2023-01-04 VITALS — BP 129/74 | HR 60 | Temp 97.8°F | Resp 18 | Ht 71.65 in | Wt 208.6 lb

## 2023-01-04 VITALS — BP 129/74 | HR 60 | Temp 97.8°F | Resp 18 | Wt 208.4 lb

## 2023-01-04 DIAGNOSIS — C7951 Secondary malignant neoplasm of bone: Secondary | ICD-10-CM

## 2023-01-04 DIAGNOSIS — Z170 Estrogen receptor positive status [ER+]: Secondary | ICD-10-CM

## 2023-01-04 DIAGNOSIS — G893 Neoplasm related pain (acute) (chronic): Secondary | ICD-10-CM

## 2023-01-04 DIAGNOSIS — C50412 Malignant neoplasm of upper-outer quadrant of left female breast: Secondary | ICD-10-CM | POA: Diagnosis not present

## 2023-01-04 DIAGNOSIS — C50411 Malignant neoplasm of upper-outer quadrant of right female breast: Secondary | ICD-10-CM | POA: Insufficient documentation

## 2023-01-04 DIAGNOSIS — Z515 Encounter for palliative care: Secondary | ICD-10-CM

## 2023-01-04 DIAGNOSIS — Z79811 Long term (current) use of aromatase inhibitors: Secondary | ICD-10-CM | POA: Diagnosis not present

## 2023-01-04 LAB — CBC WITH DIFFERENTIAL (CANCER CENTER ONLY)
Abs Immature Granulocytes: 0.03 10*3/uL (ref 0.00–0.07)
Basophils Absolute: 0 10*3/uL (ref 0.0–0.1)
Basophils Relative: 1 %
Eosinophils Absolute: 0.1 10*3/uL (ref 0.0–0.5)
Eosinophils Relative: 1 %
HCT: 34 % — ABNORMAL LOW (ref 36.0–46.0)
Hemoglobin: 11 g/dL — ABNORMAL LOW (ref 12.0–15.0)
Immature Granulocytes: 1 %
Lymphocytes Relative: 21 %
Lymphs Abs: 0.9 10*3/uL (ref 0.7–4.0)
MCH: 25.6 pg — ABNORMAL LOW (ref 26.0–34.0)
MCHC: 32.4 g/dL (ref 30.0–36.0)
MCV: 79.1 fL — ABNORMAL LOW (ref 80.0–100.0)
Monocytes Absolute: 0.4 10*3/uL (ref 0.1–1.0)
Monocytes Relative: 10 %
Neutro Abs: 2.8 10*3/uL (ref 1.7–7.7)
Neutrophils Relative %: 66 %
Platelet Count: 244 10*3/uL (ref 150–400)
RBC: 4.3 MIL/uL (ref 3.87–5.11)
RDW: 14.5 % (ref 11.5–15.5)
WBC Count: 4.3 10*3/uL (ref 4.0–10.5)
nRBC: 0 % (ref 0.0–0.2)

## 2023-01-04 LAB — CMP (CANCER CENTER ONLY)
ALT: 14 U/L (ref 0–44)
AST: 16 U/L (ref 15–41)
Albumin: 4.3 g/dL (ref 3.5–5.0)
Alkaline Phosphatase: 54 U/L (ref 38–126)
Anion gap: 6 (ref 5–15)
BUN: 13 mg/dL (ref 6–20)
CO2: 30 mmol/L (ref 22–32)
Calcium: 9.5 mg/dL (ref 8.9–10.3)
Chloride: 106 mmol/L (ref 98–111)
Creatinine: 0.79 mg/dL (ref 0.44–1.00)
GFR, Estimated: >60 mL/min (ref >60)
Glucose, Bld: 90 mg/dL (ref 70–99)
Potassium: 3.7 mmol/L (ref 3.5–5.1)
Sodium: 142 mmol/L (ref 135–145)
Total Bilirubin: 0.5 mg/dL (ref 0.3–1.2)
Total Protein: 7.5 g/dL (ref 6.5–8.1)

## 2023-01-04 MED ORDER — OXYCODONE-ACETAMINOPHEN 7.5-325 MG PO TABS
1.0000 | ORAL_TABLET | Freq: Four times a day (QID) | ORAL | 0 refills | Status: DC | PRN
Start: 2023-01-04 — End: 2023-01-28
  Filled 2023-01-04: qty 102, 13d supply, fill #0

## 2023-01-04 MED ORDER — OXYCODONE ER 9 MG PO C12A
9.0000 mg | EXTENDED_RELEASE_CAPSULE | Freq: Two times a day (BID) | ORAL | 0 refills | Status: DC
Start: 2023-01-04 — End: 2023-02-14
  Filled 2023-01-04: qty 60, 30d supply, fill #0

## 2023-01-04 NOTE — Progress Notes (Unsigned)
[facility] Cancer Follow up:    Chelsea Sessions, NP [name] [place] [address]   DIAGNOSIS: Cancer Staging  Malignant neoplasm of upper-outer quadrant of left breast in female, estrogen receptor positive (HCC) Staging form: Breast, AJCC 8th Edition - Clinical stage from 12/17/2020: Stage IA (cT1b, cN0, cM0, G2, ER+, PR+, HER2-) - Signed by Chelsea Socks, NP on 01/14/2022 Stage prefix: Initial diagnosis Histologic grading system: 3 grade system Laterality: Left Staged by: Pathologist and managing physician Stage used in treatment planning: Yes National guidelines used in treatment planning: Yes Type of national guideline used in treatment planning: NCCN - Pathologic stage from 07/09/2021: No Stage Recommended (ypTis (DCIS), pN0, cM0) - Signed by Chelsea Socks, NP on 07/22/2021 Stage prefix: Post-therapy  Malignant neoplasm of upper-outer quadrant of right breast in female, estrogen receptor positive (HCC) Staging form: Breast, AJCC 8th Edition - Clinical stage from 12/17/2020: Stage IIA (cT2, cN1, cM0, G2, ER+, PR+, HER2-) - Signed by Chelsea Socks, NP on 01/14/2022 Stage prefix: Initial diagnosis Histologic grading system: 3 grade system Laterality: Right Staged by: Pathologist and managing physician Stage used in treatment planning: Yes National guidelines used in treatment planning: Yes Type of national guideline used in treatment planning: NCCN - Pathologic stage from 07/09/2021: No Stage Recommended (ypT2, pN1a, cM0, G2) - Signed by Chelsea Socks, NP on 07/22/2021 Stage prefix: Post-therapy Histologic grading system: 3 grade system - Pathologic: Stage IV (cM1) - Signed by Chelsea Socks, NP on 12/07/2022   SUMMARY OF ONCOLOGIC HISTORY: Oncology History  Malignant neoplasm of upper-outer quadrant of right breast in female, estrogen receptor positive (HCC)  12/12/2020 Initial Diagnosis   status post  bilateral breast biopsies 12/03/2020, showing             (1) on the right, a clinical T2 N1, stage IIa invasive lobular carcinoma, grade 2, estrogen and progesterone receptor strongly positive, HER2 not amplified, with an MIB-1 of 40%                         (a) the biopsied right axillary lymph node was positive with extracapsular extension                         (b) a second right breast mass also biopsied was a fibroadenoma, concordant  MAMMAPRINT tested on biopsy returned high risk, luminal type B, indicating significant benefit from chemo                         (c) biopsy of an area of non-mass-like enhancement in the upper right breast pending   12/17/2020 Cancer Staging   Staging form: Breast, AJCC 8th Edition - Clinical stage from 12/17/2020: Stage IIA (cT2, cN1, cM0, G2, ER+, PR+, HER2-) - Signed by Chelsea Socks, NP on 01/14/2022 Stage prefix: Initial diagnosis Histologic grading system: 3 grade system Laterality: Right Staged by: Pathologist and managing physician Stage used in treatment planning: Yes National guidelines used in treatment planning: Yes Type of national guideline used in treatment planning: NCCN   12/24/2020 Genetic Testing   Negative genetic testing:  No pathogenic variants detected on the [facility] BRCAplus panel (report date 12/24/2020) or the CancerNext-Expanded + RNAinsight panel (report date 12/31/2020). 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   The BRCAplus panel offered by Chelsea Hansen and includes sequencing  and deletion/duplication analysis for the following 8 genes: ATM, BRCA1, BRCA2, CDH1, CHEK2, PALB2, PTEN, and TP53. The CancerNext-Expanded + RNAinsight gene panel offered by Chelsea Hansen and includes sequencing and rearrangement analysis for the following 77 genes: AIP, ALK, APC, ATM, AXIN2, BAP1, BARD1, BLM, BMPR1A, BRCA1, BRCA2, BRIP1, CDC73, CDH1, CDK4, CDKN1B, CDKN2A, CHEK2, CTNNA1,  DICER1, FANCC, FH, FLCN, GALNT12, KIF1B, LZTR1, MAX, MEN1, MET, MLH1, MSH2, MSH3, MSH6, MUTYH, NBN, NF1, NF2, NTHL1, PALB2, PHOX2B, PMS2, POT1, PRKAR1A, PTCH1, PTEN, RAD51C, RAD51D, RB1, RECQL, RET, SDHA, SDHAF2, SDHB, SDHC, SDHD, SMAD4, SMARCA4, SMARCB1, SMARCE1, STK11, SUFU, TMEM127, TP53, TSC1, TSC2, VHL and XRCC2 (sequencing and deletion/duplication); EGFR, EGLN1, HOXB13, KIT, MITF, PDGFRA, POLD1 and POLE (sequencing only); EPCAM and GREM1 (deletion/duplication only). RNA data is routinely analyzed for use in variant interpretation for all genes.   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 Multifocal sclerotic bony metastatic disease greatest in the thoracic spine with scattered rib lesions and sclerosis as well. 2. No nodal or visceral metastasis about the chest. 3. Collateral pathways about the chest suggest some subclavian venous narrowing on the LEFT. This could also be related to arm position. Correlate clinically. 4. Signs of RIGHT breast  lumpectomy. Potential postoperative changes in the LEFT breast with small focal area of nodularity in the lateral LEFT breast which could also be postoperative, correlate clinically and consider mammographic correlation as warranted. 5. Mild cardiac enlargement.   05/18/2022 Treatment Plan Change   Anastrozole + Verzenio 100mg  PO BID   12/07/2022 Cancer Staging   Staging form: Breast, AJCC 8th Edition - Pathologic: Stage IV (cM1) - Signed by Chelsea Socks, NP on 12/07/2022   Malignant neoplasm of upper-outer quadrant of left breast in female, estrogen receptor positive (HCC)  12/12/2020 Initial Diagnosis   on the left, a clinical T1b N0, stage IA invasive ductal carcinoma, grade 1 or 2, estrogen and progesterone receptor positive, HER2 not amplified, with an MIB 1 of 15%   12/17/2020 Cancer Staging   Staging form: Breast, AJCC 8th Edition - Clinical stage from 12/17/2020: Stage IA (cT1b, cN0, cM0, G2, ER+, PR+, HER2-) - Signed by Chelsea Socks, NP on 01/14/2022 Stage prefix: Initial diagnosis Histologic grading system: 3 grade system Laterality: Left Staged by: Pathologist and managing physician Stage used in treatment planning: Yes National guidelines used in treatment planning: Yes Type of national guideline used in treatment planning: NCCN   12/24/2020 Genetic Testing   Negative genetic testing:  No pathogenic variants detected on the [facility] BRCAplus panel (report date 12/24/2020) or the CancerNext-Expanded + RNAinsight panel (report date  12/31/2020). 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   The BRCAplus panel offered by Chelsea Hansen and includes sequencing and deletion/duplication analysis for the following 8 genes: ATM, BRCA1, BRCA2, CDH1, CHEK2, PALB2, PTEN, and TP53. The CancerNext-Expanded + RNAinsight gene panel offered by Chelsea Hansen and includes sequencing and rearrangement analysis for the following 77  genes: AIP, ALK, APC, ATM, AXIN2, BAP1, BARD1, BLM, BMPR1A, BRCA1, BRCA2, BRIP1, CDC73, CDH1, CDK4, CDKN1B, CDKN2A, CHEK2, CTNNA1, DICER1, FANCC, FH, FLCN, GALNT12, KIF1B, LZTR1, MAX, MEN1, MET, MLH1, MSH2, MSH3, MSH6, MUTYH, NBN, NF1, NF2, NTHL1, PALB2, PHOX2B, PMS2, POT1, PRKAR1A, PTCH1, PTEN, RAD51C, RAD51D, RB1, RECQL, RET, SDHA, SDHAF2, SDHB, SDHC, SDHD, SMAD4, SMARCA4, SMARCB1, SMARCE1, STK11, SUFU, TMEM127, TP53, TSC1, TSC2, VHL and XRCC2 (sequencing and deletion/duplication); EGFR, EGLN1, HOXB13, KIT, MITF, PDGFRA, POLD1 and POLE (sequencing only); EPCAM and GREM1 (deletion/duplication only). RNA data is routinely analyzed for use in variant interpretation for all genes.   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 Multifocal sclerotic bony metastatic disease greatest in the thoracic spine with scattered rib lesions and sclerosis as well. 2. No nodal or visceral metastasis about the chest. 3. Collateral pathways about the chest suggest some subclavian venous narrowing on the LEFT. This  could also be related to arm position. Correlate clinically. 4. Signs of RIGHT breast lumpectomy. Potential postoperative changes in the LEFT breast with small focal area of nodularity in the lateral LEFT breast which could also be postoperative, correlate clinically and consider mammographic correlation as warranted. 5. Mild cardiac enlargement.   05/18/2022 Treatment Plan Change   Anastrozole + Verzenio 100mg  PO BID   06/23/2022 - 07/07/2022 Radiation Therapy   06/23/2022 through 07/07/2022 Site Technique Total Dose (Gy) Dose per Fx (Gy) Completed Fx Beam Energies  Lumbar Spine: Spine_LS_Pelv 3D 30/30 3 10/10 15X       CURRENT THERAPY: Anastrozole/Verzenio  INTERVAL HISTORY: Chelsea Hansen 52 y.o. 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   GERD (gastroesophageal reflux disease) 05/27/2021   Hyperlipidemia 05/27/2021   Hypertension 05/27/2021   Pain management contract signed 05/04/2021   Chronic pain syndrome (breast cancer) 05/04/2021   Palpitations 03/25/2021   Sinus bradycardia  03/25/2021   Daytime somnolence 03/25/2021   Snoring 03/25/2021   Obesity (BMI 30-39.9) 03/25/2021   Port-A-Cath in place 01/27/2021   Genetic testing 12/24/2020   Family history of uterine cancer 12/17/2020   Malignant neoplasm of upper-outer quadrant of right breast in female, estrogen receptor positive (HCC) 12/12/2020   Malignant neoplasm of upper-outer quadrant of left breast in female, estrogen receptor positive (HCC) 12/12/2020   Hypokalemia 01/02/2012   Asthma 01/01/2012   Bradycardia  01/01/2012   Syncope 12/31/2011   Mediastinal adenopathy 12/31/2011   Anemia 12/31/2011   Chest pain 12/31/2011    has No Known Allergies.  MEDICAL HISTORY: Past Medical History:  Diagnosis Date   Allergy    seasonal allergies   Anxiety    on meds   Asthma    uses inhaler   Breast cancer (HCC) 2012   RIGHT lumpectomy   Cancer (HCC) 2022   RIGHT breast lump-dx 2022   Depression    on meds   DVT (deep venous thrombosis) (HCC) 2010   after hysterectomy   Family history of uterine cancer    GERD (gastroesophageal reflux disease)    with certain foods/OTC PRN meds   Headache(784.0)    History of radiation therapy    Bilateral breast- 09/03/21-11/02/21- Dr. Antony Blackbird   Hyperlipidemia    on meds   Hypertension    on meds   SVT (supraventricular tachycardia)     SURGICAL HISTORY: Past Surgical History:  Procedure Laterality Date   ABDOMINAL HYSTERECTOMY     AXILLARY SENTINEL NODE BIOPSY Left 07/09/2021   Procedure: LEFT AXILLARY SENTINEL NODE BIOPSY;  Surgeon: Abigail Miyamoto, MD;  Location: [facility];  Service: General;  Laterality: Left;   BIOPSY  06/29/2022   Procedure: BIOPSY;  Surgeon: Sherrilyn Rist, MD;  Location: [facility];  Service: Gastroenterology;;   BREAST EXCISIONAL BIOPSY Right 09/16/2009   BREAST LUMPECTOMY WITH RADIOACTIVE SEED LOCALIZATION Bilateral 07/09/2021   Procedure: BILATERAL BREAST LUMPECTOMY WITH RADIOACTIVE SEED LOCALIZATION;  Surgeon: Abigail Miyamoto, MD;  Location: [facility];  Service: General;  Laterality: Bilateral;   BREAST SURGERY     lumpectomy   ESOPHAGOGASTRODUODENOSCOPY (EGD) WITH PROPOFOL N/A 06/29/2022   Procedure: ESOPHAGOGASTRODUODENOSCOPY (EGD) WITH PROPOFOL;  Surgeon: Sherrilyn Rist, MD;  Location: [facility];  Service: Gastroenterology;  Laterality: N/A;   PORT-A-CATH REMOVAL Left 07/09/2021   Procedure: REMOVAL PORT-A-CATH;  Surgeon: Abigail Miyamoto, MD;  Location: [facility];  Service: General;  Laterality: Left;   PORTACATH PLACEMENT Left 01/26/2021   Procedure: INSERTION PORT-A-CATH;  Surgeon: Abigail Miyamoto, MD;  Location: [facility];  Service: General;  Laterality: Left;   RADIOACTIVE SEED GUIDED AXILLARY SENTINEL LYMPH NODE Right 07/09/2021   Procedure: RADIOACTIVE SEED GUIDED RIGHT AXILLARY SENTINEL LYMPH NODE DISSECTION;  Surgeon: Abigail Miyamoto, MD;  Location: [facility];  Service: General;  Laterality: Right;   TUBAL LIGATION      SOCIAL HISTORY: Social History   Socioeconomic History   Marital status: Single    Spouse name: Not on file   Number of children: Not on file   Years of education: Not on file   Highest education level: Not on file  Occupational History   Not on file  Tobacco Use   Smoking status: Never   Smokeless tobacco: Never  Vaping Use   Vaping status: Never Used  Substance and Sexual Activity   Alcohol use: Not Currently    Alcohol/week: 7.0 standard drinks  of alcohol    Types: 7 Standard drinks or equivalent per week    Comment: occassional   Drug use: No   Sexual activity: Not Currently  Other Topics Concern   Not on file  Social History Narrative   Not on file   Social Determinants of Health   Financial Resource Strain: High Risk (01/14/2022)   Overall Financial Resource Strain (CARDIA)    Difficulty of Paying Living Expenses: Very hard  Food Insecurity: Food Insecurity Present (01/14/2022)   Hunger Vital Sign    Worried About Running Out of Food in the Last Year: Sometimes true    Ran Out of Food in the Last Year: Sometimes true  Transportation Needs: No Transportation Needs (12/02/2020)   PRAPARE - Administrator, Civil Service (Medical): No    Lack of Transportation (Non-Medical): No  Physical Activity: Not on file  Stress: Not on file  Social Connections: Not on file  Intimate Partner Violence: Not on file    FAMILY HISTORY: Family History  Problem Relation Age  of Onset   Breast cancer Mother        41s   Hypertension Father    Uterine cancer Maternal Aunt    Ovarian cancer Maternal Aunt    Breast cancer Maternal Aunt        75s   Breast cancer Cousin 56   Uterine cancer Cousin 55   Colon polyps Neg Hx    Colon cancer Neg Hx    Esophageal cancer Neg Hx    Stomach cancer Neg Hx     Review of Systems - Oncology    PHYSICAL EXAMINATION    There were no vitals filed for this visit.  Physical Exam  LABORATORY DATA:  CBC    Component Value Date/Time   WBC 4.3 01/04/2023 1307   WBC 2.1 (L) 06/30/2022 1149   RBC 4.30 01/04/2023 1307   HGB 11.0 (L) 01/04/2023 1307   HGB 11.5 10/06/2020 1432   HCT 34.0 (L) 01/04/2023 1307   HCT 36.8 10/06/2020 1432   PLT 244 01/04/2023 1307   PLT 323 10/06/2020 1432   MCV 79.1 (L) 01/04/2023 1307   MCV 73 (L) 10/06/2020 1432   MCH 25.6 (L) 01/04/2023 1307   MCHC 32.4 01/04/2023 1307   RDW 14.5 01/04/2023 1307   RDW 15.9 (H) 10/06/2020 1432   LYMPHSABS 0.9 01/04/2023 1307   LYMPHSABS 1.9 10/06/2020 1432   MONOABS 0.4 01/04/2023 1307   EOSABS 0.1 01/04/2023 1307   EOSABS 0.1 10/06/2020 1432   BASOSABS 0.0 01/04/2023 1307   BASOSABS 0.0 10/06/2020 1432    CMP     Component Value Date/Time   NA 142 01/04/2023 1307   NA 139 10/06/2020 1432   K 3.7 01/04/2023 1307   CL 106 01/04/2023 1307   CO2 30 01/04/2023 1307   GLUCOSE 90 01/04/2023 1307   BUN 13 01/04/2023 1307   BUN 11 10/06/2020 1432   CREATININE 0.79 01/04/2023 1307   CALCIUM 9.5 01/04/2023 1307   PROT 7.5 01/04/2023 1307   PROT 6.9 10/06/2020 1432   ALBUMIN 4.3 01/04/2023 1307   ALBUMIN 4.1 10/06/2020 1432   AST 16 01/04/2023 1307   ALT 14 01/04/2023 1307   ALKPHOS 54 01/04/2023 1307   BILITOT 0.5 01/04/2023 1307   GFRNONAA >60 01/04/2023 1307   GFRAA >60 09/07/2019 1340       PENDING LABS:   RADIOGRAPHIC STUDIES:  No results found.   PATHOLOGY:  ASSESSMENT and THERAPY PLAN:   No problem-specific  Assessment & Plan notes found for this encounter.   No orders of the defined types were placed in this encounter.   All questions were answered. The patient knows to call the clinic with any problems, questions or concerns. We can certainly see the patient much sooner if necessary. This note was electronically signed. Noreene Filbert, NP 01/04/2023

## 2023-01-05 ENCOUNTER — Other Ambulatory Visit (HOSPITAL_COMMUNITY): Payer: Self-pay

## 2023-01-05 ENCOUNTER — Encounter: Payer: Self-pay | Admitting: Hematology and Oncology

## 2023-01-05 ENCOUNTER — Telehealth: Payer: Self-pay

## 2023-01-05 NOTE — Assessment & Plan Note (Signed)
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  Stage IV breast cancer: She will continue on anastrozole, Verzenio, and Xgeva.   She has no clinical or radiographic signs of breast cancer progression and will repeat imaging in 3 to 6 months. Back pain: managed with Xtampza and Percocet by palliative care.  I reviewed her MRI results with her and her L3 compression fracture.  I sent a referral to radiation oncology to discuss kyphoplasty with her.    [name] will RTC in 4 weeks for f/u.  She knows to call for any questions or concerns that may arise between now and her next visit.

## 2023-01-05 NOTE — Telephone Encounter (Signed)
Notified Patient of prior authorization approval for Oxycodone-Acetaminophen 7.5/325 mg tablets and Xtampza 9mg  ER Capsules. Medication is approved through 01/04/2024. Pharmacy notified. No other needs or concerns voiced at this time.

## 2023-01-20 ENCOUNTER — Other Ambulatory Visit: Payer: Self-pay | Admitting: Hematology and Oncology

## 2023-01-20 ENCOUNTER — Other Ambulatory Visit: Payer: Self-pay

## 2023-01-20 ENCOUNTER — Other Ambulatory Visit (HOSPITAL_COMMUNITY): Payer: Self-pay

## 2023-01-20 DIAGNOSIS — C50411 Malignant neoplasm of upper-outer quadrant of right female breast: Secondary | ICD-10-CM

## 2023-01-20 MED ORDER — ABEMACICLIB 50 MG PO TABS
50.0000 mg | ORAL_TABLET | Freq: Two times a day (BID) | ORAL | 3 refills | Status: DC
Start: 2023-01-20 — End: 2023-06-08
  Filled 2023-01-20: qty 56, 28d supply, fill #0
  Filled 2023-02-22: qty 56, 28d supply, fill #1
  Filled 2023-04-01: qty 56, 28d supply, fill #2
  Filled 2023-05-09: qty 56, 28d supply, fill #3

## 2023-01-25 ENCOUNTER — Inpatient Hospital Stay: Admission: RE | Admit: 2023-01-25 | Payer: MEDICAID | Source: Ambulatory Visit

## 2023-01-25 NOTE — Progress Notes (Signed)
Patient Care Team: Grayce Sessions, NP as PCP - General (Internal Medicine) Thomasene Ripple, DO as PCP - Cardiology (Cardiology) Antony Blackbird, MD as Consulting Physician (Radiation Oncology) Abigail Miyamoto, MD as Consulting Physician (General Surgery) Serena Croissant, MD as Consulting Physician (Hematology and Oncology) [name], Arty Baumgartner, NP as Nurse Practitioner (Nurse Practitioner)  DIAGNOSIS:  Encounter Diagnosis  Name Primary?   Malignant neoplasm of upper-outer quadrant of right breast in female, estrogen receptor positive (HCC) Yes    SUMMARY OF ONCOLOGIC HISTORY: Oncology History  Malignant neoplasm of upper-outer quadrant of right breast in female, estrogen receptor positive (HCC)  12/12/2020 Initial Diagnosis   status post bilateral breast biopsies 12/03/2020, showing             (1) on the right, a clinical T2 N1, stage IIa invasive lobular carcinoma, grade 2, estrogen and progesterone receptor strongly positive, HER2 not amplified, with an MIB-1 of 40%                         (a) the biopsied right axillary lymph node was positive with extracapsular extension                         (b) a second right breast mass also biopsied was a fibroadenoma, concordant  MAMMAPRINT tested on biopsy returned high risk, luminal type B, indicating significant benefit from chemo                         (c) biopsy of an area of non-mass-like enhancement in the upper right breast pending   12/17/2020 Cancer Staging   Staging form: Breast, AJCC 8th Edition - Clinical stage from 12/17/2020: Stage IIA (cT2, cN1, cM0, G2, ER+, PR+, HER2-) - Signed by Loa Socks, NP on 01/14/2022 Stage prefix: Initial diagnosis Histologic grading system: 3 grade system Laterality: Right Staged by: Pathologist and managing physician Stage used in treatment planning: Yes National guidelines used in treatment planning: Yes Type of national guideline used in treatment planning: NCCN    12/24/2020 Genetic Testing   Negative genetic testing:  No pathogenic variants detected on the [facility] BRCAplus panel (report date 12/24/2020) or the CancerNext-Expanded + RNAinsight panel (report date 12/31/2020). 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   The BRCAplus panel offered by [employer] and includes sequencing and deletion/duplication analysis for the following 8 genes: ATM, BRCA1, BRCA2, CDH1, CHEK2, PALB2, PTEN, and TP53. The CancerNext-Expanded + RNAinsight gene panel offered by [employer] and includes sequencing and rearrangement analysis for the following 77 genes: AIP, ALK, APC, ATM, AXIN2, BAP1, BARD1, BLM, BMPR1A, BRCA1, BRCA2, BRIP1, CDC73, CDH1, CDK4, CDKN1B, CDKN2A, CHEK2, CTNNA1, DICER1, FANCC, FH, FLCN, GALNT12, KIF1B, LZTR1, MAX, MEN1, MET, MLH1, MSH2, MSH3, MSH6, MUTYH, NBN, NF1, NF2, NTHL1, PALB2, PHOX2B, PMS2, POT1, PRKAR1A, PTCH1, PTEN, RAD51C, RAD51D, RB1, RECQL, RET, SDHA, SDHAF2, SDHB, SDHC, SDHD, SMAD4, SMARCA4, SMARCB1, SMARCE1, STK11, SUFU, TMEM127, TP53, TSC1, TSC2, VHL and XRCC2 (sequencing and deletion/duplication); EGFR, EGLN1, HOXB13, KIT, MITF, PDGFRA, POLD1 and POLE (sequencing only); EPCAM and GREM1 (deletion/duplication only). RNA data is routinely analyzed for use in variant interpretation for all genes.   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 Multifocal sclerotic bony metastatic disease greatest in the thoracic spine with scattered rib lesions and sclerosis as well. 2. No nodal or visceral metastasis about the chest. 3. Collateral pathways about the chest suggest some subclavian venous narrowing on the LEFT. This could also be related to arm position. Correlate clinically. 4. Signs of RIGHT breast lumpectomy. Potential postoperative changes in the LEFT breast with small focal area of nodularity in the lateral LEFT breast which could also be postoperative, correlate clinically and consider mammographic correlation as warranted. 5. Mild cardiac enlargement.   05/18/2022 Treatment Plan Change   Anastrozole + Verzenio 100mg  PO BID   12/07/2022 Cancer Staging   Staging form: Breast, AJCC 8th Edition - Pathologic: Stage IV (cM1) - Signed by Loa Socks, NP on 12/07/2022   Malignant neoplasm of upper-outer quadrant of left breast in female, estrogen receptor positive (HCC)  12/12/2020 Initial Diagnosis   on the left, a clinical T1b N0, stage IA invasive ductal carcinoma, grade 1 or 2, estrogen and progesterone receptor positive, HER2 not amplified, with an MIB 1 of 15%   12/17/2020 Cancer Staging   Staging form: Breast, AJCC 8th Edition - Clinical stage from 12/17/2020: Stage IA (cT1b, cN0, cM0, G2, ER+, PR+, HER2-) - Signed by Loa Socks, NP on 01/14/2022 Stage prefix: Initial diagnosis Histologic grading system: 3 grade system Laterality: Left Staged by: Pathologist and managing physician Stage used in treatment  planning: Yes National guidelines used in treatment planning: Yes Type of national guideline used in treatment planning: NCCN   12/24/2020 Genetic Testing   Negative genetic testing:  No pathogenic variants detected on the [facility] BRCAplus panel (report date 12/24/2020) or the CancerNext-Expanded + RNAinsight panel (report date 12/31/2020). 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   The BRCAplus panel offered by [employer] and includes sequencing and deletion/duplication analysis for the following 8 genes: ATM, BRCA1, BRCA2, CDH1, CHEK2, PALB2, PTEN, and TP53. The CancerNext-Expanded + RNAinsight gene panel offered by [employer] and includes sequencing and rearrangement analysis for the following 77 genes: AIP, ALK, APC, ATM, AXIN2, BAP1, BARD1, BLM, BMPR1A, BRCA1, BRCA2, BRIP1, CDC73, CDH1, CDK4, CDKN1B, CDKN2A, CHEK2, CTNNA1, DICER1, FANCC, FH, FLCN, GALNT12, KIF1B, LZTR1, MAX, MEN1, MET, MLH1, MSH2, MSH3, MSH6, MUTYH, NBN, NF1, NF2, NTHL1, PALB2, PHOX2B, PMS2, POT1, PRKAR1A, PTCH1, PTEN, RAD51C, RAD51D, RB1, RECQL, RET, SDHA, SDHAF2, SDHB, SDHC, SDHD, SMAD4, SMARCA4, SMARCB1, SMARCE1, STK11, SUFU, TMEM127, TP53, TSC1, TSC2, VHL and XRCC2 (sequencing and deletion/duplication); EGFR, EGLN1, HOXB13, KIT, MITF, PDGFRA, POLD1 and POLE (sequencing only); EPCAM and GREM1 (deletion/duplication only). RNA data is routinely analyzed for use in variant interpretation for all genes.   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 Multifocal sclerotic bony metastatic disease greatest in the thoracic spine with scattered rib lesions and sclerosis as well. 2. No nodal or visceral metastasis about the chest. 3. Collateral pathways about the chest suggest some subclavian venous narrowing on the LEFT. This could also be related to arm position. Correlate clinically. 4. Signs of RIGHT breast lumpectomy. Potential postoperative changes in the LEFT breast with small focal area of nodularity in the lateral LEFT breast which could also be postoperative, correlate clinically and consider mammographic correlation as warranted. 5. Mild cardiac enlargement.   05/18/2022 Treatment Plan Change   Anastrozole + Verzenio 100mg  PO BID   06/23/2022 - 07/07/2022 Radiation Therapy   06/23/2022 through 07/07/2022 Site Technique Total Dose (Gy) Dose per Fx (Gy) Completed Fx Beam Energies  Lumbar Spine: Spine_LS_Pelv 3D 30/30 3 10/10 15X       CHIEF COMPLIANT: Left breast cancer/ anastrozole  INTERVAL HISTORY: Chelsea Hansen is a 52 y.o. with a history of right breast cancer. Currently on anastrozole therapy. She presents to the clinic for a follow-up. Patient reports that she has lost weight in the past week.  This is due to creased appetite.  She has chronic back pain issues.  Previously kyphoplasty was recommended but she did not make the appointment because they are asking $400 for an appointment.   ALLERGIES:  has No Known Allergies.  MEDICATIONS:  Current  Outpatient Medications  Medication Sig Dispense Refill   abemaciclib (VERZENIO) 50 MG tablet Take 1 tablet (50 mg total) by mouth 2 (two) times daily. 56 tablet 3   albuterol (VENTOLIN HFA) 108 (90 Base) MCG/ACT inhaler Inhale 2 puffs by mouth into the lungs every 6 hours as needed for wheezing or shortness of breath. 18 g 2   anastrozole (ARIMIDEX) 1 MG tablet Take 1 tablet (1 mg total) by mouth daily. 90 tablet 3   calcium-vitamin D (OSCAL WITH D) 500-5 MG-MCG tablet Take 1 tablet by mouth daily with supper. 90 tablet 2   escitalopram (LEXAPRO) 20 MG tablet Take 1 tablet (20 mg total) by mouth daily. 90 tablet 1   hydrochlorothiazide (HYDRODIURIL) 25 MG tablet Take 1 tablet (25 mg total) by mouth daily. HOLD until follow up with your primary doctor 30 tablet 0   montelukast (SINGULAIR) 10 MG tablet Take 1 tablet (10 mg total) by mouth at bedtime. 90 tablet 3   ondansetron (ZOFRAN) 4 MG tablet Take 1 tablet (4 mg total) by mouth every 8 (eight) hours. 90 tablet 11   oxyCODONE ER 9 MG C12A Take 9 mg by mouth 2 (two) times daily. 60 capsule 0   oxyCODONE-acetaminophen (  PERCOCET) 7.5-325 MG tablet Take 1 - 2 tablets by mouth every 6 (six) hours as needed for severe pain. 102 tablet 0   pantoprazole (PROTONIX) 40 MG tablet Take 1 tablet (40 mg total) by mouth daily. 30 tablet 3   polyethylene glycol (MIRALAX / GLYCOLAX) 17 g packet Take 17 g by mouth 2 (two) times daily. 60 each 3   prochlorperazine (COMPAZINE) 10 MG tablet Take 1 tablet (10 mg total) by mouth every 6 (six) hours as needed for nausea or vomiting. 30 tablet 0   propranolol (INDERAL) 10 MG tablet Take 1 tablet (10 mg total) by mouth 2 (two) times daily. 180 tablet 3   senna (SENOKOT) 8.6 MG TABS tablet Take 2 tablets (17.2 mg total) by mouth at bedtime. 60 tablet 3   No current facility-administered medications for this visit.    PHYSICAL EXAMINATION: ECOG PERFORMANCE STATUS: 1 - Symptomatic but completely ambulatory  Vitals:    01/31/23 1440  BP: 129/74  Pulse: 82  Resp: 17  Temp: 98.1 F (36.7 C)  SpO2: 100%   Filed Weights   01/31/23 1440  Weight: 206 lb 4.8 oz (93.6 kg)      LABORATORY DATA:  I have reviewed the data as listed    Latest Ref Rng & Units 01/04/2023    1:07 PM 12/07/2022   10:02 AM 11/23/2022    9:04 AM  CMP  Glucose 70 - 99 mg/dL 90  88  098   BUN 6 - 20 mg/dL 13  14  15    Creatinine 0.44 - 1.00 mg/dL 1.19  1.47  8.29   Sodium 135 - 145 mmol/L 142  141  141   Potassium 3.5 - 5.1 mmol/L 3.7  3.8  3.6   Chloride 98 - 111 mmol/L 106  107  106   CO2 22 - 32 mmol/L 30  28  28    Calcium 8.9 - 10.3 mg/dL 9.5  8.9  9.1   Total Protein 6.5 - 8.1 g/dL 7.5  6.3  7.3   Total Bilirubin 0.3 - 1.2 mg/dL 0.5  0.3  0.4   Alkaline Phos 38 - 126 U/L 54  45  44   AST 15 - 41 U/L 16  14  15    ALT 0 - 44 U/L 14  13  12      Lab Results  Component Value Date   WBC 4.1 01/31/2023   HGB 10.7 (L) 01/31/2023   HCT 32.8 (L) 01/31/2023   MCV 78.1 (L) 01/31/2023   PLT 237 01/31/2023   NEUTROABS 3.0 01/31/2023    ASSESSMENT & PLAN:  Malignant neoplasm of upper-outer quadrant of right breast in female, estrogen receptor positive (HCC) 12/03/2020: Bilateral breast biopsies: Right breast: T2N1 stage IIa grade 2 ILC, ER/PR positive, HER2 negative, Ki-67 40%, right axillary lymph node positive with extracapsular extension MammaPrint: High risk   01/27/2021 -06/08/2021 neoadjuvant chemotherapy with dose dense Adriamycin and Cytoxan x4 followed by Taxol x11   07/09/2021:Right lumpectomy: Grade 2 invasive lobular cancer, 2.5 cm, LCIS, margins negative, LCIS focally at anterior margin, 1/1 lymph node positive, ER 95%, PR 100%, HER2 negative, Ki-67 40% 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   ----------------------------------------------------------------------------------------------------------------------------- Current treatment: Verzinio (started 05/21/2022) with anastrozole Verzinio dose: 50 p.o. twice daily Toxicities: Nausea: Taking premedications  06/02/2022: PET CT scan: No FDG activity in the multifocal sclerotic and mixed lytic and sclerotic lesions involving the spine, ribs, sacrum, pelvic bones and bilateral proximal femurs. 11/21/2022: CT CAP: Bone mets unchanged, 5 mm right lobe of the liver: Stable sigmoid colon: Mild diverticulitis 11/21/2022: Bone scan: Mild activity at L3 vertebral body compression fracture, stable 12/15/2022: MRI lumbar spine: Stable pathologic fracture L3 stable other bone mets   Continue with the current treatment Rivka Barbara will be given next month.   Low back pain: I discussed with the patient that I will contact Dr. Archer Asa with interventional radiology to see if she is eligible for kyphoplasty/osteo cool technique.  Plans to obtain CT scans every 6 months and follow-up after that.    No orders of the defined types were placed in this encounter.  The patient has a good understanding of the overall plan. she agrees with it. she will call with any problems that may develop before the next visit here. Total time spent: 30 mins including face to face time and time spent for planning, charting and co-ordination of care   Tamsen Meek, MD 01/31/23    I Janan Ridge am acting as a Neurosurgeon for [employer]  I have reviewed the above documentation for accuracy and completeness, and I agree with the above.

## 2023-01-28 ENCOUNTER — Other Ambulatory Visit: Payer: Self-pay

## 2023-01-28 ENCOUNTER — Other Ambulatory Visit (HOSPITAL_COMMUNITY): Payer: Self-pay

## 2023-01-28 DIAGNOSIS — C7951 Secondary malignant neoplasm of bone: Secondary | ICD-10-CM

## 2023-01-28 DIAGNOSIS — G893 Neoplasm related pain (acute) (chronic): Secondary | ICD-10-CM

## 2023-01-28 DIAGNOSIS — C50411 Malignant neoplasm of upper-outer quadrant of right female breast: Secondary | ICD-10-CM

## 2023-01-28 DIAGNOSIS — Z515 Encounter for palliative care: Secondary | ICD-10-CM

## 2023-01-28 MED ORDER — OXYCODONE-ACETAMINOPHEN 7.5-325 MG PO TABS
1.0000 | ORAL_TABLET | Freq: Four times a day (QID) | ORAL | 0 refills | Status: DC | PRN
Start: 2023-01-28 — End: 2023-02-14
  Filled 2023-01-28: qty 102, 13d supply, fill #0

## 2023-01-28 NOTE — Telephone Encounter (Signed)
Patient called for refill of medication, see orders.

## 2023-01-31 ENCOUNTER — Inpatient Hospital Stay: Payer: MEDICAID | Admitting: Hematology and Oncology

## 2023-01-31 ENCOUNTER — Inpatient Hospital Stay: Payer: MEDICAID | Attending: Adult Health

## 2023-01-31 ENCOUNTER — Inpatient Hospital Stay (HOSPITAL_BASED_OUTPATIENT_CLINIC_OR_DEPARTMENT_OTHER): Payer: MEDICAID | Admitting: Hematology and Oncology

## 2023-01-31 ENCOUNTER — Inpatient Hospital Stay: Payer: MEDICAID

## 2023-01-31 ENCOUNTER — Other Ambulatory Visit: Payer: Self-pay

## 2023-01-31 VITALS — BP 129/74 | HR 82 | Temp 98.1°F | Resp 17 | Wt 206.3 lb

## 2023-01-31 DIAGNOSIS — Z923 Personal history of irradiation: Secondary | ICD-10-CM | POA: Insufficient documentation

## 2023-01-31 DIAGNOSIS — C7951 Secondary malignant neoplasm of bone: Secondary | ICD-10-CM | POA: Insufficient documentation

## 2023-01-31 DIAGNOSIS — Z79811 Long term (current) use of aromatase inhibitors: Secondary | ICD-10-CM | POA: Insufficient documentation

## 2023-01-31 DIAGNOSIS — C50411 Malignant neoplasm of upper-outer quadrant of right female breast: Secondary | ICD-10-CM

## 2023-01-31 DIAGNOSIS — Z170 Estrogen receptor positive status [ER+]: Secondary | ICD-10-CM | POA: Insufficient documentation

## 2023-01-31 DIAGNOSIS — Z9221 Personal history of antineoplastic chemotherapy: Secondary | ICD-10-CM | POA: Diagnosis not present

## 2023-01-31 DIAGNOSIS — C50412 Malignant neoplasm of upper-outer quadrant of left female breast: Secondary | ICD-10-CM | POA: Insufficient documentation

## 2023-01-31 LAB — CBC WITH DIFFERENTIAL (CANCER CENTER ONLY)
Abs Immature Granulocytes: 0.03 10*3/uL (ref 0.00–0.07)
Basophils Absolute: 0 10*3/uL (ref 0.0–0.1)
Basophils Relative: 1 %
Eosinophils Absolute: 0.1 10*3/uL (ref 0.0–0.5)
Eosinophils Relative: 2 %
HCT: 32.8 % — ABNORMAL LOW (ref 36.0–46.0)
Hemoglobin: 10.7 g/dL — ABNORMAL LOW (ref 12.0–15.0)
Immature Granulocytes: 1 %
Lymphocytes Relative: 16 %
Lymphs Abs: 0.7 10*3/uL (ref 0.7–4.0)
MCH: 25.5 pg — ABNORMAL LOW (ref 26.0–34.0)
MCHC: 32.6 g/dL (ref 30.0–36.0)
MCV: 78.1 fL — ABNORMAL LOW (ref 80.0–100.0)
Monocytes Absolute: 0.3 10*3/uL (ref 0.1–1.0)
Monocytes Relative: 7 %
Neutro Abs: 3 10*3/uL (ref 1.7–7.7)
Neutrophils Relative %: 73 %
Platelet Count: 237 10*3/uL (ref 150–400)
RBC: 4.2 MIL/uL (ref 3.87–5.11)
RDW: 14.9 % (ref 11.5–15.5)
WBC Count: 4.1 10*3/uL (ref 4.0–10.5)
nRBC: 0 % (ref 0.0–0.2)

## 2023-01-31 LAB — CMP (CANCER CENTER ONLY)
ALT: 21 U/L (ref 0–44)
AST: 19 U/L (ref 15–41)
Albumin: 4.1 g/dL (ref 3.5–5.0)
Alkaline Phosphatase: 53 U/L (ref 38–126)
Anion gap: 7 (ref 5–15)
BUN: 15 mg/dL (ref 6–20)
CO2: 28 mmol/L (ref 22–32)
Calcium: 9 mg/dL (ref 8.9–10.3)
Chloride: 107 mmol/L (ref 98–111)
Creatinine: 1.15 mg/dL — ABNORMAL HIGH (ref 0.44–1.00)
GFR, Estimated: 58 mL/min — ABNORMAL LOW (ref >60)
Glucose, Bld: 109 mg/dL — ABNORMAL HIGH (ref 70–99)
Potassium: 3.7 mmol/L (ref 3.5–5.1)
Sodium: 142 mmol/L (ref 135–145)
Total Bilirubin: 0.4 mg/dL (ref 0.3–1.2)
Total Protein: 7.2 g/dL (ref 6.5–8.1)

## 2023-01-31 NOTE — Assessment & Plan Note (Signed)
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  ----------------------------------------------------------------------------------------------------------------------------- Current treatment: Verzinio (started 05/21/2022) with anastrozole Verzinio dose: 50 p.o. twice daily Toxicities: Nausea: Taking premedications   06/02/2022: PET CT scan: No FDG activity in the multifocal sclerotic and mixed lytic and sclerotic lesions involving the spine, ribs, sacrum, pelvic bones and bilateral proximal femurs. 11/21/2022: CT CAP: Bone mets unchanged, 5 mm right lobe of the liver: Stable sigmoid colon: Mild diverticulitis 11/21/2022: Bone scan: Mild activity at L3 vertebral body compression fracture, stable 12/15/2022: MRI lumbar spine: Stable pathologic fracture L3 stable other bone mets   Continue with the current treatment Recommend patient receive Xgeva every 3 months to prevent fractures from bone lesions.   Plans to obtain CT scans every 6 months

## 2023-02-03 ENCOUNTER — Ambulatory Visit: Payer: MEDICAID | Attending: Radiation Oncology

## 2023-02-03 DIAGNOSIS — C50411 Malignant neoplasm of upper-outer quadrant of right female breast: Secondary | ICD-10-CM | POA: Insufficient documentation

## 2023-02-03 DIAGNOSIS — Z170 Estrogen receptor positive status [ER+]: Secondary | ICD-10-CM | POA: Insufficient documentation

## 2023-02-03 DIAGNOSIS — C50412 Malignant neoplasm of upper-outer quadrant of left female breast: Secondary | ICD-10-CM | POA: Insufficient documentation

## 2023-02-03 NOTE — Therapy (Signed)
Pt came into clinic to pick up and try on Custom flat knit sleeves(MEDI 551) and gauntlets. Pt donned with assist of PT using rubber gloves. Good fit and comfortable for pt. Sleeves were unexpected as Medicaid had denied the order previously and [employer] picked up Ready made cost so she would have for travel. These arrived 2 days ago. Good fit. Both were marked right UE, but fit well bilaterally.

## 2023-02-09 ENCOUNTER — Telehealth: Payer: Self-pay | Admitting: Nutrition

## 2023-02-09 ENCOUNTER — Other Ambulatory Visit (HOSPITAL_COMMUNITY): Payer: Self-pay

## 2023-02-09 NOTE — Telephone Encounter (Signed)
Left patient a message in regards to appointment times/dates with the nutrionist

## 2023-02-14 ENCOUNTER — Other Ambulatory Visit: Payer: Self-pay

## 2023-02-14 ENCOUNTER — Other Ambulatory Visit (HOSPITAL_COMMUNITY): Payer: Self-pay

## 2023-02-14 ENCOUNTER — Telehealth: Payer: Self-pay | Admitting: Nurse Practitioner

## 2023-02-14 ENCOUNTER — Encounter: Payer: Self-pay | Admitting: Hematology and Oncology

## 2023-02-14 DIAGNOSIS — C50411 Malignant neoplasm of upper-outer quadrant of right female breast: Secondary | ICD-10-CM

## 2023-02-14 DIAGNOSIS — G893 Neoplasm related pain (acute) (chronic): Secondary | ICD-10-CM

## 2023-02-14 DIAGNOSIS — C7951 Secondary malignant neoplasm of bone: Secondary | ICD-10-CM

## 2023-02-14 DIAGNOSIS — Z515 Encounter for palliative care: Secondary | ICD-10-CM

## 2023-02-14 MED ORDER — OXYCODONE ER 9 MG PO C12A
9.0000 mg | EXTENDED_RELEASE_CAPSULE | Freq: Two times a day (BID) | ORAL | 0 refills | Status: DC
  Filled 2023-02-14 – 2023-03-28 (×2): qty 60, 30d supply, fill #0

## 2023-02-14 MED ORDER — OXYCODONE-ACETAMINOPHEN 7.5-325 MG PO TABS
1.0000 | ORAL_TABLET | Freq: Four times a day (QID) | ORAL | 0 refills | Status: DC | PRN
Start: 2023-02-14 — End: 2023-03-11
  Filled 2023-02-14: qty 102, 13d supply, fill #0

## 2023-02-14 NOTE — Telephone Encounter (Signed)
erroneous

## 2023-02-14 NOTE — Telephone Encounter (Signed)
Patient called for a refill, see associated orders

## 2023-02-15 ENCOUNTER — Inpatient Hospital Stay: Payer: MEDICAID | Admitting: Nurse Practitioner

## 2023-02-15 ENCOUNTER — Other Ambulatory Visit (HOSPITAL_COMMUNITY): Payer: Self-pay

## 2023-02-15 NOTE — Progress Notes (Unsigned)
Palliative Medicine [facility]  [phone] [phone]   Name: Chelsea Hansen Date: 02/15/2023 MRN: [id number]  DOB: 1971-06-16  Patient Care Team: Grayce Sessions, NP as PCP - General (Internal Medicine) Thomasene Ripple, DO as PCP - Cardiology (Cardiology) Antony Blackbird, MD as Consulting Physician (Radiation Oncology) Abigail Miyamoto, MD as Consulting Physician (General Surgery) Serena Croissant, MD as Consulting Physician (Hematology and Oncology) [name], Arty Baumgartner, NP as Nurse Practitioner (Nurse Practitioner)   I connected with Chelsea Hansen on 02/15/23 at  3:00 PM EDT by phone and verified that I am speaking with the correct person using two identifiers.   I discussed the limitations, risks, security and privacy concerns of performing an evaluation and management service by telemedicine and the availability of in-person appointments. I also discussed with the patient that there may be a patient responsible charge related to this service. The patient expressed understanding and agreed to proceed.   Other persons participating in the visit and their role in the encounter: n/a   Patient's location: home  Provider's location: [facility]   Chief Complaint: f/u of symptom management    INTERVAL HISTORY: Chelsea Hansen is a 52 y.o. 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  MRI of L-spine (12/23) showed L3 pathologic fracture and significant lesion along S1 with sclerotic changes.  Palliative ask to see for symptom management and goals of care.   SOCIAL HISTORY:     reports that she has never smoked. She has never used smokeless  tobacco. She reports that she does not currently use alcohol after a past usage of about 7.0 standard drinks of alcohol per week. She reports that she does not use drugs.  ADVANCE DIRECTIVES:    CODE STATUS:   PAST MEDICAL HISTORY: Past Medical History:  Diagnosis Date   Allergy    seasonal allergies   Anxiety    on meds   Asthma    uses inhaler   Breast cancer (HCC) 2012   RIGHT lumpectomy   Cancer (HCC) 2022   RIGHT breast lump-dx 2022   Depression    on meds   DVT (deep venous thrombosis) (HCC) 2010   after hysterectomy   Family history of uterine cancer    GERD (gastroesophageal reflux disease)    with certain foods/OTC PRN meds   Headache(784.0)    History of radiation therapy    Bilateral breast- 09/03/21-11/02/21- Dr. Antony Blackbird   Hyperlipidemia    on meds   Hypertension    on meds   SVT (supraventricular tachycardia)     ALLERGIES:  has No Known Allergies.  MEDICATIONS:  Current Outpatient Medications  Medication Sig Dispense Refill   abemaciclib (VERZENIO) 50 MG tablet Take 1 tablet (50 mg total) by mouth 2 (two) times daily. 56 tablet 3   albuterol (VENTOLIN HFA) 108 (90 Base) MCG/ACT inhaler Inhale 2 puffs by mouth into the lungs every 6 hours as needed for wheezing or shortness of breath. 18 g 2   anastrozole (ARIMIDEX) 1 MG tablet Take 1 tablet (1 mg total) by mouth daily. 90 tablet 3   calcium-vitamin D (OSCAL WITH D) 500-5 MG-MCG tablet Take 1 tablet by mouth daily with supper. 90 tablet 2   escitalopram (LEXAPRO)  20 MG tablet Take 1 tablet (20 mg total) by mouth daily. 90 tablet 1   hydrochlorothiazide (HYDRODIURIL) 25 MG tablet Take 1 tablet (25 mg total) by mouth daily. HOLD until follow up with your primary doctor 30 tablet 0   montelukast (SINGULAIR) 10 MG tablet Take 1 tablet (10 mg total) by mouth at bedtime. 90 tablet 3   ondansetron (ZOFRAN) 4 MG tablet Take 1 tablet (4 mg total) by mouth every 8 (eight) hours. 90 tablet 11   oxyCODONE ER 9  MG C12A Take 1 capsule by mouth 2 (two) times daily. 60 capsule 0   oxyCODONE-acetaminophen (PERCOCET) 7.5-325 MG tablet Take 1 - 2 tablets by mouth every 6 (six) hours as needed for severe pain. 102 tablet 0   pantoprazole (PROTONIX) 40 MG tablet Take 1 tablet (40 mg total) by mouth daily. 30 tablet 3   polyethylene glycol (MIRALAX / GLYCOLAX) 17 g packet Take 17 g by mouth 2 (two) times daily. 60 each 3   prochlorperazine (COMPAZINE) 10 MG tablet Take 1 tablet (10 mg total) by mouth every 6 (six) hours as needed for nausea or vomiting. 30 tablet 0   propranolol (INDERAL) 10 MG tablet Take 1 tablet (10 mg total) by mouth 2 (two) times daily. 180 tablet 3   senna (SENOKOT) 8.6 MG TABS tablet Take 2 tablets (17.2 mg total) by mouth at bedtime. 60 tablet 3   No current facility-administered medications for this visit.    VITAL SIGNS: There were no vitals taken for this visit. There were no vitals filed for this visit.   Estimated body mass index is 28.25 kg/m as calculated from the following:   Height as of 01/04/23: 5' 11.65" (1.82 m).   Weight as of 01/31/23: 206 lb 4.8 oz (93.6 kg).   PERFORMANCE STATUS (ECOG) : 1 - Symptomatic but completely ambulatory  Assessment NAD RRR Normal breathing pattern  AAO x4  IMPRESSION:  Neoplasm related pain Chelsea Hansen reports pain is controlled on current regimen. Some days continue to be better than others.   We discussed at length her current regimen.  She is taking Xtampza 9 mg every 12 hours and Percocet every 6 hours as needed for breakthrough pain.  Tolerating medications without difficulty. Taking as prescribed. Refills have been appropriate timing based on pill count. No adjustments to be made today.  We will continue to closely monitor.   Pain contract on file.  Nausea/Vomiting  Resolved   Constipation Improved with bowel regimen.   PLAN: Continue Xtampza 9 mg every 12 hours.    Continue Percocet every 6 hours as needed for  breakthrough pain. Not requiring around the clock.  MiraLAX twice daily for bowel regimen Senna daily Protonix 40 mg daily  Patient reports pain is well-controlled. I will plan to see patient back in 4-6 weeks in collaboration with her other oncology appointments. Patient knows to contact our office sooner if needed.   Patient expressed understanding and was in agreement with this plan. She also understands that She can call the clinic at any time with any questions, concerns, or complaints.     Any controlled substances utilized were prescribed in the context of palliative care. PDMP has been reviewed.    Visit consisted of counseling and education dealing with the complex and emotionally intense issues of symptom management and palliative care in the setting of serious and potentially life-threatening illness.Greater than 50%  of this time was spent counseling and coordinating care related to the above  assessment and plan.  Willette Alma, AGPCNP-BC  Palliative Medicine Team/[facility]  *Please note that this is a verbal dictation therefore any spelling or grammatical errors are due to the "Dragon Medical One" system interpretation.

## 2023-02-17 ENCOUNTER — Encounter: Payer: Self-pay | Admitting: Hematology and Oncology

## 2023-02-22 ENCOUNTER — Other Ambulatory Visit (HOSPITAL_COMMUNITY): Payer: Self-pay

## 2023-02-22 NOTE — Progress Notes (Signed)
Palliative Medicine [facility]  Telephone:(336) [phone] [phone]   Name: Chelsea Hansen Date: 02/22/2023 MRN: [id number]  DOB: September 25, 1970  Patient Care Team: Grayce Sessions, NP as PCP - General (Internal Medicine) Thomasene Ripple, DO as PCP - Cardiology (Cardiology) Antony Blackbird, MD as Consulting Physician (Radiation Oncology) Abigail Miyamoto, MD as Consulting Physician (General Surgery) Serena Croissant, MD as Consulting Physician (Hematology and Oncology) [name], Arty Baumgartner, NP as Nurse Practitioner (Nurse Practitioner)   INTERVAL HISTORY: Chelsea Hansen is a 52 y.o. 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  MRI of L-spine (12/23) showed L3 pathologic fracture and significant lesion along S1 with sclerotic changes.  Palliative ask to see for symptom management and goals of care.   SOCIAL HISTORY:     reports that she has never smoked. She has never used smokeless tobacco. She reports that she does not currently use alcohol after a past usage of about 7.0 standard drinks of alcohol per week. She reports that she does not use drugs.  ADVANCE DIRECTIVES:   CODE STATUS:   PAST MEDICAL HISTORY: Past Medical History:  Diagnosis Date   Allergy    seasonal allergies   Anxiety    on meds   Asthma    uses inhaler   Breast cancer (HCC) 2012   RIGHT lumpectomy   Cancer (HCC) 2022   RIGHT breast lump-dx 2022   Depression    on meds   DVT (deep venous thrombosis) (HCC) 2010   after hysterectomy   Family history of uterine cancer    GERD (gastroesophageal reflux disease)    with certain foods/OTC PRN meds   Headache(784.0)    History  of radiation therapy    Bilateral breast- 09/03/21-11/02/21- Dr. Antony Blackbird   Hyperlipidemia    on meds   Hypertension    on meds   SVT (supraventricular tachycardia)     ALLERGIES:  has No Known Allergies.  MEDICATIONS:  Current Outpatient Medications  Medication Sig Dispense Refill   abemaciclib (VERZENIO) 50 MG tablet Take 1 tablet (50 mg total) by mouth 2 (two) times daily. 56 tablet 3   albuterol (VENTOLIN HFA) 108 (90 Base) MCG/ACT inhaler Inhale 2 puffs by mouth into the lungs every 6 hours as needed for wheezing or shortness of breath. 18 g 2   anastrozole (ARIMIDEX) 1 MG tablet Take 1 tablet (1 mg total) by mouth daily. 90 tablet 3   calcium-vitamin D (OSCAL WITH D) 500-5 MG-MCG tablet Take 1 tablet by mouth daily with supper. 90 tablet 2   escitalopram (LEXAPRO) 20 MG tablet Take 1 tablet (20 mg total) by mouth daily. 90 tablet 1   hydrochlorothiazide (HYDRODIURIL) 25 MG tablet Take 1 tablet (25 mg total) by mouth daily. HOLD until follow up with your primary doctor 30 tablet 0   montelukast (SINGULAIR) 10 MG tablet Take 1 tablet (10 mg total) by mouth at bedtime. 90 tablet 3   ondansetron (ZOFRAN) 4 MG tablet Take 1 tablet (4 mg total) by mouth every 8 (eight) hours. 90 tablet 11   oxyCODONE ER 9 MG C12A Take 1 capsule by mouth 2 (two) times daily. 60 capsule 0   oxyCODONE-acetaminophen (PERCOCET) 7.5-325 MG tablet Take 1 - 2 tablets by  mouth every 6 (six) hours as needed for severe pain. 102 tablet 0   pantoprazole (PROTONIX) 40 MG tablet Take 1 tablet (40 mg total) by mouth daily. 30 tablet 3   polyethylene glycol (MIRALAX / GLYCOLAX) 17 g packet Take 17 g by mouth 2 (two) times daily. 60 each 3   prochlorperazine (COMPAZINE) 10 MG tablet Take 1 tablet (10 mg total) by mouth every 6 (six) hours as needed for nausea or vomiting. 30 tablet 0   propranolol (INDERAL) 10 MG tablet Take 1 tablet (10 mg total) by mouth 2 (two) times daily. 180 tablet 3   senna (SENOKOT) 8.6 MG  TABS tablet Take 2 tablets (17.2 mg total) by mouth at bedtime. 60 tablet 3   No current facility-administered medications for this visit.    VITAL SIGNS: There were no vitals taken for this visit. There were no vitals filed for this visit.   Estimated body mass index is 28.25 kg/m as calculated from the following:   Height as of 01/04/23: 5' 11.65" (1.82 m).   Weight as of 01/31/23: 206 lb 4.8 oz (93.6 kg).   PERFORMANCE STATUS (ECOG) : 1 - Symptomatic but completely ambulatory  Assessment NAD RRR Normal breathing pattern  AAO x4  IMPRESSION: Chelsea Hansen presents to clinic today for symptom management follow-up.  No acute distress noted.  Denies nausea, vomiting, constipation, or diarrhea.  Family is present with her on today.  Is trying to remain as active as possible.  Continues to actively work.  She is planning to complete advanced directives as she has been putting this off for some time. She has an appointment on Monday with AD clinic however is also going to go to bank today and may complete at that time.    Neoplasm related pain Chelsea Hansen reports pain is controlled on current regimen. Some days continue to be better than others.   We discussed at length her current regimen.  She is taking Xtampza 9 mg every 12 hours and Percocet every 6 hours as needed for breakthrough pain.  Tolerating medications without difficulty. Taking as prescribed. Refills have been appropriate timing based on pill count. No adjustments to be made today.  We will continue to closely monitor.   Chelsea Hansen is inquiring about changes in her co-pay with medications.  Advised based on discussions with pharmacy her insurance has provided limitations on amount of medication and pricing of medication co-pay.  Advised her to contact her insurance and discussed recent changes as we have no control over this but will support her in any way that we can.  She verbalized understanding and appreciation.  Hopeful patient can  pursue ostial cool procedure which can also assist with pain management.  Pain contract on file.  Nausea/Vomiting  Resolved   Constipation Improved with bowel regimen.   PLAN: Continue Xtampza 9 mg every 12 hours.    Continue Percocet every 6 hours as needed for breakthrough pain. Not requiring around the clock.  MiraLAX twice daily for bowel regimen Senna daily Protonix 40 mg daily  Patient reports pain is well-controlled. I will plan to see patient back in 4-6 weeks in collaboration with her other oncology appointments. Patient knows to contact our office sooner if needed.   Patient expressed understanding and was in agreement with this plan. She also understands that She can call the clinic at any time with any questions, concerns, or complaints.     Any controlled substances utilized were prescribed in the context of palliative  care. PDMP has been reviewed.    Visit consisted of counseling and education dealing with the complex and emotionally intense issues of symptom management and palliative care in the setting of serious and potentially life-threatening illness.Greater than 50%  of this time was spent counseling and coordinating care related to the above assessment and plan.  Chelsea Hansen, AGPCNP-BC  Palliative Medicine Team/[facility]  *Please note that this is a verbal dictation therefore any spelling or grammatical errors are due to the "Dragon Medical One" system interpretation.

## 2023-02-24 ENCOUNTER — Encounter: Payer: Self-pay | Admitting: Nurse Practitioner

## 2023-02-24 ENCOUNTER — Inpatient Hospital Stay: Payer: MEDICAID

## 2023-02-24 ENCOUNTER — Inpatient Hospital Stay (HOSPITAL_BASED_OUTPATIENT_CLINIC_OR_DEPARTMENT_OTHER): Payer: MEDICAID | Admitting: Nurse Practitioner

## 2023-02-24 ENCOUNTER — Inpatient Hospital Stay: Payer: MEDICAID | Attending: Adult Health

## 2023-02-24 VITALS — BP 128/72 | HR 78 | Temp 98.2°F | Resp 18

## 2023-02-24 VITALS — BP 158/100 | HR 59 | Temp 97.5°F | Resp 18 | Ht 71.65 in | Wt 204.2 lb

## 2023-02-24 DIAGNOSIS — C50411 Malignant neoplasm of upper-outer quadrant of right female breast: Secondary | ICD-10-CM | POA: Diagnosis present

## 2023-02-24 DIAGNOSIS — G893 Neoplasm related pain (acute) (chronic): Secondary | ICD-10-CM | POA: Diagnosis not present

## 2023-02-24 DIAGNOSIS — Z170 Estrogen receptor positive status [ER+]: Secondary | ICD-10-CM

## 2023-02-24 DIAGNOSIS — K5903 Drug induced constipation: Secondary | ICD-10-CM | POA: Diagnosis not present

## 2023-02-24 DIAGNOSIS — Z515 Encounter for palliative care: Secondary | ICD-10-CM

## 2023-02-24 DIAGNOSIS — C50412 Malignant neoplasm of upper-outer quadrant of left female breast: Secondary | ICD-10-CM | POA: Insufficient documentation

## 2023-02-24 DIAGNOSIS — C7951 Secondary malignant neoplasm of bone: Secondary | ICD-10-CM | POA: Insufficient documentation

## 2023-02-24 DIAGNOSIS — R112 Nausea with vomiting, unspecified: Secondary | ICD-10-CM | POA: Insufficient documentation

## 2023-02-24 DIAGNOSIS — K5900 Constipation, unspecified: Secondary | ICD-10-CM | POA: Insufficient documentation

## 2023-02-24 DIAGNOSIS — Z95828 Presence of other vascular implants and grafts: Secondary | ICD-10-CM

## 2023-02-24 DIAGNOSIS — R530 Neoplastic (malignant) related fatigue: Secondary | ICD-10-CM

## 2023-02-24 LAB — CMP (CANCER CENTER ONLY)
ALT: 17 U/L (ref 0–44)
AST: 18 U/L (ref 15–41)
Albumin: 4 g/dL (ref 3.5–5.0)
Alkaline Phosphatase: 52 U/L (ref 38–126)
Anion gap: 4 — ABNORMAL LOW (ref 5–15)
BUN: 13 mg/dL (ref 6–20)
CO2: 31 mmol/L (ref 22–32)
Calcium: 8.7 mg/dL — ABNORMAL LOW (ref 8.9–10.3)
Chloride: 106 mmol/L (ref 98–111)
Creatinine: 0.76 mg/dL (ref 0.44–1.00)
GFR, Estimated: >60 mL/min (ref >60)
Glucose, Bld: 91 mg/dL (ref 70–99)
Potassium: 3.8 mmol/L (ref 3.5–5.1)
Sodium: 141 mmol/L (ref 135–145)
Total Bilirubin: 0.4 mg/dL (ref 0.3–1.2)
Total Protein: 6.9 g/dL (ref 6.5–8.1)

## 2023-02-24 LAB — CBC WITH DIFFERENTIAL (CANCER CENTER ONLY)
Abs Immature Granulocytes: 0.02 10*3/uL (ref 0.00–0.07)
Basophils Absolute: 0 10*3/uL (ref 0.0–0.1)
Basophils Relative: 1 %
Eosinophils Absolute: 0.1 10*3/uL (ref 0.0–0.5)
Eosinophils Relative: 2 %
HCT: 31.1 % — ABNORMAL LOW (ref 36.0–46.0)
Hemoglobin: 9.7 g/dL — ABNORMAL LOW (ref 12.0–15.0)
Immature Granulocytes: 1 %
Lymphocytes Relative: 19 %
Lymphs Abs: 0.7 10*3/uL (ref 0.7–4.0)
MCH: 24.6 pg — ABNORMAL LOW (ref 26.0–34.0)
MCHC: 31.2 g/dL (ref 30.0–36.0)
MCV: 78.7 fL — ABNORMAL LOW (ref 80.0–100.0)
Monocytes Absolute: 0.4 10*3/uL (ref 0.1–1.0)
Monocytes Relative: 11 %
Neutro Abs: 2.4 10*3/uL (ref 1.7–7.7)
Neutrophils Relative %: 66 %
Platelet Count: 236 10*3/uL (ref 150–400)
RBC: 3.95 MIL/uL (ref 3.87–5.11)
RDW: 15.5 % (ref 11.5–15.5)
WBC Count: 3.6 10*3/uL — ABNORMAL LOW (ref 4.0–10.5)
nRBC: 0 % (ref 0.0–0.2)

## 2023-02-24 MED ORDER — DENOSUMAB 120 MG/1.7ML ~~LOC~~ SOLN
120.0000 mg | Freq: Once | SUBCUTANEOUS | Status: AC
  Administered 2023-02-24: 120 mg via SUBCUTANEOUS

## 2023-02-24 NOTE — Progress Notes (Signed)
Pt in [facility] lobby asking to speak with nurse for Dr Pamelia Hoit as she has questions.  She is asking about cost for consult kyphoplasty/osteo cool procedure. She states she was told it would cost her $300 out of pocket.  Spoke with IR to find out [facility] has to perform consult, but that her insurance is not in network with [employer]; therefore, she would have to pay out of pocket cost $180 (with the 40% discount for out of pocket cost) then she could have procedure done at [facility] hospital, which her insurance will cover. She is going to think about it and let me know if she wants to move forward so I can advise IR to r/s her consult.   She is also asking about CT that needs to be performed in December. Order entered per MD and we will have MD f/u scheduled.

## 2023-02-24 NOTE — Progress Notes (Signed)
Ok to proceed with xgeva with Calcium of 8.7 per Dr. Pamelia Hoit

## 2023-02-25 ENCOUNTER — Encounter: Payer: Self-pay | Admitting: Hematology and Oncology

## 2023-02-25 ENCOUNTER — Other Ambulatory Visit (HOSPITAL_COMMUNITY): Payer: Self-pay

## 2023-02-28 ENCOUNTER — Inpatient Hospital Stay: Payer: MEDICAID | Admitting: General Practice

## 2023-02-28 NOTE — Progress Notes (Signed)
[facility] Spiritual Care Note  Assisted Chelsea Hansen, accompanied by her son Ethelene Browns, in [employer].  Chelsea Hansen completed her AD, selecting her two sons as health care agents:  Luciano Cutter [phone]) of [facility] and Emilie Rutter [phone]) of [place]. She stated that she wishes to be kept alive as long as possible. In the event of a conflict in instructions, she initialed "Follow the health care agent."  Original and three completed copies to patient; one to Health Information Management for scanning into Electronic Medical Record.   [address] [place], [place], [facility] Pager [phone] Voicemail [phone]

## 2023-03-01 ENCOUNTER — Other Ambulatory Visit (HOSPITAL_COMMUNITY): Payer: Self-pay

## 2023-03-01 ENCOUNTER — Inpatient Hospital Stay: Payer: MEDICAID | Admitting: Nutrition

## 2023-03-01 NOTE — Progress Notes (Signed)
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 Pamelia Hoit.  Patient is status post surgery and chemotherapy.  She also had adjuvant radiation and is on anastrozole for 5 to 7 years.  Past medical history includes anxiety, depression, DVT, GERD, hyperlipidemia, and hypertension.  Labs include albumin 4.0  Height: 5 feet 11 inches. Weight: 204 pounds 3 ounces on September 5. Usual body weight: 220 pounds per patient. Patient weighed 214 pounds December 07, 2022. BMI: 27.96.  Patient complains of very poor appetite and is concerned with weight loss.  She reports she does not eat much.  She does not tolerate milk.  She does not like Ensure.  States she has nausea and vomiting approximately once a week.  She is having a loose stool twice a day.  She continues to work.  Nutrition diagnosis:  Unintentional weight loss related to 5% loss in less than 3 months.  This is not clinically significant but it is concerning.  Intervention: Educated on importance of increasing food intake in small amounts throughout the day. Reviewed ways to add extra calories and protein. Suggested patient try Ensure clear or equivalent. Will mail fact sheet and coupons.  Home address confirmed.  Monitoring, evaluation, goals: Patient will tolerate increased calories and protein to minimize further weight loss.  Next visit: To be scheduled as needed.  **Disclaimer: This note was dictated with voice recognition software. Similar sounding words can inadvertently be transcribed and this note may contain transcription errors which may not have been corrected upon publication of note.**

## 2023-03-04 ENCOUNTER — Other Ambulatory Visit: Payer: Self-pay

## 2023-03-04 NOTE — Progress Notes (Signed)
Chelsea Hansen [birth year] [id number]   Patient outreached by Thomasene Ripple, PharmD Candidate to discuss hypertension. Patient reports not being on any BP medications, did not follow up on HTN.  Thomasene Ripple, Student-PharmD

## 2023-03-10 ENCOUNTER — Other Ambulatory Visit: Payer: Self-pay | Admitting: Adult Health

## 2023-03-10 ENCOUNTER — Other Ambulatory Visit (HOSPITAL_COMMUNITY): Payer: Self-pay

## 2023-03-10 DIAGNOSIS — R2231 Localized swelling, mass and lump, right upper limb: Secondary | ICD-10-CM

## 2023-03-11 ENCOUNTER — Other Ambulatory Visit (HOSPITAL_COMMUNITY): Payer: Self-pay

## 2023-03-11 ENCOUNTER — Other Ambulatory Visit: Payer: Self-pay

## 2023-03-11 DIAGNOSIS — Z515 Encounter for palliative care: Secondary | ICD-10-CM

## 2023-03-11 DIAGNOSIS — C50411 Malignant neoplasm of upper-outer quadrant of right female breast: Secondary | ICD-10-CM

## 2023-03-11 DIAGNOSIS — G893 Neoplasm related pain (acute) (chronic): Secondary | ICD-10-CM

## 2023-03-11 DIAGNOSIS — C7951 Secondary malignant neoplasm of bone: Secondary | ICD-10-CM

## 2023-03-11 MED ORDER — OXYCODONE-ACETAMINOPHEN 7.5-325 MG PO TABS
1.0000 | ORAL_TABLET | Freq: Four times a day (QID) | ORAL | 0 refills | Status: DC | PRN
Start: 2023-03-11 — End: 2023-03-28
  Filled 2023-03-11: qty 102, 13d supply, fill #0

## 2023-03-11 NOTE — Telephone Encounter (Signed)
Pt called for refills, see orders

## 2023-03-15 ENCOUNTER — Ambulatory Visit
Admission: RE | Admit: 2023-03-15 | Discharge: 2023-03-15 | Disposition: A | Discharge status: Home / Self Care | Payer: MEDICAID | Source: Ambulatory Visit | Attending: Adult Health | Admitting: Adult Health

## 2023-03-15 DIAGNOSIS — R2231 Localized swelling, mass and lump, right upper limb: Secondary | ICD-10-CM

## 2023-03-16 ENCOUNTER — Other Ambulatory Visit: Payer: Self-pay

## 2023-03-18 ENCOUNTER — Other Ambulatory Visit: Payer: Self-pay

## 2023-03-18 ENCOUNTER — Encounter (INDEPENDENT_AMBULATORY_CARE_PROVIDER_SITE_OTHER): Payer: Self-pay | Admitting: Primary Care

## 2023-03-18 ENCOUNTER — Ambulatory Visit (INDEPENDENT_AMBULATORY_CARE_PROVIDER_SITE_OTHER): Payer: 59 | Admitting: Primary Care

## 2023-03-18 ENCOUNTER — Other Ambulatory Visit (HOSPITAL_COMMUNITY): Payer: Self-pay

## 2023-03-18 VITALS — BP 155/98 | HR 68 | Temp 98.4°F | Resp 16 | Wt 208.4 lb

## 2023-03-18 DIAGNOSIS — R062 Wheezing: Secondary | ICD-10-CM | POA: Diagnosis not present

## 2023-03-18 DIAGNOSIS — J9801 Acute bronchospasm: Secondary | ICD-10-CM

## 2023-03-18 DIAGNOSIS — R0989 Other specified symptoms and signs involving the circulatory and respiratory systems: Secondary | ICD-10-CM | POA: Diagnosis not present

## 2023-03-18 MED ORDER — ALBUTEROL SULFATE HFA 108 (90 BASE) MCG/ACT IN AERS
2.0000 | INHALATION_SPRAY | Freq: Four times a day (QID) | RESPIRATORY_TRACT | 2 refills | Status: DC | PRN
  Filled 2023-03-18 (×2): qty 18, 25d supply, fill #0
  Filled 2023-11-18: qty 18, 25d supply, fill #1
  Filled 2023-11-19: qty 18, 25d supply, fill #0

## 2023-03-18 NOTE — Progress Notes (Signed)
    [facility]        Subjective:     Ms.Trinda SANIAH SCHROETER is a 52 y.o. female who presents for evaluation of symptoms of a URI. Symptoms include achiness, congestion, and cough described as green . Onset of symptoms was 2 days ago, and has been gradually worsening since that time. Treatment to date: decongestants.  Patient has been having fever last checked it was 99.6, dry cough with congestion phelm  green, headache, sore throat and chills.  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 Blood pressure is elevated more than normal secondary to stress of what she may have caught questing COVID.  She has a Radio broadcast assistant that has been out of work twice with COVID.  Up to this date he has not had COVID.  The following portions of the patient's history were reviewed and updated as appropriate: allergies, current medications, past family history, past medical history, past social history, past surgical history, and problem list.  Review of Systems Pertinent items noted in HPI and remainder of comprehensive ROS otherwise negative.   Objective:  BP (!) 155/98 (BP Location: Left Arm, Patient Position: Sitting, Cuff Size: Normal)   Pulse 68   Temp 98.4 F (36.9 C)   Resp 16   Wt 208 lb 6.4 oz (94.5 kg)   SpO2 98%   BMI 28.54 kg/m     BP (!) 155/98 (BP Location: Left Arm, Patient Position: Sitting, Cuff Size: Normal)   Pulse 68   Temp 98.4 F (36.9 C)   Resp 16   Wt 208 lb 6.4 oz (94.5 kg)   SpO2 98%   BMI 28.54 kg/m  General appearance: mild distress Head: Normocephalic, without obvious abnormality, atraumatic Eyes: conjunctivae/corneas clear. PERRL, EOM's intact. Fundi benign. Nose: Nares are red swollen an bogy  Septum midline. Mucosa normal. Drainage and maxillary sinus tenderness. Neck: no adenopathy, no carotid bruit, no JVD, supple, symmetrical, trachea midline, and thyroid not enlarged, symmetric, no  tenderness/mass/nodules Lungs: wheezes base - right and RUL Heart: regular rate and rhythm, S1, S2 normal, no murmur, click, rub or gallop Abdomen: soft, non-tender; bowel sounds normal; no masses,  no organomegaly Extremities: extremities normal, atraumatic, no cyanosis or edema Pulses: 2+ and symmetric Skin: Skin color, texture, turgor normal. No rashes or lesions   Assessment:  Florentina was seen today for uri.  Diagnoses and all orders for this visit:  Respiratory symptoms  -     Respiratory Panel w/ SARS-CoV2   Oluwateniola was seen today for uri.  Diagnoses and all orders for this visit:  Respiratory symptoms -     Respiratory Panel w/ SARS-CoV2  Bronchospasm 2/2 Wheezing Albuterol HFA prn      This note has been created with Education officer, environmental. Any transcriptional errors are unintentional.   Grayce Sessions, NP 03/18/2023, 10:49 AM

## 2023-03-19 LAB — RESPIRATORY PANEL W/ SARS-COV2

## 2023-03-21 ENCOUNTER — Ambulatory Visit (INDEPENDENT_AMBULATORY_CARE_PROVIDER_SITE_OTHER): Payer: Self-pay | Admitting: *Deleted

## 2023-03-21 ENCOUNTER — Telehealth: Payer: Self-pay | Admitting: Primary Care

## 2023-03-21 ENCOUNTER — Other Ambulatory Visit (INDEPENDENT_AMBULATORY_CARE_PROVIDER_SITE_OTHER): Payer: MEDICAID

## 2023-03-21 DIAGNOSIS — R0989 Other specified symptoms and signs involving the circulatory and respiratory systems: Secondary | ICD-10-CM

## 2023-03-21 NOTE — Telephone Encounter (Signed)
Looks like order was cancelled. Pt came into the office today for a lab visit for COVID test

## 2023-03-21 NOTE — Telephone Encounter (Signed)
Copied from CRM [id number]. Topic: General - Inquiry >> Mar 21, 2023  8:21 AM Clide Dales wrote: Patient called to see why the Covid test she took Friday was cancelled. Patient states she was taken out of work by provider and she needs to know what to do. Please advise.

## 2023-03-21 NOTE — Telephone Encounter (Signed)
Opened chart to answer question for agent.   She is calling in for lab results however there are no results interpreted.   It looks like the resp. Panel test was cancelled 3 days ago.   Since not able to answer question why it was cancelled requested agent send to the practice.

## 2023-03-24 LAB — COVID-19, FLU A+B AND RSV
Influenza A, NAA: NOT DETECTED
Influenza B, NAA: NOT DETECTED
RSV, NAA: NOT DETECTED
SARS-CoV-2, NAA: NOT DETECTED

## 2023-03-28 ENCOUNTER — Other Ambulatory Visit (HOSPITAL_COMMUNITY): Payer: Self-pay

## 2023-03-28 ENCOUNTER — Other Ambulatory Visit: Payer: Self-pay | Admitting: Nurse Practitioner

## 2023-03-28 DIAGNOSIS — Z515 Encounter for palliative care: Secondary | ICD-10-CM

## 2023-03-28 DIAGNOSIS — C7951 Secondary malignant neoplasm of bone: Secondary | ICD-10-CM

## 2023-03-28 DIAGNOSIS — C50411 Malignant neoplasm of upper-outer quadrant of right female breast: Secondary | ICD-10-CM

## 2023-03-28 DIAGNOSIS — G893 Neoplasm related pain (acute) (chronic): Secondary | ICD-10-CM

## 2023-03-28 MED ORDER — OXYCODONE-ACETAMINOPHEN 7.5-325 MG PO TABS
1.0000 | ORAL_TABLET | Freq: Four times a day (QID) | ORAL | 0 refills | Status: DC | PRN
Start: 2023-03-28 — End: 2023-04-19
  Filled 2023-03-28: qty 102, 13d supply, fill #0

## 2023-03-30 ENCOUNTER — Other Ambulatory Visit: Payer: Self-pay

## 2023-04-01 ENCOUNTER — Other Ambulatory Visit: Payer: Self-pay

## 2023-04-01 ENCOUNTER — Other Ambulatory Visit (HOSPITAL_COMMUNITY): Payer: Self-pay | Admitting: Pharmacy Technician

## 2023-04-01 ENCOUNTER — Other Ambulatory Visit (HOSPITAL_COMMUNITY): Payer: Self-pay

## 2023-04-01 NOTE — Progress Notes (Signed)
Specialty Pharmacy Refill Coordination Note  Chelsea Hansen is a 52 y.o. female contacted today regarding refills of specialty medication(s) Abemaciclib   Patient requested [name] at [facility] at [place] date: 01-30-2072   Medication will be filled on 04/05/23.   Called & spoke with patient about Pick up date she enter her DOB. & is okay with picking up on 04/06/23

## 2023-04-05 ENCOUNTER — Other Ambulatory Visit: Payer: Self-pay

## 2023-04-10 NOTE — Progress Notes (Deleted)
Palliative Medicine [facility]  Telephone:(336) [phone] Fax:(336) [phone]   Name: Chelsea Hansen Date: 04/10/2023 MRN: [id number]  DOB: 1970-12-29  Patient Care Team: Grayce Sessions, NP as PCP - General (Internal Medicine) Thomasene Ripple, DO as PCP - Cardiology (Cardiology) Antony Blackbird, MD as Consulting Physician (Radiation Oncology) Abigail Miyamoto, MD as Consulting Physician (General Surgery) Serena Croissant, MD as Consulting Physician (Hematology and Oncology) [name], Arty Baumgartner, NP as Nurse Practitioner (Nurse Practitioner)   I connected with [name] on 04/10/23 at  2:20 PM EDT by phone and verified that I am speaking with the correct person using two identifiers.   I discussed the limitations, risks, security and privacy concerns of performing an evaluation and management service by telemedicine and the availability of in-person appointments. I also discussed with the patient that there may be a patient responsible charge related to this service. The patient expressed understanding and agreed to proceed.   Other persons participating in the visit and their role in the encounter: n/a   Patient's location: home  Provider's location: [facility]   Chief Complaint: f/u of symptom management   INTERVAL HISTORY: Chelsea Hansen is a 52 y.o. 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  MRI of L-spine (12/23) showed L3 pathologic fracture and significant lesion along S1 with sclerotic changes.  Palliative ask to see for symptom management and goals of care.   SOCIAL HISTORY:     reports that she has never smoked. She has never used smokeless  tobacco. She reports that she does not currently use alcohol after a past usage of about 7.0 standard drinks of alcohol per week. She reports that she does not use drugs.  ADVANCE DIRECTIVES:   CODE STATUS:   PAST MEDICAL HISTORY: Past Medical History:  Diagnosis Date   Allergy    seasonal allergies   Anxiety    on meds   Asthma    uses inhaler   Breast cancer (HCC) 2012   RIGHT lumpectomy   Cancer (HCC) 2022   RIGHT breast lump-dx 2022   Depression    on meds   DVT (deep venous thrombosis) (HCC) 2010   after hysterectomy   Family history of uterine cancer    GERD (gastroesophageal reflux disease)    with certain foods/OTC PRN meds   Headache(784.0)    History of radiation therapy    Bilateral breast- 09/03/21-11/02/21- Dr. Antony Blackbird   Hyperlipidemia    on meds   Hypertension    on meds   SVT (supraventricular tachycardia)     ALLERGIES:  has No Known Allergies.  MEDICATIONS:  Current Outpatient Medications  Medication Sig Dispense Refill   abemaciclib (VERZENIO) 50 MG tablet Take 1 tablet (50 mg total) by mouth 2 (two) times daily. 56 tablet 3   albuterol (VENTOLIN HFA) 108 (90 Base) MCG/ACT inhaler Inhale 2 puffs by mouth into the lungs every 6 hours as needed for wheezing or shortness of breath. 18 g 2   anastrozole (ARIMIDEX) 1 MG tablet Take 1 tablet (1 mg total) by mouth daily. 90 tablet 3   calcium-vitamin D (OSCAL WITH D) 500-5 MG-MCG tablet Take 1 tablet by mouth daily with supper. 90 tablet 2   escitalopram (LEXAPRO) 20 MG  tablet Take 1 tablet (20 mg total) by mouth daily. 90 tablet 1   hydrochlorothiazide (HYDRODIURIL) 25 MG tablet Take 1 tablet (25 mg total) by mouth daily. HOLD until follow up with your primary doctor 30 tablet 0   montelukast (SINGULAIR) 10 MG tablet Take 1 tablet (10 mg total) by mouth at bedtime. 90 tablet 3   ondansetron (ZOFRAN) 4 MG tablet Take 1 tablet (4 mg total) by mouth every 8 (eight) hours. 90 tablet 11   oxyCODONE ER 9 MG  C12A Take 1 capsule by mouth 2 (two) times daily. 60 capsule 0   oxyCODONE-acetaminophen (PERCOCET) 7.5-325 MG tablet Take 1 - 2 tablets by mouth every 6 (six) hours as needed for severe pain. 102 tablet 0   pantoprazole (PROTONIX) 40 MG tablet Take 1 tablet (40 mg total) by mouth daily. 30 tablet 3   polyethylene glycol (MIRALAX / GLYCOLAX) 17 g packet Take 17 g by mouth 2 (two) times daily. 60 each 3   prochlorperazine (COMPAZINE) 10 MG tablet Take 1 tablet (10 mg total) by mouth every 6 (six) hours as needed for nausea or vomiting. 30 tablet 0   propranolol (INDERAL) 10 MG tablet Take 1 tablet (10 mg total) by mouth 2 (two) times daily. 180 tablet 3   senna (SENOKOT) 8.6 MG TABS tablet Take 2 tablets (17.2 mg total) by mouth at bedtime. 60 tablet 3   No current facility-administered medications for this visit.    VITAL SIGNS: There were no vitals taken for this visit. There were no vitals filed for this visit.   Estimated body mass index is 28.54 kg/m as calculated from the following:   Height as of 02/24/23: 5' 11.65" (1.82 m).   Weight as of 03/18/23: 208 lb 6.4 oz (94.5 kg).   PERFORMANCE STATUS (ECOG) : 1 - Symptomatic but completely ambulatory  Assessment NAD RRR Normal breathing pattern  AAO x4  IMPRESSION:    Neoplasm related pain Chelsea Hansen reports pain is controlled on current regimen. Some days continue to be better than others.   We discussed at length her current regimen.  She is taking Xtampza 9 mg every 12 hours and Percocet every 6 hours as needed for breakthrough pain.  Tolerating medications without difficulty. Taking as prescribed. Refills have been appropriate timing based on pill count. No adjustments to be made today.  We will continue to closely monitor.   Chelsea Hansen is inquiring about changes in her co-pay with medications.  Advised based on discussions with pharmacy her insurance has provided limitations on amount of medication and pricing of medication co-pay.   Advised her to contact her insurance and discussed recent changes as we have no control over this but will support her in any way that we can.  She verbalized understanding and appreciation.  Hopeful patient can pursue ostial cool procedure which can also assist with pain management.  Pain contract on file.  Nausea/Vomiting  Resolved   Constipation Improved with bowel regimen.   PLAN: Continue Xtampza 9 mg every 12 hours.    Continue Percocet every 6 hours as needed for breakthrough pain. Not requiring around the clock.  MiraLAX twice daily for bowel regimen Senna daily Protonix 40 mg daily  Patient reports pain is well-controlled. I will plan to see patient back in 4-6 weeks in collaboration with her other oncology appointments. Patient knows to contact our office sooner if needed.   Patient expressed understanding and was in agreement with this plan. She also understands that She  can call the clinic at any time with any questions, concerns, or complaints.     Any controlled substances utilized were prescribed in the context of palliative care. PDMP has been reviewed.    Visit consisted of counseling and education dealing with the complex and emotionally intense issues of symptom management and palliative care in the setting of serious and potentially life-threatening illness.Greater than 50%  of this time was spent counseling and coordinating care related to the above assessment and plan.  Willette Alma, AGPCNP-BC  Palliative Medicine Team/[facility]  *Please note that this is a verbal dictation therefore any spelling or grammatical errors are due to the "Dragon Medical One" system interpretation.

## 2023-04-11 ENCOUNTER — Encounter: Payer: Self-pay | Admitting: Hematology and Oncology

## 2023-04-11 ENCOUNTER — Other Ambulatory Visit: Payer: Self-pay | Admitting: Nurse Practitioner

## 2023-04-11 ENCOUNTER — Inpatient Hospital Stay: Payer: MEDICAID | Admitting: Nurse Practitioner

## 2023-04-11 ENCOUNTER — Encounter: Payer: Self-pay | Admitting: Nurse Practitioner

## 2023-04-11 DIAGNOSIS — Z170 Estrogen receptor positive status [ER+]: Secondary | ICD-10-CM

## 2023-04-11 DIAGNOSIS — G893 Neoplasm related pain (acute) (chronic): Secondary | ICD-10-CM

## 2023-04-11 DIAGNOSIS — Z515 Encounter for palliative care: Secondary | ICD-10-CM

## 2023-04-11 DIAGNOSIS — C7951 Secondary malignant neoplasm of bone: Secondary | ICD-10-CM

## 2023-04-15 ENCOUNTER — Encounter: Payer: Self-pay | Admitting: Hematology and Oncology

## 2023-04-15 ENCOUNTER — Other Ambulatory Visit (HOSPITAL_COMMUNITY): Payer: Self-pay

## 2023-04-18 ENCOUNTER — Other Ambulatory Visit (INDEPENDENT_AMBULATORY_CARE_PROVIDER_SITE_OTHER): Payer: Self-pay | Admitting: Primary Care

## 2023-04-18 ENCOUNTER — Other Ambulatory Visit: Payer: Self-pay | Admitting: Nurse Practitioner

## 2023-04-18 ENCOUNTER — Other Ambulatory Visit (HOSPITAL_COMMUNITY): Payer: Self-pay

## 2023-04-18 ENCOUNTER — Other Ambulatory Visit: Payer: Self-pay | Admitting: Cardiology

## 2023-04-18 ENCOUNTER — Other Ambulatory Visit: Payer: Self-pay | Admitting: Adult Health

## 2023-04-18 DIAGNOSIS — G893 Neoplasm related pain (acute) (chronic): Secondary | ICD-10-CM

## 2023-04-18 DIAGNOSIS — Z515 Encounter for palliative care: Secondary | ICD-10-CM

## 2023-04-18 DIAGNOSIS — Z170 Estrogen receptor positive status [ER+]: Secondary | ICD-10-CM

## 2023-04-18 DIAGNOSIS — C7951 Secondary malignant neoplasm of bone: Secondary | ICD-10-CM

## 2023-04-18 DIAGNOSIS — J4541 Moderate persistent asthma with (acute) exacerbation: Secondary | ICD-10-CM

## 2023-04-18 MED ORDER — ANASTROZOLE 1 MG PO TABS
1.0000 mg | ORAL_TABLET | Freq: Every day | ORAL | 3 refills | Status: DC
Start: 2023-04-18 — End: 2024-02-06
  Filled 2023-04-18: qty 90, 90d supply, fill #0
  Filled 2023-09-25: qty 90, 90d supply, fill #1

## 2023-04-18 MED ORDER — PROPRANOLOL HCL 10 MG PO TABS
10.0000 mg | ORAL_TABLET | Freq: Two times a day (BID) | ORAL | 0 refills | Status: DC
  Filled 2023-04-18: qty 60, 30d supply, fill #0

## 2023-04-19 ENCOUNTER — Inpatient Hospital Stay: Payer: 59 | Attending: Adult Health | Admitting: Nurse Practitioner

## 2023-04-19 ENCOUNTER — Encounter: Payer: Self-pay | Admitting: Nurse Practitioner

## 2023-04-19 ENCOUNTER — Other Ambulatory Visit (HOSPITAL_COMMUNITY): Payer: Self-pay

## 2023-04-19 ENCOUNTER — Telehealth: Payer: Self-pay | Admitting: Nurse Practitioner

## 2023-04-19 ENCOUNTER — Other Ambulatory Visit: Payer: Self-pay

## 2023-04-19 DIAGNOSIS — Z515 Encounter for palliative care: Secondary | ICD-10-CM

## 2023-04-19 DIAGNOSIS — G893 Neoplasm related pain (acute) (chronic): Secondary | ICD-10-CM

## 2023-04-19 DIAGNOSIS — C50411 Malignant neoplasm of upper-outer quadrant of right female breast: Secondary | ICD-10-CM | POA: Diagnosis not present

## 2023-04-19 DIAGNOSIS — Z170 Estrogen receptor positive status [ER+]: Secondary | ICD-10-CM

## 2023-04-19 DIAGNOSIS — C7951 Secondary malignant neoplasm of bone: Secondary | ICD-10-CM | POA: Diagnosis not present

## 2023-04-19 MED ORDER — OXYCODONE-ACETAMINOPHEN 7.5-325 MG PO TABS
1.0000 | ORAL_TABLET | Freq: Four times a day (QID) | ORAL | 0 refills | Status: DC | PRN
Start: 2023-04-19 — End: 2023-05-04
  Filled 2023-04-19: qty 102, 13d supply, fill #0

## 2023-04-19 MED ORDER — OXYCODONE ER 9 MG PO C12A
9.0000 mg | EXTENDED_RELEASE_CAPSULE | Freq: Two times a day (BID) | ORAL | 0 refills | Status: DC
  Filled 2023-04-28 (×2): qty 60, 30d supply, fill #0

## 2023-04-19 NOTE — Telephone Encounter (Signed)
Requested medication (s) are due for refill today: expired medication  Requested medication (s) are on the active medication list: yes   Last refill:  02/20/21 #90 3 refills   Future visit scheduled: no   Notes to clinic:  expired medication. Do you want to renew Rx?     Requested Prescriptions  Pending Prescriptions Disp Refills   montelukast (SINGULAIR) 10 MG tablet 90 tablet 3    Sig: Take 1 tablet (10 mg total) by mouth at bedtime.     Pulmonology:  Leukotriene Inhibitors Passed - 04/18/2023  3:25 PM      Passed - Valid encounter within last 12 months    Recent Outpatient Visits           1 month ago Respiratory symptoms   [place] [facility] Grayce Sessions, NP   7 months ago Cervical cancer screening   [place] [facility] Grayce Sessions, NP   9 months ago Hospital discharge follow-up   [place] [facility] Grayce Sessions, NP   1 year ago Essential hypertension, benign   [place] [facility] Grayce Sessions, NP   1 year ago Essential hypertension, benign   [place] [facility] Grayce Sessions, NP

## 2023-04-19 NOTE — Progress Notes (Unsigned)
Palliative Medicine [facility]  Telephone:(336) [phone] Fax:(336) [phone]   Name: Chelsea Hansen Date: 04/19/2023 MRN: [id number]  DOB: 1971/03/07  Patient Care Team: Grayce Sessions, NP as PCP - General (Internal Medicine) Thomasene Ripple, DO as PCP - Cardiology (Cardiology) Antony Blackbird, MD as Consulting Physician (Radiation Oncology) Abigail Miyamoto, MD as Consulting Physician (General Surgery) Serena Croissant, MD as Consulting Physician (Hematology and Oncology) [name], Arty Baumgartner, NP as Nurse Practitioner (Nurse Practitioner)   I connected with Chelsea Hansen on 04/19/23 at  2:00 PM EDT by phone and verified that I am speaking with the correct person using two identifiers.   I discussed the limitations, risks, security and privacy concerns of performing an evaluation and management service by telemedicine and the availability of in-person appointments. I also discussed with the patient that there may be a patient responsible charge related to this service. The patient expressed understanding and agreed to proceed.   Other persons participating in the visit and their role in the encounter: n/a   Patient's location: home  Provider's location: [facility]   Chief Complaint: f/u of symptom management   INTERVAL HISTORY: Chelsea Hansen is a 52 y.o. 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  MRI of L-spine (12/23) showed L3 pathologic fracture and significant lesion along S1 with sclerotic changes.  Palliative ask to see for symptom management and goals of care.   SOCIAL HISTORY:     reports that she has never smoked. She has never used smokeless  tobacco. She reports that she does not currently use alcohol after a past usage of about 7.0 standard drinks of alcohol per week. She reports that she does not use drugs.  ADVANCE DIRECTIVES:   CODE STATUS:   PAST MEDICAL HISTORY: Past Medical History:  Diagnosis Date   Allergy    seasonal allergies   Anxiety    on meds   Asthma    uses inhaler   Breast cancer (HCC) 2012   RIGHT lumpectomy   Cancer (HCC) 2022   RIGHT breast lump-dx 2022   Depression    on meds   DVT (deep venous thrombosis) (HCC) 2010   after hysterectomy   Family history of uterine cancer    GERD (gastroesophageal reflux disease)    with certain foods/OTC PRN meds   Headache(784.0)    History of radiation therapy    Bilateral breast- 09/03/21-11/02/21- Dr. Antony Blackbird   Hyperlipidemia    on meds   Hypertension    on meds   SVT (supraventricular tachycardia) (HCC)     ALLERGIES:  has No Known Allergies.  MEDICATIONS:  Current Outpatient Medications  Medication Sig Dispense Refill   abemaciclib (VERZENIO) 50 MG tablet Take 1 tablet (50 mg total) by mouth 2 (two) times daily. 56 tablet 3   albuterol (VENTOLIN HFA) 108 (90 Base) MCG/ACT inhaler Inhale 2 puffs by mouth into the lungs every 6 hours as needed for wheezing or shortness of breath. 18 g 2   anastrozole (ARIMIDEX) 1 MG tablet Take 1 tablet (1 mg total) by mouth daily. 90 tablet 3   calcium-vitamin D (OSCAL WITH D) 500-5 MG-MCG tablet Take 1 tablet by mouth daily with supper. 90 tablet 2   escitalopram (LEXAPRO) 20  MG tablet Take 1 tablet (20 mg total) by mouth daily. 90 tablet 1   hydrochlorothiazide (HYDRODIURIL) 25 MG tablet Take 1 tablet (25 mg total) by mouth daily. HOLD until follow up with your primary doctor 30 tablet 0   montelukast (SINGULAIR) 10 MG tablet Take 1 tablet (10 mg total) by mouth at bedtime. 90 tablet 3   ondansetron (ZOFRAN) 4 MG tablet Take 1 tablet (4 mg total) by mouth every 8 (eight) hours. 90 tablet 11   oxyCODONE  ER 9 MG C12A Take 1 capsule by mouth 2 (two) times daily. 60 capsule 0   oxyCODONE-acetaminophen (PERCOCET) 7.5-325 MG tablet Take 1 - 2 tablets by mouth every 6 (six) hours as needed for severe pain. 102 tablet 0   pantoprazole (PROTONIX) 40 MG tablet Take 1 tablet (40 mg total) by mouth daily. 30 tablet 3   polyethylene glycol (MIRALAX / GLYCOLAX) 17 g packet Take 17 g by mouth 2 (two) times daily. 60 each 3   prochlorperazine (COMPAZINE) 10 MG tablet Take 1 tablet (10 mg total) by mouth every 6 (six) hours as needed for nausea or vomiting. 30 tablet 0   propranolol (INDERAL) 10 MG tablet Take 1 tablet (10 mg total) by mouth 2 (two) times daily. 1st attempt, patient needs an appt for additional refills 60 tablet 0   senna (SENOKOT) 8.6 MG TABS tablet Take 2 tablets (17.2 mg total) by mouth at bedtime. 60 tablet 3   No current facility-administered medications for this visit.    VITAL SIGNS: There were no vitals taken for this visit. There were no vitals filed for this visit.   Estimated body mass index is 28.54 kg/m as calculated from the following:   Height as of 02/24/23: 5' 11.65" (1.82 m).   Weight as of 03/18/23: 208 lb 6.4 oz (94.5 kg).   PERFORMANCE STATUS (ECOG) : 1 - Symptomatic but completely ambulatory  Assessment NAD RRR Normal breathing pattern  AAO x4  IMPRESSION:    Neoplasm related pain Chelsea Hansen reports pain is controlled on current regimen. Some days continue to be better than others.   We discussed at length her current regimen.  She is taking Xtampza 9 mg every 12 hours and Percocet every 6 hours as needed for breakthrough pain.  Tolerating medications without difficulty. Taking as prescribed. Refills have been appropriate timing based on pill count. No adjustments to be made today.  We will continue to closely monitor.   Sundi is inquiring about changes in her co-pay with medications.  Advised based on discussions with pharmacy her insurance has provided  limitations on amount of medication and pricing of medication co-pay.  Advised her to contact her insurance and discussed recent changes as we have no control over this but will support her in any way that we can.  She verbalized understanding and appreciation.  Hopeful patient can pursue ostial cool procedure which can also assist with pain management.  Pain contract on file.  Nausea/Vomiting  Resolved   Constipation Improved with bowel regimen.   PLAN: Continue Xtampza 9 mg every 12 hours.    Continue Percocet every 6 hours as needed for breakthrough pain. Not requiring around the clock.  MiraLAX twice daily for bowel regimen Senna daily Protonix 40 mg daily  Patient reports pain is well-controlled. I will plan to see patient back in 4-6 weeks in collaboration with her other oncology appointments. Patient knows to contact our office sooner if needed.   Patient expressed understanding and was  in agreement with this plan. She also understands that She can call the clinic at any time with any questions, concerns, or complaints.     Any controlled substances utilized were prescribed in the context of palliative care. PDMP has been reviewed.    Visit consisted of counseling and education dealing with the complex and emotionally intense issues of symptom management and palliative care in the setting of serious and potentially life-threatening illness.Greater than 50%  of this time was spent counseling and coordinating care related to the above assessment and plan.  Chelsea Hansen, AGPCNP-BC  Palliative Medicine Team/[facility]  *Please note that this is a verbal dictation therefore any spelling or grammatical errors are due to the "Dragon Medical One" system interpretation.

## 2023-04-20 ENCOUNTER — Other Ambulatory Visit (HOSPITAL_COMMUNITY): Payer: Self-pay

## 2023-04-20 ENCOUNTER — Encounter: Payer: Self-pay | Admitting: Hematology and Oncology

## 2023-04-25 NOTE — Telephone Encounter (Signed)
Will forward to provider  

## 2023-04-28 ENCOUNTER — Other Ambulatory Visit (HOSPITAL_COMMUNITY): Payer: Self-pay

## 2023-04-28 ENCOUNTER — Other Ambulatory Visit: Payer: Self-pay

## 2023-05-01 MED ORDER — MONTELUKAST SODIUM 10 MG PO TABS
10.0000 mg | ORAL_TABLET | Freq: Every day | ORAL | 3 refills | Status: DC
Start: 2023-05-01 — End: 2024-02-01
  Filled 2023-05-01: qty 90, 90d supply, fill #0
  Filled 2023-09-25: qty 90, 90d supply, fill #1

## 2023-05-02 ENCOUNTER — Other Ambulatory Visit (HOSPITAL_COMMUNITY): Payer: Self-pay

## 2023-05-02 ENCOUNTER — Telehealth: Payer: MEDICAID

## 2023-05-02 ENCOUNTER — Other Ambulatory Visit: Payer: Self-pay

## 2023-05-04 ENCOUNTER — Other Ambulatory Visit: Payer: Self-pay

## 2023-05-04 ENCOUNTER — Other Ambulatory Visit (HOSPITAL_COMMUNITY): Payer: Self-pay

## 2023-05-04 ENCOUNTER — Other Ambulatory Visit: Payer: Self-pay | Admitting: Nurse Practitioner

## 2023-05-04 DIAGNOSIS — Z170 Estrogen receptor positive status [ER+]: Secondary | ICD-10-CM

## 2023-05-04 DIAGNOSIS — C7951 Secondary malignant neoplasm of bone: Secondary | ICD-10-CM

## 2023-05-04 DIAGNOSIS — Z515 Encounter for palliative care: Secondary | ICD-10-CM

## 2023-05-04 DIAGNOSIS — G893 Neoplasm related pain (acute) (chronic): Secondary | ICD-10-CM

## 2023-05-04 MED ORDER — OXYCODONE-ACETAMINOPHEN 7.5-325 MG PO TABS
1.0000 | ORAL_TABLET | Freq: Four times a day (QID) | ORAL | 0 refills | Status: DC | PRN
  Filled 2023-05-04: qty 102, 13d supply, fill #0

## 2023-05-05 ENCOUNTER — Other Ambulatory Visit (HOSPITAL_COMMUNITY): Payer: Self-pay

## 2023-05-09 ENCOUNTER — Other Ambulatory Visit: Payer: Self-pay

## 2023-05-09 NOTE — Progress Notes (Signed)
Specialty Pharmacy Refill Coordination Note  Chelsea Hansen is a 52 y.o. female contacted today regarding refills of specialty medication(s) Abemaciclib   Patient requested [name] at [facility] at [place] date: 05/12/23   Medication will be filled on 05/11/23.

## 2023-05-09 NOTE — Progress Notes (Signed)
Specialty Pharmacy Ongoing Clinical Assessment Note  Chelsea Hansen is a 52 y.o. female who is being followed by the specialty pharmacy service for RxSp Oncology   Patient's specialty medication(s) reviewed today: Abemaciclib   Missed doses in the last 4 weeks: 0   Patient/Caregiver did not have any additional questions or concerns.   Therapeutic benefit summary: Unable to assess   Adverse events/side effects summary: No adverse events/side effects   Patient's therapy is appropriate to: Continue    Goals Addressed             This Visit's Progress    Stabilization of disease       Patient is on track. Patient will maintain adherence         Follow up:  6 months  [name] Specialty Pharmacist

## 2023-05-12 ENCOUNTER — Encounter: Payer: Self-pay | Admitting: Hematology and Oncology

## 2023-05-12 ENCOUNTER — Other Ambulatory Visit: Payer: Self-pay | Admitting: Cardiology

## 2023-05-12 ENCOUNTER — Other Ambulatory Visit (HOSPITAL_COMMUNITY): Payer: Self-pay

## 2023-05-13 ENCOUNTER — Other Ambulatory Visit (HOSPITAL_COMMUNITY): Payer: Self-pay

## 2023-05-13 MED ORDER — PROPRANOLOL HCL 10 MG PO TABS
10.0000 mg | ORAL_TABLET | Freq: Two times a day (BID) | ORAL | 0 refills | Status: DC
  Filled 2023-05-13: qty 30, 15d supply, fill #0

## 2023-05-17 ENCOUNTER — Other Ambulatory Visit (HOSPITAL_COMMUNITY): Payer: Self-pay

## 2023-05-17 ENCOUNTER — Ambulatory Visit (INDEPENDENT_AMBULATORY_CARE_PROVIDER_SITE_OTHER): Payer: Self-pay

## 2023-05-17 DIAGNOSIS — C50919 Malignant neoplasm of unspecified site of unspecified female breast: Secondary | ICD-10-CM | POA: Diagnosis not present

## 2023-05-17 DIAGNOSIS — R32 Unspecified urinary incontinence: Secondary | ICD-10-CM | POA: Diagnosis not present

## 2023-05-17 DIAGNOSIS — Z87891 Personal history of nicotine dependence: Secondary | ICD-10-CM | POA: Diagnosis not present

## 2023-05-17 DIAGNOSIS — M810 Age-related osteoporosis without current pathological fracture: Secondary | ICD-10-CM | POA: Diagnosis not present

## 2023-05-17 DIAGNOSIS — Z823 Family history of stroke: Secondary | ICD-10-CM | POA: Diagnosis not present

## 2023-05-17 DIAGNOSIS — F259 Schizoaffective disorder, unspecified: Secondary | ICD-10-CM | POA: Diagnosis not present

## 2023-05-17 DIAGNOSIS — Z833 Family history of diabetes mellitus: Secondary | ICD-10-CM | POA: Diagnosis not present

## 2023-05-17 DIAGNOSIS — Z809 Family history of malignant neoplasm, unspecified: Secondary | ICD-10-CM | POA: Diagnosis not present

## 2023-05-17 DIAGNOSIS — Z8249 Family history of ischemic heart disease and other diseases of the circulatory system: Secondary | ICD-10-CM | POA: Diagnosis not present

## 2023-05-17 DIAGNOSIS — Z8673 Personal history of transient ischemic attack (TIA), and cerebral infarction without residual deficits: Secondary | ICD-10-CM | POA: Diagnosis not present

## 2023-05-17 DIAGNOSIS — K219 Gastro-esophageal reflux disease without esophagitis: Secondary | ICD-10-CM | POA: Diagnosis not present

## 2023-05-17 DIAGNOSIS — I10 Essential (primary) hypertension: Secondary | ICD-10-CM | POA: Diagnosis not present

## 2023-05-17 NOTE — Telephone Encounter (Signed)
  Chief Complaint: chest pain Symptoms: R breast and middle of chest pain, leg pain, burning sensation in feet  Frequency: 2 weeks  Pertinent Negatives: NA Disposition: [] ED /[] Urgent Care (no appt availability in office) / [x] Appointment(In office/virtual)/ []  [facility]/ [] Home Care/ [] Refused Recommended Disposition /[] [place] [facility]/ []  Follow-up with PCP Additional Notes: pt cancer pt and currently gets chemo, reported sx, was going to call Oncology provider as well. Pt doesn't want to go to ED, wanted to schedule appt. First available not until 05/26/23, pt accepted and scheduled. Advised if sx get worse before then to go to ED and be seen. Pt verbalized understanding.   Reason for Disposition  Cancer treatment in past six months (or has cancer now)  Answer Assessment - Initial Assessment Questions 1. LOCATION: "Where does it hurt?"       Under R breast and middle of chest  2. RADIATION: "Does the pain go anywhere else?" (e.g., into neck, jaw, arms, back)     no 3. ONSET: "When did the chest pain begin?" (Minutes, hours or days)      2 weeks  4. PATTERN: "Does the pain come and go, or has it been constant since it started?"  "Does it get worse with exertion?"      Comes and goes  5. DURATION: "How long does it last" (e.g., seconds, minutes, hours)     10 mins  6. SEVERITY: "How bad is the pain?"  (e.g., Scale 1-10; mild, moderate, or severe)    - MILD (1-3): doesn't interfere with normal activities     - MODERATE (4-7): interferes with normal activities or awakens from sleep    - SEVERE (8-10): excruciating pain, unable to do any normal activities       9/10 7. CARDIAC RISK FACTORS: "Do you have any history of heart problems or risk factors for heart disease?" (e.g., angina, prior heart attack; diabetes, high blood pressure, high cholesterol, smoker, or strong family history of heart disease)     HTN 160/92 10. OTHER SYMPTOMS: "Do you have any other symptoms?"  (e.g., dizziness, nausea, vomiting, sweating, fever, difficulty breathing, cough)       Leg pain, bottom of feet burning  Protocols used: Chest Pain-A-AH

## 2023-05-18 ENCOUNTER — Other Ambulatory Visit: Payer: Self-pay | Admitting: Nurse Practitioner

## 2023-05-18 ENCOUNTER — Encounter: Payer: Self-pay | Admitting: Hematology and Oncology

## 2023-05-18 ENCOUNTER — Other Ambulatory Visit: Payer: Self-pay

## 2023-05-18 ENCOUNTER — Other Ambulatory Visit (HOSPITAL_COMMUNITY): Payer: Self-pay

## 2023-05-18 DIAGNOSIS — C7951 Secondary malignant neoplasm of bone: Secondary | ICD-10-CM

## 2023-05-18 DIAGNOSIS — Z170 Estrogen receptor positive status [ER+]: Secondary | ICD-10-CM

## 2023-05-18 DIAGNOSIS — G893 Neoplasm related pain (acute) (chronic): Secondary | ICD-10-CM

## 2023-05-18 DIAGNOSIS — Z515 Encounter for palliative care: Secondary | ICD-10-CM

## 2023-05-18 MED ORDER — OXYCODONE-ACETAMINOPHEN 7.5-325 MG PO TABS
1.0000 | ORAL_TABLET | Freq: Four times a day (QID) | ORAL | 0 refills | Status: DC | PRN
  Filled 2023-05-18: qty 102, 13d supply, fill #0

## 2023-05-20 NOTE — Progress Notes (Signed)
Palliative Medicine [facility]  [phone] [phone]   Name: Chelsea Hansen Date: 05/20/2023 MRN: [id number]  DOB: 1971-03-31  Patient Care Team: Grayce Sessions, NP as PCP - General (Internal Medicine) Thomasene Ripple, DO as PCP - Cardiology (Cardiology) Antony Blackbird, MD as Consulting Physician (Radiation Oncology) Abigail Miyamoto, MD as Consulting Physician (General Surgery) Serena Croissant, MD as Consulting Physician (Hematology and Oncology) [name], Arty Baumgartner, NP as Nurse Practitioner (Nurse Practitioner)   INTERVAL HISTORY: Chelsea Hansen is a 52 y.o. 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  MRI of L-spine (12/23) showed L3 pathologic fracture and significant lesion along S1 with sclerotic changes.  Palliative ask to see for symptom management and goals of care.   SOCIAL HISTORY:     reports that she has never smoked. She has never used smokeless tobacco. She reports that she does not currently use alcohol after a past usage of about 7.0 standard drinks of alcohol per week. She reports that she does not use drugs.  ADVANCE DIRECTIVES:   CODE STATUS:   PAST MEDICAL HISTORY: Past Medical History:  Diagnosis Date   Allergy    seasonal allergies   Anxiety    on meds   Asthma    uses inhaler   Breast cancer (HCC) 2012   RIGHT lumpectomy   Cancer (HCC) 2022   RIGHT breast lump-dx 2022   Depression    on meds   DVT (deep venous thrombosis) (HCC) 2010   after hysterectomy   Family history of uterine cancer    GERD (gastroesophageal reflux disease)    with certain foods/OTC PRN meds   Headache(784.0)     History of radiation therapy    Bilateral breast- 09/03/21-11/02/21- Dr. Antony Blackbird   Hyperlipidemia    on meds   Hypertension    on meds   SVT (supraventricular tachycardia) (HCC)     ALLERGIES:  has No Known Allergies.  MEDICATIONS:  Current Outpatient Medications  Medication Sig Dispense Refill   abemaciclib (VERZENIO) 50 MG tablet Take 1 tablet (50 mg total) by mouth 2 (two) times daily. 56 tablet 3   albuterol (VENTOLIN HFA) 108 (90 Base) MCG/ACT inhaler Inhale 2 puffs by mouth into the lungs every 6 hours as needed for wheezing or shortness of breath. 18 g 2   anastrozole (ARIMIDEX) 1 MG tablet Take 1 tablet (1 mg total) by mouth daily. 90 tablet 3   calcium-vitamin D (OSCAL WITH D) 500-5 MG-MCG tablet Take 1 tablet by mouth daily with supper. 90 tablet 2   escitalopram (LEXAPRO) 20 MG tablet Take 1 tablet (20 mg total) by mouth daily. 90 tablet 1   hydrochlorothiazide (HYDRODIURIL) 25 MG tablet Take 1 tablet (25 mg total) by mouth daily. HOLD until follow up with your primary doctor 30 tablet 0   montelukast (SINGULAIR) 10 MG tablet Take 1 tablet (10 mg total) by mouth at bedtime. 90 tablet 3   ondansetron (ZOFRAN) 4 MG tablet Take 1 tablet (4 mg total) by mouth every 8 (eight) hours. 90 tablet 11   oxyCODONE ER 9 MG C12A Take 1 capsule by mouth 2 (two) times daily. 60 capsule 0   oxyCODONE-acetaminophen (PERCOCET) 7.5-325 MG tablet Take 1 - 2 tablets  by mouth every 6 (six) hours as needed for severe pain. 102 tablet 0   pantoprazole (PROTONIX) 40 MG tablet Take 1 tablet (40 mg total) by mouth daily. 30 tablet 3   polyethylene glycol (MIRALAX / GLYCOLAX) 17 g packet Take 17 g by mouth 2 (two) times daily. 60 each 3   prochlorperazine (COMPAZINE) 10 MG tablet Take 1 tablet (10 mg total) by mouth every 6 (six) hours as needed for nausea or vomiting. 30 tablet 0   propranolol (INDERAL) 10 MG tablet Take 1 tablet (10 mg total) by mouth 2 (two) times daily. Please call [phone] to  schedule an appointment for future refills. Thank you. 2nd attempt. 30 tablet 0   senna (SENOKOT) 8.6 MG TABS tablet Take 2 tablets (17.2 mg total) by mouth at bedtime. 60 tablet 3   No current facility-administered medications for this visit.    VITAL SIGNS: There were no vitals taken for this visit. There were no vitals filed for this visit.   Estimated body mass index is 28.54 kg/m as calculated from the following:   Height as of 02/24/23: 5' 11.65" (1.82 m).   Weight as of 03/18/23: 208 lb 6.4 oz (94.5 kg).   PERFORMANCE STATUS (ECOG) : 1 - Symptomatic but completely ambulatory  Assessment NAD RRR Normal breathing pattern AAO x3  IMPRESSION: Chelsea Hansen presented to clinic for follow-up. No acute distress. She is doing well overall. Denies vomiting, constipation, diarrhea. Some fatigue. Is remaining as active as possible.   Neoplasm related pain Chelsea Hansen reports pain is controlled on current regimen. Some days continue to be better than others.   We discussed at length her current regimen.  She is taking Xtampza 9 mg every 12 hours and Percocet every 6 hours as needed for breakthrough pain.  Tolerating medications without difficulty. Taking as prescribed. Refills have been appropriate timing based on pill count. No adjustments to be made today.  We will continue to closely monitor. Reminded patient of refill policy in regards to adhering to scheduled appointments.   Pain contract on file.  Constipation Improved with bowel regimen.   PLAN: Continue Xtampza 9 mg every 12 hours.    Continue Percocet every 6 hours as needed for breakthrough pain. Not requiring around the clock.  MiraLAX twice daily for bowel regimen Senna daily Protonix 40 mg daily  Patient reports pain is well-controlled. I will plan to see patient back in 4-6 weeks in collaboration with her other oncology appointments. Patient knows to contact our office sooner if needed.   Patient expressed understanding and  was in agreement with this plan. She also understands that She can call the clinic at any time with any questions, concerns, or complaints.     Any controlled substances utilized were prescribed in the context of palliative care. PDMP has been reviewed.    Visit consisted of counseling and education dealing with the complex and emotionally intense issues of symptom management and palliative care in the setting of serious and potentially life-threatening illness.  Willette Alma, AGPCNP-BC  Palliative Medicine Team/[facility]

## 2023-05-21 ENCOUNTER — Other Ambulatory Visit (HOSPITAL_COMMUNITY): Payer: Self-pay

## 2023-05-25 ENCOUNTER — Other Ambulatory Visit: Payer: Self-pay | Admitting: *Deleted

## 2023-05-25 DIAGNOSIS — C50411 Malignant neoplasm of upper-outer quadrant of right female breast: Secondary | ICD-10-CM

## 2023-05-26 ENCOUNTER — Encounter (INDEPENDENT_AMBULATORY_CARE_PROVIDER_SITE_OTHER): Payer: Self-pay | Admitting: Primary Care

## 2023-05-26 ENCOUNTER — Inpatient Hospital Stay: Payer: 59 | Attending: Adult Health | Admitting: Hematology and Oncology

## 2023-05-26 ENCOUNTER — Other Ambulatory Visit (HOSPITAL_COMMUNITY): Payer: Self-pay

## 2023-05-26 ENCOUNTER — Inpatient Hospital Stay: Payer: 59

## 2023-05-26 ENCOUNTER — Encounter: Payer: Self-pay | Admitting: Nurse Practitioner

## 2023-05-26 ENCOUNTER — Other Ambulatory Visit: Payer: Self-pay

## 2023-05-26 ENCOUNTER — Inpatient Hospital Stay (HOSPITAL_BASED_OUTPATIENT_CLINIC_OR_DEPARTMENT_OTHER): Payer: 59 | Admitting: Nurse Practitioner

## 2023-05-26 ENCOUNTER — Ambulatory Visit (INDEPENDENT_AMBULATORY_CARE_PROVIDER_SITE_OTHER): Payer: 59 | Admitting: Primary Care

## 2023-05-26 VITALS — BP 161/80 | HR 56 | Temp 98.2°F | Resp 18 | Ht 71.65 in | Wt 204.6 lb

## 2023-05-26 VITALS — BP 154/85 | HR 48 | Resp 16 | Wt 205.4 lb

## 2023-05-26 DIAGNOSIS — R110 Nausea: Secondary | ICD-10-CM | POA: Diagnosis not present

## 2023-05-26 DIAGNOSIS — C50412 Malignant neoplasm of upper-outer quadrant of left female breast: Secondary | ICD-10-CM | POA: Diagnosis not present

## 2023-05-26 DIAGNOSIS — Z515 Encounter for palliative care: Secondary | ICD-10-CM

## 2023-05-26 DIAGNOSIS — C7951 Secondary malignant neoplasm of bone: Secondary | ICD-10-CM

## 2023-05-26 DIAGNOSIS — I890 Lymphedema, not elsewhere classified: Secondary | ICD-10-CM | POA: Insufficient documentation

## 2023-05-26 DIAGNOSIS — G893 Neoplasm related pain (acute) (chronic): Secondary | ICD-10-CM | POA: Diagnosis not present

## 2023-05-26 DIAGNOSIS — Z1721 Progesterone receptor positive status: Secondary | ICD-10-CM | POA: Diagnosis not present

## 2023-05-26 DIAGNOSIS — I10 Essential (primary) hypertension: Secondary | ICD-10-CM

## 2023-05-26 DIAGNOSIS — Z170 Estrogen receptor positive status [ER+]: Secondary | ICD-10-CM

## 2023-05-26 DIAGNOSIS — C50411 Malignant neoplasm of upper-outer quadrant of right female breast: Secondary | ICD-10-CM | POA: Insufficient documentation

## 2023-05-26 DIAGNOSIS — Z5941 Food insecurity: Secondary | ICD-10-CM

## 2023-05-26 DIAGNOSIS — Z79811 Long term (current) use of aromatase inhibitors: Secondary | ICD-10-CM | POA: Insufficient documentation

## 2023-05-26 DIAGNOSIS — Z95828 Presence of other vascular implants and grafts: Secondary | ICD-10-CM

## 2023-05-26 DIAGNOSIS — G629 Polyneuropathy, unspecified: Secondary | ICD-10-CM | POA: Insufficient documentation

## 2023-05-26 LAB — CMP (CANCER CENTER ONLY)
ALT: 17 U/L (ref 0–44)
AST: 17 U/L (ref 15–41)
Albumin: 4.1 g/dL (ref 3.5–5.0)
Alkaline Phosphatase: 65 U/L (ref 38–126)
Anion gap: 4 — ABNORMAL LOW (ref 5–15)
BUN: 17 mg/dL (ref 6–20)
CO2: 32 mmol/L (ref 22–32)
Calcium: 9.4 mg/dL (ref 8.9–10.3)
Chloride: 104 mmol/L (ref 98–111)
Creatinine: 0.78 mg/dL (ref 0.44–1.00)
GFR, Estimated: >60 mL/min (ref >60)
Glucose, Bld: 94 mg/dL (ref 70–99)
Potassium: 3.9 mmol/L (ref 3.5–5.1)
Sodium: 140 mmol/L (ref 135–145)
Total Bilirubin: 0.3 mg/dL (ref <1.2)
Total Protein: 7.1 g/dL (ref 6.5–8.1)

## 2023-05-26 LAB — CBC WITH DIFFERENTIAL (CANCER CENTER ONLY)
Abs Immature Granulocytes: 0.02 10*3/uL (ref 0.00–0.07)
Basophils Absolute: 0 10*3/uL (ref 0.0–0.1)
Basophils Relative: 1 %
Eosinophils Absolute: 0.1 10*3/uL (ref 0.0–0.5)
Eosinophils Relative: 2 %
HCT: 32.7 % — ABNORMAL LOW (ref 36.0–46.0)
Hemoglobin: 10.2 g/dL — ABNORMAL LOW (ref 12.0–15.0)
Immature Granulocytes: 1 %
Lymphocytes Relative: 24 %
Lymphs Abs: 1.1 10*3/uL (ref 0.7–4.0)
MCH: 24.3 pg — ABNORMAL LOW (ref 26.0–34.0)
MCHC: 31.2 g/dL (ref 30.0–36.0)
MCV: 77.9 fL — ABNORMAL LOW (ref 80.0–100.0)
Monocytes Absolute: 0.5 10*3/uL (ref 0.1–1.0)
Monocytes Relative: 11 %
Neutro Abs: 2.8 10*3/uL (ref 1.7–7.7)
Neutrophils Relative %: 61 %
Platelet Count: 217 10*3/uL (ref 150–400)
RBC: 4.2 MIL/uL (ref 3.87–5.11)
RDW: 16.2 % — ABNORMAL HIGH (ref 11.5–15.5)
WBC Count: 4.4 10*3/uL (ref 4.0–10.5)
nRBC: 0 % (ref 0.0–0.2)

## 2023-05-26 MED ORDER — GABAPENTIN 300 MG PO CAPS
300.0000 mg | ORAL_CAPSULE | Freq: Three times a day (TID) | ORAL | 6 refills | Status: DC
  Filled 2023-05-26: qty 90, 30d supply, fill #0
  Filled 2023-06-28 – 2023-06-29 (×2): qty 90, 30d supply, fill #1
  Filled 2023-07-25: qty 90, 30d supply, fill #2
  Filled 2023-08-30 – 2023-09-25 (×2): qty 90, 30d supply, fill #3
  Filled 2023-10-21: qty 90, 30d supply, fill #4
  Filled 2024-01-18: qty 90, 30d supply, fill #5

## 2023-05-26 MED ORDER — AMLODIPINE BESYLATE 5 MG PO TABS
5.0000 mg | ORAL_TABLET | Freq: Every day | ORAL | 0 refills | Status: DC
  Filled 2023-05-26: qty 90, 90d supply, fill #0

## 2023-05-26 MED ORDER — OXYCODONE ER 9 MG PO C12A
9.0000 mg | EXTENDED_RELEASE_CAPSULE | Freq: Two times a day (BID) | ORAL | 0 refills | Status: DC
  Filled 2023-05-26: qty 60, 30d supply, fill #0

## 2023-05-26 MED ORDER — DENOSUMAB 120 MG/1.7ML ~~LOC~~ SOLN
120.0000 mg | Freq: Once | SUBCUTANEOUS | Status: AC
  Administered 2023-05-26: 120 mg via SUBCUTANEOUS
  Filled 2023-05-26: qty 1.7

## 2023-05-26 NOTE — Progress Notes (Signed)
Patient Care Team: Grayce Sessions, NP as PCP - General (Internal Medicine) Thomasene Ripple, DO as PCP - Cardiology (Cardiology) Antony Blackbird, MD as Consulting Physician (Radiation Oncology) Abigail Miyamoto, MD as Consulting Physician (General Surgery) Serena Croissant, MD as Consulting Physician (Hematology and Oncology) [name], Arty Baumgartner, NP as Nurse Practitioner (Nurse Practitioner)  DIAGNOSIS:  Encounter Diagnosis  Name Primary?   Malignant neoplasm of upper-outer quadrant of right breast in female, estrogen receptor positive (HCC) Yes    SUMMARY OF ONCOLOGIC HISTORY: Oncology History  Malignant neoplasm of upper-outer quadrant of right breast in female, estrogen receptor positive (HCC)  12/12/2020 Initial Diagnosis   status post bilateral breast biopsies 12/03/2020, showing             (1) on the right, a clinical T2 N1, stage IIa invasive lobular carcinoma, grade 2, estrogen and progesterone receptor strongly positive, HER2 not amplified, with an MIB-1 of 40%                         (a) the biopsied right axillary lymph node was positive with extracapsular extension                         (b) a second right breast mass also biopsied was a fibroadenoma, concordant  MAMMAPRINT tested on biopsy returned high risk, luminal type B, indicating significant benefit from chemo                         (c) biopsy of an area of non-mass-like enhancement in the upper right breast pending   12/17/2020 Cancer Staging   Staging form: Breast, AJCC 8th Edition - Clinical stage from 12/17/2020: Stage IIA (cT2, cN1, cM0, G2, ER+, PR+, HER2-) - Signed by Loa Socks, NP on 01/14/2022 Stage prefix: Initial diagnosis Histologic grading system: 3 grade system Laterality: Right Staged by: Pathologist and managing physician Stage used in treatment planning: Yes National guidelines used in treatment planning: Yes Type of national guideline used in treatment planning: NCCN    12/24/2020 Genetic Testing   Negative genetic testing:  No pathogenic variants detected on the [employer] BRCAplus panel (report date 12/24/2020) or the CancerNext-Expanded + RNAinsight panel (report date 12/31/2020). 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   The BRCAplus panel offered by [employer] and includes sequencing and deletion/duplication analysis for the following 8 genes: ATM, BRCA1, BRCA2, CDH1, CHEK2, PALB2, PTEN, and TP53. The CancerNext-Expanded + RNAinsight gene panel offered by [employer] and includes sequencing and rearrangement analysis for the following 77 genes: AIP, ALK, APC, ATM, AXIN2, BAP1, BARD1, BLM, BMPR1A, BRCA1, BRCA2, BRIP1, CDC73, CDH1, CDK4, CDKN1B, CDKN2A, CHEK2, CTNNA1, DICER1, FANCC, FH, FLCN, GALNT12, KIF1B, LZTR1, MAX, MEN1, MET, MLH1, MSH2, MSH3, MSH6, MUTYH, NBN, NF1, NF2, NTHL1, PALB2, PHOX2B, PMS2, POT1, PRKAR1A, PTCH1, PTEN, RAD51C, RAD51D, RB1, RECQL, RET, SDHA, SDHAF2, SDHB, SDHC, SDHD, SMAD4, SMARCA4, SMARCB1, SMARCE1, STK11, SUFU, TMEM127, TP53, TSC1, TSC2, VHL and XRCC2 (sequencing and deletion/duplication); EGFR, EGLN1, HOXB13, KIT, MITF, PDGFRA, POLD1 and POLE (sequencing only); EPCAM and GREM1 (deletion/duplication only). RNA data is routinely analyzed for use in variant interpretation for all genes.   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 Multifocal sclerotic bony metastatic disease greatest in the thoracic spine with scattered rib lesions and sclerosis as well. 2. No nodal or visceral metastasis about the chest. 3. Collateral pathways about the chest suggest some subclavian venous narrowing on the LEFT. This could also be related to arm position. Correlate clinically. 4. Signs of RIGHT breast lumpectomy. Potential postoperative changes in the LEFT breast with small focal area of nodularity in the lateral LEFT breast which could also be postoperative, correlate clinically and consider mammographic correlation as warranted. 5. Mild cardiac enlargement.   05/18/2022 Treatment Plan Change   Anastrozole + Verzenio 100mg  PO BID   12/07/2022 Cancer Staging   Staging form: Breast, AJCC 8th Edition - Pathologic: Stage IV (cM1) - Signed by Loa Socks, NP on 12/07/2022   Malignant neoplasm of upper-outer quadrant of left breast in female, estrogen receptor positive (HCC)  12/12/2020 Initial Diagnosis   on the left, a clinical T1b N0, stage IA invasive ductal carcinoma, grade 1 or 2, estrogen and progesterone receptor positive, HER2 not amplified, with an MIB 1 of 15%   12/17/2020 Cancer Staging   Staging form: Breast, AJCC 8th Edition - Clinical stage from 12/17/2020: Stage IA (cT1b, cN0, cM0, G2, ER+, PR+, HER2-) - Signed by Loa Socks, NP on 01/14/2022 Stage prefix: Initial diagnosis Histologic grading system: 3 grade system Laterality: Left Staged by: Pathologist and managing physician Stage used in treatment  planning: Yes National guidelines used in treatment planning: Yes Type of national guideline used in treatment planning: NCCN   12/24/2020 Genetic Testing   Negative genetic testing:  No pathogenic variants detected on the [employer] BRCAplus panel (report date 12/24/2020) or the CancerNext-Expanded + RNAinsight panel (report date 12/31/2020). 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   The BRCAplus panel offered by [employer] and includes sequencing and deletion/duplication analysis for the following 8 genes: ATM, BRCA1, BRCA2, CDH1, CHEK2, PALB2, PTEN, and TP53. The CancerNext-Expanded + RNAinsight gene panel offered by [employer] and includes sequencing and rearrangement analysis for the following 77 genes: AIP, ALK, APC, ATM, AXIN2, BAP1, BARD1, BLM, BMPR1A, BRCA1, BRCA2, BRIP1, CDC73, CDH1, CDK4, CDKN1B, CDKN2A, CHEK2, CTNNA1, DICER1, FANCC, FH, FLCN, GALNT12, KIF1B, LZTR1, MAX, MEN1, MET, MLH1, MSH2, MSH3, MSH6, MUTYH, NBN, NF1, NF2, NTHL1, PALB2, PHOX2B, PMS2, POT1, PRKAR1A, PTCH1, PTEN, RAD51C, RAD51D, RB1, RECQL, RET, SDHA, SDHAF2, SDHB, SDHC, SDHD, SMAD4, SMARCA4, SMARCB1, SMARCE1, STK11, SUFU, TMEM127, TP53, TSC1, TSC2, VHL and XRCC2 (sequencing and deletion/duplication); EGFR, EGLN1, HOXB13, KIT, MITF, PDGFRA, POLD1 and POLE (sequencing only); EPCAM and GREM1 (deletion/duplication only). RNA data is routinely analyzed for use in variant interpretation for all genes.   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 Multifocal sclerotic bony metastatic disease greatest in the thoracic spine with scattered rib lesions and sclerosis as well. 2. No nodal or visceral metastasis about the chest. 3. Collateral pathways about the chest suggest some subclavian venous narrowing on the LEFT. This could also be related to arm position. Correlate clinically. 4. Signs of RIGHT breast lumpectomy. Potential postoperative changes in the LEFT breast with small focal area of nodularity in the lateral LEFT breast which could also be postoperative, correlate clinically and consider mammographic correlation as warranted. 5. Mild cardiac enlargement.   05/18/2022 Treatment Plan Change   Anastrozole + Verzenio 100mg  PO BID   06/23/2022 - 07/07/2022 Radiation Therapy   06/23/2022 through 07/07/2022 Site Technique Total Dose (Gy) Dose per Fx (Gy) Completed Fx Beam Energies  Lumbar Spine: Spine_LS_Pelv 3D 30/30 3 10/10 15X       CHIEF COMPLIANT: Follow-up of metastatic breast cancer on Verzenio with letrozole  HISTORY OF PRESENT ILLNESS:  History of Present Illness   The patient, with a history of cancer currently on Verzenio, presents with worsening neuropathy in her feet causing significant discomfort. The neuropathy has been a longstanding issue but has recently worsened. The patient also reports a new lump in her breast, which is sometimes painful. She has an appointment with her primary care physician to evaluate this lump. The patient also  mentions feeling nauseous today, but this seems to be an isolated incident. The patient has a history of anemia, which has shown some improvement in recent lab results.         ALLERGIES:  has No Known Allergies.  MEDICATIONS:  Current Outpatient Medications  Medication Sig Dispense Refill   gabapentin (NEURONTIN) 300 MG capsule Take 1 capsule (300 mg total) by mouth 3 (three) times daily. 90 capsule 6   abemaciclib (VERZENIO) 50 MG tablet Take 1 tablet (50 mg total) by mouth 2 (two) times daily. 56 tablet 3   albuterol (VENTOLIN HFA) 108 (90 Base) MCG/ACT inhaler Inhale 2 puffs by mouth into the lungs every 6 hours as needed for wheezing or shortness of breath. 18 g 2   anastrozole (ARIMIDEX) 1 MG tablet Take 1 tablet (1 mg total) by mouth daily. 90 tablet 3   calcium-vitamin D (OSCAL WITH D) 500-5 MG-MCG tablet Take 1 tablet by mouth daily with supper. 90 tablet 2   escitalopram (LEXAPRO) 20 MG tablet Take 1 tablet (20 mg total) by mouth daily. 90 tablet 1   hydrochlorothiazide (HYDRODIURIL) 25 MG tablet Take 1 tablet (25 mg total) by mouth daily. HOLD until follow up with your primary doctor 30 tablet 0   montelukast (SINGULAIR)  10 MG tablet Take 1 tablet (10 mg total) by mouth at bedtime. 90 tablet 3   ondansetron (ZOFRAN) 4 MG tablet Take 1 tablet (4 mg total) by mouth every 8 (eight) hours. 90 tablet 11   oxyCODONE ER 9 MG C12A Take 1 capsule by mouth 2 (two) times daily. 60 capsule 0   oxyCODONE-acetaminophen (PERCOCET) 7.5-325 MG tablet Take 1 - 2 tablets by mouth every 6 (six) hours as needed for severe pain. 102 tablet 0   pantoprazole (PROTONIX) 40 MG tablet Take 1 tablet (40 mg total) by mouth daily. 30 tablet 3   polyethylene glycol (MIRALAX / GLYCOLAX) 17 g packet Take 17 g by mouth 2 (two) times daily. 60 each 3   prochlorperazine (COMPAZINE) 10 MG tablet Take 1 tablet (10 mg total) by mouth every 6 (six) hours as needed for nausea or vomiting. 30 tablet 0   propranolol  (INDERAL) 10 MG tablet Take 1 tablet (10 mg total) by mouth 2 (two) times daily. Please call [phone] to schedule an appointment for future refills. Thank you. 2nd attempt. 30 tablet 0   senna (SENOKOT) 8.6 MG TABS tablet Take 2 tablets (17.2 mg total) by mouth at bedtime. 60 tablet 3   No current facility-administered medications for this visit.    PHYSICAL EXAMINATION: ECOG PERFORMANCE STATUS: 1 - Symptomatic but completely ambulatory  Vitals:   05/26/23 1124  BP: (!) 161/80  Pulse: (!) 56  Resp: 18  Temp: 98.2 F (36.8 C)  SpO2: 100%   Filed Weights   05/26/23 1124  Weight: 204 lb 9.6 oz (92.8 kg)    Physical Exam   BREAST: Presence of scar tissue palpated.      (exam performed in the presence of a chaperone)  LABORATORY DATA:  I have reviewed the data as listed    Latest Ref Rng & Units 05/26/2023   11:07 AM 02/24/2023    1:56 PM 01/31/2023    2:26 PM  CMP  Glucose 70 - 99 mg/dL 94  91  865   BUN 6 - 20 mg/dL 17  13  15    Creatinine 0.44 - 1.00 mg/dL 7.84  6.96  2.95   Sodium 135 - 145 mmol/L 140  141  142   Potassium 3.5 - 5.1 mmol/L 3.9  3.8  3.7   Chloride 98 - 111 mmol/L 104  106  107   CO2 22 - 32 mmol/L 32  31  28   Calcium 8.9 - 10.3 mg/dL 9.4  8.7  9.0   Total Protein 6.5 - 8.1 g/dL 7.1  6.9  7.2   Total Bilirubin <1.2 mg/dL 0.3  0.4  0.4   Alkaline Phos 38 - 126 U/L 65  52  53   AST 15 - 41 U/L 17  18  19    ALT 0 - 44 U/L 17  17  21      Lab Results  Component Value Date   WBC 4.4 05/26/2023   HGB 10.2 (L) 05/26/2023   HCT 32.7 (L) 05/26/2023   MCV 77.9 (L) 05/26/2023   PLT 217 05/26/2023   NEUTROABS 2.8 05/26/2023    ASSESSMENT & PLAN:  Malignant neoplasm of upper-outer quadrant of right breast in female, estrogen receptor positive (HCC) 12/03/2020: Bilateral breast biopsies: Right breast: T2N1 stage IIa grade 2 ILC, ER/PR positive, HER2 negative, Ki-67 40%, right axillary lymph node positive with extracapsular extension MammaPrint: High  risk   01/27/2021 -06/08/2021 neoadjuvant chemotherapy with dose dense Adriamycin and  Cytoxan x4 followed by Taxol x11   07/09/2021:Right lumpectomy: Grade 2 invasive lobular cancer, 2.5 cm, LCIS, margins negative, LCIS focally at anterior margin, 1/1 lymph node positive, ER 95%, PR 100%, HER2 negative, Ki-67 40% 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  ----------------------------------------------------------------------------------------------------------------------------- Current treatment: Verzinio (started 05/21/2022) with anastrozole Verzinio dose: 50 p.o. twice daily Toxicities: Nausea: Taking premedications   06/02/2022: PET CT scan: No FDG activity in the multifocal sclerotic and mixed lytic and sclerotic lesions involving the spine, ribs, sacrum, pelvic bones and bilateral proximal femurs. 11/21/2022: CT CAP: Bone mets unchanged, 5 mm right lobe of the liver: Stable sigmoid colon: Mild diverticulitis 11/21/2022: Bone scan: Mild activity at L3 vertebral body compression fracture, stable 12/15/2022: MRI lumbar spine: Stable pathologic fracture L3 stable other bone mets   Continue with the current treatment I recommend obtaining PET/CT for restaging and follow-up after that with a telephone visit. Severe peripheral neuropathy: I sent prescription for gabapentin.  She was started 300 mg at bedtime and then increase it to 3 times a day as needed. Back pain: Takes pain medication.    Physical Therapy Request for additional sessions for lymphedema. -Send request for additional physical therapy sessions.  General Health Maintenance -Continue Verzenio twice daily. -Return in three months for follow-up, with a phone call after PET scan to discuss results.          Orders Placed  This Encounter  Procedures   NM PET Image Restag (PS) Skull Base To Thigh    Standing Status:   Future    Standing Expiration Date:   05/25/2024    Order Specific Question:   If indicated for the ordered procedure, I authorize the administration of a radiopharmaceutical per Radiology protocol    Answer:   Yes    Order Specific Question:   Is the patient pregnant?    Answer:   No    Order Specific Question:   Preferred imaging location?    Answer:   [facility]    Order Specific Question:   Release to patient    Answer:   Immediate   Ambulatory referral to Physical Therapy    Referral Priority:   Routine    Referral Type:   Physical Medicine    Referral Reason:   Specialty Services Required    Requested Specialty:   Physical Therapy    Number of Visits Requested:   1   The patient has a good understanding of the overall plan. she agrees with it. she will call with any problems that may develop before the next visit here. Total time spent: 30 mins including face to face time and time spent for planning, charting and co-ordination of care   Tamsen Meek, MD 05/26/23

## 2023-05-26 NOTE — Progress Notes (Signed)
[facility]  Chelsea Hansen, is a 52 y.o. female  ZOX:[id number]  WJX:[id number]  DOB - 01-30-1971  Chief Complaint  Patient presents with   Chest Pain    Started 3 weeks ago Right side  Sometimes pain is constant and sometimes the pain comes and goes        Subjective:   Chelsea Hansen is a 52 y.o. female here today for a follow up visit. Patient has No headache, No chest pain, No abdominal pain - No Nausea, No new weakness tingling or numbness, No Cough - shortness of breath Chest Pain  This is a new problem. The current episode started 1 to 4 weeks ago. The onset quality is sudden. The problem occurs constantly. The problem has been resolved (stopped 3 days ago). The pain is present in the lateral region (right side). Pain scale: when present 10/10. The pain is severe. The quality of the pain is described as burning, heavy, numbness, pressure, sharp, squeezing, stabbing and tightness. The pain does not radiate. Associated symptoms include headaches. Associated symptoms comments: Stage 4 Bone cancer . The pain is aggravated by movement, emotional upset, lifting arm and lifting. Treatments tried: Percocet. The treatment provided moderate relief. Risk factors include post-menopausal and stress.  Her past medical history is significant for cancer. Prior workup: none.  Pain in chest is worst when neuropathy bilateral feet. On tx for cancer.   No problems updated.  Comprehensive ROS Pertinent positive and negative noted in HPI   No Known Allergies  Past Medical History:  Diagnosis Date   Allergy    seasonal allergies   Anxiety    on meds   Asthma    uses inhaler   Breast cancer (HCC) 2012   RIGHT lumpectomy   Cancer (HCC) 2022   RIGHT breast lump-dx 2022   Depression    on meds   DVT (deep venous thrombosis) (HCC) 2010   after hysterectomy   Family history of uterine cancer    GERD (gastroesophageal reflux disease)    with certain foods/OTC PRN meds    Headache(784.0)    History of radiation therapy    Bilateral breast- 09/03/21-11/02/21- Dr. Antony Blackbird   Hyperlipidemia    on meds   Hypertension    on meds   SVT (supraventricular tachycardia) (HCC)     Current Outpatient Medications on File Prior to Visit  Medication Sig Dispense Refill   abemaciclib (VERZENIO) 50 MG tablet Take 1 tablet (50 mg total) by mouth 2 (two) times daily. 56 tablet 3   albuterol (VENTOLIN HFA) 108 (90 Base) MCG/ACT inhaler Inhale 2 puffs by mouth into the lungs every 6 hours as needed for wheezing or shortness of breath. 18 g 2   anastrozole (ARIMIDEX) 1 MG tablet Take 1 tablet (1 mg total) by mouth daily. 90 tablet 3   calcium-vitamin D (OSCAL WITH D) 500-5 MG-MCG tablet Take 1 tablet by mouth daily with supper. 90 tablet 2   gabapentin (NEURONTIN) 300 MG capsule Take 1 capsule (300 mg total) by mouth 3 (three) times daily. 90 capsule 6   montelukast (SINGULAIR) 10 MG tablet Take 1 tablet (10 mg total) by mouth at bedtime. 90 tablet 3   ondansetron (ZOFRAN) 4 MG tablet Take 1 tablet (4 mg total) by mouth every 8 (eight) hours. 90 tablet 11   oxyCODONE ER 9 MG C12A Take 1 capsule by mouth 2 (two) times daily. 60 capsule 0   oxyCODONE-acetaminophen (PERCOCET) 7.5-325 MG tablet Take 1 -  2 tablets by mouth every 6 (six) hours as needed for severe pain. 102 tablet 0   pantoprazole (PROTONIX) 40 MG tablet Take 1 tablet (40 mg total) by mouth daily. 30 tablet 3   polyethylene glycol (MIRALAX / GLYCOLAX) 17 g packet Take 17 g by mouth 2 (two) times daily. 60 each 3   prochlorperazine (COMPAZINE) 10 MG tablet Take 1 tablet (10 mg total) by mouth every 6 (six) hours as needed for nausea or vomiting. 30 tablet 0   propranolol (INDERAL) 10 MG tablet Take 1 tablet (10 mg total) by mouth 2 (two) times daily. Please call [phone] to schedule an appointment for future refills. Thank you. 2nd attempt. 30 tablet 0   senna (SENOKOT) 8.6 MG TABS tablet Take 2 tablets (17.2 mg  total) by mouth at bedtime. 60 tablet 3   No current facility-administered medications on file prior to visit.   Health Maintenance  Topic Date Due   Hepatitis C Screening  Never done   DTaP/Tdap/Td vaccine (1 - Tdap) Never done   Zoster (Shingles) Vaccine (1 of 2) Never done   COVID-19 Vaccine (3 - Pfizer risk series) 12/02/2020   Flu Shot  Never done   Mammogram  03/14/2025   Colon Cancer Screening  11/07/2025   Pap with HPV screening  09/15/2027   HIV Screening  Completed   HPV Vaccine  Aged Out    Objective:   Vitals:   05/26/23 1428  BP: (!) 154/85  Pulse: (!) 48  Resp: 16  SpO2: 100%  Weight: 205 lb 6.4 oz (93.2 kg)   BP Readings from Last 3 Encounters:  05/26/23 (!) 154/85  05/26/23 (!) 161/80  03/18/23 (!) 155/98      Physical Exam Vitals reviewed.  HENT:     Head: Normocephalic.     Right Ear: Tympanic membrane, ear canal and external ear normal.     Left Ear: Tympanic membrane, ear canal and external ear normal.     Nose: Nose normal.     Mouth/Throat:     Mouth: Mucous membranes are moist.  Eyes:     Extraocular Movements: Extraocular movements intact.     Pupils: Pupils are equal, round, and reactive to light.  Cardiovascular:     Rate and Rhythm: Normal rate.  Pulmonary:     Effort: Pulmonary effort is normal.     Breath sounds: Normal breath sounds.  Abdominal:     General: Bowel sounds are normal.     Palpations: Abdomen is soft.  Musculoskeletal:        General: Normal range of motion.     Cervical back: Normal range of motion.  Skin:    General: Skin is warm and dry.  Neurological:     Mental Status: She is alert and oriented to person, place, and time.  Psychiatric:        Mood and Affect: Mood normal.     Assessment & Plan  Chelsea Hansen was seen today for chest pain.  Diagnoses and all orders for this visit:  Hypertension, unspecified type BP goal - < 130/80 Explained that having normal blood pressure is the goal and medications  are helping to get to goal and maintain normal blood pressure. DIET: Limit salt intake, read nutrition labels to check salt content, limit fried and high fatty foods  Avoid using multisymptom OTC cold preparations that generally contain sudafed which can rise BP. Consult with pharmacist on best cold relief products to use for persons with HTN  EXERCISE Discussed incorporating exercise such as walking - 30 minutes most days of the week and can do in 10 minute intervals    -     amLODipine (NORVASC) 5 MG tablet; Take 1 tablet (5 mg total) by mouth daily.     Patient have been counseled extensively about nutrition and exercise. Other issues discussed during this visit include: low cholesterol diet, weight control and daily exercise, foot care, annual eye examinations at Ophthalmology, importance of adherence with medications and regular follow-up. We also discussed long term complications of uncontrolled diabetes and hypertension.   Return in about 3 weeks (around 06/16/2023).  The patient was given clear instructions to go to ER or return to medical center if symptoms don't improve, worsen or new problems develop. The patient verbalized understanding. The patient was told to call to get lab results if they haven't heard anything in the next week.   This note has been created with Education officer, environmental. Any transcriptional errors are unintentional.   Grayce Sessions, NP 05/30/2023, 11:49 AM

## 2023-05-26 NOTE — Assessment & Plan Note (Signed)
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  ----------------------------------------------------------------------------------------------------------------------------- Current treatment: Verzinio (started 05/21/2022) with anastrozole Verzinio dose: 50 p.o. twice daily Toxicities: Nausea: Taking premedications   06/02/2022: PET CT scan: No FDG activity in the multifocal sclerotic and mixed lytic and sclerotic lesions involving the spine, ribs, sacrum, pelvic bones and bilateral proximal femurs. 11/21/2022: CT CAP: Bone mets unchanged, 5 mm right lobe of the liver: Stable sigmoid colon: Mild diverticulitis 11/21/2022: Bone scan: Mild activity at L3 vertebral body compression fracture, stable 12/15/2022: MRI lumbar spine: Stable pathologic fracture L3 stable other bone mets   Continue with the current treatment I recommend obtaining CT CAP for restaging and follow-up after that.

## 2023-05-27 ENCOUNTER — Encounter: Payer: Self-pay | Admitting: Hematology and Oncology

## 2023-05-27 ENCOUNTER — Other Ambulatory Visit (HOSPITAL_COMMUNITY): Payer: Self-pay

## 2023-05-30 ENCOUNTER — Encounter (INDEPENDENT_AMBULATORY_CARE_PROVIDER_SITE_OTHER): Payer: Self-pay | Admitting: Primary Care

## 2023-05-31 ENCOUNTER — Other Ambulatory Visit (HOSPITAL_COMMUNITY): Payer: Self-pay

## 2023-05-31 ENCOUNTER — Other Ambulatory Visit: Payer: Self-pay | Admitting: Cardiology

## 2023-05-31 ENCOUNTER — Telehealth: Payer: Self-pay | Admitting: *Deleted

## 2023-05-31 ENCOUNTER — Other Ambulatory Visit: Payer: Self-pay | Admitting: Nurse Practitioner

## 2023-05-31 DIAGNOSIS — G893 Neoplasm related pain (acute) (chronic): Secondary | ICD-10-CM

## 2023-05-31 DIAGNOSIS — C50411 Malignant neoplasm of upper-outer quadrant of right female breast: Secondary | ICD-10-CM

## 2023-05-31 DIAGNOSIS — C7951 Secondary malignant neoplasm of bone: Secondary | ICD-10-CM

## 2023-05-31 DIAGNOSIS — Z515 Encounter for palliative care: Secondary | ICD-10-CM

## 2023-05-31 MED ORDER — OXYCODONE-ACETAMINOPHEN 7.5-325 MG PO TABS
1.0000 | ORAL_TABLET | Freq: Four times a day (QID) | ORAL | 0 refills | Status: DC | PRN
  Filled 2023-05-31: qty 102, 13d supply, fill #0

## 2023-05-31 NOTE — Progress Notes (Signed)
  Care Coordination   Note   05/31/2023 Name: BROGAN THORBURN MRN: [id number] DOB: July 03, 1970  NAYSA DONIS is a 52 y.o. year old female who sees Grayce Sessions, NP for primary care. I reached out to [name] by phone today to offer care coordination services.  Ms. Shrode was given information about Care Coordination services today including:   The Care Coordination services include support from the care team which includes your Nurse Coordinator, Clinical Social Worker, or Pharmacist.  The Care Coordination team is here to help remove barriers to the health concerns and goals most important to you. Care Coordination services are voluntary, and the patient may decline or stop services at any time by request to their care team member.   Care Coordination Consent Status: Patient agreed to services and verbal consent obtained.   Follow up plan:  Telephone appointment with care coordination team member scheduled for:  12/11  Encounter Outcome:  Patient Scheduled  [facility] Coordination Care Guide  Direct Dial: [phone]

## 2023-05-31 NOTE — Therapy (Signed)
OUTPATIENT PHYSICAL THERAPY  UPPER EXTREMITY ONCOLOGY EVALUATION  Patient Name: Chelsea Hansen MRN: [id number] DOB:1971-05-25, 52 y.o., female Today's Date: 06/01/2023  END OF SESSION:  PT End of Session - 06/01/23 1405     Visit Number 1    Number of Visits 12    Date for PT Re-Evaluation 07/13/23    PT Start Time 1403    PT Stop Time 1448    PT Time Calculation (min) 45 min    Activity Tolerance Patient tolerated treatment well;Treatment limited secondary to medical complications (Comment)             Past Medical History:  Diagnosis Date   Allergy    seasonal allergies   Anxiety    on meds   Asthma    uses inhaler   Breast cancer (HCC) 2012   RIGHT lumpectomy   Cancer (HCC) 2022   RIGHT breast lump-dx 2022   Depression    on meds   DVT (deep venous thrombosis) (HCC) 2010   after hysterectomy   Family history of uterine cancer    GERD (gastroesophageal reflux disease)    with certain foods/OTC PRN meds   Headache(784.0)    History of radiation therapy    Bilateral breast- 09/03/21-11/02/21- Dr. Antony Blackbird   Hyperlipidemia    on meds   Hypertension    on meds   SVT (supraventricular tachycardia) (HCC)    Past Surgical History:  Procedure Laterality Date   ABDOMINAL HYSTERECTOMY     AXILLARY SENTINEL NODE BIOPSY Left 07/09/2021   Procedure: LEFT AXILLARY SENTINEL NODE BIOPSY;  Surgeon: Abigail Miyamoto, MD;  Location: [facility];  Service: General;  Laterality: Left;   BIOPSY  06/29/2022   Procedure: BIOPSY;  Surgeon: Sherrilyn Rist, MD;  Location: [facility];  Service: Gastroenterology;;   BREAST EXCISIONAL BIOPSY Right 09/16/2009   BREAST LUMPECTOMY WITH RADIOACTIVE SEED LOCALIZATION Bilateral 07/09/2021   Procedure: BILATERAL BREAST LUMPECTOMY WITH RADIOACTIVE SEED LOCALIZATION;  Surgeon: Abigail Miyamoto, MD;  Location: [facility];  Service: General;  Laterality: Bilateral;   BREAST SURGERY     lumpectomy    ESOPHAGOGASTRODUODENOSCOPY (EGD) WITH PROPOFOL N/A 06/29/2022   Procedure: ESOPHAGOGASTRODUODENOSCOPY (EGD) WITH PROPOFOL;  Surgeon: Sherrilyn Rist, MD;  Location: [facility];  Service: Gastroenterology;  Laterality: N/A;   PORT-A-CATH REMOVAL Left 07/09/2021   Procedure: REMOVAL PORT-A-CATH;  Surgeon: Abigail Miyamoto, MD;  Location: [facility];  Service: General;  Laterality: Left;   PORTACATH PLACEMENT Left 01/26/2021   Procedure: INSERTION PORT-A-CATH;  Surgeon: Abigail Miyamoto, MD;  Location: [facility];  Service: General;  Laterality: Left;   RADIOACTIVE SEED GUIDED AXILLARY SENTINEL LYMPH NODE Right 07/09/2021   Procedure: RADIOACTIVE SEED GUIDED RIGHT AXILLARY SENTINEL LYMPH NODE DISSECTION;  Surgeon: Abigail Miyamoto, MD;  Location: [facility];  Service: General;  Laterality: Right;   TUBAL LIGATION     Patient Active Problem List   Diagnosis Date Noted   Neoplasm related pain 12/07/2022   Chronic constipation 06/30/2022   Nausea and vomiting in adult 06/29/2022   Hematemesis 06/28/2022   Metastasis to bone (HCC) 05/20/2022   PSVT (paroxysmal supraventricular tachycardia) (HCC) 07/08/2021   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   GERD (gastroesophageal reflux disease) 05/27/2021   Hyperlipidemia 05/27/2021   Hypertension 05/27/2021   Pain management contract signed 05/04/2021   Chronic pain syndrome (breast cancer) 05/04/2021   Palpitations 03/25/2021   Sinus bradycardia 03/25/2021   Daytime somnolence 03/25/2021  Snoring 03/25/2021   Obesity (BMI 30-39.9) 03/25/2021   Port-A-Cath in place 01/27/2021   Genetic testing 12/24/2020   Family history of uterine cancer 12/17/2020   Malignant neoplasm of upper-outer quadrant of right breast in female, estrogen receptor positive (HCC) 12/12/2020   Malignant neoplasm of upper-outer quadrant of left breast in female, estrogen receptor positive (HCC) 12/12/2020   Hypokalemia 01/02/2012   Asthma  01/01/2012   Bradycardia 01/01/2012   Syncope 12/31/2011   Mediastinal adenopathy 12/31/2011   Anemia 12/31/2011   Chest pain 12/31/2011       REFERRING PROVIDER: Serena Croissant, MD  REFERRING DIAG: Lymphedema  THERAPY DIAG:  Malignant neoplasm of upper-outer quadrant of right breast in female, estrogen receptor positive (HCC)  Lymphedema, not elsewhere classified  Abnormal posture  Malignant neoplasm of upper-outer quadrant of left breast in female, estrogen receptor positive (HCC)  ONSET DATE: 1 month ago  Rationale for Evaluation and Treatment: Rehabilitation  SUBJECTIVE:                                                                                                                                                                                           SUBJECTIVE STATEMENT:  Bilateral axillary regions started swelling again,  and right breast greater than left breast, and both of my chest muscles  are hurting again. I started back to wearing my sports bra. I have been using my Flexi touch every day. For a little while the swelling feels better, but it always comes back. I need a new compression bra. I have been working a lot, and I took myself off of night shift. I feel like work contributed to my swelling.  PERTINENT HISTORY:  Neoadjuvant chemotherapy with Doxorubicin and Cyclophosphamide given x 4 beginning 01/27/2021 and completing on 03/10/2021 followed by weekly paclitaxel x 12 beginning 05/2021 07/09/2021: Right lumpectomy: Grade 2 invasive lobular cancer, 2.5 cm, LCIS, margins negative, LCIS focally at anterior margin, 1/1 lymph node positive, ER 95%, PR 100%, HER2 negative, Ki-67 40% Left lumpectomy: High-grade DCIS: 0.7 cm, margins negative, 0/1 lymph node negative ER 95%, PR 100%. Pt now  STAGE IV Breast Cancer with bone mets throughout thoracic and lumbar regions and is s/p radiation to her lumbar spine. Pt. sees Palliative Care for chronic pain management.    PAIN:   Are you having pain? Yes NPRS scale: 7/10 right greater than left Pain location: Bilateral breast/chest Pain orientation: Bilateral  PAIN TYPE: aching, sharp, and tight Pain description: intermittent  Aggravating factors: a lot of lifting/work activities,grocery shopping Relieving factors: Rest, Flexi touch,sports bra  PRECAUTIONS: Stage IV breast Cancer with bone METS, bilateral Breast lymphedema  RED FLAGS: Compression fracture: Yes: last year from bone METS    WEIGHT BEARING RESTRICTIONS: No  FALLS:  Has patient fallen in last 6 months? No  LIVING ENVIRONMENT: Lives with: lives with their family and lives alone Lives in: Other hotel Stairs: Yes; Internal: 15 steps; on left going up Has following equipment at home: NA  OCCUPATION: shift mgr for [employer]  LEISURE: shop, dominoes  HAND DOMINANCE: right   PRIOR LEVEL OF FUNCTION: Independent  PATIENT GOALS: Reduce swelling, reduce pain   OBJECTIVE: Note: Objective measures were completed at Evaluation unless otherwise noted.  COGNITION: Overall cognitive status: Within functional limits for tasks assessed   PALPATION: Very tender bilateral UT, pectorals, axilla and lateral trunk, medial and lateral breasts, Right medial upper arm  OBSERVATIONS / OTHER ASSESSMENTS: Slight cording noted in right axilla to medial upper arm, very sensitive to light touch,Fibrosis noted bilateral breasts medial and lateral, Right axilla with increased swelling, mild peau d'orange right inferior breast with some enlarged pores bilaterally  SENSATION: Light touch: Deficits     POSTURE: Forward head, rounded shoulders  UPPER EXTREMITY AROM/PROM:  A/PROM RIGHT   eval   Shoulder extension 40, tight axilla  Shoulder flexion 140 +axilla  Shoulder abduction 147,+axilla  Shoulder internal rotation 35 +  Shoulder external rotation 85+    (Blank rows = not tested)  A/PROM LEFT   eval  Shoulder extension 47  Shoulder flexion 137  axilla  Shoulder abduction 146, axilla  Shoulder internal rotation Pain shoulder;held  Shoulder external rotation     (Blank rows = not tested)  CERVICAL AROM: All within functional limits:      UPPER EXTREMITY STRENGTH:   LYMPHEDEMA ASSESSMENTS:   SURGERY TYPE/DATE: 07/09/2021 BILATERAL BREAST LUMPECTOMY WITH RADIOACTIVE SEED LOCALIZATION RADIOACTIVE SEED GUIDED RIGHT AXILLARY TARGETED LYMPH NODE DISSECTION LEFT AXILLARY SENTINEL NODE BIOPSY LEFT AXILLARY LYMPH NODE MAPPING WITH SENTIMAG  NUMBER OF LYMPH NODES REMOVED: Right 1/1, Left 0/1   CHEMOTHERAPY: Neoadjuvant  RADIATION:Bilateral Breast and lumbar spine  HORMONE TREATMENT: YES  INFECTIONS: NO   LYMPHEDEMA ASSESSMENTS:   LANDMARK RIGHT  eval  At axilla    15 cm proximal to olecranon process   10 cm proximal to olecranon process   Olecranon process   15 cm proximal to ulnar styloid process   10 cm proximal to ulnar styloid process   Just proximal to ulnar styloid process   Across hand at thumb web space   At base of 2nd digit   (Blank rows = not tested)  LANDMARK LEFT  eval  At axilla    15 cm proximal to olecranon process   10 cm proximal to olecranon process   Olecranon process   15 cm proximal to ulnar styloid process   10 cm proximal to ulnar styloid process   Just proximal to ulnar styloid process   Across hand at thumb web space   At base of 2nd digit   (Blank rows = not tested)   FUNCTIONAL TESTS:    GAIT: Distance walked: front office to treatment room 8 Assistive device utilized: None Level of assistance: Complete Independence Comments: WNL   QUICK DASH SURVEY: 55%  BREAST COMPLAINTS QUESTIONNAIRE Pain:7 Heaviness:7 Swollen feeling:10 Tense Skin:10 Redness:8 Bra Print:10 Size of Pores:10 Hard feeling: 10 Total:   72  /80 A Score over 9 indicates lymphedema issues in the breast    TODAY'S TREATMENT:  DATE:  06/01/2023 Discussed treatment plan and interventions with pt including STM to decrease muscular tightness, stretching exercises for chest/shoulder to decrease muscle tightness, importance of proper compression bra (Gave pt new script), and return to MLD, CDT. Pt reviewed stargazer stretch and was reminded to start back to Abduction wall slides. Pt is not wearing a proper sports bra or compression bra but will start back to one.    PATIENT EDUCATION:  Education details: Pt reminded about importance of compression, MLD and Flexitouch use and its importance to decrease breast swelling, gentle stretching of pecs and shoulder abduction Person educated: Patient Education method: Explanation Education comprehension: verbalized understanding and returned demonstration  HOME EXERCISE PROGRAM: Resume stargazer and wall slides for abd 2x/day 5 repetitions  ASSESSMENT:  CLINICAL IMPRESSION: Patient is a 52 y.o. female who was seen today for physical therapy evaluation and treatment for complaints of Bilateral Breast  and chest pain and lymphedema.which has progressed over the last month despite using her Flexi touch. It was emphasized that pt must get into the proper compression bra to assist with swelling. There is significant tenderness to palpation throughout bilateral UQ, with noted edema right greater than left breast with fibrosis and enlarged pores bilaterally. Pt was given a script to try and get new compression bras. Importance of doing gentle stretching was also emphasized for the muscular pain she is having bilaterally. She has had to cut her hours at work recently due to pain and swelling. She will benefit from skilled PT to address deficits and return to PLOF.   OBJECTIVE IMPAIRMENTS: decreased activity tolerance, decreased ROM, increased edema, impaired flexibility, impaired UE functional use, postural dysfunction,  and pain.   ACTIVITY LIMITATIONS: carrying, lifting, sleeping, and reach over head  PARTICIPATION LIMITATIONS: cleaning and occupation  PERSONAL FACTORS: 3+ comorbidities: Bilateral breast cancer s/p surgery, chemo and radiation, now Stage IV Metastatic Breast Cancer with multiple bone METS in thoraacic and lumbar spine  are also affecting patient's functional outcome.   REHAB POTENTIAL: Good  CLINICAL DECISION MAKING: Stable/uncomplicated  EVALUATION COMPLEXITY: Low  GOALS: Goals reviewed with patient? Yes  SHORT TERM GOALS=LONG TERM GOALS: Target date: 07/12/2022  Pt will be independent in self MLD to decrease breast swelling Baseline: Goal status: INITIAL  2.  Pt will have breast complaints survey no greater than 20% to demonstrate improved swelling and decreased pain Baseline:  Goal status: INITIAL  3.  Pt will report breast swelling improved by atleast 35-50% Baseline:  Goal status: INITIAL  4.  Pt will improve bilateral shoulder AROM to 150 degrees for improved reaching Baseline:  Goal status: INITIAL  5.  Pt will improve bilateral shoulder abduction to 167 degrees for improved ability to work overhead Baseline:  Goal status: INITIAL  6.  Quick dash will improve to no greater than 20% to demonstrate improved function Baseline:  Goal status: INITIAL  7.  Pt will be compliant with appropriate compression bra to decrease swelling  Goal Status:INITIAL   PLAN:  PT FREQUENCY: 2x/week  PT DURATION: 6 weeks  PLANNED INTERVENTIONS: therapeutic Exercises, Manual Therapy, Self Care, Dry Needling  PLAN FOR NEXT SESSION: Did pt get compression bra? Measure arm circumferences,STM bilateral UT, Pectorals, lateral trunk, MFR right UE cording, AAROM exs to improve ROM,, MLD to right greater than left breast and instruct pt.  Waynette Buttery, PT 06/01/2023, 2:52 PM

## 2023-06-01 ENCOUNTER — Other Ambulatory Visit: Payer: Self-pay

## 2023-06-01 ENCOUNTER — Ambulatory Visit: Payer: Self-pay | Admitting: Licensed Clinical Social Worker

## 2023-06-01 ENCOUNTER — Ambulatory Visit: Payer: 59 | Attending: Hematology and Oncology

## 2023-06-01 ENCOUNTER — Other Ambulatory Visit (HOSPITAL_COMMUNITY): Payer: Self-pay

## 2023-06-01 DIAGNOSIS — M25611 Stiffness of right shoulder, not elsewhere classified: Secondary | ICD-10-CM | POA: Insufficient documentation

## 2023-06-01 DIAGNOSIS — I972 Postmastectomy lymphedema syndrome: Secondary | ICD-10-CM | POA: Insufficient documentation

## 2023-06-01 DIAGNOSIS — C50412 Malignant neoplasm of upper-outer quadrant of left female breast: Secondary | ICD-10-CM | POA: Insufficient documentation

## 2023-06-01 DIAGNOSIS — C50411 Malignant neoplasm of upper-outer quadrant of right female breast: Secondary | ICD-10-CM | POA: Diagnosis not present

## 2023-06-01 DIAGNOSIS — Z170 Estrogen receptor positive status [ER+]: Secondary | ICD-10-CM | POA: Insufficient documentation

## 2023-06-01 DIAGNOSIS — R293 Abnormal posture: Secondary | ICD-10-CM | POA: Diagnosis not present

## 2023-06-01 DIAGNOSIS — M25612 Stiffness of left shoulder, not elsewhere classified: Secondary | ICD-10-CM | POA: Insufficient documentation

## 2023-06-01 DIAGNOSIS — I890 Lymphedema, not elsewhere classified: Secondary | ICD-10-CM

## 2023-06-01 MED ORDER — PROPRANOLOL HCL 10 MG PO TABS
10.0000 mg | ORAL_TABLET | Freq: Two times a day (BID) | ORAL | 0 refills | Status: DC
  Filled 2023-06-01 – 2023-06-29 (×3): qty 30, 15d supply, fill #0

## 2023-06-01 NOTE — Patient Outreach (Signed)
  Care Coordination   06/01/2023 Name: Chelsea Hansen MRN: [id number] DOB: 04-13-1971   Care Coordination Outreach Attempts:  An unsuccessful telephone outreach was attempted today to offer the patient information about available care coordination services.  Follow Up Plan:  Additional outreach attempts will be made to offer the patient care coordination information and services.   Encounter Outcome:  No Answer   Care Coordination Interventions:  No, not indicated    Kelton Pillar.Raymie Giammarco MSW, LCSW Licensed Visual merchandiser [facility] Care Management [phone]

## 2023-06-06 ENCOUNTER — Ambulatory Visit: Payer: Self-pay | Admitting: Licensed Clinical Social Worker

## 2023-06-06 NOTE — Patient Outreach (Signed)
  Care Coordination   Initial Visit Note   06/06/2023 Name: Chelsea Hansen MRN: [id number] DOB: 1971-04-18  Chelsea Hansen is a 52 y.o. year old female who sees Grayce Sessions, NP for primary care. I spoke with  Chelsea Hansen by phone today.  What matters to the patients health and wellness today? patient said she receives treatments for cancer at [facility]    Goals Addressed             This Visit's Progress    patient said she receives treatments for cancer at [facility]       Interventions:   Spoke with client via phone today about her needs Client was informed of program support with RN, LCSW, Pharmacist Client said she receives treatments for cancer at [facility] Discussed pain issues of client Discussed vision of client. She said her vision was blurry occasionally Discussed family support. Client said she had good family support and that this support was very helpful Reviewed transport needs. Chelsea Hansen said she was driving well. She has no transport needs at present Reviewed upcoming client appointments. She said she was scheduled to have an MRI this Friday. Discussed physical therapy support. She said she participates in physical therapy sessions weekly as scheduled. She said these sessions are working on strengthening her muscles and working on arm strength and leg strength She said she has had support at [facility] and is pleased with care she receives at [facility].  Encouraged [name] to call LCSW as needed for SW support at [phone] Rosalita Chessman for phone call with LCSW today Chelsea Hansen was appreciative of call from LCSW today        SDOH assessments and interventions completed:  Yes  SDOH Interventions Today    Flowsheet Row Most Recent Value  SDOH Interventions   Depression Interventions/Treatment  Counseling  Stress Interventions Other (Comment)  [client has stress in managing  medical needs]        Care Coordination Interventions:  Yes, provided   Interventions Today    Flowsheet Row Most Recent Value  Chronic Disease   Chronic disease during today's visit Other  [spoke with client about client needs]  General Interventions   General Interventions Discussed/Reviewed General Interventions Discussed, Community Resources  Education Interventions   Education Provided Provided Education  Provided Verbal Education On [employer]  Mental Health Interventions   Mental Health Discussed/Reviewed Coping Strategies  [client is trying to manage stress and anxiety issues]  Nutrition Interventions   Nutrition Discussed/Reviewed Nutrition Discussed  Pharmacy Interventions   Pharmacy Dicussed/Reviewed Pharmacy Topics Discussed        Follow up plan: Follow up call scheduled for 08/01/23 at 3:00 PM     Encounter Outcome:  Patient Visit Completed   Kelton Pillar.Canisha Issac MSW, LCSW Licensed Visual merchandiser [facility] Care Management [phone]

## 2023-06-06 NOTE — Patient Instructions (Signed)
Visit Information  Thank you for taking time to visit with me today. Please don't hesitate to contact me if I can be of assistance to you.   Following are the goals we discussed today:   Goals Addressed             This Visit's Progress    patient said she receives treatments for cancer at [facility]       Interventions:   Spoke with client via phone today about her needs Client was informed of program support with RN, LCSW, Pharmacist Client said she receives treatments for cancer at [facility] Discussed pain issues of client Discussed vision of client. She said her vision was blurry occasionally Discussed family support. Client said she had good family support and that this support was very helpful Reviewed transport needs. Yoali said she was driving well. She has no transport needs at present Reviewed upcoming client appointments. She said she was scheduled to have an MRI this Friday. Discussed physical therapy support. She said she participates in physical therapy sessions weekly as scheduled. She said these sessions are working on strengthening her muscles and working on arm strength and leg strength She said she has had support at [facility] and is pleased with care she receives at [facility].  Encouraged Christalyn to call LCSW as needed for SW support at [phone] Rosalita Chessman for phone call with LCSW today Dameisha was appreciative of call from LCSW today        Our next appointment is by telephone on 08/01/23 at 3:00 PM   Please call the care guide team at [phone] if you need to cancel or reschedule your appointment.   If you are experiencing a Mental Health or Behavioral Health Crisis or need someone to talk to, please go to [facility] [address], [place] [phone])   The patient verbalized understanding of instructions, educational materials, and care  plan provided today and DECLINED offer to receive copy of patient instructions, educational materials, and care plan.   The patient has been provided with contact information for the care management team and has been advised to call with any health related questions or concerns.   Kelton Pillar.Lannette Avellino MSW, LCSW Licensed Visual merchandiser [facility] Care Management [phone]

## 2023-06-07 ENCOUNTER — Ambulatory Visit: Payer: 59

## 2023-06-07 DIAGNOSIS — C50412 Malignant neoplasm of upper-outer quadrant of left female breast: Secondary | ICD-10-CM

## 2023-06-07 DIAGNOSIS — C50411 Malignant neoplasm of upper-outer quadrant of right female breast: Secondary | ICD-10-CM | POA: Diagnosis not present

## 2023-06-07 DIAGNOSIS — I972 Postmastectomy lymphedema syndrome: Secondary | ICD-10-CM | POA: Diagnosis not present

## 2023-06-07 DIAGNOSIS — M25612 Stiffness of left shoulder, not elsewhere classified: Secondary | ICD-10-CM | POA: Diagnosis not present

## 2023-06-07 DIAGNOSIS — I890 Lymphedema, not elsewhere classified: Secondary | ICD-10-CM

## 2023-06-07 DIAGNOSIS — R293 Abnormal posture: Secondary | ICD-10-CM

## 2023-06-07 DIAGNOSIS — Z170 Estrogen receptor positive status [ER+]: Secondary | ICD-10-CM | POA: Diagnosis not present

## 2023-06-07 DIAGNOSIS — M25611 Stiffness of right shoulder, not elsewhere classified: Secondary | ICD-10-CM | POA: Diagnosis not present

## 2023-06-07 NOTE — Therapy (Signed)
OUTPATIENT PHYSICAL THERAPY  UPPER EXTREMITY ONCOLOGY EVALUATION  Patient Name: Chelsea Hansen MRN: [id number] DOB:July 23, 1970, 52 y.o., female Today's Date: 06/07/2023  END OF SESSION:  PT End of Session - 06/07/23 1401     Visit Number 2    Number of Visits 12    Date for PT Re-Evaluation 07/13/23    PT Start Time 1402    PT Stop Time 1452    PT Time Calculation (min) 50 min    Activity Tolerance Patient tolerated treatment well    Behavior During Therapy [facility] for tasks assessed/performed             Past Medical History:  Diagnosis Date   Allergy    seasonal allergies   Anxiety    on meds   Asthma    uses inhaler   Breast cancer (HCC) 2012   RIGHT lumpectomy   Cancer (HCC) 2022   RIGHT breast lump-dx 2022   Depression    on meds   DVT (deep venous thrombosis) (HCC) 2010   after hysterectomy   Family history of uterine cancer    GERD (gastroesophageal reflux disease)    with certain foods/OTC PRN meds   Headache(784.0)    History of radiation therapy    Bilateral breast- 09/03/21-11/02/21- Dr. Antony Blackbird   Hyperlipidemia    on meds   Hypertension    on meds   SVT (supraventricular tachycardia) (HCC)    Past Surgical History:  Procedure Laterality Date   ABDOMINAL HYSTERECTOMY     AXILLARY SENTINEL NODE BIOPSY Left 07/09/2021   Procedure: LEFT AXILLARY SENTINEL NODE BIOPSY;  Surgeon: Abigail Miyamoto, MD;  Location: [facility];  Service: General;  Laterality: Left;   BIOPSY  06/29/2022   Procedure: BIOPSY;  Surgeon: Sherrilyn Rist, MD;  Location: [facility];  Service: Gastroenterology;;   BREAST EXCISIONAL BIOPSY Right 09/16/2009   BREAST LUMPECTOMY WITH RADIOACTIVE SEED LOCALIZATION Bilateral 07/09/2021   Procedure: BILATERAL BREAST LUMPECTOMY WITH RADIOACTIVE SEED LOCALIZATION;  Surgeon: Abigail Miyamoto, MD;  Location: [facility];  Service: General;  Laterality: Bilateral;   BREAST SURGERY     lumpectomy    ESOPHAGOGASTRODUODENOSCOPY (EGD) WITH PROPOFOL N/A 06/29/2022   Procedure: ESOPHAGOGASTRODUODENOSCOPY (EGD) WITH PROPOFOL;  Surgeon: Sherrilyn Rist, MD;  Location: [facility];  Service: Gastroenterology;  Laterality: N/A;   PORT-A-CATH REMOVAL Left 07/09/2021   Procedure: REMOVAL PORT-A-CATH;  Surgeon: Abigail Miyamoto, MD;  Location: [facility];  Service: General;  Laterality: Left;   PORTACATH PLACEMENT Left 01/26/2021   Procedure: INSERTION PORT-A-CATH;  Surgeon: Abigail Miyamoto, MD;  Location: [facility];  Service: General;  Laterality: Left;   RADIOACTIVE SEED GUIDED AXILLARY SENTINEL LYMPH NODE Right 07/09/2021   Procedure: RADIOACTIVE SEED GUIDED RIGHT AXILLARY SENTINEL LYMPH NODE DISSECTION;  Surgeon: Abigail Miyamoto, MD;  Location: [facility];  Service: General;  Laterality: Right;   TUBAL LIGATION     Patient Active Problem List   Diagnosis Date Noted   Neoplasm related pain 12/07/2022   Chronic constipation 06/30/2022   Nausea and vomiting in adult 06/29/2022   Hematemesis 06/28/2022   Metastasis to bone (HCC) 05/20/2022   PSVT (paroxysmal supraventricular tachycardia) (HCC) 07/08/2021   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   GERD (gastroesophageal reflux disease) 05/27/2021   Hyperlipidemia 05/27/2021   Hypertension 05/27/2021   Pain management contract signed 05/04/2021   Chronic pain syndrome (breast cancer) 05/04/2021   Palpitations 03/25/2021   Sinus bradycardia 03/25/2021  Daytime somnolence 03/25/2021   Snoring 03/25/2021   Obesity (BMI 30-39.9) 03/25/2021   Port-A-Cath in place 01/27/2021   Genetic testing 12/24/2020   Family history of uterine cancer 12/17/2020   Malignant neoplasm of upper-outer quadrant of right breast in female, estrogen receptor positive (HCC) 12/12/2020   Malignant neoplasm of upper-outer quadrant of left breast in female, estrogen receptor positive (HCC) 12/12/2020   Hypokalemia 01/02/2012   Asthma  01/01/2012   Bradycardia 01/01/2012   Syncope 12/31/2011   Mediastinal adenopathy 12/31/2011   Anemia 12/31/2011   Chest pain 12/31/2011       REFERRING PROVIDER: Serena Croissant, MD  REFERRING DIAG: Lymphedema  THERAPY DIAG:  Malignant neoplasm of upper-outer quadrant of right breast in female, estrogen receptor positive (HCC)  Lymphedema, not elsewhere classified  Abnormal posture  Malignant neoplasm of upper-outer quadrant of left breast in female, estrogen receptor positive (HCC)  Stiffness of right shoulder, not elsewhere classified  Stiffness of left shoulder, not elsewhere classified  ONSET DATE: 1 month ago  Rationale for Evaluation and Treatment: Rehabilitation  SUBJECTIVE:                                                                                                                                                                                           SUBJECTIVE STATEMENT:   06/07/2023 Have not been to [place] yet. Have started back to some of my exercises and its feeling better.   EVAL Bilateral axillary regions started swelling again,  and right breast greater than left breast, and both of my chest muscles  are hurting again. I started back to wearing my sports bra. I have been using my Flexi touch every day. For a little while the swelling feels better, but it always comes back. I need a new compression bra. I have been working a lot, and I took myself off of night shift. I feel like work contributed to my swelling.  PERTINENT HISTORY:  Neoadjuvant chemotherapy with Doxorubicin and Cyclophosphamide given x 4 beginning 01/27/2021 and completing on 03/10/2021 followed by weekly paclitaxel x 12 beginning 05/2021 07/09/2021: Right lumpectomy: Grade 2 invasive lobular cancer, 2.5 cm, LCIS, margins negative, LCIS focally at anterior margin, 1/1 lymph node positive, ER 95%, PR 100%, HER2 negative, Ki-67 40% Left lumpectomy: High-grade DCIS: 0.7 cm, margins  negative, 0/1 lymph node negative ER 95%, PR 100%. Pt now  STAGE IV Breast Cancer with bone mets throughout thoracic and lumbar regions and is s/p radiation to her lumbar spine. Pt. sees Palliative Care for chronic pain management.    PAIN:  Are you having pain? Yes NPRS scale: 4/10 right greater  than left Pain location: bilateral lateral trunk Pain orientation: Bilateral  PAIN TYPE: aching, sharp, and tight Pain description: intermittent  Aggravating factors: a lot of lifting/work activities,grocery shopping Relieving factors: Rest, Flexi touch,sports bra  PRECAUTIONS: Stage IV breast Cancer with bone METS, bilateral Breast lymphedema  RED FLAGS: Compression fracture: Yes: last year from bone METS    WEIGHT BEARING RESTRICTIONS: No  FALLS:  Has patient fallen in last 6 months? No  LIVING ENVIRONMENT: Lives with: lives with their family and lives alone Lives in: Other hotel Stairs: Yes; Internal: 15 steps; on left going up Has following equipment at home: NA  OCCUPATION: shift mgr for [employer]  LEISURE: shop, dominoes  HAND DOMINANCE: right   PRIOR LEVEL OF FUNCTION: Independent  PATIENT GOALS: Reduce swelling, reduce pain   OBJECTIVE: Note: Objective measures were completed at Evaluation unless otherwise noted.  COGNITION: Overall cognitive status: Within functional limits for tasks assessed   PALPATION: Very tender bilateral UT, pectorals, axilla and lateral trunk, medial and lateral breasts, Right medial upper arm  OBSERVATIONS / OTHER ASSESSMENTS: Slight cording noted in right axilla to medial upper arm, very sensitive to light touch,Fibrosis noted bilateral breasts medial and lateral, Right axilla with increased swelling, mild peau d'orange right inferior breast with some enlarged pores bilaterally  SENSATION: Light touch: Deficits     POSTURE: Forward head, rounded shoulders  UPPER EXTREMITY AROM/PROM:  A/PROM RIGHT   eval   Shoulder extension 40,  tight axilla  Shoulder flexion 140 +axilla  Shoulder abduction 147,+axilla  Shoulder internal rotation 35 +  Shoulder external rotation 85+    (Blank rows = not tested)  A/PROM LEFT   eval  Shoulder extension 47  Shoulder flexion 137 axilla  Shoulder abduction 146, axilla  Shoulder internal rotation Pain shoulder;held  Shoulder external rotation     (Blank rows = not tested)  CERVICAL AROM: All within functional limits:      UPPER EXTREMITY STRENGTH:   LYMPHEDEMA ASSESSMENTS:   SURGERY TYPE/DATE: 07/09/2021 BILATERAL BREAST LUMPECTOMY WITH RADIOACTIVE SEED LOCALIZATION RADIOACTIVE SEED GUIDED RIGHT AXILLARY TARGETED LYMPH NODE DISSECTION LEFT AXILLARY SENTINEL NODE BIOPSY LEFT AXILLARY LYMPH NODE MAPPING WITH SENTIMAG  NUMBER OF LYMPH NODES REMOVED: Right 1/1, Left 0/1   CHEMOTHERAPY: Neoadjuvant  RADIATION:Bilateral Breast and lumbar spine  HORMONE TREATMENT: YES  INFECTIONS: NO   LYMPHEDEMA ASSESSMENTS:   LANDMARK RIGHT  eval  At axilla    15 cm proximal to olecranon process   10 cm proximal to olecranon process   Olecranon process   15 cm proximal to ulnar styloid process   10 cm proximal to ulnar styloid process   Just proximal to ulnar styloid process   Across hand at thumb web space   At base of 2nd digit   (Blank rows = not tested)  LANDMARK LEFT  eval  At axilla    15 cm proximal to olecranon process   10 cm proximal to olecranon process   Olecranon process   15 cm proximal to ulnar styloid process   10 cm proximal to ulnar styloid process   Just proximal to ulnar styloid process   Across hand at thumb web space   At base of 2nd digit   (Blank rows = not tested)   FUNCTIONAL TESTS:    GAIT: Distance walked: front office to treatment room 8 Assistive device utilized: None Level of assistance: Complete Independence Comments: WNL   QUICK DASH SURVEY: 55%  BREAST COMPLAINTS QUESTIONNAIRE Pain:7 Heaviness:7 Swollen  feeling:10  Tense Skin:10 Redness:8 Bra Print:10 Size of Pores:10 Hard feeling: 10 Total:   72  /80 A Score over 9 indicates lymphedema issues in the breast    TODAY'S TREATMENT:                                                                                                                                          DATE:   06/07/2023  Pulleys x 2 min flexion and abduction STM bilateral UT/pectorals, Lateral trunk with cocoa butter with multiple shortened areas especially laterally Supine wand flexion and scaption x 4 PROM bilateral shoulder flexion, scaption, abd, ER with VC's prn to relax Tried standing counter stretch but held secondary to LBP, Wall arc x 3 B Updated HEP with 4 post op exercises to perform 2x's per day, or may do supine wand in place of clasped hands flexion.  06/01/2023 Discussed treatment plan and interventions with pt including STM to decrease muscular tightness, stretching exercises for chest/shoulder to decrease muscle tightness, importance of proper compression bra (Gave pt new script), and return to MLD, CDT. Pt reviewed stargazer stretch and was reminded to start back to Abduction wall slides. Pt is not wearing a proper sports bra or compression bra but will start back to one.    PATIENT EDUCATION:  Education details: Pt reminded about importance of compression, MLD and Flexitouch use and its importance to decrease breast swelling, gentle stretching of pecs and shoulder abduction Person educated: Patient Education method: Explanation Education comprehension: verbalized understanding and returned demonstration  HOME EXERCISE PROGRAM: Resume stargazer and wall slides for abd 2x/day 5 repetitions  ASSESSMENT:  CLINICAL IMPRESSION: Performed ROM exs and initiated STM bilaterally to ease tight muscles. Very tight and tender bilateral lateral trunk and bilateral pectorals greatest on the left. PROM performed to improve mobility.Started pt back on her post  op exercises to improve ROM, and in doing so improve pain.  OBJECTIVE IMPAIRMENTS: decreased activity tolerance, decreased ROM, increased edema, impaired flexibility, impaired UE functional use, postural dysfunction, and pain.   ACTIVITY LIMITATIONS: carrying, lifting, sleeping, and reach over head  PARTICIPATION LIMITATIONS: cleaning and occupation  PERSONAL FACTORS: 3+ comorbidities: Bilateral breast cancer s/p surgery, chemo and radiation, now Stage IV Metastatic Breast Cancer with multiple bone METS in thoraacic and lumbar spine  are also affecting patient's functional outcome.   REHAB POTENTIAL: Good  CLINICAL DECISION MAKING: Stable/uncomplicated  EVALUATION COMPLEXITY: Low  GOALS: Goals reviewed with patient? Yes  SHORT TERM GOALS=LONG TERM GOALS: Target date: 07/12/2022  Pt will be independent in self MLD to decrease breast swelling Baseline: Goal status: INITIAL  2.  Pt will have breast complaints survey no greater than 20% to demonstrate improved swelling and decreased pain Baseline:  Goal status: INITIAL  3.  Pt will report breast swelling improved by atleast 35-50% Baseline:  Goal status: INITIAL  4.  Pt will improve bilateral shoulder AROM to  150 degrees for improved reaching Baseline:  Goal status: INITIAL  5.  Pt will improve bilateral shoulder abduction to 167 degrees for improved ability to work overhead Baseline:  Goal status: INITIAL  6.  Quick dash will improve to no greater than 20% to demonstrate improved function Baseline:  Goal status: INITIAL  7.  Pt will be compliant with appropriate compression bra to decrease swelling  Goal Status:INITIAL   PLAN:  PT FREQUENCY: 2x/week  PT DURATION: 6 weeks  PLANNED INTERVENTIONS: therapeutic Exercises, Manual Therapy, Self Care, Dry Needling  PLAN FOR NEXT SESSION: Did pt get compression bra? Measure arm circumferences,STM bilateral UT, Pectorals, lateral trunk, MFR right UE cording, AAROM exs to  improve ROM,, MLD to right greater than left breast and instruct pt.  Waynette Buttery, PT 06/07/2023, 2:54 PM

## 2023-06-08 ENCOUNTER — Other Ambulatory Visit: Payer: Self-pay

## 2023-06-08 ENCOUNTER — Other Ambulatory Visit (HOSPITAL_COMMUNITY): Payer: Self-pay

## 2023-06-08 ENCOUNTER — Other Ambulatory Visit: Payer: Self-pay | Admitting: Hematology and Oncology

## 2023-06-08 DIAGNOSIS — C50411 Malignant neoplasm of upper-outer quadrant of right female breast: Secondary | ICD-10-CM

## 2023-06-08 MED ORDER — ABEMACICLIB 50 MG PO TABS
50.0000 mg | ORAL_TABLET | Freq: Two times a day (BID) | ORAL | 3 refills | Status: DC
  Filled 2023-06-08: qty 56, 28d supply, fill #0
  Filled 2023-07-11 – 2023-08-08 (×2): qty 56, 28d supply, fill #1
  Filled 2023-10-03: qty 56, 28d supply, fill #2
  Filled 2023-11-01: qty 56, 28d supply, fill #3

## 2023-06-08 NOTE — Progress Notes (Signed)
Specialty Pharmacy Refill Coordination Note  Chelsea Hansen is a 52 y.o. female contacted today regarding refills of specialty medication(s) Abemaciclib [name])   Patient requested [name] at [facility] at [place] date: 06/13/23   Medication will be filled on 12.20.24 or 12.23.24.

## 2023-06-10 ENCOUNTER — Other Ambulatory Visit: Payer: Self-pay

## 2023-06-10 ENCOUNTER — Encounter (HOSPITAL_COMMUNITY)
Admission: RE | Admit: 2023-06-10 | Discharge: 2023-06-10 | Disposition: A | Discharge status: Home / Self Care | Payer: 59 | Source: Ambulatory Visit | Attending: Hematology and Oncology | Admitting: Hematology and Oncology

## 2023-06-10 DIAGNOSIS — C50411 Malignant neoplasm of upper-outer quadrant of right female breast: Secondary | ICD-10-CM | POA: Diagnosis not present

## 2023-06-10 DIAGNOSIS — C50919 Malignant neoplasm of unspecified site of unspecified female breast: Secondary | ICD-10-CM | POA: Diagnosis not present

## 2023-06-10 DIAGNOSIS — Z170 Estrogen receptor positive status [ER+]: Secondary | ICD-10-CM | POA: Diagnosis not present

## 2023-06-10 DIAGNOSIS — C7951 Secondary malignant neoplasm of bone: Secondary | ICD-10-CM | POA: Diagnosis not present

## 2023-06-10 LAB — GLUCOSE, CAPILLARY: Glucose-Capillary: 95 mg/dL (ref 70–99)

## 2023-06-10 MED ORDER — FLUDEOXYGLUCOSE F - 18 (FDG) INJECTION
10.2900 | Freq: Once | INTRAVENOUS | Status: AC
  Administered 2023-06-10: 10.29 via INTRAVENOUS

## 2023-06-14 ENCOUNTER — Other Ambulatory Visit: Payer: Self-pay | Admitting: Nurse Practitioner

## 2023-06-14 ENCOUNTER — Other Ambulatory Visit (HOSPITAL_COMMUNITY): Payer: Self-pay

## 2023-06-14 ENCOUNTER — Other Ambulatory Visit: Payer: Self-pay

## 2023-06-14 DIAGNOSIS — G893 Neoplasm related pain (acute) (chronic): Secondary | ICD-10-CM

## 2023-06-14 DIAGNOSIS — Z515 Encounter for palliative care: Secondary | ICD-10-CM

## 2023-06-14 DIAGNOSIS — C50411 Malignant neoplasm of upper-outer quadrant of right female breast: Secondary | ICD-10-CM

## 2023-06-14 DIAGNOSIS — C7951 Secondary malignant neoplasm of bone: Secondary | ICD-10-CM

## 2023-06-15 ENCOUNTER — Other Ambulatory Visit (HOSPITAL_COMMUNITY): Payer: Self-pay

## 2023-06-15 MED ORDER — OXYCODONE-ACETAMINOPHEN 7.5-325 MG PO TABS
1.0000 | ORAL_TABLET | Freq: Four times a day (QID) | ORAL | 0 refills | Status: DC | PRN
  Filled 2023-06-15: qty 102, 13d supply, fill #0

## 2023-06-16 ENCOUNTER — Other Ambulatory Visit (HOSPITAL_COMMUNITY): Payer: Self-pay

## 2023-06-16 ENCOUNTER — Inpatient Hospital Stay (HOSPITAL_BASED_OUTPATIENT_CLINIC_OR_DEPARTMENT_OTHER): Payer: 59 | Admitting: Hematology and Oncology

## 2023-06-16 DIAGNOSIS — Z170 Estrogen receptor positive status [ER+]: Secondary | ICD-10-CM | POA: Diagnosis not present

## 2023-06-16 DIAGNOSIS — C50411 Malignant neoplasm of upper-outer quadrant of right female breast: Secondary | ICD-10-CM

## 2023-06-16 NOTE — Progress Notes (Unsigned)
Oley Balm, MD  Claudean Kinds PROCEDURE / BIOPSY REVIEW Date: 06/16/23  Requested Biopsy site: L submandibular LAN Reason for request: PET+ h/o breast CA Imaging review: Best seen on PET CT 06/10/23  Decision: Approved Imaging modality to perform: Ultrasound Schedule with: Moderate Sedation Schedule for: Any VIR  Additional comments: @Schedulers . Have [place] look 1st on day of procedure before pt goes to short stay to make sure we can localize lesion, it is indistinct on CT  Please contact me with questions, concerns, or if issue pertaining to this request arise.  Dayne Oley Balm, MD [facility]       Previous Messages    ----- Message ----- From: Claudean Kinds Sent: 06/16/2023  11:05 AM EST To: Claudean Kinds; Ir Procedure Requests Subject: [place] Core Biopsy ( lymph nodes)                  Procedure : [place] Core Biopsy ( lymph nodes)  Reason; Ultrasound-guided submandibular lymph node biopsy Dx: Malignant neoplasm of upper-outer quadrant of right breast in female, estrogen receptor positive (HCC) [C50.411, Z17.0 (ICD-10-CM)]   Kindly schedule this as soon as possible because the treatment is being held for the biopsy result.    History : NM PET skull base to thigh , Mammogram   Provider : Serena Croissant, MD  Provider contact:  [phone]

## 2023-06-16 NOTE — Assessment & Plan Note (Signed)
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  ----------------------------------------------------------------------------------------------------------------------------- Current treatment: Verzinio (started 05/21/2022) with anastrozole Verzinio dose: 50 p.o. twice daily Toxicities: Nausea: Taking premedications   06/02/2022: PET CT scan: No FDG activity in the multifocal sclerotic and mixed lytic and sclerotic lesions involving the spine, ribs, sacrum, pelvic bones and bilateral proximal femurs. 11/21/2022: CT CAP: Bone mets unchanged, 5 mm right lobe of the liver: Stable sigmoid colon: Mild diverticulitis 11/21/2022: Bone scan: Mild activity at L3 vertebral body compression fracture, stable 12/15/2022: MRI lumbar spine: Stable pathologic fracture L3 stable other bone mets 06/10/2023: PET/CT:

## 2023-06-16 NOTE — Progress Notes (Signed)
HEMATOLOGY-ONCOLOGY TELEPHONE VISIT PROGRESS NOTE  I connected with our patient on 06/16/23 at  8:45 AM EST by telephone and verified that I am speaking with the correct person using two identifiers.  I discussed the limitations, risks, security and privacy concerns of performing an evaluation and management service by telephone and the availability of in person appointments.  I also discussed with the patient that there may be a patient responsible charge related to this service. The patient expressed understanding and agreed to proceed.   History of Present Illness: Follow-up of PET CT scan  History of Present Illness   Chelsea Hansen, a patient with a known history of breast cancer, recently underwent a PET CT scan. The scan results revealed new lymph nodes in various locations, including her chest, below her jaw, and in the upper part of her abdomen. These findings are unusual for her current diagnosis of breast cancer, raising concerns about a possible new cancerous condition. The patient was not experiencing any specific symptoms related to these new findings at the time of the conversation.        Oncology History  Malignant neoplasm of upper-outer quadrant of right breast in female, estrogen receptor positive (HCC)  12/12/2020 Initial Diagnosis   status post bilateral breast biopsies 12/03/2020, showing             (1) on the right, a clinical T2 N1, stage IIa invasive lobular carcinoma, grade 2, estrogen and progesterone receptor strongly positive, HER2 not amplified, with an MIB-1 of 40%                         (a) the biopsied right axillary lymph node was positive with extracapsular extension                         (b) a second right breast mass also biopsied was a fibroadenoma, concordant  MAMMAPRINT tested on biopsy returned high risk, luminal type B, indicating significant benefit from chemo                         (c) biopsy of an area of non-mass-like enhancement in the upper right  breast pending   12/17/2020 Cancer Staging   Staging form: Breast, AJCC 8th Edition - Clinical stage from 12/17/2020: Stage IIA (cT2, cN1, cM0, G2, ER+, PR+, HER2-) - Signed by Loa Socks, NP on 01/14/2022 Stage prefix: Initial diagnosis Histologic grading system: 3 grade system Laterality: Right Staged by: Pathologist and managing physician Stage used in treatment planning: Yes National guidelines used in treatment planning: Yes Type of national guideline used in treatment planning: NCCN   12/24/2020 Genetic Testing   Negative genetic testing:  No pathogenic variants detected on the [facility] BRCAplus panel (report date 12/24/2020) or the CancerNext-Expanded + RNAinsight panel (report date 12/31/2020). 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   The BRCAplus panel offered by [employer] and includes sequencing and deletion/duplication analysis for the following 8 genes: ATM, BRCA1, BRCA2, CDH1, CHEK2, PALB2, PTEN, and TP53. The CancerNext-Expanded + RNAinsight gene panel offered by [employer] and includes sequencing and rearrangement analysis for the following 77 genes: AIP, ALK, APC, ATM, AXIN2, BAP1, BARD1, BLM, BMPR1A, BRCA1, BRCA2, BRIP1, CDC73, CDH1, CDK4, CDKN1B, CDKN2A, CHEK2, CTNNA1, DICER1, FANCC, FH, FLCN, GALNT12, KIF1B, LZTR1, MAX, MEN1, MET, MLH1, MSH2, MSH3, MSH6, MUTYH, NBN, NF1, NF2, NTHL1, PALB2, PHOX2B,  PMS2, POT1, PRKAR1A, PTCH1, PTEN, RAD51C, RAD51D, RB1, RECQL, RET, SDHA, SDHAF2, SDHB, SDHC, SDHD, SMAD4, SMARCA4, SMARCB1, SMARCE1, STK11, SUFU, TMEM127, TP53, TSC1, TSC2, VHL and XRCC2 (sequencing and deletion/duplication); EGFR, EGLN1, HOXB13, KIT, MITF, PDGFRA, POLD1 and POLE (sequencing only); EPCAM and GREM1 (deletion/duplication only). RNA data is routinely analyzed for use in variant interpretation for all genes.   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 Multifocal sclerotic bony metastatic disease greatest in the thoracic spine with scattered rib lesions and sclerosis as well. 2. No nodal or visceral metastasis about the chest. 3. Collateral pathways about the chest suggest some subclavian venous narrowing on the LEFT. This could also be related to arm position. Correlate clinically. 4. Signs of RIGHT breast lumpectomy. Potential postoperative changes in the LEFT breast with small focal area of nodularity in the lateral LEFT breast which could also be postoperative, correlate clinically and consider mammographic correlation as warranted. 5. Mild cardiac enlargement.   05/18/2022 Treatment Plan Change   Anastrozole + Verzenio 100mg  PO BID   12/07/2022 Cancer Staging   Staging form: Breast, AJCC 8th Edition - Pathologic: Stage IV (cM1) - Signed by Loa Socks, NP on 12/07/2022   Malignant neoplasm of upper-outer quadrant of left breast in female, estrogen receptor positive (HCC)  12/12/2020 Initial Diagnosis   on the left, a  clinical T1b N0, stage IA invasive ductal carcinoma, grade 1 or 2, estrogen and progesterone receptor positive, HER2 not amplified, with an MIB 1 of 15%   12/17/2020 Cancer Staging   Staging form: Breast, AJCC 8th Edition - Clinical stage from 12/17/2020: Stage IA (cT1b, cN0, cM0, G2, ER+, PR+, HER2-) - Signed by Loa Socks, NP on 01/14/2022 Stage prefix: Initial diagnosis Histologic grading system: 3 grade system Laterality: Left Staged by: Pathologist and managing physician Stage used in treatment planning: Yes National guidelines used in treatment planning: Yes Type of national guideline used in treatment planning: NCCN   12/24/2020 Genetic Testing   Negative genetic testing:  No pathogenic variants detected on the [facility] BRCAplus panel (report date 12/24/2020) or the CancerNext-Expanded + RNAinsight panel (report date 12/31/2020). 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   The BRCAplus panel offered by [employer] and includes sequencing and deletion/duplication analysis for the following 8 genes: ATM, BRCA1, BRCA2, CDH1, CHEK2, PALB2, PTEN, and TP53. The CancerNext-Expanded + RNAinsight gene panel offered by [employer] and includes sequencing and rearrangement analysis for the following 77 genes: AIP, ALK, APC, ATM, AXIN2, BAP1, BARD1, BLM, BMPR1A, BRCA1, BRCA2, BRIP1,  CDC73, CDH1, CDK4, CDKN1B, CDKN2A, CHEK2, CTNNA1, DICER1, FANCC, FH, FLCN, GALNT12, KIF1B, LZTR1, MAX, MEN1, MET, MLH1, MSH2, MSH3, MSH6, MUTYH, NBN, NF1, NF2, NTHL1, PALB2, PHOX2B, PMS2, POT1, PRKAR1A, PTCH1, PTEN, RAD51C, RAD51D, RB1, RECQL, RET, SDHA, SDHAF2, SDHB, SDHC, SDHD, SMAD4, SMARCA4, SMARCB1, SMARCE1, STK11, SUFU, TMEM127, TP53, TSC1, TSC2, VHL and XRCC2 (sequencing and deletion/duplication); EGFR, EGLN1, HOXB13, KIT, MITF, PDGFRA, POLD1 and POLE (sequencing only); EPCAM and GREM1 (deletion/duplication only). RNA data is routinely analyzed for use in  variant interpretation for all genes.   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 Multifocal sclerotic bony metastatic disease greatest in the thoracic spine with scattered rib lesions and sclerosis as well. 2. No nodal or visceral metastasis about the chest. 3. Collateral pathways about the chest suggest some subclavian venous narrowing on the LEFT. This could also be related to arm position. Correlate clinically. 4. Signs of RIGHT breast lumpectomy. Potential postoperative changes in the LEFT breast with small focal area of nodularity in the lateral LEFT breast which could also be postoperative, correlate clinically and consider mammographic correlation as warranted. 5. Mild cardiac enlargement.   05/18/2022 Treatment Plan Change   Anastrozole + Verzenio 100mg  PO BID   06/23/2022 - 07/07/2022 Radiation Therapy   06/23/2022 through 07/07/2022 Site Technique Total Dose (Gy) Dose per Fx (Gy) Completed Fx Beam Energies  Lumbar Spine: Spine_LS_Pelv 3D 30/30 3 10/10 15X       REVIEW OF  SYSTEMS:   Constitutional: Denies fevers, chills or abnormal weight loss All other systems were reviewed with the patient and are negative. Observations/Objective:     Assessment Plan:  Malignant neoplasm of upper-outer quadrant of right breast in female, estrogen receptor positive (HCC) 12/03/2020: Bilateral breast biopsies: Right breast: T2N1 stage IIa grade 2 ILC, ER/PR positive, HER2 negative, Ki-67 40%, right axillary lymph node positive with extracapsular extension MammaPrint: High risk   01/27/2021 -06/08/2021 neoadjuvant chemotherapy with dose dense Adriamycin and Cytoxan x4 followed by Taxol x11   07/09/2021:Right lumpectomy: Grade 2 invasive lobular cancer, 2.5 cm, LCIS, margins negative, LCIS focally at anterior margin, 1/1 lymph node positive, ER 95%, PR 100%, HER2 negative, Ki-67 40% 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  ----------------------------------------------------------------------------------------------------------------------------- Current treatment: Verzinio (started 05/21/2022) with anastrozole Verzinio dose: 50 p.o. twice daily Toxicities: Nausea: Taking premedications   06/02/2022: PET CT scan: No FDG activity in the multifocal sclerotic and mixed lytic and sclerotic lesions involving the  spine, ribs, sacrum, pelvic bones and bilateral proximal femurs. 11/21/2022: CT CAP: Bone mets unchanged, 5 mm right lobe of the liver: Stable sigmoid colon: Mild diverticulitis 11/21/2022: Bone scan: Mild activity at L3 vertebral body compression fracture, stable 12/15/2022: MRI lumbar spine: Stable pathologic fracture L3 stable other bone mets 06/10/2023: PET/CT: --------------------------------- Assessment and Plan    Enlarged Lymph Nodes New lymph nodes identified in  the chest, below the jaw, and upper part of the abdomen on PET CT scan. The radiologist's report suggests these are not typical for breast cancer and could be due to infection or another condition such as lymphoma. -Plan to perform a biopsy of the most accessible lymph node (below the jaw) to determine the cause of the lymphadenopathy. -Will contact a radiologist to assess feasibility of this approach. If not feasible, will consult with a pulmonologist for the biopsy. -Once biopsy results are available (expected within 5-6 days of procedure), will schedule a follow-up appointment to discuss results and next steps.  Breast Cancer Current status not discussed in this conversation. -No changes to current management plan discussed.  General Health Maintenance / Followup Plans -Schedule follow-up appointment after biopsy results are available.         I discussed the assessment and treatment plan with the patient. The patient was provided an opportunity to ask questions and all were answered. The patient agreed with the plan and demonstrated an understanding of the instructions. The patient was advised to call back or seek an in-person evaluation if the symptoms worsen or if the condition fails to improve as anticipated.   I provided 12 minutes of non-face-to-face time during this encounter.  This includes time for charting and coordination of care   Tamsen Meek, MD

## 2023-06-17 ENCOUNTER — Telehealth: Payer: Self-pay | Admitting: Hematology and Oncology

## 2023-06-17 NOTE — Telephone Encounter (Signed)
Patient is aware of scheduled appointment times/dates

## 2023-06-20 ENCOUNTER — Ambulatory Visit (INDEPENDENT_AMBULATORY_CARE_PROVIDER_SITE_OTHER): Payer: 59 | Admitting: Primary Care

## 2023-06-20 VITALS — BP 164/97

## 2023-06-20 DIAGNOSIS — Z0130 Encounter for examination of blood pressure without abnormal findings: Secondary | ICD-10-CM

## 2023-06-20 NOTE — Progress Notes (Signed)
Blood Pressure Recheck Visit  Name: Chelsea Hansen MRN: [id number] Date of Birth: September 08, 1970  [name] presents today for Blood Pressure recheck with clinical support staff.    BP Readings from Last 3 Encounters:  06/20/23 (!) 164/97  05/26/23 (!) 154/85  05/26/23 (!) 161/80    Current Outpatient Medications  Medication Sig Dispense Refill   abemaciclib (VERZENIO) 50 MG tablet Take 1 tablet (50 mg total) by mouth 2 (two) times daily. 56 tablet 3   albuterol (VENTOLIN HFA) 108 (90 Base) MCG/ACT inhaler Inhale 2 puffs by mouth into the lungs every 6 hours as needed for wheezing or shortness of breath. 18 g 2   amLODipine (NORVASC) 5 MG tablet Take 1 tablet (5 mg total) by mouth daily. 90 tablet 0   anastrozole (ARIMIDEX) 1 MG tablet Take 1 tablet (1 mg total) by mouth daily. 90 tablet 3   calcium-vitamin D (OSCAL WITH D) 500-5 MG-MCG tablet Take 1 tablet by mouth daily with supper. 90 tablet 2   gabapentin (NEURONTIN) 300 MG capsule Take 1 capsule (300 mg total) by mouth 3 (three) times daily. 90 capsule 6   montelukast (SINGULAIR) 10 MG tablet Take 1 tablet (10 mg total) by mouth at bedtime. 90 tablet 3   ondansetron (ZOFRAN) 4 MG tablet Take 1 tablet (4 mg total) by mouth every 8 (eight) hours. 90 tablet 11   oxyCODONE ER 9 MG C12A Take 1 capsule by mouth 2 (two) times daily. 60 capsule 0   oxyCODONE-acetaminophen (PERCOCET) 7.5-325 MG tablet Take 1 - 2 tablets by mouth every 6 (six) hours as needed for severe pain. 102 tablet 0   pantoprazole (PROTONIX) 40 MG tablet Take 1 tablet (40 mg total) by mouth daily. 30 tablet 3   polyethylene glycol (MIRALAX / GLYCOLAX) 17 g packet Take 17 g by mouth 2 (two) times daily. 60 each 3   prochlorperazine (COMPAZINE) 10 MG tablet Take 1 tablet (10 mg total) by mouth every 6 (six) hours as needed for nausea or vomiting. 30 tablet 0   propranolol (INDERAL) 10 MG tablet Take 1 tablet (10 mg total) by mouth 2 (two) times daily. Please call  [phone] to schedule an appointment for future refills. Thank you. 3rd attempt. 30 tablet 0   senna (SENOKOT) 8.6 MG TABS tablet Take 2 tablets (17.2 mg total) by mouth at bedtime. 60 tablet 3   No current facility-administered medications for this visit.    Hypertensive Medication Review: Patient states that they are taking all their hypertensive medications as prescribed and their last dose of hypertensive medications was this morning   Documentation of any medication adherence discrepancies: none  Provider Recommendation:  Patient came in today for initially a Bp f/u . She actually wanted PCP to know that the day after Christmas she was notified of mets and would to discuss tx. Patient shared she went through this before and not sure if she would be able to handle disappointment again. Emotional visit PCP cried as patient held her stating it will be ok we will get through this. You found the breast cancer . At this point PCP reached out to clinical nurse PCP was devastated. Reached out to oncologist to review chart and clarify how /why this occurred . Just submitted MRN to him today 07/06/23. Unable to complete charting until now.    Patient has been given provider's recommendations and does not have any questions or concerns at this time. Patient will contact the office for  any future questions or concerns.

## 2023-06-21 ENCOUNTER — Ambulatory Visit: Payer: 59

## 2023-06-21 DIAGNOSIS — M25611 Stiffness of right shoulder, not elsewhere classified: Secondary | ICD-10-CM | POA: Diagnosis not present

## 2023-06-21 DIAGNOSIS — I890 Lymphedema, not elsewhere classified: Secondary | ICD-10-CM

## 2023-06-21 DIAGNOSIS — C50412 Malignant neoplasm of upper-outer quadrant of left female breast: Secondary | ICD-10-CM

## 2023-06-21 DIAGNOSIS — R293 Abnormal posture: Secondary | ICD-10-CM | POA: Diagnosis not present

## 2023-06-21 DIAGNOSIS — C50411 Malignant neoplasm of upper-outer quadrant of right female breast: Secondary | ICD-10-CM

## 2023-06-21 DIAGNOSIS — Z170 Estrogen receptor positive status [ER+]: Secondary | ICD-10-CM | POA: Diagnosis not present

## 2023-06-21 DIAGNOSIS — M25612 Stiffness of left shoulder, not elsewhere classified: Secondary | ICD-10-CM | POA: Diagnosis not present

## 2023-06-21 DIAGNOSIS — I972 Postmastectomy lymphedema syndrome: Secondary | ICD-10-CM | POA: Diagnosis not present

## 2023-06-21 NOTE — Therapy (Signed)
 OUTPATIENT PHYSICAL THERAPY  UPPER EXTREMITY ONCOLOGY EVALUATION  Patient Name: Chelsea Hansen MRN: [id number] DOB:02-Jun-1971, 52 y.o., female Today's Date: 06/21/2023  END OF SESSION:  PT End of Session - 06/21/23 1510     Visit Number 3    Number of Visits 12    Date for PT Re-Evaluation 07/13/23    PT Start Time 1510    PT Stop Time 1559    PT Time Calculation (min) 49 min    Activity Tolerance Patient tolerated treatment well    Behavior During Therapy [facility] for tasks assessed/performed             Past Medical History:  Diagnosis Date   Allergy    seasonal allergies   Anxiety    on meds   Asthma    uses inhaler   Breast cancer (HCC) 2012   RIGHT lumpectomy   Cancer (HCC) 2022   RIGHT breast lump-dx 2022   Depression    on meds   DVT (deep venous thrombosis) (HCC) 2010   after hysterectomy   Family history of uterine cancer    GERD (gastroesophageal reflux disease)    with certain foods/OTC PRN meds   Headache(784.0)    History of radiation therapy    Bilateral breast- 09/03/21-11/02/21- Dr. Lynwood Nasuti   Hyperlipidemia    on meds   Hypertension    on meds   SVT (supraventricular tachycardia) (HCC)    Past Surgical History:  Procedure Laterality Date   ABDOMINAL HYSTERECTOMY     AXILLARY SENTINEL NODE BIOPSY Left 07/09/2021   Procedure: LEFT AXILLARY SENTINEL NODE BIOPSY;  Surgeon: [name], MD;  Location: [facility];  Service: General;  Laterality: Left;   BIOPSY  06/29/2022   Procedure: BIOPSY;  Surgeon: Legrand Victory LITTIE DOUGLAS, MD;  Location: [facility];  Service: Gastroenterology;;   BREAST EXCISIONAL BIOPSY Right 09/16/2009   BREAST LUMPECTOMY WITH RADIOACTIVE SEED LOCALIZATION Bilateral 07/09/2021   Procedure: BILATERAL BREAST LUMPECTOMY WITH RADIOACTIVE SEED LOCALIZATION;  Surgeon: [name], MD;  Location: [facility];  Service: General;  Laterality: Bilateral;   BREAST SURGERY     lumpectomy    ESOPHAGOGASTRODUODENOSCOPY (EGD) WITH PROPOFOL  N/A 06/29/2022   Procedure: ESOPHAGOGASTRODUODENOSCOPY (EGD) WITH PROPOFOL ;  Surgeon: Legrand Victory LITTIE DOUGLAS, MD;  Location: [facility];  Service: Gastroenterology;  Laterality: N/A;   PORT-A-CATH REMOVAL Left 07/09/2021   Procedure: REMOVAL PORT-A-CATH;  Surgeon: [name], MD;  Location: [facility];  Service: General;  Laterality: Left;   PORTACATH PLACEMENT Left 01/26/2021   Procedure: INSERTION PORT-A-CATH;  Surgeon: [name], MD;  Location: [facility];  Service: General;  Laterality: Left;   RADIOACTIVE SEED GUIDED AXILLARY SENTINEL LYMPH NODE Right 07/09/2021   Procedure: RADIOACTIVE SEED GUIDED RIGHT AXILLARY SENTINEL LYMPH NODE DISSECTION;  Surgeon: [name], MD;  Location: [facility];  Service: General;  Laterality: Right;   TUBAL LIGATION     Patient Active Problem List   Diagnosis Date Noted   Neoplasm related pain 12/07/2022   Chronic constipation 06/30/2022   Nausea and vomiting in adult 06/29/2022   Hematemesis 06/28/2022   Metastasis to bone (HCC) 05/20/2022   PSVT (paroxysmal supraventricular tachycardia) (HCC) 07/08/2021   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   GERD (gastroesophageal reflux disease) 05/27/2021   Hyperlipidemia 05/27/2021   Hypertension 05/27/2021   Pain management contract signed 05/04/2021   Chronic pain syndrome (breast cancer) 05/04/2021   Palpitations 03/25/2021   Sinus bradycardia 03/25/2021  Daytime somnolence 03/25/2021   Snoring 03/25/2021   Obesity (BMI 30-39.9) 03/25/2021   Port-A-Cath in place 01/27/2021   Genetic testing 12/24/2020   Family history of uterine cancer 12/17/2020   Malignant neoplasm of upper-outer quadrant of right breast in female, estrogen receptor positive (HCC) 12/12/2020   Malignant neoplasm of upper-outer quadrant of left breast in female, estrogen receptor positive (HCC) 12/12/2020   Hypokalemia 01/02/2012   Asthma  01/01/2012   Bradycardia 01/01/2012   Syncope 12/31/2011   Mediastinal adenopathy 12/31/2011   Anemia 12/31/2011   Chest pain 12/31/2011       REFERRING PROVIDER: Mackey Chad, MD  REFERRING DIAG: Lymphedema  THERAPY DIAG:  Malignant neoplasm of upper-outer quadrant of right breast in female, estrogen receptor positive (HCC)  Lymphedema, not elsewhere classified  Abnormal posture  Malignant neoplasm of upper-outer quadrant of left breast in female, estrogen receptor positive (HCC)  ONSET DATE: 1 month ago  Rationale for Evaluation and Treatment: Rehabilitation  SUBJECTIVE:                                                                                                                                                                                           SUBJECTIVE STATEMENT:   I have to have a procedure to remove a LN because they think the cancer has spread and they said I need to have 48 hrs of rest. Off and on swelling in right breast and lateral trunk.  ROM is doing pretty well. MD told me to wait before getting a new compression bra until they do the next procedure.  EVAL Bilateral axillary regions started swelling again,  and right breast greater than left breast, and both of my chest muscles  are hurting again. I started back to wearing my sports bra. I have been using my Flexi touch every day. For a little while the swelling feels better, but it always comes back. I need a new compression bra. I have been working a lot, and I took myself off of night shift. I feel like work contributed to my swelling.  PERTINENT HISTORY:  Neoadjuvant chemotherapy with Doxorubicin  and Cyclophosphamide  given x 4 beginning 01/27/2021 and completing on 03/10/2021 followed by weekly paclitaxel  x 12 beginning 05/2021 07/09/2021: Right lumpectomy: Grade 2 invasive lobular cancer, 2.5 cm, LCIS, margins negative, LCIS focally at anterior margin, 1/1 lymph node positive, ER 95%, PR 100%, HER2  negative, Ki-67 40% Left lumpectomy: High-grade DCIS: 0.7 cm, margins negative, 0/1 lymph node negative ER 95%, PR 100%. Pt now  STAGE IV Breast Cancer with bone mets throughout thoracic and lumbar regions and is s/p radiation to her lumbar spine.  Pt. sees Palliative Care for chronic pain management.    PAIN:  Are you having pain? Yes NPRS scale: 3/10 right greater than left axilla Pain location: bilateral lateral trunk Pain orientation: Bilateral  PAIN TYPE: aching, sharp, and tight Pain description: intermittent  Aggravating factors: a lot of lifting/work activities,grocery shopping Relieving factors: Rest, Flexi touch,sports bra  PRECAUTIONS: Stage IV breast Cancer with bone METS, bilateral Breast lymphedema  RED FLAGS: Compression fracture: Yes: last year from bone METS    WEIGHT BEARING RESTRICTIONS: No  FALLS:  Has patient fallen in last 6 months? No  LIVING ENVIRONMENT: Lives with: lives with their family and lives alone Lives in: Other hotel Stairs: Yes; Internal: 15 steps; on left going up Has following equipment at home: NA  OCCUPATION: shift mgr for [employer]  LEISURE: shop, dominoes  HAND DOMINANCE: right   PRIOR LEVEL OF FUNCTION: Independent  PATIENT GOALS: Reduce swelling, reduce pain   OBJECTIVE: Note: Objective measures were completed at Evaluation unless otherwise noted.  COGNITION: Overall cognitive status: Within functional limits for tasks assessed   PALPATION: Very tender bilateral UT, pectorals, axilla and lateral trunk, medial and lateral breasts, Right medial upper arm  OBSERVATIONS / OTHER ASSESSMENTS: Slight cording noted in right axilla to medial upper arm, very sensitive to light touch,Fibrosis noted bilateral breasts medial and lateral, Right axilla with increased swelling, mild peau d'orange right inferior breast with some enlarged pores bilaterally  SENSATION: Light touch: Deficits     POSTURE: Forward head, rounded  shoulders  UPPER EXTREMITY AROM/PROM:  A/PROM RIGHT   eval   Shoulder extension 40, tight axilla  Shoulder flexion 140 +axilla  Shoulder abduction 147,+axilla  Shoulder internal rotation 35 +  Shoulder external rotation 85+    (Blank rows = not tested)  A/PROM LEFT   eval  Shoulder extension 47  Shoulder flexion 137 axilla  Shoulder abduction 146, axilla  Shoulder internal rotation Pain shoulder;held  Shoulder external rotation     (Blank rows = not tested)  CERVICAL AROM: All within functional limits:      UPPER EXTREMITY STRENGTH:   LYMPHEDEMA ASSESSMENTS:   SURGERY TYPE/DATE: 07/09/2021 BILATERAL BREAST LUMPECTOMY WITH RADIOACTIVE SEED LOCALIZATION RADIOACTIVE SEED GUIDED RIGHT AXILLARY TARGETED LYMPH NODE DISSECTION LEFT AXILLARY SENTINEL NODE BIOPSY LEFT AXILLARY LYMPH NODE MAPPING WITH SENTIMAG  NUMBER OF LYMPH NODES REMOVED: Right 1/1, Left 0/1   CHEMOTHERAPY: Neoadjuvant  RADIATION:Bilateral Breast and lumbar spine  HORMONE TREATMENT: YES  INFECTIONS: NO   LYMPHEDEMA ASSESSMENTS:   LANDMARK RIGHT  eval  At axilla    15 cm proximal to olecranon process   10 cm proximal to olecranon process   Olecranon process   15 cm proximal to ulnar styloid process   10 cm proximal to ulnar styloid process   Just proximal to ulnar styloid process   Across hand at thumb web space   At base of 2nd digit   (Blank rows = not tested)  LANDMARK LEFT  eval  At axilla    15 cm proximal to olecranon process   10 cm proximal to olecranon process   Olecranon process   15 cm proximal to ulnar styloid process   10 cm proximal to ulnar styloid process   Just proximal to ulnar styloid process   Across hand at thumb web space   At base of 2nd digit   (Blank rows = not tested)   FUNCTIONAL TESTS:    GAIT: Distance walked: front office to treatment room 8 Assistive device  utilized: None Level of assistance: Complete Independence Comments: WNL   QUICK DASH  SURVEY: 55%  BREAST COMPLAINTS QUESTIONNAIRE Pain:7 Heaviness:7 Swollen feeling:10 Tense Skin:10 Redness:8 Bra Print:10 Size of Pores:10 Hard feeling: 10 Total:   72  /80 A Score over 9 indicates lymphedema issues in the breast    TODAY'S TREATMENT:                                                                                                                                          DATE:   Wall arc x 3 B to stretch lat trunk on opposite side. Supine wand exs x 3 flexion and scaption STM bilateral UT/pectorals, Lateral trunk with cocoa butter with multiple shortened areas especially laterally PROM bilateral shoulder flexion, scaption, abd, ER with VC's prn to relax Supine horizontal abd bilaterally x 5, scaption x 5  06/07/2023  Pulleys x 2 min flexion and abduction STM bilateral UT/pectorals, Lateral trunk with cocoa butter with multiple shortened areas especially laterally Supine wand flexion and scaption x 4 PROM bilateral shoulder flexion, scaption, abd, ER with VC's prn to relax Tried standing counter stretch but held secondary to LBP, Wall arc x 3 B Updated HEP with 4 post op exercises to perform 2x's per day, or may do supine wand in place of clasped hands flexion.  06/01/2023 Discussed treatment plan and interventions with pt including STM to decrease muscular tightness, stretching exercises for chest/shoulder to decrease muscle tightness, importance of proper compression bra (Gave pt new script), and return to MLD, CDT. Pt reviewed stargazer stretch and was reminded to start back to Abduction wall slides. Pt is not wearing a proper sports bra or compression bra but will start back to one.    PATIENT EDUCATION:  Education details: Pt reminded about importance of compression, MLD and Flexitouch use and its importance to decrease breast swelling, gentle stretching of pecs and shoulder abduction Person educated: Patient Education method: Explanation Education  comprehension: verbalized understanding and returned demonstration  HOME EXERCISE PROGRAM: Resume stargazer and wall slides for abd 2x/day 5 repetitions  ASSESSMENT:  CLINICAL IMPRESSION: Pt very tight in right greater than left pectorals and left greater than right lateral trunk. Multiple shortened areas. Pt with visibly improved shoulder ROM after treatment. Pt is pending LN biopsies on Jan 16  OBJECTIVE IMPAIRMENTS: decreased activity tolerance, decreased ROM, increased edema, impaired flexibility, impaired UE functional use, postural dysfunction, and pain.   ACTIVITY LIMITATIONS: carrying, lifting, sleeping, and reach over head  PARTICIPATION LIMITATIONS: cleaning and occupation  PERSONAL FACTORS: 3+ comorbidities: Bilateral breast cancer s/p surgery, chemo and radiation, now Stage IV Metastatic Breast Cancer with multiple bone METS in thoraacic and lumbar spine  are also affecting patient's functional outcome.   REHAB POTENTIAL: Good  CLINICAL DECISION MAKING: Stable/uncomplicated  EVALUATION COMPLEXITY: Low  GOALS: Goals reviewed with patient? Yes  SHORT TERM GOALS=LONG TERM GOALS: Target date:  07/12/2022  Pt will be independent in self MLD to decrease breast swelling Baseline: Goal status: INITIAL  2.  Pt will have breast complaints survey no greater than 20% to demonstrate improved swelling and decreased pain Baseline:  Goal status: INITIAL  3.  Pt will report breast swelling improved by atleast 35-50% Baseline:  Goal status: INITIAL  4.  Pt will improve bilateral shoulder AROM to 150 degrees for improved reaching Baseline:  Goal status: INITIAL  5.  Pt will improve bilateral shoulder abduction to 167 degrees for improved ability to work overhead Baseline:  Goal status: INITIAL  6.  Quick dash will improve to no greater than 20% to demonstrate improved function Baseline:  Goal status: INITIAL  7.  Pt will be compliant with appropriate compression bra to  decrease swelling  Goal Status:INITIAL   PLAN:  PT FREQUENCY: 2x/week  PT DURATION: 6 weeks  PLANNED INTERVENTIONS: therapeutic Exercises, Manual Therapy, Self Care, Dry Needling  PLAN FOR NEXT SESSION: Did pt get compression bra? Measure arm circumferences,STM bilateral UT, Pectorals, lateral trunk, MFR right UE cording, AAROM exs to improve ROM,, MLD to right greater than left breast and instruct pt.  Eula Jaster J Laterica Matarazzo, PT 06/21/2023, 4:00 PM

## 2023-06-28 ENCOUNTER — Other Ambulatory Visit: Payer: Self-pay

## 2023-06-28 ENCOUNTER — Ambulatory Visit: Payer: 59

## 2023-06-28 ENCOUNTER — Other Ambulatory Visit (HOSPITAL_COMMUNITY): Payer: Self-pay

## 2023-06-28 ENCOUNTER — Telehealth: Payer: Self-pay

## 2023-06-28 ENCOUNTER — Encounter (HOSPITAL_COMMUNITY): Payer: Self-pay

## 2023-06-28 ENCOUNTER — Encounter: Payer: Self-pay | Admitting: Hematology and Oncology

## 2023-06-28 ENCOUNTER — Other Ambulatory Visit: Payer: Self-pay | Admitting: Nurse Practitioner

## 2023-06-28 DIAGNOSIS — C7951 Secondary malignant neoplasm of bone: Secondary | ICD-10-CM

## 2023-06-28 DIAGNOSIS — Z170 Estrogen receptor positive status [ER+]: Secondary | ICD-10-CM

## 2023-06-28 DIAGNOSIS — Z515 Encounter for palliative care: Secondary | ICD-10-CM

## 2023-06-28 DIAGNOSIS — G893 Neoplasm related pain (acute) (chronic): Secondary | ICD-10-CM

## 2023-06-28 MED ORDER — OXYCODONE-ACETAMINOPHEN 7.5-325 MG PO TABS
1.0000 | ORAL_TABLET | Freq: Four times a day (QID) | ORAL | 0 refills | Status: DC | PRN
  Filled 2023-06-28: qty 102, 13d supply, fill #0

## 2023-06-28 MED ORDER — XTAMPZA ER 9 MG PO C12A
9.0000 mg | EXTENDED_RELEASE_CAPSULE | Freq: Two times a day (BID) | ORAL | 0 refills | Status: DC
  Filled 2023-06-28 – 2023-07-12 (×3): qty 60, 30d supply, fill #0

## 2023-06-28 NOTE — Telephone Encounter (Signed)
 Called pt. And woke her up. She slept through her appt by accident. She will see me for next appt. On Thursday. Advised to call if she can not make it.

## 2023-06-28 NOTE — Progress Notes (Signed)
 Palliative Medicine [facility]  Telephone:([phone] [phone]) [phone]   Name: INIOLUWA BOULAY Date: 06/28/2023 MRN: [id number]  DOB: [birth year]  Patient Care Team: [name], NP as PCP - General (Internal Medicine) [name], DO as PCP - Cardiology (Cardiology) [name], MD as Consulting Physician (Radiation Oncology) [name], MD as Consulting Physician (General Surgery) [name], MD as Consulting Physician (Hematology and Oncology) [name], [name], NP as Nurse Practitioner (Nurse Practitioner)   I connected with Shona CHRISTELLA Gainer on 06/28/23 at  8:45 AM EST by phone and verified that I am speaking with the correct person using two identifiers.   I discussed the limitations, risks, security and privacy concerns of performing an evaluation and management service by telemedicine and the availability of in-person appointments. I also discussed with the patient that there may be a patient responsible charge related to this service. The patient expressed understanding and agreed to proceed.   Other persons participating in the visit and their role in the encounter: n/a   Patient's location: home  Provider's location: [facility]   Chief Complaint: follow up of symptom management    INTERVAL HISTORY: LEVETA WAHAB is a 53 y.o. 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  MRI of L-spine (12/23) showed L3 pathologic fracture and significant lesion along S1 with sclerotic changes.  Palliative ask to see for symptom management and goals of care.   SOCIAL HISTORY:     reports that she has never smoked. She has never used smokeless  tobacco. She reports that she does not currently use alcohol  after a past usage of about 7.0 standard drinks of alcohol  per week. She reports that she does not use drugs.  ADVANCE DIRECTIVES:   CODE STATUS:   PAST MEDICAL HISTORY: Past Medical History:  Diagnosis Date   Allergy    seasonal allergies   Anxiety    on meds   Asthma    uses inhaler   Breast cancer (HCC) 2012   RIGHT lumpectomy   Cancer (HCC) 2022   RIGHT breast lump-dx 2022   Depression    on meds   DVT (deep venous thrombosis) (HCC) 2010   after hysterectomy   Family history of uterine cancer    GERD (gastroesophageal reflux disease)    with certain foods/OTC PRN meds   Headache(784.0)    History of radiation therapy    Bilateral breast- 09/03/21-11/02/21- Dr. Agent Shannon   Hyperlipidemia    on meds   Hypertension    on meds   SVT (supraventricular tachycardia) (HCC)     ALLERGIES:  has no known allergies.  MEDICATIONS:  Current Outpatient Medications  Medication Sig Dispense Refill   abemaciclib  (VERZENIO ) 50 MG tablet Take 1 tablet (50 mg total) by mouth 2 (two) times daily. 56 tablet 3   albuterol  (VENTOLIN  HFA) 108 (90 Base) MCG/ACT inhaler Inhale 2 puffs by mouth into the lungs every 6 hours as needed for wheezing or shortness of breath. 18 g 2   amLODipine  (NORVASC ) 5 MG tablet Take 1 tablet (5 mg total) by mouth daily. 90 tablet 0   anastrozole  (ARIMIDEX ) 1 MG tablet Take 1 tablet (1 mg total) by mouth daily. 90 tablet 3   calcium -vitamin D  (OSCAL  WITH D) 500-5 MG-MCG tablet Take 1 tablet by mouth daily with supper. 90 tablet 2   gabapentin  (NEURONTIN ) 300 MG capsule Take 1 capsule (300 mg total) by mouth 3 (three) times daily. 90 capsule 6   montelukast  (SINGULAIR ) 10 MG tablet Take 1 tablet (10 mg total) by mouth at bedtime. 90 tablet 3   ondansetron  (ZOFRAN ) 4 MG tablet Take 1 tablet (4 mg total) by mouth every 8 (eight) hours. 90 tablet 11   oxyCODONE  ER 9 MG C12A Take 1 capsule by mouth 2  (two) times daily. 60 capsule 0   oxyCODONE -acetaminophen  (PERCOCET) 7.5-325 MG tablet Take 1 - 2 tablets by mouth every 6 (six) hours as needed for severe pain. 102 tablet 0   pantoprazole  (PROTONIX ) 40 MG tablet Take 1 tablet (40 mg total) by mouth daily. 30 tablet 3   polyethylene glycol (MIRALAX  / GLYCOLAX ) 17 g packet Take 17 g by mouth 2 (two) times daily. 60 each 3   prochlorperazine  (COMPAZINE ) 10 MG tablet Take 1 tablet (10 mg total) by mouth every 6 (six) hours as needed for nausea or vomiting. 30 tablet 0   propranolol  (INDERAL ) 10 MG tablet Take 1 tablet (10 mg total) by mouth 2 (two) times daily. Please call [phone] to schedule an appointment for future refills. Thank you. 3rd attempt. 30 tablet 0   senna (SENOKOT) 8.6 MG TABS tablet Take 2 tablets (17.2 mg total) by mouth at bedtime. 60 tablet 3   No current facility-administered medications for this visit.    VITAL SIGNS: There were no vitals taken for this visit. There were no vitals filed for this visit.   Estimated body mass index is 28.13 kg/m as calculated from the following:   Height as of 05/26/23: 5' 11.65 (1.82 m).   Weight as of 05/26/23: 205 lb 6.4 oz (93.2 kg).   PERFORMANCE STATUS (ECOG) : 1 - Symptomatic but completely ambulatory   IMPRESSION: I connected by phone with Ms. Kirchman for follow-up. No acute distress. She is doing well overall. Denies vomiting, constipation, diarrhea. Some fatigue. Is remaining as active as possible. Shares she is scheduled for biopsy in the upcoming weeks due to recent findings of enlarged lymph nodes.   Neoplasm related pain Lacy reports pain is controlled on current regimen. Some days continue to be better than others.   We discussed at length her current regimen.  She is taking Xtampza  9 mg every 12 hours and Percocet every 6 hours as needed for breakthrough pain.  Tolerating medications without difficulty. Taking as prescribed. Refills have been appropriate timing based  on pill count. No adjustments to be made today.  We will continue to closely monitor. Reminded patient of refill policy in regards to adhering to scheduled appointments.   Of note patient's insurance has denied ongoing coverage of Xtampza . This was discussed with patient. Advised we will continue to focus on her pain and adjust regimen accordingly.   Pain contract on file.  Constipation Improved with bowel regimen.   PLAN: Continue Xtampza  9 mg every 12 hours.    Continue Percocet every 6 hours as needed for breakthrough pain. Not requiring around the clock.  MiraLAX  twice daily for bowel regimen Senna daily Protonix  40 mg daily  Patient reports pain is well-controlled. I will plan to see patient back in 4-6 weeks in collaboration with her other oncology appointments. Patient knows to contact our office sooner if needed.   Patient expressed understanding and was in agreement with this plan.  She also understands that She can call the clinic at any time with any questions, concerns, or complaints.     Any controlled substances utilized were prescribed in the context of palliative care. PDMP has been reviewed.    Visit consisted of counseling and education dealing with the complex and emotionally intense issues of symptom management and palliative care in the setting of serious and potentially life-threatening illness.  Levon Borer, AGPCNP-BC  Palliative Medicine Team/[facility]

## 2023-06-29 ENCOUNTER — Encounter: Payer: Self-pay | Admitting: Hematology and Oncology

## 2023-06-29 ENCOUNTER — Other Ambulatory Visit: Payer: Self-pay

## 2023-06-29 ENCOUNTER — Other Ambulatory Visit (HOSPITAL_COMMUNITY): Payer: Self-pay

## 2023-06-30 ENCOUNTER — Inpatient Hospital Stay: Payer: 59 | Attending: Adult Health | Admitting: Nurse Practitioner

## 2023-06-30 ENCOUNTER — Ambulatory Visit: Payer: 59 | Attending: Hematology and Oncology

## 2023-06-30 DIAGNOSIS — R293 Abnormal posture: Secondary | ICD-10-CM | POA: Insufficient documentation

## 2023-06-30 DIAGNOSIS — Z170 Estrogen receptor positive status [ER+]: Secondary | ICD-10-CM | POA: Diagnosis not present

## 2023-06-30 DIAGNOSIS — R110 Nausea: Secondary | ICD-10-CM | POA: Insufficient documentation

## 2023-06-30 DIAGNOSIS — M25611 Stiffness of right shoulder, not elsewhere classified: Secondary | ICD-10-CM | POA: Insufficient documentation

## 2023-06-30 DIAGNOSIS — I890 Lymphedema, not elsewhere classified: Secondary | ICD-10-CM | POA: Insufficient documentation

## 2023-06-30 DIAGNOSIS — C50411 Malignant neoplasm of upper-outer quadrant of right female breast: Secondary | ICD-10-CM | POA: Insufficient documentation

## 2023-06-30 DIAGNOSIS — C50412 Malignant neoplasm of upper-outer quadrant of left female breast: Secondary | ICD-10-CM | POA: Diagnosis not present

## 2023-06-30 DIAGNOSIS — C7951 Secondary malignant neoplasm of bone: Secondary | ICD-10-CM | POA: Insufficient documentation

## 2023-06-30 DIAGNOSIS — Z515 Encounter for palliative care: Secondary | ICD-10-CM | POA: Diagnosis not present

## 2023-06-30 DIAGNOSIS — Z79811 Long term (current) use of aromatase inhibitors: Secondary | ICD-10-CM | POA: Insufficient documentation

## 2023-06-30 DIAGNOSIS — C773 Secondary and unspecified malignant neoplasm of axilla and upper limb lymph nodes: Secondary | ICD-10-CM | POA: Insufficient documentation

## 2023-06-30 DIAGNOSIS — R530 Neoplastic (malignant) related fatigue: Secondary | ICD-10-CM | POA: Diagnosis not present

## 2023-06-30 DIAGNOSIS — G893 Neoplasm related pain (acute) (chronic): Secondary | ICD-10-CM

## 2023-06-30 DIAGNOSIS — M25612 Stiffness of left shoulder, not elsewhere classified: Secondary | ICD-10-CM | POA: Insufficient documentation

## 2023-06-30 NOTE — Therapy (Signed)
 OUTPATIENT PHYSICAL THERAPY  UPPER EXTREMITY ONCOLOGY EVALUATION  Patient Name: Chelsea Hansen MRN: [id number] DOB:1971/06/10, 53 y.o., female Today's Date: 06/30/2023  END OF SESSION:  PT End of Session - 06/30/23 1403     Visit Number 4    Number of Visits 12    Date for PT Re-Evaluation 07/13/23    PT Start Time 1403    PT Stop Time 1453    PT Time Calculation (min) 50 min    Activity Tolerance Patient tolerated treatment well    Behavior During Therapy [facility] for tasks assessed/performed             Past Medical History:  Diagnosis Date   Allergy    seasonal allergies   Anxiety    on meds   Asthma    uses inhaler   Breast cancer (HCC) 2012   RIGHT lumpectomy   Cancer (HCC) 2022   RIGHT breast lump-dx 2022   Depression    on meds   DVT (deep venous thrombosis) (HCC) 2010   after hysterectomy   Family history of uterine cancer    GERD (gastroesophageal reflux disease)    with certain foods/OTC PRN meds   Headache(784.0)    History of radiation therapy    Bilateral breast- 09/03/21-11/02/21- Dr. Lynwood Nasuti   Hyperlipidemia    on meds   Hypertension    on meds   SVT (supraventricular tachycardia) (HCC)    Past Surgical History:  Procedure Laterality Date   ABDOMINAL HYSTERECTOMY     AXILLARY SENTINEL NODE BIOPSY Left 07/09/2021   Procedure: LEFT AXILLARY SENTINEL NODE BIOPSY;  Surgeon: [name], MD;  Location: [facility];  Service: General;  Laterality: Left;   BIOPSY  06/29/2022   Procedure: BIOPSY;  Surgeon: Legrand Victory LITTIE DOUGLAS, MD;  Location: [facility];  Service: Gastroenterology;;   BREAST EXCISIONAL BIOPSY Right 09/16/2009   BREAST LUMPECTOMY WITH RADIOACTIVE SEED LOCALIZATION Bilateral 07/09/2021   Procedure: BILATERAL BREAST LUMPECTOMY WITH RADIOACTIVE SEED LOCALIZATION;  Surgeon: [name], MD;  Location: [facility];  Service: General;  Laterality: Bilateral;   BREAST SURGERY     lumpectomy    ESOPHAGOGASTRODUODENOSCOPY (EGD) WITH PROPOFOL  N/A 06/29/2022   Procedure: ESOPHAGOGASTRODUODENOSCOPY (EGD) WITH PROPOFOL ;  Surgeon: Legrand Victory LITTIE DOUGLAS, MD;  Location: [facility];  Service: Gastroenterology;  Laterality: N/A;   PORT-A-CATH REMOVAL Left 07/09/2021   Procedure: REMOVAL PORT-A-CATH;  Surgeon: [name], MD;  Location: [facility];  Service: General;  Laterality: Left;   PORTACATH PLACEMENT Left 01/26/2021   Procedure: INSERTION PORT-A-CATH;  Surgeon: [name], MD;  Location: [facility];  Service: General;  Laterality: Left;   RADIOACTIVE SEED GUIDED AXILLARY SENTINEL LYMPH NODE Right 07/09/2021   Procedure: RADIOACTIVE SEED GUIDED RIGHT AXILLARY SENTINEL LYMPH NODE DISSECTION;  Surgeon: [name], MD;  Location:  SURGERY CENTER;  Service: General;  Laterality: Right;   TUBAL LIGATION     Patient Active Problem List   Diagnosis Date Noted   Neoplasm related pain 12/07/2022   Chronic constipation 06/30/2022   Nausea and vomiting in adult 06/29/2022   Hematemesis 06/28/2022   Metastasis to bone (HCC) 05/20/2022   PSVT (paroxysmal supraventricular tachycardia) (HCC) 07/08/2021   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   GERD (gastroesophageal reflux disease) 05/27/2021   Hyperlipidemia 05/27/2021   Hypertension 05/27/2021   Pain management contract signed 05/04/2021   Chronic pain syndrome (breast cancer) 05/04/2021   Palpitations 03/25/2021   Sinus bradycardia 03/25/2021  Daytime somnolence 03/25/2021   Snoring 03/25/2021   Obesity (BMI 30-39.9) 03/25/2021   Port-A-Cath in place 01/27/2021   Genetic testing 12/24/2020   Family history of uterine cancer 12/17/2020   Malignant neoplasm of upper-outer quadrant of right breast in female, estrogen receptor positive (HCC) 12/12/2020   Malignant neoplasm of upper-outer quadrant of left breast in female, estrogen receptor positive (HCC) 12/12/2020   Hypokalemia 01/02/2012   Asthma  01/01/2012   Bradycardia 01/01/2012   Syncope 12/31/2011   Mediastinal adenopathy 12/31/2011   Anemia 12/31/2011   Chest pain 12/31/2011       REFERRING PROVIDER: Mackey Chad, MD  REFERRING DIAG: Lymphedema  THERAPY DIAG:  Malignant neoplasm of upper-outer quadrant of right breast in female, estrogen receptor positive (HCC)  Lymphedema, not elsewhere classified  Abnormal posture  Malignant neoplasm of upper-outer quadrant of left breast in female, estrogen receptor positive (HCC)  ONSET DATE: 1 month ago  Rationale for Evaluation and Treatment: Rehabilitation  SUBJECTIVE:                                                                                                                                                                                           SUBJECTIVE STATEMENT:   They have added me more days on the schedule and I can't do that. I have the biopsy next Wednesday. Swelling is up and down but not too bad. I feel really stiff in my shoulders today   EVAL Bilateral axillary regions started swelling again,  and right breast greater than left breast, and both of my chest muscles  are hurting again. I started back to wearing my sports bra. I have been using my Flexi touch every day. For a little while the swelling feels better, but it always comes back. I need a new compression bra. I have been working a lot, and I took myself off of night shift. I feel like work contributed to my swelling.  PERTINENT HISTORY:  Neoadjuvant chemotherapy with Doxorubicin  and Cyclophosphamide  given x 4 beginning 01/27/2021 and completing on 03/10/2021 followed by weekly paclitaxel  x 12 beginning 05/2021 07/09/2021: Right lumpectomy: Grade 2 invasive lobular cancer, 2.5 cm, LCIS, margins negative, LCIS focally at anterior margin, 1/1 lymph node positive, ER 95%, PR 100%, HER2 negative, Ki-67 40% Left lumpectomy: High-grade DCIS: 0.7 cm, margins negative, 0/1 lymph node negative ER 95%, PR  100%. Pt now  STAGE IV Breast Cancer with bone mets throughout thoracic and lumbar regions and is s/p radiation to her lumbar spine. Pt. sees Palliative Care for chronic pain management.    PAIN:  Are you having pain? Yes NPRS scale: 3/10 right greater  than left axilla Pain location: bilateral lateral trunk Pain orientation: Bilateral  PAIN TYPE: aching, sharp, and tight Pain description: intermittent  Aggravating factors: a lot of lifting/work activities,grocery shopping Relieving factors: Rest, Flexi touch,sports bra  PRECAUTIONS: Stage IV breast Cancer with bone METS, bilateral Breast lymphedema  RED FLAGS: Compression fracture: Yes: last year from bone METS    WEIGHT BEARING RESTRICTIONS: No  FALLS:  Has patient fallen in last 6 months? No  LIVING ENVIRONMENT: Lives with: lives with their family and lives alone Lives in: Other hotel Stairs: Yes; Internal: 15 steps; on left going up Has following equipment at home: NA  OCCUPATION: shift mgr for [employer]  LEISURE: shop, dominoes  HAND DOMINANCE: right   PRIOR LEVEL OF FUNCTION: Independent  PATIENT GOALS: Reduce swelling, reduce pain   OBJECTIVE: Note: Objective measures were completed at Evaluation unless otherwise noted.  COGNITION: Overall cognitive status: Within functional limits for tasks assessed   PALPATION: Very tender bilateral UT, pectorals, axilla and lateral trunk, medial and lateral breasts, Right medial upper arm  OBSERVATIONS / OTHER ASSESSMENTS: Slight cording noted in right axilla to medial upper arm, very sensitive to light touch,Fibrosis noted bilateral breasts medial and lateral, Right axilla with increased swelling, mild peau d'orange right inferior breast with some enlarged pores bilaterally  SENSATION: Light touch: Deficits     POSTURE: Forward head, rounded shoulders  UPPER EXTREMITY AROM/PROM:  A/PROM RIGHT   eval   Shoulder extension 40, tight axilla  Shoulder flexion 140  +axilla  Shoulder abduction 147,+axilla  Shoulder internal rotation 35 +  Shoulder external rotation 85+    (Blank rows = not tested)  A/PROM LEFT   eval  Shoulder extension 47  Shoulder flexion 137 axilla  Shoulder abduction 146, axilla  Shoulder internal rotation Pain shoulder;held  Shoulder external rotation     (Blank rows = not tested)  CERVICAL AROM: All within functional limits:      UPPER EXTREMITY STRENGTH:   LYMPHEDEMA ASSESSMENTS:   SURGERY TYPE/DATE: 07/09/2021 BILATERAL BREAST LUMPECTOMY WITH RADIOACTIVE SEED LOCALIZATION RADIOACTIVE SEED GUIDED RIGHT AXILLARY TARGETED LYMPH NODE DISSECTION LEFT AXILLARY SENTINEL NODE BIOPSY LEFT AXILLARY LYMPH NODE MAPPING WITH SENTIMAG  NUMBER OF LYMPH NODES REMOVED: Right 1/1, Left 0/1   CHEMOTHERAPY: Neoadjuvant  RADIATION:Bilateral Breast and lumbar spine  HORMONE TREATMENT: YES  INFECTIONS: NO   LYMPHEDEMA ASSESSMENTS:   LANDMARK RIGHT  eval  At axilla    15 cm proximal to olecranon process   10 cm proximal to olecranon process   Olecranon process   15 cm proximal to ulnar styloid process   10 cm proximal to ulnar styloid process   Just proximal to ulnar styloid process   Across hand at thumb web space   At base of 2nd digit   (Blank rows = not tested)  LANDMARK LEFT  eval  At axilla    15 cm proximal to olecranon process   10 cm proximal to olecranon process   Olecranon process   15 cm proximal to ulnar styloid process   10 cm proximal to ulnar styloid process   Just proximal to ulnar styloid process   Across hand at thumb web space   At base of 2nd digit   (Blank rows = not tested)   FUNCTIONAL TESTS:    GAIT: Distance walked: front office to treatment room 8 Assistive device utilized: None Level of assistance: Complete Independence Comments: WNL   QUICK DASH SURVEY: 55%  BREAST COMPLAINTS QUESTIONNAIRE Pain:7 Heaviness:7 Swollen feeling:10  Tense Skin:10 Redness:8 Bra  Print:10 Size of Pores:10 Hard feeling: 10 Total:   72  /80 A Score over 9 indicates lymphedema issues in the breast    TODAY'S TREATMENT:                                                                                                                                          DATE:   06/29/2022 Wall Arc x 4 B Bilateral single arm pec stretch x 3 Pulleys flexion and abduction x 2:30 ea Ball rolls forward x 10 and lateral x 5 B Supine snow angels 5 Supine wand flex and scaption x 3 PROM bilateral shoulder flex, scaption, abduction, ER 06/21/2023 Wall arc x 3 B to stretch lat trunk on opposite side. Supine wand exs x 3 flexion and scaption STM bilateral UT/pectorals, Lateral trunk with cocoa butter with multiple shortened areas especially laterally PROM bilateral shoulder flexion, scaption, abd, ER with VC's prn to relax Supine horizontal abd bilaterally x 5, scaption x 5  06/07/2023  Pulleys x 2 min flexion and abduction STM bilateral UT/pectorals, Lateral trunk with cocoa butter with multiple shortened areas especially laterally Supine wand flexion and scaption x 4 PROM bilateral shoulder flexion, scaption, abd, ER with VC's prn to relax Tried standing counter stretch but held secondary to LBP, Wall arc x 3 B Updated HEP with 4 post op exercises to perform 2x's per day, or may do supine wand in place of clasped hands flexion.  06/01/2023 Discussed treatment plan and interventions with pt including STM to decrease muscular tightness, stretching exercises for chest/shoulder to decrease muscle tightness, importance of proper compression bra (Gave pt new script), and return to MLD, CDT. Pt reviewed stargazer stretch and was reminded to start back to Abduction wall slides. Pt is not wearing a proper sports bra or compression bra but will start back to one.    PATIENT EDUCATION:  Education details: Pt reminded about importance of compression, MLD and Flexitouch use and its importance  to decrease breast swelling, gentle stretching of pecs and shoulder abduction Person educated: Patient Education method: Explanation Education comprehension: verbalized understanding and returned demonstration  HOME EXERCISE PROGRAM: Resume stargazer and wall slides for abd 2x/day 5 repetitions  ASSESSMENT:  CLINICAL IMPRESSION: Pt was feeling very tight in her shoulders today so treatment focused on bilateral shoulder ROM. But was stiff throughout but relaxed well for PROM and ROM was visibly improved after treatment. Pt is pending multiple biopsies next Wednesday  OBJECTIVE IMPAIRMENTS: decreased activity tolerance, decreased ROM, increased edema, impaired flexibility, impaired UE functional use, postural dysfunction, and pain.   ACTIVITY LIMITATIONS: carrying, lifting, sleeping, and reach over head  PARTICIPATION LIMITATIONS: cleaning and occupation  PERSONAL FACTORS: 3+ comorbidities: Bilateral breast cancer s/p surgery, chemo and radiation, now Stage IV Metastatic Breast Cancer with multiple bone METS in thoraacic and lumbar spine  are also affecting patient's functional  outcome.   REHAB POTENTIAL: Good  CLINICAL DECISION MAKING: Stable/uncomplicated  EVALUATION COMPLEXITY: Low  GOALS: Goals reviewed with patient? Yes  SHORT TERM GOALS=LONG TERM GOALS: Target date: 07/12/2022  Pt will be independent in self MLD to decrease breast swelling Baseline: Goal status: INITIAL  2.  Pt will have breast complaints survey no greater than 20% to demonstrate improved swelling and decreased pain Baseline:  Goal status: INITIAL  3.  Pt will report breast swelling improved by atleast 35-50% Baseline:  Goal status: INITIAL  4.  Pt will improve bilateral shoulder AROM to 150 degrees for improved reaching Baseline:  Goal status: INITIAL  5.  Pt will improve bilateral shoulder abduction to 167 degrees for improved ability to work overhead Baseline:  Goal status: INITIAL  6.  Quick  dash will improve to no greater than 20% to demonstrate improved function Baseline:  Goal status: INITIAL  7.  Pt will be compliant with appropriate compression bra to decrease swelling  Goal Status:INITIAL   PLAN:  PT FREQUENCY: 2x/week  PT DURATION: 6 weeks  PLANNED INTERVENTIONS: therapeutic Exercises, Manual Therapy, Self Care, Dry Needling  PLAN FOR NEXT SESSION: Did pt get compression bra? Measure arm circumferences,STM bilateral UT, Pectorals, lateral trunk, MFR right UE cording, AAROM exs to improve ROM,, MLD to right greater than left breast and instruct pt.  Grayce JINNY Sheldon, PT 06/30/2023, 2:54 PM

## 2023-07-01 ENCOUNTER — Other Ambulatory Visit (HOSPITAL_COMMUNITY): Payer: Self-pay

## 2023-07-01 ENCOUNTER — Encounter: Payer: Self-pay | Admitting: *Deleted

## 2023-07-02 ENCOUNTER — Encounter: Payer: Self-pay | Admitting: Nurse Practitioner

## 2023-07-02 ENCOUNTER — Encounter: Payer: Self-pay | Admitting: Hematology and Oncology

## 2023-07-04 ENCOUNTER — Other Ambulatory Visit (HOSPITAL_COMMUNITY): Payer: Self-pay

## 2023-07-04 ENCOUNTER — Other Ambulatory Visit (HOSPITAL_COMMUNITY): Payer: Self-pay | Admitting: Radiology

## 2023-07-04 ENCOUNTER — Encounter: Payer: Self-pay | Admitting: Hematology and Oncology

## 2023-07-04 DIAGNOSIS — K118 Other diseases of salivary glands: Secondary | ICD-10-CM

## 2023-07-05 ENCOUNTER — Telehealth: Payer: Self-pay

## 2023-07-05 ENCOUNTER — Other Ambulatory Visit (HOSPITAL_COMMUNITY): Payer: Self-pay | Admitting: Radiology

## 2023-07-05 NOTE — Telephone Encounter (Signed)
 Tried calling pt re; missed appt. Left message. Pt is undergoing further testing tomorrow.

## 2023-07-06 ENCOUNTER — Ambulatory Visit (HOSPITAL_COMMUNITY): Payer: 59

## 2023-07-06 ENCOUNTER — Ambulatory Visit (HOSPITAL_COMMUNITY)
Admission: RE | Admit: 2023-07-06 | Discharge: 2023-07-06 | Disposition: A | Discharge status: Home / Self Care | Payer: 59 | Source: Ambulatory Visit | Attending: Hematology and Oncology | Admitting: Hematology and Oncology

## 2023-07-06 ENCOUNTER — Other Ambulatory Visit (HOSPITAL_COMMUNITY): Payer: Self-pay | Admitting: Hematology and Oncology

## 2023-07-06 DIAGNOSIS — R590 Localized enlarged lymph nodes: Secondary | ICD-10-CM | POA: Diagnosis not present

## 2023-07-06 DIAGNOSIS — C7951 Secondary malignant neoplasm of bone: Secondary | ICD-10-CM | POA: Insufficient documentation

## 2023-07-06 DIAGNOSIS — C801 Malignant (primary) neoplasm, unspecified: Secondary | ICD-10-CM | POA: Diagnosis not present

## 2023-07-06 DIAGNOSIS — R599 Enlarged lymph nodes, unspecified: Secondary | ICD-10-CM | POA: Diagnosis not present

## 2023-07-06 DIAGNOSIS — Z853 Personal history of malignant neoplasm of breast: Secondary | ICD-10-CM | POA: Insufficient documentation

## 2023-07-06 DIAGNOSIS — Z170 Estrogen receptor positive status [ER+]: Secondary | ICD-10-CM

## 2023-07-06 DIAGNOSIS — C50919 Malignant neoplasm of unspecified site of unspecified female breast: Secondary | ICD-10-CM | POA: Diagnosis not present

## 2023-07-06 DIAGNOSIS — C7981 Secondary malignant neoplasm of breast: Secondary | ICD-10-CM | POA: Diagnosis not present

## 2023-07-06 MED ORDER — LIDOCAINE HCL 1 % IJ SOLN
INTRAMUSCULAR | Status: AC
  Filled 2023-07-06: qty 20

## 2023-07-06 NOTE — Procedures (Signed)
 Vascular and Interventional Radiology Procedure Note  Patient: Chelsea Hansen DOB: 04-Aug-1970 Medical Record Number: [id number] Note Date/Time: 07/06/23 1:28 PM   Performing Physician: Art Largo, MD Assistant(s): None  Diagnosis: Metastatic breast CA w enlarged L submandibular LN  Procedure: LEFT SUBMANDIBULAR LYMPH NODE BIOPSY  Anesthesia: Local Anesthetic Complications: None Estimated Blood Loss: Minimal Specimens: Sent for Pathology  Findings:  Successful Ultrasound-guided biopsy of L submandibular LN. A total of 4 samples were obtained. Hemostasis of the tract was achieved using Manual Pressure.  Plan: Bed rest for 0 hours.  See detailed procedure note with images in PACS. The patient tolerated the procedure well without incident or complication and was returned to Recovery in stable condition.    Art Largo, MD Vascular and Interventional Radiology [facility]   Pager. [phone] Clinic. [phone]

## 2023-07-07 ENCOUNTER — Other Ambulatory Visit: Payer: Self-pay

## 2023-07-08 LAB — SURGICAL PATHOLOGY

## 2023-07-11 ENCOUNTER — Other Ambulatory Visit: Payer: Self-pay

## 2023-07-11 NOTE — Progress Notes (Signed)
Specialty Pharmacy Refill Coordination Note  Chelsea Hansen is a 53 y.o. female contacted today regarding refills of specialty medication(s) Abemaciclib [name])   Patient requested (Patient-Rptd) Pickup at [facility] at [place] date: 07/12/23   Medication will be filled on 01.21.25.

## 2023-07-12 ENCOUNTER — Other Ambulatory Visit: Payer: Self-pay | Admitting: Nurse Practitioner

## 2023-07-12 ENCOUNTER — Other Ambulatory Visit (HOSPITAL_COMMUNITY): Payer: Self-pay

## 2023-07-12 ENCOUNTER — Ambulatory Visit: Payer: 59

## 2023-07-12 ENCOUNTER — Other Ambulatory Visit: Payer: Self-pay

## 2023-07-12 DIAGNOSIS — C50411 Malignant neoplasm of upper-outer quadrant of right female breast: Secondary | ICD-10-CM | POA: Diagnosis not present

## 2023-07-12 DIAGNOSIS — C50412 Malignant neoplasm of upper-outer quadrant of left female breast: Secondary | ICD-10-CM | POA: Diagnosis not present

## 2023-07-12 DIAGNOSIS — I890 Lymphedema, not elsewhere classified: Secondary | ICD-10-CM

## 2023-07-12 DIAGNOSIS — Z515 Encounter for palliative care: Secondary | ICD-10-CM

## 2023-07-12 DIAGNOSIS — M25611 Stiffness of right shoulder, not elsewhere classified: Secondary | ICD-10-CM

## 2023-07-12 DIAGNOSIS — Z170 Estrogen receptor positive status [ER+]: Secondary | ICD-10-CM

## 2023-07-12 DIAGNOSIS — M25612 Stiffness of left shoulder, not elsewhere classified: Secondary | ICD-10-CM

## 2023-07-12 DIAGNOSIS — R293 Abnormal posture: Secondary | ICD-10-CM | POA: Diagnosis not present

## 2023-07-12 DIAGNOSIS — C7951 Secondary malignant neoplasm of bone: Secondary | ICD-10-CM

## 2023-07-12 DIAGNOSIS — G893 Neoplasm related pain (acute) (chronic): Secondary | ICD-10-CM

## 2023-07-12 NOTE — Progress Notes (Signed)
Per Duncan Dull, [employer] did not come in today due to weather issues. Spoke with patient and she has a few days at least remaining so we can call her when it comes in for pickup.

## 2023-07-12 NOTE — Therapy (Signed)
OUTPATIENT PHYSICAL THERAPY  UPPER EXTREMITY ONCOLOGY EVALUATION  Patient Name: Chelsea Hansen MRN: [id number] DOB:[birth year], 53 y.o., female Today's Date: 07/12/2023  END OF SESSION:  PT End of Session - 07/12/23 1503     Visit Number 5    Number of Visits 13    Date for PT Re-Evaluation 08/09/23    PT Start Time 1504    PT Stop Time 1600    PT Time Calculation (min) 56 min    Activity Tolerance Patient tolerated treatment well    Behavior During Therapy [facility] for tasks assessed/performed             Past Medical History:  Diagnosis Date   Allergy    seasonal allergies   Anxiety    on meds   Asthma    uses inhaler   Breast cancer (HCC) 2012   RIGHT lumpectomy   Cancer (HCC) 2022   RIGHT breast lump-dx 2022   Depression    on meds   DVT (deep venous thrombosis) (HCC) 2010   after hysterectomy   Family history of uterine cancer    GERD (gastroesophageal reflux disease)    with certain foods/OTC PRN meds   Headache(784.0)    History of radiation therapy    Bilateral breast- 09/03/21-11/02/21- Dr. Antony Blackbird   Hyperlipidemia    on meds   Hypertension    on meds   SVT (supraventricular tachycardia) (HCC)    Past Surgical History:  Procedure Laterality Date   ABDOMINAL HYSTERECTOMY     AXILLARY SENTINEL NODE BIOPSY Left 07/09/2021   Procedure: LEFT AXILLARY SENTINEL NODE BIOPSY;  Surgeon: Abigail Miyamoto, MD;  Location: [facility];  Service: General;  Laterality: Left;   BIOPSY  06/29/2022   Procedure: BIOPSY;  Surgeon: Sherrilyn Rist, MD;  Location: [facility];  Service: Gastroenterology;;   BREAST EXCISIONAL BIOPSY Right 09/16/2009   BREAST LUMPECTOMY WITH RADIOACTIVE SEED LOCALIZATION Bilateral 07/09/2021   Procedure: BILATERAL BREAST LUMPECTOMY WITH RADIOACTIVE SEED LOCALIZATION;  Surgeon: Abigail Miyamoto, MD;  Location: [facility];  Service: General;  Laterality: Bilateral;   BREAST SURGERY     lumpectomy    ESOPHAGOGASTRODUODENOSCOPY (EGD) WITH PROPOFOL N/A 06/29/2022   Procedure: ESOPHAGOGASTRODUODENOSCOPY (EGD) WITH PROPOFOL;  Surgeon: Sherrilyn Rist, MD;  Location: [facility];  Service: Gastroenterology;  Laterality: N/A;   PORT-A-CATH REMOVAL Left 07/09/2021   Procedure: REMOVAL PORT-A-CATH;  Surgeon: Abigail Miyamoto, MD;  Location: [facility];  Service: General;  Laterality: Left;   PORTACATH PLACEMENT Left 01/26/2021   Procedure: INSERTION PORT-A-CATH;  Surgeon: Abigail Miyamoto, MD;  Location: [facility];  Service: General;  Laterality: Left;   RADIOACTIVE SEED GUIDED AXILLARY SENTINEL LYMPH NODE Right 07/09/2021   Procedure: RADIOACTIVE SEED GUIDED RIGHT AXILLARY SENTINEL LYMPH NODE DISSECTION;  Surgeon: Abigail Miyamoto, MD;  Location: [facility];  Service: General;  Laterality: Right;   TUBAL LIGATION     Patient Active Problem List   Diagnosis Date Noted   Neoplasm related pain 12/07/2022   Chronic constipation 06/30/2022   Nausea and vomiting in adult 06/29/2022   Hematemesis 06/28/2022   Metastasis to bone (HCC) 05/20/2022   PSVT (paroxysmal supraventricular tachycardia) (HCC) 07/08/2021   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   GERD (gastroesophageal reflux disease) 05/27/2021   Hyperlipidemia 05/27/2021   Hypertension 05/27/2021   Pain management contract signed 05/04/2021   Chronic pain syndrome (breast cancer) 05/04/2021   Palpitations 03/25/2021   Sinus bradycardia 03/25/2021  Daytime somnolence 03/25/2021   Snoring 03/25/2021   Obesity (BMI 30-39.9) 03/25/2021   Port-A-Cath in place 01/27/2021   Genetic testing 12/24/2020   Family history of uterine cancer 12/17/2020   Malignant neoplasm of upper-outer quadrant of right breast in female, estrogen receptor positive (HCC) 12/12/2020   Malignant neoplasm of upper-outer quadrant of left breast in female, estrogen receptor positive (HCC) 12/12/2020   Hypokalemia 01/02/2012   Asthma  01/01/2012   Bradycardia 01/01/2012   Syncope 12/31/2011   Mediastinal adenopathy 12/31/2011   Anemia 12/31/2011   Chest pain 12/31/2011       REFERRING PROVIDER: Serena Croissant, MD  REFERRING DIAG: Lymphedema  THERAPY DIAG:  Malignant neoplasm of upper-outer quadrant of right breast in female, estrogen receptor positive (HCC)  Lymphedema, not elsewhere classified  Abnormal posture  Malignant neoplasm of upper-outer quadrant of left breast in female, estrogen receptor positive (HCC)  Stiffness of right shoulder, not elsewhere classified  Stiffness of left shoulder, not elsewhere classified  ONSET DATE: 1 month ago  Rationale for Evaluation and Treatment: Rehabilitation  SUBJECTIVE:                                                                                                                                                                                           SUBJECTIVE STATEMENT:  I had the biopsy. I am waiting to hear about it now.  I am trying to keep up with my exercises. Shoulders/chest still get tight off and on. Right greater than left breast swelling on and off. I am going to get my new compression bra tomorrow. I do the MLD 3x's per week and use my Flexi touch 1x/week.   EVAL Bilateral axillary regions started swelling again,  and right breast greater than left breast, and both of my chest muscles  are hurting again. I started back to wearing my sports bra. I have been using my Flexi touch every day. For a little while the swelling feels better, but it always comes back. I need a new compression bra. I have been working a lot, and I took myself off of night shift. I feel like work contributed to my swelling.  PERTINENT HISTORY:  Neoadjuvant chemotherapy with Doxorubicin and Cyclophosphamide given x 4 beginning 01/27/2021 and completing on 03/10/2021 followed by weekly paclitaxel x 12 beginning 05/2021 07/09/2021: Right lumpectomy: Grade 2 invasive lobular cancer,  2.5 cm, LCIS, margins negative, LCIS focally at anterior margin, 1/1 lymph node positive, ER 95%, PR 100%, HER2 negative, Ki-67 40% Left lumpectomy: High-grade DCIS: 0.7 cm, margins negative, 0/1 lymph node negative ER 95%, PR 100%. Pt now  STAGE IV Breast Cancer  with bone mets throughout thoracic and lumbar regions and is s/p radiation to her lumbar spine. Pt. sees Palliative Care for chronic pain management.    PAIN:  Are you having pain? Yes NPRS scale: 7/10 my whole body. I have been running all weekend. Pain location: bilateral lateral trunk, chest arms, legs, back Pain orientation: Bilateral  PAIN TYPE: aching, sharp, and tight Pain description: intermittent  Aggravating factors: a lot of lifting/work activities,grocery shopping Relieving factors: Rest, Flexi touch,sports bra  PRECAUTIONS: Stage IV breast Cancer with bone METS, bilateral Breast lymphedema  RED FLAGS: Compression fracture: Yes: last year from bone METS    WEIGHT BEARING RESTRICTIONS: No  FALLS:  Has patient fallen in last 6 months? No  LIVING ENVIRONMENT: Lives with: lives with their family and lives alone Lives in: Other hotel Stairs: Yes; Internal: 15 steps; on left going up Has following equipment at home: NA  OCCUPATION: shift mgr for [employer]  LEISURE: shop, dominoes  HAND DOMINANCE: right   PRIOR LEVEL OF FUNCTION: Independent  PATIENT GOALS: Reduce swelling, reduce pain   OBJECTIVE: Note: Objective measures were completed at Evaluation unless otherwise noted.  COGNITION: Overall cognitive status: Within functional limits for tasks assessed   PALPATION: Very tender bilateral UT, pectorals, axilla and lateral trunk, medial and lateral breasts, Right medial upper arm  OBSERVATIONS / OTHER ASSESSMENTS: Slight cording noted in right axilla to medial upper arm, very sensitive to light touch,Fibrosis noted bilateral breasts medial and lateral, Right axilla with increased swelling, mild peau  d'orange right inferior breast with some enlarged pores bilaterally  SENSATION: Light touch: Deficits     POSTURE: Forward head, rounded shoulders  UPPER EXTREMITY AROM/PROM:  A/PROM RIGHT   eval  RIGHT 07/12/2023  Shoulder extension 40, tight axilla 52  Shoulder flexion 140 +axilla 150  Shoulder abduction 147,+axilla 168  Shoulder internal rotation 35 + 67  Shoulder external rotation 85+ 95    (Blank rows = not tested)  A/PROM LEFT   eval LEFT 07/12/2023  Shoulder extension 47 54  Shoulder flexion 137 axilla 155  Shoulder abduction 146, axilla 170  Shoulder internal rotation Pain shoulder;held 55  Shoulder external rotation  98    (Blank rows = not tested)  CERVICAL AROM: All within functional limits:      UPPER EXTREMITY STRENGTH:   LYMPHEDEMA ASSESSMENTS:   SURGERY TYPE/DATE: 07/09/2021 BILATERAL BREAST LUMPECTOMY WITH RADIOACTIVE SEED LOCALIZATION RADIOACTIVE SEED GUIDED RIGHT AXILLARY TARGETED LYMPH NODE DISSECTION LEFT AXILLARY SENTINEL NODE BIOPSY LEFT AXILLARY LYMPH NODE MAPPING WITH SENTIMAG  NUMBER OF LYMPH NODES REMOVED: Right 1/1, Left 0/1   CHEMOTHERAPY: Neoadjuvant  RADIATION:Bilateral Breast and lumbar spine  HORMONE TREATMENT: YES  INFECTIONS: NO   LYMPHEDEMA ASSESSMENTS:   LANDMARK RIGHT  eval  At axilla    15 cm proximal to olecranon process   10 cm proximal to olecranon process   Olecranon process   15 cm proximal to ulnar styloid process   10 cm proximal to ulnar styloid process   Just proximal to ulnar styloid process   Across hand at thumb web space   At base of 2nd digit   (Blank rows = not tested)  LANDMARK LEFT  eval  At axilla    15 cm proximal to olecranon process   10 cm proximal to olecranon process   Olecranon process   15 cm proximal to ulnar styloid process   10 cm proximal to ulnar styloid process   Just proximal to ulnar styloid process  Across hand at thumb web space   At base of 2nd digit   (Blank  rows = not tested)   FUNCTIONAL TESTS:    GAIT: Distance walked: front office to treatment room 8 Assistive device utilized: None Level of assistance: Complete Independence Comments: WNL   QUICK DASH SURVEY: 55%,  eval;   07/12/2023 45%  BREAST COMPLAINTS QUESTIONNAIRE Pain:7 Heaviness:7 Swollen feeling:10 Tense Skin:10 Redness:8 Bra Print:10 Size of Pores:10 Hard feeling: 10 Total:   72  /80 A Score over 9 indicates lymphedema issues in the breast    TODAY'S TREATMENT:                                                                                                                                          DATE:  07/12/2023 Pulleys x 2 min flexion and abd;VC's as needed to prevent compensation Ball rolls on wall x 10 forward with end range stretch, x 5 bilateral abd;TC to prevent UT compensation Measured Bilateral shoulder AROM Assessed bilateral breasts;mild fibrosis noted medially B, mild fibrosis around breast incision. No enlarged pores Pt is supine for MLD to right breast; Right supraclavicular LN's, Right inguinal LN's, 5 diaphragmatic breaths,Right axillo-inguinal pathway, and right breast directing to axi llo-inguinal pathway and repeating pathway. Pt was instructed in and practiced all steps with Visual and TC's . Pt reminded about importance of compression bra for breast lymphedema and she is going tomorrow to get a new bra.  06/29/2022 Wall Arc x 4 B Bilateral single arm pec stretch x 3 Pulleys flexion and abduction x 2:30 ea Ball rolls forward x 10 and lateral x 5 B Supine snow angels 5 Supine wand flex and scaption x 3 PROM bilateral shoulder flex, scaption, abduction, ER 06/21/2023 Wall arc x 3 B to stretch lat trunk on opposite side. Supine wand exs x 3 flexion and scaption STM bilateral UT/pectorals, Lateral trunk with cocoa butter with multiple shortened areas especially laterally PROM bilateral shoulder flexion, scaption, abd, ER with VC's prn to  relax Supine horizontal abd bilaterally x 5, scaption x 5  06/07/2023  Pulleys x 2 min flexion and abduction STM bilateral UT/pectorals, Lateral trunk with cocoa butter with multiple shortened areas especially laterally Supine wand flexion and scaption x 4 PROM bilateral shoulder flexion, scaption, abd, ER with VC's prn to relax Tried standing counter stretch but held secondary to LBP, Wall arc x 3 B Updated HEP with 4 post op exercises to perform 2x's per day, or may do supine wand in place of clasped hands flexion.  06/01/2023 Discussed treatment plan and interventions with pt including STM to decrease muscular tightness, stretching exercises for chest/shoulder to decrease muscle tightness, importance of proper compression bra (Gave pt new script), and return to MLD, CDT. Pt reviewed stargazer stretch and was reminded to start back to Abduction wall slides. Pt is not wearing a proper sports bra or compression  bra but will start back to one.    PATIENT EDUCATION:  Education details: Pt reminded about importance of compression, MLD and Flexitouch use and its importance to decrease breast swelling, gentle stretching of pecs and shoulder abduction Person educated: Patient Education method: Explanation Education comprehension: verbalized understanding and returned demonstration  HOME EXERCISE PROGRAM: Resume stargazer and wall slides for abd 2x/day 5 repetitions  ASSESSMENT:  CLINICAL IMPRESSION: Pt has only had 5 visits in this certification period due to other medical concerns. She has made good improvement in shoulder ROM and has achieved both ROM goals. She did well with review of Manual lymphatic drainage of the breast, but will require further review. She has not yet purchased her Compression bra, but plans to go tomorrow to do so. She was reminded of the importance of compression for breast lymphedema. Quick dash has improved by 10%, however, she has not yet achieved her goal of no  greater than 20%. She will benefit from continued therapy to be independent in breast manual lymphatic drainage and to be  further educated in strengthening of her UE's for home and  work activities  OBJECTIVE IMPAIRMENTS: decreased activity tolerance, decreased ROM, increased edema, impaired flexibility, impaired UE functional use, postural dysfunction, and pain.   ACTIVITY LIMITATIONS: carrying, lifting, sleeping, and reach over head  PARTICIPATION LIMITATIONS: cleaning and occupation  PERSONAL FACTORS: 3+ comorbidities: Bilateral breast cancer s/p surgery, chemo and radiation, now Stage IV Metastatic Breast Cancer with multiple bone METS in thoraacic and lumbar spine  are also affecting patient's functional outcome.   REHAB POTENTIAL: Good  CLINICAL DECISION MAKING: Stable/uncomplicated  EVALUATION COMPLEXITY: Low  GOALS: Goals reviewed with patient? Yes  SHORT TERM GOALS=LONG TERM GOALS: Target date: 07/12/2022  Pt will be independent in self MLD to decrease breast swelling Baseline: Goal status: IN Progress; needs review 2.  Pt will have breast complaints survey no greater than 20% to demonstrate improved swelling and decreased pain Baseline:  Goal status: IN PROGRESS 3.  Pt will report breast swelling improved by atleast 35-50% Baseline:  Goal status: In Progress  4.  Pt will improve bilateral shoulder AROM to 150 degrees for improved reaching Baseline:  Goal status: MET 07/12/2023 5.  Pt will improve bilateral shoulder abduction to 167 degrees for improved ability to work overhead Baseline:  Goal status: MET 07/12/2023 6.  Quick dash will improve to no greater than 20% to demonstrate improved function Baseline: 55% Goal status:In PROGRESS 7.  Pt will be compliant with appropriate compression bra to decrease swelling  Goal Status:IN progress; going tomorrow to get compression bra  PLAN:  PT FREQUENCY: 2x/week  PT DURATION: 4 wks  PLANNED INTERVENTIONS: therapeutic  Exercises, Manual Therapy, Self Care, Dry Needling  PLAN FOR NEXT SESSION: Did pt get compression bra? Measure arm circumferences,STM bilateral UT, Pectorals, lateral trunk, MFR right UE cording, AAROM exs to improve ROM,, MLD to right greater than left breast and review with pt., progress UE strength  Waynette Buttery, PT 07/12/2023, 4:46 PM

## 2023-07-13 ENCOUNTER — Encounter: Payer: Self-pay | Admitting: Hematology and Oncology

## 2023-07-13 ENCOUNTER — Other Ambulatory Visit (HOSPITAL_COMMUNITY): Payer: Self-pay

## 2023-07-13 MED ORDER — OXYCODONE-ACETAMINOPHEN 7.5-325 MG PO TABS
1.0000 | ORAL_TABLET | Freq: Four times a day (QID) | ORAL | 0 refills | Status: DC | PRN
  Filled 2023-07-13: qty 102, 13d supply, fill #0

## 2023-07-14 ENCOUNTER — Other Ambulatory Visit: Payer: Self-pay

## 2023-07-14 ENCOUNTER — Other Ambulatory Visit (HOSPITAL_COMMUNITY): Payer: Self-pay

## 2023-07-14 ENCOUNTER — Ambulatory Visit: Payer: 59

## 2023-07-18 ENCOUNTER — Other Ambulatory Visit: Payer: Self-pay

## 2023-07-18 ENCOUNTER — Inpatient Hospital Stay (HOSPITAL_BASED_OUTPATIENT_CLINIC_OR_DEPARTMENT_OTHER): Payer: 59 | Admitting: Hematology and Oncology

## 2023-07-18 VITALS — BP 141/73 | HR 74 | Temp 97.3°F | Resp 18 | Ht 71.65 in | Wt 209.9 lb

## 2023-07-18 DIAGNOSIS — C50412 Malignant neoplasm of upper-outer quadrant of left female breast: Secondary | ICD-10-CM | POA: Diagnosis not present

## 2023-07-18 DIAGNOSIS — C7951 Secondary malignant neoplasm of bone: Secondary | ICD-10-CM | POA: Diagnosis not present

## 2023-07-18 DIAGNOSIS — Z170 Estrogen receptor positive status [ER+]: Secondary | ICD-10-CM | POA: Diagnosis not present

## 2023-07-18 DIAGNOSIS — C50411 Malignant neoplasm of upper-outer quadrant of right female breast: Secondary | ICD-10-CM | POA: Diagnosis not present

## 2023-07-18 DIAGNOSIS — C773 Secondary and unspecified malignant neoplasm of axilla and upper limb lymph nodes: Secondary | ICD-10-CM | POA: Diagnosis not present

## 2023-07-18 DIAGNOSIS — Z79811 Long term (current) use of aromatase inhibitors: Secondary | ICD-10-CM | POA: Diagnosis not present

## 2023-07-18 DIAGNOSIS — R110 Nausea: Secondary | ICD-10-CM | POA: Diagnosis not present

## 2023-07-18 NOTE — Progress Notes (Signed)
Patient Care Team: Grayce Sessions, NP as PCP - General (Internal Medicine) Thomasene Ripple, DO as PCP - Cardiology (Cardiology) Antony Blackbird, MD as Consulting Physician (Radiation Oncology) Abigail Miyamoto, MD as Consulting Physician (General Surgery) Serena Croissant, MD as Consulting Physician (Hematology and Oncology) [name], Arty Baumgartner, NP as Nurse Practitioner (Nurse Practitioner)  DIAGNOSIS:  Encounter Diagnosis  Name Primary?   Malignant neoplasm of upper-outer quadrant of right breast in female, estrogen receptor positive (HCC) Yes    SUMMARY OF ONCOLOGIC HISTORY: Oncology History  Malignant neoplasm of upper-outer quadrant of right breast in female, estrogen receptor positive (HCC)  12/12/2020 Initial Diagnosis   status post bilateral breast biopsies 12/03/2020, showing             (1) on the right, a clinical T2 N1, stage IIa invasive lobular carcinoma, grade 2, estrogen and progesterone receptor strongly positive, HER2 not amplified, with an MIB-1 of 40%                         (a) the biopsied right axillary lymph node was positive with extracapsular extension                         (b) a second right breast mass also biopsied was a fibroadenoma, concordant  MAMMAPRINT tested on biopsy returned high risk, luminal type B, indicating significant benefit from chemo                         (c) biopsy of an area of non-mass-like enhancement in the upper right breast pending   12/17/2020 Cancer Staging   Staging form: Breast, AJCC 8th Edition - Clinical stage from 12/17/2020: Stage IIA (cT2, cN1, cM0, G2, ER+, PR+, HER2-) - Signed by Loa Socks, NP on 01/14/2022 Stage prefix: Initial diagnosis Histologic grading system: 3 grade system Laterality: Right Staged by: Pathologist and managing physician Stage used in treatment planning: Yes National guidelines used in treatment planning: Yes Type of national guideline used in treatment planning: NCCN    12/24/2020 Genetic Testing   Negative genetic testing:  No pathogenic variants detected on the [facility] BRCAplus panel (report date 12/24/2020) or the CancerNext-Expanded + RNAinsight panel (report date 12/31/2020). 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   The BRCAplus panel offered by [employer] and includes sequencing and deletion/duplication analysis for the following 8 genes: ATM, BRCA1, BRCA2, CDH1, CHEK2, PALB2, PTEN, and TP53. The CancerNext-Expanded + RNAinsight gene panel offered by [employer] and includes sequencing and rearrangement analysis for the following 77 genes: AIP, ALK, APC, ATM, AXIN2, BAP1, BARD1, BLM, BMPR1A, BRCA1, BRCA2, BRIP1, CDC73, CDH1, CDK4, CDKN1B, CDKN2A, CHEK2, CTNNA1, DICER1, FANCC, FH, FLCN, GALNT12, KIF1B, LZTR1, MAX, MEN1, MET, MLH1, MSH2, MSH3, MSH6, MUTYH, NBN, NF1, NF2, NTHL1, PALB2, PHOX2B, PMS2, POT1, PRKAR1A, PTCH1, PTEN, RAD51C, RAD51D, RB1, RECQL, RET, SDHA, SDHAF2, SDHB, SDHC, SDHD, SMAD4, SMARCA4, SMARCB1, SMARCE1, STK11, SUFU, TMEM127, TP53, TSC1, TSC2, VHL and XRCC2 (sequencing and deletion/duplication); EGFR, EGLN1, HOXB13, KIT, MITF, PDGFRA, POLD1 and POLE (sequencing only); EPCAM and GREM1 (deletion/duplication only). RNA data is routinely analyzed for use in variant interpretation for all genes.   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 Multifocal sclerotic bony metastatic disease greatest in the thoracic spine with scattered rib lesions and sclerosis as well. 2. No nodal or visceral metastasis about the chest. 3. Collateral pathways about the chest suggest some subclavian venous narrowing on the LEFT. This could also be related to arm position. Correlate clinically. 4. Signs of RIGHT breast lumpectomy. Potential postoperative changes in the LEFT breast with small focal area of nodularity in the lateral LEFT breast which could also be postoperative, correlate clinically and consider mammographic correlation as warranted. 5. Mild cardiac enlargement.   05/18/2022 Treatment Plan Change   Anastrozole + Verzenio 100mg  PO BID   12/07/2022 Cancer Staging   Staging form: Breast, AJCC 8th Edition - Pathologic: Stage IV (cM1) - Signed by Loa Socks, NP on 12/07/2022   Malignant neoplasm of upper-outer quadrant of left breast in female, estrogen receptor positive (HCC)  12/12/2020 Initial Diagnosis   on the left, a clinical T1b N0, stage IA invasive ductal carcinoma, grade 1 or 2, estrogen and progesterone receptor positive, HER2 not amplified, with an MIB 1 of 15%   12/17/2020 Cancer Staging   Staging form: Breast, AJCC 8th Edition - Clinical stage from 12/17/2020: Stage IA (cT1b, cN0, cM0, G2, ER+, PR+, HER2-) - Signed by Loa Socks, NP on 01/14/2022 Stage prefix: Initial diagnosis Histologic grading system: 3 grade system Laterality: Left Staged by: Pathologist and managing physician Stage used in treatment  planning: Yes National guidelines used in treatment planning: Yes Type of national guideline used in treatment planning: NCCN   12/24/2020 Genetic Testing   Negative genetic testing:  No pathogenic variants detected on the [facility] BRCAplus panel (report date 12/24/2020) or the CancerNext-Expanded + RNAinsight panel (report date 12/31/2020). 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   The BRCAplus panel offered by [employer] and includes sequencing and deletion/duplication analysis for the following 8 genes: ATM, BRCA1, BRCA2, CDH1, CHEK2, PALB2, PTEN, and TP53. The CancerNext-Expanded + RNAinsight gene panel offered by [employer] and includes sequencing and rearrangement analysis for the following 77 genes: AIP, ALK, APC, ATM, AXIN2, BAP1, BARD1, BLM, BMPR1A, BRCA1, BRCA2, BRIP1, CDC73, CDH1, CDK4, CDKN1B, CDKN2A, CHEK2, CTNNA1, DICER1, FANCC, FH, FLCN, GALNT12, KIF1B, LZTR1, MAX, MEN1, MET, MLH1, MSH2, MSH3, MSH6, MUTYH, NBN, NF1, NF2, NTHL1, PALB2, PHOX2B, PMS2, POT1, PRKAR1A, PTCH1, PTEN, RAD51C, RAD51D, RB1, RECQL, RET, SDHA, SDHAF2, SDHB, SDHC, SDHD, SMAD4, SMARCA4, SMARCB1, SMARCE1, STK11, SUFU, TMEM127, TP53, TSC1, TSC2, VHL and XRCC2 (sequencing and deletion/duplication); EGFR, EGLN1, HOXB13, KIT, MITF, PDGFRA, POLD1 and POLE (sequencing only); EPCAM and GREM1 (deletion/duplication only). RNA data is routinely analyzed for use in variant interpretation for all genes.   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 Multifocal sclerotic bony metastatic disease greatest in the thoracic spine with scattered rib lesions and sclerosis as well. 2. No nodal or visceral metastasis about the chest. 3. Collateral pathways about the chest suggest some subclavian venous narrowing on the LEFT. This could also be related to arm position. Correlate clinically. 4. Signs of RIGHT breast lumpectomy. Potential postoperative changes in the LEFT breast with small focal area of nodularity in the lateral LEFT breast which could also be postoperative, correlate clinically and consider mammographic correlation as warranted. 5. Mild cardiac enlargement.   05/18/2022 Treatment Plan Change   Anastrozole + Verzenio 100mg  PO BID   06/23/2022 - 07/07/2022 Radiation Therapy   06/23/2022 through 07/07/2022 Site Technique Total Dose (Gy) Dose per Fx (Gy) Completed Fx Beam Energies  Lumbar Spine: [id number]_LS_[id number] 3D 30/30 3 10/10 15X       CHIEF COMPLIANT: Follow-up to discuss results of submandibular lymph node biopsy  HISTORY OF PRESENT ILLNESS: 53 year old with a history of metastatic breast cancer is currently on anastrozole with Verzinio that started November 2023.  She has been tolerating the treatment fairly well without any major problems or concerns.  She has had enlarged lymph nodes on PET CT scan and underwent submandibular lymph node biopsy which came back as benign.   ALLERGIES:  has no known allergies.  MEDICATIONS:  Current Outpatient  Medications  Medication Sig Dispense Refill   abemaciclib (VERZENIO) 50 MG tablet Take 1 tablet (50 mg total) by mouth 2 (two) times daily. 56 tablet 3   albuterol (VENTOLIN HFA) 108 (90 Base) MCG/ACT inhaler Inhale 2 puffs by mouth into the lungs every 6 hours as needed for wheezing or shortness of breath. 18 g 2   amLODipine (NORVASC) 5 MG tablet Take 1 tablet (5 mg total) by mouth daily. 90 tablet 0   anastrozole (ARIMIDEX) 1 MG tablet Take 1 tablet (1 mg total) by mouth daily. 90 tablet 3   calcium-vitamin D (OSCAL WITH D) 500-5 MG-MCG tablet Take 1 tablet by mouth daily with supper. 90 tablet 2   gabapentin (NEURONTIN) 300 MG capsule Take 1 capsule (300 mg total) by mouth 3 (three) times daily. 90 capsule 6   montelukast (SINGULAIR) 10 MG tablet Take 1 tablet (10 mg total) by mouth at bedtime. 90 tablet 3   ondansetron (ZOFRAN) 4 MG tablet Take 1 tablet (4 mg total) by mouth every 8 (eight) hours. 90 tablet 11   oxyCODONE ER (XTAMPZA ER) 9 MG C12A Take 1 capsule by mouth 2 (two) times daily. 60 capsule 0   oxyCODONE-acetaminophen (PERCOCET) 7.5-325 MG tablet Take 1-2 tablets by mouth every 6 (six)  hours as needed for severe pain (pain score 7-10). 102 tablet 0   pantoprazole (PROTONIX) 40 MG tablet Take 1 tablet (40 mg total) by mouth daily. 30 tablet 3   polyethylene glycol (MIRALAX / GLYCOLAX) 17 g packet Take 17 g by mouth 2 (two) times daily. 60 each 3   prochlorperazine (COMPAZINE) 10 MG tablet Take 1 tablet (10 mg total) by mouth every 6 (six) hours as needed for nausea or vomiting. 30 tablet 0   propranolol (INDERAL) 10 MG tablet Take 1 tablet (10 mg total) by mouth 2 (two) times daily. Please call [phone] to schedule an appointment for future refills. Thank you. 3rd attempt. 30 tablet 0   senna (SENOKOT) 8.6 MG TABS tablet Take 2 tablets (17.2 mg total) by mouth at bedtime. 60 tablet 3   No current facility-administered medications for this visit.    PHYSICAL EXAMINATION: ECOG  PERFORMANCE STATUS: 1 - Symptomatic but completely ambulatory  Vitals:   07/18/23 1447  BP: (!) 141/73  Pulse: 74  Resp: 18  Temp: (!) 97.3 F (36.3 C)  SpO2: 100%   Filed Weights   07/18/23 1447  Weight: 209 lb 14.4 oz (95.2 kg)     LABORATORY DATA:  I have reviewed the data as listed    Latest Ref Rng & Units 05/26/2023   11:07 AM 02/24/2023    1:56 PM 01/31/2023    2:26 PM  CMP  Glucose 70 - 99 mg/dL 94  91  098   BUN 6 - 20 mg/dL 17  13  15    Creatinine 0.44 - 1.00 mg/dL 1.19  1.47  8.29   Sodium 135 - 145 mmol/L 140  141  142   Potassium 3.5 - 5.1 mmol/L 3.9  3.8  3.7   Chloride 98 - 111 mmol/L 104  106  107   CO2 22 - 32 mmol/L 32  31  28   Calcium 8.9 - 10.3 mg/dL 9.4  8.7  9.0   Total Protein 6.5 - 8.1 g/dL 7.1  6.9  7.2   Total Bilirubin <1.2 mg/dL 0.3  0.4  0.4   Alkaline Phos 38 - 126 U/L 65  52  53   AST 15 - 41 U/L 17  18  19    ALT 0 - 44 U/L 17  17  21      Lab Results  Component Value Date   WBC 4.4 05/26/2023   HGB 10.2 (L) 05/26/2023   HCT 32.7 (L) 05/26/2023   MCV 77.9 (L) 05/26/2023   PLT 217 05/26/2023   NEUTROABS 2.8 05/26/2023    ASSESSMENT & PLAN:  Malignant neoplasm of upper-outer quadrant of right breast in female, estrogen receptor positive (HCC) 12/03/2020: Bilateral breast biopsies: Right breast: T2N1 stage IIa grade 2 ILC, ER/PR positive, HER2 negative, Ki-67 40%, right axillary lymph node positive with extracapsular extension MammaPrint: High risk   01/27/2021 -06/08/2021 neoadjuvant chemotherapy with dose dense Adriamycin and Cytoxan x4 followed by Taxol x11   07/09/2021:Right lumpectomy: Grade 2 invasive lobular cancer, 2.5 cm, LCIS, margins negative, LCIS focally at anterior margin, 1/1 lymph node positive, ER 95%, PR 100%, HER2 negative, Ki-67 40% 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  ----------------------------------------------------------------------------------------------------------------------------- Current treatment: Verzinio (started 05/21/2022) with anastrozole Verzinio dose: 50 p.o. twice daily Toxicities: Nausea: Taking  premedications   06/02/2022: PET CT scan: No FDG activity in the multifocal sclerotic and mixed lytic and sclerotic lesions involving the spine, ribs, sacrum, pelvic bones and bilateral proximal femurs. 11/21/2022: CT CAP: Bone mets unchanged, 5 mm right lobe of the liver: Stable sigmoid colon: Mild diverticulitis 11/21/2022: Bone scan: Mild activity at L3 vertebral body compression fracture, stable 12/15/2022: MRI lumbar spine: Stable pathologic fracture L3 stable other bone mets 06/10/2023: PET/CT: Interval development of hypermetabolic lymphadenopathy left submandibular region, throughout mediastinal, bilateral hilar, upper abdomen (no uptake in the bones) 07/06/2023: Submandibular lymph node biopsy: Benign  Next PET CT scan in 6 months Continue with current treatment   No orders of the defined types were placed in this encounter.  The patient has a good understanding of the overall plan. she agrees with it. she will call with any problems that may develop before the next visit here. Total time spent: 30 mins including face to face time and time spent for planning, charting and co-ordination of care   Tamsen Meek, MD 07/18/23

## 2023-07-18 NOTE — Assessment & Plan Note (Signed)
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  ----------------------------------------------------------------------------------------------------------------------------- Current treatment: Verzinio (started 05/21/2022) with anastrozole Verzinio dose: 50 p.o. twice daily Toxicities: Nausea: Taking premedications   06/02/2022: PET CT scan: No FDG activity in the multifocal sclerotic and mixed lytic and sclerotic lesions involving the spine, ribs, sacrum, pelvic bones and bilateral proximal femurs. 11/21/2022: CT CAP: Bone mets unchanged, 5 mm right lobe of the liver: Stable sigmoid colon: Mild diverticulitis 11/21/2022: Bone scan: Mild activity at L3 vertebral body compression fracture, stable 12/15/2022: MRI lumbar spine: Stable pathologic fracture L3 stable other bone mets 06/10/2023: PET/CT: Interval development of hypermetabolic lymphadenopathy left submandibular region, throughout mediastinal, bilateral hilar, upper abdomen (no uptake in the bones) 07/06/2023: Submandibular lymph node biopsy: Benign  Continue with current treatment

## 2023-07-19 ENCOUNTER — Other Ambulatory Visit (HOSPITAL_COMMUNITY): Payer: Self-pay

## 2023-07-22 ENCOUNTER — Other Ambulatory Visit (HOSPITAL_COMMUNITY): Payer: Self-pay

## 2023-07-22 NOTE — Progress Notes (Signed)
Return to [facility] has been ready at the pharmacy since 1/23. LVM for patient.

## 2023-07-25 ENCOUNTER — Other Ambulatory Visit: Payer: Self-pay | Admitting: Nurse Practitioner

## 2023-07-25 ENCOUNTER — Ambulatory Visit: Payer: 59 | Attending: Hematology and Oncology

## 2023-07-25 ENCOUNTER — Other Ambulatory Visit: Payer: Self-pay | Admitting: Cardiology

## 2023-07-25 DIAGNOSIS — M25611 Stiffness of right shoulder, not elsewhere classified: Secondary | ICD-10-CM

## 2023-07-25 DIAGNOSIS — Z170 Estrogen receptor positive status [ER+]: Secondary | ICD-10-CM | POA: Diagnosis present

## 2023-07-25 DIAGNOSIS — C7951 Secondary malignant neoplasm of bone: Secondary | ICD-10-CM

## 2023-07-25 DIAGNOSIS — Z515 Encounter for palliative care: Secondary | ICD-10-CM

## 2023-07-25 DIAGNOSIS — I890 Lymphedema, not elsewhere classified: Secondary | ICD-10-CM

## 2023-07-25 DIAGNOSIS — C50411 Malignant neoplasm of upper-outer quadrant of right female breast: Secondary | ICD-10-CM | POA: Diagnosis present

## 2023-07-25 DIAGNOSIS — R293 Abnormal posture: Secondary | ICD-10-CM

## 2023-07-25 DIAGNOSIS — C50412 Malignant neoplasm of upper-outer quadrant of left female breast: Secondary | ICD-10-CM

## 2023-07-25 DIAGNOSIS — G893 Neoplasm related pain (acute) (chronic): Secondary | ICD-10-CM

## 2023-07-25 DIAGNOSIS — M25612 Stiffness of left shoulder, not elsewhere classified: Secondary | ICD-10-CM

## 2023-07-25 NOTE — Therapy (Signed)
OUTPATIENT PHYSICAL THERAPY  UPPER EXTREMITY ONCOLOGY EVALUATION  Patient Name: Chelsea Hansen MRN: [id number] DOB:1970/09/12, 53 y.o., female Today's Date: 07/25/2023  END OF SESSION:  PT End of Session - 07/25/23 1507     Visit Number 6    Number of Visits 13    Date for PT Re-Evaluation 08/09/23    Authorization Time Period 07/14/2023-08/09/2023    Authorization - Visit Number 1    Authorization - Number of Visits 8    Progress Note Due on Visit 13    PT Start Time 1507    PT Stop Time 1558    PT Time Calculation (min) 51 min    Activity Tolerance Patient tolerated treatment well    Behavior During Therapy WFL for tasks assessed/performed             Past Medical History:  Diagnosis Date   Allergy    seasonal allergies   Anxiety    on meds   Asthma    uses inhaler   Breast cancer (HCC) 2012   RIGHT lumpectomy   Cancer (HCC) 2022   RIGHT breast lump-dx 2022   Depression    on meds   DVT (deep venous thrombosis) (HCC) 2010   after hysterectomy   Family history of uterine cancer    GERD (gastroesophageal reflux disease)    with certain foods/OTC PRN meds   Headache(784.0)    History of radiation therapy    Bilateral breast- 09/03/21-11/02/21- Dr. Antony Blackbird   Hyperlipidemia    on meds   Hypertension    on meds   SVT (supraventricular tachycardia) (HCC)    Past Surgical History:  Procedure Laterality Date   ABDOMINAL HYSTERECTOMY     AXILLARY SENTINEL NODE BIOPSY Left 07/09/2021   Procedure: LEFT AXILLARY SENTINEL NODE BIOPSY;  Surgeon: Abigail Miyamoto, MD;  Location: [facility];  Service: General;  Laterality: Left;   BIOPSY  06/29/2022   Procedure: BIOPSY;  Surgeon: Sherrilyn Rist, MD;  Location: [facility];  Service: Gastroenterology;;   BREAST EXCISIONAL BIOPSY Right 09/16/2009   BREAST LUMPECTOMY WITH RADIOACTIVE SEED LOCALIZATION Bilateral 07/09/2021   Procedure: BILATERAL BREAST LUMPECTOMY WITH RADIOACTIVE SEED LOCALIZATION;   Surgeon: Abigail Miyamoto, MD;  Location: [facility];  Service: General;  Laterality: Bilateral;   BREAST SURGERY     lumpectomy   ESOPHAGOGASTRODUODENOSCOPY (EGD) WITH PROPOFOL N/A 06/29/2022   Procedure: ESOPHAGOGASTRODUODENOSCOPY (EGD) WITH PROPOFOL;  Surgeon: Sherrilyn Rist, MD;  Location: [facility];  Service: Gastroenterology;  Laterality: N/A;   PORT-A-CATH REMOVAL Left 07/09/2021   Procedure: REMOVAL PORT-A-CATH;  Surgeon: Abigail Miyamoto, MD;  Location: [facility];  Service: General;  Laterality: Left;   PORTACATH PLACEMENT Left 01/26/2021   Procedure: INSERTION PORT-A-CATH;  Surgeon: Abigail Miyamoto, MD;  Location: [facility];  Service: General;  Laterality: Left;   RADIOACTIVE SEED GUIDED AXILLARY SENTINEL LYMPH NODE Right 07/09/2021   Procedure: RADIOACTIVE SEED GUIDED RIGHT AXILLARY SENTINEL LYMPH NODE DISSECTION;  Surgeon: Abigail Miyamoto, MD;  Location: [facility];  Service: General;  Laterality: Right;   TUBAL LIGATION     Patient Active Problem List   Diagnosis Date Noted   Neoplasm related pain 12/07/2022   Chronic constipation 06/30/2022   Nausea and vomiting in adult 06/29/2022   Hematemesis 06/28/2022   Metastasis to bone (HCC) 05/20/2022   PSVT (paroxysmal supraventricular tachycardia) (HCC) 07/08/2021   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   GERD (gastroesophageal reflux disease) 05/27/2021  Hyperlipidemia 05/27/2021   Hypertension 05/27/2021   Pain management contract signed 05/04/2021   Chronic pain syndrome (breast cancer) 05/04/2021   Palpitations 03/25/2021   Sinus bradycardia 03/25/2021   Daytime somnolence 03/25/2021   Snoring 03/25/2021   Obesity (BMI 30-39.9) 03/25/2021   Port-A-Cath in place 01/27/2021   Genetic testing 12/24/2020   Family history of uterine cancer 12/17/2020   Malignant neoplasm of upper-outer quadrant of right breast in female, estrogen receptor positive (HCC) 12/12/2020    Malignant neoplasm of upper-outer quadrant of left breast in female, estrogen receptor positive (HCC) 12/12/2020   Hypokalemia 01/02/2012   Asthma 01/01/2012   Bradycardia 01/01/2012   Syncope 12/31/2011   Mediastinal adenopathy 12/31/2011   Anemia 12/31/2011   Chest pain 12/31/2011       REFERRING PROVIDER: Serena Croissant, MD  REFERRING DIAG: Lymphedema  THERAPY DIAG:  Malignant neoplasm of upper-outer quadrant of right breast in female, estrogen receptor positive (HCC)  Lymphedema, not elsewhere classified  Abnormal posture  Malignant neoplasm of upper-outer quadrant of left breast in female, estrogen receptor positive (HCC)  Stiffness of right shoulder, not elsewhere classified  Stiffness of left shoulder, not elsewhere classified  Metastasis to bone (HCC)  ONSET DATE: 1 month ago  Rationale for Evaluation and Treatment: Rehabilitation  SUBJECTIVE:                                                                                                                                                                                           SUBJECTIVE STATEMENT:  I just got off work after going in at 3:30 AM. I am tired now. Shoulders feel a little tight. I don't have to have any more biopsies right now; I am in remission. Swelling has improved a lot. I will go back and get my bras tomorrow. They wanted to wait until I got the word about my Biopsy. I am still looking for a house and have had lots of running around.  EVAL Bilateral axillary regions started swelling again,  and right breast greater than left breast, and both of my chest muscles  are hurting again. I started back to wearing my sports bra. I have been using my Flexi touch every day. For a little while the swelling feels better, but it always comes back. I need a new compression bra. I have been working a lot, and I took myself off of night shift. I feel like work contributed to my swelling.  PERTINENT HISTORY:   Neoadjuvant chemotherapy with Doxorubicin and Cyclophosphamide given x 4 beginning 01/27/2021 and completing on 03/10/2021 followed by weekly paclitaxel x 12 beginning 05/2021 07/09/2021: Right lumpectomy: Grade  2 invasive lobular cancer, 2.5 cm, LCIS, margins negative, LCIS focally at anterior margin, 1/1 lymph node positive, ER 95%, PR 100%, HER2 negative, Ki-67 40% Left lumpectomy: High-grade DCIS: 0.7 cm, margins negative, 0/1 lymph node negative ER 95%, PR 100%. Pt now  STAGE IV Breast Cancer with bone mets throughout thoracic and lumbar regions and is s/p radiation to her lumbar spine. Pt. sees Palliative Care for chronic pain management.    PAIN:  Are you having pain? Yes NPRS scale: 6/10 my back. Pain location: bilateral lateral trunk, chest arms, legs, back Pain orientation: Bilateral  PAIN TYPE: aching, sharp, and tight Pain description: intermittent  Aggravating factors: a lot of lifting/work activities,grocery shopping Relieving factors: Rest, Flexi touch,sports bra  PRECAUTIONS: Stage IV breast Cancer with bone METS, bilateral Breast lymphedema  RED FLAGS: Compression fracture: Yes: last year from bone METS    WEIGHT BEARING RESTRICTIONS: No  FALLS:  Has patient fallen in last 6 months? No  LIVING ENVIRONMENT: Lives with: lives with their family and lives alone Lives in: Other hotel Stairs: Yes; Internal: 15 steps; on left going up Has following equipment at home: NA  OCCUPATION: shift mgr for [employer]  LEISURE: shop, dominoes  HAND DOMINANCE: right   PRIOR LEVEL OF FUNCTION: Independent  PATIENT GOALS: Reduce swelling, reduce pain   OBJECTIVE: Note: Objective measures were completed at Evaluation unless otherwise noted.  COGNITION: Overall cognitive status: Within functional limits for tasks assessed   PALPATION: Very tender bilateral UT, pectorals, axilla and lateral trunk, medial and lateral breasts, Right medial upper arm  OBSERVATIONS / OTHER  ASSESSMENTS: Slight cording noted in right axilla to medial upper arm, very sensitive to light touch,Fibrosis noted bilateral breasts medial and lateral, Right axilla with increased swelling, mild peau d'orange right inferior breast with some enlarged pores bilaterally  SENSATION: Light touch: Deficits     POSTURE: Forward head, rounded shoulders  UPPER EXTREMITY AROM/PROM:  A/PROM RIGHT   eval  RIGHT 07/12/2023  Shoulder extension 40, tight axilla 52  Shoulder flexion 140 +axilla 150  Shoulder abduction 147,+axilla 168  Shoulder internal rotation 35 + 67  Shoulder external rotation 85+ 95    (Blank rows = not tested)  A/PROM LEFT   eval LEFT 07/12/2023  Shoulder extension 47 54  Shoulder flexion 137 axilla 155  Shoulder abduction 146, axilla 170  Shoulder internal rotation Pain shoulder;held 55  Shoulder external rotation  98    (Blank rows = not tested)  CERVICAL AROM: All within functional limits:      UPPER EXTREMITY STRENGTH:   LYMPHEDEMA ASSESSMENTS:   SURGERY TYPE/DATE: 07/09/2021 BILATERAL BREAST LUMPECTOMY WITH RADIOACTIVE SEED LOCALIZATION RADIOACTIVE SEED GUIDED RIGHT AXILLARY TARGETED LYMPH NODE DISSECTION LEFT AXILLARY SENTINEL NODE BIOPSY LEFT AXILLARY LYMPH NODE MAPPING WITH SENTIMAG  NUMBER OF LYMPH NODES REMOVED: Right 1/1, Left 0/1   CHEMOTHERAPY: Neoadjuvant  RADIATION:Bilateral Breast and lumbar spine  HORMONE TREATMENT: YES  INFECTIONS: NO   LYMPHEDEMA ASSESSMENTS:   LANDMARK RIGHT  eval  At axilla    15 cm proximal to olecranon process   10 cm proximal to olecranon process   Olecranon process   15 cm proximal to ulnar styloid process   10 cm proximal to ulnar styloid process   Just proximal to ulnar styloid process   Across hand at thumb web space   At base of 2nd digit   (Blank rows = not tested)  LANDMARK LEFT  eval  At axilla    15 cm proximal to  olecranon process   10 cm proximal to olecranon process   Olecranon  process   15 cm proximal to ulnar styloid process   10 cm proximal to ulnar styloid process   Just proximal to ulnar styloid process   Across hand at thumb web space   At base of 2nd digit   (Blank rows = not tested)   FUNCTIONAL TESTS:    GAIT: Distance walked: front office to treatment room 8 Assistive device utilized: None Level of assistance: Complete Independence Comments: WNL   QUICK DASH SURVEY: 55%,  eval;   07/12/2023 45%  BREAST COMPLAINTS QUESTIONNAIRE Pain:7 Heaviness:7 Swollen feeling:10 Tense Skin:10 Redness:8 Bra Print:10 Size of Pores:10 Hard feeling: 10 Total:   72  /80 A Score over 9 indicates lymphedema issues in the breast    TODAY'S TREATMENT:                                                                                                                                          DATE:  07/25/2023 Overhead pulleys flexion and abduction to loosen up. Ball rolls flexion x 10, abd x 5 Supine wand flexion and scaption x 5 ea Supine horizontal abd red band x 10, flexion red x 10, ER Bilateral x 10, Sword with thumb up x 10 ea PROM bilateral shoulder flexion, scaption, abduction, ER to restore fxl mobility 07/12/2023 Pulleys x 2 min flexion and abd;VC's as needed to prevent compensation Ball rolls on wall x 10 forward with end range stretch, x 5 bilateral abd;TC to prevent UT compensation Measured Bilateral shoulder AROM Assessed bilateral breasts;mild fibrosis noted medially B, mild fibrosis around breast incision. No enlarged pores Pt is supine for MLD to right breast; Right supraclavicular LN's, Right inguinal LN's, 5 diaphragmatic breaths,Right axillo-inguinal pathway, and right breast directing to axi llo-inguinal pathway and repeating pathway. Pt was instructed in and practiced all steps with Visual and TC's . Pt reminded about importance of compression bra for breast lymphedema and she is going tomorrow to get a new bra.  06/29/2022 Wall Arc x 4  B Bilateral single arm pec stretch x 3 Pulleys flexion and abduction x 2:30 ea Ball rolls forward x 10 and lateral x 5 B Supine snow angels 5 Supine wand flex and scaption x 3 PROM bilateral shoulder flex, scaption, abduction, ER 06/21/2023 Wall arc x 3 B to stretch lat trunk on opposite side. Supine wand exs x 3 flexion and scaption STM bilateral UT/pectorals, Lateral trunk with cocoa butter with multiple shortened areas especially laterally PROM bilateral shoulder flexion, scaption, abd, ER with VC's prn to relax Supine horizontal abd bilaterally x 5, scaption x 5  06/07/2023  Pulleys x 2 min flexion and abduction STM bilateral UT/pectorals, Lateral trunk with cocoa butter with multiple shortened areas especially laterally Supine wand flexion and scaption x 4 PROM bilateral shoulder flexion, scaption, abd, ER with VC's prn  to relax Tried standing counter stretch but held secondary to LBP, Wall arc x 3 B Updated HEP with 4 post op exercises to perform 2x's per day, or may do supine wand in place of clasped hands flexion.  06/01/2023 Discussed treatment plan and interventions with pt including STM to decrease muscular tightness, stretching exercises for chest/shoulder to decrease muscle tightness, importance of proper compression bra (Gave pt new script), and return to MLD, CDT. Pt reviewed stargazer stretch and was reminded to start back to Abduction wall slides. Pt is not wearing a proper sports bra or compression bra but will start back to one.    PATIENT EDUCATION:  Education details: Pt reminded about importance of compression, MLD and Flexitouch use and its importance to decrease breast swelling, gentle stretching of pecs and shoulder abduction Person educated: Patient Education method: Explanation Education comprehension: verbalized understanding and returned demonstration  HOME EXERCISE PROGRAM: Resume stargazer and wall slides for abd 2x/day 5  repetitions  ASSESSMENT:  CLINICAL IMPRESSION: Pt continues with end range tightness but greatly improved by end of session. Resumed strengthening in supine with pt requiring occasional VC and TC for proper form. Pt indicates breast swelling is doing well. She will get her new bras tomorrow.  OBJECTIVE IMPAIRMENTS: decreased activity tolerance, decreased ROM, increased edema, impaired flexibility, impaired UE functional use, postural dysfunction, and pain.   ACTIVITY LIMITATIONS: carrying, lifting, sleeping, and reach over head  PARTICIPATION LIMITATIONS: cleaning and occupation  PERSONAL FACTORS: 3+ comorbidities: Bilateral breast cancer s/p surgery, chemo and radiation, now Stage IV Metastatic Breast Cancer with multiple bone METS in thoraacic and lumbar spine  are also affecting patient's functional outcome.   REHAB POTENTIAL: Good  CLINICAL DECISION MAKING: Stable/uncomplicated  EVALUATION COMPLEXITY: Low  GOALS: Goals reviewed with patient? Yes  SHORT TERM GOALS=LONG TERM GOALS: Target date: 07/12/2022  Pt will be independent in self MLD to decrease breast swelling Baseline: Goal status: IN Progress; needs review 2.  Pt will have breast complaints survey no greater than 20% to demonstrate improved swelling and decreased pain Baseline:  Goal status: IN PROGRESS 3.  Pt will report breast swelling improved by atleast 35-50% Baseline:  Goal status: In Progress  4.  Pt will improve bilateral shoulder AROM to 150 degrees for improved reaching Baseline:  Goal status: MET 07/12/2023 5.  Pt will improve bilateral shoulder abduction to 167 degrees for improved ability to work overhead Baseline:  Goal status: MET 07/12/2023 6.  Quick dash will improve to no greater than 20% to demonstrate improved function Baseline: 55% Goal status:In PROGRESS 7.  Pt will be compliant with appropriate compression bra to decrease swelling  Goal Status:IN progress; going tomorrow to get compression  bra  PLAN:  PT FREQUENCY: 2x/week  PT DURATION: 4 wks  PLANNED INTERVENTIONS: therapeutic Exercises, Manual Therapy, Self Care, Dry Needling  PLAN FOR NEXT SESSION: Did pt get compression bra? Measure arm circumferences,STM bilateral UT, Pectorals, lateral trunk, MFR right UE cording, AAROM exs to improve ROM,, MLD to right greater than left breast and review with pt., progress UE strength  Waynette Buttery, PT 07/25/2023, 3:59 PM

## 2023-07-26 ENCOUNTER — Other Ambulatory Visit: Payer: Self-pay

## 2023-07-26 ENCOUNTER — Other Ambulatory Visit (HOSPITAL_COMMUNITY): Payer: Self-pay

## 2023-07-26 ENCOUNTER — Encounter: Payer: Self-pay | Admitting: Hematology and Oncology

## 2023-07-26 MED ORDER — OXYCODONE-ACETAMINOPHEN 7.5-325 MG PO TABS
1.0000 | ORAL_TABLET | Freq: Four times a day (QID) | ORAL | 0 refills | Status: DC | PRN
  Filled 2023-07-27: qty 102, 13d supply, fill #0

## 2023-07-27 ENCOUNTER — Other Ambulatory Visit (HOSPITAL_COMMUNITY): Payer: Self-pay

## 2023-07-27 ENCOUNTER — Other Ambulatory Visit: Payer: Self-pay

## 2023-07-28 ENCOUNTER — Ambulatory Visit: Payer: 59

## 2023-07-29 ENCOUNTER — Ambulatory Visit: Payer: 59

## 2023-07-29 ENCOUNTER — Telehealth: Payer: Self-pay

## 2023-07-29 NOTE — Telephone Encounter (Signed)
 Called pt and left message about No show at 9:00 today. Advised there was an appt available 1 hour later and to call if she would like it.

## 2023-08-01 ENCOUNTER — Ambulatory Visit: Payer: Self-pay | Admitting: Licensed Clinical Social Worker

## 2023-08-01 NOTE — Patient Instructions (Signed)
 Visit Information  Thank you for taking time to visit with me today. Please don't hesitate to contact me if I can be of assistance to you.   Following are the goals we discussed today:   Goals Addressed             This Visit's Progress    patient said she receives treatments for cancer at [facility]       Interventions:   Spoke with client via phone today about her needs and status Client was informed of program support with RN, LCSW, Pharmacist Client said she receives treatments for cancer at [facility]. Elaisha said she has been getting care at [facility] for about 3 years Discussed pain issues of client. Client goes to Pain Clinic at [facility] Client said her insurance was not paying for prescribed dose of her Pain Medication.  Client has [employer] and [facility] ([employer]). Encouraged client to call customer support on her [employer] Card to talk with rep about insurance issues in paying for her pain medication. Patient has [employer] and can call customer support at [place] to discuss her pain medication reimbursement issues Discussed vision of client. She said her vision was blurry occasionally Discussed family support. Client said she had good family support and that this support was very helpful Reviewed transport needs. Merriann said she was driving well. She has no transport needs at present Discussed physical therapy support. She said she participates in physical therapy sessions .  Discussed sleep issues. She said she sometimes has decreased sleep.  Encouraged Nelia to call LCSW as needed for SW support at [phone] Salbador Crate for phone call with LCSW today Danicka was appreciative of call from LCSW today          Our next appointment is by telephone on 10/10/23 at 2:00 PM   Please call the care guide team at [phone] if you need to cancel or reschedule your  appointment.   If you are experiencing a Mental Health or Behavioral Health Crisis or need someone to talk to, please go to [facility] [address], [place] [phone])   The patient verbalized understanding of instructions, educational materials, and care plan provided today and DECLINED offer to receive copy of patient instructions, educational materials, and care plan.   The patient has been provided with contact information for the care management team and has been advised to call with any health related questions or concerns.    Alexandria Angel  MSW, LCSW [facility] Licensed Clinical Social Worker Direct Dial:  [phone] Fax:  [phone] Website:  [email or web address]

## 2023-08-01 NOTE — Progress Notes (Signed)
Palliative Medicine [name]  [phone] [phone]   Name: Chelsea Hansen Date: 08/01/2023 MRN: [id number]  DOB: 1970/07/08  Patient Care Team: Chelsea Sessions, NP as PCP - General (Internal Medicine) Chelsea Ripple, DO as PCP - Cardiology (Cardiology) Chelsea Blackbird, MD as Consulting Physician (Radiation Oncology) Chelsea Miyamoto, MD as Consulting Physician (General Surgery) Chelsea Croissant, MD as Consulting Physician (Hematology and Oncology) Chelsea Hansen, Arty Baumgartner, NP as Nurse Practitioner (Nurse Practitioner)   I connected with Chelsea Hansen on 08/01/23 at  3:00 PM EST by phone and verified that I am speaking with the correct person using two identifiers.   I discussed the limitations, risks, security and privacy concerns of performing an evaluation and management service by telemedicine and the availability of in-person appointments. I also discussed with the patient that there may be a patient responsible charge related to this service. The patient expressed understanding and agreed to proceed.   Other persons participating in the visit and their role in the encounter: n/a   Patient's location: home  Provider's location: [facility]   Chief Complaint: follow up of symptom management    INTERVAL HISTORY: Chelsea Hansen is a 53 y.o. 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  MRI of L-spine (12/23) showed L3 pathologic fracture and significant lesion along S1 with sclerotic changes.  Palliative ask to see for symptom management and goals of care.   SOCIAL HISTORY:     reports that she has never smoked. She has never used smokeless  tobacco. She reports that she does not currently use alcohol after a past usage of about 7.0 standard drinks of alcohol per week. She reports that she does not use drugs.  ADVANCE DIRECTIVES:   CODE STATUS:   PAST MEDICAL HISTORY: Past Medical History:  Diagnosis Date   Allergy    seasonal allergies   Anxiety    on meds   Asthma    uses inhaler   Breast cancer (HCC) 2012   RIGHT lumpectomy   Cancer (HCC) 2022   RIGHT breast lump-dx 2022   Depression    on meds   DVT (deep venous thrombosis) (HCC) 2010   after hysterectomy   Family history of uterine cancer    GERD (gastroesophageal reflux disease)    with certain foods/OTC PRN meds   Headache(784.0)    History of radiation therapy    Bilateral breast- 09/03/21-11/02/21- Dr. Antony Hansen   Hyperlipidemia    on meds   Hypertension    on meds   SVT (supraventricular tachycardia) (HCC)     ALLERGIES:  has no known allergies.  MEDICATIONS:  Current Outpatient Medications  Medication Sig Dispense Refill   abemaciclib (VERZENIO) 50 MG tablet Take 1 tablet (50 mg total) by mouth 2 (two) times daily. 56 tablet 3   albuterol (VENTOLIN HFA) 108 (90 Base) MCG/ACT inhaler Inhale 2 puffs by mouth into the lungs every 6 hours as needed for wheezing or shortness of breath. 18 g 2   amLODipine (NORVASC) 5 MG tablet Take 1 tablet (5 mg total) by mouth daily. 90 tablet 0   anastrozole (ARIMIDEX) 1 MG tablet Take 1 tablet (1 mg total) by mouth daily. 90 tablet 3   calcium-vitamin D (OSCAL  WITH D) 500-5 MG-MCG tablet Take 1 tablet by mouth daily with supper. 90 tablet 2   gabapentin (NEURONTIN) 300 MG capsule Take 1 capsule (300 mg total) by mouth 3 (three) times daily. 90 capsule 6   montelukast (SINGULAIR) 10 MG tablet Take 1 tablet (10 mg total) by mouth at bedtime. 90 tablet 3   oxyCODONE ER (XTAMPZA ER) 9 MG C12A Take 1 capsule by mouth 2 (two) times daily. 60 capsule 0   oxyCODONE-acetaminophen (PERCOCET) 7.5-325 MG tablet Take 1-2  tablets by mouth every 6 (six) hours as needed for severe pain (pain score 7-10). 102 tablet 0   pantoprazole (PROTONIX) 40 MG tablet Take 1 tablet (40 mg total) by mouth daily. 30 tablet 3   polyethylene glycol (MIRALAX / GLYCOLAX) 17 g packet Take 17 g by mouth 2 (two) times daily. 60 each 3   prochlorperazine (COMPAZINE) 10 MG tablet Take 1 tablet (10 mg total) by mouth every 6 (six) hours as needed for nausea or vomiting. 30 tablet 0   propranolol (INDERAL) 10 MG tablet Take 1 tablet (10 mg total) by mouth 2 (two) times daily. Please call [phone] to schedule an appointment for future refills. Thank you. 3rd attempt. 30 tablet 0   senna (SENOKOT) 8.6 MG TABS tablet Take 2 tablets (17.2 mg total) by mouth at bedtime. 60 tablet 3   No current facility-administered medications for this visit.    VITAL SIGNS: There were no vitals taken for this visit. There were no vitals filed for this visit.   Estimated body mass index is 28.75 kg/m as calculated from the following:   Height as of 07/18/23: 5' 11.65" (1.82 m).   Weight as of 07/18/23: 209 lb 14.4 oz (95.2 kg).   PERFORMANCE STATUS (ECOG) : 1 - Symptomatic but completely ambulatory   IMPRESSION: I connected by phone with Chelsea Hansen for follow-up. No acute distress. States she is feeling "under the weather" today. Several people at her job have been sick. Denies vomiting, constipation, diarrhea. Some fatigue. Is remaining as active as possible.   Neoplasm related pain Chelsea Hansen reports pain has increased since she has not been able to obtain her extended release medication due to insurance no longer covering. She reports her back pain has increased were previously well controlled.   We discussed at length her current regimen.  She has not taken Xtampza 9 mg in over a month. Currently taking Percocet every 6 hours as needed for breakthrough pain.  Tolerating medications without difficulty. Taking as prescribed. Refills have been appropriate  timing based on pill count. We will work with insurance to obtain authorization on Xtampza versus considering MS Contin. Education provided to patient and she verbalized understanding.  Pain contract on file.  Constipation Improved with bowel regimen.   PLAN: Will consider MS Contin if insurance will approve and continues to deny Xtampza    Continue Percocet every 6 hours as needed for breakthrough pain. Have been taking more often due to absence of long-acting.  MiraLAX twice daily for bowel regimen Senna daily Protonix 40 mg daily  Patient reports pain is well-controlled. I will plan to see patient back in 4-6 weeks in collaboration with her other oncology appointments. Patient knows to contact our office sooner if needed.   Patient expressed understanding and was in agreement with this plan. She also understands that She can call the clinic at any time with any questions, concerns, or complaints.     Any controlled substances utilized were prescribed  in the context of palliative care. PDMP has been reviewed.    Visit consisted of counseling and education dealing with the complex and emotionally intense issues of symptom management and palliative care in the setting of serious and potentially life-threatening illness.  Willette Alma, AGPCNP-BC  Palliative Medicine Team/ [name]

## 2023-08-01 NOTE — Patient Outreach (Signed)
 Care Coordination   Follow Up Visit Note   08/01/2023 Name: Chelsea Hansen MRN: [id number] DOB: 1971/02/14  Chelsea Hansen is a 53 y.o. year old female who sees Marius Siemens, NP for primary care. I spoke with  Chelsea Hansen by phone today.  What matters to the patients health and wellness today?  Patient said she receives treatments for cancer at [facility]     Goals Addressed             This Visit's Progress    patient said she receives treatments for cancer at [facility]       Interventions:   Spoke with client via phone today about her needs and status Client was informed of program support with RN, LCSW, Pharmacist Client said she receives treatments for cancer at [facility]. Chelsea Hansen said she has been getting care at [facility] for about 3 years Discussed pain issues of client. Client goes to Pain Clinic at [facility] Client said her insurance was not paying for prescribed dose of her Pain Medication.  Client has [employer] and [employer] ([employer] Encouraged client to call customer support on her Chelsea Hansen Card to talk with rep about insurance issues in paying for her pain medication. Patient has [employer] and can call customer support at [place] to discuss her pain medication reimbursement issues Discussed vision of client. She said her vision was blurry occasionally Discussed family support. Client said she had good family support and that this support was very helpful Reviewed transport needs. Chelsea Hansen said she was driving well. She has no transport needs at present Discussed physical therapy support. She said she participates in physical therapy sessions .  Discussed sleep issues. She said she sometimes has decreased sleep.  Encouraged Chelsea Hansen to call LCSW as needed for SW support at [phone] Chelsea Hansen for phone call with LCSW today Chelsea Hansen was  appreciative of call from LCSW today          SDOH assessments and interventions completed:  Yes  SDOH Interventions Today    Flowsheet Row Most Recent Value  SDOH Interventions   Depression Interventions/Treatment  Counseling  Physical Activity Interventions Other (Comments)  [client participates in physical therapy sessions as she is able]  Stress Interventions Provide Counseling  [client has stress in managing medical needs. She goes to [facility] as scheduled for Cancer treatments]        Care Coordination Interventions:  Yes, provided   Interventions Today    Flowsheet Row Most Recent Value  Chronic Disease   Chronic disease during today's visit Other  [spoke with client about client needs]  General Interventions   General Interventions Discussed/Reviewed General Interventions Discussed, Community Resources  Education Interventions   Education Provided Provided Education  Provided Verbal Education On [employer]  Mental Health Interventions   Mental Health Discussed/Reviewed Coping Strategies  [client has stress in managing medical needs. She receives Cancer treatments as scheduled at [facility].  She has Pain issues she tries to manage]  Nutrition Interventions   Nutrition Discussed/Reviewed Nutrition Discussed  Pharmacy Interventions   Pharmacy Dicussed/Reviewed Pharmacy Topics Discussed  Safety Interventions   Safety Discussed/Reviewed Fall Risk        Follow up plan: Follow up call scheduled for 10/10/23 at 2:00 PM     Encounter Outcome:  Patient Visit Completed    Chelsea Hansen  MSW,  LCSW [place]/Value Based Care [facility] Licensed Clinical Social Worker Direct Dial:  [phone] Fax:  [phone] Website:  [email or web address]

## 2023-08-02 ENCOUNTER — Other Ambulatory Visit: Payer: Self-pay

## 2023-08-03 ENCOUNTER — Inpatient Hospital Stay: Payer: 59 | Attending: Adult Health | Admitting: Nurse Practitioner

## 2023-08-03 ENCOUNTER — Other Ambulatory Visit (HOSPITAL_COMMUNITY): Payer: Self-pay

## 2023-08-03 ENCOUNTER — Encounter: Payer: Self-pay | Admitting: Nurse Practitioner

## 2023-08-03 DIAGNOSIS — G893 Neoplasm related pain (acute) (chronic): Secondary | ICD-10-CM

## 2023-08-03 DIAGNOSIS — C7951 Secondary malignant neoplasm of bone: Secondary | ICD-10-CM

## 2023-08-03 DIAGNOSIS — R530 Neoplastic (malignant) related fatigue: Secondary | ICD-10-CM

## 2023-08-03 DIAGNOSIS — C50411 Malignant neoplasm of upper-outer quadrant of right female breast: Secondary | ICD-10-CM

## 2023-08-03 DIAGNOSIS — Z515 Encounter for palliative care: Secondary | ICD-10-CM

## 2023-08-03 DIAGNOSIS — Z170 Estrogen receptor positive status [ER+]: Secondary | ICD-10-CM

## 2023-08-03 MED ORDER — MORPHINE SULFATE ER 15 MG PO TBCR
15.0000 mg | EXTENDED_RELEASE_TABLET | Freq: Two times a day (BID) | ORAL | 0 refills | Status: DC
Start: 2023-08-03 — End: 2023-08-29
  Filled 2023-08-03 – 2023-08-19 (×2): qty 60, 30d supply, fill #0

## 2023-08-03 MED ORDER — OXYCODONE-ACETAMINOPHEN 7.5-325 MG PO TABS
1.0000 | ORAL_TABLET | Freq: Four times a day (QID) | ORAL | 0 refills | Status: DC | PRN
  Filled 2023-08-08: qty 102, 13d supply, fill #0

## 2023-08-05 ENCOUNTER — Other Ambulatory Visit: Payer: Self-pay

## 2023-08-08 ENCOUNTER — Other Ambulatory Visit (HOSPITAL_COMMUNITY): Payer: Self-pay

## 2023-08-08 ENCOUNTER — Other Ambulatory Visit: Payer: Self-pay

## 2023-08-08 ENCOUNTER — Ambulatory Visit: Payer: 59

## 2023-08-08 DIAGNOSIS — R293 Abnormal posture: Secondary | ICD-10-CM

## 2023-08-08 DIAGNOSIS — I890 Lymphedema, not elsewhere classified: Secondary | ICD-10-CM

## 2023-08-08 DIAGNOSIS — M25611 Stiffness of right shoulder, not elsewhere classified: Secondary | ICD-10-CM

## 2023-08-08 DIAGNOSIS — M25612 Stiffness of left shoulder, not elsewhere classified: Secondary | ICD-10-CM

## 2023-08-08 DIAGNOSIS — Z170 Estrogen receptor positive status [ER+]: Secondary | ICD-10-CM

## 2023-08-08 DIAGNOSIS — C7951 Secondary malignant neoplasm of bone: Secondary | ICD-10-CM

## 2023-08-08 DIAGNOSIS — C50412 Malignant neoplasm of upper-outer quadrant of left female breast: Secondary | ICD-10-CM

## 2023-08-08 DIAGNOSIS — C50411 Malignant neoplasm of upper-outer quadrant of right female breast: Secondary | ICD-10-CM | POA: Diagnosis not present

## 2023-08-08 NOTE — Progress Notes (Signed)
 Specialty Pharmacy Refill Coordination Note  Chelsea Hansen is a 54 y.o. female contacted today regarding refills of specialty medication(s) Abemaciclib Chelsea Hansen)   Patient requested [facility] at [facility] at [place] date: 08/10/23   Medication will be filled on 08/09/23.

## 2023-08-08 NOTE — Therapy (Addendum)
 OUTPATIENT PHYSICAL THERAPY  UPPER EXTREMITY ONCOLOGY EVALUATION  Patient Name: Chelsea Hansen MRN: [id number] DOB:[birth year], 53 y.o., female Today's Date: 08/08/2023  END OF SESSION:  PT End of Session - 08/08/23 1052     Visit Number 7    Number of Visits 13    Date for PT Re-Evaluation 08/09/23    Authorization Time Period 07/14/2023-08/09/2023    Authorization - Visit Number 2    Authorization - Number of Visits 8    Progress Note Due on Visit 13    PT Start Time 1100    PT Stop Time 1144    PT Time Calculation (min) 44 min    Activity Tolerance Patient tolerated treatment well    Behavior During Therapy [facility] for tasks assessed/performed             Past Medical History:  Diagnosis Date   Allergy    seasonal allergies   Anxiety    on meds   Asthma    uses inhaler   Breast cancer (HCC) 2012   RIGHT lumpectomy   Cancer (HCC) 2022   RIGHT breast lump-dx 2022   Depression    on meds   DVT (deep venous thrombosis) (HCC) 2010   after hysterectomy   Family history of uterine cancer    GERD (gastroesophageal reflux disease)    with certain foods/OTC PRN meds   Headache(784.0)    History of radiation therapy    Bilateral breast- 09/03/21-11/02/21- Dr. Antony Blackbird   Hyperlipidemia    on meds   Hypertension    on meds   SVT (supraventricular tachycardia) (HCC)    Past Surgical History:  Procedure Laterality Date   ABDOMINAL HYSTERECTOMY     AXILLARY SENTINEL NODE BIOPSY Left 07/09/2021   Procedure: LEFT AXILLARY SENTINEL NODE BIOPSY;  Surgeon: Abigail Miyamoto, MD;  Location: [facility];  Service: General;  Laterality: Left;   BIOPSY  06/29/2022   Procedure: BIOPSY;  Surgeon: Sherrilyn Rist, MD;  Location: [facility];  Service: Gastroenterology;;   BREAST EXCISIONAL BIOPSY Right 09/16/2009   BREAST LUMPECTOMY WITH RADIOACTIVE SEED LOCALIZATION Bilateral 07/09/2021   Procedure: BILATERAL BREAST LUMPECTOMY WITH RADIOACTIVE SEED LOCALIZATION;   Surgeon: Abigail Miyamoto, MD;  Location: [facility];  Service: General;  Laterality: Bilateral;   BREAST SURGERY     lumpectomy   ESOPHAGOGASTRODUODENOSCOPY (EGD) WITH PROPOFOL N/A 06/29/2022   Procedure: ESOPHAGOGASTRODUODENOSCOPY (EGD) WITH PROPOFOL;  Surgeon: Sherrilyn Rist, MD;  Location: [facility];  Service: Gastroenterology;  Laterality: N/A;   PORT-A-CATH REMOVAL Left 07/09/2021   Procedure: REMOVAL PORT-A-CATH;  Surgeon: Abigail Miyamoto, MD;  Location: [facility];  Service: General;  Laterality: Left;   PORTACATH PLACEMENT Left 01/26/2021   Procedure: INSERTION PORT-A-CATH;  Surgeon: Abigail Miyamoto, MD;  Location: [facility];  Service: General;  Laterality: Left;   RADIOACTIVE SEED GUIDED AXILLARY SENTINEL LYMPH NODE Right 07/09/2021   Procedure: RADIOACTIVE SEED GUIDED RIGHT AXILLARY SENTINEL LYMPH NODE DISSECTION;  Surgeon: Abigail Miyamoto, MD;  Location:  SURGERY CENTER;  Service: General;  Laterality: Right;   TUBAL LIGATION     Patient Active Problem List   Diagnosis Date Noted   Neoplasm related pain 12/07/2022   Chronic constipation 06/30/2022   Nausea and vomiting in adult 06/29/2022   Hematemesis 06/28/2022   Metastasis to bone (HCC) 05/20/2022   PSVT (paroxysmal supraventricular tachycardia) (HCC) 07/08/2021   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   GERD (gastroesophageal reflux disease) 05/27/2021  Hyperlipidemia 05/27/2021   Hypertension 05/27/2021   Pain management contract signed 05/04/2021   Chronic pain syndrome (breast cancer) 05/04/2021   Palpitations 03/25/2021   Sinus bradycardia 03/25/2021   Daytime somnolence 03/25/2021   Snoring 03/25/2021   Obesity (BMI 30-39.9) 03/25/2021   Port-A-Cath in place 01/27/2021   Genetic testing 12/24/2020   Family history of uterine cancer 12/17/2020   Malignant neoplasm of upper-outer quadrant of right breast in female, estrogen receptor positive (HCC) 12/12/2020    Malignant neoplasm of upper-outer quadrant of left breast in female, estrogen receptor positive (HCC) 12/12/2020   Hypokalemia 01/02/2012   Asthma 01/01/2012   Bradycardia 01/01/2012   Syncope 12/31/2011   Mediastinal adenopathy 12/31/2011   Anemia 12/31/2011   Chest pain 12/31/2011       REFERRING PROVIDER: Serena Croissant, MD  REFERRING DIAG: Lymphedema  THERAPY DIAG:  Malignant neoplasm of upper-outer quadrant of right breast in female, estrogen receptor positive (HCC)  Lymphedema, not elsewhere classified  Abnormal posture  Malignant neoplasm of upper-outer quadrant of left breast in female, estrogen receptor positive (HCC)  Stiffness of right shoulder, not elsewhere classified  Stiffness of left shoulder, not elsewhere classified  Metastasis to bone (HCC)  ONSET DATE: 1 month ago  Rationale for Evaluation and Treatment: Rehabilitation  SUBJECTIVE:                                                                                                                                                                                           SUBJECTIVE STATEMENT:   I was sick since last Monday and I didn't want to get you all sick. Today is my first day back to work. I think I have been doing really well. My ROM has been better overall. I haven't had much breast swelling recently.  I still didn't get a new compression bra, but I use the older bra and the compression tank top. I have been consistent with the Flexitouch and that helps. I had been exercising consistently until I got sick. I am in remission now. Next PET scan is in June. I feel like I can be discharged.  EVAL Bilateral axillary regions started swelling again,  and right breast greater than left breast, and both of my chest muscles  are hurting again. I started back to wearing my sports bra. I have been using my Flexi touch every day. For a little while the swelling feels better, but it always comes back. I need a new  compression bra. I have been working a lot, and I took myself off of night shift. I feel like work contributed to my swelling.  PERTINENT HISTORY:  Neoadjuvant chemotherapy with Doxorubicin and Cyclophosphamide given x 4 beginning 01/27/2021 and completing on 03/10/2021 followed by weekly paclitaxel x 12 beginning 05/2021 07/09/2021: Right lumpectomy: Grade 2 invasive lobular cancer, 2.5 cm, LCIS, margins negative, LCIS focally at anterior margin, 1/1 lymph node positive, ER 95%, PR 100%, HER2 negative, Ki-67 40% Left lumpectomy: High-grade DCIS: 0.7 cm, margins negative, 0/1 lymph node negative ER 95%, PR 100%. Pt now  STAGE IV Breast Cancer with bone mets throughout thoracic and lumbar regions and is s/p radiation to her lumbar spine. Pt. sees Palliative Care for chronic pain management.    PAIN:  Are you having pain? NO  PRECAUTIONS: Stage IV breast Cancer with bone METS, bilateral Breast lymphedema  RED FLAGS: Compression fracture: Yes: last year from bone METS    WEIGHT BEARING RESTRICTIONS: No  FALLS:  Has patient fallen in last 6 months? No  LIVING ENVIRONMENT: Lives with: lives with their family and lives alone Lives in: Other hotel Stairs: Yes; Internal: 15 steps; on left going up Has following equipment at home: NA  OCCUPATION: shift mgr for [employer]  LEISURE: shop, dominoes  HAND DOMINANCE: right   PRIOR LEVEL OF FUNCTION: Independent  PATIENT GOALS: Reduce swelling, reduce pain   OBJECTIVE: Note: Objective measures were completed at Evaluation unless otherwise noted.  COGNITION: Overall cognitive status: Within functional limits for tasks assessed   PALPATION: Very tender bilateral UT, pectorals, axilla and lateral trunk, medial and lateral breasts, Right medial upper arm  OBSERVATIONS / OTHER ASSESSMENTS: Slight cording noted in right axilla to medial upper arm, very sensitive to light touch,Fibrosis noted bilateral breasts medial and lateral, Right axilla with  increased swelling, mild peau d'orange right inferior breast with some enlarged pores bilaterally  SENSATION: Light touch: Deficits     POSTURE: Forward head, rounded shoulders  UPPER EXTREMITY AROM/PROM:  A/PROM RIGHT   eval  RIGHT 07/12/2023 RIGHT 08/08/2023  Shoulder extension 40, tight axilla 52 58  Shoulder flexion 140 +axilla 150 162  Shoulder abduction 147,+axilla 168 180  Shoulder internal rotation 35 + 67 70  Shoulder external rotation 85+ 95 100    (Blank rows = not tested)  A/PROM LEFT   eval LEFT 07/12/2023 LEFT 08/08/2023  Shoulder extension 47 54 56  Shoulder flexion 137 axilla 155 160  Shoulder abduction 146, axilla 170 180  Shoulder internal rotation Pain shoulder;held 55 70  Shoulder external rotation  98 100    (Blank rows = not tested)  CERVICAL AROM: All within functional limits:      UPPER EXTREMITY STRENGTH:   LYMPHEDEMA ASSESSMENTS:   SURGERY TYPE/DATE: 07/09/2021 BILATERAL BREAST LUMPECTOMY WITH RADIOACTIVE SEED LOCALIZATION RADIOACTIVE SEED GUIDED RIGHT AXILLARY TARGETED LYMPH NODE DISSECTION LEFT AXILLARY SENTINEL NODE BIOPSY LEFT AXILLARY LYMPH NODE MAPPING WITH SENTIMAG  NUMBER OF LYMPH NODES REMOVED: Right 1/1, Left 0/1   CHEMOTHERAPY: Neoadjuvant  RADIATION:Bilateral Breast and lumbar spine  HORMONE TREATMENT: YES  INFECTIONS: NO   LYMPHEDEMA ASSESSMENTS:   LANDMARK RIGHT  08/08/2023  At axilla    15 cm proximal to olecranon process   10 cm proximal to olecranon process 29.3  Olecranon process 25.6  15 cm proximal to ulnar styloid process   10 cm proximal to ulnar styloid process 21.4  Just proximal to ulnar styloid process 16.15  Across hand at thumb web space 20.4  At base of 2nd digit 6.6  (Blank rows = not tested)  LANDMARK LEFT  08/08/2023  At axilla    15 cm proximal to olecranon  process   10 cm proximal to olecranon process 29.8  Olecranon process 25.5  15 cm proximal to ulnar styloid process   10 cm  proximal to ulnar styloid process 20.4  Just proximal to ulnar styloid process 16.0  Across hand at thumb web space 20.0  At base of 2nd digit 6.5  (Blank rows = not tested)   FUNCTIONAL TESTS:    GAIT: Distance walked: front office to treatment room 8 Assistive device utilized: None Level of assistance: Complete Independence Comments: WNL   QUICK DASH SURVEY: 55%,  eval;   07/12/2023 45%, 08/08/2023: 0%  BREAST COMPLAINTS QUESTIONNAIRE Pain:7 Heaviness:7 Swollen feeling:10 Tense Skin:10 Redness:8 Bra Print:10 Size of Pores:10 Hard feeling: 10 Total:   72  /80 A Score over 9 indicates lymphedema issues in the breast BREAST COMPLAINTS QUESTIONNAIRE(08/08/2023) Pain:2 Heaviness:0 Swollen feeling:0 Tense Skin:2 Redness:0 Bra Print:2 Size of Pores:0 Hard feeling:0  Total:   6  /80 A Score over 9 indicates lymphedema issues in the breast     TODAY'S TREATMENT:                                                                                                                                          DATE:  08/08/2023 Discussed improvements and deficits for recert/DC Measured AROM and circumference of bilateral UE's ReviewedER x 10 goals Theraband exercises with red;scapular retraction x 10, +5, Extension 10+5, ER 10+ 5 Jobes flexion and scaption x 10 Discussed compression bras and the need to have them ongoing.  07/25/2023 Overhead pulleys flexion and abduction to loosen up. Ball rolls flexion x 10, abd x 5 Supine wand flexion and scaption x 5 ea Supine horizontal abd red band x 10, flexion red x 10, ER Bilateral x 10, Sword with thumb up x 10 ea PROM bilateral shoulder flexion, scaption, abduction, ER to restore fxl mobility 07/12/2023 Pulleys x 2 min flexion and abd;VC's as needed to prevent compensation Ball rolls on wall x 10 forward with end range stretch, x 5 bilateral abd;TC to prevent UT compensation Measured Bilateral shoulder AROM Assessed bilateral  breasts;mild fibrosis noted medially B, mild fibrosis around breast incision. No enlarged pores Pt is supine for MLD to right breast; Right supraclavicular LN's, Right inguinal LN's, 5 diaphragmatic breaths,Right axillo-inguinal pathway, and right breast directing to axi llo-inguinal pathway and repeating pathway. Pt was instructed in and practiced all steps with Visual and TC's . Pt reminded about importance of compression bra for breast lymphedema and she is going tomorrow to get a new bra.  06/29/2022 Wall Arc x 4 B Bilateral single arm pec stretch x 3 Pulleys flexion and abduction x 2:30 ea Ball rolls forward x 10 and lateral x 5 B Supine snow angels 5 Supine wand flex and scaption x 3 PROM bilateral shoulder flex, scaption, abduction, ER 06/21/2023 Wall arc x 3 B to stretch lat trunk on opposite side.  Supine wand exs x 3 flexion and scaption STM bilateral UT/pectorals, Lateral trunk with cocoa butter with multiple shortened areas especially laterally PROM bilateral shoulder flexion, scaption, abd, ER with VC's prn to relax Supine horizontal abd bilaterally x 5, scaption x 5  06/07/2023  Pulleys x 2 min flexion and abduction STM bilateral UT/pectorals, Lateral trunk with cocoa butter with multiple shortened areas especially laterally Supine wand flexion and scaption x 4 PROM bilateral shoulder flexion, scaption, abd, ER with VC's prn to relax Tried standing counter stretch but held secondary to LBP, Wall arc x 3 B Updated HEP with 4 post op exercises to perform 2x's per day, or may do supine wand in place of clasped hands flexion.  06/01/2023 Discussed treatment plan and interventions with pt including STM to decrease muscular tightness, stretching exercises for chest/shoulder to decrease muscle tightness, importance of proper compression bra (Gave pt new script), and return to MLD, CDT. Pt reviewed stargazer stretch and was reminded to start back to Abduction wall slides. Pt is not  wearing a proper sports bra or compression bra but will start back to one.    PATIENT EDUCATION:  Education details: Pt reminded about importance of compression, MLD and Flexitouch use and its importance to decrease breast swelling, gentle stretching of pecs and shoulder abduction Person educated: Patient Education method: Explanation Education comprehension: verbalized understanding and returned demonstration  HOME EXERCISE PROGRAM: Resume stargazer and wall slides for abd 2x/day 5 repetitions  ASSESSMENT:  CLINICAL IMPRESSION: Pt has achieved all goals established. She has made excellent progress with ROM and functional use of her Ue's., She has no visible or measurable  UE swelling. Breast Swelling has improved significantly since evaluation and she is independent and compliant with her Flexi touch and self MLD prn. She is going today to purchase new compression bras. She is released to independent self management. .  OBJECTIVE IMPAIRMENTS: decreased activity tolerance, decreased ROM, increased edema, impaired flexibility, impaired UE functional use, postural dysfunction, and pain.   ACTIVITY LIMITATIONS: carrying, lifting, sleeping, and reach over head  PARTICIPATION LIMITATIONS: cleaning and occupation  PERSONAL FACTORS: 3+ comorbidities: Bilateral breast cancer s/p surgery, chemo and radiation, now Stage IV Metastatic Breast Cancer with multiple bone METS in thoraacic and lumbar spine  are also affecting patient's functional outcome.   REHAB POTENTIAL: Good  CLINICAL DECISION MAKING: Stable/uncomplicated  EVALUATION COMPLEXITY: Low  GOALS: Goals reviewed with patient? Yes  SHORT TERM GOALS=LONG TERM GOALS: Target date: 07/12/2022  Pt will be independent in self MLD to decrease breast swelling Baseline: Goal status: MET 2.  Pt will have breast complaints survey no greater than 20% to demonstrate improved swelling and decreased pain Baseline:  Goal status: MET  08/08/2023 3.  Pt will report breast swelling improved by atleast 35-50% Baseline:  Goal status: MET 08/08/2023 4.  Pt will improve bilateral shoulder AROM to 150 degrees for improved reaching Baseline:  Goal status: MET 07/12/2023 5.  Pt will improve bilateral shoulder abduction to 167 degrees for improved ability to work overhead Baseline:  Goal status: MET 07/12/2023 6.  Quick dash will improve to no greater than 20% to demonstrate improved function Baseline: 55% Goal status:MET 0% 7.  Pt will be compliant with appropriate compression bra to decrease swelling  Goal Status:MET;  wearing older compression bra but is going  to get a new compression bra  PLAN:  PT FREQUENCY: 2x/week  PT DURATION: 4 wks  PLANNED INTERVENTIONS: therapeutic Exercises, Manual Therapy, Self Care, Dry Needling  PLAN FOR NEXT SESSION: Pt is discharged to continue independent self management PHYSICAL THERAPY DISCHARGE SUMMARY  Visits from Start of Care: 7  Current functional level related to goals / functional outcomes: Achieved all goals   Remaining deficits: Mild intermittent breast swelling   Education / Equipment: HEP, MLD   Patient agrees to discharge. Patient goals were met. Patient is being discharged due to meeting the stated rehab goals.  Waynette Buttery, PT 08/08/2023, 11:52 AM

## 2023-08-10 ENCOUNTER — Ambulatory Visit: Payer: 59

## 2023-08-11 ENCOUNTER — Other Ambulatory Visit (HOSPITAL_COMMUNITY): Payer: Self-pay

## 2023-08-12 ENCOUNTER — Other Ambulatory Visit (HOSPITAL_COMMUNITY): Payer: Self-pay

## 2023-08-19 ENCOUNTER — Other Ambulatory Visit (HOSPITAL_COMMUNITY): Payer: Self-pay

## 2023-08-19 ENCOUNTER — Other Ambulatory Visit: Payer: Self-pay | Admitting: Nurse Practitioner

## 2023-08-19 ENCOUNTER — Other Ambulatory Visit: Payer: Self-pay

## 2023-08-19 DIAGNOSIS — Z515 Encounter for palliative care: Secondary | ICD-10-CM

## 2023-08-19 DIAGNOSIS — C7951 Secondary malignant neoplasm of bone: Secondary | ICD-10-CM

## 2023-08-19 DIAGNOSIS — Z170 Estrogen receptor positive status [ER+]: Secondary | ICD-10-CM

## 2023-08-19 DIAGNOSIS — G893 Neoplasm related pain (acute) (chronic): Secondary | ICD-10-CM

## 2023-08-19 DIAGNOSIS — C50411 Malignant neoplasm of upper-outer quadrant of right female breast: Secondary | ICD-10-CM

## 2023-08-19 MED ORDER — OXYCODONE-ACETAMINOPHEN 7.5-325 MG PO TABS
1.0000 | ORAL_TABLET | Freq: Four times a day (QID) | ORAL | 0 refills | Status: DC | PRN
  Filled 2023-08-19: qty 102, 13d supply, fill #0

## 2023-08-23 ENCOUNTER — Other Ambulatory Visit (HOSPITAL_COMMUNITY): Payer: Self-pay

## 2023-08-24 ENCOUNTER — Other Ambulatory Visit (HOSPITAL_COMMUNITY): Payer: Self-pay

## 2023-08-29 ENCOUNTER — Other Ambulatory Visit: Payer: Self-pay | Admitting: Nurse Practitioner

## 2023-08-29 ENCOUNTER — Other Ambulatory Visit (HOSPITAL_COMMUNITY): Payer: Self-pay

## 2023-08-29 ENCOUNTER — Telehealth: Payer: Self-pay

## 2023-08-29 DIAGNOSIS — C50411 Malignant neoplasm of upper-outer quadrant of right female breast: Secondary | ICD-10-CM

## 2023-08-29 DIAGNOSIS — G893 Neoplasm related pain (acute) (chronic): Secondary | ICD-10-CM

## 2023-08-29 DIAGNOSIS — Z515 Encounter for palliative care: Secondary | ICD-10-CM

## 2023-08-29 DIAGNOSIS — C7951 Secondary malignant neoplasm of bone: Secondary | ICD-10-CM

## 2023-08-29 MED ORDER — OXYCODONE-ACETAMINOPHEN 7.5-325 MG PO TABS
1.0000 | ORAL_TABLET | Freq: Four times a day (QID) | ORAL | 0 refills | Status: DC | PRN
  Filled 2023-08-29 – 2023-08-30 (×2): qty 102, 13d supply, fill #0

## 2023-08-29 MED ORDER — OXYCODONE HCL ER 10 MG PO T12A
10.0000 mg | EXTENDED_RELEASE_TABLET | Freq: Two times a day (BID) | ORAL | 0 refills | Status: DC
Start: 2023-08-29 — End: 2023-10-18
  Filled 2023-08-29 – 2023-09-25 (×4): qty 60, 30d supply, fill #0

## 2023-08-29 NOTE — Telephone Encounter (Signed)
 Pt called for refill of percocet, and to report that she has been unable to get her MS Contin filled as it is on backorder. Attempted to get another long acting pain medication approved by her insurance per Lowella Bandy, NP order. Medication coverage was denied. Pt updated on appeal process vs. Paying out of pocket, vs waiting for medication to become back in stock. Pt verbalized understanding. No further needs at this time.

## 2023-08-30 ENCOUNTER — Inpatient Hospital Stay: Payer: MEDICAID | Attending: Adult Health

## 2023-08-30 ENCOUNTER — Inpatient Hospital Stay (HOSPITAL_BASED_OUTPATIENT_CLINIC_OR_DEPARTMENT_OTHER): Payer: MEDICAID | Admitting: Hematology and Oncology

## 2023-08-30 ENCOUNTER — Other Ambulatory Visit: Payer: Self-pay

## 2023-08-30 ENCOUNTER — Other Ambulatory Visit (HOSPITAL_COMMUNITY): Payer: Self-pay

## 2023-08-30 ENCOUNTER — Inpatient Hospital Stay: Payer: MEDICAID

## 2023-08-30 VITALS — BP 138/82 | HR 72 | Temp 97.7°F | Resp 18 | Ht 71.65 in | Wt 204.6 lb

## 2023-08-30 DIAGNOSIS — R110 Nausea: Secondary | ICD-10-CM | POA: Diagnosis not present

## 2023-08-30 DIAGNOSIS — C7951 Secondary malignant neoplasm of bone: Secondary | ICD-10-CM | POA: Diagnosis not present

## 2023-08-30 DIAGNOSIS — C50411 Malignant neoplasm of upper-outer quadrant of right female breast: Secondary | ICD-10-CM | POA: Diagnosis not present

## 2023-08-30 DIAGNOSIS — C50412 Malignant neoplasm of upper-outer quadrant of left female breast: Secondary | ICD-10-CM | POA: Diagnosis present

## 2023-08-30 DIAGNOSIS — Z170 Estrogen receptor positive status [ER+]: Secondary | ICD-10-CM | POA: Insufficient documentation

## 2023-08-30 DIAGNOSIS — D649 Anemia, unspecified: Secondary | ICD-10-CM | POA: Diagnosis not present

## 2023-08-30 DIAGNOSIS — Z79811 Long term (current) use of aromatase inhibitors: Secondary | ICD-10-CM | POA: Insufficient documentation

## 2023-08-30 DIAGNOSIS — Z95828 Presence of other vascular implants and grafts: Secondary | ICD-10-CM

## 2023-08-30 LAB — CBC WITH DIFFERENTIAL (CANCER CENTER ONLY)
Abs Immature Granulocytes: 0.03 10*3/uL (ref 0.00–0.07)
Basophils Absolute: 0 10*3/uL (ref 0.0–0.1)
Basophils Relative: 1 %
Eosinophils Absolute: 0.1 10*3/uL (ref 0.0–0.5)
Eosinophils Relative: 2 %
HCT: 34.4 % — ABNORMAL LOW (ref 36.0–46.0)
Hemoglobin: 10.9 g/dL — ABNORMAL LOW (ref 12.0–15.0)
Immature Granulocytes: 1 %
Lymphocytes Relative: 19 %
Lymphs Abs: 1 10*3/uL (ref 0.7–4.0)
MCH: 24.1 pg — ABNORMAL LOW (ref 26.0–34.0)
MCHC: 31.7 g/dL (ref 30.0–36.0)
MCV: 75.9 fL — ABNORMAL LOW (ref 80.0–100.0)
Monocytes Absolute: 0.6 10*3/uL (ref 0.1–1.0)
Monocytes Relative: 12 %
Neutro Abs: 3.3 10*3/uL (ref 1.7–7.7)
Neutrophils Relative %: 65 %
Platelet Count: 256 10*3/uL (ref 150–400)
RBC: 4.53 MIL/uL (ref 3.87–5.11)
RDW: 16.1 % — ABNORMAL HIGH (ref 11.5–15.5)
WBC Count: 5 10*3/uL (ref 4.0–10.5)
nRBC: 0 % (ref 0.0–0.2)

## 2023-08-30 LAB — CMP (CANCER CENTER ONLY)
ALT: 18 U/L (ref 0–44)
AST: 19 U/L (ref 15–41)
Albumin: 4.3 g/dL (ref 3.5–5.0)
Alkaline Phosphatase: 74 U/L (ref 38–126)
Anion gap: 5 (ref 5–15)
BUN: 12 mg/dL (ref 6–20)
CO2: 31 mmol/L (ref 22–32)
Calcium: 8.7 mg/dL — ABNORMAL LOW (ref 8.9–10.3)
Chloride: 105 mmol/L (ref 98–111)
Creatinine: 0.76 mg/dL (ref 0.44–1.00)
GFR, Estimated: >60 mL/min (ref >60)
Glucose, Bld: 97 mg/dL (ref 70–99)
Potassium: 3.7 mmol/L (ref 3.5–5.1)
Sodium: 141 mmol/L (ref 135–145)
Total Bilirubin: 0.4 mg/dL (ref 0.0–1.2)
Total Protein: 7.5 g/dL (ref 6.5–8.1)

## 2023-08-30 MED ORDER — DENOSUMAB 120 MG/1.7ML ~~LOC~~ SOLN
120.0000 mg | Freq: Once | SUBCUTANEOUS | Status: AC
  Administered 2023-08-30: 120 mg via SUBCUTANEOUS
  Filled 2023-08-30: qty 1.7

## 2023-08-30 NOTE — Assessment & Plan Note (Signed)
 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  ----------------------------------------------------------------------------------------------------------------------------- Current treatment: Verzinio (started 05/21/2022) with anastrozole Verzinio dose: 50 p.o. twice daily Toxicities: Nausea: Taking premedications   06/02/2022: PET CT scan: No FDG activity in the multifocal sclerotic and mixed lytic and sclerotic lesions involving the spine, ribs, sacrum, pelvic bones and bilateral proximal femurs. 11/21/2022: CT CAP: Bone mets unchanged, 5 mm right lobe of the liver: Stable sigmoid colon: Mild diverticulitis 11/21/2022: Bone scan: Mild activity at L3 vertebral body compression fracture, stable 12/15/2022: MRI lumbar spine: Stable pathologic fracture L3 stable other bone mets 06/10/2023: PET/CT: Interval development of hypermetabolic lymphadenopathy left submandibular region, throughout mediastinal, bilateral hilar, upper abdomen (no uptake in the bones) 07/06/2023: Submandibular lymph node biopsy: Benign   Next PET CT scan in 3 months and follow-up after lab

## 2023-08-30 NOTE — Progress Notes (Signed)
 error

## 2023-08-30 NOTE — Progress Notes (Signed)
 Patient Care Team: Grayce Sessions, NP as PCP - General (Internal Medicine) Thomasene Ripple, DO as PCP - Cardiology (Cardiology) Antony Blackbird, MD as Consulting Physician (Radiation Oncology) Abigail Miyamoto, MD as Consulting Physician (General Surgery) Serena Croissant, MD as Consulting Physician (Hematology and Oncology) [name], Arty Baumgartner, NP as Nurse Practitioner (Nurse Practitioner)  DIAGNOSIS:  Encounter Diagnosis  Name Primary?   Malignant neoplasm of upper-outer quadrant of right breast in female, estrogen receptor positive (HCC) Yes    SUMMARY OF ONCOLOGIC HISTORY: Oncology History  Malignant neoplasm of upper-outer quadrant of right breast in female, estrogen receptor positive (HCC)  12/12/2020 Initial Diagnosis   status post bilateral breast biopsies 12/03/2020, showing             (1) on the right, a clinical T2 N1, stage IIa invasive lobular carcinoma, grade 2, estrogen and progesterone receptor strongly positive, HER2 not amplified, with an MIB-1 of 40%                         (a) the biopsied right axillary lymph node was positive with extracapsular extension                         (b) a second right breast mass also biopsied was a fibroadenoma, concordant  MAMMAPRINT tested on biopsy returned high risk, luminal type B, indicating significant benefit from chemo                         (c) biopsy of an area of non-mass-like enhancement in the upper right breast pending   12/17/2020 Cancer Staging   Staging form: Breast, AJCC 8th Edition - Clinical stage from 12/17/2020: Stage IIA (cT2, cN1, cM0, G2, ER+, PR+, HER2-) - Signed by Loa Socks, NP on 01/14/2022 Stage prefix: Initial diagnosis Histologic grading system: 3 grade system Laterality: Right Staged by: Pathologist and managing physician Stage used in treatment planning: Yes National guidelines used in treatment planning: Yes Type of national guideline used in treatment planning: NCCN    12/24/2020 Genetic Testing   Negative genetic testing:  No pathogenic variants detected on the [facility] BRCAplus panel (report date 12/24/2020) or the CancerNext-Expanded + RNAinsight panel (report date 12/31/2020). 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   The BRCAplus panel offered by [employer] and includes sequencing and deletion/duplication analysis for the following 8 genes: ATM, BRCA1, BRCA2, CDH1, CHEK2, PALB2, PTEN, and TP53. The CancerNext-Expanded + RNAinsight gene panel offered by [employer] and includes sequencing and rearrangement analysis for the following 77 genes: AIP, ALK, APC, ATM, AXIN2, BAP1, BARD1, BLM, BMPR1A, BRCA1, BRCA2, BRIP1, CDC73, CDH1, CDK4, CDKN1B, CDKN2A, CHEK2, CTNNA1, DICER1, FANCC, FH, FLCN, GALNT12, KIF1B, LZTR1, MAX, MEN1, MET, MLH1, MSH2, MSH3, MSH6, MUTYH, NBN, NF1, NF2, NTHL1, PALB2, PHOX2B, PMS2, POT1, PRKAR1A, PTCH1, PTEN, RAD51C, RAD51D, RB1, RECQL, RET, SDHA, SDHAF2, SDHB, SDHC, SDHD, SMAD4, SMARCA4, SMARCB1, SMARCE1, STK11, SUFU, TMEM127, TP53, TSC1, TSC2, VHL and XRCC2 (sequencing and deletion/duplication); EGFR, EGLN1, HOXB13, KIT, MITF, PDGFRA, POLD1 and POLE (sequencing only); EPCAM and GREM1 (deletion/duplication only). RNA data is routinely analyzed for use in variant interpretation for all genes.   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 Multifocal sclerotic bony metastatic disease greatest in the thoracic spine with scattered rib lesions and sclerosis as well. 2. No nodal or visceral metastasis about the chest. 3. Collateral pathways about the chest suggest some subclavian venous narrowing on the LEFT. This could also be related to arm position. Correlate clinically. 4. Signs of RIGHT breast lumpectomy. Potential postoperative changes in the LEFT breast with small focal area of nodularity in the lateral LEFT breast which could also be postoperative, correlate clinically and consider mammographic correlation as warranted. 5. Mild cardiac enlargement.   05/18/2022 Treatment Plan Change   Anastrozole + Verzenio 100mg  PO BID   12/07/2022 Cancer Staging   Staging form: Breast, AJCC 8th Edition - Pathologic: Stage IV (cM1) - Signed by Loa Socks, NP on 12/07/2022   Malignant neoplasm of upper-outer quadrant of left breast in female, estrogen receptor positive (HCC)  12/12/2020 Initial Diagnosis   on the left, a clinical T1b N0, stage IA invasive ductal carcinoma, grade 1 or 2, estrogen and progesterone receptor positive, HER2 not amplified, with an MIB 1 of 15%   12/17/2020 Cancer Staging   Staging form: Breast, AJCC 8th Edition - Clinical stage from 12/17/2020: Stage IA (cT1b, cN0, cM0, G2, ER+, PR+, HER2-) - Signed by Loa Socks, NP on 01/14/2022 Stage prefix: Initial diagnosis Histologic grading system: 3 grade system Laterality: Left Staged by: Pathologist and managing physician Stage used in treatment  planning: Yes National guidelines used in treatment planning: Yes Type of national guideline used in treatment planning: NCCN   12/24/2020 Genetic Testing   Negative genetic testing:  No pathogenic variants detected on the [facility] BRCAplus panel (report date 12/24/2020) or the CancerNext-Expanded + RNAinsight panel (report date 12/31/2020). 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   The BRCAplus panel offered by [employer] and includes sequencing and deletion/duplication analysis for the following 8 genes: ATM, BRCA1, BRCA2, CDH1, CHEK2, PALB2, PTEN, and TP53. The CancerNext-Expanded + RNAinsight gene panel offered by [employer] and includes sequencing and rearrangement analysis for the following 77 genes: AIP, ALK, APC, ATM, AXIN2, BAP1, BARD1, BLM, BMPR1A, BRCA1, BRCA2, BRIP1, CDC73, CDH1, CDK4, CDKN1B, CDKN2A, CHEK2, CTNNA1, DICER1, FANCC, FH, FLCN, GALNT12, KIF1B, LZTR1, MAX, MEN1, MET, MLH1, MSH2, MSH3, MSH6, MUTYH, NBN, NF1, NF2, NTHL1, PALB2, PHOX2B, PMS2, POT1, PRKAR1A, PTCH1, PTEN, RAD51C, RAD51D, RB1, RECQL, RET, SDHA, SDHAF2, SDHB, SDHC, SDHD, SMAD4, SMARCA4, SMARCB1, SMARCE1, STK11, SUFU, TMEM127, TP53, TSC1, TSC2, VHL and XRCC2 (sequencing and deletion/duplication); EGFR, EGLN1, HOXB13, KIT, MITF, PDGFRA, POLD1 and POLE (sequencing only); EPCAM and GREM1 (deletion/duplication only). RNA data is routinely analyzed for use in variant interpretation for all genes.   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 Multifocal sclerotic bony metastatic disease greatest in the thoracic spine with scattered rib lesions and sclerosis as well. 2. No nodal or visceral metastasis about the chest. 3. Collateral pathways about the chest suggest some subclavian venous narrowing on the LEFT. This could also be related to arm position. Correlate clinically. 4. Signs of RIGHT breast lumpectomy. Potential postoperative changes in the LEFT breast with small focal area of nodularity in the lateral LEFT breast which could also be postoperative, correlate clinically and consider mammographic correlation as warranted. 5. Mild cardiac enlargement.   05/18/2022 Treatment Plan Change   Anastrozole + Verzenio 100mg  PO BID   06/23/2022 - 07/07/2022 Radiation Therapy   06/23/2022 through 07/07/2022 Site Technique Total Dose (Gy) Dose per Fx (Gy) Completed Fx Beam Energies  Lumbar Spine: Spine_LS_Pelv 3D 30/30 3 10/10 15X       CHIEF COMPLIANT: Follow-up on Verzinio  HISTORY OF PRESENT ILLNESS:  History of Present Illness The patient, with a history of cancer, is currently in remission and has been on treatment for a year and four months. She reports no side effects from the treatment. The patient has a history of mild anemia, which is improving, with a current hemoglobin of 10.9. She reports variable energy levels, with some days feeling tired and others feeling more energetic. She denies any bowel issues such as diarrhea.     ALLERGIES:  has no  known allergies.  MEDICATIONS:  Current Outpatient Medications  Medication Sig Dispense Refill   abemaciclib (VERZENIO) 50 MG tablet Take 1 tablet (50 mg total) by mouth 2 (two) times daily. 56 tablet 3   albuterol (VENTOLIN HFA) 108 (90 Base) MCG/ACT inhaler Inhale 2 puffs by mouth into the lungs every 6 hours as needed for wheezing or shortness of breath. 18 g 2   amLODipine (NORVASC) 5 MG tablet Take 1 tablet (5 mg total) by mouth daily. 90 tablet 0   anastrozole (ARIMIDEX) 1 MG tablet Take 1 tablet (1 mg total) by mouth daily. 90 tablet 3   calcium-vitamin D (OSCAL WITH D) 500-5 MG-MCG tablet Take 1 tablet by mouth daily with supper. 90 tablet 2   gabapentin (NEURONTIN) 300 MG capsule Take 1 capsule (300 mg total) by mouth 3 (three) times daily. 90 capsule 6   montelukast (SINGULAIR) 10 MG tablet Take 1 tablet (10 mg total) by mouth at bedtime. 90 tablet 3   oxyCODONE (OXYCONTIN) 10 mg 12 hr tablet Take 1 tablet (10 mg total) by mouth every 12 (twelve) hours. 60 tablet 0   oxyCODONE-acetaminophen (PERCOCET) 7.5-325 MG tablet Take 1-2 tablets by mouth every 6 (six) hours as needed  for severe pain (pain score 7-10). 102 tablet 0   pantoprazole (PROTONIX) 40 MG tablet Take 1 tablet (40 mg total) by mouth daily. 30 tablet 3   polyethylene glycol (MIRALAX / GLYCOLAX) 17 g packet Take 17 g by mouth 2 (two) times daily. 60 each 3   prochlorperazine (COMPAZINE) 10 MG tablet Take 1 tablet (10 mg total) by mouth every 6 (six) hours as needed for nausea or vomiting. 30 tablet 0   propranolol (INDERAL) 10 MG tablet Take 1 tablet (10 mg total) by mouth 2 (two) times daily. Please call [phone] to schedule an appointment for future refills. Thank you. 3rd attempt. 30 tablet 0   senna (SENOKOT) 8.6 MG TABS tablet Take 2 tablets (17.2 mg total) by mouth at bedtime. 60 tablet 3   No current facility-administered medications for this visit.    PHYSICAL EXAMINATION: ECOG PERFORMANCE STATUS: 1 -  Symptomatic but completely ambulatory  Vitals:   08/30/23 1357  BP: 138/82  Pulse: 72  Resp: 18  Temp: 97.7 F (36.5 C)  SpO2: 100%   Filed Weights   08/30/23 1357  Weight: 204 lb 9.6 oz (92.8 kg)    Physical Exam   (exam performed in the presence of a chaperone)  LABORATORY DATA:  I have reviewed the data as listed    Latest Ref Rng & Units 08/30/2023    1:25 PM 05/26/2023   11:07 AM 02/24/2023    1:56 PM  CMP  Glucose 70 - 99 mg/dL 97  94  91   BUN 6 - 20 mg/dL 12  17  13    Creatinine 0.44 - 1.00 mg/dL 5.28  4.13  2.44   Sodium 135 - 145 mmol/L 141  140  141   Potassium 3.5 - 5.1 mmol/L 3.7  3.9  3.8   Chloride 98 - 111 mmol/L 105  104  106   CO2 22 - 32 mmol/L 31  32  31   Calcium 8.9 - 10.3 mg/dL 8.7  9.4  8.7   Total Protein 6.5 - 8.1 g/dL 7.5  7.1  6.9   Total Bilirubin 0.0 - 1.2 mg/dL 0.4  0.3  0.4   Alkaline Phos 38 - 126 U/L 74  65  52   AST 15 - 41 U/L 19  17  18    ALT 0 - 44 U/L 18  17  17      Lab Results  Component Value Date   WBC 5.0 08/30/2023   HGB 10.9 (L) 08/30/2023   HCT 34.4 (L) 08/30/2023   MCV 75.9 (L) 08/30/2023   PLT 256 08/30/2023   NEUTROABS 3.3 08/30/2023    ASSESSMENT & PLAN:  Malignant neoplasm of upper-outer quadrant of right breast in female, estrogen receptor positive (HCC) 12/03/2020: Bilateral breast biopsies: Right breast: T2N1 stage IIa grade 2 ILC, ER/PR positive, HER2 negative, Ki-67 40%, right axillary lymph node positive with extracapsular extension MammaPrint: High risk   01/27/2021 -06/08/2021 neoadjuvant chemotherapy with dose dense Adriamycin and Cytoxan x4 followed by Taxol x11   07/09/2021:Right lumpectomy: Grade 2 invasive lobular cancer, 2.5 cm, LCIS, margins negative, LCIS focally at anterior margin, 1/1 lymph node positive, ER 95%, PR 100%, HER2 negative, Ki-67 40% 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  ----------------------------------------------------------------------------------------------------------------------------- Current treatment: Verzinio (started 05/21/2022) with anastrozole  Verzinio dose: 50 p.o. twice daily Toxicities: Nausea: Taking premedications   06/02/2022: PET CT scan: No FDG activity in the multifocal sclerotic and mixed lytic and sclerotic lesions involving the spine, ribs, sacrum, pelvic bones and bilateral proximal femurs. 11/21/2022: CT CAP: Bone mets unchanged, 5 mm right lobe of the liver: Stable sigmoid colon: Mild diverticulitis 11/21/2022: Bone scan: Mild activity at L3 vertebral body compression fracture, stable 12/15/2022: MRI lumbar spine: Stable pathologic fracture L3 stable other bone mets 06/10/2023: PET/CT: Interval development of hypermetabolic lymphadenopathy left submandibular region, throughout mediastinal, bilateral hilar, upper abdomen (no uptake in the bones) 07/06/2023: Submandibular lymph node biopsy: Benign   Next PET CT scan in 3 months and follow-up after lab  ------------------------------------- Assessment and Plan Assessment & Plan Malignant neoplasm of upper-outer quadrant of right breast, estrogen receptor positive Remains in remission after 16 months. No side effects from current regimen. Hemoglobin improved, mild anemia resolved. Normal WBC, electrolytes, kidney, and liver function. Slight hypocalcemia. - Schedule PET CT scan in three months to monitor remission status. - Continue current medication regimen including Abemaciclib and Anastrozole. - Encourage dietary intake of greens, meats, beans, peas, and dairy to improve hemoglobin and calcium levels. - Continue injections every three months.  Hypocalcemia Slightly low calcium levels, asymptomatic. - Encourage dietary intake of calcium-rich foods such as dairy products, eggs, and  greens.      No orders of the defined types were placed in this encounter.  The patient has a good understanding of the overall plan. she agrees with it. she will call with any problems that may develop before the next visit here. Total time spent: 30 mins including face to face time and time spent for planning, charting and co-ordination of care   Tamsen Meek, MD 08/30/23

## 2023-08-31 ENCOUNTER — Other Ambulatory Visit (HOSPITAL_COMMUNITY): Payer: Self-pay

## 2023-09-07 ENCOUNTER — Inpatient Hospital Stay: Payer: MEDICAID

## 2023-09-07 ENCOUNTER — Other Ambulatory Visit (HOSPITAL_COMMUNITY): Payer: Self-pay

## 2023-09-09 ENCOUNTER — Other Ambulatory Visit (HOSPITAL_COMMUNITY): Payer: Self-pay

## 2023-09-12 ENCOUNTER — Other Ambulatory Visit (HOSPITAL_COMMUNITY): Payer: Self-pay

## 2023-09-12 ENCOUNTER — Inpatient Hospital Stay (HOSPITAL_BASED_OUTPATIENT_CLINIC_OR_DEPARTMENT_OTHER): Payer: MEDICAID | Admitting: Nurse Practitioner

## 2023-09-12 ENCOUNTER — Encounter: Payer: Self-pay | Admitting: Nurse Practitioner

## 2023-09-12 DIAGNOSIS — C7951 Secondary malignant neoplasm of bone: Secondary | ICD-10-CM | POA: Diagnosis not present

## 2023-09-12 DIAGNOSIS — Z170 Estrogen receptor positive status [ER+]: Secondary | ICD-10-CM

## 2023-09-12 DIAGNOSIS — G893 Neoplasm related pain (acute) (chronic): Secondary | ICD-10-CM | POA: Diagnosis not present

## 2023-09-12 DIAGNOSIS — Z515 Encounter for palliative care: Secondary | ICD-10-CM

## 2023-09-12 DIAGNOSIS — C50411 Malignant neoplasm of upper-outer quadrant of right female breast: Secondary | ICD-10-CM

## 2023-09-12 DIAGNOSIS — K5903 Drug induced constipation: Secondary | ICD-10-CM

## 2023-09-12 MED ORDER — OXYCODONE-ACETAMINOPHEN 7.5-325 MG PO TABS
1.0000 | ORAL_TABLET | Freq: Four times a day (QID) | ORAL | 0 refills | Status: DC | PRN
  Filled 2023-09-12: qty 102, 13d supply, fill #0

## 2023-09-12 NOTE — Progress Notes (Signed)
 Palliative Medicine [facility]  [phone] [phone]   Name: Chelsea Hansen Date: 09/12/2023 MRN: [id number]  DOB: [birth year]  Patient Care Team: Grayce Sessions, NP as PCP - General (Internal Medicine) Thomasene Ripple, DO as PCP - Cardiology (Cardiology) Antony Blackbird, MD as Consulting Physician (Radiation Oncology) Abigail Miyamoto, MD as Consulting Physician (General Surgery) Serena Croissant, MD as Consulting Physician (Hematology and Oncology) [name], Arty Baumgartner, NP as Nurse Practitioner (Nurse Practitioner)   I connected with Chelsea Hansen on 09/12/23 at  3:00 PM EDT by phone and verified that I am speaking with the correct person using two identifiers.   I discussed the limitations, risks, security and privacy concerns of performing an evaluation and management service by telemedicine and the availability of in-person appointments. I also discussed with the patient that there may be a patient responsible charge related to this service. The patient expressed understanding and agreed to proceed.   Other persons participating in the visit and their role in the encounter: n/a   Patient's location: home  Provider's location: [facility]   Chief Complaint: follow up of symptom management    INTERVAL HISTORY: Chelsea Hansen is a 53 y.o. 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  MRI of L-spine (12/23) showed L3 pathologic fracture and significant lesion along S1 with sclerotic changes.  Palliative ask to see for symptom management and goals of care.   SOCIAL HISTORY:     reports that she has never smoked. She has never used smokeless  tobacco. She reports that she does not currently use alcohol after a past usage of about 7.0 standard drinks of alcohol per week. She reports that she does not use drugs.  ADVANCE DIRECTIVES:   CODE STATUS:   PAST MEDICAL HISTORY: Past Medical History:  Diagnosis Date   Allergy    seasonal allergies   Anxiety    on meds   Asthma    uses inhaler   Breast cancer (HCC) 2012   RIGHT lumpectomy   Cancer (HCC) 2022   RIGHT breast lump-dx 2022   Depression    on meds   DVT (deep venous thrombosis) (HCC) 2010   after hysterectomy   Family history of uterine cancer    GERD (gastroesophageal reflux disease)    with certain foods/OTC PRN meds   Headache(784.0)    History of radiation therapy    Bilateral breast- 09/03/21-11/02/21- Dr. Antony Blackbird   Hyperlipidemia    on meds   Hypertension    on meds   SVT (supraventricular tachycardia) (HCC)     ALLERGIES:  has no known allergies.  MEDICATIONS:  Current Outpatient Medications  Medication Sig Dispense Refill   abemaciclib (VERZENIO) 50 MG tablet Take 1 tablet (50 mg total) by mouth 2 (two) times daily. 56 tablet 3   albuterol (VENTOLIN HFA) 108 (90 Base) MCG/ACT inhaler Inhale 2 puffs by mouth into the lungs every 6 hours as needed for wheezing or shortness of breath. 18 g 2   amLODipine (NORVASC) 5 MG tablet Take 1 tablet (5 mg total) by mouth daily. 90 tablet 0   anastrozole (ARIMIDEX) 1 MG tablet Take 1 tablet (1 mg total) by mouth daily. 90 tablet 3   calcium-vitamin D (OSCAL  WITH D) 500-5 MG-MCG tablet Take 1 tablet by mouth daily with supper. 90 tablet 2   gabapentin (NEURONTIN) 300 MG capsule Take 1 capsule (300 mg total) by mouth 3 (three) times daily. 90 capsule 6   montelukast (SINGULAIR) 10 MG tablet Take 1 tablet (10 mg total) by mouth at bedtime. 90 tablet 3   oxyCODONE (OXYCONTIN) 10 mg 12 hr tablet Take 1 tablet (10 mg total) by mouth every 12 (twelve) hours. (Patient not taking: Reported on 08/30/2023) 60 tablet 0    oxyCODONE-acetaminophen (PERCOCET) 7.5-325 MG tablet Take 1-2 tablets by mouth every 6 (six) hours as needed for severe pain (pain score 7-10). 102 tablet 0   pantoprazole (PROTONIX) 40 MG tablet Take 1 tablet (40 mg total) by mouth daily. 30 tablet 3   polyethylene glycol (MIRALAX / GLYCOLAX) 17 g packet Take 17 g by mouth 2 (two) times daily. 60 each 3   prochlorperazine (COMPAZINE) 10 MG tablet Take 1 tablet (10 mg total) by mouth every 6 (six) hours as needed for nausea or vomiting. 30 tablet 0   propranolol (INDERAL) 10 MG tablet Take 1 tablet (10 mg total) by mouth 2 (two) times daily. Please call [phone] to schedule an appointment for future refills. Thank you. 3rd attempt. 30 tablet 0   senna (SENOKOT) 8.6 MG TABS tablet Take 2 tablets (17.2 mg total) by mouth at bedtime. 60 tablet 3   No current facility-administered medications for this visit.    VITAL SIGNS: There were no vitals taken for this visit. There were no vitals filed for this visit.   Estimated body mass index is 28.02 kg/m as calculated from the following:   Height as of 08/30/23: 5' 11.65" (1.82 m).   Weight as of 08/30/23: 204 lb 9.6 oz (92.8 kg).   PERFORMANCE STATUS (ECOG) : 1 - Symptomatic but completely ambulatory   IMPRESSION: I connected by phone with [name] for follow-up. No acute distress. She recently returned from a trip to [place]. Denies concerns with nausea, vomiting, constipation, or diarrhea. Is remaining active. Working. Occasional fatigue.  Constipation is controlled with senna S2 tablets at bedtime and daily MiraLAX.   Chelsea Hansen reports her pain is as controlled as it can be. She can tell a difference now that she is not taking long-acting medication. This has been due to insurance no longer covering and price increase. She is hopeful medication can be restarted and approved by her insurance in the future as she felt her pain was better managed. Currently some days are better than others.   When she does have a "bad "day she tends to spend this in the bed.  She reports today is not the best day due to the rain and most likely recent travel.  We will continue to work with insurance for medication approval.  In the meantime she will continue taking Percocet 1-2 tablets every 6 hours as needed for pain.  She is tolerating this without difficulty.  Refills remain appropriate.  PDMP reviewed.  Pain contract on file.  We will continue to closely monitor.  All questions answered and support provided.  PLAN: Will consider MS Contin versus OxyContin if insurance will approve and continues to deny Xtampza    Continue Percocet every 6 hours as needed for breakthrough pain. Have been taking more often due to absence of long-acting.  MiraLAX twice daily for bowel regimen Senna daily Protonix 40 mg daily  Patient reports pain is well-controlled. I will plan to see patient  back in 6-8 weeks in collaboration with her other oncology appointments. Patient knows to contact our office sooner if needed.  Patient expressed understanding and was in agreement with this plan. She also understands that She can call the clinic at any time with any questions, concerns, or complaints.     Any controlled substances utilized were prescribed in the context of palliative care. PDMP has been reviewed.    Visit consisted of counseling and education dealing with the complex and emotionally intense issues of symptom management and palliative care in the setting of serious and potentially life-threatening illness.  Willette Alma, AGPCNP-BC  Palliative Medicine Team/[facility]

## 2023-09-13 ENCOUNTER — Encounter: Payer: Self-pay | Admitting: Hematology and Oncology

## 2023-09-13 ENCOUNTER — Other Ambulatory Visit (HOSPITAL_COMMUNITY): Payer: Self-pay

## 2023-09-22 ENCOUNTER — Other Ambulatory Visit (HOSPITAL_COMMUNITY): Payer: Self-pay

## 2023-09-25 ENCOUNTER — Other Ambulatory Visit: Payer: Self-pay | Admitting: Nurse Practitioner

## 2023-09-25 ENCOUNTER — Other Ambulatory Visit: Payer: Self-pay | Admitting: Cardiology

## 2023-09-25 DIAGNOSIS — Z515 Encounter for palliative care: Secondary | ICD-10-CM

## 2023-09-25 DIAGNOSIS — C7951 Secondary malignant neoplasm of bone: Secondary | ICD-10-CM

## 2023-09-25 DIAGNOSIS — G893 Neoplasm related pain (acute) (chronic): Secondary | ICD-10-CM

## 2023-09-25 DIAGNOSIS — C50411 Malignant neoplasm of upper-outer quadrant of right female breast: Secondary | ICD-10-CM

## 2023-09-26 ENCOUNTER — Other Ambulatory Visit: Payer: Self-pay

## 2023-09-26 ENCOUNTER — Other Ambulatory Visit (HOSPITAL_COMMUNITY): Payer: Self-pay

## 2023-09-26 MED ORDER — OXYCODONE-ACETAMINOPHEN 7.5-325 MG PO TABS
1.0000 | ORAL_TABLET | Freq: Four times a day (QID) | ORAL | 0 refills | Status: DC | PRN
  Filled 2023-09-26: qty 102, 13d supply, fill #0

## 2023-09-27 NOTE — Telephone Encounter (Signed)
 Dr. Mallory Shirk pt. She was last seen in 03/2021. She is passed her 3rd attempt. Does Dr. Servando Salina want to refill? Please advise.

## 2023-09-28 ENCOUNTER — Other Ambulatory Visit: Payer: Self-pay

## 2023-09-29 ENCOUNTER — Other Ambulatory Visit: Payer: Self-pay

## 2023-10-03 ENCOUNTER — Other Ambulatory Visit: Payer: Self-pay

## 2023-10-03 ENCOUNTER — Encounter: Payer: Self-pay | Admitting: Hematology and Oncology

## 2023-10-03 NOTE — Progress Notes (Signed)
 Specialty Pharmacy Refill Coordination Note  Chelsea Hansen is a 53 y.o. female contacted today regarding refills of specialty medication(s) Abemaciclib (VERZENIO)   Patient requested [name] at [facility] at [place] date: 10/06/23   Medication will be filled on 04.16.25.

## 2023-10-05 ENCOUNTER — Other Ambulatory Visit: Payer: Self-pay

## 2023-10-06 ENCOUNTER — Encounter: Payer: Self-pay | Admitting: Hematology and Oncology

## 2023-10-09 ENCOUNTER — Other Ambulatory Visit: Payer: Self-pay | Admitting: Nurse Practitioner

## 2023-10-09 DIAGNOSIS — Z515 Encounter for palliative care: Secondary | ICD-10-CM

## 2023-10-09 DIAGNOSIS — G893 Neoplasm related pain (acute) (chronic): Secondary | ICD-10-CM

## 2023-10-09 DIAGNOSIS — C7951 Secondary malignant neoplasm of bone: Secondary | ICD-10-CM

## 2023-10-09 DIAGNOSIS — C50411 Malignant neoplasm of upper-outer quadrant of right female breast: Secondary | ICD-10-CM

## 2023-10-10 ENCOUNTER — Ambulatory Visit: Payer: 59 | Admitting: Licensed Clinical Social Worker

## 2023-10-10 ENCOUNTER — Other Ambulatory Visit (HOSPITAL_COMMUNITY): Payer: Self-pay

## 2023-10-10 ENCOUNTER — Other Ambulatory Visit: Payer: Self-pay

## 2023-10-10 MED ORDER — OXYCODONE-ACETAMINOPHEN 7.5-325 MG PO TABS
1.0000 | ORAL_TABLET | Freq: Four times a day (QID) | ORAL | 0 refills | Status: DC | PRN
  Filled 2023-10-10: qty 102, 13d supply, fill #0

## 2023-10-10 NOTE — Patient Instructions (Signed)
 Visit Information  Thank you for taking time to visit with me today. Please don't hesitate to contact me if I can be of assistance to you before our next scheduled appointment.  Client has LCSW name and phone number and can call LCSW as needed for SW support at [phone]  Please call the care guide team at [phone] if you need to cancel or reschedule your appointment.   Following is a copy of your care plan:   Goals Addressed             This Visit's Progress    VBCI Social Work Care Plan       Problems:   Receives treatment at [facility]. Has been receiving treatments at that [facility] for about 3 years           Pain issues.  Takes 2 medications for pain           Transport challenges            Sleeping challenges            Financial needs            Stress in managing medical needs  CSW Clinical Goal(s):   Over the next   30 days ,  the Patient will talk with LCSW about managing financial  needs of client AEB client report of better management of finances              Over next 30 days , patient will talk with LCSW about her use of coping skills to manage depression issues AEB client report of improvement in depression symptoms          .  Interventions:  Discussed medication procurement of client              Discussed client management of pain issues. She is taking pain medications as                prescribed            Discussed upcoming client appointments            Provided counseling support for client            Discussed family support for client             Discussed transport needs of client            Discussed financial needs of client. She receives Social Security Disability benefit              each month             Discussed sleeping challenges. She has reduced sleep             Discussed food scarcity issues. Talked with Sallyanne Creamer about [employer] and about Production manager.  Gave client phone number of Production manager. She plans  to call [employer] to discuss food resources with that group            Encouraged client to call LCSW as needed for SW support at [phone]            Client appreciative of call from LCSW today  Patient Goals/Self-Care Activities:  Client to attend scheduled medical appointments             Client to take medications as prescribed              Client to contact [employer] for food support information.  Client to contact [employer] for food resource information             Client to communicate with her family as needed to seek family support                Client to continue to look for alternate housing. She stays in a motel and said             that cost of motel  rent is getting expensive  Plan:   Client to call LCSW as needed for SW support at [phone]          Please go to [facility] [address] [place] [phone]) if you are experiencing a Mental Health or Behavioral Health Crisis or need someone to talk to.  The patient verbalized understanding of instructions, educational materials, and care plan provided today and DECLINED offer to receive copy of patient instructions, educational materials, and care plan.   Patient received information on how to contact Care team for support. LCSW provided Sallyanne Creamer with LCSW name and phone number for her to call for SW support as needed   Chelsea Hansen  MSW, LCSW [place] Licensed Clinical Social Worker Direct Dial:  [phone] Fax:  [phone] Website:  [email or web address]

## 2023-10-10 NOTE — Patient Outreach (Signed)
 Complex Care Management   Visit Note  10/10/2023  Name:  Chelsea Hansen MRN: [id number] DOB: 08/11/1970  Situation: Referral received for Complex Care Management related to  housing needs and management of medical needs  I obtained verbal consent from Patient.  Visit completed with patient  on the phone  Background:   Past Medical History:  Diagnosis Date   Allergy    seasonal allergies   Anxiety    on meds   Asthma    uses inhaler   Breast cancer (HCC) 2012   RIGHT lumpectomy   Cancer (HCC) 2022   RIGHT breast lump-dx 2022   Depression    on meds   DVT (deep venous thrombosis) (HCC) 2010   after hysterectomy   Family history of uterine cancer    GERD (gastroesophageal reflux disease)    with certain foods/OTC PRN meds   Headache(784.0)    History of radiation therapy    Bilateral breast- 09/03/21-11/02/21- Dr. Retta Caster   Hyperlipidemia    on meds   Hypertension    on meds   SVT (supraventricular tachycardia) (HCC)     Assessment: Patient Reported Symptoms:  Cognitive    Occasional memory issues. She said her son calls to remind her of her medical appointments     Neurological  Stress issues related to finances; anxiety issues  HEENT  Unable to assess      Cardiovascular  HTN; Palpitations Weight: 204 lb (92.5 kg)  Respiratory    Asthma  Endocrine  Unable to assess    Gastrointestinal    Nausea , vomiting history    Genitourinary    constipation  Integumentary    Unable to assess  Musculoskeletal      Pain issues; takes 2 pain medications. Sleep difficulty;     Psychosocial    Financial stressors; housing needs; medical stressors, some memory issues   Quality of Family Relationships: supportive      10/10/2023    2:36 PM  Depression screen PHQ 2/9  Decreased Interest 1  Down, Depressed, Hopeless 1  PHQ - 2 Score 2  Altered sleeping 1  Tired, decreased energy 1  Change in appetite 1  Feeling bad or failure about yourself  1  Trouble  concentrating 1  Moving slowly or fidgety/restless 1  Suicidal thoughts 0  PHQ-9 Score 8    Vitals:  Within normal range per client. She checks BP regularly (per client)  Medications Reviewed Today     Reviewed by Suann Elms, LCSW (Social Worker) on 10/10/23 at 1431  Med List Status: <None>   Medication Order Taking? Sig Documenting Provider Last Dose Status Informant  abemaciclib  (VERZENIO ) 50 MG tablet [id number] No Take 1 tablet (50 mg total) by mouth 2 (two) times daily. [name], [name], MD Taking Active   albuterol  (VENTOLIN  HFA) 108 [phone] Base) MCG/ACT inhaler [id number] No Inhale 2 puffs by mouth into the lungs every 6 hours as needed for wheezing or shortness of breath. Marius Siemens, NP Taking Active   amLODipine  (NORVASC ) 5 MG tablet [id number] No Take 1 tablet (5 mg total) by mouth daily. Marius Siemens, NP Taking Active   anastrozole  (ARIMIDEX ) 1 MG tablet [id number] No Take 1 tablet (1 mg total) by mouth daily. Percival Brace, NP Taking Active   calcium -vitamin D (OSCAL WITH D) 500-5 MG-MCG tablet [id number] No Take 1 tablet by mouth daily with supper. [name], [name], MD Taking Active Self  gabapentin  (NEURONTIN ) 300 MG capsule [id number] No  Take 1 capsule (300 mg total) by mouth 3 (three) times daily. [name], [name], MD Taking Active   montelukast  (SINGULAIR ) 10 MG tablet [id number] No Take 1 tablet (10 mg total) by mouth at bedtime. Marius Siemens, NP Taking Active   oxyCODONE  (OXYCONTIN ) 10 mg 12 hr tablet [id number] No Take 1 tablet (10 mg total) by mouth every 12 (twelve) hours.  Patient not taking: Reported on 08/30/2023   [name], Giles Labrum, NP Not Taking Active   oxyCODONE -acetaminophen  (PERCOCET) 7.5-325 MG tablet [id number]  Take 1-2 tablets by mouth every 6 (six) hours as needed for severe pain (pain score 7-10). [name], Giles Labrum, NP  Active   pantoprazole  (PROTONIX ) 40 MG tablet [id number] No Take 1 tablet (40 mg total) by mouth  daily. [name], Hurman Maiden, MD Taking Active   polyethylene glycol (MIRALAX  / GLYCOLAX ) 17 g packet [id number] No Take 17 g by mouth 2 (two) times daily. [name], Hurman Maiden, MD Taking Active   prochlorperazine  (COMPAZINE ) 10 MG tablet [id number] No Take 1 tablet (10 mg total) by mouth every 6 (six) hours as needed for nausea or vomiting. [name], Hurman Maiden, MD Taking Active   propranolol  (INDERAL ) 10 MG tablet [id number] No Take 1 tablet (10 mg total) by mouth 2 (two) times daily. Please call [phone] to schedule an appointment for future refills. Thank you. 3rd attempt. [name], [name], DO Taking Active   senna (SENOKOT) 8.6 MG TABS tablet [id number] No Take 2 tablets (17.2 mg total) by mouth at bedtime. Loma Rising, MD Taking Active   Med List Note Althea Atkinson, RPH-CPP 07/07/22 5643): Abemaciclib  through [facility];   UDS 02/11/22  MR 05/22/22 Medication Agreement signed. LP  Hydrocodone  approved through 5 13 23             Recommendation:   Client to attend scheduled medical appointments Client to take medications as prescribed Client to contact [employer] for food support information . Client to contact [employer] for food support information Client to communicate her needs with family members to seek some support from family members Client to use her car for short distance travel needs Client to attend appointments as scheduled with Pain Clinic Client to communicate with LCSW as needed for SW support at [phone]  Follow Up Plan:   Client has LCSW name and phone number and was encouraged to call LCSW as needed for SW support at [phone]   Alexandria Angel  MSW, LCSW [place] Licensed Clinical Social Worker Direct Dial:  [phone] Fax:  [phone] Website:  [email or web address]

## 2023-10-18 ENCOUNTER — Other Ambulatory Visit: Payer: Self-pay | Admitting: Cardiology

## 2023-10-18 ENCOUNTER — Other Ambulatory Visit: Payer: Self-pay | Admitting: Nurse Practitioner

## 2023-10-18 ENCOUNTER — Other Ambulatory Visit (HOSPITAL_COMMUNITY): Payer: Self-pay

## 2023-10-18 DIAGNOSIS — C7951 Secondary malignant neoplasm of bone: Secondary | ICD-10-CM

## 2023-10-18 DIAGNOSIS — G893 Neoplasm related pain (acute) (chronic): Secondary | ICD-10-CM

## 2023-10-18 DIAGNOSIS — Z170 Estrogen receptor positive status [ER+]: Secondary | ICD-10-CM

## 2023-10-18 DIAGNOSIS — Z515 Encounter for palliative care: Secondary | ICD-10-CM

## 2023-10-18 MED ORDER — OXYCODONE HCL ER 10 MG PO T12A
10.0000 mg | EXTENDED_RELEASE_TABLET | Freq: Two times a day (BID) | ORAL | 0 refills | Status: DC
  Filled 2023-10-25: qty 60, 30d supply, fill #0
  Filled ????-??-??: fill #0

## 2023-10-20 ENCOUNTER — Other Ambulatory Visit: Payer: Self-pay

## 2023-10-24 ENCOUNTER — Other Ambulatory Visit: Payer: Self-pay | Admitting: Nurse Practitioner

## 2023-10-24 ENCOUNTER — Other Ambulatory Visit (HOSPITAL_COMMUNITY): Payer: Self-pay

## 2023-10-24 DIAGNOSIS — G893 Neoplasm related pain (acute) (chronic): Secondary | ICD-10-CM

## 2023-10-24 DIAGNOSIS — Z170 Estrogen receptor positive status [ER+]: Secondary | ICD-10-CM

## 2023-10-24 DIAGNOSIS — C7951 Secondary malignant neoplasm of bone: Secondary | ICD-10-CM

## 2023-10-24 DIAGNOSIS — Z515 Encounter for palliative care: Secondary | ICD-10-CM

## 2023-10-24 MED ORDER — OXYCODONE-ACETAMINOPHEN 7.5-325 MG PO TABS
1.0000 | ORAL_TABLET | Freq: Four times a day (QID) | ORAL | 0 refills | Status: DC | PRN
  Filled 2023-10-24: qty 102, 13d supply, fill #0

## 2023-10-25 ENCOUNTER — Other Ambulatory Visit (HOSPITAL_COMMUNITY): Payer: Self-pay

## 2023-10-27 ENCOUNTER — Encounter: Payer: Self-pay | Admitting: Nurse Practitioner

## 2023-10-27 ENCOUNTER — Inpatient Hospital Stay: Payer: MEDICAID | Attending: Adult Health | Admitting: Nurse Practitioner

## 2023-10-27 DIAGNOSIS — C50411 Malignant neoplasm of upper-outer quadrant of right female breast: Secondary | ICD-10-CM

## 2023-10-27 DIAGNOSIS — Z515 Encounter for palliative care: Secondary | ICD-10-CM

## 2023-10-27 DIAGNOSIS — C7951 Secondary malignant neoplasm of bone: Secondary | ICD-10-CM | POA: Diagnosis not present

## 2023-10-27 DIAGNOSIS — Z170 Estrogen receptor positive status [ER+]: Secondary | ICD-10-CM

## 2023-10-27 DIAGNOSIS — G893 Neoplasm related pain (acute) (chronic): Secondary | ICD-10-CM

## 2023-10-27 NOTE — Progress Notes (Signed)
 Palliative Medicine [facility]  [phone] [phone]   Name: Chelsea Hansen Date: 10/27/2023 MRN: [id number]  DOB: 23-Jul-1970  Patient Care Team: Chelsea Siemens, NP as PCP - General (Internal Medicine) Chelsea Morin, DO as PCP - Cardiology (Cardiology) Chelsea Caster, MD as Consulting Physician (Radiation Oncology) Chelsea Blumenthal, MD as Consulting Physician (General Surgery) Chelsea Cea, MD as Consulting Physician (Hematology and Oncology) [name], Giles Labrum, NP as Nurse Practitioner (Nurse Practitioner)   I connected with Chelsea Hansen on 10/27/23 at  3:00 PM EDT by phone and verified that I am speaking with the correct person using two identifiers.   I discussed the limitations, risks, security and privacy concerns of performing an evaluation and management service by telemedicine and the availability of in-person appointments. I also discussed with the patient that there may be a patient responsible charge related to this service. The patient expressed understanding and agreed to proceed.   Other persons participating in the visit and their role in the encounter: n/a   Patient's location: home  Provider's location: [facility]   Chief Complaint: follow up of symptom management    INTERVAL HISTORY: Chelsea Hansen is a 53 y.o. 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  MRI of L-spine (12/23) showed L3 pathologic fracture and significant lesion along S1 with sclerotic changes.  Palliative ask to see for symptom management and goals of care.   SOCIAL HISTORY:     reports that she has never smoked. She has never used smokeless  tobacco. She reports that she does not currently use alcohol after a past usage of about 7.0 standard drinks of alcohol per week. She reports that she does not use drugs.  ADVANCE DIRECTIVES:   CODE STATUS:   PAST MEDICAL HISTORY: Past Medical History:  Diagnosis Date   Allergy    seasonal allergies   Anxiety    on meds   Asthma    uses inhaler   Breast cancer (HCC) 2012   RIGHT lumpectomy   Cancer (HCC) 2022   RIGHT breast lump-dx 2022   Depression    on meds   DVT (deep venous thrombosis) (HCC) 2010   after hysterectomy   Family history of uterine cancer    GERD (gastroesophageal reflux disease)    with certain foods/OTC PRN meds   Headache(784.0)    History of radiation therapy    Bilateral breast- 09/03/21-11/02/21- Dr. Retta Hansen   Hyperlipidemia    on meds   Hypertension    on meds   SVT (supraventricular tachycardia) (HCC)     ALLERGIES:  has no known allergies.  MEDICATIONS:  Current Outpatient Medications  Medication Sig Dispense Refill   abemaciclib  (VERZENIO ) 50 MG tablet Take 1 tablet (50 mg total) by mouth 2 (two) times daily. 56 tablet 3   albuterol  (VENTOLIN  HFA) 108 (90 Base) MCG/ACT inhaler Inhale 2 puffs by mouth into the lungs every 6 hours as needed for wheezing or shortness of breath. 18 g 2   amLODipine  (NORVASC ) 5 MG tablet Take 1 tablet (5 mg total) by mouth daily. 90 tablet 0   anastrozole  (ARIMIDEX ) 1 MG tablet Take 1 tablet (1 mg total) by mouth daily. 90 tablet 3   calcium -vitamin D (OSCAL  WITH D) 500-5 MG-MCG tablet Take 1 tablet by mouth daily with supper. 90 tablet 2   gabapentin  (NEURONTIN ) 300 MG capsule Take 1 capsule (300 mg total) by mouth 3 (three) times daily. 90 capsule 6   montelukast  (SINGULAIR ) 10 MG tablet Take 1 tablet (10 mg total) by mouth at bedtime. 90 tablet 3   oxyCODONE  (OXYCONTIN ) 10 mg 12 hr tablet Take 1 tablet (10 mg total) by mouth every 12 (twelve) hours. 60 tablet 0   oxyCODONE -acetaminophen  (PERCOCET)  7.5-325 MG tablet Take 1-2 tablets by mouth every 6 (six) hours as needed for severe pain (pain score 7-10). 102 tablet 0   pantoprazole  (PROTONIX ) 40 MG tablet Take 1 tablet (40 mg total) by mouth daily. 30 tablet 3   polyethylene glycol (MIRALAX  / GLYCOLAX ) 17 g packet Take 17 g by mouth 2 (two) times daily. 60 each 3   prochlorperazine  (COMPAZINE ) 10 MG tablet Take 1 tablet (10 mg total) by mouth every 6 (six) hours as needed for nausea or vomiting. 30 tablet 0   propranolol  (INDERAL ) 10 MG tablet Take 1 tablet (10 mg total) by mouth 2 (two) times daily. Please call [phone] to schedule an appointment for future refills. Thank you. 3rd attempt. 30 tablet 0   senna (SENOKOT) 8.6 MG TABS tablet Take 2 tablets (17.2 mg total) by mouth at bedtime. 60 tablet 3   No current facility-administered medications for this visit.    VITAL SIGNS: There were no vitals taken for this visit. There were no vitals filed for this visit.   Estimated body mass index is 28.45 kg/m as calculated from the following:   Height as of 10/10/23: 5\' 11"  (1.803 m).   Weight as of 10/10/23: 204 lb (92.5 kg).   PERFORMANCE STATUS (ECOG) : 1 - Symptomatic but completely ambulatory   IMPRESSION: I connected by phone with Chelsea Hansen for follow-up. No acute distress. States she is doing well overall.  Denies concerns with nausea, vomiting, constipation, or diarrhea. Is remaining active. Working. Occasional fatigue.  Constipation is controlled with senna S2 tablets at bedtime and daily MiraLAX .  Chelsea Hansen reports her pain is well controlled on current regimen. She was started on Oxycontin  10mg  every 12 hours after her insurance declined to continue payment for Xtampza . Tolerating without difficulty. Will continue with use of this at 1-2 tablets every 6 hours as needed for breakthrough pain.  Refills remain appropriate.  PDMP reviewed.  Pain contract on file.  We will continue to closely monitor.  All questions answered and  support provided.  PLAN: OxyContin  10mg  every 12 hours.  Continue Percocet every 6 hours as needed for breakthrough pain. Have been taking more often due to absence of long-acting.  MiraLAX  twice daily for bowel regimen Senna daily Protonix  40 mg daily  Patient reports pain is well-controlled. I will plan to see patient back in 6-8 weeks in collaboration with her other oncology appointments. Patient knows to contact our office sooner if needed.  Patient expressed understanding and was in agreement with this plan. She also understands that She can call the clinic at any time with any questions, concerns, or complaints.     Any controlled substances utilized were prescribed in the context of palliative care. PDMP has been reviewed.    Visit consisted of counseling and education dealing with the complex and emotionally intense issues of symptom management and palliative care in the setting of serious and potentially life-threatening illness.  Dellia Ferguson, AGPCNP-BC  Palliative Medicine Team/[facility]

## 2023-11-01 ENCOUNTER — Other Ambulatory Visit (HOSPITAL_COMMUNITY): Payer: Self-pay

## 2023-11-01 ENCOUNTER — Other Ambulatory Visit: Payer: Self-pay | Admitting: Nurse Practitioner

## 2023-11-01 DIAGNOSIS — G893 Neoplasm related pain (acute) (chronic): Secondary | ICD-10-CM

## 2023-11-01 DIAGNOSIS — C50411 Malignant neoplasm of upper-outer quadrant of right female breast: Secondary | ICD-10-CM

## 2023-11-01 DIAGNOSIS — Z515 Encounter for palliative care: Secondary | ICD-10-CM

## 2023-11-01 DIAGNOSIS — C7951 Secondary malignant neoplasm of bone: Secondary | ICD-10-CM

## 2023-11-01 MED ORDER — OXYCODONE-ACETAMINOPHEN 7.5-325 MG PO TABS
1.0000 | ORAL_TABLET | Freq: Four times a day (QID) | ORAL | 0 refills | Status: DC | PRN
  Filled 2023-11-05: qty 102, 13d supply, fill #0

## 2023-11-01 NOTE — Progress Notes (Signed)
 Specialty Pharmacy Ongoing Clinical Assessment Note  Chelsea Hansen is a 52 y.o. female who is being followed by the specialty pharmacy service for RxSp Oncology   Patient's specialty medication(s) reviewed today: Abemaciclib  (VERZENIO )   Missed doses in the last 4 weeks: 0   Patient/Caregiver did not have any additional questions or concerns.   Therapeutic benefit summary: Patient is achieving benefit   Adverse events/side effects summary: No adverse events/side effects   Patient's therapy is appropriate to: Continue    Goals Addressed             This Visit's Progress    Stabilization of disease   On track    Patient is on track. Patient will maintain adherence.  Per Dr. Alix Aquas office visit notes from 08/30/23 patient remains in remission.         Follow up: 6 months  Malachi Screws Specialty Pharmacist

## 2023-11-01 NOTE — Progress Notes (Signed)
 Specialty Pharmacy Refill Coordination Note  Chelsea Hansen is a 53 y.o. female contacted today regarding refills of specialty medication(s) Abemaciclib  (VERZENIO )   Patient requested [name] at [facility] at [place] date: 11/07/23   Medication will be filled on 11/04/23.

## 2023-11-04 ENCOUNTER — Other Ambulatory Visit: Payer: Self-pay

## 2023-11-05 ENCOUNTER — Other Ambulatory Visit (HOSPITAL_COMMUNITY): Payer: Self-pay

## 2023-11-09 ENCOUNTER — Encounter: Payer: Self-pay | Admitting: Hematology and Oncology

## 2023-11-18 ENCOUNTER — Other Ambulatory Visit: Payer: Self-pay

## 2023-11-18 ENCOUNTER — Other Ambulatory Visit (HOSPITAL_COMMUNITY): Payer: Self-pay

## 2023-11-18 ENCOUNTER — Other Ambulatory Visit: Payer: Self-pay | Admitting: Nurse Practitioner

## 2023-11-18 ENCOUNTER — Other Ambulatory Visit (INDEPENDENT_AMBULATORY_CARE_PROVIDER_SITE_OTHER): Payer: Self-pay | Admitting: Primary Care

## 2023-11-18 ENCOUNTER — Other Ambulatory Visit: Payer: Self-pay | Admitting: Cardiology

## 2023-11-18 DIAGNOSIS — C7951 Secondary malignant neoplasm of bone: Secondary | ICD-10-CM

## 2023-11-18 DIAGNOSIS — I10 Essential (primary) hypertension: Secondary | ICD-10-CM

## 2023-11-18 DIAGNOSIS — Z170 Estrogen receptor positive status [ER+]: Secondary | ICD-10-CM

## 2023-11-18 DIAGNOSIS — Z515 Encounter for palliative care: Secondary | ICD-10-CM

## 2023-11-18 DIAGNOSIS — G893 Neoplasm related pain (acute) (chronic): Secondary | ICD-10-CM

## 2023-11-18 MED ORDER — OXYCODONE-ACETAMINOPHEN 7.5-325 MG PO TABS
1.0000 | ORAL_TABLET | Freq: Four times a day (QID) | ORAL | 0 refills | Status: DC | PRN
  Filled 2023-11-18: qty 102, 13d supply, fill #0

## 2023-11-18 MED ORDER — AMLODIPINE BESYLATE 5 MG PO TABS
5.0000 mg | ORAL_TABLET | Freq: Every day | ORAL | 0 refills | Status: DC
  Filled 2023-11-18: qty 90, 90d supply, fill #0

## 2023-11-18 NOTE — Telephone Encounter (Signed)
 Pt has not been seen since 2023 and pt has had numerous attempts asking pt to make overdue appt with Dr. Emmette Harms and pt still has not done so. Would Dr. Emmette Harms like to refill pt's medication without making an appt? Please address

## 2023-11-18 NOTE — Telephone Encounter (Signed)
 Will forward to provider

## 2023-11-18 NOTE — Telephone Encounter (Signed)
 Routing to provider as not a [facility] pt.

## 2023-11-19 ENCOUNTER — Other Ambulatory Visit (INDEPENDENT_AMBULATORY_CARE_PROVIDER_SITE_OTHER): Payer: Self-pay | Admitting: Primary Care

## 2023-11-19 ENCOUNTER — Other Ambulatory Visit (HOSPITAL_COMMUNITY): Payer: Self-pay

## 2023-11-21 ENCOUNTER — Other Ambulatory Visit (HOSPITAL_COMMUNITY): Payer: Self-pay

## 2023-11-21 ENCOUNTER — Other Ambulatory Visit: Payer: Self-pay

## 2023-11-21 MED ORDER — ONDANSETRON HCL 4 MG PO TABS
4.0000 mg | ORAL_TABLET | Freq: Three times a day (TID) | ORAL | 11 refills | Status: DC
  Filled 2023-11-21: qty 90, 30d supply, fill #0

## 2023-11-23 ENCOUNTER — Other Ambulatory Visit (HOSPITAL_COMMUNITY): Payer: Self-pay

## 2023-11-23 ENCOUNTER — Ambulatory Visit: Payer: Self-pay

## 2023-11-23 ENCOUNTER — Emergency Department (HOSPITAL_COMMUNITY): Payer: MEDICAID

## 2023-11-23 ENCOUNTER — Other Ambulatory Visit: Payer: Self-pay

## 2023-11-23 ENCOUNTER — Encounter: Payer: Self-pay | Admitting: Hematology and Oncology

## 2023-11-23 ENCOUNTER — Emergency Department (HOSPITAL_COMMUNITY)
Admission: EM | Admit: 2023-11-23 | Discharge: 2023-11-23 | Disposition: A | Discharge status: Home / Self Care | Payer: MEDICAID | Source: Home / Self Care | Attending: Emergency Medicine | Admitting: Emergency Medicine

## 2023-11-23 ENCOUNTER — Encounter (HOSPITAL_COMMUNITY): Payer: Self-pay | Admitting: Radiology

## 2023-11-23 DIAGNOSIS — R109 Unspecified abdominal pain: Secondary | ICD-10-CM

## 2023-11-23 DIAGNOSIS — E876 Hypokalemia: Secondary | ICD-10-CM | POA: Insufficient documentation

## 2023-11-23 DIAGNOSIS — R748 Abnormal levels of other serum enzymes: Secondary | ICD-10-CM | POA: Insufficient documentation

## 2023-11-23 DIAGNOSIS — R079 Chest pain, unspecified: Secondary | ICD-10-CM | POA: Insufficient documentation

## 2023-11-23 DIAGNOSIS — R001 Bradycardia, unspecified: Secondary | ICD-10-CM | POA: Insufficient documentation

## 2023-11-23 DIAGNOSIS — R0602 Shortness of breath: Secondary | ICD-10-CM | POA: Insufficient documentation

## 2023-11-23 DIAGNOSIS — Z79899 Other long term (current) drug therapy: Secondary | ICD-10-CM | POA: Insufficient documentation

## 2023-11-23 DIAGNOSIS — R918 Other nonspecific abnormal finding of lung field: Secondary | ICD-10-CM

## 2023-11-23 DIAGNOSIS — Z853 Personal history of malignant neoplasm of breast: Secondary | ICD-10-CM | POA: Insufficient documentation

## 2023-11-23 DIAGNOSIS — R911 Solitary pulmonary nodule: Secondary | ICD-10-CM | POA: Insufficient documentation

## 2023-11-23 DIAGNOSIS — R1084 Generalized abdominal pain: Secondary | ICD-10-CM | POA: Insufficient documentation

## 2023-11-23 DIAGNOSIS — R112 Nausea with vomiting, unspecified: Secondary | ICD-10-CM | POA: Insufficient documentation

## 2023-11-23 LAB — URINALYSIS, ROUTINE W REFLEX MICROSCOPIC
Bacteria, UA: NONE SEEN
Bilirubin Urine: NEGATIVE
Glucose, UA: NEGATIVE mg/dL
Hgb urine dipstick: NEGATIVE
Ketones, ur: NEGATIVE mg/dL
Leukocytes,Ua: NEGATIVE
Nitrite: NEGATIVE
Protein, ur: NEGATIVE mg/dL
Specific Gravity, Urine: <1.005 — ABNORMAL LOW (ref 1.005–1.030)
pH: 7 (ref 5.0–8.0)

## 2023-11-23 LAB — CBC
HCT: 34.4 % — ABNORMAL LOW (ref 36.0–46.0)
Hemoglobin: 10.7 g/dL — ABNORMAL LOW (ref 12.0–15.0)
MCH: 24.1 pg — ABNORMAL LOW (ref 26.0–34.0)
MCHC: 31.1 g/dL (ref 30.0–36.0)
MCV: 77.5 fL — ABNORMAL LOW (ref 80.0–100.0)
Platelets: 269 10*3/uL (ref 150–400)
RBC: 4.44 MIL/uL (ref 3.87–5.11)
RDW: 15.1 % (ref 11.5–15.5)
WBC: 4.5 10*3/uL (ref 4.0–10.5)
nRBC: 0 % (ref 0.0–0.2)

## 2023-11-23 LAB — LIPASE, BLOOD: Lipase: 39 U/L (ref 11–51)

## 2023-11-23 LAB — COMPREHENSIVE METABOLIC PANEL WITH GFR
ALT: 31 U/L (ref 0–44)
AST: 32 U/L (ref 15–41)
Albumin: 4.2 g/dL (ref 3.5–5.0)
Alkaline Phosphatase: 144 U/L — ABNORMAL HIGH (ref 38–126)
Anion gap: 11 (ref 5–15)
BUN: 18 mg/dL (ref 6–20)
CO2: 28 mmol/L (ref 22–32)
Calcium: 9.1 mg/dL (ref 8.9–10.3)
Chloride: 99 mmol/L (ref 98–111)
Creatinine, Ser: 0.84 mg/dL (ref 0.44–1.00)
GFR, Estimated: >60 mL/min (ref >60)
Glucose, Bld: 98 mg/dL (ref 70–99)
Potassium: 3.3 mmol/L — ABNORMAL LOW (ref 3.5–5.1)
Sodium: 138 mmol/L (ref 135–145)
Total Bilirubin: 0.8 mg/dL (ref 0.0–1.2)
Total Protein: 8.3 g/dL — ABNORMAL HIGH (ref 6.5–8.1)

## 2023-11-23 LAB — TROPONIN I (HIGH SENSITIVITY)
Troponin I (High Sensitivity): 6 ng/L (ref <18)
Troponin I (High Sensitivity): 6 ng/L (ref <18)

## 2023-11-23 MED ORDER — SODIUM CHLORIDE 0.9 % IV BOLUS
1000.0000 mL | Freq: Once | INTRAVENOUS | Status: AC
  Administered 2023-11-23: 1000 mL via INTRAVENOUS

## 2023-11-23 MED ORDER — ONDANSETRON HCL 4 MG/2ML IJ SOLN
4.0000 mg | Freq: Once | INTRAMUSCULAR | Status: AC
  Administered 2023-11-23: 4 mg via INTRAVENOUS
  Filled 2023-11-23: qty 2

## 2023-11-23 MED ORDER — POTASSIUM CHLORIDE 10 MEQ/100ML IV SOLN
10.0000 meq | Freq: Once | INTRAVENOUS | Status: AC
  Administered 2023-11-23: 10 meq via INTRAVENOUS
  Filled 2023-11-23: qty 100

## 2023-11-23 MED ORDER — ONDANSETRON 4 MG PO TBDP
4.0000 mg | ORAL_TABLET | Freq: Three times a day (TID) | ORAL | 0 refills | Status: DC | PRN
  Filled 2023-11-23: qty 20, 7d supply, fill #0

## 2023-11-23 MED ORDER — FENTANYL CITRATE PF 50 MCG/ML IJ SOSY
25.0000 ug | PREFILLED_SYRINGE | Freq: Once | INTRAMUSCULAR | Status: AC
  Administered 2023-11-23: 25 ug via INTRAVENOUS
  Filled 2023-11-23: qty 1

## 2023-11-23 MED ORDER — IOHEXOL 350 MG/ML SOLN
100.0000 mL | Freq: Once | INTRAVENOUS | Status: AC | PRN
Start: 2023-11-23 — End: 2023-11-23
  Administered 2023-11-23: 100 mL via INTRAVENOUS

## 2023-11-23 MED ORDER — SODIUM CHLORIDE (PF) 0.9 % IJ SOLN
INTRAMUSCULAR | Status: AC
  Filled 2023-11-23: qty 50

## 2023-11-23 NOTE — ED Provider Notes (Signed)
 [place] EMERGENCY DEPARTMENT AT [facility] Provider Note   CSN: [id number] Arrival date & time: 11/23/23  1221     History  Chief Complaint  Patient presents with   Emesis   Chest Pain    Chelsea Hansen is a 53 y.o. female.  53 year old female presenting with abdominal pain/nausea/vomiting/chest pain/shortness of breath.  Symptoms have been ongoing for 3 weeks, patient reports she has been vomiting "every day, I cannot keep anything down".  She has noted blood in her vomit "almost every time", she says at times it is bright and other times it is dark.  She also complains of anterior chest pain with radiation to her right breast with associated shortness of breath that is worse at night while lying down, all of her symptoms began around 3 weeks ago.  She denies diarrhea, however she has not had a bowel movement in almost 1 week, this is unusual for her.  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 chemo.  Denies recent medication changes.  She is scheduled for an MRI/PET scan tomorrow.  Denies dysuria/hematuria, lower extremity edema.   Emesis Chest Pain Associated symptoms: vomiting        Home Medications Prior to Admission medications   Medication Sig Start Date End Date Taking? Authorizing Provider  abemaciclib  (VERZENIO ) 50 MG tablet Take 1 tablet (50 mg total) by mouth 2 (two) times daily. 06/08/23   [name], [name], MD  albuterol  (VENTOLIN  HFA) 108 (90 Base) MCG/ACT inhaler Inhale 2 puffs by mouth into the lungs every 6 hours as needed for wheezing or shortness of breath. 03/18/23   Marius Siemens, NP  amLODipine  (NORVASC ) 5 MG tablet Take 1 tablet (5 mg total) by mouth daily. 11/18/23   Marius Siemens, NP  anastrozole  (ARIMIDEX ) 1 MG tablet Take 1 tablet (1 mg total) by mouth daily. 04/18/23   Causey, Laura Polio, NP  calcium -vitamin D (OSCAL WITH D) 500-5 MG-MCG tablet Take 1 tablet by mouth daily with  supper. 06/24/22   [name], [name], MD  gabapentin  (NEURONTIN ) 300 MG capsule Take 1 capsule (300 mg total) by mouth 3 (three) times daily. 05/26/23   [name], [name], MD  montelukast  (SINGULAIR ) 10 MG tablet Take 1 tablet (10 mg total) by mouth at bedtime. 05/01/23   Marius Siemens, NP  ondansetron  (ZOFRAN ) 4 MG tablet Take 1 tablet (4 mg total) by mouth every 8 (eight) hours. 11/21/23 11/20/24  Marius Siemens, NP  oxyCODONE  (OXYCONTIN ) 10 mg 12 hr tablet Take 1 tablet (10 mg total) by mouth every 12 (twelve) hours. 10/25/23   [name], Athena N, NP  oxyCODONE -acetaminophen  (PERCOCET) 7.5-325 MG tablet Take 1-2 tablets by mouth every 6 (six) hours as needed for severe pain (pain score 7-10). 11/18/23   [name], Athena N, NP  pantoprazole  (PROTONIX ) 40 MG tablet Take 1 tablet (40 mg total) by mouth daily. 06/30/22   [name], Hurman Maiden, MD  polyethylene glycol (MIRALAX  / GLYCOLAX ) 17 g packet Take 17 g by mouth 2 (two) times daily. 06/30/22   [name], Hurman Maiden, MD  prochlorperazine  (COMPAZINE ) 10 MG tablet Take 1 tablet (10 mg total) by mouth every 6 (six) hours as needed for nausea or vomiting. 06/30/22   [name], Hurman Maiden, MD  propranolol  (INDERAL ) 10 MG tablet Take 1 tablet (10 mg total) by mouth 2 (two) times daily. Please call [phone] to schedule an appointment for future refills. Thank you. 3rd attempt. 06/01/23  [name], [name], DO  senna (SENOKOT) 8.6 MG TABS tablet Take 2 tablets (17.2 mg total) by mouth at bedtime. 06/30/22   Loma Rising, MD      Allergies    Patient has no known allergies.    Review of Systems   Review of Systems  Cardiovascular:  Positive for chest pain.  Gastrointestinal:  Positive for vomiting.    Physical Exam Updated Vital Signs BP 115/76 (BP Location: Left Arm)   Pulse (!) 58   Temp 98.3 F (36.8 C) (Oral)   Resp 16   SpO2 98%  Physical Exam Vitals and nursing note reviewed.  HENT:     Head: Normocephalic.  Eyes:     Extraocular  Movements: Extraocular movements intact.  Cardiovascular:     Rate and Rhythm: Regular rhythm. Bradycardia present.  Pulmonary:     Effort: Pulmonary effort is normal.     Breath sounds: Normal breath sounds.  Abdominal:     General: Bowel sounds are normal.     Palpations: Abdomen is soft.     Tenderness: There is generalized abdominal tenderness. There is no guarding or rebound.  Musculoskeletal:     Cervical back: Normal range of motion.     Right lower leg: No edema.     Left lower leg: No edema.     Comments: Moves all extremities spontaneously without difficulty  Skin:    General: Skin is warm and dry.  Neurological:     General: No focal deficit present.     Mental Status: She is alert.     ED Results / Procedures / Treatments   Labs (all labs ordered are listed, but only abnormal results are displayed) Labs Reviewed  COMPREHENSIVE METABOLIC PANEL WITH GFR - Abnormal; Notable for the following components:      Result Value   Potassium 3.3 (*)    Total Protein 8.3 (*)    Alkaline Phosphatase 144 (*)    All other components within normal limits  CBC - Abnormal; Notable for the following components:   Hemoglobin 10.7 (*)    HCT 34.4 (*)    MCV 77.5 (*)    MCH 24.1 (*)    All other components within normal limits  LIPASE, BLOOD  URINALYSIS, ROUTINE W REFLEX MICROSCOPIC  TROPONIN I (HIGH SENSITIVITY)  TROPONIN I (HIGH SENSITIVITY)    EKG EKG Interpretation Date/Time:  Wednesday November 23 2023 12:47:58 EDT Ventricular Rate:  51 PR Interval:  118 QRS Duration:  106 QT Interval:  438 QTC Calculation: 404 R Axis:   16  Text Interpretation: Sinus rhythm Borderline short PR interval RSR' in V1 or V2, probably normal variant Confirmed by Annita Kindle [phone]) on 11/23/2023 2:08:54 PM  Radiology CT Angio Chest PE W and/or Wo Contrast Result Date: 11/23/2023 CLINICAL DATA:  Chest pain, shortness of breath EXAM: CT ANGIOGRAPHY CHEST WITH CONTRAST TECHNIQUE: Multidetector  CT imaging of the chest was performed using the standard protocol during bolus administration of intravenous contrast. Multiplanar CT image reconstructions and MIPs were obtained to evaluate the vascular anatomy. RADIATION DOSE REDUCTION: This exam was performed according to the departmental dose-optimization program which includes automated exposure control, adjustment of the mA and/or kV according to patient size and/or use of iterative reconstruction technique. CONTRAST:  OMNIPAQUE  IOHEXOL  350 MG/ML SOLN COMPARISON:  None Available. FINDINGS: Cardiovascular: Satisfactory opacification of the pulmonary arteries to the segmental level. No evidence of pulmonary embolism. Normal heart size. No pericardial effusion. Mediastinum/Nodes: No enlarged mediastinal, hilar,  or axillary lymph nodes. Thyroid  gland, trachea, and esophagus demonstrate no significant findings. Lungs/Pleura: There is an ill-defined nodular pattern throughout both lungs with small small nodular densities that measured between a few mm up to 4 mm bilaterally. These findings in combination with the previously described findings of metastatic bone disease raises the possibility of metastatic lung disease. There is no other infiltrates or consolidations or pleural effusions. Upper Abdomen: No acute abnormality. Musculoskeletal: Diffuse sclerotic metastatic bone disease of the thoracic spine, sternum and ribs. Review of the MIP images confirms the above findings. IMPRESSION: *No evidence of pulmonary embolism. *Diffuse sclerotic metastatic bone disease. *Ill-defined nodular pattern throughout both lungs with small nodular densities that measured between a few mm up to 4 mm bilaterally. These findings in combination with the previously described findings of metastatic bone disease raises the possibility of metastatic lung disease. Electronically Signed   By: Fredrich Jefferson M.D.   On: 11/23/2023 15:20   CT ABDOMEN PELVIS W CONTRAST Result Date:  11/23/2023 CLINICAL DATA:  Abdominal pain EXAM: CT ABDOMEN AND PELVIS WITH CONTRAST TECHNIQUE: Multidetector CT imaging of the abdomen and pelvis was performed using the standard protocol following bolus administration of intravenous contrast. RADIATION DOSE REDUCTION: This exam was performed according to the departmental dose-optimization program which includes automated exposure control, adjustment of the mA and/or kV according to patient size and/or use of iterative reconstruction technique. CONTRAST:  OMNIPAQUE  IOHEXOL  350 MG/ML SOLN COMPARISON:  CT abdomen and pelvis August 23, 2022 FINDINGS: Lower chest: No infiltrates or consolidations, no pleural effusions Hepatobiliary: Liver normal size no masses no biliary dilatation. Gallbladder unremarkable. No gallstones. Pancreas: Pancreas normal size. No masses calcifications or inflammatory changes. Spleen: Spleen normal size.  No masses. Adrenals/Urinary Tract: Adrenal glands are normal size. Follow-up recommended. Kidneys are normal. No masses calcifications or hydronephrosis Stomach/Bowel: No small or large bowel obstruction or inflammatory changes. Diffuse pan diverticulosis unchanged since prior. Moderate amount of residual fecal material throughout the colon without obstruction or constipation. Vascular/Lymphatic: No significant vascular findings are present. No enlarged abdominal or pelvic lymph nodes. Reproductive: .  No masses. Bladder unremarkable. Other: Anterior abdominal wall unremarkable without evidence of umbilical or inguinal hernias Musculoskeletal: Again noted multiple sclerotic lesions involving the thoracolumbar spine with fracture deformity of L3 correlates with metastatic bone disease. IMPRESSION: *No acute findings in the abdomen or pelvis. *Diffuse pan diverticulosis unchanged since prior. *Moderate amount of residual fecal material throughout the colon without obstruction or constipation. *Again noted multiple sclerotic lesions involving  the thoracolumbar spine with fracture deformity of L3 correlates with metastatic bone disease. Electronically Signed   By: Fredrich Jefferson M.D.   On: 11/23/2023 15:17    Procedures Procedures    Medications Ordered in ED Medications  sodium chloride  0.9 % bolus 1,000 mL (1,000 mLs Intravenous New Bag/Given 11/23/23 1406)  potassium chloride  10 mEq in 100 mL IVPB (0 mEq Intravenous Stopped 11/23/23 1531)  ondansetron  (ZOFRAN ) injection 4 mg (4 mg Intravenous Given 11/23/23 1406)  fentaNYL  (SUBLIMAZE ) injection 25 mcg (25 mcg Intravenous Given 11/23/23 1453)  iohexol  (OMNIPAQUE ) 350 MG/ML injection 100 mL (100 mLs Intravenous Contrast Given 11/23/23 1459)    ED Course/ Medical Decision Making/ A&P                                 Medical Decision Making This patient presents to the ED for concern of chest pain/shortness of breath/abdominal pain/nausea/vomiting, this involves  an extensive number of treatment options, and is a complaint that carries with it a high risk of complications and morbidity.  The differential diagnosis includes ACS, stable versus unstable angina, PE, gastroenteritis, SBO, pancreatitis.   Co morbidities that complicate the patient evaluation  Breast cancer with bony metastasis   Additional history obtained:  Additional history obtained from chart review External records from outside source obtained and reviewed including recent oncology notes   Lab Tests:  I Ordered, and personally interpreted labs.  The pertinent results include: CBC notable for microcytic anemia with HGB of 10.7, however this is stable as compared to previous.  CMP notable for mild hypokalemia, elevated alk phos but this is likely consistent with known bony metastasis.  Lipase within normal limits.  Initial troponin 6, repeat troponin remains unchanged, low suspicion for ACS.   Imaging Studies ordered:  I ordered imaging studies including  CTA chest PE study, CT abdomen/pelvis with contrast I  independently visualized and interpreted imaging which showed  - CTA chest: No evidence of pulmonary embolism. Diffuse sclerotic metastatic bone disease. Ill-defined nodular pattern throughout both lungs with small nodular densities that measured between a few mm up to 4 mm bilaterally. These findings in combination with the previously described findings of metastatic bone disease raises the possibilityof metastatic lung disease. - CT abdomen/pelvis: No acute findings in the abdomen or pelvis. Diffuse pan diverticulosis unchanged since prior. Moderate amount of residual fecal material throughout the colon without obstruction or constipation. Again noted multiple sclerotic lesions involving the thoracolumbar spine with fracture deformity of L3 correlates with metastatic bone disease.  I agree with the radiologist interpretation   Cardiac Monitoring: / EKG:  The patient was maintained on a cardiac monitor.  I personally viewed and interpreted the cardiac monitored which showed an underlying rhythm of: sinus bradycardia   Problem List / ED Course / Critical interventions / Medication management  I initiated IV fluid bolus for rehydration I ordered medication including Zofran  for nausea, IV potassium for hypokalemia, fentanyl  for pain Reevaluation of the patient after these medicines showed that the patient improved I have reviewed the patients home medicines and have made adjustments as needed   Social Determinants of Health:  Financial instability, stress, depression   Test / Admission - Considered:  Physical exam notable for generalized abdominal tenderness without guarding/rigidity.  Because patient has experienced daily vomiting for 3 weeks and has not had a bowel movement in a little over a week I am concerned for intra-abdominal pathology including but not limited to SBO, appendicitis, pancreatitis, therefore I did proceed with CT abdomen/pelvis imaging with contrast.  Patient has  known breast malignancy with bony metastasis, she has complained of chest pain and worsening shortness of breath x 3 weeks, no lower extremity edema however based on patient's age and known malignancy with mets I did proceed to order PE study to assess for pulmonary embolism.  See above for lab and imaging results.  Patient's workup was negative for pulmonary embolism, repeat troponins are within normal limits and this is reassuring, it is unlikely that ACS is contributing to her chest pain, this is likely secondary to repeated episodes of vomiting.  CTA of the chest was notable for multiple pulmonary nodules, this is a new finding and may be consistent with metastasis from known malignancy, patient has follow-up with her oncologist next week and is scheduled for PET scan/MRI tomorrow.  Patient has maintained her oxygen saturation between 95 and at 100% on room air every  time I examined her. Patient did report what she believes to be blood in her vomit, this is likely attributed to a Mallory-Weiss tear from repeated episodes of vomiting however this is not a definitive diagnosis without further endoscopic evaluation, however CBC is reassuring, hemoglobin remained stable as compared to previous.  CT abdomen and pelvis is negative for acute findings, however there is a significant volume of stool in the colon without evidence of obstruction, advised patient this is likely secondary to her home opioids as well as dehydration from the repeated vomiting.  Upon reassessment, patient has not experienced any additional episodes of vomiting since presenting to this emergency department, I will prescribe patient with ODT Zofran  as she was unable to tolerate the pill secondary to vomiting.  She reports that her abdominal pain has improved.  She is scheduled to have a PET scan/MRI tomorrow and is scheduled to follow-up with her oncologist next week.  Advised patient to follow-up with her primary care provider/oncologist in  regard to her frequent vomiting, this may be a side effect of her chemotherapy or other medications.  Patient voiced understanding and is agreeable with this plan.  Strict return precautions discussed, patient is appropriate for discharge at this time.  Amount and/or Complexity of Data Reviewed Labs: ordered. Radiology: ordered.  Risk Prescription drug management.           Final Clinical Impression(s) / ED Diagnoses Final diagnoses:  Nausea and vomiting, unspecified vomiting type  Abdominal pain, unspecified abdominal location  Chest pain, unspecified type  Shortness of breath  Pulmonary nodules    Rx / DC Orders ED Discharge Orders          Ordered    ondansetron  (ZOFRAN -ODT) 4 MG disintegrating tablet  Every 8 hours PRN        11/23/23 1651              Kendrick Pax, PA-C 11/23/23 1703    Merdis Stalling, MD 11/28/23 1758

## 2023-11-23 NOTE — Telephone Encounter (Signed)
  FYI Only or Action Required?: FYI only for provider  Patient was last seen in primary care on 06/20/2023 by Marius Siemens, NP. Called Nurse Triage reporting Emesis. Symptoms began several weeks ago. Interventions attempted: OTC medications: OTC acid reducer. Symptoms are: rapidly worsening.  Triage Disposition: Go to ED Now (Notify PCP)  Patient/caregiver understands and will follow disposition?: yes    Pt having chest pain and associated sx- sent to ED          Copied from CRM [id number]. Topic: Clinical - Red Word Triage >> Nov 23, 2023 11:30 AM Lizabeth Riggs wrote: Red Word that prompted transfer to Nurse Triage:  She has been throwing up and it is worse at night; has heartburn; pain level 10++ Reason for Disposition  SEVERE chest pain  Answer Assessment - Initial Assessment Questions 1. LOCATION: "Where does it hurt?"       Chest under right breast  2. RADIATION: "Does the pain go anywhere else?" (e.g., into neck, jaw, arms, back)     Arms, back neck jaw 3. ONSET: "When did the chest pain begin?" (Minutes, hours or days)      2 weeks  4. PATTERN: "Does the pain come and go, or has it been constant since it started?"  "Does it get worse with exertion?"      Comes and goes worse at night -worse with exertion 6. SEVERITY: "How bad is the pain?"  (e.g., Scale 1-10; mild, moderate, or severe)    - MILD (1-3): doesn't interfere with normal activities     - MODERATE (4-7): interferes with normal activities or awakens from sleep    - SEVERE (8-10): excruciating pain, unable to do any normal activities       severe 9. CAUSE: "What do you think is causing the chest pain?"     no 10. OTHER SYMPTOMS: "Do you have any other symptoms?" (e.g., dizziness, nausea, vomiting, sweating, fever, difficulty breathing, cough)       Dizziness, nausea, vomiting x 1 month, sweating, fever, difficulties  Protocols used: Chest Pain-A-AH

## 2023-11-23 NOTE — Discharge Instructions (Addendum)
 The CT scan of your lungs was notable for pulmonary nodules, contact your oncologist/primary care provider for follow-up.  Return to the emergency department if your symptoms worsen.  Continue Zofran  as needed for nausea.  Continue to push fluids to avoid dehydration.

## 2023-11-23 NOTE — ED Triage Notes (Signed)
 Cancer pt with c/o chest pain, emesis, dizziness ongoing for 3 weeks.

## 2023-11-23 NOTE — ED Notes (Signed)
Lab made aware of troponin add on

## 2023-11-24 ENCOUNTER — Encounter (HOSPITAL_COMMUNITY): Payer: Self-pay | Admitting: *Deleted

## 2023-11-24 ENCOUNTER — Other Ambulatory Visit: Payer: Self-pay

## 2023-11-24 ENCOUNTER — Inpatient Hospital Stay (HOSPITAL_COMMUNITY)
Admission: EM | Admit: 2023-11-24 | Discharge: 2023-11-26 | DRG: 392 | Disposition: A | Discharge status: Home / Self Care | Payer: MEDICAID | Attending: Internal Medicine | Admitting: Internal Medicine

## 2023-11-24 ENCOUNTER — Encounter (HOSPITAL_COMMUNITY)
Admission: RE | Admit: 2023-11-24 | Discharge: 2023-11-24 | Disposition: A | Discharge status: Home / Self Care | Payer: MEDICAID | Source: Ambulatory Visit | Attending: Hematology and Oncology | Admitting: Hematology and Oncology

## 2023-11-24 ENCOUNTER — Other Ambulatory Visit (HOSPITAL_COMMUNITY): Payer: Self-pay

## 2023-11-24 DIAGNOSIS — Z6828 Body mass index (BMI) 28.0-28.9, adult: Secondary | ICD-10-CM

## 2023-11-24 DIAGNOSIS — I10 Essential (primary) hypertension: Secondary | ICD-10-CM | POA: Diagnosis present

## 2023-11-24 DIAGNOSIS — C7951 Secondary malignant neoplasm of bone: Secondary | ICD-10-CM | POA: Diagnosis present

## 2023-11-24 DIAGNOSIS — Z9851 Tubal ligation status: Secondary | ICD-10-CM

## 2023-11-24 DIAGNOSIS — Z170 Estrogen receptor positive status [ER+]: Secondary | ICD-10-CM

## 2023-11-24 DIAGNOSIS — Z79899 Other long term (current) drug therapy: Secondary | ICD-10-CM

## 2023-11-24 DIAGNOSIS — C50911 Malignant neoplasm of unspecified site of right female breast: Secondary | ICD-10-CM | POA: Diagnosis present

## 2023-11-24 DIAGNOSIS — R001 Bradycardia, unspecified: Secondary | ICD-10-CM | POA: Diagnosis present

## 2023-11-24 DIAGNOSIS — K920 Hematemesis: Principal | ICD-10-CM

## 2023-11-24 DIAGNOSIS — K219 Gastro-esophageal reflux disease without esophagitis: Secondary | ICD-10-CM | POA: Diagnosis present

## 2023-11-24 DIAGNOSIS — E669 Obesity, unspecified: Secondary | ICD-10-CM | POA: Diagnosis present

## 2023-11-24 DIAGNOSIS — Z79811 Long term (current) use of aromatase inhibitors: Secondary | ICD-10-CM

## 2023-11-24 DIAGNOSIS — I471 Supraventricular tachycardia, unspecified: Secondary | ICD-10-CM | POA: Diagnosis present

## 2023-11-24 DIAGNOSIS — J45909 Unspecified asthma, uncomplicated: Secondary | ICD-10-CM | POA: Diagnosis present

## 2023-11-24 DIAGNOSIS — R918 Other nonspecific abnormal finding of lung field: Secondary | ICD-10-CM | POA: Diagnosis present

## 2023-11-24 DIAGNOSIS — C50412 Malignant neoplasm of upper-outer quadrant of left female breast: Secondary | ICD-10-CM | POA: Diagnosis present

## 2023-11-24 DIAGNOSIS — E785 Hyperlipidemia, unspecified: Secondary | ICD-10-CM | POA: Diagnosis present

## 2023-11-24 DIAGNOSIS — Z5986 Financial insecurity: Secondary | ICD-10-CM

## 2023-11-24 DIAGNOSIS — E876 Hypokalemia: Secondary | ICD-10-CM | POA: Diagnosis not present

## 2023-11-24 DIAGNOSIS — F32A Depression, unspecified: Secondary | ICD-10-CM | POA: Diagnosis present

## 2023-11-24 DIAGNOSIS — Z803 Family history of malignant neoplasm of breast: Secondary | ICD-10-CM

## 2023-11-24 DIAGNOSIS — C50411 Malignant neoplasm of upper-outer quadrant of right female breast: Secondary | ICD-10-CM | POA: Diagnosis present

## 2023-11-24 DIAGNOSIS — K5900 Constipation, unspecified: Secondary | ICD-10-CM | POA: Diagnosis present

## 2023-11-24 DIAGNOSIS — I4719 Other supraventricular tachycardia: Secondary | ICD-10-CM | POA: Diagnosis present

## 2023-11-24 DIAGNOSIS — R112 Nausea with vomiting, unspecified: Secondary | ICD-10-CM | POA: Diagnosis not present

## 2023-11-24 DIAGNOSIS — R110 Nausea: Secondary | ICD-10-CM | POA: Diagnosis present

## 2023-11-24 DIAGNOSIS — D630 Anemia in neoplastic disease: Secondary | ICD-10-CM | POA: Diagnosis present

## 2023-11-24 DIAGNOSIS — Z86718 Personal history of other venous thrombosis and embolism: Secondary | ICD-10-CM

## 2023-11-24 DIAGNOSIS — Z90710 Acquired absence of both cervix and uterus: Secondary | ICD-10-CM

## 2023-11-24 DIAGNOSIS — G894 Chronic pain syndrome: Secondary | ICD-10-CM | POA: Diagnosis present

## 2023-11-24 LAB — COMPREHENSIVE METABOLIC PANEL WITH GFR
ALT: 32 U/L (ref 0–44)
AST: 31 U/L (ref 15–41)
Albumin: 3.9 g/dL (ref 3.5–5.0)
Alkaline Phosphatase: 138 U/L — ABNORMAL HIGH (ref 38–126)
Anion gap: 9 (ref 5–15)
BUN: 15 mg/dL (ref 6–20)
CO2: 30 mmol/L (ref 22–32)
Calcium: 8.8 mg/dL — ABNORMAL LOW (ref 8.9–10.3)
Chloride: 99 mmol/L (ref 98–111)
Creatinine, Ser: 0.85 mg/dL (ref 0.44–1.00)
GFR, Estimated: >60 mL/min (ref >60)
Glucose, Bld: 105 mg/dL — ABNORMAL HIGH (ref 70–99)
Potassium: 3.4 mmol/L — ABNORMAL LOW (ref 3.5–5.1)
Sodium: 138 mmol/L (ref 135–145)
Total Bilirubin: 0.5 mg/dL (ref 0.0–1.2)
Total Protein: 7.7 g/dL (ref 6.5–8.1)

## 2023-11-24 LAB — CBC WITH DIFFERENTIAL/PLATELET
Abs Immature Granulocytes: 0.03 10*3/uL (ref 0.00–0.07)
Basophils Absolute: 0 10*3/uL (ref 0.0–0.1)
Basophils Relative: 1 %
Eosinophils Absolute: 0.1 10*3/uL (ref 0.0–0.5)
Eosinophils Relative: 1 %
HCT: 35.7 % — ABNORMAL LOW (ref 36.0–46.0)
Hemoglobin: 10.8 g/dL — ABNORMAL LOW (ref 12.0–15.0)
Immature Granulocytes: 1 %
Lymphocytes Relative: 22 %
Lymphs Abs: 0.9 10*3/uL (ref 0.7–4.0)
MCH: 23.9 pg — ABNORMAL LOW (ref 26.0–34.0)
MCHC: 30.3 g/dL (ref 30.0–36.0)
MCV: 79.2 fL — ABNORMAL LOW (ref 80.0–100.0)
Monocytes Absolute: 0.6 10*3/uL (ref 0.1–1.0)
Monocytes Relative: 13 %
Neutro Abs: 2.7 10*3/uL (ref 1.7–7.7)
Neutrophils Relative %: 62 %
Platelets: 243 10*3/uL (ref 150–400)
RBC: 4.51 MIL/uL (ref 3.87–5.11)
RDW: 15 % (ref 11.5–15.5)
WBC: 4.3 10*3/uL (ref 4.0–10.5)
nRBC: 0 % (ref 0.0–0.2)

## 2023-11-24 LAB — GLUCOSE, CAPILLARY: Glucose-Capillary: 100 mg/dL — ABNORMAL HIGH (ref 70–99)

## 2023-11-24 LAB — HIV ANTIBODY (ROUTINE TESTING W REFLEX): HIV Screen 4th Generation wRfx: NONREACTIVE

## 2023-11-24 LAB — LIPASE, BLOOD: Lipase: 44 U/L (ref 11–51)

## 2023-11-24 MED ORDER — PROMETHAZINE (PHENERGAN) 6.25MG IN NS 50ML IVPB
6.2500 mg | Freq: Once | INTRAVENOUS | Status: AC
  Administered 2023-11-24: 6.25 mg via INTRAVENOUS
  Filled 2023-11-24: qty 6.25

## 2023-11-24 MED ORDER — MAGNESIUM CITRATE PO SOLN
1.0000 | Freq: Once | ORAL | Status: AC
  Administered 2023-11-24: 1 via ORAL
  Filled 2023-11-24: qty 296

## 2023-11-24 MED ORDER — PANTOPRAZOLE SODIUM 40 MG IV SOLR
40.0000 mg | Freq: Once | INTRAVENOUS | Status: AC
  Administered 2023-11-24: 40 mg via INTRAVENOUS
  Filled 2023-11-24: qty 10

## 2023-11-24 MED ORDER — ONDANSETRON HCL 4 MG/2ML IJ SOLN
4.0000 mg | Freq: Four times a day (QID) | INTRAMUSCULAR | Status: DC | PRN
  Administered 2023-11-24: 4 mg via INTRAVENOUS
  Filled 2023-11-24: qty 2

## 2023-11-24 MED ORDER — POLYETHYLENE GLYCOL 3350 17 G PO PACK
17.0000 g | PACK | Freq: Once | ORAL | Status: AC
  Administered 2023-11-24: 17 g via ORAL
  Filled 2023-11-24: qty 1

## 2023-11-24 MED ORDER — ONDANSETRON HCL 4 MG PO TABS: 4.0000 mg | ORAL_TABLET | Freq: Four times a day (QID) | ORAL | Status: DC | PRN

## 2023-11-24 MED ORDER — ALBUTEROL SULFATE HFA 108 (90 BASE) MCG/ACT IN AERS: 2.0000 | INHALATION_SPRAY | Freq: Four times a day (QID) | RESPIRATORY_TRACT | Status: DC | PRN

## 2023-11-24 MED ORDER — POTASSIUM CHLORIDE 10 MEQ/100ML IV SOLN
10.0000 meq | INTRAVENOUS | Status: AC
  Administered 2023-11-24 (×3): 10 meq via INTRAVENOUS
  Filled 2023-11-24 (×3): qty 100

## 2023-11-24 MED ORDER — BISACODYL 5 MG PO TBEC
10.0000 mg | DELAYED_RELEASE_TABLET | Freq: Once | ORAL | Status: AC
  Administered 2023-11-24: 10 mg via ORAL
  Filled 2023-11-24: qty 2

## 2023-11-24 MED ORDER — SODIUM CHLORIDE 0.9 % IV SOLN: INTRAVENOUS | Status: AC

## 2023-11-24 MED ORDER — ENOXAPARIN SODIUM 40 MG/0.4ML IJ SOSY
40.0000 mg | PREFILLED_SYRINGE | INTRAMUSCULAR | Status: DC
  Administered 2023-11-24 – 2023-11-25 (×2): 40 mg via SUBCUTANEOUS
  Filled 2023-11-24 (×2): qty 0.4

## 2023-11-24 MED ORDER — HYDROMORPHONE HCL 1 MG/ML IJ SOLN: 1.0000 mg | Freq: Four times a day (QID) | INTRAMUSCULAR | Status: DC | PRN

## 2023-11-24 MED ORDER — ONDANSETRON HCL 4 MG/2ML IJ SOLN
4.0000 mg | Freq: Four times a day (QID) | INTRAMUSCULAR | Status: DC | PRN
  Administered 2023-11-24 – 2023-11-25 (×4): 4 mg via INTRAVENOUS
  Filled 2023-11-24 (×4): qty 2

## 2023-11-24 MED ORDER — PANTOPRAZOLE SODIUM 40 MG IV SOLR
40.0000 mg | Freq: Every day | INTRAVENOUS | Status: DC
  Administered 2023-11-25 – 2023-11-26 (×2): 40 mg via INTRAVENOUS
  Filled 2023-11-24 (×2): qty 10

## 2023-11-24 MED ORDER — ACETAMINOPHEN 325 MG PO TABS: 650.0000 mg | ORAL_TABLET | Freq: Four times a day (QID) | ORAL | Status: DC | PRN

## 2023-11-24 MED ORDER — LACTATED RINGERS IV BOLUS
1000.0000 mL | Freq: Once | INTRAVENOUS | Status: AC
  Administered 2023-11-24: 1000 mL via INTRAVENOUS

## 2023-11-24 MED ORDER — DEXAMETHASONE SODIUM PHOSPHATE 10 MG/ML IJ SOLN
10.0000 mg | Freq: Once | INTRAMUSCULAR | Status: AC
  Administered 2023-11-24: 10 mg via INTRAVENOUS
  Filled 2023-11-24: qty 1

## 2023-11-24 MED ORDER — GLYCERIN (LAXATIVE) 2.1 G RE SUPP
1.0000 | Freq: Once | RECTAL | Status: AC
  Administered 2023-11-24: 1 via RECTAL
  Filled 2023-11-24: qty 1

## 2023-11-24 MED ORDER — ALBUTEROL SULFATE (2.5 MG/3ML) 0.083% IN NEBU: 2.5000 mg | INHALATION_SOLUTION | Freq: Four times a day (QID) | RESPIRATORY_TRACT | Status: DC | PRN

## 2023-11-24 MED ORDER — ACETAMINOPHEN 650 MG RE SUPP: 650.0000 mg | Freq: Four times a day (QID) | RECTAL | Status: DC | PRN

## 2023-11-24 MED ORDER — FLUDEOXYGLUCOSE F - 18 (FDG) INJECTION
10.2000 | Freq: Once | INTRAVENOUS | Status: AC
  Administered 2023-11-24: 9.92 via INTRAVENOUS

## 2023-11-24 NOTE — ED Provider Notes (Signed)
 [place] EMERGENCY DEPARTMENT AT [facility] Provider Note  CSN: [id number] Arrival date & time: 11/24/23 9197  Chief Complaint(s) Emesis  HPI Chelsea Hansen is a 53 y.o. female with PMHx metastatic breast cancer on chemotherapy, asthma, GERD, HLD, HTN and SVT presents with intractable nausea vomiting.  Ongoing for the past 3 weeks, worse over the past 10 days. Unable to keep down liquids and solids, particularly at night. Reports severe abdominal pain diffusely, worse in the upper abdomen. Has not had BM or passing gas in 10 days. Similar prior episode a year ago requiring admission for IV fluids thought to be secondary to chemotherapy per patient report.   Seen 6/4 for similar concern. CTA chest without acute pathology but showed possible progression of metastatic breast cancer.  CTAP showed no acute finding, diffuse diverticulosis and moderate constipation. Discharged with Zofran  but states it did not help.  Past Medical History Past Medical History:  Diagnosis Date   Allergy    seasonal allergies   Anxiety    on meds   Asthma    uses inhaler   Breast cancer (HCC) 2012   RIGHT lumpectomy   Cancer (HCC) 2022   RIGHT breast lump-dx 2022   Depression    on meds   DVT (deep venous thrombosis) (HCC) 2010   after hysterectomy   Family history of uterine cancer    GERD (gastroesophageal reflux disease)    with certain foods/OTC PRN meds   Headache(784.0)    History of radiation therapy    Bilateral breast- 09/03/21-11/02/21- Dr. Lynwood Nasuti   Hyperlipidemia    on meds   Hypertension    on meds   SVT (supraventricular tachycardia) [facility])    Patient Active Problem List   Diagnosis Date Noted   Neoplasm related pain 12/07/2022   Chronic constipation 06/30/2022   Nausea and vomiting in adult 06/29/2022   Hematemesis 06/28/2022   Metastasis to bone (HCC) 05/20/2022   PSVT (paroxysmal supraventricular tachycardia) (HCC) 07/08/2021   Depression due to physical  illness 05/27/2021   GERD (gastroesophageal reflux disease) 05/27/2021   Hyperlipidemia 05/27/2021   Hypertension 05/27/2021   Pain management contract signed 05/04/2021   Chronic pain syndrome (breast cancer) 05/04/2021   Palpitations 03/25/2021   Sinus bradycardia 03/25/2021   Daytime somnolence 03/25/2021   Snoring 03/25/2021   Obesity (BMI 30-39.9) 03/25/2021   Port-A-Cath in place 01/27/2021   Genetic testing 12/24/2020   Family history of uterine cancer 12/17/2020   Malignant neoplasm of upper-outer quadrant of right breast in female, estrogen receptor positive (HCC) 12/12/2020   Malignant neoplasm of upper-outer quadrant of left breast in female, estrogen receptor positive (HCC) 12/12/2020   Hypokalemia 01/02/2012   Asthma 01/01/2012   Bradycardia 01/01/2012   Syncope 12/31/2011   Mediastinal adenopathy 12/31/2011   Anemia 12/31/2011   Chest pain 12/31/2011   Home Medication(s) Prior to Admission medications   Medication Sig Start Date End Date Taking? Authorizing Provider  abemaciclib  (VERZENIO ) 50 MG tablet Take 1 tablet (50 mg total) by mouth 2 (two) times daily. 06/08/23   [name], [name], MD  albuterol  (VENTOLIN  HFA) 108 (90 Base) MCG/ACT inhaler Inhale 2 puffs by mouth into the lungs every 6 hours as needed for wheezing or shortness of breath. 03/18/23   [name], NP  amLODipine  (NORVASC ) 5 MG tablet Take 1 tablet (5 mg total) by mouth daily. 11/18/23   [name], NP  anastrozole  (ARIMIDEX ) 1 MG tablet Take 1 tablet (1 mg total)  by mouth daily. 04/18/23   Causey, Morna Pickle, NP  calcium -vitamin D  (OSCAL WITH D) 500-5 MG-MCG tablet Take 1 tablet by mouth daily with supper. 06/24/22   [name], [name], MD  gabapentin  (NEURONTIN ) 300 MG capsule Take 1 capsule (300 mg total) by mouth 3 (three) times daily. 05/26/23   [name], [name], MD  montelukast  (SINGULAIR ) 10 MG tablet Take 1 tablet (10 mg total) by mouth at bedtime. 05/01/23   [name], NP   ondansetron  (ZOFRAN -ODT) 4 MG disintegrating tablet Dissolve 1 tablet (4 mg total) by mouth every 8 (eight) hours as needed for nausea or vomiting. 11/23/23   Glendia Rocky SAILOR, PA-C  oxyCODONE  (OXYCONTIN ) 10 mg 12 hr tablet Take 1 tablet (10 mg total) by mouth every 12 (twelve) hours. 10/25/23   [name], Athena N, NP  oxyCODONE -acetaminophen  (PERCOCET) 7.5-325 MG tablet Take 1-2 tablets by mouth every 6 (six) hours as needed for severe pain (pain score 7-10). 11/18/23   [name], Athena N, NP  pantoprazole  (PROTONIX ) 40 MG tablet Take 1 tablet (40 mg total) by mouth daily. 06/30/22   [name], [name], MD  polyethylene glycol (MIRALAX  / GLYCOLAX ) 17 g packet Take 17 g by mouth 2 (two) times daily. 06/30/22   [name], [name], MD  prochlorperazine  (COMPAZINE ) 10 MG tablet Take 1 tablet (10 mg total) by mouth every 6 (six) hours as needed for nausea or vomiting. 06/30/22   [name], [name], MD  propranolol  (INDERAL ) 10 MG tablet Take 1 tablet (10 mg total) by mouth 2 (two) times daily. Please call [phone] to schedule an appointment for future refills. Thank you. 3rd attempt. 06/01/23   [name], [name], DO  senna (SENOKOT) 8.6 MG TABS tablet Take 2 tablets (17.2 mg total) by mouth at bedtime. 06/30/22   Davia [name], MD                                                                                                                                    Past Surgical History Past Surgical History:  Procedure Laterality Date   ABDOMINAL HYSTERECTOMY     AXILLARY SENTINEL NODE BIOPSY Left 07/09/2021   Procedure: LEFT AXILLARY SENTINEL NODE BIOPSY;  Surgeon: [name], MD;  Location: [facility];  Service: General;  Laterality: Left;   BIOPSY  06/29/2022   Procedure: BIOPSY;  Surgeon: Legrand Victory LITTIE DOUGLAS, MD;  Location: [facility];  Service: Gastroenterology;;   BREAST EXCISIONAL BIOPSY Right 09/16/2009   BREAST LUMPECTOMY WITH RADIOACTIVE SEED LOCALIZATION Bilateral 07/09/2021    Procedure: BILATERAL BREAST LUMPECTOMY WITH RADIOACTIVE SEED LOCALIZATION;  Surgeon: [name], MD;  Location: [facility];  Service: General;  Laterality: Bilateral;   BREAST SURGERY     lumpectomy   ESOPHAGOGASTRODUODENOSCOPY (EGD) WITH PROPOFOL  N/A 06/29/2022   Procedure: ESOPHAGOGASTRODUODENOSCOPY (EGD) WITH PROPOFOL ;  Surgeon: Legrand Victory LITTIE DOUGLAS, MD;  Location: [facility];  Service: Gastroenterology;  Laterality: N/A;  PORT-A-CATH REMOVAL Left 07/09/2021   Procedure: REMOVAL PORT-A-CATH;  Surgeon: [name], MD;  Location: [facility];  Service: General;  Laterality: Left;   PORTACATH PLACEMENT Left 01/26/2021   Procedure: INSERTION PORT-A-CATH;  Surgeon: [name], MD;  Location: [facility];  Service: General;  Laterality: Left;   RADIOACTIVE SEED GUIDED AXILLARY SENTINEL LYMPH NODE Right 07/09/2021   Procedure: RADIOACTIVE SEED GUIDED RIGHT AXILLARY SENTINEL LYMPH NODE DISSECTION;  Surgeon: [name], MD;  Location: [facility];  Service: General;  Laterality: Right;   TUBAL LIGATION     Family History Family History  Problem Relation Age of Onset   Breast cancer Mother        33s   Hypertension Father    Uterine cancer Maternal Aunt    Ovarian cancer Maternal Aunt    Breast cancer Maternal Aunt        13s   Breast cancer Cousin 51   Uterine cancer Cousin 50   Colon polyps Neg Hx    Colon cancer Neg Hx    Esophageal cancer Neg Hx    Stomach cancer Neg Hx     Social History Social History   Tobacco Use   Smoking status: Never   Smokeless tobacco: Never  Vaping Use   Vaping status: Never Used  Substance Use Topics   Alcohol use: Not Currently    Alcohol/week: 7.0 standard drinks of alcohol    Types: 7 Standard drinks or equivalent per week    Comment: occassional   Drug use: No   Allergies Patient has no known allergies.  Review of Systems A thorough review of systems was obtained and all systems  are negative except as noted in the HPI and PMH.   Physical Exam Vital Signs  I have reviewed the triage vital signs BP 107/72   Pulse (!) 54   Temp 98.5 F (36.9 C) (Oral)   Resp 18   Ht 5' 11 (1.803 m)   Wt 92.5 kg   SpO2 95%   BMI 28.45 kg/m  Physical Exam General: Sitting up in bed, NAD Eyes: PERRLA, anicteric sclera ENTM: Dry lips but moist oral mucosa Neck: Supple, non-tender Cardiovascular: RRR without murmur Respiratory: CTAB. Normal WOB on RA Gastrointestinal: Soft, non-distended. TTP diffusely, worse of epigastric and RUQ. No rebound tenderness or guarding. MSK: No peripheral edema Derm: Warm, dry, no rashes noted Neuro: Motor and sensation intact globally Psych: Cooperative, pleasant   ED Results and Treatments Labs (all labs ordered are listed, but only abnormal results are displayed) Labs Reviewed  COMPREHENSIVE METABOLIC PANEL WITH GFR - Abnormal; Notable for the following components:      Result Value   Potassium 3.4 (*)    Glucose, Bld 105 (*)    Calcium  8.8 (*)    Alkaline Phosphatase 138 (*)    All other components within normal limits  CBC WITH DIFFERENTIAL/PLATELET - Abnormal; Notable for the following components:   Hemoglobin 10.8 (*)    HCT 35.7 (*)    MCV 79.2 (*)    MCH 23.9 (*)    All other components within normal limits  LIPASE, BLOOD  Radiology CT Angio Chest PE W and/or Wo Contrast Result Date: 11/23/2023 CLINICAL DATA:  Chest pain, shortness of breath EXAM: CT ANGIOGRAPHY CHEST WITH CONTRAST TECHNIQUE: Multidetector CT imaging of the chest was performed using the standard protocol during bolus administration of intravenous contrast. Multiplanar CT image reconstructions and MIPs were obtained to evaluate the vascular anatomy. RADIATION DOSE REDUCTION: This exam was performed according to the departmental  dose-optimization program which includes automated exposure control, adjustment of the mA and/or kV according to patient size and/or use of iterative reconstruction technique. CONTRAST:  OMNIPAQUE  IOHEXOL  350 MG/ML SOLN COMPARISON:  None Available. FINDINGS: Cardiovascular: Satisfactory opacification of the pulmonary arteries to the segmental level. No evidence of pulmonary embolism. Normal heart size. No pericardial effusion. Mediastinum/Nodes: No enlarged mediastinal, hilar, or axillary lymph nodes. Thyroid  gland, trachea, and esophagus demonstrate no significant findings. Lungs/Pleura: There is an ill-defined nodular pattern throughout both lungs with small small nodular densities that measured between a few mm up to 4 mm bilaterally. These findings in combination with the previously described findings of metastatic bone disease raises the possibility of metastatic lung disease. There is no other infiltrates or consolidations or pleural effusions. Upper Abdomen: No acute abnormality. Musculoskeletal: Diffuse sclerotic metastatic bone disease of the thoracic spine, sternum and ribs. Review of the MIP images confirms the above findings. IMPRESSION: *No evidence of pulmonary embolism. *Diffuse sclerotic metastatic bone disease. *Ill-defined nodular pattern throughout both lungs with small nodular densities that measured between a few mm up to 4 mm bilaterally. These findings in combination with the previously described findings of metastatic bone disease raises the possibility of metastatic lung disease. Electronically Signed   By: Franky Chard M.D.   On: 11/23/2023 15:20   CT ABDOMEN PELVIS W CONTRAST Result Date: 11/23/2023 CLINICAL DATA:  Abdominal pain EXAM: CT ABDOMEN AND PELVIS WITH CONTRAST TECHNIQUE: Multidetector CT imaging of the abdomen and pelvis was performed using the standard protocol following bolus administration of intravenous contrast. RADIATION DOSE REDUCTION: This exam was performed  according to the departmental dose-optimization program which includes automated exposure control, adjustment of the mA and/or kV according to patient size and/or use of iterative reconstruction technique. CONTRAST:  OMNIPAQUE  IOHEXOL  350 MG/ML SOLN COMPARISON:  CT abdomen and pelvis August 23, 2022 FINDINGS: Lower chest: No infiltrates or consolidations, no pleural effusions Hepatobiliary: Liver normal size no masses no biliary dilatation. Gallbladder unremarkable. No gallstones. Pancreas: Pancreas normal size. No masses calcifications or inflammatory changes. Spleen: Spleen normal size.  No masses. Adrenals/Urinary Tract: Adrenal glands are normal size. Follow-up recommended. Kidneys are normal. No masses calcifications or hydronephrosis Stomach/Bowel: No small or large bowel obstruction or inflammatory changes. Diffuse pan diverticulosis unchanged since prior. Moderate amount of residual fecal material throughout the colon without obstruction or constipation. Vascular/Lymphatic: No significant vascular findings are present. No enlarged abdominal or pelvic lymph nodes. Reproductive: .  No masses. Bladder unremarkable. Other: Anterior abdominal wall unremarkable without evidence of umbilical or inguinal hernias Musculoskeletal: Again noted multiple sclerotic lesions involving the thoracolumbar spine with fracture deformity of L3 correlates with metastatic bone disease. IMPRESSION: *No acute findings in the abdomen or pelvis. *Diffuse pan diverticulosis unchanged since prior. *Moderate amount of residual fecal material throughout the colon without obstruction or constipation. *Again noted multiple sclerotic lesions involving the thoracolumbar spine with fracture deformity of L3 correlates with metastatic bone disease. Electronically Signed   By: Franky Chard M.D.   On: 11/23/2023 15:17    Pertinent labs & imaging results that were  available during my care of the patient were reviewed by me and considered in  my medical decision making (see MDM for details).  Medications Ordered in ED Medications  ondansetron  (ZOFRAN ) injection 4 mg (4 mg Intravenous Given 11/24/23 0909)  lactated ringers  bolus 1,000 mL (0 mLs Intravenous Stopped 11/24/23 1143)  Glycerin  (Adult) 2.1 g suppository 1 suppository (1 suppository Rectal Given 11/24/23 0911)  potassium chloride  10 mEq in 100 mL IVPB (0 mEq Intravenous Stopped 11/24/23 1411)  promethazine  (PHENERGAN ) 6.25 mg/NS 50 mL IVPB (0 mg Intravenous Stopped 11/24/23 1108)  dexamethasone  (DECADRON ) injection 10 mg (10 mg Intravenous Given 11/24/23 1052)  pantoprazole  (PROTONIX ) injection 40 mg (40 mg Intravenous Given 11/24/23 1052)  polyethylene glycol (MIRALAX  / GLYCOLAX ) packet 17 g (17 g Oral Given 11/24/23 1345)                                                                                                                                      Medical Decision Making / ED Course  Medical Decision Making:    KEHLANI VANCAMP is a 53 y.o. female with PMHX metastatic breast cancer on chemotherapy, asthma, GERD, HLD, HTN and SVT presents with intractable nausea and vomiting. The complaint involves an extensive differential diagnosis and also carries with it a high risk of complications and morbidity.  Serious etiology was considered. Ddx includes but is not limited to: SBO, fecal impaction, Mallory-Weiss tear, chemotherapy induced N/V, gastric ulcer, cholecystitis  Complete initial physical exam performed, notably the patient was in no distress.    Reviewed and confirmed nursing documentation for past medical history, family history, social history.  Vital signs reviewed.     Brief summary:  53 year old female with history as above including metastatic breast cancer on chemotherapy presenting with intractable nausea and vomiting.  Vital signs unremarkable.  Seen yesterday for the same concern, CTPE and CTAP without acute etiology. Constipation likely contributing, will order per  rectum bowel clean out. Symptomatic management with Zofran  and IV rehydration. May need admission give persistence of symptoms despite supportive care.   Clinical Course as of 11/24/23 1443  Thu Nov 24, 2023  0953 Hypokalemia to 3.4, given KCl IV x 3 runs. [KH]  1027 Minimal symptomatic improvement with Zofran .  Will add Decadron , Phenergan  and PPI for additional symptom control. [KH]  1200 Significant improvement with additional antiemetics, will PO trial [KH]  1443 Failed PO trial, continued nausea and dry heaving. Will admit for intractable nausea and vomiting. [KH]    Clinical Course User Index [KH] Theophilus Pagan, MD    Additional history obtained: -External records from outside source obtained and reviewed including: Chart review including previous notes, labs, imaging, consultation notes including: Oncology and [facility] notes   Lab Tests: -I ordered, reviewed, and interpreted labs.   The pertinent results include:   Labs Reviewed  COMPREHENSIVE METABOLIC PANEL WITH GFR - Abnormal; Notable for the following  components:      Result Value   Potassium 3.4 (*)    Glucose, Bld 105 (*)    Calcium  8.8 (*)    Alkaline Phosphatase 138 (*)    All other components within normal limits  CBC WITH DIFFERENTIAL/PLATELET - Abnormal; Notable for the following components:   Hemoglobin 10.8 (*)    HCT 35.7 (*)    MCV 79.2 (*)    MCH 23.9 (*)    All other components within normal limits  LIPASE, BLOOD    Notable for hypokalemia to 3.4.  EKG   EKG Interpretation Date/Time:  Thursday November 24 2023 09:18:51 EDT Ventricular Rate:  58 PR Interval:  130 QRS Duration:  104 QT Interval:  446 QTC Calculation: 439 R Axis:   -34  Text Interpretation: Sinus rhythm Left axis deviation Abnormal R-wave progression, early transition Confirmed by Pamella Sharper [phone]) on 11/24/2023 10:24:41 AM        Medicines ordered and prescription drug management: Meds ordered this encounter  Medications    ondansetron  (ZOFRAN ) injection 4 mg   lactated ringers  bolus 1,000 mL   Glycerin  (Adult) 2.1 g suppository 1 suppository   potassium chloride  10 mEq in 100 mL IVPB   promethazine  (PHENERGAN ) 6.25 mg/NS 50 mL IVPB   dexamethasone  (DECADRON ) injection 10 mg   pantoprazole  (PROTONIX ) injection 40 mg   polyethylene glycol (MIRALAX  / GLYCOLAX ) packet 17 g    -I have reviewed the patients home medicines and have made adjustments as needed   Consultations Obtained: I requested consultation with the hospitalist for admission, and discussed lab and imaging findings as well as pertinent plan.   Cardiac Monitoring: The patient was maintained on a cardiac monitor.  I personally viewed and interpreted the cardiac monitored which showed an underlying rhythm of: NSR Continuous pulse oximetry interpreted by myself, 97% on RA.    Social Determinants of Health:  Diagnosis or treatment significantly limited by social determinants of health: obesity and lives alone   Reevaluation: After the interventions noted above, I reevaluated the patient and found that they have improved  Co morbidities that complicate the patient evaluation  Past Medical History:  Diagnosis Date   Allergy    seasonal allergies   Anxiety    on meds   Asthma    uses inhaler   Breast cancer (HCC) 2012   RIGHT lumpectomy   Cancer (HCC) 2022   RIGHT breast lump-dx 2022   Depression    on meds   DVT (deep venous thrombosis) (HCC) 2010   after hysterectomy   Family history of uterine cancer    GERD (gastroesophageal reflux disease)    with certain foods/OTC PRN meds   Headache(784.0)    History of radiation therapy    Bilateral breast- 09/03/21-11/02/21- Dr. Lynwood Nasuti   Hyperlipidemia    on meds   Hypertension    on meds   SVT (supraventricular tachycardia) (HCC)       Dispostion: Disposition decision including need for hospitalization was considered, and patient admitted to the hospital.    Final  Clinical Impression(s) / ED Diagnoses Final diagnoses:  Hematemesis with nausea        Theophilus Pagan, MD 11/24/23 1511    Pamella Sharper LABOR, DO 12/16/23 1641

## 2023-11-24 NOTE — ED Triage Notes (Signed)
 Pt seen yesterday for same, has not stopped vomiting, went for PET scan and was told to come back to the ED due to emesis

## 2023-11-24 NOTE — ED Notes (Signed)
 ED TO INPATIENT HANDOFF REPORT  ED Nurse Name and Phone #: Emmette Harms EMTP  S Name/Age/Gender Chelsea Hansen 53 y.o. female Room/Bed: WA02/WA02  Code Status   Code Status: Prior  Home/SNF/Other Home Patient oriented to: self, place, time, and situation Is this baseline? Yes   Triage Complete: Triage complete  Chief Complaint Intractable nausea and vomiting [R11.2]  Triage Note Pt seen yesterday for same, has not stopped vomiting, went for PET scan and was told to come back to the ED due to emesis   Allergies No Known Allergies  Level of Care/Admitting Diagnosis ED Disposition     ED Disposition  Admit   Condition  --   Comment  Hospital Area: [facility] [[id number]]  Level of Care: Med-Surg [16]  May place patient in observation at [facility] or [facility] if equivalent level of care is available:: No  Covid Evaluation: Asymptomatic - no recent exposure (last 10 days) testing not required  Diagnosis: Intractable nausea and vomiting [720114]  Admitting Physician: Vita Grip [[id number]]  Attending Physician: Vita Grip [[id number]]          B Medical/Surgery History Past Medical History:  Diagnosis Date   Allergy    seasonal allergies   Anxiety    on meds   Asthma    uses inhaler   Breast cancer (HCC) 2012   RIGHT lumpectomy   Cancer (HCC) 2022   RIGHT breast lump-dx 2022   Depression    on meds   DVT (deep venous thrombosis) (HCC) 2010   after hysterectomy   Family history of uterine cancer    GERD (gastroesophageal reflux disease)    with certain foods/OTC PRN meds   Headache(784.0)    History of radiation therapy    Bilateral breast- 09/03/21-11/02/21- Dr. Retta Caster   Hyperlipidemia    on meds   Hypertension    on meds   SVT (supraventricular tachycardia) (HCC)    Past Surgical History:  Procedure Laterality Date   ABDOMINAL HYSTERECTOMY     AXILLARY SENTINEL NODE BIOPSY Left 07/09/2021   Procedure: LEFT  AXILLARY SENTINEL NODE BIOPSY;  Surgeon: Oza Blumenthal, MD;  Location: [facility];  Service: General;  Laterality: Left;   BIOPSY  06/29/2022   Procedure: BIOPSY;  Surgeon: Albertina Hugger, MD;  Location: [facility];  Service: Gastroenterology;;   BREAST EXCISIONAL BIOPSY Right 09/16/2009   BREAST LUMPECTOMY WITH RADIOACTIVE SEED LOCALIZATION Bilateral 07/09/2021   Procedure: BILATERAL BREAST LUMPECTOMY WITH RADIOACTIVE SEED LOCALIZATION;  Surgeon: Oza Blumenthal, MD;  Location: [facility];  Service: General;  Laterality: Bilateral;   BREAST SURGERY     lumpectomy   ESOPHAGOGASTRODUODENOSCOPY (EGD) WITH PROPOFOL  N/A 06/29/2022   Procedure: ESOPHAGOGASTRODUODENOSCOPY (EGD) WITH PROPOFOL ;  Surgeon: Albertina Hugger, MD;  Location: [facility];  Service: Gastroenterology;  Laterality: N/A;   PORT-A-CATH REMOVAL Left 07/09/2021   Procedure: REMOVAL PORT-A-CATH;  Surgeon: Oza Blumenthal, MD;  Location: [facility];  Service: General;  Laterality: Left;   PORTACATH PLACEMENT Left 01/26/2021   Procedure: INSERTION PORT-A-CATH;  Surgeon: Oza Blumenthal, MD;  Location: [facility];  Service: General;  Laterality: Left;   RADIOACTIVE SEED GUIDED AXILLARY SENTINEL LYMPH NODE Right 07/09/2021   Procedure: RADIOACTIVE SEED GUIDED RIGHT AXILLARY SENTINEL LYMPH NODE DISSECTION;  Surgeon: Oza Blumenthal, MD;  Location:  SURGERY CENTER;  Service: General;  Laterality: Right;   TUBAL LIGATION       A IV Location/Drains/Wounds  Patient Lines/Drains/Airways Status     Active Line/Drains/Airways     Name Placement date Placement time Site Days   Peripheral IV 11/24/23 20 G 1" Left Antecubital 11/24/23  0855  Antecubital  less than 1            Intake/Output Last 24 hours No intake or output data in the 24 hours ending 11/24/23 1621  Labs/Imaging Results for orders placed or performed during the hospital encounter of 11/24/23 (from the  past 48 hours)  Comprehensive metabolic panel     Status: Abnormal   Collection Time: 11/24/23  8:58 AM  Result Value Ref Range   Sodium 138 135 - 145 mmol/L   Potassium 3.4 (L) 3.5 - 5.1 mmol/L   Chloride 99 98 - 111 mmol/L   CO2 30 22 - 32 mmol/L   Glucose, Bld 105 (H) 70 - 99 mg/dL    Comment: Glucose reference range applies only to samples taken after fasting for at least 8 hours.   BUN 15 6 - 20 mg/dL   Creatinine, Ser 1.61 0.44 - 1.00 mg/dL   Calcium  8.8 (L) 8.9 - 10.3 mg/dL   Total Protein 7.7 6.5 - 8.1 g/dL   Albumin 3.9 3.5 - 5.0 g/dL   AST 31 15 - 41 U/L   ALT 32 0 - 44 U/L   Alkaline Phosphatase 138 (H) 38 - 126 U/L   Total Bilirubin 0.5 0.0 - 1.2 mg/dL   GFR, Estimated >09 >60 mL/min    Comment: (NOTE) Calculated using the CKD-EPI Creatinine Equation (2021)    Anion gap 9 5 - 15    Comment: Performed at [facility], [address] [place], [place] [address]  CBC with Differential     Status: Abnormal   Collection Time: 11/24/23  8:58 AM  Result Value Ref Range   WBC 4.3 4.0 - 10.5 K/uL   RBC 4.51 3.87 - 5.11 MIL/uL   Hemoglobin 10.8 (L) 12.0 - 15.0 g/dL   HCT 81.1 (L) 91.4 - 78.2 %   MCV 79.2 (L) 80.0 - 100.0 fL   MCH 23.9 (L) 26.0 - 34.0 pg   MCHC 30.3 30.0 - 36.0 g/dL   RDW 95.6 21.3 - 08.6 %   Platelets 243 150 - 400 K/uL   nRBC 0.0 0.0 - 0.2 %   Neutrophils Relative % 62 %   Neutro Abs 2.7 1.7 - 7.7 K/uL   Lymphocytes Relative 22 %   Lymphs Abs 0.9 0.7 - 4.0 K/uL   Monocytes Relative 13 %   Monocytes Absolute 0.6 0.1 - 1.0 K/uL   Eosinophils Relative 1 %   Eosinophils Absolute 0.1 0.0 - 0.5 K/uL   Basophils Relative 1 %   Basophils Absolute 0.0 0.0 - 0.1 K/uL   Immature Granulocytes 1 %   Abs Immature Granulocytes 0.03 0.00 - 0.07 K/uL    Comment: Performed at [facility], [address], [place], [place] [address]  Lipase, blood     Status: None   Collection Time: 11/24/23  8:58 AM  Result Value Ref Range    Lipase 44 11 - 51 U/L    Comment: Performed at [facility], [address], [place], [place] [address]   *Note: Due to a large number of results and/or encounters for the requested time period, some results have not been displayed. A complete set of results can be found in Results Review.   CT Angio Chest PE W and/or  Wo Contrast Result Date: 11/23/2023 CLINICAL DATA:  Chest pain, shortness of breath EXAM: CT ANGIOGRAPHY CHEST WITH CONTRAST TECHNIQUE: Multidetector CT imaging of the chest was performed using the standard protocol during bolus administration of intravenous contrast. Multiplanar CT image reconstructions and MIPs were obtained to evaluate the vascular anatomy. RADIATION DOSE REDUCTION: This exam was performed according to the departmental dose-optimization program which includes automated exposure control, adjustment of the mA and/or kV according to patient size and/or use of iterative reconstruction technique. CONTRAST:  OMNIPAQUE  IOHEXOL  350 MG/ML SOLN COMPARISON:  None Available. FINDINGS: Cardiovascular: Satisfactory opacification of the pulmonary arteries to the segmental level. No evidence of pulmonary embolism. Normal heart size. No pericardial effusion. Mediastinum/Nodes: No enlarged mediastinal, hilar, or axillary lymph nodes. Thyroid  gland, trachea, and esophagus demonstrate no significant findings. Lungs/Pleura: There is an ill-defined nodular pattern throughout both lungs with small small nodular densities that measured between a few mm up to 4 mm bilaterally. These findings in combination with the previously described findings of metastatic bone disease raises the possibility of metastatic lung disease. There is no other infiltrates or consolidations or pleural effusions. Upper Abdomen: No acute abnormality. Musculoskeletal: Diffuse sclerotic metastatic bone disease of the thoracic spine, sternum and ribs. Review of the MIP images confirms the above  findings. IMPRESSION: *No evidence of pulmonary embolism. *Diffuse sclerotic metastatic bone disease. *Ill-defined nodular pattern throughout both lungs with small nodular densities that measured between a few mm up to 4 mm bilaterally. These findings in combination with the previously described findings of metastatic bone disease raises the possibility of metastatic lung disease. Electronically Signed   By: Fredrich Jefferson M.D.   On: 11/23/2023 15:20   CT ABDOMEN PELVIS W CONTRAST Result Date: 11/23/2023 CLINICAL DATA:  Abdominal pain EXAM: CT ABDOMEN AND PELVIS WITH CONTRAST TECHNIQUE: Multidetector CT imaging of the abdomen and pelvis was performed using the standard protocol following bolus administration of intravenous contrast. RADIATION DOSE REDUCTION: This exam was performed according to the departmental dose-optimization program which includes automated exposure control, adjustment of the mA and/or kV according to patient size and/or use of iterative reconstruction technique. CONTRAST:  OMNIPAQUE  IOHEXOL  350 MG/ML SOLN COMPARISON:  CT abdomen and pelvis August 23, 2022 FINDINGS: Lower chest: No infiltrates or consolidations, no pleural effusions Hepatobiliary: Liver normal size no masses no biliary dilatation. Gallbladder unremarkable. No gallstones. Pancreas: Pancreas normal size. No masses calcifications or inflammatory changes. Spleen: Spleen normal size.  No masses. Adrenals/Urinary Tract: Adrenal glands are normal size. Follow-up recommended. Kidneys are normal. No masses calcifications or hydronephrosis Stomach/Bowel: No small or large bowel obstruction or inflammatory changes. Diffuse pan diverticulosis unchanged since prior. Moderate amount of residual fecal material throughout the colon without obstruction or constipation. Vascular/Lymphatic: No significant vascular findings are present. No enlarged abdominal or pelvic lymph nodes. Reproductive: .  No masses. Bladder unremarkable. Other:  Anterior abdominal wall unremarkable without evidence of umbilical or inguinal hernias Musculoskeletal: Again noted multiple sclerotic lesions involving the thoracolumbar spine with fracture deformity of L3 correlates with metastatic bone disease. IMPRESSION: *No acute findings in the abdomen or pelvis. *Diffuse pan diverticulosis unchanged since prior. *Moderate amount of residual fecal material throughout the colon without obstruction or constipation. *Again noted multiple sclerotic lesions involving the thoracolumbar spine with fracture deformity of L3 correlates with metastatic bone disease. Electronically Signed   By: Fredrich Jefferson M.D.   On: 11/23/2023 15:17    Pending Labs Unresulted Labs (From admission, onward)    None  Vitals/Pain Today's Vitals   11/24/23 1142 11/24/23 1200 11/24/23 1300 11/24/23 1500  BP: 133/88 (!) 147/89 107/72 (!) 137/96  Pulse: (!) 52 (!) 50 (!) 54 63  Resp: 16 17 18  (!) 21  Temp: 98.5 F (36.9 C)     TempSrc: Oral     SpO2: 97% 97% 95% 99%  Weight:      Height:      PainSc:        Isolation Precautions No active isolations  Medications Medications  ondansetron  (ZOFRAN ) injection 4 mg (4 mg Intravenous Given 11/24/23 0909)  lactated ringers  bolus 1,000 mL (0 mLs Intravenous Stopped 11/24/23 1143)  Glycerin (Adult) 2.1 g suppository 1 suppository (1 suppository Rectal Given 11/24/23 0911)  potassium chloride  10 mEq in 100 mL IVPB (0 mEq Intravenous Stopped 11/24/23 1411)  promethazine  (PHENERGAN ) 6.25 mg/NS 50 mL IVPB (0 mg Intravenous Stopped 11/24/23 1108)  dexamethasone  (DECADRON ) injection 10 mg (10 mg Intravenous Given 11/24/23 1052)  pantoprazole  (PROTONIX ) injection 40 mg (40 mg Intravenous Given 11/24/23 1052)  polyethylene glycol (MIRALAX  / GLYCOLAX ) packet 17 g (17 g Oral Given 11/24/23 1345)    Mobility walks     Focused Assessments See Chart   R Recommendations: See Admitting Provider Note  Report given to:

## 2023-11-24 NOTE — ED Notes (Signed)
 Nurse gave patient [place] sandwich, ginger ale and crackers for PO challenge

## 2023-11-24 NOTE — Progress Notes (Signed)
 Chelsea Hansen has been admitted to [facility] on 11/24/2023.   Treatment Team:  Attending Provider: Amponsah, Prosper M, MD

## 2023-11-24 NOTE — H&P (Signed)
 History and Physical  Chelsea Hansen ION:[id number] DOB: 04-19-1971 DOA: 11/24/2023  PCP: Marius Siemens, NP   Chief Complaint: Nausea and vomiting  HPI: Chelsea Hansen is a 53 y.o. female with medical history significant for metastatic breast cancer to the bone on chemotherapy, asthma, GERD, HLD, HTN, PSVT, bradycardia, chronic pain syndrome, chronic constipation, anxiety and depression who presents to the ED for evaluation of persistent nausea and vomiting.  Patient reports that over the last 3 weeks, she has had intractable nausea and vomiting.  Her symptoms have worsened over the last 10 days and she has not had any bowel movement since then.  She was evaluated the ED yesterday and workup was only significant for moderate constipation so patient was discharged home with Zofran .  She reports that at 9, she had multiple nausea and vomiting episodes has not been able to keep anything down since then.  She went for her PET scan and was advised to present to the ED for further evaluation.  She has associated abdominal pain but denies any fevers, chills, shortness of breath, hematuria or dysuria.  ED Course: Initial vitals shows patient afebrile and normotensive with SpO2 of 100% on room air.  Labs show CBG 100, K+ 3.4, Alkaline phosphate 138, stable anemia with Hgb of 10.8, normal lipase and white count.  EKG shows sinus bradycardia with HR 57.  CT A/P from 6/4 shows moderate amount of residual fecal nycturia throughout the colon without obstruction or constipation but no acute intra-abdominal findings.  CTA chest PE study positive from 6/4 negative for PE. Patient received IV LR 1 L bolus x 1, IV Phenergan  6.25 mg x 1, IV Protonix  40 mg x 1, rectal glycerin, IV Decadron  10 mg x 1 and MiraLAX  17 g x 1. [facility] is consulted for admission.   Review of Systems: Please see HPI for pertinent positives and negatives. A complete 10 system review of systems are otherwise negative.  Past Medical History:   Diagnosis Date   Allergy    seasonal allergies   Anxiety    on meds   Asthma    uses inhaler   Breast cancer (HCC) 2012   RIGHT lumpectomy   Cancer (HCC) 2022   RIGHT breast lump-dx 2022   Depression    on meds   DVT (deep venous thrombosis) (HCC) 2010   after hysterectomy   Family history of uterine cancer    GERD (gastroesophageal reflux disease)    with certain foods/OTC PRN meds   Headache(784.0)    History of radiation therapy    Bilateral breast- 09/03/21-11/02/21- Dr. Retta Caster   Hyperlipidemia    on meds   Hypertension    on meds   SVT (supraventricular tachycardia) (HCC)    Past Surgical History:  Procedure Laterality Date   ABDOMINAL HYSTERECTOMY     AXILLARY SENTINEL NODE BIOPSY Left 07/09/2021   Procedure: LEFT AXILLARY SENTINEL NODE BIOPSY;  Surgeon: Oza Blumenthal, MD;  Location: [facility];  Service: General;  Laterality: Left;   BIOPSY  06/29/2022   Procedure: BIOPSY;  Surgeon: Albertina Hugger, MD;  Location: [facility];  Service: Gastroenterology;;   BREAST EXCISIONAL BIOPSY Right 09/16/2009   BREAST LUMPECTOMY WITH RADIOACTIVE SEED LOCALIZATION Bilateral 07/09/2021   Procedure: BILATERAL BREAST LUMPECTOMY WITH RADIOACTIVE SEED LOCALIZATION;  Surgeon: Oza Blumenthal, MD;  Location: [facility];  Service: General;  Laterality: Bilateral;   BREAST SURGERY     lumpectomy   ESOPHAGOGASTRODUODENOSCOPY (EGD) WITH  PROPOFOL  N/A 06/29/2022   Procedure: ESOPHAGOGASTRODUODENOSCOPY (EGD) WITH PROPOFOL ;  Surgeon: Albertina Hugger, MD;  Location: [facility];  Service: Gastroenterology;  Laterality: N/A;   PORT-A-CATH REMOVAL Left 07/09/2021   Procedure: REMOVAL PORT-A-CATH;  Surgeon: Oza Blumenthal, MD;  Location: [facility];  Service: General;  Laterality: Left;   PORTACATH PLACEMENT Left 01/26/2021   Procedure: INSERTION PORT-A-CATH;  Surgeon: Oza Blumenthal, MD;  Location: [facility];  Service: General;   Laterality: Left;   RADIOACTIVE SEED GUIDED AXILLARY SENTINEL LYMPH NODE Right 07/09/2021   Procedure: RADIOACTIVE SEED GUIDED RIGHT AXILLARY SENTINEL LYMPH NODE DISSECTION;  Surgeon: Oza Blumenthal, MD;  Location: [facility];  Service: General;  Laterality: Right;   TUBAL LIGATION     Social History:  reports that she has never smoked. She has never used smokeless tobacco. She reports that she does not currently use alcohol after a past usage of about 7.0 standard drinks of alcohol per week. She reports that she does not use drugs.  No Known Allergies  Family History  Problem Relation Age of Onset   Breast cancer Mother        37s   Hypertension Father    Uterine cancer Maternal Aunt    Ovarian cancer Maternal Aunt    Breast cancer Maternal Aunt        67s   Breast cancer Cousin 72   Uterine cancer Cousin 54   Colon polyps Neg Hx    Colon cancer Neg Hx    Esophageal cancer Neg Hx    Stomach cancer Neg Hx      Prior to Admission medications   Medication Sig Start Date End Date Taking? Authorizing Provider  abemaciclib  (VERZENIO ) 50 MG tablet Take 1 tablet (50 mg total) by mouth 2 (two) times daily. 06/08/23   [name], [name], MD  albuterol  (VENTOLIN  HFA) 108 (90 Base) MCG/ACT inhaler Inhale 2 puffs by mouth into the lungs every 6 hours as needed for wheezing or shortness of breath. 03/18/23   Marius Siemens, NP  amLODipine  (NORVASC ) 5 MG tablet Take 1 tablet (5 mg total) by mouth daily. 11/18/23   Marius Siemens, NP  anastrozole  (ARIMIDEX ) 1 MG tablet Take 1 tablet (1 mg total) by mouth daily. 04/18/23   Causey, Laura Polio, NP  calcium -vitamin D (OSCAL WITH D) 500-5 MG-MCG tablet Take 1 tablet by mouth daily with supper. 06/24/22   [name], [name], MD  gabapentin  (NEURONTIN ) 300 MG capsule Take 1 capsule (300 mg total) by mouth 3 (three) times daily. 05/26/23   [name], [name], MD  montelukast  (SINGULAIR ) 10 MG tablet Take 1 tablet (10 mg total) by mouth at  bedtime. 05/01/23   Marius Siemens, NP  ondansetron  (ZOFRAN -ODT) 4 MG disintegrating tablet Dissolve 1 tablet (4 mg total) by mouth every 8 (eight) hours as needed for nausea or vomiting. 11/23/23   Kendrick Pax, PA-C  oxyCODONE  (OXYCONTIN ) 10 mg 12 hr tablet Take 1 tablet (10 mg total) by mouth every 12 (twelve) hours. 10/25/23   [name], Athena N, NP  oxyCODONE -acetaminophen  (PERCOCET) 7.5-325 MG tablet Take 1-2 tablets by mouth every 6 (six) hours as needed for severe pain (pain score 7-10). 11/18/23   [name], Athena N, NP  pantoprazole  (PROTONIX ) 40 MG tablet Take 1 tablet (40 mg total) by mouth daily. 06/30/22   [name], Hurman Maiden, MD  polyethylene glycol (MIRALAX  / GLYCOLAX ) 17 g packet Take 17 g by mouth 2 (two) times daily. 06/30/22   [name],  Ripudeep K, MD  prochlorperazine  (COMPAZINE ) 10 MG tablet Take 1 tablet (10 mg total) by mouth every 6 (six) hours as needed for nausea or vomiting. 06/30/22   [name], Hurman Maiden, MD  propranolol  (INDERAL ) 10 MG tablet Take 1 tablet (10 mg total) by mouth 2 (two) times daily. Please call [phone] to schedule an appointment for future refills. Thank you. 3rd attempt. 06/01/23   [name], [name], DO  senna (SENOKOT) 8.6 MG TABS tablet Take 2 tablets (17.2 mg total) by mouth at bedtime. 06/30/22   Loma Rising, MD    Physical Exam: BP (!) 137/96   Pulse 63   Temp 98.5 F (36.9 C) (Oral)   Resp (!) 21   Ht 5\' 11"  (1.803 m)   Wt 92.5 kg   SpO2 99%   BMI 28.45 kg/m  General: Pleasant, well-appearing middle-age woman laying in bed. No acute distress. HEENT: [place]/AT. Anicteric sclera.  Dry mucous membrane. CV: RRR. No murmurs, rubs, or gallops. No LE edema Pulmonary: Lungs CTAB. Normal effort. No wheezing or rales. Abdominal: Soft,.  Mild distention.  Generalized tenderness to palpation. Normal bowel sounds. Extremities: Palpable radial and DP pulses. Normal ROM. Skin: Warm and dry. No obvious rash or lesions. Neuro: A&Ox3. Moves all  extremities. Normal sensation to light touch. No focal deficit. Psych: Normal mood and affect          Labs on Admission:  Basic Metabolic Panel: Recent Labs  Lab 11/23/23 1241 11/24/23 0858  NA 138 138  K 3.3* 3.4*  CL 99 99  CO2 28 30  GLUCOSE 98 105*  BUN 18 15  CREATININE 0.84 0.85  CALCIUM  9.1 8.8*   Liver Function Tests: Recent Labs  Lab 11/23/23 1241 11/24/23 0858  AST 32 31  ALT 31 32  ALKPHOS 144* 138*  BILITOT 0.8 0.5  PROT 8.3* 7.7  ALBUMIN 4.2 3.9   Recent Labs  Lab 11/23/23 1241 11/24/23 0858  LIPASE 39 44   No results for input(s): "AMMONIA" in the last 168 hours. CBC: Recent Labs  Lab 11/23/23 1241 11/24/23 0858  WBC 4.5 4.3  NEUTROABS  --  2.7  HGB 10.7* 10.8*  HCT 34.4* 35.7*  MCV 77.5* 79.2*  PLT 269 243   Cardiac Enzymes: No results for input(s): "CKTOTAL", "CKMB", "CKMBINDEX", "TROPONINI" in the last 168 hours. BNP (last 3 results) No results for input(s): "BNP" in the last 8760 hours.  ProBNP (last 3 results) No results for input(s): "PROBNP" in the last 8760 hours.  CBG: Recent Labs  Lab 11/24/23 0639  GLUCAP 100*    Radiological Exams on Admission: CT Angio Chest PE W and/or Wo Contrast Result Date: 11/23/2023 CLINICAL DATA:  Chest pain, shortness of breath EXAM: CT ANGIOGRAPHY CHEST WITH CONTRAST TECHNIQUE: Multidetector CT imaging of the chest was performed using the standard protocol during bolus administration of intravenous contrast. Multiplanar CT image reconstructions and MIPs were obtained to evaluate the vascular anatomy. RADIATION DOSE REDUCTION: This exam was performed according to the departmental dose-optimization program which includes automated exposure control, adjustment of the mA and/or kV according to patient size and/or use of iterative reconstruction technique. CONTRAST:  OMNIPAQUE  IOHEXOL  350 MG/ML SOLN COMPARISON:  None Available. FINDINGS: Cardiovascular: Satisfactory opacification of the pulmonary  arteries to the segmental level. No evidence of pulmonary embolism. Normal heart size. No pericardial effusion. Mediastinum/Nodes: No enlarged mediastinal, hilar, or axillary lymph nodes. Thyroid  gland, trachea, and esophagus demonstrate no significant findings. Lungs/Pleura: There is an ill-defined nodular pattern  throughout both lungs with small small nodular densities that measured between a few mm up to 4 mm bilaterally. These findings in combination with the previously described findings of metastatic bone disease raises the possibility of metastatic lung disease. There is no other infiltrates or consolidations or pleural effusions. Upper Abdomen: No acute abnormality. Musculoskeletal: Diffuse sclerotic metastatic bone disease of the thoracic spine, sternum and ribs. Review of the MIP images confirms the above findings. IMPRESSION: *No evidence of pulmonary embolism. *Diffuse sclerotic metastatic bone disease. *Ill-defined nodular pattern throughout both lungs with small nodular densities that measured between a few mm up to 4 mm bilaterally. These findings in combination with the previously described findings of metastatic bone disease raises the possibility of metastatic lung disease. Electronically Signed   By: Fredrich Jefferson M.D.   On: 11/23/2023 15:20   CT ABDOMEN PELVIS W CONTRAST Result Date: 11/23/2023 CLINICAL DATA:  Abdominal pain EXAM: CT ABDOMEN AND PELVIS WITH CONTRAST TECHNIQUE: Multidetector CT imaging of the abdomen and pelvis was performed using the standard protocol following bolus administration of intravenous contrast. RADIATION DOSE REDUCTION: This exam was performed according to the departmental dose-optimization program which includes automated exposure control, adjustment of the mA and/or kV according to patient size and/or use of iterative reconstruction technique. CONTRAST:  OMNIPAQUE  IOHEXOL  350 MG/ML SOLN COMPARISON:  CT abdomen and pelvis August 23, 2022 FINDINGS: Lower chest:  No infiltrates or consolidations, no pleural effusions Hepatobiliary: Liver normal size no masses no biliary dilatation. Gallbladder unremarkable. No gallstones. Pancreas: Pancreas normal size. No masses calcifications or inflammatory changes. Spleen: Spleen normal size.  No masses. Adrenals/Urinary Tract: Adrenal glands are normal size. Follow-up recommended. Kidneys are normal. No masses calcifications or hydronephrosis Stomach/Bowel: No small or large bowel obstruction or inflammatory changes. Diffuse pan diverticulosis unchanged since prior. Moderate amount of residual fecal material throughout the colon without obstruction or constipation. Vascular/Lymphatic: No significant vascular findings are present. No enlarged abdominal or pelvic lymph nodes. Reproductive: .  No masses. Bladder unremarkable. Other: Anterior abdominal wall unremarkable without evidence of umbilical or inguinal hernias Musculoskeletal: Again noted multiple sclerotic lesions involving the thoracolumbar spine with fracture deformity of L3 correlates with metastatic bone disease. IMPRESSION: *No acute findings in the abdomen or pelvis. *Diffuse pan diverticulosis unchanged since prior. *Moderate amount of residual fecal material throughout the colon without obstruction or constipation. *Again noted multiple sclerotic lesions involving the thoracolumbar spine with fracture deformity of L3 correlates with metastatic bone disease. Electronically Signed   By: Fredrich Jefferson M.D.   On: 11/23/2023 15:17   Assessment/Plan Chelsea Hansen is a 53 y.o. female with medical history significant for metastatic breast cancer to the bone on chemotherapy, asthma, GERD, HLD, HTN, PSVT, bradycardia, chronic pain syndrome, chronic constipation, anxiety and depression who presents to the ED for evaluation of persistent nausea and vomiting admitted for intractable nausea and vomiting  # Intractable nausea and vomiting # Abdominal pain # Constipation -  Presents with nausea and vomiting for the last 3 weeks that has progressed over the last 10 days - Last BM was 10 days ago, CT A/P shows moderate stool burden throughout the colon - Nausea and vomiting likely secondary to constipation - Patient completed MiraLAX  but vomited Dulcolax Per RN - Mag citrate solution, 1 bottle, repeat as needed - IVNS 100 cc/h for 1 day - IV Zofran  as needed for nausea and vomiting - IV Dilaudid  as needed for pain  # Hypokalemia - K+ slightly low at 3.4, repleted with  IV KCl 10 mEq x 3 hours - Follow-up morning CMP, mag and Phos  # GERD - IV PPI  # Metastatic breast cancer to the bone # Chronic pain syndrome - PET scan pending - Resume Verzenio  and anastrozole  when able to tolerate p.o. intake - IV Dilaudid  as needed for pain - Follow-up with oncology in the outpatient  # HTN - Resume amlodipine  with improvement in nausea and vomiting  #PSVT - Resume propranolol  with improvement in nausea and vomiting  # Asthma - Continue as needed albuterol  inhaler  DVT prophylaxis: Lovenox     Code Status: Full Code  Consults called: None  Family Communication: Discussed admission with daughter at bedside  Severity of Illness: The appropriate patient status for this patient is OBSERVATION. Observation status is judged to be reasonable and necessary in order to provide the required intensity of service to ensure the patient's safety. The patient's presenting symptoms, physical exam findings, and initial radiographic and laboratory data in the context of their medical condition is felt to place them at decreased risk for further clinical deterioration. Furthermore, it is anticipated that the patient will be medically stable for discharge from the hospital within 2 midnights of admission.   Level of care: Med-Surg   This record has been created using Conservation officer, historic buildings. Errors have been sought and corrected, but may not always be located. Such  creation errors do not reflect on the standard of care.   Vita Grip, MD 11/24/2023, 4:06 PM [facility] Pager: [phone] Isaiah 41:10   If 7PM-7AM, please contact night-coverage [email or web address] Password [email or web address]

## 2023-11-25 ENCOUNTER — Other Ambulatory Visit (HOSPITAL_COMMUNITY): Payer: Self-pay

## 2023-11-25 DIAGNOSIS — E669 Obesity, unspecified: Secondary | ICD-10-CM | POA: Diagnosis present

## 2023-11-25 DIAGNOSIS — Z803 Family history of malignant neoplasm of breast: Secondary | ICD-10-CM | POA: Diagnosis not present

## 2023-11-25 DIAGNOSIS — R918 Other nonspecific abnormal finding of lung field: Secondary | ICD-10-CM | POA: Diagnosis present

## 2023-11-25 DIAGNOSIS — R001 Bradycardia, unspecified: Secondary | ICD-10-CM | POA: Diagnosis present

## 2023-11-25 DIAGNOSIS — D630 Anemia in neoplastic disease: Secondary | ICD-10-CM | POA: Diagnosis present

## 2023-11-25 DIAGNOSIS — K5903 Drug induced constipation: Secondary | ICD-10-CM | POA: Diagnosis not present

## 2023-11-25 DIAGNOSIS — Z86718 Personal history of other venous thrombosis and embolism: Secondary | ICD-10-CM | POA: Diagnosis not present

## 2023-11-25 DIAGNOSIS — K5900 Constipation, unspecified: Secondary | ICD-10-CM | POA: Diagnosis present

## 2023-11-25 DIAGNOSIS — J45909 Unspecified asthma, uncomplicated: Secondary | ICD-10-CM | POA: Diagnosis present

## 2023-11-25 DIAGNOSIS — I471 Supraventricular tachycardia, unspecified: Secondary | ICD-10-CM

## 2023-11-25 DIAGNOSIS — C7951 Secondary malignant neoplasm of bone: Secondary | ICD-10-CM | POA: Diagnosis present

## 2023-11-25 DIAGNOSIS — K219 Gastro-esophageal reflux disease without esophagitis: Secondary | ICD-10-CM | POA: Diagnosis present

## 2023-11-25 DIAGNOSIS — J4541 Moderate persistent asthma with (acute) exacerbation: Secondary | ICD-10-CM | POA: Diagnosis not present

## 2023-11-25 DIAGNOSIS — R112 Nausea with vomiting, unspecified: Secondary | ICD-10-CM | POA: Diagnosis present

## 2023-11-25 DIAGNOSIS — Z90710 Acquired absence of both cervix and uterus: Secondary | ICD-10-CM | POA: Diagnosis not present

## 2023-11-25 DIAGNOSIS — Z5986 Financial insecurity: Secondary | ICD-10-CM | POA: Diagnosis not present

## 2023-11-25 DIAGNOSIS — C50412 Malignant neoplasm of upper-outer quadrant of left female breast: Secondary | ICD-10-CM | POA: Diagnosis present

## 2023-11-25 DIAGNOSIS — Z79811 Long term (current) use of aromatase inhibitors: Secondary | ICD-10-CM | POA: Diagnosis not present

## 2023-11-25 DIAGNOSIS — I4719 Other supraventricular tachycardia: Secondary | ICD-10-CM | POA: Diagnosis present

## 2023-11-25 DIAGNOSIS — C50911 Malignant neoplasm of unspecified site of right female breast: Secondary | ICD-10-CM | POA: Diagnosis not present

## 2023-11-25 DIAGNOSIS — E876 Hypokalemia: Secondary | ICD-10-CM | POA: Diagnosis present

## 2023-11-25 DIAGNOSIS — E785 Hyperlipidemia, unspecified: Secondary | ICD-10-CM | POA: Diagnosis present

## 2023-11-25 DIAGNOSIS — Z79899 Other long term (current) drug therapy: Secondary | ICD-10-CM | POA: Diagnosis not present

## 2023-11-25 DIAGNOSIS — F32A Depression, unspecified: Secondary | ICD-10-CM | POA: Diagnosis present

## 2023-11-25 DIAGNOSIS — G894 Chronic pain syndrome: Secondary | ICD-10-CM | POA: Diagnosis present

## 2023-11-25 DIAGNOSIS — R110 Nausea: Secondary | ICD-10-CM | POA: Diagnosis present

## 2023-11-25 DIAGNOSIS — I10 Essential (primary) hypertension: Secondary | ICD-10-CM | POA: Diagnosis present

## 2023-11-25 DIAGNOSIS — C50411 Malignant neoplasm of upper-outer quadrant of right female breast: Secondary | ICD-10-CM | POA: Diagnosis present

## 2023-11-25 LAB — COMPREHENSIVE METABOLIC PANEL WITH GFR
ALT: 32 U/L (ref 0–44)
AST: 30 U/L (ref 15–41)
Albumin: 3.7 g/dL (ref 3.5–5.0)
Alkaline Phosphatase: 143 U/L — ABNORMAL HIGH (ref 38–126)
Anion gap: 8 (ref 5–15)
BUN: 10 mg/dL (ref 6–20)
CO2: 24 mmol/L (ref 22–32)
Calcium: 8.9 mg/dL (ref 8.9–10.3)
Chloride: 106 mmol/L (ref 98–111)
Creatinine, Ser: 0.8 mg/dL (ref 0.44–1.00)
GFR, Estimated: >60 mL/min (ref >60)
Glucose, Bld: 113 mg/dL — ABNORMAL HIGH (ref 70–99)
Potassium: 3.4 mmol/L — ABNORMAL LOW (ref 3.5–5.1)
Sodium: 138 mmol/L (ref 135–145)
Total Bilirubin: 0.5 mg/dL (ref 0.0–1.2)
Total Protein: 7.2 g/dL (ref 6.5–8.1)

## 2023-11-25 LAB — CBC
HCT: 32.7 % — ABNORMAL LOW (ref 36.0–46.0)
Hemoglobin: 10.2 g/dL — ABNORMAL LOW (ref 12.0–15.0)
MCH: 24 pg — ABNORMAL LOW (ref 26.0–34.0)
MCHC: 31.2 g/dL (ref 30.0–36.0)
MCV: 76.9 fL — ABNORMAL LOW (ref 80.0–100.0)
Platelets: 256 10*3/uL (ref 150–400)
RBC: 4.25 MIL/uL (ref 3.87–5.11)
RDW: 14.9 % (ref 11.5–15.5)
WBC: 5.7 10*3/uL (ref 4.0–10.5)
nRBC: 0 % (ref 0.0–0.2)

## 2023-11-25 LAB — PHOSPHORUS: Phosphorus: 2.5 mg/dL (ref 2.5–4.6)

## 2023-11-25 LAB — MAGNESIUM: Magnesium: 2.1 mg/dL (ref 1.7–2.4)

## 2023-11-25 MED ORDER — SMOG ENEMA
960.0000 mL | Freq: Once | RECTAL | Status: AC
  Administered 2023-11-25: 960 mL via RECTAL
  Filled 2023-11-25: qty 960

## 2023-11-25 MED ORDER — HYDRALAZINE HCL 20 MG/ML IJ SOLN: 10.0000 mg | Freq: Four times a day (QID) | INTRAMUSCULAR | Status: DC | PRN

## 2023-11-25 MED ORDER — PROCHLORPERAZINE EDISYLATE 10 MG/2ML IJ SOLN
10.0000 mg | Freq: Four times a day (QID) | INTRAMUSCULAR | Status: DC | PRN
  Administered 2023-11-25: 10 mg via INTRAVENOUS
  Filled 2023-11-25: qty 2

## 2023-11-25 MED ORDER — POLYETHYLENE GLYCOL 3350 17 G PO PACK
17.0000 g | PACK | Freq: Two times a day (BID) | ORAL | Status: DC
  Administered 2023-11-25 (×2): 17 g via ORAL
  Filled 2023-11-25 (×3): qty 1

## 2023-11-25 MED ORDER — SENNOSIDES-DOCUSATE SODIUM 8.6-50 MG PO TABS
1.0000 | ORAL_TABLET | Freq: Two times a day (BID) | ORAL | Status: DC
  Administered 2023-11-25 (×2): 1 via ORAL
  Filled 2023-11-25 (×3): qty 1

## 2023-11-25 NOTE — Progress Notes (Signed)
 PROGRESS NOTE    Chelsea Hansen  WGN:[id number] DOB: [birth year] DOA: 11/24/2023 PCP: Marius Siemens, NP    Chief Complaint  Patient presents with   Emesis    Brief Narrative:  Patient is a pleasant unfortunate 53 year old female history of metastatic breast cancer to the bone on chemotherapy, asthma, GERD, hyperlipidemia, hypertension, PSVT, bradycardia, chronic pain syndrome, chronic constipation, anxiety and depression presented to the ED with persistent nausea and vomiting.  Patient noted to have had worsening symptoms over the past 10 days with no bowel movement over 10 days prior to admission.  Patient initially seen in the ED 1 day prior to admission noted to have moderate constipation and/discharged home on Zofran  patient due to no significant improvement with symptoms of multiple episodes of nausea and vomiting presented back to the ED after obtaining PET scan in the outpatient setting.  Patient admitted for persistent nausea and vomiting and constipation.   Assessment & Plan:   Principal Problem:   Intractable nausea and vomiting Active Problems:   Breast cancer metastasized to bone, right (HCC)   Chronic asthma   Constipation  #1 intractable nausea vomiting/abdominal pain/constipation -Patient presented with nausea and vomiting x 3 weeks that has progressively worsened over the past 10 days with associated constipation and no bowel movement over the past 10 days prior to admission. - CT abdomen and pelvis done on admission with moderate stool burden throughout the colon. - Symptoms of nausea and vomiting likely secondary to constipation. - Patient noted to have received MiraLAX  but vomited Dulcolax per RN on presentation. - Patient given smog enema x 1 this morning with good results per patient. - Continue current bowel regimen of MiraLAX  twice daily, Senokot-S twice daily. - Zofran  as needed nausea. - IV Compazine  when nausea not relieved by Zofran . - IV fluids,  supportive care.  2.  Hypokalemia -Likely secondary to GI losses. - Magnesium at 2.1. - Replete.  3.  GERD -IV PPI.  4.  Metastatic breast cancer to the bone/chronic pain syndrome -Patient underwent PET scan prior to admission. - Patient with nausea and vomiting and as such oral medications held. - Resume Verzenio  and anastrozole  when able to take p.o. intake. - Continue current pain regimen. - Continue bowel regimen. - Outpatient follow-up with oncology.  5.  Hypertension -Resume home regimen Norvasc  5 mg daily and propranolol  when able to take oral intake. - Placed on IV hydralazine as needed.  6.  PSVT -Propranolol  on hold until improvement with nausea and vomiting.  7.  Asthma -Albuterol  inhaler as needed.      DVT prophylaxis: Lovenox Code Status: Full Family Communication: Updated patient.  No family at bedside. Disposition: Likely home when clinically improved with resolution of constipation, nausea and vomiting.  Status is: Inpatient The patient will require care spanning > 2 midnights and should be moved to inpatient because: Severity of illness   Consultants:  Oncology informed via epic of admission.  Procedures:  CT angiogram chest 11/23/2023 CT abdomen and pelvis 11/23/2023  Antimicrobials:  Anti-infectives (From admission, onward)    None         Subjective: Patient sitting up in bed.  States feels much better after having bowel movement after enema this morning.  Denies any further nausea or vomiting.  No chest pain or shortness of breath.  Objective: Vitals:   11/25/23 0047 11/25/23 0603 11/25/23 1011 11/25/23 1347  BP: (!) 150/79 130/87 132/84 (!) 149/98  Pulse: (!) 56 66 62 (!) 52  Resp: 16 17 16 16   Temp: 98.8 F (37.1 C) 98.4 F (36.9 C) 98.3 F (36.8 C) 98.2 F (36.8 C)  TempSrc: Oral Oral  Oral  SpO2: 99% 99% 99% 100%  Weight:      Height:        Intake/Output Summary (Last 24 hours) at 11/25/2023 1919 Last data filed at  11/25/2023 1235 Gross per 24 hour  Intake 1631.21 ml  Output --  Net 1631.21 ml   Filed Weights   11/24/23 0807  Weight: 92.5 kg    Examination:  General exam: Appears calm and comfortable  Respiratory system: Clear to auscultation. Respiratory effort normal. Cardiovascular system: S1 & S2 heard, RRR. No JVD, murmurs, rubs, gallops or clicks. No pedal edema. Gastrointestinal system: Abdomen is nondistended, soft and nontender. No organomegaly or masses felt. Normal bowel sounds heard. Central nervous system: Alert and oriented. No focal neurological deficits. Extremities: Symmetric 5 x 5 power. Skin: No rashes, lesions or ulcers Psychiatry: Judgement and insight appear normal. Mood & affect appropriate.     Data Reviewed: I have personally reviewed following labs and imaging studies  CBC: Recent Labs  Lab 11/23/23 1241 11/24/23 0858 11/25/23 0319  WBC 4.5 4.3 5.7  NEUTROABS  --  2.7  --   HGB 10.7* 10.8* 10.2*  HCT 34.4* 35.7* 32.7*  MCV 77.5* 79.2* 76.9*  PLT 269 243 256    Basic Metabolic Panel: Recent Labs  Lab 11/23/23 1241 11/24/23 0858 11/25/23 0319  NA 138 138 138  K 3.3* 3.4* 3.4*  CL 99 99 106  CO2 28 30 24   GLUCOSE 98 105* 113*  BUN 18 15 10   CREATININE 0.84 0.85 0.80  CALCIUM  9.1 8.8* 8.9  MG  --   --  2.1  PHOS  --   --  2.5    GFR: Estimated Creatinine Clearance: 104.4 mL/min (by C-G formula based on SCr of 0.8 mg/dL).  Liver Function Tests: Recent Labs  Lab 11/23/23 1241 11/24/23 0858 11/25/23 0319  AST 32 31 30  ALT 31 32 32  ALKPHOS 144* 138* 143*  BILITOT 0.8 0.5 0.5  PROT 8.3* 7.7 7.2  ALBUMIN 4.2 3.9 3.7    CBG: Recent Labs  Lab 11/24/23 0639  GLUCAP 100*     No results found for this or any previous visit (from the past 240 hours).       Radiology Studies: No results found.      Scheduled Meds:  enoxaparin (LOVENOX) injection  40 mg Subcutaneous Q24H   pantoprazole  (PROTONIX ) IV  40 mg Intravenous  Daily   polyethylene glycol  17 g Oral BID   senna-docusate  1 tablet Oral BID   Continuous Infusions:  sodium chloride  Stopped (11/25/23 1907)     LOS: 0 days    Time spent: 35 minutes    Hilda Lovings, MD [facility]   To contact the attending provider between 7A-7P or the covering provider during after hours 7P-7A, please log into the web site [email or web address] and access using universal [place] password for that web site. If you do not have the password, please call the hospital operator.  11/25/2023, 7:19 PM

## 2023-11-25 NOTE — Plan of Care (Signed)
  Problem: Activity: Goal: Risk for activity intolerance will decrease Outcome: Progressing   Problem: Safety: Goal: Ability to remain free from injury will improve Outcome: Progressing   Problem: Pain Managment: Goal: General experience of comfort will improve and/or be controlled Outcome: Progressing   Problem: Coping: Goal: Level of anxiety will decrease Outcome: Progressing   Problem: Elimination: Goal: Will not experience complications related to bowel motility Outcome: Progressing Goal: Will not experience complications related to urinary retention Outcome: Progressing

## 2023-11-26 ENCOUNTER — Other Ambulatory Visit (HOSPITAL_COMMUNITY): Payer: Self-pay

## 2023-11-26 DIAGNOSIS — K5903 Drug induced constipation: Secondary | ICD-10-CM | POA: Diagnosis not present

## 2023-11-26 DIAGNOSIS — J4541 Moderate persistent asthma with (acute) exacerbation: Secondary | ICD-10-CM

## 2023-11-26 DIAGNOSIS — I10 Essential (primary) hypertension: Secondary | ICD-10-CM

## 2023-11-26 DIAGNOSIS — C50911 Malignant neoplasm of unspecified site of right female breast: Secondary | ICD-10-CM | POA: Diagnosis not present

## 2023-11-26 DIAGNOSIS — R112 Nausea with vomiting, unspecified: Secondary | ICD-10-CM | POA: Diagnosis not present

## 2023-11-26 LAB — BASIC METABOLIC PANEL WITH GFR
Anion gap: 6 (ref 5–15)
BUN: 12 mg/dL (ref 6–20)
CO2: 25 mmol/L (ref 22–32)
Calcium: 8.6 mg/dL — ABNORMAL LOW (ref 8.9–10.3)
Chloride: 104 mmol/L (ref 98–111)
Creatinine, Ser: 0.96 mg/dL (ref 0.44–1.00)
GFR, Estimated: >60 mL/min (ref >60)
Glucose, Bld: 102 mg/dL — ABNORMAL HIGH (ref 70–99)
Potassium: 3.3 mmol/L — ABNORMAL LOW (ref 3.5–5.1)
Sodium: 135 mmol/L (ref 135–145)

## 2023-11-26 LAB — CBC WITH DIFFERENTIAL/PLATELET
Abs Immature Granulocytes: 0.06 10*3/uL (ref 0.00–0.07)
Basophils Absolute: 0 10*3/uL (ref 0.0–0.1)
Basophils Relative: 1 %
Eosinophils Absolute: 0 10*3/uL (ref 0.0–0.5)
Eosinophils Relative: 1 %
HCT: 33.6 % — ABNORMAL LOW (ref 36.0–46.0)
Hemoglobin: 10.4 g/dL — ABNORMAL LOW (ref 12.0–15.0)
Immature Granulocytes: 1 %
Lymphocytes Relative: 24 %
Lymphs Abs: 1.3 10*3/uL (ref 0.7–4.0)
MCH: 24.1 pg — ABNORMAL LOW (ref 26.0–34.0)
MCHC: 31 g/dL (ref 30.0–36.0)
MCV: 78 fL — ABNORMAL LOW (ref 80.0–100.0)
Monocytes Absolute: 0.5 10*3/uL (ref 0.1–1.0)
Monocytes Relative: 10 %
Neutro Abs: 3.3 10*3/uL (ref 1.7–7.7)
Neutrophils Relative %: 63 %
Platelets: 238 10*3/uL (ref 150–400)
RBC: 4.31 MIL/uL (ref 3.87–5.11)
RDW: 15 % (ref 11.5–15.5)
WBC: 5.2 10*3/uL (ref 4.0–10.5)
nRBC: 0 % (ref 0.0–0.2)

## 2023-11-26 LAB — MAGNESIUM: Magnesium: 2.1 mg/dL (ref 1.7–2.4)

## 2023-11-26 MED ORDER — POTASSIUM CHLORIDE 10 MEQ/100ML IV SOLN
10.0000 meq | INTRAVENOUS | Status: AC
  Administered 2023-11-26 (×4): 10 meq via INTRAVENOUS
  Filled 2023-11-26 (×3): qty 100

## 2023-11-26 MED ORDER — PROCHLORPERAZINE MALEATE 10 MG PO TABS
10.0000 mg | ORAL_TABLET | Freq: Four times a day (QID) | ORAL | 1 refills | Status: AC | PRN
Start: 2023-11-26 — End: ?
  Filled 2023-11-26: qty 30, 8d supply, fill #0

## 2023-11-26 NOTE — Discharge Summary (Signed)
 Physician Discharge Summary  Chelsea Hansen ZOX:[id number] DOB: 06/25/1970 DOA: 11/24/2023  PCP: Marius Siemens, NP  Admit date: 11/24/2023 Discharge date: 11/26/2023  Time spent: 60 minutes  Recommendations for Outpatient Follow-up:  Follow-up with Dr. Lee Public, oncology as previously scheduled. Follow-up with Marius Siemens, NP in 3 weeks.  On follow-up patient will need basic metabolic profile done to follow-up on electrolytes and renal function.  Patient's blood pressure will need to be reassessed as well as blood pressure regimen as patient had stated was not taking antihypertensive medications prior to admission.  BP however remained stable during the hospitalization.  Constipation also needs to be followed up upon.   Discharge Diagnoses:  Principal Problem:   Intractable nausea and vomiting Active Problems:   Breast cancer metastasized to bone, right (HCC)   Chronic asthma   Hypokalemia   PSVT (paroxysmal supraventricular tachycardia) (HCC)   GERD (gastroesophageal reflux disease)   Hypertension   Constipation   Discharge Condition: Stable and improved  Diet recommendation: Regular  Filed Weights   11/24/23 0807  Weight: 92.5 kg    History of present illness:  HPI per Dr.Amponsah Chelsea Hansen is a 53 y.o. female with medical history significant for metastatic breast cancer to the bone on chemotherapy, asthma, GERD, HLD, HTN, PSVT, bradycardia, chronic pain syndrome, chronic constipation, anxiety and depression who presents to the ED for evaluation of persistent nausea and vomiting.  Patient reports that over the last 3 weeks, she has had intractable nausea and vomiting.  Her symptoms have worsened over the last 10 days and she has not had any bowel movement since then.  She was evaluated the ED yesterday and workup was only significant for moderate constipation so patient was discharged home with Zofran .  She reports that at 9, she had multiple nausea and vomiting  episodes has not been able to keep anything down since then.  She went for her PET scan and was advised to present to the ED for further evaluation.  She has associated abdominal pain but denies any fevers, chills, shortness of breath, hematuria or dysuria.   ED Course: Initial vitals shows patient afebrile and normotensive with SpO2 of 100% on room air.  Labs show CBG 100, K+ 3.4, Alkaline phosphate 138, stable anemia with Hgb of 10.8, normal lipase and white count.  EKG shows sinus bradycardia with HR 57.  CT A/P from 6/4 shows moderate amount of residual fecal nycturia throughout the colon without obstruction or constipation but no acute intra-abdominal findings.  CTA chest PE study positive from 6/4 negative for PE. Patient received IV LR 1 L bolus x 1, IV Phenergan  6.25 mg x 1, IV Protonix  40 mg x 1, rectal glycerin , IV Decadron  10 mg x 1 and MiraLAX  17 g x 1. [facility] is consulted for admission.  Hospital Course:  #1 intractable nausea vomiting/abdominal pain/constipation -Patient presented with nausea and vomiting x 3 weeks that has progressively worsened over the past 10 days with associated constipation and no bowel movement over the past 10 days prior to admission. - CT abdomen and pelvis done on admission with moderate stool burden throughout the colon. - Symptoms of nausea and vomiting likely secondary to constipation. - Patient noted to have received MiraLAX  but vomited Dulcolax per RN on presentation. - Patient given smog enema x 1 during the hospitalization with excellent results.   - Patient maintained on a bowel regimen of MiraLAX  twice daily as well as Senokot-S twice daily during the hospitalization.   -  Patient also initially placed on Zofran  as needed however patient did not have good results from Zofran  and essentially giving Compazine  which helped with her nausea.   - Patient placed on a diet which she was able to tolerate during the hospitalization with significant improvement with  nausea and vomiting.   - Patient be discharged in stable and improved condition on Compazine  as needed for nausea, bowel regimen of MiraLAX  twice daily as well as Senokot-S.   - Outpatient follow-up with primary oncologist and PCP.    2.  Hypokalemia -Likely secondary to GI losses. - Repleted.    3.  GERD - Patient maintained on IV PPI during the hospitalization.   4.  Metastatic breast cancer to the bone/chronic pain syndrome -Patient underwent PET scan prior to admission. - Patient with nausea and vomiting and as such oral medications held during the hospitalization. - Verzenio  and anastrozole  were held during the hospitalization and will be resumed on discharge.   - Patient maintained on home pain regimen during the hospitalization as well as bowel regimen.   - Outpatient follow-up with primary oncologist as previously scheduled next week.    5.  Hypertension - Per med rec patient noted to be on Norvasc  and propranolol  prior to admission with inability to take them due to presentation with nausea and vomiting.   - Patient did state was not taking any antihypertensive medications prior to admission.   - Blood pressure remained well-controlled during the hospitalization.   - Patient will be discharged home on home regimen of propranolol  with close outpatient follow-up with PCP.     6.  PSVT -Propranolol  held during the hospitalization will be resumed on discharge.    7.  Asthma -Albuterol  inhaler as needed.    Procedures: CT angiogram chest 11/23/2023 CT abdomen and pelvis 11/23/2023  Consultations: None  Discharge Exam: Vitals:   11/26/23 0637 11/26/23 1318  BP: 126/78 139/86  Pulse: 61 (!) 59  Resp: 17 17  Temp: 98.8 F (37.1 C) 97.7 F (36.5 C)  SpO2: 94% 94%    General: NAD Cardiovascular: RRR no murmurs rubs or gallops.  No JVD.  No pitting lower extremity edema. Respiratory: Clear to auscultation bilaterally.  No wheezes, no crackles, no rhonchi.  Fair air  movement.  Speaking in full sentences.  Discharge Instructions   Discharge Instructions     Diet general   Complete by: As directed    Increase activity slowly   Complete by: As directed       Allergies as of 11/26/2023   No Known Allergies      Medication List     STOP taking these medications    amLODipine  5 MG tablet Commonly known as: NORVASC    pantoprazole  40 MG tablet Commonly known as: Protonix        TAKE these medications    anastrozole  1 MG tablet Commonly known as: ARIMIDEX  Take 1 tablet (1 mg total) by mouth daily.   gabapentin  300 MG capsule Commonly known as: NEURONTIN  Take 1 capsule (300 mg total) by mouth 3 (three) times daily.   montelukast  10 MG tablet Commonly known as: SINGULAIR  Take 1 tablet (10 mg total) by mouth at bedtime. What changed:  when to take this reasons to take this   ondansetron  4 MG disintegrating tablet Commonly known as: ZOFRAN -ODT Dissolve 1 tablet (4 mg total) by mouth every 8 (eight) hours as needed for nausea or vomiting. What changed: reasons to take this   oxyCODONE -acetaminophen  7.5-325 MG tablet  Commonly known as: Percocet Take 1-2 tablets by mouth every 6 (six) hours as needed for severe pain (pain score 7-10). What changed: when to take this   OxyCONTIN  10 MG 12 hr tablet Generic drug: oxyCODONE  Take 1 tablet (10 mg total) by mouth every 12 (twelve) hours.   Oyster Shell Calcium  w/D 500-5 MG-MCG Tabs Take 1 tablet by mouth daily with supper.   polyethylene glycol 17 g packet Commonly known as: MIRALAX  / GLYCOLAX  Take 17 g by mouth 2 (two) times daily.   prochlorperazine  10 MG tablet Commonly known as: COMPAZINE  Take 1 tablet (10 mg total) by mouth every 6 (six) hours as needed for nausea or vomiting.   propranolol  10 MG tablet Commonly known as: INDERAL  Take 1 tablet (10 mg total) by mouth 2 (two) times daily. Please call [phone] to schedule an appointment for future refills. Thank you. 3rd  attempt.   senna 8.6 MG Tabs tablet Commonly known as: SENOKOT Take 2 tablets (17.2 mg total) by mouth at bedtime.   Ventolin  HFA 108 (90 Base) MCG/ACT inhaler Generic drug: albuterol  Inhale 2 puffs by mouth into the lungs every 6 hours as needed for wheezing or shortness of breath.   Verzenio  50 MG tablet Generic drug: abemaciclib  Take 1 tablet (50 mg total) by mouth 2 (two) times daily.       No Known Allergies  Follow-up Information     Cameron Cea, MD Follow up.   Specialty: Hematology and Oncology Why: Follow-up next week as scheduled. Contact information: [address] [place] [address] [phone]         Marius Siemens, NP. Schedule an appointment as soon as possible for a visit in 3 week(s).   Specialty: Internal Medicine Contact information: [address] [place] [address] [phone]                  The results of significant diagnostics from this hospitalization (including imaging, microbiology, ancillary and laboratory) are listed below for reference.    Significant Diagnostic Studies: CT Angio Chest PE W and/or Wo Contrast Result Date: 11/23/2023 CLINICAL DATA:  Chest pain, shortness of breath EXAM: CT ANGIOGRAPHY CHEST WITH CONTRAST TECHNIQUE: Multidetector CT imaging of the chest was performed using the standard protocol during bolus administration of intravenous contrast. Multiplanar CT image reconstructions and MIPs were obtained to evaluate the vascular anatomy. RADIATION DOSE REDUCTION: This exam was performed according to the departmental dose-optimization program which includes automated exposure control, adjustment of the mA and/or kV according to patient size and/or use of iterative reconstruction technique. CONTRAST:  OMNIPAQUE  IOHEXOL  350 MG/ML SOLN COMPARISON:  None Available. FINDINGS: Cardiovascular: Satisfactory opacification of the pulmonary arteries to the segmental level. No evidence of  pulmonary embolism. Normal heart size. No pericardial effusion. Mediastinum/Nodes: No enlarged mediastinal, hilar, or axillary lymph nodes. Thyroid  gland, trachea, and esophagus demonstrate no significant findings. Lungs/Pleura: There is an ill-defined nodular pattern throughout both lungs with small small nodular densities that measured between a few mm up to 4 mm bilaterally. These findings in combination with the previously described findings of metastatic bone disease raises the possibility of metastatic lung disease. There is no other infiltrates or consolidations or pleural effusions. Upper Abdomen: No acute abnormality. Musculoskeletal: Diffuse sclerotic metastatic bone disease of the thoracic spine, sternum and ribs. Review of the MIP images confirms the above findings. IMPRESSION: *No evidence of pulmonary embolism. *Diffuse sclerotic metastatic bone disease. *Ill-defined nodular pattern throughout both lungs with small nodular densities  that measured between a few mm up to 4 mm bilaterally. These findings in combination with the previously described findings of metastatic bone disease raises the possibility of metastatic lung disease. Electronically Signed   By: Fredrich Jefferson M.D.   On: 11/23/2023 15:20   CT ABDOMEN PELVIS W CONTRAST Result Date: 11/23/2023 CLINICAL DATA:  Abdominal pain EXAM: CT ABDOMEN AND PELVIS WITH CONTRAST TECHNIQUE: Multidetector CT imaging of the abdomen and pelvis was performed using the standard protocol following bolus administration of intravenous contrast. RADIATION DOSE REDUCTION: This exam was performed according to the departmental dose-optimization program which includes automated exposure control, adjustment of the mA and/or kV according to patient size and/or use of iterative reconstruction technique. CONTRAST:  OMNIPAQUE  IOHEXOL  350 MG/ML SOLN COMPARISON:  CT abdomen and pelvis August 23, 2022 FINDINGS: Lower chest: No infiltrates or consolidations, no pleural  effusions Hepatobiliary: Liver normal size no masses no biliary dilatation. Gallbladder unremarkable. No gallstones. Pancreas: Pancreas normal size. No masses calcifications or inflammatory changes. Spleen: Spleen normal size.  No masses. Adrenals/Urinary Tract: Adrenal glands are normal size. Follow-up recommended. Kidneys are normal. No masses calcifications or hydronephrosis Stomach/Bowel: No small or large bowel obstruction or inflammatory changes. Diffuse pan diverticulosis unchanged since prior. Moderate amount of residual fecal material throughout the colon without obstruction or constipation. Vascular/Lymphatic: No significant vascular findings are present. No enlarged abdominal or pelvic lymph nodes. Reproductive: .  No masses. Bladder unremarkable. Other: Anterior abdominal wall unremarkable without evidence of umbilical or inguinal hernias Musculoskeletal: Again noted multiple sclerotic lesions involving the thoracolumbar spine with fracture deformity of L3 correlates with metastatic bone disease. IMPRESSION: *No acute findings in the abdomen or pelvis. *Diffuse pan diverticulosis unchanged since prior. *Moderate amount of residual fecal material throughout the colon without obstruction or constipation. *Again noted multiple sclerotic lesions involving the thoracolumbar spine with fracture deformity of L3 correlates with metastatic bone disease. Electronically Signed   By: Fredrich Jefferson M.D.   On: 11/23/2023 15:17    Microbiology: No results found for this or any previous visit (from the past 240 hours).   Labs: Basic Metabolic Panel: Recent Labs  Lab 11/23/23 1241 11/24/23 0858 11/25/23 0319 11/26/23 0340  NA 138 138 138 135  K 3.3* 3.4* 3.4* 3.3*  CL 99 99 106 104  CO2 28 30 24 25   GLUCOSE 98 105* 113* 102*  BUN 18 15 10 12   CREATININE 0.84 0.85 0.80 0.96  CALCIUM  9.1 8.8* 8.9 8.6*  MG  --   --  2.1 2.1  PHOS  --   --  2.5  --    Liver Function Tests: Recent Labs  Lab  11/23/23 1241 11/24/23 0858 11/25/23 0319  AST 32 31 30  ALT 31 32 32  ALKPHOS 144* 138* 143*  BILITOT 0.8 0.5 0.5  PROT 8.3* 7.7 7.2  ALBUMIN 4.2 3.9 3.7   Recent Labs  Lab 11/23/23 1241 11/24/23 0858  LIPASE 39 44   No results for input(s): "AMMONIA" in the last 168 hours. CBC: Recent Labs  Lab 11/23/23 1241 11/24/23 0858 11/25/23 0319 11/26/23 0340  WBC 4.5 4.3 5.7 5.2  NEUTROABS  --  2.7  --  3.3  HGB 10.7* 10.8* 10.2* 10.4*  HCT 34.4* 35.7* 32.7* 33.6*  MCV 77.5* 79.2* 76.9* 78.0*  PLT 269 243 256 238   Cardiac Enzymes: No results for input(s): "CKTOTAL", "CKMB", "CKMBINDEX", "TROPONINI" in the last 168 hours. BNP: BNP (last 3 results) No results for input(s): "BNP" in the  last 8760 hours.  ProBNP (last 3 results) No results for input(s): "PROBNP" in the last 8760 hours.  CBG: Recent Labs  Lab 11/24/23 0639  GLUCAP 100*       Signed:  Hilda Lovings MD.  [facility] 11/26/2023, 1:47 PM

## 2023-11-26 NOTE — Progress Notes (Signed)
 TOC meds in a secure bag delivered to patient in room  by this RN

## 2023-11-26 NOTE — Progress Notes (Signed)
   11/26/23 1459  TOC Brief Assessment  Insurance and Status Reviewed  Patient has primary care physician Yes  Home environment has been reviewed apartment  Prior level of function: independent  Prior/Current Home Services No current home services  Social Drivers of Health Review SDOH reviewed no interventions necessary  Readmission risk has been reviewed Yes  Transition of care needs no transition of care needs at this time    [place], MSW, LCSW 11/26/2023 2:59 PM

## 2023-11-26 NOTE — Plan of Care (Signed)
   Problem: Coping: Goal: Level of anxiety will decrease Outcome: Progressing   Problem: Pain Managment: Goal: General experience of comfort will improve and/or be controlled Outcome: Progressing   Problem: Safety: Goal: Ability to remain free from injury will improve Outcome: Progressing

## 2023-11-28 ENCOUNTER — Other Ambulatory Visit: Payer: Self-pay | Admitting: Nurse Practitioner

## 2023-11-28 ENCOUNTER — Telehealth: Payer: Self-pay | Admitting: *Deleted

## 2023-11-28 DIAGNOSIS — C50411 Malignant neoplasm of upper-outer quadrant of right female breast: Secondary | ICD-10-CM

## 2023-11-28 DIAGNOSIS — G893 Neoplasm related pain (acute) (chronic): Secondary | ICD-10-CM

## 2023-11-28 DIAGNOSIS — Z515 Encounter for palliative care: Secondary | ICD-10-CM

## 2023-11-28 DIAGNOSIS — C7951 Secondary malignant neoplasm of bone: Secondary | ICD-10-CM

## 2023-11-28 NOTE — Transitions of Care (Post Inpatient/ED Visit) (Signed)
 11/28/2023  Name: Chelsea Hansen MRN: [id number] DOB: 16-Jun-1971  Today's TOC FU Call Status: Today's TOC FU Call Status:: Successful TOC FU Call Completed TOC FU Call Complete Date: 11/28/23 Patient's Name and Date of Birth confirmed.  Transition Care Management Follow-up Telephone Call Date of Discharge: 11/26/23 Discharge Facility: [facility]) Type of Discharge: Inpatient Admission Primary Inpatient Discharge Diagnosis:: Intractable nausea and vomiting How have you been since you were released from the hospital?: Same Any questions or concerns?: Yes Patient Questions/Concerns:: Patient has questions regarding what she can take for constipation. Patient Questions/Concerns Addressed: Other: (Provided education, advised to increase water intake, eat more fruits and vegetables and OTC stool softener)  Items Reviewed: Did you receive and understand the discharge instructions provided?: Yes Medications obtained,verified, and reconciled?: Yes (Medications Reviewed) Any new allergies since your discharge?: No Dietary orders reviewed?: Yes Type of Diet Ordered:: general Do you have support at home?: Yes People in Home [RPT]: child(ren), adult Name of Support/Comfort Primary Source: [name]/Son  Medications Reviewed Today: Medications Reviewed Today     Reviewed by Aura Leeds, RN (Registered Nurse) on 11/28/23 at [phone]  Med List Status: <None>   Medication Order Taking? Sig Documenting Provider Last Dose Status Informant  abemaciclib  (VERZENIO ) 50 MG tablet [id number] Yes Take 1 tablet (50 mg total) by mouth 2 (two) times daily. [name], [name], MD Taking Active Self  albuterol  (VENTOLIN  HFA) 108 (90 Base) MCG/ACT inhaler [id number] Yes Inhale 2 puffs by mouth into the lungs every 6 hours as needed for wheezing or shortness of breath. Marius Siemens, NP Taking Active Self  anastrozole  (ARIMIDEX ) 1 MG tablet [id number] Yes Take 1 tablet (1 mg total) by mouth daily. Percival Brace, NP Taking Active Self  calcium -vitamin D (OSCAL WITH D) 500-5 MG-MCG tablet [id number] No Take 1 tablet by mouth daily with supper.  Patient not taking: Reported on 11/24/2023   [name], [name], MD Not Taking Active Self  gabapentin  (NEURONTIN ) 300 MG capsule [id number] Yes Take 1 capsule (300 mg total) by mouth 3 (three) times daily. [name], [name], MD Taking Active Self  montelukast  (SINGULAIR ) 10 MG tablet [id number] Yes Take 1 tablet (10 mg total) by mouth at bedtime.  Patient taking differently: Take 10 mg by mouth at bedtime as needed (for asthma flares or allergic symptoms).   Marius Siemens, NP Taking Active Self  ondansetron  (ZOFRAN -ODT) 4 MG disintegrating tablet [id number] Yes Dissolve 1 tablet (4 mg total) by mouth every 8 (eight) hours as needed for nausea or vomiting.  Patient taking differently: Take 4 mg by mouth every 8 (eight) hours as needed for nausea or vomiting (dissolve orally).   Kendrick Pax, PA-C Taking Active Self  oxyCODONE  (OXYCONTIN ) 10 mg 12 hr tablet [id number] Yes Take 1 tablet (10 mg total) by mouth every 12 (twelve) hours. [name], Giles Labrum, NP Taking Active Self  oxyCODONE -acetaminophen  (PERCOCET) 7.5-325 MG tablet [id number] Yes Take 1-2 tablets by mouth every 6 (six) hours as needed for severe pain (pain score 7-10).  Patient taking differently: Take 1-2 tablets by mouth every 6 (six) hours.   [name], Giles Labrum, NP Taking Active Self  polyethylene glycol (MIRALAX  / GLYCOLAX ) 17 g packet [id number] No Take 17 g by mouth 2 (two) times daily.  Patient not taking: Reported on 11/24/2023   Loma Rising, MD Not Taking Active Self  prochlorperazine  (COMPAZINE ) 10 MG tablet [id number] No Take 1 tablet (10 mg total) by mouth every 6 (six) hours as needed  for nausea or vomiting.  Patient not taking: Reported on 11/28/2023   Armenta Landau, MD Not Taking Active            Med Note (Caroline Matters A   Mon Nov 28, 2023  9:53 AM) Needs to  pick up from pharmacy  propranolol  (INDERAL ) 10 MG tablet [id number] No Take 1 tablet (10 mg total) by mouth 2 (two) times daily. Please call [phone] to schedule an appointment for future refills. Thank you. 3rd attempt.  Patient not taking: Reported on 11/24/2023   [name], [name], DO Not Taking Active Self  senna (SENOKOT) 8.6 MG TABS tablet [id number] No Take 2 tablets (17.2 mg total) by mouth at bedtime.  Patient not taking: Reported on 11/24/2023   Loma Rising, MD Not Taking Active Self  Med List Note Althea Atkinson, RPH-CPP 07/07/22 8657): Abemaciclib  through [facility];   UDS 02/11/22  MR 05/22/22 Medication Agreement signed. LP  Hydrocodone  approved through 5 13 23             Home Care and Equipment/Supplies: Were Home Health Services Ordered?: No Any new equipment or medical supplies ordered?: No  Functional Questionnaire: Do you need assistance with bathing/showering or dressing?: No Do you need assistance with meal preparation?: No Do you need assistance with eating?: No Do you have difficulty maintaining continence: No Do you need assistance with getting out of bed/getting out of a chair/moving?: No Do you have difficulty managing or taking your medications?: No  Follow up appointments reviewed: PCP Follow-up appointment confirmed?: Yes Date of PCP follow-up appointment?: 11/30/23 Follow-up Provider: Jacqualine Mater, NP Specialist Hospital Follow-up appointment confirmed?: Yes Date of Specialist follow-up appointment?: 12/01/23 Follow-Up Specialty Provider:: Oncology Do you need transportation to your follow-up appointment?: No Do you understand care options if your condition(s) worsen?: Yes-patient verbalized understanding  SDOH Interventions Today    Flowsheet Row Most Recent Value  SDOH Interventions   Food Insecurity Interventions Intervention Not Indicated  Housing Interventions Intervention Not Indicated  Transportation Interventions Intervention Not Indicated   Utilities Interventions Intervention Not Indicated      Patient has a Case Manager through [facility] and will contact CM with any needs.  Arna Better RN, BSN [place]  [facility] RN Care Manager [phone]

## 2023-11-29 ENCOUNTER — Telehealth (INDEPENDENT_AMBULATORY_CARE_PROVIDER_SITE_OTHER): Payer: Self-pay | Admitting: Primary Care

## 2023-11-29 ENCOUNTER — Other Ambulatory Visit (HOSPITAL_COMMUNITY): Payer: Self-pay

## 2023-11-29 MED ORDER — OXYCODONE HCL ER 10 MG PO T12A
10.0000 mg | EXTENDED_RELEASE_TABLET | Freq: Two times a day (BID) | ORAL | 0 refills | Status: DC
  Filled 2023-11-29: qty 60, 30d supply, fill #0

## 2023-11-29 NOTE — Telephone Encounter (Signed)
 Called pt to confirm appt. Pt did not answer and LVM

## 2023-11-29 NOTE — ED Provider Notes (Signed)
 I supervised the resident Dr. Ival Marines and personally evaluated this patient during this ED encounter.  Due to an issue with epic I am unable to directly cosign her note from the same date.  I agree with with the and assessment and plan as outlined in Dr. Viviano Ground note   Sallyanne Creamer, DO 11/29/23 1514

## 2023-11-30 ENCOUNTER — Encounter (INDEPENDENT_AMBULATORY_CARE_PROVIDER_SITE_OTHER): Payer: Self-pay | Admitting: Primary Care

## 2023-11-30 ENCOUNTER — Encounter (INDEPENDENT_AMBULATORY_CARE_PROVIDER_SITE_OTHER): Payer: Self-pay

## 2023-11-30 ENCOUNTER — Other Ambulatory Visit (HOSPITAL_COMMUNITY): Payer: Self-pay

## 2023-11-30 ENCOUNTER — Ambulatory Visit (INDEPENDENT_AMBULATORY_CARE_PROVIDER_SITE_OTHER): Payer: MEDICAID | Admitting: Primary Care

## 2023-11-30 ENCOUNTER — Encounter: Payer: Self-pay | Admitting: Hematology and Oncology

## 2023-11-30 ENCOUNTER — Other Ambulatory Visit: Payer: Self-pay

## 2023-11-30 VITALS — BP 100/68 | HR 64 | Resp 16 | Ht 71.0 in | Wt 188.8 lb

## 2023-11-30 DIAGNOSIS — C50411 Malignant neoplasm of upper-outer quadrant of right female breast: Secondary | ICD-10-CM

## 2023-11-30 DIAGNOSIS — Z170 Estrogen receptor positive status [ER+]: Secondary | ICD-10-CM

## 2023-11-30 DIAGNOSIS — I10 Essential (primary) hypertension: Secondary | ICD-10-CM | POA: Diagnosis not present

## 2023-11-30 DIAGNOSIS — R112 Nausea with vomiting, unspecified: Secondary | ICD-10-CM

## 2023-11-30 DIAGNOSIS — Z09 Encounter for follow-up examination after completed treatment for conditions other than malignant neoplasm: Secondary | ICD-10-CM

## 2023-11-30 DIAGNOSIS — K5903 Drug induced constipation: Secondary | ICD-10-CM

## 2023-11-30 MED ORDER — SENNA-DOCUSATE SODIUM 8.6-50 MG PO TABS
1.0000 | ORAL_TABLET | Freq: Every day | ORAL | 1 refills | Status: DC
Start: 2023-11-30 — End: 2024-01-31
  Filled 2023-11-30: qty 120, 120d supply, fill #0

## 2023-11-30 MED ORDER — PROCHLORPERAZINE MALEATE 10 MG PO TABS
10.0000 mg | ORAL_TABLET | Freq: Four times a day (QID) | ORAL | 0 refills | Status: DC | PRN
  Filled 2023-11-30: qty 120, 30d supply, fill #0

## 2023-11-30 MED ORDER — POLYETHYLENE GLYCOL 3350 17 GM/SCOOP PO POWD
17.0000 g | Freq: Two times a day (BID) | ORAL | 1 refills | Status: DC | PRN
  Filled 2023-11-30: qty 952, 28d supply, fill #0

## 2023-11-30 NOTE — Progress Notes (Signed)
 Subjective:    Chelsea Hansen is a 53 y.o. female presents for hospital follow up.. Admit date to the hospital was 11/24/23, patient was discharged from the hospital on 11/26/23, patient was admitted for: Intractable nausea and vomiting. Active Problems:Breast cancer metastasized to bone, right , Chronic asthma Hypokalemia ,Paroxysmal supraventricular tachycardia, gastroesophageal reflux disease, Hypertension and Constipation.  Patient presents today with constipation no bowel movement since hospital discharge and continues to have nausea and vomiting significant weight loss.  Spoke with hospitalist and he advised MiraLAX  twice a day and Senokot as well that is also noted on her discharge papers.  However not realizing it was a over-the-counter medication she did not pick it up.  As far as the nausea and vomiting she will be on Compazine  4 times a day as recommended.  Also, made patient aware that narcotics side effect is constipation.  Recently, she has not been able to keep her pain medicine down which is increasing back pain.  Past Medical History:  Diagnosis Date   Allergy    seasonal allergies   Anxiety    on meds   Asthma    uses inhaler   Breast cancer (HCC) 2012   RIGHT lumpectomy   Cancer (HCC) 2022   RIGHT breast lump-dx 2022   Depression    on meds   DVT (deep venous thrombosis) (HCC) 2010   after hysterectomy   Family history of uterine cancer    GERD (gastroesophageal reflux disease)    with certain foods/OTC PRN meds   Headache(784.0)    History of radiation therapy    Bilateral breast- 09/03/21-11/02/21- Dr. Retta Caster   Hyperlipidemia    on meds   Hypertension    on meds   SVT (supraventricular tachycardia) (HCC)      No Known Allergies  No current facility-administered medications on file prior to visit.   Current Outpatient Medications on File Prior to Visit  Medication Sig Dispense Refill   albuterol  (VENTOLIN  HFA) 108 (90 Base) MCG/ACT inhaler Inhale  2 puffs by mouth into the lungs every 6 hours as needed for wheezing or shortness of breath. 18 g 2   anastrozole  (ARIMIDEX ) 1 MG tablet Take 1 tablet (1 mg total) by mouth daily. 90 tablet 3   calcium -vitamin D (OSCAL WITH D) 500-5 MG-MCG tablet Take 1 tablet by mouth daily with supper. 90 tablet 2   gabapentin  (NEURONTIN ) 300 MG capsule Take 1 capsule (300 mg total) by mouth 3 (three) times daily. 90 capsule 6   montelukast  (SINGULAIR ) 10 MG tablet Take 1 tablet (10 mg total) by mouth at bedtime. (Patient taking differently: Take 10 mg by mouth at bedtime as needed (for asthma flares or allergic symptoms).) 90 tablet 3   ondansetron  (ZOFRAN -ODT) 4 MG disintegrating tablet Dissolve 1 tablet (4 mg total) by mouth every 8 (eight) hours as needed for nausea or vomiting. (Patient taking differently: Take 4 mg by mouth every 8 (eight) hours as needed for nausea or vomiting (dissolve orally).) 20 tablet 0   oxyCODONE  (OXYCONTIN ) 10 mg 12 hr tablet Take 1 tablet (10 mg total) by mouth every 12 (twelve) hours. 60 tablet 0   polyethylene glycol (MIRALAX  / GLYCOLAX ) 17 g packet Take 17 g by mouth 2 (two) times daily. 60 each 3   prochlorperazine  (COMPAZINE ) 10 MG tablet Take 1 tablet (10 mg total) by mouth every 6 (six) hours as needed for nausea or vomiting. 30 tablet 1   propranolol  (INDERAL ) 10 MG  tablet Take 1 tablet (10 mg total) by mouth 2 (two) times daily. Please call [phone] to schedule an appointment for future refills. Thank you. 3rd attempt. 30 tablet 0   senna (SENOKOT) 8.6 MG TABS tablet Take 2 tablets (17.2 mg total) by mouth at bedtime. 60 tablet 3    Review of System: ROS Comprehensive ROS Pertinent positive and negative noted in HPI   Objective:  BP 100/68   Pulse 64   Resp 16   Ht 5' 11 (1.803 m)   Wt 188 lb 12.8 oz (85.6 kg)   SpO2 100%   BMI 26.33 kg/m   Filed Weights   11/30/23 0906  Weight: 188 lb 12.8 oz (85.6 kg)    Physical Exam Vitals reviewed.   Constitutional:      Appearance: Normal appearance.  HENT:     Head: Normocephalic.     Right Ear: Tympanic membrane, ear canal and external ear normal.     Left Ear: Tympanic membrane, ear canal and external ear normal.     Nose: Nose normal.     Mouth/Throat:     Mouth: Mucous membranes are moist.   Eyes:     Extraocular Movements: Extraocular movements intact.     Pupils: Pupils are equal, round, and reactive to light.    Cardiovascular:     Rate and Rhythm: Normal rate.  Pulmonary:     Effort: Pulmonary effort is normal.     Breath sounds: Normal breath sounds.  Abdominal:     General: Bowel sounds are normal.     Palpations: Abdomen is soft.   Musculoskeletal:        General: Normal range of motion.     Cervical back: Normal range of motion.   Skin:    General: Skin is warm and dry.   Neurological:     Mental Status: She is alert and oriented to person, place, and time.   Psychiatric:        Mood and Affect: Mood normal.        Behavior: Behavior normal.        Thought Content: Thought content normal.      Assessment:  Sura was seen today for hospitalization follow-up.  Diagnoses and all orders for this visit:  Hospital discharge follow-up See HPI  Drug-induced constipation -     prochlorperazine  (COMPAZINE ) 10 MG tablet; Take 1 tablet (10 mg total) by mouth every 6 (six) hours as needed for nausea or vomiting. -     polyethylene glycol powder (GLYCOLAX /MIRALAX ) 17 GM/SCOOP powder; Mix 17 g in 4 oz of liquid and take by mouth 2 (two) times daily as needed. -     sennosides-docusate sodium  (SENOKOT-S) 8.6-50 MG tablet; Take 1 tablet by mouth daily.  Essential hypertension, benign Hypotensive -  Intractable nausea and vomiting -     CMP14+EGFR  Malignant neoplasm of upper-outer quadrant of right breast in female, estrogen receptor positive (HCC)   This note has been created with Education officer, environmental. Any  transcriptional errors are unintentional.   Return in about 3 months (around 03/01/2024).  Marius Siemens, NP 12/05/2023, 11:44 PM

## 2023-12-01 ENCOUNTER — Other Ambulatory Visit (HOSPITAL_COMMUNITY): Payer: Self-pay

## 2023-12-01 ENCOUNTER — Inpatient Hospital Stay: Payer: MEDICAID

## 2023-12-01 ENCOUNTER — Other Ambulatory Visit: Payer: Self-pay | Admitting: Hematology and Oncology

## 2023-12-01 ENCOUNTER — Inpatient Hospital Stay (HOSPITAL_BASED_OUTPATIENT_CLINIC_OR_DEPARTMENT_OTHER): Payer: MEDICAID | Admitting: Adult Health

## 2023-12-01 ENCOUNTER — Inpatient Hospital Stay: Payer: MEDICAID | Attending: Adult Health

## 2023-12-01 ENCOUNTER — Inpatient Hospital Stay: Payer: MEDICAID | Admitting: Nurse Practitioner

## 2023-12-01 ENCOUNTER — Encounter: Payer: Self-pay | Admitting: Adult Health

## 2023-12-01 ENCOUNTER — Other Ambulatory Visit: Payer: Self-pay

## 2023-12-01 VITALS — BP 112/65 | HR 65 | Temp 97.7°F | Resp 18 | Ht 71.0 in | Wt 188.6 lb

## 2023-12-01 DIAGNOSIS — R634 Abnormal weight loss: Secondary | ICD-10-CM | POA: Diagnosis not present

## 2023-12-01 DIAGNOSIS — G893 Neoplasm related pain (acute) (chronic): Secondary | ICD-10-CM

## 2023-12-01 DIAGNOSIS — Z79811 Long term (current) use of aromatase inhibitors: Secondary | ICD-10-CM | POA: Insufficient documentation

## 2023-12-01 DIAGNOSIS — C50412 Malignant neoplasm of upper-outer quadrant of left female breast: Secondary | ICD-10-CM | POA: Insufficient documentation

## 2023-12-01 DIAGNOSIS — C50411 Malignant neoplasm of upper-outer quadrant of right female breast: Secondary | ICD-10-CM | POA: Diagnosis present

## 2023-12-01 DIAGNOSIS — R112 Nausea with vomiting, unspecified: Secondary | ICD-10-CM | POA: Diagnosis not present

## 2023-12-01 DIAGNOSIS — Z170 Estrogen receptor positive status [ER+]: Secondary | ICD-10-CM

## 2023-12-01 DIAGNOSIS — C7951 Secondary malignant neoplasm of bone: Secondary | ICD-10-CM

## 2023-12-01 DIAGNOSIS — K5900 Constipation, unspecified: Secondary | ICD-10-CM | POA: Diagnosis not present

## 2023-12-01 DIAGNOSIS — E876 Hypokalemia: Secondary | ICD-10-CM | POA: Diagnosis not present

## 2023-12-01 DIAGNOSIS — Z95828 Presence of other vascular implants and grafts: Secondary | ICD-10-CM

## 2023-12-01 DIAGNOSIS — Z515 Encounter for palliative care: Secondary | ICD-10-CM

## 2023-12-01 LAB — CMP (CANCER CENTER ONLY)
ALT: 53 U/L — ABNORMAL HIGH (ref 0–44)
AST: 43 U/L — ABNORMAL HIGH (ref 15–41)
Albumin: 4.5 g/dL (ref 3.5–5.0)
Alkaline Phosphatase: 191 U/L — ABNORMAL HIGH (ref 38–126)
Anion gap: 14 (ref 5–15)
BUN: 18 mg/dL (ref 6–20)
CO2: 30 mmol/L (ref 22–32)
Calcium: 9.1 mg/dL (ref 8.9–10.3)
Chloride: 96 mmol/L — ABNORMAL LOW (ref 98–111)
Creatinine: 1.07 mg/dL — ABNORMAL HIGH (ref 0.44–1.00)
GFR, Estimated: >60 mL/min (ref >60)
Glucose, Bld: 106 mg/dL — ABNORMAL HIGH (ref 70–99)
Potassium: 3.3 mmol/L — ABNORMAL LOW (ref 3.5–5.1)
Sodium: 140 mmol/L (ref 135–145)
Total Bilirubin: 0.6 mg/dL (ref 0.0–1.2)
Total Protein: 8.4 g/dL — ABNORMAL HIGH (ref 6.5–8.1)

## 2023-12-01 LAB — CMP14+EGFR
ALT: 45 IU/L — ABNORMAL HIGH (ref 0–32)
AST: 27 IU/L (ref 0–40)
Albumin: 4.5 g/dL (ref 3.8–4.9)
Alkaline Phosphatase: 223 IU/L — ABNORMAL HIGH (ref 44–121)
BUN/Creatinine Ratio: 20 (ref 9–23)
BUN: 19 mg/dL (ref 6–24)
Bilirubin Total: 0.3 mg/dL (ref 0.0–1.2)
CO2: 26 mmol/L (ref 20–29)
Calcium: 9 mg/dL (ref 8.7–10.2)
Chloride: 97 mmol/L (ref 96–106)
Creatinine, Ser: 0.94 mg/dL (ref 0.57–1.00)
Globulin, Total: 2.8 g/dL (ref 1.5–4.5)
Glucose: 88 mg/dL (ref 70–99)
Potassium: 3.9 mmol/L (ref 3.5–5.2)
Sodium: 144 mmol/L (ref 134–144)
Total Protein: 7.3 g/dL (ref 6.0–8.5)
eGFR: 73 mL/min/{1.73_m2} (ref >59)

## 2023-12-01 LAB — CBC WITH DIFFERENTIAL (CANCER CENTER ONLY)
Abs Immature Granulocytes: 0.05 10*3/uL (ref 0.00–0.07)
Basophils Absolute: 0 10*3/uL (ref 0.0–0.1)
Basophils Relative: 1 %
Eosinophils Absolute: 0.1 10*3/uL (ref 0.0–0.5)
Eosinophils Relative: 2 %
HCT: 36.6 % (ref 36.0–46.0)
Hemoglobin: 11.6 g/dL — ABNORMAL LOW (ref 12.0–15.0)
Immature Granulocytes: 1 %
Lymphocytes Relative: 26 %
Lymphs Abs: 1.1 10*3/uL (ref 0.7–4.0)
MCH: 23.5 pg — ABNORMAL LOW (ref 26.0–34.0)
MCHC: 31.7 g/dL (ref 30.0–36.0)
MCV: 74.2 fL — ABNORMAL LOW (ref 80.0–100.0)
Monocytes Absolute: 0.6 10*3/uL (ref 0.1–1.0)
Monocytes Relative: 14 %
Neutro Abs: 2.3 10*3/uL (ref 1.7–7.7)
Neutrophils Relative %: 56 %
Platelet Count: 289 10*3/uL (ref 150–400)
RBC: 4.93 MIL/uL (ref 3.87–5.11)
RDW: 14.6 % (ref 11.5–15.5)
WBC Count: 4.1 10*3/uL (ref 4.0–10.5)
nRBC: 0 % (ref 0.0–0.2)

## 2023-12-01 MED ORDER — ABEMACICLIB 50 MG PO TABS
50.0000 mg | ORAL_TABLET | Freq: Two times a day (BID) | ORAL | 3 refills | Status: DC
  Filled 2023-12-01: qty 56, 28d supply, fill #0

## 2023-12-01 MED ORDER — DENOSUMAB 120 MG/1.7ML ~~LOC~~ SOLN
120.0000 mg | Freq: Once | SUBCUTANEOUS | Status: AC
  Administered 2023-12-01: 120 mg via SUBCUTANEOUS
  Filled 2023-12-01: qty 1.7

## 2023-12-01 MED ORDER — OXYCODONE-ACETAMINOPHEN 7.5-325 MG PO TABS
1.0000 | ORAL_TABLET | Freq: Four times a day (QID) | ORAL | 0 refills | Status: DC | PRN
Start: 2023-12-01 — End: 2023-12-21
  Filled 2023-12-01: qty 102, 13d supply, fill #0

## 2023-12-01 NOTE — Progress Notes (Signed)
 [facility] Cancer Follow up:    Marius Siemens, NP [address] [place] [address]   DIAGNOSIS:  Cancer Staging  Malignant neoplasm of upper-outer quadrant of left breast in female, estrogen receptor positive (HCC) Staging form: Breast, AJCC 8th Edition - Clinical stage from 12/17/2020: Stage IA (cT1b, cN0, cM0, G2, ER+, PR+, HER2-) - Signed by Percival Brace, NP on 01/14/2022 Stage prefix: Initial diagnosis Histologic grading system: 3 grade system Laterality: Left Staged by: Pathologist and managing physician Stage used in treatment planning: Yes National guidelines used in treatment planning: Yes Type of national guideline used in treatment planning: NCCN - Pathologic stage from 07/09/2021: No Stage Recommended (ypTis (DCIS), pN0, cM0) - Signed by Percival Brace, NP on 07/22/2021 Stage prefix: Post-therapy  Malignant neoplasm of upper-outer quadrant of right breast in female, estrogen receptor positive (HCC) Staging form: Breast, AJCC 8th Edition - Clinical stage from 12/17/2020: Stage IIA (cT2, cN1, cM0, G2, ER+, PR+, HER2-) - Signed by Percival Brace, NP on 01/14/2022 Stage prefix: Initial diagnosis Histologic grading system: 3 grade system Laterality: Right Staged by: Pathologist and managing physician Stage used in treatment planning: Yes National guidelines used in treatment planning: Yes Type of national guideline used in treatment planning: NCCN - Pathologic stage from 07/09/2021: No Stage Recommended (ypT2, pN1a, cM0, G2) - Signed by Percival Brace, NP on 07/22/2021 Stage prefix: Post-therapy Histologic grading system: 3 grade system - Pathologic: Stage IV (cM1) - Signed by Percival Brace, NP on 12/07/2022    SUMMARY OF ONCOLOGIC HISTORY: Oncology History  Malignant neoplasm of upper-outer quadrant of right breast in female, estrogen receptor positive (HCC)  12/12/2020 Initial Diagnosis   status  post bilateral breast biopsies 12/03/2020, showing             (1) on the right, a clinical T2 N1, stage IIa invasive lobular carcinoma, grade 2, estrogen and progesterone receptor strongly positive, HER2 not amplified, with an MIB-1 of 40%                         (a) the biopsied right axillary lymph node was positive with extracapsular extension                         (b) a second right breast mass also biopsied was a fibroadenoma, concordant  MAMMAPRINT tested on biopsy returned high risk, luminal type B, indicating significant benefit from chemo                         (c) biopsy of an area of non-mass-like enhancement in the upper right breast pending   12/17/2020 Cancer Staging   Staging form: Breast, AJCC 8th Edition - Clinical stage from 12/17/2020: Stage IIA (cT2, cN1, cM0, G2, ER+, PR+, HER2-) - Signed by Percival Brace, NP on 01/14/2022 Stage prefix: Initial diagnosis Histologic grading system: 3 grade system Laterality: Right Staged by: Pathologist and managing physician Stage used in treatment planning: Yes National guidelines used in treatment planning: Yes Type of national guideline used in treatment planning: NCCN   12/24/2020 Genetic Testing   Negative genetic testing:  No pathogenic variants detected on the [employer] BRCAplus panel (report date 12/24/2020) or the CancerNext-Expanded + RNAinsight panel (report date 12/31/2020). 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   The BRCAplus panel offered by [employer] and  includes sequencing and deletion/duplication analysis for the following 8 genes: ATM, BRCA1, BRCA2, CDH1, CHEK2, PALB2, PTEN, and TP53. The CancerNext-Expanded + RNAinsight gene panel offered by [employer] and includes sequencing and rearrangement analysis for the following 77 genes: AIP, ALK, APC, ATM, AXIN2, BAP1, BARD1, BLM, BMPR1A, BRCA1, BRCA2, BRIP1, CDC73, CDH1, CDK4, CDKN1B, CDKN2A, CHEK2, CTNNA1,  DICER1, FANCC, FH, FLCN, GALNT12, KIF1B, LZTR1, MAX, MEN1, MET, MLH1, MSH2, MSH3, MSH6, MUTYH, NBN, NF1, NF2, NTHL1, PALB2, PHOX2B, PMS2, POT1, PRKAR1A, PTCH1, PTEN, RAD51C, RAD51D, RB1, RECQL, RET, SDHA, SDHAF2, SDHB, SDHC, SDHD, SMAD4, SMARCA4, SMARCB1, SMARCE1, STK11, SUFU, TMEM127, TP53, TSC1, TSC2, VHL and XRCC2 (sequencing and deletion/duplication); EGFR, EGLN1, HOXB13, KIT, MITF, PDGFRA, POLD1 and POLE (sequencing only); EPCAM and GREM1 (deletion/duplication only). RNA data is routinely analyzed for use in variant interpretation for all genes.   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 Multifocal sclerotic bony metastatic disease greatest in the thoracic spine with scattered rib lesions and sclerosis as well. 2. No nodal or visceral metastasis about the chest. 3. Collateral pathways about the chest suggest some subclavian venous narrowing on the LEFT. This could also be related to arm position. Correlate clinically. 4. Signs of RIGHT breast  lumpectomy. Potential postoperative changes in the LEFT breast with small focal area of nodularity in the lateral LEFT breast which could also be postoperative, correlate clinically and consider mammographic correlation as warranted. 5. Mild cardiac enlargement.   05/18/2022 Treatment Plan Change   Anastrozole  + Verzenio  100mg  PO BID   12/07/2022 Cancer Staging   Staging form: Breast, AJCC 8th Edition - Pathologic: Stage IV (cM1) - Signed by Percival Brace, NP on 12/07/2022   Malignant neoplasm of upper-outer quadrant of left breast in female, estrogen receptor positive (HCC)  12/12/2020 Initial Diagnosis   on the left, a clinical T1b N0, stage IA invasive ductal carcinoma, grade 1 or 2, estrogen and progesterone receptor positive, HER2 not amplified, with an MIB 1 of 15%   12/17/2020 Cancer Staging   Staging form: Breast, AJCC 8th Edition - Clinical stage from 12/17/2020: Stage IA (cT1b, cN0, cM0, G2, ER+, PR+, HER2-) - Signed by Percival Brace, NP on 01/14/2022 Stage prefix: Initial diagnosis Histologic grading system: 3 grade system Laterality: Left Staged by: Pathologist and managing physician Stage used in treatment planning: Yes National guidelines used in treatment planning: Yes Type of national guideline used in treatment planning: NCCN   12/24/2020 Genetic Testing   Negative genetic testing:  No pathogenic variants detected on the [facility] BRCAplus panel (report date 12/24/2020) or the CancerNext-Expanded + RNAinsight panel (  report date 12/31/2020). 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   The BRCAplus panel offered by [employer] and includes sequencing and deletion/duplication analysis for the following 8 genes: ATM, BRCA1, BRCA2, CDH1, CHEK2, PALB2, PTEN, and TP53. The CancerNext-Expanded + RNAinsight gene panel offered by [employer] and includes sequencing and rearrangement analysis for the following 77  genes: AIP, ALK, APC, ATM, AXIN2, BAP1, BARD1, BLM, BMPR1A, BRCA1, BRCA2, BRIP1, CDC73, CDH1, CDK4, CDKN1B, CDKN2A, CHEK2, CTNNA1, DICER1, FANCC, FH, FLCN, GALNT12, KIF1B, LZTR1, MAX, MEN1, MET, MLH1, MSH2, MSH3, MSH6, MUTYH, NBN, NF1, NF2, NTHL1, PALB2, PHOX2B, PMS2, POT1, PRKAR1A, PTCH1, PTEN, RAD51C, RAD51D, RB1, RECQL, RET, SDHA, SDHAF2, SDHB, SDHC, SDHD, SMAD4, SMARCA4, SMARCB1, SMARCE1, STK11, SUFU, TMEM127, TP53, TSC1, TSC2, VHL and XRCC2 (sequencing and deletion/duplication); EGFR, EGLN1, HOXB13, KIT, MITF, PDGFRA, POLD1 and POLE (sequencing only); EPCAM and GREM1 (deletion/duplication only). RNA data is routinely analyzed for use in variant interpretation for all genes.   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 Multifocal sclerotic bony metastatic disease greatest in the thoracic spine with scattered rib lesions and sclerosis as well. 2. No nodal or visceral metastasis about the chest. 3. Collateral pathways about the chest suggest some subclavian venous narrowing on the LEFT. This  could also be related to arm position. Correlate clinically. 4. Signs of RIGHT breast lumpectomy. Potential postoperative changes in the LEFT breast with small focal area of nodularity in the lateral LEFT breast which could also be postoperative, correlate clinically and consider mammographic correlation as warranted. 5. Mild cardiac enlargement.   05/18/2022 Treatment Plan Change   Anastrozole  + Verzenio  100mg  PO BID   06/23/2022 - 07/07/2022 Radiation Therapy   06/23/2022 through 07/07/2022 Site Technique Total Dose (Gy) Dose per Fx (Gy) Completed Fx Beam Energies  Lumbar Spine: Spine_LS_Pelv 3D 30/30 3 10/10 15X       CURRENT THERAPY: Anastrozole /Verzenio   INTERVAL HISTORY:  Discussed the use of AI scribe software for clinical note transcription with the patient, who gave verbal consent to proceed.  Chelsea Hansen 53 y.o. female with stage IV breast cancer, presents for follow-up of her cancer treatment.  She continues on anastrozole  and Verzenio  for her breast cancer treatment. A PET scan was performed two weeks ago, but the results are pending.  She experiences gastrointestinal issues, including constipation and nausea. She has not had a bowel movement for two to three weeks and has been vomiting. She was hospitalized for two and a half days, during which she used Miralax  and nausea medication. She plans to start these medications today. She has  also tried prune juice and castor oil. She reports significant weight loss since Saturday and continues to experience vomiting at night.  She experiences dizziness, low blood pressure, constipation, nausea, vomiting, and significant weight loss. No new or worsening symptoms related to her cancer treatment.   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   GERD (gastroesophageal reflux disease) 05/27/2021   Hyperlipidemia 05/27/2021   Hypertension 05/27/2021   Pain management contract signed 05/04/2021   Chronic pain syndrome (breast cancer) 05/04/2021   Palpitations 03/25/2021   Sinus bradycardia 03/25/2021   Daytime somnolence 03/25/2021   Snoring 03/25/2021   Obesity (BMI 30-39.9) 03/25/2021   Port-A-Cath in place 01/27/2021   Genetic testing 12/24/2020   Family history of uterine cancer 12/17/2020   Malignant neoplasm of upper-outer quadrant of right breast in female, estrogen receptor positive (HCC) 12/12/2020   Malignant neoplasm of upper-outer quadrant of left breast in female, estrogen receptor positive (HCC) 12/12/2020   Hypokalemia 01/02/2012   Chronic asthma 01/01/2012   Bradycardia 01/01/2012   Syncope 12/31/2011   Mediastinal adenopathy 12/31/2011   Anemia 12/31/2011   Chest pain 12/31/2011    has no known allergies.  MEDICAL HISTORY: Past Medical History:  Diagnosis Date   Allergy    seasonal allergies   Anxiety    on meds   Asthma    uses inhaler   Breast cancer (HCC) 2012   RIGHT lumpectomy   Cancer (HCC) 2022   RIGHT breast lump-dx 2022   Depression    on meds   DVT (deep venous thrombosis) (HCC) 2010   after hysterectomy   Family history of uterine cancer    GERD (gastroesophageal reflux disease)    with certain foods/OTC PRN meds   Headache(784.0)    History of radiation therapy    Bilateral breast- 09/03/21-11/02/21- Dr. Retta Caster   Hyperlipidemia    on meds   Hypertension    on meds   SVT (supraventricular tachycardia) (HCC)     SURGICAL HISTORY: Past Surgical History:  Procedure Laterality Date   ABDOMINAL HYSTERECTOMY     AXILLARY SENTINEL NODE BIOPSY Left 07/09/2021   Procedure: LEFT AXILLARY SENTINEL NODE BIOPSY;   Surgeon: Oza Blumenthal, MD;  Location: [facility];  Service: General;  Laterality: Left;   BIOPSY  06/29/2022   Procedure: BIOPSY;  Surgeon: Albertina Hugger, MD;  Location: [facility];  Service: Gastroenterology;;   BREAST EXCISIONAL BIOPSY Right 09/16/2009   BREAST LUMPECTOMY WITH RADIOACTIVE SEED LOCALIZATION Bilateral 07/09/2021   Procedure: BILATERAL BREAST LUMPECTOMY WITH RADIOACTIVE SEED LOCALIZATION;  Surgeon: Oza Blumenthal, MD;  Location: [facility];  Service: General;  Laterality: Bilateral;   BREAST SURGERY     lumpectomy   ESOPHAGOGASTRODUODENOSCOPY (EGD) WITH PROPOFOL  N/A 06/29/2022   Procedure: ESOPHAGOGASTRODUODENOSCOPY (EGD) WITH PROPOFOL ;  Surgeon: Albertina Hugger, MD;  Location: [facility];  Service: Gastroenterology;  Laterality: N/A;   PORT-A-CATH REMOVAL Left 07/09/2021   Procedure: REMOVAL PORT-A-CATH;  Surgeon: Oza Blumenthal, MD;  Location: [facility];  Service: General;  Laterality: Left;   PORTACATH PLACEMENT Left 01/26/2021   Procedure: INSERTION PORT-A-CATH;  Surgeon: Oza Blumenthal, MD;  Location: [facility];  Service: General;  Laterality: Left;   RADIOACTIVE SEED GUIDED AXILLARY SENTINEL LYMPH NODE Right 07/09/2021   Procedure: RADIOACTIVE SEED GUIDED RIGHT AXILLARY SENTINEL LYMPH NODE DISSECTION;  Surgeon: Oza Blumenthal, MD;  Location: [facility];  Service: General;  Laterality: Right;   TUBAL LIGATION      SOCIAL HISTORY: Social History   Socioeconomic History   Marital status: Single    Spouse name: Not on file   Number of children: Not on file   Years of education: Not on file   Highest education level: Not on file  Occupational History   Not on file  Tobacco Use   Smoking status: Never   Smokeless tobacco: Never  Vaping Use   Vaping status: Never Used  Substance and Sexual Activity   Alcohol use: Not Currently    Alcohol/week: 7.0 standard drinks of alcohol    Types: 7  Standard drinks or equivalent per week    Comment: occassional   Drug use: No   Sexual activity: Not Currently  Other Topics Concern   Not on file  Social History Narrative   Not on file   Social Drivers of Health   Financial Resource Strain: High Risk (01/14/2022)   Overall Financial Resource Strain (CARDIA)    Difficulty of Paying Living Expenses: Very hard  Food Insecurity: No Food Insecurity (11/28/2023)   Hunger Vital Sign    Worried About Running Out of Food in the Last Year: Never true    Ran Out of Food in the Last Year: Never true  Recent Concern: Food Insecurity - Food Insecurity Present (11/24/2023)   Hunger Vital Sign    Worried About Running Out of Food in the Last Year: Sometimes true    Ran Out of Food in the Last Year: Sometimes true  Transportation Needs: No Transportation Needs (11/28/2023)   PRAPARE - Administrator, Civil Service (Medical): No    Lack of Transportation (Non-Medical): No  Physical Activity: Insufficiently Active (08/01/2023)   Exercise Vital Sign    Days of Exercise per Week: 1 day    Minutes of Exercise per Session: 10 min  Stress: Stress Concern Present (10/10/2023)   [employer] of Occupational Health - Occupational Stress Questionnaire    Feeling of Stress : Rather much  Social Connections: Not on file  Intimate Partner Violence: Not At Risk (11/28/2023)   Humiliation, Afraid, Rape, and Kick questionnaire    Fear of Current or Ex-Partner: No    Emotionally Abused: No    Physically Abused: No    Sexually Abused: No    FAMILY HISTORY: Family History  Problem Relation Age of Onset   Breast cancer Mother        64s   Hypertension Father    Uterine cancer Maternal Aunt    Ovarian cancer Maternal Aunt    Breast cancer Maternal Aunt        8s   Breast cancer Cousin 41   Uterine cancer Cousin 50   Colon polyps Neg Hx    Colon cancer Neg Hx    Esophageal cancer Neg Hx    Stomach cancer Neg Hx     Review of Systems   Constitutional:  Negative for appetite change, chills, fatigue, fever and unexpected weight change.  HENT:   Negative for hearing loss, lump/mass and trouble swallowing.   Eyes:  Negative for eye problems and icterus.  Respiratory:  Negative for chest tightness, cough and shortness of  breath.   Cardiovascular:  Negative for chest pain, leg swelling and palpitations.  Gastrointestinal:  Positive for constipation, nausea and vomiting. Negative for abdominal distention, abdominal pain and diarrhea.  Endocrine: Negative for hot flashes.  Genitourinary:  Negative for difficulty urinating.   Musculoskeletal:  Negative for arthralgias.  Skin:  Negative for itching and rash.  Neurological:  Negative for dizziness, extremity weakness, headaches and numbness.  Hematological:  Negative for adenopathy. Does not bruise/bleed easily.  Psychiatric/Behavioral:  Negative for depression. The patient is not nervous/anxious.       PHYSICAL EXAMINATION    Vitals:   12/01/23 1337  BP: 112/65  Pulse: 65  Resp: 18  Temp: 97.7 F (36.5 C)  SpO2: 97%    Physical Exam Constitutional:      General: She is not in acute distress.    Appearance: Normal appearance. She is not toxic-appearing.  HENT:     Head: Normocephalic and atraumatic.     Mouth/Throat:     Mouth: Mucous membranes are moist.     Pharynx: Oropharynx is clear. No oropharyngeal exudate or posterior oropharyngeal erythema.   Eyes:     General: No scleral icterus.   Cardiovascular:     Rate and Rhythm: Normal rate and regular rhythm.     Pulses: Normal pulses.     Heart sounds: Normal heart sounds.  Pulmonary:     Effort: Pulmonary effort is normal.     Breath sounds: Normal breath sounds.  Abdominal:     General: Abdomen is flat. Bowel sounds are normal. There is no distension.     Palpations: Abdomen is soft.     Tenderness: There is no abdominal tenderness.   Musculoskeletal:        General: No swelling.     Cervical  back: Neck supple.  Lymphadenopathy:     Cervical: No cervical adenopathy.   Skin:    General: Skin is warm and dry.     Findings: No rash.   Neurological:     General: No focal deficit present.     Mental Status: She is alert.   Psychiatric:        Mood and Affect: Mood normal.        Behavior: Behavior normal.     LABORATORY DATA:  CBC    Component Value Date/Time   WBC 4.1 12/01/2023 1307   WBC 5.2 11/26/2023 0340   RBC 4.93 12/01/2023 1307   HGB 11.6 (L) 12/01/2023 1307   HGB 11.5 10/06/2020 1432   HCT 36.6 12/01/2023 1307   HCT 36.8 10/06/2020 1432   PLT 289 12/01/2023 1307   PLT 323 10/06/2020 1432   MCV 74.2 (L) 12/01/2023 1307   MCV 73 (L) 10/06/2020 1432   MCH 23.5 (L) 12/01/2023 1307   MCHC 31.7 12/01/2023 1307   RDW 14.6 12/01/2023 1307   RDW 15.9 (H) 10/06/2020 1432   LYMPHSABS 1.1 12/01/2023 1307   LYMPHSABS 1.9 10/06/2020 1432   MONOABS 0.6 12/01/2023 1307   EOSABS 0.1 12/01/2023 1307   EOSABS 0.1 10/06/2020 1432   BASOSABS 0.0 12/01/2023 1307   BASOSABS 0.0 10/06/2020 1432    CMP     Component Value Date/Time   NA 140 12/01/2023 1307   NA 144 11/30/2023 1000   K 3.3 (L) 12/01/2023 1307   CL 96 (L) 12/01/2023 1307   CO2 30 12/01/2023 1307   GLUCOSE 106 (H) 12/01/2023 1307   BUN 18 12/01/2023 1307   BUN 19 11/30/2023 1000  CREATININE 1.07 (H) 12/01/2023 1307   CALCIUM  9.1 12/01/2023 1307   PROT 8.4 (H) 12/01/2023 1307   PROT 7.3 11/30/2023 1000   ALBUMIN 4.5 12/01/2023 1307   ALBUMIN 4.5 11/30/2023 1000   AST 43 (H) 12/01/2023 1307   ALT 53 (H) 12/01/2023 1307   ALKPHOS 191 (H) 12/01/2023 1307   BILITOT 0.6 12/01/2023 1307   GFRNONAA >60 12/01/2023 1307   GFRAA >60 09/07/2019 1340     ASSESSMENT and THERAPY PLAN:   Malignant neoplasm of upper-outer quadrant of right breast in female, estrogen receptor positive (HCC) 12/03/2020: Bilateral breast biopsies: Right breast: T2N1 stage IIa grade 2 ILC, ER/PR positive, HER2 negative,  Ki-67 40%, right axillary lymph node positive with extracapsular extension MammaPrint: High risk 01/27/2021 -06/08/2021 neoadjuvant chemotherapy with dose dense Adriamycin  and Cytoxan  x4 followed by Taxol  x11 METASTATIC DISEASE Right chest wall pain: CT chest 05/13/2022: Multifocal bone metastases thoracic spine and scattered rib lesions, no nodal or visceral metastases.  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 11/21/2022: CT CAP: Bone mets unchanged, 5 mm right lobe of the liver: Stable sigmoid colon: Mild diverticulitis 11/21/2022: Bone scan: Mild activity at L3 vertebral body compression fracture, stable 12/15/2022: MRI lumbar spine: Stable pathologic fracture L3 stable other bone mets 06/10/2023: PET/CT: Interval development of hypermetabolic lymphadenopathy left submandibular region, throughout mediastinal, bilateral hilar, upper abdomen (no uptake in the bones) 07/06/2023: Submandibular lymph node biopsy: Benign  ----------------------------------------------------------------------------------------------------------------------------- Current treatment: VerzEnio  (started 05/21/2022) with anastrozole  Verzinio dose: 50 p.o. twice daily Stage 4 breast cancer Continues on anastrozole  and Verzenio . Awaiting PET scan results. No new symptoms indicating progression. - Continue anastrozole  and Verzenio . - Follow up on PET scan results and communicate findings.  Constipation Chronic constipation with recent exacerbation, unrelieved by current regimen. - Initiate Miralax   twice daily. - Start Senna daily. - Use prune juice and castor oil as adjuncts. - Encourage physical activity.  Nausea and vomiting Persistent nocturnal nausea and vomiting. - Initiate prescribed antiemetic medication.  Weight loss Significant weight loss likely due to gastrointestinal symptoms and inadequate nutrition.  Goals of Care Her son, Arnetta Lank, is her medical care attorney and emergency contact. - Communicate with Arnetta Lank if unable to reach her.  RTC in 12 weeks for labs, f/u, and injection.         All questions were answered. The patient knows to call the clinic with any problems, questions or concerns. We can certainly see the patient much sooner if necessary.  Total encounter time:30 minutes*in face-to-face visit time, chart review, lab review, care coordination, order entry, and documentation of the encounter time.    Alwin Baars, NP 12/04/23 9:14 PM Medical Oncology and Hematology [facility] [address] [place], [place] [address] Tel. [phone]    Fax. [phone]  *Total Encounter Time as defined by the Centers for Medicare and Medicaid Services includes, in addition to the face-to-face time of a patient visit (documented in the note above) non-face-to-face time: obtaining and reviewing outside history, ordering and reviewing medications, tests or procedures, care coordination (communications with other health care professionals or caregivers) and documentation in the  medical record.

## 2023-12-01 NOTE — Telephone Encounter (Signed)
 Spoke with pt in blue room, requested refill, see associated orders.

## 2023-12-02 ENCOUNTER — Other Ambulatory Visit (HOSPITAL_COMMUNITY): Payer: Self-pay

## 2023-12-02 ENCOUNTER — Ambulatory Visit (INDEPENDENT_AMBULATORY_CARE_PROVIDER_SITE_OTHER): Payer: Self-pay | Admitting: Primary Care

## 2023-12-02 ENCOUNTER — Other Ambulatory Visit: Payer: Self-pay

## 2023-12-04 ENCOUNTER — Encounter: Payer: Self-pay | Admitting: Hematology and Oncology

## 2023-12-04 NOTE — Assessment & Plan Note (Signed)
 12/03/2020: Bilateral breast biopsies: Right breast: T2N1 stage IIa grade 2 ILC, ER/PR positive, HER2 negative, Ki-67 40%, right axillary lymph node positive with extracapsular extension MammaPrint: High risk 01/27/2021 -06/08/2021 neoadjuvant chemotherapy with dose dense Adriamycin  and Cytoxan  x4 followed by Taxol  x11 METASTATIC DISEASE Right chest wall pain: CT chest 05/13/2022: Multifocal bone metastases thoracic spine and scattered rib lesions, no nodal or visceral metastases.  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 11/21/2022: CT CAP: Bone mets unchanged, 5 mm right lobe of the liver: Stable sigmoid colon: Mild diverticulitis 11/21/2022: Bone scan: Mild activity at L3 vertebral body compression fracture, stable 12/15/2022: MRI lumbar spine: Stable pathologic fracture L3 stable other bone mets 06/10/2023: PET/CT: Interval development of hypermetabolic lymphadenopathy left submandibular region, throughout mediastinal, bilateral hilar, upper abdomen (no uptake in the bones) 07/06/2023: Submandibular lymph node biopsy: Benign  ----------------------------------------------------------------------------------------------------------------------------- Current treatment: VerzEnio  (started 05/21/2022) with anastrozole  Verzinio dose: 50 p.o. twice daily Stage 4 breast cancer Continues on anastrozole  and Verzenio . Awaiting PET scan results. No new symptoms indicating progression. - Continue anastrozole  and Verzenio . - Follow up on PET scan results and communicate  findings.  Constipation Chronic constipation with recent exacerbation, unrelieved by current regimen. - Initiate Miralax  twice daily. - Start Senna daily. - Use prune juice and castor oil as adjuncts. - Encourage physical activity.  Nausea and vomiting Persistent nocturnal nausea and vomiting. - Initiate prescribed antiemetic medication.  Weight loss Significant weight loss likely due to gastrointestinal symptoms and inadequate nutrition.  Goals of Care Her son, Arnetta Lank, is her medical care attorney and emergency contact. - Communicate with Arnetta Lank if unable to reach her.  RTC in 12 weeks for labs, f/u, and injection.

## 2023-12-05 ENCOUNTER — Other Ambulatory Visit: Payer: Self-pay

## 2023-12-05 ENCOUNTER — Inpatient Hospital Stay (HOSPITAL_COMMUNITY): Payer: MEDICAID

## 2023-12-05 ENCOUNTER — Other Ambulatory Visit (HOSPITAL_COMMUNITY): Payer: Self-pay

## 2023-12-05 ENCOUNTER — Inpatient Hospital Stay (HOSPITAL_COMMUNITY)
Admission: EM | Admit: 2023-12-05 | Discharge: 2023-12-07 | DRG: 683 | Disposition: A | Discharge status: Home / Self Care | Payer: MEDICAID | Attending: Internal Medicine | Admitting: Internal Medicine

## 2023-12-05 ENCOUNTER — Encounter (INDEPENDENT_AMBULATORY_CARE_PROVIDER_SITE_OTHER): Payer: Self-pay | Admitting: Primary Care

## 2023-12-05 ENCOUNTER — Encounter (HOSPITAL_COMMUNITY): Payer: Self-pay

## 2023-12-05 ENCOUNTER — Emergency Department (HOSPITAL_COMMUNITY): Payer: MEDICAID

## 2023-12-05 DIAGNOSIS — C50912 Malignant neoplasm of unspecified site of left female breast: Secondary | ICD-10-CM | POA: Diagnosis present

## 2023-12-05 DIAGNOSIS — H532 Diplopia: Secondary | ICD-10-CM | POA: Diagnosis present

## 2023-12-05 DIAGNOSIS — E785 Hyperlipidemia, unspecified: Secondary | ICD-10-CM | POA: Diagnosis present

## 2023-12-05 DIAGNOSIS — J45909 Unspecified asthma, uncomplicated: Secondary | ICD-10-CM | POA: Diagnosis present

## 2023-12-05 DIAGNOSIS — Z8041 Family history of malignant neoplasm of ovary: Secondary | ICD-10-CM

## 2023-12-05 DIAGNOSIS — Z79899 Other long term (current) drug therapy: Secondary | ICD-10-CM

## 2023-12-05 DIAGNOSIS — I471 Supraventricular tachycardia, unspecified: Secondary | ICD-10-CM | POA: Diagnosis present

## 2023-12-05 DIAGNOSIS — K449 Diaphragmatic hernia without obstruction or gangrene: Secondary | ICD-10-CM | POA: Diagnosis present

## 2023-12-05 DIAGNOSIS — Z79811 Long term (current) use of aromatase inhibitors: Secondary | ICD-10-CM | POA: Diagnosis not present

## 2023-12-05 DIAGNOSIS — I2489 Other forms of acute ischemic heart disease: Secondary | ICD-10-CM | POA: Diagnosis present

## 2023-12-05 DIAGNOSIS — E876 Hypokalemia: Secondary | ICD-10-CM | POA: Diagnosis present

## 2023-12-05 DIAGNOSIS — Z8249 Family history of ischemic heart disease and other diseases of the circulatory system: Secondary | ICD-10-CM | POA: Diagnosis not present

## 2023-12-05 DIAGNOSIS — Z170 Estrogen receptor positive status [ER+]: Secondary | ICD-10-CM

## 2023-12-05 DIAGNOSIS — R918 Other nonspecific abnormal finding of lung field: Secondary | ICD-10-CM | POA: Diagnosis present

## 2023-12-05 DIAGNOSIS — K567 Ileus, unspecified: Secondary | ICD-10-CM | POA: Diagnosis present

## 2023-12-05 DIAGNOSIS — I10 Essential (primary) hypertension: Secondary | ICD-10-CM | POA: Diagnosis present

## 2023-12-05 DIAGNOSIS — C7951 Secondary malignant neoplasm of bone: Secondary | ICD-10-CM | POA: Diagnosis present

## 2023-12-05 DIAGNOSIS — R112 Nausea with vomiting, unspecified: Secondary | ICD-10-CM | POA: Diagnosis present

## 2023-12-05 DIAGNOSIS — R9431 Abnormal electrocardiogram [ECG] [EKG]: Secondary | ICD-10-CM | POA: Diagnosis not present

## 2023-12-05 DIAGNOSIS — C50411 Malignant neoplasm of upper-outer quadrant of right female breast: Secondary | ICD-10-CM

## 2023-12-05 DIAGNOSIS — G893 Neoplasm related pain (acute) (chronic): Secondary | ICD-10-CM | POA: Diagnosis present

## 2023-12-05 DIAGNOSIS — N179 Acute kidney failure, unspecified: Principal | ICD-10-CM | POA: Diagnosis present

## 2023-12-05 DIAGNOSIS — K5903 Drug induced constipation: Secondary | ICD-10-CM | POA: Diagnosis present

## 2023-12-05 DIAGNOSIS — K5909 Other constipation: Secondary | ICD-10-CM | POA: Diagnosis present

## 2023-12-05 DIAGNOSIS — R739 Hyperglycemia, unspecified: Secondary | ICD-10-CM | POA: Diagnosis not present

## 2023-12-05 DIAGNOSIS — Y92009 Unspecified place in unspecified non-institutional (private) residence as the place of occurrence of the external cause: Secondary | ICD-10-CM | POA: Diagnosis not present

## 2023-12-05 DIAGNOSIS — T50995A Adverse effect of other drugs, medicaments and biological substances, initial encounter: Secondary | ICD-10-CM | POA: Diagnosis present

## 2023-12-05 DIAGNOSIS — Z9221 Personal history of antineoplastic chemotherapy: Secondary | ICD-10-CM

## 2023-12-05 DIAGNOSIS — Z8049 Family history of malignant neoplasm of other genital organs: Secondary | ICD-10-CM

## 2023-12-05 DIAGNOSIS — E871 Hypo-osmolality and hyponatremia: Secondary | ICD-10-CM | POA: Diagnosis present

## 2023-12-05 DIAGNOSIS — E8809 Other disorders of plasma-protein metabolism, not elsewhere classified: Secondary | ICD-10-CM | POA: Diagnosis present

## 2023-12-05 DIAGNOSIS — E873 Alkalosis: Secondary | ICD-10-CM | POA: Diagnosis present

## 2023-12-05 DIAGNOSIS — Z86718 Personal history of other venous thrombosis and embolism: Secondary | ICD-10-CM

## 2023-12-05 DIAGNOSIS — R7989 Other specified abnormal findings of blood chemistry: Secondary | ICD-10-CM | POA: Diagnosis present

## 2023-12-05 DIAGNOSIS — Z803 Family history of malignant neoplasm of breast: Secondary | ICD-10-CM

## 2023-12-05 DIAGNOSIS — R569 Unspecified convulsions: Secondary | ICD-10-CM | POA: Diagnosis present

## 2023-12-05 DIAGNOSIS — E860 Dehydration: Secondary | ICD-10-CM | POA: Diagnosis present

## 2023-12-05 DIAGNOSIS — E878 Other disorders of electrolyte and fluid balance, not elsewhere classified: Secondary | ICD-10-CM | POA: Diagnosis present

## 2023-12-05 DIAGNOSIS — Z923 Personal history of irradiation: Secondary | ICD-10-CM

## 2023-12-05 DIAGNOSIS — Z1721 Progesterone receptor positive status: Secondary | ICD-10-CM

## 2023-12-05 DIAGNOSIS — T40605A Adverse effect of unspecified narcotics, initial encounter: Secondary | ICD-10-CM | POA: Diagnosis present

## 2023-12-05 DIAGNOSIS — D630 Anemia in neoplastic disease: Secondary | ICD-10-CM | POA: Diagnosis present

## 2023-12-05 DIAGNOSIS — K5730 Diverticulosis of large intestine without perforation or abscess without bleeding: Secondary | ICD-10-CM | POA: Diagnosis present

## 2023-12-05 DIAGNOSIS — Z6826 Body mass index (BMI) 26.0-26.9, adult: Secondary | ICD-10-CM

## 2023-12-05 DIAGNOSIS — R7401 Elevation of levels of liver transaminase levels: Secondary | ICD-10-CM | POA: Diagnosis present

## 2023-12-05 DIAGNOSIS — Z90710 Acquired absence of both cervix and uterus: Secondary | ICD-10-CM

## 2023-12-05 DIAGNOSIS — E875 Hyperkalemia: Secondary | ICD-10-CM | POA: Diagnosis present

## 2023-12-05 DIAGNOSIS — E663 Overweight: Secondary | ICD-10-CM | POA: Diagnosis present

## 2023-12-05 LAB — CBC WITH DIFFERENTIAL/PLATELET
Abs Immature Granulocytes: 0.05 10*3/uL (ref 0.00–0.07)
Abs Immature Granulocytes: 0.07 10*3/uL (ref 0.00–0.07)
Basophils Absolute: 0 10*3/uL (ref 0.0–0.1)
Basophils Absolute: 0 10*3/uL (ref 0.0–0.1)
Basophils Relative: 0 %
Basophils Relative: 0 %
Eosinophils Absolute: 0 10*3/uL (ref 0.0–0.5)
Eosinophils Absolute: 0 10*3/uL (ref 0.0–0.5)
Eosinophils Relative: 0 %
Eosinophils Relative: 1 %
HCT: 38.2 % (ref 36.0–46.0)
HCT: 34.6 % — ABNORMAL LOW (ref 36.0–46.0)
Hemoglobin: 12 g/dL (ref 12.0–15.0)
Hemoglobin: 10.6 g/dL — ABNORMAL LOW (ref 12.0–15.0)
Immature Granulocytes: 1 %
Immature Granulocytes: 1 %
Lymphocytes Relative: 21 %
Lymphocytes Relative: 19 %
Lymphs Abs: 1.4 10*3/uL (ref 0.7–4.0)
Lymphs Abs: 1 10*3/uL (ref 0.7–4.0)
MCH: 23.5 pg — ABNORMAL LOW (ref 26.0–34.0)
MCH: 23.7 pg — ABNORMAL LOW (ref 26.0–34.0)
MCHC: 31.4 g/dL (ref 30.0–36.0)
MCHC: 30.6 g/dL (ref 30.0–36.0)
MCV: 74.8 fL — ABNORMAL LOW (ref 80.0–100.0)
MCV: 77.4 fL — ABNORMAL LOW (ref 80.0–100.0)
Monocytes Absolute: 0.7 10*3/uL (ref 0.1–1.0)
Monocytes Absolute: 0.7 10*3/uL (ref 0.1–1.0)
Monocytes Relative: 9 %
Monocytes Relative: 12 %
Neutro Abs: 4.8 10*3/uL (ref 1.7–7.7)
Neutro Abs: 3.7 10*3/uL (ref 1.7–7.7)
Neutrophils Relative %: 69 %
Neutrophils Relative %: 67 %
Platelets: 350 10*3/uL (ref 150–400)
Platelets: 253 10*3/uL (ref 150–400)
RBC: 5.11 MIL/uL (ref 3.87–5.11)
RBC: 4.47 MIL/uL (ref 3.87–5.11)
RDW: 14.2 % (ref 11.5–15.5)
RDW: 14.4 % (ref 11.5–15.5)
WBC: 7 10*3/uL (ref 4.0–10.5)
WBC: 5.5 10*3/uL (ref 4.0–10.5)
nRBC: 0 % (ref 0.0–0.2)
nRBC: 0 % (ref 0.0–0.2)

## 2023-12-05 LAB — COMPREHENSIVE METABOLIC PANEL WITH GFR
ALT: 109 U/L — ABNORMAL HIGH (ref 0–44)
ALT: 79 U/L — ABNORMAL HIGH (ref 0–44)
AST: 78 U/L — ABNORMAL HIGH (ref 15–41)
AST: 57 U/L — ABNORMAL HIGH (ref 15–41)
Albumin: 4.7 g/dL (ref 3.5–5.0)
Albumin: 3.6 g/dL (ref 3.5–5.0)
Alkaline Phosphatase: 207 U/L — ABNORMAL HIGH (ref 38–126)
Alkaline Phosphatase: 163 U/L — ABNORMAL HIGH (ref 38–126)
Anion gap: 24 — ABNORMAL HIGH (ref 5–15)
Anion gap: 13 (ref 5–15)
BUN: 47 mg/dL — ABNORMAL HIGH (ref 6–20)
BUN: 38 mg/dL — ABNORMAL HIGH (ref 6–20)
CO2: 35 mmol/L — ABNORMAL HIGH (ref 22–32)
CO2: 35 mmol/L — ABNORMAL HIGH (ref 22–32)
Calcium: 7.1 mg/dL — ABNORMAL LOW (ref 8.9–10.3)
Calcium: 6.9 mg/dL — ABNORMAL LOW (ref 8.9–10.3)
Chloride: 78 mmol/L — ABNORMAL LOW (ref 98–111)
Chloride: 86 mmol/L — ABNORMAL LOW (ref 98–111)
Creatinine, Ser: 3.37 mg/dL — ABNORMAL HIGH (ref 0.44–1.00)
Creatinine, Ser: 1.55 mg/dL — ABNORMAL HIGH (ref 0.44–1.00)
GFR, Estimated: 16 mL/min — ABNORMAL LOW (ref >60)
GFR, Estimated: 40 mL/min — ABNORMAL LOW (ref >60)
Glucose, Bld: 132 mg/dL — ABNORMAL HIGH (ref 70–99)
Glucose, Bld: 109 mg/dL — ABNORMAL HIGH (ref 70–99)
Potassium: 2.8 mmol/L — ABNORMAL LOW (ref 3.5–5.1)
Potassium: 2.6 mmol/L — CL (ref 3.5–5.1)
Sodium: 137 mmol/L (ref 135–145)
Sodium: 134 mmol/L — ABNORMAL LOW (ref 135–145)
Total Bilirubin: 1.4 mg/dL — ABNORMAL HIGH (ref 0.0–1.2)
Total Bilirubin: 0.8 mg/dL (ref 0.0–1.2)
Total Protein: 8.8 g/dL — ABNORMAL HIGH (ref 6.5–8.1)
Total Protein: 7 g/dL (ref 6.5–8.1)

## 2023-12-05 LAB — LIPASE, BLOOD: Lipase: 37 U/L (ref 11–51)

## 2023-12-05 LAB — BASIC METABOLIC PANEL WITH GFR
Anion gap: 16 — ABNORMAL HIGH (ref 5–15)
BUN: 41 mg/dL — ABNORMAL HIGH (ref 6–20)
CO2: 31 mmol/L (ref 22–32)
Calcium: 6.7 mg/dL — ABNORMAL LOW (ref 8.9–10.3)
Chloride: 88 mmol/L — ABNORMAL LOW (ref 98–111)
Creatinine, Ser: 1.74 mg/dL — ABNORMAL HIGH (ref 0.44–1.00)
GFR, Estimated: 35 mL/min — ABNORMAL LOW (ref >60)
Glucose, Bld: 88 mg/dL (ref 70–99)
Potassium: 2.9 mmol/L — ABNORMAL LOW (ref 3.5–5.1)
Sodium: 135 mmol/L (ref 135–145)

## 2023-12-05 LAB — TROPONIN I (HIGH SENSITIVITY): Troponin I (High Sensitivity): 7 ng/L (ref <18)

## 2023-12-05 LAB — PHOSPHORUS
Phosphorus: 4.7 mg/dL — ABNORMAL HIGH (ref 2.5–4.6)
Phosphorus: 4 mg/dL (ref 2.5–4.6)
Phosphorus: 3.2 mg/dL (ref 2.5–4.6)

## 2023-12-05 LAB — MAGNESIUM
Magnesium: 2.8 mg/dL — ABNORMAL HIGH (ref 1.7–2.4)
Magnesium: 2.5 mg/dL — ABNORMAL HIGH (ref 1.7–2.4)
Magnesium: 2.4 mg/dL (ref 1.7–2.4)

## 2023-12-05 LAB — LACTIC ACID, PLASMA: Lactic Acid, Venous: 1 mmol/L (ref 0.5–1.9)

## 2023-12-05 LAB — TSH: TSH: 2.576 u[IU]/mL (ref 0.350–4.500)

## 2023-12-05 MED ORDER — OXYCODONE HCL 5 MG PO TABS: 7.5000 mg | ORAL_TABLET | Freq: Four times a day (QID) | ORAL | Status: DC | PRN

## 2023-12-05 MED ORDER — OXYCODONE HCL 5 MG PO TABS
15.0000 mg | ORAL_TABLET | Freq: Four times a day (QID) | ORAL | Status: DC | PRN
  Administered 2023-12-05: 15 mg via ORAL
  Filled 2023-12-05: qty 3

## 2023-12-05 MED ORDER — MELATONIN 3 MG PO TABS
6.0000 mg | ORAL_TABLET | Freq: Every evening | ORAL | Status: DC | PRN
  Filled 2023-12-05: qty 2

## 2023-12-05 MED ORDER — IOHEXOL 9 MG/ML PO SOLN
ORAL | Status: AC
  Filled 2023-12-05: qty 1000

## 2023-12-05 MED ORDER — POTASSIUM CHLORIDE 10 MEQ/100ML IV SOLN
10.0000 meq | INTRAVENOUS | Status: AC
  Administered 2023-12-05 (×3): 10 meq via INTRAVENOUS
  Filled 2023-12-05 (×2): qty 100

## 2023-12-05 MED ORDER — CALCIUM GLUCONATE-NACL 2-0.675 GM/100ML-% IV SOLN
2.0000 g | Freq: Once | INTRAVENOUS | Status: AC
  Administered 2023-12-05: 2000 mg via INTRAVENOUS
  Filled 2023-12-05: qty 100

## 2023-12-05 MED ORDER — LACTATED RINGERS IV BOLUS
1000.0000 mL | Freq: Once | INTRAVENOUS | Status: AC
  Administered 2023-12-05: 1000 mL via INTRAVENOUS

## 2023-12-05 MED ORDER — PROCHLORPERAZINE EDISYLATE 10 MG/2ML IJ SOLN
10.0000 mg | Freq: Once | INTRAMUSCULAR | Status: AC
  Administered 2023-12-05: 10 mg via INTRAVENOUS
  Filled 2023-12-05: qty 2

## 2023-12-05 MED ORDER — MONTELUKAST SODIUM 10 MG PO TABS: 10.0000 mg | ORAL_TABLET | Freq: Every evening | ORAL | Status: DC | PRN

## 2023-12-05 MED ORDER — POTASSIUM CHLORIDE 10 MEQ/100ML IV SOLN: 10.0000 meq | INTRAVENOUS | Status: DC

## 2023-12-05 MED ORDER — OXYCODONE HCL ER 10 MG PO T12A
10.0000 mg | EXTENDED_RELEASE_TABLET | Freq: Two times a day (BID) | ORAL | Status: DC
  Administered 2023-12-06 – 2023-12-07 (×2): 10 mg via ORAL
  Filled 2023-12-05 (×5): qty 1

## 2023-12-05 MED ORDER — POTASSIUM CHLORIDE 10 MEQ/100ML IV SOLN
10.0000 meq | INTRAVENOUS | Status: AC
  Administered 2023-12-05 (×3): 10 meq via INTRAVENOUS
  Filled 2023-12-05 (×3): qty 100

## 2023-12-05 MED ORDER — BISACODYL 10 MG RE SUPP: 10.0000 mg | Freq: Once | RECTAL | Status: DC

## 2023-12-05 MED ORDER — GABAPENTIN 300 MG PO CAPS
300.0000 mg | ORAL_CAPSULE | Freq: Two times a day (BID) | ORAL | Status: DC
  Administered 2023-12-06 – 2023-12-07 (×2): 300 mg via ORAL
  Filled 2023-12-05 (×5): qty 1

## 2023-12-05 MED ORDER — SODIUM CHLORIDE 0.9% FLUSH
3.0000 mL | Freq: Two times a day (BID) | INTRAVENOUS | Status: DC
  Administered 2023-12-05 – 2023-12-07 (×5): 3 mL via INTRAVENOUS

## 2023-12-05 MED ORDER — SODIUM CHLORIDE 0.9 % IV BOLUS
1000.0000 mL | Freq: Once | INTRAVENOUS | Status: AC
  Administered 2023-12-05: 1000 mL via INTRAVENOUS

## 2023-12-05 MED ORDER — SENNA 8.6 MG PO TABS
2.0000 | ORAL_TABLET | Freq: Every day | ORAL | Status: DC
  Filled 2023-12-05 (×2): qty 2

## 2023-12-05 MED ORDER — BISACODYL 10 MG RE SUPP: 10.0000 mg | Freq: Every day | RECTAL | Status: DC | PRN

## 2023-12-05 MED ORDER — LACTATED RINGERS IV SOLN
INTRAVENOUS | Status: AC
  Administered 2023-12-05: 100 mL/h via INTRAVENOUS

## 2023-12-05 MED ORDER — IOHEXOL 9 MG/ML PO SOLN
500.0000 mL | ORAL | Status: AC
  Administered 2023-12-05 (×2): 500 mL via ORAL

## 2023-12-05 MED ORDER — POTASSIUM CHLORIDE CRYS ER 20 MEQ PO TBCR
40.0000 meq | EXTENDED_RELEASE_TABLET | Freq: Once | ORAL | Status: DC
  Filled 2023-12-05: qty 2

## 2023-12-05 MED ORDER — ACETAMINOPHEN 500 MG PO TABS: 1000.0000 mg | ORAL_TABLET | Freq: Four times a day (QID) | ORAL | Status: DC | PRN

## 2023-12-05 MED ORDER — ALBUTEROL SULFATE (2.5 MG/3ML) 0.083% IN NEBU: 2.5000 mg | INHALATION_SOLUTION | RESPIRATORY_TRACT | Status: DC | PRN

## 2023-12-05 MED ORDER — LORAZEPAM 2 MG/ML IJ SOLN
0.5000 mg | Freq: Once | INTRAMUSCULAR | Status: AC | PRN
  Administered 2023-12-05: 0.5 mg via INTRAVENOUS
  Filled 2023-12-05: qty 1

## 2023-12-05 MED ORDER — CHLORHEXIDINE GLUCONATE CLOTH 2 % EX PADS
6.0000 | MEDICATED_PAD | Freq: Every day | CUTANEOUS | Status: DC
  Administered 2023-12-05 – 2023-12-06 (×2): 6 via TOPICAL

## 2023-12-05 MED ORDER — LORAZEPAM 2 MG/ML IJ SOLN
0.5000 mg | INTRAMUSCULAR | Status: DC | PRN
  Administered 2023-12-05 (×3): 0.5 mg via INTRAVENOUS
  Filled 2023-12-05 (×3): qty 1

## 2023-12-05 MED ORDER — POLYETHYLENE GLYCOL 3350 17 G PO PACK
17.0000 g | PACK | Freq: Two times a day (BID) | ORAL | Status: DC
  Administered 2023-12-05 – 2023-12-07 (×4): 17 g via ORAL
  Filled 2023-12-05 (×6): qty 1

## 2023-12-05 NOTE — ED Notes (Addendum)
 This RN went to assess patient. Pt reports to this RN that she felt dizzy and was seeing 3 of everything. Pt was drowsy and weak. This RN performed NIH on patient.

## 2023-12-05 NOTE — ED Notes (Signed)
 Report given to floor nurse

## 2023-12-05 NOTE — ED Triage Notes (Addendum)
 Pt BIBA from work at [employer] c/o vomiting and pain all over. Vomiting started at the beginning of the week. Pt also states she is having blurry vision. Pt states her hands are cramping. VSS.

## 2023-12-05 NOTE — Progress Notes (Signed)
 Care started prior to midnight in the emergency room.  Was admitted at this morning after midnight by Dr. Arnulfo Larch and I am a current agreement with his assessment and plan.  Additional changes of plan of care been made accordingly.  The patient is a overweight African-American female with a past medical history significant for but not limited too metastatic breast cancer to the bone status post neoadjuvant chemotherapy lumpectomy, and SLN biopsy, radiation who is on as anastrozole  and abemaciclib  with additional history including asthma, SVT, hypertension, hyperlipidemia, chronic cancer-related pain who was recently admitted from 6 5 until 6 7 with intractable nausea vomiting constipation treated symptomatically who now returns with continued intractable nausea vomiting and constipation.  She reported since discharge she has had difficulty eating and keeping her food down and having nausea vomiting.  She has been constipated and denied a bowel movement since her last discharge and continues to have intermittent diffuse abdominal pain.  Overnight family reported that they witnessed her eyes rolling into the back of her head and having seizure activity.  Currently she is being admitted treated for fall with a limited to:  Intractable nausea and vomiting   Constipation Hx recent admission for similar symptoms 6/5 - 6/7 and improved with symptomatic treatment. Previous imaging with CT A/P on 6/4 with moderate stool burden but no acute findings; however PET on 6/5 did note stomach and proximal duodenal distension with bowel wall thickening in the proximal duodenum. Symptoms recurred after discharge and have continued to present, this admission noted to have associated AKI stage III, and electrolyte derangements below, significant dehydration and contraction. Etiology possibly related to Abemaciclib  (CDK4/6 inhibitor) with high rate of N/V as adverse effect, ileus with associated constipation, possibly  anatomical with question of SMA syndrome considering recent weight loss and recent PET imaging with gastric / prox duodenal distension, less likely gastroenteritis with prolonged symptoms, less likely CNS mets although would consider as possibility. Constipation may be opiate induced  - Checked KUB and showed a nonobstructive bowel gas pattern., will check CT A/P with PO contrast when able to eval for proximal duodenal obstruction / SMA syndrome type findings. - S/p 2 L IV fluid, continue MIVF LR at 100 cc an hour until able to take more p.o. and will continued for another day - Symptomatic management: Ativan  as needed first-line antiemetic due to prolonged Qtc; can switch to Zofran  / compazine  when Qtc improves  - Bowel regimen ordered  - Clear liquid diet, advance as tolerates - Nutrition evaluation - Her Abemaciclib  will be held, should involve Onc re: stopping at time of discharge considering her significant GI symptoms.   Seizure-like activity: Family states that they witnessed her having seizure episodes which she does not have a history of seizures.  Check MRI of the brain, EEG and placed on seizure precautions.  Will reach out to neurology for further evaluation   Acute kidney injury stage IIIa Baseline creatinine 0.8-0.9.  Elevated at 3.37 on admission.  With prerenal azotemia BUN elevated at 47.  Overall history and findings suggestive of prerenal injury possible conversion to intrinsic injury. BUN/Cr Trend: Recent Labs  Lab 11/24/23 0858 11/25/23 0319 11/26/23 0340 11/30/23 1000 12/01/23 1307 12/05/23 0123 12/05/23 1416  BUN 15 10 12 19 18  47* 41*  CREATININE 0.85 0.80 0.96 0.94 1.07* 3.37* 1.74*  -IVF as above.  Checking UA and still pending.  Will consider renal ultrasound if not improving Avoid Nephrotoxic Medications, Contrast Dyes, Hypotension and Dehydration to Ensure Adequate  Renal Perfusion and will need to Renally Adjust Meds -Continue to Monitor and Trend Renal  Function carefully and repeat CMP in the AM    Multiple electrolyte abnormalities  Severe Hypocalcemia, symptomatic  Hypokalemia  Hypochloremia  Contraction Alkalosis  All presumed secondary to GI losses and dehydration  -Serial electrolyte repletion's, maintenance IV fluid per above.  -Repeat potassium with IV KCl 30 mEQ x 3 -Repeat BMP mag Phos at 2 PM   Increased anion gap, possible mixed acid base disorder  -Check lactate   Abnormal EKG  Hx episode of chest pain / SOB  Reports episode of CP / Sob prior to presentation. EKG with ischemic global ST depression.  Possible demand ischemia in the setting of hypovolemia - Check high-sensitivity troponin   Prolonged Qtc QTc 592, in setting of hypokalemia, hypocalcemia  -Serial electrolyte repletion's. -Avoid QT prolonging medications -Repeat EKG tomorrow for monitoring   Acute liver injury, hepatocellular pattern  Chronic elevation of Alk phos likely secondary to bone mets  Mild elevation in transaminase with AST 78, ALT 109, T Bili 1.4. Alk phos chronic elevation currently 207 likely from bone. Suspect transaminase elevation I/s/o systemic illness per above, possibly related to Verzenio .  Recent Labs  Lab 11/23/23 1241 11/24/23 0858 11/25/23 0319 11/30/23 1000 12/01/23 1307 12/05/23 0123  AST 32 31 30 27  43* 78*  ALT 31 32 32 45* 53* 109*  BILITOT 0.8 0.5 0.5 0.3 0.6 1.4*  ALKPHOS 144* 138* 143* 223* 191* 207*  -Continue to Monitor and Trend LFT, check lipase with GI symptoms   Hypoalbuminemia: Patient's Albumin Trend: Recent Labs  Lab 11/23/23 1241 11/24/23 0858 11/25/23 0319 11/30/23 1000 12/01/23 1307 12/05/23 0123  ALBUMIN 4.2 3.9 3.7 4.5 4.5 4.7  -Continue to Monitor and Trend and repeat CMP in the AM   Chronic medical problems:  Metastatic breast CA to bone: s/p neoadjuvant chemo, lumpectomy and SLN Bx, radiation, now on Anastrazole and Abemaciclib ; Follows with Oncology Dr. [name] and NP Debbie Fails, added to  treatment team. See above re: possible GI AE of Abemaciclib  would discuss discontinuation at time of discharge.  Asthma: albuterol  prn, continue home singulair   SVT: Not taking propranolol  prior to admission, continue to hold  HTN: antiHTN have been discontinued last admission  HLD: Not on cholestrol lowering agents  Chronic malignancy related pain: Reduce Gabapentin  to 300 mg BID in setting of renal injury. Continue home Oxycontin  10 mg q 12 hr, and Oxycodone  7.5 / 15 mg for mod/severe q 6 hr prn.  Pulmonary nodules: Noted on outpatient imaging, without PET uptake, continue outpatient followup   Will continue to monitor the patient's clinical response to intervention and follow-up on specialist recommendations and follow-up on imaging studies and repeat blood work in the AM.

## 2023-12-05 NOTE — ED Notes (Addendum)
 This RN face to face notified Dr. Candelaria Chaco and Atlee Leach, PA of patient NIH score and assessment findings. No response given to this RN.

## 2023-12-05 NOTE — ED Notes (Signed)
 Pt provided with pillow and warm blanket. This RN applied oxygen 2L [place] for desat when sleeping.

## 2023-12-05 NOTE — H&P (Signed)
 History and Physical    Chelsea Hansen ZOX:[id number] DOB: 06/07/1971 DOA: 12/05/2023  PCP: Marius Siemens, NP   Patient coming from: Home   Chief Complaint:  Chief Complaint  Patient presents with   Generalized Body Aches   Emesis    HPI:  Chelsea Hansen is a 53 y.o. 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 Returns today with continued intractable N/V and constipation. Reports since discharge continues to have N/V, unable to tolerate significant POs. Emesis described as regurgitated food / liquid. Nonbloody, nonbilious. Is constipated and denies any bowel movement since her last discharge. Has intermittent diffuse abd pain. Also reports having paresthesias and cramping in her hands and feet today. Also reports having diplopia, although has resolved. Also reports episode of chest pain and SOB earlier today. Otherwise No changes in medication. No fever, chills. No headache, speech changes, focal numbness or weakness.   Review of Systems:  ROS complete and negative except as marked above   No Known Allergies  Prior to Admission medications   Medication Sig Start Date End Date Taking? Authorizing Provider  abemaciclib  (VERZENIO ) 50 MG tablet Take 1 tablet (50 mg total) by mouth 2 (two) times daily. 12/01/23   [name], [name], MD  albuterol  (VENTOLIN  HFA) 108 (90 Base) MCG/ACT inhaler Inhale 2 puffs by mouth into the lungs every 6 hours as needed for wheezing or shortness of breath. 03/18/23   Marius Siemens, NP  anastrozole  (ARIMIDEX ) 1 MG tablet Take 1 tablet (1 mg total) by mouth daily. 04/18/23   Causey, Laura Polio, NP  calcium -vitamin D (OSCAL WITH D) 500-5  MG-MCG tablet Take 1 tablet by mouth daily with supper. Patient not taking: Reported on 11/24/2023 06/24/22   [name], [name], MD  gabapentin  (NEURONTIN ) 300 MG capsule Take 1 capsule (300 mg total) by mouth 3 (three) times daily. 05/26/23   [name], [name], MD  montelukast  (SINGULAIR ) 10 MG tablet Take 1 tablet (10 mg total) by mouth at bedtime. Patient taking differently: Take 10 mg by mouth at bedtime as needed (for asthma flares or allergic symptoms). 05/01/23   Marius Siemens, NP  ondansetron  (ZOFRAN -ODT) 4 MG disintegrating tablet Dissolve 1 tablet (4 mg total) by mouth every 8 (eight) hours as needed for nausea or vomiting. Patient taking differently: Take 4 mg by mouth every 8 (eight) hours as needed for nausea or vomiting (dissolve orally). 11/23/23   Scott, Angelita Kendall, PA-C  oxyCODONE  (OXYCONTIN ) 10 mg 12 hr tablet Take 1 tablet (10 mg total) by mouth every 12 (twelve) hours. 11/29/23   [name], Athena N, NP  oxyCODONE -acetaminophen  (PERCOCET) 7.5-325 MG tablet Take 1-2 tablets by mouth every 6 (six) hours as needed for severe pain (pain score 7-10). 12/01/23   [name], Giles Labrum, NP  polyethylene glycol (MIRALAX  / GLYCOLAX ) 17 g packet Take 17 g by mouth 2 (two) times daily. Patient not taking: Reported on 11/24/2023 06/30/22   [name], Hurman Maiden, MD  polyethylene glycol powder (GLYCOLAX /MIRALAX ) 17 GM/SCOOP powder Mix 17 g in 4 oz of liquid and take by mouth 2 (two) times daily as needed. 11/30/23   Marius Siemens, NP  prochlorperazine  (COMPAZINE ) 10 MG tablet Take 1 tablet (10 mg total) by mouth every 6 (  six) hours as needed for nausea or vomiting. Patient not taking: Reported on 11/28/2023 11/26/23   Armenta Landau, MD  prochlorperazine  (COMPAZINE ) 10 MG tablet Take 1 tablet (10 mg total) by mouth every 6 (six) hours as needed for nausea or vomiting. 11/30/23   Marius Siemens, NP  propranolol  (INDERAL ) 10 MG tablet Take 1 tablet (10 mg total) by mouth 2 (two) times daily. Please  call [phone] to schedule an appointment for future refills. Thank you. 3rd attempt. Patient not taking: Reported on 11/24/2023 06/01/23   [name], [name], DO  Chelsea (SENOKOT) 8.6 MG TABS tablet Take 2 tablets (17.2 mg total) by mouth at bedtime. Patient not taking: Reported on 11/24/2023 06/30/22   [name], Hurman Maiden, MD  sennosides-docusate sodium  (SENOKOT-S) 8.6-50 MG tablet Take 1 tablet by mouth daily. 11/30/23   Marius Siemens, NP    Past Medical History:  Diagnosis Date   Allergy    seasonal allergies   Anxiety    on meds   Asthma    uses inhaler   Breast cancer (HCC) 2012   RIGHT lumpectomy   Cancer (HCC) 2022   RIGHT breast lump-dx 2022   Depression    on meds   DVT (deep venous thrombosis) (HCC) 2010   after hysterectomy   Family history of uterine cancer    GERD (gastroesophageal reflux disease)    with certain foods/OTC PRN meds   Headache(784.0)    History of radiation therapy    Bilateral breast- 09/03/21-11/02/21- Dr. Retta Caster   Hyperlipidemia    on meds   Hypertension    on meds   SVT (supraventricular tachycardia) [facility])     Past Surgical History:  Procedure Laterality Date   ABDOMINAL HYSTERECTOMY     AXILLARY SENTINEL NODE BIOPSY Left 07/09/2021   Procedure: LEFT AXILLARY SENTINEL NODE BIOPSY;  Surgeon: Oza Blumenthal, MD;  Location: [facility];  Service: General;  Laterality: Left;   BIOPSY  06/29/2022   Procedure: BIOPSY;  Surgeon: Albertina Hugger, MD;  Location: [facility];  Service: Gastroenterology;;   BREAST EXCISIONAL BIOPSY Right 09/16/2009   BREAST LUMPECTOMY WITH RADIOACTIVE SEED LOCALIZATION Bilateral 07/09/2021   Procedure: BILATERAL BREAST LUMPECTOMY WITH RADIOACTIVE SEED LOCALIZATION;  Surgeon: Oza Blumenthal, MD;  Location: [facility];  Service: General;  Laterality: Bilateral;   BREAST SURGERY     lumpectomy   ESOPHAGOGASTRODUODENOSCOPY (EGD) WITH PROPOFOL  N/A 06/29/2022   Procedure:  ESOPHAGOGASTRODUODENOSCOPY (EGD) WITH PROPOFOL ;  Surgeon: Albertina Hugger, MD;  Location: [facility];  Service: Gastroenterology;  Laterality: N/A;   PORT-A-CATH REMOVAL Left 07/09/2021   Procedure: REMOVAL PORT-A-CATH;  Surgeon: Oza Blumenthal, MD;  Location: [facility];  Service: General;  Laterality: Left;   PORTACATH PLACEMENT Left 01/26/2021   Procedure: INSERTION PORT-A-CATH;  Surgeon: Oza Blumenthal, MD;  Location: [facility];  Service: General;  Laterality: Left;   RADIOACTIVE SEED GUIDED AXILLARY SENTINEL LYMPH NODE Right 07/09/2021   Procedure: RADIOACTIVE SEED GUIDED RIGHT AXILLARY SENTINEL LYMPH NODE DISSECTION;  Surgeon: Oza Blumenthal, MD;  Location: [facility];  Service: General;  Laterality: Right;   TUBAL LIGATION       reports that she has never smoked. She has never used smokeless tobacco. She reports that she does not currently use alcohol after a past usage of about 7.0 standard drinks of alcohol per week. She reports that she does not use drugs.  Family History  Problem Relation Age of Onset  Breast cancer Mother        22s   Hypertension Father    Uterine cancer Maternal Aunt    Ovarian cancer Maternal Aunt    Breast cancer Maternal Aunt        22s   Breast cancer Cousin 8   Uterine cancer Cousin 67   Colon polyps Neg Hx    Colon cancer Neg Hx    Esophageal cancer Neg Hx    Stomach cancer Neg Hx      Physical Exam: Vitals:   12/05/23 0258 12/05/23 0300 12/05/23 0330 12/05/23 0500  BP:  107/74 105/72   Pulse: 74 61 62   Resp: 16 (!) 9 14   Temp:    98 F (36.7 C)  TempSrc:    Oral  SpO2: (!) 88% 100% 100%   Weight:      Height:        Gen: Somnolent (after Ativan ), but awakens to voice and maintain alertness. Acutely and chronically ill appearing.  CV: Regular, normal S1, S2, no murmurs  Resp: Normal WOB, CTAB  Abd: Flat, very hypoactive, mild epigastric tenderness  MSK: Symmetric, no edema  Skin: No rashes  or lesions to exposed skin  Neuro:Somnolent (after Ativan ), but awakens to voice and maintain alertness. Conjugate gaze. CN 2-12 intact, motor is 4/5 and symmetric, sensation intact and equal  Psych: euthymic, appropriate    Data review:   Labs reviewed, notable for:   K2.8, chloride 78, bicarb 35, anion gap 24, BUN 47, creatinine 3.37 (baseline 0.8-0.9), calcium  7.1 with normal albumin T. bili 1.4, AST 78, ALT 109, alk phos 2 Hemoglobin 12 up from baseline around 10  Micro:  Results for orders placed or performed in visit on 03/21/23  COVID-19, Flu A+B and RSV     Status: None   Collection Time: 03/21/23  1:55 PM   Specimen: Nasal Swab   Nasal Swab  Resident i  Result Value Ref Range Status   SARS-CoV-2, NAA Not Detected Not Detected Final   Influenza A, NAA Not Detected Not Detected Final   Influenza B, NAA Not Detected Not Detected Final   RSV, NAA Not Detected Not Detected Final   Test Information: Comment  Final    Comment: This nucleic acid amplification test was developed and its performance characteristics determined by [employer]. Nucleic acid amplification tests include RT-PCR and TMA. This test has not been FDA cleared or approved. This test has been authorized by FDA under an Emergency Use Authorization (EUA). This test is only authorized for the duration of time the declaration that circumstances exist justifying the authorization of the emergency use of in vitro diagnostic tests for detection of SARS-CoV-2 virus and/or diagnosis of COVID-19 infection under section 564(b)(1) of the Act, 21 U.S.C. 956OZH-0(Q) (1), unless the authorization is terminated or revoked sooner. When diagnostic testing is negative, the possibility of a false negative result should be considered in the context of a patient's recent exposures and the presence of clinical signs and symptoms consistent with COVID-19. An individual without symptoms of COVID-19 and who is not shedding  SARS-CoV-2 virus wo uld expect to have a negative (not detected) result in this assay.    *Note: Due to a large number of results and/or encounters for the requested time period, some results have not been displayed. A complete set of results can be found in Results Review.    Imaging reviewed:  CT Head Wo Contrast Result Date: 12/05/2023 CLINICAL DATA:  History  of breast cancer presenting with diplopia with dizziness and weakness. EXAM: CT HEAD WITHOUT CONTRAST TECHNIQUE: Contiguous axial images were obtained from the base of the skull through the vertex without intravenous contrast. RADIATION DOSE REDUCTION: This exam was performed according to the departmental dose-optimization program which includes automated exposure control, adjustment of the mA and/or kV according to patient size and/or use of iterative reconstruction technique. COMPARISON:  June 29, 2022 FINDINGS: Brain: No evidence of acute infarction, hemorrhage, hydrocephalus, extra-axial collection or mass lesion/mass effect. Vascular: No hyperdense vessel or unexpected calcification. Skull: Normal. Negative for fracture or focal lesion. Sinuses/Orbits: No acute finding. Other: None. IMPRESSION: No acute intracranial pathology. Electronically Signed   By: Virgle Grime M.D.   On: 12/05/2023 03:29    EKG:  Personally reviewed sinus rhythm with borderline LVH, QTc prolonged at 592, diffuse ST depression  ED Course:  Treated with 2 L IV fluid, Compazine .   Assessment/Plan:  53 y.o. female with hx  Metastatic breast CA to bone, s/p neoadjuvant chemo, lumpectomy and SLN Bx, radiation, now on Anastrazole and Abemaciclib ; additional hx including asthma, SVT, HTN, HLD, chronic cancer related pain, recent admission 6/5-6/7 with intractable N/V and constipation who was treated symptomatically and improved and tolerating diet prior to discharge. Returns today with continued intractable N/V and constipation. C/b Acute kidney injury stage  III, multiple electrolyte derangements.  Intractable nausea and vomiting   Constipation Hx recent admission for similar symptoms 6/5 - 6/7 and improved with symptomatic treatment. Previous imaging with CT A/P on 6/4 with moderate stool burden but no acute findings; however PET on 6/5 did note stomach and proximal duodenal distension with bowel wall thickening in the proximal duodenum. Symptoms recurred after discharge and have continued to present, this admission noted to have associated AKI stage III, and electrolyte derangements below, significant dehydration and contraction. Etiology possibly related to Abemaciclib  (CDK4/6 inhibitor) with high rate of N/V as adverse effect, ileus with associated constipation, possibly anatomical with question of SMA syndrome considering recent weight loss and recent PET imaging with gastric / prox duodenal distension, less likely gastroenteritis with prolonged symptoms, less likely CNS mets although would consider as possibility. Constipation may be opiate induced  - Check KUB, consider CT A/P with PO contrast when able to eval for proximal duodenal obstruction / SMA syndrome type findings. - S/p 2 L IV fluid, continue MIVF LR at 100 cc an hour until able to take more p.o. - Symptomatic management: Ativan  as needed first-line antiemetic due to prolonged Qtc; can switch to Zofran  / compazine  when Qtc improves  - Bowel regimen ordered  - Clear liquid diet, advance as tolerates - Nutrition evaluation - Her Abemaciclib  will be held, should involve Onc re: stopping at time of discharge considering her significant GI symptoms.   Acute kidney injury stage III Baseline creatinine 0.8-0.9.  Elevated at 3.37 on admission.  With prerenal azotemia BUN elevated at 47.  Overall history and findings suggestive of prerenal injury possible conversion to intrinsic injury. -IV fluids per above -UA pending, check PVR  -Consider renal US  if no improvement with treatment above    Multiple electrolyte abnormalities  Severe Hypocalcemia, symptomatic  Hypokalemia  Hypochloremia  Contraction Alkalosis  All presumed secondary to GI losses and dehydration  -Serial electrolyte repletion's, maintenance IV fluid per above. -Repeat BMP mag Phos at 2 PM  Increased anion gap, possible mixed acid base disorder  -Check lactate  Abnormal EKG  Hx episode of chest pain / SOB  Reports episode  of CP / Sob prior to presentation. EKG with ischemic global ST depression.  Possible demand ischemia in the setting of hypovolemia - Check high-sensitivity troponin  Prolonged Qtc QTc 592, in setting of hypokalemia, hypocalcemia  -Serial electrolyte repletion's. -Avoid QT prolonging medications -Repeat EKG tomorrow for monitoring  Acute liver injury, hepatocellular pattern  Chronic elevation of Alk phos likely secondary to bone mets  Mild elevation in transaminase with AST 78, ALT 109, T Bili 1.4. Alk phos chronic elevation currently 207 likely from bone. Suspect transaminase elevation I/s/o systemic illness per above, possibly related to Verzenio .  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 [name] and NP Chelsea Hansen, added to treatment team. See above re: possible GI AE of Abemaciclib  would discuss discontinuation at time of discharge.  Asthma: albuterol  prn, continue home singulair   SVT: Not taking propranolol  prior to admission, continue to hold  HTN: antiHTN have been discontinued last admission  HLD: Not on cholestrol lowering agents  Chronic malignancy related pain: Reduce Gabapentin  to 300 mg BID in setting of renal injury. Continue home Oxycontin  10 mg q 12 hr, and Oxycodone  7.5 / 15 mg for mod/severe q 6 hr prn.  Pulmonary nodules: Noted on outpatient imaging, without PET uptake, continue outpatient  followup   Body mass index is 26.3 kg/m.    DVT prophylaxis:  SCDs Code Status:  Full Code Diet:  Diet Orders (From admission, onward)     Start     Ordered   12/05/23 0445  Diet clear liquid Room service appropriate? Yes; Fluid consistency: Thin  Diet effective now       Question Answer Comment  Room service appropriate? Yes   Fluid consistency: Thin      12/05/23 0454           Family Communication:  Yes discussed with daughter at bedside    Consults:  None   Admission status:   Inpatient, Telemetry bed  Severity of Illness: The appropriate patient status for this patient is INPATIENT. Inpatient status is judged to be reasonable and necessary in order to provide the required intensity of service to ensure the patient's safety. The patient's presenting symptoms, physical exam findings, and initial radiographic and laboratory data in the context of their chronic comorbidities is felt to place them at high risk for further clinical deterioration. Furthermore, it is not anticipated that the patient will be medically stable for discharge from the hospital within 2 midnights of admission.   * I certify that at the point of admission it is my clinical judgment that the patient will require inpatient hospital care spanning beyond 2 midnights from the point of admission due to high intensity of service, high risk for further deterioration and high frequency of surveillance required.*   Arnulfo Larch, MD [facility]  How to contact the [facility] Attending or Consulting provider 7A - 7P or covering provider during after hours 7P -7A, for this patient.  Check the care team in [facility] and look for a) attending/consulting [facility] provider listed and b) the [facility] team listed Log into [email or web address] and use [facility]'s universal password to access. If you do not have the password, please contact the hospital operator. Locate the [facility] provider you are looking for under [facility] and page  to a number that you can be directly reached. If you still have difficulty  reaching the provider, please page the [facility] (Director on Call) for the Hospitalists listed on amion for assistance.  12/05/2023, 5:01 AM

## 2023-12-05 NOTE — Progress Notes (Signed)
 Briefly discussed with Dr. Gillermo Lack  In brief this is a 53 year old patient with a past medical history significant for metastatic breast cancer to the bone s/p neoadjuvant chemotherapy and lumpectomy, radiation, on anastrozole  and Abemaciclib , returned with intractable nausea, vomiting and constipation with multiple electrolyte derangements and acute kidney injury.  Per primary team there was a report of the patient calling out for help prior to having witnessed generalized shaking.  No prior history of seizures.  Was very sleepy on primary team evaluation but perhaps coming around based on ability to tolerate multiple doses of Ativan  and a dose of oxycodone  recently but having trouble staying awake enough/tolerating contrast for CT scan  MRI brain was obtained without contrast due to renal function which is unremarkable EEG has been ordered and is pending  If her mental status improves with supportive care, and EEG results are reassuring, from information provided would not pursue additional inpatient neurological testing at this time -- other than consideration of MRI brain with contrast due to malignancy history and improving renal function now allowing contrast administration  Certainly may have had a provoked seizure in the setting of her multiple electrolyte derangements but the primary treatment for this would be preventing recurrent electrolyte derangements with adequate treatment of her nausea/vomiting/constipation  Baldwin Levee MD-PhD [facility] [phone]   8 min of care, Dr. Gillermo Lack to notify patient/family of interprofessional consultation  These are curbside recommendations based upon the information readily available in the chart on brief review as well as history and examination information provided to me by requesting provider and do not replace a full detailed consult   Basic Metabolic Panel: Recent Labs  Lab 11/30/23 1000 12/01/23 1307 12/05/23 0123  12/05/23 0531 12/05/23 1416 12/05/23 1705  NA 144 140 137  --  135 134*  K 3.9 3.3* 2.8*  --  2.9* 2.6*  CL 97 96* 78*  --  88* 86*  CO2 26 30 35*  --  31 35*  GLUCOSE 88 106* 132*  --  88 109*  BUN 19 18 47*  --  41* 38*  CREATININE 0.94 1.07* 3.37*  --  1.74* 1.55*  CALCIUM  9.0 9.1 7.1*  --  6.7* 6.9*  MG  --   --  2.8*  --  2.5* 2.4  PHOS  --   --   --  4.7* 4.0 3.2    CBC: Recent Labs  Lab 12/01/23 1307 12/05/23 0123 12/05/23 1705  WBC 4.1 7.0 5.5  NEUTROABS 2.3 4.8 3.7  HGB 11.6* 12.0 10.6*  HCT 36.6 38.2 34.6*  MCV 74.2* 74.8* 77.4*  PLT 289 350 253    Coagulation Studies: No results for input(s): LABPROT, INR in the last 72 hours.   MRI brain personally reviewed, agree with radiology:   no acute intracranial process   Current vital signs: BP 100/64 (BP Location: Left Arm)   Pulse 74   Temp 98.5 F (36.9 C) (Oral)   Resp 18   Ht 5' 11 (1.803 m)   Wt 85.5 kg   SpO2 96%   BMI 26.30 kg/m  Vital signs in last 24 hours: Temp:  [97.3 F (36.3 C)-99 F (37.2 C)] 98.5 F (36.9 C) (06/16 2102) Pulse Rate:  [59-95] 74 (06/16 2102) Resp:  [9-19] 18 (06/16 2102) BP: (92-118)/(59-79) 100/64 (06/16 2102) SpO2:  [88 %-100 %] 96 % (06/16 2102) Weight:  [85.5 kg] 85.5 kg (06/16 0110)

## 2023-12-05 NOTE — ED Provider Notes (Addendum)
 [place] EMERGENCY DEPARTMENT AT [facility] Provider Note   CSN: [id number] Arrival date & time: 12/05/23  0100     Patient presents with: Generalized Body Aches and Emesis   Chelsea Hansen is a 53 y.o. female.   The history is provided by the patient.  Emesis She has history of hypertension, hyperlipidemia, metastatic breast cancer and comes in with ongoing problems with nausea and vomiting.  She states that she has been vomiting for the last 2 weeks and has not been able to hold anything down.  She also relates no bowel movement for the last 3 weeks and also states she has not been passing any flatus.  Today, she is complaining of generalized cramping and feeling dizzy.  She also states that she is seeing 3 of everything.  She denies fever or chills.  She denies any focal weakness.   Prior to Admission medications   Medication Sig Start Date End Date Taking? Authorizing Provider  abemaciclib  (VERZENIO ) 50 MG tablet Take 1 tablet (50 mg total) by mouth 2 (two) times daily. 12/01/23   [name], [name], MD  albuterol  (VENTOLIN  HFA) 108 (90 Base) MCG/ACT inhaler Inhale 2 puffs by mouth into the lungs every 6 hours as needed for wheezing or shortness of breath. 03/18/23   Marius Siemens, NP  anastrozole  (ARIMIDEX ) 1 MG tablet Take 1 tablet (1 mg total) by mouth daily. 04/18/23   Causey, Laura Polio, NP  calcium -vitamin D (OSCAL WITH D) 500-5 MG-MCG tablet Take 1 tablet by mouth daily with supper. Patient not taking: Reported on 11/24/2023 06/24/22   [name], [name], MD  gabapentin  (NEURONTIN ) 300 MG capsule Take 1 capsule (300 mg total) by mouth 3 (three) times daily. 05/26/23   [name], [name], MD  montelukast  (SINGULAIR ) 10 MG tablet Take 1 tablet (10 mg total) by mouth at bedtime. Patient taking differently: Take 10 mg by mouth at bedtime as needed (for asthma flares or allergic symptoms). 05/01/23   Marius Siemens, NP  ondansetron  (ZOFRAN -ODT) 4 MG disintegrating tablet  Dissolve 1 tablet (4 mg total) by mouth every 8 (eight) hours as needed for nausea or vomiting. Patient taking differently: Take 4 mg by mouth every 8 (eight) hours as needed for nausea or vomiting (dissolve orally). 11/23/23   Scott, Angelita Kendall, PA-C  oxyCODONE  (OXYCONTIN ) 10 mg 12 hr tablet Take 1 tablet (10 mg total) by mouth every 12 (twelve) hours. 11/29/23   [name], Athena N, NP  oxyCODONE -acetaminophen  (PERCOCET) 7.5-325 MG tablet Take 1-2 tablets by mouth every 6 (six) hours as needed for severe pain (pain score 7-10). 12/01/23   [name], Giles Labrum, NP  polyethylene glycol (MIRALAX  / GLYCOLAX ) 17 g packet Take 17 g by mouth 2 (two) times daily. Patient not taking: Reported on 11/24/2023 06/30/22   [name], Hurman Maiden, MD  polyethylene glycol powder (GLYCOLAX /MIRALAX ) 17 GM/SCOOP powder Mix 17 g in 4 oz of liquid and take by mouth 2 (two) times daily as needed. 11/30/23   Marius Siemens, NP  prochlorperazine  (COMPAZINE ) 10 MG tablet Take 1 tablet (10 mg total) by mouth every 6 (six) hours as needed for nausea or vomiting. Patient not taking: Reported on 11/28/2023 11/26/23   Armenta Landau, MD  prochlorperazine  (COMPAZINE ) 10 MG tablet Take 1 tablet (10 mg total) by mouth every 6 (six) hours as needed for nausea or vomiting. 11/30/23   Marius Siemens, NP  propranolol  (INDERAL ) 10 MG tablet Take 1 tablet (10 mg total) by mouth 2 (  two) times daily. Please call [phone] to schedule an appointment for future refills. Thank you. 3rd attempt. Patient not taking: Reported on 11/24/2023 06/01/23   [name], [name], DO  senna (SENOKOT) 8.6 MG TABS tablet Take 2 tablets (17.2 mg total) by mouth at bedtime. Patient not taking: Reported on 11/24/2023 06/30/22   [name], Hurman Maiden, MD  sennosides-docusate sodium  (SENOKOT-S) 8.6-50 MG tablet Take 1 tablet by mouth daily. 11/30/23   Marius Siemens, NP    Allergies: Patient has no known allergies.    Review of Systems  Gastrointestinal:  Positive  for vomiting.  All other systems reviewed and are negative.   Updated Vital Signs BP 100/79   Pulse 79   Temp 97.9 F (36.6 C) (Oral)   Resp 19   Ht 5' 11 (1.803 m)   Wt 85.5 kg   SpO2 95%   BMI 26.30 kg/m   Physical Exam Vitals and nursing note reviewed.   53 year old female, resting comfortably and in no acute distress. Vital signs are normal. Oxygen saturation is 95%, which is normal. Head is normocephalic and atraumatic. PERRLA.  Patient is poorly cooperative for EOM exam, but full EOMs are noted.  She does report diplopia when looking at a light, but states that the relative position of the 2 images is constantly changing. Neck is nontender and supple without adenopathy.  There are no carotid bruits. Lungs are clear without rales, wheezes, or rhonchi. Chest is nontender. Heart has regular rate and rhythm without murmur. Abdomen is soft, flat, with minimal tenderness in the lower abdomen which is poorly localized.  There is no rebound or guarding. Extremities have no cyanosis or edema, full range of motion is present. Skin is warm and dry without rash. Neurologic: Awake and alert and oriented, cranial nerves are intact.  She is generally poorly cooperative for motor exam but strength is 4-5 in all 4 extremities and symmetric.  There is no pronator drift and there is no extinction on double simultaneous stimulation.  (all labs ordered are listed, but only abnormal results are displayed) Labs Reviewed  COMPREHENSIVE METABOLIC PANEL WITH GFR - Abnormal; Notable for the following components:      Result Value   Potassium 2.8 (*)    Chloride 78 (*)    CO2 35 (*)    Glucose, Bld 132 (*)    BUN 47 (*)    Creatinine, Ser 3.37 (*)    Calcium  7.1 (*)    Total Protein 8.8 (*)    AST 78 (*)    ALT 109 (*)    Alkaline Phosphatase 207 (*)    Total Bilirubin 1.4 (*)    GFR, Estimated 16 (*)    Anion gap 24 (*)    All other components within normal limits  CBC WITH  DIFFERENTIAL/PLATELET - Abnormal; Notable for the following components:   MCV 74.8 (*)    MCH 23.5 (*)    All other components within normal limits  MAGNESIUM  - Abnormal; Notable for the following components:   Magnesium  2.8 (*)    All other components within normal limits  URINALYSIS, ROUTINE W REFLEX MICROSCOPIC    EKG: EKG Interpretation Date/Time:  Monday December 05 2023 02:52:57 EDT Ventricular Rate:  68 PR Interval:  122 QRS Duration:  120 QT Interval:  556 QTC Calculation: 592 R Axis:   -15  Text Interpretation: Sinus rhythm IVCD, consider atypical RBBB Probable left ventricular hypertrophy Prolonged QT interval When compared with ECG of 11/24/2023, QT  has lengthened Confirmed by Alissa April ([id number]) on 12/05/2023 3:10:59 AM  Radiology: CT Head Wo Contrast Result Date: 12/05/2023 CLINICAL DATA:  History of breast cancer presenting with diplopia with dizziness and weakness. EXAM: CT HEAD WITHOUT CONTRAST TECHNIQUE: Contiguous axial images were obtained from the base of the skull through the vertex without intravenous contrast. RADIATION DOSE REDUCTION: This exam was performed according to the departmental dose-optimization program which includes automated exposure control, adjustment of the mA and/or kV according to patient size and/or use of iterative reconstruction technique. COMPARISON:  June 29, 2022 FINDINGS: Brain: No evidence of acute infarction, hemorrhage, hydrocephalus, extra-axial collection or mass lesion/mass effect. Vascular: No hyperdense vessel or unexpected calcification. Skull: Normal. Negative for fracture or focal lesion. Sinuses/Orbits: No acute finding. Other: None. IMPRESSION: No acute intracranial pathology. Electronically Signed   By: Virgle Grime M.D.   On: 12/05/2023 03:29     Procedures   Medications Ordered in the ED  potassium chloride  SA (KLOR-CON  M) CR tablet 40 mEq (has no administration in time range)  potassium chloride  10 mEq in 100 mL IVPB  (has no administration in time range)  lactated ringers  bolus 1,000 mL (has no administration in time range)  sodium chloride  0.9 % bolus 1,000 mL (1,000 mLs Intravenous New Bag/Given 12/05/23 0237)  prochlorperazine  (COMPAZINE ) injection 10 mg (10 mg Intravenous Given 12/05/23 0237)                                    Medical Decision Making Amount and/or Complexity of Data Reviewed Labs: ordered. Radiology: ordered.  Risk Prescription drug management. Decision regarding hospitalization.   Persistent nausea and vomiting.  Dizziness likely secondary to dehydration.  With ongoing vomiting, I am concerned about electrolyte disturbance-specifically hypokalemia and hypomagnesemia.  No focal findings to suggest stroke, but with unusual diplopia and history of cancer, I am ordering CT of head.  I have ordered IV fluids electrocardiogram, labs including potassium and magnesium .  I have reviewed her past records, and note hospital admission 11/24/2023-11/26/2023 for intractable nausea and vomiting.  At that time, she had better relief with prochlorperazine  than ondansetron , I have ordered IV prochlorperazine .  I do note comprehensive metabolic panel drawn on 12/01/2023 with potassium of 3.3.  I have reviewed her electrocardiogram, and my interpretation is prolonged QT interval which is new compared with 11/24/2023, consistent with electrolyte disturbance.   CT of head shows no acute process.  Have independently viewed the images, and agree with the radiologist's interpretation.  I have reviewed her laboratory tests, and my interpretation is acute kidney injury with creatinine 3.37 compared with 1.07 on 12/01/2023, moderate to severe hypokalemia with potassium 2.8 compared with 3.3 on 12/01/2023, elevated CO2 consistent with contraction alkalosis, elevated liver enzymes which have all progressed slightly compared with 12/01/2023, elevated random glucose, normal CBC.  Hemoglobin is normal today and was low on  6/12-this likely represents effects of dehydration.  Because of hypokalemia, I have ordered both oral and intravenous potassium and I ordered a magnesium  level.  Magnesium  level is actually slightly high at 2.8.  She does feel better following Compazine .  I have discussed case with Dr. Amy Kansky of [facility], who agrees to admit the patient.  CRITICAL CARE Performed by: Alissa April Total critical care time: 40 minutes Critical care time was exclusive of separately billable procedures and treating other patients. Critical care was necessary to treat or prevent imminent or life-threatening  deterioration. Critical care was time spent personally by me on the following activities: development of treatment plan with patient and/or surrogate as well as nursing, discussions with consultants, evaluation of patient's response to treatment, examination of patient, obtaining history from patient or surrogate, ordering and performing treatments and interventions, ordering and review of laboratory studies, ordering and review of radiographic studies, pulse oximetry and re-evaluation of patient's condition.  Final diagnoses:  Acute kidney injury (nontraumatic) (HCC)  Hypokalemia due to excessive gastrointestinal loss of potassium  Elevated liver function tests  Elevated random blood glucose level    ED Discharge Orders     None          Alissa April, MD 12/05/23 [id number]    Alissa April, MD 12/05/23 [phone]

## 2023-12-06 ENCOUNTER — Inpatient Hospital Stay (HOSPITAL_COMMUNITY)
Admit: 2023-12-06 | Discharge: 2023-12-06 | Disposition: A | Discharge status: Home / Self Care | Payer: MEDICAID | Attending: Internal Medicine | Admitting: Internal Medicine

## 2023-12-06 ENCOUNTER — Inpatient Hospital Stay (HOSPITAL_COMMUNITY): Payer: MEDICAID

## 2023-12-06 DIAGNOSIS — R569 Unspecified convulsions: Secondary | ICD-10-CM | POA: Diagnosis not present

## 2023-12-06 DIAGNOSIS — N179 Acute kidney failure, unspecified: Secondary | ICD-10-CM | POA: Diagnosis not present

## 2023-12-06 LAB — COMPREHENSIVE METABOLIC PANEL WITH GFR
ALT: 71 U/L — ABNORMAL HIGH (ref 0–44)
AST: 51 U/L — ABNORMAL HIGH (ref 15–41)
Albumin: 3.7 g/dL (ref 3.5–5.0)
Alkaline Phosphatase: 160 U/L — ABNORMAL HIGH (ref 38–126)
Anion gap: 14 (ref 5–15)
BUN: 28 mg/dL — ABNORMAL HIGH (ref 6–20)
CO2: 29 mmol/L (ref 22–32)
Calcium: 7 mg/dL — ABNORMAL LOW (ref 8.9–10.3)
Chloride: 89 mmol/L — ABNORMAL LOW (ref 98–111)
Creatinine, Ser: 1.04 mg/dL — ABNORMAL HIGH (ref 0.44–1.00)
GFR, Estimated: >60 mL/min (ref >60)
Glucose, Bld: 91 mg/dL (ref 70–99)
Potassium: 3.3 mmol/L — ABNORMAL LOW (ref 3.5–5.1)
Sodium: 132 mmol/L — ABNORMAL LOW (ref 135–145)
Total Bilirubin: 0.8 mg/dL (ref 0.0–1.2)
Total Protein: 7.2 g/dL (ref 6.5–8.1)

## 2023-12-06 LAB — CBC WITH DIFFERENTIAL/PLATELET
Abs Immature Granulocytes: 0.06 10*3/uL (ref 0.00–0.07)
Basophils Absolute: 0 10*3/uL (ref 0.0–0.1)
Basophils Relative: 1 %
Eosinophils Absolute: 0.1 10*3/uL (ref 0.0–0.5)
Eosinophils Relative: 2 %
HCT: 33.8 % — ABNORMAL LOW (ref 36.0–46.0)
Hemoglobin: 10.3 g/dL — ABNORMAL LOW (ref 12.0–15.0)
Immature Granulocytes: 1 %
Lymphocytes Relative: 19 %
Lymphs Abs: 0.9 10*3/uL (ref 0.7–4.0)
MCH: 23.3 pg — ABNORMAL LOW (ref 26.0–34.0)
MCHC: 30.5 g/dL (ref 30.0–36.0)
MCV: 76.5 fL — ABNORMAL LOW (ref 80.0–100.0)
Monocytes Absolute: 0.4 10*3/uL (ref 0.1–1.0)
Monocytes Relative: 10 %
Neutro Abs: 3 10*3/uL (ref 1.7–7.7)
Neutrophils Relative %: 67 %
Platelets: 220 10*3/uL (ref 150–400)
RBC: 4.42 MIL/uL (ref 3.87–5.11)
RDW: 14.2 % (ref 11.5–15.5)
WBC: 4.5 10*3/uL (ref 4.0–10.5)
nRBC: 0 % (ref 0.0–0.2)

## 2023-12-06 LAB — PHOSPHORUS: Phosphorus: 2.5 mg/dL (ref 2.5–4.6)

## 2023-12-06 LAB — LIPASE, BLOOD: Lipase: 35 U/L (ref 11–51)

## 2023-12-06 LAB — MAGNESIUM: Magnesium: 2.5 mg/dL — ABNORMAL HIGH (ref 1.7–2.4)

## 2023-12-06 MED ORDER — POTASSIUM CHLORIDE 10 MEQ/100ML IV SOLN
10.0000 meq | INTRAVENOUS | Status: AC
  Administered 2023-12-06 (×2): 10 meq via INTRAVENOUS
  Filled 2023-12-06 (×2): qty 100

## 2023-12-06 MED ORDER — BOOST / RESOURCE BREEZE PO LIQD CUSTOM: 1.0000 | Freq: Three times a day (TID) | ORAL | Status: DC

## 2023-12-06 MED ORDER — CALCIUM GLUCONATE-NACL 1-0.675 GM/50ML-% IV SOLN
1.0000 g | Freq: Once | INTRAVENOUS | Status: AC
  Administered 2023-12-06: 1000 mg via INTRAVENOUS
  Filled 2023-12-06: qty 50

## 2023-12-06 MED ORDER — LORAZEPAM 2 MG/ML IJ SOLN
0.5000 mg | Freq: Once | INTRAMUSCULAR | Status: AC | PRN
Start: 2023-12-06 — End: 2023-12-06
  Administered 2023-12-06: 0.5 mg via INTRAVENOUS
  Filled 2023-12-06: qty 1

## 2023-12-06 MED ORDER — ANASTROZOLE 1 MG PO TABS
1.0000 mg | ORAL_TABLET | Freq: Every day | ORAL | Status: DC
  Filled 2023-12-06 (×2): qty 1

## 2023-12-06 MED ORDER — LACTATED RINGERS IV SOLN: INTRAVENOUS | Status: AC

## 2023-12-06 MED ORDER — PROCHLORPERAZINE EDISYLATE 10 MG/2ML IJ SOLN
10.0000 mg | Freq: Four times a day (QID) | INTRAMUSCULAR | Status: DC | PRN
  Administered 2023-12-06 – 2023-12-07 (×3): 10 mg via INTRAVENOUS
  Filled 2023-12-06 (×3): qty 2

## 2023-12-06 MED ORDER — SCOPOLAMINE 1 MG/3DAYS TD PT72
1.0000 | MEDICATED_PATCH | TRANSDERMAL | Status: DC
  Administered 2023-12-06: 1.5 mg via TRANSDERMAL
  Filled 2023-12-06: qty 1

## 2023-12-06 MED ORDER — POTASSIUM CHLORIDE CRYS ER 20 MEQ PO TBCR
40.0000 meq | EXTENDED_RELEASE_TABLET | Freq: Two times a day (BID) | ORAL | Status: DC
  Administered 2023-12-07: 40 meq via ORAL
  Filled 2023-12-06 (×2): qty 2

## 2023-12-06 NOTE — Progress Notes (Signed)
 PROGRESS NOTE    Chelsea Hansen  ZOX:[id number] DOB: 14-May-1971 DOA: 12/05/2023 PCP: Marius Siemens, NP   Brief Narrative:  The patient is a overweight African-American female with a past medical history significant for but not limited too metastatic breast cancer to the bone status post neoadjuvant chemotherapy lumpectomy, and SLN biopsy, radiation who is on as anastrozole  and abemaciclib  with additional history including asthma, SVT, hypertension, hyperlipidemia, chronic cancer-related pain who was recently admitted from 6/5 until 6/7 with intractable nausea vomiting constipation treated symptomatically who now returns with continued intractable nausea vomiting and constipation.    She reported since discharge she has had difficulty eating and keeping her food down and having nausea vomiting.  She has been constipated and denied a bowel movement since her last discharge and continues to have intermittent diffuse abdominal pain.  Notably family witnessed seizure activity which abated on its own.  She is doing much better now and diet has been advanced to soft diet.  Labs are improving as well.  Nutritionist consulted and giving her supplementation.  Oncology was consulted given that she had questions about her PET CT scan and further steps about her breast cancer management.  Will obtain PT/OT Evaluation and anticipate D/C in the next 24-48 hours if tolerates diet.   Assessment and Plan:  Intractable nausea and vomiting  /  Constipation: Improving. Hx recent admission for similar symptoms 6/5 - 6/7 and improved with symptomatic treatment. Previous imaging with CT A/P on 6/4 with moderate stool burden but no acute findings; however PET on 6/5 did note stomach and proximal duodenal distension with bowel wall thickening in the proximal duodenum. Symptoms recurred after discharge and have continued to present, this admission noted to have associated AKI stage III, and electrolyte derangements below,  significant dehydration and contraction. Etiology possibly related to Abemaciclib  (CDK4/6 inhibitor) with high rate of N/V as adverse effect, ileus with associated constipation, possibly anatomical with question of SMA syndrome considering recent weight loss and recent PET imaging with gastric / prox duodenal distension, less likely gastroenteritis with prolonged symptoms, less likely CNS mets although would consider as possibility. Constipation may be opiate induced  -Checked KUB and showed a nonobstructive bowel gas pattern. -CT Abd/Pelvis done and showed Vague fat stranding along the proximal pancreas but lipase level was normal. She was also noted to have Small hiatal hernia, Colonic diverticulosis with no acute diverticulitis.Scattered appendicular and axial sclerotic metastatic osseous lesions. -S/p 2 L IV fluid, continue MIVF LR at 100 cc an hour until able to take more p.o. and will continued for another day - Symptomatic management: Ativan  as needed first-line antiemetic due to prolonged Qtc; can switch to Zofran  / compazine  when Qtc improves  - Bowel regimen ordered  - Clear liquid diet, advanced as tolerated; Now on SOFT Diet  - Nutrition evaluation and now on Supplement - Her Abemaciclib  will be held; Oncology consulted for futher evaluation and consideration stopping at time of discharge considering her significant GI symptoms.    Seizure: Family states that they witnessed her having a seizure episode on the night that she got hospitalized.  Patient states that she has had seizures in the past but is not on any medication.  Discussed with neurology and they recommend no antiepileptics at this time given that it is a provoked seizure likely in the setting of electrolyte abnormalities.  MRI of the brain done and showed no evidence of any intracranial abnormality.  EEG has been done and is pending read.  Continue seizure precautions.  Electrolytes are being repleted and she is improving and much  more awake and alert today  Acute kidney injury stage IIIa Baseline creatinine 0.8-0.9.  Elevated at 3.37 on admission.  With prerenal azotemia BUN elevated at 47.  Overall history and findings suggestive of prerenal injury possible conversion to intrinsic injury. BUN/Cr Trend significantly improved and she went from 47/3.37 -> 41/1.74 -> 28/1.08 -IVF as above.  Checking UA and still pending.  Will consider renal ultrasound if not improving however renal fxn is trending down nicely. Avoid Nephrotoxic Medications, Contrast Dyes, Hypotension and Dehydration to Ensure Adequate Renal Perfusion and will need to Renally Adjust Meds. CTM and Trend Renal Function carefully and repeat CMP in the AM    Multiple electrolyte abnormalities  Severe Hypocalcemia, symptomatic  Hypokalemia  Hypochloremia  Contraction Alkalosis  All presumed secondary to GI losses and dehydration  -C/w electrolyte Repletion, maintenance IV fluid per above  -Na+ is now 132, K+ is 3.3, Mag is 2.5, Phos 2.5, and Calcium  is 7.0.  -Repeat BMP mag Phos at 2 PM   Increased anion gap, possible mixed acid base disorder: Lactiic Normal. AG is resolved and improved.    Abnormal EKG  Hx episode of chest pain / SOB  Reports episode of CP / Sob prior to presentation. EKG with ischemic global ST depression.  Possible demand ischemia in the setting of hypovolemia -Checked high-sensitivity troponin 7; No further Chest Pain    Prolonged Qtc: Qtc was 592, in setting of hypokalemia, hypocalcemia ; Serial electrolyte repletion's.Avoid QT prolonging medications. Repeat EKG showed improvement of Qtc to 470   Acute liver injury, hepatocellular pattern: Chronic elevation of Alk phos likely secondary to bone mets. Mild elevation in transaminase with AST 78, ALT 109, T Bili 1.4. Alk phos chronic elevation currently 207 likely from bone. Suspect transaminase elevation I/s/o systemic illness per above, possibly related to Verzenio . AST went from 78 ->  57 -> 51. ALT went from 109 -> 79 -> 71. Continue to Monitor and Trend LFT, check lipase with GI symptoms     Metastatic breast CA to bone: s/p neoadjuvant chemo, lumpectomy and SLN Bx, radiation, now on Anastrazole and Abemaciclib ; Follows with Oncology Dr. [name] and NP Debbie Fails, added to treatment team. See above re: possible GI AE of Abemaciclib  would discuss discontinuation at time of discharge as it has been held here. Oncology consulted for further evaluation  Asthma: Albuterol  prn, continue home singulair ; CTM respiratory Status carefully  SVT: Not taking propranolol  prior to admission, continue to hold   Essential HTN: antiHTN have been discontinued last admission. CTM BP per Protocol. Last BP  HLD: Not on cholestrol lowering agents   Chronic malignancy related pain: Reduce Gabapentin  to 300 mg BID in setting of renal injury. Continue home Oxycontin  10 mg q 12 hr, and Oxycodone  7.5 / 15 mg for mod/severe q 6 hr prn.   Pulmonary nodules: Noted on outpatient imaging, without PET uptake, continue outpatient followup    DVT prophylaxis: SCDs Start: 12/05/23 0444    Code Status: Full Code Family Communication: D/w Family present @ bedside  Disposition Plan:  Level of care: Telemetry Status is: Inpatient Remains inpatient appropriate because:    Consultants:  None  Procedures:  As delineated as above  Antimicrobials:  Anti-infectives (From admission, onward)    None       Subjective: Seen and examined at bedside and she is much more awake and alert compared to yesterday.  Labs are improving.  States that she is hungry and states that she has not had a bowel movement in over a week but she had been vomiting.  No chest pain or shortness of breath.  Asking for her diet to be advanced.  No other concerns or complaints this time.  Objective: Vitals:   12/05/23 1638 12/05/23 2102 12/06/23 0608 12/06/23 1324  BP: (!) 95/59 100/64 93/63 113/72  Pulse: 70 74 64 64  Resp: 18 18  18 18   Temp: (!) 97.3 F (36.3 C) 98.5 F (36.9 C) 98.4 F (36.9 C) 98 F (36.7 C)  TempSrc: Oral Oral Oral Oral  SpO2: 100% 96% 100% 97%  Weight:      Height:        Intake/Output Summary (Last 24 hours) at 12/06/2023 2044 Last data filed at 12/05/2023 2250 Gross per 24 hour  Intake 3 ml  Output --  Net 3 ml   Filed Weights   12/05/23 0110  Weight: 85.5 kg   Examination: Physical Exam:  Constitutional: WN/WD overweight African-American female who is chronically appearing in no acute distress Respiratory: Diminished to auscultation bilaterally, no wheezing, rales, rhonchi or crackles. Normal respiratory effort and patient is not tachypenic. No accessory muscle use.  Unlabored breathing Cardiovascular: RRR, no murmurs / rubs / gallops. S1 and S2 auscultated. No extremity edema. Abdomen: Soft, non-tender, distended secondary to body habitus bowel sounds positive.  GU: Deferred. Musculoskeletal: No clubbing / cyanosis of digits/nails. No joint deformity upper and lower extremities.  Skin: No rashes, lesions, ulcers on limited skin evaluation. No induration; Warm and dry.  Neurologic: CN 2-12 grossly intact with no focal deficits. Romberg sign and cerebellar reflexes not assessed.  Psychiatric: Normal judgment and insight. Alert and oriented x 3. Normal mood and appropriate affect.   Data Reviewed: I have personally reviewed following labs and imaging studies  CBC: Recent Labs  Lab 12/01/23 1307 12/05/23 0123 12/05/23 1705 12/06/23 0638  WBC 4.1 7.0 5.5 4.5  NEUTROABS 2.3 4.8 3.7 3.0  HGB 11.6* 12.0 10.6* 10.3*  HCT 36.6 38.2 34.6* 33.8*  MCV 74.2* 74.8* 77.4* 76.5*  PLT 289 350 253 220   Basic Metabolic Panel: Recent Labs  Lab 12/01/23 1307 12/05/23 0123 12/05/23 0531 12/05/23 1416 12/05/23 1705 12/06/23 0638  NA 140 137  --  135 134* 132*  K 3.3* 2.8*  --  2.9* 2.6* 3.3*  CL 96* 78*  --  88* 86* 89*  CO2 30 35*  --  31 35* 29  GLUCOSE 106* 132*  --  88  109* 91  BUN 18 47*  --  41* 38* 28*  CREATININE 1.07* 3.37*  --  1.74* 1.55* 1.04*  CALCIUM  9.1 7.1*  --  6.7* 6.9* 7.0*  MG  --  2.8*  --  2.5* 2.4 2.5*  PHOS  --   --  4.7* 4.0 3.2 2.5   GFR: Estimated Creatinine Clearance: 77.5 mL/min (A) (by C-G formula based on SCr of 1.04 mg/dL (H)). Liver Function Tests: Recent Labs  Lab 11/30/23 1000 12/01/23 1307 12/05/23 0123 12/05/23 1705 12/06/23 0638  AST 27 43* 78* 57* 51*  ALT 45* 53* 109* 79* 71*  ALKPHOS 223* 191* 207* 163* 160*  BILITOT 0.3 0.6 1.4* 0.8 0.8  PROT 7.3 8.4* 8.8* 7.0 7.2  ALBUMIN 4.5 4.5 4.7 3.6 3.7   Recent Labs  Lab 12/05/23 0819 12/06/23 0635  LIPASE 37 35   No results for input(s): AMMONIA in the last 168 hours. Coagulation Profile: No  results for input(s): INR, PROTIME in the last 168 hours. Cardiac Enzymes: No results for input(s): CKTOTAL, CKMB, CKMBINDEX, TROPONINI in the last 168 hours. BNP (last 3 results) No results for input(s): PROBNP in the last 8760 hours. HbA1C: No results for input(s): HGBA1C in the last 72 hours. CBG: No results for input(s): GLUCAP in the last 168 hours. Lipid Profile: No results for input(s): CHOL, HDL, LDLCALC, TRIG, CHOLHDL, LDLDIRECT in the last 72 hours. Thyroid  Function Tests: Recent Labs    12/05/23 0819  TSH 2.576   Anemia Panel: No results for input(s): VITAMINB12, FOLATE, FERRITIN, TIBC, IRON, RETICCTPCT in the last 72 hours. Sepsis Labs: Recent Labs  Lab 12/05/23 0531  LATICACIDVEN 1.0   No results found for this or any previous visit (from the past 240 hours).   Radiology Studies: CT ABDOMEN PELVIS WO CONTRAST Result Date: 12/06/2023 CLINICAL DATA:  Abdominal pain, acute, nonlocalized Nausea and Vomiting Hx recent admission for similar symptoms 6/5 - 6/7 and improved with symptomatic treatment. Previous imaging with CT A/P on 6/4 with moderate stool burden but no acute findings; however PET on 6/5  did note stomach and proximal duodenal EXAM: CT ABDOMEN AND PELVIS WITHOUT CONTRAST TECHNIQUE: Multidetector CT imaging of the abdomen and pelvis was performed following the standard protocol without IV contrast. RADIATION DOSE REDUCTION: This exam was performed according to the departmental dose-optimization program which includes automated exposure control, adjustment of the mA and/or kV according to patient size and/or use of iterative reconstruction technique. COMPARISON:  PET CT 11/24/2023 ,CT abdomen pelvis 11/23/2023 FINDINGS: Lower chest: Small hiatal hernia.  No acute abnormality. Hepatobiliary: No focal lesion. No gallstones, gallbladder wall thickening, or pericholecystic fluid. No biliary dilatation. Pancreas: Vague fat stranding along the proximal pancreas. No main pancreatic ductal dilatation. Spleen: Normal in size without focal abnormality. Splenule is noted. Adrenals/Urinary Tract: No adrenal nodule bilaterally. No nephrolithiasis and no hydronephrosis. No definite contour-deforming renal mass. No ureterolithiasis or hydroureter. The urinary bladder is unremarkable. Stomach/Bowel: Stomach is within normal limits. No evidence of bowel wall thickening or dilatation. Colonic diverticulosis. Appendix appears normal. Vascular/Lymphatic: No abdominal aorta or iliac aneurysm. No abdominal, pelvic, or inguinal lymphadenopathy. Reproductive: Status post hysterectomy. No adnexal masses. Other: No intraperitoneal free fluid. No intraperitoneal free gas. No organized fluid collection. Musculoskeletal: No abdominal wall hernia or abnormality. Scattered appendicular and axial skeleton metastatic osseous lesions. No acute displaced fracture. Chronic compression fracture of the L3 level with likely underlying vertebral body hemangioma. Multilevel degenerative changes of the spine. IMPRESSION: 1. Vague fat stranding along the proximal pancreas. Recommend correlation with lipase levels. 2. Small hiatal hernia. 3.  Colonic diverticulosis with no acute diverticulitis. 4. Scattered appendicular and axial sclerotic metastatic osseous lesions. Electronically Signed   By: Morgane  Naveau M.D.   On: 12/06/2023 00:47   DG CHEST PORT 1 VIEW Result Date: 12/05/2023 CLINICAL DATA:  SOB EXAM: PORTABLE CHEST 1 VIEW COMPARISON:  01/28/2021. FINDINGS: The heart size and mediastinal contours are within normal limits. Both lungs are clear. The visualized skeletal structures are unremarkable. IMPRESSION: No active disease. Electronically Signed   By: Sydell Eva M.D.   On: 12/05/2023 19:25   MR BRAIN WO CONTRAST Result Date: 12/05/2023 CLINICAL DATA:  Mental status change, unknown cause EXAM: MRI HEAD WITHOUT CONTRAST TECHNIQUE: Multiplanar, multiecho pulse sequences of the brain and surrounding structures were obtained without intravenous contrast. COMPARISON:  Same day CT head. FINDINGS: Brain: No acute infarction, hemorrhage, hydrocephalus, extra-axial collection or mass lesion. Mild to moderate scattered  T2/FLAIR hyperintensities the white matter, nonspecific but compatible with chronic microvascular ischemic change. Vascular: Major arterial flow voids are maintained at the skull base. Skull and upper cervical spine: Normal marrow signal. Sinuses/Orbits: Right maxillary sinus retention cyst. Otherwise, largely clear sinuses. No acute orbital findings. Other: No mastoid effusions. IMPRESSION: No evidence of acute intracranial abnormality. Electronically Signed   By: Stevenson Elbe M.D.   On: 12/05/2023 18:46   DG Abd 1 View Result Date: 12/05/2023 CLINICAL DATA:  Nausea and vomiting. EXAM: ABDOMEN - 1 VIEW COMPARISON:  11/01/2013 FINDINGS: Supine abdomen shows no gaseous bowel dilatation to suggest obstruction. No unexpected abdominopelvic calcification. Degenerative changes are noted in the lumbar spine. IMPRESSION: Nonobstructive bowel gas pattern. Electronically Signed   By: Donnal Fusi M.D.   On: 12/05/2023 05:16    CT Head Wo Contrast Result Date: 12/05/2023 CLINICAL DATA:  History of breast cancer presenting with diplopia with dizziness and weakness. EXAM: CT HEAD WITHOUT CONTRAST TECHNIQUE: Contiguous axial images were obtained from the base of the skull through the vertex without intravenous contrast. RADIATION DOSE REDUCTION: This exam was performed according to the departmental dose-optimization program which includes automated exposure control, adjustment of the mA and/or kV according to patient size and/or use of iterative reconstruction technique. COMPARISON:  June 29, 2022 FINDINGS: Brain: No evidence of acute infarction, hemorrhage, hydrocephalus, extra-axial collection or mass lesion/mass effect. Vascular: No hyperdense vessel or unexpected calcification. Skull: Normal. Negative for fracture or focal lesion. Sinuses/Orbits: No acute finding. Other: None. IMPRESSION: No acute intracranial pathology. Electronically Signed   By: Virgle Grime M.D.   On: 12/05/2023 03:29   Scheduled Meds:  anastrozole   1 mg Oral Daily   bisacodyl   10 mg Rectal Once   Chlorhexidine  Gluconate Cloth  6 each Topical Daily   feeding supplement  1 Container Oral TID BM   gabapentin   300 mg Oral BID   oxyCODONE   10 mg Oral Q12H   polyethylene glycol  17 g Oral BID   potassium chloride   40 mEq Oral BID   scopolamine   1 patch Transdermal Q72H   senna  2 tablet Oral QHS   sodium chloride  flush  3 mL Intravenous Q12H   Continuous Infusions:  calcium  gluconate     lactated ringers  100 mL/hr at 12/06/23 1856    LOS: 1 day   Aura Leeds, DO [facility] Available via Epic secure chat 7am-7pm After these hours, please refer to coverage provider listed on [email or web address] 12/06/2023, 8:44 PM

## 2023-12-06 NOTE — Progress Notes (Addendum)
 Chelsea Hansen   DOB:23-Nov-1970   ZO#:[id number]      ASSESSMENT & PLAN:  Karlita Lichtman is a 53 year old patient with oncologic history significant for left breast cancer.  Admitted on 12/05/2023 with complaints of generalized body aches, nausea, and constipation.  Follows with Dr. [name].  Breast cancer, left breast, ER + PR + HER2- -Initially diagnosed as a stage Ia 12/17/2020.  Restaged as stage IIa in 2023. - Status post neoadjuvant chemotherapy with doxorubicin  and cyclophosphamide , followed by weekly Taxol .  Status post right lumpectomy 07/09/2021.  Followed by adjuvant radiation therapy. - Given antiestrogen oral therapy with letrozole , then changed to anastrozole .  Also currently on Verzenio . -Last PET scan 11/24/2023 shows sclerotic bone mets increasing appearance.  Nodal disease showed significant improvement.  Tiny lung nodules right upper lobe. - Last seen 12/01/2023 in outpatient oncology clinic.  - Follows with medical oncology/Dr. [name].  Dr. Rosaline Coma providing coverage at this time.  Nausea/vomiting Hypokalemia Constipation, chronic -Patient reports inability to have bowel movement x 3 days.  Continue stool softeners and laxatives as ordered - Nausea and vomiting improving - Continue antiemetics and electrolyte repletion as ordered - Continue supportive care  AKI  -Admitted with elevated creatinine 3.37 and BUN 47 - Decreasing nicely with initiation of IV fluids - Avoid nephrotoxic agents - Continue to monitor CMP   Anemia - Hemoglobin 10.3 today - Baseline 10-11 range - No transfusional intervention required at this time - Continue to monitor CBC with differential   Code Status Full  Subjective:  Patient seen awake and alert sitting in bed.  Reports that she feels terrible, upon questioning she clarifies cramps in her legs which she notes is improving at this time and admits to constipation since last BM on Saturday.  Also notes nausea somewhat improved.  Disappointed  Dr. Lee Public is away because wants him to review all her scans done recently.  Asking when will she be discharged.    Objective:   Intake/Output Summary (Last 24 hours) at 12/06/2023 1952 Last data filed at 12/05/2023 2250 Gross per 24 hour  Intake 3 ml  Output --  Net 3 ml     PHYSICAL EXAMINATION: ECOG PERFORMANCE STATUS: 2 - Symptomatic, <50% confined to bed  Vitals:   12/06/23 0608 12/06/23 1324  BP: 93/63 113/72  Pulse: 64 64  Resp: 18 18  Temp: 98.4 F (36.9 C) 98 F (36.7 C)  SpO2: 100% 97%   Filed Weights   12/05/23 0110  Weight: 188 lb 9.6 oz (85.5 kg)    GENERAL: alert, no distress and comfortable SKIN: skin color, texture, turgor are normal, no rashes or significant lesions EYES: normal, conjunctiva are pink and non-injected, sclera clear OROPHARYNX: no exudate, no erythema and lips, buccal mucosa, and tongue normal  NECK: supple, thyroid  normal size, non-tender, without nodularity LYMPH: no palpable lymphadenopathy in the cervical, axillary or inguinal LUNGS: clear to auscultation and percussion with normal breathing effort HEART: regular rate & rhythm and no murmurs and no lower extremity edema ABDOMEN: abdomen soft, non-tender and normal bowel sounds MUSCULOSKELETAL: no cyanosis of digits and no clubbing  PSYCH: alert & oriented x 3 with fluent speech NEURO: no focal motor/sensory deficits   All questions were answered. The patient knows to call the clinic with any problems, questions or concerns.   The total time spent in the appointment was 40 minutes encounter with patient including review of chart and various tests results, discussions about plan of care and coordination of care plan  Rogerio Clay IV, MD 12/06/2023 7:52 PM    Labs Reviewed:  Lab Results  Component Value Date   WBC 4.5 12/06/2023   HGB 10.3 (L) 12/06/2023   HCT 33.8 (L) 12/06/2023   MCV 76.5 (L) 12/06/2023   PLT 220 12/06/2023   Recent Labs    12/05/23 0123 12/05/23 1416  12/05/23 1705 12/06/23 0638  NA 137 135 134* 132*  K 2.8* 2.9* 2.6* 3.3*  CL 78* 88* 86* 89*  CO2 35* 31 35* 29  GLUCOSE 132* 88 109* 91  BUN 47* 41* 38* 28*  CREATININE 3.37* 1.74* 1.55* 1.04*  CALCIUM  7.1* 6.7* 6.9* 7.0*  GFRNONAA 16* 35* 40* >60  PROT 8.8*  --  7.0 7.2  ALBUMIN 4.7  --  3.6 3.7  AST 78*  --  57* 51*  ALT 109*  --  79* 71*  ALKPHOS 207*  --  163* 160*  BILITOT 1.4*  --  0.8 0.8    Studies Reviewed:  CT ABDOMEN PELVIS WO CONTRAST Result Date: 12/06/2023 CLINICAL DATA:  Abdominal pain, acute, nonlocalized Nausea and Vomiting Hx recent admission for similar symptoms 6/5 - 6/7 and improved with symptomatic treatment. Previous imaging with CT A/P on 6/4 with moderate stool burden but no acute findings; however PET on 6/5 did note stomach and proximal duodenal EXAM: CT ABDOMEN AND PELVIS WITHOUT CONTRAST TECHNIQUE: Multidetector CT imaging of the abdomen and pelvis was performed following the standard protocol without IV contrast. RADIATION DOSE REDUCTION: This exam was performed according to the departmental dose-optimization program which includes automated exposure control, adjustment of the mA and/or kV according to patient size and/or use of iterative reconstruction technique. COMPARISON:  PET CT 11/24/2023 ,CT abdomen pelvis 11/23/2023 FINDINGS: Lower chest: Small hiatal hernia.  No acute abnormality. Hepatobiliary: No focal lesion. No gallstones, gallbladder wall thickening, or pericholecystic fluid. No biliary dilatation. Pancreas: Vague fat stranding along the proximal pancreas. No main pancreatic ductal dilatation. Spleen: Normal in size without focal abnormality. Splenule is noted. Adrenals/Urinary Tract: No adrenal nodule bilaterally. No nephrolithiasis and no hydronephrosis. No definite contour-deforming renal mass. No ureterolithiasis or hydroureter. The urinary bladder is unremarkable. Stomach/Bowel: Stomach is within normal limits. No evidence of bowel wall  thickening or dilatation. Colonic diverticulosis. Appendix appears normal. Vascular/Lymphatic: No abdominal aorta or iliac aneurysm. No abdominal, pelvic, or inguinal lymphadenopathy. Reproductive: Status post hysterectomy. No adnexal masses. Other: No intraperitoneal free fluid. No intraperitoneal free gas. No organized fluid collection. Musculoskeletal: No abdominal wall hernia or abnormality. Scattered appendicular and axial skeleton metastatic osseous lesions. No acute displaced fracture. Chronic compression fracture of the L3 level with likely underlying vertebral body hemangioma. Multilevel degenerative changes of the spine. IMPRESSION: 1. Vague fat stranding along the proximal pancreas. Recommend correlation with lipase levels. 2. Small hiatal hernia. 3. Colonic diverticulosis with no acute diverticulitis. 4. Scattered appendicular and axial sclerotic metastatic osseous lesions. Electronically Signed   By: Morgane  Naveau M.D.   On: 12/06/2023 00:47   DG CHEST PORT 1 VIEW Result Date: 12/05/2023 CLINICAL DATA:  SOB EXAM: PORTABLE CHEST 1 VIEW COMPARISON:  01/28/2021. FINDINGS: The heart size and mediastinal contours are within normal limits. Both lungs are clear. The visualized skeletal structures are unremarkable. IMPRESSION: No active disease. Electronically Signed   By: Sydell Eva M.D.   On: 12/05/2023 19:25   MR BRAIN WO CONTRAST Result Date: 12/05/2023 CLINICAL DATA:  Mental status change, unknown cause EXAM: MRI HEAD WITHOUT CONTRAST TECHNIQUE: Multiplanar, multiecho pulse sequences of the brain  and surrounding structures were obtained without intravenous contrast. COMPARISON:  Same day CT head. FINDINGS: Brain: No acute infarction, hemorrhage, hydrocephalus, extra-axial collection or mass lesion. Mild to moderate scattered T2/FLAIR hyperintensities the white matter, nonspecific but compatible with chronic microvascular ischemic change. Vascular: Major arterial flow voids are maintained at  the skull base. Skull and upper cervical spine: Normal marrow signal. Sinuses/Orbits: Right maxillary sinus retention cyst. Otherwise, largely clear sinuses. No acute orbital findings. Other: No mastoid effusions. IMPRESSION: No evidence of acute intracranial abnormality. Electronically Signed   By: Stevenson Elbe M.D.   On: 12/05/2023 18:46   DG Abd 1 View Result Date: 12/05/2023 CLINICAL DATA:  Nausea and vomiting. EXAM: ABDOMEN - 1 VIEW COMPARISON:  11/01/2013 FINDINGS: Supine abdomen shows no gaseous bowel dilatation to suggest obstruction. No unexpected abdominopelvic calcification. Degenerative changes are noted in the lumbar spine. IMPRESSION: Nonobstructive bowel gas pattern. Electronically Signed   By: Donnal Fusi M.D.   On: 12/05/2023 05:16   CT Head Wo Contrast Result Date: 12/05/2023 CLINICAL DATA:  History of breast cancer presenting with diplopia with dizziness and weakness. EXAM: CT HEAD WITHOUT CONTRAST TECHNIQUE: Contiguous axial images were obtained from the base of the skull through the vertex without intravenous contrast. RADIATION DOSE REDUCTION: This exam was performed according to the departmental dose-optimization program which includes automated exposure control, adjustment of the mA and/or kV according to patient size and/or use of iterative reconstruction technique. COMPARISON:  June 29, 2022 FINDINGS: Brain: No evidence of acute infarction, hemorrhage, hydrocephalus, extra-axial collection or mass lesion/mass effect. Vascular: No hyperdense vessel or unexpected calcification. Skull: Normal. Negative for fracture or focal lesion. Sinuses/Orbits: No acute finding. Other: None. IMPRESSION: No acute intracranial pathology. Electronically Signed   By: Virgle Grime M.D.   On: 12/05/2023 03:29   NM PET Image Restag (PS) Skull Base To Thigh Result Date: 12/01/2023 CLINICAL DATA:  Subsequent treatment strategy for metastatic breast cancer. EXAM: NUCLEAR MEDICINE PET SKULL BASE  TO THIGH TECHNIQUE: 9.92 mCi F-18 FDG was injected intravenously. Full-ring PET imaging was performed from the skull base to thigh after the radiotracer. CT data was obtained and used for attenuation correction and anatomic localization. Fasting blood glucose: 100 mg/dl COMPARISON:  PET-CT 69/62/9528. CT scan chest abdomen pelvis 11/23/2023. FINDINGS: Mediastinal blood pool activity: SUV max 3.2 Liver activity: SUV max 3.8 NECK: No specific abnormal uptake seen above blood pool in the neck including lymph node change of the submandibular, posterior triangle or internal jugular region. The previous hypermetabolic submandibular node is no longer identified. Near symmetric uptake of radiotracer along the visualized intracranial compartment. Incidental CT findings: The submandibular glands, parotid glands and thyroid  gland are unremarkable. There is some small less than 1 cm in size short axis nodes in the neck, not pathologic by size criteria and similar to previous. Right maxillary sinus mucous retention cyst or polyp. Jewelry along the tongue. CHEST: No specific abnormal radiotracer uptake seen above blood pool in the axillary region, hilum or mediastinum. Previous areas of uptake and thoracic nodal enlargement are improved from the prior PET-CT but were described as being improved on the more recent CT scan of the chest abdomen and pelvis. There is some nonspecific mild uptake along the lower esophagus. Please correlate with any symptoms. No abnormal lung uptake identified this time. Incidental CT findings: Stable cardiac size. No pericardial effusion. The thoracic aorta is normal course and caliber. Small hiatal hernia. Small area of calcification along the SVC near the junction to the azygous.  Breathing motion. No consolidation, pneumothorax or effusion. As seen previously there are several scattered tiny nodules identified in the lungs particularly the right upper lobe in a similar distribution to the recent  prior examination. These appear progressive from the older CT scan as part of the PET of December 2024. No associated abnormal uptake but again these lesions are quite small. Please correlate for any clinical presentation otherwise would recommend short follow-up. ABDOMEN/PELVIS: There is physiologic distribution radiotracer along the parenchymal organs, bowel and renal collecting systems. On prior PET-CT there are some mildly enlarged hypermetabolic upper retroperitoneal nodes which again has shown improvement on the more recent CT scan and today do not show any abnormal residual uptake. There are some right inguinal nodes which show uptake today however. Example has maximum SUV value of 6.3 and on image 188 node measures 16 by 15 mm. This same node in December 2024 would measured 14 x 13 mm the and had maximum SUV of only 1.4. A new area of disease is possible but there is a differential. Incidental CT findings: Grossly the liver, spleen, adrenal glands and pancreas are unremarkable. Gallbladder is present. There is some contrast within the renal collecting systems the kidneys but this could relate to the recent CT scan of chest abdomen and pelvis. Contrast in the bladder which is relatively contracted. Normal caliber aorta and IVC. Bowel has a normal course and caliber. Scattered colonic stool. Scattered colonic diverticula. The small bowel is nondilated. Stomach is distended with fluid and debris. Questionable stranding in the area of the second portion the duodenum. There is a caliber change in the bowel this location. Please correlate with clinical findings. There is no specific abnormal uptake in this location. SKELETON: Once again there is some scattered sclerotic bone metastases identified particularly along the spine and pelvis. Additional areas along the sternum as well as the clavicles. By CT distribution is similar. Few areas are larger such as along the sternal region on image 88 compared to the study  of December 2024. Is also some mild patchy marrow uptake identified in several areas which are increased such as along the left clavicular head with maximum SUV of 5.3. Sternal area on image 76 of 3.5 SUV maximum. T12 vertebral body maximum SUV value 5.3. Other examples as well of more uptake and more sclerosis. Incidental CT findings: Scattered degenerative changes identified. Curvature of the spine. Compression deformity of L3. IMPRESSION: Sclerotic bone metastases again identified. Several areas of sclerosis of increased in appearance compared to December 2024 and show more uptake. Distribution of disease is similar. Stable compression of L3 vertebral body. The previous uptake and increased size nodal disease on the December 2024 study has shown significant improvement. There is 1 lymph node in the right inguinal region which is slightly larger and shows some increased uptake. This could be a metastatic node but does have a differential. Please correlate with clinical findings. Once again several tiny lung nodules identified particularly in the right upper lobe. These do not show abnormal uptake. Please correlate for clinical presentation. This would have a differential and recommend attention on close follow-up. Distended stomach with fluid and debris with a caliber change at the proximal duodenum. There is slight soft tissue thickening this location without abnormal uptake. Please correlate with specific symptoms. Further workup as clinically appropriate. Electronically Signed   By: Adrianna Horde M.D.   On: 12/01/2023 15:04   CT Angio Chest PE W and/or Wo Contrast Result Date: 11/23/2023 CLINICAL DATA:  Chest  pain, shortness of breath EXAM: CT ANGIOGRAPHY CHEST WITH CONTRAST TECHNIQUE: Multidetector CT imaging of the chest was performed using the standard protocol during bolus administration of intravenous contrast. Multiplanar CT image reconstructions and MIPs were obtained to evaluate the vascular anatomy.  RADIATION DOSE REDUCTION: This exam was performed according to the departmental dose-optimization program which includes automated exposure control, adjustment of the mA and/or kV according to patient size and/or use of iterative reconstruction technique. CONTRAST:  OMNIPAQUE  IOHEXOL  350 MG/ML SOLN COMPARISON:  None Available. FINDINGS: Cardiovascular: Satisfactory opacification of the pulmonary arteries to the segmental level. No evidence of pulmonary embolism. Normal heart size. No pericardial effusion. Mediastinum/Nodes: No enlarged mediastinal, hilar, or axillary lymph nodes. Thyroid  gland, trachea, and esophagus demonstrate no significant findings. Lungs/Pleura: There is an ill-defined nodular pattern throughout both lungs with small small nodular densities that measured between a few mm up to 4 mm bilaterally. These findings in combination with the previously described findings of metastatic bone disease raises the possibility of metastatic lung disease. There is no other infiltrates or consolidations or pleural effusions. Upper Abdomen: No acute abnormality. Musculoskeletal: Diffuse sclerotic metastatic bone disease of the thoracic spine, sternum and ribs. Review of the MIP images confirms the above findings. IMPRESSION: *No evidence of pulmonary embolism. *Diffuse sclerotic metastatic bone disease. *Ill-defined nodular pattern throughout both lungs with small nodular densities that measured between a few mm up to 4 mm bilaterally. These findings in combination with the previously described findings of metastatic bone disease raises the possibility of metastatic lung disease. Electronically Signed   By: Fredrich Jefferson M.D.   On: 11/23/2023 15:20   CT ABDOMEN PELVIS W CONTRAST Result Date: 11/23/2023 CLINICAL DATA:  Abdominal pain EXAM: CT ABDOMEN AND PELVIS WITH CONTRAST TECHNIQUE: Multidetector CT imaging of the abdomen and pelvis was performed using the standard protocol following bolus  administration of intravenous contrast. RADIATION DOSE REDUCTION: This exam was performed according to the departmental dose-optimization program which includes automated exposure control, adjustment of the mA and/or kV according to patient size and/or use of iterative reconstruction technique. CONTRAST:  OMNIPAQUE  IOHEXOL  350 MG/ML SOLN COMPARISON:  CT abdomen and pelvis August 23, 2022 FINDINGS: Lower chest: No infiltrates or consolidations, no pleural effusions Hepatobiliary: Liver normal size no masses no biliary dilatation. Gallbladder unremarkable. No gallstones. Pancreas: Pancreas normal size. No masses calcifications or inflammatory changes. Spleen: Spleen normal size.  No masses. Adrenals/Urinary Tract: Adrenal glands are normal size. Follow-up recommended. Kidneys are normal. No masses calcifications or hydronephrosis Stomach/Bowel: No small or large bowel obstruction or inflammatory changes. Diffuse pan diverticulosis unchanged since prior. Moderate amount of residual fecal material throughout the colon without obstruction or constipation. Vascular/Lymphatic: No significant vascular findings are present. No enlarged abdominal or pelvic lymph nodes. Reproductive: .  No masses. Bladder unremarkable. Other: Anterior abdominal wall unremarkable without evidence of umbilical or inguinal hernias Musculoskeletal: Again noted multiple sclerotic lesions involving the thoracolumbar spine with fracture deformity of L3 correlates with metastatic bone disease. IMPRESSION: *No acute findings in the abdomen or pelvis. *Diffuse pan diverticulosis unchanged since prior. *Moderate amount of residual fecal material throughout the colon without obstruction or constipation. *Again noted multiple sclerotic lesions involving the thoracolumbar spine with fracture deformity of L3 correlates with metastatic bone disease. Electronically Signed   By: Fredrich Jefferson M.D.   On: 11/23/2023 15:17   I have read the above note and  personally examined the patient. I agree with the assessment and plan as noted above.  Briefly  Mrs. Cherisa Brucker is a 53 year old female with metastatic ER/PR positive breast cancer currently under the care of Dr. Sheri Dillon.  She is currently admitted with nausea, vomiting, as well as hypokalemia and constipation.  She has an AKI.  Our team was consulted for further evaluation and management.  Patient is currently on anastrozole  and Verzenio .  She has been on this therapy since 2023.  Most recent PET CT scan that showed sclerotic bone mets that appear to be increasing in FDG avidity, though not grossly increased in number.  Nodal involvement showed significant improvement with small lung nodules noted.  Discussed the results of the PET CT scan with the patient.  Noted that Dr. Lee Public is currently out of the office and will be back next week.  Noted that he could review her scan and decide for next steps moving forward.  In the short-term agree with primary team's management of electrolyte abnormalities and AKI.  Patient voiced understanding of our findings and plan moving forward.   Rogerio Clay, MD Department of Hematology/Oncology [facility] Phone: [phone] Pager: [phone] Email: [email or web address]

## 2023-12-06 NOTE — Procedures (Signed)
 Patient Name: Chelsea Hansen  MRN: [id number]  Epilepsy Attending: Arleene Lack  Referring Physician/Provider: Eveline Hipps, DO  Date: 12/06/2023 Duration: 22.50 mins  Patient history: 53yo F with seizure like activity. EEG to evaluate for seizure.  Level of alertness: Awake, asleep  AEDs during EEG study: GBP  Technical aspects: This EEG study was done with scalp electrodes positioned according to the 10-20 International system of electrode placement. Electrical activity was reviewed with band pass filter of 1-70Hz , sensitivity of 7 uV/mm, display speed of 28mm/sec with a 60Hz  notched filter applied as appropriate. EEG data were recorded continuously and digitally stored.  Video monitoring was available and reviewed as appropriate.  Description: The posterior dominant rhythm consists of 9-10 Hz activity of moderate voltage (25-35 uV) seen predominantly in posterior head regions, symmetric and reactive to eye opening and eye closing. Sleep was characterized by vertex waves, sleep spindles (12 to 14 Hz), maximal frontocentral region. Physiologic photic driving was not seen during photic stimulation.  Hyperventilation was not performed.     IMPRESSION: This study is within normal limits. No seizures or epileptiform discharges were seen throughout the recording.  A normal interictal EEG does not exclude the diagnosis of epilepsy.  Lynette Topete O Kit Mollett

## 2023-12-06 NOTE — Plan of Care (Signed)
   Problem: Education: Goal: Knowledge of General Education information will improve Description: Including pain rating scale, medication(s)/side effects and non-pharmacologic comfort measures Outcome: Progressing   Problem: Health Behavior/Discharge Planning: Goal: Ability to manage health-related needs will improve Outcome: Progressing   Problem: Clinical Measurements: Goal: Ability to maintain clinical measurements within normal limits will improve Outcome: Progressing Goal: Will remain free from infection Outcome: Progressing Goal: Diagnostic test results will improve Outcome: Progressing Goal: Respiratory complications will improve Outcome: Progressing Goal: Cardiovascular complication will be avoided Outcome: Progressing   Problem: Activity: Goal: Risk for activity intolerance will decrease Outcome: Progressing   Problem: Coping: Goal: Level of anxiety will decrease Outcome: Progressing   Problem: Elimination: Goal: Will not experience complications related to bowel motility Outcome: Progressing Goal: Will not experience complications related to urinary retention Outcome: Progressing   Problem: Pain Managment: Goal: General experience of comfort will improve and/or be controlled Outcome: Progressing   Problem: Safety: Goal: Ability to remain free from injury will improve Outcome: Progressing   Problem: Skin Integrity: Goal: Risk for impaired skin integrity will decrease Outcome: Progressing

## 2023-12-06 NOTE — Progress Notes (Signed)
 Initial Nutrition Assessment  INTERVENTION:   -Boost Breeze po TID, each supplement provides 250 kcal and 9 grams of protein   -Continue bowel regimen, pt reports success after enema in previous admission  -Check vitamin labs: Vitamin B-12, B1  NUTRITION DIAGNOSIS:   Inadequate oral intake related to constipation, nausea, vomiting as evidenced by per patient/family report.  GOAL:   Patient will meet greater than or equal to 90% of their needs  MONITOR:   PO intake, Supplement acceptance  REASON FOR ASSESSMENT:   Consult Assessment of nutrition requirement/status  ASSESSMENT:   53 year old patient with oncologic history significant for left breast cancer.  Admitted on 12/05/2023 with complaints of generalized body aches, nausea, and constipation. Recent admission 6/5-6/7 for similar symptoms.  Patient in room, very flat in affect and wanting to have diet advanced. RN in room providing medications. Currently on enteric precautions but pt reports she hasn't been having diarrhea. Her main issue is constipation for the past 3 weeks. Last BM was a result of an enema last admission 6/7. Pt reports she is tolerating clears today, disappointed diet isn't advancing. Pt denies issues with swallowing or chewing, denies taste changes. Reports vision changes and numbness/tingling in extremities. Will check some vitamin labs.   Pt reports UBW ~225 lbs. At last PCP visit she weighed ~185 lbs.  Per weight records, pt has lost 15 lbs since 4/21 (7% wt loss x 2 months, significant for time frame).  Medications: Miralax , Senokot, calcium  gluconate, Lactated ringers , KCl  Labs reviewed: Low Na Low K Elevated Mg   NUTRITION - FOCUSED PHYSICAL EXAM:  Flowsheet Row Most Recent Value  Orbital Region No depletion  Upper Arm Region Mild depletion  Thoracic and Lumbar Region No depletion  Buccal Region No depletion  Temple Region No depletion  Clavicle Bone Region No depletion  Clavicle and  Acromion Bone Region No depletion  Scapular Bone Region No depletion  Dorsal Hand No depletion  Patellar Region No depletion  Anterior Thigh Region No depletion  Posterior Calf Region No depletion  Edema (RD Assessment) None  Hair Reviewed  Eyes Reviewed  Mouth Reviewed  Skin Reviewed  Nails Reviewed    Diet Order:   Diet Order             Diet clear liquid Room service appropriate? Yes; Fluid consistency: Thin  Diet effective now                   EDUCATION NEEDS:   Not appropriate for education at this time  Skin:  Skin Assessment: Reviewed RN Assessment  Last BM:  6/7 -following enema  Height:   Ht Readings from Last 1 Encounters:  12/05/23 5' 11 (1.803 m)    Weight:   Wt Readings from Last 1 Encounters:  12/05/23 85.5 kg    BMI:  Body mass index is 26.3 kg/m.  Estimated Nutritional Needs:   Kcal:  2100-2300  Protein:  100-110g  Fluid:  2.1L/day   Arna Better, MS, RD, LDN Inpatient Clinical Dietitian Contact via Secure chat

## 2023-12-06 NOTE — Hospital Course (Signed)
 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    She reported since discharge she has had difficulty eating and keeping her food down and having nausea vomiting.  She has been constipated and denied a bowel movement since her last discharge and continues to have intermittent diffuse abdominal pain.  Notably family witnessed seizure activity which abated on its own.  She is doing much better now and diet has been advanced to soft diet.  Labs are improving as well.  Nutritionist consulted and giving her supplementation.  Oncology was consulted given that she had questions about her PET CT scan and further steps about her breast cancer management.  Will obtain PT/OT Evaluation and anticipate D/C in the next 24-48 hours if tolerates diet.   Assessment and Plan:  Intractable nausea and vomiting  /  Constipation: Improving. Hx recent admission for similar symptoms 6/5 - 6/7 and improved with symptomatic treatment. Previous imaging with CT A/P on 6/4 with moderate stool burden but no acute findings; however PET on 6/5 did note stomach and proximal duodenal distension with bowel wall thickening in the proximal duodenum. Symptoms recurred after discharge and have continued to present, this admission noted to have associated AKI stage III, and electrolyte derangements below, significant dehydration and contraction. Etiology possibly related to Abemaciclib  (CDK4/6 inhibitor) with high rate of N/V as adverse  effect, ileus with associated constipation, possibly anatomical with question of SMA syndrome considering recent weight loss and recent PET imaging with gastric / prox duodenal distension, less likely gastroenteritis with prolonged symptoms, less likely CNS mets although would consider as possibility. Constipation may be opiate induced  -Checked KUB and showed a nonobstructive bowel gas pattern. -CT Abd/Pelvis done and showed Vague fat stranding along the proximal pancreas but lipase level was normal. She was also noted to have Small hiatal hernia, Colonic diverticulosis with no acute diverticulitis.Scattered appendicular and axial sclerotic metastatic osseous lesions. -S/p 2 L IV fluid, continue MIVF LR at 100 cc an hour until able to take more p.o. and will continued for another day - Symptomatic management: Ativan  as needed first-line antiemetic due to prolonged Qtc; can switch to Zofran  / compazine  when Qtc improves  - Bowel regimen ordered  - Clear liquid diet, advanced as tolerated; Now on SOFT Diet  - Nutrition evaluation and now on Supplement - Her Abemaciclib  will be held; Oncology consulted for futher evaluation and consideration stopping at time of discharge considering her significant GI symptoms.    Seizure: Family states that they witnessed her having a seizure episode on the night that she got hospitalized.  Patient states that she has had seizures in the past but is not on any medication.  Discussed with neurology and they recommend no antiepileptics at this time given that it is a provoked seizure likely in the setting of electrolyte abnormalities.  MRI of the brain done and showed no evidence of any intracranial abnormality.  EEG has been done and is pending read.  Continue seizure precautions.  Electrolytes are being repleted and she is improving and much more awake and alert today  Acute kidney injury  stage IIIa Baseline creatinine 0.8-0.9.  Elevated at 3.37 on admission.  With  prerenal azotemia BUN elevated at 47.  Overall history and findings suggestive of prerenal injury possible conversion to intrinsic injury. BUN/Cr Trend significantly improved and she went from 47/3.37 -> 41/1.74 -> 28/1.08 -IVF as above.  Checking UA and still pending.  Will consider renal ultrasound if not improving however renal fxn is trending down nicely. Avoid Nephrotoxic Medications, Contrast Dyes, Hypotension and Dehydration to Ensure Adequate Renal Perfusion and will need to Renally Adjust Meds. CTM and Trend Renal Function carefully and repeat CMP in the AM    Multiple electrolyte abnormalities  Severe Hypocalcemia, symptomatic  Hypokalemia  Hypochloremia  Contraction Alkalosis  All presumed secondary to GI losses and dehydration  -C/w electrolyte Repletion, maintenance IV fluid per above  -Na+ is now 132, K+ is 3.3, Mag is 2.5, Phos 2.5, and Calcium  is 7.0.  -Repeat BMP mag Phos at 2 PM   Increased anion gap, possible mixed acid base disorder: Lactiic Normal. AG is resolved and improved.    Abnormal EKG  Hx episode of chest pain / SOB  Reports episode of CP / Sob prior to presentation. EKG with ischemic global ST depression.  Possible demand ischemia in the setting of hypovolemia -Checked high-sensitivity troponin 7; No further Chest Pain    Prolonged Qtc: Qtc was 592, in setting of hypokalemia, hypocalcemia ; Serial electrolyte repletion's.Avoid QT prolonging medications. Repeat EKG showed improvement of Qtc to 470   Acute liver injury, hepatocellular pattern: Chronic elevation of Alk phos likely secondary to bone mets. Mild elevation in transaminase with AST 78, ALT 109, T Bili 1.4. Alk phos chronic elevation currently 207 likely from bone. Suspect transaminase elevation I/s/o systemic illness per above, possibly related to Verzenio . AST went from 78 -> 57 -> 51. ALT went from 109 -> 79 -> 71. Continue to Monitor and Trend LFT, check lipase with GI symptoms     Metastatic breast  CA to bone: s/p neoadjuvant chemo, lumpectomy and SLN Bx, radiation, now on Anastrazole and Abemaciclib ; Follows with Oncology Dr. [name] and NP Debbie Fails, added to treatment team. See above re: possible GI AE of Abemaciclib  would discuss discontinuation at time of discharge as it has been held here. Oncology consulted for further evaluation  Asthma: Albuterol  prn, continue home singulair ; CTM respiratory Status carefully  SVT: Not taking propranolol  prior to admission, continue to hold   Essential HTN: antiHTN have been discontinued last admission. CTM BP per Protocol. Last BP  HLD: Not on cholestrol lowering agents   Chronic malignancy related pain: Reduce Gabapentin  to 300 mg BID in setting of renal injury. Continue home Oxycontin  10 mg q 12 hr, and Oxycodone  7.5 / 15 mg for mod/severe q 6 hr prn.   Pulmonary nodules: Noted on outpatient imaging, without PET uptake, continue outpatient followup

## 2023-12-06 NOTE — Progress Notes (Signed)
Routine EEG complete. Results pending.

## 2023-12-07 ENCOUNTER — Other Ambulatory Visit (HOSPITAL_BASED_OUTPATIENT_CLINIC_OR_DEPARTMENT_OTHER): Payer: Self-pay

## 2023-12-07 ENCOUNTER — Other Ambulatory Visit (HOSPITAL_COMMUNITY): Payer: Self-pay

## 2023-12-07 ENCOUNTER — Encounter: Payer: Self-pay | Admitting: Hematology and Oncology

## 2023-12-07 DIAGNOSIS — E876 Hypokalemia: Secondary | ICD-10-CM

## 2023-12-07 DIAGNOSIS — R739 Hyperglycemia, unspecified: Secondary | ICD-10-CM

## 2023-12-07 DIAGNOSIS — Z170 Estrogen receptor positive status [ER+]: Secondary | ICD-10-CM

## 2023-12-07 DIAGNOSIS — C50411 Malignant neoplasm of upper-outer quadrant of right female breast: Secondary | ICD-10-CM

## 2023-12-07 LAB — COMPREHENSIVE METABOLIC PANEL WITH GFR
ALT: 59 U/L — ABNORMAL HIGH (ref 0–44)
AST: 40 U/L (ref 15–41)
Albumin: 3.7 g/dL (ref 3.5–5.0)
Alkaline Phosphatase: 166 U/L — ABNORMAL HIGH (ref 38–126)
Anion gap: 13 (ref 5–15)
BUN: 20 mg/dL (ref 6–20)
CO2: 27 mmol/L (ref 22–32)
Calcium: 8.1 mg/dL — ABNORMAL LOW (ref 8.9–10.3)
Chloride: 92 mmol/L — ABNORMAL LOW (ref 98–111)
Creatinine, Ser: 0.8 mg/dL (ref 0.44–1.00)
GFR, Estimated: >60 mL/min (ref >60)
Glucose, Bld: 94 mg/dL (ref 70–99)
Potassium: 3.2 mmol/L — ABNORMAL LOW (ref 3.5–5.1)
Sodium: 132 mmol/L — ABNORMAL LOW (ref 135–145)
Total Bilirubin: 1 mg/dL (ref 0.0–1.2)
Total Protein: 7.5 g/dL (ref 6.5–8.1)

## 2023-12-07 LAB — CBC WITH DIFFERENTIAL/PLATELET
Abs Immature Granulocytes: 0.17 10*3/uL — ABNORMAL HIGH (ref 0.00–0.07)
Basophils Absolute: 0.1 10*3/uL (ref 0.0–0.1)
Basophils Relative: 1 %
Eosinophils Absolute: 0.1 10*3/uL (ref 0.0–0.5)
Eosinophils Relative: 1 %
HCT: 35.5 % — ABNORMAL LOW (ref 36.0–46.0)
Hemoglobin: 11.3 g/dL — ABNORMAL LOW (ref 12.0–15.0)
Immature Granulocytes: 3 %
Lymphocytes Relative: 19 %
Lymphs Abs: 1 10*3/uL (ref 0.7–4.0)
MCH: 23.9 pg — ABNORMAL LOW (ref 26.0–34.0)
MCHC: 31.8 g/dL (ref 30.0–36.0)
MCV: 75.1 fL — ABNORMAL LOW (ref 80.0–100.0)
Monocytes Absolute: 0.6 10*3/uL (ref 0.1–1.0)
Monocytes Relative: 11 %
Neutro Abs: 3.3 10*3/uL (ref 1.7–7.7)
Neutrophils Relative %: 65 %
Platelets: 209 10*3/uL (ref 150–400)
RBC: 4.73 MIL/uL (ref 3.87–5.11)
RDW: 14.2 % (ref 11.5–15.5)
WBC: 5.1 10*3/uL (ref 4.0–10.5)
nRBC: 0.4 % — ABNORMAL HIGH (ref 0.0–0.2)

## 2023-12-07 LAB — PHOSPHORUS: Phosphorus: 3.2 mg/dL (ref 2.5–4.6)

## 2023-12-07 LAB — VITAMIN B12: Vitamin B-12: 485 pg/mL (ref 180–914)

## 2023-12-07 LAB — MAGNESIUM: Magnesium: 2.5 mg/dL — ABNORMAL HIGH (ref 1.7–2.4)

## 2023-12-07 MED ORDER — PROCHLORPERAZINE MALEATE 10 MG PO TABS
10.0000 mg | ORAL_TABLET | Freq: Four times a day (QID) | ORAL | 1 refills | Status: DC | PRN
  Filled 2023-12-07: qty 30, 8d supply, fill #0

## 2023-12-07 MED ORDER — ENOXAPARIN SODIUM 40 MG/0.4ML IJ SOSY
40.0000 mg | PREFILLED_SYRINGE | INTRAMUSCULAR | Status: DC
  Administered 2023-12-07: 40 mg via SUBCUTANEOUS
  Filled 2023-12-07: qty 0.4

## 2023-12-07 MED ORDER — SCOPOLAMINE 1 MG/3DAYS TD PT72
1.0000 | MEDICATED_PATCH | TRANSDERMAL | 12 refills | Status: DC | PRN
  Filled 2023-12-07: qty 10, 30d supply, fill #0

## 2023-12-07 MED ORDER — POTASSIUM CHLORIDE CRYS ER 20 MEQ PO TBCR
40.0000 meq | EXTENDED_RELEASE_TABLET | Freq: Every day | ORAL | 0 refills | Status: DC
  Filled 2023-12-07: qty 6, 3d supply, fill #0

## 2023-12-07 MED ORDER — POTASSIUM CHLORIDE CRYS ER 20 MEQ PO TBCR
40.0000 meq | EXTENDED_RELEASE_TABLET | Freq: Once | ORAL | Status: AC
  Administered 2023-12-07: 40 meq via ORAL
  Filled 2023-12-07: qty 2

## 2023-12-07 NOTE — Telephone Encounter (Signed)
 I stopped by to see Chelsea Hansen in the hospital, however she was already discharged. I called her and spoke with her via telephone.  I reviewed her PET scan and shared that things appear stable and I recommended that she stop the Verzenio  for the next week and return on Thursday 6/26 for labs and f/u to discuss next steps and her appointment with GI the day prior.  Patient verbalized understanding and appreciation. I spoke with scheduling who scheduled the appointment for her.    Alwin Baars, NP 12/07/23 3:42 PM Medical Oncology and Hematology [facility] [address] [place], [place] [address] Tel. [phone]    Fax. [phone]

## 2023-12-07 NOTE — Progress Notes (Signed)
   12/07/23 1221  TOC Brief Assessment  Insurance and Status Reviewed  Patient has primary care physician Yes  Home environment has been reviewed home alone  Prior level of function: independent  Prior/Current Home Services No current home services  Social Drivers of Health Review SDOH reviewed no interventions necessary  Readmission risk has been reviewed Yes  Transition of care needs no transition of care needs at this time

## 2023-12-07 NOTE — Plan of Care (Signed)

## 2023-12-07 NOTE — Discharge Summary (Signed)
 Physician Discharge Summary   Patient: Chelsea Hansen MRN: [id number] DOB: 04-Jun-1971  Admit date:     12/05/2023  Discharge date: 12/07/23  Discharge Physician: Bertram Brocks, MD    PCP: Marius Siemens, NP   Recommendations at discharge:   HOLD Abemaciclib  until further instructions from her oncologist, Dr Lee Public  Hold propranolol  due to soft BP  Discharge Diagnoses:    AKI (acute kidney injury) (HCC) Intractable nausea and vomiting resolved Constipation improving Seizure-like activity Hypokalemia Hypocalcemia Prolonged QTc Dehydration Asthma SVT Essential hypertension Metastatic breast CA Pulmonary nodules  Hospital Course:  Patient is a 53 year old female with metastatic breast cancer to the bone status post neoadjuvant chemotherapy lumpectomy, and SLN biopsy, radiation who is on as anastrozole  and abemaciclib  with additional history including asthma, SVT, hypertension, hyperlipidemia, chronic cancer-related pain who was recently admitted from 6/5 until 6/7 with intractable nausea vomiting constipation treated symptomatically who now returns with continued intractable nausea vomiting and constipation.     She reported since discharge she has had difficulty eating and keeping her food down and having nausea vomiting.  She has been constipated and denied a bowel movement since her last discharge and continues to have intermittent diffuse abdominal pain.  Notably family witnessed seizure activity which abated on its own.  She is doing much better now and diet has been advanced to soft diet.  Labs are improving as well.  Nutritionist consulted and giving her supplementation.  Oncology was consulted given that she had questions about her PET CT scan and further steps about her breast cancer management.    Assessment and Plan:   Intractable nausea and vomiting  /  Constipation: Improving.  - Hx recent admission for similar symptoms 6/5 - 6/7 and improved with symptomatic  treatment. Previous imaging with CT A/P on 6/4 with moderate stool burden but no acute findings; however PET on 6/5 did note stomach and proximal duodenal distension with bowel wall thickening in the proximal duodenum. Symptoms recurred after discharge and have continued to present - Etiology possibly related to Abemaciclib  (CDK4/6 inhibitor) with high rate of N/V as adverse effect, ileus with associated constipation which may be opiate induced  - KUB showed nonobstructive bowel gas pattern --CT Abd/Pelvis done and showed Vague fat stranding along the proximal pancreas but lipase level was normal. Small hiatal hernia, Colonic diverticulosis with no acute diverticulitis.Scattered appendicular and axial sclerotic metastatic osseous lesions. - Received symptomatic management, IV fluids, antiemetics, now resolved, tolerating solid diet without difficulty.   - Continue soft diet, bowel regimen. -  Abemaciclib  will be held, she will discuss outpatient with Dr. [name] for further course of action.    Seizure: - Family reported seizure like activity on the night of admission.  Neurology was consulted, recommended MRI brain and EEG.  - MRI brain negative for acute intracranial abnormality  - EEG showed no seizure or epileptiform discharges throughout the recording.  Per neurology, no additional inpatient neurological testing at this time.    Acute kidney injury stage IIIa - Baseline creatinine 0.8-0.9.  Elevated at 3.37 on admission, likely prerenal azotemia - Now improved, creatinine 0.8 at discharge  Electrolyte abnormalities, hypokalemia, hypocalcemia, hypochloremic metabolic alkalosis -Resolved     Multiple electrolyte abnormalities  Severe Hypocalcemia, symptomatic, metabolic alkalosis, hyponatremia -Likely due to dehydration, nausea and vomiting - Improving     Abnormal EKG  Hx episode of chest pain / SOB  Reports episode of CP / Sob prior to presentation. EKG with ischemic global ST  depression.  Possible demand ischemia in the setting of hypovolemia - Troponins negative, no further chest pain.   Prolonged Qtc:  - Qtc was 592, in setting of hypokalemia, hypocalcemia - Electrolytes replaced, QTc improved to 470    Acute liver injury, hepatocellular pattern: -  Chronic elevation of Alk phos likely secondary to bone mets. Mild elevation in transaminase with AST 78, ALT 109, T Bili 1.4. Alk phos chronic elevation currently 207 likely from bone. Suspect transaminase elevation I/s/o systemic illness per above, possibly related to Verzenio .  - LFTs improving, follow cMET outpatient     Metastatic breast CA to bone:  - s/p neoadjuvant chemo, lumpectomy and SLN Bx, radiation, now on Anastrazole and Abemaciclib   - Follows with Oncology Dr. [name] - Due to possibility of severe GI effects from Abemaciclib , currently held   Asthma: Albuterol  prn, continue home singulair ; CTM respiratory Status carefully   SVT: Not taking propranolol  prior to admission, continue to hold    Essential HTN: BP soft, hold propranolol    HLD: Not on cholestrol lowering agents    Chronic malignancy related pain:  - Continue gabapentin , home Oxycontin  10 mg q 12 hr, and Oxycodone  7.5 / 15 mg for mod/severe q 6 hr prn.    Pulmonary nodules: Noted on outpatient imaging, without PET uptake, continue outpatient followup      Pain control - [place]  Controlled Substance Reporting System database was reviewed. and patient was instructed, not to drive, operate heavy machinery, perform activities at heights, swimming or participation in water activities or provide baby-sitting services while on Pain, Sleep and Anxiety Medications; until their outpatient Physician has advised to do so again. Also recommended to not to take more than prescribed Pain, Sleep and Anxiety Medications.  Consultants: Oncology Procedures performed:   Disposition: Home Diet recommendation:  Discharge Diet Orders (From  admission, onward)     Start     Ordered   12/07/23 0000  Diet - low sodium heart healthy        12/07/23 1222            DISCHARGE MEDICATION: Allergies as of 12/07/2023   No Known Allergies      Medication List     PAUSE taking these medications    abemaciclib  50 MG tablet Wait to take this until your doctor or other care provider tells you to start again. Commonly known as: VERZENIO  Take 1 tablet (50 mg total) by mouth 2 (two) times daily.   propranolol  10 MG tablet Wait to take this until your doctor or other care provider tells you to start again. Commonly known as: INDERAL  Take 1 tablet (10 mg total) by mouth 2 (two) times daily. Please call [phone] to schedule an appointment for future refills. Thank you. 3rd attempt.       TAKE these medications    anastrozole  1 MG tablet Commonly known as: ARIMIDEX  Take 1 tablet (1 mg total) by mouth daily.   gabapentin  300 MG capsule Commonly known as: NEURONTIN  Take 1 capsule (300 mg total) by mouth 3 (three) times daily.   montelukast  10 MG tablet Commonly known as: SINGULAIR  Take 1 tablet (10 mg total) by mouth at bedtime. What changed:  when to take this reasons to take this   ondansetron  4 MG disintegrating tablet Commonly known as: ZOFRAN -ODT Dissolve 1 tablet (4 mg total) by mouth every 8 (eight) hours as needed for nausea or vomiting. What changed: reasons to take this   oxyCODONE -acetaminophen  7.5-325 MG tablet Commonly known as:  Percocet Take 1-2 tablets by mouth every 6 (six) hours as needed for severe pain (pain score 7-10).   OxyCONTIN  10 MG 12 hr tablet Generic drug: oxyCODONE  Take 1 tablet (10 mg total) by mouth every 12 (twelve) hours.   Oyster Shell Calcium  w/D 500-5 MG-MCG Tabs Take 1 tablet by mouth daily with supper.   polyethylene glycol powder 17 GM/SCOOP powder Commonly known as: GLYCOLAX /MIRALAX  Mix 17 g in 4 oz of liquid and take by mouth 2 (two) times daily as needed.    potassium chloride  SA 20 MEQ tablet Commonly known as: KLOR-CON  M Take 2 tablets (40 mEq total) by mouth daily for 3 days.   prochlorperazine  10 MG tablet Commonly known as: COMPAZINE  Take 1 tablet (10 mg total) by mouth every 6 (six) hours as needed for nausea or vomiting.   scopolamine  1 MG/3DAYS Commonly known as: TRANSDERM-SCOP Place 1 patch (1.5 mg total) onto the skin every 3 (three) days as needed (Dizziness, nausea or vomiting).   senna 8.6 MG Tabs tablet Commonly known as: SENOKOT Take 2 tablets (17.2 mg total) by mouth at bedtime.   Senna-Plus 8.6-50 MG tablet Generic drug: senna-docusate Take 1 tablet by mouth daily.   Ventolin  HFA 108 (90 Base) MCG/ACT inhaler Generic drug: albuterol  Inhale 2 puffs by mouth into the lungs every 6 hours as needed for wheezing or shortness of breath.        Follow-up Information     Cameron Cea, MD. Schedule an appointment as soon as possible for a visit in 2 week(s).   Specialty: Hematology and Oncology Why: for hospital follow-up Contact information: [address] [place] [address] [phone]         Marius Siemens, NP. Schedule an appointment as soon as possible for a visit in 2 week(s).   Specialty: Internal Medicine Why: for hospital follow-up Contact information: [address] [place] [address] [phone]                Discharge Exam: Cleavon Curls Weights   12/05/23 0110  Weight: 85.5 kg   S: No acute complaints, tolerating diet, no active nausea or vomiting, chest pain, shortness of breath, abdominal pain or any diarrhea.   BP 98/65 (BP Location: Left Arm)   Pulse 74   Temp 98.3 F (36.8 C) (Oral)   Resp 18   Ht 5' 11 (1.803 m)   Wt 85.5 kg   SpO2 98%   BMI 26.30 kg/m    Physical Exam General: Alert and oriented x 3, NAD Cardiovascular: S1 S2 clear, RRR.  Respiratory: CTAB, no wheezing, rales or rhonchi Gastrointestinal: Soft, nontender, nondistended,  NBS Ext: no pedal edema bilaterally Neuro: no new deficits Psych: Normal affect    Condition at discharge: fair  The results of significant diagnostics from this hospitalization (including imaging, microbiology, ancillary and laboratory) are listed below for reference.   Imaging Studies: EEG adult Result Date: 12/06/2023 Arleene Lack, MD     12/07/2023  9:22 AM Patient Name: TAQUILA LEYS MRN: [id number] Epilepsy Attending: Arleene Lack Referring Physician/Provider: Eveline Hipps, DO Date: 12/06/2023 Duration: 22.50 mins Patient history: 53yo F with seizure like activity. EEG to evaluate for seizure. Level of alertness: Awake, asleep AEDs during EEG study: GBP Technical aspects: This EEG study was done with scalp electrodes positioned according to the 10-20 International system of electrode placement. Electrical activity was reviewed with band pass filter of 1-70Hz , sensitivity of 7 uV/mm, display speed of 27mm/sec with  a 60Hz  notched filter applied as appropriate. EEG data were recorded continuously and digitally stored.  Video monitoring was available and reviewed as appropriate. Description: The posterior dominant rhythm consists of 9-10 Hz activity of moderate voltage (25-35 uV) seen predominantly in posterior head regions, symmetric and reactive to eye opening and eye closing. Sleep was characterized by vertex waves, sleep spindles (12 to 14 Hz), maximal frontocentral region. Physiologic photic driving was not seen during photic stimulation.  Hyperventilation was not performed.   IMPRESSION: This study is within normal limits. No seizures or epileptiform discharges were seen throughout the recording. A normal interictal EEG does not exclude the diagnosis of epilepsy. Priyanka Suzanne Erps   CT ABDOMEN PELVIS WO CONTRAST Result Date: 12/06/2023 CLINICAL DATA:  Abdominal pain, acute, nonlocalized Nausea and Vomiting Hx recent admission for similar symptoms 6/5 - 6/7 and improved with  symptomatic treatment. Previous imaging with CT A/P on 6/4 with moderate stool burden but no acute findings; however PET on 6/5 did note stomach and proximal duodenal EXAM: CT ABDOMEN AND PELVIS WITHOUT CONTRAST TECHNIQUE: Multidetector CT imaging of the abdomen and pelvis was performed following the standard protocol without IV contrast. RADIATION DOSE REDUCTION: This exam was performed according to the departmental dose-optimization program which includes automated exposure control, adjustment of the mA and/or kV according to patient size and/or use of iterative reconstruction technique. COMPARISON:  PET CT 11/24/2023 ,CT abdomen pelvis 11/23/2023 FINDINGS: Lower chest: Small hiatal hernia.  No acute abnormality. Hepatobiliary: No focal lesion. No gallstones, gallbladder wall thickening, or pericholecystic fluid. No biliary dilatation. Pancreas: Vague fat stranding along the proximal pancreas. No main pancreatic ductal dilatation. Spleen: Normal in size without focal abnormality. Splenule is noted. Adrenals/Urinary Tract: No adrenal nodule bilaterally. No nephrolithiasis and no hydronephrosis. No definite contour-deforming renal mass. No ureterolithiasis or hydroureter. The urinary bladder is unremarkable. Stomach/Bowel: Stomach is within normal limits. No evidence of bowel wall thickening or dilatation. Colonic diverticulosis. Appendix appears normal. Vascular/Lymphatic: No abdominal aorta or iliac aneurysm. No abdominal, pelvic, or inguinal lymphadenopathy. Reproductive: Status post hysterectomy. No adnexal masses. Other: No intraperitoneal free fluid. No intraperitoneal free gas. No organized fluid collection. Musculoskeletal: No abdominal wall hernia or abnormality. Scattered appendicular and axial skeleton metastatic osseous lesions. No acute displaced fracture. Chronic compression fracture of the L3 level with likely underlying vertebral body hemangioma. Multilevel degenerative changes of the spine.  IMPRESSION: 1. Vague fat stranding along the proximal pancreas. Recommend correlation with lipase levels. 2. Small hiatal hernia. 3. Colonic diverticulosis with no acute diverticulitis. 4. Scattered appendicular and axial sclerotic metastatic osseous lesions. Electronically Signed   By: Morgane  Naveau M.D.   On: 12/06/2023 00:47   DG CHEST PORT 1 VIEW Result Date: 12/05/2023 CLINICAL DATA:  SOB EXAM: PORTABLE CHEST 1 VIEW COMPARISON:  01/28/2021. FINDINGS: The heart size and mediastinal contours are within normal limits. Both lungs are clear. The visualized skeletal structures are unremarkable. IMPRESSION: No active disease. Electronically Signed   By: Sydell Eva M.D.   On: 12/05/2023 19:25   MR BRAIN WO CONTRAST Result Date: 12/05/2023 CLINICAL DATA:  Mental status change, unknown cause EXAM: MRI HEAD WITHOUT CONTRAST TECHNIQUE: Multiplanar, multiecho pulse sequences of the brain and surrounding structures were obtained without intravenous contrast. COMPARISON:  Same day CT head. FINDINGS: Brain: No acute infarction, hemorrhage, hydrocephalus, extra-axial collection or mass lesion. Mild to moderate scattered T2/FLAIR hyperintensities the white matter, nonspecific but compatible with chronic microvascular ischemic change. Vascular: Major arterial flow voids are maintained at the skull  base. Skull and upper cervical spine: Normal marrow signal. Sinuses/Orbits: Right maxillary sinus retention cyst. Otherwise, largely clear sinuses. No acute orbital findings. Other: No mastoid effusions. IMPRESSION: No evidence of acute intracranial abnormality. Electronically Signed   By: Stevenson Elbe M.D.   On: 12/05/2023 18:46   DG Abd 1 View Result Date: 12/05/2023 CLINICAL DATA:  Nausea and vomiting. EXAM: ABDOMEN - 1 VIEW COMPARISON:  11/01/2013 FINDINGS: Supine abdomen shows no gaseous bowel dilatation to suggest obstruction. No unexpected abdominopelvic calcification. Degenerative changes are noted in the  lumbar spine. IMPRESSION: Nonobstructive bowel gas pattern. Electronically Signed   By: Donnal Fusi M.D.   On: 12/05/2023 05:16   CT Head Wo Contrast Result Date: 12/05/2023 CLINICAL DATA:  History of breast cancer presenting with diplopia with dizziness and weakness. EXAM: CT HEAD WITHOUT CONTRAST TECHNIQUE: Contiguous axial images were obtained from the base of the skull through the vertex without intravenous contrast. RADIATION DOSE REDUCTION: This exam was performed according to the departmental dose-optimization program which includes automated exposure control, adjustment of the mA and/or kV according to patient size and/or use of iterative reconstruction technique. COMPARISON:  June 29, 2022 FINDINGS: Brain: No evidence of acute infarction, hemorrhage, hydrocephalus, extra-axial collection or mass lesion/mass effect. Vascular: No hyperdense vessel or unexpected calcification. Skull: Normal. Negative for fracture or focal lesion. Sinuses/Orbits: No acute finding. Other: None. IMPRESSION: No acute intracranial pathology. Electronically Signed   By: Virgle Grime M.D.   On: 12/05/2023 03:29   NM PET Image Restag (PS) Skull Base To Thigh Result Date: 12/01/2023 CLINICAL DATA:  Subsequent treatment strategy for metastatic breast cancer. EXAM: NUCLEAR MEDICINE PET SKULL BASE TO THIGH TECHNIQUE: 9.92 mCi F-18 FDG was injected intravenously. Full-ring PET imaging was performed from the skull base to thigh after the radiotracer. CT data was obtained and used for attenuation correction and anatomic localization. Fasting blood glucose: 100 mg/dl COMPARISON:  PET-CT 28/41/3244. CT scan chest abdomen pelvis 11/23/2023. FINDINGS: Mediastinal blood pool activity: SUV max 3.2 Liver activity: SUV max 3.8 NECK: No specific abnormal uptake seen above blood pool in the neck including lymph node change of the submandibular, posterior triangle or internal jugular region. The previous hypermetabolic submandibular node  is no longer identified. Near symmetric uptake of radiotracer along the visualized intracranial compartment. Incidental CT findings: The submandibular glands, parotid glands and thyroid  gland are unremarkable. There is some small less than 1 cm in size short axis nodes in the neck, not pathologic by size criteria and similar to previous. Right maxillary sinus mucous retention cyst or polyp. Jewelry along the tongue. CHEST: No specific abnormal radiotracer uptake seen above blood pool in the axillary region, hilum or mediastinum. Previous areas of uptake and thoracic nodal enlargement are improved from the prior PET-CT but were described as being improved on the more recent CT scan of the chest abdomen and pelvis. There is some nonspecific mild uptake along the lower esophagus. Please correlate with any symptoms. No abnormal lung uptake identified this time. Incidental CT findings: Stable cardiac size. No pericardial effusion. The thoracic aorta is normal course and caliber. Small hiatal hernia. Small area of calcification along the SVC near the junction to the azygous. Breathing motion. No consolidation, pneumothorax or effusion. As seen previously there are several scattered tiny nodules identified in the lungs particularly the right upper lobe in a similar distribution to the recent prior examination. These appear progressive from the older CT scan as part of the PET of December 2024. No associated abnormal  uptake but again these lesions are quite small. Please correlate for any clinical presentation otherwise would recommend short follow-up. ABDOMEN/PELVIS: There is physiologic distribution radiotracer along the parenchymal organs, bowel and renal collecting systems. On prior PET-CT there are some mildly enlarged hypermetabolic upper retroperitoneal nodes which again has shown improvement on the more recent CT scan and today do not show any abnormal residual uptake. There are some right inguinal nodes which show  uptake today however. Example has maximum SUV value of 6.3 and on image 188 node measures 16 by 15 mm. This same node in December 2024 would measured 14 x 13 mm the and had maximum SUV of only 1.4. A new area of disease is possible but there is a differential. Incidental CT findings: Grossly the liver, spleen, adrenal glands and pancreas are unremarkable. Gallbladder is present. There is some contrast within the renal collecting systems the kidneys but this could relate to the recent CT scan of chest abdomen and pelvis. Contrast in the bladder which is relatively contracted. Normal caliber aorta and IVC. Bowel has a normal course and caliber. Scattered colonic stool. Scattered colonic diverticula. The small bowel is nondilated. Stomach is distended with fluid and debris. Questionable stranding in the area of the second portion the duodenum. There is a caliber change in the bowel this location. Please correlate with clinical findings. There is no specific abnormal uptake in this location. SKELETON: Once again there is some scattered sclerotic bone metastases identified particularly along the spine and pelvis. Additional areas along the sternum as well as the clavicles. By CT distribution is similar. Few areas are larger such as along the sternal region on image 88 compared to the study of December 2024. Is also some mild patchy marrow uptake identified in several areas which are increased such as along the left clavicular head with maximum SUV of 5.3. Sternal area on image 76 of 3.5 SUV maximum. T12 vertebral body maximum SUV value 5.3. Other examples as well of more uptake and more sclerosis. Incidental CT findings: Scattered degenerative changes identified. Curvature of the spine. Compression deformity of L3. IMPRESSION: Sclerotic bone metastases again identified. Several areas of sclerosis of increased in appearance compared to December 2024 and show more uptake. Distribution of disease is similar. Stable  compression of L3 vertebral body. The previous uptake and increased size nodal disease on the December 2024 study has shown significant improvement. There is 1 lymph node in the right inguinal region which is slightly larger and shows some increased uptake. This could be a metastatic node but does have a differential. Please correlate with clinical findings. Once again several tiny lung nodules identified particularly in the right upper lobe. These do not show abnormal uptake. Please correlate for clinical presentation. This would have a differential and recommend attention on close follow-up. Distended stomach with fluid and debris with a caliber change at the proximal duodenum. There is slight soft tissue thickening this location without abnormal uptake. Please correlate with specific symptoms. Further workup as clinically appropriate. Electronically Signed   By: Adrianna Horde M.D.   On: 12/01/2023 15:04   CT Angio Chest PE W and/or Wo Contrast Result Date: 11/23/2023 CLINICAL DATA:  Chest pain, shortness of breath EXAM: CT ANGIOGRAPHY CHEST WITH CONTRAST TECHNIQUE: Multidetector CT imaging of the chest was performed using the standard protocol during bolus administration of intravenous contrast. Multiplanar CT image reconstructions and MIPs were obtained to evaluate the vascular anatomy. RADIATION DOSE REDUCTION: This exam was performed according to the departmental  dose-optimization program which includes automated exposure control, adjustment of the mA and/or kV according to patient size and/or use of iterative reconstruction technique. CONTRAST:  OMNIPAQUE  IOHEXOL  350 MG/ML SOLN COMPARISON:  None Available. FINDINGS: Cardiovascular: Satisfactory opacification of the pulmonary arteries to the segmental level. No evidence of pulmonary embolism. Normal heart size. No pericardial effusion. Mediastinum/Nodes: No enlarged mediastinal, hilar, or axillary lymph nodes. Thyroid  gland, trachea, and esophagus  demonstrate no significant findings. Lungs/Pleura: There is an ill-defined nodular pattern throughout both lungs with small small nodular densities that measured between a few mm up to 4 mm bilaterally. These findings in combination with the previously described findings of metastatic bone disease raises the possibility of metastatic lung disease. There is no other infiltrates or consolidations or pleural effusions. Upper Abdomen: No acute abnormality. Musculoskeletal: Diffuse sclerotic metastatic bone disease of the thoracic spine, sternum and ribs. Review of the MIP images confirms the above findings. IMPRESSION: *No evidence of pulmonary embolism. *Diffuse sclerotic metastatic bone disease. *Ill-defined nodular pattern throughout both lungs with small nodular densities that measured between a few mm up to 4 mm bilaterally. These findings in combination with the previously described findings of metastatic bone disease raises the possibility of metastatic lung disease. Electronically Signed   By: Fredrich Jefferson M.D.   On: 11/23/2023 15:20   CT ABDOMEN PELVIS W CONTRAST Result Date: 11/23/2023 CLINICAL DATA:  Abdominal pain EXAM: CT ABDOMEN AND PELVIS WITH CONTRAST TECHNIQUE: Multidetector CT imaging of the abdomen and pelvis was performed using the standard protocol following bolus administration of intravenous contrast. RADIATION DOSE REDUCTION: This exam was performed according to the departmental dose-optimization program which includes automated exposure control, adjustment of the mA and/or kV according to patient size and/or use of iterative reconstruction technique. CONTRAST:  OMNIPAQUE  IOHEXOL  350 MG/ML SOLN COMPARISON:  CT abdomen and pelvis August 23, 2022 FINDINGS: Lower chest: No infiltrates or consolidations, no pleural effusions Hepatobiliary: Liver normal size no masses no biliary dilatation. Gallbladder unremarkable. No gallstones. Pancreas: Pancreas normal size. No masses calcifications or  inflammatory changes. Spleen: Spleen normal size.  No masses. Adrenals/Urinary Tract: Adrenal glands are normal size. Follow-up recommended. Kidneys are normal. No masses calcifications or hydronephrosis Stomach/Bowel: No small or large bowel obstruction or inflammatory changes. Diffuse pan diverticulosis unchanged since prior. Moderate amount of residual fecal material throughout the colon without obstruction or constipation. Vascular/Lymphatic: No significant vascular findings are present. No enlarged abdominal or pelvic lymph nodes. Reproductive: .  No masses. Bladder unremarkable. Other: Anterior abdominal wall unremarkable without evidence of umbilical or inguinal hernias Musculoskeletal: Again noted multiple sclerotic lesions involving the thoracolumbar spine with fracture deformity of L3 correlates with metastatic bone disease. IMPRESSION: *No acute findings in the abdomen or pelvis. *Diffuse pan diverticulosis unchanged since prior. *Moderate amount of residual fecal material throughout the colon without obstruction or constipation. *Again noted multiple sclerotic lesions involving the thoracolumbar spine with fracture deformity of L3 correlates with metastatic bone disease. Electronically Signed   By: Fredrich Jefferson M.D.   On: 11/23/2023 15:17    Microbiology: Results for orders placed or performed in visit on 03/21/23  COVID-19, Flu A+B and RSV     Status: None   Collection Time: 03/21/23  1:55 PM   Specimen: Nasal Swab   Nasal Swab  Resident i  Result Value Ref Range Status   SARS-CoV-2, NAA Not Detected Not Detected Final   Influenza A, NAA Not Detected Not Detected Final   Influenza B, NAA Not Detected Not  Detected Final   RSV, NAA Not Detected Not Detected Final   Test Information: Comment  Final    Comment: This nucleic acid amplification test was developed and its performance characteristics determined by [employer]. Nucleic acid amplification tests include RT-PCR and TMA.  This test has not been FDA cleared or approved. This test has been authorized by FDA under an Emergency Use Authorization (EUA). This test is only authorized for the duration of time the declaration that circumstances exist justifying the authorization of the emergency use of in vitro diagnostic tests for detection of SARS-CoV-2 virus and/or diagnosis of COVID-19 infection under section 564(b)(1) of the Act, 21 U.S.C. 045WUJ-8(J) (1), unless the authorization is terminated or revoked sooner. When diagnostic testing is negative, the possibility of a false negative result should be considered in the context of a patient's recent exposures and the presence of clinical signs and symptoms consistent with COVID-19. An individual without symptoms of COVID-19 and who is not shedding SARS-CoV-2 virus wo uld expect to have a negative (not detected) result in this assay.    *Note: Due to a large number of results and/or encounters for the requested time period, some results have not been displayed. A complete set of results can be found in Results Review.    Labs: CBC: Recent Labs  Lab 12/01/23 1307 12/05/23 0123 12/05/23 1705 12/06/23 0638 12/07/23 0555  WBC 4.1 7.0 5.5 4.5 5.1  NEUTROABS 2.3 4.8 3.7 3.0 3.3  HGB 11.6* 12.0 10.6* 10.3* 11.3*  HCT 36.6 38.2 34.6* 33.8* 35.5*  MCV 74.2* 74.8* 77.4* 76.5* 75.1*  PLT 289 350 253 220 209   Basic Metabolic Panel: Recent Labs  Lab 12/05/23 0123 12/05/23 0531 12/05/23 1416 12/05/23 1705 12/06/23 0638 12/07/23 0555  NA 137  --  135 134* 132* 132*  K 2.8*  --  2.9* 2.6* 3.3* 3.2*  CL 78*  --  88* 86* 89* 92*  CO2 35*  --  31 35* 29 27  GLUCOSE 132*  --  88 109* 91 94  BUN 47*  --  41* 38* 28* 20  CREATININE 3.37*  --  1.74* 1.55* 1.04* 0.80  CALCIUM  7.1*  --  6.7* 6.9* 7.0* 8.1*  MG 2.8*  --  2.5* 2.4 2.5* 2.5*  PHOS  --  4.7* 4.0 3.2 2.5 3.2   Liver Function Tests: Recent Labs  Lab 12/01/23 1307 12/05/23 0123 12/05/23 1705  12/06/23 0638 12/07/23 0555  AST 43* 78* 57* 51* 40  ALT 53* 109* 79* 71* 59*  ALKPHOS 191* 207* 163* 160* 166*  BILITOT 0.6 1.4* 0.8 0.8 1.0  PROT 8.4* 8.8* 7.0 7.2 7.5  ALBUMIN 4.5 4.7 3.6 3.7 3.7   CBG: No results for input(s): GLUCAP in the last 168 hours.  Discharge time spent: greater than 30 minutes.  Signed: Bertram Brocks, MD [facility] 12/07/2023

## 2023-12-08 ENCOUNTER — Other Ambulatory Visit (HOSPITAL_COMMUNITY): Payer: Self-pay

## 2023-12-08 ENCOUNTER — Inpatient Hospital Stay: Payer: MEDICAID | Admitting: Nurse Practitioner

## 2023-12-08 ENCOUNTER — Telehealth: Payer: Self-pay | Admitting: *Deleted

## 2023-12-08 ENCOUNTER — Encounter: Payer: Self-pay | Admitting: Nurse Practitioner

## 2023-12-08 ENCOUNTER — Telehealth: Payer: Self-pay

## 2023-12-08 ENCOUNTER — Ambulatory Visit: Payer: Self-pay | Admitting: Primary Care

## 2023-12-08 ENCOUNTER — Telehealth (INDEPENDENT_AMBULATORY_CARE_PROVIDER_SITE_OTHER): Payer: Self-pay | Admitting: Primary Care

## 2023-12-08 DIAGNOSIS — G893 Neoplasm related pain (acute) (chronic): Secondary | ICD-10-CM

## 2023-12-08 DIAGNOSIS — Z515 Encounter for palliative care: Secondary | ICD-10-CM

## 2023-12-08 DIAGNOSIS — R530 Neoplastic (malignant) related fatigue: Secondary | ICD-10-CM

## 2023-12-08 DIAGNOSIS — C7951 Secondary malignant neoplasm of bone: Secondary | ICD-10-CM

## 2023-12-08 DIAGNOSIS — R110 Nausea: Secondary | ICD-10-CM

## 2023-12-08 DIAGNOSIS — K5903 Drug induced constipation: Secondary | ICD-10-CM | POA: Diagnosis not present

## 2023-12-08 DIAGNOSIS — C50911 Malignant neoplasm of unspecified site of right female breast: Secondary | ICD-10-CM

## 2023-12-08 NOTE — Telephone Encounter (Signed)
 From [facility] call:  She is concerned that she continues to vomit and is not able to keep anything down, including her medications. She said she was up all night.  She has a scopolamine  patch on that she said she put on yesterday but it is not helping. She also stated that she has not had a BM.   She has a telephone visit with oncology this afternoon and will be able to discuss her concerns with the provider at that time.  She also wants to see her PCP but there is not an appointment available until 7/3 and she wants to be seen sooner. I told her that I would check with Ms Denece Finger, NP to see if there is any way to fit her into the schedule before 7/3.

## 2023-12-08 NOTE — Transitions of Care (Post Inpatient/ED Visit) (Signed)
 12/08/2023  Name: Chelsea Hansen MRN: [id number] DOB: July 08, 1970  Today's TOC FU Call Status: Today's TOC FU Call Status:: Successful TOC FU Call Completed TOC FU Call Complete Date: 12/08/23 Patient's Name and Date of Birth confirmed.  Transition Care Management Follow-up Telephone Call Date of Discharge: 12/07/23 Discharge Facility: [facility]) Type of Discharge: Inpatient Admission Primary Inpatient Discharge Diagnosis:: AKI How have you been since you were released from the hospital?: Same Any questions or concerns?: Yes Patient Questions/Concerns:: Patient continues to experience N/V. Vomiting three times today. Has not been able to keep juice down.  Items Reviewed: Did you receive and understand the discharge instructions provided?: Yes Medications obtained,verified, and reconciled?: Yes (Medications Reviewed) Any new allergies since your discharge?: No Dietary orders reviewed?: NA Type of Diet Ordered:: Did not review heart healthy diet today. Discussed increasing fluid intake by taking small sips, and eating small amounts of bland foods every 2 hours Do you have support at home?: Yes (Family provides support) People in Home [RPT]: alone Name of Support/Comfort Primary Source: Patient has 5 children that are supportive and helpful  Medications Reviewed Today: Medications Reviewed Today     Reviewed by Aura Leeds, RN (Registered Nurse) on 12/08/23 at 1518  Med List Status: <None>   Medication Order Taking? Sig Documenting Provider Last Dose Status Informant  abemaciclib  (VERZENIO ) 50 MG tablet [id number]  Take 1 tablet (50 mg total) by mouth 2 (two) times daily. [name], [name], MD  Active            Med Note (CRUTHIS, CHLOE C   Tue Dec 06, 2023  8:25 AM) LF 05/25 FOR 28 DS   albuterol  (VENTOLIN  HFA) 108 (90 Base) MCG/ACT inhaler [id number] Yes Inhale 2 puffs by mouth into the lungs every 6 hours as needed for wheezing or shortness of breath. Marius Siemens, NP   Active Self           Med Note (CRUTHIS, CHLOE C   Tue Dec 06, 2023  8:25 AM) LF 05/25 FOR 25 DS   anastrozole  (ARIMIDEX ) 1 MG tablet [id number] Yes Take 1 tablet (1 mg total) by mouth daily. Percival Brace, NP  Active Self           Med Note Bobbi Burow, CHLOE C   Tue Dec 06, 2023  8:25 AM) LF 04/25 FOR 90 DS   calcium -vitamin D (OSCAL WITH D) 500-5 MG-MCG tablet [id number]  Take 1 tablet by mouth daily with supper.  Patient not taking: Reported on 12/08/2023   [name], [name], MD  Active Self           Med Note (CRUTHIS, CHLOE C   Tue Dec 06, 2023  8:26 AM) LF 11/24 FOR 90 DS   gabapentin  (NEURONTIN ) 300 MG capsule [id number]  Take 1 capsule (300 mg total) by mouth 3 (three) times daily.  Patient not taking: Reported on 12/08/2023   [name], [name], MD  Active Self           Med Note (CRUTHIS, CHLOE C   Tue Dec 06, 2023  8:26 AM) LF 05/25 FOR 30 DS   montelukast  (SINGULAIR ) 10 MG tablet [id number] Yes Take 1 tablet (10 mg total) by mouth at bedtime.  Patient taking differently: Take 10 mg by mouth at bedtime as needed (for asthma flares or allergic symptoms).   Marius Siemens, NP  Active Self           Med Note (CRUTHIS, CHLOE C  Tue Dec 06, 2023  8:26 AM) LF 04/25 FOR 90 DS   ondansetron  (ZOFRAN -ODT) 4 MG disintegrating tablet [id number]  Dissolve 1 tablet (4 mg total) by mouth every 8 (eight) hours as needed for nausea or vomiting.  Patient not taking: Reported on 12/08/2023   Kendrick Pax, PA-C  Active Self           Med Note (CRUTHIS, CHLOE C   Tue Dec 06, 2023  8:26 AM) LF 11/26/23 FOR 7 DS   oxyCODONE  (OXYCONTIN ) 10 mg 12 hr tablet [id number] Yes Take 1 tablet (10 mg total) by mouth every 12 (twelve) hours. [name], Giles Labrum, NP  Active            Med Note (CRUTHIS, CHLOE C   Tue Dec 06, 2023  8:27 AM) LF 11/29/23 FOR 30 DS   oxyCODONE -acetaminophen  (PERCOCET) 7.5-325 MG tablet [id number] Yes Take 1-2 tablets by mouth every 6 (six) hours as needed for severe pain (pain  score 7-10). [name], Giles Labrum, NP  Active            Med Note (CRUTHIS, CHLOE C   Tue Dec 06, 2023  8:27 AM) LF 12/02/23 FOR 13 DS   polyethylene glycol powder (GLYCOLAX /MIRALAX ) 17 GM/SCOOP powder [id number] Yes Mix 17 g in 4 oz of liquid and take by mouth 2 (two) times daily as needed. Marius Siemens, NP  Active            Med Note Verdie Gladden   Tue Dec 06, 2023  2:51 PM) LF 06/25 FOR 28 DS  potassium chloride  SA (KLOR-CON  M) 20 MEQ tablet [id number]  Take 2 tablets (40 mEq total) by mouth daily for 3 days.  Patient not taking: Reported on 12/08/2023   Loma Rising, MD  Active   prochlorperazine  (COMPAZINE ) 10 MG tablet [id number] Yes Take 1 tablet (10 mg total) by mouth every 6 (six) hours as needed for nausea or vomiting. [name], Hurman Maiden, MD  Active   propranolol  (INDERAL ) 10 MG tablet [id number]  Take 1 tablet (10 mg total) by mouth 2 (two) times daily. Please call [phone] to schedule an appointment for future refills. Thank you. 3rd attempt. [name], [name], DO  Active Self           Med Note (CRUTHIS, CHLOE C   Tue Dec 06, 2023  8:29 AM) LF 01/25 FOR 15 DS   scopolamine  (TRANSDERM-SCOP) 1 MG/3DAYS [id number] Yes Place 1 patch (1.5 mg total) onto the skin every 3 (three) days as needed (Dizziness, nausea or vomiting). [name], Hurman Maiden, MD  Active   senna (SENOKOT) 8.6 MG TABS tablet [id number]  Take 2 tablets (17.2 mg total) by mouth at bedtime.  Patient not taking: Reported on 12/08/2023   Loma Rising, MD  Active Self           Med Note (CRUTHIS, CHLOE C   Tue Dec 06, 2023  8:29 AM) LF 01/25 FOR 30 DS   sennosides-docusate sodium  (SENOKOT-S) 8.6-50 MG tablet [id number]  Take 1 tablet by mouth daily.  Patient not taking: Reported on 12/08/2023   Marius Siemens, NP  Active            Med Note Bobbi Burow, Ether Hercules Dec 06, 2023  8:30 AM) LF 06/25 FOR 120 DS   Med List Note Althea Atkinson, RPH-CPP 07/07/22 0160): Abemaciclib  through [facility];   UDS 02/11/22   MR 05/22/22 Medication Agreement signed. LP  Hydrocodone   approved through 5 13 23             Home Care and Equipment/Supplies: Were Home Health Services Ordered?: No Any new equipment or medical supplies ordered?: No  Functional Questionnaire: Do you need assistance with bathing/showering or dressing?: No Do you need assistance with meal preparation?: No Do you need assistance with eating?: No Do you have difficulty maintaining continence: No Do you need assistance with getting out of bed/getting out of a chair/moving?: No Do you have difficulty managing or taking your medications?: No  Follow up appointments reviewed: PCP Follow-up appointment confirmed?: Yes Date of PCP follow-up appointment?: 12/13/23 Follow-up Provider: Jacqualine Mater, NP Specialist Hospital Follow-up appointment confirmed?: Yes Date of Specialist follow-up appointment?: 12/15/23 Follow-Up Specialty Provider:: Oncology Do you need transportation to your follow-up appointment?: No Do you understand care options if your condition(s) worsen?: Yes-patient verbalized understanding  SDOH Interventions Today    Flowsheet Row Most Recent Value  SDOH Interventions   Food Insecurity Interventions Intervention Not Indicated  Housing Interventions Intervention Not Indicated  Transportation Interventions Intervention Not Indicated  Utilities Interventions Intervention Not Indicated   RNCM contacted [employer] to connect patient to case management. Instructed to contact [facility] [phone] for tailored care management. RNCM left a detailed message regarding Ms. Giammona and recent hospital discharge, requesting CM connect with patient.  RNCM sent secure communication to PCP requesting an order for a shower chair.  Discussed previous communication with provider advising patient to return to the ED. Patient declines going to the ED at this time. She will attempt to eat noodles and see if she keeps the noodles down. RNCM  discussed using antiemetics as directed and advised patient to return to the ED if N/V continues.  Arna Better RN, BSN [place]  [facility] RN Care Manager [phone]

## 2023-12-08 NOTE — Progress Notes (Signed)
 Palliative Medicine [facility]  [phone] [phone]   Name: Chelsea Hansen Date: 12/08/2023 MRN: [id number]  DOB: 1970-08-29  Patient Care Team: Marius Siemens, NP as PCP - General (Internal Medicine) Jerryl Morin, DO as PCP - Cardiology (Cardiology) Retta Caster, MD as Consulting Physician (Radiation Oncology) Oza Blumenthal, MD as Consulting Physician (General Surgery) Cameron Cea, MD as Consulting Physician (Hematology and Oncology) [name], Giles Labrum, NP as Nurse Practitioner (Nurse Practitioner)   I connected with Chelsea Hansen on 12/08/23 at  1:00 PM EDT by phone and verified that I am speaking with the correct person using two identifiers.   I discussed the limitations, risks, security and privacy concerns of performing an evaluation and management service by telemedicine and the availability of in-person appointments. I also discussed with the patient that there may be a patient responsible charge related to this service. The patient expressed understanding and agreed to proceed.   Other persons participating in the visit and their role in the encounter: n/a   Patient's location: home  Provider's location: [facility]   Chief Complaint: follow up of symptom management    INTERVAL HISTORY: Chelsea Hansen is a 53 y.o. 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  MRI of L-spine (12/23) showed L3 pathologic fracture and significant lesion along S1 with sclerotic changes.  Palliative ask to see for symptom management and goals of care.   SOCIAL HISTORY:     reports that she has never smoked. She has never used smokeless  tobacco. She reports that she does not currently use alcohol after a past usage of about 7.0 standard drinks of alcohol per week. She reports that she does not use drugs.  ADVANCE DIRECTIVES:   CODE STATUS:   PAST MEDICAL HISTORY: Past Medical History:  Diagnosis Date   Allergy    seasonal allergies   Anxiety    on meds   Asthma    uses inhaler   Breast cancer (HCC) 2012   RIGHT lumpectomy   Cancer (HCC) 2022   RIGHT breast lump-dx 2022   Depression    on meds   DVT (deep venous thrombosis) (HCC) 2010   after hysterectomy   Family history of uterine cancer    GERD (gastroesophageal reflux disease)    with certain foods/OTC PRN meds   Headache(784.0)    History of radiation therapy    Bilateral breast- 09/03/21-11/02/21- Dr. Retta Caster   Hyperlipidemia    on meds   Hypertension    on meds   SVT (supraventricular tachycardia) (HCC)     ALLERGIES:  has no known allergies.  MEDICATIONS:  Current Outpatient Medications  Medication Sig Dispense Refill   [Paused] abemaciclib  (VERZENIO ) 50 MG tablet Take 1 tablet (50 mg total) by mouth 2 (two) times daily. 56 tablet 3   albuterol  (VENTOLIN  HFA) 108 (90 Base) MCG/ACT inhaler Inhale 2 puffs by mouth into the lungs every 6 hours as needed for wheezing or shortness of breath. 18 g 2   anastrozole  (ARIMIDEX ) 1 MG tablet Take 1 tablet (1 mg total) by mouth daily. 90 tablet 3   calcium -vitamin D (OSCAL WITH D) 500-5 MG-MCG tablet Take 1 tablet by mouth daily with supper. 90 tablet 2  gabapentin  (NEURONTIN ) 300 MG capsule Take 1 capsule (300 mg total) by mouth 3 (three) times daily. 90 capsule 6   montelukast  (SINGULAIR ) 10 MG tablet Take 1 tablet (10 mg total) by mouth at bedtime. (Patient taking differently: Take 10 mg by mouth at bedtime as needed (for asthma flares or allergic symptoms).) 90 tablet 3   ondansetron  (ZOFRAN -ODT) 4 MG disintegrating tablet Dissolve 1 tablet (4 mg total) by mouth every 8 (eight) hours as needed for  nausea or vomiting. (Patient taking differently: Take 4 mg by mouth every 8 (eight) hours as needed for nausea or vomiting (dissolve orally).) 20 tablet 0   oxyCODONE  (OXYCONTIN ) 10 mg 12 hr tablet Take 1 tablet (10 mg total) by mouth every 12 (twelve) hours. 60 tablet 0   oxyCODONE -acetaminophen  (PERCOCET) 7.5-325 MG tablet Take 1-2 tablets by mouth every 6 (six) hours as needed for severe pain (pain score 7-10). 102 tablet 0   polyethylene glycol powder (GLYCOLAX /MIRALAX ) 17 GM/SCOOP powder Mix 17 g in 4 oz of liquid and take by mouth 2 (two) times daily as needed. 3350 g 1   potassium chloride  SA (KLOR-CON  M) 20 MEQ tablet Take 2 tablets (40 mEq total) by mouth daily for 3 days. 6 tablet 0   prochlorperazine  (COMPAZINE ) 10 MG tablet Take 1 tablet (10 mg total) by mouth every 6 (six) hours as needed for nausea or vomiting. 30 tablet 1   [Paused] propranolol  (INDERAL ) 10 MG tablet Take 1 tablet (10 mg total) by mouth 2 (two) times daily. Please call [phone] to schedule an appointment for future refills. Thank you. 3rd attempt. 30 tablet 0   scopolamine  (TRANSDERM-SCOP) 1 MG/3DAYS Place 1 patch (1.5 mg total) onto the skin every 3 (three) days as needed (Dizziness, nausea or vomiting). 10 patch 12   senna (SENOKOT) 8.6 MG TABS tablet Take 2 tablets (17.2 mg total) by mouth at bedtime. 60 tablet 3   sennosides-docusate sodium  (SENOKOT-S) 8.6-50 MG tablet Take 1 tablet by mouth daily. 120 tablet 1   No current facility-administered medications for this visit.    VITAL SIGNS: There were no vitals taken for this visit. There were no vitals filed for this visit.   Estimated body mass index is 26.3 kg/m as calculated from the following:   Height as of 12/05/23: 5' 11 (1.803 m).   Weight as of 12/05/23: 188 lb 9.6 oz (85.5 kg).   PERFORMANCE STATUS (ECOG) : 1 - Symptomatic but completely ambulatory   IMPRESSION: I connected by phone with Chelsea Hansen for follow-up. Patient shares she has not had  a good past several days. Recently discharged from hospital on 6/18 due to intractable nausea, vomiting, seizure-like activity, constipation. She was also admitted on 6/5-6/7 for similar concerns. Continues to endorse persistent fatigue, nausea, and constipation despite medication use.   Patient reports she has taken Miralax , dulcolax, and senna with no movement. Recommended she drink half bottle of magnesium  citrate and if no bowel movement consume remaining half of bottle. She verbalized understanding. Continue with antiemetics as prescribed.   Pain controlled on current regimen. Patient is aware of importance of bowel regimen with opioid use. If constipation continues will discuss adjustments to regimen to decrease symptoms.   All questions answered and support provided.  Pain contract on file.  We will continue to closely monitor.  All questions answered and support provided.  PLAN: OxyContin  10mg  every 12 hours.  Continue Percocet every 6 hours as needed for breakthrough pain. Have been taking more often  due to absence of long-acting.  MiraLAX  twice daily for bowel regimen Senna daily Protonix  40 mg daily  Patient reports pain is well-controlled. I will plan to see patient back in 6-8 weeks in collaboration with her other oncology appointments. Patient knows to contact our office sooner if needed.  Patient expressed understanding and was in agreement with this plan. She also understands that She can call the clinic at any time with any questions, concerns, or complaints.     Any controlled substances utilized were prescribed in the context of palliative care. PDMP has been reviewed.    Visit consisted of counseling and education dealing with the complex and emotionally intense issues of symptom management and palliative care in the setting of serious and potentially life-threatening illness.  Dellia Ferguson, AGPCNP-BC  Palliative Medicine Team/[facility]

## 2023-12-08 NOTE — Telephone Encounter (Signed)
 Called pt to schedule appt before 7/3. Please call back or schedule an appt with Dr Denece Finger.

## 2023-12-08 NOTE — Telephone Encounter (Signed)
 FYI Only or Action Required?: Action required by provider: update on patient condition.  Patient was last seen in primary care on 11/30/2023 by Marius Siemens, NP. Called Nurse Triage reporting Vomiting. Symptoms began several days ago. Symptoms are: unchanged.  Triage Disposition: Go to ED Now (Notify PCP)  Patient/caregiver understands and will follow disposition?: Yes   Copied from CRM [id number]. Topic: Clinical - Red Word Triage >> Dec 08, 2023  2:42 PM Leory Rands wrote: Red Word that prompted transfer to Nurse - patient calling to report she was seen at the hospital yesterday prescribed Rx #: [id number]  scopolamine  (TRANSDERM-SCOP) 1 MG/3DAYS [[id number]]  Still vomitting and nauseous. Reason for Disposition  [1] Vomiting AND [2] contains red blood or black (coffee ground) material  (Exception: Few red streaks in vomit that only happened once.)  Answer Assessment - Initial Assessment Questions Patient discharged from hospital yesterday Throwing up since last night- more than 6 times Scopolamine  not helping Stomach pain Denies diarrhea- I have not pooped in three weeks Dizziness  Denies headache  Protocols used: Vomiting-A-AH

## 2023-12-08 NOTE — Transitions of Care (Post Inpatient/ED Visit) (Signed)
 12/08/2023  Name: Chelsea Hansen MRN: [id number] DOB: 12/30/1970  Today's TOC FU Call Status: Today's TOC FU Call Status:: Successful TOC FU Call Completed TOC FU Call Complete Date: 12/08/23 Patient's Name and Date of Birth confirmed.  Transition Care Management Follow-up Telephone Call Date of Discharge: 12/07/23 Discharge Facility: [facility] Sm[facility]) Type of Discharge: Inpatient Admission Primary Inpatient Discharge Diagnosis:: AKI How have you been since you were released from the hospital?: Same (She said she is still vomiting and has been up all night) Any questions or concerns?: Yes Patient Questions/Concerns:: She is concerned that she continues to vomit and is not able to keep anything down, including her medications. She said she was up all night.  She has a scopolamine  patch on that she said she put on yesterday but it is not helping and the instructions are to change every 3 days as needed. She also stated that she has not had a BM. Patient Questions/Concerns Addressed: Notified Provider of Patient Questions/Concerns (She has a telephone encounter this afternoon with oncology this afternoon and will share her concerns with the provider.  I told her that I would also notify her PCP)  Items Reviewed: Did you receive and understand the discharge instructions provided?: Yes Medications obtained,verified, and reconciled?: Partial Review Completed Reason for Partial Mediation Review: She said she has all of her medications except the potassium and she needs to pick that up at the pharmacy.  She said she had some potassium that she was given when she left the hospital.  She was in bed when I called and was not feeling well.  She said she did not have any questions about the med regime. Any new allergies since your discharge?: No Dietary orders reviewed?: No Type of Diet Ordered:: heart healthy, low sodium- we did not review this as she was not feeling well and has not been able to  tolerate any food since being home, she continues to vomit. Do you have support at home?: Yes People in Home [RPT]: alone Name of Support/Comfort Primary Source: She said has family that can come to assist he if needed  Medications Reviewed Today: Medications Reviewed Today   Medications were not reviewed in this encounter     Home Care and Equipment/Supplies: Were Home Health Services Ordered?: No Any new equipment or medical supplies ordered?: No  Functional Questionnaire: Do you need assistance with bathing/showering or dressing?: No Do you need assistance with meal preparation?: Yes (currently not eating) Do you need assistance with eating?: No Do you have difficulty maintaining continence: No Do you need assistance with getting out of bed/getting out of a chair/moving?: Yes (has a cane to use as needed) Do you have difficulty managing or taking your medications?: No (having dificulty keeping them down)  Follow up appointments reviewed: PCP Follow-up appointment confirmed?: No MD Provider Line [phone] Given: No (first available appt with PCP is 12/22/2023 but the patient wants to be seen sooner.  I told her that I would check with PCP to see if there is any way to get her on the schedule to be seen sooner.) Specialist Hospital Follow-up appointment confirmed?: Yes Date of Specialist follow-up appointment?: 12/08/23 Follow-Up Specialty Provider:: oncology- telephone visit today.  12/14/2023- GI Do you need transportation to your follow-up appointment?: No (She said her son will need to take her because her children took her car away from her.) Do you understand care options if your condition(s) worsen?: Yes-patient verbalized understanding    Jacinto Martins  Normie Becton, RN

## 2023-12-09 ENCOUNTER — Telehealth (INDEPENDENT_AMBULATORY_CARE_PROVIDER_SITE_OTHER): Payer: Self-pay | Admitting: Primary Care

## 2023-12-09 ENCOUNTER — Ambulatory Visit: Payer: Self-pay | Admitting: *Deleted

## 2023-12-09 ENCOUNTER — Other Ambulatory Visit: Payer: Self-pay

## 2023-12-09 NOTE — Telephone Encounter (Addendum)
 Copied from CRM [id number]. Topic: Clinical - Red Word Triage >> Dec 09, 2023 10:16 AM Chrystal Crape R wrote: Pt son Arnetta Lank [facility]) calling stating the pt, his mom is still vomiting after being triaged yesterday Reason for Disposition  [1] SEVERE vomiting (e.g., 6 or more times/day) AND [2] present > 8 hours (Exception: Patient sounds well, is drinking liquids, does not sound dehydrated, and vomiting has lasted less than 24 hours.)  Answer Assessment - Initial Assessment Questions 1. VOMITING SEVERITY: How many times have you vomited in the past 24 hours?     - MILD:  1 - 2 times/day    - MODERATE: 3 - 5 times/day, decreased oral intake without significant weight loss or symptoms of dehydration    - SEVERE: 6 or more times/day, vomits everything or nearly everything, with significant weight loss, symptoms of dehydration      Son calling in.  Arnetta Lank.    Not on DPR.   He is not with his mother.  Arnetta Lank said he is her POA.   I still let him know I could not talk with him regarding her health issues.    Pt was advised on 12/08/2023 to go to the ED but she did not go per Arnetta Lank.    She was triaged yesterday by a triage nurse.   He is going to try and connect her via a conference call.    2. ONSET: When did the vomiting begin?      She is vomiting and can't go to the bathroom per Arnetta Lank.   Pt got on line.   I've been vomiting for 3 weeks.   I just got out of the hospital on Wed. For the same thing   I can't stop throwing up.   I can't hold down anything, water, food nothing.  I didn't go to work today.     I've not had a BM.  Arnetta Lank then started filling in.   She has been in and out of the hospital for this.   Nothing keeps showing up.   Her labs are off, that's all.    She has an appt with GI doctor.   She also has a cancer dr.     Burney Carter have tried everything including stopping her cancer medications.   Her liver is in bad shape.   She had to get a lot of potassium while in the hospital.  All the tests are  coming back clear.   We don't know what is going on.   The GI provider is coming up on the 25th of this month.    She has stage 4 breast cancer.   She is getting injections for her bones because it's in her bones.   The cancer dr. Budd Cargo her off her medication to see if it would help with the vomiting.    Arnetta Lank said his name was on her paperwork where he could talk for her.      3. FLUIDS: What fluids or food have you vomited up today? Have you been able to keep any fluids down?     Unable to keep fluids or food down.    She has an appt to see Madelyn Schick, NP on 12/13/2023.   On 6/25 she sees her GI cr.   Then on 6/26 she sees her cancer dr.   The PET scan was done.    4. ABDOMEN PAIN: Are your having any abdomen pain? If Yes : How bad is it and  what does it feel like? (e.g., crampy, dull, intermittent, constant)       5. DIARRHEA: Is there any diarrhea? If Yes, ask: How many times today?      She is throwing up because she can't have a BM.   They gave her a suppository and enema in the hospital the time before when she was in the hospital.    She is in and out of the hospital.    Pt left the line to go vomit.    Arnetta Lank said she doesn't want to go back to the hospital.   It will be 4 times in 3 weeks she has been to the hospital.   They don't know what is going on with her.   I let him know she needed to go to the ED to have the dehydration treated and so they can check her blood work especially since she is having problems with her potassium being low.  Arnetta Lank again mentioned he doesn't know why his name is not on his mother's chart as her POA and as someone we can talk to regarding his mother's health information.   I let him know since his mother got on the line and gave permission it was ok for him to speak with me regarding her health information.  Pt mentioned before getting off the line that he should be on the paperwork too.   I let them know next time they were in the office  to be sure and ask that [name]'s name be added to the [facility].       6. CONTACTS: Is there anyone else in the family with the same symptoms?      No 7. CAUSE: What do you think is causing your vomiting?     Per Arnetta Lank, she can't go to the bathroom.   I clarified, she can't have a BM.   I asked how long it had been since she had a BM and he proceeded to tell me about them giving her an enema and a suppository when she was in the hospital time before last so never got a clear answer on the last BM.   8. HYDRATION STATUS: Any signs of dehydration? (e.g., dry mouth [not only dry lips], too weak to stand) When did you last urinate?     Yes   Been vomiting and unable to keep anything down even water for the last few days.    Just got out of the hospital Wed. 9. OTHER SYMPTOMS: Do you have any other symptoms? (e.g., fever, headache, vertigo, vomiting blood or coffee grounds, recent head injury)     Constipation, vomiting, repeatedly in and out of the hospital for vomiting. 10. PREGNANCY: Is there any chance you are pregnant? When was your last menstrual period?       N/A due to age  Protocols used: Vomiting-A-AH  Pt discharged from the hospital on Wed. Due to vomiting.   Son, Arnetta Lank called in and pt got on the line too.   She is still vomiting.  Been vomiting for 2 days now.   Can't keep anything down not even water.  Also c/o being unable to have a BM.   Pt was triaged by a triage nurse yesterday 12/08/2023 and advised to go to the ED.   She did not go.  I have also advised her to go to the ED since she has been vomiting for 2 days and cannot keep anything down.   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  She has upcoming appts with GI dr and cancer dr next week.   Pt had to get off the line to go vomit.  I was unable to get a commitment from [place] as to whether his mother will go to the ED or not. I have sent this information to Madelyn Schick,  NP.          Arlie Lain Only or Action Required?: FYI only for provider.  Patient was last seen in primary care on 11/30/2023 by Marius Siemens, NP. Called Nurse Triage reporting Vomiting. Symptoms began several days ago. Interventions attempted: Nothing. Symptoms are: rapidly worsening Continues to vomit for last 2 days.  Also c/o constipation.  Triage Disposition: Go to ED or PCP/Alternative with Approval  Patient/caregiver understands and will follow disposition?:  Unsure.   Did not commit one way or the other to going or not.    I called into the office and made them aware of the situation that pt did not commit one way or the other to going to the ED even though that is the disposition is to go.

## 2023-12-09 NOTE — Telephone Encounter (Signed)
 Will forward to provider

## 2023-12-09 NOTE — Telephone Encounter (Signed)
 E2C2 nurse called in asking if we were aware of pt vomiting and unable to keep anything down. Nurse wanted to make provider know this and also that the pt refused to go to the Emergency room. Please advise.

## 2023-12-10 ENCOUNTER — Emergency Department (HOSPITAL_COMMUNITY): Payer: MEDICAID

## 2023-12-10 ENCOUNTER — Inpatient Hospital Stay (HOSPITAL_COMMUNITY)
Admission: EM | Admit: 2023-12-10 | Discharge: 2023-12-14 | DRG: 368 | Disposition: A | Discharge status: Home / Self Care | Payer: MEDICAID | Attending: Internal Medicine | Admitting: Internal Medicine

## 2023-12-10 ENCOUNTER — Encounter (HOSPITAL_COMMUNITY): Payer: Self-pay | Admitting: Internal Medicine

## 2023-12-10 ENCOUNTER — Other Ambulatory Visit: Payer: Self-pay

## 2023-12-10 DIAGNOSIS — E873 Alkalosis: Secondary | ICD-10-CM | POA: Diagnosis present

## 2023-12-10 DIAGNOSIS — K449 Diaphragmatic hernia without obstruction or gangrene: Secondary | ICD-10-CM | POA: Diagnosis present

## 2023-12-10 DIAGNOSIS — R509 Fever, unspecified: Secondary | ICD-10-CM | POA: Diagnosis not present

## 2023-12-10 DIAGNOSIS — N1832 Chronic kidney disease, stage 3b: Secondary | ICD-10-CM | POA: Diagnosis present

## 2023-12-10 DIAGNOSIS — J45909 Unspecified asthma, uncomplicated: Secondary | ICD-10-CM | POA: Diagnosis present

## 2023-12-10 DIAGNOSIS — I959 Hypotension, unspecified: Secondary | ICD-10-CM | POA: Diagnosis present

## 2023-12-10 DIAGNOSIS — E878 Other disorders of electrolyte and fluid balance, not elsewhere classified: Secondary | ICD-10-CM | POA: Diagnosis not present

## 2023-12-10 DIAGNOSIS — K5903 Drug induced constipation: Secondary | ICD-10-CM | POA: Diagnosis present

## 2023-12-10 DIAGNOSIS — R4701 Aphasia: Secondary | ICD-10-CM | POA: Diagnosis present

## 2023-12-10 DIAGNOSIS — E876 Hypokalemia: Secondary | ICD-10-CM | POA: Diagnosis present

## 2023-12-10 DIAGNOSIS — Z8249 Family history of ischemic heart disease and other diseases of the circulatory system: Secondary | ICD-10-CM

## 2023-12-10 DIAGNOSIS — E860 Dehydration: Secondary | ICD-10-CM | POA: Diagnosis present

## 2023-12-10 DIAGNOSIS — K2980 Duodenitis without bleeding: Secondary | ICD-10-CM | POA: Diagnosis present

## 2023-12-10 DIAGNOSIS — K5900 Constipation, unspecified: Secondary | ICD-10-CM | POA: Diagnosis not present

## 2023-12-10 DIAGNOSIS — K222 Esophageal obstruction: Secondary | ICD-10-CM | POA: Diagnosis present

## 2023-12-10 DIAGNOSIS — Z9221 Personal history of antineoplastic chemotherapy: Secondary | ICD-10-CM

## 2023-12-10 DIAGNOSIS — K209 Esophagitis, unspecified without bleeding: Secondary | ICD-10-CM

## 2023-12-10 DIAGNOSIS — Z923 Personal history of irradiation: Secondary | ICD-10-CM

## 2023-12-10 DIAGNOSIS — G9341 Metabolic encephalopathy: Secondary | ICD-10-CM | POA: Diagnosis present

## 2023-12-10 DIAGNOSIS — C50919 Malignant neoplasm of unspecified site of unspecified female breast: Secondary | ICD-10-CM | POA: Diagnosis present

## 2023-12-10 DIAGNOSIS — Z1721 Progesterone receptor positive status: Secondary | ICD-10-CM

## 2023-12-10 DIAGNOSIS — G893 Neoplasm related pain (acute) (chronic): Secondary | ICD-10-CM | POA: Diagnosis present

## 2023-12-10 DIAGNOSIS — K219 Gastro-esophageal reflux disease without esophagitis: Secondary | ICD-10-CM | POA: Diagnosis present

## 2023-12-10 DIAGNOSIS — N189 Chronic kidney disease, unspecified: Secondary | ICD-10-CM | POA: Diagnosis not present

## 2023-12-10 DIAGNOSIS — N179 Acute kidney failure, unspecified: Secondary | ICD-10-CM | POA: Diagnosis present

## 2023-12-10 DIAGNOSIS — R933 Abnormal findings on diagnostic imaging of other parts of digestive tract: Secondary | ICD-10-CM

## 2023-12-10 DIAGNOSIS — T402X5A Adverse effect of other opioids, initial encounter: Secondary | ICD-10-CM | POA: Diagnosis present

## 2023-12-10 DIAGNOSIS — R112 Nausea with vomiting, unspecified: Secondary | ICD-10-CM

## 2023-12-10 DIAGNOSIS — B3781 Candidal esophagitis: Principal | ICD-10-CM | POA: Diagnosis present

## 2023-12-10 DIAGNOSIS — Z5941 Food insecurity: Secondary | ICD-10-CM

## 2023-12-10 DIAGNOSIS — D849 Immunodeficiency, unspecified: Secondary | ICD-10-CM | POA: Diagnosis present

## 2023-12-10 DIAGNOSIS — D630 Anemia in neoplastic disease: Secondary | ICD-10-CM | POA: Diagnosis present

## 2023-12-10 DIAGNOSIS — R290 Tetany: Secondary | ICD-10-CM | POA: Diagnosis not present

## 2023-12-10 DIAGNOSIS — Z79899 Other long term (current) drug therapy: Secondary | ICD-10-CM

## 2023-12-10 DIAGNOSIS — Z8049 Family history of malignant neoplasm of other genital organs: Secondary | ICD-10-CM

## 2023-12-10 DIAGNOSIS — F419 Anxiety disorder, unspecified: Secondary | ICD-10-CM | POA: Diagnosis present

## 2023-12-10 DIAGNOSIS — N2581 Secondary hyperparathyroidism of renal origin: Secondary | ICD-10-CM | POA: Diagnosis present

## 2023-12-10 DIAGNOSIS — F418 Other specified anxiety disorders: Secondary | ICD-10-CM | POA: Diagnosis not present

## 2023-12-10 DIAGNOSIS — I10 Essential (primary) hypertension: Secondary | ICD-10-CM | POA: Diagnosis not present

## 2023-12-10 DIAGNOSIS — I471 Supraventricular tachycardia, unspecified: Secondary | ICD-10-CM | POA: Diagnosis present

## 2023-12-10 DIAGNOSIS — F32A Depression, unspecified: Secondary | ICD-10-CM | POA: Diagnosis present

## 2023-12-10 DIAGNOSIS — G928 Other toxic encephalopathy: Secondary | ICD-10-CM

## 2023-12-10 DIAGNOSIS — Z79811 Long term (current) use of aromatase inhibitors: Secondary | ICD-10-CM

## 2023-12-10 DIAGNOSIS — Z1152 Encounter for screening for COVID-19: Secondary | ICD-10-CM

## 2023-12-10 DIAGNOSIS — K315 Obstruction of duodenum: Secondary | ICD-10-CM | POA: Diagnosis present

## 2023-12-10 DIAGNOSIS — K2970 Gastritis, unspecified, without bleeding: Secondary | ICD-10-CM | POA: Diagnosis present

## 2023-12-10 DIAGNOSIS — Z86718 Personal history of other venous thrombosis and embolism: Secondary | ICD-10-CM

## 2023-12-10 DIAGNOSIS — R9431 Abnormal electrocardiogram [ECG] [EKG]: Secondary | ICD-10-CM | POA: Diagnosis not present

## 2023-12-10 DIAGNOSIS — I493 Ventricular premature depolarization: Secondary | ICD-10-CM | POA: Diagnosis present

## 2023-12-10 DIAGNOSIS — E785 Hyperlipidemia, unspecified: Secondary | ICD-10-CM | POA: Diagnosis present

## 2023-12-10 DIAGNOSIS — R29810 Facial weakness: Secondary | ICD-10-CM | POA: Diagnosis present

## 2023-12-10 DIAGNOSIS — T182XXA Foreign body in stomach, initial encounter: Secondary | ICD-10-CM | POA: Diagnosis not present

## 2023-12-10 DIAGNOSIS — K2210 Ulcer of esophagus without bleeding: Secondary | ICD-10-CM | POA: Diagnosis not present

## 2023-12-10 DIAGNOSIS — K2950 Unspecified chronic gastritis without bleeding: Secondary | ICD-10-CM | POA: Diagnosis not present

## 2023-12-10 DIAGNOSIS — K2990 Gastroduodenitis, unspecified, without bleeding: Secondary | ICD-10-CM | POA: Diagnosis not present

## 2023-12-10 DIAGNOSIS — R000 Tachycardia, unspecified: Secondary | ICD-10-CM | POA: Insufficient documentation

## 2023-12-10 DIAGNOSIS — C50912 Malignant neoplasm of unspecified site of left female breast: Secondary | ICD-10-CM | POA: Diagnosis present

## 2023-12-10 DIAGNOSIS — Z803 Family history of malignant neoplasm of breast: Secondary | ICD-10-CM

## 2023-12-10 DIAGNOSIS — D649 Anemia, unspecified: Secondary | ICD-10-CM | POA: Diagnosis not present

## 2023-12-10 DIAGNOSIS — Z5986 Financial insecurity: Secondary | ICD-10-CM

## 2023-12-10 DIAGNOSIS — G894 Chronic pain syndrome: Secondary | ICD-10-CM | POA: Diagnosis present

## 2023-12-10 DIAGNOSIS — I129 Hypertensive chronic kidney disease with stage 1 through stage 4 chronic kidney disease, or unspecified chronic kidney disease: Secondary | ICD-10-CM | POA: Diagnosis present

## 2023-12-10 DIAGNOSIS — Z8041 Family history of malignant neoplasm of ovary: Secondary | ICD-10-CM

## 2023-12-10 DIAGNOSIS — R911 Solitary pulmonary nodule: Secondary | ICD-10-CM | POA: Diagnosis present

## 2023-12-10 DIAGNOSIS — C7951 Secondary malignant neoplasm of bone: Secondary | ICD-10-CM | POA: Diagnosis present

## 2023-12-10 DIAGNOSIS — C7981 Secondary malignant neoplasm of breast: Secondary | ICD-10-CM | POA: Diagnosis not present

## 2023-12-10 DIAGNOSIS — N183 Chronic kidney disease, stage 3 unspecified: Secondary | ICD-10-CM | POA: Diagnosis not present

## 2023-12-10 LAB — CBC
HCT: 43 % (ref 36.0–46.0)
Hemoglobin: 13.6 g/dL (ref 12.0–15.0)
MCH: 23.8 pg — ABNORMAL LOW (ref 26.0–34.0)
MCHC: 31.6 g/dL (ref 30.0–36.0)
MCV: 75.2 fL — ABNORMAL LOW (ref 80.0–100.0)
Platelets: 324 10*3/uL (ref 150–400)
RBC: 5.72 MIL/uL — ABNORMAL HIGH (ref 3.87–5.11)
RDW: 14.2 % (ref 11.5–15.5)
WBC: 10.3 10*3/uL (ref 4.0–10.5)
nRBC: 0 % (ref 0.0–0.2)

## 2023-12-10 LAB — COMPREHENSIVE METABOLIC PANEL WITH GFR
ALT: 96 U/L — ABNORMAL HIGH (ref 0–44)
AST: 98 U/L — ABNORMAL HIGH (ref 15–41)
Albumin: 4.4 g/dL (ref 3.5–5.0)
Alkaline Phosphatase: 164 U/L — ABNORMAL HIGH (ref 38–126)
Anion gap: 37 — ABNORMAL HIGH (ref 5–15)
BUN: 50 mg/dL — ABNORMAL HIGH (ref 6–20)
CO2: 27 mmol/L (ref 22–32)
Calcium: 6.7 mg/dL — ABNORMAL LOW (ref 8.9–10.3)
Chloride: 73 mmol/L — ABNORMAL LOW (ref 98–111)
Creatinine, Ser: 5.6 mg/dL — ABNORMAL HIGH (ref 0.44–1.00)
GFR, Estimated: 9 mL/min — ABNORMAL LOW (ref >60)
Glucose, Bld: 238 mg/dL — ABNORMAL HIGH (ref 70–99)
Potassium: 3.5 mmol/L (ref 3.5–5.1)
Sodium: 137 mmol/L (ref 135–145)
Total Bilirubin: 1 mg/dL (ref 0.0–1.2)
Total Protein: 9.1 g/dL — ABNORMAL HIGH (ref 6.5–8.1)

## 2023-12-10 LAB — DIFFERENTIAL
Abs Immature Granulocytes: 0.12 10*3/uL — ABNORMAL HIGH (ref 0.00–0.07)
Basophils Absolute: 0 10*3/uL (ref 0.0–0.1)
Basophils Relative: 0 %
Eosinophils Absolute: 0 10*3/uL (ref 0.0–0.5)
Eosinophils Relative: 0 %
Immature Granulocytes: 1 %
Lymphocytes Relative: 27 %
Lymphs Abs: 2.7 10*3/uL (ref 0.7–4.0)
Monocytes Absolute: 0.7 10*3/uL (ref 0.1–1.0)
Monocytes Relative: 7 %
Neutro Abs: 6.7 10*3/uL (ref 1.7–7.7)
Neutrophils Relative %: 65 %

## 2023-12-10 LAB — I-STAT CHEM 8, ED
BUN: 49 mg/dL — ABNORMAL HIGH (ref 6–20)
Calcium, Ion: 0.61 mmol/L — CL (ref 1.15–1.40)
Chloride: 76 mmol/L — ABNORMAL LOW (ref 98–111)
Creatinine, Ser: 5.3 mg/dL — ABNORMAL HIGH (ref 0.44–1.00)
Glucose, Bld: 231 mg/dL — ABNORMAL HIGH (ref 70–99)
HCT: 47 % — ABNORMAL HIGH (ref 36.0–46.0)
Hemoglobin: 16 g/dL — ABNORMAL HIGH (ref 12.0–15.0)
Potassium: 3.3 mmol/L — ABNORMAL LOW (ref 3.5–5.1)
Sodium: 132 mmol/L — ABNORMAL LOW (ref 135–145)
TCO2: 30 mmol/L (ref 22–32)

## 2023-12-10 LAB — PHOSPHORUS: Phosphorus: 4.8 mg/dL — ABNORMAL HIGH (ref 2.5–4.6)

## 2023-12-10 LAB — RESP PANEL BY RT-PCR (RSV, FLU A&B, COVID)  RVPGX2
Influenza A by PCR: NEGATIVE
Influenza B by PCR: NEGATIVE
Resp Syncytial Virus by PCR: NEGATIVE
SARS Coronavirus 2 by RT PCR: NEGATIVE

## 2023-12-10 LAB — LIPASE, BLOOD: Lipase: 45 U/L (ref 11–51)

## 2023-12-10 LAB — CBG MONITORING, ED: Glucose-Capillary: 251 mg/dL — ABNORMAL HIGH (ref 70–99)

## 2023-12-10 LAB — PROTIME-INR
INR: 1.1 (ref 0.8–1.2)
Prothrombin Time: 14.7 s (ref 11.4–15.2)

## 2023-12-10 LAB — APTT: aPTT: 28 s (ref 24–36)

## 2023-12-10 LAB — ETHANOL: Alcohol, Ethyl (B): <15 mg/dL (ref <15)

## 2023-12-10 LAB — CK: Total CK: 260 U/L — ABNORMAL HIGH (ref 38–234)

## 2023-12-10 LAB — MAGNESIUM: Magnesium: 2.3 mg/dL (ref 1.7–2.4)

## 2023-12-10 MED ORDER — ACETAMINOPHEN 650 MG RE SUPP
650.0000 mg | Freq: Once | RECTAL | Status: AC
  Administered 2023-12-11: 650 mg via RECTAL
  Filled 2023-12-10: qty 1

## 2023-12-10 MED ORDER — LACTATED RINGERS IV BOLUS
1000.0000 mL | Freq: Once | INTRAVENOUS | Status: AC
  Administered 2023-12-11: 1000 mL via INTRAVENOUS

## 2023-12-10 MED ORDER — CALCIUM GLUCONATE-NACL 2-0.675 GM/100ML-% IV SOLN: 2.0000 g | INTRAVENOUS | Status: DC

## 2023-12-10 MED ORDER — ACETAMINOPHEN 325 MG PO TABS
650.0000 mg | ORAL_TABLET | Freq: Once | ORAL | Status: DC | PRN
  Filled 2023-12-10: qty 2

## 2023-12-10 MED ORDER — ANASTROZOLE 1 MG PO TABS
1.0000 mg | ORAL_TABLET | Freq: Every day | ORAL | Status: DC
  Administered 2023-12-12 – 2023-12-14 (×3): 1 mg via ORAL
  Filled 2023-12-10 (×4): qty 1

## 2023-12-10 MED ORDER — LACTATED RINGERS IV SOLN: INTRAVENOUS | Status: DC

## 2023-12-10 MED ORDER — SODIUM CHLORIDE 0.9% FLUSH: 3.0000 mL | Freq: Once | INTRAVENOUS | Status: DC

## 2023-12-10 MED ORDER — METOPROLOL TARTRATE 5 MG/5ML IV SOLN: 2.5000 mg | INTRAVENOUS | Status: DC | PRN

## 2023-12-10 MED ORDER — TRIMETHOBENZAMIDE HCL 100 MG/ML IM SOLN
200.0000 mg | Freq: Three times a day (TID) | INTRAMUSCULAR | Status: DC | PRN
  Administered 2023-12-11 – 2023-12-13 (×2): 200 mg via INTRAMUSCULAR
  Filled 2023-12-10 (×3): qty 2

## 2023-12-10 MED ORDER — METRONIDAZOLE 500 MG/100ML IV SOLN
500.0000 mg | Freq: Once | INTRAVENOUS | Status: AC
  Administered 2023-12-10: 500 mg via INTRAVENOUS
  Filled 2023-12-10: qty 100

## 2023-12-10 MED ORDER — SODIUM CHLORIDE 0.9 % IV SOLN
2.0000 g | Freq: Once | INTRAVENOUS | Status: AC
  Administered 2023-12-10: 2 g via INTRAVENOUS
  Filled 2023-12-10: qty 12.5

## 2023-12-10 MED ORDER — SODIUM CHLORIDE 0.9% FLUSH
3.0000 mL | Freq: Two times a day (BID) | INTRAVENOUS | Status: DC
Start: 2023-12-10 — End: 2023-12-11

## 2023-12-10 MED ORDER — LACTATED RINGERS IV BOLUS
1000.0000 mL | Freq: Once | INTRAVENOUS | Status: AC
  Administered 2023-12-10: 1000 mL via INTRAVENOUS

## 2023-12-10 MED ORDER — SODIUM CHLORIDE 0.9% FLUSH: 3.0000 mL | Freq: Two times a day (BID) | INTRAVENOUS | Status: DC

## 2023-12-10 MED ORDER — HEPARIN SODIUM (PORCINE) 5000 UNIT/ML IJ SOLN
5000.0000 [IU] | Freq: Three times a day (TID) | INTRAMUSCULAR | Status: DC
  Administered 2023-12-11: 5000 [IU] via SUBCUTANEOUS
  Filled 2023-12-10: qty 1

## 2023-12-10 MED ORDER — CALCIUM GLUCONATE-NACL 1-0.675 GM/50ML-% IV SOLN
1.0000 g | Freq: Once | INTRAVENOUS | Status: DC
  Filled 2023-12-10: qty 50

## 2023-12-10 MED ORDER — SODIUM CHLORIDE 0.9 % IV SOLN: 250.0000 mL | INTRAVENOUS | Status: DC | PRN

## 2023-12-10 MED ORDER — VANCOMYCIN HCL IN DEXTROSE 1-5 GM/200ML-% IV SOLN
1000.0000 mg | Freq: Once | INTRAVENOUS | Status: DC
  Filled 2023-12-10: qty 200

## 2023-12-10 MED ORDER — OYSTER SHELL CALCIUM/D3 500-5 MG-MCG PO TABS
1.0000 | ORAL_TABLET | Freq: Three times a day (TID) | ORAL | Status: DC
  Administered 2023-12-11 – 2023-12-12 (×4): 1 via ORAL
  Filled 2023-12-10 (×5): qty 1

## 2023-12-10 MED ORDER — SODIUM CHLORIDE 0.9% FLUSH: 3.0000 mL | INTRAVENOUS | Status: DC | PRN

## 2023-12-10 MED ORDER — VANCOMYCIN HCL 1500 MG/300ML IV SOLN
1500.0000 mg | Freq: Once | INTRAVENOUS | Status: AC
  Administered 2023-12-10: 1500 mg via INTRAVENOUS
  Filled 2023-12-10: qty 300

## 2023-12-10 MED ORDER — CALCIUM GLUCONATE-NACL 1-0.675 GM/50ML-% IV SOLN
1.0000 g | Freq: Once | INTRAVENOUS | Status: AC
  Administered 2023-12-10: 1000 mg via INTRAVENOUS

## 2023-12-10 MED ORDER — OYSTER SHELL CALCIUM/D3 500-5 MG-MCG PO TABS: 1.0000 | ORAL_TABLET | Freq: Every day | ORAL | Status: DC

## 2023-12-10 MED ORDER — CALCIUM GLUCONATE-NACL 1-0.675 GM/50ML-% IV SOLN
1.0000 g | INTRAVENOUS | Status: AC
  Administered 2023-12-11: 1000 mg via INTRAVENOUS
  Filled 2023-12-10: qty 50

## 2023-12-10 MED ORDER — SENNOSIDES-DOCUSATE SODIUM 8.6-50 MG PO TABS: 1.0000 | ORAL_TABLET | Freq: Every day | ORAL | Status: DC | PRN

## 2023-12-10 MED ORDER — OXYCODONE-ACETAMINOPHEN 7.5-325 MG PO TABS: 1.0000 | ORAL_TABLET | Freq: Four times a day (QID) | ORAL | Status: DC | PRN

## 2023-12-10 NOTE — Progress Notes (Signed)
 Pt being followed by [facility] for Sepsis protocol.

## 2023-12-10 NOTE — ED Provider Notes (Signed)
 [place] EMERGENCY DEPARTMENT AT [facility] Provider Note   CSN: [id number] Arrival date & time: 12/10/23  1958  An emergency department physician performed an initial assessment on this suspected stroke patient at 2000.  Patient presents with: Code Stroke   Chelsea Hansen is a 53 y.o. female.   HPI 53 year old female with a history of breast cancer, chronic pain, PSVT, recurrent vomiting presents as a code stroke.  History is initially from EMS as the patient is not speaking well.  Reportedly 2 hours prior to arrival she was normal and then now has been having trouble speaking and seemingly left-sided weakness according to EMS.  The speaking gradually worsened then improved with EMS.  After the initial evaluation in the CT scanner, when I saw the patient back in the room she is able to speak better and has been vomiting since yesterday.  She is also having abdominal pain and has been having spasms and contractures in her hands and twitching and pain to her face. She has had a fever over the past couple days as well over 101.  Prior to Admission medications   Medication Sig Start Date End Date Taking? Authorizing Provider  abemaciclib  (VERZENIO ) 50 MG tablet Take 1 tablet (50 mg total) by mouth 2 (two) times daily. 12/01/23   [name], [name], MD  albuterol  (VENTOLIN  HFA) 108 (90 Base) MCG/ACT inhaler Inhale 2 puffs by mouth into the lungs every 6 hours as needed for wheezing or shortness of breath. 03/18/23   [name], NP  anastrozole  (ARIMIDEX ) 1 MG tablet Take 1 tablet (1 mg total) by mouth daily. 04/18/23   Causey, Morna Pickle, NP  calcium -vitamin D (OSCAL WITH D) 500-5 MG-MCG tablet Take 1 tablet by mouth daily with supper. Patient not taking: Reported on 12/08/2023 06/24/22   [name], [name], MD  gabapentin  (NEURONTIN ) 300 MG capsule Take 1 capsule (300 mg total) by mouth 3 (three) times daily. Patient not taking: Reported on 12/08/2023 05/26/23   [name], [name], MD   montelukast  (SINGULAIR ) 10 MG tablet Take 1 tablet (10 mg total) by mouth at bedtime. Patient taking differently: Take 10 mg by mouth at bedtime as needed (for asthma flares or allergic symptoms). 05/01/23   [name], NP  ondansetron  (ZOFRAN -ODT) 4 MG disintegrating tablet Dissolve 1 tablet (4 mg total) by mouth every 8 (eight) hours as needed for nausea or vomiting. Patient not taking: Reported on 12/08/2023 11/23/23   Glendia Rocky SAILOR, PA-C  oxyCODONE  (OXYCONTIN ) 10 mg 12 hr tablet Take 1 tablet (10 mg total) by mouth every 12 (twelve) hours. 11/29/23   [name], Athena N, NP  oxyCODONE -acetaminophen  (PERCOCET) 7.5-325 MG tablet Take 1-2 tablets by mouth every 6 (six) hours as needed for severe pain (pain score 7-10). 12/01/23   [name], [name], NP  polyethylene glycol powder (GLYCOLAX /MIRALAX ) 17 GM/SCOOP powder Mix 17 g in 4 oz of liquid and take by mouth 2 (two) times daily as needed. 11/30/23   [name], NP  potassium chloride  SA (KLOR-CON  M) 20 MEQ tablet Take 2 tablets (40 mEq total) by mouth daily for 3 days. Patient not taking: Reported on 12/08/2023 12/07/23 12/10/23  Davia [name], MD  prochlorperazine  (COMPAZINE ) 10 MG tablet Take 1 tablet (10 mg total) by mouth every 6 (six) hours as needed for nausea or vomiting. 12/07/23   [name], [name], MD  propranolol  (INDERAL ) 10 MG tablet Take 1 tablet (10 mg total) by mouth 2 (two) times daily. Please call  [phone] to schedule an appointment for future refills. Thank you. 3rd attempt. 06/01/23   [name], [name], DO  scopolamine  (TRANSDERM-SCOP) 1 MG/3DAYS Place 1 patch (1.5 mg total) onto the skin every 3 (three) days as needed (Dizziness, nausea or vomiting). 12/07/23   [name], [name], MD  senna (SENOKOT) 8.6 MG TABS tablet Take 2 tablets (17.2 mg total) by mouth at bedtime. Patient not taking: Reported on 12/08/2023 06/30/22   [name], [name], MD  sennosides-docusate sodium  (SENOKOT-S) 8.6-50 MG tablet Take 1  tablet by mouth daily. Patient not taking: Reported on 12/08/2023 11/30/23   [name], NP    Allergies: Patient has no known allergies.    Review of Systems  Constitutional:  Positive for fever.  Gastrointestinal:  Positive for abdominal pain and vomiting.  Neurological:  Positive for weakness and headaches.    Updated Vital Signs BP 107/72   Pulse 90   Temp 100.2 F (37.9 C) (Rectal)   Resp (!) 22   Wt 81.4 kg   SpO2 99%   BMI 25.03 kg/m   Physical Exam Vitals and nursing note reviewed.  Constitutional:      Appearance: She is well-developed. She is ill-appearing.  HENT:     Head: Normocephalic and atraumatic.   Cardiovascular:     Rate and Rhythm: Regular rhythm. Tachycardia present.     Heart sounds: Normal heart sounds.  Pulmonary:     Effort: Pulmonary effort is normal.     Breath sounds: Normal breath sounds.  Abdominal:     Palpations: Abdomen is soft.     Tenderness: There is generalized abdominal tenderness.   Skin:    General: Skin is warm and dry.   Neurological:     Mental Status: She is alert.     Comments: Awake, alert, trying to speak but not able to articulate well at first. Later has clear speech. Has bilateral hand contractures/spasm.    (all labs ordered are listed, but only abnormal results are displayed) Labs Reviewed  CBC - Abnormal; Notable for the following components:      Result Value   RBC 5.72 (*)    MCV 75.2 (*)    MCH 23.8 (*)    All other components within normal limits  DIFFERENTIAL - Abnormal; Notable for the following components:   Abs Immature Granulocytes 0.12 (*)    All other components within normal limits  COMPREHENSIVE METABOLIC PANEL WITH GFR - Abnormal; Notable for the following components:   Chloride 73 (*)    Glucose, Bld 238 (*)    BUN 50 (*)    Creatinine, Ser 5.60 (*)    Calcium  6.7 (*)    Total Protein 9.1 (*)    AST 98 (*)    ALT 96 (*)    Alkaline Phosphatase 164 (*)    GFR, Estimated 9 (*)     Anion gap 37 (*)    All other components within normal limits  PHOSPHORUS - Abnormal; Notable for the following components:   Phosphorus 4.8 (*)    All other components within normal limits  CK - Abnormal; Notable for the following components:   Total CK 260 (*)    All other components within normal limits  I-STAT CHEM 8, ED - Abnormal; Notable for the following components:   Sodium 132 (*)    Potassium 3.3 (*)    Chloride 76 (*)    BUN 49 (*)    Creatinine, Ser 5.30 (*)    Glucose, Bld  231 (*)    Calcium , Ion 0.61 (*)    Hemoglobin 16.0 (*)    HCT 47.0 (*)    All other components within normal limits  CBG MONITORING, ED - Abnormal; Notable for the following components:   Glucose-Capillary 251 (*)    All other components within normal limits  RESP PANEL BY RT-PCR (RSV, FLU A&B, COVID)  RVPGX2  CULTURE, BLOOD (ROUTINE X 2)  CULTURE, BLOOD (ROUTINE X 2)  CULTURE, BLOOD (ROUTINE X 2)  CULTURE, BLOOD (ROUTINE X 2)  PROTIME-INR  APTT  ETHANOL  MAGNESIUM   LIPASE, BLOOD  CALCIUM , IONIZED  URINALYSIS, W/ REFLEX TO CULTURE (INFECTION SUSPECTED)  CBC  COMPREHENSIVE METABOLIC PANEL WITH GFR  BASIC METABOLIC PANEL WITH GFR  URINALYSIS, ROUTINE W REFLEX MICROSCOPIC  CREATININE, URINE, RANDOM  SODIUM, URINE, RANDOM  I-STAT CG4 LACTIC ACID, ED  I-STAT CG4 LACTIC ACID, ED    EKG: None  Radiology: CT ABDOMEN PELVIS WO CONTRAST Result Date: 12/10/2023 CLINICAL DATA:  Abdominal pain, vomiting. EXAM: CT ABDOMEN AND PELVIS WITHOUT CONTRAST TECHNIQUE: Multidetector CT imaging of the abdomen and pelvis was performed following the standard protocol without IV contrast. RADIATION DOSE REDUCTION: This exam was performed according to the departmental dose-optimization program which includes automated exposure control, adjustment of the mA and/or kV according to patient size and/or use of iterative reconstruction technique. COMPARISON:  0/17/2025. FINDINGS: Lower chest: No acute abnormality.  No pleural or pericardial effusion. Hepatobiliary: No focal liver abnormality is seen. No gallstones, gallbladder wall thickening, or biliary dilatation. Pancreas: Unremarkable. No pancreatic ductal dilatation or surrounding inflammatory changes. Spleen: Normal in size without focal abnormality. Adrenals/Urinary Tract: Adrenal glands are unremarkable. Kidneys are normal, without renal calculi, focal lesion, or hydronephrosis. Bladder is unremarkable. Stomach/Bowel: No bowel dilatation to suggest obstruction. Diffuse colonic diverticulosis. There is mucosal thickening in the hepatic flexure and possible apple-core lesion measuring 2.5 cm. This can be better evaluated endoscopically. Unremarkable appearance of the appendix. Vascular/Lymphatic: No significant vascular findings are present. No enlarged abdominal or pelvic lymph nodes. Reproductive: Status post hysterectomy. No adnexal masses. Other: No abdominal wall hernia or abnormality. No abdominopelvic ascites. Musculoskeletal: Diffuse osteoblastic changes. Stable pathologic compression deformity L3. IMPRESSION: 1. Diffuse osteoblastic changes consistent with metastatic disease. 2. Possible colonic mucosal lesion in the hepatic flexure. This can be better evaluated endoscopically. 3. Diverticulosis. Electronically Signed   By: Fonda Field M.D.   On: 12/10/2023 22:22   DG Chest Port 1 View Result Date: 12/10/2023 CLINICAL DATA:  Questionable sepsis - evaluate for abnormality EXAM: PORTABLE CHEST - 1 VIEW COMPARISON:  12/05/2023. FINDINGS: Cardiac silhouette is unremarkable. No pneumothorax or pleural effusion. The lungs are clear. The visualized skeletal structures are unremarkable. IMPRESSION: No acute cardiopulmonary process. Electronically Signed   By: Fonda Field M.D.   On: 12/10/2023 21:55   CT HEAD CODE STROKE WO CONTRAST Result Date: 12/10/2023 CLINICAL DATA:  Code stroke.  Acute neurologic deficit EXAM: CT HEAD WITHOUT CONTRAST TECHNIQUE:  Contiguous axial images were obtained from the base of the skull through the vertex without intravenous contrast. RADIATION DOSE REDUCTION: This exam was performed according to the departmental dose-optimization program which includes automated exposure control, adjustment of the mA and/or kV according to patient size and/or use of iterative reconstruction technique. COMPARISON:  12/05/2023 FINDINGS: Brain: There is no mass, hemorrhage or extra-axial collection. The size and configuration of the ventricles and extra-axial CSF spaces are normal. The brain parenchyma is normal, without evidence of acute or chronic infarction. Vascular: No abnormal  hyperdensity of the major intracranial arteries or dural venous sinuses. No intracranial atherosclerosis. Skull: The visualized skull base, calvarium and extracranial soft tissues are normal. Sinuses/Orbits: No fluid levels or advanced mucosal thickening of the visualized paranasal sinuses. No mastoid or middle ear effusion. The orbits are normal. ASPECTS [facility] Stroke Program Early CT Score) - Ganglionic level infarction (caudate, lentiform nuclei, internal capsule, insula, M1-M3 cortex): 7 - Supraganglionic infarction (M4-M6 cortex): 3 Total score (0-10 with 10 being normal): 10 IMPRESSION: 1. No acute intracranial abnormality. 2. ASPECTS is 10. These results were communicated to Dr. Lola Jernigan at 8:11 pm on 12/10/2023 by text page via the [facility] messaging system. Electronically Signed   By: Franky Stanford M.D.   On: 12/10/2023 20:14     .Critical Care  Performed by: Freddi Hamilton, MD Authorized by: Freddi Hamilton, MD   Critical care provider statement:    Critical care time (minutes):  60   Critical care time was exclusive of:  Separately billable procedures and treating other patients   Critical care was necessary to treat or prevent imminent or life-threatening deterioration of the following conditions:  CNS failure or compromise and metabolic crisis    Critical care was time spent personally by me on the following activities:  Development of treatment plan with patient or surrogate, discussions with consultants, evaluation of patient's response to treatment, examination of patient, ordering and review of laboratory studies, ordering and review of radiographic studies, ordering and performing treatments and interventions, pulse oximetry, re-evaluation of patient's condition and review of old charts    Medications Ordered in the ED  sodium chloride  flush (NS) 0.9 % injection 3 mL (3 mLs Intravenous Not Given 12/10/23 2040)  vancomycin  (VANCOREADY) IVPB 1500 mg/300 mL (1,500 mg Intravenous New Bag/Given 12/10/23 2244)  acetaminophen  (TYLENOL ) suppository 650 mg (has no administration in time range)  acetaminophen  (TYLENOL ) tablet 650 mg (has no administration in time range)  lactated ringers  bolus 1,000 mL (has no administration in time range)  oxyCODONE -acetaminophen  (PERCOCET) 7.5-325 MG per tablet 1-2 tablet (has no administration in time range)  anastrozole  (ARIMIDEX ) tablet 1 mg (has no administration in time range)  senna-docusate (Senokot-S) tablet 1 tablet (has no administration in time range)  calcium -vitamin D (OSCAL WITH D) 500-5 MG-MCG per tablet 1 tablet (has no administration in time range)  heparin  injection 5,000 Units (has no administration in time range)  lactated ringers  infusion (has no administration in time range)  sodium chloride  flush (NS) 0.9 % injection 3 mL (has no administration in time range)  sodium chloride  flush (NS) 0.9 % injection 3 mL (has no administration in time range)  sodium chloride  flush (NS) 0.9 % injection 3 mL (has no administration in time range)  0.9 %  sodium chloride  infusion (has no administration in time range)  trimethobenzamide (TIGAN) injection 200 mg (has no administration in time range)  calcium  gluconate 1 g/ 50 mL sodium chloride  IVPB (has no administration in time range)  lactated ringers   bolus 1,000 mL (0 mLs Intravenous Stopped 12/10/23 2237)  lactated ringers  bolus 1,000 mL (0 mLs Intravenous Stopped 12/10/23 2238)  ceFEPIme  (MAXIPIME ) 2 g in sodium chloride  0.9 % 100 mL IVPB (0 g Intravenous Stopped 12/10/23 2118)  metroNIDAZOLE  (FLAGYL ) IVPB 500 mg (0 mg Intravenous Stopped 12/10/23 2237)  calcium  gluconate 1 g/ 50 mL sodium chloride  IVPB (0 mg Intravenous Stopped 12/10/23 2118)  Medical Decision Making Amount and/or Complexity of Data Reviewed Independent Historian: EMS Labs: ordered.    Details: Significant hypocalcemia.  Acute kidney injury Radiology: ordered and independent interpretation performed.    Details: No head bleed ECG/medicine tests: ordered and independent interpretation performed.    Details: Tachycardia  Risk Prescription drug management. Decision regarding hospitalization.   Patient presents with strokelike symptoms.  Seems to be more related to hypocalcemia.  Was given calcium  gluconate as well as significant fluids for her acute kidney injury.  She is now dramatically better.  She was also given antibiotics for possible sepsis given her fever of unclear source.  She is no longer altered.  I do not think she needs an LP at this time as she is improving but do think she needs to be admitted for AKI and electrolyte replacement.  Discussed with Dr. Sundil for admission.     Final diagnoses:  Acute kidney injury [facility])  Hypocalcemia    ED Discharge Orders     None          Freddi Hamilton, MD 12/10/23 2317

## 2023-12-10 NOTE — H&P (Signed)
 History and Physical    Chelsea Hansen FMW:[id number] DOB: 03-23-1971 DOA: 12/10/2023  PCP: [name], NP   Patient coming from: Home   Chief Complaint:  Chief Complaint  Patient presents with   Code Stroke   ED TRIAGE note:Code Stroke  LNK 11 am - talked to son    Pt BIB [facility] from home d/t concerns for stroke. EMS reports aphasia worsening throughout transport and left sided facial droop. HR 150s, BP 172/108, RA 95%/. Has 18G LAC.   HPI:  Chelsea Hansen is a 53 y.o. female with medical history significant of CKD stage IIIa, history of recurrent hospital admission for intractable nausea vomiting with associated electrolyte derangement, supraventricular tachycardia, asthma, essential hypertension, hyperlipidemia, chronic pulmonary nodule, cancer related pain and metastatic breast cancer status post neoadjuvant chemotherapy, lumpectomy, SNL biopsy status post radiation currently on anastrozole  and abemaciclib  presented to emergency department accompanied by EMS with concern for stroke.  Patient son reported last known well 11 AM 6/21.  EMS reported aphasia worsening throughout transport and left-sided facial droop.  During my evaluation at the bedside patient denies any having facial droop and aphasia.  Reported that she has been just discharged from the hospital and very upset that her nausea vomiting has not been improved and has to come back again to the hospital for admission.  Patient denies any chest pain, palpitation, shortness of breath, diarrhea, fever, chill, cough, abdominal pain, and UTI symptoms.   ED Course:  At presentation to ED patient found to have elevated heart rate 142 and hypotensive blood pressure 86/62.  Heart rate has been improved with diet and blood pressure has been improved.  EKG showed sinus tachycardia heart rate 135 and premature ventricular complex.  Borderline prolonged QTc 491 ms.  CBG showing elevated blood glucose.  Normal pro time INR. CBC  normal WBC count stable H&H and normal platelet count. CMP showing low bicarb 73, elevated blood Kos 238, elevated creatinine 5.6, elevated AST/ALT/ALP (chronically elevated) elevated anion gap 37 normal bicarb 27.  Low calcium  6.7. Pending UA.  Blood cultures are in process.  Pending lactic acid level. Respiratory panel negative. Normal mag level 2.3.  Elevated Phos 4.8. Normal lipase level. Elevated CK 260. Pending ionized calcium  level.  CT head no acute intracranial abnormality. Chest x-ray no acute cardiopulmonary process. CT abdomen pelvis showing diffuse metastatic disease.Possible colonic mucosal lesion in the hepatic flexure. This can be better evaluated endoscopically. 3. Diverticulosis.  Neurology initially has been consulted as patient came as a code stroke.  However ED physician Dr. Veronia reported that while patient in the ED he had a good history from the patient.  Patient is reporting she was discharged from the hospital 6/18 and since the discharge she continues to have persistent nausea vomiting and poor oral intake.  She has been feeling lethargic.  In the ED further workup revealed hypocalcemia, prerenal acute kidney injury in the setting of persistent vomiting and poor oral intake.  Due to 1 episode of low-grade fever patient has been started on broad-spectrum antibiotic coverage.   After giving the IV calcium  gluconate spasm and dysarthria has been improved.  Per neurology due to hypocalcemia nuclear derangement which is causing initial muscle spasm and dysarthria.  At this point there is no further need for workup for TIA.    Hospitalist has been consulted for further evaluation management of acute kidney injury in the setting of intractable nausea, vomiting, hypocalcemia and fever of unknown origin.  Significant labs in the ED: Lab Orders         Resp panel by RT-PCR (RSV, Flu A&B, Covid) Anterior Nasal Swab         Blood Culture (routine x 2)          Protime-INR         APTT         CBC         Differential         Comprehensive metabolic panel         Ethanol         Calcium , ionized         Urinalysis, w/ Reflex to Culture (Infection Suspected) -Urine, Catheterized         Magnesium          Phosphorus         Lipase, blood         CK         CBC         Comprehensive metabolic panel         Basic metabolic panel         Urinalysis, Routine w reflex microscopic -Urine, Clean Catch         Creatinine, urine, random         Sodium, urine, random         Parathyroid hormone, intact (no Ca)         VITAMIN D 25 Hydroxy (Vit-D Deficiency, Fractures)         Lactic acid, plasma         I-stat chem 8, ED         CBG monitoring, ED         I-Stat Lactic Acid, ED       Review of Systems:  Review of Systems  Constitutional:  Negative for chills, fever, malaise/fatigue and weight loss.  Respiratory:  Negative for cough, sputum production, shortness of breath and wheezing.   Cardiovascular:  Negative for chest pain, palpitations and claudication.  Gastrointestinal:  Positive for constipation, nausea and vomiting. Negative for abdominal pain and diarrhea.  Genitourinary:  Negative for dysuria, frequency and urgency.  Musculoskeletal:  Negative for back pain, joint pain, myalgias and neck pain.  Neurological:  Negative for dizziness, tingling, tremors, sensory change, speech change, focal weakness, seizures, loss of consciousness, weakness and headaches.  Psychiatric/Behavioral:  The patient is not nervous/anxious.     Past Medical History:  Diagnosis Date   Allergy    seasonal allergies   Anxiety    on meds   Asthma    uses inhaler   Breast cancer (HCC) 2012   RIGHT lumpectomy   Cancer (HCC) 2022   RIGHT breast lump-dx 2022   Depression    on meds   DVT (deep venous thrombosis) (HCC) 2010   after hysterectomy   Family history of uterine cancer    GERD (gastroesophageal reflux disease)    with certain foods/OTC PRN  meds   Headache(784.0)    History of radiation therapy    Bilateral breast- 09/03/21-11/02/21- Dr. Lynwood Nasuti   Hyperlipidemia    on meds   Hypertension    on meds   SVT (supraventricular tachycardia) [facility])     Past Surgical History:  Procedure Laterality Date   ABDOMINAL HYSTERECTOMY     AXILLARY SENTINEL NODE BIOPSY Left 07/09/2021   Procedure: LEFT AXILLARY SENTINEL NODE BIOPSY;  Surgeon: [name], MD;  Location: [facility];  Service: General;  Laterality: Left;   BIOPSY  06/29/2022   Procedure: BIOPSY;  Surgeon: Legrand Victory LITTIE DOUGLAS, MD;  Location: [facility];  Service: Gastroenterology;;   BREAST EXCISIONAL BIOPSY Right 09/16/2009   BREAST LUMPECTOMY WITH RADIOACTIVE SEED LOCALIZATION Bilateral 07/09/2021   Procedure: BILATERAL BREAST LUMPECTOMY WITH RADIOACTIVE SEED LOCALIZATION;  Surgeon: [name], MD;  Location: [facility];  Service: General;  Laterality: Bilateral;   BREAST SURGERY     lumpectomy   ESOPHAGOGASTRODUODENOSCOPY (EGD) WITH PROPOFOL  N/A 06/29/2022   Procedure: ESOPHAGOGASTRODUODENOSCOPY (EGD) WITH PROPOFOL ;  Surgeon: Legrand Victory LITTIE DOUGLAS, MD;  Location: [facility];  Service: Gastroenterology;  Laterality: N/A;   PORT-A-CATH REMOVAL Left 07/09/2021   Procedure: REMOVAL PORT-A-CATH;  Surgeon: [name], MD;  Location: [facility];  Service: General;  Laterality: Left;   PORTACATH PLACEMENT Left 01/26/2021   Procedure: INSERTION PORT-A-CATH;  Surgeon: [name], MD;  Location: [facility];  Service: General;  Laterality: Left;   RADIOACTIVE SEED GUIDED AXILLARY SENTINEL LYMPH NODE Right 07/09/2021   Procedure: RADIOACTIVE SEED GUIDED RIGHT AXILLARY SENTINEL LYMPH NODE DISSECTION;  Surgeon: [name], MD;  Location: [facility];  Service: General;  Laterality: Right;   TUBAL LIGATION       reports that she has never smoked. She has never used smokeless tobacco. She reports that  she does not currently use alcohol after a past usage of about 7.0 standard drinks of alcohol per week. She reports that she does not use drugs.  No Known Allergies  Family History  Problem Relation Age of Onset   Breast cancer Mother        46s   Hypertension Father    Uterine cancer Maternal Aunt    Ovarian cancer Maternal Aunt    Breast cancer Maternal Aunt        24s   Breast cancer Cousin 30   Uterine cancer Cousin 71   Colon polyps Neg Hx    Colon cancer Neg Hx    Esophageal cancer Neg Hx    Stomach cancer Neg Hx     Prior to Admission medications   Medication Sig Start Date End Date Taking? Authorizing Provider  abemaciclib  (VERZENIO ) 50 MG tablet Take 1 tablet (50 mg total) by mouth 2 (two) times daily. 12/01/23   [name], [name], MD  albuterol  (VENTOLIN  HFA) 108 (90 Base) MCG/ACT inhaler Inhale 2 puffs by mouth into the lungs every 6 hours as needed for wheezing or shortness of breath. 03/18/23   [name], NP  anastrozole  (ARIMIDEX ) 1 MG tablet Take 1 tablet (1 mg total) by mouth daily. 04/18/23   Causey, Morna Pickle, NP  calcium -vitamin D (OSCAL WITH D) 500-5 MG-MCG tablet Take 1 tablet by mouth daily with supper. Patient not taking: Reported on 12/08/2023 06/24/22   [name], [name], MD  gabapentin  (NEURONTIN ) 300 MG capsule Take 1 capsule (300 mg total) by mouth 3 (three) times daily. Patient not taking: Reported on 12/08/2023 05/26/23   [name], [name], MD  montelukast  (SINGULAIR ) 10 MG tablet Take 1 tablet (10 mg total) by mouth at bedtime. Patient taking differently: Take 10 mg by mouth at bedtime as needed (for asthma flares or allergic symptoms). 05/01/23   [name], NP  ondansetron  (ZOFRAN -ODT) 4 MG disintegrating tablet Dissolve 1 tablet (4 mg total) by mouth every 8 (eight) hours as needed for nausea or vomiting. Patient not taking: Reported on 12/08/2023 11/23/23   Glendia Rocky SAILOR, PA-C  oxyCODONE  (OXYCONTIN ) 10  mg 12 hr tablet Take 1 tablet (10 mg  total) by mouth every 12 (twelve) hours. 11/29/23   [name], Athena N, NP  oxyCODONE -acetaminophen  (PERCOCET) 7.5-325 MG tablet Take 1-2 tablets by mouth every 6 (six) hours as needed for severe pain (pain score 7-10). 12/01/23   [name], [name], NP  polyethylene glycol powder (GLYCOLAX /MIRALAX ) 17 GM/SCOOP powder Mix 17 g in 4 oz of liquid and take by mouth 2 (two) times daily as needed. 11/30/23   [name], NP  potassium chloride  SA (KLOR-CON  M) 20 MEQ tablet Take 2 tablets (40 mEq total) by mouth daily for 3 days. Patient not taking: Reported on 12/08/2023 12/07/23 12/10/23  Davia [name], MD  prochlorperazine  (COMPAZINE ) 10 MG tablet Take 1 tablet (10 mg total) by mouth every 6 (six) hours as needed for nausea or vomiting. 12/07/23   [name], [name], MD  propranolol  (INDERAL ) 10 MG tablet Take 1 tablet (10 mg total) by mouth 2 (two) times daily. Please call [phone] to schedule an appointment for future refills. Thank you. 3rd attempt. 06/01/23   [name], [name], DO  scopolamine  (TRANSDERM-SCOP) 1 MG/3DAYS Place 1 patch (1.5 mg total) onto the skin every 3 (three) days as needed (Dizziness, nausea or vomiting). 12/07/23   [name], [name], MD  senna (SENOKOT) 8.6 MG TABS tablet Take 2 tablets (17.2 mg total) by mouth at bedtime. Patient not taking: Reported on 12/08/2023 06/30/22   [name], [name], MD  sennosides-docusate sodium  (SENOKOT-S) 8.6-50 MG tablet Take 1 tablet by mouth daily. Patient not taking: Reported on 12/08/2023 11/30/23   [name], NP     Physical Exam: Vitals:   12/11/23 0030 12/11/23 0045 12/11/23 0100 12/11/23 0115  BP: 119/81 114/76 122/82   Pulse: 74 71 78 73  Resp: 18 18 18 16   Temp:      TempSrc:      SpO2: 100% 100% 100% 100%  Weight:        Physical Exam Vitals and nursing note reviewed.  Constitutional:      Appearance: Normal appearance.  HENT:     Head: Normocephalic and atraumatic.     Mouth/Throat:     Mouth:  Mucous membranes are dry.   Eyes:     Pupils: Pupils are equal, round, and reactive to light.    Cardiovascular:     Rate and Rhythm: Normal rate and regular rhythm.     Pulses: Normal pulses.     Heart sounds: Normal heart sounds.  Pulmonary:     Effort: Pulmonary effort is normal.     Breath sounds: Normal breath sounds.  Abdominal:     Palpations: Abdomen is soft.     Tenderness: There is no abdominal tenderness. There is no guarding or rebound.   Musculoskeletal:     Cervical back: Neck supple.     Right lower leg: No edema.     Left lower leg: No edema.   Skin:    General: Skin is dry.     Capillary Refill: Capillary refill takes less than 2 seconds.   Neurological:     Mental Status: She is alert and oriented to person, place, and time.     Cranial Nerves: No cranial nerve deficit.     Sensory: No sensory deficit.     Motor: No weakness.     Coordination: Coordination normal.     Deep Tendon Reflexes: Reflexes normal.   Psychiatric:        Mood and Affect:  Mood normal.        Behavior: Behavior normal.        Thought Content: Thought content normal.        Judgment: Judgment normal.      Labs on Admission: I have personally reviewed following labs and imaging studies  CBC: Recent Labs  Lab 12/05/23 0123 12/05/23 1705 12/06/23 0638 12/07/23 0555 12/10/23 2004 12/10/23 2005  WBC 7.0 5.5 4.5 5.1 10.3  --   NEUTROABS 4.8 3.7 3.0 3.3 6.7  --   HGB 12.0 10.6* 10.3* 11.3* 13.6 16.0*  HCT 38.2 34.6* 33.8* 35.5* 43.0 47.0*  MCV 74.8* 77.4* 76.5* 75.1* 75.2*  --   PLT 350 253 220 209 324  --    Basic Metabolic Panel: Recent Labs  Lab 12/05/23 1416 12/05/23 1705 12/06/23 0638 12/07/23 0555 12/10/23 2004 12/10/23 2005 12/10/23 2048  NA 135 134* 132* 132* 137 132*  --   K 2.9* 2.6* 3.3* 3.2* 3.5 3.3*  --   CL 88* 86* 89* 92* 73* 76*  --   CO2 31 35* 29 27 27   --   --   GLUCOSE 88 109* 91 94 238* 231*  --   BUN 41* 38* 28* 20 50* 49*  --    CREATININE 1.74* 1.55* 1.04* 0.80 5.60* 5.30*  --   CALCIUM  6.7* 6.9* 7.0* 8.1* 6.7*  --   --   MG 2.5* 2.4 2.5* 2.5*  --   --  2.3  PHOS 4.0 3.2 2.5 3.2  --   --  4.8*   GFR: Estimated Creatinine Clearance: 14 mL/min (A) (by C-G formula based on SCr of 5.3 mg/dL (H)). Liver Function Tests: Recent Labs  Lab 12/05/23 0123 12/05/23 1705 12/06/23 0638 12/07/23 0555 12/10/23 2004  AST 78* 57* 51* 40 98*  ALT 109* 79* 71* 59* 96*  ALKPHOS 207* 163* 160* 166* 164*  BILITOT 1.4* 0.8 0.8 1.0 1.0  PROT 8.8* 7.0 7.2 7.5 9.1*  ALBUMIN 4.7 3.6 3.7 3.7 4.4   Recent Labs  Lab 12/05/23 0819 12/06/23 0635 12/10/23 2048  LIPASE 37 35 45   No results for input(s): AMMONIA in the last 168 hours. Coagulation Profile: Recent Labs  Lab 12/10/23 2004  INR 1.1   Cardiac Enzymes: Recent Labs  Lab 12/05/23 0531 12/10/23 2048  CKTOTAL  --  260*  TROPONINIHS 7  --    BNP (last 3 results) No results for input(s): BNP in the last 8760 hours. HbA1C: No results for input(s): HGBA1C in the last 72 hours. CBG: Recent Labs  Lab 12/10/23 2000  GLUCAP 251*   Lipid Profile: No results for input(s): CHOL, HDL, LDLCALC, TRIG, CHOLHDL, LDLDIRECT in the last 72 hours. Thyroid  Function Tests: No results for input(s): TSH, T4TOTAL, FREET4, T3FREE, THYROIDAB in the last 72 hours. Anemia Panel: No results for input(s): VITAMINB12, FOLATE, FERRITIN, TIBC, IRON, RETICCTPCT in the last 72 hours. Urine analysis:    Component Value Date/Time   COLORURINE YELLOW 11/23/2023 1612   APPEARANCEUR CLEAR 11/23/2023 1612   LABSPEC <1.005 (L) 11/23/2023 1612   PHURINE 7.0 11/23/2023 1612   GLUCOSEU NEGATIVE 11/23/2023 1612   HGBUR NEGATIVE 11/23/2023 1612   BILIRUBINUR NEGATIVE 11/23/2023 1612   KETONESUR NEGATIVE 11/23/2023 1612   PROTEINUR NEGATIVE 11/23/2023 1612   UROBILINOGEN 0.2 11/01/2013 1246   NITRITE NEGATIVE 11/23/2023 1612   LEUKOCYTESUR NEGATIVE  11/23/2023 1612    Radiological Exams on Admission: I have personally reviewed images CT ABDOMEN PELVIS WO CONTRAST Result Date:  12/10/2023 CLINICAL DATA:  Abdominal pain, vomiting. EXAM: CT ABDOMEN AND PELVIS WITHOUT CONTRAST TECHNIQUE: Multidetector CT imaging of the abdomen and pelvis was performed following the standard protocol without IV contrast. RADIATION DOSE REDUCTION: This exam was performed according to the departmental dose-optimization program which includes automated exposure control, adjustment of the mA and/or kV according to patient size and/or use of iterative reconstruction technique. COMPARISON:  0/17/2025. FINDINGS: Lower chest: No acute abnormality. No pleural or pericardial effusion. Hepatobiliary: No focal liver abnormality is seen. No gallstones, gallbladder wall thickening, or biliary dilatation. Pancreas: Unremarkable. No pancreatic ductal dilatation or surrounding inflammatory changes. Spleen: Normal in size without focal abnormality. Adrenals/Urinary Tract: Adrenal glands are unremarkable. Kidneys are normal, without renal calculi, focal lesion, or hydronephrosis. Bladder is unremarkable. Stomach/Bowel: No bowel dilatation to suggest obstruction. Diffuse colonic diverticulosis. There is mucosal thickening in the hepatic flexure and possible apple-core lesion measuring 2.5 cm. This can be better evaluated endoscopically. Unremarkable appearance of the appendix. Vascular/Lymphatic: No significant vascular findings are present. No enlarged abdominal or pelvic lymph nodes. Reproductive: Status post hysterectomy. No adnexal masses. Other: No abdominal wall hernia or abnormality. No abdominopelvic ascites. Musculoskeletal: Diffuse osteoblastic changes. Stable pathologic compression deformity L3. IMPRESSION: 1. Diffuse osteoblastic changes consistent with metastatic disease. 2. Possible colonic mucosal lesion in the hepatic flexure. This can be better evaluated endoscopically. 3.  Diverticulosis. Electronically Signed   By: Fonda Field M.D.   On: 12/10/2023 22:22   DG Chest Port 1 View Result Date: 12/10/2023 CLINICAL DATA:  Questionable sepsis - evaluate for abnormality EXAM: PORTABLE CHEST - 1 VIEW COMPARISON:  12/05/2023. FINDINGS: Cardiac silhouette is unremarkable. No pneumothorax or pleural effusion. The lungs are clear. The visualized skeletal structures are unremarkable. IMPRESSION: No acute cardiopulmonary process. Electronically Signed   By: Fonda Field M.D.   On: 12/10/2023 21:55   CT HEAD CODE STROKE WO CONTRAST Result Date: 12/10/2023 CLINICAL DATA:  Code stroke.  Acute neurologic deficit EXAM: CT HEAD WITHOUT CONTRAST TECHNIQUE: Contiguous axial images were obtained from the base of the skull through the vertex without intravenous contrast. RADIATION DOSE REDUCTION: This exam was performed according to the departmental dose-optimization program which includes automated exposure control, adjustment of the mA and/or kV according to patient size and/or use of iterative reconstruction technique. COMPARISON:  12/05/2023 FINDINGS: Brain: There is no mass, hemorrhage or extra-axial collection. The size and configuration of the ventricles and extra-axial CSF spaces are normal. The brain parenchyma is normal, without evidence of acute or chronic infarction. Vascular: No abnormal hyperdensity of the major intracranial arteries or dural venous sinuses. No intracranial atherosclerosis. Skull: The visualized skull base, calvarium and extracranial soft tissues are normal. Sinuses/Orbits: No fluid levels or advanced mucosal thickening of the visualized paranasal sinuses. No mastoid or middle ear effusion. The orbits are normal. ASPECTS [facility] Stroke Program Early CT Score) - Ganglionic level infarction (caudate, lentiform nuclei, internal capsule, insula, M1-M3 cortex): 7 - Supraganglionic infarction (M4-M6 cortex): 3 Total score (0-10 with 10 being normal): 10 IMPRESSION: 1.  No acute intracranial abnormality. 2. ASPECTS is 10. These results were communicated to Dr. Lola Jernigan at 8:11 pm on 12/10/2023 by text page via the [facility] messaging system. Electronically Signed   By: Franky Stanford M.D.   On: 12/10/2023 20:14     EKG: My personal interpretation of EKG shows: Supraventricular tachycardia heart rate 135.    Assessment/Plan: Principal Problem:   Acute kidney injury superimposed on chronic kidney disease (HCC) Active Problems:   Hypocalcemia  Fever of unknown origin   Breast cancer (HCC)   Chronic pain syndrome (breast cancer)   Supraventricular tachycardia (HCC)   HLD (hyperlipidemia)   Essential hypertension   Intractable nausea and vomiting   QT prolongation   Sinus tachycardia    Assessment and Plan: Acute kidney injury superimposed CKD stage IIIb -Present emergency department via EMS as a code stroke patient.  Last known well 11 AM per patient's son.  She was discharged from the hospital yesterday while she has been admitted for intractable nausea vomiting with electrolyte derangement.  EMS reported patient had dysarthria and facial droop however in the ED after giving the calcium  gluconate muscle spasm and dysarthria completely resolved.  Patient reported that since discharge she continues to have persistent nausea and vomiting and poor leg intake. - CT head ruled out CVA.  Discussed with neurology.  At this time there is no concern for CVA or TIA rather than patient has symptoms initially in the setting of electrolyte derangement/muscle spasm. - At presentation to ED patient found to have heart rate above 142 which improved to 90.  Initially hypotensive blood pressure 86/62 improved to 107/72 after giving 2 L of LR bolus in the ED.  Pending lactic acid level blood culture, UA.  Chest x-ray no evidence of acute cardiopulmonary process.  CT abdomen pelvis showed Diffuse osteoblastic changes consistent with metastatic disease. 2. Possible colonic  mucosal lesion in the hepatic flexure. This can be better evaluated endoscopically. 3. Diverticulosis. -CMP showed elevated creatinine 5.6, (baseline creatinine around 1-3), elevated anion gap 37.  No evidence of chronic transaminitis.  Elevated BUN 50 low chloride 73, normal sodium 137, normal potassium of 3.5.  Low calcium  6.7.  Pending ionized calcium  level.  Normal Phos, mag and lipase level.  CK slightly elevated at 260. -Prerenal acute kidney injury in the setting of intractable nausea and vomiting and poor oral intake causing dehydration. -In the ED patient received total 3 L of LR bolus.  Continue maintenance fluid LR 125 cc/h. -Monitor improvement of renal function avoid nephrotoxic agent and renally adjust medications. -Consulted nephrology Dr. Prescilla to evaluate.  Hypocalcemia Dysarthria and muscle spasm in the setting of hypocalcemia-resolved -Hypocalcemia likely in the setting of metastatic breast cancer with bone metastasis that causing bony destruction. - Low calcium  6.7.  Initially patient has dysarthria and muscle spasm which has been improved after giving calcium  gluconate 1 g in the ED. - Giving another dose of calcium  gluconate 1 g. Checking vitamin D and PTH level - Increasing home dose of Os-Cal once daily to 3 times daily. -Will repeat calcium  level at midnight. - Continue cardiac monitoring  Fever of unknown origin -CBC no evidence of leukocytosis.  Patient has low-grade fever in the ED.  Pending lactic acid level.  Blood cultures, UA and urine culture is process.  Chest x-ray and CT abdomen pelvis unremarkable for any acute infectious process.  Respiratory panel negative - Given patient is immunocompromised and having low-grade fever continue broad-spectrum antibiotic coverage IV vancomycin  and cefepime .   -Need to follow-up with  blood culture, UA and urine culture result.  Intractable nausea and vomiting -Continue Tigan as needed.  History of metastatic  breast cancer -Patient follows outpatient oncology.  Chronic cancer-related pain -Continue Percocet as needed  Essential hypertension -Holding blood pressure regimen in the setting of hypotension  Supraventricular cardia History of SVT Prolonged Qtc -Patient has history of SVT at home patient is on propranolol  however due to poor oral intake, dehydration and  hypotension it has been on hold.  During last hospitalization. -Heart rate has been improved to 90 after fluid resuscitation. - Continue IV metoprolol  as needed.  Once blood pressure will be stabilized can resume propranolol  versus can start oral low-dose metoprolol .  Opioid-induced constipation -Continue MiraLAX  and Senokot.  DVT prophylaxis:  SQ Heparin  Code Status:  Full Code Diet: Heart healthy diet Family Communication: Patient's son available over phone during interview.  Updated patient's son over phone alongside with patient. Disposition Plan: Continue monitor improvement of renal function Consults: Nephrology Admission status:   Inpatient, Step Down Unit  Severity of Illness: The appropriate patient status for this patient is INPATIENT. Inpatient status is judged to be reasonable and necessary in order to provide the required intensity of service to ensure the patient's safety. The patient's presenting symptoms, physical exam findings, and initial radiographic and laboratory data in the context of their chronic comorbidities is felt to place them at high risk for further clinical deterioration. Furthermore, it is not anticipated that the patient will be medically stable for discharge from the hospital within 2 midnights of admission.   * I certify that at the point of admission it is my clinical judgment that the patient will require inpatient hospital care spanning beyond 2 midnights from the point of admission due to high intensity of service, high risk for further deterioration and high frequency of surveillance  required.DEWAINE    Izzac Rockett, MD [facility]  How to contact the [facility] Attending or Consulting provider 7A - 7P or covering provider during after hours 7P -7A, for this patient.  Check the care team in [facility] and look for a) attending/consulting [facility] provider listed and b) the [facility] team listed Log into [email or web address] and use [place]'s universal password to access. If you do not have the password, please contact the hospital operator. Locate the [facility] provider you are looking for under [facility] and page to a number that you can be directly reached. If you still have difficulty reaching the provider, please page the [facility] (Director on Call) for the Hospitalists listed on amion for assistance.  12/11/2023, 1:34 AM

## 2023-12-10 NOTE — ED Notes (Signed)
 Pt transferred to room 31 from CT. Pt unstable critical ists Ca 0.6, BP 87/60, and HR 140s. Pt arriving to room at this time.

## 2023-12-10 NOTE — ED Notes (Signed)
 Son Chelsea Hansen [phone] would like an update asap

## 2023-12-10 NOTE — Progress Notes (Signed)
 Secure chat w/ [facility]N r/t missing lactic results x 3 hrs, no response. Noted blood culture drawn 2022 x 2. Next secure chatted admitting MD for additional orders.

## 2023-12-10 NOTE — ED Triage Notes (Signed)
 Code Stroke  LNK 11 am - talked to son   Pt BIB [facility] from home d/t concerns for stroke. EMS reports aphasia worsening throughout transport and left sided facial droop. HR 150s, BP 172/108, RA 95%/. Has 18G LAC.

## 2023-12-10 NOTE — Consult Note (Signed)
 NEUROLOGY CONSULT NOTE   Date of service: December 10, 2023 Patient Name: Chelsea Hansen MRN:  [id number] DOB:  [birth year] Chief Complaint: I think I had a seizure Requesting Provider: Sundil, Subrina, MD  History of Present Illness  ASHBY LEFLORE is a 53 y.o. 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  She cannot clearly state whether or not she lost consciousness.  To EMS she reported she folic her symptoms were rapidly worsening in the past 2 hours prior to calling them hence last known well was reported at 5:30 PM but of note she is fairly confused unable to even state her age and therefore is not a reliable historian.  Per EMS team at 11 AM family had last spoken to her, and therefore this is used as last known well  There was some concern from EMS for aphasia and possible left facial droop however no facial droop appreciated on my evaluation.  Patient was having diffuse twitching and some confusion and muscle rigidity/spasms.  Calcium  resulted critically low at 0.61, additionally she was found to have an AKI with creatinine of 5.6 and BUN elevation to 50 from 20, with AST on ALT elevations as well as alk phos elevation.  Notably her symptoms of spasms and twitching were improving as calcium  was repleted  LKW: 11 AM when family last spoke to the patient Modified rankin score: 1 (ECOG 1 per oncology notes) IV Thrombolysis: No, out of the window EVT: No, exam not consistent with LVO   NIHSS components Score: Comment  1a Level of Conscious 0[x]  1[]  2[]  3[]      1b LOC Questions 0[]  1[x]  2[]     Stated do not know to age  1c LOC Commands 0[x]  1[]   2[]       2 Best Gaze 0[x]  1[]  2[]       3 Visual 0[x]  1[]  2[]  3[]      4 Facial Palsy 0[x]  1[]  2[]  3[]      5a Motor Arm - left 0[]  1[]  2[x]  3[]  4[]  UN[]    5b Motor Arm - Right 0[]  1[]  2[x]  3[]  4[]  UN[]    6a Motor Leg - Left 0[]  1[]  2[]  3[x]  4[]  UN[]    6b Motor Leg - Right 0[]  1[]  2[]  3[x]  4[]  UN[]    7 Limb Ataxia 0[]  1[]  2[]  UN[x]      8 Sensory 0[]  1[]  2[x]  UN[]    Reported could not feel anything in the bilateral lower extremities  9 Best Language 0[x]  1[]  2[]  3[]    Able to name and repeat but poor attention/concentration  10 Dysarthria 0[]  1[x]  2[]  UN[]      11 Extinct. and Inattention 0[x]  1[]  2[]       TOTAL: 14       ROS  Limited by patient's confusion  Past History   Past Medical History:  Diagnosis Date   Allergy    seasonal allergies   Anxiety    on meds   Asthma    uses inhaler   Breast cancer (HCC) 2012   RIGHT lumpectomy   Cancer (HCC) 2022   RIGHT breast lump-dx 2022   Depression    on meds   DVT (deep venous thrombosis) (HCC) 2010  after hysterectomy   Family history of uterine cancer    GERD (gastroesophageal reflux disease)    with certain foods/OTC PRN meds   Headache(784.0)    History of radiation therapy    Bilateral breast- 09/03/21-11/02/21- Dr. Lynwood Nasuti   Hyperlipidemia    on meds   Hypertension    on meds   SVT (supraventricular tachycardia) [facility])     Past Surgical History:  Procedure Laterality Date   ABDOMINAL HYSTERECTOMY     AXILLARY SENTINEL NODE BIOPSY Left 07/09/2021   Procedure: LEFT AXILLARY SENTINEL NODE BIOPSY;  Surgeon: [name], MD;  Location: [facility];  Service: General;  Laterality: Left;   BIOPSY  06/29/2022   Procedure: BIOPSY;  Surgeon: Legrand Victory LITTIE DOUGLAS, MD;  Location: [facility];  Service: Gastroenterology;;   BREAST EXCISIONAL BIOPSY Right 09/16/2009   BREAST LUMPECTOMY WITH RADIOACTIVE SEED LOCALIZATION Bilateral 07/09/2021   Procedure: BILATERAL BREAST LUMPECTOMY WITH RADIOACTIVE SEED  LOCALIZATION;  Surgeon: [name], MD;  Location: [facility];  Service: General;  Laterality: Bilateral;   BREAST SURGERY     lumpectomy   ESOPHAGOGASTRODUODENOSCOPY (EGD) WITH PROPOFOL  N/A 06/29/2022   Procedure: ESOPHAGOGASTRODUODENOSCOPY (EGD) WITH PROPOFOL ;  Surgeon: Legrand Victory LITTIE DOUGLAS, MD;  Location: [facility];  Service: Gastroenterology;  Laterality: N/A;   PORT-A-CATH REMOVAL Left 07/09/2021   Procedure: REMOVAL PORT-A-CATH;  Surgeon: [name], MD;  Location: [facility];  Service: General;  Laterality: Left;   PORTACATH PLACEMENT Left 01/26/2021   Procedure: INSERTION PORT-A-CATH;  Surgeon: [name], MD;  Location: [facility];  Service: General;  Laterality: Left;   RADIOACTIVE SEED GUIDED AXILLARY SENTINEL LYMPH NODE Right 07/09/2021   Procedure: RADIOACTIVE SEED GUIDED RIGHT AXILLARY SENTINEL LYMPH NODE DISSECTION;  Surgeon: [name], MD;  Location: [facility];  Service: General;  Laterality: Right;   TUBAL LIGATION      Family History: Family History  Problem Relation Age of Onset   Breast cancer Mother        89s   Hypertension Father    Uterine cancer Maternal Aunt    Ovarian cancer Maternal Aunt    Breast cancer Maternal Aunt        46s   Breast cancer Cousin 18   Uterine cancer Cousin 50   Colon polyps Neg Hx    Colon cancer Neg Hx    Esophageal cancer Neg Hx    Stomach cancer Neg Hx     Social History  reports that she has never smoked. She has never used smokeless tobacco. She reports that she does not currently use alcohol after a past usage of about 7.0 standard drinks of alcohol per week. She reports that she does not use drugs.  No Known Allergies  Medications   Current Facility-Administered Medications:    0.9 %  sodium chloride  infusion, 250 mL, Intravenous, PRN, Sundil, Subrina, MD   acetaminophen  (TYLENOL ) suppository 650 mg, 650 mg, Rectal, Once, Sundil, Subrina, MD    acetaminophen  (TYLENOL ) tablet 650 mg, 650 mg, Oral, Once PRN, Sundil, Subrina, MD   [START ON 12/11/2023] anastrozole  (ARIMIDEX ) tablet 1 mg, 1 mg, Oral, Daily, Sundil, Subrina, MD   calcium  gluconate 1 g/ 50 mL sodium chloride  IVPB, 1 g, Intravenous, STAT, Sundil, Subrina, MD   calcium -vitamin D (OSCAL WITH D) 500-5 MG-MCG per tablet 1 tablet, 1 tablet, Oral, TID, Sundil, Subrina, MD   heparin  injection 5,000 Units, 5,000 Units, Subcutaneous, Q8H, Sundil, Subrina, MD   lactated ringers   bolus 1,000 mL, 1,000 mL, Intravenous, Once, Sundil, Subrina, MD   lactated ringers  infusion, , Intravenous, Continuous, Sundil, Subrina, MD   metoprolol  tartrate (LOPRESSOR ) injection 2.5 mg, 2.5 mg, Intravenous, Q5 min PRN, Sundil, Subrina, MD   oxyCODONE -acetaminophen  (PERCOCET) 7.5-325 MG per tablet 1-2 tablet, 1-2 tablet, Oral, Q6H PRN, Sundil, Subrina, MD   senna-docusate (Senokot-S) tablet 1 tablet, 1 tablet, Oral, Daily PRN, Sundil, Subrina, MD   sodium chloride  flush (NS) 0.9 % injection 3 mL, 3 mL, Intravenous, Once, Sundil, Subrina, MD   sodium chloride  flush (NS) 0.9 % injection 3 mL, 3 mL, Intravenous, Q12H, Sundil, Subrina, MD   sodium chloride  flush (NS) 0.9 % injection 3 mL, 3 mL, Intravenous, Q12H, Sundil, Subrina, MD   sodium chloride  flush (NS) 0.9 % injection 3 mL, 3 mL, Intravenous, PRN, Sundil, Subrina, MD   trimethobenzamide PHYLLISTINE) injection 200 mg, 200 mg, Intramuscular, Q8H PRN, Sundil, Subrina, MD   vancomycin  (VANCOREADY) IVPB 1500 mg/300 mL, 1,500 mg, Intravenous, Once, Freddi Hamilton, MD, Last Rate: 150 mL/hr at 12/10/23 2244, 1,500 mg at 12/10/23 2244  Current Outpatient Medications:    [Paused] abemaciclib  (VERZENIO ) 50 MG tablet, Take 1 tablet (50 mg total) by mouth 2 (two) times daily., Disp: 56 tablet, Rfl: 3   albuterol  (VENTOLIN  HFA) 108 (90 Base) MCG/ACT inhaler, Inhale 2 puffs by mouth into the lungs every 6 hours as needed for wheezing or shortness of breath., Disp: 18 g,  Rfl: 2   anastrozole  (ARIMIDEX ) 1 MG tablet, Take 1 tablet (1 mg total) by mouth daily., Disp: 90 tablet, Rfl: 3   calcium -vitamin D (OSCAL WITH D) 500-5 MG-MCG tablet, Take 1 tablet by mouth daily with supper. (Patient not taking: Reported on 12/08/2023), Disp: 90 tablet, Rfl: 2   gabapentin  (NEURONTIN ) 300 MG capsule, Take 1 capsule (300 mg total) by mouth 3 (three) times daily. (Patient not taking: Reported on 12/08/2023), Disp: 90 capsule, Rfl: 6   montelukast  (SINGULAIR ) 10 MG tablet, Take 1 tablet (10 mg total) by mouth at bedtime. (Patient taking differently: Take 10 mg by mouth at bedtime as needed (for asthma flares or allergic symptoms).), Disp: 90 tablet, Rfl: 3   ondansetron  (ZOFRAN -ODT) 4 MG disintegrating tablet, Dissolve 1 tablet (4 mg total) by mouth every 8 (eight) hours as needed for nausea or vomiting. (Patient not taking: Reported on 12/08/2023), Disp: 20 tablet, Rfl: 0   oxyCODONE  (OXYCONTIN ) 10 mg 12 hr tablet, Take 1 tablet (10 mg total) by mouth every 12 (twelve) hours., Disp: 60 tablet, Rfl: 0   oxyCODONE -acetaminophen  (PERCOCET) 7.5-325 MG tablet, Take 1-2 tablets by mouth every 6 (six) hours as needed for severe pain (pain score 7-10)., Disp: 102 tablet, Rfl: 0   polyethylene glycol powder (GLYCOLAX /MIRALAX ) 17 GM/SCOOP powder, Mix 17 g in 4 oz of liquid and take by mouth 2 (two) times daily as needed., Disp: 3350 g, Rfl: 1   potassium chloride  SA (KLOR-CON  M) 20 MEQ tablet, Take 2 tablets (40 mEq total) by mouth daily for 3 days. (Patient not taking: Reported on 12/08/2023), Disp: 6 tablet, Rfl: 0   prochlorperazine  (COMPAZINE ) 10 MG tablet, Take 1 tablet (10 mg total) by mouth every 6 (six) hours as needed for nausea or vomiting., Disp: 30 tablet, Rfl: 1   [Paused] propranolol  (INDERAL ) 10 MG tablet, Take 1 tablet (10 mg total) by mouth 2 (two) times daily. Please call [phone] to schedule an appointment for future refills. Thank you. 3rd attempt., Disp: 30 tablet, Rfl: 0    scopolamine  (  TRANSDERM-SCOP) 1 MG/3DAYS, Place 1 patch (1.5 mg total) onto the skin every 3 (three) days as needed (Dizziness, nausea or vomiting)., Disp: 10 patch, Rfl: 12   senna (SENOKOT) 8.6 MG TABS tablet, Take 2 tablets (17.2 mg total) by mouth at bedtime. (Patient not taking: Reported on 12/08/2023), Disp: 60 tablet, Rfl: 3   sennosides-docusate sodium  (SENOKOT-S) 8.6-50 MG tablet, Take 1 tablet by mouth daily. (Patient not taking: Reported on 12/08/2023), Disp: 120 tablet, Rfl: 1  Vitals   Vitals:   12/10/23 2130 12/10/23 2134 12/10/23 2145 12/10/23 2200  BP: 102/72  107/72   Pulse:  99 91 90  Resp:  20 19 (!) 22  Temp:      TempSrc:      SpO2:  97% 97% 99%  Weight:        Body mass index is 25.03 kg/m.   Physical Exam   On general examination the patient is noted be having twitching and movements concerning for tetany  Neurologic Examination   Please see NIH stroke scale above  Labs/Imaging/Neurodiagnostic studies   CBC:  Recent Labs  Lab 12-20-2023 0555 12/10/23 2004 12/10/23 2005  WBC 5.1 10.3  --   NEUTROABS 3.3 6.7  --   HGB 11.3* 13.6 16.0*  HCT 35.5* 43.0 47.0*  MCV 75.1* 75.2*  --   PLT 209 324  --    Basic Metabolic Panel:  Lab Results  Component Value Date   NA 132 (L) 12/10/2023   K 3.3 (L) 12/10/2023   CO2 27 12/10/2023   GLUCOSE 231 (H) 12/10/2023   BUN 49 (H) 12/10/2023   CREATININE 5.30 (H) 12/10/2023   CALCIUM  6.7 (L) 12/10/2023   GFRNONAA 9 (L) 12/10/2023   GFRAA >60 09/07/2019   Lipid Panel:  Lab Results  Component Value Date   LDLCALC 140 (H) 10/06/2020   HgbA1c: No results found for: HGBA1C Urine Drug Screen:     Component Value Date/Time   LABOPIA NONE DETECTED 12/31/2011 0903   COCAINSCRNUR NONE DETECTED 12/31/2011 0903   LABBENZ NONE DETECTED 12/31/2011 0903   AMPHETMU NONE DETECTED 12/31/2011 0903   THCU NONE DETECTED 12/31/2011 0903   LABBARB NONE DETECTED 12/31/2011 0903    Alcohol Level     Component Value  Date/Time   [facility] <15 12/10/2023 2004   INR  Lab Results  Component Value Date   INR 1.1 12/10/2023   APTT  Lab Results  Component Value Date   APTT 28 12/10/2023    CT Head without contrast(Personally reviewed): No acute intracranial process   ASSESSMENT   ESTELLAR CADENA is a 53 y.o. female presenting with toxic/metabolic encephalopathy and tetany in the setting of severe hypocalcemia.  Again certainly it is possible she could have had a seizure in the setting of these electrolyte derangements but primary management would be treatment of the underlying electrolyte derangements and addressing underlying medical conditions of possible to prevent severe recurrent electrolyte derangements  RECOMMENDATIONS  -MRI brain only if patient does not return to baseline after treatment of her electrolyte derangements; and then stroke workup only if positive at that time, very low concern for stroke at this time due to lack of clear focal deficits on history and exam and clear alternative etiology for her symptoms -Please reach out to neurology for consideration of EEG if further concern for seizure activity despite electrolyte normalization ______________________________________________________________________   Lola Jernigan MD-PhD [facility] [phone]  CRITICAL CARE Performed by: Lola LITTIE Jernigan   Total critical care  time: 30 minutes  Critical care time was exclusive of separately billable procedures and treating other patients.  Critical care was necessary to treat or prevent imminent or life-threatening deterioration -- emergent evaluation for consideration of thrombectomy or thrombolytic administration  Critical care was time spent personally by me on the following activities: development of treatment plan with patient and/or surrogate as well as nursing, discussions with consultants, evaluation of patient's response to treatment, examination of patient, obtaining  history from patient or surrogate, ordering and performing treatments and interventions, ordering and review of laboratory studies, ordering and review of radiographic studies, pulse oximetry and re-evaluation of patient's condition.

## 2023-12-10 NOTE — ED Notes (Signed)
 Per neurology at bedside continue q2h

## 2023-12-10 NOTE — Progress Notes (Signed)
 Pharmacy Antibiotic Note  Chelsea Hansen is a 53 y.o. female admitted on 12/10/2023 for concerns of stroke and now with fever of unknown origin/sepsis.  Pharmacy has been consulted for vancomycin  dosing.  -sCr 5.8 (bl ~0.8-1)  Plan: -Cefepime  2g IV every 24 hours -Vancomycin  1500mg  IV x1 -Vancomycin  variable given AKI -Monitor renal function -Follow up signs of clinical improvement, LOT, de-escalation of antibiotics   Weight: 81.4 kg (179 lb 7.3 oz)  Temp (24hrs), Avg:99.8 F (37.7 C), Min:99.3 F (37.4 C), Max:100.2 F (37.9 C)  Recent Labs  Lab 12/05/23 0123 12/05/23 0531 12/05/23 1416 12/05/23 1705 12/06/23 0638 12/07/23 0555 12/10/23 2004 12/10/23 2005  WBC 7.0  --   --  5.5 4.5 5.1 10.3  --   CREATININE 3.37*  --    < > 1.55* 1.04* 0.80 5.60* 5.30*  LATICACIDVEN  --  1.0  --   --   --   --   --   --    < > = values in this interval not displayed.    Estimated Creatinine Clearance: 14 mL/min (A) (by C-G formula based on SCr of 5.3 mg/dL (H)).    No Known Allergies  Antimicrobials this admission: Cefepime  6/21 >>  Vancomycin  6/21 >>   Microbiology results: 6/21 BCx:   Thank you for allowing pharmacy to be a part of this patient's care.  Lynwood Poplar, PharmD, BCPS Clinical Pharmacist 12/11/2023 12:35 AM

## 2023-12-11 ENCOUNTER — Other Ambulatory Visit: Payer: Self-pay

## 2023-12-11 DIAGNOSIS — N189 Chronic kidney disease, unspecified: Secondary | ICD-10-CM | POA: Diagnosis not present

## 2023-12-11 DIAGNOSIS — C7981 Secondary malignant neoplasm of breast: Secondary | ICD-10-CM

## 2023-12-11 DIAGNOSIS — R112 Nausea with vomiting, unspecified: Secondary | ICD-10-CM | POA: Diagnosis not present

## 2023-12-11 DIAGNOSIS — T402X5A Adverse effect of other opioids, initial encounter: Secondary | ICD-10-CM

## 2023-12-11 DIAGNOSIS — K5903 Drug induced constipation: Secondary | ICD-10-CM

## 2023-12-11 DIAGNOSIS — N183 Chronic kidney disease, stage 3 unspecified: Secondary | ICD-10-CM

## 2023-12-11 DIAGNOSIS — N179 Acute kidney failure, unspecified: Secondary | ICD-10-CM | POA: Diagnosis not present

## 2023-12-11 DIAGNOSIS — E873 Alkalosis: Secondary | ICD-10-CM

## 2023-12-11 LAB — COMPREHENSIVE METABOLIC PANEL WITH GFR
ALT: 79 U/L — ABNORMAL HIGH (ref 0–44)
AST: 80 U/L — ABNORMAL HIGH (ref 15–41)
Albumin: 3.5 g/dL (ref 3.5–5.0)
Alkaline Phosphatase: 135 U/L — ABNORMAL HIGH (ref 38–126)
Anion gap: 14 (ref 5–15)
BUN: 44 mg/dL — ABNORMAL HIGH (ref 6–20)
CO2: 35 mmol/L — ABNORMAL HIGH (ref 22–32)
Calcium: 6.6 mg/dL — ABNORMAL LOW (ref 8.9–10.3)
Chloride: 86 mmol/L — ABNORMAL LOW (ref 98–111)
Creatinine, Ser: 2.93 mg/dL — ABNORMAL HIGH (ref 0.44–1.00)
GFR, Estimated: 19 mL/min — ABNORMAL LOW (ref >60)
Glucose, Bld: 100 mg/dL — ABNORMAL HIGH (ref 70–99)
Potassium: 3 mmol/L — ABNORMAL LOW (ref 3.5–5.1)
Sodium: 135 mmol/L (ref 135–145)
Total Bilirubin: 1 mg/dL (ref 0.0–1.2)
Total Protein: 6.9 g/dL (ref 6.5–8.1)

## 2023-12-11 LAB — URINALYSIS, W/ REFLEX TO CULTURE (INFECTION SUSPECTED)
Bilirubin Urine: NEGATIVE
Glucose, UA: 50 mg/dL — AB
Ketones, ur: NEGATIVE mg/dL
Leukocytes,Ua: NEGATIVE
Nitrite: NEGATIVE
Protein, ur: NEGATIVE mg/dL
Specific Gravity, Urine: 1.011 (ref 1.005–1.030)
pH: 9 — ABNORMAL HIGH (ref 5.0–8.0)

## 2023-12-11 LAB — CREATININE, URINE, RANDOM: Creatinine, Urine: 106 mg/dL

## 2023-12-11 LAB — BASIC METABOLIC PANEL WITH GFR
Anion gap: 15 (ref 5–15)
BUN: 49 mg/dL — ABNORMAL HIGH (ref 6–20)
CO2: 39 mmol/L — ABNORMAL HIGH (ref 22–32)
Calcium: 6.4 mg/dL — CL (ref 8.9–10.3)
Chloride: 81 mmol/L — ABNORMAL LOW (ref 98–111)
Creatinine, Ser: 3.84 mg/dL — ABNORMAL HIGH (ref 0.44–1.00)
GFR, Estimated: 14 mL/min — ABNORMAL LOW (ref >60)
Glucose, Bld: 106 mg/dL — ABNORMAL HIGH (ref 70–99)
Potassium: 3 mmol/L — ABNORMAL LOW (ref 3.5–5.1)
Sodium: 135 mmol/L (ref 135–145)

## 2023-12-11 LAB — CBC
HCT: 34.1 % — ABNORMAL LOW (ref 36.0–46.0)
Hemoglobin: 10.7 g/dL — ABNORMAL LOW (ref 12.0–15.0)
MCH: 23.6 pg — ABNORMAL LOW (ref 26.0–34.0)
MCHC: 31.4 g/dL (ref 30.0–36.0)
MCV: 75.1 fL — ABNORMAL LOW (ref 80.0–100.0)
Platelets: 223 10*3/uL (ref 150–400)
RBC: 4.54 MIL/uL (ref 3.87–5.11)
RDW: 14.2 % (ref 11.5–15.5)
WBC: 9.1 10*3/uL (ref 4.0–10.5)
nRBC: 0 % (ref 0.0–0.2)

## 2023-12-11 LAB — LACTIC ACID, PLASMA: Lactic Acid, Venous: 1.6 mmol/L (ref 0.5–1.9)

## 2023-12-11 LAB — SODIUM, URINE, RANDOM: Sodium, Ur: 105 mmol/L

## 2023-12-11 LAB — I-STAT CG4 LACTIC ACID, ED: Lactic Acid, Venous: 1.6 mmol/L (ref 0.5–1.9)

## 2023-12-11 LAB — MAGNESIUM
Magnesium: 2.4 mg/dL (ref 1.7–2.4)
Magnesium: 2.1 mg/dL (ref 1.7–2.4)

## 2023-12-11 LAB — VITAMIN D 25 HYDROXY (VIT D DEFICIENCY, FRACTURES): Vit D, 25-Hydroxy: 7.4 ng/mL — ABNORMAL LOW (ref 30–100)

## 2023-12-11 MED ORDER — VANCOMYCIN VARIABLE DOSE PER UNSTABLE RENAL FUNCTION (PHARMACIST DOSING): Status: DC

## 2023-12-11 MED ORDER — POTASSIUM CHLORIDE 20 MEQ PO PACK: 40.0000 meq | PACK | Freq: Two times a day (BID) | ORAL | Status: DC

## 2023-12-11 MED ORDER — POLYETHYLENE GLYCOL 3350 17 G PO PACK: 17.0000 g | PACK | Freq: Two times a day (BID) | ORAL | Status: DC

## 2023-12-11 MED ORDER — SODIUM CHLORIDE 0.9 % IV BOLUS
1000.0000 mL | Freq: Once | INTRAVENOUS | Status: AC
  Administered 2023-12-11: 1000 mL via INTRAVENOUS

## 2023-12-11 MED ORDER — METHYLNALTREXONE BROMIDE 12 MG/0.6ML ~~LOC~~ SOLN: 6.0000 mg | Freq: Once | SUBCUTANEOUS | Status: DC

## 2023-12-11 MED ORDER — SENNOSIDES-DOCUSATE SODIUM 8.6-50 MG PO TABS
1.0000 | ORAL_TABLET | Freq: Two times a day (BID) | ORAL | Status: DC
  Administered 2023-12-13 (×2): 1 via ORAL
  Filled 2023-12-11 (×4): qty 1

## 2023-12-11 MED ORDER — LORAZEPAM 0.5 MG PO TABS
0.5000 mg | ORAL_TABLET | ORAL | Status: DC | PRN
  Administered 2023-12-12: 0.5 mg via ORAL
  Filled 2023-12-11: qty 1

## 2023-12-11 MED ORDER — POLYETHYLENE GLYCOL 3350 17 G PO PACK
17.0000 g | PACK | Freq: Every day | ORAL | Status: DC
  Filled 2023-12-11 (×2): qty 1

## 2023-12-11 MED ORDER — MAGNESIUM SULFATE 2 GM/50ML IV SOLN: 2.0000 g | Freq: Once | INTRAVENOUS | Status: DC

## 2023-12-11 MED ORDER — SODIUM CHLORIDE 0.9 % IV SOLN: 2.0000 g | INTRAVENOUS | Status: DC

## 2023-12-11 MED ORDER — METHYLNALTREXONE BROMIDE 12 MG/0.6ML ~~LOC~~ SOLN
6.0000 mg | SUBCUTANEOUS | Status: DC
  Administered 2023-12-11 – 2023-12-13 (×2): 6 mg via SUBCUTANEOUS
  Filled 2023-12-11 (×2): qty 0.6

## 2023-12-11 MED ORDER — POTASSIUM CHLORIDE 20 MEQ PO PACK
40.0000 meq | PACK | Freq: Once | ORAL | Status: DC
  Filled 2023-12-11: qty 2

## 2023-12-11 MED ORDER — PANTOPRAZOLE SODIUM 40 MG IV SOLR
40.0000 mg | Freq: Two times a day (BID) | INTRAVENOUS | Status: DC
  Administered 2023-12-11 – 2023-12-14 (×7): 40 mg via INTRAVENOUS
  Filled 2023-12-11 (×8): qty 10

## 2023-12-11 MED ORDER — PROCHLORPERAZINE EDISYLATE 10 MG/2ML IJ SOLN
10.0000 mg | INTRAMUSCULAR | Status: DC | PRN
  Administered 2023-12-11 – 2023-12-14 (×7): 10 mg via INTRAVENOUS
  Filled 2023-12-11 (×7): qty 2

## 2023-12-11 MED ORDER — POTASSIUM CHLORIDE IN NACL 20-0.9 MEQ/L-% IV SOLN
INTRAVENOUS | Status: AC
  Filled 2023-12-11 (×4): qty 1000

## 2023-12-11 NOTE — Progress Notes (Signed)
 [facility] PROGRESS NOTE    Progress Note  Chelsea Hansen  FMW:[id number] DOB: 1970/11/09 DOA: 12/10/2023 PCP: [name], NP     Brief Narrative:   Chelsea Hansen is an 53 y.o. female past medical history of chronic kidney stage IIIa, recurrent hospital admissions for intractable nausea vomiting associated with electrolyte derangement and supraventricular tachycardia asthma essential hypertension chronic pulmonary nodule, metastatic breast cancer status post neoadjuvant chemotherapy and radiation comes into aphasia left-sided droop in the ED was found hypotensive and tachycardic and was found to be in acute kidney injury.  Patient is a very poor historian, she keeps jumping from symptom to symptom and admission to admission CTA of the head showed no CVA she was aggressively fluid resuscitated her blood pressure improved.   Assessment/Plan:   Intractable nausea and vomiting: On her last 2 admission there was a concern about an Abemaciclib  causing intractable nausea vomiting. I am concerned about significant irritation to her stomach, she denies any NSAIDs. Continue antinausea medication. Start on Protonix  IV twice daily, continue aggressive IV fluid hydration. Will go ahead and give her another liter of IV fluids. Consult GI for endoscopy. Her hemoglobin is down from 16-10 but she denies any melanotic stools is likely hemodilution.  Acute kidney injury superimposed on chronic kidney disease (HCC) CT scan of the abdomen pelvis showed diffuse metastatic disease Baseline creatinine like around 1, on admission creatinine 5.3, potassium 3.5. Likely hemodynamically mediated in the setting of intractable nausea and vomiting Started on IV fluids and her creatinine is improving slowly. Give her another liter of IV fluids will change to normal saline. On previous admission Abemaciclib  (has a high rate of adverse effects associated with nausea vomiting and ileus) was held as there  was concern this was contributing to her nausea and vomiting.  Hypocalcemia/dysarthria: Calcium  level on admission 6.7 receive IV calcium  in the ED. Vitamin D and PTH are pending. Os-cal tab dose increase. Check magnesium  level, will give magnesium  IV bolus.  Fever of unknown origin Has remained afebrile with a Tmax of 100.2. No leukocytosis. Blood cultures and urine cultures were sent. UA showed no evidence of infection. She was started on vancomycin  and cefepime  as she is immunosuppressed. Will go ahead and hold antibiotics continue to monitor fever curve.  History of breast cancer: Follow-up with oncology as an outpatient.  Chronic cancer-related pain. Currently on Percocet this could also be contributing to her nausea vomiting started on aggressive bowel regimen MiraLAX  twice a day.  Supraventricular tachycardia/prolonged QTc: Try to keep potassium greater than 4, magnesium  greater than 2. Heart rate is improved with IV metoprolol . Will due to hypotension is now resolved.  Opioid-induced constipation: Continue MiraLAX  and Senokot.    DVT prophylaxis: lovenox  Family Communication:Son Status is: Inpatient Remains inpatient appropriate because: Intractable nausea vomiting    Code Status:     Code Status Orders  (From admission, onward)           Start     Ordered   12/10/23 2305  Full code  Continuous       Question:  By:  Answer:  Consent: discussion documented in EHR   12/10/23 2305           Code Status History     Date Active Date Inactive Code Status Order ID Comments User Context   12/05/2023 0454 12/07/2023 1846 Full Code [id number]  Keturah Carrier, MD ED   11/24/2023 1635 11/26/2023 2136 Full Code [id number]  Lou Claretta CHRISTELLA, MD  ED   07/06/2023 1327 07/07/2023 0503 Full Code [id number]  Hughes Simmonds, MD HOV   06/28/2022 2326 06/30/2022 1958 Full Code [id number]  Lonzell Emeline HERO, DO ED   12/31/2011 0902 01/02/2012 1255 Full Code [id number]  Tarpley,  Briant Houston, RN Inpatient      Advance Directive Documentation    Flowsheet Row Most Recent Value  Type of Advance Directive Healthcare Power of Attorney  Pre-existing out of facility DNR order (yellow form or pink MOST form) --  MOST Form in Place? --      IV Access:   Peripheral IV   Procedures and diagnostic studies:   CT ABDOMEN PELVIS WO CONTRAST Result Date: 12/10/2023 CLINICAL DATA:  Abdominal pain, vomiting. EXAM: CT ABDOMEN AND PELVIS WITHOUT CONTRAST TECHNIQUE: Multidetector CT imaging of the abdomen and pelvis was performed following the standard protocol without IV contrast. RADIATION DOSE REDUCTION: This exam was performed according to the departmental dose-optimization program which includes automated exposure control, adjustment of the mA and/or kV according to patient size and/or use of iterative reconstruction technique. COMPARISON:  0/17/2025. FINDINGS: Lower chest: No acute abnormality. No pleural or pericardial effusion. Hepatobiliary: No focal liver abnormality is seen. No gallstones, gallbladder wall thickening, or biliary dilatation. Pancreas: Unremarkable. No pancreatic ductal dilatation or surrounding inflammatory changes. Spleen: Normal in size without focal abnormality. Adrenals/Urinary Tract: Adrenal glands are unremarkable. Kidneys are normal, without renal calculi, focal lesion, or hydronephrosis. Bladder is unremarkable. Stomach/Bowel: No bowel dilatation to suggest obstruction. Diffuse colonic diverticulosis. There is mucosal thickening in the hepatic flexure and possible apple-core lesion measuring 2.5 cm. This can be better evaluated endoscopically. Unremarkable appearance of the appendix. Vascular/Lymphatic: No significant vascular findings are present. No enlarged abdominal or pelvic lymph nodes. Reproductive: Status post hysterectomy. No adnexal masses. Other: No abdominal wall hernia or abnormality. No abdominopelvic ascites. Musculoskeletal: Diffuse  osteoblastic changes. Stable pathologic compression deformity L3. IMPRESSION: 1. Diffuse osteoblastic changes consistent with metastatic disease. 2. Possible colonic mucosal lesion in the hepatic flexure. This can be better evaluated endoscopically. 3. Diverticulosis. Electronically Signed   By: Fonda Field M.D.   On: 12/10/2023 22:22   DG Chest Port 1 View Result Date: 12/10/2023 CLINICAL DATA:  Questionable sepsis - evaluate for abnormality EXAM: PORTABLE CHEST - 1 VIEW COMPARISON:  12/05/2023. FINDINGS: Cardiac silhouette is unremarkable. No pneumothorax or pleural effusion. The lungs are clear. The visualized skeletal structures are unremarkable. IMPRESSION: No acute cardiopulmonary process. Electronically Signed   By: Fonda Field M.D.   On: 12/10/2023 21:55   CT HEAD CODE STROKE WO CONTRAST Result Date: 12/10/2023 CLINICAL DATA:  Code stroke.  Acute neurologic deficit EXAM: CT HEAD WITHOUT CONTRAST TECHNIQUE: Contiguous axial images were obtained from the base of the skull through the vertex without intravenous contrast. RADIATION DOSE REDUCTION: This exam was performed according to the departmental dose-optimization program which includes automated exposure control, adjustment of the mA and/or kV according to patient size and/or use of iterative reconstruction technique. COMPARISON:  12/05/2023 FINDINGS: Brain: There is no mass, hemorrhage or extra-axial collection. The size and configuration of the ventricles and extra-axial CSF spaces are normal. The brain parenchyma is normal, without evidence of acute or chronic infarction. Vascular: No abnormal hyperdensity of the major intracranial arteries or dural venous sinuses. No intracranial atherosclerosis. Skull: The visualized skull base, calvarium and extracranial soft tissues are normal. Sinuses/Orbits: No fluid levels or advanced mucosal thickening of the visualized paranasal sinuses. No mastoid or middle ear effusion. The orbits are normal.  ASPECTS (  [place] Stroke Program Early CT Score) - Ganglionic level infarction (caudate, lentiform nuclei, internal capsule, insula, M1-M3 cortex): 7 - Supraganglionic infarction (M4-M6 cortex): 3 Total score (0-10 with 10 being normal): 10 IMPRESSION: 1. No acute intracranial abnormality. 2. ASPECTS is 10. These results were communicated to Dr. Lola Jernigan at 8:11 pm on 12/10/2023 by text page via the [facility] messaging system. Electronically Signed   By: Franky Stanford M.D.   On: 12/10/2023 20:14     Medical Consultants:   None.   Subjective:    Chelsea Hansen relates she continues to have nausea and vomiting had a bowel movement this morning.  Objective:    Vitals:   12/11/23 0530 12/11/23 0545 12/11/23 0600 12/11/23 0615  BP:   107/77   Pulse: 73 70 69 69  Resp: 15 16 14 14   Temp:      TempSrc:      SpO2: 99% 99% 100% 99%  Weight:       SpO2: 99 %  No intake or output data in the 24 hours ending 12/11/23 0625 Filed Weights   12/10/23 2004  Weight: 81.4 kg    Exam: General exam: In no acute distress. Respiratory system: Good air movement and clear to auscultation. Cardiovascular system: S1 & S2 heard, RRR. No JVD.  Gastrointestinal system: Positive bowel sounds soft diffuse tenderness mainly in the epigastric area and suprapubic area. Extremities: No pedal edema. Skin: No rashes, lesions or ulcers Psychiatry: Judgement and insight appear normal. Mood & affect appropriate.    Data Reviewed:    Labs: Basic Metabolic Panel: Recent Labs  Lab 12/05/23 1416 12/05/23 1705 12/06/23 9361 12/07/23 0555 12/10/23 2004 12/10/23 2005 12/10/23 2048 12/11/23 0045  NA 135 134* 132* 132* 137 132*  --  135  K 2.9* 2.6* 3.3* 3.2* 3.5 3.3*  --  3.0*  CL 88* 86* 89* 92* 73* 76*  --  81*  CO2 31 35* 29 27 27   --   --  39*  GLUCOSE 88 109* 91 94 238* 231*  --  106*  BUN 41* 38* 28* 20 50* 49*  --  49*  CREATININE 1.74* 1.55* 1.04* 0.80 5.60* 5.30*  --  3.84*  CALCIUM  6.7*  6.9* 7.0* 8.1* 6.7*  --   --  6.4*  MG 2.5* 2.4 2.5* 2.5*  --   --  2.3  --   PHOS 4.0 3.2 2.5 3.2  --   --  4.8*  --    GFR Estimated Creatinine Clearance: 19.4 mL/min (A) (by C-G formula based on SCr of 3.84 mg/dL (H)). Liver Function Tests: Recent Labs  Lab 12/05/23 0123 12/05/23 1705 12/06/23 0638 12/07/23 0555 12/10/23 2004  AST 78* 57* 51* 40 98*  ALT 109* 79* 71* 59* 96*  ALKPHOS 207* 163* 160* 166* 164*  BILITOT 1.4* 0.8 0.8 1.0 1.0  PROT 8.8* 7.0 7.2 7.5 9.1*  ALBUMIN 4.7 3.6 3.7 3.7 4.4   Recent Labs  Lab 12/05/23 0819 12/06/23 0635 12/10/23 2048  LIPASE 37 35 45   No results for input(s): AMMONIA in the last 168 hours. Coagulation profile Recent Labs  Lab 12/10/23 2004  INR 1.1   COVID-19 Labs  No results for input(s): DDIMER, FERRITIN, LDH, CRP in the last 72 hours.  Lab Results  Component Value Date   SARSCOV2NAA NEGATIVE 12/10/2023   SARSCOV2NAA NEGATIVE 05/28/2020    CBC: Recent Labs  Lab 12/05/23 0123 12/05/23 1705 12/06/23 9361 12/07/23 0555 12/10/23 2004 12/10/23 2005  WBC 7.0 5.5 4.5 5.1 10.3  --   NEUTROABS 4.8 3.7 3.0 3.3 6.7  --   HGB 12.0 10.6* 10.3* 11.3* 13.6 16.0*  HCT 38.2 34.6* 33.8* 35.5* 43.0 47.0*  MCV 74.8* 77.4* 76.5* 75.1* 75.2*  --   PLT 350 253 220 209 324  --    Cardiac Enzymes: Recent Labs  Lab 12/10/23 2048  CKTOTAL 260*   BNP (last 3 results) No results for input(s): PROBNP in the last 8760 hours. CBG: Recent Labs  Lab 12/10/23 2000  GLUCAP 251*   D-Dimer: No results for input(s): DDIMER in the last 72 hours. Hgb A1c: No results for input(s): HGBA1C in the last 72 hours. Lipid Profile: No results for input(s): CHOL, HDL, LDLCALC, TRIG, CHOLHDL, LDLDIRECT in the last 72 hours. Thyroid  function studies: No results for input(s): TSH, T4TOTAL, T3FREE, THYROIDAB in the last 72 hours.  Invalid input(s): FREET3 Anemia work up: No results for input(s):  VITAMINB12, FOLATE, FERRITIN, TIBC, IRON, RETICCTPCT in the last 72 hours. Sepsis Labs: Recent Labs  Lab 12/05/23 0531 12/05/23 1705 12/06/23 9361 12/07/23 0555 12/10/23 2004 12/11/23 0045 12/11/23 0052  WBC  --  5.5 4.5 5.1 10.3  --   --   LATICACIDVEN 1.0  --   --   --   --  1.6 1.6   Microbiology Recent Results (from the past 240 hours)  Resp panel by RT-PCR (RSV, Flu A&B, Covid) Anterior Nasal Swab     Status: None   Collection Time: 12/10/23  8:48 PM   Specimen: Anterior Nasal Swab  Result Value Ref Range Status   SARS Coronavirus 2 by RT PCR NEGATIVE NEGATIVE Final   Influenza A by PCR NEGATIVE NEGATIVE Final   Influenza B by PCR NEGATIVE NEGATIVE Final    Comment: (NOTE) The Xpert Xpress SARS-CoV-2/FLU/RSV plus assay is intended as an aid in the diagnosis of influenza from Nasopharyngeal swab specimens and should not be used as a sole basis for treatment. Nasal washings and aspirates are unacceptable for Xpert Xpress SARS-CoV-2/FLU/RSV testing.  Fact Sheet for Patients: BloggerCourse.com  Fact Sheet for Healthcare Providers: SeriousBroker.it  This test is not yet approved or cleared by the [place]  FDA and has been authorized for detection and/or diagnosis of SARS-CoV-2 by FDA under an Emergency Use Authorization (EUA). This EUA will remain in effect (meaning this test can be used) for the duration of the COVID-19 declaration under Section 564(b)(1) of the Act, 21 U.S.C. section 360bbb-3(b)(1), unless the authorization is terminated or revoked.     Resp Syncytial Virus by PCR NEGATIVE NEGATIVE Final    Comment: (NOTE) Fact Sheet for Patients: BloggerCourse.com  Fact Sheet for Healthcare Providers: SeriousBroker.it  This test is not yet approved or cleared by the [place]  FDA and has been authorized for detection and/or diagnosis of  SARS-CoV-2 by FDA under an Emergency Use Authorization (EUA). This EUA will remain in effect (meaning this test can be used) for the duration of the COVID-19 declaration under Section 564(b)(1) of the Act, 21 U.S.C. section 360bbb-3(b)(1), unless the authorization is terminated or revoked.  Performed at [facility], [address] [place], [place] [address]      Medications:    anastrozole   1 mg Oral Daily   calcium -vitamin D  1 tablet Oral TID   heparin   5,000 Units Subcutaneous Q8H   polyethylene glycol  17 g Oral Daily   senna-docusate  1 tablet Oral BID   sodium chloride  flush  3  mL Intravenous Once   sodium chloride  flush  3 mL Intravenous Q12H   sodium chloride  flush  3 mL Intravenous Q12H   vancomycin  variable dose per unstable renal function (pharmacist dosing)   Does not apply See admin instructions   Continuous Infusions:  sodium chloride      ceFEPime  (MAXIPIME ) IV     lactated ringers  125 mL/hr at 12/11/23 0108      LOS: 1 day   Chelsea Hansen  [facility]  12/11/2023, 6:25 AM

## 2023-12-11 NOTE — Consult Note (Addendum)
 Attending physician's note   I have taken a history, reviewed the chart, and examined the patient. I performed a substantive portion of this encounter, including complete performance of at least one of the key components, in conjunction with the APP. I agree with the APP's note, impression, and recommendations with my edits.   53 year old female with medical history as outlined below, to include metastatic breast cancer with spine metastases, HTN, CKD 3, asthma, SVT, presenting with recurrent nausea/vomiting.  She was admitted twice earlier this month for the same issue on 6/5-7 and again 6/16-18.  During her last admission, her Abemarciclib was discontinued given increased ADR profile of nausea/vomiting.  She reports she is essentially had continued symptoms since discharge.  No change with Zofran  at home.  Medication options had been limited on prior admissions due to QTc prolongation.  Recent PET on 6/5 with distended stomach and caliber change in the duodenum with thickening but without abnormal uptake.  Does show numerous sclerotic bone metastases.  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  1) Nausea/vomiting 2) Abnormal imaging Given her intractable nausea/vomiting along with PET scan findings, plan for EGD on this admission to evaluate for PUD, gastritis, GOO, duodenal lesion, etc. - EGD tentatively planned for Tuesday, 6/24, to allow time for addressing multiple electrolyte abnormalities as below - IV Compazine   3) Hypocalcemia 4) Metabolic alkalosis 5) AKI on CKD 3-improving - Management per primary Hospital service - Repeat BMP check in the morning with electrolyte repletion.  Depending on degree of electrolyte derangement, may need to postpone EGD  6) Metastatic breast cancer - Abemaciclib  was discontinued on last admission  7) Constipation Suspect related to chronic pain medications - Start Relistor - Titrate MiraLAX  to effect  Dr. Federico  will assume her ongoing inpatient GI care starting tomorrow morning  [name] [place] [facility] [phone] office         Consultation  Referring Provider: [facility]/ Celinda Primary Care Physician:  [name], NP Primary Gastroenterologist:  unassigned  Reason for Consultation: Intractable nausea and vomiting  HPI: Chelsea Hansen is a 53 y.o. female, unfortunately with metastatic breast cancer with diffuse osseous disease who has had 2 very recent admissions 6/5 through 11/26/2023 and again 6/16 through 12/08/2023 both with issues with recurrent nausea and vomiting. She was brought back to the ER yesterday afternoon with concerns for acute CVA with brief episode of aphasia and concern for left-sided facial droop. She has history of chronic kidney disease stage III A, SVT, asthma, hypertension, hyperlipidemia. During her last admission she was seen by her oncologist and her abemaciclib  was held as potential culprit for nausea and vomiting.  In the emergency room yesterday with CT head showing no acute intracranial abnormality Chest x-ray negative CT abdomen and pelvis noncontrasted-no focal liver lesion no gallstones, diffuse colonic diverticulosis, there is mucosal thickening in the hepatic flexure noted measuring about 2.5 cm.  Patient relates that she was not feeling well when she left the hospital a few days ago and had still been having nausea and vomiting.  After she went home she continued to have episodes of nausea and vomiting and inability to keep down any p.o.'s.  She says as soon as she eats she will end up vomiting within 30 minutes to an hour.  She denies any dysphagia or odynophagia, no ongoing abdominal pain though she has been feeling some discomfort in her lower abdomen and says she has not had a good  bowel movement for 3 weeks, did pass a little bit of stool today.  She had also been having some urinary pressure/pressure with urination over the past few days. On arrival  to the ER she was noted to be twitching, which she said she had been doing over the past 24 hours.  Today on my exam she is not having twitching but did have 1 episode of severe pain in her right foot with drawing of the foot which she said she had been having at home.  Labs on 6/21-WBC 10.3/hemoglobin 13.6/hematocrit 43.0/platelets 324 Sodium 137/potassium 3.5/glucose 338/BUN 50/creatinine 5.6 Calcium  6.7-ionized calcium  pending T. bili 1.0/alk phos 164/AST 98/ALT 96 EtOH negative Blood culture done and pending Respiratory panel negative CK2 60  Today-potassium 3.0/calcium  6.4/BUN 49/creatinine 3.84 Lactate 1.6 UA-leukocyte -0-5 WBCs  Patient also had a recent PET scan done on 6 /5/25-numerous sclerotic bone metastases identified, there is 1 lymph node in the right inguinal region slightly larger and show some increased uptake this could be a metastatic node, tiny several lung nodules, noted distended stomach with fluid and debris with caliber change at the proximal duodenum as well as slight soft tissue thickening without abnormal uptake.  Past Medical History:  Diagnosis Date   Allergy    seasonal allergies   Anxiety    on meds   Asthma    uses inhaler   Breast cancer (HCC) 2012   RIGHT lumpectomy   Cancer (HCC) 2022   RIGHT breast lump-dx 2022   Depression    on meds   DVT (deep venous thrombosis) (HCC) 2010   after hysterectomy   Family history of uterine cancer    GERD (gastroesophageal reflux disease)    with certain foods/OTC PRN meds   Headache(784.0)    History of radiation therapy    Bilateral breast- 09/03/21-11/02/21- Dr. Lynwood Nasuti   Hyperlipidemia    on meds   Hypertension    on meds   SVT (supraventricular tachycardia) [facility])     Past Surgical History:  Procedure Laterality Date   ABDOMINAL HYSTERECTOMY     AXILLARY SENTINEL NODE BIOPSY Left 07/09/2021   Procedure: LEFT AXILLARY SENTINEL NODE BIOPSY;  Surgeon: [name], MD;  Location: [facility]  [place];  Service: General;  Laterality: Left;   BIOPSY  06/29/2022   Procedure: BIOPSY;  Surgeon: Legrand Victory LITTIE DOUGLAS, MD;  Location: [facility];  Service: Gastroenterology;;   BREAST EXCISIONAL BIOPSY Right 09/16/2009   BREAST LUMPECTOMY WITH RADIOACTIVE SEED LOCALIZATION Bilateral 07/09/2021   Procedure: BILATERAL BREAST LUMPECTOMY WITH RADIOACTIVE SEED LOCALIZATION;  Surgeon: [name], MD;  Location: [facility];  Service: General;  Laterality: Bilateral;   BREAST SURGERY     lumpectomy   ESOPHAGOGASTRODUODENOSCOPY (EGD) WITH PROPOFOL  N/A 06/29/2022   Procedure: ESOPHAGOGASTRODUODENOSCOPY (EGD) WITH PROPOFOL ;  Surgeon: Legrand Victory LITTIE DOUGLAS, MD;  Location: [facility];  Service: Gastroenterology;  Laterality: N/A;   PORT-A-CATH REMOVAL Left 07/09/2021   Procedure: REMOVAL PORT-A-CATH;  Surgeon: [name], MD;  Location: [facility];  Service: General;  Laterality: Left;   PORTACATH PLACEMENT Left 01/26/2021   Procedure: INSERTION PORT-A-CATH;  Surgeon: [name], MD;  Location: [facility];  Service: General;  Laterality: Left;   RADIOACTIVE SEED GUIDED AXILLARY SENTINEL LYMPH NODE Right 07/09/2021   Procedure: RADIOACTIVE SEED GUIDED RIGHT AXILLARY SENTINEL LYMPH NODE DISSECTION;  Surgeon: [name], MD;  Location: [facility];  Service: General;  Laterality: Right;   TUBAL LIGATION  Prior to Admission medications   Medication Sig Start Date End Date Taking? Authorizing Provider  prochlorperazine  (COMPAZINE ) 10 MG tablet Take 1 tablet (10 mg total) by mouth every 6 (six) hours as needed for nausea or vomiting. 12/07/23  Yes [name], Ripudeep K, MD  scopolamine  (TRANSDERM-SCOP) 1 MG/3DAYS Place 1 patch (1.5 mg total) onto the skin every 3 (three) days as needed (Dizziness, nausea or vomiting). 12/07/23  Yes [name], Ripudeep K, MD  abemaciclib  (VERZENIO ) 50 MG tablet Take 1 tablet (50 mg total) by mouth 2 (two) times daily.  12/01/23   [name], [name], MD  albuterol  (VENTOLIN  HFA) 108 (90 Base) MCG/ACT inhaler Inhale 2 puffs by mouth into the lungs every 6 hours as needed for wheezing or shortness of breath. 03/18/23   [name], NP  anastrozole  (ARIMIDEX ) 1 MG tablet Take 1 tablet (1 mg total) by mouth daily. 04/18/23   Causey, Morna Pickle, NP  calcium -vitamin D (OSCAL WITH D) 500-5 MG-MCG tablet Take 1 tablet by mouth daily with supper. Patient not taking: Reported on 12/08/2023 06/24/22   [name], [name], MD  gabapentin  (NEURONTIN ) 300 MG capsule Take 1 capsule (300 mg total) by mouth 3 (three) times daily. Patient not taking: Reported on 12/08/2023 05/26/23   [name], [name], MD  montelukast  (SINGULAIR ) 10 MG tablet Take 1 tablet (10 mg total) by mouth at bedtime. Patient taking differently: Take 10 mg by mouth at bedtime as needed (for asthma flares or allergic symptoms). 05/01/23   [name], NP  ondansetron  (ZOFRAN -ODT) 4 MG disintegrating tablet Dissolve 1 tablet (4 mg total) by mouth every 8 (eight) hours as needed for nausea or vomiting. Patient not taking: Reported on 12/08/2023 11/23/23   Glendia Rocky SAILOR, PA-C  oxyCODONE  (OXYCONTIN ) 10 mg 12 hr tablet Take 1 tablet (10 mg total) by mouth every 12 (twelve) hours. 11/29/23   [name], Athena N, NP  oxyCODONE -acetaminophen  (PERCOCET) 7.5-325 MG tablet Take 1-2 tablets by mouth every 6 (six) hours as needed for severe pain (pain score 7-10). 12/01/23   [name], [name], NP  polyethylene glycol powder (GLYCOLAX /MIRALAX ) 17 GM/SCOOP powder Mix 17 g in 4 oz of liquid and take by mouth 2 (two) times daily as needed. 11/30/23   [name], NP  potassium chloride  SA (KLOR-CON  M) 20 MEQ tablet Take 2 tablets (40 mEq total) by mouth daily for 3 days. Patient not taking: Reported on 12/08/2023 12/07/23 12/10/23  [name], [name], MD  propranolol  (INDERAL ) 10 MG tablet Take 1 tablet (10 mg total) by mouth 2 (two) times daily. Please call  [phone] to schedule an appointment for future refills. Thank you. 3rd attempt. 06/01/23   [name], [name], DO  senna (SENOKOT) 8.6 MG TABS tablet Take 2 tablets (17.2 mg total) by mouth at bedtime. Patient not taking: Reported on 12/08/2023 06/30/22   [name], [name], MD  sennosides-docusate sodium  (SENOKOT-S) 8.6-50 MG tablet Take 1 tablet by mouth daily. Patient not taking: Reported on 12/08/2023 11/30/23   [name], NP    Current Facility-Administered Medications  Medication Dose Route Frequency Provider Last Rate Last Admin   0.9 % NaCl with KCl 20 mEq/ L  infusion   Intravenous Continuous Odell Celinda Balo, MD 100 mL/hr at 12/11/23 0955 New Bag at 12/11/23 0955   acetaminophen  (TYLENOL ) tablet 650 mg  650 mg Oral Once PRN Sundil, Subrina, MD       anastrozole  (ARIMIDEX ) tablet 1 mg  1 mg Oral Daily Sundil, Subrina, MD  calcium -vitamin D (OSCAL WITH D) 500-5 MG-MCG per tablet 1 tablet  1 tablet Oral TID Sundil, Subrina, MD       metoprolol  tartrate (LOPRESSOR ) injection 2.5 mg  2.5 mg Intravenous Q5 min PRN Sundil, Subrina, MD       oxyCODONE -acetaminophen  (PERCOCET) 7.5-325 MG per tablet 1-2 tablet  1-2 tablet Oral Q6H PRN Sundil, Subrina, MD       pantoprazole  (PROTONIX ) injection 40 mg  40 mg Intravenous Q12H Odell Celinda Balo, MD   40 mg at 12/11/23 0824   polyethylene glycol (MIRALAX  / GLYCOLAX ) packet 17 g  17 g Oral Daily Sundil, Subrina, MD       polyethylene glycol (MIRALAX  / GLYCOLAX ) packet 17 g  17 g Oral BID Odell Celinda Balo, MD       potassium chloride  (KLOR-CON ) packet 40 mEq  40 mEq Oral Once Odell Celinda, Balo, MD       prochlorperazine  (COMPAZINE ) injection 10 mg  10 mg Intravenous Q4H PRN Odell Celinda Balo, MD   10 mg at 12/11/23 9178   senna-docusate (Senokot-S) tablet 1 tablet  1 tablet Oral BID Sundil, Subrina, MD       trimethobenzamide PHYLLISTINE) injection 200 mg  200 mg Intramuscular Q8H PRN Sundil, Subrina, MD   200 mg at 12/11/23 0137    Current Outpatient Medications  Medication Sig Dispense Refill   prochlorperazine  (COMPAZINE ) 10 MG tablet Take 1 tablet (10 mg total) by mouth every 6 (six) hours as needed for nausea or vomiting. 30 tablet 1   scopolamine  (TRANSDERM-SCOP) 1 MG/3DAYS Place 1 patch (1.5 mg total) onto the skin every 3 (three) days as needed (Dizziness, nausea or vomiting). 10 patch 12   [Paused] abemaciclib  (VERZENIO ) 50 MG tablet Take 1 tablet (50 mg total) by mouth 2 (two) times daily. 56 tablet 3   albuterol  (VENTOLIN  HFA) 108 (90 Base) MCG/ACT inhaler Inhale 2 puffs by mouth into the lungs every 6 hours as needed for wheezing or shortness of breath. 18 g 2   anastrozole  (ARIMIDEX ) 1 MG tablet Take 1 tablet (1 mg total) by mouth daily. 90 tablet 3   calcium -vitamin D (OSCAL WITH D) 500-5 MG-MCG tablet Take 1 tablet by mouth daily with supper. (Patient not taking: Reported on 12/08/2023) 90 tablet 2   gabapentin  (NEURONTIN ) 300 MG capsule Take 1 capsule (300 mg total) by mouth 3 (three) times daily. (Patient not taking: Reported on 12/08/2023) 90 capsule 6   montelukast  (SINGULAIR ) 10 MG tablet Take 1 tablet (10 mg total) by mouth at bedtime. (Patient taking differently: Take 10 mg by mouth at bedtime as needed (for asthma flares or allergic symptoms).) 90 tablet 3   ondansetron  (ZOFRAN -ODT) 4 MG disintegrating tablet Dissolve 1 tablet (4 mg total) by mouth every 8 (eight) hours as needed for nausea or vomiting. (Patient not taking: Reported on 12/08/2023) 20 tablet 0   oxyCODONE  (OXYCONTIN ) 10 mg 12 hr tablet Take 1 tablet (10 mg total) by mouth every 12 (twelve) hours. 60 tablet 0   oxyCODONE -acetaminophen  (PERCOCET) 7.5-325 MG tablet Take 1-2 tablets by mouth every 6 (six) hours as needed for severe pain (pain score 7-10). 102 tablet 0   polyethylene glycol powder (GLYCOLAX /MIRALAX ) 17 GM/SCOOP powder Mix 17 g in 4 oz of liquid and take by mouth 2 (two) times daily as needed. 3350 g 1   potassium chloride  SA  (KLOR-CON  M) 20 MEQ tablet Take 2 tablets (40 mEq total) by mouth daily for 3 days. (Patient not taking: Reported  on 12/08/2023) 6 tablet 0   [Paused] propranolol  (INDERAL ) 10 MG tablet Take 1 tablet (10 mg total) by mouth 2 (two) times daily. Please call [phone] to schedule an appointment for future refills. Thank you. 3rd attempt. 30 tablet 0   senna (SENOKOT) 8.6 MG TABS tablet Take 2 tablets (17.2 mg total) by mouth at bedtime. (Patient not taking: Reported on 12/08/2023) 60 tablet 3   sennosides-docusate sodium  (SENOKOT-S) 8.6-50 MG tablet Take 1 tablet by mouth daily. (Patient not taking: Reported on 12/08/2023) 120 tablet 1    Allergies as of 12/10/2023   (No Known Allergies)    Family History  Problem Relation Age of Onset   Breast cancer Mother        67s   Hypertension Father    Uterine cancer Maternal Aunt    Ovarian cancer Maternal Aunt    Breast cancer Maternal Aunt        19s   Breast cancer Cousin 67   Uterine cancer Cousin 24   Colon polyps Neg Hx    Colon cancer Neg Hx    Esophageal cancer Neg Hx    Stomach cancer Neg Hx     Social History   Socioeconomic History   Marital status: Single    Spouse name: Not on file   Number of children: Not on file   Years of education: Not on file   Highest education level: Not on file  Occupational History   Not on file  Tobacco Use   Smoking status: Never   Smokeless tobacco: Never  Vaping Use   Vaping status: Never Used  Substance and Sexual Activity   Alcohol use: Not Currently    Alcohol/week: 7.0 standard drinks of alcohol    Types: 7 Standard drinks or equivalent per week    Comment: occassional   Drug use: No   Sexual activity: Not Currently  Other Topics Concern   Not on file  Social History Narrative   Not on file   Social Drivers of Health   Financial Resource Strain: High Risk (01/14/2022)   Overall Financial Resource Strain (CARDIA)    Difficulty of Paying Living Expenses: Very hard  Food  Insecurity: No Food Insecurity (12/08/2023)   Hunger Vital Sign    Worried About Running Out of Food in the Last Year: Never true    Ran Out of Food in the Last Year: Never true  Recent Concern: Food Insecurity - Food Insecurity Present (11/24/2023)   Hunger Vital Sign    Worried About Running Out of Food in the Last Year: Sometimes true    Ran Out of Food in the Last Year: Sometimes true  Transportation Needs: No Transportation Needs (12/08/2023)   PRAPARE - Administrator, Civil Service (Medical): No    Lack of Transportation (Non-Medical): No  Physical Activity: Insufficiently Active (08/01/2023)   Exercise Vital Sign    Days of Exercise per Week: 1 day    Minutes of Exercise per Session: 10 min  Stress: Stress Concern Present (10/10/2023)   [employer] of Occupational Health - Occupational Stress Questionnaire    Feeling of Stress : Rather much  Social Connections: Not on file  Intimate Partner Violence: Not At Risk (12/08/2023)   Humiliation, Afraid, Rape, and Kick questionnaire    Fear of Current or Ex-Partner: No    Emotionally Abused: No    Physically Abused: No    Sexually Abused: No    Review of Systems: Pertinent positive and  negative review of systems were noted in the above HPI section.  All other review of systems was otherwise negative.   Physical Exam: Vital signs in last 24 hours: Temp:  [97.8 F (36.6 C)-100.2 F (37.9 C)] 98.2 F (36.8 C) (06/22 0842) Pulse Rate:  [60-142] 78 (06/22 0842) Resp:  [12-38] 16 (06/22 0842) BP: (86-134)/(52-104) 111/73 (06/22 0842) SpO2:  [92 %-100 %] 100 % (06/22 0842) Weight:  [81.4 kg] 81.4 kg (06/21 2004) Last BM Date : 12/11/23 General:   Alert,  Well-developed, chronically ill-appearing African-American female pleasant and cooperative in NAD uncomfortable with back pain and foot pain Head:  Normocephalic and atraumatic. Eyes:  Sclera clear, no icterus.   Conjunctiva pink. Ears:  Normal auditory  acuity. Nose:  No deformity, discharge,  or lesions. Mouth:  No deformity or lesions.   Neck:  Supple; no masses or thyromegaly. Lungs:  Clear throughout to auscultation.   No wheezes, crackles, or rhonchi.  Heart:  Regular rate and rhythm; no murmurs, clicks, rubs,  or gallops. Abdomen:  Soft, no palpable mass or hepatosplenomegaly, she does have some mild tenderness across the lower abdomen no guarding or rebound bowel sounds are present Rectal:  Deferred  Msk:  Symmetrical without gross deformities. . Pulses:  Normal pulses noted. Extremities:  Without clubbing or edema. Neurologic:  Alert and  oriented x4;  grossly normal neurologically. Skin:  Intact without significant lesions or rashes.. Psych:  Alert and cooperative. Normal mood and affect.  Intake/Output from previous day: No intake/output data recorded. Intake/Output this shift: No intake/output data recorded.  Lab Results: Recent Labs    12/10/23 2004 12/10/23 2005 12/11/23 0518  WBC 10.3  --  9.1  HGB 13.6 16.0* 10.7*  HCT 43.0 47.0* 34.1*  PLT 324  --  223   BMET Recent Labs    12/10/23 2004 12/10/23 2005 12/11/23 0045 12/11/23 0518  NA 137 132* 135 135  K 3.5 3.3* 3.0* 3.0*  CL 73* 76* 81* 86*  CO2 27  --  39* 35*  GLUCOSE 238* 231* 106* 100*  BUN 50* 49* 49* 44*  CREATININE 5.60* 5.30* 3.84* 2.93*  CALCIUM  6.7*  --  6.4* 6.6*   LFT Recent Labs    12/11/23 0518  PROT 6.9  ALBUMIN 3.5  AST 80*  ALT 79*  ALKPHOS 135*  BILITOT 1.0   PT/INR Recent Labs    12/10/23 2004  LABPROT 14.7  INR 1.1   Hepatitis Panel No results for input(s): HEPBSAG, HCVAB, HEPAIGM, HEPBIGM in the last 72 hours.   IMPRESSION:  #71 53 year old African-American female with metastatic breast cancer, diffuse osseous mets who presents back to the emergency room after she was just discharged 2 days ago with twitching at home, followed by a brief episode of aphasia, then left-sided facial droop She has ruled  out for acute CVA  #2 persistent intractable nausea and vomiting with inability to keep down p.o.'s-one of her chemotherapy Yutiq agents had been discontinued a few days back felt to be a possible culprit, no improvement thus far On Zofran  at home without improvement Patient with severe hypocalcemia which can precipitate nausea and vomiting  She also has questionable abnormality in the duodenum on PET scan with caliber change in the proximal duodenum, rule out metastatic disease, rule out inflammatory process peptic ulcer disease,  #3 acute kidney injury on chronic kidney disease stage IIIb  #4 severe hypocalcemia-symptomatic with muscle spasms #5 fever workup in process #6 hypertension  #7 severe obstipation/constipation  in setting of chronic narcotic use for metastatic breast cancer-   PLAN: Full liquid diet, n.p.o. after midnight Has been placed on IV Compazine  IV PPI She needs correction of her metabolic derangements i.e. hypocalcium /hypokalemia and acute kidney injury then will pursue EGD will likely be Tuesday.  Will continue Senokot, increase MiraLAX  to twice daily Start Relistor 12 mg daily over the next few days-reduce dose to 6 mg daily given chronic kidney disease  Amy EsterwoodPA-C  12/11/2023, 10:40 AM

## 2023-12-11 NOTE — Consult Note (Incomplete)
 [facility] Renal Consultation Note  Requesting MD:  Indication for Consultation:   HPI: *** Chelsea Hansen is a 52 y.o. female.   Creatinine  Date/Time Value Ref Range Status  12/01/2023 01:07 PM 1.07 (H) 0.44 - 1.00 mg/dL Final  96/88/7974 98:74 PM 0.76 0.44 - 1.00 mg/dL Final  87/94/7975 88:92 AM 0.78 0.44 - 1.00 mg/dL Final  90/94/7975 98:43 PM 0.76 0.44 - 1.00 mg/dL Final  91/87/7975 97:73 PM 1.15 (H) 0.44 - 1.00 mg/dL Final  92/83/7975 98:92 PM 0.79 0.44 - 1.00 mg/dL Final  93/81/7975 89:97 AM 0.73 0.44 - 1.00 mg/dL Final  93/95/7975 90:95 AM 0.83 0.44 - 1.00 mg/dL Final  96/87/7975 88:46 AM 0.93 0.44 - 1.00 mg/dL Final  98/95/7975 91:67 AM 1.01 (H) 0.44 - 1.00 mg/dL Final  87/80/7976 89:58 AM 1.06 (H) 0.44 - 1.00 mg/dL Final  87/93/7976 97:94 PM 1.08 (H) 0.44 - 1.00 mg/dL Final  87/95/7976 87:93 PM 1.16 (H) 0.44 - 1.00 mg/dL Final  88/71/7976 98:79 PM 0.84 0.44 - 1.00 mg/dL Final  90/98/7976 98:75 PM 0.74 0.44 - 1.00 mg/dL Final  92/72/7976 87:43 PM 0.84 0.44 - 1.00 mg/dL Final  88/70/7977 89:84 AM 0.76 0.44 - 1.00 mg/dL Final  90/87/7977 96:66 PM 0.70 0.44 - 1.00 mg/dL Final  93/70/7977 91:68 AM 0.91 0.44 - 1.00 mg/dL Final   Creatinine, Ser  Date/Time Value Ref Range Status  12/11/2023 05:18 AM 2.93 (H) 0.44 - 1.00 mg/dL Final  93/77/7974 87:54 AM 3.84 (H) 0.44 - 1.00 mg/dL Final  93/78/7974 91:94 PM 5.30 (H) 0.44 - 1.00 mg/dL Final  93/78/7974 91:95 PM 5.60 (H) 0.44 - 1.00 mg/dL Final  93/81/7974 94:44 AM 0.80 0.44 - 1.00 mg/dL Final  93/82/7974 93:61 AM 1.04 (H) 0.44 - 1.00 mg/dL Final  93/83/7974 94:94 PM 1.55 (H) 0.44 - 1.00 mg/dL Final  93/83/7974 97:83 PM 1.74 (H) 0.44 - 1.00 mg/dL Final  93/83/7974 98:76 AM 3.37 (H) 0.44 - 1.00 mg/dL Final  93/88/7974 89:99 AM 0.94 0.57 - 1.00 mg/dL Final  93/92/7974 96:59 AM 0.96 0.44 - 1.00 mg/dL Final  93/93/7974 96:80 AM 0.80 0.44 - 1.00 mg/dL Final  93/94/7974 91:41 AM 0.85 0.44 - 1.00 mg/dL Final   93/95/7974 87:58 PM 0.84 0.44 - 1.00 mg/dL Final  98/89/7975 88:50 AM 0.72 0.44 - 1.00 mg/dL Final  98/90/7975 94:66 AM 0.70 0.44 - 1.00 mg/dL Final  98/91/7975 91:89 PM 0.83 0.44 - 1.00 mg/dL Final  88/78/7976 95:98 PM 0.90 0.44 - 1.00 mg/dL Final  87/80/7977 91:82 AM 0.75 0.44 - 1.00 mg/dL Final  87/86/7977 90:83 AM 0.79 0.44 - 1.00 mg/dL Final  87/93/7977 91:99 AM 0.82 0.44 - 1.00 mg/dL Final  88/77/7977 91:96 AM 0.79 0.44 - 1.00 mg/dL Final  88/84/7977 91:88 AM 0.78 0.44 - 1.00 mg/dL Final  88/91/7977 91:53 AM 0.78 0.44 - 1.00 mg/dL Final  88/98/7977 92:56 AM 0.70 0.44 - 1.00 mg/dL Final  89/74/7977 90:61 AM 0.76 0.44 - 1.00 mg/dL Final  89/81/7977 92:53 AM 0.75 0.44 - 1.00 mg/dL Final  89/88/7977 90:84 AM 0.76 0.44 - 1.00 mg/dL Final  89/95/7977 92:41 AM 0.75 0.44 - 1.00 mg/dL Final  90/79/7977 91:63 AM 0.88 0.44 - 1.00 mg/dL Final  90/92/7977 92:66 AM 0.83 0.44 - 1.00 mg/dL Final  91/76/7977 98:78 PM 0.87 0.44 - 1.00 mg/dL Final  91/83/7977 97:80 PM 0.77 0.44 - 1.00 mg/dL Final  91/89/7977 91:45 AM 0.68 0.44 - 1.00 mg/dL Final  91/90/7977 87:85 PM 0.77 0.44 - 1.00 mg/dL Final  01/22/2021 11:15 AM 0.83 0.44 - 1.00 mg/dL Final  95/81/7977 97:67 PM 0.73 0.57 - 1.00 mg/dL Final  96/80/7978 98:59 PM 0.77 0.44 - 1.00 mg/dL Final  97/92/7980 98:48 PM 0.88 0.44 - 1.00 mg/dL Final  92/88/7981 98:65 PM 0.90 0.44 - 1.00 mg/dL Final  87/97/7984 89:86 AM 0.85 0.50 - 1.10 mg/dL Final  94/85/7984 88:88 AM 0.75 0.50 - 1.10 mg/dL Final  94/79/7985 87:92 PM 0.79 0.50 - 1.10 mg/dL Final  88/89/7986 98:97 AM 0.81 0.50 - 1.10 mg/dL Final  92/77/7986 92:72 AM 0.85 0.50 - 1.10 mg/dL Final  92/85/7986 95:49 AM 0.95 0.50 - 1.10 mg/dL Final  92/86/7986 93:81 AM 0.81 0.50 - 1.10 mg/dL Final  92/87/7986 90:89 AM 0.83 0.50 - 1.10 mg/dL Final  92/87/7986 97:45 AM 0.94 0.50 - 1.10 mg/dL Final  92/85/7987 93:44 AM 0.76 0.50 - 1.10 mg/dL Final    Comment:    **Please note change in reference range.**   08/11/2010 09:36 AM 0.88 0.4 - 1.2 mg/dL Final  89/87/7988 90:49 PM 0.80 0.40 - 1.20 mg/dL Final     PMHx:   Past Medical History:  Diagnosis Date   Allergy    seasonal allergies   Anxiety    on meds   Asthma    uses inhaler   Breast cancer (HCC) 2012   RIGHT lumpectomy   Cancer (HCC) 2022   RIGHT breast lump-dx 2022   Depression    on meds   DVT (deep venous thrombosis) (HCC) 2010   after hysterectomy   Family history of uterine cancer    GERD (gastroesophageal reflux disease)    with certain foods/OTC PRN meds   Headache(784.0)    History of radiation therapy    Bilateral breast- 09/03/21-11/02/21- Dr. Lynwood Nasuti   Hyperlipidemia    on meds   Hypertension    on meds   SVT (supraventricular tachycardia) (HCC)     Past Surgical History:  Procedure Laterality Date   ABDOMINAL HYSTERECTOMY     AXILLARY SENTINEL NODE BIOPSY Left 07/09/2021   Procedure: LEFT AXILLARY SENTINEL NODE BIOPSY;  Surgeon: [name], MD;  Location: [facility];  Service: General;  Laterality: Left;   BIOPSY  06/29/2022   Procedure: BIOPSY;  Surgeon: Legrand Victory LITTIE DOUGLAS, MD;  Location: [facility];  Service: Gastroenterology;;   BREAST EXCISIONAL BIOPSY Right 09/16/2009   BREAST LUMPECTOMY WITH RADIOACTIVE SEED LOCALIZATION Bilateral 07/09/2021   Procedure: BILATERAL BREAST LUMPECTOMY WITH RADIOACTIVE SEED LOCALIZATION;  Surgeon: [name], MD;  Location: [facility];  Service: General;  Laterality: Bilateral;   BREAST SURGERY     lumpectomy   ESOPHAGOGASTRODUODENOSCOPY (EGD) WITH PROPOFOL  N/A 06/29/2022   Procedure: ESOPHAGOGASTRODUODENOSCOPY (EGD) WITH PROPOFOL ;  Surgeon: Legrand Victory LITTIE DOUGLAS, MD;  Location: [facility];  Service: Gastroenterology;  Laterality: N/A;   PORT-A-CATH REMOVAL Left 07/09/2021   Procedure: REMOVAL PORT-A-CATH;  Surgeon: [name], MD;  Location:  SURGERY CENTER;  Service: General;  Laterality: Left;    PORTACATH PLACEMENT Left 01/26/2021   Procedure: INSERTION PORT-A-CATH;  Surgeon: [name], MD;  Location: [facility];  Service: General;  Laterality: Left;   RADIOACTIVE SEED GUIDED AXILLARY SENTINEL LYMPH NODE Right 07/09/2021   Procedure: RADIOACTIVE SEED GUIDED RIGHT AXILLARY SENTINEL LYMPH NODE DISSECTION;  Surgeon: [name], MD;  Location:  SURGERY CENTER;  Service: General;  Laterality: Right;   TUBAL LIGATION      Family Hx:  Family History  Problem Relation Age of Onset   Breast  cancer Mother        35s   Hypertension Father    Uterine cancer Maternal Aunt    Ovarian cancer Maternal Aunt    Breast cancer Maternal Aunt        6s   Breast cancer Cousin 68   Uterine cancer Cousin 20   Colon polyps Neg Hx    Colon cancer Neg Hx    Esophageal cancer Neg Hx    Stomach cancer Neg Hx     Social History:  reports that she has never smoked. She has never used smokeless tobacco. She reports that she does not currently use alcohol after a past usage of about 7.0 standard drinks of alcohol per week. She reports that she does not use drugs.  Allergies: No Known Allergies  Medications: Prior to Admission medications   Medication Sig Start Date End Date Taking? Authorizing Provider  prochlorperazine  (COMPAZINE ) 10 MG tablet Take 1 tablet (10 mg total) by mouth every 6 (six) hours as needed for nausea or vomiting. 12/07/23  Yes [name], Ripudeep K, MD  scopolamine  (TRANSDERM-SCOP) 1 MG/3DAYS Place 1 patch (1.5 mg total) onto the skin every 3 (three) days as needed (Dizziness, nausea or vomiting). 12/07/23  Yes [name], Ripudeep K, MD  abemaciclib  (VERZENIO ) 50 MG tablet Take 1 tablet (50 mg total) by mouth 2 (two) times daily. 12/01/23   [name], [name], MD  albuterol  (VENTOLIN  HFA) 108 (90 Base) MCG/ACT inhaler Inhale 2 puffs by mouth into the lungs every 6 hours as needed for wheezing or shortness of breath. 03/18/23   [name], NP  anastrozole  (ARIMIDEX ) 1 MG tablet  Take 1 tablet (1 mg total) by mouth daily. 04/18/23   Causey, Morna Pickle, NP  calcium -vitamin D (OSCAL WITH D) 500-5 MG-MCG tablet Take 1 tablet by mouth daily with supper. Patient not taking: Reported on 12/08/2023 06/24/22   [name], [name], MD  gabapentin  (NEURONTIN ) 300 MG capsule Take 1 capsule (300 mg total) by mouth 3 (three) times daily. Patient not taking: Reported on 12/08/2023 05/26/23   [name], [name], MD  montelukast  (SINGULAIR ) 10 MG tablet Take 1 tablet (10 mg total) by mouth at bedtime. Patient taking differently: Take 10 mg by mouth at bedtime as needed (for asthma flares or allergic symptoms). 05/01/23   [name], NP  ondansetron  (ZOFRAN -ODT) 4 MG disintegrating tablet Dissolve 1 tablet (4 mg total) by mouth every 8 (eight) hours as needed for nausea or vomiting. Patient not taking: Reported on 12/08/2023 11/23/23   Glendia Rocky SAILOR, PA-C  oxyCODONE  (OXYCONTIN ) 10 mg 12 hr tablet Take 1 tablet (10 mg total) by mouth every 12 (twelve) hours. 11/29/23   [name], Athena N, NP  oxyCODONE -acetaminophen  (PERCOCET) 7.5-325 MG tablet Take 1-2 tablets by mouth every 6 (six) hours as needed for severe pain (pain score 7-10). 12/01/23   [name], [name], NP  polyethylene glycol powder (GLYCOLAX /MIRALAX ) 17 GM/SCOOP powder Mix 17 g in 4 oz of liquid and take by mouth 2 (two) times daily as needed. 11/30/23   [name], NP  potassium chloride  SA (KLOR-CON  M) 20 MEQ tablet Take 2 tablets (40 mEq total) by mouth daily for 3 days. Patient not taking: Reported on 12/08/2023 12/07/23 12/10/23  [name], [name], MD  propranolol  (INDERAL ) 10 MG tablet Take 1 tablet (10 mg total) by mouth 2 (two) times daily. Please call [phone] to schedule an appointment for future refills. Thank you. 3rd attempt. 06/01/23   [name], [name], DO  senna (SENOKOT)  8.6 MG TABS tablet Take 2 tablets (17.2 mg total) by mouth at bedtime. Patient not taking: Reported on 12/08/2023 06/30/22   [name],  [name], MD  sennosides-docusate sodium  (SENOKOT-S) 8.6-50 MG tablet Take 1 tablet by mouth daily. Patient not taking: Reported on 12/08/2023 11/30/23   [name], NP    {medication reviewed/display:3041432}  Labs:  Results for orders placed or performed during the hospital encounter of 12/10/23 (from the past 48 hours)  CBG monitoring, ED     Status: Abnormal   Collection Time: 12/10/23  8:00 PM  Result Value Ref Range   Glucose-Capillary 251 (H) 70 - 99 mg/dL    Comment: Glucose reference range applies only to samples taken after fasting for at least 8 hours.  Protime-INR     Status: None   Collection Time: 12/10/23  8:04 PM  Result Value Ref Range   Prothrombin Time 14.7 11.4 - 15.2 seconds   INR 1.1 0.8 - 1.2    Comment: (NOTE) INR goal varies based on device and disease states. Performed at [facility], [address], [place], [place] [address]   APTT     Status: None   Collection Time: 12/10/23  8:04 PM  Result Value Ref Range   aPTT 28 24 - 36 seconds    Comment: Performed at [facility], [address] [place], [place] [address]  CBC     Status: Abnormal   Collection Time: 12/10/23  8:04 PM  Result Value Ref Range   WBC 10.3 4.0 - 10.5 K/uL   RBC 5.72 (H) 3.87 - 5.11 MIL/uL   Hemoglobin 13.6 12.0 - 15.0 g/dL   HCT 56.9 63.9 - 53.9 %   MCV 75.2 (L) 80.0 - 100.0 fL   MCH 23.8 (L) 26.0 - 34.0 pg   MCHC 31.6 30.0 - 36.0 g/dL   RDW 85.7 88.4 - 84.4 %   Platelets 324 150 - 400 K/uL   nRBC 0.0 0.0 - 0.2 %    Comment: Performed at [facility], [address], [place], [place] [address]  Differential     Status: Abnormal   Collection Time: 12/10/23  8:04 PM  Result Value Ref Range   Neutrophils Relative % 65 %   Neutro Abs 6.7 1.7 - 7.7 K/uL   Lymphocytes Relative 27 %   Lymphs Abs 2.7 0.7 - 4.0 K/uL   Monocytes Relative 7 %   Monocytes Absolute 0.7 0.1 - 1.0 K/uL   Eosinophils Relative 0 %   Eosinophils Absolute 0.0 0.0 - 0.5 K/uL    Basophils Relative 0 %   Basophils Absolute 0.0 0.0 - 0.1 K/uL   Immature Granulocytes 1 %   Abs Immature Granulocytes 0.12 (H) 0.00 - 0.07 K/uL    Comment: Performed at [facility], [address], [place], [place] [address]  Comprehensive metabolic panel     Status: Abnormal   Collection Time: 12/10/23  8:04 PM  Result Value Ref Range   Sodium 137 135 - 145 mmol/L   Potassium 3.5 3.5 - 5.1 mmol/L   Chloride 73 (L) 98 - 111 mmol/L   CO2 27 22 - 32 mmol/L   Glucose, Bld 238 (H) 70 - 99 mg/dL    Comment: Glucose reference range applies only to samples taken after fasting for at least 8 hours.   BUN 50 (H) 6 - 20 mg/dL   Creatinine, Ser 4.39 (H) 0.44 - 1.00 mg/dL  Calcium  6.7 (L) 8.9 - 10.3 mg/dL   Total Protein 9.1 (H) 6.5 - 8.1 g/dL   Albumin 4.4 3.5 - 5.0 g/dL   AST 98 (H) 15 - 41 U/L   ALT 96 (H) 0 - 44 U/L    Comment: RESULT CONFIRMED BY MANUAL DILUTION   Alkaline Phosphatase 164 (H) 38 - 126 U/L   Total Bilirubin 1.0 0.0 - 1.2 mg/dL   GFR, Estimated 9 (L) >60 mL/min    Comment: (NOTE) Calculated using the CKD-EPI Creatinine Equation (2021)    Anion gap 37 (H) 5 - 15    Comment: ELECTROLYTES REPEATED TO VERIFY Performed at [facility], [address], [place], [place] [address]   Ethanol     Status: None   Collection Time: 12/10/23  8:04 PM  Result Value Ref Range   Alcohol, Ethyl (B) <15 <15 mg/dL    Comment: (NOTE) For medical purposes only. Performed at [facility], [address] [place], [place] [address]   I-stat chem 8, ED     Status: Abnormal   Collection Time: 12/10/23  8:05 PM  Result Value Ref Range   Sodium 132 (L) 135 - 145 mmol/L   Potassium 3.3 (L) 3.5 - 5.1 mmol/L   Chloride 76 (L) 98 - 111 mmol/L   BUN 49 (H) 6 - 20 mg/dL   Creatinine, Ser 4.69 (H) 0.44 - 1.00 mg/dL   Glucose, Bld 768 (H) 70 - 99 mg/dL    Comment: Glucose reference range applies only to samples taken after fasting for at least 8 hours.   Calcium , Ion  0.61 (LL) 1.15 - 1.40 mmol/L   TCO2 30 22 - 32 mmol/L   Hemoglobin 16.0 (H) 12.0 - 15.0 g/dL   HCT 52.9 (H) 63.9 - 53.9 %   Comment NOTIFIED PHYSICIAN   Blood Culture (routine x 2)     Status: None (Preliminary result)   Collection Time: 12/10/23  8:22 PM   Specimen: BLOOD RIGHT HAND  Result Value Ref Range   Specimen Description BLOOD RIGHT HAND    Special Requests      BOTTLES DRAWN AEROBIC AND ANAEROBIC Blood Culture adequate volume   Culture      NO GROWTH < 12 HOURS Performed at [facility], [address], [place], [place] [address]    Report Status PENDING   Resp panel by RT-PCR (RSV, Flu A&B, Covid) Anterior Nasal Swab     Status: None   Collection Time: 12/10/23  8:48 PM   Specimen: Anterior Nasal Swab  Result Value Ref Range   SARS Coronavirus 2 by RT PCR NEGATIVE NEGATIVE   Influenza A by PCR NEGATIVE NEGATIVE   Influenza B by PCR NEGATIVE NEGATIVE    Comment: (NOTE) The Xpert Xpress SARS-CoV-2/FLU/RSV plus assay is intended as an aid in the diagnosis of influenza from Nasopharyngeal swab specimens and should not be used as a sole basis for treatment. Nasal washings and aspirates are unacceptable for Xpert Xpress SARS-CoV-2/FLU/RSV testing.  Fact Sheet for Patients: BloggerCourse.com  Fact Sheet for Healthcare Providers: SeriousBroker.it  This test is not yet approved or cleared by the [place]  FDA and has been authorized for detection and/or diagnosis of SARS-CoV-2 by FDA under an Emergency Use Authorization (EUA). This EUA will remain in effect (meaning this test can be used) for the duration of the COVID-19 declaration under Section 564(b)(1) of the Act, 21 U.S.C. section 360bbb-3(b)(1), unless the authorization is terminated or revoked.  Resp Syncytial Virus by PCR NEGATIVE NEGATIVE    Comment: (NOTE) Fact Sheet for Patients: BloggerCourse.com  Fact Sheet for  Healthcare Providers: SeriousBroker.it  This test is not yet approved or cleared by the [place]  FDA and has been authorized for detection and/or diagnosis of SARS-CoV-2 by FDA under an Emergency Use Authorization (EUA). This EUA will remain in effect (meaning this test can be used) for the duration of the COVID-19 declaration under Section 564(b)(1) of the Act, 21 U.S.C. section 360bbb-3(b)(1), unless the authorization is terminated or revoked.  Performed at [facility], [address] [place], [place] [address]   Blood Culture (routine x 2)     Status: None (Preliminary result)   Collection Time: 12/10/23  8:48 PM   Specimen: BLOOD  Result Value Ref Range   Specimen Description BLOOD RIGHT ANTECUBITAL    Special Requests      BOTTLES DRAWN AEROBIC AND ANAEROBIC Blood Culture adequate volume   Culture      NO GROWTH < 12 HOURS Performed at [facility], [address], [place], [place] [address]    Report Status PENDING   Magnesium      Status: None   Collection Time: 12/10/23  8:48 PM  Result Value Ref Range   Magnesium  2.3 1.7 - 2.4 mg/dL    Comment: Performed at [facility], [address], [place], [place] [address]  Phosphorus     Status: Abnormal   Collection Time: 12/10/23  8:48 PM  Result Value Ref Range   Phosphorus 4.8 (H) 2.5 - 4.6 mg/dL    Comment: Performed at [facility], [address] [place], [place] [address]  Lipase, blood     Status: None   Collection Time: 12/10/23  8:48 PM  Result Value Ref Range   Lipase 45 11 - 51 U/L    Comment: Performed at [name], [address] [place], [place] [address]  CK     Status: Abnormal   Collection Time: 12/10/23  8:48 PM  Result Value Ref Range   Total CK 260 (H) 38 - 234 U/L    Comment: Performed at [facility], [address] [place], [place] [address]  Basic metabolic panel     Status: Abnormal   Collection Time: 12/11/23 12:45 AM   Result Value Ref Range   Sodium 135 135 - 145 mmol/L   Potassium 3.0 (L) 3.5 - 5.1 mmol/L    Comment: HEMOLYSIS AT THIS LEVEL MAY AFFECT RESULT   Chloride 81 (L) 98 - 111 mmol/L   CO2 39 (H) 22 - 32 mmol/L   Glucose, Bld 106 (H) 70 - 99 mg/dL    Comment: Glucose reference range applies only to samples taken after fasting for at least 8 hours.   BUN 49 (H) 6 - 20 mg/dL   Creatinine, Ser 6.15 (H) 0.44 - 1.00 mg/dL   Calcium  6.4 (LL) 8.9 - 10.3 mg/dL    Comment: FIRST CALL ATTEMPT AT 0125 CRITICAL RESULT CALLED TO, READ BACK BY AND VERIFIED WITH B. OSORIO, RN AT 0135 06.22.25 JLASIGAN    GFR, Estimated 14 (L) >60 mL/min    Comment: (NOTE) Calculated using the CKD-EPI Creatinine Equation (2021)    Anion gap 15 5 - 15    Comment: Performed at [facility], [address], [place], [place] [address]  Lactic acid, plasma     Status: None   Collection Time: 12/11/23  12:45 AM  Result Value Ref Range   Lactic Acid, Venous 1.6 0.5 - 1.9 mmol/L    Comment: Performed at [facility], [address] [place], [place] [address]  I-Stat Lactic Acid, ED     Status: None   Collection Time: 12/11/23 12:52 AM  Result Value Ref Range   Lactic Acid, Venous 1.6 0.5 - 1.9 mmol/L  Urinalysis, w/ Reflex to Culture (Infection Suspected) -Urine, Catheterized     Status: Abnormal   Collection Time: 12/11/23  4:54 AM  Result Value Ref Range   Specimen Source URINE, CATHETERIZED    Color, Urine YELLOW YELLOW   APPearance HAZY (A) CLEAR   Specific Gravity, Urine 1.011 1.005 - 1.030   pH 9.0 (H) 5.0 - 8.0   Glucose, UA 50 (A) NEGATIVE mg/dL   Hgb urine dipstick SMALL (A) NEGATIVE   Bilirubin Urine NEGATIVE NEGATIVE   Ketones, ur NEGATIVE NEGATIVE mg/dL   Protein, ur NEGATIVE NEGATIVE mg/dL   Nitrite NEGATIVE NEGATIVE   Leukocytes,Ua NEGATIVE NEGATIVE   RBC / HPF 0-5 0 - 5 RBC/hpf   WBC, UA 0-5 0 - 5 WBC/hpf    Comment:        Reflex urine culture not performed if WBC <=10, OR if Squamous  epithelial cells >5. If Squamous epithelial cells >5 suggest recollection.    Bacteria, UA RARE (A) NONE SEEN   Squamous Epithelial / HPF 0-5 0 - 5 /HPF   Mucus PRESENT    Hyaline Casts, UA PRESENT     Comment: Performed at [facility], [address] [place], [place] [address]  Creatinine, urine, random     Status: None   Collection Time: 12/11/23  4:54 AM  Result Value Ref Range   Creatinine, Urine 106 mg/dL    Comment: Performed at [facility], [address], [place], [place] [address]  Sodium, urine, random     Status: None   Collection Time: 12/11/23  4:54 AM  Result Value Ref Range   Sodium, Ur 105 mmol/L    Comment: Performed at [facility], [address] [place], [place] [address]  CBC     Status: Abnormal   Collection Time: 12/11/23  5:18 AM  Result Value Ref Range   WBC 9.1 4.0 - 10.5 K/uL   RBC 4.54 3.87 - 5.11 MIL/uL   Hemoglobin 10.7 (L) 12.0 - 15.0 g/dL   HCT 65.8 (L) 63.9 - 53.9 %   MCV 75.1 (L) 80.0 - 100.0 fL   MCH 23.6 (L) 26.0 - 34.0 pg   MCHC 31.4 30.0 - 36.0 g/dL   RDW 85.7 88.4 - 84.4 %   Platelets 223 150 - 400 K/uL   nRBC 0.0 0.0 - 0.2 %    Comment: Performed at [facility], [address] [place], [place] [address]  Comprehensive metabolic panel     Status: Abnormal   Collection Time: 12/11/23  5:18 AM  Result Value Ref Range   Sodium 135 135 - 145 mmol/L   Potassium 3.0 (L) 3.5 - 5.1 mmol/L   Chloride 86 (L) 98 - 111 mmol/L   CO2 35 (H) 22 - 32 mmol/L   Glucose, Bld 100 (H) 70 - 99 mg/dL    Comment: Glucose reference range applies only to samples taken after fasting for at least 8 hours.   BUN 44 (H) 6 - 20 mg/dL   Creatinine, Ser 7.06 (H) 0.44 - 1.00 mg/dL  Calcium  6.6 (L) 8.9 - 10.3 mg/dL   Total Protein 6.9 6.5 - 8.1 g/dL   Albumin 3.5 3.5 - 5.0 g/dL   AST 80 (H) 15 - 41 U/L   ALT 79 (H) 0 - 44 U/L   Alkaline Phosphatase 135 (H) 38 - 126 U/L   Total Bilirubin 1.0 0.0 - 1.2 mg/dL   GFR, Estimated 19 (L)  >60 mL/min    Comment: (NOTE) Calculated using the CKD-EPI Creatinine Equation (2021)    Anion gap 14 5 - 15    Comment: Performed at [facility], [address] [place], [place] [address]  Magnesium      Status: None   Collection Time: 12/11/23  5:18 AM  Result Value Ref Range   Magnesium  2.4 1.7 - 2.4 mg/dL    Comment: Performed at [facility], [address], [place], [place] [address]  Magnesium      Status: None   Collection Time: 12/11/23  8:18 AM  Result Value Ref Range   Magnesium  2.1 1.7 - 2.4 mg/dL    Comment: Performed at [facility], [address], [place], [place] [address]   *Note: Due to a large number of results and/or encounters for the requested time period, some results have not been displayed. A complete set of results can be found in Results Review.     ROS:  {ros-complete:30496}  Physical Exam: Vitals:   12/11/23 1300 12/11/23 1454  BP: 102/65 105/66  Pulse: (!) 59 65  Resp: 15 17  Temp:  (!) 97.5 F (36.4 C)  SpO2: 100% 100%     General: HEENT: Eyes: Neck: Heart: Lungs: Abdomen: Extremities: Skin: Neuro:  Assessment/Plan: 1.Renal- *** 2. Hypertension/volume  - *** 4. Anemia  - ***   Yosselyn Tax A Naveed Humphres 12/11/2023, 3:06 PM

## 2023-12-11 NOTE — Plan of Care (Signed)

## 2023-12-12 ENCOUNTER — Inpatient Hospital Stay (HOSPITAL_COMMUNITY): Payer: MEDICAID

## 2023-12-12 DIAGNOSIS — R933 Abnormal findings on diagnostic imaging of other parts of digestive tract: Secondary | ICD-10-CM | POA: Diagnosis not present

## 2023-12-12 DIAGNOSIS — D649 Anemia, unspecified: Secondary | ICD-10-CM | POA: Diagnosis not present

## 2023-12-12 DIAGNOSIS — C50919 Malignant neoplasm of unspecified site of unspecified female breast: Secondary | ICD-10-CM

## 2023-12-12 DIAGNOSIS — N179 Acute kidney failure, unspecified: Secondary | ICD-10-CM | POA: Diagnosis not present

## 2023-12-12 DIAGNOSIS — K5900 Constipation, unspecified: Secondary | ICD-10-CM

## 2023-12-12 DIAGNOSIS — N189 Chronic kidney disease, unspecified: Secondary | ICD-10-CM

## 2023-12-12 DIAGNOSIS — R112 Nausea with vomiting, unspecified: Secondary | ICD-10-CM | POA: Diagnosis not present

## 2023-12-12 LAB — CALCIUM, IONIZED: Calcium, Ionized, Serum: 3.8 mg/dL — ABNORMAL LOW (ref 4.5–5.6)

## 2023-12-12 MED ORDER — SODIUM CHLORIDE 0.9 % IV SOLN: INTRAVENOUS | Status: DC

## 2023-12-12 MED ORDER — CALCITRIOL 0.25 MCG PO CAPS
0.5000 ug | ORAL_CAPSULE | Freq: Every day | ORAL | Status: DC
  Administered 2023-12-12 – 2023-12-14 (×3): 0.5 ug via ORAL
  Filled 2023-12-12 (×3): qty 2

## 2023-12-12 MED ORDER — POTASSIUM CHLORIDE 2 MEQ/ML IV SOLN
INTRAVENOUS | Status: DC
  Filled 2023-12-12 (×2): qty 1000

## 2023-12-12 MED ORDER — POTASSIUM CHLORIDE CRYS ER 20 MEQ PO TBCR
40.0000 meq | EXTENDED_RELEASE_TABLET | Freq: Two times a day (BID) | ORAL | Status: AC
  Administered 2023-12-12 (×2): 40 meq via ORAL
  Filled 2023-12-12 (×2): qty 2

## 2023-12-12 MED ORDER — VITAMIN D 25 MCG (1000 UNIT) PO TABS
1000.0000 [IU] | ORAL_TABLET | Freq: Every day | ORAL | Status: DC
  Administered 2023-12-12 – 2023-12-13 (×2): 1000 [IU] via ORAL
  Filled 2023-12-12 (×2): qty 1

## 2023-12-12 MED ORDER — SODIUM CHLORIDE 0.9 % IV SOLN
4.0000 g | Freq: Once | INTRAVENOUS | Status: AC
  Administered 2023-12-12: 4 g via INTRAVENOUS
  Filled 2023-12-12: qty 40

## 2023-12-12 NOTE — H&P (View-Only) (Signed)
 Daily Progress Note  DOA: 12/10/2023 Hospital Day: 3   Cc:   anemia    ASSESSMENT    53 yo female with metastatic breast cancer admitted with persistent nausea / vomiting with associated electrolyte abnormalities. Recent PET scan showing distended stomach abnormal duodenum (without abnormal uptake).   Possible colonic lesion on noncontrast CT scan .  Possible apple core lesion measuring 2.5 cm in the hepatic flexure.  PET scan on 12/01/2023 without activity in that area.  She had a screening colonoscopy May 2022.  Bowel prep was fair.  A sessile 7 mm nonadenomatous polyp was removed in the transverse colon   Severe hypocalcemia (POA) She had associated facial asymmetry due to tetany.  Ionized calcium  was 0.61 .  Treated with calcium  gluconate.  Neurologically back to baseline  Constipation Receiving Relistor and MiraLAX .  She has declined Senokot   Principal Problem:   Acute kidney injury superimposed on chronic kidney disease (HCC) Active Problems:   Breast cancer (HCC)   Chronic pain syndrome (breast cancer)   Supraventricular tachycardia (HCC)   HLD (hyperlipidemia)   Essential hypertension   Intractable nausea and vomiting   QT prolongation   Hypocalcemia   Fever of unknown origin   Sinus tachycardia   Therapeutic opioid-induced constipation (OIC)   PLAN   --Proceed with small bowel enteroscopy tomorrow.  The risks and benefits of small bowel enteroscopy with possible biopsies were discussed with the patient who agrees to proceed.  -- Significance of the hepatic flexure lesion seen on noncontrast CT scan is unclear especially given that no activity was demonstrated on PET scan earlier this month.  Depending on clinical course we could pursue colonoscopy at a later date.  I do not think she can tolerate the bowel prep now due to nausea/vomiting  Subjective   Antiemetics are helping with the nausea and vomiting.    Objective    Recent Labs     12/10/23 2004 12/10/23 2005 12/11/23 0518  WBC 10.3  --  9.1  HGB 13.6 16.0* 10.7*  HCT 43.0 47.0* 34.1*  MCV 75.2*  --  75.1*  PLT 324  --  223   No results for input(s): FOLATE, VITAMINB12, FERRITIN, TIBC, IRONPCTSAT in the last 72 hours. Recent Labs    12/10/23 2004 12/10/23 2005 12/11/23 0045 12/11/23 0518  NA 137 132* 135 135  K 3.5 3.3* 3.0* 3.0*  CL 73* 76* 81* 86*  CO2 27  --  39* 35*  GLUCOSE 238* 231* 106* 100*  BUN 50* 49* 49* 44*  CREATININE 5.60* 5.30* 3.84* 2.93*  CALCIUM  6.7*  --  6.4* 6.6*   Recent Labs    12/10/23 2004 12/11/23 0518  PROT 9.1* 6.9  ALBUMIN 4.4 3.5  AST 98* 80*  ALT 96* 79*  ALKPHOS 164* 135*  BILITOT 1.0 1.0      Imaging:  DG Abd Portable 1V EXAM: 1 VIEW XRAY OF THE ABDOMEN SUPINE 12/12/2023 06:03:00 AM  COMPARISON: 1 view abdomen 01/04/2024. CT of the abdomen and pelvis 12/10/2023 and 12/06/2023.  CLINICAL HISTORY: Nausea and vomiting.  FINDINGS:  BOWEL: Nonobstructive bowel gas pattern.  PERITONEUM AND SOFT TISSUES: No abnormal calcifications.  BONES: No acute osseous abnormality.  IMPRESSION: 1. No acute findings.  Electronically signed by: Lonni Necessary MD 12/12/2023 06:21 AM EDT RP Workstation: [id number]     Scheduled inpatient medications:   anastrozole   1 mg Oral Daily   calcitRIOL  0.5 mcg Oral Daily   cholecalciferol   1,000 Units Oral Daily   methylnaltrexone  6 mg Subcutaneous Q48H   pantoprazole  (PROTONIX ) IV  40 mg Intravenous Q12H   polyethylene glycol  17 g Oral Daily   potassium chloride   40 mEq Oral BID   senna-docusate  1 tablet Oral BID   Continuous inpatient infusions:   sodium chloride      lactated ringers  1,000 mL with potassium chloride  20 mEq infusion 75 mL/hr at 12/12/23 1104   PRN inpatient medications: acetaminophen , LORazepam , metoprolol  tartrate, oxyCODONE -acetaminophen , prochlorperazine , trimethobenzamide  Vital signs in last 24 hours: Temp:  [98.3  F (36.8 C)-99.1 F (37.3 C)] 98.3 F (36.8 C) (06/23 1319) Pulse Rate:  [73] 73 (06/23 0810) Resp:  [18] 18 (06/23 1319) BP: (93-111)/(62-73) 105/73 (06/23 1319) SpO2:  [96 %] 96 % (06/23 0810) Last BM Date : 12/11/23 No intake or output data in the 24 hours ending 12/12/23 1654  Intake/Output from previous day: 06/22 0701 - 06/23 0700 In: 1000 [IV Piggyback:1000] Out: -  Intake/Output this shift: No intake/output data recorded.   Physical Exam:  General: Alert female in NAD Heart:  Regular rate and rhythm.  Pulmonary: Normal respiratory effort Abdomen: Soft, mildly distended, nontender. Normal bowel sounds. Extremities: No lower extremity edema  Neurologic: Alert and oriented Psych: Pleasant. Cooperative     LOS: 2 days   Vina Dasen ,NP 12/12/2023, 4:54 PM

## 2023-12-12 NOTE — Evaluation (Signed)
 Physical Therapy Evaluation Patient Details Name: Chelsea Hansen MRN: [id number] DOB: 17-Nov-1970 Today's Date: 12/12/2023  History of Present Illness  Pt is a 53 year old woman admitted on 6/21 with intractable vomiting and stroke like symptoms related to hypocalcemia, + AKI. Pt recently discharged from [facility] with similar symptoms. 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  Clinical Impression  Pt presents with admitting diagnosis above. Pt today was able to ambulate short distance in hallway with RW CGA. Distance limited due to dizziness. PTA pt was fully independent. Pt states that she will be discharging to her daughter's house and appears to be close to her baseline. Pt likely will need no follow up PT however PT will continue to follow acutely.        If plan is discharge home, recommend the following: Help with stairs or ramp for entrance;Supervision due to cognitive status;A little help with bathing/dressing/bathroom;Assistance with cooking/housework;Direct supervision/assist for medications management;Assist for transportation   Can travel by private vehicle        Equipment Recommendations Rolling walker (2 wheels)  Recommendations for Other Services       Functional Status Assessment Patient has had a recent decline in their functional status and demonstrates the ability to make significant improvements in function in a reasonable and predictable amount of time.     Precautions / Restrictions Precautions Precautions: Fall Recall of Precautions/Restrictions: Intact Restrictions Weight Bearing Restrictions Per Provider Order: No      Mobility  Bed Mobility Overal bed mobility: Modified Independent                  Transfers Overall transfer level: Needs assistance Equipment used: Rolling walker (2 wheels) Transfers: Sit to/from Stand Sit to Stand: Contact guard assist           General transfer  comment: cues for hand placement    Ambulation/Gait Ambulation/Gait assistance: Contact guard assist, +2 safety/equipment (Lines and leads) Gait Distance (Feet): 15 Feet Assistive device: Rolling walker (2 wheels) Gait Pattern/deviations: Decreased stride length, Step-through pattern Gait velocity: decreased     General Gait Details: Distance limited by dizziness. no LOB noted. +2 for lines and leads.  Stairs            Wheelchair Mobility     Tilt Bed    Modified Rankin (Stroke Patients Only)       Balance Overall balance assessment: Needs assistance   Sitting balance-Leahy Scale: Good       Standing balance-Leahy Scale: Poor                               Pertinent Vitals/Pain Pain Assessment Pain Assessment: No/denies pain    Home Living Family/patient expects to be discharged to:: Private residence Living Arrangements: Children Available Help at Discharge: Family;Available 24 hours/day Type of Home: House Home Access: Stairs to enter Entrance Stairs-Rails: Can reach both Entrance Stairs-Number of Steps: 6-8 Alternate Level Stairs-Number of Steps: 12 Home Layout: Two level;Able to live on main level with bedroom/bathroom;1/2 bath on main level Home Equipment: None Additional Comments: plans to discharge to her daughter's home    Prior Function Prior Level of Function : Independent/Modified Independent;Working/employed;Driving             Mobility Comments: Ind ADLs Comments: Production designer, theatre/television/film at [employer]     Extremity/Trunk Assessment   Upper Extremity Assessment Upper Extremity Assessment: Overall  WFL for tasks assessed    Lower Extremity Assessment Lower Extremity Assessment: Overall WFL for tasks assessed    Cervical / Trunk Assessment Cervical / Trunk Assessment: Normal;Other exceptions Cervical / Trunk Exceptions: mets in spine  Communication   Communication Communication: No apparent difficulties    Cognition Arousal:  Alert Behavior During Therapy: Flat affect                             Following commands: Intact       Cueing Cueing Techniques: Verbal cues     General Comments General comments (skin integrity, edema, etc.): BP: 112/75 seated, BP: 106/66 standing    Exercises     Assessment/Plan    PT Assessment Patient needs continued PT services  PT Problem List Decreased strength;Decreased range of motion;Decreased activity tolerance;Decreased balance;Decreased mobility;Decreased coordination;Decreased knowledge of use of DME;Decreased safety awareness;Decreased knowledge of precautions;Cardiopulmonary status limiting activity       PT Treatment Interventions DME instruction;Gait training;Stair training;Functional mobility training;Therapeutic activities;Therapeutic exercise;Balance training;Neuromuscular re-education;Patient/family education    PT Goals (Current goals can be found in the Care Plan section)  Acute Rehab PT Goals Patient Stated Goal: to get better PT Goal Formulation: With patient Time For Goal Achievement: 12/26/23 Potential to Achieve Goals: Good    Frequency Min 3X/week     Co-evaluation               AM-PAC PT 6 Clicks Mobility  Outcome Measure Help needed turning from your back to your side while in a flat bed without using bedrails?: None Help needed moving from lying on your back to sitting on the side of a flat bed without using bedrails?: None Help needed moving to and from a bed to a chair (including a wheelchair)?: A Little Help needed standing up from a chair using your arms (e.g., wheelchair or bedside chair)?: A Little Help needed to walk in hospital room?: A Little Help needed climbing 3-5 steps with a railing? : A Lot 6 Click Score: 19    End of Session Equipment Utilized During Treatment: Gait belt Activity Tolerance: Patient tolerated treatment well Patient left: in chair;with call bell/phone within reach;with  family/visitor present Nurse Communication: Mobility status PT Visit Diagnosis: Other abnormalities of gait and mobility (R26.89)    Time: 8986-8960 PT Time Calculation (min) (ACUTE ONLY): 26 min   Charges:   PT Evaluation $PT Eval Moderate Complexity: 1 Mod   PT General Charges $$ ACUTE PT VISIT: 1 Visit         Chelsea Hansen, PT, DPT Acute Rehab Services [phone]   Chelsea Hansen 12/12/2023, 11:56 AM

## 2023-12-12 NOTE — Progress Notes (Signed)
 Chelsea Hansen   DOB:1970/08/07   FM#:[id number]      ASSESSMENT & PLAN:  Chelsea Hansen is a 53 year old patient with oncologic history significant for left breast cancer.  Admitted on 12/10/2023 with complaints with concerns for stroke.  Of note, patient was recently discharged on 6/18 after complaints of generalized body aches, nausea, and constipation.  Follows with Medical Oncology/Dr. [name].   Breast cancer, left breast, ER + PR + HER2- -Initially diagnosed as a stage Ia 12/17/2020.  Restaged as stage IIa in 2023. - Status post neoadjuvant chemotherapy with doxorubicin  and cyclophosphamide , followed by weekly Taxol .   -- Status post right lumpectomy 07/09/2021.  Followed by adjuvant radiation therapy. - Given antiestrogen oral therapy with letrozole , then changed to anastrozole .   -- Has been on Verzenio , please HOLD at this time due to intractable nausea/vomiting. -Last PET scan 11/24/2023 shows sclerotic bone mets increasing appearance.  Nodal disease showed significant improvement.  Tiny lung nodules right upper lobe. -- CT chest ordered and pending. Will follow results. - Follows with medical oncology/Dr. [name].    Code Stroke -- patient admitted as a code stroke from home after concerns for aphasia and left sided facial droop. -- No aphasia or facial droop noted on exam today   Nausea/vomiting, ongoing Constipation  - Reports that nausea/vomiting has not improved  - Continue antiemetics and electrolyte repletion as ordered -- Appreciate GI eval, planning EGD 6/24 - Continue supportive care   AKI  Hypocalcemia  -Admitted with elevated creatinine 5.6, decreasing to 2.93 today - Continue hydration and electrolyte replacement  - Avoid nephrotoxic agents -- Calcium  6.4  - Continue to monitor CMP   Anemia - Hemoglobin 10.7 today - Baseline 10-13 range - No transfusional intervention required at this time - Continue to monitor CBC with differential    Code  Status Full  Subjective:  Patient seen awake and alert laying in bed. Speaks well although slowly. No facial droop noted. Complains of ongoing nausea. Patient aware Verzenio  is on hold.  Asking about discharge, informed by nurse that she is scheduled for EGD tomorrow, patient agreeable to procedure.   Objective:  No intake or output data in the 24 hours ending 12/12/23 1415   PHYSICAL EXAMINATION: ECOG PERFORMANCE STATUS: 3 - Symptomatic, >50% confined to bed  Vitals:   12/12/23 0810 12/12/23 1319  BP: 99/62 105/73  Pulse: 73   Resp: 18   Temp: 98.9 F (37.2 C) 98.3 F (36.8 C)  SpO2: 96%    Filed Weights   12/10/23 2004  Weight: 179 lb 7.3 oz (81.4 kg)    GENERAL: alert, no distress and comfortable +ill-appearing SKIN: skin color, texture, turgor are normal, no rashes or significant lesions EYES: normal, conjunctiva are pink and non-injected, sclera clear OROPHARYNX: no exudate, no erythema and lips, buccal mucosa, and tongue normal  NECK: supple, thyroid  normal size, non-tender, without nodularity LYMPH: no palpable lymphadenopathy in the cervical, axillary or inguinal LUNGS: clear to auscultation and percussion with normal breathing effort HEART: regular rate & rhythm and no murmurs and no lower extremity edema ABDOMEN: abdomen soft, non-tender and normal bowel sounds MUSCULOSKELETAL: no cyanosis of digits and no clubbing  PSYCH: alert & oriented x 3 with fluent speech NEURO: no focal motor/sensory deficits   All questions were answered. The patient knows to call the clinic with any problems, questions or concerns.   The total time spent in the appointment was 40 minutes encounter with patient including review of chart and various  tests results, discussions about plan of care and coordination of care plan  Chelsea Chelsea Brunner, NP 12/12/2023 2:15 PM    Labs Reviewed:  Lab Results  Component Value Date   WBC 9.1 12/11/2023   HGB 10.7 (L) 12/11/2023   HCT 34.1 (L)  12/11/2023   MCV 75.1 (L) 12/11/2023   PLT 223 12/11/2023   Recent Labs    12/07/23 0555 12/10/23 2004 12/10/23 2005 12/11/23 0045 12/11/23 0518  NA 132* 137 132* 135 135  K 3.2* 3.5 3.3* 3.0* 3.0*  CL 92* 73* 76* 81* 86*  CO2 27 27  --  39* 35*  GLUCOSE 94 238* 231* 106* 100*  BUN 20 50* 49* 49* 44*  CREATININE 0.80 5.60* 5.30* 3.84* 2.93*  CALCIUM  8.1* 6.7*  --  6.4* 6.6*  GFRNONAA >60 9*  --  14* 19*  PROT 7.5 9.1*  --   --  6.9  ALBUMIN 3.7 4.4  --   --  3.5  AST 40 98*  --   --  80*  ALT 59* 96*  --   --  79*  ALKPHOS 166* 164*  --   --  135*  BILITOT 1.0 1.0  --   --  1.0    Studies Reviewed:  DG Abd Portable 1V Result Date: 12/12/2023 EXAM: 1 VIEW XRAY OF THE ABDOMEN SUPINE 12/12/2023 06:03:00 AM COMPARISON: 1 view abdomen 01/04/2024. CT of the abdomen and pelvis 12/10/2023 and 12/06/2023. CLINICAL HISTORY: Nausea and vomiting. FINDINGS: BOWEL: Nonobstructive bowel gas pattern. PERITONEUM AND SOFT TISSUES: No abnormal calcifications. BONES: No acute osseous abnormality. IMPRESSION: 1. No acute findings. Electronically signed by: Lonni Necessary MD 12/12/2023 06:21 AM EDT RP Workstation: [id number]   CT ABDOMEN PELVIS WO CONTRAST Result Date: 12/10/2023 CLINICAL DATA:  Abdominal pain, vomiting. EXAM: CT ABDOMEN AND PELVIS WITHOUT CONTRAST TECHNIQUE: Multidetector CT imaging of the abdomen and pelvis was performed following the standard protocol without IV contrast. RADIATION DOSE REDUCTION: This exam was performed according to the departmental dose-optimization program which includes automated exposure control, adjustment of the mA and/or kV according to patient size and/or use of iterative reconstruction technique. COMPARISON:  0/17/2025. FINDINGS: Lower chest: No acute abnormality. No pleural or pericardial effusion. Hepatobiliary: No focal liver abnormality is seen. No gallstones, gallbladder wall thickening, or biliary dilatation. Pancreas: Unremarkable. No pancreatic  ductal dilatation or surrounding inflammatory changes. Spleen: Normal in size without focal abnormality. Adrenals/Urinary Tract: Adrenal glands are unremarkable. Kidneys are normal, without renal calculi, focal lesion, or hydronephrosis. Bladder is unremarkable. Stomach/Bowel: No bowel dilatation to suggest obstruction. Diffuse colonic diverticulosis. There is mucosal thickening in the hepatic flexure and possible apple-core lesion measuring 2.5 cm. This can be better evaluated endoscopically. Unremarkable appearance of the appendix. Vascular/Lymphatic: No significant vascular findings are present. No enlarged abdominal or pelvic lymph nodes. Reproductive: Status post hysterectomy. No adnexal masses. Other: No abdominal wall hernia or abnormality. No abdominopelvic ascites. Musculoskeletal: Diffuse osteoblastic changes. Stable pathologic compression deformity L3. IMPRESSION: 1. Diffuse osteoblastic changes consistent with metastatic disease. 2. Possible colonic mucosal lesion in the hepatic flexure. This can be better evaluated endoscopically. 3. Diverticulosis. Electronically Signed   By: Fonda Field M.D.   On: 12/10/2023 22:22   DG Chest Port 1 View Result Date: 12/10/2023 CLINICAL DATA:  Questionable sepsis - evaluate for abnormality EXAM: PORTABLE CHEST - 1 VIEW COMPARISON:  12/05/2023. FINDINGS: Cardiac silhouette is unremarkable. No pneumothorax or pleural effusion. The lungs are clear. The visualized skeletal structures are unremarkable. IMPRESSION: No acute  cardiopulmonary process. Electronically Signed   By: Fonda Field M.D.   On: 12/10/2023 21:55   CT HEAD CODE STROKE WO CONTRAST Result Date: 12/10/2023 CLINICAL DATA:  Code stroke.  Acute neurologic deficit EXAM: CT HEAD WITHOUT CONTRAST TECHNIQUE: Contiguous axial images were obtained from the base of the skull through the vertex without intravenous contrast. RADIATION DOSE REDUCTION: This exam was performed according to the departmental  dose-optimization program which includes automated exposure control, adjustment of the mA and/or kV according to patient size and/or use of iterative reconstruction technique. COMPARISON:  12/05/2023 FINDINGS: Brain: There is no mass, hemorrhage or extra-axial collection. The size and configuration of the ventricles and extra-axial CSF spaces are normal. The brain parenchyma is normal, without evidence of acute or chronic infarction. Vascular: No abnormal hyperdensity of the major intracranial arteries or dural venous sinuses. No intracranial atherosclerosis. Skull: The visualized skull base, calvarium and extracranial soft tissues are normal. Sinuses/Orbits: No fluid levels or advanced mucosal thickening of the visualized paranasal sinuses. No mastoid or middle ear effusion. The orbits are normal. ASPECTS [facility] Stroke Program Early CT Score) - Ganglionic level infarction (caudate, lentiform nuclei, internal capsule, insula, M1-M3 cortex): 7 - Supraganglionic infarction (M4-M6 cortex): 3 Total score (0-10 with 10 being normal): 10 IMPRESSION: 1. No acute intracranial abnormality. 2. ASPECTS is 10. These results were communicated to Dr. Lola Jernigan at 8:11 pm on 12/10/2023 by text page via the [facility] messaging system. Electronically Signed   By: Franky Stanford M.D.   On: 12/10/2023 20:14   EEG adult Result Date: 12/06/2023 Shelton Arlin KIDD, MD     12/07/2023  9:22 AM Patient Name: TANIQUE MATNEY MRN: [id number] Epilepsy Attending: Arlin KIDD Shelton Referring Physician/Provider: Sherrill Alejandro Donovan, DO Date: 12/06/2023 Duration: 22.50 mins Patient history: 53yo F with seizure like activity. EEG to evaluate for seizure. Level of alertness: Awake, asleep AEDs during EEG study: GBP Technical aspects: This EEG study was done with scalp electrodes positioned according to the 10-20 International system of electrode placement. Electrical activity was reviewed with band pass filter of 1-70Hz , sensitivity of 7 uV/mm, display  speed of 82mm/sec with a 60Hz  notched filter applied as appropriate. EEG data were recorded continuously and digitally stored.  Video monitoring was available and reviewed as appropriate. Description: The posterior dominant rhythm consists of 9-10 Hz activity of moderate voltage (25-35 uV) seen predominantly in posterior head regions, symmetric and reactive to eye opening and eye closing. Sleep was characterized by vertex waves, sleep spindles (12 to 14 Hz), maximal frontocentral region. Physiologic photic driving was not seen during photic stimulation.  Hyperventilation was not performed.   IMPRESSION: This study is within normal limits. No seizures or epileptiform discharges were seen throughout the recording. A normal interictal EEG does not exclude the diagnosis of epilepsy. Priyanka KIDD Shelton   CT ABDOMEN PELVIS WO CONTRAST Result Date: 12/06/2023 CLINICAL DATA:  Abdominal pain, acute, nonlocalized Nausea and Vomiting Hx recent admission for similar symptoms 6/5 - 6/7 and improved with symptomatic treatment. Previous imaging with CT A/P on 6/4 with moderate stool burden but no acute findings; however PET on 6/5 did note stomach and proximal duodenal EXAM: CT ABDOMEN AND PELVIS WITHOUT CONTRAST TECHNIQUE: Multidetector CT imaging of the abdomen and pelvis was performed following the standard protocol without IV contrast. RADIATION DOSE REDUCTION: This exam was performed according to the departmental dose-optimization program which includes automated exposure control, adjustment of the mA and/or kV according to patient size and/or use of iterative reconstruction  technique. COMPARISON:  PET CT 11/24/2023 ,CT abdomen pelvis 11/23/2023 FINDINGS: Lower chest: Small hiatal hernia.  No acute abnormality. Hepatobiliary: No focal lesion. No gallstones, gallbladder wall thickening, or pericholecystic fluid. No biliary dilatation. Pancreas: Vague fat stranding along the proximal pancreas. No main pancreatic ductal  dilatation. Spleen: Normal in size without focal abnormality. Splenule is noted. Adrenals/Urinary Tract: No adrenal nodule bilaterally. No nephrolithiasis and no hydronephrosis. No definite contour-deforming renal mass. No ureterolithiasis or hydroureter. The urinary bladder is unremarkable. Stomach/Bowel: Stomach is within normal limits. No evidence of bowel wall thickening or dilatation. Colonic diverticulosis. Appendix appears normal. Vascular/Lymphatic: No abdominal aorta or iliac aneurysm. No abdominal, pelvic, or inguinal lymphadenopathy. Reproductive: Status post hysterectomy. No adnexal masses. Other: No intraperitoneal free fluid. No intraperitoneal free gas. No organized fluid collection. Musculoskeletal: No abdominal wall hernia or abnormality. Scattered appendicular and axial skeleton metastatic osseous lesions. No acute displaced fracture. Chronic compression fracture of the L3 level with likely underlying vertebral body hemangioma. Multilevel degenerative changes of the spine. IMPRESSION: 1. Vague fat stranding along the proximal pancreas. Recommend correlation with lipase levels. 2. Small hiatal hernia. 3. Colonic diverticulosis with no acute diverticulitis. 4. Scattered appendicular and axial sclerotic metastatic osseous lesions. Electronically Signed   By: Morgane  Naveau M.D.   On: 12/06/2023 00:47   DG CHEST PORT 1 VIEW Result Date: 12/05/2023 CLINICAL DATA:  SOB EXAM: PORTABLE CHEST 1 VIEW COMPARISON:  01/28/2021. FINDINGS: The heart size and mediastinal contours are within normal limits. Both lungs are clear. The visualized skeletal structures are unremarkable. IMPRESSION: No active disease. Electronically Signed   By: Fonda Field M.D.   On: 12/05/2023 19:25   MR BRAIN WO CONTRAST Result Date: 12/05/2023 CLINICAL DATA:  Mental status change, unknown cause EXAM: MRI HEAD WITHOUT CONTRAST TECHNIQUE: Multiplanar, multiecho pulse sequences of the brain and surrounding structures were  obtained without intravenous contrast. COMPARISON:  Same day CT head. FINDINGS: Brain: No acute infarction, hemorrhage, hydrocephalus, extra-axial collection or mass lesion. Mild to moderate scattered T2/FLAIR hyperintensities the white matter, nonspecific but compatible with chronic microvascular ischemic change. Vascular: Major arterial flow voids are maintained at the skull base. Skull and upper cervical spine: Normal marrow signal. Sinuses/Orbits: Right maxillary sinus retention cyst. Otherwise, largely clear sinuses. No acute orbital findings. Other: No mastoid effusions. IMPRESSION: No evidence of acute intracranial abnormality. Electronically Signed   By: Gilmore GORMAN Molt M.D.   On: 12/05/2023 18:46   DG Abd 1 View Result Date: 12/05/2023 CLINICAL DATA:  Nausea and vomiting. EXAM: ABDOMEN - 1 VIEW COMPARISON:  11/01/2013 FINDINGS: Supine abdomen shows no gaseous bowel dilatation to suggest obstruction. No unexpected abdominopelvic calcification. Degenerative changes are noted in the lumbar spine. IMPRESSION: Nonobstructive bowel gas pattern. Electronically Signed   By: Camellia Candle M.D.   On: 12/05/2023 05:16   CT Head Wo Contrast Result Date: 12/05/2023 CLINICAL DATA:  History of breast cancer presenting with diplopia with dizziness and weakness. EXAM: CT HEAD WITHOUT CONTRAST TECHNIQUE: Contiguous axial images were obtained from the base of the skull through the vertex without intravenous contrast. RADIATION DOSE REDUCTION: This exam was performed according to the departmental dose-optimization program which includes automated exposure control, adjustment of the mA and/or kV according to patient size and/or use of iterative reconstruction technique. COMPARISON:  June 29, 2022 FINDINGS: Brain: No evidence of acute infarction, hemorrhage, hydrocephalus, extra-axial collection or mass lesion/mass effect. Vascular: No hyperdense vessel or unexpected calcification. Skull: Normal. Negative for fracture  or focal lesion. Sinuses/Orbits: No acute finding.  Other: None. IMPRESSION: No acute intracranial pathology. Electronically Signed   By: Suzen Dials M.D.   On: 12/05/2023 03:29   NM PET Image Restag (PS) Skull Base To Thigh Result Date: 12/01/2023 CLINICAL DATA:  Subsequent treatment strategy for metastatic breast cancer. EXAM: NUCLEAR MEDICINE PET SKULL BASE TO THIGH TECHNIQUE: 9.92 mCi F-18 FDG was injected intravenously. Full-ring PET imaging was performed from the skull base to thigh after the radiotracer. CT data was obtained and used for attenuation correction and anatomic localization. Fasting blood glucose: 100 mg/dl COMPARISON:  PET-CT 87/79/7975. CT scan chest abdomen pelvis 11/23/2023. FINDINGS: Mediastinal blood pool activity: SUV max 3.2 Liver activity: SUV max 3.8 NECK: No specific abnormal uptake seen above blood pool in the neck including lymph node change of the submandibular, posterior triangle or internal jugular region. The previous hypermetabolic submandibular node is no longer identified. Near symmetric uptake of radiotracer along the visualized intracranial compartment. Incidental CT findings: The submandibular glands, parotid glands and thyroid  gland are unremarkable. There is some small less than 1 cm in size short axis nodes in the neck, not pathologic by size criteria and similar to previous. Right maxillary sinus mucous retention cyst or polyp. Jewelry along the tongue. CHEST: No specific abnormal radiotracer uptake seen above blood pool in the axillary region, hilum or mediastinum. Previous areas of uptake and thoracic nodal enlargement are improved from the prior PET-CT but were described as being improved on the more recent CT scan of the chest abdomen and pelvis. There is some nonspecific mild uptake along the lower esophagus. Please correlate with any symptoms. No abnormal lung uptake identified this time. Incidental CT findings: Stable cardiac size. No pericardial effusion.  The thoracic aorta is normal course and caliber. Small hiatal hernia. Small area of calcification along the SVC near the junction to the azygous. Breathing motion. No consolidation, pneumothorax or effusion. As seen previously there are several scattered tiny nodules identified in the lungs particularly the right upper lobe in a similar distribution to the recent prior examination. These appear progressive from the older CT scan as part of the PET of December 2024. No associated abnormal uptake but again these lesions are quite small. Please correlate for any clinical presentation otherwise would recommend short follow-up. ABDOMEN/PELVIS: There is physiologic distribution radiotracer along the parenchymal organs, bowel and renal collecting systems. On prior PET-CT there are some mildly enlarged hypermetabolic upper retroperitoneal nodes which again has shown improvement on the more recent CT scan and today do not show any abnormal residual uptake. There are some right inguinal nodes which show uptake today however. Example has maximum SUV value of 6.3 and on image 188 node measures 16 by 15 mm. This same node in December 2024 would measured 14 x 13 mm the and had maximum SUV of only 1.4. A new area of disease is possible but there is a differential. Incidental CT findings: Grossly the liver, spleen, adrenal glands and pancreas are unremarkable. Gallbladder is present. There is some contrast within the renal collecting systems the kidneys but this could relate to the recent CT scan of chest abdomen and pelvis. Contrast in the bladder which is relatively contracted. Normal caliber aorta and IVC. Bowel has a normal course and caliber. Scattered colonic stool. Scattered colonic diverticula. The small bowel is nondilated. Stomach is distended with fluid and debris. Questionable stranding in the area of the second portion the duodenum. There is a caliber change in the bowel this location. Please correlate with clinical  findings. There  is no specific abnormal uptake in this location. SKELETON: Once again there is some scattered sclerotic bone metastases identified particularly along the spine and pelvis. Additional areas along the sternum as well as the clavicles. By CT distribution is similar. Few areas are larger such as along the sternal region on image 88 compared to the study of December 2024. Is also some mild patchy marrow uptake identified in several areas which are increased such as along the left clavicular head with maximum SUV of 5.3. Sternal area on image 76 of 3.5 SUV maximum. T12 vertebral body maximum SUV value 5.3. Other examples as well of more uptake and more sclerosis. Incidental CT findings: Scattered degenerative changes identified. Curvature of the spine. Compression deformity of L3. IMPRESSION: Sclerotic bone metastases again identified. Several areas of sclerosis of increased in appearance compared to December 2024 and show more uptake. Distribution of disease is similar. Stable compression of L3 vertebral body. The previous uptake and increased size nodal disease on the December 2024 study has shown significant improvement. There is 1 lymph node in the right inguinal region which is slightly larger and shows some increased uptake. This could be a metastatic node but does have a differential. Please correlate with clinical findings. Once again several tiny lung nodules identified particularly in the right upper lobe. These do not show abnormal uptake. Please correlate for clinical presentation. This would have a differential and recommend attention on close follow-up. Distended stomach with fluid and debris with a caliber change at the proximal duodenum. There is slight soft tissue thickening this location without abnormal uptake. Please correlate with specific symptoms. Further workup as clinically appropriate. Electronically Signed   By: Ranell Bring M.D.   On: 12/01/2023 15:04   CT Angio Chest PE W  and/or Wo Contrast Result Date: 11/23/2023 CLINICAL DATA:  Chest pain, shortness of breath EXAM: CT ANGIOGRAPHY CHEST WITH CONTRAST TECHNIQUE: Multidetector CT imaging of the chest was performed using the standard protocol during bolus administration of intravenous contrast. Multiplanar CT image reconstructions and MIPs were obtained to evaluate the vascular anatomy. RADIATION DOSE REDUCTION: This exam was performed according to the departmental dose-optimization program which includes automated exposure control, adjustment of the mA and/or kV according to patient size and/or use of iterative reconstruction technique. CONTRAST:  OMNIPAQUE  IOHEXOL  350 MG/ML SOLN COMPARISON:  None Available. FINDINGS: Cardiovascular: Satisfactory opacification of the pulmonary arteries to the segmental level. No evidence of pulmonary embolism. Normal heart size. No pericardial effusion. Mediastinum/Nodes: No enlarged mediastinal, hilar, or axillary lymph nodes. Thyroid  gland, trachea, and esophagus demonstrate no significant findings. Lungs/Pleura: There is an ill-defined nodular pattern throughout both lungs with small small nodular densities that measured between a few mm up to 4 mm bilaterally. These findings in combination with the previously described findings of metastatic bone disease raises the possibility of metastatic lung disease. There is no other infiltrates or consolidations or pleural effusions. Upper Abdomen: No acute abnormality. Musculoskeletal: Diffuse sclerotic metastatic bone disease of the thoracic spine, sternum and ribs. Review of the MIP images confirms the above findings. IMPRESSION: *No evidence of pulmonary embolism. *Diffuse sclerotic metastatic bone disease. *Ill-defined nodular pattern throughout both lungs with small nodular densities that measured between a few mm up to 4 mm bilaterally. These findings in combination with the previously described findings of metastatic bone disease raises the  possibility of metastatic lung disease. Electronically Signed   By: Franky Chard M.D.   On: 11/23/2023 15:20   CT ABDOMEN PELVIS W CONTRAST  Result Date: 11/23/2023 CLINICAL DATA:  Abdominal pain EXAM: CT ABDOMEN AND PELVIS WITH CONTRAST TECHNIQUE: Multidetector CT imaging of the abdomen and pelvis was performed using the standard protocol following bolus administration of intravenous contrast. RADIATION DOSE REDUCTION: This exam was performed according to the departmental dose-optimization program which includes automated exposure control, adjustment of the mA and/or kV according to patient size and/or use of iterative reconstruction technique. CONTRAST:  OMNIPAQUE  IOHEXOL  350 MG/ML SOLN COMPARISON:  CT abdomen and pelvis August 23, 2022 FINDINGS: Lower chest: No infiltrates or consolidations, no pleural effusions Hepatobiliary: Liver normal size no masses no biliary dilatation. Gallbladder unremarkable. No gallstones. Pancreas: Pancreas normal size. No masses calcifications or inflammatory changes. Spleen: Spleen normal size.  No masses. Adrenals/Urinary Tract: Adrenal glands are normal size. Follow-up recommended. Kidneys are normal. No masses calcifications or hydronephrosis Stomach/Bowel: No small or large bowel obstruction or inflammatory changes. Diffuse pan diverticulosis unchanged since prior. Moderate amount of residual fecal material throughout the colon without obstruction or constipation. Vascular/Lymphatic: No significant vascular findings are present. No enlarged abdominal or pelvic lymph nodes. Reproductive: .  No masses. Bladder unremarkable. Other: Anterior abdominal wall unremarkable without evidence of umbilical or inguinal hernias Musculoskeletal: Again noted multiple sclerotic lesions involving the thoracolumbar spine with fracture deformity of L3 correlates with metastatic bone disease. IMPRESSION: *No acute findings in the abdomen or pelvis. *Diffuse pan diverticulosis unchanged since  prior. *Moderate amount of residual fecal material throughout the colon without obstruction or constipation. *Again noted multiple sclerotic lesions involving the thoracolumbar spine with fracture deformity of L3 correlates with metastatic bone disease. Electronically Signed   By: Franky Chard M.D.   On: 11/23/2023 15:17

## 2023-12-12 NOTE — Evaluation (Signed)
 Occupational Therapy Evaluation Patient Details Name: Chelsea Hansen MRN: [id number] DOB: Apr 06, 1971 Today's Date: 12/12/2023   History of Present Illness   Pt is a 53 year old woman admitted on 6/21 with intractable vomiting and stroke like symptoms related to hypocalcemia, + AKI. Pt recently discharged from [facility] with similar symptoms. 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     Clinical Impressions Pt is typically independent, drives and works as a Production designer, theatre/television/film at [employer]. She lives alone and will discharge to her daughter's home. Pt presents with generalized weakness, decreased activity tolerance and lightheadedness with ambulation in hall, BP monitored and stable. Pt require up to [facility] for ADL and mobility. Anticipate pt will progress well and not require post acute OT. Recommend tub seat.      If plan is discharge home, recommend the following:   A lot of help with bathing/dressing/bathroom;Two people to help with bathing/dressing/bathroom;Assist for transportation;Help with stairs or ramp for entrance     Functional Status Assessment   Patient has had a recent decline in their functional status and demonstrates the ability to make significant improvements in function in a reasonable and predictable amount of time.     Equipment Recommendations   Tub/shower seat     Recommendations for Other Services         Precautions/Restrictions   Precautions Precautions: Fall Recall of Precautions/Restrictions: Intact Restrictions Weight Bearing Restrictions Per Provider Order: No     Mobility Bed Mobility Overal bed mobility: Modified Independent                  Transfers Overall transfer level: Needs assistance Equipment used: Rolling walker (2 wheels) Transfers: Sit to/from Stand Sit to Stand: Contact guard assist           General transfer comment: cues for hand placement      Balance  Overall balance assessment: Needs assistance   Sitting balance-Leahy Scale: Good       Standing balance-Leahy Scale: Poor                             ADL either performed or assessed with clinical judgement   ADL Overall ADL's : Needs assistance/impaired Eating/Feeding: Independent   Grooming: Contact guard assist;Standing   Upper Body Bathing: Set up;Sitting   Lower Body Bathing: Contact guard assist;Sit to/from stand   Upper Body Dressing : Set up;Sitting   Lower Body Dressing: Set up;Sit to/from stand   Toilet Transfer: Contact guard assist;Ambulation;Rolling walker (2 wheels)           Functional mobility during ADLs: Contact guard assist;Rolling walker (2 wheels)       Vision Ability to See in Adequate Light: 0 Adequate Patient Visual Report: No change from baseline       Perception         Praxis         Pertinent Vitals/Pain Pain Assessment Pain Assessment: No/denies pain     Extremity/Trunk Assessment Upper Extremity Assessment Upper Extremity Assessment: Overall WFL for tasks assessed   Lower Extremity Assessment Lower Extremity Assessment: Defer to PT evaluation   Cervical / Trunk Assessment Cervical / Trunk Assessment: Normal;Other exceptions Cervical / Trunk Exceptions: mets in spine   Communication Communication Communication: No apparent difficulties   Cognition Arousal: Alert Behavior During Therapy: Flat affect Cognition: No apparent impairments  Following commands: Intact       Cueing  General Comments   Cueing Techniques: Verbal cues      Exercises     Shoulder Instructions      Home Living Family/patient expects to be discharged to:: Private residence Living Arrangements: Children Available Help at Discharge: Family;Available 24 hours/day Type of Home: House Home Access: Stairs to enter [employer] of Steps: 6-8 Entrance Stairs-Rails: Can  reach both Home Layout: Two level;Able to live on main level with bedroom/bathroom;1/2 bath on main level Alternate Level Stairs-Number of Steps: 12 Alternate Level Stairs-Rails: Right Bathroom Shower/Tub: Chief Strategy Officer: Standard Bathroom Accessibility: Yes   Home Equipment: None   Additional Comments: plans to discharge to her daughter's home      Prior Functioning/Environment Prior Level of Function : Independent/Modified Independent;Working/employed;Driving             Mobility Comments: Ind ADLs Comments: Production designer, theatre/television/film at [employer]    OT Problem List: Decreased activity tolerance;Decreased strength;Impaired balance (sitting and/or standing)   OT Treatment/Interventions: Self-care/ADL training;DME and/or AE instruction;Therapeutic activities;Patient/family education;Balance training;Energy conservation      OT Goals(Current goals can be found in the care plan section)   Acute Rehab OT Goals OT Goal Formulation: With patient Time For Goal Achievement: 12/26/23 Potential to Achieve Goals: Good ADL Goals Pt Will Perform Grooming: with modified independence;standing Pt Will Perform Lower Body Bathing: with modified independence;sit to/from stand Pt Will Perform Lower Body Dressing: with modified independence;sit to/from stand Pt Will Transfer to Toilet: with modified independence;ambulating;regular height toilet Pt Will Perform Toileting - Clothing Manipulation and hygiene: with modified independence;sit to/from stand Pt Will Perform Tub/Shower Transfer: ambulating;shower seat;rolling walker Additional ADL Goal #1: Pt will state at least 3 energy conservation strategies as instructed.   OT Frequency:  Min 2X/week    Co-evaluation              AM-PAC OT 6 Clicks Daily Activity     Outcome Measure Help from another person eating meals?: None Help from another person taking care of personal grooming?: A Little Help from another person toileting,  which includes using toliet, bedpan, or urinal?: A Little Help from another person bathing (including washing, rinsing, drying)?: A Little Help from another person to put on and taking off regular upper body clothing?: A Little Help from another person to put on and taking off regular lower body clothing?: A Little 6 Click Score: 19   End of Session Equipment Utilized During Treatment: Gait belt;Rolling walker (2 wheels)  Activity Tolerance: Other (comment) (dizziness, with stable BP) Patient left: in chair;with call bell/phone within reach;with family/visitor present  OT Visit Diagnosis: Unsteadiness on feet (R26.81);Other abnormalities of gait and mobility (R26.89);Muscle weakness (generalized) (M62.81);Other (comment) (decreased activity tolerance)                Time: 8986-8961 OT Time Calculation (min): 25 min Charges:  OT General Charges $OT Visit: 1 Visit OT Evaluation $OT Eval Low Complexity: 1 Low  Mliss HERO, OTR/L Acute Rehabilitation Services Office: [phone]   Kennth Mliss Helling 12/12/2023, 11:39 AM

## 2023-12-12 NOTE — Plan of Care (Signed)
 Pt has rested quietly throughout the night with no distress noted. Alert and oriented. On room air. SR on the monitor. Up to [facility] to void. Medicated for nausea twice with relief noted and for anxiety with relief noted. No  other complaints voiced.     Problem: Education: Goal: Knowledge of General Education information will improve Description: Including pain rating scale, medication(s)/side effects and non-pharmacologic comfort measures 12/12/2023 0633 by Boston Morrison, RN Outcome: Progressing 12/12/2023 0612 by Boston Morrison, RN Outcome: Progressing   Problem: Clinical Measurements: Goal: Respiratory complications will improve Outcome: Progressing Goal: Cardiovascular complication will be avoided Outcome: Progressing   Problem: Coping: Goal: Level of anxiety will decrease Outcome: Progressing   Problem: Pain Managment: Goal: General experience of comfort will improve and/or be controlled Outcome: Progressing

## 2023-12-12 NOTE — Anesthesia Preprocedure Evaluation (Signed)
 Anesthesia Evaluation  Patient identified by MRN, date of birth, ID band Patient awake    Reviewed: Allergy & Precautions, NPO status , Patient's Chart, lab work & pertinent test results  Airway Mallampati: II  TM Distance: >3 FB Neck ROM: Full    Dental  (+) Chipped, Dental Advisory Given, Poor Dentition,    Pulmonary asthma , neg recent URI   Pulmonary exam normal breath sounds clear to auscultation       Cardiovascular hypertension, Pt. on medications and Pt. on home beta blockers + DVT  Normal cardiovascular exam Rhythm:Regular Rate:Normal  Echo 01/2021  1. Left ventricular ejection fraction, by estimation, is 60 to 65%. The left ventricle has normal function. The left ventricle has no regional wall motion abnormalities. Left ventricular diastolic parameters were normal.   2. Right ventricular systolic function is normal. The right ventricular size is normal.   3. Left atrial size was mildly dilated.   4. The mitral valve is normal in structure. Trivial mitral valve  regurgitation. No evidence of mitral stenosis.   5. The aortic valve was not well visualized. There is mild calcification of the aortic valve. Aortic valve regurgitation is not visualized. No aortic stenosis is present.   6. The inferior vena cava is normal in size with greater than 50% respiratory variability, suggesting right atrial pressure of 3 mmHg.      Neuro/Psych  Headaches PSYCHIATRIC DISORDERS Anxiety Depression       GI/Hepatic Neg liver ROS,GERD  Medicated,,  Endo/Other  negative endocrine ROS    Renal/GU Renal disease     Musculoskeletal negative musculoskeletal ROS (+)    Abdominal   Peds  Hematology  (+) Blood dyscrasia, anemia Lab Results      Component                Value               Date                      WBC                      3.8 (L)             06/28/2022                HGB                      9.2 (L)              06/29/2022                HCT                      30.1 (L)            06/29/2022                MCV                      77.5 (L)            06/28/2022                PLT                      333                 06/28/2022  Anesthesia Other Findings No emesis today  Reproductive/Obstetrics Breast CA with spine mets on chemo/radiation                             Anesthesia Physical Anesthesia Plan  ASA: 3  Anesthesia Plan: MAC   Post-op Pain Management: Minimal or no pain anticipated   Induction: Intravenous  PONV Risk Score and Plan: 2 and Propofol  infusion, Treatment may vary due to age or medical condition and TIVA  Airway Management Planned: Simple Face Mask  Additional Equipment:   Intra-op Plan:   Post-operative Plan:   Informed Consent: I have reviewed the patients History and Physical, chart, labs and discussed the procedure including the risks, benefits and alternatives for the proposed anesthesia with the patient or authorized representative who has indicated his/her understanding and acceptance.     Dental advisory given  Plan Discussed with: CRNA  Anesthesia Plan Comments:        Anesthesia Quick Evaluation

## 2023-12-12 NOTE — Telephone Encounter (Signed)
 Noted.  Currently in hospital

## 2023-12-12 NOTE — Plan of Care (Signed)

## 2023-12-12 NOTE — Progress Notes (Addendum)
 PROGRESS NOTE                                                                                                                                                                                                             Patient Demographics:    Chelsea Hansen, is a 53 y.o. female, DOB - 19-Apr-1971, FMW:[id number]  Outpatient Primary MD for the patient is [name], NP    LOS - 2  Admit date - 12/10/2023    Chief Complaint  Patient presents with   Code Stroke       Brief Narrative (HPI from H&P)   53 y.o. female with medical history significant of CKD stage IIIa, history of recurrent hospital admission for intractable nausea vomiting with associated electrolyte derangement, supraventricular tachycardia, asthma, essential hypertension, hyperlipidemia, chronic pulmonary nodule, cancer related pain and metastatic breast cancer status post neoadjuvant chemotherapy, lumpectomy, SNL biopsy status post radiation currently on anastrozole  and abemaciclib , recent issues with recurrent nausea vomiting requiring admissions.   Presented to emergency department accompanied by EMS with concern for stroke.  Patient son reported last known well 11 AM 6/21.  EMS reported aphasia worsening throughout transport and left-sided facial droop/asymmetry.  She also has continued nausea vomiting at home, further workup in the ER rule out stroke but confirmed severe hypocalcemia likely causing tetany and facial asymmetry from muscle spasm.  She was seen by neurology, GI and admitted to the hospital.   Subjective:    Chelsea Hansen today has, No headache, No chest pain, No abdominal pain - No Nausea, No new weakness tingling or numbness, no SOB   Assessment  & Plan :    Facial asymmetry caused by tetany due to severe hypocalcemia - she has been having issues with recurrent nausea vomiting requiring multiple hospital admissions, she did have nausea  vomiting prior to this admission as well likely causing dehydration and hypocalcemia, aggressive calcium  replacement, head CT unremarkable seen by neurology, stroke ruled out.  Currently neurologically back to baseline.  Continue electrolyte replacement  Recurrent nausea vomiting with severe dehydration, AKI, hypokalemia, hypocalcemia, hypomagnesemia.  GI on board, CT scan abdomen pelvis noted with possible mucosal lesion in the hepatic flexure, due for EGD on 12/13/2023, clear liquids and supportive care for now, replace electrolytes and continue IV fluids for hydration.  History of metastatic breast cancer.  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  Case discussed with oncologist Dr. Godino who request chest CT noncontrast for staging, he also wants to make sure that patient's home medication Verzinio has been stopped permanently.  Chronic cancer patient with narcotic bowel.  Supportive care, bowel regimen continued.  Hx of hypertension, SVT.  Once blood pressure stabilizes add low-dose beta-blocker.        Condition -  Guarded  Family Communication  :  None present  Code Status :  Full  Consults  :  GI, Onc, neurology  PUD Prophylaxis :  PPI   Procedures  :     CT - 1. Diffuse osteoblastic changes consistent with metastatic disease. 2. Possible colonic mucosal lesion in the hepatic flexure. This can be better evaluated endoscopically. 3. Diverticulosis      Disposition Plan  :    Status is: Inpatient  DVT Prophylaxis  :    Place and maintain sequential compression device Start: 12/11/23 0708 SCDs Start: 12/10/23 2306 Place TED hose Start: 12/10/23 2306    Lab Results  Component Value Date   PLT 223 12/11/2023    Diet :  Diet Order             Diet clear liquid Room service  appropriate? Yes; Fluid consistency: Thin  Diet effective now                    Inpatient Medications  Scheduled Meds:  anastrozole   1 mg Oral Daily   calcitRIOL  0.5 mcg Oral Daily   cholecalciferol   1,000 Units Oral Daily   methylnaltrexone  6 mg Subcutaneous Q48H   pantoprazole  (PROTONIX ) IV  40 mg Intravenous Q12H   polyethylene glycol  17 g Oral Daily   potassium chloride   40 mEq Oral BID   senna-docusate  1 tablet Oral BID   Continuous Infusions:  calcium  gluconate 4 g (12/12/23 0823)   PRN Meds:.acetaminophen , LORazepam , metoprolol  tartrate, oxyCODONE -acetaminophen , prochlorperazine , trimethobenzamide    Objective:   Vitals:   12/11/23 1454 12/11/23 1523 12/11/23 2050 12/12/23 0347  BP: 105/66 106/63 111/71 93/68  Pulse: 65 62    Resp: 17  18 18   Temp: (!) 97.5 F (36.4 C) 97.7 F (36.5 C) 98.6 F (37 C) 99.1 F (37.3 C)  TempSrc: Oral Oral Oral Oral  SpO2: 100% 97%    Weight:        Wt Readings from Last 3 Encounters:  12/10/23 81.4 kg  12/05/23 85.5 kg  12/01/23 85.5 kg     Intake/Output Summary (Last 24 hours) at 12/12/2023 0837 Last data filed at 12/11/2023 1216 Gross per 24 hour  Intake 1000 ml  Output --  Net 1000 ml     Physical Exam  Awake Alert, No new F.N deficits, Normal affect [place].AT,PERRAL Supple Neck, No JVD,   Symmetrical Chest wall movement, Good air movement bilaterally, CTAB RRR,No Gallops,Rubs or new Murmurs,  +ve B.Sounds, Abd Soft, No tenderness,   No Cyanosis, Clubbing or edema        Data Review:    Recent Labs  Lab 12/05/23 1705 12/06/23 0638 12/07/23 0555 12/10/23 2004 12/10/23 2005 12/11/23 0518  WBC 5.5 4.5 5.1 10.3  --  9.1  HGB 10.6* 10.3* 11.3* 13.6 16.0* 10.7*  HCT 34.6* 33.8* 35.5* 43.0 47.0* 34.1*  PLT 253 220 209 324  --  223  MCV 77.4* 76.5* 75.1* 75.2*  --  75.1*  MCH 23.7* 23.3* 23.9* 23.8*  --  23.6*  MCHC 30.6 30.5 31.8 31.6  --  31.4  RDW 14.4 14.2 14.2 14.2  --  14.2  LYMPHSABS 1.0  0.9 1.0 2.7  --   --   MONOABS 0.7 0.4 0.6 0.7  --   --   EOSABS 0.0 0.1 0.1 0.0  --   --   BASOSABS 0.0 0.0 0.1 0.0  --   --     Recent Labs  Lab 12/05/23 1416 12/05/23 1705 12/06/23 0638 12/07/23 0555 12/10/23 2004 12/10/23 2005 12/10/23 2048 12/11/23 0045 12/11/23 0052 12/11/23 0518 12/11/23 0818  NA 135 134* 132* 132* 137 132*  --  135  --  135  --   K 2.9* 2.6* 3.3* 3.2* 3.5 3.3*  --  3.0*  --  3.0*  --   CL 88* 86* 89* 92* 73* 76*  --  81*  --  86*  --   CO2 31 35* 29 27 27   --   --  39*  --  35*  --   ANIONGAP 16* 13 14 13  37*  --   --  15  --  14  --   GLUCOSE 88 109* 91 94 238* 231*  --  106*  --  100*  --   BUN 41* 38* 28* 20 50* 49*  --  49*  --  44*  --   CREATININE 1.74* 1.55* 1.04* 0.80 5.60* 5.30*  --  3.84*  --  2.93*  --   AST  --  57* 51* 40 98*  --   --   --   --  80*  --   ALT  --  79* 71* 59* 96*  --   --   --   --  79*  --   ALKPHOS  --  163* 160* 166* 164*  --   --   --   --  135*  --   BILITOT  --  0.8 0.8 1.0 1.0  --   --   --   --  1.0  --   ALBUMIN  --  3.6 3.7 3.7 4.4  --   --   --   --  3.5  --   LATICACIDVEN  --   --   --   --   --   --   --  1.6 1.6  --   --   INR  --   --   --   --  1.1  --   --   --   --   --   --   MG 2.5* 2.4 2.5* 2.5*  --   --  2.3  --   --  2.4 2.1  PHOS 4.0 3.2 2.5 3.2  --   --  4.8*  --   --   --   --   CALCIUM  6.7* 6.9* 7.0* 8.1* 6.7*  --   --  6.4*  --  6.6*  --       Recent Labs  Lab 12/06/23 0638 12/07/23 0555 12/10/23 2004 12/10/23 2048 12/11/23 0045 12/11/23 0052 12/11/23 0518 12/11/23 0818  LATICACIDVEN  --   --   --   --  1.6 1.6  --   --   INR  --   --  1.1  --   --   --   --   --  MG 2.5* 2.5*  --  2.3  --   --  2.4 2.1  CALCIUM  7.0* 8.1* 6.7*  --  6.4*  --  6.6*  --     --------------------------------------------------------------------------------------------------------------- Lab Results  Component Value Date   CHOL 226 (H) 10/06/2020   HDL 51 10/06/2020   LDLCALC 140 (H) 10/06/2020    TRIG 196 (H) 10/06/2020   CHOLHDL 4.4 10/06/2020    No results found for: HGBA1C No results for input(s): TSH, T4TOTAL, FREET4, T3FREE, THYROIDAB in the last 72 hours. No results for input(s): VITAMINB12, FOLATE, FERRITIN, TIBC, IRON, RETICCTPCT in the last 72 hours. ------------------------------------------------------------------------------------------------------------------ Cardiac Enzymes No results for input(s): CKMB, TROPONINI, MYOGLOBIN in the last 168 hours.  Invalid input(s): CK  Micro Results Recent Results (from the past 240 hours)  Blood Culture (routine x 2)     Status: None (Preliminary result)   Collection Time: 12/10/23  8:22 PM   Specimen: BLOOD RIGHT HAND  Result Value Ref Range Status   Specimen Description BLOOD RIGHT HAND  Final   Special Requests   Final    BOTTLES DRAWN AEROBIC AND ANAEROBIC Blood Culture adequate volume   Culture   Final    NO GROWTH < 12 HOURS Performed at [facility], [address] [place], [place] [address]    Report Status PENDING  Incomplete  Resp panel by RT-PCR (RSV, Flu A&B, Covid) Anterior Nasal Swab     Status: None   Collection Time: 12/10/23  8:48 PM   Specimen: Anterior Nasal Swab  Result Value Ref Range Status   SARS Coronavirus 2 by RT PCR NEGATIVE NEGATIVE Final   Influenza A by PCR NEGATIVE NEGATIVE Final   Influenza B by PCR NEGATIVE NEGATIVE Final    Comment: (NOTE) The Xpert Xpress SARS-CoV-2/FLU/RSV plus assay is intended as an aid in the diagnosis of influenza from Nasopharyngeal swab specimens and should not be used as a sole basis for treatment. Nasal washings and aspirates are unacceptable for Xpert Xpress SARS-CoV-2/FLU/RSV testing.  Fact Sheet for Patients: BloggerCourse.com  Fact Sheet for Healthcare Providers: SeriousBroker.it  This test is not yet approved or cleared by the [place]  FDA and has been  authorized for detection and/or diagnosis of SARS-CoV-2 by FDA under an Emergency Use Authorization (EUA). This EUA will remain in effect (meaning this test can be used) for the duration of the COVID-19 declaration under Section 564(b)(1) of the Act, 21 U.S.C. section 360bbb-3(b)(1), unless the authorization is terminated or revoked.     Resp Syncytial Virus by PCR NEGATIVE NEGATIVE Final    Comment: (NOTE) Fact Sheet for Patients: BloggerCourse.com  Fact Sheet for Healthcare Providers: SeriousBroker.it  This test is not yet approved or cleared by the [place]  FDA and has been authorized for detection and/or diagnosis of SARS-CoV-2 by FDA under an Emergency Use Authorization (EUA). This EUA will remain in effect (meaning this test can be used) for the duration of the COVID-19 declaration under Section 564(b)(1) of the Act, 21 U.S.C. section 360bbb-3(b)(1), unless the authorization is terminated or revoked.  Performed at [facility], [address], [place], [place] [address]   Blood Culture (routine x 2)     Status: None (Preliminary result)   Collection Time: 12/10/23  8:48 PM   Specimen: BLOOD  Result Value Ref Range Status   Specimen Description BLOOD RIGHT ANTECUBITAL  Final   Special Requests   Final    BOTTLES DRAWN AEROBIC AND ANAEROBIC Blood Culture adequate  volume   Culture   Final    NO GROWTH < 12 HOURS Performed at [facility], [address], [place], [place] [address]    Report Status PENDING  Incomplete    Radiology Report DG Abd Portable 1V Result Date: 12/12/2023 EXAM: 1 VIEW XRAY OF THE ABDOMEN SUPINE 12/12/2023 06:03:00 AM COMPARISON: 1 view abdomen 01/04/2024. CT of the abdomen and pelvis 12/10/2023 and 12/06/2023. CLINICAL HISTORY: Nausea and vomiting. FINDINGS: BOWEL: Nonobstructive bowel gas pattern. PERITONEUM AND SOFT TISSUES: No abnormal calcifications. BONES: No acute osseous  abnormality. IMPRESSION: 1. No acute findings. Electronically signed by: Lonni Necessary MD 12/12/2023 06:21 AM EDT RP Workstation: [id number]   CT ABDOMEN PELVIS WO CONTRAST Result Date: 12/10/2023 CLINICAL DATA:  Abdominal pain, vomiting. EXAM: CT ABDOMEN AND PELVIS WITHOUT CONTRAST TECHNIQUE: Multidetector CT imaging of the abdomen and pelvis was performed following the standard protocol without IV contrast. RADIATION DOSE REDUCTION: This exam was performed according to the departmental dose-optimization program which includes automated exposure control, adjustment of the mA and/or kV according to patient size and/or use of iterative reconstruction technique. COMPARISON:  0/17/2025. FINDINGS: Lower chest: No acute abnormality. No pleural or pericardial effusion. Hepatobiliary: No focal liver abnormality is seen. No gallstones, gallbladder wall thickening, or biliary dilatation. Pancreas: Unremarkable. No pancreatic ductal dilatation or surrounding inflammatory changes. Spleen: Normal in size without focal abnormality. Adrenals/Urinary Tract: Adrenal glands are unremarkable. Kidneys are normal, without renal calculi, focal lesion, or hydronephrosis. Bladder is unremarkable. Stomach/Bowel: No bowel dilatation to suggest obstruction. Diffuse colonic diverticulosis. There is mucosal thickening in the hepatic flexure and possible apple-core lesion measuring 2.5 cm. This can be better evaluated endoscopically. Unremarkable appearance of the appendix. Vascular/Lymphatic: No significant vascular findings are present. No enlarged abdominal or pelvic lymph nodes. Reproductive: Status post hysterectomy. No adnexal masses. Other: No abdominal wall hernia or abnormality. No abdominopelvic ascites. Musculoskeletal: Diffuse osteoblastic changes. Stable pathologic compression deformity L3. IMPRESSION: 1. Diffuse osteoblastic changes consistent with metastatic disease. 2. Possible colonic mucosal lesion in the hepatic  flexure. This can be better evaluated endoscopically. 3. Diverticulosis. Electronically Signed   By: Fonda Field M.D.   On: 12/10/2023 22:22   DG Chest Port 1 View Result Date: 12/10/2023 CLINICAL DATA:  Questionable sepsis - evaluate for abnormality EXAM: PORTABLE CHEST - 1 VIEW COMPARISON:  12/05/2023. FINDINGS: Cardiac silhouette is unremarkable. No pneumothorax or pleural effusion. The lungs are clear. The visualized skeletal structures are unremarkable. IMPRESSION: No acute cardiopulmonary process. Electronically Signed   By: Fonda Field M.D.   On: 12/10/2023 21:55   CT HEAD CODE STROKE WO CONTRAST Result Date: 12/10/2023 CLINICAL DATA:  Code stroke.  Acute neurologic deficit EXAM: CT HEAD WITHOUT CONTRAST TECHNIQUE: Contiguous axial images were obtained from the base of the skull through the vertex without intravenous contrast. RADIATION DOSE REDUCTION: This exam was performed according to the departmental dose-optimization program which includes automated exposure control, adjustment of the mA and/or kV according to patient size and/or use of iterative reconstruction technique. COMPARISON:  12/05/2023 FINDINGS: Brain: There is no mass, hemorrhage or extra-axial collection. The size and configuration of the ventricles and extra-axial CSF spaces are normal. The brain parenchyma is normal, without evidence of acute or chronic infarction. Vascular: No abnormal hyperdensity of the major intracranial arteries or dural venous sinuses. No intracranial atherosclerosis. Skull: The visualized skull base, calvarium and extracranial soft tissues are normal. Sinuses/Orbits: No fluid levels or advanced mucosal thickening of the visualized paranasal sinuses. No mastoid or middle ear  effusion. The orbits are normal. ASPECTS [facility] Stroke Program Early CT Score) - Ganglionic level infarction (caudate, lentiform nuclei, internal capsule, insula, M1-M3 cortex): 7 - Supraganglionic infarction (M4-M6 cortex): 3  Total score (0-10 with 10 being normal): 10 IMPRESSION: 1. No acute intracranial abnormality. 2. ASPECTS is 10. These results were communicated to Dr. Lola Jernigan at 8:11 pm on 12/10/2023 by text page via the [facility] messaging system. Electronically Signed   By: Franky Stanford M.D.   On: 12/10/2023 20:14     Signature  -   Lavada Stank M.D on 12/12/2023 at 8:37 AM   -  To page go to [email or web address]

## 2023-12-12 NOTE — Progress Notes (Signed)
 Daily Progress Note  DOA: 12/10/2023 Hospital Day: 3   Cc:   anemia    ASSESSMENT    53 yo female with metastatic breast cancer admitted with persistent nausea / vomiting with associated electrolyte abnormalities. Recent PET scan showing distended stomach abnormal duodenum (without abnormal uptake).   Possible colonic lesion on noncontrast CT scan .  Possible apple core lesion measuring 2.5 cm in the hepatic flexure.  PET scan on 12/01/2023 without activity in that area.  She had a screening colonoscopy May 2022.  Bowel prep was fair.  A sessile 7 mm nonadenomatous polyp was removed in the transverse colon   Severe hypocalcemia (POA) She had associated facial asymmetry due to tetany.  Ionized calcium  was 0.61 .  Treated with calcium  gluconate.  Neurologically back to baseline  Constipation Receiving Relistor and MiraLAX .  She has declined Senokot   Principal Problem:   Acute kidney injury superimposed on chronic kidney disease (HCC) Active Problems:   Breast cancer (HCC)   Chronic pain syndrome (breast cancer)   Supraventricular tachycardia (HCC)   HLD (hyperlipidemia)   Essential hypertension   Intractable nausea and vomiting   QT prolongation   Hypocalcemia   Fever of unknown origin   Sinus tachycardia   Therapeutic opioid-induced constipation (OIC)   PLAN   --Proceed with small bowel enteroscopy tomorrow.  The risks and benefits of small bowel enteroscopy with possible biopsies were discussed with the patient who agrees to proceed.  -- Significance of the hepatic flexure lesion seen on noncontrast CT scan is unclear especially given that no activity was demonstrated on PET scan earlier this month.  Depending on clinical course we could pursue colonoscopy at a later date.  I do not think she can tolerate the bowel prep now due to nausea/vomiting  Subjective   Antiemetics are helping with the nausea and vomiting.    Objective    Recent Labs     12/10/23 2004 12/10/23 2005 12/11/23 0518  WBC 10.3  --  9.1  HGB 13.6 16.0* 10.7*  HCT 43.0 47.0* 34.1*  MCV 75.2*  --  75.1*  PLT 324  --  223   No results for input(s): FOLATE, VITAMINB12, FERRITIN, TIBC, IRONPCTSAT in the last 72 hours. Recent Labs    12/10/23 2004 12/10/23 2005 12/11/23 0045 12/11/23 0518  NA 137 132* 135 135  K 3.5 3.3* 3.0* 3.0*  CL 73* 76* 81* 86*  CO2 27  --  39* 35*  GLUCOSE 238* 231* 106* 100*  BUN 50* 49* 49* 44*  CREATININE 5.60* 5.30* 3.84* 2.93*  CALCIUM  6.7*  --  6.4* 6.6*   Recent Labs    12/10/23 2004 12/11/23 0518  PROT 9.1* 6.9  ALBUMIN 4.4 3.5  AST 98* 80*  ALT 96* 79*  ALKPHOS 164* 135*  BILITOT 1.0 1.0      Imaging:  DG Abd Portable 1V EXAM: 1 VIEW XRAY OF THE ABDOMEN SUPINE 12/12/2023 06:03:00 AM  COMPARISON: 1 view abdomen 01/04/2024. CT of the abdomen and pelvis 12/10/2023 and 12/06/2023.  CLINICAL HISTORY: Nausea and vomiting.  FINDINGS:  BOWEL: Nonobstructive bowel gas pattern.  PERITONEUM AND SOFT TISSUES: No abnormal calcifications.  BONES: No acute osseous abnormality.  IMPRESSION: 1. No acute findings.  Electronically signed by: Lonni Necessary MD 12/12/2023 06:21 AM EDT RP Workstation: [id number]     Scheduled inpatient medications:   anastrozole   1 mg Oral Daily   calcitRIOL  0.5 mcg Oral Daily   cholecalciferol   1,000 Units Oral Daily   methylnaltrexone  6 mg Subcutaneous Q48H   pantoprazole  (PROTONIX ) IV  40 mg Intravenous Q12H   polyethylene glycol  17 g Oral Daily   potassium chloride   40 mEq Oral BID   senna-docusate  1 tablet Oral BID   Continuous inpatient infusions:   sodium chloride      lactated ringers  1,000 mL with potassium chloride  20 mEq infusion 75 mL/hr at 12/12/23 1104   PRN inpatient medications: acetaminophen , LORazepam , metoprolol  tartrate, oxyCODONE -acetaminophen , prochlorperazine , trimethobenzamide  Vital signs in last 24 hours: Temp:  [98.3  F (36.8 C)-99.1 F (37.3 C)] 98.3 F (36.8 C) (06/23 1319) Pulse Rate:  [73] 73 (06/23 0810) Resp:  [18] 18 (06/23 1319) BP: (93-111)/(62-73) 105/73 (06/23 1319) SpO2:  [96 %] 96 % (06/23 0810) Last BM Date : 12/11/23 No intake or output data in the 24 hours ending 12/12/23 1654  Intake/Output from previous day: 06/22 0701 - 06/23 0700 In: 1000 [IV Piggyback:1000] Out: -  Intake/Output this shift: No intake/output data recorded.   Physical Exam:  General: Alert female in NAD Heart:  Regular rate and rhythm.  Pulmonary: Normal respiratory effort Abdomen: Soft, mildly distended, nontender. Normal bowel sounds. Extremities: No lower extremity edema  Neurologic: Alert and oriented Psych: Pleasant. Cooperative     LOS: 2 days   Vina Dasen ,NP 12/12/2023, 4:54 PM

## 2023-12-13 ENCOUNTER — Inpatient Hospital Stay (HOSPITAL_COMMUNITY): Payer: MEDICAID | Admitting: Anesthesiology

## 2023-12-13 ENCOUNTER — Ambulatory Visit (INDEPENDENT_AMBULATORY_CARE_PROVIDER_SITE_OTHER): Payer: MEDICAID | Admitting: Primary Care

## 2023-12-13 ENCOUNTER — Encounter (HOSPITAL_COMMUNITY)
Admission: EM | Disposition: A | Discharge status: Home / Self Care | Payer: Self-pay | Source: Home / Self Care | Attending: Internal Medicine

## 2023-12-13 ENCOUNTER — Other Ambulatory Visit (HOSPITAL_COMMUNITY): Payer: Self-pay

## 2023-12-13 DIAGNOSIS — J45909 Unspecified asthma, uncomplicated: Secondary | ICD-10-CM | POA: Diagnosis not present

## 2023-12-13 DIAGNOSIS — K315 Obstruction of duodenum: Secondary | ICD-10-CM

## 2023-12-13 DIAGNOSIS — K449 Diaphragmatic hernia without obstruction or gangrene: Secondary | ICD-10-CM

## 2023-12-13 DIAGNOSIS — K2980 Duodenitis without bleeding: Secondary | ICD-10-CM

## 2023-12-13 DIAGNOSIS — N189 Chronic kidney disease, unspecified: Secondary | ICD-10-CM | POA: Diagnosis not present

## 2023-12-13 DIAGNOSIS — F418 Other specified anxiety disorders: Secondary | ICD-10-CM

## 2023-12-13 DIAGNOSIS — K2990 Gastroduodenitis, unspecified, without bleeding: Secondary | ICD-10-CM

## 2023-12-13 DIAGNOSIS — K2210 Ulcer of esophagus without bleeding: Secondary | ICD-10-CM

## 2023-12-13 DIAGNOSIS — T182XXA Foreign body in stomach, initial encounter: Secondary | ICD-10-CM | POA: Diagnosis not present

## 2023-12-13 DIAGNOSIS — I10 Essential (primary) hypertension: Secondary | ICD-10-CM | POA: Diagnosis not present

## 2023-12-13 DIAGNOSIS — B3781 Candidal esophagitis: Secondary | ICD-10-CM | POA: Diagnosis not present

## 2023-12-13 DIAGNOSIS — K222 Esophageal obstruction: Secondary | ICD-10-CM

## 2023-12-13 DIAGNOSIS — N179 Acute kidney failure, unspecified: Secondary | ICD-10-CM | POA: Diagnosis not present

## 2023-12-13 DIAGNOSIS — K2950 Unspecified chronic gastritis without bleeding: Secondary | ICD-10-CM

## 2023-12-13 HISTORY — PX: BALLOON DILATION: SHX5330

## 2023-12-13 HISTORY — PX: BIOPSY OF SKIN SUBCUTANEOUS TISSUE AND/OR MUCOUS MEMBRANE: SHX6741

## 2023-12-13 HISTORY — PX: ESOPHAGOGASTRODUODENOSCOPY: SHX5428

## 2023-12-13 LAB — CBC WITH DIFFERENTIAL/PLATELET
Abs Immature Granulocytes: 0.21 10*3/uL — ABNORMAL HIGH (ref 0.00–0.07)
Basophils Absolute: 0.1 10*3/uL (ref 0.0–0.1)
Basophils Relative: 1 %
Eosinophils Absolute: 0.1 10*3/uL (ref 0.0–0.5)
Eosinophils Relative: 1 %
HCT: 29.7 % — ABNORMAL LOW (ref 36.0–46.0)
Hemoglobin: 9.5 g/dL — ABNORMAL LOW (ref 12.0–15.0)
Immature Granulocytes: 4 %
Lymphocytes Relative: 17 %
Lymphs Abs: 1 10*3/uL (ref 0.7–4.0)
MCH: 23.8 pg — ABNORMAL LOW (ref 26.0–34.0)
MCHC: 32 g/dL (ref 30.0–36.0)
MCV: 74.4 fL — ABNORMAL LOW (ref 80.0–100.0)
Monocytes Absolute: 0.6 10*3/uL (ref 0.1–1.0)
Monocytes Relative: 11 %
Neutro Abs: 3.9 10*3/uL (ref 1.7–7.7)
Neutrophils Relative %: 66 %
Platelets: 175 10*3/uL (ref 150–400)
RBC: 3.99 MIL/uL (ref 3.87–5.11)
RDW: 14.1 % (ref 11.5–15.5)
WBC: 5.8 10*3/uL (ref 4.0–10.5)
nRBC: 0 % (ref 0.0–0.2)

## 2023-12-13 LAB — COMPREHENSIVE METABOLIC PANEL WITH GFR
ALT: 46 U/L — ABNORMAL HIGH (ref 0–44)
AST: 49 U/L — ABNORMAL HIGH (ref 15–41)
Albumin: 3 g/dL — ABNORMAL LOW (ref 3.5–5.0)
Alkaline Phosphatase: 134 U/L — ABNORMAL HIGH (ref 38–126)
Anion gap: 10 (ref 5–15)
BUN: 13 mg/dL (ref 6–20)
CO2: 23 mmol/L (ref 22–32)
Calcium: 8.1 mg/dL — ABNORMAL LOW (ref 8.9–10.3)
Chloride: 101 mmol/L (ref 98–111)
Creatinine, Ser: 0.78 mg/dL (ref 0.44–1.00)
GFR, Estimated: >60 mL/min (ref >60)
Glucose, Bld: 89 mg/dL (ref 70–99)
Potassium: 3.3 mmol/L — ABNORMAL LOW (ref 3.5–5.1)
Sodium: 134 mmol/L — ABNORMAL LOW (ref 135–145)
Total Bilirubin: 0.8 mg/dL (ref 0.0–1.2)
Total Protein: 6.3 g/dL — ABNORMAL LOW (ref 6.5–8.1)

## 2023-12-13 LAB — MAGNESIUM: Magnesium: 1.9 mg/dL (ref 1.7–2.4)

## 2023-12-13 LAB — VITAMIN B1: Vitamin B1 (Thiamine): 58.2 nmol/L — ABNORMAL LOW (ref 66.5–200.0)

## 2023-12-13 LAB — PARATHYROID HORMONE, INTACT (NO CA): PTH: 308 pg/mL — ABNORMAL HIGH (ref 15–65)

## 2023-12-13 SURGERY — EGD (ESOPHAGOGASTRODUODENOSCOPY)
Anesthesia: Monitor Anesthesia Care

## 2023-12-13 MED ORDER — FLUCONAZOLE IN SODIUM CHLORIDE 400-0.9 MG/200ML-% IV SOLN
400.0000 mg | Freq: Once | INTRAVENOUS | Status: AC
  Administered 2023-12-13: 400 mg via INTRAVENOUS
  Filled 2023-12-13: qty 200

## 2023-12-13 MED ORDER — CALCIUM GLUCONATE-NACL 2-0.675 GM/100ML-% IV SOLN
2.0000 g | Freq: Once | INTRAVENOUS | Status: AC
  Administered 2023-12-13: 2000 mg via INTRAVENOUS
  Filled 2023-12-13 (×2): qty 100

## 2023-12-13 MED ORDER — THIAMINE HCL 100 MG/ML IJ SOLN
100.0000 mg | Freq: Every day | INTRAMUSCULAR | Status: DC
  Administered 2023-12-13 – 2023-12-14 (×2): 100 mg via INTRAVENOUS
  Filled 2023-12-13 (×2): qty 2

## 2023-12-13 MED ORDER — POTASSIUM CHLORIDE CRYS ER 20 MEQ PO TBCR
40.0000 meq | EXTENDED_RELEASE_TABLET | Freq: Once | ORAL | Status: AC
  Administered 2023-12-13: 40 meq via ORAL
  Filled 2023-12-13: qty 2

## 2023-12-13 MED ORDER — FLUCONAZOLE IN SODIUM CHLORIDE 200-0.9 MG/100ML-% IV SOLN
200.0000 mg | INTRAVENOUS | Status: DC
  Administered 2023-12-14: 200 mg via INTRAVENOUS
  Filled 2023-12-13: qty 100

## 2023-12-13 MED ORDER — PROPOFOL 500 MG/50ML IV EMUL
INTRAVENOUS | Status: DC | PRN
  Administered 2023-12-13: 20 mg via INTRAVENOUS
  Administered 2023-12-13: 25 mg via INTRAVENOUS
  Administered 2023-12-13: 125 ug/kg/min via INTRAVENOUS
  Administered 2023-12-13: 20 mg via INTRAVENOUS

## 2023-12-13 MED ORDER — VITAMIN D (ERGOCALCIFEROL) 1.25 MG (50000 UNIT) PO CAPS
50000.0000 [IU] | ORAL_CAPSULE | ORAL | Status: DC
  Administered 2023-12-13: 50000 [IU] via ORAL
  Filled 2023-12-13: qty 1

## 2023-12-13 NOTE — Transfer of Care (Signed)
 Immediate Anesthesia Transfer of Care Note  Patient: Chelsea Hansen  Procedure(s) Performed: EGD (ESOPHAGOGASTRODUODENOSCOPY) BALLOON DILATION BIOPSY, SKIN, SUBCUTANEOUS TISSUE, OR MUCOUS MEMBRANE  Patient Location: Endoscopy Unit  Anesthesia Type:MAC  Level of Consciousness: drowsy and patient cooperative  Airway & Oxygen Therapy: Patient Spontanous Breathing and Patient connected to nasal cannula oxygen  Post-op Assessment: Report given to RN and Post -op Vital signs reviewed and stable  Post vital signs: Reviewed and stable  Last Vitals:  Vitals Value Taken Time  BP 108/81   Temp 36.1 C 12/13/23 10:43  Pulse 85   Resp 14   SpO2 100%     Last Pain:  Vitals:   12/13/23 0857  TempSrc: Temporal  PainSc: 0-No pain         Complications: No notable events documented.

## 2023-12-13 NOTE — Interval H&P Note (Signed)
 History and Physical Interval Note:  12/13/2023 9:15 AM  Chelsea Hansen  has presented today for surgery, with the diagnosis of Nausea, vomiting, abnormal small bowel on PET scan.  The various methods of treatment have been discussed with the patient and family. After consideration of risks, benefits and other options for treatment, the patient has consented to  Procedure(s): EGD (ESOPHAGOGASTRODUODENOSCOPY) (N/A) as a surgical intervention.  The patient's history has been reviewed, patient examined, no change in status, stable for surgery.  I have reviewed the patient's chart and labs.  Questions were answered to the patient's satisfaction.     Arland Usery C Legacie Dillingham

## 2023-12-13 NOTE — Progress Notes (Signed)
 Physical Therapy Treatment Patient Details Name: Chelsea Hansen MRN: [id number] DOB: [birth year] Today's Date: 12/13/2023   History of Present Illness Pt is a 53 year old woman admitted on 6/21 with intractable vomiting and stroke like symptoms related to hypocalcemia, + AKI. Pt recently discharged from [facility] with similar symptoms. 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    PT Comments  Pt tolerated treatment well today. Pt was able to progress ambulation in the hallway and navigate stairs with supervision. No change in DC/DME recs at this time. PT will continue to follow.     If plan is discharge home, recommend the following: Help with stairs or ramp for entrance;Supervision due to cognitive status;A little help with bathing/dressing/bathroom;Assistance with cooking/housework;Direct supervision/assist for medications management;Assist for transportation   Can travel by private vehicle        Equipment Recommendations  Rolling walker (2 wheels)    Recommendations for Other Services       Precautions / Restrictions Precautions Precautions: Fall Recall of Precautions/Restrictions: Intact Restrictions Weight Bearing Restrictions Per Provider Order: No     Mobility  Bed Mobility Overal bed mobility: Modified Independent                  Transfers Overall transfer level: Independent Equipment used: None Transfers: Sit to/from Stand Sit to Stand: Independent                Ambulation/Gait Ambulation/Gait assistance: Supervision, +2 safety/equipment (Lines and leads) Gait Distance (Feet): 250 Feet Assistive device: IV Pole, None Gait Pattern/deviations: Decreased stride length, Step-through pattern Gait velocity: decreased     General Gait Details: Pt able to ambulate down the hall with IV pole and no AD. Chair provided however not needed.   Stairs Stairs: Yes Stairs assistance: Supervision Stair  Management: Two rails, Alternating pattern, Forwards Number of Stairs: 2 General stair comments: no LOB noted.   Wheelchair Mobility     Tilt Bed    Modified Rankin (Stroke Patients Only)       Balance Overall balance assessment: No apparent balance deficits (not formally assessed)                                          Communication Communication Communication: No apparent difficulties  Cognition Arousal: Alert Behavior During Therapy: Flat affect   PT - Cognitive impairments: No apparent impairments                         Following commands: Intact      Cueing Cueing Techniques: Verbal cues  Exercises      General Comments General comments (skin integrity, edema, etc.): VSS      Pertinent Vitals/Pain Pain Assessment Pain Assessment: Faces Faces Pain Scale: Hurts a little bit Pain Location: stomach Pain Descriptors / Indicators: Sore Pain Intervention(s): Monitored during session    Home Living                          Prior Function            PT Goals (current goals can now be found in the care plan section) Progress towards PT goals: Progressing toward goals    Frequency    Min 3X/week      PT Plan  Co-evaluation              AM-PAC PT 6 Clicks Mobility   Outcome Measure  Help needed turning from your back to your side while in a flat bed without using bedrails?: None Help needed moving from lying on your back to sitting on the side of a flat bed without using bedrails?: None Help needed moving to and from a bed to a chair (including a wheelchair)?: A Little Help needed standing up from a chair using your arms (e.g., wheelchair or bedside chair)?: A Little Help needed to walk in hospital room?: A Little Help needed climbing 3-5 steps with a railing? : A Little 6 Click Score: 20    End of Session Equipment Utilized During Treatment: Gait belt Activity Tolerance: Patient tolerated  treatment well Patient left: in chair;with call bell/phone within reach;with family/visitor present Nurse Communication: Mobility status PT Visit Diagnosis: Other abnormalities of gait and mobility (R26.89)     Time: 8473-8460 PT Time Calculation (min) (ACUTE ONLY): 13 min  Charges:    $Gait Training: 8-22 mins PT General Charges $$ ACUTE PT VISIT: 1 Visit                     Sueellen NOVAK, PT, DPT Acute Rehab Services [phone]    Elena Davia 12/13/2023, 4:07 PM

## 2023-12-13 NOTE — Progress Notes (Signed)
 Pharmacy Antibiotic Note  Chelsea Hansen is a 53 y.o. female admitted on 12/10/2023 with intractable nausea vomiting now with concerns for candida esophagitis. Pharmacy has been consulted for fluconazole  dosing.  Plan: Fluconazole  400mg  IV x1, followed by fluconazole  200mg  IV Q24h Trend WBC, fever, renal function F/u cultures, clinical progress Transition to PO when able   Weight: 81.4 kg (179 lb 7.3 oz)  Temp (24hrs), Avg:98.3 F (36.8 C), Min:97 F (36.1 C), Max:99.3 F (37.4 C)  Recent Labs  Lab 12/07/23 0555 12/10/23 2004 12/10/23 2005 12/11/23 0045 12/11/23 0052 12/11/23 0518 12/13/23 0533  WBC 5.1 10.3  --   --   --  9.1 5.8  CREATININE 0.80 5.60* 5.30* 3.84*  --  2.93* 0.78  LATICACIDVEN  --   --   --  1.6 1.6  --   --     Estimated Creatinine Clearance: 93 mL/min (by C-G formula based on SCr of 0.78 mg/dL).    No Known Allergies  Thank you for allowing pharmacy to be a part of this patient's care.  Shelba Collier, PharmD, BCPS Clinical Pharmacist

## 2023-12-13 NOTE — Plan of Care (Signed)

## 2023-12-13 NOTE — Progress Notes (Signed)
 Patient vomited tonight's dose of potassium by mouth despite prn compazine  given prior to administering medication.Dr. Keturah notified. Will continue current plan of care.

## 2023-12-13 NOTE — Progress Notes (Deleted)
 Chelsea Hansen [id number] 11/02/1970   Chief Complaint:  Referring Provider: [name], NP Primary GI MD:   HPI: Chelsea Hansen is a 53 y.o. female with past medical history of *** who presents today for a complaint of *** .       Previous GI Procedures/Imaging      Past Medical History:  Diagnosis Date   Allergy    seasonal allergies   Anxiety    on meds   Asthma    uses inhaler   Breast cancer (HCC) 2012   RIGHT lumpectomy   Cancer (HCC) 2022   RIGHT breast lump-dx 2022   Depression    on meds   DVT (deep venous thrombosis) (HCC) 2010   after hysterectomy   Family history of uterine cancer    GERD (gastroesophageal reflux disease)    with certain foods/OTC PRN meds   Headache(784.0)    History of radiation therapy    Bilateral breast- 09/03/21-11/02/21- Dr. Lynwood Nasuti   Hyperlipidemia    on meds   Hypertension    on meds   SVT (supraventricular tachycardia) (HCC)     Past Surgical History:  Procedure Laterality Date   ABDOMINAL HYSTERECTOMY     AXILLARY SENTINEL NODE BIOPSY Left 07/09/2021   Procedure: LEFT AXILLARY SENTINEL NODE BIOPSY;  Surgeon: [name], MD;  Location: [facility];  Service: General;  Laterality: Left;   BIOPSY  06/29/2022   Procedure: BIOPSY;  Surgeon: Legrand Victory LITTIE DOUGLAS, MD;  Location: [facility];  Service: Gastroenterology;;   BREAST EXCISIONAL BIOPSY Right 09/16/2009   BREAST LUMPECTOMY WITH RADIOACTIVE SEED LOCALIZATION Bilateral 07/09/2021   Procedure: BILATERAL BREAST LUMPECTOMY WITH RADIOACTIVE SEED LOCALIZATION;  Surgeon: [name], MD;  Location: [facility];  Service: General;  Laterality: Bilateral;   BREAST SURGERY     lumpectomy   ESOPHAGOGASTRODUODENOSCOPY (EGD) WITH PROPOFOL  N/A 06/29/2022   Procedure: ESOPHAGOGASTRODUODENOSCOPY (EGD) WITH PROPOFOL ;  Surgeon: Legrand Victory LITTIE DOUGLAS, MD;  Location: [facility];  Service: Gastroenterology;  Laterality: N/A;    PORT-A-CATH REMOVAL Left 07/09/2021   Procedure: REMOVAL PORT-A-CATH;  Surgeon: [name], MD;  Location: [facility];  Service: General;  Laterality: Left;   PORTACATH PLACEMENT Left 01/26/2021   Procedure: INSERTION PORT-A-CATH;  Surgeon: [name], MD;  Location: [facility];  Service: General;  Laterality: Left;   RADIOACTIVE SEED GUIDED AXILLARY SENTINEL LYMPH NODE Right 07/09/2021   Procedure: RADIOACTIVE SEED GUIDED RIGHT AXILLARY SENTINEL LYMPH NODE DISSECTION;  Surgeon: [name], MD;  Location: [facility];  Service: General;  Laterality: Right;   TUBAL LIGATION      No current facility-administered medications for this visit.   No current outpatient medications on file.   Facility-Administered Medications Ordered in Other Visits  Medication Dose Route Frequency Provider Last Rate Last Admin   acetaminophen  (TYLENOL ) tablet 650 mg  650 mg Oral Once PRN Sundil, Subrina, MD       anastrozole  (ARIMIDEX ) tablet 1 mg  1 mg Oral Daily Sundil, Subrina, MD   1 mg at 12/13/23 1406   calcitRIOL  (ROCALTROL ) capsule 0.5 mcg  0.5 mcg Oral Daily Singh, Prashant K, MD   0.5 mcg at 12/13/23 1406   calcium  gluconate 2 g/ 100 mL sodium chloride  IVPB  2 g Intravenous Once Singh, Prashant K, MD       [START ON 12/14/2023] fluconazole  (DIFLUCAN ) IVPB 200 mg  200 mg Intravenous Q24H Singh, Prashant K, MD  fluconazole  (DIFLUCAN ) IVPB 400 mg  400 mg Intravenous Once Dennise Lavada POUR, MD       LORazepam  (ATIVAN ) tablet 0.5 mg  0.5 mg Oral Q4H PRN Mansy, Jan A, MD   0.5 mg at 12/12/23 0413   methylnaltrexone  (RELISTOR ) injection 6 mg  6 mg Subcutaneous Q48H Esterwood, Amy S, PA-C   6 mg at 12/11/23 1715   metoprolol  tartrate (LOPRESSOR ) injection 2.5 mg  2.5 mg Intravenous Q5 min PRN Lee, Subrina, MD       oxyCODONE -acetaminophen  (PERCOCET) 7.5-325 MG per tablet 1-2 tablet  1-2 tablet Oral Q6H PRN Sundil, Subrina, MD       pantoprazole  (PROTONIX ) injection  40 mg  40 mg Intravenous Q12H Odell Celinda Balo, MD   40 mg at 12/13/23 1407   polyethylene glycol (MIRALAX  / GLYCOLAX ) packet 17 g  17 g Oral Daily Sundil, Subrina, MD       prochlorperazine  (COMPAZINE ) injection 10 mg  10 mg Intravenous Q4H PRN Odell Celinda Balo, MD   10 mg at 12/12/23 2027   senna-docusate (Senokot-S) tablet 1 tablet  1 tablet Oral BID Sundil, Subrina, MD   1 tablet at 12/13/23 1408   thiamine  (VITAMIN B1) injection 100 mg  100 mg Intravenous Daily Singh, Prashant K, MD   100 mg at 12/13/23 1409   trimethobenzamide  (TIGAN ) injection 200 mg  200 mg Intramuscular Q8H PRN Sundil, Subrina, MD   200 mg at 12/11/23 9862   Vitamin D  (Ergocalciferol ) (DRISDOL ) 1.25 MG (50000 UNIT) capsule 50,000 Units  50,000 Units Oral Q7 days Singh, Prashant K, MD        Allergies as of 12/14/2023   (No Known Allergies)    Family History  Problem Relation Age of Onset   Breast cancer Mother        52s   Hypertension Father    Uterine cancer Maternal Aunt    Ovarian cancer Maternal Aunt    Breast cancer Maternal Aunt        6s   Breast cancer Cousin 78   Uterine cancer Cousin 50   Colon polyps Neg Hx    Colon cancer Neg Hx    Esophageal cancer Neg Hx    Stomach cancer Neg Hx     Social History   Tobacco Use   Smoking status: Never   Smokeless tobacco: Never  Vaping Use   Vaping status: Never Used  Substance Use Topics   Alcohol use: Not Currently    Alcohol/week: 7.0 standard drinks of alcohol    Types: 7 Standard drinks or equivalent per week    Comment: occassional   Drug use: No     Review of Systems:    Constitutional: No weight loss, fever, chills, weakness or fatigue Eyes: No change in vision Ears, Nose, Throat:  No change in hearing or congestion Skin: No rash or itching Cardiovascular: No chest pain, chest pressure or palpitations   Respiratory: No SOB or cough Gastrointestinal: See HPI and otherwise negative Genitourinary: No dysuria or change in  urinary frequency Neurological: No headache, dizziness or syncope Musculoskeletal: No new muscle or joint pain Hematologic: No bleeding or bruising    Physical Exam:  Vital signs: There were no vitals taken for this visit.  Constitutional: NAD, Well developed, Well nourished, alert and cooperative Head:  Normocephalic and atraumatic.  Eyes: No scleral icterus. Conjunctiva pink. Mouth: No oral lesions. Respiratory: Respirations even and unlabored. Lungs clear to auscultation bilaterally.  No wheezes, crackles, or rhonchi.  Cardiovascular:  Regular rate and rhythm. No murmurs. No peripheral edema. Gastrointestinal:  Soft, nondistended, nontender. No rebound or guarding. Normal bowel sounds. No appreciable masses or hepatomegaly. Rectal:  Not performed.  Neurologic:  Alert and oriented x4;  grossly normal neurologically.  Skin:   Dry and intact without significant lesions or rashes. Psychiatric: Oriented to person, place and time. Demonstrates good judgement and reason without abnormal affect or behaviors.   RELEVANT LABS AND IMAGING: CBC    Component Value Date/Time   WBC 5.8 12/13/2023 0533   RBC 3.99 12/13/2023 0533   HGB 9.5 (L) 12/13/2023 0533   HGB 11.6 (L) 12/01/2023 1307   HGB 11.5 10/06/2020 1432   HCT 29.7 (L) 12/13/2023 0533   HCT 36.8 10/06/2020 1432   PLT 175 12/13/2023 0533   PLT 289 12/01/2023 1307   PLT 323 10/06/2020 1432   MCV 74.4 (L) 12/13/2023 0533   MCV 73 (L) 10/06/2020 1432   MCH 23.8 (L) 12/13/2023 0533   MCHC 32.0 12/13/2023 0533   RDW 14.1 12/13/2023 0533   RDW 15.9 (H) 10/06/2020 1432   LYMPHSABS 1.0 12/13/2023 0533   LYMPHSABS 1.9 10/06/2020 1432   MONOABS 0.6 12/13/2023 0533   EOSABS 0.1 12/13/2023 0533   EOSABS 0.1 10/06/2020 1432   BASOSABS 0.1 12/13/2023 0533   BASOSABS 0.0 10/06/2020 1432    CMP     Component Value Date/Time   NA 134 (L) 12/13/2023 0533   NA 144 11/30/2023 1000   K 3.3 (L) 12/13/2023 0533   CL 101 12/13/2023  0533   CO2 23 12/13/2023 0533   GLUCOSE 89 12/13/2023 0533   BUN 13 12/13/2023 0533   BUN 19 11/30/2023 1000   CREATININE 0.78 12/13/2023 0533   CREATININE 1.07 (H) 12/01/2023 1307   CALCIUM  8.1 (L) 12/13/2023 0533   PROT 6.3 (L) 12/13/2023 0533   PROT 7.3 11/30/2023 1000   ALBUMIN  3.0 (L) 12/13/2023 0533   ALBUMIN  4.5 11/30/2023 1000   AST 49 (H) 12/13/2023 0533   AST 43 (H) 12/01/2023 1307   ALT 46 (H) 12/13/2023 0533   ALT 53 (H) 12/01/2023 1307   ALKPHOS 134 (H) 12/13/2023 0533   BILITOT 0.8 12/13/2023 0533   BILITOT 0.6 12/01/2023 1307   GFRNONAA >60 12/13/2023 0533   GFRNONAA >60 12/01/2023 1307   GFRAA >60 09/07/2019 1340     Assessment/Plan:       Camie Furbish, PA-C [facility] 12/13/2023, 2:17 PM  Patient Care Team: [name], NP as PCP - General (Internal Medicine) [name], [name], DO as PCP - Cardiology (Cardiology) [name], MD as Consulting Physician (Radiation Oncology) [name], MD as Consulting Physician (General Surgery) [name], MD as Consulting Physician (Hematology and Oncology) [name], [name], NP as Nurse Practitioner (Nurse Practitioner)

## 2023-12-13 NOTE — Progress Notes (Signed)
 PT Cancellation Note  Patient Details Name: Chelsea Hansen MRN: [id number] DOB: 1971/04/23   Cancelled Treatment:    Reason Eval/Treat Not Completed: Patient at procedure or test/unavailable (Pt off of the floor at EGD. Will follow up if time allows.)   Ajia Chadderdon 12/13/2023, 9:50 AM

## 2023-12-13 NOTE — Op Note (Addendum)
 [facility] Patient Name: Chelsea Hansen Procedure Date : 12/13/2023 MRN: [id number] Attending MD: Rosario Estefana Kidney , , [id number] Date of Birth: 11-18-1970 CSN: [id number] Age: 53 Admit Type: Inpatient Procedure:                Upper GI endoscopy Indications:              Abnormal CT of the GI tract, Nausea with vomiting Providers:                Rosario Estefana Kidney Gregoria Odean, RN,                            Haskel Chris, Technician Referring MD:             Hospitalist team Medicines:                Monitored Anesthesia Care Complications:            No immediate complications. Estimated Blood Loss:     Estimated blood loss was minimal. Procedure:                Pre-Anesthesia Assessment:                           - Prior to the procedure, a History and Physical                            was performed, and patient medications and                            allergies were reviewed. The patient's tolerance of                            previous anesthesia was also reviewed. The risks                            and benefits of the procedure and the sedation                            options and risks were discussed with the patient.                            All questions were answered, and informed consent                            was obtained. Prior Anticoagulants: The patient has                            taken no anticoagulant or antiplatelet agents. ASA                            Grade Assessment: III - A patient with severe                            systemic disease. After reviewing the risks and  benefits, the patient was deemed in satisfactory                            condition to undergo the procedure.                           After obtaining informed consent, the endoscope was                            passed under direct vision. Throughout the                            procedure, the patient's blood pressure,  pulse, and                            oxygen saturations were monitored continuously. The                            [id number]-[id number] ([id number]) Olympus endoscope was introduced                            through the mouth, and advanced to the duodenal                            bulb. The [id number]-[id number] ([id number]) Olympus slim                            endoscope was introduced through the mouth, and                            advanced to the second part of duodenum. The upper                            GI endoscopy was accomplished without difficulty.                            The patient tolerated the procedure well. Scope In: Scope Out: Findings:      LA Grade C (one or more mucosal breaks continuous between tops of 2 or       more mucosal folds, less than 75% circumference) esophagitis with no       bleeding was found in the mid and distal esophagus. There were some       yellow/white plaques located in the esophagus that are suspicious for       candida. Biopsies were taken with a cold forceps for histology.      One benign-appearing, intrinsic moderate (circumferential scarring or       stenosis; an endoscope may pass) stenosis was found in the distal       esophagus. This stenosis measured less than one cm (in length). The       stenosis was traversed.      A 3 cm hiatal hernia was present.      Retained fluid was found in the gastric body.      Localized inflammation characterized by congestion (edema), erythema and       granularity  was found in the gastric body. Biopsies were taken with a       cold forceps for Helicobacter pylori testing.      Localized inflammation characterized by congestion (edema) and erythema       was found in the duodenal bulb.      An acquired stenosis was found in the duodenal sweep and second portion       of the duodenum and was traversed after switching to the neonatal       endoscope. A TTS dilator was passed through the scope. Dilation with a       04-01-11  mm pyloric balloon dilator (up to a maximum size of 12 mm) was       performed. Biopsies were taken with a cold forceps for histology. Impression:               - LA Grade C esophagitis with no bleeding. Biopsied.                           - Benign-appearing esophageal stenosis.                           - 3 cm hiatal hernia.                           - Retained gastric fluid.                           - Gastritis. Biopsied.                           - Duodenitis.                           - Acquired duodenal stenosis. Dilated. Biopsied. Recommendation:           - Return patient to hospital ward for ongoing care.                           - It is suspected that the patient's duodenal                            stenosis/duodenitis as well as candida esophagitis                            is contributing to her N&V.                           - Await pathology results.                           - Continue PPI BID.                           - Will start treatment with fluconazole  for                            presumed candida. Pharmacy consult ordered to help  with dosing this medication.                           - Avoid NSAID use.                           - Full liquid diet for now. Advance as tolerated.                           - If patient continues to have N&V, could consider                            further dilation of the duodenal stenosis.                           - Repeat EGD in 2-3 months to assess for healing of                            esophagitis.                           - The findings and recommendations were discussed                            with the patient. Procedure Code(s):        --- Professional ---                           [phone], Esophagogastroduodenoscopy, flexible,                            transoral; with dilation of gastric/duodenal                            stricture(s) (eg, balloon, bougie)                            43239, 59, Esophagogastroduodenoscopy, flexible,                            transoral; with biopsy, single or multiple Diagnosis Code(s):        --- Professional ---                           K20.90, Esophagitis, unspecified without bleeding                           K22.2, Esophageal obstruction                           K44.9, Diaphragmatic hernia without obstruction or                            gangrene                           K29.70, Gastritis, unspecified, without bleeding  K29.80, Duodenitis without bleeding                           K31.5, Obstruction of duodenum                           R11.2, Nausea with vomiting, unspecified                           R93.3, Abnormal findings on diagnostic imaging of                            other parts of digestive tract CPT copyright 2022 [employer]. All rights reserved. The codes documented in this report are preliminary and upon coder review may  be revised to meet current compliance requirements. Dr Estefana Federico Rosario Estefana Federico,  12/13/2023 10:55:38 AM Number of Addenda: 0

## 2023-12-13 NOTE — Progress Notes (Signed)
 PROGRESS NOTE                                                                                                                                                                                                             Patient Demographics:    Chelsea Hansen, is a 53 y.o. female, DOB - 1970-10-03, FMW:[id number]  Outpatient Primary MD for the patient is [name], NP    LOS - 3  Admit date - 12/10/2023    Chief Complaint  Patient presents with   Code Stroke       Brief Narrative (HPI from H&P)   53 y.o. female with medical history significant of CKD stage IIIa, history of recurrent hospital admission for intractable nausea vomiting with associated electrolyte derangement, supraventricular tachycardia, asthma, essential hypertension, hyperlipidemia, chronic pulmonary nodule, cancer related pain and metastatic breast cancer status post neoadjuvant chemotherapy, lumpectomy, SNL biopsy status post radiation currently on anastrozole  and abemaciclib , recent issues with recurrent nausea vomiting requiring admissions.   Presented to emergency department accompanied by EMS with concern for stroke.  Patient son reported last known well 11 AM 6/21.  EMS reported aphasia worsening throughout transport and left-sided facial droop/asymmetry.  She also has continued nausea vomiting at home, further workup in the ER rule out stroke but confirmed severe hypocalcemia likely causing tetany and facial asymmetry from muscle spasm.  She was seen by neurology, GI and admitted to the hospital.   Subjective:   Patient in bed, appears comfortable, denies any headache, no fever, no chest pain or pressure, no shortness of breath , no abdominal pain. No new focal weakness.   Assessment  & Plan :    Facial asymmetry caused by tetany due to severe hypocalcemia - she has been having issues with recurrent nausea vomiting requiring multiple hospital  admissions, she did have nausea vomiting prior to this admission as well likely causing dehydration and hypocalcemia, aggressive calcium  replacement, head CT unremarkable seen by neurology, stroke ruled out.  Currently neurologically back to baseline.  Continue electrolyte replacement  Recurrent nausea vomiting with severe dehydration, AKI, hypokalemia, hypocalcemia, hypomagnesemia.  GI on board, CT scan abdomen pelvis noted with possible mucosal lesion in the hepatic flexure, due for EGD on 12/13/2023, clear liquids and supportive care for now, replace electrolytes including calcium , magnesium  and potassium and continue IV fluids for  hydration.  History of metastatic breast cancer.  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  Case discussed with oncologist Dr. Godino who request chest CT noncontrast for staging has been obtained suggestive of multiple nodules/malignancy cannot be ruled out, Dr. Valere wants to make sure that patient's home medication Verzinio has been stopped permanently upon discharge.  Chronic cancer patient with narcotic bowel.  Supportive care, bowel regimen continued.  Hx of hypertension, SVT.  Pressure is improved low-dose beta-blocker.        Condition -  Guarded  Family Communication  :  None present  Code Status :  Full  Consults  :  GI, Onc, neurology  PUD Prophylaxis :  PPI   Procedures  :     CT chest noncontrast.  1. No acute cardiopulmonary abnormalities. No significant change from 11/23/2023. 2. No chest wall mass or focal fluid collections identified. 3. Diffuse sclerotic bone metastases are identified within the bony thorax. These are similar in distribution and size compared with the previous examination from 11/23/2023. 4. Multiple tiny lung nodules within upper lung  zone predominance are similar in size and multiplicity when compared with 11/23/2023. These nodules are nonspecific and may be infectious or inflammatory in etiology. Metastatic disease is not excluded. 5. Post treatment changes identified within the right breast with central surgical clips and overlying skin thickening  CT abdomen pelvis- 1. Diffuse osteoblastic changes consistent with metastatic disease. 2. Possible colonic mucosal lesion in the hepatic flexure. This can be better evaluated endoscopically. 3. Diverticulosis      Disposition Plan  :    Status is: Inpatient  DVT Prophylaxis  :    Place and maintain sequential compression device Start: 12/11/23 0708 SCDs Start: 12/10/23 2306 Place TED hose Start: 12/10/23 2306    Lab Results  Component Value Date   PLT 175 12/13/2023    Diet :  Diet Order             Diet NPO time specified Except for: Sips with Meds  Diet effective midnight                    Inpatient Medications  Scheduled Meds:  [MAR Hold] anastrozole   1 mg Oral Daily   [MAR Hold] calcitRIOL  0.5 mcg Oral Daily   [MAR Hold] cholecalciferol   1,000 Units Oral Daily   [MAR Hold] methylnaltrexone  6 mg Subcutaneous Q48H   [MAR Hold] pantoprazole  (PROTONIX ) IV  40 mg Intravenous Q12H   [MAR Hold] polyethylene glycol  17 g Oral Daily   potassium chloride   40 mEq Oral Once   [MAR Hold] senna-docusate  1 tablet Oral BID   [MAR Hold] thiamine (VITAMIN B1) injection  100 mg Intravenous Daily   Continuous Infusions:  sodium chloride  20 mL/hr at 12/13/23 0353   calcium  gluconate     lactated ringers  1,000 mL with potassium chloride  20 mEq infusion 75 mL/hr at 12/13/23 0246   PRN Meds:.[MAR Hold] acetaminophen , [MAR Hold] LORazepam , [MAR Hold] metoprolol  tartrate, [MAR Hold] oxyCODONE -acetaminophen , [MAR Hold] prochlorperazine , [MAR Hold] trimethobenzamide    Objective:   Vitals:   12/12/23 2023 12/12/23 2344 12/13/23 0416 12/13/23 0749  BP: 127/80  125/75 114/73 112/61  Pulse:    63  Resp: 20 16 (!) 23 14  Temp: 99.3 F (37.4 C) 99.2 F (37.3 C) 98.4 F (36.9  C) 98.3 F (36.8 C)  TempSrc: Oral Oral Oral Oral  SpO2:  97% 97% 96%  Weight:        Wt Readings from Last 3 Encounters:  12/10/23 81.4 kg  12/05/23 85.5 kg  12/01/23 85.5 kg     Intake/Output Summary (Last 24 hours) at 12/13/2023 0851 Last data filed at 12/13/2023 0353 Gross per 24 hour  Intake 196.27 ml  Output --  Net 196.27 ml     Physical Exam  Awake Alert, No new F.N deficits, Normal affect [place].AT,PERRAL Supple Neck, No JVD,   Symmetrical Chest wall movement, Good air movement bilaterally, CTAB RRR,No Gallops,Rubs or new Murmurs,  +ve B.Sounds, Abd Soft, No tenderness,   No Cyanosis, Clubbing or edema        Data Review:    Recent Labs  Lab 12/07/23 0555 12/10/23 2004 12/10/23 2005 12/11/23 0518 12/13/23 0533  WBC 5.1 10.3  --  9.1 5.8  HGB 11.3* 13.6 16.0* 10.7* 9.5*  HCT 35.5* 43.0 47.0* 34.1* 29.7*  PLT 209 324  --  223 175  MCV 75.1* 75.2*  --  75.1* 74.4*  MCH 23.9* 23.8*  --  23.6* 23.8*  MCHC 31.8 31.6  --  31.4 32.0  RDW 14.2 14.2  --  14.2 14.1  LYMPHSABS 1.0 2.7  --   --  1.0  MONOABS 0.6 0.7  --   --  0.6  EOSABS 0.1 0.0  --   --  0.1  BASOSABS 0.1 0.0  --   --  0.1    Recent Labs  Lab 12/07/23 0555 12/10/23 2004 12/10/23 2005 12/10/23 2048 12/11/23 0045 12/11/23 0052 12/11/23 0518 12/11/23 0818 12/13/23 0533  NA 132* 137 132*  --  135  --  135  --  134*  K 3.2* 3.5 3.3*  --  3.0*  --  3.0*  --  3.3*  CL 92* 73* 76*  --  81*  --  86*  --  101  CO2 27 27  --   --  39*  --  35*  --  23  ANIONGAP 13 37*  --   --  15  --  14  --  10  GLUCOSE 94 238* 231*  --  106*  --  100*  --  89  BUN 20 50* 49*  --  49*  --  44*  --  13  CREATININE 0.80 5.60* 5.30*  --  3.84*  --  2.93*  --  0.78  AST 40 98*  --   --   --   --  80*  --  49*  ALT 59* 96*  --   --   --   --  79*  --  46*  ALKPHOS 166* 164*  --   --   --   --   135*  --  134*  BILITOT 1.0 1.0  --   --   --   --  1.0  --  0.8  ALBUMIN 3.7 4.4  --   --   --   --  3.5  --  3.0*  LATICACIDVEN  --   --   --   --  1.6 1.6  --   --   --   INR  --  1.1  --   --   --   --   --   --   --   MG 2.5*  --   --  2.3  --   --  2.4 2.1 1.9  PHOS 3.2  --   --  4.8*  --   --   --   --   --   CALCIUM  8.1* 6.7*  --   --  6.4*  --  6.6*  --  8.1*      Recent Labs  Lab 12/07/23 0555 12/10/23 2004 12/10/23 2048 12/11/23 0045 12/11/23 0052 12/11/23 0518 12/11/23 0818 12/13/23 0533  LATICACIDVEN  --   --   --  1.6 1.6  --   --   --   INR  --  1.1  --   --   --   --   --   --   MG 2.5*  --  2.3  --   --  2.4 2.1 1.9  CALCIUM  8.1* 6.7*  --  6.4*  --  6.6*  --  8.1*    --------------------------------------------------------------------------------------------------------------- Lab Results  Component Value Date   CHOL 226 (H) 10/06/2020   HDL 51 10/06/2020   LDLCALC 140 (H) 10/06/2020   TRIG 196 (H) 10/06/2020   CHOLHDL 4.4 10/06/2020    No results found for: HGBA1C No results for input(s): TSH, T4TOTAL, FREET4, T3FREE, THYROIDAB in the last 72 hours. No results for input(s): VITAMINB12, FOLATE, FERRITIN, TIBC, IRON, RETICCTPCT in the last 72 hours. ------------------------------------------------------------------------------------------------------------------ Cardiac Enzymes No results for input(s): CKMB, TROPONINI, MYOGLOBIN in the last 168 hours.  Invalid input(s): CK  Micro Results Recent Results (from the past 240 hours)  Blood Culture (routine x 2)     Status: None (Preliminary result)   Collection Time: 12/10/23  8:22 PM   Specimen: BLOOD RIGHT HAND  Result Value Ref Range Status   Specimen Description BLOOD RIGHT HAND  Final   Special Requests   Final    BOTTLES DRAWN AEROBIC AND ANAEROBIC Blood Culture adequate volume   Culture   Final    NO GROWTH 3 DAYS Performed at [facility], [address] [place], [place] [address]    Report Status PENDING  Incomplete  Resp panel by RT-PCR (RSV, Flu A&B, Covid) Anterior Nasal Swab     Status: None   Collection Time: 12/10/23  8:48 PM   Specimen: Anterior Nasal Swab  Result Value Ref Range Status   SARS Coronavirus 2 by RT PCR NEGATIVE NEGATIVE Final   Influenza A by PCR NEGATIVE NEGATIVE Final   Influenza B by PCR NEGATIVE NEGATIVE Final    Comment: (NOTE) The Xpert Xpress SARS-CoV-2/FLU/RSV plus assay is intended as an aid in the diagnosis of influenza from Nasopharyngeal swab specimens and should not be used as a sole basis for treatment. Nasal washings and aspirates are unacceptable for Xpert Xpress SARS-CoV-2/FLU/RSV testing.  Fact Sheet for Patients: BloggerCourse.com  Fact Sheet for Healthcare Providers: SeriousBroker.it  This test is not yet approved or cleared by the [place]  FDA and has been authorized for detection and/or diagnosis of SARS-CoV-2 by FDA under an Emergency Use Authorization (EUA). This EUA will remain in effect (meaning this test can be used) for the duration of the COVID-19 declaration under Section 564(b)(1) of the Act, 21 U.S.C. section 360bbb-3(b)(1), unless the authorization is terminated or revoked.     Resp Syncytial Virus by PCR NEGATIVE NEGATIVE Final    Comment: (NOTE) Fact Sheet for Patients: BloggerCourse.com  Fact Sheet for Healthcare Providers: SeriousBroker.it  This test is not yet approved or cleared by the [place]  FDA and has been authorized for detection and/or diagnosis of SARS-CoV-2 by  FDA under an Emergency Use Authorization (EUA). This EUA will remain in effect (meaning this test can be used) for the duration of the COVID-19 declaration under Section 564(b)(1) of the Act, 21 U.S.C. section 360bbb-3(b)(1), unless the authorization is terminated  or revoked.  Performed at [facility], [address], [place], [place] [address]   Blood Culture (routine x 2)     Status: None (Preliminary result)   Collection Time: 12/10/23  8:48 PM   Specimen: BLOOD  Result Value Ref Range Status   Specimen Description BLOOD RIGHT ANTECUBITAL  Final   Special Requests   Final    BOTTLES DRAWN AEROBIC AND ANAEROBIC Blood Culture adequate volume   Culture   Final    NO GROWTH 3 DAYS Performed at [facility], [address] [place], [place] [address]    Report Status PENDING  Incomplete    Radiology Report CT CHEST WO CONTRAST Result Date: 12/13/2023 CLINICAL DATA:  Chest wall pain nontraumatic. Inflammation or infection suspected. Metastatic breast cancer. * Tracking Code: BO * EXAM: CT CHEST WITHOUT CONTRAST TECHNIQUE: Multidetector CT imaging of the chest was performed following the standard protocol without IV contrast. RADIATION DOSE REDUCTION: This exam was performed according to the departmental dose-optimization program which includes automated exposure control, adjustment of the mA and/or kV according to patient size and/or use of iterative reconstruction technique. COMPARISON:  PET-CT 11/24/2023 FINDINGS: Cardiovascular: Heart size appears normal.  No pericardial effusion. Mediastinum/Nodes: No axillary, supraclavicular, mediastinal or hilar adenopathy. Thyroid  gland, trachea and esophagus demonstrate no significant findings. Lungs/Pleura: No pleural effusion. No pneumothorax, airspace consolidation or atelectasis. As noted previously there are multiple tiny lung nodules within upper lung zone predominance which have a nonspecific appearance These nodules are similar in size and multiplicity when compared with 11/23/2023. Reference nodule in the anterior right upper lobe measures 2 mm, image 46/4. Unchanged from previous exam. Reference nodule within the anteromedial right middle lobe measures 4 mm, image 76/4. Also unchanged. Upper  Abdomen: No acute abnormality. Exophytic cyst off the upper pole of right kidney is unchanged measuring 9 mm. No follow-up imaging recommended. Musculoskeletal: Diffuse sclerotic bone metastases are identified within the bony thorax. These are similar in distribution and size compared with the previous examination from 11/23/2023. No chest wall mass. Post treatment changes identified within the right breast with central surgical clips and overlying skin thickening. No chest wall mass or focal fluid collections identified. IMPRESSION: 1. No acute cardiopulmonary abnormalities. No significant change from 11/23/2023. 2. No chest wall mass or focal fluid collections identified. 3. Diffuse sclerotic bone metastases are identified within the bony thorax. These are similar in distribution and size compared with the previous examination from 11/23/2023. 4. Multiple tiny lung nodules within upper lung zone predominance are similar in size and multiplicity when compared with 11/23/2023. These nodules are nonspecific and may be infectious or inflammatory in etiology. Metastatic disease is not excluded. 5. Post treatment changes identified within the right breast with central surgical clips and overlying skin thickening. Electronically Signed   By: Waddell Calk M.D.   On: 12/13/2023 04:56   DG Abd Portable 1V Result Date: 12/12/2023 EXAM: 1 VIEW XRAY OF THE ABDOMEN SUPINE 12/12/2023 06:03:00 AM COMPARISON: 1 view abdomen 01/04/2024. CT of the abdomen and pelvis 12/10/2023 and 12/06/2023. CLINICAL HISTORY: Nausea and vomiting. FINDINGS: BOWEL: Nonobstructive bowel gas pattern. PERITONEUM AND SOFT TISSUES: No abnormal calcifications. BONES: No acute osseous abnormality. IMPRESSION: 1. No acute findings. Electronically signed by:  Lonni Necessary MD 12/12/2023 06:21 AM EDT RP Workstation: [id number]     Signature  -   Lavada Stank M.D on 12/13/2023 at 8:51 AM   -  To page go to [email or web address]

## 2023-12-14 ENCOUNTER — Ambulatory Visit: Payer: MEDICAID | Admitting: Gastroenterology

## 2023-12-14 ENCOUNTER — Ambulatory Visit: Payer: Self-pay | Admitting: Internal Medicine

## 2023-12-14 ENCOUNTER — Other Ambulatory Visit (HOSPITAL_COMMUNITY): Payer: Self-pay

## 2023-12-14 ENCOUNTER — Encounter: Payer: Self-pay | Admitting: Hematology and Oncology

## 2023-12-14 DIAGNOSIS — R933 Abnormal findings on diagnostic imaging of other parts of digestive tract: Secondary | ICD-10-CM

## 2023-12-14 DIAGNOSIS — K209 Esophagitis, unspecified without bleeding: Secondary | ICD-10-CM

## 2023-12-14 DIAGNOSIS — K315 Obstruction of duodenum: Secondary | ICD-10-CM

## 2023-12-14 DIAGNOSIS — N189 Chronic kidney disease, unspecified: Secondary | ICD-10-CM | POA: Diagnosis not present

## 2023-12-14 DIAGNOSIS — B3781 Candidal esophagitis: Secondary | ICD-10-CM

## 2023-12-14 DIAGNOSIS — K2210 Ulcer of esophagus without bleeding: Secondary | ICD-10-CM | POA: Diagnosis not present

## 2023-12-14 DIAGNOSIS — N179 Acute kidney failure, unspecified: Secondary | ICD-10-CM | POA: Diagnosis not present

## 2023-12-14 DIAGNOSIS — K5900 Constipation, unspecified: Secondary | ICD-10-CM | POA: Diagnosis not present

## 2023-12-14 LAB — CBC WITH DIFFERENTIAL/PLATELET
Abs Immature Granulocytes: 0.22 10*3/uL — ABNORMAL HIGH (ref 0.00–0.07)
Basophils Absolute: 0 10*3/uL (ref 0.0–0.1)
Basophils Relative: 1 %
Eosinophils Absolute: 0.1 10*3/uL (ref 0.0–0.5)
Eosinophils Relative: 1 %
HCT: 32.6 % — ABNORMAL LOW (ref 36.0–46.0)
Hemoglobin: 10.3 g/dL — ABNORMAL LOW (ref 12.0–15.0)
Immature Granulocytes: 4 %
Lymphocytes Relative: 17 %
Lymphs Abs: 1 10*3/uL (ref 0.7–4.0)
MCH: 23.7 pg — ABNORMAL LOW (ref 26.0–34.0)
MCHC: 31.6 g/dL (ref 30.0–36.0)
MCV: 75.1 fL — ABNORMAL LOW (ref 80.0–100.0)
Monocytes Absolute: 0.5 10*3/uL (ref 0.1–1.0)
Monocytes Relative: 8 %
Neutro Abs: 4 10*3/uL (ref 1.7–7.7)
Neutrophils Relative %: 69 %
Platelets: 194 10*3/uL (ref 150–400)
RBC: 4.34 MIL/uL (ref 3.87–5.11)
RDW: 14.3 % (ref 11.5–15.5)
WBC: 5.7 10*3/uL (ref 4.0–10.5)
nRBC: 0 % (ref 0.0–0.2)

## 2023-12-14 LAB — COMPREHENSIVE METABOLIC PANEL WITH GFR
ALT: 54 U/L — ABNORMAL HIGH (ref 0–44)
AST: 53 U/L — ABNORMAL HIGH (ref 15–41)
Albumin: 3.2 g/dL — ABNORMAL LOW (ref 3.5–5.0)
Alkaline Phosphatase: 137 U/L — ABNORMAL HIGH (ref 38–126)
Anion gap: 8 (ref 5–15)
BUN: 11 mg/dL (ref 6–20)
CO2: 24 mmol/L (ref 22–32)
Calcium: 8 mg/dL — ABNORMAL LOW (ref 8.9–10.3)
Chloride: 103 mmol/L (ref 98–111)
Creatinine, Ser: 0.91 mg/dL (ref 0.44–1.00)
GFR, Estimated: >60 mL/min (ref >60)
Glucose, Bld: 76 mg/dL (ref 70–99)
Potassium: 3.3 mmol/L — ABNORMAL LOW (ref 3.5–5.1)
Sodium: 135 mmol/L (ref 135–145)
Total Bilirubin: 0.7 mg/dL (ref 0.0–1.2)
Total Protein: 6.8 g/dL (ref 6.5–8.1)

## 2023-12-14 LAB — MAGNESIUM: Magnesium: 1.7 mg/dL (ref 1.7–2.4)

## 2023-12-14 MED ORDER — POTASSIUM CHLORIDE CRYS ER 20 MEQ PO TBCR
40.0000 meq | EXTENDED_RELEASE_TABLET | Freq: Four times a day (QID) | ORAL | Status: DC
  Administered 2023-12-14: 40 meq via ORAL
  Filled 2023-12-14: qty 2

## 2023-12-14 MED ORDER — CALCIUM CARB-CHOLECALCIFEROL 600-10 MG-MCG PO TABS
1.0000 | ORAL_TABLET | Freq: Three times a day (TID) | ORAL | 0 refills | Status: DC
  Filled 2023-12-14 (×2): qty 90, 30d supply, fill #0

## 2023-12-14 MED ORDER — PANTOPRAZOLE SODIUM 40 MG PO TBEC
40.0000 mg | DELAYED_RELEASE_TABLET | Freq: Two times a day (BID) | ORAL | 0 refills | Status: DC
  Filled 2023-12-14: qty 60, 30d supply, fill #0

## 2023-12-14 MED ORDER — CALCITRIOL 0.5 MCG PO CAPS
0.5000 ug | ORAL_CAPSULE | Freq: Every day | ORAL | 0 refills | Status: DC
  Filled 2023-12-14: qty 30, 30d supply, fill #0

## 2023-12-14 MED ORDER — FLUCONAZOLE 100 MG PO TABS
200.0000 mg | ORAL_TABLET | Freq: Every day | ORAL | 0 refills | Status: AC
  Filled 2023-12-14: qty 24, 12d supply, fill #0

## 2023-12-14 NOTE — Plan of Care (Signed)

## 2023-12-14 NOTE — Anesthesia Postprocedure Evaluation (Signed)
 Anesthesia Post Note  Patient: Chelsea Hansen  Procedure(s) Performed: EGD (ESOPHAGOGASTRODUODENOSCOPY) BALLOON DILATION BIOPSY, SKIN, SUBCUTANEOUS TISSUE, OR MUCOUS MEMBRANE     Patient location during evaluation: Endoscopy Anesthesia Type: MAC Level of consciousness: awake and alert Pain management: pain level controlled Vital Signs Assessment: post-procedure vital signs reviewed and stable Respiratory status: spontaneous breathing Cardiovascular status: stable Anesthetic complications: no   No notable events documented.  Last Vitals:  Vitals:   12/14/23 0628 12/14/23 0811  BP: 111/74 116/74  Pulse:  70  Resp: 16   Temp: 36.9 C 36.8 C  SpO2:  98%    Last Pain:  Vitals:   12/14/23 0811  TempSrc: Oral  PainSc:                  Norleen Pope

## 2023-12-14 NOTE — Progress Notes (Addendum)
 Daily Progress Note  DOA: 12/10/2023 Hospital Day: 5   Cc: N&V    ASSESSMENT    53 yo female with metastatic breast cancer admitted with persistent nausea / vomiting with associated electrolyte abnormalities. Recent PET scan showing distended stomach abnormal duodenum (without abnormal uptake). 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  Improved after dilation of the duodenal narrowing, which seems to be caused by inflammation. Biopsies showed duodenal edema, gastric inflammation that was negative for H pylori, and inflammation in the esophagus with fungal stains pending.  Possible colonic lesion on noncontrast CT scan 12/10/23.  Possible apple core lesion measuring 2.5 cm in the hepatic flexure.  PET scan on 12/01/2023 without activity in that area.  She had a screening colonoscopy May 2022.  Bowel prep was fair.  A sessile 7 mm nonadenomatous polyp was removed in the transverse colon   Severe hypocalcemia (POA) She had associated facial asymmetry due to tetany.  Ionized calcium  was 0.61 .  Treated with calcium  gluconate.  Neurologically back to baseline. Calcium  level is now normal  Constipation Received Relistor and MiraLAX .  She has declined Senokot   Principal Problem:   Acute kidney injury superimposed on chronic kidney disease (HCC) Active Problems:   Breast cancer (HCC)   Chronic pain syndrome (breast cancer)   Supraventricular tachycardia (HCC)   HLD (hyperlipidemia)   Essential hypertension   Intractable nausea and vomiting   QT prolongation   Hypocalcemia   Fever of unknown origin   Sinus tachycardia   Therapeutic opioid-induced constipation (OIC)   PLAN   -- Continue soft diet -- Continue PPI BID for at least 3 months -- Complete a total of 14 days of fluconazole  therapy for presumed candida  esophagitis -- Follow up fungal stains from pathology -- If patient were to have recurrent N&V in the future, could consider repeat dilation of duodenal narrowing -- Repeat EGD in 2-3 months to assess for healing of esophagitis -- Continue Miralax  1-3 times daily for constipation control -- Patient declined an inpatient colonoscopy. During outpatient GI follow up can discuss the idea of colonoscopy for further evaluation of the hepatic flexure lesion seen on noncontrast CT scan -- We will arrange for outpatient GI follow up  Subjective   Patient is feeling better today. She has been able to tolerate a soft diet. Denies any N&V. She had a stool earlier today  Objective    Recent Labs    12/13/23 0533 12/14/23 0353  WBC 5.8 5.7  HGB 9.5* 10.3*  HCT 29.7* 32.6*  MCV 74.4* 75.1*  PLT 175 194   No results for input(s): FOLATE, VITAMINB12, FERRITIN, TIBC, IRONPCTSAT in the last 72 hours. Recent Labs    12/13/23 0533 12/14/23 0353  NA 134* 135  K 3.3* 3.3*  CL 101 103  CO2 23 24  GLUCOSE 89 76  BUN 13 11  CREATININE 0.78 0.91  CALCIUM  8.1* 8.0*   Recent Labs    12/13/23 0533 12/14/23 0353  PROT 6.3* 6.8  ALBUMIN 3.0* 3.2*  AST 49* 53*  ALT 46* 54*  ALKPHOS 134* 137*  BILITOT 0.8 0.7      Imaging:  CT CHEST WO CONTRAST CLINICAL DATA:  Chest wall pain nontraumatic. Inflammation or infection suspected. Metastatic breast cancer. * Tracking Code: BO *  EXAM: CT CHEST WITHOUT CONTRAST  TECHNIQUE: Multidetector CT  imaging of the chest was performed following the standard protocol without IV contrast.  RADIATION DOSE REDUCTION: This exam was performed according to the departmental dose-optimization program which includes automated exposure control, adjustment of the mA and/or kV according to patient size and/or use of iterative reconstruction technique.  COMPARISON:  PET-CT 11/24/2023  FINDINGS: Cardiovascular: Heart size appears normal.  No  pericardial effusion.  Mediastinum/Nodes: No axillary, supraclavicular, mediastinal or hilar adenopathy. Thyroid  gland, trachea and esophagus demonstrate no significant findings.  Lungs/Pleura:  No pleural effusion. No pneumothorax, airspace consolidation or atelectasis. As noted previously there are multiple tiny lung nodules within upper lung zone predominance which have a nonspecific appearance  These nodules are similar in size and multiplicity when compared with 11/23/2023. Reference nodule in the anterior right upper lobe measures 2 mm, image 46/4. Unchanged from previous exam. Reference nodule within the anteromedial right middle lobe measures 4 mm, image 76/4. Also unchanged.  Upper Abdomen: No acute abnormality. Exophytic cyst off the upper pole of right kidney is unchanged measuring 9 mm. No follow-up imaging recommended.  Musculoskeletal: Diffuse sclerotic bone metastases are identified within the bony thorax. These are similar in distribution and size compared with the previous examination from 11/23/2023.  No chest wall mass. Post treatment changes identified within the right breast with central surgical clips and overlying skin thickening. No chest wall mass or focal fluid collections identified.  IMPRESSION: 1. No acute cardiopulmonary abnormalities. No significant change from 11/23/2023. 2. No chest wall mass or focal fluid collections identified. 3. Diffuse sclerotic bone metastases are identified within the bony thorax. These are similar in distribution and size compared with the previous examination from 11/23/2023. 4. Multiple tiny lung nodules within upper lung zone predominance are similar in size and multiplicity when compared with 11/23/2023. These nodules are nonspecific and may be infectious or inflammatory in etiology. Metastatic disease is not excluded. 5. Post treatment changes identified within the right breast with central surgical clips and  overlying skin thickening.  Electronically Signed   By: Waddell Calk M.D.   On: 12/13/2023 04:56     Scheduled inpatient medications:   anastrozole   1 mg Oral Daily   calcitRIOL  0.5 mcg Oral Daily   methylnaltrexone  6 mg Subcutaneous Q48H   pantoprazole  (PROTONIX ) IV  40 mg Intravenous Q12H   polyethylene glycol  17 g Oral Daily   potassium chloride   40 mEq Oral Q6H   senna-docusate  1 tablet Oral BID   thiamine (VITAMIN B1) injection  100 mg Intravenous Daily   Vitamin D (Ergocalciferol)  50,000 Units Oral Q7 days   Continuous inpatient infusions:   fluconazole  (DIFLUCAN ) IV 200 mg (12/14/23 1032)   PRN inpatient medications: acetaminophen , LORazepam , metoprolol  tartrate, oxyCODONE -acetaminophen , prochlorperazine , trimethobenzamide  Vital signs in last 24 hours: Temp:  [97.4 F (36.3 C)-99.1 F (37.3 C)] 97.4 F (36.3 C) (06/25 1150) Pulse Rate:  [60-77] 67 (06/25 1150) Resp:  [16-18] 18 (06/25 1150) BP: (111-130)/(74-84) 114/83 (06/25 1150) SpO2:  [97 %-100 %] 100 % (06/25 1150) Last BM Date : 12/11/23 No intake or output data in the 24 hours ending 12/14/23 1339  Intake/Output from previous day: 06/24 0701 - 06/25 0700 In: 550 [I.V.:550] Out: -  Intake/Output this shift: No intake/output data recorded.   Physical Exam:  General: Alert female in NAD Heart:  Regular rate and rhythm.  Pulmonary: Normal respiratory effort Abdomen: Soft, mildly distended, nontender. Normal bowel sounds. Extremities: No lower extremity edema  Neurologic: Alert and oriented Psych:  Pleasant. Cooperative     LOS: 4 days   Rosario JAYSON Kidney ,MD 12/14/2023, 1:39 PM

## 2023-12-14 NOTE — Discharge Instructions (Signed)
 Follow with Primary MD [name], NP in 7 days   Get CBC, CMP, checked  by Primary MD next visit.    Activity: As tolerated with Full fall precautions use walker/cane & assistance as needed   Disposition Home    Diet: Heart Healthy/SOFT DIET   On your next visit with your primary care physician please Get Medicines reviewed and adjusted.   Please request your Prim.MD to go over all Hospital Tests and Procedure/Radiological results at the follow up, please get all Hospital records sent to your Prim MD by signing hospital release before you go home.   If you experience worsening of your admission symptoms, develop shortness of breath, life threatening emergency, suicidal or homicidal thoughts you must seek medical attention immediately by calling 911 or calling your MD immediately  if symptoms less severe.  You Must read complete instructions/literature along with all the possible adverse reactions/side effects for all the Medicines you take and that have been prescribed to you. Take any new Medicines after you have completely understood and accpet all the possible adverse reactions/side effects.   Do not drive, operating heavy machinery, perform activities at heights, swimming or participation in water activities or provide baby sitting services if your were admitted for syncope or siezures until you have seen by Primary MD or a Neurologist and advised to do so again.  Do not drive when taking Pain medications.    Do not take more than prescribed Pain, Sleep and Anxiety Medications  Special Instructions: If you have smoked or chewed Tobacco  in the last 2 yrs please stop smoking, stop any regular Alcohol  and or any Recreational drug use.  Wear Seat belts while driving.   Please note  You were cared for by a hospitalist during your hospital stay. If you have any questions about your discharge medications or the care you received while you were in the hospital after you  are discharged, you can call the unit and asked to speak with the hospitalist on call if the hospitalist that took care of you is not available. Once you are discharged, your primary care physician will handle any further medical issues. Please note that NO REFILLS for any discharge medications will be authorized once you are discharged, as it is imperative that you return to your primary care physician (or establish a relationship with a primary care physician if you do not have one) for your aftercare needs so that they can reassess your need for medications and monitor your lab values.

## 2023-12-14 NOTE — TOC CM/SW Note (Signed)
    Durable Medical Equipment  (From admission, onward)           Start     Ordered   12/14/23 1335  For home use only DME Tub bench  Once        12/14/23 1335   12/14/23 1335  For home use only DME Walker rolling  Once       Question Answer Comment  Walker: With 5 Inch Wheels   Patient needs a walker to treat with the following condition Renal failure   Patient needs a walker to treat with the following condition Generalized weakness      12/14/23 1335

## 2023-12-14 NOTE — Progress Notes (Signed)
 Occupational Therapy Treatment Patient Details Name: Chelsea Hansen MRN: [id number] DOB: Aug 05, 1970 Today's Date: 12/14/2023   History of present illness Pt is a 53 year old woman admitted on 6/21 with intractable vomiting and stroke like symptoms related to hypocalcemia, + AKI. Pt recently discharged from [facility] with similar symptoms. 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   OT comments  Pt eager to discharge home, feels ready. RN approved removal of telemetry. Pt ambulated in halls with CGA and no device. Stood at sink for grooming with supervision. Pt attempting to eat lunch at end of session prior to dressing to discharge. Daughter in room.      If plan is discharge home, recommend the following:  Assist for transportation;Help with stairs or ramp for entrance;A little help with walking and/or transfers;A little help with bathing/dressing/bathroom;Assistance with cooking/housework   Equipment Recommendations  Tub/shower seat    Recommendations for Other Services      Precautions / Restrictions Precautions Precautions: Fall Recall of Precautions/Restrictions: Intact Restrictions Weight Bearing Restrictions Per Provider Order: No       Mobility Bed Mobility                    Transfers Overall transfer level: Needs assistance Equipment used: None Transfers: Sit to/from Stand Sit to Stand: Supervision           General transfer comment: mild unsteadiness     Balance Overall balance assessment: Needs assistance   Sitting balance-Leahy Scale: Good       Standing balance-Leahy Scale: Fair                             ADL either performed or assessed with clinical judgement   ADL   Eating/Feeding: Independent;Sitting   Grooming: Wash/dry hands;Standing;Supervision/safety                               Functional mobility during ADLs: Contact guard assist       Extremity/Trunk Assessment              Vision       Perception     Praxis     Communication Communication Communication: No apparent difficulties   Cognition Arousal: Alert Behavior During Therapy: Flat affect Cognition: No apparent impairments                               Following commands: Intact        Cueing   Cueing Techniques: Verbal cues  Exercises      Shoulder Instructions       General Comments      Pertinent Vitals/ Pain       Pain Assessment Pain Assessment: Faces Faces Pain Scale: No hurt  Home Living                                          Prior Functioning/Environment              Frequency  Min 2X/week        Progress Toward Goals  OT Goals(current goals can now be found in the care plan section)  Progress towards OT goals: Progressing  toward goals  Acute Rehab OT Goals OT Goal Formulation: With patient Time For Goal Achievement: 12/26/23 Potential to Achieve Goals: Good  Plan      Co-evaluation                 AM-PAC OT 6 Clicks Daily Activity     Outcome Measure   Help from another person eating meals?: None Help from another person taking care of personal grooming?: A Little Help from another person toileting, which includes using toliet, bedpan, or urinal?: A Little Help from another person bathing (including washing, rinsing, drying)?: A Little Help from another person to put on and taking off regular upper body clothing?: None Help from another person to put on and taking off regular lower body clothing?: A Little 6 Click Score: 20    End of Session Equipment Utilized During Treatment: Gait belt  OT Visit Diagnosis: Unsteadiness on feet (R26.81);Other abnormalities of gait and mobility (R26.89);Muscle weakness (generalized) (M62.81)   Activity Tolerance Patient tolerated treatment well   Patient Left in chair;with call bell/phone within reach;with  family/visitor present   Nurse Communication Other (comment) (ok to take off telemetry)        Time: 8662-8645 OT Time Calculation (min): 17 min  Charges: OT General Charges $OT Visit: 1 Visit OT Treatments $Self Care/Home Management : 8-22 mins  Mliss HERO, OTR/L Acute Rehabilitation Services Office: [phone]   Kennth Mliss Helling 12/14/2023, 2:07 PM

## 2023-12-14 NOTE — Progress Notes (Signed)
 Physician Discharge Summary  Chelsea Hansen FMW:[id number] DOB: May 21, 1971 DOA: 12/10/2023  PCP: Chelsea Rosaline SQUIBB, NP  Admit date: 12/10/2023 Discharge date: 12/14/2023  Admitted From: (Home) Disposition:  (Home )  Recommendations for Outpatient Follow-up:  Follow up with PCP in 1-2 weeks Please obtain BMP/CBC in one week Please follow up on the following pending results:   Diet recommendation: Soft diet Brief/Interim Summary:   53 y.o. female with medical history significant of CKD stage IIIa, history of recurrent hospital admission for intractable nausea vomiting with associated electrolyte derangement, supraventricular tachycardia, asthma, essential hypertension, hyperlipidemia, chronic pulmonary nodule, cancer related pain and metastatic breast cancer status post neoadjuvant chemotherapy, lumpectomy, SNL biopsy status post radiation currently on anastrozole  and abemaciclib , recent issues with recurrent nausea vomiting requiring admissions.    Presented to emergency department accompanied by EMS with concern for stroke.  Patient son reported last known well 11 AM 6/21.  EMS reported aphasia worsening throughout transport and left-sided facial droop/asymmetry.  She also has continued nausea vomiting at home, further workup in the ER rule out stroke but confirmed severe hypocalcemia likely causing tetany and facial asymmetry from muscle spasm.  She was seen by neurology, GI and admitted to the hospital.  Hypocalcemia Vitamin D deficiency Secondary hyperparathyroidism -Significant nausea and vomiting, requiring multiple admissions to the hospital. 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  Finish total of 14 days. - Repeat EGD in 2 to 3 months to assess healing of esophagitis - Patient continues to have nausea and vomiting then may consider dilation of duodenal stenosis.  She has been tolerating her diet over last 24 hours.     Recurrent nausea vomiting with severe dehydration, AKI, hypokalemia, hypocalcemia, hypomagnesemia.  GI on board, CT scan abdomen pelvis noted with possible mucosal lesion in the hepatic flexure, please see above discussion regarding her endoscopy findings, she declined colonoscopy while inpatient, she is to follow with GI as an outpatient regarding repeat endoscopy and outpatient colonoscopy.   - Electrolyte has been replaced  History of metastatic breast cancer.  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  Case discussed with oncologist Dr. Godino who request chest CT noncontrast for staging has been obtained suggestive of multiple nodules/malignancy cannot be ruled out, Dr. Valere wants to make sure that patient's home medication Verzinio has been stopped permanently upon discharge.  Discussed with the patient she is aware, she has a follow-up with Dr. Godina tomorrow.   Chronic cancer patient with narcotic bowel.  Supportive care, bowel regimen continued.      Discharge Diagnoses:  Principal Problem:   Acute kidney injury superimposed on chronic kidney disease (HCC) Active Problems:   Hypocalcemia   Fever of unknown origin   Breast cancer (HCC)   Chronic  pain syndrome (breast cancer)   Supraventricular tachycardia (HCC)  HLD (hyperlipidemia)   Essential hypertension   Intractable nausea and vomiting   QT prolongation   Sinus tachycardia   Therapeutic opioid-induced constipation (OIC)    Discharge Instructions  Discharge Instructions     Diet - low sodium heart healthy   Complete by: As directed    Discharge instructions   Complete by: As directed    Follow with Primary MD Chelsea Rosaline SQUIBB, NP in 7 days   Get CBC, CMP, checked  by Primary MD next visit.    Activity: As tolerated with Full fall precautions use walker/cane & assistance as needed   Disposition Home    Diet: Heart Healthy/SOFT DIET   On your next visit with your primary care physician please Get Medicines reviewed and adjusted.   Please request your Prim.MD to go over all Hospital Tests and Procedure/Radiological results at the follow up, please get all Hospital records sent to your Prim MD by signing hospital release before you go home.   If you experience worsening of your admission symptoms, develop shortness of breath, life threatening emergency, suicidal or homicidal thoughts you must seek medical attention immediately by calling 911 or calling your MD immediately  if symptoms less severe.  You Must read complete instructions/literature along with all the possible adverse reactions/side effects for all the Medicines you take and that have been prescribed to you. Take any new Medicines after you have completely understood and accpet all the possible adverse reactions/side effects.   Do not drive, operating heavy machinery, perform activities at heights, swimming or participation in water activities or provide baby sitting services if your were admitted for syncope or siezures until you have seen by Primary MD or a Neurologist and advised to do so again.  Do not drive when taking Pain medications.    Do not take more than prescribed Pain, Sleep and  Anxiety Medications  Special Instructions: If you have smoked or chewed Tobacco  in the last 2 yrs please stop smoking, stop any regular Alcohol  and or any Recreational drug use.  Wear Seat belts while driving.   Please note  You were cared for by a hospitalist during your hospital stay. If you have any questions about your discharge medications or the care you received while you were in the hospital after you are discharged, you can call the unit and asked to speak with the hospitalist on call if the hospitalist that took care of you is not available. Once you are discharged, your primary care physician will handle any further medical issues. Please note that NO REFILLS for any discharge medications will be authorized once you are discharged, as it is imperative that you return to your primary care physician (or establish a relationship with a primary care physician if you do not have one) for your aftercare needs so that they can reassess your need for medications and monitor your lab values.   Increase activity slowly   Complete by: As directed       Allergies as of 12/14/2023   No Known Allergies      Medication List     PAUSE taking these medications    propranolol  10 MG tablet Wait to take this until your doctor or other care provider tells you to start again. Commonly known as: INDERAL  Take 1 tablet (10 mg total) by mouth 2 (two) times daily. Please call [phone] to schedule an appointment for future refills. Thank you. 3rd attempt.       STOP taking  these medications    abemaciclib  50 MG tablet Commonly known as: VERZENIO    OxyCONTIN  10 MG 12 hr tablet Generic drug: oxyCODONE    potassium chloride  SA 20 MEQ tablet Commonly known as: KLOR-CON  M       TAKE these medications    anastrozole  1 MG tablet Commonly known as: ARIMIDEX  Take 1 tablet (1 mg total) by mouth daily.   calcitRIOL 0.5 MCG capsule Commonly known as: ROCALTROL Take 1 capsule (0.5 mcg  total) by mouth daily. Start taking on: December 15, 2023   calcium -vitamin D 500-5 MG-MCG tablet Commonly known as: OSCAL WITH D Take 1 tablet by mouth 3 (three) times daily. What changed: when to take this   fluconazole  100 MG tablet Commonly known as: Diflucan  Take 2 tablets (200 mg total) by mouth daily for 12 days.   gabapentin  300 MG capsule Commonly known as: NEURONTIN  Take 1 capsule (300 mg total) by mouth 3 (three) times daily.   montelukast  10 MG tablet Commonly known as: SINGULAIR  Take 1 tablet (10 mg total) by mouth at bedtime. What changed:  when to take this reasons to take this   oxyCODONE -acetaminophen  7.5-325 MG tablet Commonly known as: Percocet Take 1-2 tablets by mouth every 6 (six) hours as needed for severe pain (pain score 7-10).   pantoprazole  40 MG tablet Commonly known as: Protonix  Take 1 tablet (40 mg total) by mouth 2 (two) times daily.   polyethylene glycol powder 17 GM/SCOOP powder Commonly known as: GLYCOLAX /MIRALAX  Mix 17 g in 4 oz of liquid and take by mouth 2 (two) times daily as needed.   prochlorperazine  10 MG tablet Commonly known as: COMPAZINE  Take 1 tablet (10 mg total) by mouth every 6 (six) hours as needed for nausea or vomiting.   scopolamine  1 MG/3DAYS Commonly known as: TRANSDERM-SCOP Place 1 patch (1.5 mg total) onto the skin every 3 (three) days as needed (Dizziness, nausea or vomiting).   senna 8.6 MG Tabs tablet Commonly known as: SENOKOT Take 2 tablets (17.2 mg total) by mouth at bedtime.   Senna-Plus 8.6-50 MG tablet Generic drug: senna-docusate Take 1 tablet by mouth daily.   Ventolin  HFA 108 (90 Base) MCG/ACT inhaler Generic drug: albuterol  Inhale 2 puffs by mouth into the lungs every 6 hours as needed for wheezing or shortness of breath.               Durable Medical Equipment  (From admission, onward)           Start     Ordered   12/14/23 1335  For home use only DME Tub bench  Once         12/14/23 1335   12/14/23 1335  For home use only DME Walker rolling  Once       Question Answer Comment  Walker: With 5 Inch Wheels   Patient needs a walker to treat with the following condition Renal failure   Patient needs a walker to treat with the following condition Generalized weakness      12/14/23 1335            No Known Allergies  Consultations: Gastroenterology Oncology Neurology   Procedures/Studies: CT CHEST WO CONTRAST Result Date: 12/13/2023 CLINICAL DATA:  Chest wall pain nontraumatic. Inflammation or infection suspected. Metastatic breast cancer. * Tracking Code: BO * EXAM: CT CHEST WITHOUT CONTRAST TECHNIQUE: Multidetector CT imaging of the chest was performed following the standard protocol without IV contrast. RADIATION DOSE REDUCTION: This exam was performed according to  the departmental dose-optimization program which includes automated exposure control, adjustment of the mA and/or kV according to patient size and/or use of iterative reconstruction technique. COMPARISON:  PET-CT 11/24/2023 FINDINGS: Cardiovascular: Heart size appears normal.  No pericardial effusion. Mediastinum/Nodes: No axillary, supraclavicular, mediastinal or hilar adenopathy. Thyroid  gland, trachea and esophagus demonstrate no significant findings. Lungs/Pleura: No pleural effusion. No pneumothorax, airspace consolidation or atelectasis. As noted previously there are multiple tiny lung nodules within upper lung zone predominance which have a nonspecific appearance These nodules are similar in size and multiplicity when compared with 11/23/2023. Reference nodule in the anterior right upper lobe measures 2 mm, image 46/4. Unchanged from previous exam. Reference nodule within the anteromedial right middle lobe measures 4 mm, image 76/4. Also unchanged. Upper Abdomen: No acute abnormality. Exophytic cyst off the upper pole of right kidney is unchanged measuring 9 mm. No follow-up imaging recommended.  Musculoskeletal: Diffuse sclerotic bone metastases are identified within the bony thorax. These are similar in distribution and size compared with the previous examination from 11/23/2023. No chest wall mass. Post treatment changes identified within the right breast with central surgical clips and overlying skin thickening. No chest wall mass or focal fluid collections identified. IMPRESSION: 1. No acute cardiopulmonary abnormalities. No significant change from 11/23/2023. 2. No chest wall mass or focal fluid collections identified. 3. Diffuse sclerotic bone metastases are identified within the bony thorax. These are similar in distribution and size compared with the previous examination from 11/23/2023. 4. Multiple tiny lung nodules within upper lung zone predominance are similar in size and multiplicity when compared with 11/23/2023. These nodules are nonspecific and may be infectious or inflammatory in etiology. Metastatic disease is not excluded. 5. Post treatment changes identified within the right breast with central surgical clips and overlying skin thickening. Electronically Signed   By: Waddell Calk M.D.   On: 12/13/2023 04:56   DG Abd Portable 1V Result Date: 12/12/2023 EXAM: 1 VIEW XRAY OF THE ABDOMEN SUPINE 12/12/2023 06:03:00 AM COMPARISON: 1 view abdomen 01/04/2024. CT of the abdomen and pelvis 12/10/2023 and 12/06/2023. CLINICAL HISTORY: Nausea and vomiting. FINDINGS: BOWEL: Nonobstructive bowel gas pattern. PERITONEUM AND SOFT TISSUES: No abnormal calcifications. BONES: No acute osseous abnormality. IMPRESSION: 1. No acute findings. Electronically signed by: Lonni Necessary MD 12/12/2023 06:21 AM EDT RP Workstation: [id number]   CT ABDOMEN PELVIS WO CONTRAST Result Date: 12/10/2023 CLINICAL DATA:  Abdominal pain, vomiting. EXAM: CT ABDOMEN AND PELVIS WITHOUT CONTRAST TECHNIQUE: Multidetector CT imaging of the abdomen and pelvis was performed following the standard protocol without IV  contrast. RADIATION DOSE REDUCTION: This exam was performed according to the departmental dose-optimization program which includes automated exposure control, adjustment of the mA and/or kV according to patient size and/or use of iterative reconstruction technique. COMPARISON:  0/17/2025. FINDINGS: Lower chest: No acute abnormality. No pleural or pericardial effusion. Hepatobiliary: No focal liver abnormality is seen. No gallstones, gallbladder wall thickening, or biliary dilatation. Pancreas: Unremarkable. No pancreatic ductal dilatation or surrounding inflammatory changes. Spleen: Normal in size without focal abnormality. Adrenals/Urinary Tract: Adrenal glands are unremarkable. Kidneys are normal, without renal calculi, focal lesion, or hydronephrosis. Bladder is unremarkable. Stomach/Bowel: No bowel dilatation to suggest obstruction. Diffuse colonic diverticulosis. There is mucosal thickening in the hepatic flexure and possible apple-core lesion measuring 2.5 cm. This can be better evaluated endoscopically. Unremarkable appearance of the appendix. Vascular/Lymphatic: No significant vascular findings are present. No enlarged abdominal or pelvic lymph nodes. Reproductive: Status post hysterectomy. No adnexal masses. Other: No abdominal wall hernia or  abnormality. No abdominopelvic ascites. Musculoskeletal: Diffuse osteoblastic changes. Stable pathologic compression deformity L3. IMPRESSION: 1. Diffuse osteoblastic changes consistent with metastatic disease. 2. Possible colonic mucosal lesion in the hepatic flexure. This can be better evaluated endoscopically. 3. Diverticulosis. Electronically Signed   By: Fonda Field M.D.   On: 12/10/2023 22:22   DG Chest Port 1 View Result Date: 12/10/2023 CLINICAL DATA:  Questionable sepsis - evaluate for abnormality EXAM: PORTABLE CHEST - 1 VIEW COMPARISON:  12/05/2023. FINDINGS: Cardiac silhouette is unremarkable. No pneumothorax or pleural effusion. The lungs are  clear. The visualized skeletal structures are unremarkable. IMPRESSION: No acute cardiopulmonary process. Electronically Signed   By: Fonda Field M.D.   On: 12/10/2023 21:55   CT HEAD CODE STROKE WO CONTRAST Result Date: 12/10/2023 CLINICAL DATA:  Code stroke.  Acute neurologic deficit EXAM: CT HEAD WITHOUT CONTRAST TECHNIQUE: Contiguous axial images were obtained from the base of the skull through the vertex without intravenous contrast. RADIATION DOSE REDUCTION: This exam was performed according to the departmental dose-optimization program which includes automated exposure control, adjustment of the mA and/or kV according to patient size and/or use of iterative reconstruction technique. COMPARISON:  12/05/2023 FINDINGS: Brain: There is no mass, hemorrhage or extra-axial collection. The size and configuration of the ventricles and extra-axial CSF spaces are normal. The brain parenchyma is normal, without evidence of acute or chronic infarction. Vascular: No abnormal hyperdensity of the major intracranial arteries or dural venous sinuses. No intracranial atherosclerosis. Skull: The visualized skull base, calvarium and extracranial soft tissues are normal. Sinuses/Orbits: No fluid levels or advanced mucosal thickening of the visualized paranasal sinuses. No mastoid or middle ear effusion. The orbits are normal. ASPECTS [facility] Stroke Program Early CT Score) - Ganglionic level infarction (caudate, lentiform nuclei, internal capsule, insula, M1-M3 cortex): 7 - Supraganglionic infarction (M4-M6 cortex): 3 Total score (0-10 with 10 being normal): 10 IMPRESSION: 1. No acute intracranial abnormality. 2. ASPECTS is 10. These results were communicated to Dr. Lola Jernigan at 8:11 pm on 12/10/2023 by text page via the [facility] messaging system. Electronically Signed   By: Franky Stanford M.D.   On: 12/10/2023 20:14   EEG adult Result Date: 12/06/2023 Shelton Arlin KIDD, MD     12/07/2023  9:22 AM Patient Name: DAISI KENTNER MRN: [id number] Epilepsy Attending: Arlin KIDD Shelton Referring Physician/Provider: Sherrill Alejandro Donovan, DO Date: 12/06/2023 Duration: 22.50 mins Patient history: 54yo F with seizure like activity. EEG to evaluate for seizure. Level of alertness: Awake, asleep AEDs during EEG study: GBP Technical aspects: This EEG study was done with scalp electrodes positioned according to the 10-20 International system of electrode placement. Electrical activity was reviewed with band pass filter of 1-70Hz , sensitivity of 7 uV/mm, display speed of 17mm/sec with a 60Hz  notched filter applied as appropriate. EEG data were recorded continuously and digitally stored.  Video monitoring was available and reviewed as appropriate. Description: The posterior dominant rhythm consists of 9-10 Hz activity of moderate voltage (25-35 uV) seen predominantly in posterior head regions, symmetric and reactive to eye opening and eye closing. Sleep was characterized by vertex waves, sleep spindles (12 to 14 Hz), maximal frontocentral region. Physiologic photic driving was not seen during photic stimulation.  Hyperventilation was not performed.   IMPRESSION: This study is within normal limits. No seizures or epileptiform discharges were seen throughout the recording. A normal interictal EEG does not exclude the diagnosis of epilepsy. Arlin KIDD Shelton   CT ABDOMEN PELVIS WO CONTRAST Result Date: 12/06/2023 CLINICAL DATA:  Abdominal pain, acute, nonlocalized Nausea and Vomiting Hx recent admission for similar symptoms 6/5 - 6/7 and improved with symptomatic treatment. Previous imaging with CT A/P on 6/4 with moderate stool burden but no acute findings; however PET on 6/5 did note stomach and proximal duodenal EXAM: CT ABDOMEN AND PELVIS WITHOUT CONTRAST TECHNIQUE: Multidetector CT imaging of the abdomen and pelvis was performed following the standard protocol without IV contrast. RADIATION DOSE REDUCTION: This exam was performed according to the  departmental dose-optimization program which includes automated exposure control, adjustment of the mA and/or kV according to patient size and/or use of iterative reconstruction technique. COMPARISON:  PET CT 11/24/2023 ,CT abdomen pelvis 11/23/2023 FINDINGS: Lower chest: Small hiatal hernia.  No acute abnormality. Hepatobiliary: No focal lesion. No gallstones, gallbladder wall thickening, or pericholecystic fluid. No biliary dilatation. Pancreas: Vague fat stranding along the proximal pancreas. No main pancreatic ductal dilatation. Spleen: Normal in size without focal abnormality. Splenule is noted. Adrenals/Urinary Tract: No adrenal nodule bilaterally. No nephrolithiasis and no hydronephrosis. No definite contour-deforming renal mass. No ureterolithiasis or hydroureter. The urinary bladder is unremarkable. Stomach/Bowel: Stomach is within normal limits. No evidence of bowel wall thickening or dilatation. Colonic diverticulosis. Appendix appears normal. Vascular/Lymphatic: No abdominal aorta or iliac aneurysm. No abdominal, pelvic, or inguinal lymphadenopathy. Reproductive: Status post hysterectomy. No adnexal masses. Other: No intraperitoneal free fluid. No intraperitoneal free gas. No organized fluid collection. Musculoskeletal: No abdominal wall hernia or abnormality. Scattered appendicular and axial skeleton metastatic osseous lesions. No acute displaced fracture. Chronic compression fracture of the L3 level with likely underlying vertebral body hemangioma. Multilevel degenerative changes of the spine. IMPRESSION: 1. Vague fat stranding along the proximal pancreas. Recommend correlation with lipase levels. 2. Small hiatal hernia. 3. Colonic diverticulosis with no acute diverticulitis. 4. Scattered appendicular and axial sclerotic metastatic osseous lesions. Electronically Signed   By: Morgane  Naveau M.D.   On: 12/06/2023 00:47   DG CHEST PORT 1 VIEW Result Date: 12/05/2023 CLINICAL DATA:  SOB EXAM: PORTABLE  CHEST 1 VIEW COMPARISON:  01/28/2021. FINDINGS: The heart size and mediastinal contours are within normal limits. Both lungs are clear. The visualized skeletal structures are unremarkable. IMPRESSION: No active disease. Electronically Signed   By: Fonda Field M.D.   On: 12/05/2023 19:25   MR BRAIN WO CONTRAST Result Date: 12/05/2023 CLINICAL DATA:  Mental status change, unknown cause EXAM: MRI HEAD WITHOUT CONTRAST TECHNIQUE: Multiplanar, multiecho pulse sequences of the brain and surrounding structures were obtained without intravenous contrast. COMPARISON:  Same day CT head. FINDINGS: Brain: No acute infarction, hemorrhage, hydrocephalus, extra-axial collection or mass lesion. Mild to moderate scattered T2/FLAIR hyperintensities the white matter, nonspecific but compatible with chronic microvascular ischemic change. Vascular: Major arterial flow voids are maintained at the skull base. Skull and upper cervical spine: Normal marrow signal. Sinuses/Orbits: Right maxillary sinus retention cyst. Otherwise, largely clear sinuses. No acute orbital findings. Other: No mastoid effusions. IMPRESSION: No evidence of acute intracranial abnormality. Electronically Signed   By: Gilmore GORMAN Molt M.D.   On: 12/05/2023 18:46   DG Abd 1 View Result Date: 12/05/2023 CLINICAL DATA:  Nausea and vomiting. EXAM: ABDOMEN - 1 VIEW COMPARISON:  11/01/2013 FINDINGS: Supine abdomen shows no gaseous bowel dilatation to suggest obstruction. No unexpected abdominopelvic calcification. Degenerative changes are noted in the lumbar spine. IMPRESSION: Nonobstructive bowel gas pattern. Electronically Signed   By: Camellia Candle M.D.   On: 12/05/2023 05:16   CT Head Wo Contrast Result Date: 12/05/2023 CLINICAL DATA:  History of breast cancer  presenting with diplopia with dizziness and weakness. EXAM: CT HEAD WITHOUT CONTRAST TECHNIQUE: Contiguous axial images were obtained from the base of the skull through the vertex without  intravenous contrast. RADIATION DOSE REDUCTION: This exam was performed according to the departmental dose-optimization program which includes automated exposure control, adjustment of the mA and/or kV according to patient size and/or use of iterative reconstruction technique. COMPARISON:  June 29, 2022 FINDINGS: Brain: No evidence of acute infarction, hemorrhage, hydrocephalus, extra-axial collection or mass lesion/mass effect. Vascular: No hyperdense vessel or unexpected calcification. Skull: Normal. Negative for fracture or focal lesion. Sinuses/Orbits: No acute finding. Other: None. IMPRESSION: No acute intracranial pathology. Electronically Signed   By: Suzen Dials M.D.   On: 12/05/2023 03:29   NM PET Image Restag (PS) Skull Base To Thigh Result Date: 12/01/2023 CLINICAL DATA:  Subsequent treatment strategy for metastatic breast cancer. EXAM: NUCLEAR MEDICINE PET SKULL BASE TO THIGH TECHNIQUE: 9.92 mCi F-18 FDG was injected intravenously. Full-ring PET imaging was performed from the skull base to thigh after the radiotracer. CT data was obtained and used for attenuation correction and anatomic localization. Fasting blood glucose: 100 mg/dl COMPARISON:  PET-CT 87/79/7975. CT scan chest abdomen pelvis 11/23/2023. FINDINGS: Mediastinal blood pool activity: SUV max 3.2 Liver activity: SUV max 3.8 NECK: No specific abnormal uptake seen above blood pool in the neck including lymph node change of the submandibular, posterior triangle or internal jugular region. The previous hypermetabolic submandibular node is no longer identified. Near symmetric uptake of radiotracer along the visualized intracranial compartment. Incidental CT findings: The submandibular glands, parotid glands and thyroid  gland are unremarkable. There is some small less than 1 cm in size short axis nodes in the neck, not pathologic by size criteria and similar to previous. Right maxillary sinus mucous retention cyst or polyp. Jewelry along  the tongue. CHEST: No specific abnormal radiotracer uptake seen above blood pool in the axillary region, hilum or mediastinum. Previous areas of uptake and thoracic nodal enlargement are improved from the prior PET-CT but were described as being improved on the more recent CT scan of the chest abdomen and pelvis. There is some nonspecific mild uptake along the lower esophagus. Please correlate with any symptoms. No abnormal lung uptake identified this time. Incidental CT findings: Stable cardiac size. No pericardial effusion. The thoracic aorta is normal course and caliber. Small hiatal hernia. Small area of calcification along the SVC near the junction to the azygous. Breathing motion. No consolidation, pneumothorax or effusion. As seen previously there are several scattered tiny nodules identified in the lungs particularly the right upper lobe in a similar distribution to the recent prior examination. These appear progressive from the older CT scan as part of the PET of December 2024. No associated abnormal uptake but again these lesions are quite small. Please correlate for any clinical presentation otherwise would recommend short follow-up. ABDOMEN/PELVIS: There is physiologic distribution radiotracer along the parenchymal organs, bowel and renal collecting systems. On prior PET-CT there are some mildly enlarged hypermetabolic upper retroperitoneal nodes which again has shown improvement on the more recent CT scan and today do not show any abnormal residual uptake. There are some right inguinal nodes which show uptake today however. Example has maximum SUV value of 6.3 and on image 188 node measures 16 by 15 mm. This same node in December 2024 would measured 14 x 13 mm the and had maximum SUV of only 1.4. A new area of disease is possible but there is a differential. Incidental CT findings:  Grossly the liver, spleen, adrenal glands and pancreas are unremarkable. Gallbladder is present. There is some contrast  within the renal collecting systems the kidneys but this could relate to the recent CT scan of chest abdomen and pelvis. Contrast in the bladder which is relatively contracted. Normal caliber aorta and IVC. Bowel has a normal course and caliber. Scattered colonic stool. Scattered colonic diverticula. The small bowel is nondilated. Stomach is distended with fluid and debris. Questionable stranding in the area of the second portion the duodenum. There is a caliber change in the bowel this location. Please correlate with clinical findings. There is no specific abnormal uptake in this location. SKELETON: Once again there is some scattered sclerotic bone metastases identified particularly along the spine and pelvis. Additional areas along the sternum as well as the clavicles. By CT distribution is similar. Few areas are larger such as along the sternal region on image 88 compared to the study of December 2024. Is also some mild patchy marrow uptake identified in several areas which are increased such as along the left clavicular head with maximum SUV of 5.3. Sternal area on image 76 of 3.5 SUV maximum. T12 vertebral body maximum SUV value 5.3. Other examples as well of more uptake and more sclerosis. Incidental CT findings: Scattered degenerative changes identified. Curvature of the spine. Compression deformity of L3. IMPRESSION: Sclerotic bone metastases again identified. Several areas of sclerosis of increased in appearance compared to December 2024 and show more uptake. Distribution of disease is similar. Stable compression of L3 vertebral body. The previous uptake and increased size nodal disease on the December 2024 study has shown significant improvement. There is 1 lymph node in the right inguinal region which is slightly larger and shows some increased uptake. This could be a metastatic node but does have a differential. Please correlate with clinical findings. Once again several tiny lung nodules identified  particularly in the right upper lobe. These do not show abnormal uptake. Please correlate for clinical presentation. This would have a differential and recommend attention on close follow-up. Distended stomach with fluid and debris with a caliber change at the proximal duodenum. There is slight soft tissue thickening this location without abnormal uptake. Please correlate with specific symptoms. Further workup as clinically appropriate. Electronically Signed   By: Ranell Bring M.D.   On: 12/01/2023 15:04   CT Angio Chest PE W and/or Wo Contrast Result Date: 11/23/2023 CLINICAL DATA:  Chest pain, shortness of breath EXAM: CT ANGIOGRAPHY CHEST WITH CONTRAST TECHNIQUE: Multidetector CT imaging of the chest was performed using the standard protocol during bolus administration of intravenous contrast. Multiplanar CT image reconstructions and MIPs were obtained to evaluate the vascular anatomy. RADIATION DOSE REDUCTION: This exam was performed according to the departmental dose-optimization program which includes automated exposure control, adjustment of the mA and/or kV according to patient size and/or use of iterative reconstruction technique. CONTRAST:  OMNIPAQUE  IOHEXOL  350 MG/ML SOLN COMPARISON:  None Available. FINDINGS: Cardiovascular: Satisfactory opacification of the pulmonary arteries to the segmental level. No evidence of pulmonary embolism. Normal heart size. No pericardial effusion. Mediastinum/Nodes: No enlarged mediastinal, hilar, or axillary lymph nodes. Thyroid  gland, trachea, and esophagus demonstrate no significant findings. Lungs/Pleura: There is an ill-defined nodular pattern throughout both lungs with small small nodular densities that measured between a few mm up to 4 mm bilaterally. These findings in combination with the previously described findings of metastatic bone disease raises the possibility of metastatic lung disease. There is no other infiltrates or consolidations  or pleural  effusions. Upper Abdomen: No acute abnormality. Musculoskeletal: Diffuse sclerotic metastatic bone disease of the thoracic spine, sternum and ribs. Review of the MIP images confirms the above findings. IMPRESSION: *No evidence of pulmonary embolism. *Diffuse sclerotic metastatic bone disease. *Ill-defined nodular pattern throughout both lungs with small nodular densities that measured between a few mm up to 4 mm bilaterally. These findings in combination with the previously described findings of metastatic bone disease raises the possibility of metastatic lung disease. Electronically Signed   By: Franky Chard M.D.   On: 11/23/2023 15:20   CT ABDOMEN PELVIS W CONTRAST Result Date: 11/23/2023 CLINICAL DATA:  Abdominal pain EXAM: CT ABDOMEN AND PELVIS WITH CONTRAST TECHNIQUE: Multidetector CT imaging of the abdomen and pelvis was performed using the standard protocol following bolus administration of intravenous contrast. RADIATION DOSE REDUCTION: This exam was performed according to the departmental dose-optimization program which includes automated exposure control, adjustment of the mA and/or kV according to patient size and/or use of iterative reconstruction technique. CONTRAST:  OMNIPAQUE  IOHEXOL  350 MG/ML SOLN COMPARISON:  CT abdomen and pelvis August 23, 2022 FINDINGS: Lower chest: No infiltrates or consolidations, no pleural effusions Hepatobiliary: Liver normal size no masses no biliary dilatation. Gallbladder unremarkable. No gallstones. Pancreas: Pancreas normal size. No masses calcifications or inflammatory changes. Spleen: Spleen normal size.  No masses. Adrenals/Urinary Tract: Adrenal glands are normal size. Follow-up recommended. Kidneys are normal. No masses calcifications or hydronephrosis Stomach/Bowel: No small or large bowel obstruction or inflammatory changes. Diffuse pan diverticulosis unchanged since prior. Moderate amount of residual fecal material throughout the colon without obstruction  or constipation. Vascular/Lymphatic: No significant vascular findings are present. No enlarged abdominal or pelvic lymph nodes. Reproductive: .  No masses. Bladder unremarkable. Other: Anterior abdominal wall unremarkable without evidence of umbilical or inguinal hernias Musculoskeletal: Again noted multiple sclerotic lesions involving the thoracolumbar spine with fracture deformity of L3 correlates with metastatic bone disease. IMPRESSION: *No acute findings in the abdomen or pelvis. *Diffuse pan diverticulosis unchanged since prior. *Moderate amount of residual fecal material throughout the colon without obstruction or constipation. *Again noted multiple sclerotic lesions involving the thoracolumbar spine with fracture deformity of L3 correlates with metastatic bone disease. Electronically Signed   By: Franky Chard M.D.   On: 11/23/2023 15:17      Subjective: He denies any complaints today, tolerating her oral intake, no nausea, no vomiting, no abdominal pain  Discharge Exam: Vitals:   12/14/23 0811 12/14/23 1150  BP: 116/74 114/83  Pulse: 70 67  Resp:  18  Temp: 98.3 F (36.8 C) (!) 97.4 F (36.3 C)  SpO2: 98% 100%   Vitals:   12/13/23 2334 12/14/23 0628 12/14/23 0811 12/14/23 1150  BP: 124/74 111/74 116/74 114/83  Pulse: 77  70 67  Resp: 18 16  18   Temp: 99 F (37.2 C) 98.5 F (36.9 C) 98.3 F (36.8 C) (!) 97.4 F (36.3 C)  TempSrc: Oral Oral Oral Oral  SpO2: 97%  98% 100%  Weight:        General: Pt is alert, awake, not in acute distress Abdominal: Soft, NT, ND, Extremities: no edema, no cyanosis    The results of significant diagnostics from this hospitalization (including imaging, microbiology, ancillary and laboratory) are listed below for reference.     Microbiology: Recent Results (from the past 240 hours)  Blood Culture (routine x 2)     Status: None (Preliminary result)   Collection Time: 12/10/23  8:22 PM   Specimen: BLOOD  RIGHT HAND  Result Value Ref  Range Status   Specimen Description BLOOD RIGHT HAND  Final   Special Requests   Final    BOTTLES DRAWN AEROBIC AND ANAEROBIC Blood Culture adequate volume   Culture   Final    NO GROWTH 4 DAYS Performed at [facility], [address], [place], [place] [address]    Report Status PENDING  Incomplete  Resp panel by RT-PCR (RSV, Flu A&B, Covid) Anterior Nasal Swab     Status: None   Collection Time: 12/10/23  8:48 PM   Specimen: Anterior Nasal Swab  Result Value Ref Range Status   SARS Coronavirus 2 by RT PCR NEGATIVE NEGATIVE Final   Influenza A by PCR NEGATIVE NEGATIVE Final   Influenza B by PCR NEGATIVE NEGATIVE Final    Comment: (NOTE) The Xpert Xpress SARS-CoV-2/FLU/RSV plus assay is intended as an aid in the diagnosis of influenza from Nasopharyngeal swab specimens and should not be used as a sole basis for treatment. Nasal washings and aspirates are unacceptable for Xpert Xpress SARS-CoV-2/FLU/RSV testing.  Fact Sheet for Patients: BloggerCourse.com  Fact Sheet for Healthcare Providers: SeriousBroker.it  This test is not yet approved or cleared by the [place]  FDA and has been authorized for detection and/or diagnosis of SARS-CoV-2 by FDA under an Emergency Use Authorization (EUA). This EUA will remain in effect (meaning this test can be used) for the duration of the COVID-19 declaration under Section 564(b)(1) of the Act, 21 U.S.C. section 360bbb-3(b)(1), unless the authorization is terminated or revoked.     Resp Syncytial Virus by PCR NEGATIVE NEGATIVE Final    Comment: (NOTE) Fact Sheet for Patients: BloggerCourse.com  Fact Sheet for Healthcare Providers: SeriousBroker.it  This test is not yet approved or cleared by the [place]  FDA and has been authorized for detection and/or diagnosis of SARS-CoV-2 by FDA under an Emergency Use Authorization  (EUA). This EUA will remain in effect (meaning this test can be used) for the duration of the COVID-19 declaration under Section 564(b)(1) of the Act, 21 U.S.C. section 360bbb-3(b)(1), unless the authorization is terminated or revoked.  Performed at [facility], [address], [place], [place] [address]   Blood Culture (routine x 2)     Status: None (Preliminary result)   Collection Time: 12/10/23  8:48 PM   Specimen: BLOOD  Result Value Ref Range Status   Specimen Description BLOOD RIGHT ANTECUBITAL  Final   Special Requests   Final    BOTTLES DRAWN AEROBIC AND ANAEROBIC Blood Culture adequate volume   Culture   Final    NO GROWTH 4 DAYS Performed at [facility], [address], [place], [place] [address]    Report Status PENDING  Incomplete     Labs: BNP (last 3 results) No results for input(s): BNP in the last 8760 hours. Basic Metabolic Panel: Recent Labs  Lab 12/10/23 2004 12/10/23 2005 12/10/23 2048 12/11/23 0045 12/11/23 0518 12/11/23 0818 12/13/23 0533 12/14/23 0353  NA 137 132*  --  135 135  --  134* 135  K 3.5 3.3*  --  3.0* 3.0*  --  3.3* 3.3*  CL 73* 76*  --  81* 86*  --  101 103  CO2 27  --   --  39* 35*  --  23 24  GLUCOSE 238* 231*  --  106* 100*  --  89 76  BUN 50* 49*  --  49* 44*  --  13  11  CREATININE 5.60* 5.30*  --  3.84* 2.93*  --  0.78 0.91  CALCIUM  6.7*  --   --  6.4* 6.6*  --  8.1* 8.0*  MG  --   --  2.3  --  2.4 2.1 1.9 1.7  PHOS  --   --  4.8*  --   --   --   --   --    Liver Function Tests: Recent Labs  Lab 12/10/23 2004 12/11/23 0518 12/13/23 0533 12/14/23 0353  AST 98* 80* 49* 53*  ALT 96* 79* 46* 54*  ALKPHOS 164* 135* 134* 137*  BILITOT 1.0 1.0 0.8 0.7  PROT 9.1* 6.9 6.3* 6.8  ALBUMIN 4.4 3.5 3.0* 3.2*   Recent Labs  Lab 12/10/23 2048  LIPASE 45   No results for input(s): AMMONIA in the last 168 hours. CBC: Recent Labs  Lab 12/10/23 2004 12/10/23 2005 12/11/23 0518 12/13/23 0533 12/14/23 0353   WBC 10.3  --  9.1 5.8 5.7  NEUTROABS 6.7  --   --  3.9 4.0  HGB 13.6 16.0* 10.7* 9.5* 10.3*  HCT 43.0 47.0* 34.1* 29.7* 32.6*  MCV 75.2*  --  75.1* 74.4* 75.1*  PLT 324  --  223 175 194   Cardiac Enzymes: Recent Labs  Lab 12/10/23 2048  CKTOTAL 260*   BNP: Invalid input(s): POCBNP CBG: Recent Labs  Lab 12/10/23 2000  GLUCAP 251*   D-Dimer No results for input(s): DDIMER in the last 72 hours. Hgb A1c No results for input(s): HGBA1C in the last 72 hours. Lipid Profile No results for input(s): CHOL, HDL, LDLCALC, TRIG, CHOLHDL, LDLDIRECT in the last 72 hours. Thyroid  function studies No results for input(s): TSH, T4TOTAL, T3FREE, THYROIDAB in the last 72 hours.  Invalid input(s): FREET3 Anemia work up No results for input(s): VITAMINB12, FOLATE, FERRITIN, TIBC, IRON, RETICCTPCT in the last 72 hours. Urinalysis    Component Value Date/Time   COLORURINE YELLOW 12/11/2023 0454   APPEARANCEUR HAZY (A) 12/11/2023 0454   LABSPEC 1.011 12/11/2023 0454   PHURINE 9.0 (H) 12/11/2023 0454   GLUCOSEU 50 (A) 12/11/2023 0454   HGBUR SMALL (A) 12/11/2023 0454   BILIRUBINUR NEGATIVE 12/11/2023 0454   KETONESUR NEGATIVE 12/11/2023 0454   PROTEINUR NEGATIVE 12/11/2023 0454   UROBILINOGEN 0.2 11/01/2013 1246   NITRITE NEGATIVE 12/11/2023 0454   LEUKOCYTESUR NEGATIVE 12/11/2023 0454   Sepsis Labs Recent Labs  Lab 12/10/23 2004 12/11/23 0518 12/13/23 0533 12/14/23 0353  WBC 10.3 9.1 5.8 5.7   Microbiology Recent Results (from the past 240 hours)  Blood Culture (routine x 2)     Status: None (Preliminary result)   Collection Time: 12/10/23  8:22 PM   Specimen: BLOOD RIGHT HAND  Result Value Ref Range Status   Specimen Description BLOOD RIGHT HAND  Final   Special Requests   Final    BOTTLES DRAWN AEROBIC AND ANAEROBIC Blood Culture adequate volume   Culture   Final    NO GROWTH 4 DAYS Performed at [facility], [address], [place], [place] [address]    Report Status PENDING  Incomplete  Resp panel by RT-PCR (RSV, Flu A&B, Covid) Anterior Nasal Swab     Status: None   Collection Time: 12/10/23  8:48 PM   Specimen: Anterior Nasal Swab  Result Value Ref Range Status   SARS Coronavirus 2 by RT PCR NEGATIVE NEGATIVE Final   Influenza A by PCR NEGATIVE NEGATIVE Final   Influenza B by PCR  NEGATIVE NEGATIVE Final    Comment: (NOTE) The Xpert Xpress SARS-CoV-2/FLU/RSV plus assay is intended as an aid in the diagnosis of influenza from Nasopharyngeal swab specimens and should not be used as a sole basis for treatment. Nasal washings and aspirates are unacceptable for Xpert Xpress SARS-CoV-2/FLU/RSV testing.  Fact Sheet for Patients: BloggerCourse.com  Fact Sheet for Healthcare Providers: SeriousBroker.it  This test is not yet approved or cleared by the [place]  FDA and has been authorized for detection and/or diagnosis of SARS-CoV-2 by FDA under an Emergency Use Authorization (EUA). This EUA will remain in effect (meaning this test can be used) for the duration of the COVID-19 declaration under Section 564(b)(1) of the Act, 21 U.S.C. section 360bbb-3(b)(1), unless the authorization is terminated or revoked.     Resp Syncytial Virus by PCR NEGATIVE NEGATIVE Final    Comment: (NOTE) Fact Sheet for Patients: BloggerCourse.com  Fact Sheet for Healthcare Providers: SeriousBroker.it  This test is not yet approved or cleared by the [place]  FDA and has been authorized for detection and/or diagnosis of SARS-CoV-2 by FDA under an Emergency Use Authorization (EUA). This EUA will remain in effect (meaning this test can be used) for the duration of the COVID-19 declaration under Section 564(b)(1) of the Act, 21 U.S.C. section 360bbb-3(b)(1), unless the authorization is terminated  or revoked.  Performed at [facility], [address] [place], [place] [address]   Blood Culture (routine x 2)     Status: None (Preliminary result)   Collection Time: 12/10/23  8:48 PM   Specimen: BLOOD  Result Value Ref Range Status   Specimen Description BLOOD RIGHT ANTECUBITAL  Final   Special Requests   Final    BOTTLES DRAWN AEROBIC AND ANAEROBIC Blood Culture adequate volume   Culture   Final    NO GROWTH 4 DAYS Performed at [facility], [address], [place], [place] [address]    Report Status PENDING  Incomplete     Time coordinating discharge: Over 30 minutes  SIGNED:   Brayton Lye, MD  [facility] 12/14/2023, 1:46 PM Pager   If 7PM-7AM, please contact night-coverage [email or web address]

## 2023-12-14 NOTE — TOC Transition Note (Signed)
 Transition of Care [facility]) - Discharge Note   Patient Details  Name: Chelsea Hansen MRN: [id number] Date of Birth: [birth year]  [facility] CM/SW Contact:  Hendricks KANDICE Her, RN Phone Number: 12/14/2023, 1:39 PM   Clinical Narrative:     Patient will dc to her Daughter's house today. [address] [place] [address]  [place].  Patient has been recommended a tub bench and a rolling walker- RNCM has placed order with [employer] for equipment to go to bedside prior to patient leaving. No additional TOC needs           Patient Goals and CMS Choice            Discharge Placement                       Discharge Plan and Services Additional resources added to the After Visit Summary for                                       Social Drivers of Health (SDOH) Interventions SDOH Screenings   Food Insecurity: No Food Insecurity (12/11/2023)  Recent Concern: Food Insecurity - Food Insecurity Present (11/24/2023)  Housing: Low Risk  (12/11/2023)  Transportation Needs: No Transportation Needs (12/11/2023)  Utilities: Not At Risk (12/11/2023)  Depression (PHQ2-9): High Risk (11/30/2023)  Financial Resource Strain: High Risk (01/14/2022)  Physical Activity: Insufficiently Active (08/01/2023)  Stress: Stress Concern Present (10/10/2023)  Tobacco Use: Low Risk  (12/10/2023)     Readmission Risk Interventions    12/07/2023   12:21 PM 11/26/2023    2:58 PM  Readmission Risk Prevention Plan  Transportation Screening Complete Complete  PCP or Specialist Appt within 3-5 Days  Complete  HRI or Home Care Consult  Complete  Social Work Consult for Recovery Care Planning/Counseling  Complete  Palliative Care Screening  Not Applicable  Medication Review Oceanographer) Complete Complete  PCP or Specialist appointment within 3-5 days of discharge Complete   HRI or Home Care Consult Complete   SW Recovery Care/Counseling Consult Complete   Palliative Care Screening Not  Applicable   Skilled Nursing Facility Not Applicable

## 2023-12-14 NOTE — Progress Notes (Incomplete)
 PROGRESS NOTE                                                                                                                                                                                                             Patient Demographics:    Chelsea Hansen, is a 53 y.o. female, DOB - 11-14-1970, FMW:[id number]  Outpatient Primary MD for the patient is [name], NP    LOS - 4  Admit date - 12/10/2023    Chief Complaint  Patient presents with   Code Stroke       Brief Narrative (HPI from H&P)    53 y.o. female with medical history significant of CKD stage IIIa, history of recurrent hospital admission for intractable nausea vomiting with associated electrolyte derangement, supraventricular tachycardia, asthma, essential hypertension, hyperlipidemia, chronic pulmonary nodule, cancer related pain and metastatic breast cancer status post neoadjuvant chemotherapy, lumpectomy, SNL biopsy status post radiation currently on anastrozole  and abemaciclib , recent issues with recurrent nausea vomiting requiring admissions.   Presented to emergency department accompanied by EMS with concern for stroke.  Patient son reported last known well 11 AM 6/21.  EMS reported aphasia worsening throughout transport and left-sided facial droop/asymmetry.  She also has continued nausea vomiting at home, further workup in the ER rule out stroke but confirmed severe hypocalcemia likely causing tetany and facial asymmetry from muscle spasm.  She was seen by neurology, GI and admitted to the hospital.   Subjective:   Patient in bed, appears comfortable, denies any headache, no fever, no chest pain or pressure, no shortness of breath , no abdominal pain. No new focal weakness.   Assessment  & Plan :   Hypocalcemia -Significant nausea and vomiting, requiring multiple admissions to the hospital. 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 - Repeat EGD in 2 to 3 months to assess healing of esophagitis - Patient continues to have nausea and vomiting then may consider  dilation of duodenal stenosis.   Recurrent nausea vomiting with severe dehydration, AKI, hypokalemia, hypocalcemia, hypomagnesemia.  GI on board, CT scan abdomen pelvis noted with possible mucosal lesion in the hepatic flexure, due for EGD on 12/13/2023, clear liquids and supportive care for now, replace electrolytes including calcium , magnesium  and potassium and continue IV fluids for hydration.  History of metastatic breast cancer.  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  Case discussed with oncologist Dr. Godino who request chest CT noncontrast for staging has been obtained suggestive of multiple nodules/malignancy cannot be ruled out, Dr. Valere wants to make sure that patient's home medication Verzinio has been stopped permanently upon discharge.  Chronic cancer patient with narcotic bowel.  Supportive care, bowel regimen continued.  Hx of hypertension, SVT.  Pressure is improved low-dose beta-blocker.        Condition -  Guarded  Family Communication  :  None present  Code Status :  Full  Consults  :  GI, Onc, neurology  PUD Prophylaxis :  PPI   Procedures  :     CT chest  noncontrast.  1. No acute cardiopulmonary abnormalities. No significant change from 11/23/2023. 2. No chest wall mass or focal fluid collections identified. 3. Diffuse sclerotic bone metastases are identified within the bony thorax. These are similar in distribution and size compared with the previous examination from 11/23/2023. 4. Multiple tiny lung nodules within upper lung zone predominance are similar in size and multiplicity when compared with 11/23/2023. These nodules are nonspecific and may be infectious or inflammatory in etiology. Metastatic disease is not excluded. 5. Post treatment changes identified within the right breast with central surgical clips and overlying skin thickening  CT abdomen pelvis- 1. Diffuse osteoblastic changes consistent with metastatic disease. 2. Possible colonic mucosal lesion in the hepatic flexure. This can be better evaluated endoscopically. 3. Diverticulosis      Disposition Plan  :    Status is: Inpatient  DVT Prophylaxis  :    Place and maintain sequential compression device Start: 12/11/23 0708 SCDs Start: 12/10/23 2306 Place TED hose Start: 12/10/23 2306    Lab Results  Component Value Date   PLT 194 12/14/2023    Diet :  Diet Order             DIET SOFT Room service appropriate? Yes; Fluid consistency: Thin  Diet effective now                    Inpatient Medications  Scheduled Meds:  anastrozole   1 mg Oral Daily   calcitRIOL  0.5 mcg Oral Daily   methylnaltrexone  6 mg Subcutaneous Q48H   pantoprazole  (PROTONIX ) IV  40 mg Intravenous Q12H   polyethylene glycol  17 g Oral Daily   potassium chloride   40 mEq Oral Q6H   senna-docusate  1 tablet Oral BID   thiamine (VITAMIN B1) injection  100 mg Intravenous Daily   Vitamin D (Ergocalciferol)  50,000 Units Oral Q7 days   Continuous Infusions:  fluconazole  (DIFLUCAN ) IV 200 mg (12/14/23 1032)   PRN Meds:.acetaminophen , LORazepam , metoprolol  tartrate, oxyCODONE -acetaminophen ,  prochlorperazine , trimethobenzamide    Objective:   Vitals:   12/13/23 1926 12/13/23 2334 12/14/23 0628 12/14/23 0811  BP: 130/83 124/74 111/74 116/74  Pulse: 67 77  70  Resp: 18 18 16    Temp: 99.1 F (37.3  C) 99 F (37.2 C) 98.5 F (36.9 C) 98.3 F (36.8 C)  TempSrc: Oral Oral Oral Oral  SpO2: 97% 97%  98%  Weight:        Wt Readings from Last 3 Encounters:  12/10/23 81.4 kg  12/05/23 85.5 kg  12/01/23 85.5 kg    No intake or output data in the 24 hours ending 12/14/23 1144    Physical Exam  Awake Alert, No new F.N deficits, Normal affect [place].AT,PERRAL Supple Neck, No JVD,   Symmetrical Chest wall movement, Good air movement bilaterally, CTAB RRR,No Gallops,Rubs or new Murmurs,  +ve B.Sounds, Abd Soft, No tenderness,   No Cyanosis, Clubbing or edema        Data Review:    Recent Labs  Lab 12/10/23 2004 12/10/23 2005 12/11/23 0518 12/13/23 0533 12/14/23 0353  WBC 10.3  --  9.1 5.8 5.7  HGB 13.6 16.0* 10.7* 9.5* 10.3*  HCT 43.0 47.0* 34.1* 29.7* 32.6*  PLT 324  --  223 175 194  MCV 75.2*  --  75.1* 74.4* 75.1*  MCH 23.8*  --  23.6* 23.8* 23.7*  MCHC 31.6  --  31.4 32.0 31.6  RDW 14.2  --  14.2 14.1 14.3  LYMPHSABS 2.7  --   --  1.0 1.0  MONOABS 0.7  --   --  0.6 0.5  EOSABS 0.0  --   --  0.1 0.1  BASOSABS 0.0  --   --  0.1 0.0    Recent Labs  Lab 12/10/23 2004 12/10/23 2005 12/10/23 2048 12/11/23 0045 12/11/23 0052 12/11/23 0518 12/11/23 0818 12/13/23 0533 12/14/23 0353  NA 137 132*  --  135  --  135  --  134* 135  K 3.5 3.3*  --  3.0*  --  3.0*  --  3.3* 3.3*  CL 73* 76*  --  81*  --  86*  --  101 103  CO2 27  --   --  39*  --  35*  --  23 24  ANIONGAP 37*  --   --  15  --  14  --  10 8  GLUCOSE 238* 231*  --  106*  --  100*  --  89 76  BUN 50* 49*  --  49*  --  44*  --  13 11  CREATININE 5.60* 5.30*  --  3.84*  --  2.93*  --  0.78 0.91  AST 98*  --   --   --   --  80*  --  49* 53*  ALT 96*  --   --   --   --  79*  --  46* 54*   ALKPHOS 164*  --   --   --   --  135*  --  134* 137*  BILITOT 1.0  --   --   --   --  1.0  --  0.8 0.7  ALBUMIN 4.4  --   --   --   --  3.5  --  3.0* 3.2*  LATICACIDVEN  --   --   --  1.6 1.6  --   --   --   --   INR 1.1  --   --   --   --   --   --   --   --   MG  --   --  2.3  --   --  2.4 2.1 1.9 1.7  PHOS  --   --  4.8*  --   --   --   --   --   --   CALCIUM  6.7*  --   --  6.4*  --  6.6*  --  8.1* 8.0*      Recent Labs  Lab 12/10/23 2004 12/10/23 2048 12/11/23 0045 12/11/23 0052 12/11/23 0518 12/11/23 0818 12/13/23 0533 12/14/23 0353  LATICACIDVEN  --   --  1.6 1.6  --   --   --   --   INR 1.1  --   --   --   --   --   --   --   MG  --  2.3  --   --  2.4 2.1 1.9 1.7  CALCIUM  6.7*  --  6.4*  --  6.6*  --  8.1* 8.0*    --------------------------------------------------------------------------------------------------------------- Lab Results  Component Value Date   CHOL 226 (H) 10/06/2020   HDL 51 10/06/2020   LDLCALC 140 (H) 10/06/2020   TRIG 196 (H) 10/06/2020   CHOLHDL 4.4 10/06/2020    No results found for: HGBA1C No results for input(s): TSH, T4TOTAL, FREET4, T3FREE, THYROIDAB in the last 72 hours. No results for input(s): VITAMINB12, FOLATE, FERRITIN, TIBC, IRON, RETICCTPCT in the last 72 hours. ------------------------------------------------------------------------------------------------------------------ Cardiac Enzymes No results for input(s): CKMB, TROPONINI, MYOGLOBIN in the last 168 hours.  Invalid input(s): CK  Micro Results Recent Results (from the past 240 hours)  Blood Culture (routine x 2)     Status: None (Preliminary result)   Collection Time: 12/10/23  8:22 PM   Specimen: BLOOD RIGHT HAND  Result Value Ref Range Status   Specimen Description BLOOD RIGHT HAND  Final   Special Requests   Final    BOTTLES DRAWN AEROBIC AND ANAEROBIC Blood Culture adequate volume   Culture   Final    NO GROWTH 4  DAYS Performed at [facility], [address], [place], [place] [address]    Report Status PENDING  Incomplete  Resp panel by RT-PCR (RSV, Flu A&B, Covid) Anterior Nasal Swab     Status: None   Collection Time: 12/10/23  8:48 PM   Specimen: Anterior Nasal Swab  Result Value Ref Range Status   SARS Coronavirus 2 by RT PCR NEGATIVE NEGATIVE Final   Influenza A by PCR NEGATIVE NEGATIVE Final   Influenza B by PCR NEGATIVE NEGATIVE Final    Comment: (NOTE) The Xpert Xpress SARS-CoV-2/FLU/RSV plus assay is intended as an aid in the diagnosis of influenza from Nasopharyngeal swab specimens and should not be used as a sole basis for treatment. Nasal washings and aspirates are unacceptable for Xpert Xpress SARS-CoV-2/FLU/RSV testing.  Fact Sheet for Patients: BloggerCourse.com  Fact Sheet for Healthcare Providers: SeriousBroker.it  This test is not yet approved or cleared by the [place]  FDA and has been authorized for detection and/or diagnosis of SARS-CoV-2 by FDA under an Emergency Use Authorization (EUA). This EUA will remain in effect (meaning this test can be used) for the duration of the COVID-19 declaration under Section 564(b)(1) of the Act, 21 U.S.C. section 360bbb-3(b)(1), unless the authorization is terminated or revoked.     Resp Syncytial Virus by PCR NEGATIVE NEGATIVE Final    Comment: (NOTE) Fact Sheet for Patients: BloggerCourse.com  Fact Sheet for Healthcare Providers: SeriousBroker.it  This test is not yet approved or cleared by the [place]  FDA and has been authorized for detection and/or diagnosis of SARS-CoV-2 by FDA under an Emergency Use Authorization (EUA). This EUA  will remain in effect (meaning this test can be used) for the duration of the COVID-19 declaration under Section 564(b)(1) of the Act, 21 U.S.C. section 360bbb-3(b)(1), unless the  authorization is terminated or revoked.  Performed at [facility], [address], [place], [place] [address]   Blood Culture (routine x 2)     Status: None (Preliminary result)   Collection Time: 12/10/23  8:48 PM   Specimen: BLOOD  Result Value Ref Range Status   Specimen Description BLOOD RIGHT ANTECUBITAL  Final   Special Requests   Final    BOTTLES DRAWN AEROBIC AND ANAEROBIC Blood Culture adequate volume   Culture   Final    NO GROWTH 4 DAYS Performed at [name], [address] [place], [place] [address]    Report Status PENDING  Incomplete    Radiology Report CT CHEST WO CONTRAST Result Date: 12/13/2023 CLINICAL DATA:  Chest wall pain nontraumatic. Inflammation or infection suspected. Metastatic breast cancer. * Tracking Code: BO * EXAM: CT CHEST WITHOUT CONTRAST TECHNIQUE: Multidetector CT imaging of the chest was performed following the standard protocol without IV contrast. RADIATION DOSE REDUCTION: This exam was performed according to the departmental dose-optimization program which includes automated exposure control, adjustment of the mA and/or kV according to patient size and/or use of iterative reconstruction technique. COMPARISON:  PET-CT 11/24/2023 FINDINGS: Cardiovascular: Heart size appears normal.  No pericardial effusion. Mediastinum/Nodes: No axillary, supraclavicular, mediastinal or hilar adenopathy. Thyroid  gland, trachea and esophagus demonstrate no significant findings. Lungs/Pleura: No pleural effusion. No pneumothorax, airspace consolidation or atelectasis. As noted previously there are multiple tiny lung nodules within upper lung zone predominance which have a nonspecific appearance These nodules are similar in size and multiplicity when compared with 11/23/2023. Reference nodule in the anterior right upper lobe measures 2 mm, image 46/4. Unchanged from previous exam. Reference nodule within the anteromedial right middle lobe measures 4 mm, image  76/4. Also unchanged. Upper Abdomen: No acute abnormality. Exophytic cyst off the upper pole of right kidney is unchanged measuring 9 mm. No follow-up imaging recommended. Musculoskeletal: Diffuse sclerotic bone metastases are identified within the bony thorax. These are similar in distribution and size compared with the previous examination from 11/23/2023. No chest wall mass. Post treatment changes identified within the right breast with central surgical clips and overlying skin thickening. No chest wall mass or focal fluid collections identified. IMPRESSION: 1. No acute cardiopulmonary abnormalities. No significant change from 11/23/2023. 2. No chest wall mass or focal fluid collections identified. 3. Diffuse sclerotic bone metastases are identified within the bony thorax. These are similar in distribution and size compared with the previous examination from 11/23/2023. 4. Multiple tiny lung nodules within upper lung zone predominance are similar in size and multiplicity when compared with 11/23/2023. These nodules are nonspecific and may be infectious or inflammatory in etiology. Metastatic disease is not excluded. 5. Post treatment changes identified within the right breast with central surgical clips and overlying skin thickening. Electronically Signed   By: Waddell Calk M.D.   On: 12/13/2023 04:56     Signature  -   Brayton Lye M.D on 12/14/2023 at 11:44 AM   -  To page go to [email or web address]

## 2023-12-15 ENCOUNTER — Encounter (HOSPITAL_COMMUNITY): Payer: Self-pay

## 2023-12-15 ENCOUNTER — Encounter: Payer: Self-pay | Admitting: Adult Health

## 2023-12-15 ENCOUNTER — Other Ambulatory Visit: Payer: Self-pay | Admitting: Hematology and Oncology

## 2023-12-15 ENCOUNTER — Other Ambulatory Visit: Payer: Self-pay | Admitting: Pharmacist

## 2023-12-15 ENCOUNTER — Other Ambulatory Visit (HOSPITAL_COMMUNITY): Payer: Self-pay

## 2023-12-15 ENCOUNTER — Inpatient Hospital Stay (HOSPITAL_BASED_OUTPATIENT_CLINIC_OR_DEPARTMENT_OTHER): Payer: MEDICAID | Admitting: Adult Health

## 2023-12-15 ENCOUNTER — Inpatient Hospital Stay: Payer: MEDICAID

## 2023-12-15 ENCOUNTER — Inpatient Hospital Stay (HOSPITAL_BASED_OUTPATIENT_CLINIC_OR_DEPARTMENT_OTHER): Payer: MEDICAID | Admitting: Nurse Practitioner

## 2023-12-15 ENCOUNTER — Telehealth: Payer: Self-pay | Admitting: *Deleted

## 2023-12-15 ENCOUNTER — Encounter: Payer: Self-pay | Admitting: Nurse Practitioner

## 2023-12-15 VITALS — BP 97/64 | HR 73 | Temp 98.2°F | Resp 17 | Wt 181.4 lb

## 2023-12-15 DIAGNOSIS — R530 Neoplastic (malignant) related fatigue: Secondary | ICD-10-CM

## 2023-12-15 DIAGNOSIS — K5900 Constipation, unspecified: Secondary | ICD-10-CM | POA: Diagnosis not present

## 2023-12-15 DIAGNOSIS — Z515 Encounter for palliative care: Secondary | ICD-10-CM | POA: Diagnosis not present

## 2023-12-15 DIAGNOSIS — K5903 Drug induced constipation: Secondary | ICD-10-CM | POA: Diagnosis not present

## 2023-12-15 DIAGNOSIS — C50411 Malignant neoplasm of upper-outer quadrant of right female breast: Secondary | ICD-10-CM | POA: Diagnosis present

## 2023-12-15 DIAGNOSIS — Z79811 Long term (current) use of aromatase inhibitors: Secondary | ICD-10-CM | POA: Diagnosis not present

## 2023-12-15 DIAGNOSIS — C7951 Secondary malignant neoplasm of bone: Secondary | ICD-10-CM

## 2023-12-15 DIAGNOSIS — Z170 Estrogen receptor positive status [ER+]: Secondary | ICD-10-CM

## 2023-12-15 DIAGNOSIS — E876 Hypokalemia: Secondary | ICD-10-CM | POA: Diagnosis not present

## 2023-12-15 DIAGNOSIS — G893 Neoplasm related pain (acute) (chronic): Secondary | ICD-10-CM

## 2023-12-15 DIAGNOSIS — R634 Abnormal weight loss: Secondary | ICD-10-CM | POA: Diagnosis not present

## 2023-12-15 DIAGNOSIS — R112 Nausea with vomiting, unspecified: Secondary | ICD-10-CM | POA: Diagnosis not present

## 2023-12-15 DIAGNOSIS — C50412 Malignant neoplasm of upper-outer quadrant of left female breast: Secondary | ICD-10-CM | POA: Diagnosis present

## 2023-12-15 DIAGNOSIS — C50911 Malignant neoplasm of unspecified site of right female breast: Secondary | ICD-10-CM

## 2023-12-15 LAB — CMP (CANCER CENTER ONLY)
ALT: 60 U/L — ABNORMAL HIGH (ref 0–44)
AST: 49 U/L — ABNORMAL HIGH (ref 15–41)
Albumin: 4.3 g/dL (ref 3.5–5.0)
Alkaline Phosphatase: 200 U/L — ABNORMAL HIGH (ref 38–126)
Anion gap: 11 (ref 5–15)
BUN: 13 mg/dL (ref 6–20)
CO2: 26 mmol/L (ref 22–32)
Calcium: 8.7 mg/dL — ABNORMAL LOW (ref 8.9–10.3)
Chloride: 102 mmol/L (ref 98–111)
Creatinine: 0.99 mg/dL (ref 0.44–1.00)
GFR, Estimated: >60 mL/min (ref >60)
Glucose, Bld: 96 mg/dL (ref 70–99)
Potassium: 3.1 mmol/L — ABNORMAL LOW (ref 3.5–5.1)
Sodium: 139 mmol/L (ref 135–145)
Total Bilirubin: 0.4 mg/dL (ref 0.0–1.2)
Total Protein: 8.1 g/dL (ref 6.5–8.1)

## 2023-12-15 LAB — CBC WITH DIFFERENTIAL (CANCER CENTER ONLY)
Abs Immature Granulocytes: 0.22 10*3/uL — ABNORMAL HIGH (ref 0.00–0.07)
Basophils Absolute: 0 10*3/uL (ref 0.0–0.1)
Basophils Relative: 1 %
Eosinophils Absolute: 0.1 10*3/uL (ref 0.0–0.5)
Eosinophils Relative: 1 %
HCT: 33.9 % — ABNORMAL LOW (ref 36.0–46.0)
Hemoglobin: 10.9 g/dL — ABNORMAL LOW (ref 12.0–15.0)
Immature Granulocytes: 4 %
Lymphocytes Relative: 16 %
Lymphs Abs: 0.9 10*3/uL (ref 0.7–4.0)
MCH: 23.4 pg — ABNORMAL LOW (ref 26.0–34.0)
MCHC: 32.2 g/dL (ref 30.0–36.0)
MCV: 72.9 fL — ABNORMAL LOW (ref 80.0–100.0)
Monocytes Absolute: 0.4 10*3/uL (ref 0.1–1.0)
Monocytes Relative: 7 %
Neutro Abs: 3.9 10*3/uL (ref 1.7–7.7)
Neutrophils Relative %: 71 %
Platelet Count: 233 10*3/uL (ref 150–400)
RBC: 4.65 MIL/uL (ref 3.87–5.11)
RDW: 14.4 % (ref 11.5–15.5)
WBC Count: 5.5 10*3/uL (ref 4.0–10.5)
nRBC: 0 % (ref 0.0–0.2)

## 2023-12-15 LAB — CULTURE, BLOOD (ROUTINE X 2)
Culture: NO GROWTH
Culture: NO GROWTH
Special Requests: ADEQUATE
Special Requests: ADEQUATE

## 2023-12-15 LAB — SURGICAL PATHOLOGY

## 2023-12-15 MED ORDER — SODIUM CHLORIDE 0.9 % IV SOLN: Freq: Once | INTRAVENOUS | Status: DC

## 2023-12-15 MED ORDER — POTASSIUM CHLORIDE IN NACL 20-0.9 MEQ/L-% IV SOLN
INTRAVENOUS | Status: DC
  Filled 2023-12-15: qty 1000

## 2023-12-15 MED ORDER — POTASSIUM CHLORIDE 10 MEQ/100ML IV SOLN: 10.0000 meq | INTRAVENOUS | Status: DC

## 2023-12-15 NOTE — Assessment & Plan Note (Addendum)
 12/03/2020: Bilateral breast biopsies: Right breast: T2N1 stage IIa grade 2 ILC, ER/PR positive, HER2 negative, Ki-67 40%, right axillary lymph node positive with extracapsular extension MammaPrint: High risk 01/27/2021 -06/08/2021 neoadjuvant chemotherapy with dose dense Adriamycin  and Cytoxan  x4 followed by Taxol  x11 METASTATIC DISEASE Right chest wall pain: CT chest 05/13/2022: Multifocal bone metastases thoracic spine and scattered rib lesions, no nodal or visceral metastases.  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 11/21/2022: CT CAP: Bone mets unchanged, 5 mm right lobe of the liver: Stable sigmoid colon: Mild diverticulitis 11/21/2022: Bone scan: Mild activity at L3 vertebral body compression fracture, stable 12/15/2022: MRI lumbar spine: Stable pathologic fracture L3 stable other bone mets 06/10/2023: PET/CT: Interval development of hypermetabolic lymphadenopathy left submandibular region, throughout mediastinal, bilateral hilar, upper abdomen (no uptake in the bones) 07/06/2023: Submandibular lymph node biopsy: Benign  ----------------------------------------------------------------------------------------------------------------------------- Current treatment: VerzEnio  (started 05/21/2022) with anastrozole --Verzenio  on hold beginning 12/05/2023 Verzinio dose: 50 p.o. twice daily (HOLDING) Stage 4 breast cancer Restaging PET and CT chest demonstrate stable disease.  She will continue on anastrozole  alone for now while she is recovering from her  hospitalization.  Constipation Improved with MiraLAX .  Had small bowel movement yesterday - Continue MiraLAX  as recommended by GI.  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  Continues on PPI twice daily.  Has follow-up with GI in 4 weeks, may need sooner appointment.   - Continue PPI, fluconazole , and supplements ordered at discharge.  Hypokalemia Potassium 3.1 today with slightly decreased BP.   - IV potassium 20 meq today - NS 1L IV today  RTC in 1 week for lab and f/u.

## 2023-12-15 NOTE — Progress Notes (Signed)
 [facility] Cancer Follow up:    [name], NP [address] [place] [address]   DIAGNOSIS:  Cancer Staging  Breast cancer [facility]) Staging form: Breast, AJCC 8th Edition - Clinical stage from 12/17/2020: Stage IA (cT1b, cN0, cM0, G2, ER+, PR+, HER2-) - Signed by [name], NP on 01/14/2022 Stage prefix: Initial diagnosis Histologic grading system: 3 grade system Laterality: Left Staged by: Pathologist and managing physician Stage used in treatment planning: Yes National guidelines used in treatment planning: Yes Type of national guideline used in treatment planning: NCCN - Pathologic stage from 07/09/2021: ypTis (DCIS), ypN0, cM0 - Signed by [name], NP on 07/22/2021 Stage prefix: Post-therapy  Malignant neoplasm of upper-outer quadrant of right breast in female, estrogen receptor positive (HCC) Staging form: Breast, AJCC 8th Edition - Clinical stage from 12/17/2020: Stage IIA (cT2, cN1, cM0, G2, ER+, PR+, HER2-) - Signed by [name], NP on 01/14/2022 Stage prefix: Initial diagnosis Histologic grading system: 3 grade system Laterality: Right Staged by: Pathologist and managing physician Stage used in treatment planning: Yes National guidelines used in treatment planning: Yes Type of national guideline used in treatment planning: NCCN - Pathologic stage from 07/09/2021: ypT2, ypN1a, cM0, G2 - Signed by [name], NP on 07/22/2021 Stage prefix: Post-therapy Histologic grading system: 3 grade system - Pathologic: Stage IV (cM1) - Signed by [name], NP on 12/07/2022    SUMMARY OF ONCOLOGIC HISTORY: Oncology History  Malignant neoplasm of upper-outer quadrant of right breast in female, estrogen receptor positive (HCC)  12/12/2020 Initial Diagnosis   status post bilateral breast biopsies 12/03/2020, showing             (1) on the right, a clinical T2 N1, stage IIa invasive lobular  carcinoma, grade 2, estrogen and progesterone receptor strongly positive, HER2 not amplified, with an MIB-1 of 40%                         (a) the biopsied right axillary lymph node was positive with extracapsular extension                         (b) a second right breast mass also biopsied was a fibroadenoma, concordant  MAMMAPRINT tested on biopsy returned high risk, luminal type B, indicating significant benefit from chemo                         (c) biopsy of an area of non-mass-like enhancement in the upper right breast pending   12/17/2020 Cancer Staging   Staging form: Breast, AJCC 8th Edition - Clinical stage from 12/17/2020: Stage IIA (cT2, cN1, cM0, G2, ER+, PR+, HER2-) - Signed by [name], NP on 01/14/2022 Stage prefix: Initial diagnosis Histologic grading system: 3 grade system Laterality: Right Staged by: Pathologist and managing physician Stage used in treatment planning: Yes National guidelines used in treatment planning: Yes Type of national guideline used in treatment planning: NCCN   12/24/2020 Genetic Testing   Negative genetic testing:  No pathogenic variants detected on the [employer] BRCAplus panel (report date 12/24/2020) or the CancerNext-Expanded + RNAinsight panel (report date 12/31/2020). 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   The BRCAplus panel offered by [employer] and includes sequencing and deletion/duplication analysis for the following 8 genes: ATM, BRCA1, BRCA2, CDH1, CHEK2, PALB2, PTEN,  and TP53. The CancerNext-Expanded + RNAinsight gene panel offered by [employer] and includes sequencing and rearrangement analysis for the following 77 genes: AIP, ALK, APC, ATM, AXIN2, BAP1, BARD1, BLM, BMPR1A, BRCA1, BRCA2, BRIP1, CDC73, CDH1, CDK4, CDKN1B, CDKN2A, CHEK2, CTNNA1, DICER1, FANCC, FH, FLCN, GALNT12, KIF1B, LZTR1, MAX, MEN1, MET, MLH1, MSH2, MSH3, MSH6, MUTYH, NBN, NF1, NF2, NTHL1, PALB2,  PHOX2B, PMS2, POT1, PRKAR1A, PTCH1, PTEN, RAD51C, RAD51D, RB1, RECQL, RET, SDHA, SDHAF2, SDHB, SDHC, SDHD, SMAD4, SMARCA4, SMARCB1, SMARCE1, STK11, SUFU, TMEM127, TP53, TSC1, TSC2, VHL and XRCC2 (sequencing and deletion/duplication); EGFR, EGLN1, HOXB13, KIT, MITF, PDGFRA, POLD1 and POLE (sequencing only); EPCAM and GREM1 (deletion/duplication only). RNA data is routinely analyzed for use in variant interpretation for all genes.   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 Multifocal sclerotic bony metastatic disease greatest in the thoracic spine with scattered rib lesions and sclerosis as well. 2. No nodal or visceral metastasis about the chest. 3. Collateral pathways about the chest suggest some subclavian venous narrowing on the LEFT. This could also be related to arm position. Correlate clinically. 4. Signs of RIGHT breast lumpectomy. Potential postoperative changes in the LEFT breast with small focal area of nodularity in the lateral LEFT breast  which could also be postoperative, correlate clinically and consider mammographic correlation as warranted. 5. Mild cardiac enlargement.   05/18/2022 Treatment Plan Change   Anastrozole  + Verzenio  100mg  PO BID   12/07/2022 Cancer Staging   Staging form: Breast, AJCC 8th Edition - Pathologic: Stage IV (cM1) - Signed by [name], NP on 12/07/2022   Breast cancer (HCC)  12/12/2020 Initial Diagnosis   on the left, a clinical T1b N0, stage IA invasive ductal carcinoma, grade 1 or 2, estrogen and progesterone receptor positive, HER2 not amplified, with an MIB 1 of 15%   12/17/2020 Cancer Staging   Staging form: Breast, AJCC 8th Edition - Clinical stage from 12/17/2020: Stage IA (cT1b, cN0, cM0, G2, ER+, PR+, HER2-) - Signed by [name], NP on 01/14/2022 Stage prefix: Initial diagnosis Histologic grading system: 3 grade system Laterality: Left Staged by: Pathologist and managing physician Stage used in treatment planning: Yes National guidelines used in treatment planning: Yes Type of national guideline used in treatment planning: NCCN   12/24/2020 Genetic Testing   Negative genetic testing:  No pathogenic variants detected on the [employer] BRCAplus panel (report date 12/24/2020) or the CancerNext-Expanded + RNAinsight panel (report date 12/31/2020). 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   The BRCAplus panel offered by [employer] and  includes sequencing and deletion/duplication analysis for the following 8 genes: ATM, BRCA1, BRCA2, CDH1, CHEK2, PALB2, PTEN, and TP53. The CancerNext-Expanded + RNAinsight gene panel offered by [employer] and includes sequencing and rearrangement analysis for the following 77 genes: AIP, ALK, APC, ATM, AXIN2, BAP1, BARD1, BLM, BMPR1A, BRCA1, BRCA2, BRIP1, CDC73, CDH1, CDK4, CDKN1B, CDKN2A, CHEK2, CTNNA1, DICER1, FANCC, FH, FLCN, GALNT12, KIF1B, LZTR1, MAX, MEN1, MET, MLH1, MSH2,  MSH3, MSH6, MUTYH, NBN, NF1, NF2, NTHL1, PALB2, PHOX2B, PMS2, POT1, PRKAR1A, PTCH1, PTEN, RAD51C, RAD51D, RB1, RECQL, RET, SDHA, SDHAF2, SDHB, SDHC, SDHD, SMAD4, SMARCA4, SMARCB1, SMARCE1, STK11, SUFU, TMEM127, TP53, TSC1, TSC2, VHL and XRCC2 (sequencing and deletion/duplication); EGFR, EGLN1, HOXB13, KIT, MITF, PDGFRA, POLD1 and POLE (sequencing only); EPCAM and GREM1 (deletion/duplication only). RNA data is routinely analyzed for use in variant interpretation for all genes.   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 Multifocal sclerotic bony metastatic disease greatest in the thoracic spine with scattered rib lesions and sclerosis as well. 2. No nodal or visceral metastasis about the chest. 3. Collateral pathways about the chest suggest some subclavian venous narrowing on the LEFT. This could also be related to arm position. Correlate clinically. 4. Signs of RIGHT breast lumpectomy. Potential postoperative changes in the LEFT breast with small focal area of nodularity in the lateral LEFT  breast which could also be postoperative, correlate clinically and consider mammographic correlation as warranted. 5. Mild cardiac enlargement.   05/18/2022 Treatment Plan Change   Anastrozole  + Verzenio  100mg  PO BID   06/23/2022 - 07/07/2022 Radiation Therapy   06/23/2022 through 07/07/2022 Site Technique Total Dose (Gy) Dose per Fx (Gy) Completed Fx Beam Energies  Lumbar Spine: Spine_LS_Pelv 3D 30/30 3 10/10 15X       CURRENT THERAPY:  INTERVAL HISTORY:   Chelsea Hansen 53 y.o. female returns for follow-up of her metastatic breast cancer.  She has been to the emergency room 4 times since the beginning of June and was admitted 3 times.  Each episode was for nausea and vomiting.  Initially it was thought to be constipation from her opioids however she began having bowel movements once on her bowel regimen and then her Verzenio  was held however that was not the cause. She underwent upper endoscopy with Dr. Federico on 12/13/2023 that demonstrated candida esophagitis, esophageal stenosis, hiatal hernia, gastritis, duodenitis, and acquired duodenal stenosis.  Biopsies were obtained and the patient was continued on a proton pump inhibitor twice a day along with fluconazole  200 mg p.o. twice daily for 12 days.  She is taking her medications as prescribed and she is continued on anastrozole .  I  confirm that she discontinued the Verzenio  since December 05, 2023.  During her hospitalization she underwent repeat imaging with CT chest on December 13, 2023 that demonstrated continued bone metastases similar to prior multiple lung nodules similar to prior.  There was no overt evidence of progression on the CT chest on December 13, 2023.  She has continued on anastrozole  daily.  She notes that she just got out of the hospital yesterday.  She has gained 2 pounds and is excited about her weight gain.  She has had occasional nausea.  She has been refraining from taking her pain medication due to the changes it can cause on the  bowels.  She has follow-up with palliative care after her appointment today.  Her potassium is decreased at 3.1 and she struggles with taking the oral pills due to their size.  Her calcium  is improved today to 8.7.  She has struggled with drinking enough fluid and is working on her second large glass of water today.    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   Abnormal CT scan, colon 12/14/2023   Acute esophagitis 12/14/2023   Candida esophagitis (HCC) 12/14/2023   Therapeutic opioid-induced constipation (OIC) 12/11/2023   Hypocalcemia 12/10/2023   Fever of unknown origin 12/10/2023   Sinus tachycardia 12/10/2023   Acute kidney injury superimposed on chronic kidney disease (HCC) 12/05/2023   Electrolyte abnormality 12/05/2023   Dehydration 12/05/2023   QT prolongation 12/05/2023   Intractable nausea and vomiting 11/24/2023   Neoplasm related pain 12/07/2022   Constipation 06/30/2022   Nausea and vomiting in adult 06/29/2022   Hematemesis 06/28/2022   Breast cancer metastasized to bone, right (HCC) 05/20/2022   Supraventricular tachycardia (HCC) 07/08/2021   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   GERD (gastroesophageal reflux disease) 05/27/2021   HLD (hyperlipidemia) 05/27/2021   Essential hypertension 05/27/2021   Pain management contract signed 05/04/2021   Chronic pain syndrome (breast cancer) 05/04/2021   Palpitations 03/25/2021   Sinus bradycardia 03/25/2021   Daytime somnolence 03/25/2021   Snoring 03/25/2021   Obesity (BMI 30-39.9) 03/25/2021   Port-A-Cath in place 01/27/2021   Genetic testing 12/24/2020   Family history of uterine cancer 12/17/2020   Malignant neoplasm of upper-outer quadrant of right breast in female, estrogen receptor positive (HCC) 12/12/2020   Breast cancer (HCC) 12/12/2020   Hypokalemia 01/02/2012   Chronic asthma 01/01/2012   Bradycardia 01/01/2012   Syncope 12/31/2011   Mediastinal  adenopathy 12/31/2011   Anemia 12/31/2011   Chest pain 12/31/2011    has no known allergies.  MEDICAL HISTORY: Past Medical History:  Diagnosis Date   Allergy    seasonal allergies   Anxiety    on meds   Asthma    uses inhaler   Breast cancer (HCC) 2012   RIGHT lumpectomy   Cancer (HCC) 2022   RIGHT breast lump-dx 2022   Depression    on meds   DVT (deep venous thrombosis) (HCC) 2010   after hysterectomy   Family history of uterine cancer    GERD (gastroesophageal reflux disease)    with certain foods/OTC PRN meds   Headache(784.0)    History of radiation therapy    Bilateral breast- 09/03/21-11/02/21- Dr. Lynwood Nasuti   Hyperlipidemia    on meds   Hypertension    on meds   SVT (supraventricular tachycardia) (HCC)     SURGICAL HISTORY: Past Surgical History:  Procedure Laterality Date   ABDOMINAL HYSTERECTOMY  AXILLARY SENTINEL NODE BIOPSY Left 07/09/2021   Procedure: LEFT AXILLARY SENTINEL NODE BIOPSY;  Surgeon: [name], MD;  Location: [facility];  Service: General;  Laterality: Left;   BIOPSY  06/29/2022   Procedure: BIOPSY;  Surgeon: Legrand Victory LITTIE DOUGLAS, MD;  Location: [facility];  Service: Gastroenterology;;   BREAST EXCISIONAL BIOPSY Right 09/16/2009   BREAST LUMPECTOMY WITH RADIOACTIVE SEED LOCALIZATION Bilateral 07/09/2021   Procedure: BILATERAL BREAST LUMPECTOMY WITH RADIOACTIVE SEED LOCALIZATION;  Surgeon: [name], MD;  Location: [facility];  Service: General;  Laterality: Bilateral;   BREAST SURGERY     lumpectomy   ESOPHAGOGASTRODUODENOSCOPY (EGD) WITH PROPOFOL  N/A 06/29/2022   Procedure: ESOPHAGOGASTRODUODENOSCOPY (EGD) WITH PROPOFOL ;  Surgeon: Legrand Victory LITTIE DOUGLAS, MD;  Location: [facility];  Service: Gastroenterology;  Laterality: N/A;   PORT-A-CATH REMOVAL Left 07/09/2021   Procedure: REMOVAL PORT-A-CATH;  Surgeon: [name], MD;  Location: [facility];  Service: General;   Laterality: Left;   PORTACATH PLACEMENT Left 01/26/2021   Procedure: INSERTION PORT-A-CATH;  Surgeon: [name], MD;  Location: [facility];  Service: General;  Laterality: Left;   RADIOACTIVE SEED GUIDED AXILLARY SENTINEL LYMPH NODE Right 07/09/2021   Procedure: RADIOACTIVE SEED GUIDED RIGHT AXILLARY SENTINEL LYMPH NODE DISSECTION;  Surgeon: [name], MD;  Location: [facility];  Service: General;  Laterality: Right;   TUBAL LIGATION      SOCIAL HISTORY: Social History   Socioeconomic History   Marital status: Single    Spouse name: Not on file   Number of children: Not on file   Years of education: Not on file   Highest education level: Not on file  Occupational History   Not on file  Tobacco Use   Smoking status: Never   Smokeless tobacco: Never  Vaping Use   Vaping status: Never Used  Substance and Sexual Activity   Alcohol use: Not Currently    Alcohol/week: 7.0 standard drinks of alcohol    Types: 7 Standard drinks or equivalent per week    Comment: occassional   Drug use: No   Sexual activity: Not Currently  Other Topics Concern   Not on file  Social History Narrative   Not on file   Social Drivers of Health   Financial Resource Strain: High Risk (01/14/2022)   Overall Financial Resource Strain (CARDIA)    Difficulty of Paying Living Expenses: Very hard  Food Insecurity: No Food Insecurity (12/11/2023)   Hunger Vital Sign    Worried About Running Out of Food in the Last Year: Never true    Ran Out of Food in the Last Year: Never true  Recent Concern: Food Insecurity - Food Insecurity Present (11/24/2023)   Hunger Vital Sign    Worried About Running Out of Food in the Last Year: Sometimes true    Ran Out of Food in the Last Year: Sometimes true  Transportation Needs: No Transportation Needs (12/11/2023)   PRAPARE - Administrator, Civil Service (Medical): No    Lack of Transportation (Non-Medical): No  Physical Activity:  Insufficiently Active (08/01/2023)   Exercise Vital Sign    Days of Exercise per Week: 1 day    Minutes of Exercise per Session: 10 min  Stress: Stress Concern Present (10/10/2023)   [employer] of Occupational Health - Occupational Stress Questionnaire    Feeling of Stress : Rather much  Social Connections: Not on file  Intimate Partner Violence: Not At Risk (12/11/2023)   Humiliation,  Afraid, Rape, and Kick questionnaire    Fear of Current or Ex-Partner: No    Emotionally Abused: No    Physically Abused: No    Sexually Abused: No    FAMILY HISTORY: Family History  Problem Relation Age of Onset   Breast cancer Mother        7s   Hypertension Father    Uterine cancer Maternal Aunt    Ovarian cancer Maternal Aunt    Breast cancer Maternal Aunt        28s   Breast cancer Cousin 12   Uterine cancer Cousin 61   Colon polyps Neg Hx    Colon cancer Neg Hx    Esophageal cancer Neg Hx    Stomach cancer Neg Hx     Review of Systems  Constitutional:  Positive for fatigue. Negative for appetite change, chills, fever and unexpected weight change.  HENT:   Negative for hearing loss, lump/mass and trouble swallowing.   Eyes:  Negative for eye problems and icterus.  Respiratory:  Negative for chest tightness, cough and shortness of breath.   Cardiovascular:  Negative for chest pain, leg swelling and palpitations.  Gastrointestinal:  Positive for nausea. Negative for abdominal distention, abdominal pain, constipation, diarrhea and vomiting.  Endocrine: Negative for hot flashes.  Genitourinary:  Negative for difficulty urinating.   Musculoskeletal:  Negative for arthralgias.  Skin:  Negative for itching and rash.  Neurological:  Negative for dizziness, extremity weakness, headaches and numbness.  Hematological:  Negative for adenopathy. Does not bruise/bleed easily.  Psychiatric/Behavioral:  Negative for depression. The patient is not nervous/anxious.       PHYSICAL  EXAMINATION    Vitals:   12/15/23 1257  BP: 97/64  Pulse: 73  Resp: 17  Temp: 98.2 F (36.8 C)  SpO2: 100%    Physical Exam Constitutional:      General: She is not in acute distress.    Appearance: Normal appearance. She is not toxic-appearing.  HENT:     Head: Normocephalic and atraumatic.     Mouth/Throat:     Mouth: Mucous membranes are moist.     Pharynx: Oropharynx is clear. No oropharyngeal exudate or posterior oropharyngeal erythema.   Eyes:     General: No scleral icterus.   Cardiovascular:     Rate and Rhythm: Normal rate and regular rhythm.     Pulses: Normal pulses.     Heart sounds: Normal heart sounds.  Pulmonary:     Effort: Pulmonary effort is normal.     Breath sounds: Normal breath sounds.  Abdominal:     General: Abdomen is flat. Bowel sounds are normal. There is no distension.     Palpations: Abdomen is soft.     Tenderness: There is no abdominal tenderness.   Musculoskeletal:        General: No swelling.     Cervical back: Neck supple.  Lymphadenopathy:     Cervical: No cervical adenopathy.   Skin:    General: Skin is warm and dry.     Findings: No rash.   Neurological:     General: No focal deficit present.     Mental Status: She is alert.   Psychiatric:        Mood and Affect: Mood normal.        Behavior: Behavior normal.     LABORATORY DATA:  CBC    Component Value Date/Time   WBC 5.5 12/15/2023 1147   WBC 5.7 12/14/2023 0353   RBC  4.65 12/15/2023 1147   HGB 10.9 (L) 12/15/2023 1147   HGB 11.5 10/06/2020 1432   HCT 33.9 (L) 12/15/2023 1147   HCT 36.8 10/06/2020 1432   PLT 233 12/15/2023 1147   PLT 323 10/06/2020 1432   MCV 72.9 (L) 12/15/2023 1147   MCV 73 (L) 10/06/2020 1432   MCH 23.4 (L) 12/15/2023 1147   MCHC 32.2 12/15/2023 1147   RDW 14.4 12/15/2023 1147   RDW 15.9 (H) 10/06/2020 1432   LYMPHSABS 0.9 12/15/2023 1147   LYMPHSABS 1.9 10/06/2020 1432   MONOABS 0.4 12/15/2023 1147   EOSABS 0.1 12/15/2023  1147   EOSABS 0.1 10/06/2020 1432   BASOSABS 0.0 12/15/2023 1147   BASOSABS 0.0 10/06/2020 1432    CMP     Component Value Date/Time   NA 139 12/15/2023 1147   NA 144 11/30/2023 1000   K 3.1 (L) 12/15/2023 1147   CL 102 12/15/2023 1147   CO2 26 12/15/2023 1147   GLUCOSE 96 12/15/2023 1147   BUN 13 12/15/2023 1147   BUN 19 11/30/2023 1000   CREATININE 0.99 12/15/2023 1147   CALCIUM  8.7 (L) 12/15/2023 1147   PROT 8.1 12/15/2023 1147   PROT 7.3 11/30/2023 1000   ALBUMIN 4.3 12/15/2023 1147   ALBUMIN 4.5 11/30/2023 1000   AST 49 (H) 12/15/2023 1147   ALT 60 (H) 12/15/2023 1147   ALKPHOS 200 (H) 12/15/2023 1147   BILITOT 0.4 12/15/2023 1147   GFRNONAA >60 12/15/2023 1147   GFRAA >60 09/07/2019 1340     ASSESSMENT and THERAPY PLAN:   Malignant neoplasm of upper-outer quadrant of right breast in female, estrogen receptor positive (HCC) 12/03/2020: Bilateral breast biopsies: Right breast: T2N1 stage IIa grade 2 ILC, ER/PR positive, HER2 negative, Ki-67 40%, right axillary lymph node positive with extracapsular extension MammaPrint: High risk 01/27/2021 -06/08/2021 neoadjuvant chemotherapy with dose dense Adriamycin  and Cytoxan  x4 followed by Taxol  x11 METASTATIC DISEASE Right chest wall pain: CT chest 05/13/2022: Multifocal bone metastases thoracic spine and scattered rib lesions, no nodal or visceral metastases.  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 11/21/2022: CT CAP: Bone mets unchanged, 5 mm right lobe of the liver: Stable sigmoid colon: Mild  diverticulitis 11/21/2022: Bone scan: Mild activity at L3 vertebral body compression fracture, stable 12/15/2022: MRI lumbar spine: Stable pathologic fracture L3 stable other bone mets 06/10/2023: PET/CT: Interval development of hypermetabolic lymphadenopathy left submandibular region, throughout mediastinal, bilateral hilar, upper abdomen (no uptake in the bones) 07/06/2023: Submandibular lymph node biopsy: Benign  ----------------------------------------------------------------------------------------------------------------------------- Current treatment: VerzEnio  (started 05/21/2022) with anastrozole --Verzenio  on hold beginning 12/05/2023 Verzinio dose: 50 p.o. twice daily (HOLDING) Stage 4 breast cancer Restaging PET and CT chest demonstrate stable disease.  She will continue on anastrozole  alone for now while she is recovering from her hospitalization.  Constipation Improved with MiraLAX .  Had small bowel movement yesterday - Continue MiraLAX  as recommended by GI.  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  Continues on PPI twice daily.  Has follow-up with GI in 4 weeks, may need sooner appointment.   - Continue PPI, fluconazole , and supplements ordered at  discharge.  Hypokalemia Potassium 3.1 today with slightly decreased BP.   - IV potassium 20 meq today - NS 1L IV today  RTC in 1 week for lab and f/u.    I reviewed the above plan with Dr. [name] who is in agreement with it.    All questions were answered. The patient knows to call the clinic with any problems, questions or concerns. We can certainly see the patient much sooner if necessary.  Total encounter time:45 minutes*in face-to-face visit time, chart review, lab review, care coordination, order entry, and documentation of the encounter time.    Morna Kendall, NP 12/15/23 3:41 PM Medical Oncology and Hematology [facility] [address] [place], [place] [address] Tel.  [phone]    Fax. [phone]  *Total Encounter Time as defined by the Centers for Medicare and Medicaid Services includes, in addition to the face-to-face time of a patient visit (documented in the note above) non-face-to-face time: obtaining and reviewing outside history, ordering and reviewing medications, tests or procedures, care coordination (communications with other health care professionals or caregivers) and documentation in the medical record.

## 2023-12-15 NOTE — Patient Instructions (Signed)
 Rehydration, Adult Rehydration is the replacement of fluids, salts, and minerals in the body (electrolytes) that are lost during dehydration. Dehydration is when there is not enough water or other fluids in the body. This happens when you lose more fluids than you take in. Common causes of dehydration include: Not drinking enough fluids. This can occur when you are ill or doing activities that require a lot of energy, especially in hot weather. Conditions that cause loss of water or other fluids. These include diarrhea, vomiting, sweating, and urinating a lot. Other illnesses, such as fever or infection. Certain medicines, such as those that remove excess fluid from the body (diuretics). Symptoms of mild or moderate dehydration may include thirst, dry lips and mouth, and dizziness. Symptoms of severe dehydration may include increased heart rate, confusion, fainting, and not urinating. In severe cases, you may need to get fluids through an IV at the hospital. For mild or moderate cases, you can usually rehydrate at home by drinking certain fluids as told by your health care provider. What are the risks? Your health care provider will talk with you about risks. Your health care provider will talk with you about risks. This may include taking in too much fluid (overhydration). This is rare. Overhydration can cause an imbalance of electrolytes in the body, kidney failure, or a decrease in salt (sodium) levels in the body. Supplies needed: You will need an oral rehydration solution (ORS) if your health care provider tells you to use one. This is a drink to treat dehydration. It can be found in pharmacies and retail stores. How to rehydrate Fluids Follow instructions from your health care provider about what to drink. The kind of fluid and the amount you should drink depend on your condition. In general, you should choose drinks that you prefer. If told by your health care provider, drink an ORS. Make an  ORS by following instructions on the package. Start by drinking small amounts, about  cup (120 mL) every 5-10 minutes. Slowly increase how much you drink until you have taken in the amount recommended by your health care provider. Drink enough clear fluids to keep your urine pale yellow. If you were told to drink an ORS, finish it first, then start slowly drinking other clear fluids. Drink fluids such as: Water. This includes sparkling and flavored water. Drinking only water can lead to having too little sodium in your body (hyponatremia). Follow the advice of your health care provider. Water from ice chips you suck on. Fruit juice with water added to it (diluted). Sports drinks. Hot or cold herbal teas. Broth-based soups. Milk or milk products. Food Follow instructions from your health care provider about what to eat while you rehydrate. Your health care provider may recommend that you slowly begin eating regular foods in small amounts. Eat foods that contain a healthy balance of electrolytes, such as bananas, oranges, potatoes, tomatoes, and spinach. Avoid foods that are greasy or contain a lot of sugar. In some cases, you may get nutrition through a feeding tube that is passed through your nose and into your stomach (nasogastric tube, or NG tube). This may be done if you have uncontrolled vomiting or diarrhea. Drinks to avoid  Certain drinks may make dehydration worse. While you rehydrate, avoid drinking alcohol. How to tell if you are recovering from dehydration You may be getting better if: You are urinating more often than before you started rehydrating. Your urine is pale yellow. Your energy level improves. You vomit less  often. You have diarrhea less often. Your appetite improves or returns to normal. You feel less dizzy or light-headed. Your skin tone and color start to look more normal. Follow these instructions at home: Take over-the-counter and prescription medicines only  as told by your health care provider. Do not take sodium tablets. Doing this can lead to having too much sodium in your body (hypernatremia). Contact a health care provider if: You continue to have symptoms of mild or moderate dehydration, such as: Thirst. Dry lips. Slightly dry mouth. Dizziness. Dark urine or less urine than normal. Muscle cramps. You continue to vomit or have diarrhea. Get help right away if: You have symptoms of dehydration that get worse. You have a fever. You have a severe headache. You have been vomiting and have problems, such as: Your vomiting gets worse or does not go away. Your vomit includes blood or green matter (bile). You cannot eat or drink without vomiting. You have problems with urination or bowel movements, such as: Diarrhea that gets worse or does not go away. Blood in your stool (feces). This may cause stool to look black and tarry. Not urinating, or urinating only a small amount of very dark urine, within 6-8 hours. You have trouble breathing. You have symptoms that get worse with treatment. These symptoms may be an emergency. Get help right away. Call 911. Do not wait to see if the symptoms will go away. Do not drive yourself to the hospital. This information is not intended to replace advice given to you by your health care provider. Make sure you discuss any questions you have with your health care provider. Document Revised: 10/19/2021 Document Reviewed: 10/19/2021 Elsevier Patient Education  2024 [employer].

## 2023-12-15 NOTE — Transitions of Care (Post Inpatient/ED Visit) (Signed)
   12/15/2023  Name: SCARLET ABAD MRN: [id number] DOB: 08/04/1970  Today's TOC FU Call Status: Today's TOC FU Call Status:: Unsuccessful Call (1st Attempt) Unsuccessful Call (1st Attempt) Date: 12/15/23  Attempted to reach the patient regarding the most recent Inpatient/ED visit.  Follow Up Plan: Additional outreach attempts will be made to reach the patient to complete the Transitions of Care (Post Inpatient/ED visit) call.   [name] RN, BSN [place]  [facility] RN Care Manager [phone]

## 2023-12-15 NOTE — Progress Notes (Signed)
 Palliative Medicine [facility]  [phone] [phone]   Name: Chelsea Hansen Date: 12/15/2023 MRN: [id number]  DOB: Jan 04, 1971  Patient Care Team: Chelsea Rosaline SQUIBB, NP as PCP - General (Internal Medicine) Chelsea Pugh, DO as PCP - Cardiology (Cardiology) Chelsea Agent, MD as Consulting Physician (Radiation Oncology) Chelsea Berg, MD as Consulting Physician (General Surgery) Chelsea Potts, MD as Consulting Physician (Hematology and Oncology) Chelsea Hansen, Chelsea SAILOR, NP as Nurse Practitioner (Nurse Practitioner)   INTERVAL HISTORY: Chelsea Hansen is a 53 y.o. 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  MRI of L-spine (12/23) showed L3 pathologic fracture and significant lesion along S1 with sclerotic changes.  Palliative ask to see for symptom management and goals of care.   SOCIAL HISTORY:     reports that she has never smoked. She has never used smokeless tobacco. She reports that she does not currently use alcohol after a past usage of about 7.0 standard drinks of alcohol per week. She reports that she does not use drugs.  ADVANCE DIRECTIVES:   CODE STATUS:   PAST MEDICAL HISTORY: Past Medical History:  Diagnosis Date   Allergy    seasonal allergies   Anxiety    on meds   Asthma    uses inhaler   Breast cancer (HCC) 2012   RIGHT lumpectomy   Cancer (HCC) 2022   RIGHT breast lump-dx 2022   Depression    on meds   DVT (deep venous thrombosis) (HCC) 2010   after hysterectomy   Family history of uterine cancer    GERD (gastroesophageal reflux disease)    with certain foods/OTC PRN meds   Headache(784.0)     History of radiation therapy    Bilateral breast- 09/03/21-11/02/21- Chelsea Hansen Chelsea   Hyperlipidemia    on meds   Hypertension    on meds   SVT (supraventricular tachycardia) (HCC)     ALLERGIES:  has no known allergies.  MEDICATIONS:  Current Outpatient Medications  Medication Sig Dispense Refill   albuterol  (VENTOLIN  HFA) 108 (90 Base) MCG/ACT inhaler Inhale 2 puffs by mouth into the lungs every 6 hours as needed for wheezing or shortness of breath. 18 g 2   anastrozole  (ARIMIDEX ) 1 MG tablet Take 1 tablet (1 mg total) by mouth daily. (Patient not taking: Reported on 12/13/2023) 90 tablet 3   calcitRIOL (ROCALTROL) 0.5 MCG capsule Take 1 capsule (0.5 mcg total) by mouth daily. 30 capsule 0   Calcium  Carb-Cholecalciferol  600-10 MG-MCG TABS Take 1 tablet by mouth 3 (three) times daily. 90 tablet 0   fluconazole  (DIFLUCAN ) 100 MG tablet Take 2 tablets (200 mg total) by mouth daily for 12 days. 24 tablet 0   gabapentin  (NEURONTIN ) 300 MG capsule Take 1 capsule (300 mg total) by mouth 3 (three) times daily. (Patient not taking: No sig reported) 90 capsule 6   montelukast  (SINGULAIR ) 10 MG tablet Take 1 tablet (10 mg total) by mouth at bedtime. (Patient taking differently: Take 10 mg by mouth at bedtime as needed (for asthma flares or allergic symptoms).) 90 tablet 3   oxyCODONE -acetaminophen  (PERCOCET) 7.5-325 MG tablet Take 1-2 tablets by mouth every 6 (six) hours as needed for severe pain (pain score 7-10). 102 tablet 0   pantoprazole  (  PROTONIX ) 40 MG tablet Take 1 tablet (40 mg total) by mouth 2 (two) times daily. 60 tablet 0   polyethylene glycol powder (GLYCOLAX /MIRALAX ) 17 GM/SCOOP powder Mix 17 g in 4 oz of liquid and take by mouth 2 (two) times daily as needed. 3350 g 1   prochlorperazine  (COMPAZINE ) 10 MG tablet Take 1 tablet (10 mg total) by mouth every 6 (six) hours as needed for nausea or vomiting. 30 tablet 1   [Paused] propranolol  (INDERAL ) 10 MG tablet Take 1 tablet (10 mg  total) by mouth 2 (two) times daily. Please call [phone] to schedule an appointment for future refills. Thank you. 3rd attempt. (Patient not taking: Reported on 12/13/2023) 30 tablet 0   scopolamine  (TRANSDERM-SCOP) 1 MG/3DAYS Place 1 patch (1.5 mg total) onto the skin every 3 (three) days as needed (Dizziness, nausea or vomiting). 10 patch 12   senna (SENOKOT) 8.6 MG TABS tablet Take 2 tablets (17.2 mg total) by mouth at bedtime. (Patient not taking: No sig reported) 60 tablet 3   sennosides-docusate sodium  (SENOKOT-S) 8.6-50 MG tablet Take 1 tablet by mouth daily. (Patient not taking: No sig reported) 120 tablet 1   No current facility-administered medications for this visit.   Facility-Administered Medications Ordered in Other Visits  Medication Dose Route Frequency Provider Last Rate Last Admin   0.9 % NaCl with KCl 20 mEq/ L  infusion   Intravenous Continuous [name], [name], MD 40 mL/hr at 12/15/23 1425 New Bag at 12/15/23 1425    VITAL SIGNS: There were no vitals taken for this visit. There were no vitals filed for this visit.   Estimated body mass index is 25.3 kg/m as calculated from the following:   Height as of 12/05/23: 5' 11 (1.803 m).   Weight as of an earlier encounter on 12/15/23: 181 lb 6.4 oz (82.3 kg).   PERFORMANCE STATUS (ECOG) : 1 - Symptomatic but completely ambulatory  Assessment NAD, weak  RRR Normal breathing pattern   IMPRESSION: [name] was seen during her [facility] visit today for follow-up. Patient recently discharged from hospital on yesterday after presenting with facial droop and slurred speech. She was seen by Neurology and GI during hospitalization.  Workup showed facial tetany from severe hypocalcemia.  CVA ruled out.  EGD identified significant large grade C esophagitis.  Patient treated with fluconazole  for Candida with recommendations for repeat EGD in 2-3 months.  CT scan showed diffuse sclerotic metastasis.  Chelsea Hansen reports since discharge she  continues to feel poorly.  Nausea and vomiting persistent overnight causing difficulty with taking oral medications, fluids, or nutrition.  Some improvement today.  Patient receiving IV potassium due to potassium of 3.1.  Potassium 3.3 prior to discharge on 6/25.  Of note potassium was discontinued at discharge.  Encourage patient to take antiemetics, slowly sip on drinks to maintain hydration, consider brat diet until nausea vomiting subsides or improve.  She verbalized understanding.  Patient reports occasional constipation over the past several days.  Previously bowel regimen well-controlled with MiraLAX  and senna S.  Emphasized the importance of bowel regimen to prevent constipation and further gastric complications.  Patient recommended to take MiraLAX  twice daily with 2 senna tablets twice daily.  Ms. Balon states her pain is well-controlled on current regimen.  Regimen includes oxycodone  7.5/325 mg 1-2 tablets every 6 hours as needed for breakthrough pain.  Hospitalist discontinued OxyContin  10 mg at discharge.  No adjustments to current regimen at this time.  We will continue to closely follow and support.  All questions answered. Pain contract on file.  We will continue to closely monitor.  All questions answered and support provided.  PLAN: OxyContin  10mg  every 12 hours discontinued at discharge. Continue Percocet every 6 hours as needed for breakthrough pain. Have been taking more often due to absence of long-acting.  MiraLAX  twice daily for bowel regimen Senna 2 tablets twice daily Protonix  40 mg daily  Patient reports pain is well-controlled. I will plan to see patient back in 3-4 weeks in collaboration with her other oncology appointments. Patient knows to contact our office sooner if needed.  Patient expressed understanding and was in agreement with this plan. She also understands that She can call the clinic at any time with any questions, concerns, or complaints.     Any  controlled substances utilized were prescribed in the context of palliative care. PDMP has been reviewed.    Visit consisted of counseling and education dealing with the complex and emotionally intense issues of symptom management and palliative care in the setting of serious and potentially life-threatening illness.  Levon Borer, AGPCNP-BC  Palliative Medicine Team/[facility]

## 2023-12-15 NOTE — Discharge Summary (Signed)
 Physician Discharge Summary  Chelsea Hansen FMW:[id number] DOB: 1970-10-12 DOA: 12/10/2023  PCP: Chelsea Rosaline SQUIBB, NP  Admit date: 12/10/2023 Discharge date: 12/14/2023  Admitted From: (Home) Disposition:  (Home )  Recommendations for Outpatient Follow-up:  Follow up with PCP in 1-2 weeks Please obtain BMP/CBC in one week Please follow up on the following pending results:   Diet recommendation: Soft diet Brief/Interim Summary:   53 y.o. female with medical history significant of CKD stage IIIa, history of recurrent hospital admission for intractable nausea vomiting with associated electrolyte derangement, supraventricular tachycardia, asthma, essential hypertension, hyperlipidemia, chronic pulmonary nodule, cancer related pain and metastatic breast cancer status post neoadjuvant chemotherapy, lumpectomy, SNL biopsy status post radiation currently on anastrozole  and abemaciclib , recent issues with recurrent nausea vomiting requiring admissions.    Presented to emergency department accompanied by EMS with concern for stroke.  Patient son reported last known well 11 AM 6/21.  EMS reported aphasia worsening throughout transport and left-sided facial droop/asymmetry.  She also has continued nausea vomiting at home, further workup in the ER rule out stroke but confirmed severe hypocalcemia likely causing tetany and facial asymmetry from muscle spasm.  She was seen by neurology, GI and admitted to the hospital.  Hypocalcemia Vitamin D deficiency Secondary hyperparathyroidism -Significant nausea and vomiting, requiring multiple admissions to the hospital. 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  Finish total of 14 days. - Repeat EGD in 2 to 3 months to assess healing of esophagitis - Patient continues to have nausea and vomiting then may consider dilation of duodenal stenosis.  She has been tolerating her diet over last 24 hours.     Recurrent nausea vomiting with severe dehydration, AKI, hypokalemia, hypocalcemia, hypomagnesemia.  GI on board, CT scan abdomen pelvis noted with possible mucosal lesion in the hepatic flexure, please see above discussion regarding her endoscopy findings, she declined colonoscopy while inpatient, she is to follow with GI as an outpatient regarding repeat endoscopy and outpatient colonoscopy.   - Electrolyte has been replaced  History of metastatic breast cancer.  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  Case discussed with oncologist Dr. Godino who request chest CT noncontrast for staging has been obtained suggestive of multiple nodules/malignancy cannot be ruled out, Dr. Valere wants to make sure that patient's home medication Verzinio has been stopped permanently upon discharge.  Discussed with the patient she is aware, she has a follow-up with Dr. Godina tomorrow.   Chronic cancer patient with narcotic bowel.  Supportive care, bowel regimen continued.      Discharge Diagnoses:  Principal Problem:   Acute kidney injury superimposed on chronic kidney disease (HCC) Active Problems:   Hypocalcemia   Fever of unknown origin   Breast cancer (HCC)   Chronic  pain syndrome (breast cancer)   Supraventricular tachycardia (HCC)  HLD (hyperlipidemia)   Essential hypertension   Intractable nausea and vomiting   QT prolongation   Sinus tachycardia   Therapeutic opioid-induced constipation (OIC)   Duodenal stenosis   Abnormal CT scan, colon   Acute esophagitis   Candida esophagitis (HCC)    Discharge Instructions  Discharge Instructions     Diet - low sodium heart healthy   Complete by: As directed    Discharge instructions   Complete by: As directed    Follow with Primary MD Chelsea Rosaline SQUIBB, NP in 7 days   Get CBC, CMP, checked  by Primary MD next visit.    Activity: As tolerated with Full fall precautions use walker/cane & assistance as needed   Disposition Home    Diet: Heart Healthy/SOFT DIET   On your next visit with your primary care physician please Get Medicines reviewed and adjusted.   Please request your Prim.MD to go over all Hospital Tests and Procedure/Radiological results at the follow up, please get all Hospital records sent to your Prim MD by signing hospital release before you go home.   If you experience worsening of your admission symptoms, develop shortness of breath, life threatening emergency, suicidal or homicidal thoughts you must seek medical attention immediately by calling 911 or calling your MD immediately  if symptoms less severe.  You Must read complete instructions/literature along with all the possible adverse reactions/side effects for all the Medicines you take and that have been prescribed to you. Take any new Medicines after you have completely understood and accpet all the possible adverse reactions/side effects.   Do not drive, operating heavy machinery, perform activities at heights, swimming or participation in water activities or provide baby sitting services if your were admitted for syncope or siezures until you have seen by Primary MD or a Neurologist and advised to do so  again.  Do not drive when taking Pain medications.    Do not take more than prescribed Pain, Sleep and Anxiety Medications  Special Instructions: If you have smoked or chewed Tobacco  in the last 2 yrs please stop smoking, stop any regular Alcohol  and or any Recreational drug use.  Wear Seat belts while driving.   Please note  You were cared for by a hospitalist during your hospital stay. If you have any questions about your discharge medications or the care you received while you were in the hospital after you are discharged, you can call the unit and asked to speak with the hospitalist on call if the hospitalist that took care of you is not available. Once you are discharged, your primary care physician will handle any further medical issues. Please note that NO REFILLS for any discharge medications will be authorized once you are discharged, as it is imperative that you return to your primary care physician (or establish a relationship with a primary care physician if you do not have one) for your aftercare needs so that they can reassess your need for medications and monitor your lab values.   Increase activity slowly   Complete by: As directed       Allergies as of 12/14/2023   No Known Allergies      Medication List     PAUSE taking these medications    propranolol  10 MG tablet Wait to take this until your doctor or other care provider tells you to start again. Commonly known as: INDERAL  Take 1 tablet (10 mg total) by mouth 2 (two) times daily. Please call [phone]  to schedule an appointment for future refills. Thank you. 3rd attempt.       STOP taking these medications    abemaciclib  50 MG tablet Commonly known as: VERZENIO    OxyCONTIN  10 MG 12 hr tablet Generic drug: oxyCODONE    Oyster Shell Calcium  w/D 500-5 MG-MCG Tabs Replaced by: Calcium  Carb-Cholecalciferol  600-10 MG-MCG Tabs   potassium chloride  SA 20 MEQ tablet Commonly known as: KLOR-CON  M        TAKE these medications    anastrozole  1 MG tablet Commonly known as: ARIMIDEX  Take 1 tablet (1 mg total) by mouth daily.   calcitRIOL 0.5 MCG capsule Commonly known as: ROCALTROL Take 1 capsule (0.5 mcg total) by mouth daily.   Calcium  Carb-Cholecalciferol  600-10 MG-MCG Tabs Take 1 tablet by mouth 3 (three) times daily. Replaces: Allie Gutta Calcium  w/D 500-5 MG-MCG Tabs   fluconazole  100 MG tablet Commonly known as: Diflucan  Take 2 tablets (200 mg total) by mouth daily for 12 days.   gabapentin  300 MG capsule Commonly known as: NEURONTIN  Take 1 capsule (300 mg total) by mouth 3 (three) times daily.   montelukast  10 MG tablet Commonly known as: SINGULAIR  Take 1 tablet (10 mg total) by mouth at bedtime. What changed:  when to take this reasons to take this   oxyCODONE -acetaminophen  7.5-325 MG tablet Commonly known as: Percocet Take 1-2 tablets by mouth every 6 (six) hours as needed for severe pain (pain score 7-10).   pantoprazole  40 MG tablet Commonly known as: Protonix  Take 1 tablet (40 mg total) by mouth 2 (two) times daily.   polyethylene glycol powder 17 GM/SCOOP powder Commonly known as: GLYCOLAX /MIRALAX  Mix 17 g in 4 oz of liquid and take by mouth 2 (two) times daily as needed.   prochlorperazine  10 MG tablet Commonly known as: COMPAZINE  Take 1 tablet (10 mg total) by mouth every 6 (six) hours as needed for nausea or vomiting.   scopolamine  1 MG/3DAYS Commonly known as: TRANSDERM-SCOP Place 1 patch (1.5 mg total) onto the skin every 3 (three) days as needed (Dizziness, nausea or vomiting).   senna 8.6 MG Tabs tablet Commonly known as: SENOKOT Take 2 tablets (17.2 mg total) by mouth at bedtime.   Senna-Plus 8.6-50 MG tablet Generic drug: senna-docusate Take 1 tablet by mouth daily.   Ventolin  HFA 108 (90 Base) MCG/ACT inhaler Generic drug: albuterol  Inhale 2 puffs by mouth into the lungs every 6 hours as needed for wheezing or shortness of  breath.        No Known Allergies  Consultations: Gastroenterology Oncology Neurology   Procedures/Studies: CT CHEST WO CONTRAST Result Date: 12/13/2023 CLINICAL DATA:  Chest wall pain nontraumatic. Inflammation or infection suspected. Metastatic breast cancer. * Tracking Code: BO * EXAM: CT CHEST WITHOUT CONTRAST TECHNIQUE: Multidetector CT imaging of the chest was performed following the standard protocol without IV contrast. RADIATION DOSE REDUCTION: This exam was performed according to the departmental dose-optimization program which includes automated exposure control, adjustment of the mA and/or kV according to patient size and/or use of iterative reconstruction technique. COMPARISON:  PET-CT 11/24/2023 FINDINGS: Cardiovascular: Heart size appears normal.  No pericardial effusion. Mediastinum/Nodes: No axillary, supraclavicular, mediastinal or hilar adenopathy. Thyroid  gland, trachea and esophagus demonstrate no significant findings. Lungs/Pleura: No pleural effusion. No pneumothorax, airspace consolidation or atelectasis. As noted previously there are multiple tiny lung nodules within upper lung zone predominance which have a nonspecific appearance These nodules are similar in size and multiplicity when compared with 11/23/2023. Reference nodule in the anterior right  upper lobe measures 2 mm, image 46/4. Unchanged from previous exam. Reference nodule within the anteromedial right middle lobe measures 4 mm, image 76/4. Also unchanged. Upper Abdomen: No acute abnormality. Exophytic cyst off the upper pole of right kidney is unchanged measuring 9 mm. No follow-up imaging recommended. Musculoskeletal: Diffuse sclerotic bone metastases are identified within the bony thorax. These are similar in distribution and size compared with the previous examination from 11/23/2023. No chest wall mass. Post treatment changes identified within the right breast with central surgical clips and overlying skin  thickening. No chest wall mass or focal fluid collections identified. IMPRESSION: 1. No acute cardiopulmonary abnormalities. No significant change from 11/23/2023. 2. No chest wall mass or focal fluid collections identified. 3. Diffuse sclerotic bone metastases are identified within the bony thorax. These are similar in distribution and size compared with the previous examination from 11/23/2023. 4. Multiple tiny lung nodules within upper lung zone predominance are similar in size and multiplicity when compared with 11/23/2023. These nodules are nonspecific and may be infectious or inflammatory in etiology. Metastatic disease is not excluded. 5. Post treatment changes identified within the right breast with central surgical clips and overlying skin thickening. Electronically Signed   By: Waddell Calk M.D.   On: 12/13/2023 04:56   DG Abd Portable 1V Result Date: 12/12/2023 EXAM: 1 VIEW XRAY OF THE ABDOMEN SUPINE 12/12/2023 06:03:00 AM COMPARISON: 1 view abdomen 01/04/2024. CT of the abdomen and pelvis 12/10/2023 and 12/06/2023. CLINICAL HISTORY: Nausea and vomiting. FINDINGS: BOWEL: Nonobstructive bowel gas pattern. PERITONEUM AND SOFT TISSUES: No abnormal calcifications. BONES: No acute osseous abnormality. IMPRESSION: 1. No acute findings. Electronically signed by: Lonni Necessary MD 12/12/2023 06:21 AM EDT RP Workstation: [id number]   CT ABDOMEN PELVIS WO CONTRAST Result Date: 12/10/2023 CLINICAL DATA:  Abdominal pain, vomiting. EXAM: CT ABDOMEN AND PELVIS WITHOUT CONTRAST TECHNIQUE: Multidetector CT imaging of the abdomen and pelvis was performed following the standard protocol without IV contrast. RADIATION DOSE REDUCTION: This exam was performed according to the departmental dose-optimization program which includes automated exposure control, adjustment of the mA and/or kV according to patient size and/or use of iterative reconstruction technique. COMPARISON:  0/17/2025. FINDINGS: Lower chest: No  acute abnormality. No pleural or pericardial effusion. Hepatobiliary: No focal liver abnormality is seen. No gallstones, gallbladder wall thickening, or biliary dilatation. Pancreas: Unremarkable. No pancreatic ductal dilatation or surrounding inflammatory changes. Spleen: Normal in size without focal abnormality. Adrenals/Urinary Tract: Adrenal glands are unremarkable. Kidneys are normal, without renal calculi, focal lesion, or hydronephrosis. Bladder is unremarkable. Stomach/Bowel: No bowel dilatation to suggest obstruction. Diffuse colonic diverticulosis. There is mucosal thickening in the hepatic flexure and possible apple-core lesion measuring 2.5 cm. This can be better evaluated endoscopically. Unremarkable appearance of the appendix. Vascular/Lymphatic: No significant vascular findings are present. No enlarged abdominal or pelvic lymph nodes. Reproductive: Status post hysterectomy. No adnexal masses. Other: No abdominal wall hernia or abnormality. No abdominopelvic ascites. Musculoskeletal: Diffuse osteoblastic changes. Stable pathologic compression deformity L3. IMPRESSION: 1. Diffuse osteoblastic changes consistent with metastatic disease. 2. Possible colonic mucosal lesion in the hepatic flexure. This can be better evaluated endoscopically. 3. Diverticulosis. Electronically Signed   By: Fonda Field M.D.   On: 12/10/2023 22:22   DG Chest Port 1 View Result Date: 12/10/2023 CLINICAL DATA:  Questionable sepsis - evaluate for abnormality EXAM: PORTABLE CHEST - 1 VIEW COMPARISON:  12/05/2023. FINDINGS: Cardiac silhouette is unremarkable. No pneumothorax or pleural effusion. The lungs are clear. The visualized skeletal structures are unremarkable. IMPRESSION: No  acute cardiopulmonary process. Electronically Signed   By: Fonda Field M.D.   On: 12/10/2023 21:55   CT HEAD CODE STROKE WO CONTRAST Result Date: 12/10/2023 CLINICAL DATA:  Code stroke.  Acute neurologic deficit EXAM: CT HEAD WITHOUT  CONTRAST TECHNIQUE: Contiguous axial images were obtained from the base of the skull through the vertex without intravenous contrast. RADIATION DOSE REDUCTION: This exam was performed according to the departmental dose-optimization program which includes automated exposure control, adjustment of the mA and/or kV according to patient size and/or use of iterative reconstruction technique. COMPARISON:  12/05/2023 FINDINGS: Brain: There is no mass, hemorrhage or extra-axial collection. The size and configuration of the ventricles and extra-axial CSF spaces are normal. The brain parenchyma is normal, without evidence of acute or chronic infarction. Vascular: No abnormal hyperdensity of the major intracranial arteries or dural venous sinuses. No intracranial atherosclerosis. Skull: The visualized skull base, calvarium and extracranial soft tissues are normal. Sinuses/Orbits: No fluid levels or advanced mucosal thickening of the visualized paranasal sinuses. No mastoid or middle ear effusion. The orbits are normal. ASPECTS [facility] Stroke Program Early CT Score) - Ganglionic level infarction (caudate, lentiform nuclei, internal capsule, insula, M1-M3 cortex): 7 - Supraganglionic infarction (M4-M6 cortex): 3 Total score (0-10 with 10 being normal): 10 IMPRESSION: 1. No acute intracranial abnormality. 2. ASPECTS is 10. These results were communicated to Dr. Lola Jernigan at 8:11 pm on 12/10/2023 by text page via the [facility] messaging system. Electronically Signed   By: Franky Stanford M.D.   On: 12/10/2023 20:14   EEG adult Result Date: 12/06/2023 Shelton Arlin KIDD, MD     12/07/2023  9:22 AM Patient Name: Chelsea Hansen MRN: [id number] Epilepsy Attending: Arlin KIDD Shelton Referring Physician/Provider: Sherrill Alejandro Donovan, DO Date: 12/06/2023 Duration: 22.50 mins Patient history: 53yo F with seizure like activity. EEG to evaluate for seizure. Level of alertness: Awake, asleep AEDs during EEG study: GBP Technical aspects: This EEG  study was done with scalp electrodes positioned according to the 10-20 International system of electrode placement. Electrical activity was reviewed with band pass filter of 1-70Hz , sensitivity of 7 uV/mm, display speed of 7mm/sec with a 60Hz  notched filter applied as appropriate. EEG data were recorded continuously and digitally stored.  Video monitoring was available and reviewed as appropriate. Description: The posterior dominant rhythm consists of 9-10 Hz activity of moderate voltage (25-35 uV) seen predominantly in posterior head regions, symmetric and reactive to eye opening and eye closing. Sleep was characterized by vertex waves, sleep spindles (12 to 14 Hz), maximal frontocentral region. Physiologic photic driving was not seen during photic stimulation.  Hyperventilation was not performed.   IMPRESSION: This study is within normal limits. No seizures or epileptiform discharges were seen throughout the recording. A normal interictal EEG does not exclude the diagnosis of epilepsy. Priyanka KIDD Shelton   CT ABDOMEN PELVIS WO CONTRAST Result Date: 12/06/2023 CLINICAL DATA:  Abdominal pain, acute, nonlocalized Nausea and Vomiting Hx recent admission for similar symptoms 6/5 - 6/7 and improved with symptomatic treatment. Previous imaging with CT A/P on 6/4 with moderate stool burden but no acute findings; however PET on 6/5 did note stomach and proximal duodenal EXAM: CT ABDOMEN AND PELVIS WITHOUT CONTRAST TECHNIQUE: Multidetector CT imaging of the abdomen and pelvis was performed following the standard protocol without IV contrast. RADIATION DOSE REDUCTION: This exam was performed according to the departmental dose-optimization program which includes automated exposure control, adjustment of the mA and/or kV according to patient size and/or use of iterative  reconstruction technique. COMPARISON:  PET CT 11/24/2023 ,CT abdomen pelvis 11/23/2023 FINDINGS: Lower chest: Small hiatal hernia.  No acute abnormality.  Hepatobiliary: No focal lesion. No gallstones, gallbladder wall thickening, or pericholecystic fluid. No biliary dilatation. Pancreas: Vague fat stranding along the proximal pancreas. No main pancreatic ductal dilatation. Spleen: Normal in size without focal abnormality. Splenule is noted. Adrenals/Urinary Tract: No adrenal nodule bilaterally. No nephrolithiasis and no hydronephrosis. No definite contour-deforming renal mass. No ureterolithiasis or hydroureter. The urinary bladder is unremarkable. Stomach/Bowel: Stomach is within normal limits. No evidence of bowel wall thickening or dilatation. Colonic diverticulosis. Appendix appears normal. Vascular/Lymphatic: No abdominal aorta or iliac aneurysm. No abdominal, pelvic, or inguinal lymphadenopathy. Reproductive: Status post hysterectomy. No adnexal masses. Other: No intraperitoneal free fluid. No intraperitoneal free gas. No organized fluid collection. Musculoskeletal: No abdominal wall hernia or abnormality. Scattered appendicular and axial skeleton metastatic osseous lesions. No acute displaced fracture. Chronic compression fracture of the L3 level with likely underlying vertebral body hemangioma. Multilevel degenerative changes of the spine. IMPRESSION: 1. Vague fat stranding along the proximal pancreas. Recommend correlation with lipase levels. 2. Small hiatal hernia. 3. Colonic diverticulosis with no acute diverticulitis. 4. Scattered appendicular and axial sclerotic metastatic osseous lesions. Electronically Signed   By: Morgane  Naveau M.D.   On: 12/06/2023 00:47   DG CHEST PORT 1 VIEW Result Date: 12/05/2023 CLINICAL DATA:  SOB EXAM: PORTABLE CHEST 1 VIEW COMPARISON:  01/28/2021. FINDINGS: The heart size and mediastinal contours are within normal limits. Both lungs are clear. The visualized skeletal structures are unremarkable. IMPRESSION: No active disease. Electronically Signed   By: Fonda Field M.D.   On: 12/05/2023 19:25   MR BRAIN WO  CONTRAST Result Date: 12/05/2023 CLINICAL DATA:  Mental status change, unknown cause EXAM: MRI HEAD WITHOUT CONTRAST TECHNIQUE: Multiplanar, multiecho pulse sequences of the brain and surrounding structures were obtained without intravenous contrast. COMPARISON:  Same day CT head. FINDINGS: Brain: No acute infarction, hemorrhage, hydrocephalus, extra-axial collection or mass lesion. Mild to moderate scattered T2/FLAIR hyperintensities the white matter, nonspecific but compatible with chronic microvascular ischemic change. Vascular: Major arterial flow voids are maintained at the skull base. Skull and upper cervical spine: Normal marrow signal. Sinuses/Orbits: Right maxillary sinus retention cyst. Otherwise, largely clear sinuses. No acute orbital findings. Other: No mastoid effusions. IMPRESSION: No evidence of acute intracranial abnormality. Electronically Signed   By: Gilmore GORMAN Molt M.D.   On: 12/05/2023 18:46   DG Abd 1 View Result Date: 12/05/2023 CLINICAL DATA:  Nausea and vomiting. EXAM: ABDOMEN - 1 VIEW COMPARISON:  11/01/2013 FINDINGS: Supine abdomen shows no gaseous bowel dilatation to suggest obstruction. No unexpected abdominopelvic calcification. Degenerative changes are noted in the lumbar spine. IMPRESSION: Nonobstructive bowel gas pattern. Electronically Signed   By: Camellia Candle M.D.   On: 12/05/2023 05:16   CT Head Wo Contrast Result Date: 12/05/2023 CLINICAL DATA:  History of breast cancer presenting with diplopia with dizziness and weakness. EXAM: CT HEAD WITHOUT CONTRAST TECHNIQUE: Contiguous axial images were obtained from the base of the skull through the vertex without intravenous contrast. RADIATION DOSE REDUCTION: This exam was performed according to the departmental dose-optimization program which includes automated exposure control, adjustment of the mA and/or kV according to patient size and/or use of iterative reconstruction technique. COMPARISON:  June 29, 2022 FINDINGS:  Brain: No evidence of acute infarction, hemorrhage, hydrocephalus, extra-axial collection or mass lesion/mass effect. Vascular: No hyperdense vessel or unexpected calcification. Skull: Normal. Negative for fracture or focal lesion. Sinuses/Orbits: No acute  finding. Other: None. IMPRESSION: No acute intracranial pathology. Electronically Signed   By: Suzen Dials M.D.   On: 12/05/2023 03:29   NM PET Image Restag (PS) Skull Base To Thigh Result Date: 12/01/2023 CLINICAL DATA:  Subsequent treatment strategy for metastatic breast cancer. EXAM: NUCLEAR MEDICINE PET SKULL BASE TO THIGH TECHNIQUE: 9.92 mCi F-18 FDG was injected intravenously. Full-ring PET imaging was performed from the skull base to thigh after the radiotracer. CT data was obtained and used for attenuation correction and anatomic localization. Fasting blood glucose: 100 mg/dl COMPARISON:  PET-CT 87/79/7975. CT scan chest abdomen pelvis 11/23/2023. FINDINGS: Mediastinal blood pool activity: SUV max 3.2 Liver activity: SUV max 3.8 NECK: No specific abnormal uptake seen above blood pool in the neck including lymph node change of the submandibular, posterior triangle or internal jugular region. The previous hypermetabolic submandibular node is no longer identified. Near symmetric uptake of radiotracer along the visualized intracranial compartment. Incidental CT findings: The submandibular glands, parotid glands and thyroid  gland are unremarkable. There is some small less than 1 cm in size short axis nodes in the neck, not pathologic by size criteria and similar to previous. Right maxillary sinus mucous retention cyst or polyp. Jewelry along the tongue. CHEST: No specific abnormal radiotracer uptake seen above blood pool in the axillary region, hilum or mediastinum. Previous areas of uptake and thoracic nodal enlargement are improved from the prior PET-CT but were described as being improved on the more recent CT scan of the chest abdomen and pelvis.  There is some nonspecific mild uptake along the lower esophagus. Please correlate with any symptoms. No abnormal lung uptake identified this time. Incidental CT findings: Stable cardiac size. No pericardial effusion. The thoracic aorta is normal course and caliber. Small hiatal hernia. Small area of calcification along the SVC near the junction to the azygous. Breathing motion. No consolidation, pneumothorax or effusion. As seen previously there are several scattered tiny nodules identified in the lungs particularly the right upper lobe in a similar distribution to the recent prior examination. These appear progressive from the older CT scan as part of the PET of December 2024. No associated abnormal uptake but again these lesions are quite small. Please correlate for any clinical presentation otherwise would recommend short follow-up. ABDOMEN/PELVIS: There is physiologic distribution radiotracer along the parenchymal organs, bowel and renal collecting systems. On prior PET-CT there are some mildly enlarged hypermetabolic upper retroperitoneal nodes which again has shown improvement on the more recent CT scan and today do not show any abnormal residual uptake. There are some right inguinal nodes which show uptake today however. Example has maximum SUV value of 6.3 and on image 188 node measures 16 by 15 mm. This same node in December 2024 would measured 14 x 13 mm the and had maximum SUV of only 1.4. A new area of disease is possible but there is a differential. Incidental CT findings: Grossly the liver, spleen, adrenal glands and pancreas are unremarkable. Gallbladder is present. There is some contrast within the renal collecting systems the kidneys but this could relate to the recent CT scan of chest abdomen and pelvis. Contrast in the bladder which is relatively contracted. Normal caliber aorta and IVC. Bowel has a normal course and caliber. Scattered colonic stool. Scattered colonic diverticula. The small bowel  is nondilated. Stomach is distended with fluid and debris. Questionable stranding in the area of the second portion the duodenum. There is a caliber change in the bowel this location. Please correlate with clinical findings.  There is no specific abnormal uptake in this location. SKELETON: Once again there is some scattered sclerotic bone metastases identified particularly along the spine and pelvis. Additional areas along the sternum as well as the clavicles. By CT distribution is similar. Few areas are larger such as along the sternal region on image 88 compared to the study of December 2024. Is also some mild patchy marrow uptake identified in several areas which are increased such as along the left clavicular head with maximum SUV of 5.3. Sternal area on image 76 of 3.5 SUV maximum. T12 vertebral body maximum SUV value 5.3. Other examples as well of more uptake and more sclerosis. Incidental CT findings: Scattered degenerative changes identified. Curvature of the spine. Compression deformity of L3. IMPRESSION: Sclerotic bone metastases again identified. Several areas of sclerosis of increased in appearance compared to December 2024 and show more uptake. Distribution of disease is similar. Stable compression of L3 vertebral body. The previous uptake and increased size nodal disease on the December 2024 study has shown significant improvement. There is 1 lymph node in the right inguinal region which is slightly larger and shows some increased uptake. This could be a metastatic node but does have a differential. Please correlate with clinical findings. Once again several tiny lung nodules identified particularly in the right upper lobe. These do not show abnormal uptake. Please correlate for clinical presentation. This would have a differential and recommend attention on close follow-up. Distended stomach with fluid and debris with a caliber change at the proximal duodenum. There is slight soft tissue thickening  this location without abnormal uptake. Please correlate with specific symptoms. Further workup as clinically appropriate. Electronically Signed   By: Ranell Bring M.D.   On: 12/01/2023 15:04   CT Angio Chest PE W and/or Wo Contrast Result Date: 11/23/2023 CLINICAL DATA:  Chest pain, shortness of breath EXAM: CT ANGIOGRAPHY CHEST WITH CONTRAST TECHNIQUE: Multidetector CT imaging of the chest was performed using the standard protocol during bolus administration of intravenous contrast. Multiplanar CT image reconstructions and MIPs were obtained to evaluate the vascular anatomy. RADIATION DOSE REDUCTION: This exam was performed according to the departmental dose-optimization program which includes automated exposure control, adjustment of the mA and/or kV according to patient size and/or use of iterative reconstruction technique. CONTRAST:  OMNIPAQUE  IOHEXOL  350 MG/ML SOLN COMPARISON:  None Available. FINDINGS: Cardiovascular: Satisfactory opacification of the pulmonary arteries to the segmental level. No evidence of pulmonary embolism. Normal heart size. No pericardial effusion. Mediastinum/Nodes: No enlarged mediastinal, hilar, or axillary lymph nodes. Thyroid  gland, trachea, and esophagus demonstrate no significant findings. Lungs/Pleura: There is an ill-defined nodular pattern throughout both lungs with small small nodular densities that measured between a few mm up to 4 mm bilaterally. These findings in combination with the previously described findings of metastatic bone disease raises the possibility of metastatic lung disease. There is no other infiltrates or consolidations or pleural effusions. Upper Abdomen: No acute abnormality. Musculoskeletal: Diffuse sclerotic metastatic bone disease of the thoracic spine, sternum and ribs. Review of the MIP images confirms the above findings. IMPRESSION: *No evidence of pulmonary embolism. *Diffuse sclerotic metastatic bone disease. *Ill-defined nodular pattern  throughout both lungs with small nodular densities that measured between a few mm up to 4 mm bilaterally. These findings in combination with the previously described findings of metastatic bone disease raises the possibility of metastatic lung disease. Electronically Signed   By: Franky Chard M.D.   On: 11/23/2023 15:20   CT ABDOMEN PELVIS W  CONTRAST Result Date: 11/23/2023 CLINICAL DATA:  Abdominal pain EXAM: CT ABDOMEN AND PELVIS WITH CONTRAST TECHNIQUE: Multidetector CT imaging of the abdomen and pelvis was performed using the standard protocol following bolus administration of intravenous contrast. RADIATION DOSE REDUCTION: This exam was performed according to the departmental dose-optimization program which includes automated exposure control, adjustment of the mA and/or kV according to patient size and/or use of iterative reconstruction technique. CONTRAST:  OMNIPAQUE  IOHEXOL  350 MG/ML SOLN COMPARISON:  CT abdomen and pelvis August 23, 2022 FINDINGS: Lower chest: No infiltrates or consolidations, no pleural effusions Hepatobiliary: Liver normal size no masses no biliary dilatation. Gallbladder unremarkable. No gallstones. Pancreas: Pancreas normal size. No masses calcifications or inflammatory changes. Spleen: Spleen normal size.  No masses. Adrenals/Urinary Tract: Adrenal glands are normal size. Follow-up recommended. Kidneys are normal. No masses calcifications or hydronephrosis Stomach/Bowel: No small or large bowel obstruction or inflammatory changes. Diffuse pan diverticulosis unchanged since prior. Moderate amount of residual fecal material throughout the colon without obstruction or constipation. Vascular/Lymphatic: No significant vascular findings are present. No enlarged abdominal or pelvic lymph nodes. Reproductive: .  No masses. Bladder unremarkable. Other: Anterior abdominal wall unremarkable without evidence of umbilical or inguinal hernias Musculoskeletal: Again noted multiple sclerotic  lesions involving the thoracolumbar spine with fracture deformity of L3 correlates with metastatic bone disease. IMPRESSION: *No acute findings in the abdomen or pelvis. *Diffuse pan diverticulosis unchanged since prior. *Moderate amount of residual fecal material throughout the colon without obstruction or constipation. *Again noted multiple sclerotic lesions involving the thoracolumbar spine with fracture deformity of L3 correlates with metastatic bone disease. Electronically Signed   By: Franky Chard M.D.   On: 11/23/2023 15:17      Subjective: He denies any complaints today, tolerating her oral intake, no nausea, no vomiting, no abdominal pain  Discharge Exam: Vitals:   12/14/23 0811 12/14/23 1150  BP: 116/74 114/83  Pulse: 70 67  Resp:  18  Temp: 98.3 F (36.8 C) (!) 97.4 F (36.3 C)  SpO2: 98% 100%   Vitals:   12/13/23 2334 12/14/23 0628 12/14/23 0811 12/14/23 1150  BP: 124/74 111/74 116/74 114/83  Pulse: 77  70 67  Resp: 18 16  18   Temp: 99 F (37.2 C) 98.5 F (36.9 C) 98.3 F (36.8 C) (!) 97.4 F (36.3 C)  TempSrc: Oral Oral Oral Oral  SpO2: 97%  98% 100%  Weight:        General: Pt is alert, awake, not in acute distress Abdominal: Soft, NT, ND, Extremities: no edema, no cyanosis    The results of significant diagnostics from this hospitalization (including imaging, microbiology, ancillary and laboratory) are listed below for reference.     Microbiology: Recent Results (from the past 240 hours)  Blood Culture (routine x 2)     Status: None   Collection Time: 12/10/23  8:22 PM   Specimen: BLOOD RIGHT HAND  Result Value Ref Range Status   Specimen Description BLOOD RIGHT HAND  Final   Special Requests   Final    BOTTLES DRAWN AEROBIC AND ANAEROBIC Blood Culture adequate volume   Culture   Final    NO GROWTH 5 DAYS Performed at [facility], [address] [place], [place] [address]    Report Status 12/15/2023 FINAL  Final  Resp panel by RT-PCR  (RSV, Flu A&B, Covid) Anterior Nasal Swab     Status: None   Collection Time: 12/10/23  8:48 PM   Specimen: Anterior Nasal Swab  Result Value Ref Range Status   SARS Coronavirus 2 by RT PCR NEGATIVE NEGATIVE Final   Influenza A by PCR NEGATIVE NEGATIVE Final   Influenza B by PCR NEGATIVE NEGATIVE Final    Comment: (NOTE) The Xpert Xpress SARS-CoV-2/FLU/RSV plus assay is intended as an aid in the diagnosis of influenza from Nasopharyngeal swab specimens and should not be used as a sole basis for treatment. Nasal washings and aspirates are unacceptable for Xpert Xpress SARS-CoV-2/FLU/RSV testing.  Fact Sheet for Patients: BloggerCourse.com  Fact Sheet for Healthcare Providers: SeriousBroker.it  This test is not yet approved or cleared by the [place]  FDA and has been authorized for detection and/or diagnosis of SARS-CoV-2 by FDA under an Emergency Use Authorization (EUA). This EUA will remain in effect (meaning this test can be used) for the duration of the COVID-19 declaration under Section 564(b)(1) of the Act, 21 U.S.C. section 360bbb-3(b)(1), unless the authorization is terminated or revoked.     Resp Syncytial Virus by PCR NEGATIVE NEGATIVE Final    Comment: (NOTE) Fact Sheet for Patients: BloggerCourse.com  Fact Sheet for Healthcare Providers: SeriousBroker.it  This test is not yet approved or cleared by the [place]  FDA and has been authorized for detection and/or diagnosis of SARS-CoV-2 by FDA under an Emergency Use Authorization (EUA). This EUA will remain in effect (meaning this test can be used) for the duration of the COVID-19 declaration under Section 564(b)(1) of the Act, 21 U.S.C. section 360bbb-3(b)(1), unless the authorization is terminated or revoked.  Performed at [facility], [address], [place], [place] [address]   Blood Culture  (routine x 2)     Status: None   Collection Time: 12/10/23  8:48 PM   Specimen: BLOOD  Result Value Ref Range Status   Specimen Description BLOOD RIGHT ANTECUBITAL  Final   Special Requests   Final    BOTTLES DRAWN AEROBIC AND ANAEROBIC Blood Culture adequate volume   Culture   Final    NO GROWTH 5 DAYS Performed at [facility], [address] [place], [place] [address]    Report Status 12/15/2023 FINAL  Final     Labs: BNP (last 3 results) No results for input(s): BNP in the last 8760 hours. Basic Metabolic Panel: Recent Labs  Lab 12/10/23 2048 12/11/23 0045 12/11/23 0518 12/11/23 0818 12/13/23 0533 12/14/23 0353 12/15/23 1147  NA  --  135 135  --  134* 135 139  K  --  3.0* 3.0*  --  3.3* 3.3* 3.1*  CL  --  81* 86*  --  101 103 102  CO2  --  39* 35*  --  23 24 26   GLUCOSE  --  106* 100*  --  89 76 96  BUN  --  49* 44*  --  13 11 13   CREATININE  --  3.84* 2.93*  --  0.78 0.91 0.99  CALCIUM   --  6.4* 6.6*  --  8.1* 8.0* 8.7*  MG 2.3  --  2.4 2.1 1.9 1.7  --   PHOS 4.8*  --   --   --   --   --   --    Liver Function Tests: Recent Labs  Lab 12/10/23 2004 12/11/23 0518 12/13/23 0533 12/14/23 0353 12/15/23 1147  AST 98* 80* 49* 53* 49*  ALT 96* 79* 46* 54* 60*  ALKPHOS 164* 135* 134* 137* 200*  BILITOT 1.0 1.0 0.8 0.7 0.4  PROT 9.1* 6.9 6.3* 6.8 8.1  ALBUMIN 4.4 3.5 3.0* 3.2* 4.3   Recent Labs  Lab 12/10/23 2048  LIPASE 45   No results for input(s): AMMONIA in the last 168 hours. CBC: Recent Labs  Lab 12/10/23 2004 12/10/23 2005 12/11/23 0518 12/13/23 0533 12/14/23 0353 12/15/23 1147  WBC 10.3  --  9.1 5.8 5.7 5.5  NEUTROABS 6.7  --   --  3.9 4.0 3.9  HGB 13.6 16.0* 10.7* 9.5* 10.3* 10.9*  HCT 43.0 47.0* 34.1* 29.7* 32.6* 33.9*  MCV 75.2*  --  75.1* 74.4* 75.1* 72.9*  PLT 324  --  223 175 194 233   Cardiac Enzymes: Recent Labs  Lab 12/10/23 2048  CKTOTAL 260*   BNP: Invalid input(s): POCBNP CBG: Recent Labs  Lab  12/10/23 2000  GLUCAP 251*   D-Dimer No results for input(s): DDIMER in the last 72 hours. Hgb A1c No results for input(s): HGBA1C in the last 72 hours. Lipid Profile No results for input(s): CHOL, HDL, LDLCALC, TRIG, CHOLHDL, LDLDIRECT in the last 72 hours. Thyroid  function studies No results for input(s): TSH, T4TOTAL, T3FREE, THYROIDAB in the last 72 hours.  Invalid input(s): FREET3 Anemia work up No results for input(s): VITAMINB12, FOLATE, FERRITIN, TIBC, IRON, RETICCTPCT in the last 72 hours. Urinalysis    Component Value Date/Time   COLORURINE YELLOW 12/11/2023 0454   APPEARANCEUR HAZY (A) 12/11/2023 0454   LABSPEC 1.011 12/11/2023 0454   PHURINE 9.0 (H) 12/11/2023 0454   GLUCOSEU 50 (A) 12/11/2023 0454   HGBUR SMALL (A) 12/11/2023 0454   BILIRUBINUR NEGATIVE 12/11/2023 0454   KETONESUR NEGATIVE 12/11/2023 0454   PROTEINUR NEGATIVE 12/11/2023 0454   UROBILINOGEN 0.2 11/01/2013 1246   NITRITE NEGATIVE 12/11/2023 0454   LEUKOCYTESUR NEGATIVE 12/11/2023 0454   Sepsis Labs Recent Labs  Lab 12/11/23 0518 12/13/23 0533 12/14/23 0353 12/15/23 1147  WBC 9.1 5.8 5.7 5.5   Microbiology Recent Results (from the past 240 hours)  Blood Culture (routine x 2)     Status: None   Collection Time: 12/10/23  8:22 PM   Specimen: BLOOD RIGHT HAND  Result Value Ref Range Status   Specimen Description BLOOD RIGHT HAND  Final   Special Requests   Final    BOTTLES DRAWN AEROBIC AND ANAEROBIC Blood Culture adequate volume   Culture   Final    NO GROWTH 5 DAYS Performed at [facility], [address], [place], [place] [address]    Report Status 12/15/2023 FINAL  Final  Resp panel by RT-PCR (RSV, Flu A&B, Covid) Anterior Nasal Swab     Status: None   Collection Time: 12/10/23  8:48 PM   Specimen: Anterior Nasal Swab  Result Value Ref Range Status   SARS Coronavirus 2 by RT PCR NEGATIVE NEGATIVE Final   Influenza A by PCR NEGATIVE  NEGATIVE Final   Influenza B by PCR NEGATIVE NEGATIVE Final    Comment: (NOTE) The Xpert Xpress SARS-CoV-2/FLU/RSV plus assay is intended as an aid in the diagnosis of influenza from Nasopharyngeal swab specimens and should not be used as a sole basis for treatment. Nasal washings and aspirates are unacceptable for Xpert Xpress SARS-CoV-2/FLU/RSV testing.  Fact Sheet for Patients: BloggerCourse.com  Fact Sheet for Healthcare Providers: SeriousBroker.it  This test is not yet approved or cleared by the [place]  FDA and has been authorized for detection and/or diagnosis of SARS-CoV-2 by FDA under an Emergency Use Authorization (EUA). This EUA will remain in effect (meaning this test can be used) for the duration of  the COVID-19 declaration under Section 564(b)(1) of the Act, 21 U.S.C. section 360bbb-3(b)(1), unless the authorization is terminated or revoked.     Resp Syncytial Virus by PCR NEGATIVE NEGATIVE Final    Comment: (NOTE) Fact Sheet for Patients: BloggerCourse.com  Fact Sheet for Healthcare Providers: SeriousBroker.it  This test is not yet approved or cleared by the [place]  FDA and has been authorized for detection and/or diagnosis of SARS-CoV-2 by FDA under an Emergency Use Authorization (EUA). This EUA will remain in effect (meaning this test can be used) for the duration of the COVID-19 declaration under Section 564(b)(1) of the Act, 21 U.S.C. section 360bbb-3(b)(1), unless the authorization is terminated or revoked.  Performed at [facility], [address], [place], [place] [address]   Blood Culture (routine x 2)     Status: None   Collection Time: 12/10/23  8:48 PM   Specimen: BLOOD  Result Value Ref Range Status   Specimen Description BLOOD RIGHT ANTECUBITAL  Final   Special Requests   Final    BOTTLES DRAWN AEROBIC AND ANAEROBIC Blood  Culture adequate volume   Culture   Final    NO GROWTH 5 DAYS Performed at [facility], [address], [place], [place] [address]    Report Status 12/15/2023 FINAL  Final     Time coordinating discharge: Over 30 minutes  SIGNED:   Brayton Lye, MD  [facility] 12/15/2023, 7:25 PM Pager   If 7PM-7AM, please contact night-coverage [email or web address]

## 2023-12-16 ENCOUNTER — Telehealth: Payer: Self-pay | Admitting: *Deleted

## 2023-12-16 NOTE — Transitions of Care (Post Inpatient/ED Visit) (Signed)
 12/16/2023  Name: Chelsea Hansen MRN: [id number] DOB: [birth year]  Today's TOC FU Call Status: Today's TOC FU Call Status:: Successful TOC FU Call Completed TOC FU Call Complete Date: 12/16/23 Patient's Name and Date of Birth confirmed.  Transition Care Management Follow-up Telephone Call Date of Discharge: 12/14/23 Discharge Facility: [name]) Type of Discharge: Inpatient Admission Primary Inpatient Discharge Diagnosis:: Acute kidney injury superimposed on chronic kidney disease How have you been since you were released from the hospital?: Same Any questions or concerns?: Yes Patient Questions/Concerns:: Patient frustrated about recent change to pain medication. She does not understand why Oxycontin  10mg  was stopped. Patient Questions/Concerns Addressed: Notified Provider of Patient Questions/Concerns (Secure communication to Oncology and Palliative Care. Patient also plans to discuss during scheduled visit on 12/22/23)  Items Reviewed: Did you receive and understand the discharge instructions provided?: Yes Medications obtained,verified, and reconciled?: Yes (Medications Reviewed) Any new allergies since your discharge?: No Dietary orders reviewed?: Yes Type of Diet Ordered:: low sodium heart healthy, soft diet Do you have support at home?: Yes (Family provides support) People in Home [RPT]: alone Name of Support/Comfort Primary Source: Patient has adult children that are supportive  Medications Reviewed Today: Medications Reviewed Today     Reviewed by [name], RN (Registered Nurse) on 12/16/23 at 1040  Med List Status: <None>   Medication Order Taking? Sig Documenting Provider Last Dose Status Informant  albuterol  (VENTOLIN  HFA) 108 (90 Base) MCG/ACT inhaler [id number] Yes Inhale 2 puffs by mouth into the lungs every 6 hours as needed for wheezing or shortness of breath. [name], NP  Active Self           Med Note (CRUTHIS, CHLOE C   Tue Dec 13, 2023   8:00 AM)    anastrozole  (ARIMIDEX ) 1 MG tablet [id number]  Take 1 tablet (1 mg total) by mouth daily.  Patient not taking: Reported on 12/16/2023   [name], NP  Active Self           Med Note (CRUTHIS, CHLOE C   Tue Dec 13, 2023  8:00 AM)    calcitRIOL  (ROCALTROL ) 0.5 MCG capsule [id number] Yes Take 1 capsule (0.5 mcg total) by mouth daily. [name], [name], MD  Active   Calcium  Carb-Cholecalciferol  600-10 MG-MCG TABS [id number] Yes Take 1 tablet by mouth 3 (three) times daily. [name], [name], MD  Active   fluconazole  (DIFLUCAN ) 100 MG tablet [id number] Yes Take 2 tablets (200 mg total) by mouth daily for 12 days. [name], [name], MD  Active   gabapentin  (NEURONTIN ) 300 MG capsule [id number]  Take 1 capsule (300 mg total) by mouth 3 (three) times daily.  Patient not taking: Reported on 12/16/2023   [name], [name], MD  Active Self           Med Note (CRUTHIS, CHLOE C   Tue Dec 13, 2023  8:00 AM)    montelukast  (SINGULAIR ) 10 MG tablet [id number] Yes Take 1 tablet (10 mg total) by mouth at bedtime. [name], NP  Active Self           Med Note (CRUTHIS, CHLOE C   Tue Dec 13, 2023  8:00 AM)    oxyCODONE -acetaminophen  (PERCOCET) 7.5-325 MG tablet [id number] Yes Take 1-2 tablets by mouth every 6 (six) hours as needed for severe pain (pain score 7-10). [name], [name], NP  Active Self           Med Note (CRUTHIS, CHLOE C  Tue Dec 13, 2023  8:00 AM)    pantoprazole  (PROTONIX ) 40 MG tablet [id number] Yes Take 1 tablet (40 mg total) by mouth 2 (two) times daily. [name], [name], MD  Active   polyethylene glycol powder (GLYCOLAX /MIRALAX ) 17 GM/SCOOP powder [id number] Yes Mix 17 g in 4 oz of liquid and take by mouth 2 (two) times daily as needed. [name], NP  Active Self           Med Note (CRUTHIS, CHLOE C   Tue Dec 13, 2023  8:00 AM)    prochlorperazine  (COMPAZINE ) 10 MG tablet [id number] Yes Take 1 tablet (10 mg total) by mouth every 6 (six)  hours as needed for nausea or vomiting. [name], [name], MD  Active Self  propranolol  (INDERAL ) 10 MG tablet [id number]  Take 1 tablet (10 mg total) by mouth 2 (two) times daily. Please call [phone] to schedule an appointment for future refills. Thank you. 3rd attempt.  Patient not taking: Reported on 12/16/2023   [name], [name], DO  Active Self           Med Note (CRUTHIS, CHLOE C   Tue Dec 13, 2023  8:00 AM)    scopolamine  (TRANSDERM-SCOP) 1 MG/3DAYS [id number] Yes Place 1 patch (1.5 mg total) onto the skin every 3 (three) days as needed (Dizziness, nausea or vomiting). [name], [name], MD  Active Self           Med Note (CRUTHIS, CHLOE C   Tue Dec 13, 2023  8:00 AM)    senna (SENOKOT) 8.6 MG TABS tablet [id number]  Take 2 tablets (17.2 mg total) by mouth at bedtime.  Patient not taking: Reported on 12/16/2023   Davia [name], MD  Active Self           Med Note (CRUTHIS, CHLOE C   Tue Dec 13, 2023  8:00 AM)    sennosides-docusate sodium  (SENOKOT-S) 8.6-50 MG tablet [id number]  Take 1 tablet by mouth daily.  Patient not taking: Reported on 12/16/2023   [name], NP  Active Self           Med Note (CRUTHIS, CHLOE C   Tue Dec 13, 2023  8:00 AM)    Med List Note Rockney Norleen LABOR, RPH-CPP 07/07/22 [id number]): Abemaciclib  through [facility];   UDS 02/11/22  MR 05/22/22 Medication Agreement signed. LP  Hydrocodone  approved through 5 13 23             Home Care and Equipment/Supplies: Were Home Health Services Ordered?: No Any new equipment or medical supplies ordered?: No  Functional Questionnaire: Do you need assistance with bathing/showering or dressing?: No Do you need assistance with meal preparation?: No Do you need assistance with eating?: No Do you have difficulty maintaining continence: No Do you need assistance with getting out of bed/getting out of a chair/moving?: No Do you have difficulty managing or taking your medications?: No  Follow up appointments reviewed: PCP  Follow-up appointment confirmed?: No (Patient followed closely by Oncology) MD Provider Line [phone] Given: No Specialist Hospital Follow-up appointment confirmed?: Yes Date of Specialist follow-up appointment?: 12/15/23 Follow-Up Specialty Provider:: Oncology, follow up with Oncology on 12/22/23 Do you need transportation to your follow-up appointment?: No Do you understand care options if your condition(s) worsen?: Yes-patient verbalized understanding  SDOH Interventions Today    Flowsheet Row Most Recent Value  SDOH Interventions   Food Insecurity Interventions Intervention Not Indicated  Housing Interventions Intervention Not Indicated  Transportation Interventions Intervention Not  Indicated  Utilities Interventions Intervention Not Indicated   [facility] patient. RNCM contacted [facility] to connect with CM. Patient is assigned to [facility] [phone], ext [phone] to connect patient with her assigned CM.  [name] RN, BSN [place]  [facility] RN Care Manager [phone]

## 2023-12-19 ENCOUNTER — Other Ambulatory Visit (HOSPITAL_COMMUNITY): Payer: Self-pay

## 2023-12-19 ENCOUNTER — Encounter (INDEPENDENT_AMBULATORY_CARE_PROVIDER_SITE_OTHER): Payer: Self-pay | Admitting: Primary Care

## 2023-12-19 ENCOUNTER — Telehealth: Payer: Self-pay

## 2023-12-19 ENCOUNTER — Other Ambulatory Visit: Payer: Self-pay | Admitting: Hematology and Oncology

## 2023-12-19 ENCOUNTER — Ambulatory Visit (INDEPENDENT_AMBULATORY_CARE_PROVIDER_SITE_OTHER): Payer: MEDICAID | Admitting: Primary Care

## 2023-12-19 VITALS — BP 103/71 | HR 107 | Resp 16 | Ht 71.0 in | Wt 173.4 lb

## 2023-12-19 DIAGNOSIS — E861 Hypovolemia: Secondary | ICD-10-CM

## 2023-12-19 DIAGNOSIS — K5903 Drug induced constipation: Secondary | ICD-10-CM | POA: Diagnosis not present

## 2023-12-19 DIAGNOSIS — Z09 Encounter for follow-up examination after completed treatment for conditions other than malignant neoplasm: Secondary | ICD-10-CM | POA: Diagnosis not present

## 2023-12-19 DIAGNOSIS — Z170 Estrogen receptor positive status [ER+]: Secondary | ICD-10-CM

## 2023-12-19 DIAGNOSIS — N179 Acute kidney failure, unspecified: Secondary | ICD-10-CM

## 2023-12-19 DIAGNOSIS — E860 Dehydration: Secondary | ICD-10-CM | POA: Diagnosis not present

## 2023-12-19 MED ORDER — SODIUM CHLORIDE 0.9 % IV BOLUS: 1000.0000 mL | Freq: Once | INTRAVENOUS | Status: DC

## 2023-12-19 NOTE — Telephone Encounter (Signed)
-----   Message from [name] sent at 12/19/2023 11:14 AM EDT ----- She was compliant with her bowel regimen, fluconazole , and PPI therapy when I saw her last time. I am seeing her again on Thursday for labs and f/u in case she needs more fluids or potassium. 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 I'm not sure if she has been able to take her PPI therapy, complete her 14 day course of fluconazole , and are taking treatments for constipation.   Technically this will be Dr. Rennis patient. I will attach our nurses to see if there is any earlier availability for a clinic appt. If her N&V is very severe and she cannot tolerate PO, she may have to go to the ED. ----- Message ----- From: Dimes Andrea DELENA, RN Sent: 12/19/2023   8:12 AM EDT To: [name] Borer, NP; Morna Corn#  Please see attached patient. Thank you   Melanie ----- Message ----- From: Federico Rosario JAYSON, MD Sent: 12/18/2023  10:45 PM EDT To: Andrea DELENA Dimes, RN; [name] Pickenpack-Cous#  Hi, could you please attach the patient's name/DOB or MRN? That way I can review her chart. I looked at my schedule on 7/22 but there is no Ms. Zody on that day ----- Message ----- From: Kendall Morna Pickle, NP Sent: 12/16/2023  11:46 AM EDT To: Andrea DELENA Dimes, RN; [name] Pickenpack-Cous#  Hey Dr. Federico,  You completed the upper endoscopy on her.  She is still struggling with nausea.  Her f/u with GI is not until 7/22 -can you please advise on that part?  Deferring to you Levon on the second part.      Thanks,   Morna ----- Message ----- From:  Dimes Andrea DELENA, RN Sent: 12/16/2023  11:28 AM EDT To: [name] Borer, NP; Morna Corn#  Hello,   Transitions of Care call competed with Ms. Stoermer today. She expressed concern about her pain medication change during recent hospitalization. Her long acting Oxycontin  10 mg was d/c'd and she does not understand why this change was made. She reports back pain 10/10 today with continued N/V. Can either of you address this with her? She said that she was out of it yesterday and did not discuss this during her office visits. Thank you  [name] RN, BSN [place]  [facility] RN Care Manager [phone]

## 2023-12-19 NOTE — Telephone Encounter (Signed)
 Lm on vm for patient to return call to schedule sooner appt.

## 2023-12-19 NOTE — Progress Notes (Signed)
 Subjective:   Chelsea Hansen is a 53 y.o. female presents for hospital follow up. Admit date to [facility] was 12/10/23, patient was discharged on 12/14/23, patient was admitted for: AKI and hypocalcemia. She look very weak and acutely ill. Abdominal pain and unable to keep fluid including water and no BM since she left hospital . We went over her BM regimen,   Past Medical History:  Diagnosis Date   Allergy    seasonal allergies   Anxiety    on meds   Asthma    uses inhaler   Breast cancer (HCC) 2012   RIGHT lumpectomy   Cancer (HCC) 2022   RIGHT breast lump-dx 2022   Depression    on meds   DVT (deep venous thrombosis) (HCC) 2010   after hysterectomy   Family history of uterine cancer    GERD (gastroesophageal reflux disease)    with certain foods/OTC PRN meds   Headache(784.0)    History of radiation therapy    Bilateral breast- 09/03/21-11/02/21- Dr. Lynwood Nasuti   Hyperlipidemia    on meds   Hypertension    on meds   SVT (supraventricular tachycardia) (HCC)      No Known Allergies  No current facility-administered medications on file prior to visit.   Current Outpatient Medications on File Prior to Visit  Medication Sig Dispense Refill   albuterol  (VENTOLIN  HFA) 108 (90 Base) MCG/ACT inhaler Inhale 2 puffs by mouth into the lungs every 6 hours as needed for wheezing or shortness of breath. 18 g 2   anastrozole  (ARIMIDEX ) 1 MG tablet Take 1 tablet (1 mg total) by mouth daily. 90 tablet 3   calcitRIOL  (ROCALTROL ) 0.5 MCG capsule Take 1 capsule (0.5 mcg total) by mouth daily. 30 capsule 0   Calcium  Carb-Cholecalciferol  600-10 MG-MCG TABS Take 1 tablet by mouth 3 (three) times daily. 90 tablet 0   fluconazole  (DIFLUCAN ) 100 MG tablet Take 2 tablets (200 mg total) by mouth daily for 12 days. 24 tablet 0   gabapentin  (NEURONTIN ) 300 MG capsule Take 1 capsule (300 mg total) by mouth 3 (three) times daily. 90 capsule 6   montelukast  (SINGULAIR ) 10 MG tablet Take 1 tablet  (10 mg total) by mouth at bedtime. 90 tablet 3   pantoprazole  (PROTONIX ) 40 MG tablet Take 1 tablet (40 mg total) by mouth 2 (two) times daily. 60 tablet 0   polyethylene glycol powder (GLYCOLAX /MIRALAX ) 17 GM/SCOOP powder Mix 17 g in 4 oz of liquid and take by mouth 2 (two) times daily as needed. (Patient taking differently: Take 17 g by mouth 2 (two) times daily as needed (for constipation).) 3350 g 1   prochlorperazine  (COMPAZINE ) 10 MG tablet Take 1 tablet (10 mg total) by mouth every 6 (six) hours as needed for nausea or vomiting. 30 tablet 1   [Paused] propranolol  (INDERAL ) 10 MG tablet Take 1 tablet (10 mg total) by mouth 2 (two) times daily. Please call [phone] to schedule an appointment for future refills. Thank you. 3rd attempt. (Patient not taking: Reported on 12/13/2023) 30 tablet 0   scopolamine  (TRANSDERM-SCOP) 1 MG/3DAYS Place 1 patch (1.5 mg total) onto the skin every 3 (three) days as needed (Dizziness, nausea or vomiting). 10 patch 12   senna (SENOKOT) 8.6 MG TABS tablet Take 2 tablets (17.2 mg total) by mouth at bedtime. (Patient not taking: Reported on 12/22/2023) 60 tablet 3   sennosides-docusate sodium  (SENOKOT-S) 8.6-50 MG tablet Take 1 tablet by mouth daily. 120 tablet 1  Review of System: ROS Comprehensive ROS Pertinent positive and negative noted in HPI   Objective:   Physical Exam Vitals reviewed.  Constitutional:      Appearance: Normal appearance. She is ill-appearing.  HENT:     Head: Normocephalic.     Right Ear: External ear normal.     Left Ear: External ear normal.     Nose: Nose normal.     Mouth/Throat:     Mouth: Mucous membranes are dry.  Eyes:     Extraocular Movements: Extraocular movements intact.     Pupils: Pupils are equal, round, and reactive to light.  Cardiovascular:     Rate and Rhythm: Normal rate and regular rhythm.  Pulmonary:     Effort: Pulmonary effort is normal.     Breath sounds: Normal breath sounds.  Abdominal:      General: Bowel sounds are normal.     Palpations: Abdomen is soft.  Musculoskeletal:        General: Normal range of motion.     Cervical back: Normal range of motion.  Skin:    General: Skin is warm and dry.  Neurological:     Mental Status: She is alert and oriented to person, place, and time.  Psychiatric:        Mood and Affect: Mood normal.        Behavior: Behavior normal.        Thought Content: Thought content normal.      Assessment:  Evaleigh was seen today for hospitalization follow-up.  Diagnoses and all orders for this visit:  Hospital discharge follow-up See HPI  Hypotension due to hypovolemia 2/2  Dehydration -     sodium chloride  0.9 % bolus 1,000 mL  Drug-induced constipation Reviewed medication to start having a daylily BM    This note has been created with Education officer, environmental. Any transcriptional errors are unintentional.  ONCOLOGY   Rosaline SHAUNNA Bohr, NP 12/26/2023, 1:53 PM

## 2023-12-19 NOTE — Telephone Encounter (Signed)
 Appt has been rescheduled

## 2023-12-19 NOTE — Telephone Encounter (Signed)
 Patient son stated that July the 3rd at 8:40 is good for them. Please advise.

## 2023-12-19 NOTE — Telephone Encounter (Signed)
 Cirigliano, Vito V, DO  Abdishakur Gottschall, [place], RN; [place], Morna Pickle, NP; Federico Rosario BROCKS, MD; [name], RN; [name], [name], NP; 2 others Agree that she has multiple potential sources for ongoing nausea/vomiting.  We may also be able to find improvement with Motegrity, as this would treat her constipation and also may serve as a promotility agent for her stomach and aid in her nausea/vomiting.  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  Some of her antiemetic options were limited by QTc prolongation previously.  Most recent EKG shows QTc 440, so can maybe liberalize treatment options at this point (and depending on repeat labs this week).  Agree with trying to get her in w/ one of the APPs this week to expedite if possible.  VC

## 2023-12-20 ENCOUNTER — Other Ambulatory Visit (HOSPITAL_COMMUNITY): Payer: Self-pay

## 2023-12-21 ENCOUNTER — Other Ambulatory Visit (HOSPITAL_COMMUNITY): Payer: Self-pay

## 2023-12-21 ENCOUNTER — Other Ambulatory Visit: Payer: Self-pay

## 2023-12-21 ENCOUNTER — Other Ambulatory Visit: Payer: Self-pay | Admitting: Nurse Practitioner

## 2023-12-21 DIAGNOSIS — Z515 Encounter for palliative care: Secondary | ICD-10-CM

## 2023-12-21 DIAGNOSIS — C7951 Secondary malignant neoplasm of bone: Secondary | ICD-10-CM

## 2023-12-21 DIAGNOSIS — G893 Neoplasm related pain (acute) (chronic): Secondary | ICD-10-CM

## 2023-12-21 DIAGNOSIS — Z170 Estrogen receptor positive status [ER+]: Secondary | ICD-10-CM

## 2023-12-21 MED ORDER — OXYCODONE-ACETAMINOPHEN 7.5-325 MG PO TABS
1.0000 | ORAL_TABLET | Freq: Four times a day (QID) | ORAL | 0 refills | Status: DC | PRN
Start: 2023-12-21 — End: 2024-01-20
  Filled 2023-12-21: qty 102, 13d supply, fill #0

## 2023-12-21 NOTE — Progress Notes (Deleted)
 12/21/2023 Chelsea Hansen [id number] Jul 01, 1970  Referring provider: [name], NP Primary GI doctor: Dr. [name] ( Dr. Aneita)  ASSESSMENT AND PLAN:  Nausea and vomiting with recent admission 6/22 through 6/25 PET scan abnormal findings distended stomach and abnormal duodenum 12/13/2023 EGD duodenal stenosis status post dilation, grade C esophagitis concerning for Candida esophagitis and esophageal stenosis large hiatal hernia.  H. pylori negative Completed 14 days of fluconazole  therapy Recall EGD 2 to 3 months  Possible colonic lesion on noncontrast CT scan 12/10/23.  Possible apple core lesion measuring 2.5 cm in the hepatic flexure.  PET scan on 12/01/2023 without activity in that area.  She had a screening colonoscopy May 2022.  Bowel prep was fair.  A sessile 7 mm nonadenomatous polyp was removed in the transverse colon Will set up for colonoscopy  Constipation likely secondary to chronic pain medications Received Relistor  and MiraLAX   Metastatic breast cancer to bone Diagnosed 2022 status post neoadjuvant chemotherapy, lumpectomy, radiation 2023 Following up with palliative care 12/15/2023  Patient Care Team: Chelsea Rosaline SQUIBB, NP as PCP - General (Internal Medicine) [name], DO as PCP - Cardiology (Cardiology) [name], MD as Consulting Physician (Radiation Oncology) [name], MD as Consulting Physician (General Surgery) [name], MD as Consulting Physician (Hematology and Oncology) [name], [name], NP as Nurse Practitioner (Nurse Practitioner)  HISTORY OF PRESENT ILLNESS: 53 y.o. female with a past medical history listed below presents for evaluation of ***.   Patient previously known to Dr. Aneita last seen 07/29/2022 in the office.  Recently seen by our inpatient team in the hospital, Dr. [name], admitted 6/22 through 12/14/2023 for recurrent nausea vomiting.  Had multiple admissions prior to that for nausea vomiting  her Abemarciclib was discontinued without change. Recent PET scan 6/5 distended stomach and caliber change in duodenum with thickening but without abnormal uptake.  On chronic pain medications.  *** Discussed the use of AI scribe software for clinical note transcription with the patient, who gave verbal consent to proceed.  History of Present Illness            She  reports that she has never smoked. She has never used smokeless tobacco. She reports that she does not currently use alcohol after a past usage of about 7.0 standard drinks of alcohol per week. She reports that she does not use drugs.  RELEVANT GI HISTORY, IMAGING AND LABS: Results          CBC    Component Value Date/Time   WBC 5.5 12/15/2023 1147   WBC 5.7 12/14/2023 0353   RBC 4.65 12/15/2023 1147   HGB 10.9 (L) 12/15/2023 1147   HGB 11.5 10/06/2020 1432   HCT 33.9 (L) 12/15/2023 1147   HCT 36.8 10/06/2020 1432   PLT 233 12/15/2023 1147   PLT 323 10/06/2020 1432   MCV 72.9 (L) 12/15/2023 1147   MCV 73 (L) 10/06/2020 1432   MCH 23.4 (L) 12/15/2023 1147   MCHC 32.2 12/15/2023 1147   RDW 14.4 12/15/2023 1147   RDW 15.9 (H) 10/06/2020 1432   LYMPHSABS 0.9 12/15/2023 1147   LYMPHSABS 1.9 10/06/2020 1432   MONOABS 0.4 12/15/2023 1147   EOSABS 0.1 12/15/2023 1147   EOSABS 0.1 10/06/2020 1432   BASOSABS 0.0 12/15/2023 1147   BASOSABS 0.0 10/06/2020 1432   Recent Labs    12/05/23 0123 12/05/23 1705 12/06/23 9361 12/07/23 0555 12/10/23 2004 12/10/23 2005 12/11/23 0518 12/13/23 0533 12/14/23 0353 12/15/23 1147  HGB  12.0 10.6* 10.3* 11.3* 13.6 16.0* 10.7* 9.5* 10.3* 10.9*    CMP     Component Value Date/Time   NA 139 12/15/2023 1147   NA 144 11/30/2023 1000   K 3.1 (L) 12/15/2023 1147   CL 102 12/15/2023 1147   CO2 26 12/15/2023 1147   GLUCOSE 96 12/15/2023 1147   BUN 13 12/15/2023 1147   BUN 19 11/30/2023 1000   CREATININE 0.99 12/15/2023 1147   CALCIUM  8.7 (L) 12/15/2023 1147   PROT 8.1  12/15/2023 1147   PROT 7.3 11/30/2023 1000   ALBUMIN 4.3 12/15/2023 1147   ALBUMIN 4.5 11/30/2023 1000   AST 49 (H) 12/15/2023 1147   ALT 60 (H) 12/15/2023 1147   ALKPHOS 200 (H) 12/15/2023 1147   BILITOT 0.4 12/15/2023 1147   GFRNONAA >60 12/15/2023 1147   GFRAA >60 09/07/2019 1340      Latest Ref Rng & Units 12/15/2023   11:47 AM 12/14/2023    3:53 AM 12/13/2023    5:33 AM  Hepatic Function  Total Protein 6.5 - 8.1 g/dL 8.1  6.8  6.3   Albumin 3.5 - 5.0 g/dL 4.3  3.2  3.0   AST 15 - 41 U/L 49  53  49   ALT 0 - 44 U/L 60  54  46   Alk Phosphatase 38 - 126 U/L 200  137  134   Total Bilirubin 0.0 - 1.2 mg/dL 0.4  0.7  0.8       Current Medications:   Current Outpatient Medications (Endocrine & Metabolic):    calcitRIOL  (ROCALTROL ) 0.5 MCG capsule, Take 1 capsule (0.5 mcg total) by mouth daily.   Current Outpatient Medications (Cardiovascular):    [Paused] propranolol  (INDERAL ) 10 MG tablet, Take 1 tablet (10 mg total) by mouth 2 (two) times daily. Please call [phone] to schedule an appointment for future refills. Thank you. 3rd attempt. (Patient not taking: Reported on 12/16/2023)   Current Outpatient Medications (Respiratory):    albuterol  (VENTOLIN  HFA) 108 (90 Base) MCG/ACT inhaler, Inhale 2 puffs by mouth into the lungs every 6 hours as needed for wheezing or shortness of breath.   montelukast  (SINGULAIR ) 10 MG tablet, Take 1 tablet (10 mg total) by mouth at bedtime.   Current Outpatient Medications (Analgesics):    oxyCODONE -acetaminophen  (PERCOCET) 7.5-325 MG tablet, Take 1-2 tablets by mouth every 6 (six) hours as needed for severe pain (pain score 7-10).     Current Outpatient Medications (Other):    anastrozole  (ARIMIDEX ) 1 MG tablet, Take 1 tablet (1 mg total) by mouth daily. (Patient not taking: Reported on 12/16/2023)   Calcium  Carb-Cholecalciferol  600-10 MG-MCG TABS, Take 1 tablet by mouth 3 (three) times daily.   fluconazole  (DIFLUCAN ) 100 MG tablet,  Take 2 tablets (200 mg total) by mouth daily for 12 days.   gabapentin  (NEURONTIN ) 300 MG capsule, Take 1 capsule (300 mg total) by mouth 3 (three) times daily. (Patient not taking: Reported on 12/16/2023)   pantoprazole  (PROTONIX ) 40 MG tablet, Take 1 tablet (40 mg total) by mouth 2 (two) times daily.   polyethylene glycol powder (GLYCOLAX /MIRALAX ) 17 GM/SCOOP powder, Mix 17 g in 4 oz of liquid and take by mouth 2 (two) times daily as needed.   prochlorperazine  (COMPAZINE ) 10 MG tablet, Take 1 tablet (10 mg total) by mouth every 6 (six) hours as needed for nausea or vomiting.   scopolamine  (TRANSDERM-SCOP) 1 MG/3DAYS, Place 1 patch (1.5 mg total) onto the skin every 3 (three) days as needed (Dizziness,  nausea or vomiting).   senna (SENOKOT) 8.6 MG TABS tablet, Take 2 tablets (17.2 mg total) by mouth at bedtime. (Patient not taking: Reported on 12/16/2023)   sennosides-docusate sodium  (SENOKOT-S) 8.6-50 MG tablet, Take 1 tablet by mouth daily. (Patient not taking: Reported on 12/16/2023)  Current Facility-Administered Medications (Other):    sodium chloride  0.9 % bolus 1,000 mL  Medical History:  Past Medical History:  Diagnosis Date   Allergy    seasonal allergies   Anxiety    on meds   Asthma    uses inhaler   Breast cancer (HCC) 2012   RIGHT lumpectomy   Cancer (HCC) 2022   RIGHT breast lump-dx 2022   Depression    on meds   DVT (deep venous thrombosis) (HCC) 2010   after hysterectomy   Family history of uterine cancer    GERD (gastroesophageal reflux disease)    with certain foods/OTC PRN meds   Headache(784.0)    History of radiation therapy    Bilateral breast- 09/03/21-11/02/21- Dr. Lynwood Nasuti   Hyperlipidemia    on meds   Hypertension    on meds   SVT (supraventricular tachycardia) (HCC)    Allergies: No Known Allergies   Surgical History:  She  has a past surgical history that includes Abdominal hysterectomy; Breast surgery; Tubal ligation; Breast excisional biopsy  (Right, 09/16/2009); Portacath placement (Left, 01/26/2021); Breast lumpectomy with radioactive seed localization (Bilateral, 07/09/2021); Radioactive seed guided axillary sentinel lymph node (Right, 07/09/2021); Axillary sentinel node biopsy (Left, 07/09/2021); Port-a-cath removal (Left, 07/09/2021); Esophagogastroduodenoscopy (egd) with propofol  (N/A, 06/29/2022); biopsy (06/29/2022); Esophagogastroduodenoscopy (N/A, 12/13/2023); Balloon dilation (N/A, 12/13/2023); and Biopsy of skin subcutaneous tissue and/or mucous membrane (12/13/2023). Family History:  Her family history includes Breast cancer in her maternal aunt and mother; Breast cancer (age of onset: 14) in her cousin; Hypertension in her father; Ovarian cancer in her maternal aunt; Uterine cancer in her maternal aunt; Uterine cancer (age of onset: 47) in her cousin.  REVIEW OF SYSTEMS  : All other systems reviewed and negative except where noted in the History of Present Illness.  PHYSICAL EXAM: There were no vitals taken for this visit. Physical Exam          Alan JONELLE Coombs, PA-C 11:45 AM

## 2023-12-22 ENCOUNTER — Other Ambulatory Visit: Payer: Self-pay

## 2023-12-22 ENCOUNTER — Encounter: Payer: Self-pay | Admitting: Adult Health

## 2023-12-22 ENCOUNTER — Inpatient Hospital Stay: Payer: MEDICAID

## 2023-12-22 ENCOUNTER — Inpatient Hospital Stay: Payer: MEDICAID | Attending: Adult Health

## 2023-12-22 ENCOUNTER — Emergency Department (HOSPITAL_COMMUNITY): Payer: MEDICAID

## 2023-12-22 ENCOUNTER — Other Ambulatory Visit: Payer: Self-pay | Admitting: *Deleted

## 2023-12-22 ENCOUNTER — Ambulatory Visit: Payer: MEDICAID | Admitting: Physician Assistant

## 2023-12-22 ENCOUNTER — Encounter (HOSPITAL_COMMUNITY): Payer: Self-pay

## 2023-12-22 ENCOUNTER — Encounter: Payer: Self-pay | Admitting: *Deleted

## 2023-12-22 ENCOUNTER — Inpatient Hospital Stay (HOSPITAL_COMMUNITY)
Admission: EM | Admit: 2023-12-22 | Discharge: 2024-01-17 | DRG: 424 | Discharge status: Against Medical Advice | Payer: MEDICAID | Source: Ambulatory Visit | Attending: Internal Medicine | Admitting: Internal Medicine

## 2023-12-22 ENCOUNTER — Observation Stay (HOSPITAL_COMMUNITY): Payer: MEDICAID

## 2023-12-22 ENCOUNTER — Inpatient Hospital Stay: Payer: MEDICAID | Admitting: Adult Health

## 2023-12-22 ENCOUNTER — Telehealth: Payer: Self-pay | Admitting: *Deleted

## 2023-12-22 VITALS — BP 65/29 | HR 124 | Temp 97.7°F | Resp 20 | Wt 169.0 lb

## 2023-12-22 DIAGNOSIS — R112 Nausea with vomiting, unspecified: Secondary | ICD-10-CM | POA: Diagnosis not present

## 2023-12-22 DIAGNOSIS — Z5329 Procedure and treatment not carried out because of patient's decision for other reasons: Secondary | ICD-10-CM | POA: Diagnosis not present

## 2023-12-22 DIAGNOSIS — Z9221 Personal history of antineoplastic chemotherapy: Secondary | ICD-10-CM

## 2023-12-22 DIAGNOSIS — E669 Obesity, unspecified: Secondary | ICD-10-CM | POA: Diagnosis present

## 2023-12-22 DIAGNOSIS — R627 Adult failure to thrive: Secondary | ICD-10-CM | POA: Diagnosis present

## 2023-12-22 DIAGNOSIS — Z79899 Other long term (current) drug therapy: Secondary | ICD-10-CM

## 2023-12-22 DIAGNOSIS — N189 Chronic kidney disease, unspecified: Secondary | ICD-10-CM | POA: Diagnosis present

## 2023-12-22 DIAGNOSIS — R443 Hallucinations, unspecified: Secondary | ICD-10-CM | POA: Diagnosis not present

## 2023-12-22 DIAGNOSIS — K5730 Diverticulosis of large intestine without perforation or abscess without bleeding: Secondary | ICD-10-CM | POA: Diagnosis present

## 2023-12-22 DIAGNOSIS — I959 Hypotension, unspecified: Secondary | ICD-10-CM | POA: Diagnosis present

## 2023-12-22 DIAGNOSIS — K222 Esophageal obstruction: Secondary | ICD-10-CM | POA: Diagnosis present

## 2023-12-22 DIAGNOSIS — Z86718 Personal history of other venous thrombosis and embolism: Secondary | ICD-10-CM

## 2023-12-22 DIAGNOSIS — C7951 Secondary malignant neoplasm of bone: Secondary | ICD-10-CM | POA: Diagnosis present

## 2023-12-22 DIAGNOSIS — K869 Disease of pancreas, unspecified: Secondary | ICD-10-CM

## 2023-12-22 DIAGNOSIS — K660 Peritoneal adhesions (postprocedural) (postinfection): Secondary | ICD-10-CM | POA: Diagnosis present

## 2023-12-22 DIAGNOSIS — R748 Abnormal levels of other serum enzymes: Secondary | ICD-10-CM | POA: Diagnosis present

## 2023-12-22 DIAGNOSIS — C250 Malignant neoplasm of head of pancreas: Principal | ICD-10-CM | POA: Diagnosis present

## 2023-12-22 DIAGNOSIS — I129 Hypertensive chronic kidney disease with stage 1 through stage 4 chronic kidney disease, or unspecified chronic kidney disease: Secondary | ICD-10-CM | POA: Diagnosis present

## 2023-12-22 DIAGNOSIS — R7401 Elevation of levels of liver transaminase levels: Secondary | ICD-10-CM | POA: Diagnosis present

## 2023-12-22 DIAGNOSIS — E860 Dehydration: Secondary | ICD-10-CM | POA: Diagnosis present

## 2023-12-22 DIAGNOSIS — R7989 Other specified abnormal findings of blood chemistry: Secondary | ICD-10-CM | POA: Diagnosis present

## 2023-12-22 DIAGNOSIS — Z8249 Family history of ischemic heart disease and other diseases of the circulatory system: Secondary | ICD-10-CM

## 2023-12-22 DIAGNOSIS — E785 Hyperlipidemia, unspecified: Secondary | ICD-10-CM | POA: Diagnosis present

## 2023-12-22 DIAGNOSIS — N1831 Chronic kidney disease, stage 3a: Secondary | ICD-10-CM | POA: Diagnosis present

## 2023-12-22 DIAGNOSIS — Z803 Family history of malignant neoplasm of breast: Secondary | ICD-10-CM

## 2023-12-22 DIAGNOSIS — N179 Acute kidney failure, unspecified: Secondary | ICD-10-CM | POA: Diagnosis not present

## 2023-12-22 DIAGNOSIS — E861 Hypovolemia: Secondary | ICD-10-CM | POA: Diagnosis present

## 2023-12-22 DIAGNOSIS — Z86711 Personal history of pulmonary embolism: Secondary | ICD-10-CM

## 2023-12-22 DIAGNOSIS — E876 Hypokalemia: Secondary | ICD-10-CM | POA: Diagnosis not present

## 2023-12-22 DIAGNOSIS — R55 Syncope and collapse: Secondary | ICD-10-CM | POA: Diagnosis present

## 2023-12-22 DIAGNOSIS — R911 Solitary pulmonary nodule: Secondary | ICD-10-CM | POA: Diagnosis present

## 2023-12-22 DIAGNOSIS — R935 Abnormal findings on diagnostic imaging of other abdominal regions, including retroperitoneum: Secondary | ICD-10-CM

## 2023-12-22 DIAGNOSIS — K219 Gastro-esophageal reflux disease without esophagitis: Secondary | ICD-10-CM | POA: Diagnosis present

## 2023-12-22 DIAGNOSIS — C50411 Malignant neoplasm of upper-outer quadrant of right female breast: Secondary | ICD-10-CM

## 2023-12-22 DIAGNOSIS — E440 Moderate protein-calorie malnutrition: Secondary | ICD-10-CM | POA: Diagnosis not present

## 2023-12-22 DIAGNOSIS — J45909 Unspecified asthma, uncomplicated: Secondary | ICD-10-CM | POA: Diagnosis present

## 2023-12-22 DIAGNOSIS — G893 Neoplasm related pain (acute) (chronic): Secondary | ICD-10-CM | POA: Diagnosis present

## 2023-12-22 DIAGNOSIS — F419 Anxiety disorder, unspecified: Secondary | ICD-10-CM | POA: Diagnosis present

## 2023-12-22 DIAGNOSIS — Z8049 Family history of malignant neoplasm of other genital organs: Secondary | ICD-10-CM

## 2023-12-22 DIAGNOSIS — F32A Depression, unspecified: Secondary | ICD-10-CM | POA: Diagnosis present

## 2023-12-22 DIAGNOSIS — Z79811 Long term (current) use of aromatase inhibitors: Secondary | ICD-10-CM

## 2023-12-22 DIAGNOSIS — Z170 Estrogen receptor positive status [ER+]: Secondary | ICD-10-CM

## 2023-12-22 DIAGNOSIS — Z90710 Acquired absence of both cervix and uterus: Secondary | ICD-10-CM

## 2023-12-22 DIAGNOSIS — K2289 Other specified disease of esophagus: Secondary | ICD-10-CM | POA: Diagnosis present

## 2023-12-22 DIAGNOSIS — T402X5A Adverse effect of other opioids, initial encounter: Secondary | ICD-10-CM | POA: Diagnosis not present

## 2023-12-22 DIAGNOSIS — R531 Weakness: Secondary | ICD-10-CM

## 2023-12-22 DIAGNOSIS — Z923 Personal history of irradiation: Secondary | ICD-10-CM

## 2023-12-22 DIAGNOSIS — K209 Esophagitis, unspecified without bleeding: Secondary | ICD-10-CM | POA: Diagnosis present

## 2023-12-22 DIAGNOSIS — R9431 Abnormal electrocardiogram [ECG] [EKG]: Secondary | ICD-10-CM | POA: Diagnosis present

## 2023-12-22 DIAGNOSIS — K5900 Constipation, unspecified: Secondary | ICD-10-CM | POA: Diagnosis present

## 2023-12-22 DIAGNOSIS — Z8041 Family history of malignant neoplasm of ovary: Secondary | ICD-10-CM

## 2023-12-22 DIAGNOSIS — K3189 Other diseases of stomach and duodenum: Secondary | ICD-10-CM | POA: Diagnosis present

## 2023-12-22 DIAGNOSIS — R978 Other abnormal tumor markers: Secondary | ICD-10-CM

## 2023-12-22 DIAGNOSIS — E8809 Other disorders of plasma-protein metabolism, not elsewhere classified: Secondary | ICD-10-CM | POA: Diagnosis present

## 2023-12-22 DIAGNOSIS — K8689 Other specified diseases of pancreas: Secondary | ICD-10-CM | POA: Diagnosis present

## 2023-12-22 DIAGNOSIS — Z6821 Body mass index (BMI) 21.0-21.9, adult: Secondary | ICD-10-CM

## 2023-12-22 DIAGNOSIS — R634 Abnormal weight loss: Secondary | ICD-10-CM

## 2023-12-22 DIAGNOSIS — I471 Supraventricular tachycardia, unspecified: Secondary | ICD-10-CM | POA: Diagnosis present

## 2023-12-22 DIAGNOSIS — C50911 Malignant neoplasm of unspecified site of right female breast: Secondary | ICD-10-CM | POA: Diagnosis present

## 2023-12-22 DIAGNOSIS — K449 Diaphragmatic hernia without obstruction or gangrene: Secondary | ICD-10-CM | POA: Diagnosis present

## 2023-12-22 DIAGNOSIS — D631 Anemia in chronic kidney disease: Secondary | ICD-10-CM | POA: Diagnosis present

## 2023-12-22 DIAGNOSIS — B3781 Candidal esophagitis: Secondary | ICD-10-CM | POA: Diagnosis present

## 2023-12-22 DIAGNOSIS — R1084 Generalized abdominal pain: Secondary | ICD-10-CM

## 2023-12-22 DIAGNOSIS — K30 Functional dyspepsia: Secondary | ICD-10-CM | POA: Diagnosis present

## 2023-12-22 DIAGNOSIS — K315 Obstruction of duodenum: Secondary | ICD-10-CM | POA: Diagnosis present

## 2023-12-22 DIAGNOSIS — Z515 Encounter for palliative care: Secondary | ICD-10-CM

## 2023-12-22 DIAGNOSIS — G894 Chronic pain syndrome: Secondary | ICD-10-CM | POA: Diagnosis present

## 2023-12-22 LAB — CMP (CANCER CENTER ONLY)
ALT: 334 U/L (ref 0–44)
AST: 146 U/L — ABNORMAL HIGH (ref 15–41)
Albumin: 4.6 g/dL (ref 3.5–5.0)
Alkaline Phosphatase: 378 U/L — ABNORMAL HIGH (ref 38–126)
Anion gap: 20 — ABNORMAL HIGH (ref 5–15)
BUN: 46 mg/dL — ABNORMAL HIGH (ref 6–20)
CO2: 27 mmol/L (ref 22–32)
Calcium: 8.2 mg/dL — ABNORMAL LOW (ref 8.9–10.3)
Chloride: 89 mmol/L — ABNORMAL LOW (ref 98–111)
Creatinine: 3.25 mg/dL — ABNORMAL HIGH (ref 0.44–1.00)
GFR, Estimated: 17 mL/min — ABNORMAL LOW (ref >60)
Glucose, Bld: 134 mg/dL — ABNORMAL HIGH (ref 70–99)
Potassium: 3 mmol/L — ABNORMAL LOW (ref 3.5–5.1)
Sodium: 136 mmol/L (ref 135–145)
Total Bilirubin: 0.9 mg/dL (ref 0.0–1.2)
Total Protein: 9.1 g/dL — ABNORMAL HIGH (ref 6.5–8.1)

## 2023-12-22 LAB — CBC WITH DIFFERENTIAL/PLATELET
Abs Immature Granulocytes: 0.24 10*3/uL — ABNORMAL HIGH (ref 0.00–0.07)
Basophils Absolute: 0 10*3/uL (ref 0.0–0.1)
Basophils Relative: 1 %
Eosinophils Absolute: 0 10*3/uL (ref 0.0–0.5)
Eosinophils Relative: 0 %
HCT: 38.8 % (ref 36.0–46.0)
Hemoglobin: 12.1 g/dL (ref 12.0–15.0)
Immature Granulocytes: 3 %
Lymphocytes Relative: 14 %
Lymphs Abs: 1 10*3/uL (ref 0.7–4.0)
MCH: 22.9 pg — ABNORMAL LOW (ref 26.0–34.0)
MCHC: 31.2 g/dL (ref 30.0–36.0)
MCV: 73.5 fL — ABNORMAL LOW (ref 80.0–100.0)
Monocytes Absolute: 0.5 10*3/uL (ref 0.1–1.0)
Monocytes Relative: 8 %
Neutro Abs: 5.2 10*3/uL (ref 1.7–7.7)
Neutrophils Relative %: 74 %
Platelets: 344 10*3/uL (ref 150–400)
RBC: 5.28 MIL/uL — ABNORMAL HIGH (ref 3.87–5.11)
RDW: 14.6 % (ref 11.5–15.5)
WBC: 7.1 10*3/uL (ref 4.0–10.5)
nRBC: 0 % (ref 0.0–0.2)

## 2023-12-22 LAB — CBC WITH DIFFERENTIAL (CANCER CENTER ONLY)
Abs Immature Granulocytes: 0.14 10*3/uL — ABNORMAL HIGH (ref 0.00–0.07)
Basophils Absolute: 0.1 10*3/uL (ref 0.0–0.1)
Basophils Relative: 1 %
Eosinophils Absolute: 0 10*3/uL (ref 0.0–0.5)
Eosinophils Relative: 1 %
HCT: 38.8 % (ref 36.0–46.0)
Hemoglobin: 12.7 g/dL (ref 12.0–15.0)
Immature Granulocytes: 2 %
Lymphocytes Relative: 21 %
Lymphs Abs: 1.3 10*3/uL (ref 0.7–4.0)
MCH: 23 pg — ABNORMAL LOW (ref 26.0–34.0)
MCHC: 32.7 g/dL (ref 30.0–36.0)
MCV: 70.3 fL — ABNORMAL LOW (ref 80.0–100.0)
Monocytes Absolute: 0.6 10*3/uL (ref 0.1–1.0)
Monocytes Relative: 9 %
Neutro Abs: 4.1 10*3/uL (ref 1.7–7.7)
Neutrophils Relative %: 66 %
Platelet Count: 352 10*3/uL (ref 150–400)
RBC: 5.52 MIL/uL — ABNORMAL HIGH (ref 3.87–5.11)
RDW: 14 % (ref 11.5–15.5)
WBC Count: 6.2 10*3/uL (ref 4.0–10.5)
nRBC: 0 % (ref 0.0–0.2)

## 2023-12-22 LAB — BASIC METABOLIC PANEL WITH GFR
Anion gap: 15 (ref 5–15)
BUN: 44 mg/dL — ABNORMAL HIGH (ref 6–20)
CO2: 21 mmol/L — ABNORMAL LOW (ref 22–32)
Calcium: 6.5 mg/dL — ABNORMAL LOW (ref 8.9–10.3)
Chloride: 99 mmol/L (ref 98–111)
Creatinine, Ser: 2.2 mg/dL — ABNORMAL HIGH (ref 0.44–1.00)
GFR, Estimated: 26 mL/min — ABNORMAL LOW (ref >60)
Glucose, Bld: 115 mg/dL — ABNORMAL HIGH (ref 70–99)
Potassium: 3 mmol/L — ABNORMAL LOW (ref 3.5–5.1)
Sodium: 135 mmol/L (ref 135–145)

## 2023-12-22 LAB — COMPREHENSIVE METABOLIC PANEL WITH GFR
ALT: 307 U/L — ABNORMAL HIGH (ref 0–44)
AST: 139 U/L — ABNORMAL HIGH (ref 15–41)
Albumin: 4.1 g/dL (ref 3.5–5.0)
Alkaline Phosphatase: 351 U/L — ABNORMAL HIGH (ref 38–126)
Anion gap: 19 — ABNORMAL HIGH (ref 5–15)
BUN: 53 mg/dL — ABNORMAL HIGH (ref 6–20)
CO2: 26 mmol/L (ref 22–32)
Calcium: 7 mg/dL — ABNORMAL LOW (ref 8.9–10.3)
Chloride: 91 mmol/L — ABNORMAL LOW (ref 98–111)
Creatinine, Ser: 3.23 mg/dL — ABNORMAL HIGH (ref 0.44–1.00)
GFR, Estimated: 17 mL/min — ABNORMAL LOW (ref >60)
Glucose, Bld: 143 mg/dL — ABNORMAL HIGH (ref 70–99)
Potassium: 2.9 mmol/L — ABNORMAL LOW (ref 3.5–5.1)
Sodium: 136 mmol/L (ref 135–145)
Total Bilirubin: 1.1 mg/dL (ref 0.0–1.2)
Total Protein: 9.1 g/dL — ABNORMAL HIGH (ref 6.5–8.1)

## 2023-12-22 LAB — PROTIME-INR
INR: 1.2 (ref 0.8–1.2)
Prothrombin Time: 15.5 s — ABNORMAL HIGH (ref 11.4–15.2)

## 2023-12-22 LAB — MAGNESIUM: Magnesium: 2.8 mg/dL — ABNORMAL HIGH (ref 1.7–2.4)

## 2023-12-22 LAB — LIPASE, BLOOD: Lipase: 52 U/L — ABNORMAL HIGH (ref 11–51)

## 2023-12-22 LAB — I-STAT CHEM 8, ED
BUN: 55 mg/dL — ABNORMAL HIGH (ref 6–20)
Calcium, Ion: 0.79 mmol/L — CL (ref 1.15–1.40)
Chloride: 94 mmol/L — ABNORMAL LOW (ref 98–111)
Creatinine, Ser: 3.2 mg/dL — ABNORMAL HIGH (ref 0.44–1.00)
Glucose, Bld: 141 mg/dL — ABNORMAL HIGH (ref 70–99)
HCT: 39 % (ref 36.0–46.0)
Hemoglobin: 13.3 g/dL (ref 12.0–15.0)
Potassium: 3.1 mmol/L — ABNORMAL LOW (ref 3.5–5.1)
Sodium: 135 mmol/L (ref 135–145)
TCO2: 28 mmol/L (ref 22–32)

## 2023-12-22 LAB — I-STAT CG4 LACTIC ACID, ED: Lactic Acid, Venous: 1.6 mmol/L (ref 0.5–1.9)

## 2023-12-22 LAB — PHOSPHORUS: Phosphorus: 3.5 mg/dL (ref 2.5–4.6)

## 2023-12-22 LAB — ACETAMINOPHEN LEVEL: Acetaminophen (Tylenol), Serum: <10 ug/mL — ABNORMAL LOW (ref 10–30)

## 2023-12-22 LAB — CK: Total CK: 61 U/L (ref 38–234)

## 2023-12-22 MED ORDER — POTASSIUM CHLORIDE 10 MEQ/100ML IV SOLN
10.0000 meq | Freq: Once | INTRAVENOUS | Status: AC
  Administered 2023-12-22: 10 meq via INTRAVENOUS
  Filled 2023-12-22: qty 100

## 2023-12-22 MED ORDER — THIAMINE HCL 100 MG/ML IJ SOLN
100.0000 mg | Freq: Every day | INTRAMUSCULAR | Status: DC
  Administered 2023-12-22 – 2023-12-29 (×7): 100 mg via INTRAVENOUS
  Filled 2023-12-22 (×7): qty 2

## 2023-12-22 MED ORDER — ONDANSETRON HCL 4 MG/2ML IJ SOLN
4.0000 mg | Freq: Once | INTRAMUSCULAR | Status: AC
  Administered 2023-12-22: 4 mg via INTRAVENOUS
  Filled 2023-12-22: qty 2

## 2023-12-22 MED ORDER — ONDANSETRON HCL 4 MG/2ML IJ SOLN
4.0000 mg | Freq: Four times a day (QID) | INTRAMUSCULAR | Status: DC | PRN
  Administered 2023-12-23 – 2024-01-08 (×26): 4 mg via INTRAVENOUS
  Filled 2023-12-22 (×25): qty 2

## 2023-12-22 MED ORDER — FENTANYL CITRATE PF 50 MCG/ML IJ SOSY
12.5000 ug | PREFILLED_SYRINGE | INTRAMUSCULAR | Status: DC | PRN
  Administered 2023-12-23 (×2): 50 ug via INTRAVENOUS
  Administered 2023-12-23: 12.5 ug via INTRAVENOUS
  Administered 2023-12-30 – 2024-01-01 (×12): 50 ug via INTRAVENOUS
  Filled 2023-12-22 (×15): qty 1

## 2023-12-22 MED ORDER — POTASSIUM CHLORIDE 10 MEQ/100ML IV SOLN
10.0000 meq | INTRAVENOUS | Status: AC
  Administered 2023-12-23 (×4): 10 meq via INTRAVENOUS
  Filled 2023-12-22 (×4): qty 100

## 2023-12-22 MED ORDER — MONTELUKAST SODIUM 10 MG PO TABS
10.0000 mg | ORAL_TABLET | Freq: Every day | ORAL | Status: DC
  Administered 2023-12-23: 10 mg via ORAL
  Filled 2023-12-22 (×3): qty 1

## 2023-12-22 MED ORDER — SODIUM CHLORIDE 0.9 % IV SOLN: Freq: Once | INTRAVENOUS | Status: AC

## 2023-12-22 MED ORDER — SODIUM CHLORIDE 0.9 % IV BOLUS
1000.0000 mL | Freq: Once | INTRAVENOUS | Status: AC
  Administered 2023-12-22: 1000 mL via INTRAVENOUS

## 2023-12-22 MED ORDER — PANTOPRAZOLE SODIUM 40 MG IV SOLR
40.0000 mg | Freq: Two times a day (BID) | INTRAVENOUS | Status: DC
  Administered 2023-12-22 – 2024-01-17 (×50): 40 mg via INTRAVENOUS
  Filled 2023-12-22 (×50): qty 10

## 2023-12-22 MED ORDER — ALBUTEROL SULFATE HFA 108 (90 BASE) MCG/ACT IN AERS: 2.0000 | INHALATION_SPRAY | Freq: Four times a day (QID) | RESPIRATORY_TRACT | Status: DC | PRN

## 2023-12-22 MED ORDER — SODIUM CHLORIDE 0.9 % IV SOLN: INTRAVENOUS | Status: DC

## 2023-12-22 MED ORDER — CALCIUM GLUCONATE-NACL 1-0.675 GM/50ML-% IV SOLN
1.0000 g | Freq: Once | INTRAVENOUS | Status: AC
  Administered 2023-12-22: 1000 mg via INTRAVENOUS
  Filled 2023-12-22: qty 50

## 2023-12-22 MED ORDER — MILK AND MOLASSES ENEMA: 1.0000 | Freq: Once | RECTAL | Status: DC | PRN

## 2023-12-22 MED ORDER — ALBUTEROL SULFATE (2.5 MG/3ML) 0.083% IN NEBU: 2.5000 mg | INHALATION_SOLUTION | Freq: Four times a day (QID) | RESPIRATORY_TRACT | Status: DC | PRN

## 2023-12-22 MED ORDER — POLYETHYLENE GLYCOL 3350 17 G PO PACK
17.0000 g | PACK | Freq: Every day | ORAL | Status: DC
  Administered 2023-12-22 – 2023-12-23 (×2): 17 g via ORAL
  Filled 2023-12-22 (×8): qty 1

## 2023-12-22 MED ORDER — BISACODYL 10 MG RE SUPP
10.0000 mg | Freq: Once | RECTAL | Status: AC
  Administered 2023-12-22: 10 mg via RECTAL
  Filled 2023-12-22: qty 1

## 2023-12-22 MED ORDER — METOCLOPRAMIDE HCL 5 MG/ML IJ SOLN
5.0000 mg | Freq: Four times a day (QID) | INTRAMUSCULAR | Status: DC | PRN
  Administered 2023-12-22 – 2023-12-29 (×12): 5 mg via INTRAVENOUS
  Filled 2023-12-22 (×12): qty 2

## 2023-12-22 MED ORDER — ACETAMINOPHEN 650 MG RE SUPP: 650.0000 mg | Freq: Four times a day (QID) | RECTAL | Status: DC | PRN

## 2023-12-22 MED ORDER — FLUCONAZOLE IN SODIUM CHLORIDE 200-0.9 MG/100ML-% IV SOLN
200.0000 mg | INTRAVENOUS | Status: DC
  Administered 2023-12-22 – 2024-01-02 (×12): 200 mg via INTRAVENOUS
  Filled 2023-12-22 (×12): qty 100

## 2023-12-22 MED ORDER — CALCIUM GLUCONATE-NACL 1-0.675 GM/50ML-% IV SOLN
1.0000 g | Freq: Once | INTRAVENOUS | Status: AC
  Administered 2023-12-23: 1000 mg via INTRAVENOUS
  Filled 2023-12-22: qty 50

## 2023-12-22 MED ORDER — ACETAMINOPHEN 325 MG PO TABS
650.0000 mg | ORAL_TABLET | Freq: Four times a day (QID) | ORAL | Status: DC | PRN
Start: 2023-12-22 — End: 2024-01-02

## 2023-12-22 MED ORDER — POLYETHYLENE GLYCOL 3350 17 G PO PACK
17.0000 g | PACK | Freq: Two times a day (BID) | ORAL | Status: DC | PRN
  Filled 2023-12-22: qty 1

## 2023-12-22 MED ORDER — GABAPENTIN 300 MG PO CAPS
300.0000 mg | ORAL_CAPSULE | Freq: Three times a day (TID) | ORAL | Status: DC
  Filled 2023-12-22 (×10): qty 1

## 2023-12-22 MED ORDER — FAMOTIDINE IN NACL 20-0.9 MG/50ML-% IV SOLN
20.0000 mg | Freq: Once | INTRAVENOUS | Status: AC
  Administered 2023-12-22: 20 mg via INTRAVENOUS
  Filled 2023-12-22: qty 50

## 2023-12-22 NOTE — Assessment & Plan Note (Signed)
 In the setting of dehydration will rehydrate hold propranolol 

## 2023-12-22 NOTE — Assessment & Plan Note (Signed)
-  will monitor on tele avoid QT prolonging medications, rehydrate correct electrolytes ? ?

## 2023-12-22 NOTE — ED Triage Notes (Addendum)
 Pt brought over from the cancer center with complaints of nausea, vomiting, abdominal pain, and per cancer center hypotension. Pt has not been able to keep food or drink down. Stated she did an enema on Monday and only had a small amount of output. Also stated she has lost almost 20lbs since last Thursday.  Was given 1L if NS.  On arrival to ED  Bp is 109/76

## 2023-12-22 NOTE — H&P (Addendum)
 Chelsea Hansen FMW:[id number] DOB: 02-01-1971 DOA: 12/22/2023     PCP: [name], NP   Outpatient Specialists:  CARDS:  Dr. Dub Huntsman, DO    Oncology   Dr. Odean  GI Dr.  Federico,  ( [facility])  Patient arrived to ER on 12/22/23 at 1504 Referred by Attending Silvester Ales, MD   Patient coming from:    home Lives  With family      Chief Complaint:  nausea /vomiting Chief Complaint  Patient presents with   Abdominal Pain   Emesis    HPI: Chelsea Hansen is a 53 y.o. female with medical history significant of breast cancer, Duodenal stenosis , esophagitis Constipation, HLD, HTN , anemia, DVT, asthma    Presented with   presyncope Has been having nausea and vomiting for about 1 month, followed by [facility] Has been very weak not tolerating PO not keeping her meds down  Her BP today has been low at cancer center 65/39 feeling presyncopal Has been losing weight   Last BM after enema 4 days ago Patient states she is having persistent nausea and vomiting At least 3 episodes yesterday states this is not brought in by eating can occur at rest she is not able to keep her meds down today she went to hematology oncology clinic where she was noted to be hypotensive and elevated LFTs with low potassium levels they have attempted to give multiple potassium infusions as first patient was reluctant to go to emergency department but finally agreed.  She has past history of esophagitis esophageal and duodenal strictures requiring dilatations as well as Candida esophagitis which supposed to be on fluconazole  but cannot tolerate because of nausea and vomiting  Of note patient has been having intermittent right side weakness that has been persistent for past month she has undergone MRI brain without contrast on 16 June which was nonacute  Denies significant ETOH intake   Does not smoke   Denies marijuana use      Regarding pertinent Chronic problems:    Hyperlipidemia -  not on statins    Lipid Panel     Component Value Date/Time   CHOL 226 (H) 10/06/2020 1432   TRIG 196 (H) 10/06/2020 1432   HDL 51 10/06/2020 1432   CHOLHDL 4.4 10/06/2020 1432   CHOLHDL 3.1 Ratio 09/05/2009 2118   VLDL 29 09/05/2009 2118   LDLCALC 140 (H) 10/06/2020 1432   LABVLDL 35 10/06/2020 1432     HTN on propranolol    last echo  Recent Results (from the past 56199 hours)  ECHOCARDIOGRAM COMPLETE   Collection Time: 01/22/21  1:38 PM  Result Value   Height 71   BP 121/77   S' Lateral 3.30   Area-P 1/2 3.16   Narrative      ECHOCARDIOGRAM REPORT       1. Left ventricular ejection fraction, by estimation, is 60 to 65%. The left ventricle has normal function. The left ventricle has no regional wall motion abnormalities. Left ventricular diastolic parameters were normal.  2. Right ventricular systolic function is normal. The right ventricular size is normal.  3. Left atrial size was mildly dilated.  4. The mitral valve is normal in structure. Trivial mitral valve regurgitation. No evidence of mitral stenosis.  5. The aortic valve was not well visualized. There is mild calcification of the aortic valve. Aortic valve regurgitation is not visualized. No aortic stenosis is present.  6. The inferior vena cava is normal in  size with greater than 50% respiratory variability, suggesting right atrial pressure of 3 mmHg.            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  Lab Results  Component Value Date   CREATININE 3.20 (H) 12/22/2023   CREATININE 3.23 (H) 12/22/2023   CREATININE 3.25 (H) 12/22/2023   Lab Results  Component Value Date   NA 135 12/22/2023   CL 94 (L) 12/22/2023   K 3.1 (L) 12/22/2023   CO2 26 12/22/2023   BUN 55 (H) 12/22/2023   CREATININE 3.20 (H) 12/22/2023    GFRNONAA 17 (L) 12/22/2023   CALCIUM  7.0 (L) 12/22/2023   PHOS 3.5 12/22/2023   ALBUMIN 4.1 12/22/2023   GLUCOSE 141 (H) 12/22/2023          Chronic anemia - baseline hg Hemoglobin & Hematocrit  Recent Labs    12/22/23 0957 12/22/23 1553 12/22/23 1608  HGB 12.7 12.1 13.3   Iron/TIBC/Ferritin/ %Sat    Component Value Date/Time   IRON 91 02/19/2022 1324   TIBC 321 02/19/2022 1324   FERRITIN 78 02/19/2022 1324   IRONPCTSAT 28 02/19/2022 1324     Cancer: breast cancer with mets to the bone     While in ER:   Noted to have hypercalcemia hypokalemia elevated LFTs CT not explaining cause for ongoing nausea vomiting    Lab Orders         CBC with Differential         Comprehensive metabolic panel         Urinalysis, w/ Reflex to Culture (Infection Suspected) -Urine, Clean Catch         Magnesium          Phosphorus         Protime-INR         CK         I-stat chem 8, ED       CTabd/pelvis chest - . Persistent vague fat stranding along the proximal pancreas with query underlying vague mass.   Persistent scattered axial and appendicular sclerotic osseous metastatic lesions. Colonic diverticulosis with no acute diverticulitis.     Following Medications were ordered in ER: Medications  calcium  gluconate 1 g/ 50 mL sodium chloride  IVPB (1,000 mg Intravenous New Bag/Given 12/22/23 1842)  sodium chloride  0.9 % bolus 1,000 mL (0 mLs Intravenous Stopped 12/22/23 1843)  ondansetron  (ZOFRAN ) injection 4 mg (4 mg Intravenous Given 12/22/23 1555)  potassium chloride  10 mEq in 100 mL IVPB (0 mEq Intravenous Stopped 12/22/23 1838)  0.9 %  sodium chloride  infusion ( Intravenous New Bag/Given 12/22/23 1841)        ED Triage Vitals  Encounter Vitals Group     BP 12/22/23 1505 109/76     Girls Systolic BP Percentile --      Girls Diastolic BP Percentile --      Boys Systolic BP Percentile --      Boys Diastolic BP Percentile --      Pulse Rate 12/22/23 1505 94     Resp 12/22/23  1505 19     Temp 12/22/23 1505 97.6 F (36.4  C)     Temp Source 12/22/23 1505 Oral     SpO2 12/22/23 1505 98 %     Weight 12/22/23 1522 169 lb (76.7 kg)     Height 12/22/23 1522 5' 11 (1.803 m)     Head Circumference --      Peak Flow --      Pain Score 12/22/23 1521 9     Pain Loc --      Pain Education --      Exclude from Growth Chart --   UFJK(75)@     _________________________________________ Significant initial  Findings: Abnormal Labs Reviewed  CBC WITH DIFFERENTIAL/PLATELET - Abnormal; Notable for the following components:      Result Value   RBC 5.28 (*)    MCV 73.5 (*)    MCH 22.9 (*)    Abs Immature Granulocytes 0.24 (*)    All other components within normal limits  COMPREHENSIVE METABOLIC PANEL WITH GFR - Abnormal; Notable for the following components:   Potassium 2.9 (*)    Chloride 91 (*)    Glucose, Bld 143 (*)    BUN 53 (*)    Creatinine, Ser 3.23 (*)    Calcium  7.0 (*)    Total Protein 9.1 (*)    AST 139 (*)    ALT 307 (*)    Alkaline Phosphatase 351 (*)    GFR, Estimated 17 (*)    Anion gap 19 (*)    All other components within normal limits  MAGNESIUM  - Abnormal; Notable for the following components:   Magnesium  2.8 (*)    All other components within normal limits  I-STAT CHEM 8, ED - Abnormal; Notable for the following components:   Potassium 3.1 (*)    Chloride 94 (*)    BUN 55 (*)    Creatinine, Ser 3.20 (*)    Glucose, Bld 141 (*)    Calcium , Ion 0.79 (*)    All other components within normal limits     ECG: Ordered Personally reviewed and interpreted by me showing: HR : 88 Rhythm:Sinus rhythm Prolonged QT interval QTC 555    The recent clinical data is shown below. Vitals:   12/22/23 1522 12/22/23 1800 12/22/23 1830 12/22/23 1850  BP:  111/73 92/79   Pulse:  79 77   Resp:  12 17   Temp:    97.8 F (36.6 C)  TempSrc:    Oral  SpO2:  100% 98%   Weight: 76.7 kg     Height: 5' 11 (1.803 m)       WBC     Component Value  Date/Time   WBC 7.1 12/22/2023 1553   LYMPHSABS 1.0 12/22/2023 1553   LYMPHSABS 1.9 10/06/2020 1432   MONOABS 0.5 12/22/2023 1553   EOSABS 0.0 12/22/2023 1553   EOSABS 0.1 10/06/2020 1432   BASOSABS 0.0 12/22/2023 1553   BASOSABS 0.0 10/06/2020 1432    Lactic Acid, Venous    Component Value Date/Time   LATICACIDVEN 1.6 12/22/2023 1608       UA   ordered    __________________________________________________________ Recent Labs  Lab 12/22/23 0957 12/22/23 1553 12/22/23 1608  NA 136 136 135  K 3.0* 2.9* 3.1*  CO2 27 26  --   GLUCOSE 134* 143* 141*  BUN 46* 53* 55*  CREATININE 3.25* 3.23* 3.20*  CALCIUM  8.2* 7.0*  --   MG  --  2.8*  --   PHOS  --  3.5  --  Cr     Up from baseline see below Lab Results  Component Value Date   CREATININE 3.20 (H) 12/22/2023   CREATININE 3.23 (H) 12/22/2023   CREATININE 3.25 (H) 12/22/2023    Recent Labs  Lab 12/22/23 0957 12/22/23 1553  AST 146* 139*  ALT 334* 307*  ALKPHOS 378* 351*  BILITOT 0.9 1.1  PROT 9.1* 9.1*  ALBUMIN 4.6 4.1   Lab Results  Component Value Date   CALCIUM  7.0 (L) 12/22/2023   PHOS 3.5 12/22/2023       Plt: Lab Results  Component Value Date   PLT 344 12/22/2023       Recent Labs  Lab 12/22/23 0957 12/22/23 1553 12/22/23 1608  WBC 6.2 7.1  --   NEUTROABS 4.1 5.2  --   HGB 12.7 12.1 13.3  HCT 38.8 38.8 39.0  MCV 70.3* 73.5*  --   PLT 352 344  --     HG/HCT  Up from baseline see below    Component Value Date/Time   HGB 13.3 12/22/2023 1608   HGB 12.7 12/22/2023 0957   HGB 11.5 10/06/2020 1432   HCT 39.0 12/22/2023 1608   HCT 36.8 10/06/2020 1432   MCV 73.5 (L) 12/22/2023 1553   MCV 73 (L) 10/06/2020 1432     _______________________________________________ Hospitalist was called for admission for   AKI   Nausea and vomiting, unspecified vomiting type    The following Work up has been ordered so far:  Orders Placed This Encounter  Procedures   CT CHEST ABDOMEN PELVIS WO  CONTRAST   CBC with Differential   Comprehensive metabolic panel   Urinalysis, w/ Reflex to Culture (Infection Suspected) -Urine, Clean Catch   Magnesium    Phosphorus   Protime-INR   CK   ED Cardiac monitoring   Cardiac Monitoring - Continuous Indefinite   Consult to hospitalist   I-stat chem 8, ED   ED EKG   Saline lock IV   Place in observation (patient's expected length of stay will be less than 2 midnights)     OTHER Significant initial  Findings:  labs showing:     DM  labs:  HbA1C: No results for input(s): HGBA1C in the last 8760 hours.     CBG (last 3)  No results for input(s): GLUCAP in the last 72 hours.        Cultures:    Component Value Date/Time   SDES BLOOD RIGHT ANTECUBITAL 12/10/2023 2048   SPECREQUEST  12/10/2023 2048    BOTTLES DRAWN AEROBIC AND ANAEROBIC Blood Culture adequate volume   CULT  12/10/2023 2048    NO GROWTH 5 DAYS Performed at [facility], [address] [place], [place] [address]    REPTSTATUS 12/15/2023 FINAL 12/10/2023 2048     Radiological Exams on Admission: CT CHEST ABDOMEN PELVIS WO CONTRAST Result Date: 12/22/2023 CLINICAL DATA:  Bowel obstruction suspected nausea, vomiting, abdominal pain, and per cancer center hypotension. Pt has not been able to keep food or drink down. Stated she did an enema on Monday and only had a small amount of output. Also stated she has lost almost 20lbs EXAM: CT CHEST, ABDOMEN AND PELVIS WITHOUT CONTRAST TECHNIQUE: Multidetector CT imaging of the chest, abdomen and pelvis was performed following the standard protocol without IV contrast. RADIATION DOSE REDUCTION: This exam was performed according to the departmental dose-optimization program which includes automated exposure control, adjustment of the mA and/or kV according to patient size and/or use of iterative reconstruction  technique. COMPARISON:  CT chest 12/12/2023, CT abdomen pelvis 12/10/2023, PET CT 11/23/2023, CT abdomen pelvis  11/17/2022 FINDINGS: CT CHEST FINDINGS Cardiovascular: Normal heart size. No significant pericardial effusion. The thoracic aorta is normal in caliber. No atherosclerotic plaque of the thoracic aorta. No coronary artery calcifications. Mediastinum/Nodes: No gross hilar adenopathy, noting limited sensitivity for the detection of hilar adenopathy on this noncontrast study. No enlarged mediastinal or axillary lymph nodes. Thyroid  gland, trachea, and esophagus demonstrate no significant findings. Lungs/Pleura: Similar-appearing bilateral upper lung zone, right greater than left, centrilobular pulmonary micronodules. No pulmonary mass. No pleural effusion. No pneumothorax. Musculoskeletal: Postsurgical changes of the bilateral breasts. No suspicious lytic or blastic osseous lesions. No acute displaced fracture. Multilevel degenerative changes of the spine. CT ABDOMEN PELVIS FINDINGS Hepatobiliary: No focal liver abnormality. No gallstones, gallbladder wall thickening, or pericholecystic fluid. No biliary dilatation. Pancreas: Persistent vague fat stranding along the proximal pancreas with query underlying vague mass (2:68). No surrounding inflammatory changes. No main pancreatic ductal dilatation. Spleen: Normal in size without focal abnormality. Adrenals/Urinary Tract: No adrenal nodule bilaterally. No nephrolithiasis and no hydronephrosis. No definite contour-deforming renal mass. No ureterolithiasis or hydroureter. The urinary bladder is unremarkable. Stomach/Bowel: Stomach is within normal limits. No evidence of bowel wall thickening or dilatation. Appendix appears normal. Vascular/Lymphatic: No abdominal aorta or iliac aneurysm. No abdominal, pelvic, or inguinal lymphadenopathy. Reproductive: Status post hysterectomy. No adnexal masses. Other: No intraperitoneal free fluid. No intraperitoneal free gas. No organized fluid collection. Musculoskeletal: No abdominal wall hernia or abnormality. Persistent scattered axial  and appendicular sclerotic osseous metastatic lesions. L3 vertebral body hemangioma with stable chronic vertebral body height loss. No acute displaced fracture. Multilevel degenerative changes of the spine. IMPRESSION: 1. Limited evaluation on this noncontrast study. 2. Similar-appearing bilateral upper lung zone, right greater than left, centrilobular pulmonary micronodules. Finding may represent infection/inflammation. Underlying malignancy not excluded. 3. Persistent vague fat stranding along the proximal pancreas with query underlying vague mass. Correlate with lipase levels. If clinically indicated, consider MRI pancreatic protocol for further evaluation. 4. Persistent scattered axial and appendicular sclerotic osseous metastatic lesions. 5. Colonic diverticulosis with no acute diverticulitis. Electronically Signed   By: Morgane  Naveau M.D.   On: 12/22/2023 17:55   _______________________________________________________________________________________________________ Latest  Blood pressure 92/79, pulse 77, temperature 97.8 F (36.6 C), temperature source Oral, resp. rate 17, height 5' 11 (1.803 m), weight 76.7 kg, SpO2 98%.   Vitals  labs and radiology finding personally reviewed  Review of Systems:    Pertinent positives include:    abdominal pain, nausea, vomiting, Constitutional:  No weight loss, night sweats, Fevers, chills, fatigue, weight loss  HEENT:  No headaches, Difficulty swallowing,Tooth/dental problems,Sore throat,  No sneezing, itching, ear ache, nasal congestion, post nasal drip,  Cardio-vascular:  No chest pain, Orthopnea, PND, anasarca, dizziness, palpitations.no Bilateral lower extremity swelling  GI:  No heartburn, indigestion, diarrhea, change in bowel habits, loss of appetite, melena, blood in stool, hematemesis Resp:  no shortness of breath at rest. No dyspnea on exertion, No excess mucus, no productive cough, No non-productive cough, No coughing up of blood.No  change in color of mucus.No wheezing. Skin:  no rash or lesions. No jaundice GU:  no dysuria, change in color of urine, no urgency or frequency. No straining to urinate.  No flank pain.  Musculoskeletal:  No joint pain or no joint swelling. No decreased range of motion. No back pain.  Psych:  No change in mood or affect. No depression or anxiety. No memory loss.  Neuro: no localizing neurological complaints, no tingling, no weakness, no double vision, no gait abnormality, no slurred speech, no confusion  All systems reviewed and apart from HOPI all are negative _______________________________________________________________________________________________ Past Medical History:   Past Medical History:  Diagnosis Date   Allergy    seasonal allergies   Anxiety    on meds   Asthma    uses inhaler   Breast cancer (HCC) 2012   RIGHT lumpectomy   Cancer (HCC) 2022   RIGHT breast lump-dx 2022   Depression    on meds   DVT (deep venous thrombosis) (HCC) 2010   after hysterectomy   Family history of uterine cancer    GERD (gastroesophageal reflux disease)    with certain foods/OTC PRN meds   Headache(784.0)    History of radiation therapy    Bilateral breast- 09/03/21-11/02/21- Dr. Lynwood Nasuti   Hyperlipidemia    on meds   Hypertension    on meds   SVT (supraventricular tachycardia) (HCC)       Past Surgical History:  Procedure Laterality Date   ABDOMINAL HYSTERECTOMY     AXILLARY SENTINEL NODE BIOPSY Left 07/09/2021   Procedure: LEFT AXILLARY SENTINEL NODE BIOPSY;  Surgeon: [name], MD;  Location: [facility];  Service: General;  Laterality: Left;   BALLOON DILATION N/A 12/13/2023   Procedure: MERRILL HODGKIN;  Surgeon: Federico Rosario BROCKS, MD;  Location: [facility]COPY;  Service: Gastroenterology;  Laterality: N/A;   BIOPSY  06/29/2022   Procedure: BIOPSY;  Surgeon: Legrand Victory LITTIE DOUGLAS, MD;  Location: [facility] END[facility]COPY;  Service: Gastroenterology;;   BIOPSY OF  SKIN SUBCUTANEOUS TISSUE AND/OR MUCOUS MEMBRANE  12/13/2023   Procedure: BIOPSY, SKIN, SUBCUTANEOUS TISSUE, OR MUCOUS MEMBRANE;  Surgeon: Federico Rosario BROCKS, MD;  Location: [facility] ENDOSCOPY;  Service: Gastroenterology;;   BREAST EXCISIONAL BIOPSY Right 09/16/2009   BREAST LUMPECTOMY WITH RADIOACTIVE SEED LOCALIZATION Bilateral 07/09/2021   Procedure: BILATERAL BREAST LUMPECTOMY WITH RADIOACTIVE SEED LOCALIZATION;  Surgeon: [name], MD;  Location: [facility];  Service: General;  Laterality: Bilateral;   BREAST SURGERY     lumpectomy   ESOPHAGOGASTRODUODENOSCOPY N/A 12/13/2023   Procedure: EGD (ESOPHAGOGASTRODUODENOSCOPY);  Surgeon: Federico Rosario BROCKS, MD;  Location[facility];  Service: Gastroenterology;  Laterality: N/A;   ESOPHAGOGASTRODUODENOSCOPY (EGD) WITH PROPOFOL  N/A 06/29/2022   Procedure: ESOPHAGOGASTRODUODENOSCOPY (EGD) WITH PROPOFOL ;  Surgeon: Legrand Victory LITTIE DOUGLAS, MD;  Location: [facility];  Service: Gastroenterology;  Laterality: N/A;   PORT-A-CATH REMOVAL Left 07/09/2021   Procedure: REMOVAL PORT-A-CATH;  Surgeon: [name], MD;  Location: [facility];  Service: General;  Laterality: Left;   PORTACATH PLACEMENT Left 01/26/2021   Procedure: INSERTION PORT-A-CATH;  Surgeon: [name], MD;  Location: [facility];  Service: General;  Laterality: Left;   RADIOACTIVE SEED GUIDED AXILLARY SENTINEL LYMPH NODE Right 07/09/2021   Procedure: RADIOACTIVE SEED GUIDED RIGHT AXILLARY SENTINEL LYMPH NODE DISSECTION;  Surgeon: [name], MD;  Location: [facility];  Service: General;  Laterality: Right;   TUBAL LIGATION      Social History:  Ambulatory   walker       reports that she has never smoked. She has never used smokeless tobacco. She reports that she does not currently use alcohol after a past usage of about 7.0 standard drinks of alcohol per week. She reports that she does not use drugs.     Family History:   Family History   Problem Relation Age of Onset   Breast cancer Mother  20s   Hypertension Father    Uterine cancer Maternal Aunt    Ovarian cancer Maternal Aunt    Breast cancer Maternal Aunt        54s   Breast cancer Cousin 80   Uterine cancer Cousin 82   Colon polyps Neg Hx    Colon cancer Neg Hx    Esophageal cancer Neg Hx    Stomach cancer Neg Hx    ______________________________________________________________________________________________ Allergies: No Known Allergies   Prior to Admission medications   Medication Sig Start Date End Date Taking? Authorizing Provider  albuterol  (VENTOLIN  HFA) 108 (90 Base) MCG/ACT inhaler Inhale 2 puffs by mouth into the lungs every 6 hours as needed for wheezing or shortness of breath. 03/18/23  Yes [name], NP  anastrozole  (ARIMIDEX ) 1 MG tablet Take 1 tablet (1 mg total) by mouth daily. 04/18/23  Yes Causey, Morna Pickle, NP  calcitRIOL  (ROCALTROL ) 0.5 MCG capsule Take 1 capsule (0.5 mcg total) by mouth daily. 12/15/23  Yes [name], [name], MD  Calcium  Carb-Cholecalciferol  600-10 MG-MCG TABS Take 1 tablet by mouth 3 (three) times daily. 12/14/23 01/13/24 Yes [name], [name], MD  fluconazole  (DIFLUCAN ) 100 MG tablet Take 2 tablets (200 mg total) by mouth daily for 12 days. 12/14/23 12/26/23 Yes [name], [name], MD  gabapentin  (NEURONTIN ) 300 MG capsule Take 1 capsule (300 mg total) by mouth 3 (three) times daily. 05/26/23  Yes [name], [name], MD  montelukast  (SINGULAIR ) 10 MG tablet Take 1 tablet (10 mg total) by mouth at bedtime. 05/01/23  Yes [name], NP  oxyCODONE -acetaminophen  (PERCOCET) 7.5-325 MG tablet Take 1-2 tablets by mouth every 6 (six) hours as needed for severe pain (pain score 7-10). 12/21/23  Yes [name], [name], NP  pantoprazole  (PROTONIX ) 40 MG tablet Take 1 tablet (40 mg total) by mouth 2 (two) times daily. 12/14/23  Yes [name], [name], MD  polyethylene glycol powder (GLYCOLAX /MIRALAX ) 17  GM/SCOOP powder Mix 17 g in 4 oz of liquid and take by mouth 2 (two) times daily as needed. Patient taking differently: Take 17 g by mouth 2 (two) times daily as needed (for constipation). 11/30/23  Yes [name], NP  prochlorperazine  (COMPAZINE ) 10 MG tablet Take 1 tablet (10 mg total) by mouth every 6 (six) hours as needed for nausea or vomiting. 12/07/23  Yes [name], Ripudeep K, MD  scopolamine  (TRANSDERM-SCOP) 1 MG/3DAYS Place 1 patch (1.5 mg total) onto the skin every 3 (three) days as needed (Dizziness, nausea or vomiting). 12/07/23  Yes [name], Ripudeep K, MD  sennosides-docusate sodium  (SENOKOT-S) 8.6-50 MG tablet Take 1 tablet by mouth daily. 11/30/23  Yes [name], NP  abemaciclib  (VERZENIO ) 50 MG tablet Take 50 mg by mouth 2 (two) times daily. Patient not taking: Reported on 12/22/2023    [provider]  propranolol  (INDERAL ) 10 MG tablet Take 1 tablet (10 mg total) by mouth 2 (two) times daily. Please call [phone] to schedule an appointment for future refills. Thank you. 3rd attempt. Patient not taking: Reported on 12/13/2023 06/01/23   [name], [name], DO  senna (SENOKOT) 8.6 MG TABS tablet Take 2 tablets (17.2 mg total) by mouth at bedtime. Patient not taking: Reported on 12/22/2023 06/30/22   Davia [name], MD    ___________________________________________________________________________________________________ Physical Exam:    12/22/2023    6:30 PM 12/22/2023    6:00 PM 12/22/2023    3:22 PM  Vitals with BMI  Height   5' 11  Weight  169 lbs  BMI   23.58  Systolic 92 111   Diastolic 79 73   Pulse 77 79      1. General:  in No  Acute distress   Chronically ill  -appearing 2. Psychological: Alert and   Oriented 3. Head/ENT:   Dry Mucous Membranes                          Head Non traumatic, neck supple                           Poor Dentition 4. SKIN:  decreased Skin turgor,  Skin clean Dry and intact no rash    5. Heart: Regular rate and rhythm no   Murmur, no Rub or gallop 6. Lungs: , no wheezes or crackles   7. Abdomen: Soft, luq slightly tender-tender, Non distended  bowel sounds present 8. Lower extremities: no clubbing, cyanosis, no  edema 9. Neurologically pt endorses that her left leg seem heavier, states this has been ongoing for the past few weeks 10. MSK: Normal range of motion    Chart has been reviewed  ______________________________________________________________________________________________  Assessment/Plan  53 y.o. 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) Likely in the setting of dehydration obtain urine electrolytes CT scan showing no evidence of obstruction Continue to follow renal function  -chronic avoid nephrotoxic medications such as NSAIDs, Vanco Zosyn combo,  avoid hypotension, continue to follow renal function    Hypocalcemia Replace and recheck  Hypokalemia - will replace electrolytes and repeat  check Mg, phos and Ca level and replace as needed Monitor on telemetry   Lab Results  Component Value Date   K 3.1 (L) 12/22/2023     Lab Results  Component Value Date   CREATININE 3.20 (H) 12/22/2023   Lab Results  Component Value Date   MG 2.8 (H) 12/22/2023   Lab Results  Component Value Date   CALCIUM  7.0 (L) 12/22/2023   PHOS 3.5 12/22/2023     Breast cancer metastasized to bone, right (HCC)  chronic  followed by Oncology Hold off on home meds for now  Acute esophagitis History of candidiasis and Candida esophagitis.  Continue fluconazole  but changed to IV  Candida esophagitis (HCC)    Continue fluconazole  but changed to IV  Constipation Order bowel regimen  Dehydration Rehydrate and follow renal function  HLD (hyperlipidemia) Not on statin  Transaminitis In the setting of dehydration check CK level CT abdomen: No evidence of obstructive picture   Pancreatic mass CT scan worrisome for abnormal pancreatic finding.  At some point may benefit from further evaluation using MRI at this point would hold off on contrasted MRI studies given AKI  Prolonged QT interval - will monitor on tele avoid QT prolonging medications, rehydrate correct electrolytes   Right sided weakness Patient endorses intermittent right leg and arm weakness today  on physical exam it is difficult to ascertain.  Patient states has been going on for weeks and coming and going.  She have had MRI of her brain without contrast on 16 June and PET scan done on 5 June May benefit from further evaluation with contrasted study once renal function improves. The symptoms are not acute at this time patient does not want to undergo MRI testing  Hypotension In the setting of dehydration will rehydrate hold propranolol    Other plan as per orders.  DVT prophylaxis:  SCD      Code Status:    Code Status: Prior FULL CODE as per patient   I had personally discussed CODE STATUS with patient   ACP   has been reviewed     Family Communication:   Family  at  Bedside  plan of care was discussed   with  mother  Diet clears   Disposition Plan:       To home once workup is complete and patient is stable   Following barriers for discharge:                                                        Electrolytes corrected                                                          Will need to be able to tolerate PO                                                      Will need consultants to evaluate patient prior to discharge                      Consult Orders  (From admission, onward)           Start     Ordered   12/22/23 1800  Consult to hospitalist  Once       Provider:  (Not yet assigned)  Question Answer Comment  Place call to: [facility]   Reason for Consult Admit      12/22/23 1759                               Would benefit from PT/OT eval prior to DC  Ordered                                       Consults called: sent msg to [facility]   Admission status:  ED Disposition     ED Disposition  Admit   Condition  --   Comment  Hospital Area: [facility] [[id number]]  Level of Care: Progressive [102]  Admit to Progressive based on following criteria: NEPHROLOGY stable condition requiring close monitoring for AKI, requiring Hemodialysis or Peritoneal Dialysis either from expected electrolyte imbalance,  acidosis, or fluid overload that can be managed by NIPPV or high flow oxygen.  May place patient in observation at [facility] or [facility] if equivalent level of care is available:: No  Covid Evaluation: Asymptomatic - no recent exposure (last 10 days) testing not required  Diagnosis: AKI (acute kidney injury) [facility]) [[id number]]  Admitting Physician: Kaylea Mounsey [[id number]]  Attending Physician: Fredrik Mogel [[id number]]           Obs    Level of care        progressive      Yunior Jain 12/22/2023, 8:02 PM    [facility]     after 2 AM please page floor coverage   If 7AM-7PM, please contact the day team taking care of the patient using [email or web address]

## 2023-12-22 NOTE — Progress Notes (Signed)
 [facility] Cancer Follow up:    Chelsea Rosaline SQUIBB, NP [address] [place] [address]   DIAGNOSIS:  Cancer Staging  Breast cancer [facility]) Staging form: Breast, AJCC 8th Edition - Clinical stage from 12/17/2020: Stage IA (cT1b, cN0, cM0, G2, ER+, PR+, HER2-) - Signed by [name], NP on 01/14/2022 Stage prefix: Initial diagnosis Histologic grading system: 3 grade system Laterality: Left Staged by: Pathologist and managing physician Stage used in treatment planning: Yes National guidelines used in treatment planning: Yes Type of national guideline used in treatment planning: NCCN - Pathologic stage from 07/09/2021: ypTis (DCIS), ypN0, cM0 - Signed by [name], NP on 07/22/2021 Stage prefix: Post-therapy  Malignant neoplasm of upper-outer quadrant of right breast in female, estrogen receptor positive (HCC) Staging form: Breast, AJCC 8th Edition - Clinical stage from 12/17/2020: Stage IIA (cT2, cN1, cM0, G2, ER+, PR+, HER2-) - Signed by [name], NP on 01/14/2022 Stage prefix: Initial diagnosis Histologic grading system: 3 grade system Laterality: Right Staged by: Pathologist and managing physician Stage used in treatment planning: Yes National guidelines used in treatment planning: Yes Type of national guideline used in treatment planning: NCCN - Pathologic stage from 07/09/2021: ypT2, ypN1a, cM0, G2 - Signed by [name], NP on 07/22/2021 Stage prefix: Post-therapy Histologic grading system: 3 grade system - Pathologic: Stage IV (cM1) - Signed by [name], NP on 12/07/2022    SUMMARY OF ONCOLOGIC HISTORY: Oncology History  Malignant neoplasm of upper-outer quadrant of right breast in female, estrogen receptor positive (HCC)  12/12/2020 Initial Diagnosis   status post bilateral breast biopsies 12/03/2020, showing             (1) on the right, a clinical T2 N1, stage IIa invasive lobular  carcinoma, grade 2, estrogen and progesterone receptor strongly positive, HER2 not amplified, with an MIB-1 of 40%                         (a) the biopsied right axillary lymph node was positive with extracapsular extension                         (b) a second right breast mass also biopsied was a fibroadenoma, concordant  MAMMAPRINT tested on biopsy returned high risk, luminal type B, indicating significant benefit from chemo                         (c) biopsy of an area of non-mass-like enhancement in the upper right breast pending   12/17/2020 Cancer Staging   Staging form: Breast, AJCC 8th Edition - Clinical stage from 12/17/2020: Stage IIA (cT2, cN1, cM0, G2, ER+, PR+, HER2-) - Signed by [name], NP on 01/14/2022 Stage prefix: Initial diagnosis Histologic grading system: 3 grade system Laterality: Right Staged by: Pathologist and managing physician Stage used in treatment planning: Yes National guidelines used in treatment planning: Yes Type of national guideline used in treatment planning: NCCN   12/24/2020 Genetic Testing   Negative genetic testing:  No pathogenic variants detected on the [employer] BRCAplus panel (report date 12/24/2020) or the CancerNext-Expanded + RNAinsight panel (report date 12/31/2020). 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   The BRCAplus panel offered by [employer] and includes sequencing and deletion/duplication analysis for the following 8 genes: ATM, BRCA1, BRCA2, CDH1, CHEK2, PALB2, PTEN,  and TP53. The CancerNext-Expanded + RNAinsight gene panel offered by [employer] and includes sequencing and rearrangement analysis for the following 77 genes: AIP, ALK, APC, ATM, AXIN2, BAP1, BARD1, BLM, BMPR1A, BRCA1, BRCA2, BRIP1, CDC73, CDH1, CDK4, CDKN1B, CDKN2A, CHEK2, CTNNA1, DICER1, FANCC, FH, FLCN, GALNT12, KIF1B, LZTR1, MAX, MEN1, MET, MLH1, MSH2, MSH3, MSH6, MUTYH, NBN, NF1, NF2, NTHL1, PALB2,  PHOX2B, PMS2, POT1, PRKAR1A, PTCH1, PTEN, RAD51C, RAD51D, RB1, RECQL, RET, SDHA, SDHAF2, SDHB, SDHC, SDHD, SMAD4, SMARCA4, SMARCB1, SMARCE1, STK11, SUFU, TMEM127, TP53, TSC1, TSC2, VHL and XRCC2 (sequencing and deletion/duplication); EGFR, EGLN1, HOXB13, KIT, MITF, PDGFRA, POLD1 and POLE (sequencing only); EPCAM and GREM1 (deletion/duplication only). RNA data is routinely analyzed for use in variant interpretation for all genes.   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 Multifocal sclerotic bony metastatic disease greatest in the thoracic spine with scattered rib lesions and sclerosis as well. 2. No nodal or visceral metastasis about the chest. 3. Collateral pathways about the chest suggest some subclavian venous narrowing on the LEFT. This could also be related to arm position. Correlate clinically. 4. Signs of RIGHT breast lumpectomy. Potential postoperative changes in the LEFT breast with small focal area of nodularity in the lateral LEFT breast  which could also be postoperative, correlate clinically and consider mammographic correlation as warranted. 5. Mild cardiac enlargement.   05/18/2022 Treatment Plan Change   Anastrozole  + Verzenio  100mg  PO BID   12/07/2022 Cancer Staging   Staging form: Breast, AJCC 8th Edition - Pathologic: Stage IV (cM1) - Signed by [name], NP on 12/07/2022   Breast cancer (HCC)  12/12/2020 Initial Diagnosis   on the left, a clinical T1b N0, stage IA invasive ductal carcinoma, grade 1 or 2, estrogen and progesterone receptor positive, HER2 not amplified, with an MIB 1 of 15%   12/17/2020 Cancer Staging   Staging form: Breast, AJCC 8th Edition - Clinical stage from 12/17/2020: Stage IA (cT1b, cN0, cM0, G2, ER+, PR+, HER2-) - Signed by [name], NP on 01/14/2022 Stage prefix: Initial diagnosis Histologic grading system: 3 grade system Laterality: Left Staged by: Pathologist and managing physician Stage used in treatment planning: Yes National guidelines used in treatment planning: Yes Type of national guideline used in treatment planning: NCCN   12/24/2020 Genetic Testing   Negative genetic testing:  No pathogenic variants detected on the [employer] BRCAplus panel (report date 12/24/2020) or the CancerNext-Expanded + RNAinsight panel (report date 12/31/2020). 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   The BRCAplus panel offered by [employer] and  includes sequencing and deletion/duplication analysis for the following 8 genes: ATM, BRCA1, BRCA2, CDH1, CHEK2, PALB2, PTEN, and TP53. The CancerNext-Expanded + RNAinsight gene panel offered by [employer] and includes sequencing and rearrangement analysis for the following 77 genes: AIP, ALK, APC, ATM, AXIN2, BAP1, BARD1, BLM, BMPR1A, BRCA1, BRCA2, BRIP1, CDC73, CDH1, CDK4, CDKN1B, CDKN2A, CHEK2, CTNNA1, DICER1, FANCC, FH, FLCN, GALNT12, KIF1B, LZTR1, MAX, MEN1, MET, MLH1, MSH2,  MSH3, MSH6, MUTYH, NBN, NF1, NF2, NTHL1, PALB2, PHOX2B, PMS2, POT1, PRKAR1A, PTCH1, PTEN, RAD51C, RAD51D, RB1, RECQL, RET, SDHA, SDHAF2, SDHB, SDHC, SDHD, SMAD4, SMARCA4, SMARCB1, SMARCE1, STK11, SUFU, TMEM127, TP53, TSC1, TSC2, VHL and XRCC2 (sequencing and deletion/duplication); EGFR, EGLN1, HOXB13, KIT, MITF, PDGFRA, POLD1 and POLE (sequencing only); EPCAM and GREM1 (deletion/duplication only). RNA data is routinely analyzed for use in variant interpretation for all genes.   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 Multifocal sclerotic bony metastatic disease greatest in the thoracic spine with scattered rib lesions and sclerosis as well. 2. No nodal or visceral metastasis about the chest. 3. Collateral pathways about the chest suggest some subclavian venous narrowing on the LEFT. This could also be related to arm position. Correlate clinically. 4. Signs of RIGHT breast lumpectomy. Potential postoperative changes in the LEFT breast with small focal area of nodularity in the lateral LEFT  breast which could also be postoperative, correlate clinically and consider mammographic correlation as warranted. 5. Mild cardiac enlargement.   05/18/2022 Treatment Plan Change   Anastrozole  + Verzenio  100mg  PO BID   06/23/2022 - 07/07/2022 Radiation Therapy   06/23/2022 through 07/07/2022 Site Technique Total Dose (Gy) Dose per Fx (Gy) Completed Fx Beam Energies  Lumbar Spine: Spine_LS_Pelv 3D 30/30 3 10/10 15X       CURRENT THERAPY: Anastrozole   INTERVAL HISTORY:  Discussed the use of AI scribe software for clinical note transcription with the patient, who gave verbal consent to proceed.  History of Present Illness Chelsea Hansen is a 53 year old female who presents with persistent nausea, vomiting, and significant weight loss. She is accompanied by her wife.  She experiences persistent nausea and vomiting for the past month, with symptoms fluctuating in intensity. Vomiting occurs throughout the day and night, with three episodes yesterday. She is unable to retain medications or maintain hydration.  She has lost twelve pounds recently, with her weight recorded at 169 pounds on Monday. Laboratory results show elevated liver enzymes (AST and ALT) and increased kidney function test values. Her blood pressure is low, 65/29, and she has low potassium levels, requiring multiple potassium infusions.  She has a history of gastrointestinal issues and underwent  an upper endoscopy last week with a dilation of her stenosis, PPI bid, and fluconazole  daily. Despite interventions, including IV fluids and potassium supplementation, her symptoms persist.     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   Abnormal CT scan, colon 12/14/2023   Acute esophagitis 12/14/2023   Candida esophagitis (HCC) 12/14/2023   Therapeutic opioid-induced constipation (OIC) 12/11/2023   Hypocalcemia 12/10/2023   Fever of unknown origin 12/10/2023   Sinus tachycardia 12/10/2023    Acute kidney injury superimposed on chronic kidney disease (HCC) 12/05/2023   Electrolyte abnormality 12/05/2023   Dehydration 12/05/2023   QT prolongation 12/05/2023   Intractable nausea and vomiting 11/24/2023   Neoplasm related pain 12/07/2022   Constipation 06/30/2022   Nausea and vomiting in adult 06/29/2022   Hematemesis 06/28/2022   Breast cancer metastasized to bone, right (HCC) 05/20/2022   Supraventricular tachycardia (HCC) 07/08/2021   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   GERD (gastroesophageal reflux disease) 05/27/2021   HLD (hyperlipidemia) 05/27/2021   Essential hypertension 05/27/2021   Pain management contract signed 05/04/2021   Chronic pain syndrome (breast cancer) 05/04/2021   Palpitations 03/25/2021   Sinus bradycardia 03/25/2021   Daytime somnolence 03/25/2021   Snoring 03/25/2021   Obesity (BMI 30-39.9) 03/25/2021   Port-A-Cath in place 01/27/2021   Genetic testing 12/24/2020   Family history of uterine cancer 12/17/2020   Malignant neoplasm of upper-outer quadrant of right breast in female, estrogen receptor positive (HCC) 12/12/2020   Breast cancer (HCC) 12/12/2020   Hypokalemia 01/02/2012   Chronic asthma 01/01/2012   Bradycardia 01/01/2012   Syncope 12/31/2011   Mediastinal adenopathy 12/31/2011   Anemia 12/31/2011   Chest pain 12/31/2011    has no known allergies.  MEDICAL HISTORY: Past Medical History:  Diagnosis Date   Allergy    seasonal allergies   Anxiety    on meds   Asthma    uses inhaler   Breast cancer (HCC) 2012   RIGHT lumpectomy   Cancer (HCC) 2022   RIGHT breast lump-dx 2022   Depression    on meds   DVT (deep venous thrombosis) (HCC) 2010   after hysterectomy   Family history of uterine cancer    GERD (gastroesophageal reflux disease)    with certain foods/OTC PRN meds   Headache(784.0)    History of radiation therapy    Bilateral breast- 09/03/21-11/02/21- Dr. Lynwood Nasuti   Hyperlipidemia    on  meds   Hypertension    on meds   SVT (supraventricular tachycardia) (HCC)     SURGICAL HISTORY: Past Surgical History:  Procedure Laterality Date   ABDOMINAL HYSTERECTOMY     AXILLARY SENTINEL NODE BIOPSY Left 07/09/2021   Procedure: LEFT AXILLARY SENTINEL NODE BIOPSY;  Surgeon: [name], MD;  Location: [facility];  Service: General;  Laterality: Left;   BALLOON DILATION N/A 12/13/2023   Procedure: MERRILL HODGKIN;  Surgeon: Federico Rosario BROCKS, MD;  Location: [facility] ENDOSCOPY;  Service: Gastroenterology;  Laterality: N/A;   BIOPSY  06/29/2022   Procedure: BIOPSY;  Surgeon: Legrand Chelsea LITTIE DOUGLAS, MD;  Location: [facility] ENDOSCOPY;  Service: Gastroenterology;;   BIOPSY OF SKIN SUBCUTANEOUS TISSUE AND/OR MUCOUS MEMBRANE  12/13/2023   Procedure: BIOPSY, SKIN, SUBCUTANEOUS TISSUE, OR MUCOUS MEMBRANE;  Surgeon: Federico Rosario BROCKS, MD;  Location: [facility] ENDOSCOPY;  Service: Gastroenterology;;   BREAST EXCISIONAL BIOPSY Right 09/16/2009   BREAST LUMPECTOMY WITH RADIOACTIVE SEED LOCALIZATION Bilateral 07/09/2021   Procedure: BILATERAL BREAST LUMPECTOMY  WITH RADIOACTIVE SEED LOCALIZATION;  Surgeon: [name], MD;  Location: [facility];  Service: General;  Laterality: Bilateral;   BREAST SURGERY     lumpectomy   ESOPHAGOGASTRODUODENOSCOPY N/A 12/13/2023   Procedure: EGD (ESOPHAGOGASTRODUODENOSCOPY);  Surgeon: Federico Rosario BROCKS, MD;  Location: [facility] END[facility]COPY;  Service: Gastroenterology;  Laterality: N/A;   ESOPHAGOGASTRODUODENOSCOPY (EGD) WITH PROPOFOL  N/A 06/29/2022   Procedure: ESOPHAGOGASTRODUODENOSCOPY (EGD) WITH PROPOFOL ;  Surgeon: Legrand Chelsea LITTIE DOUGLAS, MD;  Location: [facility];  Service: Gastroenterology;  Laterality: N/A;   PORT-A-CATH REMOVAL Left 07/09/2021   Procedure: REMOVAL PORT-A-CATH;  Surgeon: [name], MD;  Location: [facility];  Service: General;  Laterality: Left;   PORTACATH PLACEMENT Left 01/26/2021   Procedure: INSERTION PORT-A-CATH;  Surgeon:  [name], MD;  Location: [facility];  Service: General;  Laterality: Left;   RADIOACTIVE SEED GUIDED AXILLARY SENTINEL LYMPH NODE Right 07/09/2021   Procedure: RADIOACTIVE SEED GUIDED RIGHT AXILLARY SENTINEL LYMPH NODE DISSECTION;  Surgeon: [name], MD;  Location: [facility];  Service: General;  Laterality: Right;   TUBAL LIGATION      SOCIAL HISTORY: Social History   Socioeconomic History   Marital status: Single    Spouse name: Not on file   Number of children: Not on file   Years of education: Not on file   Highest education level: Not on file  Occupational History   Not on file  Tobacco Use   Smoking status: Never   Smokeless tobacco: Never  Vaping Use   Vaping status: Never Used  Substance and Sexual Activity   Alcohol use: Not Currently    Alcohol/week: 7.0 standard drinks of alcohol    Types: 7 Standard drinks or equivalent per week    Comment: occassional   Drug use: No   Sexual activity: Not Currently  Other Topics Concern   Not on file  Social History Narrative   Not on file   Social Drivers of Health   Financial Resource Strain: High Risk (01/14/2022)   Overall Financial Resource Strain (CARDIA)    Difficulty of Paying Living Expenses: Very hard  Food Insecurity: No Food Insecurity (12/16/2023)   Hunger Vital Sign    Worried About Running Out of Food in the Last Year: Never true    Ran Out of Food in the Last Year: Never true  Recent Concern: Food Insecurity - Food Insecurity Present (11/24/2023)   Hunger Vital Sign    Worried About Running Out of Food in the Last Year: Sometimes true    Ran Out of Food in the Last Year: Sometimes true  Transportation Needs: No Transportation Needs (12/16/2023)   PRAPARE - Administrator, Civil Service (Medical): No    Lack of Transportation (Non-Medical): No  Physical Activity: Insufficiently Active (08/01/2023)   Exercise Vital Sign    Days of Exercise per Week: 1 day    Minutes of  Exercise per Session: 10 min  Stress: Stress Concern Present (10/10/2023)   [employer] of Occupational Health - Occupational Stress Questionnaire    Feeling of Stress : Rather much  Social Connections: Not on file  Intimate Partner Violence: Not At Risk (12/16/2023)   Humiliation, Afraid, Rape, and Kick questionnaire    Fear of Current or Ex-Partner: No    Emotionally Abused: No    Physically Abused: No    Sexually Abused: No    FAMILY HISTORY: Family History  Problem Relation Age of Onset   Breast cancer Mother  20s   Hypertension Father    Uterine cancer Maternal Aunt    Ovarian cancer Maternal Aunt    Breast cancer Maternal Aunt        20s   Breast cancer Cousin 72   Uterine cancer Cousin 62   Colon polyps Neg Hx    Colon cancer Neg Hx    Esophageal cancer Neg Hx    Stomach cancer Neg Hx     Review of Systems  Constitutional:  Positive for appetite change, fatigue and unexpected weight change. Negative for chills and fever.  HENT:   Negative for hearing loss, lump/mass and trouble swallowing.   Eyes:  Negative for eye problems and icterus.  Respiratory:  Negative for chest tightness, cough and shortness of breath.   Cardiovascular:  Negative for chest pain, leg swelling and palpitations.  Gastrointestinal:  Positive for nausea and vomiting. Negative for abdominal distention, abdominal pain, blood in stool, constipation, diarrhea and rectal pain.  Endocrine: Negative for hot flashes.  Genitourinary:  Negative for difficulty urinating.   Musculoskeletal:  Negative for arthralgias.  Skin:  Negative for itching and rash.  Neurological:  Positive for light-headedness. Negative for dizziness, extremity weakness, headaches and numbness.  Hematological:  Negative for adenopathy. Does not bruise/bleed easily.  Psychiatric/Behavioral:  Negative for depression. The patient is not nervous/anxious.       PHYSICAL EXAMINATION   Onc Performance Status - 12/22/23 1039        ECOG Perf Status   ECOG Perf Status Capable of only limited selfcare, confined to bed or chair more than 50% of waking hours      KPS SCALE   KPS % SCORE Normal activity with effort, some s/s of disease          Vitals:   12/22/23 1025 12/22/23 1039  BP: (!) 74/45 (!) 65/29  Pulse: 75 (!) 124  Resp: 20   Temp: 97.7 F (36.5 C)   SpO2: 100% 97%    Physical Exam Constitutional:      Appearance: She is ill-appearing. She is not toxic-appearing.  HENT:     Head: Normocephalic and atraumatic.     Mouth/Throat:     Mouth: Mucous membranes are dry.     Pharynx: No oropharyngeal exudate or posterior oropharyngeal erythema.  Eyes:     General: No scleral icterus. Cardiovascular:     Rate and Rhythm: Regular rhythm. Tachycardia present.     Pulses: Normal pulses.     Heart sounds: Normal heart sounds.  Abdominal:     General: Abdomen is flat. There is no distension.     Palpations: Abdomen is soft.  Musculoskeletal:     Cervical back: Neck supple.  Lymphadenopathy:     Cervical: No cervical adenopathy.  Skin:    General: Skin is warm and dry.     Findings: No rash.  Neurological:     General: No focal deficit present.     Mental Status: She is alert.  Psychiatric:        Mood and Affect: Mood normal.        Behavior: Behavior normal.     LABORATORY DATA:  CBC    Component Value Date/Time   WBC 6.2 12/22/2023 0957   WBC 5.7 12/14/2023 0353   RBC 5.52 (H) 12/22/2023 0957   HGB 12.7 12/22/2023 0957   HGB 11.5 10/06/2020 1432   HCT 38.8 12/22/2023 0957   HCT 36.8 10/06/2020 1432   PLT 352 12/22/2023 0957   PLT  323 10/06/2020 1432   MCV 70.3 (L) 12/22/2023 0957   MCV 73 (L) 10/06/2020 1432   MCH 23.0 (L) 12/22/2023 0957   MCHC 32.7 12/22/2023 0957   RDW 14.0 12/22/2023 0957   RDW 15.9 (H) 10/06/2020 1432   LYMPHSABS 1.3 12/22/2023 0957   LYMPHSABS 1.9 10/06/2020 1432   MONOABS 0.6 12/22/2023 0957   EOSABS 0.0 12/22/2023 0957   EOSABS 0.1  10/06/2020 1432   BASOSABS 0.1 12/22/2023 0957   BASOSABS 0.0 10/06/2020 1432    CMP     Component Value Date/Time   NA 136 12/22/2023 0957   NA 144 11/30/2023 1000   K 3.0 (L) 12/22/2023 0957   CL 89 (L) 12/22/2023 0957   CO2 27 12/22/2023 0957   GLUCOSE 134 (H) 12/22/2023 0957   BUN 46 (H) 12/22/2023 0957   BUN 19 11/30/2023 1000   CREATININE 3.25 (H) 12/22/2023 0957   CALCIUM  8.2 (L) 12/22/2023 0957   PROT 9.1 (H) 12/22/2023 0957   PROT 7.3 11/30/2023 1000   ALBUMIN 4.6 12/22/2023 0957   ALBUMIN 4.5 11/30/2023 1000   AST 146 (H) 12/22/2023 0957   ALT 334 (HH) 12/22/2023 0957   ALKPHOS 378 (H) 12/22/2023 0957   BILITOT 0.9 12/22/2023 0957   GFRNONAA 17 (L) 12/22/2023 0957   GFRAA >60 09/07/2019 1340     ASSESSMENT and THERAPY PLAN:   Malignant neoplasm of upper-outer quadrant of right breast in female, estrogen receptor positive (HCC) 12/03/2020: Bilateral breast biopsies: Right breast: T2N1 stage IIa grade 2 ILC, ER/PR positive, HER2 negative, Ki-67 40%, right axillary lymph node positive with extracapsular extension MammaPrint: High risk 01/27/2021 -06/08/2021 neoadjuvant chemotherapy with dose dense Adriamycin  and Cytoxan  x4 followed by Taxol  x11 METASTATIC DISEASE Right chest wall pain: CT chest 05/13/2022: Multifocal bone metastases thoracic spine and scattered rib lesions, no nodal or visceral metastases.  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 11/21/2022: CT CAP: Bone mets unchanged, 5 mm right lobe of the liver: Stable sigmoid colon: Mild  diverticulitis 11/21/2022: Bone scan: Mild activity at L3 vertebral body compression fracture, stable 12/15/2022: MRI lumbar spine: Stable pathologic fracture L3 stable other bone mets 06/10/2023: PET/CT: Interval development of hypermetabolic lymphadenopathy left submandibular region, throughout mediastinal, bilateral hilar, upper abdomen (no uptake in the bones) 07/06/2023: Submandibular lymph node biopsy: Benign  ----------------------------------------------------------------------------------------------------------------------------- Current treatment: VerzEnio  (started 05/21/2022) with anastrozole --Verzenio  on hold beginning 12/05/2023 (discontinued from med list)  Assessment and Plan Assessment & Plan Dehydration Severe dehydration due to nausea and vomiting causing hypotension and electrolyte imbalances. I cautioned her that her critically low blood pressure poses fatal risk. - Administer IV fluids immediately. - Transfer to ER for continued IV fluid administration and monitoring. - She vocalized refusal to go to ER due to long stays and still having issues leading to her return.    Acute kidney injury Acute kidney injury secondary to severe dehydration with creatinine rising to 3.2. Cautioned patient this is secondary to her dehydration from nausea/vomiting.  I recommended immediate intervention needed to prevent permanent damage. - Transfer to ER for continued IV fluid administration and monitoring and possible admission. -  Monitor kidney function closely in the hospital.  Nausea and vomiting Persistent nausea and vomiting contributing to dehydration and weight loss. Requires gastroenterologist evaluation. - Transfer to ER for evaluation and management. - Consult with gastroenterology for further assessment and treatment.  Esophageal stenosis Known esophageal stenosis may require further dilation or endoscopy due to worsening symptoms and weight loss. - Transfer to ER for potential  endoscopy and further management. - Consult with gastroenterology for evaluation of esophageal stenosis  Weight loss Significant weight loss likely due to nausea, vomiting, and esophageal stricture. Urgent evaluation needed to prevent further loss. - Transfer to ER for comprehensive evaluation and management.  Elevated liver enzymes Significantly elevated liver enzymes require further investigation. Possible liver insult or injury, etiology unclear. - Transfer to ER for imaging and further evaluation of liver function.  I reached out to Dr. [name] in GI who agrees with patient going to ER, and also reviewed the above with Dr. [name].  Patient most recent imaging on PET scan didn't show any obvious cancer related cause of this acute issue.    I met with patient's children after the initial encounter, and reviewed each hospitalization, the concerns, and my recommendations for more IV fluids and imaging to understand the etiology of her persistent nausea and vomiting.  They are understandably concerned at the multiple hospital encounters with no resolution of her symptoms and the time between hospitalization and follow-up.  They are considering a second opinion and [address] or [place].    I met with Barb after she received IV fluids.  Her BP is improved.  She asked if she could please go home, however I reviewed that I am still concerned that she is unable to keep anything down, that she has had persistent weight loss, and her kidney and liver function elevation.  Her mom came up to the cancer center, and the patient was agreeable to go to the ER.       All questions were answered. The patient knows to call the clinic with any problems, questions or concerns. We can certainly see the patient much sooner if necessary.  Total encounter time:45 minutes*in face-to-face visit time, chart review, lab review, care coordination, order entry, and documentation of the encounter time.    Morna Kendall,  NP 12/22/23 3:13 PM Medical Oncology and Hematology [facility] [address] [place], [place] [address] Tel. [phone]    Fax. [phone]  *Total Encounter Time as defined by the Centers for Medicare and Medicaid Services includes, in addition to the face-to-face time of a patient visit (documented in the note above) non-face-to-face time: obtaining and reviewing outside history, ordering and reviewing medications, tests or procedures, care coordination (communications with other health care professionals or caregivers) and documentation in the medical record.

## 2023-12-22 NOTE — Assessment & Plan Note (Signed)
 Replace and recheck.

## 2023-12-22 NOTE — Progress Notes (Signed)
 CRITICAL VALUE STICKER  CRITICAL VALUE: [id number] [phone]  RECEIVER (on-site recipient of call): Larraine, RN  DATE & TIME NOTIFIED: 12/22/23 at 1050  MD NOTIFIED: Morna Kendall, NP  TIME OF NOTIFICATION: 12/22/23 at 1050  RESPONSE:  NP notified and verbalized understanding.

## 2023-12-22 NOTE — Assessment & Plan Note (Addendum)
 Patient endorses intermittent right leg and arm weakness today on physical exam it is difficult to ascertain.  Patient states has been going on for weeks and coming and going.  She have had MRI of her brain without contrast on 16 June and PET scan done on 5 June May benefit from further evaluation with contrasted study once renal function improves. The symptoms are not acute at this time patient does not want to undergo MRI testing

## 2023-12-22 NOTE — Assessment & Plan Note (Addendum)
 chronic followed by Oncology Hold off on home meds for now

## 2023-12-22 NOTE — Telephone Encounter (Signed)
 Pt came in for OV and is now receiving IVF in outpatient CC. Pt has been strongly advised and encouraged that going to the ER and being evaluated would be the best plan due to current labs and vital signs. Pt verbalized that she will not go to the ER and does not want to be admitted into the hospital. Advised pt that further evaluation is encouraged to be able to assist her further, pt declined. Providers are aware of pt health status and decision upon not going to ER.

## 2023-12-22 NOTE — Subjective & Objective (Signed)
 Has been having nausea and vomiting for about 1 month, followed by [facility] Has been very weak not tolerating PO not keeping her meds down  Her BP today has been low at cancer center 65/39 feeling presyncopal Has been losing weight

## 2023-12-22 NOTE — Assessment & Plan Note (Signed)
Rehydrate and follow renal function

## 2023-12-22 NOTE — Telephone Encounter (Signed)
 Patient son called and stated that his mother was needing to be seen today at later time with amanda or a different provider. Patient son stated that his mother is very weak and if he were to take her to the hospital it would be over at [facility] and they currently live in [place]. Patient son stated that his mother is a cancer patient and within the course of 1 week she has lost extreme amount of weight not being able to keep any food or liquids down. Patient son would like to see if it's possible to get his mother seen with any before the 87 th of June due to it being to far for his mother. Patient son is requesting a call back at [phone]. Please advise.

## 2023-12-22 NOTE — Assessment & Plan Note (Signed)
Order bowel regimen °

## 2023-12-22 NOTE — Assessment & Plan Note (Signed)
-   will replace electrolytes and repeat  check Mg, phos and Ca level and replace as needed Monitor on telemetry   Lab Results  Component Value Date   K 3.1 (L) 12/22/2023     Lab Results  Component Value Date   CREATININE 3.20 (H) 12/22/2023   Lab Results  Component Value Date   MG 2.8 (H) 12/22/2023   Lab Results  Component Value Date   CALCIUM  7.0 (L) 12/22/2023   PHOS 3.5 12/22/2023

## 2023-12-22 NOTE — ED Provider Notes (Signed)
 [place] EMERGENCY DEPARTMENT AT [facility] Provider Note   CSN: [id number] Arrival date & time: 12/22/23  1504     Patient presents with: Abdominal Pain and Emesis   Chelsea Hansen is a 53 y.o. female.   53 year old female with prior medical history below presents from oncology center for evaluation and admission.  Prior medical history inclueds of CKD stage IIIa, history of recurrent hospital admission for intractable nausea vomiting with associated electrolyte derangement, supraventricular tachycardia, asthma, essential hypertension, hyperlipidemia, chronic pulmonary nodule, cancer related pain and metastatic breast cancer status post neoadjuvant chemotherapy, lumpectomy, SNL biopsy status post radiation currently on anastrozole  and abemaciclib , recent issues with recurrent nausea vomiting requiring admissions.    See excerpt from Oncology note from earlier today below:  Assessment & Plan Dehydration Severe dehydration due to nausea and vomiting causing hypotension and electrolyte imbalances. I cautioned her that her critically low blood pressure poses fatal risk. - Administer IV fluids immediately. - Transfer to ER for continued IV fluid administration and monitoring. - She vocalized refusal to go to ER due to long stays and still having issues leading to her return.     Acute kidney injury Acute kidney injury secondary to severe dehydration with creatinine rising to 3.2. Cautioned patient this is secondary to her dehydration from nausea/vomiting.  I recommended immediate intervention needed to prevent permanent damage. - Transfer to ER for continued IV fluid administration and monitoring and possible admission. - Monitor kidney function closely in the hospital.   Nausea and vomiting Persistent nausea and vomiting contributing to dehydration and weight loss. Requires gastroenterologist evaluation. - Transfer to ER for evaluation and management. - Consult with  gastroenterology for further assessment and treatment.   Esophageal stenosis Known esophageal stenosis may require further dilation or endoscopy due to worsening symptoms and weight loss. - Transfer to ER for potential endoscopy and further management. - Consult with gastroenterology for evaluation of esophageal stenosis   Weight loss Significant weight loss likely due to nausea, vomiting, and esophageal stricture. Urgent evaluation needed to prevent further loss. - Transfer to ER for comprehensive evaluation and management.   Elevated liver enzymes Significantly elevated liver enzymes require further investigation. Possible liver insult or injury, etiology unclear. - Transfer to ER for imaging and further evaluation of liver function.   I reached out to Dr. [name] in GI who agrees with patient going to ER, and also reviewed the above with Dr. [name].  Patient most recent imaging on PET scan didn't show any obvious cancer related cause of this acute issue.     I met with patient's children after the initial encounter, and reviewed each hospitalization, the concerns, and my recommendations for more IV fluids and imaging to understand the etiology of her persistent nausea and vomiting.  They are understandably concerned at the multiple hospital encounters with no resolution of her symptoms and the time between hospitalization and follow-up.  They are considering a second opinion and [address] or [place].     I met with Chelsea Hansen after she received IV fluids.  Her BP is improved.  She asked if she could please go home, however I reviewed that I am still concerned that she is unable to keep anything down, that she has had persistent weight loss, and her kidney and liver function elevation.  Her mom came up to the cancer center, and the patient was agreeable to go to the ER.          Abdominal Pain Associated symptoms:  vomiting   Emesis Associated symptoms: abdominal pain        Prior to  Admission medications   Medication Sig Start Date End Date Taking? Authorizing Provider  albuterol  (VENTOLIN  HFA) 108 (90 Base) MCG/ACT inhaler Inhale 2 puffs by mouth into the lungs every 6 hours as needed for wheezing or shortness of breath. 03/18/23   [name], NP  anastrozole  (ARIMIDEX ) 1 MG tablet Take 1 tablet (1 mg total) by mouth daily. Patient not taking: Reported on 12/16/2023 04/18/23   [name], NP  calcitRIOL  (ROCALTROL ) 0.5 MCG capsule Take 1 capsule (0.5 mcg total) by mouth daily. 12/15/23   [name], [name], MD  Calcium  Carb-Cholecalciferol  600-10 MG-MCG TABS Take 1 tablet by mouth 3 (three) times daily. 12/14/23 01/13/24  [name], [name], MD  fluconazole  (DIFLUCAN ) 100 MG tablet Take 2 tablets (200 mg total) by mouth daily for 12 days. 12/14/23 12/26/23  [name], [name], MD  gabapentin  (NEURONTIN ) 300 MG capsule Take 1 capsule (300 mg total) by mouth 3 (three) times daily. Patient not taking: Reported on 12/16/2023 05/26/23   [name], [name], MD  montelukast  (SINGULAIR ) 10 MG tablet Take 1 tablet (10 mg total) by mouth at bedtime. 05/01/23   [name], NP  oxyCODONE -acetaminophen  (PERCOCET) 7.5-325 MG tablet Take 1-2 tablets by mouth every 6 (six) hours as needed for severe pain (pain score 7-10). 12/21/23   [name], [name], NP  pantoprazole  (PROTONIX ) 40 MG tablet Take 1 tablet (40 mg total) by mouth 2 (two) times daily. 12/14/23   [name], [name], MD  polyethylene glycol powder (GLYCOLAX /MIRALAX ) 17 GM/SCOOP powder Mix 17 g in 4 oz of liquid and take by mouth 2 (two) times daily as needed. 11/30/23   [name], NP  prochlorperazine  (COMPAZINE ) 10 MG tablet Take 1 tablet (10 mg total) by mouth every 6 (six) hours as needed for nausea or vomiting. 12/07/23   [name], [name], MD  propranolol  (INDERAL ) 10 MG tablet Take 1 tablet (10 mg total) by mouth 2 (two) times daily. Please call [phone] to schedule an appointment for  future refills. Thank you. 3rd attempt. Patient not taking: Reported on 12/16/2023 06/01/23   [name], [name], DO  scopolamine  (TRANSDERM-SCOP) 1 MG/3DAYS Place 1 patch (1.5 mg total) onto the skin every 3 (three) days as needed (Dizziness, nausea or vomiting). 12/07/23   [name], [name], MD  senna (SENOKOT) 8.6 MG TABS tablet Take 2 tablets (17.2 mg total) by mouth at bedtime. Patient not taking: Reported on 12/16/2023 06/30/22   [name], [name], MD  sennosides-docusate sodium  (SENOKOT-S) 8.6-50 MG tablet Take 1 tablet by mouth daily. Patient not taking: Reported on 12/16/2023 11/30/23   [name], NP    Allergies: Patient has no known allergies.    Review of Systems  Gastrointestinal:  Positive for abdominal pain and vomiting.  All other systems reviewed and are negative.   Updated Vital Signs BP 109/76 (BP Location: Left Arm)   Pulse 94   Temp 97.6 F (36.4 C) (Oral)   Resp 19   Ht 5' 11 (1.803 m)   Wt 76.7 kg   SpO2 98%   BMI 23.57 kg/m   Physical Exam Vitals and nursing note reviewed.  Constitutional:      General: She is not in acute distress.    Appearance: Normal appearance. She is well-developed.  HENT:     Head: Normocephalic and atraumatic.  Eyes:     Conjunctiva/sclera: Conjunctivae normal.  Pupils: Pupils are equal, round, and reactive to light.  Cardiovascular:     Rate and Rhythm: Normal rate and regular rhythm.     Heart sounds: Normal heart sounds.  Pulmonary:     Effort: Pulmonary effort is normal. No respiratory distress.     Breath sounds: Normal breath sounds.  Abdominal:     General: There is no distension.     Palpations: Abdomen is soft.     Tenderness: There is no abdominal tenderness.  Musculoskeletal:        General: No deformity. Normal range of motion.     Cervical back: Normal range of motion and neck supple.  Skin:    General: Skin is warm and dry.  Neurological:     General: No focal deficit present.     Mental Status: She is  alert and oriented to person, place, and time.     (all labs ordered are listed, but only abnormal results are displayed) Labs Reviewed  CBC WITH DIFFERENTIAL/PLATELET - Abnormal; Notable for the following components:      Result Value   RBC 5.28 (*)    MCV 73.5 (*)    MCH 22.9 (*)    Abs Immature Granulocytes 0.24 (*)    All other components within normal limits  COMPREHENSIVE METABOLIC PANEL WITH GFR - Abnormal; Notable for the following components:   Potassium 2.9 (*)    Chloride 91 (*)    Glucose, Bld 143 (*)    BUN 53 (*)    Creatinine, Ser 3.23 (*)    Calcium  7.0 (*)    Total Protein 9.1 (*)    AST 139 (*)    ALT 307 (*)    Alkaline Phosphatase 351 (*)    GFR, Estimated 17 (*)    Anion gap 19 (*)    All other components within normal limits  I-STAT CHEM 8, ED - Abnormal; Notable for the following components:   Potassium 3.1 (*)    Chloride 94 (*)    BUN 55 (*)    Creatinine, Ser 3.20 (*)    Glucose, Bld 141 (*)    Calcium , Ion 0.79 (*)    All other components within normal limits  URINALYSIS, W/ REFLEX TO CULTURE (INFECTION SUSPECTED)  I-STAT CG4 LACTIC ACID, ED  I-STAT CG4 LACTIC ACID, ED    EKG: EKG Interpretation Date/Time:  Thursday December 22 2023 15:56:38 EDT Ventricular Rate:  88 PR Interval:  132 QRS Duration:  110 QT Interval:  458 QTC Calculation: 555 R Axis:   9  Text Interpretation: Sinus rhythm Prolonged QT interval Confirmed by Laurice Coy [phone]) on 12/22/2023 4:22:51 PM  Radiology: No results found.   Procedures   Medications Ordered in the ED  potassium chloride  10 mEq in 100 mL IVPB (has no administration in time range)  sodium chloride  0.9 % bolus 1,000 mL (1,000 mLs Intravenous New Bag/Given 12/22/23 1556)  ondansetron  (ZOFRAN ) injection 4 mg (4 mg Intravenous Given 12/22/23 1555)                                    Medical Decision Making Amount and/or Complexity of Data Reviewed Labs: ordered. Radiology:  ordered.  Risk Prescription drug management.    Medical Screen Complete  This patient presented to the ED with complaint of nausea, vomiting.  This complaint involves an extensive number of treatment options. The initial differential diagnosis includes, but is  not limited to, AKI, metabolic abnormality, etc.  This presentation is: Acute, Chronic, Self-Limited, Previously Undiagnosed, Uncertain Prognosis, Complicated, Systemic Symptoms, and Threat to Life/Bodily Function Patient with significant nausea and vomiting.  Patient with noted AKI and elevated LFTs in oncology clinic.  She was sent for evaluation and admission.  IV fluids, electrolyte placement initiated.  Patient will benefit from admission.  Hospitalist service made aware of case. Additional history obtained:  External records from outside sources obtained and reviewed including prior ED visits and prior Inpatient records.    Problem List / ED Course:  Nausea, vomiting, AKI, hypokalemia, hypocalcemia   Disposition:  After consideration of the diagnostic results and the patients response to treatment, I feel that the patent would benefit from admission.       Final diagnoses:  AKI (acute kidney injury) (HCC)  Nausea and vomiting, unspecified vomiting type    ED Discharge Orders     None          Laurice Maude BROCKS, MD 12/22/23 [phone]

## 2023-12-22 NOTE — Assessment & Plan Note (Signed)
 Continue fluconazole  but changed to IV

## 2023-12-22 NOTE — Assessment & Plan Note (Signed)
 12/03/2020: Bilateral breast biopsies: Right breast: T2N1 stage IIa grade 2 ILC, ER/PR positive, HER2 negative, Ki-67 40%, right axillary lymph node positive with extracapsular extension MammaPrint: High risk 01/27/2021 -06/08/2021 neoadjuvant chemotherapy with dose dense Adriamycin  and Cytoxan  x4 followed by Taxol  x11 METASTATIC DISEASE Right chest wall pain: CT chest 05/13/2022: Multifocal bone metastases thoracic spine and scattered rib lesions, no nodal or visceral metastases.  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 11/21/2022: CT CAP: Bone mets unchanged, 5 mm right lobe of the liver: Stable sigmoid colon: Mild diverticulitis 11/21/2022: Bone scan: Mild activity at L3 vertebral body compression fracture, stable 12/15/2022: MRI lumbar spine: Stable pathologic fracture L3 stable other bone mets 06/10/2023: PET/CT: Interval development of hypermetabolic lymphadenopathy left submandibular region, throughout mediastinal, bilateral hilar, upper abdomen (no uptake in the bones) 07/06/2023: Submandibular lymph node biopsy: Benign  ----------------------------------------------------------------------------------------------------------------------------- Current treatment: VerzEnio  (started 05/21/2022) with anastrozole --Verzenio  on hold beginning 12/05/2023 (discontinued from med list)  Assessment and Plan Assessment & Plan Dehydration Severe dehydration due to nausea and vomiting causing hypotension and electrolyte imbalances. I cautioned her that her critically low blood  pressure poses fatal risk. - Administer IV fluids immediately. - Transfer to ER for continued IV fluid administration and monitoring. - She vocalized refusal to go to ER due to long stays and still having issues leading to her return.    Acute kidney injury Acute kidney injury secondary to severe dehydration with creatinine rising to 3.2. Cautioned patient this is secondary to her dehydration from nausea/vomiting.  I recommended immediate intervention needed to prevent permanent damage. - Transfer to ER for continued IV fluid administration and monitoring and possible admission. - Monitor kidney function closely in the hospital.  Nausea and vomiting Persistent nausea and vomiting contributing to dehydration and weight loss. Requires gastroenterologist evaluation. - Transfer to ER for evaluation and management. - Consult with gastroenterology for further assessment and treatment.  Esophageal stenosis Known esophageal stenosis may require further dilation or endoscopy due to worsening symptoms and weight loss. - Transfer to ER for potential endoscopy and further management. - Consult with gastroenterology for evaluation of esophageal stenosis  Weight loss Significant weight loss likely due to nausea, vomiting, and esophageal stricture. Urgent evaluation needed to prevent further loss. - Transfer to ER for comprehensive evaluation and management.  Elevated liver enzymes Significantly elevated liver enzymes require further investigation. Possible liver insult or injury, etiology unclear. - Transfer to ER for imaging and further evaluation of liver function.  I reached out to Dr. [name] in GI who agrees with patient going to ER, and also reviewed the above with Dr. [name].  Patient most recent imaging on PET scan didn't show any obvious cancer related cause of this acute issue.    I met with patient's children after the initial encounter, and reviewed each hospitalization, the concerns,  and my recommendations for more IV fluids and imaging to understand the etiology of her persistent nausea and vomiting.  They are understandably concerned at the multiple hospital encounters with no resolution of her symptoms and the time between hospitalization and follow-up.  They are considering a second opinion and  [facility] or [place].    I met with Lylie after she received IV fluids.  Her BP is improved.  She asked if she could please go home, however I reviewed that I am still concerned that she is unable to keep anything down, that she has had persistent weight loss, and her kidney and liver function elevation.  Her mom came up to the cancer center, and the patient was agreeable to go to the ER.

## 2023-12-22 NOTE — Assessment & Plan Note (Signed)
 History of candidiasis and Candida esophagitis.  Continue fluconazole  but changed to IV

## 2023-12-22 NOTE — Telephone Encounter (Signed)
 I called and spoke with patient and her son. Chelsea Hansen informed me that patient needed to be seen because she has not had a BM in 3 weeks despite taking stool softeners/laxatives. No stool at all. Patient is not able to keep liquids down, continues to have weight loss, and vomiting. I informed Chelsea Hansen multiple times that patient needed to go to ER because these are emergent symptoms that need to be addressed. I informed them that patient may have a possible obstruction and there is nothing that we can do as an outpatient office. Patient had an appt today and couldn't make it because she is weak, I advised them that this is another reason that patient should go to ER. Chelsea Hansen states that patient needs to see her GI provider, I told him that they would send her right to the hospital. They do not want to go to the ER. Chelsea Hansen confirmed that patient was scheduled for a follow up on 01/05/24 and there were no further questions.

## 2023-12-22 NOTE — Assessment & Plan Note (Signed)
 Likely in the setting of dehydration obtain urine electrolytes CT scan showing no evidence of obstruction Continue to follow renal function  -chronic avoid nephrotoxic medications such as NSAIDs, Vanco Zosyn combo,  avoid hypotension, continue to follow renal function

## 2023-12-22 NOTE — Telephone Encounter (Signed)
 Pt was transported w/family alongside, to ER for further evaluation after IVF at [facility]. Report was given to [facility], Charity fundraiser. Pt was stable upon leaving ER.

## 2023-12-22 NOTE — Assessment & Plan Note (Signed)
 CT scan worrisome for abnormal pancreatic finding.  At some point may benefit from further evaluation using MRI at this point would hold off on contrasted MRI studies given AKI

## 2023-12-22 NOTE — Assessment & Plan Note (Signed)
 Not on statin

## 2023-12-22 NOTE — Assessment & Plan Note (Signed)
 In the setting of dehydration check CK level CT abdomen: No evidence of obstructive picture

## 2023-12-23 ENCOUNTER — Inpatient Hospital Stay (HOSPITAL_COMMUNITY): Payer: MEDICAID

## 2023-12-23 DIAGNOSIS — R1114 Bilious vomiting: Secondary | ICD-10-CM | POA: Diagnosis not present

## 2023-12-23 DIAGNOSIS — I129 Hypertensive chronic kidney disease with stage 1 through stage 4 chronic kidney disease, or unspecified chronic kidney disease: Secondary | ICD-10-CM | POA: Diagnosis present

## 2023-12-23 DIAGNOSIS — R443 Hallucinations, unspecified: Secondary | ICD-10-CM | POA: Diagnosis not present

## 2023-12-23 DIAGNOSIS — B3781 Candidal esophagitis: Secondary | ICD-10-CM | POA: Diagnosis present

## 2023-12-23 DIAGNOSIS — R627 Adult failure to thrive: Secondary | ICD-10-CM | POA: Diagnosis present

## 2023-12-23 DIAGNOSIS — R112 Nausea with vomiting, unspecified: Secondary | ICD-10-CM | POA: Diagnosis not present

## 2023-12-23 DIAGNOSIS — E861 Hypovolemia: Secondary | ICD-10-CM | POA: Diagnosis present

## 2023-12-23 DIAGNOSIS — R935 Abnormal findings on diagnostic imaging of other abdominal regions, including retroperitoneum: Secondary | ICD-10-CM

## 2023-12-23 DIAGNOSIS — R748 Abnormal levels of other serum enzymes: Secondary | ICD-10-CM

## 2023-12-23 DIAGNOSIS — K209 Esophagitis, unspecified without bleeding: Secondary | ICD-10-CM | POA: Diagnosis not present

## 2023-12-23 DIAGNOSIS — E440 Moderate protein-calorie malnutrition: Secondary | ICD-10-CM | POA: Diagnosis not present

## 2023-12-23 DIAGNOSIS — E860 Dehydration: Secondary | ICD-10-CM | POA: Diagnosis present

## 2023-12-23 DIAGNOSIS — K2289 Other specified disease of esophagus: Secondary | ICD-10-CM | POA: Diagnosis not present

## 2023-12-23 DIAGNOSIS — I471 Supraventricular tachycardia, unspecified: Secondary | ICD-10-CM | POA: Diagnosis present

## 2023-12-23 DIAGNOSIS — G893 Neoplasm related pain (acute) (chronic): Secondary | ICD-10-CM | POA: Diagnosis not present

## 2023-12-23 DIAGNOSIS — N179 Acute kidney failure, unspecified: Secondary | ICD-10-CM | POA: Diagnosis present

## 2023-12-23 DIAGNOSIS — K449 Diaphragmatic hernia without obstruction or gangrene: Secondary | ICD-10-CM | POA: Diagnosis not present

## 2023-12-23 DIAGNOSIS — K3189 Other diseases of stomach and duodenum: Secondary | ICD-10-CM | POA: Diagnosis not present

## 2023-12-23 DIAGNOSIS — C7951 Secondary malignant neoplasm of bone: Secondary | ICD-10-CM | POA: Diagnosis present

## 2023-12-23 DIAGNOSIS — K8689 Other specified diseases of pancreas: Secondary | ICD-10-CM | POA: Diagnosis not present

## 2023-12-23 DIAGNOSIS — K838 Other specified diseases of biliary tract: Secondary | ICD-10-CM | POA: Diagnosis not present

## 2023-12-23 DIAGNOSIS — E785 Hyperlipidemia, unspecified: Secondary | ICD-10-CM | POA: Diagnosis present

## 2023-12-23 DIAGNOSIS — D631 Anemia in chronic kidney disease: Secondary | ICD-10-CM | POA: Diagnosis present

## 2023-12-23 DIAGNOSIS — K869 Disease of pancreas, unspecified: Secondary | ICD-10-CM | POA: Diagnosis not present

## 2023-12-23 DIAGNOSIS — R634 Abnormal weight loss: Secondary | ICD-10-CM | POA: Diagnosis not present

## 2023-12-23 DIAGNOSIS — J45909 Unspecified asthma, uncomplicated: Secondary | ICD-10-CM | POA: Diagnosis present

## 2023-12-23 DIAGNOSIS — F418 Other specified anxiety disorders: Secondary | ICD-10-CM | POA: Diagnosis not present

## 2023-12-23 DIAGNOSIS — E876 Hypokalemia: Secondary | ICD-10-CM | POA: Diagnosis not present

## 2023-12-23 DIAGNOSIS — Z515 Encounter for palliative care: Secondary | ICD-10-CM | POA: Diagnosis not present

## 2023-12-23 DIAGNOSIS — D509 Iron deficiency anemia, unspecified: Secondary | ICD-10-CM | POA: Diagnosis not present

## 2023-12-23 DIAGNOSIS — I10 Essential (primary) hypertension: Secondary | ICD-10-CM | POA: Diagnosis not present

## 2023-12-23 DIAGNOSIS — C250 Malignant neoplasm of head of pancreas: Secondary | ICD-10-CM | POA: Diagnosis present

## 2023-12-23 DIAGNOSIS — R933 Abnormal findings on diagnostic imaging of other parts of digestive tract: Secondary | ICD-10-CM | POA: Diagnosis not present

## 2023-12-23 DIAGNOSIS — F32A Depression, unspecified: Secondary | ICD-10-CM | POA: Diagnosis present

## 2023-12-23 DIAGNOSIS — N1831 Chronic kidney disease, stage 3a: Secondary | ICD-10-CM | POA: Diagnosis present

## 2023-12-23 DIAGNOSIS — Z7189 Other specified counseling: Secondary | ICD-10-CM | POA: Diagnosis not present

## 2023-12-23 DIAGNOSIS — K315 Obstruction of duodenum: Secondary | ICD-10-CM | POA: Diagnosis present

## 2023-12-23 DIAGNOSIS — R109 Unspecified abdominal pain: Secondary | ICD-10-CM | POA: Diagnosis not present

## 2023-12-23 DIAGNOSIS — C50411 Malignant neoplasm of upper-outer quadrant of right female breast: Secondary | ICD-10-CM | POA: Diagnosis not present

## 2023-12-23 DIAGNOSIS — I899 Noninfective disorder of lymphatic vessels and lymph nodes, unspecified: Secondary | ICD-10-CM | POA: Diagnosis not present

## 2023-12-23 DIAGNOSIS — E669 Obesity, unspecified: Secondary | ICD-10-CM | POA: Diagnosis present

## 2023-12-23 DIAGNOSIS — C50911 Malignant neoplasm of unspecified site of right female breast: Secondary | ICD-10-CM | POA: Diagnosis present

## 2023-12-23 DIAGNOSIS — Z170 Estrogen receptor positive status [ER+]: Secondary | ICD-10-CM | POA: Diagnosis not present

## 2023-12-23 DIAGNOSIS — R1013 Epigastric pain: Secondary | ICD-10-CM | POA: Diagnosis not present

## 2023-12-23 DIAGNOSIS — K222 Esophageal obstruction: Secondary | ICD-10-CM | POA: Diagnosis not present

## 2023-12-23 DIAGNOSIS — R1110 Vomiting, unspecified: Secondary | ICD-10-CM | POA: Diagnosis not present

## 2023-12-23 DIAGNOSIS — R978 Other abnormal tumor markers: Secondary | ICD-10-CM | POA: Diagnosis not present

## 2023-12-23 DIAGNOSIS — E8809 Other disorders of plasma-protein metabolism, not elsewhere classified: Secondary | ICD-10-CM | POA: Diagnosis present

## 2023-12-23 LAB — COMPREHENSIVE METABOLIC PANEL WITH GFR
ALT: 240 U/L — ABNORMAL HIGH (ref 0–44)
AST: 106 U/L — ABNORMAL HIGH (ref 15–41)
Albumin: 3.4 g/dL — ABNORMAL LOW (ref 3.5–5.0)
Alkaline Phosphatase: 320 U/L — ABNORMAL HIGH (ref 38–126)
Anion gap: 15 (ref 5–15)
BUN: 44 mg/dL — ABNORMAL HIGH (ref 6–20)
CO2: 23 mmol/L (ref 22–32)
Calcium: 7.1 mg/dL — ABNORMAL LOW (ref 8.9–10.3)
Chloride: 97 mmol/L — ABNORMAL LOW (ref 98–111)
Creatinine, Ser: 2.13 mg/dL — ABNORMAL HIGH (ref 0.44–1.00)
GFR, Estimated: 28 mL/min — ABNORMAL LOW (ref >60)
Glucose, Bld: 109 mg/dL — ABNORMAL HIGH (ref 70–99)
Potassium: 2.6 mmol/L — CL (ref 3.5–5.1)
Sodium: 135 mmol/L (ref 135–145)
Total Bilirubin: 0.9 mg/dL (ref 0.0–1.2)
Total Protein: 7.1 g/dL (ref 6.5–8.1)

## 2023-12-23 LAB — PHOSPHORUS: Phosphorus: 2.9 mg/dL (ref 2.5–4.6)

## 2023-12-23 LAB — PREALBUMIN: Prealbumin: 20 mg/dL (ref 18–38)

## 2023-12-23 LAB — CBC
HCT: 32 % — ABNORMAL LOW (ref 36.0–46.0)
Hemoglobin: 9.9 g/dL — ABNORMAL LOW (ref 12.0–15.0)
MCH: 23.1 pg — ABNORMAL LOW (ref 26.0–34.0)
MCHC: 30.9 g/dL (ref 30.0–36.0)
MCV: 74.8 fL — ABNORMAL LOW (ref 80.0–100.0)
Platelets: 253 K/uL (ref 150–400)
RBC: 4.28 MIL/uL (ref 3.87–5.11)
RDW: 14.6 % (ref 11.5–15.5)
WBC: 6.3 K/uL (ref 4.0–10.5)
nRBC: 0 % (ref 0.0–0.2)

## 2023-12-23 LAB — OSMOLALITY: Osmolality: 302 mosm/kg — ABNORMAL HIGH (ref 275–295)

## 2023-12-23 LAB — HEPATITIS PANEL, ACUTE
HCV Ab: NONREACTIVE
Hep A IgM: NONREACTIVE
Hep B C IgM: NONREACTIVE
Hepatitis B Surface Ag: NONREACTIVE

## 2023-12-23 LAB — TSH: TSH: 2.065 u[IU]/mL (ref 0.350–4.500)

## 2023-12-23 LAB — MAGNESIUM: Magnesium: 2.5 mg/dL — ABNORMAL HIGH (ref 1.7–2.4)

## 2023-12-23 LAB — POTASSIUM: Potassium: 3.5 mmol/L (ref 3.5–5.1)

## 2023-12-23 MED ORDER — ADULT MULTIVITAMIN W/MINERALS CH
1.0000 | ORAL_TABLET | Freq: Every day | ORAL | Status: DC
  Administered 2023-12-29: 1 via ORAL
  Filled 2023-12-23 (×5): qty 1

## 2023-12-23 MED ORDER — POTASSIUM CHLORIDE 20 MEQ PO PACK
40.0000 meq | PACK | Freq: Once | ORAL | Status: AC
  Administered 2023-12-23: 40 meq via ORAL
  Filled 2023-12-23: qty 2

## 2023-12-23 MED ORDER — SODIUM CHLORIDE 0.9 % IV SOLN
4.0000 g | Freq: Once | INTRAVENOUS | Status: AC
  Administered 2023-12-23: 4 g via INTRAVENOUS
  Filled 2023-12-23: qty 40

## 2023-12-23 MED ORDER — POTASSIUM CHLORIDE 10 MEQ/100ML IV SOLN
10.0000 meq | INTRAVENOUS | Status: AC
  Administered 2023-12-23 (×4): 10 meq via INTRAVENOUS
  Filled 2023-12-23 (×4): qty 100

## 2023-12-23 MED ORDER — GADOBUTROL 1 MMOL/ML IV SOLN
7.0000 mL | Freq: Once | INTRAVENOUS | Status: AC | PRN
  Administered 2023-12-23: 7 mL via INTRAVENOUS

## 2023-12-23 MED ORDER — LORAZEPAM 2 MG/ML IJ SOLN
0.5000 mg | Freq: Once | INTRAMUSCULAR | Status: AC
  Administered 2023-12-23: 0.5 mg via INTRAVENOUS
  Filled 2023-12-23: qty 1

## 2023-12-23 MED ORDER — VITAMIN D (ERGOCALCIFEROL) 1.25 MG (50000 UNIT) PO CAPS
50000.0000 [IU] | ORAL_CAPSULE | ORAL | Status: DC
  Filled 2023-12-23 (×4): qty 1

## 2023-12-23 MED ORDER — BISACODYL 10 MG RE SUPP
10.0000 mg | Freq: Once | RECTAL | Status: AC
  Administered 2023-12-23: 10 mg via RECTAL
  Filled 2023-12-23: qty 1

## 2023-12-23 MED ORDER — BOOST / RESOURCE BREEZE PO LIQD CUSTOM
1.0000 | Freq: Three times a day (TID) | ORAL | Status: DC
  Administered 2023-12-23 – 2023-12-28 (×5): 1 via ORAL

## 2023-12-23 MED ORDER — MORPHINE SULFATE (PF) 2 MG/ML IV SOLN
1.0000 mg | INTRAVENOUS | Status: DC | PRN
  Administered 2023-12-23 – 2023-12-25 (×4): 2 mg via INTRAVENOUS
  Administered 2023-12-27: 1 mg via INTRAVENOUS
  Administered 2023-12-28 – 2023-12-30 (×10): 2 mg via INTRAVENOUS
  Filled 2023-12-23 (×15): qty 1

## 2023-12-23 MED ORDER — SODIUM CHLORIDE 0.9 % IV SOLN: INTRAVENOUS | Status: AC

## 2023-12-23 NOTE — Progress Notes (Signed)
 Initial Nutrition Assessment  DOCUMENTATION CODES:   Not applicable  INTERVENTION:   Boost Breeze po TID, each supplement provides 250 kcal and 9 grams of protein  MVI po daily   Thiamine  100mg  IV daily   Ergocalciferol  50,000 units po weekly for 6 weeks.   Pt at high refeed risk; recommend monitor potassium, magnesium  and phosphorus labs daily until stable  Daily weights  NUTRITION DIAGNOSIS:   Inadequate oral intake related to acute illness as evidenced by per patient/family report.  GOAL:   Patient will meet greater than or equal to 90% of their needs  MONITOR:   PO intake, Supplement acceptance, Labs, Weight trends, I & O's, Skin  REASON FOR ASSESSMENT:   Consult Assessment of nutrition requirement/status  ASSESSMENT:   53 y.o. 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  RD working remotely.  Pt is known to the nutrition department from a recent previous admission. Spoke with patient via phone. Pt reports poor oral intake for the past month r/t nausea, vomiting and abdomina pain. Pt reports that her appetite has been fair and she is able to eat food at home but reports that she will just throw it back up. Per chart, pt is down 35lbs(17%) over the past 3 months; this is severe weight loss. Pt reports ongoing constipation for the past month but reports this is improving. Pt is noted to have esophagitis with candida and esophageal stenosis which was recently dilated. Pt reports that she has not had any vomiting today. Pt did eat her clear liquid diet this  morning. In the past, patient has reported that she did not like Ensure but reports that she is willing to try it again if she does not like the [employer]. Pt is at high refeed risk. RD discussed with pt the importance of adequate nutrition needed to preserve lean muscle. RD will obtain exam at follow up. Plan is for MRCP.   Pt with noted vitamin D  and thiamine  deficiencies; will provide supplementation.    Medications reviewed and include: protonix , miralax , thiamine , Ca gluconate, diflucan   Labs reviewed: K 3.5 wnl, BUN 44(H), creat 2.13(H), P 2.9 wnl, Mg 2.5(H) iCa- 0.79(L)- 7/3 Lipase 52(H)- 7/3 B1 58.2(L), B12 485 wnl- 6/18 Vitamin D  7.4(L)- 6/22 Hgb 9.9(L), Hct 32.0(L), MCV 74.8(L), MCH 23.1(L)  NUTRITION - FOCUSED PHYSICAL EXAM: Unable to perform at this time   Diet Order:   Diet Order             Diet clear liquid Room service appropriate? Yes; Fluid consistency: Thin  Diet effective now                  EDUCATION NEEDS:   No education needs have been identified at this time  Skin:  Skin Assessment: Reviewed RN Assessment  Last BM:  7/4- constipation  Height:   Ht Readings from Last 1 Encounters:  12/22/23 5' 11 (1.803 m)    Weight:   Wt Readings from Last 1 Encounters:  12/22/23 76.7 kg    Ideal Body Weight:  70 kg  BMI:  Body mass index is 23.58 kg/m.  Estimated Nutritional Needs:   Kcal:  2000-2300kcal/day  Protein:  100-115g/day  Fluid:  2.1-2.4L/day  Augustin Shams MS, RD, LDN If unable to be reached, please send secure chat to RD inpatient available from 8:00a-4:00p daily

## 2023-12-23 NOTE — Progress Notes (Signed)
 PROGRESS NOTE    Chelsea Hansen  FMW:[id number] DOB: 1970/10/04 DOA: 12/22/2023 PCP: [name], NP   Brief Narrative: Chelsea Hansen is a 53 y.o. female with a history of metastatic breast cancer, duodenal stenosis, esophageal candidiasis, constipation, hyperlipidemia, hypertension, anemia, DVT, asthma.  Patient presented secondary to abdominal pain and recurrent emesis with concern for possible biliary process and with associated AKI.  Imaging concerning for possible pancreatic lesion in addition to possible gallbladder pathology.  Patient started on IV fluids.  Gastroenterology consulted for recommendations.   Assessment and Plan:  AKI on CKD IIIa Secondary to intractable nausea and vomiting with resultant hypovolemia. Baseline creatinine of about 1. Creatinine of 3.25 on admission. Patient started on IV fluids. - Continue IV fluids - Daily BMP  Nausea and Vomiting Present on admission.  Unclear etiology but possibly related to biliary process. - Continue Zofran  and Reglan  as needed  Hypokalemia Secondary to GI losses from recurrent emesis. - Potassium supplementation as needed  Elevated AST/ALT Elevated Lipase Pancreatic mass CT chest/abdomen/pelvis and right upper quadrant ultrasound obtained on admission and significant for evidence of stranding of the proximal pancreas with possible underlying vague mass, borderline gallbladder wall thickening, with no evidence of cholelithiasis or pericholecystic fluid.  Gastroenterology was consulted on admission.  LFTs trending down. - Gastroenterology recommendations: MRCP ordered  Candida esophagitis Patient diagnosed on 6/24. Patient has been on fluconazole  as an outpatient, but with recurrent emesis, has not received consistent dosing. Patient started on fluconazole . - Continue fluconazole  per GI recommendations  Hypocalcemia Low, even accounting for albumin. -Calcium  gluconate -Repeat BMP in  a.m.  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 [name] as an outpatient and is on anastrazole. She was previously on abemaciclib , which has been held.  CT this admission confirms metastatic osseous lesions.  Hyperlipidemia Noted. Patient is not on statin therapy as an outpatient.  Chronic pain Patient is on Percocet and gabapentin  as an outpatient. - Morphine  IV as needed while still having recurrent vomiting   DVT prophylaxis: SCDs Code Status:   Code Status: Full Code Family Communication: None at bedside Disposition Plan: Discharge pending transition off IV fluids, ability to advance diet, ongoing GI recommendations   Consultants:  Gastroenterology  Procedures:  None  Antimicrobials: None    Subjective: Patient reports no emesis overnight.  Is hoping to advance her diet to something soft.  Objective: BP 95/64 (BP Location: Right Arm)   Pulse 85   Temp 98.2 F (36.8 C) (Oral)   Resp 18   Ht 5' 11 (1.803 m)   Wt 76.7 kg   SpO2 98%   BMI 23.58 kg/m   Examination:  General exam: Appears calm and comfortable Respiratory system: Clear to auscultation. Respiratory effort normal. Cardiovascular system: S1 & S2 heard, RRR. No murmurs, rubs, gallops or clicks. Gastrointestinal system: Abdomen is nondistended, soft and nontender. Normal bowel sounds heard. Central nervous system: Alert and oriented. No focal neurological deficits. Musculoskeletal: No edema. No calf tenderness Skin: No cyanosis. No rashes Psychiatry: Judgement and insight appear normal. Mood & affect appropriate.    Data Reviewed: I have personally reviewed following labs and imaging studies  CBC Lab Results  Component Value Date   WBC 6.3 12/23/2023   RBC 4.28 12/23/2023   HGB 9.9 (L) 12/23/2023   HCT 32.0 (L) 12/23/2023   MCV 74.8 (L) 12/23/2023   MCH 23.1 (L) 12/23/2023   PLT 253 12/23/2023   MCHC  30.9 12/23/2023   RDW 14.6  12/23/2023   LYMPHSABS 1.0 12/22/2023   MONOABS 0.5 12/22/2023   EOSABS 0.0 12/22/2023   BASOSABS 0.0 12/22/2023     Last metabolic panel Lab Results  Component Value Date   NA 135 12/23/2023   K 2.6 (LL) 12/23/2023   CL 97 (L) 12/23/2023   CO2 23 12/23/2023   BUN 44 (H) 12/23/2023   CREATININE 2.13 (H) 12/23/2023   GLUCOSE 109 (H) 12/23/2023   GFRNONAA 28 (L) 12/23/2023   GFRAA >60 09/07/2019   CALCIUM  7.1 (L) 12/23/2023   PHOS 2.9 12/23/2023   PROT 7.1 12/23/2023   ALBUMIN 3.4 (L) 12/23/2023   LABGLOB 2.8 11/30/2023   AGRATIO 1.5 10/06/2020   BILITOT 0.9 12/23/2023   ALKPHOS 320 (H) 12/23/2023   AST 106 (H) 12/23/2023   ALT 240 (H) 12/23/2023   ANIONGAP 15 12/23/2023    GFR: Estimated Creatinine Clearance: 34.9 mL/min (A) (by C-G formula based on SCr of 2.13 mg/dL (H)).  No results found for this or any previous visit (from the past 240 hours).    Radiology Studies: US  Abdomen Limited RUQ (LIVER/GB) Result Date: 12/23/2023 CLINICAL DATA:  Elevated LFTs EXAM: ULTRASOUND ABDOMEN LIMITED RIGHT UPPER QUADRANT COMPARISON:  None Available. FINDINGS: Gallbladder: Patient experienced some pain during the exam. The sonographer did not no whether this constituted a sonographic Murphy's sign. The sonographer is unable to be reached at this time. Borderline wall thickening (3 mm). No cholelithiasis. No pericholecystic fluid. Common bile duct: Diameter: 5 mm Liver: No focal lesion identified. Within normal limits in parenchymal echogenicity. Portal vein is patent on color Doppler imaging with normal direction of blood flow towards the liver. Other: None. IMPRESSION: 1. Borderline gallbladder wall thickening. No cholelithiasis or pericholecystic fluid. Patient had pain during the exam but it is unclear if this constitutes a sonographic Murphy's sign. If there is concern for acute cholecystitis consider HIDA scan for further evaluation. 2. No biliary dilatation. Electronically Signed   By:  Norman Gatlin M.D.   On: 12/23/2023 00:32   CT CHEST ABDOMEN PELVIS WO CONTRAST Result Date: 12/22/2023 CLINICAL DATA:  Bowel obstruction suspected nausea, vomiting, abdominal pain, and per cancer center hypotension. Pt has not been able to keep food or drink down. Stated she did an enema on Monday and only had a small amount of output. Also stated she has lost almost 20lbs EXAM: CT CHEST, ABDOMEN AND PELVIS WITHOUT CONTRAST TECHNIQUE: Multidetector CT imaging of the chest, abdomen and pelvis was performed following the standard protocol without IV contrast. RADIATION DOSE REDUCTION: This exam was performed according to the departmental dose-optimization program which includes automated exposure control, adjustment of the mA and/or kV according to patient size and/or use of iterative reconstruction technique. COMPARISON:  CT chest 12/12/2023, CT abdomen pelvis 12/10/2023, PET CT 11/23/2023, CT abdomen pelvis 11/17/2022 FINDINGS: CT CHEST FINDINGS Cardiovascular: Normal heart size. No significant pericardial effusion. The thoracic aorta is normal in caliber. No atherosclerotic plaque of the thoracic aorta. No coronary artery calcifications. Mediastinum/Nodes: No gross hilar adenopathy, noting limited sensitivity for the detection of hilar adenopathy on this noncontrast study. No enlarged mediastinal or axillary lymph nodes. Thyroid  gland, trachea, and esophagus demonstrate no significant findings. Lungs/Pleura: Similar-appearing bilateral upper lung zone, right greater than left, centrilobular pulmonary micronodules. No pulmonary mass. No pleural effusion. No pneumothorax. Musculoskeletal: Postsurgical changes of the bilateral breasts. No suspicious lytic or blastic osseous lesions. No acute displaced fracture. Multilevel degenerative changes of the spine. CT ABDOMEN  PELVIS FINDINGS Hepatobiliary: No focal liver abnormality. No gallstones, gallbladder wall thickening, or pericholecystic fluid. No biliary  dilatation. Pancreas: Persistent vague fat stranding along the proximal pancreas with query underlying vague mass (2:68). No surrounding inflammatory changes. No main pancreatic ductal dilatation. Spleen: Normal in size without focal abnormality. Adrenals/Urinary Tract: No adrenal nodule bilaterally. No nephrolithiasis and no hydronephrosis. No definite contour-deforming renal mass. No ureterolithiasis or hydroureter. The urinary bladder is unremarkable. Stomach/Bowel: Stomach is within normal limits. No evidence of bowel wall thickening or dilatation. Appendix appears normal. Vascular/Lymphatic: No abdominal aorta or iliac aneurysm. No abdominal, pelvic, or inguinal lymphadenopathy. Reproductive: Status post hysterectomy. No adnexal masses. Other: No intraperitoneal free fluid. No intraperitoneal free gas. No organized fluid collection. Musculoskeletal: No abdominal wall hernia or abnormality. Persistent scattered axial and appendicular sclerotic osseous metastatic lesions. L3 vertebral body hemangioma with stable chronic vertebral body height loss. No acute displaced fracture. Multilevel degenerative changes of the spine. IMPRESSION: 1. Limited evaluation on this noncontrast study. 2. Similar-appearing bilateral upper lung zone, right greater than left, centrilobular pulmonary micronodules. Finding may represent infection/inflammation. Underlying malignancy not excluded. 3. Persistent vague fat stranding along the proximal pancreas with query underlying vague mass. Correlate with lipase levels. If clinically indicated, consider MRI pancreatic protocol for further evaluation. 4. Persistent scattered axial and appendicular sclerotic osseous metastatic lesions. 5. Colonic diverticulosis with no acute diverticulitis. Electronically Signed   By: Morgane  Naveau M.D.   On: 12/22/2023 17:55      LOS: 0 days    Elgin Lam, MD [facility] 12/23/2023, 7:25 AM   If 7PM-7AM, please contact  night-coverage [email or web address]

## 2023-12-23 NOTE — Consult Note (Signed)
 Referring Provider: Dr. Briana, [facility]          Primary Care Physician:  [name], NP Primary Gastroenterologist:  Dr. Aneita Brand           Reason for Consultation: Nausea, vomiting, upper abdominal pain elevated liver enzymes                  ASSESSMENT /  PLAN   53 y.o. 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 intake and upper abdominal pain with weight loss.  N/V/upper abd pain/abnl pancreas on CT/elevated LFTs--she was found to have significant of esophagitis but also duodenal stricture dilated 10 days ago.  While duodenal stenosis could cause nausea vomiting I am most suspicious that her symptoms are related to underlying pancreatic process.  The CT did not show any significant evidence for gastric outlet obstruction though there was no contrast.  I have discussed findings at length with the patient and her family today and recommend the following -- MRI abdomen with and without contrast plus MRCP -- Okay for clear liquid diet as tolerated -- No immediate plan for repeat upper endoscopy until we obtain MRI/MRCP -- Trend liver enzymes daily -- Antiemetics as needed  2.  Esophagitis and Candida infection --seen at EGD 10 days ago unable to take oral meds at home -- Twice daily IV PPI -- Fluconazole  IV; 14-day therapy should be sufficient  3.  Duodenal stricture --unclear etiology though dilated to 12 mm 10 days ago.  May need repeat dilation but await pancreatic imaging, see #1  4.  Constipation --related to poor p.o. intake -- Repeat Dulcolax suppository 10 mg today  5.   Hypokalemia -- Replacement per primary team goal greater than 3.5  6.  Acute on chronic renal insufficiency --hypovolemia due to poor p.o. intake dehydration -- IV hydration -- Per primary team        HPI:     Chelsea Hansen is a 53 y.o. 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 intake and upper abdominal pain with weight loss.  She is seen today with her mother and father at bedside.  She was admitted on 12/11/2023 and seen by the GI consult service at which point she had an upper endoscopy with findings described above.  Since going home she has been unable to eat or drink and got weaker and weaker.  She reports a 20 pound weight loss in 9 days but a 50 pound weight loss in 1 to 2 months.  She has had severe upper abdominal pain radiating to her back.  Nausea vomiting and inability to take any liquids.  She was having presyncopal symptoms and unable to take her medications.  She is also felt constipated with last bowel movement 4 to 5 days ago after an enema.  On presentation she was found to have acute on chronic kidney  injury, hypokalemia, elevated liver enzymes and an abnormal CT scan.  CT scan as below but pertinent findings include Persistent vague fat stranding along the proximal pancreas with query of an underlying mass.  Persistent sclerotic osseous metastatic lesions and colonic diverticulosis without diverticulitis.  There were micronodules in the bilateral upper lung zones right greater than left.  Ultrasound showed borderline wall thickening without stones.  Equivocal Murphy  sign.  No biliary ductal dilatation.  Past Medical History:  Diagnosis Date   Allergy    seasonal allergies   Anxiety    on meds   Asthma    uses inhaler   Breast cancer (HCC) 2012   RIGHT lumpectomy   Cancer (HCC) 2022   RIGHT breast lump-dx 2022   Depression    on meds   DVT (deep venous thrombosis) (HCC) 2010   after hysterectomy   Family history of uterine cancer    GERD (gastroesophageal reflux disease)    with certain foods/OTC PRN meds   Headache(784.0)    History of radiation therapy    Bilateral breast- 09/03/21-11/02/21- Dr. Lynwood Nasuti   Hyperlipidemia    on meds   Hypertension    on meds   SVT (supraventricular tachycardia) (HCC)     Past Surgical History:  Procedure Laterality Date   ABDOMINAL HYSTERECTOMY     AXILLARY SENTINEL NODE BIOPSY Left 07/09/2021   Procedure: LEFT AXILLARY SENTINEL NODE BIOPSY;  Surgeon: [name], MD;  Location: [facility];  Service: General;  Laterality: Left;   BALLOON DILATION N/A 12/13/2023   Procedure: MERRILL HODGKIN;  Surgeon: Federico Rosario BROCKS, MD;  Location: [facility] END[facility]COPY;  Service: Gastroenterology;  Laterality: N/A;   BIOPSY  06/29/2022   Procedure: BIOPSY;  Surgeon: Legrand Victory LITTIE DOUGLAS, MD;  Location: [facility];  Service: Gastroenterology;;   BIOPSY OF SKIN SUBCUTANEOUS TISSUE AND/OR MUCOUS MEMBRANE  12/13/2023   Procedure: BIOPSY, SKIN, SUBCUTANEOUS TISSUE, OR MUCOUS MEMBRANE;  Surgeon: Federico Rosario BROCKS, MD;  Location[facility];  Service: Gastroenterology;;   BREAST EXCISIONAL BIOPSY Right 09/16/2009   BREAST LUMPECTOMY WITH RADIOACTIVE SEED LOCALIZATION Bilateral 07/09/2021   Procedure: BILATERAL BREAST LUMPECTOMY WITH RADIOACTIVE SEED LOCALIZATION;  Surgeon: [name], MD;  Location: [facility];  Service: General;  Laterality: Bilateral;   BREAST SURGERY     lumpectomy   ESOPHAGOGASTRODUODENOSCOPY N/A 12/13/2023   Procedure: EGD (ESOPHAGOGASTRODUODENOSCOPY);  Surgeon:  Federico Rosario BROCKS, MD;  Location: [facility] END[facility]COPY;  Service: Gastroenterology;  Laterality: N/A;   ESOPHAGOGASTRODUODENOSCOPY (EGD) WITH PROPOFOL  N/A 06/29/2022   Procedure: ESOPHAGOGASTRODUODENOSCOPY (EGD) WITH PROPOFOL ;  Surgeon: Legrand Victory LITTIE DOUGLAS, MD;  Location: [facility];  Service: Gastroenterology;  Laterality: N/A;   PORT-A-CATH REMOVAL Left 07/09/2021   Procedure: REMOVAL PORT-A-CATH;  Surgeon: [name], MD;  Location: [facility];  Service: General;  Laterality: Left;   PORTACATH PLACEMENT Left 01/26/2021   Procedure: INSERTION PORT-A-CATH;  Surgeon: [name], MD;  Location: [facility];  Service: General;  Laterality: Left;   RADIOACTIVE SEED GUIDED AXILLARY SENTINEL LYMPH NODE Right 07/09/2021   Procedure: RADIOACTIVE SEED GUIDED RIGHT AXILLARY SENTINEL LYMPH NODE DISSECTION;  Surgeon: [name], MD;  Location: [facility];  Service: General;  Laterality: Right;   TUBAL LIGATION      Prior to Admission medications   Medication Sig Start Date End Date Taking? Authorizing Provider  albuterol  (VENTOLIN  HFA) 108 (90 Base) MCG/ACT inhaler Inhale 2 puffs by mouth into the lungs every 6 hours as needed for wheezing  or shortness of breath. 03/18/23  Yes [name], NP  anastrozole  (ARIMIDEX ) 1 MG tablet Take 1 tablet (1 mg total) by mouth daily. 04/18/23  Yes Causey, Morna Pickle, NP  calcitRIOL  (ROCALTROL ) 0.5 MCG capsule Take 1 capsule (0.5 mcg total) by mouth daily. 12/15/23  Yes [name], [name], MD  Calcium  Carb-Cholecalciferol  600-10 MG-MCG TABS Take 1 tablet by mouth 3 (three) times daily. 12/14/23 01/13/24 Yes [name], [name], MD  fluconazole  (DIFLUCAN ) 100 MG tablet Take 2 tablets (200 mg total) by mouth daily for 12 days. 12/14/23 12/26/23 Yes [name], [name], MD  gabapentin  (NEURONTIN ) 300 MG capsule Take 1 capsule (300 mg total) by mouth 3 (three) times daily. 05/26/23  Yes [name], [name], MD  montelukast  (SINGULAIR ) 10 MG  tablet Take 1 tablet (10 mg total) by mouth at bedtime. 05/01/23  Yes [name], NP  oxyCODONE -acetaminophen  (PERCOCET) 7.5-325 MG tablet Take 1-2 tablets by mouth every 6 (six) hours as needed for severe pain (pain score 7-10). 12/21/23  Yes [name], Athena N, NP  pantoprazole  (PROTONIX ) 40 MG tablet Take 1 tablet (40 mg total) by mouth 2 (two) times daily. 12/14/23  Yes [name], [name], MD  polyethylene glycol powder (GLYCOLAX /MIRALAX ) 17 GM/SCOOP powder Mix 17 g in 4 oz of liquid and take by mouth 2 (two) times daily as needed. Patient taking differently: Take 17 g by mouth 2 (two) times daily as needed (for constipation). 11/30/23  Yes [name], NP  prochlorperazine  (COMPAZINE ) 10 MG tablet Take 1 tablet (10 mg total) by mouth every 6 (six) hours as needed for nausea or vomiting. 12/07/23  Yes [name], Ripudeep K, MD  scopolamine  (TRANSDERM-SCOP) 1 MG/3DAYS Place 1 patch (1.5 mg total) onto the skin every 3 (three) days as needed (Dizziness, nausea or vomiting). 12/07/23  Yes [name], Ripudeep K, MD  sennosides-docusate sodium  (SENOKOT-S) 8.6-50 MG tablet Take 1 tablet by mouth daily. 11/30/23  Yes [name], NP  abemaciclib  (VERZENIO ) 50 MG tablet Take 50 mg by mouth 2 (two) times daily. Patient not taking: Reported on 12/22/2023    [provider]  propranolol  (INDERAL ) 10 MG tablet Take 1 tablet (10 mg total) by mouth 2 (two) times daily. Please call [phone] to schedule an appointment for future refills. Thank you. 3rd attempt. Patient not taking: Reported on 12/13/2023 06/01/23   [name], [name], DO  senna (SENOKOT) 8.6 MG TABS tablet Take 2 tablets (17.2 mg total) by mouth at bedtime. Patient not taking: Reported on 12/22/2023 06/30/22   Davia [name], MD    Current Facility-Administered Medications  Medication Dose Route Frequency Provider Last Rate Last Admin   0.9 %  sodium chloride  infusion   Intravenous Continuous Briana Elgin LABOR, MD        acetaminophen  (TYLENOL ) tablet 650 mg  650 mg Oral Q6H PRN Doutova, Anastassia, MD       Or   acetaminophen  (TYLENOL ) suppository 650 mg  650 mg Rectal Q6H PRN Doutova, Anastassia, MD       albuterol  (PROVENTIL ) (2.5 MG/3ML) 0.083% nebulizer solution 2.5 mg  2.5 mg Nebulization Q6H PRN Doutova, Anastassia, MD       bisacodyl  (DULCOLAX) suppository 10 mg  10 mg Rectal Once Santiel Topper, Gordy HERO, MD       calcium  gluconate 4 g in sodium chloride  0.9 % 100 mL IVPB  4 g Intravenous Once Briana Elgin LABOR, MD       fentaNYL  (SUBLIMAZE ) injection 12.5-50 mcg  12.5-50 mcg Intravenous Q2H  PRN Doutova, Anastassia, MD   50 mcg at 12/23/23 0502   fluconazole  (DIFLUCAN ) IVPB 200 mg  200 mg Intravenous Q24H Doutova, Anastassia, MD   Stopped at 12/23/23 0017   gabapentin  (NEURONTIN ) capsule 300 mg  300 mg Oral TID Doutova, Anastassia, MD       metoCLOPramide  (REGLAN ) injection 5 mg  5 mg Intravenous Q6H PRN Doutova, Anastassia, MD   5 mg at 12/22/23 2154   milk and molasses enema  1 enema Rectal Once PRN Doutova, Anastassia, MD       montelukast  (SINGULAIR ) tablet 10 mg  10 mg Oral QHS Doutova, Anastassia, MD       ondansetron  (ZOFRAN ) injection 4 mg  4 mg Intravenous Q6H PRN Doutova, Anastassia, MD   4 mg at 12/23/23 0231   pantoprazole  (PROTONIX ) injection 40 mg  40 mg Intravenous Q12H Doutova, Anastassia, MD   40 mg at 12/22/23 2154   polyethylene glycol (MIRALAX  / GLYCOLAX ) packet 17 g  17 g Oral BID PRN Doutova, Anastassia, MD       polyethylene glycol (MIRALAX  / GLYCOLAX ) packet 17 g  17 g Oral Daily Doutova, Anastassia, MD   17 g at 12/22/23 2200   thiamine  (VITAMIN B1) injection 100 mg  100 mg Intravenous Daily Doutova, Anastassia, MD   100 mg at 12/22/23 2154    Allergies as of 12/22/2023   (No Known Allergies)    Family History  Problem Relation Age of Onset   Breast cancer Mother        92s   Hypertension Father    Uterine cancer Maternal Aunt    Ovarian cancer Maternal Aunt    Breast cancer  Maternal Aunt        28s   Breast cancer Cousin 36   Uterine cancer Cousin 15   Colon polyps Neg Hx    Colon cancer Neg Hx    Esophageal cancer Neg Hx    Stomach cancer Neg Hx     Social History   Tobacco Use   Smoking status: Never   Smokeless tobacco: Never  Vaping Use   Vaping status: Never Used  Substance Use Topics   Alcohol use: Not Currently    Alcohol/week: 7.0 standard drinks of alcohol    Types: 7 Standard drinks or equivalent per week    Comment: occassional   Drug use: No    Review of Systems: All systems reviewed and negative except where noted in HPI.  Physical Exam: Vital signs in last 24 hours: Temp:  [97.6 F (36.4 C)-98.4 F (36.9 C)] 98.2 F (36.8 C) (07/04 0525) Pulse Rate:  [75-124] 85 (07/04 0525) Resp:  [12-20] 18 (07/04 0525) BP: (65-111)/(29-79) 95/64 (07/04 0525) SpO2:  [97 %-100 %] 98 % (07/04 0525) Weight:  [76.7 kg] 76.7 kg (07/03 2136)   Gen: awake, alert, NAD HEENT: anicteric  CV: RRR, no mrg Pulm: CTA b/l Abd: soft, tender in the upper abdomen without rebound or guarding, bowel sounds are present but hypoactive this morning  Ext: no c/c/e Neuro: nonfocal    Intake/Output from previous day: 07/03 0701 - 07/04 0700 In: 360.4 [I.V.:41.7; IV Piggyback:318.7] Out: -  Intake/Output this shift: No intake/output data recorded.  Lab Results: Recent Labs    12/22/23 0957 12/22/23 1553 12/22/23 1608 12/23/23 0146  WBC 6.2 7.1  --  6.3  HGB 12.7 12.1 13.3 9.9*  HCT 38.8 38.8 39.0 32.0*  PLT 352 344  --  253   BMET Recent Labs  12/22/23 1553 12/22/23 1608 12/22/23 2233 12/23/23 0146  NA 136 135 135 135  K 2.9* 3.1* 3.0* 2.6*  CL 91* 94* 99 97*  CO2 26  --  21* 23  GLUCOSE 143* 141* 115* 109*  BUN 53* 55* 44* 44*  CREATININE 3.23* 3.20* 2.20* 2.13*  CALCIUM  7.0*  --  6.5* 7.1*   LFT Recent Labs    12/23/23 0146  PROT 7.1  ALBUMIN 3.4*  AST 106*  ALT 240*  ALKPHOS 320*  BILITOT 0.9   PT/INR Recent Labs     12/22/23 2233  LABPROT 15.5*  INR 1.2    .    Latest Ref Rng & Units 12/23/2023    1:46 AM 12/22/2023    4:08 PM 12/22/2023    3:53 PM  CBC  WBC 4.0 - 10.5 K/uL 6.3   7.1   Hemoglobin 12.0 - 15.0 g/dL 9.9  86.6  87.8   Hematocrit 36.0 - 46.0 % 32.0  39.0  38.8   Platelets 150 - 400 K/uL 253   344     .    Latest Ref Rng & Units 12/23/2023    1:46 AM 12/22/2023   10:33 PM 12/22/2023    4:08 PM  CMP  Glucose 70 - 99 mg/dL 890  884  858   BUN 6 - 20 mg/dL 44  44  55   Creatinine 0.44 - 1.00 mg/dL 7.86  7.79  6.79   Sodium 135 - 145 mmol/L 135  135  135   Potassium 3.5 - 5.1 mmol/L 2.6  3.0  3.1   Chloride 98 - 111 mmol/L 97  99  94   CO2 22 - 32 mmol/L 23  21    Calcium  8.9 - 10.3 mg/dL 7.1  6.5    Total Protein 6.5 - 8.1 g/dL 7.1     Total Bilirubin 0.0 - 1.2 mg/dL 0.9     Alkaline Phos 38 - 126 U/L 320     AST 15 - 41 U/L 106     ALT 0 - 44 U/L 240      Studies/Results: US  Abdomen Limited RUQ (LIVER/GB) Result Date: 12/23/2023 CLINICAL DATA:  Elevated LFTs EXAM: ULTRASOUND ABDOMEN LIMITED RIGHT UPPER QUADRANT COMPARISON:  None Available. FINDINGS: Gallbladder: Patient experienced some pain during the exam. The sonographer did not no whether this constituted a sonographic Murphy's sign. The sonographer is unable to be reached at this time. Borderline wall thickening (3 mm). No cholelithiasis. No pericholecystic fluid. Common bile duct: Diameter: 5 mm Liver: No focal lesion identified. Within normal limits in parenchymal echogenicity. Portal vein is patent on color Doppler imaging with normal direction of blood flow towards the liver. Other: None. IMPRESSION: 1. Borderline gallbladder wall thickening. No cholelithiasis or pericholecystic fluid. Patient had pain during the exam but it is unclear if this constitutes a sonographic Murphy's sign. If there is concern for acute cholecystitis consider HIDA scan for further evaluation. 2. No biliary dilatation. Electronically Signed   By: Norman Gatlin M.D.   On: 12/23/2023 00:32   CT CHEST ABDOMEN PELVIS WO CONTRAST Result Date: 12/22/2023 CLINICAL DATA:  Bowel obstruction suspected nausea, vomiting, abdominal pain, and per cancer center hypotension. Pt has not been able to keep food or drink down. Stated she did an enema on Monday and only had a small amount of output. Also stated she has lost almost 20lbs EXAM: CT CHEST, ABDOMEN AND PELVIS WITHOUT CONTRAST TECHNIQUE: Multidetector CT imaging of the chest, abdomen  and pelvis was performed following the standard protocol without IV contrast. RADIATION DOSE REDUCTION: This exam was performed according to the departmental dose-optimization program which includes automated exposure control, adjustment of the mA and/or kV according to patient size and/or use of iterative reconstruction technique. COMPARISON:  CT chest 12/12/2023, CT abdomen pelvis 12/10/2023, PET CT 11/23/2023, CT abdomen pelvis 11/17/2022 FINDINGS: CT CHEST FINDINGS Cardiovascular: Normal heart size. No significant pericardial effusion. The thoracic aorta is normal in caliber. No atherosclerotic plaque of the thoracic aorta. No coronary artery calcifications. Mediastinum/Nodes: No gross hilar adenopathy, noting limited sensitivity for the detection of hilar adenopathy on this noncontrast study. No enlarged mediastinal or axillary lymph nodes. Thyroid  gland, trachea, and esophagus demonstrate no significant findings. Lungs/Pleura: Similar-appearing bilateral upper lung zone, right greater than left, centrilobular pulmonary micronodules. No pulmonary mass. No pleural effusion. No pneumothorax. Musculoskeletal: Postsurgical changes of the bilateral breasts. No suspicious lytic or blastic osseous lesions. No acute displaced fracture. Multilevel degenerative changes of the spine. CT ABDOMEN PELVIS FINDINGS Hepatobiliary: No focal liver abnormality. No gallstones, gallbladder wall thickening, or pericholecystic fluid. No biliary dilatation.  Pancreas: Persistent vague fat stranding along the proximal pancreas with query underlying vague mass (2:68). No surrounding inflammatory changes. No main pancreatic ductal dilatation. Spleen: Normal in size without focal abnormality. Adrenals/Urinary Tract: No adrenal nodule bilaterally. No nephrolithiasis and no hydronephrosis. No definite contour-deforming renal mass. No ureterolithiasis or hydroureter. The urinary bladder is unremarkable. Stomach/Bowel: Stomach is within normal limits. No evidence of bowel wall thickening or dilatation. Appendix appears normal. Vascular/Lymphatic: No abdominal aorta or iliac aneurysm. No abdominal, pelvic, or inguinal lymphadenopathy. Reproductive: Status post hysterectomy. No adnexal masses. Other: No intraperitoneal free fluid. No intraperitoneal free gas. No organized fluid collection. Musculoskeletal: No abdominal wall hernia or abnormality. Persistent scattered axial and appendicular sclerotic osseous metastatic lesions. L3 vertebral body hemangioma with stable chronic vertebral body height loss. No acute displaced fracture. Multilevel degenerative changes of the spine. IMPRESSION: 1. Limited evaluation on this noncontrast study. 2. Similar-appearing bilateral upper lung zone, right greater than left, centrilobular pulmonary micronodules. Finding may represent infection/inflammation. Underlying malignancy not excluded. 3. Persistent vague fat stranding along the proximal pancreas with query underlying vague mass. Correlate with lipase levels. If clinically indicated, consider MRI pancreatic protocol for further evaluation. 4. Persistent scattered axial and appendicular sclerotic osseous metastatic lesions. 5. Colonic diverticulosis with no acute diverticulitis. Electronically Signed   By: Morgane  Naveau M.D.   On: 12/22/2023 17:55    Principal Problem:   AKI (acute kidney injury) (HCC) Active Problems:   Hypokalemia   HLD (hyperlipidemia)   Breast cancer  metastasized to bone, right (HCC)   Constipation   Acute kidney injury superimposed on chronic kidney disease (HCC)   Dehydration   Prolonged QT interval   Hypocalcemia   Acute esophagitis   Candida esophagitis (HCC)   Transaminitis   Pancreatic mass   Right sided weakness   Hypotension    Pj Zehner M. Fanny Agan, M.D. @  12/23/2023, 9:58 AM

## 2023-12-23 NOTE — Evaluation (Signed)
 Physical Therapy One Time Evaluation Patient Details Name: Chelsea Hansen MRN: [id number] DOB: 02-13-1971 Today's Date: 12/23/2023  History of Present Illness  Pt is a 53 y.o. female presenting with nausea and vomiting and admitted for acute kidney injury on 12/22/23.  Past medical history significant of breast cancer, Duodenal stenosis, esophagitis Constipation, HLD, HTN , anemia, DVT, asthma, SVT  Clinical Impression  Patient evaluated by Physical Therapy with no further acute PT needs identified. All education has been completed and the patient has no further questions. Pt reports she has been mobilizing around hospital room and doing okay.  Pt states she lives with her daughter and her children rotate assisting her as needed.  Pt interested in f/u PT upon d/c to improve overall strength and fatigue.  Discussed OPPT being most beneficial and then HHPT if pt not able to transport to clinic. Pt reports receiving RW from previous admission.  Pt encouraged to continue mobilizing (initially with staff for safety) during remainder of admission.  PT is signing off. Thank you for this referral.      If plan is discharge home, recommend the following: Help with stairs or ramp for entrance;Assistance with cooking/housework;Assist for transportation   Can travel by private vehicle        Equipment Recommendations None recommended by PT  Recommendations for Other Services       Functional Status Assessment Patient has had a recent decline in their functional status and demonstrates the ability to make significant improvements in function in a reasonable and predictable amount of time.     Precautions / Restrictions Precautions Precautions: Fall Recall of Precautions/Restrictions: Intact      Mobility  Bed Mobility Overal bed mobility: Modified Independent             General bed mobility comments: HOB elevated    Transfers Overall transfer level: Needs assistance Equipment used:  None Transfers: Sit to/from Stand Sit to Stand: Supervision, Modified independent (Device/Increase time)                Ambulation/Gait Ambulation/Gait assistance: Supervision, Modified independent (Device/Increase time) Gait Distance (Feet): 160 Feet Assistive device: IV Pole Gait Pattern/deviations: Decreased stride length, Step-through pattern Gait velocity: decreased     General Gait Details: pt pushed IV pole for more support (declined using RW although she has one at home), no overt LOB or unsteadiness observed  Stairs            Wheelchair Mobility     Tilt Bed    Modified Rankin (Stroke Patients Only)       Balance                                             Pertinent Vitals/Pain Pain Assessment Pain Assessment: No/denies pain Pain Intervention(s): Monitored during session, Repositioned    Home Living Family/patient expects to be discharged to:: Private residence Living Arrangements: Children (daughter) Available Help at Discharge: Family;Available 24 hours/day Type of Home: House Home Access: Stairs to enter Entrance Stairs-Rails: Can reach both Entrance Stairs-Number of Steps: 6-8   Home Layout: Two level;Able to live on main level with bedroom/bathroom;1/2 bath on main level Home Equipment: Rolling Walker (2 wheels);Shower seat      Prior Function Prior Level of Function : Independent/Modified Independent;Working/employed;Driving             Mobility Comments: typically independent,  received RW and shower chair from recent admission per pt       Extremity/Trunk Assessment        Lower Extremity Assessment Lower Extremity Assessment: Overall WFL for tasks assessed (per notes, pt reporting right sided weakness for past month; pt denied much difference today and functionally presents equally)       Communication   Communication Communication: No apparent difficulties    Cognition Arousal: Alert Behavior  During Therapy: Flat affect   PT - Cognitive impairments: No apparent impairments                         Following commands: Intact       Cueing       General Comments      Exercises     Assessment/Plan    PT Assessment All further PT needs can be met in the next venue of care  PT Problem List Decreased strength;Decreased activity tolerance;Decreased mobility;Decreased knowledge of use of DME       PT Treatment Interventions      PT Goals (Current goals can be found in the Care Plan section)  Acute Rehab PT Goals PT Goal Formulation: All assessment and education complete, DC therapy    Frequency       Co-evaluation               AM-PAC PT 6 Clicks Mobility  Outcome Measure Help needed turning from your back to your side while in a flat bed without using bedrails?: None Help needed moving from lying on your back to sitting on the side of a flat bed without using bedrails?: None Help needed moving to and from a bed to a chair (including a wheelchair)?: None Help needed standing up from a chair using your arms (e.g., wheelchair or bedside chair)?: A Little Help needed to walk in hospital room?: A Little Help needed climbing 3-5 steps with a railing? : A Little 6 Click Score: 21    End of Session Equipment Utilized During Treatment: Gait belt Activity Tolerance: Patient tolerated treatment well Patient left: with call bell/phone within reach;in bed Nurse Communication: Mobility status PT Visit Diagnosis: Muscle weakness (generalized) (M62.81);Difficulty in walking, not elsewhere classified (R26.2)    Time: 8995-8985 PT Time Calculation (min) (ACUTE ONLY): 10 min   Charges:   PT Evaluation $PT Eval Low Complexity: 1 Low   PT General Charges $$ ACUTE PT VISIT: 1 Visit       Tari PT, DPT Physical Therapist Acute Rehabilitation Services Office: [phone]   Tari CROME Payson 12/23/2023, 12:38 PM

## 2023-12-23 NOTE — Hospital Course (Addendum)
 53 y.o. 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   Imaging showed a duodenal obstruction and masslike area of the pancreas.  GI and General Surgery consulted.     EUS attempted on 7/7, but unsuccessful secondary to duodenal stenosis.     Repeat EGD/EUS on 7/9 again unsuccessful in biopsy.  Gen surg consulted and NG tube was placed, patient started on TPN, patient subsequently underwent laparoscopic creation of gastrojejunostomy for duodenal stricture.  Patient started on clear liquids and diet advanced to full liquid diet. -Patient with ongoing nausea vomiting and followed by general surgery. -Patient to undergo gastrojejunostomy tube tube insertion for drainage of the stomach and J-tube downstream for tube feeds today, 01/10/2024.

## 2023-12-23 NOTE — Evaluation (Signed)
 Occupational Therapy Evaluation Patient Details Name: Chelsea Hansen MRN: [id number] DOB: 1970/09/09 Today's Date: 12/23/2023   History of Present Illness   Pt is a 53 yr old female presenting with nausea and vomiting and admitted for acute kidney injury and dehydration on 12/22/23.  Past medical history significant of breast cancer, duodenal stenosis, esophagitis, constipation, HLD, HTN , anemia, DVT, asthma, SVT     Clinical Impressions The pt is currently presenting with the below listed deficits (see OT problem list)., During the session, she required supervision for most assessed tasks, including lower body dressing and sit to stand. She reported being independent with all ADLs, working, and driving, prior to ~1 month ago. Since then, she has good and bad days. On bad days, she requires CGA from family for ambulating in her home, as well as intermittent assistance from her children for lower body dressing and bathing. She further reported current feelings of generalized weakness, significant back pain, and extreme ongoing nausea and vomiting. She will benefit from further OT services in the acute care setting to maximize her independence with ADLs and to decrease the risk for restricted participation in meaningful activities. OT anticipates she will be okay to return home at discharge with family assistance as needed, though if she feels additional supports are needed then consider home health OT.      If plan is discharge home, recommend the following:   Assist for transportation;Assistance with cooking/housework;Help with stairs or ramp for entrance     Functional Status Assessment   Patient has had a recent decline in their functional status and demonstrates the ability to make significant improvements in function in a reasonable and predictable amount of time.     Equipment Recommendations   None recommended by OT     Recommendations for Other Services          Precautions/Restrictions   Restrictions Weight Bearing Restrictions Per Provider Order: No     Mobility Bed Mobility Overal bed mobility: Modified Independent Bed Mobility: Supine to Sit                Transfers Overall transfer level: Needs assistance Equipment used: None Transfers: Sit to/from Stand Sit to Stand: Supervision                  Balance     Sitting balance-Leahy Scale: Good       Standing balance-Leahy Scale:  (Supervision)                             ADL either performed or assessed with clinical judgement   ADL Overall ADL's : Needs assistance/impaired Eating/Feeding: Independent;Sitting   Grooming: Supervision/safety;Standing           Upper Body Dressing : Set up;Sitting   Lower Body Dressing: Set up;Sit to/from stand;Sitting/lateral leans   Toilet Transfer: Academic librarian;Ambulation   Toileting- Clothing Manipulation and Hygiene: Supervision/safety;Set up;Sit to/from stand               Vision   Additional Comments: She correctlyl read the time depicted on the wall clock.     Perception         Praxis         Pertinent Vitals/Pain Pain Assessment Pain Assessment: 0-10 Pain Score: 9  Pain Location: stomach Pain Intervention(s): Limited activity within patient's tolerance, Monitored during session, Repositioned     Extremity/Trunk Assessment Upper Extremity Assessment Upper Extremity Assessment: Overall  WFL for tasks assessed;Right hand dominant   Lower Extremity Assessment Lower Extremity Assessment: Overall WFL for tasks assessed       Communication Communication Communication: No apparent difficulties   Cognition Arousal: Alert   Cognition: No apparent impairments             OT - Cognition Comments: Oriented x4                 Following commands: Intact                  Home Living Family/patient expects to be discharged to:: Private  residence Living Arrangements: Children (daughter) Available Help at Discharge: Family Type of Home: Apartment Home Access: Stairs to enter Secretary/administrator of Steps: 7         Bathroom Shower/Tub: Tub/shower unit         Home Equipment: BSC/3in1;Tub bench;Rolling Environmental consultant (2 wheels)          Prior Functioning/Environment Prior Level of Function : Independent/Modified Independent;Working/employed;Driving             Mobility Comments:  (Prior to 1 month ago when she started getting sick, she was independent with ambulation. She reports needing occasional CGA for household ambulation, when she is having a bad day.) ADLs Comments:  (Prior to ~1 month ago when she started getting sick, she was independent with ADLs, driving, and working as a Production designer, theatre/television/film at [employer]. Her kids have been assisting her with self-care and household chores as needed since then.)    OT Problem List: Decreased activity tolerance;Decreased strength;Impaired balance (sitting and/or standing);Pain   OT Treatment/Interventions: Self-care/ADL training;DME and/or AE instruction;Therapeutic activities;Patient/family education;Balance training;Energy conservation      OT Goals(Current goals can be found in the care plan section)   Acute Rehab OT Goals Patient Stated Goal: decreased pain and to return to work OT Goal Formulation: With patient Time For Goal Achievement: 01/06/24 Potential to Achieve Goals: Good ADL Goals Pt Will Perform Grooming: with modified independence;standing Pt Will Perform Lower Body Dressing: with modified independence;sitting/lateral leans;sit to/from stand Pt Will Transfer to Toilet: with modified independence;ambulating Pt Will Perform Toileting - Clothing Manipulation and hygiene: with modified independence;sit to/from stand   OT Frequency:  Min 2X/week       AM-PAC OT 6 Clicks Daily Activity     Outcome Measure Help from another person eating meals?: None Help  from another person taking care of personal grooming?: None Help from another person toileting, which includes using toliet, bedpan, or urinal?: A Little Help from another person bathing (including washing, rinsing, drying)?: A Little Help from another person to put on and taking off regular upper body clothing?: None Help from another person to put on and taking off regular lower body clothing?: A Little 6 Click Score: 21   End of Session Equipment Utilized During Treatment: Other (comment) (N/A) Nurse Communication: Other (comment) (pt inquired about whether or not she was getting a MRI today)  Activity Tolerance: Patient limited by pain Patient left: in bed;with call bell/phone within reach  OT Visit Diagnosis: Muscle weakness (generalized) (M62.81);Pain Pain - part of body:  (abdomen)                Time: 1255-1315 OT Time Calculation (min): 20 min Charges:  OT General Charges $OT Visit: 1 Visit OT Evaluation $OT Eval Moderate Complexity: 1 Mod   Chelsea Hansen, OTR/L 12/23/2023, 3:20 PM

## 2023-12-24 ENCOUNTER — Inpatient Hospital Stay (HOSPITAL_COMMUNITY): Payer: MEDICAID

## 2023-12-24 DIAGNOSIS — N179 Acute kidney failure, unspecified: Secondary | ICD-10-CM | POA: Diagnosis not present

## 2023-12-24 DIAGNOSIS — K8689 Other specified diseases of pancreas: Secondary | ICD-10-CM | POA: Diagnosis not present

## 2023-12-24 DIAGNOSIS — R1013 Epigastric pain: Secondary | ICD-10-CM

## 2023-12-24 DIAGNOSIS — R112 Nausea with vomiting, unspecified: Secondary | ICD-10-CM | POA: Diagnosis not present

## 2023-12-24 DIAGNOSIS — R748 Abnormal levels of other serum enzymes: Secondary | ICD-10-CM | POA: Diagnosis not present

## 2023-12-24 LAB — COMPREHENSIVE METABOLIC PANEL WITH GFR
ALT: 157 U/L — ABNORMAL HIGH (ref 0–44)
AST: 83 U/L — ABNORMAL HIGH (ref 15–41)
Albumin: 2.9 g/dL — ABNORMAL LOW (ref 3.5–5.0)
Alkaline Phosphatase: 292 U/L — ABNORMAL HIGH (ref 38–126)
Anion gap: 10 (ref 5–15)
BUN: 19 mg/dL (ref 6–20)
CO2: 22 mmol/L (ref 22–32)
Calcium: 7.5 mg/dL — ABNORMAL LOW (ref 8.9–10.3)
Chloride: 106 mmol/L (ref 98–111)
Creatinine, Ser: 0.88 mg/dL (ref 0.44–1.00)
GFR, Estimated: >60 mL/min (ref >60)
Glucose, Bld: 101 mg/dL — ABNORMAL HIGH (ref 70–99)
Potassium: 3.1 mmol/L — ABNORMAL LOW (ref 3.5–5.1)
Sodium: 138 mmol/L (ref 135–145)
Total Bilirubin: 0.6 mg/dL (ref 0.0–1.2)
Total Protein: 6.3 g/dL — ABNORMAL LOW (ref 6.5–8.1)

## 2023-12-24 LAB — CBC
HCT: 27.7 % — ABNORMAL LOW (ref 36.0–46.0)
Hemoglobin: 8.5 g/dL — ABNORMAL LOW (ref 12.0–15.0)
MCH: 22.7 pg — ABNORMAL LOW (ref 26.0–34.0)
MCHC: 30.7 g/dL (ref 30.0–36.0)
MCV: 74.1 fL — ABNORMAL LOW (ref 80.0–100.0)
Platelets: 200 K/uL (ref 150–400)
RBC: 3.74 MIL/uL — ABNORMAL LOW (ref 3.87–5.11)
RDW: 14.6 % (ref 11.5–15.5)
WBC: 4.6 K/uL (ref 4.0–10.5)
nRBC: 0 % (ref 0.0–0.2)

## 2023-12-24 LAB — MAGNESIUM: Magnesium: 1.9 mg/dL (ref 1.7–2.4)

## 2023-12-24 LAB — PHOSPHORUS: Phosphorus: 2.2 mg/dL — ABNORMAL LOW (ref 2.5–4.6)

## 2023-12-24 MED ORDER — POTASSIUM CHLORIDE 10 MEQ/100ML IV SOLN
10.0000 meq | INTRAVENOUS | Status: AC
  Administered 2023-12-24 (×4): 10 meq via INTRAVENOUS
  Filled 2023-12-24 (×4): qty 100

## 2023-12-24 MED ORDER — SODIUM CHLORIDE 0.9 % IV SOLN
4.0000 g | Freq: Once | INTRAVENOUS | Status: AC
  Administered 2023-12-24: 4 g via INTRAVENOUS
  Filled 2023-12-24: qty 40

## 2023-12-24 NOTE — Progress Notes (Signed)
 Progress Note   Assessment    53 y.o. 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 intake and upper abdominal pain with weight loss with MRI showing a masslike appearance of the pancreatic head  Principal Problem:   AKI (acute kidney injury) (HCC) Active Problems:   Hypokalemia   HLD (hyperlipidemia)   Breast cancer metastasized to bone, right (HCC)   Constipation   Acute kidney injury superimposed on chronic kidney disease (HCC)   Dehydration   Prolonged QT interval   Hypocalcemia   Acute esophagitis   Candida esophagitis (HCC)   Transaminitis   Pancreatic mass   Right sided weakness   Hypotension   Abnormal CT of the abdomen   Elevated liver enzymes   Acute kidney injury (HCC)   Recommendations   1.  Concern for pancreatic head mass/upper abdominal pain with nausea vomiting and elevated liver enzymes --MRI was able to better characterize the pancreatic head abnormality seen by CT.  There appears to be a 2.5 x 2.3 cm mass in the pancreatic head which is new compared to December 2024 PET/CT.  Bile duct is upper limit of normal in size as is the pancreatic duct.  I did discuss my concern regarding the pancreatic lesion with her directly today.  While this could be inflammatory only, we need to exclude malignancy both primary pancreatic legacy versus metastatic breast cancer. -- EUS recommended next week with Dr. Wilhelmenia; hopefully this can be performed Monday -- Okay to advance diet as tolerated -- N.p.o. after midnight on Sunday going into Monday -- Follow liver enzymes daily,  pain control and antiemetics as needed  2.  Recent diagnosis of esophagitis and esophageal candidiasis  -- Continue fluconazole  and PPI  3.  Duodenal stricture --query if this is actually related to #1; she does not appear to have a gastric outlet obstruction at this time  4.  Constipation --MiraLAX , as needed Dulcolax suppository  5.  Hypokalemia --improving but still slightly low -- Continue potassium replacement per primary team  6.  Acute on chronic renal insufficiency --continue IV hydration per primary team    Chief Complaint   Patient feeling better today, no vomiting Upper abdominal pain present but better Nonmelenic stool this morning  Vital signs in last 24 hours: Temp:  [98 F (36.7 C)-98.9 F (37.2 C)] 98.9 F (37.2 C) (07/05 0452) Pulse Rate:  [63-93] 93 (07/05 0452) Resp:  [16-18] 17 (07/05 0452) BP: (99-118)/(64-81) 99/64 (07/05 0452) SpO2:  [98 %-100 %] 98 % (07/05 0452) Weight:  [76.5 kg] 76.5 kg (07/05 0500)   Gen: awake, alert, NAD HEENT: anicteric  CV: RRR, no mrg Pulm: CTA b/l Abd: soft, tender in the upper abdomen without rebound or guarding, nondistended  +BS throughout Ext: no c/c/e Neuro: nonfocal  Intake/Output from previous day: 07/04 0701 - 07/05 0700 In: 662.7 [I.V.:562.7; IV Piggyback:100] Out: 2900 [Urine:2400; Emesis/NG output:500] Intake/Output this shift: No intake/output data recorded.  Lab Results: Recent Labs    12/22/23 1553 12/22/23 1608 12/23/23 0146 12/24/23 0329  WBC 7.1  --  6.3 4.6  HGB 12.1 13.3 9.9* 8.5*  HCT 38.8 39.0 32.0* 27.7*  PLT 344  --  253 200   BMET Recent Labs    12/22/23 2233 12/23/23 0146 12/23/23 0936 12/24/23 0329  NA 135 135  --  138  K 3.0* 2.6* 3.5 3.1*  CL 99 97*  --  106  CO2 21* 23  --  22  GLUCOSE 115* 109*  --  101*  BUN 44* 44*  --  19  CREATININE 2.20* 2.13*  --  0.88  CALCIUM  6.5* 7.1*  --  7.5*   LFT Recent Labs    12/24/23 0329  PROT 6.3*  ALBUMIN 2.9*  AST 83*   ALT 157*  ALKPHOS 292*  BILITOT 0.6   PT/INR Recent Labs    12/22/23 2233  LABPROT 15.5*  INR 1.2   Hepatitis Panel Recent Labs    12/23/23 0146  HEPBSAG NON REACTIVE  HCVAB NON REACTIVE  HEPAIGM NON REACTIVE  HEPBIGM NON REACTIVE    Studies/Results: MR ABDOMEN MRCP W WO CONTAST Result Date: 12/23/2023 CLINICAL DATA:  Pancreatitis suspected, elevated LFTs, query pancreatic mass, history of metastatic breast cancer EXAM: MRI ABDOMEN WITHOUT AND WITH CONTRAST (INCLUDING MRCP) TECHNIQUE: Multiplanar multisequence MR imaging of the abdomen was performed both before and after the administration of intravenous contrast. Heavily T2-weighted images of the biliary and pancreatic ducts were obtained, and three-dimensional MRCP images were rendered by post processing. CONTRAST:  7mL GADAVIST  GADOBUTROL  1 MMOL/ML IV SOLN COMPARISON:  CT chest abdomen pelvis, 12/22/2023, PET-CT, 11/24/2023, 06/10/2023 FINDINGS: Lower chest: No acute abnormality. Hepatobiliary: No solid liver abnormality is seen. No gallstones or gallbladder wall thickening. Common bile duct at the upper limit of normal in caliber measuring up to 0.7 cm (series 3, image 16). Pancreas: Subtly expansile, masslike appearance of the pancreatic head and uncinate as seen by prior CT, measuring 2.5 x 2.3 cm (series 4, image 20). There is however minimal if any associated hypoenhancement and no underlying diffusion restriction. The central pancreatic duct and central common bile duct are effaced at this level just above the ampulla. Notably, this appearance has significantly changed in the interval to prior restaging PET-CT examination dated 06/10/2023. Prominence of the pancreatic duct measuring up to 0.4 cm. Spleen: Normal in size without significant abnormality. Adrenals/Urinary Tract: Adrenal glands are unremarkable. Multiple simple renal cortical cysts, benign, requiring no further follow-up or characterization kidneys are otherwise normal,  without obvious renal calculi, solid lesion, or hydronephrosis. Stomach/Bowel: Stomach is within normal limits. No evidence of bowel wall thickening, distention, or inflammatory changes. Pancolonic diverticulosis. Vascular/Lymphatic: No significant vascular findings are present. No enlarged abdominal lymph nodes. Other: No abdominal wall hernia or abnormality. No ascites. Musculoskeletal: No acute osseous findings. Heterogeneously enhancing vertebral metastases (series 20, image 20). IMPRESSION: 1. Subtly expansile, masslike appearance of the pancreatic head and uncinate as seen by prior CT, measuring 2.5 x 2.3 cm. 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 2. Common bile duct at the upper limit of normal in caliber measuring up to 0.7 cm. No gallstones. 3. Prominence of the pancreatic duct measuring up to 0.4 cm. 4. Heterogeneously enhancing vertebral metastases, in keeping with known metastatic breast cancer. 5. Pancolonic diverticulosis. Electronically Signed   By: Marolyn JONETTA Jaksch M.D.   On: 12/23/2023 20:20   MR 3D Recon At Scanner Result Date: 12/23/2023 CLINICAL DATA:  Pancreatitis suspected, elevated LFTs, query pancreatic mass, history of metastatic breast cancer EXAM: MRI ABDOMEN WITHOUT AND WITH CONTRAST (INCLUDING MRCP) TECHNIQUE: Multiplanar multisequence MR imaging of the  abdomen was performed both before and after the administration of intravenous contrast. Heavily T2-weighted images of the biliary and pancreatic ducts were obtained, and three-dimensional MRCP images were rendered by post processing. CONTRAST:  7mL GADAVIST  GADOBUTROL  1 MMOL/ML IV SOLN COMPARISON:  CT chest abdomen pelvis, 12/22/2023, PET-CT, 11/24/2023, 06/10/2023 FINDINGS: Lower chest: No acute abnormality. Hepatobiliary: No solid liver abnormality is seen. No gallstones or gallbladder wall thickening. Common bile duct at the upper limit  of normal in caliber measuring up to 0.7 cm (series 3, image 16). Pancreas: Subtly expansile, masslike appearance of the pancreatic head and uncinate as seen by prior CT, measuring 2.5 x 2.3 cm (series 4, image 20). There is however minimal if any associated hypoenhancement and no underlying diffusion restriction. The central pancreatic duct and central common bile duct are effaced at this level just above the ampulla. Notably, this appearance has significantly changed in the interval to prior restaging PET-CT examination dated 06/10/2023. Prominence of the pancreatic duct measuring up to 0.4 cm. Spleen: Normal in size without significant abnormality. Adrenals/Urinary Tract: Adrenal glands are unremarkable. Multiple simple renal cortical cysts, benign, requiring no further follow-up or characterization kidneys are otherwise normal, without obvious renal calculi, solid lesion, or hydronephrosis. Stomach/Bowel: Stomach is within normal limits. No evidence of bowel wall thickening, distention, or inflammatory changes. Pancolonic diverticulosis. Vascular/Lymphatic: No significant vascular findings are present. No enlarged abdominal lymph nodes. Other: No abdominal wall hernia or abnormality. No ascites. Musculoskeletal: No acute osseous findings. Heterogeneously enhancing vertebral metastases (series 20, image 20). IMPRESSION: 1. Subtly expansile, masslike appearance of the pancreatic head and uncinate as seen by prior CT, measuring 2.5 x 2.3 cm. 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 2. Common bile duct at the upper limit of normal in caliber measuring up to 0.7 cm. No gallstones. 3. Prominence of the pancreatic duct measuring up to 0.4 cm. 4. Heterogeneously enhancing vertebral metastases, in keeping with known metastatic breast cancer. 5. Pancolonic diverticulosis. Electronically Signed   By: Marolyn JONETTA Jaksch M.D.   On:  12/23/2023 20:20   US  Abdomen Limited RUQ (LIVER/GB) Result Date: 12/23/2023 CLINICAL DATA:  Elevated LFTs EXAM: ULTRASOUND ABDOMEN LIMITED RIGHT UPPER QUADRANT COMPARISON:  None Available. FINDINGS: Gallbladder: Patient experienced some pain during the exam. The sonographer did not no whether this constituted a sonographic Murphy's sign. The sonographer is unable to be reached at this time. Borderline wall thickening (3 mm). No cholelithiasis. No pericholecystic fluid. Common bile duct: Diameter: 5 mm Liver: No focal lesion identified. Within normal limits in parenchymal echogenicity. Portal vein is patent on color Doppler imaging with normal direction of blood flow towards the liver. Other: None. IMPRESSION: 1. Borderline gallbladder wall thickening. No cholelithiasis or pericholecystic fluid. Patient had pain during the exam but it is unclear if this constitutes a sonographic Murphy's sign. If there is concern for acute cholecystitis consider HIDA scan for further evaluation. 2. No biliary dilatation. Electronically Signed   By: Norman Gatlin M.D.   On: 12/23/2023 00:32   CT CHEST ABDOMEN PELVIS WO CONTRAST Result Date: 12/22/2023 CLINICAL DATA:  Bowel obstruction suspected nausea, vomiting, abdominal pain, and per cancer center hypotension. Pt has not been able to keep food or drink down. Stated she did an enema on Monday and only had a small amount of output. Also stated she has lost almost 20lbs EXAM: CT CHEST, ABDOMEN AND PELVIS WITHOUT CONTRAST TECHNIQUE: Multidetector CT imaging of the chest,  abdomen and pelvis was performed following the standard protocol without IV contrast. RADIATION DOSE REDUCTION: This exam was performed according to the departmental dose-optimization program which includes automated exposure control, adjustment of the mA and/or kV according to patient size and/or use of iterative reconstruction technique. COMPARISON:  CT chest 12/12/2023, CT abdomen pelvis 12/10/2023, PET CT  11/23/2023, CT abdomen pelvis 11/17/2022 FINDINGS: CT CHEST FINDINGS Cardiovascular: Normal heart size. No significant pericardial effusion. The thoracic aorta is normal in caliber. No atherosclerotic plaque of the thoracic aorta. No coronary artery calcifications. Mediastinum/Nodes: No gross hilar adenopathy, noting limited sensitivity for the detection of hilar adenopathy on this noncontrast study. No enlarged mediastinal or axillary lymph nodes. Thyroid  gland, trachea, and esophagus demonstrate no significant findings. Lungs/Pleura: Similar-appearing bilateral upper lung zone, right greater than left, centrilobular pulmonary micronodules. No pulmonary mass. No pleural effusion. No pneumothorax. Musculoskeletal: Postsurgical changes of the bilateral breasts. No suspicious lytic or blastic osseous lesions. No acute displaced fracture. Multilevel degenerative changes of the spine. CT ABDOMEN PELVIS FINDINGS Hepatobiliary: No focal liver abnormality. No gallstones, gallbladder wall thickening, or pericholecystic fluid. No biliary dilatation. Pancreas: Persistent vague fat stranding along the proximal pancreas with query underlying vague mass (2:68). No surrounding inflammatory changes. No main pancreatic ductal dilatation. Spleen: Normal in size without focal abnormality. Adrenals/Urinary Tract: No adrenal nodule bilaterally. No nephrolithiasis and no hydronephrosis. No definite contour-deforming renal mass. No ureterolithiasis or hydroureter. The urinary bladder is unremarkable. Stomach/Bowel: Stomach is within normal limits. No evidence of bowel wall thickening or dilatation. Appendix appears normal. Vascular/Lymphatic: No abdominal aorta or iliac aneurysm. No abdominal, pelvic, or inguinal lymphadenopathy. Reproductive: Status post hysterectomy. No adnexal masses. Other: No intraperitoneal free fluid. No intraperitoneal free gas. No organized fluid collection. Musculoskeletal: No abdominal wall hernia or  abnormality. Persistent scattered axial and appendicular sclerotic osseous metastatic lesions. L3 vertebral body hemangioma with stable chronic vertebral body height loss. No acute displaced fracture. Multilevel degenerative changes of the spine. IMPRESSION: 1. Limited evaluation on this noncontrast study. 2. Similar-appearing bilateral upper lung zone, right greater than left, centrilobular pulmonary micronodules. Finding may represent infection/inflammation. Underlying malignancy not excluded. 3. Persistent vague fat stranding along the proximal pancreas with query underlying vague mass. Correlate with lipase levels. If clinically indicated, consider MRI pancreatic protocol for further evaluation. 4. Persistent scattered axial and appendicular sclerotic osseous metastatic lesions. 5. Colonic diverticulosis with no acute diverticulitis. Electronically Signed   By: Morgane  Naveau M.D.   On: 12/22/2023 17:55      LOS: 1 day   Gordy CHRISTELLA Starch, MD 12/24/2023, 11:01 AM See [facility], [facility], to contact our on call provider

## 2023-12-24 NOTE — Progress Notes (Signed)
 PROGRESS NOTE    Chelsea Hansen  FMW:[id number] DOB: November 21, 1970 DOA: 12/22/2023 PCP: [name], NP   Brief Narrative: Chelsea Hansen is a 53 y.o. female with a history of metastatic breast cancer, duodenal stenosis, esophageal candidiasis, constipation, hyperlipidemia, hypertension, anemia, DVT, asthma.  Patient presented secondary to abdominal pain and recurrent emesis with concern for possible biliary process and with associated AKI.  Imaging concerning for possible pancreatic lesion in addition to possible gallbladder pathology.  Patient started on IV fluids.  Gastroenterology consulted for recommendations. MRCP ordered and with findings concerning for primary pancreatic adenocarcinoma. Plan for EUS.   Assessment and Plan:  AKI on CKD IIIa Secondary to intractable nausea and vomiting with resultant hypovolemia. Baseline creatinine of about 1. Creatinine of 3.25 on admission. Patient started on IV fluids. - Continue IV fluids - Daily BMP  Nausea and Vomiting Present on admission.  Unclear etiology but possibly related to biliary process. - Continue Zofran  and Reglan  as needed  Hypokalemia Secondary to GI losses from recurrent emesis. - Potassium supplementation as needed  Elevated AST/ALT Elevated Lipase Pancreatic mass CT chest/abdomen/pelvis and right upper quadrant ultrasound obtained on admission and significant for evidence of stranding of the proximal pancreas with possible underlying vague mass, borderline gallbladder wall thickening, with no evidence of cholelithiasis or pericholecystic fluid.  Gastroenterology was consulted on admission.  LFTs trending down. MRCP ordered and concerning for pancreatic adenocarcinoma. - Gastroenterology recommendations: plan for EUS on 7/7  Candida esophagitis Patient diagnosed on 6/24. Patient has been on fluconazole  as an outpatient, but with recurrent emesis, has not received consistent dosing. Patient started on fluconazole . -  Continue fluconazole  per GI recommendations  Hypocalcemia Low, even accounting for albumin. Calcium  gluconate given. Corrected calcium  of about 8.4. -Calcium  gluconate  Acute on chronic anemia Baseline hemoglobin appears to be around 9-10. Hemoglobin normal at 12.7 on admission. This may be possibly related to hemoconcentration as she has drifted down towards baseline.  Hemoglobin down to 8.5 now. No episodes of bleeding noted. - CBC in AM  Constipation - Continue MiraLAX   History of breast cancer Metastasis to bone Patient follows with Dr. [name] as an outpatient and is on anastrazole. She was previously on abemaciclib , which has been held.  CT this admission confirms metastatic osseous lesions.  Hyperlipidemia Noted. Patient is not on statin therapy as an outpatient.  Chronic pain Patient is on Percocet and gabapentin  as an outpatient. - Morphine  IV as needed while still having recurrent vomiting   DVT prophylaxis: SCDs Code Status:   Code Status: Full Code Family Communication: None at bedside. Son and daughter on telephone Disposition Plan: Discharge pending transition off IV fluids, ability to advance diet, ongoing GI recommendations   Consultants:  Gastroenterology  Procedures:  None  Antimicrobials: None    Subjective: Wants a sandwich.  Objective: BP 99/64 (BP Location: Right Arm)   Pulse 93   Temp 98.9 F (37.2 C) (Oral)   Resp 17   Ht 5' 11 (1.803 m)   Wt 76.5 kg   SpO2 98%   BMI 23.52 kg/m   Examination:  General exam: Appears calm and comfortable Respiratory system: Clear to auscultation. Respiratory effort normal. Cardiovascular system: S1 & S2 heard, RRR. No murmurs, rubs, gallops or clicks. Gastrointestinal system: Abdomen is nondistended, soft and nontender. Normal bowel sounds heard. Central nervous system: Alert and oriented. No focal neurological deficits. Musculoskeletal: No edema. No calf tenderness Skin: No cyanosis. No  rashes Psychiatry: Judgement and insight appear  normal. Mood & affect appropriate.    Data Reviewed: I have personally reviewed following labs and imaging studies  CBC Lab Results  Component Value Date   WBC 4.6 12/24/2023   RBC 3.74 (L) 12/24/2023   HGB 8.5 (L) 12/24/2023   HCT 27.7 (L) 12/24/2023   MCV 74.1 (L) 12/24/2023   MCH 22.7 (L) 12/24/2023   PLT 200 12/24/2023   MCHC 30.7 12/24/2023   RDW 14.6 12/24/2023   LYMPHSABS 1.0 12/22/2023   MONOABS 0.5 12/22/2023   EOSABS 0.0 12/22/2023   BASOSABS 0.0 12/22/2023     Last metabolic panel Lab Results  Component Value Date   NA 138 12/24/2023   K 3.1 (L) 12/24/2023   CL 106 12/24/2023   CO2 22 12/24/2023   BUN 19 12/24/2023   CREATININE 0.88 12/24/2023   GLUCOSE 101 (H) 12/24/2023   GFRNONAA >60 12/24/2023   GFRAA >60 09/07/2019   CALCIUM  7.5 (L) 12/24/2023   PHOS 2.2 (L) 12/24/2023   PROT 6.3 (L) 12/24/2023   ALBUMIN 2.9 (L) 12/24/2023   LABGLOB 2.8 11/30/2023   AGRATIO 1.5 10/06/2020   BILITOT 0.6 12/24/2023   ALKPHOS 292 (H) 12/24/2023   AST 83 (H) 12/24/2023   ALT 157 (H) 12/24/2023   ANIONGAP 10 12/24/2023    GFR: Estimated Creatinine Clearance: 84.5 mL/min (by C-G formula based on SCr of 0.88 mg/dL).  No results found for this or any previous visit (from the past 240 hours).    Radiology Studies: MR ABDOMEN MRCP W WO CONTAST Result Date: 12/23/2023 CLINICAL DATA:  Pancreatitis suspected, elevated LFTs, query pancreatic mass, history of metastatic breast cancer EXAM: MRI ABDOMEN WITHOUT AND WITH CONTRAST (INCLUDING MRCP) TECHNIQUE: Multiplanar multisequence MR imaging of the abdomen was performed both before and after the administration of intravenous contrast. Heavily T2-weighted images of the biliary and pancreatic ducts were obtained, and three-dimensional MRCP images were rendered by post processing. CONTRAST:  7mL GADAVIST  GADOBUTROL  1 MMOL/ML IV SOLN COMPARISON:  CT chest abdomen pelvis, 12/22/2023,  PET-CT, 11/24/2023, 06/10/2023 FINDINGS: Lower chest: No acute abnormality. Hepatobiliary: No solid liver abnormality is seen. No gallstones or gallbladder wall thickening. Common bile duct at the upper limit of normal in caliber measuring up to 0.7 cm (series 3, image 16). Pancreas: Subtly expansile, masslike appearance of the pancreatic head and uncinate as seen by prior CT, measuring 2.5 x 2.3 cm (series 4, image 20). There is however minimal if any associated hypoenhancement and no underlying diffusion restriction. The central pancreatic duct and central common bile duct are effaced at this level just above the ampulla. Notably, this appearance has significantly changed in the interval to prior restaging PET-CT examination dated 06/10/2023. Prominence of the pancreatic duct measuring up to 0.4 cm. Spleen: Normal in size without significant abnormality. Adrenals/Urinary Tract: Adrenal glands are unremarkable. Multiple simple renal cortical cysts, benign, requiring no further follow-up or characterization kidneys are otherwise normal, without obvious renal calculi, solid lesion, or hydronephrosis. Stomach/Bowel: Stomach is within normal limits. No evidence of bowel wall thickening, distention, or inflammatory changes. Pancolonic diverticulosis. Vascular/Lymphatic: No significant vascular findings are present. No enlarged abdominal lymph nodes. Other: No abdominal wall hernia or abnormality. No ascites. Musculoskeletal: No acute osseous findings. Heterogeneously enhancing vertebral metastases (series 20, image 20). IMPRESSION: 1. Subtly expansile, masslike appearance of the pancreatic head and uncinate as seen by prior CT, measuring 2.5 x 2.3 cm. This appearance is new in the interval to prior restaging PET-CT dated 06/10/2023, and is highly concerning for pancreatic  adenocarcinoma. 2. Common bile duct at the upper limit of normal in caliber measuring up to 0.7 cm. No gallstones. 3. Prominence of the pancreatic  duct measuring up to 0.4 cm. 4. Heterogeneously enhancing vertebral metastases, in keeping with known metastatic breast cancer. 5. Pancolonic diverticulosis. Electronically Signed   By: Marolyn JONETTA Jaksch M.D.   On: 12/23/2023 20:20   MR 3D Recon At Scanner Result Date: 12/23/2023 CLINICAL DATA:  Pancreatitis suspected, elevated LFTs, query pancreatic mass, history of metastatic breast cancer EXAM: MRI ABDOMEN WITHOUT AND WITH CONTRAST (INCLUDING MRCP) TECHNIQUE: Multiplanar multisequence MR imaging of the abdomen was performed both before and after the administration of intravenous contrast. Heavily T2-weighted images of the biliary and pancreatic ducts were obtained, and three-dimensional MRCP images were rendered by post processing. CONTRAST:  7mL GADAVIST  GADOBUTROL  1 MMOL/ML IV SOLN COMPARISON:  CT chest abdomen pelvis, 12/22/2023, PET-CT, 11/24/2023, 06/10/2023 FINDINGS: Lower chest: No acute abnormality. Hepatobiliary: No solid liver abnormality is seen. No gallstones or gallbladder wall thickening. Common bile duct at the upper limit of normal in caliber measuring up to 0.7 cm (series 3, image 16). Pancreas: Subtly expansile, masslike appearance of the pancreatic head and uncinate as seen by prior CT, measuring 2.5 x 2.3 cm (series 4, image 20). There is however minimal if any associated hypoenhancement and no underlying diffusion restriction. The central pancreatic duct and central common bile duct are effaced at this level just above the ampulla. Notably, this appearance has significantly changed in the interval to prior restaging PET-CT examination dated 06/10/2023. Prominence of the pancreatic duct measuring up to 0.4 cm. Spleen: Normal in size without significant abnormality. Adrenals/Urinary Tract: Adrenal glands are unremarkable. Multiple simple renal cortical cysts, benign, requiring no further follow-up or characterization kidneys are otherwise normal, without obvious renal calculi, solid lesion, or  hydronephrosis. Stomach/Bowel: Stomach is within normal limits. No evidence of bowel wall thickening, distention, or inflammatory changes. Pancolonic diverticulosis. Vascular/Lymphatic: No significant vascular findings are present. No enlarged abdominal lymph nodes. Other: No abdominal wall hernia or abnormality. No ascites. Musculoskeletal: No acute osseous findings. Heterogeneously enhancing vertebral metastases (series 20, image 20). IMPRESSION: 1. Subtly expansile, masslike appearance of the pancreatic head and uncinate as seen by prior CT, measuring 2.5 x 2.3 cm. 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 2. Common bile duct at the upper limit of normal in caliber measuring up to 0.7 cm. No gallstones. 3. Prominence of the pancreatic duct measuring up to 0.4 cm. 4. Heterogeneously enhancing vertebral metastases, in keeping with known metastatic breast cancer. 5. Pancolonic diverticulosis. Electronically Signed   By: Marolyn JONETTA Jaksch M.D.   On: 12/23/2023 20:20   US  Abdomen Limited RUQ (LIVER/GB) Result Date: 12/23/2023 CLINICAL DATA:  Elevated LFTs EXAM: ULTRASOUND ABDOMEN LIMITED RIGHT UPPER QUADRANT COMPARISON:  None Available. FINDINGS: Gallbladder: Patient experienced some pain during the exam. The sonographer did not no whether this constituted a sonographic Murphy's sign. The sonographer is unable to be reached at this time. Borderline wall thickening (3 mm). No cholelithiasis. No pericholecystic fluid. Common bile duct: Diameter: 5 mm Liver: No focal lesion identified. Within normal limits in parenchymal echogenicity. Portal vein is patent on color Doppler imaging with normal direction of blood flow towards the liver. Other: None. IMPRESSION: 1. Borderline gallbladder wall thickening. No cholelithiasis or pericholecystic fluid. Patient had pain during the exam but it is unclear if this constitutes a sonographic  Murphy's sign. If there is  concern for acute cholecystitis consider HIDA scan for further evaluation. 2. No biliary dilatation. Electronically Signed   By: Norman Gatlin M.D.   On: 12/23/2023 00:32   CT CHEST ABDOMEN PELVIS WO CONTRAST Result Date: 12/22/2023 CLINICAL DATA:  Bowel obstruction suspected nausea, vomiting, abdominal pain, and per cancer center hypotension. Pt has not been able to keep food or drink down. Stated she did an enema on Monday and only had a small amount of output. Also stated she has lost almost 20lbs EXAM: CT CHEST, ABDOMEN AND PELVIS WITHOUT CONTRAST TECHNIQUE: Multidetector CT imaging of the chest, abdomen and pelvis was performed following the standard protocol without IV contrast. RADIATION DOSE REDUCTION: This exam was performed according to the departmental dose-optimization program which includes automated exposure control, adjustment of the mA and/or kV according to patient size and/or use of iterative reconstruction technique. COMPARISON:  CT chest 12/12/2023, CT abdomen pelvis 12/10/2023, PET CT 11/23/2023, CT abdomen pelvis 11/17/2022 FINDINGS: CT CHEST FINDINGS Cardiovascular: Normal heart size. No significant pericardial effusion. The thoracic aorta is normal in caliber. No atherosclerotic plaque of the thoracic aorta. No coronary artery calcifications. Mediastinum/Nodes: No gross hilar adenopathy, noting limited sensitivity for the detection of hilar adenopathy on this noncontrast study. No enlarged mediastinal or axillary lymph nodes. Thyroid  gland, trachea, and esophagus demonstrate no significant findings. Lungs/Pleura: Similar-appearing bilateral upper lung zone, right greater than left, centrilobular pulmonary micronodules. No pulmonary mass. No pleural effusion. No pneumothorax. Musculoskeletal: Postsurgical changes of the bilateral breasts. No suspicious lytic or blastic osseous lesions. No acute displaced fracture. Multilevel degenerative changes of the spine. CT  ABDOMEN PELVIS FINDINGS Hepatobiliary: No focal liver abnormality. No gallstones, gallbladder wall thickening, or pericholecystic fluid. No biliary dilatation. Pancreas: Persistent vague fat stranding along the proximal pancreas with query underlying vague mass (2:68). No surrounding inflammatory changes. No main pancreatic ductal dilatation. Spleen: Normal in size without focal abnormality. Adrenals/Urinary Tract: No adrenal nodule bilaterally. No nephrolithiasis and no hydronephrosis. No definite contour-deforming renal mass. No ureterolithiasis or hydroureter. The urinary bladder is unremarkable. Stomach/Bowel: Stomach is within normal limits. No evidence of bowel wall thickening or dilatation. Appendix appears normal. Vascular/Lymphatic: No abdominal aorta or iliac aneurysm. No abdominal, pelvic, or inguinal lymphadenopathy. Reproductive: Status post hysterectomy. No adnexal masses. Other: No intraperitoneal free fluid. No intraperitoneal free gas. No organized fluid collection. Musculoskeletal: No abdominal wall hernia or abnormality. Persistent scattered axial and appendicular sclerotic osseous metastatic lesions. L3 vertebral body hemangioma with stable chronic vertebral body height loss. No acute displaced fracture. Multilevel degenerative changes of the spine. IMPRESSION: 1. Limited evaluation on this noncontrast study. 2. Similar-appearing bilateral upper lung zone, right greater than left, centrilobular pulmonary micronodules. Finding may represent infection/inflammation. Underlying malignancy not excluded. 3. Persistent vague fat stranding along the proximal pancreas with query underlying vague mass. Correlate with lipase levels. If clinically indicated, consider MRI pancreatic protocol for further evaluation. 4. Persistent scattered axial and appendicular sclerotic osseous metastatic lesions. 5. Colonic diverticulosis with no acute diverticulitis. Electronically Signed   By: Morgane  Naveau M.D.   On:  12/22/2023 17:55      LOS: 1 day    Elgin Lam, MD [facility] 12/24/2023, 9:44 AM   If 7PM-7AM, please contact night-coverage [email or web address]

## 2023-12-25 DIAGNOSIS — R748 Abnormal levels of other serum enzymes: Secondary | ICD-10-CM | POA: Diagnosis not present

## 2023-12-25 DIAGNOSIS — C50911 Malignant neoplasm of unspecified site of right female breast: Secondary | ICD-10-CM | POA: Diagnosis not present

## 2023-12-25 DIAGNOSIS — N179 Acute kidney failure, unspecified: Secondary | ICD-10-CM | POA: Diagnosis not present

## 2023-12-25 DIAGNOSIS — K8689 Other specified diseases of pancreas: Secondary | ICD-10-CM | POA: Diagnosis not present

## 2023-12-25 DIAGNOSIS — K209 Esophagitis, unspecified without bleeding: Secondary | ICD-10-CM | POA: Diagnosis not present

## 2023-12-25 LAB — VITAMIN B1: Vitamin B1 (Thiamine): 131.1 nmol/L (ref 66.5–200.0)

## 2023-12-25 LAB — COMPREHENSIVE METABOLIC PANEL WITH GFR
ALT: 117 U/L — ABNORMAL HIGH (ref 0–44)
AST: 51 U/L — ABNORMAL HIGH (ref 15–41)
Albumin: 2.9 g/dL — ABNORMAL LOW (ref 3.5–5.0)
Alkaline Phosphatase: 285 U/L — ABNORMAL HIGH (ref 38–126)
Anion gap: 10 (ref 5–15)
BUN: 10 mg/dL (ref 6–20)
CO2: 20 mmol/L — ABNORMAL LOW (ref 22–32)
Calcium: 8 mg/dL — ABNORMAL LOW (ref 8.9–10.3)
Chloride: 108 mmol/L (ref 98–111)
Creatinine, Ser: 1 mg/dL (ref 0.44–1.00)
GFR, Estimated: >60 mL/min (ref >60)
Glucose, Bld: 102 mg/dL — ABNORMAL HIGH (ref 70–99)
Potassium: 3.1 mmol/L — ABNORMAL LOW (ref 3.5–5.1)
Sodium: 138 mmol/L (ref 135–145)
Total Bilirubin: 0.5 mg/dL (ref 0.0–1.2)
Total Protein: 6.4 g/dL — ABNORMAL LOW (ref 6.5–8.1)

## 2023-12-25 LAB — MAGNESIUM: Magnesium: 1.5 mg/dL — ABNORMAL LOW (ref 1.7–2.4)

## 2023-12-25 LAB — PHOSPHORUS: Phosphorus: 2.4 mg/dL — ABNORMAL LOW (ref 2.5–4.6)

## 2023-12-25 LAB — CBC
HCT: 26.8 % — ABNORMAL LOW (ref 36.0–46.0)
Hemoglobin: 8.5 g/dL — ABNORMAL LOW (ref 12.0–15.0)
MCH: 23.5 pg — ABNORMAL LOW (ref 26.0–34.0)
MCHC: 31.7 g/dL (ref 30.0–36.0)
MCV: 74.2 fL — ABNORMAL LOW (ref 80.0–100.0)
Platelets: 187 K/uL (ref 150–400)
RBC: 3.61 MIL/uL — ABNORMAL LOW (ref 3.87–5.11)
RDW: 15 % (ref 11.5–15.5)
WBC: 4.5 K/uL (ref 4.0–10.5)
nRBC: 0 % (ref 0.0–0.2)

## 2023-12-25 MED ORDER — MAGNESIUM SULFATE 2 GM/50ML IV SOLN
2.0000 g | Freq: Once | INTRAVENOUS | Status: AC
  Administered 2023-12-25: 2 g via INTRAVENOUS
  Filled 2023-12-25: qty 50

## 2023-12-25 MED ORDER — POTASSIUM CHLORIDE 10 MEQ/100ML IV SOLN
10.0000 meq | INTRAVENOUS | Status: AC
  Administered 2023-12-25 (×6): 10 meq via INTRAVENOUS
  Filled 2023-12-25 (×6): qty 100

## 2023-12-25 MED ORDER — PROCHLORPERAZINE EDISYLATE 10 MG/2ML IJ SOLN
10.0000 mg | Freq: Four times a day (QID) | INTRAMUSCULAR | Status: AC
  Administered 2023-12-25 – 2023-12-26 (×5): 10 mg via INTRAVENOUS
  Filled 2023-12-25 (×5): qty 2

## 2023-12-25 NOTE — Progress Notes (Signed)
 PROGRESS NOTE    Chelsea Hansen  FMW:[id number] DOB: May 05, 1971 DOA: 12/22/2023 PCP: [name], NP   Brief Narrative: Chelsea Hansen is a 53 y.o. female with a history of metastatic breast cancer, duodenal stenosis, esophageal candidiasis, constipation, hyperlipidemia, hypertension, anemia, DVT, asthma.  Patient presented secondary to abdominal pain and recurrent emesis with concern for possible biliary process and with associated AKI.  Imaging concerning for possible pancreatic lesion in addition to possible gallbladder pathology.  Patient started on IV fluids.  Gastroenterology consulted for recommendations. MRCP ordered and with findings concerning for primary pancreatic adenocarcinoma. Plan for EUS.   Assessment and Plan:  AKI on CKD IIIa Secondary to intractable nausea and vomiting with resultant hypovolemia. Baseline creatinine of about 1. Creatinine of 3.25 on admission. Patient started on IV fluids. - Continue IV fluids - Daily BMP  Nausea and Vomiting Present on admission.  Unclear etiology. History of candida esophagitis on fluconazole . Having bowel movements. Abdominal x-ray without evidence of obstruction - Compazine  scheduled - Continue Zofran  and Reglan  as needed  Hypokalemia Secondary to GI losses from recurrent emesis. - Potassium supplementation as needed  Elevated AST/ALT Elevated Lipase Pancreatic mass CT chest/abdomen/pelvis and right upper quadrant ultrasound obtained on admission and significant for evidence of stranding of the proximal pancreas with possible underlying vague mass, borderline gallbladder wall thickening, with no evidence of cholelithiasis or pericholecystic fluid.  Gastroenterology was consulted on admission.  LFTs trending down. MRCP ordered and concerning for pancreatic adenocarcinoma. - Gastroenterology recommendations: plan for EUS on 7/7  Candida esophagitis Patient diagnosed on 6/24. Patient has been on fluconazole  as an  outpatient, but with recurrent emesis, has not received consistent dosing. Patient started on fluconazole . - Continue fluconazole  per GI recommendations  Hypocalcemia Low, even accounting for albumin. Calcium  gluconate given. Corrected calcium  of about 8.4. - Calcium  gluconate  Hypomagnesemia Secondary to GI losses. - Continue magnesium  supplementation  Acute on chronic anemia Baseline hemoglobin appears to be around 9-10. Hemoglobin normal at 12.7 on admission. This may be possibly related to hemoconcentration as she has drifted down towards baseline.  Hemoglobin down to 8.5 now. No episodes of bleeding noted. Hemoglobin stable today. - CBC in AM  Constipation - Continue MiraLAX   History of breast cancer Metastasis to bone Patient follows with Dr. [name] as an outpatient and is on anastrazole. She was previously on abemaciclib , which has been held.  CT this admission confirms metastatic osseous lesions.  Hyperlipidemia Noted. Patient is not on statin therapy as an outpatient.  Chronic pain Patient is on Percocet and gabapentin  as an outpatient. - Morphine  IV as needed while still having recurrent vomiting   DVT prophylaxis: SCDs Code Status:   Code Status: Full Code Family Communication: None at bedside. Son and daughter on telephone Disposition Plan: Discharge pending transition off IV fluids, ability to advance diet, ongoing GI recommendations   Consultants:  Gastroenterology  Procedures:  None  Antimicrobials: None    Subjective: Wants a sandwich.  Objective: BP 123/75 (BP Location: Right Arm)   Pulse 72   Temp 98.9 F (37.2 C) (Oral)   Resp 19   Ht 5' 11 (1.803 m)   Wt 70.4 kg   SpO2 100%   BMI 21.65 kg/m   Examination:  General exam: Appears calm and comfortable Respiratory system: Clear to auscultation. Respiratory effort normal. Cardiovascular system: S1 & S2 heard, RRR. No murmurs, rubs, gallops or clicks. Gastrointestinal system: Abdomen is  nondistended, soft and nontender. Normal bowel sounds heard.  Central nervous system: Alert and oriented. No focal neurological deficits. Musculoskeletal: No edema. No calf tenderness Skin: No cyanosis. No rashes Psychiatry: Judgement and insight appear normal. Mood & affect appropriate.    Data Reviewed: I have personally reviewed following labs and imaging studies  CBC Lab Results  Component Value Date   WBC 4.5 12/25/2023   RBC 3.61 (L) 12/25/2023   HGB 8.5 (L) 12/25/2023   HCT 26.8 (L) 12/25/2023   MCV 74.2 (L) 12/25/2023   MCH 23.5 (L) 12/25/2023   PLT 187 12/25/2023   MCHC 31.7 12/25/2023   RDW 15.0 12/25/2023   LYMPHSABS 1.0 12/22/2023   MONOABS 0.5 12/22/2023   EOSABS 0.0 12/22/2023   BASOSABS 0.0 12/22/2023     Last metabolic panel Lab Results  Component Value Date   NA 138 12/25/2023   K 3.1 (L) 12/25/2023   CL 108 12/25/2023   CO2 20 (L) 12/25/2023   BUN 10 12/25/2023   CREATININE 1.00 12/25/2023   GLUCOSE 102 (H) 12/25/2023   GFRNONAA >60 12/25/2023   GFRAA >60 09/07/2019   CALCIUM  8.0 (L) 12/25/2023   PHOS 2.4 (L) 12/25/2023   PROT 6.4 (L) 12/25/2023   ALBUMIN 2.9 (L) 12/25/2023   LABGLOB 2.8 11/30/2023   AGRATIO 1.5 10/06/2020   BILITOT 0.5 12/25/2023   ALKPHOS 285 (H) 12/25/2023   AST 51 (H) 12/25/2023   ALT 117 (H) 12/25/2023   ANIONGAP 10 12/25/2023    GFR: Estimated Creatinine Clearance: 74 mL/min (by C-G formula based on SCr of 1 mg/dL).  No results found for this or any previous visit (from the past 240 hours).    Radiology Studies: DG Abd Portable 1V Result Date: 12/24/2023 CLINICAL DATA:  Intractable nausea, vomiting EXAM: PORTABLE ABDOMEN - 1 VIEW COMPARISON:  12/12/2023.  CT 12/22/2023 FINDINGS: The bowel gas pattern is normal. No radio-opaque calculi or other significant radiographic abnormality are seen. IMPRESSION: Negative. Electronically Signed   By: Franky Crease M.D.   On: 12/24/2023 19:26   MR ABDOMEN MRCP W WO  CONTAST Result Date: 12/23/2023 CLINICAL DATA:  Pancreatitis suspected, elevated LFTs, query pancreatic mass, history of metastatic breast cancer EXAM: MRI ABDOMEN WITHOUT AND WITH CONTRAST (INCLUDING MRCP) TECHNIQUE: Multiplanar multisequence MR imaging of the abdomen was performed both before and after the administration of intravenous contrast. Heavily T2-weighted images of the biliary and pancreatic ducts were obtained, and three-dimensional MRCP images were rendered by post processing. CONTRAST:  7mL GADAVIST  GADOBUTROL  1 MMOL/ML IV SOLN COMPARISON:  CT chest abdomen pelvis, 12/22/2023, PET-CT, 11/24/2023, 06/10/2023 FINDINGS: Lower chest: No acute abnormality. Hepatobiliary: No solid liver abnormality is seen. No gallstones or gallbladder wall thickening. Common bile duct at the upper limit of normal in caliber measuring up to 0.7 cm (series 3, image 16). Pancreas: Subtly expansile, masslike appearance of the pancreatic head and uncinate as seen by prior CT, measuring 2.5 x 2.3 cm (series 4, image 20). There is however minimal if any associated hypoenhancement and no underlying diffusion restriction. The central pancreatic duct and central common bile duct are effaced at this level just above the ampulla. Notably, this appearance has significantly changed in the interval to prior restaging PET-CT examination dated 06/10/2023. Prominence of the pancreatic duct measuring up to 0.4 cm. Spleen: Normal in size without significant abnormality. Adrenals/Urinary Tract: Adrenal glands are unremarkable. Multiple simple renal cortical cysts, benign, requiring no further follow-up or characterization kidneys are otherwise normal, without obvious renal calculi, solid lesion, or hydronephrosis. Stomach/Bowel: Stomach is within normal limits.  No evidence of bowel wall thickening, distention, or inflammatory changes. Pancolonic diverticulosis. Vascular/Lymphatic: No significant vascular findings are present. No enlarged  abdominal lymph nodes. Other: No abdominal wall hernia or abnormality. No ascites. Musculoskeletal: No acute osseous findings. Heterogeneously enhancing vertebral metastases (series 20, image 20). IMPRESSION: 1. Subtly expansile, masslike appearance of the pancreatic head and uncinate as seen by prior CT, measuring 2.5 x 2.3 cm. 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 2. Common bile duct at the upper limit of normal in caliber measuring up to 0.7 cm. No gallstones. 3. Prominence of the pancreatic duct measuring up to 0.4 cm. 4. Heterogeneously enhancing vertebral metastases, in keeping with known metastatic breast cancer. 5. Pancolonic diverticulosis. Electronically Signed   By: Marolyn JONETTA Jaksch M.D.   On: 12/23/2023 20:20   MR 3D Recon At Scanner Result Date: 12/23/2023 CLINICAL DATA:  Pancreatitis suspected, elevated LFTs, query pancreatic mass, history of metastatic breast cancer EXAM: MRI ABDOMEN WITHOUT AND WITH CONTRAST (INCLUDING MRCP) TECHNIQUE: Multiplanar multisequence MR imaging of the abdomen was performed both before and after the administration of intravenous contrast. Heavily T2-weighted images of the biliary and pancreatic ducts were obtained, and three-dimensional MRCP images were rendered by post processing. CONTRAST:  7mL GADAVIST  GADOBUTROL  1 MMOL/ML IV SOLN COMPARISON:  CT chest abdomen pelvis, 12/22/2023, PET-CT, 11/24/2023, 06/10/2023 FINDINGS: Lower chest: No acute abnormality. Hepatobiliary: No solid liver abnormality is seen. No gallstones or gallbladder wall thickening. Common bile duct at the upper limit of normal in caliber measuring up to 0.7 cm (series 3, image 16). Pancreas: Subtly expansile, masslike appearance of the pancreatic head and uncinate as seen by prior CT, measuring 2.5 x 2.3 cm (series 4, image 20). There is however minimal if any associated hypoenhancement and no underlying  diffusion restriction. The central pancreatic duct and central common bile duct are effaced at this level just above the ampulla. Notably, this appearance has significantly changed in the interval to prior restaging PET-CT examination dated 06/10/2023. Prominence of the pancreatic duct measuring up to 0.4 cm. Spleen: Normal in size without significant abnormality. Adrenals/Urinary Tract: Adrenal glands are unremarkable. Multiple simple renal cortical cysts, benign, requiring no further follow-up or characterization kidneys are otherwise normal, without obvious renal calculi, solid lesion, or hydronephrosis. Stomach/Bowel: Stomach is within normal limits. No evidence of bowel wall thickening, distention, or inflammatory changes. Pancolonic diverticulosis. Vascular/Lymphatic: No significant vascular findings are present. No enlarged abdominal lymph nodes. Other: No abdominal wall hernia or abnormality. No ascites. Musculoskeletal: No acute osseous findings. Heterogeneously enhancing vertebral metastases (series 20, image 20). IMPRESSION: 1. Subtly expansile, masslike appearance of the pancreatic head and uncinate as seen by prior CT, measuring 2.5 x 2.3 cm. 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 2. Common bile duct at the upper limit of normal in caliber measuring up to 0.7 cm. No gallstones. 3. Prominence of the pancreatic duct measuring up to 0.4 cm. 4. Heterogeneously enhancing vertebral metastases, in keeping with known metastatic breast cancer. 5. Pancolonic diverticulosis. Electronically Signed   By: Marolyn JONETTA Jaksch M.D.   On: 12/23/2023 20:20      LOS: 2 days    Elgin Lam, MD [facility] 12/25/2023, 10:01 AM   If 7PM-7AM, please contact night-coverage [email or web address]

## 2023-12-25 NOTE — H&P (View-Only) (Signed)
 Progress Note   Assessment    53 y.o. 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 intake and upper abdominal pain with weight loss with MRI showing a masslike appearance of the pancreatic head   Principal Problem:   AKI (acute kidney injury) (HCC) Active Problems:   Hypokalemia   HLD (hyperlipidemia)   Breast cancer metastasized to bone, right (HCC)   Constipation   Acute kidney injury superimposed on chronic kidney disease (HCC)   Dehydration   Prolonged QT interval   Hypocalcemia   Acute esophagitis   Candida esophagitis (HCC)   Transaminitis   Pancreatic mass   Right sided weakness   Hypotension   Abnormal CT of the abdomen   Elevated liver enzymes   Acute kidney injury (HCC)   Recommendations   1.  Concern for pancreatic head mass/upper abdominal pain with nausea vomiting and elevated liver enzymes --nausea vomiting has continued.  This may be related to stenosis and second portion of duodenum but also possibly pancreatic head mass.  Liver enzymes remain elevated but are downtrending. -- Scheduled for EUS; likely component of EGD tomorrow as well with Dr. Wilhelmenia around 1 PM -- Would remain on clear liquids given ongoing vomiting -- Prochlorperazine  has been scheduled to help with nausea -- N.p.o. after midnight -- Trend liver enzymes  2.  Recent diagnosis of esophagitis and candidiasis -- Continue PPI and fluconazole  therapy  3.  Hypokalemia and hypomagnesemia --potassium and magnesium  need to be replaced; also important to have normalization prior to anesthesia  tomorrow -- Replacement per primary team   Chief Complaint   Despite liquid diet patient had at least 3 episodes of vomiting; described as clear yellow liquid Some pyrosis Some upper abdominal pain relieved by IV narcotics Passing flatus Has questions about EUS tomorrow which we discussed  Vital signs in last 24 hours: Temp:  [97.8 F (36.6 C)-99.1 F (37.3 C)] 98.9 F (37.2 C) (07/06 0433) Pulse Rate:  [68-83] 72 (07/06 0433) Resp:  [16-19] 19 (07/06 0433) BP: (113-123)/(73-85) 123/75 (07/06 0433) SpO2:  [100 %] 100 % (07/06 0433) Weight:  [70.4 kg] 70.4 kg (07/06 0434)  Gen: awake, alert, NAD HEENT: anicteric  CV: RRR, no mrg Pulm: CTA b/l Abd: soft, tender in the upper abdomen without rebound or guarding, +BS throughout Ext: no c/c/e Neuro: nonfocal   Intake/Output from previous day: 07/05 0701 - 07/06 0700 In: 2479.7 [I.V.:2379.7; IV Piggyback:100] Out: 1200 [Urine:1200] Intake/Output this shift: No intake/output data recorded.  Lab Results: Recent Labs    12/23/23 0146 12/24/23 0329 12/25/23 0346  WBC 6.3 4.6 4.5  HGB 9.9* 8.5* 8.5*  HCT 32.0* 27.7* 26.8*  PLT 253 200 187   BMET Recent Labs    12/23/23 0146 12/23/23 0936 12/24/23 0329 12/25/23 0346  NA 135  --  138 138  K 2.6* 3.5 3.1* 3.1*  CL 97*  --  106 108  CO2 23  --  22 20*  GLUCOSE 109*  --  101* 102*  BUN 44*  --  19 10  CREATININE 2.13*  --  0.88 1.00  CALCIUM  7.1*  --  7.5* 8.0*   LFT Recent Labs  12/25/23 0346  PROT 6.4*  ALBUMIN 2.9*  AST 51*  ALT 117*  ALKPHOS 285*  BILITOT 0.5   PT/INR Recent Labs    12/22/23 2233  LABPROT 15.5*  INR 1.2   Hepatitis Panel Recent Labs    12/23/23 0146  HEPBSAG NON REACTIVE  HCVAB NON REACTIVE  HEPAIGM NON REACTIVE  HEPBIGM NON REACTIVE    Studies/Results: DG Abd Portable 1V Result Date: 12/24/2023 CLINICAL DATA:  Intractable nausea, vomiting EXAM: PORTABLE ABDOMEN - 1 VIEW COMPARISON:  12/12/2023.  CT 12/22/2023  FINDINGS: The bowel gas pattern is normal. No radio-opaque calculi or other significant radiographic abnormality are seen. IMPRESSION: Negative. Electronically Signed   By: Franky Crease M.D.   On: 12/24/2023 19:26   MR ABDOMEN MRCP W WO CONTAST Result Date: 12/23/2023 CLINICAL DATA:  Pancreatitis suspected, elevated LFTs, query pancreatic mass, history of metastatic breast cancer EXAM: MRI ABDOMEN WITHOUT AND WITH CONTRAST (INCLUDING MRCP) TECHNIQUE: Multiplanar multisequence MR imaging of the abdomen was performed both before and after the administration of intravenous contrast. Heavily T2-weighted images of the biliary and pancreatic ducts were obtained, and three-dimensional MRCP images were rendered by post processing. CONTRAST:  7mL GADAVIST  GADOBUTROL  1 MMOL/ML IV SOLN COMPARISON:  CT chest abdomen pelvis, 12/22/2023, PET-CT, 11/24/2023, 06/10/2023 FINDINGS: Lower chest: No acute abnormality. Hepatobiliary: No solid liver abnormality is seen. No gallstones or gallbladder wall thickening. Common bile duct at the upper limit of normal in caliber measuring up to 0.7 cm (series 3, image 16). Pancreas: Subtly expansile, masslike appearance of the pancreatic head and uncinate as seen by prior CT, measuring 2.5 x 2.3 cm (series 4, image 20). There is however minimal if any associated hypoenhancement and no underlying diffusion restriction. The central pancreatic duct and central common bile duct are effaced at this level just above the ampulla. Notably, this appearance has significantly changed in the interval to prior restaging PET-CT examination dated 06/10/2023. Prominence of the pancreatic duct measuring up to 0.4 cm. Spleen: Normal in size without significant abnormality. Adrenals/Urinary Tract: Adrenal glands are unremarkable. Multiple simple renal cortical cysts, benign, requiring no further follow-up or characterization kidneys are otherwise normal, without obvious renal calculi, solid lesion, or  hydronephrosis. Stomach/Bowel: Stomach is within normal limits. No evidence of bowel wall thickening, distention, or inflammatory changes. Pancolonic diverticulosis. Vascular/Lymphatic: No significant vascular findings are present. No enlarged abdominal lymph nodes. Other: No abdominal wall hernia or abnormality. No ascites. Musculoskeletal: No acute osseous findings. Heterogeneously enhancing vertebral metastases (series 20, image 20). IMPRESSION: 1. Subtly expansile, masslike appearance of the pancreatic head and uncinate as seen by prior CT, measuring 2.5 x 2.3 cm. 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 2. Common bile duct at the upper limit of normal in caliber measuring up to 0.7 cm. No gallstones. 3. Prominence of the pancreatic duct measuring up to 0.4 cm. 4. Heterogeneously enhancing vertebral metastases, in keeping with known metastatic breast cancer. 5. Pancolonic diverticulosis. Electronically Signed   By: Marolyn JONETTA Jaksch M.D.   On: 12/23/2023 20:20   MR 3D Recon At Scanner Result Date: 12/23/2023 CLINICAL DATA:  Pancreatitis suspected, elevated LFTs, query pancreatic mass, history of metastatic breast cancer EXAM: MRI ABDOMEN WITHOUT AND WITH CONTRAST (INCLUDING MRCP) TECHNIQUE: Multiplanar multisequence MR imaging of the abdomen was performed both before and after the administration of intravenous contrast. Heavily T2-weighted images of the biliary and pancreatic ducts were obtained, and three-dimensional MRCP images were  rendered by post processing. CONTRAST:  7mL GADAVIST  GADOBUTROL  1 MMOL/ML IV SOLN COMPARISON:  CT chest abdomen pelvis, 12/22/2023, PET-CT, 11/24/2023, 06/10/2023 FINDINGS: Lower chest: No acute abnormality. Hepatobiliary: No solid liver abnormality is seen. No gallstones or gallbladder wall thickening. Common bile duct at the upper limit of normal in caliber measuring up to 0.7 cm  (series 3, image 16). Pancreas: Subtly expansile, masslike appearance of the pancreatic head and uncinate as seen by prior CT, measuring 2.5 x 2.3 cm (series 4, image 20). There is however minimal if any associated hypoenhancement and no underlying diffusion restriction. The central pancreatic duct and central common bile duct are effaced at this level just above the ampulla. Notably, this appearance has significantly changed in the interval to prior restaging PET-CT examination dated 06/10/2023. Prominence of the pancreatic duct measuring up to 0.4 cm. Spleen: Normal in size without significant abnormality. Adrenals/Urinary Tract: Adrenal glands are unremarkable. Multiple simple renal cortical cysts, benign, requiring no further follow-up or characterization kidneys are otherwise normal, without obvious renal calculi, solid lesion, or hydronephrosis. Stomach/Bowel: Stomach is within normal limits. No evidence of bowel wall thickening, distention, or inflammatory changes. Pancolonic diverticulosis. Vascular/Lymphatic: No significant vascular findings are present. No enlarged abdominal lymph nodes. Other: No abdominal wall hernia or abnormality. No ascites. Musculoskeletal: No acute osseous findings. Heterogeneously enhancing vertebral metastases (series 20, image 20). IMPRESSION: 1. Subtly expansile, masslike appearance of the pancreatic head and uncinate as seen by prior CT, measuring 2.5 x 2.3 cm. 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 2. Common bile duct at the upper limit of normal in caliber measuring up to 0.7 cm. No gallstones. 3. Prominence of the pancreatic duct measuring up to 0.4 cm. 4. Heterogeneously enhancing vertebral metastases, in keeping with known metastatic breast cancer. 5. Pancolonic diverticulosis. Electronically Signed   By: Marolyn JONETTA Jaksch M.D.   On: 12/23/2023 20:20      LOS: 2 days   Gordy CHRISTELLA Starch, MD 12/25/2023, 11:32 AM See [facility], [facility], to contact our on call provider

## 2023-12-25 NOTE — Progress Notes (Addendum)
 Progress Note   Assessment    53 y.o. 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 intake and upper abdominal pain with weight loss with MRI showing a masslike appearance of the pancreatic head   Principal Problem:   AKI (acute kidney injury) (HCC) Active Problems:   Hypokalemia   HLD (hyperlipidemia)   Breast cancer metastasized to bone, right (HCC)   Constipation   Acute kidney injury superimposed on chronic kidney disease (HCC)   Dehydration   Prolonged QT interval   Hypocalcemia   Acute esophagitis   Candida esophagitis (HCC)   Transaminitis   Pancreatic mass   Right sided weakness   Hypotension   Abnormal CT of the abdomen   Elevated liver enzymes   Acute kidney injury (HCC)   Recommendations   1.  Concern for pancreatic head mass/upper abdominal pain with nausea vomiting and elevated liver enzymes --nausea vomiting has continued.  This may be related to stenosis and second portion of duodenum but also possibly pancreatic head mass.  Liver enzymes remain elevated but are downtrending. -- Scheduled for EUS; likely component of EGD tomorrow as well with Dr. Wilhelmenia around 1 PM -- Would remain on clear liquids given ongoing vomiting -- Prochlorperazine  has been scheduled to help with nausea -- N.p.o. after midnight -- Trend liver enzymes  2.  Recent diagnosis of esophagitis and candidiasis -- Continue PPI and fluconazole  therapy  3.  Hypokalemia and hypomagnesemia --potassium and magnesium  need to be replaced; also important to have normalization prior to anesthesia  tomorrow -- Replacement per primary team   Chief Complaint   Despite liquid diet patient had at least 3 episodes of vomiting; described as clear yellow liquid Some pyrosis Some upper abdominal pain relieved by IV narcotics Passing flatus Has questions about EUS tomorrow which we discussed  Vital signs in last 24 hours: Temp:  [97.8 F (36.6 C)-99.1 F (37.3 C)] 98.9 F (37.2 C) (07/06 0433) Pulse Rate:  [68-83] 72 (07/06 0433) Resp:  [16-19] 19 (07/06 0433) BP: (113-123)/(73-85) 123/75 (07/06 0433) SpO2:  [100 %] 100 % (07/06 0433) Weight:  [70.4 kg] 70.4 kg (07/06 0434)  Gen: awake, alert, NAD HEENT: anicteric  CV: RRR, no mrg Pulm: CTA b/l Abd: soft, tender in the upper abdomen without rebound or guarding, +BS throughout Ext: no c/c/e Neuro: nonfocal   Intake/Output from previous day: 07/05 0701 - 07/06 0700 In: 2479.7 [I.V.:2379.7; IV Piggyback:100] Out: 1200 [Urine:1200] Intake/Output this shift: No intake/output data recorded.  Lab Results: Recent Labs    12/23/23 0146 12/24/23 0329 12/25/23 0346  WBC 6.3 4.6 4.5  HGB 9.9* 8.5* 8.5*  HCT 32.0* 27.7* 26.8*  PLT 253 200 187   BMET Recent Labs    12/23/23 0146 12/23/23 0936 12/24/23 0329 12/25/23 0346  NA 135  --  138 138  K 2.6* 3.5 3.1* 3.1*  CL 97*  --  106 108  CO2 23  --  22 20*  GLUCOSE 109*  --  101* 102*  BUN 44*  --  19 10  CREATININE 2.13*  --  0.88 1.00  CALCIUM  7.1*  --  7.5* 8.0*   LFT Recent Labs  12/25/23 0346  PROT 6.4*  ALBUMIN 2.9*  AST 51*  ALT 117*  ALKPHOS 285*  BILITOT 0.5   PT/INR Recent Labs    12/22/23 2233  LABPROT 15.5*  INR 1.2   Hepatitis Panel Recent Labs    12/23/23 0146  HEPBSAG NON REACTIVE  HCVAB NON REACTIVE  HEPAIGM NON REACTIVE  HEPBIGM NON REACTIVE    Studies/Results: DG Abd Portable 1V Result Date: 12/24/2023 CLINICAL DATA:  Intractable nausea, vomiting EXAM: PORTABLE ABDOMEN - 1 VIEW COMPARISON:  12/12/2023.  CT 12/22/2023  FINDINGS: The bowel gas pattern is normal. No radio-opaque calculi or other significant radiographic abnormality are seen. IMPRESSION: Negative. Electronically Signed   By: Franky Crease M.D.   On: 12/24/2023 19:26   MR ABDOMEN MRCP W WO CONTAST Result Date: 12/23/2023 CLINICAL DATA:  Pancreatitis suspected, elevated LFTs, query pancreatic mass, history of metastatic breast cancer EXAM: MRI ABDOMEN WITHOUT AND WITH CONTRAST (INCLUDING MRCP) TECHNIQUE: Multiplanar multisequence MR imaging of the abdomen was performed both before and after the administration of intravenous contrast. Heavily T2-weighted images of the biliary and pancreatic ducts were obtained, and three-dimensional MRCP images were rendered by post processing. CONTRAST:  7mL GADAVIST  GADOBUTROL  1 MMOL/ML IV SOLN COMPARISON:  CT chest abdomen pelvis, 12/22/2023, PET-CT, 11/24/2023, 06/10/2023 FINDINGS: Lower chest: No acute abnormality. Hepatobiliary: No solid liver abnormality is seen. No gallstones or gallbladder wall thickening. Common bile duct at the upper limit of normal in caliber measuring up to 0.7 cm (series 3, image 16). Pancreas: Subtly expansile, masslike appearance of the pancreatic head and uncinate as seen by prior CT, measuring 2.5 x 2.3 cm (series 4, image 20). There is however minimal if any associated hypoenhancement and no underlying diffusion restriction. The central pancreatic duct and central common bile duct are effaced at this level just above the ampulla. Notably, this appearance has significantly changed in the interval to prior restaging PET-CT examination dated 06/10/2023. Prominence of the pancreatic duct measuring up to 0.4 cm. Spleen: Normal in size without significant abnormality. Adrenals/Urinary Tract: Adrenal glands are unremarkable. Multiple simple renal cortical cysts, benign, requiring no further follow-up or characterization kidneys are otherwise normal, without obvious renal calculi, solid lesion, or  hydronephrosis. Stomach/Bowel: Stomach is within normal limits. No evidence of bowel wall thickening, distention, or inflammatory changes. Pancolonic diverticulosis. Vascular/Lymphatic: No significant vascular findings are present. No enlarged abdominal lymph nodes. Other: No abdominal wall hernia or abnormality. No ascites. Musculoskeletal: No acute osseous findings. Heterogeneously enhancing vertebral metastases (series 20, image 20). IMPRESSION: 1. Subtly expansile, masslike appearance of the pancreatic head and uncinate as seen by prior CT, measuring 2.5 x 2.3 cm. 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 2. Common bile duct at the upper limit of normal in caliber measuring up to 0.7 cm. No gallstones. 3. Prominence of the pancreatic duct measuring up to 0.4 cm. 4. Heterogeneously enhancing vertebral metastases, in keeping with known metastatic breast cancer. 5. Pancolonic diverticulosis. Electronically Signed   By: Marolyn JONETTA Jaksch M.D.   On: 12/23/2023 20:20   MR 3D Recon At Scanner Result Date: 12/23/2023 CLINICAL DATA:  Pancreatitis suspected, elevated LFTs, query pancreatic mass, history of metastatic breast cancer EXAM: MRI ABDOMEN WITHOUT AND WITH CONTRAST (INCLUDING MRCP) TECHNIQUE: Multiplanar multisequence MR imaging of the abdomen was performed both before and after the administration of intravenous contrast. Heavily T2-weighted images of the biliary and pancreatic ducts were obtained, and three-dimensional MRCP images were  rendered by post processing. CONTRAST:  7mL GADAVIST  GADOBUTROL  1 MMOL/ML IV SOLN COMPARISON:  CT chest abdomen pelvis, 12/22/2023, PET-CT, 11/24/2023, 06/10/2023 FINDINGS: Lower chest: No acute abnormality. Hepatobiliary: No solid liver abnormality is seen. No gallstones or gallbladder wall thickening. Common bile duct at the upper limit of normal in caliber measuring up to 0.7 cm  (series 3, image 16). Pancreas: Subtly expansile, masslike appearance of the pancreatic head and uncinate as seen by prior CT, measuring 2.5 x 2.3 cm (series 4, image 20). There is however minimal if any associated hypoenhancement and no underlying diffusion restriction. The central pancreatic duct and central common bile duct are effaced at this level just above the ampulla. Notably, this appearance has significantly changed in the interval to prior restaging PET-CT examination dated 06/10/2023. Prominence of the pancreatic duct measuring up to 0.4 cm. Spleen: Normal in size without significant abnormality. Adrenals/Urinary Tract: Adrenal glands are unremarkable. Multiple simple renal cortical cysts, benign, requiring no further follow-up or characterization kidneys are otherwise normal, without obvious renal calculi, solid lesion, or hydronephrosis. Stomach/Bowel: Stomach is within normal limits. No evidence of bowel wall thickening, distention, or inflammatory changes. Pancolonic diverticulosis. Vascular/Lymphatic: No significant vascular findings are present. No enlarged abdominal lymph nodes. Other: No abdominal wall hernia or abnormality. No ascites. Musculoskeletal: No acute osseous findings. Heterogeneously enhancing vertebral metastases (series 20, image 20). IMPRESSION: 1. Subtly expansile, masslike appearance of the pancreatic head and uncinate as seen by prior CT, measuring 2.5 x 2.3 cm. 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 2. Common bile duct at the upper limit of normal in caliber measuring up to 0.7 cm. No gallstones. 3. Prominence of the pancreatic duct measuring up to 0.4 cm. 4. Heterogeneously enhancing vertebral metastases, in keeping with known metastatic breast cancer. 5. Pancolonic diverticulosis. Electronically Signed   By: Marolyn JONETTA Jaksch M.D.   On: 12/23/2023 20:20      LOS: 2 days   Gordy CHRISTELLA Starch, MD 12/25/2023, 11:32 AM See [facility], [facility], to contact our on call provider

## 2023-12-26 ENCOUNTER — Inpatient Hospital Stay (HOSPITAL_COMMUNITY): Payer: MEDICAID | Admitting: Anesthesiology

## 2023-12-26 ENCOUNTER — Encounter (HOSPITAL_COMMUNITY): Admission: EM | Discharge status: Against Medical Advice | Payer: Self-pay | Attending: Internal Medicine

## 2023-12-26 ENCOUNTER — Encounter (HOSPITAL_COMMUNITY): Payer: Self-pay | Admitting: Family Medicine

## 2023-12-26 ENCOUNTER — Other Ambulatory Visit: Payer: Self-pay

## 2023-12-26 DIAGNOSIS — K315 Obstruction of duodenum: Secondary | ICD-10-CM | POA: Diagnosis not present

## 2023-12-26 DIAGNOSIS — K838 Other specified diseases of biliary tract: Secondary | ICD-10-CM

## 2023-12-26 DIAGNOSIS — K3189 Other diseases of stomach and duodenum: Secondary | ICD-10-CM | POA: Diagnosis not present

## 2023-12-26 DIAGNOSIS — K2289 Other specified disease of esophagus: Secondary | ICD-10-CM | POA: Diagnosis not present

## 2023-12-26 DIAGNOSIS — R112 Nausea with vomiting, unspecified: Secondary | ICD-10-CM

## 2023-12-26 DIAGNOSIS — R935 Abnormal findings on diagnostic imaging of other abdominal regions, including retroperitoneum: Secondary | ICD-10-CM

## 2023-12-26 DIAGNOSIS — K209 Esophagitis, unspecified without bleeding: Secondary | ICD-10-CM | POA: Diagnosis not present

## 2023-12-26 DIAGNOSIS — F418 Other specified anxiety disorders: Secondary | ICD-10-CM | POA: Diagnosis not present

## 2023-12-26 DIAGNOSIS — I899 Noninfective disorder of lymphatic vessels and lymph nodes, unspecified: Secondary | ICD-10-CM | POA: Diagnosis not present

## 2023-12-26 DIAGNOSIS — I471 Supraventricular tachycardia, unspecified: Secondary | ICD-10-CM | POA: Diagnosis not present

## 2023-12-26 DIAGNOSIS — C50911 Malignant neoplasm of unspecified site of right female breast: Secondary | ICD-10-CM

## 2023-12-26 DIAGNOSIS — K8689 Other specified diseases of pancreas: Secondary | ICD-10-CM | POA: Diagnosis not present

## 2023-12-26 DIAGNOSIS — R748 Abnormal levels of other serum enzymes: Secondary | ICD-10-CM

## 2023-12-26 DIAGNOSIS — C7951 Secondary malignant neoplasm of bone: Secondary | ICD-10-CM

## 2023-12-26 HISTORY — PX: ESOPHAGOGASTRODUODENOSCOPY: SHX5428

## 2023-12-26 HISTORY — PX: EUS: SHX5427

## 2023-12-26 LAB — COMPREHENSIVE METABOLIC PANEL WITH GFR
ALT: 95 U/L — ABNORMAL HIGH (ref 0–44)
AST: 40 U/L (ref 15–41)
Albumin: 3 g/dL — ABNORMAL LOW (ref 3.5–5.0)
Alkaline Phosphatase: 291 U/L — ABNORMAL HIGH (ref 38–126)
Anion gap: 9 (ref 5–15)
BUN: 6 mg/dL (ref 6–20)
CO2: 19 mmol/L — ABNORMAL LOW (ref 22–32)
Calcium: 7.3 mg/dL — ABNORMAL LOW (ref 8.9–10.3)
Chloride: 110 mmol/L (ref 98–111)
Creatinine, Ser: 0.86 mg/dL (ref 0.44–1.00)
GFR, Estimated: >60 mL/min (ref >60)
Glucose, Bld: 83 mg/dL (ref 70–99)
Potassium: 3.8 mmol/L (ref 3.5–5.1)
Sodium: 138 mmol/L (ref 135–145)
Total Bilirubin: 0.7 mg/dL (ref 0.0–1.2)
Total Protein: 6.5 g/dL (ref 6.5–8.1)

## 2023-12-26 LAB — CBC
HCT: 28.4 % — ABNORMAL LOW (ref 36.0–46.0)
Hemoglobin: 8.9 g/dL — ABNORMAL LOW (ref 12.0–15.0)
MCH: 23.9 pg — ABNORMAL LOW (ref 26.0–34.0)
MCHC: 31.3 g/dL (ref 30.0–36.0)
MCV: 76.3 fL — ABNORMAL LOW (ref 80.0–100.0)
Platelets: 268 K/uL (ref 150–400)
RBC: 3.72 MIL/uL — ABNORMAL LOW (ref 3.87–5.11)
RDW: 15.7 % — ABNORMAL HIGH (ref 11.5–15.5)
WBC: 5.2 K/uL (ref 4.0–10.5)
nRBC: 0 % (ref 0.0–0.2)

## 2023-12-26 LAB — PHOSPHORUS: Phosphorus: 1.7 mg/dL — ABNORMAL LOW (ref 2.5–4.6)

## 2023-12-26 LAB — MAGNESIUM: Magnesium: 1.7 mg/dL (ref 1.7–2.4)

## 2023-12-26 SURGERY — ULTRASOUND, UPPER GI TRACT, ENDOSCOPIC
Anesthesia: Monitor Anesthesia Care

## 2023-12-26 MED ORDER — PROPOFOL 500 MG/50ML IV EMUL
INTRAVENOUS | Status: AC
Start: 2023-12-26 — End: 2023-12-26
  Filled 2023-12-26: qty 50

## 2023-12-26 MED ORDER — PROPOFOL 10 MG/ML IV BOLUS
INTRAVENOUS | Status: AC
  Filled 2023-12-26: qty 20

## 2023-12-26 MED ORDER — PHENYLEPHRINE HCL (PRESSORS) 10 MG/ML IV SOLN
INTRAVENOUS | Status: DC | PRN
  Administered 2023-12-26: 80 ug via INTRAVENOUS

## 2023-12-26 MED ORDER — EPHEDRINE SULFATE (PRESSORS) 50 MG/ML IJ SOLN
INTRAMUSCULAR | Status: DC | PRN
  Administered 2023-12-26 (×2): 5 mg via INTRAVENOUS

## 2023-12-26 MED ORDER — PROPOFOL 500 MG/50ML IV EMUL
INTRAVENOUS | Status: DC | PRN
  Administered 2023-12-26: 50 mg via INTRAVENOUS
  Administered 2023-12-26: 135 ug/kg/min via INTRAVENOUS
  Administered 2023-12-26: 50 mg via INTRAVENOUS

## 2023-12-26 MED ORDER — SODIUM CHLORIDE 0.9 % IV SOLN: INTRAVENOUS | Status: DC

## 2023-12-26 MED ORDER — DEXMEDETOMIDINE HCL IN NACL 80 MCG/20ML IV SOLN
INTRAVENOUS | Status: DC | PRN
  Administered 2023-12-26: 8 ug via INTRAVENOUS
  Administered 2023-12-26 (×2): 4 ug via INTRAVENOUS

## 2023-12-26 MED ORDER — DEXMEDETOMIDINE HCL IN NACL 80 MCG/20ML IV SOLN
INTRAVENOUS | Status: AC
  Filled 2023-12-26: qty 20

## 2023-12-26 MED ORDER — ONDANSETRON HCL 4 MG/2ML IJ SOLN
INTRAMUSCULAR | Status: AC
  Filled 2023-12-26: qty 2

## 2023-12-26 MED ORDER — MAGNESIUM SULFATE 2 GM/50ML IV SOLN
2.0000 g | Freq: Once | INTRAVENOUS | Status: AC
  Administered 2023-12-26: 2 g via INTRAVENOUS
  Filled 2023-12-26: qty 50

## 2023-12-26 MED ORDER — LORAZEPAM 2 MG/ML IJ SOLN
0.5000 mg | Freq: Four times a day (QID) | INTRAMUSCULAR | Status: DC
  Administered 2023-12-26 – 2023-12-27 (×4): 0.5 mg via INTRAVENOUS
  Filled 2023-12-26 (×4): qty 1

## 2023-12-26 MED ORDER — K PHOS MONO-SOD PHOS DI & MONO 155-852-130 MG PO TABS
500.0000 mg | ORAL_TABLET | Freq: Three times a day (TID) | ORAL | Status: AC
  Filled 2023-12-26 (×2): qty 2

## 2023-12-26 MED ORDER — GLYCOPYRROLATE 0.2 MG/ML IJ SOLN
INTRAMUSCULAR | Status: DC | PRN
  Administered 2023-12-26 (×2): .1 mg via INTRAVENOUS

## 2023-12-26 NOTE — Progress Notes (Cosign Needed Addendum)
 [facility] Progress Note  CC:  N/V, pancreatic mass   Subjective: She remains NPO for EUS this afternoon.  She continues to have nausea and vomiting despite scheduled Compazine  and ondansetron /Reglan  as needed.  She continues to have generalized abdominal pain more pronounced to the upper abdomen.  No bloody or black stools.  No chest pain or shortness of breath.   Objective:  Vital signs in last 24 hours: Temp:  [98.4 F (36.9 C)-98.6 F (37 C)] 98.6 F (37 C) (07/07 0531) Pulse Rate:  [65-78] 78 (07/07 0531) Resp:  [14-20] 20 (07/07 0531) BP: (95-122)/(62-83) 95/62 (07/07 0531) SpO2:  [97 %-100 %] 97 % (07/07 0531)   General: Fatigued appearing 53 year old female in no acute distress. Heart: No murmurs. Pulm: Sounds clear throughout. Abdomen: Generalized tenderness, upper abdomen > lower abdomen, without rebound or guarding.  Positive bowel sounds to all 4 quadrants. Extremities:  No edema. Neurologic:  Alert and  oriented x 4. Grossly normal neurologically. Psych:  Alert and cooperative. Normal mood and affect.  Intake/Output from previous day: 07/06 0701 - 07/07 0700 In: 2615.3 [I.V.:2365.3; IV Piggyback:250] Out: 2400 [Urine:2400] Intake/Output this shift: No intake/output data recorded.  Lab Results: Recent Labs    12/24/23 0329 12/25/23 0346  WBC 4.6 4.5  HGB 8.5* 8.5*  HCT 27.7* 26.8*  PLT 200 187   BMET Recent Labs    12/24/23 0329 12/25/23 0346 12/26/23 0343  NA 138 138 138  K 3.1* 3.1* 3.8  CL 106 108 110  CO2 22 20* 19*  GLUCOSE 101* 102* 83  BUN 19 10 6   CREATININE 0.88 1.00 0.86  CALCIUM  7.5* 8.0* 7.3*   LFT Recent Labs    12/26/23 0343  PROT 6.5  ALBUMIN 3.0*  AST 40  ALT 95*  ALKPHOS 291*  BILITOT 0.7   PT/INR No results for input(s): LABPROT, INR in the last 72 hours. Hepatitis Panel No results for input(s): HEPBSAG, HCVAB, HEPAIGM, HEPBIGM in the last 72 hours.  DG Abd Portable 1V Result  Date: 12/24/2023 CLINICAL DATA:  Intractable nausea, vomiting EXAM: PORTABLE ABDOMEN - 1 VIEW COMPARISON:  12/12/2023.  CT 12/22/2023 FINDINGS: The bowel gas pattern is normal. No radio-opaque calculi or other significant radiographic abnormality are seen. IMPRESSION: Negative. Electronically Signed   By: Franky Crease M.D.   On: 12/24/2023 19:26   Patient Profile: 53 y.o. 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 intake and upper abdominal pain with weight loss with MRI showing a masslike appearance of the pancreatic head.  Assessment / Plan:  Concern for pancreatic head mass/upper abdominal pain with nausea vomiting and elevated liver enzymes --nausea vomiting has continued.  This may be related to stenosis and second portion of duodenum but also possibly pancreatic head mass. 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 Total bili 0.5 -> 0.7. Alk phos 285 -> 291. AST 51 -> 40. ALT 117 -> 95.  Hemodynamically stable. -- Proceed with EUS +/- EGD with Dr. Wilhelmenia this afternoon -- NPO -- IV fluids and pain management per the hospitalist -- Continue Prochlorperazine  10  mg IV every 6 hours -- Continue Ondansetron  4 mg IV every 6 hours and Reglan  5 mg IV every 6 hours as needed -- Trend liver enzymes  Microcytic anemia. Hg 8.5.  No overt GI bleeding. -- AM CBC was previously ordered, not done. RN to contact lab to draw CBC.    Recent diagnosis of esophagitis and candidiasis -- Continue PPI IV  bid and fluconazole  therapy   Hypokalemia and hypomagnesemia --potassium and magnesium  need to be replaced; also important to have normalization prior to anesthesia for EUS today. Today K+ 3.8 and Magnesium  level 1.7.  -- Replacement per primary team  Principal Problem:   AKI (acute kidney injury) (HCC) Active Problems:   Hypokalemia   HLD (hyperlipidemia)   Breast cancer metastasized to bone, right (HCC)   Constipation   Acute kidney injury superimposed on chronic kidney disease (HCC)   Dehydration   Prolonged QT interval   Hypocalcemia   Acute esophagitis   Candida esophagitis (HCC)   Transaminitis   Pancreatic mass   Right sided weakness   Hypotension   Abnormal CT of the abdomen   Elevated liver enzymes   Acute kidney injury (HCC)     LOS: 3 days   Elida HERO Kennedy-Smith  12/26/2023, 12:13 PM

## 2023-12-26 NOTE — Op Note (Signed)
 [facility] Patient Name: Chelsea Hansen Procedure Date: 12/26/2023 MRN: [id number] Attending MD: Aloha Finner , MD, [id number] Date of Birth: 1971/04/22 CSN: [id number] Age: 53 Admit Type: Inpatient Procedure:                Upper EUS Indications:              Suspected mass in pancreas on MRCP, Epigastric                            abdominal pain, Failure to thrive, Stenosis of the                            duodenum, Follow-up of duodenal stenosis, Nausea                            with vomiting Providers:                Aloha Finner, MD, [name] Penner, RN, Fairy [place], Technician Referring MD:              Medicines:                Monitored Anesthesia Care Complications:            No immediate complications. Estimated Blood Loss:     Estimated blood loss was minimal. Procedure:                Pre-Anesthesia Assessment:                           - Prior to the procedure, a History and Physical                            was performed, and patient medications and                            allergies were reviewed. The patient's tolerance of                            previous anesthesia was also reviewed. The risks                            and benefits of the procedure and the sedation                            options and risks were discussed with the patient.                            All questions were answered, and informed consent                            was obtained. Prior Anticoagulants: The patient has                            taken  no anticoagulant or antiplatelet agents. ASA                            Grade Assessment: III - A patient with severe                            systemic disease. After reviewing the risks and                            benefits, the patient was deemed in satisfactory                            condition to undergo the procedure.                           After obtaining  informed consent, the endoscope was                            passed under direct vision. Throughout the                            procedure, the patient's blood pressure, pulse, and                            oxygen saturations were monitored continuously. The                            [id number]-H190 ([id number]) Olympus endoscope was introduced                            through the mouth, and advanced to the duodenal                            bulb. The GF-UCT180 ([id number]) Olympus linear                            ultrasound scope was introduced through the mouth,                            and advanced to the duodenum for ultrasound                            examination from the stomach and duodenum. The                            upper EUS was accomplished without difficulty. The                            patient tolerated the procedure. The PCF-H190TL                            ([id number]) Olympus slim colonoscope was introduced  through the mouth, and advanced to the duodenal                            bulb. Scope In: Scope Out: Findings:      ENDOSCOPIC FINDING: :      No gross lesions were noted in the proximal esophagus.      LA Grade C (one or more mucosal breaks continuous between tops of 2 or       more mucosal folds, less than 75% circumference) esophagitis with no       bleeding was found in the distal esophagus. This appears to be improved       compared to prior described esophageal findings.      The Z-line was irregular and was found 39 cm from the incisors.      A 3 cm hiatal hernia was present.      Retained fluid was found in the entire examined stomach. Suction via       Endoscope was performed for 900 cc of fluid and some small remnant solid       debris.      A significant J-shaped and dilation deformity was found in the entire       examined stomach.      Patchy severely erythematous mucosa without bleeding was found in the       entire  examined stomach.      No gross lesions were noted in the duodenal bulb.      An acquired benign-appearing, intrinsic severe stenosis was found in the       duodenal angle/sweep and was non-traversed initially with the adult       endoscope as a result of no further scope length available (this was not       able to be traversed with the ultraslim colonoscope either after       transition to that scope). Biopsies were taken with a cold forceps for       histology.      ENDOSONOGRAPHIC FINDING: :      Pancreatic parenchymal abnormalities were noted in the pancreatic head,       genu of the pancreas, pancreatic body and pancreatic tail. These       consisted of diffuse echogenicity and lobularity without honeycombing.       Overt mass/lesion however was not noted in this particular regions that       were identified (uncinate process could not be visualized).      The pancreatic duct had a prominent endosonographic appearance in the       pancreatic head (2.9 mm), and was dilated within the genu of the       pancreas (4.3 mm), body of the pancreas (3.1 mm) and tail of the       pancreas (1.6 mm). Again as documented above, the uncinate process       pancreas duct could not be visualized since this area could not be       reached.      There was a normal-appearing common bile duct (2.9 -> 5.4 mm) and       prominence in the common hepatic duct (7.9 mm).      Endosonographic imaging of the superior aspect of the ampulla showed no       intramural (subepithelial) lesion (caveat that I cannot go into station       4 to evaluate  this fully as a result of the duodenal stenosis).      No malignant-appearing lymph nodes were visualized in the celiac region       (level 20), peripancreatic region and porta hepatis region.      Endosonographic imaging in the visualized portion of the liver showed no       mass.      The celiac region was visualized. Impression:               EGD impression:                            - No gross lesions in the proximal esophagus.                           - LA Grade C esophagitis with no bleeding found                            distally (appears improved from prior).                           - Z-line irregular, 39 cm from the incisors.                           - 3 cm hiatal hernia.                           - Retained gastric fluid. 900 cc removed on today's                            procedure.                           - J-shaped and dilation deformity in the entire                            stomach.                           - Erythematous mucosa in the stomach. Previously                            biopsied and negative for HP.                           - No gross lesions in the duodenal bulb.                           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 Biopsied.  EUS impression:                           - Pancreatic parenchymal abnormalities consisting                            of diffuse echogenicity and lobularity were noted                            in the pancreatic head, genu of the pancreas,                            pancreatic body and pancreatic tail. No overt                            mass/lesion was noted in this particular regions                            visualized. Uncinate process not able to be                            visualized.                           - The pancreatic duct within the head was prominent                            and was dilated in the genu of the pancreas, body                            of the pancreas and tail of the pancreas.                           - There was a normal-appearing common bile duct and                            prominent/slightly dilated common hepatic duct.                           - No malignant-appearing lymph  nodes were                            visualized in the celiac region (level 20),                            peripancreatic region and porta hepatis region. Moderate Sedation:      Not Applicable - Patient had care per Anesthesia. Recommendation:           - The patient will be observed post-procedure,                            until all discharge criteria are met.                           - Return  patient to hospital ward for ongoing care.                           - NPO with no more than ice chips.                           - Observe patient's clinical course.                           - Repeat attempt at EUS/Enteroscopy with                            Fluoroscopy for dilation of the duodenal stricture                            to see if we can traverse the area into the D2                            region.                           - If not successful, patient will need to be                            evaluated by General Surgery to consider possible                            gastric bypass and diagnostic laparoscopy to                            evaluate and biopsy the pancreatic head if this                            persists. Query if this could be a groove                            pancreatitis vs as the imaging is concerning                            (pending CA19-9) a more malignant process.                           - The findings and recommendations were discussed                            with the patient.                           - The findings and recommendations were discussed                            with the referring physician. Procedure Code(s):        --- Professional ---  56762, Esophagogastroduodenoscopy, flexible,                            transoral; with endoscopic ultrasound examination                            limited to the esophagus, stomach or duodenum, and                            adjacent structures                            43239, Esophagogastroduodenoscopy, flexible,                            transoral; with biopsy, single or multiple Diagnosis Code(s):        --- Professional ---                           K20.90, Esophagitis, unspecified without bleeding                           K22.89, Other specified disease of esophagus                           K44.9, Diaphragmatic hernia without obstruction or                            gangrene                           K31.89, Other diseases of stomach and duodenum                           K31.5, Obstruction of duodenum                           K86.9, Disease of pancreas, unspecified                           R93.3, Abnormal findings on diagnostic imaging of                            other parts of digestive tract                           I89.9, Noninfective disorder of lymphatic vessels                            and lymph nodes, unspecified                           R10.13, Epigastric pain                           R62.7, Adult failure to thrive  R11.2, Nausea with vomiting, unspecified                           K83.8, Other specified diseases of biliary tract CPT copyright 2022 [employer]. All rights reserved. The codes documented in this report are preliminary and upon coder review may  be revised to meet current compliance requirements. Aloha Finner, MD 12/26/2023 5:08:06 PM Number of Addenda: 0

## 2023-12-26 NOTE — Anesthesia Postprocedure Evaluation (Signed)
 Anesthesia Post Note  Patient: Chelsea Hansen  Procedure(s) Performed: ULTRASOUND, UPPER GI TRACT, ENDOSCOPIC EGD (ESOPHAGOGASTRODUODENOSCOPY)     Patient location during evaluation: PACU Anesthesia Type: MAC Level of consciousness: awake and alert Pain management: pain level controlled Vital Signs Assessment: post-procedure vital signs reviewed and stable Respiratory status: spontaneous breathing, nonlabored ventilation, respiratory function stable and patient connected to nasal cannula oxygen Cardiovascular status: stable and blood pressure returned to baseline Postop Assessment: no apparent nausea or vomiting Anesthetic complications: no   No notable events documented.  Last Vitals:  Vitals:   12/26/23 1720 12/26/23 1744  BP: 113/73 105/65  Pulse: 70 64  Resp: 14 18  Temp:  36.6 C  SpO2: 100% 100%    Last Pain:  Vitals:   12/26/23 1744  TempSrc: Oral  PainSc:                  Maurisha Mongeau E

## 2023-12-26 NOTE — Transfer of Care (Signed)
 Immediate Anesthesia Transfer of Care Note  Patient: KANON COLUNGA  Procedure(s) Performed: ULTRASOUND, UPPER GI TRACT, ENDOSCOPIC EGD (ESOPHAGOGASTRODUODENOSCOPY)  Patient Location: PACU  Anesthesia Type:MAC  Level of Consciousness: drowsy  Airway & Oxygen Therapy: Patient Spontanous Breathing and Patient connected to face mask oxygen  Post-op Assessment: Report given to RN and Post -op Vital signs reviewed and stable  Post vital signs: Reviewed and stable  Last Vitals:  Vitals Value Taken Time  BP 82/54 12/26/23 16:45  Temp    Pulse 71 12/26/23 16:49  Resp 25 12/26/23 16:49  SpO2 100 % 12/26/23 16:49  Vitals shown include unfiled device data.  Last Pain:  Vitals:   12/26/23 1435  TempSrc: Temporal  PainSc: 0-No pain      Patients Stated Pain Goal: 4 (12/26/23 1435)  Complications: No notable events documented.

## 2023-12-26 NOTE — Interval H&P Note (Signed)
 History and Physical Interval Note:  12/26/2023 2:22 PM  [name]  has presented today for surgery, with the diagnosis of Pancreatic mass and elevated liver enzymes.  The various methods of treatment have been discussed with the patient and family. After consideration of risks, benefits and other options for treatment, the patient has consented to  Procedure(s): ULTRASOUND, UPPER GI TRACT, ENDOSCOPIC (N/A) as a surgical intervention.  The patient's history has been reviewed, patient examined, no change in status, stable for surgery.  I have reviewed the patient's chart and labs.  Questions were answered to the patient's satisfaction.     The risks of an EUS including intestinal perforation, bleeding, infection, aspiration, and medication effects were discussed as was the possibility it may not give a definitive diagnosis if a biopsy is performed.  When a biopsy of the pancreas is done as part of the EUS, there is an additional risk of pancreatitis at the rate of about 1-2%.  It was explained that procedure related pancreatitis is typically mild, although it can be severe and even life threatening, which is why we do not perform random pancreatic biopsies and only biopsy a lesion/area we feel is concerning enough to warrant the risk.    Chelsea Hansen

## 2023-12-26 NOTE — H&P (View-Only) (Signed)
 [facility] Progress Note  CC:  N/V, pancreatic mass   Subjective: She remains NPO for EUS this afternoon.  She continues to have nausea and vomiting despite scheduled Compazine  and ondansetron /Reglan  as needed.  She continues to have generalized abdominal pain more pronounced to the upper abdomen.  No bloody or black stools.  No chest pain or shortness of breath.   Objective:  Vital signs in last 24 hours: Temp:  [98.4 F (36.9 C)-98.6 F (37 C)] 98.6 F (37 C) (07/07 0531) Pulse Rate:  [65-78] 78 (07/07 0531) Resp:  [14-20] 20 (07/07 0531) BP: (95-122)/(62-83) 95/62 (07/07 0531) SpO2:  [97 %-100 %] 97 % (07/07 0531)   General: Fatigued appearing 53 year old female in no acute distress. Heart: No murmurs. Pulm: Sounds clear throughout. Abdomen: Generalized tenderness, upper abdomen > lower abdomen, without rebound or guarding.  Positive bowel sounds to all 4 quadrants. Extremities:  No edema. Neurologic:  Alert and  oriented x 4. Grossly normal neurologically. Psych:  Alert and cooperative. Normal mood and affect.  Intake/Output from previous day: 07/06 0701 - 07/07 0700 In: 2615.3 [I.V.:2365.3; IV Piggyback:250] Out: 2400 [Urine:2400] Intake/Output this shift: No intake/output data recorded.  Lab Results: Recent Labs    12/24/23 0329 12/25/23 0346  WBC 4.6 4.5  HGB 8.5* 8.5*  HCT 27.7* 26.8*  PLT 200 187   BMET Recent Labs    12/24/23 0329 12/25/23 0346 12/26/23 0343  NA 138 138 138  K 3.1* 3.1* 3.8  CL 106 108 110  CO2 22 20* 19*  GLUCOSE 101* 102* 83  BUN 19 10 6   CREATININE 0.88 1.00 0.86  CALCIUM  7.5* 8.0* 7.3*   LFT Recent Labs    12/26/23 0343  PROT 6.5  ALBUMIN 3.0*  AST 40  ALT 95*  ALKPHOS 291*  BILITOT 0.7   PT/INR No results for input(s): LABPROT, INR in the last 72 hours. Hepatitis Panel No results for input(s): HEPBSAG, HCVAB, HEPAIGM, HEPBIGM in the last 72 hours.  DG Abd Portable 1V Result  Date: 12/24/2023 CLINICAL DATA:  Intractable nausea, vomiting EXAM: PORTABLE ABDOMEN - 1 VIEW COMPARISON:  12/12/2023.  CT 12/22/2023 FINDINGS: The bowel gas pattern is normal. No radio-opaque calculi or other significant radiographic abnormality are seen. IMPRESSION: Negative. Electronically Signed   By: Franky Crease M.D.   On: 12/24/2023 19:26   Patient Profile: 53 y.o. 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 intake and upper abdominal pain with weight loss with MRI showing a masslike appearance of the pancreatic head.  Assessment / Plan:  Concern for pancreatic head mass/upper abdominal pain with nausea vomiting and elevated liver enzymes --nausea vomiting has continued.  This may be related to stenosis and second portion of duodenum but also possibly pancreatic head mass. 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 Total bili 0.5 -> 0.7. Alk phos 285 -> 291. AST 51 -> 40. ALT 117 -> 95.  Hemodynamically stable. -- Proceed with EUS +/- EGD with Dr. Wilhelmenia this afternoon -- NPO -- IV fluids and pain management per the hospitalist -- Continue Prochlorperazine  10  mg IV every 6 hours -- Continue Ondansetron  4 mg IV every 6 hours and Reglan  5 mg IV every 6 hours as needed -- Trend liver enzymes  Microcytic anemia. Hg 8.5.  No overt GI bleeding. -- AM CBC was previously ordered, not done. RN to contact lab to draw CBC.    Recent diagnosis of esophagitis and candidiasis -- Continue PPI IV  bid and fluconazole  therapy   Hypokalemia and hypomagnesemia --potassium and magnesium  need to be replaced; also important to have normalization prior to anesthesia for EUS today. Today K+ 3.8 and Magnesium  level 1.7.  -- Replacement per primary team  Principal Problem:   AKI (acute kidney injury) (HCC) Active Problems:   Hypokalemia   HLD (hyperlipidemia)   Breast cancer metastasized to bone, right (HCC)   Constipation   Acute kidney injury superimposed on chronic kidney disease (HCC)   Dehydration   Prolonged QT interval   Hypocalcemia   Acute esophagitis   Candida esophagitis (HCC)   Transaminitis   Pancreatic mass   Right sided weakness   Hypotension   Abnormal CT of the abdomen   Elevated liver enzymes   Acute kidney injury (HCC)     LOS: 3 days   Elida HERO Kennedy-Smith  12/26/2023, 12:13 PM

## 2023-12-26 NOTE — Progress Notes (Signed)
   12/26/23 1105  Spiritual Encounters  Type of Visit Initial  Care provided to: Patient  Referral source Physician  Reason for visit Routine spiritual support   Per spiritual consult request by Dr. Briana, I visted with Ms. Shona Gainer to offer support.  Ms. Majkowski conveyed feeling frustrated with persisting nausea and desire to resolve her diagnosis and care plan. She expressed that she had good family support and that they visited over weekend. She also shared her [place]le[place] faith as meaningful and a source of strength.  I provided compassionate presence and active listening. I offered validation for Ms. Doberstein's feelings and took her concerns seriously. I shared with her the contact number for [facility] Patient Experience. I encouraged trust in her care team while also encouraging her self-advocacy. I affirmed her faith and let her know spiritual care support remains available.  I spoke with her RNs to share this as I concluded visit.  Makayleigh Poliquin L. Fredrica, M.Div [phone]

## 2023-12-26 NOTE — Progress Notes (Signed)
 PROGRESS NOTE    Chelsea Hansen  FMW:[id number] DOB: 08-21-1970 DOA: 12/22/2023 PCP: Chelsea Rosaline SQUIBB, NP   Brief Narrative: Chelsea Hansen is a 53 y.o. female with a history of metastatic breast cancer, duodenal stenosis, esophageal candidiasis, constipation, hyperlipidemia, hypertension, anemia, DVT, asthma.  Patient presented secondary to abdominal pain and recurrent emesis with concern for possible biliary process and with associated AKI.  Imaging concerning for possible pancreatic lesion in addition to possible gallbladder pathology.  Patient started on IV fluids.  Gastroenterology consulted for recommendations. MRCP ordered and with findings concerning for primary pancreatic adenocarcinoma. Plan for EUS.   Assessment and Plan:  AKI on CKD IIIa Secondary to intractable nausea and vomiting with resultant hypovolemia. Baseline creatinine of about 1. Creatinine of 3.25 on admission. Patient started on IV fluids. - Continue IV fluids - Daily BMP  Nausea and Vomiting Present on admission.  Unclear etiology. History of candida esophagitis on fluconazole . Having bowel movements. Abdominal x-ray without evidence of obstruction. Still with recurrent emesis with scheduled compazine  - Continue Compazine  scheduled - Start Ativan  scheduled - Continue Zofran  and Reglan  as needed  Hypokalemia Secondary to GI losses from recurrent emesis. - Potassium supplementation as needed  Elevated AST/ALT Elevated Lipase Pancreatic mass CT chest/abdomen/pelvis and right upper quadrant ultrasound obtained on admission and significant for evidence of stranding of the proximal pancreas with possible underlying vague mass, borderline gallbladder wall thickening, with no evidence of cholelithiasis or pericholecystic fluid.  Gastroenterology was consulted on admission.  LFTs trending down. MRCP ordered and concerning for pancreatic adenocarcinoma. - Gastroenterology recommendations: plan for EUS today,  7/7  Candida esophagitis Patient diagnosed on 6/24. Patient has been on fluconazole  as an outpatient, but with recurrent emesis, has not received consistent dosing. Patient started on fluconazole . - Continue fluconazole  per GI recommendations  Hypocalcemia Low, even accounting for albumin. Calcium  gluconate given with improvement. Back down today. - Calcium  gluconate  Hypomagnesemia Secondary to GI losses. Improved with magnesium  supplementation. - Continue magnesium  supplementation IV  Hypophosphatemia - Phosphorus supplementation  Acute on chronic anemia Baseline hemoglobin appears to be around 9-10. Hemoglobin normal at 12.7 on admission. This may be possibly related to hemoconcentration as she has drifted down towards baseline.  Hemoglobin down to 8.5 now. No episodes of bleeding noted. Hemoglobin stable today. - CBC in AM  Constipation - Continue MiraLAX   History of breast cancer Metastasis to bone Patient follows with Dr. [name] as an outpatient and is on anastrazole. She was previously on abemaciclib , which has been held.  CT this admission confirms metastatic osseous lesions.  Hyperlipidemia Noted. Patient is not on statin therapy as an outpatient.  Chronic pain Patient is on Percocet and gabapentin  as an outpatient. - Morphine  IV as needed while still having recurrent vomiting   DVT prophylaxis: SCDs Code Status:   Code Status: Full Code Family Communication: None at bedside. Disposition Plan: Discharge pending transition off IV fluids, ability to advance diet, ongoing GI recommendations   Consultants:  Gastroenterology  Procedures:  None  Antimicrobials: None    Subjective: Still vomiting. Frustrated about many things.  Objective: BP 95/62 (BP Location: Left Arm)   Pulse 78   Temp 98.6 F (37 C)   Resp 20   Ht 5' 11 (1.803 m)   Wt 70.4 kg   SpO2 97%   BMI 21.65 kg/m   Examination:  General exam: Appears calm and comfortable Respiratory  system: Clear to auscultation. Respiratory effort normal. Cardiovascular system: S1 & S2 heard,  RRR. No murmur. Gastrointestinal system: Abdomen is nondistended, soft and nontender. Normal bowel sounds heard. Central nervous system: Alert and oriented. No focal neurological deficits. Musculoskeletal: No edema. No calf tenderness Psychiatry: Judgement and insight appear normal. Mood & affect appropriate.    Data Reviewed: I have personally reviewed following labs and imaging studies  CBC Lab Results  Component Value Date   WBC 4.5 12/25/2023   RBC 3.61 (L) 12/25/2023   HGB 8.5 (L) 12/25/2023   HCT 26.8 (L) 12/25/2023   MCV 74.2 (L) 12/25/2023   MCH 23.5 (L) 12/25/2023   PLT 187 12/25/2023   MCHC 31.7 12/25/2023   RDW 15.0 12/25/2023   LYMPHSABS 1.0 12/22/2023   MONOABS 0.5 12/22/2023   EOSABS 0.0 12/22/2023   BASOSABS 0.0 12/22/2023     Last metabolic panel Lab Results  Component Value Date   NA 138 12/26/2023   K 3.8 12/26/2023   CL 110 12/26/2023   CO2 19 (L) 12/26/2023   BUN 6 12/26/2023   CREATININE 0.86 12/26/2023   GLUCOSE 83 12/26/2023   GFRNONAA >60 12/26/2023   GFRAA >60 09/07/2019   CALCIUM  7.3 (L) 12/26/2023   PHOS 1.7 (L) 12/26/2023   PROT 6.5 12/26/2023   ALBUMIN 3.0 (L) 12/26/2023   LABGLOB 2.8 11/30/2023   AGRATIO 1.5 10/06/2020   BILITOT 0.7 12/26/2023   ALKPHOS 291 (H) 12/26/2023   AST 40 12/26/2023   ALT 95 (H) 12/26/2023   ANIONGAP 9 12/26/2023    GFR: Estimated Creatinine Clearance: 86 mL/min (by C-G formula based on SCr of 0.86 mg/dL).  No results found for this or any previous visit (from the past 240 hours).    Radiology Studies: DG Abd Portable 1V Result Date: 12/24/2023 CLINICAL DATA:  Intractable nausea, vomiting EXAM: PORTABLE ABDOMEN - 1 VIEW COMPARISON:  12/12/2023.  CT 12/22/2023 FINDINGS: The bowel gas pattern is normal. No radio-opaque calculi or other significant radiographic abnormality are seen. IMPRESSION: Negative.  Electronically Signed   By: Franky Crease M.D.   On: 12/24/2023 19:26      LOS: 3 days    Elgin Lam, MD [facility] 12/26/2023, 9:24 AM   If 7PM-7AM, please contact night-coverage [email or web address]

## 2023-12-26 NOTE — Anesthesia Preprocedure Evaluation (Addendum)
 Anesthesia Evaluation  Patient identified by MRN, date of birth, ID band Patient awake    Reviewed: Allergy & Precautions, NPO status , Patient's Chart, lab work & pertinent test results  History of Anesthesia Complications Negative for: history of anesthetic complications  Airway Mallampati: II       Dental  (+) Poor Dentition, Missing, Loose, Chipped,    Pulmonary asthma     + decreased breath sounds      Cardiovascular hypertension, + DVT  Normal cardiovascular exam+ dysrhythmias Supra Ventricular Tachycardia    '22 TTE - EF 60 to 65%. Left atrial size was mildly dilated. Trivial mitral valve regurgitation.     Neuro/Psych  Headaches PSYCHIATRIC DISORDERS Anxiety Depression       GI/Hepatic Neg liver ROS,GERD  Medicated,,  Endo/Other   Ca 7.3   Renal/GU Renal disease     Musculoskeletal negative musculoskeletal ROS (+)    Abdominal   Peds  Hematology  (+) Blood dyscrasia, anemia   Anesthesia Other Findings   Reproductive/Obstetrics  Breast cancer s/p hysterectomy                               Anesthesia Physical Anesthesia Plan  ASA: 3  Anesthesia Plan: MAC   Post-op Pain Management: Minimal or no pain anticipated   Induction:   PONV Risk Score and Plan: 2 and Propofol  infusion and Treatment may vary due to age or medical condition  Airway Management Planned: Nasal Cannula and Natural Airway  Additional Equipment: None  Intra-op Plan:   Post-operative Plan:   Informed Consent: I have reviewed the patients History and Physical, chart, labs and discussed the procedure including the risks, benefits and alternatives for the proposed anesthesia with the patient or authorized representative who has indicated his/her understanding and acceptance.       Plan Discussed with: CRNA and Anesthesiologist  Anesthesia Plan Comments:          Anesthesia Quick  Evaluation

## 2023-12-27 ENCOUNTER — Inpatient Hospital Stay (HOSPITAL_COMMUNITY): Payer: MEDICAID

## 2023-12-27 ENCOUNTER — Inpatient Hospital Stay (HOSPITAL_COMMUNITY): Payer: MEDICAID | Admitting: Certified Registered Nurse Anesthetist

## 2023-12-27 ENCOUNTER — Other Ambulatory Visit: Payer: Self-pay

## 2023-12-27 ENCOUNTER — Encounter (HOSPITAL_COMMUNITY): Admission: EM | Discharge status: Against Medical Advice | Payer: Self-pay | Attending: Internal Medicine

## 2023-12-27 ENCOUNTER — Encounter (HOSPITAL_COMMUNITY): Payer: Self-pay | Admitting: Family Medicine

## 2023-12-27 DIAGNOSIS — K449 Diaphragmatic hernia without obstruction or gangrene: Secondary | ICD-10-CM | POA: Diagnosis not present

## 2023-12-27 DIAGNOSIS — K222 Esophageal obstruction: Secondary | ICD-10-CM | POA: Diagnosis not present

## 2023-12-27 DIAGNOSIS — C50911 Malignant neoplasm of unspecified site of right female breast: Secondary | ICD-10-CM | POA: Diagnosis not present

## 2023-12-27 DIAGNOSIS — K209 Esophagitis, unspecified without bleeding: Secondary | ICD-10-CM | POA: Diagnosis not present

## 2023-12-27 DIAGNOSIS — R748 Abnormal levels of other serum enzymes: Secondary | ICD-10-CM | POA: Diagnosis not present

## 2023-12-27 DIAGNOSIS — K8689 Other specified diseases of pancreas: Secondary | ICD-10-CM | POA: Diagnosis not present

## 2023-12-27 HISTORY — PX: EUS: SHX5427

## 2023-12-27 HISTORY — PX: ESOPHAGOGASTRODUODENOSCOPY: SHX5428

## 2023-12-27 LAB — COMPREHENSIVE METABOLIC PANEL WITH GFR
ALT: 73 U/L — ABNORMAL HIGH (ref 0–44)
AST: 32 U/L (ref 15–41)
Albumin: 2.9 g/dL — ABNORMAL LOW (ref 3.5–5.0)
Alkaline Phosphatase: 289 U/L — ABNORMAL HIGH (ref 38–126)
Anion gap: 10 (ref 5–15)
BUN: 5 mg/dL — ABNORMAL LOW (ref 6–20)
CO2: 18 mmol/L — ABNORMAL LOW (ref 22–32)
Calcium: 6.9 mg/dL — ABNORMAL LOW (ref 8.9–10.3)
Chloride: 111 mmol/L (ref 98–111)
Creatinine, Ser: 0.74 mg/dL (ref 0.44–1.00)
GFR, Estimated: >60 mL/min (ref >60)
Glucose, Bld: 72 mg/dL (ref 70–99)
Potassium: 3.3 mmol/L — ABNORMAL LOW (ref 3.5–5.1)
Sodium: 139 mmol/L (ref 135–145)
Total Bilirubin: 0.9 mg/dL (ref 0.0–1.2)
Total Protein: 6.2 g/dL — ABNORMAL LOW (ref 6.5–8.1)

## 2023-12-27 LAB — PREALBUMIN: Prealbumin: 13 mg/dL — ABNORMAL LOW (ref 18–38)

## 2023-12-27 SURGERY — EGD (ESOPHAGOGASTRODUODENOSCOPY)
Anesthesia: General

## 2023-12-27 MED ORDER — SUCCINYLCHOLINE CHLORIDE 200 MG/10ML IV SOSY
PREFILLED_SYRINGE | INTRAVENOUS | Status: DC | PRN
  Administered 2023-12-27: 100 mg via INTRAVENOUS

## 2023-12-27 MED ORDER — POTASSIUM CHLORIDE 10 MEQ/100ML IV SOLN
10.0000 meq | INTRAVENOUS | Status: AC
  Administered 2023-12-27 (×4): 10 meq via INTRAVENOUS
  Filled 2023-12-27 (×4): qty 100

## 2023-12-27 MED ORDER — FENTANYL CITRATE (PF) 100 MCG/2ML IJ SOLN
INTRAMUSCULAR | Status: AC
  Filled 2023-12-27: qty 2

## 2023-12-27 MED ORDER — FENTANYL CITRATE (PF) 100 MCG/2ML IJ SOLN
INTRAMUSCULAR | Status: DC | PRN
  Administered 2023-12-27 (×2): 50 ug via INTRAVENOUS

## 2023-12-27 MED ORDER — LACTATED RINGERS IV SOLN
INTRAVENOUS | Status: AC | PRN
  Administered 2023-12-27: 1000 mL via INTRAVENOUS

## 2023-12-27 MED ORDER — SUCRALFATE 1 GM/10ML PO SUSP
1.0000 g | Freq: Four times a day (QID) | ORAL | Status: DC
  Administered 2023-12-27 – 2024-01-02 (×4): 1 g via ORAL
  Filled 2023-12-27 (×6): qty 10

## 2023-12-27 MED ORDER — ROCURONIUM BROMIDE 10 MG/ML (PF) SYRINGE
PREFILLED_SYRINGE | INTRAVENOUS | Status: DC | PRN
  Administered 2023-12-27: 10 mg via INTRAVENOUS

## 2023-12-27 MED ORDER — DEXAMETHASONE SODIUM PHOSPHATE 10 MG/ML IJ SOLN
INTRAMUSCULAR | Status: DC | PRN
  Administered 2023-12-27: 10 mg via INTRAVENOUS

## 2023-12-27 MED ORDER — PROPOFOL 10 MG/ML IV BOLUS
INTRAVENOUS | Status: DC | PRN
  Administered 2023-12-27: 130 mg via INTRAVENOUS
  Administered 2023-12-27: 50 mg via INTRAVENOUS

## 2023-12-27 MED ORDER — PHENYLEPHRINE 80 MCG/ML (10ML) SYRINGE FOR IV PUSH (FOR BLOOD PRESSURE SUPPORT)
PREFILLED_SYRINGE | INTRAVENOUS | Status: DC | PRN
  Administered 2023-12-27: 160 ug via INTRAVENOUS
  Administered 2023-12-27: 80 ug via INTRAVENOUS
  Administered 2023-12-27: 120 ug via INTRAVENOUS
  Administered 2023-12-27 (×3): 160 ug via INTRAVENOUS
  Administered 2023-12-27: 120 ug via INTRAVENOUS

## 2023-12-27 MED ORDER — LORAZEPAM 2 MG/ML IJ SOLN
0.5000 mg | Freq: Once | INTRAMUSCULAR | Status: AC | PRN
  Administered 2023-12-27: 0.5 mg via INTRAVENOUS
  Filled 2023-12-27: qty 1

## 2023-12-27 MED ORDER — SODIUM CHLORIDE 0.9 % IV SOLN
4.0000 g | Freq: Once | INTRAVENOUS | Status: AC
  Administered 2023-12-27: 4 g via INTRAVENOUS
  Filled 2023-12-27: qty 40

## 2023-12-27 MED ORDER — GLUCAGON HCL RDNA (DIAGNOSTIC) 1 MG IJ SOLR
INTRAMUSCULAR | Status: AC
  Filled 2023-12-27: qty 1

## 2023-12-27 MED ORDER — LIDOCAINE 2% (20 MG/ML) 5 ML SYRINGE
INTRAMUSCULAR | Status: DC | PRN
  Administered 2023-12-27: 100 mg via INTRAVENOUS

## 2023-12-27 MED ORDER — CIPROFLOXACIN IN D5W 400 MG/200ML IV SOLN
INTRAVENOUS | Status: AC
  Filled 2023-12-27: qty 200

## 2023-12-27 NOTE — TOC CM/SW Note (Signed)
 Transition of Care [facility]) - Inpatient Brief Assessment   Patient Details  Name: Chelsea Hansen MRN: [id number] Date of Birth: 1970-11-17  Transition of [facility]) CM/SW Contact:    Tawni CHRISTELLA Eva, LCSW Phone Number: 12/27/2023, 11:01 AM   Clinical Narrative:   Transition of Care Asessment: Insurance and Status: Insurance coverage has been reviewed Patient has primary care physician: Yes Home environment has been reviewed: home with daughter Prior level of function:: mod independent Prior/Current Home Services: No current home services Social Drivers of Health Review: SDOH reviewed no interventions necessary Readmission risk has been reviewed: Yes Transition of care needs: no transition of care needs at this time

## 2023-12-27 NOTE — Anesthesia Preprocedure Evaluation (Addendum)
 Anesthesia Evaluation  Patient identified by MRN, date of birth, ID band Patient awake    Reviewed: Allergy & Precautions, NPO status , Patient's Chart, lab work & pertinent test results  History of Anesthesia Complications Negative for: history of anesthetic complications  Airway Mallampati: II  TM Distance: >3 FB Neck ROM: Full    Dental  (+) Missing,    Pulmonary asthma    Pulmonary exam normal        Cardiovascular hypertension, + DVT (2010)  Normal cardiovascular exam+ dysrhythmias Supra Ventricular Tachycardia      Neuro/Psych  Headaches  Anxiety Depression       GI/Hepatic Neg liver ROS,GERD  ,,  Endo/Other  negative endocrine ROS    Renal/GU negative Renal ROS     Musculoskeletal negative musculoskeletal ROS (+)    Abdominal   Peds  Hematology negative hematology ROS (+)   Anesthesia Other Findings Day of surgery medications reviewed with patient.  Reproductive/Obstetrics                              Anesthesia Physical Anesthesia Plan  ASA: 2  Anesthesia Plan: General   Post-op Pain Management: Minimal or no pain anticipated   Induction: Intravenous and Rapid sequence  PONV Risk Score and Plan: 3 and Treatment may vary due to age or medical condition, Ondansetron , Dexamethasone  and Midazolam   Airway Management Planned: Oral ETT  Additional Equipment: None  Intra-op Plan:   Post-operative Plan: Extubation in OR  Informed Consent: I have reviewed the patients History and Physical, chart, labs and discussed the procedure including the risks, benefits and alternatives for the proposed anesthesia with the patient or authorized representative who has indicated his/her understanding and acceptance.     Dental advisory given  Plan Discussed with: CRNA  Anesthesia Plan Comments: (Large amount of bilious material in stomach during EGD yesterday. Plan for GETA/RSI.  [place], MD)        Anesthesia Quick Evaluation

## 2023-12-27 NOTE — Progress Notes (Signed)
 PROGRESS NOTE    Chelsea Hansen  FMW:[id number] DOB: Jun 25, 1970 DOA: 12/22/2023 PCP: [name], NP   Brief Narrative: Chelsea Hansen is a 53 y.o. female with a history of metastatic breast cancer, duodenal stenosis, esophageal candidiasis, constipation, hyperlipidemia, hypertension, anemia, DVT, asthma.  Patient presented secondary to abdominal pain and recurrent emesis with concern for possible biliary process and with associated AKI.  Imaging concerning for possible pancreatic lesion in addition to possible gallbladder pathology.  Patient started on IV fluids.  Gastroenterology consulted for recommendations. MRCP ordered and with findings concerning for primary pancreatic adenocarcinoma. EUS attempted on 7/7, but unsuccessful secondary to duodenal stenosis.   Assessment and Plan:  AKI on CKD IIIa Secondary to intractable nausea and vomiting with resultant hypovolemia. Baseline creatinine of about 1. Creatinine of 3.25 on admission. Patient started on IV fluids. - Continue IV fluids - Daily BMP  Nausea and Vomiting Present on admission.  Unclear etiology. History of candida esophagitis on fluconazole . Having bowel movements. Abdominal x-ray without evidence of obstruction. Still with recurrent emesis with scheduled compazine . Endoscopic procedure on 7/7 significant for significant retained gastric fluid with evidence of duodenal stenosis. - Continue Compazine  scheduled - Discontinue Ativan  scheduled - Continue Zofran  and Reglan  as needed - GI recommendations: repeat endoscopy today  Hypokalemia Secondary to GI losses from recurrent emesis. - Potassium supplementation as needed  Elevated AST/ALT Elevated Lipase Pancreatic mass CT chest/abdomen/pelvis and right upper quadrant ultrasound obtained on admission and significant for evidence of stranding of the proximal pancreas with possible underlying vague mass, borderline gallbladder wall thickening, with no evidence of  cholelithiasis or pericholecystic fluid.  Gastroenterology was consulted on admission.  LFTs trending down. MRCP ordered and concerning for pancreatic adenocarcinoma. EUS failed secondary to duodenal stenosis - Gastroenterology recommendations: plan for repeat endoscopy today; if failed, will likely need general surgery consultation  Candida esophagitis Patient diagnosed on 6/24. Patient has been on fluconazole  as an outpatient, but with recurrent emesis, has not received consistent dosing. Patient started on fluconazole . - Continue fluconazole  per GI recommendations  Hypocalcemia Low, even accounting for albumin. Calcium  gluconate given with improvement. Back down today. - Calcium  gluconate  Hypomagnesemia Secondary to GI losses. Improved with magnesium  supplementation. - Continue magnesium  supplementation IV  Hypophosphatemia - Phosphorus supplementation  Acute on chronic anemia Baseline hemoglobin appears to be around 9-10. Hemoglobin normal at 12.7 on admission. This may be possibly related to hemoconcentration as she has drifted down towards baseline.  Hemoglobin down to 8.5 now. No episodes of bleeding noted. Hemoglobin stable today. - CBC in AM  Constipation - Continue MiraLAX   History of breast cancer Metastasis to bone Patient follows with Dr. [name] as an outpatient and is on anastrazole. She was previously on abemaciclib , which has been held.  CT this admission confirms metastatic osseous lesions.  Hyperlipidemia Noted. Patient is not on statin therapy as an outpatient.  Chronic pain Patient is on Percocet and gabapentin  as an outpatient. - Morphine  IV as needed while still having recurrent vomiting   DVT prophylaxis: SCDs Code Status:   Code Status: Full Code Family Communication: Daughter at bedside. Disposition Plan: Discharge pending transition off IV fluids, ability to advance diet, ongoing GI recommendations   Consultants:  Gastroenterology  Procedures:   None  Antimicrobials: None    Subjective: No specific concerns this morning. Awaiting repeat endoscopic procedure.  Objective: BP 119/74   Pulse 89   Temp (!) 97.5 F (36.4 C) (Temporal)   Resp 20  Ht 5' 11 (1.803 m)   Wt 69.6 kg   SpO2 100%   BMI 21.40 kg/m   Examination:  General exam: Appears calm and comfortable Respiratory system: Clear to auscultation. Respiratory effort normal. Cardiovascular system: S1 & S2 heard, RRR. No murmurs, rubs, gallops or clicks. Gastrointestinal system: Abdomen is nondistended, soft and nontender. Normal bowel sounds heard. Central nervous system: Alert and oriented. No focal neurological deficits. Psychiatry: Judgement and insight appear normal. Mood & affect appropriate.    Data Reviewed: I have personally reviewed following labs and imaging studies  CBC Lab Results  Component Value Date   WBC 5.2 12/26/2023   RBC 3.72 (L) 12/26/2023   HGB 8.9 (L) 12/26/2023   HCT 28.4 (L) 12/26/2023   MCV 76.3 (L) 12/26/2023   MCH 23.9 (L) 12/26/2023   PLT 268 12/26/2023   MCHC 31.3 12/26/2023   RDW 15.7 (H) 12/26/2023   LYMPHSABS 1.0 12/22/2023   MONOABS 0.5 12/22/2023   EOSABS 0.0 12/22/2023   BASOSABS 0.0 12/22/2023     Last metabolic panel Lab Results  Component Value Date   NA 139 12/27/2023   K 3.3 (L) 12/27/2023   CL 111 12/27/2023   CO2 18 (L) 12/27/2023   BUN 5 (L) 12/27/2023   CREATININE 0.74 12/27/2023   GLUCOSE 72 12/27/2023   GFRNONAA >60 12/27/2023   GFRAA >60 09/07/2019   CALCIUM  6.9 (L) 12/27/2023   PHOS 1.7 (L) 12/26/2023   PROT 6.2 (L) 12/27/2023   ALBUMIN 2.9 (L) 12/27/2023   LABGLOB 2.8 11/30/2023   AGRATIO 1.5 10/06/2020   BILITOT 0.9 12/27/2023   ALKPHOS 289 (H) 12/27/2023   AST 32 12/27/2023   ALT 73 (H) 12/27/2023   ANIONGAP 10 12/27/2023    GFR: Estimated Creatinine Clearance: 91.4 mL/min (by C-G formula based on SCr of 0.74 mg/dL).  No results found for this or any previous visit (from  the past 240 hours).    Radiology Studies: No results found.     LOS: 4 days    Elgin Lam, MD [facility] 12/27/2023, 1:46 PM   If 7PM-7AM, please contact night-coverage [email or web address]

## 2023-12-27 NOTE — Transfer of Care (Signed)
 Immediate Anesthesia Transfer of Care Note  Patient: Chelsea Hansen  Procedure(s) Performed: Procedure(s): EGD (ESOPHAGOGASTRODUODENOSCOPY) (N/A) ULTRASOUND, UPPER GI TRACT, ENDOSCOPIC (N/A) Balloon dilation wire-guided (N/A)  Patient Location: PACU  Anesthesia Type:General  Level of Consciousness: Patient easily awoken, comfortable, cooperative, following commands, responds to stimulation.   Airway & Oxygen Therapy: Patient spontaneously breathing, ventilating well, oxygen via simple oxygen mask.  Post-op Assessment: Report given to PACU RN, vital signs reviewed and stable, moving all extremities.   Post vital signs: Reviewed and stable.  Complications: No apparent anesthesia complications  Last Vitals:  Vitals Value Taken Time  BP 130/72 12/27/23 14:45  Temp    Pulse 70 12/27/23 14:46  Resp 22 12/27/23 14:46  SpO2 100 % 12/27/23 14:46  Vitals shown include unfiled device data.  Last Pain:  Vitals:   12/27/23 1228  TempSrc: Temporal  PainSc: 0-No pain      Patients Stated Pain Goal: 4 (12/26/23 1435)  Complications: No notable events documented.

## 2023-12-27 NOTE — Interval H&P Note (Signed)
 History and Physical Interval Note:  12/27/2023 1:01 PM  Chelsea Hansen  has presented today for surgery, with the diagnosis of duodenal stricture; needs fluoro.  The various methods of treatment have been discussed with the patient and family. After consideration of risks, benefits and other options for treatment, the patient has consented to  Procedure(s): EGD (ESOPHAGOGASTRODUODENOSCOPY) (N/A) ULTRASOUND, UPPER GI TRACT, ENDOSCOPIC (N/A) Balloon dilation wire-guided (N/A) as a surgical intervention.  The patient's history has been reviewed, patient examined, no change in status, stable for surgery.  I have reviewed the patient's chart and labs.  Questions were answered to the patient's satisfaction.    We will attempt to dilate the duodenal stenosis further to see if we can improve the ability to visualize the inferior HOP/Uncinate process where the imaging has been most concerning.  Increased risks for complications with the significant narrowing present, so will have fluoroscopy to try to ensure we get this as safe as we can completed.  The risks of an EUS including intestinal perforation, bleeding, infection, aspiration, and medication effects were discussed as was the possibility it may not give a definitive diagnosis if a biopsy is performed.  When a biopsy of the pancreas is done as part of the EUS, there is an additional risk of pancreatitis at the rate of about 1-2%.  It was explained that procedure related pancreatitis is typically mild, although it can be severe and even life threatening, which is why we do not perform random pancreatic biopsies and only biopsy a lesion/area we feel is concerning enough to warrant the risk.  If not successful with being able to get EUS scope down, will need to consider surgical consultation and consideration of Gastric GJ bypass as well as diagnostic laparoscopy to evaluate the HOP/Uncinate where there is concern for possible mass/malignancy.    Jaycey Gens  Mansouraty Jr

## 2023-12-27 NOTE — Consult Note (Signed)
 Chelsea Hansen [birth year]  [id number].    Requesting MD: Aloha Wilhelmenia Raddle  Chief Complaint/Reason for Consult: Pancreatic mass with duodenal stenosis   HPI: Chelsea Hansen is a 53 y.o. female with PMH significant for metastatic breast cancer (radiation in 2024, on oral chemotherapy, but off for at least 1 month due to N/V), asthma, DVT after hysterectomy, HTN, HLD, and GERD who has been having about 6 weeks of epigastric abdominal pain, N/V.  She is unable to keep anything down, solid or liquid.  She states that after about 2 hrs it comes back up. She has been voiding less at home and at times her stools have been lighter in color.  Her last BM was Friday after a suppository on Thursday.   She has been admitted several times in the last month for work up. She has a CT scan that reveals thickening around her duodenum as well as a pancreatic uncinate process mass.  She had a PET scan in early June that the thickening was noted, but nothing lit up concerning for malignancy.  Due to persistent symptoms, she underwent an EGD on 6/24 with biopsies of her esophagus, stomach, and duodenum that were negative for malignancy and just revealed inflammatory changes.    During this admit, her AST/ALT/alkphos are still somewhat elevated, but TB is normal.  She underwent attempted EUS today for biopsy of this pancreatic mass.  Unfortunately, her duodenal stricture was unable to be traversed despite attempts at dilatation.  So biopsy was unable to be obtained. She has lost 60# in the last 6 weeks.  Her albumin on her most recent CMET is 2.9. She denies any family history of GI cancers.  Her genetics testing several years ago was negative for any variants.  We have been asked to see her for further evaluation.     ROS: ROS: Please see HPI  Family History  Problem Relation Age of Onset   Breast cancer Mother        18s   Hypertension Father    Uterine cancer Maternal Aunt    Ovarian cancer Maternal  Aunt    Breast cancer Maternal Aunt        3s   Breast cancer Cousin 14   Uterine cancer Cousin 79   Colon polyps Neg Hx    Colon cancer Neg Hx    Esophageal cancer Neg Hx    Stomach cancer Neg Hx     Past Medical History:  Diagnosis Date   Allergy    seasonal allergies   Anxiety    on meds   Asthma    uses inhaler   Breast cancer (HCC) 2012   RIGHT lumpectomy   Cancer (HCC) 2022   RIGHT breast lump-dx 2022   Depression    on meds   DVT (deep venous thrombosis) (HCC) 2010   after hysterectomy   Family history of uterine cancer    GERD (gastroesophageal reflux disease)    with certain foods/OTC PRN meds   Headache(784.0)    History of radiation therapy    Bilateral breast- 09/03/21-11/02/21- Dr. Lynwood Nasuti   Hyperlipidemia    on meds   Hypertension    on meds   SVT (supraventricular tachycardia) (HCC)     Past Surgical History:  Procedure Laterality Date   ABDOMINAL HYSTERECTOMY     AXILLARY SENTINEL NODE BIOPSY Left 07/09/2021   Procedure: LEFT AXILLARY SENTINEL NODE BIOPSY;  Surgeon: [name], MD;  Location: [facility];  Service: General;  Laterality: Left;   BALLOON DILATION N/A 12/13/2023   Procedure: BALLOON DILATION;  Surgeon: Federico Rosario BROCKS, MD;  Location: [facility];  Service: Gastroenterology;  Laterality: N/A;   BIOPSY  06/29/2022   Procedure: BIOPSY;  Surgeon: Legrand Victory LITTIE DOUGLAS, MD;  Location: [facility];  Service: Gastroenterology;;   BIOPSY OF SKIN SUBCUTANEOUS TISSUE AND/OR MUCOUS MEMBRANE  12/13/2023   Procedure: BIOPSY, SKIN, SUBCUTANEOUS TISSUE, OR MUCOUS MEMBRANE;  Surgeon: Federico Rosario BROCKS, MD;  Location: [name];  Service: Gastroenterology;;   BREAST EXCISIONAL BIOPSY Right 09/16/2009   BREAST LUMPECTOMY WITH RADIOACTIVE SEED LOCALIZATION Bilateral 07/09/2021   Procedure: BILATERAL BREAST LUMPECTOMY WITH RADIOACTIVE SEED LOCALIZATION;  Surgeon: [name], MD;  Location: [facility];   Service: General;  Laterality: Bilateral;   BREAST SURGERY     lumpectomy   ESOPHAGOGASTRODUODENOSCOPY N/A 12/13/2023   Procedure: EGD (ESOPHAGOGASTRODUODENOSCOPY);  Surgeon: Federico Rosario BROCKS, MD;  Location: [facility] END[facility];  Service: Gastroenterology;  Laterality: N/A;   ESOPHAGOGASTRODUODENOSCOPY (EGD) WITH PROPOFOL  N/A 06/29/2022   Procedure: ESOPHAGOGASTRODUODENOSCOPY (EGD) WITH PROPOFOL ;  Surgeon: Legrand Victory LITTIE DOUGLAS, MD;  Location: [facility];  Service: Gastroenterology;  Laterality: N/A;   PORT-A-CATH REMOVAL Left 07/09/2021   Procedure: REMOVAL PORT-A-CATH;  Surgeon: [name], MD;  Location: [facility];  Service: General;  Laterality: Left;   PORTACATH PLACEMENT Left 01/26/2021   Procedure: INSERTION PORT-A-CATH;  Surgeon: [name], MD;  Location: [facility];  Service: General;  Laterality: Left;   RADIOACTIVE SEED GUIDED AXILLARY SENTINEL LYMPH NODE Right 07/09/2021   Procedure: RADIOACTIVE SEED GUIDED RIGHT AXILLARY SENTINEL LYMPH NODE DISSECTION;  Surgeon: [name], MD;  Location: [facility];  Service: General;  Laterality: Right;   TUBAL LIGATION      Social History:  reports that she has never smoked. She has never used smokeless tobacco. She reports that she does not currently use alcohol after a past usage of about 7.0 standard drinks of alcohol per week. She reports that she does not use drugs.  Allergies: No Known Allergies  Facility-Administered Medications Prior to Admission  Medication Dose Route Frequency Provider Last Rate Last Admin   sodium chloride  0.9 % bolus 1,000 mL  1,000 mL Intravenous Once [name], NP   Stopped at 12/19/23 1630   Medications Prior to Admission  Medication Sig Dispense Refill   albuterol  (VENTOLIN  HFA) 108 (90 Base) MCG/ACT inhaler Inhale 2 puffs by mouth into the lungs every 6 hours as needed for wheezing or shortness of breath. 18 g 2   anastrozole  (ARIMIDEX ) 1 MG tablet Take 1  tablet (1 mg total) by mouth daily. 90 tablet 3   calcitRIOL  (ROCALTROL ) 0.5 MCG capsule Take 1 capsule (0.5 mcg total) by mouth daily. 30 capsule 0   Calcium  Carb-Cholecalciferol  600-10 MG-MCG TABS Take 1 tablet by mouth 3 (three) times daily. 90 tablet 0   [EXPIRED] fluconazole  (DIFLUCAN ) 100 MG tablet Take 2 tablets (200 mg total) by mouth daily for 12 days. 24 tablet 0   gabapentin  (NEURONTIN ) 300 MG capsule Take 1 capsule (300 mg total) by mouth 3 (three) times daily. 90 capsule 6   montelukast  (SINGULAIR ) 10 MG tablet Take 1 tablet (10 mg total) by mouth at bedtime. 90 tablet 3   oxyCODONE -acetaminophen  (PERCOCET) 7.5-325 MG tablet Take 1-2 tablets by mouth every 6 (six) hours as needed for severe pain (pain score 7-10). 102 tablet 0   pantoprazole  (PROTONIX ) 40 MG tablet Take 1  tablet (40 mg total) by mouth 2 (two) times daily. 60 tablet 0   polyethylene glycol powder (GLYCOLAX /MIRALAX ) 17 GM/SCOOP powder Mix 17 g in 4 oz of liquid and take by mouth 2 (two) times daily as needed. (Patient taking differently: Take 17 g by mouth 2 (two) times daily as needed (for constipation).) 3350 g 1   prochlorperazine  (COMPAZINE ) 10 MG tablet Take 1 tablet (10 mg total) by mouth every 6 (six) hours as needed for nausea or vomiting. 30 tablet 1   scopolamine  (TRANSDERM-SCOP) 1 MG/3DAYS Place 1 patch (1.5 mg total) onto the skin every 3 (three) days as needed (Dizziness, nausea or vomiting). 10 patch 12   sennosides-docusate sodium  (SENOKOT-S) 8.6-50 MG tablet Take 1 tablet by mouth daily. 120 tablet 1   abemaciclib  (VERZENIO ) 50 MG tablet Take 50 mg by mouth 2 (two) times daily. (Patient not taking: Reported on 12/22/2023)     [Paused] propranolol  (INDERAL ) 10 MG tablet Take 1 tablet (10 mg total) by mouth 2 (two) times daily. Please call [phone] to schedule an appointment for future refills. Thank you. 3rd attempt. (Patient not taking: Reported on 12/13/2023) 30 tablet 0   senna (SENOKOT) 8.6 MG TABS tablet  Take 2 tablets (17.2 mg total) by mouth at bedtime. (Patient not taking: Reported on 12/22/2023) 60 tablet 3     Physical Exam: Blood pressure 123/70, pulse 64, temperature 97.9 F (36.6 C), temperature source Temporal, resp. rate (!) 21, height 5' 11 (1.803 m), weight 69.6 kg, SpO2 96%. General: pleasant, WD, black female who is laying in bed in NAD HEENT: head is normocephalic, atraumatic.  Sclera are noninjected.  PERRL.  Ears and nose without any masses or lesions.  Mouth is pink and dry Heart: regular, rate, and rhythm.  Normal s1,s2. No obvious murmurs, gallops, or rubs noted.  Palpable radial and pedal pulses bilaterally Lungs: CTAB, no wheezes, rhonchi, or rales noted.  Respiratory effort nonlabored Abd: soft, mild epigastric tenderness and RUQ, but greatest in epigastrium, mild epigastric bloating, +BS, no masses, hernias, or organomegaly.  Pfannenstiel scar noted from hysterectomy MS: all 4 extremities are symmetrical with no cyanosis, clubbing, or edema. Psych: A&Ox3 with an appropriate affect.    Results for orders placed or performed during the hospital encounter of 12/22/23 (from the past 48 hours)  Comprehensive metabolic panel     Status: Abnormal   Collection Time: 12/26/23  3:43 AM  Result Value Ref Range   Sodium 138 135 - 145 mmol/L   Potassium 3.8 3.5 - 5.1 mmol/L   Chloride 110 98 - 111 mmol/L   CO2 19 (L) 22 - 32 mmol/L   Glucose, Bld 83 70 - 99 mg/dL    Comment: Glucose reference range applies only to samples taken after fasting for at least 8 hours.   BUN 6 6 - 20 mg/dL   Creatinine, Ser 9.13 0.44 - 1.00 mg/dL   Calcium  7.3 (L) 8.9 - 10.3 mg/dL   Total Protein 6.5 6.5 - 8.1 g/dL   Albumin 3.0 (L) 3.5 - 5.0 g/dL   AST 40 15 - 41 U/L   ALT 95 (H) 0 - 44 U/L   Alkaline Phosphatase 291 (H) 38 - 126 U/L   Total Bilirubin 0.7 0.0 - 1.2 mg/dL   GFR, Estimated >39 >39 mL/min    Comment: (NOTE) Calculated using the CKD-EPI Creatinine Equation (2021)    Anion gap  9 5 - 15    Comment: Performed at [facility], [address] [place], [place] [address]  Magnesium      Status: None   Collection Time: 12/26/23  3:43 AM  Result Value Ref Range   Magnesium  1.7 1.7 - 2.4 mg/dL    Comment: Performed at [facility], [address], [place], [place] [address]  Phosphorus     Status: Abnormal   Collection Time: 12/26/23  3:43 AM  Result Value Ref Range   Phosphorus 1.7 (L) 2.5 - 4.6 mg/dL    Comment: Performed at [facility], [address] [place], [place] [address]  CBC     Status: Abnormal   Collection Time: 12/26/23  7:03 PM  Result Value Ref Range   WBC 5.2 4.0 - 10.5 K/uL   RBC 3.72 (L) 3.87 - 5.11 MIL/uL   Hemoglobin 8.9 (L) 12.0 - 15.0 g/dL    Comment: Reticulocyte Hemoglobin testing may be clinically indicated, consider ordering this additional test [id number]    HCT 28.4 (L) 36.0 - 46.0 %   MCV 76.3 (L) 80.0 - 100.0 fL   MCH 23.9 (L) 26.0 - 34.0 pg   MCHC 31.3 30.0 - 36.0 g/dL   RDW 84.2 (H) 88.4 - 84.4 %   Platelets 268 150 - 400 K/uL   nRBC 0.0 0.0 - 0.2 %    Comment: Performed at [facility], [address], [place], [place] [address]  Comprehensive metabolic panel     Status: Abnormal   Collection Time: 12/27/23  4:10 AM  Result Value Ref Range   Sodium 139 135 - 145 mmol/L   Potassium 3.3 (L) 3.5 - 5.1 mmol/L   Chloride 111 98 - 111 mmol/L   CO2 18 (L) 22 - 32 mmol/L   Glucose, Bld 72 70 - 99 mg/dL    Comment: Glucose reference range applies only to samples taken after fasting for at least 8 hours.   BUN 5 (L) 6 - 20 mg/dL   Creatinine, Ser 9.25 0.44 - 1.00 mg/dL   Calcium  6.9 (L) 8.9 - 10.3 mg/dL   Total Protein 6.2 (L) 6.5 - 8.1 g/dL   Albumin 2.9 (L) 3.5 - 5.0 g/dL   AST 32 15 - 41 U/L   ALT 73 (H) 0 - 44 U/L   Alkaline Phosphatase 289 (H) 38 - 126 U/L   Total Bilirubin 0.9 0.0 - 1.2 mg/dL   GFR, Estimated >39 >39 mL/min    Comment:  (NOTE) Calculated using the CKD-EPI Creatinine Equation (2021)    Anion gap 10 5 - 15    Comment: Performed at [facility], [address], [place], [place] [address]   *Note: Due to a large number of results and/or encounters for the requested time period, some results have not been displayed. A complete set of results can be found in Results Review.   DG C-Arm 1-60 Min-No Report Result Date: 12/27/2023 Fluoroscopy was utilized by the requesting physician.  No radiographic interpretation.    Anti-infectives (From admission, onward)    Start     Dose/Rate Route Frequency Ordered Stop   12/22/23 2230  [MAR Hold]  fluconazole  (DIFLUCAN ) IVPB 200 mg        (MAR Hold since Tue 12/27/2023 at 1222.Hold Reason: Transfer to a Procedural area)   200 mg 100 mL/hr over 60 Minutes Intravenous Every 24 hours 12/22/23 2132         Assessment/Plan Pancreatic mass with duodenal stricture/obstruction The patient has been seen, examined, labs, vitals, chart,  and imaging personally reviewed.  She appears to have a pancreatic mass which did not light up on recent PET scan, but an acquired duodenal stricture now as well.  It seems fast for this to be a new pancreatic malignancy, but would also not be typical for a met from her breast cancer.  No evidence of h. Pylori or other ulcer disease noted on her EGDs.  Unfortunately due to her stricture a bx via EUS was unable to be obtained today.  We will need to further discuss her case with [facility], but the patient may ultimately require some type of surgical bypass, +/- intra-op bx.  Prior to any surgical intervention, I suspect the patient is likely significantly malnourished given her history.  I have written for a prealbumin, but also discussed placement of a picc line and initiation of TNA given her inability to tolerate orals.  Her CA19-9 is currently pending.  We will continue to closely follow with further recommendations as we get more  information.  FEN - CLD, but given inability to tolerate well, PICC/TNA VTE - SCDs ID - none currently needed  Metastatic breast cancer HTN HLD Likely protein calorie nutrition - place PICC, start TNA.  Prealbumin  asthma  I reviewed nursing notes, Consultant GI, oncology notes, hospitalist notes, last 24 h vitals and pain scores, last 48 h intake and output, last 24 h labs and trends, and last 24 h imaging results.   Burnard FORBES Banter, [facility] 12/27/2023, 3:08 PM Please see Amion for pager number during day hours 7:00am-4:30pm

## 2023-12-27 NOTE — Progress Notes (Addendum)
 Received permission from Patient [name] to share health information with [name], son.  [phone] Return called [name].  [name] requested MD to contact him after procedure.   Questions related to the purpose of EGD and results.  Note placed for MD to to contact.  1220 Patient Chelsea Hansen left for in wheelchair for procedure.  Alert and oriented x 4.  Called Endoscopy regarding medications given.  1530 Patient Chelsea Hansen returned with [name], RN on stretcher.  Bedside report received.  Patient is alert and oriented x 4.  Drowsy, but able to follow commands.  Bed alarm set.

## 2023-12-27 NOTE — Progress Notes (Signed)
 Pharmacy: TPN  Pharmacy received consult on 12/27/23 at 4p to start TPN for patient.  Our cut off time for ordering TPN is noon.    Will plan on initiating TPN on 12/28/22.  Iantha Batch, PharmD, BCPS 12/27/2023 4:55 PM

## 2023-12-27 NOTE — Anesthesia Procedure Notes (Addendum)
 Procedure Name: Intubation Date/Time: 12/27/2023 1:23 PM  Performed by: Joshua Vernell BROCKS, CRNAPre-anesthesia Checklist: Patient identified, Emergency Drugs available, Suction available and Patient being monitored Patient Re-evaluated:Patient Re-evaluated prior to induction Oxygen Delivery Method: Circle system utilized Preoxygenation: Pre-oxygenation with 100% oxygen Induction Type: IV induction and Rapid sequence Laryngoscope Size: Mac and 4 Grade View: Grade I Tube type: Oral Tube size: 7.0 mm Number of attempts: 1 Airway Equipment and Method: Stylet and Oral airway Placement Confirmation: ETT inserted through vocal cords under direct vision, positive ETCO2 and breath sounds checked- equal and bilateral Secured at: 21 cm Tube secured with: Tape Dental Injury: Teeth and Oropharynx as per pre-operative assessment

## 2023-12-27 NOTE — Op Note (Signed)
 [facility] Patient Name: Chelsea Hansen Procedure Date: 12/27/2023 MRN: [id number] Attending MD: Aloha Finner , MD, [id number] Date of Birth: 07/06/1970 CSN: [id number] Age: 53 Admit Type: Inpatient Procedure:                Upper GI endoscopy Indications:              Stenosis of the duodenum, For therapy of duodenal                            stenosis Providers:                Aloha Finner, MD, Randall Lines, RN, Farris Southgate, Technician Referring MD:              Medicines:                General Anesthesia Complications:            No immediate complications. Estimated Blood Loss:     Estimated blood loss was minimal. Procedure:                Pre-Anesthesia Assessment:                           - Prior to the procedure, a History and Physical                            was performed, and patient medications and                            allergies were reviewed. The patient's tolerance of                            previous anesthesia was also reviewed. The risks                            and benefits of the procedure and the sedation                            options and risks were discussed with the patient.                            All questions were answered, and informed consent                            was obtained. Prior Anticoagulants: The patient has                            taken no anticoagulant or antiplatelet agents. ASA                            Grade Assessment: III - A patient with severe  systemic disease. After reviewing the risks and                            benefits, the patient was deemed in satisfactory                            condition to undergo the procedure.                           After obtaining informed consent, the endoscope was                            passed under direct vision. Throughout the                            procedure, the patient's blood  pressure, pulse, and                            oxygen saturations were monitored continuously. The                            [id number]-H190TL ([id number]) Olympus slim colonoscope was                            introduced through the mouth, and advanced to the                            duodenal bulb. After obtaining informed consent,                            the endoscope was passed under direct vision.                            Throughout the procedure, the patient's blood                            pressure, pulse, and oxygen saturations were                            monitored continuously. The [id number]-[id number] ([id number])                            Olympus ultra slim endoscope was introduced through                            the mouth, and advanced to the duodenal bulb. The                            upper EUS was accomplished without difficulty. The                            patient tolerated the procedure. Scope In: Scope Out: Findings:      No gross lesions were noted in the proximal esophagus.      LA  Grade C (one or more mucosal breaks continuous between tops of 2 or       more mucosal folds, less than 75% circumference) esophagitis with no       bleeding was found in the distal esophagus.      A 2 cm hiatal hernia was present.      Retained fluid was found in the entire examined stomach. Suction via       Endoscope was performed removed 500 mL.      Patchy severely erythematous mucosa was found in the entire examined       stomach. No change from yesterday.      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 Under fluoroscopy, a long 450 cm       0.035 Jagwire was able to be placed into the deeper duodenum.       Subsequently, A TTS dilator was passed over the wire and through the       scope. Dilation with an 01-27-09 mm and a 04-01-11 mm pyloric balloon       dilator was performed under fluoroscopic guidance up to a  total of 12       mm. Looking at the length of the balloon, this stenosis is greater than       5 cm in length. The dilation site was examined following each dilation       by bringing the balloon down and once we got up to 10 mm we began to see       mucosal wrenting and by 12 mm 3 larger mucosal wrents/mucosal       disruptions were noted, moderate improvement in luminal narrowing and no       perforation. However, even trying to pass the ultraslim colonoscope (10       mm in size or the neonatal endoscope 7 mm in size) did not allow for the       scope to pass into the second portion of the duodenum. Impression:               - No gross lesions in the proximal esophagus.                           - LA Grade C esophagitis with no bleeding found                            distally.                           - 2 cm hiatal hernia.                           - Retained gastric fluid.                           - Erythematous mucosa in the stomach. No change                            from yesterday.                           - Acquired duodenal stenosis at the  duodenal sweep.                            Dilated under fluoroscopy up to 12 mm. It is felt                            that this duodenal stenosis is greater than 5 cm in                            length. Unable to traverse even after dilation. Moderate Sedation:      Not Applicable - Patient had care per Anesthesia. Recommendation:           - The patient will be observed post-procedure,                            until all discharge criteria are met.                           - Return patient to hospital ward for ongoing care.                           - Clear liquid diet.                           - Continue IV PPI BID.                           - Add Carafate  1 g Q6H liquid slurry.                           - Most ideal would be that patient remains n.p.o.                            can trial CLD, but suspect that she will have                             issues quite quickly. I think we should prepare for                            possible TPN.                           - Will proceed with surgical consultation to                            evaluate for Diagnostic Laparascopy to consider                            attempt at evaluation/sampling of the pancreatic                            head/uncinate to see if mass/lesion is visible  where sampling can occur (i.e intraprocedural                            ultrasound?). Can have further discussions, but                            suspect that patient will need GJ bypass to be                            performed in setting of persisting, symptoms so                            surgical evaluation will be helpful for this as                            well (or would patient be on TPN for some period of                            time first before making this decision and/or if                            sampling does occur of the pancreas had for that to                            return before proceeding with surgery). I have sent                            page to surgical service today to discuss this                            further.                           - The findings and recommendations were discussed                            with the patient.                           - The findings and recommendations were discussed                            with the referring physician. Procedure Code(s):        --- Professional ---                           [phone], Esophagogastroduodenoscopy, flexible,                            transoral; with dilation of gastric/duodenal                            stricture(s) (eg, balloon, bougie)  25639, 26, Intraluminal dilation of strictures                            and/or obstructions (eg, esophagus), radiological                            supervision and  interpretation Diagnosis Code(s):        --- Professional ---                           K20.90, Esophagitis, unspecified without bleeding                           K44.9, Diaphragmatic hernia without obstruction or                            gangrene                           K31.89, Other diseases of stomach and duodenum                           K31.5, Obstruction of duodenum CPT copyright 2022 [employer]. All rights reserved. The codes documented in this report are preliminary and upon coder review may  be revised to meet current compliance requirements. Aloha Finner, MD 12/27/2023 2:58:18 PM Number of Addenda: 0

## 2023-12-27 NOTE — Progress Notes (Incomplete Revision)
 Received permission from [name] to share health information with Chelsea Hansen, son.  [phone] Return called Chelsea Hansen.  [name] requested MD to contact him after procedure.   Questions related to the purpose of EGD and results.  Note placed for MD to to contact.  1220 Patient Chelsea Hansen left for in wheelchair for procedure.  Alert and oriented x 4.  Called Endoscopy regarding medications given.  1530 Patient Chelsea Hansen returned with [name], RN on stretcher.  Bedside report received.  Patient is alert and oriented x 4.  Drowsy, but able to follow commands.  Bed alarm set.

## 2023-12-27 NOTE — Anesthesia Postprocedure Evaluation (Signed)
 Anesthesia Post Note  Patient: SVEA PUSCH  Procedure(s) Performed: EGD (ESOPHAGOGASTRODUODENOSCOPY) ULTRASOUND, UPPER GI TRACT, ENDOSCOPIC Balloon dilation wire-guided     Patient location during evaluation: PACU Anesthesia Type: General Level of consciousness: awake and alert Pain management: pain level controlled Vital Signs Assessment: post-procedure vital signs reviewed and stable Respiratory status: spontaneous breathing, nonlabored ventilation and respiratory function stable Cardiovascular status: blood pressure returned to baseline Postop Assessment: no apparent nausea or vomiting Anesthetic complications: no   No notable events documented.  Last Vitals:  Vitals:   12/27/23 1446 12/27/23 1450  BP: 130/72 122/65  Pulse: 71 70  Resp: (!) 26 (!) 24  Temp:    SpO2: 100% 98%    Last Pain:  Vitals:   12/27/23 1450  TempSrc:   PainSc: 0-No pain                 Vertell Row

## 2023-12-28 ENCOUNTER — Inpatient Hospital Stay (HOSPITAL_COMMUNITY): Payer: MEDICAID

## 2023-12-28 ENCOUNTER — Encounter (HOSPITAL_COMMUNITY): Payer: Self-pay | Admitting: Gastroenterology

## 2023-12-28 ENCOUNTER — Other Ambulatory Visit: Payer: Self-pay

## 2023-12-28 ENCOUNTER — Encounter: Payer: Self-pay | Admitting: Adult Health

## 2023-12-28 ENCOUNTER — Encounter: Payer: Self-pay | Admitting: *Deleted

## 2023-12-28 DIAGNOSIS — D509 Iron deficiency anemia, unspecified: Secondary | ICD-10-CM

## 2023-12-28 DIAGNOSIS — R112 Nausea with vomiting, unspecified: Secondary | ICD-10-CM | POA: Diagnosis not present

## 2023-12-28 DIAGNOSIS — K209 Esophagitis, unspecified without bleeding: Secondary | ICD-10-CM | POA: Diagnosis not present

## 2023-12-28 DIAGNOSIS — K315 Obstruction of duodenum: Secondary | ICD-10-CM | POA: Diagnosis not present

## 2023-12-28 DIAGNOSIS — N179 Acute kidney failure, unspecified: Secondary | ICD-10-CM | POA: Diagnosis not present

## 2023-12-28 LAB — COMPREHENSIVE METABOLIC PANEL WITH GFR
ALT: 67 U/L — ABNORMAL HIGH (ref 0–44)
AST: 32 U/L (ref 15–41)
Albumin: 3 g/dL — ABNORMAL LOW (ref 3.5–5.0)
Alkaline Phosphatase: 318 U/L — ABNORMAL HIGH (ref 38–126)
Anion gap: 9 (ref 5–15)
BUN: 8 mg/dL (ref 6–20)
CO2: 18 mmol/L — ABNORMAL LOW (ref 22–32)
Calcium: 7.5 mg/dL — ABNORMAL LOW (ref 8.9–10.3)
Chloride: 110 mmol/L (ref 98–111)
Creatinine, Ser: 0.69 mg/dL (ref 0.44–1.00)
GFR, Estimated: >60 mL/min (ref >60)
Glucose, Bld: 126 mg/dL — ABNORMAL HIGH (ref 70–99)
Potassium: 4.1 mmol/L (ref 3.5–5.1)
Sodium: 137 mmol/L (ref 135–145)
Total Bilirubin: 1.1 mg/dL (ref 0.0–1.2)
Total Protein: 6.4 g/dL — ABNORMAL LOW (ref 6.5–8.1)

## 2023-12-28 LAB — CANCER ANTIGEN 19-9: CA 19-9: 465 U/mL — ABNORMAL HIGH (ref 0–35)

## 2023-12-28 LAB — GLUCOSE, CAPILLARY
Glucose-Capillary: 131 mg/dL — ABNORMAL HIGH (ref 70–99)
Glucose-Capillary: 137 mg/dL — ABNORMAL HIGH (ref 70–99)

## 2023-12-28 LAB — PHOSPHORUS: Phosphorus: 1.7 mg/dL — ABNORMAL LOW (ref 2.5–4.6)

## 2023-12-28 LAB — MAGNESIUM: Magnesium: 1.4 mg/dL — ABNORMAL LOW (ref 1.7–2.4)

## 2023-12-28 LAB — SURGICAL PATHOLOGY

## 2023-12-28 MED ORDER — FAT EMUL FISH OIL/PLANT BASED 20% (SMOFLIPID)IV EMUL
250.0000 mL | INTRAVENOUS | Status: AC
  Administered 2023-12-28: 250 mL via INTRAVENOUS
  Filled 2023-12-28: qty 250

## 2023-12-28 MED ORDER — INSULIN ASPART 100 UNIT/ML IJ SOLN
0.0000 [IU] | Freq: Four times a day (QID) | INTRAMUSCULAR | Status: DC
  Administered 2024-01-02: 1 [IU] via SUBCUTANEOUS
  Administered 2024-01-03 (×2): 3 [IU] via SUBCUTANEOUS

## 2023-12-28 MED ORDER — SODIUM CHLORIDE 0.9% FLUSH
10.0000 mL | Freq: Two times a day (BID) | INTRAVENOUS | Status: DC
  Administered 2023-12-28 – 2024-01-11 (×12): 10 mL
  Administered 2024-01-11: 20 mL
  Administered 2024-01-12 – 2024-01-13 (×2): 10 mL
  Administered 2024-01-13 – 2024-01-15 (×2): 20 mL
  Administered 2024-01-16 – 2024-01-17 (×3): 10 mL

## 2023-12-28 MED ORDER — SODIUM PHOSPHATES 45 MMOLE/15ML IV SOLN
30.0000 mmol | Freq: Once | INTRAVENOUS | Status: AC
  Administered 2023-12-28: 30 mmol via INTRAVENOUS
  Filled 2023-12-28: qty 10

## 2023-12-28 MED ORDER — CHLORHEXIDINE GLUCONATE CLOTH 2 % EX PADS
6.0000 | MEDICATED_PAD | Freq: Every day | CUTANEOUS | Status: DC
  Administered 2023-12-28 – 2024-01-17 (×20): 6 via TOPICAL

## 2023-12-28 MED ORDER — MAGNESIUM SULFATE 2 GM/50ML IV SOLN
2.0000 g | Freq: Once | INTRAVENOUS | Status: AC
  Administered 2023-12-28: 2 g via INTRAVENOUS
  Filled 2023-12-28: qty 50

## 2023-12-28 MED ORDER — MAGNESIUM SULFATE 4 GM/100ML IV SOLN
4.0000 g | Freq: Once | INTRAVENOUS | Status: AC
  Administered 2023-12-28: 4 g via INTRAVENOUS
  Filled 2023-12-28: qty 100

## 2023-12-28 MED ORDER — SODIUM CHLORIDE 0.9% FLUSH: 10.0000 mL | INTRAVENOUS | Status: DC | PRN

## 2023-12-28 MED ORDER — CLINIMIX E/DEXTROSE (8/10) 8 % IV SOLN
INTRAVENOUS | Status: AC
  Filled 2023-12-28 (×2): qty 2000

## 2023-12-28 MED ORDER — SODIUM CHLORIDE 0.9 % IV SOLN: INTRAVENOUS | Status: AC

## 2023-12-28 NOTE — Progress Notes (Signed)
 Progress Note   Patient: Chelsea Hansen FMW:[id number] DOB: July 21, 1970 DOA: 12/22/2023     5 DOS: the patient was seen and examined on 12/28/2023   Brief hospital course: Chelsea Hansen is a 53 y.o. female with a history of metastatic breast cancer, duodenal stenosis, esophageal candidiasis, constipation, hyperlipidemia, hypertension, anemia, DVT, asthma.  Patient presented secondary to abdominal pain and recurrent emesis with concern for possible biliary process and with associated AKI.  Imaging concerning for possible pancreatic lesion in addition to possible gallbladder pathology.  Patient started on IV fluids.  Gastroenterology consulted for recommendations. MRCP ordered and with findings concerning for primary pancreatic adenocarcinoma.   EUS attempted on 7/7, but unsuccessful secondary to duodenal stenosis.  Patient is being planned for surgical procedure for next week for nutritional management.  Nutrition team advised for TPN.  Patient to continue TPN.  Assessment and Plan:  AKI on CKD IIIa Secondary to intractable nausea and vomiting with resultant hypovolemia. Baseline creatinine of about 1. Creatinine of 3.25 on admission. Patient started on IV fluids. - Continue IV fluids - Daily BMP   Nausea and Vomiting Present on admission.  Unclear etiology. History of candida esophagitis on fluconazole . Having bowel movements. Abdominal x-ray without evidence of obstruction. Still with recurrent emesis with scheduled compazine . Endoscopic procedure on 7/7 significant for significant retained gastric fluid with evidence of duodenal stenosis. - Continue Compazine  scheduled - Discontinue Ativan  scheduled - Continue Zofran  and Reglan  as needed - GI recommendations: repeat endoscopy today   Hypokalemia Secondary to GI losses from recurrent emesis. - Potassium supplementation as needed   Elevated AST/ALT Elevated Lipase Pancreatic mass with duodenal stricture/obstruction in the setting of  metastatic breast cancer CT chest/abdomen/pelvis and right upper quadrant ultrasound obtained on admission and significant for evidence of stranding of the proximal pancreas with possible underlying vague mass, borderline gallbladder wall thickening, with no evidence of cholelithiasis or pericholecystic fluid.  Gastroenterology was consulted on admission.  LFTs trending down. MRCP ordered and concerning for pancreatic adenocarcinoma. EUS failed secondary to duodenal stenosis - GI and general surgery on the case.  Surgery wants to take her to the OR next week with some improvement in nutritional status with TPN.  Candida esophagitis Patient diagnosed on 6/24. Patient has been on fluconazole  as an outpatient, but with recurrent emesis, has not received consistent dosing. Patient started on fluconazole . - Continue fluconazole  per GI recommendations   Hypocalcemia Low, even accounting for albumin. Calcium  gluconate given with improvement. Back down today. - Calcium  gluconate   Hypomagnesemia -Secondary to GI losses.  -Improved with magnesium  supplementation. -Continue magnesium  supplementation IV -Continue to monitor   Hypophosphatemia - Phosphorus supplementation   Acute on chronic anemia Baseline hemoglobin appears to be around 9-10. Hemoglobin normal at 12.7 on admission. This may be possibly related to hemoconcentration as she has drifted down towards baseline.  Hemoglobin down to 8.5 now. No episodes of bleeding noted. Hemoglobin stable today. - CBC in AM   Constipation - Continue MiraLAX    History of breast cancer Metastasis to bone Patient follows with Dr. [name] as an outpatient and is on anastrazole. She was previously on abemaciclib , which has been held.  CT this admission confirms metastatic osseous lesions.   Hyperlipidemia Noted. Patient is not on statin therapy as an outpatient.   Chronic pain Patient is on Percocet and gabapentin  as an outpatient. - Morphine  IV as  needed while still having recurrent vomiting     DVT prophylaxis: SCDs     Subjective:  She was asking about nutrition otherwise no new symptoms  Physical Exam: Vitals:   12/27/23 2348 12/28/23 0401 12/28/23 0804 12/28/23 1235  BP: 133/67 129/69 (!) 149/81 120/76  Pulse: (!) 58 66 (!) 59 63  Resp: 15 14 18 17   Temp: 97.8 F (36.6 C) 97.6 F (36.4 C) 98.8 F (37.1 C) 98.1 F (36.7 C)  TempSrc: Oral Oral Oral Oral  SpO2: 98% 98% 99% 100%  Weight:      Height:       Constitutional: Alert, awake, calm, comfortable HEENT: Neck supple Respiratory: clear to auscultation bilaterally, no wheezing, no crackles. Normal respiratory effort. No accessory muscle use.  Cardiovascular: Regular rate and rhythm, no murmurs / rubs / gallops. No extremity edema. 2+ pedal pulses. No carotid bruits.  Abdomen: no tenderness, no masses palpated. No hepatosplenomegaly. Bowel sounds positive.  Musculoskeletal: no clubbing / cyanosis. No joint deformity upper and lower extremities. Good ROM, no contractures. Normal muscle tone.  Skin: no rashes, lesions, ulcers. No induration Neurologic: CN 2-12 grossly intact. Sensation intact, DTR normal. Strength 5/5 x all 4 extremities.  Psychiatric: Normal judgment and insight. Alert and oriented x 3. Normal mood.   Data Reviewed:  Magnesium  was 1.4, phosphorus was 1.7  Family Communication: No family member was around today  Disposition: Status is: Inpatient Remains inpatient appropriate because: Ongoing treatment for pancreatic mass  Planned Discharge Destination: Rehab    Time spent: 35 minutes  Author: Nena Rebel, MD 12/28/2023 4:44 PM  For on call review [email or web address].

## 2023-12-28 NOTE — Progress Notes (Signed)
 PHARMACY - TOTAL PARENTERAL NUTRITION CONSULT NOTE   Indication: Pancreatic mass with duodenal stricture/obstruction; awaiting surgical intervention  Patient Measurements: Height: 5' 11 (180.3 cm) Weight: 69.6 kg (153 lb 7 oz) IBW/kg (Calculated) : 70.8 TPN AdjBW (KG): 69.6 Body mass index is 21.4 kg/m. Usual Weight: 92.5 kg in April of this year  Assessment:  Pharmacy consulted to manage TPN for this 53 yo needing nutritional support while awaiting surgical intervention for pancreatic mass with duodenal stricture/obstruction in the setting of metastatic breast cancer resulting in poor oral intake, persistent nausea and vomiting,  and weight loss.  Glucose / Insulin :  no Hx DM. BG goal <180 -CBGs < 150 -Not on SSI currently Electrolytes: phos low at 1.7, magnesium  low at 1.4, CoCa slightly low at 8.3; others WNL Renal: SCr and BUN WNL Hepatic: alk phos elevated, AST WNL /ALT elevated, Total Bilirubin WNL, Total Protein elevated, triglycerides elevated at 196 Intake / Output; MIVF:  MIVF: NS 100 ml/hr Urine: 2200 ml Emesis: 1x LBM: 7/3 GI Imaging: 7/4 MR abdomen MRCP: pancreatic mass, vertebral metastases, pancolonic diverticulosis GI Surgeries / Procedures:  7/8 EGD 7/7 EUS, upper GI tract Central access: PICC line placement ordered TPN start date: 7/9  Nutritional Goals: pending dietary assessment Goal TPN rate is ___ mL/hr (provides ___ g of protein and ___ kcals per day) - to be determined  Using Clinimix  E 8/10 to deplete remaining supply acquired during shortage of materials  Clinimix  E 8/10 at 40 ml/hr + lipids will provide 77 g of protein and 1114 Kcal over 24 hr  RD Assessment: pending Estimated Needs Total Energy Estimated Needs: 2000-2300kcal/day Total Protein Estimated Needs: 100-115g/day Total Fluid Estimated Needs: 2.1-2.4L/day  Current Nutrition:  Clear liquid  Plan:  Sodium Phosphate  30 mmol IV x 1 Magnesium  4 g IV x 1  Start TPN at 40 mL/hr at  1800 - Using 2L Clinimix -E to deplete remaining supply Electrolytes in TPN: Na 34 mEq/L, K 29 mEq/L, Ca 4 mEq/L, Mg 5 mEq/L, and Phos 15 mmol/L. Cl:Ac 1:1  Continue MVI with minerals one tablet po qday Initiate very sensitive q6h SSI and adjust as needed  Reduce MIVF to 60 mL/hr at 1800 Monitor TPN labs on Mon/Thurs   Thank you for allowing pharmacy to be a part of this patient's care.  Eleanor EMERSON Agent, PharmD, BCPS Clinical Pharmacist  12/28/2023 9:51 AM

## 2023-12-28 NOTE — Progress Notes (Signed)
 1 Day Post-Op  Subjective: Patient wanting solid food today.  Says she hasn't thrown up and is taking a small amount of clear liquids.  Tearful to know she needs to remain inpatient for a while.  Still with some central abdominal pain  ROS: See above, otherwise other systems negative  Objective: Vital signs in last 24 hours: Temp:  [97.5 F (36.4 C)-98.9 F (37.2 C)] 98.8 F (37.1 C) (07/09 0804) Pulse Rate:  [58-105] 59 (07/09 0804) Resp:  [14-26] 18 (07/09 0804) BP: (103-149)/(65-83) 149/81 (07/09 0804) SpO2:  [96 %-100 %] 99 % (07/09 0804) Weight:  [69.6 kg] 69.6 kg (07/08 1228) Last BM Date : 12/22/23  Intake/Output from previous day: 07/08 0701 - 07/09 0700 In: 1000 [I.V.:1000] Out: 2650 [Urine:2200] Intake/Output this shift: No intake/output data recorded.  PE: Gen: NAD Lungs: normal respiratory effort Abd: soft, not really distended, central/epigastric tenderness  Lab Results:  Recent Labs    12/26/23 1903  WBC 5.2  HGB 8.9*  HCT 28.4*  PLT 268   BMET Recent Labs    12/27/23 0410 12/28/23 0412  NA 139 137  K 3.3* 4.1  CL 111 110  CO2 18* 18*  GLUCOSE 72 126*  BUN 5* 8  CREATININE 0.74 0.69  CALCIUM  6.9* 7.5*   PT/INR No results for input(s): LABPROT, INR in the last 72 hours. CMP     Component Value Date/Time   NA 137 12/28/2023 0412   NA 144 11/30/2023 1000   K 4.1 12/28/2023 0412   CL 110 12/28/2023 0412   CO2 18 (L) 12/28/2023 0412   GLUCOSE 126 (H) 12/28/2023 0412   BUN 8 12/28/2023 0412   BUN 19 11/30/2023 1000   CREATININE 0.69 12/28/2023 0412   CREATININE 3.25 (H) 12/22/2023 0957   CALCIUM  7.5 (L) 12/28/2023 0412   PROT 6.4 (L) 12/28/2023 0412   PROT 7.3 11/30/2023 1000   ALBUMIN 3.0 (L) 12/28/2023 0412   ALBUMIN 4.5 11/30/2023 1000   AST 32 12/28/2023 0412   AST 146 (H) 12/22/2023 0957   ALT 67 (H) 12/28/2023 0412   ALT 334 (HH) 12/22/2023 0957   ALKPHOS 318 (H) 12/28/2023 0412   BILITOT 1.1 12/28/2023 0412    BILITOT 0.9 12/22/2023 0957   GFRNONAA >60 12/28/2023 0412   GFRNONAA 17 (L) 12/22/2023 0957   GFRAA >60 09/07/2019 1340   Lipase     Component Value Date/Time   LIPASE 52 (H) 12/22/2023 2233       Studies/Results: US  EKG SITE RITE Result Date: 12/27/2023 If Site Rite image not attached, placement could not be confirmed due to current cardiac rhythm.  DG C-Arm 1-60 Min-No Report Result Date: 12/27/2023 Fluoroscopy was utilized by the requesting physician.  No radiographic interpretation.    Anti-infectives: Anti-infectives (From admission, onward)    Start     Dose/Rate Route Frequency Ordered Stop   12/22/23 2230  fluconazole  (DIFLUCAN ) IVPB 200 mg        200 mg 100 mL/hr over 60 Minutes Intravenous Every 24 hours 12/22/23 2132          Assessment/Plan Pancreatic mass with duodenal stricture/obstruction in the setting of metastatic breast cancer -prealbumin is 13 surprisingly.  PICC/TNA ordered for nutritional support given her stricture and inability to get much or any nutrition from oral intake. -should remain on max CLD at this point to avoid further vomiting -CA 19-9 still in process -discussed case with [facility] surgeon.  Plan at this time is nutritional support  this week/weekend with TNA and plan for bypass surgery next week, possible g-tube, etc.   -no plans to biopsy the pancreas intra-operatively as that tends to lead to significant bleeding or leak complications.  If there are any  nodules, etc that are visible then would plan to biopsy these. -plan discussed with patient.  She is clearly upset about the timeline of events that we have discussed.  She is agreeable to this though.  FEN - CLD, TNA to start today VTE - ok for chemical prophylaxis from our standpoint ID - [email or web address]   HTN HLD protein calorie nutrition - place PICC, start TNA.  Prealbumin 13 from 20 just 5 days ago asthma  I reviewed Consultant GI notes, hospitalist notes, last 24 h vitals and pain  scores, last 48 h intake and output, last 24 h labs and trends, and last 24 h imaging results.   LOS: 5 days    Burnard FORBES Banter , [facility] 12/28/2023, 8:48 AM Please see Amion for pager number during day hours 7:00am-4:30pm or 7:00am -11:30am on weekends

## 2023-12-28 NOTE — Progress Notes (Signed)
 Nutrition Follow-up  INTERVENTION:   Monitor magnesium , potassium, and phosphorus daily for at least 3 days, MD to replete as needed, as pt is at risk for refeeding syndrome. -Continue 100 mg Thiamine  daily  -TPN management per Pharmacy -Daily weights while on TPN  -If patient unable to tolerate PO medications, recommend adding MVI to TPN  -Resume Vitamin D  supplement when able  NUTRITION DIAGNOSIS:   Inadequate oral intake related to acute illness as evidenced by per patient/family report.  Ongoing.  GOAL:   Patient will meet greater than or equal to 90% of their needs  Not meeting.  MONITOR:   PO intake, Supplement acceptance, Labs, Weight trends, I & O's, Skin  REASON FOR ASSESSMENT:   Consult New TPN/TNA  ASSESSMENT:   52 y.o. 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  7/3: admitted, clear liquids 7/5: FLD 7/6: CLD 7/7: NPO, s/p EUS 7/8: CLD, s/p EGD  Patient unable to have diet advanced given continued N/V. Surgery following and recommending TPN this week until bypass surgery sometime next week. Considering G-tube placement. Will await plan of care.  TPN to begin tonight at 40 ml/hr, providing 1113 kcals, 76g protein.   Admission weight: 169 lbs Current weight: 153 lbs  Medications: Multivitamin with minerals daily, Miralax , Carafate , IV Thiamine , Vitamin D  weekly, IV Mg sulfate, sodium phos, Zofran   Labs reviewed: Low Phos Low Magnesium    Diet Order:   Diet Order             Diet clear liquid Room service appropriate? Yes; Fluid  consistency: Thin  Diet effective now                   EDUCATION NEEDS:   No education needs have been identified at this time  Skin:  Skin Assessment: Reviewed RN Assessment  Last BM:  7/3  Height:   Ht Readings from Last 1 Encounters:  12/27/23 5' 11 (1.803 m)    Weight:   Wt Readings from Last 1 Encounters:  12/27/23 69.6 kg    Ideal Body Weight:  70 kg  BMI:  Body mass index is 21.4 kg/m.  Estimated Nutritional Needs:   Kcal:  2000-2300kcal/day  Protein:  100-115g/day  Fluid:  2.1-2.4L/day   Morna Lee, MS, RD, LDN Inpatient Clinical Dietitian Contact via Secure chat

## 2023-12-28 NOTE — Progress Notes (Signed)
 [name]  CC:  N/V, pancreatic mass   Subjective: She had nausea and vomited a small amount of yellow fluid around 1:30pm without further recurrence after receiving Ondansetron  4mg  IV. She endorses having generalized central abdominal pain which has somewhat increased today, no severe abdominal pain. No BM x 5 or 6 days. No gas per the rectum today.   Objective:   EGD/EUS 12/28/2023:  EGD impression:  - No gross lesions in the proximal esophagus.  - LA Grade C esophagitis with no bleeding found distally (appears improved from prior).  - Z-line irregular, 39 cm from the incisors.  - 3 cm hiatal hernia.  - Retained gastric fluid. 900 cc removed on today's procedure.  - J-shaped and dilation deformity in the entire stomach. - Erythematous mucosa in the stomach. Previously biopsied and negative for HP.  - No gross lesions in the duodenal bulb.  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 Biopsied.   EUS impression:  - Pancreatic parenchymal abnormalities consisting of diffuse echogenicity and lobularity were noted in the pancreatic head, genu of the pancreas, pancreatic body and pancreatic tail. No overt mass/lesion was noted in this particular regions visualized. Uncinate process not able to be visualized.  - The pancreatic duct within the head was prominent and was dilated in the genu of the pancreas, body of the pancreas and tail of the pancreas.  - There was a normal-appearing common bile duct and prominent/slightly dilated common hepatic duct.  - No malignant-appearing lymph nodes were visualized in the celiac region (level 20), peripancreatic region and porta hepatis region.  EGD 12/27/2023: - No gross lesions in the proximal esophagus.  - LA Grade C esophagitis with no bleeding found distally.  - 2 cm hiatal hernia.  - Retained gastric fluid.  - Erythematous mucosa in the  stomach. No change from yesterday.  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 Dilated under fluoroscopy up to 12 mm. It is felt that this duodenal stenosis is greater than 5 cm in length. Unable to traverse even after dilation.   Vital signs in last 24 hours: Temp:  [97.6 F (36.4 C)-98.8 F (37.1 C)] 98.1 F (36.7 C) (07/09 1235) Pulse Rate:  [58-74] 63 (07/09 1235) Resp:  [14-26] 17 (07/09 1235) BP: (118-149)/(65-83) 120/76 (07/09 1235) SpO2:  [96 %-100 %] 100 % (07/09 1235) Last BM Date : 12/22/23 General: Fatigued appearing 53 year old female in no acute distress. Heart: Rate and rhythm, no murmurs. Pulm: Breath sounds clear throughout. Abdomen: Soft, mildly distended.  Moderate generalized tenderness without rebound or guarding.  Positive bowel sounds to all 4 quadrants. Extremities: No edema.  Neurologic:  Alert and oriented x 4.  Moves all extremities equally. Psych:  Alert and cooperative. Normal mood and affect.  Intake/Output from previous day: 07/08 0701 - 07/09 0700 In: 1000 [I.V.:1000] Out: 2650 [Urine:2200] Intake/Output this shift: Total I/O In: 360 [P.O.:360] Out: -   Lab Results: Recent Labs    12/26/23 1903  WBC 5.2  HGB 8.9*  HCT 28.4*  PLT 268   BMET Recent Labs    12/26/23 0343 12/27/23 0410 12/28/23 0412  NA 138 139 137  K 3.8 3.3* 4.1  CL 110 111 110  CO2 19* 18* 18*  GLUCOSE 83 72 126*  BUN 6 5* 8  CREATININE 0.86 0.74 0.69  CALCIUM  7.3* 6.9* 7.5*   LFT Recent Labs    12/28/23 9587  PROT 6.4*  ALBUMIN 3.0*  AST 32  ALT 67*  ALKPHOS 318*  BILITOT 1.1   PT/INR No results for input(s): LABPROT, INR in the last 72 hours. Hepatitis Panel No results for input(s): HEPBSAG, HCVAB, HEPAIGM, HEPBIGM in the last 72 hours.  US  EKG SITE RITE Result Date: 12/27/2023 If Site Rite image not attached, placement could not be confirmed due to current cardiac rhythm.  DG C-Arm 1-60 Min-No Report Result Date:  12/27/2023 Fluoroscopy was utilized by the requesting physician.  No radiographic interpretation.    Patient Profile:  53 y.o. 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 intake and upper abdominal pain with weight loss with MRI showing a masslike appearance of the pancreatic head.    Assessment / Plan:  N/V, abdominal pain secondary to duodenal stenosis and suspected pancreatic head mass with elevated LFTs. Total bili 0.9 -> 1.1.  Alk phos 289 -> 318.  AST 32.  ALT 73 -> 67.  CA 19-9 pending. 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 EGD 7/8 showed a significant amount of food in the stomach which required suctioning, a significant J-shaped and dilated deformity resulting in outlet obstruction, the duodenal stenosis appeared to be more significant when compared to prior EGD which could not be traversed. EUS 7/8 did not show any evidence of a mass or lesion but the inferior aspect of the head and the uncinate process could not be visualized without the therapeutic linear echoendoscope which would require traversing the duodenum therefore lesions possibly not visualized. Repeat EGD/EUS with fluoroscopy for dilatation of the duodenal stricture was attempted 7/9, the duodenal stenosis was dilated under fluoro up to 12mm but Dr. Wilhelmenia was unable to traverse after dilatation. General surgery consulted, to discuss case further with [facility] surgeon. To consider a diagnostic laparoscopy and to biopsy the  pancreatic head/uncinate process. Possible GJ bypass next week. TPN ordered, PICC line not yet placed. No BM x 5 to 6 days, no gas per the rectum today.  - Portable abdominal xray  - CTAP tomorrow if abdominal pain persists or worsens  - Clear liquid diet - Pain management and IV fluids per the hospitalist - Continue PPI IV twice daily - Continue Carafate  1 g slurry every 6 hours - Continue Ondansetron  4 mg IV every 6 hours as needed  and Reglan  5 mg IV every 6 hours as needed - Appreciate general surgery recommendations - Await further recommendations per Dr. Wilhelmenia  Microcytic anemia. Hg 8.5 -> 8.9 on 7/7.  No overt GI bleeding. - CBC, iron, TIBC, ferritin, B12 and folate in am   Recent diagnosis of esophagitis and candidiasis - Continue PPI IV bid and Fluconazole  therapy   Hypokalemia and hypomagnesemia - Management per the hospitalist  Principal Problem:   AKI (acute kidney injury) (HCC) Active Problems:   Hypokalemia   HLD (hyperlipidemia)   Breast cancer metastasized to bone, right (HCC)   Constipation   Acute kidney injury superimposed on chronic kidney disease (HCC)   Dehydration   Prolonged QT interval   Hypocalcemia   Acute esophagitis   Candida esophagitis (  HCC)   Transaminitis   Pancreatic mass   Right sided weakness   Hypotension   Abnormal CT of the abdomen   Elevated liver enzymes   Acute kidney injury (HCC)   Abnormal MRI of abdomen     LOS: 5 days   Chelsea Hansen  12/28/2023, 3:12 PM

## 2023-12-28 NOTE — Progress Notes (Signed)
 Chelsea Hansen   DOB:26-Jan-1971   FM#:[id number]   RDW#:[id number]  Subjective: Chelsea Hansen is a 53 year old with metastatic breast cancer to the bone, with duodenal stenosis, esophageal candidiasis admitted with persistent nausea/vomiting, dehydration, AKI.  She underwent MRCP that was concerning for primary pancreatic adenocarcinoma.  She underwent EUS and it was unsuccessful due to her duodenal stenosis.  She met with Dr. Vernetta who is planning for surgery next week.  In the meantime, she will undergo PICC placement and receive TPN.  She has not  experienced nausea or vomiting today and wants to understand better what is going on and what it may mean for her moving forward.     Objective:  Vitals:   12/28/23 1719 12/28/23 2008  BP: 126/81 117/73  Pulse: 69 64  Resp: 18 18  Temp: 98.2 F (36.8 C) 98.3 F (36.8 C)  SpO2: 100% 99%    Body mass index is 21.4 kg/m.  Intake/Output Summary (Last 24 hours) at 12/28/2023 2125 Last data filed at 12/28/2023 2100 Gross per 24 hour  Intake 870 ml  Output 900 ml  Net -30 ml     Sclerae anicteric  Patient lying in bed, tearful, no acute distress  Lungs no rales or wheezes--auscultated anterolaterally  Heart regular rate and rhythm  Abdomen soft, +BS  Neuro nonfocal   CBG (last 3)  Recent Labs    12/28/23 1817  GLUCAP 131*     Labs:  Basic Metabolic Panel: Recent Labs  Lab 12/23/23 0146 12/23/23 0936 12/24/23 0329 12/25/23 0346 12/26/23 0343 12/27/23 0410 12/28/23 0412  NA 135  --  138 138 138 139 137  K 2.6*   < > 3.1* 3.1* 3.8 3.3* 4.1  CL 97*  --  106 108 110 111 110  CO2 23  --  22 20* 19* 18* 18*  GLUCOSE 109*  --  101* 102* 83 72 126*  BUN 44*  --  19 10 6  5* 8  CREATININE 2.13*  --  0.88 1.00 0.86 0.74 0.69  CALCIUM  7.1*  --  7.5* 8.0* 7.3* 6.9* 7.5*  MG 2.5*  --  1.9 1.5* 1.7  --  1.4*  PHOS 2.9  --  2.2* 2.4* 1.7*  --  1.7*   < > = values in this interval not displayed.   GFR Estimated Creatinine Clearance: 91.4  mL/min (by C-G formula based on SCr of 0.69 mg/dL). Liver Function Tests: Recent Labs  Lab 12/24/23 0329 12/25/23 0346 12/26/23 0343 12/27/23 0410 12/28/23 0412  AST 83* 51* 40 32 32  ALT 157* 117* 95* 73* 67*  ALKPHOS 292* 285* 291* 289* 318*  BILITOT 0.6 0.5 0.7 0.9 1.1  PROT 6.3* 6.4* 6.5 6.2* 6.4*  ALBUMIN 2.9* 2.9* 3.0* 2.9* 3.0*   Recent Labs  Lab 12/22/23 2233  LIPASE 52*   No results for input(s): AMMONIA in the last 168 hours. Coagulation profile Recent Labs  Lab 12/22/23 2233  INR 1.2    CBC: Recent Labs  Lab 12/22/23 0957 12/22/23 0957 12/22/23 1553 12/22/23 1608 12/23/23 0146 12/24/23 0329 12/25/23 0346 12/26/23 1903  WBC 6.2  --  7.1  --  6.3 4.6 4.5 5.2  NEUTROABS 4.1  --  5.2  --   --   --   --   --   HGB 12.7   < > 12.1 13.3 9.9* 8.5* 8.5* 8.9*  HCT 38.8  --  38.8 39.0 32.0* 27.7* 26.8* 28.4*  MCV 70.3*  --  73.5*  --  74.8* 74.1* 74.2* 76.3*  PLT 352  --  344  --  253 200 187 268   < > = values in this interval not displayed.   Cardiac Enzymes: Recent Labs  Lab 12/22/23 1553  CKTOTAL 61    CBG: Recent Labs  Lab 12/28/23 1817  GLUCAP 131*   Studies:  DG Abd Portable 1V Result Date: 12/28/2023 CLINICAL DATA:  Initial evaluation for acute abdominal pain. Vomiting. EXAM: PORTABLE ABDOMEN - 1 VIEW COMPARISON:  Radiograph from 12/24/2023. FINDINGS: Mild gaseous distension of the stomach. Bowel gas pattern is nonobstructive. No appreciable bowel wall thickening. No soft tissue mass or abnormal calcification. No visible free air. Mild lumbar levoscoliosis with multilevel degenerative spondylosis. Visualized osseous structures demonstrate no acute finding. IMPRESSION: 1. Mild gaseous distension of the stomach. 2. Otherwise negative abdominal radiographs with overall nonobstructive bowel gas pattern. Electronically Signed   By: Morene Hoard M.D.   On: 12/28/2023 19:58   US  EKG SITE RITE Result Date: 12/27/2023 If Site Rite image not  attached, placement could not be confirmed due to current cardiac rhythm.  DG C-Arm 1-60 Min-No Report Result Date: 12/27/2023 Fluoroscopy was utilized by the requesting physician.  No radiographic interpretation.    Assessment/Plan: 53 y.o. year old woman with metastatic breast cancer admitted with intractable nausea and vomiting.    I met with Auria today and we reviewed her hospitalization and next steps.  I answered her questions about her EUS, the concern for primary pancreatic adenocarcinoma, the prognosis it can carry and next steps and why the next steps are necessary.    Abri was tearful around the fact that we are waiting for answers and that it will be a week until we know more. I validated her concerns and encouraged her to focus on what we can do in the next week which is to get the PICC (we reviewed why this is necessary for her TPN), get her on the TPN, and as she gets the intravenous nutrition, I encouraged her to work on regaining her strength. She verbalized understanding of the plan.    She will continue to be under the primary care and management of the hospitalist team and we appreciate your excellent care of this patient.    Time spent: 30 minutes   [name], NP 12/28/2023  9:25 PM Medical Oncology and Hematology [facility] [address] [place], [place] [address] Tel. [phone]    Fax. [phone]

## 2023-12-28 NOTE — Plan of Care (Signed)
  Problem: Education: Goal: Knowledge of General Education information will improve Description: Including pain rating scale, medication(s)/side effects and non-pharmacologic comfort measures Outcome: Progressing   Problem: Clinical Measurements: Goal: Diagnostic test results will improve Outcome: Progressing   Problem: Coping: Goal: Level of anxiety will decrease Outcome: Progressing   Problem: Pain Managment: Goal: General experience of comfort will improve and/or be controlled Outcome: Progressing

## 2023-12-28 NOTE — Progress Notes (Signed)
 Peripherally Inserted Central Catheter Placement  The IV Nurse has discussed with the patient and/or persons authorized to consent for the patient, the purpose of this procedure and the potential benefits and risks involved with this procedure.  The benefits include less needle sticks, lab draws from the catheter, and the patient may be discharged home with the catheter. Risks include, but not limited to, infection, bleeding, blood clot (thrombus formation), and puncture of an artery; nerve damage and irregular heartbeat and possibility to perform a PICC exchange if needed/ordered by physician.  Alternatives to this procedure were also discussed.  Bard Power PICC patient education guide, fact sheet on infection prevention and patient information card has been provided to patient /or left at bedside.    PICC Placement Documentation  PICC Double Lumen 12/28/23 Right Brachial 39 cm 0 cm (Active)  Indication for Insertion or Continuance of Line Administration of hyperosmolar/irritating solutions (i.e. TPN, Vancomycin , etc.) 12/28/23 1720  Exposed Catheter (cm) 0 cm 12/28/23 1720  Site Assessment Clean, Dry, Intact 12/28/23 1720  Lumen #1 Status Flushed;Saline locked;Blood return noted 12/28/23 1720  Lumen #2 Status Flushed;Saline locked;Blood return noted 12/28/23 1720  Dressing Type Transparent;Securing device 12/28/23 1720  Dressing Status Antimicrobial disc/dressing in place 12/28/23 1720  Line Care Connections checked and tightened 12/28/23 1720  Line Adjustment (NICU/IV Team Only) No 12/28/23 1720  Dressing Intervention New dressing;Adhesive placed at insertion site (IV team only) 12/28/23 1720  Dressing Change Due 01/04/24 12/28/23 1720       Leita  Yoshino Broccoli 12/28/2023, 5:21 PM

## 2023-12-29 ENCOUNTER — Inpatient Hospital Stay (HOSPITAL_COMMUNITY): Payer: MEDICAID

## 2023-12-29 ENCOUNTER — Other Ambulatory Visit: Payer: Self-pay

## 2023-12-29 DIAGNOSIS — K315 Obstruction of duodenum: Secondary | ICD-10-CM

## 2023-12-29 DIAGNOSIS — R112 Nausea with vomiting, unspecified: Secondary | ICD-10-CM

## 2023-12-29 DIAGNOSIS — K209 Esophagitis, unspecified without bleeding: Secondary | ICD-10-CM | POA: Diagnosis not present

## 2023-12-29 DIAGNOSIS — K869 Disease of pancreas, unspecified: Secondary | ICD-10-CM

## 2023-12-29 DIAGNOSIS — N179 Acute kidney failure, unspecified: Secondary | ICD-10-CM | POA: Diagnosis not present

## 2023-12-29 DIAGNOSIS — D509 Iron deficiency anemia, unspecified: Secondary | ICD-10-CM | POA: Diagnosis not present

## 2023-12-29 DIAGNOSIS — R634 Abnormal weight loss: Secondary | ICD-10-CM

## 2023-12-29 LAB — COMPREHENSIVE METABOLIC PANEL WITH GFR
ALT: 116 U/L — ABNORMAL HIGH (ref 0–44)
AST: 76 U/L — ABNORMAL HIGH (ref 15–41)
Albumin: 3 g/dL — ABNORMAL LOW (ref 3.5–5.0)
Alkaline Phosphatase: 333 U/L — ABNORMAL HIGH (ref 38–126)
Anion gap: 8 (ref 5–15)
BUN: 9 mg/dL (ref 6–20)
CO2: 22 mmol/L (ref 22–32)
Calcium: 6.9 mg/dL — ABNORMAL LOW (ref 8.9–10.3)
Chloride: 104 mmol/L (ref 98–111)
Creatinine, Ser: 0.73 mg/dL (ref 0.44–1.00)
GFR, Estimated: >60 mL/min (ref >60)
Glucose, Bld: 129 mg/dL — ABNORMAL HIGH (ref 70–99)
Potassium: 2.9 mmol/L — ABNORMAL LOW (ref 3.5–5.1)
Sodium: 134 mmol/L — ABNORMAL LOW (ref 135–145)
Total Bilirubin: 0.6 mg/dL (ref 0.0–1.2)
Total Protein: 6.3 g/dL — ABNORMAL LOW (ref 6.5–8.1)

## 2023-12-29 LAB — CBC
HCT: 25.9 % — ABNORMAL LOW (ref 36.0–46.0)
Hemoglobin: 8.3 g/dL — ABNORMAL LOW (ref 12.0–15.0)
MCH: 23.4 pg — ABNORMAL LOW (ref 26.0–34.0)
MCHC: 32 g/dL (ref 30.0–36.0)
MCV: 73.2 fL — ABNORMAL LOW (ref 80.0–100.0)
Platelets: 189 K/uL (ref 150–400)
RBC: 3.54 MIL/uL — ABNORMAL LOW (ref 3.87–5.11)
RDW: 15.4 % (ref 11.5–15.5)
WBC: 6.7 K/uL (ref 4.0–10.5)
nRBC: 0 % (ref 0.0–0.2)

## 2023-12-29 LAB — TRIGLYCERIDES: Triglycerides: 267 mg/dL — ABNORMAL HIGH (ref <150)

## 2023-12-29 LAB — PHOSPHORUS: Phosphorus: 1.8 mg/dL — ABNORMAL LOW (ref 2.5–4.6)

## 2023-12-29 LAB — GLUCOSE, CAPILLARY
Glucose-Capillary: 123 mg/dL — ABNORMAL HIGH (ref 70–99)
Glucose-Capillary: 140 mg/dL — ABNORMAL HIGH (ref 70–99)
Glucose-Capillary: 128 mg/dL — ABNORMAL HIGH (ref 70–99)
Glucose-Capillary: 116 mg/dL — ABNORMAL HIGH (ref 70–99)

## 2023-12-29 LAB — MAGNESIUM: Magnesium: 2.2 mg/dL (ref 1.7–2.4)

## 2023-12-29 MED ORDER — IOHEXOL 300 MG/ML  SOLN
100.0000 mL | Freq: Once | INTRAMUSCULAR | Status: AC | PRN
  Administered 2023-12-29: 100 mL via INTRAVENOUS

## 2023-12-29 MED ORDER — POTASSIUM CHLORIDE 10 MEQ/100ML IV SOLN
10.0000 meq | INTRAVENOUS | Status: AC
  Administered 2023-12-29 (×3): 10 meq via INTRAVENOUS
  Filled 2023-12-29 (×2): qty 100

## 2023-12-29 MED ORDER — TRAVASOL 10 % IV SOLN
INTRAVENOUS | Status: AC
  Filled 2023-12-29: qty 460.8

## 2023-12-29 MED ORDER — METOCLOPRAMIDE HCL 5 MG/ML IJ SOLN
5.0000 mg | Freq: Four times a day (QID) | INTRAMUSCULAR | Status: AC
  Administered 2023-12-29 – 2023-12-30 (×4): 5 mg via INTRAVENOUS
  Filled 2023-12-29 (×4): qty 2

## 2023-12-29 MED ORDER — POTASSIUM PHOSPHATES 15 MMOLE/5ML IV SOLN
30.0000 mmol | Freq: Once | INTRAVENOUS | Status: AC
  Administered 2023-12-29: 30 mmol via INTRAVENOUS
  Filled 2023-12-29: qty 10

## 2023-12-29 MED ORDER — POTASSIUM CHLORIDE 20 MEQ PO PACK
20.0000 meq | PACK | Freq: Once | ORAL | Status: DC
  Filled 2023-12-29: qty 1

## 2023-12-29 NOTE — Progress Notes (Addendum)
 PHARMACY - TOTAL PARENTERAL NUTRITION CONSULT NOTE   Indication: Pancreatic mass with duodenal stricture/obstruction; awaiting surgical intervention  Patient Measurements: Height: 5' 11 (180.3 cm) Weight: 69.6 kg (153 lb 7 oz) IBW/kg (Calculated) : 70.8 TPN AdjBW (KG): 69.6 Body mass index is 21.4 kg/m. Usual Weight: 92.5 kg in April of this year  Assessment:  Pharmacy consulted to manage TPN for this 53 yo needing nutritional support while awaiting surgical intervention for pancreatic mass with duodenal stricture/obstruction in the setting of metastatic breast cancer resulting in poor oral intake, persistent nausea and vomiting,  and weight loss.  Glucose / Insulin :  no Hx DM. BG goal <180 -q6h CBGs < 150 -last 24hr insulin : 0 Electrolytes: sodium low at 134, potassium low at 2.9, phos low at 1.8, magnesium  low at 1.4, CoCa low at 7.7; magnesium  WNL 7/10 potassium and phosphorus replaced by MD this morning Renal: SCr and BUN WNL Hepatic: alk phos elevated, AST/ALT elevated, Total Bilirubin WNL, Total Protein elevated, triglycerides pending Intake / Output; MIVF:  MIVF: NS 60 ml/hr Urine output: 900 ml LBM: 7/3 GI Imaging: 7/4 MR abdomen MRCP: pancreatic mass, vertebral metastases, pancolonic diverticulosis GI Surgeries / Procedures:  7/8 EGD 7/7 EUS, upper GI tract Central access: PICC placed 7/9 TPN start date: 7/9  Nutritional Goals: pending dietary assessment Goal TPN rate is 90 mL/hr (provides 104 g of protein and 2238 kcals per day)  RD Assessment: pending Estimated Needs Total Energy Estimated Needs: 2000-2300kcal/day Total Protein Estimated Needs: 100-115g/day Total Fluid Estimated Needs: 2.1-2.4L/day  Current Nutrition:  Clear liquid Boost/Resource Breeze TID - refused/not given   Plan: Continue TPN with lipids at 40 mL/hr at 1800 Electrolytes in TPN:  Na 80 mEq/L - increased K 50 mEq/L - increased Ca 8 mEq/L - increased Mg 5 mEq/L Phos 20 mmol/L -  increased Cl:Ac 1:1 Continue MVI with minerals one tablet po qday Continue thiamine  100 mg IV daily Continue very sensitive q6h SSI and adjust as needed  Continue NS 60 mL/hr Monitor TPN labs on Mon/Thurs, BMET with magnesium  & phosphorus qday x 3   Thank you for allowing pharmacy to be a part of this patient's care.  Eleanor EMERSON Agent, PharmD, BCPS Clinical Pharmacist [place] 12/29/2023 9:33 AM

## 2023-12-29 NOTE — Progress Notes (Signed)
 Occupational Therapy Treatment Patient Details Name: Chelsea Hansen MRN: [id number] DOB: [birth year] Today's Date: 12/29/2023   History of present illness Chelsea Hansen is a 53 yr old female who presented secondary to abdominal pain and recurrent emesis with concern for possible biliary process and with associated AKI. Imaging concerning for possible pancreatic lesion in addition to possible gallbladder pathology. PMH: metastatic breast cancer, duodenal stenosis, esophageal candidiasis, constipation, hyperlipidemia, hypertension, anemia, DVT, asthma    OT comments  The pt performed lower body dressing, sit to stand, and ambulating in the hall without an assistive device. She reported getting up to the bathroom independently this a.m.  She has demonstrated gradual functional progress and has subsequently met her OT goals. OT addressed all her questions/concerns regarding her occupational performance and self-care management in the home. She does not require further OT services. OT will sign off and recommend she return home at discharge.       If plan is discharge home, recommend the following:  Assistance with cooking/housework   Equipment Recommendations  None recommended by OT    Recommendations for Other Services      Precautions / Restrictions Restrictions Weight Bearing Restrictions Per Provider Order: No       Mobility Bed Mobility Overal bed mobility: Independent Bed Mobility: Supine to Sit     Supine to sit: Independent          Transfers Overall transfer level: Independent Equipment used: None Transfers: Sit to/from Stand Sit to Stand: Independent      Balance     Sitting balance-Leahy Scale: Normal       Standing balance-Leahy Scale: Good         ADL either performed or assessed with clinical judgement   ADL Overall ADL's : Independent;At baseline          Communication Communication Communication: No apparent difficulties   Cognition  Arousal: Alert Behavior During Therapy: WFL for tasks assessed/performed Cognition: No apparent impairments    OT - Cognition Comments: Oriented x4          Following commands: Intact                      Pertinent Vitals/ Pain       Pain Assessment Pain Assessment: 0-10 Pain Score: 5  Pain Location: stomach Pain Intervention(s): Monitored during session   Frequency   (N/A)        Progress Toward Goals   Progress towards OT goals: Goals met/education completed, patient discharged from OT            AM-PAC OT 6 Clicks Daily Activity     Outcome Measure   Help from another person eating meals?: None Help from another person taking care of personal grooming?: None Help from another person toileting, which includes using toliet, bedpan, or urinal?: None Help from another person bathing (including washing, rinsing, drying)?: None Help from another person to put on and taking off regular upper body clothing?: None Help from another person to put on and taking off regular lower body clothing?: None 6 Click Score: 24    End of Session Equipment Utilized During Treatment: Other (comment) (N/A)  OT Visit Diagnosis: Muscle weakness (generalized) (M62.81);Pain   Activity Tolerance Patient tolerated treatment well   Patient Left in bed;with call bell/phone within reach   Nurse Communication Mobility status        Time: 8984-8974 OT Time Calculation (min): 10 min  Charges: OT General Charges $OT  Visit: 1 Visit OT Treatments $Therapeutic Activity: 8-22 mins     Delanna LITTIE Molt, OTR/L 12/29/2023, 12:20 PM

## 2023-12-29 NOTE — Plan of Care (Signed)
  Problem: Education: Goal: Knowledge of General Education information will improve Description: Including pain rating scale, medication(s)/side effects and non-pharmacologic comfort measures Outcome: Progressing   Problem: Clinical Measurements: Goal: Diagnostic test results will improve Outcome: Progressing   Problem: Activity: Goal: Risk for activity intolerance will decrease Outcome: Progressing   Problem: Nutrition: Goal: Adequate nutrition will be maintained Outcome: Progressing   Problem: Coping: Goal: Level of anxiety will decrease Outcome: Progressing   Problem: Pain Managment: Goal: General experience of comfort will improve and/or be controlled Outcome: Progressing

## 2023-12-29 NOTE — Progress Notes (Signed)
 Progress Note   Patient: Chelsea Hansen FMW:[id number] DOB: 02/01/1971 DOA: 12/22/2023     6 DOS: the patient was seen and examined on 12/29/2023   Brief hospital course: Chelsea Hansen is a 53 y.o. female with a history of metastatic breast cancer, duodenal stenosis, esophageal candidiasis, constipation, hyperlipidemia, hypertension, anemia, DVT, asthma.  Patient presented secondary to abdominal pain and recurrent emesis with concern for possible biliary process and with associated AKI.  Imaging concerning for possible pancreatic lesion in addition to possible gallbladder pathology.  Patient started on IV fluids.  Gastroenterology consulted for recommendations. MRCP ordered and with findings concerning for primary pancreatic adenocarcinoma.   EUS attempted on 7/7, but unsuccessful secondary to duodenal stenosis.  EGD/EUS 12/28/2023.  Patient is being planned for surgical procedure for next week for nutritional management.  Nutrition team advised for TPN.  Patient to continue TPN.  Patient has a persistent nausea and vomiting.  Assistant by GI appreciated.  Assessment and Plan:   Nausea and Vomiting Present on admission.  Unclear etiology. History of candida esophagitis on fluconazole . Having bowel movements. Abdominal x-ray without evidence of obstruction. Still with recurrent emesis with scheduled compazine . Endoscopic procedure on 7/7 significant for significant retained gastric fluid with evidence of duodenal stenosis. - Continue Compazine  scheduled - Discontinue Ativan  scheduled - Continue Zofran  and Reglan  as needed - GI recommendations: S/p repeat endoscopy, has duodenal stenosis      Elevated AST/ALT Elevated Lipase Pancreatic mass with duodenal stricture/obstruction in the setting of metastatic breast cancer CT chest/abdomen/pelvis and right upper quadrant ultrasound obtained on admission and significant for evidence of stranding of the proximal pancreas with possible underlying vague  mass, borderline gallbladder wall thickening, with no evidence of cholelithiasis or pericholecystic fluid.  Gastroenterology was consulted on admission.  LFTs trending down. MRCP ordered and concerning for pancreatic adenocarcinoma. EUS failed secondary to duodenal stenosis - GI and general surgery on the case.  Surgery wants to take her to the OR next week with some improvement in nutritional status with TPN.   Candida esophagitis Patient diagnosed on 6/24. Patient has been on fluconazole  as an outpatient, but with recurrent emesis, has not received consistent dosing. Patient started on fluconazole . - Continue fluconazole  per GI recommendations   AKI on CKD IIIa Secondary to intractable nausea and vomiting with resultant hypovolemia. Baseline creatinine of about 1. Creatinine of 3.25 on admission. Patient started on IV fluids. - Continue IV fluids - Daily BMP  Hypokalemia Secondary to GI losses from recurrent emesis. - Potassium supplementation as needed  Hypocalcemia Low, even accounting for albumin. Calcium  gluconate given with improvement. Back down today. - Calcium  gluconate   Hypomagnesemia -Secondary to GI losses.  -Improved with magnesium  supplementation. -Continue magnesium  supplementation IV -Continue to monitor   Hypophosphatemia - Phosphorus supplementation   Acute on chronic anemia Baseline hemoglobin appears to be around 9-10. Hemoglobin normal at 12.7 on admission. This may be possibly related to hemoconcentration as she has drifted down towards baseline.  Hemoglobin down to 8.5 now. No episodes of bleeding noted. Hemoglobin stable today. - CBC in AM   Constipation - Continue MiraLAX    History of breast cancer Metastasis to bone Patient follows with Dr. [name] as an outpatient and is on anastrazole. She was previously on abemaciclib , which has been held.  CT this admission confirms metastatic osseous lesions.   Hyperlipidemia Noted. Patient is not on statin  therapy as an outpatient.   Chronic pain Patient is on Percocet and gabapentin  as an outpatient. -  Morphine  IV as needed while still having recurrent vomiting     DVT prophylaxis: SCDs     Subjective: Patient complains of persistent vomiting  Physical Exam: Vitals:   12/28/23 2008 12/28/23 2321 12/29/23 0402 12/29/23 1145  BP: 117/73 108/70 (!) 103/58 119/81  Pulse: 64 68 62 66  Resp: 18 18 16 16   Temp: 98.3 F (36.8 C) 98.5 F (36.9 C) 97.8 F (36.6 C) 98 F (36.7 C)  TempSrc: Oral Oral Oral Oral  SpO2: 99% 98% 98% 100%  Weight:      Height:       Constitutional: Alert, awake, calm, comfortable HEENT: Neck supple Respiratory: clear to auscultation bilaterally, no wheezing, no crackles. Normal respiratory effort. No accessory muscle use.  Cardiovascular: Regular rate and rhythm, no murmurs / rubs / gallops. No extremity edema. 2+ pedal pulses. No carotid bruits.  Abdomen: no tenderness, no masses palpated. No hepatosplenomegaly. Bowel sounds positive.  Musculoskeletal: no clubbing / cyanosis. No joint deformity upper and lower extremities. Good ROM, no contractures. Normal muscle tone.  Skin: no rashes, lesions, ulcers. No induration Neurologic: CN 2-12 grossly intact. Sensation intact, DTR normal. Strength 5/5 x all 4 extremities.  Psychiatric: Normal judgment and insight. Alert and oriented x 3. Normal mood.   Data Reviewed:  Potassium is 2.9, white count 6.7, hemoglobin 8.3  Family Communication: None available  Disposition: Status is: Inpatient Remains inpatient appropriate because: Ongoing persistent nausea and vomiting and requiring definitive treatment for duodenal obstruction and pancreatic mass.  Planned Discharge Destination: Barriers to discharge: Persistent ongoing nausea vomiting with duodenal obstruction    Time spent: 35 minutes  Author: Nena Rebel, MD 12/29/2023 4:58 PM  For on call review [email or web address].

## 2023-12-29 NOTE — Progress Notes (Signed)
 [facility] Progress Note  CC:  N/V, pancreatic mass   Subjective: She had nausea and vomiting from 3 AM to 10:30 AM this morning with central abdominal pain which has subsided after she received Reglan  and Morphine  at 9 AM and Zofran  at 1130 AM.  Objective:   EGD/EUS 12/28/2023:   EGD impression:  - No gross lesions in the proximal esophagus.  - LA Grade C esophagitis with no bleeding found distally (appears improved from prior).  - Z-line irregular, 39 cm from the incisors.  - 3 cm hiatal hernia.  - Retained gastric fluid. 900 cc removed on today's procedure.  - J-shaped and dilation deformity in the entire stomach. - Erythematous mucosa in the stomach. Previously biopsied and negative for HP.  - No gross lesions in the duodenal bulb.  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 Biopsied.    EUS impression:  - Pancreatic parenchymal abnormalities consisting of diffuse echogenicity and lobularity were noted in the pancreatic head, genu of the pancreas, pancreatic body and pancreatic tail. No overt mass/lesion was noted in this particular regions visualized. Uncinate process not able to be visualized.  - The pancreatic duct within the head was prominent and was dilated in the genu of the pancreas, body of the pancreas and tail of the pancreas.  - There was a normal-appearing common bile duct and prominent/slightly dilated common hepatic duct.  - No malignant-appearing lymph nodes were visualized in the celiac region (level 20), peripancreatic region and porta hepatis region.   EGD 12/27/2023: - No gross lesions in the proximal esophagus.  - LA Grade C esophagitis with no bleeding found distally.  - 2 cm hiatal hernia.  - Retained gastric fluid.  - Erythematous mucosa in the stomach. No change from yesterday.  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 Dilated under fluoroscopy up  to 12 mm. It is felt that this duodenal stenosis is greater than 5 cm in length. Unable to traverse even after dilation.    Vital signs in last 24 hours: Temp:  [97.8 F (36.6 C)-98.5 F (36.9 C)] 98 F (36.7 C) (07/10 1145) Pulse Rate:  [62-69] 66 (07/10 1145) Resp:  [16-18] 16 (07/10 1145) BP: (103-126)/(58-81) 119/81 (07/10 1145) SpO2:  [98 %-100 %] 100 % (07/10 1145) Last BM Date : 12/22/23 General: Fatigued appearing 53 year old female in no acute distress. Heart: Regular rate and rhythm, no murmurs. Pulm: Breath sounds clear throughout, decreased in the bases. Abdomen: Soft, mildly distended. Moderate generalized tenderness without rebound or guarding. Positive bowel sounds to all 4 quadrants.  Extremities:  No edema. Neurologic:  Alert and oriented x 4. Grossly normal neurologically. Psych:  Alert and cooperative. Normal mood and affect.  Intake/Output from previous day: 07/09 0701 - 07/10 0700 In: 4495.1 [P.O.:360; I.V.:3525.1; IV Piggyback:610] Out: -  Intake/Output this shift: Total I/O In: 446 [P.O.:240; I.V.:206] Out: 200 [Emesis/NG output:200]  Lab Results: Recent Labs    12/26/23 1903 12/29/23 0405  WBC 5.2 6.7  HGB 8.9* 8.3*  HCT 28.4* 25.9*  PLT 268 189   BMET Recent Labs    12/27/23 0410 12/28/23 0412 12/29/23 0405  NA 139 137 134*  K 3.3* 4.1 2.9*  CL 111 110 104  CO2 18* 18* 22  GLUCOSE 72 126* 129*  BUN 5* 8 9  CREATININE 0.74 0.69 0.73  CALCIUM  6.9* 7.5* 6.9*   LFT Recent Labs    12/29/23 0405  PROT 6.3*  ALBUMIN 3.0*  AST 76*  ALT 116*  ALKPHOS 333*  BILITOT 0.6   PT/INR No results for input(s): LABPROT, INR in the last 72 hours. Hepatitis Panel No results for input(s): HEPBSAG, HCVAB, HEPAIGM, HEPBIGM in the last 72 hours.  DG Abd Portable 1V Result Date: 12/28/2023 CLINICAL DATA:  Initial evaluation for acute abdominal pain. Vomiting. EXAM: PORTABLE ABDOMEN - 1 VIEW COMPARISON:  Radiograph from 12/24/2023.  FINDINGS: Mild gaseous distension of the stomach. Bowel gas pattern is nonobstructive. No appreciable bowel wall thickening. No soft tissue mass or abnormal calcification. No visible free air. Mild lumbar levoscoliosis with multilevel degenerative spondylosis. Visualized osseous structures demonstrate no acute finding. IMPRESSION: 1. Mild gaseous distension of the stomach. 2. Otherwise negative abdominal radiographs with overall nonobstructive bowel gas pattern. Electronically Signed   By: Morene Hoard M.D.   On: 12/28/2023 19:58   US  EKG SITE RITE Result Date: 12/27/2023 If Site Rite image not attached, placement could not be confirmed due to current cardiac rhythm.  DG C-Arm 1-60 Min-No Report Result Date: 12/27/2023 Fluoroscopy was utilized by the requesting physician.  No radiographic interpretation.   Patient Profile:  53 y.o. 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 intake and upper abdominal pain with weight loss with MRI showing a masslike appearance of the pancreatic head.   Assessment / Plan:  N/V, abdominal pain secondary to duodenal stenosis and suspected pancreatic head mass with elevated LFTs.  Total bili 0.6.  Alk phos 333. AST 76. ALT 116. CA 19-9 elevated at 465. 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 EGD 7/8 showed a significant amount of food in the stomach which required suctioning, a significant J-shaped and dilated deformity resulting in outlet obstruction, the  duodenal stenosis appeared to be more significant when compared to prior EGD which could not be traversed. EUS 7/8 did not show any evidence of a mass or lesion but the inferior aspect of the head and the uncinate process could not be visualized. Repeat EGD/EUS with fluoroscopy for dilatation of the duodenal stricture was attempted 7/9, the duodenal stenosis was dilated under fluoro up to 12mm but Dr. Wilhelmenia was unable to traverse the scope after dilatation. [facility] surgery consulted, planning on a diagnostic laparoscopy with possible GJ bypass with potential biopsy of any visible peritoneal nodules but no plans to directly biopsy the pancreas due to concerns of risk for significant bleeding or leak complications. PICC line placed and TPN started 7/9. Patient with persistent N/V with central abdominal pain from 3 AM until 10:30 AM.  No BM for at least the past 5 to 6 days.  Patient denies passing gas per the rectum for the past 2 days. Abd x-ray yesterday afternoon showed mild gaseous distention of the stomach without obstruction.  Hemodynamically stable. - CTAP with IV contrast  - Clear liquid diet - Continue TPN. - Pain management and IV fluids per the hospitalist - Continue PPI IV twice daily - Continue Carafate  1 g slurry every 6 hours - Schedule Reglan  5 mg IV every 6 hours and not as needed x 24  hours, monitor response - Continue Ondansetron  4 mg IV every 6 hours as needed  - Appreciate general surgery recommendations - Await further recommendations per Dr. Wilhelmenia   Microcytic anemia. Hg 8.5 -> 8.9 -> today Hg 8.3. No overt GI bleeding.   Recent diagnosis of esophagitis and candidiasis esophagitis - Continue PPI IV bid and Fluconazole  therapy   Hypokalemia and hypomagnesemia - Management per the hospitalist    Principal Problem:   AKI (acute kidney injury) (HCC) Active Problems:   Hypokalemia   HLD (hyperlipidemia)   Breast cancer metastasized to bone, right (HCC)    Constipation   Acute kidney injury superimposed on chronic kidney disease (HCC)   Dehydration   Prolonged QT interval   Hypocalcemia   Acute esophagitis   Candida esophagitis (HCC)   Transaminitis   Pancreatic mass   Right sided weakness   Hypotension   Abnormal CT of the abdomen   Elevated liver enzymes   Acute kidney injury (HCC)   Abnormal MRI of abdomen   Pancreatic lesion   Unintentional weight loss   Nausea and vomiting   Duodenal obstruction     LOS: 6 days   Chelsea Hansen  12/29/2023, 2:36 PM

## 2023-12-29 NOTE — Progress Notes (Signed)
 2 Days Post-Op  Subjective: Denies pain at present. Asking if she can eat solid food and then just throw up, says she feels hungry but also nauseated.   ROS: See above, otherwise other systems negative  Objective: Vital signs in last 24 hours: Temp:  [97.8 F (36.6 C)-98.5 F (36.9 C)] 97.8 F (36.6 C) (07/10 0402) Pulse Rate:  [62-69] 62 (07/10 0402) Resp:  [16-18] 16 (07/10 0402) BP: (103-126)/(58-81) 103/58 (07/10 0402) SpO2:  [98 %-100 %] 98 % (07/10 0402) Last BM Date : 12/22/23  Intake/Output from previous day: 07/09 0701 - 07/10 0700 In: 4495.1 [P.O.:360; I.V.:3525.1; IV Piggyback:610] Out: -  Intake/Output this shift: Total I/O In: 206 [I.V.:206] Out: -   PE: Gen: NAD Lungs: normal respiratory effort Abd: soft, not really distended, central/epigastric tenderness  Lab Results:  Recent Labs    12/26/23 1903 12/29/23 0405  WBC 5.2 6.7  HGB 8.9* 8.3*  HCT 28.4* 25.9*  PLT 268 189   BMET Recent Labs    12/28/23 0412 12/29/23 0405  NA 137 134*  K 4.1 2.9*  CL 110 104  CO2 18* 22  GLUCOSE 126* 129*  BUN 8 9  CREATININE 0.69 0.73  CALCIUM  7.5* 6.9*   PT/INR No results for input(s): LABPROT, INR in the last 72 hours. CMP     Component Value Date/Time   NA 134 (L) 12/29/2023 0405   NA 144 11/30/2023 1000   K 2.9 (L) 12/29/2023 0405   CL 104 12/29/2023 0405   CO2 22 12/29/2023 0405   GLUCOSE 129 (H) 12/29/2023 0405   BUN 9 12/29/2023 0405   BUN 19 11/30/2023 1000   CREATININE 0.73 12/29/2023 0405   CREATININE 3.25 (H) 12/22/2023 0957   CALCIUM  6.9 (L) 12/29/2023 0405   PROT 6.3 (L) 12/29/2023 0405   PROT 7.3 11/30/2023 1000   ALBUMIN 3.0 (L) 12/29/2023 0405   ALBUMIN 4.5 11/30/2023 1000   AST 76 (H) 12/29/2023 0405   AST 146 (H) 12/22/2023 0957   ALT 116 (H) 12/29/2023 0405   ALT 334 (HH) 12/22/2023 0957   ALKPHOS 333 (H) 12/29/2023 0405   BILITOT 0.6 12/29/2023 0405   BILITOT 0.9 12/22/2023 0957   GFRNONAA >60 12/29/2023 0405    GFRNONAA 17 (L) 12/22/2023 0957   GFRAA >60 09/07/2019 1340   Lipase     Component Value Date/Time   LIPASE 52 (H) 12/22/2023 2233       Studies/Results: DG Abd Portable 1V Result Date: 12/28/2023 CLINICAL DATA:  Initial evaluation for acute abdominal pain. Vomiting. EXAM: PORTABLE ABDOMEN - 1 VIEW COMPARISON:  Radiograph from 12/24/2023. FINDINGS: Mild gaseous distension of the stomach. Bowel gas pattern is nonobstructive. No appreciable bowel wall thickening. No soft tissue mass or abnormal calcification. No visible free air. Mild lumbar levoscoliosis with multilevel degenerative spondylosis. Visualized osseous structures demonstrate no acute finding. IMPRESSION: 1. Mild gaseous distension of the stomach. 2. Otherwise negative abdominal radiographs with overall nonobstructive bowel gas pattern. Electronically Signed   By: Morene Hoard M.D.   On: 12/28/2023 19:58   US  EKG SITE RITE Result Date: 12/27/2023 If Site Rite image not attached, placement could not be confirmed due to current cardiac rhythm.  DG C-Arm 1-60 Min-No Report Result Date: 12/27/2023 Fluoroscopy was utilized by the requesting physician.  No radiographic interpretation.    Anti-infectives: Anti-infectives (From admission, onward)    Start     Dose/Rate Route Frequency Ordered Stop   12/22/23 2230  fluconazole  (DIFLUCAN ) IVPB 200  mg        200 mg 100 mL/hr over 60 Minutes Intravenous Every 24 hours 12/22/23 2132          Assessment/Plan Pancreatic mass with duodenal stricture/obstruction in the setting of metastatic breast cancer -prealbumin is 13 surprisingly.  PICC/TNA ordered for nutritional support given her stricture and inability to get much or any nutrition from oral intake. -should remain on CLD and do not advance diet in order to avoid further vomiting -CA 19-9 is 465 -discussed case with [facility] surgeon.  Plan at this time is nutritional support this week/weekend with TNA and plan for bypass  surgery next week, possible g-tube, etc.   -no plans to biopsy the pancreas intra-operatively as that tends to lead to significant bleeding or leak complications.  If there are any  nodules, etc that are visible then would plan to biopsy these. -plan discussed with patient.  She is clearly upset about the timeline of events that we have discussed.  She is agreeable to this though.  FEN - CLD, TNA VTE - ok for chemical prophylaxis from our standpoint ID - diflucan   HTN HLD protein calorie nutrition - place PICC, start TNA.  Prealbumin 13 from 20 just 5 days ago asthma  I reviewed Consultant GI notes, hospitalist notes, last 24 h vitals and pain scores, last 48 h intake and output, last 24 h labs and trends, and last 24 h imaging results.   LOS: 6 days    Chelsea Hansen , [facility] 12/29/2023, 9:30 AM Please see Amion for pager number during day hours 7:00am-4:30pm or 7:00am -11:30am on weekends

## 2023-12-30 ENCOUNTER — Inpatient Hospital Stay (HOSPITAL_COMMUNITY): Payer: MEDICAID

## 2023-12-30 ENCOUNTER — Other Ambulatory Visit: Payer: Self-pay | Admitting: Pharmacy Technician

## 2023-12-30 ENCOUNTER — Other Ambulatory Visit (HOSPITAL_COMMUNITY): Payer: Self-pay

## 2023-12-30 DIAGNOSIS — K315 Obstruction of duodenum: Secondary | ICD-10-CM | POA: Diagnosis not present

## 2023-12-30 DIAGNOSIS — K869 Disease of pancreas, unspecified: Secondary | ICD-10-CM

## 2023-12-30 DIAGNOSIS — R1114 Bilious vomiting: Secondary | ICD-10-CM | POA: Diagnosis not present

## 2023-12-30 DIAGNOSIS — R634 Abnormal weight loss: Secondary | ICD-10-CM

## 2023-12-30 LAB — BASIC METABOLIC PANEL WITH GFR
Anion gap: 8 (ref 5–15)
BUN: 12 mg/dL (ref 6–20)
CO2: 22 mmol/L (ref 22–32)
Calcium: 6.8 mg/dL — ABNORMAL LOW (ref 8.9–10.3)
Chloride: 108 mmol/L (ref 98–111)
Creatinine, Ser: 0.74 mg/dL (ref 0.44–1.00)
GFR, Estimated: >60 mL/min (ref >60)
Glucose, Bld: 113 mg/dL — ABNORMAL HIGH (ref 70–99)
Potassium: 3.2 mmol/L — ABNORMAL LOW (ref 3.5–5.1)
Sodium: 138 mmol/L (ref 135–145)

## 2023-12-30 LAB — CBC
HCT: 26.8 % — ABNORMAL LOW (ref 36.0–46.0)
Hemoglobin: 8.2 g/dL — ABNORMAL LOW (ref 12.0–15.0)
MCH: 22.7 pg — ABNORMAL LOW (ref 26.0–34.0)
MCHC: 30.6 g/dL (ref 30.0–36.0)
MCV: 74.2 fL — ABNORMAL LOW (ref 80.0–100.0)
Platelets: 177 K/uL (ref 150–400)
RBC: 3.61 MIL/uL — ABNORMAL LOW (ref 3.87–5.11)
RDW: 15.5 % (ref 11.5–15.5)
WBC: 5.3 K/uL (ref 4.0–10.5)
nRBC: 0 % (ref 0.0–0.2)

## 2023-12-30 LAB — GLUCOSE, CAPILLARY
Glucose-Capillary: 115 mg/dL — ABNORMAL HIGH (ref 70–99)
Glucose-Capillary: 121 mg/dL — ABNORMAL HIGH (ref 70–99)
Glucose-Capillary: 130 mg/dL — ABNORMAL HIGH (ref 70–99)
Glucose-Capillary: 133 mg/dL — ABNORMAL HIGH (ref 70–99)
Glucose-Capillary: 95 mg/dL (ref 70–99)

## 2023-12-30 LAB — MAGNESIUM: Magnesium: 1.7 mg/dL (ref 1.7–2.4)

## 2023-12-30 LAB — PHOSPHORUS: Phosphorus: 2.3 mg/dL — ABNORMAL LOW (ref 2.5–4.6)

## 2023-12-30 MED ORDER — TRAVASOL 10 % IV SOLN
INTRAVENOUS | Status: AC
  Filled 2023-12-30: qty 691.2

## 2023-12-30 MED ORDER — SODIUM CHLORIDE 0.9 % IV SOLN: INTRAVENOUS | Status: AC

## 2023-12-30 MED ORDER — BISACODYL 10 MG RE SUPP
10.0000 mg | Freq: Once | RECTAL | Status: AC
  Administered 2023-12-30: 10 mg via RECTAL
  Filled 2023-12-30: qty 1

## 2023-12-30 MED ORDER — POTASSIUM PHOSPHATES 15 MMOLE/5ML IV SOLN
30.0000 mmol | Freq: Once | INTRAVENOUS | Status: AC
  Administered 2023-12-30: 30 mmol via INTRAVENOUS
  Filled 2023-12-30: qty 10

## 2023-12-30 MED ORDER — POTASSIUM CHLORIDE 10 MEQ/100ML IV SOLN
10.0000 meq | INTRAVENOUS | Status: AC
  Administered 2023-12-30 (×3): 10 meq via INTRAVENOUS
  Filled 2023-12-30 (×3): qty 100

## 2023-12-30 NOTE — Progress Notes (Signed)
 Patient ID: Chelsea Hansen, female   DOB: 02-09-1971, 53 y.o.   MRN: [id number]   She continues to have emesis. I have recommended a NG tube and she agrees to try

## 2023-12-30 NOTE — Plan of Care (Signed)

## 2023-12-30 NOTE — Progress Notes (Signed)
 PHARMACY - TOTAL PARENTERAL NUTRITION CONSULT NOTE   Indication: Pancreatic mass with duodenal stricture/obstruction  Patient Measurements: Height: 5' 11 (180.3 cm) Weight: 69.6 kg (153 lb 7 oz) IBW/kg (Calculated) : 70.8 TPN AdjBW (KG): 69.6 Body mass index is 21.4 kg/m. Usual Weight: 92.5 kg in April of this year  Assessment:  Pharmacy consulted to manage TPN for this 53 yo needing nutritional support while awaiting surgical intervention for pancreatic mass with duodenal stricture/obstruction in the setting of metastatic breast cancer resulting in poor oral intake, persistent nausea and vomiting,  and weight loss.  Glucose / Insulin :  no Hx DM. BG goal <180 - CBGs < 150, no insulin  / 24 hrs Electrolytes:   K low at 3.2, phos low at 2.3, CoCa low at 7.6, Mag 1.7 low/normal Renal: SCr and BUN WNL  Hepatic: alk phos elevated, AST/ALT elevated, Total Bilirubin WNL, Total Protein elevated, triglycerides elevated at 267 Intake / Output; MIVF:  MIVF: NS 60 ml/hr Output: Urine not recorded, Emesis 400 mL  LBM: 7/3 GI Imaging: 7/4 MR abdomen MRCP: pancreatic mass, vertebral metastases, pancolonic diverticulosis 7/10 CT: pancreatic head equivocal fullness, small amount free pelvic fluid, widespread osseous metastatic disease, small hiatal hernia, colonic diverticulosis GI Surgeries / Procedures:  7/8 EGD 7/7 EUS, upper GI tract  Central access: PICC placed 7/9 TPN start date: 7/9  Nutritional Goals: pending dietary assessment Goal TPN rate is 90 mL/hr (provides 104 g of protein and 2238 kcals per day)  RD Assessment: pending Estimated Needs Total Energy Estimated Needs: 2000-2300kcal/day Total Protein Estimated Needs: 100-115g/day Total Fluid Estimated Needs: 2.1-2.4L/day  Current Nutrition:  NPO Boost/Resource Breeze TID - refused/not given  Plan: Now: KPhos 30 mmol IV x1, KCl 10 mEq IV x3 doses  Advance TPN to 60 mL/hr at 1800 Electrolytes in TPN:  Na 80 mEq/L  K 60  mEq/L - increased  Ca 8 mEq/L  Mg 10 mEq/L - increased Phos 20 mmol/L  Cl:Ac 1:1 Add standard MVI and trace elements to TPN Move thiamine  100 mg IV daily to TPN Continue very sensitive q6h SSI and adjust as needed  Continue NS 40 mL/hr Monitor TPN labs on Mon/Thurs, BMET with magnesium  & phosphorus qday x 3   Thank you for allowing pharmacy to be a part of this patient's care.   Wanda Hasting PharmD, BCPS [facility] main pharmacy [phone] 12/30/2023 7:16 AM

## 2023-12-30 NOTE — Progress Notes (Signed)
 Progress Note   Patient: Chelsea Hansen FMW:[id number] DOB: [birth year] DOA: 12/22/2023     7 DOS: the patient was seen and examined on 12/30/2023   Brief hospital course: Chelsea Hansen is a 53 y.o. female with a history of metastatic breast cancer, duodenal stenosis, esophageal candidiasis, constipation, hyperlipidemia, hypertension, anemia, DVT, asthma.  Patient presented secondary to abdominal pain and recurrent emesis with concern for possible biliary process and with associated AKI.  Imaging concerning for possible pancreatic lesion in addition to possible gallbladder pathology.  Patient started on IV fluids.  Gastroenterology consulted for recommendations. MRCP ordered and with findings concerning for primary pancreatic adenocarcinoma.   EUS attempted on 7/7, but unsuccessful secondary to duodenal stenosis.  EGD/EUS 12/28/2023.  Patient is being planned for surgical procedure for next week for nutritional management.  Nutrition team advised for TPN.  Patient to continue TPN.  Patient has a persistent nausea and vomiting.  Assistant by GI appreciated.  Assessment and Plan: Nausea and Vomiting Present on admission.  Unclear etiology. History of candida esophagitis on fluconazole . Having bowel movements. Abdominal x-ray without evidence of obstruction. Still with recurrent emesis with scheduled compazine . Endoscopic procedure on 7/7 significant for significant retained gastric fluid with evidence of duodenal stenosis. - Continue Compazine  scheduled - Discontinue Ativan  scheduled - Continue Zofran  and Reglan  as needed, only ice chips/sips - GI recommendations: S/p repeat endoscopy, has duodenal stenosis       Elevated AST/ALT Elevated Lipase Pancreatic mass with duodenal stricture/obstruction in the setting of metastatic breast cancer CT chest/abdomen/pelvis and right upper quadrant ultrasound obtained on admission and significant for evidence of stranding of the proximal pancreas with possible  underlying vague mass, borderline gallbladder wall thickening, with no evidence of cholelithiasis or pericholecystic fluid.  Gastroenterology was consulted on admission.  LFTs trending down. MRCP ordered and concerning for pancreatic adenocarcinoma. EUS failed secondary to duodenal stenosis - GI and general surgery on the case.  Surgery wants to take her to the OR next week with some improvement in nutritional status with TPN.   Candida esophagitis Patient diagnosed on 6/24. Patient has been on fluconazole  as an outpatient, but with recurrent emesis, has not received consistent dosing. Patient started on fluconazole . - Continue fluconazole  per GI recommendations    AKI on CKD IIIa Secondary to intractable nausea and vomiting with resultant hypovolemia. Baseline creatinine of about 1. Creatinine of 3.25 on admission. Patient started on IV fluids. - Continue IV fluids - Daily BMP   Hypokalemia Secondary to GI losses from recurrent emesis. - Potassium supplementation as needed   Hypocalcemia Low, even accounting for albumin . Calcium  gluconate given with improvement. Back down today. - Calcium  gluconate   Hypomagnesemia -Secondary to GI losses.  -Improved with magnesium  supplementation. -Continue magnesium  supplementation IV -Continue to monitor   Hypophosphatemia - Phosphorus supplementation   Acute on chronic anemia Baseline hemoglobin appears to be around 9-10. Hemoglobin normal at 12.7 on admission. This may be possibly related to hemoconcentration as she has drifted down towards baseline.  Hemoglobin down to 8.5 now. No episodes of bleeding noted. Hemoglobin stable today. - CBC in AM   Constipation - Continue MiraLAX    History of breast cancer Metastasis to bone Patient follows with Dr. [name] as an outpatient and is on anastrazole. She was previously on abemaciclib , which has been held.  CT this admission confirms metastatic osseous lesions.   Hyperlipidemia Noted. Patient  is not on statin therapy as an outpatient.   Chronic pain Patient is on Percocet  and gabapentin  as an outpatient. - Morphine  IV as needed while still having recurrent vomiting     DVT prophylaxis: SCDs        Subjective: Patient is still complain some nausea but no vomiting as she has not eaten  Physical Exam: Vitals:   12/29/23 2124 12/30/23 0328 12/30/23 0851 12/30/23 1211  BP: 112/73 99/61 (!) 61/36 (!) 93/58  Pulse: 70 69 71   Resp: 16 17  18   Temp: 98.5 F (36.9 C) 98.3 F (36.8 C)  98.9 F (37.2 C)  TempSrc: Oral Oral  Oral  SpO2: 100% 97%  99%  Weight:      Height:       Constitutional: Alert, awake, calm, comfortable HEENT: Neck supple Respiratory: clear to auscultation bilaterally, no wheezing, no crackles. Normal respiratory effort. No accessory muscle use.  Cardiovascular: Regular rate and rhythm, no murmurs / rubs / gallops. No extremity edema. 2+ pedal pulses. No carotid bruits.  Abdomen: no tenderness, no masses palpated. No hepatosplenomegaly. Bowel sounds positive.  Musculoskeletal: no clubbing / cyanosis. No joint deformity upper and lower extremities. Good ROM, no contractures. Normal muscle tone.  Skin: no rashes, lesions, ulcers. No induration Neurologic: CN 2-12 grossly intact. Sensation intact, DTR normal. Strength 5/5 x all 4 extremities.  Psychiatric: Normal judgment and insight. Alert and oriented x 3. Normal mood.   Data Reviewed:  Potassium 3.2 hemoglobin 8.2 hematocrit 26.8  Family Communication: Not available  Disposition: Status is: Inpatient Remains inpatient appropriate because: Ongoing treatment for pancreatic mass obstruction  Planned Discharge Destination: Home with Home Health    Time spent: 35 minutes  Author: Nena Rebel, MD 12/30/2023 1:35 PM  For on call review [email or web address].

## 2023-12-30 NOTE — Progress Notes (Signed)
 Enrollement paused. Verzenio  currently on hold starting 12/05/23 (per OV w Morna Kendall, NP 12/22/23)

## 2023-12-30 NOTE — Progress Notes (Signed)
 3 Days Post-Op  Subjective: Denies pain at present. Was brought a soft diet this morning but did not eat it. Ongoing nausea and occasional emesis. Denies flatus/BM in one week.  ROS: See above, otherwise other systems negative  Objective: Vital signs in last 24 hours: Temp:  [98 F (36.7 C)-98.5 F (36.9 C)] 98.3 F (36.8 C) (07/11 0328) Pulse Rate:  [66-71] 71 (07/11 0851) Resp:  [16-17] 17 (07/11 0328) BP: (61-119)/(36-81) 61/36 (07/11 0851) SpO2:  [97 %-100 %] 97 % (07/11 0328) Last BM Date : 12/22/23  Intake/Output from previous day: 07/10 0701 - 07/11 0700 In: 1186 [P.O.:240; I.V.:846; IV Piggyback:100] Out: 400 [Emesis/NG output:400] Intake/Output this shift: No intake/output data recorded.  PE: Gen: NAD Lungs: normal respiratory effort Abd: soft, not really distended, central/epigastric tenderness  Lab Results:  Recent Labs    12/29/23 0405 12/30/23 0500  WBC 6.7 5.3  HGB 8.3* 8.2*  HCT 25.9* 26.8*  PLT 189 177   BMET Recent Labs    12/29/23 0405 12/30/23 0500  NA 134* 138  K 2.9* 3.2*  CL 104 108  CO2 22 22  GLUCOSE 129* 113*  BUN 9 12  CREATININE 0.73 0.74  CALCIUM  6.9* 6.8*   PT/INR No results for input(s): LABPROT, INR in the last 72 hours. CMP     Component Value Date/Time   NA 138 12/30/2023 0500   NA 144 11/30/2023 1000   K 3.2 (L) 12/30/2023 0500   CL 108 12/30/2023 0500   CO2 22 12/30/2023 0500   GLUCOSE 113 (H) 12/30/2023 0500   BUN 12 12/30/2023 0500   BUN 19 11/30/2023 1000   CREATININE 0.74 12/30/2023 0500   CREATININE 3.25 (H) 12/22/2023 0957   CALCIUM  6.8 (L) 12/30/2023 0500   PROT 6.3 (L) 12/29/2023 0405   PROT 7.3 11/30/2023 1000   ALBUMIN  3.0 (L) 12/29/2023 0405   ALBUMIN  4.5 11/30/2023 1000   AST 76 (H) 12/29/2023 0405   AST 146 (H) 12/22/2023 0957   ALT 116 (H) 12/29/2023 0405   ALT 334 (HH) 12/22/2023 0957   ALKPHOS 333 (H) 12/29/2023 0405   BILITOT 0.6 12/29/2023 0405   BILITOT 0.9 12/22/2023 0957    GFRNONAA >60 12/30/2023 0500   GFRNONAA 17 (L) 12/22/2023 0957   GFRAA >60 09/07/2019 1340   Lipase     Component Value Date/Time   LIPASE 52 (H) 12/22/2023 2233       Studies/Results: CT ABDOMEN PELVIS W CONTRAST Result Date: 12/29/2023 CLINICAL DATA:  Breast cancer. Epigastric pain. Pancreatic head lesions suspected on MRI, not well corroborated on endoscopy and endoscopic ultrasound. * Tracking Code: BO * EXAM: CT ABDOMEN AND PELVIS WITH CONTRAST TECHNIQUE: Multidetector CT imaging of the abdomen and pelvis was performed using the standard protocol following bolus administration of intravenous contrast. RADIATION DOSE REDUCTION: This exam was performed according to the departmental dose-optimization program which includes automated exposure control, adjustment of the mA and/or kV according to patient size and/or use of iterative reconstruction technique. CONTRAST:  OMNIPAQUE  IOHEXOL  300 MG/ML  SOLN COMPARISON:  Numerous exams including MRI abdomen 12/23/2023; CT scans from 12/22/2023, 12/10/2023, 12/06/2023, and 11/23/2023; and multiple PET scans including 11/24/2023 FINDINGS: Lower chest: Small type 1 hiatal hernia. Hepatobiliary: 3 mm simple cyst in the right hepatic lobe on image 32 series 2, as shown on recent MRI. Gallbladder unremarkable. CBD 5 mm in diameter, within normal limits. Pancreas: Equivocal fullness along the right side of the pancreatic head without abnormal hypoenhancement currently.  Borderline prominent dorsal pancreatic duct in the pancreatic body. Indistinct margins of the pancreatic head with the adjacent duodenum, potentially from local inflammatory process, difficult to confidently exclude the possibility of an infiltrative lesion. Slightly kinked appearance at the junction of the descending and transverse duodenum at the site where further passing of the endoscope was not feasible. Stricturing of the duodenum in this vicinity is not excluded. Poor definition of  the ampullary region. This area was not overtly hypermetabolic on the PET-CT from 1 month ago. Spleen: Unremarkable Adrenals/Urinary Tract: Tiny renal cysts or no further imaging workup. Adrenal glands normal. Stomach/Bowel: As noted above, there is potentially a duodenal stricture at the junction of the second and third portions with poor definition of fat planes between the pancreatic head in this segment of the duodenum Colonic diverticulosis.  No findings of active diverticulitis. Vascular/Lymphatic: Unremarkable.  No porta hepatis adenopathy. Reproductive: Uterus absent.  Adnexa unremarkable. Other: Small amount of free pelvic fluid. Musculoskeletal: Widespread sclerotic lesions in the skeleton compatible with osseous metastatic disease. Stable remote L3 compression fracture. IMPRESSION: 1. Equivocal fullness along the right side of the pancreatic head without abnormal hypoenhancement currently. Ill definition of borders between the pancreatic head and adjacent portion of the duodenum. I do not see a distinct mass and PET scan was negative in this region 1 month ago. As such, the current appearance could be inflammatory or postinflammatory in nature, correlate with lipase levels. Given the somewhat equivocal findings on MRI and endoscopic ultrasound, surveillance imaging may be indicated. 2. Small amount of free pelvic fluid. 3. Widespread sclerotic lesions in the skeleton compatible with osseous metastatic disease. 4. Small type 1 hiatal hernia. 5. Colonic diverticulosis. Electronically Signed   By: Ryan Salvage M.D.   On: 12/29/2023 15:56   DG Abd Portable 1V Result Date: 12/28/2023 CLINICAL DATA:  Initial evaluation for acute abdominal pain. Vomiting. EXAM: PORTABLE ABDOMEN - 1 VIEW COMPARISON:  Radiograph from 12/24/2023. FINDINGS: Mild gaseous distension of the stomach. Bowel gas pattern is nonobstructive. No appreciable bowel wall thickening. No soft tissue mass or abnormal calcification. No  visible free air. Mild lumbar levoscoliosis with multilevel degenerative spondylosis. Visualized osseous structures demonstrate no acute finding. IMPRESSION: 1. Mild gaseous distension of the stomach. 2. Otherwise negative abdominal radiographs with overall nonobstructive bowel gas pattern. Electronically Signed   By: Morene Hoard M.D.   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  PICC/TNA ordered for nutritional support given her stricture and inability to get much or any nutrition from oral intake. -should remain on sips/chips and do not advance diet in order to avoid further vomiting -CA 19-9 is 465 -discussed case with [facility] surgeon.  Plan at this time is nutritional support this week/weekend with TNA and plan for bypass surgery next week, possible g-tube, etc.   -no plans to biopsy the pancreas intra-operatively as that tends to lead to significant bleeding or leak complications.  If there are any  nodules, etc that are visible then would plan to biopsy these. -plan discussed with patient.  She is clearly upset about  the timeline of events that we have discussed.  She is agreeable to this though.  FEN - sips/chips, TNA VTE - ok for chemical prophylaxis from our standpoint, hgb stable the last 4 days ID - diflucan   HTN HLD protein calorie nutrition - place PICC, TNA.  Prealbumin 13 from 20 just 5 days ago asthma  I reviewed Consultant GI notes, hospitalist notes, last 24 h vitals and pain scores, last 48 h intake and output, last 24 h labs and trends, and last 24 h imaging results.   LOS: 7 days     Chelsea Hansen , [facility] Surgery 12/30/2023, 10:01 AM Please see Amion for pager number during day hours 7:00am-4:30pm or 7:00am -11:30am on weekends

## 2023-12-30 NOTE — Progress Notes (Signed)
 Mobility Specialist - Progress Note  During mobility: 71 bpm HR, 61/36 mmHg (45 MAP) BP, (Sitting EOB) Post-mobility: 62 bpm HR, 91/60 mmHg (71 MAP) BP, (Supine)   12/30/23 0851  Mobility  Activity Ambulated independently in room  Level of Assistance Contact guard assist, steadying assist  Assistive Device Other (Comment) (IV Pole)  Distance Ambulated (ft) 10 ft  Range of Motion/Exercises Active  Activity Response Tolerated poorly  Mobility Referral Yes  Mobility visit 1 Mobility  Mobility Specialist Start Time (ACUTE ONLY) [id number]  Mobility Specialist Stop Time (ACUTE ONLY) 0851  Mobility Specialist Time Calculation (min) (ACUTE ONLY) 16 min   Pt was found in bench in room and agreeable to ambulate. C/o nausea and stated feeling dizzy , seeing spots. Immediately sat EOB after a couple of steps. BP taken and recorded above. RN notified and entered room. Pt to supine position in bed and retook BP. At EOS was left with all needs met. Call bell in reach.  Erminio Leos,  Mobility Specialist Can be reached via Secure Chat

## 2023-12-31 DIAGNOSIS — N179 Acute kidney failure, unspecified: Secondary | ICD-10-CM | POA: Diagnosis not present

## 2023-12-31 LAB — GLUCOSE, CAPILLARY
Glucose-Capillary: 126 mg/dL — ABNORMAL HIGH (ref 70–99)
Glucose-Capillary: 117 mg/dL — ABNORMAL HIGH (ref 70–99)
Glucose-Capillary: 110 mg/dL — ABNORMAL HIGH (ref 70–99)

## 2023-12-31 LAB — BASIC METABOLIC PANEL WITH GFR
Anion gap: 7 (ref 5–15)
BUN: 12 mg/dL (ref 6–20)
CO2: 22 mmol/L (ref 22–32)
Calcium: 6.8 mg/dL — ABNORMAL LOW (ref 8.9–10.3)
Chloride: 107 mmol/L (ref 98–111)
Creatinine, Ser: 0.66 mg/dL (ref 0.44–1.00)
GFR, Estimated: >60 mL/min (ref >60)
Glucose, Bld: 116 mg/dL — ABNORMAL HIGH (ref 70–99)
Potassium: 3.8 mmol/L (ref 3.5–5.1)
Sodium: 136 mmol/L (ref 135–145)

## 2023-12-31 LAB — PHOSPHORUS: Phosphorus: 3.1 mg/dL (ref 2.5–4.6)

## 2023-12-31 LAB — CBC
HCT: 26.8 % — ABNORMAL LOW (ref 36.0–46.0)
Hemoglobin: 8.3 g/dL — ABNORMAL LOW (ref 12.0–15.0)
MCH: 23.1 pg — ABNORMAL LOW (ref 26.0–34.0)
MCHC: 31 g/dL (ref 30.0–36.0)
MCV: 74.4 fL — ABNORMAL LOW (ref 80.0–100.0)
Platelets: 182 K/uL (ref 150–400)
RBC: 3.6 MIL/uL — ABNORMAL LOW (ref 3.87–5.11)
RDW: 15.7 % — ABNORMAL HIGH (ref 11.5–15.5)
WBC: 4.7 K/uL (ref 4.0–10.5)
nRBC: 0 % (ref 0.0–0.2)

## 2023-12-31 LAB — MAGNESIUM: Magnesium: 1.6 mg/dL — ABNORMAL LOW (ref 1.7–2.4)

## 2023-12-31 MED ORDER — DIAZEPAM 5 MG/ML IJ SOLN
5.0000 mg | Freq: Four times a day (QID) | INTRAMUSCULAR | Status: DC | PRN
  Administered 2023-12-31 – 2024-01-06 (×3): 5 mg via INTRAVENOUS
  Filled 2023-12-31 (×4): qty 2

## 2023-12-31 MED ORDER — DIAZEPAM 5 MG/ML IJ SOLN: 2.5000 mg | Freq: Four times a day (QID) | INTRAMUSCULAR | Status: DC | PRN

## 2023-12-31 MED ORDER — TRAVASOL 10 % IV SOLN
INTRAVENOUS | Status: AC
  Filled 2023-12-31: qty 1036.8

## 2023-12-31 MED ORDER — MAGNESIUM SULFATE 2 GM/50ML IV SOLN
2.0000 g | Freq: Once | INTRAVENOUS | Status: AC
  Administered 2023-12-31: 2 g via INTRAVENOUS
  Filled 2023-12-31: qty 50

## 2023-12-31 MED ORDER — SODIUM CHLORIDE 0.9 % IV SOLN: INTRAVENOUS | Status: AC

## 2023-12-31 NOTE — Plan of Care (Signed)

## 2023-12-31 NOTE — Progress Notes (Signed)
 Progress Note   Patient: Chelsea Hansen FMW:[id number] DOB: 1971-01-06 DOA: 12/22/2023     8 DOS: the patient was seen and examined on 12/31/2023 at 10:45AM      Brief hospital course: 53 y.o. 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  Imaging showed a duodenal obstruction and masslike area of the pancreas.  GI and General Surgery consulted.    EUS attempted on 7/7, but unsuccessful secondary to duodenal stenosis.    Repeat EGD/EUS on 7/9 again unsuccessful in biopsy.  Gen surg consulted and current plan is for NG tube, TPN and possible surgery next week       Assessment and Plan:  Pancreatic mass with duodenal stricture Small bowel obstruction in setting of pancreatic mass This is a new mass on current imaging in the pancreas.  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  Unfortunately, EUS is the only way to biopsy the pancreas head mass, and this is negative twice.  General surgery will plan for intestinal bypass to relieve obstruction on Monday or Tuesday.  During that surgery if any lymph nodes or peritoneal implants are present, they will biopsy.  More likely, there will be nothing to biopsy intraoperatively.  In that case, we will need to focus on relief of obstruction first, and follow the pancreas mass with imaging after discharge to develop a plan for management of the mass. -Maintain NG tube - TPN - Consult General Surgery, plan for bypass surgery on Mon/Tues with Dr. Fern  - Consult Oncology - Consult GI     Hypertension Blood pressure controlled - Hold home propranolol   Moderate protein calorie malnutrition -Continue TPN  Chronic asthma -Hold home Singulair  until able to take p.o.  Candida esophagitis -Continue IV fluconazole  per GI  AKI on CKD stage IIIa Creatinine 3.2 on  admission, resolved to baseline 0.6 with fluids  Metastatic breast cancer Chronic pain syndrome - Hold home anastrozole  and Verzenio  until able to take p.o. - Continue fentanyl  and IV Valium  for pain and anxiety control  Acute on chronic anemia Due to CKD and cancer  Hypomagnesemia Hypokalemia Hypophosphatemia Hypocalcemia -Supplement magnesium  today      Subjective: Patient is curious about timing of surgery.  She has diffuse abdominal pain and she is anxious.     Physical Exam: BP 113/72 (BP Location: Left Arm)   Pulse 73   Temp 98.2 F (36.8 C)   Resp 16   Ht 5' 11 (1.803 m)   Wt 69.4 kg   SpO2 100%   BMI 21.34 kg/m   Adult female, sitting up in bed, NG tube in place RRR, no murmurs, no peripheral edema Respiratory rate normal, lungs clear without rales or wheezes Abdomen with discomfort throughout, no acute distress.  Data Reviewed: Discussed with general surgery and GI Basic metabolic panel shows hypomagnesemia CBC shows anemia, resolution of leukocytosis    Family Communication: Mother at the bedside    Disposition: Status is: Inpatient 53 year old female with metastatic breast cancer and what appears to be new pancreatic cancer  Oncology, GI, and general surgery are consulted and have seen the patient  Unfortunately the mass is not biopsy able, and is causing obstruction  For now we will focus on relief of the obstruction of the bypass surgery on Monday or Tuesday.  If no biopsy is possible during that surgery, will defer  to oncology what future imaging can help delineate the nature of the pancreatic mass        Author: Lonni SHAUNNA Dalton, MD 12/31/2023 6:21 PM  For on call review [email or web address].

## 2023-12-31 NOTE — Progress Notes (Signed)
 PHARMACY - TOTAL PARENTERAL NUTRITION CONSULT NOTE   Indication: Pancreatic mass with duodenal stricture/obstruction  Patient Measurements: Height: 5' 11 (180.3 cm) Weight: 69.4 kg (153 lb) IBW/kg (Calculated) : 70.8 TPN AdjBW (KG): 69.6 Body mass index is 21.34 kg/m. Usual Weight: 92.5 kg in April of this year  Assessment:  Pharmacy consulted to manage TPN for this 53 yo needing nutritional support while awaiting surgical intervention for pancreatic mass with duodenal stricture/obstruction in the setting of metastatic breast cancer resulting in poor oral intake, persistent nausea and vomiting,  and weight loss.  Glucose / Insulin :  no Hx DM. BG goal <180 - CBGs < 150, no insulin  / 24 hrs Electrolytes: CoCa low at 7.6, Mag 1.6 slightly low, all other lytes WNL Renal: SCr and BUN WNL  Hepatic: 7/10 labs: alk phos elevated, AST/ALT elevated, Total Bilirubin WNL, Total Protein elevated, triglycerides elevated at 267 Intake / Output; MIVF:  NGT placed 7/11 MIVF: NS 60 ml/hr Output: Urine , Emesis/NG 900 mL  LBM: 7/11 GI Imaging: 7/4 MR abdomen MRCP: pancreatic mass, vertebral metastases, pancolonic diverticulosis 7/10 CT: pancreatic head equivocal fullness, small amount free pelvic fluid, widespread osseous metastatic disease, small hiatal hernia, colonic diverticulosis GI Surgeries / Procedures:  7/8 EGD 7/7 EUS, upper GI tract  Central access: PICC placed 7/9 TPN start date: 7/9  Nutritional Goals: pending dietary assessment Goal TPN rate is 90 mL/hr (provides 104 g of protein and 2238 kcals per day)  RD Assessment: pending Estimated Needs Total Energy Estimated Needs: 2000-2300kcal/day Total Protein Estimated Needs: 100-115g/day Total Fluid Estimated Needs: 2.1-2.4L/day  Current Nutrition:  NPO  Plan: Now: Mg 2g IV x1  Advance TPN to goal rate of 90 mL/hr at 1800 Electrolytes in TPN:  Na 80 mEq/L  K 50 mEq/L - decreased given improvement from yesterday and  increase in TPN rate today Ca 8 mEq/L Mg 8 mEq/L - decreased given supplementation outside of TPN and increase in TPN rate today Phos 20 mmol/L Cl:Ac 1:1 Add standard MVI and trace elements to TPN Thiamine  100 mg IV daily in TPN Continue very sensitive q6h SSI and adjust as needed  Decrease NS to 30 mL/hr Monitor TPN labs on Mon/Thurs, BMET with magnesium  & phosphorus qday x 3   Thank you for allowing pharmacy to be a part of this patient's care.   Lacinda Moats, PharmD Clinical Pharmacist  7/12/20258:28 AM

## 2023-12-31 NOTE — Progress Notes (Signed)
 4 Days Post-Op   Subjective/Chief Complaint: Better with ng tube   Objective: Vital signs in last 24 hours: Temp:  [98.7 F (37.1 C)-98.9 F (37.2 C)] 98.8 F (37.1 C) (07/12 0209) Pulse Rate:  [62-78] 74 (07/12 0209) Resp:  [17-18] 17 (07/12 0209) BP: (91-115)/(58-76) 100/69 (07/12 0209) SpO2:  [97 %-100 %] 97 % (07/12 0209) Weight:  [69.4 kg] 69.4 kg (07/12 0500) Last BM Date : 12/30/23  Intake/Output from previous day: 07/11 0701 - 07/12 0700 In: 1858.7 [I.V.:1027.9; IV Piggyback:830.8] Out: 1800 [Urine:900; Emesis/NG output:900] Intake/Output this shift: Total I/O In: -  Out: 150 [Emesis/NG output:150]  Ab soft minimally tender  Lab Results:  Recent Labs    12/30/23 0500 12/31/23 0405  WBC 5.3 4.7  HGB 8.2* 8.3*  HCT 26.8* 26.8*  PLT 177 182   BMET Recent Labs    12/30/23 0500 12/31/23 0405  NA 138 136  K 3.2* 3.8  CL 108 107  CO2 22 22  GLUCOSE 113* 116*  BUN 12 12  CREATININE 0.74 0.66  CALCIUM  6.8* 6.8*   PT/INR No results for input(s): LABPROT, INR in the last 72 hours. ABG No results for input(s): PHART, HCO3 in the last 72 hours.  Invalid input(s): PCO2, PO2  Studies/Results: DG Abd Portable 1V Result Date: 12/30/2023 CLINICAL DATA:  NG tube placement EXAM: PORTABLE ABDOMEN - 1 VIEW COMPARISON:  CT abdomen pelvis 12/29/2023 FINDINGS: Enteric tube tip and side-port in the stomach. Gaseous distention of loops of bowel in the left upper abdomen similar to prior CT. IMPRESSION: Enteric tube tip and side-port in the stomach. Electronically Signed   By: Norman Gatlin M.D.   On: 12/30/2023 17:38   CT ABDOMEN PELVIS W CONTRAST Result Date: 12/29/2023 CLINICAL DATA:  Breast cancer. Epigastric pain. Pancreatic head lesions suspected on MRI, not well corroborated on endoscopy and endoscopic ultrasound. * Tracking Code: BO * EXAM: CT ABDOMEN AND PELVIS WITH CONTRAST TECHNIQUE: Multidetector CT imaging of the abdomen and pelvis was  performed using the standard protocol following bolus administration of intravenous contrast. RADIATION DOSE REDUCTION: This exam was performed according to the departmental dose-optimization program which includes automated exposure control, adjustment of the mA and/or kV according to patient size and/or use of iterative reconstruction technique. CONTRAST:  OMNIPAQUE  IOHEXOL  300 MG/ML  SOLN COMPARISON:  Numerous exams including MRI abdomen 12/23/2023; CT scans from 12/22/2023, 12/10/2023, 12/06/2023, and 11/23/2023; and multiple PET scans including 11/24/2023 FINDINGS: Lower chest: Small type 1 hiatal hernia. Hepatobiliary: 3 mm simple cyst in the right hepatic lobe on image 32 series 2, as shown on recent MRI. Gallbladder unremarkable. CBD 5 mm in diameter, within normal limits. Pancreas: Equivocal fullness along the right side of the pancreatic head without abnormal hypoenhancement currently. Borderline prominent dorsal pancreatic duct in the pancreatic body. Indistinct margins of the pancreatic head with the adjacent duodenum, potentially from local inflammatory process, difficult to confidently exclude the possibility of an infiltrative lesion. Slightly kinked appearance at the junction of the descending and transverse duodenum at the site where further passing of the endoscope was not feasible. Stricturing of the duodenum in this vicinity is not excluded. Poor definition of the ampullary region. This area was not overtly hypermetabolic on the PET-CT from 1 month ago. Spleen: Unremarkable Adrenals/Urinary Tract: Tiny renal cysts or no further imaging workup. Adrenal glands normal. Stomach/Bowel: As noted above, there is potentially a duodenal stricture at the junction of the second and third portions with poor definition of fat planes  between the pancreatic head in this segment of the duodenum Colonic diverticulosis.  No findings of active diverticulitis. Vascular/Lymphatic: Unremarkable.  No porta  hepatis adenopathy. Reproductive: Uterus absent.  Adnexa unremarkable. Other: Small amount of free pelvic fluid. Musculoskeletal: Widespread sclerotic lesions in the skeleton compatible with osseous metastatic disease. Stable remote L3 compression fracture. IMPRESSION: 1. Equivocal fullness along the right side of the pancreatic head without abnormal hypoenhancement currently. Ill definition of borders between the pancreatic head and adjacent portion of the duodenum. I do not see a distinct mass and PET scan was negative in this region 1 month ago. As such, the current appearance could be inflammatory or postinflammatory in nature, correlate with lipase levels. Given the somewhat equivocal findings on MRI and endoscopic ultrasound, surveillance imaging may be indicated. 2. Small amount of free pelvic fluid. 3. Widespread sclerotic lesions in the skeleton compatible with osseous metastatic disease. 4. Small type 1 hiatal hernia. 5. Colonic diverticulosis. Electronically Signed   By: Ryan Salvage M.D.   On: 12/29/2023 15:56    Anti-infectives: Anti-infectives (From admission, onward)    Start     Dose/Rate Route Frequency Ordered Stop   12/22/23 2230  fluconazole  (DIFLUCAN ) IVPB 200 mg        200 mg 100 mL/hr over 60 Minutes Intravenous Every 24 hours 12/22/23 2132         Assessment/Plan: Pancreatic mass with duodenal stricture/obstruction in the setting of metastatic breast cancer -prealbumin is 13 surprisingly.  PICC/TNA ordered for nutritional support given her stricture and inability to get much or any nutrition from oral intake. -should remain on sips/chips and do not advance diet in order to avoid further vomiting -CA 19-9 is 465 -discussed case with [facility] surgeon.  Plan at this time is nutritional support this week/weekend with TPN and plan for bypass surgery next week, possible g-tube, etc.   -no plans to biopsy the pancreas intra-operatively as that tends to lead to significant  bleeding or leak complications.  If there are any  nodules, etc that are visible then would plan to biopsy these.    FEN - sips/chips, TPN VTE - ok for chemical prophylaxis from our standpoint, hgb stable the last 4 days ID - diflucan    HTN HLD protein calorie nutrition - place PICC, TNA.  Prealbumin 13 from 20 just 5 days ago asthma  I reviewed hospitalist notes, last 24 h vitals and pain scores, last 48 h intake and output, last 24 h labs and trends, and last 24 h imaging results.   Chelsea Hansen 12/31/2023

## 2023-12-31 NOTE — Progress Notes (Signed)
 Pt had increased pain during shift requiring pain med q 3 hrs Fentanyl  more effective than Morphine  per pt update. Fentanyl  50 mcg for 10++/10 pain in abd, back and down spine area and discomfort in throat. No nausea and VS stable. SRP, RN

## 2024-01-01 ENCOUNTER — Ambulatory Visit: Payer: Self-pay | Admitting: Gastroenterology

## 2024-01-01 DIAGNOSIS — K869 Disease of pancreas, unspecified: Secondary | ICD-10-CM | POA: Diagnosis not present

## 2024-01-01 LAB — BASIC METABOLIC PANEL WITH GFR
Anion gap: 7 (ref 5–15)
BUN: 16 mg/dL (ref 6–20)
CO2: 22 mmol/L (ref 22–32)
Calcium: 7.2 mg/dL — ABNORMAL LOW (ref 8.9–10.3)
Chloride: 105 mmol/L (ref 98–111)
Creatinine, Ser: 0.67 mg/dL (ref 0.44–1.00)
GFR, Estimated: >60 mL/min (ref >60)
Glucose, Bld: 133 mg/dL — ABNORMAL HIGH (ref 70–99)
Potassium: 4.5 mmol/L (ref 3.5–5.1)
Sodium: 134 mmol/L — ABNORMAL LOW (ref 135–145)

## 2024-01-01 LAB — CBC
HCT: 27.8 % — ABNORMAL LOW (ref 36.0–46.0)
Hemoglobin: 8.7 g/dL — ABNORMAL LOW (ref 12.0–15.0)
MCH: 23.5 pg — ABNORMAL LOW (ref 26.0–34.0)
MCHC: 31.3 g/dL (ref 30.0–36.0)
MCV: 75.1 fL — ABNORMAL LOW (ref 80.0–100.0)
Platelets: 194 K/uL (ref 150–400)
RBC: 3.7 MIL/uL — ABNORMAL LOW (ref 3.87–5.11)
RDW: 15.7 % — ABNORMAL HIGH (ref 11.5–15.5)
WBC: 5.2 K/uL (ref 4.0–10.5)
nRBC: 0.4 % — ABNORMAL HIGH (ref 0.0–0.2)

## 2024-01-01 LAB — GLUCOSE, CAPILLARY
Glucose-Capillary: 124 mg/dL — ABNORMAL HIGH (ref 70–99)
Glucose-Capillary: 124 mg/dL — ABNORMAL HIGH (ref 70–99)
Glucose-Capillary: 132 mg/dL — ABNORMAL HIGH (ref 70–99)
Glucose-Capillary: 139 mg/dL — ABNORMAL HIGH (ref 70–99)

## 2024-01-01 LAB — PHOSPHORUS: Phosphorus: 2.7 mg/dL (ref 2.5–4.6)

## 2024-01-01 LAB — MAGNESIUM: Magnesium: 2.2 mg/dL (ref 1.7–2.4)

## 2024-01-01 MED ORDER — DIPHENHYDRAMINE HCL 12.5 MG/5ML PO ELIX: 12.5000 mg | ORAL_SOLUTION | Freq: Four times a day (QID) | ORAL | Status: DC | PRN

## 2024-01-01 MED ORDER — NALOXONE HCL 0.4 MG/ML IJ SOLN: 0.4000 mg | INTRAMUSCULAR | Status: DC | PRN

## 2024-01-01 MED ORDER — FENTANYL 50 MCG/ML IV PCA SOLN
INTRAVENOUS | Status: DC
  Administered 2024-01-01: 165 ug via INTRAVENOUS
  Administered 2024-01-01: 120 ug via INTRAVENOUS
  Administered 2024-01-02: 30 ug via INTRAVENOUS
  Administered 2024-01-02: 15 ug via INTRAVENOUS
  Administered 2024-01-02: 75 ug via INTRAVENOUS
  Administered 2024-01-02: 30 ug/h via INTRAVENOUS
  Administered 2024-01-03: 15 ug via INTRAVENOUS
  Administered 2024-01-03: 195 ug via INTRAVENOUS
  Administered 2024-01-03: 45 ug via INTRAVENOUS
  Administered 2024-01-04: 15 ug via INTRAVENOUS
  Administered 2024-01-04: 60 ug via INTRAVENOUS
  Administered 2024-01-04: 15 ug via INTRAVENOUS
  Administered 2024-01-04: 60 ug via INTRAVENOUS
  Administered 2024-01-04: 30 ug via INTRAVENOUS
  Administered 2024-01-05: 150 ug via INTRAVENOUS
  Filled 2024-01-01: qty 25

## 2024-01-01 MED ORDER — TRAVASOL 10 % IV SOLN
INTRAVENOUS | Status: AC
  Filled 2024-01-01: qty 1036.8

## 2024-01-01 MED ORDER — PHENOL 1.4 % MT LIQD
1.0000 | OROMUCOSAL | Status: DC | PRN
  Filled 2024-01-01: qty 177

## 2024-01-01 MED ORDER — SODIUM CHLORIDE 0.9% FLUSH: 9.0000 mL | INTRAVENOUS | Status: DC | PRN

## 2024-01-01 MED ORDER — DIPHENHYDRAMINE HCL 50 MG/ML IJ SOLN: 12.5000 mg | Freq: Four times a day (QID) | INTRAMUSCULAR | Status: DC | PRN

## 2024-01-01 MED ORDER — GUAIFENESIN-DM 100-10 MG/5ML PO SYRP
5.0000 mL | ORAL_SOLUTION | ORAL | Status: DC | PRN
  Administered 2024-01-01 – 2024-01-02 (×2): 5 mL via ORAL
  Filled 2024-01-01 (×2): qty 10

## 2024-01-01 NOTE — Progress Notes (Signed)
 Progress Note   Patient: Chelsea Hansen FMW:[id number] DOB: 1971-01-30 DOA: 12/22/2023     9 DOS: the patient was seen and examined on 01/01/2024 at 10:45AM      Brief hospital course: 53 y.o. 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  Imaging showed a duodenal obstruction and masslike area of the pancreas.  GI and General Surgery consulted.    EUS attempted on 7/7, but unsuccessful secondary to duodenal stenosis.    Repeat EGD/EUS on 7/9 again unsuccessful in biopsy.  Gen surg consulted and current plan is for NG tube, TPN and possible surgery next week       Assessment and Plan:  Pancreatic mass with duodenal stricture Small bowel obstruction in setting of pancreatic mass This is a new mass on current imaging in the pancreas.  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  Unfortunately, EUS is the only way to biopsy the pancreas head mass, and this is negative twice.  General surgery will plan for intestinal bypass to relieve obstruction on Monday or Tuesday.  During that surgery if any lymph nodes or peritoneal implants are present, they will biopsy.  More likely, there will be nothing to biopsy intraoperatively.  In that case, we will need to focus on relief of obstruction first, and follow the pancreas mass with imaging after discharge to develop a plan for management of the mass. -Maintain NG tube - TPN --Pain control with PCA pump  - Consult General Surgery, plan for bypass surgery on Mon/Tues with Dr. Fern - Consult Oncology - Consult GI   Hypertension Blood pressure controlled - Hold home propranolol   Moderate protein calorie malnutrition -Continue TPN  Chronic asthma -Hold home Singulair  until able to take p.o.  Candida esophagitis -Continue IV fluconazole  per GI  AKI on CKD stage  IIIa resolved Creatinine 3.2 on admission, resolved to baseline 0.6 with fluids  Metastatic breast cancer Chronic pain syndrome - Hold home anastrozole  and Verzenio  until able to take p.o. - Continue fentanyl  and IV Valium  for pain and anxiety control  Acute on chronic anemia Microcytosis  Due to CKD and cancer. -F/u iron panel   Hypomagnesemia Hypokalemia Hypophosphatemia Hypocalcemia in the setting of hypoalbuminemia  -Supplement Calcium  via TPN  --F/u CMP in the AM       Subjective: Abd pain diffuse. Feeling better with PCA pump.      Physical Exam: BP 104/74 (BP Location: Left Arm)   Pulse 88   Temp 99.4 F (37.4 C) (Oral)   Resp 18   Ht 5' 11 (1.803 m)   Wt 69.7 kg   SpO2 99%   BMI 21.43 kg/m   Physical Exam  Constitutional: In no distress.  HEENT: NGT in place  Cardiovascular: Normal rate, regular rhythm. No lower extremity edema  Pulmonary: Non labored breathing on room air, no wheezing or rales.  Abdominal: Soft. Normal bowel sounds. Non distended and diffusely mildly tender to palapation.  Musculoskeletal: Normal range of motion.     Neurological: Alert and oriented to person, place, and time. Non focal  Skin: Skin is warm and dry.    Data Reviewed:  Basic metabolic panel shows hypocalcemia, even when corrected for low albumin   CBC shows anemia, nl WBC, microcytosis    Family Communication: Patient aware of plan, noted no questions, and verbalized understanding of the plan  Disposition: Status is: Inpatient 53 year old female with metastatic breast cancer and what appears to be new pancreatic cancer  Oncology, GI, and general surgery are consulted and have seen the patient  Unfortunately the mass is not amenable to biopsy and is causing obstruction  For now we will focus on relief of the obstruction of the bypass surgery on Monday or Tuesday.  If no biopsy is possible during that surgery, will defer to oncology what future imaging can  help delineate the nature of the pancreatic mass        Author: Alban Pepper, MD 01/01/2024 8:56 AM  For on call review [email or web address].

## 2024-01-01 NOTE — Plan of Care (Signed)
   Problem: Education: Goal: Knowledge of General Education information will improve Description: Including pain rating scale, medication(s)/side effects and non-pharmacologic comfort measures Outcome: Progressing   Problem: Activity: Goal: Risk for activity intolerance will decrease Outcome: Progressing   Problem: Nutrition: Goal: Adequate nutrition will be maintained Outcome: Progressing   Problem: Coping: Goal: Level of anxiety will decrease Outcome: Progressing   Problem: Elimination: Goal: Will not experience complications related to bowel motility Outcome: Progressing   Problem: Pain Managment: Goal: General experience of comfort will improve and/or be controlled Outcome: Progressing   Problem: Safety: Goal: Ability to remain free from injury will improve Outcome: Progressing

## 2024-01-01 NOTE — Progress Notes (Signed)
 PHARMACY - TOTAL PARENTERAL NUTRITION CONSULT NOTE   Indication: Pancreatic mass with duodenal stricture/obstruction  Patient Measurements: Height: 5' 11 (180.3 cm) Weight: 69.7 kg (153 lb 10.6 oz) IBW/kg (Calculated) : 70.8 TPN AdjBW (KG): 69.6 Body mass index is 21.43 kg/m. Usual Weight: 92.5 kg in April of this year  Assessment:  Pharmacy consulted to manage TPN for this 53 yo needing nutritional support while awaiting surgical intervention for pancreatic mass with duodenal stricture/obstruction in the setting of metastatic breast cancer resulting in poor oral intake, persistent nausea and vomiting,  and weight loss.  Glucose / Insulin :  no Hx DM. BG goal <180 - CBGs < 150, no insulin  / 24 hrs Electrolytes: CoCa low but improved to 8.2 , all other lytes WNL Renal: SCr and BUN WNL  Hepatic: 7/10 labs: alk phos elevated, AST/ALT elevated, Total Bilirubin WNL, Total Protein elevated, triglycerides elevated at 267 Intake / Output; MIVF:  NGT placed 7/11 MIVF: NS 30 ml/hr Output: Urine , Emesis/NG 1000 mL  LBM: 7/11 GI Imaging: 7/4 MR abdomen MRCP: pancreatic mass, vertebral metastases, pancolonic diverticulosis 7/10 CT: pancreatic head equivocal fullness, small amount free pelvic fluid, widespread osseous metastatic disease, small hiatal hernia, colonic diverticulosis GI Surgeries / Procedures:  7/8 EGD 7/7 EUS, upper GI tract  Central access: PICC placed 7/9 TPN start date: 7/9  Nutritional Goals: pending dietary assessment Goal TPN rate is 90 mL/hr (provides 104 g of protein and 2238 kcals per day)  RD Assessment: pending Estimated Needs Total Energy Estimated Needs: 2000-2300kcal/day Total Protein Estimated Needs: 100-115g/day Total Fluid Estimated Needs: 2.1-2.4L/day  Current Nutrition:  NPO  Plan: Continue TPN at goal rate of 90 mL/hr at 1800 Electrolytes in TPN:  Na 100 mEq/L - increased K 30 mEq/L - decreased Ca 8 mEq/L Mg 5 mEq/L - decreased Phos 20  mmol/L Cl:Ac 1:1 Add standard MVI and trace elements to TPN Thiamine  100 mg IV daily in TPN Continue very sensitive q6h SSI and adjust as needed  Stop NS infusion tonight @1800 , per MD Monitor TPN labs on Mon/Thurs Plan for bypass surgery this coming week, possible G tube   Thank you for allowing pharmacy to be a part of this patient's care.   Lacinda Moats, PharmD Clinical Pharmacist  7/13/20258:46 AM

## 2024-01-01 NOTE — Progress Notes (Signed)
 5 Days Post-Op   Subjective/Chief Complaint: No change   Objective: Vital signs in last 24 hours: Temp:  [98.2 F (36.8 C)-100.4 F (38 C)] 99.4 F (37.4 C) (07/13 0442) Pulse Rate:  [73-88] 88 (07/13 0442) Resp:  [16-20] 18 (07/13 0442) BP: (104-117)/(72-74) 104/74 (07/13 0442) SpO2:  [99 %-100 %] 99 % (07/13 0442) Weight:  [69.7 kg] 69.7 kg (07/13 0500) Last BM Date : 12/30/23  Intake/Output from previous day: 07/12 0701 - 07/13 0700 In: 3063.7 [I.V.:2913.7; IV Piggyback:150] Out: 1825 [Urine:800; Emesis/NG output:1025] Intake/Output this shift: No intake/output data recorded.  Ab soft nontender  Lab Results:  Recent Labs    12/31/23 0405 01/01/24 0312  WBC 4.7 5.2  HGB 8.3* 8.7*  HCT 26.8* 27.8*  PLT 182 194   BMET Recent Labs    12/31/23 0405 01/01/24 0312  NA 136 134*  K 3.8 4.5  CL 107 105  CO2 22 22  GLUCOSE 116* 133*  BUN 12 16  CREATININE 0.66 0.67  CALCIUM  6.8* 7.2*   PT/INR No results for input(s): LABPROT, INR in the last 72 hours. ABG No results for input(s): PHART, HCO3 in the last 72 hours.  Invalid input(s): PCO2, PO2  Studies/Results: DG Abd Portable 1V Result Date: 12/30/2023 CLINICAL DATA:  NG tube placement EXAM: PORTABLE ABDOMEN - 1 VIEW COMPARISON:  CT abdomen pelvis 12/29/2023 FINDINGS: Enteric tube tip and side-port in the stomach. Gaseous distention of loops of bowel in the left upper abdomen similar to prior CT. IMPRESSION: Enteric tube tip and side-port in the stomach. Electronically Signed   By: Norman Gatlin M.D.   On: 12/30/2023 17:38    Anti-infectives: Anti-infectives (From admission, onward)    Start     Dose/Rate Route Frequency Ordered Stop   12/22/23 2230  fluconazole  (DIFLUCAN ) IVPB 200 mg        200 mg 100 mL/hr over 60 Minutes Intravenous Every 24 hours 12/22/23 2132         Assessment/Plan:  Pancreatic mass with duodenal stricture/obstruction in the setting of metastatic breast  cancer -TPN -should remain on sips/chips and do not advance diet in order to avoid further vomiting -CA 19-9 is 465 -discussed case with [facility] surgeon.  Plan at this time is nutritional support this week/weekend with TPN and plan for bypass surgery next week, possible g-tube, etc.   -surgery early this week is plan     FEN - sips/chips, TPN VTE - ok for chemical prophylaxis from our standpoint,  HTN HLD protein calorie nutrition - place PICC, TNA.  Prealbumin 13 from 20 just 5 days ago asthma   I reviewed hospitalist notes, last 24 h vitals and pain scores, last 48 h intake and output, last 24 h labs and trends, and last 24 h imaging results.   Chelsea Hansen 01/01/2024

## 2024-01-01 NOTE — Progress Notes (Signed)
 0630 Patient Chelsea Hansen stated that the 50mcg of Fentanyl  relieves pain for 30 minutes and then her pain level increases back to 10/10.  Patient is requesting a reevaluation of her pain management.  Continue to monitor.

## 2024-01-02 ENCOUNTER — Other Ambulatory Visit: Payer: Self-pay

## 2024-01-02 ENCOUNTER — Encounter (HOSPITAL_COMMUNITY)
Admission: EM | Discharge status: Against Medical Advice | Payer: Self-pay | Source: Ambulatory Visit | Attending: Internal Medicine

## 2024-01-02 ENCOUNTER — Inpatient Hospital Stay (HOSPITAL_COMMUNITY): Payer: MEDICAID | Admitting: Anesthesiology

## 2024-01-02 ENCOUNTER — Telehealth: Payer: Self-pay

## 2024-01-02 ENCOUNTER — Encounter (HOSPITAL_COMMUNITY): Payer: Self-pay | Admitting: Family Medicine

## 2024-01-02 DIAGNOSIS — K315 Obstruction of duodenum: Secondary | ICD-10-CM

## 2024-01-02 DIAGNOSIS — E785 Hyperlipidemia, unspecified: Secondary | ICD-10-CM | POA: Diagnosis not present

## 2024-01-02 DIAGNOSIS — F418 Other specified anxiety disorders: Secondary | ICD-10-CM

## 2024-01-02 DIAGNOSIS — I10 Essential (primary) hypertension: Secondary | ICD-10-CM

## 2024-01-02 DIAGNOSIS — K869 Disease of pancreas, unspecified: Secondary | ICD-10-CM | POA: Diagnosis not present

## 2024-01-02 HISTORY — PX: LAPAROSCOPIC ROUX-EN-Y GASTRIC BYPASS WITH HIATAL HERNIA REPAIR: SHX6513

## 2024-01-02 LAB — GLUCOSE, CAPILLARY
Glucose-Capillary: 139 mg/dL — ABNORMAL HIGH (ref 70–99)
Glucose-Capillary: 151 mg/dL — ABNORMAL HIGH (ref 70–99)
Glucose-Capillary: 163 mg/dL — ABNORMAL HIGH (ref 70–99)

## 2024-01-02 LAB — COMPREHENSIVE METABOLIC PANEL WITH GFR
ALT: 83 U/L — ABNORMAL HIGH (ref 0–44)
AST: 31 U/L (ref 15–41)
Albumin: 2.8 g/dL — ABNORMAL LOW (ref 3.5–5.0)
Alkaline Phosphatase: 529 U/L — ABNORMAL HIGH (ref 38–126)
Anion gap: 9 (ref 5–15)
BUN: 16 mg/dL (ref 6–20)
CO2: 21 mmol/L — ABNORMAL LOW (ref 22–32)
Calcium: 7.9 mg/dL — ABNORMAL LOW (ref 8.9–10.3)
Chloride: 107 mmol/L (ref 98–111)
Creatinine, Ser: 0.75 mg/dL (ref 0.44–1.00)
GFR, Estimated: >60 mL/min (ref >60)
Glucose, Bld: 133 mg/dL — ABNORMAL HIGH (ref 70–99)
Potassium: 4.6 mmol/L (ref 3.5–5.1)
Sodium: 137 mmol/L (ref 135–145)
Total Bilirubin: 0.6 mg/dL (ref 0.0–1.2)
Total Protein: 6.1 g/dL — ABNORMAL LOW (ref 6.5–8.1)

## 2024-01-02 LAB — IRON AND TIBC
Iron: 84 ug/dL (ref 28–170)
Saturation Ratios: 44 % — ABNORMAL HIGH (ref 10.4–31.8)
TIBC: 190 ug/dL — ABNORMAL LOW (ref 250–450)
UIBC: 106 ug/dL

## 2024-01-02 LAB — MAGNESIUM: Magnesium: 1.9 mg/dL (ref 1.7–2.4)

## 2024-01-02 LAB — TYPE AND SCREEN
ABO/RH(D): B POS
Antibody Screen: NEGATIVE

## 2024-01-02 LAB — CBC
HCT: 28.1 % — ABNORMAL LOW (ref 36.0–46.0)
Hemoglobin: 8.8 g/dL — ABNORMAL LOW (ref 12.0–15.0)
MCH: 23.6 pg — ABNORMAL LOW (ref 26.0–34.0)
MCHC: 31.3 g/dL (ref 30.0–36.0)
MCV: 75.3 fL — ABNORMAL LOW (ref 80.0–100.0)
Platelets: 187 K/uL (ref 150–400)
RBC: 3.73 MIL/uL — ABNORMAL LOW (ref 3.87–5.11)
RDW: 15.9 % — ABNORMAL HIGH (ref 11.5–15.5)
WBC: 4.5 K/uL (ref 4.0–10.5)
nRBC: 0.4 % — ABNORMAL HIGH (ref 0.0–0.2)

## 2024-01-02 LAB — FERRITIN: Ferritin: 921 ng/mL — ABNORMAL HIGH (ref 11–307)

## 2024-01-02 LAB — RETIC PANEL
Immature Retic Fract: 18.8 % — ABNORMAL HIGH (ref 2.3–15.9)
RBC.: 3.42 MIL/uL — ABNORMAL LOW (ref 3.87–5.11)
Retic Count, Absolute: 34.2 K/uL (ref 19.0–186.0)
Retic Ct Pct: 1 % (ref 0.4–3.1)
Reticulocyte Hemoglobin: 21.1 pg — ABNORMAL LOW (ref >27.9)

## 2024-01-02 LAB — PHOSPHORUS: Phosphorus: 2.7 mg/dL (ref 2.5–4.6)

## 2024-01-02 LAB — TRIGLYCERIDES: Triglycerides: 261 mg/dL — ABNORMAL HIGH (ref <150)

## 2024-01-02 SURGERY — CREATION, GASTRIC BYPASS, LAPAROSCOPIC, USING ROUX-EN-Y GASTROENTEROSTOMY, WITH HIATAL HERNIA REPAIR
Anesthesia: General | Site: Abdomen

## 2024-01-02 MED ORDER — FENTANYL CITRATE (PF) 100 MCG/2ML IJ SOLN
INTRAMUSCULAR | Status: DC | PRN
  Administered 2024-01-02 (×4): 50 ug via INTRAVENOUS

## 2024-01-02 MED ORDER — ROCURONIUM BROMIDE 100 MG/10ML IV SOLN
INTRAVENOUS | Status: DC | PRN
  Administered 2024-01-02: 50 mg via INTRAVENOUS

## 2024-01-02 MED ORDER — ROCURONIUM BROMIDE 10 MG/ML (PF) SYRINGE
PREFILLED_SYRINGE | INTRAVENOUS | Status: AC
  Filled 2024-01-02: qty 10

## 2024-01-02 MED ORDER — ENOXAPARIN SODIUM 40 MG/0.4ML IJ SOSY
40.0000 mg | PREFILLED_SYRINGE | INTRAMUSCULAR | Status: DC
  Administered 2024-01-03 – 2024-01-04 (×2): 40 mg via SUBCUTANEOUS
  Filled 2024-01-02 (×2): qty 0.4

## 2024-01-02 MED ORDER — LACTATED RINGERS IV SOLN: INTRAVENOUS | Status: DC

## 2024-01-02 MED ORDER — OXYCODONE HCL 5 MG PO TABS: 5.0000 mg | ORAL_TABLET | Freq: Once | ORAL | Status: DC | PRN

## 2024-01-02 MED ORDER — CEFAZOLIN SODIUM-DEXTROSE 2-4 GM/100ML-% IV SOLN
INTRAVENOUS | Status: AC
  Filled 2024-01-02: qty 100

## 2024-01-02 MED ORDER — ACETAMINOPHEN 10 MG/ML IV SOLN
INTRAVENOUS | Status: DC | PRN
Start: 2024-01-02 — End: 2024-01-02
  Administered 2024-01-02: 1000 mg via INTRAVENOUS

## 2024-01-02 MED ORDER — ACETAMINOPHEN 10 MG/ML IV SOLN
INTRAVENOUS | Status: AC
  Filled 2024-01-02: qty 100

## 2024-01-02 MED ORDER — KETAMINE HCL 50 MG/5ML IJ SOSY
PREFILLED_SYRINGE | INTRAMUSCULAR | Status: AC
  Filled 2024-01-02: qty 5

## 2024-01-02 MED ORDER — PHENYLEPHRINE HCL-NACL 20-0.9 MG/250ML-% IV SOLN
INTRAVENOUS | Status: DC | PRN
  Administered 2024-01-02: 40 ug/min via INTRAVENOUS

## 2024-01-02 MED ORDER — DEXAMETHASONE SODIUM PHOSPHATE 10 MG/ML IJ SOLN
INTRAMUSCULAR | Status: AC
  Filled 2024-01-02: qty 1

## 2024-01-02 MED ORDER — LIDOCAINE HCL (CARDIAC) PF 100 MG/5ML IV SOSY
PREFILLED_SYRINGE | INTRAVENOUS | Status: DC | PRN
  Administered 2024-01-02: 60 mg via INTRAVENOUS

## 2024-01-02 MED ORDER — HYDROMORPHONE HCL 1 MG/ML IJ SOLN
0.2500 mg | INTRAMUSCULAR | Status: DC | PRN
  Administered 2024-01-02 (×4): 0.5 mg via INTRAVENOUS

## 2024-01-02 MED ORDER — BUPIVACAINE-EPINEPHRINE 0.25% -1:200000 IJ SOLN
INTRAMUSCULAR | Status: DC | PRN
  Administered 2024-01-02: 30 mL

## 2024-01-02 MED ORDER — OXYCODONE HCL 5 MG/5ML PO SOLN: 5.0000 mg | Freq: Once | ORAL | Status: DC | PRN

## 2024-01-02 MED ORDER — LIDOCAINE HCL (PF) 2 % IJ SOLN
INTRAMUSCULAR | Status: AC
  Filled 2024-01-02: qty 5

## 2024-01-02 MED ORDER — METHOCARBAMOL 1000 MG/10ML IJ SOLN
500.0000 mg | Freq: Three times a day (TID) | INTRAMUSCULAR | Status: DC
  Administered 2024-01-02 – 2024-01-17 (×41): 500 mg via INTRAVENOUS
  Filled 2024-01-02 (×42): qty 10

## 2024-01-02 MED ORDER — LACTATED RINGERS IR SOLN
Status: DC | PRN
  Administered 2024-01-02: 1000 mL

## 2024-01-02 MED ORDER — ACETAMINOPHEN 10 MG/ML IV SOLN
1000.0000 mg | Freq: Four times a day (QID) | INTRAVENOUS | Status: AC
  Administered 2024-01-02 – 2024-01-03 (×4): 1000 mg via INTRAVENOUS
  Filled 2024-01-02 (×4): qty 100

## 2024-01-02 MED ORDER — ALBUMIN HUMAN 5 % IV SOLN
INTRAVENOUS | Status: AC
  Filled 2024-01-02: qty 250

## 2024-01-02 MED ORDER — PROPOFOL 10 MG/ML IV BOLUS
INTRAVENOUS | Status: AC
  Filled 2024-01-02: qty 20

## 2024-01-02 MED ORDER — SUGAMMADEX SODIUM 200 MG/2ML IV SOLN
INTRAVENOUS | Status: DC | PRN
  Administered 2024-01-02: 200 mg via INTRAVENOUS

## 2024-01-02 MED ORDER — ONDANSETRON HCL 4 MG/2ML IJ SOLN
INTRAMUSCULAR | Status: DC | PRN
Start: 2024-01-02 — End: 2024-01-02
  Administered 2024-01-02: 4 mg via INTRAVENOUS

## 2024-01-02 MED ORDER — DEXMEDETOMIDINE HCL IN NACL 200 MCG/50ML IV SOLN
INTRAVENOUS | Status: DC | PRN
Start: 2024-01-02 — End: 2024-01-02
  Administered 2024-01-02: 8 ug via INTRAVENOUS

## 2024-01-02 MED ORDER — EPHEDRINE SULFATE (PRESSORS) 50 MG/ML IJ SOLN
INTRAMUSCULAR | Status: DC | PRN
  Administered 2024-01-02: 5 mg via INTRAVENOUS

## 2024-01-02 MED ORDER — PHENYLEPHRINE HCL-NACL 20-0.9 MG/250ML-% IV SOLN
INTRAVENOUS | Status: AC
  Filled 2024-01-02: qty 250

## 2024-01-02 MED ORDER — PHENYLEPHRINE 80 MCG/ML (10ML) SYRINGE FOR IV PUSH (FOR BLOOD PRESSURE SUPPORT)
PREFILLED_SYRINGE | INTRAVENOUS | Status: AC
  Filled 2024-01-02: qty 10

## 2024-01-02 MED ORDER — ALBUMIN HUMAN 5 % IV SOLN: INTRAVENOUS | Status: DC | PRN

## 2024-01-02 MED ORDER — 0.9 % SODIUM CHLORIDE (POUR BTL) OPTIME
TOPICAL | Status: DC | PRN
  Administered 2024-01-02: 1000 mL

## 2024-01-02 MED ORDER — ONDANSETRON HCL 4 MG/2ML IJ SOLN
INTRAMUSCULAR | Status: AC
  Filled 2024-01-02: qty 2

## 2024-01-02 MED ORDER — TRAVASOL 10 % IV SOLN
INTRAVENOUS | Status: AC
  Filled 2024-01-02: qty 1036.8

## 2024-01-02 MED ORDER — KETAMINE HCL 10 MG/ML IJ SOLN
INTRAMUSCULAR | Status: DC | PRN
  Administered 2024-01-02: 20 mg via INTRAVENOUS

## 2024-01-02 MED ORDER — DEXAMETHASONE SODIUM PHOSPHATE 10 MG/ML IJ SOLN
INTRAMUSCULAR | Status: DC | PRN
  Administered 2024-01-02: 5 mg via INTRAVENOUS

## 2024-01-02 MED ORDER — LACTATED RINGERS IV SOLN: INTRAVENOUS | Status: DC | PRN

## 2024-01-02 MED ORDER — CEFAZOLIN SODIUM-DEXTROSE 2-3 GM-%(50ML) IV SOLR
INTRAVENOUS | Status: DC | PRN
Start: 2024-01-02 — End: 2024-01-02
  Administered 2024-01-02: 2 g via INTRAVENOUS

## 2024-01-02 MED ORDER — SCOPOLAMINE 1 MG/3DAYS TD PT72
1.0000 | MEDICATED_PATCH | TRANSDERMAL | Status: DC
  Administered 2024-01-02: 1.5 mg via TRANSDERMAL
  Filled 2024-01-02: qty 1

## 2024-01-02 MED ORDER — HYDROMORPHONE HCL 1 MG/ML IJ SOLN
INTRAMUSCULAR | Status: AC
  Filled 2024-01-02: qty 2

## 2024-01-02 MED ORDER — PROPOFOL 10 MG/ML IV BOLUS
INTRAVENOUS | Status: DC | PRN
  Administered 2024-01-02: 200 mg via INTRAVENOUS
  Administered 2024-01-02: 20 mg via INTRAVENOUS

## 2024-01-02 MED ORDER — EPHEDRINE 5 MG/ML INJ
INTRAVENOUS | Status: AC
  Filled 2024-01-02: qty 5

## 2024-01-02 MED ORDER — ONDANSETRON HCL 4 MG/2ML IJ SOLN
4.0000 mg | Freq: Once | INTRAMUSCULAR | Status: AC | PRN
  Administered 2024-01-02: 4 mg via INTRAVENOUS

## 2024-01-02 MED ORDER — MIDAZOLAM HCL 5 MG/5ML IJ SOLN
INTRAMUSCULAR | Status: DC | PRN
  Administered 2024-01-02 (×2): 1 mg via INTRAVENOUS

## 2024-01-02 MED ORDER — CHLORHEXIDINE GLUCONATE 0.12 % MT SOLN
15.0000 mL | Freq: Once | OROMUCOSAL | Status: AC
  Administered 2024-01-02: 15 mL via OROMUCOSAL

## 2024-01-02 MED ORDER — PROCHLORPERAZINE EDISYLATE 10 MG/2ML IJ SOLN
10.0000 mg | INTRAMUSCULAR | Status: AC | PRN
  Administered 2024-01-02 – 2024-01-07 (×4): 10 mg via INTRAVENOUS
  Filled 2024-01-02 (×4): qty 2

## 2024-01-02 MED ORDER — AMISULPRIDE (ANTIEMETIC) 5 MG/2ML IV SOLN
INTRAVENOUS | Status: AC
Start: 2024-01-02 — End: 2024-01-02
  Filled 2024-01-02: qty 4

## 2024-01-02 MED ORDER — SUCCINYLCHOLINE CHLORIDE 200 MG/10ML IV SOSY
PREFILLED_SYRINGE | INTRAVENOUS | Status: AC
  Filled 2024-01-02: qty 10

## 2024-01-02 MED ORDER — STERILE WATER FOR IRRIGATION IR SOLN
Status: DC | PRN
  Administered 2024-01-02: 1000 mL

## 2024-01-02 MED ORDER — PHENYLEPHRINE HCL (PRESSORS) 10 MG/ML IV SOLN
INTRAVENOUS | Status: DC | PRN
Start: 2024-01-02 — End: 2024-01-02
  Administered 2024-01-02: 80 ug via INTRAVENOUS
  Administered 2024-01-02 (×3): 160 ug via INTRAVENOUS

## 2024-01-02 MED ORDER — ACETAMINOPHEN 500 MG PO TABS: 1000.0000 mg | ORAL_TABLET | Freq: Once | ORAL | Status: DC

## 2024-01-02 MED ORDER — BUPIVACAINE-EPINEPHRINE (PF) 0.25% -1:200000 IJ SOLN
INTRAMUSCULAR | Status: AC
  Filled 2024-01-02: qty 30

## 2024-01-02 MED ORDER — MIDAZOLAM HCL 2 MG/2ML IJ SOLN
INTRAMUSCULAR | Status: AC
  Filled 2024-01-02: qty 2

## 2024-01-02 MED ORDER — AMISULPRIDE (ANTIEMETIC) 5 MG/2ML IV SOLN
10.0000 mg | Freq: Once | INTRAVENOUS | Status: AC | PRN
  Administered 2024-01-02: 10 mg via INTRAVENOUS

## 2024-01-02 MED ORDER — SUCCINYLCHOLINE CHLORIDE 200 MG/10ML IV SOSY
PREFILLED_SYRINGE | INTRAVENOUS | Status: DC | PRN
  Administered 2024-01-02: 100 mg via INTRAVENOUS

## 2024-01-02 MED ORDER — FENTANYL CITRATE (PF) 250 MCG/5ML IJ SOLN
INTRAMUSCULAR | Status: AC
  Filled 2024-01-02: qty 5

## 2024-01-02 SURGICAL SUPPLY — 67 items
APPLICATOR COTTON TIP 6 STRL (MISCELLANEOUS) IMPLANT
APPLICATOR VISTASEAL 35 (MISCELLANEOUS) ×4 IMPLANT
BAG COUNTER SPONGE SURGICOUNT (BAG) IMPLANT
BLADE SURG SZ11 CARB STEEL (BLADE) ×2 IMPLANT
CABLE HIGH FREQUENCY MONO STRZ (ELECTRODE) IMPLANT
CHLORAPREP W/TINT 26 (MISCELLANEOUS) ×4 IMPLANT
CLIP APPLIE ROT 13.4 12 LRG (CLIP) IMPLANT
CLIP SUT LAPRA TY ABSORB (SUTURE) ×4 IMPLANT
COVER SURGICAL LIGHT HANDLE (MISCELLANEOUS) ×2 IMPLANT
DEVICE SUT QUICK LOAD TK 5 (SUTURE) IMPLANT
DEVICE SUT TI-KNOT TK 5X26 (SUTURE) IMPLANT
DEVICE SUTURE ENDOST 10MM (ENDOMECHANICALS) ×2 IMPLANT
DRAIN PENROSE 0.25X18 (DRAIN) ×2 IMPLANT
DRSG TEGADERM 2-3/8X2-3/4 SM (GAUZE/BANDAGES/DRESSINGS) ×12 IMPLANT
ELECT REM PT RETURN 15FT ADLT (MISCELLANEOUS) ×2 IMPLANT
GAUZE 4X4 16PLY ~~LOC~~+RFID DBL (SPONGE) ×2 IMPLANT
GAUZE SPONGE 2X2 8PLY STRL LF (GAUZE/BANDAGES/DRESSINGS) ×2 IMPLANT
GAUZE SPONGE 4X4 12PLY STRL (GAUZE/BANDAGES/DRESSINGS) IMPLANT
GLOVE BIO SURGEON STRL SZ7.5 (GLOVE) ×2 IMPLANT
GLOVE INDICATOR 8.0 STRL GRN (GLOVE) ×2 IMPLANT
GOWN STRL REUS W/ TWL XL LVL3 (GOWN DISPOSABLE) ×2 IMPLANT
GRASPER SUT TROCAR 14GX15 (MISCELLANEOUS) IMPLANT
IRRIGATION SUCT STRKRFLW 2 WTP (MISCELLANEOUS) ×2 IMPLANT
KIT BASIN OR (CUSTOM PROCEDURE TRAY) ×2 IMPLANT
KIT GASTRIC LAVAGE 34FR ADT (SET/KITS/TRAYS/PACK) ×2 IMPLANT
KIT TURNOVER KIT A (KITS) ×2 IMPLANT
LIGASURE LAP MARYLAND 5MM 37CM (ELECTROSURGICAL) IMPLANT
MARKER SKIN DUAL TIP RULER LAB (MISCELLANEOUS) ×2 IMPLANT
MAT PREVALON FULL STRYKER (MISCELLANEOUS) ×2 IMPLANT
NDL SPNL 22GX3.5 QUINCKE BK (NEEDLE) ×2 IMPLANT
NEEDLE SPNL 22GX3.5 QUINCKE BK (NEEDLE) ×1 IMPLANT
PACK CARDIOVASCULAR III (CUSTOM PROCEDURE TRAY) ×2 IMPLANT
RELOAD STAPLE 60 2.6 WHT THN (STAPLE) ×4 IMPLANT
RELOAD STAPLE 60 3.6 BLU REG (STAPLE) ×4 IMPLANT
RELOAD STAPLE 60 3.8 GOLD REG (STAPLE) ×2 IMPLANT
RELOAD STAPLER BLUE 60MM (STAPLE) IMPLANT
RELOAD STAPLER GOLD 60MM (STAPLE) IMPLANT
RELOAD STAPLER WHITE 60MM (STAPLE) ×2 IMPLANT
RELOAD SUT SNGL STCH ABSRB 2-0 (ENDOMECHANICALS) ×10 IMPLANT
RELOAD SUT SNGL STCH BLK 2-0 (ENDOMECHANICALS) ×8 IMPLANT
SCISSORS LAP 5X45 EPIX DISP (ENDOMECHANICALS) ×2 IMPLANT
SET TUBE SMOKE EVAC HIGH FLOW (TUBING) ×2 IMPLANT
SHEARS HARMONIC 45 ACE (MISCELLANEOUS) ×2 IMPLANT
SLEEVE ADV FIXATION 12X100MM (TROCAR) ×4 IMPLANT
SLEEVE ADV FIXATION 5X100MM (TROCAR) IMPLANT
SLEEVE Z-THREAD 5X100MM (TROCAR) IMPLANT
SOLUTION ANTFG W/FOAM PAD STRL (MISCELLANEOUS) ×2 IMPLANT
STAPLER ECHELON BIOABSB 60 FLE (MISCELLANEOUS) IMPLANT
STAPLER ECHELON LONG 60 440 (INSTRUMENTS) ×2 IMPLANT
STAPLER HERNIA 12 8.5 360D (INSTRUMENTS) IMPLANT
STRIP CLOSURE SKIN 1/2X4 (GAUZE/BANDAGES/DRESSINGS) ×2 IMPLANT
SURGILUBE 2OZ TUBE FLIPTOP (MISCELLANEOUS) ×2 IMPLANT
SUT MNCRL AB 4-0 PS2 18 (SUTURE) ×2 IMPLANT
SUT SURGIDAC NAB ES-9 0 48 120 (SUTURE) IMPLANT
SUT VIC AB 2-0 SH 27X BRD (SUTURE) ×2 IMPLANT
SUT VICRYL 0 TIES 12 18 (SUTURE) IMPLANT
SUTURE RELOAD END STTCH 2 48X1 (ENDOMECHANICALS) IMPLANT
SUTURE RELOAD ENDO STITCH 2.0 (ENDOMECHANICALS) IMPLANT
SYR 20ML LL LF (SYRINGE) ×2 IMPLANT
TOWEL OR 17X26 10 PK STRL BLUE (TOWEL DISPOSABLE) ×2 IMPLANT
TRAY FOLEY MTR SLVR 16FR STAT (SET/KITS/TRAYS/PACK) IMPLANT
TRAY FOLEY W/BAG SLVR 14FR LF (SET/KITS/TRAYS/PACK) IMPLANT
TROCAR ADV FIXATION 12X100MM (TROCAR) ×2 IMPLANT
TROCAR ADV FIXATION 5X100MM (TROCAR) ×2 IMPLANT
TROCAR XCEL NON-BLD 5MMX100MML (ENDOMECHANICALS) ×2 IMPLANT
TROCAR Z-THREAD OPTICAL 5X100M (TROCAR) IMPLANT
TUBING CONNECTING 10 (TUBING) ×4 IMPLANT

## 2024-01-02 NOTE — Progress Notes (Signed)
 PHARMACY - TOTAL PARENTERAL NUTRITION CONSULT NOTE   Indication: Pancreatic mass with duodenal stricture/obstruction  Patient Measurements: Height: 5' 11 (180.3 cm) Weight: 70 kg (154 lb 5.2 oz) IBW/kg (Calculated) : 70.8 TPN AdjBW (KG): 69.6 Body mass index is 21.52 kg/m. Usual Weight: 92.5 kg in April of this year  Assessment:  Pharmacy consulted to manage TPN for this 53 yo needing nutritional support while awaiting surgical intervention for pancreatic mass with duodenal stricture/obstruction in the setting of metastatic breast cancer resulting in poor oral intake, persistent nausea and vomiting,  and weight loss.  Glucose / Insulin :  no Hx DM. BG goal <180 - CBGs < 180, no insulin  / 24 hrs Electrolytes: all lytes WNL Renal: SCr and BUN WNL  Hepatic: alk phos elevated and rising, AST WNL, ALT elevated but trending down, Total Bilirubin WNL, triglycerides elevated at 261 but stable since last checked Intake / Output; MIVF:  NGT placed 7/11 MIVF: none Output: Urine , Emesis/NG 700 mL  LBM: 7/11 GI Imaging: 7/4 MR abdomen MRCP: pancreatic mass, vertebral metastases, pancolonic diverticulosis 7/10 CT: pancreatic head equivocal fullness, small amount free pelvic fluid, widespread osseous metastatic disease, small hiatal hernia, colonic diverticulosis GI Surgeries / Procedures:  7/8 EGD 7/7 EUS, upper GI tract 7/14: bypass surgery, possible g-tube planned for this week per notes  Central access: PICC placed 7/9 TPN start date: 7/9  Nutritional Goals:  Goal TPN rate is 90 mL/hr (provides 104 g of protein and 2238 kcals per day)  RD Assessment: Estimated Needs Total Energy Estimated Needs: 2000-2300kcal/day Total Protein Estimated Needs: 100-115g/day Total Fluid Estimated Needs: 2.1-2.4L/day  Current Nutrition:  NPO  Plan: Continue TPN at goal rate of 90 mL/hr at 1800 Electrolytes in TPN:  Na 100 mEq/L K 30 mEq/L Ca 8 mEq/L Mg 5 mEq/L Phos 20 mmol/L Cl:Ac  1:1 Add standard MVI and trace elements to TPN Thiamine  100 mg IV daily in TPN Continue very sensitive q6h SSI and adjust as needed  Monitor TPN labs on Mon/Thurs Plan for bypass surgery this week, possible G tube   Thank you for allowing pharmacy to be a part of this patient's care.  Eva CHRISTELLA Allis, PharmD, BCPS Secure Chat if ?s 01/02/2024 9:35 AM

## 2024-01-02 NOTE — Anesthesia Postprocedure Evaluation (Signed)
 Anesthesia Post Note  Patient: Chelsea Hansen  Procedure(s) Performed: LAPAROSCOPIC CREATION GASTRJEJONOSTOMY (Abdomen)     Patient location during evaluation: PACU Anesthesia Type: General Level of consciousness: awake and alert, oriented and patient cooperative Pain management: pain level controlled Vital Signs Assessment: post-procedure vital signs reviewed and stable Respiratory status: spontaneous breathing, nonlabored ventilation and respiratory function stable Cardiovascular status: blood pressure returned to baseline and stable Postop Assessment: no apparent nausea or vomiting Anesthetic complications: no   No notable events documented.  Last Vitals:  Vitals:   01/02/24 1445 01/02/24 1500  BP: (!) 172/111 (!) 158/94  Pulse: (!) 118 92  Resp: (!) 22 (!) 22  Temp:    SpO2: 98% 98%    Last Pain:  Vitals:   01/02/24 1515  TempSrc:   PainSc: 8                  [name]

## 2024-01-02 NOTE — Plan of Care (Signed)
  Problem: Education: Goal: Knowledge of General Education information will improve Description: Including pain rating scale, medication(s)/side effects and non-pharmacologic comfort measures 01/02/2024 0517 by Waneta Marylynn SAUNDERS, RN Outcome: Progressing 01/02/2024 0400 by Waneta Marylynn SAUNDERS, RN Outcome: Progressing   Problem: Health Behavior/Discharge Planning: Goal: Ability to manage health-related needs will improve 01/02/2024 0517 by Waneta Marylynn SAUNDERS, RN Outcome: Progressing 01/02/2024 0400 by Waneta Marylynn SAUNDERS, RN Outcome: Progressing   Problem: Clinical Measurements: Goal: Ability to maintain clinical measurements within normal limits will improve 01/02/2024 0517 by Waneta Marylynn SAUNDERS, RN Outcome: Progressing 01/02/2024 0400 by Waneta Marylynn SAUNDERS, RN Outcome: Progressing Goal: Will remain free from infection 01/02/2024 0517 by Waneta Marylynn SAUNDERS, RN Outcome: Progressing 01/02/2024 0400 by Waneta Marylynn SAUNDERS, RN Outcome: Progressing Goal: Diagnostic test results will improve Outcome: Progressing

## 2024-01-02 NOTE — Progress Notes (Signed)
 Progress Note   Patient: Chelsea Hansen FMW:[id number] DOB: October 12, 1970 DOA: 12/22/2023     10 DOS: the patient was seen and examined on 01/02/2024 at 10:45AM      Brief hospital course: 53 y.o. 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  Imaging showed a duodenal obstruction and masslike area of the pancreas.  GI and General Surgery consulted.    EUS attempted on 7/7, but unsuccessful secondary to duodenal stenosis.    Repeat EGD/EUS on 7/9 again unsuccessful in biopsy.  Gen surg consulted and current plan is for NG tube, TPN and possible surgery next week       Assessment and Plan:  Pancreatic mass with duodenal stricture Small bowel obstruction in setting of pancreatic mass This is a new mass on current imaging in the pancreas.  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  Unfortunately, EUS is the only way to biopsy the pancreas head mass, and this was negative twice.  Patient underwent by pass surgery and no Lns were biopsied.   Patient's NG tube was dislodge.  - Control nausea  - TPN --Pain control with PCA pump    Hypertension Blood pressure controlled - Hold home propranolol   Moderate protein calorie malnutrition -Continue TPN  Chronic asthma -Hold home Singulair  until able to take p.o.  Candida esophagitis -Continue IV fluconazole  per GI  AKI on CKD stage IIIa resolved Creatinine 3.2 on admission, resolved to baseline 0.6 with fluids  Metastatic breast cancer Chronic pain syndrome - Hold home anastrozole  and Verzenio  until able to take p.o. - Continue fentanyl  and IV Valium  for pain and anxiety control  Acute on chronic anemia Microcytosis  Due to CKD and cancer. Iron/TIBC/Ferritin/ %Sat    Component Value Date/Time   IRON 84 01/02/2024 0304   TIBC 190 (L) 01/02/2024 0304    FERRITIN 921 (H) 01/02/2024 0304   IRONPCTSAT 44 (H) 01/02/2024 0304  NO iron deficiency    Hypomagnesemia Hypokalemia Hypophosphatemia Hypocalcemia in the setting of hypoalbuminemia  -Supplement Calcium  via TPN     Latest Ref Rng & Units 01/02/2024    3:04 AM 01/01/2024    3:12 AM 12/31/2023    4:05 AM  CMP  Glucose 70 - 99 mg/dL 866  866  883   BUN 6 - 20 mg/dL 16  16  12    Creatinine 0.44 - 1.00 mg/dL 9.24  9.32  9.33   Sodium 135 - 145 mmol/L 137  134  136   Potassium 3.5 - 5.1 mmol/L 4.6  4.5  3.8   Chloride 98 - 111 mmol/L 107  105  107   CO2 22 - 32 mmol/L 21  22  22    Calcium  8.9 - 10.3 mg/dL 7.9  7.2  6.8   Total Protein 6.5 - 8.1 g/dL 6.1     Total Bilirubin 0.0 - 1.2 mg/dL 0.6     Alkaline Phos 38 - 126 U/L 529     AST 15 - 41 U/L 31     ALT 0 - 44 U/L 83      Calcium  improved.       Subjective: Drowsy after surgery. Sons and daughter in law at bedside with multiple questions.       Physical Exam: BP 122/72 (BP Location: Left Arm)   Pulse 65   Temp (!) 96.4 F (35.8 C)  Resp 14   Ht 5' 11 (1.803 m)   Wt 70 kg   SpO2 100%   BMI 21.52 kg/m   Physical Exam  Constitutional: In no distress.  Cardiovascular: Normal rate, regular rhythm. No lower extremity edema  Pulmonary: Non labored breathing on room air, no wheezing or rales.   Abdominal: Soft.  Non distended, mildly TTP diffusely. Laparoscopic incision sites w/ no drainage, no surrounding erythema Musculoskeletal: Normal range of motion.     Neurological: Alert and oriented to person, place, and time. Non focal  Skin: Skin is warm and dry.     Data Reviewed:    Latest Ref Rng & Units 01/02/2024    3:04 AM 01/01/2024    3:12 AM 12/31/2023    4:05 AM  CBC  WBC 4.0 - 10.5 K/uL 4.5  5.2  4.7   Hemoglobin 12.0 - 15.0 g/dL 8.8  8.7  8.3   Hematocrit 36.0 - 46.0 % 28.1  27.8  26.8   Platelets 150 - 400 K/uL 187  194  182    Hgb stable.   Nl creatinine. Calcium  improving.    Family  Communication: Patient aware of plan, noted no questions, and verbalized understanding of the plan. Sons and daughter in law at bedside.     Disposition: Status is: Inpatient 53 year old female with metastatic breast cancer and what appears to be new pancreatic cancer  Oncology, GI, and general surgery are consulted and have seen the patient  Remains on TPN, advance diet per surgery will defer to oncology what future imaging can help delineate the nature of the pancreatic mass        Author: Alban Pepper, MD 01/02/2024 7:16 PM  For on call review [email or web address].

## 2024-01-02 NOTE — Anesthesia Preprocedure Evaluation (Addendum)
 Anesthesia Evaluation  Patient identified by MRN, date of birth, ID band Patient awake    Reviewed: Allergy & Precautions, NPO status , Patient's Chart, lab work & pertinent test results, reviewed documented beta blocker date and time   Airway Mallampati: III  TM Distance: >3 FB Neck ROM: Full    Dental  (+) Poor Dentition, Missing, Dental Advisory Given, Chipped,    Pulmonary asthma    Pulmonary exam normal breath sounds clear to auscultation       Cardiovascular hypertension, Pt. on medications and Pt. on home beta blockers Normal cardiovascular exam+ dysrhythmias Supra Ventricular Tachycardia  Rhythm:Regular Rate:Normal     Neuro/Psych  Headaches PSYCHIATRIC DISORDERS Anxiety Depression       GI/Hepatic Neg liver ROS,GERD  ,,Duodenal stricture, new pancreatic adenoca in the setting of metastatic breast ca   Endo/Other  negative endocrine ROS    Renal/GU negative Renal ROS  negative genitourinary   Musculoskeletal negative musculoskeletal ROS (+)    Abdominal   Peds  Hematology  (+) Blood dyscrasia, anemia Hb 8.8, plt 187   Anesthesia Other Findings   Reproductive/Obstetrics negative OB ROS                              Anesthesia Physical Anesthesia Plan  ASA: 3  Anesthesia Plan: General   Post-op Pain Management: Ofirmev  IV (intra-op)*, Ketamine  IV* and Dilaudid  IV   Induction: Intravenous, Rapid sequence and Cricoid pressure planned  PONV Risk Score and Plan: 4 or greater and Ondansetron , Dexamethasone , Midazolam , Treatment may vary due to age or medical condition and Scopolamine  patch - Pre-op  Airway Management Planned: Oral ETT  Additional Equipment: None  Intra-op Plan:   Post-operative Plan: Extubation in OR  Informed Consent: I have reviewed the patients History and Physical, chart, labs and discussed the procedure including the risks, benefits and alternatives for  the proposed anesthesia with the patient or authorized representative who has indicated his/her understanding and acceptance.     Dental advisory given  Plan Discussed with: CRNA  Anesthesia Plan Comments:          Anesthesia Quick Evaluation

## 2024-01-02 NOTE — Op Note (Signed)
 Patient: Chelsea Hansen (29-Jun-1970, [id number])  Date of Surgery: 01/02/2024  Preoperative Diagnosis: DUODENAL STRICUTRE   Postoperative Diagnosis: DUODENAL STRICUTRE   Surgical Procedure: LAPAROSCOPIC CREATION GASTRJEJONOSTOMY:     Operative Team Members:  Surgeons and Role:    * Marticia Reifschneider, Deward PARAS, MD - Primary   Anesthesiologist: Merla Almarie CHRISTELLA, DO CRNA: Judythe Tanda Aran, CRNA; Flynn-Cook, Rollene LABOR, CRNA   Anesthesia: General   Fluids:  Total I/O In: 2050 [I.V.:1400; IV Piggyback:650] Out: 120 [Urine:100; Blood:20]  Complications: None  Drains:  none   Specimen: * No specimens in log *   Disposition:  PACU - hemodynamically stable.  Plan of Care: Continue inpatient care    Indications for Procedure: Chelsea Hansen is a 53 y.o. female who presented with a duodenal obstruction from a stricture thought to be malignant.  I recommended laparoscopic creation of gastrojejunostomy to bypass the stricture.  Tissue was unable to to be obtained during the workup and I explained that I would attempt to biopsy any concerning nodules, but biopsy of the pancreas itself was not recommended.  The procedure itself as well as its risks, benefits and alternatives were discussed.  The risks discussed included but were not limited to the risk of infection, bleeding, damage to nearby structures, and possible need for gastrostomy tube.  After a full discussion and all questions answered the patient granted consent to proceed.  Findings: No concerning nodules or evidence of metastatic disease on diagnostic laparoscopy   Description of Procedure:   On the date stated above the patient was taken to the operating room suite and placed in supine position.  General endotracheal anesthesia was induced.  A timeout was completed verifying the correct patient, procedure, position, and equipment needed for the case.  The patient's abdomen was prepped and draped in usual sterile  fashion.  I made a  5 mm incision in the right subcostal region and inserted a 5 mm trocar using optical technique.  The abdomen is inflated to 15 mmHg.  There is no trauma to the underlying viscera with initial trocar placement.  1 additional 5 mm trocar was placed in the left mid abdomen and 112 mm trocar in the right mid abdomen.  The abdomen was inspected.  I saw no evidence of gross metastatic disease or nodules for tissue biopsy.  I proceeded with the gastrojejunal bypass.  The transverse colon was elevated and identified the ligament of Treitz.  I counted approximately 25 cm distal to this and brought the jejunum up over the transverse colon to create the anastomosis.  The omentum was divided using bipolar energy.  The enterotomies were created in the antimesenteric border of the jejunum and the distal stomach near the greater curve on the anterior wall of the stomach.  These were created using electrocautery, then dilated using a [place] .  I then inserted the cartridge of the 60 mm white load endoscopic linear stapler into the jejunum and the anvil into the stomach and fired a 6 cm staple line to create the anastomosis.  The nasogastric tube was retracted prior to firing the stapler.  A second white load of the 60 mm endoscopic linear stapler was then again inserted into the jejunum and stomach to create an even larger anastomosis.  The common enterotomy was closed in 2 layers using running 2-0 Vicryl suture.  The anastomosis was well-formed at the conclusion of the case.  The nasogastric tube was advanced.  The 12 mm trocar was closed using 0  Vicryl at the fascial level.  The abdomen was deflated.  The skin was closed using 4-0 Monocryl and Dermabond.  All sponge needle counts were correct at the end of this case.    At the end of the case we reviewed the infection status of the case. Patient: Chelsea Hansen [facility] General [facility] Service Patient Case: Urgent Infection Present At Time Of Surgery  (PATOS): Some contamination related to creating a gastrojejunostomy  Deward Foy, MD General, Bariatric, & Minimally Invasive Surgery [facility], [place]

## 2024-01-02 NOTE — Telephone Encounter (Signed)
 Appt has been cancelled as planned

## 2024-01-02 NOTE — Plan of Care (Signed)

## 2024-01-02 NOTE — Telephone Encounter (Signed)
-----   Message from [facility] sent at 01/01/2024  6:22 AM EDT ----- Regarding: Upcoming appointment [name], Please cancel this upcoming appointment for [facility]. Patient will be in the hospital. GM

## 2024-01-02 NOTE — Anesthesia Procedure Notes (Signed)
 Procedure Name: Intubation Date/Time: 01/02/2024 12:58 PM  Performed by: Kathern Rollene LABOR, CRNAPre-anesthesia Checklist: Patient identified, Emergency Drugs available, Suction available and Patient being monitored Patient Re-evaluated:Patient Re-evaluated prior to induction Oxygen Delivery Method: Circle system utilized Preoxygenation: Pre-oxygenation with 100% oxygen Induction Type: IV induction, Cricoid Pressure applied and Rapid sequence Ventilation: Mask ventilation without difficulty Laryngoscope Size: Mac and 3 Grade View: Grade I Tube type: Oral Tube size: 7.0 mm Number of attempts: 1 Airway Equipment and Method: Stylet Placement Confirmation: ETT inserted through vocal cords under direct vision, positive ETCO2 and breath sounds checked- equal and bilateral Secured at: 22 cm Tube secured with: Tape Dental Injury: Teeth and Oropharynx as per pre-operative assessment

## 2024-01-02 NOTE — Progress Notes (Signed)
   Subjective/Chief Complaint: No major changes, tired of being in the hospital.  NG is miserable   Objective: Vital signs in last 24 hours: Temp:  [98 F (36.7 C)-99.1 F (37.3 C)] 98.6 F (37 C) (07/14 0436) Pulse Rate:  [76-83] 79 (07/14 0436) Resp:  [15-20] 15 (07/14 0726) BP: (98-117)/(64-77) 102/64 (07/14 0436) SpO2:  [97 %-100 %] 99 % (07/14 0436) Weight:  [70 kg] 70 kg (07/14 0500) Last BM Date : 12/30/23  Intake/Output from previous day: 07/13 0701 - 07/14 0700 In: 2222.8 [I.V.:2109.9; IV Piggyback:112.9] Out: 1900 [Urine:1200; Emesis/NG output:700] Intake/Output this shift: No intake/output data recorded.  Ab soft nontender  Lab Results:  Recent Labs    01/01/24 0312 01/02/24 0304  WBC 5.2 4.5  HGB 8.7* 8.8*  HCT 27.8* 28.1*  PLT 194 187   BMET Recent Labs    01/01/24 0312 01/02/24 0304  NA 134* 137  K 4.5 4.6  CL 105 107  CO2 22 21*  GLUCOSE 133* 133*  BUN 16 16  CREATININE 0.67 0.75  CALCIUM  7.2* 7.9*   PT/INR No results for input(s): LABPROT, INR in the last 72 hours. ABG No results for input(s): PHART, HCO3 in the last 72 hours.  Invalid input(s): PCO2, PO2  Studies/Results: No results found.   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  Albumin  was normal on admission to the hospital.  Was taking some protein bars prior to admission but quite a bit of vomiting, unsure how much absorption.  Now has been on TPN for 4 days.  I feel from a nutritional standpoint, it is okay to proceed to surgery  at this point, will continue TPN until tolerating PO diet.  I recommend laparoscopic gastrojejunostomy.  I will explore the abdomen laparoscopically and obtain biopsies of any concerning lesions if possible.  I do not plan on biopsying the pancreas, as this could lead to bigger issues.  We discussed the surgery, its risks, benefits and alternatives.  After a full discussion and all questions answered, the patient granted consent to proceed.   FEN - NPO, TPN VTE - ok for chemical prophylaxis  HTN HLD protein calorie nutrition - PICC, TPN   I reviewed hospitalist notes, last 24 h vitals and pain scores, last 48 h intake and output, last 24 h labs and trends, and last 24 h imaging results.   Deward PARAS Roseline Ebarb 01/02/2024

## 2024-01-02 NOTE — Transfer of Care (Signed)
 Immediate Anesthesia Transfer of Care Note  Patient: Chelsea Hansen  Procedure(s) Performed: LAPAROSCOPIC CREATION GASTRJEJONOSTOMY (Abdomen)  Patient Location: PACU  Anesthesia Type:General  Level of Consciousness: awake, alert , oriented, and patient cooperative  Airway & Oxygen Therapy: Patient Spontanous Breathing and Patient connected to face mask oxygen  Post-op Assessment: Report given to RN and Post -op Vital signs reviewed and stable  Post vital signs: Reviewed and stable  Last Vitals:  Vitals Value Taken Time  BP    Temp    Pulse    Resp    SpO2      Last Pain:  Vitals:   01/02/24 1058  TempSrc: Oral  PainSc: 0-No pain      Patients Stated Pain Goal: 3 (01/01/24 1116)  Complications: No notable events documented.

## 2024-01-03 ENCOUNTER — Encounter (HOSPITAL_COMMUNITY): Payer: Self-pay | Admitting: Surgery

## 2024-01-03 ENCOUNTER — Encounter: Payer: Self-pay | Admitting: General Practice

## 2024-01-03 ENCOUNTER — Inpatient Hospital Stay (HOSPITAL_COMMUNITY): Payer: MEDICAID

## 2024-01-03 DIAGNOSIS — K869 Disease of pancreas, unspecified: Secondary | ICD-10-CM | POA: Diagnosis not present

## 2024-01-03 LAB — CBC WITH DIFFERENTIAL/PLATELET
Abs Immature Granulocytes: 0.59 K/uL — ABNORMAL HIGH (ref 0.00–0.07)
Basophils Absolute: 0 K/uL (ref 0.0–0.1)
Basophils Relative: 1 %
Eosinophils Absolute: 0 K/uL (ref 0.0–0.5)
Eosinophils Relative: 1 %
HCT: 26.1 % — ABNORMAL LOW (ref 36.0–46.0)
Hemoglobin: 7.9 g/dL — ABNORMAL LOW (ref 12.0–15.0)
Immature Granulocytes: 9 %
Lymphocytes Relative: 15 %
Lymphs Abs: 1 K/uL (ref 0.7–4.0)
MCH: 22.8 pg — ABNORMAL LOW (ref 26.0–34.0)
MCHC: 30.3 g/dL (ref 30.0–36.0)
MCV: 75.2 fL — ABNORMAL LOW (ref 80.0–100.0)
Monocytes Absolute: 0.7 K/uL (ref 0.1–1.0)
Monocytes Relative: 11 %
Neutro Abs: 4.1 K/uL (ref 1.7–7.7)
Neutrophils Relative %: 63 %
Platelets: 195 K/uL (ref 150–400)
RBC: 3.47 MIL/uL — ABNORMAL LOW (ref 3.87–5.11)
RDW: 16.1 % — ABNORMAL HIGH (ref 11.5–15.5)
WBC Morphology: INCREASED
WBC: 6.4 K/uL (ref 4.0–10.5)
nRBC: 0 % (ref 0.0–0.2)

## 2024-01-03 LAB — COMPREHENSIVE METABOLIC PANEL WITH GFR
ALT: 57 U/L — ABNORMAL HIGH (ref 0–44)
AST: 26 U/L (ref 15–41)
Albumin: 3.1 g/dL — ABNORMAL LOW (ref 3.5–5.0)
Alkaline Phosphatase: 556 U/L — ABNORMAL HIGH (ref 38–126)
Anion gap: 6 (ref 5–15)
BUN: 13 mg/dL (ref 6–20)
CO2: 22 mmol/L (ref 22–32)
Calcium: 8.3 mg/dL — ABNORMAL LOW (ref 8.9–10.3)
Chloride: 105 mmol/L (ref 98–111)
Creatinine, Ser: 0.62 mg/dL (ref 0.44–1.00)
GFR, Estimated: >60 mL/min (ref >60)
Glucose, Bld: 267 mg/dL — ABNORMAL HIGH (ref 70–99)
Potassium: 5 mmol/L (ref 3.5–5.1)
Sodium: 133 mmol/L — ABNORMAL LOW (ref 135–145)
Total Bilirubin: 0.6 mg/dL (ref 0.0–1.2)
Total Protein: 6.7 g/dL (ref 6.5–8.1)

## 2024-01-03 LAB — GLUCOSE, CAPILLARY
Glucose-Capillary: 212 mg/dL — ABNORMAL HIGH (ref 70–99)
Glucose-Capillary: 234 mg/dL — ABNORMAL HIGH (ref 70–99)
Glucose-Capillary: 239 mg/dL — ABNORMAL HIGH (ref 70–99)
Glucose-Capillary: 242 mg/dL — ABNORMAL HIGH (ref 70–99)
Glucose-Capillary: 262 mg/dL — ABNORMAL HIGH (ref 70–99)
Glucose-Capillary: 264 mg/dL — ABNORMAL HIGH (ref 70–99)
Glucose-Capillary: 91 mg/dL (ref 70–99)

## 2024-01-03 LAB — PHOSPHORUS: Phosphorus: 1.5 mg/dL — ABNORMAL LOW (ref 2.5–4.6)

## 2024-01-03 LAB — MAGNESIUM: Magnesium: 1.7 mg/dL (ref 1.7–2.4)

## 2024-01-03 MED ORDER — INSULIN ASPART 100 UNIT/ML IJ SOLN
0.0000 [IU] | INTRAMUSCULAR | Status: DC
  Administered 2024-01-03 (×4): 3 [IU] via SUBCUTANEOUS
  Administered 2024-01-04 – 2024-01-05 (×2): 1 [IU] via SUBCUTANEOUS
  Administered 2024-01-05: 2 [IU] via SUBCUTANEOUS
  Administered 2024-01-05 – 2024-01-07 (×11): 1 [IU] via SUBCUTANEOUS

## 2024-01-03 MED ORDER — ENSURE MAX PROTEIN PO LIQD
11.0000 [oz_av] | Freq: Two times a day (BID) | ORAL | Status: DC
  Administered 2024-01-03 (×2): 11 [oz_av] via ORAL
  Filled 2024-01-03 (×7): qty 330

## 2024-01-03 MED ORDER — TRAVASOL 10 % IV SOLN
INTRAVENOUS | Status: AC
  Filled 2024-01-03: qty 1036.8

## 2024-01-03 MED ORDER — METOCLOPRAMIDE HCL 5 MG/ML IJ SOLN
10.0000 mg | Freq: Four times a day (QID) | INTRAMUSCULAR | Status: AC
  Administered 2024-01-03 – 2024-01-07 (×17): 10 mg via INTRAVENOUS
  Filled 2024-01-03 (×17): qty 2

## 2024-01-03 MED ORDER — SODIUM PHOSPHATES 45 MMOLE/15ML IV SOLN
15.0000 mmol | Freq: Once | INTRAVENOUS | Status: AC
  Administered 2024-01-03: 15 mmol via INTRAVENOUS
  Filled 2024-01-03: qty 5

## 2024-01-03 NOTE — Progress Notes (Signed)
 [facility] Spiritual Care Note  Referred by Morna Causey/NP for inpatient visit due to complexity of metastatic breast cancer plus pancreatic assessment. Visited with Ms Siracusa in 1438 to establish rapport and make her aware of ongoing Spiritual Care availability from the outpatient Cancer Center side. Brought [facility] Spiritual Care brochure and a blanket as a gesture of comfort and encouragement.  Ms Eckford reports struggling with a loss of independence as her physical needs have increased recently. She was very appreciative of connection and looks forward to outpatient follow-up. We plan to speak by phone in more detail next week.  01/03/24 1300  Spiritual Encounters  Type of Visit Initial  Care provided to: Patient  Referral source APP  Reason for visit Surgical  Spiritual Framework  Presenting Themes Significant life change;Courage hope and growth  Strengths openness to support and sharing  Needs/Challenges/Barriers health changes, loss of treasured independence  Patient Stress Factors Health changes;Major life changes;Loss of control   Chaplain [name], [place], [facility] Pager [phone] Voicemail [phone]

## 2024-01-03 NOTE — Plan of Care (Signed)
  Problem: Education: Goal: Knowledge of General Education information will improve Description: Including pain rating scale, medication(s)/side effects and non-pharmacologic comfort measures Outcome: Progressing   Problem: Health Behavior/Discharge Planning: Goal: Ability to manage health-related needs will improve 01/03/2024 0123 by Waneta Marylynn SAUNDERS, RN Outcome: Progressing 01/03/2024 0046 by Waneta Marylynn SAUNDERS, RN Outcome: Progressing   Problem: Clinical Measurements: Goal: Ability to maintain clinical measurements within normal limits will improve 01/03/2024 0123 by Waneta Marylynn SAUNDERS, RN Outcome: Progressing 01/03/2024 0046 by Waneta Marylynn SAUNDERS, RN Outcome: Progressing Goal: Will remain free from infection 01/03/2024 0123 by Waneta Marylynn SAUNDERS, RN Outcome: Progressing 01/03/2024 0046 by Waneta Marylynn SAUNDERS, RN Outcome: Progressing

## 2024-01-03 NOTE — Progress Notes (Signed)
 Responded to consult to assess PICC due to pt reports possible leaking. Noted TPN leaking from between cap and PICC lumen 2 connection. No visible signs of cracks/holes/damage to PICC line. Attempt to tighten cap did not resolve leak. TPN had to be disconnected to further assess PICC line. Cap changed. No visible damage noted to lumen threads. Lumen is positional and difficult to flush with arm in a neutral position. Flushing becomes easier with brisk blood return when arm is raised. Leaking resolves when arm is raised. These findings indicate that the pressure in the lumen is potentially causing the leak at the cap connection. Recommend considering a portable chest xray to confirm tip placement.

## 2024-01-03 NOTE — Progress Notes (Addendum)
 Progress Note   Patient: Chelsea Hansen FMW:[id number] DOB: October 15, 1970 DOA: 12/22/2023     11 DOS: the patient was seen and examined on 01/03/2024 at 10:45AM      Brief hospital course: 53 y.o. 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  Imaging showed a duodenal obstruction and masslike area of the pancreas.  GI and General Surgery consulted.    EUS attempted on 7/7, but unsuccessful secondary to duodenal stenosis.    Repeat EGD/EUS on 7/9 again unsuccessful in biopsy.  Gen surg consulted and current plan is for NG tube, TPN and possible surgery next week       Assessment and Plan:  Pancreatic mass with duodenal stricture Small bowel obstruction in setting of pancreatic mass This is a new mass on current imaging in the pancreas.  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  Unfortunately, EUS is the only way to biopsy the pancreas head mass, and this has been negative twice.  Patient underwent Gastrojejunal by pass surgery7/14  and no Lns were biopsied.   - TPN, advance diet per surgery  --Pain control with PCA pump, will start weaning tomorrow, pain well controlled today  --Mobilize    Hypertension Blood pressure controlled - Hold home propranolol   Moderate protein calorie malnutrition -Continue TPN --Advance diet per general surgery   Chronic asthma -Resume on discharge Singulair    Candida esophagitis -s/p fluconazole  course   AKI on CKD stage IIIa resolved Creatinine 3.2 on admission, resolved to baseline 0.6 with fluids     Latest Ref Rng & Units 01/03/2024    4:43 AM 01/02/2024    3:04 AM 01/01/2024    3:12 AM  BMP  Glucose 70 - 99 mg/dL 732  866  866   BUN 6 - 20 mg/dL 13  16  16    Creatinine 0.44 - 1.00 mg/dL 9.37  9.24  9.32   Sodium 135 - 145 mmol/L 133  137  134   Potassium 3.5 - 5.1  mmol/L 5.0  4.6  4.5   Chloride 98 - 111 mmol/L 105  107  105   CO2 22 - 32 mmol/L 22  21  22    Calcium  8.9 - 10.3 mg/dL 8.3  7.9  7.2      Metastatic breast cancer Chronic pain syndrome - Hold home anastrozole  and Verzenio  until able to take p.o. reliably  - Continue fentanyl  and IV Valium  for pain and anxiety control  Acute on chronic anemia Microcytosis  Due to CKD and cancer. Stable.  Iron/TIBC/Ferritin/ %Sat    Component Value Date/Time   IRON 84 01/02/2024 0304   TIBC 190 (L) 01/02/2024 0304   FERRITIN 921 (H) 01/02/2024 0304   IRONPCTSAT 44 (H) 01/02/2024 0304  No iron deficiency     Latest Ref Rng & Units 01/02/2024    3:04 AM 01/01/2024    3:12 AM 12/31/2023    4:05 AM  CBC  WBC 4.0 - 10.5 K/uL 4.5  5.2  4.7   Hemoglobin 12.0 - 15.0 g/dL 8.8  8.7  8.3   Hematocrit 36.0 - 46.0 % 28.1  27.8  26.8   Platelets 150 - 400 K/uL 187  194  182      Hypomagnesemia Resolved  Hypokalemia Resolved  Hypophosphatemia supplement Hypocalcemia in the setting of hypoalbuminemia Resolved     Latest Ref Rng &  Units 01/03/2024    4:43 AM 01/02/2024    3:04 AM 01/01/2024    3:12 AM  CMP  Glucose 70 - 99 mg/dL 732  866  866   BUN 6 - 20 mg/dL 13  16  16    Creatinine 0.44 - 1.00 mg/dL 9.37  9.24  9.32   Sodium 135 - 145 mmol/L 133  137  134   Potassium 3.5 - 5.1 mmol/L 5.0  4.6  4.5   Chloride 98 - 111 mmol/L 105  107  105   CO2 22 - 32 mmol/L 22  21  22    Calcium  8.9 - 10.3 mg/dL 8.3  7.9  7.2   Total Protein 6.5 - 8.1 g/dL 6.7  6.1    Total Bilirubin 0.0 - 1.2 mg/dL 0.6  0.6    Alkaline Phos 38 - 126 U/L 556  529    AST 15 - 41 U/L 26  31    ALT 0 - 44 U/L 57  83     Calcium  improved.       Subjective: Last vomited yesterday. About to eat breakfast now but no N/V.  Had some flatus but no BM     Physical Exam: BP 133/82 (BP Location: Left Arm)   Pulse 67   Temp 97.7 F (36.5 C) (Oral)   Resp 20   Ht 5' 11 (1.803 m)   Wt 71 kg   SpO2 100%   BMI 21.83 kg/m       Physical Exam  Constitutional: In no distress.  Cardiovascular: Normal rate, regular rhythm. No lower extremity edema  Pulmonary: Non labored breathing on Rentz, no wheezing or rales.  Abdominal: Soft. Non distended and non tender, incision sites with no drainage or surrounding erythema Musculoskeletal: Normal range of motion.     Neurological: Alert and oriented to person, place, and time. Non focal  Skin: Skin is warm and dry.   Data Reviewed:    Latest Ref Rng & Units 01/02/2024    3:04 AM 01/01/2024    3:12 AM 12/31/2023    4:05 AM  CBC  WBC 4.0 - 10.5 K/uL 4.5  5.2  4.7   Hemoglobin 12.0 - 15.0 g/dL 8.8  8.7  8.3   Hematocrit 36.0 - 46.0 % 28.1  27.8  26.8   Platelets 150 - 400 K/uL 187  194  182    Hgb stable.   Nl creatinine. Calcium  improving.    Family Communication: Patient aware of plan, noted no questions, and verbalized understanding of the plan. Sons and daughter in law at bedside.     Disposition: Status is: Inpatient 53 year old female with metastatic breast cancer and what appears to be new pancreatic cancer  Oncology, GI, and general surgery are consulted and have seen the patient  Remains on TPN, advance diet per surgery will defer to oncology what future imaging can help delineate the nature of the pancreatic mass  SNF v [facility]        Author: Alban Pepper, MD 01/03/2024 9:58 AM  For on call review [email or web address].

## 2024-01-03 NOTE — Evaluation (Signed)
 Physical Therapy Re-Evaluation Patient Details Name: Chelsea Hansen MRN: [id number] DOB: [birth year] Today's Date: 01/03/2024  History of Present Illness  Pt is a 53 y.o. female presenting with nausea and vomiting and admitted for acute kidney injury on 12/22/23.  Past medical history significant of breast cancer, Duodenal stenosis, esophagitis Constipation, HLD, HTN , anemia, DVT, asthma, SVT. S/P LAPAROSCOPIC CREATION GASTRJEJONOSTOMY 01/01/24  Clinical Impression   The patient is 1 day S/P procedure. Patient  did ambulate x 160' using IV pole to steady. Patient should progress to return to independent/modified  independent level, patient may be safer with a RW initially.        If plan is discharge home, recommend the following: A little help with bathing/dressing/bathroom;Assistance with cooking/housework;Assist for transportation;Supervision due to cognitive status   Can travel by private vehicle        Equipment Recommendations None recommended by PT  Recommendations for Other Services       Functional Status Assessment Patient has had a recent decline in their functional status and demonstrates the ability to make significant improvements in function in a reasonable and predictable amount of time.     Precautions / Restrictions Precautions Precautions: Fall Recall of Precautions/Restrictions: Intact Restrictions Weight Bearing Restrictions Per Provider Order: No      Mobility  Bed Mobility Overal bed mobility: Independent       Supine to sit: HOB elevated          Transfers Overall transfer level: Independent   Transfers: Sit to/from Stand Sit to Stand: Independent           General transfer comment: getting to [facility] with no assistance    Ambulation/Gait Ambulation/Gait assistance: Contact guard assist Gait Distance (Feet): 160 Feet Assistive device: IV Pole Gait Pattern/deviations: Decreased stride length, Step-through pattern Gait velocity:  decreased     General Gait Details: pt pushed IV pole for more support (declined using RW although she has one at home), no overt LOB or unsteadiness observed  Stairs            Wheelchair Mobility     Tilt Bed    Modified Rankin (Stroke Patients Only)       Balance Overall balance assessment: Mild deficits observed, not formally tested                                           Pertinent Vitals/Pain Pain Assessment Pain Assessment: No/denies pain    Home Living Family/patient expects to be discharged to:: Private residence Living Arrangements: Children Available Help at Discharge: Family Type of Home: Apartment Home Access: Stairs to enter Entrance Stairs-Rails: Can reach both Entrance Stairs-Number of Steps: 7   Home Layout: Two level;Able to live on main level with bedroom/bathroom;1/2 bath on main level Home Equipment: BSC/3in1;Tub bench;Rolling Walker (2 wheels) Additional Comments: plans to discharge to her daughter's home    Prior Function Prior Level of Function : Independent/Modified Independent;Working/employed;Driving                     Extremity/Trunk Assessment   Upper Extremity Assessment Upper Extremity Assessment: Overall WFL for tasks assessed    Lower Extremity Assessment Lower Extremity Assessment: Overall WFL for tasks assessed    Cervical / Trunk Assessment Cervical / Trunk Assessment: Normal Cervical / Trunk Exceptions: mets in spine  Communication   Communication Communication: No apparent difficulties  Cognition Arousal: Alert Behavior During Therapy: WFL for tasks assessed/performed   PT - Cognitive impairments: No apparent impairments                         Following commands: Intact       Cueing Cueing Techniques: Verbal cues     General Comments      Exercises     Assessment/Plan    PT Assessment Patient needs continued PT services  PT Problem List Decreased  strength;Decreased activity tolerance;Decreased mobility;Decreased knowledge of use of DME       PT Treatment Interventions DME instruction;Gait training;Stair training;Functional mobility training;Therapeutic activities;Therapeutic exercise;Patient/family education    PT Goals (Current goals can be found in the Care Plan section)  Acute Rehab PT Goals Patient Stated Goal: to get better PT Goal Formulation: With patient Time For Goal Achievement: 01/17/24 Potential to Achieve Goals: Good    Frequency Min 3X/week     Co-evaluation               AM-PAC PT 6 Clicks Mobility  Outcome Measure Help needed turning from your back to your side while in a flat bed without using bedrails?: None Help needed moving from lying on your back to sitting on the side of a flat bed without using bedrails?: None Help needed moving to and from a bed to a chair (including a wheelchair)?: None Help needed standing up from a chair using your arms (e.g., wheelchair or bedside chair)?: None Help needed to walk in hospital room?: A Little Help needed climbing 3-5 steps with a railing? : A Little 6 Click Score: 22    End of Session   Activity Tolerance: Patient tolerated treatment well Patient left: in bed;with call bell/phone within reach;with nursing/sitter in room Nurse Communication: Mobility status PT Visit Diagnosis: Muscle weakness (generalized) (M62.81);Difficulty in walking, not elsewhere classified (R26.2)    Time: 1204-1223 PT Time Calculation (min) (ACUTE ONLY): 19 min   Charges:     PT Treatments $Gait Training: 8-22 mins PT General Charges $$ ACUTE PT VISIT: 1 Visit         Darice Potters PT Acute Rehabilitation Services Office [phone]   Potters Darice Norris 01/03/2024, 2:56 PM

## 2024-01-03 NOTE — Progress Notes (Signed)
 Nutrition Follow-up  DOCUMENTATION CODES:   Not applicable  INTERVENTION:  - Plan to continue goal TPN at this time.   - TPN management per Pharmacy  - Full Liquid diet per Surgery.  - Add Ensure Max po BID, each supplement provides 150 kcal and 30 grams of protein.    - Continue Vitamin D  supplementation.  - Patient receiving multivitamin in her TPN.  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 *ongoing  GOAL:   Patient will meet greater than or equal to 90% of their needs *met with TPN  MONITOR:   PO intake, Supplement acceptance, Diet advancement, Labs, Weight trends  REASON FOR ASSESSMENT:   Consult New TPN/TNA  ASSESSMENT:   53 y.o. 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  7/3: admitted, clear liquids 7/5: FLD 7/6: CLD 7/7: NPO, s/p EUS 7/8: CLD, s/p EGD 7/9: TPN initiated 7/10: NPO 7/11: NGT placed for LIS 7/12: TPN advanced to goal 7/14: s/p gastrojejonostomy 7/15: FLD  Patient in bed at time of visit. FLD breakfast tray at bedside. Patient reports having had a few bites/sips of items. No nausea or vomiting so far. She is agreeable to try Ensure Max to support oral intake.  No feeding tube was placed during OR yesterday.  Per Surgery, plan to continue goal TPN for now and full liquid diet.    Admission weight:  169 lbs Current weight: 156 lbs I&O's: +6.6L since admit *Per chart review, patient has lost 50# (25%) in <3 months, which is significant for the time frame.   Medications: Reglan , Protonix , 50,000 units Vitamin D  weekly   Labs reviewed: Na 133 Phosphorus 1.5 Triglycerides 261 (as of 7/14)   Diet Order:   Diet Order             Diet full liquid Room service appropriate? Yes; Fluid consistency: Thin  Diet effective now                   EDUCATION NEEDS:  Education needs have been addressed  Skin:  Skin Assessment: Reviewed RN Assessment  Last BM:  7/11  Height:  Ht Readings from Last 1 Encounters:  12/27/23 5' 11 (1.803 m)   Weight:  Wt Readings from Last 1 Encounters:  01/03/24 71 kg   Ideal Body Weight:  70 kg  BMI:  Body mass index is 21.83 kg/m.  Estimated Nutritional Needs:  Kcal:  2000-2300kcal/day Protein:  100-115g/day Fluid:  2.1-2.4L/day    Trude Ned RD, LDN Contact via Secure Chat.

## 2024-01-03 NOTE — Progress Notes (Signed)
 1 Day Post-Op   Subjective/Chief Complaint: Doing well POD1   Objective: Vital signs in last 24 hours: Temp:  [96.4 F (35.8 C)-98.5 F (36.9 C)] 97.7 F (36.5 C) (07/15 0820) Pulse Rate:  [65-118] 67 (07/15 0820) Resp:  [12-22] 20 (07/15 0839) BP: (92-172)/(72-111) 133/82 (07/15 0820) SpO2:  [97 %-100 %] 100 % (07/15 0820) FiO2 (%):  [28 %] 28 % (07/15 0441) Weight:  [71 kg] 71 kg (07/15 0454) Last BM Date : 12/30/23  Intake/Output from previous day: 07/14 0701 - 07/15 0700 In: 3201.7 [I.V.:2251.7; IV Piggyback:950] Out: 3120 [Urine:3100; Blood:20] Intake/Output this shift: No intake/output data recorded.  Ab soft nontender, incisions clean dry intact with glue  Lab Results:  Recent Labs    01/01/24 0312 01/02/24 0304  WBC 5.2 4.5  HGB 8.7* 8.8*  HCT 27.8* 28.1*  PLT 194 187   BMET Recent Labs    01/02/24 0304 01/03/24 0443  NA 137 133*  K 4.6 5.0  CL 107 105  CO2 21* 22  GLUCOSE 133* 267*  BUN 16 13  CREATININE 0.75 0.62  CALCIUM  7.9* 8.3*   PT/INR No results for input(s): LABPROT, INR in the last 72 hours. ABG No results for input(s): PHART, HCO3 in the last 72 hours.  Invalid input(s): PCO2, PO2  Studies/Results: No results found.   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   Chelsea Hansen 01/03/2024

## 2024-01-03 NOTE — Plan of Care (Signed)

## 2024-01-03 NOTE — Progress Notes (Signed)
 PHARMACY - TOTAL PARENTERAL NUTRITION CONSULT NOTE   Indication: Pancreatic mass with duodenal stricture/obstruction  Patient Measurements: Height: 5' 11 (180.3 cm) Weight: 71 kg (156 lb 8.4 oz) IBW/kg (Calculated) : 70.8 TPN AdjBW (KG): 69.6 Body mass index is 21.83 kg/m. Usual Weight: 92.5 kg in April of this year  Assessment:  Pharmacy consulted to manage TPN for this 53 yo needing nutritional support while awaiting surgical intervention for pancreatic mass with duodenal stricture/obstruction in the setting of metastatic breast cancer resulting in poor oral intake, persistent nausea and vomiting,  and weight loss.  Glucose / Insulin :  no Hx DM. BG goal <180 - CBGs 151-267 in last 24 hrs - 7 units of insulin  used in the last 24 hrs - Decadron  5mg  IV x1 given 7/14 afternoon Electrolytes: Na 133 (slightly low), K 5.0 (higher end of normal range), Phos 1.5 (low), Mg 1.7 (lower end of normal range) Renal: SCr and BUN WNL  Hepatic: alk phos elevated and rising, AST WNL, ALT elevated but trending down, Total Bilirubin WNL, triglycerides elevated at 261 but stable since last checked Intake / Output; MIVF:  NGT placed 7/11 MIVF: none Output: Urine , Emesis/NG none recorded  LBM: 7/11 GI Imaging: 7/4 MR abdomen MRCP: pancreatic mass, vertebral metastases, pancolonic diverticulosis 7/10 CT: pancreatic head equivocal fullness, small amount free pelvic fluid, widespread osseous metastatic disease, small hiatal hernia, colonic diverticulosis GI Surgeries / Procedures:  7/8 EGD 7/7 EUS, upper GI tract 7/14: bypass surgery, possible g-tube planned for this week per notes  Central access: PICC placed 7/9 TPN start date: 7/9  Nutritional Goals:  Goal TPN rate is 90 mL/hr (provides 104 g of protein and 2238 kcals per day)  RD Assessment: Estimated Needs Total Energy Estimated Needs: 2000-2300kcal/day Total Protein Estimated Needs: 100-115g/day Total Fluid Estimated Needs:  2.1-2.4L/day  Current Nutrition:  Advance to FLD 7/15, per surgery TPN  Plan: Now: give sodium phos IV 15mmol x1  Continue TPN at goal rate of 90 mL/hr at 1800 Electrolytes in TPN:  Na 120 mEq/L - increased K 0 mEq/L - decreased Ca 8 mEq/L Mg 8 mEq/L - increased Phos 25 mmol/L - increased Cl:Ac 1:1 Add standard MVI and trace elements to TPN Thiamine  100 mg IV daily in TPN Increase to sensitive q4h SSI and adjust as needed  Monitor TPN labs on Mon/Thurs, and PRN F/u further advancement of diet and possible discontinuation of TPN later this week (maybe tomorrow if patient tolerates FLD?)   Thank you for allowing pharmacy to be a part of this patient's care.  Lacinda Moats, PharmD Clinical Pharmacist  7/15/20257:12 AM

## 2024-01-04 DIAGNOSIS — E876 Hypokalemia: Secondary | ICD-10-CM | POA: Diagnosis not present

## 2024-01-04 DIAGNOSIS — B3781 Candidal esophagitis: Secondary | ICD-10-CM | POA: Diagnosis not present

## 2024-01-04 DIAGNOSIS — N179 Acute kidney failure, unspecified: Secondary | ICD-10-CM | POA: Diagnosis not present

## 2024-01-04 DIAGNOSIS — K315 Obstruction of duodenum: Secondary | ICD-10-CM | POA: Diagnosis not present

## 2024-01-04 LAB — GLUCOSE, CAPILLARY
Glucose-Capillary: 113 mg/dL — ABNORMAL HIGH (ref 70–99)
Glucose-Capillary: 112 mg/dL — ABNORMAL HIGH (ref 70–99)
Glucose-Capillary: 88 mg/dL (ref 70–99)
Glucose-Capillary: 86 mg/dL (ref 70–99)
Glucose-Capillary: 124 mg/dL — ABNORMAL HIGH (ref 70–99)

## 2024-01-04 LAB — COMPREHENSIVE METABOLIC PANEL WITH GFR
ALT: 43 U/L (ref 0–44)
AST: 24 U/L (ref 15–41)
Albumin: 3 g/dL — ABNORMAL LOW (ref 3.5–5.0)
Alkaline Phosphatase: 580 U/L — ABNORMAL HIGH (ref 38–126)
Anion gap: 7 (ref 5–15)
BUN: 12 mg/dL (ref 6–20)
CO2: 23 mmol/L (ref 22–32)
Calcium: 8 mg/dL — ABNORMAL LOW (ref 8.9–10.3)
Chloride: 103 mmol/L (ref 98–111)
Creatinine, Ser: 0.56 mg/dL (ref 0.44–1.00)
GFR, Estimated: >60 mL/min (ref >60)
Glucose, Bld: 119 mg/dL — ABNORMAL HIGH (ref 70–99)
Potassium: 3.8 mmol/L (ref 3.5–5.1)
Sodium: 133 mmol/L — ABNORMAL LOW (ref 135–145)
Total Bilirubin: 0.5 mg/dL (ref 0.0–1.2)
Total Protein: 6.3 g/dL — ABNORMAL LOW (ref 6.5–8.1)

## 2024-01-04 LAB — MAGNESIUM: Magnesium: 1.5 mg/dL — ABNORMAL LOW (ref 1.7–2.4)

## 2024-01-04 LAB — PHOSPHORUS: Phosphorus: 2.3 mg/dL — ABNORMAL LOW (ref 2.5–4.6)

## 2024-01-04 MED ORDER — SODIUM PHOSPHATES 45 MMOLE/15ML IV SOLN
15.0000 mmol | Freq: Once | INTRAVENOUS | Status: AC
  Administered 2024-01-04: 15 mmol via INTRAVENOUS
  Filled 2024-01-04: qty 5

## 2024-01-04 MED ORDER — MAGNESIUM SULFATE 2 GM/50ML IV SOLN
2.0000 g | Freq: Once | INTRAVENOUS | Status: AC
  Administered 2024-01-04: 2 g via INTRAVENOUS
  Filled 2024-01-04: qty 50

## 2024-01-04 MED ORDER — TRAVASOL 10 % IV SOLN
INTRAVENOUS | Status: AC
  Filled 2024-01-04: qty 1036.8

## 2024-01-04 MED ORDER — MAGNESIUM SULFATE 2 GM/50ML IV SOLN: 2.0000 g | Freq: Once | INTRAVENOUS | Status: DC

## 2024-01-04 MED ORDER — DEXTROSE 10 % IV SOLN: INTRAVENOUS | Status: AC

## 2024-01-04 NOTE — Progress Notes (Signed)
 Fentanyl  syringe replaced in PCA pump.  1.6 mL wasted with Diamone RN in 4W 2 med room stericycle.

## 2024-01-04 NOTE — Progress Notes (Signed)
 2 Days Post-Op   Subjective/Chief Complaint: Doing well POD2, some vomiting late last night after tolerating food well all day   Objective: Vital signs in last 24 hours: Temp:  [97.5 F (36.4 C)-99 F (37.2 C)] 98.6 F (37 C) (07/16 0349) Pulse Rate:  [70-78] 74 (07/16 0349) Resp:  [14-18] 16 (07/16 0850) BP: (111-135)/(70-86) 111/70 (07/16 0349) SpO2:  [98 %-100 %] 100 % (07/16 0349) Weight:  [80.9 kg] 80.9 kg (07/16 0500) Last BM Date : 12/30/23  Intake/Output from previous day: 07/15 0701 - 07/16 0700 In: 2632.9 [P.O.:360; I.V.:1814.9; IV Piggyback:458] Out: 2600 [Urine:2600] Intake/Output this shift: No intake/output data recorded.  Ab soft nontender, incisions clean dry intact with glue  Lab Results:  Recent Labs    01/02/24 0304 01/03/24 1036  WBC 4.5 6.4  HGB 8.8* 7.9*  HCT 28.1* 26.1*  PLT 187 195   BMET Recent Labs    01/03/24 0443 01/04/24 0351  NA 133* 133*  K 5.0 3.8  CL 105 103  CO2 22 23  GLUCOSE 267* 119*  BUN 13 12  CREATININE 0.62 0.56  CALCIUM  8.3* 8.0*   PT/INR No results for input(s): LABPROT, INR in the last 72 hours. ABG No results for input(s): PHART, HCO3 in the last 72 hours.  Invalid input(s): PCO2, PO2  Studies/Results: DG CHEST PORT 1 VIEW Result Date: 01/03/2024 EXAM: 1 VIEW XRAY OF THE CHEST 01/03/2024 09:48:00 PM COMPARISON: Radiographs Dec 10 2023 CLINICAL HISTORY: Encounter for central line care. PICC line position. FINDINGS: LUNGS AND PLEURA: The lungs are clear. No pneumothorax. HEART AND MEDIASTINUM: Stable cardiomediastinal silhouette. BONES AND SOFT TISSUES: No acute osseous abnormality. LINES AND TUBES: Right PICC tip in the low SVC. IMPRESSION: 1. Right PICC tip in the low SVC. 2. Clear lungs without pneumothorax. 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   Reglan  When she is not vomiting, she will likely need calorie counts to ensure she is adequately nourished prior to leaving the hospital.  She may want to go home on full liquid diet with supplements while she heals up this anastomosis.  -CA 19-9 is 465   FEN -full liquid diet, Ensure, TPN VTE - ok for chemical prophylaxis  HTN HLD protein calorie nutrition - PICC, TPN   I reviewed hospitalist notes, last 24 h vitals and pain scores, last 48 h intake and output, last 24 h labs and trends, and last 24 h imaging results.   Deward PARAS Willis Kuipers 01/04/2024

## 2024-01-04 NOTE — Progress Notes (Addendum)
 PHARMACY - TOTAL PARENTERAL NUTRITION CONSULT NOTE   Indication: Pancreatic mass with duodenal stricture/obstruction  Patient Measurements: Height: 5' 11 (180.3 cm) Weight: 80.9 kg (178 lb 6.4 oz) IBW/kg (Calculated) : 70.8 TPN AdjBW (KG): 69.6 Body mass index is 24.88 kg/m. Usual Weight: 92.5 kg in April of this year  Assessment:  Pharmacy consulted to manage TPN for this 53 yo needing nutritional support while awaiting surgical intervention for pancreatic mass with duodenal stricture/obstruction in the setting of metastatic breast cancer resulting in poor oral intake, persistent nausea and vomiting,  and weight loss.  Glucose / Insulin :  no Hx DM. BG goal <180 - CBGs 91-242 in last 24 hrs - 12 units of insulin  used in the last 24 hrs - TPN occluded 7/15 overnight and D10 infusion started @90ml /hr Electrolytes: Na 133 (slightly low), Phos 2.3 (low but improved), Mg 1.5 (low), other lytes WNL Renal: SCr and BUN WNL  Hepatic: alk phos elevated and rising, LFTs WNL, Total Bilirubin WNL, triglycerides elevated at 261 but stable since last checked Intake / Output; MIVF:  NGT placed 7/11 MIVF: D10 @90  mL/hr PO: 360 mL Output: Urine 2600 mL, Emesis/NG x2 LBM: 7/11 GI Imaging: 7/4 MR abdomen MRCP: pancreatic mass, vertebral metastases, pancolonic diverticulosis 7/10 CT: pancreatic head equivocal fullness, small amount free pelvic fluid, widespread osseous metastatic disease, small hiatal hernia, colonic diverticulosis GI Surgeries / Procedures:  7/8 EGD 7/7 EUS, upper GI tract 7/14: bypass surgery, possible g-tube planned for this week per notes  Central access: PICC placed 7/9 TPN start date: 7/9  Nutritional Goals:  Goal TPN rate is 90 mL/hr (provides 104 g of protein and 2238 kcals per day)  RD Assessment: Estimated Needs Total Energy Estimated Needs: 2000-2300kcal/day Total Protein Estimated Needs: 100-115g/day Total Fluid Estimated Needs: 2.1-2.4L/day  Current  Nutrition:  Advance to FLD 7/15, per surgery TPN - bag occluded 7/15 overnight and was stopped, D10 infusion started   Plan: Now:  Give Mg 2g IV x1 Give sodium phos 15 mmol IV x1, per MD  Continue TPN at goal rate of 90 mL/hr at 1800 Electrolytes in TPN: most electrolytes remain low today given early discontinuation of yesterday's TPN (see above) Na 120 mEq/L - supplementing outside of TPN K 20 mEq/L - increased Ca 10 mEq/L - increased Mg 8 mEq/L - supplementing outside of TPN Phos 25 mmol/L - supplementing outside of TPN Cl:Ac 1:1 Add standard MVI and trace elements to TPN Discontinue thiamine  100mg  IV daily, per RD recommendations D10 infusion to stop at 1800 tonight Continue sensitive q4h SSI and adjust as needed  Monitor TPN labs on Mon/Thurs, and PRN F/u further advancement of diet and possible discontinuation of TPN later this week   Thank you for allowing pharmacy to be a part of this patient's care.  Lacinda Moats, PharmD Clinical Pharmacist  7/16/20257:06 AM

## 2024-01-04 NOTE — Progress Notes (Signed)
 Mobility Specialist - Progress Note   01/04/24 1300  Mobility  Activity Ambulated with assistance in hallway  Level of Assistance Modified independent, requires aide device or extra time  Assistive Device Other (Comment) (IV pole)  Distance Ambulated (ft) 350 ft  Range of Motion/Exercises Active  Activity Response Tolerated well  Mobility Referral Yes  Mobility visit 1 Mobility  Mobility Specialist Start Time (ACUTE ONLY) 1300  Mobility Specialist Stop Time (ACUTE ONLY) 1315  Mobility Specialist Time Calculation (min) (ACUTE ONLY) 15 min   Received in bed and agreed to mobility. No hands on throughout session. Returned to bed with all needs met.  Cyndee Ada Mobility Specialist

## 2024-01-04 NOTE — Progress Notes (Signed)
 Pt requested a Sprite drink, took a few small sips then few minutes after, pt had another episode of nausea and vomiting. Vomiting large amount of dark green emesis in waste basket. Approximate total emesis about 200- 300 ml asked pt to vomit in emesis container if occurs again to collect accurate amount. SRP, RN

## 2024-01-04 NOTE — Progress Notes (Signed)
 Pt had 600 mL green emesis.

## 2024-01-04 NOTE — Progress Notes (Signed)
 PROGRESS NOTE    Chelsea Hansen  FMW:[id number] DOB: February 07, 1971 DOA: 12/22/2023 PCP: [name], NP   Chief Complaint  Patient presents with   Abdominal Pain   Emesis    Brief Narrative:  53 y.o. 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   Imaging showed a duodenal obstruction and masslike area of the pancreas.  GI and General Surgery consulted.     EUS attempted on 7/7, but unsuccessful secondary to duodenal stenosis.     Repeat EGD/EUS on 7/9 again unsuccessful in biopsy.  Gen surg consulted and NG tube was placed, patient started on TPN, patient subsequently underwent laparoscopic creation of gastrojejunostomy for duodenal stricture.  Patient started on clear liquids and diet being advanced per general surgery.      Assessment & Plan:   Principal Problem:   AKI (acute kidney injury) (HCC) Active Problems:   Hypokalemia   Acute kidney injury superimposed on chronic kidney disease (HCC)   Hypocalcemia   Breast cancer metastasized to bone, right (HCC)   HLD (hyperlipidemia)   Constipation   Dehydration   Prolonged QT interval   Acute esophagitis   Candida esophagitis (HCC)   Transaminitis   Pancreatic mass   Right sided weakness   Hypotension   Abnormal CT of the abdomen   Elevated liver enzymes   Acute kidney injury (HCC)   Abnormal MRI of abdomen   Pancreatic lesion   Unintentional weight loss   Nausea and vomiting   Duodenal obstruction   Hypophosphatemia   Hypomagnesemia  #1 pancreatic mass with duodenal stricture/SBO in the setting of pancreatic mass -Patient noted to have a new mass noted on imaging of the pancreas noted on CT abdomen and pelvis. - Patient underwent MRCP which was concerning for expansive, masslike appearance of the pancreatic head and uncinate as noted on prior CT measuring 2.5 x 2.3 cm which was noted to be new and  interval to prior restaging PET/CT dated 06/10/2023 and highly concerning for pancreatic adenocarcinoma. -CA 19-9 noted at 465. - Patient seen in consultation by GI however unfortunately EUS which was attempted on 7 7 was unsuccessful secondary to duodenal stenosis with repeat EGD/EUS done on 7 /9 noted to be unsuccessful and unable to get a biopsy. - General Surgery was consulted and patient underwent a gastrojejunal bypass surgery on 7/14 with no concerning nodules or evidence of metastatic disease on diagnostic laparoscopy. - Patient currently on TPN with diet advancement per general surgery. - Patient noted to have tolerated full liquid diet on 01/03/2024 however around 8 PM noted to had episode of emesis. -Continue scheduled Reglan  10 mg every 6 hours. -Continue IV PPI. - Patient tolerated full liquid diet this morning. - continue full liquid diet, Ensure, TPN as per general surgery. -Continue current pain control with PCA pump. - General Surgery following and appreciate the input and recommendations.  2.  Hypertension -BP controlled. - Continue to hold home antihypertensive.  3.  Moderate protein calorie malnutrition -Currently on TPN.  4.  Candida esophagitis -Status post course of fluconazole .  5.  Chronic asthma -Stable.  6.  AKI on CKD stage 3a - Patient noted to have a creatinine of 3.2 on admission which improved with hydration currently resolved with creatinine currently at 0.56.  7.  Hypophosphatemia/hypomagnesemia -Being repleted per pharmacy as patient on TPN.   DVT prophylaxis: Lovenox  Code Status: Full Family Communication: Updated  patient. Disposition: TBD  Status is: Inpatient Remains inpatient appropriate because: Severity of illness   Consultants:  Gastroenterology: Dr. Albertus 12/23/2023 General Surgery: Dr. Vernetta 12/27/2023 Dr. Odean via epic  Procedures:  CT abdomen and pelvis 12/29/2023 Abdominal films 12/24/2023, 12/28/2023, 12/30/2023 Chest x-ray  01/03/2024 PICC line placement MRCP 12/23/2023 Right upper quadrant ultrasound 12/22/2023 EGD/EUS 12/26/2023 by Dr. Wilhelmenia Laparoscopic creation gastrojejunostomy by general surgery: Dr. Lyndel 01/02/2024  Antimicrobials:  Anti-infectives (From admission, onward)    Start     Dose/Rate Route Frequency Ordered Stop   01/02/24 1245  ceFAZolin  (ANCEF ) 2-4 GM/100ML-% IVPB       Note to Pharmacy: Kathern Quant: cabinet override      01/02/24 1245 01/03/24 0059   12/22/23 2230  fluconazole  (DIFLUCAN ) IVPB 200 mg  Status:  Discontinued        200 mg 100 mL/hr over 60 Minutes Intravenous Every 24 hours 12/22/23 2132 01/03/24 1126         Subjective: Patient lying in bed.  Noted to have tolerated full liquid diet throughout the day yesterday had a bout of nausea and emesis around 8 PM.  Fuses due to Reglan  and muscle relaxant being given at the same time which has been spaced out today with no nausea or vomiting.  Patient noted to have tolerated full liquid diet this morning.  Denies any chest pain or shortness of breath.  No flatus.  No bowel movement.  Objective: Vitals:   01/04/24 1154 01/04/24 1244 01/04/24 1412 01/04/24 1939  BP:  106/64  96/60  Pulse:  70  83  Resp: 18  16 16   Temp:  98.6 F (37 C)  99.1 F (37.3 C)  TempSrc:  Oral  Oral  SpO2:    100%  Weight:      Height:        Intake/Output Summary (Last 24 hours) at 01/04/2024 2030 Last data filed at 01/04/2024 1700 Gross per 24 hour  Intake 482.69 ml  Output 2050 ml  Net -1567.31 ml   Filed Weights   01/02/24 0500 01/03/24 0454 01/04/24 0500  Weight: 70 kg 71 kg 80.9 kg    Examination:  General exam: Appears calm and comfortable  Respiratory system: Clear to auscultation. Respiratory effort normal. Cardiovascular system: S1 & S2 heard, RRR. No JVD, murmurs, rubs, gallops or clicks. No pedal edema. Gastrointestinal system: Abdomen is nondistended, soft and some tenderness to palpation around incision  sites and right lower quadrant and suprapubic abdominal region.  Hypoactive bowel sounds.  No rebound.  No guarding.  Central nervous system: Alert and oriented. No focal neurological deficits. Extremities: Symmetric 5 x 5 power. Skin: No rashes, lesions or ulcers Psychiatry: Judgement and insight appear normal. Mood & affect appropriate.     Data Reviewed: I have personally reviewed following labs and imaging studies  CBC: Recent Labs  Lab 12/30/23 0500 12/31/23 0405 01/01/24 0312 01/02/24 0304 01/03/24 1036  WBC 5.3 4.7 5.2 4.5 6.4  NEUTROABS  --   --   --   --  4.1  HGB 8.2* 8.3* 8.7* 8.8* 7.9*  HCT 26.8* 26.8* 27.8* 28.1* 26.1*  MCV 74.2* 74.4* 75.1* 75.3* 75.2*  PLT 177 182 194 187 195    Basic Metabolic Panel: Recent Labs  Lab 12/31/23 0405 01/01/24 0312 01/02/24 0304 01/03/24 0443 01/04/24 0351  NA 136 134* 137 133* 133*  K 3.8 4.5 4.6 5.0 3.8  CL 107 105 107 105 103  CO2 22 22 21* 22 23  GLUCOSE 116* 133* 133* 267* 119*  BUN 12 16 16 13 12   CREATININE 0.66 0.67 0.75 0.62 0.56  CALCIUM  6.8* 7.2* 7.9* 8.3* 8.0*  MG 1.6* 2.2 1.9 1.7 1.5*  PHOS 3.1 2.7 2.7 1.5* 2.3*    GFR: Estimated Creatinine Clearance: 93 mL/min (by C-G formula based on SCr of 0.56 mg/dL).  Liver Function Tests: Recent Labs  Lab 12/29/23 0405 01/02/24 0304 01/03/24 0443 01/04/24 0351  AST 76* 31 26 24   ALT 116* 83* 57* 43  ALKPHOS 333* 529* 556* 580*  BILITOT 0.6 0.6 0.6 0.5  PROT 6.3* 6.1* 6.7 6.3*  ALBUMIN  3.0* 2.8* 3.1* 3.0*    CBG: Recent Labs  Lab 01/04/24 0349 01/04/24 0730 01/04/24 1117 01/04/24 1602 01/04/24 1940  GLUCAP 113* 112* 88 86 124*     No results found for this or any previous visit (from the past 240 hours).       Radiology Studies: DG CHEST PORT 1 VIEW Result Date: 01/03/2024 EXAM: 1 VIEW XRAY OF THE CHEST 01/03/2024 09:48:00 PM COMPARISON: Radiographs Dec 10 2023 CLINICAL HISTORY: Encounter for central line care. PICC line position.  FINDINGS: LUNGS AND PLEURA: The lungs are clear. No pneumothorax. HEART AND MEDIASTINUM: Stable cardiomediastinal silhouette. BONES AND SOFT TISSUES: No acute osseous abnormality. LINES AND TUBES: Right PICC tip in the low SVC. IMPRESSION: 1. Right PICC tip in the low SVC. 2. Clear lungs without pneumothorax. Electronically signed by: Norman Gatlin MD 01/03/2024 09:57 PM EDT RP Workstation: [id number]        Scheduled Meds:  Chlorhexidine  Gluconate Cloth  6 each Topical Daily   enoxaparin  (LOVENOX ) injection  40 mg Subcutaneous Q24H   fentaNYL    Intravenous Q4H   insulin  aspart  0-9 Units Subcutaneous Q4H   methocarbamol  (ROBAXIN ) injection  500 mg Intravenous Q8H   metoCLOPramide  (REGLAN ) injection  10 mg Intravenous Q6H   pantoprazole  (PROTONIX ) IV  40 mg Intravenous Q12H   Ensure Max Protein  11 oz Oral BID   sodium chloride  flush  10-40 mL Intracatheter Q12H   Vitamin D  (Ergocalciferol )  50,000 Units Oral Q7 days   Continuous Infusions:  TPN ADULT (ION) 90 mL/hr at 01/04/24 1755     LOS: 12 days    Time spent: 40 minutes    Toribio Hummer, MD [facility]   To contact the attending provider between 7A-7P or the covering provider during after hours 7P-7A, please log into the web site [email or web address] and access using universal Baker password for that web site. If you do not have the password, please call the hospital operator.  01/04/2024, 8:30 PM

## 2024-01-04 NOTE — Progress Notes (Addendum)
 Pt nauseated and vomited large amount of green gastric content, (approximate content about 300-400 ml). pt refused Reglan  associating nausea and vomiting with med. Pt states this is 2nd event in which she vomited after taking Reglan . Will continue to monitor. State feels better after vomiting emesis. SRP, RN

## 2024-01-05 ENCOUNTER — Ambulatory Visit: Payer: MEDICAID | Admitting: Physician Assistant

## 2024-01-05 DIAGNOSIS — N179 Acute kidney failure, unspecified: Secondary | ICD-10-CM | POA: Diagnosis not present

## 2024-01-05 DIAGNOSIS — B3781 Candidal esophagitis: Secondary | ICD-10-CM | POA: Diagnosis not present

## 2024-01-05 DIAGNOSIS — E876 Hypokalemia: Secondary | ICD-10-CM | POA: Diagnosis not present

## 2024-01-05 DIAGNOSIS — K315 Obstruction of duodenum: Secondary | ICD-10-CM | POA: Diagnosis not present

## 2024-01-05 LAB — CBC WITH DIFFERENTIAL/PLATELET
Abs Immature Granulocytes: 0.41 K/uL — ABNORMAL HIGH (ref 0.00–0.07)
Basophils Absolute: 0 K/uL (ref 0.0–0.1)
Basophils Relative: 1 %
Eosinophils Absolute: 0.1 K/uL (ref 0.0–0.5)
Eosinophils Relative: 1 %
HCT: 24 % — ABNORMAL LOW (ref 36.0–46.0)
Hemoglobin: 7.4 g/dL — ABNORMAL LOW (ref 12.0–15.0)
Immature Granulocytes: 8 %
Lymphocytes Relative: 28 %
Lymphs Abs: 1.4 K/uL (ref 0.7–4.0)
MCH: 23.1 pg — ABNORMAL LOW (ref 26.0–34.0)
MCHC: 30.8 g/dL (ref 30.0–36.0)
MCV: 75 fL — ABNORMAL LOW (ref 80.0–100.0)
Monocytes Absolute: 0.7 K/uL (ref 0.1–1.0)
Monocytes Relative: 13 %
Neutro Abs: 2.6 K/uL (ref 1.7–7.7)
Neutrophils Relative %: 49 %
Platelets: 188 K/uL (ref 150–400)
RBC: 3.2 MIL/uL — ABNORMAL LOW (ref 3.87–5.11)
RDW: 15.9 % — ABNORMAL HIGH (ref 11.5–15.5)
WBC: 5.2 K/uL (ref 4.0–10.5)
nRBC: 0.4 % — ABNORMAL HIGH (ref 0.0–0.2)

## 2024-01-05 LAB — GLUCOSE, CAPILLARY
Glucose-Capillary: 119 mg/dL — ABNORMAL HIGH (ref 70–99)
Glucose-Capillary: 125 mg/dL — ABNORMAL HIGH (ref 70–99)
Glucose-Capillary: 126 mg/dL — ABNORMAL HIGH (ref 70–99)
Glucose-Capillary: 132 mg/dL — ABNORMAL HIGH (ref 70–99)
Glucose-Capillary: 134 mg/dL — ABNORMAL HIGH (ref 70–99)
Glucose-Capillary: 139 mg/dL — ABNORMAL HIGH (ref 70–99)

## 2024-01-05 LAB — COMPREHENSIVE METABOLIC PANEL WITH GFR
ALT: 36 U/L (ref 0–44)
AST: 35 U/L (ref 15–41)
Albumin: 2.8 g/dL — ABNORMAL LOW (ref 3.5–5.0)
Alkaline Phosphatase: 673 U/L — ABNORMAL HIGH (ref 38–126)
Anion gap: 8 (ref 5–15)
BUN: 16 mg/dL (ref 6–20)
CO2: 23 mmol/L (ref 22–32)
Calcium: 7.8 mg/dL — ABNORMAL LOW (ref 8.9–10.3)
Chloride: 104 mmol/L (ref 98–111)
Creatinine, Ser: 0.69 mg/dL (ref 0.44–1.00)
GFR, Estimated: >60 mL/min (ref >60)
Glucose, Bld: 123 mg/dL — ABNORMAL HIGH (ref 70–99)
Potassium: 3.9 mmol/L (ref 3.5–5.1)
Sodium: 135 mmol/L (ref 135–145)
Total Bilirubin: 0.7 mg/dL (ref 0.0–1.2)
Total Protein: 6 g/dL — ABNORMAL LOW (ref 6.5–8.1)

## 2024-01-05 LAB — MAGNESIUM: Magnesium: 2.2 mg/dL (ref 1.7–2.4)

## 2024-01-05 LAB — PHOSPHORUS: Phosphorus: 3.6 mg/dL (ref 2.5–4.6)

## 2024-01-05 MED ORDER — TRAVASOL 10 % IV SOLN
INTRAVENOUS | Status: AC
  Filled 2024-01-05: qty 1036.8

## 2024-01-05 MED ORDER — HYDROMORPHONE HCL 1 MG/ML IJ SOLN
1.0000 mg | INTRAMUSCULAR | Status: DC | PRN
  Administered 2024-01-05 – 2024-01-07 (×7): 1 mg via INTRAVENOUS
  Filled 2024-01-05 (×7): qty 1

## 2024-01-05 NOTE — Progress Notes (Signed)
 Physical Therapy Treatment Patient Details Name: Chelsea Hansen MRN: [id number] DOB: [birth year] Today's Date: 01/05/2024   History of Present Illness Pt is a 53 y.o. female presenting with nausea and vomiting and admitted for acute kidney injury on 12/22/23.  Past medical history significant of breast cancer, Duodenal stenosis, esophagitis Constipation, HLD, HTN , anemia, DVT, asthma, SVT. S/P LAPAROSCOPIC CREATION GASTRJEJONOSTOMY 01/01/24    PT Comments  Pt supine in bed on arrival.  Pt just reported waking.  Performed BP in supine, sitting and standing with noticeable changes.  Pt required increased assistance and performed decreased activity from previous session.  Plan remains appropriate as low BP was the limiting factor this session.  BP supine: 103/61 ( MAP of 74) BP sitting: 86/60 ( MAP of 61) BP in standing: 101/64 ( MAP of 75)    If plan is discharge home, recommend the following: A little help with bathing/dressing/bathroom;Assistance with cooking/housework;Assist for transportation;Supervision due to cognitive status   Can travel by private vehicle        Equipment Recommendations  None recommended by PT    Recommendations for Other Services       Precautions / Restrictions Precautions Precautions: Fall Recall of Precautions/Restrictions: Intact Precaution/Restrictions Comments: watch BP Restrictions Weight Bearing Restrictions Per Provider Order: No     Mobility  Bed Mobility Overal bed mobility: Needs Assistance Bed Mobility: Supine to Sit     Supine to sit: Supervision     General bed mobility comments: Increased time and effort reports dizziness sitting edge of bed.  Noted drop in BP from supine    Transfers Overall transfer level: Needs assistance Equipment used: Ambulation equipment used (IV pole) Transfers: Sit to/from Stand Sit to Stand: Contact guard assist           General transfer comment: Cues for hand placement, pt is weaker this  session.    Ambulation/Gait Ambulation/Gait assistance: Min assist Gait Distance (Feet): 15 Feet (x2 seated break inbetween trials) Assistive device: IV Pole Gait Pattern/deviations: Decreased stride length, Step-through pattern, Trunk flexed       General Gait Details: Cues for trunk and hip extension, limited due to dizziness despite BP rising in standing.   Stairs             Wheelchair Mobility     Tilt Bed    Modified Rankin (Stroke Patients Only)       Balance     Sitting balance-Leahy Scale: Fair       Standing balance-Leahy Scale: Poor                              Communication Communication Communication: No apparent difficulties  Cognition Arousal: Alert Behavior During Therapy: WFL for tasks assessed/performed   PT - Cognitive impairments: No apparent impairments                         Following commands: Intact      Cueing Cueing Techniques: Verbal cues  Exercises      General Comments        Pertinent Vitals/Pain Pain Assessment Pain Assessment: Faces Faces Pain Scale: Hurts little more Pain Location: stomach Pain Intervention(s): Monitored during session, Repositioned    Home Living                          Prior Function  PT Goals (current goals can now be found in the care plan section) Acute Rehab PT Goals Patient Stated Goal: to get better Potential to Achieve Goals: Good Progress towards PT goals: Progressing toward goals    Frequency    Min 3X/week      PT Plan      Co-evaluation              AM-PAC PT 6 Clicks Mobility   Outcome Measure  Help needed turning from your back to your side while in a flat bed without using bedrails?: A Little Help needed moving from lying on your back to sitting on the side of a flat bed without using bedrails?: A Little Help needed moving to and from a bed to a chair (including a wheelchair)?: A Little Help needed  standing up from a chair using your arms (e.g., wheelchair or bedside chair)?: A Little Help needed to walk in hospital room?: A Little Help needed climbing 3-5 steps with a railing? : A Little 6 Click Score: 18    End of Session Equipment Utilized During Treatment: Gait belt Activity Tolerance: Patient tolerated treatment well Patient left: with call bell/phone within reach;with nursing/sitter in room;in chair;with chair alarm set Nurse Communication: Mobility status PT Visit Diagnosis: Muscle weakness (generalized) (M62.81);Difficulty in walking, not elsewhere classified (R26.2)     Time: 8398-8374 PT Time Calculation (min) (ACUTE ONLY): 24 min  Charges:    $Gait Training: 8-22 mins $Therapeutic Activity: 8-22 mins PT General Charges $$ ACUTE PT VISIT: 1 Visit                     Toya HAMS , PTA Acute Rehabilitation Services Office [phone]    Toya JINNY Gosling 01/05/2024, 4:33 PM

## 2024-01-05 NOTE — Progress Notes (Signed)
 Pt vomited 800 mL green emesis around 0840.

## 2024-01-05 NOTE — Progress Notes (Signed)
 PHARMACY - TOTAL PARENTERAL NUTRITION CONSULT NOTE   Indication: Pancreatic mass with duodenal stricture/obstruction  Patient Measurements: Height: 5' 11 (180.3 cm) Weight: 80.9 kg (178 lb 6.4 oz) IBW/kg (Calculated) : 70.8 TPN AdjBW (KG): 69.6 Body mass index is 24.88 kg/m. Usual Weight: 92.5 kg in April of this year  Assessment:  Pharmacy consulted to manage TPN for this 53 yo needing nutritional support while awaiting surgical intervention for pancreatic mass with duodenal stricture/obstruction in the setting of metastatic breast cancer resulting in poor oral intake, persistent nausea and vomiting,  and weight loss.  Glucose / Insulin :  no Hx DM. BG goal <180 - CBGs improved to 86-134 in last 24 hrs - 3 units of insulin  used in the last 24 hrs Electrolytes: CoCa 8.5 (slightly low, maxed out in TPN), all other lytes WNL Renal: SCr and BUN WNL  Hepatic: albumin  low at 2.8, alk phos elevated and rising 673, LFTs WNL, Total Bilirubin WNL, triglycerides elevated at 261 but stable since last checked Intake / Output; MIVF:  NGT placed 7/11 PO: not recorded Output: Urine 350 mL, Emesis/NG 600 mL LBM: 7/11 GI Imaging: 7/4 MR abdomen MRCP: pancreatic mass, vertebral metastases, pancolonic diverticulosis 7/10 CT: pancreatic head equivocal fullness, small amount free pelvic fluid, widespread osseous metastatic disease, small hiatal hernia, colonic diverticulosis GI Surgeries / Procedures:  7/8 EGD 7/7 EUS, upper GI tract 7/14: bypass surgery, possible g-tube planned for this week per notes  Central access: PICC placed 7/9 TPN start date: 7/9  Nutritional Goals:  Goal TPN rate is 90 mL/hr (provides 104 g of protein and 2238 kcals per day)  RD Assessment: Estimated Needs Total Energy Estimated Needs: 2000-2300kcal/day Total Protein Estimated Needs: 100-115g/day Total Fluid Estimated Needs: 2.1-2.4L/day  Current Nutrition:  -Advanced to FLD 7/15, per surgery - patient refused  Ensure Max feeding supplement x2 yesterday -TPN  Plan: Continue TPN at goal rate of 90 mL/hr at 1800 Electrolytes in TPN:  Na 120 mEq/L K 20 mEq/L Ca 10 mEq/L Mg 8 mEq/L Phos 25 mmol/L Cl:Ac 1:1 Add standard MVI and trace elements to TPN Continue sensitive q4h SSI and adjust as needed  Monitor TPN labs on Mon/Thurs, and PRN F/u further advancement of diet and possible discontinuation of TPN later this week   Thank you for allowing pharmacy to be a part of this patient's care.  Lacinda Moats, PharmD Clinical Pharmacist  7/17/20257:20 AM

## 2024-01-05 NOTE — Progress Notes (Signed)
 3 Days Post-Op   Subjective/Chief Complaint: Patient continues to have nausea and emesis as well as passing flatus. She denies having a BM. She is concerned about the frequency of her emesis. She rates her pain at 10/10 this AM.    Objective: Vital signs in last 24 hours: Temp:  [98.6 F (37 C)-99.6 F (37.6 C)] 99.6 F (37.6 C) (07/17 0352) Pulse Rate:  [70-103] 103 (07/17 0902) Resp:  [15-22] 18 (07/17 0807) BP: (85-108)/(50-67) 94/60 (07/17 0902) SpO2:  [97 %-100 %] 99 % (07/17 0902) FiO2 (%):  [28 %] 28 % (07/17 0058) Weight:  [81.2 kg] 81.2 kg (07/17 0832) Last BM Date : 12/30/23  Intake/Output from previous day: 07/16 0701 - 07/17 0700 In: 1087.2 [I.V.:1087.2] Out: 950 [Urine:350; Emesis/NG output:600] Intake/Output this shift: Total I/O In: -  Out: 800 [Emesis/NG output:800]  Ab soft nontender, incisions clean dry intact with glue  Lab Results:  Recent Labs    01/03/24 1036 01/05/24 0256  WBC 6.4 5.2  HGB 7.9* 7.4*  HCT 26.1* 24.0*  PLT 195 188   BMET Recent Labs    01/04/24 0351 01/05/24 0256  NA 133* 135  K 3.8 3.9  CL 103 104  CO2 23 23  GLUCOSE 119* 123*  BUN 12 16  CREATININE 0.56 0.69  CALCIUM  8.0* 7.8*   PT/INR No results for input(s): LABPROT, INR in the last 72 hours. ABG No results for input(s): PHART, HCO3 in the last 72 hours.  Invalid input(s): PCO2, PO2  Studies/Results: DG CHEST PORT 1 VIEW Result Date: 01/03/2024 EXAM: 1 VIEW XRAY OF THE CHEST 01/03/2024 09:48:00 PM COMPARISON: Radiographs Dec 10 2023 CLINICAL HISTORY: Encounter for central line care. PICC line position. FINDINGS: LUNGS AND PLEURA: The lungs are clear. No pneumothorax. HEART AND MEDIASTINUM: Stable cardiomediastinal silhouette. BONES AND SOFT TISSUES: No acute osseous abnormality. LINES AND TUBES: Right PICC tip in the low SVC. IMPRESSION: 1. Right PICC tip in the low SVC. 2. Clear lungs without pneumothorax. Electronically signed by: Norman Gatlin MD  01/03/2024 09:57 PM EDT RP Workstation: [id number]     Anti-infectives: Anti-infectives (From admission, onward)    Start     Dose/Rate Route Frequency Ordered Stop   01/02/24 1245  ceFAZolin  (ANCEF ) 2-4 GM/100ML-% IVPB       Note to Pharmacy: Kathern Quant: cabinet override      01/02/24 1245 01/03/24 0059   12/22/23 2230  fluconazole  (DIFLUCAN ) IVPB 200 mg  Status:  Discontinued        200 mg 100 mL/hr over 60 Minutes Intravenous Every 24 hours 12/22/23 2132 01/03/24 1126       Assessment/Plan: POD 3- S/P LAPAROSCOPIC CREATION GASTRJEJONOSTOMY, 7/14, Dr. Lyndel   Continue TPN Continue full liquid diet as tolerated, her stomach likely slow to start emptying well with this new anastomosis.   Reglan  When she is not vomiting, she will likely need calorie counts to ensure she is adequately nourished prior to leaving the hospital.  She may want to go home on full liquid diet with supplements while she heals up this anastomosis.  -CA 19-9 is 465   FEN -full liquid diet, Ensure, TPN VTE - ok for chemical prophylaxis  HTN HLD protein calorie nutrition - PICC, TPN   I reviewed hospitalist notes, last 24 h vitals and pain scores, last 48 h intake and output, last 24 h labs and trends, and last 24 h imaging results.   Chelsea Hansen 01/05/2024

## 2024-01-05 NOTE — Progress Notes (Signed)
 PROGRESS NOTE    Chelsea Hansen  FMW:[id number] DOB: [birth year] DOA: 12/22/2023 PCP: [name], NP   Chief Complaint  Patient presents with   Abdominal Pain   Emesis    Brief Narrative:  53 y.o. 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   Imaging showed a duodenal obstruction and masslike area of the pancreas.  GI and General Surgery consulted.     EUS attempted on 7/7, but unsuccessful secondary to duodenal stenosis.     Repeat EGD/EUS on 7/9 again unsuccessful in biopsy.  Gen surg consulted and NG tube was placed, patient started on TPN, patient subsequently underwent laparoscopic creation of gastrojejunostomy for duodenal stricture.  Patient started on clear liquids and diet being advanced per general surgery.      Assessment & Plan:   Principal Problem:   AKI (acute kidney injury) (HCC) Active Problems:   Hypokalemia   Acute kidney injury superimposed on chronic kidney disease (HCC)   Hypocalcemia   Breast cancer metastasized to bone, right (HCC)   HLD (hyperlipidemia)   Constipation   Dehydration   Prolonged QT interval   Acute esophagitis   Candida esophagitis (HCC)   Transaminitis   Pancreatic mass   Right sided weakness   Hypotension   Abnormal CT of the abdomen   Elevated liver enzymes   Acute kidney injury (HCC)   Abnormal MRI of abdomen   Pancreatic lesion   Unintentional weight loss   Nausea and vomiting   Duodenal obstruction   Hypophosphatemia   Hypomagnesemia  #1 pancreatic mass with duodenal stricture/SBO in the setting of pancreatic mass -Patient noted to have a new mass noted on imaging of the pancreas noted on CT abdomen and pelvis. - Patient underwent MRCP which was concerning for expansive, masslike appearance of the pancreatic head and uncinate as noted on prior CT measuring 2.5 x 2.3 cm which was noted to be new and  interval to prior restaging PET/CT dated 06/10/2023 and highly concerning for pancreatic adenocarcinoma. -CA 19-9 noted at 465. - Patient seen in consultation by GI however unfortunately EUS which was attempted on 7 7 was unsuccessful secondary to duodenal stenosis with repeat EGD/EUS done on 7 /9 noted to be unsuccessful and unable to get a biopsy. - General Surgery was consulted and patient underwent a gastrojejunal bypass surgery on 7/14 with no concerning nodules or evidence of metastatic disease on diagnostic laparoscopy. - Patient currently on TPN with diet advancement per general surgery. - Patient noted to have tolerated full liquid diet on 01/03/2024 however around 8 PM noted to had episode of emesis. -Patient noted with emesis this morning. -Continue scheduled Reglan  10 mg every 6 hours. -Continue IV PPI. - continue full liquid diet, Ensure, TPN as per general surgery. - Discontinue fentanyl  PCA pump and placed on IV Dilaudid  as needed severe pain.  -Per general surgery patient for upper GI tomorrow to check gastric emptying via bypass as patient still with bouts of emesis. - General Surgery following and appreciate the input and recommendations.  2.  Hypertension -BP soft/borderline.   - Patient asymptomatic.   - Continue to hold home antihypertensive medications.   3.  Moderate protein calorie malnutrition - On TPN.   - On full liquids.    4.  Candida esophagitis - Status post full course fluconazole .   5.  Chronic asthma -Stable.  6.  AKI on CKD stage  3a - Patient noted to have a creatinine of 3.2 on admission which improved with hydration and resolved, creatinine currently at 0.69.   7.  Hypophosphatemia/hypomagnesemia -Being repleted per pharmacy as patient on TPN.   DVT prophylaxis: Lovenox  Code Status: Full Family Communication: Updated patient. Disposition: TBD  Status is: Inpatient Remains inpatient appropriate because: Severity of illness   Consultants:   Gastroenterology: Dr. Albertus 12/23/2023 General Surgery: Dr. Vernetta 12/27/2023 Dr. Odean via epic  Procedures:  CT abdomen and pelvis 12/29/2023 Abdominal films 12/24/2023, 12/28/2023, 12/30/2023 Chest x-ray 01/03/2024 PICC line placement MRCP 12/23/2023 Right upper quadrant ultrasound 12/22/2023 EGD/EUS 12/26/2023 by Dr. Wilhelmenia Laparoscopic creation gastrojejunostomy by general surgery: Dr. Lyndel 01/02/2024  Antimicrobials:  Anti-infectives (From admission, onward)    Start     Dose/Rate Route Frequency Ordered Stop   01/02/24 1245  ceFAZolin  (ANCEF ) 2-4 GM/100ML-% IVPB       Note to Pharmacy: Kathern Quant: cabinet override      01/02/24 1245 01/03/24 0059   12/22/23 2230  fluconazole  (DIFLUCAN ) IVPB 200 mg  Status:  Discontinued        200 mg 100 mL/hr over 60 Minutes Intravenous Every 24 hours 12/22/23 2132 01/03/24 1126         Subjective: Patient lying in bed.  States had a bout of emesis this morning.  None since.  Still with abdominal pain.  Passing flatus.  No bowel movement.  On full liquids.  Asking whether she needs a suppository.   Objective: Vitals:   01/05/24 1000 01/05/24 1213 01/05/24 1236 01/05/24 1250  BP:  (!) 89/58  (!) 90/59  Pulse: 85 80    Resp:  17 16   Temp:  98.6 F (37 C)    TempSrc:  Oral    SpO2:  98% 98%   Weight:      Height:        Intake/Output Summary (Last 24 hours) at 01/05/2024 1318 Last data filed at 01/05/2024 1100 Gross per 24 hour  Intake 1317.16 ml  Output 1400 ml  Net -82.84 ml   Filed Weights   01/03/24 0454 01/04/24 0500 01/05/24 0832  Weight: 71 kg 80.9 kg 81.2 kg    Examination:  General exam: NAD. Respiratory system: CTAB.  No wheezes, no crackles, no rhonchi.  Fair air movement.  Speaking in full sentences.   Cardiovascular system: Regular rate rhythm no murmurs rubs or gallops.  No JVD.  No lower extremity edema.  Gastrointestinal system: Abdomen is soft, nondistended, some tenderness to palpation  around incision sites and right lower quadrant.  Hypoactive bowel sounds.  No rebound.  No guarding.   Central nervous system: Alert and oriented. No focal neurological deficits. Extremities: Symmetric 5 x 5 power. Skin: No rashes, lesions or ulcers Psychiatry: Judgement and insight appear normal. Mood & affect appropriate.     Data Reviewed: I have personally reviewed following labs and imaging studies  CBC: Recent Labs  Lab 12/31/23 0405 01/01/24 0312 01/02/24 0304 01/03/24 1036 01/05/24 0256  WBC 4.7 5.2 4.5 6.4 5.2  NEUTROABS  --   --   --  4.1 2.6  HGB 8.3* 8.7* 8.8* 7.9* 7.4*  HCT 26.8* 27.8* 28.1* 26.1* 24.0*  MCV 74.4* 75.1* 75.3* 75.2* 75.0*  PLT 182 194 187 195 188    Basic Metabolic Panel: Recent Labs  Lab 01/01/24 0312 01/02/24 0304 01/03/24 0443 01/04/24 0351 01/05/24 0256  NA 134* 137 133* 133* 135  K 4.5 4.6 5.0 3.8 3.9  CL 105  107 105 103 104  CO2 22 21* 22 23 23   GLUCOSE 133* 133* 267* 119* 123*  BUN 16 16 13 12 16   CREATININE 0.67 0.75 0.62 0.56 0.69  CALCIUM  7.2* 7.9* 8.3* 8.0* 7.8*  MG 2.2 1.9 1.7 1.5* 2.2  PHOS 2.7 2.7 1.5* 2.3* 3.6    GFR: Estimated Creatinine Clearance: 93 mL/min (by C-G formula based on SCr of 0.69 mg/dL).  Liver Function Tests: Recent Labs  Lab 01/02/24 0304 01/03/24 0443 01/04/24 0351 01/05/24 0256  AST 31 26 24  35  ALT 83* 57* 43 36  ALKPHOS 529* 556* 580* 673*  BILITOT 0.6 0.6 0.5 0.7  PROT 6.1* 6.7 6.3* 6.0*  ALBUMIN  2.8* 3.1* 3.0* 2.8*    CBG: Recent Labs  Lab 01/04/24 1940 01/05/24 0029 01/05/24 0354 01/05/24 0733 01/05/24 1208  GLUCAP 124* 119* 134* 126* 132*     No results found for this or any previous visit (from the past 240 hours).       Radiology Studies: DG CHEST PORT 1 VIEW Result Date: 01/03/2024 EXAM: 1 VIEW XRAY OF THE CHEST 01/03/2024 09:48:00 PM COMPARISON: Radiographs Dec 10 2023 CLINICAL HISTORY: Encounter for central line care. PICC line position. FINDINGS: LUNGS AND  PLEURA: The lungs are clear. No pneumothorax. HEART AND MEDIASTINUM: Stable cardiomediastinal silhouette. BONES AND SOFT TISSUES: No acute osseous abnormality. LINES AND TUBES: Right PICC tip in the low SVC. IMPRESSION: 1. Right PICC tip in the low SVC. 2. Clear lungs without pneumothorax. Electronically signed by: Norman Gatlin MD 01/03/2024 09:57 PM EDT RP Workstation: [id number]        Scheduled Meds:  Chlorhexidine  Gluconate Cloth  6 each Topical Daily   fentaNYL    Intravenous Q4H   insulin  aspart  0-9 Units Subcutaneous Q4H   methocarbamol  (ROBAXIN ) injection  500 mg Intravenous Q8H   metoCLOPramide  (REGLAN ) injection  10 mg Intravenous Q6H   pantoprazole  (PROTONIX ) IV  40 mg Intravenous Q12H   Ensure Max Protein  11 oz Oral BID   sodium chloride  flush  10-40 mL Intracatheter Q12H   Vitamin D  (Ergocalciferol )  50,000 Units Oral Q7 days   Continuous Infusions:  TPN ADULT (ION) 90 mL/hr at 01/04/24 1755   TPN ADULT (ION)       LOS: 13 days    Time spent: 40 minutes    Toribio Hummer, MD [facility]   To contact the attending provider between 7A-7P or the covering provider during after hours 7P-7A, please log into the web site [email or web address] and access using universal [place] password for that web site. If you do not have the password, please call the hospital operator.  01/05/2024, 1:18 PM

## 2024-01-05 NOTE — Progress Notes (Signed)
 PCA pump discontinued.  18 mL fentanyl  wasted in stericycle of 4W pyxis 1 with Rosina D,RN.

## 2024-01-06 ENCOUNTER — Inpatient Hospital Stay (HOSPITAL_COMMUNITY): Payer: MEDICAID

## 2024-01-06 DIAGNOSIS — K315 Obstruction of duodenum: Secondary | ICD-10-CM | POA: Diagnosis not present

## 2024-01-06 DIAGNOSIS — B3781 Candidal esophagitis: Secondary | ICD-10-CM | POA: Diagnosis not present

## 2024-01-06 DIAGNOSIS — N179 Acute kidney failure, unspecified: Secondary | ICD-10-CM | POA: Diagnosis not present

## 2024-01-06 DIAGNOSIS — E876 Hypokalemia: Secondary | ICD-10-CM | POA: Diagnosis not present

## 2024-01-06 LAB — CBC
HCT: 24.7 % — ABNORMAL LOW (ref 36.0–46.0)
Hemoglobin: 7.6 g/dL — ABNORMAL LOW (ref 12.0–15.0)
MCH: 23.2 pg — ABNORMAL LOW (ref 26.0–34.0)
MCHC: 30.8 g/dL (ref 30.0–36.0)
MCV: 75.5 fL — ABNORMAL LOW (ref 80.0–100.0)
Platelets: 185 K/uL (ref 150–400)
RBC: 3.27 MIL/uL — ABNORMAL LOW (ref 3.87–5.11)
RDW: 16.1 % — ABNORMAL HIGH (ref 11.5–15.5)
WBC: 4.8 K/uL (ref 4.0–10.5)
nRBC: 0.4 % — ABNORMAL HIGH (ref 0.0–0.2)

## 2024-01-06 LAB — BASIC METABOLIC PANEL WITH GFR
Anion gap: 7 (ref 5–15)
BUN: 19 mg/dL (ref 6–20)
CO2: 23 mmol/L (ref 22–32)
Calcium: 7.8 mg/dL — ABNORMAL LOW (ref 8.9–10.3)
Chloride: 104 mmol/L (ref 98–111)
Creatinine, Ser: 0.57 mg/dL (ref 0.44–1.00)
GFR, Estimated: >60 mL/min (ref >60)
Glucose, Bld: 141 mg/dL — ABNORMAL HIGH (ref 70–99)
Potassium: 3.8 mmol/L (ref 3.5–5.1)
Sodium: 134 mmol/L — ABNORMAL LOW (ref 135–145)

## 2024-01-06 LAB — GLUCOSE, CAPILLARY
Glucose-Capillary: 128 mg/dL — ABNORMAL HIGH (ref 70–99)
Glucose-Capillary: 132 mg/dL — ABNORMAL HIGH (ref 70–99)
Glucose-Capillary: 135 mg/dL — ABNORMAL HIGH (ref 70–99)
Glucose-Capillary: 135 mg/dL — ABNORMAL HIGH (ref 70–99)
Glucose-Capillary: 136 mg/dL — ABNORMAL HIGH (ref 70–99)
Glucose-Capillary: 137 mg/dL — ABNORMAL HIGH (ref 70–99)

## 2024-01-06 LAB — PREPARE RBC (CROSSMATCH)

## 2024-01-06 LAB — RENAL FUNCTION PANEL
Albumin: 2.8 g/dL — ABNORMAL LOW (ref 3.5–5.0)
Anion gap: 8 (ref 5–15)
BUN: 19 mg/dL (ref 6–20)
CO2: 22 mmol/L (ref 22–32)
Calcium: 7.8 mg/dL — ABNORMAL LOW (ref 8.9–10.3)
Chloride: 104 mmol/L (ref 98–111)
Creatinine, Ser: 0.56 mg/dL (ref 0.44–1.00)
GFR, Estimated: >60 mL/min (ref >60)
Glucose, Bld: 140 mg/dL — ABNORMAL HIGH (ref 70–99)
Phosphorus: 3.4 mg/dL (ref 2.5–4.6)
Potassium: 3.8 mmol/L (ref 3.5–5.1)
Sodium: 134 mmol/L — ABNORMAL LOW (ref 135–145)

## 2024-01-06 LAB — PHOSPHORUS: Phosphorus: 3.4 mg/dL (ref 2.5–4.6)

## 2024-01-06 LAB — MAGNESIUM: Magnesium: 2.2 mg/dL (ref 1.7–2.4)

## 2024-01-06 LAB — HEMOGLOBIN AND HEMATOCRIT, BLOOD
HCT: 27.4 % — ABNORMAL LOW (ref 36.0–46.0)
Hemoglobin: 8.7 g/dL — ABNORMAL LOW (ref 12.0–15.0)

## 2024-01-06 MED ORDER — PROSOURCE PLUS PO LIQD
30.0000 mL | Freq: Three times a day (TID) | ORAL | Status: DC
  Administered 2024-01-06 – 2024-01-08 (×2): 30 mL via ORAL
  Filled 2024-01-06 (×3): qty 30

## 2024-01-06 MED ORDER — ACETAMINOPHEN 650 MG RE SUPP
325.0000 mg | Freq: Once | RECTAL | Status: AC
  Administered 2024-01-06: 325 mg via RECTAL
  Filled 2024-01-06: qty 1

## 2024-01-06 MED ORDER — ACETAMINOPHEN 325 MG PO TABS
650.0000 mg | ORAL_TABLET | Freq: Once | ORAL | Status: DC
  Filled 2024-01-06: qty 2

## 2024-01-06 MED ORDER — OXYCODONE HCL 5 MG PO TABS: 5.0000 mg | ORAL_TABLET | Freq: Four times a day (QID) | ORAL | Status: DC | PRN

## 2024-01-06 MED ORDER — BOOST / RESOURCE BREEZE PO LIQD CUSTOM
1.0000 | ORAL | Status: DC
  Administered 2024-01-08: 1 via ORAL

## 2024-01-06 MED ORDER — TRAVASOL 10 % IV SOLN
INTRAVENOUS | Status: AC
  Filled 2024-01-06: qty 1036.8

## 2024-01-06 MED ORDER — SODIUM CHLORIDE 0.9% IV SOLUTION: Freq: Once | INTRAVENOUS | Status: AC

## 2024-01-06 NOTE — Progress Notes (Signed)
 Physical Therapy Treatment Patient Details Name: Chelsea Hansen MRN: [id number] DOB: [birth year] Today's Date: 01/06/2024   History of Present Illness Pt is a 53 y.o. female presenting with nausea and vomiting and admitted for acute kidney injury on 12/22/23.  Pt S/P LAPAROSCOPIC CREATION GASTRJEJONOSTOMY 01/01/24.  Past medical history significant of breast cancer, Duodenal stenosis, esophagitis Constipation, HLD, HTN , anemia, DVT, asthma, SVT.    PT Comments  Pt reports feeling poorly today but agreeable to mobilize.  Pt dizzy with sitting EOB, BP 110/72 mmHg HR 77 bpm.  Dizziness resolved and pt felt able to ambulate in hallway.  Also obtained BP upon returning to room: BP 122/77 mmHg HR 84 bpm.      If plan is discharge home, recommend the following: A little help with bathing/dressing/bathroom;Assistance with cooking/housework;Assist for transportation;Supervision due to cognitive status   Can travel by private vehicle        Equipment Recommendations  None recommended by PT    Recommendations for Other Services       Precautions / Restrictions Precautions Precautions: Fall Recall of Precautions/Restrictions: Intact Precaution/Restrictions Comments: monitor BP     Mobility  Bed Mobility Overal bed mobility: Needs Assistance Bed Mobility: Supine to Sit     Supine to sit: Supervision, HOB elevated     General bed mobility comments: dizziness at EOB; BP 110/72 mmHg HR 77 bpm sitting    Transfers Overall transfer level: Needs assistance   Transfers: Sit to/from Stand Sit to Stand: Contact guard assist           General transfer comment: verbal cues for safety, hand placement    Ambulation/Gait Ambulation/Gait assistance: Contact guard assist Gait Distance (Feet): 120 Feet Assistive device: IV Pole Gait Pattern/deviations: Decreased stride length, Step-through pattern, Trunk flexed Gait velocity: decreased     General Gait Details: pt with both hands pushing  IV pole, pt reports no increased dizziness, just generally not feeling well and fatigued, distance per pt tolerance; upon return to room; BP 122/77 mmHg HR 84 bpm   Stairs             Wheelchair Mobility     Tilt Bed    Modified Rankin (Stroke Patients Only)       Balance             Standing balance-Leahy Scale: Fair                              Hotel manager: No apparent difficulties  Cognition Arousal: Alert Behavior During Therapy: WFL for tasks assessed/performed, Flat affect   PT - Cognitive impairments: No apparent impairments                         Following commands: Intact      Cueing    Exercises      General Comments        Pertinent Vitals/Pain Pain Assessment Pain Assessment: Faces Faces Pain Scale: Hurts little more Pain Location: stomach Pain Descriptors / Indicators: Sore Pain Intervention(s): Repositioned, Heat applied, Monitored during session (pt has k-pad)    Home Living                          Prior Function            PT Goals (current goals can now be found in the care plan section)  Progress towards PT goals: Progressing toward goals    Frequency    Min 3X/week      PT Plan      Co-evaluation              AM-PAC PT 6 Clicks Mobility   Outcome Measure  Help needed turning from your back to your side while in a flat bed without using bedrails?: A Little Help needed moving from lying on your back to sitting on the side of a flat bed without using bedrails?: A Little Help needed moving to and from a bed to a chair (including a wheelchair)?: A Little Help needed standing up from a chair using your arms (e.g., wheelchair or bedside chair)?: A Little Help needed to walk in hospital room?: A Little Help needed climbing 3-5 steps with a railing? : A Little 6 Click Score: 18    End of Session Equipment Utilized During Treatment: Gait  belt Activity Tolerance: Patient tolerated treatment well Patient left: in bed;with call bell/phone within reach Nurse Communication: Mobility status PT Visit Diagnosis: Difficulty in walking, not elsewhere classified (R26.2);Muscle weakness (generalized) (M62.81)     Time: 8641-8583 PT Time Calculation (min) (ACUTE ONLY): 18 min  Charges:    $Gait Training: 8-22 mins PT General Charges $$ ACUTE PT VISIT: 1 Visit                     Tari KLEIN, DPT Physical Therapist Acute Rehabilitation Services Office: [phone]    Tari CROME [place] 01/06/2024, 4:27 PM

## 2024-01-06 NOTE — Progress Notes (Signed)
 PHARMACY - TOTAL PARENTERAL NUTRITION CONSULT NOTE   Indication: Pancreatic mass with duodenal stricture/obstruction  Patient Measurements: Height: 5' 11 (180.3 cm) Weight: 81.7 kg (180 lb 1.9 oz) IBW/kg (Calculated) : 70.8 TPN AdjBW (KG): 69.6 Body mass index is 25.12 kg/m. Usual Weight: 92.5 kg in April of this year  Assessment:  Pharmacy consulted to manage TPN for this 53 yo needing nutritional support while awaiting surgical intervention for pancreatic mass with duodenal stricture/obstruction in the setting of metastatic breast cancer resulting in poor oral intake, persistent nausea and vomiting, and weight loss.  Glucose / Insulin :  no Hx DM. BG goal <180 - CBGs improved to 125-139 in last 24 hrs - 7 units of insulin  used in the last 24 hrs Electrolytes: Na slightly low, CoCa 8.5 (slightly low, maxed in TPN), all other lytes WNL Renal: SCr and BUN WNL  Hepatic: albumin  low at 2.8, alk phos elevated and rising 673, LFTs WNL, Total Bilirubin WNL, triglycerides elevated at 261 but stable since last checked Intake / Output; MIVF:  PO: 230 Output: Urine 1400 mL, Emesis832ml (up) LBM: 7/11 GI Imaging: 7/4 MR abdomen MRCP: pancreatic mass, vertebral metastases, pancolonic diverticulosis 7/10 CT: pancreatic head equivocal fullness, small amount free pelvic fluid, widespread osseous metastatic disease, small hiatal hernia, colonic diverticulosis GI Surgeries / Procedures:  7/8 EGD 7/7 EUS, upper GI tract 7/14: bypass surgery for creation of G-Junostomy  Central access: PICC placed 7/9 TPN start date: 7/9  Nutritional Goals:  Goal TPN rate is 90 mL/hr (provides 104 g of protein and 2238 kcals per day)  RD Assessment: Estimated Needs Total Energy Estimated Needs: 2000-2300kcal/day Total Protein Estimated Needs: 100-115g/day Total Fluid Estimated Needs: 2.1-2.4L/day  Current Nutrition:  -Advanced to FLD 7/15 -TPN - Ensure max: refusing  Plan: Continue TPN at goal rate  of 90 mL/hr at 1800 Electrolytes in TPN:  Na 130 mEq/L K 20 mEq/L Ca 10 mEq/L Mg 8 mEq/L Phos 25 mmol/L Cl:Ac 1:1 Add standard MVI and trace elements to TPN Continue sensitive q4h SSI and adjust as needed  Monitor TPN labs on Mon/Thurs, and PRN D/c TPN when tolerating po intake   Khiry Pasquariello Karoline Marina, PharmD, BCPS Clinical Staff Pharmacist 7/18/20259:33 AM

## 2024-01-06 NOTE — Progress Notes (Signed)
 PROGRESS NOTE    Chelsea Hansen  FMW:[id number] DOB: 1971/03/24 DOA: 12/22/2023 PCP: [name], NP   Chief Complaint  Patient presents with   Abdominal Pain   Emesis    Brief Narrative:  53 y.o. 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   Imaging showed a duodenal obstruction and masslike area of the pancreas.  GI and General Surgery consulted.     EUS attempted on 7/7, but unsuccessful secondary to duodenal stenosis.     Repeat EGD/EUS on 7/9 again unsuccessful in biopsy.  Gen surg consulted and NG tube was placed, patient started on TPN, patient subsequently underwent laparoscopic creation of gastrojejunostomy for duodenal stricture.  Patient started on clear liquids and diet being advanced per general surgery.      Assessment & Plan:   Principal Problem:   AKI (acute kidney injury) (HCC) Active Problems:   Hypokalemia   Acute kidney injury superimposed on chronic kidney disease (HCC)   Hypocalcemia   Breast cancer metastasized to bone, right (HCC)   HLD (hyperlipidemia)   Constipation   Dehydration   Prolonged QT interval   Acute esophagitis   Candida esophagitis (HCC)   Transaminitis   Pancreatic mass   Right sided weakness   Hypotension   Abnormal CT of the abdomen   Elevated liver enzymes   Acute kidney injury (HCC)   Abnormal MRI of abdomen   Pancreatic lesion   Unintentional weight loss   Nausea and vomiting   Duodenal obstruction   Hypophosphatemia   Hypomagnesemia  #1 pancreatic mass with duodenal stricture/SBO in the setting of pancreatic mass -Patient noted to have a new mass noted on imaging of the pancreas noted on CT abdomen and pelvis. - Patient underwent MRCP which was concerning for expansive, masslike appearance of the pancreatic head and uncinate as noted on prior CT measuring 2.5 x 2.3 cm which was noted to be new and  interval to prior restaging PET/CT dated 06/10/2023 and highly concerning for pancreatic adenocarcinoma. -CA 19-9 noted at 465. - Patient seen in consultation by GI however unfortunately EUS which was attempted on 7 7 was unsuccessful secondary to duodenal stenosis with repeat EGD/EUS done on 7 /9 noted to be unsuccessful and unable to get a biopsy. - General Surgery was consulted and patient underwent a gastrojejunal bypass surgery on 7/14 with no concerning nodules or evidence of metastatic disease on diagnostic laparoscopy. - Patient currently on TPN with diet advancement per general surgery. - Patient noted to have tolerated full liquid diet on 01/03/2024 however around 8 PM noted to had episode of emesis. -Patient noted with emesis the morning of 01/05/2024 and again this morning 01/06/2024.   -Continue scheduled Reglan  10 mg every 6 hours. -Continue IV PPI. - continue full liquid diet, Ensure, TPN as per general surgery. - Discontinued fentanyl  PCA pump and placed on IV Dilaudid  as needed severe pain.  -Also add as needed oxycodone . -Per general surgery patient for upper GI today, which was done this morning to check gastric emptying via bypass as patient still with bouts of emesis. - General Surgery following and appreciate the input and recommendations.  2.  Hypertension -BP soft/borderline.   - Patient asymptomatic.   - Continue to hold home antihypertensive medications.   3.  Moderate protein calorie malnutrition - On TPN.   - On full liquids.    4.  Candida esophagitis -  Status post full course fluconazole .   5.  Chronic asthma -Stable.  6.  AKI on CKD stage 3a - Patient noted to have a creatinine of 3.2 on admission which improved with hydration and resolved, creatinine currently at 0.57  7.  Hypophosphatemia/hypomagnesemia -Being repleted per pharmacy as patient on TPN.  8.  Chronic anemia -Patient with no overt bleeding. - Hemoglobin at 7.6. - Patient with  soft/borderline blood pressure. - Transfuse 1 unit PRBCs.   DVT prophylaxis: Lovenox  Code Status: Full Family Communication: Updated patient. Disposition: TBD  Status is: Inpatient Remains inpatient appropriate because: Severity of illness   Consultants:  Gastroenterology: Dr. Albertus 12/23/2023 General Surgery: Dr. Vernetta 12/27/2023 Dr. Odean   Procedures:  CT abdomen and pelvis 12/29/2023 Abdominal films 12/24/2023, 12/28/2023, 12/30/2023 Chest x-ray 01/03/2024 PICC line placement MRCP 12/23/2023 Right upper quadrant ultrasound 12/22/2023 EGD/EUS 12/26/2023 by Dr. Wilhelmenia Laparoscopic creation gastrojejunostomy by general surgery: Dr. Lyndel 01/02/2024  Antimicrobials:  Anti-infectives (From admission, onward)    Start     Dose/Rate Route Frequency Ordered Stop   01/02/24 1245  ceFAZolin  (ANCEF ) 2-4 GM/100ML-% IVPB       Note to Pharmacy: Kathern Quant: cabinet override      01/02/24 1245 01/03/24 0059   12/22/23 2230  fluconazole  (DIFLUCAN ) IVPB 200 mg  Status:  Discontinued        200 mg 100 mL/hr over 60 Minutes Intravenous Every 24 hours 12/22/23 2132 01/03/24 1126         Subjective: Patient just returned from upper GI series.  Stated had a bout of emesis bilious in nature at 6 AM this morning.  Denies any chest pain or shortness of breath.  Still with some abdominal pain/discomfort.  IV pain medications helping.  Patient states  I just want to stop throwing up .   Objective: Vitals:   01/05/24 2121 01/06/24 0423 01/06/24 0500 01/06/24 0916  BP: 138/88 (!) 92/58  103/68  Pulse: 76 89    Resp:  18    Temp:  98.4 F (36.9 C)    TempSrc:  Oral    SpO2:  97%    Weight:   81.7 kg   Height:        Intake/Output Summary (Last 24 hours) at 01/06/2024 1251 Last data filed at 01/06/2024 0400 Gross per 24 hour  Intake 1568.65 ml  Output 1400 ml  Net 168.65 ml   Filed Weights   01/04/24 0500 01/05/24 0832 01/06/24 0500  Weight: 80.9 kg 81.2 kg 81.7 kg     Examination:  General exam: NAD. Respiratory system: Lungs clear to auscultation bilaterally.  No wheezes, no crackles, no rhonchi.  Fair air movement.  Speaking in full sentences.   Cardiovascular system: RRR no murmurs rubs or gallops.  No JVD.  No pitting lower extremity edema.  Gastrointestinal system: Abdomen is soft, nondistended, some tenderness to palpation around incision sites and right lower quadrant.  Hypoactive bowel sounds.  No rebound.  No guarding.   Central nervous system: Alert and oriented. No focal neurological deficits. Extremities: Symmetric 5 x 5 power. Skin: No rashes, lesions or ulcers Psychiatry: Judgement and insight appear normal. Mood & affect appropriate.     Data Reviewed: I have personally reviewed following labs and imaging studies  CBC: Recent Labs  Lab 01/01/24 0312 01/02/24 0304 01/03/24 1036 01/05/24 0256 01/06/24 0416  WBC 5.2 4.5 6.4 5.2 4.8  NEUTROABS  --   --  4.1 2.6  --   HGB 8.7* 8.8* 7.9*  7.4* 7.6*  HCT 27.8* 28.1* 26.1* 24.0* 24.7*  MCV 75.1* 75.3* 75.2* 75.0* 75.5*  PLT 194 187 195 188 185    Basic Metabolic Panel: Recent Labs  Lab 01/02/24 0304 01/03/24 0443 01/04/24 0351 01/05/24 0256 01/06/24 0416  NA 137 133* 133* 135 134*  134*  K 4.6 5.0 3.8 3.9 3.8  3.8  CL 107 105 103 104 104  104  CO2 21* 22 23 23 23  22   GLUCOSE 133* 267* 119* 123* 141*  140*  BUN 16 13 12 16 19  19   CREATININE 0.75 0.62 0.56 0.69 0.57  0.56  CALCIUM  7.9* 8.3* 8.0* 7.8* 7.8*  7.8*  MG 1.9 1.7 1.5* 2.2 2.2  PHOS 2.7 1.5* 2.3* 3.6 3.4  3.4    GFR: Estimated Creatinine Clearance: 93 mL/min (by C-G formula based on SCr of 0.57 mg/dL).  Liver Function Tests: Recent Labs  Lab 01/02/24 0304 01/03/24 0443 01/04/24 0351 01/05/24 0256 01/06/24 0416  AST 31 26 24  35  --   ALT 83* 57* 43 36  --   ALKPHOS 529* 556* 580* 673*  --   BILITOT 0.6 0.6 0.5 0.7  --   PROT 6.1* 6.7 6.3* 6.0*  --   ALBUMIN  2.8* 3.1* 3.0* 2.8* 2.8*     CBG: Recent Labs  Lab 01/05/24 1602 01/05/24 2025 01/06/24 0009 01/06/24 0422 01/06/24 0804  GLUCAP 125* 139* 135* 137* 136*     No results found for this or any previous visit (from the past 240 hours).       Radiology Studies: No results found.       Scheduled Meds:  (feeding supplement) PROSource Plus  30 mL Oral TID BM   sodium chloride    Intravenous Once   acetaminophen   650 mg Oral Once   Chlorhexidine  Gluconate Cloth  6 each Topical Daily   [START ON 01/07/2024] feeding supplement  1 Container Oral Q24H   insulin  aspart  0-9 Units Subcutaneous Q4H   methocarbamol  (ROBAXIN ) injection  500 mg Intravenous Q8H   metoCLOPramide  (REGLAN ) injection  10 mg Intravenous Q6H   pantoprazole  (PROTONIX ) IV  40 mg Intravenous Q12H   sodium chloride  flush  10-40 mL Intracatheter Q12H   Vitamin D  (Ergocalciferol )  50,000 Units Oral Q7 days   Continuous Infusions:  TPN ADULT (ION) 90 mL/hr at 01/05/24 1753   TPN ADULT (ION)       LOS: 14 days    Time spent: 40 minutes    Toribio Hummer, MD [facility]   To contact the attending provider between 7A-7P or the covering provider during after hours 7P-7A, please log into the web site [email or web address] and access using universal [place] password for that web site. If you do not have the password, please call the hospital operator.  01/06/2024, 12:51 PM

## 2024-01-06 NOTE — Progress Notes (Signed)
 Nutrition Follow-up  DOCUMENTATION CODES:   Not applicable  INTERVENTION:  - Plan to continue goal TPN at this time.              - TPN management per Pharmacy   - Full Liquid diet per Surgery.  - Discontinue Ensure Max, patient does not like and has not been consuming.  - Trial Boost Breeze once daily, provides 250 kcal and 9 grams of protein.  - Encouraged patient to consume slowly. - ProSource Plus po TID, each supplement provides 100 kcal and 15 grams of protein   - Continue Vitamin D  supplementation.  - Patient receiving multivitamin in her TPN.   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 *ongoing  GOAL:   Patient will meet greater than or equal to 90% of their needs *met with TPN  MONITOR:   PO intake, Supplement acceptance, Diet advancement, Labs, Weight trends  REASON FOR ASSESSMENT:   Consult New TPN/TNA  ASSESSMENT:   53 y.o. 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  7/3: admitted, clear liquids 7/5: FLD 7/6: CLD 7/7: NPO, s/p EUS 7/8: CLD, s/p EGD 7/9: TPN initiated 7/10: NPO 7/11: NGT placed for LIS 7/12: TPN advanced to goal 7/14: s/p gastrojejonostomy 7/15: FLD   Patient continues to experience nausea and vomiting. She is documented to be consuming 25-100% of meals, but question if absorbing anything with  vomiting. She does not like the Ensure Max. Requesting to receive Boost Breeze again. Discussed will trial 1 and assess tolerance, encouraged patient to sip slowly during the day. Also agreeable to try ProSource Plus for another option.   She remains on goal TPN at 75mL/hr, providing 2238 kcals and 104g protein.     Admission weight: 169 lbs Current weight: 180 lbs I&O's: +6.5L since admit   Medications: Reglan , Protonix , 50,000 units Vitamin D  weekly   Labs reviewed: Na 134 Triglycerides 261 (as of 7/14)   Diet Order:   Diet Order             Diet full liquid Room service appropriate? Yes; Fluid consistency: Thin  Diet effective now                   EDUCATION NEEDS:  Education needs have been addressed  Skin:  Skin Assessment: Reviewed RN Assessment  Last BM:  7/11  Height:  Ht Readings from Last 1 Encounters:  12/27/23 5' 11 (1.803 m)   Weight:  Wt Readings from Last 1 Encounters:  01/06/24 81.7 kg   Ideal Body Weight:  70 kg  BMI:  Body mass index is 25.12 kg/m.  Estimated Nutritional Needs:  Kcal:  2000-2300kcal/day Protein:  100-115g/day Fluid:  2.1-2.4L/day    Trude Ned RD, LDN Contact via Secure Chat.

## 2024-01-06 NOTE — Progress Notes (Addendum)
 4 Days Post-Op   Subjective/Chief Complaint: Patient endorses nausea and emesis and is passing flatus. Still denies having BM. She is questioning when she will be discharged. She rates her pain at 7/10 this AM.    Objective: Vital signs in last 24 hours: Temp:  [98.2 F (36.8 C)-98.6 F (37 C)] 98.4 F (36.9 C) (07/18 0423) Pulse Rate:  [73-103] 89 (07/18 0423) Resp:  [16-18] 18 (07/18 0423) BP: (86-138)/(53-88) 92/58 (07/18 0423) SpO2:  [97 %-100 %] 97 % (07/18 0423) FiO2 (%):  [28 %] 28 % (07/17 1236) Weight:  [81.2 kg-81.7 kg] 81.7 kg (07/18 0500) Last BM Date : 12/30/23  Intake/Output from previous day: 07/17 0701 - 07/18 0700 In: 1748.7 [P.O.:230; I.V.:1518.7] Out: 2200 [Urine:1400; Emesis/NG output:800] Intake/Output this shift: No intake/output data recorded.  Abdomen soft and minimally tender, incisions clean dry intact with glue  Lab Results:  Recent Labs    01/05/24 0256 01/06/24 0416  WBC 5.2 4.8  HGB 7.4* 7.6*  HCT 24.0* 24.7*  PLT 188 185   BMET Recent Labs    01/05/24 0256 01/06/24 0416  NA 135 134*  134*  K 3.9 3.8  3.8  CL 104 104  104  CO2 23 23  22   GLUCOSE 123* 141*  140*  BUN 16 19  19   CREATININE 0.69 0.57  0.56  CALCIUM  7.8* 7.8*  7.8*   PT/INR No results for input(s): LABPROT, INR in the last 72 hours. ABG No results for input(s): PHART, HCO3 in the last 72 hours.  Invalid input(s): PCO2, PO2  Studies/Results: No results found.    Anti-infectives: Anti-infectives (From admission, onward)    Start     Dose/Rate Route Frequency Ordered Stop   01/02/24 1245  ceFAZolin  (ANCEF ) 2-4 GM/100ML-% IVPB       Note to Pharmacy: Kathern Quant: cabinet override      01/02/24 1245 01/03/24 0059   12/22/23 2230  fluconazole  (DIFLUCAN ) IVPB 200 mg  Status:  Discontinued        200 mg 100 mL/hr over 60 Minutes Intravenous Every 24 hours 12/22/23 2132 01/03/24 1126       Assessment/Plan: POD 4- S/P  LAPAROSCOPIC CREATION GASTRJEJONOSTOMY, 7/14, Dr. Lyndel   -Continue TPN -Continue full liquid diet as tolerated -Reglan  -UGI scheduled for today (7/18) -When her emesis episodes resolve, she will likely need calorie counts to ensure she is adequately nourished prior to leaving the hospital.  She may go home on full liquid diet with supplements while she heals from this anastomosis.  -CA 19-9 is 465   FEN -full liquid diet, Ensure, TPN VTE - ok for chemical prophylaxis, SCDs HTN HLD protein calorie nutrition - PICC, TPN   I reviewed hospitalist notes, last 24 h vitals and pain scores, last 48 h intake and output, last 24 h labs and trends, and last 24 h imaging results.   Eulah Hammonds, PA-C 01/06/2024

## 2024-01-06 NOTE — Progress Notes (Signed)
 Pt vomited 800 mL green emesis.  Chelsea Hansen

## 2024-01-06 NOTE — Progress Notes (Signed)
 HEMATOLOGY-ONCOLOGY PROGRESS NOTE  SUBJECTIVE: Persistent nausea although improving gradually.  Pain issues currently on pain pump. Nutrition through TPN.    OBJECTIVE: REVIEW OF SYSTEMS:     All other systems were reviewed with the patient and are negative.      PHYSICAL EXAMINATION: ECOG PERFORMANCE STATUS: 3 - Symptomatic, >50% confined to bed  Vitals:   01/06/24 0423 01/06/24 0916  BP: (!) 92/58 103/68  Pulse: 89   Resp: 18   Temp: 98.4 F (36.9 C)   SpO2: 97%    Filed Weights   01/04/24 0500 01/05/24 0832 01/06/24 0500  Weight: 178 lb 6.4 oz (80.9 kg) 179 lb (81.2 kg) 180 lb 1.9 oz (81.7 kg)       LABORATORY DATA:  I have reviewed the data as listed    Latest Ref Rng & Units 01/06/2024    4:16 AM 01/05/2024    2:56 AM 01/04/2024    3:51 AM  CMP  Glucose 70 - 99 mg/dL 70 - 99 mg/dL 858    859  876  880   BUN 6 - 20 mg/dL 6 - 20 mg/dL 19    19  16  12    Creatinine 0.44 - 1.00 mg/dL 9.55 - 8.99 mg/dL 9.42    9.43  9.30  9.43   Sodium 135 - 145 mmol/L 135 - 145 mmol/L 134    134  135  133   Potassium 3.5 - 5.1 mmol/L 3.5 - 5.1 mmol/L 3.8    3.8  3.9  3.8   Chloride 98 - 111 mmol/L 98 - 111 mmol/L 104    104  104  103   CO2 22 - 32 mmol/L 22 - 32 mmol/L 23    22  23  23    Calcium  8.9 - 10.3 mg/dL 8.9 - 89.6 mg/dL 7.8    7.8  7.8  8.0   Total Protein 6.5 - 8.1 g/dL  6.0  6.3   Total Bilirubin 0.0 - 1.2 mg/dL  0.7  0.5   Alkaline Phos 38 - 126 U/L  673  580   AST 15 - 41 U/L  35  24   ALT 0 - 44 U/L  36  43     Lab Results  Component Value Date   WBC 4.8 01/06/2024   HGB 7.6 (L) 01/06/2024   HCT 24.7 (L) 01/06/2024   MCV 75.5 (L) 01/06/2024   PLT 185 01/06/2024   NEUTROABS 2.6 01/05/2024    ASSESSMENT AND PLAN: 1.  GI obstruction: Laparoscopic gastrojejunostomy 2.  Intractable nausea and vomiting: She is able to keep liquids and some food down. 3.  Pain issues: Please transition to oral pain medication. 4.  Pancreatic mass: After  discussion with surgery the best approach for her would be to wait 8 weeks and undergo EGD EUS for further evaluation. 5.  Metastatic breast cancer: Verzinio on hold.  She can continue with aromatase inhibitor therapy with letrozole .  Discharge planning: I discussed with her that TPN can be arranged for home.  Home health will need to assist with that. Once her pain medications are shifted to oral regimen, she should be able to go home in the next 1 to 2 days with follow-up with GI for repeat endoscopy and EUS.  Regarding the pancreatic mass no oncological discussion can be done until we have a tissue diagnosis.

## 2024-01-07 DIAGNOSIS — K315 Obstruction of duodenum: Secondary | ICD-10-CM | POA: Diagnosis not present

## 2024-01-07 DIAGNOSIS — B3781 Candidal esophagitis: Secondary | ICD-10-CM | POA: Diagnosis not present

## 2024-01-07 DIAGNOSIS — N179 Acute kidney failure, unspecified: Secondary | ICD-10-CM | POA: Diagnosis not present

## 2024-01-07 DIAGNOSIS — E876 Hypokalemia: Secondary | ICD-10-CM | POA: Diagnosis not present

## 2024-01-07 LAB — CBC
HCT: 25.5 % — ABNORMAL LOW (ref 36.0–46.0)
HCT: 27.1 % — ABNORMAL LOW (ref 36.0–46.0)
Hemoglobin: 8 g/dL — ABNORMAL LOW (ref 12.0–15.0)
Hemoglobin: 8.3 g/dL — ABNORMAL LOW (ref 12.0–15.0)
MCH: 23.9 pg — ABNORMAL LOW (ref 26.0–34.0)
MCH: 23.5 pg — ABNORMAL LOW (ref 26.0–34.0)
MCHC: 31.4 g/dL (ref 30.0–36.0)
MCHC: 30.6 g/dL (ref 30.0–36.0)
MCV: 76.1 fL — ABNORMAL LOW (ref 80.0–100.0)
MCV: 76.8 fL — ABNORMAL LOW (ref 80.0–100.0)
Platelets: 153 K/uL (ref 150–400)
Platelets: 181 K/uL (ref 150–400)
RBC: 3.35 MIL/uL — ABNORMAL LOW (ref 3.87–5.11)
RBC: 3.53 MIL/uL — ABNORMAL LOW (ref 3.87–5.11)
RDW: 16.8 % — ABNORMAL HIGH (ref 11.5–15.5)
RDW: 16.9 % — ABNORMAL HIGH (ref 11.5–15.5)
WBC: 4.2 K/uL (ref 4.0–10.5)
WBC: 4.8 K/uL (ref 4.0–10.5)
nRBC: 0.5 % — ABNORMAL HIGH (ref 0.0–0.2)
nRBC: 0.6 % — ABNORMAL HIGH (ref 0.0–0.2)

## 2024-01-07 LAB — BASIC METABOLIC PANEL WITH GFR
Anion gap: 7 (ref 5–15)
Anion gap: 5 (ref 5–15)
BUN: 18 mg/dL (ref 6–20)
BUN: 15 mg/dL (ref 6–20)
CO2: 22 mmol/L (ref 22–32)
CO2: 22 mmol/L (ref 22–32)
Calcium: 7.7 mg/dL — ABNORMAL LOW (ref 8.9–10.3)
Calcium: 7.8 mg/dL — ABNORMAL LOW (ref 8.9–10.3)
Chloride: 105 mmol/L (ref 98–111)
Chloride: 109 mmol/L (ref 98–111)
Creatinine, Ser: 0.47 mg/dL (ref 0.44–1.00)
Creatinine, Ser: 0.46 mg/dL (ref 0.44–1.00)
GFR, Estimated: >60 mL/min (ref >60)
GFR, Estimated: >60 mL/min (ref >60)
Glucose, Bld: 127 mg/dL — ABNORMAL HIGH (ref 70–99)
Glucose, Bld: 115 mg/dL — ABNORMAL HIGH (ref 70–99)
Potassium: 3.4 mmol/L — ABNORMAL LOW (ref 3.5–5.1)
Potassium: 4.1 mmol/L (ref 3.5–5.1)
Sodium: 134 mmol/L — ABNORMAL LOW (ref 135–145)
Sodium: 136 mmol/L (ref 135–145)

## 2024-01-07 LAB — MAGNESIUM: Magnesium: 2.1 mg/dL (ref 1.7–2.4)

## 2024-01-07 LAB — GLUCOSE, CAPILLARY
Glucose-Capillary: 116 mg/dL — ABNORMAL HIGH (ref 70–99)
Glucose-Capillary: 122 mg/dL — ABNORMAL HIGH (ref 70–99)
Glucose-Capillary: 126 mg/dL — ABNORMAL HIGH (ref 70–99)
Glucose-Capillary: 127 mg/dL — ABNORMAL HIGH (ref 70–99)
Glucose-Capillary: 130 mg/dL — ABNORMAL HIGH (ref 70–99)
Glucose-Capillary: 131 mg/dL — ABNORMAL HIGH (ref 70–99)

## 2024-01-07 MED ORDER — SODIUM CHLORIDE 0.9 % IV SOLN: INTRAVENOUS | Status: AC

## 2024-01-07 MED ORDER — KETOROLAC TROMETHAMINE 30 MG/ML IJ SOLN
30.0000 mg | Freq: Four times a day (QID) | INTRAMUSCULAR | Status: AC | PRN
  Administered 2024-01-07 – 2024-01-12 (×10): 30 mg via INTRAVENOUS
  Filled 2024-01-07 (×10): qty 1

## 2024-01-07 MED ORDER — MORPHINE SULFATE (PF) 2 MG/ML IV SOLN: 1.0000 mg | INTRAVENOUS | Status: DC | PRN

## 2024-01-07 MED ORDER — LORAZEPAM 2 MG/ML IJ SOLN
1.0000 mg | INTRAMUSCULAR | Status: DC | PRN
  Administered 2024-01-08: 2 mg via INTRAVENOUS
  Filled 2024-01-07: qty 1

## 2024-01-07 MED ORDER — TRAVASOL 10 % IV SOLN
INTRAVENOUS | Status: AC
  Filled 2024-01-07: qty 1036.8

## 2024-01-07 MED ORDER — BISACODYL 10 MG RE SUPP
10.0000 mg | Freq: Once | RECTAL | Status: AC
  Administered 2024-01-07: 10 mg via RECTAL
  Filled 2024-01-07: qty 1

## 2024-01-07 MED ORDER — POTASSIUM CHLORIDE 10 MEQ/100ML IV SOLN
10.0000 meq | INTRAVENOUS | Status: AC
  Administered 2024-01-07 (×5): 10 meq via INTRAVENOUS
  Filled 2024-01-07 (×5): qty 100

## 2024-01-07 MED ORDER — SODIUM CHLORIDE 0.9 % IV BOLUS
1000.0000 mL | Freq: Once | INTRAVENOUS | Status: AC
  Administered 2024-01-07: 1000 mL via INTRAVENOUS

## 2024-01-07 NOTE — Progress Notes (Signed)
 PHARMACY - TOTAL PARENTERAL NUTRITION CONSULT NOTE   Indication: Pancreatic mass with duodenal stricture/obstruction  Patient Measurements: Height: 5' 11 (180.3 cm) Weight: 81.7 kg (180 lb 1.9 oz) IBW/kg (Calculated) : 70.8 TPN AdjBW (KG): 69.6 Body mass index is 25.12 kg/m. Usual Weight: 92.5 kg in April of this year  Assessment:  Pharmacy consulted to manage TPN for this 53 yo needing nutritional support while awaiting surgical intervention for pancreatic mass with duodenal stricture/obstruction in the setting of metastatic breast cancer resulting in poor oral intake, persistent nausea and vomiting, and weight loss.  Glucose / Insulin :  no Hx DM. BG goal <180 - CBGs all < 150 - 1 unit of insulin  used in the last 24 hrs Electrolytes: Na remains slightly low, K 3.4 - low; CoCa 8.66 (slightly low, maxed in TPN), all other lytes WNL Renal: SCr and BUN WNL  Hepatic: albumin  low at 2.8,  On 7/17: alk phos elevated and rising 673, LFTs WNL, Total Bilirubin WNL, triglycerides elevated at 261 (7/14) but stable since last checked Intake / Output; MIVF:  PO: 50 ml Output: Urine 400 mL, Emesis 800 ml LBM: 7/11 GI Imaging: 7/4 MR abdomen MRCP: pancreatic mass, vertebral metastases, pancolonic diverticulosis 7/10 CT: pancreatic head equivocal fullness, small amount free pelvic fluid, widespread osseous metastatic disease, small hiatal hernia, colonic diverticulosis GI Surgeries / Procedures:  7/8 EGD 7/7 EUS, upper GI tract 7/14: bypass surgery for creation of G-Junostomy  Central access: PICC placed 7/9 TPN start date: 7/9  Nutritional Goals:  Goal TPN rate is 90 mL/hr (provides 104 g of protein and 2238 kcals per day)  RD Assessment: Estimated Needs Total Energy Estimated Needs: 2000-2300kcal/day Total Protein Estimated Needs: 100-115g/day Total Fluid Estimated Needs: 2.1-2.4L/day  Current Nutrition:  -Advanced to FLD 7/15 - not tolerating w/ nausea & vomiting -TPN - Ensure  max: DC'd b/c pt refused Boost Breeze daily - to start 7/19 Prosource Plus TID  Plan: Now: 5 runs KCL ordered by TRH Continue TPN at goal rate of 90 mL/hr at 1800 Electrolytes in TPN:  Na 150 mEq/L (max) K 20 mEq/L Ca 10 mEq/L (max)  Mg 8 mEq/L Phos 25 mmol/L Cl:Ac 1:1 Add standard MVI and trace elements to TPN DC SSI & DC CBGs  Monitor TPN labs on Mon/Thurs, and PRN, BMET in am ? Transition to cyclic rate   Rosaline IVAR Edison, Pharm.D Use secure chat for questions 01/07/2024 10:08 AM

## 2024-01-07 NOTE — Progress Notes (Signed)
 Pt attempted to eat some strained soup, immediatly after eating pt complained of stomach pain with nausea. PRN 1mg  of IV diaudid given in addition to 10mg  of IV compazine . About after pushing meds, pt stated she felt funny and needed to lay back. Pt laid her head on the pillow and her eyes rolled to the back of her head. During this time, pt was not responsive to voice. At this time nurse called for CN and rapid RN. After a brief moment, pt began to respond but continued to state that she felt weird. Vital signs: T 99.4, BP 110/66 (MAP 80), HR 75, O2 98 on RA.

## 2024-01-07 NOTE — Progress Notes (Signed)
 Pt vomited 800 ml green emesis.

## 2024-01-07 NOTE — Progress Notes (Signed)
 5 Days Post-Op   Subjective/Chief Complaint: The patient vomits every 12 hours.  Gastrojejunostomy still does not seem to be draining well.   Objective: Vital signs in last 24 hours: Temp:  [98.3 F (36.8 C)-99.5 F (37.5 C)] 98.3 F (36.8 C) (07/19 0430) Pulse Rate:  [76-88] 76 (07/19 0430) Resp:  [15-20] 16 (07/19 0430) BP: (91-113)/(52-72) 113/72 (07/19 0805) SpO2:  [97 %-100 %] 97 % (07/19 0430) Last BM Date : 12/30/23  Intake/Output from previous day: 07/18 0701 - 07/19 0700 In: 3176.9 [P.O.:50; I.V.:2796.9; Blood:330] Out: 1200 [Urine:400; Emesis/NG output:800] Intake/Output this shift: No intake/output data recorded.  Abdomen soft and minimally tender, incisions clean dry intact with glue  Lab Results:  Recent Labs    01/06/24 0416 01/06/24 1959 01/07/24 0358  WBC 4.8  --  4.2  HGB 7.6* 8.7* 8.0*  HCT 24.7* 27.4* 25.5*  PLT 185  --  153   BMET Recent Labs    01/06/24 0416 01/07/24 0358  NA 134*  134* 134*  K 3.8  3.8 3.4*  CL 104  104 105  CO2 23  22 22   GLUCOSE 141*  140* 127*  BUN 19  19 18   CREATININE 0.57  0.56 0.47  CALCIUM  7.8*  7.8* 7.7*   PT/INR No results for input(s): LABPROT, INR in the last 72 hours. ABG No results for input(s): PHART, HCO3 in the last 72 hours.  Invalid input(s): PCO2, PO2  Studies/Results: DG UGI W SINGLE CM (SOL OR THIN BA) Result Date: 01/06/2024 CLINICAL DATA:  53 year old female with a history of metastatic breast cancer who presented to the ED with several days of nausea and vomiting. Imaging revealed duodenal obstruction from mass-like area of the pancreas. Patient is currently 4 days s/p laparoscopic creation of gastrojejunostomy with continued nausea and vomiting. Request for UGI. EXAM: DG UGI W SINGLE CM TECHNIQUE: Single contrast examination was then performed using thin liquid barium. This exam was performed by [facility] PA-C, and was supervised and interpreted by Dr. Newell Eke. FLUOROSCOPY: Radiation Exposure Index (as provided by the fluoroscopic device): 33.9 mGy Kerma COMPARISON:  CT A/P W CONTRAST - 12/29/23. FINDINGS: Esophagus:  Grossly normal. Esophageal motility:  Within normal limits. Gastroesophageal reflux:  None visualized. Stomach: Gastrojejunostomy without leak. Overall poor opacification of the stomach with further assessment limited by limited contrast ingestion. Gastric emptying: Not assessed. Duodenum:  Surgically absent.  Gastrojejunostomy. Other:  None. IMPRESSION: Expected postoperative changes after gastrojejunostomy. No evidence of contrast leak or extravasation. Electronically Signed   By: Newell Eke M.D.   On: 01/06/2024 13:09      Anti-infectives: Anti-infectives (From admission, onward)    Start     Dose/Rate Route Frequency Ordered Stop   01/02/24 1245  ceFAZolin  (ANCEF ) 2-4 GM/100ML-% IVPB       Note to Pharmacy: Kathern Quant: cabinet override      01/02/24 1245 01/03/24 0059   12/22/23 2230  fluconazole  (DIFLUCAN ) IVPB 200 mg  Status:  Discontinued        200 mg 100 mL/hr over 60 Minutes Intravenous Every 24 hours 12/22/23 2132 01/03/24 1126       Assessment/Plan: POD 5- S/P LAPAROSCOPIC CREATION GASTRJEJONOSTOMY, 7/14, Dr. Lyndel   -Continue TPN -Continue full liquid diet as tolerated -Reglan  -UGI 7/18 without complication -When her emesis episodes resolve, she will likely need calorie counts to ensure she is adequately nourished prior to leaving the hospital.  She may go home on full liquid diet with supplements while  she heals from this anastomosis.  -We discussed delayed gastric emptying and options for treatment.  I offered a gastrojejunostomy tube for drainage of the stomach and downstream tube feeds however she is not interested in having a feeding tube placed.  She is interested in going home on TPN, however does not feel she can leave with the amount of vomiting she has been having.  Unfortunately,  delayed gastric emptying is a common issue with this type of anastomosis and is difficult to know when her stomach will start draining better.  -CA 19-9 is 465   FEN -full liquid diet, Ensure, TPN VTE - ok for chemical prophylaxis, SCDs HTN HLD protein calorie nutrition - PICC, TPN   I reviewed hospitalist notes, last 24 h vitals and pain scores, last 48 h intake and output, last 24 h labs and trends, and last 24 h imaging results.   Deward JINNY Foy, MD  01/07/2024

## 2024-01-07 NOTE — Progress Notes (Signed)
 PROGRESS NOTE    Chelsea Hansen  FMW:[id number] DOB: 1971-02-24 DOA: 12/22/2023 PCP: [name], NP   Chief Complaint  Patient presents with   Abdominal Pain   Emesis    Brief Narrative:  53 y.o. 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   Imaging showed a duodenal obstruction and masslike area of the pancreas.  GI and General Surgery consulted.     EUS attempted on 7/7, but unsuccessful secondary to duodenal stenosis.     Repeat EGD/EUS on 7/9 again unsuccessful in biopsy.  Gen surg consulted and NG tube was placed, patient started on TPN, patient subsequently underwent laparoscopic creation of gastrojejunostomy for duodenal stricture.  Patient started on clear liquids and diet advanced to full liquid diet. - Per general surgery.     Assessment & Plan:   Principal Problem:   AKI (acute kidney injury) (HCC) Active Problems:   Hypokalemia   Acute kidney injury superimposed on chronic kidney disease (HCC)   Hypocalcemia   Breast cancer metastasized to bone, right (HCC)   HLD (hyperlipidemia)   Constipation   Dehydration   Prolonged QT interval   Acute esophagitis   Candida esophagitis (HCC)   Transaminitis   Pancreatic mass   Right sided weakness   Hypotension   Abnormal CT of the abdomen   Elevated liver enzymes   Acute kidney injury (HCC)   Abnormal MRI of abdomen   Pancreatic lesion   Unintentional weight loss   Nausea and vomiting   Duodenal obstruction   Hypophosphatemia   Hypomagnesemia  #1 pancreatic mass with duodenal stricture/SBO in the setting of pancreatic mass/??  Ileus -Patient noted to have a new mass noted on imaging of the pancreas noted on CT abdomen and pelvis. - Patient underwent MRCP which was concerning for expansive, masslike appearance of the pancreatic head and uncinate as noted on prior CT measuring 2.5 x 2.3 cm which was  noted to be new and interval to prior restaging PET/CT dated 06/10/2023 and highly concerning for pancreatic adenocarcinoma. -CA 19-9 noted at 465. - Patient seen in consultation by GI however unfortunately EUS which was attempted on 7 7 was unsuccessful secondary to duodenal stenosis with repeat EGD/EUS done on 7 /9 noted to be unsuccessful and unable to get a biopsy. - General Surgery was consulted and patient underwent a gastrojejunal bypass surgery on 7/14 with no concerning nodules or evidence of metastatic disease on diagnostic laparoscopy. - Patient currently on TPN with diet advancement per general surgery. - Patient noted to have tolerated full liquid diet on 01/03/2024 however around 8 PM noted to had episode of emesis. -Patient noted with emesis the morning of 01/05/2024 and again this morning 01/06/2024.   -Continue scheduled Reglan  10 mg every 6 hours. -Continue IV PPI. - continue full liquid diet, Ensure, TPN as per general surgery. - Discontinued fentanyl  PCA pump and placed on IV Dilaudid  as needed severe pain.  Patient also placed on as needed oxycodone . -Patient noted to have low blood pressure after receiving IV Dilaudid  this afternoon and as such we will discontinue IV Dilaudid . -Place on as needed Toradol . -Per general surgery upper GI on 7/18 without complication.   - Patient still with ongoing nausea and emesis with concerns for possible ileus.   - Keep potassium approximately 4, magnesium  approximately 2, mobilize.  - General Surgery following and appreciate the input and recommendations.  2.  Hypertension -BP soft/borderline.   - Continue to hold home antihypertensive medications.   3.  Moderate protein calorie malnutrition - On TPN.   - On full liquids.    4.  Candida esophagitis - Status post full course fluconazole .   5.  Chronic asthma -Stable.  6.  AKI on CKD stage 3a - Patient noted to have a creatinine of 3.2 on admission which improved with hydration and  resolved, creatinine currently at 0.47.  7.  Hypophosphatemia/hypomagnesemia/hypomagnesemia -Being repleted per pharmacy as patient on TPN. - KCl 10 mEq IV every hour x 5 runs.  8.  Chronic anemia -Patient with no overt bleeding. - Hemoglobin at 8.0 from 7.6 after transfusion 1 unit PRBCs on 01/06/2024. - Follow. - Transfusion threshold hemoglobin < 8.  9.  Dizziness/lightheadedness/soft blood pressure/hypotension -Patient noted to have an episode of nausea and abdominal pain after drinking soup this afternoon. - Patient given IV Dilaudid , IV Compazine  and noted to have transient unresponsiveness of less than 1 minute and subsequently improved.  Repeat vitals at that time noted with initial blood pressure of 110/66 with blood pressure dropping down into the high 80s to low 90s. -Check CBC, CBG, BMET. - Discontinue IV Dilaudid . - 1 L normal saline bolus. - Normal saline 125 cc an hour.   DVT prophylaxis: Lovenox  Code Status: Full Family Communication: Updated patient. Disposition: TBD  Status is: Inpatient Remains inpatient appropriate because: Severity of illness   Consultants:  Gastroenterology: Dr. Albertus 12/23/2023 General Surgery: Dr. Vernetta 12/27/2023 Dr. Odean   Procedures:  CT abdomen and pelvis 12/29/2023 Abdominal films 12/24/2023, 12/28/2023, 12/30/2023 Chest x-ray 01/03/2024 PICC line placement MRCP 12/23/2023 Right upper quadrant ultrasound 12/22/2023 EGD/EUS 12/26/2023 by Dr. Wilhelmenia Laparoscopic creation gastrojejunostomy by general surgery: Dr. Lyndel 01/02/2024 Transfusion 1 unit PRBCs 01/06/2024. Upper GI series  Antimicrobials:  Anti-infectives (From admission, onward)    Start     Dose/Rate Route Frequency Ordered Stop   01/02/24 1245  ceFAZolin  (ANCEF ) 2-4 GM/100ML-% IVPB       Note to Pharmacy: Kathern Quant: cabinet override      01/02/24 1245 01/03/24 0059   12/22/23 2230  fluconazole  (DIFLUCAN ) IVPB 200 mg  Status:  Discontinued        200  mg 100 mL/hr over 60 Minutes Intravenous Every 24 hours 12/22/23 2132 01/03/24 1126         Subjective: Patient sleeping but easily arousable.  Denies any chest pain or shortness of breath.  Stated had significant emesis overnight but none this morning.  Describes emesis as bilious.  Seems frustrated.  Passing flatus.  No bowel movement.   Was called by RN as patient noted this afternoon to have some abdominal pain and nausea after drinking soup, patient received 10 mg of IV Dilaudid  as well as IV Compazine  and 3 minutes afterwards patient stated felt funny laid back and noted to have a transient episode of nonresponsiveness less than a minute.  Patient noted to have soft blood pressure at that time.  Objective: Vitals:   01/06/24 1952 01/07/24 0430 01/07/24 0805 01/07/24 1100  BP: 107/68 (!) 91/52 113/72   Pulse: 78 76    Resp: 15 16    Temp: 98.6 F (37 C) 98.3 F (36.8 C)    TempSrc: Oral Oral    SpO2: 100% 97%    Weight:    82.4 kg  Height:        Intake/Output Summary (Last 24 hours) at 01/07/2024 1227 Last data filed at 01/07/2024 0600  Gross per 24 hour  Intake 3176.91 ml  Output 1200 ml  Net 1976.91 ml   Filed Weights   01/05/24 0832 01/06/24 0500 01/07/24 1100  Weight: 81.2 kg 81.7 kg 82.4 kg    Examination:  General exam: NAD. Respiratory system: CTAB.  No wheezes, no crackles, no rhonchi.  Fair air movement.  Speaking in full sentences.  Cardiovascular system: Regular rate rhythm no murmurs rubs or gallops.  No JVD.  No pitting lower extremity edema.  Gastrointestinal system: Abdomen is soft, nondistended, some tenderness to palpation around incision sites and right lower quadrant.  Hypoactive bowel sounds.  No rebound.  No guarding.   Central nervous system: Alert and oriented. No focal neurological deficits. Extremities: Symmetric 5 x 5 power. Skin: No rashes, lesions or ulcers Psychiatry: Judgement and insight appear normal. Mood & affect appropriate.      Data Reviewed: I have personally reviewed following labs and imaging studies  CBC: Recent Labs  Lab 01/02/24 0304 01/03/24 1036 01/05/24 0256 01/06/24 0416 01/06/24 1959 01/07/24 0358  WBC 4.5 6.4 5.2 4.8  --  4.2  NEUTROABS  --  4.1 2.6  --   --   --   HGB 8.8* 7.9* 7.4* 7.6* 8.7* 8.0*  HCT 28.1* 26.1* 24.0* 24.7* 27.4* 25.5*  MCV 75.3* 75.2* 75.0* 75.5*  --  76.1*  PLT 187 195 188 185  --  153    Basic Metabolic Panel: Recent Labs  Lab 01/02/24 0304 01/03/24 0443 01/04/24 0351 01/05/24 0256 01/06/24 0416 01/07/24 0358  NA 137 133* 133* 135 134*  134* 134*  K 4.6 5.0 3.8 3.9 3.8  3.8 3.4*  CL 107 105 103 104 104  104 105  CO2 21* 22 23 23 23  22 22   GLUCOSE 133* 267* 119* 123* 141*  140* 127*  BUN 16 13 12 16 19  19 18   CREATININE 0.75 0.62 0.56 0.69 0.57  0.56 0.47  CALCIUM  7.9* 8.3* 8.0* 7.8* 7.8*  7.8* 7.7*  MG 1.9 1.7 1.5* 2.2 2.2 2.1  PHOS 2.7 1.5* 2.3* 3.6 3.4  3.4  --     GFR: Estimated Creatinine Clearance: 93 mL/min (by C-G formula based on SCr of 0.47 mg/dL).  Liver Function Tests: Recent Labs  Lab 01/02/24 0304 01/03/24 0443 01/04/24 0351 01/05/24 0256 01/06/24 0416  AST 31 26 24  35  --   ALT 83* 57* 43 36  --   ALKPHOS 529* 556* 580* 673*  --   BILITOT 0.6 0.6 0.5 0.7  --   PROT 6.1* 6.7 6.3* 6.0*  --   ALBUMIN  2.8* 3.1* 3.0* 2.8* 2.8*    CBG: Recent Labs  Lab 01/06/24 1952 01/07/24 0000 01/07/24 0428 01/07/24 0722 01/07/24 1117  GLUCAP 132* 127* 126* 122* 116*     No results found for this or any previous visit (from the past 240 hours).       Radiology Studies: DG UGI W SINGLE CM (SOL OR THIN BA) Result Date: 01/06/2024 CLINICAL DATA:  53 year old female with a history of metastatic breast cancer who presented to the ED with several days of nausea and vomiting. Imaging revealed duodenal obstruction from mass-like area of the pancreas. Patient is currently 4 days s/p laparoscopic creation of  gastrojejunostomy with continued nausea and vomiting. Request for UGI. EXAM: DG UGI W SINGLE CM TECHNIQUE: Single contrast examination was then performed using thin liquid barium. This exam was performed by [facility] PA-C, and was supervised and interpreted by Dr. Newell  Blietz. FLUOROSCOPY: Radiation Exposure Index (as provided by the fluoroscopic device): 33.9 mGy Kerma COMPARISON:  CT A/P W CONTRAST - 12/29/23. FINDINGS: Esophagus:  Grossly normal. Esophageal motility:  Within normal limits. Gastroesophageal reflux:  None visualized. Stomach: Gastrojejunostomy without leak. Overall poor opacification of the stomach with further assessment limited by limited contrast ingestion. Gastric emptying: Not assessed. Duodenum:  Surgically absent.  Gastrojejunostomy. Other:  None. IMPRESSION: Expected postoperative changes after gastrojejunostomy. No evidence of contrast leak or extravasation. Electronically Signed   By: Newell Eke M.D.   On: 01/06/2024 13:09         Scheduled Meds:  (feeding supplement) PROSource Plus  30 mL Oral TID BM   Chlorhexidine  Gluconate Cloth  6 each Topical Daily   feeding supplement  1 Container Oral Q24H   methocarbamol  (ROBAXIN ) injection  500 mg Intravenous Q8H   metoCLOPramide  (REGLAN ) injection  10 mg Intravenous Q6H   pantoprazole  (PROTONIX ) IV  40 mg Intravenous Q12H   sodium chloride  flush  10-40 mL Intracatheter Q12H   Vitamin D  (Ergocalciferol )  50,000 Units Oral Q7 days   Continuous Infusions:  potassium chloride  10 mEq (01/07/24 1137)   TPN ADULT (ION) 90 mL/hr at 01/06/24 1716   TPN ADULT (ION)       LOS: 15 days    Time spent: 40 minutes    Toribio Hummer, MD [facility]   To contact the attending provider between 7A-7P or the covering provider during after hours 7P-7A, please log into the web site [email or web address] and access using universal [place] password for that web site. If you do not have the password, please call the  hospital operator.  01/07/2024, 12:27 PM

## 2024-01-08 ENCOUNTER — Inpatient Hospital Stay (HOSPITAL_COMMUNITY): Payer: MEDICAID

## 2024-01-08 DIAGNOSIS — B3781 Candidal esophagitis: Secondary | ICD-10-CM | POA: Diagnosis not present

## 2024-01-08 DIAGNOSIS — K315 Obstruction of duodenum: Secondary | ICD-10-CM | POA: Diagnosis not present

## 2024-01-08 DIAGNOSIS — E876 Hypokalemia: Secondary | ICD-10-CM | POA: Diagnosis not present

## 2024-01-08 DIAGNOSIS — N179 Acute kidney failure, unspecified: Secondary | ICD-10-CM | POA: Diagnosis not present

## 2024-01-08 LAB — BASIC METABOLIC PANEL WITH GFR
Anion gap: 7 (ref 5–15)
BUN: 13 mg/dL (ref 6–20)
CO2: 21 mmol/L — ABNORMAL LOW (ref 22–32)
Calcium: 7.7 mg/dL — ABNORMAL LOW (ref 8.9–10.3)
Chloride: 108 mmol/L (ref 98–111)
Creatinine, Ser: 0.39 mg/dL — ABNORMAL LOW (ref 0.44–1.00)
GFR, Estimated: >60 mL/min (ref >60)
Glucose, Bld: 123 mg/dL — ABNORMAL HIGH (ref 70–99)
Potassium: 3.6 mmol/L (ref 3.5–5.1)
Sodium: 136 mmol/L (ref 135–145)

## 2024-01-08 LAB — CBC
HCT: 24.8 % — ABNORMAL LOW (ref 36.0–46.0)
Hemoglobin: 7.7 g/dL — ABNORMAL LOW (ref 12.0–15.0)
MCH: 23.7 pg — ABNORMAL LOW (ref 26.0–34.0)
MCHC: 31 g/dL (ref 30.0–36.0)
MCV: 76.3 fL — ABNORMAL LOW (ref 80.0–100.0)
Platelets: 147 K/uL — ABNORMAL LOW (ref 150–400)
RBC: 3.25 MIL/uL — ABNORMAL LOW (ref 3.87–5.11)
RDW: 16.7 % — ABNORMAL HIGH (ref 11.5–15.5)
WBC: 4.4 K/uL (ref 4.0–10.5)
nRBC: 0.5 % — ABNORMAL HIGH (ref 0.0–0.2)

## 2024-01-08 LAB — MAGNESIUM: Magnesium: 2 mg/dL (ref 1.7–2.4)

## 2024-01-08 MED ORDER — POTASSIUM CHLORIDE 10 MEQ/100ML IV SOLN
10.0000 meq | INTRAVENOUS | Status: AC
  Administered 2024-01-08 (×5): 10 meq via INTRAVENOUS
  Filled 2024-01-08 (×5): qty 100

## 2024-01-08 MED ORDER — PROCHLORPERAZINE EDISYLATE 10 MG/2ML IJ SOLN
10.0000 mg | Freq: Once | INTRAMUSCULAR | Status: AC
  Administered 2024-01-08: 10 mg via INTRAVENOUS
  Filled 2024-01-08: qty 2

## 2024-01-08 MED ORDER — PROCHLORPERAZINE EDISYLATE 10 MG/2ML IJ SOLN
10.0000 mg | Freq: Four times a day (QID) | INTRAMUSCULAR | Status: DC | PRN
  Administered 2024-01-08 – 2024-01-12 (×11): 10 mg via INTRAVENOUS
  Filled 2024-01-08 (×11): qty 2

## 2024-01-08 MED ORDER — TRAVASOL 10 % IV SOLN
INTRAVENOUS | Status: AC
  Filled 2024-01-08: qty 1036.8

## 2024-01-08 MED ORDER — ONDANSETRON HCL 4 MG/2ML IJ SOLN
4.0000 mg | Freq: Once | INTRAMUSCULAR | Status: AC
  Administered 2024-01-08: 4 mg via INTRAVENOUS
  Filled 2024-01-08: qty 2

## 2024-01-08 MED ORDER — DIAZEPAM 5 MG/ML IJ SOLN
5.0000 mg | Freq: Four times a day (QID) | INTRAMUSCULAR | Status: DC | PRN
  Administered 2024-01-08 – 2024-01-16 (×11): 5 mg via INTRAVENOUS
  Filled 2024-01-08 (×11): qty 2

## 2024-01-08 NOTE — Progress Notes (Signed)
 6 Days Post-Op   Subjective/Chief Complaint: Continues to have large volume emesis about every 12 hours.   Objective: Vital signs in last 24 hours: Temp:  [98.1 F (36.7 C)-99.4 F (37.4 C)] 98.1 F (36.7 C) (07/20 0555) Pulse Rate:  [75-86] 86 (07/20 0555) Resp:  [12-18] 16 (07/20 0555) BP: (90-115)/(54-70) 101/56 (07/20 0555) SpO2:  [98 %-100 %] 100 % (07/20 0555) Weight:  [82.4 kg-82.6 kg] 82.6 kg (07/20 0500) Last BM Date : 12/30/23  Intake/Output from previous day: 07/19 0701 - 07/20 0700 In: 4323 [P.O.:25; I.V.:2478; IV Piggyback:1820] Out: 1500 [Emesis/NG output:1500] Intake/Output this shift: No intake/output data recorded.  Abdomen soft and minimally tender, incisions clean dry intact with glue  Lab Results:  Recent Labs    01/07/24 1742 01/08/24 0407  WBC 4.8 4.4  HGB 8.3* 7.7*  HCT 27.1* 24.8*  PLT 181 147*   BMET Recent Labs    01/07/24 1742 01/08/24 0407  NA 136 136  K 4.1 3.6  CL 109 108  CO2 22 21*  GLUCOSE 115* 123*  BUN 15 13  CREATININE 0.46 0.39*  CALCIUM  7.8* 7.7*   PT/INR No results for input(s): LABPROT, INR in the last 72 hours. ABG No results for input(s): PHART, HCO3 in the last 72 hours.  Invalid input(s): PCO2, PO2  Studies/Results: DG UGI W SINGLE CM (SOL OR THIN BA) Result Date: 01/06/2024 CLINICAL DATA:  53 year old female with a history of metastatic breast cancer who presented to the ED with several days of nausea and vomiting. Imaging revealed duodenal obstruction from mass-like area of the pancreas. Patient is currently 4 days s/p laparoscopic creation of gastrojejunostomy with continued nausea and vomiting. Request for UGI. EXAM: DG UGI W SINGLE CM TECHNIQUE: Single contrast examination was then performed using thin liquid barium. This exam was performed by [facility] PA-C, and was supervised and interpreted by Dr. Newell Eke. FLUOROSCOPY: Radiation Exposure Index (as provided by the fluoroscopic  device): 33.9 mGy Kerma COMPARISON:  CT A/P W CONTRAST - 12/29/23. FINDINGS: Esophagus:  Grossly normal. Esophageal motility:  Within normal limits. Gastroesophageal reflux:  None visualized. Stomach: Gastrojejunostomy without leak. Overall poor opacification of the stomach with further assessment limited by limited contrast ingestion. Gastric emptying: Not assessed. Duodenum:  Surgically absent.  Gastrojejunostomy. Other:  None. IMPRESSION: Expected postoperative changes after gastrojejunostomy. No evidence of contrast leak or extravasation. Electronically Signed   By: Newell Eke M.D.   On: 01/06/2024 13:09      Anti-infectives: Anti-infectives (From admission, onward)    Start     Dose/Rate Route Frequency Ordered Stop   01/02/24 1245  ceFAZolin  (ANCEF ) 2-4 GM/100ML-% IVPB       Note to Pharmacy: Kathern Quant: cabinet override      01/02/24 1245 01/03/24 0059   12/22/23 2230  fluconazole  (DIFLUCAN ) IVPB 200 mg  Status:  Discontinued        200 mg 100 mL/hr over 60 Minutes Intravenous Every 24 hours 12/22/23 2132 01/03/24 1126       Assessment/Plan: POD 6- S/P LAPAROSCOPIC CREATION GASTRJEJONOSTOMY, 7/14, Dr. Lyndel   -Continue TPN -Continue full liquid diet as tolerated, NPO -Reglan  -UGI 7/18 without complication  -We discussed delayed gastric emptying and options for treatment.  I offered a gastrojejunostomy tube for drainage of the stomach and downstream tube feeds.  Today she is more agreeable to this plan.  I will discuss the case with Dr. Stevie who takes over the general surgery service this week to see if he  feels a gastrojejunostomy tube would be feasible for this patient.  NPO at midnight  -CA 19-9 is 465   FEN -full liquid diet, Ensure, TPN VTE - ok for chemical prophylaxis, SCDs HTN HLD protein calorie nutrition - PICC, TPN   I reviewed hospitalist notes, last 24 h vitals and pain scores, last 48 h intake and output, last 24 h labs and trends,  and last 24 h imaging results.   Deward JINNY Foy, MD  01/08/2024

## 2024-01-08 NOTE — Progress Notes (Signed)
 PHARMACY - TOTAL PARENTERAL NUTRITION CONSULT NOTE   Indication: Pancreatic mass with duodenal stricture/obstruction  Patient Measurements: Height: 5' 11 (180.3 cm) Weight: 82.6 kg (182 lb 0.2 oz) IBW/kg (Calculated) : 70.8 TPN AdjBW (KG): 69.6 Body mass index is 25.39 kg/m. Usual Weight: 92.5 kg in April of this year  Assessment:  Pharmacy consulted to manage TPN for this 53 yo needing nutritional support while awaiting surgical intervention for pancreatic mass with duodenal stricture/obstruction in the setting of metastatic breast cancer resulting in poor oral intake, persistent nausea and vomiting, and weight loss.  7/20 Continues to have large volume emesis about every 12 hours. Patient now agreeable to plan for gastrojejunostomy tube for drainage of the stomach and downstream tube feeds.  Glucose / Insulin :  no Hx DM. BG goal <180 - CBGs all < 150 - 1 unit of insulin  used in the last 24 hrs Electrolytes: Na now WNL, K 3.6 - low; CoCa 8.66 (slightly low, maxed in TPN), all other lytes WNL Renal: SCr and BUN WNL  Hepatic: albumin  low at 2.8,  On 7/17: alk phos elevated and rising 673, LFTs WNL, Total Bilirubin WNL, triglycerides elevated at 261 (7/14) but stable since last checked Intake / Output; MIVF:  PO: 25 ml Output: Urine 2x, Emesis 1500 ml LBM charted: 7/19 1x GI Imaging: 7/4 MR abdomen MRCP: pancreatic mass, vertebral metastases, pancolonic diverticulosis 7/10 CT: pancreatic head equivocal fullness, small amount free pelvic fluid, widespread osseous metastatic disease, small hiatal hernia, colonic diverticulosis GI Surgeries / Procedures:  7/8 EGD 7/7 EUS, upper GI tract 7/14: bypass surgery for creation of G-Junostomy  Central access: PICC placed 7/9 TPN start date: 7/9  Nutritional Goals:  Goal TPN rate is 90 mL/hr (provides 104 g of protein and 2238 kcals per day)  RD Assessment: Estimated Needs Total Energy Estimated Needs: 2000-2300kcal/day Total Protein  Estimated Needs: 100-115g/day Total Fluid Estimated Needs: 2.1-2.4L/day  Current Nutrition:  -Advanced to FLD 7/15 - not tolerating w/ nausea & vomiting -TPN - Ensure max: DC'd b/c pt refused Boost Breeze daily - to start 7/19 Prosource Plus TID  Plan: This morning : KCL 10 mEQ q1h x 5 ordered by [facility]  Continue TPN with lipids at goal rate of 90 mL/hr at 1800 Electrolytes in TPN:  Na 150 mEq/L (max) K 40 mEq/L - increased Ca 10 mEq/L (max)  Mg 8 mEq/L Phos 25 mmol/L Cl:Ac 1:1 Add standard MVI and trace elements to TPN  SSI & CBGs discontinued on 7/19 Monitor TPN labs on Mon/Thurs, and PRN   Thank you for allowing pharmacy to be a part of this patient's care.  Eleanor EMERSON Agent, PharmD, BCPS Clinical Pharmacist [place] 01/08/2024 9:34 AM

## 2024-01-08 NOTE — Progress Notes (Signed)
 PROGRESS NOTE    Chelsea Hansen  FMW:[id number] DOB: 02/05/1971 DOA: 12/22/2023 PCP: [name], NP   Chief Complaint  Patient presents with   Abdominal Pain   Emesis    Brief Narrative:  53 y.o. 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   Imaging showed a duodenal obstruction and masslike area of the pancreas.  GI and General Surgery consulted.     EUS attempted on 7/7, but unsuccessful secondary to duodenal stenosis.     Repeat EGD/EUS on 7/9 again unsuccessful in biopsy.  Gen surg consulted and NG tube was placed, patient started on TPN, patient subsequently underwent laparoscopic creation of gastrojejunostomy for duodenal stricture.  Patient started on clear liquids and diet advanced to full liquid diet. - Per general surgery.     Assessment & Plan:   Principal Problem:   AKI (acute kidney injury) (HCC) Active Problems:   Hypokalemia   Acute kidney injury superimposed on chronic kidney disease (HCC)   Hypocalcemia   Breast cancer metastasized to bone, right (HCC)   HLD (hyperlipidemia)   Constipation   Dehydration   Prolonged QT interval   Acute esophagitis   Candida esophagitis (HCC)   Transaminitis   Pancreatic mass   Right sided weakness   Hypotension   Abnormal CT of the abdomen   Elevated liver enzymes   Acute kidney injury (HCC)   Abnormal MRI of abdomen   Pancreatic lesion   Unintentional weight loss   Nausea and vomiting   Duodenal obstruction   Hypophosphatemia   Hypomagnesemia  #1 pancreatic mass with duodenal stricture/SBO in the setting of pancreatic mass/??  Ileus -Patient noted to have a new mass noted on imaging of the pancreas noted on CT abdomen and pelvis. - Patient underwent MRCP which was concerning for expansive, masslike appearance of the pancreatic head and uncinate as noted on prior CT measuring 2.5 x 2.3 cm which was  noted to be new and interval to prior restaging PET/CT dated 06/10/2023 and highly concerning for pancreatic adenocarcinoma. -CA 19-9 noted at 465. - Patient seen in consultation by GI however unfortunately EUS which was attempted on 7 7 was unsuccessful secondary to duodenal stenosis with repeat EGD/EUS done on 7 /9 noted to be unsuccessful and unable to get a biopsy. - General Surgery was consulted and patient underwent a gastrojejunal bypass surgery on 7/14 with no concerning nodules or evidence of metastatic disease on diagnostic laparoscopy. - Patient currently on TPN with diet advancement per general surgery. - Patient noted to have tolerated full liquid diet on 01/03/2024 however around 8 PM noted to had episode of emesis. -Patient noted with emesis the morning of 01/05/2024 and again this morning 01/06/2024.   -Continue scheduled Reglan  10 mg every 6 hours. -Continue IV PPI. - continue full liquid diet, Ensure, TPN as per general surgery. - Discontinued fentanyl  PCA pump and placed on IV Dilaudid  as needed severe pain.  Patient also placed on as needed oxycodone . -Patient noted to have low blood pressure after receiving IV Dilaudid  on 01/07/2024 and as such IV Dilaudid  has been discontinued.   - Continue IV Toradol  as needed.  -Per general surgery upper GI on 7/18 without complication.   - Patient still with ongoing nausea and emesis with concerns for possible ileus.   - Keep potassium approximately 4, magnesium  approximately 2, mobilize.  - General Surgery following and have discussed with  patient options of a gastrojejunostomy tube for drainage of stomach and downstream tube feeds.   -There is also noted that patient would likely need to wait 8 weeks and undergo EGD EUS for further evaluation of pancreatic mass. - Per general surgery.  2.  Hypertension -BP soft/borderline.   - Continue to hold home antihypertensive medications.  - IV fluids.  3.  Moderate protein calorie  malnutrition - On TPN.   - On full liquids.    4.  Candida esophagitis - Status post full course fluconazole .   5.  Chronic asthma -Stable.  6.  AKI on CKD stage 3a - Patient noted to have a creatinine of 3.2 on admission which improved with hydration and resolved, creatinine currently at 0.39.  7.  Hypophosphatemia/hypomagnesemia/hypomagnesemia -Being repleted per pharmacy as patient on TPN. - KCl 10 mEq IV every hour x 5 runs. - Follow.  8.  Chronic anemia -Patient with no overt bleeding. - Hemoglobin at 7.7 from 7.4 after transfusion 1 unit PRBCs on 01/06/2024. -Likely dilutional effect. - Follow.  9.  Dizziness/lightheadedness/soft blood pressure/hypotension -Patient noted to have an episode of nausea and abdominal pain after drinking soup in the afternoon on 01/07/2024.  - Patient given IV Dilaudid , IV Compazine  and noted to have transient unresponsiveness of less than 1 minute and subsequently improved.  Repeat vitals at that time noted with initial blood pressure of 110/66 with blood pressure dropping down into the high 80s to low 90s. - IV Dilaudid  discontinued.   - Continue IV fluids.   - Supportive care.    DVT prophylaxis: Lovenox  Code Status: Full Family Communication: Updated patient.  Updated daughter at bedside. Disposition: TBD  Status is: Inpatient Remains inpatient appropriate because: Severity of illness   Consultants:  Gastroenterology: Dr. Albertus 12/23/2023 General Surgery: Dr. Vernetta 12/27/2023 Dr. Odean   Procedures:  CT abdomen and pelvis 12/29/2023 Abdominal films 12/24/2023, 12/28/2023, 12/30/2023 Chest x-ray 01/03/2024 PICC line placement MRCP 12/23/2023 Right upper quadrant ultrasound 12/22/2023 EGD/EUS 12/26/2023 by Dr. Wilhelmenia Laparoscopic creation gastrojejunostomy by general surgery: Dr. Lyndel 01/02/2024 Transfusion 1 unit PRBCs 01/06/2024. Upper GI series  Antimicrobials:  Anti-infectives (From admission, onward)    Start      Dose/Rate Route Frequency Ordered Stop   01/02/24 1245  ceFAZolin  (ANCEF ) 2-4 GM/100ML-% IVPB       Note to Pharmacy: Kathern Quant: cabinet override      01/02/24 1245 01/03/24 0059   12/22/23 2230  fluconazole  (DIFLUCAN ) IVPB 200 mg  Status:  Discontinued        200 mg 100 mL/hr over 60 Minutes Intravenous Every 24 hours 12/22/23 2132 01/03/24 1126         Subjective: Patient sitting up in bed getting ready to eat grits.  Denies any ongoing chest pain or shortness of breath.  Patient with abdominal discomfort/pain.  Noted to have bouts of nausea and emesis yesterday evening around 7:45 PM, then around 10:45 PM per patient and then at 2 AM.  No further nausea or vomiting this morning.    Objective: Vitals:   01/07/24 2022 01/07/24 2048 01/08/24 0500 01/08/24 0555  BP:  115/70  (!) 101/56  Pulse:  76  86  Resp: 12 16  16   Temp:  98.2 F (36.8 C)  98.1 F (36.7 C)  TempSrc:  Oral  Oral  SpO2:  100%  100%  Weight:   82.6 kg   Height:        Intake/Output Summary (Last 24 hours) at 01/08/2024 1234  Last data filed at 01/08/2024 1045 Gross per 24 hour  Intake 3963.05 ml  Output 1700 ml  Net 2263.05 ml   Filed Weights   01/06/24 0500 01/07/24 1100 01/08/24 0500  Weight: 81.7 kg 82.4 kg 82.6 kg    Examination:  General exam: NAD. Respiratory system: Lungs clear to auscultation bilaterally.  No wheezes, no crackles, no rhonchi.  Fair air movement.  Speaking in full sentences. Cardiovascular system: RRR no murmurs rubs or gallops.  No JVD.  No pitting lower extremity edema.  Gastrointestinal system: Abdomen is soft, nondistended, some tenderness to palpation around incision sites and right lower quadrant.  Hypoactive bowel sounds.  No rebound.  No guarding.  Central nervous system: Alert and oriented. No focal neurological deficits. Extremities: Symmetric 5 x 5 power. Skin: No rashes, lesions or ulcers Psychiatry: Judgement and insight appear normal. Mood & affect  appropriate.     Data Reviewed: I have personally reviewed following labs and imaging studies  CBC: Recent Labs  Lab 01/03/24 1036 01/05/24 0256 01/06/24 0416 01/06/24 1959 01/07/24 0358 01/07/24 1742 01/08/24 0407  WBC 6.4 5.2 4.8  --  4.2 4.8 4.4  NEUTROABS 4.1 2.6  --   --   --   --   --   HGB 7.9* 7.4* 7.6* 8.7* 8.0* 8.3* 7.7*  HCT 26.1* 24.0* 24.7* 27.4* 25.5* 27.1* 24.8*  MCV 75.2* 75.0* 75.5*  --  76.1* 76.8* 76.3*  PLT 195 188 185  --  153 181 147*    Basic Metabolic Panel: Recent Labs  Lab 01/02/24 0304 01/03/24 0443 01/04/24 0351 01/05/24 0256 01/06/24 0416 01/07/24 0358 01/07/24 1742 01/08/24 0407  NA 137 133* 133* 135 134*  134* 134* 136 136  K 4.6 5.0 3.8 3.9 3.8  3.8 3.4* 4.1 3.6  CL 107 105 103 104 104  104 105 109 108  CO2 21* 22 23 23 23  22 22 22  21*  GLUCOSE 133* 267* 119* 123* 141*  140* 127* 115* 123*  BUN 16 13 12 16 19  19 18 15 13   CREATININE 0.75 0.62 0.56 0.69 0.57  0.56 0.47 0.46 0.39*  CALCIUM  7.9* 8.3* 8.0* 7.8* 7.8*  7.8* 7.7* 7.8* 7.7*  MG 1.9 1.7 1.5* 2.2 2.2 2.1  --  2.0  PHOS 2.7 1.5* 2.3* 3.6 3.4  3.4  --   --   --     GFR: Estimated Creatinine Clearance: 93 mL/min (A) (by C-G formula based on SCr of 0.39 mg/dL (L)).  Liver Function Tests: Recent Labs  Lab 01/02/24 0304 01/03/24 0443 01/04/24 0351 01/05/24 0256 01/06/24 0416  AST 31 26 24  35  --   ALT 83* 57* 43 36  --   ALKPHOS 529* 556* 580* 673*  --   BILITOT 0.6 0.6 0.5 0.7  --   PROT 6.1* 6.7 6.3* 6.0*  --   ALBUMIN  2.8* 3.1* 3.0* 2.8* 2.8*    CBG: Recent Labs  Lab 01/07/24 0428 01/07/24 0722 01/07/24 1117 01/07/24 1430 01/07/24 1602  GLUCAP 126* 122* 116* 130* 131*     No results found for this or any previous visit (from the past 240 hours).       Radiology Studies: DG Abd Portable 1V Result Date: 01/08/2024 CLINICAL DATA:  Ileus EXAM: PORTABLE ABDOMEN - 1 VIEW COMPARISON:  Upper GI 01/06/2024.  CT 12/29/2023 FINDINGS: Gas is seen  in nondilated loops of large bowel. Scattered stool. Air towards the rectum. Minimal small bowel gas. Nonspecific. No obstruction. No  obvious free air on this portable supine radiograph. Of note the diaphragm is clipped off the edge of the film. Degenerative changes along the spine. IMPRESSION: Nonspecific bowel gas pattern.  Overall minimal small bowel gas. Electronically Signed   By: Ranell Bring M.D.   On: 01/08/2024 10:27   DG UGI W SINGLE CM (SOL OR THIN BA) Result Date: 01/06/2024 CLINICAL DATA:  53 year old female with a history of metastatic breast cancer who presented to the ED with several days of nausea and vomiting. Imaging revealed duodenal obstruction from mass-like area of the pancreas. Patient is currently 4 days s/p laparoscopic creation of gastrojejunostomy with continued nausea and vomiting. Request for UGI. EXAM: DG UGI W SINGLE CM TECHNIQUE: Single contrast examination was then performed using thin liquid barium. This exam was performed by [facility] PA-C, and was supervised and interpreted by Dr. Newell Eke. FLUOROSCOPY: Radiation Exposure Index (as provided by the fluoroscopic device): 33.9 mGy Kerma COMPARISON:  CT A/P W CONTRAST - 12/29/23. FINDINGS: Esophagus:  Grossly normal. Esophageal motility:  Within normal limits. Gastroesophageal reflux:  None visualized. Stomach: Gastrojejunostomy without leak. Overall poor opacification of the stomach with further assessment limited by limited contrast ingestion. Gastric emptying: Not assessed. Duodenum:  Surgically absent.  Gastrojejunostomy. Other:  None. IMPRESSION: Expected postoperative changes after gastrojejunostomy. No evidence of contrast leak or extravasation. Electronically Signed   By: Newell Eke M.D.   On: 01/06/2024 13:09         Scheduled Meds:  (feeding supplement) PROSource Plus  30 mL Oral TID BM   Chlorhexidine  Gluconate Cloth  6 each Topical Daily   feeding supplement  1 Container Oral Q24H    methocarbamol  (ROBAXIN ) injection  500 mg Intravenous Q8H   pantoprazole  (PROTONIX ) IV  40 mg Intravenous Q12H   sodium chloride  flush  10-40 mL Intracatheter Q12H   Vitamin D  (Ergocalciferol )  50,000 Units Oral Q7 days   Continuous Infusions:  sodium chloride  125 mL/hr at 01/08/24 0650   potassium chloride  10 mEq (01/08/24 1149)   TPN ADULT (ION) 90 mL/hr at 01/08/24 0755   TPN ADULT (ION)       LOS: 16 days    Time spent: 40 minutes    Toribio Hummer, MD [facility]   To contact the attending provider between 7A-7P or the covering provider during after hours 7P-7A, please log into the web site [email or web address] and access using universal [place] password for that web site. If you do not have the password, please call the hospital operator.  01/08/2024, 12:34 PM

## 2024-01-09 ENCOUNTER — Inpatient Hospital Stay (HOSPITAL_COMMUNITY): Payer: MEDICAID

## 2024-01-09 DIAGNOSIS — N179 Acute kidney failure, unspecified: Secondary | ICD-10-CM | POA: Diagnosis not present

## 2024-01-09 DIAGNOSIS — E876 Hypokalemia: Secondary | ICD-10-CM | POA: Diagnosis not present

## 2024-01-09 DIAGNOSIS — K315 Obstruction of duodenum: Secondary | ICD-10-CM | POA: Diagnosis not present

## 2024-01-09 DIAGNOSIS — B3781 Candidal esophagitis: Secondary | ICD-10-CM | POA: Diagnosis not present

## 2024-01-09 LAB — CBC WITH DIFFERENTIAL/PLATELET
Abs Immature Granulocytes: 0.52 K/uL — ABNORMAL HIGH (ref 0.00–0.07)
Basophils Absolute: 0 K/uL (ref 0.0–0.1)
Basophils Relative: 1 %
Eosinophils Absolute: 0.1 K/uL (ref 0.0–0.5)
Eosinophils Relative: 2 %
HCT: 26.9 % — ABNORMAL LOW (ref 36.0–46.0)
Hemoglobin: 8.5 g/dL — ABNORMAL LOW (ref 12.0–15.0)
Immature Granulocytes: 12 %
Lymphocytes Relative: 26 %
Lymphs Abs: 1.1 K/uL (ref 0.7–4.0)
MCH: 24.1 pg — ABNORMAL LOW (ref 26.0–34.0)
MCHC: 31.6 g/dL (ref 30.0–36.0)
MCV: 76.4 fL — ABNORMAL LOW (ref 80.0–100.0)
Monocytes Absolute: 0.3 K/uL (ref 0.1–1.0)
Monocytes Relative: 8 %
Neutro Abs: 2.3 K/uL (ref 1.7–7.7)
Neutrophils Relative %: 51 %
Platelets: 182 K/uL (ref 150–400)
RBC: 3.52 MIL/uL — ABNORMAL LOW (ref 3.87–5.11)
RDW: 17.4 % — ABNORMAL HIGH (ref 11.5–15.5)
WBC: 4.4 K/uL (ref 4.0–10.5)
nRBC: 0.9 % — ABNORMAL HIGH (ref 0.0–0.2)

## 2024-01-09 LAB — BPAM RBC
Blood Product Expiration Date: 202508112359
ISSUE DATE / TIME: 202507181536
Unit Type and Rh: 7300

## 2024-01-09 LAB — COMPREHENSIVE METABOLIC PANEL WITH GFR
ALT: 31 U/L (ref 0–44)
AST: 26 U/L (ref 15–41)
Albumin: 2.8 g/dL — ABNORMAL LOW (ref 3.5–5.0)
Alkaline Phosphatase: 823 U/L — ABNORMAL HIGH (ref 38–126)
Anion gap: 8 (ref 5–15)
BUN: 14 mg/dL (ref 6–20)
CO2: 22 mmol/L (ref 22–32)
Calcium: 8.1 mg/dL — ABNORMAL LOW (ref 8.9–10.3)
Chloride: 109 mmol/L (ref 98–111)
Creatinine, Ser: 0.46 mg/dL (ref 0.44–1.00)
GFR, Estimated: >60 mL/min (ref >60)
Glucose, Bld: 114 mg/dL — ABNORMAL HIGH (ref 70–99)
Potassium: 4.1 mmol/L (ref 3.5–5.1)
Sodium: 139 mmol/L (ref 135–145)
Total Bilirubin: 0.6 mg/dL (ref 0.0–1.2)
Total Protein: 6.2 g/dL — ABNORMAL LOW (ref 6.5–8.1)

## 2024-01-09 LAB — PHOSPHORUS: Phosphorus: 2.5 mg/dL (ref 2.5–4.6)

## 2024-01-09 LAB — TYPE AND SCREEN
ABO/RH(D): B POS
Antibody Screen: NEGATIVE
Unit division: 0

## 2024-01-09 LAB — TRIGLYCERIDES: Triglycerides: 274 mg/dL — ABNORMAL HIGH (ref <150)

## 2024-01-09 LAB — MAGNESIUM: Magnesium: 2.1 mg/dL (ref 1.7–2.4)

## 2024-01-09 MED ORDER — CARMEX CLASSIC LIP BALM EX OINT: TOPICAL_OINTMENT | CUTANEOUS | Status: DC | PRN

## 2024-01-09 MED ORDER — SODIUM CHLORIDE 0.9 % IV SOLN: INTRAVENOUS | Status: AC

## 2024-01-09 MED ORDER — TRAVASOL 10 % IV SOLN
INTRAVENOUS | Status: AC
  Filled 2024-01-09: qty 1036.8

## 2024-01-09 MED ORDER — CEFAZOLIN SODIUM-DEXTROSE 2-4 GM/100ML-% IV SOLN
2.0000 g | INTRAVENOUS | Status: AC
  Administered 2024-01-10: 2 g via INTRAVENOUS
  Filled 2024-01-09: qty 100

## 2024-01-09 MED ORDER — ONDANSETRON 8 MG/NS 50 ML IVPB
8.0000 mg | Freq: Four times a day (QID) | INTRAVENOUS | Status: DC | PRN
  Administered 2024-01-09 – 2024-01-12 (×7): 8 mg via INTRAVENOUS
  Filled 2024-01-09 (×4): qty 8
  Filled 2024-01-09: qty 54
  Filled 2024-01-09 (×2): qty 8

## 2024-01-09 NOTE — Progress Notes (Addendum)
 Physical Therapy Treatment Patient Details Name: Chelsea Hansen MRN: [id number] DOB: [birth year] Today's Date: 01/09/2024   History of Present Illness Pt is a 53 y.o. female presenting with nausea and vomiting and admitted for acute kidney injury on 12/22/23.  Pt S/P LAPAROSCOPIC CREATION GASTRJEJONOSTOMY 01/01/24.  Past medical history significant of breast cancer, Duodenal stenosis, esophagitis Constipation, HLD, HTN , anemia, DVT, asthma, SVT.    PT Comments  Pt up in bathroom performing hygiene care upon therapist's arrival. Pt reports nausea/vomiting all night but wanting to ambulate with therapy. Pt amb 200 ft with IV pole, slow and steady cadence, maneuvering IV pole around obstacles in hallway and room, supv for safety. Pt denies dizziness upon therapist's arrival and during ambulation, but reports dizziness post amb; VSS. Pt reports having surgery tomorrow 7/22; encouraged to amb in hallway with nursing again today if able. Will continue to progress as able acutely.   If plan is discharge home, recommend the following: A little help with bathing/dressing/bathroom;Assistance with cooking/housework;Assist for transportation;Supervision due to cognitive status   Can travel by private vehicle        Equipment Recommendations  None recommended by PT    Recommendations for Other Services       Precautions / Restrictions Precautions Precautions: Fall Recall of Precautions/Restrictions: Intact Restrictions Weight Bearing Restrictions Per Provider Order: No     Mobility  Bed Mobility               General bed mobility comments: mod ind to return to long sitting in bed wtih HOB elevated, using bedrail, slow mobility    Transfers Overall transfer level: Needs assistance   Transfers: Sit to/from Stand Sit to Stand: Supervision           General transfer comment: IV pole to power to stand, slow to rise    Ambulation/Gait Ambulation/Gait assistance: Supervision Gait  Distance (Feet): 200 Feet Assistive device: IV Pole Gait Pattern/deviations: Decreased stride length, Step-through pattern, Trunk flexed Gait velocity: decreased     General Gait Details: trunk slightly forward flexed, slow cadence, maneuvering IV pole around obstacles and in hallway, reports dizziness once back in room and sitting, BP 116/86 HR 101 and SPO2 100% on RA, denies dizziness at beginning of amb and during   Stairs             Wheelchair Mobility     Tilt Bed    Modified Rankin (Stroke Patients Only)       Balance Overall balance assessment: No apparent balance deficits (not formally assessed)                                          Communication Communication Communication: No apparent difficulties  Cognition Arousal: Alert Behavior During Therapy: WFL for tasks assessed/performed   PT - Cognitive impairments: No apparent impairments                         Following commands: Intact      Cueing    Exercises      General Comments General comments (skin integrity, edema, etc.): VSS      Pertinent Vitals/Pain Pain Assessment Pain Assessment: Faces Faces Pain Scale: Hurts a little bit Pain Location: generalized with movement Pain Descriptors / Indicators: Grimacing Pain Intervention(s): Limited activity within patient's tolerance, Monitored during session, Repositioned (pt has kpad in room)  Home Living                          Prior Function            PT Goals (current goals can now be found in the care plan section) Acute Rehab PT Goals Patient Stated Goal: to get better PT Goal Formulation: With patient Time For Goal Achievement: 01/17/24 Potential to Achieve Goals: Good Progress towards PT goals: Progressing toward goals    Frequency    Min 3X/week      PT Plan      Co-evaluation              AM-PAC PT 6 Clicks Mobility   Outcome Measure  Help needed turning from  your back to your side while in a flat bed without using bedrails?: A Little Help needed moving from lying on your back to sitting on the side of a flat bed without using bedrails?: A Little Help needed moving to and from a bed to a chair (including a wheelchair)?: A Little Help needed standing up from a chair using your arms (e.g., wheelchair or bedside chair)?: A Little Help needed to walk in hospital room?: A Little Help needed climbing 3-5 steps with a railing? : A Little 6 Click Score: 18    End of Session Equipment Utilized During Treatment: Gait belt Activity Tolerance: Patient tolerated treatment well Patient left: in bed;with call bell/phone within reach;with bed alarm set Nurse Communication: Mobility status PT Visit Diagnosis: Difficulty in walking, not elsewhere classified (R26.2);Muscle weakness (generalized) (M62.81)     Time: 8986-8973 PT Time Calculation (min) (ACUTE ONLY): 13 min  Charges:    $Gait Training: 8-22 mins PT General Charges $$ ACUTE PT VISIT: 1 Visit                     Tori Kalani Sthilaire PT, DPT 01/09/24, 10:36 AM

## 2024-01-09 NOTE — TOC Progression Note (Signed)
 Transition of Care [facility]) - Progression Note    Patient Details  Name: Chelsea Hansen MRN: [id number] Date of Birth: 01-28-1971  [facility] CM/SW Contact  Tawni CHRISTELLA Eva, LCSW Phone Number: 01/09/2024, 11:07 AM  Clinical Narrative:     CSW has confirmed pt received bedside commode. CSW spoke with pt, she has reported her concerns about her care at the hospital and would like t speak with someone about discharging home. CSW sent message to pt's RN and MD. Care Management to follow.   Expected Discharge Plan: Home/Self Care Barriers to Discharge: Continued Medical Work up  Expected Discharge Plan and Services       Living arrangements for the past 2 months: Apartment                                       Social Determinants of Health (SDOH) Interventions SDOH Screenings   Food Insecurity: No Food Insecurity (12/22/2023)  Recent Concern: Food Insecurity - Food Insecurity Present (11/24/2023)  Housing: Low Risk  (12/22/2023)  Transportation Needs: No Transportation Needs (12/22/2023)  Utilities: Not At Risk (12/22/2023)  Depression (PHQ2-9): Low Risk  (12/22/2023)  Recent Concern: Depression (PHQ2-9) - High Risk (11/30/2023)  Financial Resource Strain: High Risk (01/14/2022)  Physical Activity: Insufficiently Active (08/01/2023)  Stress: Stress Concern Present (10/10/2023)  Tobacco Use: Low Risk  (01/02/2024)    Readmission Risk Interventions    12/07/2023   12:21 PM 11/26/2023    2:58 PM  Readmission Risk Prevention Plan  Transportation Screening Complete Complete  PCP or Specialist Appt within 3-5 Days  Complete  HRI or Home Care Consult  Complete  Social Work Consult for Recovery Care Planning/Counseling  Complete  Palliative Care Screening  Not Applicable  Medication Review [employer]) Complete Complete  PCP or Specialist appointment within 3-5 days of discharge Complete   HRI or Home Care Consult Complete   SW Recovery Care/Counseling Consult  Complete   Palliative Care Screening Not Applicable   Skilled Nursing Facility Not Applicable

## 2024-01-09 NOTE — Plan of Care (Signed)

## 2024-01-09 NOTE — Progress Notes (Signed)
 PROGRESS NOTE    Chelsea Hansen  FMW:[id number] DOB: 07-04-1970 DOA: 12/22/2023 PCP: [name], NP   Chief Complaint  Patient presents with   Abdominal Pain   Emesis    Brief Narrative:  53 y.o. 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   Imaging showed a duodenal obstruction and masslike area of the pancreas.  GI and General Surgery consulted.     EUS attempted on 7/7, but unsuccessful secondary to duodenal stenosis.     Repeat EGD/EUS on 7/9 again unsuccessful in biopsy.  Gen surg consulted and NG tube was placed, patient started on TPN, patient subsequently underwent laparoscopic creation of gastrojejunostomy for duodenal stricture.  Patient started on clear liquids and diet advanced to full liquid diet. - Per general surgery.     Assessment & Plan:   Principal Problem:   AKI (acute kidney injury) (HCC) Active Problems:   Hypokalemia   Acute kidney injury superimposed on chronic kidney disease (HCC)   Hypocalcemia   Breast cancer metastasized to bone, right (HCC)   HLD (hyperlipidemia)   Constipation   Dehydration   Prolonged QT interval   Acute esophagitis   Candida esophagitis (HCC)   Transaminitis   Pancreatic mass   Right sided weakness   Hypotension   Abnormal CT of the abdomen   Elevated liver enzymes   Acute kidney injury (HCC)   Abnormal MRI of abdomen   Pancreatic lesion   Unintentional weight loss   Nausea and vomiting   Duodenal obstruction   Hypophosphatemia   Hypomagnesemia  #1 pancreatic mass with duodenal stricture/SBO in the setting of pancreatic mass/??  Ileus -Patient noted to have a new mass noted on imaging of the pancreas noted on CT abdomen and pelvis. - Patient underwent MRCP which was concerning for expansive, masslike appearance of the pancreatic head and uncinate as noted on prior CT measuring 2.5 x 2.3 cm which was  noted to be new and interval to prior restaging PET/CT dated 06/10/2023 and highly concerning for pancreatic adenocarcinoma. -CA 19-9 noted at 465. - Patient seen in consultation by GI however unfortunately EUS which was attempted on 7 7 was unsuccessful secondary to duodenal stenosis with repeat EGD/EUS done on 7 /9 noted to be unsuccessful and unable to get a biopsy. - General Surgery was consulted and patient underwent a gastrojejunal bypass surgery on 7/14 with no concerning nodules or evidence of metastatic disease on diagnostic laparoscopy. - Patient currently on TPN with diet advancement per general surgery. - Patient noted to have tolerated full liquid diet on 01/03/2024 however around 8 PM noted to had episode of emesis. -Patient noted with emesis the morning of 01/05/2024 and has been having emesis at least twice daily with last episode of emesis early on this morning, 01/09/2024.    - Was on scheduled Reglan  which has been discontinued.   -Continue IV PPI. - continue Ensure, TPN as per general surgery. -Was on full liquid diet however currently n.p.o. in anticipation of possible procedure. - Discontinued fentanyl  PCA pump and placed on IV Dilaudid  as needed severe pain.  Patient also placed on as needed oxycodone . -Patient noted to have low blood pressure after receiving IV Dilaudid  on 01/07/2024 and as such IV Dilaudid  has been discontinued.   - Continue IV Toradol  as needed.  -Per general surgery upper GI on 7/18 without complication.   - Patient still with ongoing  nausea and emesis with concerns for possible ileus.   - Keep potassium approximately 4, magnesium  approximately 2, mobilize.  - General Surgery following and have discussed with patient options of a gastrojejunostomy tube for drainage of stomach and downstream tube for feeds.   -There is also noted.  General surgery, that patient would likely need to wait 8 weeks and undergo EGD EUS for further evaluation of pancreatic mass. -  Per general surgery.  2.  Hypertension -BP soft/borderline.   - Continue to hold home antihypertensive medications.  - IV fluids.  3.  Moderate protein calorie malnutrition - On TPN.   - Currently NPO.   4.  Candida esophagitis - Status post full course fluconazole .   5.  Chronic asthma -Stable.  6.  AKI on CKD stage 3a - Patient noted to have a creatinine of 3.2 on admission which improved with hydration and resolved, creatinine currently at 0.46.  7.  Hypophosphatemia/hypomagnesemia/hypomagnesemia -Being repleted per pharmacy as patient on TPN. - Potassium of 4.1.   - Phosphorus at 2.5.   - Magnesium  at 2.1.  - Follow.  8.  Chronic anemia -Patient with no overt bleeding. - Hemoglobin at 8.5 from 7.4 after transfusion 1 unit PRBCs on 01/06/2024. -Likely dilutional effect. - Follow.  9.  Dizziness/lightheadedness/soft blood pressure/hypotension -Patient noted to have an episode of nausea and abdominal pain after drinking soup in the afternoon on 01/07/2024.  - Patient given IV Dilaudid , IV Compazine  and noted to have transient unresponsiveness of less than 1 minute and subsequently improved.  Repeat vitals at that time noted with initial blood pressure of 110/66 with blood pressure dropping down into the high 80s to low 90s. -Resolved. -Patient currently asymptomatic and back to baseline. - IV Dilaudid  discontinued.   - Continue IV fluids.   - Supportive care.    DVT prophylaxis: Lovenox  Code Status: Full Family Communication: Updated patient.  No family at bedside. Disposition: TBD  Status is: Inpatient Remains inpatient appropriate because: Severity of illness   Consultants:  Gastroenterology: Dr. Albertus 12/23/2023 General Surgery: Dr. Vernetta 12/27/2023 Dr. Odean   Procedures:  CT abdomen and pelvis 12/29/2023 Abdominal films 12/24/2023, 12/28/2023, 12/30/2023 Chest x-ray 01/03/2024 PICC line placement MRCP 12/23/2023 Right upper quadrant ultrasound  12/22/2023 EGD/EUS 12/26/2023 by Dr. Wilhelmenia Laparoscopic creation gastrojejunostomy by general surgery: Dr. Lyndel 01/02/2024 Transfusion 1 unit PRBCs 01/06/2024. Upper GI series  Antimicrobials:  Anti-infectives (From admission, onward)    Start     Dose/Rate Route Frequency Ordered Stop   01/02/24 1245  ceFAZolin  (ANCEF ) 2-4 GM/100ML-% IVPB       Note to Pharmacy: Kathern Quant: cabinet override      01/02/24 1245 01/03/24 0059   12/22/23 2230  fluconazole  (DIFLUCAN ) IVPB 200 mg  Status:  Discontinued        200 mg 100 mL/hr over 60 Minutes Intravenous Every 24 hours 12/22/23 2132 01/03/24 1126         Subjective: Patient sitting up in bed, noted to have had multiple episodes of emesis overnight noted emesis at 8 PM, 8:30 PM, 9 PM, 2 AM, 4 AM, 5 AM this morning.  Patient denies any chest pain or shortness of breath.  Passing flatus.  No bowel movement.  Patient states saw surgical PA early on this morning and procedure was to be planned for tomorrow for a gastric tube per patient.  Patient asking for diet or even clear liquids as she is feels she is going to get dehydrated being NPO.  Objective:  Vitals:   01/08/24 0555 01/08/24 1353 01/09/24 0500 01/09/24 0541  BP: (!) 101/56 (!) 137/90  110/77  Pulse: 86 82  92  Resp: 16 16  18   Temp: 98.1 F (36.7 C) 98.2 F (36.8 C)  99.4 F (37.4 C)  TempSrc: Oral Oral  Oral  SpO2: 100% 100%  96%  Weight:   81.9 kg   Height:        Intake/Output Summary (Last 24 hours) at 01/09/2024 1219 Last data filed at 01/09/2024 0606 Gross per 24 hour  Intake 5546.31 ml  Output 2750 ml  Net 2796.31 ml   Filed Weights   01/07/24 1100 01/08/24 0500 01/09/24 0500  Weight: 82.4 kg 82.6 kg 81.9 kg    Examination:  General exam: NAD. Respiratory system: CTAB.  No wheezes, no crackles, no rhonchi.  Fair air movement.  Speaking in full sentences.  Cardiovascular system: Regular rate rhythm no murmurs rubs or gallops.  No JVD.  No  lower extremity edema.  Gastrointestinal system: Abdomen is soft, nondistended, some tenderness to palpation around incision sites and right lower quadrant.  Hypoactive bowel sounds.  No rebound.  No guarding.  Central nervous system: Alert and oriented. No focal neurological deficits. Extremities: Symmetric 5 x 5 power. Skin: No rashes, lesions or ulcers Psychiatry: Judgement and insight appear normal. Mood & affect appropriate.     Data Reviewed: I have personally reviewed following labs and imaging studies  CBC: Recent Labs  Lab 01/03/24 1036 01/05/24 0256 01/06/24 0416 01/06/24 1959 01/07/24 0358 01/07/24 1742 01/08/24 0407 01/09/24 0346  WBC 6.4 5.2 4.8  --  4.2 4.8 4.4 4.4  NEUTROABS 4.1 2.6  --   --   --   --   --  2.3  HGB 7.9* 7.4* 7.6* 8.7* 8.0* 8.3* 7.7* 8.5*  HCT 26.1* 24.0* 24.7* 27.4* 25.5* 27.1* 24.8* 26.9*  MCV 75.2* 75.0* 75.5*  --  76.1* 76.8* 76.3* 76.4*  PLT 195 188 185  --  153 181 147* 182    Basic Metabolic Panel: Recent Labs  Lab 01/03/24 0443 01/04/24 0351 01/05/24 0256 01/06/24 0416 01/07/24 0358 01/07/24 1742 01/08/24 0407 01/09/24 0346  NA 133* 133* 135 134*  134* 134* 136 136 139  K 5.0 3.8 3.9 3.8  3.8 3.4* 4.1 3.6 4.1  CL 105 103 104 104  104 105 109 108 109  CO2 22 23 23 23  22 22 22  21* 22  GLUCOSE 267* 119* 123* 141*  140* 127* 115* 123* 114*  BUN 13 12 16 19  19 18 15 13 14   CREATININE 0.62 0.56 0.69 0.57  0.56 0.47 0.46 0.39* 0.46  CALCIUM  8.3* 8.0* 7.8* 7.8*  7.8* 7.7* 7.8* 7.7* 8.1*  MG 1.7 1.5* 2.2 2.2 2.1  --  2.0 2.1  PHOS 1.5* 2.3* 3.6 3.4  3.4  --   --   --  2.5    GFR: Estimated Creatinine Clearance: 93 mL/min (by C-G formula based on SCr of 0.46 mg/dL).  Liver Function Tests: Recent Labs  Lab 01/03/24 0443 01/04/24 0351 01/05/24 0256 01/06/24 0416 01/09/24 0346  AST 26 24 35  --  26  ALT 57* 43 36  --  31  ALKPHOS 556* 580* 673*  --  823*  BILITOT 0.6 0.5 0.7  --  0.6  PROT 6.7 6.3* 6.0*  --   6.2*  ALBUMIN  3.1* 3.0* 2.8* 2.8* 2.8*    CBG: Recent Labs  Lab 01/07/24 0428 01/07/24 0722 01/07/24 1117  01/07/24 1430 01/07/24 1602  GLUCAP 126* 122* 116* 130* 131*     No results found for this or any previous visit (from the past 240 hours).       Radiology Studies: DG Abd Portable 1V Result Date: 01/09/2024 CLINICAL DATA:  Vomiting for the past 2 days. EXAM: PORTABLE ABDOMEN - 1 VIEW COMPARISON:  01/08/2024 FINDINGS: Normal bowel-gas pattern. Mild-to-moderate lumbar spine degenerative changes. Postsurgical changes in the left mid and upper abdomen. IMPRESSION: No acute abnormality. Electronically Signed   By: Elspeth Bathe M.D.   On: 01/09/2024 10:18   DG Abd Portable 1V Result Date: 01/08/2024 CLINICAL DATA:  Ileus EXAM: PORTABLE ABDOMEN - 1 VIEW COMPARISON:  Upper GI 01/06/2024.  CT 12/29/2023 FINDINGS: Gas is seen in nondilated loops of large bowel. Scattered stool. Air towards the rectum. Minimal small bowel gas. Nonspecific. No obstruction. No obvious free air on this portable supine radiograph. Of note the diaphragm is clipped off the edge of the film. Degenerative changes along the spine. IMPRESSION: Nonspecific bowel gas pattern.  Overall minimal small bowel gas. Electronically Signed   By: Ranell Bring M.D.   On: 01/08/2024 10:27         Scheduled Meds:  Chlorhexidine  Gluconate Cloth  6 each Topical Daily   methocarbamol  (ROBAXIN ) injection  500 mg Intravenous Q8H   pantoprazole  (PROTONIX ) IV  40 mg Intravenous Q12H   sodium chloride  flush  10-40 mL Intracatheter Q12H   Vitamin D  (Ergocalciferol )  50,000 Units Oral Q7 days   Continuous Infusions:  sodium chloride  125 mL/hr at 01/09/24 0856   TPN ADULT (ION) 90 mL/hr at 01/09/24 0606   TPN ADULT (ION)       LOS: 17 days    Time spent: 40 minutes    Toribio Hummer, MD [facility]   To contact the attending provider between 7A-7P or the covering provider during after hours 7P-7A, please log  into the web site [email or web address] and access using universal [place] password for that web site. If you do not have the password, please call the hospital operator.  01/09/2024, 12:19 PM

## 2024-01-09 NOTE — Progress Notes (Signed)
 PHARMACY - TOTAL PARENTERAL NUTRITION CONSULT NOTE   Indication: Pancreatic mass with duodenal stricture/obstruction  Patient Measurements: Height: 5' 11 (180.3 cm) Weight: 81.9 kg (180 lb 8.9 oz) IBW/kg (Calculated) : 70.8 TPN AdjBW (KG): 69.6 Body mass index is 25.18 kg/m. Usual Weight: 92.5 kg in April of this year  Assessment:  Pharmacy consulted to manage TPN for this 53 yo needing nutritional support while awaiting surgical intervention for pancreatic mass with duodenal stricture/obstruction in the setting of metastatic breast cancer resulting in poor oral intake, persistent nausea and vomiting, and weight loss.  7/22 Recent repeated episodes of large volume emesis. Patient reportedly agreeable to plan for gastrojejunostomy tube for drainage of the stomach and downstream tube feeds and now NPO for surgery.  Glucose / Insulin :  no Hx DM. BG goal <180 -BG remains at goal, CBGs and SSI previously [facility] Electrolytes: all WNL today including corrected Ca Renal: SCr and BUN WNL  Hepatic: albumin  low but stable at 2.8; alk phos elevated and rising 823; transaminases and bilirubin remain WNL; triglycerides still elevated at 274 but remaining below 300.  Intake / Output:  TPN at 90 mL/hr, no mIVF NPO since midnight Emesis 1500 ml LBM charted: 7/20 GI Imaging: 7/4 MR abdomen MRCP: pancreatic mass, vertebral metastases, pancolonic diverticulosis 7/10 CT: pancreatic head equivocal fullness, small amount free pelvic fluid, widespread osseous metastatic disease, small hiatal hernia, colonic diverticulosis GI Surgeries / Procedures:  7/8 EGD 7/7 EUS, upper GI tract 7/14: bypass surgery for creation of G-Junostomy  Central access: PICC placed 7/9 TPN start date: 7/9  Nutritional Goals:  Goal TPN rate is 90 mL/hr (provides 104 g of protein and 2238 kcals per day)  RD Assessment: Estimated Needs Total Energy Estimated Needs: 2000-2300kcal/day Total Protein Estimated Needs:  100-115g/day Total Fluid Estimated Needs: 2.1-2.4L/day  Current Nutrition:  -TPN at 90 mL/hr  Plan: Continue TPN with lipids at goal rate of 90 mL/hr at 1800 Electrolytes in TPN:  Na 150 mEq/L (max) K 40 mEq/L Ca 10 mEq/L (max)  Mg 8 mEq/L Phos 25 mmol/L Cl:Ac 1:1 Add standard MVI and trace elements to TPN  SSI & CBGs discontinued on 7/19 Monitor TPN labs on Mon/Thurs, and PRN Await further word on plans for surgery today   Thank you for allowing pharmacy to be a part of this patient's care.  Darina Garret, PharmD, BCPS Clinical Pharmacist 01/09/2024  7:28 AM

## 2024-01-10 ENCOUNTER — Other Ambulatory Visit: Payer: Self-pay

## 2024-01-10 ENCOUNTER — Inpatient Hospital Stay: Payer: MEDICAID | Admitting: Nurse Practitioner

## 2024-01-10 ENCOUNTER — Encounter (HOSPITAL_COMMUNITY)
Admission: EM | Discharge status: Against Medical Advice | Payer: Self-pay | Source: Ambulatory Visit | Attending: Internal Medicine

## 2024-01-10 ENCOUNTER — Ambulatory Visit: Payer: MEDICAID | Admitting: Physician Assistant

## 2024-01-10 ENCOUNTER — Telehealth: Payer: Self-pay

## 2024-01-10 ENCOUNTER — Encounter (HOSPITAL_COMMUNITY): Payer: Self-pay | Admitting: Family Medicine

## 2024-01-10 ENCOUNTER — Inpatient Hospital Stay (HOSPITAL_COMMUNITY): Payer: MEDICAID | Admitting: Anesthesiology

## 2024-01-10 DIAGNOSIS — I10 Essential (primary) hypertension: Secondary | ICD-10-CM | POA: Diagnosis not present

## 2024-01-10 DIAGNOSIS — K315 Obstruction of duodenum: Secondary | ICD-10-CM | POA: Diagnosis not present

## 2024-01-10 DIAGNOSIS — R1110 Vomiting, unspecified: Secondary | ICD-10-CM

## 2024-01-10 DIAGNOSIS — E785 Hyperlipidemia, unspecified: Secondary | ICD-10-CM | POA: Diagnosis not present

## 2024-01-10 DIAGNOSIS — N179 Acute kidney failure, unspecified: Secondary | ICD-10-CM | POA: Diagnosis not present

## 2024-01-10 DIAGNOSIS — B3781 Candidal esophagitis: Secondary | ICD-10-CM | POA: Diagnosis not present

## 2024-01-10 DIAGNOSIS — D649 Anemia, unspecified: Secondary | ICD-10-CM

## 2024-01-10 DIAGNOSIS — F418 Other specified anxiety disorders: Secondary | ICD-10-CM | POA: Diagnosis not present

## 2024-01-10 DIAGNOSIS — E876 Hypokalemia: Secondary | ICD-10-CM | POA: Diagnosis not present

## 2024-01-10 HISTORY — PX: LAPAROSCOPIC INSERTION GASTROSTOMY TUBE: SHX6817

## 2024-01-10 LAB — CBC
HCT: 27.7 % — ABNORMAL LOW (ref 36.0–46.0)
Hemoglobin: 8.6 g/dL — ABNORMAL LOW (ref 12.0–15.0)
MCH: 23.6 pg — ABNORMAL LOW (ref 26.0–34.0)
MCHC: 31 g/dL (ref 30.0–36.0)
MCV: 76.1 fL — ABNORMAL LOW (ref 80.0–100.0)
Platelets: 179 K/uL (ref 150–400)
RBC: 3.64 MIL/uL — ABNORMAL LOW (ref 3.87–5.11)
RDW: 17.5 % — ABNORMAL HIGH (ref 11.5–15.5)
WBC: 4.9 K/uL (ref 4.0–10.5)
nRBC: 0.8 % — ABNORMAL HIGH (ref 0.0–0.2)

## 2024-01-10 LAB — CBC WITH DIFFERENTIAL/PLATELET
Abs Immature Granulocytes: UNDETERMINED K/uL (ref 0.00–0.07)
Band Neutrophils: UNDETERMINED %
Basophils Absolute: UNDETERMINED K/uL (ref 0.0–0.1)
Basophils Relative: UNDETERMINED %
Blasts: UNDETERMINED %
Eosinophils Absolute: UNDETERMINED K/uL (ref 0.0–0.5)
Eosinophils Relative: UNDETERMINED %
HCT: 20.7 % — ABNORMAL LOW (ref 36.0–46.0)
Hemoglobin: 6.1 g/dL — CL (ref 12.0–15.0)
Immature Granulocytes: UNDETERMINED %
Lymphocytes Relative: UNDETERMINED %
Lymphs Abs: UNDETERMINED K/uL (ref 0.7–4.0)
MCH: 22.9 pg — ABNORMAL LOW (ref 26.0–34.0)
MCHC: 29.5 g/dL — ABNORMAL LOW (ref 30.0–36.0)
MCV: 77.8 fL — ABNORMAL LOW (ref 80.0–100.0)
Metamyelocytes Relative: UNDETERMINED %
Monocytes Absolute: UNDETERMINED K/uL (ref 0.1–1.0)
Monocytes Relative: UNDETERMINED %
Myelocytes: UNDETERMINED %
Neutro Abs: UNDETERMINED K/uL (ref 1.7–7.7)
Neutrophils Relative %: UNDETERMINED %
Other: UNDETERMINED %
Platelets: 127 K/uL — ABNORMAL LOW (ref 150–400)
Promyelocytes Relative: UNDETERMINED %
RBC Morphology: UNDETERMINED
RBC: 2.66 MIL/uL — ABNORMAL LOW (ref 3.87–5.11)
RDW: 17.5 % — ABNORMAL HIGH (ref 11.5–15.5)
Smear Review: UNDETERMINED
WBC Morphology: UNDETERMINED
WBC: 2.8 K/uL — ABNORMAL LOW (ref 4.0–10.5)
nRBC: 1.1 % — ABNORMAL HIGH (ref 0.0–0.2)
nRBC: UNDETERMINED /100{WBCs}

## 2024-01-10 LAB — BASIC METABOLIC PANEL WITH GFR
Anion gap: 11 (ref 5–15)
BUN: 12 mg/dL (ref 6–20)
CO2: 18 mmol/L — ABNORMAL LOW (ref 22–32)
Calcium: 6.6 mg/dL — ABNORMAL LOW (ref 8.9–10.3)
Chloride: 107 mmol/L (ref 98–111)
Creatinine, Ser: 0.34 mg/dL — ABNORMAL LOW (ref 0.44–1.00)
GFR, Estimated: >60 mL/min (ref >60)
Glucose, Bld: 99 mg/dL (ref 70–99)
Potassium: 3.7 mmol/L (ref 3.5–5.1)
Sodium: 136 mmol/L (ref 135–145)

## 2024-01-10 LAB — GLUCOSE, CAPILLARY
Glucose-Capillary: 103 mg/dL — ABNORMAL HIGH (ref 70–99)
Glucose-Capillary: 134 mg/dL — ABNORMAL HIGH (ref 70–99)
Glucose-Capillary: 161 mg/dL — ABNORMAL HIGH (ref 70–99)

## 2024-01-10 LAB — MAGNESIUM: Magnesium: 1.4 mg/dL — ABNORMAL LOW (ref 1.7–2.4)

## 2024-01-10 SURGERY — INSERTION, GASTROSTOMY TUBE, PERCUTANEOUS
Anesthesia: General

## 2024-01-10 MED ORDER — MIDAZOLAM HCL 2 MG/2ML IJ SOLN
INTRAMUSCULAR | Status: AC
  Filled 2024-01-10: qty 2

## 2024-01-10 MED ORDER — BUPIVACAINE-EPINEPHRINE (PF) 0.25% -1:200000 IJ SOLN
INTRAMUSCULAR | Status: AC
  Filled 2024-01-10: qty 30

## 2024-01-10 MED ORDER — OSMOLITE 1.5 CAL PO LIQD
1000.0000 mL | ORAL | Status: DC
  Administered 2024-01-10 – 2024-01-12 (×2): 1000 mL via JEJUNOSTOMY
  Filled 2024-01-10 (×5): qty 1000

## 2024-01-10 MED ORDER — 0.9 % SODIUM CHLORIDE (POUR BTL) OPTIME
TOPICAL | Status: DC | PRN
  Administered 2024-01-10: 1000 mL

## 2024-01-10 MED ORDER — BUPIVACAINE-EPINEPHRINE 0.25% -1:200000 IJ SOLN
INTRAMUSCULAR | Status: DC | PRN
  Administered 2024-01-10: 30 mL

## 2024-01-10 MED ORDER — PROSOURCE TF20 ENFIT COMPATIBL EN LIQD: 60.0000 mL | Freq: Every day | ENTERAL | Status: DC

## 2024-01-10 MED ORDER — LIDOCAINE HCL (CARDIAC) PF 100 MG/5ML IV SOSY
PREFILLED_SYRINGE | INTRAVENOUS | Status: DC | PRN
  Administered 2024-01-10: 60 mg via INTRAVENOUS

## 2024-01-10 MED ORDER — FREE WATER
100.0000 mL | Freq: Four times a day (QID) | Status: DC
  Administered 2024-01-11 – 2024-01-14 (×13): 100 mL

## 2024-01-10 MED ORDER — MIDAZOLAM HCL 5 MG/5ML IJ SOLN
INTRAMUSCULAR | Status: DC | PRN
  Administered 2024-01-10: 2 mg via INTRAVENOUS

## 2024-01-10 MED ORDER — AMISULPRIDE (ANTIEMETIC) 5 MG/2ML IV SOLN: 10.0000 mg | Freq: Once | INTRAVENOUS | Status: DC | PRN

## 2024-01-10 MED ORDER — DEXAMETHASONE SODIUM PHOSPHATE 10 MG/ML IJ SOLN
INTRAMUSCULAR | Status: DC | PRN
  Administered 2024-01-10: 10 mg via INTRAVENOUS

## 2024-01-10 MED ORDER — ACETAMINOPHEN 160 MG/5ML PO SOLN
650.0000 mg | Freq: Four times a day (QID) | ORAL | Status: DC | PRN
  Administered 2024-01-11: 650 mg via JEJUNOSTOMY
  Filled 2024-01-10: qty 20.3

## 2024-01-10 MED ORDER — FENTANYL CITRATE (PF) 100 MCG/2ML IJ SOLN
INTRAMUSCULAR | Status: AC
  Filled 2024-01-10: qty 2

## 2024-01-10 MED ORDER — OXYCODONE HCL 5 MG/5ML PO SOLN
10.0000 mg | ORAL | Status: DC | PRN
  Administered 2024-01-10: 10 mg via JEJUNOSTOMY
  Filled 2024-01-10: qty 10

## 2024-01-10 MED ORDER — ONDANSETRON HCL 4 MG/2ML IJ SOLN
INTRAMUSCULAR | Status: AC
  Filled 2024-01-10: qty 2

## 2024-01-10 MED ORDER — ACETAMINOPHEN 160 MG/5ML PO SOLN
650.0000 mg | Freq: Four times a day (QID) | ORAL | Status: DC
  Administered 2024-01-10 – 2024-01-11 (×3): 650 mg
  Filled 2024-01-10 (×4): qty 20.3

## 2024-01-10 MED ORDER — CHLORHEXIDINE GLUCONATE 0.12 % MT SOLN
15.0000 mL | Freq: Once | OROMUCOSAL | Status: AC
  Administered 2024-01-10: 15 mL via OROMUCOSAL

## 2024-01-10 MED ORDER — PHENYLEPHRINE 80 MCG/ML (10ML) SYRINGE FOR IV PUSH (FOR BLOOD PRESSURE SUPPORT)
PREFILLED_SYRINGE | INTRAVENOUS | Status: DC | PRN
  Administered 2024-01-10 (×2): 240 ug via INTRAVENOUS
  Administered 2024-01-10: 160 ug via INTRAVENOUS
  Administered 2024-01-10 (×2): 240 ug via INTRAVENOUS
  Administered 2024-01-10: 160 ug via INTRAVENOUS

## 2024-01-10 MED ORDER — OXYCODONE HCL 5 MG/5ML PO SOLN: 5.0000 mg | Freq: Once | ORAL | Status: DC | PRN

## 2024-01-10 MED ORDER — ORAL CARE MOUTH RINSE: 15.0000 mL | Freq: Once | OROMUCOSAL | Status: AC

## 2024-01-10 MED ORDER — PROSOURCE TF20 ENFIT COMPATIBL EN LIQD
60.0000 mL | Freq: Every day | ENTERAL | Status: DC
  Administered 2024-01-11 – 2024-01-14 (×4): 60 mL
  Filled 2024-01-10 (×7): qty 60

## 2024-01-10 MED ORDER — MAGNESIUM SULFATE 4 GM/100ML IV SOLN
4.0000 g | Freq: Once | INTRAVENOUS | Status: AC
  Administered 2024-01-10: 4 g via INTRAVENOUS
  Filled 2024-01-10: qty 100

## 2024-01-10 MED ORDER — LACTATED RINGERS IV SOLN: INTRAVENOUS | Status: DC | PRN

## 2024-01-10 MED ORDER — PROPOFOL 10 MG/ML IV BOLUS
INTRAVENOUS | Status: AC
Start: 2024-01-10 — End: 2024-01-10
  Filled 2024-01-10: qty 20

## 2024-01-10 MED ORDER — ACETAMINOPHEN 160 MG/5ML PO SOLN: 650.0000 mg | Freq: Four times a day (QID) | ORAL | Status: DC | PRN

## 2024-01-10 MED ORDER — PHENYLEPHRINE HCL-NACL 20-0.9 MG/250ML-% IV SOLN
INTRAVENOUS | Status: DC | PRN
  Administered 2024-01-10: 50 ug/min via INTRAVENOUS

## 2024-01-10 MED ORDER — ROCURONIUM BROMIDE 100 MG/10ML IV SOLN
INTRAVENOUS | Status: DC | PRN
  Administered 2024-01-10: 60 mg via INTRAVENOUS
  Administered 2024-01-10: 5 mg via INTRAVENOUS

## 2024-01-10 MED ORDER — OXYCODONE HCL 5 MG PO TABS: 5.0000 mg | ORAL_TABLET | Freq: Once | ORAL | Status: DC | PRN

## 2024-01-10 MED ORDER — ACETAMINOPHEN 160 MG/5ML PO SOLN
650.0000 mg | Freq: Four times a day (QID) | ORAL | Status: DC
  Administered 2024-01-10: 650 mg
  Filled 2024-01-10: qty 20.3

## 2024-01-10 MED ORDER — OXYCODONE HCL 5 MG PO TABS: 5.0000 mg | ORAL_TABLET | ORAL | Status: DC | PRN

## 2024-01-10 MED ORDER — SUGAMMADEX SODIUM 200 MG/2ML IV SOLN
INTRAVENOUS | Status: AC
  Filled 2024-01-10: qty 2

## 2024-01-10 MED ORDER — ALBUMIN HUMAN 5 % IV SOLN
INTRAVENOUS | Status: AC
  Filled 2024-01-10: qty 250

## 2024-01-10 MED ORDER — ALBUMIN HUMAN 5 % IV SOLN: INTRAVENOUS | Status: DC | PRN

## 2024-01-10 MED ORDER — SUGAMMADEX SODIUM 200 MG/2ML IV SOLN
INTRAVENOUS | Status: DC | PRN
  Administered 2024-01-10: 200 mg via INTRAVENOUS
  Administered 2024-01-10: 100 mg via INTRAVENOUS

## 2024-01-10 MED ORDER — ONDANSETRON HCL 4 MG/2ML IJ SOLN
INTRAMUSCULAR | Status: DC | PRN
  Administered 2024-01-10: 4 mg via INTRAVENOUS

## 2024-01-10 MED ORDER — DEXAMETHASONE SODIUM PHOSPHATE 10 MG/ML IJ SOLN
INTRAMUSCULAR | Status: AC
Start: 2024-01-10 — End: 2024-01-10
  Filled 2024-01-10: qty 1

## 2024-01-10 MED ORDER — TRAVASOL 10 % IV SOLN
INTRAVENOUS | Status: AC
  Filled 2024-01-10: qty 518.4

## 2024-01-10 MED ORDER — OXYCODONE HCL 5 MG/5ML PO SOLN: 10.0000 mg | ORAL | Status: DC | PRN

## 2024-01-10 MED ORDER — FENTANYL CITRATE PF 50 MCG/ML IJ SOSY
25.0000 ug | PREFILLED_SYRINGE | INTRAMUSCULAR | Status: DC | PRN
  Administered 2024-01-10: 50 ug via INTRAVENOUS

## 2024-01-10 MED ORDER — LACTATED RINGERS IV SOLN: INTRAVENOUS | Status: DC

## 2024-01-10 MED ORDER — ROCURONIUM BROMIDE 10 MG/ML (PF) SYRINGE
PREFILLED_SYRINGE | INTRAVENOUS | Status: AC
  Filled 2024-01-10: qty 10

## 2024-01-10 MED ORDER — FENTANYL CITRATE (PF) 100 MCG/2ML IJ SOLN
INTRAMUSCULAR | Status: DC | PRN
  Administered 2024-01-10: 100 ug via INTRAVENOUS
  Administered 2024-01-10 (×2): 50 ug via INTRAVENOUS

## 2024-01-10 MED ORDER — SODIUM CHLORIDE 0.9% IV SOLUTION: Freq: Once | INTRAVENOUS | Status: DC

## 2024-01-10 MED ORDER — PHENYLEPHRINE 80 MCG/ML (10ML) SYRINGE FOR IV PUSH (FOR BLOOD PRESSURE SUPPORT)
PREFILLED_SYRINGE | INTRAVENOUS | Status: AC
  Filled 2024-01-10: qty 20

## 2024-01-10 MED ORDER — ACETAMINOPHEN 160 MG/5ML PO SOLN: 650.0000 mg | Freq: Four times a day (QID) | ORAL | Status: DC

## 2024-01-10 MED ORDER — TRAVASOL 10 % IV SOLN
INTRAVENOUS | Status: DC
  Filled 2024-01-10: qty 1036.8

## 2024-01-10 MED ORDER — PROPOFOL 10 MG/ML IV BOLUS
INTRAVENOUS | Status: DC | PRN
  Administered 2024-01-10: 150 mg via INTRAVENOUS

## 2024-01-10 MED ORDER — SODIUM CHLORIDE 0.9 % IV SOLN: 12.5000 mg | INTRAVENOUS | Status: DC | PRN

## 2024-01-10 MED ORDER — OXYCODONE HCL 5 MG/5ML PO SOLN
10.0000 mg | ORAL | Status: DC | PRN
  Administered 2024-01-10 – 2024-01-17 (×12): 10 mg via JEJUNOSTOMY
  Filled 2024-01-10 (×13): qty 10

## 2024-01-10 MED ORDER — FENTANYL CITRATE PF 50 MCG/ML IJ SOSY
PREFILLED_SYRINGE | INTRAMUSCULAR | Status: AC
  Filled 2024-01-10: qty 2

## 2024-01-10 MED ORDER — LIDOCAINE HCL (PF) 2 % IJ SOLN
INTRAMUSCULAR | Status: AC
  Filled 2024-01-10: qty 5

## 2024-01-10 MED ORDER — HYDROMORPHONE HCL 1 MG/ML IJ SOLN
0.5000 mg | INTRAMUSCULAR | Status: DC | PRN
  Administered 2024-01-13 – 2024-01-14 (×4): 0.5 mg via INTRAVENOUS
  Filled 2024-01-10 (×5): qty 0.5

## 2024-01-10 MED ORDER — POTASSIUM CHLORIDE 10 MEQ/100ML IV SOLN
10.0000 meq | INTRAVENOUS | Status: AC
  Administered 2024-01-10 (×4): 10 meq via INTRAVENOUS
  Filled 2024-01-10: qty 100

## 2024-01-10 SURGICAL SUPPLY — 39 items
BAG COUNTER SPONGE SURGICOUNT (BAG) IMPLANT
BENZOIN TINCTURE PRP APPL 2/3 (GAUZE/BANDAGES/DRESSINGS) IMPLANT
BNDG ADH 1X3 SHEER STRL LF (GAUZE/BANDAGES/DRESSINGS) ×4 IMPLANT
CABLE HIGH FREQUENCY MONO STRZ (ELECTRODE) IMPLANT
CHLORAPREP W/TINT 26 (MISCELLANEOUS) ×2 IMPLANT
COVER SURGICAL LIGHT HANDLE (MISCELLANEOUS) ×2 IMPLANT
DERMABOND ADVANCED .7 DNX12 (GAUZE/BANDAGES/DRESSINGS) IMPLANT
DEVICE SUTURE ENDOST 10MM (ENDOMECHANICALS) IMPLANT
ELECT PENCIL ROCKER SW 15FT (MISCELLANEOUS) ×2 IMPLANT
G-TUBE MIC 18FR ENFIT ADLT (TUBING) IMPLANT
G-TUBE MIC BOLUS 22FR ENFIT (TUBING) IMPLANT
G-TUBE MIC BOLUS 24FR ENFIT (CATHETERS) IMPLANT
GJ-TUBE MIC-KEY 22FX45 ENFIT (CATHETERS) IMPLANT
GLOVE BIOGEL PI IND STRL 7.0 (GLOVE) ×2 IMPLANT
GLOVE SURG SS PI 7.0 STRL IVOR (GLOVE) ×2 IMPLANT
GOWN STRL REUS W/ TWL LRG LVL3 (GOWN DISPOSABLE) ×2 IMPLANT
GRASPER SUT TROCAR 14GX15 (MISCELLANEOUS) ×2 IMPLANT
IRRIGATION SUCT STRKRFLW 2 WTP (MISCELLANEOUS) IMPLANT
KIT BASIN OR (CUSTOM PROCEDURE TRAY) ×2 IMPLANT
KIT INTRO ENDO 22F F/GAST TUBE (SET/KITS/TRAYS/PACK) IMPLANT
KIT TURNOVER KIT A (KITS) ×2 IMPLANT
LHOOK LAP DISP 36CM (ELECTROSURGICAL) IMPLANT
LUBRICANT JELLY K Y 4OZ (MISCELLANEOUS) IMPLANT
RELOAD SUT SNGL STCH BLK 2-0 (ENDOMECHANICALS) IMPLANT
SCISSORS LAP 5X35 DISP (ENDOMECHANICALS) IMPLANT
SET IRRIG Y TYPE TUR BLADDER L (SET/KITS/TRAYS/PACK) IMPLANT
SET TUBE SMOKE EVAC HIGH FLOW (TUBING) ×2 IMPLANT
SLEEVE Z-THREAD 5X100MM (TROCAR) ×2 IMPLANT
SPIKE FLUID TRANSFER (MISCELLANEOUS) ×2 IMPLANT
STRIP CLOSURE SKIN 1/2X4 (GAUZE/BANDAGES/DRESSINGS) IMPLANT
SUT ETHILON 2 0 PS N (SUTURE) IMPLANT
SUT MNCRL AB 4-0 PS2 18 (SUTURE) ×2 IMPLANT
SUT SILK 2 0 SH (SUTURE) ×6 IMPLANT
SUT SILK 3 0 SH 30 (SUTURE) ×10 IMPLANT
TOWEL OR 17X26 10 PK STRL BLUE (TOWEL DISPOSABLE) ×2 IMPLANT
TRAY LAPAROSCOPIC (CUSTOM PROCEDURE TRAY) ×2 IMPLANT
TROCAR 11X100 Z THREAD (TROCAR) IMPLANT
TROCAR KII 8X100ML NONTHREADED (TROCAR) ×2 IMPLANT
TROCAR Z-THREAD OPTICAL 5X100M (TROCAR) ×2 IMPLANT

## 2024-01-10 NOTE — Progress Notes (Signed)
 VAST consult received to obtain labs from PICC and re-hang TPN now that patient has returned from surgery. Educated unit RN that TPN cannot be restarted once disconnected. Asked unit RN to contact pharmacy and determine if D10 at rate of 82mLs/hr is needed. New TPN will be hung at 1800 this evening; rate has been decreased to 81mLs/hr. Suggested unit RN check CBG as there is not one recorded since TPN was turned off this am at 0826.

## 2024-01-10 NOTE — Plan of Care (Signed)

## 2024-01-10 NOTE — Transfer of Care (Signed)
 Immediate Anesthesia Transfer of Care Note  Patient: Chelsea Hansen  Procedure(s) Performed: laparoscopic insertion of gastrostomy tube, laparoscopic insertion of jejunostomy tube, upper endoscopy  Patient Location: PACU  Anesthesia Type:General  Level of Consciousness: awake and alert   Airway & Oxygen Therapy: Patient Spontanous Breathing and Patient connected to face mask oxygen  Post-op Assessment: Report given to RN and Post -op Vital signs reviewed and stable  Post vital signs: Reviewed and stable  Last Vitals:  Vitals Value Taken Time  BP 146/100 01/10/24 12:32  Temp    Pulse 96 01/10/24 12:35  Resp 22 01/10/24 12:35  SpO2 100 % 01/10/24 12:35  Vitals shown include unfiled device data.  Last Pain:  Vitals:   01/10/24 0855  TempSrc:   PainSc: 0-No pain      Patients Stated Pain Goal: 4 (01/10/24 0855)  Complications: No notable events documented.

## 2024-01-10 NOTE — Plan of Care (Signed)
  Problem: Education: Goal: Knowledge of General Education information will improve Description: Including pain rating scale, medication(s)/side effects and non-pharmacologic comfort measures 01/10/2024 0832 by Alaina Dozier PARAS, RN Outcome: Progressing 01/10/2024 0828 by Alaina Dozier PARAS, RN Outcome: Progressing   Problem: Health Behavior/Discharge Planning: Goal: Ability to manage health-related needs will improve 01/10/2024 0832 by Alaina Dozier PARAS, RN Outcome: Progressing 01/10/2024 0828 by Alaina Dozier PARAS, RN Outcome: Progressing   Problem: Clinical Measurements: Goal: Ability to maintain clinical measurements within normal limits will improve 01/10/2024 0832 by Alaina Dozier PARAS, RN Outcome: Progressing 01/10/2024 0828 by Alaina Dozier PARAS, RN Outcome: Progressing Goal: Will remain free from infection 01/10/2024 0832 by Alaina Dozier PARAS, RN Outcome: Progressing 01/10/2024 0828 by Alaina Dozier PARAS, RN Outcome: Progressing

## 2024-01-10 NOTE — Anesthesia Postprocedure Evaluation (Signed)
 Anesthesia Post Note  Patient: Chelsea Hansen  Procedure(s) Performed: laparoscopic insertion of gastrostomy tube, laparoscopic insertion of jejunostomy tube, upper endoscopy     Patient location during evaluation: PACU Anesthesia Type: General Level of consciousness: awake and alert Pain management: pain level controlled Vital Signs Assessment: post-procedure vital signs reviewed and stable Respiratory status: spontaneous breathing, nonlabored ventilation and respiratory function stable Cardiovascular status: stable and blood pressure returned to baseline Anesthetic complications: no   No notable events documented.  Last Vitals:  Vitals:   01/10/24 1317 01/10/24 1356  BP: 134/87 133/87  Pulse: 84 92  Resp: 17   Temp:    SpO2: 99% 100%    Last Pain:  Vitals:   01/10/24 1315  TempSrc:   PainSc: 4                  Debby FORBES Like

## 2024-01-10 NOTE — Plan of Care (Signed)

## 2024-01-10 NOTE — Progress Notes (Addendum)
 PROGRESS NOTE    Chelsea Hansen  FMW:[id number] DOB: March 11, 1971 DOA: 12/22/2023 PCP: [name], NP   Chief Complaint  Patient presents with   Abdominal Pain   Emesis    Brief Narrative:  53 y.o. 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   Imaging showed a duodenal obstruction and masslike area of the pancreas.  GI and General Surgery consulted.     EUS attempted on 7/7, but unsuccessful secondary to duodenal stenosis.     Repeat EGD/EUS on 7/9 again unsuccessful in biopsy.  Gen surg consulted and NG tube was placed, patient started on TPN, patient subsequently underwent laparoscopic creation of gastrojejunostomy for duodenal stricture.  Patient started on clear liquids and diet advanced to full liquid diet. -Patient with ongoing nausea vomiting and followed by general surgery. -Patient to undergo gastrojejunostomy tube tube insertion for drainage of the stomach and J-tube downstream for tube feeds today, 01/10/2024. - Per general surgery.     Assessment & Plan:   Principal Problem:   AKI (acute kidney injury) (HCC) Active Problems:   Hypokalemia   Acute kidney injury superimposed on chronic kidney disease (HCC)   Hypocalcemia   Breast cancer metastasized to bone, right (HCC)   HLD (hyperlipidemia)   Constipation   Dehydration   Prolonged QT interval   Acute esophagitis   Candida esophagitis (HCC)   Transaminitis   Pancreatic mass   Right sided weakness   Hypotension   Abnormal CT of the abdomen   Elevated liver enzymes   Acute kidney injury (HCC)   Abnormal MRI of abdomen   Pancreatic lesion   Unintentional weight loss   Nausea and vomiting   Duodenal obstruction   Hypophosphatemia   Hypomagnesemia  #1 pancreatic mass with duodenal stricture/SBO in the setting of pancreatic mass/??  Ileus -Patient noted to have a new mass noted on imaging of the  pancreas noted on CT abdomen and pelvis. - Patient underwent MRCP which was concerning for expansive, masslike appearance of the pancreatic head and uncinate as noted on prior CT measuring 2.5 x 2.3 cm which was noted to be new and interval to prior restaging PET/CT dated 06/10/2023 and highly concerning for pancreatic adenocarcinoma. -CA 19-9 noted at 465. - Patient seen in consultation by GI however unfortunately EUS which was attempted on 7 7 was unsuccessful secondary to duodenal stenosis with repeat EGD/EUS done on 7 /9 noted to be unsuccessful and unable to get a biopsy. - General Surgery was consulted and patient underwent a gastrojejunal bypass surgery on 7/14 with no concerning nodules or evidence of metastatic disease on diagnostic laparoscopy. - Patient currently on TPN with diet advancement per general surgery. - Patient noted to have tolerated full liquid diet on 01/03/2024 however around 8 PM noted to had episode of emesis. -Patient noted with emesis the morning of 01/05/2024 and has been having emesis at least twice daily with last episode of emesis early on this morning, 01/09/2024.    - Was on scheduled Reglan  which has been discontinued.   -Continue IV PPI. - continue Ensure, TPN as per general surgery. -Was on full liquid diet however currently n.p.o. in anticipation of procedure today per general surgery. - Discontinued fentanyl  PCA pump and placed on IV Dilaudid  as needed severe pain.  Patient also placed on as needed oxycodone . -Patient noted to have low blood pressure after receiving IV Dilaudid  on 01/07/2024  and as such IV Dilaudid  has been discontinued.   - Continue IV Toradol  as needed.  -Per general surgery upper GI on 7/18 without complication.   - Patient still with ongoing nausea and emesis with concerns for possible ileus.   - Keep potassium approximately 4, magnesium  approximately 2, mobilize.  - General Surgery following and have discussed with patient options of a  gastrojejunostomy tube for drainage of stomach and downstream tube for feeds which will be placed today per general surgery.   -There is also noted.  General surgery, that patient would likely need to wait 8 weeks and undergo EGD EUS for further evaluation of pancreatic mass. - Per general surgery.  2.  Hypertension -BP soft/borderline.   - Continue to hold home antihypertensive medications.  - IV fluids. - Patient to receive 2 units PRBCs today.  3.  Moderate protein calorie malnutrition - On TPN.   - Currently NPO.  - Patient for J-tube placement today.  4.  Candida esophagitis - Status post full course fluconazole .   5.  Chronic asthma -Stable.  6.  AKI on CKD stage 3a - Patient noted to have a creatinine of 3.2 on admission which improved with hydration and resolved, creatinine currently at 0.34.  7.  Hypophosphatemia/hypomagnesemia/hypomagnesemia -Being repleted per pharmacy as patient on TPN. - Potassium currently at 3.7.   - Phosphorus at 2.5.   - Magnesium  at 1.4.  -Magnesium  4 g IV x 1. -Repeat labs in the AM.  8.  Chronic anemia -Patient with no overt bleeding. - Hemoglobin at 6.1 from 8.5 from 7.4 after transfusion 1 unit PRBCs on 01/06/2024. -Patient with no overt bleeding. - Follow.  Addendum.  6:19 PM - Repeat CBC done with hemoglobin of 8.6. - Will follow H&H for now.  9.  Dizziness/lightheadedness/soft blood pressure/hypotension -Patient noted to have an episode of nausea and abdominal pain after drinking soup in the afternoon on 01/07/2024.  - Patient given IV Dilaudid , IV Compazine  and noted to have transient unresponsiveness of less than 1 minute and subsequently improved.  Repeat vitals at that time noted with initial blood pressure of 110/66 with blood pressure dropping down into the high 80s to low 90s. -Resolved. -Patient currently asymptomatic and back to baseline. - IV Dilaudid  discontinued.   - Continue IV fluids.   - Supportive care.    DVT  prophylaxis: Lovenox  Code Status: Full Family Communication: Updated patient.  No family at bedside. Disposition: TBD  Status is: Inpatient Remains inpatient appropriate because: Severity of illness   Consultants:  Gastroenterology: Dr. Albertus 12/23/2023 General Surgery: Dr. Vernetta 12/27/2023 Dr. Odean   Procedures:  CT abdomen and pelvis 12/29/2023 Abdominal films 12/24/2023, 12/28/2023, 12/30/2023 Chest x-ray 01/03/2024 PICC line placement MRCP 12/23/2023 Right upper quadrant ultrasound 12/22/2023 EGD/EUS 12/26/2023 by Dr. Wilhelmenia Laparoscopic creation gastrojejunostomy by general surgery: Dr. Lyndel 01/02/2024 Transfusion 1 unit PRBCs 01/06/2024. Upper GI series  Antimicrobials:  Anti-infectives (From admission, onward)    Start     Dose/Rate Route Frequency Ordered Stop   01/10/24 0600  [MAR Hold]  ceFAZolin  (ANCEF ) IVPB 2g/100 mL premix        (MAR Hold since Tue 01/10/2024 at 0831.Hold Reason: Transfer to a Procedural area)   2 g 200 mL/hr over 30 Minutes Intravenous On call to O.R. 01/09/24 1324 01/11/24 0559   01/02/24 1245  ceFAZolin  (ANCEF ) 2-4 GM/100ML-% IVPB       Note to Pharmacy: Kathern Quant: cabinet override      01/02/24 1245 01/03/24 0059  12/22/23 2230  fluconazole  (DIFLUCAN ) IVPB 200 mg  Status:  Discontinued        200 mg 100 mL/hr over 60 Minutes Intravenous Every 24 hours 12/22/23 2132 01/03/24 1126         Subjective: Patient in preop area.  Stated has been having multiple stools yesterday and today and noted to have asked RN for probable Imodium.  Patient denies any chest pain or shortness of breath.  States some improvement with abdominal pain.  Patient had an episode of emesis last night she states about 300 cc and early this morning about another 300 cc none since.  Patient awaiting surgical procedure today.   Objective: Vitals:   01/09/24 2019 01/10/24 0521 01/10/24 0843 01/10/24 0855  BP: 117/73 120/79 125/87   Pulse: 81 78 86   Resp:  16 16 16    Temp: 98.9 F (37.2 C) 98.5 F (36.9 C) 98.2 F (36.8 C)   TempSrc: Oral Oral Oral   SpO2: 100% 99% 100%   Weight:    81.9 kg  Height:    5' 11 (1.803 m)    Intake/Output Summary (Last 24 hours) at 01/10/2024 0940 Last data filed at 01/10/2024 0300 Gross per 24 hour  Intake 4044.92 ml  Output 1150 ml  Net 2894.92 ml   Filed Weights   01/08/24 0500 01/09/24 0500 01/10/24 0855  Weight: 82.6 kg 81.9 kg 81.9 kg    Examination:  General exam: NAD. Respiratory system: Lungs clear to auscultation bilaterally.  No wheezes, no crackles, no rhonchi.  Fair air movement.  Speaking in full sentences.   Cardiovascular system: RRR no murmurs rubs or gallops.  No JVD.  No pitting lower extremity edema.  Gastrointestinal system: Abdomen is soft, nondistended, some tenderness around incision sites and right lower quadrant.  Hypoactive bowel sounds.  No rebound.  No guarding. Central nervous system: Alert and oriented. No focal neurological deficits. Extremities: Symmetric 5 x 5 power. Skin: No rashes, lesions or ulcers Psychiatry: Judgement and insight appear normal. Mood & affect appropriate.     Data Reviewed: I have personally reviewed following labs and imaging studies  CBC: Recent Labs  Lab 01/03/24 1036 01/05/24 0256 01/06/24 0416 01/06/24 1959 01/07/24 0358 01/07/24 1742 01/08/24 0407 01/09/24 0346  WBC 6.4 5.2 4.8  --  4.2 4.8 4.4 4.4  NEUTROABS 4.1 2.6  --   --   --   --   --  2.3  HGB 7.9* 7.4* 7.6* 8.7* 8.0* 8.3* 7.7* 8.5*  HCT 26.1* 24.0* 24.7* 27.4* 25.5* 27.1* 24.8* 26.9*  MCV 75.2* 75.0* 75.5*  --  76.1* 76.8* 76.3* 76.4*  PLT 195 188 185  --  153 181 147* 182    Basic Metabolic Panel: Recent Labs  Lab 01/04/24 0351 01/05/24 0256 01/06/24 0416 01/07/24 0358 01/07/24 1742 01/08/24 0407 01/09/24 0346  NA 133* 135 134*  134* 134* 136 136 139  K 3.8 3.9 3.8  3.8 3.4* 4.1 3.6 4.1  CL 103 104 104  104 105 109 108 109  CO2 23 23 23  22 22 22   21* 22  GLUCOSE 119* 123* 141*  140* 127* 115* 123* 114*  BUN 12 16 19  19 18 15 13 14   CREATININE 0.56 0.69 0.57  0.56 0.47 0.46 0.39* 0.46  CALCIUM  8.0* 7.8* 7.8*  7.8* 7.7* 7.8* 7.7* 8.1*  MG 1.5* 2.2 2.2 2.1  --  2.0 2.1  PHOS 2.3* 3.6 3.4  3.4  --   --   --  2.5    GFR: Estimated Creatinine Clearance: 93 mL/min (by C-G formula based on SCr of 0.46 mg/dL).  Liver Function Tests: Recent Labs  Lab 01/04/24 0351 01/05/24 0256 01/06/24 0416 01/09/24 0346  AST 24 35  --  26  ALT 43 36  --  31  ALKPHOS 580* 673*  --  823*  BILITOT 0.5 0.7  --  0.6  PROT 6.3* 6.0*  --  6.2*  ALBUMIN  3.0* 2.8* 2.8* 2.8*    CBG: Recent Labs  Lab 01/07/24 0428 01/07/24 0722 01/07/24 1117 01/07/24 1430 01/07/24 1602  GLUCAP 126* 122* 116* 130* 131*     No results found for this or any previous visit (from the past 240 hours).       Radiology Studies: DG Abd Portable 1V Result Date: 01/09/2024 CLINICAL DATA:  Vomiting for the past 2 days. EXAM: PORTABLE ABDOMEN - 1 VIEW COMPARISON:  01/08/2024 FINDINGS: Normal bowel-gas pattern. Mild-to-moderate lumbar spine degenerative changes. Postsurgical changes in the left mid and upper abdomen. IMPRESSION: No acute abnormality. Electronically Signed   By: Elspeth Bathe M.D.   On: 01/09/2024 10:18         Scheduled Meds:  [MAR Hold] Chlorhexidine  Gluconate Cloth  6 each Topical Daily   [MAR Hold] methocarbamol  (ROBAXIN ) injection  500 mg Intravenous Q8H   [MAR Hold] pantoprazole  (PROTONIX ) IV  40 mg Intravenous Q12H   [MAR Hold] sodium chloride  flush  10-40 mL Intracatheter Q12H   [MAR Hold] Vitamin D  (Ergocalciferol )  50,000 Units Oral Q7 days   Continuous Infusions:  sodium chloride  125 mL/hr at 01/10/24 0230   [MAR Hold]  ceFAZolin  (ANCEF ) IV     lactated ringers      [MAR Hold] ondansetron  (ZOFRAN ) IV 8 mg (01/09/24 2342)   TPN ADULT (ION) Stopped (01/10/24 0857)   TPN ADULT (ION)       LOS: 18 days    Time spent: 40  minutes    Toribio Hummer, MD [facility]   To contact the attending provider between 7A-7P or the covering provider during after hours 7P-7A, please log into the web site [email or web address] and access using universal [place] password for that web site. If you do not have the password, please call the hospital operator.  01/10/2024, 9:40 AM

## 2024-01-10 NOTE — Anesthesia Preprocedure Evaluation (Addendum)
 Anesthesia Evaluation  Patient identified by MRN, date of birth, ID band Patient awake    Reviewed: Allergy & Precautions, NPO status , Patient's Chart, lab work & pertinent test results  History of Anesthesia Complications Negative for: history of anesthetic complications  Airway Mallampati: I  TM Distance: >3 FB Neck ROM: Full    Dental  (+) Dental Advisory Given, Chipped   Pulmonary asthma    Pulmonary exam normal        Cardiovascular hypertension, + DVT  Normal cardiovascular exam+ dysrhythmias Supra Ventricular Tachycardia      Neuro/Psych  Headaches PSYCHIATRIC DISORDERS Anxiety Depression       GI/Hepatic Neg liver ROS,GERD  Controlled,, Dysphagia S/p gastric bypass    Endo/Other  negative endocrine ROS    Renal/GU negative Renal ROS     Musculoskeletal  Bone metastases     Abdominal   Peds  Hematology  (+) Blood dyscrasia, anemia   Anesthesia Other Findings   Reproductive/Obstetrics  Breast cancer w/ mets s/p hysterectomy                               Anesthesia Physical Anesthesia Plan  ASA: 3  Anesthesia Plan: General   Post-op Pain Management: Ofirmev  IV (intra-op)*   Induction: Intravenous and Rapid sequence  PONV Risk Score and Plan: 3 and Treatment may vary due to age or medical condition, Ondansetron , Dexamethasone  and Midazolam   Airway Management Planned: Oral ETT  Additional Equipment: None  Intra-op Plan:   Post-operative Plan: Extubation in OR  Informed Consent: I have reviewed the patients History and Physical, chart, labs and discussed the procedure including the risks, benefits and alternatives for the proposed anesthesia with the patient or authorized representative who has indicated his/her understanding and acceptance.     Dental advisory given  Plan Discussed with: CRNA and Anesthesiologist  Anesthesia Plan Comments:           Anesthesia Quick Evaluation

## 2024-01-10 NOTE — Progress Notes (Signed)
 Nutrition Follow-up  DOCUMENTATION CODES:   Not applicable  INTERVENTION:  - Initiate continuous tube feeds via new J-tube. Plan for slow advancement for best tolerance. Osmolite 1.5 at 55 ml/h (1320 ml per day) *Start at 69mL/hr and advance by 10mL Q12H Prosource TF20 60 ml daily Provides 2060 kcal, 103 gm protein, 1006 ml free water  daily *Could consider advancing 10mL Q8H if patient tolerating TF's well  - Monitor magnesium , potassium, and phosphorus daily for 3 days, MD to replete as needed.  - Add 100mL FWF QID to better meet hydration needs. May need to increase if patient unable to tolerate anything PO.  - Diet per MD/Surgery.   - Once tolerating goal 24 hour TF's, can consider transitioning to 16 hour feeds to allow time off the pump: Osmolite 1.5 at 90 ml/hr (1600-0800) Provides 2160 kcal, 90 gm protein, 1097 ml free water  daily   NUTRITION DIAGNOSIS:   Unintentional weight loss related to chronic illness (Pancreatic mass with duodenal stricture/obstruction in the setting of metastatic breast cancer) as evidenced by percent weight loss (50# or 25% in <3 months). *ongoing  GOAL:   Patient will meet greater than or equal to 90% of their needs *progressing, starting TF's/on TPN  MONITOR:   PO intake, Supplement acceptance, Diet advancement, Labs, Weight trends  REASON FOR ASSESSMENT:   Consult Enteral/tube feeding initiation and management  ASSESSMENT:   53 y.o. 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  7/3: admitted, clear liquids 7/5:  FLD 7/6: CLD 7/7: NPO, s/p EUS 7/8: CLD, s/p EGD 7/9: TPN initiated 7/10: NPO 7/11: NGT placed for LIS 7/12: TPN advanced to goal 7/14: s/p gastrojejonostomy 7/15: FLD 7/22: s/p G-tube and J-tube placement   Patient is noted to have ongoing issues with nausea and vomiting. Minimal intake and suspect most of what patient does get down she vomits back up. Vomited 11 times yesterday per RN documentation in I&O's.  Patient had G-tube AND a J-tube placed today. Patient in OR at time of visit. Consulted by Surgery to start continuous J-tube feeds today and advance to goal as tolerated. Confirmed with Surgery PA-C plan a this time is for tube feeds to meet ~100% of estimated needs.   TPN to be cut in half tonight at 51mL/hr.     Admission weight: 169 lbs Current weight: 180 lbs I&O's: +16.6L since 7/8 + for non-pitting generalized and RLE/LLE edema   Medications: 50,000 units Vitamin D  weekly   Labs reviewed: -   Diet Order:   Diet Order             Diet NPO time specified Except for: Ice Chips  Diet effective now                   EDUCATION NEEDS:  Education needs have been addressed  Skin:  Skin Assessment: Reviewed RN Assessment  Last BM:  7/22  Height:  Ht Readings from Last 1 Encounters:  01/10/24 5' 11 (1.803 m)   Weight:  Wt Readings from Last 1 Encounters:  01/10/24 81.9 kg   Ideal Body Weight:  70 kg  BMI:  Body mass index is 25.18 kg/m.  Estimated Nutritional  Needs:  Kcal:  2000-2300kcal/day Protein:  100-115g/day Fluid:  >/= 2L    Trude Ned RD, LDN Contact via Secure Chat.

## 2024-01-10 NOTE — Progress Notes (Signed)
 Pre Procedure note for inpatients:   Chelsea Hansen has been scheduled for Procedure(s) with comments: INSERTION, GASTROSTOMY TUBE, PERCUTANEOUS (N/A) - Laproscopic insertion today. The various methods of treatment have been discussed with the patient. After consideration of the risks, benefits and treatment options the patient has consented to the planned procedure.   The patient has been seen and labs reviewed. There are no changes in the patient's condition to prevent proceeding with the planned procedure today.  Recent labs:  Lab Results  Component Value Date   WBC 4.4 01/09/2024   HGB 8.5 (L) 01/09/2024   HCT 26.9 (L) 01/09/2024   PLT 182 01/09/2024   GLUCOSE 114 (H) 01/09/2024   CHOL 226 (H) 10/06/2020   TRIG 274 (H) 01/09/2024   HDL 51 10/06/2020   LDLCALC 140 (H) 10/06/2020   ALT 31 01/09/2024   AST 26 01/09/2024   NA 139 01/09/2024   K 4.1 01/09/2024   CL 109 01/09/2024   CREATININE 0.46 01/09/2024   BUN 14 01/09/2024   CO2 22 01/09/2024   TSH 2.065 12/22/2023   INR 1.2 12/22/2023    Herlene Beverley Bureau, MD 01/10/2024 9:05 AM

## 2024-01-10 NOTE — Progress Notes (Addendum)
 PHARMACY - TOTAL PARENTERAL NUTRITION CONSULT NOTE   Indication: Pancreatic mass with duodenal stricture/obstruction  Patient Measurements: Height: 5' 11 (180.3 cm) Weight: 81.9 kg (180 lb 8.9 oz) IBW/kg (Calculated) : 70.8 TPN AdjBW (KG): 69.6 Body mass index is 25.18 kg/m. Usual Weight: 92.5 kg in April of this year  Assessment:  Pharmacy consulted to manage TPN for this 53 yo needing nutritional support while awaiting surgical intervention for pancreatic mass with duodenal stricture/obstruction in the setting of metastatic breast cancer resulting in poor oral intake, persistent nausea and vomiting, and weight loss.  7/22 Recent repeated episodes of large volume emesis. Patient reportedly agreeable to plan for gastrojejunostomy tube for drainage of the stomach and downstream tube feeds and now NPO for surgery.  Glucose / Insulin : No Hx DM. BG remains < 150. CBGs and SSI have been stopped. Last AM glucose 114. Electrolytes: all WNL today including corrected Ca. Phos borderline low. Renal: SCr and BUN WNL  Hepatic: albumin  low but stable at 2.8; alk phos elevated and rising 823; transaminases and bilirubin remain WNL; triglycerides elevated at 274.  Intake / Output:  NS @ 173ml/hr. NG output trending down.  GI Imaging: 7/4 MR abdomen MRCP: pancreatic mass, vertebral metastases, pancolonic diverticulosis 7/10 CT: pancreatic head equivocal fullness, small amount free pelvic fluid, widespread osseous metastatic disease, small hiatal hernia, colonic diverticulosis GI Surgeries / Procedures:  7/8 EGD 7/7 EUS, upper GI tract 7/14: bypass surgery for creation of G-Junostomy 7/22: Insertion of gastrostomy tube   Central access: PICC placed 7/9 TPN start date: 7/9  Nutritional Goals:  Goal TPN rate is 90 mL/hr (provides 104 g of protein and 2238 kcals per day)  RD Assessment: Estimated Needs Total Energy Estimated Needs: 2000-2300kcal/day Total Protein Estimated Needs:  100-115g/day Total Fluid Estimated Needs: 2.1-2.4L/day  Current Nutrition:  TPN at 90 mL/hr NPO  Plan: Discussed with Dr. Stevie, will cut TPN rate today.   Decrease TPN with lipids to 72mL/hr at 1800 Electrolytes in TPN:  -Na 150 mEq/L (max) -K 40 mEq/L -Ca 10 mEq/L (max)  -Mg 8 mEq/L -Phos 25 mmol/L -Cl:Ac 1:1 Add standard MVI and trace elements to TPN Monitor TPN labs on Mon/Thurs, and as needed NS @ 151ml/hr per MD  Thank you for allowing pharmacy to be a part of this patient's care.  Rankin Dee, PharmD, BCPS, BCIDP Clinical Pharmacist 01/10/2024 7:17 AM

## 2024-01-10 NOTE — Op Note (Signed)
 Preoperative diagnosis: vomiting  Postoperative diagnosis: same   Procedure: upper endoscopy, laparoscopic g tube insertion, laparoscopic j tube insertion  Surgeon: Herlene Bureau, M.D.  Asst: Almarie Pringle, PA-C  Anesthesia: GETA  Indications for procedure: Chelsea Hansen is a 53 y.o. year old female with symptoms of vomiting after GJ anastomosis to bypass obstructing mass. She presents for enteric tube placement.  Description of procedure: The patient was brought into the operative suite. Anesthesia was administered with General endotracheal anesthesia. WHO checklist was applied. The patient was then placed in supine position. The area was prepped and draped in the usual sterile fashion.  Next, a right subcostal incision was made. A 5mm trocar was used to gain access to the peritoneal cavity by optical entry technique. Pneumoperitoneum was applied with a high flow and low pressure. The laparoscope was reinserted to confirm position.  2 additional trocars were placed one 12 mm trocar in the right mid abdomen and one 5 mm trocar in the left periumbilical space.  There was some acute adhesions to the periumbilical area and the left upper quadrant, these were taken down with blunt dissection.  The stomach was inspected for anastomosis appeared intact, there was some omental adhesions to the alimentary limb of the anastomosis this was bluntly removed.  Next, upper endoscopy was performed stomach was entered.  The endoscope was able to be advanced through the alimenteric limb after the alimenteric limb was held more medially to avoid kinking.  Once the scope was advanced beyond the anastomosis, an attempt was made to leave the wire in the alimenteric  limb this proved unsuccessful.  Next, I created a gastrostomy using T-bar's to pull the anterior wall of the body of the stomach up to the abdominal wall on the left side.  Next, the tract was dilated up and a GJ tube was introduced through the  tract and attempted to be passed laparoscopically into the enteric limb.  This proved unsuccessful.  Therefore, we decided to instead perform a G-tube and J-tube.  A 22 [place] G-tube was inserted into the tract balloon was inflated and the wafer was secured at 4 cm.  Next, portion about 40 cm distal to the anastomosis was chosen for J-tube.  Pursestring was made with 2-0 silk.  The GJ tube was introduced via the left abdominal wall and introduced through a enterotomy.  Pursestring was secured around the JP tube.  3 2-0 silk sutures were used to secure the intestine to the abdominal wall at this location.  Next, the J-tube was advanced 45 cm distally.  Balloon was then inflated with 5 mm of saline.  It was secured to the abdominal wall at 4 cm.  Marcaine  was injected into the muscle near the tube sites.  Pneumoperitoneum was evacuated.  Tubes were sutured to the skin with 2-0 nylon.  All incisions were closed with 4-0 Monocryl.  Dermabond was put in place.  Findings: adhesive disease leading to bunching of the alimentary   Specimen: none  Implant: 22 Fr G tube, 24 Fr J tube   Blood loss: 20 ml  Local anesthesia: 30 ml marcaine    Complications: none  Herlene Bureau, M.D. General, Bariatric, & Minimally Invasive Surgery [facility]

## 2024-01-10 NOTE — Anesthesia Procedure Notes (Signed)
 Procedure Name: Intubation Date/Time: 01/10/2024 9:57 AM  Performed by: Deeann Eva BROCKS, CRNAPre-anesthesia Checklist: Patient identified, Emergency Drugs available, Suction available and Patient being monitored Patient Re-evaluated:Patient Re-evaluated prior to induction Oxygen Delivery Method: Circle System Utilized Preoxygenation: Pre-oxygenation with 100% oxygen Induction Type: IV induction Ventilation: Mask ventilation without difficulty Laryngoscope Size: Mac and 3 Grade View: Grade II Tube type: Oral Tube size: 7.0 mm Number of attempts: 1 Airway Equipment and Method: Stylet and Oral airway Placement Confirmation: ETT inserted through vocal cords under direct vision, positive ETCO2 and breath sounds checked- equal and bilateral Secured at: 21 cm Tube secured with: Tape Dental Injury: Teeth and Oropharynx as per pre-operative assessment

## 2024-01-10 NOTE — Telephone Encounter (Signed)
 LVM for pt son stating that we have received his FMLA forms today.01/10/2024. No questions or concerns to be noted at this time.

## 2024-01-11 ENCOUNTER — Encounter (HOSPITAL_COMMUNITY): Payer: Self-pay | Admitting: General Surgery

## 2024-01-11 DIAGNOSIS — R112 Nausea with vomiting, unspecified: Secondary | ICD-10-CM | POA: Diagnosis not present

## 2024-01-11 DIAGNOSIS — N179 Acute kidney failure, unspecified: Secondary | ICD-10-CM | POA: Diagnosis not present

## 2024-01-11 LAB — RENAL FUNCTION PANEL
Albumin: 2.9 g/dL — ABNORMAL LOW (ref 3.5–5.0)
Anion gap: 8 (ref 5–15)
BUN: 14 mg/dL (ref 6–20)
CO2: 21 mmol/L — ABNORMAL LOW (ref 22–32)
Calcium: 7.1 mg/dL — ABNORMAL LOW (ref 8.9–10.3)
Chloride: 105 mmol/L (ref 98–111)
Creatinine, Ser: 0.51 mg/dL (ref 0.44–1.00)
GFR, Estimated: >60 mL/min (ref >60)
Glucose, Bld: 127 mg/dL — ABNORMAL HIGH (ref 70–99)
Phosphorus: 1.8 mg/dL — ABNORMAL LOW (ref 2.5–4.6)
Potassium: 5.2 mmol/L — ABNORMAL HIGH (ref 3.5–5.1)
Sodium: 134 mmol/L — ABNORMAL LOW (ref 135–145)

## 2024-01-11 LAB — CBC WITH DIFFERENTIAL/PLATELET
Abs Immature Granulocytes: 0.22 K/uL — ABNORMAL HIGH (ref 0.00–0.07)
Basophils Absolute: 0 K/uL (ref 0.0–0.1)
Basophils Relative: 1 %
Eosinophils Absolute: 0 K/uL (ref 0.0–0.5)
Eosinophils Relative: 0 %
HCT: 25.9 % — ABNORMAL LOW (ref 36.0–46.0)
Hemoglobin: 8.2 g/dL — ABNORMAL LOW (ref 12.0–15.0)
Immature Granulocytes: 4 %
Lymphocytes Relative: 20 %
Lymphs Abs: 1 K/uL (ref 0.7–4.0)
MCH: 24.1 pg — ABNORMAL LOW (ref 26.0–34.0)
MCHC: 31.7 g/dL (ref 30.0–36.0)
MCV: 76.2 fL — ABNORMAL LOW (ref 80.0–100.0)
Monocytes Absolute: 0.4 K/uL (ref 0.1–1.0)
Monocytes Relative: 9 %
Neutro Abs: 3.3 K/uL (ref 1.7–7.7)
Neutrophils Relative %: 66 %
Platelets: 179 K/uL (ref 150–400)
RBC: 3.4 MIL/uL — ABNORMAL LOW (ref 3.87–5.11)
RDW: 17.8 % — ABNORMAL HIGH (ref 11.5–15.5)
Smear Review: NORMAL
WBC: 5 K/uL (ref 4.0–10.5)
nRBC: 0.4 % — ABNORMAL HIGH (ref 0.0–0.2)

## 2024-01-11 LAB — GLUCOSE, CAPILLARY
Glucose-Capillary: 123 mg/dL — ABNORMAL HIGH (ref 70–99)
Glucose-Capillary: 124 mg/dL — ABNORMAL HIGH (ref 70–99)
Glucose-Capillary: 126 mg/dL — ABNORMAL HIGH (ref 70–99)
Glucose-Capillary: 107 mg/dL — ABNORMAL HIGH (ref 70–99)
Glucose-Capillary: 117 mg/dL — ABNORMAL HIGH (ref 70–99)
Glucose-Capillary: 107 mg/dL — ABNORMAL HIGH (ref 70–99)

## 2024-01-11 LAB — DIFFERENTIAL
Abs Immature Granulocytes: 0.4 K/uL — ABNORMAL HIGH (ref 0.00–0.07)
Basophils Absolute: 0.1 K/uL (ref 0.0–0.1)
Basophils Relative: 1 %
Eosinophils Absolute: 0 K/uL (ref 0.0–0.5)
Eosinophils Relative: 1 %
Immature Granulocytes: 8 %
Lymphocytes Relative: 25 %
Lymphs Abs: 1.2 K/uL (ref 0.7–4.0)
Monocytes Absolute: 0.3 K/uL (ref 0.1–1.0)
Monocytes Relative: 6 %
Neutro Abs: 2.9 K/uL (ref 1.7–7.7)
Neutrophils Relative %: 59 %

## 2024-01-11 LAB — MAGNESIUM: Magnesium: 2.9 mg/dL — ABNORMAL HIGH (ref 1.7–2.4)

## 2024-01-11 LAB — PHOSPHORUS: Phosphorus: 1.7 mg/dL — ABNORMAL LOW (ref 2.5–4.6)

## 2024-01-11 MED ORDER — TRIMETHOBENZAMIDE HCL 100 MG/ML IM SOLN
200.0000 mg | Freq: Four times a day (QID) | INTRAMUSCULAR | Status: DC | PRN
  Administered 2024-01-11 – 2024-01-13 (×6): 200 mg via INTRAMUSCULAR
  Filled 2024-01-11 (×9): qty 2

## 2024-01-11 MED ORDER — ACETAMINOPHEN 160 MG/5ML PO SOLN
975.0000 mg | Freq: Four times a day (QID) | ORAL | Status: DC
  Administered 2024-01-12 (×2): 975 mg
  Filled 2024-01-11 (×3): qty 40.6

## 2024-01-11 MED ORDER — SODIUM PHOSPHATES 45 MMOLE/15ML IV SOLN
20.0000 mmol | Freq: Once | INTRAVENOUS | Status: AC
  Administered 2024-01-11: 20 mmol via INTRAVENOUS
  Filled 2024-01-11: qty 6.67

## 2024-01-11 MED ORDER — ACETAMINOPHEN 160 MG/5ML PO SOLN
325.0000 mg | ORAL | Status: AC
  Administered 2024-01-11: 325 mg
  Filled 2024-01-11: qty 20.3

## 2024-01-11 MED ORDER — TRAVASOL 10 % IV SOLN
INTRAVENOUS | Status: AC
  Filled 2024-01-11: qty 518.4

## 2024-01-11 NOTE — Progress Notes (Signed)
 PHARMACY - TOTAL PARENTERAL NUTRITION CONSULT NOTE   Indication: Pancreatic mass with duodenal stricture/obstruction  Patient Measurements: Height: 5' 11 (180.3 cm) Weight: 81.9 kg (180 lb 8.9 oz) IBW/kg (Calculated) : 70.8 TPN AdjBW (KG): 81.9 Body mass index is 25.18 kg/m. Usual Weight: 92.5 kg in April of this year  Assessment:  Pharmacy consulted to manage TPN for  53 yo needing nutritional support while awaiting surgical intervention for pancreatic mass with duodenal stricture/obstruction in the setting of metastatic breast cancer resulting in poor oral intake, persistent nausea and vomiting, and weight loss.  7/22 Recent repeated episodes of large volume emesis. Patient reportedly agreeable to plan for gastrojejunostomy tube for drainage of the stomach and downstream tube feeds and now NPO for surgery. 7/23 Two noted emesis events early AM.   Glucose / Insulin :  - No Hx DM.  - BG remains < 150.  - CBGs and SSI have been stopped.   Electrolytes:  - Na slightly low at 134 but similar to previous level of 136  - K+ elevated at 5.2  - Phos low at 1.8  - Others are WNL today including corrected Ca.   Renal: SCr and BUN WNL  Hepatic: albumin  low but stable at 2.8; alk phos elevated and rising 823; transaminases and bilirubin remain WNL; triglycerides elevated at 274.  Intake / Output:  NS @ 158ml/hr. NG output trending down.  GI Imaging: 7/4 MR abdomen MRCP: pancreatic mass, vertebral metastases, pancolonic diverticulosis 7/10 CT: pancreatic head equivocal fullness, small amount free pelvic fluid, widespread osseous metastatic disease, small hiatal hernia, colonic diverticulosis GI Surgeries / Procedures:  7/8 EGD 7/7 EUS, upper GI tract 7/14: bypass surgery for creation of G-Junostomy 7/22: Insertion of gastrostomy tube   Central access: PICC placed 7/9 TPN start date: 7/9  Nutritional Goals:  Goal TPN rate is 90 mL/hr (provides 104 g of protein and 2238 kcals per  day)  RD Assessment: Estimated Needs Total Energy Estimated Needs: 2000-2300kcal/day Total Protein Estimated Needs: 100-115g/day Total Fluid Estimated Needs: >/= 2L  Current Nutrition:  TPN at 90 mL/hr NPO  Plan: Now - Sodium phosphate  20 mmol IV x 1   Discussed with Dr. Stevie, will cut TPN rate on 7/22.   TPN with lipids to 38mL/hr at 1800 Electrolytes in TPN:  -Na 150 mEq/L (max) -K 0  mEq/L -Ca 10 mEq/L (max)  -Mg 8 mEq/L -Phos 25 mmol/L ( max)  -Cl:Ac 1:1 Add standard MVI and trace elements to TPN Monitor TPN labs on Mon/Thurs, and as needed NS @ 153ml/hr expired. Discussed with MD. MD to order    Dolphus Roller, PharmD, BCPS 01/11/2024 10:59 AM

## 2024-01-11 NOTE — Progress Notes (Signed)
 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  Of note, she initially presented to ER on 6/4 for a 3 week history of nausea/vomiting, returned to ER on 6/5 and was admitted, was readmitted on 6/16 and 6/21, followed by her most recent admission on 7/3.   She has since undergone imaging with MRCP on 7/4 demonstrating mass like appearance of the pancreatic head 2.5 x 2.3 cm concerning for pancreatic adenocarcinoma.  CA 19-9 was 465.  EUS attempted on 7/7, but unsuccessful due to duodenal stenosis, again attempted on 7/9 and unsuccessful.  General surgery was consulted and she had NG tube placement, received TPN, and underwent laparoscopic gastrojejunostomy to bypass stricture on 01/02/2024.  She unfortunately has continued vomiting and underwent laparoscopic insertion of gastrostomy and jejunostomy tube on 01/10/2024.      Today, [name] is tearful because she is still vomiting.  She notes she vomited twice overnight and has struggled with tolerating her pain medication because it is making her hallucinate, but she is in a lot of pain.  She tells me that she is tired of being sick and hopes she can start to feel better soon.     Objective:  Vitals:   01/11/24 0915 01/11/24 1155  BP: 100/65 113/74  Pulse: 78 88  Resp:  14  Temp:  98.4 F (36.9 C)  SpO2: 97% 100%    Body mass index is 25.18 kg/m.  Intake/Output Summary (Last 24 hours) at 01/11/2024 1239 Last data filed at 01/11/2024 1126 Gross per 24 hour  Intake 2620.02 ml  Output 1600 ml  Net 1020.02 ml     Sclerae unicteric  Oropharynx shows no thrush or other lesions  No cervical or supraclavicular adenopathy  Lungs no rales or wheezes--auscultated anterolaterally  Heart regular rate and rhythm  Abdomen soft, +BS  Neuro  nonfocal  Tearful, flat affect  CBG (last 3)  Recent Labs    01/11/24 0434 01/11/24 0743 01/11/24 1153  GLUCAP 123* 124* 126*     Labs:  Lab Results  Component Value Date   WBC 5.0 01/11/2024   HGB 8.2 (L) 01/11/2024   HCT 25.9 (L) 01/11/2024   MCV 76.2 (L) 01/11/2024   PLT 179 01/11/2024   NEUTROABS 3.3 01/11/2024    Basic Metabolic Panel: Recent Labs  Lab 01/05/24 0256 01/06/24 0416 01/07/24 0358 01/07/24 1742 01/08/24 0407 01/09/24 0346 01/10/24 1441 01/11/24 0210  NA 135 134*  134* 134* 136 136 139 136 134*  K 3.9 3.8  3.8 3.4* 4.1 3.6 4.1 3.7 5.2*  CL 104 104  104 105 109 108 109 107 105  CO2 23 23  22 22 22  21* 22 18* 21*  GLUCOSE 123* 141*  140* 127* 115* 123* 114* 99 127*  BUN 16 19  19 18 15 13 14 12 14   CREATININE 0.69 0.57  0.56 0.47 0.46 0.39* 0.46 0.34* 0.51  CALCIUM  7.8* 7.8*  7.8* 7.7* 7.8* 7.7* 8.1* 6.6* 7.1*  MG 2.2 2.2 2.1  --  2.0 2.1 1.4* 2.9*  PHOS 3.6 3.4  3.4  --   --   --  2.5  --  1.7*  1.8*   GFR Estimated Creatinine Clearance: 93 mL/min (by C-G formula based on SCr of 0.51 mg/dL). Liver Function Tests: Recent Labs  Lab 01/05/24 0256  01/06/24 0416 01/09/24 0346 01/11/24 0210  AST 35  --  26  --   ALT 36  --  31  --   ALKPHOS 673*  --  823*  --   BILITOT 0.7  --  0.6  --   PROT 6.0*  --  6.2*  --   ALBUMIN  2.8* 2.8* 2.8* 2.9*     Assessment/Plan: 53 y.o. 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    Intractable nausea/vomiting: She has Zofran , Compazine  on her list.  She met with Dr. Stevie and Vertell Pringle today from general surgery.  They plan to troubleshoot the G tube that was placed yesterday, which will hopefully help.  Dr. Cindy, hospitalist and I spoke and he is considering ordering Tigan  for nausea.  We discussed and I spoke with Levon Borer, NP in palliative care, and requested she stop by for consultation for any additional  recommendations she may have.  She agreed to stop by later today and weigh in. Pain management: She has struggled with being in pain and some of the opiates causing hypotension and hallucinations.  After discussing with Dr. Cindy, I spoke with Levon in palliative care who is going to come by and see Shona and weigh in on her pain management.   Family Support: [place] has a good support system including her five children and her mom, Chelsea Hansen.  During her hospitalization I have met with her children and mom.  They are understandably concerned about Chelsea Hansen and are hoping she feels better soon.   Other medical issues per the primary hospitalist team.  Thank you for your excellent care of our mutual patient.    Time spent 40 minutes.    [name], NP 01/11/2024  12:39 PM Medical Oncology and Hematology [facility] [address] [place], [place] [address] Tel. [phone]    Fax. [phone]

## 2024-01-11 NOTE — Progress Notes (Signed)
 Pt had an eventful night, 2 episodes of emesis green emesis, 11:40 pm - 300 ml and 5:45 am - 200 ml of green emesis, the GTube drained a total of 50 ml green content. GT drained sluggish milking tube several time to push drainage through. Pt states tibe feels backed up.  Pt complained of increased pain on left side 10/10 sharp stabbing pain with waves. Nauseated throughout the shift , medicated as ordered. Pt able to sleep after meds administered then wake with increased pain, crying at times. Pt up to [facility] to void once during the shift. Will update oncoming RN. SRP, RN

## 2024-01-11 NOTE — Progress Notes (Addendum)
 1 Day Post-Op   Subjective/Chief Complaint: Reports nausea and large volume emesis overnight. No BM yet since surgery yesterday   Objective: Vital signs in last 24 hours: Temp:  [97.7 F (36.5 C)-98.5 F (36.9 C)] 98.4 F (36.9 C) (07/23 1155) Pulse Rate:  [78-92] 88 (07/23 1155) Resp:  [14-20] 14 (07/23 1155) BP: (98-127)/(58-83) 113/74 (07/23 1155) SpO2:  [97 %-100 %] 100 % (07/23 1155) Last BM Date : 01/10/24  Intake/Output from previous day: 07/22 0701 - 07/23 0700 In: 3665.7 [I.V.:2562.7; NG/GT:262.3; IV Piggyback:810.7] Out: 920 [Urine:400; Emesis/NG output:500; Blood:20] Intake/Output this shift: Total I/O In: 200 [NG/GT:200] Out: 700 [Urine:700]  Abdomen soft and minimally tender, incisions clean dry intact with glue G tube to gravity but not draining much J tube with TF running    Lab Results:  Recent Labs    01/10/24 1654 01/11/24 0210  WBC 4.9 5.0  HGB 8.6* 8.2*  HCT 27.7* 25.9*  PLT 179 179   BMET Recent Labs    01/10/24 1441 01/11/24 0210  NA 136 134*  K 3.7 5.2*  CL 107 105  CO2 18* 21*  GLUCOSE 99 127*  BUN 12 14  CREATININE 0.34* 0.51  CALCIUM  6.6* 7.1*   PT/INR No results for input(s): LABPROT, INR in the last 72 hours. ABG No results for input(s): PHART, HCO3 in the last 72 hours.  Invalid input(s): PCO2, PO2  Studies/Results: No results found.     Anti-infectives: Anti-infectives (From admission, onward)    Start     Dose/Rate Route Frequency Ordered Stop   01/10/24 0600  ceFAZolin  (ANCEF ) IVPB 2g/100 mL premix        2 g 200 mL/hr over 30 Minutes Intravenous On call to O.R. 01/09/24 1324 01/10/24 0948   01/02/24 1245  ceFAZolin  (ANCEF ) 2-4 GM/100ML-% IVPB       Note to Pharmacy: Kathern Quant: cabinet override      01/02/24 1245 01/03/24 0059   12/22/23 2230  fluconazole  (DIFLUCAN ) IVPB 200 mg  Status:  Discontinued        200 mg 100 mL/hr over 60 Minutes Intravenous Every 24 hours 12/22/23 2132  01/03/24 1126       Assessment/Plan: Pancreatic mass causing duodenal obstruction/stricture  POD 9- S/P LAPAROSCOPIC CREATION GASTRJEJONOSTOMY, 7/14, Dr. Lyndel  POD 1 - S/P UPPER ENDORSCOPY, LAPAROSCOPY G TUBE INSERTION, LAPAROSCOPIC J TUBE INSTERTION 7/22 Dr. Stevie - wean TPN, continue J tube feeds; no crushed meds per J tube, only liquids. - continue G tube to gravity, I will adjust today in attempt to help with nausea/vomiting  Pancreatic mass causing duodenal obstruction  -CA 19-9 is 465 - EUS 7/7 w/ bx that were nonspecific.   Metastatic breast cancer w/ mets to bone    FEN - CLD VTE - ok for chemical prophylaxis, SCDs HTN HLD protein calorie nutrition - PICC, TPN   I reviewed hospitalist notes, last 24 h vitals and pain scores, last 48 h intake and output, last 24 h labs and trends, and last 24 h imaging results.   Chelsea GORMAN Pringle, PA-C  01/11/2024

## 2024-01-11 NOTE — Progress Notes (Signed)
 Physical Therapy Treatment Patient Details Name: Chelsea Hansen MRN: [id number] DOB: 04-24-1971 Today's Date: 01/11/2024   History of Present Illness Pt is a 53 y.o. female presenting with nausea and vomiting and admitted for acute kidney injury on 12/22/23.  Pt S/P LAPAROSCOPIC CREATION GASTRJEJONOSTOMY 01/01/24. s/p upper endoscopy, laparoscopic g tube insertion, laparoscopic j tube insertion on 7/22. Past medical history significant of breast cancer, Duodenal stenosis, esophagitis Constipation, HLD, HTN , anemia, DVT, asthma, SVT.    PT Comments  Pt requiring significantly incr assist this session with limitations d/t pain, fatigue, nausea. Pt agreeable to PT however requests limited activity d/t above symptoms, asks to use BSC. Assisted squat pivot transfer x2 bed <> BSC.  RN present, assisted with CHG bath and gown change while pt sitting. Continue PT in acute setting. Recommend HHPT    If plan is discharge home, recommend the following: A little help with bathing/dressing/bathroom;Assistance with cooking/housework;Assist for transportation;Supervision due to cognitive status   Can travel by private vehicle        Equipment Recommendations  None recommended by PT;Other (comment) (TBD)    Recommendations for Other Services       Precautions / Restrictions Precautions Precautions: Fall Precaution/Restrictions Comments: PICC line, J and G  tube Restrictions Weight Bearing Restrictions Per Provider Order: No     Mobility  Bed Mobility Overal bed mobility: Needs Assistance Bed Mobility: Supine to Sit, Sit to Supine     Supine to sit: Min assist, HOB elevated Sit to supine: Min assist, Mod assist, +2 for physical assistance, +2 for safety/equipment   General bed mobility comments: incr time d/t pain, assist to progress LEs off bed; assist to control trunk descent and lift LEs on to bed    Transfers Overall transfer level: Needs assistance Equipment used: None Transfers: Bed to  chair/wheelchair/BSC       Squat pivot transfers: Min assist     General transfer comment: incr time, assist to bring hips up to clear BSC arm, improved ability to extend knees coming to 3/4 stand for [facility] to bed transfer    Ambulation/Gait                   Stairs             Wheelchair Mobility     Tilt Bed    Modified Rankin (Stroke Patients Only)       Balance                                            Communication Communication Communication: No apparent difficulties  Cognition Arousal: Alert Behavior During Therapy: WFL for tasks assessed/performed   PT - Cognitive impairments: No apparent impairments                         Following commands: Intact      Cueing Cueing Techniques: Verbal cues  Exercises      General Comments General comments (skin integrity, edema, etc.):RN present and assisted  with CHG bath and gown change     Pertinent Vitals/Pain Pain Assessment Pain Assessment: Faces Faces Pain Scale: Hurts whole lot Pain Location: abd and genralized pain with movement Pain Descriptors / Indicators: Grimacing Pain Intervention(s): Limited activity within patient's tolerance, Monitored during session, Premedicated before session, Repositioned, Relaxation    Home Living  Prior Function            PT Goals (current goals can now be found in the care plan section) Acute Rehab PT Goals Patient Stated Goal: to get better PT Goal Formulation: With patient Time For Goal Achievement: 01/17/24 Potential to Achieve Goals: Fair Progress towards PT goals: Not progressing toward goals - comment (ltd by pain and fatique)    Frequency    Min 3X/week      PT Plan      Co-evaluation              AM-PAC PT 6 Clicks Mobility   Outcome Measure  Help needed turning from your back to your side while in a flat bed without using bedrails?: A Lot Help needed  moving from lying on your back to sitting on the side of a flat bed without using bedrails?: A Lot Help needed moving to and from a bed to a chair (including a wheelchair)?: A Lot Help needed standing up from a chair using your arms (e.g., wheelchair or bedside chair)?: A Lot Help needed to walk in hospital room?: A Lot Help needed climbing 3-5 steps with a railing? : Total 6 Click Score: 11    End of Session Equipment Utilized During Treatment: Gait belt Activity Tolerance: Patient limited by fatigue;Patient limited by pain Patient left: in bed;with call bell/phone within reach;with bed alarm set;with SCD's reapplied Nurse Communication: Mobility status PT Visit Diagnosis: Difficulty in walking, not elsewhere classified (R26.2);Muscle weakness (generalized) (M62.81)     Time: 8576-8551 PT Time Calculation (min) (ACUTE ONLY): 25 min  Charges:    $Therapeutic Activity: 23-37 mins PT General Charges $$ ACUTE PT VISIT: 1 Visit                     Shakira Los, PT  Acute Rehab Dept [facility]) [phone]  01/11/2024    [facility] 01/11/2024, 2:58 PM

## 2024-01-11 NOTE — Plan of Care (Signed)
  Problem: Education: Goal: Knowledge of General Education information will improve Description: Including pain rating scale, medication(s)/side effects and non-pharmacologic comfort measures 01/11/2024 0049 by Waneta Marylynn SAUNDERS, RN Outcome: Progressing 01/10/2024 2353 by Waneta Marylynn SAUNDERS, RN Outcome: Progressing   Problem: Health Behavior/Discharge Planning: Goal: Ability to manage health-related needs will improve 01/11/2024 0049 by Waneta Marylynn SAUNDERS, RN Outcome: Progressing 01/10/2024 2353 by Waneta Marylynn SAUNDERS, RN Outcome: Progressing   Problem: Clinical Measurements: Goal: Ability to maintain clinical measurements within normal limits will improve 01/11/2024 0049 by Waneta Marylynn SAUNDERS, RN Outcome: Progressing 01/10/2024 2353 by Waneta Marylynn SAUNDERS, RN Outcome: Progressing Goal: Will remain free from infection 01/11/2024 0049 by Waneta Marylynn SAUNDERS, RN Outcome: Progressing 01/10/2024 2353 by Waneta Marylynn SAUNDERS, RN Outcome: Progressing

## 2024-01-11 NOTE — Progress Notes (Signed)
 Progress Note   Patient: Chelsea Hansen FMW:[id number] DOB: 11-06-1970 DOA: 12/22/2023     19 DOS: the patient was seen and examined on 01/11/2024   Brief hospital course: 52 y.o. 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   Imaging showed a duodenal obstruction and masslike area of the pancreas.  GI and General Surgery consulted.     EUS attempted on 7/7, but unsuccessful secondary to duodenal stenosis.     Repeat EGD/EUS on 7/9 again unsuccessful in biopsy.  Gen surg consulted and NG tube was placed, patient started on TPN, patient subsequently underwent laparoscopic creation of gastrojejunostomy for duodenal stricture.  Patient started on clear liquids and diet advanced to full liquid diet. -Patient with ongoing nausea vomiting and followed by general surgery. -Patient to undergo gastrojejunostomy tube tube insertion for drainage of the stomach and J-tube downstream for tube feeds today, 01/10/2024.  Assessment and Plan: #1 pancreatic mass with duodenal stricture/SBO in the setting of pancreatic mass/??  Ileus -Patient noted to have a new mass noted on imaging of the pancreas noted on CT abdomen and pelvis. - Patient underwent MRCP which was concerning for expansive, masslike appearance of the pancreatic head and uncinate as noted on prior CT measuring 2.5 x 2.3 cm which was noted to be new and interval to prior restaging PET/CT dated 06/10/2023 and highly concerning for pancreatic adenocarcinoma. -CA 19-9 noted at 465. - Patient seen in consultation by GI however unfortunately EUS which was attempted on 7 7 was unsuccessful secondary to duodenal stenosis with repeat EGD/EUS done on 7 /9 noted to be unsuccessful and unable to get a biopsy. - General Surgery was consulted and patient underwent a gastrojejunal bypass surgery on 7/14 with no concerning nodules or evidence of metastatic  disease on diagnostic laparoscopy. - Patient currently on TPN with diet advancement per general surgery. - Patient noted to have tolerated full liquid diet on 01/03/2024 however around 8 PM noted to had episode of emesis. -Patient noted with emesis the morning of 01/05/2024 and has been having emesis at least twice daily with last episode of emesis early on this morning, 01/09/2024.    - Was on scheduled Reglan  which has been discontinued.   -Continue IV PPI. - continue Ensure, TPN as per general surgery. -Was on full liquid diet however currently n.p.o. in anticipation of procedure today per general surgery. - Continue IV Toradol  as needed.  -Per general surgery upper GI on 7/18 without complication.   - Patient still with ongoing nausea and emesis with concerns for possible ileus.   - Keep potassium approximately 4, magnesium  approximately 2, mobilize.  - General Surgery following and have discussed with patient options of a gastrojejunostomy tube for drainage of stomach and downstream tube for feeds which will be placed today per general surgery.   -There is also noted.  General surgery, that patient would likely need to wait 8 weeks and undergo EGD EUS for further evaluation of pancreatic mass. - Per general surgery. - Discontinued fentanyl  PCA pump, Dilaudid , and oxycodone  given intolerance due to BP and lethargy -Palliative Care consulted to assist with symptom management, pending -Currently maintained on 650mg  tylenol . CT abd reviewed. Liver morphology appears normal. AST/ALT normal. Discussed with pharmacy, will increase tylenol  to 975mg  q6h. Follow LFT trends -give trial of Tigan  for nausea   2.  Hypertension -BP soft/borderline.   - Continue to hold home antihypertensive  medications.  - IV fluids. - received 2 units prbcs on 7/22 for hgb 6.1, remained stable afterwards   3.  Moderate protein calorie malnutrition - On TPN.   - Currently NPO.  - Patient for J-tube placement today.    4.  Candida esophagitis - Status post full course fluconazole .    5.  Chronic asthma -Stable.   6.  AKI on CKD stage 3a - Patient noted to have a creatinine of 3.2 on admission which improved with hydration and resolved, creatinine currently at 0.34.   7.  Hypophosphatemia/hypomagnesemia/hypomagnesemia -Being repleted per pharmacy as patient on TPN.   8.  Chronic anemia -Patient with no overt bleeding. - Hemoglobin at 6.1 from 8.5 from 7.4 after transfusion 1 unit PRBCs on 01/06/2024. -Hgb down to 6.1 on 7/22. Given 2 more units PRBC's    9.  Dizziness/lightheadedness/soft blood pressure/hypotension -Patient noted to have an episode of nausea and abdominal pain after drinking soup in the afternoon on 01/07/2024.  - Patient given IV Dilaudid , IV Compazine  and noted to have transient unresponsiveness of less than 1 minute and subsequently improved.  Repeat vitals at that time noted with initial blood pressure of 110/66 with blood pressure dropping down into the high 80s to low 90s - IV Dilaudid  discontinued per above.   - Continue IV fluids.   - Supportive care.          Subjective: Complaining of continued nausea and abd pain  Physical Exam: Vitals:   01/11/24 0838 01/11/24 0910 01/11/24 0915 01/11/24 1155  BP: 99/70 (!) 98/58 100/65 113/74  Pulse: 87 85 78 88  Resp: 16   14  Temp: 98.2 F (36.8 C)   98.4 F (36.9 C)  TempSrc: Oral   Oral  SpO2: 100% 99% 97% 100%  Weight:      Height:       General exam: Awake, laying in bed, in nad Respiratory system: Normal respiratory effort, no wheezing Cardiovascular system: regular rate, s1, s2 Gastrointestinal system: Soft, nondistended, positive BS Central nervous system: CN2-12 grossly intact, strength intact Extremities: Perfused, no clubbing Skin: Normal skin turgor, no notable skin lesions seen Psychiatry: Mood normal // no visual hallucinations   Data Reviewed:  Labs reviewed: Na 134, K 5.2, Cr 0.51, WBC 5.0, Hgb  8.2, Plts 179   Family Communication: Pt in room, family not at bedside. Tried calling pt's mother at phone number listed without answer  Disposition: Status is: Inpatient Remains inpatient appropriate because: severity of illness  Planned Discharge Destination: Home    Author: Garnette Pelt, MD 01/11/2024 6:37 PM  For on call review [email or web address].

## 2024-01-12 ENCOUNTER — Encounter: Payer: Self-pay | Admitting: General Practice

## 2024-01-12 DIAGNOSIS — R109 Unspecified abdominal pain: Secondary | ICD-10-CM

## 2024-01-12 DIAGNOSIS — R112 Nausea with vomiting, unspecified: Secondary | ICD-10-CM | POA: Diagnosis not present

## 2024-01-12 DIAGNOSIS — N179 Acute kidney failure, unspecified: Secondary | ICD-10-CM | POA: Diagnosis not present

## 2024-01-12 LAB — GLUCOSE, CAPILLARY
Glucose-Capillary: 108 mg/dL — ABNORMAL HIGH (ref 70–99)
Glucose-Capillary: 120 mg/dL — ABNORMAL HIGH (ref 70–99)
Glucose-Capillary: 112 mg/dL — ABNORMAL HIGH (ref 70–99)
Glucose-Capillary: 111 mg/dL — ABNORMAL HIGH (ref 70–99)
Glucose-Capillary: 109 mg/dL — ABNORMAL HIGH (ref 70–99)

## 2024-01-12 LAB — COMPREHENSIVE METABOLIC PANEL WITH GFR
ALT: 25 U/L (ref 0–44)
AST: 27 U/L (ref 15–41)
Albumin: 2.7 g/dL — ABNORMAL LOW (ref 3.5–5.0)
Alkaline Phosphatase: 841 U/L — ABNORMAL HIGH (ref 38–126)
Anion gap: 7 (ref 5–15)
BUN: 12 mg/dL (ref 6–20)
CO2: 20 mmol/L — ABNORMAL LOW (ref 22–32)
Calcium: 6.5 mg/dL — ABNORMAL LOW (ref 8.9–10.3)
Chloride: 106 mmol/L (ref 98–111)
Creatinine, Ser: 0.33 mg/dL — ABNORMAL LOW (ref 0.44–1.00)
GFR, Estimated: >60 mL/min (ref >60)
Glucose, Bld: 120 mg/dL — ABNORMAL HIGH (ref 70–99)
Potassium: 3.8 mmol/L (ref 3.5–5.1)
Sodium: 133 mmol/L — ABNORMAL LOW (ref 135–145)
Total Bilirubin: 0.7 mg/dL (ref 0.0–1.2)
Total Protein: 6.1 g/dL — ABNORMAL LOW (ref 6.5–8.1)

## 2024-01-12 LAB — CBC
HCT: 25.5 % — ABNORMAL LOW (ref 36.0–46.0)
Hemoglobin: 7.9 g/dL — ABNORMAL LOW (ref 12.0–15.0)
MCH: 23.6 pg — ABNORMAL LOW (ref 26.0–34.0)
MCHC: 31 g/dL (ref 30.0–36.0)
MCV: 76.1 fL — ABNORMAL LOW (ref 80.0–100.0)
Platelets: 178 K/uL (ref 150–400)
RBC: 3.35 MIL/uL — ABNORMAL LOW (ref 3.87–5.11)
RDW: 18.3 % — ABNORMAL HIGH (ref 11.5–15.5)
WBC: 4.2 K/uL (ref 4.0–10.5)
nRBC: 1 % — ABNORMAL HIGH (ref 0.0–0.2)

## 2024-01-12 LAB — MAGNESIUM: Magnesium: 2.4 mg/dL (ref 1.7–2.4)

## 2024-01-12 LAB — PHOSPHORUS: Phosphorus: 2.6 mg/dL (ref 2.5–4.6)

## 2024-01-12 MED ORDER — SODIUM CHLORIDE 0.9 % IV SOLN: INTRAVENOUS | Status: AC

## 2024-01-12 MED ORDER — OLANZAPINE 5 MG PO TBDP
2.5000 mg | ORAL_TABLET | Freq: Every day | ORAL | Status: DC
  Administered 2024-01-12: 2.5 mg via ORAL
  Filled 2024-01-12 (×2): qty 0.5

## 2024-01-12 MED ORDER — ACETAMINOPHEN 160 MG/5ML PO SOLN
975.0000 mg | Freq: Four times a day (QID) | ORAL | Status: DC
  Administered 2024-01-12 – 2024-01-17 (×16): 975 mg
  Filled 2024-01-12 (×16): qty 40.6

## 2024-01-12 MED ORDER — ONDANSETRON 8 MG/NS 50 ML IVPB
8.0000 mg | Freq: Four times a day (QID) | INTRAVENOUS | Status: DC | PRN
  Administered 2024-01-12 – 2024-01-13 (×3): 8 mg via INTRAVENOUS
  Filled 2024-01-12: qty 54
  Filled 2024-01-12 (×3): qty 8

## 2024-01-12 MED ORDER — ONDANSETRON HCL 4 MG/5ML PO SOLN: 8.0000 mg | Freq: Four times a day (QID) | ORAL | Status: DC | PRN

## 2024-01-12 MED ORDER — CALCIUM GLUCONATE-NACL 1-0.675 GM/50ML-% IV SOLN
1.0000 g | Freq: Once | INTRAVENOUS | Status: AC
  Administered 2024-01-12: 1000 mg via INTRAVENOUS
  Filled 2024-01-12: qty 50

## 2024-01-12 MED ORDER — TRAVASOL 10 % IV SOLN
INTRAVENOUS | Status: AC
  Filled 2024-01-12: qty 518.4

## 2024-01-12 MED ORDER — PROCHLORPERAZINE EDISYLATE 10 MG/2ML IJ SOLN
10.0000 mg | Freq: Four times a day (QID) | INTRAMUSCULAR | Status: DC | PRN
  Administered 2024-01-13 – 2024-01-17 (×11): 10 mg via INTRAVENOUS
  Filled 2024-01-12 (×11): qty 2

## 2024-01-12 MED ORDER — PROCHLORPERAZINE MALEATE 10 MG PO TABS: 10.0000 mg | ORAL_TABLET | Freq: Four times a day (QID) | ORAL | Status: DC | PRN

## 2024-01-12 NOTE — Consult Note (Signed)
 Palliative Care Consult Note                                  Date: 01/12/2024   Patient Name: Chelsea Hansen  DOB: [birth year]  MRN: [id number]  Age / Sex: 53 y.o., female  PCP: [name], NP Referring Physician: Cindy Garnette POUR, MD  Reason for Consultation: Symptom management  HPI/Patient Profile: 53 y.o. 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  She presented to the ED on 12/22/2023 with abdominal pain and vomiting.  Imaging showed a duodenal obstruction and pancreatic mass.  GI and general surgery consulted.    EUS attempted on 7/7, but was unsuccessful secondary to duodenal stenosis. Repeat EGD/EUS on 7/9 was again unsuccessful in biopsy.  On 7/14, patient underwent a gastrojejunal bypass surgery with no concerning nodules or evidence of metastatic disease on diagnostic laparoscopy.  Palliative Medicine was consulted for symptom management in the setting of ongoing nausea, vomiting, and abdominal pain.  Clinical Assessment and Goals of Care:   Extensive chart review has been completed including labs, vital signs, imaging, progress/consult notes, orders, medications and available advance directive documents.    I met with patient today at bedside to discuss symptom management. RN is also in the room and able to provide an update.   Patient is well-known to PMT.  I re-introduced Palliative Medicine as specialized medical care for people living with serious illness. It focuses on providing relief from the symptoms and stress of a serious illness.   Created space and opportunity for patient to express thoughts and feelings regarding current medical situation. .  Discussion: We briefly reviewed patient's hospital course and current clinical status, however our discussion today was primarily focused on symptom  management.  Patient reports abdominal pain, that she currently rates 10/10. Per [facility], she has not received as needed oxycodone  since 0813 yesterday morning (more than 24 hours).  Patient also reporting continuous nausea and having 4-5 episodes of emesis today (confirmed by RN).   I discussed with patient my recommendations - continue oxycodone  IR as needed, continue antiemetics as needed, and start Zyprexa  at bedtime.  Patient agrees with plan of care and expresses her eagerness to discharge home as soon as possible.  Discussed the importance of continued conversation with the medical team regarding overall plan of care and treatment options.  Questions and concerns addressed. Emotional support provided.    Review of Systems  Gastrointestinal:  Positive for abdominal pain, nausea and vomiting.    Objective:   Primary Diagnoses: Present on Admission:  AKI (acute kidney injury) (HCC)  Acute kidney injury superimposed on chronic kidney disease (HCC)  Hypocalcemia  Hypokalemia  Breast cancer metastasized to bone, right (HCC)  Acute esophagitis  Candida esophagitis (HCC)  Constipation  Dehydration  HLD (hyperlipidemia)  Transaminitis  Pancreatic mass  Prolonged QT interval  Hypotension  Acute kidney injury [facility])   Physical Exam Vitals reviewed.  Constitutional:      General: She is not in acute distress.    Appearance: She is ill-appearing.  Pulmonary:     Effort: Pulmonary effort is normal.  Neurological:     Mental Status: She is alert and oriented to person, place, and time.    Palliative Assessment/Data: PPS 50%     Assessment & Plan:   SUMMARY OF RECOMMENDATIONS  Continue full scope care PMT will follow-up tomorrow  Symptom Management:  Abdominal pain: Continue oxycodone  IR every 4 hours as needed for pain Discontinue IV Dilaudid  Intractable nausea/vomiting: Obtain EKG to assess QTc interval Start olanzapine  (Zyprexa ) 2.5 mg at bedtime - consider  increasing to 5 mg if QTc interval is normal Continue prochlorperazine  (Compazine ) 10 mg po (first-line option) or IV every 6 hours as needed for nausea/vomiting Continue ondansetron  (Zofran ) 8 mg p.o. (first-line option) or IV every 6 hours as needed for refractory nausea/vomiting not responsive to Compazine    Primary Decision Maker: PATIENT  Existing Vynca/ACP Documentation: HCPOA document on file naming Curtistine Albee as primary health agent and Ila Borer as alternate.  Code Status/Advance Care Planning: Full code  Prognosis:  Unable to determine  Discharge Planning:  Home with Home Health   Discussed with: Dr. Cindy   Thank you for allowing us  to participate in the care of Chelsea Hansen   Time Total: 80 minutes  Detailed review of medical records (labs, imaging, vital signs), medically appropriate exam, discussed with treatment team, counseling and education to patient, family, & staff, documenting clinical information, medication management, coordination of care.   Signed by: Recardo Loll, NP Palliative Medicine Team  Team Phone # [phone]  For individual providers, please see AMION

## 2024-01-12 NOTE — Progress Notes (Signed)
 PHARMACY - TOTAL PARENTERAL NUTRITION CONSULT NOTE   Indication: Pancreatic mass with duodenal stricture/obstruction  Patient Measurements: Height: 5' 11 (180.3 cm) Weight: 81.9 kg (180 lb 8.9 oz) IBW/kg (Calculated) : 70.8 TPN AdjBW (KG): 81.9 Body mass index is 25.18 kg/m. Usual Weight: 92.5 kg in April of this year  Assessment:  Pharmacy consulted to manage TPN for  53 yo needing nutritional support while awaiting surgical intervention for pancreatic mass with duodenal stricture/obstruction in the setting of metastatic breast cancer resulting in poor oral intake, persistent nausea and vomiting, and weight loss.  7/22 Recent repeated episodes of large volume emesis. Patient reportedly agreeable to plan for gastrojejunostomy tube for drainage of the stomach and downstream tube feeds and now NPO for surgery. 7/23 Two noted emesis events early AM.   Glucose / Insulin :  - No Hx DM.  - BG remains < 150.  - SSI has been stopped.   Electrolytes:  - Na slightly low at 133 but similar to previous level of 134 - Corr Ca is 7.5, low - Others are WNL today  Renal: SCr and BUN WNL  Hepatic: albumin  low but stable at 2.7; alk phos elevated and rising 841; transaminases and bilirubin remain WNL; triglycerides elevated at 274.  Intake / Output:  No mIVF, pt transfused 2 units PRBC   GI Imaging: 7/4 MR abdomen MRCP: pancreatic mass, vertebral metastases, pancolonic diverticulosis 7/10 CT: pancreatic head equivocal fullness, small amount free pelvic fluid, widespread osseous metastatic disease, small hiatal hernia, colonic diverticulosis GI Surgeries / Procedures:  7/8 EGD 7/7 EUS, upper GI tract 7/14: bypass surgery for creation of G-Junostomy 7/22: Insertion of gastrostomy tube   Central access: PICC placed 7/9 TPN start date: 7/9  Nutritional Goals:  Goal TPN rate is 90 mL/hr (provides 104 g of protein and 2238 kcals per day)  RD Assessment: Estimated Needs Total Energy Estimated  Needs: 2000-2300kcal/day Total Protein Estimated Needs: 100-115g/day Total Fluid Estimated Needs: >/= 2L  Current Nutrition:  TPN at 90 mL/hr NPO  Plan: Now - Calcium  gluconate 1 gr IV x1   Discussed with Almarie Pringle, PA , continue half-rate TPN rate on 7/24 with aim to stop on 7/25.    TPN with lipids to 18mL/hr at 1800 Electrolytes in TPN:  -Na 150 mEq/L (max) -K 25  mEq/L -Ca 10 mEq/L (max)  -Mg 8 mEq/L -Phos 25 mmol/L ( max)  -Cl:Ac 1:1 Add standard MVI and trace elements to TPN Monitor TPN labs on Mon/Thurs, and as needed BMP, magnesium , phosphorus with AM labs      Dolphus Roller, PharmD, BCPS 01/12/2024 11:20 AM

## 2024-01-12 NOTE — Progress Notes (Signed)
 Progress Note   Patient: Chelsea Hansen FMW:[id number] DOB: 07-Jul-1970 DOA: 12/22/2023     20 DOS: the patient was seen and examined on 01/12/2024   Brief hospital course: 53 y.o. 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   Imaging showed a duodenal obstruction and masslike area of the pancreas.  GI and General Surgery consulted.     EUS attempted on 7/7, but unsuccessful secondary to duodenal stenosis.     Repeat EGD/EUS on 7/9 again unsuccessful in biopsy.  Gen surg consulted and NG tube was placed, patient started on TPN, patient subsequently underwent laparoscopic creation of gastrojejunostomy for duodenal stricture.  Patient started on clear liquids and diet advanced to full liquid diet. -Patient with ongoing nausea vomiting and followed by general surgery. -Patient to undergo gastrojejunostomy tube tube insertion for drainage of the stomach and J-tube downstream for tube feeds today, 01/10/2024.  Assessment and Plan: #1 pancreatic mass with duodenal stricture/SBO in the setting of pancreatic mass/??  Ileus -Patient noted to have a new mass noted on imaging of the pancreas noted on CT abdomen and pelvis. - Patient underwent MRCP which was concerning for expansive, masslike appearance of the pancreatic head and uncinate as noted on prior CT measuring 2.5 x 2.3 cm which was noted to be new and interval to prior restaging PET/CT dated 06/10/2023 and highly concerning for pancreatic adenocarcinoma. -CA 19-9 noted at 465. - Patient seen in consultation by GI however unfortunately EUS which was attempted on 7 7 was unsuccessful secondary to duodenal stenosis with repeat EGD/EUS done on 7 /9 noted to be unsuccessful and unable to get a biopsy. - General Surgery was consulted and patient underwent a gastrojejunal bypass surgery on 7/14 with no concerning nodules or evidence of metastatic  disease on diagnostic laparoscopy. - Patient currently on TPN  - Patient noted to have tolerated full liquid diet on 01/03/2024 however around 8 PM noted to had episode of emesis. -Patient noted with emesis the morning of 01/05/2024 and has been having emesis at least twice daily with last episode of emesis early on this morning, 01/09/2024.    - Was on scheduled Reglan  which has been discontinued.   -Continue IV PPI. - continue Ensure, TPN as per general surgery. - Continue IV Toradol  as needed.  -Per general surgery upper GI on 7/18 without complication.   - Patient still with ongoing nausea and emesis with concerns for possible ileus.    - General Surgery following and have discussed with patient options of a gastrojejunostomy tube for drainage of stomach and downstream tube for feeds which was placed 7/22 -There is also noted.  General surgery, that patient would likely need to wait 8 weeks and undergo EGD EUS for further evaluation of pancreatic mass. - Discontinued fentanyl  PCA pump, Dilaudid , and oxycodone  given intolerance due to BP and lethargy -Palliative Care consulted to assist with symptom management, pending -Currently maintained on 650mg  tylenol . CT abd reviewed. Liver morphology appears normal. AST/ALT remains normal. -give trial of Tigan  for nausea -Now eager to go home   2.  Hypertension -BP soft/borderline.   - Continue to hold home antihypertensive medications.  - received 2 units prbcs on 7/22 for hgb 6.1, remained stable afterwards   3.  Moderate protein calorie malnutrition - On TPN.   - Currently NPO.  - Patient for J-tube placement today.   4.  Candida esophagitis - Status  post full course fluconazole .    5.  Chronic asthma -Stable.   6.  AKI on CKD stage 3a - Patient noted to have a creatinine of 3.2 on admission which improved with hydration and resolved, creatinine currently at 0.34.   7.  Hypophosphatemia/hypomagnesemia/hypomagnesemia -Being repleted  per pharmacy as patient on TPN.   8.  Chronic anemia -Patient with no overt bleeding. - Hemoglobin at 6.1 from 8.5 from 7.4 after transfusion 1 unit PRBCs on 01/06/2024. -Hgb down to 6.1 on 7/22. Given 2 more units PRBC's    9.  Dizziness/lightheadedness/soft blood pressure/hypotension -Patient noted to have an episode of nausea and abdominal pain after drinking soup in the afternoon on 01/07/2024.  - Patient given IV Dilaudid , IV Compazine  and noted to have transient unresponsiveness of less than 1 minute and subsequently improved.  Repeat vitals at that time noted with initial blood pressure of 110/66 with blood pressure dropping down into the high 80s to low 90s - IV Dilaudid  discontinued per above.   - Supportive care.       Subjective: Still nauseated, however is now much better per pt. Pt now able to transfer to [facility] on her own and take few steps. Eager to go home soon  Physical Exam: Vitals:   01/11/24 1155 01/11/24 2026 01/12/24 0451 01/12/24 1154  BP: 113/74 113/72 97/62 103/62  Pulse: 88 (!) 101 99 85  Resp: 14 20 20 20   Temp: 98.4 F (36.9 C) 98.9 F (37.2 C) 99.5 F (37.5 C)   TempSrc: Oral Oral Oral   SpO2: 100% 99% 100% 100%  Weight:      Height:       General exam: Conversant, in no acute distress Respiratory system: normal chest rise, clear, no audible wheezing Cardiovascular system: regular rhythm, s1-s2 Gastrointestinal system: Nondistended, nontender, pos BS Central nervous system: No seizures, no tremors Extremities: No cyanosis, no joint deformities Skin: No rashes, no pallor Psychiatry: Affect normal // mood seems normal  Data Reviewed:  Labs reviewed: Na 133, K 3.8, Cr 0.33, Alk phos 841, AST 27, ALT 25, WBC 4.2, Hgb 7.9, Plts 178   Family Communication: Pt in room, family at bedside  Disposition: Status is: Inpatient Remains inpatient appropriate because: severity of illness  Planned Discharge Destination: Home    Author: Garnette Pelt,  MD 01/12/2024 5:45 PM  For on call review [email or web address].

## 2024-01-12 NOTE — Progress Notes (Addendum)
 2 Days Post-Op   Subjective/Chief Complaint: Cc diarrhea and abdominal discomfort Nausea and emesis slightly better   Objective: Vital signs in last 24 hours: Temp:  [98.4 F (36.9 C)-99.5 F (37.5 C)] 99.5 F (37.5 C) (07/24 0451) Pulse Rate:  [88-101] 99 (07/24 0451) Resp:  [14-20] 20 (07/24 0451) BP: (97-113)/(62-74) 97/62 (07/24 0451) SpO2:  [99 %-100 %] 100 % (07/24 0451) Last BM Date : 01/10/24  Intake/Output from previous day: 07/23 0701 - 07/24 0700 In: 1173.8 [I.V.:493.8; NG/GT:600; IV Piggyback:50] Out: 1775 [Urine:1200; Drains:575] Intake/Output this shift: Total I/O In: 219.2 [I.V.:65.2; NG/GT:100; IV Piggyback:54] Out: 250 [Emesis/NG output:250]  Abdomen soft and minimally tender, incisions clean dry intact with glue G tube to gravity but not draining much J tube clamped   Lab Results:  Recent Labs    01/11/24 0210 01/12/24 0330  WBC 5.0 4.2  HGB 8.2* 7.9*  HCT 25.9* 25.5*  PLT 179 178   BMET Recent Labs    01/11/24 0210 01/12/24 0330  NA 134* 133*  K 5.2* 3.8  CL 105 106  CO2 21* 20*  GLUCOSE 127* 120*  BUN 14 12  CREATININE 0.51 0.33*  CALCIUM  7.1* 6.5*   PT/INR No results for input(s): LABPROT, INR in the last 72 hours. ABG No results for input(s): PHART, HCO3 in the last 72 hours.  Invalid input(s): PCO2, PO2  Studies/Results: No results found.     Anti-infectives: Anti-infectives (From admission, onward)    Start     Dose/Rate Route Frequency Ordered Stop   01/10/24 0600  ceFAZolin  (ANCEF ) IVPB 2g/100 mL premix        2 g 200 mL/hr over 30 Minutes Intravenous On call to O.R. 01/09/24 1324 01/10/24 0948   01/02/24 1245  ceFAZolin  (ANCEF ) 2-4 GM/100ML-% IVPB       Note to Pharmacy: Kathern Quant: cabinet override      01/02/24 1245 01/03/24 0059   12/22/23 2230  fluconazole  (DIFLUCAN ) IVPB 200 mg  Status:  Discontinued        200 mg 100 mL/hr over 60 Minutes Intravenous Every 24 hours 12/22/23 2132  01/03/24 1126       Assessment/Plan: Pancreatic mass causing duodenal obstruction/stricture  POD 9- S/P LAPAROSCOPIC CREATION GASTRJEJONOSTOMY, 7/14, Dr. Lyndel  POD 1 - S/P UPPER ENDORSCOPY, LAPAROSCOPY G TUBE INSERTION, LAPAROSCOPIC J TUBE INSTERTION 7/22 Dr. Stevie - wean TPN, resume J tube feeds (stopped for unclear reasons); no crushed meds per J tube, only liquids. - continue G tube to gravity, seems to be working and she is having less vomiting compared to yesterday - will add banatrol per J tube hopefully help with diarrhea  - patient tells me that she wants to go home. I think this is a reasonable goal. If discharge in next couple of days she would need to go home with G tube to gravity and on J tube feeds. In future if her nausea improves she may be able to have G tube clamped once her stomach is emptying better. Palliative consult is pending.   Pancreatic mass causing duodenal obstruction  -CA 19-9 is 465 - EUS 7/7 w/ bx that were nonspecific.   Metastatic breast cancer w/ mets to bone    FEN - CLD, G tube gravity, continuous J tube feeds  VTE - ok for chemical prophylaxis, SCDs HTN HLD protein calorie nutrition - PICC, TPN   I reviewed hospitalist notes, last 24 h vitals and pain scores, last 48 h intake and output, last 24  h labs and trends, and last 24 h imaging results.   Almarie GORMAN Pringle, PA-C  01/12/2024

## 2024-01-12 NOTE — TOC Progression Note (Signed)
 Transition of Care [facility]) - Progression Note    Patient Details  Name: Chelsea Hansen MRN: [id number] Date of Birth: 1970-12-05  Transition of Care [facility]) CM/SW Contact  Tawni CHRISTELLA Eva, LCSW Phone Number: 01/12/2024, 2:19 PM  Clinical Narrative:     CSW received message from MD pt to d/c tomorrow with TPN. CSW sent referral to [facility] with [employer] for home TPN arrangement. CSW attempted to arranged home health RN services unable to secure [facility] at this time. Care management to follow.    Expected Discharge Plan: Home/Self Care Barriers to Discharge: Continued Medical Work up               Expected Discharge Plan and Services       Living arrangements for the past 2 months: Apartment                                       Social Drivers of Health (SDOH) Interventions SDOH Screenings   Food Insecurity: No Food Insecurity (12/22/2023)  Recent Concern: Food Insecurity - Food Insecurity Present (11/24/2023)  Housing: Low Risk  (12/22/2023)  Transportation Needs: No Transportation Needs (12/22/2023)  Utilities: Not At Risk (12/22/2023)  Depression (PHQ2-9): Low Risk  (12/22/2023)  Recent Concern: Depression (PHQ2-9) - High Risk (11/30/2023)  Financial Resource Strain: High Risk (01/14/2022)  Physical Activity: Insufficiently Active (08/01/2023)  Stress: Stress Concern Present (10/10/2023)  Tobacco Use: Low Risk  (01/10/2024)    Readmission Risk Interventions    12/07/2023   12:21 PM 11/26/2023    2:58 PM  Readmission Risk Prevention Plan  Transportation Screening Complete Complete  PCP or Specialist Appt within 3-5 Days  Complete  HRI or Home Care Consult  Complete  Social Work Consult for Recovery Care Planning/Counseling  Complete  Palliative Care Screening  Not Applicable  Medication Review Oceanographer) Complete Complete  PCP or Specialist appointment within 3-5 days of discharge Complete   HRI or Home Care Consult Complete   SW Recovery Care/Counseling Consult  Complete   Palliative Care Screening Not Applicable   Skilled Nursing Facility Not Applicable

## 2024-01-13 ENCOUNTER — Encounter: Payer: Self-pay | Admitting: Hematology and Oncology

## 2024-01-13 DIAGNOSIS — Z7189 Other specified counseling: Secondary | ICD-10-CM

## 2024-01-13 DIAGNOSIS — R112 Nausea with vomiting, unspecified: Secondary | ICD-10-CM | POA: Diagnosis not present

## 2024-01-13 DIAGNOSIS — Z515 Encounter for palliative care: Secondary | ICD-10-CM

## 2024-01-13 DIAGNOSIS — N179 Acute kidney failure, unspecified: Secondary | ICD-10-CM | POA: Diagnosis not present

## 2024-01-13 LAB — COMPREHENSIVE METABOLIC PANEL WITH GFR
ALT: 22 U/L (ref 0–44)
AST: 25 U/L (ref 15–41)
Albumin: 2.6 g/dL — ABNORMAL LOW (ref 3.5–5.0)
Alkaline Phosphatase: 772 U/L — ABNORMAL HIGH (ref 38–126)
Anion gap: 7 (ref 5–15)
BUN: 16 mg/dL (ref 6–20)
CO2: 20 mmol/L — ABNORMAL LOW (ref 22–32)
Calcium: 6.9 mg/dL — ABNORMAL LOW (ref 8.9–10.3)
Chloride: 110 mmol/L (ref 98–111)
Creatinine, Ser: 0.38 mg/dL — ABNORMAL LOW (ref 0.44–1.00)
GFR, Estimated: >60 mL/min (ref >60)
Glucose, Bld: 113 mg/dL — ABNORMAL HIGH (ref 70–99)
Potassium: 3.8 mmol/L (ref 3.5–5.1)
Sodium: 137 mmol/L (ref 135–145)
Total Bilirubin: 0.6 mg/dL (ref 0.0–1.2)
Total Protein: 6.1 g/dL — ABNORMAL LOW (ref 6.5–8.1)

## 2024-01-13 LAB — CBC
HCT: 25 % — ABNORMAL LOW (ref 36.0–46.0)
Hemoglobin: 7.9 g/dL — ABNORMAL LOW (ref 12.0–15.0)
MCH: 24.1 pg — ABNORMAL LOW (ref 26.0–34.0)
MCHC: 31.6 g/dL (ref 30.0–36.0)
MCV: 76.2 fL — ABNORMAL LOW (ref 80.0–100.0)
Platelets: 177 K/uL (ref 150–400)
RBC: 3.28 MIL/uL — ABNORMAL LOW (ref 3.87–5.11)
RDW: 18.4 % — ABNORMAL HIGH (ref 11.5–15.5)
WBC: 4.2 K/uL (ref 4.0–10.5)
nRBC: 1.2 % — ABNORMAL HIGH (ref 0.0–0.2)

## 2024-01-13 LAB — MAGNESIUM: Magnesium: 2.5 mg/dL — ABNORMAL HIGH (ref 1.7–2.4)

## 2024-01-13 LAB — GLUCOSE, CAPILLARY
Glucose-Capillary: 104 mg/dL — ABNORMAL HIGH (ref 70–99)
Glucose-Capillary: 108 mg/dL — ABNORMAL HIGH (ref 70–99)
Glucose-Capillary: 114 mg/dL — ABNORMAL HIGH (ref 70–99)
Glucose-Capillary: 116 mg/dL — ABNORMAL HIGH (ref 70–99)
Glucose-Capillary: 119 mg/dL — ABNORMAL HIGH (ref 70–99)
Glucose-Capillary: 119 mg/dL — ABNORMAL HIGH (ref 70–99)
Glucose-Capillary: 127 mg/dL — ABNORMAL HIGH (ref 70–99)

## 2024-01-13 LAB — PHOSPHORUS: Phosphorus: 2.8 mg/dL (ref 2.5–4.6)

## 2024-01-13 MED ORDER — SODIUM CHLORIDE 0.9 % IV SOLN: INTRAVENOUS | Status: AC

## 2024-01-13 MED ORDER — CALCIUM GLUCONATE-NACL 1-0.675 GM/50ML-% IV SOLN
1.0000 g | Freq: Once | INTRAVENOUS | Status: AC
  Administered 2024-01-13: 1000 mg via INTRAVENOUS
  Filled 2024-01-13: qty 50

## 2024-01-13 MED ORDER — ERYTHROMYCIN ETHYLSUCCINATE 400 MG/5ML PO SUSR
400.0000 mg | Freq: Two times a day (BID) | ORAL | Status: DC
  Administered 2024-01-13 – 2024-01-17 (×7): 400 mg
  Filled 2024-01-13 (×9): qty 5

## 2024-01-13 MED ORDER — TRAVASOL 10 % IV SOLN
INTRAVENOUS | Status: AC
  Filled 2024-01-13: qty 518.4

## 2024-01-13 MED ORDER — OSMOLITE 1.5 CAL PO LIQD
1000.0000 mL | ORAL | Status: DC
  Filled 2024-01-13: qty 1000

## 2024-01-13 MED ORDER — OSMOLITE 1.5 CAL PO LIQD
1000.0000 mL | ORAL | Status: DC
  Filled 2024-01-13 (×3): qty 1000

## 2024-01-13 MED ORDER — OLANZAPINE 5 MG PO TBDP
5.0000 mg | ORAL_TABLET | Freq: Every day | ORAL | Status: DC
  Filled 2024-01-13 (×2): qty 1

## 2024-01-13 NOTE — Progress Notes (Signed)
 Daily Progress Note   Patient Name: Chelsea Hansen       Date: 01/13/2024 DOB: May 24, 1971  Age: 53 y.o. MRN#: [id number] Attending Physician: Cindy Garnette POUR, MD Primary Care Physician: [name], NP Admit Date: 12/22/2023  Reason for Consultation/Follow-up: Establishing goals of care  Subjective: Medical records reviewed including progress notes, labs, and imaging, MAR. She has received 4 doses of PRN Oxycodone  and 1 dose of PRN dilaudid  in the past 24 hours. She has also received PRN antiemetics. Per RN, most relief is reported to be from stopping tube feeds overnight.   Patient assessed at the bedside. She appears unwell, endorsing 10/10 pain and persistent nausea. She was agreeable to receiving another dose of PRN Oxycodone  when it became available per QH4 PRN orders. Her fiance arrived to visit during our conversation.   Explored her goals of care and care preferences. She wants to go home when she is feeling better and wants to continue to pursue cancer treatment - she was clear that she did not want hospice support and preferred to continue outpatient palliative care with my colleague in the cancer center.   She reports Zyprexa  was helpful last night and I provided her with update on EKG results, discussing option of increasing dose to 5mg . She was agreeable. Patient also reported feelings of both depression and anxiety. We discussed that Zyprexa  may help with this as well.   Questions and concerns addressed. PMT will continue to support holistically.   Length of Stay: 21   Physical Exam Vitals and nursing note reviewed.  Constitutional:      Appearance: She is ill-appearing.  Cardiovascular:     Rate and Rhythm: Normal rate.  Pulmonary:     Effort: Pulmonary effort is normal.  Neurological:     Mental Status: She is  alert.  Psychiatric:        Behavior: Behavior is cooperative.             Vital Signs: BP 97/63 (BP Location: Left Arm)   Pulse 98   Temp (!) 97.5 F (36.4 C) (Oral)   Resp 17   Ht 5' 11 (1.803 m)   Wt 81.5 kg   SpO2 100%   BMI 25.05 kg/m  SpO2: SpO2: 100 % O2 Device: O2 Device: Room Air O2 Flow Rate: O2 Flow Rate (L/min): 0 L/min      Palliative Assessment/Data: 50%   Palliative Care Assessment & Plan   Patient Profile: 53 y.o. female  with past medical history of breast cancer metastatic to bone on anastrozole  and Verzenio , HTN, DVT, asthma, and history of  esophageal candidiasis.  She presented to the ED on 12/22/2023 with abdominal pain and vomiting.  Imaging showed a duodenal obstruction and pancreatic mass.  GI and general surgery consulted.     EUS attempted on 7/7, but was unsuccessful secondary to duodenal stenosis. Repeat EGD/EUS on 7/9 was again unsuccessful in biopsy.  On 7/14, patient underwent a gastrojejunal bypass surgery with no concerning nodules or evidence of metastatic disease on diagnostic laparoscopy.   Palliative Medicine was consulted for symptom management in the setting of ongoing nausea, vomiting, and abdominal pain.  Assessment: Goals of care conversation Pancreatic mass Duodenal stricture/SBO AKI on CKD3a  Recommendations/Plan: Continue full code/full scope treatment Zyprexa  increased to 5mg  at bedtime Continue Compazine , Zofran , Tigan  PRN for nausea/vomiting. Agree with erythromycin  trial per hospitalist Goal is to return home and continue cancer treatment PMT will continue to follow and support   Prognosis:  Guarded  Discharge Planning: Home with Home Health  Care plan was discussed with Patient, patient's fiance, RN, attending/hospitalist, surgery PA, oncology NP     MDM High         Deldrick Linch SHAUNNA Fell, PA-C  Palliative Medicine Team Team phone # [phone]  Thank you for allowing the Palliative Medicine Team to  assist in the care of this patient. Please utilize secure chat with additional questions, if there is no response within 30 minutes please call the above phone number.  Palliative Medicine Team providers are available by phone from 7am to 7pm daily and can be reached through the team cell phone.  Should this patient require assistance outside of these hours, please call the patient's attending physician.

## 2024-01-13 NOTE — Plan of Care (Signed)

## 2024-01-13 NOTE — Progress Notes (Signed)
 Progress Note   Patient: Chelsea Hansen FMW:[id number] DOB: 12/07/1970 DOA: 12/22/2023     21 DOS: the patient was seen and examined on 01/13/2024   Brief hospital course: 53 y.o. 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   Imaging showed a duodenal obstruction and masslike area of the pancreas.  GI and General Surgery consulted.     EUS attempted on 7/7, but unsuccessful secondary to duodenal stenosis.     Repeat EGD/EUS on 7/9 again unsuccessful in biopsy.  Gen surg consulted and NG tube was placed, patient started on TPN, patient subsequently underwent laparoscopic creation of gastrojejunostomy for duodenal stricture.  Patient started on clear liquids and diet advanced to full liquid diet. -Patient with ongoing nausea vomiting and followed by general surgery. -Patient to undergo gastrojejunostomy tube tube insertion for drainage of the stomach and J-tube downstream for tube feeds today, 01/10/2024.  Assessment and Plan: #1 pancreatic mass with duodenal stricture/SBO in the setting of pancreatic mass/??  Ileus -Patient noted to have a new mass noted on imaging of the pancreas noted on CT abdomen and pelvis. - Patient underwent MRCP which was concerning for expansive, masslike appearance of the pancreatic head and uncinate as noted on prior CT measuring 2.5 x 2.3 cm which was noted to be new and interval to prior restaging PET/CT dated 06/10/2023 and highly concerning for pancreatic adenocarcinoma. -CA 19-9 noted at 465. - Patient seen in consultation by GI however unfortunately EUS which was attempted on 7 7 was unsuccessful secondary to duodenal stenosis with repeat EGD/EUS done on 7 /9 noted to be unsuccessful and unable to get a biopsy. - General Surgery was consulted and patient underwent a gastrojejunal bypass surgery on 7/14 with no concerning nodules or evidence of metastatic  disease on diagnostic laparoscopy. - Patient noted to have tolerated full liquid diet on 01/03/2024 however around 8 PM noted to had episode of emesis. -Patient noted with emesis the morning of 01/05/2024 and has been having emesis at least twice daily with last episode of emesis early on this morning, 01/09/2024.    -Per general surgery upper GI on 7/18 without complication.   - Patient still with ongoing nausea and emesis with gastrojejunostomy tube for drainage of stomach and downstream tube for feeds which was placed 7/22 -consideration for re-attempt at EGD EUS for further evaluation of pancreatic mass in 8 weeks - Discontinued fentanyl  PCA pump, Dilaudid , and oxycodone  given intolerance due to BP and lethargy -Palliative Care consulted to assist with symptom management -Currently maintained on 650mg  tylenol . Follow LFT trends -on trial of Tigan  for nausea -Still nauseated. Discussed with GI. Will give trial of erythromycin  -now on trickle tube feed with TPN. Plan to wean TPN   2.  Hypertension - Continue to hold home antihypertensive medications.  - received 2 units prbcs on 7/22 for hgb 6.1, remained stable afterwards -bp stable   3.  Moderate protein calorie malnutrition - On TPN.   - Currently NPO.  - Patient for J-tube placement today.   4.  Candida esophagitis - Status post full course fluconazole .    5.  Chronic asthma -Stable.   6.  AKI on CKD stage 3a - Patient noted to have a creatinine of 3.2 on admission which improved with hydration and resolved, creatinine currently at 0.34.   7.  Hypophosphatemia/hypomagnesemia/hypomagnesemia -Being repleted per pharmacy as patient on TPN.   8.  Chronic anemia -Patient with no overt bleeding. - Hemoglobin at 6.1 from 8.5 from 7.4 after transfusion 1 unit PRBCs on 01/06/2024. -Hgb down to 6.1 on 7/22. Given 2 more units PRBC's    9.  Dizziness/lightheadedness/soft blood pressure/hypotension -Patient noted to have an episode  of nausea and abdominal pain after drinking soup in the afternoon on 01/07/2024.  - Patient given IV Dilaudid , IV Compazine  and noted to have transient unresponsiveness of less than 1 minute and subsequently improved.  Repeat vitals at that time noted with initial blood pressure of 110/66 with blood pressure dropping down into the high 80s to low 90s - IV Dilaudid  discontinued per above.   - Supportive care.       Subjective: Nauseated this afternoon  Physical Exam: Vitals:   01/12/24 2006 01/13/24 0405 01/13/24 0700 01/13/24 1412  BP: 102/63 97/63  117/72  Pulse: (!) 106 98  81  Resp: 18 17  18   Temp: 98.4 F (36.9 C) (!) 97.5 F (36.4 C)  97.9 F (36.6 C)  TempSrc: Oral Oral  Oral  SpO2: 99% 100%  100%  Weight:   81.5 kg   Height:       General exam: Awake, laying in bed, in nad Respiratory system: Normal respiratory effort, no wheezing Cardiovascular system: regular rate, s1, s2 Gastrointestinal system: Soft, nondistended, positive BS Central nervous system: CN2-12 grossly intact, strength intact Extremities: Perfused, no clubbing Skin: Normal skin turgor, no notable skin lesions seen Psychiatry: Mood normal // no visual hallucinations   Data Reviewed:  Labs reviewed: Na 137, K 3.8, Cr 0.38, Alk phos 772, WBC 4.2, Hgb 7.9, Plts 177   Family Communication: Pt in room, family at bedside  Disposition: Status is: Inpatient Remains inpatient appropriate because: severity of illness  Planned Discharge Destination: Home    Author: Garnette Pelt, MD 01/13/2024 6:04 PM  For on call review [email or web address].

## 2024-01-13 NOTE — Progress Notes (Signed)
 Chelsea Hansen   DOB:Oct 23, 1970   FM#:[id number]   RDW#:[id number]  Subjective: 53 year old woman with metastatic breast cancer to the bone, duodenal stenosis, treated esophageal candidiasis, HTN, who presented with abdominal pain and emesis secondary to duodenal stenosis. (Previous hospitalizations around this).    Current Hospitalization Highlights MRCP 7/4: 2.5 x 2.3cm mass on pancreatic head concerning for pancreatic adenocarcinoma, CA 19-9 465 EUS attempt unsuccessful on 7/7 and 7/9 Gen surg consult for TPN, NG tube to prep for abd surgery 7/14: laparoscopic gastrojejunostomy to bypass stricture 7/22: due to continued vomiting laparoscopic insertion of gastrostomy and jejunostomy tube   Chelsea Hansen is lying in bed.  Her nurse is at the bedside and tells me that she did well overnight when the tube feeds were stopped but when she restarted them today at 15 mL an hour about an hour into this Chelsea Hansen felt nauseated and vomited.  She also had a very large bowel movement.  Chelsea Hansen continues to reiterate the fact that she really wants to go home however she is continue to have nausea and vomiting.  She is currently taking antiemetics including Compazine , Tigan , and Zofran .    For her pain she continues on Tylenol  given every 6 hours Dilaudid  0.5 mg that is ordered every 2 hours as needed, Robaxin  scheduled at 500 mg IV every 8 hours, oxycodone  10 mg via J-tube every 4 hours as needed. Chelsea Hansen has also struggled with anxiety and depression related to the amount of time that she has been  feeling sick.  Palliative care started her on Zyprexa  and she will begin 5 mg oral disintegrating tablet tonight.  She wants to go home, but she also wants to continue to feel better and work towards healing.      Objective:  Vitals:   01/13/24 0405 01/13/24 1412  BP: 97/63 117/72  Pulse: 98 81  Resp: 17 18  Temp: (!) 97.5 F (36.4 C) 97.9 F (36.6 C)  SpO2: 100% 100%    Body mass index is 25.05  kg/m.  Intake/Output Summary (Last 24 hours) at 01/13/2024 1502 Last data filed at 01/13/2024 1348 Gross per 24 hour  Intake 2130.99 ml  Output --  Net 2130.99 ml     Sclerae unicteric  Oropharynx shows no thrush or other lesions  No cervical or supraclavicular adenopathy  Lungs no rales or wheezes--auscultated anterolaterally  Heart regular rate and rhythm  Neuro nonfocal    CBG (last 3)  Recent Labs    01/13/24 0400 01/13/24 0735 01/13/24 1126  GLUCAP 108* 119* 104*     Labs:  Lab Results  Component Value Date   WBC 4.2 01/13/2024   HGB 7.9 (L) 01/13/2024   HCT 25.0 (L) 01/13/2024   MCV 76.2 (L) 01/13/2024   PLT 177 01/13/2024   NEUTROABS 3.3 01/11/2024    Basic Metabolic Panel: Recent Labs  Lab 01/09/24 0346 01/10/24 1441 01/11/24 0210 01/12/24 0330 01/13/24 0437  NA 139 136 134* 133* 137  K 4.1 3.7 5.2* 3.8 3.8  CL 109 107 105 106 110  CO2 22 18* 21* 20* 20*  GLUCOSE 114* 99 127* 120* 113*  BUN 14 12 14 12 16   CREATININE 0.46 0.34* 0.51 0.33* 0.38*  CALCIUM  8.1* 6.6* 7.1* 6.5* 6.9*  MG 2.1 1.4* 2.9* 2.4 2.5*  PHOS 2.5  --  1.7*  1.8* 2.6 2.8   GFR Estimated Creatinine Clearance: 93 mL/min (A) (by C-G formula based on SCr of 0.38 mg/dL (L)). Liver Function Tests: Recent  Labs  Lab 01/09/24 0346 01/11/24 0210 01/12/24 0330 01/13/24 0437  AST 26  --  27 25  ALT 31  --  25 22  ALKPHOS 823*  --  841* 772*  BILITOT 0.6  --  0.7 0.6  PROT 6.2*  --  6.1* 6.1*  ALBUMIN  2.8* 2.9* 2.7* 2.6*     Assessment/Plan: 54 y.o. with metastatic breast cancer to the bone, concern for pancreatic adenocarcinoma, admitted with duodenal stenosis, nausea/vomiting, and dehydration.    Nausea, vomiting: Tube feeds currently turned off.  G tube is draining.  Surgery is following.  Dr. Cindy is ordering erythromycin to help improve gut motility.  She continues on Zofran , Prochlorperazine , and Tigan .  Palliative care consulting. On TPN. Dehydration: IV fluids,  creatinine improved from the AKI she had at admission.  Pain: Her regimen includes Dilaudid , Oxycodone , Tylenol , Robaxin  (scheduled).  Appreciate palliative care input and consult.  Anxiety/depression: Started on Zyprexa  yesterday, increased to 5mg  tonight by palliative care. Chaplain from [facility] is following patient. Deconditioning: PT is consulting, missed today's session, last received PT yesterday.   Goals of care: I reviewed with Chelsea Hansen that I am concerned with her continuing to experience nausea and vomiting and that I did not believe she was ready for discharge.  We discussed that I am concerned that she continues to experience nausea and vomiting, she is not improving like we hoped and thought she would from her surgeries. I again reviewed my concern over the likely pancreatic cancer diagnosis. She verbalizes that she wants to go home, but she also verbalized that her number one goal is to stop vomiting.  She wants to think through what going home early might mean.  She is going to see palliative care later today and Dr. Odean tomorrow to discuss further.   Other medical issues as per hospitalist team: thank you for your excellent care of this patient.    Time spent: 35 minutes Chelsea JAYSON Kendall, NP 01/13/2024  3:02 PM Medical Oncology and Hematology [facility] [address] [place], [place] [address] Tel. [phone]    Fax. [phone]

## 2024-01-13 NOTE — Progress Notes (Signed)
 [facility] Spiritual Care Note  Followed up with Ms Hantz in person to offer renewed support and encouragement after following her struggles and procedures from a distance and seeing her length of stay increase long past when she hoped to go home. Brought her a handmade quilt as a gesture of encouragement. She reports that she has been listening to [employer] as a meaningful mindfulness tool and welcomes Spiritual Care whenever possible. Kept visit limited because she felt unwell and sleepy due to medication.  We plan to follow up by phone next week.   [address] [name], [place], [facility] Pager [phone] Voicemail [phone]

## 2024-01-13 NOTE — TOC Progression Note (Signed)
 Transition of Care [facility]) - Progression Note    Patient Details  Name: Chelsea Hansen MRN: [id number] Date of Birth: Oct 07, 1970  [facility]) CM/SW Contact  Tawni CHRISTELLA Eva, LCSW Phone Number: 01/13/2024, 3:53 PM  Clinical Narrative:     [facility] accepted pt for HHPT/OT, will need HH orders. Care Management to follow.   Expected Discharge Plan: Home w Home Health Services Barriers to Discharge: Continued Medical Work up               Expected Discharge Plan and Services       Living arrangements for the past 2 months: Apartment                                       Social Drivers of Health (SDOH) Interventions SDOH Screenings   Food Insecurity: No Food Insecurity (12/22/2023)  Recent Concern: Food Insecurity - Food Insecurity Present (11/24/2023)  Housing: Low Risk  (12/22/2023)  Transportation Needs: No Transportation Needs (12/22/2023)  Utilities: Not At Risk (12/22/2023)  Depression (PHQ2-9): Low Risk  (12/22/2023)  Recent Concern: Depression (PHQ2-9) - High Risk (11/30/2023)  Financial Resource Strain: High Risk (01/14/2022)  Physical Activity: Insufficiently Active (08/01/2023)  Stress: Stress Concern Present (10/10/2023)  Tobacco Use: Low Risk  (01/10/2024)    Readmission Risk Interventions    12/07/2023   12:21 PM 11/26/2023    2:58 PM  Readmission Risk Prevention Plan  Transportation Screening Complete Complete  PCP or Specialist Appt within 3-5 Days  Complete  HRI or Home Care Consult  Complete  Social Work Consult for Recovery Care Planning/Counseling  Complete  Palliative Care Screening  Not Applicable  Medication Review Oceanographer) Complete Complete  PCP or Specialist appointment within 3-5 days of discharge Complete   HRI or Home Care Consult Complete   SW Recovery Care/Counseling Consult Complete   Palliative Care Screening Not Applicable   Skilled Nursing Facility Not Applicable

## 2024-01-13 NOTE — Progress Notes (Signed)
 Nutrition Follow-up  DOCUMENTATION CODES:   Not applicable  INTERVENTION:  - Per Surgery, plan to restart tube feeds at trickles and NOT advance at this time. Osmolite 1.5 @ 15mL/hr  - Once able to advance tube feeds, recommend: Osmolite 1.5 at 55 ml/h (1320 ml per day) *Consider 10mL Q8-12H advancements Prosource TF20 60 ml daily Provides 2060 kcal, 103 gm protein, 1006 ml free water  daily  - FWF QID to better meet hydration needs. May need to increase if patient unable to tolerate anything PO.  - Plan to continue TPN at half rate (41mL/hr), to continue to meet ~50% of nutritional needs while patient unable to advance tube feeds.   - Diet per MD/Surgery. Currently remains on Clear Liquid diet.   - Once tolerating goal 24 hour TF's, can consider transitioning to 16 hour feeds to allow time off the pump: Osmolite 1.5 at 90 ml/hr (1600-0800) Provides 2160 kcal, 90 gm protein, 1097 ml free water  daily   NUTRITION DIAGNOSIS:   Unintentional weight loss related to chronic illness (Pancreatic mass with duodenal stricture/obstruction in the setting of metastatic breast cancer) as evidenced by percent weight loss (50# or 25% in <3 months). *ongoing  GOAL:   Patient will meet greater than or equal to 90% of their needs *progressing, on TF  MONITOR:   PO intake, Supplement acceptance, Diet advancement, Labs, Weight trends  REASON FOR ASSESSMENT:   Consult Enteral/tube feeding initiation and management  ASSESSMENT:   53 y.o. 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  7/3: admitted, clear liquids 7/5: FLD 7/6: CLD 7/7: NPO, s/p EUS 7/8: CLD, s/p EGD 7/9: TPN initiated 7/10: NPO 7/11: NGT placed for LIS 7/12: TPN advanced to goal 7/14: s/p gastrojejonostomy 7/15: FLD 7/22: s/p G-tube and J-tube placement; CLD; TF's initiated @ 20 7/23 TF stopped overnight for emesis 7/24 TF stopped overnight for emesis 7/25 TF stopped overnight for emesis  Patient has continued to have tube feeds stopped overnight for ongoing N/V.  Per Surgery's note today, patient reports N/V is better when tube feeds are stopped. Therefore, surgery planning to resume tube feeds at 64mL/hr and not advance at this time. Discussed with Surgery PA-C about adjusting orders to reflect new change and informed RN. Plan to continue TPN at 45mL/hr (meet 50% of needs) while patient unable to tolerate full strength tube feeds at this time.   Admission weight: 169 lbs Current weight: 179 lbs I&O's: +15.5L since 7/11   Medications: 100mL FWF QID, 50,000 units Vitamin D  weekly   Labs reviewed: - Triglycerides 274 (as of 7/21)   Diet Order:   Diet Order             Diet clear liquid Room service appropriate? Yes; Fluid consistency: Thin  Diet effective now                   EDUCATION NEEDS:  Education needs have been addressed  Skin:  Skin Assessment: Reviewed RN Assessment  Last BM:  7/24 - type 7  Height:  Ht Readings from Last 1 Encounters:  01/10/24 5' 11 (1.803 m)   Weight:  Wt Readings from Last 1 Encounters:  01/13/24 81.5 kg   Ideal Body Weight:  70 kg  BMI:  Body mass index is 25.05 kg/m.  Estimated Nutritional Needs:  Kcal:  2000-2300kcal/day Protein:  100-115g/day Fluid:  >/= 2L    Trude Ned RD, LDN Contact via Secure Chat.

## 2024-01-13 NOTE — Progress Notes (Signed)
 3 Days Post-Op   Subjective/Chief Complaint: Reports vomiting is improved with J tube feeds stopped. G tube draining Last BM was yesterday    Objective: Vital signs in last 24 hours: Temp:  [97.5 F (36.4 C)-98.4 F (36.9 C)] 97.5 F (36.4 C) (07/25 0405) Pulse Rate:  [85-106] 98 (07/25 0405) Resp:  [17-20] 17 (07/25 0405) BP: (97-103)/(62-63) 97/63 (07/25 0405) SpO2:  [99 %-100 %] 100 % (07/25 0405) Weight:  [81.5 kg] 81.5 kg (07/25 0700) Last BM Date : 01/12/24  Intake/Output from previous day: 07/24 0701 - 07/25 0700 In: 2250.2 [I.V.:1272.2; NG/GT:712; IV Piggyback:266] Out: 700 [Emesis/NG output:300; Drains:400] Intake/Output this shift: No intake/output data recorded.  Abdomen soft and minimally tender, incisions clean dry intact with glue G tube to gravity with bilious drainage  J tube with TF off   Lab Results:  Recent Labs    01/12/24 0330 01/13/24 0437  WBC 4.2 4.2  HGB 7.9* 7.9*  HCT 25.5* 25.0*  PLT 178 177   BMET Recent Labs    01/12/24 0330 01/13/24 0437  NA 133* 137  K 3.8 3.8  CL 106 110  CO2 20* 20*  GLUCOSE 120* 113*  BUN 12 16  CREATININE 0.33* 0.38*  CALCIUM  6.5* 6.9*   PT/INR No results for input(s): LABPROT, INR in the last 72 hours. ABG No results for input(s): PHART, HCO3 in the last 72 hours.  Invalid input(s): PCO2, PO2  Studies/Results: No results found.     Anti-infectives: Anti-infectives (From admission, onward)    Start     Dose/Rate Route Frequency Ordered Stop   01/10/24 0600  ceFAZolin  (ANCEF ) IVPB 2g/100 mL premix        2 g 200 mL/hr over 30 Minutes Intravenous On call to O.R. 01/09/24 1324 01/10/24 0948   01/02/24 1245  ceFAZolin  (ANCEF ) 2-4 GM/100ML-% IVPB       Note to Pharmacy: Kathern Quant: cabinet override      01/02/24 1245 01/03/24 0059   12/22/23 2230  fluconazole  (DIFLUCAN ) IVPB 200 mg  Status:  Discontinued        200 mg 100 mL/hr over 60 Minutes Intravenous Every 24  hours 12/22/23 2132 01/03/24 1126       Assessment/Plan: Pancreatic mass causing duodenal obstruction/stricture  POD 11- S/P LAPAROSCOPIC CREATION GASTRJEJONOSTOMY, 7/14, Dr. Lyndel  POD 3 - S/P UPPER ENDORSCOPY, LAPAROSCOPY G TUBE INSERTION, LAPAROSCOPIC J TUBE INSTERTION 7/22 Dr. Stevie - continue G tube to gravity, nausea/vomiting improved  - patient states nausea/vomting better when tube feeds were stopped, plan to resume today at trickle (15 mL) and NOT advance. no crushed meds per J tube, only liquids. - continue TPN given intolerance of TF  Pancreatic mass causing duodenal obstruction  -CA 19-9 is 465 - EUS 7/7 w/ bx that were nonspecific.   Metastatic breast cancer w/ mets to bone    FEN - CLD VTE - ok for chemical prophylaxis, SCDs HTN HLD protein calorie nutrition - PICC, TPN   I reviewed hospitalist notes, last 24 h vitals and pain scores, last 48 h intake and output, last 24 h labs and trends, and last 24 h imaging results.   Almarie GORMAN Pringle, PA-C  01/13/2024

## 2024-01-13 NOTE — Progress Notes (Signed)
 PT Cancellation Note  Patient Details Name: Chelsea Hansen MRN: [id number] DOB: 04-21-1971   Cancelled Treatment:    Reason Eval/Treat Not Completed: Other (comment) (vomiting). Attempted session 10:09am and 11:43am but pt sleeping soundly. Returned at 1:54pm and RN reports pt recently vomiting and now asleep. Will continue to check back as schedule permits.    Metta Ave PT, DPT 01/13/24, 1:54 PM

## 2024-01-13 NOTE — Progress Notes (Signed)
     Patient Name: Chelsea Hansen           DOB: Nov 06, 1970  MRN: [id number]      Admission Date: 12/22/2023  Attending Provider: Cindy Garnette POUR, MD  Primary Diagnosis: AKI (acute kidney injury) [facility])   Level of care: Telemetry   OVERNIGHT PROGRESS REPORT   Notified earlier tonight that patient was experiencing persistent nausea and vomiting while receiving tube feeding. Nausea and vomiting continued despite earlier administration of Zofran , Compazine , and Tigan .  RN spoke with surgeon on-call who recommended holding tube feeds.    Jullie Arps, DNP, ACNPC- AG [facility] [place]

## 2024-01-13 NOTE — Progress Notes (Signed)
 PHARMACY - TOTAL PARENTERAL NUTRITION CONSULT NOTE   Indication: Pancreatic mass with duodenal stricture/obstruction  Patient Measurements: Height: 5' 11 (180.3 cm) Weight: 81.5 kg (179 lb 9.7 oz) IBW/kg (Calculated) : 70.8 TPN AdjBW (KG): 81.9 Body mass index is 25.05 kg/m. Usual Weight: 92.5 kg in April of this year  Assessment:  Pharmacy consulted to manage TPN for  53 yo needing nutritional support while awaiting surgical intervention for pancreatic mass with duodenal stricture/obstruction in the setting of metastatic breast cancer resulting in poor oral intake, persistent nausea and vomiting, and weight loss.  7/22 Recent repeated episodes of large volume emesis. Patient reportedly agreeable to plan for gastrojejunostomy tube for drainage of the stomach and downstream tube feeds and now NPO for surgery. 7/23 Two noted emesis events early AM.   Glucose / Insulin :  - No Hx DM.  - BG remains < 150.  - SSI has been stopped.   Electrolytes:  - Corr Ca is 8.0, low - Others are WNL today  Renal: SCr and BUN WNL  Hepatic: albumin  low but stable at 2.6; alk phos elevated abut lower at 772; transaminases and bilirubin remain WNL; triglycerides elevated at 274.  Intake / Output:  NS at 75 ml;/hr.  GI Imaging: 7/4 MR abdomen MRCP: pancreatic mass, vertebral metastases, pancolonic diverticulosis 7/10 CT: pancreatic head equivocal fullness, small amount free pelvic fluid, widespread osseous metastatic disease, small hiatal hernia, colonic diverticulosis GI Surgeries / Procedures:  7/8 EGD 7/7 EUS, upper GI tract 7/14: bypass surgery for creation of G-Junostomy 7/22: Insertion of gastrostomy tube   Central access: PICC placed 7/9 TPN start date: 7/9  Nutritional Goals:  Goal TPN rate is 90 mL/hr (provides 104 g of protein and 2238 kcals per day)  RD Assessment: Estimated Needs Total Energy Estimated Needs: 2000-2300kcal/day Total Protein Estimated Needs: 100-115g/day Total  Fluid Estimated Needs: >/= 2L  Current Nutrition:  TPN at 90 mL/hr NPO  Plan: Now - Calcium  gluconate 1 gr IV x1   Discussed with Almarie Pringle, PA , continue half-rate TPN rate on 7/25 with  intolerance to TF    TPN with lipids to 3mL/hr at 1800 Electrolytes in TPN:  -Na 150 mEq/L (max) -K inc to 50  mEq/L -Ca 10 mEq/L (max)  -Mg 8 mEq/L -Phos 25 mmol/L ( max)  -Cl:Ac 1:1 Add standard MVI and trace elements to TPN Monitor TPN labs on Mon/Thurs, and as needed BMP, magnesium , phosphorus with AM labs      Dolphus Roller, PharmD, BCPS 01/13/2024 11:19 AM

## 2024-01-13 NOTE — Plan of Care (Signed)
  Problem: Education: Goal: Knowledge of General Education information will improve Description: Including pain rating scale, medication(s)/side effects and non-pharmacologic comfort measures Outcome: Progressing   Problem: Health Behavior/Discharge Planning: Goal: Ability to manage health-related needs will improve Outcome: Progressing   Problem: Clinical Measurements: Goal: Ability to maintain clinical measurements within normal limits will improve Outcome: Progressing Goal: Will remain free from infection Outcome: Progressing Goal: Diagnostic test results will improve Outcome: Progressing Goal: Respiratory complications will improve Outcome: Progressing Goal: Cardiovascular complication will be avoided Outcome: Progressing   Problem: Coping: Goal: Level of anxiety will decrease Outcome: Progressing   Problem: Elimination: Goal: Will not experience complications related to urinary retention Outcome: Progressing   Problem: Pain Managment: Goal: General experience of comfort will improve and/or be controlled Outcome: Progressing   Problem: Safety: Goal: Ability to remain free from injury will improve Outcome: Progressing   Problem: Skin Integrity: Goal: Risk for impaired skin integrity will decrease Outcome: Progressing   Problem: Activity: Goal: Risk for activity intolerance will decrease Outcome: Not Progressing   Problem: Nutrition: Goal: Adequate nutrition will be maintained Outcome: Not Progressing   Problem: Elimination: Goal: Will not experience complications related to bowel motility Outcome: Not Progressing

## 2024-01-14 DIAGNOSIS — N179 Acute kidney failure, unspecified: Secondary | ICD-10-CM | POA: Diagnosis not present

## 2024-01-14 DIAGNOSIS — G893 Neoplasm related pain (acute) (chronic): Secondary | ICD-10-CM

## 2024-01-14 LAB — MAGNESIUM: Magnesium: 2.5 mg/dL — ABNORMAL HIGH (ref 1.7–2.4)

## 2024-01-14 LAB — BASIC METABOLIC PANEL WITH GFR
Anion gap: 6 (ref 5–15)
BUN: 15 mg/dL (ref 6–20)
CO2: 21 mmol/L — ABNORMAL LOW (ref 22–32)
Calcium: 7 mg/dL — ABNORMAL LOW (ref 8.9–10.3)
Chloride: 111 mmol/L (ref 98–111)
Creatinine, Ser: 0.5 mg/dL (ref 0.44–1.00)
GFR, Estimated: >60 mL/min (ref >60)
Glucose, Bld: 131 mg/dL — ABNORMAL HIGH (ref 70–99)
Potassium: 4.1 mmol/L (ref 3.5–5.1)
Sodium: 138 mmol/L (ref 135–145)

## 2024-01-14 LAB — GLUCOSE, CAPILLARY
Glucose-Capillary: 120 mg/dL — ABNORMAL HIGH (ref 70–99)
Glucose-Capillary: 125 mg/dL — ABNORMAL HIGH (ref 70–99)
Glucose-Capillary: 131 mg/dL — ABNORMAL HIGH (ref 70–99)
Glucose-Capillary: 131 mg/dL — ABNORMAL HIGH (ref 70–99)
Glucose-Capillary: 143 mg/dL — ABNORMAL HIGH (ref 70–99)

## 2024-01-14 LAB — PHOSPHORUS: Phosphorus: 2.5 mg/dL (ref 2.5–4.6)

## 2024-01-14 MED ORDER — CALCIUM GLUCONATE-NACL 1-0.675 GM/50ML-% IV SOLN
1.0000 g | Freq: Once | INTRAVENOUS | Status: AC
  Administered 2024-01-14: 1000 mg via INTRAVENOUS
  Filled 2024-01-14: qty 50

## 2024-01-14 MED ORDER — FENTANYL CITRATE PF 50 MCG/ML IJ SOSY
25.0000 ug | PREFILLED_SYRINGE | Freq: Once | INTRAMUSCULAR | Status: AC
  Administered 2024-01-14: 25 ug via INTRAVENOUS
  Filled 2024-01-14: qty 1

## 2024-01-14 MED ORDER — FENTANYL CITRATE PF 50 MCG/ML IJ SOSY
25.0000 ug | PREFILLED_SYRINGE | INTRAMUSCULAR | Status: DC | PRN
  Administered 2024-01-14 – 2024-01-17 (×9): 25 ug via INTRAVENOUS
  Filled 2024-01-14 (×9): qty 1

## 2024-01-14 MED ORDER — SODIUM CHLORIDE 0.9 % IV BOLUS
500.0000 mL | Freq: Once | INTRAVENOUS | Status: AC
  Administered 2024-01-14: 500 mL via INTRAVENOUS

## 2024-01-14 MED ORDER — TRAVASOL 10 % IV SOLN
INTRAVENOUS | Status: AC
  Filled 2024-01-14: qty 518.4

## 2024-01-14 MED ORDER — DEXAMETHASONE SODIUM PHOSPHATE 4 MG/ML IJ SOLN
4.0000 mg | Freq: Two times a day (BID) | INTRAMUSCULAR | Status: DC
  Administered 2024-01-14 – 2024-01-17 (×7): 4 mg via INTRAVENOUS
  Filled 2024-01-14 (×7): qty 1

## 2024-01-14 NOTE — Progress Notes (Signed)
 4 Days Post-Op   Subjective/Chief Complaint: BM yesterday, Gtube with 700 drained over last 24h, bilious. Jtube feeds held again due to nausea, bloating and pain.   Objective: Vital signs in last 24 hours: Temp:  [97.5 F (36.4 C)-99.2 F (37.3 C)] 99.1 F (37.3 C) (07/26 0635) Pulse Rate:  [81-122] 117 (07/26 0635) Resp:  [17-20] 20 (07/26 0635) BP: (87-118)/(64-80) 118/80 (07/26 0821) SpO2:  [96 %-100 %] 96 % (07/26 0635) Weight:  [86.6 kg] 86.6 kg (07/26 0500) Last BM Date : 01/12/24  Intake/Output from previous day: 07/25 0701 - 07/26 0700 In: 2351.2 [P.O.:60; I.V.:1733.2; NG/GT:400; IV Piggyback:158] Out: 700 [Drains:700] Intake/Output this shift: Total I/O In: 100 [NG/GT:100] Out: -   Abdomen soft and moderately tender, incisions clean dry intact with glue G tube to gravity with bilious drainage  J tube with TF off   Lab Results:  Recent Labs    01/12/24 0330 01/13/24 0437  WBC 4.2 4.2  HGB 7.9* 7.9*  HCT 25.5* 25.0*  PLT 178 177   BMET Recent Labs    01/13/24 0437 01/14/24 0118  NA 137 138  K 3.8 4.1  CL 110 111  CO2 20* 21*  GLUCOSE 113* 131*  BUN 16 15  CREATININE 0.38* 0.50  CALCIUM  6.9* 7.0*   PT/INR No results for input(s): LABPROT, INR in the last 72 hours. ABG No results for input(s): PHART, HCO3 in the last 72 hours.  Invalid input(s): PCO2, PO2  Studies/Results: No results found.     Anti-infectives: Anti-infectives (From admission, onward)    Start     Dose/Rate Route Frequency Ordered Stop   01/13/24 1545  erythromycin  (EES) 400 MG/5ML suspension 400 mg        400 mg Per Tube Every 12 hours 01/13/24 1455     01/10/24 0600  ceFAZolin  (ANCEF ) IVPB 2g/100 mL premix        2 g 200 mL/hr over 30 Minutes Intravenous On call to O.R. 01/09/24 1324 01/10/24 0948   01/02/24 1245  ceFAZolin  (ANCEF ) 2-4 GM/100ML-% IVPB       Note to Pharmacy: Kathern Quant: cabinet override      01/02/24 1245 01/03/24 0059    12/22/23 2230  fluconazole  (DIFLUCAN ) IVPB 200 mg  Status:  Discontinued        200 mg 100 mL/hr over 60 Minutes Intravenous Every 24 hours 12/22/23 2132 01/03/24 1126       Assessment/Plan: Pancreatic mass causing duodenal obstruction/stricture  POD 11- S/P LAPAROSCOPIC CREATION GASTRJEJONOSTOMY, 7/14, Dr. Lyndel  POD 3 - S/P UPPER ENDORSCOPY, LAPAROSCOPY G TUBE INSERTION, LAPAROSCOPIC J TUBE INSTERTION 7/22 Dr. Stevie - continue G tube to gravity - patient states nausea/vomiting better when tube feeds were stopped, attempted to restart at trickle yesterday but ran into same issues, so would hold feeds today no crushed meds per J tube, only liquids. - continue TPN given intolerance of TF - Was consider CT scan today but given only POD 4, will hold off. May consider scanning tomorrow.  Pancreatic mass causing duodenal obstruction  -CA 19-9 is 465 - EUS 7/7 w/ bx that were nonspecific.   Metastatic breast cancer w/ mets to bone    FEN - CLD as tolerated VTE - ok for chemical prophylaxis, SCDs HTN HLD protein calorie nutrition - PICC, TPN   I reviewed hospitalist notes, last 24 h vitals and pain scores, last 48 h intake and output, last 24 h labs and trends, and last 24 h imaging results.  Richerd Silversmith, MD  01/14/2024

## 2024-01-14 NOTE — Progress Notes (Signed)
 Patient with history of metastatic breast cancer with known osseous mets found to have concern for pancreatic mass. Unable to reach mass by EUS due to duodenal obstruction. IR has been consulted for possible biopsy.  History and imaging reviewed by Dr. Luverne who notes no other identifiable area for biopsy, IR does not perform pancreas biopsies. He notes that this does not appear to be a definite mass by imaging and recommends a follow up MRI as well a follow up GI and hepatobiliary surgery.  No procedure planned in IR. Hospitalist team made aware via Epic chat.  Please call on call IR MD with questions or concerns.  Clotilda Hesselbach, PA-C

## 2024-01-14 NOTE — Plan of Care (Signed)

## 2024-01-14 NOTE — Progress Notes (Signed)
 Progress Note   Patient: Chelsea Hansen FMW:[id number] DOB: 1971-01-16 DOA: 12/22/2023     22 DOS: the patient was seen and examined on 01/14/2024   Brief hospital course: 53 y.o. 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   Imaging showed a duodenal obstruction and masslike area of the pancreas.  GI and General Surgery consulted.     EUS attempted on 7/7, but unsuccessful secondary to duodenal stenosis.     Repeat EGD/EUS on 7/9 again unsuccessful in biopsy.  Gen surg consulted and NG tube was placed, patient started on TPN, patient subsequently underwent laparoscopic creation of gastrojejunostomy for duodenal stricture.  Patient started on clear liquids and diet advanced to full liquid diet. -Patient with ongoing nausea vomiting and followed by general surgery. -Patient to undergo gastrojejunostomy tube tube insertion for drainage of the stomach and J-tube downstream for tube feeds today, 01/10/2024.  Assessment and Plan: #1 pancreatic mass with duodenal stricture/SBO in the setting of pancreatic mass/??  Ileus -Patient noted to have a new mass noted on imaging of the pancreas noted on CT abdomen and pelvis. - Patient underwent MRCP which was concerning for expansive, masslike appearance of the pancreatic head and uncinate as noted on prior CT measuring 2.5 x 2.3 cm which was noted to be new and interval to prior restaging PET/CT dated 06/10/2023 and highly concerning for pancreatic adenocarcinoma. -CA 19-9 noted at 465. - Patient seen in consultation by GI however unfortunately EUS which was attempted on 7 7 was unsuccessful secondary to duodenal stenosis with repeat EGD/EUS done on 7 /9 noted to be unsuccessful and unable to get a biopsy. - General Surgery was consulted and patient underwent a gastrojejunal bypass surgery on 7/14 with no concerning nodules or evidence of metastatic  disease on diagnostic laparoscopy. - Patient noted to have tolerated full liquid diet on 01/03/2024 however around 8 PM noted to had episode of emesis. -Patient noted with emesis the morning of 01/05/2024 and has been having emesis at least twice daily with last episode of emesis early on this morning, 01/09/2024.    -Per general surgery upper GI on 7/18 without complication.   - Patient still with ongoing nausea and emesis with gastrojejunostomy tube for drainage of stomach and downstream tube for feeds which was placed 7/22 -consideration for re-attempt at EGD EUS for further evaluation of pancreatic mass in 8 weeks - Discontinued fentanyl  PCA pump, Dilaudid , and oxycodone  given intolerance due to BP and lethargy -Palliative Care consulted to assist with symptom management -Currently maintained on 650mg  tylenol . Follow LFT trends -on trial of Tigan  for nausea -Continues with nausea and abd pain. Trickle feed on hold per surgery, possible f/u CT tomorrow.  - will discuss case with GI as pt remains symptomatic   2.  Hypertension - Continue to hold home antihypertensive medications.  - received 2 units prbcs on 7/22 for hgb 6.1, remained stable afterwards -bp stable   3.  Moderate protein calorie malnutrition - On TPN.   - Attempted trickle feed through J-tube, however pt did not tolerate secondary to nasuea   4.  Candida esophagitis - Status post full course fluconazole .    5.  Chronic asthma -Stable.   6.  AKI on CKD stage 3a - Patient noted to have a creatinine of 3.2 on admission which improved with hydration and resolved, creatinine currently at 0.34.   7.  Hypophosphatemia/hypomagnesemia/hypomagnesemia -Being repleted  per pharmacy as patient on TPN.   8.  Chronic anemia -Patient with no overt bleeding. - Hemoglobin at 6.1 from 8.5 from 7.4 after transfusion 1 unit PRBCs on 01/06/2024. -Hgb down to 6.1 on 7/22. Given 2 more units PRBC's    9.  Dizziness/lightheadedness/soft  blood pressure/hypotension -Patient noted to have an episode of nausea and abdominal pain after drinking soup in the afternoon on 01/07/2024.  - Patient given IV Dilaudid , IV Compazine  and noted to have transient unresponsiveness of less than 1 minute and subsequently improved.  Repeat vitals at that time noted with initial blood pressure of 110/66 with blood pressure dropping down into the high 80s to low 90s - IV Dilaudid  discontinued per above.   - Supportive care.       Subjective: Continues to feel nauseated  Physical Exam: Vitals:   01/14/24 0821 01/14/24 1059 01/14/24 1116 01/14/24 1349  BP: 118/80 96/68  97/66  Pulse:   (!) 111 (!) 107  Resp:   18 19  Temp:   98.8 F (37.1 C) 98.4 F (36.9 C)  TempSrc:   Oral Oral  SpO2:   96% 99%  Weight:      Height:       General exam: Conversant, in no acute distress Respiratory system: normal chest rise, clear, no audible wheezing Cardiovascular system: regular rhythm, s1-s2 Gastrointestinal system: Nondistended, jtube  Central nervous system: No seizures, no tremors Extremities: No cyanosis, no joint deformities Skin: No rashes, no pallor Psychiatry: Affect normal // mood seems normal  Data Reviewed:  Labs reviewed: Na 138, K 4.1, Cr 0.50   Family Communication: Pt in room, family at bedside  Disposition: Status is: Inpatient Remains inpatient appropriate because: severity of illness  Planned Discharge Destination: Home    Author: Garnette Pelt, MD 01/14/2024 6:17 PM  For on call review [email or web address].

## 2024-01-14 NOTE — Progress Notes (Signed)
 Daily Progress Note   Patient Name: Chelsea Hansen       Date: 01/14/2024 DOB: 1970-12-16  Age: 53 y.o. MRN#: [id number] Attending Physician: Cindy Garnette POUR, MD Primary Care Physician: [name], NP Admit Date: 12/22/2023  Reason for Consultation/Follow-up: Establishing goals of care  Subjective: Medical records reviewed including progress notes, labs, and imaging, MAR. She has received 4 doses of PRN Oxycodone , 3 doses of PRN dilaudid , 1 dose of PRN Fentanyl  in the past 24 hours. She continues to have nausea and vomiting. Of note, she refused Zyprexa  last night despite ODS formulation.  Patient assessed at the bedside.  She is very sleepy, having a difficult time staying awake for conversation.  Reports continued nausea and severe pain.  Provided education on her Zyprexa , encouraging her to take it tonight as this dissolves for sublingual absorption.  She nodded her understanding.  I explored her thoughts on the long-acting pain control options including fentanyl  patch.  She states she will think about this.  Discussed with primary attending and RN.  Received an update later on from RN that patient continued to have pain 10/10, hypotension limiting ability to give IV opioids.  Adjusted symptom management medications as below.  Questions and concerns addressed. PMT will continue to support holistically.   Length of Stay: 22   Physical Exam Vitals and nursing note reviewed.  Constitutional:      Appearance: She is ill-appearing.  HENT:     Head: Normocephalic and atraumatic.  Cardiovascular:     Rate and Rhythm: Tachycardia present.  Pulmonary:     Effort: Pulmonary effort is normal.  Neurological:     Mental Status: She is lethargic.             Vital Signs: BP 118/80   Pulse (!) 117   Temp 99.1 F (37.3 C) (Oral)    Resp 20   Ht 5' 11 (1.803 m)   Wt 86.6 kg   SpO2 96%   BMI 26.63 kg/m  SpO2: SpO2: 96 % O2 Device: O2 Device: Room Air O2 Flow Rate: O2 Flow Rate (L/min): 0 L/min      Palliative Assessment/Data: TBD   Palliative Care Assessment & Plan   Patient Profile: 53 y.o. 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  She presented to the ED on 12/22/2023 with abdominal pain and vomiting.  Imaging showed a duodenal obstruction and pancreatic mass.  GI and general surgery consulted.     EUS attempted on 7/7, but was unsuccessful  secondary to duodenal stenosis. Repeat EGD/EUS on 7/9 was again unsuccessful in biopsy.  On 7/14, patient underwent a gastrojejunal bypass surgery with no concerning nodules or evidence of metastatic disease on diagnostic laparoscopy.   Palliative Medicine was consulted for symptom management in the setting of ongoing nausea, vomiting, and abdominal pain.  Assessment: Goals of care conversation Pancreatic mass with elevated CA 19-9  Duodenal stricture/SBO Metastatic breast cancer AKI on CKD3a Intractable nausea and vomiting   Recommendations/Plan: Continue full code/full scope treatment Ordered Dexamethasone  4mg  IV Q12H for cancer pain and nausea Discontinued Dilaudid  IV due to hypotension and lethargy Ordered Fentanyl  25mcg Q3H PRN for severe pain Consult IR for potential celiac block for acute pain related to pancreas mass, likely next week at earliest Continue Zyprexa  5mg  ODS at bedtime Continue Oxycodone  10mg  per tube Q4H PRN Goal is to return home and continue cancer treatment PMT will continue to follow and support   Prognosis: Poor  Discharge Planning: Home with Home Health  Care plan was discussed with Patient, RN, attending/hospitalist    MDM High         Lynnleigh Soden SHAUNNA Fell, PA-C  Palliative Medicine Team Team phone #  [phone]  Thank you for allowing the Palliative Medicine Team to assist in the care of this patient. Please utilize secure chat with additional questions, if there is no response within 30 minutes please call the above phone number.  Palliative Medicine Team providers are available by phone from 7am to 7pm daily and can be reached through the team cell phone.  Should this patient require assistance outside of these hours, please call the patient's attending physician.

## 2024-01-14 NOTE — Progress Notes (Signed)
 Oncology S: Complaining of being extremely drowsy and she also threw up this morning and has persistent nausea.    01/14/2024    8:21 AM 01/14/2024    6:35 AM 01/14/2024    5:00 AM  Vitals with BMI  Weight   190 lbs 15 oz  BMI   26.64  Systolic 118 107   Diastolic 80 64   Pulse  117       Latest Ref Rng & Units 01/13/2024    4:37 AM 01/12/2024    3:30 AM 01/11/2024    2:10 AM  CBC  WBC 4.0 - 10.5 K/uL 4.2  4.2  5.0   Hemoglobin 12.0 - 15.0 g/dL 7.9  7.9  8.2   Hematocrit 36.0 - 46.0 % 25.0  25.5  25.9   Platelets 150 - 400 K/uL 177  178  179       Latest Ref Rng & Units 01/14/2024    1:18 AM 01/13/2024    4:37 AM 01/12/2024    3:30 AM  CMP  Glucose 70 - 99 mg/dL 868  886  879   BUN 6 - 20 mg/dL 15  16  12    Creatinine 0.44 - 1.00 mg/dL 9.49  9.61  9.66   Sodium 135 - 145 mmol/L 138  137  133   Potassium 3.5 - 5.1 mmol/L 4.1  3.8  3.8   Chloride 98 - 111 mmol/L 111  110  106   CO2 22 - 32 mmol/L 21  20  20    Calcium  8.9 - 10.3 mg/dL 7.0  6.9  6.5   Total Protein 6.5 - 8.1 g/dL  6.1  6.1   Total Bilirubin 0.0 - 1.2 mg/dL  0.6  0.7   Alkaline Phos 38 - 126 U/L  772  841   AST 15 - 41 U/L  25  27   ALT 0 - 44 U/L  22  25    Intractable nausea and vomiting: Zyprexa  was added yesterday.  I discussed with her that sometimes pain medications may also cause additional nausea.  Unfortunately she is complaining of 10 out of 10 abdominal pain intermittently. Pancreatic mass with elevated CA 19-9: Concern for primary pancreatic cancer.  I spoke with Dr. Cindy, who will once again touch base with gastroenterology and interventional radiology if there is any other option to get a tissue diagnosis. We discussed with surgery who believe that the gastrojejunostomy can take several weeks to months to improve her symptoms. If no other option is available for tissue diagnosis, we will need to investigate options to transfer her to an academic institution. Goals of care: Appreciate palliative care  and assisting with this.  She continues to want to be aggressive and figure out a way to diagnose and treat if this was truly pancreatic cancer. Metastatic breast cancer: We are holding Verzenio .  Bone metastases.   Unfortunately I do not have any additional recommendations at this time.

## 2024-01-14 NOTE — Progress Notes (Signed)
 PHARMACY - TOTAL PARENTERAL NUTRITION CONSULT NOTE   Indication: Pancreatic mass with duodenal stricture/obstruction  Patient Measurements: Height: 5' 11 (180.3 cm) Weight: 86.6 kg (190 lb 14.7 oz) IBW/kg (Calculated) : 70.8 TPN AdjBW (KG): 81.9 Body mass index is 26.63 kg/m. Usual Weight: 92.5 kg in April of this year  Assessment:  Pharmacy consulted to manage TPN for  53 yo needing nutritional support while awaiting surgical intervention for pancreatic mass with duodenal stricture/obstruction in the setting of metastatic breast cancer resulting in poor oral intake, persistent nausea and vomiting, and weight loss.  S/p venting Gtube placement on 7/22 with plans for Jtube feedings.    Glucose / Insulin :  No Hx DM. BG remains < 150.  - SSI has been stopped.   Electrolytes: Elytes WNL, except CorrCa 8.1, Co2 low at 21 Renal: SCr and BUN WNL  Hepatic: albumin  low but stable at 2.6; alk phos elevated abut lower at 772; transaminases and bilirubin remain WNL; triglycerides elevated at 274.  Intake / Output:  - mIVF: NS at 75 ml/hr.  - Output:  drains 700 mL, emesis not recorded, stool x1, urine x2 GI Imaging: 7/4 MR abdomen MRCP: pancreatic mass, vertebral metastases, pancolonic diverticulosis 7/10 CT: pancreatic head equivocal fullness, small amount free pelvic fluid, widespread osseous metastatic disease, small hiatal hernia, colonic diverticulosis GI Surgeries / Procedures:  7/8 EGD 7/7 EUS, upper GI tract 7/14: bypass surgery for creation of G-Junostomy 7/22: Insertion of gastrostomy tube   Central access: PICC placed 7/9 TPN start date: 7/9  Nutritional Goals:  Goal TPN rate is 90 mL/hr (provides 104 g of protein and 2238 kcals per day)  RD Assessment: Estimated Needs Total Energy Estimated Needs: 2000-2300kcal/day Total Protein Estimated Needs: 100-115g/day Total Fluid Estimated Needs: >/= 2L  Current Nutrition:  TPN at 90 mL/hr Diet: CLD (7/22) Tube feeds:   Osmolite, Prosource.  Remains on hold for N/V  Plan: Now:  Calcium  gluconate 1 gr IV x1   At 18:00: Continue half-rate TPN at 45mL/hr Electrolytes in TPN:   Na 150 mEq/L (max), K 45 mEq/L, Ca 10 mEq/L (max), Mg 3 mEq/L, Phos 25 mmol/L ( max).  Cl:Ac 1:2 Add standard MVI and trace elements to TPN Monitor TPN labs on Mon/Thurs, and as needed.    Wanda Hasting PharmD, BCPS [facility] [phone] 01/14/2024 8:43 AM

## 2024-01-14 NOTE — Progress Notes (Signed)
 Per patient, all contact to family is to go through son Curtistine [phone]) or daughter Dena [phone]). If other family members or friends call, information can be obtained through [place].

## 2024-01-14 NOTE — Progress Notes (Signed)
 Pt with nausea this morning. Vomited of bile. PRN IV compazine  given. Pt reports 10/10 abdominal pain. PRN Oxy per tube given in addition to scheduled tylenol . BP 118/80 (MAP 93).  Pt mom called while nurse in room. Mother, Lahoma, updated via speaker phone.   Primary RN updated. MD made aware.

## 2024-01-15 ENCOUNTER — Inpatient Hospital Stay (HOSPITAL_COMMUNITY): Payer: MEDICAID

## 2024-01-15 LAB — CBC
HCT: 22.6 % — ABNORMAL LOW (ref 36.0–46.0)
Hemoglobin: 7.1 g/dL — ABNORMAL LOW (ref 12.0–15.0)
MCH: 23.8 pg — ABNORMAL LOW (ref 26.0–34.0)
MCHC: 31.4 g/dL (ref 30.0–36.0)
MCV: 75.8 fL — ABNORMAL LOW (ref 80.0–100.0)
Platelets: 176 K/uL (ref 150–400)
RBC: 2.98 MIL/uL — ABNORMAL LOW (ref 3.87–5.11)
RDW: 18.6 % — ABNORMAL HIGH (ref 11.5–15.5)
WBC: 4.5 K/uL (ref 4.0–10.5)
nRBC: 1.1 % — ABNORMAL HIGH (ref 0.0–0.2)

## 2024-01-15 LAB — GLUCOSE, CAPILLARY
Glucose-Capillary: 114 mg/dL — ABNORMAL HIGH (ref 70–99)
Glucose-Capillary: 121 mg/dL — ABNORMAL HIGH (ref 70–99)
Glucose-Capillary: 121 mg/dL — ABNORMAL HIGH (ref 70–99)
Glucose-Capillary: 124 mg/dL — ABNORMAL HIGH (ref 70–99)
Glucose-Capillary: 131 mg/dL — ABNORMAL HIGH (ref 70–99)
Glucose-Capillary: 138 mg/dL — ABNORMAL HIGH (ref 70–99)

## 2024-01-15 LAB — BASIC METABOLIC PANEL WITH GFR
Anion gap: 7 (ref 5–15)
BUN: 15 mg/dL (ref 6–20)
CO2: 21 mmol/L — ABNORMAL LOW (ref 22–32)
Calcium: 6.9 mg/dL — ABNORMAL LOW (ref 8.9–10.3)
Chloride: 109 mmol/L (ref 98–111)
Creatinine, Ser: 0.43 mg/dL — ABNORMAL LOW (ref 0.44–1.00)
GFR, Estimated: >60 mL/min (ref >60)
Glucose, Bld: 140 mg/dL — ABNORMAL HIGH (ref 70–99)
Potassium: 4.1 mmol/L (ref 3.5–5.1)
Sodium: 137 mmol/L (ref 135–145)

## 2024-01-15 LAB — MAGNESIUM: Magnesium: 2.3 mg/dL (ref 1.7–2.4)

## 2024-01-15 LAB — PHOSPHORUS: Phosphorus: 2 mg/dL — ABNORMAL LOW (ref 2.5–4.6)

## 2024-01-15 MED ORDER — IOHEXOL 9 MG/ML PO SOLN: 500.0000 mL | ORAL | Status: AC

## 2024-01-15 MED ORDER — CALCIUM GLUCONATE-NACL 1-0.675 GM/50ML-% IV SOLN
1.0000 g | Freq: Once | INTRAVENOUS | Status: AC
  Administered 2024-01-15: 1000 mg via INTRAVENOUS
  Filled 2024-01-15: qty 50

## 2024-01-15 MED ORDER — IOHEXOL 300 MG/ML  SOLN
100.0000 mL | Freq: Once | INTRAMUSCULAR | Status: AC | PRN
  Administered 2024-01-15: 100 mL via INTRAVENOUS

## 2024-01-15 MED ORDER — TRAVASOL 10 % IV SOLN
INTRAVENOUS | Status: AC
  Filled 2024-01-15: qty 518.4

## 2024-01-15 MED ORDER — SODIUM PHOSPHATES 45 MMOLE/15ML IV SOLN
30.0000 mmol | Freq: Once | INTRAVENOUS | Status: AC
  Administered 2024-01-15: 30 mmol via INTRAVENOUS
  Filled 2024-01-15: qty 10

## 2024-01-15 NOTE — Progress Notes (Signed)
 Progress Note   Patient: Chelsea Hansen FMW:[id number] DOB: 1970/09/10 DOA: 12/22/2023     23 DOS: the patient was seen and examined on 01/15/2024   Brief hospital course: 53 y.o. 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   Imaging showed a duodenal obstruction and masslike area of the pancreas.  GI and General Surgery consulted.     EUS attempted on 7/7, but unsuccessful secondary to duodenal stenosis.     Repeat EGD/EUS on 7/9 again unsuccessful in biopsy.  Gen surg consulted and NG tube was placed, patient started on TPN, patient subsequently underwent laparoscopic creation of gastrojejunostomy for duodenal stricture.  Patient started on clear liquids and diet advanced to full liquid diet. -Patient with ongoing nausea vomiting and followed by general surgery. -Patient to undergo gastrojejunostomy tube tube insertion for drainage of the stomach and J-tube downstream for tube feeds today, 01/10/2024.  Assessment and Plan: #1 pancreatic mass with duodenal stricture/SBO in the setting of pancreatic mass/??  Ileus -Patient noted to have a new mass noted on imaging of the pancreas noted on CT abdomen and pelvis. - Patient underwent MRCP which was concerning for expansive, masslike appearance of the pancreatic head and uncinate as noted on prior CT measuring 2.5 x 2.3 cm which was noted to be new and interval to prior restaging PET/CT dated 06/10/2023 and highly concerning for pancreatic adenocarcinoma. -CA 19-9 noted at 465. - Patient seen in consultation by GI however unfortunately EUS which was attempted on 7 7 was unsuccessful secondary to duodenal stenosis with repeat EGD/EUS done on 7 /9 noted to be unsuccessful and unable to get a biopsy. - General Surgery was consulted and patient underwent a gastrojejunal bypass surgery on 7/14 with no concerning nodules or evidence of metastatic  disease on diagnostic laparoscopy. - Patient noted to have tolerated full liquid diet on 01/03/2024 however around 8 PM noted to had episode of emesis. -Patient noted with emesis the morning of 01/05/2024 and has been having emesis at least twice daily with last episode of emesis early on this morning, 01/09/2024.    -Per general surgery upper GI on 7/18 without complication.   - Patient still with ongoing nausea and emesis with gastrojejunostomy tube for drainage of stomach and downstream tube for feeds which was placed 7/22 -consideration for re-attempt at EGD EUS for further evaluation of pancreatic mass in 8 weeks - Discontinued fentanyl  PCA pump, Dilaudid , and oxycodone  given intolerance due to BP and lethargy -Palliative Care consulted to assist with symptom management -Currently maintained on 650mg  tylenol . Follow LFT trends -on trial of Tigan  for nausea -Continues with nausea and abd pain. Trickle feed on hold per surgery, -f/u CT abd ordered by General Surgery is pending  -Discussed case with GI who will formally re-evaluate patient 7/28   2.  Hypertension - Continue to hold home antihypertensive medications.  - received 2 units prbcs on 7/22 for hgb 6.1, remained stable afterwards -bp stable   3.  Moderate protein calorie malnutrition - On TPN.   - Attempted trickle feed through J-tube, however pt did not tolerate secondary to nasuea   4.  Candida esophagitis - Status post full course fluconazole .    5.  Chronic asthma -Stable.   6.  AKI on CKD stage 3a - Patient noted to have a creatinine of 3.2 on admission which improved with hydration and resolved, creatinine currently at 0.34.  7.  Hypophosphatemia/hypomagnesemia/hypomagnesemia -Being repleted per pharmacy as patient on TPN.   8.  Chronic anemia -Patient with no overt bleeding. - Hemoglobin at 6.1 from 8.5 from 7.4 after transfusion 1 unit PRBCs on 01/06/2024. -Hgb down to 6.1 on 7/22. Given 2 more units PRBC's     9.  Dizziness/lightheadedness/soft blood pressure/hypotension -Patient noted to have an episode of nausea and abdominal pain after drinking soup in the afternoon on 01/07/2024.  - Patient given IV Dilaudid , IV Compazine  and noted to have transient unresponsiveness of less than 1 minute and subsequently improved.  Repeat vitals at that time noted with initial blood pressure of 110/66 with blood pressure dropping down into the high 80s to low 90s - IV Dilaudid  discontinued per above.   - Supportive care.       Subjective: Still feeling nauseated this AM  Physical Exam: Vitals:   01/14/24 1349 01/14/24 2000 01/15/24 0420 01/15/24 1343  BP: 97/66 108/75 112/70 131/85  Pulse: (!) 107 (!) 108 86 93  Resp: 19   18  Temp: 98.4 F (36.9 C) 99.6 F (37.6 C) 99.3 F (37.4 C) 98.5 F (36.9 C)  TempSrc: Oral Oral Oral Oral  SpO2: 99% 97% 97% 96%  Weight:   88.6 kg   Height:       General exam: Awake, laying in bed, in nad Respiratory system: Normal respiratory effort, no wheezing Cardiovascular system: regular rate, s1, s2 Gastrointestinal system: Soft, nondistended, positive BS Central nervous system: CN2-12 grossly intact, strength intact Extremities: Perfused, no clubbing Skin: Normal skin turgor, no notable skin lesions seen Psychiatry: Mood normal // no visual hallucinations   Data Reviewed:  Labs reviewed: Na 137, K 4.1, Cr 0.43, WBC 4.5, Hgb 7.1, Plts 176   Family Communication: Pt in room, family not at bedside  Disposition: Status is: Inpatient Remains inpatient appropriate because: severity of illness  Planned Discharge Destination: Home    Author: Garnette Pelt, MD 01/15/2024 5:53 PM  For on call review [email or web address].

## 2024-01-15 NOTE — Progress Notes (Signed)
 5 Days Post-Op   Subjective/Chief Complaint: No nausea today, Jtube feeds held all day yesterday   Objective: Vital signs in last 24 hours: Temp:  [98.4 F (36.9 C)-99.6 F (37.6 C)] 99.3 F (37.4 C) (07/27 0420) Pulse Rate:  [86-111] 86 (07/27 0420) Resp:  [18-19] 19 (07/26 1349) BP: (96-112)/(66-75) 112/70 (07/27 0420) SpO2:  [96 %-99 %] 97 % (07/27 0420) Weight:  [88.6 kg] 88.6 kg (07/27 0420) Last BM Date : 01/12/24  Intake/Output from previous day: 07/26 0701 - 07/27 0700 In: 1708.5 [P.O.:240; I.V.:1203.5; NG/GT:100] Out: 2640 [Urine:1900; Emesis/NG output:150; Drains:590] Intake/Output this shift: No intake/output data recorded.  Abdomen soft and moderately tender, incisions clean dry intact with glue G tube to gravity with bilious drainage  J tube with TF off   Lab Results:  Recent Labs    01/13/24 0437 01/15/24 0306  WBC 4.2 4.5  HGB 7.9* 7.1*  HCT 25.0* 22.6*  PLT 177 176   BMET Recent Labs    01/14/24 0118 01/15/24 0306  NA 138 137  K 4.1 4.1  CL 111 109  CO2 21* 21*  GLUCOSE 131* 140*  BUN 15 15  CREATININE 0.50 0.43*  CALCIUM  7.0* 6.9*   PT/INR No results for input(s): LABPROT, INR in the last 72 hours. ABG No results for input(s): PHART, HCO3 in the last 72 hours.  Invalid input(s): PCO2, PO2  Studies/Results: No results found.     Anti-infectives: Anti-infectives (From admission, onward)    Start     Dose/Rate Route Frequency Ordered Stop   01/13/24 1545  erythromycin  (EES) 400 MG/5ML suspension 400 mg        400 mg Per Tube Every 12 hours 01/13/24 1455     01/10/24 0600  ceFAZolin  (ANCEF ) IVPB 2g/100 mL premix        2 g 200 mL/hr over 30 Minutes Intravenous On call to O.R. 01/09/24 1324 01/10/24 0948   01/02/24 1245  ceFAZolin  (ANCEF ) 2-4 GM/100ML-% IVPB       Note to Pharmacy: Kathern Quant: cabinet override      01/02/24 1245 01/03/24 0059   12/22/23 2230  fluconazole  (DIFLUCAN ) IVPB 200 mg  Status:   Discontinued        200 mg 100 mL/hr over 60 Minutes Intravenous Every 24 hours 12/22/23 2132 01/03/24 1126       Assessment/Plan: Pancreatic mass causing duodenal obstruction/stricture  POD 11- S/P LAPAROSCOPIC CREATION GASTRJEJONOSTOMY, 7/14, Dr. Lyndel  POD 3 - S/P UPPER ENDORSCOPY, LAPAROSCOPY G TUBE INSERTION, LAPAROSCOPIC J TUBE INSTERTION 7/22 Dr. Stevie - continue G tube to gravity - continue to hold J tube feeds - Will order CT with PO contrast to be administered through Gtube and Jtube no crushed meds per J tube, only liquids. - continue TPN given intolerance of TF  Pancreatic mass causing duodenal obstruction  -CA 19-9 is 465 - EUS 7/7 w/ bx that were nonspecific.   Metastatic breast cancer w/ mets to bone    FEN - CLD as tolerated VTE - ok for chemical prophylaxis, SCDs HTN HLD protein calorie nutrition - PICC, TPN   I reviewed hospitalist notes, last 24 h vitals and pain scores, last 48 h intake and output, last 24 h labs and trends, and last 24 h imaging results.   Richerd Silversmith, MD  01/15/2024

## 2024-01-15 NOTE — Progress Notes (Signed)
 Daily Progress Note   Patient Name: Chelsea Hansen       Date: 01/15/2024 DOB: March 15, 1971  Age: 53 y.o. MRN#: [id number] Attending Physician: Cindy Garnette POUR, MD Primary Care Physician: [name], NP Admit Date: 12/22/2023  Reason for Consultation/Follow-up: Establishing goals of care  Subjective: Medical records reviewed including progress notes, labs, and imaging, MAR. She has received 2 doses of PRN Oxycodone  and 1 dose of PRN Fentanyl  in the past 24 hours. Discussed with RN.  Patient assessed at the bedside.  She is more alert this morning, reports feeling better. We discussed current plan including steroids for pain and nausea - she feels that his has helped. She does not want to take Zyprexa  and insists that she has not taken this, although she reported relief and improvement 2 days ago after receiving the first dose.   Review option of celiac block and rationale, potential benefits. Though she is hesitant she would be agreeable to discussing with IR if possible. No other needs at this time.  Questions and concerns addressed. PMT will continue to support holistically.   Length of Stay: 23   Physical Exam Vitals and nursing note reviewed.  Constitutional:      General: She is not in acute distress.    Appearance: She is ill-appearing.  HENT:     Head: Normocephalic and atraumatic.  Cardiovascular:     Rate and Rhythm: Normal rate.  Pulmonary:     Effort: Pulmonary effort is normal.  Neurological:     Mental Status: She is alert.             Vital Signs: BP 112/70 (BP Location: Left Arm)   Pulse 86   Temp 99.3 F (37.4 C) (Oral)   Resp 19   Ht 5' 11 (1.803 m)   Wt 88.6 kg   SpO2 97%   BMI 27.24 kg/m  SpO2: SpO2: 97 % O2 Device: O2 Device: Room Air O2 Flow Rate: O2 Flow Rate (L/min): 0 L/min       Palliative Assessment/Data: TBD   Palliative Care Assessment & Plan   Patient Profile: 53 y.o. 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  She presented to the ED on 12/22/2023 with abdominal pain and vomiting.  Imaging showed a duodenal obstruction and pancreatic mass.  GI and general surgery consulted.     EUS attempted on 7/7, but was unsuccessful secondary to duodenal stenosis. Repeat EGD/EUS on 7/9 was again unsuccessful in biopsy.  On 7/14, patient underwent a  gastrojejunal bypass surgery with no concerning nodules or evidence of metastatic disease on diagnostic laparoscopy.   Palliative Medicine was consulted for symptom management in the setting of ongoing nausea, vomiting, and abdominal pain.  Assessment: Goals of care conversation Pancreatic mass with elevated CA 19-9  Duodenal stricture/SBO Metastatic breast cancer Intractable nausea and vomiting   Recommendations/Plan: Continue Dexamethasone  4mg  IV Q12H for cancer pain and nausea Continue Fentanyl  25mcg IV Q3H PRN for severe pain Continue Oxycodone  10mg  per tube Q4H PRN Discontinue Zyprexa  5mg  ODS at bedtime per patient's request PMT will continue to follow and support as needed   Prognosis: Poor  Discharge Planning: Home with Home Health  Care plan was discussed with Patient, RN    MDM High         Demyah Smyre SHAUNNA Fell, PA-C  Palliative Medicine Team Team phone # [phone]  Thank you for allowing the Palliative Medicine Team to assist in the care of this patient. Please utilize secure chat with additional questions, if there is no response within 30 minutes please call the above phone number.  Palliative Medicine Team providers are available by phone from 7am to 7pm daily and can be reached through the team cell phone.  Should this patient require assistance outside of these hours, please call  the patient's attending physician.

## 2024-01-15 NOTE — Progress Notes (Signed)
 PHARMACY - TOTAL PARENTERAL NUTRITION CONSULT NOTE   Indication: Pancreatic mass with duodenal stricture/obstruction  Patient Measurements: Height: 5' 11 (180.3 cm) Weight: 88.6 kg (195 lb 5.2 oz) IBW/kg (Calculated) : 70.8 TPN AdjBW (KG): 81.9 Body mass index is 27.24 kg/m. Usual Weight: 92.5 kg in April of this year  Assessment:  Pharmacy consulted to manage TPN for  53 yo needing nutritional support while awaiting surgical intervention for pancreatic mass with duodenal stricture/obstruction in the setting of metastatic breast cancer resulting in poor oral intake, persistent nausea and vomiting, and weight loss.  S/p venting Gtube placement on 7/22 with plans for Jtube feedings.    Glucose / Insulin :  No Hx DM. BG remains < 150.  - SSI has been stopped - Dexamethasone  4mg  IV q12h (7/26 >> ) Electrolytes: Elytes WNL, except CorrCa 8.0, Phos low at 2, Co2 low at 21 Renal: SCr and BUN WNL  Hepatic: albumin  low but stable at 2.6; alk phos elevated abut lower at 772; transaminases and bilirubin remain WNL; triglycerides elevated at 274.  Intake / Output:  - mIVF: NS at 75 ml/hr.  - Output:  drains 590 mL, emesis 150 mL, stool x1, urine 1900 mL GI Imaging: 7/4 MR abdomen MRCP: pancreatic mass, vertebral metastases, pancolonic diverticulosis 7/10 CT: pancreatic head equivocal fullness, small amount free pelvic fluid, widespread osseous metastatic disease, small hiatal hernia, colonic diverticulosis GI Surgeries / Procedures:  7/8 EGD 7/7 EUS, upper GI tract 7/14: bypass surgery for creation of G-Junostomy 7/22: Insertion of gastrostomy tube   Central access: PICC placed 7/9 TPN start date: 7/9  Nutritional Goals:  Goal TPN rate is 90 mL/hr (provides 104 g of protein and 2238 kcals per day)  RD Assessment: Estimated Needs Total Energy Estimated Needs: 2000-2300kcal/day Total Protein Estimated Needs: 100-115g/day Total Fluid Estimated Needs: >/= 2L  Current Nutrition:  TPN at  goal rate Diet: CLD (7/22) Tube feeds:  Osmolite, Prosource.  Remains on hold for N/V  Plan: Now:  Calcium  gluconate 1g IV x1  NaPhos 30 mmol IV x1  At 18:00: Continue half-rate TPN at 45mL/hr Electrolytes in TPN:   Na 150 mEq/L (max), K 45 mEq/L, Ca 10 mEq/L (max), Mg 3 mEq/L, Phos 25 mmol/L ( max).  Cl:Ac 1:2 Add standard MVI and trace elements to TPN Monitor TPN labs on Mon/Thurs, and as needed. Follow up ability to tolerate enteral feeds.      Wanda Hasting PharmD, BCPS [facility] main pharmacy [phone] 01/15/2024 8:11 AM

## 2024-01-15 NOTE — Plan of Care (Signed)

## 2024-01-16 ENCOUNTER — Inpatient Hospital Stay (HOSPITAL_COMMUNITY): Payer: MEDICAID

## 2024-01-16 DIAGNOSIS — R978 Other abnormal tumor markers: Secondary | ICD-10-CM

## 2024-01-16 DIAGNOSIS — R112 Nausea with vomiting, unspecified: Secondary | ICD-10-CM | POA: Diagnosis not present

## 2024-01-16 DIAGNOSIS — K315 Obstruction of duodenum: Secondary | ICD-10-CM | POA: Diagnosis not present

## 2024-01-16 DIAGNOSIS — R933 Abnormal findings on diagnostic imaging of other parts of digestive tract: Secondary | ICD-10-CM

## 2024-01-16 LAB — COMPREHENSIVE METABOLIC PANEL WITH GFR
ALT: 23 U/L (ref 0–44)
AST: 26 U/L (ref 15–41)
Albumin: 2.3 g/dL — ABNORMAL LOW (ref 3.5–5.0)
Alkaline Phosphatase: 703 U/L — ABNORMAL HIGH (ref 38–126)
Anion gap: 7 (ref 5–15)
BUN: 11 mg/dL (ref 6–20)
CO2: 22 mmol/L (ref 22–32)
Calcium: 7.2 mg/dL — ABNORMAL LOW (ref 8.9–10.3)
Chloride: 107 mmol/L (ref 98–111)
Creatinine, Ser: 0.41 mg/dL — ABNORMAL LOW (ref 0.44–1.00)
GFR, Estimated: >60 mL/min (ref >60)
Glucose, Bld: 120 mg/dL — ABNORMAL HIGH (ref 70–99)
Potassium: 3.8 mmol/L (ref 3.5–5.1)
Sodium: 136 mmol/L (ref 135–145)
Total Bilirubin: 0.9 mg/dL (ref 0.0–1.2)
Total Protein: 5.8 g/dL — ABNORMAL LOW (ref 6.5–8.1)

## 2024-01-16 LAB — CBC
HCT: 23.2 % — ABNORMAL LOW (ref 36.0–46.0)
Hemoglobin: 7.2 g/dL — ABNORMAL LOW (ref 12.0–15.0)
MCH: 23.3 pg — ABNORMAL LOW (ref 26.0–34.0)
MCHC: 31 g/dL (ref 30.0–36.0)
MCV: 75.1 fL — ABNORMAL LOW (ref 80.0–100.0)
Platelets: 182 K/uL (ref 150–400)
RBC: 3.09 MIL/uL — ABNORMAL LOW (ref 3.87–5.11)
RDW: 18.6 % — ABNORMAL HIGH (ref 11.5–15.5)
WBC: 6.6 K/uL (ref 4.0–10.5)
nRBC: 1.7 % — ABNORMAL HIGH (ref 0.0–0.2)

## 2024-01-16 LAB — GLUCOSE, CAPILLARY
Glucose-Capillary: 126 mg/dL — ABNORMAL HIGH (ref 70–99)
Glucose-Capillary: 110 mg/dL — ABNORMAL HIGH (ref 70–99)
Glucose-Capillary: 110 mg/dL — ABNORMAL HIGH (ref 70–99)
Glucose-Capillary: 125 mg/dL — ABNORMAL HIGH (ref 70–99)
Glucose-Capillary: 125 mg/dL — ABNORMAL HIGH (ref 70–99)

## 2024-01-16 LAB — TRIGLYCERIDES: Triglycerides: 99 mg/dL (ref <150)

## 2024-01-16 LAB — MAGNESIUM: Magnesium: 1.9 mg/dL (ref 1.7–2.4)

## 2024-01-16 LAB — PHOSPHORUS: Phosphorus: 2.5 mg/dL (ref 2.5–4.6)

## 2024-01-16 MED ORDER — OSMOLITE 1.5 CAL PO LIQD: 1000.0000 mL | ORAL | Status: DC

## 2024-01-16 MED ORDER — OSMOLITE 1.5 CAL PO LIQD
1000.0000 mL | ORAL | Status: DC
  Administered 2024-01-16: 1000 mL via JEJUNOSTOMY
  Filled 2024-01-16 (×2): qty 1000

## 2024-01-16 MED ORDER — SODIUM CHLORIDE 0.9 % IV SOLN: INTRAVENOUS | Status: DC

## 2024-01-16 MED ORDER — FENTANYL CITRATE (PF) 100 MCG/2ML IJ SOLN
INTRAMUSCULAR | Status: AC
Start: 2024-01-16 — End: 2024-01-16
  Filled 2024-01-16: qty 2

## 2024-01-16 MED ORDER — MIDAZOLAM HCL 2 MG/2ML IJ SOLN
INTRAMUSCULAR | Status: AC
  Filled 2024-01-16: qty 4

## 2024-01-16 MED ORDER — BUPIVACAINE HCL (PF) 0.5 % IJ SOLN
INTRAMUSCULAR | Status: AC | PRN
  Administered 2024-01-16: 10 mL

## 2024-01-16 MED ORDER — FENTANYL CITRATE (PF) 100 MCG/2ML IJ SOLN
INTRAMUSCULAR | Status: AC | PRN
  Administered 2024-01-16 (×2): 50 ug via INTRAVENOUS

## 2024-01-16 MED ORDER — BUPIVACAINE HCL (PF) 0.5 % IJ SOLN
INTRAMUSCULAR | Status: AC
  Filled 2024-01-16: qty 30

## 2024-01-16 MED ORDER — SODIUM CHLORIDE 0.9 % IV SOLN: INTRAVENOUS | Status: AC

## 2024-01-16 MED ORDER — TRAVASOL 10 % IV SOLN
INTRAVENOUS | Status: AC
  Filled 2024-01-16: qty 1036.8

## 2024-01-16 MED ORDER — MIDAZOLAM HCL 2 MG/2ML IJ SOLN
INTRAMUSCULAR | Status: AC | PRN
Start: 2024-01-16 — End: 2024-01-16
  Administered 2024-01-16 (×2): 1 mg via INTRAVENOUS

## 2024-01-16 NOTE — Consult Note (Signed)
 Consultation  Referring Provider:   [facility] Primary Care Physician:  [name], NP Primary Gastroenterologist:  Dr. Legrand (Dr. Aneita)      Reason for Consultation:     Reconsult for nausea, vomiting, abdominal pain DOA: 12/22/2023         Hospital Day: 26         HPI:   Chelsea Hansen is a 53 y.o. female with past medical history significant for metastatic breast cancer with diffuse osseous disease, chronic kidney disease stage IIIa, history of SVT, asthma, hypertension, hyperlipidemia, pancreatic mass, duodenal stricture and recent hospitalization with intractable nausea vomiting found to have esophagitis, esophageal stricture, gastritis, duodenitis and duodenal stricture status post balloon dilation to 12 mm on 12/13/2023, readmission 12/23/2023 with ongoing nausea, vomiting abdominal discomfort and weight loss associated transaminitis.    EUS attempted 7/7 unsuccessful secondary duodenal stenosis 7/9 repeat EGD EUS unable to get biopsy, cannot transverse duodenal stenosis.  EUS with no specific mass.  Biopsy nondiagnostic 7/14 gastrojejunal bypass surgery no concerning nodules or evidence of metastatic disease 7/18 upper GI showed expected postoperative changes no evidence of contrast leak, gastric emptying not assessed esophagus grossly normal esophageal motility unremarkable no GERD 7/27 CT abdomen pelvis with contrast mildly dilated jejunal loops centrally without transition point near present ileus or partial small bowel obstruction, small amount of free fluid abdomen pelvis, small bilateral pleural fusions diffuse body wall edema, right sided hydronephrosis without obstructing calculus, distended bladder diverticulosis and sclerotic osseous metastatic disease  Our service was reconsulted secondary to ongoing nausea and vomiting despite gastrojejunal bypass and GJ tubes, patient unable to tolerate feeds.  Patient has been started on Reglan  and erythromycin  without help. This  morning when he went into the room patient had leaking yellow-green possibly slightly purulent bile from possibly to your J-tube appeared to be more from J-tube. She has been complaining of nausea vomiting whenever she has jejunal feeding.  Denies hematemesis.  Will have abdominal distention abdominal discomfort and vomiting afterwards. Also states she has been having diarrhea at least 3 times a day more watery with gas.  Complains of abdominal distention with some tenderness around the J-tube.   Last episode of diarrhea was yesterday she denies melena or hematochezia. Denies fevers but states she has been having chills.  No family was present at the time of my evaluation.  Abnormal ED labs: Abnormal Labs Reviewed  CBC WITH DIFFERENTIAL/PLATELET - Abnormal; Notable for the following components:      Result Value   RBC 5.28 (*)    MCV 73.5 (*)    MCH 22.9 (*)    Abs Immature Granulocytes 0.24 (*)    All other components within normal limits  COMPREHENSIVE METABOLIC PANEL WITH GFR - Abnormal; Notable for the following components:   Potassium 2.9 (*)    Chloride 91 (*)    Glucose, Bld 143 (*)    BUN 53 (*)    Creatinine, Ser 3.23 (*)    Calcium  7.0 (*)    Total Protein 9.1 (*)    AST 139 (*)    ALT 307 (*)    Alkaline Phosphatase 351 (*)    GFR, Estimated 17 (*)    Anion gap 19 (*)    All other components within normal limits  MAGNESIUM  - Abnormal; Notable for the following components:   Magnesium  2.8 (*)    All other components within normal limits  PROTIME-INR - Abnormal; Notable for the following components:  Prothrombin Time 15.5 (*)    All other components within normal limits  LIPASE, BLOOD - Abnormal; Notable for the following components:   Lipase 52 (*)    All other components within normal limits  BASIC METABOLIC PANEL WITH GFR - Abnormal; Notable for the following components:   Potassium 3.0 (*)    CO2 21 (*)    Glucose, Bld 115 (*)    BUN 44 (*)    Creatinine,  Ser 2.20 (*)    Calcium  6.5 (*)    GFR, Estimated 26 (*)    All other components within normal limits  ACETAMINOPHEN  LEVEL - Abnormal; Notable for the following components:   Acetaminophen  (Tylenol ), Serum <10 (*)    All other components within normal limits  OSMOLALITY - Abnormal; Notable for the following components:   Osmolality 302 (*)    All other components within normal limits  MAGNESIUM  - Abnormal; Notable for the following components:   Magnesium  2.5 (*)    All other components within normal limits  COMPREHENSIVE METABOLIC PANEL WITH GFR - Abnormal; Notable for the following components:   Potassium 2.6 (*)    Chloride 97 (*)    Glucose, Bld 109 (*)    BUN 44 (*)    Creatinine, Ser 2.13 (*)    Calcium  7.1 (*)    Albumin  3.4 (*)    AST 106 (*)    ALT 240 (*)    Alkaline Phosphatase 320 (*)    GFR, Estimated 28 (*)    All other components within normal limits  CBC - Abnormal; Notable for the following components:   Hemoglobin 9.9 (*)    HCT 32.0 (*)    MCV 74.8 (*)    MCH 23.1 (*)    All other components within normal limits  COMPREHENSIVE METABOLIC PANEL WITH GFR - Abnormal; Notable for the following components:   Potassium 3.1 (*)    Glucose, Bld 101 (*)    Calcium  7.5 (*)    Total Protein 6.3 (*)    Albumin  2.9 (*)    AST 83 (*)    ALT 157 (*)    Alkaline Phosphatase 292 (*)    All other components within normal limits  PHOSPHORUS - Abnormal; Notable for the following components:   Phosphorus 2.2 (*)    All other components within normal limits  CBC - Abnormal; Notable for the following components:   RBC 3.74 (*)    Hemoglobin 8.5 (*)    HCT 27.7 (*)    MCV 74.1 (*)    MCH 22.7 (*)    All other components within normal limits  COMPREHENSIVE METABOLIC PANEL WITH GFR - Abnormal; Notable for the following components:   Potassium 3.1 (*)    CO2 20 (*)    Glucose, Bld 102 (*)    Calcium  8.0 (*)    Total Protein 6.4 (*)    Albumin  2.9 (*)    AST 51 (*)     ALT 117 (*)    Alkaline Phosphatase 285 (*)    All other components within normal limits  MAGNESIUM  - Abnormal; Notable for the following components:   Magnesium  1.5 (*)    All other components within normal limits  PHOSPHORUS - Abnormal; Notable for the following components:   Phosphorus 2.4 (*)    All other components within normal limits  CBC - Abnormal; Notable for the following components:   RBC 3.61 (*)    Hemoglobin 8.5 (*)    HCT 26.8 (*)  MCV 74.2 (*)    MCH 23.5 (*)    All other components within normal limits  COMPREHENSIVE METABOLIC PANEL WITH GFR - Abnormal; Notable for the following components:   CO2 19 (*)    Calcium  7.3 (*)    Albumin  3.0 (*)    ALT 95 (*)    Alkaline Phosphatase 291 (*)    All other components within normal limits  PHOSPHORUS - Abnormal; Notable for the following components:   Phosphorus 1.7 (*)    All other components within normal limits  CBC - Abnormal; Notable for the following components:   RBC 3.72 (*)    Hemoglobin 8.9 (*)    HCT 28.4 (*)    MCV 76.3 (*)    MCH 23.9 (*)    RDW 15.7 (*)    All other components within normal limits  COMPREHENSIVE METABOLIC PANEL WITH GFR - Abnormal; Notable for the following components:   Potassium 3.3 (*)    CO2 18 (*)    BUN 5 (*)    Calcium  6.9 (*)    Total Protein 6.2 (*)    Albumin  2.9 (*)    ALT 73 (*)    Alkaline Phosphatase 289 (*)    All other components within normal limits  CANCER ANTIGEN 19-9 - Abnormal; Notable for the following components:   CA 19-9 465 (*)    All other components within normal limits  PREALBUMIN - Abnormal; Notable for the following components:   Prealbumin 13 (*)    All other components within normal limits  COMPREHENSIVE METABOLIC PANEL WITH GFR - Abnormal; Notable for the following components:   CO2 18 (*)    Glucose, Bld 126 (*)    Calcium  7.5 (*)    Total Protein 6.4 (*)    Albumin  3.0 (*)    ALT 67 (*)    Alkaline Phosphatase 318 (*)    All  other components within normal limits  MAGNESIUM  - Abnormal; Notable for the following components:   Magnesium  1.4 (*)    All other components within normal limits  PHOSPHORUS - Abnormal; Notable for the following components:   Phosphorus 1.7 (*)    All other components within normal limits  COMPREHENSIVE METABOLIC PANEL WITH GFR - Abnormal; Notable for the following components:   Sodium 134 (*)    Potassium 2.9 (*)    Glucose, Bld 129 (*)    Calcium  6.9 (*)    Total Protein 6.3 (*)    Albumin  3.0 (*)    AST 76 (*)    ALT 116 (*)    Alkaline Phosphatase 333 (*)    All other components within normal limits  PHOSPHORUS - Abnormal; Notable for the following components:   Phosphorus 1.8 (*)    All other components within normal limits  CBC - Abnormal; Notable for the following components:   RBC 3.54 (*)    Hemoglobin 8.3 (*)    HCT 25.9 (*)    MCV 73.2 (*)    MCH 23.4 (*)    All other components within normal limits  GLUCOSE, CAPILLARY - Abnormal; Notable for the following components:   Glucose-Capillary 131 (*)    All other components within normal limits  GLUCOSE, CAPILLARY - Abnormal; Notable for the following components:   Glucose-Capillary 137 (*)    All other components within normal limits  GLUCOSE, CAPILLARY - Abnormal; Notable for the following components:   Glucose-Capillary 123 (*)    All other components within normal limits  GLUCOSE, CAPILLARY - Abnormal; Notable for the following components:   Glucose-Capillary 140 (*)    All other components within normal limits  TRIGLYCERIDES - Abnormal; Notable for the following components:   Triglycerides 267 (*)    All other components within normal limits  GLUCOSE, CAPILLARY - Abnormal; Notable for the following components:   Glucose-Capillary 128 (*)    All other components within normal limits  BASIC METABOLIC PANEL WITH GFR - Abnormal; Notable for the following components:   Potassium 3.2 (*)    Glucose, Bld 113 (*)     Calcium  6.8 (*)    All other components within normal limits  PHOSPHORUS - Abnormal; Notable for the following components:   Phosphorus 2.3 (*)    All other components within normal limits  CBC - Abnormal; Notable for the following components:   RBC 3.61 (*)    Hemoglobin 8.2 (*)    HCT 26.8 (*)    MCV 74.2 (*)    MCH 22.7 (*)    All other components within normal limits  GLUCOSE, CAPILLARY - Abnormal; Notable for the following components:   Glucose-Capillary 116 (*)    All other components within normal limits  GLUCOSE, CAPILLARY - Abnormal; Notable for the following components:   Glucose-Capillary 115 (*)    All other components within normal limits  GLUCOSE, CAPILLARY - Abnormal; Notable for the following components:   Glucose-Capillary 130 (*)    All other components within normal limits  GLUCOSE, CAPILLARY - Abnormal; Notable for the following components:   Glucose-Capillary 133 (*)    All other components within normal limits  BASIC METABOLIC PANEL WITH GFR - Abnormal; Notable for the following components:   Glucose, Bld 116 (*)    Calcium  6.8 (*)    All other components within normal limits  MAGNESIUM  - Abnormal; Notable for the following components:   Magnesium  1.6 (*)    All other components within normal limits  CBC - Abnormal; Notable for the following components:   RBC 3.60 (*)    Hemoglobin 8.3 (*)    HCT 26.8 (*)    MCV 74.4 (*)    MCH 23.1 (*)    RDW 15.7 (*)    All other components within normal limits  GLUCOSE, CAPILLARY - Abnormal; Notable for the following components:   Glucose-Capillary 121 (*)    All other components within normal limits  GLUCOSE, CAPILLARY - Abnormal; Notable for the following components:   Glucose-Capillary 126 (*)    All other components within normal limits  GLUCOSE, CAPILLARY - Abnormal; Notable for the following components:   Glucose-Capillary 117 (*)    All other components within normal limits  GLUCOSE, CAPILLARY -  Abnormal; Notable for the following components:   Glucose-Capillary 110 (*)    All other components within normal limits  BASIC METABOLIC PANEL WITH GFR - Abnormal; Notable for the following components:   Sodium 134 (*)    Glucose, Bld 133 (*)    Calcium  7.2 (*)    All other components within normal limits  CBC - Abnormal; Notable for the following components:   RBC 3.70 (*)    Hemoglobin 8.7 (*)    HCT 27.8 (*)    MCV 75.1 (*)    MCH 23.5 (*)    RDW 15.7 (*)    nRBC 0.4 (*)    All other components within normal limits  GLUCOSE, CAPILLARY - Abnormal; Notable for the following components:   Glucose-Capillary 132 (*)  All other components within normal limits  GLUCOSE, CAPILLARY - Abnormal; Notable for the following components:   Glucose-Capillary 124 (*)    All other components within normal limits  GLUCOSE, CAPILLARY - Abnormal; Notable for the following components:   Glucose-Capillary 139 (*)    All other components within normal limits  COMPREHENSIVE METABOLIC PANEL WITH GFR - Abnormal; Notable for the following components:   CO2 21 (*)    Glucose, Bld 133 (*)    Calcium  7.9 (*)    Total Protein 6.1 (*)    Albumin  2.8 (*)    ALT 83 (*)    Alkaline Phosphatase 529 (*)    All other components within normal limits  TRIGLYCERIDES - Abnormal; Notable for the following components:   Triglycerides 261 (*)    All other components within normal limits  CBC - Abnormal; Notable for the following components:   RBC 3.73 (*)    Hemoglobin 8.8 (*)    HCT 28.1 (*)    MCV 75.3 (*)    MCH 23.6 (*)    RDW 15.9 (*)    nRBC 0.4 (*)    All other components within normal limits  IRON AND TIBC - Abnormal; Notable for the following components:   TIBC 190 (*)    Saturation Ratios 44 (*)    All other components within normal limits  FERRITIN - Abnormal; Notable for the following components:   Ferritin 921 (*)    All other components within normal limits  GLUCOSE, CAPILLARY - Abnormal;  Notable for the following components:   Glucose-Capillary 124 (*)    All other components within normal limits  GLUCOSE, CAPILLARY - Abnormal; Notable for the following components:   Glucose-Capillary 139 (*)    All other components within normal limits  GLUCOSE, CAPILLARY - Abnormal; Notable for the following components:   Glucose-Capillary 151 (*)    All other components within normal limits  RETIC PANEL - Abnormal; Notable for the following components:   RBC. 3.42 (*)    Immature Retic Fract 18.8 (*)    Reticulocyte Hemoglobin 21.1 (*)    All other components within normal limits  GLUCOSE, CAPILLARY - Abnormal; Notable for the following components:   Glucose-Capillary 163 (*)    All other components within normal limits  COMPREHENSIVE METABOLIC PANEL WITH GFR - Abnormal; Notable for the following components:   Sodium 133 (*)    Glucose, Bld 267 (*)    Calcium  8.3 (*)    Albumin  3.1 (*)    ALT 57 (*)    Alkaline Phosphatase 556 (*)    All other components within normal limits  PHOSPHORUS - Abnormal; Notable for the following components:   Phosphorus 1.5 (*)    All other components within normal limits  GLUCOSE, CAPILLARY - Abnormal; Notable for the following components:   Glucose-Capillary 264 (*)    All other components within normal limits  GLUCOSE, CAPILLARY - Abnormal; Notable for the following components:   Glucose-Capillary 262 (*)    All other components within normal limits  CBC WITH DIFFERENTIAL/PLATELET - Abnormal; Notable for the following components:   RBC 3.47 (*)    Hemoglobin 7.9 (*)    HCT 26.1 (*)    MCV 75.2 (*)    MCH 22.8 (*)    RDW 16.1 (*)    Abs Immature Granulocytes 0.59 (*)    All other components within normal limits  GLUCOSE, CAPILLARY - Abnormal; Notable for the following components:   Glucose-Capillary 239 (*)  All other components within normal limits  GLUCOSE, CAPILLARY - Abnormal; Notable for the following components:    Glucose-Capillary 234 (*)    All other components within normal limits  GLUCOSE, CAPILLARY - Abnormal; Notable for the following components:   Glucose-Capillary 242 (*)    All other components within normal limits  COMPREHENSIVE METABOLIC PANEL WITH GFR - Abnormal; Notable for the following components:   Sodium 133 (*)    Glucose, Bld 119 (*)    Calcium  8.0 (*)    Total Protein 6.3 (*)    Albumin  3.0 (*)    Alkaline Phosphatase 580 (*)    All other components within normal limits  MAGNESIUM  - Abnormal; Notable for the following components:   Magnesium  1.5 (*)    All other components within normal limits  PHOSPHORUS - Abnormal; Notable for the following components:   Phosphorus 2.3 (*)    All other components within normal limits  GLUCOSE, CAPILLARY - Abnormal; Notable for the following components:   Glucose-Capillary 212 (*)    All other components within normal limits  GLUCOSE, CAPILLARY - Abnormal; Notable for the following components:   Glucose-Capillary 113 (*)    All other components within normal limits  GLUCOSE, CAPILLARY - Abnormal; Notable for the following components:   Glucose-Capillary 112 (*)    All other components within normal limits  COMPREHENSIVE METABOLIC PANEL WITH GFR - Abnormal; Notable for the following components:   Glucose, Bld 123 (*)    Calcium  7.8 (*)    Total Protein 6.0 (*)    Albumin  2.8 (*)    Alkaline Phosphatase 673 (*)    All other components within normal limits  GLUCOSE, CAPILLARY - Abnormal; Notable for the following components:   Glucose-Capillary 124 (*)    All other components within normal limits  CBC WITH DIFFERENTIAL/PLATELET - Abnormal; Notable for the following components:   RBC 3.20 (*)    Hemoglobin 7.4 (*)    HCT 24.0 (*)    MCV 75.0 (*)    MCH 23.1 (*)    RDW 15.9 (*)    nRBC 0.4 (*)    Abs Immature Granulocytes 0.41 (*)    All other components within normal limits  GLUCOSE, CAPILLARY - Abnormal; Notable for the  following components:   Glucose-Capillary 119 (*)    All other components within normal limits  GLUCOSE, CAPILLARY - Abnormal; Notable for the following components:   Glucose-Capillary 134 (*)    All other components within normal limits  GLUCOSE, CAPILLARY - Abnormal; Notable for the following components:   Glucose-Capillary 126 (*)    All other components within normal limits  GLUCOSE, CAPILLARY - Abnormal; Notable for the following components:   Glucose-Capillary 132 (*)    All other components within normal limits  GLUCOSE, CAPILLARY - Abnormal; Notable for the following components:   Glucose-Capillary 125 (*)    All other components within normal limits  BASIC METABOLIC PANEL WITH GFR - Abnormal; Notable for the following components:   Sodium 134 (*)    Glucose, Bld 141 (*)    Calcium  7.8 (*)    All other components within normal limits  CBC - Abnormal; Notable for the following components:   RBC 3.27 (*)    Hemoglobin 7.6 (*)    HCT 24.7 (*)    MCV 75.5 (*)    MCH 23.2 (*)    RDW 16.1 (*)    nRBC 0.4 (*)    All other components within normal limits  RENAL FUNCTION PANEL - Abnormal; Notable for the following components:   Sodium 134 (*)    Glucose, Bld 140 (*)    Calcium  7.8 (*)    Albumin  2.8 (*)    All other components within normal limits  GLUCOSE, CAPILLARY - Abnormal; Notable for the following components:   Glucose-Capillary 139 (*)    All other components within normal limits  GLUCOSE, CAPILLARY - Abnormal; Notable for the following components:   Glucose-Capillary 135 (*)    All other components within normal limits  GLUCOSE, CAPILLARY - Abnormal; Notable for the following components:   Glucose-Capillary 137 (*)    All other components within normal limits  GLUCOSE, CAPILLARY - Abnormal; Notable for the following components:   Glucose-Capillary 136 (*)    All other components within normal limits  GLUCOSE, CAPILLARY - Abnormal; Notable for the following  components:   Glucose-Capillary 135 (*)    All other components within normal limits  GLUCOSE, CAPILLARY - Abnormal; Notable for the following components:   Glucose-Capillary 128 (*)    All other components within normal limits  CBC - Abnormal; Notable for the following components:   RBC 3.35 (*)    Hemoglobin 8.0 (*)    HCT 25.5 (*)    MCV 76.1 (*)    MCH 23.9 (*)    RDW 16.8 (*)    nRBC 0.5 (*)    All other components within normal limits  BASIC METABOLIC PANEL WITH GFR - Abnormal; Notable for the following components:   Sodium 134 (*)    Potassium 3.4 (*)    Glucose, Bld 127 (*)    Calcium  7.7 (*)    All other components within normal limits  HEMOGLOBIN AND HEMATOCRIT, BLOOD - Abnormal; Notable for the following components:   Hemoglobin 8.7 (*)    HCT 27.4 (*)    All other components within normal limits  GLUCOSE, CAPILLARY - Abnormal; Notable for the following components:   Glucose-Capillary 132 (*)    All other components within normal limits  GLUCOSE, CAPILLARY - Abnormal; Notable for the following components:   Glucose-Capillary 127 (*)    All other components within normal limits  GLUCOSE, CAPILLARY - Abnormal; Notable for the following components:   Glucose-Capillary 126 (*)    All other components within normal limits  GLUCOSE, CAPILLARY - Abnormal; Notable for the following components:   Glucose-Capillary 122 (*)    All other components within normal limits  GLUCOSE, CAPILLARY - Abnormal; Notable for the following components:   Glucose-Capillary 116 (*)    All other components within normal limits  GLUCOSE, CAPILLARY - Abnormal; Notable for the following components:   Glucose-Capillary 130 (*)    All other components within normal limits  CBC - Abnormal; Notable for the following components:   RBC 3.53 (*)    Hemoglobin 8.3 (*)    HCT 27.1 (*)    MCV 76.8 (*)    MCH 23.5 (*)    RDW 16.9 (*)    nRBC 0.6 (*)    All other components within normal limits   BASIC METABOLIC PANEL WITH GFR - Abnormal; Notable for the following components:   Glucose, Bld 115 (*)    Calcium  7.8 (*)    All other components within normal limits  GLUCOSE, CAPILLARY - Abnormal; Notable for the following components:   Glucose-Capillary 131 (*)    All other components within normal limits  BASIC METABOLIC PANEL WITH GFR - Abnormal; Notable for the following components:  CO2 21 (*)    Glucose, Bld 123 (*)    Creatinine, Ser 0.39 (*)    Calcium  7.7 (*)    All other components within normal limits  CBC - Abnormal; Notable for the following components:   RBC 3.25 (*)    Hemoglobin 7.7 (*)    HCT 24.8 (*)    MCV 76.3 (*)    MCH 23.7 (*)    RDW 16.7 (*)    Platelets 147 (*)    nRBC 0.5 (*)    All other components within normal limits  COMPREHENSIVE METABOLIC PANEL WITH GFR - Abnormal; Notable for the following components:   Glucose, Bld 114 (*)    Calcium  8.1 (*)    Total Protein 6.2 (*)    Albumin  2.8 (*)    Alkaline Phosphatase 823 (*)    All other components within normal limits  TRIGLYCERIDES - Abnormal; Notable for the following components:   Triglycerides 274 (*)    All other components within normal limits  CBC WITH DIFFERENTIAL/PLATELET - Abnormal; Notable for the following components:   RBC 3.52 (*)    Hemoglobin 8.5 (*)    HCT 26.9 (*)    MCV 76.4 (*)    MCH 24.1 (*)    RDW 17.4 (*)    nRBC 0.9 (*)    Abs Immature Granulocytes 0.52 (*)    All other components within normal limits  CBC WITH DIFFERENTIAL/PLATELET - Abnormal; Notable for the following components:   WBC 2.8 (*)    RBC 2.66 (*)    Hemoglobin 6.1 (*)    HCT 20.7 (*)    MCV 77.8 (*)    MCH 22.9 (*)    MCHC 29.5 (*)    RDW 17.5 (*)    Platelets 127 (*)    nRBC 1.1 (*)    All other components within normal limits  BASIC METABOLIC PANEL WITH GFR - Abnormal; Notable for the following components:   CO2 18 (*)    Creatinine, Ser 0.34 (*)    Calcium  6.6 (*)    All other  components within normal limits  MAGNESIUM  - Abnormal; Notable for the following components:   Magnesium  1.4 (*)    All other components within normal limits  GLUCOSE, CAPILLARY - Abnormal; Notable for the following components:   Glucose-Capillary 103 (*)    All other components within normal limits  CBC - Abnormal; Notable for the following components:   RBC 3.64 (*)    Hemoglobin 8.6 (*)    HCT 27.7 (*)    MCV 76.1 (*)    MCH 23.6 (*)    RDW 17.5 (*)    nRBC 0.8 (*)    All other components within normal limits  MAGNESIUM  - Abnormal; Notable for the following components:   Magnesium  2.9 (*)    All other components within normal limits  PHOSPHORUS - Abnormal; Notable for the following components:   Phosphorus 1.7 (*)    All other components within normal limits  CBC WITH DIFFERENTIAL/PLATELET - Abnormal; Notable for the following components:   RBC 3.40 (*)    Hemoglobin 8.2 (*)    HCT 25.9 (*)    MCV 76.2 (*)    MCH 24.1 (*)    RDW 17.8 (*)    nRBC 0.4 (*)    Abs Immature Granulocytes 0.22 (*)    All other components within normal limits  RENAL FUNCTION PANEL - Abnormal; Notable for the following components:   Sodium 134 (*)  Potassium 5.2 (*)    CO2 21 (*)    Glucose, Bld 127 (*)    Calcium  7.1 (*)    Phosphorus 1.8 (*)    Albumin  2.9 (*)    All other components within normal limits  GLUCOSE, CAPILLARY - Abnormal; Notable for the following components:   Glucose-Capillary 161 (*)    All other components within normal limits  GLUCOSE, CAPILLARY - Abnormal; Notable for the following components:   Glucose-Capillary 134 (*)    All other components within normal limits  DIFFERENTIAL - Abnormal; Notable for the following components:   Abs Immature Granulocytes 0.40 (*)    All other components within normal limits  GLUCOSE, CAPILLARY - Abnormal; Notable for the following components:   Glucose-Capillary 123 (*)    All other components within normal limits  GLUCOSE,  CAPILLARY - Abnormal; Notable for the following components:   Glucose-Capillary 124 (*)    All other components within normal limits  GLUCOSE, CAPILLARY - Abnormal; Notable for the following components:   Glucose-Capillary 126 (*)    All other components within normal limits  GLUCOSE, CAPILLARY - Abnormal; Notable for the following components:   Glucose-Capillary 107 (*)    All other components within normal limits  COMPREHENSIVE METABOLIC PANEL WITH GFR - Abnormal; Notable for the following components:   Sodium 133 (*)    CO2 20 (*)    Glucose, Bld 120 (*)    Creatinine, Ser 0.33 (*)    Calcium  6.5 (*)    Total Protein 6.1 (*)    Albumin  2.7 (*)    Alkaline Phosphatase 841 (*)    All other components within normal limits  CBC - Abnormal; Notable for the following components:   RBC 3.35 (*)    Hemoglobin 7.9 (*)    HCT 25.5 (*)    MCV 76.1 (*)    MCH 23.6 (*)    RDW 18.3 (*)    nRBC 1.0 (*)    All other components within normal limits  GLUCOSE, CAPILLARY - Abnormal; Notable for the following components:   Glucose-Capillary 117 (*)    All other components within normal limits  GLUCOSE, CAPILLARY - Abnormal; Notable for the following components:   Glucose-Capillary 107 (*)    All other components within normal limits  GLUCOSE, CAPILLARY - Abnormal; Notable for the following components:   Glucose-Capillary 108 (*)    All other components within normal limits  GLUCOSE, CAPILLARY - Abnormal; Notable for the following components:   Glucose-Capillary 120 (*)    All other components within normal limits  GLUCOSE, CAPILLARY - Abnormal; Notable for the following components:   Glucose-Capillary 112 (*)    All other components within normal limits  GLUCOSE, CAPILLARY - Abnormal; Notable for the following components:   Glucose-Capillary 111 (*)    All other components within normal limits  MAGNESIUM  - Abnormal; Notable for the following components:   Magnesium  2.5 (*)    All other  components within normal limits  CBC - Abnormal; Notable for the following components:   RBC 3.28 (*)    Hemoglobin 7.9 (*)    HCT 25.0 (*)    MCV 76.2 (*)    MCH 24.1 (*)    RDW 18.4 (*)    nRBC 1.2 (*)    All other components within normal limits  COMPREHENSIVE METABOLIC PANEL WITH GFR - Abnormal; Notable for the following components:   CO2 20 (*)    Glucose, Bld 113 (*)  Creatinine, Ser 0.38 (*)    Calcium  6.9 (*)    Total Protein 6.1 (*)    Albumin  2.6 (*)    Alkaline Phosphatase 772 (*)    All other components within normal limits  GLUCOSE, CAPILLARY - Abnormal; Notable for the following components:   Glucose-Capillary 109 (*)    All other components within normal limits  GLUCOSE, CAPILLARY - Abnormal; Notable for the following components:   Glucose-Capillary 119 (*)    All other components within normal limits  GLUCOSE, CAPILLARY - Abnormal; Notable for the following components:   Glucose-Capillary 108 (*)    All other components within normal limits  GLUCOSE, CAPILLARY - Abnormal; Notable for the following components:   Glucose-Capillary 119 (*)    All other components within normal limits  GLUCOSE, CAPILLARY - Abnormal; Notable for the following components:   Glucose-Capillary 104 (*)    All other components within normal limits  GLUCOSE, CAPILLARY - Abnormal; Notable for the following components:   Glucose-Capillary 114 (*)    All other components within normal limits  MAGNESIUM  - Abnormal; Notable for the following components:   Magnesium  2.5 (*)    All other components within normal limits  BASIC METABOLIC PANEL WITH GFR - Abnormal; Notable for the following components:   CO2 21 (*)    Glucose, Bld 131 (*)    Calcium  7.0 (*)    All other components within normal limits  GLUCOSE, CAPILLARY - Abnormal; Notable for the following components:   Glucose-Capillary 116 (*)    All other components within normal limits  GLUCOSE, CAPILLARY - Abnormal; Notable for the  following components:   Glucose-Capillary 127 (*)    All other components within normal limits  GLUCOSE, CAPILLARY - Abnormal; Notable for the following components:   Glucose-Capillary 131 (*)    All other components within normal limits  GLUCOSE, CAPILLARY - Abnormal; Notable for the following components:   Glucose-Capillary 125 (*)    All other components within normal limits  GLUCOSE, CAPILLARY - Abnormal; Notable for the following components:   Glucose-Capillary 131 (*)    All other components within normal limits  GLUCOSE, CAPILLARY - Abnormal; Notable for the following components:   Glucose-Capillary 143 (*)    All other components within normal limits  PHOSPHORUS - Abnormal; Notable for the following components:   Phosphorus 2.0 (*)    All other components within normal limits  BASIC METABOLIC PANEL WITH GFR - Abnormal; Notable for the following components:   CO2 21 (*)    Glucose, Bld 140 (*)    Creatinine, Ser 0.43 (*)    Calcium  6.9 (*)    All other components within normal limits  CBC - Abnormal; Notable for the following components:   RBC 2.98 (*)    Hemoglobin 7.1 (*)    HCT 22.6 (*)    MCV 75.8 (*)    MCH 23.8 (*)    RDW 18.6 (*)    nRBC 1.1 (*)    All other components within normal limits  GLUCOSE, CAPILLARY - Abnormal; Notable for the following components:   Glucose-Capillary 120 (*)    All other components within normal limits  GLUCOSE, CAPILLARY - Abnormal; Notable for the following components:   Glucose-Capillary 131 (*)    All other components within normal limits  GLUCOSE, CAPILLARY - Abnormal; Notable for the following components:   Glucose-Capillary 124 (*)    All other components within normal limits  GLUCOSE, CAPILLARY - Abnormal; Notable for the  following components:   Glucose-Capillary 114 (*)    All other components within normal limits  GLUCOSE, CAPILLARY - Abnormal; Notable for the following components:   Glucose-Capillary 121 (*)    All  other components within normal limits  COMPREHENSIVE METABOLIC PANEL WITH GFR - Abnormal; Notable for the following components:   Glucose, Bld 120 (*)    Creatinine, Ser 0.41 (*)    Calcium  7.2 (*)    Total Protein 5.8 (*)    Albumin  2.3 (*)    Alkaline Phosphatase 703 (*)    All other components within normal limits  CBC - Abnormal; Notable for the following components:   RBC 3.09 (*)    Hemoglobin 7.2 (*)    HCT 23.2 (*)    MCV 75.1 (*)    MCH 23.3 (*)    RDW 18.6 (*)    nRBC 1.7 (*)    All other components within normal limits  GLUCOSE, CAPILLARY - Abnormal; Notable for the following components:   Glucose-Capillary 138 (*)    All other components within normal limits  GLUCOSE, CAPILLARY - Abnormal; Notable for the following components:   Glucose-Capillary 121 (*)    All other components within normal limits  GLUCOSE, CAPILLARY - Abnormal; Notable for the following components:   Glucose-Capillary 126 (*)    All other components within normal limits  GLUCOSE, CAPILLARY - Abnormal; Notable for the following components:   Glucose-Capillary 110 (*)    All other components within normal limits  I-STAT CHEM 8, ED - Abnormal; Notable for the following components:   Potassium 3.1 (*)    Chloride 94 (*)    BUN 55 (*)    Creatinine, Ser 3.20 (*)    Glucose, Bld 141 (*)    Calcium , Ion 0.79 (*)    All other components within normal limits    Past Medical History:  Diagnosis Date  . Allergy    seasonal allergies  . Anxiety    on meds  . Asthma    uses inhaler  . Breast cancer (HCC) 2012   RIGHT lumpectomy  . Cancer (HCC) 2022   RIGHT breast lump-dx 2022  . Depression    on meds  . DVT (deep venous thrombosis) (HCC) 2010   after hysterectomy  . Family history of uterine cancer   . GERD (gastroesophageal reflux disease)    with certain foods/OTC PRN meds  . Headache(784.0)   . History of radiation therapy    Bilateral breast- 09/03/21-11/02/21- Dr. Lynwood Nasuti  .  Hyperlipidemia    on meds  . Hypertension    on meds  . SVT (supraventricular tachycardia) (HCC)     Surgical History:  She  has a past surgical history that includes Abdominal hysterectomy; Breast surgery; Tubal ligation; Breast excisional biopsy (Right, 09/16/2009); Portacath placement (Left, 01/26/2021); Breast lumpectomy with radioactive seed localization (Bilateral, 07/09/2021); Radioactive seed guided axillary sentinel lymph node (Right, 07/09/2021); Axillary sentinel node biopsy (Left, 07/09/2021); Port-a-cath removal (Left, 07/09/2021); Esophagogastroduodenoscopy (egd) with propofol  (N/A, 06/29/2022); biopsy (06/29/2022); Esophagogastroduodenoscopy (N/A, 12/13/2023); Balloon dilation (N/A, 12/13/2023); Biopsy of skin subcutaneous tissue and/or mucous membrane (12/13/2023); EUS (N/A, 12/26/2023); Esophagogastroduodenoscopy (N/A, 12/26/2023); Esophagogastroduodenoscopy (N/A, 12/27/2023); EUS (N/A, 12/27/2023); Laparoscopic roux-en-y gastric bypass with hiatal hernia repair (N/A, 01/02/2024); and Laparoscopic insertion gastrostomy tube (N/A, 01/10/2024). Family History:  Her family history includes Breast cancer in her maternal aunt and mother; Breast cancer (age of onset: 61) in her cousin; Hypertension in her father; Ovarian cancer in her maternal aunt; Uterine  cancer in her maternal aunt; Uterine cancer (age of onset: 57) in her cousin. Social History:   reports that she has never smoked. She has never used smokeless tobacco. She reports that she does not currently use alcohol after a past usage of about 7.0 standard drinks of alcohol per week. She reports that she does not use drugs.  Prior to Admission medications   Medication Sig Start Date End Date Taking? Authorizing Provider  albuterol  (VENTOLIN  HFA) 108 (90 Base) MCG/ACT inhaler Inhale 2 puffs by mouth into the lungs every 6 hours as needed for wheezing or shortness of breath. 03/18/23  Yes [name], NP  anastrozole  (ARIMIDEX ) 1 MG  tablet Take 1 tablet (1 mg total) by mouth daily. 04/18/23  Yes Causey, Morna Pickle, NP  calcitRIOL  (ROCALTROL ) 0.5 MCG capsule Take 1 capsule (0.5 mcg total) by mouth daily. 12/15/23  Yes [name], [name], MD  Calcium  Carb-Cholecalciferol  600-10 MG-MCG TABS Take 1 tablet by mouth 3 (three) times daily. 12/14/23 01/13/24 Yes [name], [name], MD  gabapentin  (NEURONTIN ) 300 MG capsule Take 1 capsule (300 mg total) by mouth 3 (three) times daily. 05/26/23  Yes [name], [name], MD  montelukast  (SINGULAIR ) 10 MG tablet Take 1 tablet (10 mg total) by mouth at bedtime. 05/01/23  Yes [name], NP  oxyCODONE -acetaminophen  (PERCOCET) 7.5-325 MG tablet Take 1-2 tablets by mouth every 6 (six) hours as needed for severe pain (pain score 7-10). 12/21/23  Yes [name], [name], NP  pantoprazole  (PROTONIX ) 40 MG tablet Take 1 tablet (40 mg total) by mouth 2 (two) times daily. 12/14/23  Yes [name], [name], MD  polyethylene glycol powder (GLYCOLAX /MIRALAX ) 17 GM/SCOOP powder Mix 17 g in 4 oz of liquid and take by mouth 2 (two) times daily as needed. Patient taking differently: Take 17 g by mouth 2 (two) times daily as needed (for constipation). 11/30/23  Yes [name], NP  prochlorperazine  (COMPAZINE ) 10 MG tablet Take 1 tablet (10 mg total) by mouth every 6 (six) hours as needed for nausea or vomiting. 12/07/23  Yes [name], Ripudeep K, MD  scopolamine  (TRANSDERM-SCOP) 1 MG/3DAYS Place 1 patch (1.5 mg total) onto the skin every 3 (three) days as needed (Dizziness, nausea or vomiting). 12/07/23  Yes [name], Ripudeep K, MD  sennosides-docusate sodium  (SENOKOT-S) 8.6-50 MG tablet Take 1 tablet by mouth daily. 11/30/23  Yes [name], NP  abemaciclib  (VERZENIO ) 50 MG tablet Take 50 mg by mouth 2 (two) times daily. Patient not taking: Reported on 12/22/2023    [provider]  propranolol  (INDERAL ) 10 MG tablet Take 1 tablet (10 mg total) by mouth 2 (two) times daily. Please call  [phone] to schedule an appointment for future refills. Thank you. 3rd attempt. Patient not taking: Reported on 12/13/2023 06/01/23   [name], [name], DO  senna (SENOKOT) 8.6 MG TABS tablet Take 2 tablets (17.2 mg total) by mouth at bedtime. Patient not taking: Reported on 12/22/2023 06/30/22   Davia [name], MD    Current Facility-Administered Medications  Medication Dose Route Frequency Provider Last Rate Last Admin  . 0.9 %  sodium chloride  infusion   Intravenous Continuous Cindy Garnette POUR, MD 75 mL/hr at 01/15/24 2252 New Bag at 01/15/24 2252  . acetaminophen  (TYLENOL ) 160 MG/5ML solution 975 mg  975 mg Per Tube Q6H Green, Terri L, RPH   975 mg at 01/16/24 0603  . albuterol  (PROVENTIL ) (2.5 MG/3ML) 0.083% nebulizer solution 2.5 mg  2.5 mg Nebulization Q6H PRN Augustus Norris  S, PA-C      . Chlorhexidine  Gluconate Cloth 2 % PADS 6 each  6 each Topical Daily Simaan, Elizabeth S, PA-C   6 each at 01/15/24 1130  . dexamethasone  (DECADRON ) injection 4 mg  4 mg Intravenous Q12H Cooper, Josseline P, PA-C   4 mg at 01/15/24 2123  . diazepam  (VALIUM ) injection 5 mg  5 mg Intravenous Q6H PRN Augustus Almarie RAMAN, PA-C   5 mg at 01/16/24 0031  . erythromycin  (EES) 400 MG/5ML suspension 400 mg  400 mg Per Tube Q12H Cindy Garnette POUR, MD   400 mg at 01/16/24 0500  . feeding supplement (OSMOLITE 1.5 CAL) liquid 1,000 mL  1,000 mL Per J Tube Q24H Augustus Almarie RAMAN, PA-C      . feeding supplement (PROSource TF20) liquid 60 mL  60 mL Per Tube Daily Augustus Almarie RAMAN, PA-C   60 mL at 01/14/24 1100  . fentaNYL  (SUBLIMAZE ) injection 25 mcg  25 mcg Intravenous Q3H PRN Cooper, Josseline P, PA-C   25 mcg at 01/16/24 0302  . free water  100 mL  100 mL Per Tube QID Augustus Almarie RAMAN, PA-C   100 mL at 01/14/24 9166  . lip balm (CARMEX) ointment   Topical PRN Simaan, Elizabeth S, PA-C      . methocarbamol  (ROBAXIN ) injection 500 mg  500 mg Intravenous Q8H Augustus Almarie RAMAN, PA-C   500 mg at 01/16/24 9396  .  ondansetron  (ZOFRAN ) 8 mg/NS 50 ml IVPB  8 mg Intravenous Q6H PRN McIlquham, Julia B, NP   Stopped at 01/13/24 2026   Or  . ondansetron  (ZOFRAN ) 4 MG/5ML solution 8 mg  8 mg Oral Q6H PRN McIlquham, Julia B, NP      . oxyCODONE  (ROXICODONE ) 5 MG/5ML solution 10 mg  10 mg Per J Tube Q4H PRN Sebastian Toribio GAILS, MD   10 mg at 01/14/24 2358  . pantoprazole  (PROTONIX ) injection 40 mg  40 mg Intravenous Q12H Augustus Almarie RAMAN, PA-C   40 mg at 01/15/24 2122  . phenol (CHLORASEPTIC) mouth spray 1 spray  1 spray Mouth/Throat PRN Simaan, Elizabeth S, PA-C      . prochlorperazine  (COMPAZINE ) injection 10 mg  10 mg Intravenous Q6H PRN McIlquham, Julia B, NP   10 mg at 01/16/24 0615   Or  . prochlorperazine  (COMPAZINE ) tablet 10 mg  10 mg Oral Q6H PRN McIlquham, Julia B, NP      . sodium chloride  flush (NS) 0.9 % injection 10-40 mL  10-40 mL Intracatheter Q12H Augustus Almarie RAMAN, PA-C   20 mL at 01/15/24 2123  . sodium chloride  flush (NS) 0.9 % injection 10-40 mL  10-40 mL Intracatheter PRN Augustus Almarie RAMAN, PA-C      . TPN ADULT (ION)   Intravenous Continuous TPN Shade, Christine E, RPH 45 mL/hr at 01/15/24 1725 New Bag at 01/15/24 1725  . trimethobenzamide  (TIGAN ) injection 200 mg  200 mg Intramuscular Q6H PRN Cindy Garnette POUR, MD   200 mg at 01/13/24 2140  . Vitamin D  (Ergocalciferol ) (DRISDOL ) 1.25 MG (50000 UNIT) capsule 50,000 Units  50,000 Units Oral Q7 days Augustus Almarie RAMAN, PA-C        Allergies as of 12/22/2023  . (No Known Allergies)    Review of Systems:    Constitutional: No weight loss, fever, chills, weakness or fatigue HEENT: Eyes: No change in vision               Ears, Nose, Throat:  No change in hearing  or congestion Skin: No rash or itching Cardiovascular: No chest pain, chest pressure or palpitations   Respiratory: No SOB or cough Gastrointestinal: See HPI and otherwise negative Genitourinary: No dysuria or change in urinary frequency Neurological: No headache, dizziness  or syncope Musculoskeletal: No new muscle or joint pain Hematologic: No bleeding or bruising Psychiatric: No history of depression or anxiety     Physical Exam:  Vital signs in last 24 hours: Temp:  [98.5 F (36.9 C)-99.1 F (37.3 C)] 98.6 F (37 C) (07/28 0432) Pulse Rate:  [91-94] 91 (07/28 0432) Resp:  [18] 18 (07/27 1343) BP: (118-135)/(82-85) 118/83 (07/28 0432) SpO2:  [96 %-100 %] 98 % (07/28 0432) Weight:  [89.2 kg] 89.2 kg (07/28 0635) Last BM Date : 01/15/24 Last BM recorded by nurses in past 5 days Stool Type: Type 6 (Mushy consistency with ragged edges) (01/16/2024 12:00 AM)  General:   Awake lying in bed no apparent distress dried lips Head:  Normocephalic and atraumatic. Eyes: sclerae anicteric,conjunctive pink  Heart:  regular rate and rhythm, no murmurs or gallops Pulm: Clear anteriorly; no wheezing Abdomen:  Soft, slightly distended, Obese AB, Sluggish bowel sounds. moderate tenderness around J-tube. Without guarding and Without rebound, bile leak coming possibly from J-tube site. Neurologic:  Alert and  oriented x4;  No focal deficits.  Skin:   Dry and intact without significant lesions or rashes. Psychiatric:  Cooperative. Normal mood and affect.  LAB RESULTS: Recent Labs    01/15/24 0306 01/16/24 0509  WBC 4.5 6.6  HGB 7.1* 7.2*  HCT 22.6* 23.2*  PLT 176 182   BMET Recent Labs    01/14/24 0118 01/15/24 0306 01/16/24 0509  NA 138 137 136  K 4.1 4.1 3.8  CL 111 109 107  CO2 21* 21* 22  GLUCOSE 131* 140* 120*  BUN 15 15 11   CREATININE 0.50 0.43* 0.41*  CALCIUM  7.0* 6.9* 7.2*   LFT Recent Labs    01/16/24 0509  PROT 5.8*  ALBUMIN  2.3*  AST 26  ALT 23  ALKPHOS 703*  BILITOT 0.9   PT/INR No results for input(s): LABPROT, INR in the last 72 hours.  STUDIES: CT ABDOMEN PELVIS W CONTRAST Result Date: 01/15/2024 CLINICAL DATA:  Abdominal pain with nausea.  J tube feeding. EXAM: CT ABDOMEN AND PELVIS WITH CONTRAST TECHNIQUE:  Multidetector CT imaging of the abdomen and pelvis was performed using the standard protocol following bolus administration of intravenous contrast. RADIATION DOSE REDUCTION: This exam was performed according to the departmental dose-optimization program which includes automated exposure control, adjustment of the mA and/or kV according to patient size and/or use of iterative reconstruction technique. CONTRAST:  OMNIPAQUE  IOHEXOL  300 MG/ML  SOLN COMPARISON:  CT abdomen and pelvis 12/29/2023. FINDINGS: Lower chest: There is atelectasis in the lung bases. There are small bilateral pleural effusions, right greater than left. Hepatobiliary: No focal liver abnormality is seen. No gallstones, gallbladder wall thickening, or biliary dilatation. Pancreas: Fullness of the humeral head appears unchanged from prior. Pancreas otherwise appears within normal limits. Spleen: Normal in size without focal abnormality. Adrenals/Urinary Tract: Subcentimeter cysts are identified in the left kidney. There is mild right-sided hydronephrosis without obstructing calculus identified. There is no left-sided hydronephrosis. The adrenal glands and bladder are within normal limits. The bladder is distended. Stomach/Bowel: The appendix appears normal. There is no free intraperitoneal air. There is diffuse colonic diverticulosis. Percutaneous jejunostomy tube in place. There is some mildly dilated jejunal loop centrally without transition point. Contrast is seen beyond  dilated small bowel. Percutaneous gastrostomy tube in place. Stomach is nondilated. Vascular/Lymphatic: No significant vascular findings are present. No enlarged abdominal or pelvic lymph nodes. Reproductive: Status post hysterectomy. No adnexal masses. Other: There is a small amount of free fluid in the abdomen and pelvis. There is some air within the anterior right abdominal wall. There is diffuse body wall edema. Musculoskeletal: Sclerotic osseous metastatic disease and  chronic compression fracture of L3 appear unchanged. IMPRESSION: 1. Percutaneous jejunostomy tube in place. There is some mildly dilated jejunal loop centrally without transition point. Findings may represent ileus or partial small bowel obstruction. 2. Small amount of free fluid in the abdomen and pelvis. 3. Small bilateral pleural effusions, right greater than left. 4. Diffuse body wall edema. 5. Mild right-sided hydronephrosis without obstructing calculus identified. 6. Distended bladder. 7. Colonic diverticulosis. 8. Sclerotic osseous metastatic disease. Electronically Signed   By: Greig Pique M.D.   On: 01/15/2024 19:45   EGD/EUS 12/28/2023:   EGD impression:  - No gross lesions in the proximal esophagus.  - LA Grade C esophagitis with no bleeding found distally (appears improved from prior).  - Z-line irregular, 39 cm from the incisors.  - 3 cm hiatal hernia.  - Retained gastric fluid. 900 cc removed on today's procedure.  - J-shaped and dilation deformity in the entire stomach. - Erythematous mucosa in the stomach. Previously biopsied and negative for HP.  - No gross lesions in the duodenal bulb.  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 Biopsied.    EUS impression:  - Pancreatic parenchymal abnormalities consisting of diffuse echogenicity and lobularity were noted in the pancreatic head, genu of the pancreas, pancreatic body and pancreatic tail. No overt mass/lesion was noted in this particular regions visualized. Uncinate process not able to be visualized.  - The pancreatic duct within the head was prominent and was dilated in the genu of the pancreas, body of the pancreas and tail of the pancreas.  - There was a normal-appearing common bile duct and prominent/slightly dilated common hepatic duct.  - No malignant-appearing lymph nodes were visualized in the celiac region (level 20), peripancreatic region and  porta hepatis region.   EGD 12/27/2023: - No gross lesions in the proximal esophagus.  - LA Grade C esophagitis with no bleeding found distally.  - 2 cm hiatal hernia.  - Retained gastric fluid.  - Erythematous mucosa in the stomach. No change from yesterday.  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 Dilated under fluoroscopy up to 12 mm. It is felt that this duodenal stenosis is greater than 5 cm in length. Unable to traverse even after dilation.    Impression    Nausea, vomiting upper abdominal discomfort with acquired duodenal stricture,  masslike appearance on pancreas seen on abdominal MRI 7/4,  EUS without mass 7/9, biopsies nonspecific EGD 7/9 unable to transverse duodenal stenosis  Status post laparoscopic creation of gastrojejunostomy 7/14 with G-tube and J-tube downstream insertion 7/22 Recent esophagitis status post fluconazole  treatment  duodenal stricture status post dilation 12/09/2023 EGD 12/28/2023 LA grade C esophagitis, 3 cm HH retained gastric fluid, J-shaped and dilation for me entire stomach acquired duodenal stenosis duodenal angle US  12/28/2023 pancreatic parenchymal abnormalities consisting diffuse echogenicity and lobularity entire pancreatic head body and tail no overt mass or lesion prominent pancreatic duct within the head dilated, normal-appearing CBD and prominent/slightly dilated common hepatic duct no malignant appearing lymph nodes On TPN, TF started 12/2218  mL/h stopped due to overnight emesis patient refused 7/25 Patient's been on taken every 6 hours as needed 7/23, Compazine , Protonix , Zofran , Valium , erythromycin  since 7/25 ordered milligrams every 12 hours, Decadron  4 mg every 12 hours starting on 7/26 Elevated CA 19-9 469 - Will recheck CA 19-9 - Patient going for celiac block today which may help pain and some of the nausea vomiting - Consider restarting tube feeds 10 m/hr - Consider methylnaltrexone  however patient currently having diarrhea -  Uncertain we will still be able to transverse duodenal stenosis can set up for EGD tomorrow with Dr. Wilhelmenia for further evaluation. I discussed risks of EGD with patient today, including risk of sedation, bleeding or perforation.  Patient provides understanding and gave verbal consent to proceed. - Further recommendations per Dr. 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   - Attempted trickle feed through J-tube, however pt did not tolerate secondary to nasuea  Principal Problem:   AKI (acute kidney injury) (HCC) Active Problems:   Hypokalemia   HLD (hyperlipidemia)   Breast cancer metastasized to bone, right (HCC)   Constipation   Acute kidney injury superimposed on chronic kidney disease (HCC)   Dehydration   Prolonged QT interval   Hypocalcemia   Acute esophagitis   Candida esophagitis (HCC)   Transaminitis   Pancreatic mass   Right sided weakness   Hypotension   Abnormal CT of the abdomen   Elevated liver enzymes   Acute kidney injury (HCC)   Abnormal MRI of abdomen   Pancreatic lesion   Unintentional weight loss   Nausea and vomiting   Duodenal  obstruction   Hypophosphatemia   Hypomagnesemia    LOS: 24 days    Thank you for your kind consultation, we will continue to follow.   Alan JONELLE Coombs  01/16/2024, 8:17 AM

## 2024-01-16 NOTE — Plan of Care (Signed)
   Daily Progress Note   Patient Name: Chelsea Hansen       Date: 01/16/2024 DOB: 18-Mar-1971  Age: 53 y.o. MRN#: [id number] Attending Physician: Cindy Garnette POUR, MD Primary Care Physician: [name], NP Admit Date: 12/22/2023 Length of Stay: 24 days  Medical records reviewed including progress notes, labs, imaging, MAR. Patient required Fentanyl  IV PRN x3 in the past 24 hours. I was unable to assess patient today as she was IR during the time I was available. GI and surgery on board.  Discussed plan with IR PA-C today. Appreciate IR assistance with celiac plexus block for pain management. Recommend continuing BID steroid and PRN opioids while awaiting full effect of nerve block. She does not feel comfortable taking Zyprexa  for nausea.  Goals of care are clear at this time for full scope treatment and return home when stable. Patient desires to continue with outpatient palliative visits at [facility].   PMT will follow peripherally at this time. Please reach out if acute PMT needs arise in the future. Thank you.   Larenzo Caples, [name] Palliative Care Provider PMT # [phone]  No Charge

## 2024-01-16 NOTE — Progress Notes (Signed)
 6 Days Post-Op   Subjective/Chief Complaint: No nausea at present, denies vomiting. Reports a loose stool this AM Per RN there was drainage around G tube and J tube sites today.  Objective: Vital signs in last 24 hours: Temp:  [98.5 F (36.9 C)-99.1 F (37.3 C)] 98.6 F (37 C) (07/28 0432) Pulse Rate:  [91-94] 91 (07/28 0432) Resp:  [18] 18 (07/27 1343) BP: (118-135)/(82-85) 118/83 (07/28 0432) SpO2:  [96 %-100 %] 98 % (07/28 0432) Weight:  [89.2 kg] 89.2 kg (07/28 0635) Last BM Date : 01/15/24  Intake/Output from previous day: 07/27 0701 - 07/28 0700 In: 2098.3 [I.V.:1348.3] Out: 3300 [Urine:2200; Emesis/NG output:200; Drains:900] Intake/Output this shift: No intake/output data recorded.  Abdomen soft and moderately tender, incisions clean dry intact with glue G tube to gravity with bilious drainage - scant purulent around G tube without cellulitis J tube clamped - scant bilious drainage around tube. I removed 2.5 mL saline from J tube balloon. J tube flushed with 10 mL NS without meeting resistance.    Lab Results:  Recent Labs    01/15/24 0306 01/16/24 0509  WBC 4.5 6.6  HGB 7.1* 7.2*  HCT 22.6* 23.2*  PLT 176 182   BMET Recent Labs    01/15/24 0306 01/16/24 0509  NA 137 136  K 4.1 3.8  CL 109 107  CO2 21* 22  GLUCOSE 140* 120*  BUN 15 11  CREATININE 0.43* 0.41*  CALCIUM  6.9* 7.2*   PT/INR No results for input(s): LABPROT, INR in the last 72 hours. ABG No results for input(s): PHART, HCO3 in the last 72 hours.  Invalid input(s): PCO2, PO2  Studies/Results: CT ABDOMEN PELVIS W CONTRAST Result Date: 01/15/2024 CLINICAL DATA:  Abdominal pain with nausea.  J tube feeding. EXAM: CT ABDOMEN AND PELVIS WITH CONTRAST TECHNIQUE: Multidetector CT imaging of the abdomen and pelvis was performed using the standard protocol following bolus administration of intravenous contrast. RADIATION DOSE REDUCTION: This exam was performed according to the  departmental dose-optimization program which includes automated exposure control, adjustment of the mA and/or kV according to patient size and/or use of iterative reconstruction technique. CONTRAST:  OMNIPAQUE  IOHEXOL  300 MG/ML  SOLN COMPARISON:  CT abdomen and pelvis 12/29/2023. FINDINGS: Lower chest: There is atelectasis in the lung bases. There are small bilateral pleural effusions, right greater than left. Hepatobiliary: No focal liver abnormality is seen. No gallstones, gallbladder wall thickening, or biliary dilatation. Pancreas: Fullness of the humeral head appears unchanged from prior. Pancreas otherwise appears within normal limits. Spleen: Normal in size without focal abnormality. Adrenals/Urinary Tract: Subcentimeter cysts are identified in the left kidney. There is mild right-sided hydronephrosis without obstructing calculus identified. There is no left-sided hydronephrosis. The adrenal glands and bladder are within normal limits. The bladder is distended. Stomach/Bowel: The appendix appears normal. There is no free intraperitoneal air. There is diffuse colonic diverticulosis. Percutaneous jejunostomy tube in place. There is some mildly dilated jejunal loop centrally without transition point. Contrast is seen beyond dilated small bowel. Percutaneous gastrostomy tube in place. Stomach is nondilated. Vascular/Lymphatic: No significant vascular findings are present. No enlarged abdominal or pelvic lymph nodes. Reproductive: Status post hysterectomy. No adnexal masses. Other: There is a small amount of free fluid in the abdomen and pelvis. There is some air within the anterior right abdominal wall. There is diffuse body wall edema. Musculoskeletal: Sclerotic osseous metastatic disease and chronic compression fracture of L3 appear unchanged. IMPRESSION: 1. Percutaneous jejunostomy tube in place. There is some mildly dilated  jejunal loop centrally without transition point. Findings may represent ileus or  partial small bowel obstruction. 2. Small amount of free fluid in the abdomen and pelvis. 3. Small bilateral pleural effusions, right greater than left. 4. Diffuse body wall edema. 5. Mild right-sided hydronephrosis without obstructing calculus identified. 6. Distended bladder. 7. Colonic diverticulosis. 8. Sclerotic osseous metastatic disease. Electronically Signed   By: Greig Pique M.D.   On: 01/15/2024 19:45       Anti-infectives: Anti-infectives (From admission, onward)    Start     Dose/Rate Route Frequency Ordered Stop   01/13/24 1545  erythromycin  (EES) 400 MG/5ML suspension 400 mg        400 mg Per Tube Every 12 hours 01/13/24 1455     01/10/24 0600  ceFAZolin  (ANCEF ) IVPB 2g/100 mL premix        2 g 200 mL/hr over 30 Minutes Intravenous On call to O.R. 01/09/24 1324 01/10/24 0948   01/02/24 1245  ceFAZolin  (ANCEF ) 2-4 GM/100ML-% IVPB       Note to Pharmacy: Kathern Quant: cabinet override      01/02/24 1245 01/03/24 0059   12/22/23 2230  fluconazole  (DIFLUCAN ) IVPB 200 mg  Status:  Discontinued        200 mg 100 mL/hr over 60 Minutes Intravenous Every 24 hours 12/22/23 2132 01/03/24 1126       Assessment/Plan: Pancreatic mass causing duodenal obstruction/stricture  POD 14- S/P LAPAROSCOPIC CREATION GASTRJEJONOSTOMY, 7/14, Dr. Lyndel  POD 6 - S/P UPPER ENDORSCOPY, LAPAROSCOPY G TUBE INSERTION, LAPAROSCOPIC J TUBE INSTERTION 7/22 Dr. Stevie - continue G tube to gravity - resume J tube feeds today at a trickle, do not advance; no crushed meds per J tube, only liquids. -CT with PO contrast 7/27 shows no complication with G/J tubes, loop of dilated jejunum without transition point. Contrast seen distal to this bowel loop. Clinically she is not obstructed, having some flatus and loose stools. - increase TPN to full stopport today, re-attempt TF as above.  Pancreatic mass causing duodenal obstruction  -CA 19-9 is 465 - EUS 7/7 w/ bx that were nonspecific.    Metastatic breast cancer w/ mets to bone    FEN - NPO for IR procedure -- plexxus block for pain VTE - ok for chemical prophylaxis, SCDs HTN HLD protein calorie nutrition - PICC, TPN   I reviewed hospitalist notes, last 24 h vitals and pain scores, last 48 h intake and output, last 24 h labs and trends, and last 24 h imaging results.   Almarie GORMAN Pringle, PA-C  01/16/2024

## 2024-01-16 NOTE — Plan of Care (Signed)

## 2024-01-16 NOTE — Progress Notes (Signed)
 Nutrition Follow-up  DOCUMENTATION CODES:   Not applicable  INTERVENTION:  - Per Surgery, restart trickle tube feeds via J-tube today: Osmolite 1.5 @ 34mL/hr  - Plan to increase TPN back to goal to meet 100% of estimated needs.  - TPN management per pharmacy.   - Diet per MD/Surgery. Currently NPO.  - If able to advance tube feeds past trickle, would recommend: Osmolite 1.5 at 55 ml/h (1320 ml per day) *Advancements per Surgery Prosource TF20 60 ml daily Provides 2060 kcal, 103 gm protein, 1006 ml free water  daily  NUTRITION DIAGNOSIS:   Unintentional weight loss related to chronic illness (Pancreatic mass with duodenal stricture/obstruction in the setting of metastatic breast cancer) as evidenced by percent weight loss (50# or 25% in <3 months). *ongoing  GOAL:   Patient will meet greater than or equal to 90% of their needs *progressing, increasing TPN back to meet 100% needs  MONITOR:   PO intake, Supplement acceptance, Diet advancement, Labs, Weight trends  REASON FOR ASSESSMENT:   Consult Enteral/tube feeding initiation and management  ASSESSMENT:   53 y.o. 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  7/3: admitted, clear liquids 7/5: FLD 7/6: CLD 7/7: NPO, s/p EUS 7/8: CLD, s/p EGD 7/9: TPN initiated 7/10: NPO 7/11: NGT placed for LIS 7/12: TPN advanced to goal 7/14: s/p gastrojejonostomy 7/15: FLD 7/22: s/p G-tube and J-tube placement; CLD; TF's initiated @ 20 7/23 TF stopped overnight for emesis 7/24 TF stopped overnight for  emesis 7/25 TF stopped overnight for emesis; Surgery put in to start TF at 15mL/hr but patient refused 7/26 TF to be held indefinitely 7/28 TPN to increase back to goal to meet nutritional needs  Patient's tube feeds have remained on hold since 7/25 as patient continues to have intolerance with ongoing N/V.   Per discussion with Surgery today, plan to restart trickle tube feeds (107mL/hr) today via J-tube. No plan to advance at this time.  TPN has remained at 73mL/hr but plan to increase back to goal of 11mL/hr today. This will provide 103g protein and 2237 kcals, meeting 100% of estimated needs.   Admission weight: 169 lbs Current weight: 196 lbs I&O's: +13.4L since 7/14   Medications: 50,000 units Vitamin D  weekly   Labs reviewed: - Triglycerides 99 (as of 7/28)  Diet Order:   Diet Order             Diet NPO time specified Except for: Sips with Meds  Diet effective now                   EDUCATION NEEDS:  Education needs have been addressed  Skin:  Skin Assessment: Reviewed RN Assessment  Last BM:  7/28 - type 7  Height:  Ht Readings from Last 1 Encounters:  01/10/24 5' 11 (1.803 m)   Weight:  Wt Readings from Last 1 Encounters:  01/16/24 89.2 kg   Ideal Body Weight:  70 kg  BMI:  Body mass index is 27.43 kg/m.  Estimated Nutritional Needs:  Kcal:  2000-2300kcal/day Protein:  100-115g/day Fluid:  >/= 2L    Trude Ned RD, LDN Contact via Secure Chat.

## 2024-01-16 NOTE — Procedures (Signed)
  Procedure:  CT guided celiac plexus block 10ml bupivicaine 0.5% Preprocedure diagnosis: Abdominal pain. The primary encounter diagnosis was AKI (acute kidney injury) (HCC). A diagnosis of Nausea and vomiting, unspecified vomiting type was also pertinent to this visit. Postprocedure diagnosis: same EBL:    minimal Complications:   none immediate  See full dictation in [employer].  CHARM Toribio Faes MD Main # [phone] Pager  [phone] Mobile [phone]

## 2024-01-16 NOTE — Progress Notes (Signed)
 Progress Note   Patient: Chelsea Hansen FMW:[id number] DOB: Nov 19, 1970 DOA: 12/22/2023     24 DOS: the patient was seen and examined on 01/16/2024   Brief hospital course: 53 y.o. 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   Imaging showed a duodenal obstruction and masslike area of the pancreas.  GI and General Surgery consulted.     EUS attempted on 7/7, but unsuccessful secondary to duodenal stenosis.     Repeat EGD/EUS on 7/9 again unsuccessful in biopsy.  Gen surg consulted and NG tube was placed, patient started on TPN, patient subsequently underwent laparoscopic creation of gastrojejunostomy for duodenal stricture.  Patient started on clear liquids and diet advanced to full liquid diet. -Patient with ongoing nausea vomiting and followed by general surgery. -Patient to undergo gastrojejunostomy tube tube insertion for drainage of the stomach and J-tube downstream for tube feeds today, 01/10/2024.  Assessment and Plan: #1 pancreatic mass with duodenal stricture/SBO in the setting of pancreatic mass/??  Ileus -Patient noted to have a new mass noted on imaging of the pancreas noted on CT abdomen and pelvis. - Patient underwent MRCP which was concerning for expansive, masslike appearance of the pancreatic head and uncinate as noted on prior CT measuring 2.5 x 2.3 cm which was noted to be new and interval to prior restaging PET/CT dated 06/10/2023 and highly concerning for pancreatic adenocarcinoma. -CA 19-9 noted at 465. - Patient seen in consultation by GI however unfortunately EUS which was attempted on 7 7 was unsuccessful secondary to duodenal stenosis with repeat EGD/EUS done on 7 /9 noted to be unsuccessful and unable to get a biopsy. - General Surgery was consulted and patient underwent a gastrojejunal bypass surgery on 7/14 with no concerning nodules or evidence of metastatic  disease on diagnostic laparoscopy. - Patient noted to have tolerated full liquid diet on 01/03/2024 however around 8 PM noted to had episode of emesis. -Patient noted with emesis the morning of 01/05/2024 and has been having emesis at least twice daily with last episode of emesis early on this morning, 01/09/2024.    -Per general surgery upper GI on 7/18 without complication.   - Patient still with ongoing nausea and emesis with gastrojejunostomy tube for drainage of stomach and downstream tube for feeds which was placed 7/22 -consideration for re-attempt at EGD EUS for further evaluation of pancreatic mass in 8 weeks - Discontinued fentanyl  PCA pump, Dilaudid , and oxycodone  given intolerance due to BP and lethargy -Palliative Care consulted to assist with symptom management -Currently maintained on 650mg  tylenol . Follow LFT trends -on trial of Tigan  for nausea -Continues with nausea and abd pain. Trickle feed on hold per surgery, -f/u CT abd ordered by General Surgery, notable for findings of ileus -GI reconsulted, would appreciate recs -Pt for celiac plexus nerve block    2.  Hypertension - Continue to hold home antihypertensive medications.  - received 2 units prbcs on 7/22 for hgb 6.1, remained stable afterwards -bp stable   3.  Moderate protein calorie malnutrition - On TPN.   - Attempted trickle feed through J-tube, however pt did not tolerate secondary to nasuea   4.  Candida esophagitis - Status post full course fluconazole .    5.  Chronic asthma -Stable.   6.  AKI on CKD stage 3a - Patient noted to have a creatinine of 3.2 on admission which improved with hydration and resolved, creatinine stable  7.  Hypophosphatemia/hypomagnesemia/hypomagnesemia -Being repleted per pharmacy as patient on TPN.   8.  Chronic anemia -Patient with no overt bleeding. - Hemoglobin at 6.1 from 8.5 from 7.4 after transfusion 1 unit PRBCs on 01/06/2024. -Hgb down to 6.1 on 7/22. Given 2 more  units PRBC's   9.  Dizziness/lightheadedness/soft blood pressure/hypotension -Patient noted to have an episode of nausea and abdominal pain after drinking soup in the afternoon on 01/07/2024.  - Patient given IV Dilaudid , IV Compazine  and noted to have transient unresponsiveness of less than 1 minute and subsequently improved.  Repeat vitals at that time noted with initial blood pressure of 110/66 with blood pressure dropping down into the high 80s to low 90s - IV Dilaudid  discontinued per above.   - seems stable at this time      Subjective: Feeling nauseated. Pt seen prior to celiac plexus block  Physical Exam: Vitals:   01/16/24 1520 01/16/24 1525 01/16/24 1530 01/16/24 1536  BP: (!) 141/89 127/86 133/84 133/82  Pulse: 75 79 76 80  Resp: 20 (!) 23 20 17   Temp:      TempSrc:      SpO2: 99% 98% 98% 98%  Weight:      Height:       General exam: Conversant, in no acute distress Respiratory system: normal chest rise, clear, no audible wheezing Cardiovascular system: regular rhythm, s1-s2 Gastrointestinal system: mildly distended pos BS Central nervous system: No seizures, no tremors Extremities: No cyanosis, no joint deformities Skin: No rashes, no pallor Psychiatry: Affect normal // no auditory hallucinations    Data Reviewed:  Labs reviewed: Na 136, K 3.8, Cr 0.41, WBC 6.6, Hgb 7.2, Plts 182  Family Communication: Pt in room, family not at bedside  Disposition: Status is: Inpatient Remains inpatient appropriate because: severity of illness  Planned Discharge Destination: Home    Author: Garnette Pelt, MD 01/16/2024 4:17 PM  For on call review [email or web address].

## 2024-01-16 NOTE — Consult Note (Signed)
 Chief Complaint: Pancreatic mass with duodenal stricture/obstruction - image guided celiac plexus block   Referring Provider(s): Wonda Sabina   Supervising Physician: Johann Sieving  Patient Status: [facility] - In-pt  History of Present Illness: Chelsea Hansen is a 53 y.o. 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  Patient presented to the emergency department 12/10/2023 via EMS after stroke symptoms.  Upon emergency department workup, stroke was ruled out but she was found to have hypocalcemia and acute kidney injury.  She was admitted for further treatment of her AKI and electrolyte replacement.  Upon further hospital workup, patient was found to have a pancreatic mass causing duodenal obstruction\stricture.  She is postop day 11 from laparoscopic gastrojejunostomy 01/02/2024 and postop day 3 laparoscopic G-tube placement and J-tube insertion 01/10/2024.  Interventional radiology was consulted for possible mass biopsy and celiac plexus block.  Imaging was reviewed by Dr. Johann; there is no target for biopsy but celiac plexus block may help with pain.  Patient was approved for celiac plexus block and scheduled for 01/16/2024 2:30 PM.   Patient is Full Code  Past Medical History:  Diagnosis Date   Allergy    seasonal allergies   Anxiety    on meds   Asthma    uses inhaler   Breast cancer (HCC) 2012   RIGHT lumpectomy   Cancer (HCC) 2022   RIGHT breast lump-dx 2022   Depression    on meds   DVT (deep venous thrombosis) (HCC) 2010   after hysterectomy   Family history of uterine cancer    GERD (gastroesophageal reflux disease)    with certain foods/OTC PRN meds   Headache(784.0)    History of radiation therapy    Bilateral breast- 09/03/21-11/02/21- Dr. Lynwood Nasuti   Hyperlipidemia    on meds   Hypertension    on meds   SVT (supraventricular  tachycardia) (HCC)     Past Surgical History:  Procedure Laterality Date   ABDOMINAL HYSTERECTOMY     AXILLARY SENTINEL NODE BIOPSY Left 07/09/2021   Procedure: LEFT AXILLARY SENTINEL NODE BIOPSY;  Surgeon: [name], MD;  Location: [facility];  Service: General;  Laterality: Left;   BALLOON DILATION N/A 12/13/2023   Procedure: MERRILL HODGKIN;  Surgeon: Federico Rosario BROCKS, MD;  Location: [facility] ENDOSCOPY;  Service: Gastroenterology;  Laterality: N/A;   BIOPSY  06/29/2022   Procedure: BIOPSY;  Surgeon: Legrand Victory LITTIE DOUGLAS, MD;  Location: [facility] ENDOSCOPY;  Service: Gastroenterology;;   BIOPSY OF SKIN SUBCUTANEOUS TISSUE AND/OR MUCOUS MEMBRANE  12/13/2023   Procedure: BIOPSY, SKIN, SUBCUTANEOUS TISSUE, OR MUCOUS MEMBRANE;  Surgeon: Federico Rosario BROCKS, MD;  Location: [facility] ENDOSCOPY;  Service: Gastroenterology;;   BREAST EXCISIONAL BIOPSY Right 09/16/2009   BREAST LUMPECTOMY WITH RADIOACTIVE SEED LOCALIZATION Bilateral 07/09/2021   Procedure: BILATERAL BREAST LUMPECTOMY WITH RADIOACTIVE SEED LOCALIZATION;  Surgeon: [name], MD;  Location:  SURGERY CENTER;  Service: General;  Laterality: Bilateral;   BREAST SURGERY     lumpectomy   ESOPHAGOGASTRODUODENOSCOPY N/A 12/13/2023   Procedure: EGD (ESOPHAGOGASTRODUODENOSCOPY);  Surgeon: Federico Rosario BROCKS, MD;  Location: [facility] ENDOSCOPY;  Service: Gastroenterology;  Laterality: N/A;   ESOPHAGOGASTRODUODENOSCOPY N/A 12/26/2023   Procedure: EGD (ESOPHAGOGASTRODUODENOSCOPY);  Surgeon: Wilhelmenia Aloha Raddle., MD;  Location: [facility];  Service: Gastroenterology;  Laterality: N/A;   ESOPHAGOGASTRODUODENOSCOPY N/A 12/27/2023   Procedure: EGD (ESOPHAGOGASTRODUODENOSCOPY);  Surgeon: Wilhelmenia Aloha Raddle., MD;  Location: [facility];  Service: Gastroenterology;  Laterality: N/A;   ESOPHAGOGASTRODUODENOSCOPY (EGD) WITH PROPOFOL  N/A 06/29/2022   Procedure: ESOPHAGOGASTRODUODENOSCOPY (EGD) WITH PROPOFOL ;  Surgeon: Legrand Victory LITTIE DOUGLAS, MD;   Location: [facility];  Service: Gastroenterology;  Laterality: N/A;   EUS N/A 12/26/2023   Procedure: ULTRASOUND, UPPER GI TRACT, ENDOSCOPIC;  Surgeon: Wilhelmenia Aloha Raddle., MD;  Location: [facility];  Service: Gastroenterology;  Laterality: N/A;   EUS N/A 12/27/2023   Procedure: ULTRASOUND, UPPER GI TRACT, ENDOSCOPIC;  Surgeon: Wilhelmenia Aloha Raddle., MD;  Location: [facility];  Service: Gastroenterology;  Laterality: N/A;   LAPAROSCOPIC INSERTION GASTROSTOMY TUBE N/A 01/10/2024   Procedure: laparoscopic insertion of gastrostomy tube, laparoscopic insertion of jejunostomy tube, upper endoscopy;  Surgeon: Kinsinger, Herlene Righter, MD;  Location: [facility];  Service: General;  Laterality: N/A;  Laproscopic insertion   LAPAROSCOPIC ROUX-EN-Y GASTRIC BYPASS WITH HIATAL HERNIA REPAIR N/A 01/02/2024   Procedure: LAPAROSCOPIC CREATION GASTRJEJONOSTOMY;  Surgeon: Lyndel Deward PARAS, MD;  Location: [facility];  Service: General;  Laterality: N/A;  GASTROJEJUNOSTOMY   PORT-A-CATH REMOVAL Left 07/09/2021   Procedure: REMOVAL PORT-A-CATH;  Surgeon: [name], MD;  Location: [facility];  Service: General;  Laterality: Left;   PORTACATH PLACEMENT Left 01/26/2021   Procedure: INSERTION PORT-A-CATH;  Surgeon: [name], MD;  Location: [facility];  Service: General;  Laterality: Left;   RADIOACTIVE SEED GUIDED AXILLARY SENTINEL LYMPH NODE Right 07/09/2021   Procedure: RADIOACTIVE SEED GUIDED RIGHT AXILLARY SENTINEL LYMPH NODE DISSECTION;  Surgeon: [name], MD;  Location:  SURGERY CENTER;  Service: General;  Laterality: Right;   TUBAL LIGATION      Allergies: Patient has no known allergies.  Medications: Prior to Admission medications   Medication Sig Start Date End Date Taking? Authorizing Provider  albuterol  (VENTOLIN  HFA) 108 (90 Base) MCG/ACT inhaler Inhale 2 puffs by mouth into the lungs every 6 hours as needed for wheezing or shortness of breath. 03/18/23  Yes  [name], NP  anastrozole  (ARIMIDEX ) 1 MG tablet Take 1 tablet (1 mg total) by mouth daily. 04/18/23  Yes Causey, Morna Pickle, NP  calcitRIOL  (ROCALTROL ) 0.5 MCG capsule Take 1 capsule (0.5 mcg total) by mouth daily. 12/15/23  Yes [name], [name], MD  Calcium  Carb-Cholecalciferol  600-10 MG-MCG TABS Take 1 tablet by mouth 3 (three) times daily. 12/14/23 01/13/24 Yes [name], [name], MD  gabapentin  (NEURONTIN ) 300 MG capsule Take 1 capsule (300 mg total) by mouth 3 (three) times daily. 05/26/23  Yes [name], [name], MD  montelukast  (SINGULAIR ) 10 MG tablet Take 1 tablet (10 mg total) by mouth at bedtime. 05/01/23  Yes [name], NP  oxyCODONE -acetaminophen  (PERCOCET) 7.5-325 MG tablet Take 1-2 tablets by mouth every 6 (six) hours as needed for severe pain (pain score 7-10). 12/21/23  Yes [name], [name], NP  pantoprazole  (PROTONIX ) 40 MG tablet Take 1 tablet (40 mg total) by mouth 2 (two) times daily. 12/14/23  Yes [name], [name], MD  polyethylene glycol powder (GLYCOLAX /MIRALAX ) 17 GM/SCOOP powder Mix 17 g in 4 oz of liquid and take by mouth 2 (two) times daily as needed. Patient taking differently: Take 17 g by mouth 2 (two) times daily as needed (for constipation). 11/30/23  Yes [name], NP  prochlorperazine  (COMPAZINE ) 10 MG tablet Take 1 tablet (10 mg total) by mouth every 6 (six) hours as needed for nausea or vomiting. 12/07/23  Yes [name], Ripudeep K, MD  scopolamine  (TRANSDERM-SCOP) 1 MG/3DAYS Place 1 patch (1.5 mg total) onto the skin every 3 (  three) days as needed (Dizziness, nausea or vomiting). 12/07/23  Yes [name], Ripudeep K, MD  sennosides-docusate sodium  (SENOKOT-S) 8.6-50 MG tablet Take 1 tablet by mouth daily. 11/30/23  Yes [name], NP  abemaciclib  (VERZENIO ) 50 MG tablet Take 50 mg by mouth 2 (two) times daily. Patient not taking: Reported on 12/22/2023    [provider]  propranolol  (INDERAL ) 10 MG tablet Take 1 tablet  (10 mg total) by mouth 2 (two) times daily. Please call [phone] to schedule an appointment for future refills. Thank you. 3rd attempt. Patient not taking: Reported on 12/13/2023 06/01/23   [name], [name], DO  senna (SENOKOT) 8.6 MG TABS tablet Take 2 tablets (17.2 mg total) by mouth at bedtime. Patient not taking: Reported on 12/22/2023 06/30/22   Davia [name], MD     Family History  Problem Relation Age of Onset   Breast cancer Mother        25s   Hypertension Father    Uterine cancer Maternal Aunt    Ovarian cancer Maternal Aunt    Breast cancer Maternal Aunt        70s   Breast cancer Cousin 67   Uterine cancer Cousin 66   Colon polyps Neg Hx    Colon cancer Neg Hx    Esophageal cancer Neg Hx    Stomach cancer Neg Hx     Social History   Socioeconomic History   Marital status: Single    Spouse name: Not on file   Number of children: Not on file   Years of education: Not on file   Highest education level: Not on file  Occupational History   Not on file  Tobacco Use   Smoking status: Never   Smokeless tobacco: Never  Vaping Use   Vaping status: Never Used  Substance and Sexual Activity   Alcohol use: Not Currently    Alcohol/week: 7.0 standard drinks of alcohol    Types: 7 Standard drinks or equivalent per week    Comment: occassional   Drug use: No   Sexual activity: Not Currently  Other Topics Concern   Not on file  Social History Narrative   Not on file   Social Drivers of Health   Financial Resource Strain: High Risk (01/14/2022)   Overall Financial Resource Strain (CARDIA)    Difficulty of Paying Living Expenses: Very hard  Food Insecurity: No Food Insecurity (12/22/2023)   Hunger Vital Sign    Worried About Running Out of Food in the Last Year: Never true    Ran Out of Food in the Last Year: Never true  Recent Concern: Food Insecurity - Food Insecurity Present (11/24/2023)   Hunger Vital Sign    Worried About Running Out of Food in the Last Year:  Sometimes true    Ran Out of Food in the Last Year: Sometimes true  Transportation Needs: No Transportation Needs (12/22/2023)   PRAPARE - Administrator, Civil Service (Medical): No    Lack of Transportation (Non-Medical): No  Physical Activity: Insufficiently Active (08/01/2023)   Exercise Vital Sign    Days of Exercise per Week: 1 day    Minutes of Exercise per Session: 10 min  Stress: Stress Concern Present (10/10/2023)   [employer] of Occupational Health - Occupational Stress Questionnaire    Feeling of Stress : Rather much  Social Connections: Not on file     Review of Systems: A 12 point ROS discussed and pertinent positives are indicated in  the HPI above.  All other systems are negative.  Review of Systems  Constitutional:  Negative for chills, fatigue and fever.  Respiratory:  Negative for cough, shortness of breath and wheezing.   Gastrointestinal:  Negative for diarrhea, nausea and vomiting.  Neurological:  Negative for dizziness and headaches.  Psychiatric/Behavioral:  Negative for agitation, behavioral problems and confusion.     Vital Signs: BP 118/83 (BP Location: Left Arm)   Pulse 91   Temp 98.6 F (37 C) (Oral)   Resp 18   Ht 5' 11 (1.803 m)   Wt 196 lb 10.4 oz (89.2 kg)   SpO2 98%   BMI 27.43 kg/m   Advance Care Plan: The advanced care place/surrogate decision maker was discussed at the time of visit and the patient did not wish to discuss or was not able to name a surrogate decision maker or provide an advance care plan.  Physical Exam Constitutional:      Appearance: She is well-developed.  HENT:     Head: Atraumatic.     Mouth/Throat:     Mouth: Mucous membranes are moist.  Cardiovascular:     Rate and Rhythm: Normal rate and regular rhythm.     Heart sounds: No murmur heard. Pulmonary:     Effort: Pulmonary effort is normal.     Breath sounds: Normal breath sounds.  Abdominal:     General: Bowel sounds are normal.      Palpations: Abdomen is soft.  Musculoskeletal:        General: Normal range of motion.  Skin:    General: Skin is warm.  Neurological:     Mental Status: She is alert and oriented to person, place, and time.  Psychiatric:        Mood and Affect: Mood normal.        Behavior: Behavior normal.     Imaging: CT ABDOMEN PELVIS W CONTRAST Result Date: 01/15/2024 CLINICAL DATA:  Abdominal pain with nausea.  J tube feeding. EXAM: CT ABDOMEN AND PELVIS WITH CONTRAST TECHNIQUE: Multidetector CT imaging of the abdomen and pelvis was performed using the standard protocol following bolus administration of intravenous contrast. RADIATION DOSE REDUCTION: This exam was performed according to the departmental dose-optimization program which includes automated exposure control, adjustment of the mA and/or kV according to patient size and/or use of iterative reconstruction technique. CONTRAST:  OMNIPAQUE  IOHEXOL  300 MG/ML  SOLN COMPARISON:  CT abdomen and pelvis 12/29/2023. FINDINGS: Lower chest: There is atelectasis in the lung bases. There are small bilateral pleural effusions, right greater than left. Hepatobiliary: No focal liver abnormality is seen. No gallstones, gallbladder wall thickening, or biliary dilatation. Pancreas: Fullness of the humeral head appears unchanged from prior. Pancreas otherwise appears within normal limits. Spleen: Normal in size without focal abnormality. Adrenals/Urinary Tract: Subcentimeter cysts are identified in the left kidney. There is mild right-sided hydronephrosis without obstructing calculus identified. There is no left-sided hydronephrosis. The adrenal glands and bladder are within normal limits. The bladder is distended. Stomach/Bowel: The appendix appears normal. There is no free intraperitoneal air. There is diffuse colonic diverticulosis. Percutaneous jejunostomy tube in place. There is some mildly dilated jejunal loop centrally without transition point. Contrast is  seen beyond dilated small bowel. Percutaneous gastrostomy tube in place. Stomach is nondilated. Vascular/Lymphatic: No significant vascular findings are present. No enlarged abdominal or pelvic lymph nodes. Reproductive: Status post hysterectomy. No adnexal masses. Other: There is a small amount of free fluid in the abdomen and pelvis. There is  some air within the anterior right abdominal wall. There is diffuse body wall edema. Musculoskeletal: Sclerotic osseous metastatic disease and chronic compression fracture of L3 appear unchanged. IMPRESSION: 1. Percutaneous jejunostomy tube in place. There is some mildly dilated jejunal loop centrally without transition point. Findings may represent ileus or partial small bowel obstruction. 2. Small amount of free fluid in the abdomen and pelvis. 3. Small bilateral pleural effusions, right greater than left. 4. Diffuse body wall edema. 5. Mild right-sided hydronephrosis without obstructing calculus identified. 6. Distended bladder. 7. Colonic diverticulosis. 8. Sclerotic osseous metastatic disease. Electronically Signed   By: Greig Pique M.D.   On: 01/15/2024 19:45   DG Abd Portable 1V Result Date: 01/09/2024 CLINICAL DATA:  Vomiting for the past 2 days. EXAM: PORTABLE ABDOMEN - 1 VIEW COMPARISON:  01/08/2024 FINDINGS: Normal bowel-gas pattern. Mild-to-moderate lumbar spine degenerative changes. Postsurgical changes in the left mid and upper abdomen. IMPRESSION: No acute abnormality. Electronically Signed   By: Elspeth Bathe M.D.   On: 01/09/2024 10:18   DG Abd Portable 1V Result Date: 01/08/2024 CLINICAL DATA:  Ileus EXAM: PORTABLE ABDOMEN - 1 VIEW COMPARISON:  Upper GI 01/06/2024.  CT 12/29/2023 FINDINGS: Gas is seen in nondilated loops of large bowel. Scattered stool. Air towards the rectum. Minimal small bowel gas. Nonspecific. No obstruction. No obvious free air on this portable supine radiograph. Of note the diaphragm is clipped off the edge of the film.  Degenerative changes along the spine. IMPRESSION: Nonspecific bowel gas pattern.  Overall minimal small bowel gas. Electronically Signed   By: Ranell Bring M.D.   On: 01/08/2024 10:27   DG UGI W SINGLE CM (SOL OR THIN BA) Result Date: 01/06/2024 CLINICAL DATA:  53 year old female with a history of metastatic breast cancer who presented to the ED with several days of nausea and vomiting. Imaging revealed duodenal obstruction from mass-like area of the pancreas. Patient is currently 4 days s/p laparoscopic creation of gastrojejunostomy with continued nausea and vomiting. Request for UGI. EXAM: DG UGI W SINGLE CM TECHNIQUE: Single contrast examination was then performed using thin liquid barium. This exam was performed by [facility] PA-C, and was supervised and interpreted by Dr. Newell Eke. FLUOROSCOPY: Radiation Exposure Index (as provided by the fluoroscopic device): 33.9 mGy Kerma COMPARISON:  CT A/P W CONTRAST - 12/29/23. FINDINGS: Esophagus:  Grossly normal. Esophageal motility:  Within normal limits. Gastroesophageal reflux:  None visualized. Stomach: Gastrojejunostomy without leak. Overall poor opacification of the stomach with further assessment limited by limited contrast ingestion. Gastric emptying: Not assessed. Duodenum:  Surgically absent.  Gastrojejunostomy. Other:  None. IMPRESSION: Expected postoperative changes after gastrojejunostomy. No evidence of contrast leak or extravasation. Electronically Signed   By: Newell Eke M.D.   On: 01/06/2024 13:09   DG CHEST PORT 1 VIEW Result Date: 01/03/2024 EXAM: 1 VIEW XRAY OF THE CHEST 01/03/2024 09:48:00 PM COMPARISON: Radiographs Dec 10 2023 CLINICAL HISTORY: Encounter for central line care. PICC line position. FINDINGS: LUNGS AND PLEURA: The lungs are clear. No pneumothorax. HEART AND MEDIASTINUM: Stable cardiomediastinal silhouette. BONES AND SOFT TISSUES: No acute osseous abnormality. LINES AND TUBES: Right PICC tip in the low SVC.  IMPRESSION: 1. Right PICC tip in the low SVC. 2. Clear lungs without pneumothorax. Electronically signed by: Norman Gatlin MD 01/03/2024 09:57 PM EDT RP Workstation: [id number]   DG Abd Portable 1V Result Date: 12/30/2023 CLINICAL DATA:  NG tube placement EXAM: PORTABLE ABDOMEN - 1 VIEW COMPARISON:  CT abdomen pelvis 12/29/2023 FINDINGS: Enteric tube tip  and side-port in the stomach. Gaseous distention of loops of bowel in the left upper abdomen similar to prior CT. IMPRESSION: Enteric tube tip and side-port in the stomach. Electronically Signed   By: Norman Gatlin M.D.   On: 12/30/2023 17:38   CT ABDOMEN PELVIS W CONTRAST Result Date: 12/29/2023 CLINICAL DATA:  Breast cancer. Epigastric pain. Pancreatic head lesions suspected on MRI, not well corroborated on endoscopy and endoscopic ultrasound. * Tracking Code: BO * EXAM: CT ABDOMEN AND PELVIS WITH CONTRAST TECHNIQUE: Multidetector CT imaging of the abdomen and pelvis was performed using the standard protocol following bolus administration of intravenous contrast. RADIATION DOSE REDUCTION: This exam was performed according to the departmental dose-optimization program which includes automated exposure control, adjustment of the mA and/or kV according to patient size and/or use of iterative reconstruction technique. CONTRAST:  OMNIPAQUE  IOHEXOL  300 MG/ML  SOLN COMPARISON:  Numerous exams including MRI abdomen 12/23/2023; CT scans from 12/22/2023, 12/10/2023, 12/06/2023, and 11/23/2023; and multiple PET scans including 11/24/2023 FINDINGS: Lower chest: Small type 1 hiatal hernia. Hepatobiliary: 3 mm simple cyst in the right hepatic lobe on image 32 series 2, as shown on recent MRI. Gallbladder unremarkable. CBD 5 mm in diameter, within normal limits. Pancreas: Equivocal fullness along the right side of the pancreatic head without abnormal hypoenhancement currently. Borderline prominent dorsal pancreatic duct in the pancreatic body. Indistinct margins  of the pancreatic head with the adjacent duodenum, potentially from local inflammatory process, difficult to confidently exclude the possibility of an infiltrative lesion. Slightly kinked appearance at the junction of the descending and transverse duodenum at the site where further passing of the endoscope was not feasible. Stricturing of the duodenum in this vicinity is not excluded. Poor definition of the ampullary region. This area was not overtly hypermetabolic on the PET-CT from 1 month ago. Spleen: Unremarkable Adrenals/Urinary Tract: Tiny renal cysts or no further imaging workup. Adrenal glands normal. Stomach/Bowel: As noted above, there is potentially a duodenal stricture at the junction of the second and third portions with poor definition of fat planes between the pancreatic head in this segment of the duodenum Colonic diverticulosis.  No findings of active diverticulitis. Vascular/Lymphatic: Unremarkable.  No porta hepatis adenopathy. Reproductive: Uterus absent.  Adnexa unremarkable. Other: Small amount of free pelvic fluid. Musculoskeletal: Widespread sclerotic lesions in the skeleton compatible with osseous metastatic disease. Stable remote L3 compression fracture. IMPRESSION: 1. Equivocal fullness along the right side of the pancreatic head without abnormal hypoenhancement currently. Ill definition of borders between the pancreatic head and adjacent portion of the duodenum. I do not see a distinct mass and PET scan was negative in this region 1 month ago. As such, the current appearance could be inflammatory or postinflammatory in nature, correlate with lipase levels. Given the somewhat equivocal findings on MRI and endoscopic ultrasound, surveillance imaging may be indicated. 2. Small amount of free pelvic fluid. 3. Widespread sclerotic lesions in the skeleton compatible with osseous metastatic disease. 4. Small type 1 hiatal hernia. 5. Colonic diverticulosis. Electronically Signed   By: Ryan Salvage M.D.   On: 12/29/2023 15:56   DG Abd Portable 1V Result Date: 12/28/2023 CLINICAL DATA:  Initial evaluation for acute abdominal pain. Vomiting. EXAM: PORTABLE ABDOMEN - 1 VIEW COMPARISON:  Radiograph from 12/24/2023. FINDINGS: Mild gaseous distension of the stomach. Bowel gas pattern is nonobstructive. No appreciable bowel wall thickening. No soft tissue mass or abnormal calcification. No visible free air. Mild lumbar levoscoliosis with multilevel degenerative spondylosis. Visualized osseous structures demonstrate no  acute finding. IMPRESSION: 1. Mild gaseous distension of the stomach. 2. Otherwise negative abdominal radiographs with overall nonobstructive bowel gas pattern. Electronically Signed   By: Morene Hoard M.D.   On: 12/28/2023 19:58   US  EKG SITE RITE Result Date: 12/27/2023 If Site Rite image not attached, placement could not be confirmed due to current cardiac rhythm.  DG C-Arm 1-60 Min-No Report Result Date: 12/27/2023 Fluoroscopy was utilized by the requesting physician.  No radiographic interpretation.   DG Abd Portable 1V Result Date: 12/24/2023 CLINICAL DATA:  Intractable nausea, vomiting EXAM: PORTABLE ABDOMEN - 1 VIEW COMPARISON:  12/12/2023.  CT 12/22/2023 FINDINGS: The bowel gas pattern is normal. No radio-opaque calculi or other significant radiographic abnormality are seen. IMPRESSION: Negative. Electronically Signed   By: Franky Crease M.D.   On: 12/24/2023 19:26   MR ABDOMEN MRCP W WO CONTAST Result Date: 12/23/2023 CLINICAL DATA:  Pancreatitis suspected, elevated LFTs, query pancreatic mass, history of metastatic breast cancer EXAM: MRI ABDOMEN WITHOUT AND WITH CONTRAST (INCLUDING MRCP) TECHNIQUE: Multiplanar multisequence MR imaging of the abdomen was performed both before and after the administration of intravenous contrast. Heavily T2-weighted images of the biliary and pancreatic ducts were obtained, and three-dimensional MRCP images were rendered by post  processing. CONTRAST:  7mL GADAVIST  GADOBUTROL  1 MMOL/ML IV SOLN COMPARISON:  CT chest abdomen pelvis, 12/22/2023, PET-CT, 11/24/2023, 06/10/2023 FINDINGS: Lower chest: No acute abnormality. Hepatobiliary: No solid liver abnormality is seen. No gallstones or gallbladder wall thickening. Common bile duct at the upper limit of normal in caliber measuring up to 0.7 cm (series 3, image 16). Pancreas: Subtly expansile, masslike appearance of the pancreatic head and uncinate as seen by prior CT, measuring 2.5 x 2.3 cm (series 4, image 20). There is however minimal if any associated hypoenhancement and no underlying diffusion restriction. The central pancreatic duct and central common bile duct are effaced at this level just above the ampulla. Notably, this appearance has significantly changed in the interval to prior restaging PET-CT examination dated 06/10/2023. Prominence of the pancreatic duct measuring up to 0.4 cm. Spleen: Normal in size without significant abnormality. Adrenals/Urinary Tract: Adrenal glands are unremarkable. Multiple simple renal cortical cysts, benign, requiring no further follow-up or characterization kidneys are otherwise normal, without obvious renal calculi, solid lesion, or hydronephrosis. Stomach/Bowel: Stomach is within normal limits. No evidence of bowel wall thickening, distention, or inflammatory changes. Pancolonic diverticulosis. Vascular/Lymphatic: No significant vascular findings are present. No enlarged abdominal lymph nodes. Other: No abdominal wall hernia or abnormality. No ascites. Musculoskeletal: No acute osseous findings. Heterogeneously enhancing vertebral metastases (series 20, image 20). IMPRESSION: 1. Subtly expansile, masslike appearance of the pancreatic head and uncinate as seen by prior CT, measuring 2.5 x 2.3 cm. 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 2. Common bile duct  at the upper limit of normal in caliber measuring up to 0.7 cm. No gallstones. 3. Prominence of the pancreatic duct measuring up to 0.4 cm. 4. Heterogeneously enhancing vertebral metastases, in keeping with known metastatic breast cancer. 5. Pancolonic diverticulosis. Electronically Signed   By: Marolyn JONETTA Jaksch M.D.   On: 12/23/2023 20:20   MR 3D Recon At Scanner Result Date: 12/23/2023 CLINICAL DATA:  Pancreatitis suspected, elevated LFTs, query pancreatic mass, history of metastatic breast cancer EXAM: MRI ABDOMEN WITHOUT AND WITH CONTRAST (INCLUDING MRCP) TECHNIQUE: Multiplanar multisequence MR imaging of the abdomen was performed both before and after the administration of intravenous contrast. Heavily  T2-weighted images of the biliary and pancreatic ducts were obtained, and three-dimensional MRCP images were rendered by post processing. CONTRAST:  7mL GADAVIST  GADOBUTROL  1 MMOL/ML IV SOLN COMPARISON:  CT chest abdomen pelvis, 12/22/2023, PET-CT, 11/24/2023, 06/10/2023 FINDINGS: Lower chest: No acute abnormality. Hepatobiliary: No solid liver abnormality is seen. No gallstones or gallbladder wall thickening. Common bile duct at the upper limit of normal in caliber measuring up to 0.7 cm (series 3, image 16). Pancreas: Subtly expansile, masslike appearance of the pancreatic head and uncinate as seen by prior CT, measuring 2.5 x 2.3 cm (series 4, image 20). There is however minimal if any associated hypoenhancement and no underlying diffusion restriction. The central pancreatic duct and central common bile duct are effaced at this level just above the ampulla. Notably, this appearance has significantly changed in the interval to prior restaging PET-CT examination dated 06/10/2023. Prominence of the pancreatic duct measuring up to 0.4 cm. Spleen: Normal in size without significant abnormality. Adrenals/Urinary Tract: Adrenal glands are unremarkable. Multiple simple renal cortical cysts, benign, requiring no further  follow-up or characterization kidneys are otherwise normal, without obvious renal calculi, solid lesion, or hydronephrosis. Stomach/Bowel: Stomach is within normal limits. No evidence of bowel wall thickening, distention, or inflammatory changes. Pancolonic diverticulosis. Vascular/Lymphatic: No significant vascular findings are present. No enlarged abdominal lymph nodes. Other: No abdominal wall hernia or abnormality. No ascites. Musculoskeletal: No acute osseous findings. Heterogeneously enhancing vertebral metastases (series 20, image 20). IMPRESSION: 1. Subtly expansile, masslike appearance of the pancreatic head and uncinate as seen by prior CT, measuring 2.5 x 2.3 cm. 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 2. Common bile duct at the upper limit of normal in caliber measuring up to 0.7 cm. No gallstones. 3. Prominence of the pancreatic duct measuring up to 0.4 cm. 4. Heterogeneously enhancing vertebral metastases, in keeping with known metastatic breast cancer. 5. Pancolonic diverticulosis. Electronically Signed   By: Marolyn JONETTA Jaksch M.D.   On: 12/23/2023 20:20   US  Abdomen Limited RUQ (LIVER/GB) Result Date: 12/23/2023 CLINICAL DATA:  Elevated LFTs EXAM: ULTRASOUND ABDOMEN LIMITED RIGHT UPPER QUADRANT COMPARISON:  None Available. FINDINGS: Gallbladder: Patient experienced some pain during the exam. The sonographer did not no whether this constituted a sonographic Murphy's sign. The sonographer is unable to be reached at this time. Borderline wall thickening (3 mm). No cholelithiasis. No pericholecystic fluid. Common bile duct: Diameter: 5 mm Liver: No focal lesion identified. Within normal limits in parenchymal echogenicity. Portal vein is patent on color Doppler imaging with normal direction of blood flow towards the liver. Other: None. IMPRESSION: 1. Borderline gallbladder wall thickening. No cholelithiasis or  pericholecystic fluid. Patient had pain during the exam but it is unclear if this constitutes a sonographic Murphy's sign. If there is concern for acute cholecystitis consider HIDA scan for further evaluation. 2. No biliary dilatation. Electronically Signed   By: Norman Gatlin M.D.   On: 12/23/2023 00:32   CT CHEST ABDOMEN PELVIS WO CONTRAST Result Date: 12/22/2023 CLINICAL DATA:  Bowel obstruction suspected nausea, vomiting, abdominal pain, and per cancer center hypotension. Pt has not been able to keep food or drink down. Stated she did an enema on Monday and only had a small amount of output. Also stated she has lost almost 20lbs EXAM: CT CHEST, ABDOMEN AND PELVIS WITHOUT CONTRAST TECHNIQUE: Multidetector CT imaging of the chest, abdomen and pelvis was performed following the standard protocol without IV contrast. RADIATION  DOSE REDUCTION: This exam was performed according to the departmental dose-optimization program which includes automated exposure control, adjustment of the mA and/or kV according to patient size and/or use of iterative reconstruction technique. COMPARISON:  CT chest 12/12/2023, CT abdomen pelvis 12/10/2023, PET CT 11/23/2023, CT abdomen pelvis 11/17/2022 FINDINGS: CT CHEST FINDINGS Cardiovascular: Normal heart size. No significant pericardial effusion. The thoracic aorta is normal in caliber. No atherosclerotic plaque of the thoracic aorta. No coronary artery calcifications. Mediastinum/Nodes: No gross hilar adenopathy, noting limited sensitivity for the detection of hilar adenopathy on this noncontrast study. No enlarged mediastinal or axillary lymph nodes. Thyroid  gland, trachea, and esophagus demonstrate no significant findings. Lungs/Pleura: Similar-appearing bilateral upper lung zone, right greater than left, centrilobular pulmonary micronodules. No pulmonary mass. No pleural effusion. No pneumothorax. Musculoskeletal: Postsurgical changes of the bilateral breasts. No suspicious  lytic or blastic osseous lesions. No acute displaced fracture. Multilevel degenerative changes of the spine. CT ABDOMEN PELVIS FINDINGS Hepatobiliary: No focal liver abnormality. No gallstones, gallbladder wall thickening, or pericholecystic fluid. No biliary dilatation. Pancreas: Persistent vague fat stranding along the proximal pancreas with query underlying vague mass (2:68). No surrounding inflammatory changes. No main pancreatic ductal dilatation. Spleen: Normal in size without focal abnormality. Adrenals/Urinary Tract: No adrenal nodule bilaterally. No nephrolithiasis and no hydronephrosis. No definite contour-deforming renal mass. No ureterolithiasis or hydroureter. The urinary bladder is unremarkable. Stomach/Bowel: Stomach is within normal limits. No evidence of bowel wall thickening or dilatation. Appendix appears normal. Vascular/Lymphatic: No abdominal aorta or iliac aneurysm. No abdominal, pelvic, or inguinal lymphadenopathy. Reproductive: Status post hysterectomy. No adnexal masses. Other: No intraperitoneal free fluid. No intraperitoneal free gas. No organized fluid collection. Musculoskeletal: No abdominal wall hernia or abnormality. Persistent scattered axial and appendicular sclerotic osseous metastatic lesions. L3 vertebral body hemangioma with stable chronic vertebral body height loss. No acute displaced fracture. Multilevel degenerative changes of the spine. IMPRESSION: 1. Limited evaluation on this noncontrast study. 2. Similar-appearing bilateral upper lung zone, right greater than left, centrilobular pulmonary micronodules. Finding may represent infection/inflammation. Underlying malignancy not excluded. 3. Persistent vague fat stranding along the proximal pancreas with query underlying vague mass. Correlate with lipase levels. If clinically indicated, consider MRI pancreatic protocol for further evaluation. 4. Persistent scattered axial and appendicular sclerotic osseous metastatic lesions.  5. Colonic diverticulosis with no acute diverticulitis. Electronically Signed   By: Morgane  Naveau M.D.   On: 12/22/2023 17:55    Labs:  CBC: Recent Labs    01/12/24 0330 01/13/24 0437 01/15/24 0306 01/16/24 0509  WBC 4.2 4.2 4.5 6.6  HGB 7.9* 7.9* 7.1* 7.2*  HCT 25.5* 25.0* 22.6* 23.2*  PLT 178 177 176 182    COAGS: Recent Labs    12/10/23 2004 12/22/23 2233  INR 1.1 1.2  APTT 28  --     BMP: Recent Labs    01/13/24 0437 01/14/24 0118 01/15/24 0306 01/16/24 0509  NA 137 138 137 136  K 3.8 4.1 4.1 3.8  CL 110 111 109 107  CO2 20* 21* 21* 22  GLUCOSE 113* 131* 140* 120*  BUN 16 15 15 11   CALCIUM  6.9* 7.0* 6.9* 7.2*  CREATININE 0.38* 0.50 0.43* 0.41*  GFRNONAA >60 >60 >60 >60    LIVER FUNCTION TESTS: Recent Labs    01/09/24 0346 01/11/24 0210 01/12/24 0330 01/13/24 0437 01/16/24 0509  BILITOT 0.6  --  0.7 0.6 0.9  AST 26  --  27 25 26   ALT 31  --  25 22 23   ALKPHOS 823*  --  841* 772* 703*  PROT 6.2*  --  6.1* 6.1* 5.8*  ALBUMIN  2.8* 2.9* 2.7* 2.6* 2.3*    TUMOR MARKERS: No results for input(s): AFPTM, CEA, CA199, CHROMGRNA in the last 8760 hours.  Assessment and Plan:  Patient is with pancreatic mass with duodenal stricture/obstruction scheduled for image guided celiac plexus block 01/16/2024.  Imaging was reviewed and approved by Dr. Johann.  Risks and benefits of celiac plexus block were discussed with the patient including allergic reaction, decreased blood flow to the spinal cord/paralysis, gastroparesis, nerve damage, bleeding, infection, and damage to adjacent structures.  All of the patient's questions were answered, patient is agreeable to proceed. Consent signed and in chart.  Thank you for allowing our service to participate in Chelsea Hansen 's care.  Electronically Signed: Lavanda JAYSON Jurist, PA-C   01/16/2024, 11:42 AM    I spent a total of 40 Minutes    in face to face in clinical consultation, greater than 50% of  which was counseling/coordinating care for image guided celiac plexus block.

## 2024-01-16 NOTE — Progress Notes (Signed)
 PT Cancellation Note  Patient Details Name: Chelsea Hansen MRN: [id number] DOB: 22-Mar-1971   Cancelled Treatment:    Reason Eval/Treat Not Completed: Other (comment) RN and NT to assist with pericare and linen change as pt with loose stool in bed.  Per RN and notes, NPO for IR procedure -- plexxus block for pain  planned for today.  Will check back as schedule permits.   Kati L Payson 01/16/2024, 11:33 AM

## 2024-01-16 NOTE — Progress Notes (Signed)
 PHARMACY - TOTAL PARENTERAL NUTRITION CONSULT NOTE   Indication: Pancreatic mass with duodenal stricture/obstruction  Patient Measurements: Height: 5' 11 (180.3 cm) Weight: 89.2 kg (196 lb 10.4 oz) IBW/kg (Calculated) : 70.8 TPN AdjBW (KG): 81.9 Body mass index is 27.43 kg/m. Usual Weight: 92.5 kg in April of this year  Assessment:  Pharmacy consulted to manage TPN for  53 yo needing nutritional support while awaiting surgical intervention for pancreatic mass with duodenal stricture/obstruction in the setting of metastatic breast cancer resulting in poor oral intake, persistent nausea and vomiting, and weight loss.  S/p venting Gtube placement on 7/22 with plans for Jtube feedings.    Glucose / Insulin :  No Hx DM. BG remains < 150.  - SSI/CBGs  stopped on 7/19 - Dexamethasone  4mg  IV q12h (7/26 >> ) Electrolytes: Elytes WNL, except CorrCa 8.56 (got IV Ca gluconate 7/25, 7/26, 7/27, Phos up to 2.5 after 30 mM Naphos,  Renal: SCr and BUN OK Hepatic: albumin  remains low; alk phos remains elevated 703 (7/28); transaminases and bilirubin remain WNL; triglycerides  274 ( 7/21) >> 99 (7/28)   Intake / Output:  - mIVF: NS at 75 ml/hr.  - Output:  drains 900 mL, emesis 200 mL, stool x 2, urine 2200 mL GI Imaging: 7/4 MR abdomen MRCP: pancreatic mass, vertebral metastases, pancolonic diverticulosis 7/10 CT: pancreatic head equivocal fullness, small amount free pelvic fluid, widespread osseous metastatic disease, small hiatal hernia, colonic diverticulosis GI Surgeries / Procedures:  7/8 EGD 7/7 EUS, upper GI tract 7/14: bypass surgery for creation of G-Junostomy 7/22: Insertion of gastrostomy tube 7/27: CT A/P w/ oral contrast via G/J tube: mildly dilated jejunal loop centrally. Contrast is seen beyond dilated small bowel. may rep ileus or pSBO  Central access: PICC placed 7/9 TPN start date: 7/9  Nutritional Goals:  Goal TPN rate is 90 mL/hr (provides 104 g of protein and 2238 kcals per  day)  RD Assessment: Estimated Needs Total Energy Estimated Needs: 2000-2300kcal/day Total Protein Estimated Needs: 100-115g/day Total Fluid Estimated Needs: >/= 2L  Current Nutrition:  TPN at 1/2 rate Diet: CLD (7/22)>> NPO 7/28 am Tube feeds:  Osmolite, Prosource.  Remains on hold for N/V> DC 7/28  Plan:  Per d/w CCS:  At 1800:  Increase TPN back to full rate at 90 mL/hr Electrolytes in TPN:   Na 150 mEq/L (max), K 45 mEq/L, Ca 10 mEq/L (max), Mg 5 mEq/L, Phos 25 mmol/L ( max).  Cl:Ac 1:2 Add standard MVI and trace elements to TPN At 1800: decr NS 75 ml/hr > 30 ml/hr to keep total IVF 120 ml/hr or per MD Monitor TPN labs on Mon/Thurs, and as needed. BMET, Mg & Phos in AM   Rosaline IVAR Edison, Pharm.D Use secure chat for questions 01/16/2024 9:07 AM

## 2024-01-17 ENCOUNTER — Inpatient Hospital Stay (HOSPITAL_COMMUNITY): Payer: MEDICAID

## 2024-01-17 DIAGNOSIS — Z170 Estrogen receptor positive status [ER+]: Secondary | ICD-10-CM | POA: Diagnosis not present

## 2024-01-17 DIAGNOSIS — R1084 Generalized abdominal pain: Secondary | ICD-10-CM

## 2024-01-17 DIAGNOSIS — C50411 Malignant neoplasm of upper-outer quadrant of right female breast: Secondary | ICD-10-CM

## 2024-01-17 DIAGNOSIS — R978 Other abnormal tumor markers: Secondary | ICD-10-CM | POA: Diagnosis not present

## 2024-01-17 DIAGNOSIS — R112 Nausea with vomiting, unspecified: Secondary | ICD-10-CM | POA: Diagnosis not present

## 2024-01-17 DIAGNOSIS — K315 Obstruction of duodenum: Secondary | ICD-10-CM | POA: Diagnosis not present

## 2024-01-17 DIAGNOSIS — N179 Acute kidney failure, unspecified: Secondary | ICD-10-CM | POA: Diagnosis not present

## 2024-01-17 DIAGNOSIS — R933 Abnormal findings on diagnostic imaging of other parts of digestive tract: Secondary | ICD-10-CM | POA: Diagnosis not present

## 2024-01-17 LAB — CBC
HCT: 23.7 % — ABNORMAL LOW (ref 36.0–46.0)
Hemoglobin: 7.4 g/dL — ABNORMAL LOW (ref 12.0–15.0)
MCH: 23.5 pg — ABNORMAL LOW (ref 26.0–34.0)
MCHC: 31.2 g/dL (ref 30.0–36.0)
MCV: 75.2 fL — ABNORMAL LOW (ref 80.0–100.0)
Platelets: 187 K/uL (ref 150–400)
RBC: 3.15 MIL/uL — ABNORMAL LOW (ref 3.87–5.11)
RDW: 18.4 % — ABNORMAL HIGH (ref 11.5–15.5)
WBC: 8 K/uL (ref 4.0–10.5)
nRBC: 1.5 % — ABNORMAL HIGH (ref 0.0–0.2)

## 2024-01-17 LAB — BASIC METABOLIC PANEL WITH GFR
Anion gap: 9 (ref 5–15)
BUN: 11 mg/dL (ref 6–20)
CO2: 22 mmol/L (ref 22–32)
Calcium: 7.5 mg/dL — ABNORMAL LOW (ref 8.9–10.3)
Chloride: 105 mmol/L (ref 98–111)
Creatinine, Ser: 0.38 mg/dL — ABNORMAL LOW (ref 0.44–1.00)
GFR, Estimated: >60 mL/min (ref >60)
Glucose, Bld: 140 mg/dL — ABNORMAL HIGH (ref 70–99)
Potassium: 4.1 mmol/L (ref 3.5–5.1)
Sodium: 136 mmol/L (ref 135–145)

## 2024-01-17 LAB — GLUCOSE, CAPILLARY
Glucose-Capillary: 124 mg/dL — ABNORMAL HIGH (ref 70–99)
Glucose-Capillary: 131 mg/dL — ABNORMAL HIGH (ref 70–99)
Glucose-Capillary: 139 mg/dL — ABNORMAL HIGH (ref 70–99)
Glucose-Capillary: 141 mg/dL — ABNORMAL HIGH (ref 70–99)
Glucose-Capillary: 145 mg/dL — ABNORMAL HIGH (ref 70–99)

## 2024-01-17 LAB — PHOSPHORUS: Phosphorus: 2.3 mg/dL — ABNORMAL LOW (ref 2.5–4.6)

## 2024-01-17 LAB — CANCER ANTIGEN 19-9: CA 19-9: 229 U/mL — ABNORMAL HIGH (ref 0–35)

## 2024-01-17 LAB — MAGNESIUM: Magnesium: 1.8 mg/dL (ref 1.7–2.4)

## 2024-01-17 MED ORDER — SODIUM PHOSPHATES 45 MMOLE/15ML IV SOLN
15.0000 mmol | Freq: Once | INTRAVENOUS | Status: AC
  Administered 2024-01-17: 15 mmol via INTRAVENOUS
  Filled 2024-01-17: qty 5

## 2024-01-17 MED ORDER — MAGNESIUM SULFATE 2 GM/50ML IV SOLN
2.0000 g | Freq: Once | INTRAVENOUS | Status: AC
  Administered 2024-01-17: 2 g via INTRAVENOUS
  Filled 2024-01-17: qty 50

## 2024-01-17 MED ORDER — CALCIUM GLUCONATE-NACL 1-0.675 GM/50ML-% IV SOLN
1.0000 g | Freq: Once | INTRAVENOUS | Status: AC
  Administered 2024-01-17: 1000 mg via INTRAVENOUS
  Filled 2024-01-17: qty 50

## 2024-01-17 MED ORDER — TRAVASOL 10 % IV SOLN
INTRAVENOUS | Status: DC
  Filled 2024-01-17: qty 1036.8

## 2024-01-17 NOTE — Progress Notes (Signed)
 7 Days Post-Op   Subjective/Chief Complaint: No nausea at present, denies vomiting. Reports a loose stool this AM Cc is drainage around her J tube and ongoing abdominal pain despite plexxus block yesterday  Objective: Vital signs in last 24 hours: Temp:  [98.5 F (36.9 C)-99 F (37.2 C)] 99 F (37.2 C) (07/29 0751) Pulse Rate:  [67-100] 100 (07/29 0751) Resp:  [14-23] 16 (07/29 0751) BP: (110-146)/(72-89) 136/87 (07/29 0751) SpO2:  [97 %-100 %] 98 % (07/29 0751) Last BM Date : 01/16/24  Intake/Output from previous day: 07/28 0701 - 07/29 0700 In: 1659.8 [I.V.:1462; NG/GT:197.8] Out: -  Intake/Output this shift: No intake/output data recorded.  Abdomen soft and moderately tender, incisions clean dry intact with glue G tube to gravity with no output. J tube connected to tube feeds which are currently paused. There is bilious drainage around the J tube.  Lab Results:  Recent Labs    01/16/24 0509 01/17/24 0157  WBC 6.6 8.0  HGB 7.2* 7.4*  HCT 23.2* 23.7*  PLT 182 187   BMET Recent Labs    01/16/24 0509 01/17/24 0157  NA 136 136  K 3.8 4.1  CL 107 105  CO2 22 22  GLUCOSE 120* 140*  BUN 11 11  CREATININE 0.41* 0.38*  CALCIUM  7.2* 7.5*   PT/INR No results for input(s): LABPROT, INR in the last 72 hours. ABG No results for input(s): PHART, HCO3 in the last 72 hours.  Invalid input(s): PCO2, PO2  Studies/Results: CT CELIAC PLEXUS BLOCK ANESTHETIC Result Date: 01/16/2024 CLINICAL DATA:  Severe abdominal pain, incompletely controlled. Concern of possible pancreatic lesion affecting the duodenum, biopsies negative. EXAM: CELIAC PLEXUS BLOCK  UNDER CT GUIDANCE ANESTHESIA/SEDATION: Intravenous Fentanyl  100mcg and Versed  2mg  were administered by RN during a total moderate (conscious) sedation time of 22 minutes; the patient's level of consciousness and physiological / cardiorespiratory status were monitored continuously by radiology RN under my direct  supervision. TECHNIQUE: Select scans through the abdomen were obtained and an appropriate entry sites identified. Moderate sedation was initiated. Skin sites marked, prepped with chlorhexidine , and draped in usual sterile fashion, and infiltrated locally with 1% lidocaine . Under CT fluoroscopic guidance, a 22-gauge Chiba needle was advanced just superior to the origin of the celiac axis, to the left of midline. Diagnostic injection of 5 ml dilute Omnipaque -180 shows good spread in the region of the celiac plexus to the left of midline. In similar fashion, a 22 gauge Chiba needle was advanced to the level of the celiac plexus to the right of midline. Test injection of 5 mL dilute Omnipaque  180 shows good spread. Subsequently 10mL of Sensorcaine  0.5% were then administered, divided evenly between the 2 sites, and the needle was removed. Followup scanning shows dilution and spread of the previous administered contrast bilaterally in the expected region of the celiac plexus. No evidence of hemorrhage or other apparent complication. Patient tolerated the procedure well. IMPRESSION: 1. Technically successful celiac plexus block  under CT guidance. PLAN: If the patient has excellent pain response, and subsequent recurrence of pain, we could proceed with permanent celiac neurolysis. Electronically Signed   By: JONETTA Faes M.D.   On: 01/16/2024 16:28   CT ABDOMEN PELVIS W CONTRAST Result Date: 01/15/2024 CLINICAL DATA:  Abdominal pain with nausea.  J tube feeding. EXAM: CT ABDOMEN AND PELVIS WITH CONTRAST TECHNIQUE: Multidetector CT imaging of the abdomen and pelvis was performed using the standard protocol following bolus administration of intravenous contrast. RADIATION DOSE REDUCTION: This exam was performed  according to the departmental dose-optimization program which includes automated exposure control, adjustment of the mA and/or kV according to patient size and/or use of iterative reconstruction technique. CONTRAST:   OMNIPAQUE  IOHEXOL  300 MG/ML  SOLN COMPARISON:  CT abdomen and pelvis 12/29/2023. FINDINGS: Lower chest: There is atelectasis in the lung bases. There are small bilateral pleural effusions, right greater than left. Hepatobiliary: No focal liver abnormality is seen. No gallstones, gallbladder wall thickening, or biliary dilatation. Pancreas: Fullness of the humeral head appears unchanged from prior. Pancreas otherwise appears within normal limits. Spleen: Normal in size without focal abnormality. Adrenals/Urinary Tract: Subcentimeter cysts are identified in the left kidney. There is mild right-sided hydronephrosis without obstructing calculus identified. There is no left-sided hydronephrosis. The adrenal glands and bladder are within normal limits. The bladder is distended. Stomach/Bowel: The appendix appears normal. There is no free intraperitoneal air. There is diffuse colonic diverticulosis. Percutaneous jejunostomy tube in place. There is some mildly dilated jejunal loop centrally without transition point. Contrast is seen beyond dilated small bowel. Percutaneous gastrostomy tube in place. Stomach is nondilated. Vascular/Lymphatic: No significant vascular findings are present. No enlarged abdominal or pelvic lymph nodes. Reproductive: Status post hysterectomy. No adnexal masses. Other: There is a small amount of free fluid in the abdomen and pelvis. There is some air within the anterior right abdominal wall. There is diffuse body wall edema. Musculoskeletal: Sclerotic osseous metastatic disease and chronic compression fracture of L3 appear unchanged. IMPRESSION: 1. Percutaneous jejunostomy tube in place. There is some mildly dilated jejunal loop centrally without transition point. Findings may represent ileus or partial small bowel obstruction. 2. Small amount of free fluid in the abdomen and pelvis. 3. Small bilateral pleural effusions, right greater than left. 4. Diffuse body wall edema. 5. Mild  right-sided hydronephrosis without obstructing calculus identified. 6. Distended bladder. 7. Colonic diverticulosis. 8. Sclerotic osseous metastatic disease. Electronically Signed   By: Greig Pique M.D.   On: 01/15/2024 19:45       Anti-infectives: Anti-infectives (From admission, onward)    Start     Dose/Rate Route Frequency Ordered Stop   01/13/24 1545  erythromycin  (EES) 400 MG/5ML suspension 400 mg        400 mg Per Tube Every 12 hours 01/13/24 1455     01/10/24 0600  ceFAZolin  (ANCEF ) IVPB 2g/100 mL premix        2 g 200 mL/hr over 30 Minutes Intravenous On call to O.R. 01/09/24 1324 01/10/24 0948   01/02/24 1245  ceFAZolin  (ANCEF ) 2-4 GM/100ML-% IVPB       Note to Pharmacy: Kathern Quant: cabinet override      01/02/24 1245 01/03/24 0059   12/22/23 2230  fluconazole  (DIFLUCAN ) IVPB 200 mg  Status:  Discontinued        200 mg 100 mL/hr over 60 Minutes Intravenous Every 24 hours 12/22/23 2132 01/03/24 1126       Assessment/Plan: Pancreatic mass causing duodenal obstruction/stricture  POD 15- S/P LAPAROSCOPIC CREATION GASTRJEJONOSTOMY, 7/14, Dr. Lyndel  POD 7 - S/P UPPER ENDORSCOPY, LAPAROSCOPY G TUBE INSERTION, LAPAROSCOPIC J TUBE INSTERTION 7/22 Dr. Stevie - continue G tube to gravity, consider clamping tomorrow if remains low output after endoscopy today. - I would like to slowly advance J tube feeds but patient currently refusing, will re-address with her tomorrow; no crushed meds per J tube, only liquids. -CT with PO contrast 7/27 shows no complication with G/J tubes, loop of dilated jejunum without transition point. Contrast seen distal to this bowel loop.  Clinically she is not obstructed, having some flatus and loose stools. - continue full support TPN - noted GI plans for endoscopy today, will follow results.  Pancreatic mass causing duodenal obstruction  -CA 19-9 is 465 - EUS 7/7 w/ bx that were nonspecific.   Metastatic breast cancer w/ mets to  bone    FEN - NPO for IR procedure -- plexxus block for pain VTE - ok for chemical prophylaxis, SCDs HTN HLD protein calorie nutrition - PICC, TPN   I reviewed hospitalist notes, last 24 h vitals and pain scores, last 48 h intake and output, last 24 h labs and trends, and last 24 h imaging results.   Almarie GORMAN Pringle, PA-C  01/17/2024

## 2024-01-17 NOTE — Progress Notes (Signed)
 PICC line was removed per order due to pt requesting to leave AMA; there were no complications.  Pt supine and flat, suspended inspiration during removal with instructions to remain supine and as flat as able to tolerate for 30 minutes after removal.  Pressure held to achieve hemostasis.  Vaseline/gauze/tegaderm applied.  Patient education provided regarding lifting restrictions, site care, and signs of infection.  Pt verbalized understanding and had no questions.

## 2024-01-17 NOTE — Anesthesia Preprocedure Evaluation (Addendum)
 Anesthesia Evaluation  Patient identified by MRN, date of birth, ID band Patient awake    Reviewed: Allergy & Precautions, NPO status , Patient's Chart, lab work & pertinent test results  History of Anesthesia Complications Negative for: history of anesthetic complications  Airway Mallampati: I  TM Distance: >3 FB Neck ROM: Full    Dental  (+) Dental Advisory Given, Chipped   Pulmonary asthma    Pulmonary exam normal        Cardiovascular hypertension, Pt. on medications + DVT  Normal cardiovascular exam+ dysrhythmias Supra Ventricular Tachycardia      Neuro/Psych  Headaches PSYCHIATRIC DISORDERS Anxiety Depression       GI/Hepatic Neg liver ROS,GERD  Controlled,, Dysphagia S/p gastric bypass    Endo/Other  negative endocrine ROS    Renal/GU negative Renal ROS     Musculoskeletal  Bone metastases     Abdominal   Peds  Hematology  (+) Blood dyscrasia, anemia   Anesthesia Other Findings   Reproductive/Obstetrics  Breast cancer w/ mets s/p hysterectomy                               Anesthesia Physical Anesthesia Plan  ASA: 3  Anesthesia Plan: MAC   Post-op Pain Management:    Induction: Intravenous  PONV Risk Score and Plan: 3 and Treatment may vary due to age or medical condition and Propofol  infusion  Airway Management Planned: Simple Face Mask and Natural Airway  Additional Equipment: None  Intra-op Plan:   Post-operative Plan: Extubation in OR  Informed Consent: I have reviewed the patients History and Physical, chart, labs and discussed the procedure including the risks, benefits and alternatives for the proposed anesthesia with the patient or authorized representative who has indicated his/her understanding and acceptance.     Dental advisory given  Plan Discussed with: CRNA and Anesthesiologist  Anesthesia Plan Comments:          Anesthesia Quick  Evaluation

## 2024-01-17 NOTE — Plan of Care (Incomplete)
  Problem: Education: Goal: Knowledge of General Education information will improve Description: Including pain rating scale, medication(s)/side effects and non-pharmacologic comfort measures Outcome: Progressing   Problem: Health Behavior/Discharge Planning: Goal: Ability to manage health-related needs will improve Outcome: Progressing   Problem: Clinical Measurements: Goal: Ability to maintain clinical measurements within normal limits will improve Outcome: Progressing Goal: Will remain free from infection Outcome: Progressing Goal: Diagnostic test results will improve Outcome: Progressing   Problem: Activity: Goal: Risk for activity intolerance will decrease Outcome: Progressing   Problem: Coping: Goal: Level of anxiety will decrease Outcome: Progressing   Problem: Pain Managment: Goal: General experience of comfort will improve and/or be controlled Outcome: Progressing   Problem: Safety: Goal: Ability to remain free from injury will improve Outcome: Progressing   Problem: Skin Integrity: Goal: Risk for impaired skin integrity will decrease Outcome: Progressing   Problem: Nutrition: Goal: Adequate nutrition will be maintained Outcome: Not Progressing

## 2024-01-17 NOTE — Progress Notes (Addendum)
 Referring Provider(s): Cooper, Josseline   Supervising Physician: Johann Sieving  Patient Status:  [facility] - In-pt  Chief Complaint:  Pancreatic mass with duodenal stricture/obstruction s/p image guided celiac plexus block 01/16/24 Dr. Johann   Subjective:  Pt states that she is feeling better today since the celiac plexus block.  Pt states pain has decreased by 50% since the block yesterday.  All pt questions answered.    Allergies: Patient has no known allergies.  Medications: Prior to Admission medications   Medication Sig Start Date End Date Taking? Authorizing Provider  albuterol  (VENTOLIN  HFA) 108 (90 Base) MCG/ACT inhaler Inhale 2 puffs by mouth into the lungs every 6 hours as needed for wheezing or shortness of breath. 03/18/23  Yes [name], NP  anastrozole  (ARIMIDEX ) 1 MG tablet Take 1 tablet (1 mg total) by mouth daily. 04/18/23  Yes Causey, Morna Pickle, NP  calcitRIOL  (ROCALTROL ) 0.5 MCG capsule Take 1 capsule (0.5 mcg total) by mouth daily. 12/15/23  Yes [name], [name], MD  Calcium  Carb-Cholecalciferol  600-10 MG-MCG TABS Take 1 tablet by mouth 3 (three) times daily. 12/14/23 01/13/24 Yes [name], [name], MD  gabapentin  (NEURONTIN ) 300 MG capsule Take 1 capsule (300 mg total) by mouth 3 (three) times daily. 05/26/23  Yes [name], [name], MD  montelukast  (SINGULAIR ) 10 MG tablet Take 1 tablet (10 mg total) by mouth at bedtime. 05/01/23  Yes [name], NP  oxyCODONE -acetaminophen  (PERCOCET) 7.5-325 MG tablet Take 1-2 tablets by mouth every 6 (six) hours as needed for severe pain (pain score 7-10). 12/21/23  Yes [name], [name], NP  pantoprazole  (PROTONIX ) 40 MG tablet Take 1 tablet (40 mg total) by mouth 2 (two) times daily. 12/14/23  Yes [name], [name], MD  polyethylene glycol powder (GLYCOLAX /MIRALAX ) 17 GM/SCOOP powder Mix 17 g in 4 oz of liquid and take by mouth 2 (two) times daily as needed. Patient taking differently: Take 17  g by mouth 2 (two) times daily as needed (for constipation). 11/30/23  Yes [name], NP  prochlorperazine  (COMPAZINE ) 10 MG tablet Take 1 tablet (10 mg total) by mouth every 6 (six) hours as needed for nausea or vomiting. 12/07/23  Yes [name], Ripudeep K, MD  scopolamine  (TRANSDERM-SCOP) 1 MG/3DAYS Place 1 patch (1.5 mg total) onto the skin every 3 (three) days as needed (Dizziness, nausea or vomiting). 12/07/23  Yes [name], Ripudeep K, MD  sennosides-docusate sodium  (SENOKOT-S) 8.6-50 MG tablet Take 1 tablet by mouth daily. 11/30/23  Yes [name], NP  abemaciclib  (VERZENIO ) 50 MG tablet Take 50 mg by mouth 2 (two) times daily. Patient not taking: Reported on 12/22/2023    [provider]  propranolol  (INDERAL ) 10 MG tablet Take 1 tablet (10 mg total) by mouth 2 (two) times daily. Please call [phone] to schedule an appointment for future refills. Thank you. 3rd attempt. Patient not taking: Reported on 12/13/2023 06/01/23   [name], [name], DO  senna (SENOKOT) 8.6 MG TABS tablet Take 2 tablets (17.2 mg total) by mouth at bedtime. Patient not taking: Reported on 12/22/2023 06/30/22   Davia [name], MD     Vital Signs: BP 136/87 (BP Location: Left Arm)   Pulse 100   Temp 99 F (37.2 C) (Oral)   Resp 16   Ht 5' 11 (1.803 m)   Wt 196 lb 10.4 oz (89.2 kg)   SpO2 98%   BMI 27.43 kg/m   Physical Exam Constitutional:      Appearance: She is well-developed.  HENT:     Head: Normocephalic and atraumatic.  Pulmonary:     Effort: Pulmonary effort is normal.     Breath sounds: Normal breath sounds.  Musculoskeletal:        General: Normal range of motion.  Skin:    General: Skin is warm and dry.  Neurological:     Mental Status: She is alert and oriented to person, place, and time.  Psychiatric:        Mood and Affect: Mood normal.        Behavior: Behavior normal.    Labs:  CBC: Recent Labs    01/13/24 0437 01/15/24 0306 01/16/24 0509 01/17/24 0157  WBC 4.2  4.5 6.6 8.0  HGB 7.9* 7.1* 7.2* 7.4*  HCT 25.0* 22.6* 23.2* 23.7*  PLT 177 176 182 187    COAGS: Recent Labs    12/10/23 2004 12/22/23 2233  INR 1.1 1.2  APTT 28  --     BMP: Recent Labs    01/14/24 0118 01/15/24 0306 01/16/24 0509 01/17/24 0157  NA 138 137 136 136  K 4.1 4.1 3.8 4.1  CL 111 109 107 105  CO2 21* 21* 22 22  GLUCOSE 131* 140* 120* 140*  BUN 15 15 11 11   CALCIUM  7.0* 6.9* 7.2* 7.5*  CREATININE 0.50 0.43* 0.41* 0.38*  GFRNONAA >60 >60 >60 >60    LIVER FUNCTION TESTS: Recent Labs    01/09/24 0346 01/11/24 0210 01/12/24 0330 01/13/24 0437 01/16/24 0509  BILITOT 0.6  --  0.7 0.6 0.9  AST 26  --  27 25 26   ALT 31  --  25 22 23   ALKPHOS 823*  --  841* 772* 703*  PROT 6.2*  --  6.1* 6.1* 5.8*  ALBUMIN  2.8* 2.9* 2.7* 2.6* 2.3*    Assessment and Plan:  Pancreatic mass with duodenal stricture/obstruction s/p image guided celiac plexus block 01/16/24 Dr. Johann.    Injection site is clean, dry, without signs of infection.  Dressed appropriately.    Due to the anesthetics 50% decrease in pain, pt is a good candidate for neurolysis.   Pt would like to have the neurolysis outpatient, I have placed an outpatient order.  An IR scheduler will call her to set up the procedure.      Please call IR with any questions.    Electronically Signed: Lavanda JAYSON Jurist, PA-C 01/17/2024, 1:14 PM    I spent a total of 15 Minutes at the the patient's bedside AND on the patient's hospital floor or unit, greater than 50% of which was counseling/coordinating care for image guided celiac plexus block.

## 2024-01-17 NOTE — Progress Notes (Signed)
 Physical Therapy Treatment Patient Details Name: Chelsea Hansen MRN: [id number] DOB: 11-20-1970 Today's Date: 01/17/2024   History of Present Illness Pt is a 53 y.o. female presenting with nausea and vomiting and admitted for acute kidney injury on 12/22/23.  Pt S/P LAPAROSCOPIC CREATION GASTRJEJONOSTOMY 01/01/24. s/p upper endoscopy, laparoscopic g tube insertion, laparoscopic j tube insertion on 7/22. Past medical history significant of breast cancer, Duodenal stenosis, esophagitis Constipation, HLD, HTN , anemia, DVT, asthma, SVT.    PT Comments  Pt seen for PT tx with pt agreeable. Pt is able to complete supine>sit with supervision, sit>supine with min assist to elevate RLE onto bed; pt utilized [facility] elevated & bed rails. Utilized RW for mobility for increased safety. Pt requires cuing re: hand placement & CGA/min assist for transfers. Pt does ambulate in hallway but reports c/o dizziness so sat down & eventually returned to bed. VSS once back in bed. Encouraged pt to participate with therapy as much as possible to prevent further deconditioning & decreased tolerance to OOB mobility.     If plan is discharge home, recommend the following: A little help with bathing/dressing/bathroom;Assistance with cooking/housework;Assist for transportation;Supervision due to cognitive status   Can travel by private vehicle        Equipment Recommendations  Rolling walker (2 wheels);BSC/3in1    Recommendations for Other Services       Precautions / Restrictions Precautions Precautions: Fall Precaution/Restrictions Comments: PICC line, J and G  tube Restrictions Weight Bearing Restrictions Per Provider Order: No     Mobility  Bed Mobility Overal bed mobility: Needs Assistance Bed Mobility: Supine to Sit     Supine to sit: Supervision, HOB elevated, Used rails (exit R side of bed) Sit to supine: Min assist, HOB elevated (assistance to lift RLE into bed)        Transfers Overall transfer  level: Needs assistance Equipment used: Rolling walker (2 wheels) Transfers: Sit to/from Stand Sit to Stand: Contact guard assist, Min assist           General transfer comment: sit>stand from EOB, chair with cuing re: hand placement    Ambulation/Gait Ambulation/Gait assistance: Contact guard assist Gait Distance (Feet): 60 Feet Assistive device: Rolling walker (2 wheels) Gait Pattern/deviations: Decreased step length - right, Decreased step length - left, Decreased stride length Gait velocity: decreased     General Gait Details: Cuing re: positioning within [employer]             Wheelchair Mobility     Tilt Bed    Modified Rankin (Stroke Patients Only)       Balance Overall balance assessment: Needs assistance Sitting-balance support: No upper extremity supported, Feet supported Sitting balance-Leahy Scale: Fair     Standing balance support: Bilateral upper extremity supported, During functional activity, Reliant on assistive device for balance Standing balance-Leahy Scale: Fair                              Communication    Cognition Arousal: Alert Behavior During Therapy: Flat affect   PT - Cognitive impairments: Safety/Judgement                         Following commands: Intact      Cueing Cueing Techniques: Verbal cues  Exercises      General Comments General comments (skin integrity, edema, etc.): semi fowler in bed at end of session: BP 130/85 (  98), HR 80 bpm, SpO2 100% on room air.      Pertinent Vitals/Pain Pain Assessment Pain Assessment: 0-10 Pain Score: 5  Pain Location: L side of abdomen Pain Descriptors / Indicators: Discomfort Pain Intervention(s): Monitored during session    Home Living                          Prior Function            PT Goals (current goals can now be found in the care plan section) Acute Rehab PT Goals Patient Stated Goal: to get better PT Goal Formulation:  With patient Time For Goal Achievement: 01/17/24 Potential to Achieve Goals: Fair Progress towards PT goals: Progressing toward goals    Frequency    Min 2X/week      PT Plan      Co-evaluation              AM-PAC PT 6 Clicks Mobility   Outcome Measure  Help needed turning from your back to your side while in a flat bed without using bedrails?: None Help needed moving from lying on your back to sitting on the side of a flat bed without using bedrails?: A Little Help needed moving to and from a bed to a chair (including a wheelchair)?: A Little Help needed standing up from a chair using your arms (e.g., wheelchair or bedside chair)?: A Little Help needed to walk in hospital room?: A Little Help needed climbing 3-5 steps with a railing? : Total 6 Click Score: 17    End of Session   Activity Tolerance:  (limited by dizziness with mobility) Patient left: in bed;with call bell/phone within reach;with bed alarm set;with nursing/sitter in room Nurse Communication:  (pt c/o dizziness during session) PT Visit Diagnosis: Difficulty in walking, not elsewhere classified (R26.2);Muscle weakness (generalized) (M62.81)     Time: 8560-8540 PT Time Calculation (min) (ACUTE ONLY): 20 min  Charges:    $Therapeutic Activity: 8-22 mins PT General Charges $$ ACUTE PT VISIT: 1 Visit                     Richerd Pinal, PT, DPT 01/17/24, 3:07 PM  [name] 01/17/2024, 3:05 PM

## 2024-01-17 NOTE — Progress Notes (Addendum)
 Progress Note  Primary GI: Dr. Legrand DOA: 12/22/2023         Hospital Day: 28   Subjective  Chief Complaint: Persistent nausea, vomiting, abdominal pain   No family was present at the time of my evaluation. Patient was supposed to go for EGD this morning however her tube feedings were restarted last night so this will have to be rescheduled for tomorrow.  Currently at 1230. Patient continues to have leaking from J-tube which general surgery is involved.   She had celiac block yesterday but states this did not help her pain.  States she only has right upper epigastric discomfort while the tube feeds are running.  These have since been stopped and she states the pain has been improving.  Has not had any vomiting but continues to have nausea but states the medications do help with this. She states her stomach is very hungry rumbling for food. She continues to have diarrhea has had 4 small episodes of loose stools this morning no melena no hematochezia.  Patient states this is also worse when she is getting tube feedings and has not had another bowel movement since that time.    Objective   Vital signs in last 24 hours: Temp:  [98 F (36.7 C)-99 F (37.2 C)] 99 F (37.2 C) (07/29 0751) Pulse Rate:  [67-100] 100 (07/29 0751) Resp:  [14-23] 16 (07/29 0751) BP: (110-146)/(72-89) 136/87 (07/29 0751) SpO2:  [97 %-100 %] 98 % (07/29 0751) Last BM Date : 01/16/24 Last BM recorded by nurses in past 5 days Stool Type: Type 7 (Liquid consistency with no solid pieces) (01/17/2024  9:36 AM)  General:   Awake lying in bed no apparent distress dried lips Head:  Normocephalic and atraumatic. Eyes: sclerae anicteric,conjunctive pink  Heart:  regular rate and rhythm, no murmurs or gallops Pulm: Clear anteriorly; no wheezing Abdomen:  Soft, slightly distended, Obese AB, Sluggish bowel sounds. moderate tenderness around J-tube. Without guarding and Without rebound, bile leak from J-tube  site. Neurologic:  Alert and  oriented x4;  No focal deficits.  Skin:   Dry and intact without significant lesions or rashes. Psychiatric:  Cooperative. Normal mood and affect.   Intake/Output from previous day: 07/28 0701 - 07/29 0700 In: 1659.8 [I.V.:1462; NG/GT:197.8] Out: -  Intake/Output this shift: No intake/output data recorded.  Studies/Results: CT CELIAC PLEXUS BLOCK ANESTHETIC Result Date: 01/16/2024 CLINICAL DATA:  Severe abdominal pain, incompletely controlled. Concern of possible pancreatic lesion affecting the duodenum, biopsies negative. EXAM: CELIAC PLEXUS BLOCK  UNDER CT GUIDANCE ANESTHESIA/SEDATION: Intravenous Fentanyl  100mcg and Versed  2mg  were administered by RN during a total moderate (conscious) sedation time of 22 minutes; the patient's level of consciousness and physiological / cardiorespiratory status were monitored continuously by radiology RN under my direct supervision. TECHNIQUE: Select scans through the abdomen were obtained and an appropriate entry sites identified. Moderate sedation was initiated. Skin sites marked, prepped with chlorhexidine , and draped in usual sterile fashion, and infiltrated locally with 1% lidocaine . Under CT fluoroscopic guidance, a 22-gauge Chiba needle was advanced just superior to the origin of the celiac axis, to the left of midline. Diagnostic injection of 5 ml dilute Omnipaque -180 shows good spread in the region of the celiac plexus to the left of midline. In similar fashion, a 22 gauge Chiba needle was advanced to the level of the celiac plexus to the right of midline. Test injection of 5 mL dilute Omnipaque  180 shows good spread. Subsequently 10mL of Sensorcaine  0.5% were  then administered, divided evenly between the 2 sites, and the needle was removed. Followup scanning shows dilution and spread of the previous administered contrast bilaterally in the expected region of the celiac plexus. No evidence of hemorrhage or other apparent  complication. Patient tolerated the procedure well. IMPRESSION: 1. Technically successful celiac plexus block  under CT guidance. PLAN: If the patient has excellent pain response, and subsequent recurrence of pain, we could proceed with permanent celiac neurolysis. Electronically Signed   By: JONETTA Faes M.D.   On: 01/16/2024 16:28   CT ABDOMEN PELVIS W CONTRAST Result Date: 01/15/2024 CLINICAL DATA:  Abdominal pain with nausea.  J tube feeding. EXAM: CT ABDOMEN AND PELVIS WITH CONTRAST TECHNIQUE: Multidetector CT imaging of the abdomen and pelvis was performed using the standard protocol following bolus administration of intravenous contrast. RADIATION DOSE REDUCTION: This exam was performed according to the departmental dose-optimization program which includes automated exposure control, adjustment of the mA and/or kV according to patient size and/or use of iterative reconstruction technique. CONTRAST:  OMNIPAQUE  IOHEXOL  300 MG/ML  SOLN COMPARISON:  CT abdomen and pelvis 12/29/2023. FINDINGS: Lower chest: There is atelectasis in the lung bases. There are small bilateral pleural effusions, right greater than left. Hepatobiliary: No focal liver abnormality is seen. No gallstones, gallbladder wall thickening, or biliary dilatation. Pancreas: Fullness of the humeral head appears unchanged from prior. Pancreas otherwise appears within normal limits. Spleen: Normal in size without focal abnormality. Adrenals/Urinary Tract: Subcentimeter cysts are identified in the left kidney. There is mild right-sided hydronephrosis without obstructing calculus identified. There is no left-sided hydronephrosis. The adrenal glands and bladder are within normal limits. The bladder is distended. Stomach/Bowel: The appendix appears normal. There is no free intraperitoneal air. There is diffuse colonic diverticulosis. Percutaneous jejunostomy tube in place. There is some mildly dilated jejunal loop centrally without transition  point. Contrast is seen beyond dilated small bowel. Percutaneous gastrostomy tube in place. Stomach is nondilated. Vascular/Lymphatic: No significant vascular findings are present. No enlarged abdominal or pelvic lymph nodes. Reproductive: Status post hysterectomy. No adnexal masses. Other: There is a small amount of free fluid in the abdomen and pelvis. There is some air within the anterior right abdominal wall. There is diffuse body wall edema. Musculoskeletal: Sclerotic osseous metastatic disease and chronic compression fracture of L3 appear unchanged. IMPRESSION: 1. Percutaneous jejunostomy tube in place. There is some mildly dilated jejunal loop centrally without transition point. Findings may represent ileus or partial small bowel obstruction. 2. Small amount of free fluid in the abdomen and pelvis. 3. Small bilateral pleural effusions, right greater than left. 4. Diffuse body wall edema. 5. Mild right-sided hydronephrosis without obstructing calculus identified. 6. Distended bladder. 7. Colonic diverticulosis. 8. Sclerotic osseous metastatic disease. Electronically Signed   By: Greig Pique M.D.   On: 01/15/2024 19:45    Lab Results: Recent Labs    01/15/24 0306 01/16/24 0509 01/17/24 0157  WBC 4.5 6.6 8.0  HGB 7.1* 7.2* 7.4*  HCT 22.6* 23.2* 23.7*  PLT 176 182 187   BMET Recent Labs    01/15/24 0306 01/16/24 0509 01/17/24 0157  NA 137 136 136  K 4.1 3.8 4.1  CL 109 107 105  CO2 21* 22 22  GLUCOSE 140* 120* 140*  BUN 15 11 11   CREATININE 0.43* 0.41* 0.38*  CALCIUM  6.9* 7.2* 7.5*   LFT Recent Labs    01/16/24 0509  PROT 5.8*  ALBUMIN  2.3*  AST 26  ALT 23  ALKPHOS 703*  BILITOT  0.9   PT/INR No results for input(s): LABPROT, INR in the last 72 hours.   Scheduled Meds:  acetaminophen  (TYLENOL ) oral liquid 160 mg/5 mL  975 mg Per Tube Q6H   Chlorhexidine  Gluconate Cloth  6 each Topical Daily   dexamethasone  (DECADRON ) injection  4 mg Intravenous Q12H    erythromycin   400 mg Per Tube Q12H   feeding supplement (OSMOLITE 1.5 CAL)  1,000 mL Per J Tube Q24H   methocarbamol  (ROBAXIN ) injection  500 mg Intravenous Q8H   pantoprazole  (PROTONIX ) IV  40 mg Intravenous Q12H   sodium chloride  flush  10-40 mL Intracatheter Q12H   Vitamin D  (Ergocalciferol )  50,000 Units Oral Q7 days   Continuous Infusions:  sodium chloride  30 mL/hr at 01/16/24 1617   sodium chloride  Stopped (01/16/24 1356)   calcium  gluconate     magnesium  sulfate bolus IVPB     ondansetron  (ZOFRAN ) IV Stopped (01/13/24 2026)   sodium phosphate  15 mmol in sodium chloride  0.9 % 250 mL infusion 15 mmol (01/17/24 0914)   TPN ADULT (ION) 90 mL/hr at 01/16/24 1817   TPN ADULT (ION)        Patient profile:   Nausea, vomiting upper abdominal discomfort with acquired duodenal stricture,  masslike appearance on pancreas seen on abdominal MRI 7/4,  7/9 EUS without mass, biopsies nonspecific 7/9 EGD unable to transverse duodenal stenosis  07/14 s/p lap creation of gastrojejunostomy  7/14 with G-tube and J-tube downstream insertion 7/27 CT AP W no overt obstruction 01/16/2024 status post celiac block with IR   Impression/Plan:   Nausea, vomiting upper abdominal discomfort with acquired duodenal stricture,  masslike appearance on pancreas seen on abdominal MRI 7/4, 7/9 US  without mass, EGD unable to transverse duodenal stenosis, 7/14 status post lap gastric jejunostomy, 12/2020 J-tube insertion, status post celiac block 7/28 Elevated CA 19-9 469  - Patient on Compazine , Protonix , Valium , erythromycin  since 7/25, Decadron  and palliative care is involved - Consider trial of Reglan  - Patient supposed to go for EGD 7/29 but tube feeds were restarted now scheduled for 7/30.  Will need to be n.p.o. at midnight including clamping of tube feedings uncertain will be able to transverse duodenal stenosis, explained to the patient including risk and benefits.  Patient provides understanding and gave  verbal consent.  Diarrhea in setting of possible ileus on CT Possibly secondary to tube feedings Consider starting Creon Will repeat repeat KUB If continuing diarrhea even being off tube feedings with prolonged hospitalization consider C. difficile testing   Principal Problem:   AKI (acute kidney injury) (HCC) Active Problems:   Hypokalemia   HLD (hyperlipidemia)   Breast cancer metastasized to bone, right (HCC)   Constipation   Acute kidney injury superimposed on chronic kidney disease (HCC)   Dehydration   Prolonged QT interval   Hypocalcemia   Acute esophagitis   Candida esophagitis (HCC)   Transaminitis   Pancreatic mass   Right sided weakness   Hypotension   Abnormal CT of the abdomen   Elevated liver enzymes   Acute kidney injury (HCC)   Abnormal MRI of abdomen   Pancreatic lesion   Unintentional weight loss   Nausea and vomiting   Duodenal obstruction   Hypophosphatemia   Hypomagnesemia   Elevated CA 19-9 level    LOS: 25 days   Alan JONELLE Coombs  01/17/2024, 10:48 AM

## 2024-01-17 NOTE — Plan of Care (Signed)
  Problem: Education: Goal: Knowledge of General Education information will improve Description: Including pain rating scale, medication(s)/side effects and non-pharmacologic comfort measures Outcome: Progressing   Problem: Clinical Measurements: Goal: Diagnostic test results will improve Outcome: Progressing   Problem: Nutrition: Goal: Adequate nutrition will be maintained Outcome: Progressing   Problem: Coping: Goal: Level of anxiety will decrease Outcome: Progressing   Problem: Pain Managment: Goal: General experience of comfort will improve and/or be controlled Outcome: Progressing   Problem: Coping: Goal: Ability to adjust to condition or change in health will improve Outcome: Progressing

## 2024-01-17 NOTE — TOC Progression Note (Signed)
 Transition of Care [facility]) - Progression Note    Patient Details  Name: Chelsea Hansen MRN: [id number] Date of Birth: 11/30/1970  Transition of [facility]) CM/SW Contact  Tawni CHRISTELLA Eva, LCSW Phone Number: 01/17/2024, 8:46 AM  Clinical Narrative:     Pt is still on TPN. The pt inquired about the discharge plan. CSW informed the pt that when she is ready for discharge, all necessary home arrangements will be coordinated at that time. Care Management to follow.  Expected Discharge Plan: Home w Home Health Services Barriers to Discharge: Continued Medical Work up               Expected Discharge Plan and Services       Living arrangements for the past 2 months: Apartment                                       Social Drivers of Health (SDOH) Interventions SDOH Screenings   Food Insecurity: No Food Insecurity (12/22/2023)  Recent Concern: Food Insecurity - Food Insecurity Present (11/24/2023)  Housing: Low Risk  (12/22/2023)  Transportation Needs: No Transportation Needs (12/22/2023)  Utilities: Not At Risk (12/22/2023)  Depression (PHQ2-9): Low Risk  (12/22/2023)  Recent Concern: Depression (PHQ2-9) - High Risk (11/30/2023)  Financial Resource Strain: High Risk (01/14/2022)  Physical Activity: Insufficiently Active (08/01/2023)  Stress: Stress Concern Present (10/10/2023)  Tobacco Use: Low Risk  (01/10/2024)    Readmission Risk Interventions    12/07/2023   12:21 PM 11/26/2023    2:58 PM  Readmission Risk Prevention Plan  Transportation Screening Complete Complete  PCP or Specialist Appt within 3-5 Days  Complete  HRI or Home Care Consult  Complete  Social Work Consult for Recovery Care Planning/Counseling  Complete  Palliative Care Screening  Not Applicable  Medication Review Ocean[employer]) Complete Complete  PCP or Specialist appointment within 3-5 days of discharge Complete   HRI or Home Care Consult Complete   SW Recovery Care/Counseling Consult Complete    Palliative Care Screening Not Applicable   Skilled Nursing Facility Not Applicable

## 2024-01-17 NOTE — Discharge Summary (Signed)
 Physician Discharge Summary   Patient: Chelsea Hansen MRN: [id number] DOB: Mar 08, 1971  Admit date:     12/22/2023  Discharge date: 01/17/24  Discharge Physician: Garnette Pelt   PCP: [name], NP   Patient Left Against Medical Advice  Discharge Diagnoses: Principal Problem:   AKI (acute kidney injury) [facility]) Active Problems:   Hypokalemia   Acute kidney injury superimposed on chronic kidney disease (HCC)   Hypocalcemia   Breast cancer metastasized to bone, right (HCC)   HLD (hyperlipidemia)   Constipation   Dehydration   Prolonged QT interval   Acute esophagitis   Candida esophagitis (HCC)   Transaminitis   Pancreatic mass   Right sided weakness   Hypotension   Abnormal CT of the abdomen   Elevated liver enzymes   Acute kidney injury (HCC)   Abnormal MRI of abdomen   Pancreatic lesion   Unintentional weight loss   Nausea and vomiting   Duodenal obstruction   Hypophosphatemia   Hypomagnesemia   Elevated CA 19-9 level   Generalized abdominal pain  Resolved Problems:   * No resolved hospital problems. [facility] Course: 53 y.o. 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   Imaging showed a duodenal obstruction and masslike area of the pancreas.  GI and General Surgery consulted.     EUS attempted on 7/7, but unsuccessful secondary to duodenal stenosis.     Repeat EGD/EUS on 7/9 again unsuccessful in biopsy.  Gen surg consulted and NG tube was placed, patient started on TPN, patient subsequently underwent laparoscopic creation of gastrojejunostomy for duodenal stricture.  Patient started on clear liquids and diet advanced to full liquid diet. -Patient with ongoing nausea vomiting and followed by general surgery. -Patient to undergo gastrojejunostomy tube tube insertion for drainage of the stomach and J-tube downstream for tube feeds today,  01/10/2024.  Assessment and Plan: #1 pancreatic mass with duodenal stricture/SBO in the setting of pancreatic mass/??  Ileus -Patient noted to have a new mass noted on imaging of the pancreas noted on CT abdomen and pelvis. - Patient underwent MRCP which was concerning for expansive, masslike appearance of the pancreatic head and uncinate as noted on prior CT measuring 2.5 x 2.3 cm which was noted to be new and interval to prior restaging PET/CT dated 06/10/2023 and highly concerning for pancreatic adenocarcinoma. -CA 19-9 noted at 465. - Patient seen in consultation by GI however unfortunately EUS which was attempted on 7 7 was unsuccessful secondary to duodenal stenosis with repeat EGD/EUS done on 7 /9 noted to be unsuccessful and unable to get a biopsy. - General Surgery was consulted and patient underwent a gastrojejunal bypass surgery on 7/14 with no concerning nodules or evidence of metastatic disease on diagnostic laparoscopy. - Patient noted to have tolerated full liquid diet on 01/03/2024 however around 8 PM noted to had episode of emesis. -Patient noted with emesis the morning of 01/05/2024 and has been having emesis at least twice daily with last episode of emesis early on this morning, 01/09/2024.    -Per general surgery upper GI on 7/18 without complication.   - Patient still with ongoing nausea and emesis with gastrojejunostomy tube for drainage of stomach and downstream tube for feeds which was placed 7/22 -consideration for re-attempt at EGD EUS for further evaluation of pancreatic mass in 8 weeks - Discontinued fentanyl  PCA pump, Dilaudid , and oxycodone  given intolerance due to  BP and lethargy -Palliative Care consulted to assist with symptom management -given trial of Tigan  for nausea -Continued with nausea and abd pain improved with holding trickle  -Pt s/p celiac plexus nerve block with improvement in symptoms -GI re-consulted. Original plan for EGD 7/30. However on 7/30, patient  elected to leave AMA. General surgery recommends G tube decompression as needed if nauseated and continued tube feed via J tube. Discussed with patient, pt's son, daughter, and patient. Patient is alert and oriented, demonstrated insight to her own care, thus is able to make medical decisions. Pt aware that logistically, we are unable to arrange tube feed in this timeframe, yet still wants to leave. Also understands TPN will need to be discontinued. IV team consulted for PICC removal   2.  Hypertension - Held home antihypertensive medications.  - received 2 units prbcs on 7/22 for hgb 6.1, remained stable afterwards -bp stable   3.  Moderate protein calorie malnutrition - was on TPN.   - was receiving tube feed via J tube   4.  Candida esophagitis - Status post full course fluconazole .    5.  Chronic asthma -Stable.   6.  AKI on CKD stage 3a - Patient noted to have a creatinine of 3.2 on admission which improved with hydration   7.  Hypophosphatemia/hypomagnesemia/hypomagnesemia -Was being repleted per pharmacy as patient on TPN.   8.  Chronic anemia -Patient with no overt bleeding. - Hemoglobin at 6.1 from 8.5 from 7.4 after transfusion 1 unit PRBCs on 01/06/2024. -Hgb down to 6.1 on 7/22, s/p 2 more units PRBC's   9.  Dizziness/lightheadedness/soft blood pressure/hypotension -Patient noted to have an episode of nausea and abdominal pain after drinking soup in the afternoon on 01/07/2024.  - Patient given IV Dilaudid , IV Compazine  and noted to have transient unresponsiveness of less than 1 minute and subsequently improved.  Repeat vitals at that time noted with initial blood pressure of 110/66 with blood pressure dropping down into the high 80s to low 90s - IV Dilaudid  discontinued per above.        Patient Left Against medical Advice   Discharge Exam: Filed Weights   01/14/24 0500 01/15/24 0420 01/16/24 0635  Weight: 86.6 kg 88.6 kg 89.2 kg     Condition at discharge:  Left AMA  The results of significant diagnostics from this hospitalization (including imaging, microbiology, ancillary and laboratory) are listed below for reference.   Imaging Studies: DG Abd 1 View Result Date: 01/17/2024 CLINICAL DATA:  Abdominal pain. EXAM: ABDOMEN - 1 VIEW COMPARISON:  Abdominal radiograph dated 01/09/2024. FINDINGS: Percutaneous gastrostomy with balloon in the epigastric area to the left of the spine. No bowel dilatation. Air is noted in the colon. No acute osseous pathology. IMPRESSION: Nonobstructive bowel gas pattern. Electronically Signed   By: Vanetta Chou M.D.   On: 01/17/2024 15:24   CT CELIAC PLEXUS BLOCK ANESTHETIC Result Date: 01/16/2024 CLINICAL DATA:  Severe abdominal pain, incompletely controlled. Concern of possible pancreatic lesion affecting the duodenum, biopsies negative. EXAM: CELIAC PLEXUS BLOCK  UNDER CT GUIDANCE ANESTHESIA/SEDATION: Intravenous Fentanyl  100mcg and Versed  2mg  were administered by RN during a total moderate (conscious) sedation time of 22 minutes; the patient's level of consciousness and physiological / cardiorespiratory status were monitored continuously by radiology RN under my direct supervision. TECHNIQUE: Select scans through the abdomen were obtained and an appropriate entry sites identified. Moderate sedation was initiated. Skin sites marked, prepped with chlorhexidine , and draped in usual sterile fashion, and infiltrated locally  with 1% lidocaine . Under CT fluoroscopic guidance, a 22-gauge Chiba needle was advanced just superior to the origin of the celiac axis, to the left of midline. Diagnostic injection of 5 ml dilute Omnipaque -180 shows good spread in the region of the celiac plexus to the left of midline. In similar fashion, a 22 gauge Chiba needle was advanced to the level of the celiac plexus to the right of midline. Test injection of 5 mL dilute Omnipaque  180 shows good spread. Subsequently 10mL of Sensorcaine  0.5% were then  administered, divided evenly between the 2 sites, and the needle was removed. Followup scanning shows dilution and spread of the previous administered contrast bilaterally in the expected region of the celiac plexus. No evidence of hemorrhage or other apparent complication. Patient tolerated the procedure well. IMPRESSION: 1. Technically successful celiac plexus block  under CT guidance. PLAN: If the patient has excellent pain response, and subsequent recurrence of pain, we could proceed with permanent celiac neurolysis. Electronically Signed   By: JONETTA Faes M.D.   On: 01/16/2024 16:28   CT ABDOMEN PELVIS W CONTRAST Result Date: 01/15/2024 CLINICAL DATA:  Abdominal pain with nausea.  J tube feeding. EXAM: CT ABDOMEN AND PELVIS WITH CONTRAST TECHNIQUE: Multidetector CT imaging of the abdomen and pelvis was performed using the standard protocol following bolus administration of intravenous contrast. RADIATION DOSE REDUCTION: This exam was performed according to the departmental dose-optimization program which includes automated exposure control, adjustment of the mA and/or kV according to patient size and/or use of iterative reconstruction technique. CONTRAST:  OMNIPAQUE  IOHEXOL  300 MG/ML  SOLN COMPARISON:  CT abdomen and pelvis 12/29/2023. FINDINGS: Lower chest: There is atelectasis in the lung bases. There are small bilateral pleural effusions, right greater than left. Hepatobiliary: No focal liver abnormality is seen. No gallstones, gallbladder wall thickening, or biliary dilatation. Pancreas: Fullness of the humeral head appears unchanged from prior. Pancreas otherwise appears within normal limits. Spleen: Normal in size without focal abnormality. Adrenals/Urinary Tract: Subcentimeter cysts are identified in the left kidney. There is mild right-sided hydronephrosis without obstructing calculus identified. There is no left-sided hydronephrosis. The adrenal glands and bladder are within normal limits. The  bladder is distended. Stomach/Bowel: The appendix appears normal. There is no free intraperitoneal air. There is diffuse colonic diverticulosis. Percutaneous jejunostomy tube in place. There is some mildly dilated jejunal loop centrally without transition point. Contrast is seen beyond dilated small bowel. Percutaneous gastrostomy tube in place. Stomach is nondilated. Vascular/Lymphatic: No significant vascular findings are present. No enlarged abdominal or pelvic lymph nodes. Reproductive: Status post hysterectomy. No adnexal masses. Other: There is a small amount of free fluid in the abdomen and pelvis. There is some air within the anterior right abdominal wall. There is diffuse body wall edema. Musculoskeletal: Sclerotic osseous metastatic disease and chronic compression fracture of L3 appear unchanged. IMPRESSION: 1. Percutaneous jejunostomy tube in place. There is some mildly dilated jejunal loop centrally without transition point. Findings may represent ileus or partial small bowel obstruction. 2. Small amount of free fluid in the abdomen and pelvis. 3. Small bilateral pleural effusions, right greater than left. 4. Diffuse body wall edema. 5. Mild right-sided hydronephrosis without obstructing calculus identified. 6. Distended bladder. 7. Colonic diverticulosis. 8. Sclerotic osseous metastatic disease. Electronically Signed   By: Greig Pique M.D.   On: 01/15/2024 19:45   DG Abd Portable 1V Result Date: 01/09/2024 CLINICAL DATA:  Vomiting for the past 2 days. EXAM: PORTABLE ABDOMEN - 1 VIEW COMPARISON:  01/08/2024 FINDINGS: Normal  bowel-gas pattern. Mild-to-moderate lumbar spine degenerative changes. Postsurgical changes in the left mid and upper abdomen. IMPRESSION: No acute abnormality. Electronically Signed   By: Elspeth Bathe M.D.   On: 01/09/2024 10:18   DG Abd Portable 1V Result Date: 01/08/2024 CLINICAL DATA:  Ileus EXAM: PORTABLE ABDOMEN - 1 VIEW COMPARISON:  Upper GI 01/06/2024.  CT 12/29/2023  FINDINGS: Gas is seen in nondilated loops of large bowel. Scattered stool. Air towards the rectum. Minimal small bowel gas. Nonspecific. No obstruction. No obvious free air on this portable supine radiograph. Of note the diaphragm is clipped off the edge of the film. Degenerative changes along the spine. IMPRESSION: Nonspecific bowel gas pattern.  Overall minimal small bowel gas. Electronically Signed   By: Ranell Bring M.D.   On: 01/08/2024 10:27   DG UGI W SINGLE CM (SOL OR THIN BA) Result Date: 01/06/2024 CLINICAL DATA:  53 year old female with a history of metastatic breast cancer who presented to the ED with several days of nausea and vomiting. Imaging revealed duodenal obstruction from mass-like area of the pancreas. Patient is currently 4 days s/p laparoscopic creation of gastrojejunostomy with continued nausea and vomiting. Request for UGI. EXAM: DG UGI W SINGLE CM TECHNIQUE: Single contrast examination was then performed using thin liquid barium. This exam was performed by [facility] PA-C, and was supervised and interpreted by Dr. Newell Eke. FLUOROSCOPY: Radiation Exposure Index (as provided by the fluoroscopic device): 33.9 mGy Kerma COMPARISON:  CT A/P W CONTRAST - 12/29/23. FINDINGS: Esophagus:  Grossly normal. Esophageal motility:  Within normal limits. Gastroesophageal reflux:  None visualized. Stomach: Gastrojejunostomy without leak. Overall poor opacification of the stomach with further assessment limited by limited contrast ingestion. Gastric emptying: Not assessed. Duodenum:  Surgically absent.  Gastrojejunostomy. Other:  None. IMPRESSION: Expected postoperative changes after gastrojejunostomy. No evidence of contrast leak or extravasation. Electronically Signed   By: Newell Eke M.D.   On: 01/06/2024 13:09   DG CHEST PORT 1 VIEW Result Date: 01/03/2024 EXAM: 1 VIEW XRAY OF THE CHEST 01/03/2024 09:48:00 PM COMPARISON: Radiographs Dec 10 2023 CLINICAL HISTORY: Encounter for  central line care. PICC line position. FINDINGS: LUNGS AND PLEURA: The lungs are clear. No pneumothorax. HEART AND MEDIASTINUM: Stable cardiomediastinal silhouette. BONES AND SOFT TISSUES: No acute osseous abnormality. LINES AND TUBES: Right PICC tip in the low SVC. IMPRESSION: 1. Right PICC tip in the low SVC. 2. Clear lungs without pneumothorax. Electronically signed by: Norman Gatlin MD 01/03/2024 09:57 PM EDT RP Workstation: [id number]   DG Abd Portable 1V Result Date: 12/30/2023 CLINICAL DATA:  NG tube placement EXAM: PORTABLE ABDOMEN - 1 VIEW COMPARISON:  CT abdomen pelvis 12/29/2023 FINDINGS: Enteric tube tip and side-port in the stomach. Gaseous distention of loops of bowel in the left upper abdomen similar to prior CT. IMPRESSION: Enteric tube tip and side-port in the stomach. Electronically Signed   By: Norman Gatlin M.D.   On: 12/30/2023 17:38   CT ABDOMEN PELVIS W CONTRAST Result Date: 12/29/2023 CLINICAL DATA:  Breast cancer. Epigastric pain. Pancreatic head lesions suspected on MRI, not well corroborated on endoscopy and endoscopic ultrasound. * Tracking Code: BO * EXAM: CT ABDOMEN AND PELVIS WITH CONTRAST TECHNIQUE: Multidetector CT imaging of the abdomen and pelvis was performed using the standard protocol following bolus administration of intravenous contrast. RADIATION DOSE REDUCTION: This exam was performed according to the departmental dose-optimization program which includes automated exposure control, adjustment of the mA and/or kV according to patient size and/or use of iterative reconstruction technique.  CONTRAST:  OMNIPAQUE  IOHEXOL  300 MG/ML  SOLN COMPARISON:  Numerous exams including MRI abdomen 12/23/2023; CT scans from 12/22/2023, 12/10/2023, 12/06/2023, and 11/23/2023; and multiple PET scans including 11/24/2023 FINDINGS: Lower chest: Small type 1 hiatal hernia. Hepatobiliary: 3 mm simple cyst in the right hepatic lobe on image 32 series 2, as shown on recent MRI.  Gallbladder unremarkable. CBD 5 mm in diameter, within normal limits. Pancreas: Equivocal fullness along the right side of the pancreatic head without abnormal hypoenhancement currently. Borderline prominent dorsal pancreatic duct in the pancreatic body. Indistinct margins of the pancreatic head with the adjacent duodenum, potentially from local inflammatory process, difficult to confidently exclude the possibility of an infiltrative lesion. Slightly kinked appearance at the junction of the descending and transverse duodenum at the site where further passing of the endoscope was not feasible. Stricturing of the duodenum in this vicinity is not excluded. Poor definition of the ampullary region. This area was not overtly hypermetabolic on the PET-CT from 1 month ago. Spleen: Unremarkable Adrenals/Urinary Tract: Tiny renal cysts or no further imaging workup. Adrenal glands normal. Stomach/Bowel: As noted above, there is potentially a duodenal stricture at the junction of the second and third portions with poor definition of fat planes between the pancreatic head in this segment of the duodenum Colonic diverticulosis.  No findings of active diverticulitis. Vascular/Lymphatic: Unremarkable.  No porta hepatis adenopathy. Reproductive: Uterus absent.  Adnexa unremarkable. Other: Small amount of free pelvic fluid. Musculoskeletal: Widespread sclerotic lesions in the skeleton compatible with osseous metastatic disease. Stable remote L3 compression fracture. IMPRESSION: 1. Equivocal fullness along the right side of the pancreatic head without abnormal hypoenhancement currently. Ill definition of borders between the pancreatic head and adjacent portion of the duodenum. I do not see a distinct mass and PET scan was negative in this region 1 month ago. As such, the current appearance could be inflammatory or postinflammatory in nature, correlate with lipase levels. Given the somewhat equivocal findings on MRI and endoscopic  ultrasound, surveillance imaging may be indicated. 2. Small amount of free pelvic fluid. 3. Widespread sclerotic lesions in the skeleton compatible with osseous metastatic disease. 4. Small type 1 hiatal hernia. 5. Colonic diverticulosis. Electronically Signed   By: Ryan Salvage M.D.   On: 12/29/2023 15:56   DG Abd Portable 1V Result Date: 12/28/2023 CLINICAL DATA:  Initial evaluation for acute abdominal pain. Vomiting. EXAM: PORTABLE ABDOMEN - 1 VIEW COMPARISON:  Radiograph from 12/24/2023. FINDINGS: Mild gaseous distension of the stomach. Bowel gas pattern is nonobstructive. No appreciable bowel wall thickening. No soft tissue mass or abnormal calcification. No visible free air. Mild lumbar levoscoliosis with multilevel degenerative spondylosis. Visualized osseous structures demonstrate no acute finding. IMPRESSION: 1. Mild gaseous distension of the stomach. 2. Otherwise negative abdominal radiographs with overall nonobstructive bowel gas pattern. Electronically Signed   By: Morene Hoard M.D.   On: 12/28/2023 19:58   US  EKG SITE RITE Result Date: 12/27/2023 If Site Rite image not attached, placement could not be confirmed due to current cardiac rhythm.  DG C-Arm 1-60 Min-No Report Result Date: 12/27/2023 Fluoroscopy was utilized by the requesting physician.  No radiographic interpretation.   DG Abd Portable 1V Result Date: 12/24/2023 CLINICAL DATA:  Intractable nausea, vomiting EXAM: PORTABLE ABDOMEN - 1 VIEW COMPARISON:  12/12/2023.  CT 12/22/2023 FINDINGS: The bowel gas pattern is normal. No radio-opaque calculi or other significant radiographic abnormality are seen. IMPRESSION: Negative. Electronically Signed   By: Franky Crease M.D.   On: 12/24/2023 19:26  MR ABDOMEN MRCP W WO CONTAST Result Date: 12/23/2023 CLINICAL DATA:  Pancreatitis suspected, elevated LFTs, query pancreatic mass, history of metastatic breast cancer EXAM: MRI ABDOMEN WITHOUT AND WITH CONTRAST (INCLUDING MRCP)  TECHNIQUE: Multiplanar multisequence MR imaging of the abdomen was performed both before and after the administration of intravenous contrast. Heavily T2-weighted images of the biliary and pancreatic ducts were obtained, and three-dimensional MRCP images were rendered by post processing. CONTRAST:  7mL GADAVIST  GADOBUTROL  1 MMOL/ML IV SOLN COMPARISON:  CT chest abdomen pelvis, 12/22/2023, PET-CT, 11/24/2023, 06/10/2023 FINDINGS: Lower chest: No acute abnormality. Hepatobiliary: No solid liver abnormality is seen. No gallstones or gallbladder wall thickening. Common bile duct at the upper limit of normal in caliber measuring up to 0.7 cm (series 3, image 16). Pancreas: Subtly expansile, masslike appearance of the pancreatic head and uncinate as seen by prior CT, measuring 2.5 x 2.3 cm (series 4, image 20). There is however minimal if any associated hypoenhancement and no underlying diffusion restriction. The central pancreatic duct and central common bile duct are effaced at this level just above the ampulla. Notably, this appearance has significantly changed in the interval to prior restaging PET-CT examination dated 06/10/2023. Prominence of the pancreatic duct measuring up to 0.4 cm. Spleen: Normal in size without significant abnormality. Adrenals/Urinary Tract: Adrenal glands are unremarkable. Multiple simple renal cortical cysts, benign, requiring no further follow-up or characterization kidneys are otherwise normal, without obvious renal calculi, solid lesion, or hydronephrosis. Stomach/Bowel: Stomach is within normal limits. No evidence of bowel wall thickening, distention, or inflammatory changes. Pancolonic diverticulosis. Vascular/Lymphatic: No significant vascular findings are present. No enlarged abdominal lymph nodes. Other: No abdominal wall hernia or abnormality. No ascites. Musculoskeletal: No acute osseous findings. Heterogeneously enhancing vertebral metastases (series 20, image 20). IMPRESSION: 1.  Subtly expansile, masslike appearance of the pancreatic head and uncinate as seen by prior CT, measuring 2.5 x 2.3 cm. 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 2. Common bile duct at the upper limit of normal in caliber measuring up to 0.7 cm. No gallstones. 3. Prominence of the pancreatic duct measuring up to 0.4 cm. 4. Heterogeneously enhancing vertebral metastases, in keeping with known metastatic breast cancer. 5. Pancolonic diverticulosis. Electronically Signed   By: Marolyn JONETTA Jaksch M.D.   On: 12/23/2023 20:20   MR 3D Recon At Scanner Result Date: 12/23/2023 CLINICAL DATA:  Pancreatitis suspected, elevated LFTs, query pancreatic mass, history of metastatic breast cancer EXAM: MRI ABDOMEN WITHOUT AND WITH CONTRAST (INCLUDING MRCP) TECHNIQUE: Multiplanar multisequence MR imaging of the abdomen was performed both before and after the administration of intravenous contrast. Heavily T2-weighted images of the biliary and pancreatic ducts were obtained, and three-dimensional MRCP images were rendered by post processing. CONTRAST:  7mL GADAVIST  GADOBUTROL  1 MMOL/ML IV SOLN COMPARISON:  CT chest abdomen pelvis, 12/22/2023, PET-CT, 11/24/2023, 06/10/2023 FINDINGS: Lower chest: No acute abnormality. Hepatobiliary: No solid liver abnormality is seen. No gallstones or gallbladder wall thickening. Common bile duct at the upper limit of normal in caliber measuring up to 0.7 cm (series 3, image 16). Pancreas: Subtly expansile, masslike appearance of the pancreatic head and uncinate as seen by prior CT, measuring 2.5 x 2.3 cm (series 4, image 20). There is however minimal if any associated hypoenhancement and no underlying diffusion restriction. The central pancreatic duct and central common bile duct are effaced at this level just above the ampulla. Notably, this appearance has significantly changed in the interval  to prior  restaging PET-CT examination dated 06/10/2023. Prominence of the pancreatic duct measuring up to 0.4 cm. Spleen: Normal in size without significant abnormality. Adrenals/Urinary Tract: Adrenal glands are unremarkable. Multiple simple renal cortical cysts, benign, requiring no further follow-up or characterization kidneys are otherwise normal, without obvious renal calculi, solid lesion, or hydronephrosis. Stomach/Bowel: Stomach is within normal limits. No evidence of bowel wall thickening, distention, or inflammatory changes. Pancolonic diverticulosis. Vascular/Lymphatic: No significant vascular findings are present. No enlarged abdominal lymph nodes. Other: No abdominal wall hernia or abnormality. No ascites. Musculoskeletal: No acute osseous findings. Heterogeneously enhancing vertebral metastases (series 20, image 20). IMPRESSION: 1. Subtly expansile, masslike appearance of the pancreatic head and uncinate as seen by prior CT, measuring 2.5 x 2.3 cm. 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 2. Common bile duct at the upper limit of normal in caliber measuring up to 0.7 cm. No gallstones. 3. Prominence of the pancreatic duct measuring up to 0.4 cm. 4. Heterogeneously enhancing vertebral metastases, in keeping with known metastatic breast cancer. 5. Pancolonic diverticulosis. Electronically Signed   By: Marolyn JONETTA Jaksch M.D.   On: 12/23/2023 20:20   US  Abdomen Limited RUQ (LIVER/GB) Result Date: 12/23/2023 CLINICAL DATA:  Elevated LFTs EXAM: ULTRASOUND ABDOMEN LIMITED RIGHT UPPER QUADRANT COMPARISON:  None Available. FINDINGS: Gallbladder: Patient experienced some pain during the exam. The sonographer did not no whether this constituted a sonographic Murphy's sign. The sonographer is unable to be reached at this time. Borderline wall thickening (3 mm). No cholelithiasis. No pericholecystic fluid. Common bile duct:  Diameter: 5 mm Liver: No focal lesion identified. Within normal limits in parenchymal echogenicity. Portal vein is patent on color Doppler imaging with normal direction of blood flow towards the liver. Other: None. IMPRESSION: 1. Borderline gallbladder wall thickening. No cholelithiasis or pericholecystic fluid. Patient had pain during the exam but it is unclear if this constitutes a sonographic Murphy's sign. If there is concern for acute cholecystitis consider HIDA scan for further evaluation. 2. No biliary dilatation. Electronically Signed   By: Norman Gatlin M.D.   On: 12/23/2023 00:32   CT CHEST ABDOMEN PELVIS WO CONTRAST Result Date: 12/22/2023 CLINICAL DATA:  Bowel obstruction suspected nausea, vomiting, abdominal pain, and per cancer center hypotension. Pt has not been able to keep food or drink down. Stated she did an enema on Monday and only had a small amount of output. Also stated she has lost almost 20lbs EXAM: CT CHEST, ABDOMEN AND PELVIS WITHOUT CONTRAST TECHNIQUE: Multidetector CT imaging of the chest, abdomen and pelvis was performed following the standard protocol without IV contrast. RADIATION DOSE REDUCTION: This exam was performed according to the departmental dose-optimization program which includes automated exposure control, adjustment of the mA and/or kV according to patient size and/or use of iterative reconstruction technique. COMPARISON:  CT chest 12/12/2023, CT abdomen pelvis 12/10/2023, PET CT 11/23/2023, CT abdomen pelvis 11/17/2022 FINDINGS: CT CHEST FINDINGS Cardiovascular: Normal heart size. No significant pericardial effusion. The thoracic aorta is normal in caliber. No atherosclerotic plaque of the thoracic aorta. No coronary artery calcifications. Mediastinum/Nodes: No gross hilar adenopathy, noting limited sensitivity for the detection of hilar adenopathy on this noncontrast study. No enlarged mediastinal or axillary lymph nodes. Thyroid  gland, trachea, and esophagus  demonstrate no significant findings. Lungs/Pleura: Similar-appearing bilateral upper lung zone, right greater than left, centrilobular pulmonary micronodules. No pulmonary mass. No pleural effusion. No pneumothorax. Musculoskeletal: Postsurgical changes of the bilateral breasts.  No suspicious lytic or blastic osseous lesions. No acute displaced fracture. Multilevel degenerative changes of the spine. CT ABDOMEN PELVIS FINDINGS Hepatobiliary: No focal liver abnormality. No gallstones, gallbladder wall thickening, or pericholecystic fluid. No biliary dilatation. Pancreas: Persistent vague fat stranding along the proximal pancreas with query underlying vague mass (2:68). No surrounding inflammatory changes. No main pancreatic ductal dilatation. Spleen: Normal in size without focal abnormality. Adrenals/Urinary Tract: No adrenal nodule bilaterally. No nephrolithiasis and no hydronephrosis. No definite contour-deforming renal mass. No ureterolithiasis or hydroureter. The urinary bladder is unremarkable. Stomach/Bowel: Stomach is within normal limits. No evidence of bowel wall thickening or dilatation. Appendix appears normal. Vascular/Lymphatic: No abdominal aorta or iliac aneurysm. No abdominal, pelvic, or inguinal lymphadenopathy. Reproductive: Status post hysterectomy. No adnexal masses. Other: No intraperitoneal free fluid. No intraperitoneal free gas. No organized fluid collection. Musculoskeletal: No abdominal wall hernia or abnormality. Persistent scattered axial and appendicular sclerotic osseous metastatic lesions. L3 vertebral body hemangioma with stable chronic vertebral body height loss. No acute displaced fracture. Multilevel degenerative changes of the spine. IMPRESSION: 1. Limited evaluation on this noncontrast study. 2. Similar-appearing bilateral upper lung zone, right greater than left, centrilobular pulmonary micronodules. Finding may represent infection/inflammation. Underlying malignancy not excluded.  3. Persistent vague fat stranding along the proximal pancreas with query underlying vague mass. Correlate with lipase levels. If clinically indicated, consider MRI pancreatic protocol for further evaluation. 4. Persistent scattered axial and appendicular sclerotic osseous metastatic lesions. 5. Colonic diverticulosis with no acute diverticulitis. Electronically Signed   By: Morgane  Naveau M.D.   On: 12/22/2023 17:55    Microbiology: Results for orders placed or performed during the hospital encounter of 12/10/23  Blood Culture (routine x 2)     Status: None   Collection Time: 12/10/23  8:22 PM   Specimen: BLOOD RIGHT HAND  Result Value Ref Range Status   Specimen Description BLOOD RIGHT HAND  Final   Special Requests   Final    BOTTLES DRAWN AEROBIC AND ANAEROBIC Blood Culture adequate volume   Culture   Final    NO GROWTH 5 DAYS Performed at [facility], [address] [place], [place] [address]    Report Status 12/15/2023 FINAL  Final  Resp panel by RT-PCR (RSV, Flu A&B, Covid) Anterior Nasal Swab     Status: None   Collection Time: 12/10/23  8:48 PM   Specimen: Anterior Nasal Swab  Result Value Ref Range Status   SARS Coronavirus 2 by RT PCR NEGATIVE NEGATIVE Final   Influenza A by PCR NEGATIVE NEGATIVE Final   Influenza B by PCR NEGATIVE NEGATIVE Final    Comment: (NOTE) The Xpert Xpress SARS-CoV-2/FLU/RSV plus assay is intended as an aid in the diagnosis of influenza from Nasopharyngeal swab specimens and should not be used as a sole basis for treatment. Nasal washings and aspirates are unacceptable for Xpert Xpress SARS-CoV-2/FLU/RSV testing.  Fact Sheet for Patients: BloggerCourse.com  Fact Sheet for Healthcare Providers: SeriousBroker.it  This test is not yet approved or cleared by the [place]  FDA and has been authorized for detection and/or diagnosis of SARS-CoV-2 by FDA under an Emergency Use  Authorization (EUA). This EUA will remain in effect (meaning this test can be used) for the duration of the COVID-19 declaration under Section 564(b)(1) of the Act, 21 U.S.C. section 360bbb-3(b)(1), unless the authorization is terminated or revoked.     Resp Syncytial Virus by PCR NEGATIVE NEGATIVE Final    Comment: (NOTE) Fact Sheet for Patients: BloggerCourse.com  Fact  Sheet for Healthcare Providers: SeriousBroker.it  This test is not yet approved or cleared by the [place]  FDA and has been authorized for detection and/or diagnosis of SARS-CoV-2 by FDA under an Emergency Use Authorization (EUA). This EUA will remain in effect (meaning this test can be used) for the duration of the COVID-19 declaration under Section 564(b)(1) of the Act, 21 U.S.C. section 360bbb-3(b)(1), unless the authorization is terminated or revoked.  Performed at [facility], [address] [place], [place] [address]   Blood Culture (routine x 2)     Status: None   Collection Time: 12/10/23  8:48 PM   Specimen: BLOOD  Result Value Ref Range Status   Specimen Description BLOOD RIGHT ANTECUBITAL  Final   Special Requests   Final    BOTTLES DRAWN AEROBIC AND ANAEROBIC Blood Culture adequate volume   Culture   Final    NO GROWTH 5 DAYS Performed at [facility], [address] [place], [place] [address]    Report Status 12/15/2023 FINAL  Final   *Note: Due to a large number of results and/or encounters for the requested time period, some results have not been displayed. A complete set of results can be found in Results Review.    Labs: CBC: Recent Labs  Lab 01/11/24 0210 01/12/24 0330 01/13/24 0437 01/15/24 0306 01/16/24 0509 01/17/24 0157  WBC 5.0 4.2 4.2 4.5 6.6 8.0  NEUTROABS 3.3  --   --   --   --   --   HGB 8.2* 7.9* 7.9* 7.1* 7.2* 7.4*  HCT 25.9* 25.5* 25.0* 22.6* 23.2* 23.7*  MCV 76.2* 76.1* 76.2* 75.8* 75.1* 75.2*   PLT 179 178 177 176 182 187   Basic Metabolic Panel: Recent Labs  Lab 01/13/24 0437 01/14/24 0118 01/15/24 0306 01/16/24 0509 01/17/24 0157  NA 137 138 137 136 136  K 3.8 4.1 4.1 3.8 4.1  CL 110 111 109 107 105  CO2 20* 21* 21* 22 22  GLUCOSE 113* 131* 140* 120* 140*  BUN 16 15 15 11 11   CREATININE 0.38* 0.50 0.43* 0.41* 0.38*  CALCIUM  6.9* 7.0* 6.9* 7.2* 7.5*  MG 2.5* 2.5* 2.3 1.9 1.8  PHOS 2.8 2.5 2.0* 2.5 2.3*   Liver Function Tests: Recent Labs  Lab 01/11/24 0210 01/12/24 0330 01/13/24 0437 01/16/24 0509  AST  --  27 25 26   ALT  --  25 22 23   ALKPHOS  --  841* 772* 703*  BILITOT  --  0.7 0.6 0.9  PROT  --  6.1* 6.1* 5.8*  ALBUMIN  2.9* 2.7* 2.6* 2.3*   CBG: Recent Labs  Lab 01/17/24 0034 01/17/24 0418 01/17/24 0749 01/17/24 1138 01/17/24 1534  GLUCAP 131* 139* 124* 145* 141*   Signed: Garnette Pelt, MD [facility] 01/17/2024

## 2024-01-17 NOTE — Progress Notes (Signed)
 Chelsea Hansen   DOB:1971-04-19   FM#:[id number]   RDW#:[id number]  Subjective: 53 year old woman with metastatic breast cancer to the bone, duodenal stenosis, treated esophageal candidiasis, HTN, who presented with abdominal pain and emesis secondary to duodenal stenosis. (Previous hospitalizations around this).    Current Hospitalization Highlights MRCP 7/4: 2.5 x 2.3cm mass on pancreatic head concerning for pancreatic adenocarcinoma, CA 19-9 465 EUS attempt unsuccessful on 7/7 and 7/9 Gen surg consult for TPN, NG tube to prep for abd surgery 7/14: laparoscopic gastrojejunostomy to bypass stricture 7/22: due to continued vomiting laparoscopic insertion of gastrostomy and jejunostomy tube  7/28: celiac plexus block  I went by Chelsea Hansen's room today and she is very upset.  She would like to leave AGAINST MEDICAL ADVICE.  She does not feel like she is getting better.  She is awake and alert.  She heard from family who said that they went to a different facility with similar issues and were able to feel better within 2 weeks.   Vitals:   01/17/24 1317 01/17/24 1459  BP: 139/88 130/85  Pulse: 82 81  Resp: 16 19  Temp: 98.4 F (36.9 C) 98.7 F (37.1 C)  SpO2: 96% 100%    Body mass index is 27.43 kg/m.  Intake/Output Summary (Last 24 hours) at 01/17/2024 1632 Last data filed at 01/17/2024 1409 Gross per 24 hour  Intake 1659.78 ml  Output 1000 ml  Net 659.78 ml   Patient is tearful, crying in bed.  She is awake and alert and oriented x 4.  CBG (last 3)  Recent Labs    01/17/24 0749 01/17/24 1138 01/17/24 1534  GLUCAP 124* 145* 141*     Labs:  Lab Results  Component Value Date   WBC 8.0 01/17/2024   HGB 7.4 (L) 01/17/2024   HCT 23.7 (L) 01/17/2024   MCV 75.2 (L) 01/17/2024   PLT 187 01/17/2024   NEUTROABS 3.3 01/11/2024     Basic Metabolic Panel: Recent Labs  Lab 01/13/24 0437 01/14/24 0118 01/15/24 0306 01/16/24 0509 01/17/24 0157  NA 137 138 137 136 136  K 3.8 4.1  4.1 3.8 4.1  CL 110 111 109 107 105  CO2 20* 21* 21* 22 22  GLUCOSE 113* 131* 140* 120* 140*  BUN 16 15 15 11 11   CREATININE 0.38* 0.50 0.43* 0.41* 0.38*  CALCIUM  6.9* 7.0* 6.9* 7.2* 7.5*  MG 2.5* 2.5* 2.3 1.9 1.8  PHOS 2.8 2.5 2.0* 2.5 2.3*   GFR Estimated Creatinine Clearance: 102.7 mL/min (A) (by C-G formula based on SCr of 0.38 mg/dL (L)). Liver Function Tests: Recent Labs  Lab 01/11/24 0210 01/12/24 0330 01/13/24 0437 01/16/24 0509  AST  --  27 25 26   ALT  --  25 22 23   ALKPHOS  --  841* 772* 703*  BILITOT  --  0.7 0.6 0.9  PROT  --  6.1* 6.1* 5.8*  ALBUMIN  2.9* 2.7* 2.6* 2.3*      Studies:  DG Abd 1 View Result Date: 01/17/2024 CLINICAL DATA:  Abdominal pain. EXAM: ABDOMEN - 1 VIEW COMPARISON:  Abdominal radiograph dated 01/09/2024. FINDINGS: Percutaneous gastrostomy with balloon in the epigastric area to the left of the spine. No bowel dilatation. Air is noted in the colon. No acute osseous pathology. IMPRESSION: Nonobstructive bowel gas pattern. Electronically Signed   By: Vanetta Chou M.D.   On: 01/17/2024 15:24   CT CELIAC PLEXUS BLOCK ANESTHETIC Result Date: 01/16/2024 CLINICAL DATA:  Severe abdominal pain, incompletely controlled. Concern  of possible pancreatic lesion affecting the duodenum, biopsies negative. EXAM: CELIAC PLEXUS BLOCK  UNDER CT GUIDANCE ANESTHESIA/SEDATION: Intravenous Fentanyl  100mcg and Versed  2mg  were administered by RN during a total moderate (conscious) sedation time of 22 minutes; the patient's level of consciousness and physiological / cardiorespiratory status were monitored continuously by radiology RN under my direct supervision. TECHNIQUE: Select scans through the abdomen were obtained and an appropriate entry sites identified. Moderate sedation was initiated. Skin sites marked, prepped with chlorhexidine , and draped in usual sterile fashion, and infiltrated locally with 1% lidocaine . Under CT fluoroscopic guidance, a 22-gauge Chiba needle  was advanced just superior to the origin of the celiac axis, to the left of midline. Diagnostic injection of 5 ml dilute Omnipaque -180 shows good spread in the region of the celiac plexus to the left of midline. In similar fashion, a 22 gauge Chiba needle was advanced to the level of the celiac plexus to the right of midline. Test injection of 5 mL dilute Omnipaque  180 shows good spread. Subsequently 10mL of Sensorcaine  0.5% were then administered, divided evenly between the 2 sites, and the needle was removed. Followup scanning shows dilution and spread of the previous administered contrast bilaterally in the expected region of the celiac plexus. No evidence of hemorrhage or other apparent complication. Patient tolerated the procedure well. IMPRESSION: 1. Technically successful celiac plexus block  under CT guidance. PLAN: If the patient has excellent pain response, and subsequent recurrence of pain, we could proceed with permanent celiac neurolysis. Electronically Signed   By: JONETTA Faes M.D.   On: 01/16/2024 16:28   CT ABDOMEN PELVIS W CONTRAST Result Date: 01/15/2024 CLINICAL DATA:  Abdominal pain with nausea.  J tube feeding. EXAM: CT ABDOMEN AND PELVIS WITH CONTRAST TECHNIQUE: Multidetector CT imaging of the abdomen and pelvis was performed using the standard protocol following bolus administration of intravenous contrast. RADIATION DOSE REDUCTION: This exam was performed according to the departmental dose-optimization program which includes automated exposure control, adjustment of the mA and/or kV according to patient size and/or use of iterative reconstruction technique. CONTRAST:  OMNIPAQUE  IOHEXOL  300 MG/ML  SOLN COMPARISON:  CT abdomen and pelvis 12/29/2023. FINDINGS: Lower chest: There is atelectasis in the lung bases. There are small bilateral pleural effusions, right greater than left. Hepatobiliary: No focal liver abnormality is seen. No gallstones, gallbladder wall thickening, or biliary  dilatation. Pancreas: Fullness of the humeral head appears unchanged from prior. Pancreas otherwise appears within normal limits. Spleen: Normal in size without focal abnormality. Adrenals/Urinary Tract: Subcentimeter cysts are identified in the left kidney. There is mild right-sided hydronephrosis without obstructing calculus identified. There is no left-sided hydronephrosis. The adrenal glands and bladder are within normal limits. The bladder is distended. Stomach/Bowel: The appendix appears normal. There is no free intraperitoneal air. There is diffuse colonic diverticulosis. Percutaneous jejunostomy tube in place. There is some mildly dilated jejunal loop centrally without transition point. Contrast is seen beyond dilated small bowel. Percutaneous gastrostomy tube in place. Stomach is nondilated. Vascular/Lymphatic: No significant vascular findings are present. No enlarged abdominal or pelvic lymph nodes. Reproductive: Status post hysterectomy. No adnexal masses. Other: There is a small amount of free fluid in the abdomen and pelvis. There is some air within the anterior right abdominal wall. There is diffuse body wall edema. Musculoskeletal: Sclerotic osseous metastatic disease and chronic compression fracture of L3 appear unchanged. IMPRESSION: 1. Percutaneous jejunostomy tube in place. There is some mildly dilated jejunal loop centrally without transition point. Findings may represent ileus or  partial small bowel obstruction. 2. Small amount of free fluid in the abdomen and pelvis. 3. Small bilateral pleural effusions, right greater than left. 4. Diffuse body wall edema. 5. Mild right-sided hydronephrosis without obstructing calculus identified. 6. Distended bladder. 7. Colonic diverticulosis. 8. Sclerotic osseous metastatic disease. Electronically Signed   By: Greig Pique M.D.   On: 01/15/2024 19:45    Assessment/Plan: 53 y.o. 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  She has not required any nausea vomiting medications today and she is required less pain meds which is an indication that the celiac plexus block was successful.  She is ready to leave and get some answers at a different facility around the etiology of her duodenal stenosis.  I recommended that she consider undergoing EUS tomorrow with Dr. Wilhelmenia and if that is unsuccessful then having a hospital to hospital transfer to [facility] or [facility].  I reviewed my concern for her safety in leaving and having to discontinue the lines and that I was worried she might get more sick if she left.  Chelsea Hansen verbalized understanding of the risks of leaving AGAINST MEDICAL ADVICE and tells me that she plans to go home tonight and will return tomorrow to [facility] or [facility].  Secure chat sent to her treatment team including GI, surgery, Dr. Odean (oncologist), Dr. Felizardo hospitalist, her bedside nurse, and social work.  I asked that Chelsea Hansen call me in the morning to give me an update on how she is doing and where she is going.   Chelsea JAYSON Kendall, NP 01/17/2024  4:32 PM Medical Oncology and Hematology [facility] [address] [place], [place] [address] Tel. [phone]    Fax. [phone]

## 2024-01-17 NOTE — Progress Notes (Signed)
 PHARMACY - TOTAL PARENTERAL NUTRITION CONSULT NOTE   Indication: Pancreatic mass with duodenal stricture/obstruction  Patient Measurements: Height: 5' 11 (180.3 cm) Weight: 89.2 kg (196 lb 10.4 oz) IBW/kg (Calculated) : 70.8 TPN AdjBW (KG): 81.9 Body mass index is 27.43 kg/m. Usual Weight: 92.5 kg in April of this year  Assessment:  Pharmacy consulted to manage TPN for  53 yo needing nutritional support while awaiting surgical intervention for pancreatic mass with duodenal stricture/obstruction in the setting of metastatic breast cancer resulting in poor oral intake, persistent nausea and vomiting, and weight loss.  S/p venting Gtube placement on 7/22 with plans for Jtube feedings.    Glucose / Insulin :  No Hx DM. BG remains < 150.  - SSI/CBGs  stopped on 7/19 - Dexamethasone  4mg  IV q12h (7/26 >> ) Electrolytes: Elytes WNL, except CorrCa 8.86 (got IV Ca gluconate 7/25, 7/26, 7/27, Phos 2.3; Mag 1.8   Renal: SCr and BUN OK Hepatic: albumin  remains low; alk phos remains elevated 703 (7/28); transaminases and bilirubin remain WNL; triglycerides  274 ( 7/21) >> 99 (7/28)   Intake / Output:  - mIVF: NS at 30 ml/hr.  - Output:  drains:  none recorded 7/28, emesis - none recorded 7/28, stool x 3, urine x 3  GI Imaging: 7/4 MR abdomen MRCP: pancreatic mass, vertebral metastases, pancolonic diverticulosis 7/10 CT: pancreatic head equivocal fullness, small amount free pelvic fluid, widespread osseous metastatic disease, small hiatal hernia, colonic diverticulosis GI Surgeries / Procedures:  7/8 EGD 7/7 EUS, upper GI tract 7/14: bypass surgery for creation of G-Junostomy 7/22: Insertion of gastrostomy tube 7/27: CT A/P w/ oral contrast via G/J tube: mildly dilated jejunal loop centrally. Contrast is seen beyond dilated small bowel. may rep ileus or pSBO 7/28 celiac plexus block 7/29 EGD  Central access: PICC placed 7/9 TPN start date: 7/9  Nutritional Goals:  Goal TPN rate is 90 mL/hr  (provides 104 g of protein and 2238 kcals per day)  RD Assessment: Estimated Needs Total Energy Estimated Needs: 2000-2300kcal/day Total Protein Estimated Needs: 100-115g/day Total Fluid Estimated Needs: >/= 2L  Current Nutrition:  TPN at full rate = 100% support Diet: CLD (7/22)>> NPO 7/28 am Tube feeds:  Osmolite @ 10 ml/hr since 7/28 at 1600  Plan:  Now: NaPhos 15 mMol, Mag 2 gm, Ca gluconate 1 gm At 1800:  continue TPN at  full rate @ 90 mL/hr - provides 100% of needs Currently has trickle tube feeds via J-tube: Osmolite 1.5 @ 10 ml/hr - no plans to advance at this time Electrolytes in TPN:   Na 150 mEq/L (max), K 45 mEq/L, Ca 10 mEq/L (max), Mg 5 mEq/L, Phos 25 mmol/L ( max).  Cl:Ac 1:2 Add standard MVI and trace elements to TPN NS @ 30 ml/hr, for total IVF 120 ml/hr or as per MD orders Monitor TPN labs on Mon/Thurs, and as needed. BMET, Mg & Phos in AM   Rosaline IVAR Edison, Pharm.D Use secure chat for questions 01/17/2024 7:27 AM

## 2024-01-18 ENCOUNTER — Other Ambulatory Visit: Payer: Self-pay | Admitting: Nurse Practitioner

## 2024-01-18 ENCOUNTER — Ambulatory Visit: Payer: Self-pay

## 2024-01-18 ENCOUNTER — Telehealth: Payer: Self-pay

## 2024-01-18 ENCOUNTER — Encounter (HOSPITAL_COMMUNITY): Payer: Self-pay | Admitting: Anesthesiology

## 2024-01-18 ENCOUNTER — Ambulatory Visit (INDEPENDENT_AMBULATORY_CARE_PROVIDER_SITE_OTHER): Payer: MEDICAID | Admitting: Primary Care

## 2024-01-18 ENCOUNTER — Other Ambulatory Visit (HOSPITAL_COMMUNITY): Payer: Self-pay

## 2024-01-18 ENCOUNTER — Encounter: Payer: Self-pay | Admitting: General Practice

## 2024-01-18 ENCOUNTER — Encounter (HOSPITAL_COMMUNITY): Admission: EM | Payer: Self-pay | Attending: Internal Medicine

## 2024-01-18 ENCOUNTER — Other Ambulatory Visit: Payer: Self-pay

## 2024-01-18 ENCOUNTER — Encounter (INDEPENDENT_AMBULATORY_CARE_PROVIDER_SITE_OTHER): Payer: Self-pay | Admitting: Primary Care

## 2024-01-18 VITALS — BP 106/75 | HR 110 | Resp 16 | Wt 181.4 lb

## 2024-01-18 DIAGNOSIS — C7951 Secondary malignant neoplasm of bone: Secondary | ICD-10-CM

## 2024-01-18 DIAGNOSIS — Z170 Estrogen receptor positive status [ER+]: Secondary | ICD-10-CM

## 2024-01-18 DIAGNOSIS — G893 Neoplasm related pain (acute) (chronic): Secondary | ICD-10-CM

## 2024-01-18 DIAGNOSIS — Z515 Encounter for palliative care: Secondary | ICD-10-CM

## 2024-01-18 DIAGNOSIS — Z09 Encounter for follow-up examination after completed treatment for conditions other than malignant neoplasm: Secondary | ICD-10-CM

## 2024-01-18 SURGERY — EGD (ESOPHAGOGASTRODUODENOSCOPY)
Anesthesia: Monitor Anesthesia Care

## 2024-01-18 NOTE — TOC CM/SW Note (Signed)
 After Leave AMA note on 01/17/24: Patient left AMA, picc line d/c, TPN was not ordered, GT to gravity. HHPT/OT was the only service set up prior to d/c. There was no HHRN, GT was to gravity. This was an unsafe leave, & patient made that decision unfortunately.Care Mg[facility]nt is not able to provide any guidance or assistance  d/t her decision to leave AMA. Unable to provide TPN information to the infusion company rep Sharlet Herring. Will contact infusion rep, & contact patient to inform.

## 2024-01-18 NOTE — Telephone Encounter (Signed)
 T/C from pt's son stating Chelsea Hansen left the hospital before getting her EGD but she is keeping her appt today with her PCP.  He just wanted to keep you informed.

## 2024-01-18 NOTE — Progress Notes (Signed)
 [facility]  Chelsea Hansen, is a 53 y.o. female  RDW:[id number]  FMW:[id number]  DOB - 1971-05-25  Acute    Subjective:   Chelsea Hansen is a 53 y.o. female here today for an acute visit.Patient is in acute distress left AMA the day before her schedule procedure for a mass on her pancrease - explained I asked to speak with you and no one will call reviewed chart it is noted question staff no calls received. She explains the length of time she has spent in the hospital and cont to suffer from n/v . Questioned about GI tubes and what to do unknown. Needs f/u with GI. Note from Dr. Lesleigh d/c patient from hosp.- oncology and GI- Nurse called during visit and relayed same message. Difficult conversation discuss palliative care  staying home enjoying family being made comfortable added clinical nurse manager for support via teams. Explained the difference between Palliative and Hospice. After a lot of emotional crying she agreed if PCP would cont to follow her. CNM would make arrangement for paper work. Later promised would have a family conference about the decision she made.  6-7 discussed and answer individual question and concerns about the services Palliative care will provide not exactly what family wanted or expected from PCP actually disappointed   HPI  No problems updated.  Comprehensive ROS Pertinent positive and negative noted in HPI   No Known Allergies  Past Medical History:  Diagnosis Date   Allergy    seasonal allergies   Anxiety    on meds   Asthma    uses inhaler   Breast cancer (HCC) 2012   RIGHT lumpectomy   Cancer (HCC) 2022   RIGHT breast lump-dx 2022   Depression    on meds   DVT (deep venous thrombosis) (HCC) 2010   after hysterectomy   Family history of uterine cancer    GERD (gastroesophageal reflux disease)    with certain foods/OTC PRN meds   Headache(784.0)    History of radiation therapy    Bilateral breast- 09/03/21-11/02/21- Dr. Lynwood Nasuti   Hyperlipidemia    on meds   Hypertension    on meds   SVT (supraventricular tachycardia) (HCC)     Current Outpatient Medications on File Prior to Visit  Medication Sig Dispense Refill   abemaciclib  (VERZENIO ) 50 MG tablet Take 50 mg by mouth 2 (two) times daily. (Patient not taking: Reported on 12/22/2023)     albuterol  (VENTOLIN  HFA) 108 (90 Base) MCG/ACT inhaler Inhale 2 puffs by mouth into the lungs every 6 hours as needed for wheezing or shortness of breath. 18 g 2   anastrozole  (ARIMIDEX ) 1 MG tablet Take 1 tablet (1 mg total) by mouth daily. 90 tablet 3   calcitRIOL  (ROCALTROL ) 0.5 MCG capsule Take 1 capsule (0.5 mcg total) by mouth daily. 30 capsule 0   Calcium  Carb-Cholecalciferol  600-10 MG-MCG TABS Take 1 tablet by mouth 3 (three) times daily. 90 tablet 0   gabapentin  (NEURONTIN ) 300 MG capsule Take 1 capsule (300 mg total) by mouth 3 (three) times daily. 90 capsule 6   montelukast  (SINGULAIR ) 10 MG tablet Take 1 tablet (10 mg total) by mouth at bedtime. 90 tablet 3   oxyCODONE -acetaminophen  (PERCOCET) 7.5-325 MG tablet Take 1-2 tablets by mouth every 6 (six) hours as needed for severe pain (pain score 7-10). 102 tablet 0   pantoprazole  (PROTONIX ) 40 MG tablet Take 1 tablet (40 mg total) by mouth 2 (two) times daily. 60 tablet 0  polyethylene glycol powder (GLYCOLAX /MIRALAX ) 17 GM/SCOOP powder Mix 17 g in 4 oz of liquid and take by mouth 2 (two) times daily as needed. (Patient taking differently: Take 17 g by mouth 2 (two) times daily as needed (for constipation).) 3350 g 1   prochlorperazine  (COMPAZINE ) 10 MG tablet Take 1 tablet (10 mg total) by mouth every 6 (six) hours as needed for nausea or vomiting. 30 tablet 1   [Paused] propranolol  (INDERAL ) 10 MG tablet Take 1 tablet (10 mg total) by mouth 2 (two) times daily. Please call [phone] to schedule an appointment for future refills. Thank you. 3rd attempt. (Patient not taking: Reported on 12/13/2023) 30 tablet 0    scopolamine  (TRANSDERM-SCOP) 1 MG/3DAYS Place 1 patch (1.5 mg total) onto the skin every 3 (three) days as needed (Dizziness, nausea or vomiting). 10 patch 12   senna (SENOKOT) 8.6 MG TABS tablet Take 2 tablets (17.2 mg total) by mouth at bedtime. (Patient not taking: Reported on 12/22/2023) 60 tablet 3   sennosides-docusate sodium  (SENOKOT-S) 8.6-50 MG tablet Take 1 tablet by mouth daily. 120 tablet 1   Current Facility-Administered Medications on File Prior to Visit  Medication Dose Route Frequency Provider Last Rate Last Admin   sodium chloride  0.9 % bolus 1,000 mL  1,000 mL Intravenous Once [name], NP   Stopped at 12/19/23 1630   Health Maintenance  Topic Date Due   DTaP/Tdap/Td vaccine (1 - Tdap) Never done   Pneumococcal Vaccination (1 of 2 - PCV) Never done   Hepatitis B Vaccine (1 of 3 - 19+ 3-dose series) Never done   Zoster (Shingles) Vaccine (1 of 2) Never done   COVID-19 Vaccine (3 - Pfizer risk series) 12/02/2020   Flu Shot  01/20/2024   Mammogram  03/14/2025   Colon Cancer Screening  11/07/2025   Pap with HPV screening  09/15/2027   Hepatitis C Screening  Completed   HIV Screening  Completed   HPV Vaccine  Aged Out   Meningitis B Vaccine  Aged Out    Objective:  BP 106/75   Pulse (!) 110   Resp 16   Wt 181 lb 6.4 oz (82.3 kg)   SpO2 100%   BMI 25.30 kg/m    Physical Exam Constitutional:      Appearance: She is ill-appearing.  HENT:     Head: Normocephalic.     Right Ear: External ear normal.     Left Ear: External ear normal.     Nose: Nose normal.  Eyes:     Extraocular Movements: Extraocular movements intact.  Cardiovascular:     Rate and Rhythm: Normal rate and regular rhythm.  Pulmonary:     Effort: Pulmonary effort is normal.     Breath sounds: Normal breath sounds.  Abdominal:     General: Abdomen is flat. There is distension.     Palpations: Abdomen is soft.     Tenderness: There is abdominal tenderness.  Musculoskeletal:         General: Normal range of motion.  Skin:    General: Skin is warm and dry.  Neurological:     Mental Status: She is alert and oriented to person, place, and time.  Psychiatric:        Mood and Affect: Mood normal.        Behavior: Behavior normal.     Assessment & Plan   Diagnoses and all orders for this visit:  Hospital discharge follow-up See HPI    Patient have  been counseled extensively about nutrition and exercise. Other issues discussed during this visit include: low cholesterol diet, weight control and daily exercise, foot care, annual eye examinations at Ophthalmology, importance of adherence with medications and regular follow-up. We also discussed long term complications of uncontrolled diabetes and hypertension.   Schedule as needed   The patient was given clear instructions to go to ER or return to medical center if symptoms don't improve, worsen or new problems develop. The patient verbalized understanding. The patient was told to call to get lab results if they haven't heard anything in the next week.   This note has been created with Education officer, environmental. Any transcriptional errors are unintentional.   Rosaline SHAUNNA Bohr, NP 01/18/2024, 1:07 PM

## 2024-01-18 NOTE — Progress Notes (Signed)
 [facility] Spiritual Care Note  Saw that [name] left her admission in [facility] against medical advice. Phoned to follow up outpatient, leaving voicemail with encouragement to return call.  [address] [name], [place], [facility] Pager [phone] Voicemail [phone]

## 2024-01-18 NOTE — Telephone Encounter (Signed)
 FYI Only or Action Required?: Action required by provider: clinical question for provider and update on patient condition.  Patient was last seen in primary care on 12/19/2023 by [name], NP.  Called Nurse Triage reporting Information Only.  Symptoms began today.  Interventions attempted: Nothing.  Symptoms are: gradually worsening.  Triage Disposition: Call PCP Now  Patient/caregiver understands and will follow disposition?: Yes  FYI: Patient upset because she cannot get orders for her feeding tube and she is asking if she can remove it. This RN had a lengthy conversation with both patient and son advising of what the recommendation was per inpatient note. Pt was advised by Morna, NP with [facility] that she should go to [facility] or [facility] if she had any concerns after leaving AMA.  Pt upset that she may have to restart the process by going through the ED. Pt asking to speak with PCP for more direction.   Copied from CRM [id number]. Topic: Clinical - Red Word Triage >> Jan 18, 2024 10:35 AM Jayma L wrote: Red Word that prompted transfer to Nurse Triage: patient called in stated she was in hospital for 30 days and ended up leaving on her own terms yesterday and she now has a feeding tube hanging out of her stomach.. wants to know if its ok to pull it out. Reason for Disposition  [1] Caller requests to speak ONLY to PCP AND [2] URGENT question  Answer Assessment - Initial Assessment Questions 1. REASON FOR CALL or QUESTION: What is your reason for calling today? or How can I best     Patient asking if she can removed feeding tube 2. CALLER: Document the source of call. (e.g., laboratory staff, caregiver or patient).     Patient and son, Curtistine  Protocols used: PCP Call - No Triage-A-AH

## 2024-01-18 NOTE — Telephone Encounter (Signed)
 Spoke with pt and her plan is to go back to the ER because she is still having abdominal pain and this is what her PCP recommended. She is also going to contact her GI Dr to reschedule her EGD

## 2024-01-18 NOTE — Telephone Encounter (Signed)
 I spoke to the patient when she was at her appointment with Rosaline Bohr, NP at [facility] today.    We discussed the options of hospice v. palliative care.  The patient would like to receive care in her home and did not have a preference for hospice/palliative care agencies.  We explained the goals of hospice care and helping to manage her symptoms and keep her comfortable at home. She said she would not want to be put on a ventilator if the situation arose but she could not accept a DNR at this point.  We went on to explain that a referral to palliative care would be beneficial to her and her family to discuss goals of care and symptom management.  They would also be able to address questions/ concerns she has about hospice care. She wants to keep Rosaline Bohr, NP as her PCP and we told her that she can.    She is currently living with her daughter, Koleen Celia, and is staying on the first floor of [name]'s home. Her address was updated and her phone number was confirmed.  I told her that I will place the referral with [facility] and they will be reaching out to her. I also said I would ask them to contact her as soon as possible.  She was appreciative yet remains very anxious about her care needs and future.   Referral sent to [facility] Palliative as discussed.

## 2024-01-18 NOTE — Telephone Encounter (Signed)
 It was my understanding that she was going to go to the ER at [facility] or [facility] for evaluation since she left AMA from the hospital, has a G-tube, J-tube, and was on TPN.    Please ask how she is doing.  Is she having any more nausea?  How is her pain?    Please see how PCP visit went and what she might need?   Thanks, Morna

## 2024-01-19 ENCOUNTER — Other Ambulatory Visit: Payer: Self-pay

## 2024-01-19 ENCOUNTER — Telehealth (INDEPENDENT_AMBULATORY_CARE_PROVIDER_SITE_OTHER): Payer: Self-pay

## 2024-01-19 ENCOUNTER — Emergency Department (HOSPITAL_COMMUNITY)
Admission: EM | Admit: 2024-01-19 | Discharge: 2024-01-19 | Discharge status: Against Medical Advice | Payer: MEDICAID | Attending: Student | Admitting: Student

## 2024-01-19 ENCOUNTER — Emergency Department (HOSPITAL_COMMUNITY): Payer: MEDICAID

## 2024-01-19 ENCOUNTER — Encounter (HOSPITAL_COMMUNITY): Payer: Self-pay

## 2024-01-19 ENCOUNTER — Emergency Department (HOSPITAL_COMMUNITY)
Admission: EM | Admit: 2024-01-19 | Discharge: 2024-01-20 | Disposition: A | Discharge status: Home / Self Care | Payer: MEDICAID | Source: Home / Self Care | Attending: Student | Admitting: Student

## 2024-01-19 ENCOUNTER — Encounter (HOSPITAL_COMMUNITY): Payer: Self-pay | Admitting: Emergency Medicine

## 2024-01-19 ENCOUNTER — Other Ambulatory Visit (HOSPITAL_COMMUNITY): Payer: Self-pay

## 2024-01-19 DIAGNOSIS — J45909 Unspecified asthma, uncomplicated: Secondary | ICD-10-CM | POA: Insufficient documentation

## 2024-01-19 DIAGNOSIS — K9413 Enterostomy malfunction: Secondary | ICD-10-CM | POA: Insufficient documentation

## 2024-01-19 DIAGNOSIS — I10 Essential (primary) hypertension: Secondary | ICD-10-CM | POA: Insufficient documentation

## 2024-01-19 DIAGNOSIS — G8918 Other acute postprocedural pain: Secondary | ICD-10-CM | POA: Diagnosis present

## 2024-01-19 DIAGNOSIS — Z8507 Personal history of malignant neoplasm of pancreas: Secondary | ICD-10-CM | POA: Insufficient documentation

## 2024-01-19 DIAGNOSIS — Z5321 Procedure and treatment not carried out due to patient leaving prior to being seen by health care provider: Secondary | ICD-10-CM | POA: Diagnosis not present

## 2024-01-19 DIAGNOSIS — R109 Unspecified abdominal pain: Secondary | ICD-10-CM | POA: Diagnosis present

## 2024-01-19 DIAGNOSIS — K9423 Gastrostomy malfunction: Secondary | ICD-10-CM | POA: Insufficient documentation

## 2024-01-19 DIAGNOSIS — Z79899 Other long term (current) drug therapy: Secondary | ICD-10-CM | POA: Insufficient documentation

## 2024-01-19 DIAGNOSIS — Z853 Personal history of malignant neoplasm of breast: Secondary | ICD-10-CM | POA: Insufficient documentation

## 2024-01-19 LAB — COMPREHENSIVE METABOLIC PANEL WITH GFR
ALT: 55 U/L — ABNORMAL HIGH (ref 0–44)
AST: 31 U/L (ref 15–41)
Albumin: 2.9 g/dL — ABNORMAL LOW (ref 3.5–5.0)
Alkaline Phosphatase: 956 U/L — ABNORMAL HIGH (ref 38–126)
Anion gap: 11 (ref 5–15)
BUN: 12 mg/dL (ref 6–20)
CO2: 20 mmol/L — ABNORMAL LOW (ref 22–32)
Calcium: 6.6 mg/dL — ABNORMAL LOW (ref 8.9–10.3)
Chloride: 103 mmol/L (ref 98–111)
Creatinine, Ser: 0.51 mg/dL (ref 0.44–1.00)
GFR, Estimated: >60 mL/min (ref >60)
Glucose, Bld: 95 mg/dL (ref 70–99)
Potassium: 3.8 mmol/L (ref 3.5–5.1)
Sodium: 134 mmol/L — ABNORMAL LOW (ref 135–145)
Total Bilirubin: 1 mg/dL (ref 0.0–1.2)
Total Protein: 6.8 g/dL (ref 6.5–8.1)

## 2024-01-19 LAB — CBC
HCT: 27.2 % — ABNORMAL LOW (ref 36.0–46.0)
Hemoglobin: 8.5 g/dL — ABNORMAL LOW (ref 12.0–15.0)
MCH: 23.1 pg — ABNORMAL LOW (ref 26.0–34.0)
MCHC: 31.3 g/dL (ref 30.0–36.0)
MCV: 73.9 fL — ABNORMAL LOW (ref 80.0–100.0)
Platelets: 232 K/uL (ref 150–400)
RBC: 3.68 MIL/uL — ABNORMAL LOW (ref 3.87–5.11)
RDW: 19.2 % — ABNORMAL HIGH (ref 11.5–15.5)
WBC: 9 K/uL (ref 4.0–10.5)
nRBC: 1.1 % — ABNORMAL HIGH (ref 0.0–0.2)

## 2024-01-19 LAB — LIPASE, BLOOD: Lipase: 133 U/L — ABNORMAL HIGH (ref 11–51)

## 2024-01-19 MED ORDER — LACTATED RINGERS IV BOLUS
1000.0000 mL | Freq: Once | INTRAVENOUS | Status: AC
  Administered 2024-01-19: 1000 mL via INTRAVENOUS

## 2024-01-19 MED ORDER — ONDANSETRON HCL 4 MG/2ML IJ SOLN
4.0000 mg | Freq: Once | INTRAMUSCULAR | Status: AC
  Administered 2024-01-19: 4 mg via INTRAVENOUS
  Filled 2024-01-19: qty 2

## 2024-01-19 MED ORDER — IOHEXOL 300 MG/ML  SOLN
100.0000 mL | Freq: Once | INTRAMUSCULAR | Status: AC | PRN
  Administered 2024-01-19: 100 mL via INTRAVENOUS

## 2024-01-19 MED ORDER — MORPHINE SULFATE (PF) 4 MG/ML IV SOLN
6.0000 mg | Freq: Once | INTRAVENOUS | Status: AC
  Administered 2024-01-19: 6 mg via INTRAVENOUS
  Filled 2024-01-19: qty 2

## 2024-01-19 NOTE — ED Notes (Signed)
 Decided to leave before being seen.

## 2024-01-19 NOTE — ED Provider Notes (Signed)
 [place] EMERGENCY DEPARTMENT AT [facility] Provider Note  CSN: [id number] Arrival date & time: 01/19/24 1832  Chief Complaint(s) Abdominal Pain  HPI NATAJAH DERDERIAN is a 53 y.o. 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  Denies fever, nausea, vomiting, chest pain, shortness of breath or other systemic symptoms.   Past Medical History Past Medical History:  Diagnosis Date   Allergy    seasonal allergies   Anxiety    on meds   Asthma    uses inhaler   Breast cancer (HCC) 2012   RIGHT lumpectomy   Cancer (HCC) 2022   RIGHT breast lump-dx 2022   Depression    on meds   DVT (deep venous thrombosis) (HCC) 2010   after hysterectomy   Family history of uterine cancer    GERD (gastroesophageal reflux disease)    with certain foods/OTC PRN meds   Headache(784.0)    History of radiation therapy    Bilateral breast- 09/03/21-11/02/21- Dr. Lynwood Nasuti   Hyperlipidemia    on meds   Hypertension    on meds   SVT (supraventricular tachycardia) [facility])    Patient Active Problem List   Diagnosis Date Noted   Generalized abdominal pain 01/17/2024   Elevated CA 19-9 level 01/16/2024   Hypophosphatemia 01/04/2024   Hypomagnesemia 01/04/2024   Pancreatic lesion 12/29/2023   Unintentional weight loss 12/29/2023   Nausea and vomiting 12/29/2023   Duodenal obstruction 12/29/2023   Abnormal MRI of abdomen 12/26/2023   Abnormal CT of the abdomen 12/23/2023   Elevated liver enzymes 12/23/2023   Acute kidney injury (HCC) 12/23/2023   AKI  (acute kidney injury) (HCC) 12/22/2023   Transaminitis 12/22/2023   Pancreatic mass 12/22/2023   Right sided weakness 12/22/2023   Hypotension 12/22/2023   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   Abnormal CT scan, colon 12/14/2023   Acute esophagitis 12/14/2023   Candida esophagitis (HCC) 12/14/2023   Therapeutic opioid-induced constipation (OIC) 12/11/2023   Hypocalcemia 12/10/2023   Fever of unknown origin 12/10/2023   Sinus tachycardia 12/10/2023   Acute kidney injury superimposed on chronic kidney disease (HCC) 12/05/2023   Electrolyte abnormality 12/05/2023   Dehydration 12/05/2023   Prolonged QT interval 12/05/2023   Intractable nausea and vomiting 11/24/2023   Neoplasm related pain 12/07/2022   Constipation 06/30/2022   Nausea and vomiting in adult 06/29/2022   Hematemesis 06/28/2022   Breast cancer metastasized to bone, right (HCC) 05/20/2022   Supraventricular tachycardia (HCC) 07/08/2021   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   GERD (gastroesophageal reflux disease) 05/27/2021   HLD (hyperlipidemia) 05/27/2021   Essential hypertension 05/27/2021   Pain management contract signed 05/04/2021   Chronic pain syndrome (breast cancer) 05/04/2021   Palpitations 03/25/2021   Sinus bradycardia 03/25/2021   Daytime somnolence 03/25/2021   Snoring 03/25/2021   Obesity (BMI 30-39.9) 03/25/2021   Port-A-Cath in place 01/27/2021   Genetic testing 12/24/2020   Family history of uterine cancer 12/17/2020   Malignant neoplasm of upper-outer quadrant of right breast in female, estrogen receptor positive (HCC) 12/12/2020   Breast cancer (HCC) 12/12/2020   Hypokalemia 01/02/2012   Chronic asthma 01/01/2012  Bradycardia 01/01/2012   Syncope 12/31/2011   Mediastinal adenopathy 12/31/2011   Anemia 12/31/2011   Chest pain 12/31/2011   Home Medication(s) Prior to Admission medications   Medication Sig Start Date End Date Taking? Authorizing Provider  abemaciclib  (VERZENIO ) 50 MG  tablet Take 50 mg by mouth 2 (two) times daily. Patient not taking: Reported on 12/22/2023    [provider]  albuterol  (VENTOLIN  HFA) 108 (90 Base) MCG/ACT inhaler Inhale 2 puffs by mouth into the lungs every 6 hours as needed for wheezing or shortness of breath. 03/18/23   [name], NP  anastrozole  (ARIMIDEX ) 1 MG tablet Take 1 tablet (1 mg total) by mouth daily. 04/18/23   [name], NP  calcitRIOL  (ROCALTROL ) 0.5 MCG capsule Take 1 capsule (0.5 mcg total) by mouth daily. 12/15/23   [name], [name], MD  Calcium  Carb-Cholecalciferol  600-10 MG-MCG TABS Take 1 tablet by mouth 3 (three) times daily. 12/14/23 01/13/24  [name], [name], MD  gabapentin  (NEURONTIN ) 300 MG capsule Take 1 capsule (300 mg total) by mouth 3 (three) times daily. 05/26/23   [name], [name], MD  montelukast  (SINGULAIR ) 10 MG tablet Take 1 tablet (10 mg total) by mouth at bedtime. 05/01/23   [name], NP  oxyCODONE -acetaminophen  (PERCOCET) 7.5-325 MG tablet Take 1-2 tablets by mouth every 6 (six) hours as needed for severe pain (pain score 7-10). 12/21/23   [name], [name], NP  pantoprazole  (PROTONIX ) 40 MG tablet Take 1 tablet (40 mg total) by mouth 2 (two) times daily. 12/14/23   [name], [name], MD  polyethylene glycol powder (GLYCOLAX /MIRALAX ) 17 GM/SCOOP powder Mix 17 g in 4 oz of liquid and take by mouth 2 (two) times daily as needed. Patient taking differently: Take 17 g by mouth 2 (two) times daily as needed (for constipation). 11/30/23   [name], NP  prochlorperazine  (COMPAZINE ) 10 MG tablet Take 1 tablet (10 mg total) by mouth every 6 (six) hours as needed for nausea or vomiting. 12/07/23   [name], [name], MD  propranolol  (INDERAL ) 10 MG tablet Take 1 tablet (10 mg total) by mouth 2 (two) times daily. Please call [phone] to schedule an appointment for future refills. Thank you. 3rd attempt. Patient not taking: Reported on 12/13/2023 06/01/23   [name],  [name], DO  scopolamine  (TRANSDERM-SCOP) 1 MG/3DAYS Place 1 patch (1.5 mg total) onto the skin every 3 (three) days as needed (Dizziness, nausea or vomiting). 12/07/23   [name], [name], MD  senna (SENOKOT) 8.6 MG TABS tablet Take 2 tablets (17.2 mg total) by mouth at bedtime. Patient not taking: Reported on 12/22/2023 06/30/22   [name], [name], MD  sennosides-docusate sodium  (SENOKOT-S) 8.6-50 MG tablet Take 1 tablet by mouth daily. 11/30/23   [name], NP  Past Surgical History Past Surgical History:  Procedure Laterality Date   ABDOMINAL HYSTERECTOMY     AXILLARY SENTINEL NODE BIOPSY Left 07/09/2021   Procedure: LEFT AXILLARY SENTINEL NODE BIOPSY;  Surgeon: [name], MD;  Location: [facility];  Service: General;  Laterality: Left;   BALLOON DILATION N/A 12/13/2023   Procedure: BALLOON DILATION;  Surgeon: Federico Rosario BROCKS, MD;  Location: [facility] ENDOSCOPY;  Service: Gastroenterology;  Laterality: N/A;   BIOPSY  06/29/2022   Procedure: BIOPSY;  Surgeon: Legrand Victory LITTIE DOUGLAS, MD;  Location: [facility] ENDOSCOPY;  Service: Gastroenterology;;   BIOPSY OF SKIN SUBCUTANEOUS TISSUE AND/OR MUCOUS MEMBRANE  12/13/2023   Procedure: BIOPSY, SKIN, SUBCUTANEOUS TISSUE, OR MUCOUS MEMBRANE;  Surgeon: Federico Rosario BROCKS, MD;  Location: [facility] ENDOSCOPY;  Service: Gastroenterology;;   BREAST EXCISIONAL BIOPSY Right 09/16/2009   BREAST LUMPECTOMY WITH RADIOACTIVE SEED LOCALIZATION Bilateral 07/09/2021   Procedure: BILATERAL BREAST LUMPECTOMY WITH RADIOACTIVE SEED LOCALIZATION;  Surgeon: [name], MD;  Location: [facility];  Service: General;  Laterality: Bilateral;   BREAST SURGERY     lumpectomy   ESOPHAGOGASTRODUODENOSCOPY N/A 12/13/2023   Procedure: EGD (ESOPHAGOGASTRODUODENOSCOPY);  Surgeon: Federico Rosario BROCKS, MD;  Location: [facility] ENDOSCOPY;  Service:  Gastroenterology;  Laterality: N/A;   ESOPHAGOGASTRODUODENOSCOPY N/A 12/26/2023   Procedure: EGD (ESOPHAGOGASTRODUODENOSCOPY);  Surgeon: Wilhelmenia Aloha Raddle., MD;  Location: [facility];  Service: Gastroenterology;  Laterality: N/A;   ESOPHAGOGASTRODUODENOSCOPY N/A 12/27/2023   Procedure: EGD (ESOPHAGOGASTRODUODENOSCOPY);  Surgeon: Wilhelmenia Aloha Raddle., MD;  Location: [facility];  Service: Gastroenterology;  Laterality: N/A;   ESOPHAGOGASTRODUODENOSCOPY (EGD) WITH PROPOFOL  N/A 06/29/2022   Procedure: ESOPHAGOGASTRODUODENOSCOPY (EGD) WITH PROPOFOL ;  Surgeon: Legrand Victory LITTIE DOUGLAS, MD;  Location: [facility];  Service: Gastroenterology;  Laterality: N/A;   EUS N/A 12/26/2023   Procedure: ULTRASOUND, UPPER GI TRACT, ENDOSCOPIC;  Surgeon: Wilhelmenia Aloha Raddle., MD;  Location: [facility];  Service: Gastroenterology;  Laterality: N/A;   EUS N/A 12/27/2023   Procedure: ULTRASOUND, UPPER GI TRACT, ENDOSCOPIC;  Surgeon: Wilhelmenia Aloha Raddle., MD;  Location: [facility];  Service: Gastroenterology;  Laterality: N/A;   LAPAROSCOPIC INSERTION GASTROSTOMY TUBE N/A 01/10/2024   Procedure: laparoscopic insertion of gastrostomy tube, laparoscopic insertion of jejunostomy tube, upper endoscopy;  Surgeon: Kinsinger, Herlene Righter, MD;  Location: [facility];  Service: General;  Laterality: N/A;  Laproscopic insertion   LAPAROSCOPIC ROUX-EN-Y GASTRIC BYPASS WITH HIATAL HERNIA REPAIR N/A 01/02/2024   Procedure: LAPAROSCOPIC CREATION GASTRJEJONOSTOMY;  Surgeon: Lyndel Deward PARAS, MD;  Location: [facility];  Service: General;  Laterality: N/A;  GASTROJEJUNOSTOMY   PORT-A-CATH REMOVAL Left 07/09/2021   Procedure: REMOVAL PORT-A-CATH;  Surgeon: [name], MD;  Location: [facility];  Service: General;  Laterality: Left;   PORTACATH PLACEMENT Left 01/26/2021   Procedure: INSERTION PORT-A-CATH;  Surgeon: [name], MD;  Location: [facility];  Service: General;  Laterality: Left;   RADIOACTIVE SEED  GUIDED AXILLARY SENTINEL LYMPH NODE Right 07/09/2021   Procedure: RADIOACTIVE SEED GUIDED RIGHT AXILLARY SENTINEL LYMPH NODE DISSECTION;  Surgeon: [name], MD;  Location: [facility];  Service: General;  Laterality: Right;   TUBAL LIGATION     Family History Family History  Problem Relation Age of Onset   Breast cancer Mother        42s   Hypertension Father    Uterine cancer Maternal Aunt    Ovarian cancer Maternal Aunt    Breast cancer Maternal Aunt        76s   Breast cancer Cousin 38  Uterine cancer Cousin 50   Colon polyps Neg Hx    Colon cancer Neg Hx    Esophageal cancer Neg Hx    Stomach cancer Neg Hx     Social History Social History   Tobacco Use   Smoking status: Never   Smokeless tobacco: Never  Vaping Use   Vaping status: Never Used  Substance Use Topics   Alcohol use: Not Currently    Alcohol/week: 7.0 standard drinks of alcohol    Types: 7 Standard drinks or equivalent per week    Comment: occassional   Drug use: No   Allergies Patient has no known allergies.  Review of Systems Review of Systems  Gastrointestinal:  Positive for abdominal pain.    Physical Exam Vital Signs  I have reviewed the triage vital signs BP 120/74   Pulse 90   Temp 98.2 F (36.8 C) (Oral)   Resp 18   SpO2 100%   Physical Exam Vitals and nursing note reviewed.  Constitutional:      General: She is not in acute distress.    Appearance: She is well-developed.  HENT:     Head: Normocephalic and atraumatic.  Eyes:     Conjunctiva/sclera: Conjunctivae normal.  Cardiovascular:     Rate and Rhythm: Normal rate and regular rhythm.     Heart sounds: No murmur heard. Pulmonary:     Effort: Pulmonary effort is normal. No respiratory distress.     Breath sounds: Normal breath sounds.  Abdominal:     Palpations: Abdomen is soft.     Tenderness: There is abdominal tenderness.     Comments: G-tube with active stomach contents leaking around the  insertion site, minimal leaking contents from around the J-tube site  Musculoskeletal:        General: No swelling.     Cervical back: Neck supple.  Skin:    General: Skin is warm and dry.     Capillary Refill: Capillary refill takes less than 2 seconds.  Neurological:     Mental Status: She is alert.  Psychiatric:        Mood and Affect: Mood normal.     ED Results and Treatments Labs (all labs ordered are listed, but only abnormal results are displayed) Labs Reviewed  LIPASE, BLOOD - Abnormal; Notable for the following components:      Result Value   Lipase 133 (*)    All other components within normal limits  COMPREHENSIVE METABOLIC PANEL WITH GFR - Abnormal; Notable for the following components:   Sodium 134 (*)    CO2 20 (*)    Calcium  6.6 (*)    Albumin  2.9 (*)    ALT 55 (*)    Alkaline Phosphatase 956 (*)    All other components within normal limits  CBC - Abnormal; Notable for the following components:   RBC 3.68 (*)    Hemoglobin 8.5 (*)    HCT 27.2 (*)    MCV 73.9 (*)    MCH 23.1 (*)    RDW 19.2 (*)    nRBC 1.1 (*)    All other components within normal limits  Radiology CT ABDOMEN PELVIS W CONTRAST Result Date: 01/19/2024 CLINICAL DATA:  Left lower quadrant abdominal pain around the feeding tube site with leaking from the feeding tube. History of breast cancer with suspected pancreatic lesion. * Tracking Code: BO * EXAM: CT ABDOMEN AND PELVIS WITH CONTRAST TECHNIQUE: Multidetector CT imaging of the abdomen and pelvis was performed using the standard protocol following bolus administration of intravenous contrast. RADIATION DOSE REDUCTION: This exam was performed according to the departmental dose-optimization program which includes automated exposure control, adjustment of the mA and/or kV according to patient size and/or use of iterative  reconstruction technique. CONTRAST:  OMNIPAQUE  IOHEXOL  300 MG/ML  SOLN COMPARISON:  CT abdomen and pelvis dated 12/16/2023 FINDINGS: Lower chest: No focal consolidation or pulmonary nodule in the lung bases. Decreased small right pleural effusion. Partially imaged heart size is normal. Hepatobiliary: The superior most aspect of the hepatic dome is not included within the field of view. No focal hepatic lesions. No intra or extrahepatic biliary ductal dilation. Normal gallbladder. Pancreas: Similar appearance of the pancreas. No main pancreatic ductal dilation. Spleen: Normal in size without focal abnormality. Adrenals/Urinary Tract: No adrenal nodules. No suspicious renal mass, calculi or hydronephrosis. Interval resolution of previously noted right hydronephrosis. Bilateral subcentimeter hypodensities, previously characterized as simple cysts. No focal bladder wall thickening. Stomach/Bowel: Small hiatal hernia. Postsurgical changes of the stomach with patent gastrojejunostomy. Percutaneous gastrostomy balloon has retracted into the subcutaneous soft tissues of the anterior left upper quadrant (2:26) with resulting gas-filled tract to the stomach (2:25). The T tacks of the gastrostomy are present within the subcutaneous soft tissues. There are small foci of gas surrounding the displaced gastrostomy balloon. Left lower quadrant approach jejunostomy tube with slightly retracted appearance of the balloon, which appears to protrude slightly into the abdominal musculature (2:43). Jejunostomy tube now terminates within a right lower quadrant bowel loop. No evidence of bowel wall thickening, distention, or inflammatory changes. Colonic diverticulosis without acute diverticulitis. Normal appendix. Vascular/Lymphatic: No significant vascular findings are present. No enlarged abdominal or pelvic lymph nodes. Increased size and conspicuity of a peripherally enhancing 1.4 x 1.2 cm nodule along the peripheral lateral left  lower peritoneum (2:38) compared to 01/15/2024 when it was subtly seen and measured 1.0 x 0.7 cm in retrospect. Reproductive: No adnexal masses. Other: Small volume pelvic free fluid. No free air or fluid collection. Mild thickening of the inferior peritoneum (10:92). Musculoskeletal: Extensive osseous sclerotic lesions. Similar compression deformity of L3. IMPRESSION: 1. Percutaneous gastrostomy balloon has retracted into the subcutaneous soft tissues of the anterior left upper abdomen with resulting gas-filled tract to the stomach. The T tacks of the gastrostomy are present within the subcutaneous soft tissues surrounding the displaced balloon. 2. Left lower quadrant approach jejunostomy tube with slightly retracted appearance of the balloon, which appears to protrude slightly into the abdominal musculature. Jejunostomy tube now terminates within a right lower quadrant bowel loop. 3. Increased size and conspicuity of a peripherally enhancing 1.4 x 1.2 cm nodule along the peripheral lateral left lower peritoneum, suspicious for peritoneal metastasis. 4. Mild thickening of the inferior peritoneum, which may be reactive or represent peritoneal carcinomatosis. 5. Decreased small right pleural effusion. 6. Extensive osseous sclerotic lesions, consistent with metastatic disease. Electronically Signed   By: Limin  Xu M.D.   On: 01/19/2024 21:45    Pertinent labs & imaging results that were available during my care of the patient were reviewed by me and considered in my medical decision making (see MDM for details).  Medications Ordered in ED Medications  lactated ringers  bolus 1,000 mL (0 mLs Intravenous Stopped 01/20/24 0000)  morphine  (PF) 4 MG/ML injection 6 mg (6 mg Intravenous Given 01/19/24 2001)  ondansetron  (ZOFRAN ) injection 4 mg (4 mg Intravenous Given 01/19/24 2000)  iohexol  (OMNIPAQUE ) 300 MG/ML solution 100 mL (100 mLs Intravenous Contrast Given 01/19/24 2101)                                                                                                                                      Procedures Procedures  (including critical care time)  Medical Decision Making / ED Course   This patient presents to the ED for concern of abdominal pain, malfunctioning GJ and G tube., this involves an extensive number of treatment options, and is a complaint that carries with it a high risk of complications and morbidity.  The differential diagnosis includes G-tube malpositioning, kinking of the tube, fistula, obstruction, electrolyte abnormality, dehydration  MDM: Patient seen in the emergency room for evaluation of abdominal pain and evaluation of her she and J-tubes.  Physical exam with generalized tenderness to palpation of the abdomen, leakage of stomach contents around the G-tube and mild leakage of intestinal contents from the J-tube site.  Laboratory evaluation with a hemoglobin of 8.5 with an MCV of 73.9, sodium 134, albumin  2.9, ALT 55, alk phos 956, lipase 133.  CT abdomen pelvis showing the percutaneous gastrostomy balloon retracted into the subcutaneous soft tissues resulting in a gas-filled tract of the stomach with the T tracks present within the subcutaneous soft tissues surrounding the displaced balloon, slightly retracted appearance of the balloon of the left lower quadrant J-tube slightly protruding into the abdominal musculature with the J-tube terminating in the right lower quadrant bowel loop, concern for worsening peritoneal metastasis, extensive osseous sclerotic lesions.  I initially spoke with the general surgeon on-call who is recommending IR consultation.  I spoke with the interventional radiologist on-call Dr. Luverne who states that the patient can stay in the hospital and these tubes can be replaced tomorrow.  However, patient is adamant that she will not stay in the hospital and would like to pursue outpatient treatments if possible.  She is highly concerned because she was told  that she would need to stay in the hospital for another month on TPN and this brings her a lot of anxiety. Dr. Luverne was gracious enough to contact his scheduling team to help the patient pursue outpatient treatment of these tubes.  I did voice that I think she probably should be in the hospital to complete her care but I will respect her medical wishes.  Thus she was discharged with outpatient interventional radiology follow-up.  Very strict return precautions given of which she voiced understanding   Additional history obtained: -Additional history obtained from multiple friends and family -External records from outside source obtained and reviewed including: Chart review including previous notes, labs, imaging, consultation  notes   Lab Tests: -I ordered, reviewed, and interpreted labs.   The pertinent results include:   Labs Reviewed  LIPASE, BLOOD - Abnormal; Notable for the following components:      Result Value   Lipase 133 (*)    All other components within normal limits  COMPREHENSIVE METABOLIC PANEL WITH GFR - Abnormal; Notable for the following components:   Sodium 134 (*)    CO2 20 (*)    Calcium  6.6 (*)    Albumin  2.9 (*)    ALT 55 (*)    Alkaline Phosphatase 956 (*)    All other components within normal limits  CBC - Abnormal; Notable for the following components:   RBC 3.68 (*)    Hemoglobin 8.5 (*)    HCT 27.2 (*)    MCV 73.9 (*)    MCH 23.1 (*)    RDW 19.2 (*)    nRBC 1.1 (*)    All other components within normal limits     Imaging Studies ordered: I ordered imaging studies including CTAP I independently visualized and interpreted imaging. I agree with the radiologist interpretation   Medicines ordered and prescription drug management: Meds ordered this encounter  Medications   lactated ringers  bolus 1,000 mL   morphine  (PF) 4 MG/ML injection 6 mg   ondansetron  (ZOFRAN ) injection 4 mg   iohexol  (OMNIPAQUE ) 300 MG/ML solution 100 mL    -I have  reviewed the patients home medicines and have made adjustments as needed  Critical interventions none  Consultations Obtained: I requested consultation with the general surgeon and the interventional radiologist on-call,  and discussed lab and imaging findings as well as pertinent plan - they recommend: Tube replacement   Cardiac Monitoring: The patient was maintained on a cardiac monitor.  I personally viewed and interpreted the cardiac monitored which showed an underlying rhythm of: NSR  Social Determinants of Health:  Factors impacting patients care include: Currently following with palliative care in the outpatient setting   Reevaluation: After the interventions noted above, I reevaluated the patient and found that they have :improved  Co morbidities that complicate the patient evaluation  Past Medical History:  Diagnosis Date   Allergy    seasonal allergies   Anxiety    on meds   Asthma    uses inhaler   Breast cancer (HCC) 2012   RIGHT lumpectomy   Cancer (HCC) 2022   RIGHT breast lump-dx 2022   Depression    on meds   DVT (deep venous thrombosis) (HCC) 2010   after hysterectomy   Family history of uterine cancer    GERD (gastroesophageal reflux disease)    with certain foods/OTC PRN meds   Headache(784.0)    History of radiation therapy    Bilateral breast- 09/03/21-11/02/21- Dr. Lynwood Nasuti   Hyperlipidemia    on meds   Hypertension    on meds   SVT (supraventricular tachycardia) (HCC)       Dispostion: I considered admission for this patient, and I did encourage the patient to stay for hospital admission but she would like to be discharged and pursue outpatient follow-up     Final Clinical Impression(s) / ED Diagnoses Final diagnoses:  Malfunction of gastrostomy tube (HCC)  Malfunctioning jejunostomy tube [facility])     @PCDICTATION @    Albertina Dixon, MD 01/20/24 1347

## 2024-01-19 NOTE — Progress Notes (Signed)
 [facility] Liaison Note   This patient has a pending outpatient palliative care referral. [facility] liaisons will follow for discharge.   Please call with any questions.   Thank you, Eleanor Nail, [facility] Liaison [phone]

## 2024-01-19 NOTE — ED Notes (Signed)
 Pt and visitor stated that they were leaving after not being able to go back to a room.

## 2024-01-19 NOTE — Telephone Encounter (Signed)
 Copied from CRM [id number]. Topic: Medical Record Request - Provider/Facility Request >> Jan 19, 2024  3:13 PM Ivette P wrote: Reason for CRM: Bascom called in to report that pt will be followed for palliative care services and if provider Celestia has any questions can call   [phone] - secured line

## 2024-01-19 NOTE — ED Triage Notes (Signed)
 Pt has a new feeding tube that is leaking since today. Pt reports pain around the site.

## 2024-01-19 NOTE — Telephone Encounter (Signed)
 I spoke to [employer], hospital liaison- Civil engineer, contracting and he confirmed that they received the referral and he will request that the patient be contacted as soon as possible.

## 2024-01-19 NOTE — Telephone Encounter (Signed)
 I spoke to Courtney/ [facility] to confirm receipt of the referral.  She said they have not received it and asked that I re-fax it to [phone].  Referral then faxed as requested.

## 2024-01-19 NOTE — ED Triage Notes (Addendum)
 Patient c/o abdominal pain , G tube and J tube are leaking today. Patient report worsening LLQ and LUQ abdominal pain. Patent report nausea and vomiting yesteday. Patient denies fever. Hx of pancreatic Ca.

## 2024-01-20 ENCOUNTER — Encounter: Payer: Self-pay | Admitting: Hematology and Oncology

## 2024-01-20 ENCOUNTER — Other Ambulatory Visit: Payer: Self-pay | Admitting: Radiology

## 2024-01-20 ENCOUNTER — Ambulatory Visit (HOSPITAL_COMMUNITY)
Admission: RE | Admit: 2024-01-20 | Discharge: 2024-01-20 | Disposition: A | Discharge status: Home / Self Care | Payer: MEDICAID | Source: Ambulatory Visit | Attending: Interventional Radiology | Admitting: Interventional Radiology

## 2024-01-20 ENCOUNTER — Other Ambulatory Visit (HOSPITAL_COMMUNITY): Payer: Self-pay | Admitting: Interventional Radiology

## 2024-01-20 ENCOUNTER — Other Ambulatory Visit (HOSPITAL_COMMUNITY): Payer: Self-pay

## 2024-01-20 ENCOUNTER — Telehealth: Payer: Self-pay | Admitting: *Deleted

## 2024-01-20 DIAGNOSIS — C259 Malignant neoplasm of pancreas, unspecified: Secondary | ICD-10-CM

## 2024-01-20 DIAGNOSIS — R112 Nausea with vomiting, unspecified: Secondary | ICD-10-CM | POA: Insufficient documentation

## 2024-01-20 HISTORY — PX: IR REPLC GASTRO/COLONIC TUBE PERCUT W/FLUORO: IMG2333

## 2024-01-20 HISTORY — PX: IR CM INJ ANY COLONIC TUBE W/FLUORO: IMG2336

## 2024-01-20 HISTORY — PX: IR REPLC DUODEN/JEJUNO TUBE PERCUT W/FLUORO: IMG2334

## 2024-01-20 HISTORY — PX: IR FLUORO PROCEDURE UNLISTED: IMG2388

## 2024-01-20 MED ORDER — OXYCODONE-ACETAMINOPHEN 7.5-325 MG PO TABS
1.0000 | ORAL_TABLET | Freq: Four times a day (QID) | ORAL | 0 refills | Status: DC | PRN
  Filled 2024-01-20: qty 12, 3d supply, fill #0

## 2024-01-20 MED ORDER — IOHEXOL 300 MG/ML  SOLN
50.0000 mL | Freq: Once | INTRAMUSCULAR | Status: AC | PRN
  Administered 2024-01-20: 30 mL

## 2024-01-20 MED ORDER — LIDOCAINE HCL URETHRAL/MUCOSAL 2 % EX GEL: 1.0000 | Freq: Once | CUTANEOUS | Status: DC

## 2024-01-20 MED ORDER — LIDOCAINE HCL 1 % IJ SOLN
INTRAMUSCULAR | Status: AC
  Filled 2024-01-20: qty 20

## 2024-01-20 MED ORDER — LIDOCAINE VISCOUS HCL 2 % MT SOLN
OROMUCOSAL | Status: AC
  Filled 2024-01-20: qty 15

## 2024-01-20 NOTE — Telephone Encounter (Signed)
 This RN spoke with family of Shamaya today in lobby per their arrival from radiology where patient is having her G tube revised.  They state pt is out of pain meds- and aware that pt left AMA but due to above they are concerned for returning home without any coverage.  Noted onc MD and Palliative provider ( noted pain medications are being managed by our Palliative program-)not in the office today.  This RN called over to IR and spoke with tech per above need- including request is small supply to last until Monday when pt's medication can be reviewed her for further refills.  Informed above can be provided.  This RN informed pt's family of above with verbalized appreciation.

## 2024-01-23 ENCOUNTER — Telehealth: Payer: Self-pay | Admitting: *Deleted

## 2024-01-23 ENCOUNTER — Other Ambulatory Visit (HOSPITAL_COMMUNITY): Payer: Self-pay | Admitting: Interventional Radiology

## 2024-01-23 ENCOUNTER — Telehealth: Payer: Self-pay

## 2024-01-23 ENCOUNTER — Other Ambulatory Visit: Payer: Self-pay | Admitting: Hematology and Oncology

## 2024-01-23 ENCOUNTER — Encounter (HOSPITAL_COMMUNITY): Payer: Self-pay

## 2024-01-23 ENCOUNTER — Encounter (HOSPITAL_COMMUNITY): Payer: Self-pay | Admitting: Radiology

## 2024-01-23 ENCOUNTER — Other Ambulatory Visit: Payer: Self-pay

## 2024-01-23 ENCOUNTER — Telehealth (INDEPENDENT_AMBULATORY_CARE_PROVIDER_SITE_OTHER): Payer: Self-pay

## 2024-01-23 ENCOUNTER — Ambulatory Visit (HOSPITAL_COMMUNITY)
Admission: RE | Admit: 2024-01-23 | Discharge: 2024-01-23 | Disposition: A | Discharge status: Home / Self Care | Payer: MEDICAID | Source: Ambulatory Visit | Attending: Interventional Radiology | Admitting: Interventional Radiology

## 2024-01-23 ENCOUNTER — Other Ambulatory Visit (HOSPITAL_COMMUNITY): Payer: Self-pay

## 2024-01-23 DIAGNOSIS — R112 Nausea with vomiting, unspecified: Secondary | ICD-10-CM

## 2024-01-23 DIAGNOSIS — C259 Malignant neoplasm of pancreas, unspecified: Secondary | ICD-10-CM

## 2024-01-23 MED ORDER — OXYCODONE-ACETAMINOPHEN 7.5-325 MG PO TABS
1.0000 | ORAL_TABLET | Freq: Four times a day (QID) | ORAL | 0 refills | Status: DC | PRN
  Filled 2024-01-23: qty 90, 23d supply, fill #0
  Filled 2024-01-23: qty 90, 11d supply, fill #0

## 2024-01-23 NOTE — Telephone Encounter (Signed)
 Anything she needs or wants V.O. Just 1 phone all just 1.!!!!!

## 2024-01-23 NOTE — Telephone Encounter (Signed)
 Please see 8/1 telephone note.  Pt still needs a refill on her pain meds sent to [facility] She only received a prescription to last her for 3 days. Oxycodone  7.5-325 mg

## 2024-01-23 NOTE — Telephone Encounter (Signed)
 I spoke to the patient and she said she has not heard from Palliative Care yet. I told her that I will call and check on the status of the referral.  I spoke to [facility] and she said she will be reaching out to the patient shortly to schedule an appointment.  She confirmed the best number to reach the patient.    I called the patient and told her to expect a call from [facility] and to call me back if she has not heard from them by the end of the day.

## 2024-01-23 NOTE — Progress Notes (Signed)
 Oncology Continuing issues with pain.  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  She has only 3 more tablets left and therefore I am sending her 90 tablets of Percocet for her continued issues with intractable pain.  - Patient has an appointment next week with palliative care -She is undecided on what to do with her health situation.  Previously she had expressed interest in going to [facility] but she has not done anything in that regard. -Previously she had also informed us  that she is not ready for hospice.  This is a difficult situation.  If she decides on not for pursuing further treatment options, hospice care is a reasonable step to give her additional support and resources.

## 2024-01-23 NOTE — Telephone Encounter (Signed)
 This RN discussed with MD and additional providers need for pain medication refill as well as issues of compliance by pt per leaving the hospital AMA.  Plan per above is MD will refill medication- pt is to be seen this week by provider NP for discussion of what/can be done for benefit and treatment vs palliative follow up- appt made and pt aware.  Pt is already scheduled for palliative followup next week per pain management concerns and if needed appt can be rescheduled if appropriate.

## 2024-01-23 NOTE — Telephone Encounter (Signed)
 Pt contacted the office wanting information on the referral sent to palliative care  Pt is requesting a call back for details

## 2024-01-24 ENCOUNTER — Other Ambulatory Visit (HOSPITAL_COMMUNITY): Payer: Self-pay

## 2024-01-24 NOTE — Telephone Encounter (Signed)
 Signed [facility] referral emailed to [email or web address]

## 2024-01-25 ENCOUNTER — Inpatient Hospital Stay: Payer: MEDICAID

## 2024-01-25 ENCOUNTER — Encounter: Payer: Self-pay | Admitting: Hematology and Oncology

## 2024-01-25 ENCOUNTER — Other Ambulatory Visit: Payer: Self-pay

## 2024-01-25 ENCOUNTER — Other Ambulatory Visit (HOSPITAL_COMMUNITY): Payer: Self-pay | Admitting: Interventional Radiology

## 2024-01-25 ENCOUNTER — Other Ambulatory Visit (HOSPITAL_COMMUNITY): Payer: Self-pay

## 2024-01-25 ENCOUNTER — Inpatient Hospital Stay: Payer: MEDICAID | Attending: Adult Health | Admitting: Adult Health

## 2024-01-25 ENCOUNTER — Encounter (HOSPITAL_COMMUNITY): Payer: Self-pay | Admitting: Radiology

## 2024-01-25 VITALS — BP 102/64 | HR 109 | Temp 98.2°F | Resp 16 | Ht 71.0 in | Wt 172.5 lb

## 2024-01-25 DIAGNOSIS — Z170 Estrogen receptor positive status [ER+]: Secondary | ICD-10-CM

## 2024-01-25 DIAGNOSIS — K8689 Other specified diseases of pancreas: Secondary | ICD-10-CM

## 2024-01-25 DIAGNOSIS — Z5112 Encounter for antineoplastic immunotherapy: Secondary | ICD-10-CM | POA: Diagnosis present

## 2024-01-25 DIAGNOSIS — Z515 Encounter for palliative care: Secondary | ICD-10-CM | POA: Insufficient documentation

## 2024-01-25 DIAGNOSIS — I959 Hypotension, unspecified: Secondary | ICD-10-CM | POA: Insufficient documentation

## 2024-01-25 DIAGNOSIS — E869 Volume depletion, unspecified: Secondary | ICD-10-CM | POA: Diagnosis not present

## 2024-01-25 DIAGNOSIS — C50411 Malignant neoplasm of upper-outer quadrant of right female breast: Secondary | ICD-10-CM | POA: Diagnosis present

## 2024-01-25 DIAGNOSIS — R530 Neoplastic (malignant) related fatigue: Secondary | ICD-10-CM

## 2024-01-25 DIAGNOSIS — C50412 Malignant neoplasm of upper-outer quadrant of left female breast: Secondary | ICD-10-CM | POA: Diagnosis present

## 2024-01-25 DIAGNOSIS — D6481 Anemia due to antineoplastic chemotherapy: Secondary | ICD-10-CM | POA: Diagnosis not present

## 2024-01-25 DIAGNOSIS — C7951 Secondary malignant neoplasm of bone: Secondary | ICD-10-CM | POA: Diagnosis not present

## 2024-01-25 DIAGNOSIS — E876 Hypokalemia: Secondary | ICD-10-CM | POA: Insufficient documentation

## 2024-01-25 DIAGNOSIS — E878 Other disorders of electrolyte and fluid balance, not elsewhere classified: Secondary | ICD-10-CM | POA: Insufficient documentation

## 2024-01-25 DIAGNOSIS — G893 Neoplasm related pain (acute) (chronic): Secondary | ICD-10-CM | POA: Diagnosis not present

## 2024-01-25 DIAGNOSIS — K869 Disease of pancreas, unspecified: Secondary | ICD-10-CM | POA: Diagnosis not present

## 2024-01-25 DIAGNOSIS — R112 Nausea with vomiting, unspecified: Secondary | ICD-10-CM

## 2024-01-25 DIAGNOSIS — K315 Obstruction of duodenum: Secondary | ICD-10-CM

## 2024-01-25 DIAGNOSIS — E860 Dehydration: Secondary | ICD-10-CM | POA: Diagnosis not present

## 2024-01-25 DIAGNOSIS — C259 Malignant neoplasm of pancreas, unspecified: Secondary | ICD-10-CM

## 2024-01-25 DIAGNOSIS — Z79811 Long term (current) use of aromatase inhibitors: Secondary | ICD-10-CM | POA: Diagnosis not present

## 2024-01-25 DIAGNOSIS — C50911 Malignant neoplasm of unspecified site of right female breast: Secondary | ICD-10-CM

## 2024-01-25 DIAGNOSIS — K5903 Drug induced constipation: Secondary | ICD-10-CM | POA: Insufficient documentation

## 2024-01-25 LAB — CBC WITH DIFFERENTIAL (CANCER CENTER ONLY)
Abs Immature Granulocytes: 0.41 K/uL — ABNORMAL HIGH (ref 0.00–0.07)
Basophils Absolute: 0.1 K/uL (ref 0.0–0.1)
Basophils Relative: 2 %
Eosinophils Absolute: 0 K/uL (ref 0.0–0.5)
Eosinophils Relative: 1 %
HCT: 28.3 % — ABNORMAL LOW (ref 36.0–46.0)
Hemoglobin: 9 g/dL — ABNORMAL LOW (ref 12.0–15.0)
Immature Granulocytes: 8 %
Lymphocytes Relative: 29 %
Lymphs Abs: 1.5 K/uL (ref 0.7–4.0)
MCH: 23.1 pg — ABNORMAL LOW (ref 26.0–34.0)
MCHC: 31.8 g/dL (ref 30.0–36.0)
MCV: 72.8 fL — ABNORMAL LOW (ref 80.0–100.0)
Monocytes Absolute: 0.5 K/uL (ref 0.1–1.0)
Monocytes Relative: 11 %
Neutro Abs: 2.6 K/uL (ref 1.7–7.7)
Neutrophils Relative %: 49 %
Platelet Count: 271 K/uL (ref 150–400)
RBC: 3.89 MIL/uL (ref 3.87–5.11)
RDW: 18.5 % — ABNORMAL HIGH (ref 11.5–15.5)
WBC Count: 5.1 K/uL (ref 4.0–10.5)
nRBC: 1.4 % — ABNORMAL HIGH (ref 0.0–0.2)

## 2024-01-25 LAB — CMP (CANCER CENTER ONLY)
ALT: 76 U/L — ABNORMAL HIGH (ref 0–44)
AST: 50 U/L — ABNORMAL HIGH (ref 15–41)
Albumin: 4.1 g/dL (ref 3.5–5.0)
Alkaline Phosphatase: 888 U/L — ABNORMAL HIGH (ref 38–126)
Anion gap: 12 (ref 5–15)
BUN: 9 mg/dL (ref 6–20)
CO2: 31 mmol/L (ref 22–32)
Calcium: 6.7 mg/dL — ABNORMAL LOW (ref 8.9–10.3)
Chloride: 94 mmol/L — ABNORMAL LOW (ref 98–111)
Creatinine: 0.68 mg/dL (ref 0.44–1.00)
GFR, Estimated: >60 mL/min (ref >60)
Glucose, Bld: 110 mg/dL — ABNORMAL HIGH (ref 70–99)
Potassium: 3.2 mmol/L — ABNORMAL LOW (ref 3.5–5.1)
Sodium: 137 mmol/L (ref 135–145)
Total Bilirubin: 0.6 mg/dL (ref 0.0–1.2)
Total Protein: 7.8 g/dL (ref 6.5–8.1)

## 2024-01-25 LAB — MAGNESIUM: Magnesium: 1.7 mg/dL (ref 1.7–2.4)

## 2024-01-25 MED ORDER — PANTOPRAZOLE SODIUM 40 MG PO TBEC
40.0000 mg | DELAYED_RELEASE_TABLET | Freq: Two times a day (BID) | ORAL | 0 refills | Status: DC
  Filled 2024-01-25: qty 60, 30d supply, fill #0

## 2024-01-25 MED ORDER — SODIUM CHLORIDE 0.9 % IV SOLN: INTRAVENOUS | Status: AC

## 2024-01-25 MED ORDER — SODIUM CHLORIDE 0.9 % IV SOLN: INTRAVENOUS | Status: DC

## 2024-01-25 MED ORDER — PROCHLORPERAZINE MALEATE 10 MG PO TABS
10.0000 mg | ORAL_TABLET | Freq: Four times a day (QID) | ORAL | 1 refills | Status: DC | PRN
  Filled 2024-01-25: qty 30, 8d supply, fill #0

## 2024-01-25 NOTE — Telephone Encounter (Signed)
 No entry

## 2024-01-25 NOTE — Progress Notes (Signed)
 [facility] did not have the capacity to accept pt into their services. Additional order placed.

## 2024-01-25 NOTE — Patient Instructions (Signed)
 Rehydration, Adult Rehydration is the replacement of fluids, salts, and minerals in the body (electrolytes) that are lost during dehydration. Dehydration is when there is not enough water or other fluids in the body. This happens when you lose more fluids than you take in. Common causes of dehydration include: Not drinking enough fluids. This can occur when you are ill or doing activities that require a lot of energy, especially in hot weather. Conditions that cause loss of water or other fluids. These include diarrhea, vomiting, sweating, and urinating a lot. Other illnesses, such as fever or infection. Certain medicines, such as those that remove excess fluid from the body (diuretics). Symptoms of mild or moderate dehydration may include thirst, dry lips and mouth, and dizziness. Symptoms of severe dehydration may include increased heart rate, confusion, fainting, and not urinating. In severe cases, you may need to get fluids through an IV at the hospital. For mild or moderate cases, you can usually rehydrate at home by drinking certain fluids as told by your health care provider. What are the risks? Your health care provider will talk with you about risks. Your health care provider will talk with you about risks. This may include taking in too much fluid (overhydration). This is rare. Overhydration can cause an imbalance of electrolytes in the body, kidney failure, or a decrease in salt (sodium) levels in the body. Supplies needed: You will need an oral rehydration solution (ORS) if your health care provider tells you to use one. This is a drink to treat dehydration. It can be found in pharmacies and retail stores. How to rehydrate Fluids Follow instructions from your health care provider about what to drink. The kind of fluid and the amount you should drink depend on your condition. In general, you should choose drinks that you prefer. If told by your health care provider, drink an ORS. Make an  ORS by following instructions on the package. Start by drinking small amounts, about  cup (120 mL) every 5-10 minutes. Slowly increase how much you drink until you have taken in the amount recommended by your health care provider. Drink enough clear fluids to keep your urine pale yellow. If you were told to drink an ORS, finish it first, then start slowly drinking other clear fluids. Drink fluids such as: Water. This includes sparkling and flavored water. Drinking only water can lead to having too little sodium in your body (hyponatremia). Follow the advice of your health care provider. Water from ice chips you suck on. Fruit juice with water added to it (diluted). Sports drinks. Hot or cold herbal teas. Broth-based soups. Milk or milk products. Food Follow instructions from your health care provider about what to eat while you rehydrate. Your health care provider may recommend that you slowly begin eating regular foods in small amounts. Eat foods that contain a healthy balance of electrolytes, such as bananas, oranges, potatoes, tomatoes, and spinach. Avoid foods that are greasy or contain a lot of sugar. In some cases, you may get nutrition through a feeding tube that is passed through your nose and into your stomach (nasogastric tube, or NG tube). This may be done if you have uncontrolled vomiting or diarrhea. Drinks to avoid  Certain drinks may make dehydration worse. While you rehydrate, avoid drinking alcohol. How to tell if you are recovering from dehydration You may be getting better if: You are urinating more often than before you started rehydrating. Your urine is pale yellow. Your energy level improves. You vomit less  often. You have diarrhea less often. Your appetite improves or returns to normal. You feel less dizzy or light-headed. Your skin tone and color start to look more normal. Follow these instructions at home: Take over-the-counter and prescription medicines only  as told by your health care provider. Do not take sodium tablets. Doing this can lead to having too much sodium in your body (hypernatremia). Contact a health care provider if: You continue to have symptoms of mild or moderate dehydration, such as: Thirst. Dry lips. Slightly dry mouth. Dizziness. Dark urine or less urine than normal. Muscle cramps. You continue to vomit or have diarrhea. Get help right away if: You have symptoms of dehydration that get worse. You have a fever. You have a severe headache. You have been vomiting and have problems, such as: Your vomiting gets worse or does not go away. Your vomit includes blood or green matter (bile). You cannot eat or drink without vomiting. You have problems with urination or bowel movements, such as: Diarrhea that gets worse or does not go away. Blood in your stool (feces). This may cause stool to look black and tarry. Not urinating, or urinating only a small amount of very dark urine, within 6-8 hours. You have trouble breathing. You have symptoms that get worse with treatment. These symptoms may be an emergency. Get help right away. Call 911. Do not wait to see if the symptoms will go away. Do not drive yourself to the hospital. This information is not intended to replace advice given to you by your health care provider. Make sure you discuss any questions you have with your health care provider. Document Revised: 10/19/2021 Document Reviewed: 10/19/2021 Elsevier Patient Education  2024 [employer].

## 2024-01-25 NOTE — Progress Notes (Signed)
 Pt arrived ambulatory to [facility] today accompanied by two daughters. Upon assessment, pt's abd dsg is visibly saturated with serosanguinous drainage and bile from drain. Dsg was intact. Upon removal of soiled abd dsg, her skin appeared excoriated and this was reported to NP before dsg was changed. Photo was taken by NP to upload in pt's office visit. Drains were intact; however, gastrostomy drain to LUQ was loose and had bodily fluids draining around the ostomy.   Pt's skin was cleaned and non-adherent telfa pads applied around ostomy sites, as well as abd pads and Hypafix tape to cover abd. Pt and her daughters were given instructions for proper wound care and advised to change dressing at least daily and PRN pending saturation of dsg.   Pt and daughters educated to remove dressing and clean skin with antibacterial soap and clean cloths, rinse well, pat dry and apply triple paste to excoriated skin for healing, telfa, abd pads and Hypafix. Her daughters verbalized understanding of these instructions and took pictures of dressing packages.   Total time spent with pt in nursing wound care was 90 mins.

## 2024-01-25 NOTE — Progress Notes (Signed)
 [facility] is not accepting home health patients at this time. New orders for home health placed. Oncology team made aware.

## 2024-01-25 NOTE — Progress Notes (Unsigned)
 [facility] Cancer Follow up:    Chelsea Rosaline SQUIBB, NP [address] [place] [address]   DIAGNOSIS: Cancer Staging  Breast cancer [facility]) Staging form: Breast, AJCC 8th Edition - Clinical stage from 12/17/2020: Stage IA (cT1b, cN0, cM0, G2, ER+, PR+, HER2-) - Signed by [name], NP on 01/14/2022 Stage prefix: Initial diagnosis Histologic grading system: 3 grade system Laterality: Left Staged by: Pathologist and managing physician Stage used in treatment planning: Yes National guidelines used in treatment planning: Yes Type of national guideline used in treatment planning: NCCN - Pathologic stage from 07/09/2021: ypTis (DCIS), ypN0, cM0 - Signed by [name], NP on 07/22/2021 Stage prefix: Post-therapy  Malignant neoplasm of upper-outer quadrant of right breast in female, estrogen receptor positive (HCC) Staging form: Breast, AJCC 8th Edition - Clinical stage from 12/17/2020: Stage IIA (cT2, cN1, cM0, G2, ER+, PR+, HER2-) - Signed by [name], NP on 01/14/2022 Stage prefix: Initial diagnosis Histologic grading system: 3 grade system Laterality: Right Staged by: Pathologist and managing physician Stage used in treatment planning: Yes National guidelines used in treatment planning: Yes Type of national guideline used in treatment planning: NCCN - Pathologic stage from 07/09/2021: ypT2, ypN1a, cM0, G2 - Signed by [name], NP on 07/22/2021 Stage prefix: Post-therapy Histologic grading system: 3 grade system - Pathologic: Stage IV (cM1) - Signed by [name], NP on 12/07/2022    SUMMARY OF ONCOLOGIC HISTORY: Oncology History  Malignant neoplasm of upper-outer quadrant of right breast in female, estrogen receptor positive (HCC)  12/12/2020 Initial Diagnosis   status post bilateral breast biopsies 12/03/2020, showing             (1) on the right, a clinical T2 N1, stage IIa invasive lobular  carcinoma, grade 2, estrogen and progesterone receptor strongly positive, HER2 not amplified, with an MIB-1 of 40%                         (a) the biopsied right axillary lymph node was positive with extracapsular extension                         (b) a second right breast mass also biopsied was a fibroadenoma, concordant  MAMMAPRINT tested on biopsy returned high risk, luminal type B, indicating significant benefit from chemo                         (c) biopsy of an area of non-mass-like enhancement in the upper right breast pending   12/17/2020 Cancer Staging   Staging form: Breast, AJCC 8th Edition - Clinical stage from 12/17/2020: Stage IIA (cT2, cN1, cM0, G2, ER+, PR+, HER2-) - Signed by [name], NP on 01/14/2022 Stage prefix: Initial diagnosis Histologic grading system: 3 grade system Laterality: Right Staged by: Pathologist and managing physician Stage used in treatment planning: Yes National guidelines used in treatment planning: Yes Type of national guideline used in treatment planning: NCCN   12/24/2020 Genetic Testing   Negative genetic testing:  No pathogenic variants detected on the [employer] BRCAplus panel (report date 12/24/2020) or the CancerNext-Expanded + RNAinsight panel (report date 12/31/2020). 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   The BRCAplus panel offered by [employer] and includes sequencing and deletion/duplication analysis for the following 8 genes: ATM, BRCA1, BRCA2, CDH1, CHEK2, PALB2, PTEN, and  TP53. The CancerNext-Expanded + RNAinsight gene panel offered by [employer] and includes sequencing and rearrangement analysis for the following 77 genes: AIP, ALK, APC, ATM, AXIN2, BAP1, BARD1, BLM, BMPR1A, BRCA1, BRCA2, BRIP1, CDC73, CDH1, CDK4, CDKN1B, CDKN2A, CHEK2, CTNNA1, DICER1, FANCC, FH, FLCN, GALNT12, KIF1B, LZTR1, MAX, MEN1, MET, MLH1, MSH2, MSH3, MSH6, MUTYH, NBN, NF1, NF2, NTHL1, PALB2,  PHOX2B, PMS2, POT1, PRKAR1A, PTCH1, PTEN, RAD51C, RAD51D, RB1, RECQL, RET, SDHA, SDHAF2, SDHB, SDHC, SDHD, SMAD4, SMARCA4, SMARCB1, SMARCE1, STK11, SUFU, TMEM127, TP53, TSC1, TSC2, VHL and XRCC2 (sequencing and deletion/duplication); EGFR, EGLN1, HOXB13, KIT, MITF, PDGFRA, POLD1 and POLE (sequencing only); EPCAM and GREM1 (deletion/duplication only). RNA data is routinely analyzed for use in variant interpretation for all genes.   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 Multifocal sclerotic bony metastatic disease greatest in the thoracic spine with scattered rib lesions and sclerosis as well. 2. No nodal or visceral metastasis about the chest. 3. Collateral pathways about the chest suggest some subclavian venous narrowing on the LEFT. This could also be related to arm position. Correlate clinically. 4. Signs of RIGHT breast lumpectomy. Potential postoperative changes in the LEFT breast with small focal area of nodularity in the lateral LEFT breast  which could also be postoperative, correlate clinically and consider mammographic correlation as warranted. 5. Mild cardiac enlargement.   05/18/2022 Treatment Plan Change   Anastrozole  + Verzenio  100mg  PO BID   12/07/2022 Cancer Staging   Staging form: Breast, AJCC 8th Edition - Pathologic: Stage IV (cM1) - Signed by [name], NP on 12/07/2022   Breast cancer (HCC)  12/12/2020 Initial Diagnosis   on the left, a clinical T1b N0, stage IA invasive ductal carcinoma, grade 1 or 2, estrogen and progesterone receptor positive, HER2 not amplified, with an MIB 1 of 15%   12/17/2020 Cancer Staging   Staging form: Breast, AJCC 8th Edition - Clinical stage from 12/17/2020: Stage IA (cT1b, cN0, cM0, G2, ER+, PR+, HER2-) - Signed by [name], NP on 01/14/2022 Stage prefix: Initial diagnosis Histologic grading system: 3 grade system Laterality: Left Staged by: Pathologist and managing physician Stage used in treatment planning: Yes National guidelines used in treatment planning: Yes Type of national guideline used in treatment planning: NCCN   12/24/2020 Genetic Testing   Negative genetic testing:  No pathogenic variants detected on the [employer] BRCAplus panel (report date 12/24/2020) or the CancerNext-Expanded + RNAinsight panel (report date 12/31/2020). 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   The BRCAplus panel offered by [employer] and includes  sequencing and deletion/duplication analysis for the following 8 genes: ATM, BRCA1, BRCA2, CDH1, CHEK2, PALB2, PTEN, and TP53. The CancerNext-Expanded + RNAinsight gene panel offered by [employer] and includes sequencing and rearrangement analysis for the following 77 genes: AIP, ALK, APC, ATM, AXIN2, BAP1, BARD1, BLM, BMPR1A, BRCA1, BRCA2, BRIP1, CDC73, CDH1, CDK4, CDKN1B, CDKN2A, CHEK2, CTNNA1, DICER1, FANCC, FH, FLCN, GALNT12, KIF1B, LZTR1, MAX, MEN1, MET, MLH1, MSH2,  MSH3, MSH6, MUTYH, NBN, NF1, NF2, NTHL1, PALB2, PHOX2B, PMS2, POT1, PRKAR1A, PTCH1, PTEN, RAD51C, RAD51D, RB1, RECQL, RET, SDHA, SDHAF2, SDHB, SDHC, SDHD, SMAD4, SMARCA4, SMARCB1, SMARCE1, STK11, SUFU, TMEM127, TP53, TSC1, TSC2, VHL and XRCC2 (sequencing and deletion/duplication); EGFR, EGLN1, HOXB13, KIT, MITF, PDGFRA, POLD1 and POLE (sequencing only); EPCAM and GREM1 (deletion/duplication only). RNA data is routinely analyzed for use in variant interpretation for all genes.   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 Multifocal sclerotic bony metastatic disease greatest in the thoracic spine with scattered rib lesions and sclerosis as well. 2. No nodal or visceral metastasis about the chest. 3. Collateral pathways about the chest suggest some subclavian venous narrowing on the LEFT. This could also be related to arm position. Correlate clinically. 4. Signs of RIGHT breast lumpectomy. Potential postoperative changes in the LEFT breast with small focal area of nodularity in the lateral LEFT  breast which could also be postoperative, correlate clinically and consider mammographic correlation as warranted. 5. Mild cardiac enlargement.   05/18/2022 Treatment Plan Change   Anastrozole  + Verzenio  100mg  PO BID   06/23/2022 - 07/07/2022 Radiation Therapy   06/23/2022 through 07/07/2022 Site Technique Total Dose (Gy) Dose per Fx (Gy) Completed Fx Beam Energies  Lumbar Spine: Spine_LS_Pelv 3D 30/30 3 10/10 15X       CURRENT THERAPY:  INTERVAL HISTORY:  Discussed the use of AI scribe software for clinical note transcription with the patient, who gave verbal consent to proceed.  History of Present Illness Chelsea Hansen is a 52 year old female with metastatic breast cancer and a newly discovered pancreatic mass who presents for management of ongoing symptoms and care coordination.  She has a pancreatic mass that is difficult to biopsy due to anatomical challenges. Hospitalization 26 days ago aimed to improve her nutritional status for bowel healing and future biopsy. She left the hospital last week AMA.  She has ongoing drainage from a tube is present, with occasional vomiting.  She has no home health or at home care set up since she left AMA.  She was previously seen in the ER for a malpositioned tube that Dr. Edmundo corrected in IR.    Pain is managed  with Percocet every six hours, effectively controlling pain without excessive sedation or significant side effects. She uses Miralax  to prevent constipation. Current medications include Compazine  and Protonix . A recent refill of Percocet was obtained two days ago.  Her primary care provider is assisting with care, and in home palliative care has been consulted.  She does not currently receive home health services.  She tells me she is eating and is not vomiting.   At the visit today she said she felt like she was going to pass out.  Her BP was 102/64, HR 109.  This feeling resolved after she drank a glass of water .       Patient  Active Problem List   Diagnosis Date Noted   Generalized abdominal pain 01/17/2024   Elevated CA 19-9 level 01/16/2024   Hypophosphatemia 01/04/2024   Hypomagnesemia 01/04/2024   Pancreatic lesion 12/29/2023   Unintentional weight loss 12/29/2023   Nausea and vomiting 12/29/2023   Duodenal obstruction 12/29/2023   Abnormal MRI of abdomen 12/26/2023   Abnormal CT of the abdomen 12/23/2023   Elevated liver enzymes 12/23/2023   Acute kidney injury (HCC) 12/23/2023   AKI (acute kidney injury) (HCC) 12/22/2023   Transaminitis 12/22/2023   Pancreatic mass 12/22/2023   Right sided weakness 12/22/2023   Hypotension 12/22/2023   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   Abnormal CT scan, colon 12/14/2023   Acute esophagitis 12/14/2023   Candida esophagitis (HCC) 12/14/2023   Therapeutic opioid-induced constipation (OIC) 12/11/2023   Hypocalcemia 12/10/2023   Fever of unknown origin 12/10/2023   Sinus tachycardia 12/10/2023   Acute kidney injury superimposed on chronic kidney disease (HCC) 12/05/2023   Electrolyte abnormality 12/05/2023   Dehydration 12/05/2023   Prolonged QT interval 12/05/2023   Intractable nausea and vomiting 11/24/2023   Neoplasm related pain 12/07/2022   Constipation 06/30/2022   Nausea and vomiting in adult 06/29/2022   Hematemesis 06/28/2022   Breast cancer metastasized to bone, right (HCC) 05/20/2022   Supraventricular tachycardia (HCC) 07/08/2021   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   GERD (gastroesophageal reflux disease) 05/27/2021   HLD (hyperlipidemia) 05/27/2021   Essential hypertension 05/27/2021   Pain management contract signed 05/04/2021   Chronic pain syndrome (breast cancer) 05/04/2021   Palpitations 03/25/2021   Sinus bradycardia 03/25/2021   Daytime somnolence 03/25/2021   Snoring 03/25/2021   Obesity (BMI 30-39.9) 03/25/2021   Port-A-Cath in place 01/27/2021   Genetic testing 12/24/2020   Family history of uterine cancer 12/17/2020    Malignant neoplasm of upper-outer quadrant of right breast in female, estrogen receptor positive (HCC) 12/12/2020   Breast cancer (HCC) 12/12/2020   Hypokalemia 01/02/2012   Chronic asthma 01/01/2012   Bradycardia 01/01/2012   Syncope 12/31/2011   Mediastinal adenopathy 12/31/2011   Anemia 12/31/2011   Chest pain 12/31/2011    has no known allergies.  MEDICAL HISTORY: Past Medical History:  Diagnosis Date   Allergy    seasonal allergies   Anxiety    on meds   Asthma    uses inhaler   Breast cancer (HCC) 2012   RIGHT lumpectomy   Cancer (HCC) 2022   RIGHT breast lump-dx 2022   Depression    on meds   DVT (deep venous thrombosis) (HCC) 2010   after hysterectomy   Family history of uterine cancer    GERD (gastroesophageal reflux disease)    with certain foods/OTC PRN meds   Headache(784.0)    History of radiation therapy    Bilateral breast- 09/03/21-11/02/21- Dr. Lynwood  Kinard   Hyperlipidemia    on meds   Hypertension    on meds   SVT (supraventricular tachycardia) (HCC)     SURGICAL HISTORY: Past Surgical History:  Procedure Laterality Date   ABDOMINAL HYSTERECTOMY     AXILLARY SENTINEL NODE BIOPSY Left 07/09/2021   Procedure: LEFT AXILLARY SENTINEL NODE BIOPSY;  Surgeon: [name], MD;  Location: [facility];  Service: General;  Laterality: Left;   BALLOON DILATION N/A 12/13/2023   Procedure: BALLOON DILATION;  Surgeon: Federico Rosario BROCKS, MD;  Location[facility];  Service: Gastroenterology;  Laterality: N/A;   BIOPSY  06/29/2022   Procedure: BIOPSY;  Surgeon: Legrand Victory LITTIE DOUGLAS, MD;  Location: [facility];  Service: Gastroenterology;;   BIOPSY OF SKIN SUBCUTANEOUS TISSUE AND/OR MUCOUS MEMBRANE  12/13/2023   Procedure: BIOPSY, SKIN, SUBCUTANEOUS TISSUE, OR MUCOUS MEMBRANE;  Surgeon: Federico Rosario BROCKS, MD;  Location: [facility] ENDOSCOPY;  Service: Gastroenterology;;   BREAST EXCISIONAL BIOPSY Right 09/16/2009   BREAST LUMPECTOMY WITH RADIOACTIVE  SEED LOCALIZATION Bilateral 07/09/2021   Procedure: BILATERAL BREAST LUMPECTOMY WITH RADIOACTIVE SEED LOCALIZATION;  Surgeon: [name], MD;  Location: [facility];  Service: General;  Laterality: Bilateral;   BREAST SURGERY     lumpectomy   ESOPHAGOGASTRODUODENOSCOPY N/A 12/13/2023   Procedure: EGD (ESOPHAGOGASTRODUODENOSCOPY);  Surgeon: Federico Rosario BROCKS, MD;  Location: [facility] END[facility]COPY;  Service: Gastroenterology;  Laterality: N/A;   ESOPHAGOGASTRODUODENOSCOPY N/A 12/26/2023   Procedure: EGD (ESOPHAGOGASTRODUODENOSCOPY);  Surgeon: Wilhelmenia Aloha Raddle., MD;  Location: [facility];  Service: Gastroenterology;  Laterality: N/A;   ESOPHAGOGASTRODUODENOSCOPY N/A 12/27/2023   Procedure: EGD (ESOPHAGOGASTRODUODENOSCOPY);  Surgeon: Wilhelmenia Aloha Raddle., MD;  Location: [facility];  Service: Gastroenterology;  Laterality: N/A;   ESOPHAGOGASTRODUODENOSCOPY (EGD) WITH PROPOFOL  N/A 06/29/2022   Procedure: ESOPHAGOGASTRODUODENOSCOPY (EGD) WITH PROPOFOL ;  Surgeon: Legrand Victory LITTIE DOUGLAS, MD;  Location: [facility];  Service: Gastroenterology;  Laterality: N/A;   EUS N/A 12/26/2023   Procedure: ULTRASOUND, UPPER GI TRACT, ENDOSCOPIC;  Surgeon: Wilhelmenia Aloha Raddle., MD;  Location: [facility];  Service: Gastroenterology;  Laterality: N/A;   EUS N/A 12/27/2023   Procedure: ULTRASOUND, UPPER GI TRACT, ENDOSCOPIC;  Surgeon: Wilhelmenia Aloha Raddle., MD;  Location: [facility];  Service: Gastroenterology;  Laterality: N/A;   IR CM INJ ANY COLONIC TUBE W/FLUORO  01/20/2024   LAPAROSCOPIC INSERTION GASTROSTOMY TUBE N/A 01/10/2024   Procedure: laparoscopic insertion of gastrostomy tube, laparoscopic insertion of jejunostomy tube, upper endoscopy;  Surgeon: Kinsinger, Herlene Righter, MD;  Location: [facility];  Service: General;  Laterality: N/A;  Laproscopic insertion   LAPAROSCOPIC ROUX-EN-Y GASTRIC BYPASS WITH HIATAL HERNIA REPAIR N/A 01/02/2024   Procedure: LAPAROSCOPIC CREATION GASTRJEJONOSTOMY;  Surgeon:  Lyndel Deward PARAS, MD;  Location: [facility];  Service: General;  Laterality: N/A;  GASTROJEJUNOSTOMY   PORT-A-CATH REMOVAL Left 07/09/2021   Procedure: REMOVAL PORT-A-CATH;  Surgeon: [name], MD;  Location: [facility];  Service: General;  Laterality: Left;   PORTACATH PLACEMENT Left 01/26/2021   Procedure: INSERTION PORT-A-CATH;  Surgeon: [name], MD;  Location: [facility];  Service: General;  Laterality: Left;   RADIOACTIVE SEED GUIDED AXILLARY SENTINEL LYMPH NODE Right 07/09/2021   Procedure: RADIOACTIVE SEED GUIDED RIGHT AXILLARY SENTINEL LYMPH NODE DISSECTION;  Surgeon: [name], MD;  Location:  SURGERY CENTER;  Service: General;  Laterality: Right;   TUBAL LIGATION      SOCIAL HISTORY: Social History   Socioeconomic History   Marital status: Single    Spouse name: Not on file   Number of  children: Not on file   Years of education: Not on file   Highest education level: Not on file  Occupational History   Not on file  Tobacco Use   Smoking status: Never   Smokeless tobacco: Never  Vaping Use   Vaping status: Never Used  Substance and Sexual Activity   Alcohol use: Not Currently    Alcohol/week: 7.0 standard drinks of alcohol    Types: 7 Standard drinks or equivalent per week    Comment: occassional   Drug use: No   Sexual activity: Not Currently  Other Topics Concern   Not on file  Social History Narrative   Not on file   Social Drivers of Health   Financial Resource Strain: High Risk (01/14/2022)   Overall Financial Resource Strain (CARDIA)    Difficulty of Paying Living Expenses: Very hard  Food Insecurity: No Food Insecurity (12/22/2023)   Hunger Vital Sign    Worried About Running Out of Food in the Last Year: Never true    Ran Out of Food in the Last Year: Never true  Recent Concern: Food Insecurity - Food Insecurity Present (11/24/2023)   Hunger Vital Sign    Worried About Running Out of Food in the Last Year:  Sometimes true    Ran Out of Food in the Last Year: Sometimes true  Transportation Needs: No Transportation Needs (12/22/2023)   PRAPARE - Administrator, Civil Service (Medical): No    Lack of Transportation (Non-Medical): No  Physical Activity: Insufficiently Active (08/01/2023)   Exercise Vital Sign    Days of Exercise per Week: 1 day    Minutes of Exercise per Session: 10 min  Stress: Stress Concern Present (10/10/2023)   [employer] of Occupational Health - Occupational Stress Questionnaire    Feeling of Stress : Rather much  Social Connections: Not on file  Intimate Partner Violence: Not At Risk (12/22/2023)   Humiliation, Afraid, Rape, and Kick questionnaire    Fear of Current or Ex-Partner: No    Emotionally Abused: No    Physically Abused: No    Sexually Abused: No    FAMILY HISTORY: Family History  Problem Relation Age of Onset   Breast cancer Mother        26s   Hypertension Father    Uterine cancer Maternal Aunt    Ovarian cancer Maternal Aunt    Breast cancer Maternal Aunt        48s   Breast cancer Cousin 28   Uterine cancer Cousin 50   Colon polyps Neg Hx    Colon cancer Neg Hx    Esophageal cancer Neg Hx    Stomach cancer Neg Hx     Review of Systems  Constitutional:  Positive for fatigue. Negative for appetite change, chills, fever and unexpected weight change.  HENT:   Negative for hearing loss, lump/mass and trouble swallowing.   Eyes:  Negative for eye problems and icterus.  Respiratory:  Negative for chest tightness, cough and shortness of breath.   Cardiovascular:  Negative for chest pain, leg swelling and palpitations.  Gastrointestinal:  Negative for abdominal distention, abdominal pain, constipation, diarrhea, nausea and vomiting.  Endocrine: Negative for hot flashes.  Genitourinary:  Negative for difficulty urinating.   Musculoskeletal:  Positive for back pain. Negative for arthralgias.  Skin:  Negative for itching and rash.   Neurological:  Positive for light-headedness. Negative for dizziness, extremity weakness, headaches and numbness.  Hematological:  Negative for adenopathy. Does not  bruise/bleed easily.  Psychiatric/Behavioral:  Negative for depression. The patient is not nervous/anxious.       PHYSICAL EXAMINATION   Onc Performance Status - 01/25/24 0910       ECOG Perf Status   ECOG Perf Status Ambulatory and capable of all selfcare but unable to carry out any work activities.  Up and about more than 50% of waking hours      KPS SCALE   KPS % SCORE Hospitalization is necessary, very sick, active supportive treatment necessary          Vitals:   01/25/24 0908  BP: 102/64  Pulse: (!) 109  Resp: 16  Temp: 98.2 F (36.8 C)  SpO2: 100%    Physical Exam Constitutional:      General: She is not in acute distress.    Appearance: Normal appearance. She is ill-appearing (appears tired). She is not toxic-appearing.  HENT:     Head: Normocephalic and atraumatic.     Mouth/Throat:     Mouth: Mucous membranes are moist.     Pharynx: Oropharynx is clear. No oropharyngeal exudate or posterior oropharyngeal erythema.  Eyes:     General: No scleral icterus. Cardiovascular:     Rate and Rhythm: Normal rate and regular rhythm.     Pulses: Normal pulses.     Heart sounds: Normal heart sounds.  Pulmonary:     Effort: Pulmonary effort is normal.     Breath sounds: Normal breath sounds.  Abdominal:     General: Abdomen is flat. Bowel sounds are normal. There is distension.     Palpations: Abdomen is soft.     Comments: G tube and J tube in place.  She had a saturated dressing around her G tube insertion site.  Once removed by nursing, the skin was erythematous, but not warm, not swollen, and not tender.    Musculoskeletal:        General: No swelling.     Cervical back: Neck supple.  Lymphadenopathy:     Cervical: No cervical adenopathy.  Skin:    General: Skin is warm and dry.     Findings:  No rash.  Neurological:     General: No focal deficit present.     Mental Status: She is alert.  Psychiatric:        Mood and Affect: Mood normal.        Behavior: Behavior normal.   G tube insertion site 01/25/2024    LABORATORY DATA:  CBC    Component Value Date/Time   WBC 5.1 01/25/2024 0949   WBC 9.0 01/19/2024 1858   RBC 3.89 01/25/2024 0949   HGB 9.0 (L) 01/25/2024 0949   HGB 11.5 10/06/2020 1432   HCT 28.3 (L) 01/25/2024 0949   HCT 36.8 10/06/2020 1432   PLT 271 01/25/2024 0949   PLT 323 10/06/2020 1432   MCV 72.8 (L) 01/25/2024 0949   MCV 73 (L) 10/06/2020 1432   MCH 23.1 (L) 01/25/2024 0949   MCHC 31.8 01/25/2024 0949   RDW 18.5 (H) 01/25/2024 0949   RDW 15.9 (H) 10/06/2020 1432   LYMPHSABS 1.5 01/25/2024 0949   LYMPHSABS 1.9 10/06/2020 1432   MONOABS 0.5 01/25/2024 0949   EOSABS 0.0 01/25/2024 0949   EOSABS 0.1 10/06/2020 1432   BASOSABS 0.1 01/25/2024 0949   BASOSABS 0.0 10/06/2020 1432    CMP     Component Value Date/Time   NA 137 01/25/2024 0949   NA 144 11/30/2023 1000   K 3.2 (L)  01/25/2024 0949   CL 94 (L) 01/25/2024 0949   CO2 31 01/25/2024 0949   GLUCOSE 110 (H) 01/25/2024 0949   BUN 9 01/25/2024 0949   BUN 19 11/30/2023 1000   CREATININE 0.68 01/25/2024 0949   CALCIUM  6.7 (L) 01/25/2024 0949   PROT 7.8 01/25/2024 0949   PROT 7.3 11/30/2023 1000   ALBUMIN  4.1 01/25/2024 0949   ALBUMIN  4.5 11/30/2023 1000   AST 50 (H) 01/25/2024 0949   ALT 76 (H) 01/25/2024 0949   ALKPHOS 888 (H) 01/25/2024 0949   BILITOT 0.6 01/25/2024 0949   GFRNONAA >60 01/25/2024 0949   GFRAA >60 09/07/2019 1340     ASSESSMENT and THERAPY PLAN:   Assessment and Plan Assessment & Plan Pancreatic mass, pending biopsy Pancreatic mass unbiopsied due to scope passage issues. Nutritional optimization needed for bowel healing and biopsy facilitation. - Refer to a different GI group per Shona Gainer and her family's wishes for further evaluation and  management.  G-tube management G-tube still draining, indicating ongoing need. No vomiting reported, suggesting effective management. - Monitor output. - Continue current management as long as the tube is draining and there is no vomiting. - Undergo evaluation with GI to help with G and J tube management.   Chronic pain on opioid therapy Chronic pain managed with opioids effectively without excessive sedation or significant side effects such as constipation. - Continue current opioid regimen for pain management. - Ensure adequate bowel regimen to prevent constipation.  Volume depletion, receiving IV fluids Volume depletion indicated by low blood pressure and elevated heart rate, suggesting volume-related issues. - Administer IV fluids today.  Opioid-induced constipation, on bowel regimen Opioid-induced constipation managed with Miralax . Regular bowel movements with only one missed day recently. - Continue Miralax  as part of bowel regimen.  RTC in 1 week for f/u with palliative care, and in 2 weeks for f/u with me or Dr. [name].      All questions were answered. The patient knows to call the clinic with any problems, questions or concerns. We can certainly see the patient much sooner if necessary.  Total encounter time:45 minutes*in face-to-face visit time, chart review, lab review, care coordination, order entry, and documentation of the encounter time.    Morna Kendall, NP 01/25/24 10:58 AM Medical Oncology and Hematology [facility] [address] [place], [place] [address] Tel. [phone]    Fax. [phone]  *Total Encounter Time as defined by the Centers for Medicare and Medicaid Services includes, in addition to the face-to-face time of a patient visit (documented in the note above) non-face-to-face time: obtaining and reviewing outside history, ordering and reviewing medications, tests or procedures, care coordination (communications with other health  care professionals or caregivers) and documentation in the medical record.

## 2024-01-26 ENCOUNTER — Telehealth: Payer: Self-pay

## 2024-01-26 NOTE — Telephone Encounter (Signed)
 Was unable to LVM for pt son , regarding his FMLA forms. Will f/u tomorrow.

## 2024-01-27 ENCOUNTER — Encounter: Payer: Self-pay | Admitting: Adult Health

## 2024-01-27 ENCOUNTER — Encounter: Payer: Self-pay | Admitting: Hematology and Oncology

## 2024-01-27 ENCOUNTER — Ambulatory Visit: Payer: Self-pay | Admitting: *Deleted

## 2024-01-27 ENCOUNTER — Other Ambulatory Visit: Payer: Self-pay | Admitting: *Deleted

## 2024-01-27 DIAGNOSIS — Z170 Estrogen receptor positive status [ER+]: Secondary | ICD-10-CM

## 2024-01-27 NOTE — Telephone Encounter (Signed)
 RN placed call to pt with below information.  Pt states she is able to tolerate food and fluid at this time and will increase potassium rich foods.  Labs added on to next week appt.  Waiting for charge RN to okay slot in infusion for possible IVF.

## 2024-01-27 NOTE — Telephone Encounter (Signed)
-----   Message from [name] sent at 01/27/2024  1:37 PM EDT ----- Patient has decreased potassium.  Can we arrange for her to have lab before her palliative care visit next week and fluids after her palliative care visit next week so we can give some IV potassium?     Please see if she is able to keep anything down, and if so we can encouraged potassium rich foods--or have her come in tomorrow or Monday for potassium if needed. ----- Message ----- From: Rebecka, Lab In [place] Sent: 01/25/2024  10:10 AM EDT To: Morna Dalton Kendall, NP

## 2024-01-30 ENCOUNTER — Other Ambulatory Visit (HOSPITAL_COMMUNITY): Payer: Self-pay | Admitting: Surgery

## 2024-01-30 DIAGNOSIS — E43 Unspecified severe protein-calorie malnutrition: Secondary | ICD-10-CM

## 2024-01-31 ENCOUNTER — Other Ambulatory Visit (HOSPITAL_COMMUNITY): Payer: Self-pay

## 2024-01-31 ENCOUNTER — Encounter: Payer: Self-pay | Admitting: Nurse Practitioner

## 2024-01-31 ENCOUNTER — Telehealth: Payer: Self-pay | Admitting: Gastroenterology

## 2024-01-31 ENCOUNTER — Telehealth: Payer: Self-pay | Admitting: *Deleted

## 2024-01-31 ENCOUNTER — Inpatient Hospital Stay: Payer: MEDICAID | Admitting: Nurse Practitioner

## 2024-01-31 ENCOUNTER — Ambulatory Visit: Payer: MEDICAID

## 2024-01-31 ENCOUNTER — Other Ambulatory Visit: Payer: Self-pay | Admitting: *Deleted

## 2024-01-31 ENCOUNTER — Encounter: Payer: Self-pay | Admitting: Hematology and Oncology

## 2024-01-31 ENCOUNTER — Inpatient Hospital Stay: Payer: MEDICAID

## 2024-01-31 ENCOUNTER — Other Ambulatory Visit: Payer: Self-pay | Admitting: Adult Health

## 2024-01-31 ENCOUNTER — Encounter: Payer: Self-pay | Admitting: *Deleted

## 2024-01-31 VITALS — BP 78/63 | HR 84 | Temp 98.0°F | Resp 13 | Wt 167.8 lb

## 2024-01-31 VITALS — BP 95/60

## 2024-01-31 DIAGNOSIS — Z170 Estrogen receptor positive status [ER+]: Secondary | ICD-10-CM

## 2024-01-31 DIAGNOSIS — G893 Neoplasm related pain (acute) (chronic): Secondary | ICD-10-CM

## 2024-01-31 DIAGNOSIS — C50911 Malignant neoplasm of unspecified site of right female breast: Secondary | ICD-10-CM

## 2024-01-31 DIAGNOSIS — Z515 Encounter for palliative care: Secondary | ICD-10-CM

## 2024-01-31 DIAGNOSIS — C50411 Malignant neoplasm of upper-outer quadrant of right female breast: Secondary | ICD-10-CM

## 2024-01-31 DIAGNOSIS — E876 Hypokalemia: Secondary | ICD-10-CM

## 2024-01-31 DIAGNOSIS — R530 Neoplastic (malignant) related fatigue: Secondary | ICD-10-CM

## 2024-01-31 DIAGNOSIS — K5903 Drug induced constipation: Secondary | ICD-10-CM

## 2024-01-31 DIAGNOSIS — C7951 Secondary malignant neoplasm of bone: Secondary | ICD-10-CM

## 2024-01-31 DIAGNOSIS — R630 Anorexia: Secondary | ICD-10-CM

## 2024-01-31 DIAGNOSIS — Z7189 Other specified counseling: Secondary | ICD-10-CM

## 2024-01-31 DIAGNOSIS — R531 Weakness: Secondary | ICD-10-CM

## 2024-01-31 LAB — CBC WITH DIFFERENTIAL (CANCER CENTER ONLY)
Abs Immature Granulocytes: 0.24 K/uL — ABNORMAL HIGH (ref 0.00–0.07)
Basophils Absolute: 0 K/uL (ref 0.0–0.1)
Basophils Relative: 1 %
Eosinophils Absolute: 0 K/uL (ref 0.0–0.5)
Eosinophils Relative: 1 %
HCT: 25.3 % — ABNORMAL LOW (ref 36.0–46.0)
Hemoglobin: 8.4 g/dL — ABNORMAL LOW (ref 12.0–15.0)
Immature Granulocytes: 6 %
Lymphocytes Relative: 28 %
Lymphs Abs: 1.2 K/uL (ref 0.7–4.0)
MCH: 23.7 pg — ABNORMAL LOW (ref 26.0–34.0)
MCHC: 33.2 g/dL (ref 30.0–36.0)
MCV: 71.3 fL — ABNORMAL LOW (ref 80.0–100.0)
Monocytes Absolute: 0.4 K/uL (ref 0.1–1.0)
Monocytes Relative: 11 %
Neutro Abs: 2.2 K/uL (ref 1.7–7.7)
Neutrophils Relative %: 53 %
Platelet Count: 176 K/uL (ref 150–400)
RBC: 3.55 MIL/uL — ABNORMAL LOW (ref 3.87–5.11)
RDW: 17.2 % — ABNORMAL HIGH (ref 11.5–15.5)
Smear Review: NORMAL
WBC Count: 4.1 K/uL (ref 4.0–10.5)
nRBC: 0.5 % — ABNORMAL HIGH (ref 0.0–0.2)

## 2024-01-31 LAB — CMP (CANCER CENTER ONLY)
ALT: 62 U/L — ABNORMAL HIGH (ref 0–44)
AST: 43 U/L — ABNORMAL HIGH (ref 15–41)
Albumin: 3.7 g/dL (ref 3.5–5.0)
Alkaline Phosphatase: 642 U/L — ABNORMAL HIGH (ref 38–126)
Anion gap: 13 (ref 5–15)
BUN: 11 mg/dL (ref 6–20)
CO2: 34 mmol/L — ABNORMAL HIGH (ref 22–32)
Calcium: 6.3 mg/dL — CL (ref 8.9–10.3)
Chloride: 83 mmol/L — ABNORMAL LOW (ref 98–111)
Creatinine: 0.95 mg/dL (ref 0.44–1.00)
GFR, Estimated: >60 mL/min (ref >60)
Glucose, Bld: 129 mg/dL — ABNORMAL HIGH (ref 70–99)
Potassium: 2.6 mmol/L — CL (ref 3.5–5.1)
Sodium: 130 mmol/L — ABNORMAL LOW (ref 135–145)
Total Bilirubin: 0.7 mg/dL (ref 0.0–1.2)
Total Protein: 7.3 g/dL (ref 6.5–8.1)

## 2024-01-31 LAB — MAGNESIUM: Magnesium: 1.6 mg/dL — ABNORMAL LOW (ref 1.7–2.4)

## 2024-01-31 MED ORDER — SODIUM CHLORIDE 0.9 % IV SOLN
1.0000 g | Freq: Once | INTRAVENOUS | Status: AC
  Administered 2024-01-31 (×2): 1 g via INTRAVENOUS
  Filled 2024-01-31: qty 10

## 2024-01-31 MED ORDER — SODIUM CHLORIDE 0.9 % IV SOLN: INTRAVENOUS | Status: DC

## 2024-01-31 MED ORDER — SENNA 8.6 MG PO TABS
2.0000 | ORAL_TABLET | Freq: Two times a day (BID) | ORAL | 3 refills | Status: DC
  Filled 2024-01-31: qty 90, 22d supply, fill #0

## 2024-01-31 MED ORDER — POTASSIUM CHLORIDE 10 MEQ/100ML IV SOLN
10.0000 meq | INTRAVENOUS | Status: AC
  Administered 2024-01-31 (×6): 10 meq via INTRAVENOUS
  Filled 2024-01-31 (×3): qty 100

## 2024-01-31 MED ORDER — OXYCODONE-ACETAMINOPHEN 7.5-325 MG PO TABS
1.0000 | ORAL_TABLET | ORAL | 0 refills | Status: DC
  Filled 2024-02-04: qty 90, 11d supply, fill #0
  Filled ????-??-??: fill #0

## 2024-01-31 MED ORDER — POTASSIUM CHLORIDE 10 MEQ/100ML IV SOLN: 10.0000 meq | INTRAVENOUS | Status: DC

## 2024-01-31 MED ORDER — SCOPOLAMINE 1 MG/3DAYS TD PT72
1.0000 | MEDICATED_PATCH | TRANSDERMAL | 12 refills | Status: DC | PRN
  Filled 2024-01-31: qty 10, 30d supply, fill #0

## 2024-01-31 MED ORDER — SODIUM CHLORIDE 0.9 % IV SOLN: INTRAVENOUS | Status: AC

## 2024-01-31 NOTE — Telephone Encounter (Signed)
 Inbound call from patient's son stating that patient is currently at [facility] and requesting for a nurse to give patient a call to discuss further. Unclear on details of what patient is experiencing. Please advise, thank you

## 2024-01-31 NOTE — Telephone Encounter (Signed)
 Noted

## 2024-01-31 NOTE — Telephone Encounter (Signed)
 Patient called back stating she is at the [facility]. Patient is unsure why son called. Does not need a call back.

## 2024-01-31 NOTE — Telephone Encounter (Signed)
 Attempted to reach pt - no answer and voice mail full   If the pt is currently  hospitalized should discuss concerns with floor nurse

## 2024-01-31 NOTE — Progress Notes (Signed)
 Received call from Sari, RN with Dr. Lyndel with [facility] requesting if our office would take prescribing tube feedings for pt. Per MD pt needing to continue following surgery for tube feed orders.  Surgery notified and verbalized understanding.

## 2024-01-31 NOTE — Progress Notes (Signed)
 CRITICAL VALUE STICKER  CRITICAL VALUE: K 2.6 and CA 6.3  RECEIVER (on-site recipient of call): Larraine, RN  DATE & TIME NOTIFIED: 01/31/24 at 1300  MD NOTIFIED: Morna Kendall, NP  TIME OF NOTIFICATION:01/31/24 at 1310  RESPONSE:  NP notified and verbalized understanding.

## 2024-01-31 NOTE — Telephone Encounter (Signed)
 Faxed referral to [facility] at [phone]. Receipt of confirmation was provided.

## 2024-01-31 NOTE — Telephone Encounter (Signed)
 Patient is returning a call back. Please advise.

## 2024-01-31 NOTE — Patient Instructions (Signed)
 Rehydration, Adult Rehydration is the replacement of fluids, salts, and minerals in the body (electrolytes) that are lost during dehydration. Dehydration is when there is not enough water or other fluids in the body. This happens when you lose more fluids than you take in. Common causes of dehydration include: Not drinking enough fluids. This can occur when you are ill or doing activities that require a lot of energy, especially in hot weather. Conditions that cause loss of water or other fluids. These include diarrhea, vomiting, sweating, and urinating a lot. Other illnesses, such as fever or infection. Certain medicines, such as those that remove excess fluid from the body (diuretics). Symptoms of mild or moderate dehydration may include thirst, dry lips and mouth, and dizziness. Symptoms of severe dehydration may include increased heart rate, confusion, fainting, and not urinating. In severe cases, you may need to get fluids through an IV at the hospital. For mild or moderate cases, you can usually rehydrate at home by drinking certain fluids as told by your health care provider. What are the risks? Your health care provider will talk with you about risks. Your health care provider will talk with you about risks. This may include taking in too much fluid (overhydration). This is rare. Overhydration can cause an imbalance of electrolytes in the body, kidney failure, or a decrease in salt (sodium) levels in the body. Supplies needed: You will need an oral rehydration solution (ORS) if your health care provider tells you to use one. This is a drink to treat dehydration. It can be found in pharmacies and retail stores. How to rehydrate Fluids Follow instructions from your health care provider about what to drink. The kind of fluid and the amount you should drink depend on your condition. In general, you should choose drinks that you prefer. If told by your health care provider, drink an ORS. Make an  ORS by following instructions on the package. Start by drinking small amounts, about  cup (120 mL) every 5-10 minutes. Slowly increase how much you drink until you have taken in the amount recommended by your health care provider. Drink enough clear fluids to keep your urine pale yellow. If you were told to drink an ORS, finish it first, then start slowly drinking other clear fluids. Drink fluids such as: Water. This includes sparkling and flavored water. Drinking only water can lead to having too little sodium in your body (hyponatremia). Follow the advice of your health care provider. Water from ice chips you suck on. Fruit juice with water added to it (diluted). Sports drinks. Hot or cold herbal teas. Broth-based soups. Milk or milk products. Food Follow instructions from your health care provider about what to eat while you rehydrate. Your health care provider may recommend that you slowly begin eating regular foods in small amounts. Eat foods that contain a healthy balance of electrolytes, such as bananas, oranges, potatoes, tomatoes, and spinach. Avoid foods that are greasy or contain a lot of sugar. In some cases, you may get nutrition through a feeding tube that is passed through your nose and into your stomach (nasogastric tube, or NG tube). This may be done if you have uncontrolled vomiting or diarrhea. Drinks to avoid  Certain drinks may make dehydration worse. While you rehydrate, avoid drinking alcohol. How to tell if you are recovering from dehydration You may be getting better if: You are urinating more often than before you started rehydrating. Your urine is pale yellow. Your energy level improves. You vomit less  often. You have diarrhea less often. Your appetite improves or returns to normal. You feel less dizzy or light-headed. Your skin tone and color start to look more normal. Follow these instructions at home: Take over-the-counter and prescription medicines only  as told by your health care provider. Do not take sodium tablets. Doing this can lead to having too much sodium in your body (hypernatremia). Contact a health care provider if: You continue to have symptoms of mild or moderate dehydration, such as: Thirst. Dry lips. Slightly dry mouth. Dizziness. Dark urine or less urine than normal. Muscle cramps. You continue to vomit or have diarrhea. Get help right away if: You have symptoms of dehydration that get worse. You have a fever. You have a severe headache. You have been vomiting and have problems, such as: Your vomiting gets worse or does not go away. Your vomit includes blood or green matter (bile). You cannot eat or drink without vomiting. You have problems with urination or bowel movements, such as: Diarrhea that gets worse or does not go away. Blood in your stool (feces). This may cause stool to look black and tarry. Not urinating, or urinating only a small amount of very dark urine, within 6-8 hours. You have trouble breathing. You have symptoms that get worse with treatment. These symptoms may be an emergency. Get help right away. Call 911. Do not wait to see if the symptoms will go away. Do not drive yourself to the hospital. This information is not intended to replace advice given to you by your health care provider. Make sure you discuss any questions you have with your health care provider. Document Revised: 10/19/2021 Document Reviewed: 10/19/2021 Elsevier Patient Education  2024 [employer].

## 2024-02-01 ENCOUNTER — Inpatient Hospital Stay (HOSPITAL_COMMUNITY)
Admission: AD | Admit: 2024-02-01 | Discharge: 2024-02-06 | DRG: 356 | Disposition: A | Discharge status: Home / Self Care | Payer: MEDICAID | Attending: Family Medicine | Admitting: Family Medicine

## 2024-02-01 ENCOUNTER — Encounter (HOSPITAL_COMMUNITY): Payer: Self-pay

## 2024-02-01 ENCOUNTER — Encounter: Payer: Self-pay | Admitting: Hematology and Oncology

## 2024-02-01 DIAGNOSIS — K9423 Gastrostomy malfunction: Secondary | ICD-10-CM | POA: Diagnosis present

## 2024-02-01 DIAGNOSIS — K5730 Diverticulosis of large intestine without perforation or abscess without bleeding: Secondary | ICD-10-CM | POA: Diagnosis present

## 2024-02-01 DIAGNOSIS — E876 Hypokalemia: Secondary | ICD-10-CM | POA: Diagnosis present

## 2024-02-01 DIAGNOSIS — K56699 Other intestinal obstruction unspecified as to partial versus complete obstruction: Secondary | ICD-10-CM | POA: Diagnosis present

## 2024-02-01 DIAGNOSIS — Z923 Personal history of irradiation: Secondary | ICD-10-CM | POA: Diagnosis not present

## 2024-02-01 DIAGNOSIS — Z79899 Other long term (current) drug therapy: Secondary | ICD-10-CM | POA: Diagnosis not present

## 2024-02-01 DIAGNOSIS — G893 Neoplasm related pain (acute) (chronic): Secondary | ICD-10-CM | POA: Diagnosis present

## 2024-02-01 DIAGNOSIS — Z6825 Body mass index (BMI) 25.0-25.9, adult: Secondary | ICD-10-CM

## 2024-02-01 DIAGNOSIS — E43 Unspecified severe protein-calorie malnutrition: Secondary | ICD-10-CM | POA: Diagnosis present

## 2024-02-01 DIAGNOSIS — N1330 Unspecified hydronephrosis: Secondary | ICD-10-CM | POA: Diagnosis present

## 2024-02-01 DIAGNOSIS — E871 Hypo-osmolality and hyponatremia: Secondary | ICD-10-CM | POA: Diagnosis present

## 2024-02-01 DIAGNOSIS — Z8249 Family history of ischemic heart disease and other diseases of the circulatory system: Secondary | ICD-10-CM | POA: Diagnosis not present

## 2024-02-01 DIAGNOSIS — Z515 Encounter for palliative care: Secondary | ICD-10-CM | POA: Diagnosis not present

## 2024-02-01 DIAGNOSIS — I10 Essential (primary) hypertension: Secondary | ICD-10-CM | POA: Diagnosis present

## 2024-02-01 DIAGNOSIS — R627 Adult failure to thrive: Secondary | ICD-10-CM | POA: Diagnosis present

## 2024-02-01 DIAGNOSIS — I959 Hypotension, unspecified: Secondary | ICD-10-CM | POA: Diagnosis present

## 2024-02-01 DIAGNOSIS — C259 Malignant neoplasm of pancreas, unspecified: Secondary | ICD-10-CM | POA: Diagnosis present

## 2024-02-01 DIAGNOSIS — C786 Secondary malignant neoplasm of retroperitoneum and peritoneum: Secondary | ICD-10-CM | POA: Diagnosis present

## 2024-02-01 DIAGNOSIS — R000 Tachycardia, unspecified: Secondary | ICD-10-CM | POA: Diagnosis not present

## 2024-02-01 DIAGNOSIS — Z90710 Acquired absence of both cervix and uterus: Secondary | ICD-10-CM

## 2024-02-01 DIAGNOSIS — C7951 Secondary malignant neoplasm of bone: Secondary | ICD-10-CM | POA: Diagnosis present

## 2024-02-01 DIAGNOSIS — K219 Gastro-esophageal reflux disease without esophagitis: Secondary | ICD-10-CM | POA: Diagnosis present

## 2024-02-01 DIAGNOSIS — Z853 Personal history of malignant neoplasm of breast: Secondary | ICD-10-CM

## 2024-02-01 DIAGNOSIS — C250 Malignant neoplasm of head of pancreas: Secondary | ICD-10-CM | POA: Diagnosis present

## 2024-02-01 DIAGNOSIS — K56609 Unspecified intestinal obstruction, unspecified as to partial versus complete obstruction: Secondary | ICD-10-CM | POA: Diagnosis not present

## 2024-02-01 DIAGNOSIS — M8458XA Pathological fracture in neoplastic disease, other specified site, initial encounter for fracture: Secondary | ICD-10-CM | POA: Diagnosis present

## 2024-02-01 DIAGNOSIS — Y838 Other surgical procedures as the cause of abnormal reaction of the patient, or of later complication, without mention of misadventure at the time of the procedure: Secondary | ICD-10-CM | POA: Diagnosis present

## 2024-02-01 DIAGNOSIS — D696 Thrombocytopenia, unspecified: Secondary | ICD-10-CM | POA: Diagnosis present

## 2024-02-01 DIAGNOSIS — D6959 Other secondary thrombocytopenia: Secondary | ICD-10-CM | POA: Diagnosis present

## 2024-02-01 DIAGNOSIS — D630 Anemia in neoplastic disease: Secondary | ICD-10-CM | POA: Diagnosis present

## 2024-02-01 DIAGNOSIS — K5900 Constipation, unspecified: Secondary | ICD-10-CM | POA: Diagnosis present

## 2024-02-01 DIAGNOSIS — Z86718 Personal history of other venous thrombosis and embolism: Secondary | ICD-10-CM

## 2024-02-01 DIAGNOSIS — Z9884 Bariatric surgery status: Secondary | ICD-10-CM

## 2024-02-01 DIAGNOSIS — Z8041 Family history of malignant neoplasm of ovary: Secondary | ICD-10-CM

## 2024-02-01 DIAGNOSIS — Z79811 Long term (current) use of aromatase inhibitors: Secondary | ICD-10-CM

## 2024-02-01 DIAGNOSIS — K9413 Enterostomy malfunction: Secondary | ICD-10-CM | POA: Diagnosis present

## 2024-02-01 DIAGNOSIS — E785 Hyperlipidemia, unspecified: Secondary | ICD-10-CM | POA: Diagnosis present

## 2024-02-01 DIAGNOSIS — C50411 Malignant neoplasm of upper-outer quadrant of right female breast: Secondary | ICD-10-CM | POA: Diagnosis present

## 2024-02-01 DIAGNOSIS — Z5986 Financial insecurity: Secondary | ICD-10-CM

## 2024-02-01 DIAGNOSIS — Z7189 Other specified counseling: Secondary | ICD-10-CM | POA: Diagnosis not present

## 2024-02-01 DIAGNOSIS — Z8049 Family history of malignant neoplasm of other genital organs: Secondary | ICD-10-CM

## 2024-02-01 DIAGNOSIS — Z803 Family history of malignant neoplasm of breast: Secondary | ICD-10-CM

## 2024-02-01 DIAGNOSIS — K5903 Drug induced constipation: Secondary | ICD-10-CM | POA: Diagnosis not present

## 2024-02-01 DIAGNOSIS — Z8507 Personal history of malignant neoplasm of pancreas: Secondary | ICD-10-CM

## 2024-02-01 DIAGNOSIS — E440 Moderate protein-calorie malnutrition: Secondary | ICD-10-CM | POA: Insufficient documentation

## 2024-02-01 LAB — MAGNESIUM: Magnesium: 1.6 mg/dL — ABNORMAL LOW (ref 1.7–2.4)

## 2024-02-01 LAB — COMPREHENSIVE METABOLIC PANEL WITH GFR
ALT: 66 U/L — ABNORMAL HIGH (ref 0–44)
AST: 46 U/L — ABNORMAL HIGH (ref 15–41)
Albumin: 2.9 g/dL — ABNORMAL LOW (ref 3.5–5.0)
Alkaline Phosphatase: 540 U/L — ABNORMAL HIGH (ref 38–126)
Anion gap: 15 (ref 5–15)
BUN: 7 mg/dL (ref 6–20)
CO2: 29 mmol/L (ref 22–32)
Calcium: 7.1 mg/dL — ABNORMAL LOW (ref 8.9–10.3)
Chloride: 90 mmol/L — ABNORMAL LOW (ref 98–111)
Creatinine, Ser: 0.76 mg/dL (ref 0.44–1.00)
GFR, Estimated: >60 mL/min (ref >60)
Glucose, Bld: 126 mg/dL — ABNORMAL HIGH (ref 70–99)
Potassium: 2.6 mmol/L — CL (ref 3.5–5.1)
Sodium: 134 mmol/L — ABNORMAL LOW (ref 135–145)
Total Bilirubin: 0.6 mg/dL (ref 0.0–1.2)
Total Protein: 6.6 g/dL (ref 6.5–8.1)

## 2024-02-01 LAB — CBC WITH DIFFERENTIAL/PLATELET
Abs Immature Granulocytes: 0.31 K/uL — ABNORMAL HIGH (ref 0.00–0.07)
Basophils Absolute: 0 K/uL (ref 0.0–0.1)
Basophils Relative: 1 %
Eosinophils Absolute: 0 K/uL (ref 0.0–0.5)
Eosinophils Relative: 1 %
HCT: 26.3 % — ABNORMAL LOW (ref 36.0–46.0)
Hemoglobin: 8.4 g/dL — ABNORMAL LOW (ref 12.0–15.0)
Immature Granulocytes: 7 %
Lymphocytes Relative: 30 %
Lymphs Abs: 1.3 K/uL (ref 0.7–4.0)
MCH: 23.4 pg — ABNORMAL LOW (ref 26.0–34.0)
MCHC: 31.9 g/dL (ref 30.0–36.0)
MCV: 73.3 fL — ABNORMAL LOW (ref 80.0–100.0)
Monocytes Absolute: 0.4 K/uL (ref 0.1–1.0)
Monocytes Relative: 10 %
Neutro Abs: 2.2 K/uL (ref 1.7–7.7)
Neutrophils Relative %: 51 %
Platelets: 173 K/uL (ref 150–400)
RBC: 3.59 MIL/uL — ABNORMAL LOW (ref 3.87–5.11)
RDW: 17.4 % — ABNORMAL HIGH (ref 11.5–15.5)
Smear Review: NORMAL
WBC: 4.2 K/uL (ref 4.0–10.5)
nRBC: 0 % (ref 0.0–0.2)

## 2024-02-01 LAB — PROTIME-INR
INR: 1.2 (ref 0.8–1.2)
Prothrombin Time: 15.5 s — ABNORMAL HIGH (ref 11.4–15.2)

## 2024-02-01 LAB — PHOSPHORUS: Phosphorus: 3.2 mg/dL (ref 2.5–4.6)

## 2024-02-01 MED ORDER — POLYETHYLENE GLYCOL 3350 17 GM/SCOOP PO POWD
17.0000 g | Freq: Two times a day (BID) | ORAL | Status: DC
  Filled 2024-02-01: qty 119

## 2024-02-01 MED ORDER — HYDROXYZINE HCL 10 MG/5ML PO SYRP
10.0000 mg | ORAL_SOLUTION | Freq: Four times a day (QID) | ORAL | Status: DC | PRN
  Administered 2024-02-02: 10 mg via ORAL
  Filled 2024-02-01 (×3): qty 5

## 2024-02-01 MED ORDER — POLYETHYLENE GLYCOL 3350 17 G PO PACK
17.0000 g | PACK | Freq: Two times a day (BID) | ORAL | Status: DC
  Administered 2024-02-01 – 2024-02-05 (×4): 17 g via ORAL
  Filled 2024-02-01 (×5): qty 1

## 2024-02-01 MED ORDER — HEPARIN SODIUM (PORCINE) 5000 UNIT/ML IJ SOLN
5000.0000 [IU] | Freq: Three times a day (TID) | INTRAMUSCULAR | Status: DC
  Administered 2024-02-01 – 2024-02-06 (×12): 5000 [IU] via SUBCUTANEOUS
  Filled 2024-02-01 (×13): qty 1

## 2024-02-01 MED ORDER — CALCIUM GLUCONATE-NACL 2-0.675 GM/100ML-% IV SOLN
2.0000 g | Freq: Once | INTRAVENOUS | Status: DC
  Filled 2024-02-01: qty 100

## 2024-02-01 MED ORDER — SENNA 8.6 MG PO TABS
2.0000 | ORAL_TABLET | Freq: Two times a day (BID) | ORAL | Status: DC
  Administered 2024-02-01 – 2024-02-05 (×4): 17.2 mg via ORAL
  Administered 2024-02-06: 8.6 mg via ORAL
  Filled 2024-02-01 (×5): qty 2

## 2024-02-01 MED ORDER — POTASSIUM CHLORIDE 10 MEQ/100ML IV SOLN
10.0000 meq | INTRAVENOUS | Status: AC
  Administered 2024-02-01 – 2024-02-02 (×9): 10 meq via INTRAVENOUS
  Filled 2024-02-01 (×2): qty 100

## 2024-02-01 MED ORDER — OXYCODONE-ACETAMINOPHEN 7.5-325 MG PO TABS
1.0000 | ORAL_TABLET | ORAL | Status: DC | PRN
  Filled 2024-02-01: qty 1

## 2024-02-01 MED ORDER — POTASSIUM CHLORIDE IN NACL 40-0.9 MEQ/L-% IV SOLN
INTRAVENOUS | Status: DC
  Filled 2024-02-01 (×2): qty 1000

## 2024-02-01 MED ORDER — GABAPENTIN 300 MG PO CAPS
300.0000 mg | ORAL_CAPSULE | Freq: Three times a day (TID) | ORAL | Status: DC
  Administered 2024-02-01 – 2024-02-05 (×3): 300 mg via ORAL
  Filled 2024-02-01 (×8): qty 1

## 2024-02-01 MED ORDER — MAGNESIUM SULFATE 2 GM/50ML IV SOLN
2.0000 g | Freq: Once | INTRAVENOUS | Status: AC
  Administered 2024-02-01 (×2): 2 g via INTRAVENOUS

## 2024-02-01 MED ORDER — HYDRALAZINE HCL 20 MG/ML IJ SOLN: 5.0000 mg | INTRAMUSCULAR | Status: DC | PRN

## 2024-02-01 MED ORDER — MORPHINE SULFATE (PF) 2 MG/ML IV SOLN
2.0000 mg | INTRAVENOUS | Status: DC | PRN
  Administered 2024-02-01 – 2024-02-06 (×15): 2 mg via INTRAVENOUS
  Filled 2024-02-01 (×14): qty 1

## 2024-02-01 MED ORDER — PROCHLORPERAZINE MALEATE 10 MG PO TABS
10.0000 mg | ORAL_TABLET | Freq: Four times a day (QID) | ORAL | Status: DC | PRN
  Administered 2024-02-05: 10 mg via ORAL
  Filled 2024-02-01 (×2): qty 1

## 2024-02-01 MED ORDER — OXYCODONE-ACETAMINOPHEN 7.5-325 MG PO TABS
1.0000 | ORAL_TABLET | ORAL | Status: DC | PRN
  Administered 2024-02-01 – 2024-02-03 (×7): 1 via ORAL
  Filled 2024-02-01 (×5): qty 1

## 2024-02-01 MED ORDER — CALCIUM GLUCONATE-NACL 1-0.675 GM/50ML-% IV SOLN
1.0000 g | Freq: Once | INTRAVENOUS | Status: AC
  Administered 2024-02-01 (×2): 1000 mg via INTRAVENOUS
  Filled 2024-02-01: qty 50

## 2024-02-01 MED ORDER — MAGNESIUM SULFATE 2 GM/50ML IV SOLN
2.0000 g | Freq: Once | INTRAVENOUS | Status: AC
  Administered 2024-02-01 (×2): 2 g via INTRAVENOUS
  Filled 2024-02-01: qty 50

## 2024-02-01 MED ORDER — ONDANSETRON HCL 4 MG/2ML IJ SOLN
4.0000 mg | Freq: Once | INTRAMUSCULAR | Status: AC
  Administered 2024-02-01 (×2): 4 mg via INTRAVENOUS
  Filled 2024-02-01: qty 2

## 2024-02-01 MED ORDER — PANTOPRAZOLE SODIUM 40 MG PO TBEC
40.0000 mg | DELAYED_RELEASE_TABLET | Freq: Two times a day (BID) | ORAL | Status: DC
  Administered 2024-02-01 – 2024-02-06 (×11): 40 mg via ORAL
  Filled 2024-02-01 (×10): qty 1

## 2024-02-01 NOTE — Plan of Care (Signed)
  Problem: Clinical Measurements: Goal: Respiratory complications will improve Outcome: Progressing   Problem: Activity: Goal: Risk for activity intolerance will decrease Outcome: Progressing   Problem: Coping: Goal: Level of anxiety will decrease Outcome: Not Progressing   Problem: Pain Managment: Goal: General experience of comfort will improve and/or be controlled Outcome: Not Progressing

## 2024-02-01 NOTE — Progress Notes (Signed)
 Palliative Medicine [facility]  [phone] [phone]   Name: Chelsea Hansen Date: 02/01/2024 MRN: [id number]  DOB: 08-11-1970  Patient Care Team: [name], NP as PCP - General (Internal Medicine) [name], DO as PCP - Cardiology (Cardiology) [name], MD as Consulting Physician (Radiation Oncology) [name], MD as Consulting Physician (General Surgery) [name], MD as Consulting Physician (Hematology and Oncology) [name], [name], NP as Nurse Practitioner (Nurse Practitioner) Crawford, Morna Pickle, NP as Nurse Practitioner (Hematology and Oncology)   INTERVAL HISTORY: Chelsea Hansen is a 53 y.o. 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  MRI of L-spine (12/23) showed L3 pathologic fracture and significant lesion along S1 with sclerotic changes.  Palliative ask to see for symptom management and goals of care.   SOCIAL HISTORY:     reports that she has never smoked. She has never used smokeless tobacco. She reports that she does not currently use alcohol after a past usage of about 7.0 standard drinks of alcohol per week. She reports that she does not use drugs.  ADVANCE DIRECTIVES:   CODE STATUS:   PAST MEDICAL HISTORY: Past Medical History:  Diagnosis Date   Allergy    seasonal allergies   Anxiety    on meds   Asthma    uses inhaler   Breast cancer (HCC) 2012   RIGHT lumpectomy   Cancer (HCC) 2022   RIGHT breast lump-dx 2022   Depression    on meds   DVT (deep venous thrombosis) (HCC) 2010   after hysterectomy   Family history of uterine cancer    GERD (gastroesophageal  reflux disease)    with certain foods/OTC PRN meds   Headache(784.0)    History of radiation therapy    Bilateral breast- 09/03/21-11/02/21- Dr. Agent Shannon   Hyperlipidemia    on meds   Hypertension    on meds   SVT (supraventricular tachycardia) (HCC)     ALLERGIES:  has no known allergies.  MEDICATIONS:  Current Outpatient Medications  Medication Sig Dispense Refill   abemaciclib  (VERZENIO ) 50 MG tablet Take 50 mg by mouth 2 (two) times daily. (Patient not taking: Reported on 01/25/2024)     albuterol  (VENTOLIN  HFA) 108 (90 Base) MCG/ACT inhaler Inhale 2 puffs by mouth into the lungs every 6 hours as needed for wheezing or shortness of breath. 18 g 2   anastrozole  (ARIMIDEX ) 1 MG tablet Take 1 tablet (1 mg total) by mouth daily. (Patient not taking: Reported on 01/25/2024) 90 tablet 3   calcitRIOL  (ROCALTROL ) 0.5 MCG capsule Take 1 capsule (0.5 mcg total) by mouth daily. (Patient not taking: Reported on 01/25/2024) 30 capsule 0   Calcium  Carb-Cholecalciferol  600-10 MG-MCG TABS Take 1 tablet by mouth 3 (three) times daily. (Patient not taking: Reported on 01/25/2024) 90 tablet 0   gabapentin  (NEURONTIN ) 300 MG capsule Take 1 capsule (300 mg total) by mouth 3 (three) times daily. 90 capsule 6   montelukast  (SINGULAIR ) 10 MG tablet Take 1 tablet (10 mg total) by mouth at bedtime. 90 tablet 3   [START ON 02/04/2024] oxyCODONE -acetaminophen  (PERCOCET) 7.5-325 MG tablet Take 1 tablet by mouth every 4 (four) hours as needed for severe pain (pain score 7-10) (Take 1-2 tablets  by mouth every 6 (six) hours as needed for severe pain (pain score 7-10)). Max daily dose 8 tablets per provider. 90 tablet 0   pantoprazole  (PROTONIX ) 40 MG tablet Take 1 tablet (40 mg total) by mouth 2 (two) times daily. 60 tablet 0   polyethylene glycol powder (GLYCOLAX /MIRALAX ) 17 GM/SCOOP powder Mix 17 g in 4 oz of liquid and take by mouth 2 (two) times daily as needed. 3350 g 1   prochlorperazine  (COMPAZINE ) 10 MG tablet  Take 1 tablet (10 mg total) by mouth every 6 (six) hours as needed for nausea or vomiting. 30 tablet 1   [Paused] propranolol  (INDERAL ) 10 MG tablet Take 1 tablet (10 mg total) by mouth 2 (two) times daily. Please call [phone] to schedule an appointment for future refills. Thank you. 3rd attempt. (Patient not taking: Reported on 01/25/2024) 30 tablet 0   scopolamine  (TRANSDERM-SCOP) 1 MG/3DAYS Place 1 patch (1.5 mg total) onto the skin behind ear every 3 (three) days as needed for dizziness, nausea or vomiting. 10 patch 12   senna (SENOKOT) 8.6 MG TABS tablet Take 2 tablets (17.2 mg total) by mouth 2 (two) times daily. 90 tablet 3   Current Facility-Administered Medications  Medication Dose Route Frequency Provider Last Rate Last Admin   sodium chloride  0.9 % bolus 1,000 mL  1,000 mL Intravenous Once [name], NP   Stopped at 12/19/23 1630    VITAL SIGNS: BP (!) 78/63 (BP Location: Right Arm) Comment: LC, NP aware per secure chat.  Pulse 84   Temp 98 F (36.7 C) (Temporal)   Resp 13   Wt 167 lb 12.8 oz (76.1 kg)   SpO2 99%   BMI 23.40 kg/m  Filed Weights   01/31/24 1211  Weight: 167 lb 12.8 oz (76.1 kg)     Estimated body mass index is 23.4 kg/m as calculated from the following:   Height as of 01/25/24: 5' 11 (1.803 m).   Weight as of this encounter: 167 lb 12.8 oz (76.1 kg).   PERFORMANCE STATUS (ECOG) : 1 - Symptomatic but completely ambulatory  Assessment NAD, weak appearing  RRR, hypotensive Normal breathing pattern  Abdominal tenderness, G-tube and J-tube in place. Dressings intact.  AAO x3  IMPRESSION: Discussed the use of AI scribe software for clinical note transcription with the patient, who gave verbal consent to proceed.  History of Present Illness Chelsea Hansen is a 53 year old female with metastatic breast cancer who presents to palliative clinic with pain management and feeding tube challenges. Of not patient recently left hospital on 7/29 AMA  after a long-hospitalization (26 days) requiring G and J-tube placement, multiple procedures, pain management, and what appears to be a new pancreatic mass.   She experiences constipation and has not had a bowel movement in a couple of days. She takes Miralax  twice a day and has a prescription for Senokot, which she has not yet picked up. She also takes Protonix  for stomach acid and indigestion, and Compazine  for nausea, but has not picked up these medications either. We discussed at length importance of bowel regimen to prevent constipation which can attribute to her abdominal discomfort. She verbalized understanding.   Her energy levels fluctuate, and she currently feels she has no energy. She experiences pain in her right arm and hip, which may be due to limited movement. She is awaiting a GI appointment scheduled for September 17th. Is emotional and frustrated with lack of ability to perform care needs and challenges  with home health set-up. Patient has been aspirating content from her G-tube however no medical management has been performed on her J-tube.   She reports G tube and a J tube causing discomfort. She has not been flushing the tubes, patient and family have been performing self-dressings. Yessika states she is concerned about affordability of continuing to purchase home dressing supplies. She speaks that she is unsure how to care for the drains as this was not provided to her. She has been eating soft foods without vomiting and has not been using the feeding tube for nutrition. Patient is scheduled for upcoming PICC line placement with consideration for TPN per surgery.  She is inquiring about management of tube feedings which is to be administered via tube and if she has complications with tubes proper medical team to contact. Ms. Vanhoose states she was recently seen by surgeon who has expressed tube maintenance is to be performed outside of their office.   Education provided on tube maintenance  however I shared concerns that she is to have follow-up with GI or outside medical team to care for tubes given reasoning for placement and specialty of others that can confirm tube is properly functioning, have the ability to trouble shoot if she experience difficulty, and determine the best tube feedings to prescribed based on her personal nutritional needs and lab values. She understands palliative is unable to manage tube feedings as this is not a specialty of our scope and out of concern for stability of use given recent events during hospitalization that required multiple disciplinarians (GI and Surgery) to be involved.   I personally contacted [facility] office  to request sooner appointment and explain areas of concern. Unfortunately earliest appointment available will be September 17th. Patient is scheduled and I requested she be placed on waitlist in case of cancellations. Patient verbalized understanding and appreciation.   Maygan, RN has attempted to assist medical team with connecting patient with home health services however challenges have been presented leading to decline from multiple home health agencies due to insurance, care needs, need for medical management by provider which include agencies such as [facility] and [facility] home health.   At this time no other resources to consider and patient will most likely require outside work-up and support which may result in rehospitalization pending current presentation that includes weakness, fatigue, need for medical management by multiple disciplinarians, and close monitoring for stability in the setting of electrolyte imbalance, hypotension, and weakness. She is being closely managed by the Oncology team to determine next steps.   Galena experiences severe pain in her abdomen, right hip, and lower back, described as 'crazy' and rated 'almost a twenty every day' on a scale of zero to ten. She takes oxycodone  7.5 mg every four hours for pain  relief, although it was initially prescribed for every six hours. She also takes gabapentin  once a day instead of the recommended three times a day, and finds it ineffective. Patient was previously on extended-release oxycodone  however her insurance has declined to continue paying and she is unable to financially pay out of pocket monthly. We discussed goal of pain management and safest options for patient to allow for improvement in her quality of life. She understands pain will not resolve however our goal is focus on getting her to a manageable state. We will work to see if we can get methadone or fentanyl  patch approved to allow for a more extended relief method as well as considering medications to limit  concerns for nausea/vomiting. I also provided extensive details on possibility of placing pain pump by myself and colleagues in collaboration with IR. Patient is open to all options. I will also be mindful of need if patient is hospitalization and closely follow during hospitalization for support while assisting medical team with symptom management.   Goals of Care We discussed her current illness at length and disease trajectory. Ms. teagen bucio understanding that her health has significantly change, quality of life has declined, and the challenges that the team are trying to assist patient with to the best of our ability.   We discussed seriousness of her overall condition and possible need for hospitalization. Patient does not enjoy being hospitalized. During previous hospitalization there was discussions and consideration for patient to follow-up at another educational institution however this has not been an option for her after discussing with family concerns are location and being away from home if something happened and If anything different could be done in addition to what the follow-ups would look like specifically having to drive out of town etc.   Patient is clear in her expressed  wishes to continue to treat the treatable aggressively. She is not interested in consider comfort focused or end-of-life care at this time. Although she has some uncertainty of next steps or her wishes on how to proceed living is most important to her. Emotional support provided.   Elberta is appreciative of ongoing support. All questions answered and support provided to the best of my ability.   We will continue to closely monitor.   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 Pain rated as 20/10. Current management includes oxycodone  and gabapentin , with gabapentin  possibly underdosed. Discussed potential future use of an intrathecal pain pump for improved pain control. - Continue oxycodone  7.5 mg every 4-6 hours as needed. - Increase gabapentin  to three times daily. - Discuss potential intrathecal pain pump. Methadone, or fentanyl  patch at future visits pending visit outcomes today.  - Schedule celiac plexus block on August 18th.  Feeding tube (G-tube and J-tube) management Issues with feeding tube management, including lack of flushing and incorrect use, causing significant discomfort, especially on the left side. She was not informed about proper flushing techniques. - Instruct to flush  J-tube with 30 cc of water  daily to prevent clogging. - Contact surgeon or GI for feeding tube management. - Schedule GI appointment sooner than September 17th if possible. Spoke with [facility] personally. This is earliest appointment. Patient has been placed on waiting list.  -It will be important for patient to establish care to allow for medical management and support of all tubes. Palliative unable to offer this support due to nature and need for specialty support. Patient is aware medical team is working to streamline this for her.   Constipation Constipation. Last bowel  movement was a couple of days ago. Currently using Miralax  twice daily. - Prescribe Senokot, two tablets at bedtime. - Continue Miralax  twice daily.  Nausea Nausea management includes Compazine  and instructions for venting the feeding tube if nauseated. No current use of the scopolamine  patch. - Continue Compazine  as needed. - Educate on venting the feeding tube to relieve nausea. - Consider ordering a scopolamine  patch for additional control.  Hypokalemia (pending lab confirmation) Awaiting lab results to confirm hypokalemia. - Monitor potassium levels and adjust treatment based on lab results per Morna, NP.  Hypocalcemia Confirmed hypocalcemia with calcium  level at 6.3.Managed per Morna, NP  Hypotension Hypotension with blood pressure at 78/63. Oncology team aware. Patient receiving IVF per Morna, NP.   Wound care Self-managing wound care with ABD pads. Requires additional supplies. - Order additional ABD pads. -Multiple attempts made by Maygan, RN to assist team with identifying homehealth agencies for support. Patient has been declined by multiple agencies to include [facility] for specifics to home health.   I will plan to see patient back in 1 week.   Patient expressed understanding and was in agreement with this plan. She also understands that She can call the clinic at any time with any questions, concerns, or complaints.     Any controlled substances utilized were prescribed in the context of palliative care. PDMP has been reviewed.    Visit consisted of counseling and education dealing with the complex and emotionally intense issues of symptom management and palliative care in the setting of serious and potentially life-threatening illness.  Levon Borer, AGPCNP-BC  Palliative Medicine Team/[facility]

## 2024-02-01 NOTE — Plan of Care (Signed)
 Hospital Medicine Transfer Accept Note Patient Name/Age: Chelsea Hansen / 53 y.o. MRN: [id number] Admission Date: (Not on file)  Once successfully transferred to the appropriate floor, TRH will assume care for the patient above.  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 Pt re-presenting for eval iso ongoing FTT and lack of nutrition. Will need IVF, anti-emetics, pain meds, and GI eval on admission.

## 2024-02-01 NOTE — Hospital Course (Addendum)
 IMPRESSION: 1. Known pancreatic head mass is not well visualized on this examination. Progressive extrahepatic biliary ductal dilation with abrupt cut off in the region of the pancreatic head in the expected location of the pancreatic head mass better seen on MRI examination of 12/23/2023. 2. Progressive, moderate right hydronephrosis and proximal hydroureter to the level of the mid ureter. No intrarenal or ureteral calculi are identified and the findings may relate to an underlying intrinsic stricture. 3. Progressive peritoneal carcinomatosis. 4. Interval development of mild ascites. 5. Percutaneous jejunostomy catheter is in place, however, the retaining balloon has been further withdrawn and is now within the tract within the anterior abdominal wall. 6. Stable osseous metastatic disease with pathologic fracture of the L3 vertebral body with mild loss of height. 7. Pancolonic diverticulosis without superimposed acute inflammatory change.

## 2024-02-01 NOTE — H&P (Addendum)
 [facility] HPI   Chelsea Hansen FMW:[id number] DOB: Mar 14, 1971 DOA: 02/01/2024 From: Home code Status full  PCP: Chelsea Basket, MD   Chief Complaint: Multiple  HPI:  53 year old black female Breast cancer metastasized to bone on anastrozole  Verzenio  Pancreatic mass with duodenal stricture/SBO with ileus recently diagnosed on admission 7/3 through 01/17/2024 At that admission patient underwent EUS 12/26/2023 unsuccessful secondary to duodenal stenosis-repeat attempt 7/9 unsuccessful-General Surgery evaluated underwent gastrojejunal bypass 7/14 and no metastatic disease Had an upper GI on 7/18 Patient underwent celiac plexus block and the original plan was to consider another EGD on 01/18/2024 despite general surgery recommending G-tube decompression if nauseated and to continue tube feeds via J-tube-she was on TPN at the time and this was discontinued and she went home and PICC line was removed  Looks like the drain was exchanged on 8/1 and injection manipulation of the Perc jejunostomy was performed at the time by IR yesterday/goresuch because I do not have today's labs suggest that lab, just hold abnormalities with IV until we can get labs to follow-up  During that hospitalization she had lap creation of gastrojejunostomy and followed with Dr. Lyndel on 8/8 with discomfort irritation around the tube site with gauze and had not started tube feeds  8/12 was seen by nurse practitioner with palliative care Mrs. Chelsea Hansen with significant abdominal pain nausea despite oxycodone  7.5 every 4 as needed-gabapentin  was increased to iii/day Thought process was that patient should get into see GI sooner to establish care She was also found to be hypotensive  Lab values from 8/12 visit showed sodium 130 potassium 2.6 BUN/creatinine 11/0.9 calcium  6.3 magnesium  1.6 AST ALT 43/62 WBC 4.1 hemoglobin 8.4 platelet 176  She is eating about 5 to 10% of meals regular food only and not really  able to pass stool when h has had no stool for the past week despite 2 enemas She also states that she is having some blurred vision at times-she is not having any chest pain or fever or chills or nausea or currently and has been eating food and asked for diet  Review of Systems:  As mentioned above in HPI are pertinent +'s Pertinent negatives as per below   ED Course: Was direct admitted  Past Medical History:  Diagnosis Date   Allergy    seasonal allergies   Anxiety    on meds   Asthma    uses inhaler   Breast cancer (HCC) 2012   RIGHT lumpectomy   Cancer [facility]) 2022   RIGHT breast lump-dx 2022   Depression    on meds   DVT (deep venous thrombosis) (HCC) 2010   after hysterectomy   Family history of uterine cancer    GERD (gastroesophageal reflux disease)    with certain foods/OTC PRN meds   Headache(784.0)    History of radiation therapy    Bilateral breast- 09/03/21-11/02/21- Dr. Lynwood Nasuti   Hyperlipidemia    on meds   Hypertension    on meds   SVT (supraventricular tachycardia) [facility])    Past Surgical History:  Procedure Laterality Date   ABDOMINAL HYSTERECTOMY     AXILLARY SENTINEL NODE BIOPSY Left 07/09/2021   Procedure: LEFT AXILLARY SENTINEL NODE BIOPSY;  Surgeon: Chelsea Berg, MD;  Location: [facility];  Service: General;  Laterality: Left;   BALLOON DILATION N/A 12/13/2023   Procedure: MERRILL HODGKIN;  Surgeon: Chelsea Rosario BROCKS, MD;  Location: [facility];  Service: Gastroenterology;  Laterality: N/A;   BIOPSY  06/29/2022  Procedure: BIOPSY;  Surgeon: Chelsea Victory LITTIE DOUGLAS, MD;  Location: [facility];  Service: Gastroenterology;;   BIOPSY OF SKIN SUBCUTANEOUS TISSUE AND/OR MUCOUS MEMBRANE  12/13/2023   Procedure: BIOPSY, SKIN, SUBCUTANEOUS TISSUE, OR MUCOUS MEMBRANE;  Surgeon: Chelsea Rosario BROCKS, MD;  Location: [facility];  Service: Gastroenterology;;   BREAST EXCISIONAL BIOPSY Right 09/16/2009   BREAST LUMPECTOMY WITH RADIOACTIVE SEED  LOCALIZATION Bilateral 07/09/2021   Procedure: BILATERAL BREAST LUMPECTOMY WITH RADIOACTIVE SEED LOCALIZATION;  Surgeon: Chelsea Berg, MD;  Location: [facility];  Service: General;  Laterality: Bilateral;   BREAST SURGERY     lumpectomy   ESOPHAGOGASTRODUODENOSCOPY N/A 12/13/2023   Procedure: EGD (ESOPHAGOGASTRODUODENOSCOPY);  Surgeon: Chelsea Rosario BROCKS, MD;  Location: [facility];  Service: Gastroenterology;  Laterality: N/A;   ESOPHAGOGASTRODUODENOSCOPY N/A 12/26/2023   Procedure: EGD (ESOPHAGOGASTRODUODENOSCOPY);  Surgeon: Chelsea Hansen., MD;  Location: [facility];  Service: Gastroenterology;  Laterality: N/A;   ESOPHAGOGASTRODUODENOSCOPY N/A 12/27/2023   Procedure: EGD (ESOPHAGOGASTRODUODENOSCOPY);  Surgeon: Chelsea Hansen., MD;  Location: [facility];  Service: Gastroenterology;  Laterality: N/A;   ESOPHAGOGASTRODUODENOSCOPY (EGD) WITH PROPOFOL  N/A 06/29/2022   Procedure: ESOPHAGOGASTRODUODENOSCOPY (EGD) WITH PROPOFOL ;  Surgeon: Chelsea Victory LITTIE DOUGLAS, MD;  Location: [facility];  Service: Gastroenterology;  Laterality: N/A;   EUS N/A 12/26/2023   Procedure: ULTRASOUND, UPPER GI TRACT, ENDOSCOPIC;  Surgeon: Chelsea Hansen., MD;  Location: [facility];  Service: Gastroenterology;  Laterality: N/A;   EUS N/A 12/27/2023   Procedure: ULTRASOUND, UPPER GI TRACT, ENDOSCOPIC;  Surgeon: Chelsea Hansen., MD;  Location: [facility];  Service: Gastroenterology;  Laterality: N/A;   IR CM INJ ANY COLONIC TUBE W/FLUORO  01/20/2024   LAPAROSCOPIC INSERTION GASTROSTOMY TUBE N/A 01/10/2024   Procedure: laparoscopic insertion of gastrostomy tube, laparoscopic insertion of jejunostomy tube, upper Hansen;  Surgeon: Kinsinger, Herlene Righter, MD;  Location: [facility];  Service: General;  Laterality: N/A;  Laproscopic insertion   LAPAROSCOPIC ROUX-EN-Y GASTRIC BYPASS WITH HIATAL HERNIA REPAIR N/A 01/02/2024   Procedure: LAPAROSCOPIC CREATION GASTRJEJONOSTOMY;  Surgeon:  Chelsea Hansen Deward PARAS, MD;  Location: [facility];  Service: General;  Laterality: N/A;  GASTROJEJUNOSTOMY   PORT-A-CATH REMOVAL Left 07/09/2021   Procedure: REMOVAL PORT-A-CATH;  Surgeon: Chelsea Berg, MD;  Location: [facility];  Service: General;  Laterality: Left;   PORTACATH PLACEMENT Left 01/26/2021   Procedure: INSERTION PORT-A-CATH;  Surgeon: Chelsea Berg, MD;  Location: [facility];  Service: General;  Laterality: Left;   RADIOACTIVE SEED GUIDED AXILLARY SENTINEL LYMPH NODE Right 07/09/2021   Procedure: RADIOACTIVE SEED GUIDED RIGHT AXILLARY SENTINEL LYMPH NODE DISSECTION;  Surgeon: Chelsea Berg, MD;  Location: [facility];  Service: General;  Laterality: Right;   TUBAL LIGATION      reports that she has never smoked. She has never used smokeless tobacco. She reports that she does not currently use alcohol after a past usage of about 7.0 standard drinks of alcohol per week. She reports that she does not use drugs.  Mobility: Independent  No Known Allergies Family History  Problem Relation Age of Onset   Breast cancer Mother        38s   Hypertension Father    Uterine cancer Maternal Aunt    Ovarian cancer Maternal Aunt    Breast cancer Maternal Aunt        84s   Breast cancer Cousin 71   Uterine cancer Cousin 69   Colon polyps Neg Hx    Colon cancer Neg Hx    Esophageal cancer Neg  Hx    Stomach cancer Neg Hx    Prior to Admission medications   Medication Sig Start Date End Date Taking? Authorizing Provider  abemaciclib  (VERZENIO ) 50 MG tablet Take 50 mg by mouth 2 (two) times daily. Patient not taking: Reported on 01/25/2024    [provider]  anastrozole  (ARIMIDEX ) 1 MG tablet Take 1 tablet (1 mg total) by mouth daily. Patient not taking: Reported on 01/25/2024 04/18/23   [name], NP  calcitRIOL  (ROCALTROL ) 0.5 MCG capsule Take 1 capsule (0.5 mcg total) by mouth daily. Patient not taking: Reported on 01/25/2024  12/15/23   [name], [name], MD  Calcium  Carb-Cholecalciferol  600-10 MG-MCG TABS Take 1 tablet by mouth 3 (three) times daily. Patient not taking: Reported on 01/25/2024 12/14/23 01/13/24  [name], [name], MD  gabapentin  (NEURONTIN ) 300 MG capsule Take 1 capsule (300 mg total) by mouth 3 (three) times daily. 05/26/23   [name], [name], MD  oxyCODONE -acetaminophen  (PERCOCET) 7.5-325 MG tablet Take 1 tablet by mouth every 4 (four) hours as needed for severe pain (pain score 7-10) (Take 1-2 tablets by mouth every 6 (six) hours as needed for severe pain (pain score 7-10)). Max daily dose 8 tablets per provider. 02/04/24   [name], Athena N, NP  pantoprazole  (PROTONIX ) 40 MG tablet Take 1 tablet (40 mg total) by mouth 2 (two) times daily. 01/25/24   [name], NP  polyethylene glycol powder (GLYCOLAX /MIRALAX ) 17 GM/SCOOP powder Mix 17 g in 4 oz of liquid and take by mouth 2 (two) times daily as needed. 11/30/23   [name], NP  prochlorperazine  (COMPAZINE ) 10 MG tablet Take 1 tablet (10 mg total) by mouth every 6 (six) hours as needed for nausea or vomiting. 01/25/24   [name], NP  propranolol  (INDERAL ) 10 MG tablet Take 1 tablet (10 mg total) by mouth 2 (two) times daily. Please call [phone] to schedule an appointment for future refills. Thank you. 3rd attempt. Patient not taking: Reported on 01/25/2024 06/01/23   [name], [name], DO  scopolamine  (TRANSDERM-SCOP) 1 MG/3DAYS Place 1 patch (1.5 mg total) onto the skin behind ear every 3 (three) days as needed for dizziness, nausea or vomiting. 01/31/24   [name], [name], NP  senna (SENOKOT) 8.6 MG TABS tablet Take 2 tablets (17.2 mg total) by mouth 2 (two) times daily. 01/31/24   [name], [name], NP    Physical Exam:  Vitals:   02/01/24 1611  BP: 92/67  Pulse: 95  Resp: 18  Temp: 98.1 F (36.7 C)  SpO2: 100%    Awake coherent tired appearing black female no distress No icterus no  pallor Chest is clear no wheeze rales rhonchi S1-S2 no murmur Abdomen is slightly distended I looked underneath the bandages and she has excoriation around the areas of the wounds They seem to be draining okay We deferred genitourinary rectal exam Trace lower extremity edema  I have personally reviewed following labs and imaging studies  Labs form 8/12 Sodium 130 potassium 2.6 CO2 34 chloride 83 calcium  6.3 magnesium  1.6 AST/ALT 43/62 Alk phos 642 WBC 4.1 hemoglobin 8.4 platelet 176  Imaging studies:  None performed  Medical tests:  EKG independently reviewed: None performed  Test discussed with performing physician: Discussed with surgeon Dr. Gearlean who will come and see  Decision to obtain old records:  Yes  Review and summation of old records:  Yes  Principal Problem:   SBO (small bowel obstruction) (HCC)   Assessment/Plan Malfunction of G-tube/J-tube with  possible ileus/SBO-no stool in a week but passing some gas Obtain stat CT scan abdomen pelvis to delineate what is going on although she does not  seem obstructed to me at this time--she is actually hungry and wants to eat and as she has not had any nausea or vomiting I think that is quite reasonable We will follow the results of the same and determine the next steps with general surgery--- to me she does not seem obstructed and may be simply not be eating enough to have regular stools I would hold further enemas etc. 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 as per general surgery-I will add Dr. Viona to the treatment team She needs optimization of bowel healing to facilitate the biopsy according to last oncology notes and we will try to get this managed here  Underlying prior history of breast cancer with mets to the bone   Severity of Illness: The appropriate patient status for this patient is INPATIENT. Inpatient status is judged to be reasonable and necessary in order to provide the required intensity of service to ensure the patient's safety. The patient's presenting symptoms, physical exam findings, and initial radiographic and laboratory data in the context of their chronic comorbidities is felt to place them at high risk for further clinical deterioration. Furthermore, it is not anticipated that the patient will be medically stable for discharge from the hospital within 2 midnights of admission.   * I certify that at the point of admission it is my clinical judgment that the patient will require inpatient hospital care spanning beyond 2 midnights from the point of admission due to high intensity of service, high risk for further deterioration and high frequency of surveillance required.*   Family Communication: None present  DVT ppx: Heparin  Consults called & Whom: General surgery  Time spent: 55 minutes  Royal, MD [days-call my NP partners at night for Care related issues] [facility] --[employer] [employer] OR , [email or web address]; password [facility]  02/01/2024, 5:09 PM

## 2024-02-01 NOTE — Consult Note (Signed)
 Reason for Consult:  Post-op patient with failure to thrive Referring Provider: Royal, MD  HPI  Chelsea Hansen is an 53 y.o. female with complex medical and surgical history. Briefly, patient has metastatic breast cancer to bone. 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 Ultimately underwent GJ bypass on 7/14 with Dr. Valentine. Then underwent Gtube and Jtube placement with Dr. Stevie on 7/22. Hospital course complicated by intolerance of Jtube feeds, need for TPN. Eventually left hospital AMA. Had represented to ED on 7/31 with Gtube drainage issues and found to have Gtube dislodged in subcutaneous tissues. This was replaced by IR on 8/1.  Patient seen by Dr. Valentine in clinic on 8/8 with discomfort and irritation around gtube drain site. There was difficulty tolerating adequate nutrition. Hesitancy to start tube feeds outpatient for multiple reasons, including ability to tolerate as well as high risk for refeeding syndrome and needs close monitoring for electrolyte abnormalities.  CT scan obtained recently this AM showed progressive peritoneal carcinomatosis, interval development of mild ascites, progressive extrahepatic biliary ductal dilatation, progressive moderate right hydronephrosis without obvious calculi, retracted retention balloon of jejunostomy tube, developing encasement of IVC.  Patient states she was tolerating PO at home in small amounts and was hungry but developed nausea and emesis this AM after CT scan. Active emesis on my evaluation. Her Gtube and Jtube are leaking, Gtube quite significantly, it appears the abdominal wall tract is larger than the diameter of the tube.  10 point review of systems is negative except as listed above in HPI.  Objective  Past Medical History: Past Medical History:  Diagnosis Date   Allergy    seasonal allergies   Anxiety    on meds   Asthma    uses  inhaler   Breast cancer (HCC) 2012   RIGHT lumpectomy   Cancer (HCC) 2022   RIGHT breast lump-dx 2022   Depression    on meds   DVT (deep venous thrombosis) (HCC) 2010   after hysterectomy   Family history of uterine cancer    GERD (gastroesophageal reflux disease)    with certain foods/OTC PRN meds   Headache(784.0)    History of radiation therapy    Bilateral breast- 09/03/21-11/02/21- Dr. Lynwood Nasuti   Hyperlipidemia    on meds   Hypertension    on meds   SVT (supraventricular tachycardia) (HCC)     Past Surgical History: Past Surgical History:  Procedure Laterality Date   ABDOMINAL HYSTERECTOMY     AXILLARY SENTINEL NODE BIOPSY Left 07/09/2021   Procedure: LEFT AXILLARY SENTINEL NODE BIOPSY;  Surgeon: [name], MD;  Location: [facility];  Service: General;  Laterality: Left;   BALLOON DILATION N/A 12/13/2023   Procedure: MERRILL HODGKIN;  Surgeon: Federico Rosario BROCKS, MD;  [facility];  Service: Gastroenterology;  Laterality: N/A;   BIOPSY  06/29/2022   Procedure: BIOPSY;  Surgeon: Legrand Victory LITTIE DOUGLAS, MD;  Location: [facility];  Service: Gastroenterology;;   BIOPSY OF SKIN SUBCUTANEOUS TISSUE AND/OR MUCOUS MEMBRANE  12/13/2023   Procedure: BIOPSY, SKIN, SUBCUTANEOUS TISSUE, OR MUCOUS MEMBRANE;  Surgeon: Federico Rosario BROCKS, MD;  Location: [facility];  Service: Gastroenterology;;   BREAST EXCISIONAL BIOPSY Right 09/16/2009   BREAST LUMPECTOMY WITH RADIOACTIVE SEED LOCALIZATION Bilateral 07/09/2021   Procedure: BILATERAL BREAST LUMPECTOMY WITH RADIOACTIVE SEED LOCALIZATION;  Surgeon: [name], MD;  Location: [facility];  Service: General;  Laterality: Bilateral;  BREAST SURGERY     lumpectomy   ESOPHAGOGASTRODUODENOSCOPY N/A 12/13/2023   Procedure: EGD (ESOPHAGOGASTRODUODENOSCOPY);  Surgeon: Federico Rosario BROCKS, MD;  Location: [facility];  Service: Gastroenterology;  Laterality: N/A;   ESOPHAGOGASTRODUODENOSCOPY N/A  12/26/2023   Procedure: EGD (ESOPHAGOGASTRODUODENOSCOPY);  Surgeon: Wilhelmenia Aloha Raddle., MD;  Location: [facility];  Service: Gastroenterology;  Laterality: N/A;   ESOPHAGOGASTRODUODENOSCOPY N/A 12/27/2023   Procedure: EGD (ESOPHAGOGASTRODUODENOSCOPY);  Surgeon: Wilhelmenia Aloha Raddle., MD;  Location: [facility];  Service: Gastroenterology;  Laterality: N/A;   ESOPHAGOGASTRODUODENOSCOPY (EGD) WITH PROPOFOL  N/A 06/29/2022   Procedure: ESOPHAGOGASTRODUODENOSCOPY (EGD) WITH PROPOFOL ;  Surgeon: Legrand Victory LITTIE DOUGLAS, MD;  Location: [facility];  Service: Gastroenterology;  Laterality: N/A;   EUS N/A 12/26/2023   Procedure: ULTRASOUND, UPPER GI TRACT, ENDOSCOPIC;  Surgeon: Wilhelmenia Aloha Raddle., MD;  Location: [facility];  Service: Gastroenterology;  Laterality: N/A;   EUS N/A 12/27/2023   Procedure: ULTRASOUND, UPPER GI TRACT, ENDOSCOPIC;  Surgeon: Wilhelmenia Aloha Raddle., MD;  Location: [facility];  Service: Gastroenterology;  Laterality: N/A;   IR CM INJ ANY COLONIC TUBE W/FLUORO  01/20/2024   LAPAROSCOPIC INSERTION GASTROSTOMY TUBE N/A 01/10/2024   Procedure: laparoscopic insertion of gastrostomy tube, laparoscopic insertion of jejunostomy tube, upper endoscopy;  Surgeon: Kinsinger, Herlene Righter, MD;  Location: [facility];  Service: General;  Laterality: N/A;  Laproscopic insertion   LAPAROSCOPIC ROUX-EN-Y GASTRIC BYPASS WITH HIATAL HERNIA REPAIR N/A 01/02/2024   Procedure: LAPAROSCOPIC CREATION GASTRJEJONOSTOMY;  Surgeon: Lyndel Deward PARAS, MD;  Location: [facility];  Service: General;  Laterality: N/A;  GASTROJEJUNOSTOMY   PORT-A-CATH REMOVAL Left 07/09/2021   Procedure: REMOVAL PORT-A-CATH;  Surgeon: [name], MD;  Location: [facility];  Service: General;  Laterality: Left;   PORTACATH PLACEMENT Left 01/26/2021   Procedure: INSERTION PORT-A-CATH;  Surgeon: [name], MD;  Location: [facility];  Service: General;  Laterality: Left;   RADIOACTIVE SEED GUIDED AXILLARY SENTINEL  LYMPH NODE Right 07/09/2021   Procedure: RADIOACTIVE SEED GUIDED RIGHT AXILLARY SENTINEL LYMPH NODE DISSECTION;  Surgeon: [name], MD;  Location: [facility];  Service: General;  Laterality: Right;   TUBAL LIGATION      Family History:  Family History  Problem Relation Age of Onset   Breast cancer Mother        7s   Hypertension Father    Uterine cancer Maternal Aunt    Ovarian cancer Maternal Aunt    Breast cancer Maternal Aunt        53s   Breast cancer Cousin 37   Uterine cancer Cousin 50   Colon polyps Neg Hx    Colon cancer Neg Hx    Esophageal cancer Neg Hx    Stomach cancer Neg Hx     Social History:  reports that she has never smoked. She has never used smokeless tobacco. She reports that she does not currently use alcohol after a past usage of about 7.0 standard drinks of alcohol per week. She reports that she does not use drugs.  Allergies: No Known Allergies  Medications: I have reviewed the patient's current medications.  Labs: I have personally reviewed all labs for the past 24h  Imaging: I have personally reviewed and interpreted all imaging for the past 24h and agree with the radiologist's impression.  No results found.   Physical Exam Blood pressure 92/67, pulse 95, temperature 98.1 F (36.7 C), temperature source Oral, resp. rate 18, SpO2 100%. General: mild distress due to nausea and emesis HEENT: normocephalic, atraumatic Oropharynx: mucous membranes  moist CV: Regular rate and rhythm, nomotensive Chest: equal chest rise bilaterally normal respiratory effort on room air Abdomen: soft, mildly tender to palpation bilaterally, slightly worse on left. Leakage of gastric contents around Gtube with surrounding excoriation. Mild leakage around Jtube of thin fluid, most appears to be rundown from Gtube contents Extremities: moves all extremities Skin: warm, dry, no rashes Psych: normal memory, normal mood/affect  Neuro: No focal  neurologic deficits, A&Ox3    Assessment   Chelsea Hansen is an 53 y.o. female with complex medical and surgical history admitted for electrolyte derangements, failure to thrive and possible G and/or Jtube malfunction/malposition  Plan  - IR consult order placed to evaluate for repositioning of Jtube as well as see if any larger Gtube options available for upsizing. - Recommend close serial monitoring of electrolytes, be sure to include magnesium  and phosphorus checks in addition to sodium and potassium  - Close and continued correction of any electrolyte abnormalities - Keep NPO while awaiting possible radiology interventions - I will discuss with Dr. Valentine later this morning. Complex patient with progression of disease. Will need multidisciplinary approach continued with general surgery, palliative care, oncology, and TRH. Once more immediate issue of tube malpositioning addressed then will discuss not only timing of starting tube feeds but also overall prognosis and goals of care as well as all possible options - DVT - SCDs, ok for DVT ppx from general surgery perspective  I reviewed hospitalist notes, last 24 h vitals and pain scores, last 48 h intake and output, last 24 h labs and trends, last 24 h imaging results, and recent general surgery clinic notes from 8/8 with Dr. Valentine.  This care required high  level of medical decision making.   Orie Silversmith, MD [facility]

## 2024-02-02 ENCOUNTER — Other Ambulatory Visit: Payer: Self-pay

## 2024-02-02 ENCOUNTER — Encounter (HOSPITAL_COMMUNITY): Payer: Self-pay

## 2024-02-02 ENCOUNTER — Other Ambulatory Visit (HOSPITAL_COMMUNITY): Payer: MEDICAID

## 2024-02-02 ENCOUNTER — Inpatient Hospital Stay (HOSPITAL_COMMUNITY): Payer: MEDICAID

## 2024-02-02 ENCOUNTER — Encounter (HOSPITAL_COMMUNITY): Payer: Self-pay | Admitting: Family Medicine

## 2024-02-02 DIAGNOSIS — K56609 Unspecified intestinal obstruction, unspecified as to partial versus complete obstruction: Secondary | ICD-10-CM

## 2024-02-02 LAB — HEMOGLOBIN AND HEMATOCRIT, BLOOD
HCT: 25.2 % — ABNORMAL LOW (ref 36.0–46.0)
Hemoglobin: 7.9 g/dL — ABNORMAL LOW (ref 12.0–15.0)

## 2024-02-02 LAB — COMPREHENSIVE METABOLIC PANEL WITH GFR
ALT: 74 U/L — ABNORMAL HIGH (ref 0–44)
AST: 74 U/L — ABNORMAL HIGH (ref 15–41)
Albumin: 2.3 g/dL — ABNORMAL LOW (ref 3.5–5.0)
Alkaline Phosphatase: 518 U/L — ABNORMAL HIGH (ref 38–126)
Anion gap: 7 (ref 5–15)
BUN: 5 mg/dL — ABNORMAL LOW (ref 6–20)
CO2: 25 mmol/L (ref 22–32)
Calcium: 6.9 mg/dL — ABNORMAL LOW (ref 8.9–10.3)
Chloride: 107 mmol/L (ref 98–111)
Creatinine, Ser: 0.61 mg/dL (ref 0.44–1.00)
GFR, Estimated: >60 mL/min (ref >60)
Glucose, Bld: 94 mg/dL (ref 70–99)
Potassium: 3.7 mmol/L (ref 3.5–5.1)
Sodium: 139 mmol/L (ref 135–145)
Total Bilirubin: 0.8 mg/dL (ref 0.0–1.2)
Total Protein: 5.4 g/dL — ABNORMAL LOW (ref 6.5–8.1)

## 2024-02-02 LAB — CBC
HCT: 21.6 % — ABNORMAL LOW (ref 36.0–46.0)
Hemoglobin: 6.7 g/dL — CL (ref 12.0–15.0)
MCH: 23.5 pg — ABNORMAL LOW (ref 26.0–34.0)
MCHC: 31 g/dL (ref 30.0–36.0)
MCV: 75.8 fL — ABNORMAL LOW (ref 80.0–100.0)
Platelets: 146 K/uL — ABNORMAL LOW (ref 150–400)
RBC: 2.85 MIL/uL — ABNORMAL LOW (ref 3.87–5.11)
RDW: 17.8 % — ABNORMAL HIGH (ref 11.5–15.5)
WBC: 4 K/uL (ref 4.0–10.5)
nRBC: 0.5 % — ABNORMAL HIGH (ref 0.0–0.2)

## 2024-02-02 LAB — TRIGLYCERIDES: Triglycerides: 105 mg/dL

## 2024-02-02 LAB — PREPARE RBC (CROSSMATCH)

## 2024-02-02 LAB — PHOSPHORUS: Phosphorus: 2.8 mg/dL (ref 2.5–4.6)

## 2024-02-02 LAB — PROTIME-INR
INR: 1.2 (ref 0.8–1.2)
Prothrombin Time: 15.8 s — ABNORMAL HIGH (ref 11.4–15.2)

## 2024-02-02 LAB — MAGNESIUM: Magnesium: 1.9 mg/dL (ref 1.7–2.4)

## 2024-02-02 MED ORDER — PROCHLORPERAZINE EDISYLATE 10 MG/2ML IJ SOLN
10.0000 mg | Freq: Once | INTRAMUSCULAR | Status: AC
  Administered 2024-02-02: 10 mg via INTRAVENOUS
  Filled 2024-02-02: qty 2

## 2024-02-02 MED ORDER — POLYETHYLENE GLYCOL 3350 17 G PO PACK: 17.0000 g | PACK | Freq: Two times a day (BID) | ORAL | Status: DC

## 2024-02-02 MED ORDER — ACETAMINOPHEN 325 MG PO TABS
650.0000 mg | ORAL_TABLET | Freq: Once | ORAL | Status: AC
  Administered 2024-02-04: 650 mg
  Filled 2024-02-02: qty 2

## 2024-02-02 MED ORDER — SODIUM CHLORIDE 0.9% FLUSH: 10.0000 mL | INTRAVENOUS | Status: DC | PRN

## 2024-02-02 MED ORDER — CHLORHEXIDINE GLUCONATE CLOTH 2 % EX PADS
6.0000 | MEDICATED_PAD | Freq: Every day | CUTANEOUS | Status: DC
  Administered 2024-02-02 – 2024-02-06 (×5): 6 via TOPICAL

## 2024-02-02 MED ORDER — CALCIUM GLUCONATE-NACL 1-0.675 GM/50ML-% IV SOLN
1.0000 g | Freq: Once | INTRAVENOUS | Status: AC
  Administered 2024-02-02: 1000 mg via INTRAVENOUS
  Filled 2024-02-02: qty 50

## 2024-02-02 MED ORDER — TRAVASOL 10 % IV SOLN
INTRAVENOUS | Status: AC
  Filled 2024-02-02: qty 352.8

## 2024-02-02 MED ORDER — MAGNESIUM SULFATE 2 GM/50ML IV SOLN
2.0000 g | Freq: Once | INTRAVENOUS | Status: AC
  Administered 2024-02-02: 2 g via INTRAVENOUS
  Filled 2024-02-02: qty 50

## 2024-02-02 MED ORDER — FUROSEMIDE 10 MG/ML IJ SOLN
20.0000 mg | Freq: Once | INTRAMUSCULAR | Status: AC
  Administered 2024-02-02: 20 mg via INTRAVENOUS
  Filled 2024-02-02: qty 2

## 2024-02-02 MED ORDER — POTASSIUM CHLORIDE 10 MEQ/100ML IV SOLN
10.0000 meq | Freq: Once | INTRAVENOUS | Status: AC
  Administered 2024-02-02: 10 meq via INTRAVENOUS
  Filled 2024-02-02: qty 100

## 2024-02-02 MED ORDER — POTASSIUM CHLORIDE IN NACL 40-0.9 MEQ/L-% IV SOLN
INTRAVENOUS | Status: AC
  Filled 2024-02-02 (×2): qty 1000

## 2024-02-02 MED ORDER — SODIUM CHLORIDE 0.9% IV SOLUTION: Freq: Once | INTRAVENOUS | Status: DC

## 2024-02-02 MED ORDER — LIDOCAINE-EPINEPHRINE 1 %-1:100000 IJ SOLN
INTRAMUSCULAR | Status: AC
  Filled 2024-02-02: qty 2

## 2024-02-02 MED ORDER — LIDOCAINE VISCOUS HCL 2 % MT SOLN
OROMUCOSAL | Status: AC
  Filled 2024-02-02: qty 15

## 2024-02-02 MED ORDER — VITAL 1.5 CAL PO LIQD
1000.0000 mL | ORAL | Status: DC
  Filled 2024-02-02: qty 1000

## 2024-02-02 MED ORDER — POTASSIUM PHOSPHATES 15 MMOLE/5ML IV SOLN
15.0000 mmol | Freq: Once | INTRAVENOUS | Status: AC
  Administered 2024-02-02: 15 mmol via INTRAVENOUS
  Filled 2024-02-02: qty 5

## 2024-02-02 MED ORDER — IOHEXOL 350 MG/ML SOLN
75.0000 mL | Freq: Once | INTRAVENOUS | Status: AC | PRN
  Administered 2024-02-02: 75 mL via INTRAVENOUS

## 2024-02-02 MED ORDER — DIPHENHYDRAMINE HCL 12.5 MG/5ML PO ELIX: 25.0000 mg | ORAL_SOLUTION | Freq: Once | ORAL | Status: DC

## 2024-02-02 NOTE — Progress Notes (Addendum)
 Nutrition Follow-up  DOCUMENTATION CODES:   Severe malnutrition in context of chronic illness  INTERVENTION:  TPN per pharmacy Pt is at severe refeeding risk, titrate TPN to goal of: Kcal: 2000-2300 kcal Protein: 100-115 gm protein  Fluid: >/=2L/day  Add 100 mg Thiamine  and MVI to TPN Per pharmacy: Start TPN at 35mL/hr at 1800  Goal TPN rate is 90 mL/hr (provides 105 g of protein and 2185 kcals per day)   Once J-tube is adjusted by IR and J-tube is safe to use recommend:  Vital 1.5 @ 10 ml/hr with goal of 60 ml/hr (1440 ml per day) Prosource TF20 60 ml daily  At goal provides 2,240 kcal, 117 gm protein,1100 ml water   *If pt cannot tolerate tube feeds at goal and if tubes keep being mispositioned may need long term TPN if within GOC.   NUTRITION DIAGNOSIS:   Severe Malnutrition related to cancer and cancer related treatments as evidenced by severe muscle depletion, energy intake < or equal to 75% for > or equal to 1 month, moderate muscle depletion.  GOAL:   Patient will meet greater than or equal to 90% of their needs   MONITOR:   PO intake, Supplement acceptance, Diet advancement, Labs, TF tolerance  REASON FOR ASSESSMENT:   Consult Assessment of nutrition requirement/status, Enteral/tube feeding initiation and management, New TPN/TNA  ASSESSMENT: 53 y.o. female with complex medical and surgical history. Briefly, patient has metastatic breast cancer to bone. 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 Ultimately underwent GJ bypass on 7/14.Then underwent Gtube and Jtube placement on 7/22. Hospital course complicated by intolerance of Jtube feeds, need for TPN. Eventually left hospital AMA. . Admitted with malfunction G-tube/Jtube with possible ilesu/SBO.Worsening cancer, electolyte abmoralitites, Nausea, vomiting, abdominal pain.  Prior Admission: 7/3: admitted, clear liquids 7/5: FLD 7/6:  CLD 7/7: NPO, s/p EUS 7/8: CLD, s/p EGD 7/9: TPN initiated 7/10: NPO 7/11: NGT placed for LIS 7/12: TPN advanced to goal 7/14: s/p gastrojejonostomy 7/15: FLD 7/22: s/p G-tube and J-tube placement; CLD; TF's initiated @ 20 7/23 TF stopped overnight for emesis 7/24 TF stopped overnight for emesis 7/25 TF stopped overnight for emesis; Surgery put in to start TF at 15mL/hr but patient refused 7/26 TF to be held indefinitely 7/28 TPN to increase back to goal to meet nutritional needs 7/29 -Pt left AMA  Outpatient: 8/1: Gtube dislodged, replaced by IR  8/8: Discomfort and irritation around gtube drain site.  Current admission: 8/13: Admitted  8/14: CT scan showed progressive peritoneal carcinomatosis,  mild ascites, progressive extrahepatic biliary ductal dilatation, progressive moderate right hydronephrosis, retracted retention balloon of jejunostomy tube, developing encasement of IVC. TPN start at 80mL/hr   Pt just woke up on visit, saw pt in conjunction with surgeon. Pt on phone with her daughter, both were frustrated at situation at hand. Pt has not been able to utilize her J-tube for feeds since leaving AMA on 01/19/2204 as it was malfunctioning. Pt has been able to tolerate soft foods such as ramen noodles soups, and liquids. Some food has been coming out of her G-tube. Pt does not remember if she has been losing weight but has been getting weaker. Exam limited due to pt's agitation and mild edema however pt with severe muscle and moderate fat depletions. Weight has been trending down however pt with ascites and edema that is contributing to weight gain. Per H&P, Has not been able to pass stool when he has had no  stool for the past week despite 2 enemas.  Pt denies nausea, vomiting and reports she has been tolerating a soft diet. Daughter and pt states she has been eating very well and eating 50% of her meals. However, the chart indicates pt was having trouble tolerating adequate nutrition  and only eating 5-10% of her meals. The chart also indicates pt does have issues with nausea and vomiting, had an episode of emesis this morning after CT. Pt does have an appetite and wants to eat. Had 1/2 of a chicken salad sandwich for dinner yesterday.  IR to adjust J-tube tomorrow. CLD.   Pt and daughter are frustrated why they cannot use the J- tube for feeds and why she has to start TPN again. RD and surgeon explained, even though pt has been able to tolerate some PO, her intake is minimal and she is not absorbing what is being consumed. Pt is severely malnourished with severe electrolyte abnormalities. Once IR adjusts J-tube hopefully will start tube feeds at a low rate. Per MD ok to start on diet after IR.  TPN to run simultaneously as pt may not be able to tolerate tube feeds as in prior admission pt was never able to get to goal. Pt will be at severe refeeding risk. If pt cannot tolerate tube feeds at goal and if tubes keep being mispositioned may need long term TPN if within GOC.   Patient is currently enrolled in [facility] outpatient-based palliative care. Pt wants full aggressive care.   Intake/Output Summary (Last 24 hours) at 02/02/2024 1558 Last data filed at 02/02/2024 1500 Gross per 24 hour  Intake 1734.37 ml  Output --  Net 1734.37 ml   Drains/Lines: G-tube J-tube PICC line  Admit weight: 80 kg - Suspect fluid is contributing to increased weight  Current weight: 80 kg   Average Meal Intake: NPO  Nutritionally Relevant Medications: Scheduled Meds:  sodium chloride    Intravenous Once   acetaminophen   650 mg Per Tube Once   Chlorhexidine  Gluconate Cloth  6 each Topical Daily   diphenhydrAMINE   25 mg Per Tube Once   furosemide   20 mg Intravenous Once   gabapentin   300 mg Oral TID   heparin   5,000 Units Subcutaneous Q8H   pantoprazole   40 mg Oral BID   polyethylene glycol  17 g Oral BID   senna  2 tablet Oral BID   Continuous Infusions:  0.9 % NaCl with KCl 40 mEq  / L     calcium  gluconate 1,000 mg (02/02/24 1506)   potassium PHOSPHATE  IVPB (in mmol) 15 mmol (02/02/24 1508)   TPN ADULT (ION)     Labs Reviewed: Sodium 139 BUN 5 Calcium  6.9 AP 518 Albumin  2.3 AST 74 ALT 74 Total protein 5.4 CBG ranges from 94-126 mg/dL over the last 24 hours No A1C  NUTRITION - FOCUSED PHYSICAL EXAM:  Flowsheet Row Most Recent Value  Orbital Region Mild depletion  Upper Arm Region Mild depletion  Thoracic and Lumbar Region Moderate depletion  Buccal Region Mild depletion  Temple Region Moderate depletion  Clavicle Bone Region Moderate depletion  Clavicle and Acromion Bone Region Moderate depletion  Scapular Bone Region Moderate depletion  Dorsal Hand Moderate depletion  Patellar Region Severe depletion  Anterior Thigh Region Severe depletion  Posterior Calf Region Severe depletion  Edema (RD Assessment) Moderate  Hair Reviewed  [Wig?]  Eyes Reviewed  Mouth Reviewed  Skin Reviewed  Nails Reviewed    Diet Order:   Diet Order  Diet NPO time specified Except for: [employer], Sips with Meds  Diet effective midnight           Diet clear liquid Fluid consistency: Thin  Diet effective now                   EDUCATION NEEDS:   Education needs have been addressed  Skin:  Skin Assessment: Skin Integrity Issues: Skin Integrity Issues:: Stage II, Incisions Stage II: Left and right buttocks Incisions: Surgical incisions   Last BM:  01/28/2024  Height:   Ht Readings from Last 1 Encounters:  02/02/24 5' 11 (1.803 m)    Weight:   Wt Readings from Last 1 Encounters:  02/02/24 80 kg    Ideal Body Weight:  70.5 kg  BMI:  Body mass index is 24.6 kg/m.  Estimated Nutritional Needs:   Kcal:  2000-2300 kcal/day  Protein:  100-115 gm/day  Fluid:  >/=2L   Olivia Kenning, RD Registered Dietitian  See Amion for more information

## 2024-02-02 NOTE — Progress Notes (Signed)
 Talked with Ms. Lorenzo.    Plan is as follows: Start TPN Adjust J tube in radiology After J tube is adjusted, okay for soft diet and trickle J tube feeds  Discussed worsening cancer on CT with patient.  Explained a palliative approach instead of curative may be a better focus. She wants to continue aggressive treatment.  We will work to get her some nutrition, but I'm not sure she will become a candidate for cancer treatment - would need input from oncology team.  Difficult as we have been unable to obtain tissue biopsy.  Deward JINNY Foy, MD General, Bariatric and Minimally Invasive Surgery [facility]

## 2024-02-02 NOTE — TOC Initial Note (Signed)
 Transition of Care [facility]) - Initial/Assessment Note    Patient Details  Name: Chelsea Hansen MRN: [id number] Date of Birth: 1970-08-29  [facility] CM/SW Contact:    Jeoffrey LITTIE Moose, LCSW Phone Number: 02/02/2024, 10:07 AM  Clinical Narrative:                 Pt admitted from home with daughter. No current TOC needs, please consult as needs arise.         Patient Goals and CMS Choice            Expected Discharge Plan and Services                                              Prior Living Arrangements/Services                       Activities of Daily Living   ADL Screening (condition at time of admission) Independently performs ADLs?: No Is the patient deaf or have difficulty hearing?: No Does the patient have difficulty seeing, even when wearing glasses/contacts?: Yes (not on admission) Does the patient have difficulty concentrating, remembering, or making decisions?: No  Permission Sought/Granted                  Emotional Assessment              Admission diagnosis:  SBO (small bowel obstruction) (HCC) [K56.609] Patient Active Problem List   Diagnosis Date Noted   SBO (small bowel obstruction) (HCC) 02/01/2024   Generalized abdominal pain 01/17/2024   Elevated CA 19-9 level 01/16/2024   Hypophosphatemia 01/04/2024   Hypomagnesemia 01/04/2024   Pancreatic lesion 12/29/2023   Unintentional weight loss 12/29/2023   Nausea and vomiting 12/29/2023   Duodenal obstruction 12/29/2023   Abnormal MRI of abdomen 12/26/2023   Abnormal CT of the abdomen 12/23/2023   Elevated liver enzymes 12/23/2023   Acute kidney injury (HCC) 12/23/2023   AKI (acute kidney injury) (HCC) 12/22/2023   Transaminitis 12/22/2023   Pancreatic mass 12/22/2023   Right sided weakness 12/22/2023   Hypotension 12/22/2023   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   Abnormal CT scan, colon 12/14/2023   Acute esophagitis 12/14/2023   Candida esophagitis  (HCC) 12/14/2023   Therapeutic opioid-induced constipation (OIC) 12/11/2023   Hypocalcemia 12/10/2023   Fever of unknown origin 12/10/2023   Sinus tachycardia 12/10/2023   Acute kidney injury superimposed on chronic kidney disease (HCC) 12/05/2023   Electrolyte abnormality 12/05/2023   Dehydration 12/05/2023   Prolonged QT interval 12/05/2023   Intractable nausea and vomiting 11/24/2023   Neoplasm related pain 12/07/2022   Constipation 06/30/2022   Nausea and vomiting in adult 06/29/2022   Hematemesis 06/28/2022   Breast cancer metastasized to bone, right (HCC) 05/20/2022   Supraventricular tachycardia (HCC) 07/08/2021   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   GERD (gastroesophageal reflux disease) 05/27/2021   HLD (hyperlipidemia) 05/27/2021   Essential hypertension 05/27/2021   Pain management contract signed 05/04/2021   Chronic pain syndrome (breast cancer) 05/04/2021   Palpitations 03/25/2021   Sinus bradycardia 03/25/2021   Daytime somnolence 03/25/2021   Snoring 03/25/2021   Obesity (BMI 30-39.9) 03/25/2021   Port-A-Cath in place 01/27/2021   Genetic testing 12/24/2020   Family history of uterine cancer 12/17/2020   Malignant neoplasm of upper-outer quadrant of right breast in female, estrogen  receptor positive (HCC) 12/12/2020   Breast cancer (HCC) 12/12/2020   Hypokalemia 01/02/2012   Chronic asthma 01/01/2012   Bradycardia 01/01/2012   Syncope 12/31/2011   Mediastinal adenopathy 12/31/2011   Anemia 12/31/2011   Chest pain 12/31/2011   PCP:  Georgina Basket, MD Pharmacy:   [facility] [address] [place] [address] Phone: [phone] Fax: [phone]     Social Drivers of Health (SDOH) Social History: SDOH Screenings   Food Insecurity: No Food Insecurity (12/22/2023)  Recent Concern: Food Insecurity - Food Insecurity Present (11/24/2023)  Housing: Low Risk  (12/22/2023)  Transportation Needs: No  Transportation Needs (12/22/2023)  Utilities: Not At Risk (12/22/2023)  Depression (PHQ2-9): Medium Risk (01/25/2024)  Financial Resource Strain: High Risk (01/14/2022)  Physical Activity: Insufficiently Active (08/01/2023)  Stress: Stress Concern Present (10/10/2023)  Tobacco Use: Low Risk  (01/31/2024)  Recent Concern: Tobacco Use - High Risk (01/27/2024)   Received from [facility]   SDOH Interventions:     Readmission Risk Interventions    12/07/2023   12:21 PM 11/26/2023    2:58 PM  Readmission Risk Prevention Plan  Transportation Screening Complete Complete  PCP or Specialist Appt within 3-5 Days  Complete  HRI or Home Care Consult  Complete  Social Work Consult for Recovery Care Planning/Counseling  Complete  Palliative Care Screening  Not Applicable  Medication Review [employer]) Complete Complete  PCP or Specialist appointment within 3-5 days of discharge Complete   HRI or Home Care Consult Complete   SW Recovery Care/Counseling Consult Complete   Palliative Care Screening Not Applicable   Skilled Nursing Facility Not Applicable

## 2024-02-02 NOTE — Progress Notes (Signed)
 Patient refused po medications due to nausea and vomiting.  MD made aware and new orders received

## 2024-02-02 NOTE — Progress Notes (Signed)
 PHARMACY - TOTAL PARENTERAL NUTRITION CONSULT NOTE   Indication: obstructing pancreatic cancer  Patient Measurements: Height: 5' 11 (180.3 cm) Weight: 80 kg (176 lb 6.4 oz) IBW/kg (Calculated) : 70.8 TPN AdjBW (KG): 80 Body mass index is 24.6 kg/m. Usual Weight: 205 lb (93.2 kg)  Assessment: 52 yof with metastatic breast cancer to bone with recent admission from 7/3 > 7/29 where she received TPN before leaving AMA. Also with pancreatic mass with duodenal stricture/SBO with unsuccessful EUS secondary to duodenal stenosis x 2. Underwent GJ bypass on 7/14 then Gtube and Jtube placement on 7/22. She is admitted with malfunction Gtube/Jtube and worsening cancer. She is high risk for refeeding syndrome.  Per RD, patient has been able to tolerate soft foods such as ramen noodles soups, and liquids. Some food has been coming out of her Gtube. Patient does not remember if she has been losing weight but has been getting weaker. There is conflicting information between patient and PO intake charted.  Glucose / Insulin : no hx DM - last admit SSI d/c so will keep off for now Electrolytes: K 3.7, CoCa 8.3 (ordered CaCl 1g & Ca gluc 1g), Phos 2.8, Mg 1.9 Renal: SCr 0.61, BUN 5 Hepatic: TG 105, albumin  2.3, AST/ALT up 74/74, alk Phos down 518 Intake / Output; MIVF: NS+40K at 100 ml/hr, stool x1 8/14  GI Imaging: 7/4 MR abdomen MRCP: pancreatic mass, vertebral metastases, pancolonic diverticulosis 7/10 CT: pancreatic head equivocal fullness, small amount free pelvic fluid, widespread osseous metastatic disease, small hiatal hernia, colonic diverticulosis 8/14: CT scan showed progressive peritoneal carcinomatosis, interval development of mild ascites, progressive extrahepatic biliary ductal dilatation, progressive moderate right hydronephrosis without obvious calculi, retracted retention balloon of jejunostomy tube, developing encasement of IVC.  GI Surgeries / Procedures:  7/8 EGD 7/7 EUS, upper GI  tract 7/14: bypass surgery for creation of G-Junostomy 7/22: Insertion of gastrostomy tube 7/27: CT A/P w/ oral contrast via G/J tube: mildly dilated jejunal loop centrally. Contrast is seen beyond dilated small bowel. may rep ileus or pSBO 7/28 celiac plexus block 7/29 EGD 8/14 Jtube adjustment by IR  Central access: PICC to be placed 02/02/24 TPN start date: 12/28/23 >> 01/17/24, 02/02/24 >>  Nutritional Goals: Goal TPN rate is 90 mL/hr (provides 105 g of protein and 2185 kcals per day)  RD Assessment: Estimated Needs Total Energy Estimated Needs: 2000-2300 kcal/day Total Protein Estimated Needs: 100-115 gm/day Total Fluid Estimated Needs: >/=2L  Current Nutrition:  NPO and TPN  Plan:  Start TPN at 30mL/hr at 1800 Electrolytes in TPN: Na 41mEq/L, K 50mEq/L, Ca 44mEq/L, Mg 2mEq/L, and Phos 15mmol/L. Cl:Ac 1:1 Add standard MVI and trace elements to TPN Add thiamine  100 mg to TPN  Reduce MIVF to 70 mL/hr at 1800 KCl 10 mEq IV x1 Mag 2g IV x1 KPhos 15mmol IV x1 Monitor TPN labs on Mon/Thurs, daily x3 or until stable  Thank you for involving pharmacy in this patient's care.  Delon Sax, PharmD, BCPS Clinical Pharmacist Clinical phone for 02/02/2024 is [phone] 02/02/2024 11:03 AM

## 2024-02-02 NOTE — Progress Notes (Signed)
 [facility]   ROUNDING   NOTE LOMETA RIGGIN FMW:[id number]  DOB: 03-23-1971  DOA: 02/01/2024  PCP: Georgina Basket, MD  02/02/2024,2:23 PM  LOS: 1 day    Code Status:  FUll     From:  home  Current Dispo: unclear    53 year old black female Breast cancer metastasized to bone on anastrozole  Verzenio  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 Lyndel on 8/8 with discomfort irritation around the tube site with gauze and has not started tube feeds   8/12 was seen by nurse practitioner with palliative care --[name] with significant abdominal pain nausea despite oxycodone  7.5 every 4 as needed-gabapentin  was increased to iii/day also found to be hypotensive  Ultimately admitted after labs resulted showing significant abnormalities of electrolytes General surgeon consulted regarding management etc.On admission 8/14 consulted oncology, consulted IR   CT ABD pelvis pancreatic head mass not well-visualized progressive extrahepatic biliary dilatation abrupt cut off pancreatic head-progressive moderate hydronephrosis proximal hydroureter no intrarenal calculi identified?  Intrinsic stricture-progressive peritoneal carcinomatosis-interval mild ascites-percutaneous jejunostomy catheter in place but balloon has been withdrawn within the tract of the anterior abdominal wall--stable osseous metastatic disease with pathological fracture L3 and mid loss of height-diverticulosis without diverticulitis  Plan  Malfunction of G-tube/J-tube  Underlying pancreatic malignancy now with peritoneal carcinomatosis no stool in a week but passing some  gas General Surgery has seen and patient has also been seen by IR who is repositioning both the G-tube and the J-tube to permit feeds General Surgery discussed worsening cancer with the patient and patient wants to continue aggressive nutrition and care Oncology has been consulted--will ask IR to coinisder Peritoneal bx  Anemia of malignancy Hemoglobin dropped to 6.7-transfusing 2 units PRBC for volume Check Hemoccult but suspect with microcytic nature and underlying cancer with malnutrition that may be the source more so Watch her platelets carefully as she is becoming slightly thrombocytopenic  Multiple lab electrolyte abnormalities Hyponatremia-corrected with saline  hypokalemia and hypomagnesemia-corrected with NS +40 KCl at 70 cc/H to continue Hypocalcemia-still low so giving 1 g calcium  gluconate today Repeat all labs in a.m.   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 Pain control --oxycodone  1 tab every 4 as needed moderate pain  IV morphine  2 mg every 2 as needed severe pain, continue gabapentin  300 3 times daily Continue Protonix  40 twice daily    Pancreatic mass duodenal stricture JG bypass 7/14--likely pancreatic malignancy See above details   Underlying prior history of breast cancer with mets to the bone  I sat down for about 10 minutes with, drew diagrams of the GI anatomy as best as I understood it and explained to her that she has spreading cancer She has survived breast cancer and is willing to trial chemotherapy   Data Reviewed:  Sodium 139 potassium 3.7 CO2 25 BUN/creatinine 5/0.6 AST/ALT 74/74 bilirubin 0.8 Alk phos 518 Albumin  2.3 Hemoglobin 6.7 platelets 146 WBC 4   DVT prophylaxis: SCD  Status is: Inpatient Remains inpatient appropriate because:   Requires further management  Subjective:   Awake coherent pleasant No distress  no fever no chills no nausea currently really wants to eat or drink We will allow ice chips     Objective + exam Vitals:   02/01/24 2300 02/02/24 0500 02/02/24 0904 02/02/24 1059  BP: 98/63 98/63 108/76   Pulse: 84 81 75   Resp: 16 16 16    Temp: 98.4 F (36.9 C) 97.8 F (36.6 C)    TempSrc: Oral Oral    SpO2: 100% 100% 100%   Weight:    80 kg  Height:    5' 11 (1.803 m)   Filed Weights   02/02/24 1059  Weight: 80 kg     Examination: EOMI NCAT no focal deficit no icterus Neck soft supple S1-S2 no murmur PICC line replaced noted Abdomen slight distended GJ tube in place G-tube still out No lower extremity edema     Scheduled Meds:  sodium chloride    Intravenous Once   acetaminophen   650 mg Per Tube Once   Chlorhexidine  Gluconate Cloth  6 each Topical Daily   diphenhydrAMINE   25 mg Per Tube Once   furosemide   20 mg Intravenous Once   gabapentin   300 mg Oral TID   heparin   5,000 Units Subcutaneous Q8H   pantoprazole   40 mg Oral BID   polyethylene glycol  17 g Oral BID   senna  2 tablet Oral BID   Continuous Infusions:  0.9 % NaCl with KCl 40 mEq / L Stopped (02/02/24 0426)   0.9 % NaCl with KCl 40 mEq / L     calcium  gluconate     potassium PHOSPHATE  IVPB (in mmol)     TPN ADULT (ION)      Time 85  Including 20 minutes encounter for palliative care  Jai-Gurmukh Yosgar Demirjian, MD  [facility]

## 2024-02-02 NOTE — Progress Notes (Signed)
 Chelsea Hansen [id number] - [facility] Liaison Note:  This patient is currently enrolled in [facility] outpatient-based palliative care.   Please call for any outpatient based palliative care related questions or concerns.  Elouise Husband, BSN, RN, OCN [employer] [phone]

## 2024-02-02 NOTE — Progress Notes (Signed)
 Peripherally Inserted Central Catheter Placement  The IV Nurse has discussed with the patient and/or persons authorized to consent for the patient, the purpose of this procedure and the potential benefits and risks involved with this procedure.  The benefits include less needle sticks, lab draws from the catheter, and the patient may be discharged home with the catheter. Risks include, but not limited to, infection, bleeding, blood clot (thrombus formation), and puncture of an artery; nerve damage and irregular heartbeat and possibility to perform a PICC exchange if needed/ordered by physician.  Alternatives to this procedure were also discussed.  Bard Power PICC patient education guide, fact sheet on infection prevention and patient information card has been provided to patient /or left at bedside.    PICC Placement Documentation  PICC Double Lumen 02/02/24 Left Basilic 43 cm 0 cm (Active)  Indication for Insertion or Continuance of Line Administration of hyperosmolar/irritating solutions (i.e. TPN, Vancomycin , etc.) 02/02/24 1409  Exposed Catheter (cm) 0 cm 02/02/24 1409  Site Assessment Clean, Dry, Intact 02/02/24 1409  Lumen #1 Status Flushed;Saline locked;Blood return noted 02/02/24 1409  Lumen #2 Status Flushed;Saline locked;Blood return noted 02/02/24 1409  Dressing Type Transparent;Securing device 02/02/24 1409  Dressing Status Antimicrobial disc/dressing in place;Clean, Dry, Intact 02/02/24 1409  Line Care Connections checked and tightened 02/02/24 1409  Line Adjustment (NICU/IV Team Only) No 02/02/24 1409  Dressing Intervention New dressing;Adhesive placed at insertion site (IV team only) 02/02/24 1409  Dressing Change Due 02/09/24 02/02/24 1409       Jolee Na 02/02/2024, 2:19 PM

## 2024-02-03 ENCOUNTER — Inpatient Hospital Stay (HOSPITAL_COMMUNITY): Payer: MEDICAID

## 2024-02-03 ENCOUNTER — Encounter: Payer: Self-pay | Admitting: Hematology and Oncology

## 2024-02-03 ENCOUNTER — Other Ambulatory Visit: Payer: Self-pay

## 2024-02-03 ENCOUNTER — Other Ambulatory Visit: Payer: Self-pay | Admitting: Radiology

## 2024-02-03 DIAGNOSIS — E43 Unspecified severe protein-calorie malnutrition: Secondary | ICD-10-CM | POA: Insufficient documentation

## 2024-02-03 DIAGNOSIS — K56609 Unspecified intestinal obstruction, unspecified as to partial versus complete obstruction: Secondary | ICD-10-CM | POA: Diagnosis not present

## 2024-02-03 DIAGNOSIS — E440 Moderate protein-calorie malnutrition: Secondary | ICD-10-CM | POA: Insufficient documentation

## 2024-02-03 HISTORY — PX: IR REPLC DUODEN/JEJUNO TUBE PERCUT W/FLUORO: IMG2334

## 2024-02-03 LAB — BASIC METABOLIC PANEL WITH GFR
Anion gap: 10 (ref 5–15)
BUN: <5 mg/dL — ABNORMAL LOW (ref 6–20)
CO2: 25 mmol/L (ref 22–32)
Calcium: 7.1 mg/dL — ABNORMAL LOW (ref 8.9–10.3)
Chloride: 102 mmol/L (ref 98–111)
Creatinine, Ser: 0.49 mg/dL (ref 0.44–1.00)
GFR, Estimated: >60 mL/min (ref >60)
Glucose, Bld: 93 mg/dL (ref 70–99)
Potassium: 3.6 mmol/L (ref 3.5–5.1)
Sodium: 137 mmol/L (ref 135–145)

## 2024-02-03 LAB — TYPE AND SCREEN
ABO/RH(D): B POS
Antibody Screen: NEGATIVE
Unit division: 0
Unit division: 0

## 2024-02-03 LAB — BPAM RBC
Blood Product Expiration Date: 202509022359
Blood Product Expiration Date: 202509022359
ISSUE DATE / TIME: 202508141604
ISSUE DATE / TIME: 202508142237
Unit Type and Rh: 7300
Unit Type and Rh: 7300

## 2024-02-03 LAB — PHOSPHORUS: Phosphorus: 3.7 mg/dL (ref 2.5–4.6)

## 2024-02-03 LAB — CBC
HCT: 26.4 % — ABNORMAL LOW (ref 36.0–46.0)
Hemoglobin: 8.7 g/dL — ABNORMAL LOW (ref 12.0–15.0)
MCH: 26 pg (ref 26.0–34.0)
MCHC: 33 g/dL (ref 30.0–36.0)
MCV: 79 fL — ABNORMAL LOW (ref 80.0–100.0)
Platelets: 122 K/uL — ABNORMAL LOW (ref 150–400)
RBC: 3.34 MIL/uL — ABNORMAL LOW (ref 3.87–5.11)
RDW: 19.6 % — ABNORMAL HIGH (ref 11.5–15.5)
WBC: 4.4 K/uL (ref 4.0–10.5)
nRBC: 0.5 % — ABNORMAL HIGH (ref 0.0–0.2)

## 2024-02-03 LAB — MAGNESIUM: Magnesium: 1.9 mg/dL (ref 1.7–2.4)

## 2024-02-03 LAB — PROTIME-INR
INR: 1.2 (ref 0.8–1.2)
Prothrombin Time: 15.8 s — ABNORMAL HIGH (ref 11.4–15.2)

## 2024-02-03 LAB — GLUCOSE, CAPILLARY: Glucose-Capillary: 96 mg/dL (ref 70–99)

## 2024-02-03 MED ORDER — DIPHENHYDRAMINE HCL 50 MG/ML IJ SOLN
INTRAMUSCULAR | Status: AC
Start: 2024-02-03 — End: 2024-02-03
  Filled 2024-02-03: qty 1

## 2024-02-03 MED ORDER — TRAVASOL 10 % IV SOLN
INTRAVENOUS | Status: AC
  Filled 2024-02-03: qty 705.6

## 2024-02-03 MED ORDER — LACTULOSE ENEMA
300.0000 mL | Freq: Once | ORAL | Status: DC
  Filled 2024-02-03: qty 300

## 2024-02-03 MED ORDER — FENTANYL CITRATE (PF) 100 MCG/2ML IJ SOLN
INTRAMUSCULAR | Status: AC
  Filled 2024-02-03: qty 2

## 2024-02-03 MED ORDER — MIDAZOLAM HCL 2 MG/2ML IJ SOLN
INTRAMUSCULAR | Status: AC | PRN
  Administered 2024-02-03: 1 mg via INTRAVENOUS
  Administered 2024-02-03 (×2): .5 mg via INTRAVENOUS

## 2024-02-03 MED ORDER — POTASSIUM CHLORIDE 10 MEQ/50ML IV SOLN
10.0000 meq | INTRAVENOUS | Status: AC
  Administered 2024-02-03: 10 meq via INTRAVENOUS
  Filled 2024-02-03 (×2): qty 50

## 2024-02-03 MED ORDER — MIDAZOLAM HCL 2 MG/2ML IJ SOLN
INTRAMUSCULAR | Status: AC
  Filled 2024-02-03: qty 2

## 2024-02-03 MED ORDER — OSMOLITE 1.2 CAL PO LIQD: 1000.0000 mL | ORAL | Status: DC

## 2024-02-03 MED ORDER — SODIUM CHLORIDE 0.9% FLUSH: 9.0000 mL | INTRAVENOUS | Status: DC | PRN

## 2024-02-03 MED ORDER — VITAL 1.5 CAL PO LIQD
1000.0000 mL | ORAL | Status: DC
  Administered 2024-02-03: 1000 mL
  Filled 2024-02-03 (×4): qty 1000

## 2024-02-03 MED ORDER — IOHEXOL 300 MG/ML  SOLN
50.0000 mL | Freq: Once | INTRAMUSCULAR | Status: AC | PRN
  Administered 2024-02-03: 20 mL

## 2024-02-03 MED ORDER — LIDOCAINE VISCOUS HCL 2 % MT SOLN
OROMUCOSAL | Status: AC
  Filled 2024-02-03: qty 15

## 2024-02-03 MED ORDER — LIDOCAINE HCL 1 % IJ SOLN
INTRAMUSCULAR | Status: AC
  Filled 2024-02-03: qty 10

## 2024-02-03 MED ORDER — MORPHINE SULFATE 1 MG/ML IV SOLN PCA
INTRAVENOUS | Status: DC
  Administered 2024-02-04: 4 mg via INTRAVENOUS
  Filled 2024-02-03 (×2): qty 30

## 2024-02-03 MED ORDER — LIDOCAINE VISCOUS HCL 2 % MT SOLN
15.0000 mL | Freq: Once | OROMUCOSAL | Status: AC
  Administered 2024-02-03: 4 mL via OROMUCOSAL
  Filled 2024-02-03: qty 15

## 2024-02-03 MED ORDER — ONDANSETRON HCL 4 MG/2ML IJ SOLN
4.0000 mg | Freq: Four times a day (QID) | INTRAMUSCULAR | Status: DC | PRN
  Administered 2024-02-04: 4 mg via INTRAVENOUS
  Filled 2024-02-03: qty 2

## 2024-02-03 MED ORDER — NALOXONE HCL 0.4 MG/ML IJ SOLN
0.4000 mg | INTRAMUSCULAR | Status: DC | PRN
Start: 2024-02-03 — End: 2024-02-05

## 2024-02-03 MED ORDER — POTASSIUM CHLORIDE IN NACL 40-0.9 MEQ/L-% IV SOLN
INTRAVENOUS | Status: DC
  Filled 2024-02-03 (×2): qty 1000

## 2024-02-03 MED ORDER — LIDOCAINE HCL 1 % IJ SOLN
INTRAMUSCULAR | Status: AC
Start: 2024-02-03 — End: 2024-02-03
  Filled 2024-02-03: qty 10

## 2024-02-03 MED ORDER — FENTANYL CITRATE (PF) 100 MCG/2ML IJ SOLN
INTRAMUSCULAR | Status: AC | PRN
  Administered 2024-02-03 (×2): 50 ug via INTRAVENOUS

## 2024-02-03 MED ORDER — DIPHENHYDRAMINE HCL 50 MG/ML IJ SOLN
INTRAMUSCULAR | Status: AC | PRN
  Administered 2024-02-03: 50 mg via INTRAVENOUS

## 2024-02-03 MED ORDER — CALCIUM GLUCONATE-NACL 1-0.675 GM/50ML-% IV SOLN
1.0000 g | Freq: Once | INTRAVENOUS | Status: AC
  Administered 2024-02-03: 1000 mg via INTRAVENOUS
  Filled 2024-02-03: qty 50

## 2024-02-03 NOTE — Consult Note (Signed)
 Chief Complaint: Patient was seen in consultation today for malpositioned J-tube, peritoneal carcinomatosis.  Referring Physician(s): Dr. Reggie Grimes  Supervising Physician: Hughes Simmonds  Patient Status: [facility] - In-pt  History of Present Illness: Chelsea Hansen is a 53 y.o. female with metastatic breast cancer with several recent hospitalizations due to progressive vs. Additional disease.  She was recently admitted in July 2025 for abdominal pain, nausea, vomiting due to duodenal stricture and SBO.  She underwent EGD x3 with attempts for dilation of biliary stenosis with biopsy as well as surgically placed G and J tubes 7/22, however no new malignancy has been defintively found.  ULtimately she received a celiac plexus block 7/28 for pain management in IR and returned home, however she is now readmitted with symptom recurrence and new imaging findings concerning for malpositioned J-tube as well as peritoneal carcinomatosis.  IR consulted for G-tube exchange, J-tube reposition, and abdominal mass biopsy.   G-tube was exchanged at bedside yesterday without complication.  Plan made with Dr. Hughes to proceed with over-the-wire exchange of J tube as well as CT-guided biopsy today.   Discussed with patient who is anxious but agreeable.  Reviewed procedure, goals, and benefits.  All questions answered.   Past Medical History:  Diagnosis Date   Allergy    seasonal allergies   Anxiety    on meds   Asthma    uses inhaler   Breast cancer (HCC) 2012   RIGHT lumpectomy   Cancer (HCC) 2022   RIGHT breast lump-dx 2022   Depression    on meds   DVT (deep venous thrombosis) (HCC) 2010   after hysterectomy   Family history of uterine cancer    GERD (gastroesophageal reflux disease)    with certain foods/OTC PRN meds   Headache(784.0)    History of radiation therapy    Bilateral breast- 09/03/21-11/02/21- Dr. Lynwood Nasuti   Hyperlipidemia    on meds   Hypertension    on meds   SVT  (supraventricular tachycardia) (HCC)     Past Surgical History:  Procedure Laterality Date   ABDOMINAL HYSTERECTOMY     AXILLARY SENTINEL NODE BIOPSY Left 07/09/2021   Procedure: LEFT AXILLARY SENTINEL NODE BIOPSY;  Surgeon: [name], MD;  Location: [facility];  Service: General;  Laterality: Left;   BALLOON DILATION N/A 12/13/2023   Procedure: MERRILL HODGKIN;  Surgeon: Federico Rosario BROCKS, MD;  Location: [facility] END[facility]COPY;  Service: Gastroenterology;  Laterality: N/A;   BIOPSY  06/29/2022   Procedure: BIOPSY;  Surgeon: Legrand Victory LITTIE DOUGLAS, MD;  Location: [facility] ENDOSCOPY;  Service: Gastroenterology;;   BIOPSY OF SKIN SUBCUTANEOUS TISSUE AND/OR MUCOUS MEMBRANE  12/13/2023   Procedure: BIOPSY, SKIN, SUBCUTANEOUS TISSUE, OR MUCOUS MEMBRANE;  Surgeon: Federico Rosario BROCKS, MD;  Location: [facility] ENDOSCOPY;  Service: Gastroenterology;;   BREAST EXCISIONAL BIOPSY Right 09/16/2009   BREAST LUMPECTOMY WITH RADIOACTIVE SEED LOCALIZATION Bilateral 07/09/2021   Procedure: BILATERAL BREAST LUMPECTOMY WITH RADIOACTIVE SEED LOCALIZATION;  Surgeon: [name], MD;  Location: [facility];  Service: General;  Laterality: Bilateral;   BREAST SURGERY     lumpectomy   ESOPHAGOGASTRODUODENOSCOPY N/A 12/13/2023   Procedure: EGD (ESOPHAGOGASTRODUODENOSCOPY);  Surgeon: Federico Rosario BROCKS, MD;  Location: [facility] ENDOSCOPY;  Service: Gastroenterology;  Laterality: N/A;   ESOPHAGOGASTRODUODENOSCOPY N/A 12/26/2023   Procedure: EGD (ESOPHAGOGASTRODUODENOSCOPY);  Surgeon: Wilhelmenia Aloha Raddle., MD;  Location: [facility];  Service: Gastroenterology;  Laterality: N/A;   ESOPHAGOGASTRODUODENOSCOPY N/A 12/27/2023   Procedure: EGD (ESOPHAGOGASTRODUODENOSCOPY);  Surgeon: Wilhelmenia Aloha  Mickey., MD;  Location: [facility];  Service: Gastroenterology;  Laterality: N/A;   ESOPHAGOGASTRODUODENOSCOPY (EGD) WITH PROPOFOL  N/A 06/29/2022   Procedure: ESOPHAGOGASTRODUODENOSCOPY (EGD) WITH PROPOFOL ;  Surgeon: Legrand Victory LITTIE DOUGLAS, MD;  Location: [facility];  Service: Gastroenterology;  Laterality: N/A;   EUS N/A 12/26/2023   Procedure: ULTRASOUND, UPPER GI TRACT, ENDOSCOPIC;  Surgeon: Wilhelmenia Aloha Mickey., MD;  Location: [facility];  Service: Gastroenterology;  Laterality: N/A;   EUS N/A 12/27/2023   Procedure: ULTRASOUND, UPPER GI TRACT, ENDOSCOPIC;  Surgeon: Wilhelmenia Aloha Mickey., MD;  Location: [facility];  Service: Gastroenterology;  Laterality: N/A;   IR CM INJ ANY COLONIC TUBE W/FLUORO  01/20/2024   LAPAROSCOPIC INSERTION GASTROSTOMY TUBE N/A 01/10/2024   Procedure: laparoscopic insertion of gastrostomy tube, laparoscopic insertion of jejunostomy tube, upper endoscopy;  Surgeon: Kinsinger, Herlene Righter, MD;  Location: [facility];  Service: General;  Laterality: N/A;  Laproscopic insertion   LAPAROSCOPIC ROUX-EN-Y GASTRIC BYPASS WITH HIATAL HERNIA REPAIR N/A 01/02/2024   Procedure: LAPAROSCOPIC CREATION GASTRJEJONOSTOMY;  Surgeon: Lyndel Deward PARAS, MD;  Location: [facility];  Service: General;  Laterality: N/A;  GASTROJEJUNOSTOMY   PORT-A-CATH REMOVAL Left 07/09/2021   Procedure: REMOVAL PORT-A-CATH;  Surgeon: [name], MD;  Location: [facility];  Service: General;  Laterality: Left;   PORTACATH PLACEMENT Left 01/26/2021   Procedure: INSERTION PORT-A-CATH;  Surgeon: [name], MD;  Location: [facility];  Service: General;  Laterality: Left;   RADIOACTIVE SEED GUIDED AXILLARY SENTINEL LYMPH NODE Right 07/09/2021   Procedure: RADIOACTIVE SEED GUIDED RIGHT AXILLARY SENTINEL LYMPH NODE DISSECTION;  Surgeon: [name], MD;  Location: [facility];  Service: General;  Laterality: Right;   TUBAL LIGATION      Allergies: Patient has no known allergies.  Medications: Prior to Admission medications   Medication Sig Start Date End Date Taking? Authorizing Provider  oxyCODONE -acetaminophen  (PERCOCET) 7.5-325 MG tablet Take 1 tablet by mouth every 4 (four) hours as  needed for severe pain (pain score 7-10) (Take 1-2 tablets by mouth every 6 (six) hours as needed for severe pain (pain score 7-10)). Max daily dose 8 tablets per provider. 02/04/24  Yes [name], [name], NP  abemaciclib  (VERZENIO ) 50 MG tablet Take 50 mg by mouth 2 (two) times daily. Patient not taking: Reported on 12/22/2023    [provider]  anastrozole  (ARIMIDEX ) 1 MG tablet Take 1 tablet (1 mg total) by mouth daily. Patient not taking: Reported on 01/25/2024 04/18/23   [name], NP  calcitRIOL  (ROCALTROL ) 0.5 MCG capsule Take 1 capsule (0.5 mcg total) by mouth daily. Patient not taking: Reported on 01/25/2024 12/15/23   [name], [name], MD  Calcium  Carb-Cholecalciferol  600-10 MG-MCG TABS Take 1 tablet by mouth 3 (three) times daily. Patient not taking: Reported on 01/25/2024 12/14/23 01/13/24  [name], [name], MD  gabapentin  (NEURONTIN ) 300 MG capsule Take 1 capsule (300 mg total) by mouth 3 (three) times daily. Patient not taking: Reported on 02/02/2024 05/26/23   [name], [name], MD  pantoprazole  (PROTONIX ) 40 MG tablet Take 1 tablet (40 mg total) by mouth 2 (two) times daily. Patient not taking: Reported on 02/02/2024 01/25/24   [name], NP  polyethylene glycol powder (GLYCOLAX /MIRALAX ) 17 GM/SCOOP powder Mix 17 g in 4 oz of liquid and take by mouth 2 (two) times daily as needed. Patient not taking: Reported on 02/02/2024 11/30/23   [name], NP  prochlorperazine  (COMPAZINE ) 10 MG tablet Take 1 tablet (10 mg total) by mouth every 6 (  six) hours as needed for nausea or vomiting. Patient not taking: Reported on 02/02/2024 01/25/24   [name], NP  propranolol  (INDERAL ) 10 MG tablet Take 1 tablet (10 mg total) by mouth 2 (two) times daily. Please call [phone] to schedule an appointment for future refills. Thank you. 3rd attempt. Patient not taking: Reported on 12/13/2023 06/01/23   [name], [name], DO  scopolamine   (TRANSDERM-SCOP) 1 MG/3DAYS Place 1 patch (1.5 mg total) onto the skin behind ear every 3 (three) days as needed for dizziness, nausea or vomiting. Patient not taking: Reported on 02/02/2024 01/31/24   [name], Athena N, NP  senna (SENOKOT) 8.6 MG TABS tablet Take 2 tablets (17.2 mg total) by mouth 2 (two) times daily. Patient not taking: Reported on 02/02/2024 01/31/24   [place] [name], NP     Family History  Problem Relation Age of Onset   Breast cancer Mother        15s   Hypertension Father    Uterine cancer Maternal Aunt    Ovarian cancer Maternal Aunt    Breast cancer Maternal Aunt        6s   Breast cancer Cousin 4   Uterine cancer Cousin 50   Colon polyps Neg Hx    Colon cancer Neg Hx    Esophageal cancer Neg Hx    Stomach cancer Neg Hx     Social History   Socioeconomic History   Marital status: Single    Spouse name: Not on file   Number of children: Not on file   Years of education: Not on file   Highest education level: Not on file  Occupational History   Not on file  Tobacco Use   Smoking status: Never   Smokeless tobacco: Never  Vaping Use   Vaping status: Never Used  Substance and Sexual Activity   Alcohol use: Not Currently    Alcohol/week: 7.0 standard drinks of alcohol    Types: 7 Standard drinks or equivalent per week    Comment: occassional   Drug use: No   Sexual activity: Not Currently  Other Topics Concern   Not on file  Social History Narrative   Not on file   Social Drivers of Health   Financial Resource Strain: High Risk (01/14/2022)   Overall Financial Resource Strain (CARDIA)    Difficulty of Paying Living Expenses: Very hard  Food Insecurity: No Food Insecurity (02/02/2024)   Hunger Vital Sign    Worried About Running Out of Food in the Last Year: Never true    Ran Out of Food in the Last Year: Never true  Recent Concern: Food Insecurity - Food Insecurity Present (11/24/2023)   Hunger Vital Sign    Worried  About Running Out of Food in the Last Year: Sometimes true    Ran Out of Food in the Last Year: Sometimes true  Transportation Needs: No Transportation Needs (02/02/2024)   PRAPARE - Administrator, Civil Service (Medical): No    Lack of Transportation (Non-Medical): No  Physical Activity: Insufficiently Active (08/01/2023)   Exercise Vital Sign    Days of Exercise per Week: 1 day    Minutes of Exercise per Session: 10 min  Stress: Stress Concern Present (10/10/2023)   [employer] of Occupational Health - Occupational Stress Questionnaire    Feeling of Stress : Rather much  Social Connections: Not on file     Review of Systems: A 12 point ROS discussed and pertinent positives are indicated  in the HPI above.  All other systems are negative.  Review of Systems  Constitutional:  Negative for fatigue and fever.  Respiratory:  Negative for cough and shortness of breath.   Cardiovascular:  Negative for chest pain.  Gastrointestinal:  Positive for abdominal pain, nausea and vomiting.  Musculoskeletal:  Negative for back pain.  Psychiatric/Behavioral:  Negative for behavioral problems and confusion.     Vital Signs: BP 100/67 (BP Location: Right Arm)   Pulse 98   Temp 98.7 F (37.1 C) (Oral)   Resp 18   Ht 5' 11 (1.803 m)   Wt 176 lb 6.4 oz (80 kg)   SpO2 98%   BMI 24.60 kg/m   Physical Exam Vitals and nursing note reviewed.  Constitutional:      General: She is not in acute distress.    Appearance: Normal appearance. She is not ill-appearing.  Neck:     Comments: Port in place Cardiovascular:     Rate and Rhythm: Normal rate and regular rhythm.  Pulmonary:     Effort: Pulmonary effort is normal.     Breath sounds: Normal breath sounds.  Abdominal:     General: Abdomen is flat. There is no distension.     Tenderness: There is abdominal tenderness.     Comments: G-tube in place.  J-tube in place.   Skin:    General: Skin is warm and dry.  Neurological:      General: No focal deficit present.     Mental Status: She is alert and oriented to person, place, and time. Mental status is at baseline.  Psychiatric:        Mood and Affect: Mood normal.        Behavior: Behavior normal.        Thought Content: Thought content normal.        Judgment: Judgment normal.      MD Evaluation Airway: WNL Heart: WNL Abdomen: WNL Chest/ Lungs: WNL ASA  Classification: 3 Mallampati/Airway Score: Two   Imaging: IR REPLACE G-TUBE COMPLEX WO FLUORO (TRACT REV) Result Date: 02/02/2024 INDICATION: Patient with a history of pancreatic mass with duodenal stricture/obstruction. She underwent G-tube placement and J-tube placement 01/10/2024 by surgery. Patient was seen in IR January 20, 2024 and underwent G-tube exchange/upsize to a 24 [place] balloon retention catheter due to leaking. The 22 French balloon retention jejunostomy tube was repositioned. IR now asked to evaluate patient due to leaking from the gastrostomy and the jejunostomy. EXAM: Gastrostomy tube exchange MEDICATIONS: None ANESTHESIA/SEDATION: None CONTRAST:  None FLUOROSCOPY: None COMPLICATIONS: None immediate. PROCEDURE: The patient was assessed at the bedside and the gastrostomy tube tract was found to be dilated. The 24 French balloon retention gastrostomy tube was removed and exchanged for a 28 French balloon retention gastrostomy tube. Post exchange x-ray shows the gastrostomy tube to be well positioned within the gastric lumen. The jejunostomy site was also found to be enlarged and CT imaging shows the retention balloon has been retracted within the tract within the anterior abdominal wall. IMPRESSION: Technically successful bedside gastrostomy tube exchange from a 24 [place] to a 28 [place] balloon retention. Patient will come to IR for jejunostomy exchange/reposition. Electronically Signed   By: Ester Sides M.D.   On: 02/02/2024 15:03   DG ABDOMEN PEG TUBE LOCATION Result Date: 02/02/2024 CLINICAL  DATA:  Gastrostomy tube complication. EXAM: ABDOMEN - 1 VIEW COMPARISON:  January 17, 2024. FINDINGS: No abnormal bowel dilatation is noted. Distal tip of gastrostomy tube appears to  be within gastric lumen with normal contrast filling of the stomach. No extravasation or leakage is noted. Small amount of contrast is noted in nondilated proximal small bowel. IMPRESSION: Gastrostomy tube tip appears to be within gastric lumen. Electronically Signed   By: Lynwood Landy Raddle M.D.   On: 02/02/2024 13:19   DG CHEST PORT 1 VIEW Result Date: 02/02/2024 CLINICAL DATA:  Status post PICC placement. EXAM: PORTABLE CHEST 1 VIEW COMPARISON:  January 03, 2024. FINDINGS: The heart size and mediastinal contours are within normal limits. Right-sided PICC line is noted with catheter misdirected into the right internal jugular vein. Both lungs are clear. The visualized skeletal structures are unremarkable. IMPRESSION: Right-sided PICC line is noted with catheter going retrograde into right internal jugular vein. Repositioning is recommended. These results will be called to the ordering clinician or representative by the Radiologist Assistant, and communication documented in the PACS or zVision Dashboard. Electronically Signed   By: Lynwood Landy Raddle M.D.   On: 02/02/2024 13:17   US  EKG SITE RITE Result Date: 02/02/2024 If Site Rite image not attached, placement could not be confirmed due to current cardiac rhythm.  CT ABDOMEN PELVIS W CONTRAST Result Date: 02/02/2024 CLINICAL DATA:  Pancreatic cancer EXAM: CT ABDOMEN AND PELVIS WITH CONTRAST TECHNIQUE: Multidetector CT imaging of the abdomen and pelvis was performed using the standard protocol following bolus administration of intravenous contrast. RADIATION DOSE REDUCTION: This exam was performed according to the departmental dose-optimization program which includes automated exposure control, adjustment of the mA and/or kV according to patient size and/or use of iterative  reconstruction technique. CONTRAST:  75mL OMNIPAQUE  IOHEXOL  350 MG/ML SOLN COMPARISON:  None Available. FINDINGS: Lower chest: No acute abnormality. Hepatobiliary: Interval development of mild ascites with trace pericholecystic fluid. No superimposed pericholecystic inflammatory changes gallbladder wall thickening identified. Liver unremarkable. Progressive extrahepatic biliary ductal dilation with the extrahepatic bile duct measuring up to 15 mm in diameter. The extrahepatic bile duct demonstrates an abrupt cut off in the region the pancreatic head in the expected location of the pancreatic head mass better seen on MRI examination of 12/23/2023. This mass itself is not visualized on this examination however. Pancreas: Known pancreatic head mass is not well visualized. No pancreatic duct dilation. No peripancreatic inflammatory changes or fluid collections are seen. Spleen: Unremarkable Adrenals/Urinary Tract: Adrenal glands are unremarkable. The kidneys are normal in size and position. Progressive, moderate right hydronephrosis and proximal hydroureter to the level of the mid ureter. No intrarenal or ureteral calculi are identified in the findings may relate to an underlying intrinsic stricture (55/3). Left kidney is unremarkable. Bladder is unremarkable. Stomach/Bowel: Pre 18 gastrostomy has been replaced with the balloon retention gastrostomy catheter in appropriate position within the gastric lumen. Gastrojejunostomy again noted. Percutaneous jejunostomy catheter is in place, however, the retaining balloon has been further withdrawn and is now within the tract within the anterior abdominal wall (53/3). Pancolonic diverticulosis is again identified, most severe within the ascending colon without superimposed acute inflammatory change. No evidence of obstruction. There is increasing infiltration of the omentum keeping with progressive care peritoneal carcinomatosis. The previously identified peritoneal nodule with  left lower quadrant has enlarged measuring 14 x 23 mm (45/3). Vascular/Lymphatic: There is effacement of the fat plane between the head of the pancreas and the inferior vena cava with narrowing of the inferior vena cava (43/3) suspicious for developing case mint of the vessel by the adjacent pancreatic malignancy this vessel remains patent, however. The abdominal vasculature is otherwise unremarkable. No  pathologic adenopathy identified. Reproductive: Status post hysterectomy. No adnexal masses. Other: No abdominal wall hernia Musculoskeletal: Numerous sclerotic metastases are again identified within the visualized axial skeleton with pathologic fracture of the L3 vertebral body with mild loss of height, similar prior examination. IMPRESSION: 1. Known pancreatic head mass is not well visualized on this examination. Progressive extrahepatic biliary ductal dilation with abrupt cut off in the region of the pancreatic head in the expected location of the pancreatic head mass better seen on MRI examination of 12/23/2023. 2. Progressive, moderate right hydronephrosis and proximal hydroureter to the level of the mid ureter. No intrarenal or ureteral calculi are identified and the findings may relate to an underlying intrinsic stricture. 3. Progressive peritoneal carcinomatosis. 4. Interval development of mild ascites. 5. Percutaneous jejunostomy catheter is in place, however, the retaining balloon has been further withdrawn and is now within the tract within the anterior abdominal wall. 6. Stable osseous metastatic disease with pathologic fracture of the L3 vertebral body with mild loss of height. 7. Pancolonic diverticulosis without superimposed acute inflammatory change. Electronically Signed   By: Dorethia Molt M.D.   On: 02/02/2024 05:10   IR Cm Inj Any Colonic Tube W/Fluoro Result Date: 01/23/2024 INDICATION: History of pancreatic mass and duodenal obstruction with indwelling venting gastrostomy tube and feeding  percutaneous jejunostomy tube. CT last night demonstrated retraction of the balloon retention gastrostomy tube out of the stomach and into the gastrostomy tract and possible mild retraction of the jejunostomy retention balloon. There clinically is also significant leakage of gastric contents/secretions around the percutaneous gastrostomy tube. EXAM: 1. EXCHANGE OF PERCUTANEOUS GASTROSTOMY TUBE UNDER FLUOROSCOPY 2. INJECTION AND MANIPULATION OF PERCUTANEOUS JEJUNOSTOMY UNDER FLUOROSCOPY MEDICATIONS: None ANESTHESIA/SEDATION: None CONTRAST:  30 mL on-administered into the gastric lumen. FLUOROSCOPY: Radiation Exposure Index (as provided by the fluoroscopic device): 11.0 mGy Kerma COMPLICATIONS: None immediate. PROCEDURE: Informed written consent was obtained from the patient after a thorough discussion of the procedural risks, benefits and alternatives. All questions were addressed. Maximal Sterile Barrier Technique was utilized including caps, mask, sterile gowns, sterile gloves, sterile drape, hand hygiene and skin antiseptic. A timeout was performed prior to the initiation of the procedure. A pre-existing 22 French balloon retention gastrostomy tube was injected with contrast material under fluoroscopy. The tube was then cut and removed over a guidewire. A new 24 French balloon retention gastrostomy tube was then advanced over the wire and into the stomach. The retention balloon was inflated with 10 mL of saline. The tube was injected with contrast to confirm position and a fluoroscopic image saved. The indwelling 22 French balloon retention jejunostomy tube was injected with contrast material under fluoroscopy. The retention balloon was deflated and the tube slightly advanced. The retention balloon was then inflated with 7 mL of saline and the tube than brought back to tension at the skin. FINDINGS: As noted on the CT study, the retention balloon of the indwelling 22 French balloon retention gastrostomy tube is  just outside of the gastric lumen. There is still opacification of the stomach with injection of contrast and no contrast entering the peritoneal cavity. There is quite a bit of leakage of gastric secretion and contents around the 22 French tube and up sizing was performed to 24 [place] in order to try to decrease leakage around the gastrostomy. A 24 French tube was easily advanced into the stomach through the pre-existing tract with the tip located in the body of the stomach after replacement. The jejunostomy tube is widely patent and advanced  well into the jejunum. Due to CT findings of the balloon potentially starting to migrate out of the jejunum, manipulation was performed with aspiration yielding only approximately 4 mL of fluid from the retention balloon. After deflation of the balloon and advancement of the tube, the retention balloon was inflated with 7 mL of fluid. IMPRESSION: 1. Replacement of dislodged 22 French balloon retention gastrostomy tube with a slightly larger 24 French balloon retention catheter. The tube lies in the body of the stomach and will continue to be used for venting purposes. 2. Manipulation of 22 French balloon retention jejunostomy with slight advancement of the catheter after deflation of the balloon and reinflation of the retention balloon as above. This catheter is appropriately positioned and widely patent. Electronically Signed   By: Marcey Moan M.D.   On: 01/23/2024 15:35   IR REPLACE G-TUBE COMPLEX WO FLUORO (TRACT REV) Result Date: 01/20/2024 INDICATION: History of pancreatic mass and duodenal obstruction with indwelling venting gastrostomy tube and feeding percutaneous jejunostomy tube. CT last night demonstrated retraction of the balloon retention gastrostomy tube out of the stomach and into the gastrostomy tract and possible mild retraction of the jejunostomy retention balloon. There clinically is also significant leakage of gastric contents/secretions around the  percutaneous gastrostomy tube. EXAM: 1. EXCHANGE OF PERCUTANEOUS GASTROSTOMY TUBE UNDER FLUOROSCOPY 2. INJECTION AND MANIPULATION OF PERCUTANEOUS JEJUNOSTOMY UNDER FLUOROSCOPY MEDICATIONS: None ANESTHESIA/SEDATION: None CONTRAST:  30 mL on-administered into the gastric lumen. FLUOROSCOPY: Radiation Exposure Index (as provided by the fluoroscopic device): 11.0 mGy Kerma COMPLICATIONS: None immediate. PROCEDURE: Informed written consent was obtained from the patient after a thorough discussion of the procedural risks, benefits and alternatives. All questions were addressed. Maximal Sterile Barrier Technique was utilized including caps, mask, sterile gowns, sterile gloves, sterile drape, hand hygiene and skin antiseptic. A timeout was performed prior to the initiation of the procedure. A pre-existing 22 French balloon retention gastrostomy tube was injected with contrast material under fluoroscopy. The tube was then cut and removed over a guidewire. A new 24 French balloon retention gastrostomy tube was then advanced over the wire and into the stomach. The retention balloon was inflated with 10 mL of saline. The tube was injected with contrast to confirm position and a fluoroscopic image saved. The indwelling 22 French balloon retention jejunostomy tube was injected with contrast material under fluoroscopy. The retention balloon was deflated and the tube slightly advanced. The retention balloon was then inflated with 7 mL of saline and the tube than brought back to tension at the skin. FINDINGS: As noted on the CT study, the retention balloon of the indwelling 22 French balloon retention gastrostomy tube is just outside of the gastric lumen. There is still opacification of the stomach with injection of contrast and no contrast entering the peritoneal cavity. There is quite a bit of leakage of gastric secretion and contents around the 22 French tube and up sizing was performed to 24 [place] in order to try to decrease  leakage around the gastrostomy. A 24 French tube was easily advanced into the stomach through the pre-existing tract with the tip located in the body of the stomach after replacement. The jejunostomy tube is widely patent and advanced well into the jejunum. Due to CT findings of the balloon potentially starting to migrate out of the jejunum, manipulation was performed with aspiration yielding only approximately 4 mL of fluid from the retention balloon. After deflation of the balloon and advancement of the tube, the retention balloon was inflated with 7 mL  of fluid. IMPRESSION: 1. Replacement of dislodged 22 French balloon retention gastrostomy tube with a slightly larger 24 French balloon retention catheter. The tube lies in the body of the stomach and will continue to be used for venting purposes. 2. Manipulation of 22 French balloon retention jejunostomy with slight advancement of the catheter after deflation of the balloon and reinflation of the retention balloon as above. This catheter is appropriately positioned and widely patent. Electronically Signed   By: Marcey Moan M.D.   On: 01/20/2024 16:25   CT ABDOMEN PELVIS W CONTRAST Result Date: 01/19/2024 CLINICAL DATA:  Left lower quadrant abdominal pain around the feeding tube site with leaking from the feeding tube. History of breast cancer with suspected pancreatic lesion. * Tracking Code: BO * EXAM: CT ABDOMEN AND PELVIS WITH CONTRAST TECHNIQUE: Multidetector CT imaging of the abdomen and pelvis was performed using the standard protocol following bolus administration of intravenous contrast. RADIATION DOSE REDUCTION: This exam was performed according to the departmental dose-optimization program which includes automated exposure control, adjustment of the mA and/or kV according to patient size and/or use of iterative reconstruction technique. CONTRAST:  OMNIPAQUE  IOHEXOL  300 MG/ML  SOLN COMPARISON:  CT abdomen and pelvis dated 12/16/2023 FINDINGS:  Lower chest: No focal consolidation or pulmonary nodule in the lung bases. Decreased small right pleural effusion. Partially imaged heart size is normal. Hepatobiliary: The superior most aspect of the hepatic dome is not included within the field of view. No focal hepatic lesions. No intra or extrahepatic biliary ductal dilation. Normal gallbladder. Pancreas: Similar appearance of the pancreas. No main pancreatic ductal dilation. Spleen: Normal in size without focal abnormality. Adrenals/Urinary Tract: No adrenal nodules. No suspicious renal mass, calculi or hydronephrosis. Interval resolution of previously noted right hydronephrosis. Bilateral subcentimeter hypodensities, previously characterized as simple cysts. No focal bladder wall thickening. Stomach/Bowel: Small hiatal hernia. Postsurgical changes of the stomach with patent gastrojejunostomy. Percutaneous gastrostomy balloon has retracted into the subcutaneous soft tissues of the anterior left upper quadrant (2:26) with resulting gas-filled tract to the stomach (2:25). The T tacks of the gastrostomy are present within the subcutaneous soft tissues. There are small foci of gas surrounding the displaced gastrostomy balloon. Left lower quadrant approach jejunostomy tube with slightly retracted appearance of the balloon, which appears to protrude slightly into the abdominal musculature (2:43). Jejunostomy tube now terminates within a right lower quadrant bowel loop. No evidence of bowel wall thickening, distention, or inflammatory changes. Colonic diverticulosis without acute diverticulitis. Normal appendix. Vascular/Lymphatic: No significant vascular findings are present. No enlarged abdominal or pelvic lymph nodes. Increased size and conspicuity of a peripherally enhancing 1.4 x 1.2 cm nodule along the peripheral lateral left lower peritoneum (2:38) compared to 01/15/2024 when it was subtly seen and measured 1.0 x 0.7 cm in retrospect. Reproductive: No adnexal  masses. Other: Small volume pelvic free fluid. No free air or fluid collection. Mild thickening of the inferior peritoneum (10:92). Musculoskeletal: Extensive osseous sclerotic lesions. Similar compression deformity of L3. IMPRESSION: 1. Percutaneous gastrostomy balloon has retracted into the subcutaneous soft tissues of the anterior left upper abdomen with resulting gas-filled tract to the stomach. The T tacks of the gastrostomy are present within the subcutaneous soft tissues surrounding the displaced balloon. 2. Left lower quadrant approach jejunostomy tube with slightly retracted appearance of the balloon, which appears to protrude slightly into the abdominal musculature. Jejunostomy tube now terminates within a right lower quadrant bowel loop. 3. Increased size and conspicuity of a peripherally enhancing 1.4 x 1.2  cm nodule along the peripheral lateral left lower peritoneum, suspicious for peritoneal metastasis. 4. Mild thickening of the inferior peritoneum, which may be reactive or represent peritoneal carcinomatosis. 5. Decreased small right pleural effusion. 6. Extensive osseous sclerotic lesions, consistent with metastatic disease. Electronically Signed   By: Limin  Xu M.D.   On: 01/19/2024 21:45   DG Abd 1 View Result Date: 01/17/2024 CLINICAL DATA:  Abdominal pain. EXAM: ABDOMEN - 1 VIEW COMPARISON:  Abdominal radiograph dated 01/09/2024. FINDINGS: Percutaneous gastrostomy with balloon in the epigastric area to the left of the spine. No bowel dilatation. Air is noted in the colon. No acute osseous pathology. IMPRESSION: Nonobstructive bowel gas pattern. Electronically Signed   By: Vanetta Chou M.D.   On: 01/17/2024 15:24   CT CELIAC PLEXUS BLOCK ANESTHETIC Result Date: 01/16/2024 CLINICAL DATA:  Severe abdominal pain, incompletely controlled. Concern of possible pancreatic lesion affecting the duodenum, biopsies negative. EXAM: CELIAC PLEXUS BLOCK  UNDER CT GUIDANCE ANESTHESIA/SEDATION:  Intravenous Fentanyl  100mcg and Versed  2mg  were administered by RN during a total moderate (conscious) sedation time of 22 minutes; the patient's level of consciousness and physiological / cardiorespiratory status were monitored continuously by radiology RN under my direct supervision. TECHNIQUE: Select scans through the abdomen were obtained and an appropriate entry sites identified. Moderate sedation was initiated. Skin sites marked, prepped with chlorhexidine , and draped in usual sterile fashion, and infiltrated locally with 1% lidocaine . Under CT fluoroscopic guidance, a 22-gauge Chiba needle was advanced just superior to the origin of the celiac axis, to the left of midline. Diagnostic injection of 5 ml dilute Omnipaque -180 shows good spread in the region of the celiac plexus to the left of midline. In similar fashion, a 22 gauge Chiba needle was advanced to the level of the celiac plexus to the right of midline. Test injection of 5 mL dilute Omnipaque  180 shows good spread. Subsequently 10mL of Sensorcaine  0.5% were then administered, divided evenly between the 2 sites, and the needle was removed. Followup scanning shows dilution and spread of the previous administered contrast bilaterally in the expected region of the celiac plexus. No evidence of hemorrhage or other apparent complication. Patient tolerated the procedure well. IMPRESSION: 1. Technically successful celiac plexus block  under CT guidance. PLAN: If the patient has excellent pain response, and subsequent recurrence of pain, we could proceed with permanent celiac neurolysis. Electronically Signed   By: JONETTA Faes M.D.   On: 01/16/2024 16:28   CT ABDOMEN PELVIS W CONTRAST Result Date: 01/15/2024 CLINICAL DATA:  Abdominal pain with nausea.  J tube feeding. EXAM: CT ABDOMEN AND PELVIS WITH CONTRAST TECHNIQUE: Multidetector CT imaging of the abdomen and pelvis was performed using the standard protocol following bolus administration of intravenous  contrast. RADIATION DOSE REDUCTION: This exam was performed according to the departmental dose-optimization program which includes automated exposure control, adjustment of the mA and/or kV according to patient size and/or use of iterative reconstruction technique. CONTRAST:  OMNIPAQUE  IOHEXOL  300 MG/ML  SOLN COMPARISON:  CT abdomen and pelvis 12/29/2023. FINDINGS: Lower chest: There is atelectasis in the lung bases. There are small bilateral pleural effusions, right greater than left. Hepatobiliary: No focal liver abnormality is seen. No gallstones, gallbladder wall thickening, or biliary dilatation. Pancreas: Fullness of the humeral head appears unchanged from prior. Pancreas otherwise appears within normal limits. Spleen: Normal in size without focal abnormality. Adrenals/Urinary Tract: Subcentimeter cysts are identified in the left kidney. There is mild right-sided hydronephrosis without obstructing calculus identified. There is no left-sided  hydronephrosis. The adrenal glands and bladder are within normal limits. The bladder is distended. Stomach/Bowel: The appendix appears normal. There is no free intraperitoneal air. There is diffuse colonic diverticulosis. Percutaneous jejunostomy tube in place. There is some mildly dilated jejunal loop centrally without transition point. Contrast is seen beyond dilated small bowel. Percutaneous gastrostomy tube in place. Stomach is nondilated. Vascular/Lymphatic: No significant vascular findings are present. No enlarged abdominal or pelvic lymph nodes. Reproductive: Status post hysterectomy. No adnexal masses. Other: There is a small amount of free fluid in the abdomen and pelvis. There is some air within the anterior right abdominal wall. There is diffuse body wall edema. Musculoskeletal: Sclerotic osseous metastatic disease and chronic compression fracture of L3 appear unchanged. IMPRESSION: 1. Percutaneous jejunostomy tube in place. There is some mildly dilated  jejunal loop centrally without transition point. Findings may represent ileus or partial small bowel obstruction. 2. Small amount of free fluid in the abdomen and pelvis. 3. Small bilateral pleural effusions, right greater than left. 4. Diffuse body wall edema. 5. Mild right-sided hydronephrosis without obstructing calculus identified. 6. Distended bladder. 7. Colonic diverticulosis. 8. Sclerotic osseous metastatic disease. Electronically Signed   By: Greig Pique M.D.   On: 01/15/2024 19:45   DG Abd Portable 1V Result Date: 01/09/2024 CLINICAL DATA:  Vomiting for the past 2 days. EXAM: PORTABLE ABDOMEN - 1 VIEW COMPARISON:  01/08/2024 FINDINGS: Normal bowel-gas pattern. Mild-to-moderate lumbar spine degenerative changes. Postsurgical changes in the left mid and upper abdomen. IMPRESSION: No acute abnormality. Electronically Signed   By: Elspeth Bathe M.D.   On: 01/09/2024 10:18   DG Abd Portable 1V Result Date: 01/08/2024 CLINICAL DATA:  Ileus EXAM: PORTABLE ABDOMEN - 1 VIEW COMPARISON:  Upper GI 01/06/2024.  CT 12/29/2023 FINDINGS: Gas is seen in nondilated loops of large bowel. Scattered stool. Air towards the rectum. Minimal small bowel gas. Nonspecific. No obstruction. No obvious free air on this portable supine radiograph. Of note the diaphragm is clipped off the edge of the film. Degenerative changes along the spine. IMPRESSION: Nonspecific bowel gas pattern.  Overall minimal small bowel gas. Electronically Signed   By: Ranell Bring M.D.   On: 01/08/2024 10:27   DG UGI W SINGLE CM (SOL OR THIN BA) Result Date: 01/06/2024 CLINICAL DATA:  53 year old female with a history of metastatic breast cancer who presented to the ED with several days of nausea and vomiting. Imaging revealed duodenal obstruction from mass-like area of the pancreas. Patient is currently 4 days s/p laparoscopic creation of gastrojejunostomy with continued nausea and vomiting. Request for UGI. EXAM: DG UGI W SINGLE CM TECHNIQUE:  Single contrast examination was then performed using thin liquid barium. This exam was performed by [facility] PA-C, and was supervised and interpreted by Dr. Newell Eke. FLUOROSCOPY: Radiation Exposure Index (as provided by the fluoroscopic device): 33.9 mGy Kerma COMPARISON:  CT A/P W CONTRAST - 12/29/23. FINDINGS: Esophagus:  Grossly normal. Esophageal motility:  Within normal limits. Gastroesophageal reflux:  None visualized. Stomach: Gastrojejunostomy without leak. Overall poor opacification of the stomach with further assessment limited by limited contrast ingestion. Gastric emptying: Not assessed. Duodenum:  Surgically absent.  Gastrojejunostomy. Other:  None. IMPRESSION: Expected postoperative changes after gastrojejunostomy. No evidence of contrast leak or extravasation. Electronically Signed   By: Newell Eke M.D.   On: 01/06/2024 13:09    Labs:  CBC: Recent Labs    01/31/24 1147 02/01/24 1826 02/02/24 0615 02/02/24 1041 02/03/24 0337  WBC 4.1 4.2 4.0  --  4.4  HGB 8.4* 8.4* 6.7* 7.9* 8.7*  HCT 25.3* 26.3* 21.6* 25.2* 26.4*  PLT 176 173 146*  --  122*    COAGS: Recent Labs    12/10/23 2004 12/22/23 2233 02/01/24 1826 02/02/24 0615 02/03/24 0337  INR 1.1 1.2 1.2 1.2 1.2  APTT 28  --   --   --   --     BMP: Recent Labs    01/31/24 1147 02/01/24 1826 02/02/24 0615 02/03/24 0337  NA 130* 134* 139 137  K 2.6* 2.6* 3.7 3.6  CL 83* 90* 107 102  CO2 34* 29 25 25   GLUCOSE 129* 126* 94 93  BUN 11 7 5* <5*  CALCIUM  6.3* 7.1* 6.9* 7.1*  CREATININE 0.95 0.76 0.61 0.49  GFRNONAA >60 >60 >60 >60    LIVER FUNCTION TESTS: Recent Labs    01/25/24 0949 01/31/24 1147 02/01/24 1826 02/02/24 0615  BILITOT 0.6 0.7 0.6 0.8  AST 50* 43* 46* 74*  ALT 76* 62* 66* 74*  ALKPHOS 888* 642* 540* 518*  PROT 7.8 7.3 6.6 5.4*  ALBUMIN  4.1 3.7 2.9* 2.3*    TUMOR MARKERS: No results for input(s): AFPTM, CEA, CA199, CHROMGRNA in the last 8760  hours.  Assessment and Plan: Metastatis breast cancer Small bowel obstruction, pathologic? Peritoneal carcinomatosis Patient with complex medical status in the setting of progression of metastatic disease vs. New pathology.  She is currently NPO on TPN due to ongoing nausea, vomiting, abdominal pain.  CT shows concern for new peritoneal carcinomatosis.  IR consulted for biopsy at the request of Dr. Royal.  Case reviewed and approved by Dr. Aureliano.   Leaking around G-tube reported on admission, malpositioned J-tube by imaging G-tube was replaced and upsized at bedside yesterday from 24 to 28Fr balloon-retention tube.  J-tube will need to exchanged over the wire due to retraction into the soft tissue.  Plans made to perform in IR in combination with biopsy.   Discussed all the above with patient at bedside who is agreeable to proceed.   Risks and benefits of biopsy was discussed with the patient and/or patient's family including, but not limited to bleeding, infection, damage to adjacent structures or low yield requiring additional tests.  All of the questions were answered and there is agreement to proceed.  Consent signed and in chart.    Thank you for this interesting consult.  I greatly enjoyed meeting MODEAN MCCULLUM and look forward to participating in their care.  A copy of this report was sent to the requesting provider on this date.  Electronically Signed: Zackary Mckeone Sue-Ellen Aubria Vanecek, PA 02/03/2024, 10:05 AM   I spent a total of 40 Minutes    in face to face in clinical consultation, greater than 50% of which was counseling/coordinating care for peritoneal carcinomatosis, malpositioned J-tube.

## 2024-02-03 NOTE — Progress Notes (Signed)
 ONCOLOGY Patient just returned to her room after undergoing a peritoneal biopsy. Her om and dad and her sister were in the room.    02/03/2024   11:35 AM 02/03/2024   11:30 AM 02/03/2024   11:25 AM  Vitals with BMI  Systolic 115 119 871  Diastolic 74 78 77  Pulse 77 75 83      Latest Ref Rng & Units 02/03/2024    3:37 AM 02/02/2024   10:41 AM 02/02/2024    6:15 AM  CBC  WBC 4.0 - 10.5 K/uL 4.4   4.0   Hemoglobin 12.0 - 15.0 g/dL 8.7  7.9  6.7   Hematocrit 36.0 - 46.0 % 26.4  25.2  21.6   Platelets 150 - 400 K/uL 122   146       Latest Ref Rng & Units 02/03/2024    3:37 AM 02/02/2024    6:15 AM 02/01/2024    6:26 PM  CMP  Glucose 70 - 99 mg/dL 93  94  873   BUN 6 - 20 mg/dL 5  5  7    Creatinine 0.44 - 1.00 mg/dL 9.50  9.38  9.23   Sodium 135 - 145 mmol/L 137  139  134   Potassium 3.5 - 5.1 mmol/L 3.6  3.7  2.6   Chloride 98 - 111 mmol/L 102  107  90   CO2 22 - 32 mmol/L 25  25  29    Calcium  8.9 - 10.3 mg/dL 7.1  6.9  7.1   Total Protein 6.5 - 8.1 g/dL  5.4  6.6   Total Bilirubin 0.0 - 1.2 mg/dL  0.8  0.6   Alkaline Phos 38 - 126 U/L  518  540   AST 15 - 41 U/L  74  46   ALT 0 - 44 U/L  74  66    Metastatic carcinoma with peritoneal carcinomatosis: I discussed the radiology finding and discussed the possibility that this could be metastatic pancreatic cancer vs metastatic breast cancer. If it is met pancreatic cancer she has poor prognosis with a short life expectancy. Generally chemo is harsh and she likely may not be able to handle chemo. She enquired if surgery ir radiation were options and I informed her that would be the case. She reiterated her intention to try chemo even if it means potentially serious complications and even shortening her life if such arise. Plan: wait for pathology report and then consider systemic chemo as outpatient. Leakage from abdominal tubes: continues to be a concern. Poor nutrition and electrolyte imbalances remain major issues.

## 2024-02-03 NOTE — TOC Progression Note (Signed)
 Transition of Care [facility]) - Progression Note    Patient Details  Name: Chelsea Hansen MRN: [id number] Date of Birth: [birth year]  [facility] CM/SW Contact  Nola Devere Hands, RN Phone Number: 02/03/2024, 1:33 PM  Clinical Narrative:    Patient had replacement of GJ tube on 01/20/24. MD has discussed over condition and has suggested Palliative to consult for goals of care, patient wants aggressive treatment. TOC will continue to Follow. CM received a call from Holley Herring RN stating that [facility] is following patient for Tube feed needs.                      Expected Discharge Plan and Services                                               Social Drivers of Health (SDOH) Interventions SDOH Screenings   Food Insecurity: No Food Insecurity (02/02/2024)  Recent Concern: Food Insecurity - Food Insecurity Present (11/24/2023)  Housing: Low Risk  (02/02/2024)  Transportation Needs: No Transportation Needs (02/02/2024)  Utilities: Not At Risk (02/02/2024)  Depression (PHQ2-9): Medium Risk (01/25/2024)  Financial Resource Strain: High Risk (01/14/2022)  Physical Activity: Insufficiently Active (08/01/2023)  Stress: Stress Concern Present (10/10/2023)  Tobacco Use: Low Risk  (02/02/2024)  Recent Concern: Tobacco Use - High Risk (01/27/2024)   Received from [facility]    Readmission Risk Interventions    12/07/2023   12:21 PM 11/26/2023    2:58 PM  Readmission Risk Prevention Plan  Transportation Screening Complete Complete  PCP or Specialist Appt within 3-5 Days  Complete  HRI or Home Care Consult  Complete  Social Work Consult for Recovery Care Planning/Counseling  Complete  Palliative Care Screening  Not Applicable  Medication Review [employer]) Complete Complete  PCP or Specialist appointment within 3-5 days of discharge Complete   HRI or Home Care Consult Complete   SW Recovery Care/Counseling Consult Complete   Palliative Care  Screening Not Applicable   Skilled Nursing Facility Not Applicable

## 2024-02-03 NOTE — Progress Notes (Signed)
 Progress Note: General Surgery Service   Chief Complaint/Subjective: J tube adjusted, Doing well with family at bedside today  Objective: Vital signs in last 24 hours: Temp:  [98 F (36.7 C)-99.5 F (37.5 C)] 98.7 F (37.1 C) (08/15 0824) Pulse Rate:  [71-105] 77 (08/15 1135) Resp:  [16-21] 19 (08/15 1135) BP: (95-130)/(61-81) 115/74 (08/15 1135) SpO2:  [97 %-100 %] 100 % (08/15 1135) Last BM Date : 02/02/24 (per patient)  Intake/Output from previous day: 08/14 0701 - 08/15 0700 In: 1371.9 [P.O.:290; I.V.:50; Blood:850.4; IV Piggyback:181.5] Out: -  Intake/Output this shift: Total I/O In: 10 [Other:10] Out: -    GI: Abd G tube and J tube in place with drainage around tubes  Lab Results: CBC  Recent Labs    02/02/24 0615 02/02/24 1041 02/03/24 0337  WBC 4.0  --  4.4  HGB 6.7* 7.9* 8.7*  HCT 21.6* 25.2* 26.4*  PLT 146*  --  122*   BMET Recent Labs    02/02/24 0615 02/03/24 0337  NA 139 137  K 3.7 3.6  CL 107 102  CO2 25 25  GLUCOSE 94 93  BUN 5* <5*  CREATININE 0.61 0.49  CALCIUM  6.9* 7.1*   PT/INR Recent Labs    02/02/24 0615 02/03/24 0337  LABPROT 15.8* 15.8*  INR 1.2 1.2   ABG No results for input(s): PHART, HCO3 in the last 72 hours.  Invalid input(s): PCO2, PO2  Anti-infectives: Anti-infectives (From admission, onward)    None       Medications: Scheduled Meds:  sodium chloride    Intravenous Once   acetaminophen   650 mg Per Tube Once   Chlorhexidine  Gluconate Cloth  6 each Topical Daily   diphenhydrAMINE   25 mg Per Tube Once   feeding supplement (VITAL 1.5 CAL)  1,000 mL Per Tube Q24H   gabapentin   300 mg Oral TID   heparin   5,000 Units Subcutaneous Q8H   morphine    Intravenous Q4H   pantoprazole   40 mg Oral BID   polyethylene glycol  17 g Oral BID   senna  2 tablet Oral BID   Continuous Infusions:  0.9 % NaCl with KCl 40 mEq / L 70 mL/hr at 02/02/24 1943   0.9 % NaCl with KCl 40 mEq / L     TPN ADULT (ION) 30  mL/hr at 02/02/24 1816   TPN ADULT (ION)     PRN Meds:.hydrALAZINE , hydrOXYzine , morphine  injection, naloxone  **AND** sodium chloride  flush, ondansetron  (ZOFRAN ) IV, oxyCODONE -acetaminophen , prochlorperazine , sodium chloride  flush  Assessment/Plan: Ms. Hodkinson is a 53 year old lady with a duodenal obstruction from a pancreatic head mass, status post gastrojejunostomy and subsequent G tube and J tube when GJ was not draining.   G tube vent as needed for nausea/vomiting J tube to trickle tube feeds PICC TPN till adequate enteral nutrition Monitor for refeeding syndrome Follow up IR biopsy for tissue diagnosis of cancer - metastatic breast vs. Pancreatic?  This will help oncology team direct treatment options as patient would like to be aggressive with care plan     LOS: 2 days   Deward JINNY Foy, MD  [facility] Use [email or web address] to contact on call provider  Daily Billing: [phone] - post op

## 2024-02-03 NOTE — Progress Notes (Signed)
 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 Lyndel on 8/8 with discomfort irritation around the tube site with gauze and has not started tube feeds   8/12 was seen by nurse practitioner with palliative care --[name] with significant abdominal pain nausea despite oxycodone  7.5 every 4 as needed-gabapentin  was increased to iii/day also found to be hypotensive  Ultimately admitted after labs resulted showing significant abnormalities of electrolytes General surgeon consulted regarding management etc.On admission 8/14 consulted oncology, consulted IR- transfused 2 units PRBC  CT ABD pelvis pancreatic head mass not well-visualized progressive extrahepatic biliary dilatation abrupt cut off pancreatic head-progressive moderate hydronephrosis proximal hydroureter no intrarenal calculi identified?  Intrinsic stricture-progressive peritoneal carcinomatosis-interval mild ascites-percutaneous jejunostomy catheter in place but balloon has been withdrawn within the tract of the anterior abdominal wall--stable osseous metastatic disease with pathological fracture L3 and mid loss of height-diverticulosis without diverticulitis  8/15 attempt was made at upsize of jejunostomy tube to 24 [place] under fluoroscopy  Peritoneal biopsy performed by  IR  Plan  Malfunction of G-tube/J-tube  Underlying pancreatic malignancy now with peritoneal carcinomatosis no stool in a week but passing some gas--Enema in am J still leaking and macerating the skin-defer to general surgery who is aware of the same--- cannot start parenteral nutrition via J-tube yet so allowing diet per surgery We will be placing Port-a-cath as able per IR after discussions with Oncologist this pm  Abdominal pain secondary to 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 Half-dose  Morphine  PCA started to determine overall total needs  continue gabapentin  300 3 times daily Continue Protonix  40 twice daily  Enema in am--no stool ~ 1 week  Pancreatic mass duodenal stricture JG bypass 7/14--likely pancreatic malignancy See above details Peritoneal biopsy performed 8/15 await results   Underlying prior history of breast cancer with mets to the bone  Anemia + thrombocytopenia likely of malignancy Hemoglobin improved--watch platelets---  Multiple lab electrolyte abnormalities Hyponatremia-corrected with saline  hypokalemia and hypomagnesemia-corrected with NS +40 KCl at 75 cc/H --- received 2 rounds of K this morning Hypocalcemia-I gm calcium  given    Hypotension previously Holding propranolol  10 mg twice daily for now--PRNs in place     See detailed discussion of consultants inclusive of oncology  Data Reviewed:  Sodium 137 potassium 3.6 chloride 102 BUN/creatinine 5/0.4 anion gap 10 phosphorus 3.7 magnesium  1.9 calcium  7.1   DVT prophylaxis: Heparin   Status is: Inpatient Remains inpatient appropriate because:   Requires further management   Subjective:  PICC line aborted as couldn't get this done depsite trial per PICC Rn She is still having maceration of Jejunostomy and cannot use it--Gen surg will address in am Pain is better on PCA    Objective + exam Vitals:   02/03/24 1125 02/03/24 1130 02/03/24 1135 02/03/24 1540  BP: 128/77 119/78 115/74    Pulse: 83 75 77   Resp: (!) 21 20 19 19   Temp:      TempSrc:      SpO2: 100% 100% 100%   Weight:      Height:       Filed Weights   02/02/24 1059  Weight: 80 kg   Examination:  Coherent in nad  No ict Abd distended--JJ tube some leakage No Le edema S1 s2 no m Neuro grossly intact   Scheduled Meds:  sodium chloride    Intravenous Once   acetaminophen   650 mg Per Tube Once   Chlorhexidine  Gluconate Cloth  6 each Topical Daily   diphenhydrAMINE   25 mg Per Tube Once   feeding supplement (VITAL 1.5 CAL)  1,000 mL Per Tube Q24H   gabapentin   300 mg Oral TID   heparin   5,000 Units Subcutaneous Q8H   [START ON 02/04/2024] lactulose   300 mL Rectal Once   morphine    Intravenous Q4H   pantoprazole   40 mg Oral BID   polyethylene glycol  17 g Oral BID   senna  2 tablet Oral BID   Continuous Infusions:  0.9 % NaCl with KCl 40 mEq / L     TPN ADULT (ION)      Time 35  Colen Grimes, MD  [facility]

## 2024-02-03 NOTE — Progress Notes (Addendum)
 Nutrition Brief Note  Pt went down to IR for J-tube repositioning. OK to use tube for tube feeds per radiology. Will start trickle tube feeds and MD can advance as pt tolerates to goal. Once pt tolerates tube feeds at goal recommend weaning TPN. However if pt cannot tolerate tube feeds at goal, TPN to continue to meet 100% of estimated needs. Of note pt was never able to tolerate tube feeds at goal on prior admission.   INTERVENTION:  TPN per pharmacy Pt is at severe refeeding risk, titrate TPN to goal of: Kcal: 2000-2300 kcal Protein: 100-115 gm protein  Fluid: >/=2L/day  Add 100 mg Thiamine  and MVI to TPN Per pharmacy: Start TPN at 74mL/hr at 1800  Goal TPN rate is 90 mL/hr (provides 105 g of protein and 2185 kcals per day)    Once J-tube is adjusted by IR and J-tube is safe to use recommend:  Vital 1.5 @ 10 ml/hr and advance by 10 ml/hr as tolerated until goal of 60 ml/hr (1440 ml per day) Prosource TF20 60 ml daily   At goal provides 2,240 kcal, 117 gm protein,1100 ml water    *If pt cannot tolerate tube feeds at goal and if tubes keep being mispositioned may need long term TPN if within GOC.   Olivia Kenning, RD Registered Dietitian  See Amion for more information

## 2024-02-03 NOTE — Procedures (Signed)
 Vascular and Interventional Radiology Procedure Note  Patient: Chelsea Hansen DOB: 17-Nov-1970 Medical Record Number: [id number] Note Date/Time: 02/03/24 12:21 PM   Performing Physician: Thom Hall, MD Assistant(s): None  Diagnosis: Leaking catheter. Surgical J-tube  Procedure: JEJUNOSTOMY TUBE EXCHANGE and UPSIZE  Anesthesia: Local Anesthetic Complications: None Estimated Blood Loss: 0 mL  Findings:  Successful exchange and upsize from 22 F to a 16F jejunostomy tube under fluoroscopy.   See detailed procedure note with images in PACS. The patient tolerated the procedure well without incident or complication and was returned to Recovery in stable condition.    Thom Hall, MD [facility] Radiology   Pager. [phone] Clinic. [phone]

## 2024-02-03 NOTE — Progress Notes (Signed)
 At bedside for PICC exchange ; PICC not flushable . PICC cath unable to pass clavicular region.Tried using brachial vein and cath ,still will not pass clavicular area despite of all interventions.Dr Samtani at bedside aware and order for IR to attempt placement.

## 2024-02-03 NOTE — Progress Notes (Signed)
 PHARMACY - TOTAL PARENTERAL NUTRITION CONSULT NOTE   Indication: obstructing pancreatic cancer  Patient Measurements: Height: 5' 11 (180.3 cm) Weight: 80 kg (176 lb 6.4 oz) IBW/kg (Calculated) : 70.8 TPN AdjBW (KG): 80 Body mass index is 24.6 kg/m. Usual Weight: 205 lb (93.2 kg)  Assessment: 52 yof with metastatic breast cancer to bone with recent admission from 7/3 > 7/29 where she received TPN before leaving AMA. Also with pancreatic mass with duodenal stricture/SBO with unsuccessful EUS secondary to duodenal stenosis x 2. Underwent GJ bypass on 7/14 then Gtube and Jtube placement on 7/22. She is admitted with malfunction Gtube/Jtube and worsening cancer (pancreatic malignancy with peritoneal carcinomatosis). She is high risk for refeeding syndrome.  Per RD, patient has been able to tolerate soft foods such as ramen noodles soups, and liquids. Some food has been coming out of her Gtube. Patient does not remember if she has been losing weight but has been getting weaker. There is conflicting information between patient and PO intake charted.  Glucose / Insulin : no hx DM - last admit SSI d/c so will keep off for now Electrolytes: K 3.6, CoCa 8.5 s/p 1g Ca, Phos up 3.7, Mg 1.9 Renal: SCr 0.49, BUN <5 Hepatic: TG 105, albumin  2.3, AST/ALT up 74/74, alk Phos down 518 Intake / Output; MIVF: NS+40K at 70 ml/hr, stool x1 8/14  GI Imaging: 7/4 MR abdomen MRCP: pancreatic mass, vertebral metastases, pancolonic diverticulosis 7/10 CT: pancreatic head equivocal fullness, small amount free pelvic fluid, widespread osseous metastatic disease, small hiatal hernia, colonic diverticulosis 8/14: CT scan showed progressive peritoneal carcinomatosis, interval development of mild ascites, progressive extrahepatic biliary ductal dilatation, progressive moderate right hydronephrosis without obvious calculi, retracted retention balloon of jejunostomy tube, developing encasement of IVC.  GI Surgeries /  Procedures:  7/8 EGD 7/7 EUS, upper GI tract 7/14: bypass surgery for creation of G-Junostomy 7/22: Insertion of gastrostomy tube 7/27: CT A/P w/ oral contrast via G/J tube: mildly dilated jejunal loop centrally. Contrast is seen beyond dilated small bowel. may rep ileus or pSBO 7/28 celiac plexus block 7/29 EGD 8/14 Gtube exchange  Central access: PICC placed 02/02/24 TPN start date: 12/28/23 >> 01/17/24, 02/02/24 >>  Nutritional Goals: Goal TPN rate is 90 mL/hr (provides 105 g of protein and 2185 kcals per day)  RD Assessment: Estimated Needs Total Energy Estimated Needs: 2000-2300 kcal/day Total Protein Estimated Needs: 100-115 gm/day Total Fluid Estimated Needs: >/=2L  Current Nutrition:  NPO and TPN  Plan:  Increase TPN to 64mL/hr at 1800 (provides 70 g of protein and 1457 kcals per day meeting ~70% needs) Electrolytes in TPN: Na 56mEq/L, K 65mEq/L, Ca 66mEq/L, Mg 57mEq/L, and Phos 15mmol/L. Cl:Ac 1:1 Add standard MVI and trace elements to TPN Add thiamine  100 mg to TPN for 5 days (8/14 >> 8/18) Reduce MIVF to 40 mL/hr at 1800 KCl 10 mEq IV x2 Calcium  gluconate 1 g IV x1 Monitor TPN labs on Mon/Thurs, daily x3 or until stable Jtube adjustment and peritoneal biopsy planned for today  Thank you for involving pharmacy in this patient's care.  Delon Sax, PharmD, BCPS Clinical Pharmacist Clinical phone for 02/03/2024 is [phone] 02/03/2024 7:01 AM

## 2024-02-03 NOTE — Procedures (Signed)
 Vascular and Interventional Radiology Procedure Note  Patient: Chelsea Hansen DOB: 01/30/1971 Medical Record Number: [id number] Note Date/Time: 02/03/24 11:07 AM   Performing Physician: Thom Hall, MD Assistant(s): None  Diagnosis: Hx breast CA. Peritoneal mass   Procedure: PERITONEAL MASS BIOPSY  Anesthesia: Conscious Sedation Complications: None Estimated Blood Loss: Minimal Specimens: Sent for Pathology  Findings:  Successful CT-guided biopsy of peritoneal mass . A total of 3 samples were obtained. Hemostasis of the tract was achieved using Manual Pressure.  Plan: Bed rest for 1 hours.  See detailed procedure note with images in PACS. The patient tolerated the procedure well without incident or complication and was returned to Recovery in stable condition.    Thom Hall, MD Vascular and Interventional Radiology Specialists [facility] Radiology   Pager. [phone] Clinic. [phone]

## 2024-02-04 ENCOUNTER — Other Ambulatory Visit (HOSPITAL_COMMUNITY): Payer: Self-pay

## 2024-02-04 DIAGNOSIS — K56609 Unspecified intestinal obstruction, unspecified as to partial versus complete obstruction: Secondary | ICD-10-CM | POA: Diagnosis not present

## 2024-02-04 LAB — URINALYSIS, COMPLETE (UACMP) WITH MICROSCOPIC
Bacteria, UA: NONE SEEN
Bilirubin Urine: NEGATIVE
Glucose, UA: NEGATIVE mg/dL
Hgb urine dipstick: NEGATIVE
Ketones, ur: NEGATIVE mg/dL
Leukocytes,Ua: NEGATIVE
Nitrite: NEGATIVE
Protein, ur: NEGATIVE mg/dL
Specific Gravity, Urine: 1.008 (ref 1.005–1.030)
pH: 7 (ref 5.0–8.0)

## 2024-02-04 LAB — COMPREHENSIVE METABOLIC PANEL WITH GFR
ALT: 96 U/L — ABNORMAL HIGH (ref 0–44)
AST: 55 U/L — ABNORMAL HIGH (ref 15–41)
Albumin: 2.6 g/dL — ABNORMAL LOW (ref 3.5–5.0)
Alkaline Phosphatase: 603 U/L — ABNORMAL HIGH (ref 38–126)
Anion gap: 11 (ref 5–15)
BUN: <5 mg/dL — ABNORMAL LOW (ref 6–20)
CO2: 21 mmol/L — ABNORMAL LOW (ref 22–32)
Calcium: 7.3 mg/dL — ABNORMAL LOW (ref 8.9–10.3)
Chloride: 105 mmol/L (ref 98–111)
Creatinine, Ser: 0.6 mg/dL (ref 0.44–1.00)
GFR, Estimated: >60 mL/min (ref >60)
Glucose, Bld: 93 mg/dL (ref 70–99)
Potassium: 4.1 mmol/L (ref 3.5–5.1)
Sodium: 137 mmol/L (ref 135–145)
Total Bilirubin: 0.7 mg/dL (ref 0.0–1.2)
Total Protein: 6.2 g/dL — ABNORMAL LOW (ref 6.5–8.1)

## 2024-02-04 LAB — CBC WITH DIFFERENTIAL/PLATELET
Basophils Absolute: 0.1 K/uL (ref 0.0–0.1)
Basophils Relative: 1 %
Eosinophils Absolute: 0.1 K/uL (ref 0.0–0.5)
Eosinophils Relative: 2 %
HCT: 31.7 % — ABNORMAL LOW (ref 36.0–46.0)
Hemoglobin: 10.2 g/dL — ABNORMAL LOW (ref 12.0–15.0)
Lymphocytes Relative: 25 %
Lymphs Abs: 1.4 K/uL (ref 0.7–4.0)
MCH: 25.6 pg — ABNORMAL LOW (ref 26.0–34.0)
MCHC: 32.2 g/dL (ref 30.0–36.0)
MCV: 79.4 fL — ABNORMAL LOW (ref 80.0–100.0)
Monocytes Absolute: 0.3 K/uL (ref 0.1–1.0)
Monocytes Relative: 6 %
Neutro Abs: 3.6 K/uL (ref 1.7–7.7)
Neutrophils Relative %: 66 %
Platelets: 118 K/uL — ABNORMAL LOW (ref 150–400)
RBC: 3.99 MIL/uL (ref 3.87–5.11)
RDW: 20.6 % — ABNORMAL HIGH (ref 11.5–15.5)
WBC: 5.4 K/uL (ref 4.0–10.5)
nRBC: 0 % (ref 0.0–0.2)

## 2024-02-04 LAB — GLUCOSE, CAPILLARY
Glucose-Capillary: 85 mg/dL (ref 70–99)
Glucose-Capillary: 90 mg/dL (ref 70–99)
Glucose-Capillary: 94 mg/dL (ref 70–99)
Glucose-Capillary: 96 mg/dL (ref 70–99)
Glucose-Capillary: 97 mg/dL (ref 70–99)
Glucose-Capillary: 99 mg/dL (ref 70–99)

## 2024-02-04 LAB — MAGNESIUM: Magnesium: 1.2 mg/dL — ABNORMAL LOW (ref 1.7–2.4)

## 2024-02-04 LAB — OCCULT BLOOD X 1 CARD TO LAB, STOOL: Fecal Occult Bld: NEGATIVE

## 2024-02-04 LAB — LACTIC ACID, PLASMA: Lactic Acid, Venous: 1 mmol/L (ref 0.5–1.9)

## 2024-02-04 LAB — PHOSPHORUS: Phosphorus: 3.2 mg/dL (ref 2.5–4.6)

## 2024-02-04 LAB — CANCER ANTIGEN 19-9: CA 19-9: 158 U/mL — ABNORMAL HIGH (ref 0–35)

## 2024-02-04 MED ORDER — KCL IN DEXTROSE-NACL 40-5-0.9 MEQ/L-%-% IV SOLN
INTRAVENOUS | Status: DC
  Filled 2024-02-04: qty 1000

## 2024-02-04 MED ORDER — MAGNESIUM SULFATE 4 GM/100ML IV SOLN
4.0000 g | Freq: Once | INTRAVENOUS | Status: AC
  Administered 2024-02-04: 4 g via INTRAVENOUS
  Filled 2024-02-04: qty 100

## 2024-02-04 MED ORDER — ZINC OXIDE 40 % EX OINT
TOPICAL_OINTMENT | Freq: Two times a day (BID) | CUTANEOUS | Status: DC
  Filled 2024-02-04 (×2): qty 57

## 2024-02-04 MED ORDER — ENSURE MAX PROTEIN PO LIQD
11.0000 [oz_av] | Freq: Every day | ORAL | Status: DC
  Administered 2024-02-04 – 2024-02-05 (×2): 11 [oz_av] via ORAL
  Filled 2024-02-04 (×3): qty 330

## 2024-02-04 MED ORDER — SODIUM CHLORIDE 0.9 % IV SOLN: INTRAVENOUS | Status: DC

## 2024-02-04 MED ORDER — CALCIUM GLUCONATE-NACL 1-0.675 GM/50ML-% IV SOLN
1.0000 g | Freq: Once | INTRAVENOUS | Status: AC
  Administered 2024-02-04: 1000 mg via INTRAVENOUS
  Filled 2024-02-04: qty 50

## 2024-02-04 NOTE — Progress Notes (Signed)
 Assessment & Plan: 53 yo Chelsea Hansen - duodenal obstruction from a pancreatic head mass, carcinomatosis - status post gastrojejunostomy, G tube, J tube - G tube vent as needed for nausea/vomiting - J tube with drainage - will deflate balloon which may be causing obstruction - discussed with nurse - PICC for TPN - medical oncology consult to establish goals of care  Will follow        Krystal Spinner, MD [facility] practice Office: [phone]        Chief Complaint: Duodenal obstruction, carcinomatosis  Subjective: Patient in bed, responsive  Objective: Vital signs in last 24 hours: Temp:  [98.7 F (37.1 C)-101.8 F (38.8 C)] 98.7 F (37.1 C) (08/16 0743) Pulse Rate:  [60-106] 86 (08/16 0743) Resp:  [15-21] 15 (08/16 0743) BP: (106-141)/(72-81) 106/72 (08/16 0743) SpO2:  [98 %-100 %] 98 % (08/16 0743) Weight:  [81 kg] 81 kg (08/16 0433) Last BM Date : 02/03/24  Intake/Output from previous day: 08/15 0701 - 08/16 0700 In: 250 [P.O.:240] Out: 150 [Drains:150] Intake/Output this shift: No intake/output data recorded.  Physical Exam: HEENT - sclerae clear, mucous membranes moist Abdomen - soft, G-tube capped with small bilious; J-tube pouched with thin succus in bag  Lab Results:  Recent Labs    02/02/24 0615 02/02/24 1041 02/03/24 0337  WBC 4.0  --  4.4  HGB 6.7* 7.9* 8.7*  HCT 21.6* 25.2* 26.4*  PLT 146*  --  122*   BMET Recent Labs    02/02/24 0615 02/03/24 0337  NA 139 137  K 3.7 3.6  CL 107 102  CO2 25 25  GLUCOSE 94 93  BUN 5* <5*  CREATININE 0.61 0.49  CALCIUM  6.9* 7.1*   PT/INR Recent Labs    02/02/24 0615 02/03/24 0337  LABPROT 15.8* 15.8*  INR 1.2 1.2   Comprehensive Metabolic Panel:    Component Value Date/Time   NA 137 02/03/2024 0337   NA 139 02/02/2024 0615   NA 144 11/30/2023 1000   NA 139 10/06/2020 1432   K 3.6 02/03/2024 0337   K 3.7 02/02/2024 0615   CL 102 02/03/2024 0337   CL 107  02/02/2024 0615   CO2 25 02/03/2024 0337   CO2 25 02/02/2024 0615   BUN <5 (L) 02/03/2024 0337   BUN 5 (L) 02/02/2024 0615   BUN 19 11/30/2023 1000   BUN 11 10/06/2020 1432   CREATININE 0.49 02/03/2024 0337   CREATININE 0.61 02/02/2024 0615   CREATININE 0.95 01/31/2024 1147   CREATININE 0.68 01/25/2024 0949   GLUCOSE 93 02/03/2024 0337   GLUCOSE 94 02/02/2024 0615   CALCIUM  7.1 (L) 02/03/2024 0337   CALCIUM  6.9 (L) 02/02/2024 0615   AST 74 (H) 02/02/2024 0615   AST 46 (H) 02/01/2024 1826   AST 43 (H) 01/31/2024 1147   AST 50 (H) 01/25/2024 0949   ALT 74 (H) 02/02/2024 0615   ALT 66 (H) 02/01/2024 1826   ALT 62 (H) 01/31/2024 1147   ALT 76 (H) 01/25/2024 0949   ALKPHOS 518 (H) 02/02/2024 0615   ALKPHOS 540 (H) 02/01/2024 1826   BILITOT 0.8 02/02/2024 0615   BILITOT 0.6 02/01/2024 1826   BILITOT 0.7 01/31/2024 1147   BILITOT 0.6 01/25/2024 0949   PROT 5.4 (L) 02/02/2024 0615   PROT 6.6 02/01/2024 1826   PROT 7.3 11/30/2023 1000   PROT 6.9 10/06/2020 1432   ALBUMIN  2.3 (L) 02/02/2024 0615   ALBUMIN  2.9 (L) 02/01/2024 1826  ALBUMIN  4.5 11/30/2023 1000   ALBUMIN  4.1 10/06/2020 1432    Studies/Results: IR Replc Duoden/Jejuno Tube Percut W/Fluoro Result Date: 02/03/2024 INDICATION: Leaking catheter. History of pancreatic mass with duodenal ureteral stricture/obstruction. Surgical gastrostomy and jejunostomy tubes placed 01/10/2024. Reported J-tube leakage. EXAM: JEJUNOSTOMY TUBE EXCHANGE and UPSIZE COMPARISON:  KUB, 02/02/2024. CT AP, 02/02/2024. IR fluoroscopy, 01/20/2024. MEDICATIONS: None CONTRAST:  20mL OMNIPAQUE  IOHEXOL  300 MG/ML  SOLN FLUOROSCOPY: Radiation Exposure Index and estimated peak skin dose (PSD); Reference air kerma (RAK), 6.6 mGy. Kerma-area product (KAP), 122.96 uGy*m. COMPLICATIONS: None immediate. PROCEDURE: Informed written consent was obtained from the patient and/or patient's representative after a discussion of the risks, benefits and alternatives to  treatment. Questions regarding the procedure were encouraged and answered. A timeout was performed prior to the initiation of the procedure. The site of prior jejunostomy tube was prepped and draped in the usual sterile fashion, and a sterile drape was applied covering the operative field. Maximum barrier sterile technique with sterile gowns and gloves were used for the procedure. A timeout was performed prior to the initiation of the procedure. Contrast injection confirmed appropriate intraluminal positioning. The jejunostomy tube was cannulated with a stiff Glidewire and under intermittent fluoroscopic guidance, the catheter was exchanged for a new 24 Fr jejunostomy tube. The retention balloon was inflated and the balloon was cinched. Contrast was injected and several spot fluoroscopic images were obtained in various obliquities confirming intraluminal positioning. The patient tolerated the procedure well without immediate postprocedural complication. IMPRESSION: Successful fluoroscopic guided exchange and upsize to a 24 Fr jejunostomy tube. The new jejunostomy tube is ready for immediate use. RECOMMENDATIONS: The patient will return to Vascular Interventional Radiology (VIR) for routine feeding tube evaluation and exchange in 6 months. Thom Hall, MD Vascular and Interventional Radiology Specialists [facility] Radiology Electronically Signed   By: Thom Hall M.D.   On: 02/03/2024 15:48   US  EKG Site Rite Result Date: 02/03/2024 If Site Rite image not attached, placement could not be confirmed due to current cardiac rhythm.  CT ABDOMINAL MASS BIOPSY Result Date: 02/03/2024 INDICATION: 742006 Metastatic disease (HCC) 742006 history of breast cancer with peritoneal mass EXAM: CT-GUIDED PERITONEAL MASS BIOPSY COMPARISON:  CT AP, 02/02/2024. MEDICATIONS: 50 mg Benadryl  IV ANESTHESIA/SEDATION: Moderate (conscious) sedation was employed during this procedure. A total of Versed  2 mg and Fentanyl  100 mcg was  administered intravenously. Moderate Sedation Time: 21 minutes. The patient's level of consciousness and vital signs were monitored continuously by radiology nursing throughout the procedure under my direct supervision. CONTRAST:  None. COMPLICATIONS: None immediate. PROCEDURE: RADIATION DOSE REDUCTION: This exam was performed according to the departmental dose-optimization program which includes automated exposure control, adjustment of the mA and/or kV according to patient size and/or use of iterative reconstruction technique. Informed consent was obtained from the patient following an explanation of the procedure, risks, benefits and alternatives. A time out was performed prior to the initiation of the procedure. The patient was positioned supine on the CT table and a limited CT was performed for procedural planning demonstrating peritoneal mass at the LEFT lower quadrant. The procedure was planned. The operative site was prepped and draped in the usual sterile fashion. Appropriate trajectory was confirmed with a 22 gauge spinal needle after the adjacent tissues were anesthetized with 1% Lidocaine  with epinephrine . Under intermittent CT guidance, a 17 gauge coaxial needle was advanced into the peripheral aspect of the mass. Appropriate positioning was confirmed and 3 samples were obtained with an 18 gauge core needle biopsy device. The  co-axial needle was removed and hemostasis was achieved with manual compression. A limited postprocedural CT was negative for hemorrhage or additional complication. A dressing was placed. The patient tolerated the procedure well without immediate postprocedural complication. IMPRESSION: Successful CT guided core needle biopsy of peritoneal mass. Thom Hall, MD Vascular and Interventional Radiology Specialists [facility] Radiology Electronically Signed   By: Thom Hall M.D.   On: 02/03/2024 14:56   DG Chest Port 1 View Result Date: 02/03/2024 CLINICAL DATA:  Status post PICC  catheter insertion EXAM: PORTABLE CHEST 1 VIEW COMPARISON:  Chest radiograph February 02, 2024 FINDINGS: Cardiomediastinal silhouette is mildly accentuated due to low lung volumes. No new pulmonary opacity. Mild blunting of left costophrenic angle likely positional. Interval placement of left PICC catheter tip terminating in distal SVC. Right-sided PICC catheter is removed. No acute osseous abnormality. Surgical clips overlying the left lower chest. IMPRESSION: Left-sided PICC catheter tip terminates in distal SVC. Interval removal of right PICC catheter. Electronically Signed   By: Megan  Zare M.D.   On: 02/03/2024 14:53   IR REPLACE G-TUBE COMPLEX WO FLUORO (TRACT REV) Result Date: 02/02/2024 INDICATION: Patient with a history of pancreatic mass with duodenal stricture/obstruction. She underwent G-tube placement and J-tube placement 01/10/2024 by surgery. Patient was seen in IR January 20, 2024 and underwent G-tube exchange/upsize to a 24 [place] balloon retention catheter due to leaking. The 22 French balloon retention jejunostomy tube was repositioned. IR now asked to evaluate patient due to leaking from the gastrostomy and the jejunostomy. EXAM: Gastrostomy tube exchange MEDICATIONS: None ANESTHESIA/SEDATION: None CONTRAST:  None FLUOROSCOPY: None COMPLICATIONS: None immediate. PROCEDURE: The patient was assessed at the bedside and the gastrostomy tube tract was found to be dilated. The 24 French balloon retention gastrostomy tube was removed and exchanged for a 28 French balloon retention gastrostomy tube. Post exchange x-ray shows the gastrostomy tube to be well positioned within the gastric lumen. The jejunostomy site was also found to be enlarged and CT imaging shows the retention balloon has been retracted within the tract within the anterior abdominal wall. IMPRESSION: Technically successful bedside gastrostomy tube exchange from a 24 [place] to a 28 [place] balloon retention. Patient will come to IR for  jejunostomy exchange/reposition. Electronically Signed   By: Ester Sides M.D.   On: 02/02/2024 15:03   DG ABDOMEN PEG TUBE LOCATION Result Date: 02/02/2024 CLINICAL DATA:  Gastrostomy tube complication. EXAM: ABDOMEN - 1 VIEW COMPARISON:  January 17, 2024. FINDINGS: No abnormal bowel dilatation is noted. Distal tip of gastrostomy tube appears to be within gastric lumen with normal contrast filling of the stomach. No extravasation or leakage is noted. Small amount of contrast is noted in nondilated proximal small bowel. IMPRESSION: Gastrostomy tube tip appears to be within gastric lumen. Electronically Signed   By: Lynwood Landy Raddle M.D.   On: 02/02/2024 13:19   DG CHEST PORT 1 VIEW Result Date: 02/02/2024 CLINICAL DATA:  Status post PICC placement. EXAM: PORTABLE CHEST 1 VIEW COMPARISON:  January 03, 2024. FINDINGS: The heart size and mediastinal contours are within normal limits. Right-sided PICC line is noted with catheter misdirected into the right internal jugular vein. Both lungs are clear. The visualized skeletal structures are unremarkable. IMPRESSION: Right-sided PICC line is noted with catheter going retrograde into right internal jugular vein. Repositioning is recommended. These results will be called to the ordering clinician or representative by the Radiologist Assistant, and communication documented in the PACS or zVision Dashboard. Electronically Signed   By: Lynwood Landy  Jr M.D.   On: 02/02/2024 13:17   US  EKG SITE RITE Result Date: 02/02/2024 If Site Rite image not attached, placement could not be confirmed due to current cardiac rhythm.     Krystal Spinner 02/04/2024  Patient ID: Chelsea Hansen, Chelsea Hansen   DOB: Feb 04, 1971, 53 y.o.   MRN: [id number]

## 2024-02-04 NOTE — Consult Note (Signed)
 WOC Nurse Consult Note: Reason for Consult: excoriated area around G tube  Wound type: partial thickness wound r/t irritation and friction from G tube  Pressure Injury POA: NA  Measurement: see nursing flowsheet  Wound bed: red moist  Drainage (amount, consistency, odor) see nursing flowsheet Periwound: moisture associated skin damage  ICD-10 CM Codes for Irritant Dermatitis L24B1 - Related to digestive stoma or fistula  Dressing procedure/placement/frequency: Cleanse around G tube with NS, apply silver  hydrofiber Soila #[id number] Aquacel AG) to red moist areas daily (may cut Aquacel like a split gauze and use around G tube).  If skin is irritated from drainage may apply Desitin 2 times daily as needed.    If G tube is draining and not contained by silver  would try Drawtex superabsorbent dressing cut like a split gauze Gerlean #[id number].   POC discussed with bedside nurse. WOC team will not follow. Reconsult if further needs arise.   Thank you,    Powell Bar MSN, RN-BC, [employer]

## 2024-02-04 NOTE — Progress Notes (Signed)
 Dr. Lyndel in and removed J tube. Pouched opening with a [place] [phone] cut to fit one piece system after cleansing and applying skin prep to site which is reddened on the proximal side of the opening. Framed the pouch with rectangular foams for stability of the pouch and instructed the pt to inform staff to empty the pouch once 1/3 full so the weight of it will not pull on the pouch, creating a leak. Cleansed and redressed the G tube site and photos taken or the skin around that site which is excoriated. Foam applied to the excoriated area for protection. Will enter WOC consult for any suggestions for this area.Photos taken.

## 2024-02-04 NOTE — Progress Notes (Addendum)
 J tube retracted some with balloon deflated.  When it is in appropriate position with the balloon up, it appears to occlude the bowel.  Considered stitching it to the skin or putting the bumper under the sticker of a pouch, but tissue surrounding j-tube site is very irritated and this would cause additional trauma or be unsecure.  Patient says she is eating better, tolerated dinner and breakfast.  Gastrojejunostomy may finally be draining better.  I think the J tube is causing more issues than it is worth at this point.  Removed J tube at bedside and placed pouch over J tube site, hopefully this shuts down quickly and she can tolerate diet by mouth as it has been very difficult to provide nutrition for this patient.  G tube feeds could also be considered if inadequate PO intake.  Try soft diet with calorie count and protein shakes for now.  Deward JINNY Foy, MD General, Bariatric and Minimally Invasive Surgery [facility]

## 2024-02-04 NOTE — Progress Notes (Signed)
     Patient Name: Chelsea Hansen           DOB: [birth year]  MRN: [id number]      Admission Date: 02/01/2024  Attending Provider: Royal Sill, MD  Primary Diagnosis: SBO (small bowel obstruction) (HCC)   Level of care: Med-Surg   OVERNIGHT EVENT   New fever, 101.8 F Mildly tachycardic, all other vital stable.  Remains on room air.  No fevers reported since admission.  Prior WBC 4.4.  RN reports no acute changes.  Also, no report of chills, chest discomfort, shortness of breath, cough, sputum production, sore throat, nausea, vomiting, diarrhea, urinary changes tonight.  No abdominal changes reported. 8/15-underwent 2 IR procedures.  1) peritoneal biopsy for concerns of metastatic carcinoma with peritoneal carcinomatosis (metastatic pancreatic VS metastatic breast cancer).  2) J-tube exchange and upsize.  PICC line removed 8/15.  Chest x-ray without new pulmonary opacities,      Plan: CBC Blood cultures UA Lactic Continue incentive spirometry use Stable, closely monitor off abx. Start zosyn if WBC elevated, recurrent fever to cover for intra-abdominal source     Lavanda Horns, DNP, ACNPC- [facility]

## 2024-02-04 NOTE — Progress Notes (Signed)
 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 Lyndel on 8/8 with discomfort irritation around the tube site with gauze and has not started tube feeds   8/12 was seen by nurse practitioner with palliative care --[name] with significant abdominal pain nausea despite oxycodone  7.5 every 4 as needed-gabapentin  was increased to iii/day also found to be hypotensive  Ultimately admitted after labs resulted showing significant abnormalities of electrolytes General surgeon consulted regarding management etc.On admission 8/14 consulted oncology, consulted IR- transfused 2 units PRBC  CT ABD pelvis pancreatic head mass not well-visualized progressive extrahepatic biliary dilatation abrupt cut off pancreatic head-progressive moderate hydronephrosis proximal hydroureter no intrarenal calculi identified?  Intrinsic stricture-progressive peritoneal carcinomatosis-interval mild ascites-percutaneous jejunostomy catheter in place but balloon has been withdrawn within the tract of the anterior abdominal wall--stable osseous metastatic disease with pathological fracture L3 and mid loss of height-diverticulosis without diverticulitis  8/15 attempt was made at upsize of jejunostomy tube to 24 [place] under fluoroscopy  Peritoneal biopsy performed by IR 8/16  fever -likely spuriously, surgeon removed J-tube -trying oral diet  Plan  Spurious fever?  Overnight versus fever of malignancy Patient says the room was  hot and she was burning up because someone raise the temperature She has no clear leukocytosis, cough cold burning in the urine or other symptoms--UA in last night was negative Blood cultures were done which I guess we will follow but I am not convinced she has any infection as lactic acidosis was low  Malfunction of G-tube/J-tube  Underlying pancreatic malignancy now with peritoneal carcinomatosis no stool in a week but passing some gas--Enema in am J-tube  came out ultimately removed Will discuss with nutritionist next steps-possibly can do G-tube feeds per surgery?  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 access continue gabapentin  300 3 times daily Continue Protonix  40 twice daily   Mild hydronephrosis on CT Passing urine-needs attention with follow-up scans  Severe constipation X1 week Passed stool this morning 8/16  Pancreatic mass duodenal stricture JG bypass 7/14--likely pancreatic malignancy See above details Peritoneal biopsy performed 8/15 await results   Underlying prior history of breast cancer with mets to the bone  Anemia + thrombocytopenia likely of malignancy Hemoglobin improved--watch platelets---  Multiple lab electrolyte abnormalities Hyponatremia-corrected with saline  Hypokalemia-corrected-changed to NS 50 cc/H Hypocalcemia-I gm calcium  repeat today Hypomagnesemia 4 g magnesium  given.today    Hypotension previously Holding propranolol  10 mg twice daily for now--PRNs in place     See detailed discussion of consultants inclusive of oncology  Data Reviewed:  Sodium 137 potassium 4.1 BUN/creatinine  5/0.6 Phosphorus 3.2 magnesium  1.2 Alk phos  603 AST 55 ALT 96 bilirubin was 0.7 WBC 5.4 hemoglobin 10.2 platelet 118    DVT prophylaxis: Heparin   Status is: Inpatient Remains inpatient appropriate because:   Requires further management   Subjective:  Multiple issues with G-tube-appreciative of general surgery addressing and removing it The real question now is management of malnutrition Dietitian engaged Patient is hopeful to go home in the next several days but is still on PCA so we will convert this to orals if she is able to keep them down over the next 1 to 2 days we can try for home    Objective + exam Vitals:   02/04/24 0854 02/04/24 1220 02/04/24 1415 02/04/24 1620  BP:   128/86 117/80  Pulse:   90 96  Resp: 16 17 15 16   Temp:   99.5 F (37.5 C) 99.7 F (37.6 C)  TempSrc:   Oral Oral  SpO2:   100% 99%  Weight:      Height:       Filed Weights   02/02/24 1059 02/04/24 0433  Weight: 80 kg 81 kg   Examination:  Coherent in nad  No icterus no pallor When I saw her J-tube was still in G-tube was also in Little bit distended may be some ascites No JVD S1-S2 no murmur sinus No lower extremity edema   Scheduled Meds:  sodium chloride    Intravenous Once   Chlorhexidine  Gluconate Cloth  6 each Topical Daily   diphenhydrAMINE   25 mg Per Tube Once   feeding supplement (VITAL 1.5 CAL)  1,000 mL Per Tube Q24H   gabapentin   300 mg Oral TID   heparin   5,000 Units Subcutaneous Q8H   lactulose   300 mL Rectal Once   morphine    Intravenous Q4H   pantoprazole   40 mg Oral BID   polyethylene glycol  17 g Oral BID   Ensure Max Protein  11 oz Oral Daily   senna  2 tablet Oral BID   Continuous Infusions:  sodium chloride      calcium  gluconate     TPN ADULT (ION)      Time 35  Colen Grimes, MD  [facility]

## 2024-02-04 NOTE — Progress Notes (Signed)
 PHARMACY - TOTAL PARENTERAL NUTRITION CONSULT NOTE  Indication: obstructing pancreatic cancer  Patient Measurements: Height: 5' 11 (180.3 cm) Weight: 81 kg (178 lb 9.2 oz) IBW/kg (Calculated) : 70.8 TPN AdjBW (KG): 80 Body mass index is 24.91 kg/m. Usual Weight: 205 lb (93.2 kg)  Assessment:  72 YOF with metastatic breast cancer to bone with recent admission from 7/3 > 7/29 where she received TPN before leaving AMA. Also with pancreatic mass with duodenal stricture/SBO with unsuccessful EUS secondary to duodenal stenosis x 2. Underwent GJ bypass on 7/14 then Gtube and Jtube placement on 7/22. She is admitted with malfunction Gtube/Jtube and worsening cancer (pancreatic malignancy with peritoneal carcinomatosis). She is high risk for refeeding syndrome.  Per RD, patient has been able to tolerate soft foods such as ramen noodles soups, and liquids. Some food has been coming out of her Gtube. Patient does not remember if she has been losing weight but has been getting weaker. There is conflicting information between patient and PO intake charted.  8/16 - TPN was not hung last night b/c patient lost PICC access.  TF did not start due to J-tube leakage.  Unable to get access 8/16, OK to hold TPN until further notice; may restart TF and D/C TPN completely per MD.  Glucose / Insulin : no hx DM - CBGs low normal given no nutrition Electrolytes: low CO2, Mag 1.2, others WNL Renal: SCr < 1, BUN WNL (last Lasix  8/14) Hepatic: alk phos up to 603, AST/ALT improving, tbili / TG WNL Intake / Output; MIVF: NS 40K at 75 ml/hr, stool x1 8/14  GI Imaging: 7/4 MR abdomen MRCP: pancreatic mass, vertebral metastases, pancolonic diverticulosis 7/10 CT: pancreatic head equivocal fullness, small amount free pelvic fluid, widespread osseous metastatic disease, small hiatal hernia, colonic diverticulosis 8/14 CT progressive peritoneal carcinomatosis, interval development of mild ascites, progressive extrahepatic  biliary ductal dilatation, progressive moderate right hydronephrosis without obvious calculi, retracted retention balloon of jejunostomy tube, developing encasement of IVC.  GI Surgeries / Procedures:  7/8 EGD 7/7 EUS, upper GI tract 7/14: bypass surgery for creation of G-Junostomy 7/22: Insertion of gastrostomy tube 7/27: CT may represent ileus or pSBO 7/28 celiac plexus block 7/29 EGD 8/14 Gtube exchange  Central access: PICC placed 02/02/24 TPN start date: 12/28/23 >> 01/17/24, 02/02/24 >>  Nutritional Goals: Goal TPN rate is 90 mL/hr (provides 105g AA and 2185 kcals per day)  RD Estimated Needs Total Energy Estimated Needs: 2000-2300 kcal/day Total Protein Estimated Needs: 100-115 gm/day Total Fluid Estimated Needs: >/=2L  Current Nutrition:  TPN  Plan:  Hold TPN until further notice; PICC placement pending Change IVF to D5NS 40K, continue at 75 ml/hr as ordered Mag sulfate 4gm IV x 1 Labs in AM  Ellwyn Ergle D. Lendell, PharmD, BCPS, BCCCP 02/04/2024, 1:01 PM

## 2024-02-04 NOTE — Plan of Care (Signed)
  Problem: Education: Goal: Knowledge of General Education information will improve Description: Including pain rating scale, medication(s)/side effects and non-pharmacologic comfort measures Outcome: Progressing   Problem: Nutrition: Goal: Adequate nutrition will be maintained Outcome: Progressing   Problem: Activity: Goal: Risk for activity intolerance will decrease Outcome: Not Progressing   Problem: Coping: Goal: Level of anxiety will decrease Outcome: Not Progressing   Problem: Pain Managment: Goal: General experience of comfort will improve and/or be controlled Outcome: Not Progressing

## 2024-02-05 DIAGNOSIS — Z515 Encounter for palliative care: Secondary | ICD-10-CM

## 2024-02-05 DIAGNOSIS — Z7189 Other specified counseling: Secondary | ICD-10-CM

## 2024-02-05 DIAGNOSIS — G893 Neoplasm related pain (acute) (chronic): Secondary | ICD-10-CM

## 2024-02-05 DIAGNOSIS — K56609 Unspecified intestinal obstruction, unspecified as to partial versus complete obstruction: Secondary | ICD-10-CM | POA: Diagnosis not present

## 2024-02-05 LAB — BASIC METABOLIC PANEL WITH GFR
Anion gap: 9 (ref 5–15)
BUN: 5 mg/dL — ABNORMAL LOW (ref 6–20)
CO2: 19 mmol/L — ABNORMAL LOW (ref 22–32)
Calcium: 7.7 mg/dL — ABNORMAL LOW (ref 8.9–10.3)
Chloride: 108 mmol/L (ref 98–111)
Creatinine, Ser: 0.54 mg/dL (ref 0.44–1.00)
GFR, Estimated: >60 mL/min (ref >60)
Glucose, Bld: 88 mg/dL (ref 70–99)
Potassium: 4.3 mmol/L (ref 3.5–5.1)
Sodium: 136 mmol/L (ref 135–145)

## 2024-02-05 LAB — PHOSPHORUS: Phosphorus: 2.8 mg/dL (ref 2.5–4.6)

## 2024-02-05 LAB — GLUCOSE, CAPILLARY
Glucose-Capillary: 92 mg/dL (ref 70–99)
Glucose-Capillary: 86 mg/dL (ref 70–99)
Glucose-Capillary: 88 mg/dL (ref 70–99)
Glucose-Capillary: 107 mg/dL — ABNORMAL HIGH (ref 70–99)
Glucose-Capillary: 70 mg/dL (ref 70–99)
Glucose-Capillary: 94 mg/dL (ref 70–99)

## 2024-02-05 LAB — MAGNESIUM: Magnesium: 1.7 mg/dL (ref 1.7–2.4)

## 2024-02-05 MED ORDER — MAGNESIUM SULFATE 2 GM/50ML IV SOLN
2.0000 g | Freq: Once | INTRAVENOUS | Status: AC
  Administered 2024-02-05: 2 g via INTRAVENOUS
  Filled 2024-02-05: qty 50

## 2024-02-05 MED ORDER — FENTANYL 12 MCG/HR TD PT72
1.0000 | MEDICATED_PATCH | TRANSDERMAL | Status: DC
  Administered 2024-02-05: 1 via TRANSDERMAL
  Filled 2024-02-05: qty 1

## 2024-02-05 MED ORDER — OXYCODONE-ACETAMINOPHEN 7.5-325 MG PO TABS
1.0000 | ORAL_TABLET | ORAL | Status: DC | PRN
  Administered 2024-02-05 – 2024-02-06 (×2): 1 via ORAL
  Filled 2024-02-05 (×2): qty 1

## 2024-02-05 NOTE — Progress Notes (Signed)
 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 Lyndel on 8/8 with discomfort irritation around the tube site with gauze and has not started tube feeds   8/12 was seen by nurse practitioner with palliative care --[name] with significant abdominal pain nausea despite oxycodone  7.5 every 4 as needed-gabapentin  was increased to iii/day also found to be hypotensive  Ultimately admitted after labs resulted showing significant abnormalities of electrolytes General surgeon consulted regarding management etc.On admission 8/14 consulted oncology, consulted IR- transfused 2 units PRBC  CT ABD pelvis pancreatic head mass not well-visualized progressive extrahepatic biliary dilatation abrupt cut off pancreatic head-progressive moderate hydronephrosis proximal hydroureter no intrarenal calculi identified?  Intrinsic stricture-progressive peritoneal carcinomatosis-interval mild ascites-percutaneous jejunostomy catheter in place but balloon has been withdrawn within the tract of the anterior abdominal wall--stable osseous metastatic disease with pathological fracture L3 and mid loss of height-diverticulosis without diverticulitis  8/15 attempt was made at upsize of jejunostomy tube to 24 [place] under fluoroscopy  Peritoneal biopsy performed by IR 8/16  fever -likely spuriously, surgeon removed J-tube -trying oral diet  Plan  Spurious fever?  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 Packed care input appreciated continues on oxycodone  7.5 every 4 as needed IV morphine  every 2 as needed and Duragesic  patch 12 continue gabapentin  300 3 times daily Continue Protonix  40 twice daily   Mild hydronephrosis on CT Passing urine-needs attention with follow-up scans  Severe constipation X1 week Passed stool 8/16  Pancreatic mass duodenal stricture JG bypass 7/14--likely pancreatic malignancy See above details Peritoneal biopsy performed 8/15 await results Replacing tunneled catheter placement for Port-A-Cath as will need to be placed and set up for chemo-alerted IR  Underlying prior history of breast cancer with mets to the bone  Anemia + thrombocytopenia likely of malignancy Hemoglobin improved--watch platelets---  Multiple lab electrolyte abnormalities Hyponatremia-corrected with saline-I will saline lock today and see how she does overall in the next 24 hours with regards to azotemia Hypokalemia-corrected Hypocalcemia-overall corrected Hypomagnesemia 2 g magnesium  given today  Hypotension previously Holding propranolol  10 mg twice daily for now--PRNs in place     See detailed discussion of consultants inclusive of oncology  Data Reviewed:   Sodium 136 potassium 4.3 magnesium  1.7 calcium  7.7 BUN/creatinine 5/0.5 CO2 19   DVT prophylaxis: Heparin   Status is: Inpatient Remains inpatient appropriate because:   Requires further management   Subjective:  Looks better feels fair ate 2 meals today feels hungry passed stool earlier  no chest pain multiple questions about J-tube site which is leaking as  well as concerns for possible chemo needs and Port-A-Cath placement I have changed the order for Port-A-Cath    Objective + exam Vitals:   02/05/24 0500 02/05/24 0734 02/05/24 0752 02/05/24 1137  BP: 107/81 119/83    Pulse: 71 91    Resp: 16 17 16 17   Temp: 98.6 F (37 C) 98.4 F (36.9 C)    TempSrc: Oral Oral    SpO2: 96% 100%    Weight: 83.3 kg     Height:       Filed Weights   02/02/24 1059 02/04/24 0433 02/05/24 0500  Weight: 80 kg 81 kg 83.3 kg   Examination:  Coherent in nad  No icterus no pallor Abdomen is soft she has a bag around the J-tube site which is leaking however the G-tube looks comfortable abdomen is not distended or tender she looks a lot better overall No lower extremity edema Slightly cushingoid No icterus Chest is clear No lower extremity edema   Scheduled Meds:  sodium chloride    Intravenous Once   Chlorhexidine  Gluconate Cloth  6 each Topical Daily   feeding supplement (VITAL 1.5 CAL)  1,000 mL Per Tube Q24H   fentaNYL   1 patch Transdermal Q72H   gabapentin   300 mg Oral TID   heparin   5,000 Units Subcutaneous Q8H   liver oil-zinc  oxide   Topical BID   pantoprazole   40 mg Oral BID   polyethylene glycol  17 g Oral BID   Ensure Max Protein  11 oz Oral Daily   senna  2 tablet Oral BID   Continuous Infusions:  sodium chloride  40 mL/hr at 02/04/24 1832    Time 35  Colen Grimes, MD  [facility]

## 2024-02-05 NOTE — Progress Notes (Signed)
 Changed split gauze at J tube early in shift (2000 08/16), avoided using tape at this time. Gauze was CDI at 0530. Ordered the Aquacel AG and the Drawtex (see WOC note), and it is at bedside for next dressing change. I educated the pt on the dressing and plan.

## 2024-02-05 NOTE — Progress Notes (Signed)
 Morphine  1mg /ml PCA injection discontinued. 20 ml of Morphine  wasted witnessed by Lucie Shuck, RN.   Metta Moats, RN

## 2024-02-05 NOTE — Consult Note (Signed)
 Palliative Medicine Inpatient Consult Note  Consulting Provider: Samtani, Jai-Gurmukh, MD   Reason for consult:   Palliative Care Consult Services Palliative Medicine Consult  Reason for Consult? 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 Reviecing TPN presently. Palliative care involved for goals of care conversations. Ms. Toren is established with OP Palliative care through the [facility] and was last seen 8/12.   Clinical Assessment/Goals of Care:  *Please note that this is a verbal dictation therefore any spelling or grammatical errors are due to the Dragon Medical One system interpretation.  I have reviewed medical records including EPIC notes, labs and imaging, received report from bedside RN, assessed the patient who is lying in bed in NAD. Discussion with nursing related to PCA dosing has required 22mg  in the last 24 hours.    I met with Maisha to further discuss diagnosis prognosis, GOC, EOL wishes, disposition and options.   I introduced Palliative Medicine as specialized medical care for people living with serious illness. It focuses on providing relief from the symptoms and stress of a serious illness. The goal is to improve quality of life for both the patient and the family.  Medical History Review and Understanding:  A review of Tashawna's past medical history significant for breast cancer, depression, DVT, hypertension, hyperlipidemia, anxiety, and pancreatic mass was completed.  Social History:  Tarin is from [place]  originally. She presently lives in [place]. She has 5 children and 11 grandchildren. She was a night shift Production designer, theatre/television/film for [employer]. She enjoys shopping (all types). Maddux's family is her support system. Theona is a woman of faith  practicing within [employer] though non-denominational.  Functional and Nutritional State:  Arwen shares that she is able to mobilize in her home. She formerly lived with a best friend though is now living with her daughter. She has started to eat since admission.  Palliative Symptoms:  Pain - chronic in the setting of abdominal malignancy with bone metastases. Has been receiving a morphine  PCA since 8/15 during  hospitalization. Receiving percocet 7.5-325mg  PO Q6H PRN. Morphine  2mg  IVP Q2H PRN. Gabapentin  300mg  PO TID. Per review of Cancer center Palliative notes the team had been considering methadone versus fentanyl .   Nausea - has improved since J tube removal. Has g-tube to vent as needed. Receiving zofran  and compazine  as needed has only required x1 dose.   Shela is not presently constipated and did have a BM yesterday.  Advance Directives:  A detailed discussion was had today regarding advanced directives.  Advanced directives had previosuly completed and are on file.   Code Status:  Concepts specific to code status, artifical feeding and hydration, continued IV antibiotics and rehospitalization was had.  The difference between a aggressive medical intervention path  and a palliative comfort care path for this patient at this time was had.   Encouraged patient/family to consider DNR/DNI status understanding evidenced based poor outcomes in similar hospitalized patient, as the cause of arrest is likely associated with advanced chronic/terminal illness rather than an easily reversible acute cardio-pulmonary event. I explained that DNR/DNI does not change the medical plan and it only comes into effect after a person has arrested (died).  It is a protective measure to keep us  from harming the patient in their last moments of life.  Charmeka would like full resuscitative efforts to be made is she should decline.   Discussion:  Ailee shares with me that she has had breast cancer for a  number of years now.  She and I reviewed her present understanding of her disease.  Oncology has shared if Mariha has metastatic pancreatic cancer that her prognosis would be quite poor with a limited life expectancy.  She expresses very clearly her goals for full aggressive care in the setting of her disease burden.  Christionna's hopes are to feel well again and to continue on with a good quality of life.  If treatments are offered she would like to pursue those.  At this time Racheal and I discussed the involvement of palliative care not only to help support her with her disease but also to further manage her symptoms. She and I reviewed that we will support additional medication adjustments.   Discussed the importance of continued conversation with family and their  medical providers regarding overall plan of care and treatment options, ensuring decisions are within the context of the patients values and GOCs.  Decision Maker: Curtistine Albee (Son): Emergency Contact: [phone]  SUMMARY OF RECOMMENDATIONS   Full code / Full scope of care  Discussions of patients acute on chronic disease burden(s)  Patients goals are to improve and she is willing to accept any and all treatments  GI pathology is pending   Ongoing PMT support  Code Status/Advance Care Planning: FULL CODE   Symptom Management:  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: - Gabapentin  300mg  PO TID - Morphine  2mg  IVP Q2H PRN - Percocet 7.5-325mg  1 tabs PO Q4H PRN - Within the past 24 hours, patient has required 22mg  of morphine  IV via PCA  for opioid management. Based on OMEs calculated for this dose, which would be approximately 66mg  when reducing by 50% for incomplete cross tolerance, will appropriately start patient on fentanyl  12.5mcg/hr with breakthrough medication as above. Will stop morphine  PCA at this time  Constipation  - Senna 2 Tabs PO BID - Miralax  17g PO BID  Nausea  -  Compazine  10mg  PO Q6H PRN - Zofran  4mg  IVP Q6H PRN - G-Tube with venting as needed  Palliative Prophylaxis:  Aspiration, Bowel Regimen, Delirium Protocol, Frequent Pain Assessment, Oral Care, Palliative Wound Care, and Turn Reposition  Additional Recommendations (Limitations, Scope, Preferences): Avoid Hospitalization, Full Scope Treatment, No Artificial Feeding, No Surgical Procedures, and No Tracheostomy  Psycho-social/Spiritual:  Desire for further Chaplaincy support: Not at this time Additional Recommendations: Discussion of disease burden   Prognosis: Pending pathology though given the disease severity with metastasis the general prognosis appears limited.   Discharge Planning: To be determined.   Vitals:   02/05/24 0458 02/05/24 0500  BP:  107/81  Pulse:  71  Resp: 16 16  Temp:  98.6 F (37 C)  SpO2:  96%    Intake/Output Summary (Last 24 hours) at 02/05/2024 0706 Last data filed at 02/05/2024 0542 Gross per 24 hour  Intake 400 ml  Output 900 ml  Net -500 ml   Last Weight  Most recent update: 02/05/2024  5:39 AM    Weight  83.3 kg (183 lb 10.3 oz)            Gen:  Middle aged AA F chronically ill appearing HEENT: moist mucous membranes CV: Regular rate and rhythm  PULM: On RA breathing is even and nonlabored ABD: soft, (+) g-tube EXT: No edema  Neuro: Alert and oriented  x3   PPS: 40%   This conversation/these recommendations were discussed with patient primary care team, Dr. Royal ______________________________________________________ Rosaline Becton [facility] Palliative Medicine Team Team Cell Phone: [phone] Please utilize secure chat with additional questions, if there is no response within 30 minutes please call the above phone number  Total Time: 120 Billing based on MDM: High  Palliative Medicine Team providers are available by phone from 7am to 7pm daily and can be reached through the team cell phone.  Should this patient require  assistance outside of these hours, please call the patient's attending physician.

## 2024-02-05 NOTE — Progress Notes (Signed)
 Assessment & Plan: 53 yo Chelsea Hansen - duodenal obstruction from a pancreatic head mass, carcinomatosis, met breast ca - status post gastrojejunostomy, G tube, J tube - G tube vent as needed for nausea/vomiting - J tube now removed due to obstruction, pouch over site with small output - PICC for TPN - improved po intake - calorie counts ongoing - medical oncology consult to establish goals of care   Will follow        Krystal Spinner, MD [facility] practice Office: [phone]        Chief Complaint: Duodenal obstruction, carcinomatosis  Subjective: Patient in bed, comfortable.  Tolerating some po in small meals.  Objective: Vital signs in last 24 hours: Temp:  [98.4 F (36.9 C)-100 F (37.8 C)] 98.4 F (36.9 C) (08/17 0734) Pulse Rate:  [71-96] 91 (08/17 0734) Resp:  [15-17] 16 (08/17 0752) BP: (107-128)/(80-86) 119/83 (08/17 0734) SpO2:  [96 %-100 %] 100 % (08/17 0734) Weight:  [83.3 kg] 83.3 kg (08/17 0500) Last BM Date : 02/03/24  Intake/Output from previous day: 08/16 0701 - 08/17 0700 In: 400 [P.O.:400] Out: 900 [Urine:650; Drains:250] Intake/Output this shift: No intake/output data recorded.  Physical Exam: HEENT - sclerae clear, mucous membranes moist Abdomen - soft, G-tube capped; J-tube removed and site pouched with small output  Lab Results:  Recent Labs    02/03/24 0337 02/04/24 1129  WBC 4.4 5.4  HGB 8.7* 10.2*  HCT 26.4* 31.7*  PLT 122* 118*   BMET Recent Labs    02/03/24 0337 02/04/24 1129  NA 137 137  K 3.6 4.1  CL 102 105  CO2 25 21*  GLUCOSE 93 93  BUN <5* <5*  CREATININE 0.49 0.60  CALCIUM  7.1* 7.3*   PT/INR Recent Labs    02/03/24 0337  LABPROT 15.8*  INR 1.2   Comprehensive Metabolic Panel:    Component Value Date/Time   NA 137 02/04/2024 1129   NA 137 02/03/2024 0337   NA 144 11/30/2023 1000   NA 139 10/06/2020 1432   K 4.1 02/04/2024 1129   K 3.6 02/03/2024 0337   CL 105 02/04/2024  1129   CL 102 02/03/2024 0337   CO2 21 (L) 02/04/2024 1129   CO2 25 02/03/2024 0337   BUN <5 (L) 02/04/2024 1129   BUN <5 (L) 02/03/2024 0337   BUN 19 11/30/2023 1000   BUN 11 10/06/2020 1432   CREATININE 0.60 02/04/2024 1129   CREATININE 0.49 02/03/2024 0337   CREATININE 0.95 01/31/2024 1147   CREATININE 0.68 01/25/2024 0949   GLUCOSE 93 02/04/2024 1129   GLUCOSE 93 02/03/2024 0337   CALCIUM  7.3 (L) 02/04/2024 1129   CALCIUM  7.1 (L) 02/03/2024 0337   AST 55 (H) 02/04/2024 1129   AST 74 (H) 02/02/2024 0615   AST 43 (H) 01/31/2024 1147   AST 50 (H) 01/25/2024 0949   ALT 96 (H) 02/04/2024 1129   ALT 74 (H) 02/02/2024 0615   ALT 62 (H) 01/31/2024 1147   ALT 76 (H) 01/25/2024 0949   ALKPHOS 603 (H) 02/04/2024 1129   ALKPHOS 518 (H) 02/02/2024 0615   BILITOT 0.7 02/04/2024 1129   BILITOT 0.8 02/02/2024 0615   BILITOT 0.7 01/31/2024 1147   BILITOT 0.6 01/25/2024 0949   PROT 6.2 (L) 02/04/2024 1129   PROT 5.4 (L) 02/02/2024 0615   PROT 7.3 11/30/2023 1000   PROT 6.9 10/06/2020 1432   ALBUMIN  2.6 (L) 02/04/2024 1129   ALBUMIN  2.3 (L) 02/02/2024 9384  ALBUMIN  4.5 11/30/2023 1000   ALBUMIN  4.1 10/06/2020 1432    Studies/Results: IR Replc Duoden/Jejuno Tube Percut W/Fluoro Result Date: 02/03/2024 INDICATION: Leaking catheter. History of pancreatic mass with duodenal ureteral stricture/obstruction. Surgical gastrostomy and jejunostomy tubes placed 01/10/2024. Reported J-tube leakage. EXAM: JEJUNOSTOMY TUBE EXCHANGE and UPSIZE COMPARISON:  KUB, 02/02/2024. CT AP, 02/02/2024. IR fluoroscopy, 01/20/2024. MEDICATIONS: None CONTRAST:  20mL OMNIPAQUE  IOHEXOL  300 MG/ML  SOLN FLUOROSCOPY: Radiation Exposure Index and estimated peak skin dose (PSD); Reference air kerma (RAK), 6.6 mGy. Kerma-area product (KAP), 122.96 uGy*m. COMPLICATIONS: None immediate. PROCEDURE: Informed written consent was obtained from the patient and/or patient's representative after a discussion of the risks, benefits  and alternatives to treatment. Questions regarding the procedure were encouraged and answered. A timeout was performed prior to the initiation of the procedure. The site of prior jejunostomy tube was prepped and draped in the usual sterile fashion, and a sterile drape was applied covering the operative field. Maximum barrier sterile technique with sterile gowns and gloves were used for the procedure. A timeout was performed prior to the initiation of the procedure. Contrast injection confirmed appropriate intraluminal positioning. The jejunostomy tube was cannulated with a stiff Glidewire and under intermittent fluoroscopic guidance, the catheter was exchanged for a new 24 Fr jejunostomy tube. The retention balloon was inflated and the balloon was cinched. Contrast was injected and several spot fluoroscopic images were obtained in various obliquities confirming intraluminal positioning. The patient tolerated the procedure well without immediate postprocedural complication. IMPRESSION: Successful fluoroscopic guided exchange and upsize to a 24 Fr jejunostomy tube. The new jejunostomy tube is ready for immediate use. RECOMMENDATIONS: The patient will return to Vascular Interventional Radiology (VIR) for routine feeding tube evaluation and exchange in 6 months. Thom Hall, MD [facility] Electronically Signed   By: Thom Hall M.D.   On: 02/03/2024 15:48   US  EKG Site Rite Result Date: 02/03/2024 If Site Rite image not attached, placement could not be confirmed due to current cardiac rhythm.  CT ABDOMINAL MASS BIOPSY Result Date: 02/03/2024 INDICATION: 742006 Metastatic disease (HCC) 742006 history of breast cancer with peritoneal mass EXAM: CT-GUIDED PERITONEAL MASS BIOPSY COMPARISON:  CT AP, 02/02/2024. MEDICATIONS: 50 mg Benadryl  IV ANESTHESIA/SEDATION: Moderate (conscious) sedation was employed during this procedure. A total of Versed  2 mg and  Fentanyl  100 mcg was administered intravenously. Moderate Sedation Time: 21 minutes. The patient's level of consciousness and vital signs were monitored continuously by radiology nursing throughout the procedure under my direct supervision. CONTRAST:  None. COMPLICATIONS: None immediate. PROCEDURE: RADIATION DOSE REDUCTION: This exam was performed according to the departmental dose-optimization program which includes automated exposure control, adjustment of the mA and/or kV according to patient size and/or use of iterative reconstruction technique. Informed consent was obtained from the patient following an explanation of the procedure, risks, benefits and alternatives. A time out was performed prior to the initiation of the procedure. The patient was positioned supine on the CT table and a limited CT was performed for procedural planning demonstrating peritoneal mass at the LEFT lower quadrant. The procedure was planned. The operative site was prepped and draped in the usual sterile fashion. Appropriate trajectory was confirmed with a 22 gauge spinal needle after the adjacent tissues were anesthetized with 1% Lidocaine  with epinephrine . Under intermittent CT guidance, a 17 gauge coaxial needle was advanced into the peripheral aspect of the mass. Appropriate positioning was confirmed and 3 samples were obtained with an 18 gauge core needle biopsy device. The  co-axial needle was removed and hemostasis was achieved with manual compression. A limited postprocedural CT was negative for hemorrhage or additional complication. A dressing was placed. The patient tolerated the procedure well without immediate postprocedural complication. IMPRESSION: Successful CT guided core needle biopsy of peritoneal mass. Thom Hall, MD [facility] Electronically Signed   By: Thom Hall M.D.   On: 02/03/2024 14:56   DG Chest Port 1 View Result Date: 02/03/2024 CLINICAL  DATA:  Status post PICC catheter insertion EXAM: PORTABLE CHEST 1 VIEW COMPARISON:  Chest radiograph February 02, 2024 FINDINGS: Cardiomediastinal silhouette is mildly accentuated due to low lung volumes. No new pulmonary opacity. Mild blunting of left costophrenic angle likely positional. Interval placement of left PICC catheter tip terminating in distal SVC. Right-sided PICC catheter is removed. No acute osseous abnormality. Surgical clips overlying the left lower chest. IMPRESSION: Left-sided PICC catheter tip terminates in distal SVC. Interval removal of right PICC catheter. Electronically Signed   By: Megan  Zare M.D.   On: 02/03/2024 14:53      Krystal Spinner 02/05/2024  Patient ID: Chelsea Hansen, Chelsea Hansen   DOB: 1971-04-26, 53 y.o.   MRN: [id number]

## 2024-02-05 NOTE — Plan of Care (Signed)
  Problem: Nutrition: Goal: Adequate nutrition will be maintained Outcome: Progressing   Problem: Pain Managment: Goal: General experience of comfort will improve and/or be controlled Outcome: Progressing

## 2024-02-05 NOTE — Progress Notes (Signed)
 PHARMACY - TOTAL PARENTERAL NUTRITION CONSULT NOTE  Indication: obstructing pancreatic cancer  Patient Measurements: Height: 5' 11 (180.3 cm) Weight: 83.3 kg (183 lb 10.3 oz) IBW/kg (Calculated) : 70.8 TPN AdjBW (KG): 80 Body mass index is 25.61 kg/m. Usual Weight: 205 lb (93.2 kg)  Assessment:  32 YOF with metastatic breast cancer to bone with recent admission from 7/3 > 7/29 where she received TPN before leaving AMA. Also with pancreatic mass with duodenal stricture/SBO with unsuccessful EUS secondary to duodenal stenosis x 2. Underwent GJ bypass on 7/14 then Gtube and Jtube placement on 7/22. She is admitted with malfunction Gtube/Jtube and worsening cancer (pancreatic malignancy with peritoneal carcinomatosis). She is high risk for refeeding syndrome.  Per RD, patient has been able to tolerate soft foods such as ramen noodles soups, and liquids. Some food has been coming out of her Gtube. Patient does not remember if she has been losing weight but has been getting weaker. There is conflicting information between patient and PO intake charted.  8/16 - TPN was not hung last night b/c patient lost PICC access.  TF did not start due to J-tube leakage.  Unable to get access 8/16, OK to hold TPN until further notice; may restart TF and/or oral diet and D/C TPN completely per MD.  Glucose / Insulin : no hx DM - CBGs low normal given no nutrition Electrolytes: low CO2, Mag 1.7 post 4gm IV, others WNL Renal: SCr < 1, BUN WNL (last Lasix  8/14) Hepatic: alk phos up to 603, AST/ALT improving, tbili / TG WNL Intake / Output; MIVF: UOP 0.3 ml/kg/hr, drain , NS 40 ml/hr, LBM 8/16  GI Imaging: 7/4 MR abdomen MRCP: pancreatic mass, vertebral metastases, pancolonic diverticulosis 7/10 CT: pancreatic head equivocal fullness, small amount free pelvic fluid, widespread osseous metastatic disease, small hiatal hernia, colonic diverticulosis 8/14 CT progressive peritoneal carcinomatosis, interval  development of mild ascites, progressive extrahepatic biliary ductal dilatation, progressive moderate right hydronephrosis without obvious calculi, retracted retention balloon of jejunostomy tube, developing encasement of IVC.  GI Surgeries / Procedures:  7/8 EGD 7/7 EUS, upper GI tract 7/14: bypass surgery for creation of G-Junostomy 7/22: Insertion of gastrostomy tube 7/27: CT may represent ileus or pSBO 7/28 celiac plexus block 7/29 EGD 8/14 Gtube exchange 8/16 remove Jtube d/t obstruction  Central access: PICC placed 02/02/24 TPN start date: 12/28/23 >> 01/17/24, 02/02/24 >>  Nutritional Goals: Goal TPN rate is 90 mL/hr (provides 105g AA and 2185 kcals per day)  RD Estimated Needs Total Energy Estimated Needs: 2000-2300 kcal/day Total Protein Estimated Needs: 100-115 gm/day Total Fluid Estimated Needs: >/=2L  Current Nutrition:  TPN Soft diet  Plan:  Hold TPN until further notice per MD.  Calorie count in process. NS at 40 ml/hr per MD Mag sulfate 2gm IV x 1 D/C CBG checks Pharmacy will sign off.  MD will reconsult if TPN is to be restarted.  Deena Shaub D. Lendell, PharmD, BCPS, BCCCP 02/05/2024, 10:12 AM

## 2024-02-06 ENCOUNTER — Ambulatory Visit (HOSPITAL_COMMUNITY): Payer: MEDICAID

## 2024-02-06 ENCOUNTER — Encounter: Payer: Self-pay | Admitting: Hematology and Oncology

## 2024-02-06 ENCOUNTER — Other Ambulatory Visit (HOSPITAL_COMMUNITY): Payer: Self-pay

## 2024-02-06 ENCOUNTER — Inpatient Hospital Stay (HOSPITAL_COMMUNITY): Payer: MEDICAID

## 2024-02-06 ENCOUNTER — Encounter (HOSPITAL_COMMUNITY): Payer: Self-pay

## 2024-02-06 ENCOUNTER — Encounter: Payer: Self-pay | Admitting: General Practice

## 2024-02-06 DIAGNOSIS — K56609 Unspecified intestinal obstruction, unspecified as to partial versus complete obstruction: Secondary | ICD-10-CM | POA: Diagnosis not present

## 2024-02-06 DIAGNOSIS — K5903 Drug induced constipation: Secondary | ICD-10-CM

## 2024-02-06 DIAGNOSIS — G893 Neoplasm related pain (acute) (chronic): Secondary | ICD-10-CM | POA: Diagnosis not present

## 2024-02-06 DIAGNOSIS — Z515 Encounter for palliative care: Secondary | ICD-10-CM | POA: Diagnosis not present

## 2024-02-06 DIAGNOSIS — Z7189 Other specified counseling: Secondary | ICD-10-CM | POA: Diagnosis not present

## 2024-02-06 HISTORY — PX: IR IMAGING GUIDED PORT INSERTION: IMG5740

## 2024-02-06 LAB — BASIC METABOLIC PANEL WITH GFR
Anion gap: 6 (ref 5–15)
BUN: <5 mg/dL — ABNORMAL LOW (ref 6–20)
CO2: 23 mmol/L (ref 22–32)
Calcium: 8 mg/dL — ABNORMAL LOW (ref 8.9–10.3)
Chloride: 105 mmol/L (ref 98–111)
Creatinine, Ser: 0.63 mg/dL (ref 0.44–1.00)
GFR, Estimated: >60 mL/min (ref >60)
Glucose, Bld: 87 mg/dL (ref 70–99)
Potassium: 4.2 mmol/L (ref 3.5–5.1)
Sodium: 134 mmol/L — ABNORMAL LOW (ref 135–145)

## 2024-02-06 LAB — MAGNESIUM: Magnesium: 1.8 mg/dL (ref 1.7–2.4)

## 2024-02-06 LAB — GLUCOSE, CAPILLARY
Glucose-Capillary: 78 mg/dL (ref 70–99)
Glucose-Capillary: 91 mg/dL (ref 70–99)
Glucose-Capillary: 94 mg/dL (ref 70–99)

## 2024-02-06 LAB — PHOSPHORUS: Phosphorus: 2.8 mg/dL (ref 2.5–4.6)

## 2024-02-06 MED ORDER — CEFAZOLIN SODIUM-DEXTROSE 2-4 GM/100ML-% IV SOLN
INTRAVENOUS | Status: AC | PRN
  Administered 2024-02-06: 2 g via INTRAVENOUS

## 2024-02-06 MED ORDER — FENTANYL CITRATE (PF) 100 MCG/2ML IJ SOLN
INTRAMUSCULAR | Status: AC
  Filled 2024-02-06: qty 2

## 2024-02-06 MED ORDER — CEFAZOLIN SODIUM-DEXTROSE 2-4 GM/100ML-% IV SOLN
INTRAVENOUS | Status: AC
  Filled 2024-02-06: qty 100

## 2024-02-06 MED ORDER — FENTANYL CITRATE (PF) 100 MCG/2ML IJ SOLN
INTRAMUSCULAR | Status: AC | PRN
  Administered 2024-02-06 (×2): 50 ug via INTRAVENOUS

## 2024-02-06 MED ORDER — PROCHLORPERAZINE EDISYLATE 10 MG/2ML IJ SOLN
10.0000 mg | Freq: Four times a day (QID) | INTRAMUSCULAR | Status: DC | PRN
  Administered 2024-02-06: 10 mg via INTRAVENOUS
  Filled 2024-02-06: qty 2

## 2024-02-06 MED ORDER — THIAMINE MONONITRATE 100 MG PO TABS
100.0000 mg | ORAL_TABLET | Freq: Every day | ORAL | Status: DC
  Administered 2024-02-06: 100 mg via ORAL
  Filled 2024-02-06: qty 1

## 2024-02-06 MED ORDER — OXYCODONE-ACETAMINOPHEN 7.5-325 MG PO TABS: 1.0000 | ORAL_TABLET | ORAL | Status: DC | PRN

## 2024-02-06 MED ORDER — OXYCODONE-ACETAMINOPHEN 7.5-325 MG PO TABS: 2.0000 | ORAL_TABLET | ORAL | Status: DC | PRN

## 2024-02-06 MED ORDER — HEPARIN SOD (PORK) LOCK FLUSH 100 UNIT/ML IV SOLN
INTRAVENOUS | Status: AC
  Filled 2024-02-06: qty 5

## 2024-02-06 MED ORDER — MIDAZOLAM HCL 2 MG/2ML IJ SOLN
INTRAMUSCULAR | Status: AC | PRN
  Administered 2024-02-06 (×3): 1 mg via INTRAVENOUS

## 2024-02-06 MED ORDER — BOOST / RESOURCE BREEZE PO LIQD CUSTOM: 1.0000 | Freq: Three times a day (TID) | ORAL | Status: DC

## 2024-02-06 MED ORDER — OXYCODONE-ACETAMINOPHEN 7.5-325 MG PO TABS
2.0000 | ORAL_TABLET | ORAL | 0 refills | Status: DC | PRN
  Filled 2024-02-06: qty 30, 3d supply, fill #0

## 2024-02-06 MED ORDER — MIDAZOLAM HCL 2 MG/2ML IJ SOLN
INTRAMUSCULAR | Status: AC
  Filled 2024-02-06: qty 2

## 2024-02-06 MED ORDER — BOOST / RESOURCE BREEZE PO LIQD CUSTOM
1.0000 | Freq: Two times a day (BID) | ORAL | Status: DC
  Administered 2024-02-06: 237 mL via ORAL

## 2024-02-06 MED ORDER — LIDOCAINE HCL 1 % IJ SOLN
20.0000 mL | Freq: Once | INTRAMUSCULAR | Status: AC
  Administered 2024-02-06: 15 mL via INTRADERMAL

## 2024-02-06 MED ORDER — FENTANYL 12 MCG/HR TD PT72
1.0000 | MEDICATED_PATCH | TRANSDERMAL | 0 refills | Status: DC
  Filled 2024-02-06 – 2024-02-14 (×3): qty 5, 15d supply, fill #0

## 2024-02-06 MED ORDER — LIDOCAINE HCL 1 % IJ SOLN
INTRAMUSCULAR | Status: AC
  Filled 2024-02-06: qty 20

## 2024-02-06 MED ORDER — HEPARIN SOD (PORK) LOCK FLUSH 100 UNIT/ML IV SOLN
500.0000 [IU] | Freq: Once | INTRAVENOUS | Status: AC
  Administered 2024-02-06: 500 [IU] via INTRAVENOUS
  Filled 2024-02-06: qty 5

## 2024-02-06 MED ORDER — BOOST / RESOURCE BREEZE PO LIQD CUSTOM: 1.0000 | Freq: Two times a day (BID) | ORAL | Status: DC

## 2024-02-06 MED ORDER — JUVEN PO PACK
1.0000 | PACK | Freq: Two times a day (BID) | ORAL | Status: DC
  Administered 2024-02-06: 1 via ORAL
  Filled 2024-02-06: qty 1

## 2024-02-06 MED ORDER — ADULT MULTIVITAMIN W/MINERALS CH: 1.0000 | ORAL_TABLET | Freq: Every day | ORAL | Status: DC

## 2024-02-06 MED ORDER — ADULT MULTIVITAMIN W/MINERALS CH
1.0000 | ORAL_TABLET | Freq: Every day | ORAL | Status: DC
  Administered 2024-02-06: 1 via ORAL
  Filled 2024-02-06: qty 1

## 2024-02-06 MED ORDER — THIAMINE HCL 100 MG PO TABS
100.0000 mg | ORAL_TABLET | Freq: Every day | ORAL | 0 refills | Status: DC
  Filled 2024-02-06: qty 30, 30d supply, fill #0

## 2024-02-06 NOTE — Discharge Summary (Signed)
 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 Severe electrolyte abnormalities Malnutrition  Please see below for itemized issues addressed in HOpsital- refer to other progress notes for clarity if needed  Discharge Condition: Guarded but improved a little  Diet recommendation: Eat what ever is able  [employer]   02/02/24 1059 02/04/24 0433 02/05/24 0500  Weight: 80 kg 81 kg 83.3 kg    History of present illness:  53 year old black female Breast cancer metastasized to bone on anastrozole  Verzenio  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 Lyndel on 8/8 with discomfort irritation around the tube site with gauze and has not started tube feeds   8/12 was seen by nurse practitioner with palliative care --Chelsea Hansen with significant abdominal pain nausea despite oxycodone  7.5 every 4 as needed-gabapentin  was increased to iii/day also found to be hypotensive   Ultimately admitted after labs resulted showing significant abnormalities of electrolytes General surgeon consulted regarding management etc.On admission 8/14 consulted oncology, consulted IR- transfused 2 units PRBC         CT ABD pelvis pancreatic head mass not well-visualized progressive  extrahepatic biliary dilatation abrupt cut off pancreatic head-progressive moderate hydronephrosis proximal hydroureter no intrarenal calculi identified?  Intrinsic stricture-progressive peritoneal carcinomatosis-interval mild ascites-percutaneous jejunostomy catheter in place but balloon has been withdrawn within the tract of the anterior abdominal wall--stable osseous metastatic disease with pathological fracture L3 and mid loss of height-diverticulosis without diverticulitis   8/15 attempt was made at upsize of jejunostomy tube to 24 [place] under fluoroscopy         Peritoneal biopsy performed by IR 8/16 fever -likely spuriously, surgeon removed J-tube -trying oral diet   Plan   Spurious fever?  8/16-the room was normal according to the patient and she felt uncomfortable circumscribed workup performed [facility] X2 NGTD 8/16-no further workup   Malfunction of G-tube/J-tube  Underlying pancreatic malignancy now with peritoneal carcinomatosis J-tube  came out ultimately removed by general surgery Feeds G-tube venting --Eakin's pouch training performed over area of gastrostomy site and patient is conversant-relative is an Charity fundraiser and will be looking in on the patient for teaching of this and gastrostomy venting in case his nausea   Abdominal pain secondary to 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 Palliative care helped adjust medication as below I gave refills called him into the [facility] pharmacy continue gabapentin  300 3 times daily Continue Protonix  40 twice daily    Mild hydronephrosis on CT Passing urine-needs attention with follow-up scans   Severe constipation X1 week Passed stool 8/16   Pancreatic mass duodenal stricture JG bypass 7/14--likely pancreatic malignancy See above details Peritoneal biopsy performed 8/15 await results Port-A-Cath placed with assistance from IR and should be okay to use going forward in the outpatient setting should she continue to wish chemo  Underlying prior  history of breast  cancer with mets to the bone   Anemia + thrombocytopenia likely of malignancy Hemoglobin improved--watch platelets---   Multiple lab electrolyte abnormalities Hyponatremia-corrected with saline Hypokalemia-corrected Hypocalcemia-overall corrected and replaced-resume calcium  carbonate at home high-dose because of tendency towards low blood levels in the outpatient Hypomagnesemia resolved   Hypotension previously Holding propranolol  10 mg twice dail and    Discharge Exam: Vitals:   02/06/24 1440 02/06/24 1604  BP: 102/77 101/72  Pulse: 85 96  Resp: 16 17  Temp:  98.4 F (36.9 C)  SpO2: 100% 100%    Subj on day of d/c   Good spirits now that she has had something to eat was quite irritable this morning  General Exam on discharge  EOMI NCAT flat affect No icterus no pallor Chest clear Port-A-Cath in place dressing area seems clean S1-S2 no murmur abdomen soft Eakin's pouch in place area seen dry she is slightly distended Trace lower extremity edema Neuro intact  Discharge Instructions   Discharge Instructions     Diet - low sodium heart healthy   Complete by: As directed    Discharge wound care:   Complete by: As directed    02/04/24 2000    Wound care  Daily      Comments: Cleanse around G tube with NS, apply silver  hydrofiber Soila [phone] Aquacel AG) to red moist areas daily (may cut Aquacel like a split gauze and use around G tube).  If skin is irritated from drainage ok to  apply Desitin 2 times daily as needed.     If G tube is draining and not contained by silver  would try Drawtex superabsorbent dressing cut like a split gauze Gerlean #[id number]  02/04/24 1839    02/03/24 1914    Monitor biopsy site every 15 minutes x 4, every 30 minutes x 2, then per unit routine  Once       02/03/24 1913   Increase activity slowly   Complete by: As directed       Allergies as of 02/06/2024   No Known Allergies      Medication List     STOP  taking these medications    anastrozole  1 MG tablet Commonly known as: ARIMIDEX    propranolol  10 MG tablet Commonly known as: INDERAL    scopolamine  1 MG/3DAYS Commonly known as: TRANSDERM-SCOP   Verzenio  50 MG tablet Generic drug: abemaciclib        TAKE these medications    calcitRIOL  0.5 MCG capsule Commonly known as: ROCALTROL  Take 1 capsule (0.5 mcg total) by mouth daily.   Calcium  Carb-Cholecalciferol  600-10 MG-MCG Tabs Take 1 tablet by mouth 3 (three) times daily.   fentaNYL  12 MCG/HR Commonly known as: DURAGESIC  Place 1 patch onto the skin every 3 (three) days. Start taking on: February 08, 2024   gabapentin  300 MG capsule Commonly known as: NEURONTIN  Take 1 capsule (300 mg total) by mouth 3 (three) times daily.   multivitamin with minerals Tabs tablet Take 1 tablet by mouth daily. Start taking on: February 07, 2024   oxyCODONE -acetaminophen  7.5-325 MG tablet Commonly known as: PERCOCET Take 2 tablets by mouth every 4 (four) hours as needed for severe pain (pain score 7-10). What changed:  how much to take when to take this reasons to take this   pantoprazole  40 MG tablet Commonly known as: Protonix  Take 1 tablet (40 mg total) by mouth 2 (two) times daily.   polyethylene glycol powder 17 GM/SCOOP powder Commonly known as: GLYCOLAX /MIRALAX  Mix 17 g in  4 oz of liquid and take by mouth 2 (two) times daily as needed.   prochlorperazine  10 MG tablet Commonly known as: COMPAZINE  Take 1 tablet (10 mg total) by mouth every 6 (six) hours as needed for nausea or vomiting.   senna 8.6 MG Tabs tablet Commonly known as: SENOKOT Take 2 tablets (17.2 mg total) by mouth 2 (two) times daily.   thiamine  100 MG tablet Commonly known as: VITAMIN B1 Take 1 tablet (100 mg total) by mouth daily. Start taking on: February 07, 2024               Discharge Care Instructions  (From admission, onward)           Start     Ordered   02/06/24 0000  Discharge wound  care:       Comments: 02/04/24 2000    Wound care  Daily      Comments: Cleanse around G tube with NS, apply silver  hydrofiber Soila [phone] Aquacel AG) to red moist areas daily (may cut Aquacel like a split gauze and use around G tube).  If skin is irritated from drainage ok to  apply Desitin 2 times daily as needed.     If G tube is draining and not contained by silver  would try Drawtex superabsorbent dressing cut like a split gauze Gerlean #[id number]  02/04/24 1839    02/03/24 1914    Monitor biopsy site every 15 minutes x 4, every 30 minutes x 2, then per unit routine  Once       02/03/24 1913   02/06/24 1645           No Known Allergies    The results of significant diagnostics from this hospitalization (including imaging, microbiology, ancillary and laboratory) are listed below for reference.    Significant Diagnostic Studies: IR IMAGING GUIDED PORT INSERTION Result Date: 02/06/2024 CLINICAL DATA:  Peritoneal carcinomatosis and pancreatic mass. Port-A-Cath needed for chemotherapy. EXAM: IMPLANTED PORT A CATH PLACEMENT WITH ULTRASOUND AND FLUOROSCOPIC GUIDANCE ANESTHESIA/SEDATION: Moderate (conscious) sedation was employed during this procedure. A total of Versed  3.0 mg and Fentanyl  100 mcg was administered intravenously. Moderate Sedation Time: 30 minutes. The patient's level of consciousness and vital signs were monitored continuously by radiology nursing throughout the procedure under my direct supervision. FLUOROSCOPY: Radiation Exposure Index: 1.0 mGy Kerma PROCEDURE: The procedure, risks, benefits, and alternatives were explained to the patient. Questions regarding the procedure were encouraged and answered. The patient understands and consents to the procedure. A time-out was performed prior to initiating the procedure. Ultrasound was utilized to confirm patency of the right internal jugular vein. An ultrasound image was saved and recorded. The right neck and chest were prepped  with chlorhexidine  in a sterile fashion, and a sterile drape was applied covering the operative field. Maximum barrier sterile technique with sterile gowns and gloves were used for the procedure. Local anesthesia was provided with 1% lidocaine . After creating a small venotomy incision, a 21 gauge needle was advanced into the right internal jugular vein under direct, real-time ultrasound guidance. Ultrasound image documentation was performed. After securing guidewire access, an 8 Fr dilator was placed. A J-wire was kinked to measure appropriate catheter length. A subcutaneous port pocket was then created along the upper chest wall utilizing sharp and blunt dissection. Portable cautery was utilized. The pocket was irrigated with sterile saline. A single lumen power injectable port was chosen for placement. The 8 Fr catheter was tunneled from the port pocket site to the  venotomy incision. The port was placed in the pocket. External catheter was trimmed to appropriate length based on guidewire measurement. At the venotomy, an 8 Fr peel-away sheath was placed over a guidewire. The catheter was then placed through the sheath and the sheath removed. Final catheter positioning was confirmed and documented with a fluoroscopic spot image. The port was accessed with a needle and aspirated and flushed with heparinized saline. The access needle was removed. The venotomy and port pocket incisions were closed with subcutaneous 3-0 Monocryl and subcuticular 4-0 Vicryl. Dermabond was applied to both incisions. COMPLICATIONS: COMPLICATIONS None FINDINGS: After catheter placement, the tip lies at the cavo-atrial junction. The catheter aspirates normally and is ready for immediate use. IMPRESSION: Placement of single lumen port a cath via right internal jugular vein. The catheter tip lies at the cavo-atrial junction. A power injectable port a cath was placed and is ready for immediate use. Electronically Signed   By: Marcey Moan  M.D.   On: 02/06/2024 15:05   IR Replc Duoden/Jejuno Tube Percut W/Fluoro Result Date: 02/03/2024 INDICATION: Leaking catheter. History of pancreatic mass with duodenal ureteral stricture/obstruction. Surgical gastrostomy and jejunostomy tubes placed 01/10/2024. Reported J-tube leakage. EXAM: JEJUNOSTOMY TUBE EXCHANGE and UPSIZE COMPARISON:  KUB, 02/02/2024. CT AP, 02/02/2024. IR fluoroscopy, 01/20/2024. MEDICATIONS: None CONTRAST:  20mL OMNIPAQUE  IOHEXOL  300 MG/ML  SOLN FLUOROSCOPY: Radiation Exposure Index and estimated peak skin dose (PSD); Reference air kerma (RAK), 6.6 mGy. Kerma-area product (KAP), 122.96 uGy*m. COMPLICATIONS: None immediate. PROCEDURE: Informed written consent was obtained from the patient and/or patient's representative after a discussion of the risks, benefits and alternatives to treatment. Questions regarding the procedure were encouraged and answered. A timeout was performed prior to the initiation of the procedure. The site of prior jejunostomy tube was prepped and draped in the usual sterile fashion, and a sterile drape was applied covering the operative field. Maximum barrier sterile technique with sterile gowns and gloves were used for the procedure. A timeout was performed prior to the initiation of the procedure. Contrast injection confirmed appropriate intraluminal positioning. The jejunostomy tube was cannulated with a stiff Glidewire and under intermittent fluoroscopic guidance, the catheter was exchanged for a new 24 Fr jejunostomy tube. The retention balloon was inflated and the balloon was cinched. Contrast was injected and several spot fluoroscopic images were obtained in various obliquities confirming intraluminal positioning. The patient tolerated the procedure well without immediate postprocedural complication. IMPRESSION: Successful fluoroscopic guided exchange and upsize to a 24 Fr jejunostomy tube. The new jejunostomy tube is ready for immediate use. RECOMMENDATIONS:  The patient will return to Vascular Interventional Radiology (VIR) for routine feeding tube evaluation and exchange in 6 months. Thom Hall, MD [facility] Radiology Electronically Signed   By: Thom Hall M.D.   On: 02/03/2024 15:48   US  EKG Site Rite Result Date: 02/03/2024 If Site Rite image not attached, placement could not be confirmed due to current cardiac rhythm.  CT ABDOMINAL MASS BIOPSY Result Date: 02/03/2024 INDICATION: 742006 Metastatic disease (HCC) 742006 history of breast cancer with peritoneal mass EXAM: CT-GUIDED PERITONEAL MASS BIOPSY COMPARISON:  CT AP, 02/02/2024. MEDICATIONS: 50 mg Benadryl  IV ANESTHESIA/SEDATION: Moderate (conscious) sedation was employed during this procedure. A total of Versed  2 mg and Fentanyl  100 mcg was administered intravenously. Moderate Sedation Time: 21 minutes. The patient's level of consciousness and vital signs were monitored continuously by radiology nursing throughout the procedure under my direct supervision. CONTRAST:  None. COMPLICATIONS: None immediate. PROCEDURE: RADIATION DOSE REDUCTION:  This exam was performed according to the departmental dose-optimization program which includes automated exposure control, adjustment of the mA and/or kV according to patient size and/or use of iterative reconstruction technique. Informed consent was obtained from the patient following an explanation of the procedure, risks, benefits and alternatives. A time out was performed prior to the initiation of the procedure. The patient was positioned supine on the CT table and a limited CT was performed for procedural planning demonstrating peritoneal mass at the LEFT lower quadrant. The procedure was planned. The operative site was prepped and draped in the usual sterile fashion. Appropriate trajectory was confirmed with a 22 gauge spinal needle after the adjacent tissues were anesthetized with 1% Lidocaine  with  epinephrine . Under intermittent CT guidance, a 17 gauge coaxial needle was advanced into the peripheral aspect of the mass. Appropriate positioning was confirmed and 3 samples were obtained with an 18 gauge core needle biopsy device. The co-axial needle was removed and hemostasis was achieved with manual compression. A limited postprocedural CT was negative for hemorrhage or additional complication. A dressing was placed. The patient tolerated the procedure well without immediate postprocedural complication. IMPRESSION: Successful CT guided core needle biopsy of peritoneal mass. Thom Hall, MD [facility] Radiology Electronically Signed   By: Thom Hall M.D.   On: 02/03/2024 14:56   DG Chest Port 1 View Result Date: 02/03/2024 CLINICAL DATA:  Status post PICC catheter insertion EXAM: PORTABLE CHEST 1 VIEW COMPARISON:  Chest radiograph February 02, 2024 FINDINGS: Cardiomediastinal silhouette is mildly accentuated due to low lung volumes. No new pulmonary opacity. Mild blunting of left costophrenic angle likely positional. Interval placement of left PICC catheter tip terminating in distal SVC. Right-sided PICC catheter is removed. No acute osseous abnormality. Surgical clips overlying the left lower chest. IMPRESSION: Left-sided PICC catheter tip terminates in distal SVC. Interval removal of right PICC catheter. Electronically Signed   By: Megan  Zare M.D.   On: 02/03/2024 14:53   IR REPLACE G-TUBE COMPLEX WO FLUORO (TRACT REV) Result Date: 02/02/2024 INDICATION: Patient with a history of pancreatic mass with duodenal stricture/obstruction. She underwent G-tube placement and J-tube placement 01/10/2024 by surgery. Patient was seen in IR January 20, 2024 and underwent G-tube exchange/upsize to a 24 [place] balloon retention catheter due to leaking. The 22 French balloon retention jejunostomy tube was repositioned. IR now asked to evaluate patient due to leaking from  the gastrostomy and the jejunostomy. EXAM: Gastrostomy tube exchange MEDICATIONS: None ANESTHESIA/SEDATION: None CONTRAST:  None FLUOROSCOPY: None COMPLICATIONS: None immediate. PROCEDURE: The patient was assessed at the bedside and the gastrostomy tube tract was found to be dilated. The 24 French balloon retention gastrostomy tube was removed and exchanged for a 28 French balloon retention gastrostomy tube. Post exchange x-ray shows the gastrostomy tube to be well positioned within the gastric lumen. The jejunostomy site was also found to be enlarged and CT imaging shows the retention balloon has been retracted within the tract within the anterior abdominal wall. IMPRESSION: Technically successful bedside gastrostomy tube exchange from a 24 [place] to a 28 [place] balloon retention. Patient will come to IR for jejunostomy exchange/reposition. Electronically Signed   By: Ester Sides M.D.   On: 02/02/2024 15:03   DG ABDOMEN PEG TUBE LOCATION Result Date: 02/02/2024 CLINICAL DATA:  Gastrostomy tube complication. EXAM: ABDOMEN - 1 VIEW COMPARISON:  January 17, 2024. FINDINGS: No abnormal bowel dilatation is noted. Distal tip of gastrostomy tube appears to be within gastric lumen with normal  contrast filling of the stomach. No extravasation or leakage is noted. Small amount of contrast is noted in nondilated proximal small bowel. IMPRESSION: Gastrostomy tube tip appears to be within gastric lumen. Electronically Signed   By: Lynwood Landy Raddle M.D.   On: 02/02/2024 13:19   DG CHEST PORT 1 VIEW Result Date: 02/02/2024 CLINICAL DATA:  Status post PICC placement. EXAM: PORTABLE CHEST 1 VIEW COMPARISON:  January 03, 2024. FINDINGS: The heart size and mediastinal contours are within normal limits. Right-sided PICC line is noted with catheter misdirected into the right internal jugular vein. Both lungs are clear. The visualized skeletal structures are unremarkable. IMPRESSION: Right-sided PICC line is noted with catheter going  retrograde into right internal jugular vein. Repositioning is recommended. These results will be called to the ordering clinician or representative by the Radiologist Assistant, and communication documented in the PACS or zVision Dashboard. Electronically Signed   By: Lynwood Landy Raddle M.D.   On: 02/02/2024 13:17   US  EKG SITE RITE Result Date: 02/02/2024 If Site Rite image not attached, placement could not be confirmed due to current cardiac rhythm.  CT ABDOMEN PELVIS W CONTRAST Result Date: 02/02/2024 CLINICAL DATA:  Pancreatic cancer EXAM: CT ABDOMEN AND PELVIS WITH CONTRAST TECHNIQUE: Multidetector CT imaging of the abdomen and pelvis was performed using the standard protocol following bolus administration of intravenous contrast. RADIATION DOSE REDUCTION: This exam was performed according to the departmental dose-optimization program which includes automated exposure control, adjustment of the mA and/or kV according to patient size and/or use of iterative reconstruction technique. CONTRAST:  75mL OMNIPAQUE  IOHEXOL  350 MG/ML SOLN COMPARISON:  None Available. FINDINGS: Lower chest: No acute abnormality. Hepatobiliary: Interval development of mild ascites with trace pericholecystic fluid. No superimposed pericholecystic inflammatory changes gallbladder wall thickening identified. Liver unremarkable. Progressive extrahepatic biliary ductal dilation with the extrahepatic bile duct measuring up to 15 mm in diameter. The extrahepatic bile duct demonstrates an abrupt cut off in the region the pancreatic head in the expected location of the pancreatic head mass better seen on MRI examination of 12/23/2023. This mass itself is not visualized on this examination however. Pancreas: Known pancreatic head mass is not well visualized. No pancreatic duct dilation. No peripancreatic inflammatory changes or fluid collections are seen. Spleen: Unremarkable Adrenals/Urinary Tract: Adrenal glands are unremarkable. The kidneys  are normal in size and position. Progressive, moderate right hydronephrosis and proximal hydroureter to the level of the mid ureter. No intrarenal or ureteral calculi are identified in the findings may relate to an underlying intrinsic stricture (55/3). Left kidney is unremarkable. Bladder is unremarkable. Stomach/Bowel: Pre 18 gastrostomy has been replaced with the balloon retention gastrostomy catheter in appropriate position within the gastric lumen. Gastrojejunostomy again noted. Percutaneous jejunostomy catheter is in place, however, the retaining balloon has been further withdrawn and is now within the tract within the anterior abdominal wall (53/3). Pancolonic diverticulosis is again identified, most severe within the ascending colon without superimposed acute inflammatory change. No evidence of obstruction. There is increasing infiltration of the omentum keeping with progressive care peritoneal carcinomatosis. The previously identified peritoneal nodule with left lower quadrant has enlarged measuring 14 x 23 mm (45/3). Vascular/Lymphatic: There is effacement of the fat plane between the head of the pancreas and the inferior vena cava with narrowing of the inferior vena cava (43/3) suspicious for developing case mint of the vessel by the adjacent pancreatic malignancy this vessel remains patent, however. The abdominal vasculature is otherwise unremarkable. No pathologic adenopathy identified. Reproductive: Status post  hysterectomy. No adnexal masses. Other: No abdominal wall hernia Musculoskeletal: Numerous sclerotic metastases are again identified within the visualized axial skeleton with pathologic fracture of the L3 vertebral body with mild loss of height, similar prior examination. IMPRESSION: 1. Known pancreatic head mass is not well visualized on this examination. Progressive extrahepatic biliary ductal dilation with abrupt cut off in the region of the pancreatic head in the expected location of the  pancreatic head mass better seen on MRI examination of 12/23/2023. 2. Progressive, moderate right hydronephrosis and proximal hydroureter to the level of the mid ureter. No intrarenal or ureteral calculi are identified and the findings may relate to an underlying intrinsic stricture. 3. Progressive peritoneal carcinomatosis. 4. Interval development of mild ascites. 5. Percutaneous jejunostomy catheter is in place, however, the retaining balloon has been further withdrawn and is now within the tract within the anterior abdominal wall. 6. Stable osseous metastatic disease with pathologic fracture of the L3 vertebral body with mild loss of height. 7. Pancolonic diverticulosis without superimposed acute inflammatory change. Electronically Signed   By: Dorethia Molt M.D.   On: 02/02/2024 05:10   IR Cm Inj Any Colonic Tube W/Fluoro Result Date: 01/23/2024 INDICATION: History of pancreatic mass and duodenal obstruction with indwelling venting gastrostomy tube and feeding percutaneous jejunostomy tube. CT last night demonstrated retraction of the balloon retention gastrostomy tube out of the stomach and into the gastrostomy tract and possible mild retraction of the jejunostomy retention balloon. There clinically is also significant leakage of gastric contents/secretions around the percutaneous gastrostomy tube. EXAM: 1. EXCHANGE OF PERCUTANEOUS GASTROSTOMY TUBE UNDER FLUOROSCOPY 2. INJECTION AND MANIPULATION OF PERCUTANEOUS JEJUNOSTOMY UNDER FLUOROSCOPY MEDICATIONS: None ANESTHESIA/SEDATION: None CONTRAST:  30 mL on-administered into the gastric lumen. FLUOROSCOPY: Radiation Exposure Index (as provided by the fluoroscopic device): 11.0 mGy Kerma COMPLICATIONS: None immediate. PROCEDURE: Informed written consent was obtained from the patient after a thorough discussion of the procedural risks, benefits and alternatives. All questions were addressed. Maximal Sterile Barrier Technique was utilized including caps, mask,  sterile gowns, sterile gloves, sterile drape, hand hygiene and skin antiseptic. A timeout was performed prior to the initiation of the procedure. A pre-existing 22 French balloon retention gastrostomy tube was injected with contrast material under fluoroscopy. The tube was then cut and removed over a guidewire. A new 24 French balloon retention gastrostomy tube was then advanced over the wire and into the stomach. The retention balloon was inflated with 10 mL of saline. The tube was injected with contrast to confirm position and a fluoroscopic image saved. The indwelling 22 French balloon retention jejunostomy tube was injected with contrast material under fluoroscopy. The retention balloon was deflated and the tube slightly advanced. The retention balloon was then inflated with 7 mL of saline and the tube than brought back to tension at the skin. FINDINGS: As noted on the CT study, the retention balloon of the indwelling 22 French balloon retention gastrostomy tube is just outside of the gastric lumen. There is still opacification of the stomach with injection of contrast and no contrast entering the peritoneal cavity. There is quite a bit of leakage of gastric secretion and contents around the 22 French tube and up sizing was performed to 24 [place] in order to try to decrease leakage around the gastrostomy. A 24 French tube was easily advanced into the stomach through the pre-existing tract with the tip located in the body of the stomach after replacement. The jejunostomy tube is widely patent and advanced well into the jejunum. Due to  CT findings of the balloon potentially starting to migrate out of the jejunum, manipulation was performed with aspiration yielding only approximately 4 mL of fluid from the retention balloon. After deflation of the balloon and advancement of the tube, the retention balloon was inflated with 7 mL of fluid. IMPRESSION: 1. Replacement of dislodged 22 French balloon retention  gastrostomy tube with a slightly larger 24 French balloon retention catheter. The tube lies in the body of the stomach and will continue to be used for venting purposes. 2. Manipulation of 22 French balloon retention jejunostomy with slight advancement of the catheter after deflation of the balloon and reinflation of the retention balloon as above. This catheter is appropriately positioned and widely patent. Electronically Signed   By: Marcey Moan M.D.   On: 01/23/2024 15:35   IR REPLACE G-TUBE COMPLEX WO FLUORO (TRACT REV) Result Date: 01/20/2024 INDICATION: History of pancreatic mass and duodenal obstruction with indwelling venting gastrostomy tube and feeding percutaneous jejunostomy tube. CT last night demonstrated retraction of the balloon retention gastrostomy tube out of the stomach and into the gastrostomy tract and possible mild retraction of the jejunostomy retention balloon. There clinically is also significant leakage of gastric contents/secretions around the percutaneous gastrostomy tube. EXAM: 1. EXCHANGE OF PERCUTANEOUS GASTROSTOMY TUBE UNDER FLUOROSCOPY 2. INJECTION AND MANIPULATION OF PERCUTANEOUS JEJUNOSTOMY UNDER FLUOROSCOPY MEDICATIONS: None ANESTHESIA/SEDATION: None CONTRAST:  30 mL on-administered into the gastric lumen. FLUOROSCOPY: Radiation Exposure Index (as provided by the fluoroscopic device): 11.0 mGy Kerma COMPLICATIONS: None immediate. PROCEDURE: Informed written consent was obtained from the patient after a thorough discussion of the procedural risks, benefits and alternatives. All questions were addressed. Maximal Sterile Barrier Technique was utilized including caps, mask, sterile gowns, sterile gloves, sterile drape, hand hygiene and skin antiseptic. A timeout was performed prior to the initiation of the procedure. A pre-existing 22 French balloon retention gastrostomy tube was injected with contrast material under fluoroscopy. The tube was then cut and removed over a  guidewire. A new 24 French balloon retention gastrostomy tube was then advanced over the wire and into the stomach. The retention balloon was inflated with 10 mL of saline. The tube was injected with contrast to confirm position and a fluoroscopic image saved. The indwelling 22 French balloon retention jejunostomy tube was injected with contrast material under fluoroscopy. The retention balloon was deflated and the tube slightly advanced. The retention balloon was then inflated with 7 mL of saline and the tube than brought back to tension at the skin. FINDINGS: As noted on the CT study, the retention balloon of the indwelling 22 French balloon retention gastrostomy tube is just outside of the gastric lumen. There is still opacification of the stomach with injection of contrast and no contrast entering the peritoneal cavity. There is quite a bit of leakage of gastric secretion and contents around the 22 French tube and up sizing was performed to 24 [place] in order to try to decrease leakage around the gastrostomy. A 24 French tube was easily advanced into the stomach through the pre-existing tract with the tip located in the body of the stomach after replacement. The jejunostomy tube is widely patent and advanced well into the jejunum. Due to CT findings of the balloon potentially starting to migrate out of the jejunum, manipulation was performed with aspiration yielding only approximately 4 mL of fluid from the retention balloon. After deflation of the balloon and advancement of the tube, the retention balloon was inflated with 7 mL of fluid. IMPRESSION: 1. Replacement of  dislodged 22 French balloon retention gastrostomy tube with a slightly larger 24 French balloon retention catheter. The tube lies in the body of the stomach and will continue to be used for venting purposes. 2. Manipulation of 22 French balloon retention jejunostomy with slight advancement of the catheter after deflation of the balloon and  reinflation of the retention balloon as above. This catheter is appropriately positioned and widely patent. Electronically Signed   By: Marcey Moan M.D.   On: 01/20/2024 16:25   CT ABDOMEN PELVIS W CONTRAST Result Date: 01/19/2024 CLINICAL DATA:  Left lower quadrant abdominal pain around the feeding tube site with leaking from the feeding tube. History of breast cancer with suspected pancreatic lesion. * Tracking Code: BO * EXAM: CT ABDOMEN AND PELVIS WITH CONTRAST TECHNIQUE: Multidetector CT imaging of the abdomen and pelvis was performed using the standard protocol following bolus administration of intravenous contrast. RADIATION DOSE REDUCTION: This exam was performed according to the departmental dose-optimization program which includes automated exposure control, adjustment of the mA and/or kV according to patient size and/or use of iterative reconstruction technique. CONTRAST:  OMNIPAQUE  IOHEXOL  300 MG/ML  SOLN COMPARISON:  CT abdomen and pelvis dated 12/16/2023 FINDINGS: Lower chest: No focal consolidation or pulmonary nodule in the lung bases. Decreased small right pleural effusion. Partially imaged heart size is normal. Hepatobiliary: The superior most aspect of the hepatic dome is not included within the field of view. No focal hepatic lesions. No intra or extrahepatic biliary ductal dilation. Normal gallbladder. Pancreas: Similar appearance of the pancreas. No main pancreatic ductal dilation. Spleen: Normal in size without focal abnormality. Adrenals/Urinary Tract: No adrenal nodules. No suspicious renal mass, calculi or hydronephrosis. Interval resolution of previously noted right hydronephrosis. Bilateral subcentimeter hypodensities, previously characterized as simple cysts. No focal bladder wall thickening. Stomach/Bowel: Small hiatal hernia. Postsurgical changes of the stomach with patent gastrojejunostomy. Percutaneous gastrostomy balloon has retracted into the subcutaneous soft tissues  of the anterior left upper quadrant (2:26) with resulting gas-filled tract to the stomach (2:25). The T tacks of the gastrostomy are present within the subcutaneous soft tissues. There are small foci of gas surrounding the displaced gastrostomy balloon. Left lower quadrant approach jejunostomy tube with slightly retracted appearance of the balloon, which appears to protrude slightly into the abdominal musculature (2:43). Jejunostomy tube now terminates within a right lower quadrant bowel loop. No evidence of bowel wall thickening, distention, or inflammatory changes. Colonic diverticulosis without acute diverticulitis. Normal appendix. Vascular/Lymphatic: No significant vascular findings are present. No enlarged abdominal or pelvic lymph nodes. Increased size and conspicuity of a peripherally enhancing 1.4 x 1.2 cm nodule along the peripheral lateral left lower peritoneum (2:38) compared to 01/15/2024 when it was subtly seen and measured 1.0 x 0.7 cm in retrospect. Reproductive: No adnexal masses. Other: Small volume pelvic free fluid. No free air or fluid collection. Mild thickening of the inferior peritoneum (10:92). Musculoskeletal: Extensive osseous sclerotic lesions. Similar compression deformity of L3. IMPRESSION: 1. Percutaneous gastrostomy balloon has retracted into the subcutaneous soft tissues of the anterior left upper abdomen with resulting gas-filled tract to the stomach. The T tacks of the gastrostomy are present within the subcutaneous soft tissues surrounding the displaced balloon. 2. Left lower quadrant approach jejunostomy tube with slightly retracted appearance of the balloon, which appears to protrude slightly into the abdominal musculature. Jejunostomy tube now terminates within a right lower quadrant bowel loop. 3. Increased size and conspicuity of a peripherally enhancing 1.4 x 1.2 cm nodule along the peripheral lateral  left lower peritoneum, suspicious for peritoneal metastasis. 4. Mild  thickening of the inferior peritoneum, which may be reactive or represent peritoneal carcinomatosis. 5. Decreased small right pleural effusion. 6. Extensive osseous sclerotic lesions, consistent with metastatic disease. Electronically Signed   By: Limin  Xu M.D.   On: 01/19/2024 21:45   DG Abd 1 View Result Date: 01/17/2024 CLINICAL DATA:  Abdominal pain. EXAM: ABDOMEN - 1 VIEW COMPARISON:  Abdominal radiograph dated 01/09/2024. FINDINGS: Percutaneous gastrostomy with balloon in the epigastric area to the left of the spine. No bowel dilatation. Air is noted in the colon. No acute osseous pathology. IMPRESSION: Nonobstructive bowel gas pattern. Electronically Signed   By: Vanetta Chou M.D.   On: 01/17/2024 15:24   CT CELIAC PLEXUS BLOCK ANESTHETIC Result Date: 01/16/2024 CLINICAL DATA:  Severe abdominal pain, incompletely controlled. Concern of possible pancreatic lesion affecting the duodenum, biopsies negative. EXAM: CELIAC PLEXUS BLOCK  UNDER CT GUIDANCE ANESTHESIA/SEDATION: Intravenous Fentanyl  100mcg and Versed  2mg  were administered by RN during a total moderate (conscious) sedation time of 22 minutes; the patient's level of consciousness and physiological / cardiorespiratory status were monitored continuously by radiology RN under my direct supervision. TECHNIQUE: Select scans through the abdomen were obtained and an appropriate entry sites identified. Moderate sedation was initiated. Skin sites marked, prepped with chlorhexidine , and draped in usual sterile fashion, and infiltrated locally with 1% lidocaine . Under CT fluoroscopic guidance, a 22-gauge Chiba needle was advanced just superior to the origin of the celiac axis, to the left of midline. Diagnostic injection of 5 ml dilute Omnipaque -180 shows good spread in the region of the celiac plexus to the left of midline. In similar fashion, a 22 gauge Chiba needle was advanced to the level of the celiac plexus to the right of midline. Test injection  of 5 mL dilute Omnipaque  180 shows good spread. Subsequently 10mL of Sensorcaine  0.5% were then administered, divided evenly between the 2 sites, and the needle was removed. Followup scanning shows dilution and spread of the previous administered contrast bilaterally in the expected region of the celiac plexus. No evidence of hemorrhage or other apparent complication. Patient tolerated the procedure well. IMPRESSION: 1. Technically successful celiac plexus block  under CT guidance. PLAN: If the patient has excellent pain response, and subsequent recurrence of pain, we could proceed with permanent celiac neurolysis. Electronically Signed   By: JONETTA Faes M.D.   On: 01/16/2024 16:28   CT ABDOMEN PELVIS W CONTRAST Result Date: 01/15/2024 CLINICAL DATA:  Abdominal pain with nausea.  J tube feeding. EXAM: CT ABDOMEN AND PELVIS WITH CONTRAST TECHNIQUE: Multidetector CT imaging of the abdomen and pelvis was performed using the standard protocol following bolus administration of intravenous contrast. RADIATION DOSE REDUCTION: This exam was performed according to the departmental dose-optimization program which includes automated exposure control, adjustment of the mA and/or kV according to patient size and/or use of iterative reconstruction technique. CONTRAST:  OMNIPAQUE  IOHEXOL  300 MG/ML  SOLN COMPARISON:  CT abdomen and pelvis 12/29/2023. FINDINGS: Lower chest: There is atelectasis in the lung bases. There are small bilateral pleural effusions, right greater than left. Hepatobiliary: No focal liver abnormality is seen. No gallstones, gallbladder wall thickening, or biliary dilatation. Pancreas: Fullness of the humeral head appears unchanged from prior. Pancreas otherwise appears within normal limits. Spleen: Normal in size without focal abnormality. Adrenals/Urinary Tract: Subcentimeter cysts are identified in the left kidney. There is mild right-sided hydronephrosis without obstructing calculus identified.  There is no left-sided hydronephrosis. The adrenal glands and  bladder are within normal limits. The bladder is distended. Stomach/Bowel: The appendix appears normal. There is no free intraperitoneal air. There is diffuse colonic diverticulosis. Percutaneous jejunostomy tube in place. There is some mildly dilated jejunal loop centrally without transition point. Contrast is seen beyond dilated small bowel. Percutaneous gastrostomy tube in place. Stomach is nondilated. Vascular/Lymphatic: No significant vascular findings are present. No enlarged abdominal or pelvic lymph nodes. Reproductive: Status post hysterectomy. No adnexal masses. Other: There is a small amount of free fluid in the abdomen and pelvis. There is some air within the anterior right abdominal wall. There is diffuse body wall edema. Musculoskeletal: Sclerotic osseous metastatic disease and chronic compression fracture of L3 appear unchanged. IMPRESSION: 1. Percutaneous jejunostomy tube in place. There is some mildly dilated jejunal loop centrally without transition point. Findings may represent ileus or partial small bowel obstruction. 2. Small amount of free fluid in the abdomen and pelvis. 3. Small bilateral pleural effusions, right greater than left. 4. Diffuse body wall edema. 5. Mild right-sided hydronephrosis without obstructing calculus identified. 6. Distended bladder. 7. Colonic diverticulosis. 8. Sclerotic osseous metastatic disease. Electronically Signed   By: Greig Pique M.D.   On: 01/15/2024 19:45   DG Abd Portable 1V Result Date: 01/09/2024 CLINICAL DATA:  Vomiting for the past 2 days. EXAM: PORTABLE ABDOMEN - 1 VIEW COMPARISON:  01/08/2024 FINDINGS: Normal bowel-gas pattern. Mild-to-moderate lumbar spine degenerative changes. Postsurgical changes in the left mid and upper abdomen. IMPRESSION: No acute abnormality. Electronically Signed   By: Elspeth Bathe M.D.   On: 01/09/2024 10:18   DG Abd Portable 1V Result Date:  01/08/2024 CLINICAL DATA:  Ileus EXAM: PORTABLE ABDOMEN - 1 VIEW COMPARISON:  Upper GI 01/06/2024.  CT 12/29/2023 FINDINGS: Gas is seen in nondilated loops of large bowel. Scattered stool. Air towards the rectum. Minimal small bowel gas. Nonspecific. No obstruction. No obvious free air on this portable supine radiograph. Of note the diaphragm is clipped off the edge of the film. Degenerative changes along the spine. IMPRESSION: Nonspecific bowel gas pattern.  Overall minimal small bowel gas. Electronically Signed   By: Ranell Bring M.D.   On: 01/08/2024 10:27    Microbiology: Recent Results (from the past 240 hours)  Culture, blood (x 2)     Status: None (Preliminary result)   Collection Time: 02/04/24  1:34 PM   Specimen: BLOOD RIGHT ARM  Result Value Ref Range Status   Specimen Description BLOOD RIGHT ARM  Final   Special Requests   Final    BOTTLES DRAWN AEROBIC AND ANAEROBIC Blood Culture results may not be optimal due to an inadequate volume of blood received in culture bottles   Culture   Final    NO GROWTH 2 DAYS Performed at [facility], [address] [place], [place] [address]    Report Status PENDING  Incomplete  Culture, blood (x 2)     Status: None (Preliminary result)   Collection Time: 02/04/24  1:34 PM   Specimen: BLOOD LEFT HAND  Result Value Ref Range Status   Specimen Description BLOOD LEFT HAND  Final   Special Requests   Final    BOTTLES DRAWN AEROBIC AND ANAEROBIC Blood Culture results may not be optimal due to an inadequate volume of blood received in culture bottles   Culture   Final    NO GROWTH 2 DAYS Performed at [facility], [address], [place], [place] [address]    Report Status PENDING  Incomplete  Labs: Basic Metabolic Panel: Recent Labs  Lab 02/02/24 0615 02/03/24 0337 02/04/24 1129 02/05/24 0727 02/06/24 0435  NA 139 137 137 136 134*  K 3.7 3.6 4.1 4.3 4.2  CL 107 102 105 108 105  CO2 25 25 21* 19* 23  GLUCOSE 94 93 93  88 87  BUN 5* <5* <5* 5* <5*  CREATININE 0.61 0.49 0.60 0.54 0.63  CALCIUM  6.9* 7.1* 7.3* 7.7* 8.0*  MG 1.9 1.9 1.2* 1.7 1.8  PHOS 2.8 3.7 3.2 2.8 2.8   Liver Function Tests: Recent Labs  Lab 01/31/24 1147 02/01/24 1826 02/02/24 0615 02/04/24 1129  AST 43* 46* 74* 55*  ALT 62* 66* 74* 96*  ALKPHOS 642* 540* 518* 603*  BILITOT 0.7 0.6 0.8 0.7  PROT 7.3 6.6 5.4* 6.2*  ALBUMIN  3.7 2.9* 2.3* 2.6*   No results for input(s): LIPASE, AMYLASE in the last 168 hours. No results for input(s): AMMONIA in the last 168 hours. CBC: Recent Labs  Lab 01/31/24 1147 02/01/24 1826 02/02/24 0615 02/02/24 1041 02/03/24 0337 02/04/24 1129  WBC 4.1 4.2 4.0  --  4.4 5.4  NEUTROABS 2.2 2.2  --   --   --  3.6  HGB 8.4* 8.4* 6.7* 7.9* 8.7* 10.2*  HCT 25.3* 26.3* 21.6* 25.2* 26.4* 31.7*  MCV 71.3* 73.3* 75.8*  --  79.0* 79.4*  PLT 176 173 146*  --  122* 118*   Cardiac Enzymes: No results for input(s): CKTOTAL, CKMB, CKMBINDEX, TROPONINI in the last 168 hours. BNP: BNP (last 3 results) No results for input(s): BNP in the last 8760 hours.  ProBNP (last 3 results) No results for input(s): PROBNP in the last 8760 hours.  CBG: Recent Labs  Lab 02/05/24 1543 02/05/24 2100 02/06/24 0919 02/06/24 1218 02/06/24 1601  GLUCAP 70 94 78 91 94    Signed:  Colen Grimes MD   [facility] 02/06/2024, 4:48 PM

## 2024-02-06 NOTE — Progress Notes (Signed)
 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 Reviecing TPN presently. Palliative care involved for goals of care conversations. Chelsea Hansen is established with [facility] Pall[facility] and was last seen 8/12.   Today's Discussion 02/06/2024  *Please note that this is a verbal dictation therefore any spelling or grammatical errors are due to the Dragon Medical One system interpretation.  Chart reviewed inclusive of vital signs, progress notes, laboratory results, and diagnostic images.   Per nursing staff, Chelsea Hansen has been eating well.   I met with Chelsea Hansen at bedside this morning. She shares that she has had some nausea. We discussed getting her an antiemetic. She did not have an IV form therefore compazine  was ordered.   We reviewed Chelsea Hansen's present pain has been around a 5/10 predominantly in her abdomen but sometime she also notes pain in her back. She has an ostomy pouch at j tube site. She and I reviewed the importance of medication adherence. We discussed the use of fentanyl  patches to support pain control more consistently and using the percocet's as needed. We reviewed trying in the hospital to veer away from IV medications unless pain is not controlled with oral PRNs.   Chelsea Hansen and I discussed her following up with the OP Palliative Oncology clinic for continued symptom support.   Chelsea Hansen shares she would like to include two more people on her HCPOA paperwork. I shared I would alert our chaplain.   Created space and opportunity for patient to explore thoughts feelings and fears regarding current medical situation. We discussed how hard this situation has been on her. We reviewed the possibility of pancreatic cancer and the fears associated with this.  A pen and pad was provided  for Chelsea Hansen to write down questions.   Questions and concerns addressed/Palliative Support Provided.   Objective Assessment: Vital Signs Vitals:   02/06/24 0430 02/06/24 0833  BP: 126/77 117/80  Pulse: 70 70  Resp: 16 18  Temp: 98.2 F (36.8 C) (!) 97.5 F (36.4 C)  SpO2: 100% 100%    Intake/Output Summary (Last 24 hours) at 02/06/2024 1251 Last data filed at 02/06/2024 9062 Gross per 24 hour  Intake 1804.88 ml  Output 100 ml  Net 1704.88 ml   Last Weight  Most recent update: 02/05/2024  5:39 AM    Weight  83.3 kg (183 lb 10.3 oz)            Gen:  Middle aged AA F chronically ill appearing HEENT: moist mucous membranes CV: Regular rate and rhythm  PULM: On RA breathing is even and nonlabored ABD: soft, (+) g-tube, (+) LUQ ostomy pouch EXT: No edema  Neuro: Alert and oriented x3   SUMMARY OF RECOMMENDATIONS   Full code / Full scope of care    Patients goals are to improve and she is willing to accept any and all treatments   GI pathology is pending   Appreciate chaplain support - patient asking to add two more HCPOA's   Ongoing PMT support   Code Status/Advance Care Planning: FULL CODE   Symptom Management:  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: - Gabapentin  300mg  PO TID - Morphine  2mg  IVP Q2H PRN only to be used if orals are not effective - Percocet 7.5-325mg  1 -2  tabs PO Q4H PRN moderate to  severe pain - Continue fentanyl  patch 12mcg/hr  Constipation  - Senna 2 Tabs PO BID - Miralax  17g PO BID   Nausea  - Compazine  10mg  PO/IV Q6H PRN - G-Tube with venting as needed ______________________________________________________________________________________ Chelsea Hansen Palliative Medicine Team Team Cell Phone: [phone] Please utilize secure chat with additional questions, if there is no response within 30 minutes please call the above phone number  Time Spent: 65 Billing based on MDM:  High  Palliative Medicine Team providers are available by phone from 7am to 7pm daily and can be reached through the team cell phone.  Should this patient require assistance outside of these hours, please call the patient's attending physician.

## 2024-02-06 NOTE — Plan of Care (Signed)
   Problem: Coping: Goal: Level of anxiety will decrease Outcome: Progressing   Problem: Pain Managment: Goal: General experience of comfort will improve and/or be controlled Outcome: Progressing

## 2024-02-06 NOTE — Progress Notes (Addendum)
 Nutrition Follow-up  DOCUMENTATION CODES:   Severe malnutrition in context of chronic illness  INTERVENTION:   Encourage PO intake - Currently on regular diet  Room service with assist 48 hour Calorie count  Boost Breeze po BID, each supplement provides 250 kcal and 9 grams of protein Ensure Max po daily, each supplement provides 150 kcal and 30 grams of protein.  Mighty Shake TID with meals, each supplement provides 330 kcals and 9 grams of protein 1 packet Juven BID, each packet provides 95 calories, 2.5 grams of protein (collagen), to support wound healing MVI daily 100 mg Thiamine  x 7 days   High calorie, high protein handout in AVS  *If pt cannot tolerate diet PO, recommend starting feeds via G-tube. If pt does not tolerate G-tube feeds, pt may need long term TPN.   NUTRITION DIAGNOSIS:   Severe Malnutrition related to cancer and cancer related treatments as evidenced by severe muscle depletion, energy intake < or equal to 75% for > or equal to 1 month, moderate muscle depletion. - Ongoing   GOAL:   Patient will meet greater than or equal to 90% of their needs - Progressing   MONITOR:   PO intake, Supplement acceptance, Diet advancement, Labs, TF tolerance  REASON FOR ASSESSMENT:   Consult Assessment of nutrition requirement/status, Enteral/tube feeding initiation and management, New TPN/TNA  ASSESSMENT:  53 y.o. female with complex medical and surgical history. Briefly, patient has metastatic breast cancer to bone. 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 Ultimately underwent GJ bypass on 7/14.Then underwent Gtube and Jtube placement on 7/22. Hospital course complicated by intolerance of Jtube feeds, need for TPN. Eventually left hospital AMA. . Admitted with malfunction G-tube/Jtube with possible ilesu/SBO.Worsening cancer, electolyte abmoralitites, Nausea, vomiting, abdominal pain.    Prior Admission: 7/3: admitted, clear liquids 7/5: FLD 7/6: CLD 7/7: NPO, s/p EUS 7/8: CLD, s/p EGD 7/9: TPN initiated 7/10: NPO 7/11: NGT placed for LIS 7/12: TPN advanced to goal 7/14: s/p gastrojejonostomy 7/15: FLD 7/22: s/p G-tube and J-tube placement; CLD; TF's initiated @ 20 7/23 TF stopped overnight for emesis 7/24 TF stopped overnight for emesis 7/25 TF stopped overnight for emesis; Surgery put in to start TF at 15mL/hr but patient refused 7/26 TF to be held indefinitely 7/28 TPN to increase back to goal to meet nutritional needs 7/29 -Pt left AMA   Outpatient: 8/1: Gtube dislodged, replaced by IR  8/8: Discomfort and irritation around gtube drain site.   Current admission: 8/13: Admitted  8/14: CT scan showed progressive peritoneal carcinomatosis,  mild ascites, progressive extrahepatic biliary ductal dilatation, progressive moderate right hydronephrosis, retracted retention balloon of jejunostomy tube, developing encasement of IVC. TPN start at 15mL/hr  8/15 - SOFT diet, trickle tube feeds started  8/16 - J tube removed d/t obstruction, pt lost PICC access, TPN stopped   Pt started trickle tube feeds of Vital 1.5 at 10 ml/hr on 8/15. J- tube was removed d/t obstruction on 8/16.  Pt was on TPN from 8/14 to 8/16 where pt lost PICC access and TPN was stopped. Pt irritable on visit. Reports she has been eating mostly 100% of her meals and has a good appetite. Has not had to use her G-tube for venting. She is upset this morning as she is NPO for port placement for chemo. Pt not agreeable to many ONS as she does not like them. Has tried Ensures in the past and did not like. States she  would if she had too. Pt wanted to try Boost Breeze. Encouraged pt to increase PO intake and provided ways to increase caloric density of foods at home.   No N/V, or abdominal pain. Discussed plan with surgery PA. TPN on hold for now, calorie count to access PO intake. If needed and if pt  tolerates G-tube feeds may be considered.   8/17:  BF: 100% eggs + sausage + potatoes (280 kcal, 19 gm protein) Lunch: 10% fries, 25% tuna salad sandwich, 1/2 spaghetti (350 kcal, 15 gm protein) Dinner: 100% turkey + mashed potatoes (250 kcal, 18 gm protein)  Total intake: 880 kcal (44% of minimum estimated needs)  52 gm protein (52% of minimum estimated needs)  Patient is currently enrolled in [facility] outpatient-based palliative care. Pt wants full aggressive care.    Intake/Output Summary (Last 24 hours) at 02/06/2024 1422 Last data filed at 02/06/2024 9062 Gross per 24 hour  Intake 1684.88 ml  Output 100 ml  Net 1584.88 ml   Drains/Lines: G-tube  Admit weight: Admit weight: 80 kg - Suspect fluid is contributing to increased weight  Current weight: 83.3 kg    Average Meal Intake: 8/15-8/17: 40% intake x 6 recorded meals  Nutritionally Relevant Medications: Scheduled Meds:  [START ON 02/07/2024] feeding supplement  1 Container Oral BID BM   liver oil-zinc  oxide   Topical BID   multivitamin with minerals  1 tablet Oral Daily   nutrition supplement (JUVEN)  1 packet Oral BID BM   pantoprazole   40 mg Oral BID   polyethylene glycol  17 g Oral BID   Ensure Max Protein  11 oz Oral Daily   senna  2 tablet Oral BID   thiamine   100 mg Oral Daily   Labs Reviewed: Sodium 134 BUN <5 Calcium  8.0 CBG ranges from 70-107 mg/dL over the last 24 hours No A1C  Diet Order:   Diet Order             Diet regular Room service appropriate? Yes with Assist; Fluid consistency: Thin  Diet effective now                   EDUCATION NEEDS:   Education needs have been addressed  Skin:  Skin Assessment: Skin Integrity Issues: Skin Integrity Issues:: Stage II, Incisions Stage II: Left and right buttocks Incisions: Surgical incisions  Last BM:  8/17/, type 4  Height:   Ht Readings from Last 1 Encounters:  02/02/24 5' 11 (1.803 m)    Weight:   Wt Readings from Last 1  Encounters:  02/05/24 83.3 kg    Ideal Body Weight:  70.5 kg  BMI:  Body mass index is 25.61 kg/m.  Estimated Nutritional Needs:   Kcal:  2000-2300 kcal/day  Protein:  100-115 gm/day  Fluid:  >/=2L   Olivia Kenning, RD Registered Dietitian  See Amion for more information

## 2024-02-06 NOTE — Progress Notes (Signed)
 [facility] Spiritual Care Note  Referred by dietitian team for follow-up support due to complexity of clinical situation in the setting of this readmission. Charlet Givens briefly by phone to remind her of ongoing chaplain availability. We plan to follow up by phone in a few days to speak in more detail.   [address] [name], [place], [facility] Pager [phone] Voicemail [phone]

## 2024-02-06 NOTE — Plan of Care (Signed)

## 2024-02-06 NOTE — TOC CM/SW Note (Addendum)
 Was asked to arrange [facility] for venting G tube and pouch over old J tube site.   Patient provided contact information [facility] resource number: [phone]   NCM called [facility]. [facility] explained patient, is not active with them. They provide personnel care services but not skilled nursing services.   NCM called following agencies [place], [place], [place], [place], [place], [place] , [place] , [facility] and [place] cannot accept    [employer] exhausted list for home health agencies .   Patient and sister aware    Patient and sister will have PCP complete medicaid personnel care paperwork. Needs to come from PCP   Patient's step mother is a [employer] fundraiser and will provide assistance at home with G tube and pouch . Asked bedside nurse to give supplies at discharge . Team aware no HH RN

## 2024-02-06 NOTE — Care Plan (Signed)
 AVS printed and instructed to patient with questions asked and answered. Patient has complaints of pain but tolerable. Supplies handed to patient and verbalized that she is able to take care of he G-tube and pouch at home with her step mother's assistance. PIV removed. Pt transported safely to main entrance.

## 2024-02-06 NOTE — Discharge Instructions (Signed)
 High-Calorie, High-Protein Nutrition Therapy (2021) A high-calorie, high-protein diet has been recommended to you. Your registered dietitian nutritionist (RDN) may have recommended this diet because you are having difficulty eating enough calories throughout the day, you have lost weight, and/or you need to add protein to your diet. Sometimes you may not feel like eating, even if you know the importance of good nutrition. The recommendations in this handout can help you with the following: Regaining your strength and energy Keeping your body healthy Healing and recovering from surgery or illness and fighting infection Tips: Schedule Your Meals and Snacks Several small meals and snacks are often better tolerated and digested than large meals. Strategies Plan to eat 3 meals and 3 snacks daily. Experiment with timing meals to find out when you have a larger appetite. Appetite may be greatest in the morning after not eating all night so you may prefer to eat your larger meals and snacks in the morning and at lunch. Breakfast-type foods are often better tolerated so eat foods such as eggs, pancakes, waffles and cereal for any meal or snack. Carry snacks with you so you are prepared to eat every 2 to 3 hours. Determine what works best for you if your body's cues for feeling hungry or full are not working. Eat a small meal or snack even if you don't feel hungry. Set a timer to remind you when it is time to eat. Take a walk before you eat (with health care provider's approval). Light or moderate physical activity can help you maintain muscle and increase your appetite. Make Eating Enjoyable Taking steps to make the experience enjoyable may help to increase your interest in eating and improve your appetite. Strategies: Eat with others whenever possible. Include your favorite foods to make meals more enjoyable. Try new foods. Save your beverage for the end of the meal so that you have more room for  food before you get full. Add Calories to Your Meals and Snacks Try adding calorie-dense foods so that each bite provides more nutrition. Strategies Drink milk, chocolate milk, soy milk, or smoothies instead of low-calorie beverages such as diet drinks or water. Cook with milk or soy milk instead of water when making dishes such as hot cereal, cocoa, or pudding. Add jelly, jam, honey, butter or margarine to bread and crackers. Add jam or fruit to ice cream and as a topping over cake. Mix dried fruit, nuts, granola, honey, or dry cereal with yogurt or hot cereals. Enjoy snacks such as milkshakes, smoothies, pudding, ice cream, or custard. Blend a fruit smoothie of a banana, frozen berries, milk or soy milk, and 1 tablespoon nonfat powdered milk or protein powder. Add Protein to Your Meals and Snacks Choose at least one protein food at each meal and snack to increase your daily intake. Strategies Add  cup nonfat dry milk powder or protein powder to make a high-protein milk to drink or to use in recipes that call for milk. Vanilla or peppermint extract or unsweetened cocoa powder could help to boost the flavor. Add hard-cooked eggs, leftover meat, grated cheese, canned beans or tofu to noodles, rice, salads, sandwiches, soups, casseroles, pasta, tuna and other mixed dishes. Add powdered milk or protein powder to hot cereals, meatloaf, casseroles, scrambled eggs, sauces, cream soups, and shakes. Add beans and lentils to salads, soups, casseroles, and vegetable dishes. Eat cottage cheese or yogurt, especially Greek yogurt, with fruit as a snack or dessert. Eat peanut or other nut butters on crackers, bread, toast,  waffles, apples, bananas or celery sticks. Add it to milkshakes, smoothies, or desserts. Consider a ready-made protein shake. Your RDN will make recommendations. Add Fats to Your Meals and Snacks Try adding fats to your meals and snacks. Fat provides more calories in fewer bites than  carbohydrate or protein and adds flavors to your foods. Strategies Snack on nuts and seeds or add them to foods like salads, pasta, cereals, yogurt, and ice cream.  Saut or stir-fry vegetables, meats, chicken, fish or tofu in olive or canola oil.  Add olive oil, other vegetable oils, butter or margarine to soups, vegetables, potatoes, cooked cereal, rice, pasta, bread, crackers, pancakes, or waffles. Snack on olives or add to pasta, pizza, or salad. Add avocado or guacamole to your salads, sandwiches, and other entrees. Include fatty fish such as salmon in your weekly meal plan. For general food safety tips, especially for clients with immunocompromised conditions, ask your RDN for the Food Safety Nutrition Therapy handout. Small Meal and Snack Ideas These snacks and meals are recommended when you have to eat but aren't necessarily hungry.  They are good choices because they are high in protein and high in calories.  2 graham crackers 2 tablespoons peanut or other nut butter 1 cup milk 2 slices whole wheat toast topped with:  avocado, mashed Seasoning of your choice   cup Greek yogurt  cup fruit  cup granola 2 deviled egg halves 5 whole wheat crackers  1 cup cream of tomato soup  grilled cheese sandwich 1 toasted waffle topped with: 2 tablespoons peanut or nut butter 1 tablespoon jam  Trail mix made with:  cup nuts  cup dried fruit  cup cold cereal, any variety  cup oatmeal or cream of wheat cereal 1 tablespoon peanut or nut butter  cup diced fruit   High-Calorie, High-Protein Sample 1-Day Menu View Nutrient Info Breakfast 1 egg, scrambled 1 ounce cheddar cheese 1 English muffin, whole wheat 1 tablespoon margarine 1 tablespoon jam  cup orange juice, fortified with calcium and vitamin D  Morning Snack 1 tablespoon peanut butter 1 banana 1 cup 1% milk  Lunch Tuna salad sandwich made with: 2 slices bread, whole wheat 3 ounces tuna mixed with: 1 tablespoon  mayonnaise  cup pudding  Afternoon Snack  cup hummus  cup carrots 1 pita  Evening Meal Enchilada casserole made with: 2 corn tortillas 3 ounces ground beef, cooked  cup black beans, cooked  cup corn, cooked 1 ounce grated cheddar cheese  cup enchilada sauce  avocado, sliced, topping for enchilada 1 tablespoon sour cream, topping for enchilada Salad:  cup lettuce, shredded  cup tomatoes, chopped, for salad 1 tablespoon olive oil and vinegar dressing, for salad  Evening Snack  cup Greek yogurt  cup blueberries  cup granola

## 2024-02-06 NOTE — Progress Notes (Signed)
 Subjective: CC: Reports no current abdominal pain. Tolerating soft diet and eating close to 100% of her trays. No n/v. Reports she did not need to vent G-tube yesterday. BM yesterday.   Afebrile. No tachycardia or hypotension. WBC wnl.   Objective: Vital signs in last 24 hours: Temp:  [97.5 F (36.4 C)-99.4 F (37.4 C)] 97.5 F (36.4 C) (08/18 0833) Pulse Rate:  [70-85] 70 (08/18 0833) Resp:  [16-18] 18 (08/18 0833) BP: (117-131)/(77-88) 117/80 (08/18 0833) SpO2:  [99 %-100 %] 100 % (08/18 0833) Last BM Date : 02/05/24  Intake/Output from previous day: 08/17 0701 - 08/18 0700 In: 2034.9 [P.O.:820; I.V.:684; NG/GT:502.8; IV Piggyback:18] Out: 100 [Drains:100] Intake/Output this shift: Total I/O In: 10 [Other:10] Out: -   PE: Gen:  Alert, NAD, pleasant Abd: Soft, ND, NT, G-tube in place and clamped. Prior J tube site with eakin's pouch over this with small amount of tan appearing liquid in bag - 100cc/24 hours.   Lab Results:  Recent Labs    02/04/24 1129  WBC 5.4  HGB 10.2*  HCT 31.7*  PLT 118*   BMET Recent Labs    02/05/24 0727 02/06/24 0435  NA 136 134*  K 4.3 4.2  CL 108 105  CO2 19* 23  GLUCOSE 88 87  BUN 5* <5*  CREATININE 0.54 0.63  CALCIUM  7.7* 8.0*   PT/INR No results for input(s): LABPROT, INR in the last 72 hours. CMP     Component Value Date/Time   NA 134 (L) 02/06/2024 0435   NA 144 11/30/2023 1000   K 4.2 02/06/2024 0435   CL 105 02/06/2024 0435   CO2 23 02/06/2024 0435   GLUCOSE 87 02/06/2024 0435   BUN <5 (L) 02/06/2024 0435   BUN 19 11/30/2023 1000   CREATININE 0.63 02/06/2024 0435   CREATININE 0.95 01/31/2024 1147   CALCIUM  8.0 (L) 02/06/2024 0435   PROT 6.2 (L) 02/04/2024 1129   PROT 7.3 11/30/2023 1000   ALBUMIN  2.6 (L) 02/04/2024 1129   ALBUMIN  4.5 11/30/2023 1000   AST 55 (H) 02/04/2024 1129   AST 43 (H) 01/31/2024 1147   ALT 96 (H) 02/04/2024 1129   ALT 62 (H) 01/31/2024 1147   ALKPHOS 603 (H) 02/04/2024  1129   BILITOT 0.7 02/04/2024 1129   BILITOT 0.7 01/31/2024 1147   GFRNONAA >60 02/06/2024 0435   GFRNONAA >60 01/31/2024 1147   GFRAA >60 09/07/2019 1340   Lipase     Component Value Date/Time   LIPASE 133 (H) 01/19/2024 1858    Studies/Results: No results found.  Anti-infectives: Anti-infectives (From admission, onward)    None        Assessment/Plan Ms. Ogden is a 53 year old lady with a history of metastatic breast cancer and 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 Lyndel and subsequently underwent G tube and J tube placement 01/10/24 when GJ was not draining who was readmitted 8/14 for FTT and G and Jtube malfunction/malposition  - G tube replaced by IR 8/15. Vent as needed for nausea/vomiting - J tube exchanged and upsized 8/15 by IR. Now removed due to obstruction. Eakins pouch over site. Monitor output and treat this like ECF - 123ml/24 hours. Strict I/O.  - TF's off. TPN off. Diet as tolerated. Calorie Count.  - CT on admission w/ progressive peritoneal carcinomatosis. Peritoneal bx pending - Palliative care and medical oncology consults for goals of care discussions  FEN - Okay for soft diet. NPO for possible IR procedure VTE - SCDs, sqh ID - None currently.    LOS: 5 days    Ozell CHRISTELLA Shaper, [facility] 02/06/2024, 11:05 AM Please see Amion for pager number during day hours 7:00am-4:30pm

## 2024-02-06 NOTE — Consult Note (Signed)
 Chief Complaint: Metastatic BCA; pancreatic head mass  Referring Provider(s): Dr. Royal  Supervising Physician: Luverne Aran  Patient Status: [facility] - In-pt  History of Present Illness: Chelsea Hansen is a 53 y.o. female with past medical history significant for HTN, HLD, GERD, metastatic breast cancer (bone metastasis). 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 She underwent G-tube placement and J-tube placement 01/10/2024 by surgery. IR recently upsized G tube and J tube for leakage and CT noted presence of J tube balloon retracted into abd wall. On 8/15, IR completed peritoneal bx. On 8/16, J tube was removed by surgery.   Patient will be beginning outpatient chemo for pancreatic head mass. IR consulted for port placement. Patient was scheduled outpatient for celiac plexus block at [facility] today. This procedure will be rescheduled for another outpatient day.   She previously underwent port placement during BCA tx. This was a L sided port; clear if this placement was related to vessel anatomy or radiation plans as was not placed by [facility] radiology group.    Confirms NPO since MN.  Does not wear CPAP or use supplemental home O2.  Denies fever, chills, SOB, CP, sore throat, N/V. Does endorse abd fullness. She has been tolerating an oral diet well she reports, eager to eat after procedure today.   Allergies Reviewed:  Patient has no known allergies.   Patient is Full Code  Past Medical History:  Diagnosis Date   Allergy    seasonal allergies   Anxiety    on meds   Asthma    uses inhaler   Breast cancer (HCC) 2012   RIGHT lumpectomy   Cancer (HCC) 2022   RIGHT breast lump-dx 2022   Depression    on meds   DVT (deep venous thrombosis) (HCC) 2010   after hysterectomy   Family history of uterine cancer    GERD (gastroesophageal reflux disease)    with certain foods/OTC PRN meds    Headache(784.0)    History of radiation therapy    Bilateral breast- 09/03/21-11/02/21- Dr. Lynwood Nasuti   Hyperlipidemia    on meds   Hypertension    on meds   SVT (supraventricular tachycardia) (HCC)     Past Surgical History:  Procedure Laterality Date   ABDOMINAL HYSTERECTOMY     AXILLARY SENTINEL NODE BIOPSY Left 07/09/2021   Procedure: LEFT AXILLARY SENTINEL NODE BIOPSY;  Surgeon: [name], MD;  Location: [facility];  Service: General;  Laterality: Left;   BALLOON DILATION N/A 12/13/2023   Procedure: MERRILL HODGKIN;  Surgeon: Federico Rosario BROCKS, MD;  Location: [facility] END[facility]COPY;  Service: Gastroenterology;  Laterality: N/A;   BIOPSY  06/29/2022   Procedure: BIOPSY;  Surgeon: Legrand Victory LITTIE DOUGLAS, MD;  Location: [facility] ENDOSCOPY;  Service: Gastroenterology;;   BIOPSY OF SKIN SUBCUTANEOUS TISSUE AND/OR MUCOUS MEMBRANE  12/13/2023   Procedure: BIOPSY, SKIN, SUBCUTANEOUS TISSUE, OR MUCOUS MEMBRANE;  Surgeon: Federico Rosario BROCKS, MD;  Location: [facility] ENDOSCOPY;  Service: Gastroenterology;;   BREAST EXCISIONAL BIOPSY Right 09/16/2009   BREAST LUMPECTOMY WITH RADIOACTIVE SEED LOCALIZATION Bilateral 07/09/2021   Procedure: BILATERAL BREAST LUMPECTOMY WITH RADIOACTIVE SEED LOCALIZATION;  Surgeon: [name], MD;  Location: [facility];  Service: General;  Laterality: Bilateral;   BREAST SURGERY     lumpectomy   ESOPHAGOGASTRODUODENOSCOPY N/A 12/13/2023   Procedure: EGD (ESOPHAGOGASTRODUODENOSCOPY);  Surgeon: Federico Rosario BROCKS, MD;  Location: [facility] ENDOSCOPY;  Service: Gastroenterology;  Laterality:  N/A;   ESOPHAGOGASTRODUODENOSCOPY N/A 12/26/2023   Procedure: EGD (ESOPHAGOGASTRODUODENOSCOPY);  Surgeon: Wilhelmenia Aloha Raddle., MD;  Location: [facility];  Service: Gastroenterology;  Laterality: N/A;   ESOPHAGOGASTRODUODENOSCOPY N/A 12/27/2023   Procedure: EGD (ESOPHAGOGASTRODUODENOSCOPY);  Surgeon: Wilhelmenia Aloha Raddle., MD;  Location: [facility];  Service:  Gastroenterology;  Laterality: N/A;   ESOPHAGOGASTRODUODENOSCOPY (EGD) WITH PROPOFOL  N/A 06/29/2022   Procedure: ESOPHAGOGASTRODUODENOSCOPY (EGD) WITH PROPOFOL ;  Surgeon: Legrand Victory LITTIE DOUGLAS, MD;  Location: [facility];  Service: Gastroenterology;  Laterality: N/A;   EUS N/A 12/26/2023   Procedure: ULTRASOUND, UPPER GI TRACT, ENDOSCOPIC;  Surgeon: Wilhelmenia Aloha Raddle., MD;  Location: [facility];  Service: Gastroenterology;  Laterality: N/A;   EUS N/A 12/27/2023   Procedure: ULTRASOUND, UPPER GI TRACT, ENDOSCOPIC;  Surgeon: Wilhelmenia Aloha Raddle., MD;  Location: [facility];  Service: Gastroenterology;  Laterality: N/A;   IR CM INJ ANY COLONIC TUBE W/FLUORO  01/20/2024   IR REPLC DUODEN/JEJUNO TUBE PERCUT W/FLUORO  02/03/2024   LAPAROSCOPIC INSERTION GASTROSTOMY TUBE N/A 01/10/2024   Procedure: laparoscopic insertion of gastrostomy tube, laparoscopic insertion of jejunostomy tube, upper endoscopy;  Surgeon: Kinsinger, Herlene Righter, MD;  Location: [facility];  Service: General;  Laterality: N/A;  Laproscopic insertion   LAPAROSCOPIC ROUX-EN-Y GASTRIC BYPASS WITH HIATAL HERNIA REPAIR N/A 01/02/2024   Procedure: LAPAROSCOPIC CREATION GASTRJEJONOSTOMY;  Surgeon: Lyndel Deward PARAS, MD;  Location: [facility];  Service: General;  Laterality: N/A;  GASTROJEJUNOSTOMY   PORT-A-CATH REMOVAL Left 07/09/2021   Procedure: REMOVAL PORT-A-CATH;  Surgeon: [name], MD;  Location: [facility];  Service: General;  Laterality: Left;   PORTACATH PLACEMENT Left 01/26/2021   Procedure: INSERTION PORT-A-CATH;  Surgeon: [name], MD;  Location: [facility];  Service: General;  Laterality: Left;   RADIOACTIVE SEED GUIDED AXILLARY SENTINEL LYMPH NODE Right 07/09/2021   Procedure: RADIOACTIVE SEED GUIDED RIGHT AXILLARY SENTINEL LYMPH NODE DISSECTION;  Surgeon: [name], MD;  Location: [facility];  Service: General;  Laterality: Right;   TUBAL LIGATION        Medications: Prior  to Admission medications   Medication Sig Start Date End Date Taking? Authorizing Provider  oxyCODONE -acetaminophen  (PERCOCET) 7.5-325 MG tablet Take 1 tablet by mouth every 4 (four) hours as needed for severe pain (pain score 7-10)Take 1-2 tablets by mouth every 6 (six) hours as needed for severe pain (pain score 7-10).Max daily dose 8 tablets per provider. 02/04/24  Yes [name], [name], NP  abemaciclib  (VERZENIO ) 50 MG tablet Take 50 mg by mouth 2 (two) times daily. Patient not taking: Reported on 12/22/2023    [provider]  anastrozole  (ARIMIDEX ) 1 MG tablet Take 1 tablet (1 mg total) by mouth daily. Patient not taking: Reported on 01/25/2024 04/18/23   [name], NP  calcitRIOL  (ROCALTROL ) 0.5 MCG capsule Take 1 capsule (0.5 mcg total) by mouth daily. Patient not taking: Reported on 01/25/2024 12/15/23   [name], [name], MD  Calcium  Carb-Cholecalciferol  600-10 MG-MCG TABS Take 1 tablet by mouth 3 (three) times daily. Patient not taking: Reported on 01/25/2024 12/14/23 01/13/24  [name], [name], MD  gabapentin  (NEURONTIN ) 300 MG capsule Take 1 capsule (300 mg total) by mouth 3 (three) times daily. Patient not taking: Reported on 02/02/2024 05/26/23   [name], [name], MD  pantoprazole  (PROTONIX ) 40 MG tablet Take 1 tablet (40 mg total) by mouth 2 (two) times daily. Patient not taking: Reported on 02/02/2024 01/25/24   Causey, Lindsey Cornetto, NP  polyethylene glycol powder (GLYCOLAX /MIRALAX ) 17 GM/SCOOP powder Mix 17 g  in 4 oz of liquid and take by mouth 2 (two) times daily as needed. Patient not taking: Reported on 02/02/2024 11/30/23   [name], NP  prochlorperazine  (COMPAZINE ) 10 MG tablet Take 1 tablet (10 mg total) by mouth every 6 (six) hours as needed for nausea or vomiting. Patient not taking: Reported on 02/02/2024 01/25/24   [name], NP  propranolol  (INDERAL ) 10 MG tablet Take 1 tablet (10 mg total) by mouth 2 (two) times daily.  Please call [phone] to schedule an appointment for future refills. Thank you. 3rd attempt. Patient not taking: Reported on 12/13/2023 06/01/23   [name], [name], DO  scopolamine  (TRANSDERM-SCOP) 1 MG/3DAYS Place 1 patch (1.5 mg total) onto the skin behind ear every 3 (three) days as needed for dizziness, nausea or vomiting. Patient not taking: Reported on 02/02/2024 01/31/24   [name], Athena N, NP  senna (SENOKOT) 8.6 MG TABS tablet Take 2 tablets (17.2 mg total) by mouth 2 (two) times daily. Patient not taking: Reported on 02/02/2024 01/31/24   [place] [name], NP     Family History  Problem Relation Age of Onset   Breast cancer Mother        65s   Hypertension Father    Uterine cancer Maternal Aunt    Ovarian cancer Maternal Aunt    Breast cancer Maternal Aunt        36s   Breast cancer Cousin 34   Uterine cancer Cousin 18   Colon polyps Neg Hx    Colon cancer Neg Hx    Esophageal cancer Neg Hx    Stomach cancer Neg Hx     Social History   Socioeconomic History   Marital status: Single    Spouse name: Not on file   Number of children: Not on file   Years of education: Not on file   Highest education level: Not on file  Occupational History   Not on file  Tobacco Use   Smoking status: Never   Smokeless tobacco: Never  Vaping Use   Vaping status: Never Used  Substance and Sexual Activity   Alcohol use: Not Currently    Alcohol/week: 7.0 standard drinks of alcohol    Types: 7 Standard drinks or equivalent per week    Comment: occassional   Drug use: No   Sexual activity: Not Currently  Other Topics Concern   Not on file  Social History Narrative   Not on file   Social Drivers of Health   Financial Resource Strain: High Risk (01/14/2022)   Overall Financial Resource Strain (CARDIA)    Difficulty of Paying Living Expenses: Very hard  Food Insecurity: No Food Insecurity (02/02/2024)   Hunger Vital Sign    Worried About Running Out of Food in  the Last Year: Never true    Ran Out of Food in the Last Year: Never true  Recent Concern: Food Insecurity - Food Insecurity Present (12/28/2023)   Hunger Vital Sign    Worried About Running Out of Food in the Last Year: Sometimes true    Ran Out of Food in the Last Year: Sometimes true  Transportation Needs: No Transportation Needs (02/02/2024)   PRAPARE - Administrator, Civil Service (Medical): No    Lack of Transportation (Non-Medical): No  Physical Activity: Insufficiently Active (08/01/2023)   Exercise Vital Sign    Days of Exercise per Week: 1 day    Minutes of Exercise per Session: 10 min  Stress: Stress Concern Present (  10/10/2023)   [employer] of Occupational Health - Occupational Stress Questionnaire    Feeling of Stress : Rather much  Social Connections: Not on file     Review of Systems: A 12 point ROS discussed and pertinent positives are indicated in the HPI above.  All other systems are negative.   Vital Signs: BP 117/80 (BP Location: Right Arm)   Pulse 70   Temp (!) 97.5 F (36.4 C) (Oral)   Resp 18   Ht 5' 11 (1.803 m)   Wt 183 lb 10.3 oz (83.3 kg)   SpO2 100%   BMI 25.61 kg/m     Physical Exam HENT:     Mouth/Throat:     Mouth: Mucous membranes are moist.     Pharynx: Oropharynx is clear.  Cardiovascular:     Rate and Rhythm: Normal rate and regular rhythm.     Pulses: Normal pulses.     Heart sounds: Normal heart sounds.  Pulmonary:     Effort: Pulmonary effort is normal.     Breath sounds: Normal breath sounds.  Skin:    General: Skin is warm and dry.     Capillary Refill: Capillary refill takes less than 2 seconds.     Comments: No rash or wounds L or R chest/neck  Neurological:     Mental Status: She is alert and oriented to person, place, and time.  Psychiatric:        Mood and Affect: Mood normal.        Behavior: Behavior normal.        Thought Content: Thought content normal.        Judgment: Judgment normal.      Imaging: IR Replc Duoden/Jejuno Tube Percut W/Fluoro Result Date: 02/03/2024 INDICATION: Leaking catheter. History of pancreatic mass with duodenal ureteral stricture/obstruction. Surgical gastrostomy and jejunostomy tubes placed 01/10/2024. Reported J-tube leakage. EXAM: JEJUNOSTOMY TUBE EXCHANGE and UPSIZE COMPARISON:  KUB, 02/02/2024. CT AP, 02/02/2024. IR fluoroscopy, 01/20/2024. MEDICATIONS: None CONTRAST:  20mL OMNIPAQUE  IOHEXOL  300 MG/ML  SOLN FLUOROSCOPY: Radiation Exposure Index and estimated peak skin dose (PSD); Reference air kerma (RAK), 6.6 mGy. Kerma-area product (KAP), 122.96 uGy*m. COMPLICATIONS: None immediate. PROCEDURE: Informed written consent was obtained from the patient and/or patient's representative after a discussion of the risks, benefits and alternatives to treatment. Questions regarding the procedure were encouraged and answered. A timeout was performed prior to the initiation of the procedure. The site of prior jejunostomy tube was prepped and draped in the usual sterile fashion, and a sterile drape was applied covering the operative field. Maximum barrier sterile technique with sterile gowns and gloves were used for the procedure. A timeout was performed prior to the initiation of the procedure. Contrast injection confirmed appropriate intraluminal positioning. The jejunostomy tube was cannulated with a stiff Glidewire and under intermittent fluoroscopic guidance, the catheter was exchanged for a new 24 Fr jejunostomy tube. The retention balloon was inflated and the balloon was cinched. Contrast was injected and several spot fluoroscopic images were obtained in various obliquities confirming intraluminal positioning. The patient tolerated the procedure well without immediate postprocedural complication. IMPRESSION: Successful fluoroscopic guided exchange and upsize to a 24 Fr jejunostomy tube. The new jejunostomy tube is ready for immediate use. RECOMMENDATIONS: The patient  will return to Vascular Interventional Radiology (VIR) for routine feeding tube evaluation and exchange in 6 months. Thom Hall, MD [facility] Radiology Electronically Signed   By: Thom Hall M.D.   On: 02/03/2024 15:48  US  EKG Site Rite Result Date: 02/03/2024 If Site Rite image not attached, placement could not be confirmed due to current cardiac rhythm.  CT ABDOMINAL MASS BIOPSY Result Date: 02/03/2024 INDICATION: 742006 Metastatic disease (HCC) 742006 history of breast cancer with peritoneal mass EXAM: CT-GUIDED PERITONEAL MASS BIOPSY COMPARISON:  CT AP, 02/02/2024. MEDICATIONS: 50 mg Benadryl  IV ANESTHESIA/SEDATION: Moderate (conscious) sedation was employed during this procedure. A total of Versed  2 mg and Fentanyl  100 mcg was administered intravenously. Moderate Sedation Time: 21 minutes. The patient's level of consciousness and vital signs were monitored continuously by radiology nursing throughout the procedure under my direct supervision. CONTRAST:  None. COMPLICATIONS: None immediate. PROCEDURE: RADIATION DOSE REDUCTION: This exam was performed according to the departmental dose-optimization program which includes automated exposure control, adjustment of the mA and/or kV according to patient size and/or use of iterative reconstruction technique. Informed consent was obtained from the patient following an explanation of the procedure, risks, benefits and alternatives. A time out was performed prior to the initiation of the procedure. The patient was positioned supine on the CT table and a limited CT was performed for procedural planning demonstrating peritoneal mass at the LEFT lower quadrant. The procedure was planned. The operative site was prepped and draped in the usual sterile fashion. Appropriate trajectory was confirmed with a 22 gauge spinal needle after the adjacent tissues were anesthetized with 1% Lidocaine  with epinephrine . Under  intermittent CT guidance, a 17 gauge coaxial needle was advanced into the peripheral aspect of the mass. Appropriate positioning was confirmed and 3 samples were obtained with an 18 gauge core needle biopsy device. The co-axial needle was removed and hemostasis was achieved with manual compression. A limited postprocedural CT was negative for hemorrhage or additional complication. A dressing was placed. The patient tolerated the procedure well without immediate postprocedural complication. IMPRESSION: Successful CT guided core needle biopsy of peritoneal mass. Thom Hall, MD [facility] Radiology Electronically Signed   By: Thom Hall M.D.   On: 02/03/2024 14:56   DG Chest Port 1 View Result Date: 02/03/2024 CLINICAL DATA:  Status post PICC catheter insertion EXAM: PORTABLE CHEST 1 VIEW COMPARISON:  Chest radiograph February 02, 2024 FINDINGS: Cardiomediastinal silhouette is mildly accentuated due to low lung volumes. No new pulmonary opacity. Mild blunting of left costophrenic angle likely positional. Interval placement of left PICC catheter tip terminating in distal SVC. Right-sided PICC catheter is removed. No acute osseous abnormality. Surgical clips overlying the left lower chest. IMPRESSION: Left-sided PICC catheter tip terminates in distal SVC. Interval removal of right PICC catheter. Electronically Signed   By: Megan  Zare M.D.   On: 02/03/2024 14:53   IR REPLACE G-TUBE COMPLEX WO FLUORO (TRACT REV) Result Date: 02/02/2024 INDICATION: Patient with a history of pancreatic mass with duodenal stricture/obstruction. She underwent G-tube placement and J-tube placement 01/10/2024 by surgery. Patient was seen in IR January 20, 2024 and underwent G-tube exchange/upsize to a 24 [place] balloon retention catheter due to leaking. The 22 French balloon retention jejunostomy tube was repositioned. IR now asked to evaluate patient due to leaking from the gastrostomy and  the jejunostomy. EXAM: Gastrostomy tube exchange MEDICATIONS: None ANESTHESIA/SEDATION: None CONTRAST:  None FLUOROSCOPY: None COMPLICATIONS: None immediate. PROCEDURE: The patient was assessed at the bedside and the gastrostomy tube tract was found to be dilated. The 24 French balloon retention gastrostomy tube was removed and exchanged for a 28 French balloon retention gastrostomy tube. Post exchange x-ray shows the gastrostomy tube to be well  positioned within the gastric lumen. The jejunostomy site was also found to be enlarged and CT imaging shows the retention balloon has been retracted within the tract within the anterior abdominal wall. IMPRESSION: Technically successful bedside gastrostomy tube exchange from a 24 [place] to a 28 [place] balloon retention. Patient will come to IR for jejunostomy exchange/reposition. Electronically Signed   By: Ester Sides M.D.   On: 02/02/2024 15:03   DG ABDOMEN PEG TUBE LOCATION Result Date: 02/02/2024 CLINICAL DATA:  Gastrostomy tube complication. EXAM: ABDOMEN - 1 VIEW COMPARISON:  January 17, 2024. FINDINGS: No abnormal bowel dilatation is noted. Distal tip of gastrostomy tube appears to be within gastric lumen with normal contrast filling of the stomach. No extravasation or leakage is noted. Small amount of contrast is noted in nondilated proximal small bowel. IMPRESSION: Gastrostomy tube tip appears to be within gastric lumen. Electronically Signed   By: Lynwood Landy Raddle M.D.   On: 02/02/2024 13:19   DG CHEST PORT 1 VIEW Result Date: 02/02/2024 CLINICAL DATA:  Status post PICC placement. EXAM: PORTABLE CHEST 1 VIEW COMPARISON:  January 03, 2024. FINDINGS: The heart size and mediastinal contours are within normal limits. Right-sided PICC line is noted with catheter misdirected into the right internal jugular vein. Both lungs are clear. The visualized skeletal structures are unremarkable. IMPRESSION: Right-sided PICC line is noted with catheter going retrograde into right  internal jugular vein. Repositioning is recommended. These results will be called to the ordering clinician or representative by the Radiologist Assistant, and communication documented in the PACS or zVision Dashboard. Electronically Signed   By: Lynwood Landy Raddle M.D.   On: 02/02/2024 13:17   US  EKG SITE RITE Result Date: 02/02/2024 If Site Rite image not attached, placement could not be confirmed due to current cardiac rhythm.  CT ABDOMEN PELVIS W CONTRAST Result Date: 02/02/2024 CLINICAL DATA:  Pancreatic cancer EXAM: CT ABDOMEN AND PELVIS WITH CONTRAST TECHNIQUE: Multidetector CT imaging of the abdomen and pelvis was performed using the standard protocol following bolus administration of intravenous contrast. RADIATION DOSE REDUCTION: This exam was performed according to the departmental dose-optimization program which includes automated exposure control, adjustment of the mA and/or kV according to patient size and/or use of iterative reconstruction technique. CONTRAST:  75mL OMNIPAQUE  IOHEXOL  350 MG/ML SOLN COMPARISON:  None Available. FINDINGS: Lower chest: No acute abnormality. Hepatobiliary: Interval development of mild ascites with trace pericholecystic fluid. No superimposed pericholecystic inflammatory changes gallbladder wall thickening identified. Liver unremarkable. Progressive extrahepatic biliary ductal dilation with the extrahepatic bile duct measuring up to 15 mm in diameter. The extrahepatic bile duct demonstrates an abrupt cut off in the region the pancreatic head in the expected location of the pancreatic head mass better seen on MRI examination of 12/23/2023. This mass itself is not visualized on this examination however. Pancreas: Known pancreatic head mass is not well visualized. No pancreatic duct dilation. No peripancreatic inflammatory changes or fluid collections are seen. Spleen: Unremarkable Adrenals/Urinary Tract: Adrenal glands are unremarkable. The kidneys are normal in size and  position. Progressive, moderate right hydronephrosis and proximal hydroureter to the level of the mid ureter. No intrarenal or ureteral calculi are identified in the findings may relate to an underlying intrinsic stricture (55/3). Left kidney is unremarkable. Bladder is unremarkable. Stomach/Bowel: Pre 18 gastrostomy has been replaced with the balloon retention gastrostomy catheter in appropriate position within the gastric lumen. Gastrojejunostomy again noted. Percutaneous jejunostomy catheter is in place, however, the retaining balloon has been further withdrawn and is now  within the tract within the anterior abdominal wall (53/3). Pancolonic diverticulosis is again identified, most severe within the ascending colon without superimposed acute inflammatory change. No evidence of obstruction. There is increasing infiltration of the omentum keeping with progressive care peritoneal carcinomatosis. The previously identified peritoneal nodule with left lower quadrant has enlarged measuring 14 x 23 mm (45/3). Vascular/Lymphatic: There is effacement of the fat plane between the head of the pancreas and the inferior vena cava with narrowing of the inferior vena cava (43/3) suspicious for developing case mint of the vessel by the adjacent pancreatic malignancy this vessel remains patent, however. The abdominal vasculature is otherwise unremarkable. No pathologic adenopathy identified. Reproductive: Status post hysterectomy. No adnexal masses. Other: No abdominal wall hernia Musculoskeletal: Numerous sclerotic metastases are again identified within the visualized axial skeleton with pathologic fracture of the L3 vertebral body with mild loss of height, similar prior examination. IMPRESSION: 1. Known pancreatic head mass is not well visualized on this examination. Progressive extrahepatic biliary ductal dilation with abrupt cut off in the region of the pancreatic head in the expected location of the pancreatic head mass  better seen on MRI examination of 12/23/2023. 2. Progressive, moderate right hydronephrosis and proximal hydroureter to the level of the mid ureter. No intrarenal or ureteral calculi are identified and the findings may relate to an underlying intrinsic stricture. 3. Progressive peritoneal carcinomatosis. 4. Interval development of mild ascites. 5. Percutaneous jejunostomy catheter is in place, however, the retaining balloon has been further withdrawn and is now within the tract within the anterior abdominal wall. 6. Stable osseous metastatic disease with pathologic fracture of the L3 vertebral body with mild loss of height. 7. Pancolonic diverticulosis without superimposed acute inflammatory change. Electronically Signed   By: Dorethia Molt M.D.   On: 02/02/2024 05:10   IR Cm Inj Any Colonic Tube W/Fluoro Result Date: 01/23/2024 INDICATION: History of pancreatic mass and duodenal obstruction with indwelling venting gastrostomy tube and feeding percutaneous jejunostomy tube. CT last night demonstrated retraction of the balloon retention gastrostomy tube out of the stomach and into the gastrostomy tract and possible mild retraction of the jejunostomy retention balloon. There clinically is also significant leakage of gastric contents/secretions around the percutaneous gastrostomy tube. EXAM: 1. EXCHANGE OF PERCUTANEOUS GASTROSTOMY TUBE UNDER FLUOROSCOPY 2. INJECTION AND MANIPULATION OF PERCUTANEOUS JEJUNOSTOMY UNDER FLUOROSCOPY MEDICATIONS: None ANESTHESIA/SEDATION: None CONTRAST:  30 mL on-administered into the gastric lumen. FLUOROSCOPY: Radiation Exposure Index (as provided by the fluoroscopic device): 11.0 mGy Kerma COMPLICATIONS: None immediate. PROCEDURE: Informed written consent was obtained from the patient after a thorough discussion of the procedural risks, benefits and alternatives. All questions were addressed. Maximal Sterile Barrier Technique was utilized including caps, mask, sterile gowns, sterile  gloves, sterile drape, hand hygiene and skin antiseptic. A timeout was performed prior to the initiation of the procedure. A pre-existing 22 French balloon retention gastrostomy tube was injected with contrast material under fluoroscopy. The tube was then cut and removed over a guidewire. A new 24 French balloon retention gastrostomy tube was then advanced over the wire and into the stomach. The retention balloon was inflated with 10 mL of saline. The tube was injected with contrast to confirm position and a fluoroscopic image saved. The indwelling 22 French balloon retention jejunostomy tube was injected with contrast material under fluoroscopy. The retention balloon was deflated and the tube slightly advanced. The retention balloon was then inflated with 7 mL of saline and the tube than brought back to tension at the skin. FINDINGS: As  noted on the CT study, the retention balloon of the indwelling 22 French balloon retention gastrostomy tube is just outside of the gastric lumen. There is still opacification of the stomach with injection of contrast and no contrast entering the peritoneal cavity. There is quite a bit of leakage of gastric secretion and contents around the 22 French tube and up sizing was performed to 24 [place] in order to try to decrease leakage around the gastrostomy. A 24 French tube was easily advanced into the stomach through the pre-existing tract with the tip located in the body of the stomach after replacement. The jejunostomy tube is widely patent and advanced well into the jejunum. Due to CT findings of the balloon potentially starting to migrate out of the jejunum, manipulation was performed with aspiration yielding only approximately 4 mL of fluid from the retention balloon. After deflation of the balloon and advancement of the tube, the retention balloon was inflated with 7 mL of fluid. IMPRESSION: 1. Replacement of dislodged 22 French balloon retention gastrostomy tube with a slightly  larger 24 French balloon retention catheter. The tube lies in the body of the stomach and will continue to be used for venting purposes. 2. Manipulation of 22 French balloon retention jejunostomy with slight advancement of the catheter after deflation of the balloon and reinflation of the retention balloon as above. This catheter is appropriately positioned and widely patent. Electronically Signed   By: Marcey Moan M.D.   On: 01/23/2024 15:35   IR REPLACE G-TUBE COMPLEX WO FLUORO (TRACT REV) Result Date: 01/20/2024 INDICATION: History of pancreatic mass and duodenal obstruction with indwelling venting gastrostomy tube and feeding percutaneous jejunostomy tube. CT last night demonstrated retraction of the balloon retention gastrostomy tube out of the stomach and into the gastrostomy tract and possible mild retraction of the jejunostomy retention balloon. There clinically is also significant leakage of gastric contents/secretions around the percutaneous gastrostomy tube. EXAM: 1. EXCHANGE OF PERCUTANEOUS GASTROSTOMY TUBE UNDER FLUOROSCOPY 2. INJECTION AND MANIPULATION OF PERCUTANEOUS JEJUNOSTOMY UNDER FLUOROSCOPY MEDICATIONS: None ANESTHESIA/SEDATION: None CONTRAST:  30 mL on-administered into the gastric lumen. FLUOROSCOPY: Radiation Exposure Index (as provided by the fluoroscopic device): 11.0 mGy Kerma COMPLICATIONS: None immediate. PROCEDURE: Informed written consent was obtained from the patient after a thorough discussion of the procedural risks, benefits and alternatives. All questions were addressed. Maximal Sterile Barrier Technique was utilized including caps, mask, sterile gowns, sterile gloves, sterile drape, hand hygiene and skin antiseptic. A timeout was performed prior to the initiation of the procedure. A pre-existing 22 French balloon retention gastrostomy tube was injected with contrast material under fluoroscopy. The tube was then cut and removed over a guidewire. A new 24 French balloon  retention gastrostomy tube was then advanced over the wire and into the stomach. The retention balloon was inflated with 10 mL of saline. The tube was injected with contrast to confirm position and a fluoroscopic image saved. The indwelling 22 French balloon retention jejunostomy tube was injected with contrast material under fluoroscopy. The retention balloon was deflated and the tube slightly advanced. The retention balloon was then inflated with 7 mL of saline and the tube than brought back to tension at the skin. FINDINGS: As noted on the CT study, the retention balloon of the indwelling 22 French balloon retention gastrostomy tube is just outside of the gastric lumen. There is still opacification of the stomach with injection of contrast and no contrast entering the peritoneal cavity. There is quite a bit of leakage of gastric secretion and  contents around the 22 French tube and up sizing was performed to 24 [place] in order to try to decrease leakage around the gastrostomy. A 24 French tube was easily advanced into the stomach through the pre-existing tract with the tip located in the body of the stomach after replacement. The jejunostomy tube is widely patent and advanced well into the jejunum. Due to CT findings of the balloon potentially starting to migrate out of the jejunum, manipulation was performed with aspiration yielding only approximately 4 mL of fluid from the retention balloon. After deflation of the balloon and advancement of the tube, the retention balloon was inflated with 7 mL of fluid. IMPRESSION: 1. Replacement of dislodged 22 French balloon retention gastrostomy tube with a slightly larger 24 French balloon retention catheter. The tube lies in the body of the stomach and will continue to be used for venting purposes. 2. Manipulation of 22 French balloon retention jejunostomy with slight advancement of the catheter after deflation of the balloon and reinflation of the retention balloon as  above. This catheter is appropriately positioned and widely patent. Electronically Signed   By: Marcey Moan M.D.   On: 01/20/2024 16:25   CT ABDOMEN PELVIS W CONTRAST Result Date: 01/19/2024 CLINICAL DATA:  Left lower quadrant abdominal pain around the feeding tube site with leaking from the feeding tube. History of breast cancer with suspected pancreatic lesion. * Tracking Code: BO * EXAM: CT ABDOMEN AND PELVIS WITH CONTRAST TECHNIQUE: Multidetector CT imaging of the abdomen and pelvis was performed using the standard protocol following bolus administration of intravenous contrast. RADIATION DOSE REDUCTION: This exam was performed according to the departmental dose-optimization program which includes automated exposure control, adjustment of the mA and/or kV according to patient size and/or use of iterative reconstruction technique. CONTRAST:  OMNIPAQUE  IOHEXOL  300 MG/ML  SOLN COMPARISON:  CT abdomen and pelvis dated 12/16/2023 FINDINGS: Lower chest: No focal consolidation or pulmonary nodule in the lung bases. Decreased small right pleural effusion. Partially imaged heart size is normal. Hepatobiliary: The superior most aspect of the hepatic dome is not included within the field of view. No focal hepatic lesions. No intra or extrahepatic biliary ductal dilation. Normal gallbladder. Pancreas: Similar appearance of the pancreas. No main pancreatic ductal dilation. Spleen: Normal in size without focal abnormality. Adrenals/Urinary Tract: No adrenal nodules. No suspicious renal mass, calculi or hydronephrosis. Interval resolution of previously noted right hydronephrosis. Bilateral subcentimeter hypodensities, previously characterized as simple cysts. No focal bladder wall thickening. Stomach/Bowel: Small hiatal hernia. Postsurgical changes of the stomach with patent gastrojejunostomy. Percutaneous gastrostomy balloon has retracted into the subcutaneous soft tissues of the anterior left upper quadrant  (2:26) with resulting gas-filled tract to the stomach (2:25). The T tacks of the gastrostomy are present within the subcutaneous soft tissues. There are small foci of gas surrounding the displaced gastrostomy balloon. Left lower quadrant approach jejunostomy tube with slightly retracted appearance of the balloon, which appears to protrude slightly into the abdominal musculature (2:43). Jejunostomy tube now terminates within a right lower quadrant bowel loop. No evidence of bowel wall thickening, distention, or inflammatory changes. Colonic diverticulosis without acute diverticulitis. Normal appendix. Vascular/Lymphatic: No significant vascular findings are present. No enlarged abdominal or pelvic lymph nodes. Increased size and conspicuity of a peripherally enhancing 1.4 x 1.2 cm nodule along the peripheral lateral left lower peritoneum (2:38) compared to 01/15/2024 when it was subtly seen and measured 1.0 x 0.7 cm in retrospect. Reproductive: No adnexal masses. Other: Small volume pelvic free fluid.  No free air or fluid collection. Mild thickening of the inferior peritoneum (10:92). Musculoskeletal: Extensive osseous sclerotic lesions. Similar compression deformity of L3. IMPRESSION: 1. Percutaneous gastrostomy balloon has retracted into the subcutaneous soft tissues of the anterior left upper abdomen with resulting gas-filled tract to the stomach. The T tacks of the gastrostomy are present within the subcutaneous soft tissues surrounding the displaced balloon. 2. Left lower quadrant approach jejunostomy tube with slightly retracted appearance of the balloon, which appears to protrude slightly into the abdominal musculature. Jejunostomy tube now terminates within a right lower quadrant bowel loop. 3. Increased size and conspicuity of a peripherally enhancing 1.4 x 1.2 cm nodule along the peripheral lateral left lower peritoneum, suspicious for peritoneal metastasis. 4. Mild thickening of the inferior peritoneum,  which may be reactive or represent peritoneal carcinomatosis. 5. Decreased small right pleural effusion. 6. Extensive osseous sclerotic lesions, consistent with metastatic disease. Electronically Signed   By: Limin  Xu M.D.   On: 01/19/2024 21:45   DG Abd 1 View Result Date: 01/17/2024 CLINICAL DATA:  Abdominal pain. EXAM: ABDOMEN - 1 VIEW COMPARISON:  Abdominal radiograph dated 01/09/2024. FINDINGS: Percutaneous gastrostomy with balloon in the epigastric area to the left of the spine. No bowel dilatation. Air is noted in the colon. No acute osseous pathology. IMPRESSION: Nonobstructive bowel gas pattern. Electronically Signed   By: Vanetta Chou M.D.   On: 01/17/2024 15:24   CT CELIAC PLEXUS BLOCK ANESTHETIC Result Date: 01/16/2024 CLINICAL DATA:  Severe abdominal pain, incompletely controlled. Concern of possible pancreatic lesion affecting the duodenum, biopsies negative. EXAM: CELIAC PLEXUS BLOCK  UNDER CT GUIDANCE ANESTHESIA/SEDATION: Intravenous Fentanyl  100mcg and Versed  2mg  were administered by RN during a total moderate (conscious) sedation time of 22 minutes; the patient's level of consciousness and physiological / cardiorespiratory status were monitored continuously by radiology RN under my direct supervision. TECHNIQUE: Select scans through the abdomen were obtained and an appropriate entry sites identified. Moderate sedation was initiated. Skin sites marked, prepped with chlorhexidine , and draped in usual sterile fashion, and infiltrated locally with 1% lidocaine . Under CT fluoroscopic guidance, a 22-gauge Chiba needle was advanced just superior to the origin of the celiac axis, to the left of midline. Diagnostic injection of 5 ml dilute Omnipaque -180 shows good spread in the region of the celiac plexus to the left of midline. In similar fashion, a 22 gauge Chiba needle was advanced to the level of the celiac plexus to the right of midline. Test injection of 5 mL dilute Omnipaque  180 shows good  spread. Subsequently 10mL of Sensorcaine  0.5% were then administered, divided evenly between the 2 sites, and the needle was removed. Followup scanning shows dilution and spread of the previous administered contrast bilaterally in the expected region of the celiac plexus. No evidence of hemorrhage or other apparent complication. Patient tolerated the procedure well. IMPRESSION: 1. Technically successful celiac plexus block  under CT guidance. PLAN: If the patient has excellent pain response, and subsequent recurrence of pain, we could proceed with permanent celiac neurolysis. Electronically Signed   By: JONETTA Faes M.D.   On: 01/16/2024 16:28   CT ABDOMEN PELVIS W CONTRAST Result Date: 01/15/2024 CLINICAL DATA:  Abdominal pain with nausea.  J tube feeding. EXAM: CT ABDOMEN AND PELVIS WITH CONTRAST TECHNIQUE: Multidetector CT imaging of the abdomen and pelvis was performed using the standard protocol following bolus administration of intravenous contrast. RADIATION DOSE REDUCTION: This exam was performed according to the departmental dose-optimization program which includes automated exposure control, adjustment of  the mA and/or kV according to patient size and/or use of iterative reconstruction technique. CONTRAST:  OMNIPAQUE  IOHEXOL  300 MG/ML  SOLN COMPARISON:  CT abdomen and pelvis 12/29/2023. FINDINGS: Lower chest: There is atelectasis in the lung bases. There are small bilateral pleural effusions, right greater than left. Hepatobiliary: No focal liver abnormality is seen. No gallstones, gallbladder wall thickening, or biliary dilatation. Pancreas: Fullness of the humeral head appears unchanged from prior. Pancreas otherwise appears within normal limits. Spleen: Normal in size without focal abnormality. Adrenals/Urinary Tract: Subcentimeter cysts are identified in the left kidney. There is mild right-sided hydronephrosis without obstructing calculus identified. There is no left-sided hydronephrosis. The  adrenal glands and bladder are within normal limits. The bladder is distended. Stomach/Bowel: The appendix appears normal. There is no free intraperitoneal air. There is diffuse colonic diverticulosis. Percutaneous jejunostomy tube in place. There is some mildly dilated jejunal loop centrally without transition point. Contrast is seen beyond dilated small bowel. Percutaneous gastrostomy tube in place. Stomach is nondilated. Vascular/Lymphatic: No significant vascular findings are present. No enlarged abdominal or pelvic lymph nodes. Reproductive: Status post hysterectomy. No adnexal masses. Other: There is a small amount of free fluid in the abdomen and pelvis. There is some air within the anterior right abdominal wall. There is diffuse body wall edema. Musculoskeletal: Sclerotic osseous metastatic disease and chronic compression fracture of L3 appear unchanged. IMPRESSION: 1. Percutaneous jejunostomy tube in place. There is some mildly dilated jejunal loop centrally without transition point. Findings may represent ileus or partial small bowel obstruction. 2. Small amount of free fluid in the abdomen and pelvis. 3. Small bilateral pleural effusions, right greater than left. 4. Diffuse body wall edema. 5. Mild right-sided hydronephrosis without obstructing calculus identified. 6. Distended bladder. 7. Colonic diverticulosis. 8. Sclerotic osseous metastatic disease. Electronically Signed   By: Greig Pique M.D.   On: 01/15/2024 19:45   DG Abd Portable 1V Result Date: 01/09/2024 CLINICAL DATA:  Vomiting for the past 2 days. EXAM: PORTABLE ABDOMEN - 1 VIEW COMPARISON:  01/08/2024 FINDINGS: Normal bowel-gas pattern. Mild-to-moderate lumbar spine degenerative changes. Postsurgical changes in the left mid and upper abdomen. IMPRESSION: No acute abnormality. Electronically Signed   By: Elspeth Bathe M.D.   On: 01/09/2024 10:18   DG Abd Portable 1V Result Date: 01/08/2024 CLINICAL DATA:  Ileus EXAM: PORTABLE ABDOMEN -  1 VIEW COMPARISON:  Upper GI 01/06/2024.  CT 12/29/2023 FINDINGS: Gas is seen in nondilated loops of large bowel. Scattered stool. Air towards the rectum. Minimal small bowel gas. Nonspecific. No obstruction. No obvious free air on this portable supine radiograph. Of note the diaphragm is clipped off the edge of the film. Degenerative changes along the spine. IMPRESSION: Nonspecific bowel gas pattern.  Overall minimal small bowel gas. Electronically Signed   By: Ranell Bring M.D.   On: 01/08/2024 10:27    Labs:  CBC: Recent Labs    02/01/24 1826 02/02/24 0615 02/02/24 1041 02/03/24 0337 02/04/24 1129  WBC 4.2 4.0  --  4.4 5.4  HGB 8.4* 6.7* 7.9* 8.7* 10.2*  HCT 26.3* 21.6* 25.2* 26.4* 31.7*  PLT 173 146*  --  122* 118*    COAGS: Recent Labs    12/10/23 2004 12/22/23 2233 02/01/24 1826 02/02/24 0615 02/03/24 0337  INR 1.1 1.2 1.2 1.2 1.2  APTT 28  --   --   --   --     BMP: Recent Labs    02/03/24 0337 02/04/24 1129 02/05/24 0727 02/06/24 0435  NA  137 137 136 134*  K 3.6 4.1 4.3 4.2  CL 102 105 108 105  CO2 25 21* 19* 23  GLUCOSE 93 93 88 87  BUN <5* <5* 5* <5*  CALCIUM  7.1* 7.3* 7.7* 8.0*  CREATININE 0.49 0.60 0.54 0.63  GFRNONAA >60 >60 >60 >60    LIVER FUNCTION TESTS: Recent Labs    01/31/24 1147 02/01/24 1826 02/02/24 0615 02/04/24 1129  BILITOT 0.7 0.6 0.8 0.7  AST 43* 46* 74* 55*  ALT 62* 66* 74* 96*  ALKPHOS 642* 540* 518* 603*  PROT 7.3 6.6 5.4* 6.2*  ALBUMIN  3.7 2.9* 2.3* 2.6*    TUMOR MARKERS: No results for input(s): AFPTM, CEA, CA199, CHROMGRNA in the last 8760 hours.  Assessment and Plan:  Request for  image guided port insertion approved tentatively for 8/18. Celiac plexus block initially scheduled OP today will be rescheduled with Dr. Johann as OP procedure after discharge.  No contraindications for procedure identified in ROS, physical exam, or review of pre-sedation considerations. Labs reviewed and within acceptable  range. 8/16 blood cultures neg x 48 hrs.  VSS, afebrile Patient not asked to hold any AC/AP for this low bleeding risk procedure Prophylactic abx not indicated    Risks and benefits of image guided port-a-catheter placement was discussed with the patient including, but not limited to bleeding, infection, pneumothorax, or fibrin sheath development and need for additional procedures.  All of the patient's questions were answered, patient is agreeable to proceed. Consent signed and in chart.   Thank you for allowing our service to participate in BABBIE DONDLINGER 's care.    Electronically Signed: Laymon Coast, NP   02/06/2024, 8:35 AM     I spent a total of 20 Minutes   in face to face in clinical consultation, greater than 50% of which was counseling/coordinating care for image guided port placement for chemo.    (A copy of this note was sent to the referring provider and the time of visit.)

## 2024-02-06 NOTE — Progress Notes (Signed)
 I printed material with photos for pt to understand and feel more familiar with the ostomy pouch as well as provided teaching related to checking residuals via Gtube and importance of not discarding every time/everything that is aspirated.  Also pt has eaten most of meals @100 %. I will put meal tickets on door in bag for calorie count.

## 2024-02-07 ENCOUNTER — Other Ambulatory Visit: Payer: Self-pay | Admitting: Nurse Practitioner

## 2024-02-07 ENCOUNTER — Other Ambulatory Visit (HOSPITAL_COMMUNITY): Payer: Self-pay

## 2024-02-07 ENCOUNTER — Telehealth: Payer: Self-pay | Admitting: *Deleted

## 2024-02-07 MED ORDER — OXYCODONE HCL 15 MG PO TABS
15.0000 mg | ORAL_TABLET | ORAL | 0 refills | Status: DC | PRN
  Filled 2024-02-08: qty 60, 10d supply, fill #0

## 2024-02-07 NOTE — Transitions of Care (Post Inpatient/ED Visit) (Signed)
   02/07/2024  Name: Chelsea Hansen MRN: [id number] DOB: April 08, 1971  Today's TOC FU Call Status: Today's TOC FU Call Status:: Successful TOC FU Call Completed TOC FU Call Complete Date: 02/07/24 Patient's Name and Date of Birth confirmed.  Transition Care Management Follow-up Telephone Call Date of Discharge: 02/06/24 Discharge Facility: [name]) Type of Discharge: Inpatient Admission Primary Inpatient Discharge Diagnosis:: SBO How have you been since you were released from the hospital?: Better Any questions or concerns?: No  Items Reviewed: Did you receive and understand the discharge instructions provided?: Yes Medications obtained,verified, and reconciled?: No Medications Not Reviewed Reasons:: Other: (Patient declined medication review today) Any new allergies since your discharge?: No Dietary orders reviewed?: Yes Type of Diet Ordered:: Heart healthy Do you have support at home?: Yes People in Home [RPT]: parent(s) Name of Support/Comfort Primary Source: Mother and adult child  Medications Reviewed Today: Medications Reviewed Today   Medications were not reviewed in this encounter     Home Care and Equipment/Supplies: Were Home Health Services Ordered?: No Any new equipment or medical supplies ordered?: No  Functional Questionnaire: Do you need assistance with bathing/showering or dressing?: No Do you need assistance with meal preparation?: Yes (daughter assists with meal prep) Do you need assistance with eating?: No Do you have difficulty maintaining continence: No Do you need assistance with getting out of bed/getting out of a chair/moving?: Yes (son assists) Do you have difficulty managing or taking your medications?: No  Follow up appointments reviewed: PCP Follow-up appointment confirmed?: Yes Date of PCP follow-up appointment?: 02/13/24 Follow-up Provider: EMERSON Bohr, NP Specialist Hospital Follow-up appointment confirmed?: Yes Date of Specialist  follow-up appointment?: 02/08/24 Follow-Up Specialty Provider:: [facility] Do you need transportation to your follow-up appointment?: No Do you understand care options if your condition(s) worsen?: Yes-patient verbalized understanding  SDOH Interventions Today    Flowsheet Row Most Recent Value  SDOH Interventions   Food Insecurity Interventions Intervention Not Indicated  Housing Interventions Intervention Not Indicated  Transportation Interventions Intervention Not Indicated  Utilities Interventions Intervention Not Indicated    Goals Addressed             This Visit's Progress    VBCI Transitions of Care (TOC) Care Plan       Problems:  Recent Hospitalization for treatment of SBO Knowledge Deficit Related to ostomy care  Goal:  Over the next 30 days, the patient will not experience hospital readmission  Interventions:  Transitions of Care: Doctor Visits  - discussed the importance of doctor visits Post discharge activity limitations prescribed by provider reviewed Post-op wound/incision care reviewed with patient/caregiver Reviewed Signs and symptoms of infection Reviewed upcoming appointments including:02/08/24 with [facility], 02/10/24 with Ostomy clinic, 02/13/24 with PCP and 02/14/24 Palliative Care  Ensured patient understood the importance of attending upcoming appointments and that transportation was available  Patient Self Care Activities:  Attend all scheduled provider appointments Call provider office for new concerns or questions  Notify RN Care Manager of [facility] call rescheduling needs Participate in Transition of Care Program/Attend [facility] scheduled calls  Plan:  Telephone follow up appointment with care management team member scheduled for:  02/15/24 at 2:15pm        [name] RN, BSN [place]  [facility] RN Care Manager [phone]

## 2024-02-08 ENCOUNTER — Encounter: Payer: Self-pay | Admitting: Hematology and Oncology

## 2024-02-08 ENCOUNTER — Encounter: Payer: Self-pay | Admitting: *Deleted

## 2024-02-08 ENCOUNTER — Other Ambulatory Visit: Payer: Self-pay

## 2024-02-08 ENCOUNTER — Other Ambulatory Visit (HOSPITAL_COMMUNITY): Payer: Self-pay

## 2024-02-08 ENCOUNTER — Ambulatory Visit: Payer: MEDICAID | Admitting: Nutrition

## 2024-02-08 ENCOUNTER — Encounter: Payer: Self-pay | Admitting: Nutrition

## 2024-02-08 ENCOUNTER — Inpatient Hospital Stay (HOSPITAL_BASED_OUTPATIENT_CLINIC_OR_DEPARTMENT_OTHER): Payer: MEDICAID | Admitting: Hematology and Oncology

## 2024-02-08 ENCOUNTER — Inpatient Hospital Stay: Payer: MEDICAID

## 2024-02-08 VITALS — BP 95/61 | HR 97 | Temp 98.1°F | Resp 18 | Ht 71.0 in | Wt 176.1 lb

## 2024-02-08 DIAGNOSIS — Z79811 Long term (current) use of aromatase inhibitors: Secondary | ICD-10-CM | POA: Diagnosis not present

## 2024-02-08 DIAGNOSIS — C7951 Secondary malignant neoplasm of bone: Secondary | ICD-10-CM | POA: Diagnosis not present

## 2024-02-08 DIAGNOSIS — I959 Hypotension, unspecified: Secondary | ICD-10-CM | POA: Diagnosis not present

## 2024-02-08 DIAGNOSIS — Z170 Estrogen receptor positive status [ER+]: Secondary | ICD-10-CM | POA: Diagnosis not present

## 2024-02-08 DIAGNOSIS — C50411 Malignant neoplasm of upper-outer quadrant of right female breast: Secondary | ICD-10-CM | POA: Diagnosis not present

## 2024-02-08 DIAGNOSIS — K869 Disease of pancreas, unspecified: Secondary | ICD-10-CM | POA: Diagnosis not present

## 2024-02-08 DIAGNOSIS — D6481 Anemia due to antineoplastic chemotherapy: Secondary | ICD-10-CM | POA: Diagnosis not present

## 2024-02-08 DIAGNOSIS — Z515 Encounter for palliative care: Secondary | ICD-10-CM | POA: Diagnosis not present

## 2024-02-08 DIAGNOSIS — K5903 Drug induced constipation: Secondary | ICD-10-CM | POA: Diagnosis not present

## 2024-02-08 DIAGNOSIS — E876 Hypokalemia: Secondary | ICD-10-CM | POA: Diagnosis not present

## 2024-02-08 DIAGNOSIS — Z5112 Encounter for antineoplastic immunotherapy: Secondary | ICD-10-CM | POA: Diagnosis present

## 2024-02-08 DIAGNOSIS — G893 Neoplasm related pain (acute) (chronic): Secondary | ICD-10-CM | POA: Diagnosis not present

## 2024-02-08 DIAGNOSIS — C50412 Malignant neoplasm of upper-outer quadrant of left female breast: Secondary | ICD-10-CM | POA: Diagnosis present

## 2024-02-08 DIAGNOSIS — E878 Other disorders of electrolyte and fluid balance, not elsewhere classified: Secondary | ICD-10-CM | POA: Diagnosis not present

## 2024-02-08 DIAGNOSIS — E869 Volume depletion, unspecified: Secondary | ICD-10-CM | POA: Diagnosis not present

## 2024-02-08 LAB — CMP (CANCER CENTER ONLY)
ALT: 56 U/L — ABNORMAL HIGH (ref 0–44)
AST: 47 U/L — ABNORMAL HIGH (ref 15–41)
Albumin: 3.2 g/dL — ABNORMAL LOW (ref 3.5–5.0)
Alkaline Phosphatase: 832 U/L — ABNORMAL HIGH (ref 38–126)
Anion gap: 8 (ref 5–15)
BUN: 6 mg/dL (ref 6–20)
CO2: 23 mmol/L (ref 22–32)
Calcium: 7.8 mg/dL — ABNORMAL LOW (ref 8.9–10.3)
Chloride: 106 mmol/L (ref 98–111)
Creatinine: 0.43 mg/dL — ABNORMAL LOW (ref 0.44–1.00)
GFR, Estimated: >60 mL/min (ref >60)
Glucose, Bld: 102 mg/dL — ABNORMAL HIGH (ref 70–99)
Potassium: 3.7 mmol/L (ref 3.5–5.1)
Sodium: 137 mmol/L (ref 135–145)
Total Bilirubin: 0.4 mg/dL (ref 0.0–1.2)
Total Protein: 6 g/dL — ABNORMAL LOW (ref 6.5–8.1)

## 2024-02-08 LAB — CBC WITH DIFFERENTIAL (CANCER CENTER ONLY)
Abs Immature Granulocytes: 0.32 K/uL — ABNORMAL HIGH (ref 0.00–0.07)
Basophils Absolute: 0 K/uL (ref 0.0–0.1)
Basophils Relative: 1 %
Eosinophils Absolute: 0 K/uL (ref 0.0–0.5)
Eosinophils Relative: 0 %
HCT: 27.4 % — ABNORMAL LOW (ref 36.0–46.0)
Hemoglobin: 9.1 g/dL — ABNORMAL LOW (ref 12.0–15.0)
Immature Granulocytes: 7 %
Lymphocytes Relative: 27 %
Lymphs Abs: 1.3 K/uL (ref 0.7–4.0)
MCH: 25.6 pg — ABNORMAL LOW (ref 26.0–34.0)
MCHC: 33.2 g/dL (ref 30.0–36.0)
MCV: 77.2 fL — ABNORMAL LOW (ref 80.0–100.0)
Monocytes Absolute: 0.4 K/uL (ref 0.1–1.0)
Monocytes Relative: 8 %
Neutro Abs: 2.8 K/uL (ref 1.7–7.7)
Neutrophils Relative %: 57 %
Platelet Count: 118 K/uL — ABNORMAL LOW (ref 150–400)
RBC: 3.55 MIL/uL — ABNORMAL LOW (ref 3.87–5.11)
RDW: 21.2 % — ABNORMAL HIGH (ref 11.5–15.5)
WBC Count: 4.9 K/uL (ref 4.0–10.5)
nRBC: 0 % (ref 0.0–0.2)

## 2024-02-08 MED ORDER — LIDOCAINE-PRILOCAINE 2.5-2.5 % EX CREA
TOPICAL_CREAM | CUTANEOUS | 3 refills | Status: DC
  Filled 2024-02-08: qty 30, 30d supply, fill #0

## 2024-02-08 MED ORDER — PROCHLORPERAZINE MALEATE 10 MG PO TABS
10.0000 mg | ORAL_TABLET | Freq: Four times a day (QID) | ORAL | 1 refills | Status: DC | PRN
  Filled 2024-02-08: qty 30, 8d supply, fill #0

## 2024-02-08 MED ORDER — DEXAMETHASONE 4 MG PO TABS
ORAL_TABLET | ORAL | 1 refills | Status: DC
  Filled 2024-02-08: qty 30, 30d supply, fill #0

## 2024-02-08 MED ORDER — ONDANSETRON HCL 8 MG PO TABS
8.0000 mg | ORAL_TABLET | Freq: Three times a day (TID) | ORAL | 1 refills | Status: DC | PRN
Start: 2024-02-08 — End: 2024-04-17
  Filled 2024-02-08: qty 30, 10d supply, fill #0

## 2024-02-08 NOTE — Progress Notes (Signed)
 Patient Care Team: [name], NP as PCP - General (Internal Medicine) [name], DO as PCP - Cardiology (Cardiology) [name], MD as Consulting Physician (Radiation Oncology) [name], MD as Consulting Physician (General Surgery) [name], MD as Consulting Physician (Hematology and Oncology) [name], [name], NP as Nurse Practitioner (Nurse Practitioner) [name], NP as Nurse Practitioner (Hematology and Oncology) [name], RN as [facility] Care Management  DIAGNOSIS:  Encounter Diagnosis  Name Primary?   Malignant neoplasm of upper-outer quadrant of right breast in female, estrogen receptor positive (HCC) Yes    SUMMARY OF ONCOLOGIC HISTORY: Oncology History  Malignant neoplasm of upper-outer quadrant of right breast in female, estrogen receptor positive (HCC)  12/12/2020 Initial Diagnosis   status post bilateral breast biopsies 12/03/2020, showing             (1) on the right, a clinical T2 N1, stage IIa invasive lobular carcinoma, grade 2, estrogen and progesterone receptor strongly positive, HER2 not amplified, with an MIB-1 of 40%                         (a) the biopsied right axillary lymph node was positive with extracapsular extension                         (b) a second right breast mass also biopsied was a fibroadenoma, concordant  MAMMAPRINT tested on biopsy returned high risk, luminal type B, indicating significant benefit from chemo                         (c) biopsy of an area of non-mass-like enhancement in the upper right breast pending   12/17/2020 Cancer Staging   Staging form: Breast, AJCC 8th Edition - Clinical stage from 12/17/2020: Stage IIA (cT2, cN1, cM0, G2, ER+, PR+, HER2-) - Signed by [name], NP on 01/14/2022 Stage prefix: Initial diagnosis Histologic grading system: 3 grade system Laterality: Right Staged by: Pathologist and managing physician Stage used in treatment planning:  Yes National guidelines used in treatment planning: Yes Type of national guideline used in treatment planning: NCCN   12/24/2020 Genetic Testing   Negative genetic testing:  No pathogenic variants detected on the [employer] BRCAplus panel (report date 12/24/2020) or the CancerNext-Expanded + RNAinsight panel (report date 12/31/2020). 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   The BRCAplus panel offered by [employer] and includes sequencing and deletion/duplication analysis for the following 8 genes: ATM, BRCA1, BRCA2, CDH1, CHEK2, PALB2, PTEN, and TP53. The CancerNext-Expanded + RNAinsight gene panel offered by [employer] and includes sequencing and rearrangement analysis for the following 77 genes: AIP, ALK, APC, ATM, AXIN2, BAP1, BARD1, BLM, BMPR1A, BRCA1, BRCA2, BRIP1, CDC73, CDH1, CDK4, CDKN1B, CDKN2A, CHEK2, CTNNA1, DICER1, FANCC, FH, FLCN, GALNT12, KIF1B, LZTR1, MAX, MEN1, MET, MLH1, MSH2, MSH3, MSH6, MUTYH, NBN, NF1, NF2, NTHL1, PALB2, PHOX2B, PMS2, POT1, PRKAR1A, PTCH1, PTEN, RAD51C, RAD51D, RB1, RECQL, RET, SDHA, SDHAF2, SDHB, SDHC, SDHD, SMAD4, SMARCA4, SMARCB1, SMARCE1, STK11, SUFU, TMEM127, TP53, TSC1, TSC2, VHL and XRCC2 (sequencing and deletion/duplication); EGFR, EGLN1, HOXB13, KIT, MITF, PDGFRA, POLD1 and POLE (sequencing only); EPCAM and GREM1 (deletion/duplication only). RNA data is routinely analyzed for use in variant interpretation for all genes.   01/27/2021 -  Neo-Adjuvant Chemotherapy   Neoadjuvant chemotherapy with Doxorubicin  and Cyclophosphamide  given x 4 beginning 01/27/2021 and completing on  03/10/2021 followed by weekly paclitaxel  x 12 beginning 03/24/2021   07/09/2021 Surgery   Right lumpectomy: Grade 2 invasive lobular cancer, 2.5 cm, LCIS, margins negative, LCIS focally at anterior margin, 1/1 lymph node positive, ER 95%, PR 100%, HER2 negative, Ki-67 40% Left lumpectomy: High-grade DCIS: 0.7 cm, margins negative, 0/1 lymph  node negative ER 95%, PR 100%   07/09/2021 Cancer Staging   Staging form: Breast, AJCC 8th Edition - Pathologic stage from 07/09/2021: No Stage Recommended (ypT2, pN1a, cM0, G2) - Signed by [name], NP on 07/22/2021 Stage prefix: Post-therapy Histologic grading system: 3 grade system   07/2021 -  Radiation Therapy   Adjuvant radiation to follow surgery   11/2021 -  Anti-estrogen oral therapy   Letrozole  x 5-7 years; changed to anastrozole    05/13/2022 Imaging   CT chest  IMPRESSION: 1. Multifocal sclerotic bony metastatic disease greatest in the thoracic spine with scattered rib lesions and sclerosis as well. 2. No nodal or visceral metastasis about the chest. 3. Collateral pathways about the chest suggest some subclavian venous narrowing on the LEFT. This could also be related to arm position. Correlate clinically. 4. Signs of RIGHT breast lumpectomy. Potential postoperative changes in the LEFT breast with small focal area of nodularity in the lateral LEFT breast which could also be postoperative, correlate clinically and consider mammographic correlation as warranted. 5. Mild cardiac enlargement.   05/18/2022 Treatment Plan Change   Anastrozole  + Verzenio  100mg  PO BID   12/07/2022 Cancer Staging   Staging form: Breast, AJCC 8th Edition - Pathologic: Stage IV (cM1) - Signed by [name], NP on 12/07/2022   Breast cancer (HCC)  12/12/2020 Initial Diagnosis   on the left, a clinical T1b N0, stage IA invasive ductal carcinoma, grade 1 or 2, estrogen and progesterone receptor positive, HER2 not amplified, with an MIB 1 of 15%   12/17/2020 Cancer Staging   Staging form: Breast, AJCC 8th Edition - Clinical stage from 12/17/2020: Stage IA (cT1b, cN0, cM0, G2, ER+, PR+, HER2-) - Signed by [name], NP on 01/14/2022 Stage prefix: Initial diagnosis Histologic grading system: 3 grade system Laterality: Left Staged by: Pathologist and managing  physician Stage used in treatment planning: Yes National guidelines used in treatment planning: Yes Type of national guideline used in treatment planning: NCCN   12/24/2020 Genetic Testing   Negative genetic testing:  No pathogenic variants detected on the [employer] BRCAplus panel (report date 12/24/2020) or the CancerNext-Expanded + RNAinsight panel (report date 12/31/2020). 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   The BRCAplus panel offered by [employer] and includes sequencing and deletion/duplication analysis for the following 8 genes: ATM, BRCA1, BRCA2, CDH1, CHEK2, PALB2, PTEN, and TP53. The CancerNext-Expanded + RNAinsight gene panel offered by [employer] and includes sequencing and rearrangement analysis for the following 77 genes: AIP, ALK, APC, ATM, AXIN2, BAP1, BARD1, BLM, BMPR1A, BRCA1, BRCA2, BRIP1, CDC73, CDH1, CDK4, CDKN1B, CDKN2A, CHEK2, CTNNA1, DICER1, FANCC, FH, FLCN, GALNT12, KIF1B, LZTR1, MAX, MEN1, MET, MLH1, MSH2, MSH3, MSH6, MUTYH, NBN, NF1, NF2, NTHL1, PALB2, PHOX2B, PMS2, POT1, PRKAR1A, PTCH1, PTEN, RAD51C, RAD51D, RB1, RECQL, RET, SDHA, SDHAF2, SDHB, SDHC, SDHD, SMAD4, SMARCA4, SMARCB1, SMARCE1, STK11, SUFU, TMEM127, TP53, TSC1, TSC2, VHL and XRCC2 (sequencing and deletion/duplication); EGFR, EGLN1, HOXB13, KIT, MITF, PDGFRA, POLD1 and POLE (sequencing only); EPCAM and GREM1 (deletion/duplication only). RNA data is routinely analyzed for use in variant interpretation for all genes.   01/27/2021 -  Neo-Adjuvant  Chemotherapy   Neoadjuvant chemotherapy with Doxorubicin  and Cyclophosphamide  given x 4 beginning 01/27/2021 and completing on 03/10/2021 followed by weekly paclitaxel  x 12 beginning 03/24/2021   07/09/2021 Surgery   Right lumpectomy: Grade 2 invasive lobular cancer, 2.5 cm, LCIS, margins negative, LCIS focally at anterior margin, 1/1 lymph node positive, ER 95%, PR 100%, HER2 negative, Ki-67 40% Left lumpectomy:  High-grade DCIS: 0.7 cm, margins negative, 0/1 lymph node negative ER 95%, PR 100%   07/09/2021 Cancer Staging   Staging form: Breast, AJCC 8th Edition - Pathologic stage from 07/09/2021: No Stage Recommended (ypTis (DCIS), pN0, cM0) - Signed by [name], NP on 07/22/2021 Stage prefix: Post-therapy   07/2021 -  Radiation Therapy   Adjuvant radiation to follow surgery   11/2021 -  Anti-estrogen oral therapy   Letrozole  x 5-7 years; changed to anastrozole    05/13/2022 Imaging   CT chest  IMPRESSION: 1. Multifocal sclerotic bony metastatic disease greatest in the thoracic spine with scattered rib lesions and sclerosis as well. 2. No nodal or visceral metastasis about the chest. 3. Collateral pathways about the chest suggest some subclavian venous narrowing on the LEFT. This could also be related to arm position. Correlate clinically. 4. Signs of RIGHT breast lumpectomy. Potential postoperative changes in the LEFT breast with small focal area of nodularity in the lateral LEFT breast which could also be postoperative, correlate clinically and consider mammographic correlation as warranted. 5. Mild cardiac enlargement.   05/18/2022 Treatment Plan Change   Anastrozole  + Verzenio  100mg  PO BID   06/23/2022 - 07/07/2022 Radiation Therapy   06/23/2022 through 07/07/2022 Site Technique Total Dose (Gy) Dose per Fx (Gy) Completed Fx Beam Energies  Lumbar Spine: Spine_LS_Pelv 3D 30/30 3 10/10 15X       CHIEF COMPLIANT: Follow-up after recent hospitalization to discuss the treatment plan  HISTORY OF PRESENT ILLNESS: History of Present Illness Chelsea Hansen is a 53 year old female with metastatic breast cancer who presents for follow-up regarding her cancer treatment.  She has metastatic breast cancer with abdominal involvement, characterized as estrogen receptor positive and HER2 low positive. Previous treatment with Verzenio  resulted in cancer progression. A port is placed below her  right collarbone for treatment administration. Patient is feeling remarkably better with complete improvement in the nausea vomiting and abdominal pain.  She is now able to eat a lot of food and able to move her bowels.  She has not required any of the J-tube feeds.  Recent blood work indicates anemia with a hemoglobin level of 9.1, normal white blood cell count, and a platelet count of approximately 100. Potassium is normal at 3.7, while calcium  is low, managed with supplements. Liver function tests are slightly abnormal, but albumin  has improved from 2.3 to 3.2.  She is experiencing weight gain and is focusing on improving her nutrition, which has contributed to the improvement in albumin  levels.     ALLERGIES:  has no known allergies.  MEDICATIONS:  Current Outpatient Medications  Medication Sig Dispense Refill   fentaNYL  (DURAGESIC ) 12 MCG/HR Place 1 patch onto the skin every 3 (three) days. 5 patch 0   Multiple Vitamin (MULTIVITAMIN WITH MINERALS) TABS tablet Take 1 tablet by mouth daily.     oxyCODONE  (ROXICODONE ) 15 MG immediate release tablet Take 1 tablet (15 mg total) by mouth every 4 (four) hours as needed for pain. 60 tablet 0   calcitRIOL  (ROCALTROL ) 0.5 MCG capsule Take 1 capsule (0.5 mcg total) by mouth daily. (Patient not taking:  Reported on 02/08/2024) 30 capsule 0   Calcium  Carb-Cholecalciferol  600-10 MG-MCG TABS Take 1 tablet by mouth 3 (three) times daily. (Patient not taking: Reported on 02/08/2024) 90 tablet 0   gabapentin  (NEURONTIN ) 300 MG capsule Take 1 capsule (300 mg total) by mouth 3 (three) times daily. (Patient not taking: Reported on 02/08/2024) 90 capsule 6   pantoprazole  (PROTONIX ) 40 MG tablet Take 1 tablet (40 mg total) by mouth 2 (two) times daily. (Patient not taking: Reported on 02/08/2024) 60 tablet 0   polyethylene glycol powder (GLYCOLAX /MIRALAX ) 17 GM/SCOOP powder Mix 17 g in 4 oz of liquid and take by mouth 2 (two) times daily as needed. (Patient not  taking: Reported on 02/08/2024) 3350 g 1   prochlorperazine  (COMPAZINE ) 10 MG tablet Take 1 tablet (10 mg total) by mouth every 6 (six) hours as needed for nausea or vomiting. (Patient not taking: Reported on 02/08/2024) 30 tablet 1   senna (SENOKOT) 8.6 MG TABS tablet Take 2 tablets (17.2 mg total) by mouth 2 (two) times daily. (Patient not taking: Reported on 02/08/2024) 90 tablet 3   thiamine  (VITAMIN B1) 100 MG tablet Take 1 tablet (100 mg total) by mouth daily. (Patient not taking: Reported on 02/08/2024) 30 tablet 0   No current facility-administered medications for this visit.    PHYSICAL EXAMINATION: ECOG PERFORMANCE STATUS: 1 - Symptomatic but completely ambulatory  Vitals:   02/08/24 1125  BP: 95/61  Pulse: 97  Resp: 18  Temp: 98.1 F (36.7 C)  SpO2: 98%   Filed Weights   02/08/24 1125  Weight: 176 lb 1.6 oz (79.9 kg)    Physical Exam    (exam performed in the presence of a chaperone)  LABORATORY DATA:  I have reviewed the data as listed    Latest Ref Rng & Units 02/08/2024   11:13 AM 02/06/2024    4:35 AM 02/05/2024    7:27 AM  CMP  Glucose 70 - 99 mg/dL 897  87  88   BUN 6 - 20 mg/dL 6  <5  5   Creatinine 9.55 - 1.00 mg/dL 9.56  9.36  9.45   Sodium 135 - 145 mmol/L 137  134  136   Potassium 3.5 - 5.1 mmol/L 3.7  4.2  4.3   Chloride 98 - 111 mmol/L 106  105  108   CO2 22 - 32 mmol/L 23  23  19    Calcium  8.9 - 10.3 mg/dL 7.8  8.0  7.7   Total Protein 6.5 - 8.1 g/dL 6.0     Total Bilirubin 0.0 - 1.2 mg/dL 0.4     Alkaline Phos 38 - 126 U/L 832     AST 15 - 41 U/L 47     ALT 0 - 44 U/L 56       Lab Results  Component Value Date   WBC 4.9 02/08/2024   HGB 9.1 (L) 02/08/2024   HCT 27.4 (L) 02/08/2024   MCV 77.2 (L) 02/08/2024   PLT 118 (L) 02/08/2024   NEUTROABS 2.8 02/08/2024    ASSESSMENT & PLAN:  Malignant neoplasm of upper-outer quadrant of right breast in female, estrogen receptor positive (HCC) 12/03/2020: Bilateral breast biopsies: Right breast:  T2N1 stage IIa grade 2 ILC, ER/PR positive, HER2 negative, Ki-67 40%, right axillary lymph node positive with extracapsular extension MammaPrint: High risk   01/27/2021 -06/08/2021 neoadjuvant chemotherapy with dose dense Adriamycin  and Cytoxan  x4 followed by Taxol  x11   07/09/2021:Right lumpectomy: Grade 2 invasive lobular cancer, 2.5  cm, LCIS, margins negative, LCIS focally at anterior margin, 1/1 lymph node positive, ER 95%, PR 100%, HER2 negative, Ki-67 40% 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  Hospitalization: 12/22/2023-01/17/2024: Intractable nausea vomiting (PEG tube: Venting gastrostomy, J-tube) Hospitalization 02/01/2024-02/06/2024: Intractable pain, nausea vomiting ---------------------------------------------------------------------------------------------------------------------- 06/10/2023: PET/CT: Interval development of hypermetabolic lymphadenopathy left submandibular region, throughout mediastinal, bilateral hilar, upper abdomen (no uptake in the bones)  02/02/2024: CT abdomen and pelvis: Pancreatic mass, right hydronephrosis, peritoneal carcinomatosis, bone metastases L3 Peritoneal biopsy 02/03/2024: Metastatic breast cancer, ER 100%, PR 0%, HER2 2+  Treatment plan: Enhertu every 3 weeks to start in 1 week. Chemo counseling: Discussed with her the risks and benefits of Enhertu. She is agreeable and willing to proceed.  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 Enhertu recommended for targeting HER2 receptors, more effective than Verzenio  alone. Side effects include nausea, hair thinning, anemia, rare  interstitial lung disease. Liver function monitoring due to drug metabolism. Initial lower dose to assess tolerance. - Administer Enhertu infusion every three weeks. - Monitor for side effects including nausea, hair thinning, anemia, and interstitial lung disease. - Conduct CT scans to monitor for interstitial lung disease. - Monitor liver function tests. - Schedule first treatment for next Wednesday. - Follow up one week after first treatment to assess tolerance.  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 Current levels do not require transfusion. - Monitor hemoglobin levels. - Administer blood transfusion if necessary.  Nutritional deficiency and hypocalcemia, improved Nutritional status improved, albumin  increased from 2.3 to 3.2. Hypocalcemia present, calcium  supplementation recommended. - Continue calcium  supplementation. - Monitor nutritional status and albumin  levels.  Electrolyte imbalance, improved Electrolyte levels improved, potassium normal at 3.7. - Continue monitoring electrolyte levels.  Management of vascular access port and gastrostomy tube Vascular access port below right collarbone, managed in clinic. Gastrostomy tube management ongoing, external services not required for port. - Manage vascular access port in clinic. - Allow external services for gastrostomy tube management.      No orders of the defined types were placed in this encounter.  The patient has a good understanding of the overall plan. she agrees with it. she will call with any problems that may develop before the next visit here. Total time spent: 45 mins including face to face time and time spent for planning, charting and co-ordination of care   Naomi MARLA Chad, MD 02/08/24

## 2024-02-08 NOTE — Progress Notes (Signed)
 Our office received message from [place] at [facility] in reference to referral that was sent last week. She states that Dr. Elsie Cree said that the pts case is too complicated and he recommends that she goes to a tertiary center. RN reviewed with MD who states that pt does not need to be seen by GI at this time.  If pt experiences issues with G-Tube, she will need to f/u with surgeon.

## 2024-02-08 NOTE — Progress Notes (Addendum)
 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  She has had multiple hospital admissions due to intractable pain, severe electrolyte abnormalities and malnutrition.  She is followed by Dr. [name] who has been treating her for metastatic breast cancer.  I met with patient briefly in exam room today.  She requested a food bag so I wanted to discuss foods that she is tolerating.  Reports she can eat anything now.  She still has a G-tube but is not using it for nutrition support.  She no longer has a J-tube.  She has an ostomy pouch at J-tube site. She is not getting TPN. Her G-tube continues to leak and was provided with daily wound care as follows:  02/04/24 2000                Wound care  Daily      Comments: Cleanse around G tube with NS, apply silver  hydrofiber Soila [phone] Aquacel AG) to red moist areas daily (may cut Aquacel like a split gauze and use around G tube).  If skin is irritated from drainage ok to  apply Desitin 2 times daily as needed.     If G tube is draining and not contained by silver  would try Drawtex superabsorbent dressing cut like a split gauze Gerlean #[id number]             02/04/24 1839                02/03/24 1914                    Weight: 176 pounds 1.6 ounces August 20 167 pounds 12.8 ounces August 12 182 pounds 0.2 ounces on July 20 179 pounds 7.3 ounces June 21  Labs include glucose 102, creatinine 0.43, albumin  3.2, hemoglobin 9.1  Medications include multivitamin, fentanyl  and oxycodone .  Currently denies nausea, vomiting, constipation, or diarrhea.  Patient reports she is not using her G-tube at all.  She sets an alarm so she remembers to flush her tube.  She is requesting assistance with food and toiletries today.  I briefly reviewed importance of cleaning around her G-tube daily.  Reports tube is still leaking so she has several layers of gauze around it.  I did not observe her G-tube site.  I assisted patient  with flushing her G-tube today.  Patient demonstrated understanding.  Have provided patient with food, toiletries, and a sample of oral nutrition supplements.  I have also given her a supply of gauze, tape, syringes, and panties for her G-tube.  I will follow for plan of care.  Will evaluate need for full nutrition assessment after plan of care developed.  Patient has my contact information for questions or concerns or further nutrition needs.  **Disclaimer: This note was dictated with voice recognition software. Similar sounding words can inadvertently be transcribed and this note may contain transcription errors which may not have been corrected upon publication of note.**

## 2024-02-08 NOTE — Progress Notes (Signed)
 DISCONTINUE ON PATHWAY REGIMEN - Breast     Cycles 1 through 4: A cycle is every 14 days:     Doxorubicin       Cyclophosphamide       Pegfilgrastim -xxxx    Cycles 5 through 16: A cycle is every 7 days:     Paclitaxel    **Always confirm dose/schedule in your pharmacy ordering system**  PRIOR TREATMENT: [id number]: Dose-Dense AC-T (Paclitaxel  Weekly) - [Doxorubicin  + Cyclophosphamide  q14 Days x 4 Cycles, Followed by Paclitaxel  80 mg/m2 Weekly x 12 Weeks]  START ON PATHWAY REGIMEN - Breast     A cycle is every 21 days:     Fam-trastuzumab deruxtecan-nxki   **Always confirm dose/schedule in your pharmacy ordering system**  Patient Characteristics: Distant Metastases or Locoregional Recurrent Disease - Unresected, M0 or Locally Advanced Unresectable Disease Progressing after Neoadjuvant and Local Therapies, M0, HER2 Negative/Ultralow/Low, ER Positive, Chemotherapy, HER2 Ultralow/Low, Second Line,  gBRCA Wildtype or Not a Candidate for Molecular Targeted Therapy Therapeutic Status: Distant Metastases HER2 Status: Low ER Status: Positive (+) PR Status: Negative (-) Therapy Approach Indicated: Standard Chemotherapy/Endocrine Therapy Line of Therapy: Second Line Intent of Therapy: Non-Curative / Palliative Intent, Discussed with Patient

## 2024-02-08 NOTE — Assessment & Plan Note (Addendum)
 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  Hospitalization: 12/22/2023-01/17/2024: Intractable nausea vomiting (PEG tube: Venting gastrostomy, J-tube) Hospitalization 02/01/2024-02/06/2024: Intractable pain, nausea vomiting ---------------------------------------------------------------------------------------------------------------------- 06/10/2023: PET/CT: Interval development of hypermetabolic lymphadenopathy left submandibular region, throughout mediastinal, bilateral hilar, upper abdomen (no uptake in the bones)  02/02/2024: CT abdomen and pelvis: Pancreatic mass, right hydronephrosis, peritoneal carcinomatosis, bone metastases L3 Peritoneal biopsy 02/03/2024: Metastatic breast cancer, prognostic panel pending  Treatment plan: Patient is interested to proceed with chemotherapy. If the final tumor is HER2 low: I would recommend Enhertu If the HER2 is 0 then I would recommend Taxol 

## 2024-02-09 ENCOUNTER — Other Ambulatory Visit (HOSPITAL_COMMUNITY): Payer: Self-pay

## 2024-02-09 LAB — CALCIUM, IONIZED: Calcium, Ionized, Serum: 4.5 mg/dL (ref 4.5–5.6)

## 2024-02-09 LAB — CULTURE, BLOOD (ROUTINE X 2)
Culture: NO GROWTH
Culture: NO GROWTH

## 2024-02-10 ENCOUNTER — Ambulatory Visit (HOSPITAL_COMMUNITY)
Admission: RE | Admit: 2024-02-10 | Discharge: 2024-02-10 | Disposition: A | Discharge status: Home / Self Care | Payer: MEDICAID | Source: Ambulatory Visit | Attending: Surgery | Admitting: Surgery

## 2024-02-10 DIAGNOSIS — Z931 Gastrostomy status: Secondary | ICD-10-CM | POA: Insufficient documentation

## 2024-02-10 DIAGNOSIS — Z431 Encounter for attention to gastrostomy: Secondary | ICD-10-CM | POA: Insufficient documentation

## 2024-02-10 DIAGNOSIS — L24B1 Irritant contact dermatitis related to digestive stoma or fistula: Secondary | ICD-10-CM | POA: Diagnosis not present

## 2024-02-10 DIAGNOSIS — K9423 Gastrostomy malfunction: Secondary | ICD-10-CM | POA: Insufficient documentation

## 2024-02-10 NOTE — Progress Notes (Signed)
 [facility]   Reason for visit:  LMQ feeding tube site, continues to produce effluent.  Peristomal irritation HPI:  Feeding tube removed and new tube placed. Past Medical History:  Diagnosis Date   Allergy    seasonal allergies   Anxiety    on meds   Asthma    uses inhaler   Breast cancer (HCC) 2012   RIGHT lumpectomy   Cancer (HCC) 2022   RIGHT breast lump-dx 2022   Depression    on meds   DVT (deep venous thrombosis) (HCC) 2010   after hysterectomy   Family history of uterine cancer    GERD (gastroesophageal reflux disease)    with certain foods/OTC PRN meds   Headache(784.0)    History of radiation therapy    Bilateral breast- 09/03/21-11/02/21- Dr. Lynwood Nasuti   Hyperlipidemia    on meds   Hypertension    on meds   SVT (supraventricular tachycardia) (HCC)    Family History  Problem Relation Age of Onset   Breast cancer Mother        23s   Hypertension Father    Uterine cancer Maternal Aunt    Ovarian cancer Maternal Aunt    Breast cancer Maternal Aunt        73s   Breast cancer Cousin 8   Uterine cancer Cousin 50   Colon polyps Neg Hx    Colon cancer Neg Hx    Esophageal cancer Neg Hx    Stomach cancer Neg Hx    No Known Allergies Current Outpatient Medications  Medication Sig Dispense Refill Last Dose/Taking   calcitRIOL  (ROCALTROL ) 0.5 MCG capsule Take 1 capsule (0.5 mcg total) by mouth daily. (Patient not taking: Reported on 02/08/2024) 30 capsule 0    Calcium  Carb-Cholecalciferol  600-10 MG-MCG TABS Take 1 tablet by mouth 3 (three) times daily. (Patient not taking: Reported on 02/08/2024) 90 tablet 0    dexamethasone  (DECADRON ) 4 MG tablet Take 2 tablets (8 mg) by mouth daily for 3 days starting the day after chemotherapy. Take with food. 30 tablet 1    fentaNYL  (DURAGESIC ) 12 MCG/HR Place 1 patch onto the skin every 3 (three) days. 5 patch 0    gabapentin  (NEURONTIN ) 300 MG capsule Take 1 capsule (300 mg total) by mouth 3 (three) times  daily. (Patient not taking: Reported on 02/08/2024) 90 capsule 6    lidocaine -prilocaine  (EMLA ) cream Apply to affected area once 30 g 3    Multiple Vitamin (MULTIVITAMIN WITH MINERALS) TABS tablet Take 1 tablet by mouth daily.      ondansetron  (ZOFRAN ) 8 MG tablet Take 1 tablet (8 mg total) by mouth every 8 (eight) hours as needed for nausea or vomiting. Start on the third day after chemotherapy. 30 tablet 1    oxyCODONE  (ROXICODONE ) 15 MG immediate release tablet Take 1 tablet (15 mg total) by mouth every 4 (four) hours as needed for pain. 60 tablet 0    pantoprazole  (PROTONIX ) 40 MG tablet Take 1 tablet (40 mg total) by mouth 2 (two) times daily. (Patient not taking: Reported on 02/08/2024) 60 tablet 0    polyethylene glycol powder (GLYCOLAX /MIRALAX ) 17 GM/SCOOP powder Mix 17 g in 4 oz of liquid and take by mouth 2 (two) times daily as needed. (Patient not taking: Reported on 02/08/2024) 3350 g 1    prochlorperazine  (COMPAZINE ) 10 MG tablet Take 1 tablet (10 mg total) by mouth every 6 (six) hours as needed for nausea or vomiting. (Patient not taking: Reported  on 02/08/2024) 30 tablet 1    prochlorperazine  (COMPAZINE ) 10 MG tablet Take 1 tablet (10 mg total) by mouth every 6 (six) hours as needed for nausea or vomiting. 30 tablet 1    senna (SENOKOT) 8.6 MG TABS tablet Take 2 tablets (17.2 mg total) by mouth 2 (two) times daily. (Patient not taking: Reported on 02/08/2024) 90 tablet 3    thiamine  (VITAMIN B1) 100 MG tablet Take 1 tablet (100 mg total) by mouth daily. (Patient not taking: Reported on 02/08/2024) 30 tablet 0    No current facility-administered medications for this encounter.   ROS  Review of Systems  Constitutional:  Positive for fatigue.  Respiratory: Negative.    Cardiovascular: Negative.   Gastrointestinal:  Positive for nausea.       Upper abdomen G tube in place Former G tube site in LLQ continues to leak and irritate skin   Skin:  Positive for color change and rash.   Psychiatric/Behavioral:  Positive for dysphoric mood.   All other systems reviewed and are negative.  Vital signs:  BP 121/78 (BP Location: Right Arm)   Pulse 96   Temp 98.8 F (37.1 C) (Oral)   Resp 18   SpO2 100%  Exam:  Physical Exam Vitals reviewed.  Constitutional:      Appearance: Normal appearance.  HENT:     Mouth/Throat:     Mouth: Mucous membranes are moist.  Cardiovascular:     Rate and Rhythm: Normal rate.  Pulmonary:     Effort: Pulmonary effort is normal.  Abdominal:     Palpations: Abdomen is soft.  Musculoskeletal:        General: Normal range of motion.  Skin:    General: Skin is warm and dry.     Findings: Erythema present.  Neurological:     Mental Status: She is alert and oriented to person, place, and time. Mental status is at baseline.  Psychiatric:        Behavior: Behavior normal.     Stoma type/location:  LLQ former feeding tube site Currently has new gastrostomy tube in center of upper abdomen Stomal assessment/size:  former tube site is 7/8  and flush with skin today.  Peristomal assessment:  Denuded skin, extends down abdomen from exposure to effluent tender and red.  Some breakdown at new G tube site.  Treatment options for stomal/peristomal skin: demonstrated stoma powder and skin prep x2 to irritated skin  Applied large barrier seal around stoma and 1 piece flat pouch.  Warmed pouch after application to promote seal.  Output: liquid yellow effluent Ostomy pouching: 1pc. Flat with barrier seal  Education provided:  demonstrated new pouching techinique.  Provided pouches, Eakin seals, powder and skin prep      Impression/dx  Irritant contact dermatiitis Former G tube site  Discussion  Demonstrated crusting around stoma to protect peristomal skin   Plan  See back one week Will try and enroll with [facility] for supplies    Visit time: 45 minutes.   Darice Cooley FNP-BC

## 2024-02-10 NOTE — Discharge Instructions (Addendum)
 Remove old pouch and clean with mild soap and water   pat dry Apply stoma powder to irritated skin around open wound and feeding tube site if needed. Seal with No Sting skin prep Apply Large barrier seal Apply pouch Change when leaking as needed.  I will set up with [facility]  See you in one week

## 2024-02-13 ENCOUNTER — Encounter (INDEPENDENT_AMBULATORY_CARE_PROVIDER_SITE_OTHER): Payer: Self-pay | Admitting: Primary Care

## 2024-02-13 ENCOUNTER — Ambulatory Visit (INDEPENDENT_AMBULATORY_CARE_PROVIDER_SITE_OTHER): Payer: MEDICAID | Admitting: Primary Care

## 2024-02-13 ENCOUNTER — Telehealth: Payer: Self-pay

## 2024-02-13 ENCOUNTER — Other Ambulatory Visit (HOSPITAL_COMMUNITY): Payer: Self-pay | Admitting: Nurse Practitioner

## 2024-02-13 VITALS — BP 107/68 | HR 93 | Temp 98.4°F | Resp 16 | Ht 71.0 in | Wt 169.4 lb

## 2024-02-13 DIAGNOSIS — Z09 Encounter for follow-up examination after completed treatment for conditions other than malignant neoplasm: Secondary | ICD-10-CM

## 2024-02-13 DIAGNOSIS — K9400 Colostomy complication, unspecified: Secondary | ICD-10-CM

## 2024-02-13 DIAGNOSIS — L24B3 Irritant contact dermatitis related to fecal or urinary stoma or fistula: Secondary | ICD-10-CM

## 2024-02-13 NOTE — Progress Notes (Signed)
 Subjective:   [name] is a 53 y.o. female presents for hospital follow up .  Patient has been hospitalized admit date to the hospital on several different occasions due to nausea and vomiting unknown etiology a pancreatic mass was found which has been ruled out , this is okay I do not manage is no way as was 02/01/24, patient was discharged from the hospital on 02/06/24, patient was admitted for: Small bowel obstruction.  She is being followed by GI and oncology.  Today does not look in acute distress actually a smile on her face.  She still has 1 drain and she is able to eat soft foods and has been bowel movements.  Past Medical History:  Diagnosis Date   Allergy    seasonal allergies   Anxiety    on meds   Asthma    uses inhaler   Breast cancer (HCC) 2012   RIGHT lumpectomy   Cancer (HCC) 2022   RIGHT breast lump-dx 2022   Depression    on meds   DVT (deep venous thrombosis) (HCC) 2010   after hysterectomy   Family history of uterine cancer    GERD (gastroesophageal reflux disease)    with certain foods/OTC PRN meds   Headache(784.0)    History of radiation therapy    Bilateral breast- 09/03/21-11/02/21- Dr. Lynwood Nasuti   Hyperlipidemia    on meds   Hypertension    on meds   SVT (supraventricular tachycardia) (HCC)      No Known Allergies  Current Outpatient Medications on File Prior to Visit  Medication Sig Dispense Refill   calcitRIOL  (ROCALTROL ) 0.5 MCG capsule Take 1 capsule (0.5 mcg total) by mouth daily. (Patient not taking: Reported on 02/08/2024) 30 capsule 0   Calcium  Carb-Cholecalciferol  600-10 MG-MCG TABS Take 1 tablet by mouth 3 (three) times daily. (Patient not taking: Reported on 02/08/2024) 90 tablet 0   dexamethasone  (DECADRON ) 4 MG tablet Take 2 tablets (8 mg) by mouth daily for 3 days starting the day after chemotherapy. Take with food. 30 tablet 1   fentaNYL  (DURAGESIC ) 12 MCG/HR Place 1 patch onto the skin every 3 (three) days. 5 patch 0    gabapentin  (NEURONTIN ) 300 MG capsule Take 1 capsule (300 mg total) by mouth 3 (three) times daily. (Patient not taking: Reported on 02/08/2024) 90 capsule 6   lidocaine -prilocaine  (EMLA ) cream Apply to affected area once 30 g 3   Multiple Vitamin (MULTIVITAMIN WITH MINERALS) TABS tablet Take 1 tablet by mouth daily.     ondansetron  (ZOFRAN ) 8 MG tablet Take 1 tablet (8 mg total) by mouth every 8 (eight) hours as needed for nausea or vomiting. Start on the third day after chemotherapy. 30 tablet 1   oxyCODONE  (ROXICODONE ) 15 MG immediate release tablet Take 1 tablet (15 mg total) by mouth every 4 (four) hours as needed for pain. 60 tablet 0   pantoprazole  (PROTONIX ) 40 MG tablet Take 1 tablet (40 mg total) by mouth 2 (two) times daily. (Patient not taking: Reported on 02/08/2024) 60 tablet 0   polyethylene glycol powder (GLYCOLAX /MIRALAX ) 17 GM/SCOOP powder Mix 17 g in 4 oz of liquid and take by mouth 2 (two) times daily as needed. (Patient not taking: Reported on 02/08/2024) 3350 g 1   prochlorperazine  (COMPAZINE ) 10 MG tablet Take 1 tablet (10 mg total) by mouth every 6 (six) hours as needed for nausea or vomiting. (Patient not taking: Reported on 02/08/2024) 30 tablet 1   prochlorperazine  (COMPAZINE ) 10  MG tablet Take 1 tablet (10 mg total) by mouth every 6 (six) hours as needed for nausea or vomiting. 30 tablet 1   senna (SENOKOT) 8.6 MG TABS tablet Take 2 tablets (17.2 mg total) by mouth 2 (two) times daily. (Patient not taking: Reported on 02/08/2024) 90 tablet 3   thiamine  (VITAMIN B1) 100 MG tablet Take 1 tablet (100 mg total) by mouth daily. (Patient not taking: Reported on 02/08/2024) 30 tablet 0   No current facility-administered medications on file prior to visit.    Review of System: ROS Comprehensive ROS Pertinent positive and negative noted in HPI   Objective:  BP 107/68   Pulse 93   Temp 98.4 F (36.9 C) (Oral)   Resp 16   Ht 5' 11 (1.803 m)   Wt 169 lb 6.4 oz (76.8 kg)   SpO2  99%   BMI 23.63 kg/m    Physical Exam Vitals reviewed.  HENT:     Head: Normocephalic.     Right Ear: Tympanic membrane, ear canal and external ear normal.     Left Ear: Tympanic membrane, ear canal and external ear normal.     Nose: Nose normal.     Mouth/Throat:     Mouth: Mucous membranes are moist.  Eyes:     Extraocular Movements: Extraocular movements intact.  Cardiovascular:     Rate and Rhythm: Normal rate and regular rhythm.  Pulmonary:     Effort: Pulmonary effort is normal.     Breath sounds: Normal breath sounds.  Abdominal:     General: Bowel sounds are normal.     Palpations: Abdomen is soft.     Comments: drainage  Musculoskeletal:        General: Normal range of motion.     Cervical back: Normal range of motion.  Skin:    General: Skin is warm and dry.  Neurological:     Mental Status: She is alert and oriented to person, place, and time.  Psychiatric:        Mood and Affect: Mood normal.        Behavior: Behavior normal.        Thought Content: Thought content normal.      Assessment:  Chelsea Hansen was seen today for hospitalization follow-up.  Diagnoses and all orders for this visit:  Follow-up exam     This note has been created with Education officer, environmental. Any transcriptional errors are unintentional.   No follow-ups on file.  Chelsea SHAUNNA Bohr, NP 02/13/2024, 9:29 AM

## 2024-02-13 NOTE — Telephone Encounter (Signed)
 Pt called to confirm appts, no further needs at this time.

## 2024-02-14 ENCOUNTER — Other Ambulatory Visit (HOSPITAL_COMMUNITY): Payer: Self-pay

## 2024-02-14 ENCOUNTER — Inpatient Hospital Stay (HOSPITAL_BASED_OUTPATIENT_CLINIC_OR_DEPARTMENT_OTHER): Payer: MEDICAID | Admitting: Nurse Practitioner

## 2024-02-14 ENCOUNTER — Telehealth: Payer: Self-pay

## 2024-02-14 ENCOUNTER — Encounter: Payer: Self-pay | Admitting: Nurse Practitioner

## 2024-02-14 VITALS — BP 103/70 | HR 92 | Temp 97.6°F | Resp 16 | Wt 172.7 lb

## 2024-02-14 DIAGNOSIS — Z515 Encounter for palliative care: Secondary | ICD-10-CM

## 2024-02-14 DIAGNOSIS — Z170 Estrogen receptor positive status [ER+]: Secondary | ICD-10-CM

## 2024-02-14 DIAGNOSIS — C50911 Malignant neoplasm of unspecified site of right female breast: Secondary | ICD-10-CM

## 2024-02-14 DIAGNOSIS — R530 Neoplastic (malignant) related fatigue: Secondary | ICD-10-CM

## 2024-02-14 DIAGNOSIS — C50411 Malignant neoplasm of upper-outer quadrant of right female breast: Secondary | ICD-10-CM

## 2024-02-14 DIAGNOSIS — R630 Anorexia: Secondary | ICD-10-CM | POA: Diagnosis not present

## 2024-02-14 DIAGNOSIS — G893 Neoplasm related pain (acute) (chronic): Secondary | ICD-10-CM | POA: Diagnosis not present

## 2024-02-14 DIAGNOSIS — C7951 Secondary malignant neoplasm of bone: Secondary | ICD-10-CM

## 2024-02-14 LAB — SURGICAL PATHOLOGY

## 2024-02-14 MED ORDER — OXYCODONE HCL 15 MG PO TABS
15.0000 mg | ORAL_TABLET | ORAL | 0 refills | Status: DC | PRN
  Filled 2024-02-17: qty 90, 15d supply, fill #0

## 2024-02-14 NOTE — Progress Notes (Signed)
 Palliative Medicine [facility]  Telephone:([phone] Fax:([phone]) [phone]   Name: Chelsea Hansen Date: 02/14/2024 MRN: [id number]  DOB: Oct 19, 1970  Patient Care Team: [name], NP as PCP - General (Internal Medicine) [name], DO as PCP - Cardiology (Cardiology) [name], MD as Consulting Physician (Radiation Oncology) [name], MD as Consulting Physician (General Surgery) [name], MD as Consulting Physician (Hematology and Oncology) [name], [name], NP as Nurse Practitioner (Nurse Practitioner) Crawford, Morna Pickle, NP as Nurse Practitioner (Hematology and Oncology) [name], RN as VBCI Care Management   INTERVAL HISTORY: Chelsea Hansen is a 53 y.o. 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  MRI of L-spine (12/23) showed L3 pathologic fracture and significant lesion along S1 with sclerotic changes.  Palliative ask to see for symptom management and goals of care.   SOCIAL HISTORY:     reports that she has never smoked. She has never used smokeless tobacco. She reports that she does not currently use alcohol after a past usage of about 7.0 standard drinks of alcohol per week. She reports that she does not use drugs.  ADVANCE DIRECTIVES:   CODE STATUS:   PAST MEDICAL HISTORY: Past Medical History:  Diagnosis Date   Allergy    seasonal allergies   Anxiety    on meds   Asthma    uses inhaler   Breast cancer (HCC) 2012   RIGHT lumpectomy   Cancer (HCC) 2022   RIGHT breast lump-dx 2022   Depression    on meds   DVT (deep venous thrombosis) (HCC) 2010   after hysterectomy   Family history of  uterine cancer    GERD (gastroesophageal reflux disease)    with certain foods/OTC PRN meds   Headache(784.0)    History of radiation therapy    Bilateral breast- 09/03/21-11/02/21- Dr. Agent Shannon   Hyperlipidemia    on meds   Hypertension    on meds   SVT (supraventricular tachycardia) (HCC)     ALLERGIES:  has no known allergies.  MEDICATIONS:  Current Outpatient Medications  Medication Sig Dispense Refill   dexamethasone  (DECADRON ) 4 MG tablet Take 2 tablets (8 mg) by mouth daily for 3 days starting the day after chemotherapy. Take with food. 30 tablet 1   fentaNYL  (DURAGESIC ) 12 MCG/HR Place 1 patch onto the skin every 3 (three) days. 5 patch 0   lidocaine -prilocaine  (EMLA ) cream Apply to affected area once 30 g 3   Multiple Vitamin (MULTIVITAMIN WITH MINERALS) TABS tablet Take 1 tablet by mouth daily.     ondansetron  (ZOFRAN ) 8 MG tablet Take 1 tablet (8 mg total) by mouth every 8 (eight) hours as needed for nausea or vomiting. Start on the third day after chemotherapy. 30 tablet 1   pantoprazole  (PROTONIX ) 40 MG tablet Take 1 tablet (40 mg total) by mouth 2 (two) times daily. 60 tablet 0   polyethylene glycol powder (GLYCOLAX /MIRALAX ) 17 GM/SCOOP powder Mix 17 g in 4 oz of liquid and take by mouth 2 (two) times daily as needed. 3350 g 1   prochlorperazine  (COMPAZINE ) 10 MG tablet Take 1 tablet (10 mg total) by mouth every 6 (six) hours as needed for nausea or vomiting. 30 tablet 1  senna (SENOKOT) 8.6 MG TABS tablet Take 2 tablets (17.2 mg total) by mouth 2 (two) times daily. 90 tablet 3   thiamine  (VITAMIN B1) 100 MG tablet Take 1 tablet (100 mg total) by mouth daily. 30 tablet 0   [START ON 02/17/2024] oxyCODONE  (ROXICODONE ) 15 MG immediate release tablet Take 1 tablet (15 mg total) by mouth every 4 (four) hours as needed for pain. 90 tablet 0   No current facility-administered medications for this visit.    VITAL SIGNS: BP 103/70 (BP Location: Left Arm, Patient Position:  Sitting)   Pulse 92   Temp 97.6 F (36.4 C) (Temporal)   Resp 16   Wt 172 lb 11.2 oz (78.3 kg)   SpO2 100%   BMI 24.09 kg/m  Filed Weights   02/14/24 0938  Weight: 172 lb 11.2 oz (78.3 kg)     Estimated body mass index is 24.09 kg/m as calculated from the following:   Height as of 02/13/24: 5' 11 (1.803 m).   Weight as of this encounter: 172 lb 11.2 oz (78.3 kg).   PERFORMANCE STATUS (ECOG) : 1 - Symptomatic but completely ambulatory  Assessment NAD, weak appearing  RRR, hypotensive Normal breathing pattern  Abdominal tenderness, G-tube and J-tube in place. Dressings intact.  AAO x3  IMPRESSION: Discussed the use of AI scribe software for clinical note transcription with the patient, who gave verbal consent to proceed.  History of Present Illness Chelsea Hansen is a 53 year old female with metastatic breast cancer who presents to palliative clinic with pain management. She is doing exceptionally well overall compared to previous visit. Patient is much appreciative of feeling somewhat like herself again   She experiences fluctuating energy levels. Appetite has been good. Current weight is 172lbs up from 168lbs. She is tolerating he diet with no concerns for nausea or vomiting.   Patient shares plans to restart chemotherapy later this week with expectation of treatment every 3 weeks.  She is somewhat anxious about potential side effects but knows to contact the office with any concerns.  Chelsea Hansen is complaining of ongoing pain in her abdomen and back.  States current regimen provides her some relief however pain remains persistent. She takes oxycodone  15 mg every four hours, but her insurance, [employer], only covers the 7.5 mg dose, causing financial strain.  Patient was started on fentanyl  patch during hospitalization however unable to obtain postdischarge due to lack of insurance payment and high cost.  She has a celiac plexus block scheduled for September 12th,  but she was not informed about this procedure being scheduled outpatient as she expected it to be completed during her recent hospitalization. She plans to contact the facility to reschedule as she will not be available on the scheduled date.  Goals of Care Patient is clear in her expressed wishes to continue to treat the treatable aggressively. She is not interested in consider comfort focused or end-of-life care at this time. Although she has some uncertainty of next steps or her wishes on how to proceed living is most important to her. Emotional support provided.   She wants to return to work and is managing her appointments around her treatment schedule. We discussed the extensiveness of her recent health challenges with understanding she has to allow time and unfortunately is not ready to return to work. We discussed importance of preventing any set-backs. She verbalized understanding.   We discussed her current illness at length and disease trajectory. Chelsea Hansen understanding  that her health has significantly change, quality of life has declined, and the challenges that the team are trying to assist patient with to the best of our ability.   We discussed seriousness of her overall condition and possible need for hospitalization. Patient does not enjoy being hospitalized. During previous hospitalization there was discussions and consideration for patient to follow-up at another educational institution however this has not been an option for her after discussing with family concerns are location and being away from home if something happened and If anything different could be done in addition to what the follow-ups would look like specifically having to drive out of town etc.    We will continue to closely monitor.   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 Managed with oxycodone  15 mg every four hours. Insurance issues limit options for pain management, requiring exploration of alternatives. - Continue oxycodone  15 mg every four hours. - Investigate insurance coverage for alternative pain medications. Unable to obtain Fentanyl  patch as prescribed and started during recent hospitalization.  - Discuss potential intrathecal pain pump. Methadone, or fentanyl  patch at future visits pending visit outcomes today.  - Schedule celiac plexus block on 03/02/24  Malignant neoplasm undergoing chemotherapy Undergoing chemotherapy with a less aggressive regimen, treatments every three weeks for ten weeks. Managing well with no reported nausea or gastrointestinal issues. - Continue chemotherapy regimen with treatments every three weeks per Oncology - Administer dexamethasone  starting the day after chemotherapy for three days. - Administer Protonix  twice daily for gastric acid management. - Ensure Compazine  availability for nausea, though not currently needed.  I will plan to see patient back in 3-4 weeks due to out of office for several weeks. Patient knows to call with any concerns.   Patient expressed understanding and was in agreement with this plan. She also understands that She can call the clinic at any time with any questions, concerns, or complaints.     Any controlled substances utilized were prescribed in the context of palliative care. PDMP has been reviewed.    Visit consisted of counseling and education dealing with the complex and emotionally intense issues of symptom management and palliative care in the setting of serious and potentially life-threatening illness.  Levon Borer, AGPCNP-BC  Palliative Medicine Team/[facility]

## 2024-02-14 NOTE — Telephone Encounter (Signed)
 Received PA approval for pt fentanyl  patches, confirmed with pharmacy and notified pt, no further needs at this time.

## 2024-02-15 ENCOUNTER — Other Ambulatory Visit: Payer: Self-pay | Admitting: *Deleted

## 2024-02-15 ENCOUNTER — Other Ambulatory Visit: Payer: MEDICAID | Admitting: *Deleted

## 2024-02-15 DIAGNOSIS — Z5181 Encounter for therapeutic drug level monitoring: Secondary | ICD-10-CM

## 2024-02-15 DIAGNOSIS — R001 Bradycardia, unspecified: Secondary | ICD-10-CM

## 2024-02-15 DIAGNOSIS — R9431 Abnormal electrocardiogram [ECG] [EKG]: Secondary | ICD-10-CM

## 2024-02-15 DIAGNOSIS — I471 Supraventricular tachycardia, unspecified: Secondary | ICD-10-CM

## 2024-02-15 MED FILL — Fosaprepitant Dimeglumine For IV Infusion 150 MG (Base Eq): INTRAVENOUS | Qty: 5 | Status: AC

## 2024-02-15 NOTE — Patient Instructions (Signed)
 Visit Information  Thank you for taking time to visit with me today. Please don't hesitate to contact me if I can be of assistance to you before our next scheduled telephone appointment.   Following is a copy of your care plan:   Goals Addressed             This Visit's Progress    VBCI Transitions of Care (TOC) Care Plan       Problems:  Recent Hospitalization for treatment of SBO Knowledge Deficit Related to ostomy care  Goal:  Over the next 30 days, the patient will not experience hospital readmission  Interventions:  Transitions of Care: Doctor Visits  - discussed the importance of doctor visits Post discharge activity limitations prescribed by provider reviewed Post-op wound/incision care reviewed with patient/caregiver Reviewed Signs and symptoms of infection Reviewed upcoming appointments including:02/16/24 with [facility], 02/17/24 with Ostomy clinic and 02/16/24 Palliative Care  Reviewed provider notes from recent visits Discussed patient will start chemotherapy on 02/16/24 and have infusions every 3 weeks Provided patient information on [employer] to inquire about free ostomy supplies  Patient Self Care Activities:  Attend all scheduled provider appointments Call provider office for new concerns or questions  Notify RN Care Manager of [facility] call rescheduling needs Participate in Transition of Care Program/Attend [facility] scheduled calls  Plan:  Telephone follow up appointment with care management team member scheduled for:  02/23/24 at 1:30pm        Patient verbalizes understanding of instructions and care plan provided today and agrees to view in MyChart. Active MyChart status and patient understanding of how to access instructions and care plan via MyChart confirmed with patient.     Telephone follow up appointment with care management team member scheduled for:02/23/24 at 1:30pm  Please call the care guide team at [phone] if you need to cancel or reschedule your  appointment.   Please call [phone]-TALK (toll free, 24 hour hotline) go to [facility] Urgent [facility] [address] [place] [phone]) call 911 if you are experiencing a Mental Health or Behavioral Health Crisis or need someone to talk to.  [name] RN, BSN [place]  [facility] RN Care Manager [phone]

## 2024-02-15 NOTE — Transitions of Care (Post Inpatient/ED Visit) (Signed)
 Transition of Care week 2  Visit Note  02/15/2024  Name: Chelsea Hansen MRN: [id number]          DOB: Sep 26, 1970  Situation: Patient enrolled in [facility] 30-day program. Visit completed with Ms. Doddridge by telephone.   Background:   Initial Transition Care Management Follow-up Telephone Call    Past Medical History:  Diagnosis Date   Allergy    seasonal allergies   Anxiety    on meds   Asthma    uses inhaler   Breast cancer (HCC) 2012   RIGHT lumpectomy   Cancer (HCC) 2022   RIGHT breast lump-dx 2022   Depression    on meds   DVT (deep venous thrombosis) (HCC) 2010   after hysterectomy   Family history of uterine cancer    GERD (gastroesophageal reflux disease)    with certain foods/OTC PRN meds   Headache(784.0)    History of radiation therapy    Bilateral breast- 09/03/21-11/02/21- Dr. Lynwood Nasuti   Hyperlipidemia    on meds   Hypertension    on meds   SVT (supraventricular tachycardia) (HCC)     Assessment: Patient Reported Symptoms: Cognitive Cognitive Status: Able to follow simple commands, Alert and oriented to person, place, and time, Normal speech and language skills      Neurological Neurological Review of Symptoms: No symptoms reported    HEENT HEENT Symptoms Reported: Not assessed      Cardiovascular Cardiovascular Symptoms Reported: No symptoms reported    Respiratory Respiratory Symptoms Reported: No symptoms reported    Endocrine Endocrine Symptoms Reported: Not assessed Is patient diabetic?: No    Gastrointestinal Gastrointestinal Symptoms Reported: No symptoms reported Additional Gastrointestinal Details: G tube in place, tolerating a regular diet. Gastrointestinal Management Strategies: Adequate rest, Coping strategies Gastrointestinal Self-Management Outcome: 4 (good) Gastrointestinal Comment: Eating a regular diet. Patient has supplies for Ostomy care.  Follow up with Ostomy Clinic on 02/17/24.    Genitourinary Genitourinary Symptoms  Reported: Not assessed    Integumentary Integumentary Symptoms Reported: No symptoms reported    Musculoskeletal Musculoskelatal Symptoms Reviewed: Not assessed        Psychosocial Psychosocial Symptoms Reported: Not assessed         There were no vitals filed for this visit.  Medications Reviewed Today     Reviewed by [name], RN (Registered Nurse) on 02/15/24 at 1438  Med List Status: <None>   Medication Order Taking? Sig Documenting Provider Last Dose Status Informant  dexamethasone  (DECADRON ) 4 MG tablet [id number]  Take 2 tablets (8 mg) by mouth daily for 3 days starting the day after chemotherapy. Take with food. [name], [name], MD  Active   fentaNYL  (DURAGESIC ) 12 MCG/HR [id number]  Place 1 patch onto the skin every 3 (three) days. Samtani, Jai-Gurmukh, MD  Active   lidocaine -prilocaine  (EMLA ) cream [id number]  Apply to affected area once [name], [name], MD  Active   Multiple Vitamin (MULTIVITAMIN WITH MINERALS) TABS tablet [id number]  Take 1 tablet by mouth daily. Samtani, Jai-Gurmukh, MD  Active   ondansetron  (ZOFRAN ) 8 MG tablet [id number]  Take 1 tablet (8 mg total) by mouth every 8 (eight) hours as needed for nausea or vomiting. Start on the third day after chemotherapy. [name], [name], MD  Active   oxyCODONE  (ROXICODONE ) 15 MG immediate release tablet [id number]  Take 1 tablet (15 mg total) by mouth every 4 (four) hours as needed for pain. [name], [name], NP  Active   pantoprazole  (PROTONIX ) 40 MG tablet [id number]  Take 1 tablet (40 mg total) by mouth 2 (two) times daily. [name], NP  Active Self, Pharmacy Records, Multiple Informants  polyethylene glycol powder (GLYCOLAX /MIRALAX ) 17 GM/SCOOP powder [id number]  Mix 17 g in 4 oz of liquid and take by mouth 2 (two) times daily as needed. [name], NP  Active Self, Pharmacy Records, Multiple Informants           Med Note (CRUTHIS, CHLOE C   Tue Dec 13, 2023  8:00 AM)    prochlorperazine   (COMPAZINE ) 10 MG tablet [id number]  Take 1 tablet (10 mg total) by mouth every 6 (six) hours as needed for nausea or vomiting. [name], [name], MD  Active   senna (SENOKOT) 8.6 MG TABS tablet [id number]  Take 2 tablets (17.2 mg total) by mouth 2 (two) times daily. [name], [name], NP  Active Self, Pharmacy Records, Multiple Informants  thiamine  (VITAMIN B1) 100 MG tablet [id number]  Take 1 tablet (100 mg total) by mouth daily. Samtani, Jai-Gurmukh, MD  Active   Med List Note Rockney Norleen LABOR, RPH-CPP 07/07/22 9196): Abemaciclib  through [facility];   UDS 02/11/22  MR 05/22/22 Medication Agreement signed. LP  Hydrocodone  approved through 5 13 23             Recommendation:   Continue Current Plan of Care  Follow Up Plan:   Telephone follow-up in 1 week  [name] RN, BSN [place]  Value[facility]Based [facility] RN Care Manager [phone]

## 2024-02-16 ENCOUNTER — Ambulatory Visit: Payer: MEDICAID

## 2024-02-16 ENCOUNTER — Encounter: Payer: Self-pay | Admitting: Hematology and Oncology

## 2024-02-16 ENCOUNTER — Ambulatory Visit: Payer: MEDICAID | Admitting: Nutrition

## 2024-02-16 ENCOUNTER — Inpatient Hospital Stay: Payer: MEDICAID | Admitting: Adult Health

## 2024-02-16 ENCOUNTER — Ambulatory Visit (HOSPITAL_BASED_OUTPATIENT_CLINIC_OR_DEPARTMENT_OTHER): Payer: MEDICAID | Admitting: Physician Assistant

## 2024-02-16 ENCOUNTER — Inpatient Hospital Stay: Payer: MEDICAID

## 2024-02-16 ENCOUNTER — Encounter: Payer: Self-pay | Admitting: General Practice

## 2024-02-16 ENCOUNTER — Other Ambulatory Visit: Payer: Self-pay

## 2024-02-16 ENCOUNTER — Encounter: Payer: Self-pay | Admitting: Adult Health

## 2024-02-16 VITALS — BP 100/79 | HR 98 | Temp 98.6°F | Resp 17 | Wt 172.4 lb

## 2024-02-16 VITALS — BP 103/65 | HR 72 | Temp 98.1°F | Resp 17

## 2024-02-16 VITALS — BP 122/71 | HR 71 | Resp 18

## 2024-02-16 DIAGNOSIS — Z95828 Presence of other vascular implants and grafts: Secondary | ICD-10-CM

## 2024-02-16 DIAGNOSIS — Z170 Estrogen receptor positive status [ER+]: Secondary | ICD-10-CM

## 2024-02-16 DIAGNOSIS — Z5112 Encounter for antineoplastic immunotherapy: Secondary | ICD-10-CM | POA: Diagnosis not present

## 2024-02-16 DIAGNOSIS — C50919 Malignant neoplasm of unspecified site of unspecified female breast: Secondary | ICD-10-CM

## 2024-02-16 DIAGNOSIS — E876 Hypokalemia: Secondary | ICD-10-CM

## 2024-02-16 DIAGNOSIS — C50411 Malignant neoplasm of upper-outer quadrant of right female breast: Secondary | ICD-10-CM | POA: Diagnosis not present

## 2024-02-16 LAB — CMP (CANCER CENTER ONLY)
ALT: 38 U/L (ref 0–44)
AST: 39 U/L (ref 15–41)
Albumin: 3.1 g/dL — ABNORMAL LOW (ref 3.5–5.0)
Alkaline Phosphatase: 1046 U/L — ABNORMAL HIGH (ref 38–126)
Anion gap: 7 (ref 5–15)
BUN: 6 mg/dL (ref 6–20)
CO2: 26 mmol/L (ref 22–32)
Calcium: 7.7 mg/dL — ABNORMAL LOW (ref 8.9–10.3)
Chloride: 105 mmol/L (ref 98–111)
Creatinine: 0.42 mg/dL — ABNORMAL LOW (ref 0.44–1.00)
GFR, Estimated: >60 mL/min (ref >60)
Glucose, Bld: 101 mg/dL — ABNORMAL HIGH (ref 70–99)
Potassium: 3.1 mmol/L — ABNORMAL LOW (ref 3.5–5.1)
Sodium: 138 mmol/L (ref 135–145)
Total Bilirubin: 0.5 mg/dL (ref 0.0–1.2)
Total Protein: 6.1 g/dL — ABNORMAL LOW (ref 6.5–8.1)

## 2024-02-16 LAB — CBC WITH DIFFERENTIAL (CANCER CENTER ONLY)
Abs Immature Granulocytes: 0.18 K/uL — ABNORMAL HIGH (ref 0.00–0.07)
Basophils Absolute: 0 K/uL (ref 0.0–0.1)
Basophils Relative: 1 %
Eosinophils Absolute: 0 K/uL (ref 0.0–0.5)
Eosinophils Relative: 1 %
HCT: 24.6 % — ABNORMAL LOW (ref 36.0–46.0)
Hemoglobin: 8.3 g/dL — ABNORMAL LOW (ref 12.0–15.0)
Immature Granulocytes: 5 %
Lymphocytes Relative: 34 %
Lymphs Abs: 1.3 K/uL (ref 0.7–4.0)
MCH: 25.8 pg — ABNORMAL LOW (ref 26.0–34.0)
MCHC: 33.7 g/dL (ref 30.0–36.0)
MCV: 76.4 fL — ABNORMAL LOW (ref 80.0–100.0)
Monocytes Absolute: 0.4 K/uL (ref 0.1–1.0)
Monocytes Relative: 11 %
Neutro Abs: 1.8 K/uL (ref 1.7–7.7)
Neutrophils Relative %: 48 %
Platelet Count: 127 K/uL — ABNORMAL LOW (ref 150–400)
RBC: 3.22 MIL/uL — ABNORMAL LOW (ref 3.87–5.11)
RDW: 20.7 % — ABNORMAL HIGH (ref 11.5–15.5)
WBC Count: 3.7 K/uL — ABNORMAL LOW (ref 4.0–10.5)
nRBC: 0.8 % — ABNORMAL HIGH (ref 0.0–0.2)

## 2024-02-16 MED ORDER — ACETAMINOPHEN 325 MG PO TABS
650.0000 mg | ORAL_TABLET | Freq: Once | ORAL | Status: AC
  Administered 2024-02-16: 650 mg via ORAL

## 2024-02-16 MED ORDER — ACETAMINOPHEN 325 MG PO TABS
650.0000 mg | ORAL_TABLET | Freq: Once | ORAL | Status: DC
  Filled 2024-02-16: qty 2

## 2024-02-16 MED ORDER — DEXAMETHASONE SODIUM PHOSPHATE 10 MG/ML IJ SOLN
10.0000 mg | Freq: Once | INTRAMUSCULAR | Status: AC
  Administered 2024-02-16: 10 mg via INTRAVENOUS
  Filled 2024-02-16: qty 1

## 2024-02-16 MED ORDER — FAMOTIDINE IN NACL 20-0.9 MG/50ML-% IV SOLN
20.0000 mg | Freq: Once | INTRAVENOUS | Status: AC | PRN
  Administered 2024-02-16: 20 mg via INTRAVENOUS

## 2024-02-16 MED ORDER — SODIUM CHLORIDE 0.9 % IV SOLN: Freq: Once | INTRAVENOUS | Status: DC | PRN

## 2024-02-16 MED ORDER — POTASSIUM CHLORIDE 10 MEQ/100ML IV SOLN
10.0000 meq | INTRAVENOUS | Status: AC
  Administered 2024-02-16 (×2): 10 meq via INTRAVENOUS
  Filled 2024-02-16 (×2): qty 100

## 2024-02-16 MED ORDER — SODIUM CHLORIDE 0.9% FLUSH
10.0000 mL | Freq: Once | INTRAVENOUS | Status: AC | PRN
  Administered 2024-02-16: 10 mL

## 2024-02-16 MED ORDER — DIPHENHYDRAMINE HCL 25 MG PO CAPS
50.0000 mg | ORAL_CAPSULE | Freq: Once | ORAL | Status: DC
  Filled 2024-02-16: qty 2

## 2024-02-16 MED ORDER — FAM-TRASTUZUMAB DERUXTECAN-NXKI CHEMO 100 MG IV SOLR
3.2000 mg/kg | Freq: Once | INTRAVENOUS | Status: AC
  Administered 2024-02-16: 260 mg via INTRAVENOUS
  Filled 2024-02-16: qty 13

## 2024-02-16 MED ORDER — SODIUM CHLORIDE 0.9% FLUSH
10.0000 mL | INTRAVENOUS | Status: DC | PRN
  Administered 2024-02-16: 10 mL

## 2024-02-16 MED ORDER — PALONOSETRON HCL INJECTION 0.25 MG/5ML
0.2500 mg | Freq: Once | INTRAVENOUS | Status: AC
  Administered 2024-02-16: 0.25 mg via INTRAVENOUS
  Filled 2024-02-16: qty 5

## 2024-02-16 MED ORDER — SODIUM CHLORIDE 0.9 % IV SOLN
150.0000 mg | Freq: Once | INTRAVENOUS | Status: AC
  Administered 2024-02-16: 150 mg via INTRAVENOUS
  Filled 2024-02-16: qty 150

## 2024-02-16 MED ORDER — DEXTROSE 5 % IV SOLN
INTRAVENOUS | Status: DC
Start: 2024-02-16 — End: 2024-02-16

## 2024-02-16 NOTE — Progress Notes (Signed)
 Per Dr. [name] on 8/27, okay to treat with Enhertu  on 8/28 with ECHO from 2022.

## 2024-02-16 NOTE — Progress Notes (Signed)
 [facility] Spiritual Care Note  Followed up with Chelsea Hansen in infusion. Her affect was brighter and her energy higher than on recent visits, and she states that she is feeling much better now that the one feeding tube is out; now we just have to work on the other.... Overall she was in good spirits, additionally citing progress in her chemotherapy regimen and learning that she does not have a new primary cancer.  Provided compassionate presence, reflective listening, emotional support, and encouragement. We plan to follow up at a future treatment.   [address] [name], [place], [facility] Pager [phone] Voicemail [phone]

## 2024-02-16 NOTE — Progress Notes (Signed)
 Hypersensitivity Reaction note  Date of event: 02/16/24 Time of event: 1425  Generic name of drug involved: Enhertu  Name of provider notified of the hypersensitivity reaction: Mallie ORN, PA and Dr. [name]  Was agent that likely caused hypersensitivity reaction added to Allergies List within EMR? Yes Chain of events including reaction signs/symptoms, treatment administered, and outcome (e.g., drug resumed; drug discontinued; sent to Emergency Department; etc.) Half way through her Enhertu , patient complained of sever headache 10 out of 10 pain scale, also C/O of chest pain 5 out of 10 pain scale. 1425: Enhertu  stopped, line flushed with dextrose , Mallie Combes- PA notified 1430: IVF(Normal saline) started. Vitals: 99..3, 96/64, 98%-RA. Mallie assessing patient. 1430 IV Pepcid  given, PO tylenol  also given 1445: Patient stated her chest pain have gotten better. Vitals: 121/77, 69, 100-RA 1500: Patient feeling much better, stated her chest pain is gone and headache is improving. Vitals: 122/78, 70, 18, 100%-RA 1502: Enhertu  restarted her Dr. [name] and Katie-PA 1540: Patient tolerated the rest of the Enhertu    Filbert Milo Mallie, RN 02/16/2024 4:37 PM

## 2024-02-16 NOTE — Patient Instructions (Signed)
 [facility] - A DEPT OF [facility]  Discharge Instructions: Thank you for choosing [facility] to provide your oncology and hematology care.   If you have a lab appointment with the Cancer Center, please go directly to the Cancer Center and check in at the registration area.   Wear comfortable clothing and clothing appropriate for easy access to any Portacath or PICC line.   We strive to give you quality time with your provider. You may need to reschedule your appointment if you arrive late (15 or more minutes).  Arriving late affects you and other patients whose appointments are after yours.  Also, if you miss three or more appointments without notifying the office, you may be dismissed from the clinic at the provider's discretion.      For prescription refill requests, have your pharmacy contact our office and allow 72 hours for refills to be completed.    Today you received the following chemotherapy and/or immunotherapy agent: Fam- Trastuzumab  (Enhertu )   To help prevent nausea and vomiting after your treatment, we encourage you to take your nausea medication as directed.  BELOW ARE SYMPTOMS THAT SHOULD BE REPORTED IMMEDIATELY: *FEVER GREATER THAN 100.4 F (38 C) OR HIGHER *CHILLS OR SWEATING *NAUSEA AND VOMITING THAT IS NOT CONTROLLED WITH YOUR NAUSEA MEDICATION *UNUSUAL SHORTNESS OF BREATH *UNUSUAL BRUISING OR BLEEDING *URINARY PROBLEMS (pain or burning when urinating, or frequent urination) *BOWEL PROBLEMS (unusual diarrhea, constipation, pain near the anus) TENDERNESS IN MOUTH AND THROAT WITH OR WITHOUT PRESENCE OF ULCERS (sore throat, sores in mouth, or a toothache) UNUSUAL RASH, SWELLING OR PAIN  UNUSUAL VAGINAL DISCHARGE OR ITCHING   Items with * indicate a potential emergency and should be followed up as soon as possible or go to the Emergency Department if any problems should occur.  Please show the CHEMOTHERAPY ALERT CARD or  IMMUNOTHERAPY ALERT CARD at check-in to the Emergency Department and triage nurse.  Should you have questions after your visit or need to cancel or reschedule your appointment, please contact [facility] - A DEPT OF [facility]  Dept: [phone]  and follow the prompts.  Office hours are 8:00 a.m. to 4:30 p.m. Monday - Friday. Please note that voicemails left after 4:00 p.m. may not be returned until the following business day.  We are closed weekends and major holidays. You have access to a nurse at all times for urgent questions. Please call the main number to the clinic Dept: [phone] and follow the prompts.   For any non-urgent questions, you may also contact your provider using MyChart. We now offer e-Visits for anyone 72 and older to request care online for non-urgent symptoms. For details visit [email or web address].   Also download the MyChart app! Go to the app store, search MyChart, open the app, select [place], and log in with your MyChart username and password.  Fam-Trastuzumab  Deruxtecan Injection What is this medication? FAM-TRASTUZUMAB  DERUXTECAN (fam-tras TOOZ eu mab DER ux TEE kan) treats some types of cancer. It works by blocking a protein that causes cancer cells to grow and multiply. This helps to slow or stop the spread of cancer cells. This medicine may be used for other purposes; ask your health care provider or pharmacist if you have questions. COMMON BRAND NAME(S): ENHERTU  What should I tell my care team before I take this medication? They need to know if you have any of these conditions:  Heart disease Heart failure Infection, especially a viral infection, such as chickenpox, cold sores, or herpes Liver disease Lung or breathing disease, such as asthma or COPD An unusual or allergic reaction to fam-trastuzumab  deruxtecan, other medications, foods, dyes, or preservatives Pregnant or trying to get pregnant Breast-feeding How  should I use this medication? This medication is injected into a vein. It is given by your care team in a hospital or clinic setting. A special MedGuide will be given to you before each treatment. Be sure to read this information carefully each time. Talk to your care team about the use of this medication in children. Special care may be needed. Overdosage: If you think you have taken too much of this medicine contact a poison control center or emergency room at once. NOTE: This medicine is only for you. Do not share this medicine with others. What if I miss a dose? It is important not to miss your dose. Call your care team if you are unable to keep an appointment. What may interact with this medication? Interactions are not expected. This list may not describe all possible interactions. Give your health care provider a list of all the medicines, herbs, non-prescription drugs, or dietary supplements you use. Also tell them if you smoke, drink alcohol, or use illegal drugs. Some items may interact with your medicine. What should I watch for while using this medication? Visit your care team for regular checks on your progress. Tell your care team if your symptoms do not start to get better or if they get worse. This medication may increase your risk of getting an infection. Call your care team for advice if you get a fever, chills, sore throat, or other symptoms of a cold or flu. Do not treat yourself. Try to avoid being around people who are sick. Avoid taking medications that contain aspirin, acetaminophen , ibuprofen , naproxen , or ketoprofen unless instructed by your care team. These medications may hide a fever. Be careful brushing or flossing your teeth or using a toothpick because you may get an infection or bleed more easily. If you have any dental work done, tell your dentist you are receiving this medication. This medication may cause dry eyes and blurred vision. If you wear contact lenses, you  may feel some discomfort. Lubricating eye drops may help. See your care team if the problem does not go away or is severe. Talk to your care team if you may be pregnant. Serious birth defects can occur if you take this medication during pregnancy and for 7 months after the last dose. If your partner can get pregnant, use a condom during sex while taking this medication and for 4 months after the last dose. Do not breastfeed while taking this medication and for 7 months after the last dose. This medication may cause infertility. Talk to your care team if you are concerned about your fertility. What side effects may I notice from receiving this medication? Side effects that you should report to your care team as soon as possible: Allergic reactions--skin rash, itching, hives, swelling of the face, lips, tongue, or throat Dry cough, shortness of breath or trouble breathing Infection--fever, chills, cough, sore throat, wounds that don't heal, pain or trouble when passing urine, general feeling of discomfort or being unwell Heart failure--shortness of breath, swelling of the ankles, feet, or hands, sudden weight gain, unusual weakness or fatigue Unusual bruising or bleeding Side effects that usually do not require medical attention (report these to your care team  if they continue or are bothersome): Constipation Diarrhea Hair loss Muscle pain Nausea Vomiting This list may not describe all possible side effects. Call your doctor for medical advice about side effects. You may report side effects to FDA at 1-800-FDA-1088. Where should I keep my medication? This medication is given in a hospital or clinic. It will not be stored at home. NOTE: This sheet is a summary. It may not cover all possible information. If you have questions about this medicine, talk to your doctor, pharmacist, or health care provider.  2024 Elsevier/Gold Standard (2023-02-04 00:00:00)  Potassium Chloride  Injection What is  this medication? POTASSIUM CHLORIDE  (poe TASS i um KLOOR ide) prevents and treats low levels of potassium in your body. Potassium plays an important role in maintaining the health of your kidneys, heart, muscles, and nervous system. This medicine may be used for other purposes; ask your health care provider or pharmacist if you have questions. COMMON BRAND NAME(S): PROAMP What should I tell my care team before I take this medication? They need to know if you have any of these conditions: Addison disease Dehydration Diabetes (high blood sugar) Heart disease High levels of potassium in the blood Irregular heartbeat or rhythm Kidney disease Large areas of burned skin An unusual or allergic reaction to potassium, other medications, foods, dyes, or preservatives Pregnant or trying to get pregnant Breast-feeding How should I use this medication? This medication is injected into a vein. It is given in a hospital or clinic setting. Talk to your care team about the use of this medication in children. Special care may be needed. Overdosage: If you think you have taken too much of this medicine contact a poison control center or emergency room at once. NOTE: This medicine is only for you. Do not share this medicine with others. What if I miss a dose? This does not apply. This medication is not for regular use. What may interact with this medication? Do not take this medication with any of the following: Certain diuretics, such as spironolactone, triamterene Eplerenone Sodium polystyrene sulfonate This medication may also interact with the following: Certain medications for blood pressure or heart disease, such as lisinopril, losartan, quinapril, valsartan  Medications that lower your chance of fighting infection, such as cyclosporine, tacrolimus NSAIDs, medications for pain and inflammation, such as ibuprofen  or naproxen  Other potassium supplements Salt substitutes This list may not describe  all possible interactions. Give your health care provider a list of all the medicines, herbs, non-prescription drugs, or dietary supplements you use. Also tell them if you smoke, drink alcohol, or use illegal drugs. Some items may interact with your medicine. What should I watch for while using this medication? Visit your care team for regular checks on your progress. Tell your care team if your symptoms do not start to get better or if they get worse. You may need blood work while you are taking this medication. Avoid salt substitutes unless you are told otherwise by your care team. What side effects may I notice from receiving this medication? Side effects that you should report to your care team as soon as possible: Allergic reactions--skin rash, itching, hives, swelling of the face, lips, tongue, or throat High potassium level--muscle weakness, fast or irregular heartbeat Side effects that usually do not require medical attention (report to your care team if they continue or are bothersome): Diarrhea Nausea Stomach pain Vomiting This list may not describe all possible side effects. Call your doctor for medical advice about side effects. You  may report side effects to FDA at 1-800-FDA-1088. Where should I keep my medication? This medication is given in a hospital or clinic. It will not be stored at home. NOTE: This sheet is a summary. It may not cover all possible information. If you have questions about this medicine, talk to your doctor, pharmacist, or health care provider.  2024 Elsevier/Gold Standard (2021-12-18 00:00:00)

## 2024-02-16 NOTE — Progress Notes (Signed)
 Nutrition follow up completed with patient in infusion. 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  She is followed by Dr. [name].  Weight decreased to 172 pounds 6.4 oz on August 28 from 176 pounds 1.6 oz on August 20.  Labs include Potassium 3.1, Glucose 101, Creatinine 0.42, Albumin  3.1 and HGB 8.3.  Patient had multiple recent hospitalizations where she had a GJ bypass and then placement of GJ tube on July 22. She did not tolerate J tube feedings and plan was to begin TPN. She was diagnosed with severe malnutrition. She is not currently on nutrition support.  History complicated. Please refer to notes section.  Patient has no complaints and denies nausea, vomiting, constipation and diarrhea. She drinks one carton of Ensure Complete daily. Noted former tube site continues to leak and irritate skin per [facility] note. Reports food insecurity.  Nutrition Diagnosis: Severe Malnutrition related to cancer and cancer related treatments as evidenced by severe muscle depletion, energy intake < or equal to 75% for > or equal to 1 month, moderate muscle depletion, ongoing.  Intervention: Encouraged small amounts of high calorie, high protein foods throughout the day. Continue Ensure Complete once daily. Provided food bag from food pantry.  Monitoring, Evaluation, Goals: Monitor oral intake and follow as needed for weight changes or nutrition impact symptoms.  No follow up scheduled. Patient encouraged to contact RD for concerns/questions and has contact information.

## 2024-02-16 NOTE — Progress Notes (Signed)
    DATE:  02/16/24                                        X CHEMO/IMMUNOTHERAPY REACTION         MD: Odean   AGENT/BLOOD PRODUCT RECEIVING TODAY:              Enhertu    AGENT/BLOOD PRODUCT RECEIVING IMMEDIATELY PRIOR TO REACTION:          Enhertu     Vitals:   02/16/24 1440 02/16/24 1447  BP: 98/60 122/71  Pulse: 69 71  Resp: 18 18  SpO2: 100% 100%      REACTION(S):           headache   PREMEDS:      Decadron  10 mg IV,  Emend 150 mg IV, Aloxi  0.25 mg IV  INTERVENTION: Pepcid  20 mg IV, tylenol  650 mg PO   Review of Systems  Review of Systems  Neurological:  Positive for headaches.  All other systems reviewed and are negative.    Physical Exam  Physical Exam Vitals and nursing note reviewed.  Constitutional:      Appearance: She is not ill-appearing or toxic-appearing.  HENT:     Head: Normocephalic.  Eyes:     Conjunctiva/sclera: Conjunctivae normal.  Cardiovascular:     Rate and Rhythm: Normal rate and regular rhythm.     Pulses: Normal pulses.     Heart sounds: Normal heart sounds.  Pulmonary:     Effort: Pulmonary effort is normal.     Breath sounds: Normal breath sounds.  Abdominal:     General: There is no distension.  Musculoskeletal:     Cervical back: Normal range of motion.  Skin:    General: Skin is warm and dry.  Neurological:     Mental Status: She is alert and oriented to person, place, and time.     Comments: Speech is clear and goal oriented, follows commands CN III-XII intact, no facial droop Normal strength in upper and lower extremities bilaterally  Moves extremities without ataxia       OUTCOME:                  Patient became symptomatic approximately halfway into first time Enhertu  infusion. She refused tylenol  and benadryl  pre medications today. Emergency medications were administered as documented above. Patient returned to baseline. Oncologist notified and agrees to resume treatment. Patient tolerated remainder of  treatment.

## 2024-02-17 ENCOUNTER — Telehealth: Payer: Self-pay | Admitting: *Deleted

## 2024-02-17 ENCOUNTER — Ambulatory Visit (HOSPITAL_COMMUNITY)
Admission: RE | Admit: 2024-02-17 | Discharge: 2024-02-17 | Disposition: A | Discharge status: Home / Self Care | Payer: MEDICAID | Source: Ambulatory Visit | Attending: *Deleted | Admitting: *Deleted

## 2024-02-17 ENCOUNTER — Encounter: Payer: Self-pay | Admitting: Hematology and Oncology

## 2024-02-17 ENCOUNTER — Other Ambulatory Visit (HOSPITAL_COMMUNITY): Payer: Self-pay

## 2024-02-17 ENCOUNTER — Ambulatory Visit (HOSPITAL_BASED_OUTPATIENT_CLINIC_OR_DEPARTMENT_OTHER)
Admission: RE | Admit: 2024-02-17 | Discharge: 2024-02-17 | Disposition: A | Discharge status: Home / Self Care | Payer: MEDICAID | Source: Ambulatory Visit | Attending: Hematology and Oncology | Admitting: Hematology and Oncology

## 2024-02-17 DIAGNOSIS — L24B1 Irritant contact dermatitis related to digestive stoma or fistula: Secondary | ICD-10-CM | POA: Diagnosis not present

## 2024-02-17 DIAGNOSIS — C50911 Malignant neoplasm of unspecified site of right female breast: Secondary | ICD-10-CM | POA: Insufficient documentation

## 2024-02-17 DIAGNOSIS — Z79899 Other long term (current) drug therapy: Secondary | ICD-10-CM

## 2024-02-17 DIAGNOSIS — I471 Supraventricular tachycardia, unspecified: Secondary | ICD-10-CM | POA: Insufficient documentation

## 2024-02-17 DIAGNOSIS — C7951 Secondary malignant neoplasm of bone: Secondary | ICD-10-CM | POA: Diagnosis not present

## 2024-02-17 DIAGNOSIS — K9423 Gastrostomy malfunction: Secondary | ICD-10-CM | POA: Diagnosis not present

## 2024-02-17 DIAGNOSIS — Z5181 Encounter for therapeutic drug level monitoring: Secondary | ICD-10-CM | POA: Insufficient documentation

## 2024-02-17 DIAGNOSIS — I361 Nonrheumatic tricuspid (valve) insufficiency: Secondary | ICD-10-CM

## 2024-02-17 DIAGNOSIS — E785 Hyperlipidemia, unspecified: Secondary | ICD-10-CM | POA: Insufficient documentation

## 2024-02-17 DIAGNOSIS — J45909 Unspecified asthma, uncomplicated: Secondary | ICD-10-CM | POA: Insufficient documentation

## 2024-02-17 DIAGNOSIS — I10 Essential (primary) hypertension: Secondary | ICD-10-CM | POA: Diagnosis not present

## 2024-02-17 LAB — ECHOCARDIOGRAM COMPLETE: S' Lateral: 3.07 cm

## 2024-02-17 NOTE — Progress Notes (Signed)
 [facility] Cancer Follow up:    Chelsea Rosaline SQUIBB, NP [id number] [place] [address]   DIAGNOSIS:  Cancer Staging  Breast cancer [facility]) Staging form: Breast, AJCC 8th Edition - Clinical stage from 12/17/2020: Stage IA (cT1b, cN0, cM0, G2, ER+, PR+, HER2-) - Signed by [name], NP on 01/14/2022 Stage prefix: Initial diagnosis Histologic grading system: 3 grade system Laterality: Left Staged by: Pathologist and managing physician Stage used in treatment planning: Yes National guidelines used in treatment planning: Yes Type of national guideline used in treatment planning: NCCN - Pathologic stage from 07/09/2021: ypTis (DCIS), ypN0, cM0 - Signed by [name], NP on 07/22/2021 Stage prefix: Post-therapy  Malignant neoplasm of upper-outer quadrant of right breast in female, estrogen receptor positive (HCC) Staging form: Breast, AJCC 8th Edition - Clinical stage from 12/17/2020: Stage IIA (cT2, cN1, cM0, G2, ER+, PR+, HER2-) - Signed by [name], NP on 01/14/2022 Stage prefix: Initial diagnosis Histologic grading system: 3 grade system Laterality: Right Staged by: Pathologist and managing physician Stage used in treatment planning: Yes National guidelines used in treatment planning: Yes Type of national guideline used in treatment planning: NCCN - Pathologic stage from 07/09/2021: ypT2, ypN1a, cM0, G2 - Signed by [name], NP on 07/22/2021 Stage prefix: Post-therapy Histologic grading system: 3 grade system - Pathologic: Stage IV (cM1) - Signed by [name], NP on 12/07/2022    SUMMARY OF ONCOLOGIC HISTORY: Oncology History  Malignant neoplasm of upper-outer quadrant of right breast in female, estrogen receptor positive (HCC)  12/12/2020 Initial Diagnosis   status post bilateral breast biopsies 12/03/2020, showing             (1) on the right, a clinical T2 N1, stage IIa invasive lobular  carcinoma, grade 2, estrogen and progesterone receptor strongly positive, HER2 not amplified, with an MIB-1 of 40%                         (a) the biopsied right axillary lymph node was positive with extracapsular extension                         (b) a second right breast mass also biopsied was a fibroadenoma, concordant  MAMMAPRINT tested on biopsy returned high risk, luminal type B, indicating significant benefit from chemo                         (c) biopsy of an area of non-mass-like enhancement in the upper right breast pending   12/17/2020 Cancer Staging   Staging form: Breast, AJCC 8th Edition - Clinical stage from 12/17/2020: Stage IIA (cT2, cN1, cM0, G2, ER+, PR+, HER2-) - Signed by [name], NP on 01/14/2022 Stage prefix: Initial diagnosis Histologic grading system: 3 grade system Laterality: Right Staged by: Pathologist and managing physician Stage used in treatment planning: Yes National guidelines used in treatment planning: Yes Type of national guideline used in treatment planning: NCCN   12/24/2020 Genetic Testing   Negative genetic testing:  No pathogenic variants detected on the [employer] BRCAplus panel (report date 12/24/2020) or the CancerNext-Expanded + RNAinsight panel (report date 12/31/2020). 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   The BRCAplus panel offered by [employer] and includes sequencing and deletion/duplication analysis for the following 8 genes: ATM, BRCA1, BRCA2, CDH1, CHEK2, PALB2, PTEN,  and TP53. The CancerNext-Expanded + RNAinsight gene panel offered by [employer] and includes sequencing and rearrangement analysis for the following 77 genes: AIP, ALK, APC, ATM, AXIN2, BAP1, BARD1, BLM, BMPR1A, BRCA1, BRCA2, BRIP1, CDC73, CDH1, CDK4, CDKN1B, CDKN2A, CHEK2, CTNNA1, DICER1, FANCC, FH, FLCN, GALNT12, KIF1B, LZTR1, MAX, MEN1, MET, MLH1, MSH2, MSH3, MSH6, MUTYH, NBN, NF1, NF2, NTHL1, PALB2,  PHOX2B, PMS2, POT1, PRKAR1A, PTCH1, PTEN, RAD51C, RAD51D, RB1, RECQL, RET, SDHA, SDHAF2, SDHB, SDHC, SDHD, SMAD4, SMARCA4, SMARCB1, SMARCE1, STK11, SUFU, TMEM127, TP53, TSC1, TSC2, VHL and XRCC2 (sequencing and deletion/duplication); EGFR, EGLN1, HOXB13, KIT, MITF, PDGFRA, POLD1 and POLE (sequencing only); EPCAM and GREM1 (deletion/duplication only). RNA data is routinely analyzed for use in variant interpretation for all genes.   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 Multifocal sclerotic bony metastatic disease greatest in the thoracic spine with scattered rib lesions and sclerosis as well. 2. No nodal or visceral metastasis about the chest. 3. Collateral pathways about the chest suggest some subclavian venous narrowing on the LEFT. This could also be related to arm position. Correlate clinically. 4. Signs of RIGHT breast lumpectomy. Potential postoperative changes in the LEFT breast with small focal area of nodularity in the lateral LEFT breast  which could also be postoperative, correlate clinically and consider mammographic correlation as warranted. 5. Mild cardiac enlargement.   05/18/2022 Treatment Plan Change   Anastrozole  + Verzenio  100mg  PO BID   12/07/2022 Cancer Staging   Staging form: Breast, AJCC 8th Edition - Pathologic: Stage IV (cM1) - Signed by [name], NP on 12/07/2022   02/16/2024 -  Chemotherapy   Patient is on Treatment Plan : BREAST Fam-Trastuzumab Deruxtecan-nxki  (Enhertu ) (5.4) q21d     Breast cancer (HCC)  12/12/2020 Initial Diagnosis   on the left, a clinical T1b N0, stage IA invasive ductal carcinoma, grade 1 or 2, estrogen and progesterone receptor positive, HER2 not amplified, with an MIB 1 of 15%   12/17/2020 Cancer Staging   Staging form: Breast, AJCC 8th Edition - Clinical stage from 12/17/2020: Stage IA (cT1b, cN0, cM0, G2, ER+, PR+, HER2-) - Signed by [name], NP on 01/14/2022 Stage prefix: Initial diagnosis Histologic grading system: 3 grade system Laterality: Left Staged by: Pathologist and managing physician Stage used in treatment planning: Yes National guidelines used in treatment planning: Yes Type of national guideline used in treatment planning: NCCN   12/24/2020 Genetic Testing   Negative genetic testing:  No pathogenic variants detected on the [employer] BRCAplus panel (report date 12/24/2020) or the CancerNext-Expanded + RNAinsight panel (report date 12/31/2020). A variant of  uncertain significance (VUS) was detected in the ATM gene called p.D44G (c.131A>G).   The BRCAplus panel offered by [employer] and includes sequencing and deletion/duplication analysis for the following 8 genes: ATM, BRCA1, BRCA2, CDH1, CHEK2, PALB2, PTEN, and TP53. The CancerNext-Expanded + RNAinsight gene panel offered by [employer] and includes sequencing and rearrangement analysis for the following 77 genes: AIP, ALK, APC, ATM, AXIN2, BAP1, BARD1, BLM, BMPR1A, BRCA1, BRCA2, BRIP1,  CDC73, CDH1, CDK4, CDKN1B, CDKN2A, CHEK2, CTNNA1, DICER1, FANCC, FH, FLCN, GALNT12, KIF1B, LZTR1, MAX, MEN1, MET, MLH1, MSH2, MSH3, MSH6, MUTYH, NBN, NF1, NF2, NTHL1, PALB2, PHOX2B, PMS2, POT1, PRKAR1A, PTCH1, PTEN, RAD51C, RAD51D, RB1, RECQL, RET, SDHA, SDHAF2, SDHB, SDHC, SDHD, SMAD4, SMARCA4, SMARCB1, SMARCE1, STK11, SUFU, TMEM127, TP53, TSC1, TSC2, VHL and XRCC2 (sequencing and deletion/duplication); EGFR, EGLN1, HOXB13, KIT, MITF, PDGFRA, POLD1 and POLE (sequencing only); EPCAM and GREM1 (deletion/duplication only). RNA data is routinely analyzed for use in variant interpretation for all genes.   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 Multifocal sclerotic bony metastatic disease greatest in the thoracic spine with scattered rib lesions and sclerosis as well. 2. No nodal or visceral metastasis about the chest. 3. Collateral pathways about the chest suggest some subclavian venous narrowing on the LEFT. This could also be related to arm position. Correlate clinically. 4. Signs of RIGHT  breast lumpectomy. Potential postoperative changes in the LEFT breast with small focal area of nodularity in the lateral LEFT breast which could also be postoperative, correlate clinically and consider mammographic correlation as warranted. 5. Mild cardiac enlargement.   05/18/2022 Treatment Plan Change   Anastrozole  + Verzenio  100mg  PO BID   06/23/2022 - 07/07/2022 Radiation Therapy   06/23/2022 through 07/07/2022 Site Technique Total Dose (Gy) Dose per Fx (Gy) Completed Fx Beam Energies  Lumbar Spine: Spine_LS_Pelv 3D 30/30 3 10/10 15X       CURRENT THERAPY: enhertu   INTERVAL HISTORY:  Discussed the use of AI scribe software for clinical note transcription with the patient, who gave verbal consent to proceed.  History of Present Illness Chelsea Hansen is a 53 year old female with metastatic breast cancer who presents for follow-up.  She is undergoing treatment for metastatic breast cancer with metastasis to the pancreas and peritoneum. The cancer is ER 100% positive and HER2 2+. She receives HER2-targeted chemotherapy every three weeks, with the next session scheduled for September 17th. Initial PET scans were negative, but subsequent symptoms of nausea and vomiting led to the discovery of the metastatic spread.  Nausea and vomiting have improved, with the last episode occurring two weeks ago after overeating. She is managing  low potassium levels.    Patient Active Problem List   Diagnosis Date Noted   Gastrostomy tube present (HCC) 02/10/2024   Gastrostomy site leak (HCC) 02/10/2024   Irritant contact dermatitis associated with digestive stoma 02/10/2024   Protein-calorie malnutrition, severe 02/03/2024   SBO (small bowel obstruction) (HCC) 02/01/2024   Generalized abdominal pain 01/17/2024   Elevated CA 19-9 level 01/16/2024   Hypophosphatemia 01/04/2024   Hypomagnesemia 01/04/2024   Pancreatic lesion 12/29/2023   Unintentional weight loss 12/29/2023   Nausea and vomiting  12/29/2023   Duodenal obstruction 12/29/2023   Abnormal MRI of abdomen 12/26/2023   Abnormal CT of the abdomen 12/23/2023   Elevated liver enzymes 12/23/2023   Acute kidney injury (HCC) 12/23/2023   AKI (acute kidney injury) (HCC) 12/22/2023   Transaminitis 12/22/2023   Pancreatic mass 12/22/2023   Right sided weakness 12/22/2023   Hypotension 12/22/2023   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   Abnormal CT scan, colon 12/14/2023   Acute esophagitis 12/14/2023   Candida esophagitis (HCC) 12/14/2023   Therapeutic opioid-induced constipation (OIC) 12/11/2023   Hypocalcemia 12/10/2023   Fever of unknown origin 12/10/2023   Sinus tachycardia 12/10/2023   Acute kidney injury superimposed on chronic kidney disease (HCC) 12/05/2023   Electrolyte abnormality 12/05/2023   Dehydration 12/05/2023   Prolonged QT interval 12/05/2023   Intractable nausea and vomiting 11/24/2023   Neoplasm related pain 12/07/2022   Constipation 06/30/2022   Nausea and vomiting in adult 06/29/2022   Hematemesis 06/28/2022   Breast cancer metastasized to bone, right (HCC) 05/20/2022   Supraventricular tachycardia (HCC) 07/08/2021   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   GERD (gastroesophageal reflux disease) 05/27/2021   HLD (hyperlipidemia) 05/27/2021   Essential hypertension 05/27/2021   Pain management contract signed 05/04/2021   Chronic pain syndrome (breast cancer) 05/04/2021   Palpitations 03/25/2021   Sinus bradycardia 03/25/2021   Daytime somnolence 03/25/2021   Snoring 03/25/2021   Obesity (BMI 30-39.9) 03/25/2021   Port-A-Cath in place 01/27/2021   Genetic testing 12/24/2020   Family history of uterine cancer 12/17/2020   Malignant neoplasm of upper-outer quadrant of right breast in female, estrogen receptor positive (HCC) 12/12/2020   Breast cancer (HCC) 12/12/2020   Hypokalemia 01/02/2012   Chronic asthma 01/01/2012   Bradycardia 01/01/2012   Syncope 12/31/2011   Mediastinal adenopathy  12/31/2011   Anemia 12/31/2011   Chest pain 12/31/2011    is allergic to enhertu  [fam-trastuzumab  deruxtec-nxki].  MEDICAL HISTORY: Past Medical History:  Diagnosis Date   Allergy    seasonal allergies   Anxiety    on meds   Asthma    uses inhaler   Breast cancer (HCC) 2012   RIGHT lumpectomy   Cancer (HCC) 2022   RIGHT breast lump-dx 2022   Depression    on meds   DVT (deep venous thrombosis) (HCC) 2010   after hysterectomy   Family history of uterine cancer    GERD (gastroesophageal reflux disease)    with certain foods/OTC PRN meds   Headache(784.0)    History of radiation therapy    Bilateral breast- 09/03/21-11/02/21- Dr. Lynwood Nasuti   Hyperlipidemia    on meds   Hypertension    on meds   SVT (supraventricular tachycardia) (HCC)     SURGICAL HISTORY: Past Surgical History:  Procedure Laterality Date   ABDOMINAL HYSTERECTOMY     AXILLARY SENTINEL NODE BIOPSY Left 07/09/2021   Procedure: LEFT AXILLARY SENTINEL NODE BIOPSY;  Surgeon: [name], MD;  Location: [facility];  Service: General;  Laterality: Left;   BALLOON DILATION N/A 12/13/2023   Procedure: BALLOON DILATION;  Surgeon: Federico Rosario BROCKS, MD;  Location: [facility];  Service: Gastroenterology;  Laterality: N/A;   BIOPSY  06/29/2022   Procedure: BIOPSY;  Surgeon: Legrand Victory LITTIE DOUGLAS, MD;  Location: [facility];  Service: Gastroenterology;;   BIOPSY OF SKIN SUBCUTANEOUS TISSUE AND/OR MUCOUS MEMBRANE  12/13/2023   Procedure: BIOPSY, SKIN, SUBCUTANEOUS TISSUE, OR MUCOUS MEMBRANE;  Surgeon: Federico Rosario BROCKS, MD;  Location: [facility];  Service: Gastroenterology;;   BREAST EXCISIONAL BIOPSY Right 09/16/2009   BREAST LUMPECTOMY WITH RADIOACTIVE SEED LOCALIZATION Bilateral 07/09/2021   Procedure: BILATERAL BREAST LUMPECTOMY WITH RADIOACTIVE SEED LOCALIZATION;  Surgeon: [name], MD;  Location: [facility];  Service: General;  Laterality: Bilateral;   BREAST SURGERY      lumpectomy   ESOPHAGOGASTRODUODENOSCOPY N/A 12/13/2023   Procedure: EGD (ESOPHAGOGASTRODUODENOSCOPY);  Surgeon: Federico Rosario BROCKS, MD;  Location: [facility];  Service: Gastroenterology;  Laterality: N/A;   ESOPHAGOGASTRODUODENOSCOPY N/A 12/26/2023   Procedure: EGD (ESOPHAGOGASTRODUODENOSCOPY);  Surgeon: Wilhelmenia Aloha Raddle., MD;  Location: [facility];  Service: Gastroenterology;  Laterality: N/A;   ESOPHAGOGASTRODUODENOSCOPY N/A 12/27/2023   Procedure: EGD (ESOPHAGOGASTRODUODENOSCOPY);  Surgeon: Wilhelmenia Aloha Raddle., MD;  Location: [facility];  Service: Gastroenterology;  Laterality: N/A;   ESOPHAGOGASTRODUODENOSCOPY (EGD) WITH PROPOFOL  N/A 06/29/2022   Procedure: ESOPHAGOGASTRODUODENOSCOPY (EGD) WITH PROPOFOL ;  Surgeon: Legrand Victory LITTIE DOUGLAS, MD;  Location: [facility];  Service: Gastroenterology;  Laterality: N/A;   EUS N/A 12/26/2023   Procedure: ULTRASOUND, UPPER GI TRACT, ENDOSCOPIC;  Surgeon: Wilhelmenia Aloha Raddle., MD;  Location: [facility];  Service: Gastroenterology;  Laterality: N/A;   EUS N/A 12/27/2023   Procedure: ULTRASOUND, UPPER GI TRACT, ENDOSCOPIC;  Surgeon: Wilhelmenia Aloha Raddle., MD;  Location: [facility];  Service: Gastroenterology;  Laterality: N/A;   IR CM INJ ANY COLONIC TUBE W/FLUORO  01/20/2024   IR IMAGING GUIDED PORT INSERTION  02/06/2024   IR REPLC DUODEN/JEJUNO TUBE PERCUT W/FLUORO  02/03/2024   LAPAROSCOPIC INSERTION GASTROSTOMY TUBE N/A 01/10/2024   Procedure: laparoscopic insertion of gastrostomy tube, laparoscopic insertion of jejunostomy tube, upper endoscopy;  Surgeon: Kinsinger, Herlene Righter, MD;  Location: [facility];  Service: General;  Laterality: N/A;  Laproscopic insertion   LAPAROSCOPIC ROUX-EN-Y GASTRIC BYPASS WITH HIATAL HERNIA REPAIR N/A 01/02/2024   Procedure: LAPAROSCOPIC CREATION GASTRJEJONOSTOMY;  Surgeon: Lyndel Deward PARAS, MD;  Location: [facility];  Service: General;  Laterality: N/A;  GASTROJEJUNOSTOMY   PORT-A-CATH REMOVAL Left 07/09/2021    Procedure: REMOVAL PORT-A-CATH;  Surgeon: [name], MD;  Location: [facility];  Service: General;  Laterality: Left;   PORTACATH PLACEMENT Left 01/26/2021   Procedure: INSERTION PORT-A-CATH;  Surgeon: [name], MD;  Location: [facility];  Service: General;  Laterality: Left;   RADIOACTIVE SEED GUIDED AXILLARY SENTINEL LYMPH NODE Right 07/09/2021   Procedure: RADIOACTIVE SEED GUIDED RIGHT AXILLARY SENTINEL LYMPH NODE DISSECTION;  Surgeon: [name], MD;  Location:  SURGERY CENTER;  Service: General;  Laterality: Right;   TUBAL LIGATION      SOCIAL HISTORY: Social History   Socioeconomic History   Marital status: Single    Spouse name: Not on file   Number of children: Not on file   Years of education: Not on file   Highest education level: Not on file  Occupational History   Not on file  Tobacco Use   Smoking status: Never   Smokeless tobacco: Never  Vaping Use   Vaping status: Never Used  Substance and Sexual Activity   Alcohol use: Not Currently    Alcohol/week: 7.0 standard drinks of alcohol    Types: 7 Standard drinks or equivalent per week    Comment: occassional   Drug use: No   Sexual activity: Not Currently  Other Topics Concern   Not on file  Social History Narrative   Not on file   Social Drivers of Health   Financial Resource Strain: High Risk (02/13/2024)   Overall Financial Resource Strain (CARDIA)    Difficulty of Paying Living Expenses: Hard  Food Insecurity: Food Insecurity Present (02/16/2024)   Hunger Vital Sign    Worried About Running Out of Food in the Last Year: Sometimes true    Ran Out of Food in the Last Year: Sometimes true  Transportation Needs: No Transportation Needs (02/13/2024)   PRAPARE - Administrator, Civil Service (Medical): No    Lack of Transportation (Non-Medical): No  Physical Activity: Inactive (02/13/2024)   Exercise Vital Sign    Days of Exercise per Week: 0 days     Minutes of Exercise per Session: 0 min  Stress: No Stress Concern Present (02/13/2024)   [employer] of Occupational Health - Occupational Stress Questionnaire    Feeling of Stress: Not at all  Social Connections: Moderately Isolated (02/13/2024)   Social Connection and Isolation Panel    Frequency of Communication with Friends and Family: More than three times a week    Frequency of Social Gatherings with Friends and Family: Once a week    Attends Religious Services: 1 to 4 times per year    Active Member of [employer] or Organizations: No    Attends Banker Meetings: Never    Marital Status: Never married  Intimate Partner Violence: Not At Risk (02/13/2024)   Humiliation, Afraid, Rape, and Kick questionnaire    Fear of Current or Ex-Partner: No    Emotionally Abused: No    Physically Abused: No    Sexually Abused: No    FAMILY HISTORY: Family History  Problem Relation Age of Onset   Breast cancer Mother        69s   Hypertension Father    Uterine cancer Maternal Aunt    Ovarian cancer Maternal Aunt    Breast cancer Maternal Aunt        6s   Breast cancer Cousin 53   Uterine cancer Cousin 50   Colon polyps Neg Hx    Colon cancer Neg Hx    Esophageal cancer Neg Hx    Stomach cancer Neg Hx     Review of Systems  Constitutional:  Positive for fatigue. Negative for appetite change, chills, fever and unexpected weight change.  HENT:   Negative for hearing loss, lump/mass and trouble swallowing.   Eyes:  Negative for eye problems and icterus.  Respiratory:  Negative for chest tightness, cough and shortness of breath.   Cardiovascular:  Negative for chest pain, leg swelling and palpitations.  Gastrointestinal:  Negative for abdominal distention, abdominal pain, constipation, diarrhea, nausea and vomiting.  Endocrine: Negative for hot flashes.  Genitourinary:  Negative for difficulty urinating.   Musculoskeletal:  Negative for arthralgias.  Skin:  Negative for  itching and rash.  Neurological:  Negative for dizziness, extremity weakness, headaches and numbness.  Hematological:  Negative for adenopathy. Does not bruise/bleed easily.  Psychiatric/Behavioral:  Negative for depression. The patient is not nervous/anxious.       PHYSICAL EXAMINATION   Onc Performance Status -  02/16/24 1100       ECOG Perf Status   ECOG Perf Status Restricted in physically strenuous activity but ambulatory and able to carry out work of a light or sedentary nature, e.g., light house work, office work      KPS SCALE   KPS % SCORE Normal activity with effort, some s/s of disease          Vitals:   02/16/24 1054  BP: 100/79  Pulse: 98  Resp: 17  Temp: 98.6 F (37 C)  SpO2: 100%    Physical Exam Constitutional:      General: She is not in acute distress.    Appearance: Normal appearance. She is not toxic-appearing.  HENT:     Head: Normocephalic and atraumatic.     Mouth/Throat:     Mouth: Mucous membranes are moist.     Pharynx: Oropharynx is clear. No oropharyngeal exudate or posterior oropharyngeal erythema.  Eyes:     General: No scleral icterus. Cardiovascular:     Rate and Rhythm: Normal rate and regular rhythm.     Pulses: Normal pulses.     Heart sounds: Normal heart sounds.  Pulmonary:     Effort: Pulmonary effort is normal.     Breath sounds: Normal breath sounds.  Abdominal:     General: Abdomen is flat. Bowel sounds are normal. There is no distension.     Palpations: Abdomen is soft.     Tenderness: There is no abdominal tenderness.  Musculoskeletal:        General: No swelling.     Cervical back: Neck supple.  Lymphadenopathy:     Cervical: No cervical adenopathy.  Skin:    General: Skin is warm and dry.     Findings: No rash.  Neurological:     General: No focal deficit present.     Mental Status: She is alert.  Psychiatric:        Mood and Affect: Mood normal.        Behavior: Behavior normal.     LABORATORY  DATA:  CBC    Component Value Date/Time   WBC 3.7 (L) 02/16/2024 1034   WBC 5.4 02/04/2024 1129   RBC 3.22 (L) 02/16/2024 1034   HGB 8.3 (L) 02/16/2024 1034   HGB 11.5 10/06/2020 1432   HCT 24.6 (L) 02/16/2024 1034   HCT 36.8 10/06/2020 1432   PLT 127 (L) 02/16/2024 1034   PLT 323 10/06/2020 1432   MCV 76.4 (L) 02/16/2024 1034   MCV 73 (L) 10/06/2020 1432   MCH 25.8 (L) 02/16/2024 1034   MCHC 33.7 02/16/2024 1034   RDW 20.7 (H) 02/16/2024 1034   RDW 15.9 (H) 10/06/2020 1432   LYMPHSABS 1.3 02/16/2024 1034   LYMPHSABS 1.9 10/06/2020 1432   MONOABS 0.4 02/16/2024 1034   EOSABS 0.0 02/16/2024 1034   EOSABS 0.1 10/06/2020 1432   BASOSABS 0.0 02/16/2024 1034   BASOSABS 0.0 10/06/2020 1432    CMP     Component Value Date/Time   NA 138 02/16/2024 1034   NA 144 11/30/2023 1000   K 3.1 (L) 02/16/2024 1034   CL 105 02/16/2024 1034   CO2 26 02/16/2024 1034   GLUCOSE 101 (H) 02/16/2024 1034   BUN 6 02/16/2024 1034   BUN 19 11/30/2023 1000   CREATININE 0.42 (L) 02/16/2024 1034   CALCIUM  7.7 (L) 02/16/2024 1034   PROT 6.1 (L) 02/16/2024 1034   PROT 7.3 11/30/2023 1000   ALBUMIN  3.1 (L) 02/16/2024 1034  ALBUMIN  4.5 11/30/2023 1000   AST 39 02/16/2024 1034   ALT 38 02/16/2024 1034   ALKPHOS 1,046 (H) 02/16/2024 1034   BILITOT 0.5 02/16/2024 1034   GFRNONAA >60 02/16/2024 1034   GFRAA >60 09/07/2019 1340     ASSESSMENT and THERAPY PLAN:   Malignant neoplasm of upper-outer quadrant of right breast in female, estrogen receptor positive (HCC) 12/03/2020: Bilateral breast biopsies: Right breast: T2N1 stage IIa grade 2 ILC, ER/PR positive, HER2 negative, Ki-67 40%, right axillary lymph node positive with extracapsular extension MammaPrint: High risk   01/27/2021 -06/08/2021 neoadjuvant chemotherapy with dose dense Adriamycin  and Cytoxan  x4 followed by Taxol  x11   07/09/2021:Right lumpectomy: Grade 2 invasive lobular cancer, 2.5 cm, LCIS, margins negative, LCIS focally at  anterior margin, 1/1 lymph node positive, ER 95%, PR 100%, HER2 negative, Ki-67 40% 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  Hospitalization: 12/22/2023-01/17/2024: Intractable nausea vomiting (PEG tube: Venting gastrostomy, J-tube) Hospitalization 02/01/2024-02/06/2024: Intractable pain, nausea vomiting ---------------------------------------------------------------------------------------------------------------------- 06/10/2023: PET/CT: Interval development of hypermetabolic lymphadenopathy left submandibular region, throughout mediastinal, bilateral hilar, upper abdomen (no uptake in the bones)  02/02/2024: CT abdomen and pelvis: Pancreatic mass, right hydronephrosis, peritoneal carcinomatosis, bone metastases L3 Peritoneal biopsy 02/03/2024: Metastatic breast cancer, ER +100%, HER-2 2+  Assessment and Plan Assessment & Plan Metastatic ER-positive, HER2-positive breast cancer Metastatic breast cancer with ER 100% positive and HER2 2+ status. Recent findings include pancreatic mass and peritoneal involvement. - Proceed with Enhertu  today and every three weeks - Continue Xgeva , but wait until 03/07/2024 to optimize calcium  level prior to receiving.   - OK per Dr. Odean for patient to proceed without echo as she is asymptomatic.  Echo scheduled for tomorrow.    Hypokalemia Low potassium levels noted. Two doses of IV potassium planned due to time constraints. - Administer 20meq of Kcl today during Enhertu  therapy  Hypocalcemia (monitoring and risk with Xgeva ) Calcium  levels need improvement before Xgeva  due to risk of further lowering calcium . - Monitor calcium  levels before administering Xgeva .  RTC in 3 weeks for labs, f/u, and her next treatment.      All questions were answered.  The patient knows to call the clinic with any problems, questions or concerns. We can certainly see the patient much sooner if necessary.  Total encounter time:30 minutes*in face-to-face visit time, chart review, lab review, care coordination, order entry, and documentation of the encounter time.    Morna Kendall, NP 02/17/24 3:19 PM Medical Oncology and Hematology [facility] [address] [place], [place] [address] Tel. [phone]    Fax. [phone]  *Total Encounter Time as defined by the Centers for Medicare and Medicaid Services includes, in addition to the face-to-face time of a patient visit (documented in the note above) non-face-to-face time: obtaining and reviewing outside history, ordering and reviewing medications, tests or procedures, care coordination (communications with other health care professionals or caregivers) and documentation in the medical record.

## 2024-02-17 NOTE — Assessment & Plan Note (Signed)
 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  Hospitalization: 12/22/2023-01/17/2024: Intractable nausea vomiting (PEG tube: Venting gastrostomy, J-tube) Hospitalization 02/01/2024-02/06/2024: Intractable pain, nausea vomiting ---------------------------------------------------------------------------------------------------------------------- 06/10/2023: PET/CT: Interval development of hypermetabolic lymphadenopathy left submandibular region, throughout mediastinal, bilateral hilar, upper abdomen (no uptake in the bones)  02/02/2024: CT abdomen and pelvis: Pancreatic mass, right hydronephrosis, peritoneal carcinomatosis, bone metastases L3 Peritoneal biopsy 02/03/2024: Metastatic breast cancer, ER +100%, HER-2 2+  Assessment and Plan Assessment & Plan Metastatic ER-positive, HER2-positive breast cancer Metastatic breast cancer with ER 100% positive and HER2 2+ status. Recent findings include pancreatic mass and peritoneal involvement. - Proceed with Enhertu  today and every three weeks - Continue Xgeva , but wait until 03/07/2024 to optimize calcium  level prior to receiving.   - OK per Dr. Odean for patient to proceed without echo as she is asymptomatic.  Echo scheduled for tomorrow.     Hypokalemia Low potassium levels noted. Two doses of IV potassium planned due to time constraints. - Administer 20meq of Kcl today during Enhertu  therapy  Hypocalcemia (monitoring and risk with Xgeva ) Calcium  levels need improvement before Xgeva  due to risk of further lowering calcium . - Monitor calcium  levels before administering Xgeva .  RTC in 3 weeks for labs, f/u, and her next treatment.

## 2024-02-17 NOTE — Telephone Encounter (Signed)
 Called pt to check on since treatment done yesterday.  She had some issues yesterday with Enhertu  but doing OK today.  She had some nausea & med helped.  Discussed upcoming appts & she states that 02/28/24 appts are supposed to be moved to 9/17. There are appts for 9/17.  Will verify with Morna.  She knows how to reach us  if needed.

## 2024-02-17 NOTE — Progress Notes (Signed)
 [facility]   Reason for visit:  LMQ feeding tube site, continues to drain.  Peristomal irritation HPI:  Feeding tube Past Medical History:  Diagnosis Date   Allergy    seasonal allergies   Anxiety    on meds   Asthma    uses inhaler   Breast cancer (HCC) 2012   RIGHT lumpectomy   Cancer (HCC) 2022   RIGHT breast lump-dx 2022   Depression    on meds   DVT (deep venous thrombosis) (HCC) 2010   after hysterectomy   Family history of uterine cancer    GERD (gastroesophageal reflux disease)    with certain foods/OTC PRN meds   Headache(784.0)    History of radiation therapy    Bilateral breast- 09/03/21-11/02/21- Dr. Lynwood Nasuti   Hyperlipidemia    on meds   Hypertension    on meds   SVT (supraventricular tachycardia) (HCC)    Family History  Problem Relation Age of Onset   Breast cancer Mother        19s   Hypertension Father    Uterine cancer Maternal Aunt    Ovarian cancer Maternal Aunt    Breast cancer Maternal Aunt        35s   Breast cancer Cousin 59   Uterine cancer Cousin 50   Colon polyps Neg Hx    Colon cancer Neg Hx    Esophageal cancer Neg Hx    Stomach cancer Neg Hx    Allergies  Allergen Reactions   Enhertu  [Fam-Trastuzumab  Deruxtec-Nxki] Other (See Comments)    Headache and chest pain. See progress note from 02/16/2024. Patient able to complete infusion.   Current Outpatient Medications  Medication Sig Dispense Refill Last Dose/Taking   ciprofloxacin  (CIPRO ) 500 MG tablet Take 1 tablet (500 mg total) by mouth 2 (two) times daily. 14 tablet 0    dexamethasone  (DECADRON ) 4 MG tablet Take 2 tablets (8 mg) by mouth daily for 3 days starting the day after chemotherapy. Take with food. 30 tablet 1    fentaNYL  (DURAGESIC ) 12 MCG/HR Place 1 patch onto the skin every 3 (three) days. 5 patch 0    lidocaine -prilocaine  (EMLA ) cream Apply to affected area once 30 g 3    Multiple Vitamin (MULTIVITAMIN WITH MINERALS) TABS tablet Take 1 tablet by  mouth daily.      ondansetron  (ZOFRAN ) 8 MG tablet Take 1 tablet (8 mg total) by mouth every 8 (eight) hours as needed for nausea or vomiting. Start on the third day after chemotherapy. 30 tablet 1    oxyCODONE  (ROXICODONE ) 15 MG immediate release tablet Take 1 tablet (15 mg total) by mouth every 4 (four) hours as needed for pain. 90 tablet 0    pantoprazole  (PROTONIX ) 40 MG tablet Take 1 tablet (40 mg total) by mouth 2 (two) times daily. 60 tablet 0    polyethylene glycol powder (GLYCOLAX /MIRALAX ) 17 GM/SCOOP powder Mix 17 g in 4 oz of liquid and take by mouth 2 (two) times daily as needed. 3350 g 1    prochlorperazine  (COMPAZINE ) 10 MG tablet Take 1 tablet (10 mg total) by mouth every 6 (six) hours as needed for nausea or vomiting. 30 tablet 1    senna (SENOKOT) 8.6 MG TABS tablet Take 2 tablets (17.2 mg total) by mouth 2 (two) times daily. 90 tablet 3    sulfamethoxazole -trimethoprim  (BACTRIM  DS) 800-160 MG tablet Take 1 tablet by mouth 2 (two) times daily. 14 tablet 0    thiamine  (  VITAMIN B1) 100 MG tablet Take 1 tablet (100 mg total) by mouth daily. 30 tablet 0    No current facility-administered medications for this encounter.   ROS  Review of Systems  Constitutional:  Positive for fatigue.  Cardiovascular: Negative.   Gastrointestinal:  Positive for nausea.  Musculoskeletal:  Positive for back pain (metastatic disease) and myalgias.  Skin:  Positive for rash.  Psychiatric/Behavioral:  Positive for dysphoric mood. The patient is nervous/anxious.   All other systems reviewed and are negative.  Vital signs:  BP 102/71   Pulse 83   Temp (!) 97.5 F (36.4 C) (Oral)   Resp 18   SpO2 98%  Exam:  Physical Exam Vitals reviewed.  Cardiovascular:     Rate and Rhythm: Normal rate.  Pulmonary:     Effort: Pulmonary effort is normal.  Abdominal:     Palpations: Abdomen is soft.  Musculoskeletal:     Comments: Back pain  Skin:    General: Skin is warm and dry.     Comments:  Peristomal irritation     Stoma type/location:  LMQ feeding tube site with drainage.  Pouching to protect skin and contain drainage.  Stomal assessment/size:  0.3 cm opening at feeding tube site Peristomal assessment:  skin improved but remains red and tender Treatment options for stomal/peristomal skin: stoma powder and skin prep  Eakin seal and 1 piece flat pouch  Output: creamy effluent Ostomy pouching: 1pc. flat  Education provided:  continue same pouch     Impression/dx  Irritant contact dermatitis Gastrostomy tube with breakdown  Discussion  See back as needed Plan  [facility] clinic with ongoing issues, skin improving.     Visit time: 40 minutes.   Darice Cooley FNP-BC

## 2024-02-22 ENCOUNTER — Inpatient Hospital Stay: Payer: MEDICAID

## 2024-02-22 ENCOUNTER — Inpatient Hospital Stay: Payer: MEDICAID | Attending: Adult Health

## 2024-02-22 ENCOUNTER — Telehealth: Payer: Self-pay | Admitting: *Deleted

## 2024-02-22 ENCOUNTER — Other Ambulatory Visit: Payer: Self-pay

## 2024-02-22 ENCOUNTER — Other Ambulatory Visit: Payer: Self-pay | Admitting: *Deleted

## 2024-02-22 ENCOUNTER — Encounter: Payer: Self-pay | Admitting: Adult Health

## 2024-02-22 ENCOUNTER — Inpatient Hospital Stay (HOSPITAL_BASED_OUTPATIENT_CLINIC_OR_DEPARTMENT_OTHER): Payer: MEDICAID | Admitting: Adult Health

## 2024-02-22 ENCOUNTER — Other Ambulatory Visit (HOSPITAL_COMMUNITY): Payer: Self-pay

## 2024-02-22 VITALS — BP 107/66 | HR 75 | Temp 100.3°F | Resp 18

## 2024-02-22 VITALS — BP 84/55 | HR 107 | Temp 98.2°F | Resp 17 | Wt 164.8 lb

## 2024-02-22 DIAGNOSIS — E876 Hypokalemia: Secondary | ICD-10-CM | POA: Diagnosis not present

## 2024-02-22 DIAGNOSIS — D6481 Anemia due to antineoplastic chemotherapy: Secondary | ICD-10-CM | POA: Insufficient documentation

## 2024-02-22 DIAGNOSIS — D84821 Immunodeficiency due to drugs: Secondary | ICD-10-CM | POA: Diagnosis not present

## 2024-02-22 DIAGNOSIS — C7951 Secondary malignant neoplasm of bone: Secondary | ICD-10-CM | POA: Insufficient documentation

## 2024-02-22 DIAGNOSIS — C50412 Malignant neoplasm of upper-outer quadrant of left female breast: Secondary | ICD-10-CM | POA: Insufficient documentation

## 2024-02-22 DIAGNOSIS — R9431 Abnormal electrocardiogram [ECG] [EKG]: Secondary | ICD-10-CM | POA: Insufficient documentation

## 2024-02-22 DIAGNOSIS — R748 Abnormal levels of other serum enzymes: Secondary | ICD-10-CM | POA: Diagnosis not present

## 2024-02-22 DIAGNOSIS — R197 Diarrhea, unspecified: Secondary | ICD-10-CM | POA: Diagnosis not present

## 2024-02-22 DIAGNOSIS — E860 Dehydration: Secondary | ICD-10-CM | POA: Diagnosis not present

## 2024-02-22 DIAGNOSIS — R509 Fever, unspecified: Secondary | ICD-10-CM

## 2024-02-22 DIAGNOSIS — Z170 Estrogen receptor positive status [ER+]: Secondary | ICD-10-CM | POA: Insufficient documentation

## 2024-02-22 DIAGNOSIS — Z95828 Presence of other vascular implants and grafts: Secondary | ICD-10-CM

## 2024-02-22 DIAGNOSIS — C50411 Malignant neoplasm of upper-outer quadrant of right female breast: Secondary | ICD-10-CM

## 2024-02-22 DIAGNOSIS — K8689 Other specified diseases of pancreas: Secondary | ICD-10-CM

## 2024-02-22 DIAGNOSIS — G620 Drug-induced polyneuropathy: Secondary | ICD-10-CM | POA: Insufficient documentation

## 2024-02-22 DIAGNOSIS — Z5112 Encounter for antineoplastic immunotherapy: Secondary | ICD-10-CM | POA: Insufficient documentation

## 2024-02-22 DIAGNOSIS — R112 Nausea with vomiting, unspecified: Secondary | ICD-10-CM

## 2024-02-22 LAB — URINALYSIS, COMPLETE (UACMP) WITH MICROSCOPIC
Bacteria, UA: NONE SEEN
Bilirubin Urine: NEGATIVE
Glucose, UA: NEGATIVE mg/dL
Hgb urine dipstick: NEGATIVE
Ketones, ur: 5 mg/dL — AB
Leukocytes,Ua: NEGATIVE
Nitrite: NEGATIVE
Protein, ur: NEGATIVE mg/dL
Specific Gravity, Urine: 1.015 (ref 1.005–1.030)
pH: 6 (ref 5.0–8.0)

## 2024-02-22 LAB — CMP (CANCER CENTER ONLY)
ALT: 167 U/L — ABNORMAL HIGH (ref 0–44)
AST: 82 U/L — ABNORMAL HIGH (ref 15–41)
Albumin: 3.2 g/dL — ABNORMAL LOW (ref 3.5–5.0)
Alkaline Phosphatase: 1109 U/L — ABNORMAL HIGH (ref 38–126)
Anion gap: 9 (ref 5–15)
BUN: 9 mg/dL (ref 6–20)
CO2: 25 mmol/L (ref 22–32)
Calcium: 7.5 mg/dL — ABNORMAL LOW (ref 8.9–10.3)
Chloride: 101 mmol/L (ref 98–111)
Creatinine: 0.33 mg/dL — ABNORMAL LOW (ref 0.44–1.00)
GFR, Estimated: >60 mL/min (ref >60)
Glucose, Bld: 107 mg/dL — ABNORMAL HIGH (ref 70–99)
Potassium: 3.2 mmol/L — ABNORMAL LOW (ref 3.5–5.1)
Sodium: 135 mmol/L (ref 135–145)
Total Bilirubin: 0.6 mg/dL (ref 0.0–1.2)
Total Protein: 6.6 g/dL (ref 6.5–8.1)

## 2024-02-22 LAB — CBC WITH DIFFERENTIAL (CANCER CENTER ONLY)
Abs Immature Granulocytes: 0.06 K/uL (ref 0.00–0.07)
Basophils Absolute: 0 K/uL (ref 0.0–0.1)
Basophils Relative: 0 %
Eosinophils Absolute: 0 K/uL (ref 0.0–0.5)
Eosinophils Relative: 1 %
HCT: 23.4 % — ABNORMAL LOW (ref 36.0–46.0)
Hemoglobin: 8 g/dL — ABNORMAL LOW (ref 12.0–15.0)
Immature Granulocytes: 2 %
Lymphocytes Relative: 33 %
Lymphs Abs: 0.9 K/uL (ref 0.7–4.0)
MCH: 25.6 pg — ABNORMAL LOW (ref 26.0–34.0)
MCHC: 34.2 g/dL (ref 30.0–36.0)
MCV: 75 fL — ABNORMAL LOW (ref 80.0–100.0)
Monocytes Absolute: 0.2 K/uL (ref 0.1–1.0)
Monocytes Relative: 6 %
Neutro Abs: 1.6 K/uL — ABNORMAL LOW (ref 1.7–7.7)
Neutrophils Relative %: 58 %
Platelet Count: 124 K/uL — ABNORMAL LOW (ref 150–400)
RBC: 3.12 MIL/uL — ABNORMAL LOW (ref 3.87–5.11)
RDW: 19.9 % — ABNORMAL HIGH (ref 11.5–15.5)
WBC Count: 2.8 K/uL — ABNORMAL LOW (ref 4.0–10.5)
nRBC: 0 % (ref 0.0–0.2)

## 2024-02-22 LAB — MAGNESIUM: Magnesium: 1.1 mg/dL — ABNORMAL LOW (ref 1.7–2.4)

## 2024-02-22 MED ORDER — POTASSIUM CHLORIDE IN NACL 20-0.9 MEQ/L-% IV SOLN
Freq: Once | INTRAVENOUS | Status: AC
  Filled 2024-02-22: qty 1000

## 2024-02-22 MED ORDER — CIPROFLOXACIN HCL 500 MG PO TABS
500.0000 mg | ORAL_TABLET | Freq: Two times a day (BID) | ORAL | 0 refills | Status: DC
  Filled 2024-02-22: qty 14, 7d supply, fill #0

## 2024-02-22 MED ORDER — MAGNESIUM SULFATE 2 GM/50ML IV SOLN
2.0000 g | Freq: Once | INTRAVENOUS | Status: AC
  Administered 2024-02-22: 2 g via INTRAVENOUS
  Filled 2024-02-22: qty 50

## 2024-02-22 MED ORDER — ONDANSETRON HCL 4 MG/2ML IJ SOLN
8.0000 mg | Freq: Once | INTRAMUSCULAR | Status: AC
  Administered 2024-02-22: 8 mg via INTRAVENOUS
  Filled 2024-02-22: qty 4

## 2024-02-22 MED ORDER — SULFAMETHOXAZOLE-TRIMETHOPRIM 800-160 MG PO TABS
1.0000 | ORAL_TABLET | Freq: Two times a day (BID) | ORAL | 0 refills | Status: DC
  Filled 2024-02-22: qty 14, 7d supply, fill #0

## 2024-02-22 NOTE — Progress Notes (Signed)
 Upon completion of IVFs + electrolytes VS were obtained. Temp found to be 100.2 F, upon recheck 13 minutes later temp was 100.3 F. Pt had c/o chills. Pt provided a blanket. This RN made Morna Kendall NP aware.  This RN received v/o from Lane NP for blood cultures x 2, UC and UA to be obtained for a fever workup. Per Morna NP Pt can be d/c home after labs contained and Morna to call Pt with results and see Pt tomorrow for fever f/u. This RN made Pt and Pt's son aware. Pt verbalized understanding and was agreeable to have labs obtained, but refused f/u appt for 02/22/24. This RN reeducated Pt on ED precautions and informed Pt to call with any worsened changes regarding symptoms. Morna NP made aware of appt refusal.

## 2024-02-22 NOTE — Progress Notes (Signed)
 1L NS 0.9% + 20 mEq Kcl x1 ordered per Morna Kendall, NP.  [name] Clowers, PharmD, MBA

## 2024-02-22 NOTE — Patient Instructions (Signed)
 Rehydration, Adult Rehydration is the replacement of fluids, salts, and minerals in the body (electrolytes) that are lost during dehydration. Dehydration is when there is not enough water or other fluids in the body. This happens when you lose more fluids than you take in. Common causes of dehydration include: Not drinking enough fluids. This can occur when you are ill or doing activities that require a lot of energy, especially in hot weather. Conditions that cause loss of water or other fluids. These include diarrhea, vomiting, sweating, and urinating a lot. Other illnesses, such as fever or infection. Certain medicines, such as those that remove excess fluid from the body (diuretics). Symptoms of mild or moderate dehydration may include thirst, dry lips and mouth, and dizziness. Symptoms of severe dehydration may include increased heart rate, confusion, fainting, and not urinating. In severe cases, you may need to get fluids through an IV at the hospital. For mild or moderate cases, you can usually rehydrate at home by drinking certain fluids as told by your health care provider. What are the risks? Your health care provider will talk with you about risks. Your health care provider will talk with you about risks. This may include taking in too much fluid (overhydration). This is rare. Overhydration can cause an imbalance of electrolytes in the body, kidney failure, or a decrease in salt (sodium) levels in the body. Supplies needed: You will need an oral rehydration solution (ORS) if your health care provider tells you to use one. This is a drink to treat dehydration. It can be found in pharmacies and retail stores. How to rehydrate Fluids Follow instructions from your health care provider about what to drink. The kind of fluid and the amount you should drink depend on your condition. In general, you should choose drinks that you prefer. If told by your health care provider, drink an ORS. Make an  ORS by following instructions on the package. Start by drinking small amounts, about  cup (120 mL) every 5-10 minutes. Slowly increase how much you drink until you have taken in the amount recommended by your health care provider. Drink enough clear fluids to keep your urine pale yellow. If you were told to drink an ORS, finish it first, then start slowly drinking other clear fluids. Drink fluids such as: Water. This includes sparkling and flavored water. Drinking only water can lead to having too little sodium in your body (hyponatremia). Follow the advice of your health care provider. Water from ice chips you suck on. Fruit juice with water added to it (diluted). Sports drinks. Hot or cold herbal teas. Broth-based soups. Milk or milk products. Food Follow instructions from your health care provider about what to eat while you rehydrate. Your health care provider may recommend that you slowly begin eating regular foods in small amounts. Eat foods that contain a healthy balance of electrolytes, such as bananas, oranges, potatoes, tomatoes, and spinach. Avoid foods that are greasy or contain a lot of sugar. In some cases, you may get nutrition through a feeding tube that is passed through your nose and into your stomach (nasogastric tube, or NG tube). This may be done if you have uncontrolled vomiting or diarrhea. Drinks to avoid  Certain drinks may make dehydration worse. While you rehydrate, avoid drinking alcohol. How to tell if you are recovering from dehydration You may be getting better if: You are urinating more often than before you started rehydrating. Your urine is pale yellow. Your energy level improves. You vomit less  often. You have diarrhea less often. Your appetite improves or returns to normal. You feel less dizzy or light-headed. Your skin tone and color start to look more normal. Follow these instructions at home: Take over-the-counter and prescription medicines only  as told by your health care provider. Do not take sodium tablets. Doing this can lead to having too much sodium in your body (hypernatremia). Contact a health care provider if: You continue to have symptoms of mild or moderate dehydration, such as: Thirst. Dry lips. Slightly dry mouth. Dizziness. Dark urine or less urine than normal. Muscle cramps. You continue to vomit or have diarrhea. Get help right away if: You have symptoms of dehydration that get worse. You have a fever. You have a severe headache. You have been vomiting and have problems, such as: Your vomiting gets worse or does not go away. Your vomit includes blood or green matter (bile). You cannot eat or drink without vomiting. You have problems with urination or bowel movements, such as: Diarrhea that gets worse or does not go away. Blood in your stool (feces). This may cause stool to look black and tarry. Not urinating, or urinating only a small amount of very dark urine, within 6-8 hours. You have trouble breathing. You have symptoms that get worse with treatment. These symptoms may be an emergency. Get help right away. Call 911. Do not wait to see if the symptoms will go away. Do not drive yourself to the hospital. This information is not intended to replace advice given to you by your health care provider. Make sure you discuss any questions you have with your health care provider. Document Revised: 10/19/2021 Document Reviewed: 10/19/2021 Elsevier Patient Education  2024 [employer].

## 2024-02-22 NOTE — Telephone Encounter (Signed)
 Called pt to make aware that ABT's are currently at [facility] pharmacy. While speaking with pt and family member (niece), pt was vomiting. Advised to take anti-nausea medication and also advised pt to go to ER to be monitored. Pt stated that she will go in the morning if she doesn't feel good after taking ABT. Also, made aware that appt for EKG and visit with NP has been scheduled for Monday 9/8. Pt and family verbalized understanding.

## 2024-02-23 ENCOUNTER — Other Ambulatory Visit: Payer: MEDICAID | Admitting: *Deleted

## 2024-02-23 ENCOUNTER — Encounter: Payer: Self-pay | Admitting: General Practice

## 2024-02-23 LAB — URINE CULTURE: Culture: <10000 — AB

## 2024-02-23 NOTE — Addendum Note (Signed)
 Encounter addended by: Crawford Dorothyann POUR on: 02/23/2024 1:03 PM  Actions taken: Imaging Exam ended

## 2024-02-23 NOTE — Patient Instructions (Signed)
 Visit Information  Thank you for taking time to visit with me today. Please don't hesitate to contact me if I can be of assistance to you before our next scheduled telephone appointment.   Following is a copy of your care plan:   Goals Addressed             This Visit's Progress    VBCI Transitions of Care (TOC) Care Plan       Problems:  Recent Hospitalization for treatment of SBO Knowledge Deficit Related to ostomy care  Goal:  Over the next 30 days, the patient will not experience hospital readmission  Interventions:  Transitions of Care: Doctor Visits  - discussed the importance of doctor visits Post discharge activity limitations prescribed by provider reviewed Post-op wound/incision care reviewed with patient/caregiver Reviewed Signs and symptoms of infection Reviewed upcoming appointments including:02/27/24 and 02/28/24 with [facility] and 03/07/24 with Palliative Care  Reviewed provider notes from recent visits Provided patient information on [employer] to inquire about free ostomy supplies-patient has not followed up Medications reviewed, discussed the importance of taking antiemetics to relieve nausea Educated on sipping fluids, ice chips, and bland foods like crackers, potato, rice and applesauce  Patient Self Care Activities:  Attend all scheduled provider appointments Call provider office for new concerns or questions  Notify RN Care Manager of [facility] call rescheduling needs Participate in Transition of Care Program/Attend [facility] scheduled calls  Plan:  Telephone follow up appointment with care management team member scheduled for:  02/29/24 at 12:45pm        Patient verbalizes understanding of instructions and care plan provided today and agrees to view in MyChart. Active MyChart status and patient understanding of how to access instructions and care plan via MyChart confirmed with patient.     Telephone follow up appointment with care management team member scheduled  for:02/29/24 at 12:45pm  Please call the care guide team at [phone] if you need to cancel or reschedule your appointment.   Please call [phone]-TALK (toll free, 24 hour hotline) go to [facility] Urgent [facility] [address] [place] [phone]) call 911 if you are experiencing a Mental Health or Behavioral Health Crisis or need someone to talk to.  [name] RN, BSN [place]  Value-Based Care [facility] RN Care Manager [phone]

## 2024-02-23 NOTE — Transitions of Care (Post Inpatient/ED Visit) (Signed)
 Transition of Care week 3  Visit Note  02/23/2024  Name: Chelsea Hansen MRN: [id number]          DOB: 11/16/1970  Situation: Patient enrolled in [facility] 30-day program. Visit completed with Chelsea Hansen by telephone.   Background:   Initial Transition Care Management Follow-up Telephone Call    Past Medical History:  Diagnosis Date   Allergy    seasonal allergies   Anxiety    on meds   Asthma    uses inhaler   Breast cancer (HCC) 2012   RIGHT lumpectomy   Cancer (HCC) 2022   RIGHT breast lump-dx 2022   Depression    on meds   DVT (deep venous thrombosis) (HCC) 2010   after hysterectomy   Family history of uterine cancer    GERD (gastroesophageal reflux disease)    with certain foods/OTC PRN meds   Headache(784.0)    History of radiation therapy    Bilateral breast- 09/03/21-11/02/21- Dr. Lynwood Nasuti   Hyperlipidemia    on meds   Hypertension    on meds   SVT (supraventricular tachycardia) (HCC)     Assessment: Patient Reported Symptoms: Cognitive Cognitive Status: Alert and oriented to person, place, and time, Able to follow simple commands, Normal speech and language skills      Neurological Neurological Review of Symptoms: Not assessed    HEENT HEENT Symptoms Reported: Not assessed      Cardiovascular Cardiovascular Symptoms Reported: Not assessed    Respiratory Respiratory Symptoms Reported: Not assesed    Endocrine Endocrine Symptoms Reported: Not assessed    Gastrointestinal Gastrointestinal Symptoms Reported: Nausea Additional Gastrointestinal Details: G tube in place. Nausea post chemo infusion. Denies vomiting today Gastrointestinal Management Strategies: Adequate rest, Medication therapy Gastrointestinal Self-Management Outcome: 2 (bad) Gastrointestinal Comment: Advised patient to take prescribed antimietic for nausea, sip fluids and suck on ice chips to avoid dehydration.    Genitourinary Genitourinary Symptoms Reported: Not assessed     Integumentary Integumentary Symptoms Reported: Not assessed    Musculoskeletal Musculoskelatal Symptoms Reviewed: Not assessed        Psychosocial Psychosocial Symptoms Reported: Not assessed         There were no vitals filed for this visit.  Medications Reviewed Today     Reviewed by [name], RN (Registered Nurse) on 02/23/24 at 1355  Med List Status: <None>   Medication Order Taking? Sig Documenting Provider Last Dose Status Informant  ciprofloxacin  (CIPRO ) 500 MG tablet [id number]  Take 1 tablet (500 mg total) by mouth 2 (two) times daily.  Patient not taking: Reported on 02/23/2024   [name], NP  Active   dexamethasone  (DECADRON ) 4 MG tablet [id number] Yes Take 2 tablets (8 mg) by mouth daily for 3 days starting the day after chemotherapy. Take with food. [name], [name], MD  Active   fentaNYL  (DURAGESIC ) 12 MCG/HR [id number] Yes Place 1 patch onto the skin every 3 (three) days. Samtani, Jai-Gurmukh, MD  Active   lidocaine -prilocaine  (EMLA ) cream [id number] Yes Apply to affected area once [name], [name], MD  Active   Multiple Vitamin (MULTIVITAMIN WITH MINERALS) TABS tablet [id number] Yes Take 1 tablet by mouth daily. Samtani, Jai-Gurmukh, MD  Active   ondansetron  (ZOFRAN ) 8 MG tablet [id number] Yes Take 1 tablet (8 mg total) by mouth every 8 (eight) hours as needed for nausea or vomiting. Start on the third day after chemotherapy. [name], [name], MD  Active   oxyCODONE  (ROXICODONE ) 15 MG immediate release tablet [id number] Yes Take  1 tablet (15 mg total) by mouth every 4 (four) hours as needed for pain. [name], [name], NP  Active   pantoprazole  (PROTONIX ) 40 MG tablet [id number] Yes Take 1 tablet (40 mg total) by mouth 2 (two) times daily. [name], NP  Active Self, Pharmacy Records, Multiple Informants  polyethylene glycol powder (GLYCOLAX /MIRALAX ) 17 GM/SCOOP powder [id number]  Mix 17 g in 4 oz of liquid and take by mouth 2 (two) times  daily as needed.  Patient not taking: Reported on 02/23/2024   [name], NP  Active Self, Pharmacy Records, Multiple Informants           Med Note (CRUTHIS, CHLOE C   Tue Dec 13, 2023  8:00 AM)    prochlorperazine  (COMPAZINE ) 10 MG tablet [id number] Yes Take 1 tablet (10 mg total) by mouth every 6 (six) hours as needed for nausea or vomiting. [name], [name], MD  Active   senna (SENOKOT) 8.6 MG TABS tablet [id number]  Take 2 tablets (17.2 mg total) by mouth 2 (two) times daily.  Patient not taking: Reported on 02/23/2024   [name], [name], NP  Active Self, Pharmacy Records, Multiple Informants  sulfamethoxazole -trimethoprim  (BACTRIM  DS) 800-160 MG tablet [id number]  Take 1 tablet by mouth 2 (two) times daily.  Patient not taking: Reported on 02/23/2024   [name], NP  Active   thiamine  (VITAMIN B1) 100 MG tablet [id number]  Take 1 tablet (100 mg total) by mouth daily.  Patient not taking: Reported on 02/23/2024   Samtani, Jai-Gurmukh, MD  Active   Med List Note Rockney Norleen LABOR, RPH-CPP 07/07/22 9196): Abemaciclib  through [facility];   UDS 02/11/22  MR 05/22/22 Medication Agreement signed. LP  Hydrocodone  approved through [phone] 23             Recommendation:   Continue Current Plan of Care  Follow Up Plan:   Telephone follow-up in 1 week  [name] RN, BSN [place]  Value-Based Care [facility] RN Care Manager [phone]

## 2024-02-23 NOTE — Progress Notes (Signed)
 [facility] Spiritual Care Note  Followed up with Chelsea Hansen by phone to answer her question about updating her Advance Directives and to schedule her for [facility] 03/16/2024 at 10:30, per her request, to do so.   [address] [name], [place], [facility] Pager [phone] Voicemail [phone]

## 2024-02-25 ENCOUNTER — Inpatient Hospital Stay: Payer: MEDICAID

## 2024-02-25 VITALS — BP 98/69 | HR 116 | Temp 97.9°F | Resp 16

## 2024-02-25 DIAGNOSIS — Z95828 Presence of other vascular implants and grafts: Secondary | ICD-10-CM

## 2024-02-25 DIAGNOSIS — Z5112 Encounter for antineoplastic immunotherapy: Secondary | ICD-10-CM | POA: Diagnosis not present

## 2024-02-25 MED ORDER — SODIUM CHLORIDE 0.9% FLUSH: 10.0000 mL | Freq: Once | INTRAVENOUS | Status: DC | PRN

## 2024-02-25 MED ORDER — SODIUM CHLORIDE 0.9 % IV SOLN: Freq: Once | INTRAVENOUS | Status: DC

## 2024-02-25 MED ORDER — SODIUM CHLORIDE 0.9 % IV SOLN: Freq: Once | INTRAVENOUS | Status: AC

## 2024-02-25 MED ORDER — ONDANSETRON HCL 4 MG/2ML IJ SOLN
8.0000 mg | Freq: Once | INTRAMUSCULAR | Status: AC
  Administered 2024-02-25: 8 mg via INTRAVENOUS
  Filled 2024-02-25: qty 4

## 2024-02-26 ENCOUNTER — Encounter: Payer: Self-pay | Admitting: Hematology and Oncology

## 2024-02-26 NOTE — Progress Notes (Signed)
 [facility] Cancer Follow up:    [name], NP [id number] [place] [address]   DIAGNOSIS: Cancer Staging  Breast cancer [place]) Staging form: Breast, AJCC 8th Edition - Clinical stage from 12/17/2020: Stage IA (cT1b, cN0, cM0, G2, ER+, PR+, HER2-) - Signed by [name], NP on 01/14/2022 Stage prefix: Initial diagnosis Histologic grading system: 3 grade system Laterality: Left Staged by: Pathologist and managing physician Stage used in treatment planning: Yes National guidelines used in treatment planning: Yes Type of national guideline used in treatment planning: NCCN - Pathologic stage from 07/09/2021: ypTis (DCIS), ypN0, cM0 - Signed by [name], NP on 07/22/2021 Stage prefix: Post-therapy  Malignant neoplasm of upper-outer quadrant of right breast in female, estrogen receptor positive (HCC) Staging form: Breast, AJCC 8th Edition - Clinical stage from 12/17/2020: Stage IIA (cT2, cN1, cM0, G2, ER+, PR+, HER2-) - Signed by [name], NP on 01/14/2022 Stage prefix: Initial diagnosis Histologic grading system: 3 grade system Laterality: Right Staged by: Pathologist and managing physician Stage used in treatment planning: Yes National guidelines used in treatment planning: Yes Type of national guideline used in treatment planning: NCCN - Pathologic stage from 07/09/2021: ypT2, ypN1a, cM0, G2 - Signed by [name], NP on 07/22/2021 Stage prefix: Post-therapy Histologic grading system: 3 grade system - Pathologic: Stage IV (cM1) - Signed by [name], NP on 12/07/2022    SUMMARY OF ONCOLOGIC HISTORY: Oncology History  Malignant neoplasm of upper-outer quadrant of right breast in female, estrogen receptor positive (HCC)  12/12/2020 Initial Diagnosis   status post bilateral breast biopsies 12/03/2020, showing             (1) on the right, a clinical T2 N1, stage IIa invasive lobular  carcinoma, grade 2, estrogen and progesterone receptor strongly positive, HER2 not amplified, with an MIB-1 of 40%                         (a) the biopsied right axillary lymph node was positive with extracapsular extension                         (b) a second right breast mass also biopsied was a fibroadenoma, concordant  MAMMAPRINT tested on biopsy returned high risk, luminal type B, indicating significant benefit from chemo                         (c) biopsy of an area of non-mass-like enhancement in the upper right breast pending   12/17/2020 Cancer Staging   Staging form: Breast, AJCC 8th Edition - Clinical stage from 12/17/2020: Stage IIA (cT2, cN1, cM0, G2, ER+, PR+, HER2-) - Signed by [name], NP on 01/14/2022 Stage prefix: Initial diagnosis Histologic grading system: 3 grade system Laterality: Right Staged by: Pathologist and managing physician Stage used in treatment planning: Yes National guidelines used in treatment planning: Yes Type of national guideline used in treatment planning: NCCN   12/24/2020 Genetic Testing   Negative genetic testing:  No pathogenic variants detected on the [employer] BRCAplus panel (report date 12/24/2020) or the CancerNext-Expanded + RNAinsight panel (report date 12/31/2020). 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   The BRCAplus panel offered by [employer] and includes sequencing and deletion/duplication analysis for the following 8 genes: ATM, BRCA1, BRCA2, CDH1, CHEK2, PALB2, PTEN, and  TP53. The CancerNext-Expanded + RNAinsight gene panel offered by [employer] and includes sequencing and rearrangement analysis for the following 77 genes: AIP, ALK, APC, ATM, AXIN2, BAP1, BARD1, BLM, BMPR1A, BRCA1, BRCA2, BRIP1, CDC73, CDH1, CDK4, CDKN1B, CDKN2A, CHEK2, CTNNA1, DICER1, FANCC, FH, FLCN, GALNT12, KIF1B, LZTR1, MAX, MEN1, MET, MLH1, MSH2, MSH3, MSH6, MUTYH, NBN, NF1, NF2, NTHL1, PALB2,  PHOX2B, PMS2, POT1, PRKAR1A, PTCH1, PTEN, RAD51C, RAD51D, RB1, RECQL, RET, SDHA, SDHAF2, SDHB, SDHC, SDHD, SMAD4, SMARCA4, SMARCB1, SMARCE1, STK11, SUFU, TMEM127, TP53, TSC1, TSC2, VHL and XRCC2 (sequencing and deletion/duplication); EGFR, EGLN1, HOXB13, KIT, MITF, PDGFRA, POLD1 and POLE (sequencing only); EPCAM and GREM1 (deletion/duplication only). RNA data is routinely analyzed for use in variant interpretation for all genes.   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 Multifocal sclerotic bony metastatic disease greatest in the thoracic spine with scattered rib lesions and sclerosis as well. 2. No nodal or visceral metastasis about the chest. 3. Collateral pathways about the chest suggest some subclavian venous narrowing on the LEFT. This could also be related to arm position. Correlate clinically. 4. Signs of RIGHT breast lumpectomy. Potential postoperative changes in the LEFT breast with small focal area of nodularity in the lateral LEFT breast  which could also be postoperative, correlate clinically and consider mammographic correlation as warranted. 5. Mild cardiac enlargement.   05/18/2022 Treatment Plan Change   Anastrozole  + Verzenio  100mg  PO BID   12/07/2022 Cancer Staging   Staging form: Breast, AJCC 8th Edition - Pathologic: Stage IV (cM1) - Signed by [name], NP on 12/07/2022   02/16/2024 -  Chemotherapy   Patient is on Treatment Plan : BREAST Fam-Trastuzumab Deruxtecan-nxki  (Enhertu ) (5.4) q21d     Breast cancer (HCC)  12/12/2020 Initial Diagnosis   on the left, a clinical T1b N0, stage IA invasive ductal carcinoma, grade 1 or 2, estrogen and progesterone receptor positive, HER2 not amplified, with an MIB 1 of 15%   12/17/2020 Cancer Staging   Staging form: Breast, AJCC 8th Edition - Clinical stage from 12/17/2020: Stage IA (cT1b, cN0, cM0, G2, ER+, PR+, HER2-) - Signed by [name], NP on 01/14/2022 Stage prefix: Initial diagnosis Histologic grading system: 3 grade system Laterality: Left Staged by: Pathologist and managing physician Stage used in treatment planning: Yes National guidelines used in treatment planning: Yes Type of national guideline used in treatment planning: NCCN   12/24/2020 Genetic Testing   Negative genetic testing:  No pathogenic variants detected on the [employer] BRCAplus panel (report date 12/24/2020) or the CancerNext-Expanded + RNAinsight panel (report date 12/31/2020). A variant of uncertain  significance (VUS) was detected in the ATM gene called p.D44G (c.131A>G).   The BRCAplus panel offered by [employer] and includes sequencing and deletion/duplication analysis for the following 8 genes: ATM, BRCA1, BRCA2, CDH1, CHEK2, PALB2, PTEN, and TP53. The CancerNext-Expanded + RNAinsight gene panel offered by [employer] and includes sequencing and rearrangement analysis for the following 77 genes: AIP, ALK, APC, ATM, AXIN2, BAP1, BARD1, BLM, BMPR1A, BRCA1, BRCA2, BRIP1,  CDC73, CDH1, CDK4, CDKN1B, CDKN2A, CHEK2, CTNNA1, DICER1, FANCC, FH, FLCN, GALNT12, KIF1B, LZTR1, MAX, MEN1, MET, MLH1, MSH2, MSH3, MSH6, MUTYH, NBN, NF1, NF2, NTHL1, PALB2, PHOX2B, PMS2, POT1, PRKAR1A, PTCH1, PTEN, RAD51C, RAD51D, RB1, RECQL, RET, SDHA, SDHAF2, SDHB, SDHC, SDHD, SMAD4, SMARCA4, SMARCB1, SMARCE1, STK11, SUFU, TMEM127, TP53, TSC1, TSC2, VHL and XRCC2 (sequencing and deletion/duplication); EGFR, EGLN1, HOXB13, KIT, MITF, PDGFRA, POLD1 and POLE (sequencing only); EPCAM and GREM1 (deletion/duplication only). RNA data is routinely analyzed for use in variant interpretation for all genes.   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 Multifocal sclerotic bony metastatic disease greatest in the thoracic spine with scattered rib lesions and sclerosis as well. 2. No nodal or visceral metastasis about the chest. 3. Collateral pathways about the chest suggest some subclavian venous narrowing on the LEFT. This could also be related to arm position. Correlate clinically. 4. Signs of RIGHT  breast lumpectomy. Potential postoperative changes in the LEFT breast with small focal area of nodularity in the lateral LEFT breast which could also be postoperative, correlate clinically and consider mammographic correlation as warranted. 5. Mild cardiac enlargement.   05/18/2022 Treatment Plan Change   Anastrozole  + Verzenio  100mg  PO BID   06/23/2022 - 07/07/2022 Radiation Therapy   06/23/2022 through 07/07/2022 Site Technique Total Dose (Gy) Dose per Fx (Gy) Completed Fx Beam Energies  Lumbar Spine: Spine_LS_Pelv 3D 30/30 3 10/10 15X       CURRENT THERAPY: Enhertu   INTERVAL HISTORY:  Discussed the use of AI scribe software for clinical note transcription with the patient, who gave verbal consent to proceed.  History of Present Illness Chelsea Hansen is a 53 year old female with cancer who presents for follow-up after receiving HER2 treatment. She is accompanied by her daughter, who is driving her to her mother's house.  She experiences low blood pressure and faintness, likely related to inadequate food intake. Her dietary intake has been minimal, with increased water  and juice consumption. Her urine, initially dark, is now clearer, suggesting improving hydration status.  Nausea persists, with some relief from antiemetic medication, though she has vomited once since her last visit. She had a large volume of diarrhea this morning.  Post-chemotherapy, she felt well initially but her condition has since deteriorated.  Her hemoglobin level is 8, potassium is 3.2, and calcium  is low. She has not yet started taking Calcium  with tums.  She says she hasn't had a chance to pick some up.     Patient Active Problem List   Diagnosis Date Noted   Gastrostomy tube present (HCC) 02/10/2024   Gastrostomy site leak (HCC) 02/10/2024   Irritant contact dermatitis associated with digestive stoma 02/10/2024   Protein-calorie malnutrition, severe 02/03/2024   SBO (small bowel obstruction) (HCC)  02/01/2024   Generalized abdominal pain 01/17/2024   Elevated CA 19-9 level 01/16/2024   Hypophosphatemia 01/04/2024   Hypomagnesemia 01/04/2024   Pancreatic lesion 12/29/2023   Unintentional weight loss 12/29/2023   Nausea and vomiting 12/29/2023   Duodenal obstruction 12/29/2023   Abnormal MRI of abdomen 12/26/2023   Abnormal CT of the abdomen 12/23/2023   Elevated liver enzymes 12/23/2023   Acute kidney injury (HCC) 12/23/2023   AKI (acute kidney injury) (HCC) 12/22/2023   Transaminitis 12/22/2023   Pancreatic mass 12/22/2023   Right sided weakness 12/22/2023   Hypotension 12/22/2023   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   Abnormal CT scan, colon 12/14/2023   Acute esophagitis 12/14/2023   Candida esophagitis (HCC) 12/14/2023   Therapeutic opioid-induced constipation (OIC) 12/11/2023   Hypocalcemia 12/10/2023   Fever of unknown origin 12/10/2023   Sinus tachycardia 12/10/2023   Acute kidney injury superimposed on chronic kidney disease (HCC) 12/05/2023   Electrolyte abnormality 12/05/2023   Dehydration 12/05/2023   Prolonged QT interval 12/05/2023   Intractable nausea and vomiting 11/24/2023   Neoplasm related pain 12/07/2022   Constipation 06/30/2022   Nausea and vomiting in adult 06/29/2022   Hematemesis 06/28/2022   Breast cancer metastasized to bone, right (HCC) 05/20/2022   Supraventricular tachycardia (HCC) 07/08/2021   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   GERD (gastroesophageal reflux disease) 05/27/2021   HLD (hyperlipidemia) 05/27/2021   Essential hypertension 05/27/2021   Pain management contract signed 05/04/2021   Chronic pain syndrome (breast cancer) 05/04/2021   Palpitations 03/25/2021   Sinus bradycardia 03/25/2021   Daytime somnolence 03/25/2021   Snoring 03/25/2021   Obesity (BMI 30-39.9) 03/25/2021   Port-A-Cath in place 01/27/2021   Genetic testing 12/24/2020   Family history of uterine cancer 12/17/2020   Malignant neoplasm of upper-outer  quadrant of right breast in female, estrogen receptor positive (HCC) 12/12/2020   Breast cancer (HCC) 12/12/2020   Hypokalemia 01/02/2012   Chronic asthma 01/01/2012   Bradycardia 01/01/2012   Syncope 12/31/2011   Mediastinal adenopathy 12/31/2011   Anemia 12/31/2011   Chest pain 12/31/2011    is allergic to enhertu  [fam-trastuzumab  deruxtec-nxki].  MEDICAL HISTORY: Past Medical History:  Diagnosis Date   Allergy    seasonal allergies   Anxiety    on meds   Asthma    uses inhaler   Breast cancer (HCC) 2012   RIGHT lumpectomy   Cancer (HCC) 2022   RIGHT breast lump-dx 2022   Depression    on meds   DVT (deep venous thrombosis) (HCC) 2010   after hysterectomy   Family history of uterine cancer    GERD (gastroesophageal reflux disease)    with certain foods/OTC PRN meds   Headache(784.0)    History of radiation therapy    Bilateral breast- 09/03/21-11/02/21- Dr. Lynwood Nasuti   Hyperlipidemia    on meds   Hypertension    on meds   SVT (supraventricular tachycardia) (HCC)  SURGICAL HISTORY: Past Surgical History:  Procedure Laterality Date   ABDOMINAL HYSTERECTOMY     AXILLARY SENTINEL NODE BIOPSY Left 07/09/2021   Procedure: LEFT AXILLARY SENTINEL NODE BIOPSY;  Surgeon: [name], MD;  Location: [facility];  Service: General;  Laterality: Left;   BALLOON DILATION N/A 12/13/2023   Procedure: BALLOON DILATION;  Surgeon: Federico Rosario BROCKS, MD;  Location: [facility] END[facility]COPY;  Service: Gastroenterology;  Laterality: N/A;   BIOPSY  06/29/2022   Procedure: BIOPSY;  Surgeon: Legrand Victory LITTIE DOUGLAS, MD;  Location: [facility];  Service: Gastroenterology;;   BIOPSY OF SKIN SUBCUTANEOUS TISSUE AND/OR MUCOUS MEMBRANE  12/13/2023   Procedure: BIOPSY, SKIN, SUBCUTANEOUS TISSUE, OR MUCOUS MEMBRANE;  Surgeon: Federico Rosario BROCKS, MD;  Location: [facility] ENDOSCOPY;  Service: Gastroenterology;;   BREAST EXCISIONAL BIOPSY Right 09/16/2009   BREAST LUMPECTOMY WITH RADIOACTIVE SEED  LOCALIZATION Bilateral 07/09/2021   Procedure: BILATERAL BREAST LUMPECTOMY WITH RADIOACTIVE SEED LOCALIZATION;  Surgeon: [name], MD;  Location: [facility];  Service: General;  Laterality: Bilateral;   BREAST SURGERY     lumpectomy   ESOPHAGOGASTRODUODENOSCOPY N/A 12/13/2023   Procedure: EGD (ESOPHAGOGASTRODUODENOSCOPY);  Surgeon: Federico Rosario BROCKS, MD;  Location: [facility] END[facility]COPY;  Service: Gastroenterology;  Laterality: N/A;   ESOPHAGOGASTRODUODENOSCOPY N/A 12/26/2023   Procedure: EGD (ESOPHAGOGASTRODUODENOSCOPY);  Surgeon: Wilhelmenia Aloha Raddle., MD;  Location: [facility];  Service: Gastroenterology;  Laterality: N/A;   ESOPHAGOGASTRODUODENOSCOPY N/A 12/27/2023   Procedure: EGD (ESOPHAGOGASTRODUODENOSCOPY);  Surgeon: Wilhelmenia Aloha Raddle., MD;  Location: [facility];  Service: Gastroenterology;  Laterality: N/A;   ESOPHAGOGASTRODUODENOSCOPY (EGD) WITH PROPOFOL  N/A 06/29/2022   Procedure: ESOPHAGOGASTRODUODENOSCOPY (EGD) WITH PROPOFOL ;  Surgeon: Legrand Victory LITTIE DOUGLAS, MD;  Location: [facility];  Service: Gastroenterology;  Laterality: N/A;   EUS N/A 12/26/2023   Procedure: ULTRASOUND, UPPER GI TRACT, ENDOSCOPIC;  Surgeon: Wilhelmenia Aloha Raddle., MD;  Location: [facility];  Service: Gastroenterology;  Laterality: N/A;   EUS N/A 12/27/2023   Procedure: ULTRASOUND, UPPER GI TRACT, ENDOSCOPIC;  Surgeon: Wilhelmenia Aloha Raddle., MD;  Location: [facility];  Service: Gastroenterology;  Laterality: N/A;   IR CM INJ ANY COLONIC TUBE W/FLUORO  01/20/2024   IR IMAGING GUIDED PORT INSERTION  02/06/2024   IR REPLC DUODEN/JEJUNO TUBE PERCUT W/FLUORO  02/03/2024   LAPAROSCOPIC INSERTION GASTROSTOMY TUBE N/A 01/10/2024   Procedure: laparoscopic insertion of gastrostomy tube, laparoscopic insertion of jejunostomy tube, upper endoscopy;  Surgeon: Kinsinger, Herlene Righter, MD;  Location: [facility];  Service: General;  Laterality: N/A;  Laproscopic insertion   LAPAROSCOPIC ROUX-EN-Y GASTRIC BYPASS WITH  HIATAL HERNIA REPAIR N/A 01/02/2024   Procedure: LAPAROSCOPIC CREATION GASTRJEJONOSTOMY;  Surgeon: Lyndel Deward PARAS, MD;  Location: [facility];  Service: General;  Laterality: N/A;  GASTROJEJUNOSTOMY   PORT-A-CATH REMOVAL Left 07/09/2021   Procedure: REMOVAL PORT-A-CATH;  Surgeon: [name], MD;  Location: [facility];  Service: General;  Laterality: Left;   PORTACATH PLACEMENT Left 01/26/2021   Procedure: INSERTION PORT-A-CATH;  Surgeon: [name], MD;  Location: [facility];  Service: General;  Laterality: Left;   RADIOACTIVE SEED GUIDED AXILLARY SENTINEL LYMPH NODE Right 07/09/2021   Procedure: RADIOACTIVE SEED GUIDED RIGHT AXILLARY SENTINEL LYMPH NODE DISSECTION;  Surgeon: [name], MD;  Location: [facility];  Service: General;  Laterality: Right;   TUBAL LIGATION      SOCIAL HISTORY: Social History   Socioeconomic History   Marital status: Single    Spouse name: Not on file   Number of children: Not on file   Years of  education: Not on file   Highest education level: Not on file  Occupational History   Not on file  Tobacco Use   Smoking status: Never   Smokeless tobacco: Never  Vaping Use   Vaping status: Never Used  Substance and Sexual Activity   Alcohol use: Not Currently    Alcohol/week: 7.0 standard drinks of alcohol    Types: 7 Standard drinks or equivalent per week    Comment: occassional   Drug use: No   Sexual activity: Not Currently  Other Topics Concern   Not on file  Social History Narrative   Not on file   Social Drivers of Health   Financial Resource Strain: High Risk (02/13/2024)   Overall Financial Resource Strain (CARDIA)    Difficulty of Paying Living Expenses: Hard  Food Insecurity: Food Insecurity Present (02/16/2024)   Hunger Vital Sign    Worried About Running Out of Food in the Last Year: Sometimes true    Ran Out of Food in the Last Year: Sometimes true  Transportation Needs: No Transportation  Needs (02/13/2024)   PRAPARE - Administrator, Civil Service (Medical): No    Lack of Transportation (Non-Medical): No  Physical Activity: Inactive (02/13/2024)   Exercise Vital Sign    Days of Exercise per Week: 0 days    Minutes of Exercise per Session: 0 min  Stress: No Stress Concern Present (02/13/2024)   [employer] of Occupational Health - Occupational Stress Questionnaire    Feeling of Stress: Not at all  Social Connections: Moderately Isolated (02/13/2024)   Social Connection and Isolation Panel    Frequency of Communication with Friends and Family: More than three times a week    Frequency of Social Gatherings with Friends and Family: Once a week    Attends Religious Services: 1 to 4 times per year    Active Member of [employer] or Organizations: No    Attends Banker Meetings: Never    Marital Status: Never married  Intimate Partner Violence: Not At Risk (02/13/2024)   Humiliation, Afraid, Rape, and Kick questionnaire    Fear of Current or Ex-Partner: No    Emotionally Abused: No    Physically Abused: No    Sexually Abused: No    FAMILY HISTORY: Family History  Problem Relation Age of Onset   Breast cancer Mother        72s   Hypertension Father    Uterine cancer Maternal Aunt    Ovarian cancer Maternal Aunt    Breast cancer Maternal Aunt        81s   Breast cancer Cousin 71   Uterine cancer Cousin 50   Colon polyps Neg Hx    Colon cancer Neg Hx    Esophageal cancer Neg Hx    Stomach cancer Neg Hx     Review of Systems  Constitutional:  Positive for fatigue. Negative for appetite change, chills, fever and unexpected weight change.  HENT:   Negative for hearing loss, lump/mass and trouble swallowing.   Eyes:  Negative for eye problems and icterus.  Respiratory:  Negative for chest tightness, cough and shortness of breath.   Cardiovascular:  Negative for chest pain, leg swelling and palpitations.  Gastrointestinal:  Positive for nausea.  Negative for abdominal distention, abdominal pain, constipation, diarrhea and vomiting.  Endocrine: Negative for hot flashes.  Genitourinary:  Negative for difficulty urinating.   Musculoskeletal:  Negative for arthralgias.  Skin:  Negative for itching and rash.  Neurological:  Positive for light-headedness. Negative for extremity weakness, headaches and numbness.  Hematological:  Negative for adenopathy. Does not bruise/bleed easily.  Psychiatric/Behavioral:  Negative for depression. The patient is not nervous/anxious.       PHYSICAL EXAMINATION    Vitals:   02/22/24 0912  BP: (!) 84/55  Pulse: (!) 107  Resp: 17  Temp: 98.2 F (36.8 C)  SpO2: 99%    Physical Exam Constitutional:      General: She is not in acute distress.    Appearance: Normal appearance. She is ill-appearing. She is not toxic-appearing.  HENT:     Head: Normocephalic and atraumatic.     Mouth/Throat:     Mouth: Mucous membranes are moist.     Pharynx: Oropharynx is clear. No oropharyngeal exudate or posterior oropharyngeal erythema.  Eyes:     General: No scleral icterus. Cardiovascular:     Rate and Rhythm: Normal rate and regular rhythm.     Pulses: Normal pulses.     Heart sounds: Normal heart sounds.  Pulmonary:     Effort: Pulmonary effort is normal.     Breath sounds: Normal breath sounds.  Abdominal:     General: Abdomen is flat. Bowel sounds are normal. There is no distension.     Palpations: Abdomen is soft.     Tenderness: There is no abdominal tenderness.  Musculoskeletal:        General: No swelling.     Cervical back: Neck supple.  Lymphadenopathy:     Cervical: No cervical adenopathy.  Skin:    General: Skin is warm and dry.     Findings: No rash.  Neurological:     General: No focal deficit present.     Mental Status: She is alert.  Psychiatric:        Mood and Affect: Mood normal.        Behavior: Behavior normal.     LABORATORY DATA:  CBC    Component Value  Date/Time   WBC 2.8 (L) 02/22/2024 0834   WBC 5.4 02/04/2024 1129   RBC 3.12 (L) 02/22/2024 0834   HGB 8.0 (L) 02/22/2024 0834   HGB 11.5 10/06/2020 1432   HCT 23.4 (L) 02/22/2024 0834   HCT 36.8 10/06/2020 1432   PLT 124 (L) 02/22/2024 0834   PLT 323 10/06/2020 1432   MCV 75.0 (L) 02/22/2024 0834   MCV 73 (L) 10/06/2020 1432   MCH 25.6 (L) 02/22/2024 0834   MCHC 34.2 02/22/2024 0834   RDW 19.9 (H) 02/22/2024 0834   RDW 15.9 (H) 10/06/2020 1432   LYMPHSABS 0.9 02/22/2024 0834   LYMPHSABS 1.9 10/06/2020 1432   MONOABS 0.2 02/22/2024 0834   EOSABS 0.0 02/22/2024 0834   EOSABS 0.1 10/06/2020 1432   BASOSABS 0.0 02/22/2024 0834   BASOSABS 0.0 10/06/2020 1432    CMP     Component Value Date/Time   NA 135 02/22/2024 0834   NA 144 11/30/2023 1000   K 3.2 (L) 02/22/2024 0834   CL 101 02/22/2024 0834   CO2 25 02/22/2024 0834   GLUCOSE 107 (H) 02/22/2024 0834   BUN 9 02/22/2024 0834   BUN 19 11/30/2023 1000   CREATININE 0.33 (L) 02/22/2024 0834   CALCIUM  7.5 (L) 02/22/2024 0834   PROT 6.6 02/22/2024 0834   PROT 7.3 11/30/2023 1000   ALBUMIN  3.2 (L) 02/22/2024 0834   ALBUMIN  4.5 11/30/2023 1000   AST 82 (H) 02/22/2024 0834   ALT 167 (H) 02/22/2024 0834   ALKPHOS  1,109 (H) 02/22/2024 0834   BILITOT 0.6 02/22/2024 0834   GFRNONAA >60 02/22/2024 0834   GFRAA >60 09/07/2019 1340     ASSESSMENT and THERAPY PLAN:   Assessment and Plan Assessment & Plan 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 Imaging planned after ten weeks to evaluate response. - Continue current chemotherapy regimen. - Repeat imaging after ten weeks of treatment to assess response.  Chemotherapy-induced nausea, vomiting, and diarrhea Nausea, vomiting, and diarrhea as chemotherapy side effects. Vomited once since last visit, large volume diarrhea today. - Administer IV nausea  medication.  Chemotherapy-induced dehydration Dehydration likely from inadequate oral intake and diarrhea. Advised against traveling due to condition. - Administer IV fluids on Saturday and Monday. - Encourage oral hydration with water  and juice.  Hypokalemia secondary to chemotherapy and diarrhea Potassium level slightly low at 3.2, improved from 3.1. Large bowel movements contribute to potassium loss. - Administer IV fluids with potassium supplementation. - Monitor potassium levels.  Hypocalcemia secondary to chemotherapy Calcium  level slightly low, likely from not taking Tums as recommended. Tums aid calcium  management and antacid benefits. - Instruct to take two chewable Tums daily. - Avoid taking Tums around other medications to prevent absorption issues.  Mild anemia secondary to chemotherapy Hemoglobin level at 8, expected decrease due to chemotherapy. No significant infection risk increase, her WBC are stable. - Monitor hemoglobin levels.  Mild transaminitis secondary to chemotherapy Slightly elevated liver enzymes likely from chemotherapy irritation. Expected improvement by next week. - Repeat liver function tests next week.  RTC in 1 week for labs, f/u and IV fliuds    All questions were answered. The patient knows to call the clinic with any problems, questions or concerns. We can certainly see the patient much sooner if necessary.  Total encounter time:45 minutes*in face-to-face visit time, chart review, lab review, care coordination, order entry, and documentation of the encounter time.    Morna Kendall, NP 02/26/24 11:15 PM Medical Oncology and Hematology [facility] [address] [place], [place] [address] Tel. [phone]    Fax. [phone]  *Total Encounter Time as defined by the Centers for Medicare and Medicaid Services includes, in addition to the face-to-face time of a patient visit (documented in the note above) non-face-to-face time:  obtaining and reviewing outside history, ordering and reviewing medications, tests or procedures, care coordination (communications with other health care professionals or caregivers) and documentation in the medical record.

## 2024-02-27 ENCOUNTER — Inpatient Hospital Stay: Payer: MEDICAID

## 2024-02-27 ENCOUNTER — Other Ambulatory Visit: Payer: Self-pay

## 2024-02-27 ENCOUNTER — Encounter: Payer: Self-pay | Admitting: *Deleted

## 2024-02-27 ENCOUNTER — Emergency Department (HOSPITAL_COMMUNITY): Payer: MEDICAID

## 2024-02-27 ENCOUNTER — Inpatient Hospital Stay (HOSPITAL_COMMUNITY)
Admission: EM | Admit: 2024-02-27 | Discharge: 2024-02-28 | DRG: 862 | Discharge status: Against Medical Advice | Payer: MEDICAID | Attending: Family Medicine | Admitting: Family Medicine

## 2024-02-27 ENCOUNTER — Inpatient Hospital Stay (HOSPITAL_BASED_OUTPATIENT_CLINIC_OR_DEPARTMENT_OTHER): Payer: MEDICAID | Admitting: Adult Health

## 2024-02-27 VITALS — BP 101/61 | HR 122 | Temp 101.0°F | Resp 19 | Wt 161.5 lb

## 2024-02-27 DIAGNOSIS — Z853 Personal history of malignant neoplasm of breast: Secondary | ICD-10-CM

## 2024-02-27 DIAGNOSIS — Z923 Personal history of irradiation: Secondary | ICD-10-CM | POA: Diagnosis not present

## 2024-02-27 DIAGNOSIS — Z8249 Family history of ischemic heart disease and other diseases of the circulatory system: Secondary | ICD-10-CM | POA: Diagnosis not present

## 2024-02-27 DIAGNOSIS — Z931 Gastrostomy status: Secondary | ICD-10-CM | POA: Diagnosis not present

## 2024-02-27 DIAGNOSIS — Z95828 Presence of other vascular implants and grafts: Secondary | ICD-10-CM

## 2024-02-27 DIAGNOSIS — R188 Other ascites: Secondary | ICD-10-CM | POA: Diagnosis not present

## 2024-02-27 DIAGNOSIS — Z170 Estrogen receptor positive status [ER+]: Secondary | ICD-10-CM

## 2024-02-27 DIAGNOSIS — Z90710 Acquired absence of both cervix and uterus: Secondary | ICD-10-CM

## 2024-02-27 DIAGNOSIS — Z79899 Other long term (current) drug therapy: Secondary | ICD-10-CM

## 2024-02-27 DIAGNOSIS — E876 Hypokalemia: Secondary | ICD-10-CM | POA: Diagnosis present

## 2024-02-27 DIAGNOSIS — R509 Fever, unspecified: Secondary | ICD-10-CM

## 2024-02-27 DIAGNOSIS — Z8049 Family history of malignant neoplasm of other genital organs: Secondary | ICD-10-CM

## 2024-02-27 DIAGNOSIS — C50411 Malignant neoplasm of upper-outer quadrant of right female breast: Secondary | ICD-10-CM

## 2024-02-27 DIAGNOSIS — K651 Peritoneal abscess: Secondary | ICD-10-CM | POA: Diagnosis present

## 2024-02-27 DIAGNOSIS — D649 Anemia, unspecified: Secondary | ICD-10-CM | POA: Diagnosis present

## 2024-02-27 DIAGNOSIS — G8929 Other chronic pain: Secondary | ICD-10-CM | POA: Diagnosis present

## 2024-02-27 DIAGNOSIS — C7951 Secondary malignant neoplasm of bone: Secondary | ICD-10-CM | POA: Diagnosis present

## 2024-02-27 DIAGNOSIS — K869 Disease of pancreas, unspecified: Secondary | ICD-10-CM | POA: Diagnosis present

## 2024-02-27 DIAGNOSIS — E785 Hyperlipidemia, unspecified: Secondary | ICD-10-CM | POA: Diagnosis present

## 2024-02-27 DIAGNOSIS — J45909 Unspecified asthma, uncomplicated: Secondary | ICD-10-CM | POA: Diagnosis present

## 2024-02-27 DIAGNOSIS — K219 Gastro-esophageal reflux disease without esophagitis: Secondary | ICD-10-CM | POA: Diagnosis present

## 2024-02-27 DIAGNOSIS — F419 Anxiety disorder, unspecified: Secondary | ICD-10-CM | POA: Diagnosis present

## 2024-02-27 DIAGNOSIS — T8143XA Infection following a procedure, organ and space surgical site, initial encounter: Principal | ICD-10-CM | POA: Diagnosis present

## 2024-02-27 DIAGNOSIS — Z803 Family history of malignant neoplasm of breast: Secondary | ICD-10-CM | POA: Diagnosis not present

## 2024-02-27 DIAGNOSIS — I10 Essential (primary) hypertension: Secondary | ICD-10-CM | POA: Diagnosis present

## 2024-02-27 DIAGNOSIS — Z5329 Procedure and treatment not carried out because of patient's decision for other reasons: Secondary | ICD-10-CM | POA: Diagnosis present

## 2024-02-27 DIAGNOSIS — Z86718 Personal history of other venous thrombosis and embolism: Secondary | ICD-10-CM | POA: Diagnosis not present

## 2024-02-27 DIAGNOSIS — D61818 Other pancytopenia: Secondary | ICD-10-CM | POA: Diagnosis present

## 2024-02-27 DIAGNOSIS — Z8041 Family history of malignant neoplasm of ovary: Secondary | ICD-10-CM

## 2024-02-27 DIAGNOSIS — Z888 Allergy status to other drugs, medicaments and biological substances status: Secondary | ICD-10-CM

## 2024-02-27 DIAGNOSIS — C786 Secondary malignant neoplasm of retroperitoneum and peritoneum: Secondary | ICD-10-CM | POA: Diagnosis present

## 2024-02-27 LAB — I-STAT CHEM 8, ED
BUN: 6 mg/dL (ref 6–20)
Calcium, Ion: 1.12 mmol/L — ABNORMAL LOW (ref 1.15–1.40)
Chloride: 101 mmol/L (ref 98–111)
Creatinine, Ser: 0.7 mg/dL (ref 0.44–1.00)
Glucose, Bld: 99 mg/dL (ref 70–99)
HCT: 16 % — ABNORMAL LOW (ref 36.0–46.0)
Hemoglobin: 5.4 g/dL — CL (ref 12.0–15.0)
Potassium: 3.3 mmol/L — ABNORMAL LOW (ref 3.5–5.1)
Sodium: 136 mmol/L (ref 135–145)
TCO2: 24 mmol/L (ref 22–32)

## 2024-02-27 LAB — CMP (CANCER CENTER ONLY)
ALT: 35 U/L (ref 0–44)
AST: 14 U/L — ABNORMAL LOW (ref 15–41)
Albumin: 3.4 g/dL — ABNORMAL LOW (ref 3.5–5.0)
Alkaline Phosphatase: 809 U/L — ABNORMAL HIGH (ref 38–126)
Anion gap: 9 (ref 5–15)
BUN: 9 mg/dL (ref 6–20)
CO2: 26 mmol/L (ref 22–32)
Calcium: 8.2 mg/dL — ABNORMAL LOW (ref 8.9–10.3)
Chloride: 101 mmol/L (ref 98–111)
Creatinine: 0.51 mg/dL (ref 0.44–1.00)
GFR, Estimated: >60 mL/min (ref >60)
Glucose, Bld: 112 mg/dL — ABNORMAL HIGH (ref 70–99)
Potassium: 3.3 mmol/L — ABNORMAL LOW (ref 3.5–5.1)
Sodium: 136 mmol/L (ref 135–145)
Total Bilirubin: 0.7 mg/dL (ref 0.0–1.2)
Total Protein: 6.6 g/dL (ref 6.5–8.1)

## 2024-02-27 LAB — PREPARE RBC (CROSSMATCH)

## 2024-02-27 LAB — COMPREHENSIVE METABOLIC PANEL WITH GFR
ALT: 40 U/L (ref 0–44)
AST: 19 U/L (ref 15–41)
Albumin: 3.1 g/dL — ABNORMAL LOW (ref 3.5–5.0)
Alkaline Phosphatase: 811 U/L — ABNORMAL HIGH (ref 38–126)
Anion gap: 14 (ref 5–15)
BUN: 8 mg/dL (ref 6–20)
CO2: 22 mmol/L (ref 22–32)
Calcium: 8.5 mg/dL — ABNORMAL LOW (ref 8.9–10.3)
Chloride: 101 mmol/L (ref 98–111)
Creatinine, Ser: 0.56 mg/dL (ref 0.44–1.00)
GFR, Estimated: >60 mL/min (ref >60)
Glucose, Bld: 98 mg/dL (ref 70–99)
Potassium: 3.4 mmol/L — ABNORMAL LOW (ref 3.5–5.1)
Sodium: 136 mmol/L (ref 135–145)
Total Bilirubin: 0.6 mg/dL (ref 0.0–1.2)
Total Protein: 6 g/dL — ABNORMAL LOW (ref 6.5–8.1)

## 2024-02-27 LAB — CBC WITH DIFFERENTIAL (CANCER CENTER ONLY)
Abs Immature Granulocytes: 0.08 K/uL — ABNORMAL HIGH (ref 0.00–0.07)
Basophils Absolute: 0 K/uL (ref 0.0–0.1)
Basophils Relative: 0 %
Eosinophils Absolute: 0 K/uL (ref 0.0–0.5)
Eosinophils Relative: 0 %
HCT: 20.2 % — ABNORMAL LOW (ref 36.0–46.0)
Hemoglobin: 6.9 g/dL — CL (ref 12.0–15.0)
Immature Granulocytes: 3 %
Lymphocytes Relative: 29 %
Lymphs Abs: 0.8 K/uL (ref 0.7–4.0)
MCH: 25.7 pg — ABNORMAL LOW (ref 26.0–34.0)
MCHC: 34.2 g/dL (ref 30.0–36.0)
MCV: 75.1 fL — ABNORMAL LOW (ref 80.0–100.0)
Monocytes Absolute: 0.4 K/uL (ref 0.1–1.0)
Monocytes Relative: 15 %
Neutro Abs: 1.4 K/uL — ABNORMAL LOW (ref 1.7–7.7)
Neutrophils Relative %: 53 %
Platelet Count: 108 K/uL — ABNORMAL LOW (ref 150–400)
RBC: 2.69 MIL/uL — ABNORMAL LOW (ref 3.87–5.11)
RDW: 20.2 % — ABNORMAL HIGH (ref 11.5–15.5)
WBC Count: 2.7 K/uL — ABNORMAL LOW (ref 4.0–10.5)
nRBC: 0 % (ref 0.0–0.2)

## 2024-02-27 LAB — CBC WITH DIFFERENTIAL/PLATELET

## 2024-02-27 LAB — CULTURE, BLOOD (ROUTINE X 2)
Culture: NO GROWTH
Culture: NO GROWTH
Special Requests: ADEQUATE
Special Requests: ADEQUATE

## 2024-02-27 LAB — RESP PANEL BY RT-PCR (RSV, FLU A&B, COVID)  RVPGX2
Influenza A by PCR: NEGATIVE
Influenza B by PCR: NEGATIVE
Resp Syncytial Virus by PCR: NEGATIVE
SARS Coronavirus 2 by RT PCR: NEGATIVE

## 2024-02-27 LAB — PROTIME-INR
INR: 1.2 (ref 0.8–1.2)
Prothrombin Time: 16 s — ABNORMAL HIGH (ref 11.4–15.2)

## 2024-02-27 LAB — I-STAT CG4 LACTIC ACID, ED
Lactic Acid, Venous: 0.4 mmol/L — ABNORMAL LOW (ref 0.5–1.9)
Lactic Acid, Venous: 0.3 mmol/L — ABNORMAL LOW (ref 0.5–1.9)

## 2024-02-27 LAB — MAGNESIUM
Magnesium: 1.4 mg/dL — ABNORMAL LOW (ref 1.7–2.4)
Magnesium: 1.6 mg/dL — ABNORMAL LOW (ref 1.7–2.4)

## 2024-02-27 MED ORDER — SODIUM CHLORIDE 0.9 % IV SOLN
2.0000 g | Freq: Once | INTRAVENOUS | Status: AC
  Administered 2024-02-27: 2 g via INTRAVENOUS
  Filled 2024-02-27: qty 12.5

## 2024-02-27 MED ORDER — METRONIDAZOLE 500 MG/100ML IV SOLN
500.0000 mg | Freq: Once | INTRAVENOUS | Status: AC
  Administered 2024-02-27: 500 mg via INTRAVENOUS
  Filled 2024-02-27: qty 100

## 2024-02-27 MED ORDER — IOHEXOL 350 MG/ML SOLN: 100.0000 mL | Freq: Once | INTRAVENOUS | Status: DC | PRN

## 2024-02-27 MED ORDER — LACTATED RINGERS IV BOLUS (SEPSIS)
2000.0000 mL | Freq: Once | INTRAVENOUS | Status: AC
  Administered 2024-02-27: 2000 mL via INTRAVENOUS

## 2024-02-27 MED ORDER — ACETAMINOPHEN 10 MG/ML IV SOLN
1000.0000 mg | Freq: Once | INTRAVENOUS | Status: AC
Start: 2024-02-27 — End: 2024-02-28
  Administered 2024-02-27: 1000 mg via INTRAVENOUS
  Filled 2024-02-27: qty 100

## 2024-02-27 MED ORDER — POTASSIUM CHLORIDE 20 MEQ PO PACK: 40.0000 meq | PACK | Freq: Once | ORAL | Status: DC

## 2024-02-27 MED ORDER — ONDANSETRON HCL 4 MG/2ML IJ SOLN
4.0000 mg | Freq: Once | INTRAMUSCULAR | Status: AC
Start: 2024-02-27 — End: 2024-02-27
  Administered 2024-02-27: 4 mg via INTRAVENOUS
  Filled 2024-02-27: qty 2

## 2024-02-27 MED ORDER — IOHEXOL 350 MG/ML SOLN
100.0000 mL | Freq: Once | INTRAVENOUS | Status: AC | PRN
  Administered 2024-02-27: 100 mL via INTRAVENOUS

## 2024-02-27 MED ORDER — SODIUM CHLORIDE 0.9 % IV SOLN: Freq: Once | INTRAVENOUS | Status: AC

## 2024-02-27 MED ORDER — SODIUM CHLORIDE 0.9% IV SOLUTION: Freq: Once | INTRAVENOUS | Status: AC

## 2024-02-27 MED ORDER — SODIUM CHLORIDE 0.9 % IV SOLN: INTRAVENOUS | Status: DC

## 2024-02-27 MED ORDER — VANCOMYCIN HCL IN DEXTROSE 1-5 GM/200ML-% IV SOLN
1000.0000 mg | Freq: Once | INTRAVENOUS | Status: AC
  Administered 2024-02-27: 1000 mg via INTRAVENOUS
  Filled 2024-02-27: qty 200

## 2024-02-27 MED ORDER — HYDROMORPHONE HCL 1 MG/ML IJ SOLN
1.0000 mg | Freq: Once | INTRAMUSCULAR | Status: AC
  Administered 2024-02-27: 1 mg via INTRAVENOUS
  Filled 2024-02-27: qty 1

## 2024-02-27 MED ORDER — POTASSIUM CHLORIDE 10 MEQ/100ML IV SOLN
10.0000 meq | INTRAVENOUS | Status: AC
Start: 2024-02-27 — End: 2024-02-28
  Administered 2024-02-27 – 2024-02-28 (×3): 10 meq via INTRAVENOUS
  Filled 2024-02-27 (×3): qty 100

## 2024-02-27 MED ORDER — MAGNESIUM SULFATE 2 GM/50ML IV SOLN
2.0000 g | Freq: Once | INTRAVENOUS | Status: AC
Start: 2024-02-27 — End: 2024-02-28
  Administered 2024-02-27: 2 g via INTRAVENOUS
  Filled 2024-02-27: qty 50

## 2024-02-27 NOTE — ED Provider Notes (Signed)
 [place] [facility]ENC[facility] DEPARTMENT AT [facility] Provider Note   CSN: [id number] Arrival date & time: 02/27/24  1736     Patient presents with: Abnormal Lab   Chelsea Hansen is a 53 y.o. female.    Abnormal Lab Patient presents for abnormal lab work.  Medical history includes breast cancer, HTN, HLD, GERD, anxiety, depression, asthma, VTE.  She is not currently on a blood thinner.  She is currently on chemotherapy.  She was seen by oncology today.  In clinic, she had fever and tachycardia.  Lab work showed leukopenia, acute on chronic anemia, hypomagnesemia, and hypokalemia.  Patient denies any known recent blood loss.  Her last chemotherapy was 2 weeks ago.  She has had recent fatigue and generalized pain.  She has had abdominal pain, worsening over the past week.  Over this timeframe, she has a yellow drainage from gastrostomy site.  Per chart review, she underwent jejunostomy with IR on 8/1.  She was admitted 2 weeks later.  CT scan at the time showed progressive peritoneal carcinomatosis with withdrawal of jejunostomy catheter balloon within tract of anterior abdominal wall.  J-tube was removed.  She has since had a G-tube in place for venting.  Patient reports 1 episode of vomiting today.     Prior to Admission medications   Medication Sig Start Date End Date Taking? Authorizing Provider  ciprofloxacin  (CIPRO ) 500 MG tablet Take 1 tablet (500 mg total) by mouth 2 (two) times daily. Patient not taking: Reported on 02/27/2024 02/22/24   [name], NP  dexamethasone  (DECADRON ) 4 MG tablet Take 2 tablets (8 mg) by mouth daily for 3 days starting the day after chemotherapy. Take with food. 02/08/24   [name], MD  fentaNYL  (DURAGESIC ) 12 MCG/HR Place 1 patch onto the skin every 3 (three) days. 02/08/24   Samtani, Jai-Gurmukh, MD  lidocaine -prilocaine  (EMLA ) cream Apply to affected area once 02/08/24   [name], [name], MD  Multiple Vitamin (MULTIVITAMIN WITH MINERALS) TABS  tablet Take 1 tablet by mouth daily. 02/07/24   Samtani, Jai-Gurmukh, MD  ondansetron  (ZOFRAN ) 8 MG tablet Take 1 tablet (8 mg total) by mouth every 8 (eight) hours as needed for nausea or vomiting. Start on the third day after chemotherapy. 02/08/24   [name], MD  oxyCODONE  (ROXICODONE ) 15 MG immediate release tablet Take 1 tablet (15 mg total) by mouth every 4 (four) hours as needed for pain. 02/17/24   [name], Athena N, NP  pantoprazole  (PROTONIX ) 40 MG tablet Take 1 tablet (40 mg total) by mouth 2 (two) times daily. 01/25/24   [name], NP  polyethylene glycol powder (GLYCOLAX /MIRALAX ) 17 GM/SCOOP powder Mix 17 g in 4 oz of liquid and take by mouth 2 (two) times daily as needed. Patient not taking: Reported on 02/27/2024 11/30/23   [name], NP  prochlorperazine  (COMPAZINE ) 10 MG tablet Take 1 tablet (10 mg total) by mouth every 6 (six) hours as needed for nausea or vomiting. 02/08/24   [name], [name], MD  senna (SENOKOT) 8.6 MG TABS tablet Take 2 tablets (17.2 mg total) by mouth 2 (two) times daily. Patient not taking: Reported on 02/27/2024 01/31/24   [name], Athena N, NP  sulfamethoxazole -trimethoprim  (BACTRIM  DS) 800-160 MG tablet Take 1 tablet by mouth 2 (two) times daily. Patient not taking: Reported on 02/27/2024 02/22/24   [name], NP  thiamine  (VITAMIN B1) 100 MG tablet Take 1 tablet (100 mg total) by mouth daily. Patient not taking: Reported on 02/27/2024  02/07/24   Samtani, Jai-Gurmukh, MD    Allergies: Enhertu  [fam-trastuzumab  deruxtec-nxki]    Review of Systems  Constitutional:  Positive for fatigue and fever.  Gastrointestinal:  Positive for abdominal pain, nausea and vomiting.  Musculoskeletal:  Positive for myalgias.  Neurological:  Positive for weakness (Generalized).  All other systems reviewed and are negative.   Updated Vital Signs BP 107/68   Pulse 89   Temp 99.8 F (37.7 C) (Oral)   Resp 19   SpO2 96%    Physical Exam Vitals and nursing note reviewed.  Constitutional:      General: She is not in acute distress.    Appearance: Normal appearance. She is well-developed. She is not ill-appearing, toxic-appearing or diaphoretic.  HENT:     Head: Normocephalic and atraumatic.     Right Ear: External ear normal.     Left Ear: External ear normal.     Nose: Nose normal.     Mouth/Throat:     Mouth: Mucous membranes are moist.  Eyes:     Extraocular Movements: Extraocular movements intact.     Conjunctiva/sclera: Conjunctivae normal.  Cardiovascular:     Rate and Rhythm: Normal rate and regular rhythm.  Pulmonary:     Effort: Pulmonary effort is normal. No respiratory distress.  Abdominal:     Palpations: Abdomen is soft.     Tenderness: There is abdominal tenderness. There is no guarding or rebound.     Comments: G-tube in place.  Dressing around ostomy site is soaked in yellow discharge.  Colostomy bag in place with yellow stool contents.  Musculoskeletal:        General: No swelling. Normal range of motion.     Cervical back: Normal range of motion and neck supple.  Skin:    General: Skin is warm and dry.     Coloration: Skin is not jaundiced or pale.  Neurological:     General: No focal deficit present.     Mental Status: She is alert and oriented to person, place, and time.  Psychiatric:        Mood and Affect: Mood normal.        Behavior: Behavior normal.     (all labs ordered are listed, but only abnormal results are displayed) Labs Reviewed  COMPREHENSIVE METABOLIC PANEL WITH GFR - Abnormal; Notable for the following components:      Result Value   Potassium 3.4 (*)    Calcium  8.5 (*)    Total Protein 6.0 (*)    Albumin  3.1 (*)    Alkaline Phosphatase 811 (*)    All other components within normal limits  CBC WITH DIFFERENTIAL/PLATELET - Abnormal; Notable for the following components:   WBC 2.7 (*)    RBC 2.50 (*)    Hemoglobin 6.2 (*)    HCT 19.9 (*)    MCV  79.6 (*)    MCH 24.8 (*)    RDW 20.0 (*)    Platelets 120 (*)    nRBC 0.7 (*)    All other components within normal limits  PROTIME-INR - Abnormal; Notable for the following components:   Prothrombin Time 16.0 (*)    All other components within normal limits  MAGNESIUM  - Abnormal; Notable for the following components:   Magnesium  1.6 (*)    All other components within normal limits  I-STAT CG4 LACTIC ACID, ED - Abnormal; Notable for the following components:   Lactic Acid, Venous 0.4 (*)    All other components within normal  limits  I-STAT CHEM 8, ED - Abnormal; Notable for the following components:   Potassium 3.3 (*)    Calcium , Ion 1.12 (*)    Hemoglobin 5.4 (*)    HCT 16.0 (*)    All other components within normal limits  I-STAT CG4 LACTIC ACID, ED - Abnormal; Notable for the following components:   Lactic Acid, Venous 0.3 (*)    All other components within normal limits  RESP PANEL BY RT-PCR (RSV, FLU A&B, COVID)  RVPGX2  CULTURE, BLOOD (ROUTINE X 2)  CULTURE, BLOOD (ROUTINE X 2)  URINALYSIS, W/ REFLEX TO CULTURE (INFECTION SUSPECTED)  TYPE AND SCREEN  PREPARE RBC (CROSSMATCH)    EKG: None  Radiology: CT Angio Chest PE W and/or Wo Contrast Result Date: 02/27/2024 EXAM: CTA CHEST PE WITHOUT AND WITH CONTRAST CT ABDOMEN AND PELVIS WITHOUT AND WITH CONTRAST 02/27/2024 09:35:50 PM TECHNIQUE: CTA of the chest was performed after the administration of intravenous contrast. Multiplanar reformatted images are provided for review. MIP images are provided for review. CT of the abdomen and pelvis was performed with the administration of intravenous contrast. Automated exposure control, iterative reconstruction, and/or weight based adjustment of the mA/kV was utilized to reduce the radiation dose to as low as reasonably achievable. COMPARISON: CT abdomen and pelvis 02/02/2024; x-ray 02/27/2024 and CT 12/22/2023. CLINICAL HISTORY: Pulmonary embolism (PE) suspected, high prob. Pt arrives  via wheelchair from the cancer center. PT was being seen today for a scheduled visit, and was found to be TACHYcardic 130+, and a low hemoglobin. Pt refused admission, but agreed to come to the ED for a transfusion. Pt very adamant that she not be admitted to the hospital during triage. Port accessed prior to arrival. Sepsis. FINDINGS: CHEST: PULMONARY ARTERIES: Pulmonary arteries are adequately opacified for evaluation. No intraluminal filling defect to suggest pulmonary embolism. Main pulmonary artery is normal in caliber. MEDIASTINUM: No mediastinal lymphadenopathy. The heart and pericardium demonstrate no acute abnormality. There is no acute abnormality of the thoracic aorta. LUNGS AND PLEURA: Similar appearing bilateral upper lobe clustered centrilobular nodules. The largest nodule measures 6 mm in the right apex (series 11 image 25). This is unchanged from 12/22/2023. No new focal consolidation. No pleural effusion or pneumothorax. SOFT TISSUES AND BONES: No acute bone or soft tissue abnormality. 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 ABDOMEN AND PELVIS: LIVER: The liver is unremarkable. GALLBLADDER AND BILE DUCTS: Gallbladder is unremarkable. Unchanged dilation of the common bile duct measuring 12 mm. SPLEEN: Spleen demonstrates no acute abnormality. PANCREAS: Pancreatic head mass seen on MRI is not well visualized via CT. No ductal dilation or peripancreatic inflammatory stranding. ADRENAL GLANDS: Adrenal glands demonstrate no acute abnormality. KIDNEYS, URETERS AND BLADDER: No stones in the kidneys or ureters. No hydronephrosis. No perinephric or periureteral stranding. Urinary bladder is unremarkable. GI AND BOWEL: Percutaneous gastrostomy tube in the stomach. The previous percutaneous jejunostomy catheter has been removed. There is a multiloculated peripherally enhancing fluid collection in the left peritoneum extending into the left abdominal oblique musculature at the site of  the prior jejunostomy. The fluid collection measures 5.2 x 4.0 cm in the axial plane. There is a soft tissue tract containing gas and fluid from the loculated fluid collection extending to the skin surface (series 5 image [id number]). Subcutaneous fat stranding adjacent to the fluid collection. The fluid collection closely abuts the jejunum. If there is concern for contained perforation, CT with IV and oral contrast is recommended. Stomach and duodenal sweep demonstrate no acute abnormality. There  is no bowel obstruction. REPRODUCTIVE: No acute abnormality. PERITONEUM AND RETROPERITONEUM: No free air. Similar infiltrative soft tissue in the omentum. Previously seen peritoneal nodule in the left lower quadrant is not well visualized and may be obscured by the calculated fluid collection. LYMPH NODES: No lymphadenopathy. BONES AND SOFT TISSUES: Unchanged chronic compression fracture of L3. Diffuse sclerotic metastases in the thoracolumbar spine and pelvis. IMPRESSION: 1. Multiloculated peripherally enhancing fluid collection in the left peritoneum extending into the left abdominal oblique musculature at the site of the prior jejunostomy with a soft tissue tract extending to the skin surface. The fluid collection closely abuts the jejunum. If there is concern for contained perforation, CT with IV and oral contrast is recommended. 2. 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 3. Known pancreatic head mass not well visualized. 4. Similar peritoneal carcinomatosis. 5. Similar indeterminate cluster nodularity in the bilateral upper lobes. This may be infectious/inflammatory or due to metastases. 6. No pulmonary embolism. Electronically signed by: Norman Gatlin MD 02/27/2024 10:00 PM EDT RP Workstation: [id number]   CT ABDOMEN PELVIS W CONTRAST Result Date: 02/27/2024 EXAM: CTA CHEST PE WITHOUT AND WITH CONTRAST CT ABDOMEN AND PELVIS WITHOUT AND WITH CONTRAST 02/27/2024 09:35:50 PM TECHNIQUE: CTA of the  chest was performed after the administration of intravenous contrast. Multiplanar reformatted images are provided for review. MIP images are provided for review. CT of the abdomen and pelvis was performed with the administration of intravenous contrast. Automated exposure control, iterative reconstruction, and/or weight based adjustment of the mA/kV was utilized to reduce the radiation dose to as low as reasonably achievable. COMPARISON: CT abdomen and pelvis 02/02/2024; x-ray 02/27/2024 and CT 12/22/2023. CLINICAL HISTORY: Pulmonary embolism (PE) suspected, high prob. Pt arrives via wheelchair from the cancer center. PT was being seen today for a scheduled visit, and was found to be TACHYcardic 130+, and a low hemoglobin. Pt refused admission, but agreed to come to the ED for a transfusion. Pt very adamant that she not be admitted to the hospital during triage. Port accessed prior to arrival. Sepsis. FINDINGS: CHEST: PULMONARY ARTERIES: Pulmonary arteries are adequately opacified for evaluation. No intraluminal filling defect to suggest pulmonary embolism. Main pulmonary artery is normal in caliber. MEDIASTINUM: No mediastinal lymphadenopathy. The heart and pericardium demonstrate no acute abnormality. There is no acute abnormality of the thoracic aorta. LUNGS AND PLEURA: Similar appearing bilateral upper lobe clustered centrilobular nodules. The largest nodule measures 6 mm in the right apex (series 11 image 25). This is unchanged from 12/22/2023. No new focal consolidation. No pleural effusion or pneumothorax. SOFT TISSUES AND BONES: No acute bone or soft tissue abnormality. 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 ABDOMEN AND PELVIS: LIVER: The liver is unremarkable. GALLBLADDER AND BILE DUCTS: Gallbladder is unremarkable. Unchanged dilation of the common bile duct measuring 12 mm. SPLEEN: Spleen demonstrates no acute abnormality. PANCREAS: Pancreatic head mass seen on MRI is not well  visualized via CT. No ductal dilation or peripancreatic inflammatory stranding. ADRENAL GLANDS: Adrenal glands demonstrate no acute abnormality. KIDNEYS, URETERS AND BLADDER: No stones in the kidneys or ureters. No hydronephrosis. No perinephric or periureteral stranding. Urinary bladder is unremarkable. GI AND BOWEL: Percutaneous gastrostomy tube in the stomach. The previous percutaneous jejunostomy catheter has been removed. There is a multiloculated peripherally enhancing fluid collection in the left peritoneum extending into the left abdominal oblique musculature at the site of the prior jejunostomy. The fluid collection measures 5.2 x 4.0 cm in the axial plane. There is a soft tissue tract containing  gas and fluid from the loculated fluid collection extending to the skin surface (series 5 image [id number]). Subcutaneous fat stranding adjacent to the fluid collection. The fluid collection closely abuts the jejunum. If there is concern for contained perforation, CT with IV and oral contrast is recommended. Stomach and duodenal sweep demonstrate no acute abnormality. There is no bowel obstruction. REPRODUCTIVE: No acute abnormality. PERITONEUM AND RETROPERITONEUM: No free air. Similar infiltrative soft tissue in the omentum. Previously seen peritoneal nodule in the left lower quadrant is not well visualized and may be obscured by the calculated fluid collection. LYMPH NODES: No lymphadenopathy. BONES AND SOFT TISSUES: Unchanged chronic compression fracture of L3. Diffuse sclerotic metastases in the thoracolumbar spine and pelvis. IMPRESSION: 1. Multiloculated peripherally enhancing fluid collection in the left peritoneum extending into the left abdominal oblique musculature at the site of the prior jejunostomy with a soft tissue tract extending to the skin surface. The fluid collection closely abuts the jejunum. If there is concern for contained perforation, CT with IV and oral contrast is recommended. 2. Diffuse  axial and appendicular sclerotic osseous metastases. 3. Known pancreatic head mass not well visualized. 4. Similar peritoneal carcinomatosis. 5. Similar indeterminate cluster nodularity in the bilateral upper lobes. This may be infectious/inflammatory or due to metastases. 6. No pulmonary embolism. Electronically signed by: Norman Gatlin MD 02/27/2024 10:00 PM EDT RP Workstation: [id number]   DG Chest Port 1 View Result Date: 02/27/2024 CLINICAL DATA:  Questionable sepsis - evaluate for abnormality. Tachycardia. EXAM: PORTABLE CHEST 1 VIEW COMPARISON:  02/03/2024 FINDINGS: Right Port-A-Cath in place with the tip at the cavoatrial junction. Heart and mediastinal contours are within normal limits. No focal opacities or effusions. No acute bony abnormality. IMPRESSION: No active disease. Electronically Signed   By: Franky Crease M.D.   On: 02/27/2024 19:02     Procedures   Medications Ordered in the ED  metroNIDAZOLE  (FLAGYL ) IVPB 500 mg (500 mg Intravenous New Bag/Given 02/27/24 2210)  vancomycin  (VANCOCIN ) IVPB 1000 mg/200 mL premix (1,000 mg Intravenous New Bag/Given 02/27/24 2156)  magnesium  sulfate IVPB 2 g 50 mL (2 g Intravenous New Bag/Given 02/27/24 2208)  potassium chloride  10 mEq in 100 mL IVPB (has no administration in time range)  0.9 %  sodium chloride  infusion ( Intravenous New Bag/Given 02/27/24 2136)  lactated ringers  bolus 2,000 mL (2,000 mLs Intravenous New Bag/Given 02/27/24 1955)  ceFEPIme  (MAXIPIME ) 2 g in sodium chloride  0.9 % 100 mL IVPB (2 g Intravenous New Bag/Given 02/27/24 1948)  HYDROmorphone  (DILAUDID ) injection 1 mg (1 mg Intravenous Given 02/27/24 1949)  0.9 %  sodium chloride  infusion (Manually program via Guardrails IV Fluids) ( Intravenous New Bag/Given 02/27/24 2110)  ondansetron  (ZOFRAN ) injection 4 mg (4 mg Intravenous Given 02/27/24 2143)  acetaminophen  (OFIRMEV ) IV 1,000 mg (1,000 mg Intravenous New Bag/Given 02/27/24 2142)  iohexol  (OMNIPAQUE ) 350 MG/ML injection 100 mL (100 mLs  Intravenous Contrast Given 02/27/24 2122)                                    Medical Decision Making Amount and/or Complexity of Data Reviewed Labs: ordered. Radiology: ordered.  Risk Prescription drug management.   This patient presents to the ED for concern of fever and tachycardia, this involves an extensive number of treatment options, and is a complaint that carries with it a high risk of complications and morbidity.  The differential diagnosis includes infection, anemia, dehydration, side effects of chemotherapy,  metabolic derangement   Co morbidities / Chronic conditions that complicate the patient evaluation  breast cancer, HTN, HLD, GERD, anxiety, depression, asthma, VTE   Additional history obtained:  Additional history obtained from EMR External records from outside source obtained and reviewed including N/A   Lab Tests:  I Ordered, and personally interpreted labs.  The pertinent results include: Acute on chronic anemia, chronic leukopenia, hypokalemia and hypomagnesemia are present   Imaging Studies ordered:  I ordered imaging studies including chest x-ray, CTA chest, CT of abdomen and pelvis I independently visualized and interpreted imaging which showed multiloculated peripherally enhancing fluid collection in the left peritoneum extending into abdominal oblique musculature at site of prior jejunostomy. I agree with the radiologist interpretation   Cardiac Monitoring: / EKG:  The patient was maintained on a cardiac monitor.  I personally viewed and interpreted the cardiac monitored which showed an underlying rhythm of: Sinus rhythm   Problem List / ED Course / Critical interventions / Medication management  Patient presenting from oncology office for fever, tachycardia, and laboratory abnormalities.  On arrival in the ED, tachycardia has improved.  On exam, she has a G-tube in place with surrounding dressing soaked and appears to be purulent discharge.   This is likely source of infection.  Septic workup and broad-spectrum antibiotics were initiated.  Patient's lab work is notable for an acute on chronic anemia without any known recent blood loss.  Patient was consented for blood transfusion.  1 unit PRBCs ordered.  At baseline, patient takes OxyContin  and fentanyl  patches for pain.  Will give dose of Dilaudid  for worsened pain at this time.  IV fluids ordered for hydration.  Patient's lab work shows redemonstration of acute on chronic anemia, chronic leukopenia, hypokalemia, and hypomagnesemia.  Replacing electrolytes were ordered.  She had recurrence of here in the ED and IV Tylenol  was ordered.  She has required Zofran  for onset of nausea.  CT imaging showed multiloculated peripheral enhancing fluid collection in the peritoneum extending into musculature at site of prior jejunostomy.  I spoke with general surgeon on-call, Dr. Vanderbilt, who recommends antibiotics, bowel rest, hospitalist admission.  General surgery to see in the morning.  He requests secure chat to general surgery PA in the morning from hospitalist.  I updated patient.  She is understandably upset.  She had plans for travel tomorrow.  She does report that her pain and nausea have resolved.  Her fever has resolved as well.  Patient to be admitted for further management. I ordered medication including IV fluids and broad-spectrum antibiotics for empiric treatment of sepsis; Tylenol  for antipyresis; Dilaudid  for analgesia; Zofran  for nausea; potassium chloride  for hypokalemia; magnesium  sulfate for hypomagnesemia Reevaluation of the patient after these medicines showed that the patient improved I have reviewed the patients home medicines and have made adjustments as needed   Consultations Obtained:  I requested consultation with the general surgeon, Dr. Vanderbilt,  and discussed lab and imaging findings as well as pertinent plan - they recommend: Admission to hospitalist, general surgery to see  in the morning.   Social Determinants of Health:  Has access to outpatient care  CRITICAL CARE Performed by: Bernardino Fireman   Total critical care time: 32 minutes  Critical care time was exclusive of separately billable procedures and treating other patients.  Critical care was necessary to treat or prevent imminent or life-threatening deterioration.  Critical care was time spent personally by me on the following activities: development of treatment plan with patient and/or surrogate  as well as nursing, discussions with consultants, evaluation of patient's response to treatment, examination of patient, obtaining history from patient or surrogate, ordering and performing treatments and interventions, ordering and review of laboratory studies, ordering and review of radiographic studies, pulse oximetry and re-evaluation of patient's condition.      Final diagnoses:  Intra-abdominal abscess post-procedure (HCC)  Hypokalemia  Hypomagnesemia  Symptomatic anemia    ED Discharge Orders     None          Melvenia Motto, MD 02/27/24 2250

## 2024-02-27 NOTE — ED Notes (Signed)
 This RN cleaned and did dressing change on her g-tube, G tube site leaking and looks infected. Provider aware.

## 2024-02-27 NOTE — Patient Instructions (Signed)
 Rehydration, Adult Rehydration is the replacement of fluids, salts, and minerals in the body (electrolytes) that are lost during dehydration. Dehydration is when there is not enough water or other fluids in the body. This happens when you lose more fluids than you take in. Common causes of dehydration include: Not drinking enough fluids. This can occur when you are ill or doing activities that require a lot of energy, especially in hot weather. Conditions that cause loss of water or other fluids. These include diarrhea, vomiting, sweating, and urinating a lot. Other illnesses, such as fever or infection. Certain medicines, such as those that remove excess fluid from the body (diuretics). Symptoms of mild or moderate dehydration may include thirst, dry lips and mouth, and dizziness. Symptoms of severe dehydration may include increased heart rate, confusion, fainting, and not urinating. In severe cases, you may need to get fluids through an IV at the hospital. For mild or moderate cases, you can usually rehydrate at home by drinking certain fluids as told by your health care provider. What are the risks? Your health care provider will talk with you about risks. Your health care provider will talk with you about risks. This may include taking in too much fluid (overhydration). This is rare. Overhydration can cause an imbalance of electrolytes in the body, kidney failure, or a decrease in salt (sodium) levels in the body. Supplies needed: You will need an oral rehydration solution (ORS) if your health care provider tells you to use one. This is a drink to treat dehydration. It can be found in pharmacies and retail stores. How to rehydrate Fluids Follow instructions from your health care provider about what to drink. The kind of fluid and the amount you should drink depend on your condition. In general, you should choose drinks that you prefer. If told by your health care provider, drink an ORS. Make an  ORS by following instructions on the package. Start by drinking small amounts, about  cup (120 mL) every 5-10 minutes. Slowly increase how much you drink until you have taken in the amount recommended by your health care provider. Drink enough clear fluids to keep your urine pale yellow. If you were told to drink an ORS, finish it first, then start slowly drinking other clear fluids. Drink fluids such as: Water. This includes sparkling and flavored water. Drinking only water can lead to having too little sodium in your body (hyponatremia). Follow the advice of your health care provider. Water from ice chips you suck on. Fruit juice with water added to it (diluted). Sports drinks. Hot or cold herbal teas. Broth-based soups. Milk or milk products. Food Follow instructions from your health care provider about what to eat while you rehydrate. Your health care provider may recommend that you slowly begin eating regular foods in small amounts. Eat foods that contain a healthy balance of electrolytes, such as bananas, oranges, potatoes, tomatoes, and spinach. Avoid foods that are greasy or contain a lot of sugar. In some cases, you may get nutrition through a feeding tube that is passed through your nose and into your stomach (nasogastric tube, or NG tube). This may be done if you have uncontrolled vomiting or diarrhea. Drinks to avoid  Certain drinks may make dehydration worse. While you rehydrate, avoid drinking alcohol. How to tell if you are recovering from dehydration You may be getting better if: You are urinating more often than before you started rehydrating. Your urine is pale yellow. Your energy level improves. You vomit less  often. You have diarrhea less often. Your appetite improves or returns to normal. You feel less dizzy or light-headed. Your skin tone and color start to look more normal. Follow these instructions at home: Take over-the-counter and prescription medicines only  as told by your health care provider. Do not take sodium tablets. Doing this can lead to having too much sodium in your body (hypernatremia). Contact a health care provider if: You continue to have symptoms of mild or moderate dehydration, such as: Thirst. Dry lips. Slightly dry mouth. Dizziness. Dark urine or less urine than normal. Muscle cramps. You continue to vomit or have diarrhea. Get help right away if: You have symptoms of dehydration that get worse. You have a fever. You have a severe headache. You have been vomiting and have problems, such as: Your vomiting gets worse or does not go away. Your vomit includes blood or green matter (bile). You cannot eat or drink without vomiting. You have problems with urination or bowel movements, such as: Diarrhea that gets worse or does not go away. Blood in your stool (feces). This may cause stool to look black and tarry. Not urinating, or urinating only a small amount of very dark urine, within 6-8 hours. You have trouble breathing. You have symptoms that get worse with treatment. These symptoms may be an emergency. Get help right away. Call 911. Do not wait to see if the symptoms will go away. Do not drive yourself to the hospital. This information is not intended to replace advice given to you by your health care provider. Make sure you discuss any questions you have with your health care provider. Document Revised: 10/19/2021 Document Reviewed: 10/19/2021 Elsevier Patient Education  2024 [employer].

## 2024-02-27 NOTE — Progress Notes (Signed)
 CRITICAL VALUE STICKER  CRITICAL VALUE: hgb 6.9  RECEIVER (on-site recipient of call): Kim RN  DATE & TIME NOTIFIED: 4:03 pm 02/27/2024  MESSENGER (representative from lab): Aldona  MD NOTIFIED:  Morna Kendall, NP  TIME OF NOTIFICATION:  4:05 pm  RESPONSE: aware.

## 2024-02-27 NOTE — ED Triage Notes (Signed)
 Pt arrives via wheelchair from the cancer center. PT was being seen today for a scheduled visit, and was found to be TACHYcardic 130+, and a low hemoglobin. Pt refused admission, but agreed to come to the ED for a transfusion.  Pt very adamant that she not be admitted tot he hospital during triage.   Port accessed prior to arrival.

## 2024-02-27 NOTE — Progress Notes (Signed)
 Patient came in today for fluids. Received notification from provider of pt fever and tachycardia. Per Morna, pt refused to go to ED. Patient brought back to infusion, labs drawn, fluids started. Hgb 6.9 so pt sent to ED to receive blood transfusion. Report given to ED nurse, pt had no other concerns at this time.

## 2024-02-27 NOTE — Progress Notes (Signed)
 [facility] Cancer Follow up:    [name], NP [id number] [place] [address]   DIAGNOSIS: Cancer Staging  Breast cancer [facility]) Staging form: Breast, AJCC 8th Edition - Clinical stage from 12/17/2020: Stage IA (cT1b, cN0, cM0, G2, ER+, PR+, HER2-) - Signed by [name], NP on 01/14/2022 Stage prefix: Initial diagnosis Histologic grading system: 3 grade system Laterality: Left Staged by: Pathologist and managing physician Stage used in treatment planning: Yes National guidelines used in treatment planning: Yes Type of national guideline used in treatment planning: NCCN - Pathologic stage from 07/09/2021: ypTis (DCIS), ypN0, cM0 - Signed by [name], NP on 07/22/2021 Stage prefix: Post-therapy  Malignant neoplasm of upper-outer quadrant of right breast in female, estrogen receptor positive (HCC) Staging form: Breast, AJCC 8th Edition - Clinical stage from 12/17/2020: Stage IIA (cT2, cN1, cM0, G2, ER+, PR+, HER2-) - Signed by [name], NP on 01/14/2022 Stage prefix: Initial diagnosis Histologic grading system: 3 grade system Laterality: Right Staged by: Pathologist and managing physician Stage used in treatment planning: Yes National guidelines used in treatment planning: Yes Type of national guideline used in treatment planning: NCCN - Pathologic stage from 07/09/2021: ypT2, ypN1a, cM0, G2 - Signed by [name], NP on 07/22/2021 Stage prefix: Post-therapy Histologic grading system: 3 grade system - Pathologic: Stage IV (cM1) - Signed by [name], NP on 12/07/2022    SUMMARY OF ONCOLOGIC HISTORY: Oncology History  Malignant neoplasm of upper-outer quadrant of right breast in female, estrogen receptor positive (HCC)  12/12/2020 Initial Diagnosis   status post bilateral breast biopsies 12/03/2020, showing             (1) on the right, a clinical T2 N1, stage IIa invasive lobular  carcinoma, grade 2, estrogen and progesterone receptor strongly positive, HER2 not amplified, with an MIB-1 of 40%                         (a) the biopsied right axillary lymph node was positive with extracapsular extension                         (b) a second right breast mass also biopsied was a fibroadenoma, concordant  MAMMAPRINT tested on biopsy returned high risk, luminal type B, indicating significant benefit from chemo                         (c) biopsy of an area of non-mass-like enhancement in the upper right breast pending   12/17/2020 Cancer Staging   Staging form: Breast, AJCC 8th Edition - Clinical stage from 12/17/2020: Stage IIA (cT2, cN1, cM0, G2, ER+, PR+, HER2-) - Signed by [name], NP on 01/14/2022 Stage prefix: Initial diagnosis Histologic grading system: 3 grade system Laterality: Right Staged by: Pathologist and managing physician Stage used in treatment planning: Yes National guidelines used in treatment planning: Yes Type of national guideline used in treatment planning: NCCN   12/24/2020 Genetic Testing   Negative genetic testing:  No pathogenic variants detected on the [employer] BRCAplus panel (report date 12/24/2020) or the CancerNext-Expanded + RNAinsight panel (report date 12/31/2020). 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   The BRCAplus panel offered by [employer] and includes sequencing and deletion/duplication analysis for the following 8 genes: ATM, BRCA1, BRCA2, CDH1, CHEK2, PALB2, PTEN, and  TP53. The CancerNext-Expanded + RNAinsight gene panel offered by [employer] and includes sequencing and rearrangement analysis for the following 77 genes: AIP, ALK, APC, ATM, AXIN2, BAP1, BARD1, BLM, BMPR1A, BRCA1, BRCA2, BRIP1, CDC73, CDH1, CDK4, CDKN1B, CDKN2A, CHEK2, CTNNA1, DICER1, FANCC, FH, FLCN, GALNT12, KIF1B, LZTR1, MAX, MEN1, MET, MLH1, MSH2, MSH3, MSH6, MUTYH, NBN, NF1, NF2, NTHL1, PALB2,  PHOX2B, PMS2, POT1, PRKAR1A, PTCH1, PTEN, RAD51C, RAD51D, RB1, RECQL, RET, SDHA, SDHAF2, SDHB, SDHC, SDHD, SMAD4, SMARCA4, SMARCB1, SMARCE1, STK11, SUFU, TMEM127, TP53, TSC1, TSC2, VHL and XRCC2 (sequencing and deletion/duplication); EGFR, EGLN1, HOXB13, KIT, MITF, PDGFRA, POLD1 and POLE (sequencing only); EPCAM and GREM1 (deletion/duplication only). RNA data is routinely analyzed for use in variant interpretation for all genes.   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 Multifocal sclerotic bony metastatic disease greatest in the thoracic spine with scattered rib lesions and sclerosis as well. 2. No nodal or visceral metastasis about the chest. 3. Collateral pathways about the chest suggest some subclavian venous narrowing on the LEFT. This could also be related to arm position. Correlate clinically. 4. Signs of RIGHT breast lumpectomy. Potential postoperative changes in the LEFT breast with small focal area of nodularity in the lateral LEFT breast  which could also be postoperative, correlate clinically and consider mammographic correlation as warranted. 5. Mild cardiac enlargement.   05/18/2022 Treatment Plan Change   Anastrozole  + Verzenio  100mg  PO BID   12/07/2022 Cancer Staging   Staging form: Breast, AJCC 8th Edition - Pathologic: Stage IV (cM1) - Signed by [name], NP on 12/07/2022   02/16/2024 -  Chemotherapy   Patient is on Treatment Plan : BREAST Fam-Trastuzumab Deruxtecan-nxki  (Enhertu ) (5.4) q21d     Breast cancer (HCC)  12/12/2020 Initial Diagnosis   on the left, a clinical T1b N0, stage IA invasive ductal carcinoma, grade 1 or 2, estrogen and progesterone receptor positive, HER2 not amplified, with an MIB 1 of 15%   12/17/2020 Cancer Staging   Staging form: Breast, AJCC 8th Edition - Clinical stage from 12/17/2020: Stage IA (cT1b, cN0, cM0, G2, ER+, PR+, HER2-) - Signed by [name], NP on 01/14/2022 Stage prefix: Initial diagnosis Histologic grading system: 3 grade system Laterality: Left Staged by: Pathologist and managing physician Stage used in treatment planning: Yes National guidelines used in treatment planning: Yes Type of national guideline used in treatment planning: NCCN   12/24/2020 Genetic Testing   Negative genetic testing:  No pathogenic variants detected on the [employer] BRCAplus panel (report date 12/24/2020) or the CancerNext-Expanded + RNAinsight panel (report date 12/31/2020). A variant of uncertain  significance (VUS) was detected in the ATM gene called p.D44G (c.131A>G).   The BRCAplus panel offered by [employer] and includes sequencing and deletion/duplication analysis for the following 8 genes: ATM, BRCA1, BRCA2, CDH1, CHEK2, PALB2, PTEN, and TP53. The CancerNext-Expanded + RNAinsight gene panel offered by [employer] and includes sequencing and rearrangement analysis for the following 77 genes: AIP, ALK, APC, ATM, AXIN2, BAP1, BARD1, BLM, BMPR1A, BRCA1, BRCA2, BRIP1,  CDC73, CDH1, CDK4, CDKN1B, CDKN2A, CHEK2, CTNNA1, DICER1, FANCC, FH, FLCN, GALNT12, KIF1B, LZTR1, MAX, MEN1, MET, MLH1, MSH2, MSH3, MSH6, MUTYH, NBN, NF1, NF2, NTHL1, PALB2, PHOX2B, PMS2, POT1, PRKAR1A, PTCH1, PTEN, RAD51C, RAD51D, RB1, RECQL, RET, SDHA, SDHAF2, SDHB, SDHC, SDHD, SMAD4, SMARCA4, SMARCB1, SMARCE1, STK11, SUFU, TMEM127, TP53, TSC1, TSC2, VHL and XRCC2 (sequencing and deletion/duplication); EGFR, EGLN1, HOXB13, KIT, MITF, PDGFRA, POLD1 and POLE (sequencing only); EPCAM and GREM1 (deletion/duplication only). RNA data is routinely analyzed for use in variant interpretation for all genes.   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 Multifocal sclerotic bony metastatic disease greatest in the thoracic spine with scattered rib lesions and sclerosis as well. 2. No nodal or visceral metastasis about the chest. 3. Collateral pathways about the chest suggest some subclavian venous narrowing on the LEFT. This could also be related to arm position. Correlate clinically. 4. Signs of RIGHT  breast lumpectomy. Potential postoperative changes in the LEFT breast with small focal area of nodularity in the lateral LEFT breast which could also be postoperative, correlate clinically and consider mammographic correlation as warranted. 5. Mild cardiac enlargement.   05/18/2022 Treatment Plan Change   Anastrozole  + Verzenio  100mg  PO BID   06/23/2022 - 07/07/2022 Radiation Therapy   06/23/2022 through 07/07/2022 Site Technique Total Dose (Gy) Dose per Fx (Gy) Completed Fx Beam Energies  Lumbar Spine: Spine_LS_Pelv 3D 30/30 3 10/10 15X       CURRENT THERAPY:  INTERVAL HISTORY:  Discussed the use of AI scribe software for clinical note transcription with the patient, who gave verbal consent to proceed.  History of Present Illness Chelsea Hansen is a 53 year old female with metastatic breast cancer who presents with fever and nausea.  She is undergoing HER2-targeted therapy, with the last session on February 16, 2024. Fever and nausea have been present, along with lightheadedness for the past two days. She is on oral antibiotics, Bactrim  DS and Cipro , since Thursday, but fevers persist. Blood cultures have shown no growth. Generalized body pain is noted.  She vomited last night and takes nausea medication twice daily. Her appetite is reduced, with minimal intake and some vomiting today. Bowel movements are irregular,  with the last one two days ago. She lives with her daughter and is concerned about exposure to illness, especially with children around, as school has just started.  She has pain in her back and takes oxycodone  for it.       Patient Active Problem List   Diagnosis Date Noted   Gastrostomy tube present (HCC) 02/10/2024   Gastrostomy site leak (HCC) 02/10/2024   Irritant contact dermatitis associated with digestive stoma 02/10/2024   Protein-calorie malnutrition, severe 02/03/2024   SBO (small bowel obstruction) (HCC) 02/01/2024   Generalized abdominal pain 01/17/2024    Elevated CA 19-9 level 01/16/2024   Hypophosphatemia 01/04/2024   Hypomagnesemia 01/04/2024   Pancreatic lesion 12/29/2023   Unintentional weight loss 12/29/2023   Nausea and vomiting 12/29/2023   Duodenal obstruction 12/29/2023   Abnormal MRI of abdomen 12/26/2023   Abnormal CT of the abdomen 12/23/2023   Elevated liver enzymes 12/23/2023   Acute kidney injury (HCC) 12/23/2023   AKI (acute kidney injury) (HCC) 12/22/2023   Transaminitis 12/22/2023   Pancreatic mass 12/22/2023   Right sided weakness 12/22/2023   Hypotension 12/22/2023   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   Abnormal CT scan, colon 12/14/2023   Acute esophagitis 12/14/2023   Candida esophagitis (HCC) 12/14/2023   Therapeutic opioid-induced constipation (OIC) 12/11/2023   Hypocalcemia 12/10/2023   Fever of unknown origin 12/10/2023   Sinus tachycardia 12/10/2023   Acute kidney injury superimposed on chronic kidney disease (HCC) 12/05/2023   Electrolyte abnormality 12/05/2023   Dehydration 12/05/2023   Prolonged QT interval 12/05/2023   Intractable nausea and vomiting 11/24/2023   Neoplasm related pain 12/07/2022   Constipation 06/30/2022   Nausea and vomiting in adult 06/29/2022   Hematemesis 06/28/2022   Breast cancer metastasized to bone, right (HCC) 05/20/2022   Supraventricular tachycardia (HCC) 07/08/2021   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   GERD (gastroesophageal reflux disease) 05/27/2021   HLD (hyperlipidemia) 05/27/2021   Essential hypertension 05/27/2021   Pain management contract signed 05/04/2021   Chronic pain syndrome (breast cancer) 05/04/2021   Palpitations 03/25/2021   Sinus bradycardia 03/25/2021   Daytime somnolence 03/25/2021   Snoring 03/25/2021   Obesity (BMI 30-39.9) 03/25/2021   Port-A-Cath in place 01/27/2021   Genetic testing 12/24/2020   Family history of uterine cancer 12/17/2020   Malignant neoplasm of upper-outer quadrant of right breast in female, estrogen receptor  positive (HCC) 12/12/2020   Breast cancer (HCC) 12/12/2020   Hypokalemia 01/02/2012   Chronic asthma 01/01/2012   Bradycardia 01/01/2012   Syncope 12/31/2011   Mediastinal adenopathy 12/31/2011   Anemia 12/31/2011   Chest pain 12/31/2011    is allergic to enhertu  [fam-trastuzumab  deruxtec-nxki].  MEDICAL HISTORY: Past Medical History:  Diagnosis Date   Allergy    seasonal allergies   Anxiety    on meds   Asthma    uses inhaler   Breast cancer (HCC) 2012   RIGHT lumpectomy   Cancer (HCC) 2022   RIGHT breast lump-dx 2022   Depression    on meds   DVT (deep venous thrombosis) (HCC) 2010   after hysterectomy   Family history of uterine cancer    GERD (gastroesophageal reflux disease)    with certain foods/OTC PRN meds   Headache(784.0)    History of radiation therapy    Bilateral breast- 09/03/21-11/02/21- Dr. Lynwood Nasuti   Hyperlipidemia    on meds   Hypertension    on meds   SVT (supraventricular tachycardia) (HCC)     SURGICAL HISTORY:  Past Surgical History:  Procedure Laterality Date   ABDOMINAL HYSTERECTOMY     AXILLARY SENTINEL NODE BIOPSY Left 07/09/2021   Procedure: LEFT AXILLARY SENTINEL NODE BIOPSY;  Surgeon: [name], MD;  Location: [facility];  Service: General;  Laterality: Left;   BALLOON DILATION N/A 12/13/2023   Procedure: BALLOON DILATION;  Surgeon: Federico Rosario BROCKS, MD;  Location: [facility]COPY;  Service: Gastroenterology;  Laterality: N/A;   BIOPSY  06/29/2022   Procedure: BIOPSY;  Surgeon: Legrand Victory LITTIE DOUGLAS, MD;  Location: [facility];  Service: Gastroenterology;;   BIOPSY OF SKIN SUBCUTANEOUS TISSUE AND/OR MUCOUS MEMBRANE  12/13/2023   Procedure: BIOPSY, SKIN, SUBCUTANEOUS TISSUE, OR MUCOUS MEMBRANE;  Surgeon: Federico Rosario BROCKS, MD;  Location: [facility] END[facility]COPY;  Service: Gastroenterology;;   BREAST EXCISIONAL BIOPSY Right 09/16/2009   BREAST LUMPECTOMY WITH RADIOACTIVE SEED LOCALIZATION Bilateral 07/09/2021   Procedure:  BILATERAL BREAST LUMPECTOMY WITH RADIOACTIVE SEED LOCALIZATION;  Surgeon: [name], MD;  Location: [facility];  Service: General;  Laterality: Bilateral;   BREAST SURGERY     lumpectomy   ESOPHAGOGASTRODUODENOSCOPY N/A 12/13/2023   Procedure: EGD (ESOPHAGOGASTRODUODENOSCOPY);  Surgeon: Federico Rosario BROCKS, MD;  Location: [facility];  Service: Gastroenterology;  Laterality: N/A;   ESOPHAGOGASTRODUODENOSCOPY N/A 12/26/2023   Procedure: EGD (ESOPHAGOGASTRODUODENOSCOPY);  Surgeon: Wilhelmenia Aloha Raddle., MD;  Location: [facility];  Service: Gastroenterology;  Laterality: N/A;   ESOPHAGOGASTRODUODENOSCOPY N/A 12/27/2023   Procedure: EGD (ESOPHAGOGASTRODUODENOSCOPY);  Surgeon: Wilhelmenia Aloha Raddle., MD;  Location: [facility];  Service: Gastroenterology;  Laterality: N/A;   ESOPHAGOGASTRODUODENOSCOPY (EGD) WITH PROPOFOL  N/A 06/29/2022   Procedure: ESOPHAGOGASTRODUODENOSCOPY (EGD) WITH PROPOFOL ;  Surgeon: Legrand Victory LITTIE DOUGLAS, MD;  Location: [facility];  Service: Gastroenterology;  Laterality: N/A;   EUS N/A 12/26/2023   Procedure: ULTRASOUND, UPPER GI TRACT, ENDOSCOPIC;  Surgeon: Wilhelmenia Aloha Raddle., MD;  Location: [facility];  Service: Gastroenterology;  Laterality: N/A;   EUS N/A 12/27/2023   Procedure: ULTRASOUND, UPPER GI TRACT, ENDOSCOPIC;  Surgeon: Wilhelmenia Aloha Raddle., MD;  Location: [facility];  Service: Gastroenterology;  Laterality: N/A;   IR CM INJ ANY COLONIC TUBE W/FLUORO  01/20/2024   IR IMAGING GUIDED PORT INSERTION  02/06/2024   IR REPLC DUODEN/JEJUNO TUBE PERCUT W/FLUORO  02/03/2024   LAPAROSCOPIC INSERTION GASTROSTOMY TUBE N/A 01/10/2024   Procedure: laparoscopic insertion of gastrostomy tube, laparoscopic insertion of jejunostomy tube, upper endoscopy;  Surgeon: Kinsinger, Herlene Righter, MD;  Location: [facility];  Service: General;  Laterality: N/A;  Laproscopic insertion   LAPAROSCOPIC ROUX-EN-Y GASTRIC BYPASS WITH HIATAL HERNIA REPAIR N/A 01/02/2024   Procedure:  LAPAROSCOPIC CREATION GASTRJEJONOSTOMY;  Surgeon: Lyndel Deward PARAS, MD;  Location: [facility];  Service: General;  Laterality: N/A;  GASTROJEJUNOSTOMY   PORT-A-CATH REMOVAL Left 07/09/2021   Procedure: REMOVAL PORT-A-CATH;  Surgeon: [name], MD;  Location: [facility];  Service: General;  Laterality: Left;   PORTACATH PLACEMENT Left 01/26/2021   Procedure: INSERTION PORT-A-CATH;  Surgeon: [name], MD;  Location: [facility];  Service: General;  Laterality: Left;   RADIOACTIVE SEED GUIDED AXILLARY SENTINEL LYMPH NODE Right 07/09/2021   Procedure: RADIOACTIVE SEED GUIDED RIGHT AXILLARY SENTINEL LYMPH NODE DISSECTION;  Surgeon: [name], MD;  Location: [facility];  Service: General;  Laterality: Right;   TUBAL LIGATION      SOCIAL HISTORY: Social History   Socioeconomic History   Marital status: Single    Spouse name: Not on file   Number of children: Not on file   Years of education: Not  on file   Highest education level: Not on file  Occupational History   Not on file  Tobacco Use   Smoking status: Never   Smokeless tobacco: Never  Vaping Use   Vaping status: Never Used  Substance and Sexual Activity   Alcohol use: Not Currently    Alcohol/week: 7.0 standard drinks of alcohol    Types: 7 Standard drinks or equivalent per week    Comment: occassional   Drug use: No   Sexual activity: Not Currently  Other Topics Concern   Not on file  Social History Narrative   Not on file   Social Drivers of Health   Financial Resource Strain: High Risk (02/13/2024)   Overall Financial Resource Strain (CARDIA)    Difficulty of Paying Living Expenses: Hard  Food Insecurity: Food Insecurity Present (02/16/2024)   Hunger Vital Sign    Worried About Running Out of Food in the Last Year: Sometimes true    Ran Out of Food in the Last Year: Sometimes true  Transportation Needs: No Transportation Needs (02/13/2024)   PRAPARE - Therapist, art (Medical): No    Lack of Transportation (Non-Medical): No  Physical Activity: Inactive (02/13/2024)   Exercise Vital Sign    Days of Exercise per Week: 0 days    Minutes of Exercise per Session: 0 min  Stress: No Stress Concern Present (02/13/2024)   [employer] of Occupational Health - Occupational Stress Questionnaire    Feeling of Stress: Not at all  Social Connections: Moderately Isolated (02/13/2024)   Social Connection and Isolation Panel    Frequency of Communication with Friends and Family: More than three times a week    Frequency of Social Gatherings with Friends and Family: Once a week    Attends Religious Services: 1 to 4 times per year    Active Member of [employer] or Organizations: No    Attends Banker Meetings: Never    Marital Status: Never married  Intimate Partner Violence: Not At Risk (02/13/2024)   Humiliation, Afraid, Rape, and Kick questionnaire    Fear of Current or Ex-Partner: No    Emotionally Abused: No    Physically Abused: No    Sexually Abused: No    FAMILY HISTORY: Family History  Problem Relation Age of Onset   Breast cancer Mother        75s   Hypertension Father    Uterine cancer Maternal Aunt    Ovarian cancer Maternal Aunt    Breast cancer Maternal Aunt        22s   Breast cancer Cousin 63   Uterine cancer Cousin 50   Colon polyps Neg Hx    Colon cancer Neg Hx    Esophageal cancer Neg Hx    Stomach cancer Neg Hx     Review of Systems  Constitutional:  Positive for fatigue and fever. Negative for appetite change, chills and unexpected weight change.  HENT:   Negative for hearing loss, lump/mass, mouth sores and trouble swallowing.   Eyes:  Negative for eye problems and icterus.  Respiratory:  Negative for chest tightness, cough and shortness of breath.   Cardiovascular:  Negative for chest pain, leg swelling and palpitations.  Gastrointestinal:  Positive for constipation, nausea and vomiting.  Negative for abdominal distention, abdominal pain and diarrhea.  Endocrine: Negative for hot flashes.  Genitourinary:  Negative for difficulty urinating.   Musculoskeletal:  Negative for arthralgias.  Skin:  Negative for itching and  rash.  Neurological:  Positive for dizziness and light-headedness. Negative for extremity weakness, headaches and numbness.  Hematological:  Negative for adenopathy. Does not bruise/bleed easily.  Psychiatric/Behavioral:  Negative for depression. The patient is not nervous/anxious.       PHYSICAL EXAMINATION   Onc Performance Status - 02/27/24 1400       KPS SCALE   KPS % SCORE Normal activity with effort, some s/s of disease          Vitals:   02/27/24 1422  BP: 101/61  Pulse: (!) 135  Resp: 19  Temp: (!) 101 F (38.3 C)  SpO2: 100%    Physical Exam Constitutional:      General: She is not in acute distress.    Appearance: She is ill-appearing. She is not toxic-appearing.  HENT:     Head: Normocephalic and atraumatic.     Mouth/Throat:     Mouth: Mucous membranes are dry.  Cardiovascular:     Rate and Rhythm: Normal rate and regular rhythm.     Pulses: Normal pulses.     Heart sounds: Normal heart sounds.  Pulmonary:     Effort: Pulmonary effort is normal.     Breath sounds: Normal breath sounds.     Comments: Breathing shallow, but regular respiratory rate Abdominal:     General: There is distension.     Comments: Noted g tube and jejunostomy bag with yellow drainage   Neurological:     Mental Status: She is alert.     LABORATORY DATA:  CBC    Component Value Date/Time   WBC 2.8 (L) 02/22/2024 0834   WBC 5.4 02/04/2024 1129   RBC 3.12 (L) 02/22/2024 0834   HGB 8.0 (L) 02/22/2024 0834   HGB 11.5 10/06/2020 1432   HCT 23.4 (L) 02/22/2024 0834   HCT 36.8 10/06/2020 1432   PLT 124 (L) 02/22/2024 0834   PLT 323 10/06/2020 1432   MCV 75.0 (L) 02/22/2024 0834   MCV 73 (L) 10/06/2020 1432   MCH 25.6 (L) 02/22/2024 0834    MCHC 34.2 02/22/2024 0834   RDW 19.9 (H) 02/22/2024 0834   RDW 15.9 (H) 10/06/2020 1432   LYMPHSABS 0.9 02/22/2024 0834   LYMPHSABS 1.9 10/06/2020 1432   MONOABS 0.2 02/22/2024 0834   EOSABS 0.0 02/22/2024 0834   EOSABS 0.1 10/06/2020 1432   BASOSABS 0.0 02/22/2024 0834   BASOSABS 0.0 10/06/2020 1432    CMP     Component Value Date/Time   NA 135 02/22/2024 0834   NA 144 11/30/2023 1000   K 3.2 (L) 02/22/2024 0834   CL 101 02/22/2024 0834   CO2 25 02/22/2024 0834   GLUCOSE 107 (H) 02/22/2024 0834   BUN 9 02/22/2024 0834   BUN 19 11/30/2023 1000   CREATININE 0.33 (L) 02/22/2024 0834   CALCIUM  7.5 (L) 02/22/2024 0834   PROT 6.6 02/22/2024 0834   PROT 7.3 11/30/2023 1000   ALBUMIN  3.2 (L) 02/22/2024 0834   ALBUMIN  4.5 11/30/2023 1000   AST 82 (H) 02/22/2024 0834   ALT 167 (H) 02/22/2024 0834   ALKPHOS 1,109 (H) 02/22/2024 0834   BILITOT 0.6 02/22/2024 0834   GFRNONAA >60 02/22/2024 0834   GFRAA >60 09/07/2019 1340     ASSESSMENT and THERAPY PLAN:   Assessment and Plan Assessment & Plan Metastatic breast cancer on active HER2-targeted therapy Undergoing Enhertu  therapy with recent symptoms of fever, nausea, vomiting, and generalized pain, likely related to cancer or treatment. - continue Antinausea medications.  Fever in immunocompromised host on chemotherapy Persistent fever despite oral antibiotics, with concern for infection due to immunocompromised status. Blood cultures negative so far. - Order labs for white blood cell count and electrolytes. - Administer IV fluids. - I recommended patient go to ER for evaluation of persistent fever despite oral antibiotics and having lines including port, G tube, and recent jejunostomy.  Patient declined.   -Reviewed with Dr. [name] who noted patient can forego ER visit unless her CBC results with her being neutropenic.   - Order chest x-ray post fluids and labs.  Follow-Up Advised against traveling due to symptoms and  risk of complications. - Advise against traveling until symptoms improve   All questions were answered. The patient knows to call the clinic with any problems, questions or concerns. We can certainly see the patient much sooner if necessary.  Total encounter time:45 minutes*in face-to-face visit time, chart review, lab review, care coordination, order entry, and documentation of the encounter time.    Morna Kendall, NP 02/27/24 2:29 PM Medical Oncology and Hematology [facility] [address] [place], [place] [address] Tel. [phone]    Fax. [phone]  *Total Encounter Time as defined by the Centers for Medicare and Medicaid Services includes, in addition to the face-to-face time of a patient visit (documented in the note above) non-face-to-face time: obtaining and reviewing outside history, ordering and reviewing medications, tests or procedures, care coordination (communications with other health care professionals or caregivers) and documentation in the medical record.  ADDENDUM: Patient ended up having hemoglobin of 6.9 and electrolyte abnormalities, so she went to ER for blood transfusion and further work up.

## 2024-02-28 ENCOUNTER — Ambulatory Visit: Payer: MEDICAID

## 2024-02-28 ENCOUNTER — Encounter: Payer: Self-pay | Admitting: Hematology and Oncology

## 2024-02-28 ENCOUNTER — Other Ambulatory Visit: Payer: MEDICAID

## 2024-02-28 ENCOUNTER — Ambulatory Visit: Payer: MEDICAID | Admitting: Adult Health

## 2024-02-28 ENCOUNTER — Encounter (HOSPITAL_COMMUNITY): Payer: Self-pay | Admitting: Family Medicine

## 2024-02-28 DIAGNOSIS — R188 Other ascites: Secondary | ICD-10-CM

## 2024-02-28 LAB — BPAM RBC
Blood Product Expiration Date: 202510042359
ISSUE DATE / TIME: 202509082044
Unit Type and Rh: 7300

## 2024-02-28 LAB — TYPE AND SCREEN
ABO/RH(D): B POS
Antibody Screen: NEGATIVE
Unit division: 0

## 2024-02-28 LAB — CBC WITH DIFFERENTIAL/PLATELET
HCT: 19.9 % — ABNORMAL LOW (ref 36.0–46.0)
Hemoglobin: 6.2 g/dL — CL (ref 12.0–15.0)
Immature Granulocytes: 7 %
Lymphocytes Relative: 34 %
Lymphs Abs: 0.9 K/uL (ref 0.7–4.0)
MCH: 24.8 pg — ABNORMAL LOW (ref 26.0–34.0)
MCHC: 31.2 g/dL (ref 30.0–36.0)
MCV: 79.6 fL — ABNORMAL LOW (ref 80.0–100.0)
Monocytes Absolute: 0 K/uL (ref 0.1–1.0)
Monocytes Absolute: 0.4 K/uL (ref 0.1–1.0)
Monocytes Relative: 0 %
Monocytes Relative: 14 K/uL (ref 0.7–4.0)
Neutro Abs: 1.2 K/uL — AB (ref 1.7–7.7)
Neutrophils Relative %: 45 %
Platelets: 120 K/uL — ABNORMAL LOW (ref 150–400)
RBC Morphology: 0 (ref 0.0–0.1)
RBC: 2.5 MIL/uL — ABNORMAL LOW (ref 3.87–5.11)
RDW: 20 % — ABNORMAL HIGH (ref 11.5–15.5)
Smear Review: 0.19 — AB (ref 0.00–0.07)
Smear Review: NORMAL
WBC Morphology: 0 (ref 0.0–0.5)
WBC: 2.7 K/uL — ABNORMAL LOW (ref 4.0–10.5)
nRBC: 0.7 % — ABNORMAL HIGH (ref 0.0–0.2)

## 2024-02-28 MED ORDER — SODIUM CHLORIDE 0.9% FLUSH
3.0000 mL | Freq: Two times a day (BID) | INTRAVENOUS | Status: DC
  Administered 2024-02-28: 3 mL via INTRAVENOUS

## 2024-02-28 MED ORDER — ACETAMINOPHEN 650 MG RE SUPP: 650.0000 mg | Freq: Four times a day (QID) | RECTAL | Status: DC | PRN

## 2024-02-28 MED ORDER — ONDANSETRON HCL 4 MG PO TABS: 4.0000 mg | ORAL_TABLET | Freq: Four times a day (QID) | ORAL | Status: DC | PRN

## 2024-02-28 MED ORDER — SODIUM CHLORIDE 0.9 % IV SOLN: INTRAVENOUS | Status: DC

## 2024-02-28 MED ORDER — VANCOMYCIN HCL IN DEXTROSE 1-5 GM/200ML-% IV SOLN: 1000.0000 mg | Freq: Two times a day (BID) | INTRAVENOUS | Status: DC

## 2024-02-28 MED ORDER — OXYCODONE HCL 5 MG PO TABS: 15.0000 mg | ORAL_TABLET | ORAL | Status: DC | PRN

## 2024-02-28 MED ORDER — VANCOMYCIN HCL IN DEXTROSE 1-5 GM/200ML-% IV SOLN: 1000.0000 mg | INTRAVENOUS | Status: DC

## 2024-02-28 MED ORDER — POLYETHYLENE GLYCOL 3350 17 G PO PACK: 17.0000 g | PACK | Freq: Every day | ORAL | Status: DC | PRN

## 2024-02-28 MED ORDER — METRONIDAZOLE 500 MG/100ML IV SOLN: 500.0000 mg | Freq: Two times a day (BID) | INTRAVENOUS | Status: DC

## 2024-02-28 MED ORDER — FENTANYL 12 MCG/HR TD PT72: 1.0000 | MEDICATED_PATCH | TRANSDERMAL | Status: DC

## 2024-02-28 MED ORDER — SODIUM CHLORIDE 0.9 % IV SOLN: 2.0000 g | Freq: Three times a day (TID) | INTRAVENOUS | Status: DC

## 2024-02-28 MED ORDER — ONDANSETRON HCL 4 MG/2ML IJ SOLN: 4.0000 mg | Freq: Four times a day (QID) | INTRAMUSCULAR | Status: DC | PRN

## 2024-02-28 MED ORDER — SENNA 8.6 MG PO TABS: 1.0000 | ORAL_TABLET | Freq: Two times a day (BID) | ORAL | Status: DC

## 2024-02-28 MED ORDER — ACETAMINOPHEN 325 MG PO TABS: 650.0000 mg | ORAL_TABLET | Freq: Four times a day (QID) | ORAL | Status: DC | PRN

## 2024-02-28 NOTE — Progress Notes (Signed)
 Pharmacy Antibiotic Note  Chelsea Hansen is a 53 y.o. female admitted on 02/27/2024 with fever, tachycardia, yellow drainage from around gastrostomy tube.  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 Pharmacy has been consulted for Vancomycin  and Cefepime  dosing for unknown source of suspected infection.  Plan: Vancomycin  1000 mg IV Q 12 hrs. Goal AUC 400-550.  Expected AUC: 464.5  SCr used: 0.8 (rounded up from 0.7) Cefepime  2g IV q8h Metronidazole  per MD Follow renal function F/u culture results & sensitivities     Temp (24hrs), Avg:100.2 F (37.9 C), Min:98.7 F (37.1 C), Max:101 F (38.3 C)  Recent Labs  Lab 02/22/24 0834 02/27/24 1531 02/27/24 1845 02/27/24 2025 02/27/24 2027 02/27/24 2227  WBC 2.8* 2.7* 2.7*  --   --   --   CREATININE 0.33* 0.51 0.56  --  0.70  --   LATICACIDVEN  --   --   --  0.4*  --  0.3*    Estimated Creatinine Clearance: 93 mL/min (by C-G formula based on SCr of 0.7 mg/dL).    Allergies  Allergen Reactions   Enhertu  [Fam-Trastuzumab  Deruxtec-Nxki] Other (See Comments)    Headache and chest pain. See progress note from 02/16/2024. Patient able to complete infusion.    Antimicrobials this admission: 9/8 Metronidazole  >>   9/8 Cefepime  >>   9/8 Vancomycin  >>  Dose adjustments this admission:    Microbiology results: 9/8 BCx:      Thank you for allowing pharmacy to be a part of this patient's care.  Kemp Arvin Fletcher, PharmD 02/28/2024 12:53 AM

## 2024-02-28 NOTE — ED Notes (Signed)
 Pt left AMA provider made aware. AMA form signed

## 2024-02-28 NOTE — H&P (Signed)
 History and Physical    NATALIN BIBLE FMW:[id number] DOB: 04/14/1971 DOA: 02/27/2024  PCP: [name], NP   Patient coming from: Home   Chief Complaint: Low Hgb, fever, tachycardia   HPI: Chelsea Hansen is a 53 y.o. 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  Patient reports that she has had thick yellow drainage from around her gastrostomy tube for weeks, has not noticed any recent change in her abdominal pain, and did not realize she was febrile.  She went to the cancer center today for IV fluids, was noted to be febrile and tachycardic, and was also found to have hemoglobin of 6.9.  She has not noticed any melena, hematochezia, or other blood loss.  ED Course: Upon arrival to the ED, patient is found to be febrile to 38.3 C and saturating well on room air with elevated heart rate and stable blood pressure.  Labs are most notable for normal creatinine, potassium 3.4, magnesium  1.6, alkaline phosphatase 811, WBC 2700, hemoglobin 6.2, platelets 120,000, and lactic acid 0.4.  CT is concerning for multiloculated peripherally enhancing fluid collection in the left peritoneum extending into the left abdominal oblique musculature with tract to the skin surface.  Surgery (Dr. Vanderbilt) was consulted by the ED physician, type and screen was performed, blood cultures were collected, and the patient was given 2 L LR, vancomycin , cefepime , Flagyl , magnesium , potassium, Zofran , acetaminophen , and Dilaudid .  Review of Systems:  All other systems reviewed and apart from HPI, are negative.  Past Medical History:  Diagnosis Date   Allergy    seasonal allergies   Anxiety    on meds   Asthma    uses inhaler   Breast cancer (HCC) 2012   RIGHT  lumpectomy   Cancer (HCC) 2022   RIGHT breast lump-dx 2022   Depression    on meds   DVT (deep venous thrombosis) (HCC) 2010   after hysterectomy   Family history of uterine cancer    GERD (gastroesophageal reflux disease)    with certain foods/OTC PRN meds   Headache(784.0)    History of radiation therapy    Bilateral breast- 09/03/21-11/02/21- Dr. Lynwood Nasuti   Hyperlipidemia    on meds   Hypertension    on meds   SVT (supraventricular tachycardia) (HCC)     Past Surgical History:  Procedure Laterality Date   ABDOMINAL HYSTERECTOMY     AXILLARY SENTINEL NODE BIOPSY Left 07/09/2021   Procedure: LEFT AXILLARY SENTINEL NODE BIOPSY;  Surgeon: [name], MD;  Location: [facility];  Service: General;  Laterality: Left;   BALLOON DILATION N/A 12/13/2023   Procedure: MERRILL HODGKIN;  Surgeon: Federico Rosario BROCKS, MD;  Location: [facility];  Service: Gastroenterology;  Laterality: N/A;   BIOPSY  06/29/2022   Procedure: BIOPSY;  Surgeon: Legrand Victory LITTIE DOUGLAS, MD;  Location: [facility];  Service: Gastroenterology;;   BIOPSY OF SKIN SUBCUTANEOUS TISSUE AND/OR MUCOUS MEMBRANE  12/13/2023   Procedure: BIOPSY, SKIN, SUBCUTANEOUS TISSUE, OR MUCOUS MEMBRANE;  Surgeon: Federico Rosario BROCKS, MD;  Location: [facility];  Service: Gastroenterology;;   BREAST EXCISIONAL BIOPSY Right 09/16/2009   BREAST LUMPECTOMY WITH RADIOACTIVE SEED LOCALIZATION Bilateral 07/09/2021   Procedure: BILATERAL BREAST LUMPECTOMY WITH RADIOACTIVE SEED LOCALIZATION;  Surgeon: [name], MD;  Location: [facility];  Service: General;  Laterality: Bilateral;   BREAST SURGERY     lumpectomy   ESOPHAGOGASTRODUODENOSCOPY N/A 12/13/2023   Procedure: EGD (ESOPHAGOGASTRODUODENOSCOPY);  Surgeon: Federico Rosario BROCKS, MD;  Location: [name] END[facility]COPY;  Service: Gastroenterology;  Laterality: N/A;   ESOPHAGOGASTRODUODENOSCOPY N/A 12/26/2023   Procedure: EGD (ESOPHAGOGASTRODUODENOSCOPY);  Surgeon:  Wilhelmenia Aloha Raddle., MD;  Location: [facility];  Service: Gastroenterology;  Laterality: N/A;   ESOPHAGOGASTRODUODENOSCOPY N/A 12/27/2023   Procedure: EGD (ESOPHAGOGASTRODUODENOSCOPY);  Surgeon: Wilhelmenia Aloha Raddle., MD;  Location: [facility];  Service: Gastroenterology;  Laterality: N/A;   ESOPHAGOGASTRODUODENOSCOPY (EGD) WITH PROPOFOL  N/A 06/29/2022   Procedure: ESOPHAGOGASTRODUODENOSCOPY (EGD) WITH PROPOFOL ;  Surgeon: Legrand Victory LITTIE DOUGLAS, MD;  Location: [facility];  Service: Gastroenterology;  Laterality: N/A;   EUS N/A 12/26/2023   Procedure: ULTRASOUND, UPPER GI TRACT, ENDOSCOPIC;  Surgeon: Wilhelmenia Aloha Raddle., MD;  Location: [facility];  Service: Gastroenterology;  Laterality: N/A;   EUS N/A 12/27/2023   Procedure: ULTRASOUND, UPPER GI TRACT, ENDOSCOPIC;  Surgeon: Wilhelmenia Aloha Raddle., MD;  Location: [facility];  Service: Gastroenterology;  Laterality: N/A;   IR CM INJ ANY COLONIC TUBE W/FLUORO  01/20/2024   IR IMAGING GUIDED PORT INSERTION  02/06/2024   IR REPLC DUODEN/JEJUNO TUBE PERCUT W/FLUORO  02/03/2024   LAPAROSCOPIC INSERTION GASTROSTOMY TUBE N/A 01/10/2024   Procedure: laparoscopic insertion of gastrostomy tube, laparoscopic insertion of jejunostomy tube, upper endoscopy;  Surgeon: Kinsinger, Herlene Righter, MD;  Location: [facility];  Service: General;  Laterality: N/A;  Laproscopic insertion   LAPAROSCOPIC ROUX-EN-Y GASTRIC BYPASS WITH HIATAL HERNIA REPAIR N/A 01/02/2024   Procedure: LAPAROSCOPIC CREATION GASTRJEJONOSTOMY;  Surgeon: Lyndel Deward PARAS, MD;  Location: [facility];  Service: General;  Laterality: N/A;  GASTROJEJUNOSTOMY   PORT-A-CATH REMOVAL Left 07/09/2021   Procedure: REMOVAL PORT-A-CATH;  Surgeon: [name], MD;  Location: [facility];  Service: General;  Laterality: Left;   PORTACATH PLACEMENT Left 01/26/2021   Procedure: INSERTION PORT-A-CATH;  Surgeon: [name], MD;  Location: [facility];  Service: General;  Laterality: Left;    RADIOACTIVE SEED GUIDED AXILLARY SENTINEL LYMPH NODE Right 07/09/2021   Procedure: RADIOACTIVE SEED GUIDED RIGHT AXILLARY SENTINEL LYMPH NODE DISSECTION;  Surgeon: [name], MD;  Location: [facility];  Service: General;  Laterality: Right;   TUBAL LIGATION      Social History:   reports that she has never smoked. She has never used smokeless tobacco. She reports that she does not currently use alcohol after a past usage of about 7.0 standard drinks of alcohol per week. She reports that she does not use drugs.  Allergies  Allergen Reactions   Enhertu  [Fam-Trastuzumab  Deruxtec-Nxki] Other (See Comments)    Headache and chest pain. See progress note from 02/16/2024. Patient able to complete infusion.    Family History  Problem Relation Age of Onset   Breast cancer Mother        62s   Hypertension Father    Uterine cancer Maternal Aunt    Ovarian cancer Maternal Aunt    Breast cancer Maternal Aunt        30s   Breast cancer Cousin 95   Uterine cancer Cousin 55   Colon polyps Neg Hx    Colon cancer Neg Hx    Esophageal cancer Neg Hx    Stomach cancer Neg Hx      Prior to Admission medications   Medication Sig Start Date End Date Taking? Authorizing Provider  ciprofloxacin  (CIPRO ) 500 MG tablet Take 1 tablet (500 mg total) by  mouth 2 (two) times daily. Patient not taking: Reported on 02/27/2024 02/22/24   [name], NP  dexamethasone  (DECADRON ) 4 MG tablet Take 2 tablets (8 mg) by mouth daily for 3 days starting the day after chemotherapy. Take with food. 02/08/24   [name], MD  fentaNYL  (DURAGESIC ) 12 MCG/HR Place 1 patch onto the skin every 3 (three) days. 02/08/24   Samtani, Jai-Gurmukh, MD  lidocaine -prilocaine  (EMLA ) cream Apply to affected area once 02/08/24   [name], [name], MD  Multiple Vitamin (MULTIVITAMIN WITH MINERALS) TABS tablet Take 1 tablet by mouth daily. 02/07/24   Samtani, Jai-Gurmukh, MD  ondansetron  (ZOFRAN ) 8 MG tablet Take 1  tablet (8 mg total) by mouth every 8 (eight) hours as needed for nausea or vomiting. Start on the third day after chemotherapy. 02/08/24   [name], MD  oxyCODONE  (ROXICODONE ) 15 MG immediate release tablet Take 1 tablet (15 mg total) by mouth every 4 (four) hours as needed for pain. 02/17/24   [name], Athena N, NP  pantoprazole  (PROTONIX ) 40 MG tablet Take 1 tablet (40 mg total) by mouth 2 (two) times daily. 01/25/24   [name], NP  polyethylene glycol powder (GLYCOLAX /MIRALAX ) 17 GM/SCOOP powder Mix 17 g in 4 oz of liquid and take by mouth 2 (two) times daily as needed. Patient not taking: Reported on 02/27/2024 11/30/23   [name], NP  prochlorperazine  (COMPAZINE ) 10 MG tablet Take 1 tablet (10 mg total) by mouth every 6 (six) hours as needed for nausea or vomiting. 02/08/24   [name], [name], MD  senna (SENOKOT) 8.6 MG TABS tablet Take 2 tablets (17.2 mg total) by mouth 2 (two) times daily. Patient not taking: Reported on 02/27/2024 01/31/24   [name], Athena N, NP  sulfamethoxazole -trimethoprim  (BACTRIM  DS) 800-160 MG tablet Take 1 tablet by mouth 2 (two) times daily. Patient not taking: Reported on 02/27/2024 02/22/24   [name], NP  thiamine  (VITAMIN B1) 100 MG tablet Take 1 tablet (100 mg total) by mouth daily. Patient not taking: Reported on 02/27/2024 02/07/24   Royal Sill, MD    Physical Exam: Vitals:   02/27/24 2051 02/27/24 2117 02/27/24 2202 02/27/24 2230  BP: (!) 107/93 114/74 119/78 107/68  Pulse:  (!) 102  89  Resp:  (!) 22  19  Temp: (!) 101 F (38.3 C) (!) 100.6 F (38.1 C) 99.8 F (37.7 C)   TempSrc: Oral Oral Oral   SpO2:  99%  96%    Constitutional: NAD, pallor, no diaphoresis   Eyes: PERTLA, lids and conjunctivae normal ENMT: Mucous membranes are moist. Posterior pharynx clear of any exudate or lesions.   Neck: supple, no masses  Respiratory:  no wheezing, no crackles. No accessory muscle use.   Cardiovascular: Rate ~120 and regular.  No JVD. Abdomen: Soft, tender, purulent drainage from gastrostomy site. Bowel sounds active.  Musculoskeletal: no clubbing / cyanosis. No joint deformity upper and lower extremities.   Skin: no significant rashes, lesions, ulcers. Warm, dry, well-perfused. Neurologic: CN 2-12 grossly intact. Moving all extremities. Alert and oriented.  Psychiatric: Calm. Cooperative.    Labs and Imaging on Admission: I have personally reviewed following labs and imaging studies  CBC: Recent Labs  Lab 02/22/24 0834 02/27/24 1531 02/27/24 1845 02/27/24 2027  WBC 2.8* 2.7* 2.7*  --   NEUTROABS 1.6* 1.4* PENDING  --   HGB 8.0* 6.9* 6.2* 5.4*  HCT 23.4* 20.2* 19.9* 16.0*  MCV 75.0* 75.1* 79.6*  --   PLT 124* 108*  120*  --    Basic Metabolic Panel: Recent Labs  Lab 02/22/24 0834 02/27/24 1531 02/27/24 1845 02/27/24 2027  NA 135 136 136 136  K 3.2* 3.3* 3.4* 3.3*  CL 101 101 101 101  CO2 25 26 22   --   GLUCOSE 107* 112* 98 99  BUN 9 9 8 6   CREATININE 0.33* 0.51 0.56 0.70  CALCIUM  7.5* 8.2* 8.5*  --   MG 1.1* 1.4* 1.6*  --    GFR: Estimated Creatinine Clearance: 93 mL/min (by C-G formula based on SCr of 0.7 mg/dL). Liver Function Tests: Recent Labs  Lab 02/22/24 0834 02/27/24 1531 02/27/24 1845  AST 82* 14* 19  ALT 167* 35 40  ALKPHOS 1,109* 809* 811*  BILITOT 0.6 0.7 0.6  PROT 6.6 6.6 6.0*  ALBUMIN  3.2* 3.4* 3.1*   No results for input(s): LIPASE, AMYLASE in the last 168 hours. No results for input(s): AMMONIA in the last 168 hours. Coagulation Profile: Recent Labs  Lab 02/27/24 1845  INR 1.2   Cardiac Enzymes: No results for input(s): CKTOTAL, CKMB, CKMBINDEX, TROPONINI in the last 168 hours. BNP (last 3 results) No results for input(s): PROBNP in the last 8760 hours. HbA1C: No results for input(s): HGBA1C in the last 72 hours. CBG: No results for input(s): GLUCAP in the last 168 hours. Lipid Profile: No  results for input(s): CHOL, HDL, LDLCALC, TRIG, CHOLHDL, LDLDIRECT in the last 72 hours. Thyroid  Function Tests: No results for input(s): TSH, T4TOTAL, FREET4, T3FREE, THYROIDAB in the last 72 hours. Anemia Panel: No results for input(s): VITAMINB12, FOLATE, FERRITIN, TIBC, IRON, RETICCTPCT in the last 72 hours. Urine analysis:    Component Value Date/Time   COLORURINE YELLOW 02/22/2024 1223   APPEARANCEUR CLEAR 02/22/2024 1223   LABSPEC 1.015 02/22/2024 1223   PHURINE 6.0 02/22/2024 1223   GLUCOSEU NEGATIVE 02/22/2024 1223   HGBUR NEGATIVE 02/22/2024 1223   BILIRUBINUR NEGATIVE 02/22/2024 1223   KETONESUR 5 (A) 02/22/2024 1223   PROTEINUR NEGATIVE 02/22/2024 1223   UROBILINOGEN 0.2 11/01/2013 1246   NITRITE NEGATIVE 02/22/2024 1223   LEUKOCYTESUR NEGATIVE 02/22/2024 1223   Sepsis Labs: @LABRCNTIP (procalcitonin:4,lacticidven:4) ) Recent Results (from the past 240 hours)  Culture, blood (Routine X 2) w Reflex to ID Panel     Status: None   Collection Time: 02/22/24 12:03 PM   Specimen: BLOOD  Result Value Ref Range Status   Specimen Description BLOOD LEFT ANTECUBITAL  Final   Special Requests   Final    BOTTLES DRAWN AEROBIC AND ANAEROBIC Blood Culture adequate volume   Culture   Final    NO GROWTH 5 DAYS Performed at [facility], [address], [place], [place] [address]    Report Status 02/27/2024 FINAL  Final  Culture, blood (Routine X 2) w Reflex to ID Panel     Status: None   Collection Time: 02/22/24 12:23 PM   Specimen: BLOOD  Result Value Ref Range Status   Specimen Description BLOOD RIGHT ANTECUBITAL  Final   Special Requests   Final    BOTTLES DRAWN AEROBIC AND ANAEROBIC Blood Culture adequate volume   Culture   Final    NO GROWTH 5 DAYS Performed at [facility], [address] [place], [place] [address]    Report Status 02/27/2024 FINAL  Final  Urine Culture     Status: Abnormal   Collection Time: 02/22/24  12:24 PM   Specimen: Urine, Clean Catch  Result Value Ref Range Status   Specimen  Description   Final    URINE, CLEAN CATCH Performed at [facility], [address] [place], [place] [address]    Special Requests   Final    NONE Performed at [facility], [address] [place], [place] [address]    Culture (A)  Final    <10,000 COLONIES/mL INSIGNIFICANT GROWTH Performed at [facility], [address] [place], [place] [address]    Report Status 02/23/2024 FINAL  Final  Resp panel by RT-PCR (RSV, Flu A&B, Covid) Anterior Nasal Swab     Status: None   Collection Time: 02/27/24  7:20 PM   Specimen: Anterior Nasal Swab  Result Value Ref Range Status   SARS Coronavirus 2 by RT PCR NEGATIVE NEGATIVE Final    Comment: (NOTE) SARS-CoV-2 target nucleic acids are NOT DETECTED.  The SARS-CoV-2 RNA is generally detectable in upper respiratory specimens during the acute phase of infection. The lowest concentration of SARS-CoV-2 viral copies this assay can detect is 138 copies/mL. A negative result does not preclude SARS-Cov-2 infection and should not be used as the sole basis for treatment or other patient management decisions. A negative result may occur with  improper specimen collection/handling, submission of specimen other than nasopharyngeal swab, presence of viral mutation(s) within the areas targeted by this assay, and inadequate number of viral copies(<138 copies/mL). A negative result must be combined with clinical observations, patient history, and epidemiological information. The expected result is Negative.  Fact Sheet for Patients:  BloggerCourse.com  Fact Sheet for Healthcare Providers:  SeriousBroker.it  This test is no t yet approved or cleared by the [place]  FDA and  has been authorized for detection and/or diagnosis of SARS-CoV-2 by FDA under an  Emergency Use Authorization (EUA). This EUA will remain  in effect (meaning this test can be used) for the duration of the COVID-19 declaration under Section 564(b)(1) of the Act, 21 U.S.C.section 360bbb-3(b)(1), unless the authorization is terminated  or revoked sooner.       Influenza A by PCR NEGATIVE NEGATIVE Final   Influenza B by PCR NEGATIVE NEGATIVE Final    Comment: (NOTE) The Xpert Xpress SARS-CoV-2/FLU/RSV plus assay is intended as an aid in the diagnosis of influenza from Nasopharyngeal swab specimens and should not be used as a sole basis for treatment. Nasal washings and aspirates are unacceptable for Xpert Xpress SARS-CoV-2/FLU/RSV testing.  Fact Sheet for Patients: BloggerCourse.com  Fact Sheet for Healthcare Providers: SeriousBroker.it  This test is not yet approved or cleared by the [place]  FDA and has been authorized for detection and/or diagnosis of SARS-CoV-2 by FDA under an Emergency Use Authorization (EUA). This EUA will remain in effect (meaning this test can be used) for the duration of the COVID-19 declaration under Section 564(b)(1) of the Act, 21 U.S.C. section 360bbb-3(b)(1), unless the authorization is terminated or revoked.     Resp Syncytial Virus by PCR NEGATIVE NEGATIVE Final    Comment: (NOTE) Fact Sheet for Patients: BloggerCourse.com  Fact Sheet for Healthcare Providers: SeriousBroker.it  This test is not yet approved or cleared by the [place]  FDA and has been authorized for detection and/or diagnosis of SARS-CoV-2 by FDA under an Emergency Use Authorization (EUA). This EUA will remain in effect (meaning this test can be used) for the duration of the COVID-19 declaration under Section 564(b)(1) of the Act, 21 U.S.C. section 360bbb-3(b)(1), unless the authorization is terminated or revoked.  Performed at [facility], [address] [place], [place] [address]      Radiological Exams on Admission: CT Angio Chest PE W and/or Wo Contrast Result Date: 02/27/2024 EXAM: CTA CHEST PE WITHOUT AND WITH CONTRAST CT ABDOMEN AND PELVIS WITHOUT AND WITH CONTRAST 02/27/2024 09:35:50 PM TECHNIQUE: CTA of the chest was performed after the administration of intravenous contrast. Multiplanar reformatted images are provided for review. MIP images are provided for review. CT of the abdomen and pelvis was performed with the administration of intravenous contrast. Automated exposure control, iterative reconstruction, and/or weight based adjustment of the mA/kV was utilized to reduce the radiation dose to as low as reasonably achievable. COMPARISON: CT abdomen and pelvis 02/02/2024; x-ray 02/27/2024 and CT 12/22/2023. CLINICAL HISTORY: Pulmonary embolism (PE) suspected, high prob. Pt arrives via wheelchair from the cancer center. PT was being seen today for a scheduled visit, and was found to be TACHYcardic 130+, and a low hemoglobin. Pt refused admission, but agreed to come to the ED for a transfusion. Pt very adamant that she not be admitted to the hospital during triage. Port accessed prior to arrival. Sepsis. FINDINGS: CHEST: PULMONARY ARTERIES: Pulmonary arteries are adequately opacified for evaluation. No intraluminal filling defect to suggest pulmonary embolism. Main pulmonary artery is normal in caliber. MEDIASTINUM: No mediastinal lymphadenopathy. The heart and pericardium demonstrate no acute abnormality. There is no acute abnormality of the thoracic aorta. LUNGS AND PLEURA: Similar appearing bilateral upper lobe clustered centrilobular nodules. The largest nodule measures 6 mm in the right apex (series 11 image 25). This is unchanged from 12/22/2023. No new focal consolidation. No pleural effusion or pneumothorax. SOFT TISSUES AND BONES: No acute bone or soft tissue abnormality. Persistent axial and appendicular sclerotic  osseous metastases. ABDOMEN AND PELVIS: LIVER: The liver is unremarkable. GALLBLADDER AND BILE DUCTS: Gallbladder is unremarkable. Unchanged dilation of the common bile duct measuring 12 mm. SPLEEN: Spleen demonstrates no acute abnormality. PANCREAS: Pancreatic head mass seen on MRI is not well visualized via CT. No ductal dilation or peripancreatic inflammatory stranding. ADRENAL GLANDS: Adrenal glands demonstrate no acute abnormality. KIDNEYS, URETERS AND BLADDER: No stones in the kidneys or ureters. No hydronephrosis. No perinephric or periureteral stranding. Urinary bladder is unremarkable. GI AND BOWEL: Percutaneous gastrostomy tube in the stomach. The previous percutaneous jejunostomy catheter has been removed. There is a multiloculated peripherally enhancing fluid collection in the left peritoneum extending into the left abdominal oblique musculature at the site of the prior jejunostomy. The fluid collection measures 5.2 x 4.0 cm in the axial plane. There is a soft tissue tract containing gas and fluid from the loculated fluid collection extending to the skin surface (series 5 image [id number]). Subcutaneous fat stranding adjacent to the fluid collection. The fluid collection closely abuts the jejunum. If there is concern for contained perforation, CT with IV and oral contrast is recommended. Stomach and duodenal sweep demonstrate no acute abnormality. There is no bowel obstruction. REPRODUCTIVE: No acute abnormality. PERITONEUM AND RETROPERITONEUM: No free air. Similar infiltrative soft tissue in the omentum. Previously seen peritoneal nodule in the left lower quadrant is not well visualized and may be obscured by the calculated fluid collection. LYMPH NODES: No lymphadenopathy. BONES AND SOFT TISSUES: Unchanged chronic compression fracture of L3. Diffuse sclerotic metastases in the thoracolumbar spine and pelvis. IMPRESSION: 1. Multiloculated peripherally enhancing fluid collection in the left peritoneum  extending into the left abdominal oblique musculature at the site of the prior jejunostomy with a soft tissue tract extending to the skin surface. The fluid collection closely abuts  the jejunum. If there is concern for contained perforation, CT with IV and oral contrast is recommended. 2. 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 3. Known pancreatic head mass not well visualized. 4. Similar peritoneal carcinomatosis. 5. Similar indeterminate cluster nodularity in the bilateral upper lobes. This may be infectious/inflammatory or due to metastases. 6. No pulmonary embolism. Electronically signed by: Norman Gatlin MD 02/27/2024 10:00 PM EDT RP Workstation: [id number]   CT ABDOMEN PELVIS W CONTRAST Result Date: 02/27/2024 EXAM: CTA CHEST PE WITHOUT AND WITH CONTRAST CT ABDOMEN AND PELVIS WITHOUT AND WITH CONTRAST 02/27/2024 09:35:50 PM TECHNIQUE: CTA of the chest was performed after the administration of intravenous contrast. Multiplanar reformatted images are provided for review. MIP images are provided for review. CT of the abdomen and pelvis was performed with the administration of intravenous contrast. Automated exposure control, iterative reconstruction, and/or weight based adjustment of the mA/kV was utilized to reduce the radiation dose to as low as reasonably achievable. COMPARISON: CT abdomen and pelvis 02/02/2024; x-ray 02/27/2024 and CT 12/22/2023. CLINICAL HISTORY: Pulmonary embolism (PE) suspected, high prob. Pt arrives via wheelchair from the cancer center. PT was being seen today for a scheduled visit, and was found to be TACHYcardic 130+, and a low hemoglobin. Pt refused admission, but agreed to come to the ED for a transfusion. Pt very adamant that she not be admitted to the hospital during triage. Port accessed prior to arrival. Sepsis. FINDINGS: CHEST: PULMONARY ARTERIES: Pulmonary arteries are adequately opacified for evaluation. No intraluminal filling defect to suggest  pulmonary embolism. Main pulmonary artery is normal in caliber. MEDIASTINUM: No mediastinal lymphadenopathy. The heart and pericardium demonstrate no acute abnormality. There is no acute abnormality of the thoracic aorta. LUNGS AND PLEURA: Similar appearing bilateral upper lobe clustered centrilobular nodules. The largest nodule measures 6 mm in the right apex (series 11 image 25). This is unchanged from 12/22/2023. No new focal consolidation. No pleural effusion or pneumothorax. SOFT TISSUES AND BONES: No acute bone or soft tissue abnormality. 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 ABDOMEN AND PELVIS: LIVER: The liver is unremarkable. GALLBLADDER AND BILE DUCTS: Gallbladder is unremarkable. Unchanged dilation of the common bile duct measuring 12 mm. SPLEEN: Spleen demonstrates no acute abnormality. PANCREAS: Pancreatic head mass seen on MRI is not well visualized via CT. No ductal dilation or peripancreatic inflammatory stranding. ADRENAL GLANDS: Adrenal glands demonstrate no acute abnormality. KIDNEYS, URETERS AND BLADDER: No stones in the kidneys or ureters. No hydronephrosis. No perinephric or periureteral stranding. Urinary bladder is unremarkable. GI AND BOWEL: Percutaneous gastrostomy tube in the stomach. The previous percutaneous jejunostomy catheter has been removed. There is a multiloculated peripherally enhancing fluid collection in the left peritoneum extending into the left abdominal oblique musculature at the site of the prior jejunostomy. The fluid collection measures 5.2 x 4.0 cm in the axial plane. There is a soft tissue tract containing gas and fluid from the loculated fluid collection extending to the skin surface (series 5 image [id number]). Subcutaneous fat stranding adjacent to the fluid collection. The fluid collection closely abuts the jejunum. If there is concern for contained perforation, CT with IV and oral contrast is recommended. Stomach and duodenal sweep  demonstrate no acute abnormality. There is no bowel obstruction. REPRODUCTIVE: No acute abnormality. PERITONEUM AND RETROPERITONEUM: No free air. Similar infiltrative soft tissue in the omentum. Previously seen peritoneal nodule in the left lower quadrant is not well visualized and may be obscured by the calculated fluid collection. LYMPH NODES: No lymphadenopathy. BONES AND SOFT  TISSUES: Unchanged chronic compression fracture of L3. Diffuse sclerotic metastases in the thoracolumbar spine and pelvis. IMPRESSION: 1. Multiloculated peripherally enhancing fluid collection in the left peritoneum extending into the left abdominal oblique musculature at the site of the prior jejunostomy with a soft tissue tract extending to the skin surface. The fluid collection closely abuts the jejunum. If there is concern for contained perforation, CT with IV and oral contrast is recommended. 2. 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 3. Known pancreatic head mass not well visualized. 4. Similar peritoneal carcinomatosis. 5. Similar indeterminate cluster nodularity in the bilateral upper lobes. This may be infectious/inflammatory or due to metastases. 6. No pulmonary embolism. Electronically signed by: Norman Gatlin MD 02/27/2024 10:00 PM EDT RP Workstation: [id number]   DG Chest Port 1 View Result Date: 02/27/2024 CLINICAL DATA:  Questionable sepsis - evaluate for abnormality. Tachycardia. EXAM: PORTABLE CHEST 1 VIEW COMPARISON:  02/03/2024 FINDINGS: Right Port-A-Cath in place with the tip at the cavoatrial junction. Heart and mediastinal contours are within normal limits. No focal opacities or effusions. No acute bony abnormality. IMPRESSION: No active disease. Electronically Signed   By: Franky Crease M.D.   On: 02/27/2024 19:02    EKG: Independently reviewed. Sinus tachycardia, rate 107.   Assessment/Plan   1. Fluid collection involving left peritoneum and abdominal wall  - Surgery consulted by ED  physician  - Continue empiric antibiotics, follow cultures and clinical course   2. Acute on chronic anemia; pancytopenia  - Hgb down to 6.2; platelets and WBC appear stable  - No overt bleeding, likely related to cancer treatment  - She was given 1 unit RBCs in ED  - Repeat CBC and transfuse additional RBC as needed   3. Breast cancer with metastases  - Breast cancer with osseous metastases, pancreatic lesion, and peritoneal carcinomatosis undergoing treatment with Xgeva  under the care of Dr. Odean    4. Chronic pain  - Continue fentanyl  patch and oxycodone    5. Hypomagnesemia; hypokalemia  - Replacing   DVT prophylaxis: SCDs  Code Status: Full  Level of Care: Level of care: Progressive Family Communication: None present  Disposition Plan:  Patient is from: Home  Anticipated d/c is to: TBD Anticipated d/c date is: 03/01/24  Patient currently: Pending surgical consultation, stable H&H  Consults called: Surgery  Admission status: Inpatient     Evalene GORMAN Sprinkles, MD [facility]  02/28/2024, 12:47 AM

## 2024-02-28 NOTE — Discharge Summary (Signed)
  Physician Discharge Summary   Patient: Chelsea Hansen MRN: [id number] DOB: 1971-05-17  Admit date:     02/27/2024  Discharge date: 02/28/24  Discharge Physician: Evalene RAMAN Keshayla Schrum   PCP: [name], NP   Patient left AMA.   Patient has unfortunately elected to leave AMA. Dr. Melvenia and I both discussed our concerns with the patient, explaining that the critically low Hgb and intraabdominal/abdominal wall abscess with fever and tachycardia are both life-threatening and should be addressed in the hospital. She expresses understanding and dose not have any specific concern about staying that we can address but feels that she has just spent too much time in the hospital recently and wants to go home for now. She was encouraged to return for treatment as soon as possible.   Signed: Evalene RAMAN Sprinkles, MD [facility] 02/28/2024

## 2024-02-28 NOTE — ED Notes (Signed)
 Pt still adamant to leave after the potassium finished, provider aware.

## 2024-02-29 ENCOUNTER — Telehealth: Payer: MEDICAID | Admitting: *Deleted

## 2024-02-29 ENCOUNTER — Encounter: Payer: Self-pay | Admitting: *Deleted

## 2024-03-01 ENCOUNTER — Encounter: Payer: Self-pay | Admitting: *Deleted

## 2024-03-02 ENCOUNTER — Ambulatory Visit (HOSPITAL_COMMUNITY): Payer: MEDICAID

## 2024-03-02 ENCOUNTER — Telehealth: Payer: Self-pay | Admitting: *Deleted

## 2024-03-02 NOTE — Transitions of Care (Post Inpatient/ED Visit) (Signed)
 Transition of Care week 4  Visit Note  03/02/2024  Name: Chelsea Hansen MRN: [id number]          DOB: February 01, 1971  Situation: Patient enrolled in [facility] 30-day program. Visit completed with Ms. Motz by telephone.   Background:   Initial Transition Care Management Follow-up Telephone Call    Past Medical History:  Diagnosis Date   Allergy    seasonal allergies   Anxiety    on meds   Asthma    uses inhaler   Breast cancer (HCC) 2012   RIGHT lumpectomy   Cancer (HCC) 2022   RIGHT breast lump-dx 2022   Depression    on meds   DVT (deep venous thrombosis) (HCC) 2010   after hysterectomy   Family history of uterine cancer    GERD (gastroesophageal reflux disease)    with certain foods/OTC PRN meds   Headache(784.0)    History of radiation therapy    Bilateral breast- 09/03/21-11/02/21- Dr. Lynwood Nasuti   Hyperlipidemia    on meds   Hypertension    on meds   SVT (supraventricular tachycardia) (HCC)     Assessment: Patient Reported Symptoms: Cognitive Cognitive Status: Able to follow simple commands, Normal speech and language skills, Alert and oriented to person, place, and time      Neurological Neurological Review of Symptoms: Not assessed    HEENT HEENT Symptoms Reported: Not assessed      Cardiovascular Cardiovascular Symptoms Reported: Not assessed    Respiratory Respiratory Symptoms Reported: Not assesed    Endocrine Endocrine Symptoms Reported: Not assessed    Gastrointestinal Additional Gastrointestinal Details: G tube in place, drainage from ostomy/J tube site. Patient evaluated in the ED on 02/27/24, patient left prior to admission. Today she reports taking oral antibiotics and feeling a little better. Planning to return to the ED on 03/04/24 for treatment. Patient is needing ostomy supplies and has questions/concerns regarding ostomy. RNCM connected with provider requesting patient outreach. Gastrointestinal Management Strategies: Medication therapy, Adequate  rest Gastrointestinal Self-Management Outcome: 3 (uncertain)    Genitourinary Genitourinary Symptoms Reported: Not assessed    Integumentary Integumentary Symptoms Reported: Wound Skin Management Strategies: Medication therapy, Dressing changes, Routine screening Skin Self-Management Outcome: 2 (bad) Skin Comment: Patient has concerns regarding needing ostomy supplies and increased drainage.  Musculoskeletal Musculoskelatal Symptoms Reviewed: Not assessed        Psychosocial Psychosocial Symptoms Reported: Not assessed         There were no vitals filed for this visit.  Medications Reviewed Today     Reviewed by [name], RN (Registered Nurse) on 03/02/24 at 1234  Med List Status: <None>   Medication Order Taking? Sig Documenting Provider Last Dose Status Informant  ciprofloxacin  (CIPRO ) 500 MG tablet [id number] Yes Take 1 tablet (500 mg total) by mouth 2 (two) times daily. [name], NP  Active Self, Pharmacy Records  dexamethasone  (DECADRON ) 4 MG tablet [id number]  Take 2 tablets (8 mg) by mouth daily for 3 days starting the day after chemotherapy. Take with food.  Patient not taking: Reported on 03/02/2024   [name], [name], MD  Active Self, Pharmacy Records  fentaNYL  (DURAGESIC ) 12 MCG/HR [id number] Yes Place 1 patch onto the skin every 3 (three) days. Samtani, Jai-Gurmukh, MD  Active Self, Pharmacy Records  lidocaine -prilocaine  (EMLA ) cream [id number] Yes Apply to affected area once [name], [name], MD  Active Self, Pharmacy Records  Multiple Vitamin (MULTIVITAMIN WITH MINERALS) TABS tablet [id number]  Take 1 tablet by mouth daily.  Patient not taking: Reported on 03/02/2024   Samtani, Jai-Gurmukh, MD  Active Self, Pharmacy Records  ondansetron  (ZOFRAN ) 8 MG tablet [id number] Yes Take 1 tablet (8 mg total) by mouth every 8 (eight) hours as needed for nausea or vomiting. Start on the third day after chemotherapy. [name], [name], MD  Active Self, Pharmacy Records   oxyCODONE  (ROXICODONE ) 15 MG immediate release tablet [id number] Yes Take 1 tablet (15 mg total) by mouth every 4 (four) hours as needed for pain. [name], [name], NP  Active Self, Pharmacy Records  pantoprazole  (PROTONIX ) 40 MG tablet [id number] Yes Take 1 tablet (40 mg total) by mouth 2 (two) times daily. [name], NP  Active Self, Pharmacy Records  polyethylene glycol powder (GLYCOLAX /MIRALAX ) 17 GM/SCOOP powder [id number]  Mix 17 g in 4 oz of liquid and take by mouth 2 (two) times daily as needed.  Patient not taking: Reported on 03/02/2024   [name], NP  Active Self, Pharmacy Records           Med Note (CRUTHIS, CHLOE C   Tue Dec 13, 2023  8:00 AM)    prochlorperazine  (COMPAZINE ) 10 MG tablet [id number] Yes Take 1 tablet (10 mg total) by mouth every 6 (six) hours as needed for nausea or vomiting. [name], [name], MD  Active Self, Pharmacy Records  senna (SENOKOT) 8.6 MG TABS tablet [id number]  Take 2 tablets (17.2 mg total) by mouth 2 (two) times daily.  Patient not taking: Reported on 03/02/2024   [name], Athena N, NP  Active Self, Pharmacy Records  sulfamethoxazole -trimethoprim  (BACTRIM  DS) 800-160 MG tablet [id number] Yes Take 1 tablet by mouth 2 (two) times daily. [name], NP  Active Self, Pharmacy Records  thiamine  (VITAMIN B1) 100 MG tablet [id number]  Take 1 tablet (100 mg total) by mouth daily.  Patient not taking: Reported on 03/02/2024   Samtani, Jai-Gurmukh, MD  Active Self, Pharmacy Records  Med List Note Rockney Norleen LABOR, RPH-CPP 07/07/22 9196): Abemaciclib  through [facility];   UDS 02/11/22  MR 05/22/22 Medication Agreement signed. LP  Hydrocodone  approved through 5 13 23             Recommendation:   Continue Current Plan of Care  Follow Up Plan:   Telephone follow-up in 1 week  [name] RN, BSN [place]  [facility] RN Care Manager [phone]

## 2024-03-02 NOTE — Patient Instructions (Signed)
 Visit Information  Thank you for taking time to visit with me today. Please don't hesitate to contact me if I can be of assistance to you before our next scheduled telephone appointment.   Following is a copy of your care plan:   Goals Addressed             This Visit's Progress    VBCI Transitions of Care (TOC) Care Plan       Problems:  Recent Hospitalization for treatment of SBO Knowledge Deficit Related to ostomy care  Goal:  Over the next 30 days, the patient will not experience hospital readmission  Interventions:  Transitions of Care: Doctor Visits  - discussed the importance of doctor visits Post discharge activity limitations prescribed by provider reviewed Post-op wound/incision care reviewed with patient/caregiver Reviewed Signs and symptoms of infection Reviewed upcoming appointments including: 03/07/24 with [facility]  Reviewed provider notes from recent ED visit Provided patient information on [employer] to inquire about free ostomy supplies-revisited Medications reviewed, discussed taking oral antibiotics Cipro  and Bactrim   Secure communication sent to [facility] requesting she contact patient regarding questions about Ostomy supplies and concerns Advised patient to return to the hospital for recommended antibiotic treatment  Patient Self Care Activities:  Attend all scheduled provider appointments Call provider office for new concerns or questions  Notify RN Care Manager of [facility] call rescheduling needs Participate in Transition of Care Program/Attend [facility] scheduled calls  Plan:  Telephone follow up appointment with care management team member scheduled for:  03/09/24 at 2pm        Patient verbalizes understanding of instructions and care plan provided today and agrees to view in MyChart. Active MyChart status and patient understanding of how to access instructions and care plan via MyChart confirmed with patient.     Telephone  follow up appointment with care management team member scheduled for:03/09/24 at 2pm  Please call the care guide team at [phone] if you need to cancel or reschedule your appointment.   Please call [phone]-TALK (toll free, 24 hour hotline) go to [facility] [address], [place] [phone]) call 911 if you are experiencing a Mental Health or Behavioral Health Crisis or need someone to talk to.  [name] RN, BSN [place]  [facility] RN Care Manager [phone]

## 2024-03-03 LAB — CULTURE, BLOOD (ROUTINE X 2)
Culture: NO GROWTH
Culture: NO GROWTH
Special Requests: ADEQUATE

## 2024-03-04 ENCOUNTER — Encounter (HOSPITAL_COMMUNITY): Payer: Self-pay

## 2024-03-04 ENCOUNTER — Inpatient Hospital Stay (HOSPITAL_COMMUNITY)
Admission: EM | Admit: 2024-03-04 | Discharge: 2024-03-09 | DRG: 380 | Disposition: A | Discharge status: Home / Self Care | Payer: MEDICAID | Attending: Internal Medicine | Admitting: Internal Medicine

## 2024-03-04 ENCOUNTER — Other Ambulatory Visit: Payer: Self-pay

## 2024-03-04 DIAGNOSIS — R627 Adult failure to thrive: Secondary | ICD-10-CM | POA: Diagnosis present

## 2024-03-04 DIAGNOSIS — Z170 Estrogen receptor positive status [ER+]: Secondary | ICD-10-CM

## 2024-03-04 DIAGNOSIS — Z79899 Other long term (current) drug therapy: Secondary | ICD-10-CM

## 2024-03-04 DIAGNOSIS — Z934 Other artificial openings of gastrointestinal tract status: Secondary | ICD-10-CM

## 2024-03-04 DIAGNOSIS — C50919 Malignant neoplasm of unspecified site of unspecified female breast: Secondary | ICD-10-CM | POA: Diagnosis present

## 2024-03-04 DIAGNOSIS — Z9884 Bariatric surgery status: Secondary | ICD-10-CM

## 2024-03-04 DIAGNOSIS — Z6822 Body mass index (BMI) 22.0-22.9, adult: Secondary | ICD-10-CM

## 2024-03-04 DIAGNOSIS — E785 Hyperlipidemia, unspecified: Secondary | ICD-10-CM | POA: Diagnosis present

## 2024-03-04 DIAGNOSIS — C786 Secondary malignant neoplasm of retroperitoneum and peritoneum: Secondary | ICD-10-CM | POA: Diagnosis present

## 2024-03-04 DIAGNOSIS — K5903 Drug induced constipation: Secondary | ICD-10-CM

## 2024-03-04 DIAGNOSIS — Z8249 Family history of ischemic heart disease and other diseases of the circulatory system: Secondary | ICD-10-CM

## 2024-03-04 DIAGNOSIS — Z86718 Personal history of other venous thrombosis and embolism: Secondary | ICD-10-CM

## 2024-03-04 DIAGNOSIS — R634 Abnormal weight loss: Secondary | ICD-10-CM | POA: Diagnosis present

## 2024-03-04 DIAGNOSIS — Z923 Personal history of irradiation: Secondary | ICD-10-CM

## 2024-03-04 DIAGNOSIS — Z79891 Long term (current) use of opiate analgesic: Secondary | ICD-10-CM

## 2024-03-04 DIAGNOSIS — D630 Anemia in neoplastic disease: Secondary | ICD-10-CM | POA: Diagnosis present

## 2024-03-04 DIAGNOSIS — I10 Essential (primary) hypertension: Secondary | ICD-10-CM | POA: Diagnosis present

## 2024-03-04 DIAGNOSIS — L02211 Cutaneous abscess of abdominal wall: Principal | ICD-10-CM | POA: Diagnosis present

## 2024-03-04 DIAGNOSIS — Z931 Gastrostomy status: Secondary | ICD-10-CM

## 2024-03-04 DIAGNOSIS — R112 Nausea with vomiting, unspecified: Secondary | ICD-10-CM | POA: Diagnosis present

## 2024-03-04 DIAGNOSIS — E876 Hypokalemia: Secondary | ICD-10-CM | POA: Diagnosis present

## 2024-03-04 DIAGNOSIS — Z8041 Family history of malignant neoplasm of ovary: Secondary | ICD-10-CM

## 2024-03-04 DIAGNOSIS — L03311 Cellulitis of abdominal wall: Secondary | ICD-10-CM | POA: Diagnosis present

## 2024-03-04 DIAGNOSIS — D649 Anemia, unspecified: Secondary | ICD-10-CM | POA: Diagnosis present

## 2024-03-04 DIAGNOSIS — Z803 Family history of malignant neoplasm of breast: Secondary | ICD-10-CM

## 2024-03-04 DIAGNOSIS — Z888 Allergy status to other drugs, medicaments and biological substances status: Secondary | ICD-10-CM

## 2024-03-04 DIAGNOSIS — C7951 Secondary malignant neoplasm of bone: Secondary | ICD-10-CM | POA: Diagnosis present

## 2024-03-04 DIAGNOSIS — K869 Disease of pancreas, unspecified: Secondary | ICD-10-CM | POA: Diagnosis present

## 2024-03-04 DIAGNOSIS — E860 Dehydration: Secondary | ICD-10-CM | POA: Diagnosis present

## 2024-03-04 DIAGNOSIS — C50411 Malignant neoplasm of upper-outer quadrant of right female breast: Secondary | ICD-10-CM | POA: Diagnosis present

## 2024-03-04 DIAGNOSIS — Z980 Intestinal bypass and anastomosis status: Secondary | ICD-10-CM

## 2024-03-04 DIAGNOSIS — K315 Obstruction of duodenum: Principal | ICD-10-CM | POA: Diagnosis present

## 2024-03-04 DIAGNOSIS — K632 Fistula of intestine: Secondary | ICD-10-CM | POA: Diagnosis present

## 2024-03-04 DIAGNOSIS — J45909 Unspecified asthma, uncomplicated: Secondary | ICD-10-CM | POA: Diagnosis present

## 2024-03-04 DIAGNOSIS — K8689 Other specified diseases of pancreas: Secondary | ICD-10-CM | POA: Diagnosis present

## 2024-03-04 DIAGNOSIS — R188 Other ascites: Secondary | ICD-10-CM | POA: Diagnosis present

## 2024-03-04 DIAGNOSIS — D61818 Other pancytopenia: Secondary | ICD-10-CM | POA: Diagnosis present

## 2024-03-04 DIAGNOSIS — R7989 Other specified abnormal findings of blood chemistry: Secondary | ICD-10-CM | POA: Diagnosis present

## 2024-03-04 DIAGNOSIS — K56609 Unspecified intestinal obstruction, unspecified as to partial versus complete obstruction: Secondary | ICD-10-CM | POA: Diagnosis present

## 2024-03-04 DIAGNOSIS — Z8049 Family history of malignant neoplasm of other genital organs: Secondary | ICD-10-CM

## 2024-03-04 DIAGNOSIS — K831 Obstruction of bile duct: Secondary | ICD-10-CM | POA: Diagnosis present

## 2024-03-04 DIAGNOSIS — K838 Other specified diseases of biliary tract: Secondary | ICD-10-CM

## 2024-03-04 DIAGNOSIS — K2100 Gastro-esophageal reflux disease with esophagitis, without bleeding: Secondary | ICD-10-CM | POA: Diagnosis present

## 2024-03-04 DIAGNOSIS — Z1722 Progesterone receptor negative status: Secondary | ICD-10-CM

## 2024-03-04 DIAGNOSIS — Z79811 Long term (current) use of aromatase inhibitors: Secondary | ICD-10-CM

## 2024-03-04 DIAGNOSIS — G8929 Other chronic pain: Secondary | ICD-10-CM | POA: Diagnosis present

## 2024-03-04 LAB — I-STAT CHEM 8, ED
BUN: 18 mg/dL (ref 6–20)
Calcium, Ion: 1.11 mmol/L — ABNORMAL LOW (ref 1.15–1.40)
Chloride: 93 mmol/L — ABNORMAL LOW (ref 98–111)
Creatinine, Ser: 1.1 mg/dL — ABNORMAL HIGH (ref 0.44–1.00)
Glucose, Bld: 110 mg/dL — ABNORMAL HIGH (ref 70–99)
HCT: 22 % — ABNORMAL LOW (ref 36.0–46.0)
Hemoglobin: 7.5 g/dL — ABNORMAL LOW (ref 12.0–15.0)
Potassium: 2.7 mmol/L — CL (ref 3.5–5.1)
Sodium: 136 mmol/L (ref 135–145)
TCO2: 28 mmol/L (ref 22–32)

## 2024-03-04 LAB — I-STAT CG4 LACTIC ACID, ED: Lactic Acid, Venous: 1.1 mmol/L (ref 0.5–1.9)

## 2024-03-04 MED ORDER — HYDROMORPHONE HCL 1 MG/ML IJ SOLN
1.0000 mg | Freq: Once | INTRAMUSCULAR | Status: AC
  Administered 2024-03-04: 1 mg via INTRAVENOUS
  Filled 2024-03-04: qty 1

## 2024-03-04 MED ORDER — POTASSIUM CHLORIDE 10 MEQ/100ML IV SOLN: 10.0000 meq | Freq: Once | INTRAVENOUS | Status: DC

## 2024-03-04 MED ORDER — ONDANSETRON HCL 4 MG/2ML IJ SOLN
4.0000 mg | Freq: Once | INTRAMUSCULAR | Status: AC
  Administered 2024-03-04: 4 mg via INTRAVENOUS
  Filled 2024-03-04: qty 2

## 2024-03-04 MED ORDER — SODIUM CHLORIDE 0.9 % IV BOLUS
500.0000 mL | Freq: Once | INTRAVENOUS | Status: AC
  Administered 2024-03-04: 500 mL via INTRAVENOUS

## 2024-03-04 NOTE — ED Provider Notes (Signed)
 [place] EMERGENCY DEPARTMENT AT [facility] Provider Note   CSN: [id number] Arrival date & time: 03/04/24  2043     Patient presents with: Abdominal Pain   Chelsea Hansen is a 53 y.o. female.   Patient to ED as encouraged by her doctors to continue treatment for a recently diagnosed abdominal abscess. History of breast CA, mets to bone, currently on chemotherapy. Seen by oncology 9/8 and found to be febrile, tachycardic, having nausea/vomiting and abdominal pain. Sent to the ED and ultimately received 1 unit PRBC's and underwent CT showing:   IMPRESSION: 1. Multiloculated peripherally enhancing fluid collection in the left peritoneum extending into the left abdominal oblique musculature at the site of the prior jejunostomy with a soft tissue tract extending to the skin surface. The fluid collection closely abuts the jejunum. If there is concern for contained perforation, CT with IV and oral contrast is recommended. 2. 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 3. Known pancreatic head mass not well visualized. 4. Similar peritoneal carcinomatosis. 5. Similar indeterminate cluster nodularity in the bilateral upper lobes. This may be infectious/inflammatory or due to metastases. 6. No pulmonary embolism.  She was admitted to hospitalist with pending surgical consult but signed out AMA on 9/9.   Since that time she continues to have nausea and abdominal pain. She had persistent fever until 2 days ago when it resolved.   The history is provided by the patient. No language interpreter was used.  Abdominal Pain      Prior to Admission medications   Medication Sig Start Date End Date Taking? Authorizing Provider  ciprofloxacin  (CIPRO ) 500 MG tablet Take 1 tablet (500 mg total) by mouth 2 (two) times daily. 02/22/24   [name], NP  dexamethasone  (DECADRON ) 4 MG tablet Take 2 tablets (8 mg) by mouth daily for 3 days starting the day after  chemotherapy. Take with food. Patient not taking: Reported on 03/02/2024 02/08/24   [name], MD  fentaNYL  (DURAGESIC ) 12 MCG/HR Place 1 patch onto the skin every 3 (three) days. 02/08/24   Samtani, Jai-Gurmukh, MD  lidocaine -prilocaine  (EMLA ) cream Apply to affected area once 02/08/24   [name], [name], MD  Multiple Vitamin (MULTIVITAMIN WITH MINERALS) TABS tablet Take 1 tablet by mouth daily. Patient not taking: Reported on 03/02/2024 02/07/24   Samtani, Jai-Gurmukh, MD  ondansetron  (ZOFRAN ) 8 MG tablet Take 1 tablet (8 mg total) by mouth every 8 (eight) hours as needed for nausea or vomiting. Start on the third day after chemotherapy. 02/08/24   [name], MD  oxyCODONE  (ROXICODONE ) 15 MG immediate release tablet Take 1 tablet (15 mg total) by mouth every 4 (four) hours as needed for pain. 02/17/24   [name], Athena N, NP  pantoprazole  (PROTONIX ) 40 MG tablet Take 1 tablet (40 mg total) by mouth 2 (two) times daily. 01/25/24   [name], NP  polyethylene glycol powder (GLYCOLAX /MIRALAX ) 17 GM/SCOOP powder Mix 17 g in 4 oz of liquid and take by mouth 2 (two) times daily as needed. Patient not taking: Reported on 03/02/2024 11/30/23   [name], NP  prochlorperazine  (COMPAZINE ) 10 MG tablet Take 1 tablet (10 mg total) by mouth every 6 (six) hours as needed for nausea or vomiting. 02/08/24   [name], [name], MD  senna (SENOKOT) 8.6 MG TABS tablet Take 2 tablets (17.2 mg total) by mouth 2 (two) times daily. Patient not taking: Reported on 03/02/2024 01/31/24   [name], Athena N, NP  sulfamethoxazole -trimethoprim  (BACTRIM  DS) 800-160  MG tablet Take 1 tablet by mouth 2 (two) times daily. 02/22/24   [name], NP  thiamine  (VITAMIN B1) 100 MG tablet Take 1 tablet (100 mg total) by mouth daily. Patient not taking: Reported on 03/02/2024 02/07/24   Samtani, Jai-Gurmukh, MD    Allergies: Enhertu  [fam-trastuzumab  deruxtec-nxki]    Review of Systems   Gastrointestinal:  Positive for abdominal pain.    Updated Vital Signs BP 104/67 (BP Location: Left Arm)   Pulse (!) 106   Temp 98.7 F (37.1 C) (Oral)   Resp 18   Ht 5' 11 (1.803 m)   Wt 72.6 kg   SpO2 100%   BMI 22.32 kg/m   Physical Exam Vitals and nursing note reviewed.  Constitutional:      General: She is not in acute distress.    Appearance: She is well-developed. She is not ill-appearing.  HENT:     Head: Normocephalic.  Cardiovascular:     Rate and Rhythm: Tachycardia present.  Pulmonary:     Effort: Pulmonary effort is normal.     Breath sounds: No wheezing or rales.  Abdominal:     Tenderness: There is abdominal tenderness.     Comments: G-tube in place. There is a collection pouch in the LUQ, presumed Eakin's pouch, with small volume thin yellow drainage.  Abdomen is soft without significant distention. Tender most across LUQ to left axillary abdomen.  Neurological:     Mental Status: She is alert.     (all labs ordered are listed, but only abnormal results are displayed) Labs Reviewed  CBC WITH DIFFERENTIAL/PLATELET - Abnormal; Notable for the following components:      Result Value   WBC 3.2 (*)    RBC 2.94 (*)    Hemoglobin 7.6 (*)    HCT 23.7 (*)    MCH 25.9 (*)    RDW 18.7 (*)    Platelets 138 (*)    Neutro Abs 1.5 (*)    Abs Immature Granulocytes 0.25 (*)    All other components within normal limits  COMPREHENSIVE METABOLIC PANEL WITH GFR - Abnormal; Notable for the following components:   Potassium 2.7 (*)    Chloride 96 (*)    Glucose, Bld 108 (*)    Total Protein 6.1 (*)    Albumin  3.3 (*)    AST 47 (*)    ALT 45 (*)    Alkaline Phosphatase 1,109 (*)    All other components within normal limits  I-STAT CHEM 8, ED - Abnormal; Notable for the following components:   Potassium 2.7 (*)    Chloride 93 (*)    Creatinine, Ser 1.10 (*)    Glucose, Bld 110 (*)    Calcium , Ion 1.11 (*)    Hemoglobin 7.5 (*)    HCT 22.0 (*)    All other  components within normal limits  I-STAT CG4 LACTIC ACID, ED  I-STAT CG4 LACTIC ACID, ED   Results for orders placed or performed during the hospital encounter of 03/04/24  CBC with Differential   Collection Time: 03/04/24 11:05 PM  Result Value Ref Range   WBC 3.2 (L) 4.0 - 10.5 K/uL   RBC 2.94 (L) 3.87 - 5.11 MIL/uL   Hemoglobin 7.6 (L) 12.0 - 15.0 g/dL   HCT 76.2 (L) 63.9 - 53.9 %   MCV 80.6 80.0 - 100.0 fL   MCH 25.9 (L) 26.0 - 34.0 pg   MCHC 32.1 30.0 - 36.0 g/dL   RDW 81.2 (H) 88.4 -  15.5 %   Platelets 138 (L) 150 - 400 K/uL   nRBC 0.0 0.0 - 0.2 %   Neutrophils Relative % 46 %   Neutro Abs 1.5 (L) 1.7 - 7.7 K/uL   Lymphocytes Relative 31 %   Lymphs Abs 1.0 0.7 - 4.0 K/uL   Monocytes Relative 13 %   Monocytes Absolute 0.4 0.1 - 1.0 K/uL   Eosinophils Relative 1 %   Eosinophils Absolute 0.0 0.0 - 0.5 K/uL   Basophils Relative 1 %   Basophils Absolute 0.0 0.0 - 0.1 K/uL   Other OVALOCYTES %   Immature Granulocytes 8 %   Abs Immature Granulocytes 0.25 (H) 0.00 - 0.07 K/uL  Comprehensive metabolic panel   Collection Time: 03/04/24 11:05 PM  Result Value Ref Range   Sodium 137 135 - 145 mmol/L   Potassium 2.7 (LL) 3.5 - 5.1 mmol/L   Chloride 96 (L) 98 - 111 mmol/L   CO2 28 22 - 32 mmol/L   Glucose, Bld 108 (H) 70 - 99 mg/dL   BUN 18 6 - 20 mg/dL   Creatinine, Ser 9.07 0.44 - 1.00 mg/dL   Calcium  8.9 8.9 - 10.3 mg/dL   Total Protein 6.1 (L) 6.5 - 8.1 g/dL   Albumin  3.3 (L) 3.5 - 5.0 g/dL   AST 47 (H) 15 - 41 U/L   ALT 45 (H) 0 - 44 U/L   Alkaline Phosphatase 1,109 (H) 38 - 126 U/L   Total Bilirubin 0.4 0.0 - 1.2 mg/dL   GFR, Estimated >39 >39 mL/min   Anion gap 14 5 - 15  I-stat chem 8, ED   Collection Time: 03/04/24 11:16 PM  Result Value Ref Range   Sodium 136 135 - 145 mmol/L   Potassium 2.7 (LL) 3.5 - 5.1 mmol/L   Chloride 93 (L) 98 - 111 mmol/L   BUN 18 6 - 20 mg/dL   Creatinine, Ser 8.89 (H) 0.44 - 1.00 mg/dL   Glucose, Bld 889 (H) 70 - 99 mg/dL    Calcium , Ion 1.11 (L) 1.15 - 1.40 mmol/L   TCO2 28 22 - 32 mmol/L   Hemoglobin 7.5 (L) 12.0 - 15.0 g/dL   HCT 77.9 (L) 63.9 - 53.9 %   Comment NOTIFIED PHYSICIAN   I-Stat Lactic Acid   Collection Time: 03/04/24 11:17 PM  Result Value Ref Range   Lactic Acid, Venous 1.1 0.5 - 1.9 mmol/L   *Note: Due to a large number of results and/or encounters for the requested time period, some results have not been displayed. A complete set of results can be found in Results Review.    EKG: None  Radiology: No results found.   Procedures   Medications Ordered in the ED  iohexol  (OMNIPAQUE ) 300 MG/ML solution 100 mL (has no administration in time range)  potassium chloride  10 mEq in 100 mL IVPB (has no administration in time range)  sodium chloride  0.9 % bolus 500 mL (0 mLs Intravenous Stopped 03/05/24 0005)  HYDROmorphone  (DILAUDID ) injection 1 mg (1 mg Intravenous Given 03/04/24 2305)  ondansetron  (ZOFRAN ) injection 4 mg (4 mg Intravenous Given 03/04/24 2304)    Clinical Course as of 03/05/24 0019  Mon Mar 05, 2024  0011 Patient returns after [facility] 9/9 from admission for anemia, fever and intra-abdominal abscess. CA patient on chemo. No fever x 2 days.   Pain addressed on arrival to ED. On review of the CT 9/8, it was felt study with addition of PO contrast may be helpful. Obtained  repeat CT tonight and PO CM attempted but patient could not tolerate.   Pain improved with Dilaudid . Potassium found to be low at 2.7. IV potassium ordered.   Patient discussed with surgery who will see in the morning in consultation. Dr. Vanderbilt is aware, secure chat sent to surgical team per his request.  [SU]    Clinical Course User Index [SU] Odell Balls, PA-C                                 Medical Decision Making Amount and/or Complexity of Data Reviewed Labs: ordered.  Risk Prescription drug management.        Final diagnoses:  None    ED Discharge Orders     None           Odell Balls, PA-C 03/05/24 0019    Freddi Hamilton, MD 03/06/24 2130

## 2024-03-04 NOTE — ED Triage Notes (Signed)
 Pt states her hemoglobin is 5.4, this was drawn Monday, pt then had blood transfusion. Pt has not had labs rechecked since transfusion. Pt doctor told pt to come in on Wednesday to be evaluated. C/o abdominal pain, has stomach cancer, receiving chemo.

## 2024-03-04 NOTE — ED Provider Notes (Incomplete)
 [place] EMERGENCY DEPARTMENT AT [facility] Provider Note   CSN: [id number] Arrival date & time: 03/04/24  2043     Patient presents with: Abdominal Pain   Chelsea Hansen is a 53 y.o. female.  {Add pertinent medical, surgical, social history, OB history to YEP:67052} Patient to ED as encouraged by her doctors to continue treatment for a recently diagnosed abdominal abscess. History of breast CA, mets to bone, currently on chemotherapy. Seen by oncology 9/8 and found to be febrile, tachycardic, having nausea/vomiting and abdominal pain. Sent to the ED and ultimately received 1 unit PRBC's and underwent CT showing:   IMPRESSION: 1. Multiloculated peripherally enhancing fluid collection in the left peritoneum extending into the left abdominal oblique musculature at the site of the prior jejunostomy with a soft tissue tract extending to the skin surface. The fluid collection closely abuts the jejunum. If there is concern for contained perforation, CT with IV and oral contrast is recommended. 2. 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 3. Known pancreatic head mass not well visualized. 4. Similar peritoneal carcinomatosis. 5. Similar indeterminate cluster nodularity in the bilateral upper lobes. This may be infectious/inflammatory or due to metastases. 6. No pulmonary embolism.  She was admitted to hospitalist with pending surgical consult but signed out AMA on 9/9.   Since that time she continues to have nausea and abdominal pain. She had persistent fever until 2 days ago when it resolved.   The history is provided by the patient. No language interpreter was used.  Abdominal Pain      Prior to Admission medications   Medication Sig Start Date End Date Taking? Authorizing Provider  ciprofloxacin  (CIPRO ) 500 MG tablet Take 1 tablet (500 mg total) by mouth 2 (two) times daily. 02/22/24   [name], NP  dexamethasone  (DECADRON ) 4 MG  tablet Take 2 tablets (8 mg) by mouth daily for 3 days starting the day after chemotherapy. Take with food. Patient not taking: Reported on 03/02/2024 02/08/24   [name], MD  fentaNYL  (DURAGESIC ) 12 MCG/HR Place 1 patch onto the skin every 3 (three) days. 02/08/24   Samtani, Jai-Gurmukh, MD  lidocaine -prilocaine  (EMLA ) cream Apply to affected area once 02/08/24   [name], [name], MD  Multiple Vitamin (MULTIVITAMIN WITH MINERALS) TABS tablet Take 1 tablet by mouth daily. Patient not taking: Reported on 03/02/2024 02/07/24   Samtani, Jai-Gurmukh, MD  ondansetron  (ZOFRAN ) 8 MG tablet Take 1 tablet (8 mg total) by mouth every 8 (eight) hours as needed for nausea or vomiting. Start on the third day after chemotherapy. 02/08/24   [name], MD  oxyCODONE  (ROXICODONE ) 15 MG immediate release tablet Take 1 tablet (15 mg total) by mouth every 4 (four) hours as needed for pain. 02/17/24   [name], Athena N, NP  pantoprazole  (PROTONIX ) 40 MG tablet Take 1 tablet (40 mg total) by mouth 2 (two) times daily. 01/25/24   [name], NP  polyethylene glycol powder (GLYCOLAX /MIRALAX ) 17 GM/SCOOP powder Mix 17 g in 4 oz of liquid and take by mouth 2 (two) times daily as needed. Patient not taking: Reported on 03/02/2024 11/30/23   [name], NP  prochlorperazine  (COMPAZINE ) 10 MG tablet Take 1 tablet (10 mg total) by mouth every 6 (six) hours as needed for nausea or vomiting. 02/08/24   [name], [name], MD  senna (SENOKOT) 8.6 MG TABS tablet Take 2 tablets (17.2 mg total) by mouth 2 (two) times daily. Patient not taking: Reported on 03/02/2024 01/31/24  [name], [name], NP  sulfamethoxazole -trimethoprim  (BACTRIM  DS) 800-160 MG tablet Take 1 tablet by mouth 2 (two) times daily. 02/22/24   [name], NP  thiamine  (VITAMIN B1) 100 MG tablet Take 1 tablet (100 mg total) by mouth daily. Patient not taking: Reported on 03/02/2024 02/07/24   Samtani, Jai-Gurmukh, MD     Allergies: Enhertu  [fam-trastuzumab  deruxtec-nxki]    Review of Systems  Gastrointestinal:  Positive for abdominal pain.    Updated Vital Signs BP 104/67 (BP Location: Left Arm)   Pulse (!) 106   Temp 98.7 F (37.1 C) (Oral)   Resp 18   Ht 5' 11 (1.803 m)   Wt 72.6 kg   SpO2 100%   BMI 22.32 kg/m   Physical Exam Vitals and nursing note reviewed.  Constitutional:      General: She is not in acute distress.    Appearance: She is well-developed. She is not ill-appearing.  HENT:     Head: Normocephalic.  Cardiovascular:     Rate and Rhythm: Tachycardia present.  Pulmonary:     Effort: Pulmonary effort is normal.     Breath sounds: No wheezing or rales.  Abdominal:     Comments: G-tube in place. There is a collection pouch in the LUQ, presumed Eakin's pouch, with small volume thin yellow drainage.    Neurological:     Mental Status: She is alert.     (all labs ordered are listed, but only abnormal results are displayed) Labs Reviewed  CBC WITH DIFFERENTIAL/PLATELET  COMPREHENSIVE METABOLIC PANEL WITH GFR  I-STAT CHEM 8, ED    EKG: None  Radiology: No results found.  {Document cardiac monitor, telemetry assessment procedure when appropriate:32947} Procedures   Medications Ordered in the ED  sodium chloride  0.9 % bolus 500 mL (has no administration in time range)  HYDROmorphone  (DILAUDID ) injection 1 mg (has no administration in time range)  ondansetron  (ZOFRAN ) injection 4 mg (has no administration in time range)      {Click here for ABCD2, HEART and other calculators REFRESH Note before signing:1}                              Medical Decision Making Amount and/or Complexity of Data Reviewed Labs: ordered.  Risk Prescription drug management.   ***  {Document critical care time when appropriate  Document review of labs and clinical decision tools ie CHADS2VASC2, etc  Document your independent review of radiology images and any outside  records  Document your discussion with family members, caretakers and with consultants  Document social determinants of health affecting pt's care  Document your decision making why or why not admission, treatments were needed:32947:::1}   Final diagnoses:  None    ED Discharge Orders     None

## 2024-03-05 ENCOUNTER — Encounter (HOSPITAL_COMMUNITY): Payer: Self-pay | Admitting: Internal Medicine

## 2024-03-05 ENCOUNTER — Other Ambulatory Visit: Payer: Self-pay

## 2024-03-05 ENCOUNTER — Emergency Department (HOSPITAL_COMMUNITY): Payer: MEDICAID

## 2024-03-05 DIAGNOSIS — L02211 Cutaneous abscess of abdominal wall: Principal | ICD-10-CM | POA: Diagnosis present

## 2024-03-05 DIAGNOSIS — K869 Disease of pancreas, unspecified: Secondary | ICD-10-CM | POA: Diagnosis present

## 2024-03-05 DIAGNOSIS — K831 Obstruction of bile duct: Secondary | ICD-10-CM | POA: Diagnosis present

## 2024-03-05 DIAGNOSIS — D61818 Other pancytopenia: Secondary | ICD-10-CM | POA: Diagnosis present

## 2024-03-05 DIAGNOSIS — Z515 Encounter for palliative care: Secondary | ICD-10-CM | POA: Diagnosis not present

## 2024-03-05 DIAGNOSIS — Z934 Other artificial openings of gastrointestinal tract status: Secondary | ICD-10-CM | POA: Diagnosis not present

## 2024-03-05 DIAGNOSIS — I10 Essential (primary) hypertension: Secondary | ICD-10-CM | POA: Diagnosis present

## 2024-03-05 DIAGNOSIS — C50411 Malignant neoplasm of upper-outer quadrant of right female breast: Secondary | ICD-10-CM | POA: Diagnosis present

## 2024-03-05 DIAGNOSIS — R110 Nausea: Secondary | ICD-10-CM | POA: Diagnosis not present

## 2024-03-05 DIAGNOSIS — E876 Hypokalemia: Secondary | ICD-10-CM

## 2024-03-05 DIAGNOSIS — K2100 Gastro-esophageal reflux disease with esophagitis, without bleeding: Secondary | ICD-10-CM | POA: Diagnosis present

## 2024-03-05 DIAGNOSIS — R748 Abnormal levels of other serum enzymes: Secondary | ICD-10-CM | POA: Diagnosis not present

## 2024-03-05 DIAGNOSIS — E860 Dehydration: Secondary | ICD-10-CM | POA: Diagnosis present

## 2024-03-05 DIAGNOSIS — D649 Anemia, unspecified: Secondary | ICD-10-CM | POA: Diagnosis not present

## 2024-03-05 DIAGNOSIS — K209 Esophagitis, unspecified without bleeding: Secondary | ICD-10-CM | POA: Diagnosis not present

## 2024-03-05 DIAGNOSIS — K632 Fistula of intestine: Secondary | ICD-10-CM | POA: Diagnosis present

## 2024-03-05 DIAGNOSIS — R188 Other ascites: Secondary | ICD-10-CM | POA: Diagnosis present

## 2024-03-05 DIAGNOSIS — D630 Anemia in neoplastic disease: Secondary | ICD-10-CM | POA: Diagnosis present

## 2024-03-05 DIAGNOSIS — L03311 Cellulitis of abdominal wall: Secondary | ICD-10-CM | POA: Diagnosis present

## 2024-03-05 DIAGNOSIS — J45909 Unspecified asthma, uncomplicated: Secondary | ICD-10-CM | POA: Diagnosis present

## 2024-03-05 DIAGNOSIS — Z980 Intestinal bypass and anastomosis status: Secondary | ICD-10-CM | POA: Diagnosis not present

## 2024-03-05 DIAGNOSIS — K838 Other specified diseases of biliary tract: Secondary | ICD-10-CM | POA: Diagnosis not present

## 2024-03-05 DIAGNOSIS — E785 Hyperlipidemia, unspecified: Secondary | ICD-10-CM | POA: Diagnosis present

## 2024-03-05 DIAGNOSIS — G893 Neoplasm related pain (acute) (chronic): Secondary | ICD-10-CM | POA: Diagnosis not present

## 2024-03-05 DIAGNOSIS — R112 Nausea with vomiting, unspecified: Secondary | ICD-10-CM | POA: Diagnosis not present

## 2024-03-05 DIAGNOSIS — G8929 Other chronic pain: Secondary | ICD-10-CM | POA: Diagnosis present

## 2024-03-05 DIAGNOSIS — R627 Adult failure to thrive: Secondary | ICD-10-CM | POA: Diagnosis present

## 2024-03-05 DIAGNOSIS — F418 Other specified anxiety disorders: Secondary | ICD-10-CM | POA: Diagnosis not present

## 2024-03-05 DIAGNOSIS — Z931 Gastrostomy status: Secondary | ICD-10-CM | POA: Diagnosis not present

## 2024-03-05 DIAGNOSIS — C7951 Secondary malignant neoplasm of bone: Secondary | ICD-10-CM | POA: Diagnosis present

## 2024-03-05 DIAGNOSIS — Z8249 Family history of ischemic heart disease and other diseases of the circulatory system: Secondary | ICD-10-CM | POA: Diagnosis not present

## 2024-03-05 DIAGNOSIS — T182XXA Foreign body in stomach, initial encounter: Secondary | ICD-10-CM | POA: Diagnosis not present

## 2024-03-05 DIAGNOSIS — Z79811 Long term (current) use of aromatase inhibitors: Secondary | ICD-10-CM | POA: Diagnosis not present

## 2024-03-05 DIAGNOSIS — R7989 Other specified abnormal findings of blood chemistry: Secondary | ICD-10-CM | POA: Diagnosis present

## 2024-03-05 DIAGNOSIS — C786 Secondary malignant neoplasm of retroperitoneum and peritoneum: Secondary | ICD-10-CM | POA: Diagnosis present

## 2024-03-05 DIAGNOSIS — K8689 Other specified diseases of pancreas: Secondary | ICD-10-CM | POA: Diagnosis not present

## 2024-03-05 DIAGNOSIS — K315 Obstruction of duodenum: Secondary | ICD-10-CM | POA: Diagnosis present

## 2024-03-05 LAB — BASIC METABOLIC PANEL WITH GFR
Anion gap: 13 (ref 5–15)
BUN: 15 mg/dL (ref 6–20)
CO2: 26 mmol/L (ref 22–32)
Calcium: 8.9 mg/dL (ref 8.9–10.3)
Chloride: 98 mmol/L (ref 98–111)
Creatinine, Ser: 0.72 mg/dL (ref 0.44–1.00)
GFR, Estimated: >60 mL/min (ref >60)
Glucose, Bld: 100 mg/dL — ABNORMAL HIGH (ref 70–99)
Potassium: 3.8 mmol/L (ref 3.5–5.1)
Sodium: 137 mmol/L (ref 135–145)

## 2024-03-05 LAB — CBC WITH DIFFERENTIAL/PLATELET
Abs Immature Granulocytes: 0.25 K/uL — ABNORMAL HIGH (ref 0.00–0.07)
Abs Immature Granulocytes: 0.22 K/uL — ABNORMAL HIGH (ref 0.00–0.07)
Basophils Absolute: 0 K/uL (ref 0.0–0.1)
Basophils Absolute: 0 K/uL (ref 0.0–0.1)
Basophils Relative: 1 %
Basophils Relative: 1 %
Eosinophils Absolute: 0 K/uL (ref 0.0–0.5)
Eosinophils Absolute: 0 K/uL (ref 0.0–0.5)
Eosinophils Relative: 1 %
Eosinophils Relative: 1 %
HCT: 23.7 % — ABNORMAL LOW (ref 36.0–46.0)
HCT: 22.4 % — ABNORMAL LOW (ref 36.0–46.0)
Hemoglobin: 7.6 g/dL — ABNORMAL LOW (ref 12.0–15.0)
Hemoglobin: 7 g/dL — ABNORMAL LOW (ref 12.0–15.0)
Immature Granulocytes: 8 %
Immature Granulocytes: 8 %
Lymphocytes Relative: 31 %
Lymphocytes Relative: 31 %
Lymphs Abs: 1 K/uL (ref 0.7–4.0)
Lymphs Abs: 0.9 K/uL (ref 0.7–4.0)
MCH: 25.9 pg — ABNORMAL LOW (ref 26.0–34.0)
MCH: 25.5 pg — ABNORMAL LOW (ref 26.0–34.0)
MCHC: 32.1 g/dL (ref 30.0–36.0)
MCHC: 31.3 g/dL (ref 30.0–36.0)
MCV: 80.6 fL (ref 80.0–100.0)
MCV: 81.5 fL (ref 80.0–100.0)
Monocytes Absolute: 0.4 K/uL (ref 0.1–1.0)
Monocytes Absolute: 0.3 K/uL (ref 0.1–1.0)
Monocytes Relative: 13 %
Monocytes Relative: 12 %
Neutro Abs: 1.5 K/uL — ABNORMAL LOW (ref 1.7–7.7)
Neutro Abs: 1.4 K/uL — ABNORMAL LOW (ref 1.7–7.7)
Neutrophils Relative %: 46 %
Neutrophils Relative %: 47 %
Platelets: 138 K/uL — ABNORMAL LOW (ref 150–400)
Platelets: 138 K/uL — ABNORMAL LOW (ref 150–400)
RBC: 2.94 MIL/uL — ABNORMAL LOW (ref 3.87–5.11)
RBC: 2.75 MIL/uL — ABNORMAL LOW (ref 3.87–5.11)
RDW: 18.7 % — ABNORMAL HIGH (ref 11.5–15.5)
RDW: 18.9 % — ABNORMAL HIGH (ref 11.5–15.5)
Smear Review: NORMAL
WBC: 3.2 K/uL — ABNORMAL LOW (ref 4.0–10.5)
WBC: 2.9 K/uL — ABNORMAL LOW (ref 4.0–10.5)
nRBC: 0 % (ref 0.0–0.2)
nRBC: 0 % (ref 0.0–0.2)

## 2024-03-05 LAB — I-STAT CG4 LACTIC ACID, ED: Lactic Acid, Venous: 0.8 mmol/L (ref 0.5–1.9)

## 2024-03-05 LAB — COMPREHENSIVE METABOLIC PANEL WITH GFR
ALT: 45 U/L — ABNORMAL HIGH (ref 0–44)
AST: 47 U/L — ABNORMAL HIGH (ref 15–41)
Albumin: 3.3 g/dL — ABNORMAL LOW (ref 3.5–5.0)
Alkaline Phosphatase: 1109 U/L — ABNORMAL HIGH (ref 38–126)
Anion gap: 14 (ref 5–15)
BUN: 18 mg/dL (ref 6–20)
CO2: 28 mmol/L (ref 22–32)
Calcium: 8.9 mg/dL (ref 8.9–10.3)
Chloride: 96 mmol/L — ABNORMAL LOW (ref 98–111)
Creatinine, Ser: 0.92 mg/dL (ref 0.44–1.00)
GFR, Estimated: >60 mL/min (ref >60)
Glucose, Bld: 108 mg/dL — ABNORMAL HIGH (ref 70–99)
Potassium: 2.7 mmol/L — CL (ref 3.5–5.1)
Sodium: 137 mmol/L (ref 135–145)
Total Bilirubin: 0.4 mg/dL (ref 0.0–1.2)
Total Protein: 6.1 g/dL — ABNORMAL LOW (ref 6.5–8.1)

## 2024-03-05 LAB — MAGNESIUM: Magnesium: 2.1 mg/dL (ref 1.7–2.4)

## 2024-03-05 MED ORDER — SODIUM CHLORIDE 0.9 % IV SOLN
2.0000 g | Freq: Once | INTRAVENOUS | Status: AC
  Administered 2024-03-05: 2 g via INTRAVENOUS
  Filled 2024-03-05: qty 20

## 2024-03-05 MED ORDER — ACETAMINOPHEN 325 MG PO TABS: 650.0000 mg | ORAL_TABLET | Freq: Four times a day (QID) | ORAL | Status: DC | PRN

## 2024-03-05 MED ORDER — HYDROMORPHONE HCL 1 MG/ML IJ SOLN
1.0000 mg | Freq: Once | INTRAMUSCULAR | Status: AC
  Administered 2024-03-05: 1 mg via INTRAVENOUS
  Filled 2024-03-05: qty 1

## 2024-03-05 MED ORDER — POTASSIUM CHLORIDE 10 MEQ/100ML IV SOLN
10.0000 meq | INTRAVENOUS | Status: AC
  Administered 2024-03-05 (×3): 10 meq via INTRAVENOUS
  Filled 2024-03-05 (×3): qty 100

## 2024-03-05 MED ORDER — SODIUM CHLORIDE 0.9% FLUSH
10.0000 mL | Freq: Two times a day (BID) | INTRAVENOUS | Status: DC
  Administered 2024-03-05 – 2024-03-08 (×7): 10 mL
  Administered 2024-03-09: 20 mL

## 2024-03-05 MED ORDER — SODIUM CHLORIDE 0.9 % IV SOLN: 1.0000 g | Freq: Once | INTRAVENOUS | Status: DC

## 2024-03-05 MED ORDER — PIPERACILLIN-TAZOBACTAM 3.375 G IVPB
3.3750 g | Freq: Three times a day (TID) | INTRAVENOUS | Status: DC
  Administered 2024-03-05 – 2024-03-07 (×7): 3.375 g via INTRAVENOUS
  Filled 2024-03-05 (×7): qty 50

## 2024-03-05 MED ORDER — MAGNESIUM SULFATE 2 GM/50ML IV SOLN
2.0000 g | Freq: Once | INTRAVENOUS | Status: AC
  Administered 2024-03-05: 2 g via INTRAVENOUS
  Filled 2024-03-05: qty 50

## 2024-03-05 MED ORDER — BOOST / RESOURCE BREEZE PO LIQD CUSTOM
1.0000 | Freq: Three times a day (TID) | ORAL | Status: DC
  Administered 2024-03-06: 1 via ORAL

## 2024-03-05 MED ORDER — IOHEXOL 300 MG/ML  SOLN
100.0000 mL | Freq: Once | INTRAMUSCULAR | Status: AC | PRN
  Administered 2024-03-05: 100 mL via INTRAVENOUS

## 2024-03-05 MED ORDER — METOCLOPRAMIDE HCL 5 MG/ML IJ SOLN
10.0000 mg | Freq: Once | INTRAMUSCULAR | Status: AC
  Administered 2024-03-05: 10 mg via INTRAVENOUS
  Filled 2024-03-05: qty 2

## 2024-03-05 MED ORDER — KCL-LACTATED RINGERS-D5W 20 MEQ/L IV SOLN
INTRAVENOUS | Status: DC
  Filled 2024-03-05: qty 1000

## 2024-03-05 MED ORDER — HYDROMORPHONE HCL 1 MG/ML IJ SOLN
1.0000 mg | INTRAMUSCULAR | Status: DC | PRN
  Administered 2024-03-05 – 2024-03-09 (×18): 1 mg via INTRAVENOUS
  Filled 2024-03-05 (×17): qty 1

## 2024-03-05 MED ORDER — POTASSIUM CHLORIDE 10 MEQ/100ML IV SOLN
10.0000 meq | INTRAVENOUS | Status: AC
  Administered 2024-03-05 (×6): 10 meq via INTRAVENOUS
  Filled 2024-03-05 (×4): qty 100

## 2024-03-05 MED ORDER — CHLORHEXIDINE GLUCONATE CLOTH 2 % EX PADS
6.0000 | MEDICATED_PAD | Freq: Every day | CUTANEOUS | Status: DC
  Administered 2024-03-05 – 2024-03-08 (×4): 6 via TOPICAL

## 2024-03-05 MED ORDER — SODIUM CHLORIDE 0.9 % IV SOLN
12.5000 mg | Freq: Four times a day (QID) | INTRAVENOUS | Status: DC | PRN
  Administered 2024-03-05 – 2024-03-09 (×5): 12.5 mg via INTRAVENOUS
  Filled 2024-03-05 (×5): qty 12.5

## 2024-03-05 MED ORDER — ONDANSETRON HCL 4 MG/2ML IJ SOLN
4.0000 mg | Freq: Four times a day (QID) | INTRAMUSCULAR | Status: DC | PRN
  Administered 2024-03-05 – 2024-03-09 (×6): 4 mg via INTRAVENOUS
  Filled 2024-03-05 (×6): qty 2

## 2024-03-05 MED ORDER — MAGNESIUM SULFATE 50 % IJ SOLN
2.0000 g | Freq: Once | INTRAMUSCULAR | Status: DC
Start: 2024-03-05 — End: 2024-03-05

## 2024-03-05 MED ORDER — POTASSIUM CHLORIDE 2 MEQ/ML IV SOLN
INTRAVENOUS | Status: AC
  Filled 2024-03-05 (×3): qty 1000

## 2024-03-05 MED ORDER — METRONIDAZOLE 500 MG/100ML IV SOLN
500.0000 mg | Freq: Once | INTRAVENOUS | Status: AC
Start: 2024-03-05 — End: 2024-03-05
  Administered 2024-03-05: 500 mg via INTRAVENOUS
  Filled 2024-03-05: qty 100

## 2024-03-05 MED ORDER — ACETAMINOPHEN 650 MG RE SUPP: 650.0000 mg | Freq: Four times a day (QID) | RECTAL | Status: DC | PRN

## 2024-03-05 MED ORDER — SODIUM CHLORIDE 0.9% FLUSH: 10.0000 mL | INTRAVENOUS | Status: DC | PRN

## 2024-03-05 NOTE — Plan of Care (Signed)

## 2024-03-05 NOTE — Plan of Care (Signed)
   Problem: Clinical Measurements: Goal: Respiratory complications will improve Outcome: Progressing Goal: Cardiovascular complication will be avoided Outcome: Progressing

## 2024-03-05 NOTE — Progress Notes (Signed)
 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  She was recently admitted with low hemoglobin and found to have abdominal fluid collection concerning for abscess however she left AMA and went home.  Came back to the emergency room on surgeons and oncologist advised.  Patient has persistent pain in the epigastric area and G-tube area along with nausea and vomiting.  At the emergency room hemodynamically stable.  Hemoglobin 7.5.  Potassium 2.7.  CT scan consistent with abdominal wall abscess.  Admitted with IV antibiotics.  Surgery consulted.  Subjective: Patient seen and examined.  Complains of cough upper abdominal pain.  Denies any nausea or vomiting.  She is expecting for J-tube to come out.  Assessment & Plan:   Abdominal wall abscess with inflammation, presence of indwelling J-tube.  Persistent drainage from the previous G-tube site. IV antibiotics.  NPO.  IV fluids.  Surgery to follow-up.  Debridement versus localized drainage. Adequate pain medications.  Severe hypokalemia: Due to inadequate oral intake.  Potassium replaced.  Monitor.  Breast cancer with metastasis: Pancreatic lesion with peritoneal carcinomatosis.  She is scheduled to start on chemotherapy this week.  Hopefully her infection gets controlled and she can start chemotherapy.     DVT prophylaxis: SCDs Start: 03/05/24 9380   Code Status: Full code Family Communication: None at the bedside Disposition Plan: Status is: Inpatient Remains inpatient appropriate because: Significant symptoms, antibiotics and IV pain medications     Consultants:  General  Surgery  Procedures:  None  Antimicrobials:  Zosyn  9/14     Objective: Vitals:   03/05/24 0400 03/05/24 0430 03/05/24 0453 03/05/24 0700  BP: 119/74 108/74  94/60  Pulse: 70 68  60  Resp: 16 17  18   Temp:   97.6 F (36.4 C) 97.8 F (36.6 C)  TempSrc:   Oral Oral  SpO2: 99% 98%  99%  Weight:      Height:        Intake/Output Summary (Last 24 hours) at 03/05/2024 1120 Last data filed at 03/05/2024 0554 Gross per 24 hour  Intake 1050 ml  Output --  Net 1050 ml   Filed Weights   03/04/24 2053  Weight: 72.6 kg    Examination:  General exam: Appears calm and comfortable.  On room air. Respiratory system: Clear to auscultation. Respiratory effort normal. Cardiovascular system: S1 & S2 heard, RRR. No JVD, murmurs, rubs, gallops or clicks. No pedal edema. Gastrointestinal system: Abdomen soft.  Mild tenderness along the epigastrium and left lower quadrant. She has a midline J-tube with some drainage around it.  Not used. Patient has Dakins pouch treated on the left lateral quadrant with minimal cloudy drainage. Central nervous system: Alert and oriented. No focal neurological deficits. Extremities: Symmetric 5 x 5 power. Skin: No rashes, lesions or ulcers Psychiatry: Judgement and insight appear normal. Mood & affect appropriate.     Data Reviewed: I have personally reviewed following labs and imaging studies  CBC: Recent Labs  Lab 02/27/24 1531 02/27/24 1845 02/27/24 2027 03/04/24 2305 03/04/24 2316  WBC 2.7* 2.7*  --  3.2*  --   NEUTROABS 1.4* 1.2*  --  1.5*  --   HGB 6.9* 6.2*  5.4* 7.6* 7.5*  HCT 20.2* 19.9* 16.0* 23.7* 22.0*  MCV 75.1* 79.6*  --  80.6  --   PLT 108* 120*  --  138*  --    Basic Metabolic Panel: Recent Labs  Lab 02/27/24 1531 02/27/24 1845 02/27/24 2027 03/04/24 2305 03/04/24 2316  NA 136 136 136 137 136  K 3.3* 3.4* 3.3* 2.7* 2.7*  CL 101 101 101 96* 93*  CO2 26 22  --  28  --   GLUCOSE 112* 98 99 108* 110*  BUN 9 8 6 18 18    CREATININE 0.51 0.56 0.70 0.92 1.10*  CALCIUM  8.2* 8.5*  --  8.9  --   MG 1.4* 1.6*  --   --   --    GFR: Estimated Creatinine Clearance: 67.6 mL/min (A) (by C-G formula based on SCr of 1.1 mg/dL (H)). Liver Function Tests: Recent Labs  Lab 02/27/24 1531 02/27/24 1845 03/04/24 2305  AST 14* 19 47*  ALT 35 40 45*  ALKPHOS 809* 811* 1,109*  BILITOT 0.7 0.6 0.4  PROT 6.6 6.0* 6.1*  ALBUMIN  3.4* 3.1* 3.3*   No results for input(s): LIPASE, AMYLASE in the last 168 hours. No results for input(s): AMMONIA in the last 168 hours. Coagulation Profile: Recent Labs  Lab 02/27/24 1845  INR 1.2   Cardiac Enzymes: No results for input(s): CKTOTAL, CKMB, CKMBINDEX, TROPONINI in the last 168 hours. BNP (last 3 results) No results for input(s): PROBNP in the last 8760 hours. HbA1C: No results for input(s): HGBA1C in the last 72 hours. CBG: No results for input(s): GLUCAP in the last 168 hours. Lipid Profile: No results for input(s): CHOL, HDL, LDLCALC, TRIG, CHOLHDL, LDLDIRECT in the last 72 hours. Thyroid  Function Tests: No results for input(s): TSH, T4TOTAL, FREET4, T3FREE, THYROIDAB in the last 72 hours. Anemia Panel: No results for input(s): VITAMINB12, FOLATE, FERRITIN, TIBC, IRON, RETICCTPCT in the last 72 hours. Sepsis Labs: Recent Labs  Lab 02/27/24 2025 02/27/24 2227 03/04/24 2317 03/05/24 0138  LATICACIDVEN 0.4* 0.3* 1.1 0.8    Recent Results (from the past 240 hours)  Blood Culture (routine x 2)     Status: None   Collection Time: 02/27/24  6:44 PM   Specimen: BLOOD  Result Value Ref Range Status   Specimen Description   Final    BLOOD SITE NOT SPECIFIED Performed at [facility], [address], [place], [place] [address]    Special Requests   Final    Blood Culture results may not be optimal due to an inadequate volume of blood received in culture bottles BOTTLES DRAWN AEROBIC AND  ANAEROBIC Performed at [facility], [address] [place], [place] [address]    Culture   Final    NO GROWTH 5 DAYS Performed at [facility], [address] [place], [place] [address]    Report Status 03/03/2024 FINAL  Final  Blood Culture (routine x 2)     Status: None   Collection Time: 02/27/24  6:45 PM   Specimen: BLOOD  Result Value Ref Range Status   Specimen Description   Final    BLOOD PORTA CATH Performed at [facility], [address] [place], [place] [address]    Special Requests   Final    Blood Culture adequate volume BOTTLES DRAWN AEROBIC AND ANAEROBIC Performed at [facility], [address] [place], [place] [address]    Culture   Final    NO  GROWTH 5 DAYS Performed at [facility], [address] [place], [place] [address]    Report Status 03/03/2024 FINAL  Final  Resp panel by RT-PCR (RSV, Flu A&B, Covid) Anterior Nasal Swab     Status: None   Collection Time: 02/27/24  7:20 PM   Specimen: Anterior Nasal Swab  Result Value Ref Range Status   SARS Coronavirus 2 by RT PCR NEGATIVE NEGATIVE Final    Comment: (NOTE) SARS-CoV-2 target nucleic acids are NOT DETECTED.  The SARS-CoV-2 RNA is generally detectable in upper respiratory specimens during the acute phase of infection. The lowest concentration of SARS-CoV-2 viral copies this assay can detect is 138 copies/mL. A negative result does not preclude SARS-Cov-2 infection and should not be used as the sole basis for treatment or other patient management decisions. A negative result may occur with  improper specimen collection/handling, submission of specimen other than nasopharyngeal swab, presence of viral mutation(s) within the areas targeted by this assay, and inadequate number of viral copies(<138 copies/mL). A negative result must be combined with clinical observations, patient history, and epidemiological information. The  expected result is Negative.  Fact Sheet for Patients:  BloggerCourse.com  Fact Sheet for Healthcare Providers:  SeriousBroker.it  This test is no t yet approved or cleared by the [place]  FDA and  has been authorized for detection and/or diagnosis of SARS-CoV-2 by FDA under an Emergency Use Authorization (EUA). This EUA will remain  in effect (meaning this test can be used) for the duration of the COVID-19 declaration under Section 564(b)(1) of the Act, 21 U.S.C.section 360bbb-3(b)(1), unless the authorization is terminated  or revoked sooner.       Influenza A by PCR NEGATIVE NEGATIVE Final   Influenza B by PCR NEGATIVE NEGATIVE Final    Comment: (NOTE) The Xpert Xpress SARS-CoV-2/FLU/RSV plus assay is intended as an aid in the diagnosis of influenza from Nasopharyngeal swab specimens and should not be used as a sole basis for treatment. Nasal washings and aspirates are unacceptable for Xpert Xpress SARS-CoV-2/FLU/RSV testing.  Fact Sheet for Patients: BloggerCourse.com  Fact Sheet for Healthcare Providers: SeriousBroker.it  This test is not yet approved or cleared by the [place]  FDA and has been authorized for detection and/or diagnosis of SARS-CoV-2 by FDA under an Emergency Use Authorization (EUA). This EUA will remain in effect (meaning this test can be used) for the duration of the COVID-19 declaration under Section 564(b)(1) of the Act, 21 U.S.C. section 360bbb-3(b)(1), unless the authorization is terminated or revoked.     Resp Syncytial Virus by PCR NEGATIVE NEGATIVE Final    Comment: (NOTE) Fact Sheet for Patients: BloggerCourse.com  Fact Sheet for Healthcare Providers: SeriousBroker.it  This test is not yet approved or cleared by the [place]  FDA and has been authorized for detection and/or  diagnosis of SARS-CoV-2 by FDA under an Emergency Use Authorization (EUA). This EUA will remain in effect (meaning this test can be used) for the duration of the COVID-19 declaration under Section 564(b)(1) of the Act, 21 U.S.C. section 360bbb-3(b)(1), unless the authorization is terminated or revoked.  Performed at [facility], [address], [place], [place] [address]          Radiology Studies: CT ABDOMEN PELVIS W CONTRAST Result Date: 03/05/2024 EXAM: CT ABDOMEN AND PELVIS WITH CONTRAST 03/05/2024 01:00:32 AM TECHNIQUE: CT of the abdomen and pelvis was performed with the administration of intravenous contrast. Multiplanar reformatted images are provided for review. Automated exposure control, iterative  reconstruction, and/or weight-based adjustment of the mA/kV was utilized to reduce the radiation dose to as low as reasonably achievable. COMPARISON: None available. CLINICAL HISTORY: Re-evaluation of previously diagnosed abdominal abscess of 02/27/24. FINDINGS: LOWER CHEST: No acute abnormality. LIVER: Focal fatty hepatic infiltration adjacent to the falciform ligament. No enhancing intrahepatic mass. GALLBLADDER AND BILE DUCTS: The gallbladder is unremarkable. There is progressive dilation of the extrahepatic bile duct which measures now 15 mm in diameter and demonstrates a relatively abrupt caliber change within the head of the pancreas best seen on coronal image 64/8. Known pancreatic head mass is not clearly identified on this examination. No intrahepatic biliary ductal dilation. SPLEEN: No acute abnormality. PANCREAS: No acute abnormality. ADRENAL GLANDS: No acute abnormality. KIDNEYS, URETERS AND BLADDER: Simple cortical cysts are seen within the kidneys bilaterally. The kidneys are otherwise unremarkable. No follow up imaging is recommended for these lesions. No stones in the kidneys or ureters. No hydronephrosis. No perinephric or periureteral stranding. Urinary  bladder is unremarkable. GI AND BOWEL: Partial gastrectomy, gastrojejunostomy, and balloon retention gastrostomy placement are again identified. Moderate pancolonic diverticulosis. The stomach, small bowel, and large bowel are otherwise unremarkable. Appendix normal. PERITONEUM AND RETROPERITONEUM: There is omental infiltration in keeping with peritoneal carcinomatosis, unchanged from prior examination and best visualized within the periumbilical region. No ascites. No free air. VASCULATURE: Aorta is normal in caliber. LYMPH NODES: No lymphadenopathy. REPRODUCTIVE ORGANS: Uterus absent. No adnexal masses. BONES AND SOFT TISSUES: Diffuse mixed lytic and sclerotic patterns again seen within the axial skeleton in keeping with widespread osseous metastatic disease. Remote pathologic fracture of L3 with mild loss of height again noted. No retropulsion or listhesis. No acute bone abnormality. The previously identified complex left upper quadrant abdominal wall abscess has improved with a residual 1.4 x 1.8 cm fluid collection seen subjacent to the umbilical abdominal wall within the peritoneum and a pancreatic collection of gas and debris between the internal and external oblique musculature measuring 4 mm x 22 mm communicating with the previous catheter tract. There is persistent abdominal wall thickening and interfascial infiltration in keeping with inflammatory change. No new loculated fluid collections are identified. IMPRESSION: 1. Improved left upper quadrant abdominal wall abscess with residual 1.4 x 1.8 cm fluid collection and persistent inflammatory change. No new loculated fluid collections. 2. Progressive dilation of the extrahepatic bile duct to 15 mm with abrupt caliber change within the head of the pancreas. Known pancreatic head mass is not clearly identified. No intrahepatic biliary ductal dilation. 3. Omental infiltration consistent with peritoneal carcinomatosis, unchanged from prior examination. 4.  Diffuse mixed lytic and sclerotic patterns within the axial skeleton consistent with widespread osseous metastatic disease. No acute bone abnormality. Electronically signed by: Dorethia Molt MD 03/05/2024 01:32 AM EDT RP Workstation: [id number]        Scheduled Meds:  Chlorhexidine  Gluconate Cloth  6 each Topical Daily   sodium chloride  flush  10-40 mL Intracatheter Q12H   Continuous Infusions:  dextrose  5% lactated ringers  1,000 mL with potassium chloride  20 mEq infusion 75 mL/hr at 03/05/24 0729   piperacillin -tazobactam (ZOSYN )  IV 3.375 g (03/05/24 0801)   potassium chloride  10 mEq (03/05/24 1057)     LOS: 0 days    Time spent: 35 minutes.  Same-day admit.  No charge visit.    Renato Applebaum, MD [facility]

## 2024-03-05 NOTE — ED Provider Notes (Signed)
  Physical Exam  BP 104/67 (BP Location: Left Arm)   Pulse (!) 106   Temp 98.7 F (37.1 C) (Oral)   Resp 18   Ht 5' 11 (1.803 m)   Wt 72.6 kg   SpO2 100%   BMI 22.32 kg/m   Physical Exam  Procedures  Procedures  ED Course / MDM   Clinical Course as of 03/05/24 0257  Mon Mar 05, 2024  0011 Patient returns after [facility] 9/9 from admission for anemia, fever and intra-abdominal abscess. CA patient on chemo. No fever x 2 days.   Pain addressed on arrival to ED. On review of the CT 9/8, it was felt study with addition of PO contrast may be helpful. Obtained repeat CT tonight and PO CM attempted but patient could not tolerate.   Pain improved with Dilaudid . Potassium found to be low at 2.7. IV potassium ordered.   Patient discussed with surgery who will see in the morning in consultation. Dr. Vanderbilt is aware, secure chat sent to surgical team per his request.  [SU]  [phone] Consult Dr. Franky who is agreeable to doing this patient to his service.  Appreciate collaboration of the care of this patient. [RS]    Clinical Course User Index [RS] Dalanie Kisner, Pleasant SAUNDERS, PA-C [SU] Odell Balls, PA-C   Medical Decision Making Amount and/or Complexity of Data Reviewed Labs: ordered. Radiology: ordered.  Risk Prescription drug management. Decision regarding hospitalization.   Care of this patient assumed from preceding ED provider Balls, PA-C at time of shift change.  Please see her associated note for further insight to the patient's ED course.  In brief patient is a 53 year old female with history of metastatic breast cancer, who recently left the hospital AMA on 9/9 after she was admitted pending surgical consult for left peritoneal abscess at site of prior J-tube.  Returns at this time at the direction of her oncologist for readmission.  Presents with abdominal pain, tachycardia .  Anemia with hemoglobin of 7.6 improved from baseline.  CMP with hypokalemia of 2.7, AST/ALT mildly  elevated to 47/45, alk phos significant elevated to 1109.  Lactic is normal.  CT of the abdomen and pelvis revealed improved upper quadrant abdominal wall abscess with residual 1.4 x 1.8 fluid collection persistent and family Tory changes.  No new loculated collections.  Progressive dilatation of the CBD to 15 mm with abrupt caliber change at the head of the pancreas; known pancreatic head mass.  Peritoneal carcinomatosis unchanged.  Preceding ED provider consulted with surgeon Dr. Vanderbilt who states patient will be seeing the patient today.  Patient was pending CT imaging and hospitalist admission at time of shift change.  Patient admitted to hospitalist as above, general surgery will see him today. I appreciate their collaboration in the care of this patient.  This chart was dictated using voice recognition software, Dragon. Despite the best efforts of this provider to proofread and correct errors, errors may still occur which can change documentation meaning.      Bobette Pleasant SAUNDERS, PA-C 03/05/24 0258    Carita Senior, MD 03/05/24 [phone]

## 2024-03-05 NOTE — Progress Notes (Addendum)
 [facility] Progress Note     Subjective: CC:  Known to our service with history of metastatic breast CA and pancreatic head lesion s/p lap GJ bypass followed by G tube and J tube placement 12/2023. J tube removed in August due to causing obstruction.   Patient was advised by her doctor to come to the ED due to an intra-abdominal fluid collection noted on CT scan from 9/8.  Patient reports drainage round her G tube, which she does not use. Reports she was tolerating some PO but has a poor appetite and has had some vomiting in the last 48 hours, last episode yesterday in ED.  Reports some chills at home. Lives with her daughter. Applies her own eakin pouch to previous J tube site.   Objective: Vital signs in last 24 hours: Temp:  [97.6 F (36.4 C)-98.7 F (37.1 C)] 97.8 F (36.6 C) (09/15 0700) Pulse Rate:  [60-106] 60 (09/15 0700) Resp:  [16-18] 18 (09/15 0700) BP: (94-119)/(60-75) 94/60 (09/15 0700) SpO2:  [98 %-100 %] 99 % (09/15 0700) Weight:  [72.6 kg] 72.6 kg (09/14 2053) Last BM Date :  (PTA)  Intake/Output from previous day: 09/14 0701 - 09/15 0700 In: 1050 [IV Piggyback:1050] Out: -  Intake/Output this shift: No intake/output data recorded.  PE: Gen:  Alert, NAD, pleasant Card:  Regular rate and rhythm Pulm:  Normal effor Abd: Soft, non-tender, non-distended  G tube with external bumper 4-5 cm from skin, synched down 3-4 cm by me but still mobile.   J tube site with eakins pouch with scant cloudy fluid  No abdominal wall cellulitis or warmth. Skin: warm and dry, no rashes  Psych: A&Ox3   Lab Results:  Recent Labs    03/04/24 2305 03/04/24 2316  WBC 3.2*  --   HGB 7.6* 7.5*  HCT 23.7* 22.0*  PLT 138*  --    BMET Recent Labs    03/04/24 2305 03/04/24 2316  NA 137 136  K 2.7* 2.7*  CL 96* 93*  CO2 28  --   GLUCOSE 108* 110*  BUN 18 18  CREATININE 0.92 1.10*  CALCIUM  8.9  --    PT/INR No results for input(s): LABPROT, INR in the  last 72 hours. CMP     Component Value Date/Time   NA 136 03/04/2024 2316   NA 144 11/30/2023 1000   K 2.7 (LL) 03/04/2024 2316   CL 93 (L) 03/04/2024 2316   CO2 28 03/04/2024 2305   GLUCOSE 110 (H) 03/04/2024 2316   BUN 18 03/04/2024 2316   BUN 19 11/30/2023 1000   CREATININE 1.10 (H) 03/04/2024 2316   CREATININE 0.51 02/27/2024 1531   CALCIUM  8.9 03/04/2024 2305   PROT 6.1 (L) 03/04/2024 2305   PROT 7.3 11/30/2023 1000   ALBUMIN  3.3 (L) 03/04/2024 2305   ALBUMIN  4.5 11/30/2023 1000   AST 47 (H) 03/04/2024 2305   AST 14 (L) 02/27/2024 1531   ALT 45 (H) 03/04/2024 2305   ALT 35 02/27/2024 1531   ALKPHOS 1,109 (H) 03/04/2024 2305   BILITOT 0.4 03/04/2024 2305   BILITOT 0.7 02/27/2024 1531   GFRNONAA >60 03/04/2024 2305   GFRNONAA >60 02/27/2024 1531   GFRAA >60 09/07/2019 1340   Lipase     Component Value Date/Time   LIPASE 133 (H) 01/19/2024 1858       Studies/Results: CT ABDOMEN PELVIS W CONTRAST Result Date: 03/05/2024 EXAM: CT ABDOMEN AND PELVIS WITH CONTRAST 03/05/2024 01:00:32 AM TECHNIQUE: CT of the  abdomen and pelvis was performed with the administration of intravenous contrast. Multiplanar reformatted images are provided for review. Automated exposure control, iterative reconstruction, and/or weight-based adjustment of the mA/kV was utilized to reduce the radiation dose to as low as reasonably achievable. COMPARISON: None available. CLINICAL HISTORY: Re-evaluation of previously diagnosed abdominal abscess of 02/27/24. FINDINGS: LOWER CHEST: No acute abnormality. LIVER: Focal fatty hepatic infiltration adjacent to the falciform ligament. No enhancing intrahepatic mass. GALLBLADDER AND BILE DUCTS: The gallbladder is unremarkable. There is progressive dilation of the extrahepatic bile duct which measures now 15 mm in diameter and demonstrates a relatively abrupt caliber change within the head of the pancreas best seen on coronal image 64/8. Known pancreatic head mass is  not clearly identified on this examination. No intrahepatic biliary ductal dilation. SPLEEN: No acute abnormality. PANCREAS: No acute abnormality. ADRENAL GLANDS: No acute abnormality. KIDNEYS, URETERS AND BLADDER: Simple cortical cysts are seen within the kidneys bilaterally. The kidneys are otherwise unremarkable. No follow up imaging is recommended for these lesions. No stones in the kidneys or ureters. No hydronephrosis. No perinephric or periureteral stranding. Urinary bladder is unremarkable. GI AND BOWEL: Partial gastrectomy, gastrojejunostomy, and balloon retention gastrostomy placement are again identified. Moderate pancolonic diverticulosis. The stomach, small bowel, and large bowel are otherwise unremarkable. Appendix normal. PERITONEUM AND RETROPERITONEUM: There is omental infiltration in keeping with peritoneal carcinomatosis, unchanged from prior examination and best visualized within the periumbilical region. No ascites. No free air. VASCULATURE: Aorta is normal in caliber. LYMPH NODES: No lymphadenopathy. REPRODUCTIVE ORGANS: Uterus absent. No adnexal masses. BONES AND SOFT TISSUES: Diffuse mixed lytic and sclerotic patterns again seen within the axial skeleton in keeping with widespread osseous metastatic disease. Remote pathologic fracture of L3 with mild loss of height again noted. No retropulsion or listhesis. No acute bone abnormality. The previously identified complex left upper quadrant abdominal wall abscess has improved with a residual 1.4 x 1.8 cm fluid collection seen subjacent to the umbilical abdominal wall within the peritoneum and a pancreatic collection of gas and debris between the internal and external oblique musculature measuring 4 mm x 22 mm communicating with the previous catheter tract. There is persistent abdominal wall thickening and interfascial infiltration in keeping with inflammatory change. No new loculated fluid collections are identified. IMPRESSION: 1. Improved left  upper quadrant abdominal wall abscess with residual 1.4 x 1.8 cm fluid collection and persistent inflammatory change. No new loculated fluid collections. 2. Progressive dilation of the extrahepatic bile duct to 15 mm with abrupt caliber change within the head of the pancreas. Known pancreatic head mass is not clearly identified. No intrahepatic biliary ductal dilation. 3. Omental infiltration consistent with peritoneal carcinomatosis, unchanged from prior examination. 4. Diffuse mixed lytic and sclerotic patterns within the axial skeleton consistent with widespread osseous metastatic disease. No acute bone abnormality. Electronically signed by: Dorethia Molt MD 03/05/2024 01:32 AM EDT RP Workstation: [id number]    Anti-infectives: Anti-infectives (From admission, onward)    Start     Dose/Rate Route Frequency Ordered Stop   03/05/24 0800  piperacillin -tazobactam (ZOSYN ) IVPB 3.375 g        3.375 g 12.5 mL/hr over 240 Minutes Intravenous Every 8 hours 03/05/24 0710     03/05/24 0245  cefTRIAXone  (ROCEPHIN ) 1 g in sodium chloride  0.9 % 100 mL IVPB  Status:  Discontinued        1 g 200 mL/hr over 30 Minutes Intravenous  Once 03/05/24 0237 03/05/24 0241   03/05/24 0245  metroNIDAZOLE  (FLAGYL ) IVPB 500 mg  500 mg 100 mL/hr over 60 Minutes Intravenous  Once 03/05/24 0237 03/05/24 0554   03/05/24 0245  cefTRIAXone  (ROCEPHIN ) 2 g in sodium chloride  0.9 % 100 mL IVPB        2 g 200 mL/hr over 30 Minutes Intravenous  Once 03/05/24 0242 03/05/24 0452        Assessment/Plan  Chelsea Hansen is a 53 year old lady with a history of metastatic breast cancer and 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 Lyndel and subsequently underwent G tube and J tube placement 01/10/24 when GJ was not draining who was readmitted 8/14 for FTT and G and J tube malfunction/malposition. G tube replaced by IR 8/15. J tube ultimately removed due to  obstruction. Eakins pouch in place, low output. She now presents with vomiting and LUQ intra-abdominal/abdominal wall collection.  - CT on admission with LUQ abdominal/abdominal wall collection 1.4x 1.8 cm (Smaller compared to prior imaging) near J tube site. There is no external evidence of infection/cellulitis. No large intra-abdominal fluid collections to suggests leak from jejunum from history of J tube. No systemic evidence of sepsis. Agree with continuing to treat this area like a fistula from jejunum to skin using an eakins pouch. Can consider oral contrasted study like CT scan with PO contrast or UGI with small bowel follow thru for more information and to prove there is no intestinal leak, but this will unlikely change our current management of the patient. 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 Will discuss further with my attending. Agree with IV abx for now. - CT on admission w/ progressive peritoneal carcinomatosis( Peritoneal bx from 8/15 was consistent with metastatic carcinoma, breast primary) - scheduled to start chemo this week for known metastatic breast CA, per chart review; Dr. Odean, would notify oncology of her admission. - increasing biliary dilation on CT, repeat CMP in AM; bilirubin normal. Alk phos elevated. No jaundice.  consider GI consult for biliary obstruction. - last palliative consult that I see is 8/18 - Full code/ Full scope, consider re-engaging palliative.   FEN - Okay for clear liquids from a C[facility] standpoint, no planned procedures VTE - SCDs, sqh ID - None currently.    LOS: 0 days   I reviewed nursing notes, hospitalist notes, last 24 h vitals and pain scores, last 48 h intake and output, last 24 h labs and trends, and last 24 h imaging results.  This care required moderate level of medical decision  making.   Almarie Pringle, PA-C [facility] Please see Amion for pager number during day hours 7:00am-4:30pm

## 2024-03-05 NOTE — TOC Initial Note (Signed)
 Transition of Care [facility]) - Initial/Assessment Note    Patient Details  Name: Chelsea Hansen MRN: [id number] Date of Birth: 07/23/1970  [facility] CM/SW Contact:    Doneta Glenys DASEN, RN Phone Number: 03/05/2024, 4:22 PM  Clinical Narrative:                 CM spoke with patient and parents Broadus Kicks (Mother)[phone] and Debby (father) [phone] in the roo. Patient verified PCP/insurance;Denied DME, HH, oxygen and SDOH needs;Parents said will transport at discharge. IP CM will follow for nutritional needs   Expected Discharge Plan: Home/Self Care Barriers to Discharge: Continued Medical Work up   Patient Goals and CMS Choice   CMS Medicare.gov Compare Post Acute Care list provided to::  (NA)   [place] ownership interest in [facility].provided to:: Parent NA    Expected Discharge Plan and Services In-house Referral: NA Discharge Planning Services: CM Consult Post Acute Care Choice: NA Living arrangements for the past 2 months: Apartment                 DME Arranged: N/A DME Agency: NA       HH Arranged: NA HH Agency: NA        Prior Living Arrangements/Services Living arrangements for the past 2 months: Apartment   Patient language and need for interpreter reviewed:: Yes Do you feel safe going back to the place where you live?: Yes      Need for Family Participation in Patient Care: Yes (Comment) Care giver support system in place?: Yes (comment)   Criminal Activity/Legal Involvement Pertinent to Current Situation/Hospitalization: No - Comment as needed  Activities of Daily Living   ADL Screening (condition at time of admission) Independently performs ADLs?: Yes (appropriate for developmental age) Is the patient deaf or have difficulty hearing?: No Does the patient have difficulty seeing, even when wearing glasses/contacts?: No Does the patient have difficulty concentrating, remembering, or making decisions?:  No  Permission Sought/Granted Permission sought to share information with : Case Manager Permission granted to share information with : Yes, Verbal Permission Granted  Share Information with NAME: Broadus Kicks (Mother)  [phone], Debby Broadus (Father) [phone]           Emotional Assessment Appearance:: Appears stated age Attitude/Demeanor/Rapport: Engaged Affect (typically observed): Appropriate Orientation: : Oriented to Self, Oriented to Place, Oriented to  Time, Oriented to Situation Alcohol / Substance Use: Not Applicable Psych Involvement: No (comment)  Admission diagnosis:  Hypokalemia [E87.6] Abdominal wall abscess [L02.211] Patient Active Problem List   Diagnosis Date Noted   Abdominal wall abscess 03/05/2024   Intraabdominal fluid collection 02/27/2024   Gastrostomy tube present (HCC) 02/10/2024   Gastrostomy site leak (HCC) 02/10/2024   Irritant contact dermatitis associated with digestive stoma 02/10/2024   Protein-calorie malnutrition, severe 02/03/2024   SBO (small bowel obstruction) (HCC) 02/01/2024   Generalized abdominal pain 01/17/2024   Elevated CA 19-9 level 01/16/2024   Hypophosphatemia 01/04/2024   Hypomagnesemia 01/04/2024   Pancreatic lesion 12/29/2023   Unintentional weight loss 12/29/2023   Nausea and vomiting 12/29/2023   Duodenal obstruction 12/29/2023   Abnormal MRI of abdomen 12/26/2023   Abnormal CT of the abdomen 12/23/2023   Elevated liver enzymes 12/23/2023   Acute kidney injury (HCC) 12/23/2023   AKI (acute kidney injury) (HCC) 12/22/2023   Transaminitis 12/22/2023   Pancreatic mass 12/22/2023   Right sided weakness 12/22/2023   Hypotension 12/22/2023   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   Abnormal CT scan, colon 12/14/2023  Acute esophagitis 12/14/2023   Candida esophagitis (HCC) 12/14/2023   Therapeutic opioid-induced constipation (OIC) 12/11/2023   Hypocalcemia 12/10/2023   Fever of unknown origin 12/10/2023   Sinus  tachycardia 12/10/2023   Acute kidney injury superimposed on chronic kidney disease (HCC) 12/05/2023   Electrolyte abnormality 12/05/2023   Dehydration 12/05/2023   Prolonged QT interval 12/05/2023   Intractable nausea and vomiting 11/24/2023   Neoplasm related pain 12/07/2022   Constipation 06/30/2022   Nausea and vomiting in adult 06/29/2022   Hematemesis 06/28/2022   Breast cancer metastasized to bone, right (HCC) 05/20/2022   Supraventricular tachycardia (HCC) 07/08/2021   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   GERD (gastroesophageal reflux disease) 05/27/2021   HLD (hyperlipidemia) 05/27/2021   Essential hypertension 05/27/2021   Pain management contract signed 05/04/2021   Chronic pain syndrome (breast cancer) 05/04/2021   Palpitations 03/25/2021   Sinus bradycardia 03/25/2021   Daytime somnolence 03/25/2021   Snoring 03/25/2021   Obesity (BMI 30-39.9) 03/25/2021   Port-A-Cath in place 01/27/2021   Genetic testing 12/24/2020   Family history of uterine cancer 12/17/2020   Malignant neoplasm of upper-outer quadrant of right breast in female, estrogen receptor positive (HCC) 12/12/2020   Breast cancer (HCC) 12/12/2020   Hypokalemia 01/02/2012   Chronic asthma 01/01/2012   Bradycardia 01/01/2012   Syncope 12/31/2011   Mediastinal adenopathy 12/31/2011   Pancytopenia (HCC) 12/31/2011   Chest pain 12/31/2011   PCP:  [name], NP Pharmacy:   [facility] [address] [place] [address] Phone: [phone] Fax: [phone]  [name] [address] [place] [address] Phone: [phone] Fax: [phone]     Social Drivers of Health (SDOH) Social History: SDOH Screenings   Food Insecurity: No Food Insecurity (03/05/2024)  Recent Concern: Food Insecurity - Food Insecurity Present (02/16/2024)  Housing: Low Risk  (03/05/2024)  Transportation Needs: No Transportation Needs  (03/05/2024)  Utilities: Not At Risk (03/05/2024)  Depression (PHQ2-9): Low Risk  (02/25/2024)  Recent Concern: Depression (PHQ2-9) - Medium Risk (01/25/2024)  Financial Resource Strain: High Risk (02/13/2024)  Physical Activity: Inactive (02/13/2024)  Social Connections: Moderately Isolated (02/13/2024)  Stress: No Stress Concern Present (02/13/2024)  Tobacco Use: Low Risk  (03/05/2024)  Recent Concern: Tobacco Use - High Risk (01/27/2024)   Received from [facility]  Health Literacy: Adequate Health Literacy (02/13/2024)   SDOH Interventions:     Readmission Risk Interventions    03/05/2024    4:03 PM 12/07/2023   12:21 PM 11/26/2023    2:58 PM  Readmission Risk Prevention Plan  Transportation Screening Complete Complete Complete  PCP or Specialist Appt within 3-5 Days   Complete  HRI or Home Care Consult   Complete  Social Work Consult for Recovery Care Planning/Counseling   Complete  Palliative Care Screening   Not Applicable  Medication Review Oceanographer) Complete Complete Complete  PCP or Specialist appointment within 3-5 days of discharge Complete Complete   HRI or Home Care Consult Complete Complete   SW Recovery Care/Counseling Consult Complete Complete   Palliative Care Screening Not Applicable Not Applicable   Skilled Nursing Facility Not Applicable Not Applicable

## 2024-03-05 NOTE — ED Notes (Signed)
 Critical result, Potassium 2/7.  EDP notified

## 2024-03-05 NOTE — H&P (Signed)
 History and Physical    Chelsea Hansen FMW:[id number] DOB: [birth year] DOA: 03/04/2024  Patient coming from: Home.  Chief Complaint: Abdominal pain.  HPI: Chelsea Hansen is a 53 y.o. 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  Patient states over the last 2 days patient was unable to eat anything with constant vomiting.  Denies any diarrhea.  ED Course: In the ER patient CT abdomen pelvis shows improved left upper quadrant abdominal wall abscess with persistent inflammatory changes.  ER physician discussed with Dr. Vanderbilt general surgeon was going to see patient in consult.  Patient also has hypokalemic for which patient is receiving potassium replacement.  Patient has pancytopenia and if there is any further decrease in hemoglobin will need transfusion.  Review of Systems: As per HPI, rest all negative.   Past Medical History:  Diagnosis Date   Allergy    seasonal allergies   Anxiety    on meds   Asthma    uses inhaler   Breast cancer (HCC) 2012   RIGHT lumpectomy   Cancer (HCC) 2022   RIGHT breast lump-dx 2022   Depression    on meds   DVT (deep venous thrombosis) (HCC) 2010   after hysterectomy   Family history of uterine cancer    GERD (gastroesophageal reflux disease)    with certain foods/OTC PRN meds    Headache(784.0)    History of radiation therapy    Bilateral breast- 09/03/21-11/02/21- Dr. Lynwood Nasuti   Hyperlipidemia    on meds   Hypertension    on meds   SVT (supraventricular tachycardia) (HCC)     Past Surgical History:  Procedure Laterality Date   ABDOMINAL HYSTERECTOMY     AXILLARY SENTINEL NODE BIOPSY Left 07/09/2021   Procedure: LEFT AXILLARY SENTINEL NODE BIOPSY;  Surgeon: [name], MD;  Location: [facility];  Service: General;  Laterality: Left;   BALLOON DILATION N/A 12/13/2023   Procedure: MERRILL HODGKIN;  Surgeon: Federico Rosario BROCKS, MD;  Location: [facility];  Service: Gastroenterology;  Laterality: N/A;   BIOPSY  06/29/2022   Procedure: BIOPSY;  Surgeon: Legrand Victory LITTIE DOUGLAS, MD;  Location: [facility];  Service: Gastroenterology;;   BIOPSY OF SKIN SUBCUTANEOUS TISSUE AND/OR MUCOUS MEMBRANE  12/13/2023   Procedure: BIOPSY, SKIN, SUBCUTANEOUS TISSUE, OR MUCOUS MEMBRANE;  Surgeon: Federico Rosario BROCKS, MD;  Location: [facility];  Service: Gastroenterology;;   BREAST EXCISIONAL BIOPSY Right 09/16/2009   BREAST LUMPECTOMY WITH RADIOACTIVE SEED LOCALIZATION Bilateral 07/09/2021   Procedure: BILATERAL BREAST LUMPECTOMY WITH RADIOACTIVE SEED LOCALIZATION;  Surgeon: [name], MD;  Location: [facility];  Service: General;  Laterality: Bilateral;   BREAST SURGERY     lumpectomy   ESOPHAGOGASTRODUODENOSCOPY N/A 12/13/2023   Procedure: EGD (ESOPHAGOGASTRODUODENOSCOPY);  Surgeon: Federico Rosario BROCKS, MD;  Location: [facility];  Service: Gastroenterology;  Laterality: N/A;   ESOPHAGOGASTRODUODENOSCOPY N/A  12/26/2023   Procedure: EGD (ESOPHAGOGASTRODUODENOSCOPY);  Surgeon: Wilhelmenia Aloha Raddle., MD;  Location: [facility];  Service: Gastroenterology;  Laterality: N/A;   ESOPHAGOGASTRODUODENOSCOPY N/A 12/27/2023   Procedure: EGD (ESOPHAGOGASTRODUODENOSCOPY);  Surgeon: Wilhelmenia Aloha Raddle., MD;  Location: [facility];  Service:  Gastroenterology;  Laterality: N/A;   ESOPHAGOGASTRODUODENOSCOPY (EGD) WITH PROPOFOL  N/A 06/29/2022   Procedure: ESOPHAGOGASTRODUODENOSCOPY (EGD) WITH PROPOFOL ;  Surgeon: Legrand Victory LITTIE DOUGLAS, MD;  Location: [facility];  Service: Gastroenterology;  Laterality: N/A;   EUS N/A 12/26/2023   Procedure: ULTRASOUND, UPPER GI TRACT, ENDOSCOPIC;  Surgeon: Wilhelmenia Aloha Raddle., MD;  Location: [facility];  Service: Gastroenterology;  Laterality: N/A;   EUS N/A 12/27/2023   Procedure: ULTRASOUND, UPPER GI TRACT, ENDOSCOPIC;  Surgeon: Wilhelmenia Aloha Raddle., MD;  Location: [facility];  Service: Gastroenterology;  Laterality: N/A;   IR CM INJ ANY COLONIC TUBE W/FLUORO  01/20/2024   IR IMAGING GUIDED PORT INSERTION  02/06/2024   IR REPLC DUODEN/JEJUNO TUBE PERCUT W/FLUORO  02/03/2024   LAPAROSCOPIC INSERTION GASTROSTOMY TUBE N/A 01/10/2024   Procedure: laparoscopic insertion of gastrostomy tube, laparoscopic insertion of jejunostomy tube, upper endoscopy;  Surgeon: Kinsinger, Herlene Righter, MD;  Location: [facility];  Service: General;  Laterality: N/A;  Laproscopic insertion   LAPAROSCOPIC ROUX-EN-Y GASTRIC BYPASS WITH HIATAL HERNIA REPAIR N/A 01/02/2024   Procedure: LAPAROSCOPIC CREATION GASTRJEJONOSTOMY;  Surgeon: Lyndel Deward PARAS, MD;  Location: [facility];  Service: General;  Laterality: N/A;  GASTROJEJUNOSTOMY   PORT-A-CATH REMOVAL Left 07/09/2021   Procedure: REMOVAL PORT-A-CATH;  Surgeon: [name], MD;  Location: [facility];  Service: General;  Laterality: Left;   PORTACATH PLACEMENT Left 01/26/2021   Procedure: INSERTION PORT-A-CATH;  Surgeon: [name], MD;  Location: [facility];  Service: General;  Laterality: Left;   RADIOACTIVE SEED GUIDED AXILLARY SENTINEL LYMPH NODE Right 07/09/2021   Procedure: RADIOACTIVE SEED GUIDED RIGHT AXILLARY SENTINEL LYMPH NODE DISSECTION;  Surgeon: [name], MD;  Location:  SURGERY CENTER;  Service: General;  Laterality: Right;    TUBAL LIGATION       reports that she has never smoked. She has never used smokeless tobacco. She reports that she does not currently use alcohol after a past usage of about 7.0 standard drinks of alcohol per week. She reports that she does not use drugs.  Allergies  Allergen Reactions   Enhertu  [Fam-Trastuzumab  Deruxtec-Nxki] Other (See Comments)    Headache and chest pain. See progress note from 02/16/2024. Patient able to complete infusion.    Family History  Problem Relation Age of Onset   Breast cancer Mother        25s   Hypertension Father    Uterine cancer Maternal Aunt    Ovarian cancer Maternal Aunt    Breast cancer Maternal Aunt        55s   Breast cancer Cousin 24   Uterine cancer Cousin 77   Colon polyps Neg Hx    Colon cancer Neg Hx    Esophageal cancer Neg Hx    Stomach cancer Neg Hx     Prior to Admission medications   Medication Sig Start Date End Date Taking? Authorizing Provider  anastrozole  (ARIMIDEX ) 1 MG tablet Take 1 mg by mouth daily.   Yes [provider]  fentaNYL  (DURAGESIC ) 12 MCG/HR Place 1 patch onto the skin every 3 (three) days. 02/08/24  Yes Samtani, Jai-Gurmukh, MD  lidocaine -prilocaine  (EMLA ) cream Apply to affected area once 02/08/24  Yes [name], [name], MD  montelukast  (SINGULAIR ) 10 MG tablet Take  10 mg by mouth at bedtime.   Yes [provider]  ondansetron  (ZOFRAN ) 8 MG tablet Take 1 tablet (8 mg total) by mouth every 8 (eight) hours as needed for nausea or vomiting. Start on the third day after chemotherapy. 02/08/24  Yes [name], MD  oxyCODONE  (ROXICODONE ) 15 MG immediate release tablet Take 1 tablet (15 mg total) by mouth every 4 (four) hours as needed for pain. 02/17/24  Yes [name], [name], NP  pantoprazole  (PROTONIX ) 40 MG tablet Take 1 tablet (40 mg total) by mouth 2 (two) times daily. Patient taking differently: Take 40 mg by mouth 2 (two) times daily as needed. 01/25/24  Yes Causey, Morna Pickle, NP   prochlorperazine  (COMPAZINE ) 10 MG tablet Take 1 tablet (10 mg total) by mouth every 6 (six) hours as needed for nausea or vomiting. 02/08/24  Yes [name], [name], MD  ciprofloxacin  (CIPRO ) 500 MG tablet Take 1 tablet (500 mg total) by mouth 2 (two) times daily. Patient not taking: Reported on 03/05/2024 02/22/24   [name], NP  dexamethasone  (DECADRON ) 4 MG tablet Take 2 tablets (8 mg) by mouth daily for 3 days starting the day after chemotherapy. Take with food. Patient not taking: No sig reported 02/08/24   [name], [name], MD  polyethylene glycol powder (GLYCOLAX /MIRALAX ) 17 GM/SCOOP powder Mix 17 g in 4 oz of liquid and take by mouth 2 (two) times daily as needed. Patient not taking: No sig reported 11/30/23   [name], NP  senna (SENOKOT) 8.6 MG TABS tablet Take 2 tablets (17.2 mg total) by mouth 2 (two) times daily. Patient not taking: No sig reported 01/31/24   [name], [name], NP  sulfamethoxazole -trimethoprim  (BACTRIM  DS) 800-160 MG tablet Take 1 tablet by mouth 2 (two) times daily. Patient not taking: Reported on 03/05/2024 02/22/24   [name], NP  thiamine  (VITAMIN B1) 100 MG tablet Take 1 tablet (100 mg total) by mouth daily. Patient not taking: Reported on 02/23/2024 02/07/24   Samtani, Jai-Gurmukh, MD    Physical Exam: Constitutional: Moderately built and nourished. Vitals:   03/05/24 0112 03/05/24 0400 03/05/24 0430 03/05/24 0453  BP: 114/75 119/74 108/74   Pulse: 64 70 68   Resp: 17 16 17    Temp: 97.6 F (36.4 C)   97.6 F (36.4 C)  TempSrc:    Oral  SpO2: 98% 99% 98%   Weight:      Height:       Eyes: Anicteric no pallor. ENMT: Discharge from the ears eyes nose and mouth. Neck: No mass felt.  No neck rigidity. Respiratory: No rhonchi or crepitations. Cardiovascular: S1-S2 heard. Abdomen: Soft nontender ostomy tube seen. Musculoskeletal: No edema. Skin: No rash. Neurologic: Alert awake oriented time place and person.   Moves all extremities. Psychiatric: Appears normal.  Normal affect.   Labs on Admission: I have personally reviewed following labs and imaging studies  CBC: Recent Labs  Lab 02/27/24 1531 02/27/24 1845 02/27/24 2027 03/04/24 2305 03/04/24 2316  WBC 2.7* 2.7*  --  3.2*  --   NEUTROABS 1.4* 1.2*  --  1.5*  --   HGB 6.9* 6.2* 5.4* 7.6* 7.5*  HCT 20.2* 19.9* 16.0* 23.7* 22.0*  MCV 75.1* 79.6*  --  80.6  --   PLT 108* 120*  --  138*  --    Basic Metabolic Panel: Recent Labs  Lab 02/27/24 1531 02/27/24 1845 02/27/24 2027 03/04/24 2305 03/04/24 2316  NA 136 136 136 137 136  K 3.3*  3.4* 3.3* 2.7* 2.7*  CL 101 101 101 96* 93*  CO2 26 22  --  28  --   GLUCOSE 112* 98 99 108* 110*  BUN 9 8 6 18 18   CREATININE 0.51 0.56 0.70 0.92 1.10*  CALCIUM  8.2* 8.5*  --  8.9  --   MG 1.4* 1.6*  --   --   --    GFR: Estimated Creatinine Clearance: 67.6 mL/min (A) (by C-G formula based on SCr of 1.1 mg/dL (H)). Liver Function Tests: Recent Labs  Lab 02/27/24 1531 02/27/24 1845 03/04/24 2305  AST 14* 19 47*  ALT 35 40 45*  ALKPHOS 809* 811* 1,109*  BILITOT 0.7 0.6 0.4  PROT 6.6 6.0* 6.1*  ALBUMIN  3.4* 3.1* 3.3*   No results for input(s): LIPASE, AMYLASE in the last 168 hours. No results for input(s): AMMONIA in the last 168 hours. Coagulation Profile: Recent Labs  Lab 02/27/24 1845  INR 1.2   Cardiac Enzymes: No results for input(s): CKTOTAL, CKMB, CKMBINDEX, TROPONINI in the last 168 hours. BNP (last 3 results) No results for input(s): PROBNP in the last 8760 hours. HbA1C: No results for input(s): HGBA1C in the last 72 hours. CBG: No results for input(s): GLUCAP in the last 168 hours. Lipid Profile: No results for input(s): CHOL, HDL, LDLCALC, TRIG, CHOLHDL, LDLDIRECT in the last 72 hours. Thyroid  Function Tests: No results for input(s): TSH, T4TOTAL, FREET4, T3FREE, THYROIDAB in the last 72 hours. Anemia Panel: No results for  input(s): VITAMINB12, FOLATE, FERRITIN, TIBC, IRON, RETICCTPCT in the last 72 hours. Urine analysis:    Component Value Date/Time   COLORURINE YELLOW 02/22/2024 1223   APPEARANCEUR CLEAR 02/22/2024 1223   LABSPEC 1.015 02/22/2024 1223   PHURINE 6.0 02/22/2024 1223   GLUCOSEU NEGATIVE 02/22/2024 1223   HGBUR NEGATIVE 02/22/2024 1223   BILIRUBINUR NEGATIVE 02/22/2024 1223   KETONESUR 5 (A) 02/22/2024 1223   PROTEINUR NEGATIVE 02/22/2024 1223   UROBILINOGEN 0.2 11/01/2013 1246   NITRITE NEGATIVE 02/22/2024 1223   LEUKOCYTESUR NEGATIVE 02/22/2024 1223   Sepsis Labs: @LABRCNTIP (procalcitonin:4,lacticidven:4) ) Recent Results (from the past 240 hours)  Blood Culture (routine x 2)     Status: None   Collection Time: 02/27/24  6:44 PM   Specimen: BLOOD  Result Value Ref Range Status   Specimen Description   Final    BLOOD SITE NOT SPECIFIED Performed at [facility], [address], [place], [place] [address]    Special Requests   Final    Blood Culture results may not be optimal due to an inadequate volume of blood received in culture bottles BOTTLES DRAWN AEROBIC AND ANAEROBIC Performed at [facility], [address], [place], [place] [address]    Culture   Final    NO GROWTH 5 DAYS Performed at [facility], [address] [place], [place] [address]    Report Status 03/03/2024 FINAL  Final  Blood Culture (routine x 2)     Status: None   Collection Time: 02/27/24  6:45 PM   Specimen: BLOOD  Result Value Ref Range Status   Specimen Description   Final    BLOOD PORTA CATH Performed at [facility], [address] [place], [place] [address]    Special Requests   Final    Blood Culture adequate volume BOTTLES DRAWN AEROBIC AND ANAEROBIC Performed at [facility], [address], [place], [place] [address]    Culture   Final  NO GROWTH 5 DAYS Performed at [facility], [address], [place], [place] [address]    Report Status 03/03/2024 FINAL  Final  Resp panel by RT-PCR (RSV, Flu A&B, Covid) Anterior Nasal Swab     Status: None   Collection Time: 02/27/24  7:20 PM   Specimen: Anterior Nasal Swab  Result Value Ref Range Status   SARS Coronavirus 2 by RT PCR NEGATIVE NEGATIVE Final    Comment: (NOTE) SARS-CoV-2 target nucleic acids are NOT DETECTED.  The SARS-CoV-2 RNA is generally detectable in upper respiratory specimens during the acute phase of infection. The lowest concentration of SARS-CoV-2 viral copies this assay can detect is 138 copies/mL. A negative result does not preclude SARS-Cov-2 infection and should not be used as the sole basis for treatment or other patient management decisions. A negative result may occur with  improper specimen collection/handling, submission of specimen other than nasopharyngeal swab, presence of viral mutation(s) within the areas targeted by this assay, and inadequate number of viral copies(<138 copies/mL). A negative result must be combined with clinical observations, patient history, and epidemiological information. The expected result is Negative.  Fact Sheet for Patients:  BloggerCourse.com  Fact Sheet for Healthcare Providers:  SeriousBroker.it  This test is no t yet approved or cleared by the [place]  FDA and  has been authorized for detection and/or diagnosis of SARS-CoV-2 by FDA under an Emergency Use Authorization (EUA). This EUA will remain  in effect (meaning this test can be used) for the duration of the COVID-19 declaration under Section 564(b)(1) of the Act, 21 U.S.C.section 360bbb-3(b)(1), unless the authorization is terminated  or revoked sooner.       Influenza A by PCR NEGATIVE NEGATIVE Final   Influenza B by PCR NEGATIVE NEGATIVE Final    Comment: (NOTE) The Xpert Xpress SARS-CoV-2/FLU/RSV plus assay is intended as an aid in  the diagnosis of influenza from Nasopharyngeal swab specimens and should not be used as a sole basis for treatment. Nasal washings and aspirates are unacceptable for Xpert Xpress SARS-CoV-2/FLU/RSV testing.  Fact Sheet for Patients: BloggerCourse.com  Fact Sheet for Healthcare Providers: SeriousBroker.it  This test is not yet approved or cleared by the [place]  FDA and has been authorized for detection and/or diagnosis of SARS-CoV-2 by FDA under an Emergency Use Authorization (EUA). This EUA will remain in effect (meaning this test can be used) for the duration of the COVID-19 declaration under Section 564(b)(1) of the Act, 21 U.S.C. section 360bbb-3(b)(1), unless the authorization is terminated or revoked.     Resp Syncytial Virus by PCR NEGATIVE NEGATIVE Final    Comment: (NOTE) Fact Sheet for Patients: BloggerCourse.com  Fact Sheet for Healthcare Providers: SeriousBroker.it  This test is not yet approved or cleared by the [place]  FDA and has been authorized for detection and/or diagnosis of SARS-CoV-2 by FDA under an Emergency Use Authorization (EUA). This EUA will remain in effect (meaning this test can be used) for the duration of the COVID-19 declaration under Section 564(b)(1) of the Act, 21 U.S.C. section 360bbb-3(b)(1), unless the authorization is terminated or revoked.  Performed at [facility], [address], [place], [place] [address]      Radiological Exams on Admission: CT ABDOMEN PELVIS W CONTRAST Result Date: 03/05/2024 EXAM: CT ABDOMEN AND PELVIS WITH CONTRAST 03/05/2024 01:00:32 AM TECHNIQUE: CT of the abdomen and pelvis was performed with the administration of intravenous contrast. Multiplanar reformatted images are provided for review. Automated exposure control, iterative reconstruction,  and/or weight-based adjustment of the  mA/kV was utilized to reduce the radiation dose to as low as reasonably achievable. COMPARISON: None available. CLINICAL HISTORY: Re-evaluation of previously diagnosed abdominal abscess of 02/27/24. FINDINGS: LOWER CHEST: No acute abnormality. LIVER: Focal fatty hepatic infiltration adjacent to the falciform ligament. No enhancing intrahepatic mass. GALLBLADDER AND BILE DUCTS: The gallbladder is unremarkable. There is progressive dilation of the extrahepatic bile duct which measures now 15 mm in diameter and demonstrates a relatively abrupt caliber change within the head of the pancreas best seen on coronal image 64/8. Known pancreatic head mass is not clearly identified on this examination. No intrahepatic biliary ductal dilation. SPLEEN: No acute abnormality. PANCREAS: No acute abnormality. ADRENAL GLANDS: No acute abnormality. KIDNEYS, URETERS AND BLADDER: Simple cortical cysts are seen within the kidneys bilaterally. The kidneys are otherwise unremarkable. No follow up imaging is recommended for these lesions. No stones in the kidneys or ureters. No hydronephrosis. No perinephric or periureteral stranding. Urinary bladder is unremarkable. GI AND BOWEL: Partial gastrectomy, gastrojejunostomy, and balloon retention gastrostomy placement are again identified. Moderate pancolonic diverticulosis. The stomach, small bowel, and large bowel are otherwise unremarkable. Appendix normal. PERITONEUM AND RETROPERITONEUM: There is omental infiltration in keeping with peritoneal carcinomatosis, unchanged from prior examination and best visualized within the periumbilical region. No ascites. No free air. VASCULATURE: Aorta is normal in caliber. LYMPH NODES: No lymphadenopathy. REPRODUCTIVE ORGANS: Uterus absent. No adnexal masses. BONES AND SOFT TISSUES: Diffuse mixed lytic and sclerotic patterns again seen within the axial skeleton in keeping with widespread osseous metastatic disease. Remote pathologic fracture of L3 with mild  loss of height again noted. No retropulsion or listhesis. No acute bone abnormality. The previously identified complex left upper quadrant abdominal wall abscess has improved with a residual 1.4 x 1.8 cm fluid collection seen subjacent to the umbilical abdominal wall within the peritoneum and a pancreatic collection of gas and debris between the internal and external oblique musculature measuring 4 mm x 22 mm communicating with the previous catheter tract. There is persistent abdominal wall thickening and interfascial infiltration in keeping with inflammatory change. No new loculated fluid collections are identified. IMPRESSION: 1. Improved left upper quadrant abdominal wall abscess with residual 1.4 x 1.8 cm fluid collection and persistent inflammatory change. No new loculated fluid collections. 2. Progressive dilation of the extrahepatic bile duct to 15 mm with abrupt caliber change within the head of the pancreas. Known pancreatic head mass is not clearly identified. No intrahepatic biliary ductal dilation. 3. Omental infiltration consistent with peritoneal carcinomatosis, unchanged from prior examination. 4. Diffuse mixed lytic and sclerotic patterns within the axial skeleton consistent with widespread osseous metastatic disease. No acute bone abnormality. Electronically signed by: Dorethia Molt MD 03/05/2024 01:32 AM EDT RP Workstation: [id number]    EKG: Independently reviewed.  Normal sinus rhythm prolonged QT.  Assessment/Plan Principal Problem:   Abdominal wall abscess Active Problems:   Hypokalemia   Malignant neoplasm of upper-outer quadrant of right breast in female, estrogen receptor positive (HCC)   Pancytopenia (HCC)   Essential hypertension    Abdominal wall abscess with inflammation for which general surgery has been consulted.  Will keep patient empiric antibiotics and awaiting further recommendation from general surgery. Severe hypokalemia likely from nausea vomiting unable to  tolerate oral feeds.  Replace potassium and gently hydrate.  Recheck. Pancytopenia likely related to patient's cancer therapy.  If there is any further decrease in hemoglobin patient will need transfusion. Breast cancer with metastasis patient states she is due for  chemotherapy next week.  Patient has known history of breast cancer with osseous metastasis pancreatic lesion and peritoneal carcinomatosis follows with Dr. Godina. Chronic pain as needed IV Dilaudid  for now.  Since patient has abdominal wall cellulitis/abscess will need further management and more than 2 midnight stay.   DVT prophylaxis: SCDs.  Avoiding pharmacological DVT prophylaxis in anticipation of procedure. Code Status: Full code. Family Communication: Discussed with patient. Disposition Plan: Medical floor. Consults called: General Surgery. Admission status: Patient.

## 2024-03-06 ENCOUNTER — Other Ambulatory Visit: Payer: Self-pay | Admitting: Nurse Practitioner

## 2024-03-06 DIAGNOSIS — R112 Nausea with vomiting, unspecified: Secondary | ICD-10-CM

## 2024-03-06 DIAGNOSIS — Z515 Encounter for palliative care: Secondary | ICD-10-CM | POA: Diagnosis not present

## 2024-03-06 DIAGNOSIS — L02211 Cutaneous abscess of abdominal wall: Secondary | ICD-10-CM | POA: Diagnosis not present

## 2024-03-06 DIAGNOSIS — R748 Abnormal levels of other serum enzymes: Secondary | ICD-10-CM | POA: Diagnosis not present

## 2024-03-06 DIAGNOSIS — R110 Nausea: Secondary | ICD-10-CM

## 2024-03-06 DIAGNOSIS — D649 Anemia, unspecified: Secondary | ICD-10-CM

## 2024-03-06 DIAGNOSIS — G893 Neoplasm related pain (acute) (chronic): Secondary | ICD-10-CM | POA: Diagnosis not present

## 2024-03-06 DIAGNOSIS — Z7189 Other specified counseling: Secondary | ICD-10-CM

## 2024-03-06 DIAGNOSIS — K315 Obstruction of duodenum: Secondary | ICD-10-CM

## 2024-03-06 DIAGNOSIS — K838 Other specified diseases of biliary tract: Secondary | ICD-10-CM

## 2024-03-06 LAB — COMPREHENSIVE METABOLIC PANEL WITH GFR
ALT: 52 U/L — ABNORMAL HIGH (ref 0–44)
AST: 70 U/L — ABNORMAL HIGH (ref 15–41)
Albumin: 3 g/dL — ABNORMAL LOW (ref 3.5–5.0)
Alkaline Phosphatase: 1138 U/L — ABNORMAL HIGH (ref 38–126)
Anion gap: 12 (ref 5–15)
BUN: 10 mg/dL (ref 6–20)
CO2: 26 mmol/L (ref 22–32)
Calcium: 8.8 mg/dL — ABNORMAL LOW (ref 8.9–10.3)
Chloride: 104 mmol/L (ref 98–111)
Creatinine, Ser: 0.86 mg/dL (ref 0.44–1.00)
GFR, Estimated: >60 mL/min (ref >60)
Glucose, Bld: 114 mg/dL — ABNORMAL HIGH (ref 70–99)
Potassium: 4.1 mmol/L (ref 3.5–5.1)
Sodium: 141 mmol/L (ref 135–145)
Total Bilirubin: 0.3 mg/dL (ref 0.0–1.2)
Total Protein: 5.6 g/dL — ABNORMAL LOW (ref 6.5–8.1)

## 2024-03-06 LAB — CBC WITH DIFFERENTIAL/PLATELET
Abs Immature Granulocytes: 0.27 K/uL — ABNORMAL HIGH (ref 0.00–0.07)
Basophils Absolute: 0 K/uL (ref 0.0–0.1)
Basophils Relative: 1 %
Eosinophils Absolute: 0 K/uL (ref 0.0–0.5)
Eosinophils Relative: 1 %
HCT: 23.1 % — ABNORMAL LOW (ref 36.0–46.0)
Hemoglobin: 7.2 g/dL — ABNORMAL LOW (ref 12.0–15.0)
Immature Granulocytes: 8 %
Lymphocytes Relative: 36 %
Lymphs Abs: 1.2 K/uL (ref 0.7–4.0)
MCH: 25.5 pg — ABNORMAL LOW (ref 26.0–34.0)
MCHC: 31.2 g/dL (ref 30.0–36.0)
MCV: 81.9 fL (ref 80.0–100.0)
Monocytes Absolute: 0.3 K/uL (ref 0.1–1.0)
Monocytes Relative: 9 %
Neutro Abs: 1.6 K/uL — ABNORMAL LOW (ref 1.7–7.7)
Neutrophils Relative %: 45 %
Platelets: 124 K/uL — ABNORMAL LOW (ref 150–400)
RBC: 2.82 MIL/uL — ABNORMAL LOW (ref 3.87–5.11)
RDW: 19.3 % — ABNORMAL HIGH (ref 11.5–15.5)
Smear Review: NORMAL
WBC: 3.4 K/uL — ABNORMAL LOW (ref 4.0–10.5)
nRBC: 0 % (ref 0.0–0.2)

## 2024-03-06 LAB — PHOSPHORUS: Phosphorus: 3.6 mg/dL (ref 2.5–4.6)

## 2024-03-06 LAB — MAGNESIUM: Magnesium: 2 mg/dL (ref 1.7–2.4)

## 2024-03-06 MED ORDER — LORAZEPAM 1 MG PO TABS: 1.0000 mg | ORAL_TABLET | Freq: Once | ORAL | Status: DC

## 2024-03-06 MED ORDER — LORAZEPAM 1 MG PO TABS: 1.0000 mg | ORAL_TABLET | Freq: Once | ORAL | Status: DC | PRN

## 2024-03-06 MED ORDER — FENTANYL 12 MCG/HR TD PT72
1.0000 | MEDICATED_PATCH | TRANSDERMAL | Status: DC
  Administered 2024-03-06: 1 via TRANSDERMAL
  Filled 2024-03-06: qty 1

## 2024-03-06 MED FILL — Fosaprepitant Dimeglumine For IV Infusion 150 MG (Base Eq): INTRAVENOUS | Qty: 5 | Status: AC

## 2024-03-06 NOTE — Progress Notes (Signed)
 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  She was recently admitted with low hemoglobin and found to have abdominal fluid collection concerning for abscess however she left AMA and went home.  Came back to the emergency room on surgeons and oncologist advised.  Patient has persistent pain in the epigastric area and G-tube area along with nausea and vomiting.  At the emergency room hemodynamically stable.  Hemoglobin 7.5.  Potassium 2.7.  CT scan consistent with abdominal wall abscess.  Admitted with IV antibiotics.  Surgery consulted.  Subjective: Patient seen and examined.  Care discussion with surgery, oncology, palliative.  Patient herself is trying to eat and she thinks she can eat.  Complained of episodic epigastric pain and more drainage into the Ekins patch. Chemotherapy is canceled.  She is comfortable with plan to stay in the hospital for symptom control before going home.  Case discussed with gastroenterology, she might have exhausted procedures.  May be appropriate candidate for palliative care discussions.  Oncology following.  Assessment & Plan:   Abdominal wall abscess with inflammation, presence of indwelling J-tube.  Persistent drainage from the previous G-tube site. Will continue IV antibiotics today.  Surgery following, conservative management. Once her symptoms are controlled, we can likely do a prolonged course of oral antibiotics. Advance to regular diet.  Adequate pain medications.  Nausea medications.  Severe hypokalemia: Due to inadequate oral intake.  Potassium replaced.  Adequate today.  Breast cancer with  metastasis: Pancreatic lesion with peritoneal carcinomatosis.  She is scheduled to start on chemotherapy this week.  Hopefully her infection gets controlled and she can start chemotherapy. Challenging treatment and outcome.  Oncology and palliative following.  Anemia of chronic disease: Repeat hemoglobin pending.  Will transfuse if less than 7.     DVT prophylaxis: SCDs Start: 03/05/24 9380   Code Status: Full code Family Communication: None at the bedside Disposition Plan: Status is: Inpatient Remains inpatient appropriate because: Significant symptoms, antibiotics and IV pain medications     Consultants:  General Surgery  Procedures:  None  Antimicrobials:  Zosyn  9/14     Objective: Vitals:   03/05/24 0700 03/05/24 1212 03/05/24 2122 03/06/24 0455  BP: 94/60 96/63 97/64  97/68  Pulse: 60 65 77 82  Resp: 18 18 16 16   Temp: 97.8 F (36.6 C) (!) 97.5 F (36.4 C) 98.3 F (36.8 C) 97.9 F (36.6 C)  TempSrc: Oral Oral Oral Oral  SpO2: 99% 96% 97% 100%  Weight:      Height:        Intake/Output Summary (Last 24 hours) at 03/06/2024 1138 Last data filed at 03/06/2024 1015 Gross per 24 hour  Intake 2568.11 ml  Output --  Net 2568.11 ml   Filed Weights   03/04/24 2053  Weight: 72.6 kg    Examination:  General exam: Appears calm and comfortable.  On room air. Respiratory system: Clear to auscultation. Respiratory effort normal. Cardiovascular system: S1 & S2 heard, RRR. No JVD, murmurs, rubs, gallops or clicks. No pedal edema. Gastrointestinal system: Abdomen soft.  Mild tenderness along the epigastrium and left lower quadrant. She has a midline J-tube with some drainage around it.  Not used. Patient  has pouch fitted on the left lateral quadrant with minimal cloudy drainage. Central nervous system: Alert and oriented. No focal neurological deficits. Extremities: Symmetric 5 x 5 power. Skin: No rashes, lesions or ulcers Psychiatry: Judgement and insight appear  normal. Mood & affect appropriate.     Data Reviewed: I have personally reviewed following labs and imaging studies  CBC: Recent Labs  Lab 03/04/24 2305 03/04/24 2316 03/05/24 1210  WBC 3.2*  --  2.9*  NEUTROABS 1.5*  --  1.4*  HGB 7.6* 7.5* 7.0*  HCT 23.7* 22.0* 22.4*  MCV 80.6  --  81.5  PLT 138*  --  138*   Basic Metabolic Panel: Recent Labs  Lab 03/04/24 2305 03/04/24 2316 03/05/24 1210 03/06/24 1000  NA 137 136 137 141  K 2.7* 2.7* 3.8 4.1  CL 96* 93* 98 104  CO2 28  --  26 26  GLUCOSE 108* 110* 100* 114*  BUN 18 18 15 10   CREATININE 0.92 1.10* 0.72 0.86  CALCIUM  8.9  --  8.9 8.8*  MG  --   --  2.1 2.0  PHOS  --   --   --  3.6   GFR: Estimated Creatinine Clearance: 86.5 mL/min (by C-G formula based on SCr of 0.86 mg/dL). Liver Function Tests: Recent Labs  Lab 03/04/24 2305 03/06/24 1000  AST 47* 70*  ALT 45* 52*  ALKPHOS 1,109* 1,138*  BILITOT 0.4 0.3  PROT 6.1* 5.6*  ALBUMIN  3.3* 3.0*   No results for input(s): LIPASE, AMYLASE in the last 168 hours. No results for input(s): AMMONIA in the last 168 hours. Coagulation Profile: No results for input(s): INR, PROTIME in the last 168 hours.  Cardiac Enzymes: No results for input(s): CKTOTAL, CKMB, CKMBINDEX, TROPONINI in the last 168 hours. BNP (last 3 results) No results for input(s): PROBNP in the last 8760 hours. HbA1C: No results for input(s): HGBA1C in the last 72 hours. CBG: No results for input(s): GLUCAP in the last 168 hours. Lipid Profile: No results for input(s): CHOL, HDL, LDLCALC, TRIG, CHOLHDL, LDLDIRECT in the last 72 hours. Thyroid  Function Tests: No results for input(s): TSH, T4TOTAL, FREET4, T3FREE, THYROIDAB in the last 72 hours. Anemia Panel: No results for input(s): VITAMINB12, FOLATE, FERRITIN, TIBC, IRON, RETICCTPCT in the last 72 hours. Sepsis Labs: Recent Labs  Lab 03/04/24 2317 03/05/24 0138  LATICACIDVEN 1.1  0.8    Recent Results (from the past 240 hours)  Blood Culture (routine x 2)     Status: None   Collection Time: 02/27/24  6:44 PM   Specimen: BLOOD  Result Value Ref Range Status   Specimen Description   Final    BLOOD SITE NOT SPECIFIED Performed at [facility], [address] [place], [place] [address]    Special Requests   Final    Blood Culture results may not be optimal due to an inadequate volume of blood received in culture bottles BOTTLES DRAWN AEROBIC AND ANAEROBIC Performed at [facility], [address] [place], [place] [address]    Culture   Final    NO GROWTH 5 DAYS Performed at [facility], [address] [place], [place] [address]    Report Status 03/03/2024 FINAL  Final  Blood Culture (routine x 2)     Status: None   Collection Time: 02/27/24  6:45 PM   Specimen: BLOOD  Result Value Ref Range Status   Specimen Description   Final    BLOOD PORTA CATH  Performed at [facility], [address], [place], [place] [address]    Special Requests   Final    Blood Culture adequate volume BOTTLES DRAWN AEROBIC AND ANAEROBIC Performed at [facility], [address], [place], [place] [address]    Culture   Final    NO GROWTH 5 DAYS Performed at [facility], [address] [place], [place] [address]    Report Status 03/03/2024 FINAL  Final  Resp panel by RT-PCR (RSV, Flu A&B, Covid) Anterior Nasal Swab     Status: None   Collection Time: 02/27/24  7:20 PM   Specimen: Anterior Nasal Swab  Result Value Ref Range Status   SARS Coronavirus 2 by RT PCR NEGATIVE NEGATIVE Final    Comment: (NOTE) SARS-CoV-2 target nucleic acids are NOT DETECTED.  The SARS-CoV-2 RNA is generally detectable in upper respiratory specimens during the acute phase of infection. The lowest concentration of SARS-CoV-2 viral copies this assay can detect is 138 copies/mL. A negative result does not  preclude SARS-Cov-2 infection and should not be used as the sole basis for treatment or other patient management decisions. A negative result may occur with  improper specimen collection/handling, submission of specimen other than nasopharyngeal swab, presence of viral mutation(s) within the areas targeted by this assay, and inadequate number of viral copies(<138 copies/mL). A negative result must be combined with clinical observations, patient history, and epidemiological information. The expected result is Negative.  Fact Sheet for Patients:  BloggerCourse.com  Fact Sheet for Healthcare Providers:  SeriousBroker.it  This test is no t yet approved or cleared by the [place]  FDA and  has been authorized for detection and/or diagnosis of SARS-CoV-2 by FDA under an Emergency Use Authorization (EUA). This EUA will remain  in effect (meaning this test can be used) for the duration of the COVID-19 declaration under Section 564(b)(1) of the Act, 21 U.S.C.section 360bbb-3(b)(1), unless the authorization is terminated  or revoked sooner.       Influenza A by PCR NEGATIVE NEGATIVE Final   Influenza B by PCR NEGATIVE NEGATIVE Final    Comment: (NOTE) The Xpert Xpress SARS-CoV-2/FLU/RSV plus assay is intended as an aid in the diagnosis of influenza from Nasopharyngeal swab specimens and should not be used as a sole basis for treatment. Nasal washings and aspirates are unacceptable for Xpert Xpress SARS-CoV-2/FLU/RSV testing.  Fact Sheet for Patients: BloggerCourse.com  Fact Sheet for Healthcare Providers: SeriousBroker.it  This test is not yet approved or cleared by the [place]  FDA and has been authorized for detection and/or diagnosis of SARS-CoV-2 by FDA under an Emergency Use Authorization (EUA). This EUA will remain in effect (meaning this test can be used) for the  duration of the COVID-19 declaration under Section 564(b)(1) of the Act, 21 U.S.C. section 360bbb-3(b)(1), unless the authorization is terminated or revoked.     Resp Syncytial Virus by PCR NEGATIVE NEGATIVE Final    Comment: (NOTE) Fact Sheet for Patients: BloggerCourse.com  Fact Sheet for Healthcare Providers: SeriousBroker.it  This test is not yet approved or cleared by the [place]  FDA and has been authorized for detection and/or diagnosis of SARS-CoV-2 by FDA under an Emergency Use Authorization (EUA). This EUA will remain in effect (meaning this test can be used) for the duration of the COVID-19 declaration under Section 564(b)(1) of the Act, 21 U.S.C. section 360bbb-3(b)(1), unless the authorization is terminated or revoked.  Performed at [facility], [address] [place],  [place] [address]          Radiology Studies: CT ABDOMEN PELVIS W CONTRAST Result Date: 03/05/2024 EXAM: CT ABDOMEN AND PELVIS WITH CONTRAST 03/05/2024 01:00:32 AM TECHNIQUE: CT of the abdomen and pelvis was performed with the administration of intravenous contrast. Multiplanar reformatted images are provided for review. Automated exposure control, iterative reconstruction, and/or weight-based adjustment of the mA/kV was utilized to reduce the radiation dose to as low as reasonably achievable. COMPARISON: None available. CLINICAL HISTORY: Re-evaluation of previously diagnosed abdominal abscess of 02/27/24. FINDINGS: LOWER CHEST: No acute abnormality. LIVER: Focal fatty hepatic infiltration adjacent to the falciform ligament. No enhancing intrahepatic mass. GALLBLADDER AND BILE DUCTS: The gallbladder is unremarkable. There is progressive dilation of the extrahepatic bile duct which measures now 15 mm in diameter and demonstrates a relatively abrupt caliber change within the head of the pancreas best seen on coronal image 64/8. Known  pancreatic head mass is not clearly identified on this examination. No intrahepatic biliary ductal dilation. SPLEEN: No acute abnormality. PANCREAS: No acute abnormality. ADRENAL GLANDS: No acute abnormality. KIDNEYS, URETERS AND BLADDER: Simple cortical cysts are seen within the kidneys bilaterally. The kidneys are otherwise unremarkable. No follow up imaging is recommended for these lesions. No stones in the kidneys or ureters. No hydronephrosis. No perinephric or periureteral stranding. Urinary bladder is unremarkable. GI AND BOWEL: Partial gastrectomy, gastrojejunostomy, and balloon retention gastrostomy placement are again identified. Moderate pancolonic diverticulosis. The stomach, small bowel, and large bowel are otherwise unremarkable. Appendix normal. PERITONEUM AND RETROPERITONEUM: There is omental infiltration in keeping with peritoneal carcinomatosis, unchanged from prior examination and best visualized within the periumbilical region. No ascites. No free air. VASCULATURE: Aorta is normal in caliber. LYMPH NODES: No lymphadenopathy. REPRODUCTIVE ORGANS: Uterus absent. No adnexal masses. BONES AND SOFT TISSUES: Diffuse mixed lytic and sclerotic patterns again seen within the axial skeleton in keeping with widespread osseous metastatic disease. Remote pathologic fracture of L3 with mild loss of height again noted. No retropulsion or listhesis. No acute bone abnormality. The previously identified complex left upper quadrant abdominal wall abscess has improved with a residual 1.4 x 1.8 cm fluid collection seen subjacent to the umbilical abdominal wall within the peritoneum and a pancreatic collection of gas and debris between the internal and external oblique musculature measuring 4 mm x 22 mm communicating with the previous catheter tract. There is persistent abdominal wall thickening and interfascial infiltration in keeping with inflammatory change. No new loculated fluid collections are identified.  IMPRESSION: 1. Improved left upper quadrant abdominal wall abscess with residual 1.4 x 1.8 cm fluid collection and persistent inflammatory change. No new loculated fluid collections. 2. Progressive dilation of the extrahepatic bile duct to 15 mm with abrupt caliber change within the head of the pancreas. Known pancreatic head mass is not clearly identified. No intrahepatic biliary ductal dilation. 3. Omental infiltration consistent with peritoneal carcinomatosis, unchanged from prior examination. 4. Diffuse mixed lytic and sclerotic patterns within the axial skeleton consistent with widespread osseous metastatic disease. No acute bone abnormality. Electronically signed by: Dorethia Molt MD 03/05/2024 01:32 AM EDT RP Workstation: [id number]        Scheduled Meds:  Chlorhexidine  Gluconate Cloth  6 each Topical Daily   feeding supplement  1 Container Oral TID BM   sodium chloride  flush  10-40 mL Intracatheter Q12H   Continuous Infusions:  piperacillin -tazobactam (ZOSYN )  IV 3.375 g (03/06/24 0736)   promethazine  (PHENERGAN ) injection (IM or IVPB) Stopped (03/05/24 1453)     LOS: 1 day  Time spent: 35 minutes.    Renato Applebaum, MD [facility]

## 2024-03-06 NOTE — TOC Progression Note (Signed)
 Transition of Care [facility]) - Progression Note    Patient Details  Name: Chelsea Hansen MRN: [id number] Date of Birth: 06-30-1970  [facility] CM/SW Contact  Doneta Glenys DASEN, RN Phone Number: 03/06/2024, 10:59 AM  Clinical Narrative:    CM spoke with patient and Curtistine son [facility] POA) on speaker. Discussed the order to call in case of emergency. [facility] POA paper work printed and placed on chart.   Expected Discharge Plan: Home/Self Care Barriers to Discharge: Continued Medical Work up               Expected Discharge Plan and Services In-house Referral: NA Discharge Planning Services: CM Consult Post Acute Care Choice: NA Living arrangements for the past 2 months: Apartment                 DME Arranged: N/A DME Agency: NA       HH Arranged: NA HH Agency: NA         Social Drivers of Health (SDOH) Interventions SDOH Screenings   Food Insecurity: No Food Insecurity (03/05/2024)  Recent Concern: Food Insecurity - Food Insecurity Present (02/16/2024)  Housing: Low Risk  (03/05/2024)  Transportation Needs: No Transportation Needs (03/05/2024)  Utilities: Not At Risk (03/05/2024)  Depression (PHQ2-9): Low Risk  (02/25/2024)  Recent Concern: Depression (PHQ2-9) - Medium Risk (01/25/2024)  Financial Resource Strain: High Risk (02/13/2024)  Physical Activity: Inactive (02/13/2024)  Social Connections: Moderately Isolated (02/13/2024)  Stress: No Stress Concern Present (02/13/2024)  Tobacco Use: Low Risk  (03/05/2024)  Recent Concern: Tobacco Use - High Risk (01/27/2024)   Received from [facility]  Health Literacy: Adequate Health Literacy (02/13/2024)    Readmission Risk Interventions    03/05/2024    4:03 PM 12/07/2023   12:21 PM 11/26/2023    2:58 PM  Readmission Risk Prevention Plan  Transportation Screening Complete Complete Complete  PCP or Specialist Appt within 3-5 Days   Complete  HRI or Home Care Consult   Complete  Social Work Consult for  Recovery Care Planning/Counseling   Complete  Palliative Care Screening   Not Applicable  Medication Review Oceanographer) Complete Complete Complete  PCP or Specialist appointment within 3-5 days of discharge Complete Complete   HRI or Home Care Consult Complete Complete   SW Recovery Care/Counseling Consult Complete Complete   Palliative Care Screening Not Applicable Not Applicable   Skilled Nursing Facility Not Applicable Not Applicable

## 2024-03-06 NOTE — Progress Notes (Addendum)
 [facility] Progress Note     Subjective: CC:  Pain controlled. She is about to eat a soft breakfast.  Reports at home she was emptying her eakin pouch 3 times daily, but over the weekend it increased to emptying it more frequently - sometimes its yellow, sometimes it is green. Says when she drinks fruit punch the drainage is red.   Objective: Vital signs in last 24 hours: Temp:  [97.5 F (36.4 C)-98.3 F (36.8 C)] 97.9 F (36.6 C) (09/16 0455) Pulse Rate:  [65-82] 82 (09/16 0455) Resp:  [16-18] 16 (09/16 0455) BP: (96-97)/(63-68) 97/68 (09/16 0455) SpO2:  [96 %-100 %] 100 % (09/16 0455) Last BM Date :  (PTA)  Intake/Output from previous day: 09/15 0701 - 09/16 0700 In: 2218.1 [P.O.:240; I.V.:1179.7; IV Piggyback:798.4] Out: -  Intake/Output this shift: No intake/output data recorded.  PE: Gen:  Alert, NAD, pleasant Card:  Regular rate and rhythm Pulm:  Normal effor Abd: Soft, non-tender, non-distended  G tube c/d/I, clamped   J tube site with eakins pouch with ~ 30mL yellow drainage  No abdominal wall cellulitis or warmth. Skin: warm and dry, no rashes  Psych: A&Ox3   Lab Results:  Recent Labs    03/04/24 2305 03/04/24 2316 03/05/24 1210  WBC 3.2*  --  2.9*  HGB 7.6* 7.5* 7.0*  HCT 23.7* 22.0* 22.4*  PLT 138*  --  138*   BMET Recent Labs    03/04/24 2305 03/04/24 2316 03/05/24 1210  NA 137 136 137  K 2.7* 2.7* 3.8  CL 96* 93* 98  CO2 28  --  26  GLUCOSE 108* 110* 100*  BUN 18 18 15   CREATININE 0.92 1.10* 0.72  CALCIUM  8.9  --  8.9   PT/INR No results for input(s): LABPROT, INR in the last 72 hours. CMP     Component Value Date/Time   NA 137 03/05/2024 1210   NA 144 11/30/2023 1000   K 3.8 03/05/2024 1210   CL 98 03/05/2024 1210   CO2 26 03/05/2024 1210   GLUCOSE 100 (H) 03/05/2024 1210   BUN 15 03/05/2024 1210   BUN 19 11/30/2023 1000   CREATININE 0.72 03/05/2024 1210   CREATININE 0.51 02/27/2024 1531   CALCIUM  8.9  03/05/2024 1210   PROT 6.1 (L) 03/04/2024 2305   PROT 7.3 11/30/2023 1000   ALBUMIN  3.3 (L) 03/04/2024 2305   ALBUMIN  4.5 11/30/2023 1000   AST 47 (H) 03/04/2024 2305   AST 14 (L) 02/27/2024 1531   ALT 45 (H) 03/04/2024 2305   ALT 35 02/27/2024 1531   ALKPHOS 1,109 (H) 03/04/2024 2305   BILITOT 0.4 03/04/2024 2305   BILITOT 0.7 02/27/2024 1531   GFRNONAA >60 03/05/2024 1210   GFRNONAA >60 02/27/2024 1531   GFRAA >60 09/07/2019 1340   Lipase     Component Value Date/Time   LIPASE 133 (H) 01/19/2024 1858       Studies/Results: CT ABDOMEN PELVIS W CONTRAST Result Date: 03/05/2024 EXAM: CT ABDOMEN AND PELVIS WITH CONTRAST 03/05/2024 01:00:32 AM TECHNIQUE: CT of the abdomen and pelvis was performed with the administration of intravenous contrast. Multiplanar reformatted images are provided for review. Automated exposure control, iterative reconstruction, and/or weight-based adjustment of the mA/kV was utilized to reduce the radiation dose to as low as reasonably achievable. COMPARISON: None available. CLINICAL HISTORY: Re-evaluation of previously diagnosed abdominal abscess of 02/27/24. FINDINGS: LOWER CHEST: No acute abnormality. LIVER: Focal fatty hepatic infiltration adjacent to the falciform ligament. No enhancing intrahepatic  mass. GALLBLADDER AND BILE DUCTS: The gallbladder is unremarkable. There is progressive dilation of the extrahepatic bile duct which measures now 15 mm in diameter and demonstrates a relatively abrupt caliber change within the head of the pancreas best seen on coronal image 64/8. Known pancreatic head mass is not clearly identified on this examination. No intrahepatic biliary ductal dilation. SPLEEN: No acute abnormality. PANCREAS: No acute abnormality. ADRENAL GLANDS: No acute abnormality. KIDNEYS, URETERS AND BLADDER: Simple cortical cysts are seen within the kidneys bilaterally. The kidneys are otherwise unremarkable. No follow up imaging is recommended for these  lesions. No stones in the kidneys or ureters. No hydronephrosis. No perinephric or periureteral stranding. Urinary bladder is unremarkable. GI AND BOWEL: Partial gastrectomy, gastrojejunostomy, and balloon retention gastrostomy placement are again identified. Moderate pancolonic diverticulosis. The stomach, small bowel, and large bowel are otherwise unremarkable. Appendix normal. PERITONEUM AND RETROPERITONEUM: There is omental infiltration in keeping with peritoneal carcinomatosis, unchanged from prior examination and best visualized within the periumbilical region. No ascites. No free air. VASCULATURE: Aorta is normal in caliber. LYMPH NODES: No lymphadenopathy. REPRODUCTIVE ORGANS: Uterus absent. No adnexal masses. BONES AND SOFT TISSUES: Diffuse mixed lytic and sclerotic patterns again seen within the axial skeleton in keeping with widespread osseous metastatic disease. Remote pathologic fracture of L3 with mild loss of height again noted. No retropulsion or listhesis. No acute bone abnormality. The previously identified complex left upper quadrant abdominal wall abscess has improved with a residual 1.4 x 1.8 cm fluid collection seen subjacent to the umbilical abdominal wall within the peritoneum and a pancreatic collection of gas and debris between the internal and external oblique musculature measuring 4 mm x 22 mm communicating with the previous catheter tract. There is persistent abdominal wall thickening and interfascial infiltration in keeping with inflammatory change. No new loculated fluid collections are identified. IMPRESSION: 1. Improved left upper quadrant abdominal wall abscess with residual 1.4 x 1.8 cm fluid collection and persistent inflammatory change. No new loculated fluid collections. 2. Progressive dilation of the extrahepatic bile duct to 15 mm with abrupt caliber change within the head of the pancreas. Known pancreatic head mass is not clearly identified. No intrahepatic biliary ductal  dilation. 3. Omental infiltration consistent with peritoneal carcinomatosis, unchanged from prior examination. 4. Diffuse mixed lytic and sclerotic patterns within the axial skeleton consistent with widespread osseous metastatic disease. No acute bone abnormality. Electronically signed by: Dorethia Molt MD 03/05/2024 01:32 AM EDT RP Workstation: [id number]    Anti-infectives: Anti-infectives (From admission, onward)    Start     Dose/Rate Route Frequency Ordered Stop   03/05/24 0800  piperacillin -tazobactam (ZOSYN ) IVPB 3.375 g        3.375 g 12.5 mL/hr over 240 Minutes Intravenous Every 8 hours 03/05/24 0710     03/05/24 0245  cefTRIAXone  (ROCEPHIN ) 1 g in sodium chloride  0.9 % 100 mL IVPB  Status:  Discontinued        1 g 200 mL/hr over 30 Minutes Intravenous  Once 03/05/24 0237 03/05/24 0241   03/05/24 0245  metroNIDAZOLE  (FLAGYL ) IVPB 500 mg        500 mg 100 mL/hr over 60 Minutes Intravenous  Once 03/05/24 0237 03/05/24 0554   03/05/24 0245  cefTRIAXone  (ROCEPHIN ) 2 g in sodium chloride  0.9 % 100 mL IVPB        2 g 200 mL/hr over 30 Minutes Intravenous  Once 03/05/24 0242 03/05/24 0452        Assessment/Plan  Chelsea Hansen is a 52 year  old lady with a history of metastatic breast cancer and 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 Lyndel and subsequently underwent G tube and J tube placement 01/10/24 when GJ was not draining who was readmitted 8/14 for FTT and G and J tube malfunction/malposition. G tube replaced by IR 8/15. J tube ultimately removed due to obstruction. Eakins pouch in place, low output. She now presents with vomiting and LUQ intra-abdominal/abdominal wall collection.  - CT on admission with LUQ abdominal/abdominal wall collection 1.4x 1.8 cm (Smaller compared to prior imaging) near J tube site. There is no external evidence of infection/cellulitis. No large intra-abdominal fluid collections to suggests  leak from jejunum from history of J tube. No systemic evidence of sepsis. Agree with continuing to treat this area like a fistula from jejunum to skin using an eakins pouch. 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 If it is high output we will have to consider backing off her diet and starting TPN.  -  Agree with IV abx for now for small fluid collection on CT.  - CT on admission w/ progressive peritoneal carcinomatosis( Peritoneal bx from 8/15 was consistent with metastatic carcinoma, breast primary) - scheduled to start chemo this week for known metastatic breast CA, per chart review; Dr. Odean; oncology has been notified of her admission and plans to see her, likely delaying chemo another week.  - increasing biliary dilation on CT, repeat CMP in AM; bilirubin normal. Alk phos elevated. No jaundice.  Discussed with medical service, calling GI consult for possible biliary obstruction at level of pancreatic lesion. - last palliative consult that I see is 8/18 - Full code/ Full scope, consider re-engaging palliative.   FEN - was advanced to soft diet; monitor  VTE - SCDs, sqh ID - None currently.    LOS: 1 day   I reviewed nursing notes, hospitalist notes, last 24 h vitals and pain scores, last 48 h intake and output, last 24 h labs and trends, and last 24 h imaging results.  This care required moderate level of medical decision making.   Almarie Pringle, PA-C [facility] Please see Amion for pager number during day hours 7:00am-4:30pm

## 2024-03-06 NOTE — Consult Note (Signed)
 Palliative Care Consult Note                                  Date: 03/06/2024   Patient Name: Chelsea Hansen  DOB: 1970/11/21  MRN: [id number]  Age / Sex: 53 y.o., female  PCP: [name], NP Referring Physician: Raenelle Coria, MD  Reason for Consultation: {Reason for Consult:23484}  HPI/Patient Profile: Palliative Care consult requested for goals of care discussion in this 53 y.o. female  with past medical history of *** admitted on 03/04/2024 with ***.   Past Medical History:  Diagnosis Date   Allergy    seasonal allergies   Anxiety    on meds   Asthma    uses inhaler   Breast cancer (HCC) 2012   RIGHT lumpectomy   Cancer (HCC) 2022   RIGHT breast lump-dx 2022   Depression    on meds   DVT (deep venous thrombosis) (HCC) 2010   after hysterectomy   Family history of uterine cancer    GERD (gastroesophageal reflux disease)    with certain foods/OTC PRN meds   Headache(784.0)    History of radiation therapy    Bilateral breast- 09/03/21-11/02/21- Dr. Lynwood Nasuti   Hyperlipidemia    on meds   Hypertension    on meds   SVT (supraventricular tachycardia) (HCC)      Subjective:   This NP Levon Freud reviewed medical records, received report from team, assessed the patient and then met at the patient's bedside with *** to discuss diagnosis, prognosis, GOC, EOL wishes disposition and options.   Concept of Palliative Care was introduced as specialized medical care for people and their families living with serious illness.  It focuses on providing relief from the symptoms and stress of a serious illness.  The goal is to improve quality of life for both the patient and the family. Values and goals of care important to patient and family were attempted to be elicited.  I created space and opportunity for ***patient and family*** to explore state of health prior to admission, thoughts, and feelings.   We discussed Her  current illness and what it means in the larger context of Her on-going co-morbidities. Natural disease trajectory and expectations were discussed.  **verbalized understanding of current illness and co-morbidities.   I discussed the importance of continued conversation with family and their medical providers regarding overall plan of care and treatment options, ensuring decisions are within the context of the patients values and GOCs.  Questions and concerns were addressed. The patient and ***family was encouraged to call with questions or concerns.  PMT will continue to support holistically as needed.  Objective:   Primary Diagnoses: Present on Admission:  Abdominal wall abscess  Pancytopenia (HCC)  Hypokalemia  Essential hypertension   Scheduled Meds:  Chlorhexidine  Gluconate Cloth  6 each Topical Daily   feeding supplement  1 Container Oral TID BM   fentaNYL   1 patch Transdermal Q72H   sodium chloride  flush  10-40 mL Intracatheter Q12H    Continuous Infusions:  piperacillin -tazobactam (ZOSYN )  IV 3.375 g (03/06/24 0736)   promethazine  (PHENERGAN ) injection (IM or IVPB) Stopped (03/05/24 1453)    PRN Meds: acetaminophen  **OR** acetaminophen , HYDROmorphone  (DILAUDID ) injection, ondansetron  (ZOFRAN ) IV, promethazine  (PHENERGAN ) injection (IM or IVPB), sodium chloride  flush  Allergies  Allergen Reactions   Enhertu  [Fam-Trastuzumab  Deruxtec-Nxki] Other (See Comments)    Headache and chest pain. See  progress note from 02/16/2024. Patient able to complete infusion.    Review of Systems Unless otherwise noted, a complete review of systems is negative.  Physical Exam General: NAD, frail chronically-ill appearing Cardiovascular: regular rate and rhythm Pulmonary: clear ant fields, diminished bilaterally  Abdomen: soft, nontender, + bowel sounds Extremities: no edema, no joint deformities Skin: no rashes, warm and dry Neurological:   Vital Signs:  BP 97/68 (BP Location:  Right Arm)   Pulse 82   Temp 97.9 F (36.6 C) (Oral)   Resp 16   Ht 5' 11 (1.803 m)   Wt 72.6 kg   SpO2 100%   BMI 22.32 kg/m  Pain Scale: 0-10 POSS *See Group Information*: S-Acceptable,Sleep, easy to arouse Pain Score: Asleep  SpO2: SpO2: 100 % O2 Device:SpO2: 100 % O2 Flow Rate: .   IO: Intake/output summary:  Intake/Output Summary (Last 24 hours) at 03/06/2024 1302 Last data filed at 03/06/2024 1015 Gross per 24 hour  Intake 2328.11 ml  Output --  Net 2328.11 ml    LBM: Last BM Date :  (PTA) Baseline Weight: Weight: 72.6 kg Most recent weight: Weight: 72.6 kg      Palliative Assessment/Data: ***   Advanced Care Planning:   Primary Decision Maker: {Primary Decision Fjxzm:78612}  Code Status/Advance Care Planning: {Palliative Code status:23503}  A discussion was had today regarding advanced directives. Concepts specific to code status, artifical feeding and hydration, continued IV antibiotics and rehospitalization was had.  The difference between a aggressive medical intervention path and a palliative comfort care path was discussed. ***The MOST form was introduced and discussed.***  Hospice and Palliative Care services outpatient were explained and offered. Patient and family verbalized their understanding and awareness of both palliative and hospice's goals and philosophy of care.   Assessment & Plan:   SUMMARY OF RECOMMENDATIONS   DNR/DNI/Full Code-as confirmed by  Continue with current plan of care  PMT will continue to support and follow as needed. Please call team line with urgent needs.  Symptom Management:  Per Attending/See below  Palliative Prophylaxis:  {Palliative Prophylaxis:21015}  Additional Recommendations (Limitations, Scope, Preferences): {Recommended Scope and Preferences:21019}  Psycho-social/Spiritual:  Desire for further Chaplaincy support: {YES NO:22349} Additional Recommendations: {PAL SOCIAL:21064}  Prognosis:  {Palliative  Care Prognosis:23504}  Discharge Planning:  {Palliative dispostion:23505}   Discussed with:     Patient*** expressed understanding and was in agreement with this plan.    Time Total: ***  Visit consisted of counseling and education dealing with the complex and emotionally intense issues of symptom management and palliative care in the setting of serious and potentially life-threatening illness.  Signed by:  Levon Borer, AGPCNP-BC [facility]   Phone: [phone] Pager: [phone] Amion: GEANNIE Freud   Thank you for allowing the Palliative Medicine Team to assist in the care of this patient. Please utilize secure chat with additional questions, if there is no response within 30 minutes please call the above phone number. Palliative Medicine Team providers are available by phone from 7am to 5pm daily and can be reached through the team cell phone.  Should this patient require assistance outside of these hours, please call the patient's attending physician.  *Please note that this is a verbal dictation therefore any spelling or grammatical errors are due to the [employer] One system interpretation.

## 2024-03-06 NOTE — Consult Note (Signed)
 Gastroenterology Inpatient Consultation   Attending Requesting Consult Raenelle Coria, MD  [facility] Day: 3  Reason for Consult Dilating bile duct and elevating alk phos previous concern pancreatic mass    History of Present Illness  Chelsea Hansen is a 53 y.o. 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    She came in for further evaluation in the setting of progressive weakness and nausea and vomiting with progressive biliary duct dilation and elevating alkaline phosphatase.   I remember this patient's case from earlier this summer when I attempted upper endoscopic ultrasound but could not visualize the head completely of the pancreas nor the uncinate as a result of a significant duodenal stenosis that I attempted dilation but was not successful at traversing.  Today she is eating her meals.  She denies pruritus.  She has not noticed any jaundice or dark in urine.  She is hesitant about undergoing further procedures but would like to feel better and get better.  I was in the room when the nurse told the patient about potential MRI/MRCP and she got upset as she has claustrophobia.  She is not sure if she can get that done if needed.  GI Review of Systems Positive as above Negative for dysphagia, melena, hematochezia  Review of Systems  General: Denies fevers/chills Cardiovascular: Denies chest pain Pulmonary: Denies shortness of breath Gastroenterological: See HPI Genitourinary: Denies darkened urine Hematological: Denies easy bruising/bleeding Dermatological: Denies jaundice Psychological: Mood is stable but more anxious after being told about potential MRI CP   Histories  Past Medical Past Medical  History:  Diagnosis Date   Allergy    seasonal allergies   Anxiety    on meds   Asthma    uses inhaler   Breast cancer (HCC) 2012   RIGHT lumpectomy   Cancer (HCC) 2022   RIGHT breast lump-dx 2022   Depression    on meds   DVT (deep venous thrombosis) (HCC) 2010   after hysterectomy   Family history of uterine cancer    GERD (gastroesophageal reflux disease)    with certain foods/OTC PRN meds   Headache(784.0)    History of radiation therapy    Bilateral breast- 09/03/21-11/02/21- Dr. Lynwood Nasuti   Hyperlipidemia    on meds   Hypertension    on meds   SVT (supraventricular tachycardia) [facility])    Past Surgical Past Surgical History:  Procedure Laterality Date   ABDOMINAL HYSTERECTOMY     AXILLARY SENTINEL NODE BIOPSY Left 07/09/2021   Procedure: LEFT AXILLARY SENTINEL NODE BIOPSY;  Surgeon: [name], MD;  Location: [facility];  Service: General;  Laterality: Left;   BALLOON DILATION N/A 12/13/2023   Procedure: MERRILL HODGKIN;  Surgeon: Federico Rosario BROCKS, MD;  Location: [facility];  Service: Gastroenterology;  Laterality: N/A;   BIOPSY  06/29/2022   Procedure: BIOPSY;  Surgeon: Legrand Victory LITTIE DOUGLAS, MD;  Location: [facility];  Service: Gastroenterology;;   BIOPSY OF SKIN SUBCUTANEOUS TISSUE AND/OR MUCOUS MEMBRANE  12/13/2023   Procedure: BIOPSY, SKIN, SUBCUTANEOUS TISSUE, OR MUCOUS MEMBRANE;  Surgeon: Federico Rosario BROCKS, MD;  Location: [facility];  Service: Gastroenterology;;   BREAST EXCISIONAL BIOPSY Right 09/16/2009   BREAST LUMPECTOMY WITH RADIOACTIVE SEED LOCALIZATION Bilateral 07/09/2021   Procedure: BILATERAL BREAST LUMPECTOMY WITH RADIOACTIVE  SEED LOCALIZATION;  Surgeon: [name], MD;  Location: [facility];  Service: General;  Laterality: Bilateral;   BREAST SURGERY     lumpectomy   ESOPHAGOGASTRODUODENOSCOPY N/A 12/13/2023   Procedure: EGD (ESOPHAGOGASTRODUODENOSCOPY);  Surgeon: Federico Rosario BROCKS, MD;  Location: [facility]  ENDOSCOPY;  Service: Gastroenterology;  Laterality: N/A;   ESOPHAGOGASTRODUODENOSCOPY N/A 12/26/2023   Procedure: EGD (ESOPHAGOGASTRODUODENOSCOPY);  Surgeon: Wilhelmenia Aloha Raddle., MD;  Location: [facility];  Service: Gastroenterology;  Laterality: N/A;   ESOPHAGOGASTRODUODENOSCOPY N/A 12/27/2023   Procedure: EGD (ESOPHAGOGASTRODUODENOSCOPY);  Surgeon: Wilhelmenia Aloha Raddle., MD;  Location: [facility];  Service: Gastroenterology;  Laterality: N/A;   ESOPHAGOGASTRODUODENOSCOPY (EGD) WITH PROPOFOL  N/A 06/29/2022   Procedure: ESOPHAGOGASTRODUODENOSCOPY (EGD) WITH PROPOFOL ;  Surgeon: Legrand Victory LITTIE DOUGLAS, MD;  Location: [facility];  Service: Gastroenterology;  Laterality: N/A;   EUS N/A 12/26/2023   Procedure: ULTRASOUND, UPPER GI TRACT, ENDOSCOPIC;  Surgeon: Wilhelmenia Aloha Raddle., MD;  Location: [facility];  Service: Gastroenterology;  Laterality: N/A;   EUS N/A 12/27/2023   Procedure: ULTRASOUND, UPPER GI TRACT, ENDOSCOPIC;  Surgeon: Wilhelmenia Aloha Raddle., MD;  Location: [facility];  Service: Gastroenterology;  Laterality: N/A;   IR CM INJ ANY COLONIC TUBE W/FLUORO  01/20/2024   IR IMAGING GUIDED PORT INSERTION  02/06/2024   IR REPLC DUODEN/JEJUNO TUBE PERCUT W/FLUORO  02/03/2024   LAPAROSCOPIC INSERTION GASTROSTOMY TUBE N/A 01/10/2024   Procedure: laparoscopic insertion of gastrostomy tube, laparoscopic insertion of jejunostomy tube, upper endoscopy;  Surgeon: Kinsinger, Herlene Righter, MD;  Location: [facility];  Service: General;  Laterality: N/A;  Laproscopic insertion   LAPAROSCOPIC ROUX-EN-Y GASTRIC BYPASS WITH HIATAL HERNIA REPAIR N/A 01/02/2024   Procedure: LAPAROSCOPIC CREATION GASTRJEJONOSTOMY;  Surgeon: Lyndel Deward PARAS, MD;  Location: [facility];  Service: General;  Laterality: N/A;  GASTROJEJUNOSTOMY   PORT-A-CATH REMOVAL Left 07/09/2021   Procedure: REMOVAL PORT-A-CATH;  Surgeon: [name], MD;  Location: [facility];  Service: General;  Laterality: Left;   PORTACATH  PLACEMENT Left 01/26/2021   Procedure: INSERTION PORT-A-CATH;  Surgeon: [name], MD;  Location: [facility];  Service: General;  Laterality: Left;   RADIOACTIVE SEED GUIDED AXILLARY SENTINEL LYMPH NODE Right 07/09/2021   Procedure: RADIOACTIVE SEED GUIDED RIGHT AXILLARY SENTINEL LYMPH NODE DISSECTION;  Surgeon: [name], MD;  Location:  SURGERY CENTER;  Service: General;  Laterality: Right;   TUBAL LIGATION     Allergies Allergies  Allergen Reactions   Enhertu  [Fam-Trastuzumab  Deruxtec-Nxki] Other (See Comments)    Headache and chest pain. See progress note from 02/16/2024. Patient able to complete infusion.   Family Family History  Problem Relation Age of Onset   Breast cancer Mother        58s   Hypertension Father    Uterine cancer Maternal Aunt    Ovarian cancer Maternal Aunt    Breast cancer Maternal Aunt        57s   Breast cancer Cousin 38   Uterine cancer Cousin 72   Colon polyps Neg Hx    Colon cancer Neg Hx    Esophageal cancer Neg Hx    Stomach cancer Neg Hx     Social Social History   Socioeconomic History   Marital status: Single    Spouse name: Not on file   Number of children: Not on file   Years of education: Not on file   Highest education level: Not on file  Occupational History   Not on file  Tobacco Use   Smoking status: Never  Smokeless tobacco: Never  Vaping Use   Vaping status: Never Used  Substance and Sexual Activity   Alcohol use: Not Currently    Alcohol/week: 7.0 standard drinks of alcohol    Types: 7 Standard drinks or equivalent per week    Comment: occassional   Drug use: No   Sexual activity: Not Currently  Other Topics Concern   Not on file  Social History Narrative   Not on file   Social Drivers of Health   Financial Resource Strain: High Risk (02/13/2024)   Overall Financial Resource Strain (CARDIA)    Difficulty of Paying Living Expenses: Hard  Food Insecurity: No Food Insecurity (03/05/2024)    Hunger Vital Sign    Worried About Running Out of Food in the Last Year: Never true    Ran Out of Food in the Last Year: Never true  Recent Concern: Food Insecurity - Food Insecurity Present (02/16/2024)   Hunger Vital Sign    Worried About Running Out of Food in the Last Year: Sometimes true    Ran Out of Food in the Last Year: Sometimes true  Transportation Needs: No Transportation Needs (03/05/2024)   PRAPARE - Administrator, Civil Service (Medical): No    Lack of Transportation (Non-Medical): No  Physical Activity: Inactive (02/13/2024)   Exercise Vital Sign    Days of Exercise per Week: 0 days    Minutes of Exercise per Session: 0 min  Stress: No Stress Concern Present (02/13/2024)   [employer] of Occupational Health - Occupational Stress Questionnaire    Feeling of Stress: Not at all  Social Connections: Moderately Isolated (02/13/2024)   Social Connection and Isolation Panel    Frequency of Communication with Friends and Family: More than three times a week    Frequency of Social Gatherings with Friends and Family: Once a week    Attends Religious Services: 1 to 4 times per year    Active Member of [employer] or Organizations: No    Attends Banker Meetings: Never    Marital Status: Never married  Intimate Partner Violence: Not At Risk (03/05/2024)   Humiliation, Afraid, Rape, and Kick questionnaire    Fear of Current or Ex-Partner: No    Emotionally Abused: No    Physically Abused: No    Sexually Abused: No   The patient denies Drug, Alcohol, or Tobacco use.   Occupation is Travel   Medications  Home Medications No current facility-administered medications on file prior to encounter.   Current Outpatient Medications on File Prior to Encounter  Medication Sig Dispense Refill   anastrozole  (ARIMIDEX ) 1 MG tablet Take 1 mg by mouth daily.     fentaNYL  (DURAGESIC ) 12 MCG/HR Place 1 patch onto the skin every 3 (three) days. 5 patch 0    lidocaine -prilocaine  (EMLA ) cream Apply to affected area once 30 g 3   montelukast  (SINGULAIR ) 10 MG tablet Take 10 mg by mouth at bedtime.     ondansetron  (ZOFRAN ) 8 MG tablet Take 1 tablet (8 mg total) by mouth every 8 (eight) hours as needed for nausea or vomiting. Start on the third day after chemotherapy. 30 tablet 1   oxyCODONE  (ROXICODONE ) 15 MG immediate release tablet Take 1 tablet (15 mg total) by mouth every 4 (four) hours as needed for pain. 90 tablet 0   pantoprazole  (PROTONIX ) 40 MG tablet Take 1 tablet (40 mg total) by mouth 2 (two) times daily. (Patient taking differently: Take 40 mg by mouth 2 (  two) times daily as needed.) 60 tablet 0   prochlorperazine  (COMPAZINE ) 10 MG tablet Take 1 tablet (10 mg total) by mouth every 6 (six) hours as needed for nausea or vomiting. 30 tablet 1   ciprofloxacin  (CIPRO ) 500 MG tablet Take 1 tablet (500 mg total) by mouth 2 (two) times daily. (Patient not taking: Reported on 03/05/2024) 14 tablet 0   dexamethasone  (DECADRON ) 4 MG tablet Take 2 tablets (8 mg) by mouth daily for 3 days starting the day after chemotherapy. Take with food. (Patient not taking: No sig reported) 30 tablet 1   polyethylene glycol powder (GLYCOLAX /MIRALAX ) 17 GM/SCOOP powder Mix 17 g in 4 oz of liquid and take by mouth 2 (two) times daily as needed. (Patient not taking: No sig reported) 3350 g 1   senna (SENOKOT) 8.6 MG TABS tablet Take 2 tablets (17.2 mg total) by mouth 2 (two) times daily. (Patient not taking: No sig reported) 90 tablet 3   sulfamethoxazole -trimethoprim  (BACTRIM  DS) 800-160 MG tablet Take 1 tablet by mouth 2 (two) times daily. (Patient not taking: Reported on 03/05/2024) 14 tablet 0   thiamine  (VITAMIN B1) 100 MG tablet Take 1 tablet (100 mg total) by mouth daily. (Patient not taking: Reported on 02/23/2024) 30 tablet 0   Scheduled Inpatient Medications  Chlorhexidine  Gluconate Cloth  6 each Topical Daily   feeding supplement  1 Container Oral TID BM   fentaNYL    1 patch Transdermal Q72H   sodium chloride  flush  10-40 mL Intracatheter Q12H   Continuous Inpatient Infusions  piperacillin -tazobactam (ZOSYN )  IV 3.375 g (03/06/24 1510)   promethazine  (PHENERGAN ) injection (IM or IVPB) Stopped (03/05/24 1453)   PRN Inpatient Medications acetaminophen  **OR** acetaminophen , HYDROmorphone  (DILAUDID ) injection, LORazepam , ondansetron  (ZOFRAN ) IV, promethazine  (PHENERGAN ) injection (IM or IVPB), sodium chloride  flush   Physical Examination  BP 92/61 (BP Location: Right Arm)   Pulse 78   Temp 98.2 F (36.8 C) (Oral)   Resp 17   Ht 5' 11 (1.803 m)   Wt 72.6 kg   SpO2 100%   BMI 22.32 kg/m  GEN: NAD, appears chronically ill but is nontoxic (she looks better than when I saw her in the summer)  PSYCH: Cooperative, without pressured speech EYE: Conjunctivae pink, sclerae anicteric ENT: MMM CV: Nontachycardic RESP: No audible wheezing GI: NABS, soft, protuberant abdomen, surgical scars present, G-tube in place, tenderness to palpation noted throughout abdomen MSK/EXT: No significant lower extremity edema SKIN: No jaundic NEURO:  Alert & Oriented x 3, no focal deficits   Review of Data  I reviewed the following data at the time of this encounter:  Laboratory Studies   Recent Labs  Lab 03/06/24 1000  NA 141  K 4.1  CL 104  CO2 26  BUN 10  CREATININE 0.86  GLUCOSE 114*  CALCIUM  8.8*  MG 2.0  PHOS 3.6   Recent Labs  Lab 03/06/24 1000  AST 70*  ALT 52*  ALKPHOS 1,138*    Recent Labs  Lab 03/04/24 2305 03/04/24 2316 03/05/24 1210 03/06/24 1322  WBC 3.2*  --  2.9* 3.4*  HGB 7.6*   < > 7.0* 7.2*  HCT 23.7*   < > 22.4* 23.1*  PLT 138*  --  138* 124*   < > = values in this interval not displayed.   No results for input(s): APTT, INR in the last 168 hours.  Imaging Studies  9/15 CTAP MPRESSION: 1. Improved left upper quadrant abdominal wall abscess with residual 1.4 x 1.8 cm  fluid collection and persistent inflammatory  change. No new loculated fluid collections. 2. Progressive dilation of the extrahepatic bile duct to 15 mm with abrupt caliber change within the head of the pancreas. Known pancreatic head mass is not clearly identified. No intrahepatic biliary ductal dilation. 3. Omental infiltration consistent with peritoneal carcinomatosis, unchanged from prior examination. 4. Diffuse mixed lytic and sclerotic patterns within the axial skeleton consistent with widespread osseous metastatic disease. No acute bone abnormality.  9/8 CTAP IMPRESSION: 1. Multiloculated peripherally enhancing fluid collection in the left peritoneum extending into the left abdominal oblique musculature at the site of the prior jejunostomy with a soft tissue tract extending to the skin surface. The fluid collection closely abuts the jejunum. If there is concern for contained perforation, CT with IV and oral contrast is recommended. 2. 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 3. Known pancreatic head mass not well visualized. 4. Similar peritoneal carcinomatosis. 5. Similar indeterminate cluster nodularity in the bilateral upper lobes. This may be infectious/inflammatory or due to metastases. 6. No pulmonary embolism.  8/14 CTAP IMPRESSION: 1. Known pancreatic head mass is not well visualized on this examination. Progressive extrahepatic biliary ductal dilation with abrupt cut off in the region of the pancreatic head in the expected location of the pancreatic head mass better seen on MRI examination of 12/23/2023. 2. Progressive, moderate right hydronephrosis and proximal hydroureter to the level of the mid ureter. No intrarenal or ureteral calculi are identified and the findings may relate to an underlying intrinsic stricture. 3. Progressive peritoneal carcinomatosis. 4. Interval development of mild ascites. 5. Percutaneous jejunostomy catheter is in place, however, the retaining balloon has been  further withdrawn and is now within the tract within the anterior abdominal wall. 6. Stable osseous metastatic disease with pathologic fracture of the L3 vertebral body with mild loss of height. 7. Pancolonic diverticulosis without superimposed acute inflammatory change.  GI Procedures and Studies  July 2025 EGD - No gross lesions in the proximal esophagus. - LA Grade C esophagitis with no bleeding found distally. - 2 cm hiatal hernia. - Retained gastric fluid. - Erythematous mucosa in the stomach. No change from yesterday. 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 Dilated under fluoroscopy up to 12 mm. It is felt that this duodenal stenosis is greater than 5 cm in length. Unable to traverse even after dilation.  July 2025 EUS EGD impression: - No gross lesions in the proximal esophagus. - LA Grade C esophagitis with no bleeding found distally (appears improved from prior). - Z-line irregular, 39 cm from the incisors. - 3 cm hiatal hernia. - Retained gastric fluid. 900 cc removed on today's procedure. - J-shaped and dilation deformity in the entire stomach. - Erythematous mucosa in the stomach. Previously biopsied and negative for HP. - No gross lesions in the duodenal bulb. 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 Biopsied. EUS impression: - Pancreatic parenchymal abnormalities consisting of diffuse echogenicity and lobularity were noted in the pancreatic head, genu of the pancreas, pancreatic body and pancreatic tail. No overt mass/lesion was noted in this particular regions visualized. Uncinate process not able to be visualized. - The pancreatic duct within the head was prominent and was dilated in the genu of the pancreas, body of the pancreas and tail of the pancreas. - There was a normal-appearing common bile duct and prominent/slightly dilated common hepatic duct. - No malignant-appearing lymph nodes were  visualized in the celiac region (level  20), peripancreatic region and porta hepatis region.   Assessment  Ms. Tangen is a 53 y.o. female.  The GI service is consulted for evaluation and management of dilating bile duct and elevating alkaline phosphatase.  This patient is hemodynamically stable currently.  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  I could never see the major papilla as a result of the significant duodenal stenosis.  It is possible that now that the patient has had surgical GJ bypass that things have calm down, or they may not be possible.  The only way to tell would really be a repeat upper endoscopy with possible EUS attempt.  If we are not able to access this area, then the only way to evaluate/interrogate the biliary duct would be the IR PTBD.  She is not showing evidence of jaundice, but if alk phos continues to rise, I suspect she will become jaundiced in the coming weeks.  I would like an MRI/MRCP to be performed to try to further guide information and see if they can visualize something in the pancreas again or if again we are not seeing any evidence of a pancreas mass and if this ends up being some sort of ampullary lesion.  This patient's case is complex and difficult as a result of the significant anatomical changes that have occurred as well as her history of metastatic breast cancer known peritoneal metastasis/carcinomatosis.  We will allow the patient to get some Ativan  in an effort of trying to do MRI/MRCP.  If we perform EGD +/- EUS it would be no sooner than Thursday based on availability from an anesthesia standpoint and current procedure load/cases in the hospital.  She is amenable to trying to get MRI and follow-up tomorrow to consider EUS  repeat attempt.  The risks of an EUS including intestinal perforation, bleeding, infection, aspiration, and medication effects were discussed as was the possibility it may not give a definitive diagnosis if a biopsy is performed.  When a biopsy of the pancreas is done as part of the EUS, there is an additional risk of pancreatitis at the rate of about 1-2%.  It was explained that procedure related pancreatitis is typically mild, although it can be severe and even life threatening, which is why we do not perform random pancreatic biopsies and only biopsy a lesion/area we feel is concerning enough to warrant the risk.  All patient questions were answered to the best of my ability, and the patient agrees to the aforementioned plan of action with follow-up as indicated.   Plan/Recommendations  Patient may eat today N.p.o. at midnight for MRI/MRCP - 1 mg Ativan  order in place for when patient is on-call 30 to 60 minutes before MRI/MRCP Possible endoscopy ultrasound on Thursday versus Friday (pending results of MRI/MRCP) - As noted above and discussed with the patient and surgical team, patient's significant duodenal stenosis may prevent us  from being able to perform EUS or having even an attempt at further biliary decompression in the near future IR may need to be our backup Etiology of anemia not clear but most likely related to her surgical bypass as well as her multiple surgical interventions and bone marrow suppression-no plan for endoscopic evaluation other than potential endoscopy (no colonoscopy at this time)   Thank you for this consult.  We will continue to follow.  Please page/call with questions or concerns.   Aloha Finner, MD [facility] Advanced [facility]oscopy Office # [phone]

## 2024-03-06 NOTE — Progress Notes (Signed)
 Chelsea Hansen   DOB:Apr 12, 1971   FM#:[id number]   RDW#:[id number]  Subjective: Patient admitted for abdominal abscess and infection, nausea, vomiting.  She is under treatment with hospitalists with general surgery consulting.   History:  multiple admissions for nausea and vomiting leading to discovery of pancreatic mass causing duodenal stricture (failed EUS and biopsy x 2) requiring laparoscopic gastrojejunostomy to bypass stricture (01/02/2024) followed by G tube and J tube placement in light of continued vomiting (01/10/24).  Unable to obtain pancreatic mass biopsy, however patient developed peritoneal carcinomatosis and biopsy demonstrated metastatic carcinoma, consistent with breast primary: ER 100% positive, PR negative, HER2 (2+).  She began treatment with Enhertu  on 02/15/2024 at dose reduction of 3.2 mg/kg to be given every 3 weeks.    She has struggled as outpatient with her electrolytes and was admitted most recently for nausea/vomiting and abdominal abscess.  She is receiving IV antibiotics and is feeling better.     Objective:  Vitals:   03/05/24 2122 03/06/24 0455  BP: 97/64 97/68  Pulse: 77 82  Resp: 16 16  Temp: 98.3 F (36.8 C) 97.9 F (36.6 C)  SpO2: 97% 100%    Body mass index is 22.32 kg/m.  Intake/Output Summary (Last 24 hours) at 03/06/2024 1324 Last data filed at 03/06/2024 1015 Gross per 24 hour  Intake 2328.11 ml  Output --  Net 2328.11 ml     Sclerae unicteric  Oropharynx shows no thrush or other lesions  No cervical or supraclavicular adenopathy  Lungs no rales or wheezes--auscultated anterolaterally  Heart regular rate and rhythm  Abdomen soft, +BS G tube in place, previous j tube site runoff into ostomy bag  Neuro nonfocal  Breast exam:     Labs:    Basic Metabolic Panel: Recent Labs  Lab 03/04/24 2305 03/04/24 2316 03/05/24 1210 03/06/24 1000  NA 137 136 137 141  K 2.7* 2.7* 3.8 4.1  CL 96* 93* 98 104  CO2 28  --  26 26  GLUCOSE 108* 110*  100* 114*  BUN 18 18 15 10   CREATININE 0.92 1.10* 0.72 0.86  CALCIUM  8.9  --  8.9 8.8*  MG  --   --  2.1 2.0  PHOS  --   --   --  3.6   GFR Estimated Creatinine Clearance: 86.5 mL/min (by C-G formula based on SCr of 0.86 mg/dL). Liver Function Tests: Recent Labs  Lab 03/04/24 2305 03/06/24 1000  AST 47* 70*  ALT 45* 52*  ALKPHOS 1,109* 1,138*  BILITOT 0.4 0.3  PROT 6.1* 5.6*  ALBUMIN  3.3* 3.0*    CBC: Recent Labs  Lab 03/04/24 2305 03/04/24 2316 03/05/24 1210  WBC 3.2*  --  2.9*  NEUTROABS 1.5*  --  1.4*  HGB 7.6* 7.5* 7.0*  HCT 23.7* 22.0* 22.4*  MCV 80.6  --  81.5  PLT 138*  --  138*     Assessment/Plan: 53 y.o. 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    Abdominal abscess: continue antibiotics and follow recommendations from surgery and hospitalist team.  I informed patient she cannot have active infection and receive treatment for her cancer.  I recommended she follow medical advice and stay in the hospital to ensure she improves so we can keep giving her cancer treatment.   Nausea vomiting: Improved today. Zofran /Phenergan  as PRN medications.  She is eating and tells me nausea is improved today.   Metastatic breast cancer: On treatment with  Enhertu .  It is due tomorrow but I informed her we cannot give her treatment with an active infection.  We will postpone her appointments x 1 week.  She has received one cycle of Enhertu  and is motivated to continue treatment.   Other medical issues per primary hospitalist team.  Thank you for your excellent care of this patient.     Time Spent: 30 minutes   [name], NP 03/06/2024  1:24 PM Medical Oncology and Hematology [facility] [address] [place], [place] [address] Tel. [phone]    Fax. [phone]

## 2024-03-07 ENCOUNTER — Inpatient Hospital Stay: Payer: MEDICAID

## 2024-03-07 ENCOUNTER — Inpatient Hospital Stay (HOSPITAL_COMMUNITY): Payer: MEDICAID

## 2024-03-07 ENCOUNTER — Ambulatory Visit: Payer: MEDICAID

## 2024-03-07 ENCOUNTER — Ambulatory Visit: Payer: MEDICAID | Admitting: Gastroenterology

## 2024-03-07 ENCOUNTER — Inpatient Hospital Stay: Payer: MEDICAID | Admitting: Adult Health

## 2024-03-07 DIAGNOSIS — L02211 Cutaneous abscess of abdominal wall: Secondary | ICD-10-CM | POA: Diagnosis not present

## 2024-03-07 DIAGNOSIS — R7989 Other specified abnormal findings of blood chemistry: Secondary | ICD-10-CM

## 2024-03-07 DIAGNOSIS — R112 Nausea with vomiting, unspecified: Secondary | ICD-10-CM | POA: Diagnosis not present

## 2024-03-07 DIAGNOSIS — D649 Anemia, unspecified: Secondary | ICD-10-CM

## 2024-03-07 DIAGNOSIS — C786 Secondary malignant neoplasm of retroperitoneum and peritoneum: Secondary | ICD-10-CM | POA: Insufficient documentation

## 2024-03-07 DIAGNOSIS — Z515 Encounter for palliative care: Secondary | ICD-10-CM | POA: Diagnosis not present

## 2024-03-07 DIAGNOSIS — K838 Other specified diseases of biliary tract: Secondary | ICD-10-CM

## 2024-03-07 DIAGNOSIS — K8689 Other specified diseases of pancreas: Secondary | ICD-10-CM

## 2024-03-07 DIAGNOSIS — G893 Neoplasm related pain (acute) (chronic): Secondary | ICD-10-CM | POA: Diagnosis not present

## 2024-03-07 DIAGNOSIS — K315 Obstruction of duodenum: Secondary | ICD-10-CM | POA: Diagnosis not present

## 2024-03-07 DIAGNOSIS — R748 Abnormal levels of other serum enzymes: Secondary | ICD-10-CM | POA: Diagnosis not present

## 2024-03-07 DIAGNOSIS — K632 Fistula of intestine: Secondary | ICD-10-CM | POA: Diagnosis not present

## 2024-03-07 DIAGNOSIS — K831 Obstruction of bile duct: Secondary | ICD-10-CM

## 2024-03-07 LAB — CBC
HCT: 27.3 % — ABNORMAL LOW (ref 36.0–46.0)
Hemoglobin: 8.5 g/dL — ABNORMAL LOW (ref 12.0–15.0)
MCH: 25.5 pg — ABNORMAL LOW (ref 26.0–34.0)
MCHC: 31.1 g/dL (ref 30.0–36.0)
MCV: 82 fL (ref 80.0–100.0)
Platelets: 107 K/uL — ABNORMAL LOW (ref 150–400)
RBC: 3.33 MIL/uL — ABNORMAL LOW (ref 3.87–5.11)
RDW: 18.6 % — ABNORMAL HIGH (ref 11.5–15.5)
WBC: 4.4 K/uL (ref 4.0–10.5)
nRBC: 0 % (ref 0.0–0.2)

## 2024-03-07 LAB — CBC WITH DIFFERENTIAL/PLATELET
Abs Immature Granulocytes: 0.31 K/uL — ABNORMAL HIGH (ref 0.00–0.07)
Basophils Absolute: 0 K/uL (ref 0.0–0.1)
Basophils Relative: 1 %
Eosinophils Absolute: 0.1 K/uL (ref 0.0–0.5)
Eosinophils Relative: 2 %
HCT: 22.1 % — ABNORMAL LOW (ref 36.0–46.0)
Hemoglobin: 6.8 g/dL — CL (ref 12.0–15.0)
Immature Granulocytes: 9 %
Lymphocytes Relative: 32 %
Lymphs Abs: 1.2 K/uL (ref 0.7–4.0)
MCH: 25.5 pg — ABNORMAL LOW (ref 26.0–34.0)
MCHC: 30.8 g/dL (ref 30.0–36.0)
MCV: 82.8 fL (ref 80.0–100.0)
Monocytes Absolute: 0.4 K/uL (ref 0.1–1.0)
Monocytes Relative: 10 %
Neutro Abs: 1.7 K/uL (ref 1.7–7.7)
Neutrophils Relative %: 46 %
Platelets: 107 K/uL — ABNORMAL LOW (ref 150–400)
RBC: 2.67 MIL/uL — ABNORMAL LOW (ref 3.87–5.11)
RDW: 19.5 % — ABNORMAL HIGH (ref 11.5–15.5)
Smear Review: NORMAL
WBC: 3.6 K/uL — ABNORMAL LOW (ref 4.0–10.5)
nRBC: 0 % (ref 0.0–0.2)

## 2024-03-07 LAB — PREPARE RBC (CROSSMATCH)

## 2024-03-07 MED ORDER — LORAZEPAM 2 MG/ML IJ SOLN
1.0000 mg | Freq: Once | INTRAMUSCULAR | Status: AC
Start: 2024-03-07 — End: 2024-03-07
  Administered 2024-03-07: 1 mg via INTRAVENOUS
  Filled 2024-03-07: qty 1

## 2024-03-07 MED ORDER — SODIUM CHLORIDE 0.9% IV SOLUTION: Freq: Once | INTRAVENOUS | Status: AC

## 2024-03-07 MED ORDER — FENTANYL 12 MCG/HR TD PT72
1.0000 | MEDICATED_PATCH | TRANSDERMAL | Status: DC
  Administered 2024-03-07: 1 via TRANSDERMAL
  Filled 2024-03-07: qty 1

## 2024-03-07 MED ORDER — GADOBUTROL 1 MMOL/ML IV SOLN
7.0000 mL | Freq: Once | INTRAVENOUS | Status: AC | PRN
Start: 2024-03-07 — End: 2024-03-07
  Administered 2024-03-07: 7 mL via INTRAVENOUS

## 2024-03-07 NOTE — Progress Notes (Signed)
 Date and time results received: 03/07/24 0612   Test: Hgb Critical Value: 6.8  Name of Provider Notified: Blondie Agent NP

## 2024-03-07 NOTE — Progress Notes (Signed)
 Chelsea Hansen   DOB:06-15-1971   FM#:[id number]   RDW#:[id number]  Subjective: Chelsea Hansen is a 53 year old woman with metastatic breast cancer to the bone and peritineum, pancreatic mass causing duodenal stricture s/p gastrojejunostomy to bypass stricture on 01/02/2024 and G tube and J tube placement on 01/10/2024.  She has since undergone J tube removal and is admitted with abdominal abscess, nausea, and vomiting.    Chelsea Hansen is sitting up in bed today and tells me that she feels well.  She underwent MRCP earlier today that noted dilated common bile duct and stable pancreatic mass and peritoneal carcinomatosis.  She was noted to have catheter tract in left lower quadrant ventral abdominal wall.  Small intramuscular abscess with enhancing wall was identified within the left anterior abdominal wall measuring 3.9 x 1.4 cm, previously 4.2 x 2.2 cm.     Objective:  Vitals:   03/07/24 0959 03/07/24 0959  BP: 104/70 104/70  Pulse: 70 70  Resp: 18 18  Temp: 97.6 F (36.4 C) 97.6 F (36.4 C)  SpO2: 100% 100%    Body mass index is 23.21 kg/m.  Intake/Output Summary (Last 24 hours) at 03/07/2024 1340 Last data filed at 03/07/2024 0959 Gross per 24 hour  Intake 565 ml  Output 60 ml  Net 505 ml     Sclerae unicteric  Oropharynx shows no thrush or other lesions  No cervical or supraclavicular adenopathy  Lungs no rales or wheezes--auscultated anterolaterally  Heart regular rate and rhythm  Abdomen soft, +BS, mild TTP  Neuro nonfocal    Labs:     Basic Metabolic Panel: Recent Labs  Lab 03/04/24 2305 03/04/24 2316 03/05/24 1210 03/06/24 1000  NA 137 136 137 141  K 2.7* 2.7* 3.8 4.1  CL 96* 93* 98 104  CO2 28  --  26 26  GLUCOSE 108* 110* 100* 114*  BUN 18 18 15 10   CREATININE 0.92 1.10* 0.72 0.86  CALCIUM  8.9  --  8.9 8.8*  MG  --   --  2.1 2.0  PHOS  --   --   --  3.6   GFR Estimated Creatinine Clearance: 86.5 mL/min (by C-G formula based on SCr of 0.86 mg/dL). Liver Function  Tests: Recent Labs  Lab 03/04/24 2305 03/06/24 1000  AST 47* 70*  ALT 45* 52*  ALKPHOS 1,109* 1,138*  BILITOT 0.4 0.3  PROT 6.1* 5.6*  ALBUMIN  3.3* 3.0*    CBC: Recent Labs  Lab 03/04/24 2305 03/04/24 2316 03/05/24 1210 03/06/24 1322 03/07/24 0540  WBC 3.2*  --  2.9* 3.4* 3.6*  NEUTROABS 1.5*  --  1.4* 1.6* 1.7  HGB 7.6* 7.5* 7.0* 7.2* 6.8*  HCT 23.7* 22.0* 22.4* 23.1* 22.1*  MCV 80.6  --  81.5 81.9 82.8  PLT 138*  --  138* 124* 107*     Assessment/Plan: 53 y.o. 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   Abdominal Abscess: antibiotics discontinued per surgery team and area considered fistula tract. Will follow patient closely for signs of infection as she was febrile on 9/8 in outpatient clinic and was placed on oral Cipro  and Bactrim  at that time.   Nausea/Vomiting: improved.  Continue PRN medications.  Patient is eating lunch and tells me she is not nauseated.   Anemia: likely multifactorial due to chronic illness and her Enhertu  treatment.  Recommend transfusion for hemoglobin less than 7.   Pain: Controlled with current regimen, Levon Freud, NP from Palliative  care following and managing pain, and will continue to follow as outpatient.   Biliary dilation: GI is consulting, potential EGD +/- EUS tomorrow or Friday.  .     Other medical issues as per primary hospitalist team. Thank you for your excellent care of this patient.    Time Spent: 30 minutes  [name], NP 03/07/2024  1:40 PM Medical Oncology and Hematology [facility] [address] [place], [place] [address] Tel. [phone]    Fax. [phone]

## 2024-03-07 NOTE — Progress Notes (Addendum)
 [facility] Progress Note  CC:  Dilating bile duct and elevating alk phos previous concern pancreatic mass   Subjective: She has tolerated a soft diet.  No nausea or vomiting.  She has generalized abdominal pain.  No bowel movement for the past week.  She denies having any chest pain or shortness of breath.  No family at the bedside.   Objective:  Vital signs in last 24 hours: Temp:  [97.3 F (36.3 C)-98.9 F (37.2 C)] 97.6 F (36.4 C) (09/17 0959) Pulse Rate:  [67-78] 70 (09/17 0959) Resp:  [16-18] 18 (09/17 0959) BP: (89-104)/(56-70) 104/70 (09/17 0959) SpO2:  [95 %-100 %] 100 % (09/17 0959) Weight:  [75.5 kg] 75.5 kg (09/17 0529) Last BM Date :  (PTA) General: Chronically ill appearing 53 year old female in no acute distress. Heart: Regular rate and rhythm, no murmurs. Pulm: Breath sounds clear throughout. Abdomen: Abdomen is protuberant, distended with hypoactive bowel sounds to all 4 quadrants.  Generalized tenderness throughout without rebound or guarding. G-tube site intact, clamped. LUQ prior J tube site with watery brown fluid in collection bag.  Extremities: No lower extremity edema. Neurologic:  Alert and oriented x 4. Grossly normal neurologically. Psych:  Alert and cooperative. Normal mood and affect.  Intake/Output from previous day: 09/16 0701 - 09/17 0700 In: 600 [P.O.:590; I.V.:10] Out: 60 [Drains:60] Intake/Output this shift: Total I/O In: 315 [Blood:315] Out: -   Lab Results: Recent Labs    03/05/24 1210 03/06/24 1322 03/07/24 0540  WBC 2.9* 3.4* 3.6*  HGB 7.0* 7.2* 6.8*  HCT 22.4* 23.1* 22.1*  PLT 138* 124* 107*   BMET Recent Labs    03/04/24 2305 03/04/24 2316 03/05/24 1210 03/06/24 1000  NA 137 136 137 141  K 2.7* 2.7* 3.8 4.1  CL 96* 93* 98 104  CO2 28  --  26 26  GLUCOSE 108* 110* 100* 114*  BUN 18 18 15 10   CREATININE 0.92 1.10* 0.72 0.86  CALCIUM  8.9  --  8.9 8.8*   LFT Recent Labs    03/06/24 1000  PROT  5.6*  ALBUMIN  3.0*  AST 70*  ALT 52*  ALKPHOS 1,138*  BILITOT 0.3   PT/INR No results for input(s): LABPROT, INR in the last 72 hours. Hepatitis Panel No results for input(s): HEPBSAG, HCVAB, HEPAIGM, HEPBIGM in the last 72 hours.  MR 3D Recon At Scanner Result Date: 03/07/2024 : CLINICAL DATA: Pancreatic mass, monitor biliary obstruction from pancreatic head lesion, history of metastatic breast cancer EXAM: MRI ABDOMEN WITHOUT AND WITH CONTRAST (INCLUDING MRCP) TECHNIQUE: Multiplanar multisequence MR imaging of the abdomen was performed both before and after the administration of intravenous contrast. Heavily T2-weighted images of the biliary and pancreatic ducts were obtained, and three-dimensional MRCP images were rendered by post processing. CONTRAST: 7mL GADAVIST  GADOBUTROL  1 MMOL/ML IV SOLN COMPARISON: CT abdomen pelvis March 05, 2024, MR abdomen December 23, 2023 FINDINGS: Lower chest: No pleural effusion. Hepatobiliary/MRCP: Non cirrhotic morphology. Prominent central intrahepatic bile ducts. No focal liver lesion.No significant hepatic steatosis. Gallbladder is normal. Common bile duct is dilated measuring up to 12 mm proximally and tapers to 4 mm at the pancreatic head. Pancreas: At the region of pancreatic uncinate process there is an ill-defined mildly enhancing mass inseparable from the proximal third portion of duodenum measuring 2.4 x 2 cm with upstream dilation of the pancreatic duct up to 4 mm. This lesion was previously measured 2.5 x 2.2 cm on December 23, 2023. The mass is abutting the  superior mesenteric vein. SMA is not involved. Spleen: Within normal limits in size and appearance. Adrenals/Urinary Tract: Stable bilateral simple cortical cysts which does not require imaging follow-up. No evidence of hydronephrosis. Stomach/Bowel: Gastrostomy tube is in place. No bowel obstruction. Vascular/Lymphatic: No pathologically enlarged lymph nodes identified. No abdominal aortic  aneurysm demonstrated. Other: Enhancing nodule measuring 2.6 x 1.5 cm along the left mesocolon consistent with peritoneal carcinomatosis (21/71), stable to prior. Catheter tract in left lower quadrant ventral abdominal wall. Small intramuscular abscess with enhancing wall is identified within the left anterior abdominal wall measuring 3.9 x 1.4 cm, previously measured 4.2 x 2.2 cm (remeasured in the same manner). 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 Musculoskeletal: Heterogeneous vertebral spine consistent with known osseous metastasisStable compression deformity of L3 vertebral body. IMPRESSION: Ill-defined mass at the region of pancreatic uncinate process/third portion of duodenum with associated upstream dilation of the common bile duct and pancreatic duct, grossly stable to prior and likely neoplastic. Recommend tissue sampling if not yet performed. Peritoneal carcinomatosis as detailed above, stable to prior. Left anterior abdominal wall abscess, stable. Heterogeneous bone marrow of the vertebral spine and L3 compression deformity consistent with known osseous metastasis from breast primary, better assessed on prior CT. Electronically Signed By: Megan Zare M.D. On: 03/07/2024 12:25 Electronically Signed   By: Duwaine Severs M.D.   On: 03/07/2024 12:29   MR ABDOMEN MRCP W WO CONTAST Result Date: 03/07/2024 CLINICAL DATA:  Pancreatic mass, monitor biliary obstruction from pancreatic head lesion, history of metastatic breast cancer EXAM: MRI ABDOMEN WITHOUT AND WITH CONTRAST (INCLUDING MRCP) TECHNIQUE: Multiplanar multisequence MR imaging of the abdomen was performed both before and after the administration of intravenous contrast. Heavily T2-weighted images of the biliary and pancreatic ducts were obtained, and three-dimensional MRCP images were rendered by post processing. CONTRAST:  7mL GADAVIST  GADOBUTROL  1 MMOL/ML IV SOLN COMPARISON:   CT abdomen pelvis March 05, 2024, MR abdomen December 23, 2023 FINDINGS: Lower chest: No pleural effusion. Hepatobiliary/MRCP: Non cirrhotic morphology. Prominent central intrahepatic bile ducts. No focal liver lesion.No significant hepatic steatosis. Gallbladder is normal. Common bile duct is dilated measuring up to 12 mm proximally and tapers to 4 mm at the pancreatic head. Pancreas: At the region of pancreatic uncinate process there is an ill-defined mildly enhancing mass inseparable from the proximal third portion of duodenum measuring 2.4 x 2 cm with upstream dilation of the pancreatic duct up to 4 mm. This lesion was previously measured 2.5 x 2.2 cm on December 23, 2023. The mass is abutting the superior mesenteric vein. SMA is not involved. Spleen:  Within normal limits in size and appearance. Adrenals/Urinary Tract: Stable bilateral simple cortical cysts which does not require imaging follow-up. No evidence of hydronephrosis. Stomach/Bowel: Gastrostomy tube is in place.  No bowel obstruction. Vascular/Lymphatic: No pathologically enlarged lymph nodes identified. No abdominal aortic aneurysm demonstrated. Other: Enhancing nodule measuring 2.6 x 1.5 cm along the left mesocolon consistent with peritoneal carcinomatosis (21/71), stable to prior. Catheter tract in left lower quadrant ventral abdominal wall. Small intramuscular abscess with enhancing wall is identified within the left anterior abdominal wall measuring 3.9 x 1.4 cm, previously measured 4.2 x 2.2 cm (remeasured in the same manner). 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 Musculoskeletal: Heterogeneous vertebral spine consistent with known osseous metastasisStable compression deformity of L3 vertebral body. IMPRESSION: Ill-defined mass at the region of pancreatic uncinate process/third portion of duodenum with  associated upstream dilation of the common bile duct and pancreatic duct,  grossly stable to prior and likely neoplastic. Recommend tissue sampling if not yet performed. Peritoneal carcinomatosis as detailed above, stable to prior. Left anterior abdominal wall abscess, stable. Heterogeneous bone marrow of the vertebral spine and L3 compression deformity consistent with known osseous metastasis from breast primary, better assessed on prior CT. Electronically Signed   By: Megan  Zare M.D.   On: 03/07/2024 12:25   Patient Profile: Chelsea Hansen is a 53 y.o. female with a pmh significant for metastatic breast cancer (with peritoneal carcinomatosis), asthma, allergies, duodenal obstruction (previous concern for pancreatic lesion not able to be visualized per EUS due to stenosis) leading to gastrojejunostomy bypass by surgery 01/02/2024 then underwent G-tube and J-tube placement on January 10, 2024 with complications of abdominal fluid collections. Admitted 03/06/2024 with nausea, vomiting and progressive weakness.  Assessment / Plan:  53 year old female with generalized weakness, N/V, progressive biliary duct dilatation with elevated alk phos levels.  Abdominal MRI/MRCP today showed an ill-defined mass at the region of the pancreatic mucinous process/third portion of the duodenum with associated upstream dilatation of the CBD and PD. Labs 9/16: Alk Phos 1,138. T. Bili 0.3. AST 70. ALT52.  Patient tentatively scheduled for EGD and EUS 9/18, patient's significant duodenal stenosis may result in an unsuccessful EUS attempt, may require IR backup - EUS scheduled with Dr. Wilhelmenia 03/08/2024 - NPO after midnight  - IV fluids and pain management per the hospitalist - Ondansetron  4 mg IV every 6 hours as needed - BMP and hepatic panel in a.m. - Palliative care following - Await further recommendations per Dr. Wilhelmenia  Normocytic anemia. Hg 7.2 -> 6.8. Transfused 1 unit of PRBCs today. Etiology of anemia not clear but most likely related to her surgical bypass as well as her multiple  surgical interventions and bone marrow suppression.  - EGD at time of EUS 9/18, no plans for a colonoscopy  - Check H/H post transfusion  - CBC in am  Thrombocytopenia, PLT 124 -> 127.  Past history of metastatic breast cancer with peritoneal carcinomatosis and osseous metastasis. Abdominal MRI today showed peritoneal carcinomatosis known osseous metastasis to the vertebral spine/L3 compression deformity.  Left anterior abdominal wall abscess, stable per abdominal MRI  Constipation, last BM was 1 week ago. 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   I have personally reviewed the patient's MRI/MRCP.  Although the cross-sectional imaging has not shown any pancreatic lesion in the last few months, the biliary obstruction/dilation has become more pronounced with elevation in alkaline phosphatase.  As I extensively discussed in my consultation note yesterday, I have concerns that the previous duodenal obstruction is still likely present.  If it is reaching the uncinate process  is not going to be possible.  As well we never were able to fully visualize the major papilla, so access for retrograde cholangiopancreatography will be not possible if that were to still be the case in regards to duodenal  obstruction.  We will attempt EGD/EUS tomorrow (pending anesthesia availability).  If we are able to visualize the pancreas more fully, we will attempt fine-needle biopsy.  If unsuccessful, then continued monitoring of liver biochemical testing until she becomes jaundice may be required in regards to consideration of interventional radiology percutaneous drainage if again we cannot perform a retrograde decompression.  The risks of an EUS including intestinal perforation, bleeding, infection, aspiration, and medication effects were discussed as was the possibility it may not give a definitive diagnosis if a biopsy is performed.  When a biopsy of the pancreas is done as part of the EUS, there is an additional risk of pancreatitis at the rate of about 1-2%.  It was explained that procedure related pancreatitis is typically mild, although it can be severe and even life threatening, which is why we do not perform random pancreatic biopsies and only biopsy a lesion/area we feel is concerning enough to warrant the risk. The risks and benefits of endoscopic evaluation were discussed with the patient; these include but are not limited to the risk of perforation, infection, bleeding, missed lesions, lack of diagnosis, severe illness requiring hospitalization, as well as anesthesia and sedation related illnesses.  The patient and/or family is agreeable to proceed.  All patient questions were answered to the best of my ability, and the patient agrees to the aforementioned plan of action with follow-up as indicated.   I agree with the Advanced Practitioner's note, impression, and recommendations with updates and my documentation as noted above.  The majority of the medical decision making/process, formulation of the impression/plan of action for the patient were performed by me with substantive portion of this encounter (>50% time spent including complete performance of at least one of the key components of MDM, History,  and/or Exam).   Aloha Finner, MD [facility] Advanced [facility]oscopy Office # [phone]

## 2024-03-07 NOTE — Progress Notes (Addendum)
 [facility] Progress Note     Subjective: CC:  Pain controlled. She is about to eat a soft breakfast.  Reports at home she was emptying her eakin pouch 3 times daily, but over the weekend it increased to emptying it more frequently - sometimes its yellow, sometimes it is green. Says when she drinks fruit punch the drainage is red.   Objective: Vital signs in last 24 hours: Temp:  [97.3 F (36.3 C)-98.9 F (37.2 C)] 98.1 F (36.7 C) (09/17 0750) Pulse Rate:  [67-78] 67 (09/17 0750) Resp:  [16-18] 16 (09/17 0750) BP: (89-101)/(56-65) 89/57 (09/17 0750) SpO2:  [95 %-100 %] 100 % (09/17 0750) Weight:  [75.5 kg] 75.5 kg (09/17 0529) Last BM Date :  (PTA)  Intake/Output from previous day: 09/16 0701 - 09/17 0700 In: 600 [P.O.:590; I.V.:10] Out: 60 [Drains:60] Intake/Output this shift: No intake/output data recorded.  PE: Gen:  Alert, NAD, pleasant Card:  Regular rate and rhythm Pulm:  Normal effor Abd: Soft, non-tender, non-distended  G tube c/d/I, clamped   J tube site with eakins pouch with ~ 10mL yellow drainage  No abdominal wall cellulitis or warmth. Skin: warm and dry, no rashes  Psych: A&Ox3   Lab Results:  Recent Labs    03/06/24 1322 03/07/24 0540  WBC 3.4* 3.6*  HGB 7.2* 6.8*  HCT 23.1* 22.1*  PLT 124* 107*   BMET Recent Labs    03/05/24 1210 03/06/24 1000  NA 137 141  K 3.8 4.1  CL 98 104  CO2 26 26  GLUCOSE 100* 114*  BUN 15 10  CREATININE 0.72 0.86  CALCIUM  8.9 8.8*   PT/INR No results for input(s): LABPROT, INR in the last 72 hours. CMP     Component Value Date/Time   NA 141 03/06/2024 1000   NA 144 11/30/2023 1000   K 4.1 03/06/2024 1000   CL 104 03/06/2024 1000   CO2 26 03/06/2024 1000   GLUCOSE 114 (H) 03/06/2024 1000   BUN 10 03/06/2024 1000   BUN 19 11/30/2023 1000   CREATININE 0.86 03/06/2024 1000   CREATININE 0.51 02/27/2024 1531   CALCIUM  8.8 (L) 03/06/2024 1000   PROT 5.6 (L) 03/06/2024 1000   PROT 7.3  11/30/2023 1000   ALBUMIN  3.0 (L) 03/06/2024 1000   ALBUMIN  4.5 11/30/2023 1000   AST 70 (H) 03/06/2024 1000   AST 14 (L) 02/27/2024 1531   ALT 52 (H) 03/06/2024 1000   ALT 35 02/27/2024 1531   ALKPHOS 1,138 (H) 03/06/2024 1000   BILITOT 0.3 03/06/2024 1000   BILITOT 0.7 02/27/2024 1531   GFRNONAA >60 03/06/2024 1000   GFRNONAA >60 02/27/2024 1531   GFRAA >60 09/07/2019 1340   Lipase     Component Value Date/Time   LIPASE 133 (H) 01/19/2024 1858       Studies/Results: No results found.   Anti-infectives: Anti-infectives (From admission, onward)    Start     Dose/Rate Route Frequency Ordered Stop   03/05/24 0800  piperacillin -tazobactam (ZOSYN ) IVPB 3.375 g  Status:  Discontinued        3.375 g 12.5 mL/hr over 240 Minutes Intravenous Every 8 hours 03/05/24 0710 03/07/24 0737   03/05/24 0245  cefTRIAXone  (ROCEPHIN ) 1 g in sodium chloride  0.9 % 100 mL IVPB  Status:  Discontinued        1 g 200 mL/hr over 30 Minutes Intravenous  Once 03/05/24 0237 03/05/24 0241   03/05/24 0245  metroNIDAZOLE  (FLAGYL ) IVPB 500 mg  500 mg 100 mL/hr over 60 Minutes Intravenous  Once 03/05/24 0237 03/05/24 0554   03/05/24 0245  cefTRIAXone  (ROCEPHIN ) 2 g in sodium chloride  0.9 % 100 mL IVPB        2 g 200 mL/hr over 30 Minutes Intravenous  Once 03/05/24 0242 03/05/24 0452        Assessment/Plan  Ms. 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 Lyndel and subsequently underwent G tube and J tube placement 01/10/24 when GJ was not draining who was readmitted 8/14 for FTT and G and J tube malfunction/malposition. G tube replaced by IR 8/15. J tube ultimately removed due to obstruction. Eakins pouch in place, low output. She now presents with vomiting and LUQ intra-abdominal/abdominal wall collection.  - CT on admission with LUQ abdominal/abdominal  wall collection 1.4x 1.8 cm (Smaller compared to prior imaging) near J tube site. There is no external evidence of infection/cellulitis. No large intra-abdominal fluid collections to suggests leak from jejunum from history of J tube. No systemic evidence of sepsis. Suspect this area is just old J-tube tract and not an infection/abscess. Agree with continuing to treat this area like a fistula from jejunum to skin using an eakins pouch. Strict I&Os. Fistula low output at present (60 mL/24h). Continue regular or soft diet unless fistula output increases. Discussed with Dr. Sheldon - stop abx.  - CT on admission w/ progressive peritoneal carcinomatosis (Peritoneal bx from 8/15 was consistent with metastatic carcinoma, breast primary) - scheduled to start chemo this week for known metastatic breast CA, per chart review; Dr. Odean; oncology has been notified of her admission and plans to see her, delaying chemo until next week per patient - increasing biliary dilation on CT at the level of pancreatic lesion with rising alk phos >1,000. GI is consulted and planning MRCP today, possible EUS. Previously unable to access  biliary system due to severe duodenal stenosis. Patient is not a candidate for surgical management of possible primary pancreatic malignancy in the setting of her metastatic breast cancer with carcinomatosis.  - last palliative consult that I see is 8/18 - Full code/ Full scope, consider re-engaging palliative.  - no acute surgical needs, we will follow peripherally.   FEN - was advanced to soft diet; monitor  VTE - SCDs, sqh ID - None currently.    LOS: 2 days   I reviewed nursing notes, hospitalist notes, last 24 h vitals and pain scores, last 48 h intake and output, last 24 h labs and trends, and last 24 h imaging results.  This care required moderate level of medical decision making.   Almarie Pringle, PA-C [facility] Please see Amion for pager number during day hours  7:00am-4:30pm

## 2024-03-07 NOTE — Progress Notes (Signed)
 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  She was recently admitted with low hemoglobin and found to have abdominal fluid collection concerning for abscess however she left AMA and went home.  Came back to the emergency room on surgeons and oncologist advised.  Patient has persistent pain in the epigastric area and G-tube area along with nausea and vomiting.  At the emergency room hemodynamically stable.  Hemoglobin 7.5.  Potassium 2.7.  CT scan consistent with abdominal wall abscess.  Admitted with IV antibiotics.  Surgery consulted.  Subjective: Patient seen and examined in the morning rounds.  She had some pain and nausea overnight.  She was about to go to MRI and needed some IV Ativan .  Patient tells me whether she is going home.  She is fixed on going home.  We discussed about GI team following her MRI and possibly doing a procedure if that is helpful.  Patient is agreeable. Later on, came back from MRI, wants to try regular food.  Assessment & Plan:   Abdominal wall abscess with inflammation, presence of indwelling J-tube.  Persistent drainage from the previous G-tube site. This was thought to be fluid collection.  Unlikely an abscess.  Initially treated with IV antibiotics.  Now discontinued. Advance to regular diet.  Adequate pain medications.  Nausea medications.  Severe hypokalemia: Due to inadequate oral intake.  Potassium replaced.  Adequate today.  Breast cancer with metastasis: Pancreatic lesion with peritoneal carcinomatosis.  She is scheduled to start on chemotherapy this week.  Hopefully her infection gets controlled and she can  start chemotherapy. Challenging treatment and outcome.  Oncology and palliative following. Not sure how much oral intake we will be able to establish on her.  Anemia of chronic disease: Repeat hemoglobin 6.8.  1 unit PRBC transfusion today.     DVT prophylaxis: SCDs Start: 03/05/24 9380   Code Status: Full code Family Communication: None at the bedside Disposition Plan: Status is: Inpatient Remains inpatient appropriate because: Significant symptoms, antibiotics and IV pain medications     Consultants:  General Surgery Gastroenterology  Procedures:  None  Antimicrobials:  Zosyn  9/14-9/17     Objective: Vitals:   03/07/24 0750 03/07/24 0750 03/07/24 0959 03/07/24 0959  BP: (!) 89/57 (!) 89/57 104/70 104/70  Pulse: 67 67 70 70  Resp: 16 16 18 18   Temp: 98.1 F (36.7 C) 98.1 F (36.7 C) 97.6 F (36.4 C) 97.6 F (36.4 C)  TempSrc:  Oral Oral Oral  SpO2:  100% 100% 100%  Weight:      Height:        Intake/Output Summary (Last 24 hours) at 03/07/2024 1144 Last data filed at 03/07/2024 0959 Gross per 24 hour  Intake 565 ml  Output 60 ml  Net 505 ml   Filed Weights   03/04/24 2053 03/07/24 0529  Weight: 72.6 kg 75.5 kg    Examination:  General exam: Appears calm and comfortable.  On room air.  Slightly anxious. Respiratory system: Clear to auscultation. Respiratory effort normal. Cardiovascular system: S1 & S2 heard, RRR. No JVD, murmurs, rubs, gallops or clicks. No pedal edema. Gastrointestinal system: Abdomen soft.  Mild tenderness along the epigastrium and  left lower quadrant. She has a midline J-tube with some drainage around it.  Not used. Patient has pouch fitted on the left lateral quadrant with minimal cloudy drainage. Central nervous system: Alert and oriented. No focal neurological deficits. Extremities: Symmetric 5 x 5 power. Skin: No rashes, lesions or ulcers    Data Reviewed: I have personally reviewed following labs and imaging  studies  CBC: Recent Labs  Lab 03/04/24 2305 03/04/24 2316 03/05/24 1210 03/06/24 1322 03/07/24 0540  WBC 3.2*  --  2.9* 3.4* 3.6*  NEUTROABS 1.5*  --  1.4* 1.6* 1.7  HGB 7.6* 7.5* 7.0* 7.2* 6.8*  HCT 23.7* 22.0* 22.4* 23.1* 22.1*  MCV 80.6  --  81.5 81.9 82.8  PLT 138*  --  138* 124* 107*   Basic Metabolic Panel: Recent Labs  Lab 03/04/24 2305 03/04/24 2316 03/05/24 1210 03/06/24 1000  NA 137 136 137 141  K 2.7* 2.7* 3.8 4.1  CL 96* 93* 98 104  CO2 28  --  26 26  GLUCOSE 108* 110* 100* 114*  BUN 18 18 15 10   CREATININE 0.92 1.10* 0.72 0.86  CALCIUM  8.9  --  8.9 8.8*  MG  --   --  2.1 2.0  PHOS  --   --   --  3.6   GFR: Estimated Creatinine Clearance: 86.5 mL/min (by C-G formula based on SCr of 0.86 mg/dL). Liver Function Tests: Recent Labs  Lab 03/04/24 2305 03/06/24 1000  AST 47* 70*  ALT 45* 52*  ALKPHOS 1,109* 1,138*  BILITOT 0.4 0.3  PROT 6.1* 5.6*  ALBUMIN  3.3* 3.0*   No results for input(s): LIPASE, AMYLASE in the last 168 hours. No results for input(s): AMMONIA in the last 168 hours. Coagulation Profile: No results for input(s): INR, PROTIME in the last 168 hours.  Cardiac Enzymes: No results for input(s): CKTOTAL, CKMB, CKMBINDEX, TROPONINI in the last 168 hours. BNP (last 3 results) No results for input(s): PROBNP in the last 8760 hours. HbA1C: No results for input(s): HGBA1C in the last 72 hours. CBG: No results for input(s): GLUCAP in the last 168 hours. Lipid Profile: No results for input(s): CHOL, HDL, LDLCALC, TRIG, CHOLHDL, LDLDIRECT in the last 72 hours. Thyroid  Function Tests: No results for input(s): TSH, T4TOTAL, FREET4, T3FREE, THYROIDAB in the last 72 hours. Anemia Panel: No results for input(s): VITAMINB12, FOLATE, FERRITIN, TIBC, IRON, RETICCTPCT in the last 72 hours. Sepsis Labs: Recent Labs  Lab 03/04/24 2317 03/05/24 0138  LATICACIDVEN 1.1 0.8    Recent  Results (from the past 240 hours)  Blood Culture (routine x 2)     Status: None   Collection Time: 02/27/24  6:44 PM   Specimen: BLOOD  Result Value Ref Range Status   Specimen Description   Final    BLOOD SITE NOT SPECIFIED Performed at [facility], [address] [place], [place] [address]    Special Requests   Final    Blood Culture results may not be optimal due to an inadequate volume of blood received in culture bottles BOTTLES DRAWN AEROBIC AND ANAEROBIC Performed at [facility], [address] [place], [place] [address]    Culture   Final    NO GROWTH 5 DAYS Performed at [facility], [address] [place], [place] [address]    Report Status 03/03/2024 FINAL  Final  Blood Culture (routine x 2)     Status: None   Collection Time: 02/27/24  6:45 PM  Specimen: BLOOD  Result Value Ref Range Status   Specimen Description   Final    BLOOD PORTA CATH Performed at [facility], [address] [place], [place] [address]    Special Requests   Final    Blood Culture adequate volume BOTTLES DRAWN AEROBIC AND ANAEROBIC Performed at [facility], [address], [place], [place] [address]    Culture   Final    NO GROWTH 5 DAYS Performed at [facility], [address], [place], [place] [address]    Report Status 03/03/2024 FINAL  Final  Resp panel by RT-PCR (RSV, Flu A&B, Covid) Anterior Nasal Swab     Status: None   Collection Time: 02/27/24  7:20 PM   Specimen: Anterior Nasal Swab  Result Value Ref Range Status   SARS Coronavirus 2 by RT PCR NEGATIVE NEGATIVE Final    Comment: (NOTE) SARS-CoV-2 target nucleic acids are NOT DETECTED.  The SARS-CoV-2 RNA is generally detectable in upper respiratory specimens during the acute phase of infection. The lowest concentration of SARS-CoV-2 viral copies this assay can detect is 138 copies/mL. A negative result does not preclude  SARS-Cov-2 infection and should not be used as the sole basis for treatment or other patient management decisions. A negative result may occur with  improper specimen collection/handling, submission of specimen other than nasopharyngeal swab, presence of viral mutation(s) within the areas targeted by this assay, and inadequate number of viral copies(<138 copies/mL). A negative result must be combined with clinical observations, patient history, and epidemiological information. The expected result is Negative.  Fact Sheet for Patients:  BloggerCourse.com  Fact Sheet for Healthcare Providers:  SeriousBroker.it  This test is no t yet approved or cleared by the [place]  FDA and  has been authorized for detection and/or diagnosis of SARS-CoV-2 by FDA under an Emergency Use Authorization (EUA). This EUA will remain  in effect (meaning this test can be used) for the duration of the COVID-19 declaration under Section 564(b)(1) of the Act, 21 U.S.C.section 360bbb-3(b)(1), unless the authorization is terminated  or revoked sooner.       Influenza A by PCR NEGATIVE NEGATIVE Final   Influenza B by PCR NEGATIVE NEGATIVE Final    Comment: (NOTE) The Xpert Xpress SARS-CoV-2/FLU/RSV plus assay is intended as an aid in the diagnosis of influenza from Nasopharyngeal swab specimens and should not be used as a sole basis for treatment. Nasal washings and aspirates are unacceptable for Xpert Xpress SARS-CoV-2/FLU/RSV testing.  Fact Sheet for Patients: BloggerCourse.com  Fact Sheet for Healthcare Providers: SeriousBroker.it  This test is not yet approved or cleared by the [place]  FDA and has been authorized for detection and/or diagnosis of SARS-CoV-2 by FDA under an Emergency Use Authorization (EUA). This EUA will remain in effect (meaning this test can be used) for the duration of  the COVID-19 declaration under Section 564(b)(1) of the Act, 21 U.S.C. section 360bbb-3(b)(1), unless the authorization is terminated or revoked.     Resp Syncytial Virus by PCR NEGATIVE NEGATIVE Final    Comment: (NOTE) Fact Sheet for Patients: BloggerCourse.com  Fact Sheet for Healthcare Providers: SeriousBroker.it  This test is not yet approved or cleared by the [place]  FDA and has been authorized for detection and/or diagnosis of SARS-CoV-2 by FDA under an Emergency Use Authorization (EUA). This EUA will remain in effect (meaning this test can be used) for the duration of the COVID-19 declaration under Section 564(b)(1) of the Act, 21 U.S.C.  section 360bbb-3(b)(1), unless the authorization is terminated or revoked.  Performed at [facility], [address] [place], [place] [address]          Radiology Studies: No results found.       Scheduled Meds:  Chlorhexidine  Gluconate Cloth  6 each Topical Daily   feeding supplement  1 Container Oral TID BM   fentaNYL   1 patch Transdermal Q72H   sodium chloride  flush  10-40 mL Intracatheter Q12H   Continuous Infusions:  promethazine  (PHENERGAN ) injection (IM or IVPB) 12.5 mg (03/06/24 1834)     LOS: 2 days    Time spent: 35 minutes.    Renato Applebaum, MD [facility]

## 2024-03-07 NOTE — Progress Notes (Signed)
 Chaplain attempted visit with pt Chelsea Hansen per Lead Chaplain referral, however she was unavailable. NP was talking with her at the time. Chaplain will follow up.

## 2024-03-07 NOTE — Plan of Care (Signed)
  Problem: Education: Goal: Knowledge of General Education information will improve Description: Including pain rating scale, medication(s)/side effects and non-pharmacologic comfort measures Outcome: Progressing   Problem: Health Behavior/Discharge Planning: Goal: Ability to manage health-related needs will improve Outcome: Progressing   Problem: Clinical Measurements: Goal: Ability to maintain clinical measurements within normal limits will improve Outcome: Progressing Goal: Will remain free from infection Outcome: Progressing Goal: Respiratory complications will improve Outcome: Progressing Goal: Cardiovascular complication will be avoided Outcome: Progressing   Problem: Activity: Goal: Risk for activity intolerance will decrease Outcome: Progressing   Problem: Nutrition: Goal: Adequate nutrition will be maintained Outcome: Progressing   Problem: Elimination: Goal: Will not experience complications related to bowel motility Outcome: Progressing Goal: Will not experience complications related to urinary retention Outcome: Progressing   Problem: Pain Managment: Goal: General experience of comfort will improve and/or be controlled Outcome: Progressing   Problem: Safety: Goal: Ability to remain free from injury will improve Outcome: Progressing   Problem: Skin Integrity: Goal: Risk for impaired skin integrity will decrease Outcome: Progressing

## 2024-03-07 NOTE — Plan of Care (Addendum)
 Patient went downstairs for MRI. MRI staff had to remove Fentanyl  patch and patient requesting a new one. Pharmacy and MD notified. 1145: New patch ordered and administered to patients left arm.  Problem: Education: Goal: Knowledge of General Education information will improve Description: Including pain rating scale, medication(s)/side effects and non-pharmacologic comfort measures Outcome: Progressing   Problem: Health Behavior/Discharge Planning: Goal: Ability to manage health-related needs will improve Outcome: Progressing   Problem: Clinical Measurements: Goal: Respiratory complications will improve Outcome: Progressing Goal: Cardiovascular complication will be avoided Outcome: Progressing   Problem: Activity: Goal: Risk for activity intolerance will decrease Outcome: Progressing   Problem: Coping: Goal: Level of anxiety will decrease Outcome: Progressing

## 2024-03-07 NOTE — Progress Notes (Signed)
   Daily Progress Note   Patient Name: Chelsea Hansen       Date: 03/07/2024 DOB: [birth year]  Age: 53 y.o. MRN#: [id number] Attending Physician: Raenelle Coria, MD Primary Care Physician: [name], NP Admit Date: 03/04/2024  Reason for Consultation/Follow-up: Pain control  Subjective: Chart Reviewed. Updates Received. Patient Assessed. Patient going down for MRI. Updates received from nurse. Pain is somewhat improved today post Fentanyl  patch. Patient continues to report breakthrough pain. Persistent nausea requiring antiemetics. Over past 24 hours patient has received 6mg  IV hydromorphone .   She is on strict I&O and caloric count to monitor nutritional intake and Eakins pouch output. Tolerate some nutrition on yesterday however continues to have nausea. Antiemetics are effective. We will continue with current pain regimen for now and re-evaluate tomorrow for adjustments.    Length of Stay: 2 days  Vital Signs: BP 104/70 (BP Location: Left Arm)   Pulse 70   Temp 97.6 F (36.4 C) (Oral)   Resp 18   Ht 5' 11 (1.803 m)   Wt 75.5 kg   SpO2 100%   BMI 23.21 kg/m  SpO2: SpO2: 100 % O2 Device: O2 Device: Room Air O2 Flow Rate:   Last Weight  Most recent update: 03/07/2024  7:16 AM    Weight  75.5 kg (166 lb 7.2 oz)             Intake/Output Summary (Last 24 hours) at 03/07/2024 1156 Last data filed at 03/07/2024 0959 Gross per 24 hour  Intake 565 ml  Output 60 ml  Net 505 ml    Palliative Care Assessment & Plan   Code Status: Full code   Recommendations/Plan:  Continue with current plan of care per medical team  PMT will continue to support and follow.  Please secure chat for urgent needs.   Symptom Management: Pain Fentanyl  12mcg patch (will consider increase over next 24 hours pending medication use and MME).  Continue hydromorphone  1mg  every 3 hours. (6 doses over past 24 hours) Will plan to restart Oxycodone  once nausea resolves and cleared by  medical team Nausea Zofran  4mg  every 6 hours as needed  Promethazine  12.5 mg every 6 hours as needed   Thank you for allowing the Palliative Medicine Team to assist in the care of this patient.  Palliative Medicine Team providers are available by phone from 7am to 7pm daily and can be reached through the team cell phone. Should this patient require assistance outside of these hours, please call the patient's attending physician.  Any controlled substances utilized were prescribed in the context of palliative care. PDMP has been reviewed.  Visit consisted of counseling and education dealing with the complex and emotionally intense issues of symptom management and palliative care in the setting of serious and potentially life-threatening illness.  Levon Borer, AGPCNP-BC  [facility]  [phone]  *Please note that this is a verbal dictation therefore any spelling or grammatical errors are due to the Dragon Medical One system interpretation.

## 2024-03-07 NOTE — Progress Notes (Signed)
 I attempted to meet with Chelsea Hansen to offer support, but she was asleep at time of visit.  I will attempt again tomorrow.

## 2024-03-08 ENCOUNTER — Inpatient Hospital Stay (HOSPITAL_COMMUNITY): Payer: MEDICAID | Admitting: Anesthesiology

## 2024-03-08 ENCOUNTER — Encounter (HOSPITAL_COMMUNITY): Payer: Self-pay | Admitting: Internal Medicine

## 2024-03-08 ENCOUNTER — Encounter (HOSPITAL_COMMUNITY)
Admission: EM | Disposition: A | Discharge status: Home / Self Care | Payer: Self-pay | Source: Home / Self Care | Attending: Internal Medicine

## 2024-03-08 DIAGNOSIS — K315 Obstruction of duodenum: Secondary | ICD-10-CM | POA: Diagnosis not present

## 2024-03-08 DIAGNOSIS — Z515 Encounter for palliative care: Secondary | ICD-10-CM | POA: Diagnosis not present

## 2024-03-08 DIAGNOSIS — I10 Essential (primary) hypertension: Secondary | ICD-10-CM

## 2024-03-08 DIAGNOSIS — Z980 Intestinal bypass and anastomosis status: Secondary | ICD-10-CM

## 2024-03-08 DIAGNOSIS — F418 Other specified anxiety disorders: Secondary | ICD-10-CM | POA: Diagnosis not present

## 2024-03-08 DIAGNOSIS — T182XXA Foreign body in stomach, initial encounter: Secondary | ICD-10-CM | POA: Diagnosis not present

## 2024-03-08 DIAGNOSIS — R188 Other ascites: Secondary | ICD-10-CM

## 2024-03-08 DIAGNOSIS — R110 Nausea: Secondary | ICD-10-CM | POA: Diagnosis not present

## 2024-03-08 DIAGNOSIS — K209 Esophagitis, unspecified without bleeding: Secondary | ICD-10-CM | POA: Diagnosis not present

## 2024-03-08 DIAGNOSIS — K8689 Other specified diseases of pancreas: Secondary | ICD-10-CM | POA: Diagnosis not present

## 2024-03-08 DIAGNOSIS — Z931 Gastrostomy status: Secondary | ICD-10-CM

## 2024-03-08 DIAGNOSIS — G893 Neoplasm related pain (acute) (chronic): Secondary | ICD-10-CM | POA: Diagnosis not present

## 2024-03-08 DIAGNOSIS — L02211 Cutaneous abscess of abdominal wall: Secondary | ICD-10-CM | POA: Diagnosis not present

## 2024-03-08 HISTORY — PX: EUS: SHX5427

## 2024-03-08 HISTORY — PX: ESOPHAGOGASTRODUODENOSCOPY: SHX5428

## 2024-03-08 LAB — BPAM RBC
Blood Product Expiration Date: 202510092359
ISSUE DATE / TIME: 202509170730
Unit Type and Rh: 7300

## 2024-03-08 LAB — TYPE AND SCREEN
ABO/RH(D): B POS
Antibody Screen: NEGATIVE
Unit division: 0

## 2024-03-08 LAB — HEPATIC FUNCTION PANEL
ALT: 50 U/L — ABNORMAL HIGH (ref 0–44)
AST: 46 U/L — ABNORMAL HIGH (ref 15–41)
Albumin: 2.9 g/dL — ABNORMAL LOW (ref 3.5–5.0)
Alkaline Phosphatase: 1235 U/L — ABNORMAL HIGH (ref 38–126)
Bilirubin, Direct: 0.2 mg/dL (ref 0.0–0.2)
Indirect Bilirubin: 0.1 mg/dL — ABNORMAL LOW (ref 0.3–0.9)
Total Bilirubin: 0.3 mg/dL (ref 0.0–1.2)
Total Protein: 5.4 g/dL — ABNORMAL LOW (ref 6.5–8.1)

## 2024-03-08 LAB — BASIC METABOLIC PANEL WITH GFR
Anion gap: 11 (ref 5–15)
BUN: 11 mg/dL (ref 6–20)
CO2: 26 mmol/L (ref 22–32)
Calcium: 9 mg/dL (ref 8.9–10.3)
Chloride: 102 mmol/L (ref 98–111)
Creatinine, Ser: 0.59 mg/dL (ref 0.44–1.00)
GFR, Estimated: >60 mL/min (ref >60)
Glucose, Bld: 90 mg/dL (ref 70–99)
Potassium: 4.1 mmol/L (ref 3.5–5.1)
Sodium: 139 mmol/L (ref 135–145)

## 2024-03-08 SURGERY — ULTRASOUND, UPPER GI TRACT, ENDOSCOPIC
Anesthesia: Monitor Anesthesia Care

## 2024-03-08 MED ORDER — FENTANYL 25 MCG/HR TD PT72
1.0000 | MEDICATED_PATCH | TRANSDERMAL | Status: DC
  Administered 2024-03-08: 1 via TRANSDERMAL
  Filled 2024-03-08: qty 1

## 2024-03-08 MED ORDER — PROPOFOL 500 MG/50ML IV EMUL
INTRAVENOUS | Status: DC | PRN
  Administered 2024-03-08: 150 ug/kg/min via INTRAVENOUS

## 2024-03-08 MED ORDER — PHENYLEPHRINE HCL (PRESSORS) 10 MG/ML IV SOLN
INTRAVENOUS | Status: DC | PRN
Start: 2024-03-08 — End: 2024-03-08
  Administered 2024-03-08: 160 ug via INTRAVENOUS
  Administered 2024-03-08: 100 ug via INTRAVENOUS

## 2024-03-08 MED ORDER — SODIUM CHLORIDE 0.9 % IV SOLN
INTRAVENOUS | Status: AC | PRN
  Administered 2024-03-08: 500 mL via INTRAVENOUS

## 2024-03-08 MED ORDER — LIDOCAINE HCL (PF) 2 % IJ SOLN
INTRAMUSCULAR | Status: DC | PRN
  Administered 2024-03-08: 100 mg via INTRADERMAL

## 2024-03-08 MED ORDER — PROPOFOL 1000 MG/100ML IV EMUL
INTRAVENOUS | Status: AC
  Filled 2024-03-08: qty 100

## 2024-03-08 MED ORDER — SODIUM CHLORIDE 0.9 % IV SOLN: INTRAVENOUS | Status: DC

## 2024-03-08 MED ORDER — HYDROMORPHONE HCL 1 MG/ML IJ SOLN
INTRAMUSCULAR | Status: AC
  Filled 2024-03-08: qty 1

## 2024-03-08 NOTE — Progress Notes (Signed)
 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  She was recently admitted with low hemoglobin and found to have abdominal fluid collection concerning for abscess however she left AMA and went home.  Came back to the emergency room on surgeons and oncologist advised.  Patient has persistent pain in the epigastric area and G-tube area along with nausea and vomiting.  At the emergency room hemodynamically stable.  Hemoglobin 7.5.  Potassium 2.7.  CT scan consistent with abdominal wall abscess.  Admitted with IV antibiotics.  Surgery consulted.  Subjective: Patient seen and examined in the morning rounds.  She is scheduled for endoscopy.  She tells me that she is ready to eat and go home.  She has been asking for pain medication nausea medication at night.  She is very focused on going home.  We discussed about procedure today, and if she is able to eat well overnight to go home tomorrow morning. Patient tells me that she ate salmon last night and it went well.  Assessment & Plan:   Abdominal wall abscess with inflammation, presence of indwelling J-tube.  Persistent drainage from the previous G-tube site.This was thought to be fluid collection.  Unlikely an abscess.  Initially treated with IV antibiotics.  Now discontinued.Advance to regular diet.  Adequate pain medications.  Nausea medications.  Severe hypokalemia: Due to inadequate oral intake.  Potassium replaced.  Adequate today.  Breast cancer with metastasis: Pancreatic lesion with peritoneal carcinomatosis.  She is scheduled to start on chemotherapy this week.  Hopefully her infection gets controlled  and she can start chemotherapy. Challenging treatment and outcome.  Oncology and palliative following. Not sure how much oral intake we will be able to establish on her.  Anemia of chronic disease: Repeat hemoglobin 6.8.  1 unit PRBC-8.5.  Recheck tomorrow morning.  Abdominal pain with biliary obstruction: Patient with normal LFTs. MRCP with ill-defined mass at the region of pancreatic uncinate process and third part of duodenum with dilatation of the common bile duct and pancreatic duct,  peritoneal carcinomatosis For EGD, EUS and biopsy today by GI.    DVT prophylaxis: SCDs Start: 03/05/24 9380   Code Status: Full code Family Communication: None at the bedside Disposition Plan: Status is: Inpatient Remains inpatient appropriate because: Significant symptoms, inpatient procedures and IV pain medications     Consultants:  General Surgery Gastroenterology Oncology Palliative  Procedures:  Upper GI endoscopy, planned today.  Antimicrobials:  Zosyn  9/14-9/17     Objective: Vitals:   03/07/24 1345 03/07/24 2129 03/08/24 0542 03/08/24 1158  BP: 97/62 98/64 101/68 114/72  Pulse: 80 77 85   Resp: 18 18 18 16   Temp: 97.7 F (36.5 C) 98.2 F (36.8 C) 98.3 F (36.8 C) (!) 97.5 F (36.4 C)  TempSrc: Oral  Oral Temporal  SpO2: 99% 100% 98% 96%  Weight:      Height:        Intake/Output Summary (Last 24 hours) at 03/08/2024 1348 Last data filed at 03/08/2024 1340 Gross per 24 hour  Intake 310 ml  Output 50 ml  Net 260 ml   Filed Weights   03/04/24 2053 03/07/24 0529  Weight: 72.6 kg 75.5 kg  Examination:  General exam: Looks fairly comfortable resting. Respiratory system: Clear to auscultation. Respiratory effort normal. Cardiovascular system: S1 & S2 heard, RRR. No JVD, murmurs, rubs, gallops or clicks. No pedal edema. Gastrointestinal system: Abdomen soft.  Mild tenderness along the epigastrium and left lower quadrant. She has a midline J-tube with some  drainage around it.  Not used. Patient has pouch fitted on the left lateral quadrant with minimal cloudy drainage. Central nervous system: Alert and oriented. No focal neurological deficits. Extremities: Symmetric 5 x 5 power. Skin: No rashes, lesions or ulcers    Data Reviewed: I have personally reviewed following labs and imaging studies  CBC: Recent Labs  Lab 03/04/24 2305 03/04/24 2316 03/05/24 1210 03/06/24 1322 03/07/24 0540 03/07/24 2245  WBC 3.2*  --  2.9* 3.4* 3.6* 4.4  NEUTROABS 1.5*  --  1.4* 1.6* 1.7  --   HGB 7.6* 7.5* 7.0* 7.2* 6.8* 8.5*  HCT 23.7* 22.0* 22.4* 23.1* 22.1* 27.3*  MCV 80.6  --  81.5 81.9 82.8 82.0  PLT 138*  --  138* 124* 107* 107*   Basic Metabolic Panel: Recent Labs  Lab 03/04/24 2305 03/04/24 2316 03/05/24 1210 03/06/24 1000 03/08/24 0606  NA 137 136 137 141 139  K 2.7* 2.7* 3.8 4.1 4.1  CL 96* 93* 98 104 102  CO2 28  --  26 26 26   GLUCOSE 108* 110* 100* 114* 90  BUN 18 18 15 10 11   CREATININE 0.92 1.10* 0.72 0.86 0.59  CALCIUM  8.9  --  8.9 8.8* 9.0  MG  --   --  2.1 2.0  --   PHOS  --   --   --  3.6  --    GFR: Estimated Creatinine Clearance: 93 mL/min (by C-G formula based on SCr of 0.59 mg/dL). Liver Function Tests: Recent Labs  Lab 03/04/24 2305 03/06/24 1000 03/08/24 0606  AST 47* 70* 46*  ALT 45* 52* 50*  ALKPHOS 1,109* 1,138* 1,235*  BILITOT 0.4 0.3 0.3  PROT 6.1* 5.6* 5.4*  ALBUMIN  3.3* 3.0* 2.9*   No results for input(s): LIPASE, AMYLASE in the last 168 hours. No results for input(s): AMMONIA in the last 168 hours. Coagulation Profile: No results for input(s): INR, PROTIME in the last 168 hours.  Cardiac Enzymes: No results for input(s): CKTOTAL, CKMB, CKMBINDEX, TROPONINI in the last 168 hours. BNP (last 3 results) No results for input(s): PROBNP in the last 8760 hours. HbA1C: No results for input(s): HGBA1C in the last 72 hours. CBG: No results for input(s): GLUCAP in the last  168 hours. Lipid Profile: No results for input(s): CHOL, HDL, LDLCALC, TRIG, CHOLHDL, LDLDIRECT in the last 72 hours. Thyroid  Function Tests: No results for input(s): TSH, T4TOTAL, FREET4, T3FREE, THYROIDAB in the last 72 hours. Anemia Panel: No results for input(s): VITAMINB12, FOLATE, FERRITIN, TIBC, IRON, RETICCTPCT in the last 72 hours. Sepsis Labs: Recent Labs  Lab 03/04/24 2317 03/05/24 0138  LATICACIDVEN 1.1 0.8    Recent Results (from the past 240 hours)  Blood Culture (routine x 2)     Status: None   Collection Time: 02/27/24  6:44 PM   Specimen: BLOOD  Result Value Ref Range Status   Specimen Description   Final    BLOOD SITE NOT SPECIFIED Performed at [facility], [address] [place], [place] [address]    Special Requests   Final    Blood Culture results may not be optimal due to an inadequate volume of blood received  in culture bottles BOTTLES DRAWN AEROBIC AND ANAEROBIC Performed at [facility], [address] [place], [place] [address]    Culture   Final    NO GROWTH 5 DAYS Performed at [facility], [address], [place], [place] [address]    Report Status 03/03/2024 FINAL  Final  Blood Culture (routine x 2)     Status: None   Collection Time: 02/27/24  6:45 PM   Specimen: BLOOD  Result Value Ref Range Status   Specimen Description   Final    BLOOD PORTA CATH Performed at [facility], [address] [place], [place] [address]    Special Requests   Final    Blood Culture adequate volume BOTTLES DRAWN AEROBIC AND ANAEROBIC Performed at [facility], [address], [place], [place] [address]    Culture   Final    NO GROWTH 5 DAYS Performed at [facility], [address], [place], [place] [address]    Report Status 03/03/2024 FINAL  Final  Resp panel by RT-PCR (RSV, Flu A&B, Covid) Anterior Nasal Swab     Status: None    Collection Time: 02/27/24  7:20 PM   Specimen: Anterior Nasal Swab  Result Value Ref Range Status   SARS Coronavirus 2 by RT PCR NEGATIVE NEGATIVE Final    Comment: (NOTE) SARS-CoV-2 target nucleic acids are NOT DETECTED.  The SARS-CoV-2 RNA is generally detectable in upper respiratory specimens during the acute phase of infection. The lowest concentration of SARS-CoV-2 viral copies this assay can detect is 138 copies/mL. A negative result does not preclude SARS-Cov-2 infection and should not be used as the sole basis for treatment or other patient management decisions. A negative result may occur with  improper specimen collection/handling, submission of specimen other than nasopharyngeal swab, presence of viral mutation(s) within the areas targeted by this assay, and inadequate number of viral copies(<138 copies/mL). A negative result must be combined with clinical observations, patient history, and epidemiological information. The expected result is Negative.  Fact Sheet for Patients:  BloggerCourse.com  Fact Sheet for Healthcare Providers:  SeriousBroker.it  This test is no t yet approved or cleared by the [place]  FDA and  has been authorized for detection and/or diagnosis of SARS-CoV-2 by FDA under an Emergency Use Authorization (EUA). This EUA will remain  in effect (meaning this test can be used) for the duration of the COVID-19 declaration under Section 564(b)(1) of the Act, 21 U.S.C.section 360bbb-3(b)(1), unless the authorization is terminated  or revoked sooner.       Influenza A by PCR NEGATIVE NEGATIVE Final   Influenza B by PCR NEGATIVE NEGATIVE Final    Comment: (NOTE) The Xpert Xpress SARS-CoV-2/FLU/RSV plus assay is intended as an aid in the diagnosis of influenza from Nasopharyngeal swab specimens and should not be used as a sole basis for treatment. Nasal washings and aspirates are unacceptable for  Xpert Xpress SARS-CoV-2/FLU/RSV testing.  Fact Sheet for Patients: BloggerCourse.com  Fact Sheet for Healthcare Providers: SeriousBroker.it  This test is not yet approved or cleared by the [place]  FDA and has been authorized for detection and/or diagnosis of SARS-CoV-2 by FDA under an Emergency Use Authorization (EUA). This EUA will remain in effect (meaning this test can be used) for the duration of the COVID-19 declaration under Section 564(b)(1) of the Act, 21 U.S.C. section 360bbb-3(b)(1), unless the authorization is terminated or revoked.     Resp Syncytial Virus by PCR  NEGATIVE NEGATIVE Final    Comment: (NOTE) Fact Sheet for Patients: BloggerCourse.com  Fact Sheet for Healthcare Providers: SeriousBroker.it  This test is not yet approved or cleared by the [place]  FDA and has been authorized for detection and/or diagnosis of SARS-CoV-2 by FDA under an Emergency Use Authorization (EUA). This EUA will remain in effect (meaning this test can be used) for the duration of the COVID-19 declaration under Section 564(b)(1) of the Act, 21 U.S.C. section 360bbb-3(b)(1), unless the authorization is terminated or revoked.  Performed at [facility], [address], [place], [place] [address]          Radiology Studies: MR 3D Recon At Scanner Result Date: 03/07/2024 : CLINICAL DATA: Pancreatic mass, monitor biliary obstruction from pancreatic head lesion, history of metastatic breast cancer EXAM: MRI ABDOMEN WITHOUT AND WITH CONTRAST (INCLUDING MRCP) TECHNIQUE: Multiplanar multisequence MR imaging of the abdomen was performed both before and after the administration of intravenous contrast. Heavily T2-weighted images of the biliary and pancreatic ducts were obtained, and three-dimensional MRCP images were rendered by post processing. CONTRAST: 7mL  GADAVIST  GADOBUTROL  1 MMOL/ML IV SOLN COMPARISON: CT abdomen pelvis March 05, 2024, MR abdomen December 23, 2023 FINDINGS: Lower chest: No pleural effusion. Hepatobiliary/MRCP: Non cirrhotic morphology. Prominent central intrahepatic bile ducts. No focal liver lesion.No significant hepatic steatosis. Gallbladder is normal. Common bile duct is dilated measuring up to 12 mm proximally and tapers to 4 mm at the pancreatic head. Pancreas: At the region of pancreatic uncinate process there is an ill-defined mildly enhancing mass inseparable from the proximal third portion of duodenum measuring 2.4 x 2 cm with upstream dilation of the pancreatic duct up to 4 mm. This lesion was previously measured 2.5 x 2.2 cm on December 23, 2023. The mass is abutting the superior mesenteric vein. SMA is not involved. Spleen: Within normal limits in size and appearance. Adrenals/Urinary Tract: Stable bilateral simple cortical cysts which does not require imaging follow-up. No evidence of hydronephrosis. Stomach/Bowel: Gastrostomy tube is in place. No bowel obstruction. Vascular/Lymphatic: No pathologically enlarged lymph nodes identified. No abdominal aortic aneurysm demonstrated. Other: Enhancing nodule measuring 2.6 x 1.5 cm along the left mesocolon consistent with peritoneal carcinomatosis (21/71), stable to prior. Catheter tract in left lower quadrant ventral abdominal wall. Small intramuscular abscess with enhancing wall is identified within the left anterior abdominal wall measuring 3.9 x 1.4 cm, previously measured 4.2 x 2.2 cm (remeasured in the same manner). 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 Musculoskeletal: Heterogeneous vertebral spine consistent with known osseous metastasisStable compression deformity of L3 vertebral body. IMPRESSION: Ill-defined mass at the region of pancreatic uncinate process/third portion of duodenum with associated upstream  dilation of the common bile duct and pancreatic duct, grossly stable to prior and likely neoplastic. Recommend tissue sampling if not yet performed. Peritoneal carcinomatosis as detailed above, stable to prior. Left anterior abdominal wall abscess, stable. Heterogeneous bone marrow of the vertebral spine and L3 compression deformity consistent with known osseous metastasis from breast primary, better assessed on prior CT. Electronically Signed By: Megan Zare M.D. On: 03/07/2024 12:25 Electronically Signed   By: Duwaine Severs M.D.   On: 03/07/2024 12:29   MR ABDOMEN MRCP W WO CONTAST Result Date: 03/07/2024 CLINICAL DATA:  Pancreatic mass, monitor biliary obstruction from pancreatic head lesion, history of metastatic breast cancer EXAM: MRI ABDOMEN WITHOUT AND WITH CONTRAST (INCLUDING MRCP) TECHNIQUE: Multiplanar multisequence MR imaging of the abdomen was performed both before  and after the administration of intravenous contrast. Heavily T2-weighted images of the biliary and pancreatic ducts were obtained, and three-dimensional MRCP images were rendered by post processing. CONTRAST:  7mL GADAVIST  GADOBUTROL  1 MMOL/ML IV SOLN COMPARISON:  CT abdomen pelvis March 05, 2024, MR abdomen December 23, 2023 FINDINGS: Lower chest: No pleural effusion. Hepatobiliary/MRCP: Non cirrhotic morphology. Prominent central intrahepatic bile ducts. No focal liver lesion.No significant hepatic steatosis. Gallbladder is normal. Common bile duct is dilated measuring up to 12 mm proximally and tapers to 4 mm at the pancreatic head. Pancreas: At the region of pancreatic uncinate process there is an ill-defined mildly enhancing mass inseparable from the proximal third portion of duodenum measuring 2.4 x 2 cm with upstream dilation of the pancreatic duct up to 4 mm. This lesion was previously measured 2.5 x 2.2 cm on December 23, 2023. The mass is abutting the superior mesenteric vein. SMA is not involved. Spleen:  Within normal limits in size  and appearance. Adrenals/Urinary Tract: Stable bilateral simple cortical cysts which does not require imaging follow-up. No evidence of hydronephrosis. Stomach/Bowel: Gastrostomy tube is in place.  No bowel obstruction. Vascular/Lymphatic: No pathologically enlarged lymph nodes identified. No abdominal aortic aneurysm demonstrated. Other: Enhancing nodule measuring 2.6 x 1.5 cm along the left mesocolon consistent with peritoneal carcinomatosis (21/71), stable to prior. Catheter tract in left lower quadrant ventral abdominal wall. Small intramuscular abscess with enhancing wall is identified within the left anterior abdominal wall measuring 3.9 x 1.4 cm, previously measured 4.2 x 2.2 cm (remeasured in the same manner). 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 Musculoskeletal: Heterogeneous vertebral spine consistent with known osseous metastasisStable compression deformity of L3 vertebral body. IMPRESSION: Ill-defined mass at the region of pancreatic uncinate process/third portion of duodenum with associated upstream dilation of the common bile duct and pancreatic duct, grossly stable to prior and likely neoplastic. Recommend tissue sampling if not yet performed. Peritoneal carcinomatosis as detailed above, stable to prior. Left anterior abdominal wall abscess, stable. Heterogeneous bone marrow of the vertebral spine and L3 compression deformity consistent with known osseous metastasis from breast primary, better assessed on prior CT. Electronically Signed   By: Megan  Zare M.D.   On: 03/07/2024 12:25         Scheduled Meds:  [MAR Hold] Chlorhexidine  Gluconate Cloth  6 each Topical Daily   [MAR Hold] fentaNYL   1 patch Transdermal Q72H   [MAR Hold] sodium chloride  flush  10-40 mL Intracatheter Q12H   Continuous Infusions:  sodium chloride      [MAR Hold] promethazine  (PHENERGAN ) injection (IM or IVPB) 12.5 mg (03/07/24 1252)      LOS: 3 days    Time spent: 35 minutes.    Renato Applebaum, MD [facility]

## 2024-03-08 NOTE — Op Note (Signed)
 [facility] Patient Name: Chelsea Hansen Procedure Date: 03/08/2024 MRN: [id number] Attending MD: Aloha Finner , MD, [id number] Date of Birth: 03-Apr-1971 CSN: [id number] Age: 53 Admit Type: Inpatient Procedure:                Upper EUS Indications:              Common bile duct dilation (acquired) seen on MRCP,                            Suspected mass in pancreas on MRCP, Abnormal liver                            function test Providers:                Aloha Finner, MD, Robie Breed, RN,                            Lorrayne Kitty, Technician Referring MD:              Medicines:                Monitored Anesthesia Care Complications:            No immediate complications. Estimated Blood Loss:     Estimated blood loss was minimal. Procedure:                Pre-Anesthesia Assessment:                           - Prior to the procedure, a History and Physical                            was performed, and patient medications and                            allergies were reviewed. The patient's tolerance of                            previous anesthesia was also reviewed. The risks                            and benefits of the procedure and the sedation                            options and risks were discussed with the patient.                            All questions were answered, and informed consent                            was obtained. Prior Anticoagulants: The patient has                            taken no anticoagulant or antiplatelet agents. ASA  Grade Assessment: III - A patient with severe                            systemic disease. After reviewing the risks and                            benefits, the patient was deemed in satisfactory                            condition to undergo the procedure.                           After obtaining informed consent, the endoscope was                            passed under  direct vision. Throughout the                            procedure, the patient's blood pressure, pulse, and                            oxygen saturations were monitored continuously. The                            [id number]-H190 ([id number]) Olympus endoscope was introduced                            through the mouth, and advanced to the duodenal                            bulb. The [id number] ([id number]) Olympus endosonoscope                            was introduced through the mouth, and advanced to                            the duodenum for ultrasound examination from the                            stomach and duodenum. The upper EUS was                            accomplished without difficulty. The patient                            tolerated the procedure. Scope In: Scope Out: Findings:      ENDOSCOPIC FINDING: :      No gross lesions were noted in the proximal esophagus and in the mid       esophagus.      LA Grade B (one or more mucosal breaks greater than 5 mm, not extending       between the tops of two mucosal folds) esophagitis with no bleeding was       found in the distal esophagus.      There  was evidence of a patent gastrostomy with no G-tube present in the       gastric body. This was characterized by healthy appearing mucosa.      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 This was characterized by congestion, erosion, inflammation       and an intact staple line (these congested erosive changes were noted at       areas of the staples). Removal of 7 staples was accomplished with a       regular forceps.      The gastric antrum, prepyloric region of the stomach and pylorus were       normal.      No gross lesions were noted in the duodenal bulb.      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      ENDOSONOGRAPHIC FINDING: :      Pancreatic parenchymal  abnormalities were noted in the pancreatic head.       These consisted of hyperechoic strands.      The pancreatic duct had a dilated endosonographic appearance in the       pancreatic head and genu of the pancreas. The pancreatic duct measured       up to 4.0 mm in diameter.      There was dilation in the common bile duct and in the common hepatic       duct which measured up to 14 mm.      Endosonographic imaging in the visualized portion of the liver showed no       mass.      A moderate amount of fluid, visualized as an anechoic feature, was found       in the peritoneal cavity and in the periduodenal and perihepatic spaces.      The celiac region was visualized.      Unfortunately I cannot position the endoscope to be able to visualize       the major papilla/ampulla/uncinate process of the pancreas. This is a       result of the significant duodenal stenosis in the duodenal sweep/angle. Impression:               EGD impression:                           - No gross lesions in the proximal esophagus and in                            the mid esophagus. LA Grade B esophagitis with no                            bleeding noted distally (this is better than prior).                           - Patent gastrostomy with no G-tube present                            characterized by healthy appearing mucosa.                           -  A gastrojejunostomy was found, characterized by                            inflammation, congestion, erosion and an intact                            staple line (at staple line that was visualized                            there were congested erosive changes which is a                            reason that 7 of the staples were removed).                           - Normal antrum, prepyloric region of the stomach                            and pylorus.                           - No gross lesions in the duodenal bulb.                           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                           EUS impression:                           - Pancreatic parenchymal abnormalities consisting                            of hyperechoic strands were noted in the pancreatic                            head.                           - The pancreatic duct had a dilated endosonographic                            appearance in the pancreatic head and genu of the                            pancreas. The pancreatic duct measured up to 4.0 mm                            in diameter in the  head and neck.                           - There was dilation in the common bile duct and in                            the common hepatic duct which measured up to 14 mm.                           - Ascites was found on endosonographic examination                            of the peritoneal cavity (perihepatic/periduodenal).                           - Unfortunately I cannot position the endoscope to                            be able to visualize the major                            papilla/ampulla/uncinate process of the pancreas.                            This is a result of the significant duodenal                            stenosis in the duodenal sweep/angle. Moderate Sedation:      Not Applicable - Patient had care per Anesthesia. Recommendation:           - The patient will be observed post-procedure,                            until all discharge criteria are met.                           - Return patient to hospital ward for ongoing care.                           - Resume previous diet.                           - Observe patient's clinical course.                           - If LFTs continue to rise and patient becomes                            jaundiced, percutaneous biliary drain placement                             will be required. 1 could consider placing                            percutaneous  biliary drain in this particular                            setting already even if not jaundiced as this would                            be the expectation with the rise in alkaline                            phosphatase that has been occurring over the course                            of the last few months. This will be a decision for                            our oncology team to consider. Unfortunately no                            ERCP is possible at this point for this patient if                            progressive biliary obstruction develops. We are                            sorry we cannot be of more assistance.                           - Inpatient GI will sign off at this time, please                            call and let us  know if there is something else we                            can be of assistance with.                           - The findings and recommendations were discussed                            with the patient.                           - The findings and recommendations were discussed                            with the referring physician. Procedure Code(s):        --- Professional ---                           [phone], Esophagogastroduodenoscopy, flexible,                            transoral; with endoscopic ultrasound examination  limited to the esophagus, stomach or duodenum, and                            adjacent structures                           43247, Esophagogastroduodenoscopy, flexible,                            transoral; with removal of foreign body(s) Diagnosis Code(s):        --- Professional ---                           K20.90, Esophagitis, unspecified without bleeding                           Z93.1, Gastrostomy status                           Z98.0, Intestinal bypass and anastomosis status                            K31.5, Obstruction of duodenum                           K86.9, Disease of pancreas, unspecified                           R93.3, Abnormal findings on diagnostic imaging of                            other parts of digestive tract                           K83.8, Other specified diseases of biliary tract                           R18.8, Other ascites                           R79.89, Other specified abnormal findings of blood                            chemistry CPT copyright 2022 [employer]. All rights reserved. The codes documented in this report are preliminary and upon coder review may  be revised to meet current compliance requirements. Aloha Finner, MD 03/08/2024 2:06:36 PM Number of Addenda: 0

## 2024-03-08 NOTE — Progress Notes (Signed)
   Daily Progress Note   Patient Name: Chelsea Hansen       Date: 03/08/2024 DOB: 1971-04-21  Age: 53 y.o. MRN#: [id number] Attending Physician: Raenelle Coria, MD Primary Care Physician: [name], NP Admit Date: 03/04/2024  Reason for Consultation/Follow-up: Pain control  Subjective: Chart Reviewed. Updates Received. Patient Assessed. Chelsea Hansen reports she is feeling a little better today. Recently had episode of vomiting prior to my visit. She reports she is now waiting on something to eat as she is hungry. States she was able to hold most of her food down on yesterday without significant nausea or vomiting.   Her pain is somewhat improved however states at times it is severe and will take her breath. Rates pain 9 out of 10 at least several times daily. We will increase fentanyl  patch to 25mcg and continue with IV hydromorphone  for now to allow some time before restarting oral opioids in the setting of nausea and other procedures. She verbalized understanding.   She is hopeful to discharge home soon however knows the importance of remaining hospitalized until medical team feels she is at a safe place in her health for discharge to prevent any setbacks or re-admissions.   All questions answered and support provided.    Length of Stay: 3 days  Vital Signs: BP 125/81 (BP Location: Right Arm)   Pulse 75   Temp 98 F (36.7 C) (Oral)   Resp 20   Ht 5' 11 (1.803 m)   Wt 75.5 kg   SpO2 94%   BMI 23.21 kg/m  SpO2: SpO2: 94 % O2 Device: O2 Device: Room Air O2 Flow Rate: O2 Flow Rate (L/min): 5 L/min Last Weight  Most recent update: 03/07/2024  7:16 AM    Weight  75.5 kg (166 lb 7.2 oz)             Intake/Output Summary (Last 24 hours) at 03/08/2024 1537 Last data filed at 03/08/2024 1340 Gross per 24 hour  Intake 310 ml  Output 50 ml  Net 260 ml    Palliative Care Assessment & Plan   Code Status: Full code  Recommendations/Plan:  Continue with current plan of  care per medical team  PMT will continue to support and follow.  Please secure chat for urgent needs.   Symptom Management: Pain Increased Fentanyl  25mcg patch Continue hydromorphone  1mg  every 3 hours. (7 doses over past 24 hours) Will plan to restart Oxycodone  once nausea resolves and cleared by medical team Nausea Zofran  4mg  every 6 hours as needed  Promethazine  12.5 mg every 6 hours as needed   Thank you for allowing the Palliative Medicine Team to assist in the care of this patient.  Palliative Medicine Team providers are available by phone from 7am to 7pm daily and can be reached through the team cell phone. Should this patient require assistance outside of these hours, please call the patient's attending physician.  Any controlled substances utilized were prescribed in the context of palliative care. PDMP has been reviewed.  Visit consisted of counseling and education dealing with the complex and emotionally intense issues of symptom management and palliative care in the setting of serious and potentially life-threatening illness.  Levon Borer, AGPCNP-BC  Palliative Medicine TeamWL [facility]  [phone]  *Please note that this is a verbal dictation therefore any spelling or grammatical errors are due to the Dragon Medical One system interpretation.

## 2024-03-08 NOTE — Transfer of Care (Signed)
 Immediate Anesthesia Transfer of Care Note  Patient: Chelsea Hansen  Procedure(s) Performed: ULTRASOUND, UPPER GI TRACT, ENDOSCOPIC  Patient Location: PACU + Endoscopy  Anesthesia Type:MAC  Level of Consciousness: awake, alert , oriented, and patient cooperative  Airway & Oxygen Therapy: Patient Spontanous Breathing and Patient connected to face mask oxygen  Post-op Assessment: Report given to RN and Post -op Vital signs reviewed and stable  Post vital signs: Reviewed and stable  Last Vitals:  Vitals Value Taken Time  BP    Temp    Pulse 62 03/08/24 13:49  Resp 24 03/08/24 13:49  SpO2 100 % 03/08/24 13:49  Vitals shown include unfiled device data.  Last Pain:  Vitals:   03/08/24 1158  TempSrc: Temporal  PainSc: 8       Patients Stated Pain Goal: 2 (03/08/24 0606)  Complications: No notable events documented.

## 2024-03-08 NOTE — Plan of Care (Signed)

## 2024-03-08 NOTE — Plan of Care (Signed)
  Problem: Education: Goal: Knowledge of General Education information will improve Description: Including pain rating scale, medication(s)/side effects and non-pharmacologic comfort measures Outcome: Progressing   Problem: Health Behavior/Discharge Planning: Goal: Ability to manage health-related needs will improve Outcome: Progressing   Problem: Clinical Measurements: Goal: Ability to maintain clinical measurements within normal limits will improve Outcome: Progressing Goal: Will remain free from infection Outcome: Progressing Goal: Diagnostic test results will improve Outcome: Progressing Goal: Respiratory complications will improve Outcome: Progressing Goal: Cardiovascular complication will be avoided Outcome: Progressing   Problem: Activity: Goal: Risk for activity intolerance will decrease Outcome: Progressing   Problem: Coping: Goal: Level of anxiety will decrease Outcome: Progressing   Problem: Elimination: Goal: Will not experience complications related to bowel motility Outcome: Progressing Goal: Will not experience complications related to urinary retention Outcome: Progressing   Problem: Pain Managment: Goal: General experience of comfort will improve and/or be controlled Outcome: Progressing   Problem: Safety: Goal: Ability to remain free from injury will improve Outcome: Progressing   Problem: Skin Integrity: Goal: Risk for impaired skin integrity will decrease Outcome: Progressing

## 2024-03-08 NOTE — Anesthesia Preprocedure Evaluation (Signed)
 Anesthesia Evaluation  Patient identified by MRN, date of birth, ID band Patient awake    Reviewed: Allergy & Precautions, NPO status , Patient's Chart, lab work & pertinent test results  Airway Mallampati: II  TM Distance: >3 FB Neck ROM: Full    Dental no notable dental hx.    Pulmonary asthma    Pulmonary exam normal        Cardiovascular hypertension, + DVT  + dysrhythmias Supra Ventricular Tachycardia  Rhythm:Regular Rate:Normal     Neuro/Psych  Headaches  Anxiety Depression       GI/Hepatic Neg liver ROS,GERD  Medicated,,Duodenal stenosis, bile duct dilation, elevated alkaline phosphatase   Endo/Other  negative endocrine ROS    Renal/GU   negative genitourinary   Musculoskeletal   Abdominal Normal abdominal exam  (+)   Peds  Hematology  (+) Blood dyscrasia, anemia   Anesthesia Other Findings   Reproductive/Obstetrics                              Anesthesia Physical Anesthesia Plan  ASA: 3  Anesthesia Plan: MAC   Post-op Pain Management:    Induction:   PONV Risk Score and Plan: 2 and Propofol  infusion and Treatment may vary due to age or medical condition  Airway Management Planned: Simple Face Mask and Nasal Cannula  Additional Equipment: None  Intra-op Plan:   Post-operative Plan:   Informed Consent: I have reviewed the patients History and Physical, chart, labs and discussed the procedure including the risks, benefits and alternatives for the proposed anesthesia with the patient or authorized representative who has indicated his/her understanding and acceptance.     Dental advisory given  Plan Discussed with: CRNA  Anesthesia Plan Comments:         Anesthesia Quick Evaluation

## 2024-03-09 ENCOUNTER — Telehealth: Payer: MEDICAID | Admitting: *Deleted

## 2024-03-09 ENCOUNTER — Other Ambulatory Visit (HOSPITAL_COMMUNITY): Payer: Self-pay

## 2024-03-09 ENCOUNTER — Encounter: Payer: Self-pay | Admitting: General Practice

## 2024-03-09 ENCOUNTER — Encounter: Payer: Self-pay | Admitting: Hematology and Oncology

## 2024-03-09 ENCOUNTER — Encounter (HOSPITAL_COMMUNITY): Payer: Self-pay | Admitting: Gastroenterology

## 2024-03-09 DIAGNOSIS — K632 Fistula of intestine: Secondary | ICD-10-CM | POA: Diagnosis not present

## 2024-03-09 LAB — CBC WITH DIFFERENTIAL/PLATELET
Abs Immature Granulocytes: 0.35 K/uL — ABNORMAL HIGH (ref 0.00–0.07)
Basophils Absolute: 0 K/uL (ref 0.0–0.1)
Basophils Relative: 1 %
Eosinophils Absolute: 0.1 K/uL (ref 0.0–0.5)
Eosinophils Relative: 1 %
HCT: 26.8 % — ABNORMAL LOW (ref 36.0–46.0)
Hemoglobin: 8.5 g/dL — ABNORMAL LOW (ref 12.0–15.0)
Immature Granulocytes: 8 %
Lymphocytes Relative: 29 %
Lymphs Abs: 1.2 K/uL (ref 0.7–4.0)
MCH: 26.4 pg (ref 26.0–34.0)
MCHC: 31.7 g/dL (ref 30.0–36.0)
MCV: 83.2 fL (ref 80.0–100.0)
Monocytes Absolute: 0.3 K/uL (ref 0.1–1.0)
Monocytes Relative: 7 %
Neutro Abs: 2.3 K/uL (ref 1.7–7.7)
Neutrophils Relative %: 54 %
Platelets: 110 K/uL — ABNORMAL LOW (ref 150–400)
RBC: 3.22 MIL/uL — ABNORMAL LOW (ref 3.87–5.11)
RDW: 18.6 % — ABNORMAL HIGH (ref 11.5–15.5)
Smear Review: NORMAL
WBC: 4.3 K/uL (ref 4.0–10.5)
nRBC: 0 % (ref 0.0–0.2)

## 2024-03-09 LAB — CANCER ANTIGEN 19-9: CA 19-9: 77 U/mL — ABNORMAL HIGH (ref 0–35)

## 2024-03-09 MED ORDER — FENTANYL 25 MCG/HR TD PT72
1.0000 | MEDICATED_PATCH | TRANSDERMAL | 0 refills | Status: DC
  Filled 2024-03-09: qty 10, 30d supply, fill #0

## 2024-03-09 MED ORDER — HEPARIN SOD (PORK) LOCK FLUSH 100 UNIT/ML IV SOLN
500.0000 [IU] | INTRAVENOUS | Status: AC | PRN
  Administered 2024-03-09: 500 [IU]

## 2024-03-09 MED ORDER — ACETAMINOPHEN 325 MG PO TABS: 650.0000 mg | ORAL_TABLET | Freq: Four times a day (QID) | ORAL | Status: DC | PRN

## 2024-03-09 NOTE — Plan of Care (Signed)
  Problem: Education: Goal: Knowledge of General Education information will improve Description: Including pain rating scale, medication(s)/side effects and non-pharmacologic comfort measures 03/09/2024 1703 by Delores Kirsch, RN Outcome: Adequate for Discharge 03/09/2024 1649 by Delores Kirsch, RN Outcome: Progressing   Problem: Health Behavior/Discharge Planning: Goal: Ability to manage health-related needs will improve 03/09/2024 1703 by Delores Kirsch, RN Outcome: Adequate for Discharge 03/09/2024 1649 by Delores Kirsch, RN Outcome: Progressing   Problem: Clinical Measurements: Goal: Ability to maintain clinical measurements within normal limits will improve 03/09/2024 1703 by Delores Kirsch, RN Outcome: Adequate for Discharge 03/09/2024 1649 by Delores Kirsch, RN Outcome: Progressing Goal: Will remain free from infection Outcome: Adequate for Discharge Goal: Diagnostic test results will improve Outcome: Adequate for Discharge Goal: Respiratory complications will improve Outcome: Adequate for Discharge Goal: Cardiovascular complication will be avoided Outcome: Adequate for Discharge   Problem: Activity: Goal: Risk for activity intolerance will decrease Outcome: Adequate for Discharge   Problem: Nutrition: Goal: Adequate nutrition will be maintained Outcome: Adequate for Discharge   Problem: Coping: Goal: Level of anxiety will decrease Outcome: Adequate for Discharge   Problem: Elimination: Goal: Will not experience complications related to bowel motility Outcome: Adequate for Discharge Goal: Will not experience complications related to urinary retention Outcome: Adequate for Discharge   Problem: Pain Managment: Goal: General experience of comfort will improve and/or be controlled Outcome: Adequate for Discharge   Problem: Safety: Goal: Ability to remain free from injury will improve Outcome: Adequate for Discharge   Problem: Skin Integrity: Goal: Risk for  impaired skin integrity will decrease Outcome: Adequate for Discharge

## 2024-03-09 NOTE — Progress Notes (Signed)
 Discharge medications delivered to patient at bedside in a secure bag.

## 2024-03-09 NOTE — Progress Notes (Signed)
 [facility] Spiritual Care Note  Visited with Chelsea Hansen per her request via Morna Causey/NP because chaplain follows outpatient at [facility]. Chelsea Kattner was very appreciative of visit and opportunity for encouragement. She has upcoming appointment at [facility] to add a health care agent to the [facility] portion of her AD and found additional description of the paperwork's detail and scope very helpful. She has a copy to review in advance.   We plan to follow up at [facility] and then in person/by phone thereafter for further spiritual/emotional support.  Chaplain [name], MDiv, [facility] 24/7 pager [phone]

## 2024-03-09 NOTE — Progress Notes (Signed)
Discharge instructions given to patient no questions at this time.

## 2024-03-09 NOTE — Discharge Summary (Signed)
 Physician Discharge Summary  Patient ID: Chelsea Hansen MRN: [id number] DOB/AGE: 10/31/1970 53 y.o.  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  Patient carries diagnoses of metastatic breast cancer to bone and pancreatic mass with duodenal stricture small bowel obstruction, GJ bypass on July 2025, G-tube and J-tube placement, intolerance to G-tube feeding initially on TPN.  Patient was recently admitted with low hemoglobin and found to have abdominal fluid collection concerning for abscess, however, patient left the hospital AGAINST MEDICAL ADVICE and went home.  Patient returned to the emergency room on surgeons and oncologist advise. Patient reported persistent pain at the epigastric area and G-tube area, along with nausea and vomiting. At the emergency room, patient was hemodynamically stable.  CT scan was suggestive of abdominal wall abscess. Admitted with IV antibiotics. Surgery consulted.   Surgical documentation/Consultation by Elspeth Schultze, MD dated 03/07/2024: I personally saw the patient and performed a substantive portion of the medical decision making, in  conjunction with the Advanced Practice provider Almarie Pringle, PA-C for the treatment of below.   I personally discussed this patient's care with PA etc. about below. I reviewed recent lab values, vitals, and notes.   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   There is no concerning abscess that warrants surgical evaluation at this time.  She is not infected = stop ABx   However, she is having persistent drainage at the site of her former GYN jejunostomy tube.  Most likely developing a moderate to low output enterocutaneous fistula at that location.  Agree with pouching to try and control it.   Patient will need to nutrition to get through all this.  See if she can eat across this but I am guarded on that.  I worry that she would require some TPN at least in the short-term to see if we can get her on chemotherapy and keep things under control long-term TPN not great for cancer patients though.     I do not think she will improve until she gets some chemotherapy to get her peritoneal metastasis under better control.  Defer to Dr. [name] but I worry about delaying further.  Consider have palliative care involved as well to work with goals of care.  Reasonable to try and be as aggressive possible given her young age but prognosis challenging at best   I think reoperating would be a very bad idea and delay care which she needs    Abdominal wall abscess with inflammation, presence of indwelling J-tube: -See surgical documentation above.   - Abdominal wall abscess ruled out.  Severe hypokalemia: -Due to inadequate oral intake.   - Potassium was monitored and replaced.     Breast cancer with metastasis:  Pancreatic lesion with peritoneal carcinomatosis: -Patient is scheduled to start chemotherapy.     Anemia of chronic disease:  -Hemoglobin of 8.5 g/dL  prior to discharge.   -Patient was transfused with packed red blood cells during the hospital stay.  Abdominal pain with biliary obstruction:  - Normal LFTs.   -MRCP revealed ill-defined mass at the region of pancreatic uncinate process and third part of duodenum with dilatation of the common bile duct and pancreatic duct,  peritoneal carcinomatosis  Consults: GI and Palliative care team  Significant Diagnostic Studies:  Endoscopic findings:       No gross lesions were noted in the proximal esophagus and in the mid       esophagus.      LA Grade B (one or more mucosal breaks greater than 5 mm, not extending       between the tops of two mucosal folds) esophagitis with no bleeding was       found in the distal esophagus.      There was evidence of a patent gastrostomy with no G-tube present in the       gastric body. This was characterized by healthy appearing mucosa.      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 This was characterized by congestion, erosion, inflammation       and an intact staple line (these congested erosive changes were noted at       areas of the staples). Removal of 7 staples was accomplished with a       regular forceps.      The gastric antrum, prepyloric region of the stomach and pylorus were       normal.      No gross lesions were noted in the duodenal bulb.      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      ENDOSONOGRAPHIC FINDING: :      Pancreatic parenchymal abnormalities were noted in the pancreatic head.       These consisted of hyperechoic strands.      The pancreatic duct had a dilated endosonographic appearance in the       pancreatic head and genu of the pancreas. The pancreatic duct measured       up to 4.0 mm in diameter.      There was dilation in the common bile duct and in the common hepatic       duct which measured  up to 14 mm.      Endosonographic imaging in the visualized portion of the liver showed no       mass.      A moderate amount of fluid, visualized as an anechoic feature, was found       in the peritoneal cavity and in the periduodenal and perihepatic spaces.      The celiac region was visualized.      Unfortunately I cannot position the endoscope to be able to visualize       the major papilla/ampulla/uncinate process of the pancreas. This is a       result of the significant duodenal stenosis in the duodenal sweep/angle. Impression:  EGD impression:                           - No gross lesions in the proximal esophagus and in                            the mid esophagus. LA Grade B esophagitis with no                            bleeding noted distally (this is better than prior).                           - Patent gastrostomy with no G-tube present                            characterized by healthy appearing mucosa.                           - A gastrojejunostomy was found, characterized by                            inflammation, congestion, erosion and an intact                            staple line (at staple line that was visualized                            there were congested erosive changes which is a                            reason that 7 of the staples were removed).                           - Normal antrum, prepyloric region of the stomach                            and pylorus.                           - No gross lesions in the duodenal bulb.                           - Acquired duodenal stenosis in the duodenal sweep                            (this does not allow endoscope or duodenoscope or                            echoendoscope to traverse).                           EUS impression:                           - Pancreatic parenchymal abnormalities consisting  of hyperechoic strands were noted in the pancreatic                             head.                           - The pancreatic duct had a dilated endosonographic                            appearance in the pancreatic head and genu of the                            pancreas. The pancreatic duct measured up to 4.0 mm                            in diameter in the head and neck.                           - There was dilation in the common bile duct and in                            the common hepatic duct which measured up to 14 mm.                           - Ascites was found on endosonographic examination                            of the peritoneal cavity (perihepatic/periduodenal).                           - Unfortunately I cannot position the endoscope to                            be able to visualize the major                            papilla/ampulla/uncinate process of the pancreas.                            This is a result of the significant duodenal                            stenosis in the duodenal sweep/angle.    Discharge Exam: Blood pressure 111/74, pulse 71, temperature 98.9 F (37.2 C), temperature source Oral, resp. rate 18, height 5' 11 (1.803 m), weight 79.1 kg, SpO2 97%.   Disposition: Discharge disposition: 06-Home-Health Care Svc       Discharge Instructions     Diet - low sodium heart healthy   Complete by: As directed    Discharge wound care:   Complete by: As directed    Continue current wound care plan   Increase activity slowly   Complete by: As directed       Allergies as of 03/09/2024       Reactions   Enhertu  [fam-trastuzumab  Deruxtec-nxki] Other (See  Comments)   Headache and chest pain. See progress note from 02/16/2024. Patient able to complete infusion.        Medication List     STOP taking these medications    anastrozole  1 MG tablet Commonly known as: ARIMIDEX    ciprofloxacin  500 MG tablet Commonly known as: Cipro    dexamethasone  4 MG tablet Commonly known as: DECADRON     fentaNYL  12 MCG/HR Commonly known as: DURAGESIC  Replaced by: fentaNYL  25 MCG/HR   montelukast  10 MG tablet Commonly known as: SINGULAIR    pantoprazole  40 MG tablet Commonly known as: Protonix    polyethylene glycol powder 17 GM/SCOOP powder Commonly known as: GLYCOLAX /MIRALAX    senna 8.6 MG Tabs tablet Commonly known as: SENOKOT   sulfamethoxazole -trimethoprim  800-160 MG tablet Commonly known as: BACTRIM  DS   thiamine  100 MG tablet Commonly known as: VITAMIN B1       TAKE these medications    acetaminophen  325 MG tablet Commonly known as: TYLENOL  Take 2 tablets (650 mg total) by mouth every 6 (six) hours as needed for mild pain (pain score 1-3) or fever (or Fever >/= 101).   fentaNYL  25 MCG/HR Commonly known as: DURAGESIC  Place 1 patch onto the skin every 3 (three) days. Start taking on: March 11, 2024 Replaces: fentaNYL  12 MCG/HR   lidocaine -prilocaine  cream Commonly known as: EMLA  Apply to affected area once   ondansetron  8 MG tablet Commonly known as: ZOFRAN  Take 1 tablet (8 mg total) by mouth every 8 (eight) hours as needed for nausea or vomiting. Start on the third day after chemotherapy.   oxyCODONE  15 MG immediate release tablet Commonly known as: ROXICODONE  Take 1 tablet (15 mg total) by mouth every 4 (four) hours as needed for pain.   prochlorperazine  10 MG tablet Commonly known as: COMPAZINE  Take 1 tablet (10 mg total) by mouth every 6 (six) hours as needed for nausea or vomiting.               Discharge Care Instructions  (From admission, onward)           Start     Ordered   03/09/24 0000  Discharge wound care:       Comments: Continue current wound care plan   03/09/24 1705           Time spent: 35 minutes.  SignedBETHA Leatrice LILLETTE Rosario 03/09/2024, 5:06 PM

## 2024-03-09 NOTE — Plan of Care (Signed)

## 2024-03-09 NOTE — Progress Notes (Signed)
   Daily Progress Note   Patient Name: Chelsea Hansen       Date: 03/09/2024 DOB: 10/09/1970  Age: 53 y.o. MRN#: [id number] Attending Physician: Rosario Leatrice FERNS, MD Primary Care Physician: [name], NP Admit Date: 03/04/2024  Reason for Consultation/Follow-up: Pain control  Subjective: Chart Reviewed. Updates Received. Patient Assessed. Ms. Kittleson reports she is feeling a little better today.   Discussed with her regarding her current pain and anti emetic regimen, shared with her that this provider is Armed forces training and education officer.   Patient is thankful for the help she is receiving from the palliative care at the cancer center.   Medication history noted and reviewed.  All questions answered and support provided.    Length of Stay: 4 days  Vital Signs: BP 111/74 (BP Location: Left Arm)   Pulse 71   Temp 98.9 F (37.2 C) (Oral)   Resp 18   Ht 5' 11 (1.803 m)   Wt 79.1 kg   SpO2 97%   BMI 24.32 kg/m  SpO2: SpO2: 97 % O2 Device: O2 Device: Room Air O2 Flow Rate: O2 Flow Rate (L/min): 5 L/min Last Weight  Most recent update: 03/09/2024  5:11 AM    Weight  79.1 kg (174 lb 6.1 oz)             Intake/Output Summary (Last 24 hours) at 03/09/2024 1405 Last data filed at 03/09/2024 1047 Gross per 24 hour  Intake 150 ml  Output 100 ml  Net 50 ml    Palliative Care Assessment & Plan   Code Status: Full code  Recommendations/Plan:  Continue with current plan of care per medical team  PMT will continue to support and follow.    Symptom Management: Pain  Fentanyl  25mcg patch - patient asks if insurance authorization will be an issue in her getting this medication after discharge.  Continue hydromorphone  1mg  every 3 hours.  Will plan to restart Oxycodone  in am on 03-10-24 - discussed with patient.  Nausea Zofran  4mg  every 6 hours as needed  Promethazine  12.5 mg every 6 hours as needed   Thank you for allowing the Palliative Medicine Team to assist in the care of  this patient.  Palliative Medicine Team providers are available by phone from 7am to 7pm daily and can be reached through the team cell phone. Should this patient require assistance outside of these hours, please call the patient's attending physician.  Any controlled substances utilized were prescribed in the context of palliative care. PDMP has been reviewed.  Visit consisted of counseling and education dealing with the complex and emotionally intense issues of symptom management and palliative care in the setting of serious and potentially life-threatening illness. Mod MDM Lonia Serve MD Palliative Medicine Team [phone]  *Please note that this is a verbal dictation therefore any spelling or grammatical errors are due to the Dragon Medical One system interpretation.

## 2024-03-09 NOTE — Anesthesia Postprocedure Evaluation (Signed)
 Anesthesia Post Note  Patient: Chelsea Hansen  Procedure(s) Performed: ULTRASOUND, UPPER GI TRACT, ENDOSCOPIC     Patient location during evaluation: PACU Anesthesia Type: MAC Level of consciousness: awake and alert Pain management: pain level controlled Vital Signs Assessment: post-procedure vital signs reviewed and stable Respiratory status: spontaneous breathing, nonlabored ventilation, respiratory function stable and patient connected to nasal cannula oxygen Cardiovascular status: stable and blood pressure returned to baseline Postop Assessment: no apparent nausea or vomiting Anesthetic complications: no   No notable events documented.  Last Vitals:  Vitals:   03/08/24 2123 03/09/24 0515  BP: 95/64 100/66  Pulse: 66 69  Resp: 18 16  Temp: 36.9 C 36.9 C  SpO2: 97% 96%    Last Pain:  Vitals:   03/09/24 0515  TempSrc: Oral  PainSc:                  Cordella SQUIBB Genesis Paget

## 2024-03-09 NOTE — Progress Notes (Incomplete)
 PROGRESS NOTE    Chelsea Hansen  FMW:[id number] DOB: 09-06-1970 DOA: 03/04/2024 PCP: [name], NP    Brief Narrative:  53 year old with very complicated medical and surgical history, metastatic breast cancer to bone and pancreatic mass with duodenal stricture small bowel obstruction, GJ bypass on July 2025, G-tube and J-tube placement, intolerance to G-tube feeding initially on TPN.  She was recently admitted with low hemoglobin and found to have abdominal fluid collection concerning for abscess however she left AMA and went home.  Came back to the emergency room on surgeons and oncologist advised.  Patient has persistent pain in the epigastric area and G-tube area along with nausea and vomiting.  At the emergency room hemodynamically stable.  Hemoglobin 7.5.  Potassium 2.7.  CT scan consistent with abdominal wall abscess.  Admitted with IV antibiotics.  Surgery consulted.  Subjective: Patient seen and examined in the morning rounds.  She is scheduled for endoscopy.  She tells me that she is ready to eat and go home.  She has been asking for pain medication nausea medication at night.  She is very focused on going home.  We discussed about procedure today, and if she is able to eat well overnight to go home tomorrow morning. Patient tells me that she ate salmon last night and it went well.  Assessment & Plan:   Abdominal wall abscess with inflammation, presence of indwelling J-tube.  Persistent drainage from the previous G-tube site.This was thought to be fluid collection.  Unlikely an abscess.  Initially treated with IV antibiotics.  Now discontinued.Advance to regular diet.  Adequate pain medications.  Nausea medications.  Severe hypokalemia: Due to inadequate oral intake.  Potassium replaced.  Adequate today.  Breast cancer with metastasis: Pancreatic lesion with peritoneal carcinomatosis.  She is scheduled to start on chemotherapy this week.  Hopefully her infection gets controlled  and she can start chemotherapy. Challenging treatment and outcome.  Oncology and palliative following. Not sure how much oral intake we will be able to establish on her.  Anemia of chronic disease: Repeat hemoglobin 6.8.  1 unit PRBC-8.5.  Recheck tomorrow morning.  Abdominal pain with biliary obstruction: Patient with normal LFTs. MRCP with ill-defined mass at the region of pancreatic uncinate process and third part of duodenum with dilatation of the common bile duct and pancreatic duct,  peritoneal carcinomatosis For EGD, EUS and biopsy today by GI.    DVT prophylaxis: SCDs Start: 03/05/24 9380   Code Status: Full code Family Communication: None at the bedside Disposition Plan: Status is: Inpatient Remains inpatient appropriate because: Significant symptoms, inpatient procedures and IV pain medications     Consultants:  General Surgery Gastroenterology Oncology Palliative  Procedures:  Upper GI endoscopy, planned today.  Antimicrobials:  Zosyn  9/14-9/17     Objective: Vitals:   03/08/24 2123 03/09/24 0511 03/09/24 0515 03/09/24 1336  BP: 95/64  100/66 111/74  Pulse: 66  69 71  Resp: 18  16 18   Temp: 98.4 F (36.9 C)  98.5 F (36.9 C) 98.9 F (37.2 C)  TempSrc: Oral  Oral Oral  SpO2: 97%  96% 97%  Weight:  79.1 kg    Height:        Intake/Output Summary (Last 24 hours) at 03/09/2024 1648 Last data filed at 03/09/2024 1047 Gross per 24 hour  Intake 150 ml  Output 100 ml  Net 50 ml   Filed Weights   03/04/24 2053 03/07/24 0529 03/09/24 0511  Weight: 72.6 kg 75.5 kg 79.1  kg    Examination:  General exam: Looks fairly comfortable resting. Respiratory system: Clear to auscultation. Respiratory effort normal. Cardiovascular system: S1 & S2 heard, RRR. No JVD, murmurs, rubs, gallops or clicks. No pedal edema. Gastrointestinal system: Abdomen soft.  Mild tenderness along the epigastrium and left lower quadrant. She has a midline J-tube with some drainage  around it.  Not used. Patient has pouch fitted on the left lateral quadrant with minimal cloudy drainage. Central nervous system: Alert and oriented. No focal neurological deficits. Extremities: Symmetric 5 x 5 power. Skin: No rashes, lesions or ulcers    Data Reviewed: I have personally reviewed following labs and imaging studies  CBC: Recent Labs  Lab 03/04/24 2305 03/04/24 2316 03/05/24 1210 03/06/24 1322 03/07/24 0540 03/07/24 2245 03/09/24 0550  WBC 3.2*  --  2.9* 3.4* 3.6* 4.4 4.3  NEUTROABS 1.5*  --  1.4* 1.6* 1.7  --  2.3  HGB 7.6*   < > 7.0* 7.2* 6.8* 8.5* 8.5*  HCT 23.7*   < > 22.4* 23.1* 22.1* 27.3* 26.8*  MCV 80.6  --  81.5 81.9 82.8 82.0 83.2  PLT 138*  --  138* 124* 107* 107* 110*   < > = values in this interval not displayed.   Basic Metabolic Panel: Recent Labs  Lab 03/04/24 2305 03/04/24 2316 03/05/24 1210 03/06/24 1000 03/08/24 0606  NA 137 136 137 141 139  K 2.7* 2.7* 3.8 4.1 4.1  CL 96* 93* 98 104 102  CO2 28  --  26 26 26   GLUCOSE 108* 110* 100* 114* 90  BUN 18 18 15 10 11   CREATININE 0.92 1.10* 0.72 0.86 0.59  CALCIUM  8.9  --  8.9 8.8* 9.0  MG  --   --  2.1 2.0  --   PHOS  --   --   --  3.6  --    GFR: Estimated Creatinine Clearance: 93 mL/min (by C-G formula based on SCr of 0.59 mg/dL). Liver Function Tests: Recent Labs  Lab 03/04/24 2305 03/06/24 1000 03/08/24 0606  AST 47* 70* 46*  ALT 45* 52* 50*  ALKPHOS 1,109* 1,138* 1,235*  BILITOT 0.4 0.3 0.3  PROT 6.1* 5.6* 5.4*  ALBUMIN  3.3* 3.0* 2.9*   No results for input(s): LIPASE, AMYLASE in the last 168 hours. No results for input(s): AMMONIA in the last 168 hours. Coagulation Profile: No results for input(s): INR, PROTIME in the last 168 hours.  Cardiac Enzymes: No results for input(s): CKTOTAL, CKMB, CKMBINDEX, TROPONINI in the last 168 hours. BNP (last 3 results) No results for input(s): PROBNP in the last 8760 hours. HbA1C: No results for input(s):  HGBA1C in the last 72 hours. CBG: No results for input(s): GLUCAP in the last 168 hours. Lipid Profile: No results for input(s): CHOL, HDL, LDLCALC, TRIG, CHOLHDL, LDLDIRECT in the last 72 hours. Thyroid  Function Tests: No results for input(s): TSH, T4TOTAL, FREET4, T3FREE, THYROIDAB in the last 72 hours. Anemia Panel: No results for input(s): VITAMINB12, FOLATE, FERRITIN, TIBC, IRON, RETICCTPCT in the last 72 hours. Sepsis Labs: Recent Labs  Lab 03/04/24 2317 03/05/24 0138  LATICACIDVEN 1.1 0.8    No results found for this or any previous visit (from the past 240 hours).        Radiology Studies: No results found.        Scheduled Meds:  Chlorhexidine  Gluconate Cloth  6 each Topical Daily   fentaNYL   1 patch Transdermal Q72H   sodium chloride  flush  10-40 mL Intracatheter  Q12H   Continuous Infusions:  promethazine  (PHENERGAN ) injection (IM or IVPB) 12.5 mg (03/09/24 1345)     LOS: 4 days    Time spent: 35 minutes.    Chelsea LILLETTE Chapel, MD [facility]

## 2024-03-09 NOTE — Plan of Care (Signed)

## 2024-03-11 ENCOUNTER — Other Ambulatory Visit: Payer: Self-pay | Admitting: Nurse Practitioner

## 2024-03-12 ENCOUNTER — Encounter: Payer: Self-pay | Admitting: Hematology and Oncology

## 2024-03-12 ENCOUNTER — Encounter (HOSPITAL_COMMUNITY): Payer: Self-pay | Admitting: Radiology

## 2024-03-12 ENCOUNTER — Other Ambulatory Visit (HOSPITAL_COMMUNITY): Payer: Self-pay | Admitting: Interventional Radiology

## 2024-03-12 ENCOUNTER — Other Ambulatory Visit (HOSPITAL_COMMUNITY): Payer: Self-pay

## 2024-03-12 ENCOUNTER — Telehealth: Payer: Self-pay | Admitting: *Deleted

## 2024-03-12 DIAGNOSIS — R112 Nausea with vomiting, unspecified: Secondary | ICD-10-CM

## 2024-03-12 DIAGNOSIS — C259 Malignant neoplasm of pancreas, unspecified: Secondary | ICD-10-CM

## 2024-03-12 MED ORDER — OXYCODONE HCL 15 MG PO TABS
15.0000 mg | ORAL_TABLET | ORAL | 0 refills | Status: DC | PRN
  Filled 2024-03-12: qty 90, 15d supply, fill #0

## 2024-03-12 NOTE — Transitions of Care (Post Inpatient/ED Visit) (Signed)
   03/12/2024  Name: Chelsea Hansen MRN: [id number] DOB: [birth year]  Today's TOC FU Call Status: Today's TOC FU Call Status:: Unsuccessful Call (1st Attempt) Unsuccessful Call (1st Attempt) Date: 03/12/24  Attempted to reach the patient regarding the most recent Inpatient/ED visit.  Follow Up Plan: Additional outreach attempts will be made to reach the patient to complete the Transitions of Care (Post Inpatient/ED visit) call.   [name] RN, BSN [place]  [facility] RN Care Manager [phone]

## 2024-03-12 NOTE — Addendum Note (Signed)
 Encounter addended by: Crawford Dorothyann POUR on: 03/12/2024 12:36 PM  Actions taken: Image moved, Image edited

## 2024-03-13 ENCOUNTER — Other Ambulatory Visit: Payer: Self-pay

## 2024-03-13 ENCOUNTER — Telehealth: Payer: Self-pay | Admitting: *Deleted

## 2024-03-13 NOTE — Transitions of Care (Post Inpatient/ED Visit) (Signed)
   03/13/2024  Name: Chelsea Hansen MRN: [id number] DOB: 1970-08-11  Today's TOC FU Call Status: Today's TOC FU Call Status:: Unsuccessful Call (2nd Attempt) Unsuccessful Call (2nd Attempt) Date: 03/13/24  Attempted to reach the patient regarding the most recent Inpatient/ED visit.  Follow Up Plan: Additional outreach attempts will be made to reach the patient to complete the Transitions of Care (Post Inpatient/ED visit) call.   [name] RN, BSN [place]  [facility] RN Care Manager [phone]

## 2024-03-14 ENCOUNTER — Inpatient Hospital Stay: Payer: MEDICAID

## 2024-03-14 ENCOUNTER — Encounter: Payer: Self-pay | Admitting: General Practice

## 2024-03-14 ENCOUNTER — Telehealth: Payer: Self-pay | Admitting: *Deleted

## 2024-03-14 ENCOUNTER — Inpatient Hospital Stay (HOSPITAL_BASED_OUTPATIENT_CLINIC_OR_DEPARTMENT_OTHER): Payer: MEDICAID | Admitting: Hematology and Oncology

## 2024-03-14 VITALS — BP 119/81 | HR 84 | Temp 98.3°F | Resp 18 | Ht 71.0 in | Wt 167.5 lb

## 2024-03-14 DIAGNOSIS — E876 Hypokalemia: Secondary | ICD-10-CM | POA: Diagnosis not present

## 2024-03-14 DIAGNOSIS — Z170 Estrogen receptor positive status [ER+]: Secondary | ICD-10-CM

## 2024-03-14 DIAGNOSIS — C50411 Malignant neoplasm of upper-outer quadrant of right female breast: Secondary | ICD-10-CM | POA: Diagnosis not present

## 2024-03-14 DIAGNOSIS — D84821 Immunodeficiency due to drugs: Secondary | ICD-10-CM | POA: Diagnosis not present

## 2024-03-14 DIAGNOSIS — R509 Fever, unspecified: Secondary | ICD-10-CM | POA: Diagnosis not present

## 2024-03-14 DIAGNOSIS — R748 Abnormal levels of other serum enzymes: Secondary | ICD-10-CM | POA: Diagnosis not present

## 2024-03-14 DIAGNOSIS — R9431 Abnormal electrocardiogram [ECG] [EKG]: Secondary | ICD-10-CM | POA: Diagnosis not present

## 2024-03-14 DIAGNOSIS — C50412 Malignant neoplasm of upper-outer quadrant of left female breast: Secondary | ICD-10-CM | POA: Diagnosis present

## 2024-03-14 DIAGNOSIS — Z5112 Encounter for antineoplastic immunotherapy: Secondary | ICD-10-CM | POA: Diagnosis present

## 2024-03-14 DIAGNOSIS — C7951 Secondary malignant neoplasm of bone: Secondary | ICD-10-CM | POA: Diagnosis not present

## 2024-03-14 DIAGNOSIS — Z95828 Presence of other vascular implants and grafts: Secondary | ICD-10-CM

## 2024-03-14 DIAGNOSIS — D6481 Anemia due to antineoplastic chemotherapy: Secondary | ICD-10-CM | POA: Diagnosis not present

## 2024-03-14 DIAGNOSIS — E860 Dehydration: Secondary | ICD-10-CM | POA: Diagnosis not present

## 2024-03-14 DIAGNOSIS — G620 Drug-induced polyneuropathy: Secondary | ICD-10-CM | POA: Diagnosis not present

## 2024-03-14 DIAGNOSIS — R197 Diarrhea, unspecified: Secondary | ICD-10-CM | POA: Diagnosis not present

## 2024-03-14 LAB — CMP (CANCER CENTER ONLY)
ALT: 23 U/L (ref 0–44)
AST: 29 U/L (ref 15–41)
Albumin: 3.1 g/dL — ABNORMAL LOW (ref 3.5–5.0)
Alkaline Phosphatase: 1492 U/L — ABNORMAL HIGH (ref 38–126)
Anion gap: 5 (ref 5–15)
BUN: 6 mg/dL (ref 6–20)
CO2: 28 mmol/L (ref 22–32)
Calcium: 8.7 mg/dL — ABNORMAL LOW (ref 8.9–10.3)
Chloride: 105 mmol/L (ref 98–111)
Creatinine: 0.47 mg/dL (ref 0.44–1.00)
GFR, Estimated: >60 mL/min (ref >60)
Glucose, Bld: 90 mg/dL (ref 70–99)
Potassium: 3.5 mmol/L (ref 3.5–5.1)
Sodium: 138 mmol/L (ref 135–145)
Total Bilirubin: 0.5 mg/dL (ref 0.0–1.2)
Total Protein: 6 g/dL — ABNORMAL LOW (ref 6.5–8.1)

## 2024-03-14 LAB — CBC WITH DIFFERENTIAL (CANCER CENTER ONLY)
Abs Immature Granulocytes: 0.08 K/uL — ABNORMAL HIGH (ref 0.00–0.07)
Basophils Absolute: 0 K/uL (ref 0.0–0.1)
Basophils Relative: 1 %
Eosinophils Absolute: 0 K/uL (ref 0.0–0.5)
Eosinophils Relative: 1 %
HCT: 26.8 % — ABNORMAL LOW (ref 36.0–46.0)
Hemoglobin: 8.9 g/dL — ABNORMAL LOW (ref 12.0–15.0)
Immature Granulocytes: 2 %
Lymphocytes Relative: 31 %
Lymphs Abs: 1.5 K/uL (ref 0.7–4.0)
MCH: 26.3 pg (ref 26.0–34.0)
MCHC: 33.2 g/dL (ref 30.0–36.0)
MCV: 79.1 fL — ABNORMAL LOW (ref 80.0–100.0)
Monocytes Absolute: 0.5 K/uL (ref 0.1–1.0)
Monocytes Relative: 10 %
Neutro Abs: 2.7 K/uL (ref 1.7–7.7)
Neutrophils Relative %: 55 %
Platelet Count: 132 K/uL — ABNORMAL LOW (ref 150–400)
RBC: 3.39 MIL/uL — ABNORMAL LOW (ref 3.87–5.11)
RDW: 17.8 % — ABNORMAL HIGH (ref 11.5–15.5)
WBC Count: 4.8 K/uL (ref 4.0–10.5)
nRBC: 0 % (ref 0.0–0.2)

## 2024-03-14 MED ORDER — SODIUM CHLORIDE 0.9% FLUSH
10.0000 mL | INTRAVENOUS | Status: DC | PRN
  Administered 2024-03-14: 10 mL

## 2024-03-14 MED ORDER — DENOSUMAB 120 MG/1.7ML ~~LOC~~ SOLN
120.0000 mg | Freq: Once | SUBCUTANEOUS | Status: AC
  Administered 2024-03-14: 120 mg via SUBCUTANEOUS
  Filled 2024-03-14: qty 1.7

## 2024-03-14 MED ORDER — SODIUM CHLORIDE 0.9 % IV SOLN
150.0000 mg | Freq: Once | INTRAVENOUS | Status: AC
  Administered 2024-03-14: 150 mg via INTRAVENOUS
  Filled 2024-03-14: qty 150

## 2024-03-14 MED ORDER — FAM-TRASTUZUMAB DERUXTECAN-NXKI CHEMO 100 MG IV SOLR: 5.4000 mg/kg | Freq: Once | INTRAVENOUS | Status: DC

## 2024-03-14 MED ORDER — DIPHENHYDRAMINE HCL 50 MG/ML IJ SOLN
50.0000 mg | Freq: Once | INTRAMUSCULAR | Status: AC
  Administered 2024-03-14: 50 mg via INTRAVENOUS
  Filled 2024-03-14: qty 1

## 2024-03-14 MED ORDER — DEXAMETHASONE SODIUM PHOSPHATE 10 MG/ML IJ SOLN
10.0000 mg | Freq: Once | INTRAMUSCULAR | Status: AC
  Administered 2024-03-14: 10 mg via INTRAVENOUS
  Filled 2024-03-14: qty 1

## 2024-03-14 MED ORDER — ACETAMINOPHEN 325 MG PO TABS
650.0000 mg | ORAL_TABLET | Freq: Once | ORAL | Status: AC
  Administered 2024-03-14: 650 mg via ORAL
  Filled 2024-03-14: qty 2

## 2024-03-14 MED ORDER — PALONOSETRON HCL INJECTION 0.25 MG/5ML
0.2500 mg | Freq: Once | INTRAVENOUS | Status: AC
  Administered 2024-03-14: 0.25 mg via INTRAVENOUS
  Filled 2024-03-14: qty 5

## 2024-03-14 MED ORDER — DEXTROSE 5 % IV SOLN: INTRAVENOUS | Status: DC

## 2024-03-14 MED ORDER — FAM-TRASTUZUMAB DERUXTECAN-NXKI CHEMO 100 MG IV SOLR
3.2000 mg/kg | Freq: Once | INTRAVENOUS | Status: AC
  Administered 2024-03-14: 260 mg via INTRAVENOUS
  Filled 2024-03-14: qty 13

## 2024-03-14 MED ORDER — DIPHENHYDRAMINE HCL 25 MG PO CAPS
50.0000 mg | ORAL_CAPSULE | Freq: Once | ORAL | Status: DC
  Filled 2024-03-14: qty 2

## 2024-03-14 NOTE — Transitions of Care (Post Inpatient/ED Visit) (Signed)
   03/14/2024  Name: NATARA MONFORT MRN: [id number] DOB: [birth year]  Today's TOC FU Call Status: Today's TOC FU Call Status:: Unsuccessful Call (3rd Attempt) Unsuccessful Call (3rd Attempt) Date: 03/14/24  Attempted to reach the patient regarding the most recent Inpatient/ED visit.  Follow Up Plan: No further outreach attempts will be made at this time. We have been unable to contact the patient.  [name] RN, BSN [place]  [facility] RN Care Manager [phone]

## 2024-03-14 NOTE — Patient Instructions (Signed)
 [facility] - A DEPT OF [facility]  Discharge Instructions: Thank you for choosing [facility] to provide your oncology and hematology care.   If you have a lab appointment with the Cancer Center, please go directly to the Cancer Center and check in at the registration area.   Wear comfortable clothing and clothing appropriate for easy access to any Portacath or PICC line.   We strive to give you quality time with your provider. You may need to reschedule your appointment if you arrive late (15 or more minutes).  Arriving late affects you and other patients whose appointments are after yours.  Also, if you miss three or more appointments without notifying the office, you may be dismissed from the clinic at the provider's discretion.      For prescription refill requests, have your pharmacy contact our office and allow 72 hours for refills to be completed.    Today you received the following chemotherapy and/or immunotherapy agents Enhertu      To help prevent nausea and vomiting after your treatment, we encourage you to take your nausea medication as directed.  BELOW ARE SYMPTOMS THAT SHOULD BE REPORTED IMMEDIATELY: *FEVER GREATER THAN 100.4 F (38 C) OR HIGHER *CHILLS OR SWEATING *NAUSEA AND VOMITING THAT IS NOT CONTROLLED WITH YOUR NAUSEA MEDICATION *UNUSUAL SHORTNESS OF BREATH *UNUSUAL BRUISING OR BLEEDING *URINARY PROBLEMS (pain or burning when urinating, or frequent urination) *BOWEL PROBLEMS (unusual diarrhea, constipation, pain near the anus) TENDERNESS IN MOUTH AND THROAT WITH OR WITHOUT PRESENCE OF ULCERS (sore throat, sores in mouth, or a toothache) UNUSUAL RASH, SWELLING OR PAIN  UNUSUAL VAGINAL DISCHARGE OR ITCHING   Items with * indicate a potential emergency and should be followed up as soon as possible or go to the Emergency Department if any problems should occur.  Please show the CHEMOTHERAPY ALERT CARD or IMMUNOTHERAPY  ALERT CARD at check-in to the Emergency Department and triage nurse.  Should you have questions after your visit or need to cancel or reschedule your appointment, please contact [facility]  Dept: [phone]  and follow the prompts.  Office hours are 8:00 a.m. to 4:30 p.m. Monday - Friday. Please note that voicemails left after 4:00 p.m. may not be returned until the following business day.  We are closed weekends and major holidays. You have access to a nurse at all times for urgent questions. Please call the main number to the clinic Dept: [phone] and follow the prompts.   For any non-urgent questions, you may also contact your provider using MyChart. We now offer e-Visits for anyone 58 and older to request care online for non-urgent symptoms. For details visit [email or web address].   Also download the MyChart app! Go to the app store, search "MyChart", open the app, select [place], and log in with your MyChart username and password.  Fam-Trastuzumab Deruxtecan Injection What is this medication? FAM-TRASTUZUMAB DERUXTECAN (fam-tras TOOZ eu mab DER ux TEE kan) treats some types of cancer. It works by blocking a protein that causes cancer cells to grow and multiply. This helps to slow or stop the spread of cancer cells. This medicine may be used for other purposes; ask your health care provider or pharmacist if you have questions. COMMON BRAND NAME(S): ENHERTU What should I tell my care team before I take this medication? They need to know if you have any of these  conditions: Heart disease Heart failure Infection, especially a viral infection, such as chickenpox, cold sores, or herpes Liver disease Lung or breathing disease, such as asthma or COPD An unusual or allergic reaction to fam-trastuzumab deruxtecan, other medications, foods, dyes, or preservatives Pregnant or trying to get pregnant Breast-feeding How should I use  this medication? This medication is injected into a vein. It is given by your care team in a hospital or clinic setting. A special MedGuide will be given to you before each treatment. Be sure to read this information carefully each time. Talk to your care team about the use of this medication in children. Special care may be needed. Overdosage: If you think you have taken too much of this medicine contact a poison control center or emergency room at once. NOTE: This medicine is only for you. Do not share this medicine with others. What if I miss a dose? It is important not to miss your dose. Call your care team if you are unable to keep an appointment. What may interact with this medication? Interactions are not expected. This list may not describe all possible interactions. Give your health care provider a list of all the medicines, herbs, non-prescription drugs, or dietary supplements you use. Also tell them if you smoke, drink alcohol, or use illegal drugs. Some items may interact with your medicine. What should I watch for while using this medication? Visit your care team for regular checks on your progress. Tell your care team if your symptoms do not start to get better or if they get worse. This medication may increase your risk of getting an infection. Call your care team for advice if you get a fever, chills, sore throat, or other symptoms of a cold or flu. Do not treat yourself. Try to avoid being around people who are sick. Avoid taking medications that contain aspirin, acetaminophen, ibuprofen, naproxen, or ketoprofen unless instructed by your care team. These medications may hide a fever. Be careful brushing or flossing your teeth or using a toothpick because you may get an infection or bleed more easily. If you have any dental work done, tell your dentist you are receiving this medication. This medication may cause dry eyes and blurred vision. If you wear contact lenses, you may feel  some discomfort. Lubricating eye drops may help. See your care team if the problem does not go away or is severe. Talk to your care team if you may be pregnant. Serious birth defects can occur if you take this medication during pregnancy and for 7 months after the last dose. If your partner can get pregnant, use a condom during sex while taking this medication and for 4 months after the last dose. Do not breastfeed while taking this medication and for 7 months after the last dose. This medication may cause infertility. Talk to your care team if you are concerned about your fertility. What side effects may I notice from receiving this medication? Side effects that you should report to your care team as soon as possible: Allergic reactions--skin rash, itching, hives, swelling of the face, lips, tongue, or throat Dry cough, shortness of breath or trouble breathing Infection--fever, chills, cough, sore throat, wounds that don't heal, pain or trouble when passing urine, general feeling of discomfort or being unwell Heart failure--shortness of breath, swelling of the ankles, feet, or hands, sudden weight gain, unusual weakness or fatigue Unusual bruising or bleeding Side effects that usually do not require medical attention (report these to your care  team if they continue or are bothersome): Constipation Diarrhea Hair loss Muscle pain Nausea Vomiting This list may not describe all possible side effects. Call your doctor for medical advice about side effects. You may report side effects to FDA at 1-800-FDA-1088. Where should I keep my medication? This medication is given in a hospital or clinic. It will not be stored at home. NOTE: This sheet is a summary. It may not cover all possible information. If you have questions about this medicine, talk to your doctor, pharmacist, or health care provider.  2024 Elsevier/Gold Standard (2023-02-04 00:00:00)

## 2024-03-14 NOTE — Progress Notes (Signed)
 [facility] Spiritual Care Note  Saw Chelsea Hansen briefly in [facility] breast center. Canceled Friday's [facility] appointment per her request; will phone to reschedule.  [address] [name], [place], [facility] Pager [phone] Voicemail [phone]

## 2024-03-14 NOTE — Assessment & Plan Note (Signed)
 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  Hospitalization: 12/22/2023-01/17/2024: Intractable nausea vomiting (PEG tube: Venting gastrostomy, J-tube) Hospitalization 02/01/2024-02/06/2024: Intractable pain, nausea vomiting ---------------------------------------------------------------------------------------------------------------------- 06/10/2023: PET/CT: Interval development of hypermetabolic lymphadenopathy left submandibular region, throughout mediastinal, bilateral hilar, upper abdomen (no uptake in the bones)  02/02/2024: CT abdomen and pelvis: Pancreatic mass, right hydronephrosis, peritoneal carcinomatosis, bone metastases L3 Peritoneal biopsy 02/03/2024: Metastatic breast cancer, ER 100%, PR 0%, HER2 2+   Treatment plan: Enhertu  every 3 weeks started 02/16/2024 Hospitalization 03/04/2024-03/09/2024: Abdominal wall abscess 03/05/2024: CT abdomen and pelvis: Improved abscess 1.8 cm, dilation of extrahepatic bile duct, omental infiltration with peritoneal carcinomatosis, diffuse bone metastases

## 2024-03-14 NOTE — Progress Notes (Signed)
 Patient Care Team: [name], NP as PCP - General (Internal Medicine) [name], DO as PCP - Cardiology (Cardiology) [name], MD as Consulting Physician (Radiation Oncology) [name], MD as Consulting Physician (General Surgery) [name], MD as Consulting Physician (Hematology and Oncology) [name], [name], NP as Nurse Practitioner (Nurse Practitioner) [name], NP as Nurse Practitioner (Hematology and Oncology)  DIAGNOSIS:  Encounter Diagnosis  Name Primary?   Malignant neoplasm of upper-outer quadrant of right breast in female, estrogen receptor positive (HCC) Yes    SUMMARY OF ONCOLOGIC HISTORY: Oncology History  Malignant neoplasm of upper-outer quadrant of right breast in female, estrogen receptor positive (HCC)  12/12/2020 Initial Diagnosis   status post bilateral breast biopsies 12/03/2020, showing             (1) on the right, a clinical T2 N1, stage IIa invasive lobular carcinoma, grade 2, estrogen and progesterone receptor strongly positive, HER2 not amplified, with an MIB-1 of 40%                         (a) the biopsied right axillary lymph node was positive with extracapsular extension                         (b) a second right breast mass also biopsied was a fibroadenoma, concordant  MAMMAPRINT tested on biopsy returned high risk, luminal type B, indicating significant benefit from chemo                         (c) biopsy of an area of non-mass-like enhancement in the upper right breast pending   12/17/2020 Cancer Staging   Staging form: Breast, AJCC 8th Edition - Clinical stage from 12/17/2020: Stage IIA (cT2, cN1, cM0, G2, ER+, PR+, HER2-) - Signed by [name], NP on 01/14/2022 Stage prefix: Initial diagnosis Histologic grading system: 3 grade system Laterality: Right Staged by: Pathologist and managing physician Stage used in treatment planning: Yes National guidelines used in treatment  planning: Yes Type of national guideline used in treatment planning: NCCN   12/24/2020 Genetic Testing   Negative genetic testing:  No pathogenic variants detected on the [employer] BRCAplus panel (report date 12/24/2020) or the CancerNext-Expanded + RNAinsight panel (report date 12/31/2020). 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   The BRCAplus panel offered by [employer] and includes sequencing and deletion/duplication analysis for the following 8 genes: ATM, BRCA1, BRCA2, CDH1, CHEK2, PALB2, PTEN, and TP53. The CancerNext-Expanded + RNAinsight gene panel offered by [employer] and includes sequencing and rearrangement analysis for the following 77 genes: AIP, ALK, APC, ATM, AXIN2, BAP1, BARD1, BLM, BMPR1A, BRCA1, BRCA2, BRIP1, CDC73, CDH1, CDK4, CDKN1B, CDKN2A, CHEK2, CTNNA1, DICER1, FANCC, FH, FLCN, GALNT12, KIF1B, LZTR1, MAX, MEN1, MET, MLH1, MSH2, MSH3, MSH6, MUTYH, NBN, NF1, NF2, NTHL1, PALB2, PHOX2B, PMS2, POT1, PRKAR1A, PTCH1, PTEN, RAD51C, RAD51D, RB1, RECQL, RET, SDHA, SDHAF2, SDHB, SDHC, SDHD, SMAD4, SMARCA4, SMARCB1, SMARCE1, STK11, SUFU, TMEM127, TP53, TSC1, TSC2, VHL and XRCC2 (sequencing and deletion/duplication); EGFR, EGLN1, HOXB13, KIT, MITF, PDGFRA, POLD1 and POLE (sequencing only); EPCAM and GREM1 (deletion/duplication only). RNA data is routinely analyzed for use in variant interpretation for all genes.   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 Multifocal sclerotic bony metastatic disease greatest in the thoracic spine with scattered rib lesions and sclerosis as well. 2. No nodal or visceral metastasis about the chest. 3. Collateral pathways about the chest suggest some subclavian venous narrowing on the LEFT. This could also be related to arm position. Correlate clinically. 4. Signs of RIGHT breast lumpectomy. Potential postoperative changes in the LEFT breast with small focal area of nodularity in the lateral LEFT breast which could also be postoperative, correlate clinically and consider mammographic correlation as warranted. 5. Mild cardiac enlargement.   05/18/2022 Treatment Plan Change   Anastrozole  + Verzenio  100mg  PO BID   12/07/2022 Cancer Staging   Staging form: Breast, AJCC 8th Edition - Pathologic: Stage IV (cM1) - Signed by [name], NP on 12/07/2022   02/16/2024 -  Chemotherapy   Patient is on Treatment Plan : BREAST Fam-Trastuzumab Deruxtecan-nxki  (Enhertu ) (5.4) q21d     Breast cancer (HCC)  12/12/2020 Initial Diagnosis   on the left, a clinical T1b N0, stage IA invasive ductal carcinoma, grade 1 or 2, estrogen and progesterone receptor positive, HER2 not amplified, with an MIB 1 of 15%   12/17/2020 Cancer Staging   Staging form: Breast, AJCC 8th Edition - Clinical stage from 12/17/2020: Stage IA (cT1b, cN0, cM0, G2, ER+, PR+, HER2-) - Signed by [name], NP on 01/14/2022 Stage prefix: Initial  diagnosis Histologic grading system: 3 grade system Laterality: Left Staged by: Pathologist and managing physician Stage used in treatment planning: Yes National guidelines used in treatment planning: Yes Type of national guideline used in treatment planning: NCCN   12/24/2020 Genetic Testing   Negative genetic testing:  No pathogenic variants detected on the [facility] BRCAplus panel (report date 12/24/2020) or the CancerNext-Expanded + RNAinsight panel (report date 12/31/2020). 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   The BRCAplus panel offered by [employer] and includes sequencing and deletion/duplication analysis for the following 8 genes: ATM, BRCA1, BRCA2, CDH1, CHEK2, PALB2, PTEN, and TP53. The CancerNext-Expanded + RNAinsight gene panel offered by [employer] and includes sequencing and rearrangement analysis for the following 77 genes: AIP, ALK, APC, ATM, AXIN2, BAP1, BARD1, BLM, BMPR1A, BRCA1, BRCA2, BRIP1, CDC73, CDH1, CDK4, CDKN1B, CDKN2A, CHEK2, CTNNA1, DICER1, FANCC, FH, FLCN, GALNT12, KIF1B, LZTR1, MAX, MEN1, MET, MLH1, MSH2, MSH3, MSH6, MUTYH, NBN, NF1, NF2, NTHL1, PALB2, PHOX2B, PMS2, POT1, PRKAR1A, PTCH1, PTEN, RAD51C, RAD51D, RB1, RECQL, RET, SDHA, SDHAF2, SDHB, SDHC, SDHD, SMAD4, SMARCA4, SMARCB1, SMARCE1, STK11, SUFU, TMEM127, TP53, TSC1, TSC2, VHL and XRCC2 (sequencing and deletion/duplication); EGFR, EGLN1, HOXB13, KIT, MITF, PDGFRA, POLD1 and POLE (sequencing only); EPCAM and GREM1 (deletion/duplication only). RNA data is routinely analyzed  for use in variant interpretation for all genes.   01/27/2021 -  Neo-Adjuvant Chemotherapy   Neoadjuvant chemotherapy with Doxorubicin  and Cyclophosphamide  given x 4 beginning 01/27/2021 and completing on 03/10/2021 followed by weekly paclitaxel  x 12 beginning 03/24/2021   07/09/2021 Surgery   Right lumpectomy: Grade 2 invasive lobular cancer, 2.5 cm, LCIS, margins negative, LCIS focally at  anterior margin, 1/1 lymph node positive, ER 95%, PR 100%, HER2 negative, Ki-67 40% Left lumpectomy: High-grade DCIS: 0.7 cm, margins negative, 0/1 lymph node negative ER 95%, PR 100%   07/09/2021 Cancer Staging   Staging form: Breast, AJCC 8th Edition - Pathologic stage from 07/09/2021: No Stage Recommended (ypTis (DCIS), pN0, cM0) - Signed by [name], NP on 07/22/2021 Stage prefix: Post-therapy   07/2021 -  Radiation Therapy   Adjuvant radiation to follow surgery   11/2021 -  Anti-estrogen oral therapy   Letrozole  x 5-7 years; changed to anastrozole    05/13/2022 Imaging   CT chest  IMPRESSION: 1. Multifocal sclerotic bony metastatic disease greatest in the thoracic spine with scattered rib lesions and sclerosis as well. 2. No nodal or visceral metastasis about the chest. 3. Collateral pathways about the chest suggest some subclavian venous narrowing on the LEFT. This could also be related to arm position. Correlate clinically. 4. Signs of RIGHT breast lumpectomy. Potential postoperative changes in the LEFT breast with small focal area of nodularity in the lateral LEFT breast which could also be postoperative, correlate clinically and consider mammographic correlation as warranted. 5. Mild cardiac enlargement.   05/18/2022 Treatment Plan Change   Anastrozole  + Verzenio  100mg  PO BID   06/23/2022 - 07/07/2022 Radiation Therapy   06/23/2022 through 07/07/2022 Site Technique Total Dose (Gy) Dose per Fx (Gy) Completed Fx Beam Energies  Lumbar Spine: Spine_LS_Pelv 3D 30/30 3 10/10 15X       CHIEF COMPLIANT: Cycle 2 Enhertu   HISTORY OF PRESENT ILLNESS: History of Present Illness Chelsea Hansen is a 53 year old female with metastatic breast cancer who presents with nausea and abdominal pain.  She experiences persistent nausea and vomiting, which began on the way to the appointment. Her nausea medications, Zofran  and Compazine , were removed from her medication list during a  recent hospital stay, and she is concerned about this change as they previously helped manage her symptoms.  Abdominal pain persists and is managed with pain medication every four hours and a pain patch. The pain intensifies if she does not adhere to this schedule. During a recent hospitalization, she was informed of a liver issue and is awaiting further clarification.  Neuropathy has developed in her right foot, specifically in the toes, since her first treatment with Enhertu . There is no numbness in the entire foot. She questions whether this could be related to her previous chemotherapy treatments.  During a recent hospital stay, she was informed of an abscess infection and a persistent blockage in her bile duct. Attempts to bypass the blockage have been unsuccessful. Additionally, there are cancer cells in the omentum. She is currently receiving Enhertu  infusions.  She has bone metastases and receives Faslodex injections every three months. She is due for her next injection in September. All medications are taken orally, and she is not using her feeding tube.     ALLERGIES:  is allergic to enhertu  [fam-trastuzumab  deruxtec-nxki].  MEDICATIONS:  Current Outpatient Medications  Medication Sig Dispense Refill   acetaminophen  (TYLENOL ) 325 MG tablet Take 2 tablets (650 mg total) by mouth every 6 (six) hours as  needed for mild pain (pain score 1-3) or fever (or Fever >/= 101).     fentaNYL  (DURAGESIC ) 25 MCG/HR Place 1 patch onto the skin every 3 (three) days. 10 patch 0   lidocaine -prilocaine  (EMLA ) cream Apply to affected area once 30 g 3   ondansetron  (ZOFRAN ) 8 MG tablet Take 1 tablet (8 mg total) by mouth every 8 (eight) hours as needed for nausea or vomiting. Start on the third day after chemotherapy. 30 tablet 1   oxyCODONE  (ROXICODONE ) 15 MG immediate release tablet Take 1 tablet (15 mg total) by mouth every 4 (four) hours as needed for pain. 90 tablet 0   prochlorperazine  (COMPAZINE ) 10  MG tablet Take 1 tablet (10 mg total) by mouth every 6 (six) hours as needed for nausea or vomiting. 30 tablet 1   No current facility-administered medications for this visit.    PHYSICAL EXAMINATION: ECOG PERFORMANCE STATUS: 1 - Symptomatic but completely ambulatory  Vitals:   03/14/24 1431  BP: 119/81  Pulse: 84  Resp: 18  Temp: 98.3 F (36.8 C)  SpO2: 98%   Filed Weights   03/14/24 1431  Weight: 167 lb 8 oz (76 kg)    Physical Exam   (exam performed in the presence of a chaperone)  LABORATORY DATA:  I have reviewed the data as listed    Latest Ref Rng & Units 03/08/2024    6:06 AM 03/06/2024   10:00 AM 03/05/2024   12:10 PM  CMP  Glucose 70 - 99 mg/dL 90  885  899   BUN 6 - 20 mg/dL 11  10  15    Creatinine 0.44 - 1.00 mg/dL 9.40  9.13  9.27   Sodium 135 - 145 mmol/L 139  141  137   Potassium 3.5 - 5.1 mmol/L 4.1  4.1  3.8   Chloride 98 - 111 mmol/L 102  104  98   CO2 22 - 32 mmol/L 26  26  26    Calcium  8.9 - 10.3 mg/dL 9.0  8.8  8.9   Total Protein 6.5 - 8.1 g/dL 5.4  5.6    Total Bilirubin 0.0 - 1.2 mg/dL 0.3  0.3    Alkaline Phos 38 - 126 U/L 1,235  1,138    AST 15 - 41 U/L 46  70    ALT 0 - 44 U/L 50  52      Lab Results  Component Value Date   WBC 4.8 03/14/2024   HGB 8.9 (L) 03/14/2024   HCT 26.8 (L) 03/14/2024   MCV 79.1 (L) 03/14/2024   PLT 132 (L) 03/14/2024   NEUTROABS 2.7 03/14/2024    ASSESSMENT & PLAN:  Malignant neoplasm of upper-outer quadrant of right breast in female, estrogen receptor positive (HCC) 12/03/2020: Bilateral breast biopsies: Right breast: T2N1 stage IIa grade 2 ILC, ER/PR positive, HER2 negative, Ki-67 40%, right axillary lymph node positive with extracapsular extension MammaPrint: High risk   01/27/2021 -06/08/2021 neoadjuvant chemotherapy with dose dense Adriamycin  and Cytoxan  x4 followed by Taxol  x11   07/09/2021:Right lumpectomy: Grade 2 invasive lobular cancer, 2.5 cm, LCIS, margins negative, LCIS focally at anterior  margin, 1/1 lymph node positive, ER 95%, PR 100%, HER2 negative, Ki-67 40% 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  Hospitalization: 12/22/2023-01/17/2024: Intractable nausea vomiting (PEG tube: Venting gastrostomy, J-tube)  Hospitalization 02/01/2024-02/06/2024: Intractable pain, nausea vomiting ---------------------------------------------------------------------------------------------------------------------- 06/10/2023: PET/CT: Interval development of hypermetabolic lymphadenopathy left submandibular region, throughout mediastinal, bilateral hilar, upper abdomen (no uptake in the bones)  02/02/2024: CT abdomen and pelvis: Pancreatic mass, right hydronephrosis, peritoneal carcinomatosis, bone metastases L3 Peritoneal biopsy 02/03/2024: Metastatic breast cancer, ER 100%, PR 0%, HER2 2+   Treatment plan: Enhertu  every 3 weeks started 02/16/2024 Hospitalization 03/04/2024-03/09/2024: Abdominal wall abscess 03/05/2024: CT abdomen and pelvis: Improved abscess 1.8 cm, dilation of extrahepatic bile duct, omental infiltration with peritoneal carcinomatosis, diffuse bone metastases  Discussion: I discussed with the patient that even though it is a high risk for her to receive Enhertu , if she does not receive chemotherapy she is unlikely to ever improve the biliary blockage or the peritoneal carcinomatosis.  Therefore we are proceeding with today's treatment. She will also receive Xgeva  today.  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 - Continue Enhertu  infusion every three weeks. - Reassess after three and six treatments to  evaluate response. - Monitor for side effects and adjust treatment as necessary.  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 Enhertu  aims to reduce tumor burden and relieve obstruction. - Inability to do ERCP recently. Elevated alkaline phosphatase: Will be monitored   Abdominal pain related to malignancy Abdominal pain likely due to omental involvement of metastatic breast cancer. Pain management essential for quality of life. - Continue current pain management regimen with oral medications every four hours and a patch. - Monitor pain levels and adjust treatment as necessary.  Chemotherapy-induced nausea and vomiting Nausea and vomiting likely related to chemotherapy. Zofran  and Compazine  available, Zyprexa  reinstated for effective management. - Reinstate Zyprexa  for nausea management. - Continue Zofran  and Compazine  as needed. - Advise small, frequent meals to manage nausea.  Chemotherapy-induced peripheral neuropathy Peripheral neuropathy presenting as numbness in toes, primarily on the right side, possibly related to previous treatments.  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 Likely related to malignancy and/or chemotherapy. - No immediate intervention required unless hemoglobin levels decrease significantly.      No orders of the defined types were placed in this encounter.  The patient has a good understanding of the overall plan. she agrees with it. she will call with any problems that may develop before the next visit here. Total time spent: 30 mins including face to face time and time spent for planning, charting and co-ordination of care   Naomi MARLA Chad, MD 03/14/24

## 2024-03-14 NOTE — Progress Notes (Signed)
 Keep Enhertu  dose at 3.2 mg/kg today per Dr. Odean.  Roscoe Witts, Pharm.D., CPP 03/14/2024@3 :31 PM

## 2024-03-14 NOTE — Progress Notes (Signed)
 pt pref IV Diphenhydramine  today since she has some nausea.  OK to switch PO--> IV per Dr. Odean.  Jj Enyeart, Pharm.D., CPP 03/14/2024@3 :56 PM

## 2024-03-15 ENCOUNTER — Encounter: Payer: Self-pay | Admitting: General Practice

## 2024-03-15 NOTE — Progress Notes (Signed)
 [facility] Spiritual Care Note  Chelsea Hansen by phone, rescheduling her [facility] appointment to Monday 10/13 at 2:30 pm. Will continue to follow for spiritual and emotional support, and she knows to reach out anytime as desired.  [address] [name], [place], [facility] Pager [phone] Voicemail [phone]

## 2024-03-16 ENCOUNTER — Inpatient Hospital Stay: Payer: MEDICAID | Admitting: General Practice

## 2024-03-17 ENCOUNTER — Encounter: Payer: Self-pay | Admitting: Hematology and Oncology

## 2024-03-19 ENCOUNTER — Inpatient Hospital Stay (HOSPITAL_COMMUNITY)
Admission: AD | Admit: 2024-03-19 | Discharge: 2024-03-28 | DRG: 641 | Disposition: A | Discharge status: Home / Self Care | Payer: MEDICAID | Source: Ambulatory Visit | Attending: Internal Medicine | Admitting: Internal Medicine

## 2024-03-19 ENCOUNTER — Inpatient Hospital Stay: Payer: MEDICAID | Admitting: Physician Assistant

## 2024-03-19 ENCOUNTER — Other Ambulatory Visit: Payer: Self-pay

## 2024-03-19 ENCOUNTER — Observation Stay (HOSPITAL_COMMUNITY): Payer: MEDICAID

## 2024-03-19 ENCOUNTER — Telehealth: Payer: Self-pay

## 2024-03-19 ENCOUNTER — Inpatient Hospital Stay: Payer: MEDICAID

## 2024-03-19 ENCOUNTER — Encounter: Payer: Self-pay | Admitting: General Practice

## 2024-03-19 ENCOUNTER — Encounter (HOSPITAL_COMMUNITY): Payer: Self-pay

## 2024-03-19 ENCOUNTER — Encounter: Payer: Self-pay | Admitting: Hematology and Oncology

## 2024-03-19 ENCOUNTER — Encounter (HOSPITAL_COMMUNITY): Payer: Self-pay | Admitting: Internal Medicine

## 2024-03-19 VITALS — BP 114/74 | HR 64 | Temp 97.8°F | Resp 16

## 2024-03-19 VITALS — BP 96/67 | HR 67 | Temp 97.8°F | Resp 16 | Wt 157.1 lb

## 2024-03-19 DIAGNOSIS — R748 Abnormal levels of other serum enzymes: Secondary | ICD-10-CM

## 2024-03-19 DIAGNOSIS — R112 Nausea with vomiting, unspecified: Secondary | ICD-10-CM

## 2024-03-19 DIAGNOSIS — Z923 Personal history of irradiation: Secondary | ICD-10-CM

## 2024-03-19 DIAGNOSIS — Z8041 Family history of malignant neoplasm of ovary: Secondary | ICD-10-CM

## 2024-03-19 DIAGNOSIS — K838 Other specified diseases of biliary tract: Secondary | ICD-10-CM | POA: Diagnosis present

## 2024-03-19 DIAGNOSIS — E860 Dehydration: Secondary | ICD-10-CM

## 2024-03-19 DIAGNOSIS — C50411 Malignant neoplasm of upper-outer quadrant of right female breast: Secondary | ICD-10-CM

## 2024-03-19 DIAGNOSIS — F419 Anxiety disorder, unspecified: Secondary | ICD-10-CM | POA: Diagnosis present

## 2024-03-19 DIAGNOSIS — Z8049 Family history of malignant neoplasm of other genital organs: Secondary | ICD-10-CM

## 2024-03-19 DIAGNOSIS — I10 Essential (primary) hypertension: Secondary | ICD-10-CM | POA: Diagnosis present

## 2024-03-19 DIAGNOSIS — F32A Depression, unspecified: Secondary | ICD-10-CM | POA: Diagnosis present

## 2024-03-19 DIAGNOSIS — E876 Hypokalemia: Secondary | ICD-10-CM

## 2024-03-19 DIAGNOSIS — C786 Secondary malignant neoplasm of retroperitoneum and peritoneum: Secondary | ICD-10-CM | POA: Diagnosis present

## 2024-03-19 DIAGNOSIS — D6481 Anemia due to antineoplastic chemotherapy: Secondary | ICD-10-CM | POA: Diagnosis present

## 2024-03-19 DIAGNOSIS — R1110 Vomiting, unspecified: Secondary | ICD-10-CM | POA: Diagnosis present

## 2024-03-19 DIAGNOSIS — Z170 Estrogen receptor positive status [ER+]: Secondary | ICD-10-CM | POA: Diagnosis not present

## 2024-03-19 DIAGNOSIS — Z934 Other artificial openings of gastrointestinal tract status: Secondary | ICD-10-CM

## 2024-03-19 DIAGNOSIS — D630 Anemia in neoplastic disease: Secondary | ICD-10-CM | POA: Diagnosis present

## 2024-03-19 DIAGNOSIS — C7951 Secondary malignant neoplasm of bone: Secondary | ICD-10-CM | POA: Diagnosis present

## 2024-03-19 DIAGNOSIS — L89152 Pressure ulcer of sacral region, stage 2: Secondary | ICD-10-CM | POA: Diagnosis not present

## 2024-03-19 DIAGNOSIS — Z853 Personal history of malignant neoplasm of breast: Secondary | ICD-10-CM

## 2024-03-19 DIAGNOSIS — Z90710 Acquired absence of both cervix and uterus: Secondary | ICD-10-CM

## 2024-03-19 DIAGNOSIS — K219 Gastro-esophageal reflux disease without esophagitis: Secondary | ICD-10-CM | POA: Diagnosis present

## 2024-03-19 DIAGNOSIS — T451X5A Adverse effect of antineoplastic and immunosuppressive drugs, initial encounter: Secondary | ICD-10-CM | POA: Diagnosis present

## 2024-03-19 DIAGNOSIS — J45909 Unspecified asthma, uncomplicated: Secondary | ICD-10-CM | POA: Diagnosis present

## 2024-03-19 DIAGNOSIS — G893 Neoplasm related pain (acute) (chronic): Secondary | ICD-10-CM | POA: Diagnosis present

## 2024-03-19 DIAGNOSIS — Z79899 Other long term (current) drug therapy: Secondary | ICD-10-CM

## 2024-03-19 DIAGNOSIS — R197 Diarrhea, unspecified: Secondary | ICD-10-CM | POA: Diagnosis not present

## 2024-03-19 DIAGNOSIS — K315 Obstruction of duodenum: Secondary | ICD-10-CM | POA: Diagnosis present

## 2024-03-19 DIAGNOSIS — Z803 Family history of malignant neoplasm of breast: Secondary | ICD-10-CM

## 2024-03-19 DIAGNOSIS — R9431 Abnormal electrocardiogram [ECG] [EKG]: Secondary | ICD-10-CM | POA: Diagnosis present

## 2024-03-19 DIAGNOSIS — K869 Disease of pancreas, unspecified: Secondary | ICD-10-CM | POA: Diagnosis present

## 2024-03-19 DIAGNOSIS — E785 Hyperlipidemia, unspecified: Secondary | ICD-10-CM | POA: Diagnosis present

## 2024-03-19 DIAGNOSIS — K8689 Other specified diseases of pancreas: Secondary | ICD-10-CM | POA: Diagnosis present

## 2024-03-19 DIAGNOSIS — Z888 Allergy status to other drugs, medicaments and biological substances status: Secondary | ICD-10-CM

## 2024-03-19 DIAGNOSIS — Z8249 Family history of ischemic heart disease and other diseases of the circulatory system: Secondary | ICD-10-CM

## 2024-03-19 DIAGNOSIS — K5900 Constipation, unspecified: Secondary | ICD-10-CM | POA: Diagnosis present

## 2024-03-19 DIAGNOSIS — Z86718 Personal history of other venous thrombosis and embolism: Secondary | ICD-10-CM

## 2024-03-19 DIAGNOSIS — R7989 Other specified abnormal findings of blood chemistry: Secondary | ICD-10-CM | POA: Diagnosis present

## 2024-03-19 LAB — CMP (CANCER CENTER ONLY)
ALT: 130 U/L — ABNORMAL HIGH (ref 0–44)
AST: 126 U/L — ABNORMAL HIGH (ref 15–41)
Albumin: 3.4 g/dL — ABNORMAL LOW (ref 3.5–5.0)
Alkaline Phosphatase: 1529 U/L — ABNORMAL HIGH (ref 38–126)
Anion gap: 11 (ref 5–15)
BUN: 8 mg/dL (ref 6–20)
CO2: 23 mmol/L (ref 22–32)
Calcium: 4.5 mg/dL — CL (ref 8.9–10.3)
Chloride: 103 mmol/L (ref 98–111)
Creatinine: 0.41 mg/dL — ABNORMAL LOW (ref 0.44–1.00)
GFR, Estimated: >60 mL/min (ref >60)
Glucose, Bld: 89 mg/dL (ref 70–99)
Potassium: 3.3 mmol/L — ABNORMAL LOW (ref 3.5–5.1)
Sodium: 137 mmol/L (ref 135–145)
Total Bilirubin: 0.6 mg/dL (ref 0.0–1.2)
Total Protein: 6.4 g/dL — ABNORMAL LOW (ref 6.5–8.1)

## 2024-03-19 LAB — CBC WITH DIFFERENTIAL (CANCER CENTER ONLY)
Abs Immature Granulocytes: 0.14 K/uL — ABNORMAL HIGH (ref 0.00–0.07)
Basophils Absolute: 0 K/uL (ref 0.0–0.1)
Basophils Relative: 1 %
Eosinophils Absolute: 0 K/uL (ref 0.0–0.5)
Eosinophils Relative: 0 %
HCT: 26.1 % — ABNORMAL LOW (ref 36.0–46.0)
Hemoglobin: 9 g/dL — ABNORMAL LOW (ref 12.0–15.0)
Immature Granulocytes: 3 %
Lymphocytes Relative: 24 %
Lymphs Abs: 1 K/uL (ref 0.7–4.0)
MCH: 26.8 pg (ref 26.0–34.0)
MCHC: 34.5 g/dL (ref 30.0–36.0)
MCV: 77.7 fL — ABNORMAL LOW (ref 80.0–100.0)
Monocytes Absolute: 0.2 K/uL (ref 0.1–1.0)
Monocytes Relative: 4 %
Neutro Abs: 2.8 K/uL (ref 1.7–7.7)
Neutrophils Relative %: 68 %
Platelet Count: 143 K/uL — ABNORMAL LOW (ref 150–400)
RBC: 3.36 MIL/uL — ABNORMAL LOW (ref 3.87–5.11)
RDW: 17.4 % — ABNORMAL HIGH (ref 11.5–15.5)
WBC Count: 4.1 K/uL (ref 4.0–10.5)
nRBC: 0 % (ref 0.0–0.2)

## 2024-03-19 LAB — BASIC METABOLIC PANEL WITH GFR
Anion gap: 14 (ref 5–15)
BUN: 7 mg/dL (ref 6–20)
CO2: 18 mmol/L — ABNORMAL LOW (ref 22–32)
Calcium: 6.1 mg/dL — CL (ref 8.9–10.3)
Chloride: 103 mmol/L (ref 98–111)
Creatinine, Ser: 0.44 mg/dL (ref 0.44–1.00)
GFR, Estimated: >60 mL/min (ref >60)
Glucose, Bld: 141 mg/dL — ABNORMAL HIGH (ref 70–99)
Potassium: 3.8 mmol/L (ref 3.5–5.1)
Sodium: 135 mmol/L (ref 135–145)

## 2024-03-19 LAB — MAGNESIUM: Magnesium: 1 mg/dL — ABNORMAL LOW (ref 1.7–2.4)

## 2024-03-19 MED ORDER — MORPHINE SULFATE (PF) 2 MG/ML IV SOLN
2.0000 mg | Freq: Once | INTRAVENOUS | Status: AC
  Administered 2024-03-19: 2 mg via INTRAVENOUS
  Filled 2024-03-19: qty 1

## 2024-03-19 MED ORDER — POTASSIUM CHLORIDE 10 MEQ/100ML IV SOLN
10.0000 meq | Freq: Once | INTRAVENOUS | Status: AC
  Administered 2024-03-19: 10 meq via INTRAVENOUS
  Filled 2024-03-19: qty 100

## 2024-03-19 MED ORDER — SODIUM CHLORIDE 0.9 % IV SOLN
1.0000 mg/kg/h | INTRAVENOUS | Status: DC
  Administered 2024-03-19 – 2024-03-20 (×2): 1 mg/kg/h via INTRAVENOUS
  Filled 2024-03-19 (×4): qty 100

## 2024-03-19 MED ORDER — ACETAMINOPHEN 325 MG PO TABS: 650.0000 mg | ORAL_TABLET | Freq: Four times a day (QID) | ORAL | Status: DC | PRN

## 2024-03-19 MED ORDER — MAGNESIUM SULFATE 2 GM/50ML IV SOLN
2.0000 g | Freq: Once | INTRAVENOUS | Status: AC
  Administered 2024-03-19: 2 g via INTRAVENOUS
  Filled 2024-03-19: qty 50

## 2024-03-19 MED ORDER — OXYCODONE HCL 5 MG PO TABS
15.0000 mg | ORAL_TABLET | ORAL | Status: DC | PRN
  Administered 2024-03-20 – 2024-03-25 (×6): 15 mg via ORAL
  Filled 2024-03-19 (×9): qty 3

## 2024-03-19 MED ORDER — DEXAMETHASONE SODIUM PHOSPHATE 10 MG/ML IJ SOLN
10.0000 mg | Freq: Once | INTRAMUSCULAR | Status: AC
  Administered 2024-03-19: 10 mg via INTRAVENOUS
  Filled 2024-03-19: qty 1

## 2024-03-19 MED ORDER — ENOXAPARIN SODIUM 40 MG/0.4ML IJ SOSY
40.0000 mg | PREFILLED_SYRINGE | INTRAMUSCULAR | Status: DC
  Administered 2024-03-19 – 2024-03-28 (×8): 40 mg via SUBCUTANEOUS
  Filled 2024-03-19 (×8): qty 0.4

## 2024-03-19 MED ORDER — ACETAMINOPHEN 650 MG RE SUPP: 650.0000 mg | Freq: Four times a day (QID) | RECTAL | Status: DC | PRN

## 2024-03-19 MED ORDER — FENTANYL 25 MCG/HR TD PT72
1.0000 | MEDICATED_PATCH | TRANSDERMAL | Status: DC
  Administered 2024-03-20 – 2024-03-26 (×3): 1 via TRANSDERMAL
  Filled 2024-03-19 (×3): qty 1

## 2024-03-19 MED ORDER — SODIUM CHLORIDE 0.9% FLUSH
3.0000 mL | Freq: Two times a day (BID) | INTRAVENOUS | Status: DC
  Administered 2024-03-19 – 2024-03-28 (×11): 3 mL via INTRAVENOUS

## 2024-03-19 MED ORDER — TRIMETHOBENZAMIDE HCL 100 MG/ML IM SOLN
200.0000 mg | Freq: Four times a day (QID) | INTRAMUSCULAR | Status: DC | PRN
  Administered 2024-03-19 – 2024-03-28 (×16): 200 mg via INTRAMUSCULAR
  Filled 2024-03-19 (×22): qty 2

## 2024-03-19 MED ORDER — ONDANSETRON HCL 4 MG/2ML IJ SOLN
8.0000 mg | Freq: Once | INTRAMUSCULAR | Status: AC
  Administered 2024-03-19: 8 mg via INTRAVENOUS
  Filled 2024-03-19: qty 4

## 2024-03-19 MED ORDER — HYDROMORPHONE HCL 1 MG/ML IJ SOLN
1.0000 mg | INTRAMUSCULAR | Status: DC | PRN
  Administered 2024-03-19 – 2024-03-28 (×32): 1 mg via INTRAVENOUS
  Filled 2024-03-19 (×32): qty 1

## 2024-03-19 MED ORDER — SODIUM CHLORIDE 0.9 % IV SOLN: Freq: Once | INTRAVENOUS | Status: AC

## 2024-03-19 MED ORDER — CALCIUM GLUCONATE-NACL 2-0.675 GM/100ML-% IV SOLN
2.0000 g | Freq: Once | INTRAVENOUS | Status: AC
  Administered 2024-03-19: 2000 mg via INTRAVENOUS
  Filled 2024-03-19: qty 100

## 2024-03-19 MED ORDER — SENNOSIDES-DOCUSATE SODIUM 8.6-50 MG PO TABS: 1.0000 | ORAL_TABLET | Freq: Every evening | ORAL | Status: DC | PRN

## 2024-03-19 NOTE — Progress Notes (Signed)
 Pt being directly admitted to Rm 1401 today from [facility]. This RN called report to Lauren RN at 1630.

## 2024-03-19 NOTE — Patient Instructions (Signed)
 Rehydration, Adult Rehydration is the replacement of fluids, salts, and minerals in the body (electrolytes) that are lost during dehydration. Dehydration is when there is not enough water or other fluids in the body. This happens when you lose more fluids than you take in. Common causes of dehydration include: Not drinking enough fluids. This can occur when you are ill or doing activities that require a lot of energy, especially in hot weather. Conditions that cause loss of water or other fluids. These include diarrhea, vomiting, sweating, and urinating a lot. Other illnesses, such as fever or infection. Certain medicines, such as those that remove excess fluid from the body (diuretics). Symptoms of mild or moderate dehydration may include thirst, dry lips and mouth, and dizziness. Symptoms of severe dehydration may include increased heart rate, confusion, fainting, and not urinating. In severe cases, you may need to get fluids through an IV at the hospital. For mild or moderate cases, you can usually rehydrate at home by drinking certain fluids as told by your health care provider. What are the risks? Your health care provider will talk with you about risks. Your health care provider will talk with you about risks. This may include taking in too much fluid (overhydration). This is rare. Overhydration can cause an imbalance of electrolytes in the body, kidney failure, or a decrease in salt (sodium) levels in the body. Supplies needed: You will need an oral rehydration solution (ORS) if your health care provider tells you to use one. This is a drink to treat dehydration. It can be found in pharmacies and retail stores. How to rehydrate Fluids Follow instructions from your health care provider about what to drink. The kind of fluid and the amount you should drink depend on your condition. In general, you should choose drinks that you prefer. If told by your health care provider, drink an ORS. Make an  ORS by following instructions on the package. Start by drinking small amounts, about  cup (120 mL) every 5-10 minutes. Slowly increase how much you drink until you have taken in the amount recommended by your health care provider. Drink enough clear fluids to keep your urine pale yellow. If you were told to drink an ORS, finish it first, then start slowly drinking other clear fluids. Drink fluids such as: Water. This includes sparkling and flavored water. Drinking only water can lead to having too little sodium in your body (hyponatremia). Follow the advice of your health care provider. Water from ice chips you suck on. Fruit juice with water added to it (diluted). Sports drinks. Hot or cold herbal teas. Broth-based soups. Milk or milk products. Food Follow instructions from your health care provider about what to eat while you rehydrate. Your health care provider may recommend that you slowly begin eating regular foods in small amounts. Eat foods that contain a healthy balance of electrolytes, such as bananas, oranges, potatoes, tomatoes, and spinach. Avoid foods that are greasy or contain a lot of sugar. In some cases, you may get nutrition through a feeding tube that is passed through your nose and into your stomach (nasogastric tube, or NG tube). This may be done if you have uncontrolled vomiting or diarrhea. Drinks to avoid  Certain drinks may make dehydration worse. While you rehydrate, avoid drinking alcohol. How to tell if you are recovering from dehydration You may be getting better if: You are urinating more often than before you started rehydrating. Your urine is pale yellow. Your energy level improves. You vomit less  often. You have diarrhea less often. Your appetite improves or returns to normal. You feel less dizzy or light-headed. Your skin tone and color start to look more normal. Follow these instructions at home: Take over-the-counter and prescription medicines only  as told by your health care provider. Do not take sodium tablets. Doing this can lead to having too much sodium in your body (hypernatremia). Contact a health care provider if: You continue to have symptoms of mild or moderate dehydration, such as: Thirst. Dry lips. Slightly dry mouth. Dizziness. Dark urine or less urine than normal. Muscle cramps. You continue to vomit or have diarrhea. Get help right away if: You have symptoms of dehydration that get worse. You have a fever. You have a severe headache. You have been vomiting and have problems, such as: Your vomiting gets worse or does not go away. Your vomit includes blood or green matter (bile). You cannot eat or drink without vomiting. You have problems with urination or bowel movements, such as: Diarrhea that gets worse or does not go away. Blood in your stool (feces). This may cause stool to look black and tarry. Not urinating, or urinating only a small amount of very dark urine, within 6-8 hours. You have trouble breathing. You have symptoms that get worse with treatment. These symptoms may be an emergency. Get help right away. Call 911. Do not wait to see if the symptoms will go away. Do not drive yourself to the hospital. This information is not intended to replace advice given to you by your health care provider. Make sure you discuss any questions you have with your health care provider. Document Revised: 10/19/2021 Document Reviewed: 10/19/2021 Elsevier Patient Education  2024 [employer].

## 2024-03-19 NOTE — Progress Notes (Addendum)
 Symptom Management Consult Note [facility]    Patient Care Team: [name], NP as PCP - General (Internal Medicine) [name], DO as PCP - Cardiology (Cardiology) [name], MD as Consulting Physician (Radiation Oncology) [name], MD as Consulting Physician (General Surgery) [name], MD as Consulting Physician (Hematology and Oncology) [name], [name], NP as Nurse Practitioner (Nurse Practitioner) [name], NP as Nurse Practitioner (Hematology and Oncology)    Name / MRN / DOB: Chelsea Hansen  [id number]  03-26-1971   Date of visit: 03/19/2024   Chief Complaint/Reason for visit: nausea, vomiting, diarrhea   Current Therapy: Enhertu   Last treatment:  Day 1   Cycle 2 on 03/14/24    ASSESSMENT AND PLAN Patient is a 53 y.o. female with oncologic history of malignant neoplasm of upper-outer quadrant of right breast in female, estrogen receptor positive followed by Dr. Odean.  I have viewed most recent oncology note and lab work.  #Malignant neoplasm of upper-outer quadrant of right breast in female, estrogen receptor positive  - Next appointment with oncologist is 03/21/24 - 2 mg IV morphine  administered for abdominal pain.   #Electrolyte derangement - Calcium  4.5, magnesium  1.0, potassium 3.3 - Also has acutely elevated liver enzymes compared to labs x 9 days ago AST/ALT 126/130 (29/23) and worsening alk phos 1529 (1,492). - Derangements likely related to treatment. - Chart review shows history of progressive peritoneal carcinomatosis and per general surgery patient is not currently a surgical candidate. She had recent imaging CT AP 03/05/24 and MR abdomen 03/07/24. - Will start to replace electrolyte in clinic. 10 mEq IV potassium and 4g IV magnesium  ordered. - Patient given zofran  8 mg on arrival for significant nausea. After further investigation pt had prolonged QT on last EKG 9/15 511. EKG obtained  in clinic today shows QT 494. QT prolongation added to diagnosis list. Patient later reported nausea improved however still present. Per pharmacy will give decadron  10 mg IV for nausea. - Patient still needs calcium  replacement however we do not have cardiac monitoring capability in clinic, will need replacement once inpatient. - Patient agreeable to direct admission for electrolyte derangement. Spoke with Dr. Sebastian with hospitalist service who agrees to assume care of patient and bring into the hospital for further evaluation and management.     Oncologist agreeable with plan.     HEME/ONC HISTORY Oncology History  Malignant neoplasm of upper-outer quadrant of right breast in female, estrogen receptor positive (HCC)  12/12/2020 Initial Diagnosis   status post bilateral breast biopsies 12/03/2020, showing             (1) on the right, a clinical T2 N1, stage IIa invasive lobular carcinoma, grade 2, estrogen and progesterone receptor strongly positive, HER2 not amplified, with an MIB-1 of 40%                         (a) the biopsied right axillary lymph node was positive with extracapsular extension                         (b) a second right breast mass also biopsied was a fibroadenoma, concordant  MAMMAPRINT tested on biopsy returned high risk, luminal type B, indicating significant benefit from chemo                         (c) biopsy of an area  of non-mass-like enhancement in the upper right breast pending   12/17/2020 Cancer Staging   Staging form: Breast, AJCC 8th Edition - Clinical stage from 12/17/2020: Stage IIA (cT2, cN1, cM0, G2, ER+, PR+, HER2-) - Signed by [name], NP on 01/14/2022 Stage prefix: Initial diagnosis Histologic grading system: 3 grade system Laterality: Right Staged by: Pathologist and managing physician Stage used in treatment planning: Yes National guidelines used in treatment planning: Yes Type of national guideline used in treatment planning:  NCCN   12/24/2020 Genetic Testing   Negative genetic testing:  No pathogenic variants detected on the [employer] BRCAplus panel (report date 12/24/2020) or the CancerNext-Expanded + RNAinsight panel (report date 12/31/2020). 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   The BRCAplus panel offered by [employer] and includes sequencing and deletion/duplication analysis for the following 8 genes: ATM, BRCA1, BRCA2, CDH1, CHEK2, PALB2, PTEN, and TP53. The CancerNext-Expanded + RNAinsight gene panel offered by [employer] and includes sequencing and rearrangement analysis for the following 77 genes: AIP, ALK, APC, ATM, AXIN2, BAP1, BARD1, BLM, BMPR1A, BRCA1, BRCA2, BRIP1, CDC73, CDH1, CDK4, CDKN1B, CDKN2A, CHEK2, CTNNA1, DICER1, FANCC, FH, FLCN, GALNT12, KIF1B, LZTR1, MAX, MEN1, MET, MLH1, MSH2, MSH3, MSH6, MUTYH, NBN, NF1, NF2, NTHL1, PALB2, PHOX2B, PMS2, POT1, PRKAR1A, PTCH1, PTEN, RAD51C, RAD51D, RB1, RECQL, RET, SDHA, SDHAF2, SDHB, SDHC, SDHD, SMAD4, SMARCA4, SMARCB1, SMARCE1, STK11, SUFU, TMEM127, TP53, TSC1, TSC2, VHL and XRCC2 (sequencing and deletion/duplication); EGFR, EGLN1, HOXB13, KIT, MITF, PDGFRA, POLD1 and POLE (sequencing only); EPCAM and GREM1 (deletion/duplication only). RNA data is routinely analyzed for use in variant interpretation for all genes.   01/27/2021 -  Neo-Adjuvant Chemotherapy   Neoadjuvant chemotherapy with Doxorubicin  and Cyclophosphamide  given x 4 beginning 01/27/2021 and completing on 03/10/2021 followed by weekly paclitaxel  x 12 beginning 03/24/2021   07/09/2021 Surgery   Right lumpectomy: Grade 2 invasive lobular cancer, 2.5 cm, LCIS, margins negative, LCIS focally at anterior margin, 1/1 lymph node positive, ER 95%, PR 100%, HER2 negative, Ki-67 40% Left lumpectomy: High-grade DCIS: 0.7 cm, margins negative, 0/1 lymph node negative ER 95%, PR 100%   07/09/2021 Cancer Staging   Staging form: Breast, AJCC 8th Edition -  Pathologic stage from 07/09/2021: No Stage Recommended (ypT2, pN1a, cM0, G2) - Signed by [name], NP on 07/22/2021 Stage prefix: Post-therapy Histologic grading system: 3 grade system   07/2021 -  Radiation Therapy   Adjuvant radiation to follow surgery   11/2021 -  Anti-estrogen oral therapy   Letrozole  x 5-7 years; changed to anastrozole    05/13/2022 Imaging   CT chest  IMPRESSION: 1. Multifocal sclerotic bony metastatic disease greatest in the thoracic spine with scattered rib lesions and sclerosis as well. 2. No nodal or visceral metastasis about the chest. 3. Collateral pathways about the chest suggest some subclavian venous narrowing on the LEFT. This could also be related to arm position. Correlate clinically. 4. Signs of RIGHT breast lumpectomy. Potential postoperative changes in the LEFT breast with small focal area of nodularity in the lateral LEFT breast which could also be postoperative, correlate clinically and consider mammographic correlation as warranted. 5. Mild cardiac enlargement.   05/18/2022 Treatment Plan Change   Anastrozole  + Verzenio  100mg  PO BID   12/07/2022 Cancer Staging   Staging form: Breast, AJCC 8th Edition - Pathologic: Stage IV (cM1) - Signed by [name], NP on 12/07/2022   02/16/2024 -  Chemotherapy   Patient is on Treatment Plan : BREAST  Fam-Trastuzumab Deruxtecan-nxki  (Enhertu ) (5.4) q21d     Breast cancer (HCC)  12/12/2020 Initial Diagnosis   on the left, a clinical T1b N0, stage IA invasive ductal carcinoma, grade 1 or 2, estrogen and progesterone receptor positive, HER2 not amplified, with an MIB 1 of 15%   12/17/2020 Cancer Staging   Staging form: Breast, AJCC 8th Edition - Clinical stage from 12/17/2020: Stage IA (cT1b, cN0, cM0, G2, ER+, PR+, HER2-) - Signed by [name], NP on 01/14/2022 Stage prefix: Initial diagnosis Histologic grading system: 3 grade system Laterality: Left Staged by:  Pathologist and managing physician Stage used in treatment planning: Yes National guidelines used in treatment planning: Yes Type of national guideline used in treatment planning: NCCN   12/24/2020 Genetic Testing   Negative genetic testing:  No pathogenic variants detected on the [employer] BRCAplus panel (report date 12/24/2020) or the CancerNext-Expanded + RNAinsight panel (report date 12/31/2020). 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   The BRCAplus panel offered by [employer] and includes sequencing and deletion/duplication analysis for the following 8 genes: ATM, BRCA1, BRCA2, CDH1, CHEK2, PALB2, PTEN, and TP53. The CancerNext-Expanded + RNAinsight gene panel offered by [employer] and includes sequencing and rearrangement analysis for the following 77 genes: AIP, ALK, APC, ATM, AXIN2, BAP1, BARD1, BLM, BMPR1A, BRCA1, BRCA2, BRIP1, CDC73, CDH1, CDK4, CDKN1B, CDKN2A, CHEK2, CTNNA1, DICER1, FANCC, FH, FLCN, GALNT12, KIF1B, LZTR1, MAX, MEN1, MET, MLH1, MSH2, MSH3, MSH6, MUTYH, NBN, NF1, NF2, NTHL1, PALB2, PHOX2B, PMS2, POT1, PRKAR1A, PTCH1, PTEN, RAD51C, RAD51D, RB1, RECQL, RET, SDHA, SDHAF2, SDHB, SDHC, SDHD, SMAD4, SMARCA4, SMARCB1, SMARCE1, STK11, SUFU, TMEM127, TP53, TSC1, TSC2, VHL and XRCC2 (sequencing and deletion/duplication); EGFR, EGLN1, HOXB13, KIT, MITF, PDGFRA, POLD1 and POLE (sequencing only); EPCAM and GREM1 (deletion/duplication only). RNA data is routinely analyzed for use in variant interpretation for all genes.   01/27/2021 -  Neo-Adjuvant Chemotherapy   Neoadjuvant chemotherapy with Doxorubicin  and Cyclophosphamide  given x 4 beginning 01/27/2021 and completing on 03/10/2021 followed by weekly paclitaxel  x 12 beginning 03/24/2021   07/09/2021 Surgery   Right lumpectomy: Grade 2 invasive lobular cancer, 2.5 cm, LCIS, margins negative, LCIS focally at anterior margin, 1/1 lymph node positive, ER 95%, PR 100%, HER2 negative, Ki-67  40% Left lumpectomy: High-grade DCIS: 0.7 cm, margins negative, 0/1 lymph node negative ER 95%, PR 100%   07/09/2021 Cancer Staging   Staging form: Breast, AJCC 8th Edition - Pathologic stage from 07/09/2021: No Stage Recommended (ypTis (DCIS), pN0, cM0) - Signed by [name], NP on 07/22/2021 Stage prefix: Post-therapy   07/2021 -  Radiation Therapy   Adjuvant radiation to follow surgery   11/2021 -  Anti-estrogen oral therapy   Letrozole  x 5-7 years; changed to anastrozole    05/13/2022 Imaging   CT chest  IMPRESSION: 1. Multifocal sclerotic bony metastatic disease greatest in the thoracic spine with scattered rib lesions and sclerosis as well. 2. No nodal or visceral metastasis about the chest. 3. Collateral pathways about the chest suggest some subclavian venous narrowing on the LEFT. This could also be related to arm position. Correlate clinically. 4. Signs of RIGHT breast lumpectomy. Potential postoperative changes in the LEFT breast with small focal area of nodularity in the lateral LEFT breast which could also be postoperative, correlate clinically and consider mammographic correlation as warranted. 5. Mild cardiac enlargement.   05/18/2022 Treatment Plan Change   Anastrozole  + Verzenio  100mg  PO BID   06/23/2022 - 07/07/2022 Radiation Therapy  06/23/2022 through 07/07/2022 Site Technique Total Dose (Gy) Dose per Fx (Gy) Completed Fx Beam Energies  Lumbar Spine: Spine_LS_Pelv 3D 30/30 3 10/10 15X         INTERVAL HISTORY  Discussed the use of AI scribe software for clinical note transcription with the patient, who gave verbal consent to proceed.    YATZIRI WAINWRIGHT is a 53 y.o. female with oncologic history as above presenting to [facility] today with chief complaint of nausea and vomiting. Patient presents unaccompanied to visit today.  She experiences persistent nausea and vomiting, occurring four to five times daily, with emesis described as 'brown stuff' and not  containing blood. She has been unable to eat and has not taken any nausea medications due to vomiting x 3 days. The nausea has been severe since her last treatment.  She experiences ongoing abdominal pain, described as 'hurting real bad' across the entire upper abdomen. Pain medications, which previously provided relief, are now ineffective as she vomits them as well. She reports abdominal pain feels like it always does.  She reports new onset diarrhea since yesterday, described as runny and dark brown in color. She has not taken any medication for diarrhea and has not used Imodium in the past. She denies fever or chills. Her current medications include Zofran  and Compazine  for nausea, oncologist mentioned restarting Zyprexa  in last visit however patient reports it was not sent to her pharmacy.   ROS  All other systems are reviewed and are negative for acute change except as noted in the HPI.    Allergies  Allergen Reactions   Enhertu  [Fam-Trastuzumab  Deruxtec-Nxki] Other (See Comments)    Headache and chest pain. See progress note from 02/16/2024. Patient able to complete infusion.     Past Medical History:  Diagnosis Date   Allergy    seasonal allergies   Anxiety    on meds   Asthma    uses inhaler   Breast cancer (HCC) 2012   RIGHT lumpectomy   Cancer (HCC) 2022   RIGHT breast lump-dx 2022   Depression    on meds   DVT (deep venous thrombosis) (HCC) 2010   after hysterectomy   Family history of uterine cancer    GERD (gastroesophageal reflux disease)    with certain foods/OTC PRN meds   Headache(784.0)    History of radiation therapy    Bilateral breast- 09/03/21-11/02/21- Dr. Lynwood Nasuti   Hyperlipidemia    on meds   Hypertension    on meds   SVT (supraventricular tachycardia)      Past Surgical History:  Procedure Laterality Date   ABDOMINAL HYSTERECTOMY     AXILLARY SENTINEL NODE BIOPSY Left 07/09/2021   Procedure: LEFT AXILLARY SENTINEL NODE BIOPSY;   Surgeon: [name], MD;  Location: [facility];  Service: General;  Laterality: Left;   BALLOON DILATION N/A 12/13/2023   Procedure: MERRILL HODGKIN;  Surgeon: Federico Rosario BROCKS, MD;  Location: [facility] ENDOSCOPY;  Service: Gastroenterology;  Laterality: N/A;   BIOPSY  06/29/2022   Procedure: BIOPSY;  Surgeon: Legrand Victory LITTIE DOUGLAS, MD;  Location: [facility] ENDOSCOPY;  Service: Gastroenterology;;   BIOPSY OF SKIN SUBCUTANEOUS TISSUE AND/OR MUCOUS MEMBRANE  12/13/2023   Procedure: BIOPSY, SKIN, SUBCUTANEOUS TISSUE, OR MUCOUS MEMBRANE;  Surgeon: Federico Rosario BROCKS, MD;  Location: [facility] ENDOSCOPY;  Service: Gastroenterology;;   BREAST EXCISIONAL BIOPSY Right 09/16/2009   BREAST LUMPECTOMY WITH RADIOACTIVE SEED LOCALIZATION Bilateral 07/09/2021   Procedure: BILATERAL BREAST LUMPECTOMY WITH RADIOACTIVE SEED LOCALIZATION;  Surgeon:  [name], MD;  Location: [facility];  Service: General;  Laterality: Bilateral;   BREAST SURGERY     lumpectomy   ESOPHAGOGASTRODUODENOSCOPY N/A 12/13/2023   Procedure: EGD (ESOPHAGOGASTRODUODENOSCOPY);  Surgeon: Federico Rosario BROCKS, MD;  Location: [facility] ENDOSCOPY;  Service: Gastroenterology;  Laterality: N/A;   ESOPHAGOGASTRODUODENOSCOPY N/A 12/26/2023   Procedure: EGD (ESOPHAGOGASTRODUODENOSCOPY);  Surgeon: Wilhelmenia Aloha Raddle., MD;  Location: [facility];  Service: Gastroenterology;  Laterality: N/A;   ESOPHAGOGASTRODUODENOSCOPY N/A 12/27/2023   Procedure: EGD (ESOPHAGOGASTRODUODENOSCOPY);  Surgeon: Wilhelmenia Aloha Raddle., MD;  Location: [facility];  Service: Gastroenterology;  Laterality: N/A;   ESOPHAGOGASTRODUODENOSCOPY N/A 03/08/2024   Procedure: EGD (ESOPHAGOGASTRODUODENOSCOPY);  Surgeon: Wilhelmenia Aloha Raddle., MD;  Location: [facility];  Service: Gastroenterology;  Laterality: N/A;   ESOPHAGOGASTRODUODENOSCOPY (EGD) WITH PROPOFOL  N/A 06/29/2022   Procedure: ESOPHAGOGASTRODUODENOSCOPY (EGD) WITH PROPOFOL ;  Surgeon: Legrand Victory LITTIE DOUGLAS, MD;   Location: [facility];  Service: Gastroenterology;  Laterality: N/A;   EUS N/A 12/26/2023   Procedure: ULTRASOUND, UPPER GI TRACT, ENDOSCOPIC;  Surgeon: Wilhelmenia Aloha Raddle., MD;  Location: [facility];  Service: Gastroenterology;  Laterality: N/A;   EUS N/A 12/27/2023   Procedure: ULTRASOUND, UPPER GI TRACT, ENDOSCOPIC;  Surgeon: Wilhelmenia Aloha Raddle., MD;  Location: [facility];  Service: Gastroenterology;  Laterality: N/A;   EUS N/A 03/08/2024   Procedure: ULTRASOUND, UPPER GI TRACT, ENDOSCOPIC;  Surgeon: Wilhelmenia Aloha Raddle., MD;  Location: [facility];  Service: Gastroenterology;  Laterality: N/A;   IR CM INJ ANY COLONIC TUBE W/FLUORO  01/20/2024   IR IMAGING GUIDED PORT INSERTION  02/06/2024   IR REPLC DUODEN/JEJUNO TUBE PERCUT W/FLUORO  02/03/2024   IR REPLC GASTRO/COLONIC TUBE PERCUT W/FLUORO  01/20/2024   LAPAROSCOPIC INSERTION GASTROSTOMY TUBE N/A 01/10/2024   Procedure: laparoscopic insertion of gastrostomy tube, laparoscopic insertion of jejunostomy tube, upper endoscopy;  Surgeon: Kinsinger, Herlene Righter, MD;  Location: [facility];  Service: General;  Laterality: N/A;  Laproscopic insertion   LAPAROSCOPIC ROUX-EN-Y GASTRIC BYPASS WITH HIATAL HERNIA REPAIR N/A 01/02/2024   Procedure: LAPAROSCOPIC CREATION GASTRJEJONOSTOMY;  Surgeon: Lyndel Deward PARAS, MD;  Location: [facility];  Service: General;  Laterality: N/A;  GASTROJEJUNOSTOMY   PORT-A-CATH REMOVAL Left 07/09/2021   Procedure: REMOVAL PORT-A-CATH;  Surgeon: [name], MD;  Location: [facility];  Service: General;  Laterality: Left;   PORTACATH PLACEMENT Left 01/26/2021   Procedure: INSERTION PORT-A-CATH;  Surgeon: [name], MD;  Location: [facility];  Service: General;  Laterality: Left;   RADIOACTIVE SEED GUIDED AXILLARY SENTINEL LYMPH NODE Right 07/09/2021   Procedure: RADIOACTIVE SEED GUIDED RIGHT AXILLARY SENTINEL LYMPH NODE DISSECTION;  Surgeon: [name], MD;  Location: [facility];  Service: General;  Laterality: Right;   TUBAL LIGATION      Social History   Socioeconomic History   Marital status: Single    Spouse name: Not on file   Number of children: Not on file   Years of education: Not on file   Highest education level: Not on file  Occupational History   Not on file  Tobacco Use   Smoking status: Never   Smokeless tobacco: Never  Vaping Use   Vaping status: Never Used  Substance and Sexual Activity   Alcohol use: Not Currently    Alcohol/week: 7.0 standard drinks of alcohol    Types: 7 Standard drinks or equivalent per week    Comment: occassional   Drug use: No   Sexual activity: Not Currently  Other Topics Concern   Not on file  Social History Narrative   Not on file   Social Drivers of Health   Financial Resource Strain: High Risk (02/13/2024)   Overall Financial Resource Strain (CARDIA)    Difficulty of Paying Living Expenses: Hard  Food Insecurity: No Food Insecurity (03/05/2024)   Hunger Vital Sign    Worried About Running Out of Food in the Last Year: Never true    Ran Out of Food in the Last Year: Never true  Recent Concern: Food Insecurity - Food Insecurity Present (02/16/2024)   Hunger Vital Sign    Worried About Running Out of Food in the Last Year: Sometimes true    Ran Out of Food in the Last Year: Sometimes true  Transportation Needs: No Transportation Needs (03/05/2024)   PRAPARE - Administrator, Civil Service (Medical): No    Lack of Transportation (Non-Medical): No  Physical Activity: Inactive (02/13/2024)   Exercise Vital Sign    Days of Exercise per Week: 0 days    Minutes of Exercise per Session: 0 min  Stress: No Stress Concern Present (02/13/2024)   [employer] of Occupational Health - Occupational Stress Questionnaire    Feeling of Stress: Not at all  Social Connections: Moderately Isolated (02/13/2024)   Social Connection and Isolation Panel    Frequency of Communication with Friends and  Family: More than three times a week    Frequency of Social Gatherings with Friends and Family: Once a week    Attends Religious Services: 1 to 4 times per year    Active Member of [employer] or Organizations: No    Attends Banker Meetings: Never    Marital Status: Never married  Intimate Partner Violence: Not At Risk (03/05/2024)   Humiliation, Afraid, Rape, and Kick questionnaire    Fear of Current or Ex-Partner: No    Emotionally Abused: No    Physically Abused: No    Sexually Abused: No    Family History  Problem Relation Age of Onset   Breast cancer Mother        28s   Hypertension Father    Uterine cancer Maternal Aunt    Ovarian cancer Maternal Aunt    Breast cancer Maternal Aunt        80s   Breast cancer Cousin 68   Uterine cancer Cousin 50   Colon polyps Neg Hx    Colon cancer Neg Hx    Esophageal cancer Neg Hx    Stomach cancer Neg Hx      Current Outpatient Medications:    acetaminophen  (TYLENOL ) 325 MG tablet, Take 2 tablets (650 mg total) by mouth every 6 (six) hours as needed for mild pain (pain score 1-3) or fever (or Fever >/= 101)., Disp: , Rfl:    fentaNYL  (DURAGESIC ) 25 MCG/HR, Place 1 patch onto the skin every 3 (three) days., Disp: 10 patch, Rfl: 0   lidocaine -prilocaine  (EMLA ) cream, Apply to affected area once, Disp: 30 g, Rfl: 3   ondansetron  (ZOFRAN ) 8 MG tablet, Take 1 tablet (8 mg total) by mouth every 8 (eight) hours as needed for nausea or vomiting. Start on the third day after chemotherapy., Disp: 30 tablet, Rfl: 1   oxyCODONE  (ROXICODONE ) 15 MG immediate release tablet, Take 1 tablet (15 mg total) by mouth every 4 (four) hours as needed for pain., Disp: 90 tablet, Rfl: 0   prochlorperazine  (COMPAZINE ) 10 MG tablet, Take 1 tablet (10 mg total) by mouth every 6 (six) hours as needed for nausea or  vomiting., Disp: 30 tablet, Rfl: 1 No current facility-administered medications for this visit.  Facility-Administered Medications Ordered in  Other Visits:    magnesium  sulfate IVPB 2 g 50 mL, 2 g, Intravenous, Once, Walisiewicz, Bess Saltzman E, PA-C, Last Rate: 50 mL/hr at 03/19/24 1609, 2 g at 03/19/24 1609  PHYSICAL EXAM ECOG FS:1 - Symptomatic but completely ambulatory    Vitals:   03/19/24 1256 03/19/24 1330 03/19/24 1410  BP: 92/67 99/63 114/74  Pulse: 80  64  Resp: 15  16  Temp: 97.8 F (36.6 C)    TempSrc: Temporal    SpO2: 100%  100%   Physical Exam Vitals and nursing note reviewed.  Constitutional:      Appearance: She is not ill-appearing or toxic-appearing.  HENT:     Head: Normocephalic.     Mouth/Throat:     Mouth: Mucous membranes are dry.  Eyes:     Conjunctiva/sclera: Conjunctivae normal.  Cardiovascular:     Rate and Rhythm: Normal rate and regular rhythm.     Pulses: Normal pulses.     Heart sounds: Normal heart sounds.  Pulmonary:     Effort: Pulmonary effort is normal.  Abdominal:     General: There is no distension.     Palpations: Abdomen is soft.     Tenderness: There is abdominal tenderness. There is no guarding or rebound.     Comments: Diffuse abdominal tenderness  Musculoskeletal:     Cervical back: Normal range of motion.  Skin:    General: Skin is warm and dry.  Neurological:     Mental Status: She is alert.  Psychiatric:        Mood and Affect: Affect is tearful.        LABORATORY DATA I have reviewed the data as listed    Latest Ref Rng & Units 03/19/2024   12:32 PM 03/14/2024    2:06 PM 03/09/2024    5:50 AM  CBC  WBC 4.0 - 10.5 K/uL 4.1  4.8  4.3   Hemoglobin 12.0 - 15.0 g/dL 9.0  8.9  8.5   Hematocrit 36.0 - 46.0 % 26.1  26.8  26.8   Platelets 150 - 400 K/uL 143  132  110         Latest Ref Rng & Units 03/19/2024   12:32 PM 03/14/2024    2:06 PM 03/08/2024    6:06 AM  CMP  Glucose 70 - 99 mg/dL 89  90  90   BUN 6 - 20 mg/dL 8  6  11    Creatinine 0.44 - 1.00 mg/dL 9.58  9.52  9.40   Sodium 135 - 145 mmol/L 137  138  139   Potassium 3.5 - 5.1 mmol/L 3.3  3.5   4.1   Chloride 98 - 111 mmol/L 103  105  102   CO2 22 - 32 mmol/L 23  28  26    Calcium  8.9 - 10.3 mg/dL 4.5  8.7  9.0   Total Protein 6.5 - 8.1 g/dL 6.4  6.0  5.4   Total Bilirubin 0.0 - 1.2 mg/dL 0.6  0.5  0.3   Alkaline Phos 38 - 126 U/L 1,529  1,492  1,235   AST 15 - 41 U/L 126  29  46   ALT 0 - 44 U/L 130  23  50        RADIOGRAPHIC STUDIES (from last 24 hours if applicable) I have personally reviewed the radiological images as listed and agreed with  the findings in the report. No results found.      Visit Diagnosis: 1. Malignant neoplasm of upper-outer quadrant of right breast in female, estrogen receptor positive (HCC)   2. Hypomagnesemia   3. Hypokalemia   4. Hypocalcemia   5. Elevated liver enzymes   6. Nausea and vomiting, unspecified vomiting type   7. Prolonged Q-T interval on ECG      Orders Placed This Encounter  Procedures   EKG 12-Lead    All questions were answered. The patient knows to call the clinic with any problems, questions or concerns. No barriers to learning was detected.  A total of more than 40 minutes were spent on this encounter with face-to-face time and non-face-to-face time, including preparing to see the patient, ordering tests and/or medications, counseling the patient and coordination of care as outlined above.    Thank you for allowing me to participate in the care of this patient.    Elesha Thedford E  Walisiewicz, PA-C Department of Hematology/Oncology [facility] at [facility] Phone: [phone]  [phone]    03/19/2024 4:20 PM

## 2024-03-19 NOTE — Progress Notes (Signed)
 [facility] Spiritual Care Note  Visited with Ms Azzaro in [facility] per referral from RN due to emotional distress regarding another hospital admission.  Additionally, Ms Weir requested a Bible for her hospital stay; chaplain brought a care package of spiritual reflection aids and art supplies for additional support.  Provided compassionate presence and reflective listening as Ms Stiggers processed her disappointment about facing another hospital stay. She observed, I keep having to go to the hospital after every chemo. This isn't LIFE, and, I don't know if I can keep doing chemo.  Chaplain affirmed that Ms Richins has a say in her own treatment plan, including deciding about whether/how long to continue treatment. She is interested in having a Goals of Care conversation to address her concerns, symptom burden, hospital admission cycle, and projected treatment outcomes.  If possible, Ms Balash notes that she would particularly appreciate having this conference with Levon [name]/NP from the [facility] Palliative team. Chaplain reported this to Palliative team in multidisciplinary rounds today.  Chaplain will continue to follow for spiritual and emotional support.  [address] [name], [place], [facility] Pager [phone] Voicemail [phone]

## 2024-03-19 NOTE — Progress Notes (Signed)
 CRITICAL VALUE STICKER  CRITICAL VALUE: Calcium  4.5 mg/dL  RECEIVER (on-site recipient of call): Elenor Blush RN  DATE & TIME NOTIFIED: 1326 on 03/19/2024  MESSENGER (representative from lab): Powell Beat  MD NOTIFIED: Mallie Combes PA-C  TIME OF NOTIFICATION: 1327 on 03/19/2024

## 2024-03-19 NOTE — Telephone Encounter (Signed)
 S/w patient regarding ongoing n/v/d. Patient reports having n/v and unable to keep anything down. Patient reports that antiemetics are not helping anymore. Pt also having new onset of diarrhea starting yesterday. Patient has not had any loose stools as of today. Patient last had enhertu  treatment on 03/14/24. Patient has not taken anything to help with diarrhea. Patient encouraged to try OTC imodium to help alleviate any loose stools. Reviewed how to take medication. Patient also encouraged to check for fever. Patient reports that she does not have any way of checking her temperature. Patient offered to be brought into the clinic for further evaluation and possible IVF. Patient agreeable if she is able to arrange transportation. Patient will call daughter and call office back to let us  know if she is able to be brought in.  [facility] contacted to check availability.

## 2024-03-19 NOTE — H&P (Signed)
 History and Physical    Chelsea Hansen FMW:[id number] DOB: January 05, 1971 DOA: 03/19/2024  PCP: [name], NP   Patient coming from: Home   Chief Complaint: N/V/D, electrolyte derangements   HPI: Chelsea Hansen is a 53 y.o. 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  Patient reports recent worsening in her nausea and vomiting despite taking antiemetics at home.  She also had diarrhea yesterday, but not today.  She was seen at the cancer center today for evaluation of this, and was found to have serum calcium  4.5, magnesium  1.0, potassium 3.3, and prolonged QT interval.  She was treated with 10 mill equivalents IV potassium, 4 g IV magnesium , Zofran , Decadron , and morphine  at the cancer center.  Direct admission was arranged for treatment of her severe hypocalcemia.   Review of Systems:  All other systems reviewed and apart from HPI, are negative.  Past Medical History:  Diagnosis Date   Allergy    seasonal allergies   Anxiety    on meds   Asthma    uses inhaler   Breast cancer (HCC) 2012   RIGHT lumpectomy   Cancer (HCC) 2022   RIGHT breast lump-dx 2022   Depression    on meds   DVT (deep venous thrombosis) (HCC) 2010   after hysterectomy   Family history of uterine cancer    GERD (gastroesophageal reflux disease)    with certain foods/OTC PRN meds   Headache(784.0)    History of radiation therapy    Bilateral breast- 09/03/21-11/02/21- Dr. Lynwood Nasuti   Hyperlipidemia    on meds   Hypertension    on meds   SVT (supraventricular tachycardia)     Past Surgical History:  Procedure Laterality Date   ABDOMINAL HYSTERECTOMY     AXILLARY SENTINEL NODE BIOPSY Left 07/09/2021   Procedure: LEFT  AXILLARY SENTINEL NODE BIOPSY;  Surgeon: [name], MD;  Location: [facility];  Service: General;  Laterality: Left;   BALLOON DILATION N/A 12/13/2023   Procedure: MERRILL HODGKIN;  Surgeon: Federico Rosario BROCKS, MD;  Location: [facility];  Service: Gastroenterology;  Laterality: N/A;   BIOPSY  06/29/2022   Procedure: BIOPSY;  Surgeon: Legrand Victory LITTIE DOUGLAS, MD;  Location: [facility];  Service: Gastroenterology;;   BIOPSY OF SKIN SUBCUTANEOUS TISSUE AND/OR MUCOUS MEMBRANE  12/13/2023   Procedure: BIOPSY, SKIN, SUBCUTANEOUS TISSUE, OR MUCOUS MEMBRANE;  Surgeon: Federico Rosario BROCKS, MD;  Location: [facility] ENDOSCOPY;  Service: Gastroenterology;;   BREAST EXCISIONAL BIOPSY Right 09/16/2009   BREAST LUMPECTOMY WITH RADIOACTIVE SEED LOCALIZATION Bilateral 07/09/2021   Procedure: BILATERAL BREAST LUMPECTOMY WITH RADIOACTIVE SEED LOCALIZATION;  Surgeon: [name], MD;  Location: [facility];  Service: General;  Laterality: Bilateral;   BREAST SURGERY     lumpectomy   ESOPHAGOGASTRODUODENOSCOPY N/A 12/13/2023   Procedure: EGD (ESOPHAGOGASTRODUODENOSCOPY);  Surgeon: Federico Rosario BROCKS, MD;  Location: [facility];  Service: Gastroenterology;  Laterality: N/A;   ESOPHAGOGASTRODUODENOSCOPY N/A 12/26/2023   Procedure: EGD (ESOPHAGOGASTRODUODENOSCOPY);  Surgeon: Wilhelmenia Aloha Raddle., MD;  Location: [facility];  Service: Gastroenterology;  Laterality: N/A;   ESOPHAGOGASTRODUODENOSCOPY N/A 12/27/2023   Procedure: EGD (ESOPHAGOGASTRODUODENOSCOPY);  Surgeon: Wilhelmenia Aloha Raddle., MD;  Location: [facility];  Service: Gastroenterology;  Laterality: N/A;   ESOPHAGOGASTRODUODENOSCOPY N/A 03/08/2024   Procedure: EGD (ESOPHAGOGASTRODUODENOSCOPY);  Surgeon: Wilhelmenia Aloha  Mickey., MD;  Location: [facility];  Service: Gastroenterology;  Laterality: N/A;   ESOPHAGOGASTRODUODENOSCOPY (EGD) WITH PROPOFOL  N/A 06/29/2022   Procedure: ESOPHAGOGASTRODUODENOSCOPY (EGD) WITH PROPOFOL ;  Surgeon:  Legrand Victory LITTIE DOUGLAS, MD;  Location: [facility];  Service: Gastroenterology;  Laterality: N/A;   EUS N/A 12/26/2023   Procedure: ULTRASOUND, UPPER GI TRACT, ENDOSCOPIC;  Surgeon: Wilhelmenia Aloha Mickey., MD;  Location: [facility];  Service: Gastroenterology;  Laterality: N/A;   EUS N/A 12/27/2023   Procedure: ULTRASOUND, UPPER GI TRACT, ENDOSCOPIC;  Surgeon: Wilhelmenia Aloha Mickey., MD;  Location: [facility];  Service: Gastroenterology;  Laterality: N/A;   EUS N/A 03/08/2024   Procedure: ULTRASOUND, UPPER GI TRACT, ENDOSCOPIC;  Surgeon: Wilhelmenia Aloha Mickey., MD;  Location: [facility];  Service: Gastroenterology;  Laterality: N/A;   IR CM INJ ANY COLONIC TUBE W/FLUORO  01/20/2024   IR IMAGING GUIDED PORT INSERTION  02/06/2024   IR REPLC DUODEN/JEJUNO TUBE PERCUT W/FLUORO  02/03/2024   IR REPLC GASTRO/COLONIC TUBE PERCUT W/FLUORO  01/20/2024   LAPAROSCOPIC INSERTION GASTROSTOMY TUBE N/A 01/10/2024   Procedure: laparoscopic insertion of gastrostomy tube, laparoscopic insertion of jejunostomy tube, upper endoscopy;  Surgeon: Kinsinger, Herlene Righter, MD;  Location: [facility];  Service: General;  Laterality: N/A;  Laproscopic insertion   LAPAROSCOPIC ROUX-EN-Y GASTRIC BYPASS WITH HIATAL HERNIA REPAIR N/A 01/02/2024   Procedure: LAPAROSCOPIC CREATION GASTRJEJONOSTOMY;  Surgeon: Lyndel Deward PARAS, MD;  Location: [facility];  Service: General;  Laterality: N/A;  GASTROJEJUNOSTOMY   PORT-A-CATH REMOVAL Left 07/09/2021   Procedure: REMOVAL PORT-A-CATH;  Surgeon: [name], MD;  Location: [facility];  Service: General;  Laterality: Left;   PORTACATH PLACEMENT Left 01/26/2021   Procedure: INSERTION PORT-A-CATH;  Surgeon: [name], MD;  Location: [facility];  Service: General;  Laterality: Left;   RADIOACTIVE SEED GUIDED AXILLARY SENTINEL LYMPH NODE Right 07/09/2021   Procedure: RADIOACTIVE SEED GUIDED RIGHT AXILLARY SENTINEL LYMPH NODE DISSECTION;  Surgeon: [name], MD;  Location:  [facility];  Service: General;  Laterality: Right;   TUBAL LIGATION      Social History:   reports that she has never smoked. She has never used smokeless tobacco. She reports that she does not currently use alcohol after a past usage of about 7.0 standard drinks of alcohol per week. She reports that she does not use drugs.  Allergies  Allergen Reactions   Enhertu  [Fam-Trastuzumab  Deruxtec-Nxki] Other (See Comments)    Headache and chest pain. See progress note from 02/16/2024. Patient able to complete infusion.    Family History  Problem Relation Age of Onset   Breast cancer Mother        91s   Hypertension Father    Uterine cancer Maternal Aunt    Ovarian cancer Maternal Aunt    Breast cancer Maternal Aunt        67s   Breast cancer Cousin 71   Uterine cancer Cousin 62   Colon polyps Neg Hx    Colon cancer Neg Hx    Esophageal cancer Neg Hx    Stomach cancer Neg Hx      Prior to Admission medications   Medication Sig Start Date End Date Taking? Authorizing Provider  acetaminophen  (TYLENOL ) 325 MG tablet Take 2 tablets (650 mg total) by mouth every 6 (six) hours as needed for mild pain (pain score 1-3) or fever (or Fever >/= 101). 03/09/24  Yes Rosario Eland I, MD  fentaNYL  (DURAGESIC ) 25 MCG/HR Place 1 patch onto the skin every 3 (three) days.  03/11/24  Yes Rosario Leatrice FERNS, MD  lidocaine -prilocaine  (EMLA ) cream Apply to affected area once Patient taking differently: Apply 1 Application topically daily as needed (for port). 02/08/24  Yes [name], [name], MD  ondansetron  (ZOFRAN ) 8 MG tablet Take 1 tablet (8 mg total) by mouth every 8 (eight) hours as needed for nausea or vomiting. Start on the third day after chemotherapy. 02/08/24  Yes [name], [name], MD  oxyCODONE  (ROXICODONE ) 15 MG immediate release tablet Take 1 tablet (15 mg total) by mouth every 4 (four) hours as needed for pain. 03/12/24  Yes [name], [name], NP  prochlorperazine  (COMPAZINE ) 10  MG tablet Take 1 tablet (10 mg total) by mouth every 6 (six) hours as needed for nausea or vomiting. 02/08/24  Yes [name], MD    Physical Exam: Vitals:   03/19/24 1729  BP: 124/78  Pulse: 69  Resp: 18  Temp: 98.4 F (36.9 C)  TempSrc: Oral  SpO2: 100%  Height: 5' 11 (1.803 m)    Constitutional: NAD, calm  Eyes: PERTLA, lids and conjunctivae normal ENMT: Mucous membranes are moist. Posterior pharynx clear of any exudate or lesions.   Neck: supple, no masses  Respiratory: no wheezing, no crackles. No accessory muscle use.  Cardiovascular: S1 & S2 heard, regular rate and rhythm. No JVD. Abdomen: Soft, tender in upper abdomen. Bowel sounds active.  Musculoskeletal: no clubbing / cyanosis. No joint deformity upper and lower extremities.   Skin: no significant rashes, lesions, ulcers. Warm, dry, well-perfused. Neurologic: CN 2-12 grossly intact. Moving all extremities. Alert and oriented.  Psychiatric: Pleasant. Cooperative.    Labs and Imaging on Admission: I have personally reviewed following labs and imaging studies  CBC: Recent Labs  Lab 03/14/24 1406 03/19/24 1232  WBC 4.8 4.1  NEUTROABS 2.7 2.8  HGB 8.9* 9.0*  HCT 26.8* 26.1*  MCV 79.1* 77.7*  PLT 132* 143*   Basic Metabolic Panel: Recent Labs  Lab 03/14/24 1406 03/19/24 1232  NA 138 137  K 3.5 3.3*  CL 105 103  CO2 28 23  GLUCOSE 90 89  BUN 6 8  CREATININE 0.47 0.41*  CALCIUM  8.7* 4.5*  MG  --  1.0*   GFR: Estimated Creatinine Clearance: 93 mL/min (A) (by C-G formula based on SCr of 0.41 mg/dL (L)). Liver Function Tests: Recent Labs  Lab 03/14/24 1406 03/19/24 1232  AST 29 126*  ALT 23 130*  ALKPHOS 1,492* 1,529*  BILITOT 0.5 0.6  PROT 6.0* 6.4*  ALBUMIN  3.1* 3.4*   No results for input(s): LIPASE, AMYLASE in the last 168 hours. No results for input(s): AMMONIA in the last 168 hours. Coagulation Profile: No results for input(s): INR, PROTIME in the last 168 hours. Cardiac  Enzymes: No results for input(s): CKTOTAL, CKMB, CKMBINDEX, TROPONINI in the last 168 hours. BNP (last 3 results) No results for input(s): PROBNP in the last 8760 hours. HbA1C: No results for input(s): HGBA1C in the last 72 hours. CBG: No results for input(s): GLUCAP in the last 168 hours. Lipid Profile: No results for input(s): CHOL, HDL, LDLCALC, TRIG, CHOLHDL, LDLDIRECT in the last 72 hours. Thyroid  Function Tests: No results for input(s): TSH, T4TOTAL, FREET4, T3FREE, THYROIDAB in the last 72 hours. Anemia Panel: No results for input(s): VITAMINB12, FOLATE, FERRITIN, TIBC, IRON, RETICCTPCT in the last 72 hours. Urine analysis:    Component Value Date/Time   COLORURINE YELLOW 02/22/2024 1223   APPEARANCEUR CLEAR 02/22/2024 1223   LABSPEC 1.015 02/22/2024 1223   PHURINE 6.0 02/22/2024 1223  GLUCOSEU NEGATIVE 02/22/2024 1223   HGBUR NEGATIVE 02/22/2024 1223   BILIRUBINUR NEGATIVE 02/22/2024 1223   KETONESUR 5 (A) 02/22/2024 1223   PROTEINUR NEGATIVE 02/22/2024 1223   UROBILINOGEN 0.2 11/01/2013 1246   NITRITE NEGATIVE 02/22/2024 1223   LEUKOCYTESUR NEGATIVE 02/22/2024 1223   Sepsis Labs: @LABRCNTIP (procalcitonin:4,lacticidven:4) )No results found for this or any previous visit (from the past 240 hours).   Radiological Exams on Admission: No results found.  EKG: Independently reviewed. Sinus rhythm, QTc 501 ms.   Assessment/Plan   1. Hypocalcemia; hypomagnesemia, hypokalemia  - She was given IV potassium and IV magnesium  at [facility] just prior to admission  - Repeat chemistries and continue potassium and magnesium  supplementation as indicated, give 2 g IV calcium  gluconate now followed by calcium  gluconate infusion, follow serial calcium  levels, transition to oral calcium  and vit D once she is tolerating oral intake    2. Nausea, vomiting, and diarrhea  - No more diarrhea today, continues to have N/V but eager to try  eating again  - Continue IVF hydration, continue antiemetics, monitor/correct electrolytes, trial clears and advance diet as tolerated    3. Elevated LFTs; pancreatic head lesion with duodenal obstruction s/p G tube  - Alk phos and transaminases are increased, bilirubin is normal  - Check US , trend LFTs    4. Breast cancer with metastasis; chronic cancer-related pain   - Receiving Enhertu  under the care of Dr. Odean   - Continue pain-control    DVT prophylaxis: Lovenox   Code Status: Full  Level of Care: Level of care: Progressive Family Communication: None present   Disposition Plan:  Patient is from: Home  Anticipated d/c is to: TBD Anticipated d/c date is: 03/21/24  Patient currently: Pending electrolyte replacement, tolerance of adequate oral intake  Consults called: None Admission status: Observation     Evalene GORMAN Sprinkles, MD [facility]  03/19/2024, 8:01 PM

## 2024-03-19 NOTE — Care Plan (Signed)
 Plan of Care Note for accepted transfer   Patient: Chelsea Hansen MRN: [id number]   DOA: (Not on file)  Facility requesting transfer: [facility] Requesting Provider: Kaitlyn Walisiewicz, PA/Dr. Odean Reason for transfer: Severe electrolyte abnormality Facility course: Patient is an unfortunate 53 year old female history of metastatic right breast cancer estrogen receptor positive on chemotherapy/ehertu every 3 weeks started 02/16/2024 with last treatment 03/14/2024 and also received Xgeva , history of pancreatic mass, peritoneal carcinomatosis, bone mets recent hospitalization 03/04/2024 -03/09/2024 for abdominal wall abscess presenting to oncology office with a 2 to 3-day history of nausea vomiting with inability to keep anything down despite antiemetics, also new onset diarrhea which started 1 day prior to admission.  Patient seen at oncology center, vital signs noted to be 114/74 with a pulse of 64, temp of 97 8 respiratory rate 16. Lab work done with comprehensive metabolic profile with a potassium of 3.3, calcium  of 4.5, magnesium  of 1.0, alk phosphatase of 1529, AST of 126, ALT of 130, protein of 6.4.  CBC done with a hemoglobin of 9, platelet count of 143 otherwise within normal limits.  Hospitalist called for admission for further evaluation and management.  Plan of care: The patient is accepted for admission to Progressive unit, at [facility]..   Author: Toribio Hummer, MD 03/19/2024  Check [email or web address] for on-call coverage.  Nursing staff, Please call [facility] Admits & Consults System-Wide number on Amion as soon as patient's arrival, so appropriate admitting provider can evaluate the pt.

## 2024-03-20 ENCOUNTER — Telehealth: Payer: Self-pay | Admitting: *Deleted

## 2024-03-20 ENCOUNTER — Encounter: Payer: Self-pay | Admitting: General Practice

## 2024-03-20 ENCOUNTER — Other Ambulatory Visit: Payer: Self-pay

## 2024-03-20 ENCOUNTER — Inpatient Hospital Stay (HOSPITAL_COMMUNITY): Payer: MEDICAID

## 2024-03-20 ENCOUNTER — Observation Stay (HOSPITAL_COMMUNITY): Payer: MEDICAID

## 2024-03-20 DIAGNOSIS — Z934 Other artificial openings of gastrointestinal tract status: Secondary | ICD-10-CM | POA: Diagnosis not present

## 2024-03-20 DIAGNOSIS — R9431 Abnormal electrocardiogram [ECG] [EKG]: Secondary | ICD-10-CM | POA: Diagnosis present

## 2024-03-20 DIAGNOSIS — E878 Other disorders of electrolyte and fluid balance, not elsewhere classified: Secondary | ICD-10-CM | POA: Diagnosis present

## 2024-03-20 DIAGNOSIS — K838 Other specified diseases of biliary tract: Secondary | ICD-10-CM | POA: Diagnosis present

## 2024-03-20 DIAGNOSIS — F32A Depression, unspecified: Secondary | ICD-10-CM | POA: Diagnosis present

## 2024-03-20 DIAGNOSIS — D630 Anemia in neoplastic disease: Secondary | ICD-10-CM | POA: Diagnosis present

## 2024-03-20 DIAGNOSIS — C786 Secondary malignant neoplasm of retroperitoneum and peritoneum: Secondary | ICD-10-CM | POA: Diagnosis present

## 2024-03-20 DIAGNOSIS — Z923 Personal history of irradiation: Secondary | ICD-10-CM | POA: Diagnosis not present

## 2024-03-20 DIAGNOSIS — C7951 Secondary malignant neoplasm of bone: Secondary | ICD-10-CM | POA: Diagnosis present

## 2024-03-20 DIAGNOSIS — Z86718 Personal history of other venous thrombosis and embolism: Secondary | ICD-10-CM | POA: Diagnosis not present

## 2024-03-20 DIAGNOSIS — J45909 Unspecified asthma, uncomplicated: Secondary | ICD-10-CM | POA: Diagnosis present

## 2024-03-20 DIAGNOSIS — K315 Obstruction of duodenum: Secondary | ICD-10-CM | POA: Diagnosis present

## 2024-03-20 DIAGNOSIS — I10 Essential (primary) hypertension: Secondary | ICD-10-CM | POA: Diagnosis present

## 2024-03-20 DIAGNOSIS — L89152 Pressure ulcer of sacral region, stage 2: Secondary | ICD-10-CM | POA: Diagnosis not present

## 2024-03-20 DIAGNOSIS — K5900 Constipation, unspecified: Secondary | ICD-10-CM | POA: Diagnosis present

## 2024-03-20 DIAGNOSIS — Z803 Family history of malignant neoplasm of breast: Secondary | ICD-10-CM | POA: Diagnosis not present

## 2024-03-20 DIAGNOSIS — E876 Hypokalemia: Secondary | ICD-10-CM | POA: Diagnosis present

## 2024-03-20 DIAGNOSIS — G893 Neoplasm related pain (acute) (chronic): Secondary | ICD-10-CM | POA: Diagnosis present

## 2024-03-20 DIAGNOSIS — R112 Nausea with vomiting, unspecified: Secondary | ICD-10-CM | POA: Diagnosis present

## 2024-03-20 DIAGNOSIS — T451X5A Adverse effect of antineoplastic and immunosuppressive drugs, initial encounter: Secondary | ICD-10-CM | POA: Diagnosis present

## 2024-03-20 DIAGNOSIS — R197 Diarrhea, unspecified: Secondary | ICD-10-CM | POA: Diagnosis not present

## 2024-03-20 DIAGNOSIS — E785 Hyperlipidemia, unspecified: Secondary | ICD-10-CM | POA: Diagnosis present

## 2024-03-20 DIAGNOSIS — Z8249 Family history of ischemic heart disease and other diseases of the circulatory system: Secondary | ICD-10-CM | POA: Diagnosis not present

## 2024-03-20 LAB — BASIC METABOLIC PANEL WITH GFR
Anion gap: 13 (ref 5–15)
Anion gap: 15 (ref 5–15)
Anion gap: 13 (ref 5–15)
Anion gap: 13 (ref 5–15)
BUN: 7 mg/dL (ref 6–20)
BUN: 8 mg/dL (ref 6–20)
BUN: 6 mg/dL (ref 6–20)
BUN: <5 mg/dL — ABNORMAL LOW (ref 6–20)
CO2: 19 mmol/L — ABNORMAL LOW (ref 22–32)
CO2: 18 mmol/L — ABNORMAL LOW (ref 22–32)
CO2: 19 mmol/L — ABNORMAL LOW (ref 22–32)
CO2: 18 mmol/L — ABNORMAL LOW (ref 22–32)
Calcium: 6.1 mg/dL — CL (ref 8.9–10.3)
Calcium: 6.5 mg/dL — ABNORMAL LOW (ref 8.9–10.3)
Calcium: 6.6 mg/dL — ABNORMAL LOW (ref 8.9–10.3)
Calcium: 6.6 mg/dL — ABNORMAL LOW (ref 8.9–10.3)
Chloride: 102 mmol/L (ref 98–111)
Chloride: 103 mmol/L (ref 98–111)
Chloride: 101 mmol/L (ref 98–111)
Chloride: 104 mmol/L (ref 98–111)
Creatinine, Ser: 0.37 mg/dL — ABNORMAL LOW (ref 0.44–1.00)
Creatinine, Ser: 0.39 mg/dL — ABNORMAL LOW (ref 0.44–1.00)
Creatinine, Ser: 0.36 mg/dL — ABNORMAL LOW (ref 0.44–1.00)
Creatinine, Ser: 0.4 mg/dL — ABNORMAL LOW (ref 0.44–1.00)
GFR, Estimated: >60 mL/min (ref >60)
GFR, Estimated: >60 mL/min (ref >60)
GFR, Estimated: >60 mL/min (ref >60)
GFR, Estimated: >60 mL/min (ref >60)
Glucose, Bld: 139 mg/dL — ABNORMAL HIGH (ref 70–99)
Glucose, Bld: 110 mg/dL — ABNORMAL HIGH (ref 70–99)
Glucose, Bld: 126 mg/dL — ABNORMAL HIGH (ref 70–99)
Glucose, Bld: 111 mg/dL — ABNORMAL HIGH (ref 70–99)
Potassium: 4.2 mmol/L (ref 3.5–5.1)
Potassium: 3.9 mmol/L (ref 3.5–5.1)
Potassium: 3.7 mmol/L (ref 3.5–5.1)
Potassium: 3.7 mmol/L (ref 3.5–5.1)
Sodium: 134 mmol/L — ABNORMAL LOW (ref 135–145)
Sodium: 135 mmol/L (ref 135–145)
Sodium: 133 mmol/L — ABNORMAL LOW (ref 135–145)
Sodium: 135 mmol/L (ref 135–145)

## 2024-03-20 LAB — HEPATIC FUNCTION PANEL
ALT: 145 U/L — ABNORMAL HIGH (ref 0–44)
AST: 127 U/L — ABNORMAL HIGH (ref 15–41)
Albumin: 3.2 g/dL — ABNORMAL LOW (ref 3.5–5.0)
Alkaline Phosphatase: 1440 U/L — ABNORMAL HIGH (ref 38–126)
Bilirubin, Direct: 0.3 mg/dL — ABNORMAL HIGH (ref 0.0–0.2)
Indirect Bilirubin: 0.3 mg/dL (ref 0.3–0.9)
Total Bilirubin: 0.6 mg/dL (ref 0.0–1.2)
Total Protein: 5.8 g/dL — ABNORMAL LOW (ref 6.5–8.1)

## 2024-03-20 LAB — CBC
HCT: 24.8 % — ABNORMAL LOW (ref 36.0–46.0)
Hemoglobin: 7.9 g/dL — ABNORMAL LOW (ref 12.0–15.0)
MCH: 25.7 pg — ABNORMAL LOW (ref 26.0–34.0)
MCHC: 31.9 g/dL (ref 30.0–36.0)
MCV: 80.8 fL (ref 80.0–100.0)
Platelets: 135 K/uL — ABNORMAL LOW (ref 150–400)
RBC: 3.07 MIL/uL — ABNORMAL LOW (ref 3.87–5.11)
RDW: 17.3 % — ABNORMAL HIGH (ref 11.5–15.5)
WBC: 5 K/uL (ref 4.0–10.5)
nRBC: 0 % (ref 0.0–0.2)

## 2024-03-20 LAB — MAGNESIUM: Magnesium: 1.8 mg/dL (ref 1.7–2.4)

## 2024-03-20 LAB — PHOSPHORUS: Phosphorus: 2.1 mg/dL — ABNORMAL LOW (ref 2.5–4.6)

## 2024-03-20 MED ORDER — POTASSIUM PHOSPHATES 15 MMOLE/5ML IV SOLN
30.0000 mmol | Freq: Once | INTRAVENOUS | Status: AC
  Administered 2024-03-20: 30 mmol via INTRAVENOUS
  Filled 2024-03-20: qty 10

## 2024-03-20 MED ORDER — SODIUM CHLORIDE 0.9% FLUSH: 10.0000 mL | INTRAVENOUS | Status: DC | PRN

## 2024-03-20 MED ORDER — PROCHLORPERAZINE EDISYLATE 10 MG/2ML IJ SOLN
10.0000 mg | Freq: Once | INTRAMUSCULAR | Status: AC
  Administered 2024-03-20: 10 mg via INTRAVENOUS
  Filled 2024-03-20: qty 2

## 2024-03-20 MED ORDER — PROCHLORPERAZINE EDISYLATE 10 MG/2ML IJ SOLN
10.0000 mg | Freq: Four times a day (QID) | INTRAMUSCULAR | Status: DC | PRN
  Administered 2024-03-20 – 2024-03-22 (×5): 10 mg via INTRAVENOUS
  Filled 2024-03-20 (×5): qty 2

## 2024-03-20 MED ORDER — MAGNESIUM SULFATE 2 GM/50ML IV SOLN
2.0000 g | Freq: Once | INTRAVENOUS | Status: AC
  Administered 2024-03-20: 2 g via INTRAVENOUS
  Filled 2024-03-20: qty 50

## 2024-03-20 MED ORDER — LACTATED RINGERS IV SOLN: INTRAVENOUS | Status: DC

## 2024-03-20 MED ORDER — PROCHLORPERAZINE EDISYLATE 10 MG/2ML IJ SOLN
10.0000 mg | Freq: Once | INTRAMUSCULAR | Status: AC
Start: 2024-03-20 — End: 2024-03-20
  Administered 2024-03-20: 10 mg via INTRAVENOUS
  Filled 2024-03-20: qty 2

## 2024-03-20 MED ORDER — SODIUM CHLORIDE 0.9% FLUSH
10.0000 mL | Freq: Two times a day (BID) | INTRAVENOUS | Status: DC
  Administered 2024-03-20 – 2024-03-28 (×5): 10 mL

## 2024-03-20 MED ORDER — CHLORHEXIDINE GLUCONATE CLOTH 2 % EX PADS
6.0000 | MEDICATED_PAD | Freq: Every day | CUTANEOUS | Status: DC
  Administered 2024-03-20 – 2024-03-27 (×8): 6 via TOPICAL

## 2024-03-20 NOTE — Plan of Care (Signed)
   Problem: Education: Goal: Knowledge of General Education information will improve Description: Including pain rating scale, medication(s)/side effects and non-pharmacologic comfort measures Outcome: Progressing   Problem: Pain Managment: Goal: General experience of comfort will improve and/or be controlled Outcome: Progressing   Problem: Safety: Goal: Ability to remain free from injury will improve Outcome: Progressing

## 2024-03-20 NOTE — Progress Notes (Signed)
 PROGRESS NOTE    Chelsea Hansen  FMW:[id number] DOB: 08/19/1970 DOA: 03/19/2024 PCP: [name], NP    No chief complaint on file.   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   Assessment & Plan:   Principal Problem:   Nausea vomiting and diarrhea Active Problems:   Hypokalemia   Hypocalcemia   Carcinomatosis peritonei (HCC)   Malignant neoplasm of upper-outer quadrant of right breast in female, estrogen receptor positive (HCC)   QT prolongation   Elevated LFTs   Pancreatic mass   Hypomagnesemia   Neonatal cows' milk hypocalcemia  #1 hypocalcemia/hypomagnesemia/hypokalemia -Likely secondary to nausea and vomiting in the setting of recent chemotherapy treatment. - Patient noted to have received some IV potassium and IV magnesium  at the cancer center just prior to admission. - Potassium on admission noted at 3.3, calcium  of 4.5, magnesium  of 1.0. - Patient on admission received 2 g of IV calcium  gluconate followed by calcium  gluconate infusion. - Potassium repleted. - Transition to oral calcium  and vitamin D  once patient tolerating oral intake. - Magnesium  1.8 this morning will give magnesium  sulfate 2 g IV x 1. - IV fluids, supportive care.  2.  Hypophosphatemia -Likely secondary to GI losses. - K-Phos 30 mmol IV x 1.  3.  Nausea vomiting and diarrhea -Likely secondary to chemotherapy. - Patient with no further diarrhea. - Patient with continued nausea and  emesis but noted to be eager to try to eat. - Currently on clear liquids. - Patient currently on IV tigan  due to QT prolongation. -Check abdominal films. - Place on IV Compazine  as needed. - IV fluids, supportive care.  4.  QTc prolongation -Likely secondary to electrolyte abnormalities. - Repeat EKG with resolution of QTc prolongation. - Keep potassium approximately 4, magnesium  approximately 2. - Supportive care.  5.  Transaminitis: Pancreatic head lesion with duodenal obstruction status post G-tube/biliary obstruction due to malignancy -Patient with elevated alk phosphatase and transaminitis.  Bilirubin normal. - Right upper quadrant ultrasound with common bile duct dilatation at 13 mm which compares to reported 12 mm diameter on previous MRI from 03/07/2024.  Subtle heterogenicity of hepatic echotexture.   - Patient noted to have been started on chemotherapy with Enhertu  to reduce tumor burden, alleviate abdominal symptoms and improve bowel flow. - Per oncology.  6.  Metastatic breast cancer/chronic cancer-related pain -Patient noted to be receiving Enhertu  under the care of oncology, Dr.[name]. - Continue current pain regimen. - Oncology informed of admission via epic.  7.  Anemia secondary to malignancy and/oral chemotherapy -Hemoglobin currently at 7.9 this morning. -Patient with no overt bleeding. - Follow H&H. - Transfusion threshold hemoglobin < 7.   DVT prophylaxis: Lovenox  Code Status: Full Family Communication: Updated patient and parents at bedside. Disposition: Likely home when clinically improved, tolerating oral intake with resolution of electrolyte abnormalities.  Status is: Observation The patient will require care spanning > 2 midnights and should be moved to inpatient because: Severity of illness,   Consultants:  Oncology informed of admission via epic: Dr. [name].  Procedures:  None  Antimicrobials:  Anti-infectives (From admission, onward)  None          Subjective: Patient sitting up in bed.  Noted to have some emesis this morning.  Currently tolerating a ginger ale.  Denies any chest pain.  No significant change in chronic abdominal pain.  No diarrhea today.  Parents at bedside.  Objective: Vitals:   03/19/24 2143 03/20/24 0129 03/20/24 0620 03/20/24 1330  BP: 119/69 110/66 124/81 118/64  Pulse: (!) 54 62 62 (!) 53  Resp: 18 18 19 18   Temp: 98.4 F (36.9 C) 98.1 F (36.7 C) 98 F (36.7 C) 97.7 F (36.5 C)  TempSrc: Oral Oral Oral Oral  SpO2: 97% 100% 94% 98%  Height:        Intake/Output Summary (Last 24 hours) at 03/20/2024 1838 Last data filed at 03/20/2024 1412 Gross per 24 hour  Intake 1616.88 ml  Output --  Net 1616.88 ml   There were no vitals filed for this visit.  Examination:  General exam: Appears calm and comfortable  Respiratory system: Clear to auscultation.  No wheezes, no crackles, no rhonchi.  Fair air movement.  Speaking in full sentences.  Respiratory effort normal. Cardiovascular system: S1 & S2 heard, RRR. No JVD, murmurs, rubs, gallops or clicks. No pedal edema. Gastrointestinal system: Abdomen is nondistended, soft and some diffuse tenderness to palpation.  Positive bowel sounds.  G-tube in place.  J-tube in place.  Central nervous system: Alert and oriented. No focal neurological deficits. Extremities: Symmetric 5 x 5 power. Skin: No rashes, lesions or ulcers Psychiatry: Judgement and insight appear normal. Mood & affect appropriate.     Data Reviewed: I have personally reviewed following labs and imaging studies  CBC: Recent Labs  Lab 03/14/24 1406 03/19/24 1232 03/20/24 0255  WBC 4.8 4.1 5.0  NEUTROABS 2.7 2.8  --   HGB 8.9* 9.0* 7.9*  HCT 26.8* 26.1* 24.8*  MCV 79.1* 77.7* 80.8  PLT 132* 143* 135*    Basic Metabolic Panel: Recent Labs  Lab 03/19/24 1232 03/19/24 2118 03/20/24 0255 03/20/24 0758 03/20/24 0803 03/20/24 1434  NA 137 135 134* 135  --  133*  K 3.3*  3.8 4.2 3.9  --  3.7  CL 103 103 102 103  --  101  CO2 23 18* 19* 18*  --  19*  GLUCOSE 89 141* 139* 110*  --  126*  BUN 8 7 7 8   --  6  CREATININE 0.41* 0.44 0.37* 0.39*  --  0.36*  CALCIUM  4.5* 6.1* 6.1* 6.5*  --  6.6*  MG 1.0*  --  1.8  --   --   --   PHOS  --   --   --   --  2.1*  --     GFR: Estimated Creatinine Clearance: 93 mL/min (A) (by C-G formula based on SCr of 0.36 mg/dL (L)).  Liver Function Tests: Recent Labs  Lab 03/14/24 1406 03/19/24 1232 03/20/24 0255  AST 29 126* 127*  ALT 23 130* 145*  ALKPHOS 1,492* 1,529* 1,440*  BILITOT 0.5 0.6 0.6  PROT 6.0* 6.4* 5.8*  ALBUMIN  3.1* 3.4* 3.2*    CBG: No results for input(s): GLUCAP in the last 168 hours.   No results found for this or any previous visit (from the past 240 hours).       Radiology Studies: US  Abdomen Limited RUQ (LIVER/GB) Result Date: 03/20/2024 CLINICAL DATA:  Elevated LFTs. EXAM: ULTRASOUND ABDOMEN LIMITED RIGHT UPPER QUADRANT COMPARISON:  MRI abdomen 03/07/2024 FINDINGS: Gallbladder: No gallstones or wall  thickening visualized. No sonographic Murphy sign noted by sonographer. Common bile duct: Diameter: 13 mm. This compares to a reported 12 mm diameter on the previous MRI. Liver: Subtle heterogeneity of hepatic echotexture. Portal vein is patent on color Doppler imaging with normal direction of blood flow towards the liver. Other: None. IMPRESSION: 1. Common bile duct dilatation at 13 mm. This compares to a reported 12 mm diameter on the previous MRI from 03/07/2024. 2. Subtle heterogeneity of hepatic echotexture. Electronically Signed   By: Camellia Candle M.D.   On: 03/20/2024 06:22        Scheduled Meds:  Chlorhexidine  Gluconate Cloth  6 each Topical Daily   enoxaparin  (LOVENOX ) injection  40 mg Subcutaneous Q24H   fentaNYL   1 patch Transdermal Q72H   sodium chloride  flush  10-40 mL Intracatheter Q12H   sodium chloride  flush  3 mL Intravenous Q12H   Continuous Infusions:  calcium   gluconate 930 mg of elemental calcium  in sodium chloride  0.9 % 1,000 mL infusion 1 mg/kg/hr of elemental calcium  (03/20/24 1412)   lactated ringers  125 mL/hr at 03/20/24 1412     LOS: 0 days    Time spent: 45 minutes    Toribio Hummer, MD [facility]   To contact the attending provider between 7A-7P or the covering provider during after hours 7P-7A, please log into the web site [email or web address] and access using universal [place] password for that web site. If you do not have the password, please call the hospital operator.  03/20/2024, 6:38 PM

## 2024-03-20 NOTE — TOC Initial Note (Signed)
 Transition of Care [facility]) - Initial/Assessment Note    Patient Details  Name: Chelsea Hansen MRN: [id number] Date of Birth: 1971/04/13  [facility] CM/SW Contact:    Bascom Service, RN Phone Number: 03/20/2024, 3:16 PM  Clinical Narrative: d/c plan home. Has own transport home.                  Expected Discharge Plan: Home/Self Care Barriers to Discharge: Continued Medical Work up   Patient Goals and CMS Choice Patient states their goals for this hospitalization and ongoing recovery are:: Home CMS Medicare.gov Compare Post Acute Care list provided to:: Patient Choice offered to / list presented to : Patient [place] ownership interest in [facility].provided to:: Patient    Expected Discharge Plan and Services   Discharge Planning Services: CM Consult   Living arrangements for the past 2 months: Single Family Home                                      Prior Living Arrangements/Services Living arrangements for the past 2 months: Single Family Home Lives with:: Adult Children              Current home services: DME (rollator,cane)    Activities of Daily Living   ADL Screening (condition at time of admission) Independently performs ADLs?: Yes (appropriate for developmental age) Is the patient deaf or have difficulty hearing?: No Does the patient have difficulty seeing, even when wearing glasses/contacts?: No Does the patient have difficulty concentrating, remembering, or making decisions?: No  Permission Sought/Granted                  Emotional Assessment              Admission diagnosis:  Electrolyte imbalance [E87.8] Hypocalcemia [E83.51] Neonatal cows' milk hypocalcemia [P71.0] Patient Active Problem List   Diagnosis Date Noted   Neonatal cows' milk hypocalcemia 03/20/2024   Nausea vomiting and diarrhea 03/19/2024   H/O bypass gastrojejunostomy 03/08/2024   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  03/07/2024   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   Common bile duct dilation 03/07/2024   Biliary obstruction 03/07/2024   Abnormal LFTs 03/07/2024   Anemia 03/07/2024   Gastrostomy tube present (HCC) 02/10/2024   Irritant contact dermatitis associated with digestive stoma 02/10/2024   Protein-calorie malnutrition, severe 02/03/2024   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 02/01/2024   Generalized abdominal pain 01/17/2024   Elevated CA 19-9 level 01/16/2024   Hypophosphatemia 01/04/2024   Hypomagnesemia 01/04/2024   Pancreatic lesion 12/29/2023   Unintentional weight loss 12/29/2023   Nausea and vomiting 12/29/2023   Duodenal obstruction 12/29/2023   Abnormal MRI of abdomen 12/26/2023   Abnormal CT of the abdomen 12/23/2023   Elevated liver enzymes 12/23/2023   Acute kidney injury 12/23/2023   AKI (acute kidney injury) 12/22/2023   Elevated LFTs 12/22/2023   Pancreatic mass 12/22/2023   Right sided weakness 12/22/2023   Hypotension 12/22/2023   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   Abnormal CT scan, colon 12/14/2023   Acute esophagitis 12/14/2023   Candida esophagitis (HCC) 12/14/2023   Therapeutic opioid-induced constipation (OIC) 12/11/2023   Hypocalcemia 12/10/2023   Fever of unknown origin 12/10/2023   Sinus tachycardia 12/10/2023   Acute kidney injury superimposed on chronic kidney disease 12/05/2023   Electrolyte abnormality 12/05/2023   Dehydration 12/05/2023   Prolonged QT interval  12/05/2023   Intractable nausea and vomiting 11/24/2023   Neoplasm related pain 12/07/2022   Constipation 06/30/2022   Nausea and vomiting in adult 06/29/2022   Hematemesis 06/28/2022   Breast cancer metastasized to bone, right (HCC) 05/20/2022   Supraventricular tachycardia 07/08/2021   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   GERD (gastroesophageal reflux disease) 05/27/2021   HLD (hyperlipidemia) 05/27/2021   Essential hypertension 05/27/2021    Pain management contract signed 05/04/2021   Chronic pain syndrome (breast cancer) 05/04/2021   Palpitations 03/25/2021   Sinus bradycardia 03/25/2021   Daytime somnolence 03/25/2021   Snoring 03/25/2021   Obesity (BMI 30-39.9) 03/25/2021   Port-A-Cath in place 01/27/2021   Genetic testing 12/24/2020   Family history of uterine cancer 12/17/2020   Malignant neoplasm of upper-outer quadrant of right breast in female, estrogen receptor positive (HCC) 12/12/2020   Breast cancer (HCC) 12/12/2020   Hypokalemia 01/02/2012   Chronic asthma 01/01/2012   Bradycardia 01/01/2012   Syncope 12/31/2011   Mediastinal adenopathy 12/31/2011   Pancytopenia (HCC) 12/31/2011   Chest pain 12/31/2011   PCP:  [name], NP Pharmacy:   [facility] [address] [place] [address] Phone: [phone] Fax: [phone]  [name] [address] [place] [address] Phone: [phone] Fax: [phone]     Social Drivers of Health (SDOH) Social History: SDOH Screenings   Food Insecurity: No Food Insecurity (03/19/2024)  Recent Concern: Food Insecurity - Food Insecurity Present (02/16/2024)  Housing: Low Risk  (03/19/2024)  Transportation Needs: No Transportation Needs (03/19/2024)  Utilities: Not At Risk (03/19/2024)  Depression (PHQ2-9): Low Risk  (02/25/2024)  Recent Concern: Depression (PHQ2-9) - Medium Risk (01/25/2024)  Financial Resource Strain: High Risk (02/13/2024)  Physical Activity: Inactive (02/13/2024)  Social Connections: Moderately Isolated (02/13/2024)  Stress: No Stress Concern Present (02/13/2024)  Tobacco Use: Low Risk  (03/19/2024)  Recent Concern: Tobacco Use - High Risk (01/27/2024)   Received from [facility]  Health Literacy: Adequate Health Literacy (02/13/2024)   SDOH Interventions:     Readmission Risk Interventions    03/05/2024    4:03 PM 12/07/2023   12:21 PM 11/26/2023     2:58 PM  Readmission Risk Prevention Plan  Transportation Screening Complete Complete Complete  PCP or Specialist Appt within 3-5 Days   Complete  HRI or Home Care Consult   Complete  Social Work Consult for Recovery Care Planning/Counseling   Complete  Palliative Care Screening   Not Applicable  Medication Review Oceanographer) Complete Complete Complete  PCP or Specialist appointment within 3-5 days of discharge Complete Complete   HRI or Home Care Consult Complete Complete   SW Recovery Care/Counseling Consult Complete Complete   Palliative Care Screening Not Applicable Not Applicable   Skilled Nursing Facility Not Applicable Not Applicable

## 2024-03-20 NOTE — Telephone Encounter (Signed)
 Received call from pt stating she has been admitted to [name] and would like for MD to stop by to discuss some things.  RN alerted MD.

## 2024-03-20 NOTE — Progress Notes (Signed)
 [facility] Spiritual Care Note  Followed up with Shona briefly by phone per her request, but she was feeling so unwell due to nausea and vomiting that we plan to follow up next week instead.  If [facility] chaplain assistance is needed during her admission, please page [phone]. Thank you!  [address] [name], [place], [facility] Pager [phone] Voicemail [phone]

## 2024-03-21 ENCOUNTER — Inpatient Hospital Stay: Payer: MEDICAID

## 2024-03-21 ENCOUNTER — Inpatient Hospital Stay: Payer: MEDICAID | Admitting: Adult Health

## 2024-03-21 ENCOUNTER — Other Ambulatory Visit: Payer: Self-pay | Admitting: Adult Health

## 2024-03-21 DIAGNOSIS — R197 Diarrhea, unspecified: Secondary | ICD-10-CM | POA: Diagnosis not present

## 2024-03-21 DIAGNOSIS — R112 Nausea with vomiting, unspecified: Secondary | ICD-10-CM | POA: Diagnosis not present

## 2024-03-21 LAB — COMPREHENSIVE METABOLIC PANEL WITH GFR
ALT: 143 U/L — ABNORMAL HIGH (ref 0–44)
AST: 103 U/L — ABNORMAL HIGH (ref 15–41)
Albumin: 3.1 g/dL — ABNORMAL LOW (ref 3.5–5.0)
Alkaline Phosphatase: 1355 U/L — ABNORMAL HIGH (ref 38–126)
Anion gap: 12 (ref 5–15)
BUN: <5 mg/dL — ABNORMAL LOW (ref 6–20)
CO2: 19 mmol/L — ABNORMAL LOW (ref 22–32)
Calcium: 6.9 mg/dL — ABNORMAL LOW (ref 8.9–10.3)
Chloride: 107 mmol/L (ref 98–111)
Creatinine, Ser: 0.33 mg/dL — ABNORMAL LOW (ref 0.44–1.00)
GFR, Estimated: >60 mL/min (ref >60)
Glucose, Bld: 94 mg/dL (ref 70–99)
Potassium: 3.4 mmol/L — ABNORMAL LOW (ref 3.5–5.1)
Sodium: 138 mmol/L (ref 135–145)
Total Bilirubin: 0.4 mg/dL (ref 0.0–1.2)
Total Protein: 5.4 g/dL — ABNORMAL LOW (ref 6.5–8.1)

## 2024-03-21 LAB — PHOSPHORUS: Phosphorus: 2.2 mg/dL — ABNORMAL LOW (ref 2.5–4.6)

## 2024-03-21 LAB — CBC WITH DIFFERENTIAL/PLATELET
Abs Immature Granulocytes: 0.2 K/uL — ABNORMAL HIGH (ref 0.00–0.07)
Basophils Absolute: 0.1 K/uL (ref 0.0–0.1)
Basophils Relative: 2 %
Eosinophils Absolute: 0 K/uL (ref 0.0–0.5)
Eosinophils Relative: 0 %
HCT: 22.6 % — ABNORMAL LOW (ref 36.0–46.0)
Hemoglobin: 7.2 g/dL — ABNORMAL LOW (ref 12.0–15.0)
Lymphocytes Relative: 14 %
Lymphs Abs: 0.6 K/uL — ABNORMAL LOW (ref 0.7–4.0)
MCH: 25.8 pg — ABNORMAL LOW (ref 26.0–34.0)
MCHC: 31.9 g/dL (ref 30.0–36.0)
MCV: 81 fL (ref 80.0–100.0)
Metamyelocytes Relative: 4 %
Monocytes Absolute: 0 K/uL — ABNORMAL LOW (ref 0.1–1.0)
Monocytes Relative: 1 %
Neutro Abs: 3.3 K/uL (ref 1.7–7.7)
Neutrophils Relative %: 79 %
Platelets: 129 K/uL — ABNORMAL LOW (ref 150–400)
RBC: 2.79 MIL/uL — ABNORMAL LOW (ref 3.87–5.11)
RDW: 17.5 % — ABNORMAL HIGH (ref 11.5–15.5)
WBC: 4.2 K/uL (ref 4.0–10.5)
nRBC: 0 % (ref 0.0–0.2)

## 2024-03-21 LAB — MAGNESIUM: Magnesium: 1.8 mg/dL (ref 1.7–2.4)

## 2024-03-21 MED ORDER — MAGNESIUM SULFATE 2 GM/50ML IV SOLN
2.0000 g | Freq: Once | INTRAVENOUS | Status: AC
Start: 2024-03-21 — End: 2024-03-22
  Administered 2024-03-21: 2 g via INTRAVENOUS
  Filled 2024-03-21: qty 50

## 2024-03-21 MED ORDER — CALCIUM CARBONATE 1250 (500 CA) MG PO TABS
1000.0000 mg | ORAL_TABLET | Freq: Two times a day (BID) | ORAL | Status: DC
Start: 2024-03-21 — End: 2024-03-26
  Filled 2024-03-21 (×6): qty 2

## 2024-03-21 MED ORDER — POTASSIUM CHLORIDE 10 MEQ/100ML IV SOLN
10.0000 meq | INTRAVENOUS | Status: AC
  Administered 2024-03-21 (×2): 10 meq via INTRAVENOUS
  Filled 2024-03-21 (×2): qty 100

## 2024-03-21 MED ORDER — LACTATED RINGERS IV SOLN: INTRAVENOUS | Status: DC

## 2024-03-21 MED ORDER — POTASSIUM & SODIUM PHOSPHATES 280-160-250 MG PO PACK
2.0000 | PACK | ORAL | Status: AC
  Administered 2024-03-21 (×2): 2 via ORAL
  Filled 2024-03-21 (×3): qty 2

## 2024-03-21 MED ORDER — NYSTATIN 100000 UNIT/ML MT SUSP
5.0000 mL | Freq: Four times a day (QID) | OROMUCOSAL | Status: DC
  Administered 2024-03-21: 500000 [IU] via ORAL
  Filled 2024-03-21 (×12): qty 5

## 2024-03-21 MED ORDER — POTASSIUM CHLORIDE CRYS ER 20 MEQ PO TBCR
40.0000 meq | EXTENDED_RELEASE_TABLET | Freq: Once | ORAL | Status: DC
  Filled 2024-03-21: qty 2

## 2024-03-21 NOTE — Progress Notes (Signed)
 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     Assessment & Plan:   Principal Problem:   Nausea vomiting and diarrhea Active Problems:   Hypokalemia   Hypocalcemia   Carcinomatosis peritonei (HCC)   Malignant neoplasm of upper-outer quadrant of right breast in female, estrogen receptor positive (HCC)   QT prolongation   Elevated LFTs   Pancreatic mass   Hypomagnesemia   Neonatal cows' milk hypocalcemia  1-Hypocalcemia/hypomagnesemia/hypokalemia - In the setting of nausea vomiting and diarrhea in the setting of recent chemotherapy treatment. - Has received IV supplementation. Transition from IV calcium  gluconate infusion to oral calcium  and monitor Replete potassium and magnesium  IV  2-Hypophosphatemia: -Replete oral phosphate  3-Nausea vomiting and diarrhea: - No further diarrhea since yesterday morning. - GI pathogen and C. difficile ordered - Denies fever or vomiting.  Will advance diet to soft today. - Likely in the setting of chemotherapy  4-Prolonged QT: - In the setting of multiple electrolytes abnormalities. Prolonged QT improved  Transaminases, pancreatic head lesion with duodenal obstruction and status post G-tube/biliary obstruction due to malignancy - Right upper quadrant ultrasound with common bile duct dilatation at 13 mm which compares to reported 12 mm diameter on previous MRI from 03/07/2024.  Subtle heterogenicity of hepatic  echotexture.   - Patient noted to have been started on chemotherapy with Enhertu  to reduce tumor burden, alleviate abdominal symptoms and improve bowel flow. - Liver function test trending down  Metastatic breast cancer/chronic cancer-related pain - Under care of Dr. Valere, receiving Enhertu   Anemia secondary to malignancy and  oral chemotherapy: - Continue to monitor hemoglobin  See wound care documentation below  Wound 02/01/24 1630 Pressure Injury Buttocks Left Stage 2 -  Partial thickness loss of dermis presenting as a shallow open injury with a red, pink wound bed without slough. (Active)     Wound 02/01/24 1630 Pressure Injury Buttocks Right Stage 2 -  Partial thickness loss of dermis presenting as a shallow open injury with a red, pink wound bed without slough. (Active)  Wound care  Estimated body mass index is 21.91 kg/m as calculated from the following:   Height as of this encounter: 5' 11 (1.803 m).   Weight as of this encounter: 71.3 kg.   DVT prophylaxis: Lovenox  Code Status: Full code Family Communication: Care discussed with patient Disposition Plan:  Status is: Inpatient Remains inpatient appropriate because: Management of multiple electrolytes abnormalities    Consultants:  Dr. Godina added to treatment team  Procedures:    Antimicrobials:    Subjective: She report no fever diarrhea since yesterday morning, reports small amount of vomiting last night.  She is willing to advance diet to soft  Objective: Vitals:   03/20/24 1330 03/20/24 2146 03/21/24 0428 03/21/24 0605  BP: 118/64 112/68  125/76  Pulse: (!) 53 63  61  Resp: 18 20    Temp: 97.7 F (36.5 C) 97.9 F (36.6 C)  97.9 F (36.6 C)  TempSrc: Oral Oral  Oral  SpO2: 98% 100%  100%  Weight:   71.3 kg  Height:   5' 11 (1.803 m)     Intake/Output Summary (Last 24 hours) at 03/21/2024 0722 Last data filed at 03/21/2024 0400 Gross per 24 hour  Intake 3866 ml  Output --  Net 3866 ml    Filed Weights   03/21/24 0428  Weight: 71.3 kg    Examination:  General exam: Appears calm and comfortable  Respiratory system: Clear to auscultation. Respiratory effort normal. Cardiovascular system: S1 & S2 heard, RRR. No JVD, murmurs, rubs, gallops or clicks. No pedal edema. Gastrointestinal system: Abdomen is nondistended, soft and nontender. No organomegaly or masses felt. Normal bowel sounds heard. Central nervous system: Alert and oriented. No focal neurological deficits. Extremities: Symmetric 5 x 5 power.    Data Reviewed: I have personally reviewed following labs and imaging studies  CBC: Recent Labs  Lab 03/14/24 1406 03/19/24 1232 03/20/24 0255 03/21/24 0154  WBC 4.8 4.1 5.0 4.2  NEUTROABS 2.7 2.8  --  3.3  HGB 8.9* 9.0* 7.9* 7.2*  HCT 26.8* 26.1* 24.8* 22.6*  MCV 79.1* 77.7* 80.8 81.0  PLT 132* 143* 135* 129*   Basic Metabolic Panel: Recent Labs  Lab 03/19/24 1232 03/19/24 2118 03/20/24 0255 03/20/24 0758 03/20/24 0803 03/20/24 1434 03/20/24 1951 03/21/24 0154  NA 137   < > 134* 135  --  133* 135 138  K 3.3*   < > 4.2 3.9  --  3.7 3.7 3.4*  CL 103   < > 102 103  --  101 104 107  CO2 23   < > 19* 18*  --  19* 18* 19*  GLUCOSE 89   < > 139* 110*  --  126* 111* 94  BUN 8   < > 7 8  --  6 <5* <5*  CREATININE 0.41*   < > 0.37* 0.39*  --  0.36* 0.40* 0.33*  CALCIUM  4.5*   < > 6.1* 6.5*  --  6.6* 6.6* 6.9*  MG 1.0*  --  1.8  --   --   --   --  1.8  PHOS  --   --   --   --  2.1*  --   --  2.2*   < > = values in this interval not displayed.   GFR: Estimated Creatinine Clearance: 93 mL/min (A) (by C-G formula based on SCr of 0.33 mg/dL (L)). Liver Function Tests: Recent Labs  Lab 03/14/24 1406 03/19/24 1232 03/20/24 0255 03/21/24 0154  AST 29 126* 127* 103*  ALT 23 130* 145* 143*  ALKPHOS 1,492* 1,529* 1,440* 1,355*  BILITOT 0.5 0.6 0.6 0.4  PROT 6.0* 6.4* 5.8* 5.4*  ALBUMIN  3.1* 3.4* 3.2* 3.1*   No results for input(s): LIPASE,  AMYLASE in the last 168 hours. No results for input(s): AMMONIA in the last 168 hours. Coagulation Profile: No results for input(s): INR, PROTIME in the last 168 hours. Cardiac Enzymes: No results for input(s): CKTOTAL, CKMB, CKMBINDEX, TROPONINI in the last 168 hours. BNP (last 3 results) No results for input(s): PROBNP in the last 8760 hours. HbA1C: No results for input(s): HGBA1C in the last 72 hours. CBG: No results for input(s): GLUCAP in the last 168 hours. Lipid Profile: No results for input(s): CHOL, HDL, LDLCALC, TRIG, CHOLHDL, LDLDIRECT in the last 72 hours. Thyroid  Function Tests: No results for input(s): TSH, T4TOTAL, FREET4, T3FREE, THYROIDAB in the last 72 hours. Anemia Panel: No results for input(s): VITAMINB12, FOLATE, FERRITIN, TIBC, IRON, RETICCTPCT in the last 72 hours. Sepsis Labs: No results  for input(s): PROCALCITON, LATICACIDVEN in the last 168 hours.  No results found for this or any previous visit (from the past 240 hours).       Radiology Studies: DG Abd 2 Views Result Date: 03/20/2024 CLINICAL DATA:  Nausea vomiting EXAM: ABDOMEN - 2 VIEW COMPARISON:  Ultrasound abdomen March 20, 2024 FINDINGS: Paucity of bowel gas. No bowel obstruction identified. Gastrostomy tube in place. There is no evidence of free air. No radio-opaque calculi or other significant radiographic abnormality is seen. Heterogeneous sclerotic changes throughout the axial and appendicular skeleton consistent with sclerotic metastasis (patient has history of breast cancer). IMPRESSION: No bowel obstruction. Sclerotic osseous metastasis. Electronically Signed   By: Megan  Zare M.D.   On: 03/20/2024 19:55   US  Abdomen Limited RUQ (LIVER/GB) Result Date: 03/20/2024 CLINICAL DATA:  Elevated LFTs. EXAM: ULTRASOUND ABDOMEN LIMITED RIGHT UPPER QUADRANT COMPARISON:  MRI abdomen 03/07/2024 FINDINGS: Gallbladder: No gallstones or wall  thickening visualized. No sonographic Murphy sign noted by sonographer. Common bile duct: Diameter: 13 mm. This compares to a reported 12 mm diameter on the previous MRI. Liver: Subtle heterogeneity of hepatic echotexture. Portal vein is patent on color Doppler imaging with normal direction of blood flow towards the liver. Other: None. IMPRESSION: 1. Common bile duct dilatation at 13 mm. This compares to a reported 12 mm diameter on the previous MRI from 03/07/2024. 2. Subtle heterogeneity of hepatic echotexture. Electronically Signed   By: Camellia Candle M.D.   On: 03/20/2024 06:22        Scheduled Meds:  Chlorhexidine  Gluconate Cloth  6 each Topical Daily   enoxaparin  (LOVENOX ) injection  40 mg Subcutaneous Q24H   fentaNYL   1 patch Transdermal Q72H   sodium chloride  flush  10-40 mL Intracatheter Q12H   sodium chloride  flush  3 mL Intravenous Q12H   Continuous Infusions:  calcium  gluconate 930 mg of elemental calcium  in sodium chloride  0.9 % 1,000 mL infusion 1 mg/kg/hr of elemental calcium  (03/20/24 1843)   lactated ringers  125 mL/hr at 03/21/24 0524     LOS: 1 day    Time spent: 35 minutes.     Owen DELENA Lore, MD [facility]   If 7PM-7AM, please contact night-coverage [email or web address]  03/21/2024, 7:22 AM

## 2024-03-21 NOTE — Progress Notes (Signed)
 S; reports that the nausea has improved as of this morning.  She was able to eat a decent lunch.  Diarrhea stopped last night.     03/21/2024    1:09 PM 03/21/2024    9:28 AM 03/21/2024    6:05 AM  Vitals with BMI  Systolic 113 121 874  Diastolic 72 71 76  Pulse 62 66 61   Examination: Abdomen: Mild to moderate distention Lungs clear Heart S1-S2 normal Neurological exam: No focal deficits     Latest Ref Rng & Units 03/21/2024    1:54 AM 03/20/2024    2:55 AM 03/19/2024   12:32 PM  CBC  WBC 4.0 - 10.5 K/uL 4.2  5.0  4.1   Hemoglobin 12.0 - 15.0 g/dL 7.2  7.9  9.0   Hematocrit 36.0 - 46.0 % 22.6  24.8  26.1   Platelets 150 - 400 K/uL 129  135  143       Latest Ref Rng & Units 03/21/2024    1:54 AM 03/20/2024    7:51 PM 03/20/2024    2:34 PM  CMP  Glucose 70 - 99 mg/dL 94  888  873   BUN 6 - 20 mg/dL <5  <5  6   Creatinine 0.44 - 1.00 mg/dL 9.66  9.59  9.63   Sodium 135 - 145 mmol/L 138  135  133   Potassium 3.5 - 5.1 mmol/L 3.4  3.7  3.7   Chloride 98 - 111 mmol/L 107  104  101   CO2 22 - 32 mmol/L 19  18  19    Calcium  8.9 - 10.3 mg/dL 6.9  6.6  6.6   Total Protein 6.5 - 8.1 g/dL 5.4     Total Bilirubin 0.0 - 1.2 mg/dL 0.4     Alkaline Phos 38 - 126 U/L 1,355     AST 15 - 41 U/L 103     ALT 0 - 44 U/L 143      Assessment and plan: 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  In spite of that after each treatment she appears to get nausea and vomiting and electrolyte imbalances. Plan: For future treatments we will bring her into the outpatient setting every 2 to 3 days to replenish her with electrolytes and fluids.  This way we can try and prevent her recurrent hospitalizations. Anemia due to chemotherapy: Could use a unit of PRBC. Electrolyte imbalances: Improved significantly.  If she continues to do well she could be discharged home tomorrow.

## 2024-03-21 NOTE — Plan of Care (Signed)

## 2024-03-22 DIAGNOSIS — R197 Diarrhea, unspecified: Secondary | ICD-10-CM | POA: Diagnosis not present

## 2024-03-22 DIAGNOSIS — R112 Nausea with vomiting, unspecified: Secondary | ICD-10-CM | POA: Diagnosis not present

## 2024-03-22 LAB — CBC WITH DIFFERENTIAL/PLATELET
Abs Immature Granulocytes: 0.35 K/uL — ABNORMAL HIGH (ref 0.00–0.07)
Basophils Absolute: 0 K/uL (ref 0.0–0.1)
Basophils Relative: 1 %
Eosinophils Absolute: 0.1 K/uL (ref 0.0–0.5)
Eosinophils Relative: 2 %
HCT: 22.4 % — ABNORMAL LOW (ref 36.0–46.0)
Hemoglobin: 7 g/dL — ABNORMAL LOW (ref 12.0–15.0)
Immature Granulocytes: 9 %
Lymphocytes Relative: 31 %
Lymphs Abs: 1.2 K/uL (ref 0.7–4.0)
MCH: 25.3 pg — ABNORMAL LOW (ref 26.0–34.0)
MCHC: 31.3 g/dL (ref 30.0–36.0)
MCV: 80.9 fL (ref 80.0–100.0)
Monocytes Absolute: 0.3 K/uL (ref 0.1–1.0)
Monocytes Relative: 9 %
Neutro Abs: 1.9 K/uL (ref 1.7–7.7)
Neutrophils Relative %: 48 %
Platelets: 126 K/uL — ABNORMAL LOW (ref 150–400)
RBC: 2.77 MIL/uL — ABNORMAL LOW (ref 3.87–5.11)
RDW: 17.9 % — ABNORMAL HIGH (ref 11.5–15.5)
Smear Review: NORMAL
WBC: 3.8 K/uL — ABNORMAL LOW (ref 4.0–10.5)
nRBC: 0.8 % — ABNORMAL HIGH (ref 0.0–0.2)

## 2024-03-22 LAB — BASIC METABOLIC PANEL WITH GFR
Anion gap: 11 (ref 5–15)
Anion gap: 10 (ref 5–15)
BUN: <5 mg/dL — ABNORMAL LOW (ref 6–20)
BUN: <5 mg/dL — ABNORMAL LOW (ref 6–20)
CO2: 21 mmol/L — ABNORMAL LOW (ref 22–32)
CO2: 22 mmol/L (ref 22–32)
Calcium: 5.8 mg/dL — CL (ref 8.9–10.3)
Calcium: 5.2 mg/dL — CL (ref 8.9–10.3)
Chloride: 107 mmol/L (ref 98–111)
Chloride: 105 mmol/L (ref 98–111)
Creatinine, Ser: 0.31 mg/dL — ABNORMAL LOW (ref 0.44–1.00)
Creatinine, Ser: 0.3 mg/dL — ABNORMAL LOW (ref 0.44–1.00)
GFR, Estimated: >60 mL/min (ref >60)
GFR, Estimated: >60 mL/min (ref >60)
Glucose, Bld: 82 mg/dL (ref 70–99)
Glucose, Bld: 104 mg/dL — ABNORMAL HIGH (ref 70–99)
Potassium: 3.5 mmol/L (ref 3.5–5.1)
Potassium: 3.7 mmol/L (ref 3.5–5.1)
Sodium: 139 mmol/L (ref 135–145)
Sodium: 137 mmol/L (ref 135–145)

## 2024-03-22 LAB — HEMOGLOBIN AND HEMATOCRIT, BLOOD
HCT: 26.6 % — ABNORMAL LOW (ref 36.0–46.0)
Hemoglobin: 8.7 g/dL — ABNORMAL LOW (ref 12.0–15.0)

## 2024-03-22 LAB — MAGNESIUM: Magnesium: 1.6 mg/dL — ABNORMAL LOW (ref 1.7–2.4)

## 2024-03-22 LAB — PHOSPHORUS: Phosphorus: 2.3 mg/dL — ABNORMAL LOW (ref 2.5–4.6)

## 2024-03-22 LAB — PREPARE RBC (CROSSMATCH)

## 2024-03-22 MED ORDER — PROCHLORPERAZINE EDISYLATE 10 MG/2ML IJ SOLN
10.0000 mg | Freq: Three times a day (TID) | INTRAMUSCULAR | Status: DC
  Administered 2024-03-22 – 2024-03-28 (×19): 10 mg via INTRAVENOUS
  Filled 2024-03-22 (×19): qty 2

## 2024-03-22 MED ORDER — POTASSIUM PHOSPHATES 15 MMOLE/5ML IV SOLN
15.0000 mmol | Freq: Once | INTRAVENOUS | Status: AC
  Administered 2024-03-22: 15 mmol via INTRAVENOUS
  Filled 2024-03-22: qty 5

## 2024-03-22 MED ORDER — SODIUM CHLORIDE 0.9% IV SOLUTION: Freq: Once | INTRAVENOUS | Status: DC

## 2024-03-22 MED ORDER — CALCIUM GLUCONATE-NACL 1-0.675 GM/50ML-% IV SOLN
1.0000 g | Freq: Once | INTRAVENOUS | Status: AC
  Administered 2024-03-22: 1000 mg via INTRAVENOUS
  Filled 2024-03-22: qty 50

## 2024-03-22 MED ORDER — MAGNESIUM OXIDE -MG SUPPLEMENT 400 (240 MG) MG PO TABS
200.0000 mg | ORAL_TABLET | Freq: Two times a day (BID) | ORAL | Status: DC
Start: 2024-03-22 — End: 2024-03-28
  Administered 2024-03-22: 200 mg via ORAL
  Filled 2024-03-22 (×9): qty 1

## 2024-03-22 MED ORDER — MAGNESIUM SULFATE 2 GM/50ML IV SOLN
2.0000 g | Freq: Once | INTRAVENOUS | Status: AC
  Administered 2024-03-22: 2 g via INTRAVENOUS
  Filled 2024-03-22: qty 50

## 2024-03-22 NOTE — Progress Notes (Signed)
 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     Assessment & Plan:   Principal Problem:   Nausea vomiting and diarrhea Active Problems:   Hypokalemia   Hypocalcemia   Carcinomatosis peritonei (HCC)   Malignant neoplasm of upper-outer quadrant of right breast in female, estrogen receptor positive (HCC)   QT prolongation   Elevated LFTs   Pancreatic mass   Hypomagnesemia   Neonatal cows' milk hypocalcemia  1-Hypocalcemia/hypomagnesemia/hypokalemia - In the setting of nausea vomiting and diarrhea in the setting of recent chemotherapy treatment. - Has received IV supplementation. 10/01: Transition from IV calcium  gluconate infusion to oral calcium  and monitor -continue IV replacement of mg and calcium   Continue oral calcium .   2-Hypophosphatemia: -Replete IV   3-Nausea vomiting and diarrhea: - No further diarrhea since yesterday morning. - GI pathogen and C. difficile ordered - Denies fever or vomiting.  Will advance diet to soft today. - Likely in the setting of chemotherapy Improved.   4-Prolonged QT: - In the setting of multiple electrolytes abnormalities. Prolonged QT improved  Transaminases, pancreatic head lesion with duodenal obstruction and status post G-tube/biliary obstruction due to malignancy - Right upper quadrant ultrasound with common bile duct dilatation at 13 mm which compares to reported 12 mm diameter on previous MRI from 03/07/2024.   Subtle heterogenicity of hepatic echotexture.   - Patient noted to have been started on chemotherapy with Enhertu  to reduce tumor burden, alleviate abdominal symptoms and improve bowel flow. - Liver function test trending down  Metastatic breast cancer/chronic cancer-related pain - Under care of Dr. Valere, receiving Enhertu   Anemia secondary to malignancy and  oral chemotherapy: - Continue to monitor hemoglobin Hb down to 7. No evidence of active bleeding. Will proceed with one unit PRBC transfusion.   See wound care documentation below  Wound 02/01/24 1630 Pressure Injury Buttocks Left Stage 2 -  Partial thickness loss of dermis presenting as a shallow open injury with a red, pink wound bed without slough. (Active)     Wound 02/01/24 1630 Pressure Injury Buttocks Right Stage 2 -  Partial thickness loss of dermis presenting as a shallow open injury with a red, pink wound bed without slough. (Active)  Wound care  Estimated body mass index is 21.91 kg/m as calculated from the following:   Height as of this encounter: 5' 11 (1.803 m).   Weight as of this encounter: 71.3 kg.   DVT prophylaxis: Lovenox  Code Status: Full code Family Communication: Care discussed with patient Disposition Plan:  Status is: Inpatient Remains inpatient appropriate because: Management of multiple electrolytes abnormalities    Consultants:  Dr. Godina added to treatment team  Procedures:    Antimicrobials:    Subjective: She vomited once yesterday, was able to eat supper and breakfast today. No further diarrhea.   Objective: Vitals:   03/21/24 0928 03/21/24 1309 03/21/24 2041 03/22/24 0430  BP: 121/71 113/72 113/68 100/61  Pulse: 66 62 69 71  Resp:  18 18 18   Temp:  98 F (36.7 C) 98.5 F (36.9 C) 98.3 F (36.8 C)  TempSrc:  Oral Oral Oral  SpO2: 100% 100% 100% 100%  Weight:      Height:        Intake/Output Summary (Last 24 hours) at 03/22/2024 1236 Last data filed at 03/22/2024  0300 Gross per 24 hour  Intake 2221.34 ml  Output --  Net 2221.34 ml   Filed Weights   03/21/24 0428  Weight: 71.3 kg    Examination:  General exam: NAD Respiratory system: CTA Cardiovascular system: S 1, S 2 RRR Gastrointestinal system: BS present, soft, nt Central nervous system: Alert, and oriented.  Extremities: No edema    Data Reviewed: I have personally reviewed following labs and imaging studies  CBC: Recent Labs  Lab 03/19/24 1232 03/20/24 0255 03/21/24 0154 03/22/24 0527  WBC 4.1 5.0 4.2 3.8*  NEUTROABS 2.8  --  3.3 1.9  HGB 9.0* 7.9* 7.2* 7.0*  HCT 26.1* 24.8* 22.6* 22.4*  MCV 77.7* 80.8 81.0 80.9  PLT 143* 135* 129* 126*   Basic Metabolic Panel: Recent Labs  Lab 03/19/24 1232 03/19/24 2118 03/20/24 0255 03/20/24 0758 03/20/24 0803 03/20/24 1434 03/20/24 1951 03/21/24 0154 03/22/24 0527  NA 137   < > 134* 135  --  133* 135 138 139  K 3.3*   < > 4.2 3.9  --  3.7 3.7 3.4* 3.5  CL 103   < > 102 103  --  101 104 107 107  CO2 23   < > 19* 18*  --  19* 18* 19* 21*  GLUCOSE 89   < > 139* 110*  --  126* 111* 94 82  BUN 8   < > 7 8  --  6 <5* <5* <5*  CREATININE 0.41*   < > 0.37* 0.39*  --  0.36* 0.40* 0.33* 0.31*  CALCIUM  4.5*   < > 6.1* 6.5*  --  6.6* 6.6* 6.9* 5.8*  MG 1.0*  --  1.8  --   --   --   --  1.8 1.6*  PHOS  --   --   --   --  2.1*  --   --  2.2* 2.3*   < > = values in this interval not displayed.   GFR: Estimated Creatinine Clearance: 93 mL/min (A) (by C-G formula based on SCr of 0.31 mg/dL (L)). Liver Function Tests: Recent Labs  Lab 03/19/24 1232 03/20/24 0255 03/21/24 0154  AST 126* 127* 103*  ALT 130* 145* 143*  ALKPHOS 1,529* 1,440* 1,355*  BILITOT 0.6 0.6 0.4  PROT 6.4* 5.8* 5.4*  ALBUMIN  3.4* 3.2* 3.1*   No results for input(s): LIPASE, AMYLASE in the last 168 hours. No results for input(s): AMMONIA in the last 168 hours. Coagulation Profile: No results for input(s): INR, PROTIME in the last 168  hours. Cardiac Enzymes: No results for input(s): CKTOTAL, CKMB, CKMBINDEX, TROPONINI in the last 168 hours. BNP (last 3 results) No results for input(s): PROBNP in the last 8760 hours. HbA1C: No results for input(s): HGBA1C in the last 72 hours. CBG: No results for input(s): GLUCAP in the last 168 hours. Lipid Profile: No results for input(s): CHOL, HDL, LDLCALC, TRIG, CHOLHDL, LDLDIRECT in the last 72 hours. Thyroid  Function Tests: No results for input(s): TSH, T4TOTAL, FREET4, T3FREE, THYROIDAB in the last 72 hours. Anemia Panel: No results for input(s): VITAMINB12, FOLATE, FERRITIN, TIBC, IRON, RETICCTPCT in the last 72 hours. Sepsis Labs: No results for input(s): PROCALCITON, LATICACIDVEN in the last 168 hours.  No results found for this or any previous visit (  from the past 240 hours).       Radiology Studies: DG Abd 2 Views Result Date: 03/20/2024 CLINICAL DATA:  Nausea vomiting EXAM: ABDOMEN - 2 VIEW COMPARISON:  Ultrasound abdomen March 20, 2024 FINDINGS: Paucity of bowel gas. No bowel obstruction identified. Gastrostomy tube in place. There is no evidence of free air. No radio-opaque calculi or other significant radiographic abnormality is seen. Heterogeneous sclerotic changes throughout the axial and appendicular skeleton consistent with sclerotic metastasis (patient has history of breast cancer). IMPRESSION: No bowel obstruction. Sclerotic osseous metastasis. Electronically Signed   By: Megan  Zare M.D.   On: 03/20/2024 19:55        Scheduled Meds:  sodium chloride    Intravenous Once   calcium  carbonate  1,000 mg of elemental calcium  Oral BID WC   Chlorhexidine  Gluconate Cloth  6 each Topical Daily   enoxaparin  (LOVENOX ) injection  40 mg Subcutaneous Q24H   fentaNYL   1 patch Transdermal Q72H   magnesium  oxide  200 mg Oral BID   nystatin  5 mL Oral QID   prochlorperazine   10 mg Intravenous TID   sodium chloride   flush  10-40 mL Intracatheter Q12H   sodium chloride  flush  3 mL Intravenous Q12H   Continuous Infusions:  potassium PHOSPHATE  IVPB (in mmol) 15 mmol (03/22/24 1135)     LOS: 2 days    Time spent: 35 minutes.     Owen DELENA Lore, MD [facility]   If 7PM-7AM, please contact night-coverage [email or web address]  03/22/2024, 12:36 PM

## 2024-03-22 NOTE — Progress Notes (Signed)
 Pt has not has BM for the past 24 hrs.  Will continue to monitor and send to lab to analysis for C-diff.

## 2024-03-23 DIAGNOSIS — R197 Diarrhea, unspecified: Secondary | ICD-10-CM | POA: Diagnosis not present

## 2024-03-23 DIAGNOSIS — R112 Nausea with vomiting, unspecified: Secondary | ICD-10-CM | POA: Diagnosis not present

## 2024-03-23 LAB — COMPREHENSIVE METABOLIC PANEL WITH GFR
ALT: 145 U/L — ABNORMAL HIGH (ref 0–44)
AST: 89 U/L — ABNORMAL HIGH (ref 15–41)
Albumin: 3.1 g/dL — ABNORMAL LOW (ref 3.5–5.0)
Alkaline Phosphatase: 1395 U/L — ABNORMAL HIGH (ref 38–126)
Anion gap: 12 (ref 5–15)
BUN: <5 mg/dL — ABNORMAL LOW (ref 6–20)
CO2: 21 mmol/L — ABNORMAL LOW (ref 22–32)
Calcium: 5.2 mg/dL — CL (ref 8.9–10.3)
Chloride: 105 mmol/L (ref 98–111)
Creatinine, Ser: 0.37 mg/dL — ABNORMAL LOW (ref 0.44–1.00)
GFR, Estimated: >60 mL/min (ref >60)
Glucose, Bld: 84 mg/dL (ref 70–99)
Potassium: 3.4 mmol/L — ABNORMAL LOW (ref 3.5–5.1)
Sodium: 138 mmol/L (ref 135–145)
Total Bilirubin: 0.4 mg/dL (ref 0.0–1.2)
Total Protein: 5.3 g/dL — ABNORMAL LOW (ref 6.5–8.1)

## 2024-03-23 LAB — TYPE AND SCREEN
ABO/RH(D): B POS
Antibody Screen: NEGATIVE
Unit division: 0

## 2024-03-23 LAB — CBC WITH DIFFERENTIAL/PLATELET
Abs Immature Granulocytes: 0.32 K/uL — ABNORMAL HIGH (ref 0.00–0.07)
Basophils Absolute: 0 K/uL (ref 0.0–0.1)
Basophils Relative: 1 %
Eosinophils Absolute: 0.1 K/uL (ref 0.0–0.5)
Eosinophils Relative: 2 %
HCT: 25.7 % — ABNORMAL LOW (ref 36.0–46.0)
Hemoglobin: 8.2 g/dL — ABNORMAL LOW (ref 12.0–15.0)
Immature Granulocytes: 8 %
Lymphocytes Relative: 26 %
Lymphs Abs: 1.1 K/uL (ref 0.7–4.0)
MCH: 25.9 pg — ABNORMAL LOW (ref 26.0–34.0)
MCHC: 31.9 g/dL (ref 30.0–36.0)
MCV: 81.1 fL (ref 80.0–100.0)
Monocytes Absolute: 0.3 K/uL (ref 0.1–1.0)
Monocytes Relative: 7 %
Neutro Abs: 2.4 K/uL (ref 1.7–7.7)
Neutrophils Relative %: 56 %
Platelets: 124 K/uL — ABNORMAL LOW (ref 150–400)
RBC: 3.17 MIL/uL — ABNORMAL LOW (ref 3.87–5.11)
RDW: 17.1 % — ABNORMAL HIGH (ref 11.5–15.5)
WBC: 4.2 K/uL (ref 4.0–10.5)
nRBC: 0.5 % — ABNORMAL HIGH (ref 0.0–0.2)

## 2024-03-23 LAB — BPAM RBC
Blood Product Expiration Date: 202510302359
ISSUE DATE / TIME: 202510021310
Unit Type and Rh: 7300

## 2024-03-23 LAB — MAGNESIUM: Magnesium: 1.6 mg/dL — ABNORMAL LOW (ref 1.7–2.4)

## 2024-03-23 LAB — PHOSPHORUS: Phosphorus: 2.5 mg/dL (ref 2.5–4.6)

## 2024-03-23 MED ORDER — MAGNESIUM SULFATE 2 GM/50ML IV SOLN
2.0000 g | Freq: Once | INTRAVENOUS | Status: AC
  Administered 2024-03-23: 2 g via INTRAVENOUS
  Filled 2024-03-23: qty 50

## 2024-03-23 MED ORDER — POTASSIUM CHLORIDE 20 MEQ PO PACK
40.0000 meq | PACK | Freq: Once | ORAL | Status: AC
  Administered 2024-03-23: 40 meq via ORAL
  Filled 2024-03-23: qty 2

## 2024-03-23 MED ORDER — CALCIUM GLUCONATE-NACL 1-0.675 GM/50ML-% IV SOLN
1.0000 g | Freq: Once | INTRAVENOUS | Status: AC
  Administered 2024-03-23: 1000 mg via INTRAVENOUS
  Filled 2024-03-23: qty 50

## 2024-03-23 MED ORDER — SODIUM CHLORIDE 0.9 % IV SOLN: INTRAVENOUS | Status: AC

## 2024-03-23 NOTE — Plan of Care (Signed)
  Problem: Pain Managment: Goal: General experience of comfort will improve and/or be controlled Outcome: Progressing   Problem: Safety: Goal: Ability to remain free from injury will improve Outcome: Progressing   Problem: Coping: Goal: Level of anxiety will decrease Outcome: Progressing

## 2024-03-23 NOTE — Progress Notes (Signed)
 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  Patient was admitted for electrolyte abnormalities in the setting of chemotherapy treatment.  Assessment & Plan:   Principal Problem:   Nausea vomiting and diarrhea Active Problems:   Hypokalemia   Hypocalcemia   Carcinomatosis peritonei (HCC)   Malignant neoplasm of upper-outer quadrant of right breast in female, estrogen receptor positive (HCC)   QT prolongation   Elevated LFTs   Pancreatic mass   Hypomagnesemia   Neonatal cows' milk hypocalcemia   Hypocalcemia / Hypomagnesemia / Hypokalemia: Patient presented with nausea, vomiting and diarrhea in the setting of recent chemotherapy treatment. Electrolyte replacement is in progress. She has received calcium  gluconate IV, IV magnesium  sulfate. Continue IV replacement of mg and calcium   Continue oral calcium .    Hypophosphatemia: Replaced.  Improved.   Nausea,  vomiting and diarrhea: No further diarrhea since yesterday morning. Follow up GI pathogen and C. difficile Denies fever or vomiting.  Will advance diet to soft today. Likely in the setting of chemotherapy Improved.    Prolonged QT: In the setting of multiple electrolytes abnormalities. Prolonged QT improved.   Elevated Liver Enzymes:  Pancreatic head lesion with duodenal obstruction and status post G-tube / biliary obstruction due to malignancy: RUQ US  > common bile duct dilatation at 13 mm which  compares to reported 12 mm diameter on previous MRI from 03/07/2024.  Subtle heterogenicity of hepatic echotexture.   Patient was noted to have been started on chemotherapy with Enhertu  to reduce tumor burden, alleviate abdominal symptoms and improve bowel flow. Liver function test trending down   Metastatic breast cancer/chronic cancer-related pain; - Under care of Dr. Valere, receiving Enhertu . - Continue adequate pain control.   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  Hemoglobin improved to 8.7 after 1 unit PRBC   See wound care documentation below   Wound 02/01/24 1630 Pressure Injury Buttocks Left Stage 2 -  Partial thickness loss of dermis presenting as a shallow open injury with a red, pink wound bed without slough. (Active)     Wound 02/01/24 1630 Pressure Injury Buttocks Right Stage 2 -  Partial thickness loss of dermis presenting as a shallow open injury with a red, pink wound bed without slough. (Active)  Wound care   Estimated body mass index is 21.91 kg/m as calculated from the following:   Height as of this encounter: 5' 11 (1.803 m).   Weight as of this encounter: 71.3 kg.   DVT prophylaxis: Lovenox  Code Status: Full code Family Communication: No family at bed side. Disposition Plan:    Status is: Inpatient Remains inpatient appropriate because: Severity of illness.    Consultants:  Oncology  Procedures:  Antimicrobials:  Anti-infectives (From admission, onward)    None      Subjective: Patient was seen and examined at bedside.  Overnight events noted. Patient reports feeling weak and tired,  She has been getting IV fluids.  She denies any nausea and vomiting.  Objective: Vitals:  03/22/24 1333 03/22/24 1542 03/22/24 2056 03/23/24 0600  BP: 101/62 105/69 105/73 97/62  Pulse: 83 74 79 84  Resp: 16  18 18   Temp: 97.6 F (36.4 C) 97.9 F (36.6 C) 98.6  F (37 C) 98.2 F (36.8 C)  TempSrc:  Oral Oral Oral  SpO2:  100% 100% 97%  Weight:      Height:        Intake/Output Summary (Last 24 hours) at 03/23/2024 1044 Last data filed at 03/23/2024 1000 Gross per 24 hour  Intake 830.5 ml  Output 1075 ml  Net -244.5 ml   Filed Weights   03/21/24 0428  Weight: 71.3 kg    Examination:  General exam: Appears calm and comfortable, deconditioned, not in any acute distress. Respiratory system: Clear to auscultation. Respiratory effort normal. RR 13 Cardiovascular system: S1 & S2 heard, RRR. No JVD, murmurs, rubs, gallops or clicks Gastrointestinal system: Abdomen is non distended, soft and non tender. Normal bowel sounds heard. Central nervous system: Alert and oriented X 3. No focal neurological deficits. Extremities: No edema, no cyanosis, no clubbing. Skin: No rashes, lesions or ulcers Psychiatry: Judgement and insight appear normal. Mood & affect appropriate.     Data Reviewed: I have personally reviewed following labs and imaging studies  CBC: Recent Labs  Lab 03/19/24 1232 03/20/24 0255 03/21/24 0154 03/22/24 0527 03/22/24 1750 03/23/24 0251  WBC 4.1 5.0 4.2 3.8*  --  4.2  NEUTROABS 2.8  --  3.3 1.9  --  2.4  HGB 9.0* 7.9* 7.2* 7.0* 8.7* 8.2*  HCT 26.1* 24.8* 22.6* 22.4* 26.6* 25.7*  MCV 77.7* 80.8 81.0 80.9  --  81.1  PLT 143* 135* 129* 126*  --  124*   Basic Metabolic Panel: Recent Labs  Lab 03/19/24 1232 03/19/24 2118 03/20/24 0255 03/20/24 0758 03/20/24 0803 03/20/24 1434 03/20/24 1951 03/21/24 0154 03/22/24 0527 03/22/24 1750 03/23/24 0251  NA 137   < > 134*   < >  --    < > 135 138 139 137 138  K 3.3*   < > 4.2   < >  --    < > 3.7 3.4* 3.5 3.7 3.4*  CL 103   < > 102   < >  --    < > 104 107 107 105 105  CO2 23   < > 19*   < >  --    < > 18* 19* 21* 22 21*  GLUCOSE 89   < > 139*   < >  --    < > 111* 94 82 104* 84  BUN 8   < > 7   < >  --    < > <5* <5* <5* <5* <5*  CREATININE 0.41*   < > 0.37*   <  >  --    < > 0.40* 0.33* 0.31* 0.30* 0.37*  CALCIUM  4.5*   < > 6.1*   < >  --    < > 6.6* 6.9* 5.8* 5.2* 5.2*  MG 1.0*  --  1.8  --   --   --   --  1.8 1.6*  --  1.6*  PHOS  --   --   --   --  2.1*  --   --  2.2* 2.3*  --  2.5   < > = values in this interval not displayed.   GFR: Estimated Creatinine Clearance: 93 mL/min (A) (by C-G formula based on SCr of 0.37 mg/dL (  L)). Liver Function Tests: Recent Labs  Lab 03/19/24 1232 03/20/24 0255 03/21/24 0154 03/23/24 0251  AST 126* 127* 103* 89*  ALT 130* 145* 143* 145*  ALKPHOS 1,529* 1,440* 1,355* 1,395*  BILITOT 0.6 0.6 0.4 0.4  PROT 6.4* 5.8* 5.4* 5.3*  ALBUMIN  3.4* 3.2* 3.1* 3.1*   No results for input(s): LIPASE, AMYLASE in the last 168 hours. No results for input(s): AMMONIA in the last 168 hours. Coagulation Profile: No results for input(s): INR, PROTIME in the last 168 hours. Cardiac Enzymes: No results for input(s): CKTOTAL, CKMB, CKMBINDEX, TROPONINI in the last 168 hours. BNP (last 3 results) No results for input(s): PROBNP in the last 8760 hours. HbA1C: No results for input(s): HGBA1C in the last 72 hours. CBG: No results for input(s): GLUCAP in the last 168 hours. Lipid Profile: No results for input(s): CHOL, HDL, LDLCALC, TRIG, CHOLHDL, LDLDIRECT in the last 72 hours. Thyroid  Function Tests: No results for input(s): TSH, T4TOTAL, FREET4, T3FREE, THYROIDAB in the last 72 hours. Anemia Panel: No results for input(s): VITAMINB12, FOLATE, FERRITIN, TIBC, IRON, RETICCTPCT in the last 72 hours. Sepsis Labs: No results for input(s): PROCALCITON, LATICACIDVEN in the last 168 hours.  No results found for this or any previous visit (from the past 240 hours).   Radiology Studies: No results found.  Scheduled Meds:  sodium chloride    Intravenous Once   calcium  carbonate  1,000 mg of elemental calcium  Oral BID WC   Chlorhexidine  Gluconate Cloth  6 each  Topical Daily   enoxaparin  (LOVENOX ) injection  40 mg Subcutaneous Q24H   fentaNYL   1 patch Transdermal Q72H   magnesium  oxide  200 mg Oral BID   nystatin  5 mL Oral QID   prochlorperazine   10 mg Intravenous TID   sodium chloride  flush  10-40 mL Intracatheter Q12H   sodium chloride  flush  3 mL Intravenous Q12H   Continuous Infusions:   LOS: 3 days    Time spent: 50 mins    Darcel Dawley, MD [facility]   If 7PM-7AM, please contact night-coverage

## 2024-03-23 NOTE — Progress Notes (Signed)
 Referring Physician(s): Chelsea Hansen,V  Supervising Physician: Chelsea Hansen  Patient Status:  [facility] - In-pt  Chief Complaint: Metastatic breast cancer, intractable abdominal/back pain; referred for image guided celiac plexus neurolysis   Subjective: Patient known to IR team from left submandibular lymph node biopsy on 07/06/23, celiac plexus nerve block on 01/16/2024, recent gastrostomy tube exchanges and manipulation of J tube, peritoneal mass biopsy on 02/03/2024, Port-A-Cath placement on 02/06/2024; she is a 53 year old female with past medical history significant for 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  She she has a history of previous temp pain relief from recent celiac plexus nerve block and was scheduled in IR for outpatient celiac plexus neurolysis on 10/6.  Request now received to proceed with celiac plexus neurolysis as an inpatient.  She currently denies fever, headache, chest pain, dyspnea, cough, nausea, vomiting or bleeding.   Past Medical History:  Diagnosis Date   Allergy    seasonal allergies   Anxiety    on meds   Asthma    uses inhaler   Breast cancer (HCC) 2012   RIGHT lumpectomy   Cancer (HCC) 2022   RIGHT breast lump-dx 2022   Depression    on meds   DVT (deep venous thrombosis) (HCC) 2010   after hysterectomy   Family history of uterine cancer    GERD (gastroesophageal reflux disease)    with certain foods/OTC PRN meds   Headache(784.0)    History of radiation therapy    Bilateral breast- 09/03/21-11/02/21- Dr. Lynwood Hansen   Hyperlipidemia    on meds    Hypertension    on meds   SVT (supraventricular tachycardia)    Past Surgical History:  Procedure Laterality Date   ABDOMINAL HYSTERECTOMY     AXILLARY SENTINEL NODE BIOPSY Left 07/09/2021   Procedure: LEFT AXILLARY SENTINEL NODE BIOPSY;  Surgeon: Chelsea Berg, MD;  Location: [facility];  Service: General;  Laterality: Left;   BALLOON DILATION N/A 12/13/2023   Procedure: Chelsea Hansen;  Surgeon: Chelsea Rosario BROCKS, MD;  Location: [facility] ENDOSCOPY;  Service: Gastroenterology;  Laterality: N/A;   BIOPSY  06/29/2022   Procedure: BIOPSY;  Surgeon: Chelsea Victory LITTIE DOUGLAS, MD;  Location: [facility];  Service: Gastroenterology;;   BIOPSY OF SKIN SUBCUTANEOUS TISSUE AND/OR MUCOUS MEMBRANE  12/13/2023   Procedure: BIOPSY, SKIN, SUBCUTANEOUS TISSUE, OR MUCOUS MEMBRANE;  Surgeon: Chelsea Rosario BROCKS, MD;  Location: [facility] ENDOSCOPY;  Service: Gastroenterology;;   BREAST EXCISIONAL BIOPSY Right 09/16/2009   BREAST LUMPECTOMY WITH RADIOACTIVE SEED LOCALIZATION Bilateral 07/09/2021   Procedure: BILATERAL BREAST LUMPECTOMY WITH RADIOACTIVE SEED LOCALIZATION;  Surgeon: Chelsea Berg, MD;  Location: [facility];  Service: General;  Laterality: Bilateral;   BREAST SURGERY     lumpectomy   ESOPHAGOGASTRODUODENOSCOPY N/A 12/13/2023   Procedure: EGD (ESOPHAGOGASTRODUODENOSCOPY);  Surgeon: Chelsea Rosario BROCKS, MD;  Location: [facility] ENDOSCOPY;  Service: Gastroenterology;  Laterality: N/A;   ESOPHAGOGASTRODUODENOSCOPY N/A 12/26/2023   Procedure: EGD (ESOPHAGOGASTRODUODENOSCOPY);  Surgeon: Chelsea Aloha Raddle., MD;  Location: [facility];  Service: Gastroenterology;  Laterality: N/A;   ESOPHAGOGASTRODUODENOSCOPY N/A 12/27/2023   Procedure: EGD (ESOPHAGOGASTRODUODENOSCOPY);  Surgeon: Chelsea Aloha Raddle., MD;  Location: [facility];  Service: Gastroenterology;  Laterality: N/A;   ESOPHAGOGASTRODUODENOSCOPY N/A 03/08/2024  Procedure: EGD (ESOPHAGOGASTRODUODENOSCOPY);  Surgeon: Chelsea Aloha Raddle., MD;  Location: [facility];  Service: Gastroenterology;  Laterality: N/A;   ESOPHAGOGASTRODUODENOSCOPY (EGD) WITH PROPOFOL  N/A 06/29/2022   Procedure: ESOPHAGOGASTRODUODENOSCOPY (EGD) WITH PROPOFOL ;  Surgeon: Chelsea Victory LITTIE DOUGLAS, MD;  Location: [facility];  Service: Gastroenterology;  Laterality: N/A;   EUS N/A 12/26/2023   Procedure: ULTRASOUND, UPPER GI TRACT, ENDOSCOPIC;  Surgeon: Chelsea Aloha Raddle., MD;  Location: [facility];  Service: Gastroenterology;  Laterality: N/A;   EUS N/A 12/27/2023   Procedure: ULTRASOUND, UPPER GI TRACT, ENDOSCOPIC;  Surgeon: Chelsea Aloha Raddle., MD;  Location: [facility];  Service: Gastroenterology;  Laterality: N/A;   EUS N/A 03/08/2024   Procedure: ULTRASOUND, UPPER GI TRACT, ENDOSCOPIC;  Surgeon: Chelsea Aloha Raddle., MD;  Location: [facility];  Service: Gastroenterology;  Laterality: N/A;   IR CM INJ ANY COLONIC TUBE W/FLUORO  01/20/2024   IR IMAGING GUIDED PORT INSERTION  02/06/2024   IR REPLC DUODEN/JEJUNO TUBE PERCUT W/FLUORO  02/03/2024   IR REPLC GASTRO/COLONIC TUBE PERCUT W/FLUORO  01/20/2024   LAPAROSCOPIC INSERTION GASTROSTOMY TUBE N/A 01/10/2024   Procedure: laparoscopic insertion of gastrostomy tube, laparoscopic insertion of jejunostomy tube, upper endoscopy;  Surgeon: Kinsinger, Chelsea Righter, MD;  Location: [facility];  Service: General;  Laterality: N/A;  Laproscopic insertion   LAPAROSCOPIC ROUX-EN-Y GASTRIC BYPASS WITH HIATAL HERNIA REPAIR N/A 01/02/2024   Procedure: LAPAROSCOPIC CREATION GASTRJEJONOSTOMY;  Surgeon: Chelsea Hansen PARAS, MD;  Location: [facility];  Service: General;  Laterality: N/A;  GASTROJEJUNOSTOMY   PORT-A-CATH REMOVAL Left 07/09/2021   Procedure: REMOVAL PORT-A-CATH;  Surgeon: Chelsea Berg, MD;  Location: [facility];  Service: General;  Laterality: Left;   PORTACATH PLACEMENT Left 01/26/2021   Procedure: INSERTION PORT-A-CATH;  Surgeon: Chelsea Berg, MD;  Location: [facility];  Service: General;   Laterality: Left;   RADIOACTIVE SEED GUIDED AXILLARY SENTINEL LYMPH NODE Right 07/09/2021   Procedure: RADIOACTIVE SEED GUIDED RIGHT AXILLARY SENTINEL LYMPH NODE DISSECTION;  Surgeon: Chelsea Berg, MD;  Location: [facility];  Service: General;  Laterality: Right;   TUBAL LIGATION        Allergies: Enhertu  [fam-trastuzumab  deruxtec-nxki]  Medications: Prior to Admission medications   Medication Sig Start Date End Date Taking? Authorizing Provider  acetaminophen  (TYLENOL ) 325 MG tablet Take 2 tablets (650 mg total) by mouth every 6 (six) hours as needed for mild pain (pain score 1-3) or fever (or Fever >/= 101). 03/09/24  Yes Rosario Eland I, MD  fentaNYL  (DURAGESIC ) 25 MCG/HR Place 1 patch onto the skin every 3 (three) days. 03/11/24  Yes Rosario Eland FERNS, MD  lidocaine -prilocaine  (EMLA ) cream Apply to affected area once Patient taking differently: Apply 1 Application topically daily as needed (for port). 02/08/24  Yes [name], [name], MD  ondansetron  (ZOFRAN ) 8 MG tablet Take 1 tablet (8 mg total) by mouth every 8 (eight) hours as needed for nausea or vomiting. Start on the third day after chemotherapy. 02/08/24  Yes [name], [name], MD  oxyCODONE  (ROXICODONE ) 15 MG immediate release tablet Take 1 tablet (15 mg total) by mouth every 4 (four) hours as needed for pain. 03/12/24  Yes [name], [name], NP  prochlorperazine  (COMPAZINE ) 10 MG tablet Take 1 tablet (10 mg total) by mouth every 6 (six) hours as needed for nausea or vomiting. 02/08/24  Yes [name], MD     Vital Signs: BP 91/60 (BP Location: Right Arm)   Pulse 79   Temp 98.2 F (36.8 C) (Oral)   Resp 17  Ht 5' 11 (1.803 m)   Wt 157 lb 1.6 oz (71.3 kg)   SpO2 100%   BMI 21.91 kg/m   Physical Exam: Awake, alert.  Chest clear to auscultation bilaterally.  Clean, intact right chest wall Port-A-Cath.  Heart with regular rate and rhythm.  Abdomen soft, positive bowel sounds, some generalized  tenderness to palpation.  No lower extremity edema.   Imaging: DG Abd 2 Views Result Date: 03/20/2024 CLINICAL DATA:  Nausea vomiting EXAM: ABDOMEN - 2 VIEW COMPARISON:  Ultrasound abdomen March 20, 2024 FINDINGS: Paucity of bowel gas. No bowel obstruction identified. Gastrostomy tube in place. There is no evidence of free air. No radio-opaque calculi or other significant radiographic abnormality is seen. Heterogeneous sclerotic changes throughout the axial and appendicular skeleton consistent with sclerotic metastasis (patient has history of breast cancer). IMPRESSION: No bowel obstruction. Sclerotic osseous metastasis. Electronically Signed   By: Megan  Zare M.D.   On: 03/20/2024 19:55   US  Abdomen Limited RUQ (LIVER/GB) Result Date: 03/20/2024 CLINICAL DATA:  Elevated LFTs. EXAM: ULTRASOUND ABDOMEN LIMITED RIGHT UPPER QUADRANT COMPARISON:  MRI abdomen 03/07/2024 FINDINGS: Gallbladder: No gallstones or wall thickening visualized. No sonographic Murphy sign noted by sonographer. Common bile duct: Diameter: 13 mm. This compares to a reported 12 mm diameter on the previous MRI. Liver: Subtle heterogeneity of hepatic echotexture. Portal vein is patent on color Doppler imaging with normal direction of blood flow towards the liver. Other: None. IMPRESSION: 1. Common bile duct dilatation at 13 mm. This compares to a reported 12 mm diameter on the previous MRI from 03/07/2024. 2. Subtle heterogeneity of hepatic echotexture. Electronically Signed   By: Camellia Candle M.D.   On: 03/20/2024 06:22    Labs:  CBC: Recent Labs    03/20/24 0255 03/21/24 0154 03/22/24 0527 03/22/24 1750 03/23/24 0251  WBC 5.0 4.2 3.8*  --  4.2  HGB 7.9* 7.2* 7.0* 8.7* 8.2*  HCT 24.8* 22.6* 22.4* 26.6* 25.7*  PLT 135* 129* 126*  --  124*    COAGS: Recent Labs    12/10/23 2004 12/22/23 2233 02/01/24 1826 02/02/24 0615 02/03/24 0337 02/27/24 1845  INR 1.1   < > 1.2 1.2 1.2 1.2  APTT 28  --   --   --   --   --     < > = values in this interval not displayed.    BMP: Recent Labs    03/21/24 0154 03/22/24 0527 03/22/24 1750 03/23/24 0251  NA 138 139 137 138  K 3.4* 3.5 3.7 3.4*  CL 107 107 105 105  CO2 19* 21* 22 21*  GLUCOSE 94 82 104* 84  BUN <5* <5* <5* <5*  CALCIUM  6.9* 5.8* 5.2* 5.2*  CREATININE 0.33* 0.31* 0.30* 0.37*  GFRNONAA >60 >60 >60 >60    LIVER FUNCTION TESTS: Recent Labs    03/19/24 1232 03/20/24 0255 03/21/24 0154 03/23/24 0251  BILITOT 0.6 0.6 0.4 0.4  AST 126* 127* 103* 89*  ALT 130* 145* 143* 145*  ALKPHOS 1,529* 1,440* 1,355* 1,395*  PROT 6.4* 5.8* 5.4* 5.3*  ALBUMIN  3.4* 3.2* 3.1* 3.1*    Assessment and Plan: 53 year old female with past medical history significant for 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 Patient  known to IR team from left submandibular lymph node biopsy on 07/06/23, celiac plexus nerve block on 01/16/2024, recent gastrostomy tube exchanges and manipulation of J tube, peritoneal mass biopsy on 02/03/2024, Port-A-Cath placement on 02/06/2024;she she has a history of previous temp pain relief from recent celiac plexus nerve block and was scheduled in IR for outpatient celiac plexus neurolysis on 10/6.  Request now received to proceed with celiac plexus neurolysis as an inpatient.  Details/risks of procedure, including but not limited to, internal bleeding, infection, injury to adjacent structures, diarrhea, inability to eradicate abdominal/back pain discussed with patient with her understanding and consent.  Procedure tentatively scheduled for 10/6.   Electronically  Signed: D. Franky Rakers, PA-C 03/23/2024, 4:55 PM   I spent a total of 25 Minutes at the the patient's bedside AND on the patient's hospital floor or unit, greater than 50% of which was counseling/coordinating care for image guided celiac plexus neurolysis   Patient ID: Chelsea Hansen, female   DOB: [birth year], 53 y.o.   MRN: [id number]

## 2024-03-24 DIAGNOSIS — R197 Diarrhea, unspecified: Secondary | ICD-10-CM | POA: Diagnosis not present

## 2024-03-24 DIAGNOSIS — R112 Nausea with vomiting, unspecified: Secondary | ICD-10-CM | POA: Diagnosis not present

## 2024-03-24 LAB — COMPREHENSIVE METABOLIC PANEL WITH GFR
ALT: 108 U/L — ABNORMAL HIGH (ref 0–44)
AST: 57 U/L — ABNORMAL HIGH (ref 15–41)
Albumin: 3.1 g/dL — ABNORMAL LOW (ref 3.5–5.0)
Alkaline Phosphatase: 1398 U/L — ABNORMAL HIGH (ref 38–126)
Anion gap: 11 (ref 5–15)
BUN: <5 mg/dL — ABNORMAL LOW (ref 6–20)
CO2: 23 mmol/L (ref 22–32)
Calcium: 5 mg/dL — CL (ref 8.9–10.3)
Chloride: 104 mmol/L (ref 98–111)
Creatinine, Ser: 0.34 mg/dL — ABNORMAL LOW (ref 0.44–1.00)
GFR, Estimated: >60 mL/min (ref >60)
Glucose, Bld: 102 mg/dL — ABNORMAL HIGH (ref 70–99)
Potassium: 3.2 mmol/L — ABNORMAL LOW (ref 3.5–5.1)
Sodium: 137 mmol/L (ref 135–145)
Total Bilirubin: 0.4 mg/dL (ref 0.0–1.2)
Total Protein: 5.4 g/dL — ABNORMAL LOW (ref 6.5–8.1)

## 2024-03-24 LAB — CBC WITH DIFFERENTIAL/PLATELET
Abs Immature Granulocytes: 0.27 K/uL — ABNORMAL HIGH (ref 0.00–0.07)
Basophils Absolute: 0 K/uL (ref 0.0–0.1)
Basophils Relative: 1 %
Eosinophils Absolute: 0.1 K/uL (ref 0.0–0.5)
Eosinophils Relative: 2 %
HCT: 26.7 % — ABNORMAL LOW (ref 36.0–46.0)
Hemoglobin: 8.4 g/dL — ABNORMAL LOW (ref 12.0–15.0)
Immature Granulocytes: 7 %
Lymphocytes Relative: 26 %
Lymphs Abs: 1.1 K/uL (ref 0.7–4.0)
MCH: 25.6 pg — ABNORMAL LOW (ref 26.0–34.0)
MCHC: 31.5 g/dL (ref 30.0–36.0)
MCV: 81.4 fL (ref 80.0–100.0)
Monocytes Absolute: 0.3 K/uL (ref 0.1–1.0)
Monocytes Relative: 7 %
Neutro Abs: 2.4 K/uL (ref 1.7–7.7)
Neutrophils Relative %: 57 %
Platelets: 114 K/uL — ABNORMAL LOW (ref 150–400)
RBC: 3.28 MIL/uL — ABNORMAL LOW (ref 3.87–5.11)
RDW: 17.3 % — ABNORMAL HIGH (ref 11.5–15.5)
WBC: 4.1 K/uL (ref 4.0–10.5)
nRBC: 0.5 % — ABNORMAL HIGH (ref 0.0–0.2)

## 2024-03-24 LAB — MAGNESIUM: Magnesium: 1.8 mg/dL (ref 1.7–2.4)

## 2024-03-24 LAB — PROTIME-INR
INR: 1.1 (ref 0.8–1.2)
Prothrombin Time: 15.1 s (ref 11.4–15.2)

## 2024-03-24 LAB — PHOSPHORUS: Phosphorus: 2.3 mg/dL — ABNORMAL LOW (ref 2.5–4.6)

## 2024-03-24 MED ORDER — POTASSIUM PHOSPHATES 15 MMOLE/5ML IV SOLN
30.0000 mmol | Freq: Once | INTRAVENOUS | Status: DC
  Filled 2024-03-24: qty 10

## 2024-03-24 MED ORDER — MAGNESIUM SULFATE 2 GM/50ML IV SOLN
2.0000 g | Freq: Once | INTRAVENOUS | Status: AC
Start: 2024-03-24 — End: 2024-03-24
  Administered 2024-03-24: 2 g via INTRAVENOUS
  Filled 2024-03-24: qty 50

## 2024-03-24 MED ORDER — POTASSIUM CHLORIDE CRYS ER 20 MEQ PO TBCR: 40.0000 meq | EXTENDED_RELEASE_TABLET | Freq: Once | ORAL | Status: DC

## 2024-03-24 MED ORDER — CALCIUM GLUCONATE-NACL 1-0.675 GM/50ML-% IV SOLN
1.0000 g | Freq: Once | INTRAVENOUS | Status: AC
  Administered 2024-03-24: 1000 mg via INTRAVENOUS
  Filled 2024-03-24: qty 50

## 2024-03-24 MED ORDER — CALCIUM GLUCONATE-NACL 2-0.675 GM/100ML-% IV SOLN
2.0000 g | INTRAVENOUS | Status: AC
  Administered 2024-03-24: 2000 mg via INTRAVENOUS
  Filled 2024-03-24: qty 100

## 2024-03-24 MED ORDER — POTASSIUM CHLORIDE 10 MEQ/100ML IV SOLN
10.0000 meq | INTRAVENOUS | Status: AC
  Administered 2024-03-24 (×3): 10 meq via INTRAVENOUS
  Filled 2024-03-24 (×3): qty 100

## 2024-03-24 MED ORDER — POTASSIUM PHOSPHATES 15 MMOLE/5ML IV SOLN
30.0000 mmol | Freq: Once | INTRAVENOUS | Status: AC
  Administered 2024-03-24: 30 mmol via INTRAVENOUS
  Filled 2024-03-24: qty 10

## 2024-03-24 NOTE — Progress Notes (Addendum)
 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  Patient was admitted for electrolyte abnormalities in the setting of chemotherapy treatment.  Assessment & Plan:   Principal Problem:   Nausea vomiting and diarrhea Active Problems:   Hypokalemia   Hypocalcemia   Carcinomatosis peritonei (HCC)   Malignant neoplasm of upper-outer quadrant of right breast in female, estrogen receptor positive (HCC)   QT prolongation   Elevated LFTs   Pancreatic mass   Hypomagnesemia   Neonatal cows' milk hypocalcemia   Hypocalcemia / Hypomagnesemia / Hypokalemia: Patient presented with nausea, vomiting and diarrhea in the setting of recent chemotherapy treatment. Electrolyte replacement is in progress. She has received IV calcium  gluconate, IV magnesium  sulfate. Continue IV replacement of mg and calcium   Continue oral calcium .    Hypophosphatemia: Replaced.  Continue to monitor   Nausea,  vomiting and diarrhea: No further diarrhea since yesterday morning. C. difficile and GI pathogen is still pending Likely in the setting of chemotherapy She threw up again today morning.   Prolonged QT: In the setting of multiple electrolytes abnormalities. Prolonged QT improved.   Elevated Liver Enzymes:  Pancreatic head lesion with duodenal obstruction and status post G-tube / biliary obstruction due to malignancy: RUQ US  > common bile duct dilatation at 13 mm which compares to reported 12  mm diameter on previous MRI from 03/07/2024.  Subtle heterogenicity of hepatic echotexture.   Patient was noted to have been started on chemotherapy with Enhertu  to reduce tumor burden, alleviate abdominal symptoms and improve bowel flow. Liver function test trending down   Metastatic breast cancer/chronic cancer-related pain; - Under care of Dr. Valere, receiving Enhertu . - Continue adequate pain control. - She was scheduled to have outpatient celiac plexus neurolysis on Monday. - IR consulted to get this as an inpatient since patient is hospitalized.   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  Hemoglobin improved to 8.7 after 1 unit PRBC   See wound care documentation below   Wound 02/01/24 1630 Pressure Injury Buttocks Left Stage 2 -  Partial thickness loss of dermis presenting as a shallow open injury with a red, pink wound bed without slough. (Active)     Wound 02/01/24 1630 Pressure Injury Buttocks Right Stage 2 -  Partial thickness loss of dermis presenting as a shallow open injury with a red, pink wound bed without slough. (Active)  Wound care   Estimated body mass index is 21.91 kg/m as calculated from the following:   Height as of this encounter: 5' 11 (1.803 m).   Weight as of this encounter: 71.3 kg.   DVT prophylaxis: Lovenox  Code Status: Full code Family Communication: No family at bed side. Disposition Plan:    Status is: Inpatient Remains inpatient appropriate because: Severity of illness.    Consultants:  Oncology  Procedures:  Antimicrobials:  Anti-infectives (From admission, onward)    None      Subjective: Patient was seen and examined at bedside.  Overnight events noted. Patient reports  feeling weak and tired, She has been getting IV fluids.  She threw up again, electrolytes are still deranged,  needs replacement.  Objective: Vitals:   03/23/24 1144  03/23/24 2028 03/24/24 0512 03/24/24 1118  BP: 91/60 99/63 107/68 105/73  Pulse: 79 77 87 78  Resp: 17 19 19 19   Temp: 98.2 F (36.8 C) 98.5 F (36.9 C) 98.2 F (36.8 C) 97.9 F (36.6 C)  TempSrc: Oral Oral Oral Oral  SpO2: 100% 100% 98% 99%  Weight:      Height:        Intake/Output Summary (Last 24 hours) at 03/24/2024 1200 Last data filed at 03/24/2024 0800 Gross per 24 hour  Intake 863.24 ml  Output 0 ml  Net 863.24 ml   Filed Weights   03/21/24 0428  Weight: 71.3 kg    Examination:  General exam: Appears calm and comfortable, deconditioned, not in any acute distress. Respiratory system: CTA Bilaterally. Respiratory effort normal. RR 13 Cardiovascular system: S1 & S2 heard, RRR. No JVD, murmurs, rubs, gallops or clicks Gastrointestinal system: Abdomen is non distended, soft and non tender. Normal bowel sounds heard. Central nervous system: Alert and oriented X 3. No focal neurological deficits. Extremities: No edema, no cyanosis, no clubbing. Skin: No rashes, lesions or ulcers Psychiatry: Judgement and insight appear normal. Mood & affect appropriate.     Data Reviewed: I have personally reviewed following labs and imaging studies  CBC: Recent Labs  Lab 03/19/24 1232 03/19/24 1232 03/20/24 0255 03/21/24 0154 03/22/24 0527 03/22/24 1750 03/23/24 0251 03/24/24 0030  WBC 4.1  --  5.0 4.2 3.8*  --  4.2 4.1  NEUTROABS 2.8  --   --  3.3 1.9  --  2.4 2.4  HGB 9.0*   < > 7.9* 7.2* 7.0* 8.7* 8.2* 8.4*  HCT 26.1*  --  24.8* 22.6* 22.4* 26.6* 25.7* 26.7*  MCV 77.7*  --  80.8 81.0 80.9  --  81.1 81.4  PLT 143*  --  135* 129* 126*  --  124* 114*   < > = values in this interval not displayed.   Basic Metabolic Panel: Recent Labs  Lab 03/20/24 0255 03/20/24 0758 03/20/24 0803 03/20/24 1434 03/21/24 0154 03/22/24 0527 03/22/24 1750 03/23/24 0251 03/24/24 0030  NA 134*   < >  --    < > 138 139 137 138 137  K 4.2   < >  --    < > 3.4* 3.5 3.7 3.4* 3.2*  CL  102   < >  --    < > 107 107 105 105 104  CO2 19*   < >  --    < > 19* 21* 22 21* 23  GLUCOSE 139*   < >  --    < > 94 82 104* 84 102*  BUN 7   < >  --    < > <5* <5* <5* <5* <5*  CREATININE 0.37*   < >  --    < > 0.33* 0.31* 0.30* 0.37* 0.34*  CALCIUM  6.1*   < >  --    < > 6.9* 5.8* 5.2* 5.2* 5.0*  MG 1.8  --   --   --  1.8 1.6*  --  1.6* 1.8  PHOS  --   --  2.1*  --  2.2* 2.3*  --  2.5 2.3*   < > = values in this interval not displayed.   GFR: Estimated Creatinine Clearance: 93 mL/min (A) (by  C-G formula based on SCr of 0.34 mg/dL (L)). Liver Function Tests: Recent Labs  Lab 03/19/24 1232 03/20/24 0255 03/21/24 0154 03/23/24 0251 03/24/24 0030  AST 126* 127* 103* 89* 57*  ALT 130* 145* 143* 145* 108*  ALKPHOS 1,529* 1,440* 1,355* 1,395* 1,398*  BILITOT 0.6 0.6 0.4 0.4 0.4  PROT 6.4* 5.8* 5.4* 5.3* 5.4*  ALBUMIN  3.4* 3.2* 3.1* 3.1* 3.1*   No results for input(s): LIPASE, AMYLASE in the last 168 hours. No results for input(s): AMMONIA in the last 168 hours. Coagulation Profile: Recent Labs  Lab 03/24/24 0030  INR 1.1   Cardiac Enzymes: No results for input(s): CKTOTAL, CKMB, CKMBINDEX, TROPONINI in the last 168 hours. BNP (last 3 results) No results for input(s): PROBNP in the last 8760 hours. HbA1C: No results for input(s): HGBA1C in the last 72 hours. CBG: No results for input(s): GLUCAP in the last 168 hours. Lipid Profile: No results for input(s): CHOL, HDL, LDLCALC, TRIG, CHOLHDL, LDLDIRECT in the last 72 hours. Thyroid  Function Tests: No results for input(s): TSH, T4TOTAL, FREET4, T3FREE, THYROIDAB in the last 72 hours. Anemia Panel: No results for input(s): VITAMINB12, FOLATE, FERRITIN, TIBC, IRON, RETICCTPCT in the last 72 hours. Sepsis Labs: No results for input(s): PROCALCITON, LATICACIDVEN in the last 168 hours.  No results found for this or any previous visit (from the past 240 hours).    Radiology Studies: No results found.  Scheduled Meds:  calcium  carbonate  1,000 mg of elemental calcium  Oral BID WC   Chlorhexidine  Gluconate Cloth  6 each Topical Daily   enoxaparin  (LOVENOX ) injection  40 mg Subcutaneous Q24H   fentaNYL   1 patch Transdermal Q72H   magnesium  oxide  200 mg Oral BID   nystatin  5 mL Oral QID   prochlorperazine   10 mg Intravenous TID   sodium chloride  flush  10-40 mL Intracatheter Q12H   sodium chloride  flush  3 mL Intravenous Q12H   Continuous Infusions:  sodium chloride  40 mL/hr at 03/23/24 1818   calcium  gluconate 2,000 mg (03/24/24 1152)   potassium PHOSPHATE  IVPB (in mmol) 30 mmol (03/24/24 0817)     LOS: 4 days    Time spent: 50 mins    Darcel Dawley, MD [facility]   If 7PM-7AM, please contact night-coverage

## 2024-03-25 DIAGNOSIS — R197 Diarrhea, unspecified: Secondary | ICD-10-CM | POA: Diagnosis not present

## 2024-03-25 DIAGNOSIS — R112 Nausea with vomiting, unspecified: Secondary | ICD-10-CM | POA: Diagnosis not present

## 2024-03-25 LAB — COMPREHENSIVE METABOLIC PANEL WITH GFR
ALT: 84 U/L — ABNORMAL HIGH (ref 0–44)
AST: 39 U/L (ref 15–41)
Albumin: 3.1 g/dL — ABNORMAL LOW (ref 3.5–5.0)
Alkaline Phosphatase: 1316 U/L — ABNORMAL HIGH (ref 38–126)
Anion gap: 11 (ref 5–15)
BUN: <5 mg/dL — ABNORMAL LOW (ref 6–20)
CO2: 20 mmol/L — ABNORMAL LOW (ref 22–32)
Calcium: 4.8 mg/dL — CL (ref 8.9–10.3)
Chloride: 105 mmol/L (ref 98–111)
Creatinine, Ser: 0.35 mg/dL — ABNORMAL LOW (ref 0.44–1.00)
GFR, Estimated: >60 mL/min (ref >60)
Glucose, Bld: 92 mg/dL (ref 70–99)
Potassium: 3.6 mmol/L (ref 3.5–5.1)
Sodium: 136 mmol/L (ref 135–145)
Total Bilirubin: 0.4 mg/dL (ref 0.0–1.2)
Total Protein: 5.4 g/dL — ABNORMAL LOW (ref 6.5–8.1)

## 2024-03-25 LAB — CBC WITH DIFFERENTIAL/PLATELET
Abs Immature Granulocytes: 0.21 K/uL — ABNORMAL HIGH (ref 0.00–0.07)
Basophils Absolute: 0 K/uL (ref 0.0–0.1)
Basophils Relative: 1 %
Eosinophils Absolute: 0.1 K/uL (ref 0.0–0.5)
Eosinophils Relative: 3 %
HCT: 27.4 % — ABNORMAL LOW (ref 36.0–46.0)
Hemoglobin: 8.5 g/dL — ABNORMAL LOW (ref 12.0–15.0)
Immature Granulocytes: 5 %
Lymphocytes Relative: 28 %
Lymphs Abs: 1.3 K/uL (ref 0.7–4.0)
MCH: 25.4 pg — ABNORMAL LOW (ref 26.0–34.0)
MCHC: 31 g/dL (ref 30.0–36.0)
MCV: 82 fL (ref 80.0–100.0)
Monocytes Absolute: 0.3 K/uL (ref 0.1–1.0)
Monocytes Relative: 8 %
Neutro Abs: 2.5 K/uL (ref 1.7–7.7)
Neutrophils Relative %: 55 %
Platelets: 115 K/uL — ABNORMAL LOW (ref 150–400)
RBC: 3.34 MIL/uL — ABNORMAL LOW (ref 3.87–5.11)
RDW: 17.7 % — ABNORMAL HIGH (ref 11.5–15.5)
WBC: 4.4 K/uL (ref 4.0–10.5)
nRBC: 0 % (ref 0.0–0.2)

## 2024-03-25 LAB — MAGNESIUM: Magnesium: 1.7 mg/dL (ref 1.7–2.4)

## 2024-03-25 LAB — PHOSPHORUS: Phosphorus: 2.4 mg/dL — ABNORMAL LOW (ref 2.5–4.6)

## 2024-03-25 LAB — CALCIUM: Calcium: 5.6 mg/dL — CL (ref 8.9–10.3)

## 2024-03-25 MED ORDER — SODIUM CHLORIDE 0.9 % IV SOLN
1.0000 mg/kg/h | INTRAVENOUS | Status: DC
  Administered 2024-03-25 – 2024-03-28 (×6): 1 mg/kg/h via INTRAVENOUS
  Filled 2024-03-25 (×8): qty 100

## 2024-03-25 MED ORDER — POTASSIUM PHOSPHATES 15 MMOLE/5ML IV SOLN
30.0000 mmol | Freq: Once | INTRAVENOUS | Status: AC
  Administered 2024-03-25: 30 mmol via INTRAVENOUS
  Filled 2024-03-25: qty 10

## 2024-03-25 MED ORDER — CALCIUM GLUCONATE-NACL 2-0.675 GM/100ML-% IV SOLN
2.0000 g | Freq: Once | INTRAVENOUS | Status: AC
  Administered 2024-03-25: 2000 mg via INTRAVENOUS
  Filled 2024-03-25: qty 100

## 2024-03-25 MED ORDER — CALCIUM GLUCONATE-NACL 2-0.675 GM/100ML-% IV SOLN
2.0000 g | INTRAVENOUS | Status: AC
  Administered 2024-03-25: 2000 mg via INTRAVENOUS
  Filled 2024-03-25: qty 100

## 2024-03-25 NOTE — Progress Notes (Signed)
 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  Patient was admitted for electrolyte abnormalities in the setting of chemotherapy treatment.    Assessment/Plan: Principal Problem:   Nausea vomiting and diarrhea Active Problems:   Hypokalemia   Hypocalcemia   Carcinomatosis peritonei (HCC)   Malignant neoplasm of upper-outer quadrant of right breast in female, estrogen receptor positive (HCC)   QT prolongation   Elevated LFTs   Pancreatic mass   Hypomagnesemia   Neonatal cows' milk hypocalcemia  Hypocalcemia / Hypomagnesemia / Hypokalemia: Patient received calcium  gluconate IV, IV magnesium  sulfate and oral calcium .  Will continue to replenish..  Potassium of 3.6 today.  Patient has significant hypocalcemia and will benefit from calcium  infusion.  Discussed with pharmacy for initiation of calcium .  Will check calcium  levels every 6 hourly.   Hypophosphatemia: Replenished.  Phosphorus of 2.4 today.  Receiving potassium phosphate .   Nausea,  vomiting and diarrhea: No further episodes.  Pending GI pathogen panel and C. difficile.   Prolonged QT: Secondary to multiple electrolyte abnormalities.  Continue to monitor.   Metastatic breast cancer with peritoneal carcinomatosis and possible pancreatic mets with pancreatic head lesion with duodenal obstruction and status post G-tube / biliary obstruction due to malignancy: Right upper  quadrant ultrasound showed common bile duct dilatation at 13 mm which compares to reported 12 mm diameter on previous MRI from 03/07/2024.  Subtle heterogenicity of hepatic echotexture.   Patient was noted to have been started on chemotherapy with Enhertu  to reduce tumor burden, alleviate abdominal symptoms and improve bowel flow.  LFTs slightly trending down   Metastatic breast cancer/chronic cancer-related pain; - Under care of Dr. Godina, receiving Enhertu .  Continue pain control.  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  Hemoglobin 8.5 today after 1 unit of packed RBC.   Pressure injury left and right buttocks stage II.  Present on admission.  Continue wound care Wound 02/01/24 1630 Pressure Injury Buttocks Left Stage 2 -  Partial thickness loss of dermis presenting as a shallow open injury with a red, pink wound bed without slough. (Active)     Wound 02/01/24 1630 Pressure Injury Buttocks Right Stage 2 -  Partial thickness loss of dermis presenting as a shallow open injury with a red, pink wound bed without slough. (Active)   DVT prophylaxis: enoxaparin  (LOVENOX ) injection 40 mg Start: 03/19/24 2200   Disposition: Home likely in 1 to 2 days.  Status is: Inpatient Remains inpatient appropriate because: Significant electrolyte abnormalities, pending clinical improvement, intractable pain awaiting for IR intervention likely on 03/26/2024    Code Status:     Code Status: Full Code  Family Communication: None at bedside  Consultants: Interventional radiology Oncology  Procedures: None  Anti-infectives:  None  Anti-infectives (From admission, onward)    None       Subjective: Today, patient was seen and examined  at bedside.  Complains of abdominal discomfort nausea.  Denies any shortness of breath  dyspnea or chest pain.  Objective: Vitals:   03/24/24 2037 03/25/24 0620  BP: 100/67 104/71  Pulse: 77 79  Resp: 14 14  Temp: 98.3 F (36.8 C) 98.2 F (36.8 C)  SpO2: 98% 97%    Intake/Output Summary (Last 24 hours) at 03/25/2024 1107 Last data filed at 03/25/2024 0624 Gross per 24 hour  Intake 666.63 ml  Output 50 ml  Net 616.63 ml   Filed Weights   03/21/24 0428  Weight: 71.3 kg   Body mass index is 21.91 kg/m.   Physical Exam: GENERAL: Patient is alert awake and oriented. Not in obvious distress. HENT: No scleral pallor or icterus. Pupils equally reactive to light. Oral mucosa is moist NECK: is supple, no gross swelling noted. CHEST: Clear to auscultation. No crackles or wheezes. CVS: S1 and S2 heard, no murmur. Regular rate and rhythm.  ABDOMEN: Soft, tenderness over the epigastric area., bowel sounds are present. EXTREMITIES: No edema. CNS: Cranial nerves are intact. No focal motor deficits. SKIN: warm and dry without rashes.  Data Review: I have personally reviewed the following laboratory data and studies,  CBC: Recent Labs  Lab 03/21/24 0154 03/22/24 0527 03/22/24 1750 03/23/24 0251 03/24/24 0030 03/25/24 0303  WBC 4.2 3.8*  --  4.2 4.1 4.4  NEUTROABS 3.3 1.9  --  2.4 2.4 2.5  HGB 7.2* 7.0* 8.7* 8.2* 8.4* 8.5*  HCT 22.6* 22.4* 26.6* 25.7* 26.7* 27.4*  MCV 81.0 80.9  --  81.1 81.4 82.0  PLT 129* 126*  --  124* 114* 115*   Basic Metabolic Panel: Recent Labs  Lab 03/21/24 0154 03/22/24 0527 03/22/24 1750 03/23/24 0251 03/24/24 0030 03/25/24 0303  NA 138 139 137 138 137 136  K 3.4* 3.5 3.7 3.4* 3.2* 3.6  CL 107 107 105 105 104 105  CO2 19* 21* 22 21* 23 20*  GLUCOSE 94 82 104* 84 102* 92  BUN <5* <5* <5* <5* <5* <5*  CREATININE 0.33* 0.31* 0.30* 0.37* 0.34* 0.35*  CALCIUM  6.9* 5.8* 5.2* 5.2* 5.0* 4.8*  MG 1.8 1.6*  --  1.6* 1.8 1.7  PHOS 2.2* 2.3*  --  2.5 2.3* 2.4*   Liver Function Tests: Recent Labs  Lab 03/20/24 0255 03/21/24 0154  03/23/24 0251 03/24/24 0030 03/25/24 0303  AST 127* 103* 89* 57* 39  ALT 145* 143* 145* 108* 84*  ALKPHOS 1,440* 1,355* 1,395* 1,398* 1,316*  BILITOT 0.6 0.4 0.4 0.4 0.4  PROT 5.8* 5.4* 5.3* 5.4* 5.4*  ALBUMIN  3.2* 3.1* 3.1* 3.1* 3.1*   No results for input(s): LIPASE, AMYLASE in the last 168 hours. No results for input(s): AMMONIA in the last 168 hours. Cardiac Enzymes: No results for input(s): CKTOTAL, CKMB, CKMBINDEX, TROPONINI in the last 168 hours. BNP (last 3 results) No results for input(s): BNP in the last 8760 hours.  ProBNP (last 3 results) No results for input(s): PROBNP in the last 8760 hours.  CBG: No results for input(s): GLUCAP in the last 168 hours. No results found for this or any previous visit (from the past 240 hours).   Studies: No results found.    Thirza Pellicano, MD  [facility] 03/25/2024  If 7PM-7AM, please contact night-coverage

## 2024-03-26 ENCOUNTER — Encounter (HOSPITAL_COMMUNITY): Payer: Self-pay | Admitting: Internal Medicine

## 2024-03-26 ENCOUNTER — Ambulatory Visit (HOSPITAL_COMMUNITY)
Admission: RE | Admit: 2024-03-26 | Discharge: 2024-03-26 | Disposition: A | Discharge status: Home / Self Care | Payer: MEDICAID | Source: Ambulatory Visit | Attending: Radiology | Admitting: Radiology

## 2024-03-26 ENCOUNTER — Encounter: Payer: Self-pay | Admitting: General Practice

## 2024-03-26 ENCOUNTER — Ambulatory Visit (HOSPITAL_COMMUNITY): Payer: MEDICAID

## 2024-03-26 ENCOUNTER — Inpatient Hospital Stay (HOSPITAL_COMMUNITY): Payer: MEDICAID

## 2024-03-26 DIAGNOSIS — R112 Nausea with vomiting, unspecified: Secondary | ICD-10-CM | POA: Diagnosis not present

## 2024-03-26 DIAGNOSIS — R197 Diarrhea, unspecified: Secondary | ICD-10-CM | POA: Diagnosis not present

## 2024-03-26 LAB — CBC
HCT: 25 % — ABNORMAL LOW (ref 36.0–46.0)
Hemoglobin: 8.1 g/dL — ABNORMAL LOW (ref 12.0–15.0)
MCH: 26.3 pg (ref 26.0–34.0)
MCHC: 32.4 g/dL (ref 30.0–36.0)
MCV: 81.2 fL (ref 80.0–100.0)
Platelets: 110 K/uL — ABNORMAL LOW (ref 150–400)
RBC: 3.08 MIL/uL — ABNORMAL LOW (ref 3.87–5.11)
RDW: 17.7 % — ABNORMAL HIGH (ref 11.5–15.5)
WBC: 3.8 K/uL — ABNORMAL LOW (ref 4.0–10.5)
nRBC: 0.5 % — ABNORMAL HIGH (ref 0.0–0.2)

## 2024-03-26 LAB — COMPREHENSIVE METABOLIC PANEL WITH GFR
ALT: 60 U/L — ABNORMAL HIGH (ref 0–44)
AST: 28 U/L (ref 15–41)
Albumin: 3 g/dL — ABNORMAL LOW (ref 3.5–5.0)
Alkaline Phosphatase: 1165 U/L — ABNORMAL HIGH (ref 38–126)
Anion gap: 10 (ref 5–15)
BUN: <5 mg/dL — ABNORMAL LOW (ref 6–20)
CO2: 19 mmol/L — ABNORMAL LOW (ref 22–32)
Calcium: 6.4 mg/dL — CL (ref 8.9–10.3)
Chloride: 106 mmol/L (ref 98–111)
Creatinine, Ser: 0.33 mg/dL — ABNORMAL LOW (ref 0.44–1.00)
GFR, Estimated: >60 mL/min (ref >60)
Glucose, Bld: 87 mg/dL (ref 70–99)
Potassium: 3.8 mmol/L (ref 3.5–5.1)
Sodium: 135 mmol/L (ref 135–145)
Total Bilirubin: 0.4 mg/dL (ref 0.0–1.2)
Total Protein: 5.2 g/dL — ABNORMAL LOW (ref 6.5–8.1)

## 2024-03-26 LAB — MAGNESIUM: Magnesium: 1.4 mg/dL — ABNORMAL LOW (ref 1.7–2.4)

## 2024-03-26 LAB — GLUCOSE, CAPILLARY: Glucose-Capillary: 79 mg/dL (ref 70–99)

## 2024-03-26 LAB — CALCIUM
Calcium: 5.9 mg/dL — CL (ref 8.9–10.3)
Calcium: 6.5 mg/dL — ABNORMAL LOW (ref 8.9–10.3)
Calcium: 7.4 mg/dL — ABNORMAL LOW (ref 8.9–10.3)

## 2024-03-26 LAB — PHOSPHORUS: Phosphorus: 2.3 mg/dL — ABNORMAL LOW (ref 2.5–4.6)

## 2024-03-26 MED ORDER — FENTANYL CITRATE (PF) 100 MCG/2ML IJ SOLN
INTRAMUSCULAR | Status: AC
  Filled 2024-03-26: qty 2

## 2024-03-26 MED ORDER — DEXTROSE 5 % IV SOLN
30.0000 mmol | Freq: Once | INTRAVENOUS | Status: AC
  Administered 2024-03-26: 30 mmol via INTRAVENOUS
  Filled 2024-03-26: qty 10

## 2024-03-26 MED ORDER — ALCOHOL (ABLYSINOL) 99% IA SOLN
INTRA_ARTERIAL | Status: AC
  Filled 2024-03-26: qty 30

## 2024-03-26 MED ORDER — MIDAZOLAM HCL 2 MG/2ML IJ SOLN
INTRAMUSCULAR | Status: AC | PRN
  Administered 2024-03-26: 1 mg via INTRAVENOUS
  Administered 2024-03-26: .5 mg via INTRAVENOUS

## 2024-03-26 MED ORDER — MAGNESIUM SULFATE 4 GM/100ML IV SOLN
4.0000 g | Freq: Once | INTRAVENOUS | Status: AC
  Administered 2024-03-26: 4 g via INTRAVENOUS
  Filled 2024-03-26: qty 100

## 2024-03-26 MED ORDER — HYDROCODONE-ACETAMINOPHEN 5-325 MG PO TABS: 1.0000 | ORAL_TABLET | ORAL | Status: DC | PRN

## 2024-03-26 MED ORDER — CALCIUM CARBONATE ANTACID 1250 MG/5ML PO SUSP
1000.0000 mg | Freq: Two times a day (BID) | ORAL | Status: DC
  Filled 2024-03-26 (×5): qty 10

## 2024-03-26 MED ORDER — MIDAZOLAM HCL 2 MG/2ML IJ SOLN
INTRAMUSCULAR | Status: AC
  Filled 2024-03-26: qty 2

## 2024-03-26 MED ORDER — IOHEXOL 300 MG/ML  SOLN
25.0000 mL | Freq: Once | INTRAMUSCULAR | Status: AC | PRN
  Administered 2024-03-26: 2 mL

## 2024-03-26 MED ORDER — FENTANYL CITRATE (PF) 100 MCG/2ML IJ SOLN
INTRAMUSCULAR | Status: AC | PRN
  Administered 2024-03-26 (×2): 25 ug via INTRAVENOUS

## 2024-03-26 MED ORDER — ALCOHOL (ABLYSINOL) 99% IA SOLN
INTRA_ARTERIAL | Status: AC | PRN
  Administered 2024-03-26: 30 mL

## 2024-03-26 MED ORDER — ALTEPLASE 2 MG IJ SOLR
2.0000 mg | Freq: Once | INTRAMUSCULAR | Status: DC
Start: 2024-03-26 — End: 2024-03-28
  Filled 2024-03-26: qty 2

## 2024-03-26 NOTE — Consult Note (Signed)
 WOC Nurse Consult Note: Reason for Consult: LLQ drainage Wound type: LLQ open wound with bilious drainage Pressure Injury POA:NA Measurement: 1 cm x 1 cm Wound bed: unable to assess, currently has an ostomy pouch over site to catch drainage, pouch is clean and intact Drainage (amount, consistency, odor) moderate amount green drainage Periwound: unable top assess, currently ostomy pouch in place to catch drainage Dressing procedure/placement/frequency:   Will maintain current ostomy pouch in place.  Patient report numerous leaking episodes during the weekend.  Discussed with bedside RN, in the event of further leaking would recommend a change to small Eakin pouch (lawson # [phone]) with barrier ring (lawson # [phone]).  If skin is denuded recommend the following procedure:  Sprinkle ostomy powder over the irritated skin, tap over powder with damp wash cloth or skin barrier wipe (Cavilon No-Sting), brushing away excess powder to form a layer of crust. Ok to apply 3 layers as described above.  Place barrier ring around open area (like a stoma) Place new Eakin pouch (cut opening in pouch to fit around skin opening) Write date on pouch   WOC team will not follow at this time, please re- consult if new needs arise.

## 2024-03-26 NOTE — Procedures (Signed)
  Procedure:  CT celiac plexus block and neurolysis 30ml EtOH Preprocedure diagnosis: Chronic severe visceral pain Postprocedure diagnosis: same EBL:    minimal Complications:   none immediate  See full dictation in [employer].  CHARM Toribio Faes MD Main # [phone] Pager  [phone] Mobile [phone]

## 2024-03-26 NOTE — Progress Notes (Signed)
 IVT requested to bedside by RN.  Pt has been receiving calcium  gluconate, potassium and magnesium  through PAC.  Upon arrival to bedside, port unable to be flushed and when attempting to flush pressure noted to be building up under skin. Pt reports similar incident yesterday that required port to be deaccessed and reaccessed. No unusual events today, IVT drew labs around 1300 & 1700 without issue and fluids noted to be infusing until 1820.  This RN deaccessed PAC and reaccessed with no change, port remains unable to flush, pressure noted when attempting to flush.  Port deaccessed to reduce risk of infection.  PIV started and prior to leaving bedside, primary RN requesting a 2nd access due to requiring above infusions. Discussed with day and night primary RN's incompatible meds should not be ran simultaneously through a PAC as pt only has a single power port.   Discussed with night IVT RN. Plan will be to attempt to reaccess port then TPA line with goal of reestablishing function.

## 2024-03-26 NOTE — Progress Notes (Signed)
 MEDICATION-RELATED CONSULT NOTE   IR Procedure Consult - Anticoagulant/Antiplatelet PTA/Inpatient Med List Review by Pharmacist    Procedure: CT celiac plexus block & neurolysis    Completed: 10/6 @ 13:13  Post-Procedural bleeding risk per IR MD assessment: HIGH  Antithrombotic medications on inpatient profile prior to procedure:   Enoxaparin  40 mg SQ daily (last dose 10/4 PM); next dose ordered for 22:00 tonight PTA antithrombotic medications:  none    Recommended restart time per IR Post-Procedure Guidelines:  POD1 AM   Other considerations:   [place] guidelines for management of anticoagulation for neuraxial and peripheral nerve procedures recommend withholding prophylactic enoxaparin  at least 12 hr post-procedurally (will be met by IR post-procedure guidelines above)   Plan:     Reschedule Lovenox  for tomorrow AM (10:00), which is >12 hr post-procedure  Bard Jeans, PharmD, BCPS [phone] 03/26/2024, 1:23 PM

## 2024-03-26 NOTE — Progress Notes (Signed)
 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  Patient was admitted for electrolyte abnormalities in the setting of chemotherapy treatment.    Assessment/Plan: Principal Problem:   Nausea vomiting and diarrhea Active Problems:   Hypokalemia   Hypocalcemia   Carcinomatosis peritonei (HCC)   Malignant neoplasm of upper-outer quadrant of right breast in female, estrogen receptor positive (HCC)   QT prolongation   Elevated LFTs   Pancreatic mass   Hypomagnesemia   Neonatal cows' milk hypocalcemia  Hypocalcemia / Currently on calcium  infusion.  Level has slightly improved.  Will continue to monitor closely.   Hypomagnesemia  Magnesium  1.5.  Will replenish with magnesium  sulfate.    Hypokalemia: Improved constipation.  Latest potassium of 3.8.   Hypophosphatemia: Replenished.  Phosphorus of 2.3 today.  Receiving potassium phosphate .   Nausea,  vomiting and diarrhea: No further episodes.  Pending GI pathogen panel and C. difficile.   Prolonged QT: Secondary to multiple electrolyte abnormalities.  Continue to monitor.   Metastatic breast cancer with peritoneal carcinomatosis and possible pancreatic mets with pancreatic head lesion with duodenal obstruction and status post G-tube / biliary obstruction due to malignancy: Right upper quadrant ultrasound showed common bile duct dilatation at 13 mm which compares to reported 12 mm  diameter on previous MRI from 03/07/2024.  Subtle heterogenicity of hepatic echotexture.   Patient was noted to have been started on chemotherapy with Enhertu  to reduce tumor burden, alleviate abdominal symptoms and improve bowel flow.  LFTs slightly trending down.  Will get wound care consultation.   Metastatic breast cancer/chronic cancer-related pain; - Under care of Dr. Valere, receiving Enhertu .  Continue pain control.  IR has been consulted for possible celiac plexus neurolysis since she had history of celiac plexus nerve block on 01/16/2024 by IR.   Anemia secondary to malignancy and  oral chemotherapy: Initial hemoglobin of 7.0.  Hemoglobin 8.1 today after 1 unit of packed RBC.   Pressure injury left and right buttocks stage II.  Present on admission.  Continue wound care Wound 02/01/24 1630 Pressure Injury Buttocks Left Stage 2 -  Partial thickness loss of dermis presenting as a shallow open injury with a red, pink wound bed without slough. (Active)     Wound 02/01/24 1630 Pressure Injury Buttocks Right Stage 2 -  Partial thickness loss of dermis presenting as a shallow open injury with a red, pink wound bed without slough. (Active)   DVT prophylaxis: enoxaparin  (LOVENOX ) injection 40 mg Start: 03/19/24 2200   Disposition: Home likely in 1 to 2 days.  Status is: Inpatient Remains inpatient appropriate because: Significant electrolyte abnormalities, pending clinical improvement,  awaiting for IR intervention likely on 03/26/2024    Code Status:     Code Status: Full Code  Family Communication: None at bedside  Consultants: Interventional radiology Medical oncology  Procedures: None  Anti-infectives:  None  Anti-infectives (From admission, onward)    None       Subjective: Today, patient was seen and examined at bedside.  Patient denies any nausea  vomiting and has mild abdominal pain.  Denies any shortness of breath dyspnea.  Patient's family at  bedside,  Objective: Vitals:   03/25/24 2032 03/26/24 0407  BP: 109/73 111/65  Pulse: 80 75  Resp: 16 16  Temp: 98.7 F (37.1 C) 98.4 F (36.9 C)  SpO2: 100% 99%    Intake/Output Summary (Last 24 hours) at 03/26/2024 1021 Last data filed at 03/26/2024 0336 Gross per 24 hour  Intake 1326.67 ml  Output 100 ml  Net 1226.67 ml   Filed Weights   03/21/24 0428  Weight: 71.3 kg   Body mass index is 21.91 kg/m.   Physical Exam:  GENERAL: Patient is alert awake and oriented. Not in obvious distress. HENT: No scleral pallor or icterus. Pupils equally reactive to light. Oral mucosa is moist NECK: is supple, no gross swelling noted. CHEST: Clear to auscultation. No crackles or wheezes. CVS: S1 and S2 heard, no murmur. Regular rate and rhythm.  ABDOMEN: Soft, tenderness over the epigastric area, dressing noted over the abdomen., bowel sounds are present. EXTREMITIES: No edema. CNS: Cranial nerves are intact. No focal motor deficits. SKIN: warm and dry without rashes.  Data Review: I have personally reviewed the following laboratory data and studies,  CBC: Recent Labs  Lab 03/21/24 0154 03/22/24 0527 03/22/24 1750 03/23/24 0251 03/24/24 0030 03/25/24 0303 03/26/24 0406  WBC 4.2 3.8*  --  4.2 4.1 4.4 3.8*  NEUTROABS 3.3 1.9  --  2.4 2.4 2.5  --   HGB 7.2* 7.0* 8.7* 8.2* 8.4* 8.5* 8.1*  HCT 22.6* 22.4* 26.6* 25.7* 26.7* 27.4* 25.0*  MCV 81.0 80.9  --  81.1 81.4 82.0 81.2  PLT 129* 126*  --  124* 114* 115* 110*   Basic Metabolic Panel: Recent Labs  Lab 03/22/24 0527 03/22/24 1750 03/23/24 0251 03/24/24 0030 03/25/24 0303 03/25/24 1724 03/25/24 2230 03/26/24 0406  NA 139 137 138 137 136  --   --  135  K 3.5 3.7 3.4* 3.2* 3.6  --   --  3.8  CL 107 105 105 104 105  --   --  106  CO2 21* 22 21* 23 20*  --   --  19*  GLUCOSE 82 104* 84 102* 92  --   --  87  BUN <5* <5* <5* <5* <5*  --   --  <5*  CREATININE 0.31* 0.30* 0.37* 0.34* 0.35*  --   --  0.33*  CALCIUM   5.8* 5.2* 5.2* 5.0* 4.8* 5.6* 5.9* 6.4*  MG 1.6*  --  1.6* 1.8 1.7  --   --  1.4*  PHOS 2.3*  --  2.5 2.3* 2.4*  --   --  2.3*   Liver Function Tests: Recent Labs  Lab 03/21/24 0154 03/23/24 0251 03/24/24 0030 03/25/24 0303 03/26/24 0406  AST 103* 89* 57* 39 28  ALT 143* 145* 108* 84* 60*  ALKPHOS 1,355* 1,395* 1,398* 1,316* 1,165*  BILITOT 0.4 0.4 0.4 0.4 0.4  PROT 5.4* 5.3* 5.4* 5.4* 5.2*  ALBUMIN  3.1* 3.1* 3.1* 3.1* 3.0*   No results for input(s): LIPASE, AMYLASE in the last 168 hours. No results for input(s): AMMONIA in the last 168 hours. Cardiac Enzymes: No results for input(s): CKTOTAL, CKMB, CKMBINDEX, TROPONINI in the last 168 hours. BNP (last 3 results) No results for input(s): BNP in the last 8760 hours.  ProBNP (last 3 results) No results for input(s): PROBNP in the last 8760 hours.  CBG: Recent Labs  Lab 03/26/24 0620  GLUCAP  79   No results found for this or any previous visit (from the past 240 hours).   Studies: No results found.    Min Tunnell, MD  [facility] 03/26/2024  If 7PM-7AM, please contact night-coverage

## 2024-03-26 NOTE — Progress Notes (Signed)
 [facility] Spiritual Care Note  [name] requests that [facility] chaplain continue to follow inpatient with calls or visits. Left voicemail of care and encouragement. Following for spiritual and emotional support.  Additionally, [facility] 24/7 inpatient chaplain pager is [phone].  [address] Chelsea Hansen, [place], [facility] Pager [phone] Voicemail [phone]

## 2024-03-27 LAB — COMPREHENSIVE METABOLIC PANEL WITH GFR
ALT: 54 U/L — ABNORMAL HIGH (ref 0–44)
AST: 37 U/L (ref 15–41)
Albumin: 3 g/dL — ABNORMAL LOW (ref 3.5–5.0)
Alkaline Phosphatase: 1049 U/L — ABNORMAL HIGH (ref 38–126)
Anion gap: 9 (ref 5–15)
BUN: <5 mg/dL — ABNORMAL LOW (ref 6–20)
CO2: 19 mmol/L — ABNORMAL LOW (ref 22–32)
Calcium: 6.8 mg/dL — ABNORMAL LOW (ref 8.9–10.3)
Chloride: 107 mmol/L (ref 98–111)
Creatinine, Ser: 0.3 mg/dL — ABNORMAL LOW (ref 0.44–1.00)
GFR, Estimated: >60 mL/min (ref >60)
Glucose, Bld: 100 mg/dL — ABNORMAL HIGH (ref 70–99)
Potassium: 4.1 mmol/L (ref 3.5–5.1)
Sodium: 134 mmol/L — ABNORMAL LOW (ref 135–145)
Total Bilirubin: 0.4 mg/dL (ref 0.0–1.2)
Total Protein: 5 g/dL — ABNORMAL LOW (ref 6.5–8.1)

## 2024-03-27 LAB — CBC
HCT: 25.7 % — ABNORMAL LOW (ref 36.0–46.0)
Hemoglobin: 8.4 g/dL — ABNORMAL LOW (ref 12.0–15.0)
MCH: 26.9 pg (ref 26.0–34.0)
MCHC: 32.7 g/dL (ref 30.0–36.0)
MCV: 82.4 fL (ref 80.0–100.0)
Platelets: 114 K/uL — ABNORMAL LOW (ref 150–400)
RBC: 3.12 MIL/uL — ABNORMAL LOW (ref 3.87–5.11)
RDW: 17.8 % — ABNORMAL HIGH (ref 11.5–15.5)
WBC: 4.1 K/uL (ref 4.0–10.5)
nRBC: 0 % (ref 0.0–0.2)

## 2024-03-27 LAB — CALCIUM
Calcium: 7.4 mg/dL — ABNORMAL LOW (ref 8.9–10.3)
Calcium: 7.7 mg/dL — ABNORMAL LOW (ref 8.9–10.3)

## 2024-03-27 LAB — MAGNESIUM: Magnesium: 1.9 mg/dL (ref 1.7–2.4)

## 2024-03-27 LAB — PHOSPHORUS: Phosphorus: 2.8 mg/dL (ref 2.5–4.6)

## 2024-03-27 MED ORDER — LEVALBUTEROL HCL 0.63 MG/3ML IN NEBU
0.6300 mg | INHALATION_SOLUTION | Freq: Four times a day (QID) | RESPIRATORY_TRACT | Status: DC | PRN
Start: 2024-03-27 — End: 2024-03-28

## 2024-03-27 NOTE — Progress Notes (Signed)
 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  Patient was admitted for electrolyte abnormalities in the setting of chemotherapy treatment.    Assessment/Plan: Principal Problem:   Nausea vomiting and diarrhea Active Problems:   Hypokalemia   Hypocalcemia   Carcinomatosis peritonei (HCC)   Malignant neoplasm of upper-outer quadrant of right breast in female, estrogen receptor positive (HCC)   QT prolongation   Elevated LFTs   Pancreatic mass   Hypomagnesemia   Neonatal cows' milk hypocalcemia  Hypocalcemia  Currently on calcium  infusion.  Level has slightly improved.  Will continue to monitor closely.  Latest calcium  level of 6.8.  Will continue with calcium  infusion.  Patient will likely need infusion clinic follow-up for electrolyte and fluid replacement periodically.  It appears that Dr. Odean oncology was thinking about it.   Hypomagnesemia Received IV magnesium  sulfate yesterday with improvement of magnesium  to 1.9 today.   Hypokalemia: Receiving IV supplementation with potassium at 4.1 today.   Hypophosphatemia: Received potassium phosphate  with phosphorus of 2.8 today.  Check levels in AM.   Nausea,  vomiting and diarrhea: No further episodes.  Had bowel movements.  No further diarrhea.  Prolonged QT: Secondary to multiple electrolyte abnormalities.  Continue to monitor.  Repeat EKG with improved QTc.   Metastatic  breast cancer with peritoneal carcinomatosis and possible pancreatic mets with pancreatic head lesion with duodenal obstruction and status post G-tube / biliary obstruction due to malignancy: Right upper quadrant ultrasound showed common bile duct dilatation at 13 mm which compares to reported 12 mm diameter on previous MRI from 03/07/2024.  Subtle heterogenicity of hepatic echotexture.   Patient was noted to have been started on chemotherapy with Enhertu  to reduce tumor burden, alleviate abdominal symptoms and improve bowel flow.  LFTs slightly trending down.  Wound care was consulted for ostomy powder management.   Metastatic breast cancer/chronic cancer-related pain; - Under care of Dr. Godina, receiving Enhertu .  Continue pain control.  IR was consulted for possible celiac plexus neurolysis and underwent neurolysis with alcohol on 03/26/2024.  Still has some discomfort.  Since she had history of celiac plexus nerve block on 01/16/2024 by IR.   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   Pressure injury left and right buttocks stage II.  Present on admission.  Continue wound care Wound 02/01/24 1630 Pressure Injury Buttocks Left Stage 2 -  Partial thickness loss of dermis presenting as a shallow open injury with a red, pink wound bed without slough. (Active)     Wound 02/01/24 1630 Pressure Injury Buttocks Right Stage 2 -  Partial thickness loss of dermis presenting as a shallow open injury with a red, pink wound bed without slough. (Active)   DVT prophylaxis: enoxaparin  (LOVENOX ) injection 40 mg Start: 03/19/24 2200   Disposition: Home likely on 03/28/2024 if calcium  continues to improve.  Patient will need increased mobility while in the hospital.  Status is: Inpatient Remains  inpatient appropriate because: Significant electrolyte abnormalities,     Code Status:     Code  Status: Full Code  Family Communication: None at bedside  Consultants: Interventional radiology Medical oncology  Procedures: Celiac plexus block and neurolysis on 03/26/2024 under CT guidance by IR.  Anti-infectives:  None  Anti-infectives (From admission, onward)    None       Subjective: Today, patient was seen and examined at bedside.  Patient states that she feels okay denies any nausea vomiting has had a bowel movement.  Has been tolerating oral diet.  Underwent neurolysis yesterday still with some abdominal discomfort.  Objective: Vitals:   03/26/24 2121 03/27/24 0336  BP: 107/70 101/69  Pulse: 100 79  Resp: 19 18  Temp: 98.6 F (37 C) 98.5 F (36.9 C)  SpO2: 99% 100%    Intake/Output Summary (Last 24 hours) at 03/27/2024 0940 Last data filed at 03/27/2024 0201 Gross per 24 hour  Intake 2398.12 ml  Output --  Net 2398.12 ml   Filed Weights   03/21/24 0428  Weight: 71.3 kg   Body mass index is 21.91 kg/m.   Physical Exam:  GENERAL: Patient is alert awake and oriented. Not in obvious distress. HENT: No scleral pallor or icterus. Pupils equally reactive to light. Oral mucosa is moist NECK: is supple, no gross swelling noted. CHEST: Clear to auscultation.  Diminished breath sounds bilaterally.  Right chest wall port in place. CVS: S1 and S2 heard, no murmur. Regular rate and rhythm.  ABDOMEN: Soft,, dressing noted over the abdomen, pouch noted., bowel sounds are present. EXTREMITIES: No edema. CNS: Cranial nerves are intact. No focal motor deficits. SKIN: warm and dry without rashes.  Pressure ulcerations were present  Data Review: I have personally reviewed the following laboratory data and studies,  CBC: Recent Labs  Lab 03/21/24 0154 03/22/24 0527 03/22/24 1750 03/23/24 0251 03/24/24 0030 03/25/24 0303 03/26/24 0406 03/27/24 0413  WBC 4.2 3.8*  --  4.2 4.1 4.4 3.8* 4.1  NEUTROABS 3.3 1.9  --  2.4 2.4 2.5  --   --   HGB 7.2* 7.0*   < >  8.2* 8.4* 8.5* 8.1* 8.4*  HCT 22.6* 22.4*   < > 25.7* 26.7* 27.4* 25.0* 25.7*  MCV 81.0 80.9  --  81.1 81.4 82.0 81.2 82.4  PLT 129* 126*  --  124* 114* 115* 110* 114*   < > = values in this interval not displayed.   Basic Metabolic Panel: Recent Labs  Lab 03/23/24 0251 03/24/24 0030 03/25/24 0303 03/25/24 1724 03/25/24 2230 03/26/24 0406 03/26/24 1315 03/26/24 1717 03/27/24 0413  NA 138 137 136  --   --  135  --   --  134*  K 3.4* 3.2* 3.6  --   --  3.8  --   --  4.1  CL 105 104 105  --   --  106  --   --  107  CO2 21* 23 20*  --   --  19*  --   --  19*  GLUCOSE 84 102* 92  --   --  87  --   --  100*  BUN <5* <5* <5*  --   --  <5*  --   --  <5*  CREATININE 0.37* 0.34* 0.35*  --   --  0.33*  --   --  0.30*  CALCIUM  5.2* 5.0* 4.8*   < > 5.9* 6.4* 6.5* 7.4* 6.8*  MG 1.6* 1.8 1.7  --   --  1.4*  --   --  1.9  PHOS 2.5 2.3* 2.4*  --   --  2.3*  --   --  2.8   < > = values in this interval not displayed.   Liver Function Tests: Recent Labs  Lab 03/23/24 0251 03/24/24 0030 03/25/24 0303 03/26/24 0406 03/27/24 0413  AST 89* 57* 39 28 37  ALT 145* 108* 84* 60* 54*  ALKPHOS 1,395* 1,398* 1,316* 1,165* 1,049*  BILITOT 0.4 0.4 0.4 0.4 0.4  PROT 5.3* 5.4* 5.4* 5.2* 5.0*  ALBUMIN  3.1* 3.1* 3.1* 3.0* 3.0*   No results for input(s): LIPASE, AMYLASE in the last 168 hours. No results for input(s): AMMONIA in the last 168 hours. Cardiac Enzymes: No results for input(s): CKTOTAL, CKMB, CKMBINDEX, TROPONINI in the last 168 hours. BNP (last 3 results) No results for input(s): BNP in the last 8760 hours.  ProBNP (last 3 results) No results for input(s): PROBNP in the last 8760 hours.  CBG: Recent Labs  Lab 03/26/24 0620  GLUCAP 79   No results found for this or any previous visit (from the past 240 hours).   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 She had a good response to celiac plexus block on 01/16/2024 without complication. She now presents for repeat celiac block with additional neurolysis. EXAM: CT GUIDED NEUROLYTIC ABLATION OF THE CELIAC AXIS ANESTHESIA/SEDATION: Intravenous Fentanyl  50mcg and Versed  1.5mg  were administered by RN during a total moderate (conscious) sedation time of 29 minutes; the patient's level of consciousness and physiological / cardiorespiratory status were monitored continuously by radiology RN under my direct supervision. PROCEDURE: The procedure risks, benefits, and alternatives were explained to the patient. Questions regarding the procedure were encouraged and answered. The patient understands and consents to the procedure. The anterior abdominal wall was prepped with chlorhexidine  in a sterile fashion, and a sterile drape was applied covering the operative field. A sterile gown and sterile gloves were used for the procedure. Local anesthesia was provided with 1% Lidocaine . A 22 gauge Chiba needle was advanced under CT guidance to the level of the celiac plexus. After confirming needle tip position, approximately three ml of a 1:10 dilution of Omnipaque -300 contrast and lidocaine  1% was injected. Spread of diluted contrast material was confirmed by CT, predominately on the right. Subsequently, a second parallel 22 gauge Chiba needle was advanced under CT guidance to the level of the celiac plexus to the left of midline. Dilute contrast:Lidocaine  8 mL was administered demonstrating good spread. Alcohol ablation of the celiac plexus was then performed with injection of 30 ml of absolute alcohol, divided between the 2 sites. CONTRAST:  2 mL Omnipaque  300 retroperitoneal COMPLICATIONS: None FINDINGS: The initial contrast  injection showed excellent spread at the level of the celiac plexus, predominately on the right. After second needle placement, follow-up injection demonstrates excellent spread on the left. Alcohol ablation was successfully performed. No immediate complication. IMPRESSION: CT guided neurolytic ablation of the celiac plexus performed with 30 ml of absolute alcohol. Electronically Signed   By: JONETTA Faes M.D.   On: 03/26/2024 15:08      Vernal Alstrom, MD  [facility] 03/27/2024  If 7PM-7AM, please contact night-coverage

## 2024-03-27 NOTE — Progress Notes (Signed)
 Patient ID: Chelsea Hansen, female   DOB: Sep 19, 1970, 53 y.o.   MRN: [id number] Pt s/p celiac plexus neurolysis yesterday; reports decreased abd pain; has some intermittent nausea; afebrile, BP ok; HGB stable; puncture site ant abd clean and dry; further plans as per [facility]/oncology

## 2024-03-28 ENCOUNTER — Inpatient Hospital Stay: Payer: MEDICAID | Admitting: Adult Health

## 2024-03-28 ENCOUNTER — Inpatient Hospital Stay: Payer: MEDICAID

## 2024-03-28 ENCOUNTER — Other Ambulatory Visit (HOSPITAL_COMMUNITY): Payer: Self-pay

## 2024-03-28 ENCOUNTER — Encounter: Payer: Self-pay | Admitting: Hematology and Oncology

## 2024-03-28 LAB — COMPREHENSIVE METABOLIC PANEL WITH GFR
ALT: 37 U/L (ref 0–44)
AST: 23 U/L (ref 15–41)
Albumin: 2.7 g/dL — ABNORMAL LOW (ref 3.5–5.0)
Alkaline Phosphatase: 912 U/L — ABNORMAL HIGH (ref 38–126)
Anion gap: 8 (ref 5–15)
BUN: <5 mg/dL — ABNORMAL LOW (ref 6–20)
CO2: 19 mmol/L — ABNORMAL LOW (ref 22–32)
Calcium: 7.9 mg/dL — ABNORMAL LOW (ref 8.9–10.3)
Chloride: 108 mmol/L (ref 98–111)
Creatinine, Ser: 0.41 mg/dL — ABNORMAL LOW (ref 0.44–1.00)
GFR, Estimated: >60 mL/min (ref >60)
Glucose, Bld: 83 mg/dL (ref 70–99)
Potassium: 4 mmol/L (ref 3.5–5.1)
Sodium: 136 mmol/L (ref 135–145)
Total Bilirubin: 0.3 mg/dL (ref 0.0–1.2)
Total Protein: 4.9 g/dL — ABNORMAL LOW (ref 6.5–8.1)

## 2024-03-28 LAB — CBC
HCT: 23.2 % — ABNORMAL LOW (ref 36.0–46.0)
Hemoglobin: 7.3 g/dL — ABNORMAL LOW (ref 12.0–15.0)
MCH: 26.4 pg (ref 26.0–34.0)
MCHC: 31.5 g/dL (ref 30.0–36.0)
MCV: 83.8 fL (ref 80.0–100.0)
Platelets: 112 K/uL — ABNORMAL LOW (ref 150–400)
RBC: 2.77 MIL/uL — ABNORMAL LOW (ref 3.87–5.11)
RDW: 17.6 % — ABNORMAL HIGH (ref 11.5–15.5)
WBC: 3.1 K/uL — ABNORMAL LOW (ref 4.0–10.5)
nRBC: 0.6 % — ABNORMAL HIGH (ref 0.0–0.2)

## 2024-03-28 LAB — PREPARE RBC (CROSSMATCH)

## 2024-03-28 LAB — PHOSPHORUS: Phosphorus: 2 mg/dL — ABNORMAL LOW (ref 2.5–4.6)

## 2024-03-28 LAB — CALCIUM
Calcium: 7.8 mg/dL — ABNORMAL LOW (ref 8.9–10.3)
Calcium: 8.1 mg/dL — ABNORMAL LOW (ref 8.9–10.3)

## 2024-03-28 LAB — MAGNESIUM: Magnesium: 1.8 mg/dL (ref 1.7–2.4)

## 2024-03-28 MED ORDER — CALCIUM CARBONATE 1250 (500 CA) MG PO TABS
1.0000 | ORAL_TABLET | Freq: Two times a day (BID) | ORAL | 0 refills | Status: DC
  Filled 2024-03-28: qty 180, 90d supply, fill #0

## 2024-03-28 MED ORDER — HEPARIN SOD (PORK) LOCK FLUSH 100 UNIT/ML IV SOLN
500.0000 [IU] | INTRAVENOUS | Status: AC | PRN
  Administered 2024-03-28: 500 [IU]
  Filled 2024-03-28: qty 5

## 2024-03-28 MED ORDER — MAGNESIUM SULFATE 2 GM/50ML IV SOLN
2.0000 g | Freq: Once | INTRAVENOUS | Status: AC
  Administered 2024-03-28: 2 g via INTRAVENOUS
  Filled 2024-03-28: qty 50

## 2024-03-28 MED ORDER — MAGNESIUM OXIDE -MG SUPPLEMENT 400 (240 MG) MG PO TABS
400.0000 mg | ORAL_TABLET | Freq: Two times a day (BID) | ORAL | 0 refills | Status: DC
  Filled 2024-03-28: qty 180, 90d supply, fill #0

## 2024-03-28 MED ORDER — SODIUM CHLORIDE 0.9% IV SOLUTION: Freq: Once | INTRAVENOUS | Status: AC

## 2024-03-28 MED ORDER — SODIUM PHOSPHATES 45 MMOLE/15ML IV SOLN
30.0000 mmol | Freq: Once | INTRAVENOUS | Status: AC
  Administered 2024-03-28: 30 mmol via INTRAVENOUS
  Filled 2024-03-28: qty 10

## 2024-03-28 NOTE — Plan of Care (Signed)
   Problem: Health Behavior/Discharge Planning: Goal: Ability to manage health-related needs will improve Outcome: Progressing   Problem: Clinical Measurements: Goal: Ability to maintain clinical measurements within normal limits will improve Outcome: Progressing Goal: Will remain free from infection Outcome: Progressing Goal: Diagnostic test results will improve Outcome: Progressing Goal: Respiratory complications will improve Outcome: Progressing

## 2024-03-28 NOTE — Discharge Summary (Signed)
 Physician Discharge Summary  Chelsea Hansen FMW:[id number] DOB: Mar 26, 1971 DOA: 03/19/2024  PCP: [name], NP  Admit date: 03/19/2024 Discharge date: 03/28/2024  Admitted From: Home Disposition: Home  Recommendations for Outpatient Follow-up:  Follow up with PCP in 1-2 weeks Follow-up with medical oncology, Dr. Valley Increase magnesium  oxide to 40 mg p.o. twice daily, started calcium  carbonate twice daily Please obtain BMP/CBC, magnesium  and phosphorus level in 1 week  Home Health: No Equipment/Devices: None  Discharge Condition: Stable CODE STATUS: Full code Diet recommendation: Regular diet  History of present illness:  Chelsea Hansen  is a 53 y.o. 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   Patient reports recent worsening in her nausea and vomiting despite taking antiemetics at home.  She also had diarrhea yesterday, but not today.  She was seen at the cancer center today for evaluation of this, and was found to have serum calcium  4.5, magnesium  1.0, potassium 3.3, and prolonged QT interval.  She was treated with 10 mill equivalents IV potassium, 4 g IV magnesium , Zofran , Decadron , and morphine  at the cancer center.  Direct admission was arranged for treatment of her severe hypocalcemia.   Hospital course:  Hypocalcemia Hypomagnesemia Hypokalemia Hypophosphatemia Patient presenting with nausea, vomiting and was noted to have severe electrolyte derangements with calcium  4.5, magnesium  1.0, potassium 3.3 at cancer center.  Patient was directly admitted and aggressively repleted with IV electrolytes during hospitalization.  Potassium improved to 4.0, magnesium  1.8, phosphorus 2.0, calcium  7.9 at  time of discharge.  Will continue magnesium , calcium  supplementation on discharge.  Repeat BMP, magnesium , phosphorus level 1 week.  Prolonged Qtc Likely complicated by multiple electrolyte abnormalities.  Repeat EKG with improved QTc.  Metastatic breast cancer with peritoneal carcinomatosis and possible pancreatic mets with pancreatic head lesion with duodenal obstruction and status post G-tube / biliary obstruction due to malignancy: Right upper quadrant ultrasound showed common bile duct dilatation at 13 mm which compares to reported 12 mm diameter on previous MRI from 03/07/2024.  Subtle heterogenicity of hepatic echotexture.   Patient was noted to have been started on chemotherapy with Enhertu  to reduce tumor burden, alleviate abdominal symptoms and improve bowel flow.  LFTs slightly trending down.  Wound care was consulted for ostomy management.  Continue outpatient follow-up with Dr. Odean   Chronic cancer-related pain; IR was consulted for possible celiac plexus neurolysis and underwent neurolysis with alcohol on 03/26/2024.   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 (Active)     Wound 02/01/24 1630 Pressure Injury Buttocks Right Stage 2 -  Partial thickness loss of dermis presenting as a shallow open injury with a red, pink wound bed without slough. (Active)     Wound 03/27/24 0300 Pressure Injury Sacrum Medial;Upper Stage 2 -  Partial thickness loss of dermis presenting as a shallow open injury with a red, pink wound bed without slough. (Active)  Cleanse sacral wound with saline, pat dry. Cover with single layer of xeroform gauze, top with foam dressing. Ok  to change  foam every 3 days, lift to change xeroform gauze daily.   Small Eakin pouch with barrier ring.  If skin is denuded recommend the following procedure: Sprinkle ostomy powder over the irritated skin, tap over powder with damp wash cloth or skin barrier wipe (Cavilon No-Sting), brushing away excess powder to form a layer of crust. Ok to apply 3 layers as described above.  Place barrier ring around open area (like a stoma) Place new Eakin pouch (cut opening in pouch to fit around skin opening) Write date on pouch   Discharge Diagnoses:  Principal Problem:   Nausea vomiting and diarrhea Active Problems:   Hypokalemia   Hypocalcemia   Carcinomatosis peritonei (HCC)   Malignant neoplasm of upper-outer quadrant of right breast in female, estrogen receptor positive (HCC)   QT prolongation   Elevated LFTs   Pancreatic mass   Hypomagnesemia   Neonatal cows' milk hypocalcemia    Discharge Instructions  Discharge Instructions     Call MD for:  difficulty breathing, headache or visual disturbances   Complete by: As directed    Call MD for:  extreme fatigue   Complete by: As directed    Call MD for:  persistant dizziness or light-headedness   Complete by: As directed    Call MD for:  persistant nausea and vomiting   Complete by: As directed    Call MD for:  severe uncontrolled pain   Complete by: As directed    Call MD for:  temperature >100.4   Complete by: As directed    Diet - low sodium heart healthy   Complete by: As directed    Discharge wound care:   Complete by: As directed    Cleanse sacral wound with saline, pat dry. Cover with single layer of xeroform gauze, top with foam. Ok to lift foam to change xeroform daily, ok to change foam every 3 days.  Maintain current ostomy pouch in place over abdominal wound,  if leaking recommend a change to small Eakin pouch with barrier ring.  If skin is denuded recommend the following procedure:   Sprinkle ostomy powder over the  irritated skin, tap over powder with damp wash cloth or skin barrier wipe (Cavilon No-Sting), brushing away excess powder to form a layer of crust. Ok to apply 3 layers as described above.  Place barrier ring around open area (like a stoma). Place new Eakin pouch (cut opening in pouch to fit around skin opening). Write date on pouch   Increase activity slowly   Complete by: As directed       Allergies as of 03/28/2024       Reactions   Enhertu  [fam-trastuzumab  Deruxtec-nxki] Other (See Comments)   Headache and chest pain. See progress note from 02/16/2024. Patient able to complete infusion.        Medication List     TAKE these medications    acetaminophen  325 MG tablet Commonly known as: TYLENOL  Take 2 tablets (650 mg total) by mouth every 6 (six) hours as needed for mild pain (pain score 1-3) or fever (or Fever >/= 101).   calcium  carbonate 1250 (500 Ca) MG tablet Commonly known as: OS-CAL - dosed in mg of elemental calcium  Take 1 tablet (1,250 mg total) by mouth 2 (two) times daily with a meal.   fentaNYL  25 MCG/HR Commonly known as: DURAGESIC  Place 1 patch onto the skin every 3 (three) days.   lidocaine -prilocaine  cream Commonly known as: EMLA  Apply to affected area once  What changed:  how much to take how to take this when to take this reasons to take this additional instructions   magnesium  oxide 400 (240 Mg) MG tablet Commonly known as: MAG-OX Take 1 tablet (400 mg total) by mouth 2 (two) times daily.   ondansetron  8 MG tablet Commonly known as: ZOFRAN  Take 1 tablet (8 mg total) by mouth every 8 (eight) hours as needed for nausea or vomiting. Start on the third day after chemotherapy.   oxyCODONE  15 MG immediate release tablet Commonly known as: ROXICODONE  Take 1 tablet (15 mg total) by mouth every 4 (four) hours as needed for pain.   prochlorperazine  10 MG tablet Commonly known as: COMPAZINE  Take 1 tablet (10 mg total) by mouth every 6 (six) hours as  needed for nausea or vomiting.               Discharge Care Instructions  (From admission, onward)           Start     Ordered   03/28/24 0000  Discharge wound care:       Comments: Cleanse sacral wound with saline, pat dry. Cover with single layer of xeroform gauze, top with foam. Ok to lift foam to change xeroform daily, ok to change foam every 3 days.  Maintain current ostomy pouch in place over abdominal wound,  if leaking recommend a change to small Eakin pouch with barrier ring.  If skin is denuded recommend the following procedure:   Sprinkle ostomy powder over the irritated skin, tap over powder with damp wash cloth or skin barrier wipe (Cavilon No-Sting), brushing away excess powder to form a layer of crust. Ok to apply 3 layers as described above.  Place barrier ring around open area (like a stoma). Place new Eakin pouch (cut opening in pouch to fit around skin opening). Write date on pouch   03/28/24 1539            Follow-up Information     [name], NP. Schedule an appointment as soon as possible for a visit in 1 week(s).   Specialty: Internal Medicine Contact information: [address] [place] [address] [phone]         [name], MD. Schedule an appointment as soon as possible for a visit in 1 week(s).   Specialty: Hematology and Oncology Contact information: [address] [place] [address] [phone]                Allergies  Allergen Reactions   Enhertu  [Fam-Trastuzumab  Deruxtec-Nxki] Other (See Comments)    Headache and chest pain. See progress note from 02/16/2024. Patient able to complete infusion.    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 She had a good response to celiac plexus block on 01/16/2024 without complication. She now presents for repeat celiac block with additional neurolysis. EXAM: CT GUIDED NEUROLYTIC ABLATION OF THE CELIAC AXIS ANESTHESIA/SEDATION: Intravenous Fentanyl  50mcg and Versed  1.5mg  were administered by RN during a total moderate (conscious) sedation time of 29 minutes; the patient's level of consciousness and physiological / cardiorespiratory status were monitored continuously by radiology RN under my direct supervision. PROCEDURE: The procedure risks, benefits, and alternatives were explained to the patient. Questions regarding  the procedure were encouraged and answered. The patient understands and consents to the procedure. The anterior abdominal wall was prepped with chlorhexidine  in a sterile fashion, and a sterile drape was applied covering the operative field. A sterile gown and sterile gloves were used for the procedure. Local anesthesia was provided with 1% Lidocaine . A 22 gauge Chiba needle was advanced under CT guidance to the level of the celiac plexus. After confirming needle tip position, approximately three ml of a 1:10 dilution of Omnipaque -300 contrast and lidocaine  1% was injected. Spread of diluted contrast material was confirmed by CT, predominately on the right. Subsequently, a second parallel 22 gauge Chiba needle was advanced under CT guidance to the level of the celiac plexus to the left of midline. Dilute contrast:Lidocaine  8 mL was administered demonstrating good spread. Alcohol ablation of the celiac plexus was then performed with injection of 30 ml of absolute alcohol, divided between the 2 sites. CONTRAST:  2 mL Omnipaque  300 retroperitoneal COMPLICATIONS: None FINDINGS: The initial contrast injection showed excellent spread at the level of the celiac plexus, predominately on the right. After  second needle placement, follow-up injection demonstrates excellent spread on the left. Alcohol ablation was successfully performed. No immediate complication. IMPRESSION: CT guided neurolytic ablation of the celiac plexus performed with 30 ml of absolute alcohol. Electronically Signed   By: JONETTA Faes M.D.   On: 03/26/2024 15:08   DG Abd 2 Views Result Date: 03/20/2024 CLINICAL DATA:  Nausea vomiting EXAM: ABDOMEN - 2 VIEW COMPARISON:  Ultrasound abdomen March 20, 2024 FINDINGS: Paucity of bowel gas. No bowel obstruction identified. Gastrostomy tube in place. There is no evidence of free air. No radio-opaque calculi or other significant radiographic abnormality is seen. Heterogeneous sclerotic changes throughout the axial and appendicular skeleton consistent with sclerotic metastasis (patient has history of breast cancer). IMPRESSION: No bowel obstruction. Sclerotic osseous metastasis. Electronically Signed   By: Megan  Zare M.D.   On: 03/20/2024 19:55   US  Abdomen Limited RUQ (LIVER/GB) Result Date: 03/20/2024 CLINICAL DATA:  Elevated LFTs. EXAM: ULTRASOUND ABDOMEN LIMITED RIGHT UPPER QUADRANT COMPARISON:  MRI abdomen 03/07/2024 FINDINGS: Gallbladder: No gallstones or wall thickening visualized. No sonographic Murphy sign noted by sonographer. Common bile duct: Diameter: 13 mm. This compares to a reported 12 mm diameter on the previous MRI. Liver: Subtle heterogeneity of hepatic echotexture. Portal vein is patent on color Doppler imaging with normal direction of blood flow towards the liver. Other: None. IMPRESSION: 1. Common bile duct dilatation at 13 mm. This compares to a reported 12 mm diameter on the previous MRI from 03/07/2024. 2. Subtle heterogeneity of hepatic echotexture. Electronically Signed   By: Camellia Candle M.D.   On: 03/20/2024 06:22   MR 3D Recon At Scanner Result Date: 03/07/2024 : CLINICAL DATA: Pancreatic mass, monitor biliary obstruction from pancreatic head lesion, history of  metastatic breast cancer EXAM: MRI ABDOMEN WITHOUT AND WITH CONTRAST (INCLUDING MRCP) TECHNIQUE: Multiplanar multisequence MR imaging of the abdomen was performed both before and after the administration of intravenous contrast. Heavily T2-weighted images of the biliary and pancreatic ducts were obtained, and three-dimensional MRCP images were rendered by post processing. CONTRAST: 7mL GADAVIST  GADOBUTROL  1 MMOL/ML IV SOLN COMPARISON: CT abdomen pelvis March 05, 2024, MR abdomen December 23, 2023 FINDINGS: Lower chest: No pleural effusion. Hepatobiliary/MRCP: Non cirrhotic morphology. Prominent central intrahepatic bile ducts. No focal liver lesion.No significant hepatic steatosis. Gallbladder is normal. Common bile duct is dilated measuring up to 12 mm proximally and tapers to 4 mm  at the pancreatic head. Pancreas: At the region of pancreatic uncinate process there is an ill-defined mildly enhancing mass inseparable from the proximal third portion of duodenum measuring 2.4 x 2 cm with upstream dilation of the pancreatic duct up to 4 mm. This lesion was previously measured 2.5 x 2.2 cm on December 23, 2023. The mass is abutting the superior mesenteric vein. SMA is not involved. Spleen: Within normal limits in size and appearance. Adrenals/Urinary Tract: Stable bilateral simple cortical cysts which does not require imaging follow-up. No evidence of hydronephrosis. Stomach/Bowel: Gastrostomy tube is in place. No bowel obstruction. Vascular/Lymphatic: No pathologically enlarged lymph nodes identified. No abdominal aortic aneurysm demonstrated. Other: Enhancing nodule measuring 2.6 x 1.5 cm along the left mesocolon consistent with peritoneal carcinomatosis (21/71), stable to prior. Catheter tract in left lower quadrant ventral abdominal wall. Small intramuscular abscess with enhancing wall is identified within the left anterior abdominal wall measuring 3.9 x 1.4 cm, previously measured 4.2 x 2.2 cm (remeasured in the same  manner). 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 Musculoskeletal: Heterogeneous vertebral spine consistent with known osseous metastasisStable compression deformity of L3 vertebral body. IMPRESSION: Ill-defined mass at the region of pancreatic uncinate process/third portion of duodenum with associated upstream dilation of the common bile duct and pancreatic duct, grossly stable to prior and likely neoplastic. Recommend tissue sampling if not yet performed. Peritoneal carcinomatosis as detailed above, stable to prior. Left anterior abdominal wall abscess, stable. Heterogeneous bone marrow of the vertebral spine and L3 compression deformity consistent with known osseous metastasis from breast primary, better assessed on prior CT. Electronically Signed By: Megan Zare M.D. On: 03/07/2024 12:25 Electronically Signed   By: Duwaine Severs M.D.   On: 03/07/2024 12:29   MR ABDOMEN MRCP W WO CONTAST Result Date: 03/07/2024 CLINICAL DATA:  Pancreatic mass, monitor biliary obstruction from pancreatic head lesion, history of metastatic breast cancer EXAM: MRI ABDOMEN WITHOUT AND WITH CONTRAST (INCLUDING MRCP) TECHNIQUE: Multiplanar multisequence MR imaging of the abdomen was performed both before and after the administration of intravenous contrast. Heavily T2-weighted images of the biliary and pancreatic ducts were obtained, and three-dimensional MRCP images were rendered by post processing. CONTRAST:  7mL GADAVIST  GADOBUTROL  1 MMOL/ML IV SOLN COMPARISON:  CT abdomen pelvis March 05, 2024, MR abdomen December 23, 2023 FINDINGS: Lower chest: No pleural effusion. Hepatobiliary/MRCP: Non cirrhotic morphology. Prominent central intrahepatic bile ducts. No focal liver lesion.No significant hepatic steatosis. Gallbladder is normal. Common bile duct is dilated measuring up to 12 mm proximally and tapers to 4 mm at the pancreatic head. Pancreas: At the region of  pancreatic uncinate process there is an ill-defined mildly enhancing mass inseparable from the proximal third portion of duodenum measuring 2.4 x 2 cm with upstream dilation of the pancreatic duct up to 4 mm. This lesion was previously measured 2.5 x 2.2 cm on December 23, 2023. The mass is abutting the superior mesenteric vein. SMA is not involved. Spleen:  Within normal limits in size and appearance. Adrenals/Urinary Tract: Stable bilateral simple cortical cysts which does not require imaging follow-up. No evidence of hydronephrosis. Stomach/Bowel: Gastrostomy tube is in place.  No bowel obstruction. Vascular/Lymphatic: No pathologically enlarged lymph nodes identified. No abdominal aortic aneurysm demonstrated. Other: Enhancing nodule measuring 2.6 x 1.5 cm along the left mesocolon consistent with peritoneal carcinomatosis (21/71), stable to prior. Catheter tract in left lower quadrant ventral abdominal wall. Small intramuscular abscess with enhancing wall is identified within the left anterior  abdominal wall measuring 3.9 x 1.4 cm, previously measured 4.2 x 2.2 cm (remeasured in the same manner). 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 Musculoskeletal: Heterogeneous vertebral spine consistent with known osseous metastasisStable compression deformity of L3 vertebral body. IMPRESSION: Ill-defined mass at the region of pancreatic uncinate process/third portion of duodenum with associated upstream dilation of the common bile duct and pancreatic duct, grossly stable to prior and likely neoplastic. Recommend tissue sampling if not yet performed. Peritoneal carcinomatosis as detailed above, stable to prior. Left anterior abdominal wall abscess, stable. Heterogeneous bone marrow of the vertebral spine and L3 compression deformity consistent with known osseous metastasis from breast primary, better assessed on prior CT. Electronically Signed   By: Megan   Zare M.D.   On: 03/07/2024 12:25   CT ABDOMEN PELVIS W CONTRAST Result Date: 03/05/2024 EXAM: CT ABDOMEN AND PELVIS WITH CONTRAST 03/05/2024 01:00:32 AM TECHNIQUE: CT of the abdomen and pelvis was performed with the administration of intravenous contrast. Multiplanar reformatted images are provided for review. Automated exposure control, iterative reconstruction, and/or weight-based adjustment of the mA/kV was utilized to reduce the radiation dose to as low as reasonably achievable. COMPARISON: None available. CLINICAL HISTORY: Re-evaluation of previously diagnosed abdominal abscess of 02/27/24. FINDINGS: LOWER CHEST: No acute abnormality. LIVER: Focal fatty hepatic infiltration adjacent to the falciform ligament. No enhancing intrahepatic mass. GALLBLADDER AND BILE DUCTS: The gallbladder is unremarkable. There is progressive dilation of the extrahepatic bile duct which measures now 15 mm in diameter and demonstrates a relatively abrupt caliber change within the head of the pancreas best seen on coronal image 64/8. Known pancreatic head mass is not clearly identified on this examination. No intrahepatic biliary ductal dilation. SPLEEN: No acute abnormality. PANCREAS: No acute abnormality. ADRENAL GLANDS: No acute abnormality. KIDNEYS, URETERS AND BLADDER: Simple cortical cysts are seen within the kidneys bilaterally. The kidneys are otherwise unremarkable. No follow up imaging is recommended for these lesions. No stones in the kidneys or ureters. No hydronephrosis. No perinephric or periureteral stranding. Urinary bladder is unremarkable. GI AND BOWEL: Partial gastrectomy, gastrojejunostomy, and balloon retention gastrostomy placement are again identified. Moderate pancolonic diverticulosis. The stomach, small bowel, and large bowel are otherwise unremarkable. Appendix normal. PERITONEUM AND RETROPERITONEUM: There is omental infiltration in keeping with peritoneal carcinomatosis, unchanged from prior examination  and best visualized within the periumbilical region. No ascites. No free air. VASCULATURE: Aorta is normal in caliber. LYMPH NODES: No lymphadenopathy. REPRODUCTIVE ORGANS: Uterus absent. No adnexal masses. BONES AND SOFT TISSUES: Diffuse mixed lytic and sclerotic patterns again seen within the axial skeleton in keeping with widespread osseous metastatic disease. Remote pathologic fracture of L3 with mild loss of height again noted. No retropulsion or listhesis. No acute bone abnormality. The previously identified complex left upper quadrant abdominal wall abscess has improved with a residual 1.4 x 1.8 cm fluid collection seen subjacent to the umbilical abdominal wall within the peritoneum and a pancreatic collection of gas and debris between the internal and external oblique musculature measuring 4 mm x 22 mm communicating with the previous catheter tract. There is persistent abdominal wall thickening and interfascial infiltration in keeping with inflammatory change. No new loculated fluid collections are identified. IMPRESSION: 1. Improved left upper quadrant abdominal wall abscess with residual 1.4 x 1.8 cm fluid collection and persistent inflammatory change. No new loculated fluid collections. 2. Progressive dilation of the extrahepatic bile duct to 15 mm with abrupt caliber change within the head of the pancreas. Known  pancreatic head mass is not clearly identified. No intrahepatic biliary ductal dilation. 3. Omental infiltration consistent with peritoneal carcinomatosis, unchanged from prior examination. 4. Diffuse mixed lytic and sclerotic patterns within the axial skeleton consistent with widespread osseous metastatic disease. No acute bone abnormality. Electronically signed by: Dorethia Molt MD 03/05/2024 01:32 AM EDT RP Workstation: [id number]   CT Angio Chest PE W and/or Wo Contrast Result Date: 02/27/2024 EXAM: CTA CHEST PE WITHOUT AND WITH CONTRAST CT ABDOMEN AND PELVIS WITHOUT AND WITH CONTRAST  02/27/2024 09:35:50 PM TECHNIQUE: CTA of the chest was performed after the administration of intravenous contrast. Multiplanar reformatted images are provided for review. MIP images are provided for review. CT of the abdomen and pelvis was performed with the administration of intravenous contrast. Automated exposure control, iterative reconstruction, and/or weight based adjustment of the mA/kV was utilized to reduce the radiation dose to as low as reasonably achievable. COMPARISON: CT abdomen and pelvis 02/02/2024; x-ray 02/27/2024 and CT 12/22/2023. CLINICAL HISTORY: Pulmonary embolism (PE) suspected, high prob. Pt arrives via wheelchair from the cancer center. PT was being seen today for a scheduled visit, and was found to be TACHYcardic 130+, and a low hemoglobin. Pt refused admission, but agreed to come to the ED for a transfusion. Pt very adamant that she not be admitted to the hospital during triage. Port accessed prior to arrival. Sepsis. FINDINGS: CHEST: PULMONARY ARTERIES: Pulmonary arteries are adequately opacified for evaluation. No intraluminal filling defect to suggest pulmonary embolism. Main pulmonary artery is normal in caliber. MEDIASTINUM: No mediastinal lymphadenopathy. The heart and pericardium demonstrate no acute abnormality. There is no acute abnormality of the thoracic aorta. LUNGS AND PLEURA: Similar appearing bilateral upper lobe clustered centrilobular nodules. The largest nodule measures 6 mm in the right apex (series 11 image 25). This is unchanged from 12/22/2023. No new focal consolidation. No pleural effusion or pneumothorax. SOFT TISSUES AND BONES: No acute bone or soft tissue abnormality. 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 ABDOMEN AND PELVIS: LIVER: The liver is unremarkable. GALLBLADDER AND BILE DUCTS: Gallbladder is unremarkable. Unchanged dilation of the common bile duct measuring 12 mm. SPLEEN: Spleen demonstrates no acute abnormality. PANCREAS:  Pancreatic head mass seen on MRI is not well visualized via CT. No ductal dilation or peripancreatic inflammatory stranding. ADRENAL GLANDS: Adrenal glands demonstrate no acute abnormality. KIDNEYS, URETERS AND BLADDER: No stones in the kidneys or ureters. No hydronephrosis. No perinephric or periureteral stranding. Urinary bladder is unremarkable. GI AND BOWEL: Percutaneous gastrostomy tube in the stomach. The previous percutaneous jejunostomy catheter has been removed. There is a multiloculated peripherally enhancing fluid collection in the left peritoneum extending into the left abdominal oblique musculature at the site of the prior jejunostomy. The fluid collection measures 5.2 x 4.0 cm in the axial plane. There is a soft tissue tract containing gas and fluid from the loculated fluid collection extending to the skin surface (series 5 image [id number]). Subcutaneous fat stranding adjacent to the fluid collection. The fluid collection closely abuts the jejunum. If there is concern for contained perforation, CT with IV and oral contrast is recommended. Stomach and duodenal sweep demonstrate no acute abnormality. There is no bowel obstruction. REPRODUCTIVE: No acute abnormality. PERITONEUM AND RETROPERITONEUM: No free air. Similar infiltrative soft tissue in the omentum. Previously seen peritoneal nodule in the left lower quadrant is not well visualized and may be obscured by the calculated fluid collection. LYMPH NODES: No lymphadenopathy. BONES AND SOFT TISSUES: Unchanged chronic compression fracture of L3. Diffuse sclerotic metastases in the  thoracolumbar spine and pelvis. IMPRESSION: 1. Multiloculated peripherally enhancing fluid collection in the left peritoneum extending into the left abdominal oblique musculature at the site of the prior jejunostomy with a soft tissue tract extending to the skin surface. The fluid collection closely abuts the jejunum. If there is concern for contained perforation, CT with IV and  oral contrast is recommended. 2. 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 3. Known pancreatic head mass not well visualized. 4. Similar peritoneal carcinomatosis. 5. Similar indeterminate cluster nodularity in the bilateral upper lobes. This may be infectious/inflammatory or due to metastases. 6. No pulmonary embolism. Electronically signed by: Norman Gatlin MD 02/27/2024 10:00 PM EDT RP Workstation: [id number]   CT ABDOMEN PELVIS W CONTRAST Result Date: 02/27/2024 EXAM: CTA CHEST PE WITHOUT AND WITH CONTRAST CT ABDOMEN AND PELVIS WITHOUT AND WITH CONTRAST 02/27/2024 09:35:50 PM TECHNIQUE: CTA of the chest was performed after the administration of intravenous contrast. Multiplanar reformatted images are provided for review. MIP images are provided for review. CT of the abdomen and pelvis was performed with the administration of intravenous contrast. Automated exposure control, iterative reconstruction, and/or weight based adjustment of the mA/kV was utilized to reduce the radiation dose to as low as reasonably achievable. COMPARISON: CT abdomen and pelvis 02/02/2024; x-ray 02/27/2024 and CT 12/22/2023. CLINICAL HISTORY: Pulmonary embolism (PE) suspected, high prob. Pt arrives via wheelchair from the cancer center. PT was being seen today for a scheduled visit, and was found to be TACHYcardic 130+, and a low hemoglobin. Pt refused admission, but agreed to come to the ED for a transfusion. Pt very adamant that she not be admitted to the hospital during triage. Port accessed prior to arrival. Sepsis. FINDINGS: CHEST: PULMONARY ARTERIES: Pulmonary arteries are adequately opacified for evaluation. No intraluminal filling defect to suggest pulmonary embolism. Main pulmonary artery is normal in caliber. MEDIASTINUM: No mediastinal lymphadenopathy. The heart and pericardium demonstrate no acute abnormality. There is no acute abnormality of the thoracic aorta. LUNGS AND PLEURA: Similar appearing  bilateral upper lobe clustered centrilobular nodules. The largest nodule measures 6 mm in the right apex (series 11 image 25). This is unchanged from 12/22/2023. No new focal consolidation. No pleural effusion or pneumothorax. SOFT TISSUES AND BONES: No acute bone or soft tissue abnormality. 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 ABDOMEN AND PELVIS: LIVER: The liver is unremarkable. GALLBLADDER AND BILE DUCTS: Gallbladder is unremarkable. Unchanged dilation of the common bile duct measuring 12 mm. SPLEEN: Spleen demonstrates no acute abnormality. PANCREAS: Pancreatic head mass seen on MRI is not well visualized via CT. No ductal dilation or peripancreatic inflammatory stranding. ADRENAL GLANDS: Adrenal glands demonstrate no acute abnormality. KIDNEYS, URETERS AND BLADDER: No stones in the kidneys or ureters. No hydronephrosis. No perinephric or periureteral stranding. Urinary bladder is unremarkable. GI AND BOWEL: Percutaneous gastrostomy tube in the stomach. The previous percutaneous jejunostomy catheter has been removed. There is a multiloculated peripherally enhancing fluid collection in the left peritoneum extending into the left abdominal oblique musculature at the site of the prior jejunostomy. The fluid collection measures 5.2 x 4.0 cm in the axial plane. There is a soft tissue tract containing gas and fluid from the loculated fluid collection extending to the skin surface (series 5 image [id number]). Subcutaneous fat stranding adjacent to the fluid collection. The fluid collection closely abuts the jejunum. If there is concern for contained perforation, CT with IV and oral contrast is recommended. Stomach and duodenal sweep demonstrate no acute abnormality. There is no bowel obstruction. REPRODUCTIVE: No  acute abnormality. PERITONEUM AND RETROPERITONEUM: No free air. Similar infiltrative soft tissue in the omentum. Previously seen peritoneal nodule in the left lower quadrant is not well  visualized and may be obscured by the calculated fluid collection. LYMPH NODES: No lymphadenopathy. BONES AND SOFT TISSUES: Unchanged chronic compression fracture of L3. Diffuse sclerotic metastases in the thoracolumbar spine and pelvis. IMPRESSION: 1. Multiloculated peripherally enhancing fluid collection in the left peritoneum extending into the left abdominal oblique musculature at the site of the prior jejunostomy with a soft tissue tract extending to the skin surface. The fluid collection closely abuts the jejunum. If there is concern for contained perforation, CT with IV and oral contrast is recommended. 2. 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 3. Known pancreatic head mass not well visualized. 4. Similar peritoneal carcinomatosis. 5. Similar indeterminate cluster nodularity in the bilateral upper lobes. This may be infectious/inflammatory or due to metastases. 6. No pulmonary embolism. Electronically signed by: Norman Gatlin MD 02/27/2024 10:00 PM EDT RP Workstation: [id number]   DG Chest Port 1 View Result Date: 02/27/2024 CLINICAL DATA:  Questionable sepsis - evaluate for abnormality. Tachycardia. EXAM: PORTABLE CHEST 1 VIEW COMPARISON:  02/03/2024 FINDINGS: Right Port-A-Cath in place with the tip at the cavoatrial junction. Heart and mediastinal contours are within normal limits. No focal opacities or effusions. No acute bony abnormality. IMPRESSION: No active disease. Electronically Signed   By: Franky Crease M.D.   On: 02/27/2024 19:02     Subjective:   Discharge Exam: Vitals:   03/28/24 1307 03/28/24 1526  BP: 104/66 113/72  Pulse: 78 90  Resp: 15   Temp: 98.5 F (36.9 C) 99.3 F (37.4 C)  SpO2: 99% 100%   Vitals:   03/28/24 1251 03/28/24 1252 03/28/24 1307 03/28/24 1526  BP: 102/68 102/68 104/66 113/72  Pulse: 82 82 78 90  Resp:  16 15   Temp: 98.4 F (36.9 C) 98.4 F (36.9 C) 98.5 F (36.9 C) 99.3 F (37.4 C)  TempSrc: Oral Oral Oral Oral  SpO2:   100% 99% 100%  Weight:      Height:        Physical Exam: GEN: NAD, alert and oriented x 3 HEENT: NCAT, PERRL, EOMI, sclera clear, MMM PULM: CTAB w/o wheezes/crackles, normal respiratory effort, on room air, right chest Port-A-Cath noted in place CV: RRR w/o M/G/R GI: abd soft, NTND, + BS, ostomy pouch noted MSK: no peripheral edema, moves all extremities independently NEURO: CN II-XII intact, no focal deficits, sensation to light touch intact PSYCH: normal mood/affect    The results of significant diagnostics from this hospitalization (including imaging, microbiology, ancillary and laboratory) are listed below for reference.     Microbiology: No results found for this or any previous visit (from the past 240 hours).   Labs: BNP (last 3 results) No results for input(s): BNP in the last 8760 hours. Basic Metabolic Panel: Recent Labs  Lab 03/24/24 0030 03/25/24 0303 03/25/24 1724 03/26/24 0406 03/26/24 1315 03/27/24 0413 03/27/24 1206 03/27/24 1724 03/27/24 2328 03/28/24 0507 03/28/24 1108  NA 137 136  --  135  --  134*  --   --   --  136  --   K 3.2* 3.6  --  3.8  --  4.1  --   --   --  4.0  --   CL 104 105  --  106  --  107  --   --   --  108  --   CO2 23  20*  --  19*  --  19*  --   --   --  19*  --   GLUCOSE 102* 92  --  87  --  100*  --   --   --  83  --   BUN <5* <5*  --  <5*  --  <5*  --   --   --  <5*  --   CREATININE 0.34* 0.35*  --  0.33*  --  0.30*  --   --   --  0.41*  --   CALCIUM  5.0* 4.8*   < > 6.4*   < > 6.8* 7.4* 7.7* 7.8* 7.9* 8.1*  MG 1.8 1.7  --  1.4*  --  1.9  --   --   --  1.8  --   PHOS 2.3* 2.4*  --  2.3*  --  2.8  --   --   --  2.0*  --    < > = values in this interval not displayed.   Liver Function Tests: Recent Labs  Lab 03/24/24 0030 03/25/24 0303 03/26/24 0406 03/27/24 0413 03/28/24 0507  AST 57* 39 28 37 23  ALT 108* 84* 60* 54* 37  ALKPHOS 1,398* 1,316* 1,165* 1,049* 912*  BILITOT 0.4 0.4 0.4 0.4 0.3  PROT 5.4* 5.4* 5.2*  5.0* 4.9*  ALBUMIN  3.1* 3.1* 3.0* 3.0* 2.7*   No results for input(s): LIPASE, AMYLASE in the last 168 hours. No results for input(s): AMMONIA in the last 168 hours. CBC: Recent Labs  Lab 03/22/24 0527 03/22/24 1750 03/23/24 0251 03/24/24 0030 03/25/24 0303 03/26/24 0406 03/27/24 0413 03/28/24 0507  WBC 3.8*  --  4.2 4.1 4.4 3.8* 4.1 3.1*  NEUTROABS 1.9  --  2.4 2.4 2.5  --   --   --   HGB 7.0*   < > 8.2* 8.4* 8.5* 8.1* 8.4* 7.3*  HCT 22.4*   < > 25.7* 26.7* 27.4* 25.0* 25.7* 23.2*  MCV 80.9  --  81.1 81.4 82.0 81.2 82.4 83.8  PLT 126*  --  124* 114* 115* 110* 114* 112*   < > = values in this interval not displayed.   Cardiac Enzymes: No results for input(s): CKTOTAL, CKMB, CKMBINDEX, TROPONINI in the last 168 hours. BNP: Invalid input(s): POCBNP CBG: Recent Labs  Lab 03/26/24 0620  GLUCAP 79   D-Dimer No results for input(s): DDIMER in the last 72 hours. Hgb A1c No results for input(s): HGBA1C in the last 72 hours. Lipid Profile No results for input(s): CHOL, HDL, LDLCALC, TRIG, CHOLHDL, LDLDIRECT in the last 72 hours. Thyroid  function studies No results for input(s): TSH, T4TOTAL, T3FREE, THYROIDAB in the last 72 hours.  Invalid input(s): FREET3 Anemia work up No results for input(s): VITAMINB12, FOLATE, FERRITIN, TIBC, IRON, RETICCTPCT in the last 72 hours. Urinalysis    Component Value Date/Time   COLORURINE YELLOW 02/22/2024 1223   APPEARANCEUR CLEAR 02/22/2024 1223   LABSPEC 1.015 02/22/2024 1223   PHURINE 6.0 02/22/2024 1223   GLUCOSEU NEGATIVE 02/22/2024 1223   HGBUR NEGATIVE 02/22/2024 1223   BILIRUBINUR NEGATIVE 02/22/2024 1223   KETONESUR 5 (A) 02/22/2024 1223   PROTEINUR NEGATIVE 02/22/2024 1223   UROBILINOGEN 0.2 11/01/2013 1246   NITRITE NEGATIVE 02/22/2024 1223   LEUKOCYTESUR NEGATIVE 02/22/2024 1223   Sepsis Labs Recent Labs  Lab 03/25/24 0303 03/26/24 0406 03/27/24 0413  03/28/24 0507  WBC 4.4 3.8* 4.1 3.1*   Microbiology No results found for this or any previous visit (  from the past 240 hours).   Time coordinating discharge: Over 30 minutes  SIGNED:   Camellia PARAS [place], DO  [facility] 03/28/2024, 3:39 PM

## 2024-03-28 NOTE — Progress Notes (Signed)
 Blood transfusion finished, MD notified, instructions reviewed with understanding verbalized, belongings packed, dressed, Port deaccessed, transferred to front entrance via wheelchair.

## 2024-03-28 NOTE — Plan of Care (Signed)
  Problem: Health Behavior/Discharge Planning: Goal: Ability to manage health-related needs will improve 03/28/2024 1352 by Alaina Dozier PARAS, RN Outcome: Adequate for Discharge 03/28/2024 0744 by Alaina Dozier PARAS, RN Outcome: Progressing   Problem: Clinical Measurements: Goal: Ability to maintain clinical measurements within normal limits will improve 03/28/2024 1352 by Alaina Dozier PARAS, RN Outcome: Adequate for Discharge 03/28/2024 0744 by Alaina Dozier PARAS, RN Outcome: Progressing Goal: Will remain free from infection 03/28/2024 1352 by Alaina Dozier PARAS, RN Outcome: Adequate for Discharge 03/28/2024 0744 by Alaina Dozier PARAS, RN Outcome: Progressing Goal: Diagnostic test results will improve 03/28/2024 1352 by Alaina Dozier PARAS, RN Outcome: Adequate for Discharge 03/28/2024 0744 by Alaina Dozier PARAS, RN Outcome: Progressing Goal: Respiratory complications will improve 03/28/2024 1352 by Alaina Dozier PARAS, RN Outcome: Adequate for Discharge 03/28/2024 0744 by Alaina Dozier PARAS, RN Outcome: Progressing Goal: Cardiovascular complication will be avoided Outcome: Adequate for Discharge   Problem: Activity: Goal: Risk for activity intolerance will decrease Outcome: Adequate for Discharge   Problem: Nutrition: Goal: Adequate nutrition will be maintained Outcome: Adequate for Discharge   Problem: Coping: Goal: Level of anxiety will decrease Outcome: Adequate for Discharge   Problem: Elimination: Goal: Will not experience complications related to bowel motility Outcome: Adequate for Discharge Goal: Will not experience complications related to urinary retention Outcome: Adequate for Discharge   Problem: Pain Managment: Goal: General experience of comfort will improve and/or be controlled Outcome: Adequate for Discharge   Problem: Safety: Goal: Ability to remain free from injury will improve Outcome: Adequate for Discharge   Problem:  Skin Integrity: Goal: Risk for impaired skin integrity will decrease Outcome: Adequate for Discharge

## 2024-03-28 NOTE — Progress Notes (Signed)
 Discharge medication delivered to patient at the bedside in a secure bag

## 2024-03-28 NOTE — Consult Note (Signed)
 WOC Nurse Consult Note: Reason for Consult: sacral pressure injury Wound type: Stage 2 Pressure Injury Pressure Injury POA: No Measurement: see nursing flow sheets Wound bed:100% pink, moist Drainage (amount, consistency, odor) see nursing flow sheets Periwound: intact  Dressing procedure/placement/frequency: Cleanse sacral wound with saline, pat dry. Cover with single layer of xeroform gauze, top with foam dressing. Ok to change foam every 3 days, lift to change xeroform gauze daily.    Re consult if needed, will not follow at this time. Thanks  Faylynn Stamos [employer], RN,CWOCN, CNS, [employer] [phone]

## 2024-03-28 NOTE — TOC Transition Note (Signed)
 Transition of Care [facility]) - Discharge Note   Patient Details  Name: Chelsea PIGGOTT MRN: [id number] Date of Birth: 1970-11-17  [facility] of [facility]) CM/SW Contact:  Bascom Service, RN Phone Number: 03/28/2024, 3:55 PM   Clinical Narrative: d/c plan home. Has own transport home.      Final next level of care: Home/Self Care Barriers to Discharge: No Barriers Identified   Patient Goals and CMS Choice Patient states their goals for this hospitalization and ongoing recovery are:: Home CMS Medicare.gov Compare Post Acute Care list provided to:: Patient Choice offered to / list presented to : Patient [place] ownership interest in [facility].provided to:: Patient    Discharge Placement                       Discharge Plan and Services Additional resources added to the After Visit Summary for     Discharge Planning Services: CM Consult                                 Social Drivers of Health (SDOH) Interventions SDOH Screenings   Food Insecurity: No Food Insecurity (03/19/2024)  Recent Concern: Food Insecurity - Food Insecurity Present (02/16/2024)  Housing: Low Risk  (03/19/2024)  Transportation Needs: No Transportation Needs (03/19/2024)  Utilities: Not At Risk (03/19/2024)  Depression (PHQ2-9): Low Risk  (02/25/2024)  Recent Concern: Depression (PHQ2-9) - Medium Risk (01/25/2024)  Financial Resource Strain: High Risk (02/13/2024)  Physical Activity: Inactive (02/13/2024)  Social Connections: Moderately Isolated (02/13/2024)  Stress: No Stress Concern Present (02/13/2024)  Tobacco Use: Low Risk  (03/19/2024)  Recent Concern: Tobacco Use - High Risk (01/27/2024)   Received from [facility]  Health Literacy: Adequate Health Literacy (02/13/2024)     Readmission Risk Interventions    03/20/2024    3:17 PM 03/05/2024    4:03 PM 12/07/2023   12:21 PM  Readmission Risk Prevention Plan  Transportation Screening  Complete Complete   Medication Review (RN Care Manager) Complete Complete Complete  PCP or Specialist appointment within 3-5 days of discharge Complete Complete Complete  HRI or Home Care Consult Complete Complete Complete  SW Recovery Care/Counseling Consult Complete Complete Complete  Palliative Care Screening Not Applicable Not Applicable Not Applicable  Skilled Nursing Facility Not Applicable Not Applicable Not Applicable

## 2024-03-29 ENCOUNTER — Other Ambulatory Visit (HOSPITAL_COMMUNITY): Payer: Self-pay

## 2024-03-29 ENCOUNTER — Other Ambulatory Visit: Payer: Self-pay

## 2024-03-29 ENCOUNTER — Other Ambulatory Visit: Payer: Self-pay | Admitting: Nurse Practitioner

## 2024-03-29 ENCOUNTER — Encounter: Payer: Self-pay | Admitting: Hematology and Oncology

## 2024-03-29 ENCOUNTER — Telehealth: Payer: Self-pay | Admitting: *Deleted

## 2024-03-29 DIAGNOSIS — C50911 Malignant neoplasm of unspecified site of right female breast: Secondary | ICD-10-CM

## 2024-03-29 LAB — TYPE AND SCREEN
ABO/RH(D): B POS
Antibody Screen: NEGATIVE
Unit division: 0

## 2024-03-29 LAB — BPAM RBC
Blood Product Expiration Date: 202510302359
ISSUE DATE / TIME: 202510081234
Unit Type and Rh: 7300

## 2024-03-29 MED ORDER — OXYCODONE HCL 15 MG PO TABS
15.0000 mg | ORAL_TABLET | ORAL | 0 refills | Status: DC | PRN
  Filled 2024-03-29 (×2): qty 90, 15d supply, fill #0

## 2024-03-29 NOTE — Transitions of Care (Post Inpatient/ED Visit) (Signed)
 03/29/2024  Name: Chelsea Hansen MRN: [id number] DOB: [birth year]  Today's TOC FU Call Status: Today's TOC FU Call Status:: Successful TOC FU Call Completed TOC FU Call Complete Date: 03/29/24 Patient's Name and Date of Birth confirmed.  Transition Care Management Follow-up Telephone Call Date of Discharge: 03/28/24 Discharge Facility: [facility]) Type of Discharge: Inpatient Admission Primary Inpatient Discharge Diagnosis:: Nausea vomiting and diarrhea How have you been since you were released from the hospital?: Same (Nausea and vomiting improved, diarrhea returned) Any questions or concerns?: No  Items Reviewed: Did you receive and understand the discharge instructions provided?: Yes Medications obtained,verified, and reconciled?: Yes (Medications Reviewed) Any new allergies since your discharge?: No Dietary orders reviewed?: Yes Type of Diet Ordered:: Regular Do you have support at home?: Yes People in Home [RPT]: child(ren), adult Name of Support/Comfort Primary Source: Son/Chelsea Hansen and Chelsea Hansen  Medications Reviewed Today: Medications Reviewed Today     Reviewed by Chelsea Hansen LABOR, RN (Registered Nurse) on 03/29/24 at [phone]  Med List Status: <None>   Medication Order Taking? Sig Documenting Provider Last Dose Status Informant  acetaminophen  (TYLENOL ) 325 MG tablet [id number]  Take 2 tablets (650 mg total) by mouth every 6 (six) hours as needed for mild pain (pain score 1-3) or fever (or Fever >/= 101).  Patient not taking: Reported on 03/29/2024   Chelsea Leatrice FERNS, MD  Active Self  calcium  carbonate (OS-CAL - DOSED IN MG OF ELEMENTAL CALCIUM ) 1250 (500 Ca) MG tablet [id number] Yes Take 1 tablet (1,250 mg total) by mouth 2 (two) times daily with a meal. [place], Camellia PARAS, DO  Active   fentaNYL  (DURAGESIC ) 25 MCG/HR [id number] Yes Place 1 patch onto the skin every 3 (three) days. Chelsea Leatrice FERNS, MD  Active Self  lidocaine -prilocaine  (EMLA ) cream [id number] Yes Apply to  affected area once  Patient taking differently: Apply 1 Application topically daily as needed (for port).   [name], [name], MD  Active Self  magnesium  oxide (MAG-OX) 400 (240 Mg) MG tablet [id number] Yes Take 1 tablet (400 mg total) by mouth 2 (two) times daily. [place], Camellia PARAS, DO  Active   ondansetron  (ZOFRAN ) 8 MG tablet [id number] Yes Take 1 tablet (8 mg total) by mouth every 8 (eight) hours as needed for nausea or vomiting. Start on the third day after chemotherapy. [name], [name], MD  Active Self  oxyCODONE  (ROXICODONE ) 15 MG immediate release tablet [id number] Yes Take 1 tablet (15 mg total) by mouth every 4 (four) hours as needed for pain. Chelsea Hansen, Chelsea SAILOR, NP  Active Self  prochlorperazine  (COMPAZINE ) 10 MG tablet [id number] Yes Take 1 tablet (10 mg total) by mouth every 6 (six) hours as needed for nausea or vomiting. [name], [name], MD  Active Self  Med List Note Rockney Norleen LABOR, RPH-CPP 07/07/22 [id number]): Abemaciclib  through [facility];   UDS 02/11/22  MR 05/22/22 Medication Agreement signed. LP  Hydrocodone  approved through 5 13 23             Home Care and Equipment/Supplies: Were Home Health Services Ordered?: No Any new equipment or medical supplies ordered?: No  Functional Questionnaire: Do you need assistance with bathing/showering or dressing?: Yes Do you need assistance with meal preparation?: Yes Do you need assistance with eating?: No Do you have difficulty maintaining continence: No Do you need assistance with getting out of bed/getting out of a chair/moving?: No Do you have difficulty managing or taking your medications?: No  Follow up appointments reviewed: PCP Follow-up appointment confirmed?: No (Patient planning  to call and schedule with PCP today) MD Provider Line Number:[phone] Given: No Specialist Hospital Follow-up appointment confirmed?: Yes Date of Specialist follow-up appointment?: 04/04/24 Follow-Up Specialty Provider:: Cancer Center with Dr.  Odean Do you need transportation to your follow-up appointment?: No Do you understand care options if your condition(s) worsen?: Yes-patient verbalized understanding  SDOH Interventions Today    Flowsheet Row Most Recent Value  SDOH Interventions   Food Insecurity Interventions Intervention Not Indicated  Housing Interventions Intervention Not Indicated  Transportation Interventions Intervention Not Indicated  Utilities Interventions Intervention Not Indicated    Goals Addressed             This Visit's Progress    VBCI Transitions of Care (TOC) Care Plan       Problems:  Recent Hospitalization for treatment of Nausea, vomiting and diarrhea related to cancer treatment Home Health services barrier: Patient would like PCS in the home to assist with bathing and meal prep  Goal:  Over the next 30 days, the patient will not experience hospital readmission  Interventions:  Transitions of Care: Durable Medical Equipment (DME) reviewed with patient/caregiver SDOH assessment Referral to BSW for assistance with connecting to [facility] Case Manager Discussed referral to LCSW for managing stress due to chronic illness-patient will consider  Advised patient to connect with her insurance plan to inquire about PCS coverage Discussed the patient will need a face to face visit with PCP if applying for PCS-patient will contact PCP office today to schedule Educated patient on BRAT diet and avoiding dairy products when having diarrhea Reviewed Medications-ensured patient has antiemetics on hand for nausea Reviewed provider notes and discussed the plan for IVF after future chemotherapy treatments Discussed the importance of hydration, advised increasing water  intake while nausea has subsided  Patient Self Care Activities:  Attend all scheduled provider appointments Call provider office for new concerns or questions  Notify RN Care Manager of TOC call rescheduling needs Participate in  Transition of Care Program/Attend [place] scheduled calls Work with the social worker to address care coordination needs and will continue to work with the clinical team to address health care and disease management related needs Increase water  intake Follow up with insurance plan regarding PCS  Plan:  Telephone follow up appointment with care management team member scheduled for:  04/05/24 at 10am with Mliss, RN        Hansen Dimes RN, BSN [place]  [facility] RN Care Manager [phone]

## 2024-03-30 ENCOUNTER — Other Ambulatory Visit: Payer: Self-pay

## 2024-03-30 ENCOUNTER — Telehealth: Payer: Self-pay | Admitting: Licensed Clinical Social Worker

## 2024-03-30 ENCOUNTER — Inpatient Hospital Stay (HOSPITAL_BASED_OUTPATIENT_CLINIC_OR_DEPARTMENT_OTHER): Payer: MEDICAID | Admitting: Adult Health

## 2024-03-30 ENCOUNTER — Other Ambulatory Visit (HOSPITAL_COMMUNITY): Payer: Self-pay

## 2024-03-30 ENCOUNTER — Inpatient Hospital Stay: Payer: MEDICAID

## 2024-03-30 ENCOUNTER — Encounter: Payer: Self-pay | Admitting: Licensed Clinical Social Worker

## 2024-03-30 ENCOUNTER — Encounter: Payer: Self-pay | Admitting: Hematology and Oncology

## 2024-03-30 ENCOUNTER — Inpatient Hospital Stay: Payer: MEDICAID | Attending: Adult Health

## 2024-03-30 ENCOUNTER — Encounter: Payer: Self-pay | Admitting: *Deleted

## 2024-03-30 VITALS — BP 109/72 | HR 76 | Resp 14

## 2024-03-30 VITALS — BP 103/71 | HR 84 | Temp 98.0°F | Resp 16 | Wt 161.3 lb

## 2024-03-30 DIAGNOSIS — G893 Neoplasm related pain (acute) (chronic): Secondary | ICD-10-CM | POA: Insufficient documentation

## 2024-03-30 DIAGNOSIS — D649 Anemia, unspecified: Secondary | ICD-10-CM | POA: Insufficient documentation

## 2024-03-30 DIAGNOSIS — Z170 Estrogen receptor positive status [ER+]: Secondary | ICD-10-CM | POA: Insufficient documentation

## 2024-03-30 DIAGNOSIS — Z79811 Long term (current) use of aromatase inhibitors: Secondary | ICD-10-CM | POA: Insufficient documentation

## 2024-03-30 DIAGNOSIS — C50411 Malignant neoplasm of upper-outer quadrant of right female breast: Secondary | ICD-10-CM | POA: Diagnosis present

## 2024-03-30 DIAGNOSIS — R197 Diarrhea, unspecified: Secondary | ICD-10-CM | POA: Insufficient documentation

## 2024-03-30 DIAGNOSIS — C7951 Secondary malignant neoplasm of bone: Secondary | ICD-10-CM | POA: Diagnosis not present

## 2024-03-30 DIAGNOSIS — E876 Hypokalemia: Secondary | ICD-10-CM

## 2024-03-30 LAB — CBC WITH DIFFERENTIAL (CANCER CENTER ONLY)
Abs Immature Granulocytes: 0.13 K/uL — ABNORMAL HIGH (ref 0.00–0.07)
Basophils Absolute: 0 K/uL (ref 0.0–0.1)
Basophils Relative: 1 %
Eosinophils Absolute: 0.1 K/uL (ref 0.0–0.5)
Eosinophils Relative: 1 %
HCT: 29.1 % — ABNORMAL LOW (ref 36.0–46.0)
Hemoglobin: 10 g/dL — ABNORMAL LOW (ref 12.0–15.0)
Immature Granulocytes: 3 %
Lymphocytes Relative: 31 %
Lymphs Abs: 1.2 K/uL (ref 0.7–4.0)
MCH: 26.6 pg (ref 26.0–34.0)
MCHC: 34.4 g/dL (ref 30.0–36.0)
MCV: 77.4 fL — ABNORMAL LOW (ref 80.0–100.0)
Monocytes Absolute: 0.5 K/uL (ref 0.1–1.0)
Monocytes Relative: 12 %
Neutro Abs: 2.1 K/uL (ref 1.7–7.7)
Neutrophils Relative %: 52 %
Platelet Count: 146 K/uL — ABNORMAL LOW (ref 150–400)
RBC: 3.76 MIL/uL — ABNORMAL LOW (ref 3.87–5.11)
RDW: 16.3 % — ABNORMAL HIGH (ref 11.5–15.5)
WBC Count: 3.9 K/uL — ABNORMAL LOW (ref 4.0–10.5)
nRBC: 0 % (ref 0.0–0.2)

## 2024-03-30 LAB — CMP (CANCER CENTER ONLY)
ALT: 22 U/L (ref 0–44)
AST: 14 U/L — ABNORMAL LOW (ref 15–41)
Albumin: 3.2 g/dL — ABNORMAL LOW (ref 3.5–5.0)
Alkaline Phosphatase: 893 U/L — ABNORMAL HIGH (ref 38–126)
Anion gap: 6 (ref 5–15)
BUN: <5 mg/dL — ABNORMAL LOW (ref 6–20)
CO2: 22 mmol/L (ref 22–32)
Calcium: 6 mg/dL — CL (ref 8.9–10.3)
Chloride: 109 mmol/L (ref 98–111)
Creatinine: 0.32 mg/dL — ABNORMAL LOW (ref 0.44–1.00)
GFR, Estimated: >60 mL/min (ref >60)
Glucose, Bld: 96 mg/dL (ref 70–99)
Potassium: 3.2 mmol/L — ABNORMAL LOW (ref 3.5–5.1)
Sodium: 137 mmol/L (ref 135–145)
Total Bilirubin: 0.5 mg/dL (ref 0.0–1.2)
Total Protein: 5.9 g/dL — ABNORMAL LOW (ref 6.5–8.1)

## 2024-03-30 LAB — MAGNESIUM: Magnesium: 1.4 mg/dL — ABNORMAL LOW (ref 1.7–2.4)

## 2024-03-30 MED ORDER — POTASSIUM CHLORIDE 10 MEQ/100ML IV SOLN
10.0000 meq | INTRAVENOUS | Status: AC
  Administered 2024-03-30 (×2): 10 meq via INTRAVENOUS
  Filled 2024-03-30 (×2): qty 100

## 2024-03-30 MED ORDER — SODIUM CHLORIDE 0.9 % IV SOLN
2.0000 g | Freq: Once | INTRAVENOUS | Status: AC
  Administered 2024-03-30: 2 g via INTRAVENOUS
  Filled 2024-03-30: qty 20

## 2024-03-30 MED ORDER — MAGNESIUM SULFATE 2 GM/50ML IV SOLN
2.0000 g | Freq: Once | INTRAVENOUS | Status: AC
  Administered 2024-03-30: 2 g via INTRAVENOUS
  Filled 2024-03-30: qty 50

## 2024-03-30 MED ORDER — SODIUM CHLORIDE 0.9 % IV SOLN: Freq: Once | INTRAVENOUS | Status: AC

## 2024-03-30 MED ORDER — CALCITRIOL 0.25 MCG PO CAPS
0.2500 ug | ORAL_CAPSULE | Freq: Two times a day (BID) | ORAL | 0 refills | Status: DC
  Filled 2024-03-30: qty 14, 7d supply, fill #0
  Filled 2024-03-30: qty 46, 23d supply, fill #0

## 2024-03-30 MED ORDER — MAGNESIUM SULFATE 2 GM/50ML IV SOLN: 2.0000 g | Freq: Once | INTRAVENOUS | Status: DC

## 2024-03-30 MED ORDER — CALCIUM CARB-CHOLECALCIFEROL 600-10 MG-MCG PO TABS
1.0000 | ORAL_TABLET | Freq: Three times a day (TID) | ORAL | 0 refills | Status: DC
  Filled 2024-03-30: qty 150, 50d supply, fill #0

## 2024-03-30 NOTE — Progress Notes (Signed)
 CRITICAL VALUE STICKER  CRITICAL VALUE: Calcium  6.0  RECEIVER (on-site recipient of call): Larraine, RN  DATE & TIME NOTIFIED: 03/30/24 at 1030  MESSENGER (representative from lab): Hadassah  MD NOTIFIED: Morna Kendall, NP  TIME OF NOTIFICATION:03/30/24 at 1030  RESPONSE:  NP notified and verbalized understanding.

## 2024-03-30 NOTE — Progress Notes (Signed)
 Pt receiving IV Calcium  Gluconate, Potassium Chloride  and Magnesium  Sulfate today, per [facility] they are all compatible so can run K, Mag and Ca together.

## 2024-03-30 NOTE — Assessment & Plan Note (Signed)
 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  Hospitalization: 12/22/2023-01/17/2024: Intractable nausea vomiting (PEG tube: Venting gastrostomy, J-tube) Hospitalization 02/01/2024-02/06/2024: Intractable pain, nausea vomiting ---------------------------------------------------------------------------------------------------------------------- 06/10/2023: PET/CT: Interval development of hypermetabolic lymphadenopathy left submandibular region, throughout mediastinal, bilateral hilar, upper abdomen (no uptake in the bones)  02/02/2024: CT abdomen and pelvis: Pancreatic mass, right hydronephrosis, peritoneal carcinomatosis, bone metastases L3 Peritoneal biopsy 02/03/2024: Metastatic breast cancer, ER 100%, PR 0%, HER2 2+   Treatment plan: Enhertu  every 3 weeks started 02/16/2024 Hospitalization 03/04/2024-03/09/2024: Abdominal wall abscess 03/05/2024: CT abdomen and pelvis: Improved abscess 1.8 cm, dilation of extrahepatic bile duct, omental infiltration with peritoneal carcinomatosis, diffuse bone metastases

## 2024-03-30 NOTE — Patient Instructions (Signed)
Calcium Gluconate Injection What is this medication? CALCIUM GLUCONATE (KAL see um GLOO koe nate) increases calcium levels in your body. Calcium is a mineral that plays an important role in building strong bones and maintaining heart health. This medicine may be used for other purposes; ask your health care provider or pharmacist if you have questions. What should I tell my care team before I take this medication? They need to know if you have any of these conditions: High levels of calcium in the blood History of irregular heartbeat History of kidney stones Kidney disease Parathyroid disease An unusual or allergic reaction to calcium, other medications, foods, dyes, or preservatives Pregnant or trying to get pregnant Breast-feeding How should I use this medication? This medication is injected into a vein. It is given by your care team in a hospital or clinic setting. Talk to your care team about the use of this medication in children. While it may be given to children for selected conditions, precautions do apply. Overdosage: If you think you have taken too much of this medicine contact a poison control center or emergency room at once. NOTE: This medicine is only for you. Do not share this medicine with others. What if I miss a dose? This does not apply. What may interact with this medication? Ceftriaxone Certain diuretics Digoxin Other calcium products This list may not describe all possible interactions. Give your health care provider a list of all the medicines, herbs, non-prescription drugs, or dietary supplements you use. Also tell them if you smoke, drink alcohol, or use illegal drugs. Some items may interact with your medicine. What should I watch for while using this medication? Your condition will be monitored carefully while you are receiving this medication. You may need blood work while you are taking this medication. What side effects may I notice from receiving this  medication? Side effects that you should report to your care team as soon as possible: Allergic reactions--skin rash, itching, hives, swelling of the face, lips, tongue, or throat Fast or irregular heartbeat High calcium level--increased thirst or amount of urine, nausea, vomiting, confusion, unusual weakness or fatigue, bone pain Low blood pressure--dizziness, feeling faint or lightheaded, blurry vision Pain, redness, or irritation at injection site Side effects that usually do not require medical attention (report to your care team if they continue or are bothersome): Change in taste Flushing, mostly over the face, neck, and chest, during injection This list may not describe all possible side effects. Call your doctor for medical advice about side effects. You may report side effects to FDA at 1-800-FDA-1088. Where should I keep my medication? This medication is given in a hospital or clinic. It will not be stored at home. NOTE: This sheet is a summary. It may not cover all possible information. If you have questions about this medicine, talk to your doctor, pharmacist, or health care provider.  2024 Elsevier/Gold Standard (2021-12-28 00:00:00)

## 2024-03-30 NOTE — Progress Notes (Signed)
 [facility] Cancer Follow up:    Chelsea Rosaline SQUIBB, NP [id number] [place] [address]   DIAGNOSIS:  Cancer Staging  Breast cancer [facility]) Staging form: Breast, AJCC 8th Edition - Clinical stage from 12/17/2020: Stage IA (cT1b, cN0, cM0, G2, ER+, PR+, HER2-) - Signed by [name], NP on 01/14/2022 Stage prefix: Initial diagnosis Histologic grading system: 3 grade system Laterality: Left Staged by: Pathologist and managing physician Stage used in treatment planning: Yes National guidelines used in treatment planning: Yes Type of national guideline used in treatment planning: NCCN - Pathologic stage from 07/09/2021: ypTis (DCIS), ypN0, cM0 - Signed by [name], NP on 07/22/2021 Stage prefix: Post-therapy  Malignant neoplasm of upper-outer quadrant of right breast in female, estrogen receptor positive (HCC) Staging form: Breast, AJCC 8th Edition - Clinical stage from 12/17/2020: Stage IIA (cT2, cN1, cM0, G2, ER+, PR+, HER2-) - Signed by [name], NP on 01/14/2022 Stage prefix: Initial diagnosis Histologic grading system: 3 grade system Laterality: Right Staged by: Pathologist and managing physician Stage used in treatment planning: Yes National guidelines used in treatment planning: Yes Type of national guideline used in treatment planning: NCCN - Pathologic stage from 07/09/2021: ypT2, ypN1a, cM0, G2 - Signed by [name], NP on 07/22/2021 Stage prefix: Post-therapy Histologic grading system: 3 grade system - Pathologic: Stage IV (cM1) - Signed by [name], NP on 12/07/2022    SUMMARY OF ONCOLOGIC HISTORY: Oncology History  Malignant neoplasm of upper-outer quadrant of right breast in female, estrogen receptor positive (HCC)  12/12/2020 Initial Diagnosis   status post bilateral breast biopsies 12/03/2020, showing             (1) on the right, a clinical T2 N1, stage IIa invasive lobular  carcinoma, grade 2, estrogen and progesterone receptor strongly positive, HER2 not amplified, with an MIB-1 of 40%                         (a) the biopsied right axillary lymph node was positive with extracapsular extension                         (b) a second right breast mass also biopsied was a fibroadenoma, concordant  MAMMAPRINT tested on biopsy returned high risk, luminal type B, indicating significant benefit from chemo                         (c) biopsy of an area of non-mass-like enhancement in the upper right breast pending   12/17/2020 Cancer Staging   Staging form: Breast, AJCC 8th Edition - Clinical stage from 12/17/2020: Stage IIA (cT2, cN1, cM0, G2, ER+, PR+, HER2-) - Signed by [name], NP on 01/14/2022 Stage prefix: Initial diagnosis Histologic grading system: 3 grade system Laterality: Right Staged by: Pathologist and managing physician Stage used in treatment planning: Yes National guidelines used in treatment planning: Yes Type of national guideline used in treatment planning: NCCN   12/24/2020 Genetic Testing   Negative genetic testing:  No pathogenic variants detected on the [employer] BRCAplus panel (report date 12/24/2020) or the CancerNext-Expanded + RNAinsight panel (report date 12/31/2020). 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   The BRCAplus panel offered by [employer] and includes sequencing and deletion/duplication analysis for the following 8 genes: ATM, BRCA1, BRCA2, CDH1, CHEK2, PALB2, PTEN,  and TP53. The CancerNext-Expanded + RNAinsight gene panel offered by [employer] and includes sequencing and rearrangement analysis for the following 77 genes: AIP, ALK, APC, ATM, AXIN2, BAP1, BARD1, BLM, BMPR1A, BRCA1, BRCA2, BRIP1, CDC73, CDH1, CDK4, CDKN1B, CDKN2A, CHEK2, CTNNA1, DICER1, FANCC, FH, FLCN, GALNT12, KIF1B, LZTR1, MAX, MEN1, MET, MLH1, MSH2, MSH3, MSH6, MUTYH, NBN, NF1, NF2, NTHL1, PALB2,  PHOX2B, PMS2, POT1, PRKAR1A, PTCH1, PTEN, RAD51C, RAD51D, RB1, RECQL, RET, SDHA, SDHAF2, SDHB, SDHC, SDHD, SMAD4, SMARCA4, SMARCB1, SMARCE1, STK11, SUFU, TMEM127, TP53, TSC1, TSC2, VHL and XRCC2 (sequencing and deletion/duplication); EGFR, EGLN1, HOXB13, KIT, MITF, PDGFRA, POLD1 and POLE (sequencing only); EPCAM and GREM1 (deletion/duplication only). RNA data is routinely analyzed for use in variant interpretation for all genes.   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 Multifocal sclerotic bony metastatic disease greatest in the thoracic spine with scattered rib lesions and sclerosis as well. 2. No nodal or visceral metastasis about the chest. 3. Collateral pathways about the chest suggest some subclavian venous narrowing on the LEFT. This could also be related to arm position. Correlate clinically. 4. Signs of RIGHT breast lumpectomy. Potential postoperative changes in the LEFT breast with small focal area of nodularity in the lateral LEFT breast  which could also be postoperative, correlate clinically and consider mammographic correlation as warranted. 5. Mild cardiac enlargement.   05/18/2022 Treatment Plan Change   Anastrozole  + Verzenio  100mg  PO BID   12/07/2022 Cancer Staging   Staging form: Breast, AJCC 8th Edition - Pathologic: Stage IV (cM1) - Signed by [name], NP on 12/07/2022   02/16/2024 -  Chemotherapy   Patient is on Treatment Plan : BREAST Fam-Trastuzumab Deruxtecan-nxki  (Enhertu ) (5.4) q21d     Breast cancer (HCC)  12/12/2020 Initial Diagnosis   on the left, a clinical T1b N0, stage IA invasive ductal carcinoma, grade 1 or 2, estrogen and progesterone receptor positive, HER2 not amplified, with an MIB 1 of 15%   12/17/2020 Cancer Staging   Staging form: Breast, AJCC 8th Edition - Clinical stage from 12/17/2020: Stage IA (cT1b, cN0, cM0, G2, ER+, PR+, HER2-) - Signed by [name], NP on 01/14/2022 Stage prefix: Initial diagnosis Histologic grading system: 3 grade system Laterality: Left Staged by: Pathologist and managing physician Stage used in treatment planning: Yes National guidelines used in treatment planning: Yes Type of national guideline used in treatment planning: NCCN   12/24/2020 Genetic Testing   Negative genetic testing:  No pathogenic variants detected on the [employer] BRCAplus panel (report date 12/24/2020) or the CancerNext-Expanded + RNAinsight panel (report date 12/31/2020). A variant of  uncertain significance (VUS) was detected in the ATM gene called p.D44G (c.131A>G).   The BRCAplus panel offered by [employer] and includes sequencing and deletion/duplication analysis for the following 8 genes: ATM, BRCA1, BRCA2, CDH1, CHEK2, PALB2, PTEN, and TP53. The CancerNext-Expanded + RNAinsight gene panel offered by [employer] and includes sequencing and rearrangement analysis for the following 77 genes: AIP, ALK, APC, ATM, AXIN2, BAP1, BARD1, BLM, BMPR1A, BRCA1, BRCA2, BRIP1,  CDC73, CDH1, CDK4, CDKN1B, CDKN2A, CHEK2, CTNNA1, DICER1, FANCC, FH, FLCN, GALNT12, KIF1B, LZTR1, MAX, MEN1, MET, MLH1, MSH2, MSH3, MSH6, MUTYH, NBN, NF1, NF2, NTHL1, PALB2, PHOX2B, PMS2, POT1, PRKAR1A, PTCH1, PTEN, RAD51C, RAD51D, RB1, RECQL, RET, SDHA, SDHAF2, SDHB, SDHC, SDHD, SMAD4, SMARCA4, SMARCB1, SMARCE1, STK11, SUFU, TMEM127, TP53, TSC1, TSC2, VHL and XRCC2 (sequencing and deletion/duplication); EGFR, EGLN1, HOXB13, KIT, MITF, PDGFRA, POLD1 and POLE (sequencing only); EPCAM and GREM1 (deletion/duplication only). RNA data is routinely analyzed for use in variant interpretation for all genes.   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 Multifocal sclerotic bony metastatic disease greatest in the thoracic spine with scattered rib lesions and sclerosis as well. 2. No nodal or visceral metastasis about the chest. 3. Collateral pathways about the chest suggest some subclavian venous narrowing on the LEFT. This could also be related to arm position. Correlate clinically. 4. Signs of RIGHT  breast lumpectomy. Potential postoperative changes in the LEFT breast with small focal area of nodularity in the lateral LEFT breast which could also be postoperative, correlate clinically and consider mammographic correlation as warranted. 5. Mild cardiac enlargement.   05/18/2022 Treatment Plan Change   Anastrozole  + Verzenio  100mg  PO BID   06/23/2022 - 07/07/2022 Radiation Therapy   06/23/2022 through 07/07/2022 Site Technique Total Dose (Gy) Dose per Fx (Gy) Completed Fx Beam Energies  Lumbar Spine: Spine_LS_Pelv 3D 30/30 3 10/10 15X       CURRENT THERAPY: Enhertu   INTERVAL HISTORY:  Discussed the use of AI scribe software for clinical note transcription with the patient, who gave verbal consent to proceed.  History of Present Illness Chelsea Hansen is a 53 year old female who presents for follow-up and evaluation of electrolyte derangements.  She has persistent hypocalcemia, with recent calcium  levels decreasing from 8 to 6 since hospital discharge. She is not taking calcium  supplements regularly due to cost concerns and difficulty finding the recommended dosage over the counter, opting to purchase calcium  gummies from [facility] instead.  She underwent an interventional radiology procedure for a celiac plexus block to manage abdominal pain. She is using a fentanyl  patch applied yesterday and plans to pick up a prescription for oxycodone   today.   She has sacral pressure injuries. Initially experienced diarrhea upon returning home, with three episodes on Wednesday, but has not had any bowel movements since yesterday. No constipation, chest pain, palpitations, or shortness of breath.      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 02/01/2024   Generalized abdominal pain 01/17/2024   Elevated CA 19-9 level 01/16/2024   Hypophosphatemia 01/04/2024   Hypomagnesemia 01/04/2024   Pancreatic lesion 12/29/2023   Unintentional weight loss 12/29/2023   Nausea and vomiting 12/29/2023   Duodenal obstruction 12/29/2023   Abnormal MRI of abdomen 12/26/2023   Abnormal CT of the abdomen 12/23/2023   Elevated liver enzymes 12/23/2023   Acute kidney injury 12/23/2023   AKI (acute kidney injury) 12/22/2023   Elevated LFTs 12/22/2023   Pancreatic mass 12/22/2023   Right sided weakness 12/22/2023   Hypotension 12/22/2023   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   Abnormal CT scan, colon 12/14/2023   Acute esophagitis 12/14/2023   Candida esophagitis (HCC) 12/14/2023   Therapeutic opioid-induced constipation (OIC) 12/11/2023   Hypocalcemia 12/10/2023   Fever of unknown origin 12/10/2023   Sinus tachycardia 12/10/2023   Acute kidney injury superimposed on chronic kidney disease 12/05/2023   Electrolyte abnormality 12/05/2023   Dehydration 12/05/2023   QT prolongation 12/05/2023   Intractable nausea and vomiting 11/24/2023   Neoplasm related pain 12/07/2022   Constipation 06/30/2022   Nausea and vomiting in adult 06/29/2022   Hematemesis 06/28/2022   Breast cancer metastasized to bone, right (HCC) 05/20/2022   Supraventricular tachycardia 07/08/2021   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   GERD (gastroesophageal reflux disease) 05/27/2021   HLD (hyperlipidemia) 05/27/2021   Essential hypertension 05/27/2021   Pain management contract signed 05/04/2021   Chronic pain  syndrome (breast cancer) 05/04/2021   Palpitations 03/25/2021   Sinus bradycardia 03/25/2021   Daytime somnolence 03/25/2021   Snoring 03/25/2021   Obesity (BMI 30-39.9) 03/25/2021   Port-A-Cath in place 01/27/2021   Genetic testing 12/24/2020   Family history of uterine cancer 12/17/2020   Malignant neoplasm of upper-outer quadrant of right breast in female, estrogen receptor positive (HCC) 12/12/2020   Breast cancer (HCC) 12/12/2020   Hypokalemia 01/02/2012   Chronic asthma 01/01/2012   Bradycardia 01/01/2012   Syncope 12/31/2011   Mediastinal adenopathy 12/31/2011   Pancytopenia (HCC) 12/31/2011   Chest pain 12/31/2011    is allergic to enhertu  [fam-trastuzumab  deruxtec-nxki].  MEDICAL HISTORY: Past Medical History:  Diagnosis Date   Allergy    seasonal allergies   Anxiety    on meds   Asthma    uses inhaler   Breast cancer (HCC) 2012   RIGHT lumpectomy   Cancer (HCC) 2022   RIGHT breast lump-dx 2022   Depression    on meds   DVT (deep venous thrombosis) (HCC) 2010   after hysterectomy   Family history of uterine cancer    GERD (gastroesophageal reflux disease)    with certain foods/OTC PRN  meds   Headache(784.0)    History of radiation therapy    Bilateral breast- 09/03/21-11/02/21- Dr. Lynwood Nasuti   Hyperlipidemia    on meds   Hypertension    on meds   SVT (supraventricular tachycardia)     SURGICAL HISTORY: Past Surgical History:  Procedure Laterality Date   ABDOMINAL HYSTERECTOMY     AXILLARY SENTINEL NODE BIOPSY Left 07/09/2021   Procedure: LEFT AXILLARY SENTINEL NODE BIOPSY;  Surgeon: [name], MD;  Location: [facility];  Service: General;  Laterality: Left;   BALLOON DILATION N/A 12/13/2023   Procedure: MERRILL HODGKIN;  Surgeon: Federico Rosario BROCKS, MD;  Location: [facility];  Service: Gastroenterology;  Laterality: N/A;   BIOPSY  06/29/2022   Procedure: BIOPSY;  Surgeon: Legrand Victory LITTIE DOUGLAS, MD;  Location: [facility];   Service: Gastroenterology;;   BIOPSY OF SKIN SUBCUTANEOUS TISSUE AND/OR MUCOUS MEMBRANE  12/13/2023   Procedure: BIOPSY, SKIN, SUBCUTANEOUS TISSUE, OR MUCOUS MEMBRANE;  Surgeon: Federico Rosario BROCKS, MD;  Location: [facility] END[facility]COPY;  Service: Gastroenterology;;   BREAST EXCISIONAL BIOPSY Right 09/16/2009   BREAST LUMPECTOMY WITH RADIOACTIVE SEED LOCALIZATION Bilateral 07/09/2021   Procedure: BILATERAL BREAST LUMPECTOMY WITH RADIOACTIVE SEED LOCALIZATION;  Surgeon: [name], MD;  Location: [facility];  Service: General;  Laterality: Bilateral;   BREAST SURGERY     lumpectomy   ESOPHAGOGASTRODUODENOSCOPY N/A 12/13/2023   Procedure: EGD (ESOPHAGOGASTRODUODENOSCOPY);  Surgeon: Federico Rosario BROCKS, MD;  [facility];  Service: Gastroenterology;  Laterality: N/A;   ESOPHAGOGASTRODUODENOSCOPY N/A 12/26/2023   Procedure: EGD (ESOPHAGOGASTRODUODENOSCOPY);  Surgeon: Wilhelmenia Aloha Raddle., MD;  Location: [facility];  Service: Gastroenterology;  Laterality: N/A;   ESOPHAGOGASTRODUODENOSCOPY N/A 12/27/2023   Procedure: EGD (ESOPHAGOGASTRODUODENOSCOPY);  Surgeon: Wilhelmenia Aloha Raddle., MD;  Location: [facility];  Service: Gastroenterology;  Laterality: N/A;   ESOPHAGOGASTRODUODENOSCOPY N/A 03/08/2024   Procedure: EGD (ESOPHAGOGASTRODUODENOSCOPY);  Surgeon: Wilhelmenia Aloha Raddle., MD;  Location: [facility];  Service: Gastroenterology;  Laterality: N/A;   ESOPHAGOGASTRODUODENOSCOPY (EGD) WITH PROPOFOL  N/A 06/29/2022   Procedure: ESOPHAGOGASTRODUODENOSCOPY (EGD) WITH PROPOFOL ;  Surgeon: Legrand Victory LITTIE DOUGLAS, MD;  Location: [facility];  Service: Gastroenterology;  Laterality: N/A;   EUS N/A 12/26/2023   Procedure: ULTRASOUND, UPPER GI TRACT, ENDOSCOPIC;  Surgeon: Wilhelmenia Aloha Raddle., MD;  Location: [facility];  Service: Gastroenterology;  Laterality: N/A;   EUS N/A 12/27/2023   Procedure: ULTRASOUND, UPPER GI TRACT, ENDOSCOPIC;  Surgeon: Wilhelmenia Aloha Raddle., MD;  Location: [facility];  Service: Gastroenterology;  Laterality: N/A;   EUS N/A 03/08/2024   Procedure: ULTRASOUND, UPPER GI TRACT, ENDOSCOPIC;  Surgeon: Wilhelmenia Aloha Raddle., MD;  Location: [facility];  Service: Gastroenterology;  Laterality: N/A;   IR CM INJ ANY COLONIC TUBE W/FLUORO  01/20/2024   IR IMAGING GUIDED PORT INSERTION  02/06/2024   IR REPLC DUODEN/JEJUNO TUBE PERCUT W/FLUORO  02/03/2024   IR REPLC GASTRO/COLONIC TUBE PERCUT W/FLUORO  01/20/2024   LAPAROSCOPIC INSERTION GASTROSTOMY TUBE N/A 01/10/2024   Procedure: laparoscopic insertion of gastrostomy tube, laparoscopic insertion of jejunostomy tube, upper endoscopy;  Surgeon: Kinsinger, Herlene Righter, MD;  Location: [facility];  Service: General;  Laterality: N/A;  Laproscopic insertion   LAPAROSCOPIC ROUX-EN-Y GASTRIC BYPASS WITH HIATAL HERNIA REPAIR N/A 01/02/2024   Procedure: LAPAROSCOPIC CREATION GASTRJEJONOSTOMY;  Surgeon: Lyndel Deward PARAS, MD;  Location: [facility];  Service: General;  Laterality: N/A;  GASTROJEJUNOSTOMY   PORT-A-CATH REMOVAL Left 07/09/2021   Procedure: REMOVAL PORT-A-CATH;  Surgeon: [name], MD;  Location:  [facility];  Service: General;  Laterality: Left;   PORTACATH PLACEMENT Left 01/26/2021   Procedure: INSERTION PORT-A-CATH;  Surgeon: [name], MD;  Location: [facility];  Service: General;  Laterality: Left;   RADIOACTIVE SEED GUIDED AXILLARY SENTINEL LYMPH NODE Right 07/09/2021   Procedure: RADIOACTIVE SEED GUIDED RIGHT AXILLARY SENTINEL LYMPH NODE DISSECTION;  Surgeon: [name], MD;  Location: [facility];  Service: General;  Laterality: Right;   TUBAL LIGATION      SOCIAL HISTORY: Social History   Socioeconomic History   Marital status: Single    Spouse name: Not on file   Number of children: Not on file   Years of education: Not on file   Highest education level: Not on file  Occupational History   Not on file  Tobacco Use   Smoking status: Never   Smokeless  tobacco: Never  Vaping Use   Vaping status: Never Used  Substance and Sexual Activity   Alcohol use: Not Currently    Alcohol/week: 7.0 standard drinks of alcohol    Types: 7 Standard drinks or equivalent per week    Comment: occassional   Drug use: No   Sexual activity: Not Currently  Other Topics Concern   Not on file  Social History Narrative   Not on file   Social Drivers of Health   Financial Resource Strain: High Risk (02/13/2024)   Overall Financial Resource Strain (CARDIA)    Difficulty of Paying Living Expenses: Hard  Food Insecurity: No Food Insecurity (03/29/2024)   Hunger Vital Sign    Worried About Running Out of Food in the Last Year: Never true    Ran Out of Food in the Last Year: Never true  Recent Concern: Food Insecurity - Food Insecurity Present (02/16/2024)   Hunger Vital Sign    Worried About Running Out of Food in the Last Year: Sometimes true    Ran Out of Food in the Last Year: Sometimes true  Transportation Needs: No Transportation Needs (03/29/2024)   PRAPARE - Administrator, Civil Service (Medical): No    Lack of Transportation (Non-Medical): No  Physical Activity: Inactive (02/13/2024)   Exercise Vital Sign    Days of Exercise per Week: 0 days    Minutes of Exercise per Session: 0 min  Stress: No Stress Concern Present (02/13/2024)   [employer] of Occupational Health - Occupational Stress Questionnaire    Feeling of Stress: Not at all  Social Connections: Moderately Isolated (02/13/2024)   Social Connection and Isolation Panel    Frequency of Communication with Friends and Family: More than three times a week    Frequency of Social Gatherings with Friends and Family: Once a week    Attends Religious Services: 1 to 4 times per year    Active Member of [employer] or Organizations: No    Attends Banker Meetings: Never    Marital Status: Never married  Intimate Partner Violence: Not At Risk (03/29/2024)   Humiliation, Afraid,  Rape, and Kick questionnaire    Fear of Current or Ex-Partner: No    Emotionally Abused: No    Physically Abused: No    Sexually Abused: No    FAMILY HISTORY: Family History  Problem Relation Age of Onset   Breast cancer Mother        66s   Hypertension Father    Uterine cancer Maternal Aunt    Ovarian cancer Maternal Aunt    Breast cancer Maternal Aunt        78s  Breast cancer Cousin 16   Uterine cancer Cousin 50   Colon polyps Neg Hx    Colon cancer Neg Hx    Esophageal cancer Neg Hx    Stomach cancer Neg Hx     Review of Systems  Constitutional:  Positive for fatigue. Negative for appetite change, chills, fever and unexpected weight change.  HENT:   Negative for hearing loss, lump/mass and trouble swallowing.   Eyes:  Negative for eye problems and icterus.  Respiratory:  Negative for chest tightness, cough and shortness of breath.   Cardiovascular:  Negative for chest pain, leg swelling and palpitations.  Gastrointestinal:  Negative for abdominal distention, abdominal pain, constipation, diarrhea, nausea and vomiting.  Endocrine: Negative for hot flashes.  Genitourinary:  Negative for difficulty urinating.   Musculoskeletal:  Negative for arthralgias.  Skin:  Negative for itching and rash.  Neurological:  Negative for dizziness, extremity weakness, headaches and numbness.  Hematological:  Negative for adenopathy. Does not bruise/bleed easily.  Psychiatric/Behavioral:  Negative for depression. The patient is not nervous/anxious.       PHYSICAL EXAMINATION   Onc Performance Status - 03/30/24 1013       ECOG Perf Status   ECOG Perf Status Restricted in physically strenuous activity but ambulatory and able to carry out work of a light or sedentary nature, e.g., light house work, office work (P)       KPS SCALE   KPS % SCORE Able to carry on normal activity, minor s/s of disease (P)           Vitals:   03/30/24 1000  BP: 103/71  Pulse: 84  Resp: 16  Temp:  98 F (36.7 C)  SpO2: 99%    Physical Exam Constitutional:      General: She is not in acute distress.    Appearance: Normal appearance. She is ill-appearing (appears chronically ill). She is not toxic-appearing.  HENT:     Head: Normocephalic and atraumatic.     Mouth/Throat:     Mouth: Mucous membranes are moist.     Pharynx: Oropharynx is clear. No oropharyngeal exudate or posterior oropharyngeal erythema.  Eyes:     General: No scleral icterus. Cardiovascular:     Rate and Rhythm: Normal rate and regular rhythm.     Pulses: Normal pulses.     Heart sounds: Normal heart sounds.  Pulmonary:     Effort: Pulmonary effort is normal.     Breath sounds: Normal breath sounds.  Abdominal:     General: Abdomen is flat. Bowel sounds are normal. There is no distension.     Palpations: Abdomen is soft.     Tenderness: There is no abdominal tenderness.  Musculoskeletal:        General: No swelling.     Cervical back: Neck supple.  Lymphadenopathy:     Cervical: No cervical adenopathy.  Skin:    General: Skin is warm and dry.     Findings: No rash.  Neurological:     General: No focal deficit present.     Mental Status: She is alert.  Psychiatric:        Mood and Affect: Mood normal.        Behavior: Behavior normal.     LABORATORY DATA:  CBC    Component Value Date/Time   WBC 3.9 (L) 03/30/2024 0914   WBC 3.1 (L) 03/28/2024 0507   RBC 3.76 (L) 03/30/2024 0914   HGB 10.0 (L) 03/30/2024 0914   HGB 11.5  10/06/2020 1432   HCT 29.1 (L) 03/30/2024 0914   HCT 36.8 10/06/2020 1432   PLT 146 (L) 03/30/2024 0914   PLT 323 10/06/2020 1432   MCV 77.4 (L) 03/30/2024 0914   MCV 73 (L) 10/06/2020 1432   MCH 26.6 03/30/2024 0914   MCHC 34.4 03/30/2024 0914   RDW 16.3 (H) 03/30/2024 0914   RDW 15.9 (H) 10/06/2020 1432   LYMPHSABS 1.2 03/30/2024 0914   LYMPHSABS 1.9 10/06/2020 1432   MONOABS 0.5 03/30/2024 0914   EOSABS 0.1 03/30/2024 0914   EOSABS 0.1 10/06/2020 1432    BASOSABS 0.0 03/30/2024 0914   BASOSABS 0.0 10/06/2020 1432    CMP     Component Value Date/Time   NA 137 03/30/2024 0914   NA 144 11/30/2023 1000   K 3.2 (L) 03/30/2024 0914   CL 109 03/30/2024 0914   CO2 22 03/30/2024 0914   GLUCOSE 96 03/30/2024 0914   BUN <5 (L) 03/30/2024 0914   BUN 19 11/30/2023 1000   CREATININE 0.32 (L) 03/30/2024 0914   CALCIUM  6.0 (LL) 03/30/2024 0914   PROT 5.9 (L) 03/30/2024 0914   PROT 7.3 11/30/2023 1000   ALBUMIN  3.2 (L) 03/30/2024 0914   ALBUMIN  4.5 11/30/2023 1000   AST 14 (L) 03/30/2024 0914   ALT 22 03/30/2024 0914   ALKPHOS 893 (H) 03/30/2024 0914   BILITOT 0.5 03/30/2024 0914   GFRNONAA >60 03/30/2024 0914   GFRAA >60 09/07/2019 1340     ASSESSMENT and THERAPY PLAN:   Malignant neoplasm of upper-outer quadrant of right breast in female, estrogen receptor positive (HCC) 12/03/2020: Bilateral breast biopsies: Right breast: T2N1 stage IIa grade 2 ILC, ER/PR positive, HER2 negative, Ki-67 40%, right axillary lymph node positive with extracapsular extension MammaPrint: High risk   01/27/2021 -06/08/2021 neoadjuvant chemotherapy with dose dense Adriamycin  and Cytoxan  x4 followed by Taxol  x11   07/09/2021:Right lumpectomy: Grade 2 invasive lobular cancer, 2.5 cm, LCIS, margins negative, LCIS focally at anterior margin, 1/1 lymph node positive, ER 95%, PR 100%, HER2 negative, Ki-67 40% 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  Hospitalization: 12/22/2023-01/17/2024: Intractable nausea vomiting (PEG tube: Venting gastrostomy, J-tube) Hospitalization 02/01/2024-02/06/2024: Intractable pain, nausea vomiting ---------------------------------------------------------------------------------------------------------------------- 06/10/2023: PET/CT:  Interval development of hypermetabolic lymphadenopathy left submandibular region, throughout mediastinal, bilateral hilar, upper abdomen (no uptake in the bones)  02/02/2024: CT abdomen and pelvis: Pancreatic mass, right hydronephrosis, peritoneal carcinomatosis, bone metastases L3 Peritoneal biopsy 02/03/2024: Metastatic breast cancer, ER 100%, PR 0%, HER2 2+   Treatment plan: Enhertu  every 3 weeks started 02/16/2024 Hospitalization 03/04/2024-03/09/2024: Abdominal wall abscess 03/05/2024: CT abdomen and pelvis: Improved abscess 1.8 cm, dilation of extrahepatic bile duct, omental infiltration with peritoneal carcinomatosis, diffuse bone metastases  Assessment and Plan Assessment & Plan Metastatic breast cancer Returning  next week for next cycle of Enhertu   Neoplasm-related abdominal pain S/p celiac plexus block, on fentanyl  patch. - Continue fentanyl  patch. - Ensure oxycodone  prescription is filled for use as needed.  Hypocalcemia Persistent hypocalcemia with calcium  levels dropping from 8 to 6, increasing rehospitalization risk. Emphasized importance of calcium  supplementation and challenges in obtaining correct dosage. Patient asymptomatic related to hypocalcemia.   - 2g IV calcium  gluconate today in infusion - Called in Calcitriol  0.25 mcg PO BID to [facility] pharmacy - Called in Calcium  Carbonate 600mg  TID to [facility] pharmacy -  EKG demonstrates no significant change in QT interval  Hypomagnesimia Magnesium  level 1.4.   - Continue Magnesium  BID OTC - 2 g IV Magnesium  Sulfate today  Hypokalemia Chronically slightly low, unable to tolerate oral supplementation.  - KCl 20meq IV today  Diarrhea Discussed impact on calcium  loss and importance of hydration. - Advise on hydration  RTC on Monday for labs, f/u, and IV fluids.    All questions were answered. The patient knows to call the clinic with any problems, questions or concerns. We can certainly see the patient much sooner if  necessary.  Total encounter time:60 minutes*in face-to-face visit time, chart review, lab review, care coordination, order entry, and documentation of the encounter time.    Morna Kendall, NP 03/30/24 2:10 PM Medical Oncology and Hematology [facility] [address] [place], [place] [address] Tel. [phone]    Fax. [phone]  *Total Encounter Time as defined by the Centers for Medicare and Medicaid Services includes, in addition to the face-to-face time of a patient visit (documented in the note above) non-face-to-face time: obtaining and reviewing outside history, ordering and reviewing medications, tests or procedures, care coordination (communications with other health care professionals or caregivers) and documentation in the medical record.

## 2024-03-31 ENCOUNTER — Ambulatory Visit: Payer: MEDICAID

## 2024-04-01 ENCOUNTER — Other Ambulatory Visit: Payer: Self-pay

## 2024-04-01 ENCOUNTER — Observation Stay (HOSPITAL_COMMUNITY): Payer: MEDICAID

## 2024-04-01 ENCOUNTER — Inpatient Hospital Stay (HOSPITAL_COMMUNITY)
Admission: EM | Admit: 2024-04-01 | Discharge: 2024-04-19 | DRG: 374 | Disposition: A | Discharge status: Hospice | Payer: MEDICAID | Attending: Internal Medicine | Admitting: Internal Medicine

## 2024-04-01 ENCOUNTER — Emergency Department (HOSPITAL_COMMUNITY): Payer: MEDICAID

## 2024-04-01 DIAGNOSIS — Z59868 Other specified financial insecurity: Secondary | ICD-10-CM

## 2024-04-01 DIAGNOSIS — Z8049 Family history of malignant neoplasm of other genital organs: Secondary | ICD-10-CM

## 2024-04-01 DIAGNOSIS — R6521 Severe sepsis with septic shock: Secondary | ICD-10-CM | POA: Diagnosis present

## 2024-04-01 DIAGNOSIS — F32A Depression, unspecified: Secondary | ICD-10-CM | POA: Diagnosis present

## 2024-04-01 DIAGNOSIS — G929 Unspecified toxic encephalopathy: Secondary | ICD-10-CM | POA: Diagnosis not present

## 2024-04-01 DIAGNOSIS — K9423 Gastrostomy malfunction: Secondary | ICD-10-CM | POA: Diagnosis present

## 2024-04-01 DIAGNOSIS — D630 Anemia in neoplastic disease: Secondary | ICD-10-CM | POA: Diagnosis present

## 2024-04-01 DIAGNOSIS — Z66 Do not resuscitate: Secondary | ICD-10-CM | POA: Diagnosis not present

## 2024-04-01 DIAGNOSIS — Z90710 Acquired absence of both cervix and uterus: Secondary | ICD-10-CM

## 2024-04-01 DIAGNOSIS — I471 Supraventricular tachycardia, unspecified: Secondary | ICD-10-CM | POA: Diagnosis present

## 2024-04-01 DIAGNOSIS — R1084 Generalized abdominal pain: Secondary | ICD-10-CM | POA: Diagnosis present

## 2024-04-01 DIAGNOSIS — I129 Hypertensive chronic kidney disease with stage 1 through stage 4 chronic kidney disease, or unspecified chronic kidney disease: Secondary | ICD-10-CM | POA: Diagnosis present

## 2024-04-01 DIAGNOSIS — R4589 Other symptoms and signs involving emotional state: Secondary | ICD-10-CM

## 2024-04-01 DIAGNOSIS — R1110 Vomiting, unspecified: Secondary | ICD-10-CM | POA: Insufficient documentation

## 2024-04-01 DIAGNOSIS — Z9884 Bariatric surgery status: Secondary | ICD-10-CM

## 2024-04-01 DIAGNOSIS — Z803 Family history of malignant neoplasm of breast: Secondary | ICD-10-CM

## 2024-04-01 DIAGNOSIS — C786 Secondary malignant neoplasm of retroperitoneum and peritoneum: Principal | ICD-10-CM | POA: Diagnosis present

## 2024-04-01 DIAGNOSIS — W19XXXA Unspecified fall, initial encounter: Secondary | ICD-10-CM

## 2024-04-01 DIAGNOSIS — E878 Other disorders of electrolyte and fluid balance, not elsewhere classified: Secondary | ICD-10-CM | POA: Diagnosis not present

## 2024-04-01 DIAGNOSIS — R7881 Bacteremia: Secondary | ICD-10-CM

## 2024-04-01 DIAGNOSIS — C784 Secondary malignant neoplasm of small intestine: Secondary | ICD-10-CM | POA: Diagnosis present

## 2024-04-01 DIAGNOSIS — L02211 Cutaneous abscess of abdominal wall: Secondary | ICD-10-CM | POA: Diagnosis present

## 2024-04-01 DIAGNOSIS — C7989 Secondary malignant neoplasm of other specified sites: Secondary | ICD-10-CM | POA: Diagnosis present

## 2024-04-01 DIAGNOSIS — Z8249 Family history of ischemic heart disease and other diseases of the circulatory system: Secondary | ICD-10-CM

## 2024-04-01 DIAGNOSIS — N1831 Chronic kidney disease, stage 3a: Secondary | ICD-10-CM | POA: Diagnosis present

## 2024-04-01 DIAGNOSIS — Z1721 Progesterone receptor positive status: Secondary | ICD-10-CM

## 2024-04-01 DIAGNOSIS — C50911 Malignant neoplasm of unspecified site of right female breast: Secondary | ICD-10-CM | POA: Diagnosis present

## 2024-04-01 DIAGNOSIS — Z9181 History of falling: Secondary | ICD-10-CM

## 2024-04-01 DIAGNOSIS — Z888 Allergy status to other drugs, medicaments and biological substances status: Secondary | ICD-10-CM

## 2024-04-01 DIAGNOSIS — Z6823 Body mass index (BMI) 23.0-23.9, adult: Secondary | ICD-10-CM

## 2024-04-01 DIAGNOSIS — J45909 Unspecified asthma, uncomplicated: Secondary | ICD-10-CM | POA: Diagnosis present

## 2024-04-01 DIAGNOSIS — Z515 Encounter for palliative care: Secondary | ICD-10-CM

## 2024-04-01 DIAGNOSIS — K5730 Diverticulosis of large intestine without perforation or abscess without bleeding: Secondary | ICD-10-CM | POA: Diagnosis present

## 2024-04-01 DIAGNOSIS — Z923 Personal history of irradiation: Secondary | ICD-10-CM

## 2024-04-01 DIAGNOSIS — K2100 Gastro-esophageal reflux disease with esophagitis, without bleeding: Secondary | ICD-10-CM | POA: Diagnosis present

## 2024-04-01 DIAGNOSIS — F419 Anxiety disorder, unspecified: Secondary | ICD-10-CM | POA: Diagnosis present

## 2024-04-01 DIAGNOSIS — E785 Hyperlipidemia, unspecified: Secondary | ICD-10-CM | POA: Diagnosis present

## 2024-04-01 DIAGNOSIS — K9401 Colostomy hemorrhage: Secondary | ICD-10-CM | POA: Diagnosis present

## 2024-04-01 DIAGNOSIS — E876 Hypokalemia: Secondary | ICD-10-CM | POA: Diagnosis present

## 2024-04-01 DIAGNOSIS — D649 Anemia, unspecified: Secondary | ICD-10-CM | POA: Insufficient documentation

## 2024-04-01 DIAGNOSIS — K529 Noninfective gastroenteritis and colitis, unspecified: Secondary | ICD-10-CM | POA: Diagnosis present

## 2024-04-01 DIAGNOSIS — E871 Hypo-osmolality and hyponatremia: Secondary | ICD-10-CM | POA: Diagnosis present

## 2024-04-01 DIAGNOSIS — C7951 Secondary malignant neoplasm of bone: Secondary | ICD-10-CM | POA: Diagnosis present

## 2024-04-01 DIAGNOSIS — K449 Diaphragmatic hernia without obstruction or gangrene: Secondary | ICD-10-CM | POA: Diagnosis present

## 2024-04-01 DIAGNOSIS — B961 Klebsiella pneumoniae [K. pneumoniae] as the cause of diseases classified elsewhere: Secondary | ICD-10-CM | POA: Diagnosis present

## 2024-04-01 DIAGNOSIS — K315 Obstruction of duodenum: Secondary | ICD-10-CM | POA: Diagnosis present

## 2024-04-01 DIAGNOSIS — R197 Diarrhea, unspecified: Secondary | ICD-10-CM | POA: Insufficient documentation

## 2024-04-01 DIAGNOSIS — Z1732 Human epidermal growth factor receptor 2 negative status: Secondary | ICD-10-CM

## 2024-04-01 DIAGNOSIS — C7889 Secondary malignant neoplasm of other digestive organs: Secondary | ICD-10-CM

## 2024-04-01 DIAGNOSIS — A419 Sepsis, unspecified organism: Secondary | ICD-10-CM

## 2024-04-01 DIAGNOSIS — K831 Obstruction of bile duct: Secondary | ICD-10-CM | POA: Diagnosis present

## 2024-04-01 DIAGNOSIS — G893 Neoplasm related pain (acute) (chronic): Secondary | ICD-10-CM | POA: Diagnosis present

## 2024-04-01 DIAGNOSIS — R262 Difficulty in walking, not elsewhere classified: Secondary | ICD-10-CM | POA: Diagnosis present

## 2024-04-01 DIAGNOSIS — Z7189 Other specified counseling: Secondary | ICD-10-CM

## 2024-04-01 DIAGNOSIS — M25561 Pain in right knee: Secondary | ICD-10-CM | POA: Diagnosis present

## 2024-04-01 DIAGNOSIS — A4159 Other Gram-negative sepsis: Secondary | ICD-10-CM | POA: Diagnosis present

## 2024-04-01 DIAGNOSIS — E872 Acidosis, unspecified: Secondary | ICD-10-CM | POA: Diagnosis not present

## 2024-04-01 DIAGNOSIS — K632 Fistula of intestine: Secondary | ICD-10-CM | POA: Diagnosis present

## 2024-04-01 DIAGNOSIS — Z79899 Other long term (current) drug therapy: Secondary | ICD-10-CM

## 2024-04-01 DIAGNOSIS — E440 Moderate protein-calorie malnutrition: Secondary | ICD-10-CM | POA: Diagnosis present

## 2024-04-01 DIAGNOSIS — Z170 Estrogen receptor positive status [ER+]: Secondary | ICD-10-CM

## 2024-04-01 DIAGNOSIS — Z79891 Long term (current) use of opiate analgesic: Secondary | ICD-10-CM

## 2024-04-01 DIAGNOSIS — K869 Disease of pancreas, unspecified: Secondary | ICD-10-CM | POA: Diagnosis present

## 2024-04-01 DIAGNOSIS — Z8041 Family history of malignant neoplasm of ovary: Secondary | ICD-10-CM

## 2024-04-01 DIAGNOSIS — Z86718 Personal history of other venous thrombosis and embolism: Secondary | ICD-10-CM

## 2024-04-01 HISTORY — DX: Malignant neoplasm of unspecified site of right female breast: C50.911

## 2024-04-01 HISTORY — DX: Obstruction of duodenum: K31.5

## 2024-04-01 HISTORY — DX: Gastrostomy status: Z93.1

## 2024-04-01 HISTORY — DX: Secondary malignant neoplasm of retroperitoneum and peritoneum: C78.6

## 2024-04-01 HISTORY — DX: Neoplasm related pain (acute) (chronic): G89.3

## 2024-04-01 LAB — COMPREHENSIVE METABOLIC PANEL WITH GFR
ALT: 23 U/L (ref 0–44)
AST: 22 U/L (ref 15–41)
Albumin: 3.3 g/dL — ABNORMAL LOW (ref 3.5–5.0)
Alkaline Phosphatase: 809 U/L — ABNORMAL HIGH (ref 38–126)
Anion gap: 11 (ref 5–15)
BUN: <5 mg/dL — ABNORMAL LOW (ref 6–20)
CO2: 20 mmol/L — ABNORMAL LOW (ref 22–32)
Calcium: 5.4 mg/dL — CL (ref 8.9–10.3)
Chloride: 106 mmol/L (ref 98–111)
Creatinine, Ser: 0.31 mg/dL — ABNORMAL LOW (ref 0.44–1.00)
GFR, Estimated: >60 mL/min (ref >60)
Glucose, Bld: 96 mg/dL (ref 70–99)
Potassium: 3.2 mmol/L — ABNORMAL LOW (ref 3.5–5.1)
Sodium: 138 mmol/L (ref 135–145)
Total Bilirubin: 0.4 mg/dL (ref 0.0–1.2)
Total Protein: 5.7 g/dL — ABNORMAL LOW (ref 6.5–8.1)

## 2024-04-01 LAB — CBC WITH DIFFERENTIAL/PLATELET
Abs Immature Granulocytes: 0.07 K/uL (ref 0.00–0.07)
Basophils Absolute: 0 K/uL (ref 0.0–0.1)
Basophils Relative: 1 %
Eosinophils Absolute: 0 K/uL (ref 0.0–0.5)
Eosinophils Relative: 1 %
HCT: 28.9 % — ABNORMAL LOW (ref 36.0–46.0)
Hemoglobin: 9.3 g/dL — ABNORMAL LOW (ref 12.0–15.0)
Immature Granulocytes: 2 %
Lymphocytes Relative: 36 %
Lymphs Abs: 1.2 K/uL (ref 0.7–4.0)
MCH: 26.1 pg (ref 26.0–34.0)
MCHC: 32.2 g/dL (ref 30.0–36.0)
MCV: 81 fL (ref 80.0–100.0)
Monocytes Absolute: 0.4 K/uL (ref 0.1–1.0)
Monocytes Relative: 11 %
Neutro Abs: 1.7 K/uL (ref 1.7–7.7)
Neutrophils Relative %: 49 %
Platelets: 159 K/uL (ref 150–400)
RBC: 3.57 MIL/uL — ABNORMAL LOW (ref 3.87–5.11)
RDW: 16.8 % — ABNORMAL HIGH (ref 11.5–15.5)
WBC: 3.4 K/uL — ABNORMAL LOW (ref 4.0–10.5)
nRBC: 0 % (ref 0.0–0.2)

## 2024-04-01 LAB — LIPASE, BLOOD: Lipase: 24 U/L (ref 11–51)

## 2024-04-01 LAB — MAGNESIUM: Magnesium: 1.6 mg/dL — ABNORMAL LOW (ref 1.7–2.4)

## 2024-04-01 MED ORDER — CHLORHEXIDINE GLUCONATE CLOTH 2 % EX PADS
6.0000 | MEDICATED_PAD | Freq: Every day | CUTANEOUS | Status: DC
  Administered 2024-04-02 – 2024-04-19 (×17): 6 via TOPICAL

## 2024-04-01 MED ORDER — IOHEXOL 300 MG/ML  SOLN
100.0000 mL | Freq: Once | INTRAMUSCULAR | Status: AC | PRN
Start: 2024-04-01 — End: 2024-04-01
  Administered 2024-04-01: 100 mL via INTRAVENOUS

## 2024-04-01 MED ORDER — SODIUM CHLORIDE 0.9% FLUSH: 10.0000 mL | INTRAVENOUS | Status: DC | PRN

## 2024-04-01 MED ORDER — SODIUM CHLORIDE 0.9 % IV SOLN: INTRAVENOUS | Status: AC

## 2024-04-01 MED ORDER — PROCHLORPERAZINE EDISYLATE 10 MG/2ML IJ SOLN
10.0000 mg | Freq: Once | INTRAMUSCULAR | Status: AC
  Administered 2024-04-01: 10 mg via INTRAVENOUS
  Filled 2024-04-01: qty 2

## 2024-04-01 MED ORDER — METOCLOPRAMIDE HCL 5 MG/ML IJ SOLN
10.0000 mg | Freq: Three times a day (TID) | INTRAMUSCULAR | Status: DC | PRN
Start: 2024-04-01 — End: 2024-04-02

## 2024-04-01 MED ORDER — SODIUM CHLORIDE 0.9% FLUSH
10.0000 mL | Freq: Two times a day (BID) | INTRAVENOUS | Status: DC
  Administered 2024-04-02 – 2024-04-03 (×3): 10 mL
  Administered 2024-04-03: 20 mL
  Administered 2024-04-04 (×2): 10 mL
  Administered 2024-04-05: 20 mL
  Administered 2024-04-05 – 2024-04-09 (×8): 10 mL
  Administered 2024-04-09: 40 mL
  Administered 2024-04-10 – 2024-04-11 (×3): 10 mL
  Administered 2024-04-11: 20 mL
  Administered 2024-04-12: 30 mL
  Administered 2024-04-12: 40 mL
  Administered 2024-04-13: 20 mL
  Administered 2024-04-13 – 2024-04-14 (×2): 10 mL
  Administered 2024-04-14 – 2024-04-15 (×2): 20 mL
  Administered 2024-04-15 – 2024-04-17 (×4): 10 mL
  Administered 2024-04-18: 30 mL
  Administered 2024-04-18: 10 mL

## 2024-04-01 MED ORDER — POTASSIUM CHLORIDE 10 MEQ/100ML IV SOLN
10.0000 meq | INTRAVENOUS | Status: AC
  Administered 2024-04-01 – 2024-04-02 (×4): 10 meq via INTRAVENOUS
  Filled 2024-04-01 (×4): qty 100

## 2024-04-01 MED ORDER — CALCIUM CARB-CHOLECALCIFEROL 600-10 MG-MCG PO TABS: 1.0000 | ORAL_TABLET | Freq: Three times a day (TID) | ORAL | Status: DC

## 2024-04-01 MED ORDER — FENTANYL 25 MCG/HR TD PT72
1.0000 | MEDICATED_PATCH | TRANSDERMAL | Status: DC
  Administered 2024-04-02 – 2024-04-05 (×2): 1 via TRANSDERMAL
  Filled 2024-04-01 (×2): qty 1

## 2024-04-01 MED ORDER — MAGNESIUM OXIDE -MG SUPPLEMENT 400 (240 MG) MG PO TABS: 400.0000 mg | ORAL_TABLET | Freq: Two times a day (BID) | ORAL | Status: DC

## 2024-04-01 MED ORDER — CALCIUM CARBONATE 1250 (500 CA) MG PO TABS
1.0000 | ORAL_TABLET | Freq: Two times a day (BID) | ORAL | Status: DC
  Administered 2024-04-03: 1250 mg via ORAL
  Filled 2024-04-01 (×2): qty 1

## 2024-04-01 MED ORDER — CALCIUM GLUCONATE-NACL 1-0.675 GM/50ML-% IV SOLN
1.0000 g | Freq: Once | INTRAVENOUS | Status: AC
Start: 2024-04-01 — End: 2024-04-01
  Administered 2024-04-01: 1000 mg via INTRAVENOUS
  Filled 2024-04-01: qty 50

## 2024-04-01 MED ORDER — NALOXONE HCL 0.4 MG/ML IJ SOLN: 0.4000 mg | INTRAMUSCULAR | Status: DC | PRN

## 2024-04-01 MED ORDER — CALCITRIOL 0.25 MCG PO CAPS
0.2500 ug | ORAL_CAPSULE | Freq: Two times a day (BID) | ORAL | Status: DC
  Administered 2024-04-02: 0.25 ug via ORAL
  Filled 2024-04-01 (×4): qty 1

## 2024-04-01 MED ORDER — METOCLOPRAMIDE HCL 5 MG/ML IJ SOLN
10.0000 mg | Freq: Once | INTRAMUSCULAR | Status: AC
  Administered 2024-04-01: 10 mg via INTRAVENOUS
  Filled 2024-04-01: qty 2

## 2024-04-01 MED ORDER — OXYCODONE HCL 5 MG PO TABS
15.0000 mg | ORAL_TABLET | ORAL | Status: DC | PRN
  Administered 2024-04-02: 15 mg via ORAL
  Filled 2024-04-01: qty 3

## 2024-04-01 MED ORDER — HYDROMORPHONE HCL 1 MG/ML IJ SOLN
0.5000 mg | Freq: Once | INTRAMUSCULAR | Status: AC
  Administered 2024-04-01: 0.5 mg via INTRAVENOUS
  Filled 2024-04-01: qty 1

## 2024-04-01 MED ORDER — SODIUM CHLORIDE 0.9 % IV BOLUS
1000.0000 mL | Freq: Once | INTRAVENOUS | Status: AC
  Administered 2024-04-01: 1000 mL via INTRAVENOUS

## 2024-04-01 MED ORDER — HYDROMORPHONE HCL 1 MG/ML IJ SOLN
1.0000 mg | Freq: Once | INTRAMUSCULAR | Status: AC
  Administered 2024-04-01: 1 mg via INTRAVENOUS
  Filled 2024-04-01: qty 1

## 2024-04-01 NOTE — ED Provider Notes (Signed)
 [facility] AT [facility] Provider Note   CSN: [id number] Arrival date & time: 04/01/24  1738     Patient presents with: Abdominal Pain   Chelsea Hansen is a 53 y.o. female.  {Add pertinent medical, surgical, social history, OB history to YEP:67052} Patient with metastatic breast cancer.  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  She presents with vomiting.  Patient recently was in the hospital with electrolyte abnormalities and vomiting   Abdominal Pain      Prior to Admission medications   Medication Sig Start Date End Date Taking? Authorizing Provider  acetaminophen  (TYLENOL ) 325 MG tablet Take 2 tablets (650 mg total) by mouth every 6 (six) hours as needed for mild pain (pain score 1-3) or fever (or Fever >/= 101). Patient not taking: Reported on 03/29/2024 03/09/24   Rosario Eland I, MD  calcitRIOL  (ROCALTROL ) 0.25 MCG capsule Take 1 capsule (0.25 mcg total) by mouth 2 (two) times daily. 03/30/24   [name], NP  Calcium  Carb-Cholecalciferol  (CALCIUM  600+D) 600-10 MG-MCG TABS Take 1 tablet by mouth 3 (three) times daily with meals. 03/30/24   [name], NP  fentaNYL  (DURAGESIC ) 25 MCG/HR Place 1 patch onto the skin every 3 (three) days. 03/11/24   Rosario Eland FERNS, MD  lidocaine -prilocaine  (EMLA ) cream Apply to affected area once Patient taking differently: Apply 1 Application topically daily as needed (for port). 02/08/24   [name], [name], MD  magnesium  oxide (MAG-OX) 400 (240 Mg) MG tablet Take 1 tablet (400 mg total) by mouth 2 (two) times daily. 03/28/24 06/26/24  [place], Camellia PARAS, DO  ondansetron  (ZOFRAN ) 8 MG tablet Take 1 tablet (8 mg total) by mouth every 8 (eight) hours as needed for nausea or vomiting. Start on the third day after chemotherapy. 02/08/24   [name], MD  oxyCODONE  (ROXICODONE ) 15 MG immediate release tablet Take 1 tablet (15 mg total) by mouth every 4 (four) hours as needed  for pain. 03/29/24   [name], Athena N, NP  prochlorperazine  (COMPAZINE ) 10 MG tablet Take 1 tablet (10 mg total) by mouth every 6 (six) hours as needed for nausea or vomiting. 02/08/24   [name], MD    Allergies: Enhertu  [fam-trastuzumab  [id number]]    Review of Systems  Gastrointestinal:  Positive for abdominal pain.    Updated Vital Signs BP (!) 123/90 (BP Location: Right Arm)   Pulse 69   Temp 98.6 F (37 C) (Oral)   Resp 18   SpO2 100%   Physical Exam  (all labs ordered are listed, but only abnormal results are displayed) Labs Reviewed  CBC WITH DIFFERENTIAL/PLATELET - Abnormal; Notable for the following components:      Result Value   WBC 3.4 (*)    RBC 3.57 (*)    Hemoglobin 9.3 (*)    HCT 28.9 (*)    RDW 16.8 (*)    All other components within normal limits  COMPREHENSIVE METABOLIC PANEL WITH GFR - Abnormal; Notable for the following components:   Potassium 3.2 (*)    CO2 20 (*)    BUN <5 (*)    Creatinine, Ser 0.31 (*)    Calcium  5.4 (*)    Total Protein 5.7 (*)    Albumin  3.3 (*)    Alkaline Phosphatase 809 (*)    All other components within normal limits  LIPASE, BLOOD  MAGNESIUM     EKG: None  Radiology: CT ABDOMEN PELVIS W CONTRAST Result Date: 04/01/2024 CLINICAL DATA:  Abdominal pain EXAM: CT ABDOMEN AND PELVIS WITH CONTRAST TECHNIQUE: Multidetector CT imaging of the abdomen and pelvis was performed using the standard protocol following bolus administration of intravenous contrast. RADIATION DOSE REDUCTION: This exam was performed according to the departmental dose-optimization program which includes automated exposure control, adjustment of the mA and/or kV according to patient size and/or use of iterative reconstruction technique. CONTRAST:  OMNIPAQUE  IOHEXOL  300 MG/ML  SOLN COMPARISON:  03/05/2024 FINDINGS: Lower chest: Small right pleural effusion. Hepatobiliary: No focal hepatic abnormality. Gallbladder unremarkable.  Pancreas: No focal abnormality or ductal dilatation. Spleen: No focal abnormality.  Normal size. Adrenals/Urinary Tract: No suspicious renal or adrenal abnormality. No stones or hydronephrosis. Urinary bladder unremarkable. Stomach/Bowel: Colonic diverticulosis. No active diverticulitis. Gastrostomy tube within the stomach. Small bowel decompressed. No bowel obstruction or inflammatory process. Vascular/Lymphatic: No evidence of aneurysm or adenopathy. Reproductive: Prior hysterectomy.  No adnexal masses. Other: No free fluid or free air. Omental infiltration again noted anteriorly, stable since prior study compatible with peritoneal carcinomatosis. Left catheter tract again noted in the left abdominal wall with small gas and fluid collection in the region of the left oblique muscles, now measuring approximately 1.6 x 1 cm compared to 2.2 by 0.5 cm previously. Musculoskeletal: Diffuse mixed lytic and sclerotic metastatic disease throughout the osseous structures. This is unchanged. IMPRESSION: Small right pleural effusion. Stable omental infiltration compatible with peritoneal carcinomatosis. Further decreased size of the small fluid collection in the left abdominal wall in the area of the left oblique muscles which appears to communicate with the catheter tract in the left abdominal wall. Colonic diverticulosis. Electronically Signed   By: Franky Crease M.D.   On: 04/01/2024 20:45    {Document cardiac monitor, telemetry assessment procedure when appropriate:32947} Procedures   Medications Ordered in the ED  sodium chloride  0.9 % bolus 1,000 mL (0 mLs Intravenous Stopped 04/01/24 2114)  metoCLOPramide  (REGLAN ) injection 10 mg (10 mg Intravenous Given 04/01/24 1845)  HYDROmorphone  (DILAUDID ) injection 0.5 mg (0.5 mg Intravenous Given 04/01/24 1844)  calcium  gluconate 1 g/ 50 mL sodium chloride  IVPB (0 mg Intravenous Stopped 04/01/24 2115)  iohexol  (OMNIPAQUE ) 300 MG/ML solution 100 mL (100 mLs Intravenous  Contrast Given 04/01/24 2015)  HYDROmorphone  (DILAUDID ) injection 1 mg (1 mg Intravenous Given 04/01/24 2113)  prochlorperazine  (COMPAZINE ) injection 10 mg (10 mg Intravenous Given 04/01/24 2114)      {Click here for ABCD2, HEART and other calculators REFRESH Note before signing:1}                              Medical Decision Making Amount and/or Complexity of Data Reviewed Labs: ordered. Radiology: ordered.  Risk Prescription drug management. Decision regarding hospitalization.   Patient with persistent vomiting and metastatic breast cancer with hypocalcemia.  She was admitted to medicine  {Document critical care time when appropriate  Document review of labs and clinical decision tools ie [id number], etc  Document your independent review of radiology images and any outside records  Document your discussion with family members, caretakers and with consultants  Document social determinants of health affecting pt's care  Document your decision making why or why not admission, treatments were needed:32947:::1}   Final diagnoses:  Hypocalcemia    ED Discharge Orders     None

## 2024-04-01 NOTE — H&P (Signed)
 History and Physical    [name] FMW:[id number] DOB: Jun 13, 1971 DOA: 04/01/2024  PCP: [name], NP  Patient coming from: Home  Chief Complaint: Abdominal pain, nausea, vomiting  HPI: Chelsea Hansen is a 53 y.o. 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  Admitted to the hospital 9/29-10/01/2024 for nausea, vomiting, diarrhea, and severe electrolyte derangements.  Patient presents to the ED today for evaluation of generalized abdominal pain, nausea, vomiting, and diarrhea for the past few days.  Also reporting ongoing leakage around her G-tube insertion site.  Patient states she has not used her G-tube since it was placed in July.  Denies fevers.  Patient states she was going downhill yesterday and fell forward on her right knee and hit her head on the floor.  She is reporting severe pain in her right knee which is making it difficult for her to walk.  ED Course: Vital signs stable.  Labs notable for WBC count 3.4, hemoglobin 9.3 (stable compared to recent labs), platelet count 159k (improved), potassium 3.2, bicarb 20, glucose 96, BUN <5, creatinine 0.3, calcium  5.4, albumin  3.3, total protein 5.7, alk phos 809 (elevated on previous labs as well), transaminases and T. bili normal, lipase normal, magnesium  level pending.   CT abdomen pelvis showing: IMPRESSION: Small right pleural effusion.   Stable omental infiltration compatible with peritoneal carcinomatosis.   Further decreased size of the small fluid collection in the left abdominal wall in the area of the left oblique muscles which appears to communicate with the catheter tract in the left abdominal wall.   Colonic diverticulosis.   Patient  was given Dilaudid , Reglan , Compazine , IV calcium  gluconate, and 1 L normal saline.  Review of Systems:  Review of Systems  All other systems reviewed and are negative.   Past Medical History:  Diagnosis Date   Allergy    seasonal allergies   Anxiety    on meds   Asthma    uses inhaler   Breast cancer (HCC) 2012   RIGHT lumpectomy   Cancer (HCC) 2022   RIGHT breast lump-dx 2022   Depression    on meds   DVT (deep venous thrombosis) (HCC) 2010   after hysterectomy   Family history of uterine cancer    GERD (gastroesophageal reflux disease)    with certain foods/OTC PRN meds   Headache(784.0)    History of radiation therapy    Bilateral breast- 09/03/21-11/02/21- Dr. Lynwood Nasuti   Hyperlipidemia    on meds   Hypertension    on meds   SVT (supraventricular tachycardia)     Past Surgical History:  Procedure Laterality Date   ABDOMINAL HYSTERECTOMY     AXILLARY SENTINEL NODE BIOPSY Left 07/09/2021   Procedure: LEFT AXILLARY SENTINEL NODE BIOPSY;  Surgeon: [name], MD;  Location:  SUR[facility]ERY CENTER;  Service: General;  Laterality: Left;   BALLOON DILATION N/A 12/13/2023   Procedure: MERRILL HODGKIN;  Surgeon: Federico Rosario BROCKS, MD;  Location: [facility];  Service: Gastroenterology;  Laterality: N/A;   BIOPSY  06/29/2022   Procedure: BIOPSY;  Surgeon: Legrand Victory LITTIE DOUGLAS, MD;  Location: [facility];  Service: Gastroenterology;;   BIOPSY OF SKIN SUBCUTANEOUS TISSUE AND/OR MUCOUS MEMBRANE  12/13/2023   Procedure: BIOPSY, SKIN, SUBCUTANEOUS TISSUE, OR MUCOUS MEMBRANE;  Surgeon: Federico Rosario  C, MD;  Location: [facility];  Service: Gastroenterology;;   BREAST EXCISIONAL BIOPSY Right 09/16/2009   BREAST LUMPECTOMY WITH RADIOACTIVE SEED LOCALIZATION Bilateral 07/09/2021   Procedure: BILATERAL BREAST LUMPECTOMY WITH RADIOACTIVE SEED LOCALIZATION;  Surgeon: [name], MD;  Location: [facility];  Service: General;  Laterality: Bilateral;    BREAST SURGERY     lumpectomy   ESOPHAGOGASTRODUODENOSCOPY N/A 12/13/2023   Procedure: EGD (ESOPHAGOGASTRODUODENOSCOPY);  Surgeon: Federico Rosario BROCKS, MD;  Location: [facility];  Service: Gastroenterology;  Laterality: N/A;   ESOPHAGOGASTRODUODENOSCOPY N/A 12/26/2023   Procedure: EGD (ESOPHAGOGASTRODUODENOSCOPY);  Surgeon: Wilhelmenia Aloha Raddle., MD;  Location: [facility];  Service: Gastroenterology;  Laterality: N/A;   ESOPHAGOGASTRODUODENOSCOPY N/A 12/27/2023   Procedure: EGD (ESOPHAGOGASTRODUODENOSCOPY);  Surgeon: Wilhelmenia Aloha Raddle., MD;  Location: [facility];  Service: Gastroenterology;  Laterality: N/A;   ESOPHAGOGASTRODUODENOSCOPY N/A 03/08/2024   Procedure: EGD (ESOPHAGOGASTRODUODENOSCOPY);  Surgeon: Wilhelmenia Aloha Raddle., MD;  Location: [facility];  Service: Gastroenterology;  Laterality: N/A;   ESOPHAGOGASTRODUODENOSCOPY (EGD) WITH PROPOFOL  N/A 06/29/2022   Procedure: ESOPHAGOGASTRODUODENOSCOPY (EGD) WITH PROPOFOL ;  Surgeon: Legrand Victory LITTIE DOUGLAS, MD;  Location: [facility];  Service: Gastroenterology;  Laterality: N/A;   EUS N/A 12/26/2023   Procedure: ULTRASOUND, UPPER GI TRACT, ENDOSCOPIC;  Surgeon: Wilhelmenia Aloha Raddle., MD;  Location: [facility];  Service: Gastroenterology;  Laterality: N/A;   EUS N/A 12/27/2023   Procedure: ULTRASOUND, UPPER GI TRACT, ENDOSCOPIC;  Surgeon: Wilhelmenia Aloha Raddle., MD;  Location: [facility];  Service: Gastroenterology;  Laterality: N/A;   EUS N/A 03/08/2024   Procedure: ULTRASOUND, UPPER GI TRACT, ENDOSCOPIC;  Surgeon: Wilhelmenia Aloha Raddle., MD;  Location: [facility];  Service: Gastroenterology;  Laterality: N/A;   IR CM INJ ANY COLONIC TUBE W/FLUORO  01/20/2024   IR IMAGING GUIDED PORT INSERTION  02/06/2024   IR REPLC DUODEN/JEJUNO TUBE PERCUT W/FLUORO  02/03/2024   IR REPLC GASTRO/COLONIC TUBE PERCUT W/FLUORO  01/20/2024   LAPAROSCOPIC INSERTION GASTROSTOMY TUBE N/A 01/10/2024   Procedure: laparoscopic insertion of gastrostomy tube,  laparoscopic insertion of jejunostomy tube, upper endoscopy;  Surgeon: Kinsinger, Herlene Righter, MD;  Location: [facility];  Service: General;  Laterality: N/A;  Laproscopic insertion   LAPAROSCOPIC ROUX-EN-Y GASTRIC BYPASS WITH HIATAL HERNIA REPAIR N/A 01/02/2024   Procedure: LAPAROSCOPIC CREATION GASTRJEJONOSTOMY;  Surgeon: Lyndel Deward PARAS, MD;  Location: [facility];  Service: General;  Laterality: N/A;  GASTROJEJUNOSTOMY   PORT-A-CATH REMOVAL Left 07/09/2021   Procedure: REMOVAL PORT-A-CATH;  Surgeon: [name], MD;  Location: [facility];  Service: General;  Laterality: Left;   PORTACATH PLACEMENT Left 01/26/2021   Procedure: INSERTION PORT-A-CATH;  Surgeon: [name], MD;  Location: [facility];  Service: General;  Laterality: Left;   RADIOACTIVE SEED GUIDED AXILLARY SENTINEL LYMPH NODE Right 07/09/2021   Procedure: RADIOACTIVE SEED GUIDED RIGHT AXILLARY SENTINEL LYMPH NODE DISSECTION;  Surgeon: [name], MD;  Location:  SURGERY CENTER;  Service: General;  Laterality: Right;   TUBAL LIGATION       reports that she has never smoked. She has never used smokeless tobacco. She reports that she does not currently use alcohol after a past usage of about 7.0 standard drinks of alcohol per week. She reports that she does not use drugs.  Allergies  Allergen Reactions   Enhertu  [Fam-Trastuzumab  Deruxtec-Nxki] Other (See Comments)    Headache and chest pain. See progress note from 02/16/2024. Patient able to complete infusion.    Family History  Problem Relation Age of Onset   Breast cancer Mother  20s   Hypertension Father    Uterine cancer Maternal Aunt    Ovarian cancer Maternal Aunt    Breast cancer Maternal Aunt        81s   Breast cancer Cousin 55   Uterine cancer Cousin 41   Colon polyps Neg Hx    Colon cancer Neg Hx    Esophageal cancer Neg Hx    Stomach cancer Neg Hx     Prior to Admission medications   Medication Sig Start Date End  Date Taking? Authorizing Provider  acetaminophen  (TYLENOL ) 325 MG tablet Take 2 tablets (650 mg total) by mouth every 6 (six) hours as needed for mild pain (pain score 1-3) or fever (or Fever >/= 101). Patient not taking: Reported on 03/29/2024 03/09/24   Rosario Eland I, MD  calcitRIOL  (ROCALTROL ) 0.25 MCG capsule Take 1 capsule (0.25 mcg total) by mouth 2 (two) times daily. 03/30/24   [name], NP  Calcium  Carb-Cholecalciferol  (CALCIUM  600+D) 600-10 MG-MCG TABS Take 1 tablet by mouth 3 (three) times daily with meals. 03/30/24   [name], NP  fentaNYL  (DURAGESIC ) 25 MCG/HR Place 1 patch onto the skin every 3 (three) days. 03/11/24   Rosario Eland FERNS, MD  lidocaine -prilocaine  (EMLA ) cream Apply to affected area once Patient taking differently: Apply 1 Application topically daily as needed (for port). 02/08/24   [name], [name], MD  magnesium  oxide (MAG-OX) 400 (240 Mg) MG tablet Take 1 tablet (400 mg total) by mouth 2 (two) times daily. 03/28/24 06/26/24  [place], Eric J, DO  ondansetron  (ZOFRAN ) 8 MG tablet Take 1 tablet (8 mg total) by mouth every 8 (eight) hours as needed for nausea or vomiting. Start on the third day after chemotherapy. 02/08/24   [name], MD  oxyCODONE  (ROXICODONE ) 15 MG immediate release tablet Take 1 tablet (15 mg total) by mouth every 4 (four) hours as needed for pain. 03/29/24   [name], Athena N, NP  prochlorperazine  (COMPAZINE ) 10 MG tablet Take 1 tablet (10 mg total) by mouth every 6 (six) hours as needed for nausea or vomiting. 02/08/24   [name], MD    Physical Exam: Vitals:   04/01/24 1748 04/01/24 1840 04/01/24 1843  BP: 124/88  (!) 123/90  Pulse: 82  69  Resp: 16  18  Temp: 98.6 F (37 C)    TempSrc: Oral    SpO2: 100% 100% 100%    Physical Exam Vitals reviewed.  Constitutional:      General: She is not in acute distress. HENT:     Head: Normocephalic and atraumatic.  Eyes:     Extraocular Movements:  Extraocular movements intact.  Cardiovascular:     Rate and Rhythm: Normal rate and regular rhythm.     Heart sounds: Normal heart sounds.  Pulmonary:     Effort: Pulmonary effort is normal. No respiratory distress.     Breath sounds: Normal breath sounds.  Abdominal:     General: Bowel sounds are normal. There is no distension.     Palpations: Abdomen is soft.     Tenderness: There is abdominal tenderness. There is no guarding.     Comments: Generalized tenderness to palpation Leakage around G-tube insertion site  Musculoskeletal:     Cervical back: Normal range of motion.     Right lower leg: No edema.     Left lower leg: Edema present.     Comments: Abrasions noted on right knee, decreased range of motion secondary to pain.  Skin:  General: Skin is warm and dry.  Neurological:     General: No focal deficit present.     Mental Status: She is alert and oriented to person, place, and time.     Labs on Admission: I have personally reviewed following labs and imaging studies  CBC: Recent Labs  Lab 03/26/24 0406 03/27/24 0413 03/28/24 0507 03/30/24 0914 04/01/24 1825  WBC 3.8* 4.1 3.1* 3.9* 3.4*  NEUTROABS  --   --   --  2.1 1.7  HGB 8.1* 8.4* 7.3* 10.0* 9.3*  HCT 25.0* 25.7* 23.2* 29.1* 28.9*  MCV 81.2 82.4 83.8 77.4* 81.0  PLT 110* 114* 112* 146* 159   Basic Metabolic Panel: Recent Labs  Lab 03/26/24 0406 03/26/24 1315 03/27/24 0413 03/27/24 1206 03/27/24 2328 03/28/24 0507 03/28/24 1108 03/30/24 0914 04/01/24 1825  NA 135  --  134*  --   --  136  --  137 138  K 3.8  --  4.1  --   --  4.0  --  3.2* 3.2*  CL 106  --  107  --   --  108  --  109 106  CO2 19*  --  19*  --   --  19*  --  22 20*  GLUCOSE 87  --  100*  --   --  83  --  96 96  BUN <5*  --  <5*  --   --  <5*  --  <5* <5*  CREATININE 0.33*  --  0.30*  --   --  0.41*  --  0.32* 0.31*  CALCIUM  6.4*   < > 6.8*   < > 7.8* 7.9* 8.1* 6.0* 5.4*  MG 1.4*  --  1.9  --   --  1.8  --  1.4*  --   PHOS 2.3*   --  2.8  --   --  2.0*  --   --   --    < > = values in this interval not displayed.   GFR: Estimated Creatinine Clearance: 93 mL/min (A) (by C-G formula based on SCr of 0.31 mg/dL (L)). Liver Function Tests: Recent Labs  Lab 03/26/24 0406 03/27/24 0413 03/28/24 0507 03/30/24 0914 04/01/24 1825  AST 28 37 23 14* 22  ALT 60* 54* 37 22 23  ALKPHOS 1,165* 1,049* 912* 893* 809*  BILITOT 0.4 0.4 0.3 0.5 0.4  PROT 5.2* 5.0* 4.9* 5.9* 5.7*  ALBUMIN  3.0* 3.0* 2.7* 3.2* 3.3*   Recent Labs  Lab 04/01/24 1825  LIPASE 24   No results for input(s): AMMONIA in the last 168 hours. Coagulation Profile: No results for input(s): INR, PROTIME in the last 168 hours. Cardiac Enzymes: No results for input(s): CKTOTAL, CKMB, CKMBINDEX, TROPONINI in the last 168 hours. BNP (last 3 results) No results for input(s): PROBNP in the last 8760 hours. HbA1C: No results for input(s): HGBA1C in the last 72 hours. CBG: Recent Labs  Lab 03/26/24 0620  GLUCAP 79   Lipid Profile: No results for input(s): CHOL, HDL, LDLCALC, TRIG, CHOLHDL, LDLDIRECT in the last 72 hours. Thyroid  Function Tests: No results for input(s): TSH, T4TOTAL, FREET4, T3FREE, THYROIDAB in the last 72 hours. Anemia Panel: No results for input(s): VITAMINB12, FOLATE, FERRITIN, TIBC, IRON, RETICCTPCT in the last 72 hours. Urine analysis:    Component Value Date/Time   COLORURINE YELLOW 02/22/2024 1223   APPEARANCEUR CLEAR 02/22/2024 1223   LABSPEC 1.015 02/22/2024 1223   PHURINE 6.0 02/22/2024 1223   GLUCOSEU NEGATIVE 02/22/2024  1223   HGBUR NEGATIVE 02/22/2024 1223   BILIRUBINUR NEGATIVE 02/22/2024 1223   KETONESUR 5 (A) 02/22/2024 1223   PROTEINUR NEGATIVE 02/22/2024 1223   UROBILINOGEN 0.2 11/01/2013 1246   NITRITE NEGATIVE 02/22/2024 1223   LEUKOCYTESUR NEGATIVE 02/22/2024 1223    Radiological Exams on Admission: CT ABDOMEN PELVIS W CONTRAST Result Date:  04/01/2024 CLINICAL DATA:  Abdominal pain EXAM: CT ABDOMEN AND PELVIS WITH CONTRAST TECHNIQUE: Multidetector CT imaging of the abdomen and pelvis was performed using the standard protocol following bolus administration of intravenous contrast. RADIATION DOSE REDUCTION: This exam was performed according to the departmental dose-optimization program which includes automated exposure control, adjustment of the mA and/or kV according to patient size and/or use of iterative reconstruction technique. CONTRAST:  OMNIPAQUE  IOHEXOL  300 MG/ML  SOLN COMPARISON:  03/05/2024 FINDINGS: Lower chest: Small right pleural effusion. Hepatobiliary: No focal hepatic abnormality. Gallbladder unremarkable. Pancreas: No focal abnormality or ductal dilatation. Spleen: No focal abnormality.  Normal size. Adrenals/Urinary Tract: No suspicious renal or adrenal abnormality. No stones or hydronephrosis. Urinary bladder unremarkable. Stomach/Bowel: Colonic diverticulosis. No active diverticulitis. Gastrostomy tube within the stomach. Small bowel decompressed. No bowel obstruction or inflammatory process. Vascular/Lymphatic: No evidence of aneurysm or adenopathy. Reproductive: Prior hysterectomy.  No adnexal masses. Other: No free fluid or free air. Omental infiltration again noted anteriorly, stable since prior study compatible with peritoneal carcinomatosis. Left catheter tract again noted in the left abdominal wall with small gas and fluid collection in the region of the left oblique muscles, now measuring approximately 1.6 x 1 cm compared to 2.2 by 0.5 cm previously. Musculoskeletal: Diffuse mixed lytic and sclerotic metastatic disease throughout the osseous structures. This is unchanged. IMPRESSION: Small right pleural effusion. Stable omental infiltration compatible with peritoneal carcinomatosis. Further decreased size of the small fluid collection in the left abdominal wall in the area of the left oblique muscles which appears to  communicate with the catheter tract in the left abdominal wall. Colonic diverticulosis. Electronically Signed   By: Franky Crease M.D.   On: 04/01/2024 20:45    Assessment and Plan  Electrolyte abnormalities (hypokalemia, hypocalcemia) Continue to monitor labs closely and replace electrolytes.  Magnesium  level pending.  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  Alk phos chronically elevated and stable in the setting of osseous metastasis.  Transaminases and T. bili normal.  Lipase normal.  CT showing stable intra-abdominal findings.  Continue IV fluid hydration, pain management, and antiemetic as needed.  EKG ordered to check QT interval given electrolyte abnormalities.  C. difficile PCR and GI pathogen panel ordered, enteric precautions.  G-tube malfunction/leakage Consult IR in the morning.  Recent mechanical fall Patient is reporting severe pain in her right knee and also reports hitting her head.  Stat CT head and x-ray of right knee ordered.  Fall precautions.  Left lower leg edema Patient is not reporting any pain or injury to this leg.  Check D-dimer given history of DVT in the past, if positive, Doppler ultrasound needs to be ordered to rule out DVT.  No shortness of breath, chest pain, tachycardia, or hypoxia to suggest PE at this time.  She is satting 100% on room air.  Chronic cancer related pain Continue home fentanyl  patch and Oxy IR PRN.  Chronic anemia Hemoglobin appears to be at baseline.  DVT prophylaxis: No anticoagulation until imaging  studies done as above.  No SCDs until D-dimer checked as above. Code Status: Full Code (discussed with the patient) Family Communication: Female at bedside whom the patient introduced as a family member but did not disclose  exact relationship. Level of care: Telemetry bed Admission status: It is my clinical opinion that referral for OBSERVATION is reasonable and necessary in this patient based on the above information provided. The aforementioned taken together are felt to place the patient at high risk for further clinical deterioration. However, it is anticipated that the patient may be medically stable for discharge from the hospital within 24 to 48 hours.  Editha Ram MD [facility]  If 7PM-7AM, please contact night-coverage [email or web address]  04/01/2024, 9:29 PM

## 2024-04-01 NOTE — ED Triage Notes (Signed)
 Pt states that she was recently admitted to the hospital with electrolyte abnormalities secondary to N/V. Pt states that she was discharged Wednesday and since then has been having abd pain with N/V and leakage from her G-tube and ostomy sites.

## 2024-04-02 ENCOUNTER — Inpatient Hospital Stay: Payer: MEDICAID | Admitting: Licensed Clinical Social Worker

## 2024-04-02 ENCOUNTER — Other Ambulatory Visit: Payer: Self-pay

## 2024-04-02 ENCOUNTER — Inpatient Hospital Stay: Payer: MEDICAID

## 2024-04-02 ENCOUNTER — Inpatient Hospital Stay: Payer: MEDICAID | Admitting: Adult Health

## 2024-04-02 ENCOUNTER — Encounter (HOSPITAL_COMMUNITY): Payer: Self-pay | Admitting: Internal Medicine

## 2024-04-02 ENCOUNTER — Other Ambulatory Visit (HOSPITAL_COMMUNITY): Payer: Self-pay

## 2024-04-02 ENCOUNTER — Observation Stay (HOSPITAL_COMMUNITY): Payer: MEDICAID

## 2024-04-02 ENCOUNTER — Encounter: Payer: Self-pay | Admitting: Hematology and Oncology

## 2024-04-02 DIAGNOSIS — Z931 Gastrostomy status: Secondary | ICD-10-CM | POA: Diagnosis not present

## 2024-04-02 DIAGNOSIS — M7989 Other specified soft tissue disorders: Secondary | ICD-10-CM

## 2024-04-02 DIAGNOSIS — R000 Tachycardia, unspecified: Secondary | ICD-10-CM | POA: Diagnosis not present

## 2024-04-02 DIAGNOSIS — K632 Fistula of intestine: Secondary | ICD-10-CM | POA: Diagnosis present

## 2024-04-02 DIAGNOSIS — C784 Secondary malignant neoplasm of small intestine: Secondary | ICD-10-CM | POA: Diagnosis present

## 2024-04-02 DIAGNOSIS — C786 Secondary malignant neoplasm of retroperitoneum and peritoneum: Secondary | ICD-10-CM | POA: Diagnosis not present

## 2024-04-02 DIAGNOSIS — R7881 Bacteremia: Secondary | ICD-10-CM | POA: Diagnosis not present

## 2024-04-02 DIAGNOSIS — C7889 Secondary malignant neoplasm of other digestive organs: Secondary | ICD-10-CM | POA: Diagnosis not present

## 2024-04-02 DIAGNOSIS — J45909 Unspecified asthma, uncomplicated: Secondary | ICD-10-CM | POA: Diagnosis present

## 2024-04-02 DIAGNOSIS — R4589 Other symptoms and signs involving emotional state: Secondary | ICD-10-CM | POA: Diagnosis not present

## 2024-04-02 DIAGNOSIS — G929 Unspecified toxic encephalopathy: Secondary | ICD-10-CM | POA: Diagnosis not present

## 2024-04-02 DIAGNOSIS — C7989 Secondary malignant neoplasm of other specified sites: Secondary | ICD-10-CM | POA: Diagnosis present

## 2024-04-02 DIAGNOSIS — Z7189 Other specified counseling: Secondary | ICD-10-CM | POA: Diagnosis not present

## 2024-04-02 DIAGNOSIS — Z66 Do not resuscitate: Secondary | ICD-10-CM | POA: Diagnosis not present

## 2024-04-02 DIAGNOSIS — N1831 Chronic kidney disease, stage 3a: Secondary | ICD-10-CM | POA: Diagnosis present

## 2024-04-02 DIAGNOSIS — C50919 Malignant neoplasm of unspecified site of unspecified female breast: Secondary | ICD-10-CM | POA: Diagnosis not present

## 2024-04-02 DIAGNOSIS — I10 Essential (primary) hypertension: Secondary | ICD-10-CM | POA: Diagnosis not present

## 2024-04-02 DIAGNOSIS — G893 Neoplasm related pain (acute) (chronic): Secondary | ICD-10-CM | POA: Diagnosis not present

## 2024-04-02 DIAGNOSIS — D630 Anemia in neoplastic disease: Secondary | ICD-10-CM | POA: Diagnosis present

## 2024-04-02 DIAGNOSIS — I129 Hypertensive chronic kidney disease with stage 1 through stage 4 chronic kidney disease, or unspecified chronic kidney disease: Secondary | ICD-10-CM | POA: Diagnosis present

## 2024-04-02 DIAGNOSIS — C50911 Malignant neoplasm of unspecified site of right female breast: Secondary | ICD-10-CM | POA: Diagnosis present

## 2024-04-02 DIAGNOSIS — K9423 Gastrostomy malfunction: Secondary | ICD-10-CM | POA: Diagnosis present

## 2024-04-02 DIAGNOSIS — D649 Anemia, unspecified: Secondary | ICD-10-CM | POA: Diagnosis not present

## 2024-04-02 DIAGNOSIS — L02211 Cutaneous abscess of abdominal wall: Secondary | ICD-10-CM | POA: Diagnosis present

## 2024-04-02 DIAGNOSIS — E878 Other disorders of electrolyte and fluid balance, not elsewhere classified: Secondary | ICD-10-CM | POA: Diagnosis not present

## 2024-04-02 DIAGNOSIS — K9401 Colostomy hemorrhage: Secondary | ICD-10-CM | POA: Diagnosis present

## 2024-04-02 DIAGNOSIS — E871 Hypo-osmolality and hyponatremia: Secondary | ICD-10-CM | POA: Diagnosis present

## 2024-04-02 DIAGNOSIS — Z515 Encounter for palliative care: Secondary | ICD-10-CM | POA: Diagnosis not present

## 2024-04-02 DIAGNOSIS — Z980 Intestinal bypass and anastomosis status: Secondary | ICD-10-CM | POA: Diagnosis not present

## 2024-04-02 DIAGNOSIS — K315 Obstruction of duodenum: Secondary | ICD-10-CM | POA: Diagnosis present

## 2024-04-02 DIAGNOSIS — R579 Shock, unspecified: Secondary | ICD-10-CM | POA: Diagnosis not present

## 2024-04-02 DIAGNOSIS — R6521 Severe sepsis with septic shock: Secondary | ICD-10-CM | POA: Diagnosis present

## 2024-04-02 DIAGNOSIS — R112 Nausea with vomiting, unspecified: Secondary | ICD-10-CM | POA: Diagnosis not present

## 2024-04-02 DIAGNOSIS — K529 Noninfective gastroenteritis and colitis, unspecified: Secondary | ICD-10-CM | POA: Diagnosis not present

## 2024-04-02 DIAGNOSIS — C7951 Secondary malignant neoplasm of bone: Secondary | ICD-10-CM | POA: Diagnosis present

## 2024-04-02 DIAGNOSIS — C799 Secondary malignant neoplasm of unspecified site: Secondary | ICD-10-CM | POA: Diagnosis not present

## 2024-04-02 DIAGNOSIS — K831 Obstruction of bile duct: Secondary | ICD-10-CM | POA: Diagnosis present

## 2024-04-02 DIAGNOSIS — R197 Diarrhea, unspecified: Secondary | ICD-10-CM | POA: Diagnosis not present

## 2024-04-02 DIAGNOSIS — B961 Klebsiella pneumoniae [K. pneumoniae] as the cause of diseases classified elsewhere: Secondary | ICD-10-CM | POA: Diagnosis present

## 2024-04-02 DIAGNOSIS — E440 Moderate protein-calorie malnutrition: Secondary | ICD-10-CM | POA: Diagnosis present

## 2024-04-02 DIAGNOSIS — F32A Depression, unspecified: Secondary | ICD-10-CM | POA: Diagnosis present

## 2024-04-02 DIAGNOSIS — A4159 Other Gram-negative sepsis: Secondary | ICD-10-CM | POA: Diagnosis present

## 2024-04-02 LAB — BASIC METABOLIC PANEL WITH GFR
Anion gap: 9 (ref 5–15)
BUN: <5 mg/dL — ABNORMAL LOW (ref 6–20)
CO2: 21 mmol/L — ABNORMAL LOW (ref 22–32)
Calcium: 5.3 mg/dL — CL (ref 8.9–10.3)
Chloride: 108 mmol/L (ref 98–111)
Creatinine, Ser: <0.3 mg/dL — ABNORMAL LOW (ref 0.44–1.00)
Glucose, Bld: 83 mg/dL (ref 70–99)
Potassium: 3.2 mmol/L — ABNORMAL LOW (ref 3.5–5.1)
Sodium: 137 mmol/L (ref 135–145)

## 2024-04-02 LAB — PHOSPHORUS: Phosphorus: 1.5 mg/dL — ABNORMAL LOW (ref 2.5–4.6)

## 2024-04-02 LAB — D-DIMER, QUANTITATIVE: D-Dimer, Quant: 3.2 ug{FEU}/mL — ABNORMAL HIGH (ref 0.00–0.50)

## 2024-04-02 LAB — C DIFFICILE QUICK SCREEN W PCR REFLEX
C Diff antigen: NEGATIVE
C Diff interpretation: NOT DETECTED
C Diff toxin: NEGATIVE

## 2024-04-02 LAB — MAGNESIUM: Magnesium: 1.6 mg/dL — ABNORMAL LOW (ref 1.7–2.4)

## 2024-04-02 MED ORDER — MAGNESIUM SULFATE IN D5W 1-5 GM/100ML-% IV SOLN
1.0000 g | Freq: Once | INTRAVENOUS | Status: AC
  Administered 2024-04-02: 1 g via INTRAVENOUS
  Filled 2024-04-02: qty 100

## 2024-04-02 MED ORDER — POTASSIUM CHLORIDE 10 MEQ/100ML IV SOLN
10.0000 meq | INTRAVENOUS | Status: AC
Start: 2024-04-02 — End: 2024-04-02
  Administered 2024-04-02 (×4): 10 meq via INTRAVENOUS
  Filled 2024-04-02 (×4): qty 100

## 2024-04-02 MED ORDER — HYDROMORPHONE HCL 1 MG/ML IJ SOLN
0.5000 mg | Freq: Once | INTRAMUSCULAR | Status: AC
  Administered 2024-04-02: 0.5 mg via INTRAVENOUS
  Filled 2024-04-02: qty 0.5

## 2024-04-02 MED ORDER — PROCHLORPERAZINE EDISYLATE 10 MG/2ML IJ SOLN
10.0000 mg | Freq: Four times a day (QID) | INTRAMUSCULAR | Status: DC | PRN
Start: 2024-04-02 — End: 2024-04-04
  Administered 2024-04-02 – 2024-04-04 (×7): 10 mg via INTRAVENOUS
  Filled 2024-04-02 (×7): qty 2

## 2024-04-02 MED ORDER — POTASSIUM PHOSPHATES 15 MMOLE/5ML IV SOLN
30.0000 mmol | Freq: Once | INTRAVENOUS | Status: AC
  Administered 2024-04-02: 30 mmol via INTRAVENOUS
  Filled 2024-04-02: qty 10

## 2024-04-02 MED ORDER — MAGNESIUM OXIDE -MG SUPPLEMENT 400 (240 MG) MG PO TABS
400.0000 mg | ORAL_TABLET | Freq: Two times a day (BID) | ORAL | Status: DC
  Administered 2024-04-02: 400 mg via ORAL
  Filled 2024-04-02 (×3): qty 1

## 2024-04-02 MED ORDER — TRIMETHOBENZAMIDE HCL 100 MG/ML IM SOLN
200.0000 mg | Freq: Three times a day (TID) | INTRAMUSCULAR | Status: DC | PRN
  Administered 2024-04-02 – 2024-04-04 (×5): 200 mg via INTRAMUSCULAR
  Filled 2024-04-02 (×9): qty 2

## 2024-04-02 MED ORDER — SODIUM CHLORIDE 0.9 % IV SOLN
INTRAVENOUS | Status: AC
Start: 2024-04-02 — End: 2024-04-02

## 2024-04-02 MED ORDER — MAGNESIUM SULFATE IN D5W 1-5 GM/100ML-% IV SOLN
1.0000 g | Freq: Once | INTRAVENOUS | Status: AC
Start: 2024-04-02 — End: 2024-04-02
  Administered 2024-04-02: 1 g via INTRAVENOUS
  Filled 2024-04-02: qty 100

## 2024-04-02 MED ORDER — HYDROMORPHONE HCL 1 MG/ML IJ SOLN
1.0000 mg | INTRAMUSCULAR | Status: DC | PRN
  Administered 2024-04-02 (×3): 1 mg via INTRAVENOUS
  Filled 2024-04-02 (×3): qty 1

## 2024-04-02 MED ORDER — CALCIUM GLUCONATE-NACL 1-0.675 GM/50ML-% IV SOLN
1.0000 g | Freq: Once | INTRAVENOUS | Status: AC
  Administered 2024-04-02: 1000 mg via INTRAVENOUS
  Filled 2024-04-02: qty 50

## 2024-04-02 MED ORDER — HYDROMORPHONE HCL 1 MG/ML IJ SOLN
1.0000 mg | INTRAMUSCULAR | Status: DC | PRN
  Administered 2024-04-02 – 2024-04-04 (×9): 1 mg via INTRAVENOUS
  Filled 2024-04-02 (×9): qty 1

## 2024-04-02 NOTE — Progress Notes (Signed)
 VASCULAR LAB    Left lower extremity venous duplex has been performed.  See CV proc for preliminary results.   Sherry Rogus, RVT 04/02/2024, 2:21 PM

## 2024-04-02 NOTE — Progress Notes (Signed)
 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  Recently admitted 9/29-10/8 with similar issues and going home.  This is her third admission in 1 month. In the emergency room hemodynamically stable.  Hemoglobin is stable.  Potassium 3.2, calcium  5.4.  Phosphorus was low.  CT scan with peritoneal carcinomatosis, improved small fluid collection in the left abdominal wall and they have left oblique muscles.  Admitted for symptomatic treatment.  Subjective: Patient seen and examined.  Her main concern was continued to have drainage from the G-tube site. Patient believes that she gets sick when she has drainage around the tube.  Patient tells me she eats adequate, however then again asking for nausea medications frequently.  Afebrile.  She thinks her pain is not adequately managed and needs some injectable pain medications.   Assessment & Plan:   Generalized abdominal pain, nausea vomiting.  Cancer related pain.  Peritoneal carcinomatosis: G-tube malfunction, peritoneal cutaneous fistula.  Does not have any evidence of intra-abdominal infection at this time.  Multiple recent investigations and surgical consult recommended not to remove the G-tube. Symptomatic management, apply dressing and keep G-tube site clean. Eakin's patch to the fistula. Adequate oral and IV pain medications-palliative care to follow-up. Adequate nausea medications.  Maintenance fluid  today. Continue goal of care discussion with palliative care.  Multiple electrolyte abnormalities: Hypokalemia Hypocalcemia Hypophosphatemia Hypomagnesemia Replace aggressively today.  Monitor levels.  Will need ongoing high-dose oral replacement.  Anemia of chronic disease: Hemoglobin is stable.  Elevated D-dimer: Will check DVT studies.    DVT prophylaxis: Starting on Lovenox  prophylaxis.   Code Status: Full code Family Communication: None at the bedside Disposition Plan: Status is: Observation The patient will require care spanning > 2 midnights and should be moved to inpatient because: Persistent pain, IV pain medications and IV nausea medications     Consultants:  Oncology Palliative  Procedures:  None  Antimicrobials:  None     Objective: Vitals:   04/01/24 2257 04/02/24 0308 04/02/24 0622 04/02/24 1004  BP: 120/82 108/74 110/69 128/85  Pulse: 73 80 78 78  Resp: 17 17 17 14   Temp: 97.8 F (36.6 C) 98.6 F (37 C) 98 F (36.7 C)   TempSrc: Oral Oral Oral   SpO2: 100% 100% 97% 98%  Weight:      Height:        Intake/Output Summary (Last 24 hours) at 04/02/2024 1120 Last data filed at 04/02/2024 0400 Gross per 24 hour  Intake 2019.89 ml  Output --  Net 2019.89 ml   Filed Weights   04/01/24 2256  Weight: 75.3 kg    Examination:  General exam: Appears calm and comfortable.  Pleasant and interactive. Respiratory system: Clear to auscultation. Respiratory effort normal.  No added sounds.  Port-A-Cath present on the right chest wall. Cardiovascular system: S1 & S2 heard, RRR. No JVD, murmurs, rubs, gallops or clicks.  Trace pedal edema both legs. Gastrointestinal system: Soft.  Mildly tender along the epigastrium.  G-tube intact, widened exit site with minimal serous drainage.  Left lateral quadrant cutaneous fistula with serosanguineous drainage,  applied Eakin's patch.  No rigidity or guarding.  Bowel sound present. Central nervous system: Alert  and oriented. No focal neurological deficits. Extremities: Symmetric 5 x 5 power. Skin: No rashes, lesions or ulcers Psychiatry: Judgement and insight appear normal. Mood & affect appropriate.     Data Reviewed: I have personally reviewed following labs and imaging studies  CBC: Recent Labs  Lab 03/27/24 0413 03/28/24 0507 03/30/24 0914 04/01/24 1825  WBC 4.1 3.1* 3.9* 3.4*  NEUTROABS  --   --  2.1 1.7  HGB 8.4* 7.3* 10.0* 9.3*  HCT 25.7* 23.2* 29.1* 28.9*  MCV 82.4 83.8 77.4* 81.0  PLT 114* 112* 146* 159   Basic Metabolic Panel: Recent Labs  Lab 03/27/24 0413 03/27/24 1206 03/28/24 0507 03/28/24 1108 03/30/24 0914 04/01/24 1825 04/01/24 2234 04/02/24 0228  NA 134*  --  136  --  137 138  --  137  K 4.1  --  4.0  --  3.2* 3.2*  --  3.2*  CL 107  --  108  --  109 106  --  108  CO2 19*  --  19*  --  22 20*  --  21*  GLUCOSE 100*  --  83  --  96 96  --  83  BUN <5*  --  <5*  --  <5* <5*  --  <5*  CREATININE 0.30*  --  0.41*  --  0.32* 0.31*  --  <0.30*  CALCIUM  6.8*   < > 7.9* 8.1* 6.0* 5.4*  --  5.3*  MG 1.9  --  1.8  --  1.4*  --  1.6* 1.6*  PHOS 2.8  --  2.0*  --   --   --   --  1.5*   < > = values in this interval not displayed.   GFR: CrCl cannot be calculated (This lab value cannot be used to calculate CrCl because it is not a number: <0.30). Liver Function Tests: Recent Labs  Lab 03/27/24 0413 03/28/24 0507 03/30/24 0914 04/01/24 1825  AST 37 23 14* 22  ALT 54* 37 22 23  ALKPHOS 1,049* 912* 893* 809*  BILITOT 0.4 0.3 0.5 0.4  PROT 5.0* 4.9* 5.9* 5.7*  ALBUMIN  3.0* 2.7* 3.2* 3.3*   Recent Labs  Lab 04/01/24 1825  LIPASE 24   No results for input(s): AMMONIA in the last 168 hours. Coagulation Profile: No results for input(s): INR, PROTIME in the last 168 hours. Cardiac Enzymes: No results for input(s): CKTOTAL, CKMB, CKMBINDEX, TROPONINI in the last 168 hours. BNP (last 3 results) No results for input(s): PROBNP in the last  8760 hours. HbA1C: No results for input(s): HGBA1C in the last 72 hours. CBG: No results for input(s): GLUCAP in the last 168 hours. Lipid Profile: No results for input(s): CHOL, HDL, LDLCALC, TRIG, CHOLHDL, LDLDIRECT in the last 72 hours. Thyroid  Function Tests: No results for input(s): TSH, T4TOTAL, FREET4, T3FREE, THYROIDAB in the last 72 hours. Anemia Panel: No results for input(s): VITAMINB12, FOLATE, FERRITIN, TIBC, IRON, RETICCTPCT in the last 72 hours. Sepsis Labs: No results for input(s): PROCALCITON, LATICACIDVEN in the last 168 hours.  Recent Results (from the past 240 hours)  C Difficile Quick Screen w PCR reflex     Status: None   Collection Time: 04/01/24 10:21 PM   Specimen: STOOL  Result Value Ref Range Status   C Diff antigen NEGATIVE NEGATIVE Final   C Diff toxin NEGATIVE NEGATIVE Final   C Diff interpretation No  C. difficile detected.  Final    Comment: Performed at [facility], [address] [place], [place] [address]         Radiology Studies: CT HEAD WO CONTRAST ( ) Result Date: 04/02/2024 CLINICAL DATA:  Recent fall with headaches, initial encounter EXAM: CT HEAD WITHOUT CONTRAST TECHNIQUE: Contiguous axial images were obtained from the base of the skull through the vertex without intravenous contrast. RADIATION DOSE REDUCTION: This exam was performed according to the departmental dose-optimization program which includes automated exposure control, adjustment of the mA and/or kV according to patient size and/or use of iterative reconstruction technique. COMPARISON:  None Available. FINDINGS: Brain: No evidence of acute infarction, hemorrhage, hydrocephalus, extra-axial collection or mass lesion/mass effect. Vascular: No hyperdense vessel or unexpected calcification. Skull: Normal. Negative for fracture or focal lesion. Sinuses/Orbits: No acute finding. Other: None. IMPRESSION: No acute  intracranial abnormality noted. Electronically Signed   By: Oneil Devonshire M.D.   On: 04/02/2024 01:22   DG Knee Complete 4 Views Right Result Date: 04/01/2024 CLINICAL DATA:  Right knee pain following fall, initial encounter EXAM: RIGHT KNEE - COMPLETE 4+ VIEW COMPARISON:  None Available. FINDINGS: No evidence of fracture, dislocation, or joint effusion. No evidence of arthropathy or other focal bone abnormality. Soft tissues are unremarkable. IMPRESSION: No acute abnormality noted. Electronically Signed   By: Oneil Devonshire M.D.   On: 04/01/2024 22:44   CT ABDOMEN PELVIS W CONTRAST Result Date: 04/01/2024 CLINICAL DATA:  Abdominal pain EXAM: CT ABDOMEN AND PELVIS WITH CONTRAST TECHNIQUE: Multidetector CT imaging of the abdomen and pelvis was performed using the standard protocol following bolus administration of intravenous contrast. RADIATION DOSE REDUCTION: This exam was performed according to the departmental dose-optimization program which includes automated exposure control, adjustment of the mA and/or kV according to patient size and/or use of iterative reconstruction technique. CONTRAST:  OMNIPAQUE  IOHEXOL  300 MG/ML  SOLN COMPARISON:  03/05/2024 FINDINGS: Lower chest: Small right pleural effusion. Hepatobiliary: No focal hepatic abnormality. Gallbladder unremarkable. Pancreas: No focal abnormality or ductal dilatation. Spleen: No focal abnormality.  Normal size. Adrenals/Urinary Tract: No suspicious renal or adrenal abnormality. No stones or hydronephrosis. Urinary bladder unremarkable. Stomach/Bowel: Colonic diverticulosis. No active diverticulitis. Gastrostomy tube within the stomach. Small bowel decompressed. No bowel obstruction or inflammatory process. Vascular/Lymphatic: No evidence of aneurysm or adenopathy. Reproductive: Prior hysterectomy.  No adnexal masses. Other: No free fluid or free air. Omental infiltration again noted anteriorly, stable since prior study compatible with peritoneal  carcinomatosis. Left catheter tract again noted in the left abdominal wall with small gas and fluid collection in the region of the left oblique muscles, now measuring approximately 1.6 x 1 cm compared to 2.2 by 0.5 cm previously. Musculoskeletal: Diffuse mixed lytic and sclerotic metastatic disease throughout the osseous structures. This is unchanged. IMPRESSION: Small right pleural effusion. Stable omental infiltration compatible with peritoneal carcinomatosis. Further decreased size of the small fluid collection in the left abdominal wall in the area of the left oblique muscles which appears to communicate with the catheter tract in the left abdominal wall. Colonic diverticulosis. Electronically Signed   By: Franky Crease M.D.   On: 04/01/2024 20:45        Scheduled Meds:  calcitRIOL   0.25 mcg Oral BID   calcium  carbonate  1 tablet Oral BID WC   Chlorhexidine  Gluconate Cloth  6 each Topical Daily   fentaNYL   1 patch Transdermal Q72H   magnesium  oxide  400 mg Oral BID   sodium chloride  flush  10-40 mL Intracatheter Q12H   Continuous Infusions:  sodium chloride      potassium chloride  10 mEq (04/02/24 1105)   potassium PHOSPHATE  IVPB (in mmol)       LOS: 0 days    Time spent: 55 minutes    Renato Applebaum, MD [facility]

## 2024-04-02 NOTE — Plan of Care (Signed)
  Problem: Education: Goal: Knowledge of General Education information will improve Description: Including pain rating scale, medication(s)/side effects and non-pharmacologic comfort measures Outcome: Progressing   Problem: Health Behavior/Discharge Planning: Goal: Ability to manage health-related needs will improve Outcome: Progressing   Problem: Clinical Measurements: Goal: Ability to maintain clinical measurements within normal limits will improve Outcome: Progressing Goal: Will remain free from infection Outcome: Progressing Goal: Diagnostic test results will improve Outcome: Progressing Goal: Respiratory complications will improve Outcome: Progressing Goal: Cardiovascular complication will be avoided Outcome: Progressing   Problem: Elimination: Goal: Will not experience complications related to bowel motility Outcome: Progressing Goal: Will not experience complications related to urinary retention Outcome: Progressing   Problem: Pain Managment: Goal: General experience of comfort will improve and/or be controlled Outcome: Progressing   Problem: Safety: Goal: Ability to remain free from injury will improve Outcome: Progressing   Problem: Activity: Goal: Risk for activity intolerance will decrease Outcome: Not Progressing   Problem: Nutrition: Goal: Adequate nutrition will be maintained Outcome: Not Progressing   Problem: Coping: Goal: Level of anxiety will decrease Outcome: Not Progressing   Problem: Skin Integrity: Goal: Risk for impaired skin integrity will decrease Outcome: Not Progressing

## 2024-04-02 NOTE — Plan of Care (Signed)

## 2024-04-02 NOTE — Progress Notes (Signed)
   04/02/24 0852  TOC Brief Assessment  Insurance and Status Reviewed  Patient has primary care physician Yes  Home environment has been reviewed home w/ children  Prior level of function: independent  Prior/Current Home Services No current home services  Social Drivers of Health Review SDOH reviewed no interventions necessary  Readmission risk has been reviewed Yes  Transition of care needs no transition of care needs at this time

## 2024-04-03 ENCOUNTER — Other Ambulatory Visit (HOSPITAL_COMMUNITY): Payer: Self-pay

## 2024-04-03 ENCOUNTER — Encounter: Payer: Self-pay | Admitting: *Deleted

## 2024-04-03 ENCOUNTER — Telehealth: Payer: Self-pay

## 2024-04-03 ENCOUNTER — Other Ambulatory Visit: Payer: Self-pay | Admitting: *Deleted

## 2024-04-03 DIAGNOSIS — E878 Other disorders of electrolyte and fluid balance, not elsewhere classified: Secondary | ICD-10-CM | POA: Diagnosis not present

## 2024-04-03 DIAGNOSIS — C50411 Malignant neoplasm of upper-outer quadrant of right female breast: Secondary | ICD-10-CM

## 2024-04-03 LAB — CBC WITH DIFFERENTIAL/PLATELET
Abs Immature Granulocytes: 0.05 K/uL (ref 0.00–0.07)
Basophils Absolute: 0 K/uL (ref 0.0–0.1)
Basophils Relative: 1 %
Eosinophils Absolute: 0.1 K/uL (ref 0.0–0.5)
Eosinophils Relative: 2 %
HCT: 27.1 % — ABNORMAL LOW (ref 36.0–46.0)
Hemoglobin: 8.7 g/dL — ABNORMAL LOW (ref 12.0–15.0)
Immature Granulocytes: 2 %
Lymphocytes Relative: 28 %
Lymphs Abs: 0.9 K/uL (ref 0.7–4.0)
MCH: 26.3 pg (ref 26.0–34.0)
MCHC: 32.1 g/dL (ref 30.0–36.0)
MCV: 81.9 fL (ref 80.0–100.0)
Monocytes Absolute: 0.4 K/uL (ref 0.1–1.0)
Monocytes Relative: 11 %
Neutro Abs: 1.8 K/uL (ref 1.7–7.7)
Neutrophils Relative %: 56 %
Platelets: 152 K/uL (ref 150–400)
RBC: 3.31 MIL/uL — ABNORMAL LOW (ref 3.87–5.11)
RDW: 17.2 % — ABNORMAL HIGH (ref 11.5–15.5)
WBC: 3.2 K/uL — ABNORMAL LOW (ref 4.0–10.5)
nRBC: 0 % (ref 0.0–0.2)

## 2024-04-03 LAB — GASTROINTESTINAL PANEL BY PCR, STOOL (REPLACES STOOL CULTURE)

## 2024-04-03 LAB — BASIC METABOLIC PANEL WITH GFR
Anion gap: 10 (ref 5–15)
BUN: <5 mg/dL — ABNORMAL LOW (ref 6–20)
CO2: 19 mmol/L — ABNORMAL LOW (ref 22–32)
Calcium: 4.9 mg/dL — CL (ref 8.9–10.3)
Chloride: 107 mmol/L (ref 98–111)
Creatinine, Ser: <0.3 mg/dL — ABNORMAL LOW (ref 0.44–1.00)
Glucose, Bld: 87 mg/dL (ref 70–99)
Potassium: 4 mmol/L (ref 3.5–5.1)
Sodium: 136 mmol/L (ref 135–145)

## 2024-04-03 LAB — PHOSPHORUS: Phosphorus: 1.8 mg/dL — ABNORMAL LOW (ref 2.5–4.6)

## 2024-04-03 MED ORDER — SODIUM CHLORIDE 0.9 % IV SOLN: INTRAVENOUS | Status: DC

## 2024-04-03 MED ORDER — MAGNESIUM OXIDE -MG SUPPLEMENT 400 (240 MG) MG PO TABS: 400.0000 mg | ORAL_TABLET | Freq: Two times a day (BID) | ORAL | Status: DC

## 2024-04-03 MED ORDER — OXYCODONE HCL 5 MG/5ML PO SOLN
10.0000 mg | ORAL | Status: DC | PRN
Start: 2024-04-03 — End: 2024-04-09
  Administered 2024-04-08 – 2024-04-09 (×2): 10 mg via ORAL
  Filled 2024-04-03 (×3): qty 10

## 2024-04-03 MED ORDER — CALCITRIOL 1 MCG/ML PO SOLN
0.2500 ug | Freq: Two times a day (BID) | ORAL | Status: DC
  Administered 2024-04-03 – 2024-04-12 (×18): 0.25 ug
  Filled 2024-04-03 (×20): qty 0.25

## 2024-04-03 MED ORDER — CALCIUM CARBONATE ANTACID 500 MG PO CHEW
800.0000 mg | CHEWABLE_TABLET | Freq: Three times a day (TID) | ORAL | Status: DC
  Administered 2024-04-03: 800 mg via ORAL
  Filled 2024-04-03: qty 4

## 2024-04-03 MED ORDER — CALCIUM GLUCONATE-NACL 2-0.675 GM/100ML-% IV SOLN
2.0000 g | Freq: Four times a day (QID) | INTRAVENOUS | Status: DC
  Administered 2024-04-03 – 2024-04-04 (×4): 2000 mg via INTRAVENOUS
  Filled 2024-04-03 (×4): qty 100

## 2024-04-03 MED ORDER — ADULT MULTIVITAMIN W/MINERALS CH
1.0000 | ORAL_TABLET | Freq: Every day | ORAL | Status: DC
  Administered 2024-04-03 – 2024-04-11 (×9): 1
  Filled 2024-04-03 (×9): qty 1

## 2024-04-03 MED ORDER — STERILE WATER FOR INJECTION IV SOLN
INTRAVENOUS | Status: DC
  Filled 2024-04-03: qty 150
  Filled 2024-04-03: qty 1000
  Filled 2024-04-03 (×2): qty 150
  Filled 2024-04-03 (×2): qty 1000
  Filled 2024-04-03 (×2): qty 150
  Filled 2024-04-03 (×3): qty 1000
  Filled 2024-04-03: qty 150

## 2024-04-03 MED ORDER — MAGNESIUM SULFATE 2 GM/50ML IV SOLN
2.0000 g | Freq: Three times a day (TID) | INTRAVENOUS | Status: DC
  Administered 2024-04-03 – 2024-04-05 (×5): 2 g via INTRAVENOUS
  Filled 2024-04-03 (×5): qty 50

## 2024-04-03 MED ORDER — THIAMINE MONONITRATE 100 MG PO TABS
100.0000 mg | ORAL_TABLET | Freq: Every day | ORAL | Status: AC
Start: 2024-04-03 — End: 2024-04-08
  Administered 2024-04-03 – 2024-04-07 (×5): 100 mg
  Filled 2024-04-03 (×5): qty 1

## 2024-04-03 MED ORDER — CALCIUM GLUCONATE-NACL 2-0.675 GM/100ML-% IV SOLN
2.0000 g | Freq: Once | INTRAVENOUS | Status: AC
  Administered 2024-04-03: 2000 mg via INTRAVENOUS
  Filled 2024-04-03: qty 100

## 2024-04-03 MED ORDER — POTASSIUM PHOSPHATES 15 MMOLE/5ML IV SOLN
30.0000 mmol | Freq: Once | INTRAVENOUS | Status: AC
  Administered 2024-04-03: 30 mmol via INTRAVENOUS
  Filled 2024-04-03: qty 10

## 2024-04-03 MED FILL — Fosaprepitant Dimeglumine For IV Infusion 150 MG (Base Eq): INTRAVENOUS | Qty: 5 | Status: AC

## 2024-04-03 NOTE — Progress Notes (Signed)
 Complex Care Management Care Guide Note  04/03/2024 Name: Chelsea Hansen MRN: [id number] DOB: [birth year]  Chelsea Hansen is a 53 y.o. year old female who is a primary care patient of [name], NP and is actively engaged with the care management team. I reached out to Chelsea Hansen by phone today to assist with re-scheduling  with the BSW.  Follow up plan: Unsuccessful telephone outreach attempt made. A HIPAA compliant phone message was left for the patient providing contact information and requesting a return call.  Leotis Rase [facility], [facility] Guide  Direct Dial: [phone]  Fax [phone]

## 2024-04-03 NOTE — Progress Notes (Signed)
 I discussed with patient about her difficulties tolerating oral medications and electrolyte replacements . Her G tube is in stomach and positioned well with balloon . Patient agreed to start medications and start flushing the tube . Will consult RD to start education about the use and potentially start trickle tube feeding as patient may need it sooner.   Plan:  Start calcium , oxycodone , magnesium  through tube  Tube feeding teaching, tube care  Patient is aware for potential need for tube feeding but wants to hold on it today.

## 2024-04-03 NOTE — Consult Note (Addendum)
 Renal Service Consult Note [facility] Chelsea JONETTA Fret, MD  Patient: Chelsea Hansen Date: 04/03/2024 Requesting Physician: Dr. LOIS Applebaum  Reason for Consult: Hypocalcemia HPI: The patient is a 53 y.o. 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 She had been hospitalized previously from 9/29- 10/08 for similar issues of n/v/d and severe hypocalcemia. In ED VSS, wbc 3K, hb 9.3, K+ 3.2, CO2 20, bun < 5, creat 0.3, alb 3.3, tprot 5.7, Ca 5.4, alkphos 809, w/ normal AST/ ALT and Tbili. CT abd / pelvis showed stable omental infiltration compatible w/ peritoneal carcinomatosis. Pt rec'd IV Ca gluconate and 1 L NS and was admitted. Ca++ level yest was 5.3 and today is 4.9. Mg was 1.6. We are asked to see for hypocalcemia.    Pt seen in room. Not in distress, pleasant. States she has breast cancer in 2020. Then a couple of years later it moved to the bones, and this year she says it went into her abdomen. She states she was here for 12 days on her recent admission. She asks that we not give her tums or other oral medications because her stomach already hurts and taking pills just makes it worse. She denies any tingling or paresthesias or muscles jumping/ fasciculations.   Labs from June 2025 showed a PTH of 308.     ROS - denies CP, no joint pain, no HA, no blurry vision, no rash   Past Medical History  Past Medical History:  Diagnosis Date   Allergy    seasonal allergies   Anxiety    on meds   Asthma    uses inhaler   Breast cancer (HCC) 2012   RIGHT lumpectomy   Cancer (HCC) 2022   RIGHT breast lump-dx 2022   Depression    on meds   DVT (deep venous thrombosis) (HCC) 2010   after hysterectomy   Family history of uterine cancer    GERD (gastroesophageal reflux disease)    with certain  foods/OTC PRN meds   Headache(784.0)    History of radiation therapy    Bilateral breast- 09/03/21-11/02/21- Dr. Lynwood Nasuti   Hyperlipidemia    on meds   Hypertension    on meds   SVT (supraventricular tachycardia)    Past Surgical History  Past Surgical History:  Procedure Laterality Date   ABDOMINAL HYSTERECTOMY     AXILLARY SENTINEL NODE BIOPSY Left 07/09/2021   Procedure: LEFT AXILLARY SENTINEL NODE BIOPSY;  Surgeon: [name], MD;  Location: [facility];  Service: General;  Laterality: Left;   BALLOON DILATION N/A 12/13/2023   Procedure: MERRILL HODGKIN;  Surgeon: Federico Rosario BROCKS, MD;  Location: [facility] ENDOSCOPY;  Service: Gastroenterology;  Laterality: N/A;   BIOPSY  06/29/2022   Procedure: BIOPSY;  Surgeon: Legrand Victory LITTIE DOUGLAS, MD;  Location: [facility] ENDOSCOPY;  Service: Gastroenterology;;   BIOPSY OF SKIN SUBCUTANEOUS TISSUE AND/OR MUCOUS MEMBRANE  12/13/2023   Procedure: BIOPSY, SKIN, SUBCUTANEOUS TISSUE, OR MUCOUS MEMBRANE;  Surgeon: Federico Rosario BROCKS, MD;  Location: [facility] ENDOSCOPY;  Service: Gastroenterology;;   BREAST EXCISIONAL BIOPSY Right 09/16/2009   BREAST LUMPECTOMY WITH RADIOACTIVE SEED LOCALIZATION Bilateral 07/09/2021   Procedure: BILATERAL BREAST LUMPECTOMY WITH RADIOACTIVE SEED LOCALIZATION;  Surgeon: [name], MD;  Location: [facility];  Service: General;  Laterality: Bilateral;   BREAST SURGERY  lumpectomy   ESOPHAGOGASTRODUODENOSCOPY N/A 12/13/2023   Procedure: EGD (ESOPHAGOGASTRODUODENOSCOPY);  Surgeon: Federico Rosario BROCKS, MD;  Location[facility] END[facility]COPY;  Service: Gastroenterology;  Laterality: N/A;   ESOPHAGOGASTRODUODENOSCOPY N/A 12/26/2023   Procedure: EGD (ESOPHAGOGASTRODUODENOSCOPY);  Surgeon: Wilhelmenia Aloha Raddle., MD;  Location: [facility];  Service: Gastroenterology;  Laterality: N/A;   ESOPHAGOGASTRODUODENOSCOPY N/A 12/27/2023   Procedure: EGD (ESOPHAGOGASTRODUODENOSCOPY);  Surgeon: Wilhelmenia Aloha Raddle., MD;   Location: [facility];  Service: Gastroenterology;  Laterality: N/A;   ESOPHAGOGASTRODUODENOSCOPY N/A 03/08/2024   Procedure: EGD (ESOPHAGOGASTRODUODENOSCOPY);  Surgeon: Wilhelmenia Aloha Raddle., MD;  Location: [facility];  Service: Gastroenterology;  Laterality: N/A;   ESOPHAGOGASTRODUODENOSCOPY (EGD) WITH PROPOFOL  N/A 06/29/2022   Procedure: ESOPHAGOGASTRODUODENOSCOPY (EGD) WITH PROPOFOL ;  Surgeon: Legrand Victory LITTIE DOUGLAS, MD;  Location: [facility];  Service: Gastroenterology;  Laterality: N/A;   EUS N/A 12/26/2023   Procedure: ULTRASOUND, UPPER GI TRACT, ENDOSCOPIC;  Surgeon: Wilhelmenia Aloha Raddle., MD;  Location: [facility];  Service: Gastroenterology;  Laterality: N/A;   EUS N/A 12/27/2023   Procedure: ULTRASOUND, UPPER GI TRACT, ENDOSCOPIC;  Surgeon: Wilhelmenia Aloha Raddle., MD;  Location: [facility];  Service: Gastroenterology;  Laterality: N/A;   EUS N/A 03/08/2024   Procedure: ULTRASOUND, UPPER GI TRACT, ENDOSCOPIC;  Surgeon: Wilhelmenia Aloha Raddle., MD;  Location: [facility];  Service: Gastroenterology;  Laterality: N/A;   IR CM INJ ANY COLONIC TUBE W/FLUORO  01/20/2024   IR IMAGING GUIDED PORT INSERTION  02/06/2024   IR REPLC DUODEN/JEJUNO TUBE PERCUT W/FLUORO  02/03/2024   IR REPLC GASTRO/COLONIC TUBE PERCUT W/FLUORO  01/20/2024   LAPAROSCOPIC INSERTION GASTROSTOMY TUBE N/A 01/10/2024   Procedure: laparoscopic insertion of gastrostomy tube, laparoscopic insertion of jejunostomy tube, upper endoscopy;  Surgeon: Kinsinger, Herlene Righter, MD;  Location: [facility];  Service: General;  Laterality: N/A;  Laproscopic insertion   LAPAROSCOPIC ROUX-EN-Y GASTRIC BYPASS WITH HIATAL HERNIA REPAIR N/A 01/02/2024   Procedure: LAPAROSCOPIC CREATION GASTRJEJONOSTOMY;  Surgeon: Lyndel Deward PARAS, MD;  Location: [facility];  Service: General;  Laterality: N/A;  GASTROJEJUNOSTOMY   PORT-A-CATH REMOVAL Left 07/09/2021   Procedure: REMOVAL PORT-A-CATH;  Surgeon: [name], MD;  Location: [facility];  Service: General;  Laterality: Left;   PORTACATH PLACEMENT Left 01/26/2021   Procedure: INSERTION PORT-A-CATH;  Surgeon: [name], MD;  Location: [facility];  Service: General;  Laterality: Left;   RADIOACTIVE SEED GUIDED AXILLARY SENTINEL LYMPH NODE Right 07/09/2021   Procedure: RADIOACTIVE SEED GUIDED RIGHT AXILLARY SENTINEL LYMPH NODE DISSECTION;  Surgeon: [name], MD;  Location: [facility];  Service: General;  Laterality: Right;   TUBAL LIGATION     Family History  Family History  Problem Relation Age of Onset   Breast cancer Mother        81s   Hypertension Father    Uterine cancer Maternal Aunt    Ovarian cancer Maternal Aunt    Breast cancer Maternal Aunt        55s   Breast cancer Cousin 15   Uterine cancer Cousin 50   Colon polyps Neg Hx    Colon cancer Neg Hx    Esophageal cancer Neg Hx    Stomach cancer Neg Hx    Social History  reports that she has never smoked. She has never used smokeless tobacco. She reports that she does not currently use alcohol after a past usage of about 7.0 standard drinks of alcohol per week. She reports that she does not use drugs. Allergies  Allergies  Allergen Reactions   Enhertu  [Fam-Trastuzumab   Deruxtec-Nxki] Other (See Comments)    Headache and chest pain. See progress note from 02/16/2024. Patient able to complete infusion.   Home medications Prior to Admission medications   Medication Sig Start Date End Date Taking? Authorizing Provider  acetaminophen  (TYLENOL ) 325 MG tablet Take 2 tablets (650 mg total) by mouth every 6 (six) hours as needed for mild pain (pain score 1-3) or fever (or Fever >/= 101). 03/09/24  Yes Rosario Leatrice FERNS, MD  calcitRIOL  (ROCALTROL ) 0.25 MCG capsule Take 1 capsule (0.25 mcg total) by mouth 2 (two) times daily. 03/30/24  Yes Causey, Morna Pickle, NP  Calcium  Carb-Cholecalciferol  (CALCIUM  600+D) 600-10 MG-MCG TABS Take 1 tablet by mouth 3 (three) times daily with  meals. 03/30/24  Yes Causey, Morna Pickle, NP  fentaNYL  (DURAGESIC ) 25 MCG/HR Place 1 patch onto the skin every 3 (three) days. 03/11/24  Yes Rosario Leatrice FERNS, MD  lidocaine -prilocaine  (EMLA ) cream Apply to affected area once Patient taking differently: Apply 1 Application topically daily as needed (for port). 02/08/24  Yes [name], [name], MD  magnesium  oxide (MAG-OX) 400 (240 Mg) MG tablet Take 1 tablet (400 mg total) by mouth 2 (two) times daily. 03/28/24 06/26/24 Yes [place], Eric J, DO  ondansetron  (ZOFRAN ) 8 MG tablet Take 1 tablet (8 mg total) by mouth every 8 (eight) hours as needed for nausea or vomiting. Start on the third day after chemotherapy. 02/08/24  Yes [name], [name], MD  oxyCODONE  (ROXICODONE ) 15 MG immediate release tablet Take 1 tablet (15 mg total) by mouth every 4 (four) hours as needed for pain. 03/29/24  Yes [name], [name], NP  prochlorperazine  (COMPAZINE ) 10 MG tablet Take 1 tablet (10 mg total) by mouth every 6 (six) hours as needed for nausea or vomiting. 02/08/24  Yes [name], MD     Vitals:   04/02/24 1531 04/02/24 2020 04/03/24 0511 04/03/24 1254  BP: 108/78 116/79 107/78 105/69  Pulse: 72 73 77 74  Resp: 18 14 12 16   Temp: 97.9 F (36.6 C)  98.4 F (36.9 C) 98 F (36.7 C)  TempSrc:   Oral   SpO2: 97% 100% 99% 99%  Weight:      Height:       Exam Gen alert, no distress Sclera anicteric, throat clear  No jvd or bruits Chest clear bilat to bases RRR no MRG Abd soft ntnd no mass or ascites +bs Ext no LE or UE edema, no other edema Neuro is alert, Ox 3 , nf        Assessment/ Plan: Hypocalcemia: not sure cause, will repeat PTH, get vit D and 1, 25 vit D levels. Not tolerating po CaCO3, will dc and start IV Ca gluconate at 2 gm IV every 6 hrs for now. Goal is serum Ca++ > 7-8.  Will keep per tube 1,25 vit D and stop po CaCO3 for now.  Hypomagnesemia: Mg is low at 1.6, will start IV Mg 2 gm every 8 hrs for now, goal is mid-range or  higher. Will dc per tube magnesium  for now due to N/V.  Volume: looks euvolemic, could be a bit dry. Will start IVF (bicarb) at 75 cc/hr.  Metabolic acidosis: not sure cause, will use bicarb drip at 75 cc/hr.  Breast cancer: w/ peritoneal, bony mets       Myer Fret  MD [facility] 04/03/2024, 4:44 PM  Recent Labs  Lab 03/30/24 0914 04/01/24 1825 04/02/24 0228 04/03/24 0540  HGB 10.0* 9.3*  --  8.7*  ALBUMIN  3.2* 3.3*  --   --  CALCIUM  6.0* 5.4* 5.3* 4.9*  PHOS  --   --  1.5* 1.8*  CREATININE 0.32* 0.31* <0.30* <0.30*  K 3.2* 3.2* 3.2* 4.0   Inpatient medications:  calcitRIOL   0.25 mcg Per Tube BID   calcium  carbonate  800 mg of elemental calcium  Oral TID WC   Chlorhexidine  Gluconate Cloth  6 each Topical Daily   fentaNYL   1 patch Transdermal Q72H   magnesium  oxide  400 mg Per Tube BID   multivitamin with minerals  1 tablet Per Tube Daily   sodium chloride  flush  10-40 mL Intracatheter Q12H   thiamine   100 mg Per Tube Daily    sodium chloride  125 mL/hr at 04/03/24 1331   HYDROmorphone  (DILAUDID ) injection, naLOXone  (NARCAN )  injection, oxyCODONE , prochlorperazine , sodium chloride  flush, trimethobenzamide 

## 2024-04-03 NOTE — Progress Notes (Signed)
 Chelsea Hansen   DOB:1971-04-12   FM#:[id number]   RDW#:[id number]  Subjective: Chelsea Hansen is a 53 year old with mestastic breast cancer to bone, peritoneum, pancreas with duodenal obstruction s/p G tube who was admitted on 10/12 for nausea, vomiting, diarrhea, fall, and electrolyte abnormalities.    She was seen in the cancer clinic on 10/10 and during that visit was not experiencing the above issues.  Today she is lying in bed and notes vomiting ten times.  Her calcium  level is lower today than yesterday at 4.9.  She received IV calcium  today and tolerated it well.  Her mom is at bedside and wants to know if we can check the calcium  and replace it more frequently, and noted concern about inability for patient ot keep oral calcium  tablet down earlier.  Patient is hopeful to be discharged soon so she can receive her cancer treatment.     Objective:  Vitals:   04/03/24 0511 04/03/24 1254  BP: 107/78 105/69  Pulse: 77 74  Resp: 12 16  Temp: 98.4 F (36.9 C) 98 F (36.7 C)  SpO2: 99% 99%    Body mass index is 23.15 kg/m.  Intake/Output Summary (Last 24 hours) at 04/03/2024 1301 Last data filed at 04/03/2024 1200 Gross per 24 hour  Intake 4063.37 ml  Output --  Net 4063.37 ml     Sclerae unicteric  Oropharynx shows no thrush or other lesions  No cervical or supraclavicular adenopathy  Lungs no rales or wheezes--auscultated anterolaterally  Heart regular rate and rhythm  Neuro nonfocal      Labs:   Basic Metabolic Panel: Recent Labs  Lab 03/28/24 0507 03/28/24 1108 03/30/24 0914 04/01/24 1825 04/01/24 2234 04/02/24 0228 04/03/24 0540  NA 136  --  137 138  --  137 136  K 4.0  --  3.2* 3.2*  --  3.2* 4.0  CL 108  --  109 106  --  108 107  CO2 19*  --  22 20*  --  21* 19*  GLUCOSE 83  --  96 96  --  83 87  BUN <5*  --  <5* <5*  --  <5* <5*  CREATININE 0.41*  --  0.32* 0.31*  --  <0.30* <0.30*  CALCIUM  7.9* 8.1* 6.0* 5.4*  --  5.3* 4.9*  MG 1.8  --  1.4*  --  1.6* 1.6*  --    PHOS 2.0*  --   --   --   --  1.5* 1.8*   Liver Function Tests: Recent Labs  Lab 03/28/24 0507 03/30/24 0914 04/01/24 1825  AST 23 14* 22  ALT 37 22 23  ALKPHOS 912* 893* 809*  BILITOT 0.3 0.5 0.4  PROT 4.9* 5.9* 5.7*  ALBUMIN  2.7* 3.2* 3.3*   Recent Labs  Lab 04/01/24 1825  LIPASE 24   CBC: Recent Labs  Lab 03/28/24 0507 03/30/24 0914 04/01/24 1825 04/03/24 0540  WBC 3.1* 3.9* 3.4* 3.2*  NEUTROABS  --  2.1 1.7 1.8  HGB 7.3* 10.0* 9.3* 8.7*  HCT 23.2* 29.1* 28.9* 27.1*  MCV 83.8 77.4* 81.0 81.9  PLT 112* 146* 159 152    D-Dimer Recent Labs    04/02/24 0228  DDIMER 3.20*      Assessment/Plan: 53 y.o. with metastatic breast cancer to bone, peritoneum, pancreas on treatment with Enhertu  admitted with nausea, vomiting, diarrhea, and electrolyte abnormalities.    Metastatic breast cancer: I reviewed with Kiira that right now we are concerned about her electrolyte  abnormalities and that treatment is on hold until we are able to resolve her nausea vomiting and correct her electrolytes. We do not plan on administering Enhertu  while she is admitted.  Electrolyte abnormalities: managed by hospitalist.  I reviewed with Dr. Odean and we consulted nephrology due to persistence of decline.  I am uncertain how much patient is able to absorb through her GI tract with her obstruction, vomiting, previous difficulty with her G tube, and look forward to nephrology recommendations.  Multiple readmissions: I reached out to our [facility] leaders  to better understand what options exist to better support Concepcion in the outpatient setting to prevent recurrent hospitalizations.   Other medical problems, as per hospitalist team, thank you for the excellent care of this patient.    [name], NP 04/03/2024  1:01 PM Medical Oncology [facility] H[facility]ology [address] [place], [place] [address] Tel. [phone]    Fax. [phone]

## 2024-04-03 NOTE — Progress Notes (Signed)
 This RN at bedside for routine line care. Patient stated that port was noted to have briefly had pain at site prior to arrival. Site assessed, no complications noted visibly or on palpation. Informed patient to let nurse know if changes/swelling or other symptoms return. Primary RN, Dr. Raenelle, and L. Crawford, NP notified via securechat.

## 2024-04-03 NOTE — Progress Notes (Addendum)
 Initial Nutrition Assessment  DOCUMENTATION CODES:   Non-severe (moderate) malnutrition in context of chronic illness  INTERVENTION:   Monitor magnesium , potassium, and phosphorus daily for at least 3 days, MD to replete as needed, as pt is at risk for refeeding syndrome. -100 mg Thiamine  daily for at least 5 days  -Ensure Plus High Protein po BID, each supplement provides 350 kcal and 20 grams of protein.   -Boost Breeze po BID with meals, each supplement provides 250 kcal and 9 grams of protein   -Magic cup BID with meals, each supplement provides 290 kcal and 9 grams of protein   -Multivitamin with minerals daily via tube  -48 hour calorie count to start 10/14 -10/15 dinner  - Please document % intake of all foods, drinks, and nutrition supplements patient consumes on meal tickets and place in envelope on patient's door.  - If patient skips/refuses meals please document 0% for that meal.   Tube feeding recommendations: -Initiate [employer] 1.4 @ 20 ml/hr, advance by 10 ml every 12 hours to  goal rate of 70 ml/hr. -Provides 2352 kcals, 104g protein and 1192 ml H2O  NUTRITION DIAGNOSIS:   Moderate Malnutrition related to chronic illness, cancer and cancer related treatments as evidenced by severe muscle depletion, moderate fat depletion.  GOAL:   Patient will meet greater than or equal to 90% of their needs  MONITOR:   PO intake, Supplement acceptance  REASON FOR ASSESSMENT:   Consult Calorie Count, Enteral/tube feeding initiation and management  ASSESSMENT:   53 year old with metastastic breast cancer to bone, peritoneum, pancreas with duodenal obstruction s/p G tube who was admitted on 10/12 for nausea, vomiting, diarrhea, fall, and electrolyte abnormalities.  Patient in room, flat in affect. No family present. Pt reports she ate a little of breakfast but no lunch. Had no plans to order at this time. RD offered but pt declined. Encouraged pt to order dinner today.   Pt reports she was eating at home. Per chart review, pt continues to have N/V. Did not tolerate J-tube feeds in July and August. Ultimately had J-tube removed. Pt reports G-tube continues to leak. Per MD note, pt is in proper position. Explained that G-tube is being used for meds and flushes today. Pt adamant she wants to eat by mouth and does not want to use the tube. Agreeable to Ensure, Boost Breeze and Magic cups. States she will drink Ensure/Boost if she can tolerate it. Encouraged pt to drink supplements in order to prevent use of tube. Per oncology note, there is question of how much pt is able to absorb of what she takes in. Will continue to monitor plan.  Calorie count ordered for next 48 hours. Will leave tube feed recommendations if tube feeds are warranted and if pt agrees. Pt denies any issues with swallowing, chewing or taste changes.   Per weight records, pt weighed 203 lbs on 6/5. Pt has had fluctuating weights since. Current weight of 166 lbs is up from weight on 10/1.  Medications: Calcitrol, Tums, MAG-OX, calcium  gluconate, K-Phos, Compazine , Tigan   Labs reviewed: Low Phos   NUTRITION - FOCUSED PHYSICAL EXAM:  Flowsheet Row Most Recent Value  Orbital Region Mild depletion  Upper Arm Region Moderate depletion  Thoracic and Lumbar Region Moderate depletion  Buccal Region Mild depletion  Temple Region Mild depletion  Clavicle Bone Region Moderate depletion  Clavicle and Acromion Bone Region Moderate depletion  Scapular Bone Region Moderate depletion  Dorsal Hand Moderate depletion  Patellar  Region Severe depletion  Anterior Thigh Region Severe depletion  Posterior Calf Region Severe depletion  Edema (RD Assessment) None  Hair Unable to assess  [covered]  Eyes Reviewed  Mouth Reviewed  Skin Reviewed  Nails Reviewed    Diet Order:   Diet Order             Diet regular Room service appropriate? Yes; Fluid consistency: Thin  Diet effective now                    EDUCATION NEEDS:   Education needs have been addressed  Skin:  Skin Assessment: Skin Integrity Issues: Skin Integrity Issues:: Stage II Stage II: sacrum  Last BM:  10/13  Height:   Ht Readings from Last 1 Encounters:  04/01/24 5' 11 (1.803 m)    Weight:   Wt Readings from Last 1 Encounters:  04/01/24 75.3 kg    BMI:  Body mass index is 23.15 kg/m.  Estimated Nutritional Needs:   Kcal:  2200-2400  Protein:  110-125g  Fluid:  2.2L/day  Morna Lee, MS, RD, LDN Inpatient Clinical Dietitian Contact via Secure chat

## 2024-04-03 NOTE — Plan of Care (Signed)
  Problem: Education: Goal: Knowledge of General Education information will improve Description: Including pain rating scale, medication(s)/side effects and non-pharmacologic comfort measures Outcome: Progressing   Problem: Clinical Measurements: Goal: Will remain free from infection Outcome: Progressing Goal: Diagnostic test results will improve Outcome: Progressing   Problem: Activity: Goal: Risk for activity intolerance will decrease Outcome: Progressing   Problem: Nutrition: Goal: Adequate nutrition will be maintained Outcome: Progressing   Problem: Coping: Goal: Level of anxiety will decrease Outcome: Progressing   Problem: Elimination: Goal: Will not experience complications related to bowel motility Outcome: Progressing Goal: Will not experience complications related to urinary retention Outcome: Progressing   Problem: Pain Managment: Goal: General experience of comfort will improve and/or be controlled Outcome: Progressing   Problem: Safety: Goal: Ability to remain free from injury will improve Outcome: Progressing   Problem: Skin Integrity: Goal: Risk for impaired skin integrity will decrease Outcome: Progressing

## 2024-04-03 NOTE — Progress Notes (Signed)
 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  Recently admitted 9/29-10/8 with similar issues and going home.  This is her third admission in 1 month. In the emergency room hemodynamically stable.  Hemoglobin is stable.  Potassium 3.2, calcium  5.4.  Phosphorus was low.  CT scan with peritoneal carcinomatosis, improved small fluid collection in the left abdominal wall.  Admitted for symptomatic treatment.  Subjective:  Patient seen and examined.  Patient tells me that she cannot take any oral medications including calcium  or oxycodone  so she needs injectables.  She was wondering why she did not get any IV calcium  yesterday.  We discussed about role of scheduled oral medications that can keep up the levels up.  Continues to complain of pain.  She asked to increase the dose of Dilaudid  to every 2 hours and now using Dilaudid  every 2 hours. She is asking about the significance of G-tube and she thinks that is making her sick. Patient's mother on the speaker phone, appropriately asking questions.  Discussed in detail and updated.  She would like to talk to oncology team to get detailed picture about the cancer, treatments and possibilities. I have updated the cancer team and palliative care team for further education, counseling and to formulate plan of care.  I have requested surgical team to evaluate the G-tube.  Likely need to stay.   Assessment & Plan:    Generalized abdominal pain, nausea vomiting.  Cancer related pain.  Peritoneal carcinomatosis: G-tube malfunction, peritoneal cutaneous fistula.  Does not have any evidence of intra-abdominal infection at this time.  Multiple recent investigations and surgical consult recommended not to remove the G-tube.  Reevaluation by surgery today. Symptomatic management, apply dressing and keep G-tube site clean. Eakin's patch to the cutaneous fistula. Adequate oral and IV pain medications-palliative care to follow-up. Adequate nausea medications.  Maintenance fluid today. Continue goal of care discussion with palliative care.  Multiple electrolyte abnormalities: Hypokalemia Hypocalcemia Hypophosphatemia Hypomagnesemia Replace aggressively today.  Monitor levels.  Will need ongoing high-dose oral replacement.  Anemia of chronic disease: Hemoglobin is stable.  Elevated D-dimer: Likely due to underlying cancer.  DVT study is negative.    DVT prophylaxis: Subcu Lovenox .   Code Status: Full code Family Communication: Mother on the phone. Disposition Plan: Status is: Inpatient.  Remains inpatient due to significant pain and unable to eat.     Consultants:  Oncology Palliative  Procedures:  None  Antimicrobials:  None     Objective: Vitals:   04/02/24 1004 04/02/24 1531 04/02/24 2020 04/03/24 0511  BP: 128/85 108/78 116/79 107/78  Pulse: 78 72 73 77  Resp: 14 18 14 12   Temp:  97.9 F (36.6 C)  98.4 F (36.9 C)  TempSrc:    Oral  SpO2: 98% 97% 100% 99%  Weight:      Height:        Intake/Output Summary (Last 24 hours) at 04/03/2024 1051 Last data filed at 04/03/2024 0314 Gross per 24 hour  Intake 3604.62 ml  Output --  Net 3604.62 ml   [employer]  04/01/24 2256  Weight: 75.3 kg    Examination:  General exam: Appears calm and comfortable.  Slightly anxious today but interactive and pleasant to conversation. Respiratory system: Clear to auscultation.  Respiratory effort normal.  No added sounds.  Port-A-Cath present on the right chest wall. Cardiovascular system: S1 & S2 heard, RRR. No JVD, murmurs, rubs, gallops or clicks.  Trace pedal edema both legs. Gastrointestinal system: Soft.  Mildly tender along the epigastrium.  G-tube intact, widened exit site with minimal thin drainage.  Left lateral quadrant cutaneous fistula with serosanguineous drainage, applied Eakin's patch.  No rigidity or guarding.  Bowel sound present. Central nervous system: Alert and oriented. No focal neurological deficits. Extremities: Symmetric 5 x 5 power. Skin: No rashes, lesions or ulcers Psychiatry: Judgement and insight appear normal. Mood & affect appropriate.     Data Reviewed: I have personally reviewed following labs and imaging studies  CBC: Recent Labs  Lab 03/28/24 0507 03/30/24 0914 04/01/24 1825 04/03/24 0540  WBC 3.1* 3.9* 3.4* 3.2*  NEUTROABS  --  2.1 1.7 1.8  HGB 7.3* 10.0* 9.3* 8.7*  HCT 23.2* 29.1* 28.9* 27.1*  MCV 83.8 77.4* 81.0 81.9  PLT 112* 146* 159 152   Basic Metabolic Panel: Recent Labs  Lab 03/28/24 0507 03/28/24 1108 03/30/24 0914 04/01/24 1825 04/01/24 2234 04/02/24 0228 04/03/24 0540  NA 136  --  137 138  --  137 136  K 4.0  --  3.2* 3.2*  --  3.2* 4.0  CL 108  --  109 106  --  108 107  CO2 19*  --  22 20*  --  21* 19*  GLUCOSE 83  --  96 96  --  83 87  BUN <5*  --  <5* <5*  --  <5* <5*  CREATININE 0.41*  --  0.32* 0.31*  --  <0.30* <0.30*  CALCIUM  7.9* 8.1* 6.0* 5.4*  --  5.3* 4.9*  MG 1.8  --  1.4*  --  1.6* 1.6*  --   PHOS 2.0*  --   --   --   --  1.5* 1.8*   GFR: CrCl cannot be calculated (This lab value cannot be used to calculate CrCl because it is not a number: <0.30). Liver Function Tests: Recent Labs  Lab 03/28/24 0507 03/30/24 0914 04/01/24 1825  AST 23 14* 22  ALT 37 22 23  ALKPHOS 912* 893* 809*  BILITOT 0.3 0.5 0.4  PROT 4.9* 5.9* 5.7*  ALBUMIN  2.7* 3.2* 3.3*   Recent Labs  Lab  04/01/24 1825  LIPASE 24   No results for input(s): AMMONIA in the last 168 hours. Coagulation Profile: No results for input(s): INR, PROTIME in the last 168 hours. Cardiac Enzymes: No results for input(s): CKTOTAL, CKMB, CKMBINDEX, TROPONINI in the last 168 hours. BNP (last 3 results) No results for input(s): PROBNP in the last 8760 hours. HbA1C: No results for input(s): HGBA1C in the last 72 hours. CBG: No results for input(s): GLUCAP in the last 168 hours. Lipid Profile: No results for input(s): CHOL, HDL, LDLCALC, TRIG, CHOLHDL, LDLDIRECT in the last 72 hours. Thyroid  Function Tests: No results for input(s): TSH, T4TOTAL, FREET4, T3FREE, THYROIDAB in the last 72 hours. Anemia Panel: No results for input(s): VITAMINB12, FOLATE, FERRITIN, TIBC, IRON, RETICCTPCT in the last 72 hours. Sepsis Labs: No results for input(s): PROCALCITON, LATICACIDVEN in the last 168 hours.  Recent Results (from the past 240 hours)  C Difficile Quick Screen w PCR reflex  Status: None   Collection Time: 04/01/24 10:21 PM   Specimen: STOOL  Result Value Ref Range Status   C Diff antigen NEGATIVE NEGATIVE Final   C Diff toxin NEGATIVE NEGATIVE Final   C Diff interpretation No C. difficile detected.  Final    Comment: Performed at [facility], [address] [place], [place] [address]  Gastrointestinal Panel by PCR , Stool     Status: None   Collection Time: 04/01/24 10:21 PM   Specimen: STOOL  Result Value Ref Range Status   Campylobacter species NOT DETECTED NOT DETECTED Final   Plesimonas shigelloides NOT DETECTED NOT DETECTED Final   Salmonella species NOT DETECTED NOT DETECTED Final   Yersinia enterocolitica NOT DETECTED NOT DETECTED Final   Vibrio species NOT DETECTED NOT DETECTED Final   Vibrio cholerae NOT DETECTED NOT DETECTED Final   Enteroaggregative E coli (EAEC) NOT DETECTED NOT DETECTED Final    Enteropathogenic E coli (EPEC) NOT DETECTED NOT DETECTED Final   Enterotoxigenic E coli (ETEC) NOT DETECTED NOT DETECTED Final   Shiga like toxin producing E coli (STEC) NOT DETECTED NOT DETECTED Final   Shigella/Enteroinvasive E coli (EIEC) NOT DETECTED NOT DETECTED Final   Cryptosporidium NOT DETECTED NOT DETECTED Final   Cyclospora cayetanensis NOT DETECTED NOT DETECTED Final   Entamoeba histolytica NOT DETECTED NOT DETECTED Final   Giardia lamblia NOT DETECTED NOT DETECTED Final   Adenovirus F40/41 NOT DETECTED NOT DETECTED Final   Astrovirus NOT DETECTED NOT DETECTED Final   Norovirus GI/GII NOT DETECTED NOT DETECTED Final   Rotavirus A NOT DETECTED NOT DETECTED Final   Sapovirus (I, II, IV, and V) NOT DETECTED NOT DETECTED Final    Comment: Performed at [facility], [address] [place], [place] [address]         Radiology Studies: VAS US  LOWER EXTREMITY VENOUS (DVT) Result Date: 04/02/2024  Lower Venous DVT Study Patient Name:  CARLINE DURA  Date of Exam:   04/02/2024 Medical Rec #: [id number]      Accession #:    [id number] Date of Birth: [birth year]     Patient Gender: F Patient Age:   53 years Exam Location:  [facility] Procedure:      VAS US  LOWER EXTREMITY VENOUS (DVT) Referring Phys: EDITHA RATHORE --------------------------------------------------------------------------------  Indications: Swelling.  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 Comparison Study: No prior study on file Performing Technologist: Alberta Lis RVS  Examination Guidelines: A complete evaluation includes B-mode imaging, spectral Doppler, color Doppler, and power Doppler as needed of all accessible portions of each vessel. Bilateral testing is considered an integral part of a complete examination. Limited examinations for reoccurring indications may be performed as  noted. The reflux portion of the exam is performed with the patient in reverse Trendelenburg.  +-----+---------------+---------+-----------+----------+--------------+ RIGHTCompressibilityPhasicitySpontaneityPropertiesThrombus Aging +-----+---------------+---------+-----------+----------+--------------+ CFV  Full           Yes      Yes                                 +-----+---------------+---------+-----------+----------+--------------+ SFJ  Full                                                        +-----+---------------+---------+-----------+----------+--------------+   +---------+---------------+---------+-----------+----------+-------------------+  LEFT     CompressibilityPhasicitySpontaneityPropertiesThrombus Aging      +---------+---------------+---------+-----------+----------+-------------------+ CFV      Full           Yes      Yes                                      +---------+---------------+---------+-----------+----------+-------------------+ SFJ      Full                                                             +---------+---------------+---------+-----------+----------+-------------------+ FV Prox  Full           Yes      Yes                                      +---------+---------------+---------+-----------+----------+-------------------+ FV Mid   Full                                                             +---------+---------------+---------+-----------+----------+-------------------+ FV DistalFull                                                             +---------+---------------+---------+-----------+----------+-------------------+ PFV      Full           Yes      Yes                                      +---------+---------------+---------+-----------+----------+-------------------+ POP                     Yes      Yes                  patent by color and                                                        Doppler             +---------+---------------+---------+-----------+----------+-------------------+ PTV      Full                                                             +---------+---------------+---------+-----------+----------+-------------------+ PERO     Full                                                             +---------+---------------+---------+-----------+----------+-------------------+  Summary: RIGHT: - No evidence of common femoral vein obstruction.   LEFT: - There is no evidence of deep vein thrombosis in the lower extremity.  - No cystic structure found in the popliteal fossa.  *See table(s) above for measurements and observations. Electronically signed by Lonni Gaskins MD on 04/02/2024 at 3:47:47 PM.    Final    CT HEAD WO CONTRAST ( ) Result Date: 04/02/2024 CLINICAL DATA:  Recent fall with headaches, initial encounter EXAM: CT HEAD WITHOUT CONTRAST TECHNIQUE: Contiguous axial images were obtained from the base of the skull through the vertex without intravenous contrast. RADIATION DOSE REDUCTION: This exam was performed according to the departmental dose-optimization program which includes automated exposure control, adjustment of the mA and/or kV according to patient size and/or use of iterative reconstruction technique. COMPARISON:  None Available. FINDINGS: Brain: No evidence of acute infarction, hemorrhage, hydrocephalus, extra-axial collection or mass lesion/mass effect. Vascular: No hyperdense vessel or unexpected calcification. Skull: Normal. Negative for fracture or focal lesion. Sinuses/Orbits: No acute finding. Other: None. IMPRESSION: No acute intracranial abnormality noted. Electronically Signed   By: Oneil Devonshire M.D.   On: 04/02/2024 01:22   DG Knee Complete 4 Views Right Result Date: 04/01/2024 CLINICAL DATA:  Right knee pain following fall, initial encounter EXAM: RIGHT KNEE - COMPLETE 4+ VIEW COMPARISON:  None Available. FINDINGS:  No evidence of fracture, dislocation, or joint effusion. No evidence of arthropathy or other focal bone abnormality. Soft tissues are unremarkable. IMPRESSION: No acute abnormality noted. Electronically Signed   By: Oneil Devonshire M.D.   On: 04/01/2024 22:44   CT ABDOMEN PELVIS W CONTRAST Result Date: 04/01/2024 CLINICAL DATA:  Abdominal pain EXAM: CT ABDOMEN AND PELVIS WITH CONTRAST TECHNIQUE: Multidetector CT imaging of the abdomen and pelvis was performed using the standard protocol following bolus administration of intravenous contrast. RADIATION DOSE REDUCTION: This exam was performed according to the departmental dose-optimization program which includes automated exposure control, adjustment of the mA and/or kV according to patient size and/or use of iterative reconstruction technique. CONTRAST:  OMNIPAQUE  IOHEXOL  300 MG/ML  SOLN COMPARISON:  03/05/2024 FINDINGS: Lower chest: Small right pleural effusion. Hepatobiliary: No focal hepatic abnormality. Gallbladder unremarkable. Pancreas: No focal abnormality or ductal dilatation. Spleen: No focal abnormality.  Normal size. Adrenals/Urinary Tract: No suspicious renal or adrenal abnormality. No stones or hydronephrosis. Urinary bladder unremarkable. Stomach/Bowel: Colonic diverticulosis. No active diverticulitis. Gastrostomy tube within the stomach. Small bowel decompressed. No bowel obstruction or inflammatory process. Vascular/Lymphatic: No evidence of aneurysm or adenopathy. Reproductive: Prior hysterectomy.  No adnexal masses. Other: No free fluid or free air. Omental infiltration again noted anteriorly, stable since prior study compatible with peritoneal carcinomatosis. Left catheter tract again noted in the left abdominal wall with small gas and fluid collection in the region of the left oblique muscles, now measuring approximately 1.6 x 1 cm compared to 2.2 by 0.5 cm previously. Musculoskeletal: Diffuse mixed lytic and sclerotic metastatic disease  throughout the osseous structures. This is unchanged. IMPRESSION: Small right pleural effusion. Stable omental infiltration compatible with peritoneal carcinomatosis. Further decreased size of the small fluid collection in the left abdominal wall in the area of the left oblique muscles which appears to communicate with the catheter tract in the left abdominal wall. Colonic diverticulosis. Electronically Signed   By: Franky Crease M.D.   On: 04/01/2024 20:45        Scheduled Meds:  calcitRIOL   0.25 mcg Oral BID   calcium  carbonate  1 tablet Oral BID WC  Chlorhexidine  Gluconate Cloth  6 each Topical Daily   fentaNYL   1 patch Transdermal Q72H   magnesium  oxide  400 mg Oral BID   sodium chloride  flush  10-40 mL Intracatheter Q12H   Continuous Infusions:  potassium PHOSPHATE  IVPB (in mmol) 30 mmol (04/03/24 1024)     LOS: 1 day    Time spent: 55 minutes    Renato Applebaum, MD [facility]

## 2024-04-04 ENCOUNTER — Inpatient Hospital Stay (HOSPITAL_COMMUNITY): Payer: MEDICAID

## 2024-04-04 ENCOUNTER — Other Ambulatory Visit (HOSPITAL_COMMUNITY): Payer: Self-pay

## 2024-04-04 ENCOUNTER — Inpatient Hospital Stay: Payer: MEDICAID

## 2024-04-04 ENCOUNTER — Inpatient Hospital Stay: Payer: MEDICAID | Admitting: Hematology and Oncology

## 2024-04-04 ENCOUNTER — Other Ambulatory Visit: Payer: Self-pay

## 2024-04-04 ENCOUNTER — Encounter: Payer: Self-pay | Admitting: Hematology and Oncology

## 2024-04-04 DIAGNOSIS — E440 Moderate protein-calorie malnutrition: Secondary | ICD-10-CM | POA: Insufficient documentation

## 2024-04-04 DIAGNOSIS — Z980 Intestinal bypass and anastomosis status: Secondary | ICD-10-CM

## 2024-04-04 DIAGNOSIS — R112 Nausea with vomiting, unspecified: Secondary | ICD-10-CM | POA: Diagnosis not present

## 2024-04-04 DIAGNOSIS — K529 Noninfective gastroenteritis and colitis, unspecified: Secondary | ICD-10-CM

## 2024-04-04 DIAGNOSIS — C786 Secondary malignant neoplasm of retroperitoneum and peritoneum: Secondary | ICD-10-CM

## 2024-04-04 DIAGNOSIS — E878 Other disorders of electrolyte and fluid balance, not elsewhere classified: Secondary | ICD-10-CM | POA: Diagnosis not present

## 2024-04-04 LAB — RENAL FUNCTION PANEL
Albumin: 2.9 g/dL — ABNORMAL LOW (ref 3.5–5.0)
Anion gap: 9 (ref 5–15)
BUN: <5 mg/dL — ABNORMAL LOW (ref 6–20)
CO2: 22 mmol/L (ref 22–32)
Calcium: 5.3 mg/dL — CL (ref 8.9–10.3)
Chloride: 106 mmol/L (ref 98–111)
Creatinine, Ser: <0.3 mg/dL — ABNORMAL LOW (ref 0.44–1.00)
Glucose, Bld: 78 mg/dL (ref 70–99)
Phosphorus: 2.2 mg/dL — ABNORMAL LOW (ref 2.5–4.6)
Potassium: 4.1 mmol/L (ref 3.5–5.1)
Sodium: 137 mmol/L (ref 135–145)

## 2024-04-04 LAB — MAGNESIUM: Magnesium: 2 mg/dL (ref 1.7–2.4)

## 2024-04-04 LAB — BASIC METABOLIC PANEL WITH GFR
Anion gap: 11 (ref 5–15)
BUN: <5 mg/dL — ABNORMAL LOW (ref 6–20)
CO2: 22 mmol/L (ref 22–32)
Calcium: 6 mg/dL — CL (ref 8.9–10.3)
Chloride: 105 mmol/L (ref 98–111)
Creatinine, Ser: <0.3 mg/dL — ABNORMAL LOW (ref 0.44–1.00)
Glucose, Bld: 113 mg/dL — ABNORMAL HIGH (ref 70–99)
Potassium: 4 mmol/L (ref 3.5–5.1)
Sodium: 137 mmol/L (ref 135–145)

## 2024-04-04 LAB — PARATHYROID HORMONE, INTACT (NO CA): PTH: 207 pg/mL — ABNORMAL HIGH (ref 15–65)

## 2024-04-04 LAB — VITAMIN D 25 HYDROXY (VIT D DEFICIENCY, FRACTURES): Vit D, 25-Hydroxy: 7.95 ng/mL — ABNORMAL LOW (ref 30–100)

## 2024-04-04 MED ORDER — SODIUM PHOSPHATES 45 MMOLE/15ML IV SOLN
30.0000 mmol | Freq: Once | INTRAVENOUS | Status: AC
  Administered 2024-04-04: 30 mmol via INTRAVENOUS
  Filled 2024-04-04: qty 10

## 2024-04-04 MED ORDER — PROCHLORPERAZINE EDISYLATE 10 MG/2ML IJ SOLN
10.0000 mg | Freq: Four times a day (QID) | INTRAMUSCULAR | Status: DC
  Administered 2024-04-04: 10 mg via INTRAVENOUS
  Filled 2024-04-04: qty 2

## 2024-04-04 MED ORDER — PROCHLORPERAZINE EDISYLATE 10 MG/2ML IJ SOLN
10.0000 mg | Freq: Four times a day (QID) | INTRAMUSCULAR | Status: DC | PRN
  Administered 2024-04-04 – 2024-04-19 (×26): 10 mg via INTRAVENOUS
  Filled 2024-04-04 (×26): qty 2

## 2024-04-04 MED ORDER — BOOST / RESOURCE BREEZE PO LIQD CUSTOM
1.0000 | Freq: Three times a day (TID) | ORAL | Status: DC
  Administered 2024-04-04 – 2024-04-10 (×10): 1 via ORAL

## 2024-04-04 MED ORDER — SODIUM CHLORIDE 0.9 % IV SOLN
3.0000 g | Freq: Four times a day (QID) | INTRAVENOUS | Status: AC
Start: 2024-04-04 — End: 2024-04-05
  Administered 2024-04-04 – 2024-04-05 (×2): 3 g via INTRAVENOUS
  Filled 2024-04-04 (×4): qty 30

## 2024-04-04 MED ORDER — FENTANYL 12 MCG/HR TD PT72
1.0000 | MEDICATED_PATCH | TRANSDERMAL | Status: DC
  Administered 2024-04-04 – 2024-04-07 (×2): 1 via TRANSDERMAL
  Filled 2024-04-04 (×2): qty 1

## 2024-04-04 MED ORDER — KATE FARMS STANDARD 1.4 PO LIQD
325.0000 mL | Freq: Two times a day (BID) | ORAL | Status: DC
  Administered 2024-04-04 – 2024-04-10 (×7): 325 mL via ORAL
  Filled 2024-04-04 (×20): qty 325

## 2024-04-04 MED ORDER — OLANZAPINE 5 MG PO TBDP
5.0000 mg | ORAL_TABLET | Freq: Every day | ORAL | Status: DC
Start: 2024-04-04 — End: 2024-04-13
  Administered 2024-04-04 – 2024-04-12 (×9): 5 mg via ORAL
  Filled 2024-04-04 (×9): qty 1

## 2024-04-04 MED ORDER — DEXAMETHASONE SOD PHOSPHATE PF 10 MG/ML IJ SOLN
4.0000 mg | Freq: Two times a day (BID) | INTRAMUSCULAR | Status: DC
  Administered 2024-04-04 – 2024-04-09 (×11): 4 mg via INTRAVENOUS

## 2024-04-04 MED ORDER — PANTOPRAZOLE SODIUM 40 MG IV SOLR
40.0000 mg | Freq: Two times a day (BID) | INTRAVENOUS | Status: DC
  Administered 2024-04-04 – 2024-04-05 (×3): 40 mg via INTRAVENOUS
  Filled 2024-04-04 (×3): qty 10

## 2024-04-04 MED ORDER — HYDROMORPHONE HCL 1 MG/ML IJ SOLN
1.0000 mg | INTRAMUSCULAR | Status: DC | PRN
  Administered 2024-04-04 – 2024-04-05 (×8): 2 mg via INTRAVENOUS
  Administered 2024-04-05: 1 mg via INTRAVENOUS
  Administered 2024-04-05: 2 mg via INTRAVENOUS
  Administered 2024-04-06: 1 mg via INTRAVENOUS
  Administered 2024-04-06: 2 mg via INTRAVENOUS
  Administered 2024-04-06 – 2024-04-07 (×4): 1 mg via INTRAVENOUS
  Administered 2024-04-07: 2 mg via INTRAVENOUS
  Administered 2024-04-08 – 2024-04-09 (×5): 1 mg via INTRAVENOUS
  Administered 2024-04-09 (×2): 2 mg via INTRAVENOUS
  Filled 2024-04-04: qty 1
  Filled 2024-04-04 (×3): qty 2
  Filled 2024-04-04: qty 1
  Filled 2024-04-04 (×3): qty 2
  Filled 2024-04-04: qty 1
  Filled 2024-04-04: qty 2
  Filled 2024-04-04: qty 1
  Filled 2024-04-04 (×3): qty 2
  Filled 2024-04-04 (×3): qty 1
  Filled 2024-04-04: qty 2
  Filled 2024-04-04 (×3): qty 1
  Filled 2024-04-04 (×2): qty 2
  Filled 2024-04-04 (×2): qty 1

## 2024-04-04 NOTE — Consult Note (Signed)
 Palliative Care Consult Note                                  Date: 04/04/2024   Patient Name: Chelsea Hansen  DOB:[birth year]  FMW:[id number]  Age / Sex:53 y.o., female  PCP: [name], NP Referring Physician: Tobie Yetta CHRISTELLA, MD  Reason for Consultation: Establishing goals of care, Non pain symptom management, and Pain control  Past Medical History:  Diagnosis Date   Allergy    seasonal allergies   Anxiety    on meds   Asthma    uses inhaler   Breast cancer (HCC) 2012   RIGHT lumpectomy   Cancer (HCC) 2022   RIGHT breast lump-dx 2022   Depression    on meds   DVT (deep venous thrombosis) (HCC) 2010   after hysterectomy   Family history of uterine cancer    GERD (gastroesophageal reflux disease)    with certain foods/OTC PRN meds   Headache(784.0)    History of radiation therapy    Bilateral breast- 09/03/21-11/02/21- Dr. Lynwood Nasuti   Hyperlipidemia    on meds   Hypertension    on meds   SVT (supraventricular tachycardia)      Assessment & Plan:   HPI/Patient Profile: 53 y.o. female  with past medical history of breast cancer with bone metastasis, peritoneal carcinomatosis, possible pancreatic metastasis with pancreatic head lesion with duodenal obstruction s/p G-tube (12/2023) chronic cancer pain, and anxiety admitted on 04/01/2024 with electrolyte derangement with a fall and headstrike 2/2 nausea and vomiting. Palliative consulted for goals of care conversation and symptom management.   Patient with 8 admissions in the last 6 months. With 4 admissions in the last month for N/V/D and electrolyte derangement. Current work up negative for acute intracranial process. Patient's current nausea and vomiting most likely multifactorial including, denosumab  use hypocalcemia (5.3) and peritoneal carcinomatosis. CT scan suggest stable abdomen, but continued nausea and vomiting most likely from the peritoneal  carcinomatosis rather than an acute issue.   Palliative medicine is specialized medical care for people living with serious illness. It focuses on providing relief from the symptoms and stress of a serious illness. The goal is to improve quality of life for both the patient and the family.  Goals of care: Broad aims of medical therapy in relation to the patient's values and preferences. Our aim is to provide medical care aimed at enabling patients to achieve the goals that matter most to them, given the circumstances of their particular medical situation and their constraints.    # Nausea with electrolyte derangement Multifactorial in etiology hypocalcemia likely 2/2 denosumab  and N/V from peritoneal carcinomatosis as seen on recent CT abdomen with omental infiltration and peritoneal carcinomatosis.  - Zyprexa  5 mg nightly once QTc known.  - Monitor QTC with EKG, QTc interval was 497 on EKG from 04-01-24. Repeat EKG ordered and pending.  - Consider Octreotide infusion if QTC prolonged instead of Zyprexa  - Decadron  4 mg IV Q12  # Goals of Care - Plan to further discuss goals of care when patient's symptoms have stabilized  SUMMARY OF RECOMMENDATIONS   Additional 12 mcg fentanyl  patch, total dose of 37 mcg for pain management Dilaudid  1-2 mg PRN Q2 for pain Zyprexa  5 mg nightly for nausea if EKG shows similar QTC, consider Octreotide for nausea if QTC prolonged Decadron  4 mg IV Q12 Continue goals of care conversation  Symptom  Management:  Fentanyl  patch 12 mcg + 25 mcg patch for pain Dilaudid  1-2 mg PRN Q2 for pain Zyprexa  5 mg nightly for nausea Decadron  4 mg IV Q12  Code Status: Full Code  Prognosis:  Unable to determine  Discharge Planning:  To Be Determined   Discussed with:   Grady RN  Subjective:   Reviewed medical records, received report from team, assessed the patient and then meet at the patient's bedside to discuss diagnosis, prognosis, GOC, EOL wishes  disposition and options.  Before meeting with the patient/family, I spent time reviewing the chart notes including previous discharge notes, H&P, oncology notes, and previous palliative notes. I also reviewed vital signs, nursing flowsheets, medication administrations record, labs, and imaging. Labs/imaging reviewed include CT A/P, calcium .  I met with the patient, no visitors bedside.   We meet to discuss diagnosis prognosis, GOC, EOL wishes, disposition and options. Concept of Palliative Care was introduced as specialized medical care for people and their families living with serious illness.  If focuses on providing relief from the symptoms and stress of a serious illness.  The goal is to improve quality of life for both the patient and the family. Values and goals of care important to patient and family were attempted to be elicited.  Created space and opportunity for patient  and family to explore thoughts and feelings regarding current medical situation   Natural trajectory and current clinical status were discussed. Questions and concerns addressed. Patient encouraged to call with questions or concerns.  Patient complains of uncontrolled pain, ongoing fatigue, has been sleeping more, has not been eating well. She is worried about losing more weight, she is tired of having uncontrolled symptoms that keep bringing her back to the hospital.   Today's Discussion: - Discussed patient's pain and nausea, patient was appreciative of efforts to make her feel better and agreeable to plan - Educated patient on her current hospitalization and the processes involved that is causing her current electrolyte derangement and nausea/vomiting  Goals: - Improve her pain and control her nausea  Review of Systems  Constitutional:  Positive for appetite change and fatigue.  Gastrointestinal:  Positive for nausea and vomiting.    Objective:   Primary Diagnoses: Present on Admission:  Electrolyte  abnormality  Generalized abdominal pain   Vital Signs:  BP 96/70 (BP Location: Left Arm)   Pulse 74   Temp 98 F (36.7 C)   Resp 20   Ht 5' 11 (1.803 m)   Wt 75.2 kg   SpO2 100%   BMI 23.12 kg/m   Physical Exam Constitutional:      Appearance: She is ill-appearing.  HENT:     Head: Normocephalic.  Eyes:     Extraocular Movements: Extraocular movements intact.  Pulmonary:     Effort: Pulmonary effort is normal.  Neurological:     General: No focal deficit present.     Mental Status: She is alert.  Psychiatric:        Mood and Affect: Mood is depressed.     Palliative Assessment/Data: 70%     Thank you for allowing us  to participate in the care of Chelsea Hansen PMT will continue to support holistically.  Billing based on MDM: High Problems Addressed: One or more chronic illnesses with severe exacerbation, progression, or side effects of treatment.  Amount and/or Complexity of Data: Category 1:Review of prior external note(s) from each unique source, Review of the result(s) of each unique test, and Assessment requiring an  independent historian(s) and Category 3:Discussion of management or test interpretation with external physician/other qualified health care professional/appropriate source (not separately reported)  Risks: Parenteral controlled substances and Drug therapy requiring intensive monitoring for toxicity  Detailed review of medical records (labs, imaging, vital signs), medically appropriate exam, discussed with treatment team, counseling and education to patient, family, & staff, documenting clinical information, medication management, coordination of care.  Signed by: Lonia Serve MD.  Seen alongside Fairy FORBES Shan DEVONNA Palliative Medicine Team  Team Phone # [phone] (Nights/Weekends)  04/04/2024, 3:13 PM

## 2024-04-04 NOTE — Plan of Care (Signed)
 Unfortunately patient has not been able to tolerate oral intake without significant vomiting and nausea. Patient's electrolyte imbalances are not significantly improved. Patient reports that aquathermia (K-pad) has been a beneficial therapy to help manage her abdominal pain. Patient remains independent with ADLs.    Problem: Clinical Measurements: Goal: Diagnostic test results will improve Outcome: Not Progressing   Problem: Nutrition: Goal: Adequate nutrition will be maintained Outcome: Not Progressing   Problem: Nutrition Goal: Nutritional status is improving Description: Monitor and assess patient for malnutrition (ex- brittle hair, bruises, dry skin, pale skin and conjunctiva, muscle wasting, smooth red tongue, and disorientation). Collaborate with interdisciplinary team and initiate plan and interventions as ordered.  Monitor patient's weight and dietary intake as ordered or per policy. Utilize nutrition screening tool and intervene per policy. Determine patient's food preferences and provide high-protein, high-caloric foods as appropriate.  Outcome: Not Progressing   Problem: Education: Goal: Knowledge of General Education information will improve Description: Including pain rating scale, medication(s)/side effects and non-pharmacologic comfort measures Outcome: Progressing   Problem: Health Behavior/Discharge Planning: Goal: Ability to manage health-related needs will improve Outcome: Progressing   Problem: Clinical Measurements: Goal: Ability to maintain clinical measurements within normal limits will improve Outcome: Progressing Goal: Will remain free from infection Outcome: Progressing Goal: Respiratory complications will improve Outcome: Progressing Goal: Cardiovascular complication will be avoided Outcome: Progressing   Problem: Activity: Goal: Risk for activity intolerance will decrease Outcome: Progressing   Problem: Coping: Goal: Level of anxiety will  decrease Outcome: Progressing   Problem: Elimination: Goal: Will not experience complications related to bowel motility Outcome: Progressing Goal: Will not experience complications related to urinary retention Outcome: Progressing   Problem: Pain Managment: Goal: General experience of comfort will improve and/or be controlled Outcome: Progressing   Problem: Safety: Goal: Ability to remain free from injury will improve Outcome: Progressing   Problem: Skin Integrity: Goal: Risk for impaired skin integrity will decrease Outcome: Progressing

## 2024-04-04 NOTE — Plan of Care (Signed)
  Problem: Education: Goal: Knowledge of General Education information will improve Description: Including pain rating scale, medication(s)/side effects and non-pharmacologic comfort measures Outcome: Progressing   Problem: Health Behavior/Discharge Planning: Goal: Ability to manage health-related needs will improve Outcome: Progressing   Problem: Clinical Measurements: Goal: Will remain free from infection Outcome: Progressing Goal: Diagnostic test results will improve Outcome: Progressing   Problem: Activity: Goal: Risk for activity intolerance will decrease Outcome: Progressing   Problem: Nutrition: Goal: Adequate nutrition will be maintained Outcome: Progressing   Problem: Coping: Goal: Level of anxiety will decrease Outcome: Progressing   Problem: Elimination: Goal: Will not experience complications related to bowel motility Outcome: Progressing Goal: Will not experience complications related to urinary retention Outcome: Progressing   Problem: Pain Managment: Goal: General experience of comfort will improve and/or be controlled Outcome: Progressing   Problem: Safety: Goal: Ability to remain free from injury will improve Outcome: Progressing   Problem: Skin Integrity: Goal: Risk for impaired skin integrity will decrease Outcome: Progressing   Problem: Nutrition Goal: Nutritional status is improving Description: Monitor and assess patient for malnutrition (ex- brittle hair, bruises, dry skin, pale skin and conjunctiva, muscle wasting, smooth red tongue, and disorientation). Collaborate with interdisciplinary team and initiate plan and interventions as ordered.  Monitor patient's weight and dietary intake as ordered or per policy. Utilize nutrition screening tool and intervene per policy. Determine patient's food preferences and provide high-protein, high-caloric foods as appropriate.  Outcome: Progressing

## 2024-04-04 NOTE — Progress Notes (Signed)
 [facility] Progress Note Patient: Chelsea Hansen FMW:[id number] DOB: [birth year]  DOA: 04/01/2024 DOS: the patient was seen and examined on 04/04/2024  Brief Hospital Course: Patient with PMH of right-sided breast cancer, CKD 3A, SVT, HTN, esophagitis, esophageal stricture, duodenal obstruction presented to the hospital with complaints of abdominal pain with nausea and vomiting. Found to have severe electrolyte abnormalities as well. GI, nephrology, oncology and palliative care following.  Assessment and Plan: Intractable nausea and vomiting. CT abdomen without any evidence of obstruction. GI consulted. Palliative care also consulted. Decadron  recommended by palliative care for refractory nausea and vomiting. May consider updated infusion. On Phenergan . Zyprexa  recommended. GI also consulted will monitor recommendation. Initiating PPI.  Moderate protein calorie malnutrition. Secondary poor p.o. intake due to ongoing GI symptoms. Significant concern for patient's prognosis. Patient unable to tolerate tube feeding via tubes in the past. 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 Patient does not use G-tube for nutrition. Due to noted nausea and vomiting has poor p.o. intake. Will monitor for improvement in symptoms. Perative care consult following.  Acute on chronic abdominal pain related to cancer. Patient is on fentanyl  patch chronically. Currently requiring IV Dilaudid . Will follow-up Perative care recommendation.  Severe hypokalemia, severe hypocalcemia, hypophosphatemia. Hypomagnesemia. Currently being treated aggressively. Appreciate oncology consultation. It appears that the patient received denosumab  in the past which could be related to her hypercalcemia.  Goals of care conversation. Appreciate particular consultation.  Several monitor.  Subjective: Reported nausea.  Had an episode of vomiting large-volume  without any blood at the time of my evaluation.  Also ongoing abdominal pain she reports this is around her PEG tube site.  Passing gas.  Denies any chest pain or shortness of breath.  Physical Exam: Clear to auscultation. S1-S2 present Diffuse abdominal pain. Bilateral trace edema. No focal deficit.  Data Reviewed: I have Reviewed nursing notes, Vitals, and Lab results. Since last encounter, pertinent lab results CBC BMP   . I have ordered test including CBC and BMP  . I have discussed pt's care plan and test results with GI  .   Disposition: Status is: Inpatient Remains inpatient appropriate because: Monitor for improvement in oral intake.  Family Communication: No one at bedside Level of care: Telemetry continue Vitals:   04/04/24 0458 04/04/24 0500 04/04/24 0617 04/04/24 1216  BP: 105/71   96/70  Pulse: 77   74  Resp: 18   20  Temp: 98.5 F (36.9 C)   98 F (36.7 C)  TempSrc: Oral     SpO2: 96%   100%  Weight:  75.2 kg 75.2 kg   Height:         Author: Yetta Blanch, MD 04/04/2024 6:16 PM  Please look on [email or web address] to find out who is on call.

## 2024-04-04 NOTE — Consult Note (Addendum)
 Referring Provider: Dr. Yetta Blanch Primary Care Physician:  [name], NP Primary Gastroenterologist: Dr. Legrand and Dr. Wilhelmenia  Reason for Consultation: Intractable nausea and vomiting  HPI: Chelsea Hansen is a 53 y.o. female with a history of metastatic breast cancer with peritoneal carcinomatosis, chronic kidney disease stage IIIa, SVT, asthma, hypertension, esophagitis, esophageal stricture, gastritis, duodenitis, duodenal stricture s/p dilatation, duodenal obstruction (previous concern for pancreatic lesion could not able to be visualized due to duodenal stenosis) leading to gastrojejunostomy bypass surgery 12/2023 with complications of abdominal fluid collections with numerous hospital admissions with intractable nausea and vomiting.   She was most recently admitted 9/29 - 03/28/2024 with persistent intractable nausea and vomiting despite taking antiemetics at home.  Admission labs showed persistent severe hypocalcemia, hypomagnesia and hypokalemia.  She also demonstrated acute on chronic anemia secondary to malignancy and oral chemotherapy and was transfused 2 units of PRBCs during this hospitalization.  he also undewent to a celiac plexus block for chronic abdominal pain per IR 03/26/2024.  She was readmitted to the hospital 04/01/2024 with intractable nausea, vomiting and diarrhea. Admission labs showed a WBC count of 3.4.  Hemoglobin 9.3 down from 10 five days prior to admission.  Hematocrit 28.9.  Platelets 159.  Sodium 138.  Potassium 3.2.  BUN < 5.  Creatinine 0.31.  Calcium  5.4.  Albumin  3.3.  Total bili 0.4.  Alk phos 809 down from 893 five days prior to admission.  AST 22.  ALT 23.  Lipase 24.  Magnesium  1.6.  C. difficile toxin/antigen negative.  GI pathogen panel negative.  CTAP with contrast showed stable omental infiltration consistent with history of peritoneal carcinomatosis, further decreased size in the small fluid collection in left abdominal wall, colonic  diverticulosis and a small right pleural effusion. The pancreas was normal.  Labs 04/02/2024: Potassium 3.2.  Calcium  5.3.  Magnesium  1.6.  Labs today: BUN < 5.  Creatinine < .30.  Calcium  5.3.  Phosphorus 2.2.  Albumin  2.9.  A GI consult was requested for further evaluation regarding intractable nausea and vomiting.  Patient has a prolonged QTc interval which limits antiemetic use. She is currently receiving Compazine  10 mL grams every 6 hours as needed. She endorses having persistent nausea and vomiting for at least the past 3 months, no significant improvement following her most recent hospital discharge and/01/2024.  She sometimes has nausea but most of the time she abruptly vomits without warning which can occur randomly before, during or after eating.  She ate eggs, bagel and potatoes for breakfast this morning which stayed down.  Earlier this afternoon she vomited a large amount of nonbloody liquid x 2 episodes.  She stated vomiting 5 times yesterday.  She has chronic central to LUQ pain surrounding the G-tube area which comes and goes and she intermittently has lower abdominal pain.  Her stools are always loose, no bloody or black stools.  She endorses losing 50 pounds over the past 3 to 4 months. No NSAID use.  Non-smoker.  No alcohol use.   Her most recent EGD during prior hospitalization 03/08/2024 showed a patent gastrostomy with a G-tube and gastrojejunostomy with inflammation, congestion and erosion.  The stomach was otherwise normal and no lesions in the duodenal bulb and an acquired duodenal stenosis in the duodenal sweep was present which did not allow the endoscope/duodenoscope to traverse.  An EUS was done at the same time which showed a dilated PD in the pancreatic head and genu of the pancreas, dilatation of the  CBD and common hepatic duct measuring up to 14 mm and ascites was found in the peritoneal cavity (hepatic/periduodenal) and the endoscope could not be advanced to visualize the  major papilla/ampulla/uncinate process of the pancreas secondary to duodenal stenosis.     MOST RECENT GI PROCEDURES:  EGD 03/08/2024: - No gross lesions in the proximal esophagus and in the mid esophagus. LA Grade B esophagitis with no bleeding noted distally (this is better than prior).  - Patent gastrostomy with no G-tube present characterized by healthy appearing mucosa.  - A gastrojejunostomy was found, characterized by inflammation, congestion, erosion and an intact staple line (at staple line that was visualized there were congested erosive changes which is a reason that 7 of the staples were removed).  - Normal antrum, prepyloric region of the stomach and pylorus.  - No gross lesions in the duodenal bulb.  - Acquired duodenal stenosis in the duodenal sweep (this does not allow endoscope or duodenoscope or echoendoscope to traverse).   Endoscopic ultrasound 03/08/2024: - Pancreatic parenchymal abnormalities consisting of hyperechoic strands were noted in the pancreatic head.  - The pancreatic duct had a dilated endosonographic appearance in the pancreatic head and genu of the pancreas. The pancreatic duct measured up to 4.0 mm in diameter in the head and neck.  - There was dilation in the common bile duct and in the common hepatic duct which measured up to 14 mm.  - Ascites was found on endosonographic examination of the peritoneal cavity (perihepatic/periduodenal).  - Unfortunately I cannot position the endoscope to be able to visualize the major papilla/ampulla/uncinate process of the pancreas. This is a result of the significant duodenal stenosis in the duodenal sweep/angle.  EGD/EUS 12/28/2023:   EGD impression:  - No gross lesions in the proximal esophagus.  - LA Grade C esophagitis with no bleeding found distally (appears improved from prior).  - Z-line irregular, 39 cm from the incisors.  - 3 cm hiatal hernia.  - Retained gastric fluid. 900 cc removed on today's procedure.  -  J-shaped and dilation deformity in the entire stomach. - Erythematous mucosa in the stomach. Previously biopsied and negative for HP.  - No gross lesions in the duodenal bulb.  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 Biopsied.    EUS impression:  - Pancreatic parenchymal abnormalities consisting of diffuse echogenicity and lobularity were noted in the pancreatic head, genu of the pancreas, pancreatic body and pancreatic tail. No overt mass/lesion was noted in this particular regions visualized. Uncinate process not able to be visualized.  - The pancreatic duct within the head was prominent and was dilated in the genu of the pancreas, body of the pancreas and tail of the pancreas.  - There was a normal-appearing common bile duct and prominent/slightly dilated common hepatic duct.  - No malignant-appearing lymph nodes were visualized in the celiac region (level 20), peripancreatic region and porta hepatis region.   EGD 12/27/2023: - No gross lesions in the proximal esophagus.  - LA Grade C esophagitis with no bleeding found distally.  - 2 cm hiatal hernia.  - Retained gastric fluid.  - Erythematous mucosa in the stomach. No change from yesterday.  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 Dilated under fluoroscopy up to 12 mm. It is felt that this duodenal stenosis is greater than 5 cm in length. Unable to traverse even after dilation.   Past Medical History:  Diagnosis Date  Allergy    seasonal allergies   Anxiety    on meds   Asthma    uses inhaler   Breast cancer (HCC) 2012   RIGHT lumpectomy   Cancer (HCC) 2022   RIGHT breast lump-dx 2022   Depression    on meds   DVT (deep venous thrombosis) (HCC) 2010   after hysterectomy   Family history of uterine cancer    GERD (gastroesophageal reflux disease)    with certain foods/OTC PRN meds   Headache(784.0)    History of radiation therapy     Bilateral breast- 09/03/21-11/02/21- Dr. Lynwood Nasuti   Hyperlipidemia    on meds   Hypertension    on meds   SVT (supraventricular tachycardia)     Past Surgical History:  Procedure Laterality Date   ABDOMINAL HYSTERECTOMY     AXILLARY SENTINEL NODE BIOPSY Left 07/09/2021   Procedure: LEFT AXILLARY SENTINEL NODE BIOPSY;  Surgeon: [name], MD;  Location: [facility];  Service: General;  Laterality: Left;   BALLOON DILATION N/A 12/13/2023   Procedure: MERRILL HODGKIN;  Surgeon: Federico Rosario BROCKS, MD;  Location: [facility];  Service: Gastroenterology;  Laterality: N/A;   BIOPSY  06/29/2022   Procedure: BIOPSY;  Surgeon: Legrand Victory LITTIE DOUGLAS, MD;  Location: [facility];  Service: Gastroenterology;;   BIOPSY OF SKIN SUBCUTANEOUS TISSUE AND/OR MUCOUS MEMBRANE  12/13/2023   Procedure: BIOPSY, SKIN, SUBCUTANEOUS TISSUE, OR MUCOUS MEMBRANE;  Surgeon: Federico Rosario BROCKS, MD;  Location[facility];  Service: Gastroenterology;;   BREAST EXCISIONAL BIOPSY Right 09/16/2009   BREAST LUMPECTOMY WITH RADIOACTIVE SEED LOCALIZATION Bilateral 07/09/2021   Procedure: BILATERAL BREAST LUMPECTOMY WITH RADIOACTIVE SEED LOCALIZATION;  Surgeon: [name], MD;  Location: [facility];  Service: General;  Laterality: Bilateral;   BREAST SURGERY     lumpectomy   ESOPHAGOGASTRODUODENOSCOPY N/A 12/13/2023   Procedure: EGD (ESOPHAGOGASTRODUODENOSCOPY);  Surgeon: Federico Rosario BROCKS, MD;  Location: [facility] ENDOSCOPY;  Service: Gastroenterology;  Laterality: N/A;   ESOPHAGOGASTRODUODENOSCOPY N/A 12/26/2023   Procedure: EGD (ESOPHAGOGASTRODUODENOSCOPY);  Surgeon: Wilhelmenia Aloha Raddle., MD;  Location: [facility];  Service: Gastroenterology;  Laterality: N/A;   ESOPHAGOGASTRODUODENOSCOPY N/A 12/27/2023   Procedure: EGD (ESOPHAGOGASTRODUODENOSCOPY);  Surgeon: Wilhelmenia Aloha Raddle., MD;  Location: [facility];  Service: Gastroenterology;  Laterality: N/A;   ESOPHAGOGASTRODUODENOSCOPY N/A  03/08/2024   Procedure: EGD (ESOPHAGOGASTRODUODENOSCOPY);  Surgeon: Wilhelmenia Aloha Raddle., MD;  Location: [facility];  Service: Gastroenterology;  Laterality: N/A;   ESOPHAGOGASTRODUODENOSCOPY (EGD) WITH PROPOFOL  N/A 06/29/2022   Procedure: ESOPHAGOGASTRODUODENOSCOPY (EGD) WITH PROPOFOL ;  Surgeon: Legrand Victory LITTIE DOUGLAS, MD;  Location: [facility];  Service: Gastroenterology;  Laterality: N/A;   EUS N/A 12/26/2023   Procedure: ULTRASOUND, UPPER GI TRACT, ENDOSCOPIC;  Surgeon: Wilhelmenia Aloha Raddle., MD;  Location: [facility];  Service: Gastroenterology;  Laterality: N/A;   EUS N/A 12/27/2023   Procedure: ULTRASOUND, UPPER GI TRACT, ENDOSCOPIC;  Surgeon: Wilhelmenia Aloha Raddle., MD;  Location: [facility];  Service: Gastroenterology;  Laterality: N/A;   EUS N/A 03/08/2024   Procedure: ULTRASOUND, UPPER GI TRACT, ENDOSCOPIC;  Surgeon: Wilhelmenia Aloha Raddle., MD;  Location: [facility];  Service: Gastroenterology;  Laterality: N/A;   IR CM INJ ANY COLONIC TUBE W/FLUORO  01/20/2024   IR IMAGING GUIDED PORT INSERTION  02/06/2024   IR REPLC DUODEN/JEJUNO TUBE PERCUT W/FLUORO  02/03/2024   IR REPLC GASTRO/COLONIC TUBE PERCUT W/FLUORO  01/20/2024   LAPAROSCOPIC INSERTION GASTROSTOMY TUBE N/A 01/10/2024   Procedure: laparoscopic insertion of gastrostomy tube, laparoscopic insertion of jejunostomy tube, upper  endoscopy;  Surgeon: Kinsinger, Herlene Righter, MD;  Location: [facility];  Service: General;  Laterality: N/A;  Laproscopic insertion   LAPAROSCOPIC ROUX-EN-Y GASTRIC BYPASS WITH HIATAL HERNIA REPAIR N/A 01/02/2024   Procedure: LAPAROSCOPIC CREATION GASTRJEJONOSTOMY;  Surgeon: Lyndel Deward PARAS, MD;  Location: [facility];  Service: General;  Laterality: N/A;  GASTROJEJUNOSTOMY   PORT-A-CATH REMOVAL Left 07/09/2021   Procedure: REMOVAL PORT-A-CATH;  Surgeon: [name], MD;  Location: [facility];  Service: General;  Laterality: Left;   PORTACATH PLACEMENT Left 01/26/2021   Procedure: INSERTION  PORT-A-CATH;  Surgeon: [name], MD;  Location: [facility];  Service: General;  Laterality: Left;   RADIOACTIVE SEED GUIDED AXILLARY SENTINEL LYMPH NODE Right 07/09/2021   Procedure: RADIOACTIVE SEED GUIDED RIGHT AXILLARY SENTINEL LYMPH NODE DISSECTION;  Surgeon: [name], MD;  Location: [facility];  Service: General;  Laterality: Right;   TUBAL LIGATION      Prior to Admission medications   Medication Sig Start Date End Date Taking? Authorizing Provider  acetaminophen  (TYLENOL ) 325 MG tablet Take 2 tablets (650 mg total) by mouth every 6 (six) hours as needed for mild pain (pain score 1-3) or fever (or Fever >/= 101). 03/09/24  Yes Rosario Leatrice FERNS, MD  calcitRIOL  (ROCALTROL ) 0.25 MCG capsule Take 1 capsule (0.25 mcg total) by mouth 2 (two) times daily. 03/30/24  Yes Causey, Morna Pickle, NP  Calcium  Carb-Cholecalciferol  (CALCIUM  600+D) 600-10 MG-MCG TABS Take 1 tablet by mouth 3 (three) times daily with meals. 03/30/24  Yes Causey, Morna Pickle, NP  fentaNYL  (DURAGESIC ) 25 MCG/HR Place 1 patch onto the skin every 3 (three) days. 03/11/24  Yes Rosario Leatrice FERNS, MD  lidocaine -prilocaine  (EMLA ) cream Apply to affected area once Patient taking differently: Apply 1 Application topically daily as needed (for port). 02/08/24  Yes [name], [name], MD  magnesium  oxide (MAG-OX) 400 (240 Mg) MG tablet Take 1 tablet (400 mg total) by mouth 2 (two) times daily. 03/28/24 06/26/24 Yes [place], Eric J, DO  ondansetron  (ZOFRAN ) 8 MG tablet Take 1 tablet (8 mg total) by mouth every 8 (eight) hours as needed for nausea or vomiting. Start on the third day after chemotherapy. 02/08/24  Yes [name], [name], MD  oxyCODONE  (ROXICODONE ) 15 MG immediate release tablet Take 1 tablet (15 mg total) by mouth every 4 (four) hours as needed for pain. 03/29/24  Yes [name], Athena N, NP  prochlorperazine  (COMPAZINE ) 10 MG tablet Take 1 tablet (10 mg total) by mouth every 6 (six) hours as  needed for nausea or vomiting. 02/08/24  Yes [name], MD    Current Facility-Administered Medications  Medication Dose Route Frequency Provider Last Rate Last Admin   calcitRIOL  (ROCALTROL ) 1 MCG/ML solution 0.25 mcg  0.25 mcg Per Tube BID Ghimire, Kuber, MD   0.25 mcg at 04/04/24 0901   calcium  gluconate 3 g in sodium chloride  0.9 % 100 mL IVPB  3 g Intravenous Q6H Geralynn Charleston, MD       Chlorhexidine  Gluconate Cloth 2 % PADS 6 each  6 each Topical Daily Alfornia Madison, MD   6 each at 04/04/24 0926   feeding supplement (BOOST / RESOURCE BREEZE) liquid 1 Container  1 Container Oral TID WC Patel, Pranav M, MD   1 Container at 04/04/24 1234   feeding supplement (KATE FARMS STANDARD 1.4) liquid 325 mL  325 mL Oral BID BM Patel, Pranav M, MD       fentaNYL  (DURAGESIC ) 25 MCG/HR 1 patch  1 patch Transdermal Q72H Rathore,  Editha, MD   1 patch at 04/02/24 2218   HYDROmorphone  (DILAUDID ) injection 1 mg  1 mg Intravenous Q2H PRN Ghimire, Kuber, MD   1 mg at 04/04/24 1238   magnesium  sulfate IVPB 2 g 50 mL  2 g Intravenous Q8H Geralynn Charleston, MD 50 mL/hr at 04/04/24 1058 2 g at 04/04/24 1058   multivitamin with minerals tablet 1 tablet  1 tablet Per Tube Daily Raenelle Coria, MD   1 tablet at 04/04/24 0901   naloxone  (NARCAN ) injection 0.4 mg  0.4 mg Intravenous PRN Alfornia Editha, MD       oxyCODONE  (ROXICODONE ) 5 MG/5ML solution 10 mg  10 mg Oral Q4H PRN Raenelle Coria, MD       pantoprazole  (PROTONIX ) injection 40 mg  40 mg Intravenous Q12H Patel, Pranav M, MD       prochlorperazine  (COMPAZINE ) injection 10 mg  10 mg Intravenous Q6H Patel, Pranav M, MD       sodium bicarbonate 150 mEq in sterile water  1,150 mL infusion   Intravenous Continuous Geralynn Charleston, MD 75 mL/hr at 04/04/24 1147 New Bag at 04/04/24 1147   sodium chloride  flush (NS) 0.9 % injection 10-40 mL  10-40 mL Intracatheter Q12H Alfornia Editha, MD   10 mL at 04/04/24 0901   sodium chloride  flush (NS) 0.9 %  injection 10-40 mL  10-40 mL Intracatheter PRN Alfornia Editha, MD       sodium phosphate  30 mmol in sodium chloride  0.9 % 250 mL infusion  30 mmol Intravenous Once Tobie Yetta HERO, MD 43 mL/hr at 04/04/24 0857 30 mmol at 04/04/24 0857   thiamine  (VITAMIN B1) tablet 100 mg  100 mg Per Tube Daily Raenelle Coria, MD   100 mg at 04/04/24 0901   trimethobenzamide  (TIGAN ) injection 200 mg  200 mg Intramuscular Q8H PRN Alfornia Editha, MD   200 mg at 04/04/24 1234    Allergies as of 04/01/2024 - Review Complete 04/01/2024  Allergen Reaction Noted   Enhertu  [fam-trastuzumab  deruxtec-nxki] Other (See Comments) 02/16/2024    Family History  Problem Relation Age of Onset   Breast cancer Mother        61s   Hypertension Father    Uterine cancer Maternal Aunt    Ovarian cancer Maternal Aunt    Breast cancer Maternal Aunt        58s   Breast cancer Cousin 56   Uterine cancer Cousin 50   Colon polyps Neg Hx    Colon cancer Neg Hx    Esophageal cancer Neg Hx    Stomach cancer Neg Hx     Social History   Socioeconomic History   Marital status: Single    Spouse name: Not on file   Number of children: Not on file   Years of education: Not on file   Highest education level: Not on file  Occupational History   Not on file  Tobacco Use   Smoking status: Never   Smokeless tobacco: Never  Vaping Use   Vaping status: Never Used  Substance and Sexual Activity   Alcohol use: Not Currently    Alcohol/week: 7.0 standard drinks of alcohol    Types: 7 Standard drinks or equivalent per week    Comment: occassional   Drug use: No   Sexual activity: Not Currently  Other Topics Concern   Not on file  Social History Narrative   Not on file   Social Drivers of Health   Financial Resource Strain: High Risk (02/13/2024)  Overall Financial Resource Strain (CARDIA)    Difficulty of Paying Living Expenses: Hard  Food Insecurity: No Food Insecurity (04/01/2024)   Hunger Vital Sign     Worried About Running Out of Food in the Last Year: Never true    Ran Out of Food in the Last Year: Never true  Recent Concern: Food Insecurity - Food Insecurity Present (02/16/2024)   Hunger Vital Sign    Worried About Running Out of Food in the Last Year: Sometimes true    Ran Out of Food in the Last Year: Sometimes true  Transportation Needs: No Transportation Needs (04/01/2024)   PRAPARE - Administrator, Civil Service (Medical): No    Lack of Transportation (Non-Medical): No  Physical Activity: Inactive (02/13/2024)   Exercise Vital Sign    Days of Exercise per Week: 0 days    Minutes of Exercise per Session: 0 min  Stress: No Stress Concern Present (02/13/2024)   [employer] of Occupational Health - Occupational Stress Questionnaire    Feeling of Stress: Not at all  Social Connections: Moderately Isolated (04/01/2024)   Social Connection and Isolation Panel    Frequency of Communication with Friends and Family: More than three times a week    Frequency of Social Gatherings with Friends and Family: Once a week    Attends Religious Services: 1 to 4 times per year    Active Member of [employer] or Organizations: No    Attends Banker Meetings: Never    Marital Status: Never married  Intimate Partner Violence: Not At Risk (04/01/2024)   Humiliation, Afraid, Rape, and Kick questionnaire    Fear of Current or Ex-Partner: No    Emotionally Abused: No    Physically Abused: No    Sexually Abused: No    Review of Systems: Gen: Denies fever, sweats or chills. No weight loss.  CV: Denies chest pain, palpitations or edema. Resp: Denies cough, shortness of breath of hemoptysis.  GI: See HPI.  GU : Denies urinary burning, blood in urine, increased urinary frequency or incontinence. MS: Denies joint pain, muscles aches or weakness. Derm: Denies rash, itchiness, skin lesions or unhealing ulcers. Psych: Denies depression, anxiety, memory loss or confusion. Heme:  Denies easy bruising, bleeding. Neuro:  Denies headaches, dizziness or paresthesias. Endo:  Denies any problems with DM, thyroid  or adrenal function.  Physical Exam: Vital signs in last 24 hours: Temp:  [98 F (36.7 C)-98.5 F (36.9 C)] 98 F (36.7 C) (10/15 1216) Pulse Rate:  [74-77] 74 (10/15 1216) Resp:  [18-20] 20 (10/15 1216) BP: (96-112)/(70-77) 96/70 (10/15 1216) SpO2:  [96 %-100 %] 100 % (10/15 1216) Weight:  [75.2 kg] 75.2 kg (10/15 0617) Last BM Date : 04/03/24 General: Alert chronically ill appearing 53 year old female in no acute distress. Head:  Normocephalic and atraumatic. Eyes:  No scleral icterus. Conjunctiva pink. Ears:  Normal auditory acuity. Nose:  No deformity, discharge or lesions. Mouth:  Dentition intact. No ulcers or lesions.  Brownish coating on tongue. Neck:  Supple. No lymphadenopathy or thyromegaly.  Lungs: Breath sounds clear throughout. No wheezes, rhonchi or crackles.  Heart: Regular rate and rhythm, no murmurs Abdomen: Soft, mild distention.  Moderate tenderness to the LUQ to the surrounding G-tube without rebound or guarding.  Mild tenderness throughout the lower abdomen without rebound or guarding.  Prior J site open with ostomy bag without drainage.  Rectal: Deferred. Musculoskeletal:  Symmetrical without gross deformities.  Pulses:  Normal pulses noted. Extremities:  Without  clubbing or edema. Neurologic:  Alert and  oriented x 4. No focal deficits.  Skin:  Intact without significant lesions or rashes. Psych:  Alert and cooperative. Normal mood and affect.  Intake/Output from previous day: 10/14 0701 - 10/15 0700 In: 2058.3 [P.O.:480; I.V.:568.5; IV Piggyback:1009.8] Out: 400 [Emesis/NG output:400] Intake/Output this shift: Total I/O In: 260 [P.O.:260] Out: -   Lab Results: Recent Labs    04/01/24 1825 04/03/24 0540  WBC 3.4* 3.2*  HGB 9.3* 8.7*  HCT 28.9* 27.1*  PLT 159 152   BMET Recent Labs    04/02/24 0228  04/03/24 0540 04/04/24 0255  NA 137 136 137  K 3.2* 4.0 4.1  CL 108 107 106  CO2 21* 19* 22  GLUCOSE 83 87 78  BUN <5* <5* <5*  CREATININE <0.30* <0.30* <0.30*  CALCIUM  5.3* 4.9* 5.3*   LFT Recent Labs    04/01/24 1825 04/04/24 0255  PROT 5.7*  --   ALBUMIN  3.3* 2.9*  AST 22  --   ALT 23  --   ALKPHOS 809*  --   BILITOT 0.4  --    PT/INR No results for input(s): LABPROT, INR in the last 72 hours. Hepatitis Panel No results for input(s): HEPBSAG, HCVAB, HEPAIGM, HEPBIGM in the last 72 hours.   Studies/Results: No results found.  IMPRESSION/PLAN:  53 year old female with a history of a duodenal obstruction s/p gastrojejunostomy bypass surgery 12/2023 readmitted with intractable nausea/vomiting. 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 CTAP without acute intra-abdominal/pelvic pathology to explain her symptoms, no evidence of gastric outlet obstruction/small bowel obstruction and abdominal fluid collections continue to decrease. - Two-view abdominal x-ray to rule out gastric distension  - Defer endoscopic recommendations to Dr. Avram - After further review, avoid Compazine  around the clock as previously considered, I am switching back to Q 6 hrs PRN. Patient seen by palliative care, see their management plan for nausea. Appreciate palliative care input.  - Pain management per the hospitalist - Hypocalcemia likely contributing to persistent vomiting - Diet as tolerated, boost breeze one container tid  - Await further recommendations per Dr. Avram  Electrolyte derangement with hypokalemia, hypocalcemia, hypophosphatemia and hypomagnesia.  Parathyroid  hormone and calcitriol  levels pending.  On Calcium  Gluconate 3 g IV every 6 hours and Calcitrol twice daily.  Received IV electrolyte replacement IV. 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  C. difficile and GI pathogen panel negative. - Palliative care considering Octreotide  Anemia of chronic disease - Transfuse for hemoglobin level less than 7 or as needed if symptomatic  History of esophagitis/gastritis - Continue Pantoprazole  bid  Metastatic breast cancer, peritoneal carcinomatosis with possible pancreatic metastasis - Continue follow-up with oncology   Chelsea Hansen  04/04/2024, 4:33 PM     [facility] Attending   I have taken an interval history, reviewed the chart and examined the patient.  I have personally reviewed recent CT imaging.  I have performed the majority of the medical decision making which is of high complexity.  I agree with the Advanced Practitioner's note, impression and recommendations with the following additions:  Very complicated and unfortunate patient with widely metastatic breast cancer and chronic recurrent nausea and vomiting.  She is also having diarrhea problems.  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  Chronic narcotic use could  be playing some role also.  I do not think there is much we can offer here.  Will check abdominal films.  I do not think there is a role for endoscopic evaluation at this point.  I think palliative care involvement is a great idea and will defer to them regarding management of nausea and vomiting at this time.  We will follow along and try to understand this better and help as possible.  Lupita CHARLENA Commander, MD, [facility] See TRACEY on call - gastroenterology for best contact person 04/04/2024 4:41 PM

## 2024-04-04 NOTE — Progress Notes (Addendum)
 [facility] Progress Note  Subjective:    Vitals:   04/04/24 0458 04/04/24 0500 04/04/24 0617 04/04/24 1216  BP: 105/71   96/70  Pulse: 77   74  Resp: 18   20  Temp: 98.5 F (36.9 C)   98 F (36.7 C)  TempSrc: Oral     SpO2: 96%   100%  Weight:  75.2 kg 75.2 kg   Height:        Exam: Gen alert, no distress Sclera anicteric, throat clear  No jvd or bruits Chest clear bilat to bases RRR no MRG Abd soft ntnd no mass or ascites +bs Ext no LE or UE edema, no other edema Neuro is alert, Ox 3 , nf     Vit D, 25 hydroxy = 7.9 (30-100) Vit 1, 25 dihydroxy = pend PTH =  Today's lab --> Mg 2.0 (up from 1.6 yest)    Ca 5.3 / CCa 6.1, alb 2.9    CO2 = 22, stable       Assessment/ Plan: 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 Has been getting denosumab  every 3 mos since dec 2023, 8 doses in total, the last dose was 03/14/24. Her vit D3 is very low, just came back. 1,25 vit D and PTH are pending. The denosumab  therapy, ongoing for last 22 months, is the likely cause of severe hypocalcemia, in conjunction w/ vit D deficiency (vit D = 7.9). Renal function is normal. PTH pending. Denosumab  inhibits osteoclast function, so the process of using bone breakdown to keep blood Ca++ levels up is blocked. Bone building is still happening and the 2 are out of balance. When the drug eventually is fully metabolized (could be months), presumably her hypocalcemia issues should be fixed. Until then will need aggressive IV (now) and po Ca and Mg (when better) replenishing to keep Ca in the 8-10 range.  - will ^IV Cagluc to 3 gm q 6 hrs, cont IV until Ca > 7 - cont activated vit D (1.25 hydroxy) 0.25 mcg bid - transition to po CaCO3 when GI issues resolved and Ca out of danger zone - when taking po will need high dose 25 hydroxy vit D too  Hypomagnesemia: Mg was low at 1.6, started IV Mg 2 gm every 8 hrs, will continue another 24 hrs,  goal is > 2.0- 2.4.  Hypophosphatemia: appreciate pharm/ pmd assist w/ replenishing Hypokalemia: appreciate pharm/ pmd assist w/ replenishing Volume: looks euvolemic, could be a bit dry.  Cont IVF (bicarb) at 75 cc/hr.  Metabolic acidosis: not sure cause, will use bicarb drip at 75 cc/hr.  Breast cancer: w/ peritoneal, bony mets       Myer Fret MD  [facility] 04/04/2024, 12:28 PM  Recent Labs  Lab 04/01/24 1825 04/02/24 0228 04/03/24 0540 04/04/24 0255  HGB 9.3*  --  8.7*  --   ALBUMIN  3.3*  --   --  2.9*  CALCIUM  5.4*   < > 4.9* 5.3*  PHOS  --    < > 1.8* 2.2*  CREATININE 0.31*   < > <0.30* <0.30*  K 3.2*   < > 4.0 4.1   < > = values in this interval not displayed.   No results for input(s): IRON, TIBC, FERRITIN in the last 168 hours. Inpatient medications:  calcitRIOL   0.25 mcg Per Tube BID   Chlorhexidine  Gluconate Cloth  6 each Topical Daily   feeding supplement  1 Container Oral TID  WC   feeding supplement (KATE FARMS STANDARD 1.4)  325 mL Oral BID BM   fentaNYL   1 patch Transdermal Q72H   multivitamin with minerals  1 tablet Per Tube Daily   sodium chloride  flush  10-40 mL Intracatheter Q12H   thiamine   100 mg Per Tube Daily    calcium  gluconate 2,000 mg (04/04/24 1227)   magnesium  sulfate bolus IVPB 2 g (04/04/24 1058)   sodium bicarbonate 150 mEq in sterile water  1,150 mL infusion 75 mL/hr at 04/04/24 1147   sodium PHOSPHATE  IVPB (in mmol) 30 mmol (04/04/24 0857)   HYDROmorphone  (DILAUDID ) injection, naLOXone  (NARCAN )  injection, oxyCODONE , prochlorperazine , sodium chloride  flush, trimethobenzamide 

## 2024-04-04 NOTE — Progress Notes (Signed)
 Calorie Count Note  48 hour calorie count ordered.  Diet: Regular Supplements:  -Magic cup BID with meals, each supplement provides 290 kcal and 9 grams of protein   10/14: Breakfast: 65 kcals, 1g protein Lunch: 0% -didn't order Dinner: Ate 1/2 spaghetti, 100% salad, broccoli, Magic cup - negligible d/t vomited everything back up Supplements: none  Total intake: 65 kcal (2% of minimum estimated needs)  1g protein (1% of minimum estimated needs)  Today 10/15: B: 445 kcals, 14g protein  Nutrition Dx: Moderate Malnutrition related to chronic illness, cancer and cancer related treatments as evidenced by severe muscle depletion, moderate fat depletion.    Goal: Pt to meet >/= 90% of their estimated nutrition needs   Intervention:  -[place] 1.4 PO BID, each provides 455 kcals and 20g protein -Boost Breeze po TID -Magic cup TID with meals -Continue vitamins as tolerated via tube -Continue Calorie count as pt is eating better so far today -If continues to have N/V, consider TPN if within GOC   Morna Lee, MS, RD, LDN Inpatient Clinical Dietitian Contact via Secure chat

## 2024-04-05 ENCOUNTER — Encounter: Payer: Self-pay | Admitting: *Deleted

## 2024-04-05 ENCOUNTER — Telehealth: Payer: MEDICAID | Admitting: *Deleted

## 2024-04-05 DIAGNOSIS — E878 Other disorders of electrolyte and fluid balance, not elsewhere classified: Secondary | ICD-10-CM | POA: Diagnosis not present

## 2024-04-05 DIAGNOSIS — R112 Nausea with vomiting, unspecified: Secondary | ICD-10-CM | POA: Diagnosis not present

## 2024-04-05 LAB — RENAL FUNCTION PANEL
Albumin: 3.3 g/dL — ABNORMAL LOW (ref 3.5–5.0)
Anion gap: 9 (ref 5–15)
BUN: <5 mg/dL — ABNORMAL LOW (ref 6–20)
CO2: 23 mmol/L (ref 22–32)
Calcium: 6.5 mg/dL — ABNORMAL LOW (ref 8.9–10.3)
Chloride: 103 mmol/L (ref 98–111)
Creatinine, Ser: <0.3 mg/dL — ABNORMAL LOW (ref 0.44–1.00)
Glucose, Bld: 160 mg/dL — ABNORMAL HIGH (ref 70–99)
Phosphorus: 1.9 mg/dL — ABNORMAL LOW (ref 2.5–4.6)
Potassium: 4.7 mmol/L (ref 3.5–5.1)
Sodium: 134 mmol/L — ABNORMAL LOW (ref 135–145)

## 2024-04-05 LAB — CBC
HCT: 29.6 % — ABNORMAL LOW (ref 36.0–46.0)
Hemoglobin: 9.5 g/dL — ABNORMAL LOW (ref 12.0–15.0)
MCH: 26.4 pg (ref 26.0–34.0)
MCHC: 32.1 g/dL (ref 30.0–36.0)
MCV: 82.2 fL (ref 80.0–100.0)
Platelets: 180 K/uL (ref 150–400)
RBC: 3.6 MIL/uL — ABNORMAL LOW (ref 3.87–5.11)
RDW: 16.8 % — ABNORMAL HIGH (ref 11.5–15.5)
WBC: 3.9 K/uL — ABNORMAL LOW (ref 4.0–10.5)
nRBC: 0 % (ref 0.0–0.2)

## 2024-04-05 LAB — BASIC METABOLIC PANEL WITH GFR
Anion gap: 10 (ref 5–15)
BUN: <5 mg/dL — ABNORMAL LOW (ref 6–20)
CO2: 23 mmol/L (ref 22–32)
Calcium: 6.7 mg/dL — ABNORMAL LOW (ref 8.9–10.3)
Chloride: 102 mmol/L (ref 98–111)
Creatinine, Ser: 0.34 mg/dL — ABNORMAL LOW (ref 0.44–1.00)
GFR, Estimated: >60 mL/min (ref >60)
Glucose, Bld: 130 mg/dL — ABNORMAL HIGH (ref 70–99)
Potassium: 4.7 mmol/L (ref 3.5–5.1)
Sodium: 135 mmol/L (ref 135–145)

## 2024-04-05 LAB — MAGNESIUM: Magnesium: 2.5 mg/dL — ABNORMAL HIGH (ref 1.7–2.4)

## 2024-04-05 LAB — CALCITRIOL (1,25 DI-OH VIT D): Vit D, 1,25-Dihydroxy: 94.6 pg/mL — ABNORMAL HIGH (ref 24.8–81.5)

## 2024-04-05 MED ORDER — PANTOPRAZOLE SODIUM 40 MG PO TBEC: 40.0000 mg | DELAYED_RELEASE_TABLET | Freq: Every day | ORAL | Status: DC

## 2024-04-05 MED ORDER — MAGNESIUM OXIDE -MG SUPPLEMENT 400 (240 MG) MG PO TABS
400.0000 mg | ORAL_TABLET | Freq: Two times a day (BID) | ORAL | Status: DC
  Administered 2024-04-05 – 2024-04-09 (×9): 400 mg via ORAL
  Filled 2024-04-05 (×10): qty 1

## 2024-04-05 MED ORDER — ALTEPLASE 2 MG IJ SOLR
2.0000 mg | Freq: Once | INTRAMUSCULAR | Status: AC
Start: 2024-04-05 — End: ?
  Filled 2024-04-05: qty 2

## 2024-04-05 MED ORDER — ENOXAPARIN SODIUM 40 MG/0.4ML IJ SOSY
40.0000 mg | PREFILLED_SYRINGE | INTRAMUSCULAR | Status: DC
  Administered 2024-04-05 – 2024-04-06 (×2): 40 mg via SUBCUTANEOUS
  Filled 2024-04-05 (×2): qty 0.4

## 2024-04-05 MED ORDER — ERGOCALCIFEROL 200 MCG/ML PO SOLN
8000.0000 [IU] | Freq: Every day | ORAL | Status: DC
  Administered 2024-04-05 – 2024-04-11 (×7): 8000 [IU]
  Filled 2024-04-05 (×8): qty 1

## 2024-04-05 MED ORDER — CALCIUM GLUCONATE-NACL 2-0.675 GM/100ML-% IV SOLN
2.0000 g | Freq: Three times a day (TID) | INTRAVENOUS | Status: AC
Start: 2024-04-05 — End: 2024-04-06
  Administered 2024-04-05 – 2024-04-06 (×3): 2000 mg via INTRAVENOUS
  Filled 2024-04-05 (×4): qty 100

## 2024-04-05 MED ORDER — CALCIUM CARBONATE ANTACID 1250 MG/5ML PO SUSP
1000.0000 mg | Freq: Three times a day (TID) | ORAL | Status: DC
  Administered 2024-04-05 (×2): 1000 mg
  Filled 2024-04-05 (×4): qty 10

## 2024-04-05 MED ORDER — SODIUM PHOSPHATES 45 MMOLE/15ML IV SOLN
30.0000 mmol | Freq: Once | INTRAVENOUS | Status: AC
  Administered 2024-04-05: 30 mmol via INTRAVENOUS
  Filled 2024-04-05: qty 10

## 2024-04-05 NOTE — Plan of Care (Signed)
  Problem: Education: Goal: Knowledge of General Education information will improve Description: Including pain rating scale, medication(s)/side effects and non-pharmacologic comfort measures Outcome: Progressing   Problem: Health Behavior/Discharge Planning: Goal: Ability to manage health-related needs will improve Outcome: Progressing   Problem: Clinical Measurements: Goal: Ability to maintain clinical measurements within normal limits will improve Outcome: Progressing Goal: Will remain free from infection Outcome: Progressing Goal: Diagnostic test results will improve Outcome: Progressing Goal: Respiratory complications will improve Outcome: Progressing Goal: Cardiovascular complication will be avoided Outcome: Progressing   Problem: Activity: Goal: Risk for activity intolerance will decrease Outcome: Progressing   Problem: Nutrition: Goal: Adequate nutrition will be maintained Outcome: Progressing   Problem: Coping: Goal: Level of anxiety will decrease Outcome: Progressing   Problem: Elimination: Goal: Will not experience complications related to bowel motility Outcome: Progressing Goal: Will not experience complications related to urinary retention Outcome: Progressing   Problem: Pain Managment: Goal: General experience of comfort will improve and/or be controlled Outcome: Progressing   Problem: Safety: Goal: Ability to remain free from injury will improve Outcome: Progressing   Problem: Skin Integrity: Goal: Risk for impaired skin integrity will decrease Outcome: Progressing   Problem: Nutrition Goal: Nutritional status is improving Description: Monitor and assess patient for malnutrition (ex- brittle hair, bruises, dry skin, pale skin and conjunctiva, muscle wasting, smooth red tongue, and disorientation). Collaborate with interdisciplinary team and initiate plan and interventions as ordered.  Monitor patient's weight and dietary intake as ordered or per  policy. Utilize nutrition screening tool and intervene per policy. Determine patient's food preferences and provide high-protein, high-caloric foods as appropriate.  Outcome: Progressing

## 2024-04-05 NOTE — Progress Notes (Signed)
 [facility] Progress Note  Subjective:  Seen in room Feeling good overall Requiring serial anti- nausea meds to keep her nausea at bay, but I think this is long-term issue  Vitals:   04/04/24 0500 04/04/24 0617 04/04/24 1216 04/04/24 2026  BP:   96/70 116/80  Pulse:   74 69  Resp:   20 12  Temp:   98 F (36.7 C) 97.8 F (36.6 C)  TempSrc:    Oral  SpO2:   100% 100%  Weight: 75.2 kg 75.2 kg    Height:        Exam: Gen alert, no distress Sclera anicteric, throat clear  No jvd or bruits Chest clear bilat to bases RRR no MRG Abd soft ntnd no mass or ascites +bs Ext no LE or UE edema, no other edema Neuro is alert, Ox 3 , nf     Vit D, 25 hydroxy = 7.9 (30-100) Vit 1, 25 dihydroxy = 94 (24- 81) PTH = 207 (15- 65) Today's lab --> Mg ^ 2.5    Ca ^6.5 (CCa = 7.0)       Assessment/ Plan: 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 Has been getting denosumab  every 3 mos since dec 2023, 8 doses in total, the last dose was 03/14/24. Her vit D3 is very low, just came back. 1,25 vit D is high and PTH is slightly high at 207. Denosumab  therapy, ongoing for last 22 months, is the likely cause of her hypocalcemia. Low vit D 25 is contributing. Renal function is normal. Denosumab  inhibits osteoclast function, so the process of using bone breakdown to keep blood Ca++ levels up is blocked. Bone building is still happening, so the 2 are out of balance - more Ca++ going into bones than coming out. When the drug eventually is fully metabolized (about 1-3 months), presumably her hypocalcemia issues should be fixed. Until then needs aggressive IV then po Ca replacement. Also need to maintain Mg levels.  - cont IV Cagluc, lower to 2 gm q 8 hrs x 3 - cont activated vit D (1.25 hydroxy) 0.25 mcg bid - will start po liquid CaCO3 via tube 1gm elemental tid - will need high dose 25 hydroxy vit D, defer to pmd  Hypomagnesemia: Mg was  low at 1.6, now is up to 2.5 (1.7- 2.4) today. IV Mg dc'd, starting back on Mg oxide 400mg  bid per tube.  Hypophosphatemia: appreciate pharm/ pmd assist w/ replenishing Hypokalemia: appreciate pharm/ pmd assist w/ replenishing Volume: looks euvolemic to a bit dry, cont IVF (bicarb) at 75 cc/hr.  Metabolic acidosis: will use bicarb drip at 75 cc/hr.  Breast cancer: w/ peritoneal and bony mets       Myer Fret MD  [facility] 04/05/2024, 1:39 PM  Recent Labs  Lab 04/03/24 0540 04/04/24 0255 04/04/24 1808 04/05/24 0014  HGB 8.7*  --   --  9.5*  ALBUMIN   --  2.9*  --  3.3*  CALCIUM  4.9* 5.3* 6.0* 6.5*  PHOS 1.8* 2.2*  --  1.9*  CREATININE <0.30* <0.30* <0.30* <0.30*  K 4.0 4.1 4.0 4.7   No results for input(s): IRON, TIBC, FERRITIN in the last 168 hours. Inpatient medications:  calcitRIOL   0.25 mcg Per Tube BID   Chlorhexidine  Gluconate Cloth  6 each Topical Daily   dexamethasone  (DECADRON ) injection  4 mg Intravenous Q12H   enoxaparin  (LOVENOX ) injection  40 mg Subcutaneous Q24H   feeding supplement  1 Container  Oral TID WC   feeding supplement (KATE FARMS STANDARD 1.4)  325 mL Oral BID BM   fentaNYL   1 patch Transdermal Q72H   fentaNYL   1 patch Transdermal Q72H   multivitamin with minerals  1 tablet Per Tube Daily   OLANZapine  zydis  5 mg Oral QHS   pantoprazole  (PROTONIX ) IV  40 mg Intravenous Q12H   sodium chloride  flush  10-40 mL Intracatheter Q12H   thiamine   100 mg Per Tube Daily    sodium bicarbonate 150 mEq in sterile water  1,150 mL infusion 75 mL/hr at 04/05/24 0826   sodium PHOSPHATE  IVPB (in mmol) 30 mmol (04/05/24 0837)   HYDROmorphone  (DILAUDID ) injection, naLOXone  (NARCAN )  injection, oxyCODONE , prochlorperazine , sodium chloride  flush

## 2024-04-05 NOTE — Progress Notes (Signed)
 Calorie Count Note   48 hour calorie count ordered.   Diet: Regular Supplements:  [place] 1.4 PO BID, each provides 455 kcals and 20g protein -Boost Breeze po TID, each supplement provides 250 kcal and 9 grams of protein  -Magic cup BID with meals, each supplement provides 290 kcal and 9 grams of protein    10/14: Breakfast: 65 kcals, 1g protein Lunch: 0% -didn't order Dinner: Ate 1/2 spaghetti, 100% salad, broccoli, Magic cup - negligible d/t vomited everything back up Supplements: none   Total intake: 65 kcal (2% of minimum estimated needs)  1g protein (1% of minimum estimated needs)   10/15: B: 445 kcals, 14g protein -negligible d/t vomiting back up L: 755 kcals, 28g protein -RN reports tolerated D: 180 kcals, 10g protein Supplements: none -refusing  Total intake: 935 kcal (42% of minimum estimated needs)  38g protein (34% of minimum estimated needs)   Nutrition Dx: Moderate Malnutrition related to chronic illness, cancer and cancer related treatments as evidenced by severe muscle depletion, moderate fat depletion.     Goal: Pt to meet >/= 90% of their estimated nutrition needs    Intervention:  -D/c Boost Breeze and [place]  -Continue Magic cup TID with meals -Continue vitamins as tolerated via tube -If continues to have N/V, consider TPN if within GOC  Chelsea Lee, MS, RD, LDN Inpatient Clinical Dietitian Contact via Secure chat

## 2024-04-05 NOTE — Progress Notes (Signed)
 Daily Progress Note   Patient Name: Chelsea Hansen       Date: 04/05/2024 DOB: 17-Feb-1971  Age: 53 y.o. MRN#: [id number] Attending Physician: Tobie Yetta CHRISTELLA, MD Primary Care Physician: [name], NP Admit Date: 04/01/2024  Reason for Consultation/Follow-up: Establishing goals of care, Non pain symptom management, and Pain control  Subjective:  Awake alert, resting in bed, slept well, no further vomiting since overnight, pain reasonably well controlled, denies nausea at present, looking forward to her food.   Length of Stay: 3  Current Medications: Scheduled Meds:   calcitRIOL   0.25 mcg Per Tube BID   Chlorhexidine  Gluconate Cloth  6 each Topical Daily   dexamethasone  (DECADRON ) injection  4 mg Intravenous Q12H   enoxaparin  (LOVENOX ) injection  40 mg Subcutaneous Q24H   feeding supplement  1 Container Oral TID WC   feeding supplement (KATE FARMS STANDARD 1.4)  325 mL Oral BID BM   fentaNYL   1 patch Transdermal Q72H   fentaNYL   1 patch Transdermal Q72H   multivitamin with minerals  1 tablet Per Tube Daily   OLANZapine  zydis  5 mg Oral QHS   pantoprazole  (PROTONIX ) IV  40 mg Intravenous Q12H   sodium chloride  flush  10-40 mL Intracatheter Q12H   thiamine   100 mg Per Tube Daily    Continuous Infusions:  sodium bicarbonate 150 mEq in sterile water  1,150 mL infusion 75 mL/hr at 04/05/24 0826   sodium PHOSPHATE  IVPB (in mmol) 30 mmol (04/05/24 0837)    PRN Meds: HYDROmorphone  (DILAUDID ) injection, naLOXone  (NARCAN )  injection, oxyCODONE , prochlorperazine , sodium chloride  flush  Physical Exam         Awake alert No distress Regular S1 S 2 No edema Abdomen not distended Has PEG  Vital Signs: BP 116/80 (BP Location: Right Arm)   Pulse 69   Temp 97.8 F (36.6 C)  (Oral)   Resp 12   Ht 5' 11 (1.803 m)   Wt 75.2 kg   SpO2 100%   BMI 23.12 kg/m  SpO2: SpO2: 100 % O2 Device: O2 Device: Room Air O2 Flow Rate:    Intake/output summary:  Intake/Output Summary (Last 24 hours) at 04/05/2024 1249 Last data filed at 04/05/2024 0000 Gross per 24 hour  Intake 2052.35 ml  Output --  Net 2052.35 ml   LBM: Last  BM Date : 04/04/24 Baseline Weight: Weight: 75.3 kg Most recent weight: Weight: 75.2 kg       Palliative Assessment/Data:      Patient Active Problem List   Diagnosis Date Noted   Malnutrition of moderate degree 04/04/2024   Fall 04/01/2024   Neonatal cows' milk hypocalcemia 03/20/2024   Vomiting and diarrhea 03/19/2024   H/O bypass gastrojejunostomy 03/08/2024   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   Common bile duct dilation 03/07/2024   Biliary obstruction (HCC) 03/07/2024   Abnormal LFTs 03/07/2024   Chronic anemia 03/07/2024   Gastrostomy tube present (HCC) 02/10/2024   Irritant contact dermatitis associated with digestive stoma 02/10/2024   Protein-calorie malnutrition, severe 02/03/2024   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 02/01/2024   Generalized abdominal pain 01/17/2024   Elevated CA 19-9 level 01/16/2024   Hypophosphatemia 01/04/2024   Hypomagnesemia 01/04/2024   Pancreatic lesion 12/29/2023   Unintentional weight loss 12/29/2023   Nausea and vomiting 12/29/2023   Duodenal obstruction 12/29/2023   Abnormal MRI of abdomen 12/26/2023   Abnormal CT of the abdomen 12/23/2023   Elevated liver enzymes 12/23/2023   Acute kidney injury 12/23/2023   AKI (acute kidney injury) 12/22/2023   Elevated LFTs 12/22/2023   Pancreatic mass 12/22/2023   Right sided weakness 12/22/2023   Hypotension 12/22/2023   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   Abnormal CT scan, colon 12/14/2023   Acute esophagitis 12/14/2023   Candida esophagitis  (HCC) 12/14/2023   Therapeutic opioid-induced constipation (OIC) 12/11/2023   Hypocalcemia 12/10/2023   Fever of unknown origin 12/10/2023   Sinus tachycardia 12/10/2023   Acute kidney injury superimposed on chronic kidney disease 12/05/2023   Electrolyte abnormality 12/05/2023   Dehydration 12/05/2023   QT prolongation 12/05/2023   Intractable nausea and vomiting 11/24/2023   Neoplasm related pain 12/07/2022   Constipation 06/30/2022   Nausea and vomiting in adult 06/29/2022   Hematemesis 06/28/2022   Breast cancer metastasized to bone, right (HCC) 05/20/2022   Supraventricular tachycardia 07/08/2021   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   GERD (gastroesophageal reflux disease) 05/27/2021   HLD (hyperlipidemia) 05/27/2021   Essential hypertension 05/27/2021   Pain management contract signed 05/04/2021   Chronic pain syndrome (breast cancer) 05/04/2021   Palpitations 03/25/2021   Sinus bradycardia 03/25/2021   Daytime somnolence 03/25/2021   Snoring 03/25/2021   Obesity (BMI 30-39.9) 03/25/2021   Port-A-Cath in place 01/27/2021   Genetic testing 12/24/2020   Family history of uterine cancer 12/17/2020   Malignant neoplasm of upper-outer quadrant of right breast in female, estrogen receptor positive (HCC) 12/12/2020   Breast cancer (HCC) 12/12/2020   Hypokalemia 01/02/2012   Chronic asthma 01/01/2012   Bradycardia 01/01/2012   Syncope 12/31/2011   Mediastinal adenopathy 12/31/2011   Pancytopenia (HCC) 12/31/2011   Chest pain 12/31/2011    Palliative Care Assessment & Plan   Patient Profile:    Assessment: 53 y.o. 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 Palliative consulted  for goals of care conversation and symptom management.    Recommendations/Plan:  Started on Decadron  4 mg IV every 12 hours and Zyprexa  5 mg nightly. To consider Octreotide if unrelenting intractable symptoms re surface.  Continue IV Dilaudid  PRN  pain Full code full scope Continue current mode of care.   Goals of Care and Additional Recommendations: Limitations on Scope of Treatment: Full Scope Treatment  Code Status:    Code Status Orders  (From admission, onward)           Start     Ordered   04/01/24 2218  Full code  Continuous       Question:  By:  Answer:  Consent: discussion documented in EHR   04/01/24 2220           Code Status History     Date Active Date Inactive Code Status Order ID Comments User Context   03/19/2024 2001 03/28/2024 2204 Full Code [id number]  Charlton Evalene RAMAN, MD Inpatient   03/05/2024 0620 03/09/2024 2305 Full Code [id number]  Franky Redia SAILOR, MD ED   02/28/2024 0046 02/28/2024 0735 Full Code [id number]  Charlton Evalene RAMAN, MD ED   02/01/2024 1707 02/06/2024 2331 Full Code [id number]  Royal Sill, MD Inpatient   12/22/2023 1914 01/17/2024 2318 Full Code [id number]  Silvester Ales, MD ED   12/10/2023 2305 12/14/2023 2036 Full Code [id number]  Lee Kingfisher, MD ED   12/05/2023 0454 12/07/2023 1846 Full Code [id number]  Keturah Carrier, MD ED   11/24/2023 1635 11/26/2023 2136 Full Code [id number]  Lou Claretta HERO, MD ED   07/06/2023 1327 07/07/2023 0503 Full Code [id number]  Hughes Simmonds, MD HOV   06/28/2022 2326 06/30/2022 1958 Full Code [id number]  Lonzell Emeline HERO, DO ED   12/31/2011 0902 01/02/2012 1255 Full Code [id number]  Tarpley, Briant Houston, RN Inpatient      Advance Directive Documentation    Flowsheet Row Most Recent Value  Type of Advance Directive Healthcare Power of Attorney  Pre-existing out of facility DNR order (yellow form or pink MOST form) --  MOST Form in Place? --    Prognosis:  Unable to determine  Discharge Planning: To  Be Determined  Care plan was discussed with  patient  IDT  Thank you for allowing the Palliative Medicine Team to assist in the care of this patient.  High MDM     Greater than 50%  of this time was spent counseling and coordinating care related to the above assessment and plan.  Lonia Serve, MD  Please contact Palliative Medicine Team phone at [phone] for questions and concerns.

## 2024-04-05 NOTE — Consult Note (Addendum)
 WOC Nurse ostomy consult note Stoma type/location: G tube site (fistula) Stomal assessment/size: 15 mm x 19 mm oval, on the skin level. Peristomal assessment: S2/T5 according to SACS; erythema, partial thickness wounds, (MASD peri G-tube). Site close to costal margin. Treatment options for stomal/peristomal skin: Powder #[id number] and barrier wipe [id number]. Ring [id number]. Output 50 ml in bag at 1300. Ostomy pouching: 1pc. #[id number] (soft convex) Education provided: Pt is well educated. Enrolled patient in [employer] DC program: No   Bed nurse instructions:  - Cleanse the skin with saline and soft wash cloth or gauze, pat dry the peri-ostomy skin. - Spread the powder, remove the exceed with a dry gauze. Tap the barrier wipes gently on top, the powder will turn transparent. Addendum: Repeat every pouch change if the pt has skin injuries, stop when the skin remains intact. - Cut the barrier as the ostomy size and shape. Addendum: the G-tube site is on the skin level, use convex barrier and the ring to provide a good seal; - Around the cut on the barrier, applied the ring, stretching and modeling. - Take the plastic protection off, apply to his skin, take the drape protection off, adapting in his the abdomen. - Close the lock in roll system. - Empty when is 1/3 full or 1/2 full. - Change the pouch 2 times per week or if is leaking.  WOC team will not plan to follow further. Please reconsult if further assistance is needed. Thank-you,  Lela Holm RN, CNS, [employer], MSN.  (Phone [phone])

## 2024-04-05 NOTE — Progress Notes (Addendum)
 Chelsea Hansen Progress Note  CC: Intractable nausea and vomiting  Subjective: She endorses feeling much better today.  She ate [place] toast, potatoes, sausage, fruit and ice cream for breakfast without vomiting.  She continues to have left-sided abdominal pain which is well-controlled at this time.  No BM yet today. No chest pain or shortness of breath.   Objective:  Vital signs in last 24 hours: Temp:  [97.8 F (36.6 C)-98 F (36.7 C)] 97.8 F (36.6 C) (10/15 2026) Pulse Rate:  [69-74] 69 (10/15 2026) Resp:  [12-20] 12 (10/15 2026) BP: (96-116)/(70-80) 116/80 (10/15 2026) SpO2:  [100 %] 100 % (10/15 2026) Last BM Date : 04/04/24 General: Alert chronically ill-appearing 53 year old female in no acute distress. Heart: Regular rate and rhythm, no murmurs. Pulm: Breath sounds clear.  On room air. Abdomen:  Soft, mild distention.  Moderate tenderness to the LUQ to the surrounding G-tube without rebound or guarding.  Mild tenderness throughout the lower abdomen without rebound or guarding.  Prior J site open with ostomy bag with a small amount of brown/greenish watery drainage. Extremities: No lower extremity edema. Neurologic:  Alert and oriented x 4.  Speech is clear.  Moves all extremities equally. Psych:  Alert and cooperative. Normal mood and affect.  Intake/Output from previous day: 10/15 0701 - 10/16 0700 In: 2312.4 [P.O.:600; I.V.:1395.5; IV Piggyback:316.9] Out: -    Lab Results: Recent Labs    04/03/24 0540 04/05/24 0014  WBC 3.2* 3.9*  HGB 8.7* 9.5*  HCT 27.1* 29.6*  PLT 152 180   BMET Recent Labs    04/04/24 0255 04/04/24 1808 04/05/24 0014  NA 137 137 134*  K 4.1 4.0 4.7  CL 106 105 103  CO2 22 22 23   GLUCOSE 78 113* 160*  BUN <5* <5* <5*  CREATININE <0.30* <0.30* <0.30*  CALCIUM  5.3* 6.0* 6.5*   LFT Recent Labs    04/05/24 0014  ALBUMIN  3.3*    DG Abd 2 Views Result Date: 04/04/2024 EXAM: 2 VIEW XRAY OF THE ABDOMEN  04/04/2024 05:02:00 PM COMPARISON: Abdominal x-ray 09/325. CT abdomen and pelvis 06/14/2024. CLINICAL HISTORY: Vomiting. Patient reports long episodes of nausea and vomiting starting today. FINDINGS: LINES, TUBES AND DEVICES: PEG tube in place. BOWEL: Nonobstructive bowel gas pattern. SOFT TISSUES: Phleboliths in the pelvis. No opaque urinary calculi. BONES: Diffuse patchy sclerotic densities are seen throughout the osseous structures compatible with metastatic disease, unchanged. IMPRESSION: 1. No acute abdominal abnormality. 2. Diffuse patchy sclerotic osseous metastases, unchanged. Electronically signed by: Chelsea Pique MD 04/04/2024 05:21 PM EDT RP Workstation: [id number]   Patient Profile: Chelsea Hansen is a 53 y.o. female with a history of metastatic breast cancer with peritoneal carcinomatosis, chronic kidney disease stage IIIa, SVT, asthma, hypertension, esophagitis, esophageal stricture, gastritis, duodenitis, duodenal stricture s/p dilatation, duodenal obstruction (previous concern for pancreatic lesion could not able to be visualized due to duodenal stenosis) leading to gastrojejunostomy bypass surgery 12/2023 with complications of abdominal fluid collections with numerous hospital admissions with intractable nausea and vomiting.  Readmitted 04/09/2024 with intractable N/V and chronic diarrhea.    Assessment / Plan:  53 year old female with a history of a duodenal obstruction s/p gastrojejunostomy bypass surgery 12/2023 readmitted with intractable nausea/vomiting. 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 CTAP without acute intra-abdominal/pelvic pathology to explain her symptoms, no evidence of gastric outlet obstruction/small bowel obstruction and abdominal fluid collections continue to decrease.  Negative 2 view abdominal x-rays 10/15.  No nausea or vomiting today, started on Decadron  and Zyprexa  per palliative care 10/15.  Hemodynamically  stable. - Nausea management per palliative care, refer to consult note 10/15. Started on Decadron  4 mg IV every 12 hours and Zyprexa  5 mg nightly. To consider Octreotide if QTc prolonged. - Compazine  10 mg IV every 6 hours as needed - Pain management per the hospitalist/palliative care - Hypocalcemia likely contributing to persistent vomiting - Diet as tolerated, boost breeze one container tid.  Appreciate RD recommendations. 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  C. difficile and GI pathogen panel negative.  No BM thus far today.   Anemia of chronic disease. Hg 8.7 -> 9.5. No overt GI bleeding.  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  I have performed the majority the medical decision making.  I agree with the Advanced Practitioner's note, impression and recommendations with the following additions:  The patient is significantly better without nausea or vomiting or diarrhea after initiation of Decadron  and Zyprexa .  I would continue this.  Transition pantoprazole  to oral (ordered).  I do not have any additional recommendations with respect to nausea and vomiting.  We will sign off but be available if needed.   Chelsea CHARLENA Avram, MD, [facility] See Chelsea Hansen on call - Hansen for best contact  person 04/05/2024 3:16 PM

## 2024-04-05 NOTE — Hospital Course (Addendum)
 Patient with PMH of right-sided breast cancer, CKD 3A, SVT, HTN, esophagitis, esophageal stricture, duodenal obstruction presented to the hospital with complaints of abdominal pain with nausea and vomiting. Found to have severe electrolyte abnormalities as well. GI, nephrology, oncology and palliative care following.  Assessment and Plan: Intractable nausea and vomiting. CT abdomen without any evidence of obstruction. GI consulted. Palliative care also consulted. Decadron  recommended by palliative care for refractory nausea and vomiting. On Phenergan  and Decadron . Zyprexa  . GI also consulted for now no new recommendation. Continue PPI.   Moderate protein calorie malnutrition. Secondary poor p.o. intake due to ongoing GI symptoms. Significant concern for patient's prognosis. Patient unable to tolerate tube feeding via tubes in the past. 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 Patient does not use G-tube for nutrition. Will monitor for improvement in symptoms. Appreciate palliative care consultation.   Acute on chronic abdominal pain related to cancer. Patient is on fentanyl  patch chronically.  Dose increased on 10/15 from 25-37.5. Also on as needed IV Dilaudid . He recently underwent celiac plexus block without any significant improvement.   Severe hypokalemia, severe hypocalcemia, hypophosphatemia. Hypomagnesemia. Currently being treated aggressively. Appreciate oncology consultation. It appears that the patient received denosumab  in the past which could be related to her hypercalcemia.   Goals of care conversation. Appreciate Palliative care  consultation. Currently full code.  Wants full code scope of treatment. Prognosis is poor though.  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 Left lower quadrant  enterocutaneous fistula. S/p laparoscopic gastrojejunostomy bypass 01/02/24 by Dr. Lyndel Subsequently underwent G tube and J tube placement 01/10/24 Readmitted 8/14 for FTT and G and J tube malposition. G tube replaced by IR 8/15. J tube ultimately removed due to obstruction.  Eakins pouch in place, low output. Wound care consulted due to leaking.  Currently no evidence of active infection. not a candidate for surgical management of possible primary pancreatic malignancy in the setting of her metastatic breast cancer with carcinomatosis.  Has a venting G-tube.  Which is currently being used for medications.  Vitamin D  deficiency. Replacing with D2.  Bleeding from the enterocutaneous fistula. Patient has a jejunal fistula which led to some bleeding on the morning of 10/17. Discussed with general surgery, recommend close monitoring. Currently holding Lovenox . H&H every 12 hours.

## 2024-04-05 NOTE — Progress Notes (Signed)
 [facility] Progress Note Patient: Chelsea Hansen FMW:[id number] DOB: 06-28-1970  DOA: 04/01/2024 DOS: the patient was seen and examined on 04/05/2024  Brief Hospital Course: Patient with PMH of right-sided breast cancer, CKD 3A, SVT, HTN, esophagitis, esophageal stricture, duodenal obstruction presented to the hospital with complaints of abdominal pain with nausea and vomiting. Found to have severe electrolyte abnormalities as well. GI, nephrology, oncology and palliative care following.  Assessment and Plan: Intractable nausea and vomiting. CT abdomen without any evidence of obstruction. GI consulted. Palliative care also consulted. Decadron  recommended by palliative care for refractory nausea and vomiting. On Phenergan . Zyprexa  recommended. GI also consulted for now no new recommendation. Continue PPI.   Moderate protein calorie malnutrition. Secondary poor p.o. intake due to ongoing GI symptoms. Significant concern for patient's prognosis. Patient unable to tolerate tube feeding via tubes in the past. 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 Patient does not use G-tube for nutrition. Will monitor for improvement in symptoms. Appreciate palliative care consultation.   Acute on chronic abdominal pain related to cancer. Patient is on fentanyl  patch chronically.  Dose increased on 10/15 from 25-37.5. Also on as needed IV Dilaudid . He recently underwent celiac plexus block without any significant improvement.   Severe hypokalemia, severe hypocalcemia, hypophosphatemia. Hypomagnesemia. Currently being treated aggressively. Appreciate oncology consultation. It appears that the patient received denosumab  in the past which could be related to her hypercalcemia.   Goals of care conversation. Appreciate Palliative care  consultation. Currently full code.  Wants full code scope of treatment. Prognosis is poor  though.  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 Left lower quadrant enterocutaneous fistula. S/p laparoscopic gastrojejunostomy bypass 01/02/24 by Dr. Lyndel Subsequently underwent G tube and J tube placement 01/10/24 Readmitted 8/14 for FTT and G and J tube malposition. G tube replaced by IR 8/15. J tube ultimately removed due to obstruction.  Eakins pouch in place, low output. Wound care consulted due to leaking.  Currently no evidence of active infection. not a candidate for surgical management of possible primary pancreatic malignancy in the setting of her metastatic breast cancer with carcinomatosis.  Has a venting G-tube.  Which is currently being used for medications.  Vitamin D  deficiency. Replacing with D2.   Subjective: Ongoing nausea without any vomiting today.  No fever no chills.  Abdominal pain unchanged.  No BM.  Physical Exam: Clear to auscultation. S1-S2 present and bowel sound present.  Diffusely tender. Trace edema.  Data Reviewed: I have Reviewed nursing notes, Vitals, and Lab results. Since last encounter, pertinent lab results CBC and BMP   . I have ordered test including CBC and BMP  . I have discussed pt's care plan and test results with nephrology  .   Disposition: Status is: Inpatient Remains inpatient appropriate because: Monitor for improvement in abdominal pain and oral intake  enoxaparin  (LOVENOX ) injection 40 mg Start: 04/05/24 1130   Family Communication: No one at bedside Level of care: Telemetry   Vitals:   04/04/24 0617 04/04/24 1216 04/04/24 2026 04/05/24 1347  BP:  96/70 116/80 126/83  Pulse:  74 69 70  Resp:  20 12 19   Temp:  98 F (36.7 C) 97.8 F (36.6 C) 98.8 F (37.1 C)  TempSrc:   Oral   SpO2:  100% 100% 100%  Weight: 75.2 kg     Height:  Author: Yetta Blanch, MD 04/05/2024 2:26 PM  Please  look on [email or web address] to find out who is on call.

## 2024-04-05 NOTE — Progress Notes (Signed)
 Chelsea Hansen   DOB:12/31/1970   FM#:[id number]   RDW#:[id number]  Subjective: Chelsea Hansen is a 53 year old with mestastic breast cancer to bone, peritoneum, pancreas with duodenal obstruction s/p G tube who was admitted on 10/12 for nausea, vomiting, diarrhea, fall, and electrolyte abnormalities.   She is lying in bed.   she has been receiving IV calcium  more frequently.  Her labs are improving.  Her nausea and vomiting is improving.  She denies any diarrhea. She wants to know when she can receive chemotherapy.     Objective:  Vitals:   04/04/24 2026 04/05/24 1347  BP: 116/80 126/83  Pulse: 69 70  Resp: 12 19  Temp: 97.8 F (36.6 C) 98.8 F (37.1 C)  SpO2: 100% 100%    Body mass index is 23.12 kg/m.  Intake/Output Summary (Last 24 hours) at 04/05/2024 1459 Last data filed at 04/05/2024 1305 Gross per 24 hour  Intake 2786.66 ml  Output --  Net 2786.66 ml     Sclerae unicteric  Oropharynx shows no thrush or other lesions  No cervical or supraclavicular adenopathy  Lungs no rales or wheezes--auscultated anterolaterally  Heart regular rate and rhythm  Abdomen soft, +BS  Neuro nonfocal     Labs:  Lab Results  Component Value Date   WBC 3.9 (L) 04/05/2024   HGB 9.5 (L) 04/05/2024   HCT 29.6 (L) 04/05/2024   MCV 82.2 04/05/2024   PLT 180 04/05/2024   NEUTROABS 1.8 04/03/2024    Basic Metabolic Panel: Recent Labs  Lab 03/30/24 0914 04/01/24 1825 04/01/24 2234 04/02/24 0228 04/03/24 0540 04/04/24 0255 04/04/24 1808 04/05/24 0014  NA 137   < >  --  137 136 137 137 134*  K 3.2*   < >  --  3.2* 4.0 4.1 4.0 4.7  CL 109   < >  --  108 107 106 105 103  CO2 22   < >  --  21* 19* 22 22 23   GLUCOSE 96   < >  --  83 87 78 113* 160*  BUN <5*   < >  --  <5* <5* <5* <5* <5*  CREATININE 0.32*   < >  --  <0.30* <0.30* <0.30* <0.30* <0.30*  CALCIUM  6.0*   < >  --  5.3* 4.9* 5.3* 6.0* 6.5*  MG 1.4*  --  1.6* 1.6*  --  2.0  --  2.5*  PHOS  --   --   --  1.5* 1.8* 2.2*  --  1.9*    < > = values in this interval not displayed.     Liver Function Tests: Recent Labs  Lab 03/30/24 0914 04/01/24 1825 04/04/24 0255 04/05/24 0014  AST 14* 22  --   --   ALT 22 23  --   --   ALKPHOS 893* 809*  --   --   BILITOT 0.5 0.4  --   --   PROT 5.9* 5.7*  --   --   ALBUMIN  3.2* 3.3* 2.9* 3.3*     Assessment/Plan: 53 y.o. metastatic breast cancer to bone, peritoneum, pancreas on treatment with Enhertu  admitted with nausea, vomiting, diarrhea, and electrolyte abnormalities.     Metastatic breast cancer: Treatment with Enhertu  is on hold.  I let her know that she must recover from this hospital admission in order for us  to be able to give her treatment.  We reviewed her scans do not show progression of her breast cancer and that she will  need to receive more treatment for us  to understand if the treatment has had a meaningful effect.   Electrolyte abnormalities.  Hypocalcemia secondary to Xgeva  she received on 9/24 when she received her last treatment.  This is a prolonged effect and after discussion with nephrology she should not receive bisphosphanates further.  We have discontinued these from her treatment plan. Appreciate nephrology recommendations and care.   Nausea/vomiting: appreciate palliative care recommendations and care.  Since adding dexamethasone  and zyprexa , patient is feeling improved.  Cancer related pain: fentanyl  patch with dilaudid  iv prn.  She reports her pain is controlled with this regimen.   Other medical problems as per hospitalist team.  Thank you for your excellent care of this patient.       Chelsea JAYSON Kendall, NP 04/05/2024  2:59 PM Medical Oncology and Hematology [facility] [address] [place], [place] [address] Tel. [phone]    Fax. [phone]

## 2024-04-06 ENCOUNTER — Inpatient Hospital Stay: Payer: MEDICAID

## 2024-04-06 DIAGNOSIS — E878 Other disorders of electrolyte and fluid balance, not elsewhere classified: Secondary | ICD-10-CM | POA: Diagnosis not present

## 2024-04-06 DIAGNOSIS — R112 Nausea with vomiting, unspecified: Secondary | ICD-10-CM | POA: Diagnosis not present

## 2024-04-06 DIAGNOSIS — R197 Diarrhea, unspecified: Secondary | ICD-10-CM | POA: Diagnosis not present

## 2024-04-06 LAB — RENAL FUNCTION PANEL
Albumin: 3.3 g/dL — ABNORMAL LOW (ref 3.5–5.0)
Anion gap: 9 (ref 5–15)
BUN: <5 mg/dL — ABNORMAL LOW (ref 6–20)
CO2: 24 mmol/L (ref 22–32)
Calcium: 6 mg/dL — CL (ref 8.9–10.3)
Chloride: 105 mmol/L (ref 98–111)
Creatinine, Ser: <0.3 mg/dL — ABNORMAL LOW (ref 0.44–1.00)
Glucose, Bld: 93 mg/dL (ref 70–99)
Phosphorus: 1.8 mg/dL — ABNORMAL LOW (ref 2.5–4.6)
Potassium: 3.9 mmol/L (ref 3.5–5.1)
Sodium: 137 mmol/L (ref 135–145)

## 2024-04-06 LAB — CBC
HCT: 27.9 % — ABNORMAL LOW (ref 36.0–46.0)
Hemoglobin: 8.7 g/dL — ABNORMAL LOW (ref 12.0–15.0)
MCH: 25.7 pg — ABNORMAL LOW (ref 26.0–34.0)
MCHC: 31.2 g/dL (ref 30.0–36.0)
MCV: 82.3 fL (ref 80.0–100.0)
Platelets: 179 K/uL (ref 150–400)
RBC: 3.39 MIL/uL — ABNORMAL LOW (ref 3.87–5.11)
RDW: 17.2 % — ABNORMAL HIGH (ref 11.5–15.5)
WBC: 4.3 K/uL (ref 4.0–10.5)
nRBC: 0.7 % — ABNORMAL HIGH (ref 0.0–0.2)

## 2024-04-06 LAB — MAGNESIUM: Magnesium: 2 mg/dL (ref 1.7–2.4)

## 2024-04-06 MED ORDER — CALCIUM GLUCONATE-NACL 2-0.675 GM/100ML-% IV SOLN
2.0000 g | Freq: Once | INTRAVENOUS | Status: DC
  Filled 2024-04-06: qty 100

## 2024-04-06 MED ORDER — PANTOPRAZOLE SODIUM 40 MG IV SOLR
40.0000 mg | INTRAVENOUS | Status: DC
  Administered 2024-04-06 – 2024-04-19 (×14): 40 mg via INTRAVENOUS
  Filled 2024-04-06 (×15): qty 10

## 2024-04-06 MED ORDER — CALCIUM GLUCONATE-NACL 2-0.675 GM/100ML-% IV SOLN
2.0000 g | Freq: Three times a day (TID) | INTRAVENOUS | Status: DC
  Administered 2024-04-06 – 2024-04-08 (×6): 2000 mg via INTRAVENOUS
  Filled 2024-04-06 (×7): qty 100

## 2024-04-06 MED ORDER — CALCIUM CARBONATE ANTACID 1250 MG/5ML PO SUSP
1500.0000 mg | Freq: Three times a day (TID) | ORAL | Status: DC
  Administered 2024-04-06 (×2): 1500 mg
  Filled 2024-04-06 (×4): qty 15

## 2024-04-06 MED ORDER — SODIUM PHOSPHATES 45 MMOLE/15ML IV SOLN
30.0000 mmol | Freq: Once | INTRAVENOUS | Status: AC
  Administered 2024-04-06: 30 mmol via INTRAVENOUS
  Filled 2024-04-06: qty 10

## 2024-04-06 MED ORDER — CALCIUM GLUCONATE-NACL 2-0.675 GM/100ML-% IV SOLN
2.0000 g | Freq: Once | INTRAVENOUS | Status: AC
  Administered 2024-04-06: 2000 mg via INTRAVENOUS
  Filled 2024-04-06: qty 100

## 2024-04-06 NOTE — Progress Notes (Signed)
 [facility] Progress Note  Subjective:  No new c/o's Ca++ dropped to 6.0 this am  Vitals:   04/05/24 1954 04/06/24 0451 04/06/24 0505 04/06/24 1226  BP: 124/81 109/68  111/73  Pulse: 68 68  61  Resp: 18 18  18   Temp: 98.9 F (37.2 C) 98.8 F (37.1 C)  98 F (36.7 C)  TempSrc: Oral Oral  Oral  SpO2: 100% 98%  100%  Weight:   76 kg   Height:        Exam: Gen alert, no distress Sclera anicteric, throat clear  No jvd or bruits Chest clear bilat to bases RRR no MRG Abd soft ntnd no mass or ascites +bs Ext no LE or UE edema, no other edema Neuro is alert, Ox 3 , nf     Vit D, 25 hydroxy = 7.9 (30-100) Vit 1, 25 dihydroxy = 94 (24- 81) PTH = 207 (15- 65) Today's lab --> Mg ^ 2.5    Ca ^6.5 (CCa = 7.0)       Assessment/ Plan: 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 Has been getting denosumab  every 3 mos since dec 2023, 8 doses in total, the last dose was 03/14/24. Her vit D3 is very low, just came back. 1,25 vit D is high and PTH is slightly high at 207. Denosumab  therapy, ongoing for last 22 months, is the likely cause of her hypocalcemia. Low vit D 25 is contributing. Renal function is normal. Denosumab  inhibits osteoclast function, so the process of using bone breakdown to keep blood Ca++ levels up is blocked. Bone building is still happening, so the 2 are out of balance = more Ca++ going into bones than coming out. When the drug eventually is fully metabolized (about 1-3 months), presumably her hypocalcemia issues should be fixed. Needs to avoid future anti-resorbtive agents. Until then needs aggressive IV then po Ca replacement. Also need to maintain Mg levels.  - resume IV Ca gluconate 2 gm q 8 hrs - cont activated vit D (1, 25 hydroxy) 0.25 mcg bid - ^ po liquid CaCO3 via tube to 1.5 gm elemental tid - continue high dose 25 hydroxy vit D, defer to pmd  Hypomagnesemia: Mg was low at 1.6, then corrected  on magnesium  sulfate IV 2gm tid to 2.5. IV Mg was dc'd and we started po Mg oxide.   - cont Mg oxide 400mg  bid per tube   Hypophosphatemia: appreciate pharm/ pmd assist w/ replenishing Hypokalemia: appreciate pharm/ pmd assist w/ replenishing Volume: cont IVF (bicarb) at 75 cc/hr.  Metabolic acidosis: will use bicarb drip at 75 cc/hr.  Breast cancer: w/ peritoneal and bony mets       Myer Fret MD  [facility] 04/06/2024, 4:09 PM  Recent Labs  Lab 04/05/24 0014 04/05/24 1748 04/06/24 0434  HGB 9.5*  --  8.7*  ALBUMIN  3.3*  --  3.3*  CALCIUM  6.5* 6.7* 6.0*  PHOS 1.9*  --  1.8*  CREATININE <0.30* 0.34* <0.30*  K 4.7 4.7 3.9   No results for input(s): IRON, TIBC, FERRITIN in the last 168 hours. Inpatient medications:  alteplase  2 mg Intracatheter Once   calcitRIOL   0.25 mcg Per Tube BID   calcium  carbonate (dosed in mg elemental calcium )  1,500 mg of elemental calcium  Per Tube TID BM   Chlorhexidine  Gluconate Cloth  6 each Topical Daily   dexamethasone  (DECADRON ) injection  4 mg Intravenous Q12H   ergocalciferol  (VITAMIN D2)  8,000 Units Per Tube Daily   feeding supplement  1 Container Oral TID WC   feeding supplement (KATE FARMS STANDARD 1.4)  325 mL Oral BID BM   fentaNYL   1 patch Transdermal Q72H   fentaNYL   1 patch Transdermal Q72H   magnesium  oxide  400 mg Oral BID   multivitamin with minerals  1 tablet Per Tube Daily   OLANZapine  zydis  5 mg Oral QHS   pantoprazole  (PROTONIX ) IV  40 mg Intravenous Q24H   sodium chloride  flush  10-40 mL Intracatheter Q12H   thiamine   100 mg Per Tube Daily    calcium  gluconate     sodium bicarbonate 150 mEq in sterile water  1,150 mL infusion 75 mL/hr at 04/06/24 0216   sodium PHOSPHATE  IVPB (in mmol) 30 mmol (04/06/24 1205)   HYDROmorphone  (DILAUDID ) injection, naLOXone  (NARCAN )  injection, oxyCODONE , prochlorperazine , sodium chloride  flush

## 2024-04-06 NOTE — Progress Notes (Signed)
 [facility] Progress Note Patient: Chelsea Hansen FMW:[id number] DOB: 09/21/1970  DOA: 04/01/2024 DOS: the patient was seen and examined on 04/06/2024  Brief Hospital Course: Patient with PMH of right-sided breast cancer, CKD 3A, SVT, HTN, esophagitis, esophageal stricture, duodenal obstruction presented to the hospital with complaints of abdominal pain with nausea and vomiting. Found to have severe electrolyte abnormalities as well. GI, nephrology, oncology and palliative care following.  Assessment and Plan: Intractable nausea and vomiting. CT abdomen without any evidence of obstruction. GI consulted. Palliative care also consulted. Decadron  recommended by palliative care for refractory nausea and vomiting. On Phenergan  and Decadron . Zyprexa  . GI also consulted for now no new recommendation. Continue PPI.   Moderate protein calorie malnutrition. Secondary poor p.o. intake due to ongoing GI symptoms. Significant concern for patient's prognosis. Patient unable to tolerate tube feeding via tubes in the past. 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 Patient does not use G-tube for nutrition. Will monitor for improvement in symptoms. Appreciate palliative care consultation.   Acute on chronic abdominal pain related to cancer. Patient is on fentanyl  patch chronically.  Dose increased on 10/15 from 25-37.5. Also on as needed IV Dilaudid . He recently underwent celiac plexus block without any significant improvement.   Severe hypokalemia, severe hypocalcemia, hypophosphatemia. Hypomagnesemia. Currently being treated aggressively. Appreciate oncology consultation. It appears that the patient received denosumab  in the past which could be related to her hypercalcemia.   Goals of care conversation. Appreciate Palliative care  consultation. Currently full code.  Wants full code scope of treatment. Prognosis is poor  though.  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 Left lower quadrant enterocutaneous fistula. S/p laparoscopic gastrojejunostomy bypass 01/02/24 by Dr. Lyndel Subsequently underwent G tube and J tube placement 01/10/24 Readmitted 8/14 for FTT and G and J tube malposition. G tube replaced by IR 8/15. J tube ultimately removed due to obstruction.  Eakins pouch in place, low output. Wound care consulted due to leaking.  Currently no evidence of active infection. not a candidate for surgical management of possible primary pancreatic malignancy in the setting of her metastatic breast cancer with carcinomatosis.  Has a venting G-tube.  Which is currently being used for medications.  Vitamin D  deficiency. Replacing with D2.  Bleeding from the enterocutaneous fistula. Patient has a jejunal fistula which led to some bleeding on the morning of 10/17. Discussed with general surgery, recommend close monitoring. Currently holding Lovenox . H&H every 12 hours.   Subjective: No nausea or vomiting.  No fever no chills.  Reported overnight bleeding from the vaginal introitus fistula.  No bleeding anywhere else.  No abdominal pain.    Physical Exam: Clear to auscultation. S1-S2 present No further bleeding seen from the enterocutaneous fistula. Abdomen is soft. Trace edema.  Data Reviewed: I have Reviewed nursing notes, Vitals, and Lab results. Since last encounter, pertinent lab results CBC BMP   . I have ordered test including CBC and BMP  .  Discussed with general surgery. Disposition: Status is: Inpatient Remains inpatient appropriate because: Monitor for improvement in electrolyte control  Family Communication: Family at bedside. Level of care: Telemetry   Vitals:   04/05/24 1954 04/06/24 0451 04/06/24 0505 04/06/24 1226  BP: 124/81 109/68  111/73  Pulse: 68 68  61   Resp: 18 18  18   Temp: 98.9 F (37.2 C) 98.8 F (37.1 C)  98 F (36.7 C)  TempSrc: Oral Oral  Oral  SpO2: 100% 98%  100%  Weight:   76 kg   Height:         Author: Yetta Blanch, MD 04/06/2024 6:19 PM  Please look on [email or web address] to find out who is on call.

## 2024-04-06 NOTE — Plan of Care (Signed)
  Problem: Clinical Measurements: Goal: Respiratory complications will improve Outcome: Progressing Goal: Cardiovascular complication will be avoided Outcome: Progressing   Problem: Activity: Goal: Risk for activity intolerance will decrease Outcome: Progressing   Problem: Nutrition: Goal: Adequate nutrition will be maintained Outcome: Progressing   Problem: Coping: Goal: Level of anxiety will decrease Outcome: Progressing   Problem: Pain Managment: Goal: General experience of comfort will improve and/or be controlled Outcome: Progressing   Problem: Safety: Goal: Ability to remain free from injury will improve Outcome: Progressing   Problem: Skin Integrity: Goal: Risk for impaired skin integrity will decrease Outcome: Progressing

## 2024-04-06 NOTE — Progress Notes (Signed)
 PMT brief progress note  Patient was briefly seen earlier this am, she was resting peacefully, no distress noted.  Chart reviewed Medication history noted Oncology TRH and TOC notes reviewed BP 111/73 (BP Location: Right Arm)   Pulse 61   Temp 98 F (36.7 C) (Oral)   Resp 18   Ht 5' 11 (1.803 m)   Wt 76 kg   SpO2 100%   BMI 23.37 kg/m  Resting comfortably No distress currently Tolerating PO Nausea/vomiting improving Ca levels improving, being monitored closely.  From palliative standpoint - continue current pain and anti emetic regimen for now PMT to follow up over the weekend Low MDM Lonia Serve MD [facility].

## 2024-04-06 NOTE — Progress Notes (Addendum)
 Critical Calcium  6.0  Previous Calcium  6.7  Patient received a dose of calcium  gluconate at 0514 (See MAR). Lab drawn prior to patient receiving this dose.   RN notified Lavanda Horns, NP via page  Orders Received: Calcium  Gluconate to be administered at 1200

## 2024-04-06 NOTE — Plan of Care (Signed)
  Problem: Education: Goal: Knowledge of General Education information will improve Description: Including pain rating scale, medication(s)/side effects and non-pharmacologic comfort measures Outcome: Progressing   Problem: Health Behavior/Discharge Planning: Goal: Ability to manage health-related needs will improve Outcome: Progressing   Problem: Clinical Measurements: Goal: Ability to maintain clinical measurements within normal limits will improve Outcome: Progressing Goal: Will remain free from infection Outcome: Progressing Goal: Diagnostic test results will improve Outcome: Progressing Goal: Respiratory complications will improve Outcome: Progressing Goal: Cardiovascular complication will be avoided Outcome: Progressing   Problem: Activity: Goal: Risk for activity intolerance will decrease Outcome: Progressing   Problem: Nutrition: Goal: Adequate nutrition will be maintained Outcome: Progressing   Problem: Coping: Goal: Level of anxiety will decrease Outcome: Progressing   Problem: Elimination: Goal: Will not experience complications related to bowel motility Outcome: Progressing Goal: Will not experience complications related to urinary retention Outcome: Progressing   Problem: Pain Managment: Goal: General experience of comfort will improve and/or be controlled Outcome: Progressing   Problem: Safety: Goal: Ability to remain free from injury will improve Outcome: Progressing   Problem: Skin Integrity: Goal: Risk for impaired skin integrity will decrease Outcome: Progressing   Problem: Nutrition Goal: Nutritional status is improving Description: Monitor and assess patient for malnutrition (ex- brittle hair, bruises, dry skin, pale skin and conjunctiva, muscle wasting, smooth red tongue, and disorientation). Collaborate with interdisciplinary team and initiate plan and interventions as ordered.  Monitor patient's weight and dietary intake as ordered or per  policy. Utilize nutrition screening tool and intervene per policy. Determine patient's food preferences and provide high-protein, high-caloric foods as appropriate.  Outcome: Progressing

## 2024-04-06 NOTE — TOC Progression Note (Addendum)
 Transition of Care [facility]) - Progression Note    Patient Details  Name: Chelsea Hansen MRN: [id number] Date of Birth: [birth year]  [facility]) CM/SW Contact  Alfonse JONELLE Rex, RN Phone Number: 04/06/2024, 1:28 PM  Clinical Narrative:   Per 10/16 Oncology progress note: patient with Metastatic breast cancer , electrolyte abnormalities, nausea and vomiting, G-tube in place,  Palliative Medicine Team following,  multiple specialist teams following. TOC will continue to follow for dc needs.                       Expected Discharge Plan and Services                                               Social Drivers of Health (SDOH) Interventions SDOH Screenings   Food Insecurity: No Food Insecurity (04/01/2024)  Recent Concern: Food Insecurity - Food Insecurity Present (02/16/2024)  Housing: Low Risk  (04/02/2024)  Transportation Needs: No Transportation Needs (04/01/2024)  Utilities: Not At Risk (04/01/2024)  Depression (PHQ2-9): Medium Risk (03/30/2024)  Financial Resource Strain: High Risk (02/13/2024)  Physical Activity: Inactive (02/13/2024)  Social Connections: Moderately Isolated (04/01/2024)  Stress: No Stress Concern Present (02/13/2024)  Tobacco Use: Low Risk  (04/02/2024)  Recent Concern: Tobacco Use - High Risk (01/27/2024)   Received from [facility]  Health Literacy: Adequate Health Literacy (02/13/2024)    Readmission Risk Interventions    03/20/2024    3:17 PM 03/05/2024    4:03 PM 12/07/2023   12:21 PM  Readmission Risk Prevention Plan  Transportation Screening  Complete Complete  Medication Review (RN Care Manager) Complete Complete Complete  PCP or Specialist appointment within 3-5 days of discharge Complete Complete Complete  HRI or Home Care Consult Complete Complete Complete  SW Recovery Care/Counseling Consult Complete Complete Complete  Palliative Care Screening Not Applicable Not Applicable Not Applicable   Skilled Nursing Facility Not Applicable Not Applicable Not Applicable

## 2024-04-06 NOTE — Progress Notes (Addendum)
 RN received in handoff shift report from Jasmine Smith, dayshift RN, that the patient's port had been de-accessed and re-accessed during the day d/t it not flushing. Per report, when the IV team RN changed out the port needle the IV team nurse stated the tubing was completely crystallized. Per report, The IV team RN stated the port can be used only for the calcium  gluconate and nothing else.  IV team placed a PIV for the sodium bicarb to infuse.  RN notified Lavanda Horns, NP of the above information d/t no note in the chart stating the above information.    Orders Received: write note in chart regarding this information

## 2024-04-07 DIAGNOSIS — E878 Other disorders of electrolyte and fluid balance, not elsewhere classified: Secondary | ICD-10-CM | POA: Diagnosis not present

## 2024-04-07 LAB — CBC
HCT: 26.4 % — ABNORMAL LOW (ref 36.0–46.0)
HCT: 26.8 % — ABNORMAL LOW (ref 36.0–46.0)
Hemoglobin: 8.4 g/dL — ABNORMAL LOW (ref 12.0–15.0)
Hemoglobin: 8.5 g/dL — ABNORMAL LOW (ref 12.0–15.0)
MCH: 26.7 pg (ref 26.0–34.0)
MCH: 26.5 pg (ref 26.0–34.0)
MCHC: 31.8 g/dL (ref 30.0–36.0)
MCHC: 31.7 g/dL (ref 30.0–36.0)
MCV: 83.8 fL (ref 80.0–100.0)
MCV: 83.5 fL (ref 80.0–100.0)
Platelets: 161 K/uL (ref 150–400)
Platelets: 165 K/uL (ref 150–400)
RBC: 3.15 MIL/uL — ABNORMAL LOW (ref 3.87–5.11)
RBC: 3.21 MIL/uL — ABNORMAL LOW (ref 3.87–5.11)
RDW: 17.2 % — ABNORMAL HIGH (ref 11.5–15.5)
RDW: 17 % — ABNORMAL HIGH (ref 11.5–15.5)
WBC: 5 K/uL (ref 4.0–10.5)
WBC: 5.8 K/uL (ref 4.0–10.5)
nRBC: 0 % (ref 0.0–0.2)
nRBC: 1.2 % — ABNORMAL HIGH (ref 0.0–0.2)

## 2024-04-07 LAB — RENAL FUNCTION PANEL
Albumin: 3.2 g/dL — ABNORMAL LOW (ref 3.5–5.0)
Anion gap: 10 (ref 5–15)
BUN: 5 mg/dL — ABNORMAL LOW (ref 6–20)
CO2: 22 mmol/L (ref 22–32)
Calcium: 6.9 mg/dL — ABNORMAL LOW (ref 8.9–10.3)
Chloride: 105 mmol/L (ref 98–111)
Creatinine, Ser: 0.37 mg/dL — ABNORMAL LOW (ref 0.44–1.00)
GFR, Estimated: >60 mL/min (ref >60)
Glucose, Bld: 136 mg/dL — ABNORMAL HIGH (ref 70–99)
Phosphorus: 1.8 mg/dL — ABNORMAL LOW (ref 2.5–4.6)
Potassium: 3.9 mmol/L (ref 3.5–5.1)
Sodium: 136 mmol/L (ref 135–145)

## 2024-04-07 LAB — CALCIUM: Calcium: 6.8 mg/dL — ABNORMAL LOW (ref 8.9–10.3)

## 2024-04-07 LAB — MAGNESIUM: Magnesium: 1.8 mg/dL (ref 1.7–2.4)

## 2024-04-07 MED ORDER — CALCIUM CARBONATE ANTACID 1250 MG/5ML PO SUSP
2000.0000 mg | Freq: Three times a day (TID) | ORAL | Status: DC
  Administered 2024-04-07 – 2024-04-08 (×4): 2000 mg
  Filled 2024-04-07 (×5): qty 20

## 2024-04-07 MED ORDER — SODIUM PHOSPHATES 45 MMOLE/15ML IV SOLN
30.0000 mmol | Freq: Once | INTRAVENOUS | Status: AC
  Administered 2024-04-07: 30 mmol via INTRAVENOUS
  Filled 2024-04-07: qty 10

## 2024-04-07 MED ORDER — SORBITOL 70 % SOLN
30.0000 mL | Freq: Every day | Status: DC
  Administered 2024-04-08 – 2024-04-11 (×4): 30 mL
  Filled 2024-04-07 (×4): qty 30

## 2024-04-07 MED ORDER — BACLOFEN 10 MG PO TABS
5.0000 mg | ORAL_TABLET | Freq: Once | ORAL | Status: AC
  Administered 2024-04-07: 5 mg
  Filled 2024-04-07: qty 1

## 2024-04-07 MED ORDER — BACLOFEN 10 MG PO TABS: 5.0000 mg | ORAL_TABLET | Freq: Once | ORAL | Status: DC

## 2024-04-07 MED ORDER — ENOXAPARIN SODIUM 30 MG/0.3ML IJ SOSY
30.0000 mg | PREFILLED_SYRINGE | Freq: Every day | INTRAMUSCULAR | Status: DC
  Administered 2024-04-07 – 2024-04-08 (×2): 30 mg via SUBCUTANEOUS
  Filled 2024-04-07 (×2): qty 0.3

## 2024-04-07 MED ORDER — DOCUSATE SODIUM 50 MG/5ML PO LIQD
200.0000 mg | Freq: Every day | ORAL | Status: DC
  Administered 2024-04-07 – 2024-04-11 (×4): 200 mg
  Filled 2024-04-07 (×4): qty 20

## 2024-04-07 NOTE — Progress Notes (Signed)
 PMT progress note  Patient seen sitting up in bed, no distress, symptoms a re reasonably well controlled, she has been able to eat about 90% of her meals in the past 24 hours. She states she remains committed to continuing with full code full scope care and is hopeful to receive more chemotherapy soon.   Chart reviewed Medication history noted Oncology TRH and TOC notes reviewed BP (!) 96/58 (BP Location: Left Arm)   Pulse (!) 55   Temp 98.1 F (36.7 C) (Oral)   Resp 18   Ht 5' 11 (1.803 m)   Wt 75.1 kg   SpO2 99%   BMI 23.09 kg/m  Resting comfortably No distress currently Tolerating PO Nausea/vomiting improving Ca levels improving, being monitored closely. 6.9 on 04-07-24. Renal services also following along, patient on IVF Bicarb, patient on Mag replacement, patient on Ca gluconate IV, PO Vit D. Shared with patient about her current Ca level as per her request.  As per oncology documentation - patient remains invested in continuing cancer directed therapies, remains full code, full scope care for now. Patient already a patient of my colleague Ms Cousar NP Palliative at the cancer center.  From palliative standpoint - continue current pain and anti emetic regimen for now 6 mg IV Dilaudid  used in the past 24 hours. Also has Oxy IR PO PRN available, but primarily using IV Dilaudid  for breakthrough pain as of now, will re discuss with patient regarding trying PO PRN opioids for acute/breakthrough pain.  Will increase TDF from 37 mcg Q 72 hours to 50 mcg Q 72 hours and monitor.  Remains on IV Dexamethasone , once symptoms more better controlled - will switch to decadron  taper.  Remains on Zyprexa  low dose Q HS for now for anti emetic assistance.   PMT to follow    Mod MDM Lonia Serve MD [facility].

## 2024-04-07 NOTE — Progress Notes (Signed)
 Mobility Specialist - Progress Note   04/07/24 1300  Mobility  Activity Ambulated with assistance  Level of Assistance Standby assist, set-up cues, supervision of patient - no hands on  Assistive Device Front wheel walker  Distance Ambulated (ft) 130 ft  Range of Motion/Exercises Active  Activity Response Tolerated well  Mobility Referral Yes  Mobility visit 1 Mobility  Mobility Specialist Start Time (ACUTE ONLY) 1305  Mobility Specialist Stop Time (ACUTE ONLY) 1322  Mobility Specialist Time Calculation (min) (ACUTE ONLY) 17 min   Received in bed and agreed to mobility. Had no issues throughout session, returned to chair with all needs met.  Cyndee Ada Mobility Specialist

## 2024-04-07 NOTE — Progress Notes (Signed)
 [facility] Progress Note  Subjective:  No new c/o's Ca++ up to 6.6 today Mg 2.0 Pt feeling okay, no issues  Vitals:   04/06/24 2019 04/07/24 0216 04/07/24 0504 04/07/24 1240  BP: 107/67 112/63 (!) 96/58 97/64  Pulse: 71 64 (!) 55 64  Resp: 16  18 16   Temp: 98.3 F (36.8 C)  98.1 F (36.7 C) 98.3 F (36.8 C)  TempSrc: Oral  Oral Oral  SpO2: 97% 99% 99% 98%  Weight:   75.1 kg   Height:        Exam: Gen alert, no distress Sclera anicteric, throat clear  No jvd or bruits Chest clear bilat to bases RRR no MRG Abd soft ntnd no mass or ascites +bs Ext no LE or UE edema, no other edema Neuro is alert, Ox 3 , nf     Vit D, 25 hydroxy = 7.9 (30-100) Vit 1, 25 dihydroxy = 94 (24- 81) PTH = 207 (15- 65) Today's lab --> Mg ^ 2.5    Ca ^6.5 (CCa = 7.0)       Assessment/ Plan: 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 Has been getting denosumab  every 3 mos since dec 2023, 8 doses in total, the last dose was 03/14/24. Her vit D3 is very low, just came back. 1,25 vit D is high and PTH is slightly high at 207. Denosumab  therapy, ongoing for last 22 months, is the likely cause of her hypocalcemia. Low vit D 25 is contributing. Renal function is normal. Denosumab  inhibits osteoclast function, so the process of using bone breakdown to keep blood Ca++ levels up is blocked. Bone building is still happening, so the 2 are out of balance = more Ca++ going into bones than coming out. When the drug eventually is fully metabolized (about 1-3 months), presumably her hypocalcemia issues should be fixed. Needs to avoid future anti-resorptive agents. Until then needs aggressive IV replacement, then continued high-level CaCO3 and activated vit D upon discharge. Also need to maintain Mg levels.  - resume IV Ca gluconate 2 gm q 8 hrs - cont activated vit D (1, 25 hydroxy) 0.25 mcg bid - ^ po liquid CaCO3 via tube to 2.0 gm elemental  tid - continue high dose 25 hydroxy vit D  Hypomagnesemia: Mg was low at 1.6, then corrected on magnesium  sulfate IV 2gm tid to 2.5. IV Mg was dc'd and we started po Mg oxide.   - cont Mg oxide 400mg  bid per tube   Hypophosphatemia: appreciate pharm/ pmd assist w/ replenishing Hypokalemia: appreciate pharm/ pmd assist w/ replenishing Volume: cont IVF (bicarb) at 75 cc/hr.  Metabolic acidosis: cont bicarb drip at 75 cc/hr Breast cancer: w/ peritoneal and bony mets       Myer Fret MD  [facility] 04/07/2024, 4:10 PM  Recent Labs  Lab 04/06/24 0434 04/07/24 0348  HGB 8.7* 8.4*  ALBUMIN  3.3* 3.2*  CALCIUM  6.0* 6.8*  6.9*  PHOS 1.8* 1.8*  CREATININE <0.30* 0.37*  K 3.9 3.9   No results for input(s): IRON, TIBC, FERRITIN in the last 168 hours. Inpatient medications:  calcitRIOL   0.25 mcg Per Tube BID   calcium  carbonate (dosed in mg elemental calcium )  2,000 mg of elemental calcium  Per Tube TID BM   Chlorhexidine  Gluconate Cloth  6 each Topical Daily   dexamethasone  (DECADRON ) injection  4 mg Intravenous Q12H   docusate  200 mg Per Tube Daily   enoxaparin  (LOVENOX ) injection  30  mg Subcutaneous Daily   ergocalciferol  (VITAMIN D2)  8,000 Units Per Tube Daily   feeding supplement  1 Container Oral TID WC   feeding supplement (KATE FARMS STANDARD 1.4)  325 mL Oral BID BM   fentaNYL   1 patch Transdermal Q72H   fentaNYL   1 patch Transdermal Q72H   magnesium  oxide  400 mg Oral BID   multivitamin with minerals  1 tablet Per Tube Daily   OLANZapine  zydis  5 mg Oral QHS   pantoprazole  (PROTONIX ) IV  40 mg Intravenous Q24H   sodium chloride  flush  10-40 mL Intracatheter Q12H   [START ON 04/08/2024] sorbitol   30 mL Per Tube Q0600    calcium  gluconate 2,000 mg (04/07/24 1401)   sodium bicarbonate 150 mEq in sterile water  1,150 mL infusion 75 mL/hr at 04/07/24 1558   sodium PHOSPHATE  IVPB (in mmol) 30 mmol (04/07/24 1227)   HYDROmorphone  (DILAUDID ) injection, naLOXone  (NARCAN )   injection, oxyCODONE , prochlorperazine , sodium chloride  flush

## 2024-04-07 NOTE — Plan of Care (Signed)
  Problem: Clinical Measurements: Goal: Respiratory complications will improve Outcome: Progressing Goal: Cardiovascular complication will be avoided Outcome: Progressing   Problem: Activity: Goal: Risk for activity intolerance will decrease Outcome: Progressing   Problem: Nutrition: Goal: Adequate nutrition will be maintained Outcome: Progressing   Problem: Elimination: Goal: Will not experience complications related to bowel motility Outcome: Progressing Goal: Will not experience complications related to urinary retention Outcome: Progressing   Problem: Pain Managment: Goal: General experience of comfort will improve and/or be controlled Outcome: Progressing   Problem: Safety: Goal: Ability to remain free from injury will improve Outcome: Progressing   Problem: Skin Integrity: Goal: Risk for impaired skin integrity will decrease Outcome: Progressing

## 2024-04-07 NOTE — Progress Notes (Signed)
 [facility] Progress Note Patient: Chelsea Hansen DOB: [birth year]  DOA: 04/01/2024 DOS: the patient was seen and examined on 04/07/2024  Brief Hospital Course: Patient with PMH of right-sided breast cancer, CKD 3A, SVT, HTN, esophagitis, esophageal stricture, duodenal obstruction presented to the hospital with complaints of abdominal pain with nausea and vomiting. Found to have severe electrolyte abnormalities as well. GI, nephrology, oncology and palliative care following.  Assessment and Plan: Intractable nausea and vomiting.  Now resolved. CT abdomen without any evidence of obstruction. GI consulted. Palliative care also consulted. Decadron  recommended by palliative care for refractory nausea and vomiting. On Phenergan  and Decadron . Zyprexa  . GI also consulted for now no new recommendation. Continue PPI.   Moderate protein calorie malnutrition. Secondary poor p.o. intake due to ongoing GI symptoms. Significant concern for patient's prognosis. Patient unable to tolerate tube feeding via tubes in the past. 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 Patient does not use G-tube for nutrition. Will monitor for improvement in symptoms. Appreciate palliative care consultation.   Acute on chronic abdominal pain related to cancer. Patient is on fentanyl  patch chronically.  Dose increased on 10/15 from 25-37.5. Also on as needed IV Dilaudid . He recently underwent celiac plexus block without any significant improvement.   Severe hypokalemia, severe hypocalcemia, hypophosphatemia. Hypomagnesemia. Currently being treated aggressively. Appreciate oncology consultation. It appears that the patient received denosumab  in the past which could be related to her hypercalcemia. Per nephrology continue IV replacement until patient is able to maintain electrolytes orally or via tube.   Goals of care conversation. Appreciate  Palliative care  consultation. Currently full code.  Wants full code scope of treatment. Prognosis is poor though.  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 Left lower quadrant enterocutaneous fistula. S/p laparoscopic gastrojejunostomy bypass 01/02/24 by Dr. Lyndel Subsequently underwent G tube and J tube placement 01/10/24 Readmitted 8/14 for FTT and G and J tube malposition. G tube replaced by IR 8/15. J tube ultimately removed due to obstruction.  Eakins pouch in place, low output. Wound care consulted due to leaking.  Currently no evidence of active infection. not a candidate for surgical management of possible primary pancreatic malignancy in the setting of her metastatic breast cancer with carcinomatosis.  Has a venting G-tube.  Which is currently being used for medications.  Vitamin D  deficiency. Replacing with D2.  Bleeding from the enterocutaneous fistula. Patient has a jejunal fistula which led to some bleeding on the morning of 10/17. Discussed with general surgery, recommend close monitoring. Lovenox  held.  No further bleeding.  Lovenox  resumed on 10/18.   Subjective: No nausea no vomiting.  Tolerating oral diet.  Passing gas.  No BM.  Physical Exam: Clear to auscultation. S1-S2 present Bowel sound present.  Diffusely tender. No edema.  Data Reviewed: I have Reviewed nursing notes, Vitals, and Lab results. Since last encounter, pertinent lab results CBC and BMP   . I have ordered test including CBC and BMP  .   Disposition: Status is: Inpatient Remains inpatient appropriate because: Still receiving IV sodium phosphate  as well as calcium  gluconate  enoxaparin  (LOVENOX ) injection 30 mg Start: 04/07/24 1200   Family Communication: No one at bedside discussed with family on 10/17. Level of care: Telemetry   Vitals:   04/06/24 2019 04/07/24 0216  04/07/24 0504 04/07/24 1240  BP: 107/67 112/63 (!) 96/58 97/64  Pulse: 71 64 (!) 55 64  Resp: 16  18 16   Temp: 98.3 F (36.8 C)  98.1 F (36.7 C) 98.3 F (36.8 C)  TempSrc: Oral  Oral Oral  SpO2: 97% 99% 99% 98%  Weight:   75.1 kg   Height:         Author: Yetta Blanch, MD 04/07/2024 5:56 PM  Please look on [email or web address] to find out who is on call.

## 2024-04-08 DIAGNOSIS — E878 Other disorders of electrolyte and fluid balance, not elsewhere classified: Secondary | ICD-10-CM | POA: Diagnosis not present

## 2024-04-08 LAB — CBC
HCT: 27.6 % — ABNORMAL LOW (ref 36.0–46.0)
Hemoglobin: 8.9 g/dL — ABNORMAL LOW (ref 12.0–15.0)
MCH: 26.6 pg (ref 26.0–34.0)
MCHC: 32.2 g/dL (ref 30.0–36.0)
MCV: 82.4 fL (ref 80.0–100.0)
Platelets: 169 K/uL (ref 150–400)
RBC: 3.35 MIL/uL — ABNORMAL LOW (ref 3.87–5.11)
RDW: 17 % — ABNORMAL HIGH (ref 11.5–15.5)
WBC: 6.4 K/uL (ref 4.0–10.5)
nRBC: 0.9 % — ABNORMAL HIGH (ref 0.0–0.2)

## 2024-04-08 LAB — RENAL FUNCTION PANEL
Albumin: 3.4 g/dL — ABNORMAL LOW (ref 3.5–5.0)
Anion gap: 11 (ref 5–15)
BUN: 5 mg/dL — ABNORMAL LOW (ref 6–20)
CO2: 23 mmol/L (ref 22–32)
Calcium: 7 mg/dL — ABNORMAL LOW (ref 8.9–10.3)
Chloride: 102 mmol/L (ref 98–111)
Creatinine, Ser: 0.35 mg/dL — ABNORMAL LOW (ref 0.44–1.00)
GFR, Estimated: >60 mL/min (ref >60)
Glucose, Bld: 107 mg/dL — ABNORMAL HIGH (ref 70–99)
Phosphorus: 1.7 mg/dL — ABNORMAL LOW (ref 2.5–4.6)
Potassium: 3.7 mmol/L (ref 3.5–5.1)
Sodium: 136 mmol/L (ref 135–145)

## 2024-04-08 LAB — MAGNESIUM: Magnesium: 1.6 mg/dL — ABNORMAL LOW (ref 1.7–2.4)

## 2024-04-08 MED ORDER — CALCIUM GLUCONATE-NACL 1-0.675 GM/50ML-% IV SOLN
1.0000 g | Freq: Three times a day (TID) | INTRAVENOUS | Status: DC
  Administered 2024-04-08 – 2024-04-16 (×23): 1000 mg via INTRAVENOUS
  Filled 2024-04-08 (×25): qty 50

## 2024-04-08 MED ORDER — MAGNESIUM SULFATE 4 GM/100ML IV SOLN
4.0000 g | Freq: Once | INTRAVENOUS | Status: AC
  Administered 2024-04-08: 4 g via INTRAVENOUS
  Filled 2024-04-08: qty 100

## 2024-04-08 MED ORDER — MAGNESIUM SULFATE 2 GM/50ML IV SOLN
2.0000 g | Freq: Three times a day (TID) | INTRAVENOUS | Status: AC
  Administered 2024-04-08 (×2): 2 g via INTRAVENOUS
  Filled 2024-04-08 (×2): qty 50

## 2024-04-08 MED ORDER — FENTANYL 50 MCG/HR TD PT72
1.0000 | MEDICATED_PATCH | TRANSDERMAL | Status: DC
  Administered 2024-04-08 – 2024-04-14 (×3): 1 via TRANSDERMAL
  Filled 2024-04-08 (×3): qty 1

## 2024-04-08 MED ORDER — SODIUM PHOSPHATES 45 MMOLE/15ML IV SOLN
30.0000 mmol | Freq: Once | INTRAVENOUS | Status: AC
  Administered 2024-04-08: 30 mmol via INTRAVENOUS
  Filled 2024-04-08: qty 10

## 2024-04-08 MED ORDER — ENOXAPARIN SODIUM 40 MG/0.4ML IJ SOSY
40.0000 mg | PREFILLED_SYRINGE | Freq: Every day | INTRAMUSCULAR | Status: DC
Start: 2024-04-09 — End: 2024-04-11
  Administered 2024-04-09 – 2024-04-10 (×2): 40 mg via SUBCUTANEOUS
  Filled 2024-04-08 (×2): qty 0.4

## 2024-04-08 MED ORDER — CALCIUM CARBONATE ANTACID 1250 MG/5ML PO SUSP
2500.0000 mg | Freq: Three times a day (TID) | ORAL | Status: DC
  Administered 2024-04-08 – 2024-04-12 (×11): 2500 mg
  Filled 2024-04-08 (×15): qty 25

## 2024-04-08 MED ORDER — MAGNESIUM SULFATE 2 GM/50ML IV SOLN: 2.0000 g | Freq: Three times a day (TID) | INTRAVENOUS | Status: DC

## 2024-04-08 NOTE — Plan of Care (Signed)
  Problem: Clinical Measurements: Goal: Will remain free from infection Outcome: Progressing   Problem: Activity: Goal: Risk for activity intolerance will decrease Outcome: Progressing   Problem: Nutrition: Goal: Adequate nutrition will be maintained Outcome: Progressing   Problem: Pain Managment: Goal: General experience of comfort will improve and/or be controlled Outcome: Progressing

## 2024-04-08 NOTE — Progress Notes (Signed)
 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  Recently admitted 9/29-10/8 with similar issues and going home.  This is her third admission in 1 month. In the emergency room hemodynamically stable.  Hemoglobin is stable.  Potassium 3.2, calcium  5.4.  Phosphorus was low.  CT scan with peritoneal carcinomatosis, improved small fluid collection in the left abdominal wall.  Admitted for symptomatic treatment. Remains in the hospital.  She is on around-the-clock IV Dilaudid  for pain control, on fentanyl .  Also challenging to keep up her electrolytes.  Oncology, palliative, nephrology following.  Subjective:  Patient seen and examined.  Discussed with nephrology. Patient tells me that she needs pain medication now.  Patient tells me that she is eating most of the meal and shows me the empty plate.  Pain persist and somehow the crushed medicine through the G-tube is not helping but she needs injection Dilaudid . Patient was wondering how long she is going to stay in the hospital.  Explained to her that she will stay in the hospital until she can maintain blood levels with oral intake.   Assessment & Plan:   Intractable nausea and vomiting.  Recurrent problem.  Due to carcinomatosis. CT abdomen without any evidence of obstruction. GI consulted. Palliative care also consulted. Decadron  recommended by palliative care for refractory nausea and vomiting. On  Phenergan  and Decadron . Zyprexa  . GI also consulted for now no new recommendation. Continue PPI.  Continue supportive care.   Moderate protein calorie malnutrition. Secondary poor p.o. intake due to ongoing GI symptoms. Significant concern for patient's prognosis. Patient unable to tolerate tube feeding via tubes in the past. 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 Patient does not use G-tube for nutrition. Will monitor for improvement in symptoms. Appreciate palliative care consultation.   Acute on chronic abdominal pain related to cancer. Patient is on fentanyl  patch chronically.  Dose increased on 10/15 from 25-37.5. Also on as needed IV Dilaudid . She recently underwent celiac plexus block without any significant improvement.   Severe hypokalemia, severe hypocalcemia, hypophosphatemia. Hypomagnesemia. Currently being treated aggressively.  Patient received denosumab  in the past. Appreciate oncology consultation.  Nephrology also following. It appears that the patient received denosumab  in the past which could be related to her hypercalcemia. Per nephrology continue IV replacement until patient is able to maintain electrolytes orally or via tube. Patient remains on scheduled IV calcium , oral calcitriol  and calcium  carbonate.  Calcium  is 7 today. Magnesium  1.6, additional 4 gm today.  Phos 1.7, additional 30 units sodium phosphate  today. Patient remains on bicarbonate infusion.  Appreciate nephrology input.   Goals of care conversation. Appreciate Palliative care  consultation. Currently full code.  Wants full code scope of treatment. Prognosis is poor though.   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 Left lower quadrant enterocutaneous fistula. S/p laparoscopic gastrojejunostomy bypass 01/02/24 by Dr.  Lyndel Subsequently underwent G tube and J tube placement 01/10/24 Readmitted 8/14 for FTT and G and  J tube malposition. G tube replaced by IR 8/15. J tube ultimately removed due to obstruction.  Eakins pouch in place, low output. Wound care consulted due to leaking.  Currently no evidence of active infection. not a candidate for surgical management of possible primary pancreatic malignancy in the setting of her metastatic breast cancer with carcinomatosis.  Has a venting G-tube.  Which is currently being used for medications.   Vitamin D  deficiency. Replacing with D2.   Bleeding from the enterocutaneous fistula. Patient has a jejunal fistula which led to some bleeding on the morning of 10/17. Discussed with general surgery, recommend close monitoring. Lovenox  held.  No further bleeding.  Lovenox  resumed on 10/18.  Tolerating well.  DVT prophylaxis: enoxaparin  (LOVENOX ) injection 30 mg Start: 04/07/24 1200Subcu Lovenox .   Code Status: Full code Family Communication:  Disposition Plan: Status is: Inpatient.  Persistent severe electrolyte abnormalities.     Consultants:  Oncology Palliative Gastroenterology Nephrology  Procedures:  None  Antimicrobials:  None     Objective: Vitals:   04/07/24 0504 04/07/24 1240 04/07/24 2002 04/08/24 0514  BP: (!) 96/58 97/64 121/79 114/65  Pulse: (!) 55 64 70 68  Resp: 18 16 18 18   Temp: 98.1 F (36.7 C) 98.3 F (36.8 C) 98.3 F (36.8 C) 98.2 F (36.8 C)  TempSrc: Oral Oral Oral Oral  SpO2: 99% 98% 100% 99%  Weight: 75.1 kg   74.5 kg  Height:        Intake/Output Summary (Last 24 hours) at 04/08/2024 0735 Last data filed at 04/07/2024 2057 Gross per 24 hour  Intake 560 ml  Output 150 ml  Net 410 ml   Filed Weights   04/06/24 0505 04/07/24 0504 04/08/24 0514  Weight: 76 kg 75.1 kg 74.5 kg    Examination:  General: Chronically sick looking.  Not in any distress.  Interactive.  Flat affect. Cardiovascular:  S1-S2 normal.  Regular rate rhythm. Respiratory: Bilateral clear.  No added sounds. Gastrointestinal: Soft.  Mildly tender along the G-tube.  G-tube intact, some surrounding minimal drainage. Left lateral quadrant Eakin's pouch with some clear drainage.  No bleeding. Ext: No swelling edema. Neuro: Alert awake and oriented.  No focal logical deficits.     Data Reviewed: I have personally reviewed following labs and imaging studies  CBC: Recent Labs  Lab 04/01/24 1825 04/03/24 0540 04/05/24 0014 04/06/24 0434 04/07/24 0348 04/07/24 1745 04/08/24 0248  WBC 3.4* 3.2* 3.9* 4.3 5.0 5.8 6.4  NEUTROABS 1.7 1.8  --   --   --   --   --   HGB 9.3* 8.7* 9.5* 8.7* 8.4* 8.5* 8.9*  HCT 28.9* 27.1* 29.6* 27.9* 26.4* 26.8* 27.6*  MCV 81.0 81.9 82.2 82.3 83.8 83.5 82.4  PLT 159 152 180 179 161 165 169   Basic Metabolic Panel: Recent Labs  Lab 04/04/24 0255 04/04/24 1808 04/05/24 0014 04/05/24 1748 04/06/24 0434 04/07/24 0348 04/08/24 0240 04/08/24 0248  NA 137   < > 134* 135 137 136 136  --   K 4.1   < > 4.7 4.7 3.9 3.9 3.7  --   CL 106   < > 103 102 105 105 102  --   CO2 22   < > 23 23 24 22 23   --   GLUCOSE 78   < > 160* 130* 93 136* 107*  --   BUN <5*   < > <5* <5* <5* 5* 5*  --   CREATININE <0.30*   < > <0.30*  0.34* <0.30* 0.37* 0.35*  --   CALCIUM  5.3*   < > 6.5* 6.7* 6.0* 6.8*  6.9* 7.0*  --   MG 2.0  --  2.5*  --  2.0 1.8  --  1.6*  PHOS 2.2*  --  1.9*  --  1.8* 1.8* 1.7*  --    < > = values in this interval not displayed.   GFR: Estimated Creatinine Clearance: 93 mL/min (A) (by C-G formula based on SCr of 0.35 mg/dL (L)). Liver Function Tests: Recent Labs  Lab 04/01/24 1825 04/04/24 0255 04/05/24 0014 04/06/24 0434 04/07/24 0348 04/08/24 0240  AST 22  --   --   --   --   --   ALT 23  --   --   --   --   --   ALKPHOS 809*  --   --   --   --   --   BILITOT 0.4  --   --   --   --   --   PROT 5.7*  --   --   --   --   --   ALBUMIN  3.3* 2.9* 3.3* 3.3* 3.2* 3.4*    Recent Labs  Lab 04/01/24 1825  LIPASE 24   No results for input(s): AMMONIA in the last 168 hours. Coagulation Profile: No results for input(s): INR, PROTIME in the last 168 hours. Cardiac Enzymes: No results for input(s): CKTOTAL, CKMB, CKMBINDEX, TROPONINI in the last 168 hours. BNP (last 3 results) No results for input(s): PROBNP in the last 8760 hours. HbA1C: No results for input(s): HGBA1C in the last 72 hours. CBG: No results for input(s): GLUCAP in the last 168 hours. Lipid Profile: No results for input(s): CHOL, HDL, LDLCALC, TRIG, CHOLHDL, LDLDIRECT in the last 72 hours. Thyroid  Function Tests: No results for input(s): TSH, T4TOTAL, FREET4, T3FREE, THYROIDAB in the last 72 hours. Anemia Panel: No results for input(s): VITAMINB12, FOLATE, FERRITIN, TIBC, IRON, RETICCTPCT in the last 72 hours. Sepsis Labs: No results for input(s): PROCALCITON, LATICACIDVEN in the last 168 hours.  Recent Results (from the past 240 hours)  C Difficile Quick Screen w PCR reflex     Status: None   Collection Time: 04/01/24 10:21 PM   Specimen: STOOL  Result Value Ref Range Status   C Diff antigen NEGATIVE NEGATIVE Final   C Diff toxin NEGATIVE NEGATIVE Final   C Diff interpretation No C. difficile detected.  Final    Comment: Performed at [facility], [address] [place], [place] [address]  Gastrointestinal Panel by PCR , Stool     Status: None   Collection Time: 04/01/24 10:21 PM   Specimen: STOOL  Result Value Ref Range Status   Campylobacter species NOT DETECTED NOT DETECTED Final   Plesimonas shigelloides NOT DETECTED NOT DETECTED Final   Salmonella species NOT DETECTED NOT DETECTED Final   Yersinia enterocolitica NOT DETECTED NOT DETECTED Final   Vibrio species NOT DETECTED NOT DETECTED Final   Vibrio cholerae NOT DETECTED NOT DETECTED Final   Enteroaggregative E coli (EAEC) NOT DETECTED NOT  DETECTED Final   Enteropathogenic E coli (EPEC) NOT DETECTED NOT DETECTED Final   Enterotoxigenic E coli (ETEC) NOT DETECTED NOT DETECTED Final   Shiga like toxin producing E coli (STEC) NOT DETECTED NOT DETECTED Final   Shigella/Enteroinvasive E coli (EIEC) NOT DETECTED NOT DETECTED Final   Cryptosporidium NOT DETECTED NOT DETECTED Final   Cyclospora cayetanensis NOT DETECTED NOT DETECTED Final  Entamoeba histolytica NOT DETECTED NOT DETECTED Final   Giardia lamblia NOT DETECTED NOT DETECTED Final   Adenovirus F40/41 NOT DETECTED NOT DETECTED Final   Astrovirus NOT DETECTED NOT DETECTED Final   Norovirus GI/GII NOT DETECTED NOT DETECTED Final   Rotavirus A NOT DETECTED NOT DETECTED Final   Sapovirus (I, II, IV, and V) NOT DETECTED NOT DETECTED Final    Comment: Performed at [facility], [address], [place], [place] [address]         Radiology Studies: No results found.       Scheduled Meds:  calcitRIOL   0.25 mcg Per Tube BID   calcium  carbonate (dosed in mg elemental calcium )  2,000 mg of elemental calcium  Per Tube TID BM   Chlorhexidine  Gluconate Cloth  6 each Topical Daily   dexamethasone  (DECADRON ) injection  4 mg Intravenous Q12H   docusate  200 mg Per Tube Daily   enoxaparin  (LOVENOX ) injection  30 mg Subcutaneous Daily   ergocalciferol  (VITAMIN D2)  8,000 Units Per Tube Daily   feeding supplement  1 Container Oral TID WC   feeding supplement (KATE FARMS STANDARD 1.4)  325 mL Oral BID BM   fentaNYL   1 patch Transdermal Q72H   fentaNYL   1 patch Transdermal Q72H   magnesium  oxide  400 mg Oral BID   multivitamin with minerals  1 tablet Per Tube Daily   OLANZapine  zydis  5 mg Oral QHS   pantoprazole  (PROTONIX ) IV  40 mg Intravenous Q24H   sodium chloride  flush  10-40 mL Intracatheter Q12H   sorbitol   30 mL Per Tube Q0600   Continuous Infusions:  calcium  gluconate 2,000 mg (04/08/24 0512)   magnesium  sulfate bolus IVPB     sodium bicarbonate 150 mEq  in sterile water  1,150 mL infusion 75 mL/hr at 04/07/24 1846   sodium PHOSPHATE  IVPB (in mmol)       LOS: 6 days    Time spent: 55 minutes    Renato Applebaum, MD [facility]

## 2024-04-08 NOTE — Progress Notes (Signed)
 PMT progress note  Patient seen sitting up in bed, no distress, symptoms a re reasonably well controlled, she has been able to continue to eat about 90% of her meals in the past 24 hours. She denies any new complaints today.   Chart reviewed Medication history noted Oncology TRH and TOC notes reviewed BP 114/65 (BP Location: Right Arm)   Pulse 68   Temp 98.2 F (36.8 C) (Oral)   Resp 18   Ht 5' 11 (1.803 m)   Wt 74.5 kg   SpO2 99%   BMI 22.91 kg/m  Resting comfortably No distress currently Tolerating PO Nausea/vomiting improving Ca levels improving, being monitored closely. 7 on 04-08-24. Renal services also following along, patient on IVF Bicarb, patient on Mag replacement, patient on Ca gluconate IV, PO Vit D. Shared with patient about her current Ca level as per her request.  As per oncology documentation - patient remains invested in continuing cancer directed therapies, remains full code, full scope care for now. Patient already a patient of my colleague Ms Cousar NP Palliative at the cancer center.  From palliative standpoint - continue current pain and anti emetic regimen for now 3 mg IV Dilaudid  used in the past 24 hours. Also has Oxy IR PO PRN available, but primarily using IV Dilaudid  for breakthrough pain as of now, will re discuss with patient regarding trying PO PRN opioids for acute/breakthrough pain.  Will increase TDF from 37 mcg Q 72 hours to 50 mcg Q 72 hours and monitor.  Remains on IV Dexamethasone , once symptoms more better controlled - will switch to decadron  taper.  Remains on Zyprexa  low dose Q HS for now for anti emetic assistance.   PMT to follow    Mod MDM Lonia Serve MD [facility] palliative.

## 2024-04-08 NOTE — Progress Notes (Signed)
 [facility] Progress Note  Subjective:  No new c/o's Ca++ up to 7.0 today Mg down to 1.6 Pt feeling okay, no issues  Vitals:   04/07/24 0504 04/07/24 1240 04/07/24 2002 04/08/24 0514  BP: (!) 96/58 97/64 121/79 114/65  Pulse: (!) 55 64 70 68  Resp: 18 16 18 18   Temp: 98.1 F (36.7 C) 98.3 F (36.8 C) 98.3 F (36.8 C) 98.2 F (36.8 C)  TempSrc: Oral Oral Oral Oral  SpO2: 99% 98% 100% 99%  Weight: 75.1 kg   74.5 kg  Height:        Exam: Gen alert, no distress Sclera anicteric, throat clear  No jvd or bruits Chest clear bilat to bases RRR no MRG Abd soft ntnd no mass or ascites +bs Ext no LE or UE edema, no other edema Neuro is alert, Ox 3 , nf     Vit D, 25 hydroxy = 7.9 (30-100) Vit 1, 25 dihydroxy = 94 (24- 81) PTH = 207 (15- 65) Today's lab --> Mg ^ 2.5    Ca ^6.5 (CCa = 7.0)       Assessment/ Plan: 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 Has been getting denosumab  every 3 mos since dec 2023, 8 doses in total, the last dose was 03/14/24. Her vit D3 is very low, just came back. 1,25 vit D is high and PTH is slightly high at 207. Denosumab  therapy, ongoing for last 22 months, is the likely cause of her hypocalcemia. Low vit D 25 is contributing. Renal function is normal. Denosumab  inhibits osteoclast function, so the process of using bone breakdown to keep blood Ca++ levels up is blocked. Bone building is still happening, so the 2 are out of balance = Ca++ going into bones but not coming out. When the drug eventually is fully metabolized (about 1-3 months from last dose per pharmacy), presumably her hypocalcemia issues should be fixed. Needs to avoid future anti-resorptive agents. Until then needs aggressive IV replacement, then continued high-level CaCO3 and activated vit D upon discharge. Also need to maintain Mg levels. Today Ca++ continues to ^ up to 7.0.  - goal Ca 7.5- 9 when not getting IV Ca and  getting per tube/po CaCO3 only  - when dc'd in OP setting will need frequent (weekly) Ca labs - when the last denosumab  shot (03/14/24) is gone in the body, Ca++ levels should rise and the supplemental CaCO3 will need to be tapered down/ off - today will lower IV Ca gluconate to 1 gm q 8 hrs, and ^ liquid CaCO3 via tube to 2.5 gm elemental tid (7.5 gm per day elemental) - will cont activated vit D (1, 25 hydroxy) 0.25 mcg bid and high dose 25 vit D  Hypomagnesemia: Mg was low at 1.6, then corrected on magnesium  sulfate IV 2gm tid to 2.5. IV Mg was dc'd and po Mg oxide resume.  Mg drops down againh to 1.6 today - cont Mg oxide 400mg  bid per tube - re-bolus magnesium  sulfate 2 gm q 8 x 3   Hypophosphatemia: appreciate pharm/ pmd assist w/ replenishing Hypokalemia: appreciate pharm/ pmd assist w/ replenishing Volume: cont IVF (bicarb) at 75 cc/hr.  Metabolic acidosis: cont bicarb drip at 75 cc/hr Breast cancer: w/ peritoneal and bony mets       Myer Fret MD  [facility] 04/08/2024, 10:13 AM  Recent Labs  Lab 04/07/24 0348 04/07/24 1745 04/08/24 0240 04/08/24 0248  HGB 8.4* 8.5*  --  8.9*  ALBUMIN  3.2*  --  3.4*  --   CALCIUM  6.8*  6.9*  --  7.0*  --   PHOS 1.8*  --  1.7*  --   CREATININE 0.37*  --  0.35*  --   K 3.9  --  3.7  --    No results for input(s): IRON, TIBC, FERRITIN in the last 168 hours. Inpatient medications:  calcitRIOL   0.25 mcg Per Tube BID   calcium  carbonate (dosed in mg elemental calcium )  2,000 mg of elemental calcium  Per Tube TID BM   Chlorhexidine  Gluconate Cloth  6 each Topical Daily   dexamethasone  (DECADRON ) injection  4 mg Intravenous Q12H   docusate  200 mg Per Tube Daily   enoxaparin  (LOVENOX ) injection  30 mg Subcutaneous Daily   ergocalciferol  (VITAMIN D2)  8,000 Units Per Tube Daily   feeding supplement  1 Container Oral TID WC   feeding supplement (KATE FARMS STANDARD 1.4)  325 mL Oral BID BM   fentaNYL   1 patch Transdermal Q72H   magnesium   oxide  400 mg Oral BID   multivitamin with minerals  1 tablet Per Tube Daily   OLANZapine  zydis  5 mg Oral QHS   pantoprazole  (PROTONIX ) IV  40 mg Intravenous Q24H   sodium chloride  flush  10-40 mL Intracatheter Q12H   sorbitol   30 mL Per Tube Q0600    calcium  gluconate 2,000 mg (04/08/24 0512)   magnesium  sulfate bolus IVPB     sodium bicarbonate 150 mEq in sterile water  1,150 mL infusion 75 mL/hr at 04/07/24 1846   sodium PHOSPHATE  IVPB (in mmol) 30 mmol (04/08/24 0912)   HYDROmorphone  (DILAUDID ) injection, naLOXone  (NARCAN )  injection, oxyCODONE , prochlorperazine , sodium chloride  flush

## 2024-04-09 ENCOUNTER — Other Ambulatory Visit (HOSPITAL_COMMUNITY): Payer: Self-pay

## 2024-04-09 ENCOUNTER — Encounter (HOSPITAL_COMMUNITY): Payer: Self-pay | Admitting: Internal Medicine

## 2024-04-09 ENCOUNTER — Inpatient Hospital Stay (HOSPITAL_COMMUNITY): Payer: MEDICAID

## 2024-04-09 ENCOUNTER — Encounter: Payer: Self-pay | Admitting: General Practice

## 2024-04-09 DIAGNOSIS — R000 Tachycardia, unspecified: Secondary | ICD-10-CM

## 2024-04-09 DIAGNOSIS — E878 Other disorders of electrolyte and fluid balance, not elsewhere classified: Secondary | ICD-10-CM | POA: Diagnosis not present

## 2024-04-09 DIAGNOSIS — I10 Essential (primary) hypertension: Secondary | ICD-10-CM | POA: Diagnosis not present

## 2024-04-09 DIAGNOSIS — A419 Sepsis, unspecified organism: Secondary | ICD-10-CM

## 2024-04-09 DIAGNOSIS — R579 Shock, unspecified: Secondary | ICD-10-CM

## 2024-04-09 DIAGNOSIS — E785 Hyperlipidemia, unspecified: Secondary | ICD-10-CM

## 2024-04-09 DIAGNOSIS — E876 Hypokalemia: Secondary | ICD-10-CM

## 2024-04-09 LAB — CBC
HCT: 25.6 % — ABNORMAL LOW (ref 36.0–46.0)
Hemoglobin: 8.5 g/dL — ABNORMAL LOW (ref 12.0–15.0)
MCH: 27.3 pg (ref 26.0–34.0)
MCHC: 33.2 g/dL (ref 30.0–36.0)
MCV: 82.3 fL (ref 80.0–100.0)
Platelets: 122 K/uL — ABNORMAL LOW (ref 150–400)
RBC: 3.11 MIL/uL — ABNORMAL LOW (ref 3.87–5.11)
RDW: 17 % — ABNORMAL HIGH (ref 11.5–15.5)
WBC: 4.9 K/uL (ref 4.0–10.5)
nRBC: 2.3 % — ABNORMAL HIGH (ref 0.0–0.2)

## 2024-04-09 LAB — RENAL FUNCTION PANEL
Albumin: 3.3 g/dL — ABNORMAL LOW (ref 3.5–5.0)
Anion gap: 10 (ref 5–15)
BUN: 8 mg/dL (ref 6–20)
CO2: 24 mmol/L (ref 22–32)
Calcium: 6.8 mg/dL — ABNORMAL LOW (ref 8.9–10.3)
Chloride: 98 mmol/L (ref 98–111)
Creatinine, Ser: 0.37 mg/dL — ABNORMAL LOW (ref 0.44–1.00)
GFR, Estimated: >60 mL/min (ref >60)
Glucose, Bld: 118 mg/dL — ABNORMAL HIGH (ref 70–99)
Phosphorus: 1.7 mg/dL — ABNORMAL LOW (ref 2.5–4.6)
Potassium: 3.6 mmol/L (ref 3.5–5.1)
Sodium: 132 mmol/L — ABNORMAL LOW (ref 135–145)

## 2024-04-09 LAB — LACTIC ACID, PLASMA
Lactic Acid, Venous: 3.1 mmol/L (ref 0.5–1.9)
Lactic Acid, Venous: 1 mmol/L (ref 0.5–1.9)

## 2024-04-09 LAB — PROCALCITONIN: Procalcitonin: 9.11 ng/mL

## 2024-04-09 LAB — MAGNESIUM: Magnesium: 2.6 mg/dL — ABNORMAL HIGH (ref 1.7–2.4)

## 2024-04-09 MED ORDER — SODIUM CHLORIDE (PF) 0.9 % IJ SOLN
INTRAMUSCULAR | Status: AC
  Filled 2024-04-09: qty 50

## 2024-04-09 MED ORDER — NOREPINEPHRINE 4 MG/250ML-% IV SOLN
0.0000 ug/min | INTRAVENOUS | Status: DC
  Administered 2024-04-09: 12 ug/min via INTRAVENOUS
  Administered 2024-04-09: 2 ug/min via INTRAVENOUS
  Administered 2024-04-09: 25 ug/min via INTRAVENOUS
  Administered 2024-04-10: 3 ug/min via INTRAVENOUS
  Administered 2024-04-10: 5 ug/min via INTRAVENOUS
  Filled 2024-04-09 (×4): qty 250

## 2024-04-09 MED ORDER — ADENOSINE 6 MG/2ML IV SOLN
12.0000 mg | Freq: Once | INTRAVENOUS | Status: AC
  Administered 2024-04-09: 12 mg via INTRAVENOUS

## 2024-04-09 MED ORDER — IOHEXOL 9 MG/ML PO SOLN
500.0000 mL | ORAL | Status: AC
  Administered 2024-04-09 (×2): 500 mL via ORAL

## 2024-04-09 MED ORDER — MAGNESIUM OXIDE -MG SUPPLEMENT 400 (240 MG) MG PO TABS
400.0000 mg | ORAL_TABLET | Freq: Two times a day (BID) | ORAL | Status: DC
  Administered 2024-04-10 – 2024-04-12 (×4): 400 mg
  Filled 2024-04-09 (×4): qty 1

## 2024-04-09 MED ORDER — K PHOS MONO-SOD PHOS DI & MONO 155-852-130 MG PO TABS
250.0000 mg | ORAL_TABLET | Freq: Three times a day (TID) | ORAL | Status: DC
Start: 2024-04-09 — End: 2024-04-12
  Administered 2024-04-09 – 2024-04-12 (×7): 250 mg
  Filled 2024-04-09 (×6): qty 1

## 2024-04-09 MED ORDER — OXYCODONE HCL 5 MG/5ML PO SOLN: 10.0000 mg | ORAL | Status: DC | PRN

## 2024-04-09 MED ORDER — VANCOMYCIN HCL IN DEXTROSE 1-5 GM/200ML-% IV SOLN: 1000.0000 mg | Freq: Two times a day (BID) | INTRAVENOUS | Status: DC

## 2024-04-09 MED ORDER — K PHOS MONO-SOD PHOS DI & MONO 155-852-130 MG PO TABS
250.0000 mg | ORAL_TABLET | Freq: Three times a day (TID) | ORAL | Status: DC
  Administered 2024-04-09: 250 mg via ORAL
  Filled 2024-04-09: qty 1

## 2024-04-09 MED ORDER — AMIODARONE HCL IN DEXTROSE 360-4.14 MG/200ML-% IV SOLN
INTRAVENOUS | Status: AC
  Filled 2024-04-09: qty 200

## 2024-04-09 MED ORDER — LACTATED RINGERS IV BOLUS
500.0000 mL | Freq: Once | INTRAVENOUS | Status: AC
  Administered 2024-04-09: 500 mL via INTRAVENOUS

## 2024-04-09 MED ORDER — SODIUM CHLORIDE 0.9 % IV BOLUS
500.0000 mL | Freq: Once | INTRAVENOUS | Status: AC
  Administered 2024-04-09: 500 mL via INTRAVENOUS

## 2024-04-09 MED ORDER — ADENOSINE 6 MG/2ML IV SOLN: 6.0000 mg | Freq: Once | INTRAVENOUS | Status: AC

## 2024-04-09 MED ORDER — VANCOMYCIN HCL 1500 MG/300ML IV SOLN
1500.0000 mg | Freq: Once | INTRAVENOUS | Status: AC
  Administered 2024-04-09: 1500 mg via INTRAVENOUS
  Filled 2024-04-09: qty 300

## 2024-04-09 MED ORDER — HYDROMORPHONE HCL 1 MG/ML IJ SOLN
0.5000 mg | INTRAMUSCULAR | Status: DC | PRN
  Administered 2024-04-10 – 2024-04-11 (×3): 0.5 mg via INTRAVENOUS
  Filled 2024-04-09 (×4): qty 1

## 2024-04-09 MED ORDER — IOHEXOL 300 MG/ML  SOLN
100.0000 mL | Freq: Once | INTRAMUSCULAR | Status: AC | PRN
  Administered 2024-04-09: 100 mL via INTRAVENOUS

## 2024-04-09 MED ORDER — LACTATED RINGERS IV SOLN: INTRAVENOUS | Status: DC

## 2024-04-09 MED ORDER — ACETAMINOPHEN 325 MG PO TABS
650.0000 mg | ORAL_TABLET | Freq: Once | ORAL | Status: AC
  Administered 2024-04-09: 650 mg via ORAL
  Filled 2024-04-09: qty 2

## 2024-04-09 MED ORDER — NOREPINEPHRINE 4 MG/250ML-% IV SOLN
INTRAVENOUS | Status: AC
  Administered 2024-04-09: 2 ug/min via INTRAVENOUS
  Filled 2024-04-09: qty 250

## 2024-04-09 MED ORDER — HYDROCORTISONE SOD SUC (PF) 100 MG IJ SOLR
100.0000 mg | Freq: Three times a day (TID) | INTRAMUSCULAR | Status: DC
  Administered 2024-04-09 – 2024-04-19 (×30): 100 mg via INTRAVENOUS
  Filled 2024-04-09 (×30): qty 2

## 2024-04-09 MED ORDER — AMIODARONE HCL IN DEXTROSE 360-4.14 MG/200ML-% IV SOLN
30.0000 mg/h | INTRAVENOUS | Status: DC
  Administered 2024-04-09 – 2024-04-11 (×4): 30 mg/h via INTRAVENOUS
  Filled 2024-04-09 (×4): qty 200

## 2024-04-09 MED ORDER — HYDROMORPHONE HCL 1 MG/ML IJ SOLN
1.0000 mg | INTRAMUSCULAR | Status: DC | PRN
  Administered 2024-04-09: 1 mg via INTRAVENOUS
  Filled 2024-04-09: qty 1

## 2024-04-09 MED ORDER — PIPERACILLIN-TAZOBACTAM 3.375 G IVPB
3.3750 g | Freq: Three times a day (TID) | INTRAVENOUS | Status: DC
  Administered 2024-04-09 – 2024-04-10 (×3): 3.375 g via INTRAVENOUS
  Filled 2024-04-09 (×3): qty 50

## 2024-04-09 MED ORDER — AMIODARONE HCL IN DEXTROSE 360-4.14 MG/200ML-% IV SOLN
60.0000 mg/h | INTRAVENOUS | Status: AC
  Administered 2024-04-09: 60 mg/h via INTRAVENOUS

## 2024-04-09 MED ORDER — PIPERACILLIN-TAZOBACTAM 3.375 G IVPB
3.3750 g | Freq: Once | INTRAVENOUS | Status: AC
  Administered 2024-04-09: 3.375 g via INTRAVENOUS
  Filled 2024-04-09: qty 50

## 2024-04-09 MED ORDER — SODIUM CHLORIDE 0.9 % IV BOLUS
2000.0000 mL | Freq: Once | INTRAVENOUS | Status: AC
Start: 2024-04-09 — End: 2024-04-09
  Administered 2024-04-09: 2000 mL via INTRAVENOUS

## 2024-04-09 MED ORDER — VASOPRESSIN 20 UNITS/100 ML INFUSION FOR SHOCK
0.0000 [IU]/min | INTRAVENOUS | Status: DC
  Administered 2024-04-09 – 2024-04-11 (×4): 0.03 [IU]/min via INTRAVENOUS
  Filled 2024-04-09 (×4): qty 100

## 2024-04-09 MED ORDER — SODIUM CHLORIDE 0.9 % IV SOLN
45.0000 mmol | Freq: Once | INTRAVENOUS | Status: AC
  Administered 2024-04-09: 45 mmol via INTRAVENOUS
  Filled 2024-04-09: qty 15

## 2024-04-09 MED ORDER — AMIODARONE IV BOLUS ONLY 150 MG/100ML
INTRAVENOUS | Status: AC
  Filled 2024-04-09: qty 100

## 2024-04-09 MED ORDER — AMIODARONE LOAD VIA INFUSION
150.0000 mg | Freq: Once | INTRAVENOUS | Status: AC
  Administered 2024-04-09: 150 mg via INTRAVENOUS

## 2024-04-09 MED ORDER — K PHOS MONO-SOD PHOS DI & MONO 155-852-130 MG PO TABS: 250.0000 mg | ORAL_TABLET | Freq: Three times a day (TID) | ORAL | Status: DC

## 2024-04-09 MED ORDER — ADENOSINE 6 MG/2ML IV SOLN
INTRAVENOUS | Status: AC
  Administered 2024-04-09: 6 mg via INTRAVENOUS
  Filled 2024-04-09: qty 6

## 2024-04-09 NOTE — Progress Notes (Addendum)
 Patient ID: Chelsea Hansen, female   DOB: 1971-05-18, 53 y.o.   MRN: [id number] [facility] Progress Note   Assessment/ Plan:   1.  Hypocalcemia: Secondary to recent denosumab  use in a patient with advanced breast cancer and metastasis to bones/peritoneum.  Continue aggressive calcium  replacement along with vitamin D  and calcitriol  to help hasten absorption.  She will need supplemental therapy until effects of denosumab  wear off on bone turnover.  Will switch fluids to LR from isotonic sodium bicarbonate. 2.  Supraventricular tachycardia: Transferred to ICU and being treated with adenosine.  Appears hemodynamically stable. 3.  Hypomagnesemia:  continue oral replacement status post earlier intravenous magnesium  supplementation. 4.  Hypophosphatemia: Begin oral phosphorus supplementation to complement recent intravenous supplement. 5.  Hyponatremia: Atypical/unclear etiology, monitor with switch of IV fluids and controlling SVT.  Subjective:   Transferred to ICU with SVT/relative hypotension   Objective:   BP (!) 151/94 (BP Location: Right Arm)   Pulse (!) 140   Temp 99.8 F (37.7 C)   Resp 16   Ht 5' 11 (1.803 m)   Wt 74.5 kg   SpO2 99%   BMI 22.91 kg/m   Intake/Output Summary (Last 24 hours) at 04/09/2024 1356 Last data filed at 04/09/2024 0900 Gross per 24 hour  Intake 240 ml  Output --  Net 240 ml   Weight change:   Physical Exam: Gen: Chronically ill-appearing woman who looks fatigued, responds to questions CVS: Regular tachycardia, S1 and S2 normal without murmur Resp: Diminished breath sounds over bases, poor inspiratory effort.  No rales/rhonchi Abd: Soft, flat, G-tube in situ with some tenderness around it.  Colostomy bag in place. Ext: No lower extremity edema  Imaging: No results found.  Labs: BMET Recent Labs  Lab 04/03/24 0540 04/04/24 0255 04/04/24 1808 04/05/24 0014 04/05/24 1748 04/06/24 0434 04/07/24 0348 04/08/24 0240  04/09/24 0309  NA 136 137 137 134* 135 137 136 136 132*  K 4.0 4.1 4.0 4.7 4.7 3.9 3.9 3.7 3.6  CL 107 106 105 103 102 105 105 102 98  CO2 19* 22 22 23 23 24 22 23 24   GLUCOSE 87 78 113* 160* 130* 93 136* 107* 118*  BUN <5* <5* <5* <5* <5* <5* 5* 5* 8  CREATININE <0.30* <0.30* <0.30* <0.30* 0.34* <0.30* 0.37* 0.35* 0.37*  CALCIUM  4.9* 5.3* 6.0* 6.5* 6.7* 6.0* 6.8*  6.9* 7.0* 6.8*  PHOS 1.8* 2.2*  --  1.9*  --  1.8* 1.8* 1.7* 1.7*   CBC Recent Labs  Lab 04/03/24 0540 04/05/24 0014 04/07/24 0348 04/07/24 1745 04/08/24 0248 04/09/24 0309  WBC 3.2*   < > 5.0 5.8 6.4 4.9  NEUTROABS 1.8  --   --   --   --   --   HGB 8.7*   < > 8.4* 8.5* 8.9* 8.5*  HCT 27.1*   < > 26.4* 26.8* 27.6* 25.6*  MCV 81.9   < > 83.8 83.5 82.4 82.3  PLT 152   < > 161 165 169 122*   < > = values in this interval not displayed.    Medications:     calcitRIOL   0.25 mcg Per Tube BID   calcium  carbonate (dosed in mg elemental calcium )  2,500 mg of elemental calcium  Per Tube TID BM   Chlorhexidine  Gluconate Cloth  6 each Topical Daily   dexamethasone  (DECADRON ) injection  4 mg Intravenous Q12H   docusate  200 mg Per Tube Daily   enoxaparin  (LOVENOX ) injection  40  mg Subcutaneous Daily   ergocalciferol  (VITAMIN D2)  8,000 Units Per Tube Daily   feeding supplement  1 Container Oral TID WC   feeding supplement (KATE FARMS STANDARD 1.4)  325 mL Oral BID BM   fentaNYL   1 patch Transdermal Q72H   magnesium  oxide  400 mg Oral BID   multivitamin with minerals  1 tablet Per Tube Daily   OLANZapine  zydis  5 mg Oral QHS   pantoprazole  (PROTONIX ) IV  40 mg Intravenous Q24H   phosphorus  250 mg Oral TID   sodium chloride  flush  10-40 mL Intracatheter Q12H   sorbitol   30 mL Per Tube [id number]    Gordy Blanch, MD 04/09/2024, 1:56 PM

## 2024-04-09 NOTE — Progress Notes (Signed)
   04/09/24 1630  Spiritual Encounters  Type of Visit Initial  Care provided to: Pt and family  Referral source Chaplain assessment   I offered a brief follow up visit to inquire about Ms. Grundman's well being since health status changes this afternoon. Able to meet sons, Curtistine and Gayle at bedside with significant others.  I encouraged Ms Deridder's rest and let her know that we would return to attempt changes to her HCPOA document when appropriate. I let family that a chaplain would be available to answer questions and for support.  Labresha Mellor L. Delores [facility]

## 2024-04-09 NOTE — Plan of Care (Signed)
  Problem: Education: Goal: Knowledge of General Education information will improve Description: Including pain rating scale, medication(s)/side effects and non-pharmacologic comfort measures Outcome: Progressing   Problem: Health Behavior/Discharge Planning: Goal: Ability to manage health-related needs will improve Outcome: Progressing   Problem: Clinical Measurements: Goal: Ability to maintain clinical measurements within normal limits will improve Outcome: Progressing Goal: Will remain free from infection Outcome: Progressing Goal: Diagnostic test results will improve Outcome: Progressing Goal: Respiratory complications will improve Outcome: Progressing Goal: Cardiovascular complication will be avoided Outcome: Progressing   Problem: Activity: Goal: Risk for activity intolerance will decrease Outcome: Progressing   Problem: Nutrition: Goal: Adequate nutrition will be maintained Outcome: Progressing   Problem: Coping: Goal: Level of anxiety will decrease Outcome: Progressing   Problem: Elimination: Goal: Will not experience complications related to bowel motility Outcome: Progressing Goal: Will not experience complications related to urinary retention Outcome: Progressing   Problem: Pain Managment: Goal: General experience of comfort will improve and/or be controlled Outcome: Progressing   Problem: Safety: Goal: Ability to remain free from injury will improve Outcome: Progressing   Problem: Skin Integrity: Goal: Risk for impaired skin integrity will decrease Outcome: Progressing   Problem: Nutrition Goal: Nutritional status is improving Description: Monitor and assess patient for malnutrition (ex- brittle hair, bruises, dry skin, pale skin and conjunctiva, muscle wasting, smooth red tongue, and disorientation). Collaborate with interdisciplinary team and initiate plan and interventions as ordered.  Monitor patient's weight and dietary intake as ordered or per  policy. Utilize nutrition screening tool and intervene per policy. Determine patient's food preferences and provide high-protein, high-caloric foods as appropriate.  Outcome: Progressing

## 2024-04-09 NOTE — Progress Notes (Signed)
   04/09/24 1150  Spiritual Encounters  Type of Visit Attempt (pt unavailable)  Referral source Chaplain team   Per referral from Chaplain Lisa Lundeen, I attempted to visit with Chelsea Chelsea Hansen to assist with changes to her HCPOA.  At time of visit, Chelsea Hansen was going to CT scan. Will follow up as able or refer to chaplain for assistance on 10/21.  Harless Molinari L. Delores [facility]

## 2024-04-09 NOTE — Progress Notes (Signed)
 PMT progress note  Patient is able to ambulate to and from her bathroom, mother and step father at bedside, patient's mother assisting patient  Patient states that she had a rough night, she states she developed a fever, her HR was in the 140s and that she is going to be placed back on a monitor.  Patient's mother is an Charity fundraiser who states that the patient has had blood cultures collected earlier today.  Patient asks for pain medication at time of visit.    Chart reviewed Medication history noted Oncology TRH and TOC notes reviewed BP (!) 151/94 (BP Location: Right Arm)   Pulse (!) 140   Temp 99.8 F (37.7 C)   Resp 16   Ht 5' 11 (1.803 m)   Wt 74.5 kg   SpO2 99%   BMI 22.91 kg/m  Appears in mild distress No edema Awake alert but with generalized pain Appears uncomfortable.   Ca levels being monitored closely.    Renal services also following along, patient on IVF Bicarb, patient on Mag replacement, patient on Ca gluconate IV, PO Vit D. Shared with patient about her current Ca level as per her request.  As per oncology documentation - patient remains invested in continuing cancer directed therapies, remains full code, full scope care for now. Patient already a patient of my colleague Ms Cousar NP Palliative at the cancer center.  From palliative standpoint - continue current pain and anti emetic regimen for now Medication history noted.   TDF 50 mcg Q 72 hours and monitor.  Remains on IV Dexamethasone , once symptoms more better controlled - will switch to decadron  taper.  Remains on Zyprexa  low dose Q HS for now for anti emetic assistance.   PMT to follow    Mod MDM Lonia Serve MD [facility] palliative.

## 2024-04-09 NOTE — Consult Note (Signed)
 NAME:  JACK MINEAU, MRN:  [id number], DOB:  06/28/1970, LOS: 7 ADMISSION DATE:  04/01/2024 CONSULTATION DATE:  04/09/2024 REFERRING MD:  Raenelle - TRH, CHIEF COMPLAINT:  Hypotension, SVT   History of Present Illness:  53 year old woman who presented to [facility] 10/12 for n/v/d and electrolyte derangements. 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 Recent admission 9/29-10/8 for similar.  Initial admission 10/12 for n/v/d and electrolyte derangements; admitted to [facility]. Nephrology consulted for assistance with management of electrolyte abnormalities. Oncology following. PMT following for GOC and assistance with chronic pain management.  On 10/20, patient was noted to be febrile to 102F with abdominal pain (acute-on-chronic) and leaking G-tube. Patient was noted to be hypotensive and 2L fluid bolus was administered and broad-spectrum antibiotics (Zosyn ) started. PCT 9.11, LA 3.1, improved to 1.0 after fluids. Transferred to SDU/ICU. Patient was noted to be in SVT with HR 170s; adenosine 6mg  then 12mg  administered with persistent tachycardia. Patient became progressively hypotensive with SBP 80s and MAP 50s prompting initiation of Levophed. Repeat CT A/P pending.  PCCM consulted for ICU evaluation and management.  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 Lynwood Nasuti   Hyperlipidemia    on meds   Hypertension    on meds   Peritoneal carcinomatosis (HCC)    S/P percutaneous endoscopic gastrostomy (PEG) tube placement [facility])    SVT (supraventricular tachycardia)    Significant Hospital Events: Including procedures, antibiotic start and stop dates in addition to other pertinent events   10/20 - PCCM consulted for ICU evaluation and management.  Interim History / Subjective:  PCCM consulted for ICU evaluation.  Objective:  Blood pressure (!) 94/51, pulse (!) 110, temperature (!) 100.7 F (38.2 C), temperature source Oral, resp. rate (!) 23, height 5' 11 (1.803 m), weight 74.5 kg, SpO2 100%.        Intake/Output Summary (Last 24 hours) at 04/09/2024 1658 Last data filed at 04/09/2024 1640 Gross per 24 hour  Intake 5846.46 ml  Output --  Net 5846.46 ml   Filed Weights   04/06/24 0505 04/07/24 0504 04/08/24 0514  Weight: 76 kg 75.1 kg 74.5 kg   Physical Examination: General: Acute-on-chronically ill-appearing middle-aged woman in NAD. HEENT: [place] 6/AT, anicteric sclera, PERRL, moist mucous membranes. Neuro: Drowsy, but wakes easily to voice. Oriented x 4. Responds to verbal stimuli. Following commands consistently. Moves all 4 extremities spontaneously. Generalized weakness. CV: Tachycardic, regular rhythm, no m/g/r. PULM: Breathing even and unlabored on 2LNC. Lung fields CTAB. GI: Soft,  mildly distended, diffuse mild generalized TTP. Hypoactive bowel sounds. RLQ prior ostomy site with thin serous output/solid yellow debris. PEG in LUQ with serous leakage. Extremities: No LE edema noted. Skin: Warm/dry, no rashes.  Resolved  Hospital Problem List:    Assessment & Plan:  Undifferentiated shock, presume septic - Transferred to ICU - Goal MAP > 65 - Fluid resuscitation as tolerated, addition bolus now - Levophed titrated to goal MAP + vasopressin fixed dose 0.03 - SDS added, hydrocortisone 100mg  Q8H - Trend WBC, fever curve, LA (resolved) - F/u Cx data - Continue broad-spectrum antibiotics (Vanc/Zosyn ) - F/u CT  Tachycardia History of SVT S/p adenosine 6mg , 12mg . 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 - F/u repeat CT A/P - PEG tube care per protocol - Nutrition/RD consulted - PPI - Bowel regimen - WOC  Multiple electrolyte abnormalities Hypocalcemia Hypokalemia Hypomagnesemia Hypophosphatemia - Nephrology following - Trend BMP - Replete electrolytes as indicated - Monitor I&Os - F/u urine studies - Avoid nephrotoxic agents as able - Ensure adequate renal perfusion  Anxiety/depression - Continue Zyprexa   Labs:  CBC: Recent Labs  Lab 04/03/24 0540 04/05/24 0014 04/06/24 0434 04/07/24 0348 04/07/24 1745 04/08/24 0248 04/09/24 0309  WBC 3.2*   < > 4.3 5.0 5.8 6.4 4.9  NEUTROABS 1.8  --   --   --   --   --   --   HGB 8.7*   < > 8.7* 8.4* 8.5* 8.9* 8.5*  HCT 27.1*   < > 27.9* 26.4* 26.8* 27.6* 25.6*  MCV 81.9   < > 82.3 83.8 83.5 82.4 82.3  PLT 152   < > 179 161 165 169 122*   < > = values in this interval not displayed.   Basic Metabolic Panel: Recent Labs  Lab  04/05/24 0014 04/05/24 1748 04/06/24 0434 04/07/24 0348 04/08/24 0240 04/08/24 0248 04/09/24 0309 04/09/24 0830  NA 134* 135 137 136 136  --  132*  --   K 4.7 4.7 3.9 3.9 3.7  --  3.6  --   CL 103 102 105 105 102  --  98  --   CO2 23 23 24 22 23   --  24  --   GLUCOSE 160* 130* 93 136* 107*  --  118*  --   BUN <5* <5* <5* 5* 5*  --  8  --   CREATININE <0.30* 0.34* <0.30* 0.37* 0.35*  --  0.37*  --   CALCIUM  6.5* 6.7* 6.0* 6.8*  6.9* 7.0*  --  6.8*  --   MG 2.5*  --  2.0 1.8  --  1.6*  --  2.6*  PHOS 1.9*  --  1.8* 1.8* 1.7*  --  1.7*  --    GFR: Estimated Creatinine Clearance: 93 mL/min (A) (by C-G formula based on SCr of 0.37 mg/dL (L)). Recent Labs  Lab 04/07/24 0348 04/07/24 1745 04/08/24 0248 04/09/24 0309 04/09/24 0830 04/09/24 1107  PROCALCITON  --   --   --   --  9.11  --   WBC 5.0  5.8 6.4 4.9  --   --   LATICACIDVEN  --   --   --   --  3.1* 1.0   Liver Function Tests: Recent Labs  Lab 04/05/24 0014 04/06/24 0434 04/07/24 0348 04/08/24 0240 04/09/24 0309  ALBUMIN  3.3* 3.3* 3.2* 3.4* 3.3*   No results for input(s): LIPASE, AMYLASE in the last 168 hours. No results for input(s): AMMONIA in the last 168 hours.  ABG:    Component Value Date/Time   TCO2 28 03/04/2024 2316    Coagulation Profile: No results for input(s): INR, PROTIME in the last 168 hours.  Cardiac Enzymes: No results for input(s): CKTOTAL, CKMB, CKMBINDEX, TROPONINI in the last 168 hours.  HbA1C: No results found for: HGBA1C  CBG: No results for input(s): GLUCAP in the last 168 hours.  Review of Systems:   Review of systems completed with pertinent positives/negatives outlined in above HPI.  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   Surgical History:   Past Surgical History:  Procedure Laterality Date   ABDOMINAL HYSTERECTOMY     AXILLARY SENTINEL NODE BIOPSY Left 07/09/2021   Procedure: LEFT AXILLARY SENTINEL NODE BIOPSY;  Surgeon: [name], MD;  Location: [facility];  Service: General;  Laterality: Left;   BALLOON DILATION N/A 12/13/2023   Procedure: BALLOON DILATION;  Surgeon: Federico Rosario BROCKS, MD;  Location: [facility];  Service: Gastroenterology;  Laterality: N/A;   BIOPSY  06/29/2022   Procedure: BIOPSY;  Surgeon: Legrand Victory LITTIE DOUGLAS, MD;  Location: [facility];  Service: Gastroenterology;;   BIOPSY OF SKIN SUBCUTANEOUS TISSUE AND/OR MUCOUS MEMBRANE  12/13/2023   Procedure: BIOPSY, SKIN, SUBCUTANEOUS TISSUE, OR MUCOUS MEMBRANE;  Surgeon: Federico Rosario BROCKS, MD;  Location: [facility] END[facility]COPY;  Service: Gastroenterology;;   BREAST EXCISIONAL BIOPSY Right 09/16/2009   BREAST LUMPECTOMY WITH RADIOACTIVE SEED LOCALIZATION Bilateral 07/09/2021   Procedure: BILATERAL BREAST LUMPECTOMY WITH RADIOACTIVE SEED LOCALIZATION;  Surgeon: [name], MD;  Location: [facility];  Service: General;  Laterality: Bilateral;   BREAST SURGERY     lumpectomy   ESOPHAGOGASTRODUODENOSCOPY N/A 12/13/2023   Procedure: EGD (ESOPHAGOGASTRODUODENOSCOPY);  Surgeon: Federico Rosario BROCKS, MD;  Location: [facility];  Service: Gastroenterology;  Laterality: N/A;   ESOPHAGOGASTRODUODENOSCOPY N/A 12/26/2023   Procedure: EGD (ESOPHAGOGASTRODUODENOSCOPY);  Surgeon: Wilhelmenia Aloha Raddle., MD;  Location: [facility];  Service: Gastroenterology;  Laterality: N/A;   ESOPHAGOGASTRODUODENOSCOPY N/A  12/27/2023   Procedure: EGD (ESOPHAGOGASTRODUODENOSCOPY);  Surgeon: Wilhelmenia Aloha Raddle., MD;  Location: [facility];  Service: Gastroenterology;  Laterality: N/A;   ESOPHAGOGASTRODUODENOSCOPY N/A 03/08/2024   Procedure: EGD (ESOPHAGOGASTRODUODENOSCOPY);  Surgeon: Wilhelmenia Aloha Raddle., MD;  Location: [facility];  Service: Gastroenterology;  Laterality: N/A;   ESOPHAGOGASTRODUODENOSCOPY (EGD) WITH PROPOFOL  N/A 06/29/2022   Procedure: ESOPHAGOGASTRODUODENOSCOPY (EGD) WITH PROPOFOL ;  Surgeon: Legrand Victory LITTIE DOUGLAS, MD;  Location: [facility];  Service: Gastroenterology;  Laterality: N/A;   EUS N/A 12/26/2023   Procedure: ULTRASOUND, UPPER GI TRACT, ENDOSCOPIC;  Surgeon: Wilhelmenia Aloha Raddle., MD;  Location: [facility];  Service: Gastroenterology;  Laterality: N/A;   EUS N/A 12/27/2023   Procedure: ULTRASOUND, UPPER GI  TRACT, ENDOSCOPIC;  Surgeon: Mansouraty, Aloha Raddle., MD;  Location: [facility];  Service: Gastroenterology;  Laterality: N/A;   EUS N/A 03/08/2024   Procedure: ULTRASOUND, UPPER GI TRACT, ENDOSCOPIC;  Surgeon: Wilhelmenia Aloha Raddle., MD;  Location: [facility];  Service: Gastroenterology;  Laterality: N/A;   IR CM INJ ANY COLONIC TUBE W/FLUORO  01/20/2024   IR IMAGING GUIDED PORT INSERTION  02/06/2024   IR REPLC DUODEN/JEJUNO TUBE PERCUT W/FLUORO  02/03/2024   IR REPLC GASTRO/COLONIC TUBE PERCUT W/FLUORO  01/20/2024   LAPAROSCOPIC INSERTION GASTROSTOMY TUBE N/A 01/10/2024   Procedure: laparoscopic insertion of gastrostomy tube, laparoscopic insertion of jejunostomy tube, upper endoscopy;  Surgeon: Kinsinger, Herlene Righter, MD;  Location: [facility];  Service: General;  Laterality: N/A;  Laproscopic insertion   LAPAROSCOPIC ROUX-EN-Y GASTRIC BYPASS WITH HIATAL HERNIA REPAIR N/A 01/02/2024   Procedure: LAPAROSCOPIC CREATION GASTRJEJONOSTOMY;  Surgeon: Lyndel Deward PARAS, MD;  Location: [facility];  Service: General;  Laterality: N/A;  GASTROJEJUNOSTOMY   PORT-A-CATH REMOVAL Left 07/09/2021    Procedure: REMOVAL PORT-A-CATH;  Surgeon: [name], MD;  Location: [facility];  Service: General;  Laterality: Left;   PORTACATH PLACEMENT Left 01/26/2021   Procedure: INSERTION PORT-A-CATH;  Surgeon: [name], MD;  Location: [facility];  Service: General;  Laterality: Left;   RADIOACTIVE SEED GUIDED AXILLARY SENTINEL LYMPH NODE Right 07/09/2021   Procedure: RADIOACTIVE SEED GUIDED RIGHT AXILLARY SENTINEL LYMPH NODE DISSECTION;  Surgeon: [name], MD;  Location: [facility];  Service: General;  Laterality: Right;   TUBAL LIGATION     Social History:   reports that she has never smoked. She has never used smokeless tobacco. She reports that she does not currently use alcohol after a past usage of about 7.0 standard drinks of alcohol per week. She reports that she does not use drugs.   Family History:  Her family history includes Breast cancer in her maternal aunt and mother; Breast cancer (age of onset: 32) in her cousin; Hypertension in her father; Ovarian cancer in her maternal aunt; Uterine cancer in her maternal aunt; Uterine cancer (age of onset: 70) in her cousin. There is no history of Colon polyps, Colon cancer, Esophageal cancer, or Stomach cancer.   Allergies: Allergies  Allergen Reactions   Enhertu  [Fam-Trastuzumab  Deruxtec-Nxki] Other (See Comments)    Headache and chest pain. See progress note from 02/16/2024. Patient able to complete infusion.   Xgeva  [Denosumab ] Other (See Comments)    Severe prolonged hypocalcemia needing hospitalization   Home Medications: Prior to Admission medications   Medication Sig Start Date End Date Taking? Authorizing Provider  acetaminophen  (TYLENOL ) 325 MG tablet Take 2 tablets (650 mg total) by mouth every 6 (six) hours as needed for mild pain (pain score 1-3) or fever (or Fever >/= 101). 03/09/24  Yes Rosario Leatrice FERNS, MD  calcitRIOL  (ROCALTROL ) 0.25 MCG capsule Take 1 capsule (0.25 mcg total)  by mouth 2 (two) times daily. 03/30/24  Yes Causey, Morna Pickle, NP  Calcium  Carb-Cholecalciferol  (CALCIUM  600+D) 600-10 MG-MCG TABS Take 1 tablet by mouth 3 (three) times daily with meals. 03/30/24  Yes Causey, Morna Pickle, NP  fentaNYL  (DURAGESIC ) 25 MCG/HR Place 1 patch onto the skin every 3 (three) days. 03/11/24  Yes Rosario Leatrice FERNS, MD  lidocaine -prilocaine  (EMLA ) cream Apply to affected area once Patient taking differently: Apply 1 Application topically daily as needed (for port). 02/08/24  Yes [name], MD  magnesium  oxide (MAG-OX) 400 (240 Mg) MG tablet Take 1 tablet (400 mg total)  by mouth 2 (two) times daily. 03/28/24 06/26/24 Yes [place], Eric J, DO  ondansetron  (ZOFRAN ) 8 MG tablet Take 1 tablet (8 mg total) by mouth every 8 (eight) hours as needed for nausea or vomiting. Start on the third day after chemotherapy. 02/08/24  Yes [name], [name], MD  oxyCODONE  (ROXICODONE ) 15 MG immediate release tablet Take 1 tablet (15 mg total) by mouth every 4 (four) hours as needed for pain. 03/29/24  Yes [name], [name], NP  prochlorperazine  (COMPAZINE ) 10 MG tablet Take 1 tablet (10 mg total) by mouth every 6 (six) hours as needed for nausea or vomiting. 02/08/24  Yes [name], MD   Critical care time:    The patient is critically ill with multiple organ system failure and requires high complexity decision making for assessment and support, frequent evaluation and titration of therapies, advanced monitoring, review of radiographic studies and interpretation of complex data.   Critical Care Time devoted to patient care services, exclusive of separately billable procedures, described in this note is 39 minutes.  Corean CHRISTELLA Jermon Chalfant, PA-C [facility] 04/09/24 4:58 PM  Please see [email or web address] for pager details.  From 7A-7P if no response, please call [phone] After hours, please call ELink [phone]

## 2024-04-09 NOTE — Progress Notes (Signed)
 eLink Physician-Brief Progress Note Patient Name: Chelsea Hansen DOB: January 30, 1971 MRN: [id number]   Date of Service  04/09/2024  HPI/Events of Note  Received inquiry if G tube be taken off suction and clamped to give calcium  carbonate per tube  Patient denies nausea and abdomen not distended  eICU Interventions  Ok to remove suction for medications Discussed with BSRN to confirm if calcium  carbonate can be given via G tube.     Intervention Category Intermediate Interventions: Other:  Damien ONEIDA Grout 04/09/2024, 8:39 PM

## 2024-04-09 NOTE — Progress Notes (Signed)
 Pharmacy Antibiotic Note  Chelsea Hansen is a 53 y.o. female admitted on 04/01/2024 with wound infection.  Pharmacy has been consulted for vancomycin  dosing.  Plan: Vancomycin  1500mg  IV x 1 then 1g IV q12 - goal AUC 400-550 Zosyn  per Md Check SCr daily   Height: 5' 11 (180.3 cm) Weight: 74.5 kg (164 lb 3.9 oz) IBW/kg (Calculated) : 70.8  Temp (24hrs), Avg:100.4 F (38 C), Min:98.6 F (37 C), Max:102.9 F (39.4 C)  Recent Labs  Lab 04/05/24 1748 04/06/24 0434 04/07/24 0348 04/07/24 1745 04/08/24 0240 04/08/24 0248 04/09/24 0309 04/09/24 0830 04/09/24 1107  WBC  --  4.3 5.0 5.8  --  6.4 4.9  --   --   CREATININE 0.34* <0.30* 0.37*  --  0.35*  --  0.37*  --   --   LATICACIDVEN  --   --   --   --   --   --   --  3.1* 1.0    Estimated Creatinine Clearance: 93 mL/min (A) (by C-G formula based on SCr of 0.37 mg/dL (L)).    Allergies  Allergen Reactions   Enhertu  [Fam-Trastuzumab  Deruxtec-Nxki] Other (See Comments)    Headache and chest pain. See progress note from 02/16/2024. Patient able to complete infusion.   Xgeva  [Denosumab ] Other (See Comments)    Severe prolonged hypocalcemia needing hospitalization      Thank you for allowing pharmacy to be a part of this patient's care.  Britta Eva Na 04/09/2024 4:59 PM

## 2024-04-09 NOTE — Progress Notes (Signed)
   04/09/24 0536  Assess: MEWS Score  Temp (!) 102.9 F (39.4 C)  BP (!) 85/56  MAP (mmHg) 66  Pulse Rate (!) 141  Resp 19  SpO2 100 %  O2 Device Room Air  Assess: MEWS Score  MEWS Temp 2  MEWS Systolic 1  MEWS Pulse 3  MEWS RR 0  MEWS LOC 0  MEWS Score 6  MEWS Score Color Red  Assess: if the MEWS score is Yellow or Red  Were vital signs accurate and taken at a resting state? Yes  Does the patient meet 2 or more of the SIRS criteria? Yes  Does the patient have a confirmed or suspected source of infection? Yes  MEWS guidelines implemented  Yes, red  Treat  MEWS Interventions Considered administering scheduled or prn medications/treatments as ordered  Take Vital Signs  Increase Vital Sign Frequency  Red: Q1hr x2, continue Q4hrs until patient remains green for 12hrs  Escalate  MEWS: Escalate Red: Discuss with charge nurse and notify provider. Consider notifying RRT. If remains red for 2 hours consider need for higher level of care  Notify: Charge Nurse/RN  Name of Charge Nurse/RN Notified T Brigid Vandekamp  Provider Notification  Provider Name/Title daniels  Date Provider Notified 04/09/24  Time Provider Notified [phone]  Method of Notification Page  Notification Reason Change in status  Provider response En route  Date of Provider Response 04/09/24  Time of Provider Response 0542  Notify: Rapid Response  Name of Rapid Response RN Notified Mandy  Date Rapid Response Notified 04/09/24  Time Rapid Response Notified 0542  Assess: SIRS CRITERIA  SIRS Temperature  1  SIRS Respirations  0  SIRS Pulse 1  SIRS WBC 0  SIRS Score Sum  2

## 2024-04-09 NOTE — Progress Notes (Signed)
 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  Recently admitted 9/29-10/8 with similar issues and going home.  This is her third admission in 1 month. In the emergency room hemodynamically stable.  Hemoglobin is stable.  Potassium 3.2, calcium  5.4.  Phosphorus was low.  CT scan with peritoneal carcinomatosis, improved small fluid collection in the left abdominal wall.  Admitted for symptomatic treatment. Remains in the hospital.  She is on around-the-clock IV Dilaudid  for pain control, on fentanyl .  Also challenging to keep up her electrolytes.  Oncology, palliative, nephrology following.  10/20, early morning hours with high fever and excess drainage from the G-tube site.  Subjective:  Patient seen and examined.  Walking back from the bathroom and complaining of pain.  She is asking for Dilaudid . Overnight events noted.  Patient started having temperature 102 middle of the night.  Denies any cough congestion or sputum production.  She does have continued epigastric area pain which is mostly chronic.  Nursing reported more drainage from the fistula. Mother arrived at the bedside.  We discussed about difficulties in treatment, currently starting antibiotics. Blood pressure was low.  Lactic acid was 3.  2 L fluid bolus given with improvement of blood pressure.  Repeat lactic acid pending.  Procalcitonin more than 9.  Starting on Zosyn  for presumed  intra-abdominal infection.  Repeat CT scan with contrast today.  Blood cultures drawn.  Urine is clear.   Assessment & Plan:   Sepsis, not present on admission: Developed high fever, lactic acidosis with possible intra-abdominal source of infection, possible fluid collection. 2 L fluid bolus given.  Repeat lactic acid.  Blood cultures drawn.  Started patient on Zosyn . Will monitor. CT scan abdomen pelvis with contrast.  Probably has some intra-abdominal fluid collection but already draining through the G-tube site and through the ileostomy site.  Intractable nausea and vomiting.  Recurrent problem.  Due to carcinomatosis. CT abdomen without any evidence of obstruction. Palliative care also consulted. Decadron  recommended by palliative care for refractory nausea and vomiting. On Phenergan  and Decadron . Zyprexa  . GI also consulted for now no new recommendation. Continue PPI.  Continue supportive care. Patient is currently forcing herself to eat, however this remains very challenging.   Moderate protein calorie malnutrition. Secondary poor p.o. intake due to ongoing GI symptoms. Significant concern for patient's prognosis. Patient unable to tolerate tube feeding via tubes in the past. 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 Patient does not use G-tube for nutrition. Will monitor for improvement in symptoms. Appreciate palliative care consultation.   Acute on chronic abdominal pain related to cancer. Patient is on fentanyl  patch chronically.  Dose increased on 10/15 from 25-37.5. Also on as needed IV Dilaudid . She recently underwent celiac plexus block without any significant improvement.   Severe hypokalemia, severe hypocalcemia, hypophosphatemia. Hypomagnesemia. Currently being treated aggressively.   Nephrology following. On a scheduled high-dose calcium , calcitriol , vitamin D .  Also on IV calcium . IV magnesium  and  phosphorus. Levels remain persistently low.   Goals of care conversation. Appreciate Palliative care  consultation. Currently full code.  Wants full code scope of treatment. Prognosis is poor.  Wound healing is poor.   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 Left lower quadrant enterocutaneous fistula. S/p laparoscopic gastrojejunostomy bypass 01/02/24 by Dr. Lyndel Subsequently underwent G tube and J tube placement 01/10/24 Readmitted 8/14 for FTT and G and J tube malposition. G tube replaced by IR 8/15. J tube ultimately removed due to obstruction.  Eakins pouch in place, increased output with peritoneal-cutaneous fistula. Wound care consulted due to leaking.  Repeat CT scan today. not a candidate for surgical management of possible primary pancreatic malignancy in the setting of her metastatic breast cancer with carcinomatosis.  Has a venting G-tube.  Which is currently being used for medications.   Vitamin D  deficiency. Replacing with D2.   Bleeding from the enterocutaneous fistula. Patient has a jejunal fistula which led to some bleeding on the morning of 10/17. Discussed with general surgery, recommend close monitoring. Lovenox  held.  No further bleeding.  Lovenox  resumed on 10/18.  Tolerating well.  No further bleeding.  DVT prophylaxis: enoxaparin  (LOVENOX ) injection 40 mg Start: 04/09/24 1000Subcu Lovenox .   Code Status: Full code Family Communication: Mother and father at the bedside. Disposition Plan: Status is: Inpatient.  Persistent severe electrolyte abnormalities.  Now developing fever.     Consultants:  Oncology Palliative Gastroenterology Nephrology  Procedures:  None  Antimicrobials:  Zosyn  10/20---     Objective: Vitals:   04/09/24 0551 04/09/24 0714 04/09/24 0753 04/09/24 1037  BP: 106/68 (!) 80/53 96/63 115/75  Pulse:  (!) 137 (!) 118 (!) 105 85  Resp:   19 18  Temp:  (!) 100.5 F (38.1 C) 100 F (37.8 C)   TempSrc:  Oral Oral   SpO2: 98% 100% 100% 100%  Weight:      Height:        Intake/Output Summary (Last 24 hours) at 04/09/2024 1041 Last data filed at 04/09/2024 0900 Gross per 24 hour  Intake 240 ml  Output --  Net 240 ml   Filed Weights   04/06/24 0505 04/07/24 0504 04/08/24 0514  Weight: 76 kg 75.1 kg 74.5 kg    Examination:  General: Chronically sick looking.  Not in any distress.  On room air.  Walking around in the room.  On room air. Cardiovascular: S1-S2 normal.  Regular rate rhythm. Respiratory: Bilateral clear.  No added sounds. Gastrointestinal: Soft.  Mildly tender along the G-tube.  Profuse drainage surrounding the G-tube site.  Colostomy bag with feculent loose material.  No bleeding. Ext: No swelling edema. Neuro: Alert awake and oriented.  No focal logical deficits.     Data Reviewed: I have personally reviewed following labs and imaging studies  CBC: Recent Labs  Lab 04/03/24 0540 04/05/24 0014 04/06/24 0434 04/07/24 0348 04/07/24 1745 04/08/24 0248 04/09/24 0309  WBC 3.2*   < > 4.3 5.0 5.8 6.4 4.9  NEUTROABS 1.8  --   --   --   --   --   --   HGB 8.7*   < > 8.7* 8.4* 8.5* 8.9* 8.5*  HCT 27.1*   < > 27.9* 26.4* 26.8* 27.6* 25.6*  MCV 81.9   < > 82.3 83.8 83.5 82.4 82.3  PLT 152   < > 179 161 165 169 122*   < > = values in this interval not displayed.   Basic Metabolic Panel: Recent  Labs  Lab 04/05/24 0014 04/05/24 1748 04/06/24 0434 04/07/24 0348 04/08/24 0240 04/08/24 0248 04/09/24 0309 04/09/24 0830  NA 134* 135 137 136 136  --  132*  --   K 4.7 4.7 3.9 3.9 3.7  --  3.6  --   CL 103 102 105 105 102  --  98  --   CO2 23 23 24 22 23   --  24  --   GLUCOSE 160* 130* 93 136* 107*  --  118*  --   BUN <5* <5* <5* 5* 5*  --  8  --   CREATININE <0.30* 0.34* <0.30* 0.37* 0.35*  --  0.37*  --   CALCIUM  6.5* 6.7* 6.0* 6.8*  6.9* 7.0*  --  6.8*   --   MG 2.5*  --  2.0 1.8  --  1.6*  --  2.6*  PHOS 1.9*  --  1.8* 1.8* 1.7*  --  1.7*  --    GFR: Estimated Creatinine Clearance: 93 mL/min (A) (by C-G formula based on SCr of 0.37 mg/dL (L)). Liver Function Tests: Recent Labs  Lab 04/05/24 0014 04/06/24 0434 04/07/24 0348 04/08/24 0240 04/09/24 0309  ALBUMIN  3.3* 3.3* 3.2* 3.4* 3.3*   No results for input(s): LIPASE, AMYLASE in the last 168 hours.  No results for input(s): AMMONIA in the last 168 hours. Coagulation Profile: No results for input(s): INR, PROTIME in the last 168 hours. Cardiac Enzymes: No results for input(s): CKTOTAL, CKMB, CKMBINDEX, TROPONINI in the last 168 hours. BNP (last 3 results) No results for input(s): PROBNP in the last 8760 hours. HbA1C: No results for input(s): HGBA1C in the last 72 hours. CBG: No results for input(s): GLUCAP in the last 168 hours. Lipid Profile: No results for input(s): CHOL, HDL, LDLCALC, TRIG, CHOLHDL, LDLDIRECT in the last 72 hours. Thyroid  Function Tests: No results for input(s): TSH, T4TOTAL, FREET4, T3FREE, THYROIDAB in the last 72 hours. Anemia Panel: No results for input(s): VITAMINB12, FOLATE, FERRITIN, TIBC, IRON, RETICCTPCT in the last 72 hours. Sepsis Labs: Recent Labs  Lab 04/09/24 0830  PROCALCITON 9.11  LATICACIDVEN 3.1*    Recent Results (from the past 240 hours)  C Difficile Quick Screen w PCR reflex     Status: None   Collection Time: 04/01/24 10:21 PM   Specimen: STOOL  Result Value Ref Range Status   C Diff antigen NEGATIVE NEGATIVE Final   C Diff toxin NEGATIVE NEGATIVE Final   C Diff interpretation No C. difficile detected.  Final    Comment: Performed at [facility], [address], [place], [place] [address]  Gastrointestinal Panel by PCR , Stool     Status: None   Collection Time: 04/01/24 10:21 PM   Specimen: STOOL  Result Value Ref Range Status   Campylobacter  species NOT DETECTED NOT DETECTED Final   Plesimonas shigelloides NOT DETECTED NOT DETECTED Final   Salmonella species NOT DETECTED NOT DETECTED Final   Yersinia enterocolitica NOT DETECTED NOT DETECTED Final   Vibrio species NOT DETECTED NOT DETECTED Final   Vibrio cholerae NOT DETECTED NOT DETECTED Final   Enteroaggregative E coli (EAEC) NOT DETECTED NOT DETECTED Final   Enteropathogenic E coli (EPEC) NOT DETECTED NOT DETECTED Final   Enterotoxigenic E coli (ETEC) NOT DETECTED NOT DETECTED Final   Shiga like toxin producing E coli (STEC) NOT DETECTED NOT DETECTED Final   Shigella/Enteroinvasive E coli (EIEC) NOT DETECTED NOT DETECTED Final   Cryptosporidium NOT DETECTED NOT DETECTED Final  Cyclospora cayetanensis NOT DETECTED NOT DETECTED Final   Entamoeba histolytica NOT DETECTED NOT DETECTED Final   Giardia lamblia NOT DETECTED NOT DETECTED Final   Adenovirus F40/41 NOT DETECTED NOT DETECTED Final   Astrovirus NOT DETECTED NOT DETECTED Final   Norovirus GI/GII NOT DETECTED NOT DETECTED Final   Rotavirus A NOT DETECTED NOT DETECTED Final   Sapovirus (I, II, IV, and V) NOT DETECTED NOT DETECTED Final    Comment: Performed at [facility], [address] [place], [place] [address]         Radiology Studies: No results found.       Scheduled Meds:  calcitRIOL   0.25 mcg Per Tube BID   calcium  carbonate (dosed in mg elemental calcium )  2,500 mg of elemental calcium  Per Tube TID BM   Chlorhexidine  Gluconate Cloth  6 each Topical Daily   dexamethasone  (DECADRON ) injection  4 mg Intravenous Q12H   docusate  200 mg Per Tube Daily   enoxaparin  (LOVENOX ) injection  40 mg Subcutaneous Daily   ergocalciferol  (VITAMIN D2)  8,000 Units Per Tube Daily   feeding supplement  1 Container Oral TID WC   feeding supplement (KATE FARMS STANDARD 1.4)  325 mL Oral BID BM   fentaNYL   1 patch Transdermal Q72H   iohexol   500 mL Oral Q1H   magnesium  oxide  400 mg Oral BID    multivitamin with minerals  1 tablet Per Tube Daily   OLANZapine  zydis  5 mg Oral QHS   pantoprazole  (PROTONIX ) IV  40 mg Intravenous Q24H   sodium chloride  flush  10-40 mL Intracatheter Q12H   sorbitol   30 mL Per Tube Q0600   Continuous Infusions:  calcium  gluconate 1,000 mg (04/09/24 0606)   piperacillin -tazobactam (ZOSYN )  IV     piperacillin -tazobactam (ZOSYN )  IV     sodium bicarbonate 150 mEq in sterile water  1,150 mL infusion 75 mL/hr at 04/09/24 0604     LOS: 7 days    Time spent: 55 minutes    Renato Applebaum, MD [facility]

## 2024-04-09 NOTE — Progress Notes (Signed)
 [facility] Spiritual Care Note  Returned voicemail from Ms Rausch. She would like to add a health care proxy to her HCPOA list in her Advance Directives. Placed [facility] chaplain team referral for assistance while Ms Tison is inpatient. Plan to follow up directly with Ms Ebb for spiritual/emotional support later this week, per per request.  Chaplain Vicie Cech, MDiv, [facility] Pager [phone] Voicemail [phone]

## 2024-04-09 NOTE — Progress Notes (Signed)
 Patient was transferred to ICU. Report given to Zoe.

## 2024-04-09 NOTE — Progress Notes (Signed)
 eLink Physician-Brief Progress Note Patient Name: Chelsea Hansen DOB: 14-Feb-1971 MRN: [id number]   Date of Service  04/09/2024  HPI/Events of Note  Mg is elevated but patient is due for Mag Oxide PO and RN is questioning whether or not to hold this dose.  Mg 2.6 from 1.6  eICU Interventions  Ok to hold Mg dose for now     Intervention Category Intermediate Interventions: Electrolyte abnormality - evaluation and management  Damien ONEIDA Grout 04/09/2024, 10:36 PM

## 2024-04-09 NOTE — Significant Event (Signed)
 Patient was given 2.5 L of isotonic fluid, started on IV Zosyn  and first dose given.  Temperature was able to be controlled and less than 100 but patient remained sinus tachycardic.  Heart rate more than 170.  Blood pressures were stable.  She was brought down to ICU, given adenosine 6 mg-then 12 mg with persistent tachycardia.  Blood pressure still stable.  Patient is mentating well.  On room air. On repeat exam.  Patient is mentating well.  Her blood pressure is steadily dropping with systolic blood pressure 84.  MAP of 59.  Patient is tired and fatigued.  Currently denies any pain. Heart rate 130-occasionally 140.  Remains sinus tachycardic.  Temperature 99.   Severe sepsis with septic shock: Resuscitated, currently on 3.5 L isotonic fluids since morning.  Lactic acid normalized. Blood pressures remain low, will start patient on vasopressor.  She has a Port-A-Cath Currently unable to tolerate beta-blockers for rate control. Will start patient on amiodarone  bolus and drip. Case discussed with ICU attending.  Patient is very high risk to decompensate and possibly needs more vasopressor support.

## 2024-04-10 DIAGNOSIS — R000 Tachycardia, unspecified: Secondary | ICD-10-CM | POA: Diagnosis not present

## 2024-04-10 DIAGNOSIS — E440 Moderate protein-calorie malnutrition: Secondary | ICD-10-CM

## 2024-04-10 DIAGNOSIS — Z515 Encounter for palliative care: Secondary | ICD-10-CM

## 2024-04-10 DIAGNOSIS — C50919 Malignant neoplasm of unspecified site of unspecified female breast: Secondary | ICD-10-CM

## 2024-04-10 DIAGNOSIS — E878 Other disorders of electrolyte and fluid balance, not elsewhere classified: Secondary | ICD-10-CM | POA: Diagnosis not present

## 2024-04-10 DIAGNOSIS — C7889 Secondary malignant neoplasm of other digestive organs: Secondary | ICD-10-CM

## 2024-04-10 DIAGNOSIS — Z7189 Other specified counseling: Secondary | ICD-10-CM

## 2024-04-10 DIAGNOSIS — B961 Klebsiella pneumoniae [K. pneumoniae] as the cause of diseases classified elsewhere: Secondary | ICD-10-CM

## 2024-04-10 DIAGNOSIS — I10 Essential (primary) hypertension: Secondary | ICD-10-CM | POA: Diagnosis not present

## 2024-04-10 DIAGNOSIS — R7881 Bacteremia: Secondary | ICD-10-CM

## 2024-04-10 DIAGNOSIS — R579 Shock, unspecified: Secondary | ICD-10-CM | POA: Diagnosis not present

## 2024-04-10 DIAGNOSIS — R4589 Other symptoms and signs involving emotional state: Secondary | ICD-10-CM

## 2024-04-10 DIAGNOSIS — R6521 Severe sepsis with septic shock: Secondary | ICD-10-CM

## 2024-04-10 LAB — BLOOD CULTURE ID PANEL (REFLEXED) - BCID2

## 2024-04-10 LAB — COMPREHENSIVE METABOLIC PANEL WITH GFR
ALT: 29 U/L (ref 0–44)
AST: 27 U/L (ref 15–41)
Albumin: 3 g/dL — ABNORMAL LOW (ref 3.5–5.0)
Alkaline Phosphatase: 470 U/L — ABNORMAL HIGH (ref 38–126)
Anion gap: 10 (ref 5–15)
BUN: 10 mg/dL (ref 6–20)
CO2: 22 mmol/L (ref 22–32)
Calcium: 7 mg/dL — ABNORMAL LOW (ref 8.9–10.3)
Chloride: 95 mmol/L — ABNORMAL LOW (ref 98–111)
Creatinine, Ser: 0.39 mg/dL — ABNORMAL LOW (ref 0.44–1.00)
GFR, Estimated: >60 mL/min (ref >60)
Glucose, Bld: 141 mg/dL — ABNORMAL HIGH (ref 70–99)
Potassium: 3.2 mmol/L — ABNORMAL LOW (ref 3.5–5.1)
Sodium: 127 mmol/L — ABNORMAL LOW (ref 135–145)
Total Bilirubin: 1.1 mg/dL (ref 0.0–1.2)
Total Protein: 5.2 g/dL — ABNORMAL LOW (ref 6.5–8.1)

## 2024-04-10 LAB — CBC WITH DIFFERENTIAL/PLATELET
Abs Immature Granulocytes: 0.23 K/uL — ABNORMAL HIGH (ref 0.00–0.07)
Basophils Absolute: 0 K/uL (ref 0.0–0.1)
Basophils Relative: 0 %
Eosinophils Absolute: 0 K/uL (ref 0.0–0.5)
Eosinophils Relative: 0 %
HCT: 24.1 % — ABNORMAL LOW (ref 36.0–46.0)
Hemoglobin: 7.6 g/dL — ABNORMAL LOW (ref 12.0–15.0)
Immature Granulocytes: 2 %
Lymphocytes Relative: 6 %
Lymphs Abs: 0.6 K/uL — ABNORMAL LOW (ref 0.7–4.0)
MCH: 26 pg (ref 26.0–34.0)
MCHC: 31.5 g/dL (ref 30.0–36.0)
MCV: 82.5 fL (ref 80.0–100.0)
Monocytes Absolute: 0.6 K/uL (ref 0.1–1.0)
Monocytes Relative: 7 %
Neutro Abs: 8.4 K/uL — ABNORMAL HIGH (ref 1.7–7.7)
Neutrophils Relative %: 85 %
Platelets: 63 K/uL — ABNORMAL LOW (ref 150–400)
RBC: 2.92 MIL/uL — ABNORMAL LOW (ref 3.87–5.11)
RDW: 16.8 % — ABNORMAL HIGH (ref 11.5–15.5)
Smear Review: NORMAL
WBC: 9.8 K/uL (ref 4.0–10.5)
nRBC: 0.2 % (ref 0.0–0.2)

## 2024-04-10 LAB — PHOSPHORUS: Phosphorus: 2.1 mg/dL — ABNORMAL LOW (ref 2.5–4.6)

## 2024-04-10 LAB — MAGNESIUM: Magnesium: 2.1 mg/dL (ref 1.7–2.4)

## 2024-04-10 MED ORDER — ACETAMINOPHEN 10 MG/ML IV SOLN
1000.0000 mg | Freq: Four times a day (QID) | INTRAVENOUS | Status: AC
  Administered 2024-04-10 – 2024-04-11 (×4): 1000 mg via INTRAVENOUS
  Filled 2024-04-10 (×4): qty 100

## 2024-04-10 MED ORDER — ACETAMINOPHEN 325 MG PO TABS
650.0000 mg | ORAL_TABLET | Freq: Four times a day (QID) | ORAL | Status: DC | PRN
  Administered 2024-04-12: 650 mg via ORAL
  Filled 2024-04-10: qty 2

## 2024-04-10 MED ORDER — LORAZEPAM 2 MG/ML IJ SOLN
1.0000 mg | Freq: Once | INTRAMUSCULAR | Status: AC
Start: 2024-04-10 — End: 2024-04-10
  Administered 2024-04-10: 1 mg via INTRAVENOUS
  Filled 2024-04-10: qty 1

## 2024-04-10 MED ORDER — METRONIDAZOLE 500 MG/100ML IV SOLN
500.0000 mg | Freq: Two times a day (BID) | INTRAVENOUS | Status: DC
  Administered 2024-04-10 – 2024-04-12 (×4): 500 mg via INTRAVENOUS
  Filled 2024-04-10 (×4): qty 100

## 2024-04-10 MED ORDER — CEFTRIAXONE SODIUM 2 G IJ SOLR
2.0000 g | INTRAMUSCULAR | Status: DC
  Administered 2024-04-10 – 2024-04-12 (×3): 2 g via INTRAVENOUS
  Filled 2024-04-10 (×3): qty 20

## 2024-04-10 MED ORDER — POTASSIUM CHLORIDE 20 MEQ PO PACK
20.0000 meq | PACK | Freq: Two times a day (BID) | ORAL | Status: AC
  Administered 2024-04-10 – 2024-04-11 (×3): 20 meq
  Filled 2024-04-10 (×3): qty 1

## 2024-04-10 MED ORDER — SODIUM CHLORIDE 0.9 % IV SOLN
45.0000 mmol | Freq: Once | INTRAVENOUS | Status: AC
  Administered 2024-04-10: 45 mmol via INTRAVENOUS
  Filled 2024-04-10: qty 15

## 2024-04-10 NOTE — Progress Notes (Signed)
   04/10/24 1501  Spiritual Encounters  Type of Visit Follow up  Care provided to: Patient  Reason for visit Urgent spiritual support  OnCall Visit No   I returned to revisit with Ms Heckert.

## 2024-04-10 NOTE — Progress Notes (Signed)
 Patient ID: Chelsea Hansen, female   DOB: [birth year], 53 y.o.   MRN: [id number] [facility] Progress Note   Assessment/ Plan:   1.  Hypocalcemia: Secondary to recent denosumab  use in a patient with advanced breast cancer and metastasis to bones/peritoneum.  Continue aggressive calcium  replacement along with vitamin D  and calcitriol  to help hasten absorption.  She will need supplemental therapy until effects of denosumab  wear off on bone turnover.   2.  Supraventricular tachycardia: Transferred to ICU and being treated with adenosine.  Appears hemodynamically stable. 3.  Hypomagnesemia:  Appears to now be replete s/p intravenous and oral magnesium  supplementation. 4.  Hypophosphatemia: Continue oral phosphorus supplementation to complement recent intravenous supplement. 5.  Hyponatremia: Possibly related to recent SVT event/hypotension. Switch from LR to NS and replace potassium to preserve osmolality.   Subjective:   Improved heart rate and hemodynamic status overnight.    Objective:   BP 94/63 (BP Location: Right Arm)   Pulse (!) 59   Temp 97.9 F (36.6 C) (Oral)   Resp 15   Ht 5' 11 (1.803 m)   Wt 74.7 kg   SpO2 98%   BMI 22.97 kg/m   Intake/Output Summary (Last 24 hours) at 04/10/2024 1301 Last data filed at 04/10/2024 1130 Gross per 24 hour  Intake 9425.53 ml  Output 375 ml  Net 9050.53 ml   Weight change:   Physical Exam: Gen: Chronically ill-appearing woman who is resting in bed, responds to questions. Family at bedside.  CVS: Regular tachycardia, S1 and S2 normal without murmur Resp: Diminished breath sounds over bases, poor inspiratory effort.  No rales/rhonchi Abd: Soft, flat, G-tube in situ with some tenderness around it.  Colostomy bag in place. Ext: No lower extremity edema  Imaging: CT CHEST ABDOMEN PELVIS W CONTRAST Result Date: 04/09/2024 EXAM: CT CHEST, ABDOMEN AND PELVIS WITH CONTRAST 04/09/2024 12:41:00 PM TECHNIQUE: CT of the chest,  abdomen and pelvis was performed with the administration of 100 mL of iohexol  (OMNIPAQUE ) 300 MG/ML solution. Multiplanar reformatted images are provided for review. Automated exposure control, iterative reconstruction, and/or weight based adjustment of the mA/kV was utilized to reduce the radiation dose to as low as reasonably achievable. COMPARISON: CT abdomen and pelvis 04/01/2024. CLINICAL HISTORY: Abdominal abscess (Ped 0-17y); metastatic cancer, now with fever. Suspect abdominal wall abscess. Exam time extended due to patient transport by CT staff. FINDINGS: CHEST: MEDIASTINUM AND LYMPH NODES: Right chest wall access port-a-cath with tip terminating in the right atrium. Heart and pericardium are unremarkable. The central airways are clear. No mediastinal, hilar or axillary lymphadenopathy. LUNGS AND PLEURA: Limited evaluation of the lungs due to motion artifact. No central pulmonary embolus with limited evaluation more distally due to motion artifact and timing of contrast. No focal consolidation or pulmonary edema. No pleural effusion or pneumothorax. ABDOMEN AND PELVIS: LIMITATIONS: Limited evaluation of the abdomen and pelvis due to motion artifact. LIVER: The liver is enlarged, measuring up to 21 cm. GALLBLADDER AND BILE DUCTS: Gallbladder is unremarkable. No biliary ductal dilatation. SPLEEN: No acute abnormality. PANCREAS: No acute abnormality. ADRENAL GLANDS: No acute abnormality. KIDNEYS, URETERS AND BLADDER: No stones in the kidneys or ureters. No hydronephrosis. No perinephric or periureteral stranding. Urinary bladder is unremarkable. No filling defects of the partially visualized collecting systems on delayed imaging. GI AND BOWEL: Question left lower quadrant ileostomy. Small hiatal hernia. Gastrostomy tube in appropriate position with tip and inflated balloon within the gastric lumen. Question interval development of pyloric and first portion of  the duodenum trace bowel wall thickening and slight  irregularity with markedly limited evaluation due to motion artifact. Colonic diverticulosis. No large bowel wall thickening or dilatation. The appendix is unremarkable. No small bowel dilatation. There is no bowel obstruction. REPRODUCTIVE ORGANS: Status post hysterectomy. No adnexal mass. 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 No ascites. No free air. VASCULATURE: Aorta is normal in caliber. ABDOMINAL AND PELVIS LYMPH NODES: No lymphadenopathy. BONES AND SOFT TISSUES: Right breast lumpectomy. Questions mild dermal thickening and edema of the left breast (2.58). Diffuse mild-appearing soft tissue edema the the lower abdominal wall . Resolution of surrounding inflammatory changes along the left lateral abdominal wall musculature. Diffuse sclerotic changes metastatic lesions of the appendicular and axial skeleton. Chronic stable L3 fracture. No acute pathologic fracture identified. No focal soft tissue abnormality. IMPRESSION: 1. Questions mild dermal thickening and edema of the left breast. Recommend correlation with physical exam. 2. Question interval development of pyloric and first portion of the duodenum trace bowel wall thickening and slight irregularity with markedly limited evaluation due to motion artifact. This may represent artifact. 3. 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 4. Diffuse sclerotic changes metastatic lesions of the appendicular and axial skeleton. 5. Colonic diverticulosis with no acute diverticulitis. Electronically signed by: Kate Plummer MD 04/09/2024 09:16 PM EDT RP Workstation: [id number]    Labs: BMET Recent Labs  Lab 04/04/24 0255 04/04/24 1808 04/05/24 0014 04/05/24 1748 04/06/24 9565 04/07/24 0348 04/08/24 0240 04/09/24 0309 04/10/24 0717  NA 137   < > 134* 135 137 136 136 132* 127*  K 4.1   < > 4.7 4.7 3.9 3.9 3.7 3.6 3.2*  CL 106   < > 103 102 105 105 102 98 95*  CO2 22   < > 23 23 24 22 23 24 22    GLUCOSE 78   < > 160* 130* 93 136* 107* 118* 141*  BUN <5*   < > <5* <5* <5* 5* 5* 8 10  CREATININE <0.30*   < > <0.30* 0.34* <0.30* 0.37* 0.35* 0.37* 0.39*  CALCIUM  5.3*   < > 6.5* 6.7* 6.0* 6.8*  6.9* 7.0* 6.8* 7.0*  PHOS 2.2*  --  1.9*  --  1.8* 1.8* 1.7* 1.7* 2.1*   < > = values in this interval not displayed.   CBC Recent Labs  Lab 04/07/24 1745 04/08/24 0248 04/09/24 0309 04/10/24 0910  WBC 5.8 6.4 4.9 9.8  NEUTROABS  --   --   --  8.4*  HGB 8.5* 8.9* 8.5* 7.6*  HCT 26.8* 27.6* 25.6* 24.1*  MCV 83.5 82.4 82.3 82.5  PLT 165 169 122* 63*    Medications:     calcitRIOL   0.25 mcg Per Tube BID   calcium  carbonate (dosed in mg elemental calcium )  2,500 mg of elemental calcium  Per Tube TID BM   Chlorhexidine  Gluconate Cloth  6 each Topical Daily   docusate  200 mg Per Tube Daily   enoxaparin  (LOVENOX ) injection  40 mg Subcutaneous Daily   ergocalciferol  (VITAMIN D2)  8,000 Units Per Tube Daily   feeding supplement (KATE FARMS STANDARD 1.4)  325 mL Oral BID BM   fentaNYL   1 patch Transdermal Q72H   hydrocortisone sod succinate (SOLU-CORTEF) inj  100 mg Intravenous Q8H   magnesium  oxide  400 mg Per Tube BID   multivitamin with minerals  1 tablet Per Tube Daily   OLANZapine  zydis  5 mg Oral QHS   pantoprazole  (PROTONIX ) IV  40 mg  Intravenous Q24H   phosphorus  250 mg Per Tube TID   potassium chloride   20 mEq Per Tube BID   sodium chloride  flush  10-40 mL Intracatheter Q12H   sorbitol   30 mL Per Tube Q0600    Gordy Blanch, MD 04/10/2024, 1:01 PM

## 2024-04-10 NOTE — Progress Notes (Signed)
 eLink Physician-Brief Progress Note Patient Name: Chelsea Hansen DOB: Nov 22, 1970 MRN: [id number]   Date of Service  04/10/2024  HPI/Events of Note  Notified by pharm that blood culture positive for Klebsiella On vancomycin  and piperacillin -tazobactam  eICU Interventions  Ok to discontinue vancomycin  Will defer to bedside rounding team on further de-escalation of antibiotics as deemed appropriate     Intervention Category Major Interventions: Infection - evaluation and management  Damien ONEIDA Grout 04/10/2024, 1:34 AM

## 2024-04-10 NOTE — Progress Notes (Signed)
   04/10/24 1325  Spiritual Encounters  Type of Visit Follow up  Care provided to: Pt and family  Reason for visit Urgent spiritual support  OnCall Visit No   Returned to patient's room in response to page, to provide support to patient and her patients after patient received updated information. Provided spiritual care and advised that chaplain would return as the flow of traffic became busy. (Various doctors entered with pressing information, chaplain waited with patient and family until palliative care team member arrived.) Patient has changed her mind and wants to change the HCPOA to have the two person, with her daughter primary. Also patient stressed that she wants all life saving measures taken on her behalf. Explained that chaplain will return with forms and to continue conversation and prayer.

## 2024-04-10 NOTE — Progress Notes (Signed)
   04/10/24 1025  Spiritual Encounters  Type of Visit Follow up  Care provided to: Patient  Reason for visit Advance directives  OnCall Visit No   Visited patient in reference to changing HCPOA. Patient wants additional POA added to current list, however, there is no option for a third person and patient does not want to remove any of the current POA. She also wants her other daughter to be primary however she doesn't want to change the order on paper or in the computer. Unsure how we can officially honor her request if her daughter is not on the HCPOA. She does not want to redo POA but wants her daughter to be primary go to person.

## 2024-04-10 NOTE — Progress Notes (Signed)
 NAME:  Chelsea Hansen, MRN:  [id number], DOB:  [birth year], LOS: 8 ADMISSION DATE:  04/01/2024 CONSULTATION DATE:  04/09/2024 REFERRING MD:  Raenelle - [facility], CHIEF COMPLAINT:  Hypotension, SVT   History of Present Illness:  53 year old woman who presented to [facility] 10/12 for n/v/d and electrolyte derangements. 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 Recent admission 9/29-10/8 for similar.  Initial admission 10/12 for n/v/d and electrolyte derangements; admitted to [facility]. Nephrology consulted for assistance with management of electrolyte abnormalities. Oncology following. PMT following for GOC and assistance with chronic pain management.  On 10/20, patient was noted to be febrile to 102F with abdominal pain (acute-on-chronic) and leaking G-tube. Patient was noted to be hypotensive and 2L fluid bolus was administered and broad-spectrum antibiotics (Zosyn ) started. PCT 9.11, LA 3.1, improved to 1.0 after fluids. Transferred to SDU/ICU. Patient was noted to be in SVT with HR 170s; adenosine 6mg  then 12mg  administered with persistent tachycardia. Patient became progressively hypotensive with SBP 80s and MAP 50s prompting initiation of Levophed. Repeat CT A/P pending.  PCCM consulted for ICU evaluation and management.  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 Lynwood Nasuti   Hyperlipidemia    on meds   Hypertension    on meds   Peritoneal carcinomatosis (HCC)    S/P percutaneous endoscopic gastrostomy (PEG) tube placement [facility])    SVT (supraventricular tachycardia)    Significant Hospital Events: Including procedures, antibiotic start and stop dates in addition to other pertinent events   10/20 - SVT with HR 170s on floor, not responsive to adenosine. PCCM consulted for ICU evaluation and management. Levophed + vasopressin initiated. 10/21 - Vasopressor requirements improved, off of Levo. Ongoing vaso. Onc/PMT consulting.  Interim History / Subjective:  No significant events Transferred from floor 10/20 afternoon Placed on vasopressors, now off of Levophed/on vaso Feeling better overall, more rested, no leaking from PEG BCID +klebsiella, narrowed to ceftriaxone /Flagyl  PMT/Oncology meeting with patient today  Objective:  Blood pressure 116/74, pulse 67, temperature 97.9 F (36.6 C), temperature source Oral, resp. rate 15, height 5' 11 (1.803 m), weight 74.7 kg, SpO2 99%.        Intake/Output Summary (Last 24 hours) at 04/10/2024 0727 Last data filed at 04/10/2024 0630 Gross per 24 hour  Intake 9465.53 ml  Output 375 ml  Net 9090.53 ml   Filed Weights   04/07/24 0504 04/08/24 0514 04/10/24 0323  Weight: 75.1 kg 74.5 kg 74.7 kg   Physical Examination: General: Chronically ill-appearing middle-aged  woman in NAD. More awake/talkative today. HEENT: [place]/AT, anicteric sclera, PERRL, moist mucous membranes. Neuro: Awake, oriented x 4. Responds to verbal stimuli. Following commands consistently. Moves all 4 extremities  spontaneously. CV: RRR, no m/g/r. Port to R chest. PULM: Breathing even and unlabored on RA. Lung fields CTAB. GI: Soft, mildly distended, diffuse mild TTP over abdomen; LUQ PEG without drainage, LLQ wound with serous drainage/soft yellow debris. Normoactive bowel sounds. Extremities: No LE edema noted. Skin: Warm/dry, no rashes.  Resolved Hospital Problem List:  Lactic acidosis  Assessment & Plan:  Undifferentiated shock, presume septic; improving Klebsiella bacteremia CT Chest/A/P 10/21 with mild dermal thickening/edemaof L breast, pyloric/duodenal trace bowel wall thickening, known omental carcinomatosis, diffuse sclerotic metastatic skeletal lesions. - Goal MAP > 65 - Fluid resuscitation as tolerated - Off of Levophed, continue vasopressin 0.03 unit/min - SDS (hydrocortisone 100mg  Q8H) - Trend WBC, fever curve - F/u Cx data - Continue broad-spectrum antibiotics (narrowed to ceftriaxone /Flagyl )  Tachycardia, resolved History of SVT S/p adenosine 6mg , 12mg . 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 - Oncology following (Dr. 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 - PEG tube care per protocol - Nutrition/RD following - PPI - Bowel regimen - WOC  Multiple electrolyte abnormalities Hypocalcemia Hypokalemia Hypomagnesemia Hypophosphatemia - Nephrology following, appreciate assistance - Trend BMP - Replete electrolytes as indicated - Monitor I&Os - Avoid nephrotoxic agents as able - Ensure adequate renal perfusion  Anxiety/depression - Continue Zyprexa   Critical care time:    The patient is critically ill with multiple organ system failure and requires high complexity decision making for assessment and support, frequent evaluation and titration of therapies, advanced monitoring, review of radiographic studies and interpretation of complex data.   Critical Care Time devoted to patient care services, exclusive of separately billable procedures, described in this note is 37 minutes.  Corean CHRISTELLA Kethan Papadopoulos, PA-C [facility] 04/10/24 7:27 AM  Please see [email or web address] for pager details.  From 7A-7P if no response, please call [phone] After hours, please call ELink [phone]

## 2024-04-10 NOTE — Plan of Care (Signed)
  Problem: Education: Goal: Knowledge of General Education information will improve Description: Including pain rating scale, medication(s)/side effects and non-pharmacologic comfort measures Outcome: Progressing   Problem: Clinical Measurements: Goal: Will remain free from infection Outcome: Progressing Goal: Diagnostic test results will improve Outcome: Progressing   Problem: Nutrition: Goal: Adequate nutrition will be maintained Outcome: Progressing   Problem: Coping: Goal: Level of anxiety will decrease Outcome: Progressing   Problem: Pain Managment: Goal: General experience of comfort will improve and/or be controlled Outcome: Progressing

## 2024-04-10 NOTE — Progress Notes (Signed)
 PHARMACY - PHYSICIAN COMMUNICATION CRITICAL VALUE ALERT - BLOOD CULTURE IDENTIFICATION (BCID)  Chelsea Hansen is an 53 y.o. female who presented to [facility] on 04/01/2024 with a chief complaint of abdominal pain.   Assessment:   Admit with sepsis- possible sources of infection include intra-abdominal or indwelling line infection.  Currently afebrile, WBC WNL but remains on vasopressors, amiodarone.  4/4 Blood cx bottles growing GNR; BCID Klebsiella pneumoniae, no resistance.   Name of physician (or Provider) Contacted: [place]  Current antibiotics: Vancomycin  + Zosyn   Changes to prescribed antibiotics recommended:  D/C Vancomycin  Continue Zosyn  for now; consider de-escalate to Rocephin  once pt hemodynamically stable.   Results for orders placed or performed during the hospital encounter of 04/01/24  Blood Culture ID Panel (Reflexed) (Collected: 04/09/2024  9:01 AM)  Result Value Ref Range   Enterococcus faecalis NOT DETECTED NOT DETECTED   Enterococcus Faecium NOT DETECTED NOT DETECTED   Listeria monocytogenes NOT DETECTED NOT DETECTED   Staphylococcus species NOT DETECTED NOT DETECTED   Staphylococcus aureus (BCID) NOT DETECTED NOT DETECTED   Staphylococcus epidermidis NOT DETECTED NOT DETECTED   Staphylococcus lugdunensis NOT DETECTED NOT DETECTED   Streptococcus species NOT DETECTED NOT DETECTED   Streptococcus agalactiae NOT DETECTED NOT DETECTED   Streptococcus pneumoniae NOT DETECTED NOT DETECTED   Streptococcus pyogenes NOT DETECTED NOT DETECTED   A.calcoaceticus-baumannii NOT DETECTED NOT DETECTED   Bacteroides fragilis NOT DETECTED NOT DETECTED   Enterobacterales DETECTED (A) NOT DETECTED   Enterobacter cloacae complex NOT DETECTED NOT DETECTED   Escherichia coli NOT DETECTED NOT DETECTED   Klebsiella aerogenes NOT DETECTED NOT DETECTED   Klebsiella oxytoca NOT DETECTED NOT DETECTED   Klebsiella pneumoniae DETECTED (A) NOT DETECTED   Proteus species NOT DETECTED  NOT DETECTED   Salmonella species NOT DETECTED NOT DETECTED   Serratia marcescens NOT DETECTED NOT DETECTED   Haemophilus influenzae NOT DETECTED NOT DETECTED   Neisseria meningitidis NOT DETECTED NOT DETECTED   Pseudomonas aeruginosa NOT DETECTED NOT DETECTED   Stenotrophomonas maltophilia NOT DETECTED NOT DETECTED   Candida albicans NOT DETECTED NOT DETECTED   Candida auris NOT DETECTED NOT DETECTED   Candida glabrata NOT DETECTED NOT DETECTED   Candida krusei NOT DETECTED NOT DETECTED   Candida parapsilosis NOT DETECTED NOT DETECTED   Candida tropicalis NOT DETECTED NOT DETECTED   Cryptococcus neoformans/gattii NOT DETECTED NOT DETECTED   CTX-M ESBL NOT DETECTED NOT DETECTED   Carbapenem resistance IMP NOT DETECTED NOT DETECTED   Carbapenem resistance KPC NOT DETECTED NOT DETECTED   Carbapenem resistance NDM NOT DETECTED NOT DETECTED   Carbapenem resist OXA 48 LIKE NOT DETECTED NOT DETECTED   Carbapenem resistance VIM NOT DETECTED NOT DETECTED    Rosaline Millet PharmD 04/10/2024  1:19 AM

## 2024-04-10 NOTE — Progress Notes (Signed)
 Chelsea Hansen [address] [facility] liaison note:   This is a current patient with [facility], followed for outpatient palliative care.   [facility] will continue to follow for discharge disposition.   Please call with any outpatient palliative or hospice related questions or concerns.   Thank you, Eleanor Nail, LPN [phone]

## 2024-04-10 NOTE — Progress Notes (Signed)
 Nutrition Follow-up  DOCUMENTATION CODES:   Non-severe (moderate) malnutrition in context of chronic illness  INTERVENTION:  - Regular diet.  [employer] 1.4 PO BID, each supplement provides 455 kcal and 20 grams protein. -Magic cup BID with meals, each supplement provides 290 kcal and 9 grams of protein  -Multivitamin with minerals daily via tube   NUTRITION DIAGNOSIS:   Moderate Malnutrition related to chronic illness, cancer and cancer related treatments as evidenced by severe muscle depletion, moderate fat depletion. *ongoing  GOAL:   Patient will meet greater than or equal to 90% of their needs *not met  MONITOR:   PO intake, Supplement acceptance  REASON FOR ASSESSMENT:   Consult Assessment of nutrition requirement/status, Enteral/tube feeding initiation and management  ASSESSMENT:   53 year old with metastastic breast cancer to bone, peritoneum, pancreas with duodenal obstruction s/p G tube who was admitted on 10/12 for nausea, vomiting, diarrhea, fall, and electrolyte abnormalities.  Patient sleeping at time of visit but awoke to sound of voice. When asked how she has been eating patient reports she has been eating everything. She is documented to be consuming 0-90% of meals, average of 56%. However, only 6 meals documented over the past week so true intent difficult to assess.   Patient is documented to be intermittently accepting supplements. Usually accepting 0-2 [employer] daily and 1-2 [employer] daily.  However, when asked how patient likes these supplements she fell back asleep and no longer awoke to sound of voice.   Discussed with RN. RN reports patient stated she doesn't like boost breeze and doesn't want to receive it. Unsure about the [facility] however patient is noted to have accepted 2 today so far. Will discontinue Boost Breeze but continue [employer] and [employer] to support intake.  Consulted for tube feed initiation and management. Patient's  G-tube is for venting and patient has had difficulty in the past tolerating tube feeds.   Discussed with CCM. Confirmed no aggressive nutrition interventions at this time as Oncology saw patient today and recommending no further chemo due to progression and poor tolerance. Palliative care following patient and are recommending DNR/DNI due to terminal nature of patient's advanced breast cancer with mets to bone, pancreas, peritoneum.   Admit weight: 166# Current weight: 164# I&O's: +22L since admit  Medications reviewed and include: Colace, 8000 units vitamin D , MVI, K PHOS  TID  Labs reviewed:  Na 127 K+ 3.2 Phosphorus 2.1   Diet Order:   Diet Order             Diet regular Room service appropriate? Yes; Fluid consistency: Thin  Diet effective now                   EDUCATION NEEDS:  Education needs have been addressed  Skin:  Skin Assessment: Skin Integrity Issues: Skin Integrity Issues:: Stage II Stage II: sacrum  Last BM:  10/20  Height:  Ht Readings from Last 1 Encounters:  04/01/24 5' 11 (1.803 m)   Weight:  Wt Readings from Last 1 Encounters:  04/10/24 74.7 kg   BMI:  Body mass index is 22.97 kg/m.  Estimated Nutritional Needs:  Kcal:  2200-2400 Protein:  110-125g Fluid:  2.2L/day    Trude Ned RD, LDN Contact via Secure Chat.

## 2024-04-10 NOTE — Progress Notes (Signed)
 Daily Progress Note   Patient Name: Chelsea Hansen       Date: 04/10/2024 DOB: 07-13-1970  Age: 53 y.o. MRN#: [id number] Attending Physician: Annella Donnice SAUNDERS, MD Primary Care Physician: [name], NP Admit Date: 04/01/2024 Length of Stay: 8 days  Reason for Consultation/Follow-up: Establishing goals of care  Subjective:   Extensive review of EMR including recent documentation from hospitalist, oncologist, chaplain, and nephrologist.  Discussed care with bedside RN for medical updates.  Was able to present to bedside once informed patient's parents were present and oncologist had already had a chance to discuss with patient no longer being a candidate for cancer directed therapies.  Presented to bedside to see patient.  Chaplain present at bedside at time of visit though excused herself so this provider could discuss pathways for medical care moving forward.  Introduced myself as a member of the palliative medicine team.  Patient appropriately tearful and anxious.  Empathized with difficult situation.  Patient was able to introduce her parents at bedside.  They acknowledged that oncologist had discussed patient is not a candidate for cancer directed therapies.  Spent time answering questions as able regarding this.  Noted how functional status and comorbidities can play into cancer therapies and that giving cancer therapies can shorten survival if given inappropriately.  Tried to answer questions as able regarding this though acknowledge that patient is in a difficult emotional state at this time to process all information.  Patient and family inquiring about support moving forward.  Did introduce the concept of hospice support at home and what this would and would not entail.  Explained philosophy of hospice with focusing on comfort at end-of-life with anticipated prognosis of 6 months or less if continues on current medical trajectory.  Spent time answering questions as able  regarding this.  Patient's mother noted would consider and inform team if they decide to pursue hospice support.   With permission, also able to discuss CODE STATUS.  At this time patient remains full code.  Expressed concern that with patient's underlying metastatic cancer, if patient were sick enough her heart were to stop or she were to stop breathing, intervention such as cardiac resuscitation and intubation with mechanical ventilation would not lead to good quality of life outcomes and would not reverse patient's underlying metastatic cancer which will continue to progress.  Discussed that if patient remains full code, would fall to her children to make decisions about palliative extubation if ended up on life support machines.  Encouraged patient to consider change of CODE STATUS to DNR/DNI, continuing appropriate medical interventions at this time.  Patient and family to further consider and discuss.  At this time they are going to talk to other family members and process information that patient has received today.  Acknowledged this and answered questions as able to assist with conversations moving forward.  Noted palliative medicine team would continue to follow along with patient's journey.  Discussed care with team including oncologist, chaplain, bedside RN, PCCM provider, and outpatient PMT after visit.  Objective:   Vital Signs:  BP 92/68   Pulse (!) 58   Temp 97.9 F (36.6 C) (Oral)   Resp 14   Ht 5' 11 (1.803 m)   Wt 74.7 kg   SpO2 99%   BMI 22.97 kg/m   Physical Exam: General: NAD, alert, tearful, chronically ill-appearing Cardiovascular: Tachycardia noted Respiratory: Increased increased work of breathing noted, not in respiratory distress Neuro: A&Ox4, following commands easily Psych: Tearful, anxious  Assessment & Plan:   Assessment: Patient is a 53 year old female with a past medical history of metastatic breast cancer with disease to bone/pancreas/peritoneum and  complicated by duodenal obstruction requiring PEG tube placement, hypertension, hyperlipidemia, SVT, DVT, asthma, and anxiety/depression who was admitted on 04/01/2024 for nausea and vomiting.  Patient had recently been admitted from 9/29-10/8 for management as well.  During admission, patient required transfer to ICU for management of presumed septic shock in the setting of Klebsiella bacteremia, tachycardia with history of SVT, and intestinal obstruction.  Nephrology and oncology consulted for recommendations during hospitalization.  Palliative medicine team consulted to assist with complex medical decision making and symptom management.   Of note patient known to PMT from outpatient visits.  Recommendations/Plan: # Complex medical decision making/goals of care:  - Extensive discussion with patient while her parents were at bedside as detailed above in HPI.  Has been discussed by oncologist that patient is not a candidate for further cancer directed therapies due to overall functional status and multiple medical comorbidities.  Was explained that any additional chemotherapy treatments would not offer survival advantage, would in fact likely decrease survival.  Patient having difficulty processing this information.  Was able to introduce idea of hospice support at home and what would and would not be provided.  Patient does have difficult home situation with lack of stable housing and support for care at home.  At this time patient processing information and parents assisting with this and discussing care with other family members to update that patient is not a candidate for further cancer directed therapies.  Palliative medicine team continuing to engage in conversations as able and appropriate moving forward.  -  Code Status: Full Code   - Discussed CODE STATUS with patient while her parents were at bedside as detailed above in HPI.  Expressed concern that if patient were sick enough her heart were to  stop or she would have stopped breathing, interventions such as cardiac resuscitation and intubation with mechanical ventilation would not lead to good quality of life outcomes and would not reverse her underlying metastatic cancer.  Recommended patient consider change of CODE STATUS to DNR/DNI.  Discussed that if patient does remain full code, would fall to her children to make decision about palliative extubation in setting of metastatic cancer at end-of-life.  # Psychosocial Support:  - Patient's parents, 2 sons, 2 daughters listed in EMR  # Discharge Planning: To Be Determined  Discussed with: Patient, patient's parents at bedside, bedside RN, chaplain, hospitalist, oncologist, PCCM providers  Thank you for allowing the palliative care team to participate in the care Chelsea Hansen.  Tinnie Radar, DO Palliative Care Provider PMT # [phone]  If patient remains symptomatic despite maximum doses, please call PMT at [phone] between 0700 and 1900. Outside of these hours, please call attending, as PMT does not have night coverage.  Personally spent 55 minutes in patient care including extensive chart review (labs, imaging, progress/consult notes, vital signs), medically appropraite exam, discussed with treatment team, education to patient, family, and staff, documenting clinical information, medication review and management, coordination of care, and available advanced directive documents.

## 2024-04-10 NOTE — Progress Notes (Signed)
 Oncology  S: Patient was seen in her bed with her mother and father at bedside.  She appeared to be slightly sleepy but was awake and alert and answer all questions appropriately.     04/10/2024   10:00 AM 04/10/2024    9:00 AM 04/10/2024    8:00 AM  Vitals with BMI  Systolic 94 96 92  Diastolic 63 60 68  Pulse 59 54 58       Latest Ref Rng & Units 04/10/2024    9:10 AM 04/09/2024    3:09 AM 04/08/2024    2:48 AM  CBC  WBC 4.0 - 10.5 K/uL 9.8  4.9  6.4   Hemoglobin 12.0 - 15.0 g/dL 7.6  8.5  8.9   Hematocrit 36.0 - 46.0 % 24.1  25.6  27.6   Platelets 150 - 400 K/uL 63  122  169       Latest Ref Rng & Units 04/10/2024    7:17 AM 04/09/2024    3:09 AM 04/08/2024    2:40 AM  CMP  Glucose 70 - 99 mg/dL 858  881  892   BUN 6 - 20 mg/dL 10  8  5    Creatinine 0.44 - 1.00 mg/dL 9.60  9.62  9.64   Sodium 135 - 145 mmol/L 127  132  136   Potassium 3.5 - 5.1 mmol/L 3.2  3.6  3.7   Chloride 98 - 111 mmol/L 95  98  102   CO2 22 - 32 mmol/L 22  24  23    Calcium  8.9 - 10.3 mg/dL 7.0  6.8  7.0   Total Protein 6.5 - 8.1 g/dL 5.2     Total Bilirubin 0.0 - 1.2 mg/dL 1.1     Alkaline Phos 38 - 126 U/L 470     AST 15 - 41 U/L 27     ALT 0 - 44 U/L 29      Assessment and plan: Metastatic breast cancer with peritoneal carcinomatosis and bone metastases: Previously was receiving treatment with Enhertu .   Goals of care discussion: I discussed with her that her overall performance status and her co morbidities have gotten worse and therefore additional chemotherapy treatments are felt to be futile and may not offer her much survival advantage.  In fact they can lower her decrease the survival. Patient was in tears hearing that I did not intend to give her any more chemotherapy.  I also met with her father and mother outside the room and counseled them. Multiple health issues including SVT, severe hypocalcemia that may not recover anytime soon, peritoneal carcinomatosis with multiple bowel  issues including being at high risk for abdominal sepsis  The parents are in agreement but felt that Miia is nowhere near accepting that her life is short.

## 2024-04-11 ENCOUNTER — Encounter: Payer: Self-pay | Admitting: Licensed Clinical Social Worker

## 2024-04-11 ENCOUNTER — Other Ambulatory Visit: Payer: Self-pay | Admitting: Licensed Clinical Social Worker

## 2024-04-11 ENCOUNTER — Encounter: Payer: Self-pay | Admitting: General Practice

## 2024-04-11 DIAGNOSIS — Z515 Encounter for palliative care: Secondary | ICD-10-CM | POA: Diagnosis not present

## 2024-04-11 DIAGNOSIS — E878 Other disorders of electrolyte and fluid balance, not elsewhere classified: Secondary | ICD-10-CM | POA: Diagnosis not present

## 2024-04-11 DIAGNOSIS — Z79899 Other long term (current) drug therapy: Secondary | ICD-10-CM

## 2024-04-11 DIAGNOSIS — R4589 Other symptoms and signs involving emotional state: Secondary | ICD-10-CM | POA: Diagnosis not present

## 2024-04-11 DIAGNOSIS — E440 Moderate protein-calorie malnutrition: Secondary | ICD-10-CM | POA: Diagnosis not present

## 2024-04-11 DIAGNOSIS — I10 Essential (primary) hypertension: Secondary | ICD-10-CM | POA: Diagnosis not present

## 2024-04-11 DIAGNOSIS — R000 Tachycardia, unspecified: Secondary | ICD-10-CM | POA: Diagnosis not present

## 2024-04-11 DIAGNOSIS — Z7189 Other specified counseling: Secondary | ICD-10-CM | POA: Diagnosis not present

## 2024-04-11 DIAGNOSIS — R579 Shock, unspecified: Secondary | ICD-10-CM | POA: Diagnosis not present

## 2024-04-11 LAB — CULTURE, BLOOD (ROUTINE X 2): Special Requests: ADEQUATE

## 2024-04-11 LAB — CBC WITH DIFFERENTIAL/PLATELET
Abs Immature Granulocytes: 0.3 K/uL — ABNORMAL HIGH (ref 0.00–0.07)
Band Neutrophils: 8 %
Basophils Absolute: 0 K/uL (ref 0.0–0.1)
Basophils Relative: 0 %
Eosinophils Absolute: 0 K/uL (ref 0.0–0.5)
Eosinophils Relative: 0 %
HCT: 19.4 % — ABNORMAL LOW (ref 36.0–46.0)
Hemoglobin: 6.3 g/dL — CL (ref 12.0–15.0)
Lymphocytes Relative: 1 %
Lymphs Abs: 0.1 K/uL — ABNORMAL LOW (ref 0.7–4.0)
MCH: 26.3 pg (ref 26.0–34.0)
MCHC: 32.5 g/dL (ref 30.0–36.0)
MCV: 80.8 fL (ref 80.0–100.0)
Metamyelocytes Relative: 3 %
Monocytes Absolute: 0.4 K/uL (ref 0.1–1.0)
Monocytes Relative: 4 %
Neutro Abs: 9 K/uL — ABNORMAL HIGH (ref 1.7–7.7)
Neutrophils Relative %: 84 %
Platelets: 40 K/uL — ABNORMAL LOW (ref 150–400)
RBC: 2.4 MIL/uL — ABNORMAL LOW (ref 3.87–5.11)
RDW: 16.6 % — ABNORMAL HIGH (ref 11.5–15.5)
Smear Review: NORMAL
WBC: 9.8 K/uL (ref 4.0–10.5)
nRBC: 0.2 % (ref 0.0–0.2)

## 2024-04-11 LAB — OSMOLALITY: Osmolality: 272 mosm/kg — ABNORMAL LOW (ref 275–295)

## 2024-04-11 LAB — BASIC METABOLIC PANEL WITH GFR
Anion gap: 12 (ref 5–15)
BUN: 10 mg/dL (ref 6–20)
CO2: 21 mmol/L — ABNORMAL LOW (ref 22–32)
Calcium: 5.4 mg/dL — CL (ref 8.9–10.3)
Chloride: 90 mmol/L — ABNORMAL LOW (ref 98–111)
Creatinine, Ser: 0.32 mg/dL — ABNORMAL LOW (ref 0.44–1.00)
GFR, Estimated: >60 mL/min (ref >60)
Glucose, Bld: 267 mg/dL — ABNORMAL HIGH (ref 70–99)
Potassium: 3.3 mmol/L — ABNORMAL LOW (ref 3.5–5.1)
Sodium: 123 mmol/L — ABNORMAL LOW (ref 135–145)

## 2024-04-11 LAB — URINALYSIS, W/ REFLEX TO CULTURE (INFECTION SUSPECTED)
Bilirubin Urine: NEGATIVE
Glucose, UA: >=500 mg/dL — AB
Hgb urine dipstick: NEGATIVE
Ketones, ur: NEGATIVE mg/dL
Leukocytes,Ua: NEGATIVE
Nitrite: NEGATIVE
Protein, ur: 30 mg/dL — AB
Specific Gravity, Urine: 1.016 (ref 1.005–1.030)
pH: 6 (ref 5.0–8.0)

## 2024-04-11 LAB — HEMOGLOBIN AND HEMATOCRIT, BLOOD
HCT: 25.6 % — ABNORMAL LOW (ref 36.0–46.0)
Hemoglobin: 8.2 g/dL — ABNORMAL LOW (ref 12.0–15.0)

## 2024-04-11 LAB — OSMOLALITY, URINE: Osmolality, Ur: 419 mosm/kg (ref 300–900)

## 2024-04-11 LAB — PREPARE RBC (CROSSMATCH)

## 2024-04-11 LAB — MAGNESIUM: Magnesium: 1.9 mg/dL (ref 1.7–2.4)

## 2024-04-11 LAB — CALCIUM, IONIZED: Calcium, Ionized, Serum: 3.4 mg/dL — ABNORMAL LOW (ref 4.5–5.6)

## 2024-04-11 LAB — PHOSPHORUS: Phosphorus: 1.9 mg/dL — ABNORMAL LOW (ref 2.5–4.6)

## 2024-04-11 MED ORDER — SODIUM CHLORIDE 0.9 % IV SOLN
45.0000 mmol | Freq: Once | INTRAVENOUS | Status: AC
  Administered 2024-04-11: 45 mmol via INTRAVENOUS
  Filled 2024-04-11: qty 15

## 2024-04-11 MED ORDER — MAGNESIUM SULFATE 2 GM/50ML IV SOLN
2.0000 g | Freq: Once | INTRAVENOUS | Status: AC
  Administered 2024-04-11: 2 g via INTRAVENOUS
  Filled 2024-04-11: qty 50

## 2024-04-11 MED ORDER — SODIUM CHLORIDE 0.9 % IV SOLN: INTRAVENOUS | Status: AC

## 2024-04-11 MED ORDER — HYDROMORPHONE HCL 1 MG/ML IJ SOLN
1.0000 mg | Freq: Once | INTRAMUSCULAR | Status: AC
  Administered 2024-04-11: 1 mg via INTRAVENOUS
  Filled 2024-04-11: qty 1

## 2024-04-11 MED ORDER — CALCIUM GLUCONATE-NACL 2-0.675 GM/100ML-% IV SOLN
2.0000 g | Freq: Once | INTRAVENOUS | Status: AC
  Administered 2024-04-11: 2000 mg via INTRAVENOUS
  Filled 2024-04-11: qty 100

## 2024-04-11 MED ORDER — SODIUM CHLORIDE 0.9% IV SOLUTION: Freq: Once | INTRAVENOUS | Status: AC

## 2024-04-11 MED ORDER — HYDROMORPHONE HCL 1 MG/ML PO LIQD
1.0000 mg | ORAL | Status: DC | PRN
  Administered 2024-04-11 – 2024-04-12 (×2): 2 mg via ORAL
  Administered 2024-04-12: 1 mg via ORAL
  Administered 2024-04-12 – 2024-04-13 (×2): 2 mg via ORAL
  Filled 2024-04-11 (×4): qty 2
  Filled 2024-04-11: qty 1
  Filled 2024-04-11: qty 2

## 2024-04-11 MED ORDER — HYDROMORPHONE HCL 1 MG/ML IJ SOLN
1.0000 mg | INTRAMUSCULAR | Status: DC | PRN
  Administered 2024-04-11 – 2024-04-15 (×14): 1 mg via INTRAVENOUS
  Filled 2024-04-11 (×14): qty 1

## 2024-04-11 MED ORDER — POTASSIUM CHLORIDE 10 MEQ/50ML IV SOLN
10.0000 meq | INTRAVENOUS | Status: AC
  Administered 2024-04-11 (×6): 10 meq via INTRAVENOUS
  Filled 2024-04-11 (×6): qty 50

## 2024-04-11 NOTE — Progress Notes (Signed)
 NAME:  Chelsea Hansen, MRN:  [id number], DOB:  1970/11/22, LOS: 9 ADMISSION DATE:  04/01/2024 CONSULTATION DATE:  04/09/2024 REFERRING MD:  Raenelle - [facility], CHIEF COMPLAINT:  Hypotension, SVT   History of Present Illness:  53 year old woman who presented to [facility] 10/12 for n/v/d and electrolyte derangements. 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 Recent admission 9/29-10/8 for similar.  Initial admission 10/12 for n/v/d and electrolyte derangements; admitted to [facility]. Nephrology consulted for assistance with management of electrolyte abnormalities. Oncology following. PMT following for GOC and assistance with chronic pain management.  On 10/20, patient was noted to be febrile to 102F with abdominal pain (acute-on-chronic) and leaking G-tube. Patient was noted to be hypotensive and 2L fluid bolus was administered and broad-spectrum antibiotics (Zosyn ) started. PCT 9.11, LA 3.1, improved to 1.0 after fluids. Transferred to SDU/ICU. Patient was noted to be in SVT with HR 170s; adenosine 6mg  then 12mg  administered with persistent tachycardia. Patient became progressively hypotensive with SBP 80s and MAP 50s prompting initiation of Levophed. Repeat CT A/P pending.  PCCM consulted for ICU evaluation and management.  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 Lynwood Nasuti   Hyperlipidemia    on meds   Hypertension    on meds   Peritoneal carcinomatosis (HCC)    S/P percutaneous endoscopic gastrostomy (PEG) tube placement [facility])    SVT (supraventricular tachycardia)    Significant Hospital Events: Including procedures, antibiotic start and stop dates in addition to other pertinent events   10/20 - SVT with HR 170s on floor, not responsive to adenosine. PCCM consulted for ICU evaluation and management. Levophed + vasopressin initiated. 10/21 - Vasopressor requirements improved, off of Levo. Ongoing vaso. Onc/PMT consulting. 10/22 - Oncology recommending no further treatment, PMT supporting.  Interim History / Subjective:  No significant events Hgb 6.3 this AM, 1U PRBCs ordered Plt 40 Lovenox  held K 3.3, Ca 5.4, Phos 1.9; will replete lytes PMT following Asking how long she will be in the ICU  Objective:  Blood pressure (!) 98/57, pulse (!) 53, temperature (!) 97.4 F (36.3 C), temperature source Oral, resp. rate 11, height 5' 11 (1.803 m), weight 76.5 kg, SpO2 99%.        Intake/Output Summary (Last 24 hours) at 04/11/2024 0820 Last data filed at 04/11/2024 0809 Gross per 24 hour  Intake 2919.69 ml  Output 1725 ml  Net 1194.69 ml   Filed Weights   04/08/24 0514 04/10/24 0323 04/11/24 0459  Weight: 74.5 kg 74.7  kg 76.5 kg   Physical Examination: General: Acute-on-chronically ill-appearing middle-aged woman in NAD. HEENT: [place]/AT, anicteric sclera, PERRL, moist mucous membranes. Neuro: Drowsy, but wakes easily to voice. Responds to verbal stimuli. Following commands consistently. Moves all 4 extremities spontaneously.  Generalized weakness. CV: Mildly bradycardic to 50s, regular rhythm, no m/g/r. PULM: Breathing even and unlabored on RA. Lung fields CTAB. GI: Soft, mildly distended, diffuse generalized mild TTP. Hypoactive BS. Fistula/?prior ostomy site with scant drainage, PEG to LUQ. Extremities: No LE edema noted. Skin: Warm/dry, no rashes.  Resolved Hospital Problem List:  Lactic acidosis  Assessment & Plan:  Undifferentiated shock, presume septic; improving Klebsiella bacteremia CT Chest/A/P 10/21 with mild dermal thickening/edemaof L breast, pyloric/duodenal trace bowel wall thickening, known omental carcinomatosis, diffuse sclerotic metastatic skeletal lesions. - Goal MAP > 65, SBP > 90 - Fluid/volume resuscitation as tolerated, received blood today, NS per Nephro - Levophed titrated to goal MAP/SBP + vaso - Trend WBC, fever curve - F/u Cx data - Continue broad-spectrum antibiotics (ceftriaxone /Flagyl , narrow as able)  Tachycardia, resolved History of SVT HTN HLD S/p adenosine 6mg , 12mg . 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 Difficult discussion with Oncology (Dr. Odean) 10/21 re: prognosis; recommending no further chemotherapy due to progression, critical illness and poor tolerance. 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 - PEG care per protocol - Nutrition/RD following - PPI - Bowel regimen - WOC  Anemia of critical illness Hgb 6.3 10/22. - Trend H&H - Monitor for signs of active bleeding - Transfuse for Hgb < 7.0 or hemodynamically significant bleeding - 1U PRBCs 10/22  Multiple electrolyte abnormalities Hypocalcemia Hypokalemia Hypomagnesemia Hypophosphatemia - Nephrology following, appreciate assistance - Trend BMP, Phos, Mg - Replete electrolytes as indicated - Monitor I&Os - Avoid nephrotoxic agents as able - Ensure adequate renal perfusion  Anxiety/depression - Continue Zyprexa   Critical care time:    The patient is critically ill with multiple organ system failure and requires high complexity decision making for assessment and support, frequent evaluation and titration of therapies, advanced monitoring, review of radiographic studies and interpretation of complex data.   Critical Care Time devoted to patient care services, exclusive of separately billable procedures, described in this note is 34 minutes.  Chelsea CHRISTELLA Jazmyn Offner, PA-C [facility] 04/11/24 8:20 AM  Please see [email or web address] for pager details.  From 7A-7P if no response, please call [phone] After hours, please call ELink [phone]

## 2024-04-11 NOTE — Progress Notes (Signed)
 Patient ID: Chelsea Hansen, female   DOB: [birth year], 53 y.o.   MRN: [id number] [facility] Progress Note   Assessment/ Plan:   1.  Hypocalcemia: Secondary to recent denosumab  use in a patient with advanced breast cancer and metastasis to bones/peritoneum.  On intravenous and oral calcium  supplementation with surprisingly lower calcium  this morning.  Awaiting ionized calcium  level.  Continue calcium  with calcitriol  replacement.   2.  Supraventricular tachycardia: This is resolved with intravenous fluids/adenosine. 3.  Hypomagnesemia: Continue intermittent supplementation 4.  Hypophosphatemia: Continue oral phosphorus supplementation to complement recent intravenous supplement. 5.  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  Will check serum and urine osmolality and restrict oral fluid intake to 1.2 L/day.  Subjective:   Without any acute events overnight, some abdominal discomfort today.  Conversations between oncology and patient/family reviewed from yesterday with no additional chemotherapy to be offered.   Objective:   BP 110/88   Pulse 60   Temp (!) 97.2 F (36.2 C)   Resp 13   Ht 5' 11 (1.803 m)   Wt 76.5 kg   SpO2 100%   BMI 23.52 kg/m   Intake/Output Summary (Last 24 hours) at 04/11/2024 1201 Last data filed at 04/11/2024 1101 Gross per 24 hour  Intake 3255.89 ml  Output 1625 ml  Net 1630.89 ml   Weight change: 1.8 kg  Physical Exam: Gen: Chronically ill-appearing woman resting comfortably in bed, awakens to calling her name CVS: Regular tachycardia, S1 and S2 normal without murmur Resp: Diminished breath sounds over bases, poor inspiratory effort.  No rales/rhonchi Abd: Soft, flat, G-tube in situ with some tenderness around it.  Colostomy bag in place. Ext: No lower extremity edema  Imaging: CT CHEST ABDOMEN PELVIS W CONTRAST Result Date: 04/09/2024 EXAM: CT CHEST, ABDOMEN AND PELVIS WITH CONTRAST  04/09/2024 12:41:00 PM TECHNIQUE: CT of the chest, abdomen and pelvis was performed with the administration of 100 mL of iohexol  (OMNIPAQUE ) 300 MG/ML solution. Multiplanar reformatted images are provided for review. Automated exposure control, iterative reconstruction, and/or weight based adjustment of the mA/kV was utilized to reduce the radiation dose to as low as reasonably achievable. COMPARISON: CT abdomen and pelvis 04/01/2024. CLINICAL HISTORY: Abdominal abscess (Ped 0-17y); metastatic cancer, now with fever. Suspect abdominal wall abscess. Exam time extended due to patient transport by CT staff. FINDINGS: CHEST: MEDIASTINUM AND LYMPH NODES: Right chest wall access port-a-cath with tip terminating in the right atrium. Heart and pericardium are unremarkable. The central airways are clear. No mediastinal, hilar or axillary lymphadenopathy. LUNGS AND PLEURA: Limited evaluation of the lungs due to motion artifact. No central pulmonary embolus with limited evaluation more distally due to motion artifact and timing of contrast. No focal consolidation or pulmonary edema. No pleural effusion or pneumothorax. ABDOMEN AND PELVIS: LIMITATIONS: Limited evaluation of the abdomen and pelvis due to motion artifact. LIVER: The liver is enlarged, measuring up to 21 cm. GALLBLADDER AND BILE DUCTS: Gallbladder is unremarkable. No biliary ductal dilatation. SPLEEN: No acute abnormality. PANCREAS: No acute abnormality. ADRENAL GLANDS: No acute abnormality. KIDNEYS, URETERS AND BLADDER: No stones in the kidneys or ureters. No hydronephrosis. No perinephric or periureteral stranding. Urinary bladder is unremarkable. No filling defects of the partially visualized collecting systems on delayed imaging. GI AND BOWEL: Question left lower quadrant ileostomy. Small hiatal hernia. Gastrostomy tube in appropriate position with tip and inflated balloon within the gastric lumen. Question interval development of pyloric and first portion of  the duodenum trace bowel  wall thickening and slight irregularity with markedly limited evaluation due to motion artifact. Colonic diverticulosis. No large bowel wall thickening or dilatation. The appendix is unremarkable. No small bowel dilatation. There is no bowel obstruction. REPRODUCTIVE ORGANS: Status post hysterectomy. No adnexal mass. 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 No ascites. No free air. VASCULATURE: Aorta is normal in caliber. ABDOMINAL AND PELVIS LYMPH NODES: No lymphadenopathy. BONES AND SOFT TISSUES: Right breast lumpectomy. Questions mild dermal thickening and edema of the left breast (2.58). Diffuse mild-appearing soft tissue edema the the lower abdominal wall . Resolution of surrounding inflammatory changes along the left lateral abdominal wall musculature. Diffuse sclerotic changes metastatic lesions of the appendicular and axial skeleton. Chronic stable L3 fracture. No acute pathologic fracture identified. No focal soft tissue abnormality. IMPRESSION: 1. Questions mild dermal thickening and edema of the left breast. Recommend correlation with physical exam. 2. Question interval development of pyloric and first portion of the duodenum trace bowel wall thickening and slight irregularity with markedly limited evaluation due to motion artifact. This may represent artifact. 3. 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 4. Diffuse sclerotic changes metastatic lesions of the appendicular and axial skeleton. 5. Colonic diverticulosis with no acute diverticulitis. Electronically signed by: Kate Plummer MD 04/09/2024 09:16 PM EDT RP Workstation: [id number]    Labs: BMET Recent Labs  Lab 04/05/24 0014 04/05/24 1748 04/06/24 9565 04/07/24 9651 04/08/24 0240 04/09/24 0309 04/10/24 0717 04/11/24 0459  NA 134* 135 137 136 136 132* 127* 123*  K 4.7 4.7 3.9 3.9 3.7 3.6 3.2* 3.3*  CL 103 102 105 105 102 98 95* 90*  CO2 23 23 24 22 23 24  22  21*  GLUCOSE 160* 130* 93 136* 107* 118* 141* 267*  BUN <5* <5* <5* 5* 5* 8 10 10   CREATININE <0.30* 0.34* <0.30* 0.37* 0.35* 0.37* 0.39* 0.32*  CALCIUM  6.5* 6.7* 6.0* 6.8*  6.9* 7.0* 6.8* 7.0* 5.4*  PHOS 1.9*  --  1.8* 1.8* 1.7* 1.7* 2.1* 1.9*   CBC Recent Labs  Lab 04/08/24 0248 04/09/24 0309 04/10/24 0910 04/11/24 0459  WBC 6.4 4.9 9.8 9.8  NEUTROABS  --   --  8.4* 9.0*  HGB 8.9* 8.5* 7.6* 6.3*  HCT 27.6* 25.6* 24.1* 19.4*  MCV 82.4 82.3 82.5 80.8  PLT 169 122* 63* 40*    Medications:     calcitRIOL   0.25 mcg Per Tube BID   calcium  carbonate (dosed in mg elemental calcium )  2,500 mg of elemental calcium  Per Tube TID BM   Chlorhexidine  Gluconate Cloth  6 each Topical Daily   docusate  200 mg Per Tube Daily   ergocalciferol  (VITAMIN D2)  8,000 Units Per Tube Daily   feeding supplement (KATE FARMS STANDARD 1.4)  325 mL Oral BID BM   fentaNYL   1 patch Transdermal Q72H   hydrocortisone sod succinate (SOLU-CORTEF) inj  100 mg Intravenous Q8H   magnesium  oxide  400 mg Per Tube BID   multivitamin with minerals  1 tablet Per Tube Daily   OLANZapine  zydis  5 mg Oral QHS   pantoprazole  (PROTONIX ) IV  40 mg Intravenous Q24H   phosphorus  250 mg Per Tube TID   sodium chloride  flush  10-40 mL Intracatheter Q12H   sorbitol   30 mL Per Tube [id number]    Gordy Blanch, MD 04/11/2024, 12:01 PM

## 2024-04-11 NOTE — Progress Notes (Signed)
 eLink Physician-Brief Progress Note Patient Name: AVELINE DAUS DOB: 07/30/1970 MRN: [id number]   Date of Service  04/11/2024  HPI/Events of Note  89F with metastatic breast CA (bone/peritoneum/pancreas) transferred from floor for sepsis, SVT unresponsive to adenosine, hypotension.  Numerous electrolyte abnormalities, hemoglobin 6.3 without active bleeding  eICU Interventions  Electrolyte supplementation in addition to scheduled KCl and magnesium   1 unit PRBC     Intervention Category Intermediate Interventions: Bleeding - evaluation and treatment with blood products Minor Interventions: Electrolytes abnormality - evaluation and management  Masahiro Iglesia 04/11/2024, 6:47 AM

## 2024-04-11 NOTE — Progress Notes (Signed)
 While completing assessment on patient, RN noticed something was leaking/dripping out of chair. Looked under patients gown and PEG tube was dislodged. RN had just assessed abdominal area five minutes prior and tube was in place. Charge nurse was paged and E-link was called in room at bedside. Charge nurse deflated PEG balloon, reinserted tube, and reinflated balloon. Tube secured with gauze and tape. Dr.Paliwal virtually called in to speak with patient and informed her of plan of care for dislodgement. MD verbal order nothing by tube and hold all medications given per tube.

## 2024-04-11 NOTE — Progress Notes (Signed)
 [facility] Spiritual Care Note  Followed up with Shona briefly by phone while she was trying to nap this afternoon. [facility] chaplain will continue to follow for support, and Chanler knows to contact chaplain or to request chaplain visit, but please also page if immediate needs arise.  [facility] Chaplain [name], [place], [facility] M-F daytime pager [phone] [facility] 24/7 pager [phone] Voicemail [phone]

## 2024-04-11 NOTE — Progress Notes (Signed)
 Daily Progress Note   Patient Name: Chelsea Hansen       Date: 04/11/2024 DOB: 03/22/1971  Age: 53 y.o. MRN#: [id number] Attending Physician: Annella Donnice SAUNDERS, MD Primary Care Physician: [name], NP Admit Date: 04/01/2024 Length of Stay: 9 days  Reason for Consultation/Follow-up: Establishing goals of care  Subjective:   Reviewed EMR including recent documentation from oncologist, chaplain, and PCCM provider.  Upon review of vitals, patient now having bradycardia.  Patient also continues to have issues with hypotension requiring pressor support.  At time of EMR review of the past 24 hours patient has received as needed IV Dilaudid  0.5 mg x 3 doses. Discussed care with PCCM provider and bedside RN for medical updates.  Presented to bedside to see patient.  No visitors present at bedside.  Again introduced myself as a member of the palliative medicine team.  Inquired if patient had further considered anything that had been discussed yesterday.  Patient noted that she is still discussing options with her family and trying to pursue all avenues.  Acknowledged this.  Inquired if patient had further considered CODE STATUS and at this time patient wants to remain full code.  Encouraged further conversations with family and providers regarding this.  Able to inquire about symptom management as well.  Patient notes that her pain is not well-controlled currently.  Patient feels that the IV Dilaudid  does help though not enough.  Noted can increase dose.  Patient agreeing with this plan.  Also discussed starting patient on oral medications since that would last longer than the IV medication.  Patient does not want to take oxycodone  as she does not feel it helps her pain at all.  Noted could place patient on oral Dilaudid  liquid to assist with pain management.  Patient willing to try this.  Did explain to patient trying to balance her ability to interact and medical concerns with still allowing  for pain management.  Patient inquired when she would be able to get out of the hospital.  Again spent time explaining that patient is incredibly ill and is requiring medications to artificially keep her blood pressure elevated.  Discussed primary team continuing to wean as tolerated and taking it a day at a time.  Also discussed depends on what patient's plans are moving forward.  Patient acknowledged and noted still considering things.  All questions answered at that time.  Noted palliative medicine team continue to follow with patient's medical journey.  Discussed care with PCCM provider and RN after visit.  Objective:   Vital Signs:  BP (!) 98/57   Pulse (!) 53   Temp 97.6 F (36.4 C) (Oral)   Resp 11   Ht 5' 11 (1.803 m)   Wt 76.5 kg   SpO2 99%   BMI 23.52 kg/m   Physical Exam: General: NAD, alert, chronically ill-appearing Cardiovascular: Bradycardia noted Respiratory: No increased increased work of breathing noted, not in respiratory distress Neuro: Awake, interactive, following commands easily Psych: Anxious  Assessment & Plan:   Assessment: Patient is a 53 year old female with a past medical history of metastatic breast cancer with disease to bone/pancreas/peritoneum and complicated by duodenal obstruction requiring PEG tube placement, hypertension, hyperlipidemia, SVT, DVT, asthma, and anxiety/depression who was admitted on 04/01/2024 for nausea and vomiting.  Patient had recently been admitted from 9/29-10/8 for management as well.  During admission, patient required transfer to ICU for management of presumed septic shock in the setting of Klebsiella bacteremia, tachycardia with history of SVT,  and intestinal obstruction.  Nephrology and oncology consulted for recommendations during hospitalization.  Palliative medicine team consulted to assist with complex medical decision making and symptom management.   Of note patient known to PMT from outpatient  visits.  Recommendations/Plan: # Complex medical decision making/goals of care:  - Discussed care with patient as detailed above in HPI.  Had extensive discussion with patient while her parents were at bedside on 04/10/2024.  Has already been discussed by oncologist that patient is not a candidate for further cancer directed therapies due to overall functional status and multiple medical comorbidities.  Was already explained that any additional chemotherapy treatments would not offer survival advantage, would in fact likely decrease survival.  Patient continues to have difficulties processing this information.  Have already introduced idea of hospice support at home and what would and would not be provided.  Patient does have difficult home situation with lack of stable housing and support for care at home.  Continues to have difficulties processing the severity of her underlying illness.  Palliative medicine team continuing to engage in conversations as able and appropriate moving forward.  -  Code Status: Full Code   - Have CODE STATUS with patient and already expressed concern that if patient were sick enough her heart were to stop or she would have stopped breathing, interventions such as cardiac resuscitation and intubation with mechanical ventilation would not lead to good quality of life outcomes and would not reverse her underlying metastatic cancer.  Have recommended patient consider change of CODE STATUS to DNR/DNI.  Discussed that if patient does remain full code, would fall to her children to make decision about palliative extubation in setting of metastatic cancer at end-of-life.  # Psychosocial Support:  - Patient's parents, 2 sons, 2 daughters listed in EMR  # Discharge Planning: To Be Determined  Discussed with: Patient, bedside RN, PCCM provider  Thank you for allowing the palliative care team to participate in the care Chelsea Hansen.  Tinnie Radar, DO Palliative Care Provider PMT  # [phone]  If patient remains symptomatic despite maximum doses, please call PMT at [phone] between 0700 and 1900. Outside of these hours, please call attending, as PMT does not have night coverage.  Personally spent 35 minutes in patient care including extensive chart review (labs, imaging, progress/consult notes, vital signs), medically appropraite exam, discussed with treatment team, education to patient, family, and staff, documenting clinical information, medication review and management, coordination of care, and available advanced directive documents.

## 2024-04-11 NOTE — Patient Instructions (Signed)
 Visit Information  Thank you for taking time to visit with me today.   Tailored Plan Medicaid On December 20, 2022 some people on [place] Medicaid will move to a new kind of Medicaid health plan called a Tailored Plan. Tailored Plans cover your doctor visits, prescription drugs, and health care services.    If your [place] Medicaid will move to a Tailored Plan, you should have gotten a letter and welcome packet. If you're not sure, call your [place] Medicaid Enrollment Broker at [phone] and ask.  Check out these free materials, in [place] and [place], to learn more about your Tailored Plan: [email or web address].Gov/Tailored-Plans/Toolkit  Tailored Care Management Services  TCM services are available to you now. If you are a Tailored Plan member or will be and want information about Tailored Care Management Services including rides to appointments and community and home services, call the Care Management provider for your county of residence:    [facility], [place])  Member Services: [phone] Behavioral Health Crisis Line: [phone], [place], [place], [place], [place])  Member Services: [phone] Behavioral Health Crisis Line: [phone]     Please call the Suicide and Crisis Lifeline: 988 go to [facility] [address], [place] [phone]) call 911 if you are experiencing a Mental Health or Behavioral Health Crisis or need someone to talk to.  Patient verbalizes understanding of instructions and care plan provided today and agrees to view in MyChart. Active MyChart status and patient understanding of how to access instructions and care plan via MyChart confirmed with patient.     Tobias CHARM Maranda HEDWIG, PhD [facility], [facility] Social Worker Direct Dial: [phone]  Fax: [phone]

## 2024-04-11 NOTE — Patient Outreach (Signed)
 04/11/2024  Good afternoon,   We received your PCP/Provider Request for Care Manager Name or Assignment Referral Form. The member Thailyn Khalid Jul 21, 2070 is currently assigned to [facility].  The contact information for [facility] is: [email or web address]; [email or web address]; [email or web address]; [email or web address]    Thank you, Waldon Ellen       [facility] Population Health Operations Coordinator Pronouns: She, Her, Her   [email or web address] SQUIBB [phone] F [phone]   From [facility] Good afternoon,  Our records show unsuccessful TCM engagement for this member. Thanks for sharing the updated contact information. Someone from our outreach department will provide outreach. Member will be linked to a CM upon receiving verbal consent to participate in TCM with our agency.   Regards,  Frankey Alert MA, [facility] TCM Supervisor [employer]. [email or web address]

## 2024-04-11 NOTE — Patient Outreach (Signed)
 04/11/2024  Patient is currently admitted into [facility], SW called and patient still wanted to keep the appointment. Patient stated that she has never spoken or had contact with her [facility] CM. SW will make the referral to [facility] to find out who the assigned case manager is. Patient did not have any other SDOH needs, only would like the food pantry list mailed to her and she is going to talk to there [facility] CM about getting a bigger house, she is currently living with her daughter.   Tobias CHARM Maranda HEDWIG, PhD [facility], [facility] Social Worker Direct Dial: [phone]  Fax: [phone]

## 2024-04-11 NOTE — Progress Notes (Signed)
 eLink Physician-Brief Progress Note Patient Name: Chelsea Hansen DOB: [birth year] MRN: [id number]   Date of Service  04/11/2024  HPI/Events of Note  Tube patient's gastrostomy tube got dislodged and pulled during usual care.  Patient had some discomfort along with this.  Tube was able to be placed through the stoma which was initially placed in July 2025.  No immediate discomfort with replacement  eICU Interventions  Maintain tube as is with bulb up, will refer to the primary team for IR consultation-May need to be exchanged with larger size versus tube a gram in the morning prior to reuse.  Dilaudid  x 1 for the discomfort     Intervention Category Minor Interventions: Routine modifications to care plan (e.g. PRN medications for pain, fever)  Nardos Putnam 04/11/2024, 8:08 PM

## 2024-04-12 ENCOUNTER — Inpatient Hospital Stay (HOSPITAL_COMMUNITY): Payer: MEDICAID

## 2024-04-12 DIAGNOSIS — Z515 Encounter for palliative care: Secondary | ICD-10-CM | POA: Diagnosis not present

## 2024-04-12 DIAGNOSIS — R7881 Bacteremia: Secondary | ICD-10-CM | POA: Diagnosis not present

## 2024-04-12 DIAGNOSIS — E878 Other disorders of electrolyte and fluid balance, not elsewhere classified: Secondary | ICD-10-CM | POA: Diagnosis not present

## 2024-04-12 DIAGNOSIS — R579 Shock, unspecified: Secondary | ICD-10-CM | POA: Diagnosis not present

## 2024-04-12 DIAGNOSIS — C7889 Secondary malignant neoplasm of other digestive organs: Secondary | ICD-10-CM | POA: Diagnosis not present

## 2024-04-12 DIAGNOSIS — Z7189 Other specified counseling: Secondary | ICD-10-CM | POA: Diagnosis not present

## 2024-04-12 DIAGNOSIS — I10 Essential (primary) hypertension: Secondary | ICD-10-CM | POA: Diagnosis not present

## 2024-04-12 DIAGNOSIS — G893 Neoplasm related pain (acute) (chronic): Secondary | ICD-10-CM

## 2024-04-12 DIAGNOSIS — R000 Tachycardia, unspecified: Secondary | ICD-10-CM | POA: Diagnosis not present

## 2024-04-12 HISTORY — PX: IR REPLC GASTRO/COLONIC TUBE PERCUT W/FLUORO: IMG2333

## 2024-04-12 LAB — CBC WITH DIFFERENTIAL/PLATELET
Abs Immature Granulocytes: 0.16 K/uL — ABNORMAL HIGH (ref 0.00–0.07)
Basophils Absolute: 0 K/uL (ref 0.0–0.1)
Basophils Relative: 0 %
Eosinophils Absolute: 0 K/uL (ref 0.0–0.5)
Eosinophils Relative: 0 %
HCT: 26.1 % — ABNORMAL LOW (ref 36.0–46.0)
Hemoglobin: 8.7 g/dL — ABNORMAL LOW (ref 12.0–15.0)
Immature Granulocytes: 1 %
Lymphocytes Relative: 8 %
Lymphs Abs: 0.9 K/uL (ref 0.7–4.0)
MCH: 27.4 pg (ref 26.0–34.0)
MCHC: 33.3 g/dL (ref 30.0–36.0)
MCV: 82.3 fL (ref 80.0–100.0)
Monocytes Absolute: 1.1 K/uL — ABNORMAL HIGH (ref 0.1–1.0)
Monocytes Relative: 10 %
Neutro Abs: 9.4 K/uL — ABNORMAL HIGH (ref 1.7–7.7)
Neutrophils Relative %: 81 %
Platelets: 39 K/uL — ABNORMAL LOW (ref 150–400)
RBC: 3.17 MIL/uL — ABNORMAL LOW (ref 3.87–5.11)
RDW: 16.6 % — ABNORMAL HIGH (ref 11.5–15.5)
WBC: 11.6 K/uL — ABNORMAL HIGH (ref 4.0–10.5)
nRBC: 0 % (ref 0.0–0.2)

## 2024-04-12 LAB — BASIC METABOLIC PANEL WITH GFR
Anion gap: 10 (ref 5–15)
BUN: 8 mg/dL (ref 6–20)
CO2: 22 mmol/L (ref 22–32)
Calcium: 6.6 mg/dL — ABNORMAL LOW (ref 8.9–10.3)
Chloride: 103 mmol/L (ref 98–111)
Creatinine, Ser: 0.4 mg/dL — ABNORMAL LOW (ref 0.44–1.00)
GFR, Estimated: >60 mL/min (ref >60)
Glucose, Bld: 160 mg/dL — ABNORMAL HIGH (ref 70–99)
Potassium: 3.5 mmol/L (ref 3.5–5.1)
Sodium: 134 mmol/L — ABNORMAL LOW (ref 135–145)

## 2024-04-12 LAB — BPAM RBC
Blood Product Expiration Date: 202511182359
ISSUE DATE / TIME: 202510221046
Unit Type and Rh: 7300

## 2024-04-12 LAB — TYPE AND SCREEN
ABO/RH(D): B POS
Antibody Screen: NEGATIVE
Unit division: 0

## 2024-04-12 LAB — MAGNESIUM: Magnesium: 2.5 mg/dL — ABNORMAL HIGH (ref 1.7–2.4)

## 2024-04-12 LAB — PHOSPHORUS: Phosphorus: 1.9 mg/dL — ABNORMAL LOW (ref 2.5–4.6)

## 2024-04-12 MED ORDER — CALCIUM GLUCONATE-NACL 2-0.675 GM/100ML-% IV SOLN
2.0000 g | Freq: Once | INTRAVENOUS | Status: AC
  Administered 2024-04-12: 2000 mg via INTRAVENOUS
  Filled 2024-04-12: qty 100

## 2024-04-12 MED ORDER — LACTATED RINGERS IV SOLN: INTRAVENOUS | Status: DC

## 2024-04-12 MED ORDER — SORBITOL 70 % SOLN
30.0000 mL | Freq: Every day | Status: DC
  Administered 2024-04-13: 30 mL via ORAL
  Filled 2024-04-12: qty 30

## 2024-04-12 MED ORDER — ADULT MULTIVITAMIN W/MINERALS CH
1.0000 | ORAL_TABLET | Freq: Every day | ORAL | Status: DC
  Filled 2024-04-12: qty 1

## 2024-04-12 MED ORDER — K PHOS MONO-SOD PHOS DI & MONO 155-852-130 MG PO TABS
250.0000 mg | ORAL_TABLET | Freq: Three times a day (TID) | ORAL | Status: DC
  Filled 2024-04-12: qty 1

## 2024-04-12 MED ORDER — POTASSIUM CHLORIDE 10 MEQ/50ML IV SOLN
10.0000 meq | INTRAVENOUS | Status: AC
  Administered 2024-04-12 (×3): 10 meq via INTRAVENOUS
  Filled 2024-04-12 (×3): qty 50

## 2024-04-12 MED ORDER — CALCITRIOL 0.25 MCG PO CAPS
0.2500 ug | ORAL_CAPSULE | Freq: Two times a day (BID) | ORAL | Status: DC
  Filled 2024-04-12: qty 1

## 2024-04-12 MED ORDER — ERGOCALCIFEROL 200 MCG/ML PO SOLN
8000.0000 [IU] | Freq: Every day | ORAL | Status: DC
  Filled 2024-04-12: qty 1

## 2024-04-12 MED ORDER — MAGNESIUM OXIDE -MG SUPPLEMENT 400 (240 MG) MG PO TABS
400.0000 mg | ORAL_TABLET | Freq: Two times a day (BID) | ORAL | Status: DC
  Filled 2024-04-12: qty 1

## 2024-04-12 MED ORDER — CALCIUM CARBONATE ANTACID 1250 MG/5ML PO SUSP
2500.0000 mg | Freq: Three times a day (TID) | ORAL | Status: DC
  Filled 2024-04-12 (×2): qty 25

## 2024-04-12 MED ORDER — SODIUM PHOSPHATES 45 MMOLE/15ML IV SOLN
45.0000 mmol | Freq: Once | INTRAVENOUS | Status: AC
  Administered 2024-04-12: 45 mmol via INTRAVENOUS
  Filled 2024-04-12: qty 15

## 2024-04-12 MED ORDER — DOCUSATE SODIUM 100 MG PO CAPS
200.0000 mg | ORAL_CAPSULE | Freq: Every day | ORAL | Status: DC
  Filled 2024-04-12: qty 2

## 2024-04-12 MED ORDER — IOHEXOL 300 MG/ML  SOLN
50.0000 mL | Freq: Once | INTRAMUSCULAR | Status: AC | PRN
  Administered 2024-04-12: 15 mL

## 2024-04-12 MED ORDER — LIDOCAINE VISCOUS HCL 2 % MT SOLN
15.0000 mL | Freq: Once | OROMUCOSAL | Status: AC
  Administered 2024-04-12: 5 mL via OROMUCOSAL
  Filled 2024-04-12: qty 15

## 2024-04-12 MED ORDER — LIDOCAINE-EPINEPHRINE 1 %-1:100000 IJ SOLN
INTRAMUSCULAR | Status: AC
  Filled 2024-04-12: qty 1

## 2024-04-12 MED ORDER — LIDOCAINE VISCOUS HCL 2 % MT SOLN
OROMUCOSAL | Status: AC
  Filled 2024-04-12: qty 15

## 2024-04-12 NOTE — Progress Notes (Signed)
 NAME:  Chelsea Hansen, MRN:  [id number], DOB:  [birth year], LOS: 10 ADMISSION DATE:  04/01/2024 CONSULTATION DATE:  04/09/2024 REFERRING MD:  Raenelle - [facility], CHIEF COMPLAINT:  Hypotension, SVT   History of Present Illness:  53 year old woman who presented to [facility] 10/12 for n/v/d and electrolyte derangements. 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 Recent admission 9/29-10/8 for similar.  Initial admission 10/12 for n/v/d and electrolyte derangements; admitted to [facility]. Nephrology consulted for assistance with management of electrolyte abnormalities. Oncology following. PMT following for GOC and assistance with chronic pain management.  On 10/20, patient was noted to be febrile to 102F with abdominal pain (acute-on-chronic) and leaking G-tube. Patient was noted to be hypotensive and 2L fluid bolus was administered and broad-spectrum antibiotics (Zosyn ) started. PCT 9.11, LA 3.1, improved to 1.0 after fluids. Transferred to SDU/ICU. Patient was noted to be in SVT with HR 170s; adenosine 6mg  then 12mg  administered with persistent tachycardia. Patient became progressively hypotensive with SBP 80s and MAP 50s prompting initiation of Levophed. Repeat CT A/P pending.  PCCM consulted for ICU evaluation and management.  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 Lynwood Nasuti   Hyperlipidemia    on meds   Hypertension    on meds   Peritoneal carcinomatosis (HCC)    S/P percutaneous endoscopic gastrostomy (PEG) tube placement [facility])    SVT (supraventricular tachycardia)    Significant Hospital Events: Including procedures, antibiotic start and stop dates in addition to other pertinent events   10/20 - SVT with HR 170s on floor, not responsive to adenosine. PCCM consulted for ICU evaluation and management. Levophed + vasopressin initiated. 10/21 - Vasopressor requirements improved, off of Levo. Ongoing vaso. Onc/PMT consulting. 10/22 - Oncology recommending no further treatment, PMT supporting. Weaned off pressors  Interim History / Subjective:  G-tube dislodged overnight Patient in no acute distress  Objective:  Blood pressure (!) 100/59, pulse 70, temperature (!) 97.5 F (36.4 C), temperature source Oral, resp. rate (!) 21, height 5' 11 (1.803 m), weight 78.9 kg, SpO2 97%.        Intake/Output Summary (Last 24 hours) at 04/12/2024 0839 Last data filed at 04/12/2024 0412 Gross per 24 hour  Intake 2954.6 ml  Output 2550 ml  Net 404.6 ml   Filed Weights   04/10/24 0323 04/11/24 0459 04/12/24 0500  Weight: 74.7 kg 76.5 kg 78.9 kg   Physical Examination: General:  NAD HEENT: MM pink/moist Neuro: Aox3; MAE CV: s1s2, brady 50s, no m/r/g PULM:  dim clear BS bilaterally; room air GI: soft, bsx4 active  Extremities: warm/dry, no edema  Skin: no rashes or lesions    Resolved Hospital Problem List:  Lactic acidosis  Assessment & Plan:  Undifferentiated shock, presume septic; improving Klebsiella bacteremia CT Chest/A/P 10/21  with mild dermal thickening/edemaof L breast, pyloric/duodenal trace bowel wall thickening, known omental carcinomatosis, diffuse sclerotic metastatic skeletal lesions. Plan: -off pressors since yesterday; map goal >65 -LR fluids while npo until g-tube can get replaced -stress dose steroids -cultures sensitive to rocephin ; dc flagyl   Tachycardia, resolved History of SVT HTN HLD S/p adenosine 6mg , 12mg . 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 Difficult discussion with Oncology (Dr. Odean) 10/21 re: prognosis; recommending no further chemotherapy due to progression, critical illness and poor tolerance. 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 Plan: -IR consulted for dislodged G tube; NPO for now -PPI -RD following -woc   Anemia of critical illness Hgb 6.3 10/22. Plan: -trend cbc  Multiple electrolyte abnormalities Hypocalcemia Hypokalemia Hypomagnesemia Hypophosphatemia Plan: - Nephrology following, appreciate assistance - Trend BMP, Phos, Mg and replete electrolytes as indicated - Monitor I&Os - Avoid nephrotoxic agents as able - Ensure adequate renal perfusion  Anxiety/depression Plan: - Continue Zyprexa   Critical care time:      Norleen JONETTA Emilio DEVONNA [facility] 04/12/24 8:39 AM  Please see [email or web address] for pager details.  From 7A-7P if no response, please call  [phone] After hours, please call ELink [phone]

## 2024-04-12 NOTE — Progress Notes (Signed)
 Patient ID: Chelsea Hansen, female   DOB: 02-29-1971, 53 y.o.   MRN: [id number] [facility] Progress Note   Assessment/ Plan:   1.  Hypocalcemia: Secondary to recent denosumab  use in a patient with advanced breast cancer and metastasis to bones/peritoneum.  On intravenous and oral calcium  supplementation with surprisingly lower calcium  this morning.  Awaiting ionized calcium  level.  Continue calcium  with calcitriol  replacement.   2.  Supraventricular tachycardia: This is resolved with intravenous fluids/adenosine. 3.  Hypomagnesemia: Continue intermittent supplementation 4.  Hypophosphatemia: Continue oral phosphorus supplementation to complement recent intravenous supplement. 5.  Hyponatremia: Labs indicative of activated ADH state; may have been functional with recent SVT/relative hypotension or paraneoplastic SIADH.  Sodium level trending up.  Nephrology will sign off at this time and remain available for questions or concerns.  Subjective:   Denies any complaints and wants to see the surgical service taking care of her tubes   Objective:   BP 120/79   Pulse 60   Temp 98.2 F (36.8 C) (Oral)   Resp 13   Ht 5' 11 (1.803 m)   Wt 78.9 kg   SpO2 98%   BMI 24.26 kg/m   Intake/Output Summary (Last 24 hours) at 04/12/2024 1155 Last data filed at 04/12/2024 9587 Gross per 24 hour  Intake 2330.16 ml  Output 2450 ml  Net -119.84 ml   Weight change: 2.4 kg  Physical Exam: Gen: Chronically ill-appearing woman who is resting comfortably in bed with parents by her side CVS: Regular rhythm, normal rate, S1 and S2 normal without murmur Resp: Diminished breath sounds over bases, poor inspiratory effort.  No rales/rhonchi Abd: Soft, flat, G-tube in situ with some tenderness around it.  Colostomy bag in place. Ext: No lower extremity edema  Imaging: No results found.   Labs: BMET Recent Labs  Lab 04/06/24 0434 04/07/24 0348 04/08/24 0240 04/09/24 0309  04/10/24 0717 04/11/24 0459 04/12/24 0438  NA 137 136 136 132* 127* 123* 134*  K 3.9 3.9 3.7 3.6 3.2* 3.3* 3.5  CL 105 105 102 98 95* 90* 103  CO2 24 22 23 24 22  21* 22  GLUCOSE 93 136* 107* 118* 141* 267* 160*  BUN <5* 5* 5* 8 10 10 8   CREATININE <0.30* 0.37* 0.35* 0.37* 0.39* 0.32* 0.40*  CALCIUM  6.0* 6.8*  6.9* 7.0* 6.8* 7.0* 5.4* 6.6*  PHOS 1.8* 1.8* 1.7* 1.7* 2.1* 1.9* 1.9*   CBC Recent Labs  Lab 04/09/24 0309 04/10/24 0910 04/11/24 0459 04/11/24 1514 04/12/24 0438  WBC 4.9 9.8 9.8  --  11.6*  NEUTROABS  --  8.4* 9.0*  --  9.4*  HGB 8.5* 7.6* 6.3* 8.2* 8.7*  HCT 25.6* 24.1* 19.4* 25.6* 26.1*  MCV 82.3 82.5 80.8  --  82.3  PLT 122* 63* 40*  --  39*    Medications:     calcitRIOL   0.25 mcg Per Tube BID   calcium  carbonate (dosed in mg elemental calcium )  2,500 mg of elemental calcium  Per Tube TID BM   Chlorhexidine  Gluconate Cloth  6 each Topical Daily   docusate  200 mg Per Tube Daily   ergocalciferol  (VITAMIN D2)  8,000 Units Per Tube Daily   feeding supplement (KATE FARMS STANDARD 1.4)  325 mL Oral BID BM   fentaNYL   1 patch Transdermal Q72H   hydrocortisone sod succinate (SOLU-CORTEF) inj  100 mg Intravenous Q8H   magnesium  oxide  400 mg Per Tube BID   multivitamin with minerals  1 tablet Per  Tube Daily   OLANZapine  zydis  5 mg Oral QHS   pantoprazole  (PROTONIX ) IV  40 mg Intravenous Q24H   phosphorus  250 mg Per Tube TID   sodium chloride  flush  10-40 mL Intracatheter Q12H   sorbitol   30 mL Per Tube Q0600    Gordy Blanch, MD 04/12/2024, 11:55 AM

## 2024-04-12 NOTE — Progress Notes (Signed)
 Patients PEG tube in place but increased drainage. Purulent discharge with clear/yellow drainage and red tinged. Gauze changed and ABD pad placed.

## 2024-04-12 NOTE — TOC Progression Note (Signed)
 Transition of Care [facility]) - Progression Note    Patient Details  Name: Chelsea Hansen MRN: [id number] Date of Birth: 1970-11-18  Transition of [facility]) CM/SW Contact  Jon ONEIDA Anon, RN Phone Number: 04/12/2024, 12:28 PM  Clinical Narrative:    Pt having procedure today, replacement of G-tube. Needing continued medical workup, not medically stable for discharge. Pt being followed by Palliative medicine to establish GOC. ICM will continue to follow to assist with any new recommendations or DC needs.                     Expected Discharge Plan and Services                                               Social Drivers of Health (SDOH) Interventions SDOH Screenings   Food Insecurity: No Food Insecurity (04/11/2024)  Recent Concern: Food Insecurity - Food Insecurity Present (02/16/2024)  Housing: Low Risk  (04/02/2024)  Transportation Needs: No Transportation Needs (04/01/2024)  Utilities: Not At Risk (04/01/2024)  Depression (PHQ2-9): Medium Risk (03/30/2024)  Financial Resource Strain: High Risk (02/13/2024)  Physical Activity: Inactive (02/13/2024)  Social Connections: Moderately Isolated (04/01/2024)  Stress: No Stress Concern Present (02/13/2024)  Tobacco Use: Low Risk  (04/02/2024)  Recent Concern: Tobacco Use - High Risk (01/27/2024)   Received from [facility]  Health Literacy: Adequate Health Literacy (02/13/2024)    Readmission Risk Interventions    03/20/2024    3:17 PM 03/05/2024    4:03 PM 12/07/2023   12:21 PM  Readmission Risk Prevention Plan  Transportation Screening  Complete Complete  Medication Review (RN Care Manager) Complete Complete Complete  PCP or Specialist appointment within 3-5 days of discharge Complete Complete Complete  HRI or Home Care Consult Complete Complete Complete  SW Recovery Care/Counseling Consult Complete Complete Complete  Palliative Care Screening Not Applicable Not Applicable Not Applicable   Skilled Nursing Facility Not Applicable Not Applicable Not Applicable

## 2024-04-12 NOTE — Progress Notes (Signed)
 Nutrition Follow-up  DOCUMENTATION CODES:   Non-severe (moderate) malnutrition in context of chronic illness  INTERVENTION:  - Regular diet.  [employer] 1.4 PO BID, each supplement provides 455 kcal and 20 grams protein. - Magic cup TID with meals, each supplement provides 290 kcal and 9 grams of protein  - Multivitamin with minerals daily via tube - Monitor weight trends.  NUTRITION DIAGNOSIS:   Moderate Malnutrition related to chronic illness, cancer and cancer related treatments as evidenced by severe muscle depletion, moderate fat depletion. *ongoing  GOAL:   Patient will meet greater than or equal to 90% of their needs *progressing  MONITOR:   PO intake, Supplement acceptance  REASON FOR ASSESSMENT:   Consult Assessment of nutrition requirement/status  ASSESSMENT:   53 year old with metastastic breast cancer to bone, peritoneum, pancreas with duodenal obstruction s/p G tube who was admitted on 10/12 for nausea, vomiting, diarrhea, fall, and electrolyte abnormalities.  Patient's G-tube (venting) fell out overnight. Patient out of room at time of visit this AM getting tube replaced in IR.  There has been no PO intake documented since last follow up. She refused both [employer] yesterday. Unable to determine if patient eating [employer].   No aggressive nutrition interventions planned at this time as Oncology recommending no further chemo due to progression and poor tolerance. Palliative care following patient and are recommending DNR/DNI due to terminal nature of patient's advanced breast cancer with mets to bone, pancreas, peritoneum.     Admit weight: 166# Current weight: 173# I&O's: +23.7L since admit   Medications reviewed and include: Colace, 8000 units vitamin D , MVI, K PHOS  TID   Labs reviewed:  Na 134 Phosphorus 1.9 Magnesium  2.5  Diet Order:   Diet Order             Diet regular Room service appropriate? Yes; Fluid consistency: Thin; Fluid  restriction: 1200 mL Fluid  Diet effective now                   EDUCATION NEEDS:  Education needs have been addressed  Skin:  Skin Assessment: Skin Integrity Issues: Skin Integrity Issues:: Stage II Stage II: sacrum  Last BM:  10/20  Height:  Ht Readings from Last 1 Encounters:  04/01/24 5' 11 (1.803 m)   Weight:  Wt Readings from Last 1 Encounters:  04/12/24 78.9 kg    BMI:  Body mass index is 24.26 kg/m.  Estimated Nutritional Needs:  Kcal:  2200-2400 Protein:  110-125g Fluid:  2.2L/day    Trude Ned RD, LDN Contact via Secure Chat.

## 2024-04-12 NOTE — Progress Notes (Signed)
 Daily Progress Note   Patient Name: Chelsea Hansen       Date: 04/12/2024 DOB: 15-Aug-1970  Age: 53 y.o. MRN#: [id number] Attending Physician: Annella Donnice SAUNDERS, MD Primary Care Physician: [name], NP Admit Date: 04/01/2024 Length of Stay: 10 days  Reason for Consultation/Follow-up: Establishing goals of care  Subjective:   Reviewed EMR including recent documentation from [facility] and nephrology providers.  Discussed care with bedside RN and PCCM provider for medical updates.  Patient currently off pressor support.  Overnight G-tube dislodged.  IR being consulted for replacement.  At time of EMR review on past 24 hours patient has received as needed p.o. Dilaudid  2 mg x 2 doses and 1 mg x 1 dose.  Patient also received as needed IV Dilaudid  1 mg x 1 dose. PCCM provider discussed updates with family.  Patient resting comfortably when seen awaiting procedure for IR.  Noted palliative medicine team continuing to follow along with patient's medical journey.  Objective:   Vital Signs:  BP (!) 100/59   Pulse 70   Temp (!) 97.5 F (36.4 C) (Oral)   Resp (!) 21   Ht 5' 11 (1.803 m)   Wt 78.9 kg   SpO2 97%   BMI 24.26 kg/m   Physical Exam: General: NAD, alert, chronically ill-appearing Cardiovascular: Bradycardia noted Respiratory: No increased increased work of breathing noted, not in respiratory distress Neuro: Awake Psych: Anxious  Assessment & Plan:   Assessment: Patient is a 53 year old female with a past medical history of metastatic breast cancer with disease to bone/pancreas/peritoneum and complicated by duodenal obstruction requiring PEG tube placement, hypertension, hyperlipidemia, SVT, DVT, asthma, and anxiety/depression who was admitted on 04/01/2024 for nausea and vomiting.  Patient had recently been admitted from 9/29-10/8 for management as well.  During admission, patient required transfer to ICU for management of presumed septic shock in the setting of  Klebsiella bacteremia, tachycardia with history of SVT, and intestinal obstruction.  Nephrology and oncology consulted for recommendations during hospitalization.  Palliative medicine team consulted to assist with complex medical decision making and symptom management.   Of note patient known to PMT from outpatient visits.  Recommendations/Plan: # Complex medical decision making/goals of care:  - Multiple providers have had extensive discussion with patient and family during hospitalization.  Has already been discussed by oncologist that patient is not a candidate for further cancer directed therapies due to overall functional status and multiple medical comorbidities.  Was already explained that any additional chemotherapy treatments would not offer survival advantage, would in fact likely decrease survival.  Patient and family continue to have difficulty processing this information.  Have already introduced idea of hospice support at home and what would and would not be provided.  Patient does have difficult home situation with lack of stable housing and support for care at home.  Continues to have difficulties processing the severity of her underlying illness.  Palliative medicine team continuing to engage in conversations as able and appropriate moving forward.  -  Code Status: Full Code   - Have already discussed CODE STATUS with patient and already expressed concern that if patient were sick enough her heart were to stop or she would have stopped breathing, interventions such as cardiac resuscitation and intubation with mechanical ventilation would not lead to good quality of life outcomes and would not reverse her underlying metastatic cancer.  Have recommended patient consider change of CODE STATUS to DNR/DNI.  Discussed that if patient does remain full code, would  fall to her children to make decision about palliative extubation in setting of metastatic cancer at end-of-life.  # Symptom management    - Pain, acute on chronic in setting of metastatic breast cancer   - Continue as needed oral Dilaudid  1-2 mg every 3 hours as needed   - Continue as needed IV Dilaudid  1 mg every 3 hours   - Continue fentanyl  transdermal 50 mcg every 72 hour  # Psychosocial Support:  - Patient's parents, 2 sons, 2 daughters listed in EMR  # Discharge Planning: To Be Determined  Discussed with: Patient, bedside RN, PCCM provider  Thank you for allowing the palliative care team to participate in the care Chelsea Hansen.  Tinnie Radar, DO Palliative Care Provider PMT # [phone]  If patient remains symptomatic despite maximum doses, please call PMT at [phone] between 0700 and 1900. Outside of these hours, please call attending, as PMT does not have night coverage.

## 2024-04-12 NOTE — Progress Notes (Signed)
 Chelsea Hansen [address] [facility] liaison note:   This is a current patient with [facility], followed for outpatient palliative care.   [facility] will continue to follow for discharge disposition.   Please call with any outpatient palliative or hospice related questions or concerns.   Thank you, Eleanor Nail, LPN [phone]

## 2024-04-12 NOTE — Plan of Care (Signed)
  Problem: Clinical Measurements: Goal: Respiratory complications will improve Outcome: Progressing Goal: Cardiovascular complication will be avoided Outcome: Progressing   Problem: Activity: Goal: Risk for activity intolerance will decrease Outcome: Progressing   Problem: Coping: Goal: Level of anxiety will decrease Outcome: Not Progressing   Problem: Pain Managment: Goal: General experience of comfort will improve and/or be controlled Outcome: Not Progressing

## 2024-04-13 ENCOUNTER — Encounter (HOSPITAL_COMMUNITY): Payer: Self-pay

## 2024-04-13 DIAGNOSIS — E878 Other disorders of electrolyte and fluid balance, not elsewhere classified: Secondary | ICD-10-CM | POA: Diagnosis not present

## 2024-04-13 LAB — BASIC METABOLIC PANEL WITH GFR
Anion gap: 10 (ref 5–15)
BUN: 9 mg/dL (ref 6–20)
CO2: 24 mmol/L (ref 22–32)
Calcium: 6.6 mg/dL — ABNORMAL LOW (ref 8.9–10.3)
Chloride: 104 mmol/L (ref 98–111)
Creatinine, Ser: 0.38 mg/dL — ABNORMAL LOW (ref 0.44–1.00)
GFR, Estimated: >60 mL/min (ref >60)
Glucose, Bld: 130 mg/dL — ABNORMAL HIGH (ref 70–99)
Potassium: 3.8 mmol/L (ref 3.5–5.1)
Sodium: 138 mmol/L (ref 135–145)

## 2024-04-13 LAB — CALCIUM, IONIZED: Calcium, Ionized, Serum: 3.7 mg/dL — ABNORMAL LOW (ref 4.5–5.6)

## 2024-04-13 LAB — PHOSPHORUS: Phosphorus: 2.2 mg/dL — ABNORMAL LOW (ref 2.5–4.6)

## 2024-04-13 LAB — CBC
HCT: 28.1 % — ABNORMAL LOW (ref 36.0–46.0)
Hemoglobin: 8.9 g/dL — ABNORMAL LOW (ref 12.0–15.0)
MCH: 26.3 pg (ref 26.0–34.0)
MCHC: 31.7 g/dL (ref 30.0–36.0)
MCV: 83.1 fL (ref 80.0–100.0)
Platelets: 43 K/uL — ABNORMAL LOW (ref 150–400)
RBC: 3.38 MIL/uL — ABNORMAL LOW (ref 3.87–5.11)
RDW: 16.6 % — ABNORMAL HIGH (ref 11.5–15.5)
WBC: 9.1 K/uL (ref 4.0–10.5)
nRBC: 0.2 % (ref 0.0–0.2)

## 2024-04-13 LAB — MRSA NEXT GEN BY PCR, NASAL: MRSA by PCR Next Gen: NOT DETECTED

## 2024-04-13 LAB — MAGNESIUM: Magnesium: 2.4 mg/dL (ref 1.7–2.4)

## 2024-04-13 MED ORDER — ADULT MULTIVITAMIN W/MINERALS CH: 1.0000 | ORAL_TABLET | Freq: Every day | ORAL | Status: DC

## 2024-04-13 MED ORDER — DOCUSATE SODIUM 50 MG/5ML PO LIQD
200.0000 mg | Freq: Every day | ORAL | Status: DC
Start: 2024-04-13 — End: 2024-04-16
  Filled 2024-04-13: qty 20

## 2024-04-13 MED ORDER — MAGNESIUM OXIDE -MG SUPPLEMENT 400 (240 MG) MG PO TABS: 400.0000 mg | ORAL_TABLET | Freq: Two times a day (BID) | ORAL | Status: DC

## 2024-04-13 MED ORDER — CALCITRIOL 0.25 MCG PO CAPS: 0.2500 ug | ORAL_CAPSULE | Freq: Two times a day (BID) | ORAL | Status: DC

## 2024-04-13 MED ORDER — OLANZAPINE 10 MG PO TBDP
5.0000 mg | ORAL_TABLET | Freq: Every day | ORAL | Status: DC
  Administered 2024-04-13: 5 mg
  Filled 2024-04-13: qty 1

## 2024-04-13 MED ORDER — ADULT MULTIVITAMIN W/MINERALS CH
1.0000 | ORAL_TABLET | Freq: Every day | ORAL | Status: DC
  Administered 2024-04-13: 1

## 2024-04-13 MED ORDER — POTASSIUM PHOSPHATES 15 MMOLE/5ML IV SOLN
30.0000 mmol | Freq: Once | INTRAVENOUS | Status: AC
  Administered 2024-04-13: 30 mmol via INTRAVENOUS
  Filled 2024-04-13: qty 10

## 2024-04-13 MED ORDER — K PHOS MONO-SOD PHOS DI & MONO 155-852-130 MG PO TABS: 250.0000 mg | ORAL_TABLET | Freq: Three times a day (TID) | ORAL | Status: DC

## 2024-04-13 MED ORDER — CALCITRIOL 1 MCG/ML PO SOLN
0.2500 ug | Freq: Two times a day (BID) | ORAL | Status: DC
  Administered 2024-04-13: 0.25 ug
  Filled 2024-04-13 (×7): qty 0.25

## 2024-04-13 MED ORDER — HYDROMORPHONE HCL 1 MG/ML PO LIQD: 1.0000 mg | ORAL | Status: DC | PRN

## 2024-04-13 MED ORDER — CALCITRIOL 0.25 MCG PO CAPS
0.2500 ug | ORAL_CAPSULE | Freq: Two times a day (BID) | ORAL | Status: DC
  Administered 2024-04-13: 0.25 ug
  Filled 2024-04-13 (×2): qty 1

## 2024-04-13 MED ORDER — CEFADROXIL 500 MG PO CAPS
1000.0000 mg | ORAL_CAPSULE | Freq: Two times a day (BID) | ORAL | Status: DC
  Filled 2024-04-13: qty 2

## 2024-04-13 MED ORDER — K PHOS MONO-SOD PHOS DI & MONO 155-852-130 MG PO TABS
250.0000 mg | ORAL_TABLET | Freq: Three times a day (TID) | ORAL | Status: DC
Start: 2024-04-13 — End: 2024-04-16
  Administered 2024-04-13 (×2): 250 mg
  Filled 2024-04-13 (×2): qty 1

## 2024-04-13 MED ORDER — CALCIUM CARBONATE ANTACID 1250 MG/5ML PO SUSP
2500.0000 mg | Freq: Three times a day (TID) | ORAL | Status: DC
  Administered 2024-04-13 (×2): 2500 mg
  Filled 2024-04-13 (×11): qty 25

## 2024-04-13 MED ORDER — MAGNESIUM OXIDE -MG SUPPLEMENT 400 (240 MG) MG PO TABS
400.0000 mg | ORAL_TABLET | Freq: Two times a day (BID) | ORAL | Status: DC
Start: 2024-04-13 — End: 2024-04-16
  Administered 2024-04-13 (×2): 400 mg
  Filled 2024-04-13: qty 1

## 2024-04-13 MED ORDER — CEFADROXIL 500 MG PO CAPS
1000.0000 mg | ORAL_CAPSULE | Freq: Two times a day (BID) | ORAL | Status: DC
Start: 2024-04-13 — End: 2024-04-14
  Administered 2024-04-13: 1000 mg
  Filled 2024-04-13 (×2): qty 2

## 2024-04-13 MED ORDER — CALCIUM CARBONATE ANTACID 1250 MG/5ML PO SUSP
2500.0000 mg | Freq: Three times a day (TID) | ORAL | Status: DC
  Filled 2024-04-13 (×2): qty 25

## 2024-04-13 MED ORDER — ERGOCALCIFEROL 200 MCG/ML PO SOLN
8000.0000 [IU] | Freq: Every day | ORAL | Status: DC
  Administered 2024-04-13: 8000 [IU]
  Filled 2024-04-13 (×4): qty 1

## 2024-04-13 MED ORDER — KATE FARMS STANDARD 1.4 PO LIQD
325.0000 mL | Freq: Two times a day (BID) | ORAL | Status: DC
  Filled 2024-04-13 (×10): qty 325

## 2024-04-13 MED ORDER — ERGOCALCIFEROL 200 MCG/ML PO SOLN: 8000.0000 [IU] | Freq: Every day | ORAL | Status: DC

## 2024-04-13 MED ORDER — ACETAMINOPHEN 325 MG PO TABS: 650.0000 mg | ORAL_TABLET | Freq: Four times a day (QID) | ORAL | Status: DC | PRN

## 2024-04-13 MED ORDER — DOCUSATE SODIUM 50 MG/5ML PO LIQD: 200.0000 mg | Freq: Every day | ORAL | Status: DC

## 2024-04-13 MED ORDER — SORBITOL 70 % SOLN
30.0000 mL | Freq: Every day | Status: DC
  Filled 2024-04-13: qty 30

## 2024-04-13 NOTE — Progress Notes (Signed)
 eLink Physician-Brief Progress Note Patient Name: VERN PRESTIA DOB: 12-29-1970 MRN: [id number]   Date of Service  04/13/2024  HPI/Events of Note  not VTE prophy ordered  eICU Interventions  Scds for now     Intervention Category Intermediate Interventions: Best-practice therapies (e.g. DVT, beta blocker, etc.)  Talyssa Gibas 04/13/2024, 1:20 AM

## 2024-04-13 NOTE — Progress Notes (Signed)
 Progress Note   Patient: Chelsea Hansen FMW:[id number] DOB: 1970/09/21 DOA: 04/01/2024     11 DOS: the patient was seen and examined on 04/13/2024   Brief hospital course: 53 year old lady with metastatic breast cancer including abdominal metastasis with duodenal obstruction with PEG tube placement admitted for pain and severe electrolyte derangements transferred to the ICU with SVT and hypotension. Now Weaned off vasopressors.  No longer requiring ICU level of care transitioned to TRH  Assessment and Plan: Septic shock due to Klebsiella bacteremia:  Suspect gut translocation or intra-abdominal, possibly skin and soft tissue with previous subcutaneous fluid collection although no comment on any worsening on repeat imaging this admission.   -- Repeat CT of the abdomen pelvis largely unchanged -- Continue IV antibiotics for Klebsiella, planned minimum 7 days -- MAP goal greater than 65, weaned off IV vasopressors -- Stress dose steroids, plan to start taper 10/23  SVT:  Improved  No underlying flutter or fib.   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 Secondary poor p.o. intake due to ongoing GI symptoms. Significant concern for patient's prognosis. Patient unable to tolerate tube feeding via tubes in the past. 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 Patient does not use G-tube for nutrition. Will monitor for improvement in symptoms. Appreciate palliative care consultation.   Acute on chronic abdominal pain related to cancer. Patient is on fentanyl  patch chronically.  Dose increased on 10/15 from 25-37.5. Also on as needed IV Dilaudid . He recently underwent celiac plexus block without any significant improvement.   Severe  hypokalemia, severe hypocalcemia, hypophosphatemia. Hypomagnesemia. Continue replacement    Goals of care conversation. Appreciate Palliative care  consultation. Currently full code.  Wants full code scope of treatment. Prognosis is poor though.  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 Left lower quadrant enterocutaneous fistula. S/p laparoscopic gastrojejunostomy bypass 01/02/24 by Dr. Lyndel Subsequently underwent G tube and J tube placement 01/10/24 Readmitted 8/14 for FTT and G and J tube malposition. G tube replaced by IR 8/15. J tube ultimately removed due to obstruction.  Eakins pouch in place, low output. Wound care consulted due to leaking.  Currently no evidence of active infection. not a candidate for surgical management of possible primary pancreatic malignancy in the setting of her metastatic breast cancer with carcinomatosis.  Has a venting G-tube.  Which is currently being used for medications.  Vitamin D  deficiency. Replacing with D2.  Bleeding from the enterocutaneous fistula. Patient has a jejunal fistula which led to some bleeding on the morning of 10/17. Discussed with general surgery, recommend close monitoring. Lovenox  held.  No further bleeding.  Lovenox  resumed on 10/18.       Subjective: Had no acute complaints. She wanted to clarify that she was a full code. Family thought she had limited resuscitation her chart. She was reassured that she was a full code.  Physical Exam: Gen: Chronically ill-appearing woman who is resting comfortably in bed  CVS: Regular rhythm, normal rate, S1 and S2 normal without murmur Resp: Diminished breath sounds over bases, poor inspiratory effort.  No rales/rhonchi Abd: Soft, flat, G-tube in situ with some tenderness around it.  Colostomy bag in place. Ext: No lower extremity edema  Imaging: Vitals:    04/13/24 0332 04/13/24 0400 04/13/24 0500 04/13/24 0600  BP: (!) 148/76 115/63 119/68 (!) 157/85  Pulse: 75 (!) 52 (!) 52 66  Resp: 13 10 10 13   Temp:   97.6 F (36.4 C)   TempSrc:   Axillary   SpO2: 98% 97% 96% 98%  Weight:   80.2 kg   Height:        Data Reviewed:    CBC    Component Value Date/Time   WBC 9.1 04/13/2024 0400   RBC 3.38 (L) 04/13/2024 0400   HGB 8.9 (L) 04/13/2024 0400   HGB 10.0 (L) 03/30/2024 0914   HGB 11.5 10/06/2020 1432   HCT 28.1 (L) 04/13/2024 0400   HCT 36.8 10/06/2020 1432   PLT 43 (L) 04/13/2024 0400   PLT 146 (L) 03/30/2024 0914   PLT 323 10/06/2020 1432   MCV 83.1 04/13/2024 0400   MCV 73 (L) 10/06/2020 1432   MCH 26.3 04/13/2024 0400   MCHC 31.7 04/13/2024 0400   RDW 16.6 (H) 04/13/2024 0400   RDW 15.9 (H) 10/06/2020 1432   LYMPHSABS 0.9 04/12/2024 0438   LYMPHSABS 1.9 10/06/2020 1432   MONOABS 1.1 (H) 04/12/2024 0438   EOSABS 0.0 04/12/2024 0438   EOSABS 0.1 10/06/2020 1432   BASOSABS 0.0 04/12/2024 0438   BASOSABS 0.0 10/06/2020 1432    CMP     Component Value Date/Time   NA 138 04/13/2024 0400   NA 144 11/30/2023 1000   K 3.8 04/13/2024 0400   CL 104 04/13/2024 0400   CO2 24 04/13/2024 0400   GLUCOSE 130 (H) 04/13/2024 0400   BUN 9 04/13/2024 0400   BUN 19 11/30/2023 1000   CREATININE 0.38 (L) 04/13/2024 0400   CREATININE 0.32 (L) 03/30/2024 0914   CALCIUM  6.6 (L) 04/13/2024 0400   PROT 5.2 (L) 04/10/2024 0717   PROT 7.3 11/30/2023 1000   ALBUMIN  3.0 (L) 04/10/2024 0717   ALBUMIN  4.5 11/30/2023 1000   AST 27 04/10/2024 0717   AST 14 (L) 03/30/2024 0914   ALT 29 04/10/2024 0717   ALT 22 03/30/2024 0914   ALKPHOS 470 (H) 04/10/2024 0717   BILITOT 1.1 04/10/2024 0717   BILITOT 0.5 03/30/2024 0914   EGFR 73 11/30/2023 1000   GFRNONAA >60 04/13/2024 0400   GFRNONAA >60 03/30/2024 9085    Family Communication: Daughter was on speaker phone while I was in her room  Disposition: Status is: Inpatient Remains  inpatient appropriate because:   Planned Discharge Destination: Home    Time spent: 35 minutes  Author: Landon FORBES Baller, MD 04/13/2024 8:30 AM  For on call review [email or web address].

## 2024-04-13 NOTE — Progress Notes (Signed)
   04/13/24 1110  Spiritual Encounters  Type of Visit Follow up  Care provided to: Patient  Reason for visit Advance directives   I provided follow up spiritual care support to assist Ms Mcjunkin and family with [facility] documents.  I spoke with Ms Verge who welcomed my visit. She was awake and alert. I answered and additional questions about advanced care planning and assisted in document completion. She phoned her daughter, Dena, who Ms Lashway designates as primary health care decision maker. Dena was involved in planning and asked questions regarding code status.  As we concluded document review, provider arrived and offered assurance to Ms Daoud of current full code status. I returned within about thirty minutes with the [facility], Dawn, RN and two witnesses. HCPOA was completed. This chaplain scanned to ACP documents and also placed copy in shadow chart. Will provide stamped originals and three copies to patient for family (receiving pewrsonal care at this time, so will deliver later today).  Carmin Dibartolo L. Delores HERO.Div

## 2024-04-14 ENCOUNTER — Inpatient Hospital Stay (HOSPITAL_COMMUNITY): Payer: MEDICAID

## 2024-04-14 DIAGNOSIS — R7881 Bacteremia: Secondary | ICD-10-CM | POA: Diagnosis not present

## 2024-04-14 DIAGNOSIS — D649 Anemia, unspecified: Secondary | ICD-10-CM | POA: Diagnosis not present

## 2024-04-14 DIAGNOSIS — G893 Neoplasm related pain (acute) (chronic): Secondary | ICD-10-CM | POA: Diagnosis not present

## 2024-04-14 DIAGNOSIS — E878 Other disorders of electrolyte and fluid balance, not elsewhere classified: Secondary | ICD-10-CM | POA: Diagnosis not present

## 2024-04-14 LAB — COMPREHENSIVE METABOLIC PANEL WITH GFR
ALT: 24 U/L (ref 0–44)
AST: 16 U/L (ref 15–41)
Albumin: 3 g/dL — ABNORMAL LOW (ref 3.5–5.0)
Alkaline Phosphatase: 365 U/L — ABNORMAL HIGH (ref 38–126)
Anion gap: 10 (ref 5–15)
BUN: 8 mg/dL (ref 6–20)
CO2: 26 mmol/L (ref 22–32)
Calcium: 6.6 mg/dL — ABNORMAL LOW (ref 8.9–10.3)
Chloride: 102 mmol/L (ref 98–111)
Creatinine, Ser: 0.36 mg/dL — ABNORMAL LOW (ref 0.44–1.00)
GFR, Estimated: >60 mL/min (ref >60)
Glucose, Bld: 135 mg/dL — ABNORMAL HIGH (ref 70–99)
Potassium: 3.9 mmol/L (ref 3.5–5.1)
Sodium: 138 mmol/L (ref 135–145)
Total Bilirubin: 0.4 mg/dL (ref 0.0–1.2)
Total Protein: 5.4 g/dL — ABNORMAL LOW (ref 6.5–8.1)

## 2024-04-14 LAB — PHOSPHORUS: Phosphorus: 2.1 mg/dL — ABNORMAL LOW (ref 2.5–4.6)

## 2024-04-14 LAB — MAGNESIUM: Magnesium: 2.4 mg/dL (ref 1.7–2.4)

## 2024-04-14 MED ORDER — ONDANSETRON HCL 4 MG/2ML IJ SOLN
4.0000 mg | Freq: Once | INTRAMUSCULAR | Status: AC
Start: 2024-04-14 — End: 2024-04-14
  Administered 2024-04-14: 4 mg via INTRAVENOUS
  Filled 2024-04-14: qty 2

## 2024-04-14 MED ORDER — CALCIUM GLUCONATE-NACL 2-0.675 GM/100ML-% IV SOLN
2.0000 g | Freq: Once | INTRAVENOUS | Status: AC
  Administered 2024-04-14: 2000 mg via INTRAVENOUS
  Filled 2024-04-14: qty 100

## 2024-04-14 MED ORDER — GERHARDT'S BUTT CREAM
TOPICAL_CREAM | Freq: Three times a day (TID) | CUTANEOUS | Status: DC
  Filled 2024-04-14: qty 60

## 2024-04-14 MED ORDER — DEXTROSE 5 % IV SOLN
30.0000 mmol | Freq: Once | INTRAVENOUS | Status: AC
  Administered 2024-04-14: 30 mmol via INTRAVENOUS
  Filled 2024-04-14: qty 10

## 2024-04-14 MED ORDER — SODIUM CHLORIDE 0.9 % IV SOLN
2.0000 g | INTRAVENOUS | Status: AC
  Administered 2024-04-14 – 2024-04-18 (×5): 2 g via INTRAVENOUS
  Filled 2024-04-14 (×6): qty 20

## 2024-04-14 MED ORDER — SODIUM CHLORIDE 0.9 % IV SOLN: INTRAVENOUS | Status: DC

## 2024-04-14 NOTE — Progress Notes (Signed)
 Gastrostomy Tube Leakage Issue  Start of shift, night shift gave report that gastrostomy tube was leaking heavily and night shift PA wanted day shift hospitalist to address issue.  Night shift PA was holding all PO meds through tube and feedings through tube until leakage was dealt with.  Night shift has been dressing current site with split gauze and ABD and old site opening with an ostomy bag.  The old site ostomy bag held drainage contents that were brown liquid.  The hospitalist consulted the on-call IR MD to ask them to come and evaluate the current gastrostomy site.  On 10/24, the gastrostomy tube was replaced from a 28 Fr to a 30 Fr tube.  However, the 30 Fr was leaking and the bumper stop was not flush with the abdomen.  Split gauze and ABD is the dressing collecting the drainage while waiting on the IR MD/PA to come evaluate the site.  Four dressing changes have been made prior to PA coming to the bedside.  PA came to bedside and filled the G-Tube balloon with 3 ml of saline and moved the bumper flush with the stomach.  She also put a single layer of split gauze behind the bumper and instructed that is all to be placed behind the bumper from now on.  Additional gauze and ABD may be placed on top of the tube to assist in drainage collection.  A new ostomy bag was changed at the old site while the IR PA was there and 300 ml of drainage was documented being in the bag.  The IR PA suggested that the patient be changed to a full liquid diet and the fluid restrictions be removed from the patient's diet.  This will help with bowel rest and help with tube movement to gravity drain.  RN contacted the hospitalist to get these orders.  Once the IR PA left, dressing changes with the single layer gauze, pack of gauze and ABD on top of tube became every 30 min because the drainage out of the tube became more liquid and free flowing.  Gerhardt's cream was gotten to help with wound care around tube site  because skin is becoming red and broken out.  RN told the patient to hold off ordering dinner tonight to give abdomen a rest.  PO meds by tube are also still being held off until tomorrow.  ABX have been switched to IVPB.  Delon Croak, RN

## 2024-04-14 NOTE — Plan of Care (Signed)
  Problem: Clinical Measurements: Goal: Respiratory complications will improve Outcome: Progressing Goal: Cardiovascular complication will be avoided Outcome: Progressing   Problem: Pain Managment: Goal: General experience of comfort will improve and/or be controlled Outcome: Progressing   Problem: Elimination: Goal: Will not experience complications related to bowel motility Outcome: Not Progressing

## 2024-04-14 NOTE — Plan of Care (Signed)
  Problem: Clinical Measurements: Goal: Will remain free from infection Outcome: Not Progressing Goal: Diagnostic test results will improve Outcome: Not Progressing Goal: Respiratory complications will improve Outcome: Progressing Goal: Cardiovascular complication will be avoided Outcome: Progressing   Problem: Nutrition: Goal: Adequate nutrition will be maintained Outcome: Not Progressing   Problem: Coping: Goal: Level of anxiety will decrease Outcome: Progressing   Problem: Elimination: Goal: Will not experience complications related to urinary retention Outcome: Progressing   Problem: Skin Integrity: Goal: Risk for impaired skin integrity will decrease Outcome: Not Progressing   Problem: Nutrition Goal: Nutritional status is improving Description: Monitor and assess patient for malnutrition (ex- brittle hair, bruises, dry skin, pale skin and conjunctiva, muscle wasting, smooth red tongue, and disorientation). Collaborate with interdisciplinary team and initiate plan and interventions as ordered.  Monitor patient's weight and dietary intake as ordered or per policy. Utilize nutrition screening tool and intervene per policy. Determine patient's food preferences and provide high-protein, high-caloric foods as appropriate.  Outcome: Not Progressing

## 2024-04-14 NOTE — Progress Notes (Addendum)
 Referring Physician(s): Dr. Maree  Supervising Physician: Vanice Revel  Patient Status:  [facility] - In-pt  Chief Complaint: Metastatic breast cancer, abdominal/duodenal obstruction  Subjective: Complaining of increased tube leakage.  Also leaking from ostomy site.  RN at bedside attempting to troubleshoot.  All report leakage has been difficult to manage.   Allergies: Enhertu  [fam-trastuzumab  deruxtec-nxki] and Xgeva  [denosumab ]  Medications: Prior to Admission medications   Medication Sig Start Date End Date Taking? Authorizing Provider  acetaminophen  (TYLENOL ) 325 MG tablet Take 2 tablets (650 mg total) by mouth every 6 (six) hours as needed for mild pain (pain score 1-3) or fever (or Fever >/= 101). 03/09/24  Yes Rosario Leatrice FERNS, MD  calcitRIOL  (ROCALTROL ) 0.25 MCG capsule Take 1 capsule (0.25 mcg total) by mouth 2 (two) times daily. 03/30/24  Yes Causey, Morna Pickle, NP  Calcium  Carb-Cholecalciferol  (CALCIUM  600+D) 600-10 MG-MCG TABS Take 1 tablet by mouth 3 (three) times daily with meals. 03/30/24  Yes Causey, Morna Pickle, NP  fentaNYL  (DURAGESIC ) 25 MCG/HR Place 1 patch onto the skin every 3 (three) days. 03/11/24  Yes Rosario Leatrice FERNS, MD  lidocaine -prilocaine  (EMLA ) cream Apply to affected area once Patient taking differently: Apply 1 Application topically daily as needed (for port). 02/08/24  Yes [name], [name], MD  magnesium  oxide (MAG-OX) 400 (240 Mg) MG tablet Take 1 tablet (400 mg total) by mouth 2 (two) times daily. 03/28/24 06/26/24 Yes [place], Eric J, DO  ondansetron  (ZOFRAN ) 8 MG tablet Take 1 tablet (8 mg total) by mouth every 8 (eight) hours as needed for nausea or vomiting. Start on the third day after chemotherapy. 02/08/24  Yes [name], [name], MD  oxyCODONE  (ROXICODONE ) 15 MG immediate release tablet Take 1 tablet (15 mg total) by mouth every 4 (four) hours as needed for pain. 03/29/24  Yes [name], [name], NP  prochlorperazine  (COMPAZINE ) 10  MG tablet Take 1 tablet (10 mg total) by mouth every 6 (six) hours as needed for nausea or vomiting. 02/08/24  Yes [name], MD     Vital Signs: BP 115/75 (BP Location: Right Arm)   Pulse 64   Temp (!) 97.5 F (36.4 C) (Axillary)   Resp 13   Ht 5' 11 (1.803 m)   Wt 176 lb 5.9 oz (80 kg)   SpO2 99%   BMI 24.60 kg/m   Physical Exam NAD, alert Abdomen: soft, 30Fr G-tube in place.  Bumper pulled back significant distance from the skin.  Liquid and thickened/chunky leakage around the tube. LLQ prior J-tube site draining similar combination fluid into an ostomy bag.  Small amount of drainage from underneath ostomy bag.   Imaging: DG Abd 1 View Result Date: 04/14/2024 EXAM: 1 VIEW XRAY OF THE ABDOMEN 04/14/2024 12:59:00 AM COMPARISON: None available. CLINICAL HISTORY: [id number] Gastrostomy tube dysfunction (HCC) [id number]. Gastrostomy tube dysfunction Gastrostomy tube dysfunction FINDINGS: LINES, TUBES AND DEVICES: PEG tube is seen in the left upper quadrant. Chest port is partially imaged. BOWEL: Nonobstructive bowel gas pattern. There is a moderate to large amount of stool throughout the colon. SOFT TISSUES: No opaque urinary calculi. BONES: No acute osseous abnormality. IMPRESSION: 1. No bowel obstruction. 2. Moderate to large amount of stool throughout the colon. Electronically signed by: Greig Pique MD 04/14/2024 01:06 AM EDT RP Workstation: [id number]   IR Replc Gastro/Colonic Tube Percut W/Fluoro Result Date: 04/12/2024 INDICATION: PEG tube replacement Leaking catheter. History of pancreatic mass with duodenal ureteral stricture/obstruction. Surgical gastrostomy and jejunostomy tubes placed 01/10/2024. EXAM: FLUOROSCOPIC GUIDED EXCHANGE OF  GASTROSTOMY TUBE COMPARISON:  CT CAP, 04/09/2024.  IR FLUOROSCOPY, 02/03/2024. MEDICATIONS: None. CONTRAST:  15mL OMNIPAQUE  IOHEXOL  300 MG/ML SOLN - administered into the gastric lumen FLUOROSCOPY: Radiation Exposure Index and estimated peak skin dose  (PSD); Reference air kerma (RAK), 2 mGy. COMPLICATIONS: None immediate. PROCEDURE: Informed written consent was obtained from the patient and/or patient's representative after a discussion of the risks, benefits and alternatives to treatment. Questions regarding the procedure were encouraged and answered. A timeout was performed prior to the initiation of the procedure. The upper abdomen and external portion of the existing gastrostomy tube was prepped and draped in the usual sterile fashion, and a sterile drape was applied covering the operative field. Maximum barrier sterile technique with sterile gowns and gloves were used for the procedure. A timeout was performed prior to the initiation of the procedure. The existing gastrostomy tube was injected with a small amount of contrast confirming appropriate positioning within the gastric lumen. The external portion of the gastrostomy tube was cut decompressing the retention balloon. Next, the gastrostomy tube was cannulated with a guidewire which was coiled within the gastric fundus. Over the stiff guide wire, the existing 28 Fr gastrostomy tube was exchanged for a new, slightly larger, 30 Fr balloon inflatable gastrostomy tube. The balloon was inflated with saline and dilute contrast and pulled against the anterior inner lumen of the stomach and the external disc was cinched. Contrast was injected and a post procedural spot fluoroscopic image was obtained confirming appropriate positioning and functionality of the new gastrostomy tube. A dressing was applied. The patient tolerated the procedure well without immediate postprocedural complication. IMPRESSION: Successful fluoroscopic-guided exchange and upsize for a new 30 Fr gastrostomy tube. The gastrostomy tube is ready for immediate use. RECOMMENDATIONS: The patient will return to V[facility]iology (VIR) for routine feeding tube evaluation and exchange in 6 months. Thom Hall, MD [facility] Electronically Signed   By: Thom Hall M.D.   On: 04/12/2024 13:22    Labs:  CBC: Recent Labs    04/10/24 0910 04/11/24 0459 04/11/24 1514 04/12/24 0438 04/13/24 0400  WBC 9.8 9.8  --  11.6* 9.1  HGB 7.6* 6.3* 8.2* 8.7* 8.9*  HCT 24.1* 19.4* 25.6* 26.1* 28.1*  PLT 63* 40*  --  39* 43*    COAGS: Recent Labs    12/10/23 2004 12/22/23 2233 02/02/24 0615 02/03/24 0337 02/27/24 1845 03/24/24 0030  INR 1.1   < > 1.2 1.2 1.2 1.1  APTT 28  --   --   --   --   --    < > = values in this interval not displayed.    BMP: Recent Labs    04/11/24 0459 04/12/24 0438 04/13/24 0400 04/14/24 0458  NA 123* 134* 138 138  K 3.3* 3.5 3.8 3.9  CL 90* 103 104 102  CO2 21* 22 24 26   GLUCOSE 267* 160* 130* 135*  BUN 10 8 9 8   CALCIUM  5.4* 6.6* 6.6* 6.6*  CREATININE 0.32* 0.40* 0.38* 0.36*  GFRNONAA >60 >60 >60 >60    LIVER FUNCTION TESTS: Recent Labs    03/30/24 0914 04/01/24 1825 04/04/24 0255 04/08/24 0240 04/09/24 0309 04/10/24 0717 04/14/24 0458  BILITOT 0.5 0.4  --   --   --  1.1 0.4  AST 14* 22  --   --   --  27 16  ALT 22 23  --   --   --  29 24  ALKPHOS  893* 809*  --   --   --  470* 365*  PROT 5.9* 5.7*  --   --   --  5.2* 5.4*  ALBUMIN  3.2* 3.3*   < > 3.4* 3.3* 3.0* 3.0*   < > = values in this interval not displayed.    Assessment and Plan: Metastatic breast cancer with duodenal obstruction Patient had a prior J-tube in place with significant leakage and has since been removed.  Due to significant skin breakdown, an ostomy appliance has been added over what is now an enterocutaneous fistula.  This will likely take time to close down due to skin breakdown vs. Persistence.   Her G-tube has been exchanged and upsized for a 30 Fr tube.   She is eating for comfort which adds to the difficulty in managing an obstruction as she now has undigested food which may intermittently be clogging the tube. Will be a  delicate balance of moderating her intake and managing her tubes and prior J-tube site to minimize further issues with leakage and skin deterioration.   At bedside with RN: -Replaced the bumper and cinched as tightly as patient could tolerate.  -Filled G-tube balloon with additional 3 mL of saline and ensure this was tightly abutting the stomach with bumper in good position.  -Flushed G-tube to ensure it was not obstructed with solid food.  -Placed A SINGLE LAYER of gauze underneath the bumper so as not encourage distancing.  -RN to replace ostomy device to make sure no leakage underneath.   Recommendations: -Butt paste/barrier cream as able around the G-tube site.  -Minimize dressing or layers of gauze. Ok to place dressing over top of the tube, but none underneath bumper.  -Intermittent trial of bowel rest or liquid diet-- patient is currently on a fluid restriction.  RN to address with MD.   Expectations: Patient has advanced metastatic cancer causing an intestinal obstruction.  She has had finicky and poorly functional tubes for several months.  As her tubes have been exchanged and upsized this has allowed for a larger hole for bowel contents to leak through.  Doubtful her leakage will ever be zero.  Goal is to minimize and contain while allowing her the most liberalized diet that can accomplish comfort in both PO intake as well as wound management.  This was discussed with the patient and RN.   IR remains available.   Electronically Signed: Aarohi Redditt Sue-Ellen Amyre Segundo, PA 04/14/2024, 3:25 PM   I spent a total of 25 Minutes at the the patient's bedside AND on the patient's hospital floor or unit, greater than 50% of which was counseling/coordinating care for duodenal obstruction, leaking G-tube.

## 2024-04-14 NOTE — Progress Notes (Signed)
 Progress Note   Patient: Chelsea Hansen FMW:[id number] DOB: February 24, 1971 DOA: 04/01/2024     12 DOS: the patient was seen and examined on 04/14/2024   Brief hospital course: 53 year old lady with metastatic breast cancer including abdominal metastasis with duodenal obstruction with PEG tube placement admitted for pain and severe electrolyte derangements transferred to the ICU with SVT and hypotension. Now Weaned off vasopressors.  No longer requiring ICU level of care transitioned to TRH  10/25: Leak around G-tube site.  Meds on hold.  IR PA will evaluate the site.  Switching oral antibiotic to IV Rocephin .  Abdominal x-ray showing stool but no other pathology  Assessment and Plan: Septic shock due to Klebsiella bacteremia:  Suspect gut translocation or intra-abdominal, possibly skin and soft tissue with previous subcutaneous fluid collection although no comment on any worsening on repeat imaging this admission.   -- Repeat CT of the abdomen pelvis largely unchanged -- Continue IV antibiotics for Klebsiella-IV Rocephin , planned minimum 7 days -- MAP goal greater than 65, weaned off IV vasopressors -- Stress dose steroids,-IV Solu-Cortef 100 mg every 8 hours.  Will start tapering tomorrow  SVT:  Improved  No underlying flutter or fib.   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 Secondary poor p.o. intake due to ongoing GI symptoms. Significant concern for patient's prognosis. Patient unable to tolerate tube feeding via tubes in the past. 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 holding all PO tube meds and feedings until the sites are evaluated. The new G-tube is also leaking at the tube site and that is where the PO meds by tube came out last  night from yesterday's placement. The old site has the ostomy bag.  Patient does not use G-tube for nutrition. Will monitor for improvement in symptoms. Appreciate palliative care consultation.   Acute on chronic abdominal pain related to cancer. Patient is on fentanyl  patch chronically.  Dose increased on 10/15 from 25-37.5. Also on as needed IV Dilaudid . He recently underwent celiac plexus block without any significant improvement.   Severe hypokalemia, severe hypocalcemia, hypophosphatemia. Hypomagnesemia. Pharmacy managing this   Goals of care conversation. Appreciate Palliative care  consultation. Currently full code.  Wants full code scope of treatment. Prognosis is poor though.  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 Left lower quadrant enterocutaneous fistula. S/p laparoscopic gastrojejunostomy bypass 01/02/24 by Dr. Lyndel Subsequently underwent G tube and J tube placement 01/10/24 Readmitted 8/14 for FTT and G and J tube malposition. G tube replaced by IR 8/15.-It is leaking.  Noted by nursing on 10/25.  IR PA to reevaluate the site J tube ultimately removed due to obstruction.  Eakins pouch in place, low output. Wound care consulted due to leaking.  Currently no evidence of active infection.  IR PA will evaluate this by tomorrow per radiologist not a candidate for surgical management of possible primary pancreatic malignancy in the setting of her metastatic breast cancer with carcinomatosis.  Has a venting G-tube.  Which is currently being used for medications.  Vitamin D  deficiency. Replacing with D2.  Bleeding from the enterocutaneous fistula. Patient has a jejunal fistula which led to some bleeding on the morning of 10/17. Discussed with general surgery, recommend  close monitoring. Lovenox  held.  No further bleeding.  Lovenox  resumed on  10/18.       Subjective: Leak around G-tube site.  Physical Exam: Gen: Chronically ill-appearing woman who is resting comfortably in bed  CVS: Regular rhythm, normal rate, S1 and S2 normal without murmur Resp: Diminished breath sounds over bases, poor inspiratory effort.  No rales/rhonchi Abd: Soft, flat, G-tube in situ with some tenderness and leak around it.  Colostomy bag in place. Ext: No lower extremity edema   Imaging: Vitals:   04/14/24 0900 04/14/24 1100 04/14/24 1200 04/14/24 1223  BP: 108/84 117/67 115/75   Pulse: 65 62 64   Resp: 14 11 13    Temp:    (!) 97.5 F (36.4 C)  TempSrc:    Axillary  SpO2: 98% 98% 99%   Weight:      Height:        Data Reviewed:    CBC    Component Value Date/Time   WBC 9.1 04/13/2024 0400   RBC 3.38 (L) 04/13/2024 0400   HGB 8.9 (L) 04/13/2024 0400   HGB 10.0 (L) 03/30/2024 0914   HGB 11.5 10/06/2020 1432   HCT 28.1 (L) 04/13/2024 0400   HCT 36.8 10/06/2020 1432   PLT 43 (L) 04/13/2024 0400   PLT 146 (L) 03/30/2024 0914   PLT 323 10/06/2020 1432   MCV 83.1 04/13/2024 0400   MCV 73 (L) 10/06/2020 1432   MCH 26.3 04/13/2024 0400   MCHC 31.7 04/13/2024 0400   RDW 16.6 (H) 04/13/2024 0400   RDW 15.9 (H) 10/06/2020 1432   LYMPHSABS 0.9 04/12/2024 0438   LYMPHSABS 1.9 10/06/2020 1432   MONOABS 1.1 (H) 04/12/2024 0438   EOSABS 0.0 04/12/2024 0438   EOSABS 0.1 10/06/2020 1432   BASOSABS 0.0 04/12/2024 0438   BASOSABS 0.0 10/06/2020 1432    CMP     Component Value Date/Time   NA 138 04/14/2024 0458   NA 144 11/30/2023 1000   K 3.9 04/14/2024 0458   CL 102 04/14/2024 0458   CO2 26 04/14/2024 0458   GLUCOSE 135 (H) 04/14/2024 0458   BUN 8 04/14/2024 0458   BUN 19 11/30/2023 1000   CREATININE 0.36 (L) 04/14/2024 0458   CREATININE 0.32 (L) 03/30/2024 0914   CALCIUM  6.6 (L) 04/14/2024 0458   PROT 5.4 (L) 04/14/2024 0458   PROT 7.3 11/30/2023 1000   ALBUMIN  3.0 (L) 04/14/2024 0458   ALBUMIN  4.5 11/30/2023 1000   AST  16 04/14/2024 0458   AST 14 (L) 03/30/2024 0914   ALT 24 04/14/2024 0458   ALT 22 03/30/2024 0914   ALKPHOS 365 (H) 04/14/2024 0458   BILITOT 0.4 04/14/2024 0458   BILITOT 0.5 03/30/2024 0914   EGFR 73 11/30/2023 1000   GFRNONAA >60 04/14/2024 0458   GFRNONAA >60 03/30/2024 9085    Family Communication: Daughter was on speaker phone while I was in her room  Disposition: Status is: Inpatient Remains inpatient appropriate because:   Planned Discharge Destination: Home    Time spent: 35 minutes  Author: Cresencio Fairly, MD 04/14/2024 1:03 PM  For on call review [email or web address].

## 2024-04-15 DIAGNOSIS — Z515 Encounter for palliative care: Secondary | ICD-10-CM | POA: Diagnosis not present

## 2024-04-15 DIAGNOSIS — E878 Other disorders of electrolyte and fluid balance, not elsewhere classified: Secondary | ICD-10-CM | POA: Diagnosis not present

## 2024-04-15 DIAGNOSIS — C7889 Secondary malignant neoplasm of other digestive organs: Secondary | ICD-10-CM | POA: Diagnosis not present

## 2024-04-15 DIAGNOSIS — R7881 Bacteremia: Secondary | ICD-10-CM | POA: Diagnosis not present

## 2024-04-15 DIAGNOSIS — Z7189 Other specified counseling: Secondary | ICD-10-CM | POA: Diagnosis not present

## 2024-04-15 LAB — CBC WITH DIFFERENTIAL/PLATELET
Abs Immature Granulocytes: 0.4 K/uL — ABNORMAL HIGH (ref 0.00–0.07)
Basophils Absolute: 0 K/uL (ref 0.0–0.1)
Basophils Relative: 0 %
Eosinophils Absolute: 0 K/uL (ref 0.0–0.5)
Eosinophils Relative: 0 %
HCT: 28 % — ABNORMAL LOW (ref 36.0–46.0)
Hemoglobin: 8.9 g/dL — ABNORMAL LOW (ref 12.0–15.0)
Lymphocytes Relative: 12 %
Lymphs Abs: 0.9 K/uL (ref 0.7–4.0)
MCH: 26.6 pg (ref 26.0–34.0)
MCHC: 31.8 g/dL (ref 30.0–36.0)
MCV: 83.8 fL (ref 80.0–100.0)
Metamyelocytes Relative: 2 %
Monocytes Absolute: 0.5 K/uL (ref 0.1–1.0)
Monocytes Relative: 7 %
Myelocytes: 3 %
Neutro Abs: 5.4 K/uL (ref 1.7–7.7)
Neutrophils Relative %: 76 %
Platelets: 67 K/uL — ABNORMAL LOW (ref 150–400)
RBC: 3.34 MIL/uL — ABNORMAL LOW (ref 3.87–5.11)
RDW: 16.6 % — ABNORMAL HIGH (ref 11.5–15.5)
WBC: 7.1 K/uL (ref 4.0–10.5)
nRBC: 0.7 % — ABNORMAL HIGH (ref 0.0–0.2)

## 2024-04-15 LAB — BASIC METABOLIC PANEL WITH GFR
Anion gap: 9 (ref 5–15)
BUN: 8 mg/dL (ref 6–20)
CO2: 26 mmol/L (ref 22–32)
Calcium: 6.4 mg/dL — CL (ref 8.9–10.3)
Chloride: 101 mmol/L (ref 98–111)
Creatinine, Ser: 0.38 mg/dL — ABNORMAL LOW (ref 0.44–1.00)
GFR, Estimated: >60 mL/min (ref >60)
Glucose, Bld: 141 mg/dL — ABNORMAL HIGH (ref 70–99)
Potassium: 3.9 mmol/L (ref 3.5–5.1)
Sodium: 136 mmol/L (ref 135–145)

## 2024-04-15 LAB — MAGNESIUM: Magnesium: 1.9 mg/dL (ref 1.7–2.4)

## 2024-04-15 LAB — PHOSPHORUS: Phosphorus: 2.3 mg/dL — ABNORMAL LOW (ref 2.5–4.6)

## 2024-04-15 MED ORDER — LACTATED RINGERS IV SOLN
INTRAVENOUS | Status: AC
Start: 2024-04-15 — End: 2024-04-16

## 2024-04-15 MED ORDER — HYDROMORPHONE HCL 1 MG/ML IJ SOLN
2.0000 mg | INTRAMUSCULAR | Status: DC | PRN
Start: 2024-04-15 — End: 2024-04-19
  Administered 2024-04-15 – 2024-04-19 (×19): 2 mg via INTRAVENOUS
  Filled 2024-04-15 (×19): qty 2

## 2024-04-15 MED ORDER — FENTANYL 75 MCG/HR TD PT72
1.0000 | MEDICATED_PATCH | TRANSDERMAL | Status: DC
  Administered 2024-04-17: 1 via TRANSDERMAL
  Filled 2024-04-15: qty 1

## 2024-04-15 MED ORDER — CALCIUM GLUCONATE-NACL 2-0.675 GM/100ML-% IV SOLN
2.0000 g | Freq: Once | INTRAVENOUS | Status: AC
  Administered 2024-04-15: 2000 mg via INTRAVENOUS
  Filled 2024-04-15: qty 100

## 2024-04-15 MED ORDER — FENTANYL 25 MCG/HR TD PT72: 1.0000 | MEDICATED_PATCH | TRANSDERMAL | Status: DC

## 2024-04-15 MED ORDER — MAGNESIUM SULFATE 2 GM/50ML IV SOLN
2.0000 g | Freq: Once | INTRAVENOUS | Status: AC
  Administered 2024-04-15: 2 g via INTRAVENOUS
  Filled 2024-04-15: qty 50

## 2024-04-15 MED ORDER — DEXTROSE 5 % IV SOLN
30.0000 mmol | Freq: Once | INTRAVENOUS | Status: AC
  Administered 2024-04-15: 30 mmol via INTRAVENOUS
  Filled 2024-04-15: qty 10

## 2024-04-15 NOTE — Progress Notes (Signed)
 Daily Progress Note   Patient Name: Chelsea Hansen       Date: 04/15/2024 DOB: 09/16/1970  Age: 53 y.o. MRN#: [id number] Attending Physician: Dana Ivan Masse* Primary Care Physician: [name], NP Admit Date: 04/01/2024 Length of Stay: 13 days  Reason for Consultation/Follow-up: Establishing goals of care  Subjective:   Reviewed EMR including recent documentation from hospitalist.  Patient continues to have leak around G-tube site and also having leaking from ostomy site.  Continuing antibiotic management.  IR evaluated patient and attempting adjustments to help manage difficult to control leakage.  IR noted to is doubtful that patient's leakage will ever be zero.  Patient not receiving medications via PEG tube.  Personally reviewed abdominal x-ray from 04/14/2024 noting moderate to large amount of stool through the colon.  Patient has not had a BM at this time. At time of EMR review in past 24 hours patient has received as needed IV Dilaudid  1 mg x 3 doses.  Discussed care with bedside RN for medical updates.  Hospitalist has already changed patient's pain medication to fentanyl  75 mcg every 72 hour transdermal patch today and changed IV Dilaudid  to 2 mg every 3 hours as needed.   Patient planning to speak with chaplain to adjust her ACP documentation regarding HCPOA as was felt children were not placed in the correct order.  Patient wanting to talk to oncologist tomorrow about prognosis.  After discussion with hospitalist and RN, palliative medicine provider will not be making adjustments on pain medications or bowel regimen.  Deferring to hospitalist who is already adjusting medications.  Do not want multiple providers adjusting medications which could lead to adverse effects.  Did express monitoring patient's blood pressure and mentation closely since these have previously been affected by opioids.  Objective:   Vital Signs:  BP 125/62 (BP Location: Right Arm)   Pulse  60   Temp 97.8 F (36.6 C) (Oral)   Resp 10   Ht 5' 11 (1.803 m)   Wt 81.1 kg   SpO2 98%   BMI 24.94 kg/m   Assessment & Plan:   Assessment: Patient is a 53 year old female with a past medical history of metastatic breast cancer with disease to bone/pancreas/peritoneum and complicated by duodenal obstruction requiring PEG tube placement, hypertension, hyperlipidemia, SVT, DVT, asthma, and anxiety/depression who was admitted on 04/01/2024 for nausea and vomiting.  Patient had recently been admitted from 9/29-10/8 for management as well.  During admission, patient required transfer to ICU for management of presumed septic shock in the setting of Klebsiella bacteremia, tachycardia with history of SVT, and intestinal obstruction.  Nephrology and oncology consulted for recommendations during hospitalization.  Palliative medicine team consulted to assist with complex medical decision making and symptom management.   Of note patient known to PMT from outpatient visits.  Recommendations/Plan: # Complex medical decision making/goals of care:  - Multiple providers have had extensive discussion with patient and family during hospitalization.  Has already been discussed by oncologist that patient is not a candidate for further cancer directed therapies due to overall functional status and multiple medical comorbidities.  Was already explained that any additional chemotherapy treatments would not offer survival advantage, would in fact likely decrease survival.  This provider already discussed with patient while her parents were at bedside idea of involving hospice.  Patient has been reluctant to adjust medical care from anything but full code, full aggressive medical measures despite multiple providers recommending transition to comfort focused care.  Patient can follow-up  with oncologist tomorrow who she is willing to engage with in conversations.  Palliative medicine team continuing to engage in  conversations as able and appropriate moving forward.  -  Code Status: Full Code   - Have already discussed CODE STATUS with patient and already expressed concern that if patient were sick enough her heart were to stop or she would have stopped breathing, interventions such as cardiac resuscitation and intubation with mechanical ventilation would not lead to good quality of life outcomes and would not reverse her underlying metastatic cancer.  Have recommended patient consider change of CODE STATUS to DNR/DNI.  Discussed that if patient does remain full code, would fall to her children to make decision about palliative extubation in setting of metastatic cancer at end-of-life.  # Symptom management  As per hospitalist  # Psychosocial Support:  - Patient's parents, 2 sons, 2 daughters listed in EMR  # Discharge Planning: To Be Determined  Thank you for allowing the palliative care team to participate in the care Shona CHRISTELLA Gainer.  Tinnie Radar, DO Palliative Care Provider PMT # [phone]  If patient remains symptomatic despite maximum doses, please call PMT at [phone] between 0700 and 1900. Outside of these hours, please call attending, as PMT does not have night coverage.

## 2024-04-15 NOTE — Progress Notes (Signed)
 PROGRESS NOTE    Chelsea Hansen  FMW:[id number]  DOB: 1971-04-24  DOA: 04/01/2024 PCP: [name], NP Outpatient Specialists:   Hospital course:  53 year old lady with metastatic breast cancer with widespread abdominal metastases and peritoneum and small intestine with duodenal obstruction s/p with J-tube placement was admitted with septic shock due to Klebsiella bacteremia severe electrolyte derangements with SVT and hypotension requiring ICU care with IV pressors.  Course has been complicated by SVT patient did well with treatment with IV fluid resuscitation, pressors and antibiotics and was transferred out of the ICU.  Course has been complicated by ongoing leak around G-tube site with inability to take any p.o.'s or to use G-tube.  Patient has been seen by oncology who note that there are no further treatment options.  Patient is being closely followed by palliative care with discussion of goals for care.  Family and patient continue to feel hopeful and hope that she is going to live a long time.  Subjective:  Patient's main concern today was ongoing abdominal pain.  She notes Dilaudid  1 mg barely touches her pain.  Notes that she goes from 10 to an 8 but it last less than 1 hour.  She noted that the 2 mg dose brought her pain down to a 4 and lasted 3 to 4 hours.  Patient also concerned about ongoing leakage from G-tube site despite manipulation by IR 2 days ago.  RN and patient note that even if she eats anything and if they use the G-tube at all, there is immediate leakage from the site.  RN notes that patient tried to eat a hamburger on the lettuce and the meat was coming out of the side of the G-tube.  Objective: Vitals:   04/15/24 1036 04/15/24 1106 04/15/24 1200 04/15/24 1600  BP:   125/62 (!) 150/89  Pulse: (!) 107 70 60 66  Resp: (!) 23 10 10 14   Temp:   97.8 F (36.6 C)   TempSrc:   Oral   SpO2: 100% 97% 98% 99%  Weight:      Height:        Intake/Output  Summary (Last 24 hours) at 04/15/2024 1717 Last data filed at 04/15/2024 1653 Gross per 24 hour  Intake 1723.32 ml  Output --  Net 1723.32 ml   Filed Weights   04/13/24 0500 04/14/24 0500 04/15/24 0500  Weight: 80.2 kg 80 kg 81.1 kg     Exam:  General: Uncomfortable somewhat teary appearing female sitting up in bed listening to gospel music on her iPhone. Eyes: sclera anicteric, conjuctiva mild injection bilaterally CVS: S1-S2, regular  Respiratory:  decreased air entry bilaterally secondary to decreased inspiratory effort, rales at bases  GI: G-tube site with oozing and leaking and wet bandage surrounding it.  Bumper is pulled out. LE: Warm and well-perfused Neuro: A/O x 3,  grossly nonfocal.  Psych: patient is sad, uncomfortable, has very poor insight and is in denial regarding her prognosis..  Data Reviewed:  Basic Metabolic Panel: Recent Labs  Lab 04/11/24 0459 04/12/24 0438 04/13/24 0400 04/14/24 0458 04/15/24 0425  NA 123* 134* 138 138 136  K 3.3* 3.5 3.8 3.9 3.9  CL 90* 103 104 102 101  CO2 21* 22 24 26 26   GLUCOSE 267* 160* 130* 135* 141*  BUN 10 8 9 8 8   CREATININE 0.32* 0.40* 0.38* 0.36* 0.38*  CALCIUM  5.4* 6.6* 6.6* 6.6* 6.4*  MG 1.9 2.5* 2.4 2.4 1.9  PHOS 1.9* 1.9* 2.2*  2.1* 2.3*    CBC: Recent Labs  Lab 04/10/24 0910 04/11/24 0459 04/11/24 1514 04/12/24 0438 04/13/24 0400 04/15/24 0425  WBC 9.8 9.8  --  11.6* 9.1 7.1  NEUTROABS 8.4* 9.0*  --  9.4*  --  5.4  HGB 7.6* 6.3* 8.2* 8.7* 8.9* 8.9*  HCT 24.1* 19.4* 25.6* 26.1* 28.1* 28.0*  MCV 82.5 80.8  --  82.3 83.1 83.8  PLT 63* 40*  --  39* 43* 67*     Scheduled Meds:  calcitRIOL   0.25 mcg Per Tube BID   calcium  carbonate (dosed in mg elemental calcium )  2,500 mg of elemental calcium  Per Tube TID BM   Chlorhexidine  Gluconate Cloth  6 each Topical Daily   docusate  200 mg Per Tube Daily   ergocalciferol  (VITAMIN D2)  8,000 Units Per Tube Daily   feeding supplement (KATE FARMS STANDARD 1.4)   325 mL Per Tube BID BM   [START ON 04/17/2024] fentaNYL   1 patch Transdermal Q72H   Gerhardt's butt cream   Topical TID   hydrocortisone sod succinate (SOLU-CORTEF) inj  100 mg Intravenous Q8H   magnesium  oxide  400 mg Per Tube BID   multivitamin with minerals  1 tablet Per Tube Daily   OLANZapine  zydis  5 mg Per Tube QHS   pantoprazole  (PROTONIX ) IV  40 mg Intravenous Q24H   phosphorus  250 mg Per Tube TID   sodium chloride  flush  10-40 mL Intracatheter Q12H   sorbitol   30 mL Per Tube Q0600   Continuous Infusions:  sodium chloride  40 mL/hr at 04/15/24 1653   calcium  gluconate Stopped (04/15/24 1357)   cefTRIAXone  (ROCEPHIN )  IV Stopped (04/15/24 1508)   potassium PHOSPHATE  IVPB (in mmol) 85 mL/hr at 04/15/24 1653     Assessment & Plan:   Septic shock due to Klebsiella bacteremia Thought to be secondary to gut translocation versus soft tissue infection with previous subcutaneous fluid collection.  UA without evidence of UTI.  Continue ceftriaxone  for minimum of 7 days.  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  Patient is not a candidate for surgical management.  Per discussion with IR, it would be best if patient could be n.p.o. to give the site a chance to dry out.  Discussed with the patient, she is willing to try to be n.p.o. starting tonight.  We had a long conversation about her nutritional status and that it would be very difficult for her to heal the G-tube site given low protein given poor nutritional status and inability to either eat or use G-tube site.   Trial of n.p.o. for 24 hours to see if the G-tube site heals up.  Pain management Palliative care Goals for care As noted above, patient states Dilaudid  1 mg is ineffective for her. Will  increase fentanyl  patch to 75 mcg/day Increase Dilaudid  to 2 mg every 3 hours but would like to decrease back to 1 mg if possible once the fentanyl  has kicked in to a steady state. Palliative care has been managing her pain except for my management today. Discussions about goals for care ongoing.  Family and patient feel that patient would like to live and that she will live.  Continue discussion about widely metastatic disease with no further treatment options and protein malnutrition. Palliative care continues to visit her daily.  Dr. Clayton has long-term relationship with patient and family.  Patient remains full care.  Bilateral metastatic breast CA, ER/PR positive, HER2 negative Patient with no further treatment options per oncology  SVT Resolved  Bleeding from enterocutaneous fistula Lovenox  held with resolution of bleeding. Lovenox  resumed on 10/18   DVT prophylaxis: Lovenox  Code Status: Full, ongoing discussions in progress for GOC Family Communication: None today     Studies: DG Abd 1 View Result Date: 04/14/2024 EXAM: 1 VIEW XRAY OF THE ABDOMEN 04/14/2024 12:59:00 AM COMPARISON: None available. CLINICAL HISTORY: [id number] Gastrostomy tube dysfunction (HCC) [id number]. Gastrostomy tube dysfunction Gastrostomy tube dysfunction FINDINGS: LINES, TUBES AND DEVICES: PEG tube is seen in the left upper quadrant. Chest port is partially imaged. BOWEL: Nonobstructive bowel gas pattern. There is a moderate to large amount of stool throughout the colon. SOFT TISSUES: No opaque urinary calculi. BONES: No acute osseous abnormality. IMPRESSION: 1. No bowel obstruction. 2. Moderate to large amount of stool throughout the colon. 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

## 2024-04-15 NOTE — Plan of Care (Signed)
  Problem: Clinical Measurements: Goal: Respiratory complications will improve Outcome: Progressing Goal: Cardiovascular complication will be avoided Outcome: Progressing   Problem: Activity: Goal: Risk for activity intolerance will decrease Outcome: Progressing   Problem: Elimination: Goal: Will not experience complications related to urinary retention Outcome: Progressing   Problem: Clinical Measurements: Goal: Ability to maintain clinical measurements within normal limits will improve Outcome: Not Progressing Goal: Diagnostic test results will improve Outcome: Not Progressing   Problem: Nutrition: Goal: Adequate nutrition will be maintained Outcome: Not Progressing   Problem: Coping: Goal: Level of anxiety will decrease Outcome: Not Progressing   Problem: Elimination: Goal: Will not experience complications related to bowel motility Outcome: Not Progressing   Problem: Pain Managment: Goal: General experience of comfort will improve and/or be controlled Outcome: Not Progressing   Problem: Skin Integrity: Goal: Risk for impaired skin integrity will decrease Outcome: Not Progressing   Problem: Nutrition Goal: Nutritional status is improving Description: Monitor and assess patient for malnutrition (ex- brittle hair, bruises, dry skin, pale skin and conjunctiva, muscle wasting, smooth red tongue, and disorientation). Collaborate with interdisciplinary team and initiate plan and interventions as ordered.  Monitor patient's weight and dietary intake as ordered or per policy. Utilize nutrition screening tool and intervene per policy. Determine patient's food preferences and provide high-protein, high-caloric foods as appropriate.  Outcome: Not Progressing

## 2024-04-15 NOTE — Progress Notes (Signed)
 Progress Note 04/15/24  Patient's G-tube bumper was out again.  IR PA instructed nurse to push bumper against the abdomen and put a single split gauze behind the bumper and dress the top of the bumper accordingly to handle the leakage.  The hospitalist spoke with the patient about her pain management.  1 mg of dilaudid  was not helping the patient with her pain.  Previously when she was on the floor, she was getting 2 mg dilaudid  and that helped  more with her pain.  She is at a 10 out of 10 and it only goes to an 8 on 1 mg of dilaudid .  The hospitalist agreed to go back up to 2 mg of dilaudid .  The hopsitalist also put in orders for the patient's fentanyl  pouch to go up to 75 mcg from 25 mcg on 10/28 when the next change of the pouch is due.  When this happens, the patient should go back down to 1 mg of dilaudid  according to the hopsitalst.    The wound care around the G-tube is as follows:  bumper tube at the abdomen, single split gauze against the abdomen behind the bumper tube.  Pack of 4x4 gauze on top of the bumper to catch leakage, cut ABD pad with a split and wrap around the tubing to gravity drain bag and tape the split to hold the bumper against the abdomen and tape at the bottom to keep drainage on the gauze and ABD.  The old site opening is being dressed with an ostomy bag.  The goal of care for tonight is to go NPO by mouth for 24 hours and nothing through the G-tube for 24 hours.  No ice chips, no liquids, no medications except IV medications for 24 hours.  Mouth care and mouth swabs for oral hydration is acceptable every 2 hours.  This is to see if there is any leakage out of the G-tube and to give the patient bowel rest.   The patient also needs to speak with her oncologist Dr. Odean tomorrow.  If day shift can let the hospitalist attending tomorrow know in order to get in contact or secure chat Dr. [name] that would be appreciative.    Delon Croak, RN

## 2024-04-15 NOTE — Progress Notes (Signed)
   04/15/24 1200  Spiritual Encounters  Type of Visit Initial  Care provided to: Pt and family  Conversation partners present during Programmer, Systems   Provided paperwork and education to staff and patient for completing AD.

## 2024-04-15 NOTE — Plan of Care (Signed)
  Problem: Clinical Measurements: Goal: Respiratory complications will improve Outcome: Progressing   Problem: Clinical Measurements: Goal: Cardiovascular complication will be avoided Outcome: Not Progressing   Problem: Activity: Goal: Risk for activity intolerance will decrease Outcome: Not Progressing   Problem: Nutrition: Goal: Adequate nutrition will be maintained Outcome: Not Progressing   Problem: Elimination: Goal: Will not experience complications related to bowel motility Outcome: Not Progressing   Problem: Pain Managment: Goal: General experience of comfort will improve and/or be controlled Outcome: Not Progressing   Problem: Skin Integrity: Goal: Risk for impaired skin integrity will decrease Outcome: Not Progressing   Problem: Nutrition Goal: Nutritional status is improving Description: Monitor and assess patient for malnutrition (ex- brittle hair, bruises, dry skin, pale skin and conjunctiva, muscle wasting, smooth red tongue, and disorientation). Collaborate with interdisciplinary team and initiate plan and interventions as ordered.  Monitor patient's weight and dietary intake as ordered or per policy. Utilize nutrition screening tool and intervene per policy. Determine patient's food preferences and provide high-protein, high-caloric foods as appropriate.  Outcome: Not Progressing

## 2024-04-16 ENCOUNTER — Other Ambulatory Visit: Payer: Self-pay

## 2024-04-16 ENCOUNTER — Ambulatory Visit: Payer: MEDICAID | Admitting: Gastroenterology

## 2024-04-16 ENCOUNTER — Encounter (HOSPITAL_COMMUNITY): Payer: Self-pay | Admitting: Radiology

## 2024-04-16 DIAGNOSIS — R7881 Bacteremia: Secondary | ICD-10-CM | POA: Diagnosis not present

## 2024-04-16 DIAGNOSIS — C799 Secondary malignant neoplasm of unspecified site: Secondary | ICD-10-CM | POA: Diagnosis not present

## 2024-04-16 DIAGNOSIS — Z515 Encounter for palliative care: Secondary | ICD-10-CM | POA: Diagnosis not present

## 2024-04-16 DIAGNOSIS — C7889 Secondary malignant neoplasm of other digestive organs: Secondary | ICD-10-CM | POA: Diagnosis not present

## 2024-04-16 DIAGNOSIS — Z931 Gastrostomy status: Secondary | ICD-10-CM | POA: Diagnosis not present

## 2024-04-16 DIAGNOSIS — E878 Other disorders of electrolyte and fluid balance, not elsewhere classified: Secondary | ICD-10-CM | POA: Diagnosis not present

## 2024-04-16 DIAGNOSIS — C50411 Malignant neoplasm of upper-outer quadrant of right female breast: Secondary | ICD-10-CM

## 2024-04-16 DIAGNOSIS — Z7189 Other specified counseling: Secondary | ICD-10-CM | POA: Diagnosis not present

## 2024-04-16 DIAGNOSIS — C50919 Malignant neoplasm of unspecified site of unspecified female breast: Secondary | ICD-10-CM | POA: Diagnosis not present

## 2024-04-16 LAB — COMPREHENSIVE METABOLIC PANEL WITH GFR
ALT: 31 U/L (ref 0–44)
AST: 18 U/L (ref 15–41)
Albumin: 3.1 g/dL — ABNORMAL LOW (ref 3.5–5.0)
Alkaline Phosphatase: 388 U/L — ABNORMAL HIGH (ref 38–126)
Anion gap: 9 (ref 5–15)
BUN: 8 mg/dL (ref 6–20)
CO2: 27 mmol/L (ref 22–32)
Calcium: 6.2 mg/dL — CL (ref 8.9–10.3)
Chloride: 101 mmol/L (ref 98–111)
Creatinine, Ser: 0.39 mg/dL — ABNORMAL LOW (ref 0.44–1.00)
GFR, Estimated: >60 mL/min (ref >60)
Glucose, Bld: 143 mg/dL — ABNORMAL HIGH (ref 70–99)
Potassium: 3.9 mmol/L (ref 3.5–5.1)
Sodium: 137 mmol/L (ref 135–145)
Total Bilirubin: 0.4 mg/dL (ref 0.0–1.2)
Total Protein: 5.4 g/dL — ABNORMAL LOW (ref 6.5–8.1)

## 2024-04-16 LAB — MAGNESIUM: Magnesium: 1.9 mg/dL (ref 1.7–2.4)

## 2024-04-16 LAB — CBC
HCT: 28.5 % — ABNORMAL LOW (ref 36.0–46.0)
Hemoglobin: 9 g/dL — ABNORMAL LOW (ref 12.0–15.0)
MCH: 26.6 pg (ref 26.0–34.0)
MCHC: 31.6 g/dL (ref 30.0–36.0)
MCV: 84.3 fL (ref 80.0–100.0)
Platelets: 82 K/uL — ABNORMAL LOW (ref 150–400)
RBC: 3.38 MIL/uL — ABNORMAL LOW (ref 3.87–5.11)
RDW: 16.8 % — ABNORMAL HIGH (ref 11.5–15.5)
WBC: 8.7 K/uL (ref 4.0–10.5)
nRBC: 0.7 % — ABNORMAL HIGH (ref 0.0–0.2)

## 2024-04-16 LAB — PHOSPHORUS: Phosphorus: 2.4 mg/dL — ABNORMAL LOW (ref 2.5–4.6)

## 2024-04-16 MED ORDER — MAGNESIUM OXIDE -MG SUPPLEMENT 400 (240 MG) MG PO TABS
400.0000 mg | ORAL_TABLET | Freq: Two times a day (BID) | ORAL | Status: DC
  Administered 2024-04-18: 400 mg via ORAL
  Filled 2024-04-16 (×3): qty 1

## 2024-04-16 MED ORDER — CALCIUM CARBONATE ANTACID 1250 MG/5ML PO SUSP
2500.0000 mg | Freq: Three times a day (TID) | ORAL | Status: DC
  Filled 2024-04-16 (×7): qty 25

## 2024-04-16 MED ORDER — POTASSIUM PHOSPHATES 15 MMOLE/5ML IV SOLN
30.0000 mmol | Freq: Once | INTRAVENOUS | Status: AC
  Administered 2024-04-16: 30 mmol via INTRAVENOUS
  Filled 2024-04-16: qty 10

## 2024-04-16 MED ORDER — ERGOCALCIFEROL 200 MCG/ML PO SOLN
8000.0000 [IU] | Freq: Every day | ORAL | Status: DC
  Administered 2024-04-19: 8000 [IU] via ORAL
  Filled 2024-04-16 (×3): qty 1

## 2024-04-16 MED ORDER — ACETAMINOPHEN 325 MG PO TABS: 650.0000 mg | ORAL_TABLET | Freq: Four times a day (QID) | ORAL | Status: DC | PRN

## 2024-04-16 MED ORDER — CALCIUM GLUCONATE-NACL 2-0.675 GM/100ML-% IV SOLN
2.0000 g | Freq: Three times a day (TID) | INTRAVENOUS | Status: DC
  Administered 2024-04-16 – 2024-04-19 (×9): 2000 mg via INTRAVENOUS
  Filled 2024-04-16 (×12): qty 100

## 2024-04-16 MED ORDER — HYDROMORPHONE HCL 1 MG/ML PO LIQD
1.0000 mg | ORAL | Status: DC | PRN
  Administered 2024-04-19: 2 mg via ORAL
  Filled 2024-04-16: qty 2

## 2024-04-16 MED ORDER — ADULT MULTIVITAMIN W/MINERALS CH
1.0000 | ORAL_TABLET | Freq: Every day | ORAL | Status: DC
  Administered 2024-04-18: 1 via ORAL
  Filled 2024-04-16 (×2): qty 1

## 2024-04-16 MED ORDER — CALCITRIOL 1 MCG/ML PO SOLN
0.2500 ug | Freq: Two times a day (BID) | ORAL | Status: DC
  Administered 2024-04-18 – 2024-04-19 (×2): 0.25 ug via ORAL
  Filled 2024-04-16 (×7): qty 0.25

## 2024-04-16 MED ORDER — OLANZAPINE 10 MG PO TBDP
5.0000 mg | ORAL_TABLET | Freq: Every day | ORAL | Status: DC
  Filled 2024-04-16: qty 0.5

## 2024-04-16 MED ORDER — K PHOS MONO-SOD PHOS DI & MONO 155-852-130 MG PO TABS
250.0000 mg | ORAL_TABLET | Freq: Three times a day (TID) | ORAL | Status: DC
  Administered 2024-04-18: 250 mg via ORAL
  Filled 2024-04-16 (×10): qty 1

## 2024-04-16 MED ORDER — SORBITOL 70 % SOLN
30.0000 mL | Freq: Every day | Status: DC
  Filled 2024-04-16 (×3): qty 30

## 2024-04-16 NOTE — Plan of Care (Signed)
  Problem: Education: Goal: Knowledge of General Education information will improve Description: Including pain rating scale, medication(s)/side effects and non-pharmacologic comfort measures Outcome: Progressing   Problem: Health Behavior/Discharge Planning: Goal: Ability to manage health-related needs will improve Outcome: Progressing   Problem: Clinical Measurements: Goal: Ability to maintain clinical measurements within normal limits will improve Outcome: Progressing Goal: Will remain free from infection Outcome: Progressing Goal: Diagnostic test results will improve Outcome: Progressing Goal: Respiratory complications will improve Outcome: Progressing Goal: Cardiovascular complication will be avoided Outcome: Progressing   Problem: Activity: Goal: Risk for activity intolerance will decrease Outcome: Progressing   Problem: Nutrition: Goal: Adequate nutrition will be maintained Outcome: Progressing   Problem: Coping: Goal: Level of anxiety will decrease Outcome: Progressing   Problem: Elimination: Goal: Will not experience complications related to bowel motility Outcome: Progressing Goal: Will not experience complications related to urinary retention Outcome: Progressing   Problem: Pain Managment: Goal: General experience of comfort will improve and/or be controlled Outcome: Progressing   Problem: Safety: Goal: Ability to remain free from injury will improve Outcome: Progressing   Problem: Skin Integrity: Goal: Risk for impaired skin integrity will decrease Outcome: Progressing   Problem: Nutrition Goal: Nutritional status is improving Description: Monitor and assess patient for malnutrition (ex- brittle hair, bruises, dry skin, pale skin and conjunctiva, muscle wasting, smooth red tongue, and disorientation). Collaborate with interdisciplinary team and initiate plan and interventions as ordered.  Monitor patient's weight and dietary intake as ordered or per  policy. Utilize nutrition screening tool and intervene per policy. Determine patient's food preferences and provide high-protein, high-caloric foods as appropriate.  Outcome: Progressing

## 2024-04-16 NOTE — Progress Notes (Signed)
 Contacted pharmacy, they will change any meds they are able to change from tube to PO liquid. Patient's G-tube is connected to drainage bag and is leaking per report. Patient is on soft diet currently.

## 2024-04-16 NOTE — Progress Notes (Signed)
 Daily Progress Note   Patient Name: Chelsea Hansen       Date: 04/16/2024 DOB: 1971-04-15  Age: 53 y.o. MRN#: [id number] Attending Physician: Lenon Marien CROME, MD Primary Care Physician: [name], NP Admit Date: 04/01/2024 Length of Stay: 14 days  Reason for Consultation/Follow-up: Establishing goals of care  Subjective:   Reviewed EMR including recent documentation from hospitalist.  Patient continues to have ongoing leakage from G-tube site despite IR interventions.  IR recommended n.p.o. status to attempt bowel rest. Discussed care with hospitalist, oncologist, and bedside RN to coordinate care today.  Oncologist going to follow-up with patient/family as has long established relationship with patient.  Hospitalist reaching out to surgery for evaluation.  Patient has still not had a bowel movement. Hospitalist managing pain and constipation meds with the changes made 04/15/2024.  Patient appears ill and still struggling to process her terminal illness.  Objective:   Vital Signs:  BP (!) 142/75   Pulse (!) 57   Temp (!) 97.1 F (36.2 C) (Axillary) Comment: temp would not register oral  Resp (!) 7   Ht 5' 11 (1.803 m)   Wt 81 kg   SpO2 97%   BMI 24.91 kg/m   Assessment & Plan:   Assessment: Patient is a 53 year old female with a past medical history of metastatic breast cancer with disease to bone/pancreas/peritoneum and complicated by duodenal obstruction requiring PEG tube placement, hypertension, hyperlipidemia, SVT, DVT, asthma, and anxiety/depression who was admitted on 04/01/2024 for nausea and vomiting.  Patient had recently been admitted from 9/29-10/8 for management as well.  During admission, patient required transfer to ICU for management of presumed septic shock in the setting of Klebsiella bacteremia, tachycardia with history of SVT, and intestinal obstruction.  Nephrology and oncology consulted for recommendations during hospitalization.  Palliative  medicine team consulted to assist with complex medical decision making and symptom management.   Of note patient known to PMT from outpatient visits.  Recommendations/Plan: # Complex medical decision making/goals of care:  - Multiple providers have had extensive discussion with patient and family during hospitalization.  Oncologist has already informed patient that she is not a candidate for further cancer directed therapies due to overall functional status and multiple medical comorbidities.  Was already explained that any additional chemotherapy treatments would not offer survival advantage, would in fact likely decrease survival.  This provider already discussed with patient while her parents were at bedside idea of involving hospice.  Patient has been reluctant to adjust medical care from anything but full code, full aggressive medical measures despite multiple providers recommending transition to comfort focused care.  Oncologist to follow-up with patient and family today since has long established relationship with them.  Palliative medicine team continuing to engage in conversations as appropriate moving forward.  -  Code Status: Full Code   - Multiple providers have already discussed CODE STATUS with patient and already expressed concern that if patient were sick enough her heart were to stop or she would have stopped breathing, interventions such as cardiac resuscitation and intubation with mechanical ventilation would not lead to good quality of life outcomes and would not reverse her underlying metastatic cancer.  Have recommended patient consider change of CODE STATUS to DNR/DNI.  Discussed that if patient does remain full code, would fall to her children to make decision about palliative extubation in setting of metastatic cancer at end-of-life.  # Symptom management  As per hospitalist  # Psychosocial Support:  - Patient's parents, 2  sons, 2 daughters listed in EMR  # Discharge Planning:  To Be Determined  Thank you for allowing the palliative care team to participate in the care Shona CHRISTELLA Gainer.  Tinnie Radar, DO Palliative Care Provider PMT # [phone]  If patient remains symptomatic despite maximum doses, please call PMT at [phone] between 0700 and 1900. Outside of these hours, please call attending, as PMT does not have night coverage.

## 2024-04-16 NOTE — Progress Notes (Signed)
 PROGRESS NOTE Chelsea Hansen  FMW:[id number]  DOB: [birth year]  DOA: 04/01/2024 PCP: Chelsea Rosaline SQUIBB, NP  Brief Hospital course: Chelsea Hansen is a 53 year old lady with metastatic breast cancer with widespread abdominal metastases and peritoneum and small intestine with duodenal obstruction s/p with J-tube placement was admitted with septic shock due to Klebsiella bacteremia severe electrolyte derangements with SVT and hypotension requiring ICU care with IV pressors briefly.  Course has been complicated by ongoing leak around G-tube site with inability to take any p.o.'s or to use G-tube.  Patient has been seen by oncology who note that there are no further treatment options.   Patient is being closely followed by palliative care with discussion of goals for care.  Family and patient continue to feel hopeful and hope that she is going to live a long time. At this time she is wanting to talk with hospice.   Subjective: Pt reports feeling well today. Minimal abdominal pain. Has not been taking anything by mouth since yesterday other than small amount of mouthwash this morning.  She would like follow up with her surgeon.   Objective: Vitals:   04/16/24 0400 04/16/24 0500 04/16/24 0520 04/16/24 0600  BP: (!) 143/97 114/77  116/73  Pulse: 68 (!) 55 (!) 56 (!) 57  Resp: 17 (!) 7 (!) 7 (!) 7  Temp:      TempSrc:      SpO2: 100% 97% 96% 97%  Weight:  81 kg    Height:        Intake/Output Summary (Last 24 hours) at 04/16/2024 0714 Last data filed at 04/16/2024 0545 Gross per 24 hour  Intake 2722.85 ml  Output 50 ml  Net 2672.85 ml   Filed Weights   04/14/24 0500 04/15/24 0500 04/16/24 0500  Weight: 80 kg 81.1 kg 81 kg   Exam:  General: comfortable, NAD Eyes: sclera anicteric, conjuctiva mild injection bilaterally CVS: S1-S2, regular  Respiratory:  decreased air entry bilaterally secondary to decreased inspiratory effort, rales at bases  GI: G-tube site with erythema and  clear drainage,  LE: Warm and well-perfused Neuro: A/O x 3,  grossly nonfocal.  Psych: normal mood  Data Reviewed:  Basic Metabolic Panel: Recent Labs  Lab 04/12/24 0438 04/13/24 0400 04/14/24 0458 04/15/24 0425 04/16/24 0433  NA 134* 138 138 136 137  K 3.5 3.8 3.9 3.9 3.9  CL 103 104 102 101 101  CO2 22 24 26 26 27   GLUCOSE 160* 130* 135* 141* 143*  BUN 8 9 8 8 8   CREATININE 0.40* 0.38* 0.36* 0.38* 0.39*  CALCIUM  6.6* 6.6* 6.6* 6.4* 6.2*  MG 2.5* 2.4 2.4 1.9 1.9  PHOS 1.9* 2.2* 2.1* 2.3* 2.4*    CBC: Recent Labs  Lab 04/10/24 0910 04/11/24 0459 04/11/24 1514 04/12/24 0438 04/13/24 0400 04/15/24 0425 04/16/24 0433  WBC 9.8 9.8  --  11.6* 9.1 7.1 8.7  NEUTROABS 8.4* 9.0*  --  9.4*  --  5.4  --   HGB 7.6* 6.3* 8.2* 8.7* 8.9* 8.9* 9.0*  HCT 24.1* 19.4* 25.6* 26.1* 28.1* 28.0* 28.5*  MCV 82.5 80.8  --  82.3 83.1 83.8 84.3  PLT 63* 40*  --  39* 43* 67* 82*   Assessment & Plan:   Septic shock due to Klebsiella bacteremia- shock has resolved. Weaned off pressors. Transferred out of ICU.  Thought to be secondary to gut translocation versus soft tissue infection with previous subcutaneous fluid collection.  UA without evidence of UTI.  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  Patient is not a candidate for surgical management.  - Still having drainage today but has decreased.  - care instructions include keep tubing parallel with the skin, minimize dressing layers, none under bumper, barrier cream to surrounding skin - can likely restart comfort feeds today pending GOC discussions  Pain management Palliative care Goals for care - continue increased fentanyl  patch to 75 mcg/day - Dilaudid  to 2 mg every 3 hours but would  like to decrease back to 1 mg if possible once the fentanyl  has kicked in to a steady state. Palliative care has been managing her pain Discussions about goals for care ongoing.  Family and patient feel that patient would like to live and that she will live.  Continue discussion about widely metastatic disease with no further treatment options and protein malnutrition. Palliative care continues to visit her daily.  Dr. Clayton has long-term relationship with patient and family.  Patient remains full care.  Bilateral metastatic breast CA, ER/PR positive, HER2 negative Patient with no further treatment options per oncology  SVT Resolved  Bleeding from enterocutaneous fistula Lovenox  held with resolution of bleeding. Lovenox  resumed on 10/18  DVT prophylaxis: Lovenox  Code Status: Full, ongoing discussions in progress for GOC Family Communication: mom on speaker during our encounter  Chelsea Hansen, [facility]  If 7PM-7AM, please contact night-coverage [email or web address]   LOS: 14 days

## 2024-04-17 ENCOUNTER — Other Ambulatory Visit: Payer: Self-pay | Admitting: Hematology and Oncology

## 2024-04-17 ENCOUNTER — Encounter: Payer: Self-pay | Admitting: Hematology and Oncology

## 2024-04-17 DIAGNOSIS — E878 Other disorders of electrolyte and fluid balance, not elsewhere classified: Secondary | ICD-10-CM | POA: Diagnosis not present

## 2024-04-17 NOTE — Plan of Care (Signed)
   Problem: Coping: Goal: Level of anxiety will decrease Outcome: Progressing

## 2024-04-17 NOTE — Progress Notes (Addendum)
 PROGRESS NOTE Chelsea Hansen  FMW:[id number]  DOB: 03-Feb-1971  DOA: 04/01/2024 PCP: Chelsea Rosaline SQUIBB, NP  Brief Hospital course: Chelsea Hansen is a 53 year old lady with metastatic breast cancer with widespread abdominal metastases and peritoneum and small intestine with duodenal obstruction s/p with J-tube placement was admitted with septic shock due to Klebsiella bacteremia severe electrolyte derangements with SVT and hypotension requiring ICU care with IV pressors briefly.  Course has been complicated by ongoing leak around G-tube site with inability to take any p.o.'s or to use G-tube.  Patient has been seen by oncology who note that there are no further treatment options.   Patient is being closely followed by palliative care with discussion of goals for care.   At this time she is wanting to pursue home hospice, however still remains full code.   Subjective: Had extensive discussion with patient about her disposition today. She wants to pursue hospice at home in [place]. TOC has been consulted. Palliative care also involved. Starting to grasp the malignancy is the etiology of her PO intolerance and G-tube site issues, and there is nothing more to offer, therefore she will have to live with regurgitation and leakage. She still is holding onto hope for a miracle, since her cancer has reportedly gone into remission twice before. She understands there are no medical or surgical options, however still wants to try herbal remedies at home while on hospice.  Will need ongoing code status discussion. She wants to remain full code for accidents not related to the cancer such as a car accident which I have advised is not possible     Objective: Vitals:   04/16/24 1600 04/16/24 1953 04/17/24 0453 04/17/24 0500  BP: (!) 146/88 (!) 161/96 118/81   Pulse: (!) 53 67 66   Resp: (!) 8 16 16    Temp: 97.6 F (36.4 C) 98.6 F (37 C) 97.7 F (36.5 C)   TempSrc: Oral Oral Oral   SpO2: 98% 100% 95%    Weight:    81.2 kg  Height:        Intake/Output Summary (Last 24 hours) at 04/17/2024 1224 Last data filed at 04/16/2024 1430 Gross per 24 hour  Intake 1127.25 ml  Output --  Net 1127.25 ml   Filed Weights   04/15/24 0500 04/16/24 0500 04/17/24 0500  Weight: 81.1 kg 81 kg 81.2 kg   Exam:  General: comfortable, NAD Eyes: sclera anicteric, conjuctiva mild injection bilaterally CVS: S1-S2, regular  Respiratory:  no respiratory distress, equal chest excursion b/l  GI: G-tube site with minimal erythema, dressing CDI. Ostomy bag with minimal greenish brown liquid stool  LE: Warm and well-perfused Neuro: A/O x 3,  grossly nonfocal.  Psych: normal mood, poor insight   Data Reviewed:  Basic Metabolic Panel: Recent Labs  Lab 04/12/24 0438 04/13/24 0400 04/14/24 0458 04/15/24 0425 04/16/24 0433  NA 134* 138 138 136 137  K 3.5 3.8 3.9 3.9 3.9  CL 103 104 102 101 101  CO2 22 24 26 26 27   GLUCOSE 160* 130* 135* 141* 143*  BUN 8 9 8 8 8   CREATININE 0.40* 0.38* 0.36* 0.38* 0.39*  CALCIUM  6.6* 6.6* 6.6* 6.4* 6.2*  MG 2.5* 2.4 2.4 1.9 1.9  PHOS 1.9* 2.2* 2.1* 2.3* 2.4*    CBC: Recent Labs  Lab 04/11/24 0459 04/11/24 1514 04/12/24 0438 04/13/24 0400 04/15/24 0425 04/16/24 0433  WBC 9.8  --  11.6* 9.1 7.1 8.7  NEUTROABS 9.0*  --  9.4*  --  5.4  --   HGB 6.3* 8.2* 8.7* 8.9* 8.9* 9.0*  HCT 19.4* 25.6* 26.1* 28.1* 28.0* 28.5*  MCV 80.8  --  82.3 83.1 83.8 84.3  PLT 40*  --  39* 43* 67* 82*   Assessment & Plan:   Septic shock due to Klebsiella bacteremia- shock has resolved. Weaned off pressors. Transferred out of ICU.  Thought to be secondary to gut translocation versus soft tissue infection with previous subcutaneous fluid collection.  UA without evidence of UTI.  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  Patient is not a candidate for surgical management.  - care instructions include keep tubing parallel with the skin, minimize dressing layers, none under bumper, barrier cream to surrounding skin - can continue comfort feeds as tolerated  - monitor/replace electrolytes  - continue oral supplements as tolerated   Pain management Palliative care Goals for care - continue increased fentanyl  patch to 75 mcg/day - wean dilaudid  as tolerated  Palliative care has been managing her pain Discussions about goals for care ongoing.   Palliative care on board.  Dr. Clayton has long-term relationship with patient and family.  Patient remains full code but wishes to pursue home hospice per my discussions with her today.  TOC consulted   Bilateral metastatic breast CA, ER/PR positive, HER2 negative Patient with no further treatment options per oncology  SVT Resolved  Bleeding from enterocutaneous fistula Lovenox  held with resolution of bleeding. Lovenox  resumed on 10/18  DVT prophylaxis: Lovenox  Code Status: Full, ongoing discussions in progress for GOC Monitor/replace electrolytes   Time spent 55 minutes with GOC discussion with the patient    Daved JAYSON Pump, [facility]  If 7PM-7AM, please contact night-coverage [email or web address]   LOS: 15 days

## 2024-04-17 NOTE — Progress Notes (Signed)
 Patient's g-tube noted to be protuding more from her abdomen; patient had removed the drainage back from the G-tube; drainage bag replaced; abdominal dressing changed; ostomy pouch changed; MD notified; will continue to monitor

## 2024-04-17 NOTE — Progress Notes (Signed)
 I assisted to create an updated HCPOA (and living will) with Ms Chelsea Hansen on 04/13/2024. I attempted to scan document to ACP and also placed paper copy in shadow chart.  Upon visit today, 04/17/2024, Ms Chelsea Hansen expressed desire to change aspects of this document. I was not able to facilitate notarizing an additional version. However, I am flagging with comment to alert providers including hospice as this may be an area of concern if adult children have a conflict. It is unclear and a matter for further discussion that these rapid changes are being sought.  Concerns are 1) order of adult children who have surrogate primacy 2) details of living will, these may be addressed by MOST form, etc.  Annie Roseboom L. Delores HERO.Div

## 2024-04-17 NOTE — Progress Notes (Signed)
 I offered brief follow up support to Ms Chelsea Hansen, celebrating she has moved out of ICU.  Ms Chelsea Hansen welcomed my visit. She shared that, although there are some improvements, she is coping with prognosis that is unfavorable and that she was anticipating transition to hospice care. She shared that [facility] had visited but seemed confused as to services and next steps. She also shared that her phone was not functioning correctly.  I provided compassionate presence and active listening. I sought to understand needs and offered to facilitate a follow up contact from [facility]. I shared that they have chaplain services for ongoing support.  In spite of having updated HCPOA on 10/24, Ms Chelsea Hansen expressed concerns and desire to make changes again specifically on order of children prioritized for acting as surrogate. Although she seemed very clear and specific on those changes, the rapid change does bring some concern that warrants further discussion. Her brother, Chelsea Hansen arrived as our visit concluded, but I did not attempt to parse decision making with him but merely encouraged their time together. Placed call to [facility] at [facility] from my office, [phone], left HIPAA compliant message.  Chelsea Hansen L. Delores HERO.Div   04/17/24 1455  Spiritual Encounters  Type of Visit Follow up  Care provided to: Patient  Conversation partners present during encounter Other (comment) (brother arrived)  Reason for visit Routine spiritual support  Spiritual Framework  Presenting Themes Goals in life/care  Patient Stress Factors Major life changes  Interventions  Spiritual Care Interventions Made Compassionate presence;Other (comment) (facilitate follow up with [facility], [name])

## 2024-04-17 NOTE — Plan of Care (Signed)
  Problem: Education: Goal: Knowledge of General Education information will improve Description: Including pain rating scale, medication(s)/side effects and non-pharmacologic comfort measures Outcome: Progressing   Problem: Health Behavior/Discharge Planning: Goal: Ability to manage health-related needs will improve Outcome: Progressing   Problem: Clinical Measurements: Goal: Ability to maintain clinical measurements within normal limits will improve Outcome: Progressing Goal: Will remain free from infection Outcome: Progressing Goal: Diagnostic test results will improve Outcome: Progressing Goal: Respiratory complications will improve Outcome: Progressing Goal: Cardiovascular complication will be avoided Outcome: Progressing   Problem: Activity: Goal: Risk for activity intolerance will decrease Outcome: Progressing   Problem: Nutrition: Goal: Adequate nutrition will be maintained Outcome: Progressing   Problem: Coping: Goal: Level of anxiety will decrease Outcome: Progressing   Problem: Elimination: Goal: Will not experience complications related to bowel motility Outcome: Progressing Goal: Will not experience complications related to urinary retention Outcome: Progressing   Problem: Pain Managment: Goal: General experience of comfort will improve and/or be controlled Outcome: Progressing   Problem: Safety: Goal: Ability to remain free from injury will improve Outcome: Progressing   Problem: Skin Integrity: Goal: Risk for impaired skin integrity will decrease Outcome: Progressing   Problem: Nutrition Goal: Nutritional status is improving Description: Monitor and assess patient for malnutrition (ex- brittle hair, bruises, dry skin, pale skin and conjunctiva, muscle wasting, smooth red tongue, and disorientation). Collaborate with interdisciplinary team and initiate plan and interventions as ordered.  Monitor patient's weight and dietary intake as ordered or per  policy. Utilize nutrition screening tool and intervene per policy. Determine patient's food preferences and provide high-protein, high-caloric foods as appropriate.  Outcome: Progressing

## 2024-04-17 NOTE — Progress Notes (Addendum)
 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  Received request from oncology to meet with patient and review hospice services. Met with patient at bedside with mother on the phone to explain services and philosophy. Patient to determine if she will go home with her mother who lives in [place]  (which is outside of [facility] service area) or stay with her children here in [place].  Patient advised that she will need DME at discharge to include a hospital bed.   [facility] will continue to follow for discharge disposition.   Please call with any hospice or palliative related questions or concerns.   Thank you, Eleanor Nail, LPN [phone]

## 2024-04-17 NOTE — Progress Notes (Signed)
 Patient seen tugging at G-tube; redirected her, made a dressing change and provided hygiene; mental status changes noted; patient needs review of care and reinforcement of plans for care; will continue to monitor

## 2024-04-18 ENCOUNTER — Encounter: Payer: Self-pay | Admitting: General Practice

## 2024-04-18 ENCOUNTER — Other Ambulatory Visit: Payer: MEDICAID

## 2024-04-18 ENCOUNTER — Ambulatory Visit: Payer: MEDICAID | Admitting: Hematology and Oncology

## 2024-04-18 ENCOUNTER — Ambulatory Visit: Payer: MEDICAID

## 2024-04-18 DIAGNOSIS — E878 Other disorders of electrolyte and fluid balance, not elsewhere classified: Secondary | ICD-10-CM | POA: Diagnosis not present

## 2024-04-18 LAB — BASIC METABOLIC PANEL WITH GFR
Anion gap: 6 (ref 5–15)
Anion gap: 13 (ref 5–15)
BUN: 8 mg/dL (ref 6–20)
BUN: 7 mg/dL (ref 6–20)
CO2: 31 mmol/L (ref 22–32)
CO2: 24 mmol/L (ref 22–32)
Calcium: 6.2 mg/dL — CL (ref 8.9–10.3)
Calcium: 7 mg/dL — ABNORMAL LOW (ref 8.9–10.3)
Chloride: 99 mmol/L (ref 98–111)
Chloride: 99 mmol/L (ref 98–111)
Creatinine, Ser: <0.3 mg/dL — ABNORMAL LOW (ref 0.44–1.00)
Creatinine, Ser: 0.38 mg/dL — ABNORMAL LOW (ref 0.44–1.00)
GFR, Estimated: >60 mL/min (ref >60)
Glucose, Bld: 124 mg/dL — ABNORMAL HIGH (ref 70–99)
Glucose, Bld: 133 mg/dL — ABNORMAL HIGH (ref 70–99)
Potassium: 2.7 mmol/L — CL (ref 3.5–5.1)
Potassium: 3.2 mmol/L — ABNORMAL LOW (ref 3.5–5.1)
Sodium: 136 mmol/L (ref 135–145)
Sodium: 136 mmol/L (ref 135–145)

## 2024-04-18 LAB — CBC
HCT: 25.8 % — ABNORMAL LOW (ref 36.0–46.0)
Hemoglobin: 8.4 g/dL — ABNORMAL LOW (ref 12.0–15.0)
MCH: 27.6 pg (ref 26.0–34.0)
MCHC: 32.6 g/dL (ref 30.0–36.0)
MCV: 84.9 fL (ref 80.0–100.0)
Platelets: 97 K/uL — ABNORMAL LOW (ref 150–400)
RBC: 3.04 MIL/uL — ABNORMAL LOW (ref 3.87–5.11)
RDW: 16.7 % — ABNORMAL HIGH (ref 11.5–15.5)
WBC: 10.3 K/uL (ref 4.0–10.5)
nRBC: 1.2 % — ABNORMAL HIGH (ref 0.0–0.2)

## 2024-04-18 LAB — MAGNESIUM: Magnesium: 1.7 mg/dL (ref 1.7–2.4)

## 2024-04-18 MED ORDER — ENSURE PLUS HIGH PROTEIN PO LIQD: 237.0000 mL | Freq: Two times a day (BID) | ORAL | Status: DC

## 2024-04-18 MED ORDER — CALCIUM CARBONATE 1250 (500 CA) MG PO TABS
2500.0000 mg | ORAL_TABLET | Freq: Three times a day (TID) | ORAL | Status: DC
Start: 2024-04-18 — End: 2024-04-19
  Filled 2024-04-18 (×2): qty 5

## 2024-04-18 MED ORDER — POTASSIUM CHLORIDE 20 MEQ PO PACK: 40.0000 meq | PACK | Freq: Once | ORAL | Status: DC

## 2024-04-18 MED ORDER — POTASSIUM CHLORIDE 20 MEQ PO PACK
40.0000 meq | PACK | Freq: Once | ORAL | Status: AC
  Administered 2024-04-18: 40 meq via ORAL
  Filled 2024-04-18: qty 2

## 2024-04-18 MED ORDER — POTASSIUM CHLORIDE CRYS ER 20 MEQ PO TBCR
40.0000 meq | EXTENDED_RELEASE_TABLET | ORAL | Status: AC
  Administered 2024-04-18: 40 meq via ORAL
  Filled 2024-04-18 (×2): qty 2

## 2024-04-18 MED ORDER — POTASSIUM CHLORIDE 10 MEQ/100ML IV SOLN
10.0000 meq | INTRAVENOUS | Status: AC
  Administered 2024-04-18 (×4): 10 meq via INTRAVENOUS
  Filled 2024-04-18 (×4): qty 100

## 2024-04-18 MED ORDER — CALCIUM CARBONATE 1250 (500 CA) MG PO TABS: 2500.0000 mg | ORAL_TABLET | Freq: Three times a day (TID) | ORAL | Status: DC

## 2024-04-18 NOTE — Progress Notes (Signed)
 [facility] Liaison Note  Received request from [facility] for hospice services at home after discharge. Spoke with patient, mother and father to initiate education related to hospice philosophy, services and team approach to care. All verbalized understanding of information given. Per discussion, the plan is for discharge home possibly tomorrow.  DME needs discussed. Patient has the following equipment in the home: none. Family requests the following equipment for delivery: hospital bed, over bed table, wheelchair.  Please send signed and completed DNR home with patient/family. Please provide prescriptions at discharge as needed to ensure ongoing symptom management.  [facility] information and contact numbers given to patient and mother. Please call with any concerns.  Thank you for the opportunity to participate in this patient's care.   Eleanor Nail, LPN [facility] Liaison [phone]

## 2024-04-18 NOTE — Plan of Care (Signed)
  Problem: Education: Goal: Knowledge of General Education information will improve Description: Including pain rating scale, medication(s)/side effects and non-pharmacologic comfort measures Outcome: Progressing   Problem: Health Behavior/Discharge Planning: Goal: Ability to manage health-related needs will improve Outcome: Progressing   Problem: Clinical Measurements: Goal: Ability to maintain clinical measurements within normal limits will improve Outcome: Progressing Goal: Will remain free from infection Outcome: Progressing Goal: Diagnostic test results will improve Outcome: Progressing Goal: Respiratory complications will improve Outcome: Progressing Goal: Cardiovascular complication will be avoided Outcome: Progressing   Problem: Activity: Goal: Risk for activity intolerance will decrease Outcome: Progressing   Problem: Nutrition: Goal: Adequate nutrition will be maintained Outcome: Progressing   Problem: Coping: Goal: Level of anxiety will decrease Outcome: Progressing   Problem: Elimination: Goal: Will not experience complications related to bowel motility Outcome: Progressing Goal: Will not experience complications related to urinary retention Outcome: Progressing   Problem: Pain Managment: Goal: General experience of comfort will improve and/or be controlled Outcome: Progressing   Problem: Safety: Goal: Ability to remain free from injury will improve Outcome: Progressing   Problem: Skin Integrity: Goal: Risk for impaired skin integrity will decrease Outcome: Progressing   Problem: Nutrition Goal: Nutritional status is improving Description: Monitor and assess patient for malnutrition (ex- brittle hair, bruises, dry skin, pale skin and conjunctiva, muscle wasting, smooth red tongue, and disorientation). Collaborate with interdisciplinary team and initiate plan and interventions as ordered.  Monitor patient's weight and dietary intake as ordered or per  policy. Utilize nutrition screening tool and intervene per policy. Determine patient's food preferences and provide high-protein, high-caloric foods as appropriate.  Outcome: Progressing

## 2024-04-18 NOTE — Progress Notes (Signed)
 SPIRITUAL CARE AND COUNSELING CONSULT NOTE   VISIT SUMMARY Follow from [facility] side for continued Spiritual Care support. Keyondra values chaplain visits and prayer for reflection, meaning-making, and social support. Her current goals are to spend as much quality time with family as possible, including celebrating her birthday on [birth year] and traveling with family to her aunt's in [place]  for Thanksgiving.  SPIRITUAL ENCOUNTER                                                                                                                                                                      Type of Visit: Follow up Care provided to:: Patient Referral source: Patient request Reason for visit: Routine spiritual support   SPIRITUAL FRAMEWORK  Presenting Themes: Goals in life/care Patient Stress Factors: Health changes, Major life changes   GOALS   Self/Personal Goals: time with family; birthday celebration; Thanksgiving at aunt's in [place]   INTERVENTIONS   Spiritual Care Interventions Made: Compassionate presence, Reflective listening    INTERVENTION OUTCOMES   Outcomes: Connection to spiritual care, Connection to values and goals of care, Reduced isolation  [facility] CARE PLAN   Spiritual Care Issues Still Outstanding: Chaplain will continue to follow    [facility] Olam Filiberto Lemming, [facility] M-F daytime pager [phone] [facility] 24/7 pager [phone] Voicemail [phone]

## 2024-04-18 NOTE — TOC Progression Note (Addendum)
 Transition of Care [facility]) - Progression Note    Patient Details  Name: Chelsea Hansen MRN: [id number] Date of Birth: 1970/09/06  Transition of Care [facility] CM/SW Contact  Doneta Glenys DASEN, RN Phone Number: 04/18/2024, 10:34 AM  Clinical Narrative:    CM spoke with patient in the room. Discussed discharge plan for home hospice with [name]. CM chatted [name] to update. 11:03 AM Per [name] Met with patient, mother and father. She will need a bed and wheelchair delivered to her daughter's home here in [place]. We'll work on that today.  CM will keep [facility] updated on anticipated dc date.                    Expected Discharge Plan and Services                                               Social Drivers of Health (SDOH) Interventions SDOH Screenings   Food Insecurity: No Food Insecurity (04/11/2024)  Recent Concern: Food Insecurity - Food Insecurity Present (02/16/2024)  Housing: Low Risk  (04/02/2024)  Transportation Needs: No Transportation Needs (04/01/2024)  Utilities: Not At Risk (04/01/2024)  Depression (PHQ2-9): Medium Risk (03/30/2024)  Financial Resource Strain: High Risk (02/13/2024)  Physical Activity: Inactive (02/13/2024)  Social Connections: Moderately Isolated (04/01/2024)  Stress: No Stress Concern Present (02/13/2024)  Tobacco Use: Low Risk  (04/02/2024)  Recent Concern: Tobacco Use - High Risk (01/27/2024)   [facility] from [facility]  Health Literacy: Adequate Health Literacy (02/13/2024)    Readmission Risk Interventions    03/20/2024    3:17 PM 03/05/2024    4:03 PM 12/07/2023   12:21 PM  Readmission Risk Prevention Plan  Transportation Screening  Complete Complete  Medication Review (RN Care Manager) Complete Complete Complete  PCP or Specialist appointment within 3-5 days of discharge Complete Complete Complete  HRI or Home Care Consult Complete Complete Complete  SW Recovery Care/Counseling Consult  Complete Complete Complete  Palliative Care Screening Not Applicable Not Applicable Not Applicable  Skilled Nursing Facility Not Applicable Not Applicable Not Applicable

## 2024-04-18 NOTE — Progress Notes (Signed)
 PROGRESS NOTE    [name]  FMW:[id number] DOB: 1970-12-25 DOA: 04/01/2024 PCP: [name], NP   Brief Narrative:  Chelsea Hansen is a 53 year old lady with metastatic breast cancer with widespread abdominal metastases and peritoneum and small intestine with duodenal obstruction s/p with J-tube placement was admitted with septic shock due to Klebsiella bacteremia severe electrolyte derangements with SVT and hypotension requiring ICU care with IV pressors briefly.  Course has been complicated by ongoing leak around G-tube site with inability to take any p.o.'s or to use G-tube.  Patient has been seen by oncology who note that there are no further treatment options.   Patient is being closely followed by palliative care with discussion of goals for care.   At this time she is wanting to pursue home hospice, however still remains full code.    Prior team has had extensive discussion with patient about her disposition. She wants to pursue hospice at home in [place]. TOC has been consulted. Palliative care also involved. Starting to grasp the malignancy is the etiology of her PO intolerance and G-tube site issues, and there is nothing more to offer, therefore she will have to live with regurgitation and leakage. She still is holding onto hope for a miracle, since her cancer has reportedly gone into remission twice before. She understands there are no medical or surgical options, however still wants to try herbal remedies at home while on hospice. Will need ongoing code status discussion. She wants to remain full code for accidents not related to the cancer such as a car accident which I have advised is not possible.   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 Weaned off pressors. Transferred out of ICU.  Thought to be secondary to gut translocation versus soft tissue infection with previous subcutaneous fluid collection.  UA without evidence of UTI.  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  Patient is not a candidate for surgical management.  - care instructions include keep tubing parallel with the skin, minimize dressing layers, none under bumper, barrier cream to surrounding skin - can continue comfort feeds as tolerated  - Continues to have profound hypokalemia in the setting of bowel losses/loose stool, plan to discharge patient on p.o. potassium pending needs over the next 24 hours   Pain management Palliative care Goals for care - continue increased fentanyl  patch to 75 mcg/day - wean dilaudid  as tolerated  Palliative care has been managing her pain Discussions about goals for care ongoing.   Palliative care on board.  Dr. Clayton has long-term relationship with patient and family.  Patient remains full code but wishes to pursue home hospice per my discussions with her today.  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 Lovenox  resumed on  10/18   DVT prophylaxis: Place and maintain sequential compression device Start: 04/13/24 0120   Code Status:   Code Status: Full Code  Family Communication: At bedside  Status is: Inpatient  Dispo: The patient is from: Home              Anticipated d/c is to: Home with hospice              Anticipated d/c date is: 24 to 48 hours              Patient currently not medically stable for discharge  Consultants:  Palliative care/hospice, nephrology, PCCM  Procedures:  CT celiac plexus block and neurolysis 10/6  Antimicrobials:  None indicated  Subjective: No acute issues or events overnight denies nausea vomiting constipation headache fevers chills chest pain  Objective: Vitals:   04/17/24 1326 04/17/24 2021 04/18/24 0512 04/18/24 1319  BP: (!) 157/101 126/83 121/79 111/65  Pulse: 64 69 67 70  Resp: 20 16 16 16   Temp: 98.4 F (36.9 C) 98.4 F (36.9 C) 98.2 F (36.8 C) 98.4 F (36.9 C)  TempSrc: Oral Oral Oral Oral  SpO2: 100% 97% 96% 97%  Weight:   78.2 kg   Height:        Intake/Output Summary (Last 24 hours) at 04/18/2024 1525 Last data filed at 04/18/2024 1500 Gross per 24 hour  Intake 1200.07 ml  Output --  Net 1200.07 ml   Filed Weights   04/16/24 0500 04/17/24 0500 04/18/24 0512  Weight: 81 kg 81.2 kg 78.2 kg    Examination:  General:  Pleasantly resting in bed, No acute distress. HEENT:  Normocephalic atraumatic.  Sclerae nonicteric, noninjected.  Extraocular movements intact bilaterally. Neck:  Without mass or deformity.  Trachea is midline. Lungs:  Clear to auscultate bilaterally without rhonchi, wheeze, or rales. Heart:  Regular rate and rhythm.  Without murmurs, rubs, or gallops. Abdomen:  Soft, nontender, nondistended.  Without guarding or rebound. Extremities: Without cyanosis, clubbing, edema, or obvious deformity. Skin: Port noted to right upper chest, gastrostomy tube with mild leak noted.  Data Reviewed: I have personally reviewed  following labs and imaging studies  CBC: Recent Labs  Lab 04/12/24 0438 04/13/24 0400 04/15/24 0425 04/16/24 0433 04/17/24 2358  WBC 11.6* 9.1 7.1 8.7 10.3  NEUTROABS 9.4*  --  5.4  --   --   HGB 8.7* 8.9* 8.9* 9.0* 8.4*  HCT 26.1* 28.1* 28.0* 28.5* 25.8*  MCV 82.3 83.1 83.8 84.3 84.9  PLT 39* 43* 67* 82* 97*   Basic Metabolic Panel: Recent Labs  Lab 04/12/24 0438 04/13/24 0400 04/14/24 0458 04/15/24 0425 04/16/24 0433 04/17/24 2358 04/18/24 0850 04/18/24 1051  NA 134* 138 138 136 137 136  --  136  K 3.5 3.8 3.9 3.9 3.9 2.7*  --  3.2*  CL 103 104 102 101 101 99  --  99  CO2 22 24 26 26 27 31   --  24  GLUCOSE 160* 130* 135* 141* 143* 124*  --  133*  BUN 8 9 8 8 8 8   --  7  CREATININE 0.40* 0.38* 0.36* 0.38* 0.39* <0.30*  --  0.38*  CALCIUM  6.6* 6.6* 6.6* 6.4* 6.2* 6.2*  --  7.0*  MG 2.5*  2.4 2.4 1.9 1.9  --  1.7  --   PHOS 1.9* 2.2* 2.1* 2.3* 2.4*  --   --   --    GFR: Estimated Creatinine Clearance: 93 mL/min (A) (by C-G formula based on SCr of 0.38 mg/dL (L)).  Liver Function Tests: Recent Labs  Lab 04/14/24 0458 04/16/24 0433  AST 16 18  ALT 24 31  ALKPHOS 365* 388*  BILITOT 0.4 0.4  PROT 5.4* 5.4*  ALBUMIN  3.0* 3.1*   Recent Results (from the past 240 hours)  Culture, blood (Routine X 2) w Reflex to ID Panel     Status: Abnormal   Collection Time: 04/09/24  9:01 AM   Specimen: BLOOD RIGHT HAND  Result Value Ref Range Status   Specimen Description   Final    BLOOD RIGHT HAND Performed at [facility], [address], [place], [place] [address]    Special Requests   Final    BOTTLES DRAWN AEROBIC AND ANAEROBIC Blood Culture adequate volume Performed at [facility], [address], [place], [place] [address]    Culture  Setup Time   Final    GRAM NEGATIVE RODS IN BOTH AEROBIC AND ANAEROBIC BOTTLES CRITICAL RESULT CALLED TO, READ BACK BY AND VERIFIED WITH: PHARMD M LILLISTON 04/10/2024 @ 0115 BY AB Performed at [facility], [address] [place], [place] [address]    Culture KLEBSIELLA PNEUMONIAE (A)  Final   Report Status 04/11/2024 FINAL  Final   Organism ID, Bacteria KLEBSIELLA PNEUMONIAE  Final      Susceptibility   Klebsiella pneumoniae - MIC*    AMPICILLIN >=32 RESISTANT Resistant     CEFAZOLIN  (NON-URINE) 2 SENSITIVE Sensitive     CEFEPIME  <=0.12 SENSITIVE Sensitive     ERTAPENEM <=0.12 SENSITIVE Sensitive     CEFTRIAXONE  <=0.25 SENSITIVE Sensitive     CIPROFLOXACIN  <=0.06 SENSITIVE Sensitive     GENTAMICIN <=1 SENSITIVE Sensitive     MEROPENEM <=0.25 SENSITIVE Sensitive     TRIMETH /SULFA  <=20 SENSITIVE Sensitive     AMPICILLIN/SULBACTAM 4 SENSITIVE Sensitive     PIP/TAZO Value in next row Sensitive      <=4 SENSITIVEThis is a modified FDA-approved test that has been validated and its performance characteristics determined by the reporting laboratory.  This laboratory is certified under the Clinical Laboratory Improvement Amendments CLIA as qualified to perform high complexity clinical laboratory testing.    * KLEBSIELLA PNEUMONIAE  Culture, blood (Routine X 2) w Reflex to ID Panel     Status: Abnormal   Collection Time: 04/09/24  9:01 AM   Specimen: BLOOD LEFT HAND  Result Value Ref Range Status   Specimen Description   Final    BLOOD LEFT HAND Performed at [facility], [address], [place], [place] [address]    Special Requests   Final    BOTTLES DRAWN AEROBIC AND ANAEROBIC Blood Culture results may not be optimal due to an inadequate volume of blood received in culture bottles Performed at [facility], [address], [place], [place] [address]    Culture  Setup Time   Final    GRAM NEGATIVE RODS IN BOTH AEROBIC AND ANAEROBIC BOTTLES CRITICAL VALUE NOTED.  VALUE IS CONSISTENT WITH PREVIOUSLY REPORTED AND CALLED VALUE.    Culture (A)  Final    KLEBSIELLA PNEUMONIAE SUSCEPTIBILITIES PERFORMED ON PREVIOUS CULTURE WITHIN THE LAST 5  DAYS. Performed at [facility], [address] [place],  [place] [address]    Report Status 04/11/2024 FINAL  Final  Blood Culture ID Panel (Reflexed)     Status: Abnormal   Collection Time: 04/09/24  9:01 AM  Result Value Ref Range Status   Enterococcus faecalis NOT DETECTED NOT DETECTED Final   Enterococcus Faecium NOT DETECTED NOT DETECTED Final   Listeria monocytogenes NOT DETECTED NOT DETECTED Final   Staphylococcus species NOT DETECTED NOT DETECTED Final   Staphylococcus aureus (BCID) NOT DETECTED NOT DETECTED Final   Staphylococcus epidermidis NOT DETECTED NOT DETECTED Final   Staphylococcus lugdunensis NOT DETECTED NOT DETECTED Final   Streptococcus species NOT DETECTED NOT DETECTED Final   Streptococcus agalactiae NOT DETECTED NOT DETECTED Final   Streptococcus pneumoniae NOT DETECTED NOT DETECTED Final   Streptococcus pyogenes NOT DETECTED NOT DETECTED Final   A.calcoaceticus-baumannii NOT DETECTED NOT DETECTED Final   Bacteroides fragilis NOT DETECTED NOT DETECTED Final   Enterobacterales DETECTED (A) NOT DETECTED Final    Comment: Enterobacterales represent a large order of gram negative bacteria, not a single organism. CRITICAL RESULT CALLED TO, READ BACK BY AND VERIFIED WITH: PHARMD M LILLISTON 04/10/2024 @ 0115 BY AB    Enterobacter cloacae complex NOT DETECTED NOT DETECTED Final   Escherichia coli NOT DETECTED NOT DETECTED Final   Klebsiella aerogenes NOT DETECTED NOT DETECTED Final   Klebsiella oxytoca NOT DETECTED NOT DETECTED Final   Klebsiella pneumoniae DETECTED (A) NOT DETECTED Final    Comment: CRITICAL RESULT CALLED TO, READ BACK BY AND VERIFIED WITH: PHARMD M LILLISTON 04/10/2024 @ 0115 BY AB    Proteus species NOT DETECTED NOT DETECTED Final   Salmonella species NOT DETECTED NOT DETECTED Final   Serratia marcescens NOT DETECTED NOT DETECTED Final   Haemophilus influenzae NOT DETECTED NOT DETECTED Final   Neisseria meningitidis NOT DETECTED NOT  DETECTED Final   Pseudomonas aeruginosa NOT DETECTED NOT DETECTED Final   Stenotrophomonas maltophilia NOT DETECTED NOT DETECTED Final   Candida albicans NOT DETECTED NOT DETECTED Final   Candida auris NOT DETECTED NOT DETECTED Final   Candida glabrata NOT DETECTED NOT DETECTED Final   Candida krusei NOT DETECTED NOT DETECTED Final   Candida parapsilosis NOT DETECTED NOT DETECTED Final   Candida tropicalis NOT DETECTED NOT DETECTED Final   Cryptococcus neoformans/gattii NOT DETECTED NOT DETECTED Final   CTX-M ESBL NOT DETECTED NOT DETECTED Final   Carbapenem resistance IMP NOT DETECTED NOT DETECTED Final   Carbapenem resistance KPC NOT DETECTED NOT DETECTED Final   Carbapenem resistance NDM NOT DETECTED NOT DETECTED Final   Carbapenem resist OXA 48 LIKE NOT DETECTED NOT DETECTED Final   Carbapenem resistance VIM NOT DETECTED NOT DETECTED Final    Comment: Performed at [facility], [address], [place], [place] [address]  MRSA Next Gen by PCR, Nasal     Status: None   Collection Time: 04/13/24  3:51 AM   Specimen: Nasal Mucosa; Nasal Swab  Result Value Ref Range Status   MRSA by PCR Next Gen NOT DETECTED NOT DETECTED Final    Comment: (NOTE) The GeneXpert MRSA Assay (FDA approved for NASAL specimens only), is one component of a comprehensive MRSA colonization surveillance program. It is not intended to diagnose MRSA infection nor to guide or monitor treatment for MRSA infections. Test performance is not FDA approved in patients less than 51 years old. Performed at [facility], [address], [place], [place] [address]          Radiology Studies: No results found.  Scheduled Meds:  calcitRIOL   0.25 mcg Oral BID   calcium  carbonate (dosed in mg elemental calcium )  2,500 mg of elemental calcium  Oral TID BM   Chlorhexidine  Gluconate Cloth  6 each Topical Daily   ergocalciferol  (VITAMIN D2)  8,000 Units Oral Daily   fentaNYL   1 patch  Transdermal Q72H   Gerhardt's butt cream   Topical TID   hydrocortisone sod succinate (SOLU-CORTEF) inj  100 mg Intravenous Q8H   magnesium  oxide  400 mg Oral BID   multivitamin with minerals  1 tablet Oral Daily   OLANZapine  zydis  5 mg Oral QHS   pantoprazole  (PROTONIX ) IV  40 mg Intravenous Q24H   phosphorus  250 mg Oral TID   potassium chloride   40 mEq Oral Q4H   sodium chloride  flush  10-40 mL Intracatheter Q12H   sorbitol   30 mL Oral Q0600   Continuous Infusions:  calcium  gluconate Stopped (04/18/24 1114)    LOS: 16 days   Time spent:  Elsie JAYSON Montclair, DO [facility]  If 7PM-7AM, please contact night-coverage [email or web address]  04/18/2024, 3:25 PM

## 2024-04-18 NOTE — Plan of Care (Signed)
  Problem: Elimination: Goal: Will not experience complications related to urinary retention Outcome: Progressing   Problem: Safety: Goal: Ability to remain free from injury will improve Outcome: Progressing   Problem: Education: Goal: Knowledge of General Education information will improve Description: Including pain rating scale, medication(s)/side effects and non-pharmacologic comfort measures Outcome: Not Progressing   Problem: Health Behavior/Discharge Planning: Goal: Ability to manage health-related needs will improve Outcome: Not Progressing   Problem: Clinical Measurements: Goal: Ability to maintain clinical measurements within normal limits will improve Outcome: Not Progressing   Problem: Activity: Goal: Risk for activity intolerance will decrease Outcome: Not Progressing   Problem: Nutrition: Goal: Adequate nutrition will be maintained Outcome: Not Progressing   Problem: Coping: Goal: Level of anxiety will decrease Outcome: Not Progressing   Problem: Pain Managment: Goal: General experience of comfort will improve and/or be controlled Outcome: Not Progressing   Problem: Skin Integrity: Goal: Risk for impaired skin integrity will decrease Outcome: Not Progressing   Problem: Nutrition Goal: Nutritional status is improving Description: Monitor and assess patient for malnutrition (ex- brittle hair, bruises, dry skin, pale skin and conjunctiva, muscle wasting, smooth red tongue, and disorientation). Collaborate with interdisciplinary team and initiate plan and interventions as ordered.  Monitor patient's weight and dietary intake as ordered or per policy. Utilize nutrition screening tool and intervene per policy. Determine patient's food preferences and provide high-protein, high-caloric foods as appropriate.  Outcome: Not Progressing

## 2024-04-19 ENCOUNTER — Other Ambulatory Visit: Payer: Self-pay

## 2024-04-19 ENCOUNTER — Encounter: Payer: Self-pay | Admitting: Hematology and Oncology

## 2024-04-19 ENCOUNTER — Telehealth (HOSPITAL_COMMUNITY): Payer: Self-pay

## 2024-04-19 ENCOUNTER — Encounter (HOSPITAL_COMMUNITY): Payer: Self-pay | Admitting: Pharmacy Technician

## 2024-04-19 ENCOUNTER — Other Ambulatory Visit (HOSPITAL_COMMUNITY): Payer: Self-pay

## 2024-04-19 DIAGNOSIS — E878 Other disorders of electrolyte and fluid balance, not elsewhere classified: Secondary | ICD-10-CM | POA: Diagnosis not present

## 2024-04-19 LAB — BASIC METABOLIC PANEL WITH GFR
Anion gap: 9 (ref 5–15)
BUN: <5 mg/dL — ABNORMAL LOW (ref 6–20)
CO2: 29 mmol/L (ref 22–32)
Calcium: 6.6 mg/dL — ABNORMAL LOW (ref 8.9–10.3)
Chloride: 99 mmol/L (ref 98–111)
Creatinine, Ser: 0.31 mg/dL — ABNORMAL LOW (ref 0.44–1.00)
GFR, Estimated: >60 mL/min (ref >60)
Glucose, Bld: 119 mg/dL — ABNORMAL HIGH (ref 70–99)
Potassium: 3.3 mmol/L — ABNORMAL LOW (ref 3.5–5.1)
Sodium: 137 mmol/L (ref 135–145)

## 2024-04-19 MED ORDER — ERGOCALCIFEROL 200 MCG/ML PO SOLN
8000.0000 [IU] | Freq: Every day | ORAL | 0 refills | Status: DC
  Filled 2024-04-19: qty 30, 30d supply, fill #0

## 2024-04-19 MED ORDER — CALCIUM CARBONATE 1250 (500 CA) MG PO TABS
6250.0000 mg | ORAL_TABLET | Freq: Three times a day (TID) | ORAL | 0 refills | Status: DC
  Filled 2024-04-19: qty 450, 30d supply, fill #0

## 2024-04-19 MED ORDER — POTASSIUM CHLORIDE 20 MEQ PO PACK
40.0000 meq | PACK | Freq: Every day | ORAL | 0 refills | Status: DC
  Filled 2024-04-19: qty 4, 2d supply, fill #0
  Filled 2024-04-19: qty 56, 28d supply, fill #0

## 2024-04-19 MED ORDER — HYDROMORPHONE HCL 1 MG/ML PO LIQD
1.0000 mg | ORAL | 0 refills | Status: DC | PRN
  Filled 2024-04-19: qty 36, 3d supply, fill #0

## 2024-04-19 MED ORDER — OMEPRAZOLE 40 MG PO CPDR
40.0000 mg | DELAYED_RELEASE_CAPSULE | Freq: Every day | ORAL | 0 refills | Status: DC
  Filled 2024-04-19: qty 30, 30d supply, fill #0

## 2024-04-19 MED ORDER — K PHOS MONO-SOD PHOS DI & MONO 155-852-130 MG PO TABS
250.0000 mg | ORAL_TABLET | Freq: Three times a day (TID) | ORAL | 0 refills | Status: DC
  Filled 2024-04-19: qty 50, 17d supply, fill #0

## 2024-04-19 MED ORDER — SORBITOL 70 % SOLN
30.0000 mL | Freq: Every day | 0 refills | Status: DC
  Filled 2024-04-19: qty 473, 15d supply, fill #0

## 2024-04-19 MED ORDER — GERHARDT'S BUTT CREAM
1.0000 | TOPICAL_CREAM | Freq: Three times a day (TID) | CUTANEOUS | 0 refills | Status: DC
  Filled 2024-04-19: qty 60, 20d supply, fill #0

## 2024-04-19 MED ORDER — ENSURE PLUS HIGH PROTEIN PO LIQD
237.0000 mL | Freq: Two times a day (BID) | ORAL | 0 refills | Status: DC
  Filled 2024-04-19: qty 14220, 30d supply, fill #0

## 2024-04-19 MED ORDER — HEPARIN SOD (PORK) LOCK FLUSH 100 UNIT/ML IV SOLN
500.0000 [IU] | INTRAVENOUS | Status: AC | PRN
  Administered 2024-04-19: 500 [IU]
  Filled 2024-04-19: qty 5

## 2024-04-19 MED ORDER — FENTANYL 75 MCG/HR TD PT72
1.0000 | MEDICATED_PATCH | TRANSDERMAL | 0 refills | Status: DC
  Filled 2024-04-19: qty 5, 15d supply, fill #0

## 2024-04-19 MED ORDER — OXYCODONE HCL 5 MG/5ML PO SOLN
6.5000 mg | ORAL | 0 refills | Status: AC | PRN
  Filled 2024-04-19: qty 60, 3d supply, fill #0

## 2024-04-19 MED ORDER — OLANZAPINE 5 MG PO TBDP
5.0000 mg | ORAL_TABLET | Freq: Every day | ORAL | 0 refills | Status: DC
  Filled 2024-04-19: qty 30, 30d supply, fill #0

## 2024-04-19 MED ORDER — ADULT MULTIVITAMIN W/MINERALS CH
1.0000 | ORAL_TABLET | Freq: Every day | ORAL | 0 refills | Status: DC
  Filled 2024-04-19: qty 30, 30d supply, fill #0

## 2024-04-19 MED ORDER — HYDROCORTISONE 10 MG PO TABS
ORAL_TABLET | ORAL | 0 refills | Status: DC
  Filled 2024-04-19: qty 119, 28d supply, fill #0

## 2024-04-19 NOTE — Progress Notes (Signed)
 Patient refused to take PO magnesium , PO sodium bicarb, PO phosphorus, PO calcium  carbonate (liquid and pill form) this shift and other shifts per the [facility] documentation. RN was able to get the patient to take the potassium packets and the Calcitrol this shift.   So far per the [facility] documentation the patient has missed :3 doses of magnesium  oxide, 4 doses of phosphorus, 2 doses of vitamin D2, and 3 doses of Calcitrol   Patient said she tried to take the medications earlier in the day and threw up. RN offered antiemetics. Patient said she cannot take the oral medications. G-tube is no longer being used to administer medications d/t leaking and it is no longer in place per dayshift RN stating this is what the physician told her.   RN notified Lavanda Horns, NP via secure chat.

## 2024-04-19 NOTE — Telephone Encounter (Signed)
 Error

## 2024-04-19 NOTE — Discharge Summary (Signed)
 Physician Discharge Summary  Chelsea Hansen:[id number] DOB: 12-23-1970 DOA: 04/01/2024  PCP: [name], NP  Admit date: 04/01/2024 Discharge date: 04/19/2024  Admitted From: Home Disposition: Home with hospice  Recommendations for Outpatient Follow-up:  Follow-up with hospice as scheduled  Discharge Condition: Poor prognosis CODE STATUS: Full Diet recommendation: As tolerated  Brief/Interim Summary: Chelsea Hansen is a 53 year old lady with metastatic breast cancer with widespread abdominal metastases and peritoneum and small intestine with duodenal obstruction s/p with J-tube placement was admitted with septic shock due to Klebsiella bacteremia severe electrolyte derangements with SVT and hypotension requiring ICU care with IV pressors briefly. Course has been complicated by ongoing leak around G-tube site with inability to take any p.o.'s or to use G-tube.  Patient has been seen by oncology who note that there are no further treatment options. Patient is being closely followed by palliative care with discussion of goals for care. At this time she is wanting to pursue home hospice, however still remains full code.  Patient's septic shock due to Klebsiella bacteremia has resolved, she has completed antibiotics on 10/29.  Gven resolution of symptoms tolerating p.o. well now as well as medications she is otherwise stable and agreeable for discharge home with hospice.  Lengthy discussion in regards to patient's follow-up post discharge and she will follow at home with hospice there is no plan for further follow-up with PCP or specialists, no further imaging or labs indicated.  Patient and family indicate they may want to repeat labs or imaging in the future which we noted would need to be discussed with her hospice team.  She is at this time otherwise stable and agreeable for discharge home.   Prior team has had extensive discussion with patient about her disposition. She wants to  pursue hospice at home in [place]. TOC has been consulted. Palliative care also involved. Starting to grasp the malignancy is the etiology of her PO intolerance and G-tube site issues, and there is nothing more to offer, therefore she will have to live with regurgitation and leakage. She still is holding onto hope for a miracle, since her cancer has reportedly gone into remission twice before. She understands there are no medical or surgical options, however still wants to try herbal remedies at home while on hospice. Will need ongoing code status discussion. She wants to remain full code only for accidents not related to the cancer  which we discussed is not possible (explained there's no such thing as a conditional code status).  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 Weaned off pressors. Transferred out of ICU.  Thought to be secondary to gut translocation versus soft tissue infection with previous subcutaneous fluid collection.  UA without evidence of UTI.  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  Patient is not a surgical candidate.  - Lengthy instructions at  bedside given: Keep tubing parallel with the skin, minimize dressing layers, none under bumper, barrier cream to surrounding skin - can continue comfort feeds as tolerated  - Hypokalemia stabilizing, will discharge on supplemental potassium given losses appear to be downtrending appropriately   Pain management Palliative care Goals for care - Continue fentanyl  patch, liquid oxycodone   - palliative care has been managing her pain -will follow with hospice at home   Bilateral metastatic breast CA, ER/PR positive, HER2 negative Patient with no further treatment options per oncology   SVT Resolved Bleeding from enterocutaneous fistula -resolved  Discharge Instructions  Discharge Instructions     Call MD for:  difficulty breathing, headache or visual disturbances   Complete by: As directed    Call MD for:  extreme fatigue   Complete by: As directed    Call MD for:  hives   Complete by: As directed    Call MD for:  persistant dizziness or light-headedness   Complete by: As directed    Call MD for:  persistant nausea and vomiting   Complete by: As directed    Call MD for:  redness, tenderness, or signs of infection (pain, swelling, redness, odor or green/yellow discharge around incision site)   Complete by: As directed    Call MD for:  severe uncontrolled pain   Complete by: As directed    Call MD for:  temperature >100.4   Complete by: As directed    Diet general   Complete by: As directed    Soft - as tolerated   Increase activity slowly   Complete by: As directed    No wound care   Complete by: As directed       Allergies as of 04/19/2024       Reactions   Enhertu  [fam-trastuzumab  Deruxtec-nxki] Other (See Comments)   Headache and chest pain. See progress note from 02/16/2024. Patient able to complete infusion.   Xgeva  [denosumab ] Other (See Comments)   Severe prolonged hypocalcemia needing hospitalization        Medication List     STOP taking these medications     acetaminophen  325 MG tablet Commonly known as: TYLENOL    fentaNYL  25 MCG/HR Commonly known as: DURAGESIC  Replaced by: fentaNYL  75 MCG/HR   oxyCODONE  15 MG immediate release tablet Commonly known as: ROXICODONE        TAKE these medications    calcitRIOL  0.25 MCG capsule Commonly known as: ROCALTROL  Take 1 capsule (0.25 mcg total) by mouth 2 (two) times daily.   Calcium  Carb-Cholecalciferol  600-10 MG-MCG Tabs Commonly known as: Calcium  600+D Take 1 tablet by mouth 3 (three) times daily with meals.   calcium  carbonate 1250 (500 Ca) MG tablet Commonly known as: OS-CAL - dosed in mg of elemental calcium  Take 5 tablets (6,250 mg total) by mouth 3 (three) times daily with meals.   ergocalciferol  (VITAMIN D2) 200 MCG/ML drops Commonly known as: DRISDOL  Take 1 mL (8,000 Units total) by mouth daily.   feeding supplement Liqd Take 237 mLs by mouth 2 (two) times daily between meals.   fentaNYL  75 MCG/HR Commonly known as: DURAGESIC  Place 1 patch onto the skin every 3 (three) days. Start taking on: April 20, 2024 Replaces: fentaNYL  25 MCG/HR   Gerhardt's butt cream Crea Apply 1 Application topically 3 (three) times daily.   hydrocortisone 10 MG tablet Commonly known as: CORTEF  Take 5 tablets (50 mg total) by mouth 2 (two) times daily for 7 days, THEN 2 tablets (20 mg total) 2 (two) times daily for 7 days, THEN 1 tablet (10 mg total) 2 (two) times daily for 7 days, THEN 1 tablet (10 mg total) daily for 7 days. Start taking on: April 19, 2024   HYDROmorphone  HCl 1 MG/ML Liqd Commonly known as: DILAUDID  Take 1-2 mLs (1-2 mg total) by mouth every 4 (four) hours as needed for up to 3 days for severe pain (pain score 7-10) or moderate pain (pain score 4-6).   magnesium  oxide 400 (240 Mg) MG tablet Commonly known as: MAG-OX Take 1 tablet (400 mg total) by mouth 2 (two) times daily.   multivitamin with minerals Tabs tablet Take 1 tablet by mouth daily.   OLANZapine   zydis 5 MG disintegrating tablet Commonly known as: ZYPREXA  Take 1 tablet (5 mg total) by mouth at bedtime.   omeprazole  40 MG capsule Commonly known as: PRILOSEC Take 1 capsule (40 mg total) by mouth daily.   phosphorus 155-852-130 MG tablet Commonly known as: K PHOS  NEUTRAL Take 1 tablet (250 mg total) by mouth 3 (three) times daily.   Potassium Chloride  10 MEQ Pack Take 40 mEq by mouth daily.   sorbitol  70 % Soln Take 30 mLs by mouth daily at 6 (six) AM. Start taking on: April 20, 2024        Allergies  Allergen Reactions   Enhertu  [Fam-Trastuzumab  Deruxtec-Nxki] Other (See Comments)    Headache and chest pain. See progress note from 02/16/2024. Patient able to complete infusion.   Xgeva  [Denosumab ] Other (See Comments)    Severe prolonged hypocalcemia needing hospitalization    Consultations: Palliative care, interventional radiology  Procedures/Studies: DG Abd 1 View Result Date: 04/14/2024 EXAM: 1 VIEW XRAY OF THE ABDOMEN 04/14/2024 12:59:00 AM COMPARISON: None available. CLINICAL HISTORY: [id number] Gastrostomy tube dysfunction (HCC) [id number]. Gastrostomy tube dysfunction Gastrostomy tube dysfunction FINDINGS: LINES, TUBES AND DEVICES: PEG tube is seen in the left upper quadrant. Chest port is partially imaged. BOWEL: Nonobstructive bowel gas pattern. There is a moderate to large amount of stool throughout the colon. SOFT TISSUES: No opaque urinary calculi. BONES: No acute osseous abnormality. IMPRESSION: 1. No bowel obstruction. 2. Moderate to large amount of stool throughout the colon. Electronically signed by: Greig Pique MD 04/14/2024 01:06 AM EDT RP Workstation: [id number]   IR Replc Gastro/Colonic Tube Percut W/Fluoro Result Date: 04/12/2024 INDICATION: PEG tube replacement Leaking catheter. History of pancreatic mass with duodenal ureteral stricture/obstruction. Surgical gastrostomy and jejunostomy tubes placed 01/10/2024. EXAM: FLUOROSCOPIC GUIDED EXCHANGE OF  GASTROSTOMY TUBE COMPARISON:  CT CAP, 04/09/2024.  IR FLUOROSCOPY, 02/03/2024. MEDICATIONS: None. CONTRAST:  15mL OMNIPAQUE  IOHEXOL  300 MG/ML SOLN - administered into the gastric lumen FLUOROSCOPY: Radiation Exposure Index and estimated peak skin dose (PSD); Reference air kerma (RAK), 2 mGy. COMPLICATIONS: None immediate. PROCEDURE: Informed written consent was obtained from the patient and/or patient's representative after a discussion of the risks, benefits and alternatives to treatment. Questions regarding the procedure were encouraged and answered. A timeout was performed prior to the initiation of the procedure. The upper abdomen and external portion of the existing gastrostomy tube was prepped and draped in the usual sterile fashion, and a sterile drape was applied covering the operative field. Maximum barrier sterile technique with sterile gowns and gloves were used for the procedure. A timeout was performed prior to the initiation of the procedure. The existing gastrostomy tube was injected with a small amount of  contrast confirming appropriate positioning within the gastric lumen. The external portion of the gastrostomy tube was cut decompressing the retention balloon. Next, the gastrostomy tube was cannulated with a guidewire which was coiled within the gastric fundus. Over the stiff guide wire, the existing 28 Fr gastrostomy tube was exchanged for a new, slightly larger, 30 Fr balloon inflatable gastrostomy tube. The balloon was inflated with saline and dilute contrast and pulled against the anterior inner lumen of the stomach and the external disc was cinched. Contrast was injected and a post procedural spot fluoroscopic image was obtained confirming appropriate positioning and functionality of the new gastrostomy tube. A dressing was applied. The patient tolerated the procedure well without immediate postprocedural complication. IMPRESSION: Successful fluoroscopic-guided exchange and upsize for a new 30  Fr gastrostomy tube. The gastrostomy tube is ready for immediate use. RECOMMENDATIONS: The patient will return to [facility] ([facility]) for routine feeding tube evaluation and exchange in 6 months. Thom Hall, MD [facility] Electronically Signed   By: Thom Hall M.D.   On: 04/12/2024 13:22   CT CHEST ABDOMEN PELVIS W CONTRAST Result Date: 04/09/2024 EXAM: CT CHEST, ABDOMEN AND PELVIS WITH CONTRAST 04/09/2024 12:41:00 PM TECHNIQUE: CT of the chest, abdomen and pelvis was performed with the administration of 100 mL of iohexol  (OMNIPAQUE ) 300 MG/ML solution. Multiplanar reformatted images are provided for review. Automated exposure control, iterative reconstruction, and/or weight based adjustment of the mA/kV was utilized to reduce the radiation dose to as low as reasonably achievable. COMPARISON: CT abdomen and pelvis 04/01/2024. CLINICAL HISTORY: Abdominal abscess (Ped 0-17y); metastatic cancer, now with fever. Suspect abdominal wall abscess. Exam time extended due to patient transport by CT staff. FINDINGS: CHEST: MEDIASTINUM AND LYMPH NODES: Right chest wall access port-a-cath with tip terminating in the right atrium. Heart and pericardium are unremarkable. The central airways are clear. No mediastinal, hilar or axillary lymphadenopathy. LUNGS AND PLEURA: Limited evaluation of the lungs due to motion artifact. No central pulmonary embolus with limited evaluation more distally due to motion artifact and timing of contrast. No focal consolidation or pulmonary edema. No pleural effusion or pneumothorax. ABDOMEN AND PELVIS: LIMITATIONS: Limited evaluation of the abdomen and pelvis due to motion artifact. LIVER: The liver is enlarged, measuring up to 21 cm. GALLBLADDER AND BILE DUCTS: Gallbladder is unremarkable. No biliary ductal dilatation. SPLEEN: No acute abnormality. PANCREAS: No acute abnormality. ADRENAL GLANDS: No acute  abnormality. KIDNEYS, URETERS AND BLADDER: No stones in the kidneys or ureters. No hydronephrosis. No perinephric or periureteral stranding. Urinary bladder is unremarkable. No filling defects of the partially visualized collecting systems on delayed imaging. GI AND BOWEL: Question left lower quadrant ileostomy. Small hiatal hernia. Gastrostomy tube in appropriate position with tip and inflated balloon within the gastric lumen. Question interval development of pyloric and first portion of the duodenum trace bowel wall thickening and slight irregularity with markedly limited evaluation due to motion artifact. Colonic diverticulosis. No large bowel wall thickening or dilatation. The appendix is unremarkable. No small bowel dilatation. There is no bowel obstruction. REPRODUCTIVE ORGANS: Status post hysterectomy. No adnexal mass. 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 No ascites. No free air. VASCULATURE: Aorta is normal in caliber. ABDOMINAL AND PELVIS LYMPH NODES: No lymphadenopathy. BONES AND SOFT TISSUES: Right breast lumpectomy. Questions mild dermal thickening and edema of the left breast (2.58). Diffuse mild-appearing soft tissue edema the the lower abdominal wall . Resolution of surrounding inflammatory changes along the left lateral abdominal  wall musculature. Diffuse sclerotic changes metastatic lesions of the appendicular and axial skeleton. Chronic stable L3 fracture. No acute pathologic fracture identified. No focal soft tissue abnormality. IMPRESSION: 1. Questions mild dermal thickening and edema of the left breast. Recommend correlation with physical exam. 2. Question interval development of pyloric and first portion of the duodenum trace bowel wall thickening and slight irregularity with markedly limited evaluation due to motion artifact. This may represent artifact. 3. 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 4. Diffuse sclerotic changes metastatic  lesions of the appendicular and axial skeleton. 5. Colonic diverticulosis with no acute diverticulitis. Electronically signed by: Morgane Naveau MD 04/09/2024 09:16 PM EDT RP Workstation: [id number]   DG Abd 2 Views Result Date: 04/04/2024 EXAM: 2 VIEW XRAY OF THE ABDOMEN 04/04/2024 05:02:00 PM COMPARISON: Abdominal x-ray 09/325. CT abdomen and pelvis 06/14/2024. CLINICAL HISTORY: Vomiting. Patient reports long episodes of nausea and vomiting starting today. FINDINGS: LINES, TUBES AND DEVICES: PEG tube in place. BOWEL: Nonobstructive bowel gas pattern. SOFT TISSUES: Phleboliths in the pelvis. No opaque urinary calculi. BONES: Diffuse patchy sclerotic densities are seen throughout the osseous structures compatible with metastatic disease, unchanged. IMPRESSION: 1. No acute abdominal abnormality. 2. Diffuse patchy sclerotic osseous metastases, unchanged. Electronically signed by: Greig Pique MD 04/04/2024 05:21 PM EDT RP Workstation: [id number]   VAS US  LOWER EXTREMITY VENOUS (DVT) Result Date: 04/02/2024  Lower Venous DVT Study Patient Name:  SINCERITY CEDAR  Date of Exam:   04/02/2024 Medical Rec #: [id number]      Accession #:    [id number] Date of Birth: Jul 16, 1970     Patient Gender: F Patient Age:   39 years Exam Location:  [facility] Procedure:      VAS US  LOWER EXTREMITY VENOUS (DVT) Referring Phys: EDITHA RATHORE --------------------------------------------------------------------------------  Indications: Swelling.  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 Comparison Study: No prior study on file Performing Technologist: Alberta Lis RVS  Examination Guidelines: A complete evaluation includes B-mode imaging, spectral Doppler, color Doppler, and power Doppler as needed of all accessible portions of each vessel. Bilateral testing is considered an integral part of a complete  examination. Limited examinations for reoccurring indications may be performed as noted. The reflux portion of the exam is performed with the patient in reverse Trendelenburg.  +-----+---------------+---------+-----------+----------+--------------+ RIGHTCompressibilityPhasicitySpontaneityPropertiesThrombus Aging +-----+---------------+---------+-----------+----------+--------------+ CFV  Full           Yes      Yes                                 +-----+---------------+---------+-----------+----------+--------------+ SFJ  Full                                                        +-----+---------------+---------+-----------+----------+--------------+   +---------+---------------+---------+-----------+----------+-------------------+ LEFT     CompressibilityPhasicitySpontaneityPropertiesThrombus Aging      +---------+---------------+---------+-----------+----------+-------------------+ CFV      Full           Yes      Yes                                      +---------+---------------+---------+-----------+----------+-------------------+ SFJ  Full                                                             +---------+---------------+---------+-----------+----------+-------------------+ FV Prox  Full           Yes      Yes                                      +---------+---------------+---------+-----------+----------+-------------------+ FV Mid   Full                                                             +---------+---------------+---------+-----------+----------+-------------------+ FV DistalFull                                                             +---------+---------------+---------+-----------+----------+-------------------+ PFV      Full           Yes      Yes                                      +---------+---------------+---------+-----------+----------+-------------------+ POP                     Yes      Yes                   patent by color and                                                       Doppler             +---------+---------------+---------+-----------+----------+-------------------+ PTV      Full                                                             +---------+---------------+---------+-----------+----------+-------------------+ PERO     Full                                                             +---------+---------------+---------+-----------+----------+-------------------+     Summary: RIGHT: - No evidence of common femoral vein obstruction.   LEFT: - There is no evidence of deep vein thrombosis in the lower extremity.  - No cystic structure  found in the popliteal fossa.  *See table(s) above for measurements and observations. Electronically signed by Lonni Gaskins MD on 04/02/2024 at 3:47:47 PM.    Final    CT HEAD WO CONTRAST ( ) Result Date: 04/02/2024 CLINICAL DATA:  Recent fall with headaches, initial encounter EXAM: CT HEAD WITHOUT CONTRAST TECHNIQUE: Contiguous axial images were obtained from the base of the skull through the vertex without intravenous contrast. RADIATION DOSE REDUCTION: This exam was performed according to the departmental dose-optimization program which includes automated exposure control, adjustment of the mA and/or kV according to patient size and/or use of iterative reconstruction technique. COMPARISON:  None Available. FINDINGS: Brain: No evidence of acute infarction, hemorrhage, hydrocephalus, extra-axial collection or mass lesion/mass effect. Vascular: No hyperdense vessel or unexpected calcification. Skull: Normal. Negative for fracture or focal lesion. Sinuses/Orbits: No acute finding. Other: None. IMPRESSION: No acute intracranial abnormality noted. Electronically Signed   By: Oneil Devonshire M.D.   On: 04/02/2024 01:22   DG Knee Complete 4 Views Right Result Date: 04/01/2024 CLINICAL DATA:  Right knee pain following fall, initial  encounter EXAM: RIGHT KNEE - COMPLETE 4+ VIEW COMPARISON:  None Available. FINDINGS: No evidence of fracture, dislocation, or joint effusion. No evidence of arthropathy or other focal bone abnormality. Soft tissues are unremarkable. IMPRESSION: No acute abnormality noted. Electronically Signed   By: Oneil Devonshire M.D.   On: 04/01/2024 22:44   CT ABDOMEN PELVIS W CONTRAST Result Date: 04/01/2024 CLINICAL DATA:  Abdominal pain EXAM: CT ABDOMEN AND PELVIS WITH CONTRAST TECHNIQUE: Multidetector CT imaging of the abdomen and pelvis was performed using the standard protocol following bolus administration of intravenous contrast. RADIATION DOSE REDUCTION: This exam was performed according to the departmental dose-optimization program which includes automated exposure control, adjustment of the mA and/or kV according to patient size and/or use of iterative reconstruction technique. CONTRAST:  OMNIPAQUE  IOHEXOL  300 MG/ML  SOLN COMPARISON:  03/05/2024 FINDINGS: Lower chest: Small right pleural effusion. Hepatobiliary: No focal hepatic abnormality. Gallbladder unremarkable. Pancreas: No focal abnormality or ductal dilatation. Spleen: No focal abnormality.  Normal size. Adrenals/Urinary Tract: No suspicious renal or adrenal abnormality. No stones or hydronephrosis. Urinary bladder unremarkable. Stomach/Bowel: Colonic diverticulosis. No active diverticulitis. Gastrostomy tube within the stomach. Small bowel decompressed. No bowel obstruction or inflammatory process. Vascular/Lymphatic: No evidence of aneurysm or adenopathy. Reproductive: Prior hysterectomy.  No adnexal masses. Other: No free fluid or free air. Omental infiltration again noted anteriorly, stable since prior study compatible with peritoneal carcinomatosis. Left catheter tract again noted in the left abdominal wall with small gas and fluid collection in the region of the left oblique muscles, now measuring approximately 1.6 x 1 cm compared to 2.2 by 0.5 cm  previously. Musculoskeletal: Diffuse mixed lytic and sclerotic metastatic disease throughout the osseous structures. This is unchanged. IMPRESSION: Small right pleural effusion. Stable omental infiltration compatible with peritoneal carcinomatosis. Further decreased size of the small fluid collection in the left abdominal wall in the area of the left oblique muscles which appears to communicate with the catheter tract in the left abdominal wall. Colonic diverticulosis. Electronically Signed   By: Franky Crease M.D.   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 She had a good response to celiac plexus block on  01/16/2024 without complication. She now presents for repeat celiac block with additional neurolysis. EXAM: CT GUIDED NEUROLYTIC ABLATION OF THE CELIAC AXIS ANESTHESIA/SEDATION: Intravenous Fentanyl  50mcg and Versed  1.5mg  were administered by RN during a total moderate (conscious) sedation time of 29 minutes; the patient's level of consciousness and physiological / cardiorespiratory status were monitored continuously by radiology RN under my direct supervision. PROCEDURE: The procedure risks, benefits, and alternatives were explained to the patient. Questions regarding the procedure were encouraged and answered. The patient understands and consents to the procedure. The anterior abdominal wall was prepped with chlorhexidine  in a sterile fashion, and a sterile drape was applied covering the operative field. A sterile gown and sterile gloves were used for the procedure. Local anesthesia was provided with 1% Lidocaine . A 22 gauge Chiba needle was advanced under CT guidance to the level of the  celiac plexus. After confirming needle tip position, approximately three ml of a 1:10 dilution of Omnipaque -300 contrast and lidocaine  1% was injected. Spread of diluted contrast material was confirmed by CT, predominately on the right. Subsequently, a second parallel 22 gauge Chiba needle was advanced under CT guidance to the level of the celiac plexus to the left of midline. Dilute contrast:Lidocaine  8 mL was administered demonstrating good spread. Alcohol ablation of the celiac plexus was then performed with injection of 30 ml of absolute alcohol, divided between the 2 sites. CONTRAST:  2 mL Omnipaque  300 retroperitoneal COMPLICATIONS: None FINDINGS: The initial contrast injection showed excellent spread at the level of the celiac plexus, predominately on the right. After second needle placement, follow-up injection demonstrates excellent spread on the left. Alcohol ablation was successfully performed. No immediate complication. IMPRESSION: CT guided neurolytic ablation of the celiac plexus performed with 30 ml of absolute alcohol. Electronically Signed   By: JONETTA Faes M.D.   On: 03/26/2024 15:08   DG Abd 2 Views Result Date: 03/20/2024 CLINICAL DATA:  Nausea vomiting EXAM: ABDOMEN - 2 VIEW COMPARISON:  Ultrasound abdomen March 20, 2024 FINDINGS: Paucity of bowel gas. No bowel obstruction identified. Gastrostomy tube in place. There is no evidence of free air. No radio-opaque calculi or other significant radiographic abnormality is seen. Heterogeneous sclerotic changes throughout the axial and appendicular skeleton consistent with sclerotic metastasis (patient has history of breast cancer). IMPRESSION: No bowel obstruction. Sclerotic osseous metastasis. Electronically Signed   By: Megan  Zare M.D.   On: 03/20/2024 19:55     Subjective: No acute issues or events overnight denies nausea vomiting diarrhea constipation headache fevers chills or chest pain   Discharge Exam: Vitals:   04/19/24 0136  04/19/24 0445  BP: 130/80 121/72  Pulse: 74 72  Resp:  18  Temp:  98.4 F (36.9 C)  SpO2: 100% 97%   Vitals:   04/18/24 2028 04/19/24 0136 04/19/24 0445 04/19/24 0726  BP: 122/76 130/80 121/72   Pulse: 65 74 72   Resp: 18  18   Temp: 98.3 F (36.8 C)  98.4 F (36.9 C)   TempSrc:   Oral   SpO2: 100% 100% 97%   Weight:    77.5 kg  Height:        General:  Pleasantly resting in bed, No acute distress. HEENT:  Normocephalic atraumatic.  Sclerae nonicteric, noninjected.  Extraocular movements intact bilaterally. Neck:  Without mass or deformity.  Trachea is midline. Lungs:  Clear to auscultate bilaterally without rhonchi, wheeze, or rales. Heart:  Regular rate and rhythm.  Without murmurs, rubs, or gallops. Abdomen:  Soft, nontender, nondistended.  Without guarding or rebound. Extremities: Without cyanosis, clubbing, edema, or obvious deformity. Skin: Port noted to right upper chest, gastrostomy tube with scantily wet dressing/leak noted.    The results of significant diagnostics from this hospitalization (including imaging, microbiology, ancillary and laboratory) are listed below for reference.     Microbiology: Recent Results (from the past 240 hours)  MRSA Next Gen by PCR, Nasal     Status: None   Collection Time: 04/13/24  3:51 AM   Specimen: Nasal Mucosa; Nasal Swab  Result Value Ref Range Status   MRSA by PCR Next Gen NOT DETECTED NOT DETECTED Final    Comment: (NOTE) The GeneXpert MRSA Assay (FDA approved for NASAL specimens only), is one component of a comprehensive MRSA colonization surveillance program. It is not intended to diagnose MRSA infection nor to guide or monitor treatment for MRSA infections. Test performance is not FDA approved in patients less than 32 years old. Performed at [facility], [address], [place], [place] [address]      Labs: BNP (last 3 results) No results for input(s): BNP in the last 8760 hours. Basic  Metabolic Panel: Recent Labs  Lab 04/13/24 0400 04/14/24 0458 04/15/24 0425 04/16/24 0433 04/17/24 2358 04/18/24 0850 04/18/24 1051 04/19/24 0845  NA 138 138 136 137 136  --  136 137  K 3.8 3.9 3.9 3.9 2.7*  --  3.2* 3.3*  CL 104 102 101 101 99  --  99 99  CO2 24 26 26 27 31   --  24 29  GLUCOSE 130* 135* 141* 143* 124*  --  133* 119*  BUN 9 8 8 8 8   --  7 <5*  CREATININE 0.38* 0.36* 0.38* 0.39* <0.30*  --  0.38* 0.31*  CALCIUM  6.6* 6.6* 6.4* 6.2* 6.2*  --  7.0* 6.6*  MG 2.4 2.4 1.9 1.9  --  1.7  --   --   PHOS 2.2* 2.1* 2.3* 2.4*  --   --   --   --    Liver Function Tests: Recent Labs  Lab 04/14/24 0458 04/16/24 0433  AST 16 18  ALT 24 31  ALKPHOS 365* 388*  BILITOT 0.4 0.4  PROT 5.4* 5.4*  ALBUMIN  3.0* 3.1*   CBC: Recent Labs  Lab 04/13/24 0400 04/15/24 0425 04/16/24 0433 04/17/24 2358  WBC 9.1 7.1 8.7 10.3  NEUTROABS  --  5.4  --   --   HGB 8.9* 8.9* 9.0* 8.4*  HCT 28.1* 28.0* 28.5* 25.8*  MCV 83.1 83.8 84.3 84.9  PLT 43* 67* 82* 97*   Urinalysis    Component Value Date/Time   COLORURINE YELLOW 04/11/2024 0534   APPEARANCEUR CLEAR 04/11/2024 0534   LABSPEC 1.016 04/11/2024 0534   PHURINE 6.0 04/11/2024 0534   GLUCOSEU >=500 (A) 04/11/2024 0534   HGBUR NEGATIVE 04/11/2024 0534   BILIRUBINUR NEGATIVE 04/11/2024 0534   KETONESUR NEGATIVE 04/11/2024 0534   PROTEINUR 30 (A) 04/11/2024 0534   UROBILINOGEN 0.2 11/01/2013 1246   NITRITE NEGATIVE 04/11/2024 0534   LEUKOCYTESUR NEGATIVE 04/11/2024 0534   Sepsis Labs Recent Labs  Lab 04/13/24 0400 04/15/24 0425 04/16/24 0433 04/17/24 2358  WBC 9.1 7.1 8.7 10.3   Microbiology Recent Results (from the past 240 hours)  MRSA Next Gen by PCR, Nasal     Status: None   Collection Time: 04/13/24  3:51 AM   Specimen: Nasal Mucosa; Nasal Swab  Result Value Ref Range Status   MRSA by PCR Next Gen NOT DETECTED  NOT DETECTED Final    Comment: (NOTE) The GeneXpert MRSA Assay (FDA approved for NASAL specimens  only), is one component of a comprehensive MRSA colonization surveillance program. It is not intended to diagnose MRSA infection nor to guide or monitor treatment for MRSA infections. Test performance is not FDA approved in patients less than 45 years old. Performed at [facility], [address] [place], [place] [address]    Time coordinating discharge: Over 30 minutes  SIGNED:  Elsie JAYSON [place], DO [facility] 04/19/2024, 10:37 AM Pager   If 7PM-7AM, please contact night-coverage [email or web address]

## 2024-04-19 NOTE — Plan of Care (Signed)
  Problem: Education: Goal: Knowledge of General Education information will improve Description: Including pain rating scale, medication(s)/side effects and non-pharmacologic comfort measures Outcome: Progressing   Problem: Health Behavior/Discharge Planning: Goal: Ability to manage health-related needs will improve Outcome: Progressing   Problem: Clinical Measurements: Goal: Ability to maintain clinical measurements within normal limits will improve Outcome: Progressing Goal: Will remain free from infection Outcome: Progressing Goal: Diagnostic test results will improve Outcome: Progressing Goal: Respiratory complications will improve Outcome: Progressing Goal: Cardiovascular complication will be avoided Outcome: Progressing   Problem: Activity: Goal: Risk for activity intolerance will decrease Outcome: Progressing   Problem: Nutrition: Goal: Adequate nutrition will be maintained Outcome: Progressing   Problem: Coping: Goal: Level of anxiety will decrease Outcome: Progressing   Problem: Elimination: Goal: Will not experience complications related to bowel motility Outcome: Progressing Goal: Will not experience complications related to urinary retention Outcome: Progressing   Problem: Pain Managment: Goal: General experience of comfort will improve and/or be controlled Outcome: Progressing   Problem: Safety: Goal: Ability to remain free from injury will improve Outcome: Progressing   Problem: Skin Integrity: Goal: Risk for impaired skin integrity will decrease Outcome: Progressing   Problem: Nutrition Goal: Nutritional status is improving Description: Monitor and assess patient for malnutrition (ex- brittle hair, bruises, dry skin, pale skin and conjunctiva, muscle wasting, smooth red tongue, and disorientation). Collaborate with interdisciplinary team and initiate plan and interventions as ordered.  Monitor patient's weight and dietary intake as ordered or per  policy. Utilize nutrition screening tool and intervene per policy. Determine patient's food preferences and provide high-protein, high-caloric foods as appropriate.  Outcome: Progressing

## 2024-04-19 NOTE — Plan of Care (Signed)
  Problem: Clinical Measurements: Goal: Cardiovascular complication will be avoided Outcome: Progressing   Problem: Activity: Goal: Risk for activity intolerance will decrease Outcome: Progressing   Problem: Nutrition: Goal: Adequate nutrition will be maintained Outcome: Progressing   Problem: Coping: Goal: Level of anxiety will decrease Outcome: Progressing   Problem: Pain Managment: Goal: General experience of comfort will improve and/or be controlled Outcome: Progressing   Problem: Safety: Goal: Ability to remain free from injury will improve Outcome: Progressing   Problem: Skin Integrity: Goal: Risk for impaired skin integrity will decrease Outcome: Progressing

## 2024-04-19 NOTE — Telephone Encounter (Signed)
 Pharmacy Patient Advocate Encounter  Received notification from [facility] MEDICAID that Prior Authorization for Olanzapine  5mg  ODT has been APPROVED from 04/19/24 to 04/19/25   PA #/Case ID/Reference #: [id number]

## 2024-04-19 NOTE — Progress Notes (Signed)
 Discharge instructions given to patient questions asked and answered.

## 2024-04-19 NOTE — TOC Transition Note (Signed)
 Transition of Care [facility]) - Discharge Note   Patient Details  Name: Chelsea Hansen MRN: [id number] Date of Birth: 26-Jan-1971  [facility] CM/SW Contact:  Doneta Glenys DASEN, RN Phone Number: 04/19/2024, 3:18 PM   Clinical Narrative:    CM spoke with patient in the room. Patient states that her sister will be transporting her to her daughter house. The hospital bed and other equipment to be delivered to the daughter address. Patient is being discharge with [facility] consult if needs present   Final next level of care: Home w Hospice Care Barriers to Discharge: Barriers Resolved   Patient Goals and CMS Choice   CMS Medicare.gov Compare Post Acute Care list provided to:: Patient Choice offered to / list presented to : Patient, Parent [place] ownership interest in [facility].provided to:: Parent NA    Discharge Placement                       Discharge Plan and Services Additional resources added to the After Visit Summary for                  DME Arranged: N/A DME Agency: NA       HH Arranged: NA HH Agency: NA        Social Drivers of Health (SDOH) Interventions SDOH Screenings   Food Insecurity: No Food Insecurity (04/11/2024)  Recent Concern: Food Insecurity - Food Insecurity Present (02/16/2024)  Housing: Low Risk  (04/02/2024)  Transportation Needs: No Transportation Needs (04/01/2024)  Utilities: Not At Risk (04/01/2024)  Depression (PHQ2-9): Medium Risk (03/30/2024)  Financial Resource Strain: High Risk (02/13/2024)  Physical Activity: Inactive (02/13/2024)  Social Connections: Moderately Isolated (04/01/2024)  Stress: No Stress Concern Present (02/13/2024)  Tobacco Use: Low Risk  (04/02/2024)  Recent Concern: Tobacco Use - High Risk (01/27/2024)   Received from [facility]  Health Literacy: Adequate Health Literacy (02/13/2024)     Readmission Risk Interventions    03/20/2024    3:17 PM  03/05/2024    4:03 PM 12/07/2023   12:21 PM  Readmission Risk Prevention Plan  Transportation Screening  Complete Complete  Medication Review (RN Care Manager) Complete Complete Complete  PCP or Specialist appointment within 3-5 days of discharge Complete Complete Complete  HRI or Home Care Consult Complete Complete Complete  SW Recovery Care/Counseling Consult Complete Complete Complete  Palliative Care Screening Not Applicable Not Applicable Not Applicable  Skilled Nursing Facility Not Applicable Not Applicable Not Applicable

## 2024-04-20 ENCOUNTER — Encounter: Payer: Self-pay | Admitting: Hematology and Oncology

## 2024-04-20 ENCOUNTER — Other Ambulatory Visit (HOSPITAL_COMMUNITY): Payer: Self-pay

## 2024-04-20 ENCOUNTER — Telehealth: Payer: Self-pay | Admitting: *Deleted

## 2024-04-20 MED ORDER — OXYCODONE HCL 15 MG PO TABS
15.0000 mg | ORAL_TABLET | ORAL | 0 refills | Status: DC | PRN
  Filled 2024-04-20: qty 84, 14d supply, fill #0

## 2024-04-20 NOTE — Transitions of Care (Post Inpatient/ED Visit) (Signed)
   04/20/2024  Name: Chelsea Hansen MRN: [id number] DOB: [birth year]  Today's TOC FU Call Status: Today's TOC FU Call Status:: Successful TOC FU Call Completed TOC FU Call Complete Date: 04/20/24 Patient's Name and Date of Birth confirmed.  Transition Care Management Follow-up Telephone Call Date of Discharge: 04/19/24 Discharge Facility: [facility]) Type of Discharge: Inpatient Admission Primary Inpatient Discharge Diagnosis:: Electrolyte abnormality     RN made outreach to patient/family and confirmed that hospice services are in place.  Patient/family aware to contact hospice 24/7 for ay questions or concerns. No further interventions at this time.    [name] RN, BSN [place]  [facility] RN Care Manager [phone]

## 2024-04-24 ENCOUNTER — Telehealth (INDEPENDENT_AMBULATORY_CARE_PROVIDER_SITE_OTHER): Payer: Self-pay

## 2024-04-24 ENCOUNTER — Other Ambulatory Visit (HOSPITAL_COMMUNITY): Payer: Self-pay

## 2024-04-24 ENCOUNTER — Telehealth: Payer: Self-pay | Admitting: *Deleted

## 2024-04-24 MED ORDER — METHADONE HCL 5 MG PO TABS
5.0000 mg | ORAL_TABLET | Freq: Three times a day (TID) | ORAL | 0 refills | Status: DC
  Filled 2024-04-24: qty 45, 15d supply, fill #0

## 2024-04-24 NOTE — Telephone Encounter (Signed)
 Copied from CRM [id number]. Topic: Clinical - Medical Advice >> Apr 24, 2024 10:35 AM Harlene ORN wrote: Reason for CRM:  Mliss [facility] called. Patient has questions about her Hospice care. Wants to know why she has a Peg-2 and wants to have it removed. Please call back to discuss further. Phone: [phone]

## 2024-04-24 NOTE — Telephone Encounter (Signed)
 Received call from Mliss with [facility] [phone]) stating pt is confused as to where her cancer is and what future treatments she will be one.  Per MD last note and hospitalization, pt and family were educated on cancer diagnosis as well as no other tx options. Mliss also states pt G tube is leaking, looking back at hospital notes, it was noted to be leaking and IR unable to change out tube sizing and recommended frequent dressing changes. Mliss states she will educated pt as well as contact IR regarding tube.

## 2024-04-25 ENCOUNTER — Inpatient Hospital Stay: Payer: MEDICAID

## 2024-04-25 ENCOUNTER — Encounter: Payer: Self-pay | Admitting: Hematology and Oncology

## 2024-04-25 ENCOUNTER — Other Ambulatory Visit (HOSPITAL_COMMUNITY): Payer: Self-pay

## 2024-04-25 ENCOUNTER — Inpatient Hospital Stay: Payer: MEDICAID | Admitting: Hematology and Oncology

## 2024-04-25 ENCOUNTER — Inpatient Hospital Stay: Payer: MEDICAID | Admitting: Dietician

## 2024-04-26 ENCOUNTER — Other Ambulatory Visit (HOSPITAL_COMMUNITY): Payer: Self-pay

## 2024-04-26 ENCOUNTER — Other Ambulatory Visit: Payer: Self-pay | Admitting: *Deleted

## 2024-04-26 DIAGNOSIS — C50911 Malignant neoplasm of unspecified site of right female breast: Secondary | ICD-10-CM

## 2024-04-26 DIAGNOSIS — Z170 Estrogen receptor positive status [ER+]: Secondary | ICD-10-CM

## 2024-04-26 MED ORDER — ONDANSETRON 4 MG PO TBDP
4.0000 mg | ORAL_TABLET | ORAL | 0 refills | Status: DC | PRN
  Filled 2024-04-26: qty 30, 5d supply, fill #0

## 2024-04-26 NOTE — Progress Notes (Signed)
 Received call from pt requesting second opinion/ possible transfer of care referral be sent to [facility].  RN reviewed with MD who states if pt wishes to continue with treatment, [facility] will be the provider to take over care.  RN educated pt who verbalized understanding and successfully faxed referral to [phone].

## 2024-04-27 ENCOUNTER — Other Ambulatory Visit: Payer: MEDICAID | Admitting: Licensed Clinical Social Worker

## 2024-04-27 ENCOUNTER — Encounter: Payer: Self-pay | Admitting: Licensed Clinical Social Worker

## 2024-04-27 NOTE — Patient Instructions (Signed)
 Visit Information  Thank you for taking time to visit with me today.   Tailored Plan Medicaid On December 20, 2022 some people on [place] Medicaid will move to a new kind of Medicaid health plan called a Tailored Plan. Tailored Plans cover your doctor visits, prescription drugs, and health care services.    If your [place] Medicaid will move to a Tailored Plan, you should have gotten a letter and welcome packet. If you're not sure, call your [place] Medicaid Enrollment Broker at [phone] and ask.  Check out these free materials, in Spanish and English, to learn more about your Tailored Plan: Medicaid.[email or web address].Gov/Tailored-Plans/Toolkit  Tailored Care Management Services  TCM services are available to you now. If you are a Tailored Plan member or will be and want information about Tailored Care Management Services including rides to appointments and community and home services, call the Care Management provider for your county of residence:    [facility] [place])  Member Services: [phone] Behavioral Health Crisis Line: 443-404-0008, [place], [place], [place], [place])  Member Services: [phone] Behavioral Health Crisis Line: (514)511-6920     Please call the Suicide and Crisis Lifeline: 988 go to [facility] [address] [place] ([phone]) call 911 if you are experiencing a Mental Health or Behavioral Health Crisis or need someone to talk to.  Care plan and visit instructions communicated with the patient verbally today. Patient agrees to receive a copy in MyChart. Active MyChart status and patient understanding of how to access instructions and care plan via MyChart confirmed with patient.     Chelsea Hansen Chelsea Hansen Chelsea HEDWIG, PhD [facility], [facility] Social Worker Direct Dial: [phone]  Fax: [phone]

## 2024-04-27 NOTE — Patient Outreach (Signed)
 04/27/2024  Sw sent an email to [facility] and received this email back below:  Hello,  Contact was made with this member on 04/17/24. Member consented to services and support needs were documented.  Currently there is a wait list and we are in the process of assigning cases.  I anticipate having a CM assigned to this case within the next 1-2 weeks.   Regards,  Frankey Alert MA, [facility] TCM Supervisor [employer]. [email or web address] [phone]    SW called patient to give update, but the patient stated that no one called her from [facility], the Patient and SW stated that the future appointment on 05/18/24 at 11:00am with [facility] SW will still stand and then there should be an update with the [facility] CM contacting the patient .    Tobias CHARM Maranda HEDWIG, PhD [name], [facility] Social Worker Direct Dial: [phone]  Fax: [phone]

## 2024-04-27 NOTE — Patient Outreach (Signed)
 04/27/2024 Sw spoke with the patient and she still has not heard form her CM at [facility] resent another email to Frankey Alert to give consent from the patient t participate in the program.     Good afternoon,  Our records show unsuccessful TCM engagement for this member. Thanks for sharing the updated contact information. Someone from our outreach department will provide outreach. Member will be linked to a CM upon receiving verbal consent to participate in TCM with our agency.   Regards,  Frankey Alert MA, [facility] MHSUD TCM Supervisor [employer]. [email or web address] [phone]   Good afternoon,   We received your PCP/Provider Request for Care Manager Name or Assignment Referral Form. The member Chelsea Hansen Jul 14, 2070 is currently assigned to [facility].  The contact information for [facility] is: [email or web address]; [email or web address]; [email or web address]; [email or web address]    Thank you, Waldon Ellen       [facility] Population Health Operations Coordinator Pronouns: She, Her, Her   [email or web address]  P [phone] F [phone]

## 2024-04-28 NOTE — Telephone Encounter (Signed)
 I have worked with her in the past in the hospital, but not her primary GI (I'll put that person on here) as well. 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 G-tube placed in past due to issues of duodenal obstruction. J-tube placed for enteral nutrition at same time by surgery. 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 Eventually the G-tube and J-tube's were exchanged by interventional radiology due to issues of leakage or other issues (I cannot understand completely as I was not following her). I have not seen her more recently but seems like she has been able to tolerate oral intake based on last GI notes from recent hospitalization. The thought of continuing to have the G-tube would be that if she continued to have issues with tolerating oral intake, that she would be able to use that for venting purposes or for feeding if absolutely necessary. If she is tolerating full oral intake, not having nausea or vomiting, then hospice services can certainly consider whether PEG tube removal could be performed.  That would need to be done by interventional radiology who replaced this more recently. If she is not requiring J-tube feedings but she is having J-tube issues, again hospice services can certainly consider whether PEJ tube removal could be performed.  That will need to be done by interventional radiology replaced and more recently. 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 Further discussions can be had with interventional radiology if desired. GM

## 2024-04-29 ENCOUNTER — Other Ambulatory Visit: Payer: Self-pay

## 2024-04-29 ENCOUNTER — Emergency Department (HOSPITAL_COMMUNITY): Payer: MEDICAID

## 2024-04-29 ENCOUNTER — Inpatient Hospital Stay (HOSPITAL_COMMUNITY)
Admission: EM | Admit: 2024-04-29 | Discharge: 2024-05-04 | DRG: 394 | Disposition: A | Discharge status: Home / Self Care | Payer: MEDICAID | Attending: Internal Medicine | Admitting: Internal Medicine

## 2024-04-29 ENCOUNTER — Encounter (HOSPITAL_COMMUNITY): Payer: Self-pay

## 2024-04-29 DIAGNOSIS — F32A Depression, unspecified: Secondary | ICD-10-CM | POA: Diagnosis present

## 2024-04-29 DIAGNOSIS — Z5948 Other specified lack of adequate food: Secondary | ICD-10-CM

## 2024-04-29 DIAGNOSIS — F1120 Opioid dependence, uncomplicated: Secondary | ICD-10-CM | POA: Diagnosis present

## 2024-04-29 DIAGNOSIS — N1831 Chronic kidney disease, stage 3a: Secondary | ICD-10-CM | POA: Diagnosis present

## 2024-04-29 DIAGNOSIS — E440 Moderate protein-calorie malnutrition: Secondary | ICD-10-CM | POA: Diagnosis present

## 2024-04-29 DIAGNOSIS — Z933 Colostomy status: Secondary | ICD-10-CM

## 2024-04-29 DIAGNOSIS — L03311 Cellulitis of abdominal wall: Secondary | ICD-10-CM | POA: Diagnosis present

## 2024-04-29 DIAGNOSIS — C50411 Malignant neoplasm of upper-outer quadrant of right female breast: Secondary | ICD-10-CM | POA: Diagnosis present

## 2024-04-29 DIAGNOSIS — C7951 Secondary malignant neoplasm of bone: Secondary | ICD-10-CM | POA: Diagnosis present

## 2024-04-29 DIAGNOSIS — Z923 Personal history of irradiation: Secondary | ICD-10-CM

## 2024-04-29 DIAGNOSIS — Z931 Gastrostomy status: Secondary | ICD-10-CM | POA: Diagnosis not present

## 2024-04-29 DIAGNOSIS — C7889 Secondary malignant neoplasm of other digestive organs: Secondary | ICD-10-CM | POA: Diagnosis present

## 2024-04-29 DIAGNOSIS — C7989 Secondary malignant neoplasm of other specified sites: Secondary | ICD-10-CM | POA: Diagnosis present

## 2024-04-29 DIAGNOSIS — Z6820 Body mass index (BMI) 20.0-20.9, adult: Secondary | ICD-10-CM

## 2024-04-29 DIAGNOSIS — K632 Fistula of intestine: Secondary | ICD-10-CM | POA: Diagnosis present

## 2024-04-29 DIAGNOSIS — C50919 Malignant neoplasm of unspecified site of unspecified female breast: Secondary | ICD-10-CM | POA: Diagnosis not present

## 2024-04-29 DIAGNOSIS — J302 Other seasonal allergic rhinitis: Secondary | ICD-10-CM | POA: Diagnosis present

## 2024-04-29 DIAGNOSIS — D61818 Other pancytopenia: Secondary | ICD-10-CM | POA: Diagnosis present

## 2024-04-29 DIAGNOSIS — G893 Neoplasm related pain (acute) (chronic): Secondary | ICD-10-CM | POA: Diagnosis present

## 2024-04-29 DIAGNOSIS — D630 Anemia in neoplastic disease: Secondary | ICD-10-CM | POA: Diagnosis present

## 2024-04-29 DIAGNOSIS — Z170 Estrogen receptor positive status [ER+]: Secondary | ICD-10-CM

## 2024-04-29 DIAGNOSIS — K2970 Gastritis, unspecified, without bleeding: Secondary | ICD-10-CM | POA: Diagnosis present

## 2024-04-29 DIAGNOSIS — K9423 Gastrostomy malfunction: Secondary | ICD-10-CM | POA: Diagnosis not present

## 2024-04-29 DIAGNOSIS — R112 Nausea with vomiting, unspecified: Secondary | ICD-10-CM | POA: Diagnosis present

## 2024-04-29 DIAGNOSIS — Z8249 Family history of ischemic heart disease and other diseases of the circulatory system: Secondary | ICD-10-CM

## 2024-04-29 DIAGNOSIS — A419 Sepsis, unspecified organism: Secondary | ICD-10-CM | POA: Diagnosis not present

## 2024-04-29 DIAGNOSIS — Z888 Allergy status to other drugs, medicaments and biological substances status: Secondary | ICD-10-CM

## 2024-04-29 DIAGNOSIS — Z79891 Long term (current) use of opiate analgesic: Secondary | ICD-10-CM

## 2024-04-29 DIAGNOSIS — Z79899 Other long term (current) drug therapy: Secondary | ICD-10-CM

## 2024-04-29 DIAGNOSIS — N1330 Unspecified hydronephrosis: Secondary | ICD-10-CM | POA: Diagnosis present

## 2024-04-29 DIAGNOSIS — E785 Hyperlipidemia, unspecified: Secondary | ICD-10-CM | POA: Diagnosis present

## 2024-04-29 DIAGNOSIS — C786 Secondary malignant neoplasm of retroperitoneum and peritoneum: Secondary | ICD-10-CM | POA: Diagnosis present

## 2024-04-29 DIAGNOSIS — I129 Hypertensive chronic kidney disease with stage 1 through stage 4 chronic kidney disease, or unspecified chronic kidney disease: Secondary | ICD-10-CM | POA: Diagnosis present

## 2024-04-29 DIAGNOSIS — E876 Hypokalemia: Secondary | ICD-10-CM | POA: Diagnosis present

## 2024-04-29 DIAGNOSIS — F419 Anxiety disorder, unspecified: Secondary | ICD-10-CM | POA: Diagnosis present

## 2024-04-29 DIAGNOSIS — Z9884 Bariatric surgery status: Secondary | ICD-10-CM

## 2024-04-29 DIAGNOSIS — K2100 Gastro-esophageal reflux disease with esophagitis, without bleeding: Secondary | ICD-10-CM | POA: Diagnosis present

## 2024-04-29 DIAGNOSIS — Z5941 Food insecurity: Secondary | ICD-10-CM

## 2024-04-29 DIAGNOSIS — Z90710 Acquired absence of both cervix and uterus: Secondary | ICD-10-CM

## 2024-04-29 DIAGNOSIS — L8990 Pressure ulcer of unspecified site, unspecified stage: Secondary | ICD-10-CM | POA: Insufficient documentation

## 2024-04-29 DIAGNOSIS — K5730 Diverticulosis of large intestine without perforation or abscess without bleeding: Secondary | ICD-10-CM | POA: Diagnosis present

## 2024-04-29 DIAGNOSIS — Z86718 Personal history of other venous thrombosis and embolism: Secondary | ICD-10-CM

## 2024-04-29 DIAGNOSIS — Z59868 Other specified financial insecurity: Secondary | ICD-10-CM

## 2024-04-29 DIAGNOSIS — L0390 Cellulitis, unspecified: Secondary | ICD-10-CM

## 2024-04-29 DIAGNOSIS — Z803 Family history of malignant neoplasm of breast: Secondary | ICD-10-CM

## 2024-04-29 DIAGNOSIS — E559 Vitamin D deficiency, unspecified: Secondary | ICD-10-CM | POA: Diagnosis present

## 2024-04-29 DIAGNOSIS — D649 Anemia, unspecified: Secondary | ICD-10-CM | POA: Diagnosis present

## 2024-04-29 LAB — I-STAT CHEM 8, ED
BUN: 6 mg/dL (ref 6–20)
Calcium, Ion: 0.6 mmol/L — CL (ref 1.15–1.40)
Chloride: 98 mmol/L (ref 98–111)
Creatinine, Ser: 0.4 mg/dL — ABNORMAL LOW (ref 0.44–1.00)
Glucose, Bld: 91 mg/dL (ref 70–99)
HCT: 29 % — ABNORMAL LOW (ref 36.0–46.0)
Hemoglobin: 9.9 g/dL — ABNORMAL LOW (ref 12.0–15.0)
Potassium: 3.2 mmol/L — ABNORMAL LOW (ref 3.5–5.1)
Sodium: 136 mmol/L (ref 135–145)
TCO2: 22 mmol/L (ref 22–32)

## 2024-04-29 LAB — CBC WITH DIFFERENTIAL/PLATELET
Abs Immature Granulocytes: 0.05 K/uL (ref 0.00–0.07)
Basophils Absolute: 0 K/uL (ref 0.0–0.1)
Basophils Relative: 0 %
Eosinophils Absolute: 0 K/uL (ref 0.0–0.5)
Eosinophils Relative: 1 %
HCT: 28.8 % — ABNORMAL LOW (ref 36.0–46.0)
Hemoglobin: 9.1 g/dL — ABNORMAL LOW (ref 12.0–15.0)
Immature Granulocytes: 2 %
Lymphocytes Relative: 18 %
Lymphs Abs: 0.6 K/uL — ABNORMAL LOW (ref 0.7–4.0)
MCH: 27 pg (ref 26.0–34.0)
MCHC: 31.6 g/dL (ref 30.0–36.0)
MCV: 85.5 fL (ref 80.0–100.0)
Monocytes Absolute: 0.3 K/uL (ref 0.1–1.0)
Monocytes Relative: 11 %
Neutro Abs: 2.2 K/uL (ref 1.7–7.7)
Neutrophils Relative %: 68 %
Platelets: 169 K/uL (ref 150–400)
RBC: 3.37 MIL/uL — ABNORMAL LOW (ref 3.87–5.11)
RDW: 17.8 % — ABNORMAL HIGH (ref 11.5–15.5)
WBC: 3.2 K/uL — ABNORMAL LOW (ref 4.0–10.5)
nRBC: 0 % (ref 0.0–0.2)

## 2024-04-29 LAB — COMPREHENSIVE METABOLIC PANEL WITH GFR
ALT: 16 U/L (ref 0–44)
AST: 19 U/L (ref 15–41)
Albumin: 3.5 g/dL (ref 3.5–5.0)
Alkaline Phosphatase: 241 U/L — ABNORMAL HIGH (ref 38–126)
Anion gap: 13 (ref 5–15)
BUN: 7 mg/dL (ref 6–20)
CO2: 23 mmol/L (ref 22–32)
Calcium: 5 mg/dL — CL (ref 8.9–10.3)
Chloride: 99 mmol/L (ref 98–111)
Creatinine, Ser: 0.38 mg/dL — ABNORMAL LOW (ref 0.44–1.00)
GFR, Estimated: >60 mL/min (ref >60)
Glucose, Bld: 89 mg/dL (ref 70–99)
Potassium: 3.3 mmol/L — ABNORMAL LOW (ref 3.5–5.1)
Sodium: 135 mmol/L (ref 135–145)
Total Bilirubin: 0.7 mg/dL (ref 0.0–1.2)
Total Protein: 6.8 g/dL (ref 6.5–8.1)

## 2024-04-29 LAB — CREATININE, SERUM
Creatinine, Ser: 0.38 mg/dL — ABNORMAL LOW (ref 0.44–1.00)
GFR, Estimated: >60 mL/min (ref >60)

## 2024-04-29 LAB — I-STAT CG4 LACTIC ACID, ED: Lactic Acid, Venous: 0.4 mmol/L — ABNORMAL LOW (ref 0.5–1.9)

## 2024-04-29 LAB — LIPASE, BLOOD: Lipase: 16 U/L (ref 11–51)

## 2024-04-29 LAB — MAGNESIUM: Magnesium: 1.7 mg/dL (ref 1.7–2.4)

## 2024-04-29 MED ORDER — OXYCODONE HCL 5 MG PO TABS
15.0000 mg | ORAL_TABLET | ORAL | Status: DC | PRN
  Administered 2024-04-29: 15 mg via ORAL
  Filled 2024-04-29: qty 3

## 2024-04-29 MED ORDER — OXYCODONE HCL 5 MG PO TABS
15.0000 mg | ORAL_TABLET | Freq: Once | ORAL | Status: AC
  Administered 2024-04-29: 15 mg via ORAL
  Filled 2024-04-29: qty 3

## 2024-04-29 MED ORDER — KCL-LACTATED RINGERS-D5W 20 MEQ/L IV SOLN
INTRAVENOUS | Status: AC
  Filled 2024-04-29 (×3): qty 1000

## 2024-04-29 MED ORDER — SODIUM CHLORIDE 0.9 % IV BOLUS
1000.0000 mL | Freq: Once | INTRAVENOUS | Status: AC
  Administered 2024-04-29: 1000 mL via INTRAVENOUS

## 2024-04-29 MED ORDER — CALCIUM CARBONATE ANTACID 500 MG PO CHEW
200.0000 mg | CHEWABLE_TABLET | Freq: Three times a day (TID) | ORAL | Status: DC
  Administered 2024-04-29 – 2024-05-04 (×8): 200 mg via ORAL
  Filled 2024-04-29 (×9): qty 1

## 2024-04-29 MED ORDER — SODIUM CHLORIDE 0.9 % IV SOLN
2.0000 g | Freq: Three times a day (TID) | INTRAVENOUS | Status: DC
  Administered 2024-04-29 – 2024-04-30 (×2): 2 g via INTRAVENOUS
  Filled 2024-04-29 (×2): qty 12.5

## 2024-04-29 MED ORDER — CHLORHEXIDINE GLUCONATE CLOTH 2 % EX PADS
6.0000 | MEDICATED_PAD | Freq: Every day | CUTANEOUS | Status: DC
  Administered 2024-04-29 – 2024-05-04 (×6): 6 via TOPICAL

## 2024-04-29 MED ORDER — ACETAMINOPHEN 325 MG PO TABS
650.0000 mg | ORAL_TABLET | Freq: Once | ORAL | Status: AC
  Administered 2024-04-29: 650 mg via ORAL
  Filled 2024-04-29: qty 2

## 2024-04-29 MED ORDER — FENTANYL CITRATE (PF) 50 MCG/ML IJ SOSY
50.0000 ug | PREFILLED_SYRINGE | Freq: Once | INTRAMUSCULAR | Status: AC
  Administered 2024-04-29: 50 ug via INTRAVENOUS
  Filled 2024-04-29: qty 1

## 2024-04-29 MED ORDER — ONDANSETRON HCL 4 MG/2ML IJ SOLN
4.0000 mg | Freq: Once | INTRAMUSCULAR | Status: AC
  Administered 2024-04-29: 4 mg via INTRAVENOUS
  Filled 2024-04-29: qty 2

## 2024-04-29 MED ORDER — SODIUM CHLORIDE 0.9% FLUSH
10.0000 mL | INTRAVENOUS | Status: DC | PRN
  Administered 2024-04-30: 10 mL

## 2024-04-29 MED ORDER — CALCIUM GLUCONATE-NACL 1-0.675 GM/50ML-% IV SOLN
1.0000 g | Freq: Once | INTRAVENOUS | Status: AC
  Administered 2024-04-29: 1000 mg via INTRAVENOUS
  Filled 2024-04-29: qty 50

## 2024-04-29 MED ORDER — VANCOMYCIN HCL IN DEXTROSE 1-5 GM/200ML-% IV SOLN
1000.0000 mg | Freq: Once | INTRAVENOUS | Status: AC
  Administered 2024-04-29: 1000 mg via INTRAVENOUS
  Filled 2024-04-29: qty 200

## 2024-04-29 MED ORDER — VANCOMYCIN HCL IN DEXTROSE 1-5 GM/200ML-% IV SOLN
1000.0000 mg | Freq: Two times a day (BID) | INTRAVENOUS | Status: DC
  Administered 2024-04-30: 1000 mg via INTRAVENOUS
  Filled 2024-04-29: qty 200

## 2024-04-29 MED ORDER — MORPHINE SULFATE (PF) 4 MG/ML IV SOLN
4.0000 mg | Freq: Once | INTRAVENOUS | Status: AC
  Administered 2024-04-29: 4 mg via INTRAVENOUS
  Filled 2024-04-29: qty 1

## 2024-04-29 MED ORDER — FENTANYL CITRATE (PF) 50 MCG/ML IJ SOSY
50.0000 ug | PREFILLED_SYRINGE | INTRAMUSCULAR | Status: DC | PRN
  Administered 2024-04-29 – 2024-04-30 (×3): 50 ug via INTRAVENOUS
  Filled 2024-04-29 (×3): qty 1

## 2024-04-29 MED ORDER — IOHEXOL 300 MG/ML  SOLN
100.0000 mL | Freq: Once | INTRAMUSCULAR | Status: AC | PRN
  Administered 2024-04-29: 100 mL via INTRAVENOUS

## 2024-04-29 MED ORDER — ENOXAPARIN SODIUM 40 MG/0.4ML IJ SOSY
40.0000 mg | PREFILLED_SYRINGE | INTRAMUSCULAR | Status: DC
  Administered 2024-04-29 – 2024-05-03 (×5): 40 mg via SUBCUTANEOUS
  Filled 2024-04-29 (×5): qty 0.4

## 2024-04-29 MED ORDER — SODIUM CHLORIDE 0.9 % IV SOLN
2.0000 g | Freq: Once | INTRAVENOUS | Status: AC
  Administered 2024-04-29: 2 g via INTRAVENOUS
  Filled 2024-04-29: qty 12.5

## 2024-04-29 MED ORDER — PROCHLORPERAZINE EDISYLATE 10 MG/2ML IJ SOLN
5.0000 mg | Freq: Four times a day (QID) | INTRAMUSCULAR | Status: AC | PRN
  Administered 2024-04-29 – 2024-05-02 (×3): 5 mg via INTRAVENOUS
  Filled 2024-04-29 (×3): qty 2

## 2024-04-29 NOTE — ED Provider Notes (Signed)
 [place] EMERGENCY DEPARTMENT AT [facility] Provider Note   CSN: [id number] Arrival date & time: 04/29/24  1414     Patient presents with: Emesis   Chelsea Hansen is a 53 y.o. female history of metastatic breast cancer with widespread abdominal metastasis, G-tube, Klebsiella bacteremia, here presenting with abdominal pain and drainage around the G-tube.  Patient was just admitted to the hospital for Klebsiella bacteremia and finish course of Rocephin  on October 29. Patient states that since discharge she noticed some drainage around the G-tube site.  She also has been vomiting as well.  Patient states that she has some hand cramps and was concerned that her potassium may be low.  Patient states that she does not currently use tube feeds to the G-tube and does eat at baseline but has been vomiting.   The history is provided by the patient.       Prior to Admission medications   Medication Sig Start Date End Date Taking? Authorizing Provider  calcitRIOL  (ROCALTROL ) 0.25 MCG capsule Take 1 capsule (0.25 mcg total) by mouth 2 (two) times daily. 03/30/24   [name], NP  Calcium  Carb-Cholecalciferol  (CALCIUM  600+D) 600-10 MG-MCG TABS Take 1 tablet by mouth 3 (three) times daily with meals. 03/30/24   Causey, Morna Pickle, NP  calcium  carbonate (OS-CAL - DOSED IN MG OF ELEMENTAL CALCIUM ) 1250 (500 Ca) MG tablet Take 5 tablets (6,250 mg total) by mouth 3 (three) times daily with meals. 04/19/24   Lue Elsie BROCKS, MD  ergocalciferol , VITAMIN D2, (DRISDOL ) 200 MCG/ML drops Take 1 mL (8,000 Units total) by mouth daily. 04/19/24   Lue Elsie BROCKS, MD  feeding supplement (ENSURE PLUS HIGH PROTEIN) LIQD Take 237 mLs by mouth 2 (two) times daily between meals. 04/19/24   Lue Elsie BROCKS, MD  fentaNYL  (DURAGESIC ) 75 MCG/HR Place 1 patch onto the skin every 3 (three) days. 04/20/24   Lue Elsie BROCKS, MD  hydrocortisone (CORTEF) 10 MG tablet Take 5  tablets by mouth 2 times daily for 7 days, THEN take 2 tablets twice daily for 7 days, THEN take 1 tablet twice daily for 7 days, THEN take 1 tablet once daily for 7 days. 04/19/24 05/17/24  Lue Elsie BROCKS, MD  magnesium  oxide (MAG-OX) 400 (240 Mg) MG tablet Take 1 tablet (400 mg total) by mouth 2 (two) times daily. 03/28/24 06/26/24  [place], Eric J, DO  methadone (DOLOPHINE) 5 MG tablet Take 1 tablet by mouth three times a day 04/24/24     Multiple Vitamin (MULTIVITAMIN WITH MINERALS) TABS tablet Take 1 tablet by mouth daily. 04/19/24   Lue Elsie BROCKS, MD  Nystatin (GERHARDT'S BUTT CREAM) CREA Apply topically 3 (three) times daily. 04/19/24   Lue Elsie BROCKS, MD  OLANZapine  zydis (ZYPREXA ) 5 MG disintegrating tablet Take 1 tablet (5 mg total) by mouth at bedtime. 04/19/24   Lue Elsie BROCKS, MD  omeprazole  (PRILOSEC) 40 MG capsule Take 1 capsule (40 mg total) by mouth daily. 04/19/24   Lue Elsie BROCKS, MD  ondansetron  (ZOFRAN -ODT) 4 MG disintegrating tablet Dissolve 1 tablet by mouth every 4 hours as needed for nausea or vomiting 04/25/24     oxyCODONE  (ROXICODONE ) 15 MG immediate release tablet Take 1 tablet (15 mg total) by mouth every 4 (four) hours as needed for pain. 04/20/24     phosphorus (K PHOS  NEUTRAL) 155-852-130 MG tablet Take 1 tablet (250 mg total) by mouth 3 (three) times daily. 04/19/24   Lue Elsie BROCKS, MD  potassium  chloride (KLOR-CON ) 20 MEQ packet Take 40 mEq by mouth daily. 04/19/24   Lue Elsie BROCKS, MD  sorbitol  70 % SOLN Take 30 mLs by mouth daily at 6 (six) AM. 04/20/24   Lue Elsie BROCKS, MD    Allergies: Enhertu  [fam-trastuzumab  deruxtec-nxki] and Xgeva  [denosumab ]    Review of Systems  Constitutional:  Positive for chills.  Gastrointestinal:  Positive for vomiting.  All other systems reviewed and are negative.   Updated Vital Signs BP 106/82 (BP Location: Right Arm)   Pulse (!) 105   Temp 100.2 F (37.9 C) (Oral)   Resp 16    Ht 5' 11 (1.803 m)   Wt 72.6 kg   SpO2 98%   BMI 22.32 kg/m   Physical Exam Vitals and nursing note reviewed.  Constitutional:      Comments: Chronically ill-appearing  HENT:     Head: Normocephalic.     Nose: Nose normal.     Mouth/Throat:     Mouth: Mucous membranes are dry.  Eyes:     Extraocular Movements: Extraocular movements intact.     Pupils: Pupils are equal, round, and reactive to light.  Cardiovascular:     Rate and Rhythm: Normal rate and regular rhythm.     Pulses: Normal pulses.     Heart sounds: Normal heart sounds.  Pulmonary:     Effort: Pulmonary effort is normal.     Breath sounds: Normal breath sounds.  Abdominal:     Comments: Patient has a G-tube with some mild cellulitis around the tube.  Patient has some gastric content around the tube as well.  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  Musculoskeletal:        General: Normal range of motion.     Cervical back: Normal range of motion and neck supple.  Skin:    General: Skin is warm.     Capillary Refill: Capillary refill takes less than 2 seconds.  Neurological:     General: No focal deficit present.     Mental Status: She is oriented to person, place, and time.  Psychiatric:        Mood and Affect: Mood normal.        Behavior: Behavior normal.     (all labs ordered are listed, but only abnormal results are displayed) Labs Reviewed  I-STAT CG4 LACTIC ACID, ED - Abnormal; Notable for the following components:      Result Value   Lactic Acid, Venous 0.4 (*)    All other components within normal limits  I-STAT CHEM 8, ED - Abnormal; Notable for the following components:   Potassium 3.2 (*)    Creatinine, Ser 0.40 (*)    Calcium , Ion 0.60 (*)    Hemoglobin 9.9 (*)    HCT 29.0 (*)    All other components within normal limits  CULTURE, BLOOD (ROUTINE X 2)  CULTURE, BLOOD (ROUTINE X 2)  COMPREHENSIVE METABOLIC PANEL WITH GFR  CBC WITH  DIFFERENTIAL/PLATELET  URINALYSIS, W/ REFLEX TO CULTURE (INFECTION SUSPECTED)  LIPASE, BLOOD  MAGNESIUM   I-STAT CG4 LACTIC ACID, ED    EKG: None  Radiology: No results found.   Procedures   CRITICAL CARE Performed by: Alm VEAR Cave   Total critical care time: 39 minutes  Critical care time was exclusive of separately billable procedures and treating other patients.  Critical care was necessary to treat or prevent imminent or life-threatening deterioration.  Critical care was time spent personally  by me on the following activities: development of treatment plan with patient and/or surrogate as well as nursing, discussions with consultants, evaluation of patient's response to treatment, examination of patient, obtaining history from patient or surrogate, ordering and performing treatments and interventions, ordering and review of laboratory studies, ordering and review of radiographic studies, pulse oximetry and re-evaluation of patient's condition.   Medications Ordered in the ED  vancomycin  (VANCOCIN ) IVPB 1000 mg/200 mL premix (has no administration in time range)  ceFEPIme  (MAXIPIME ) 2 g in sodium chloride  0.9 % 100 mL IVPB (has no administration in time range)  sodium chloride  0.9 % bolus 1,000 mL (1,000 mLs Intravenous New Bag/Given 04/29/24 1539)  acetaminophen  (TYLENOL ) tablet 650 mg (650 mg Oral Given 04/29/24 1540)  fentaNYL  (SUBLIMAZE ) injection 50 mcg (50 mcg Intravenous Given 04/29/24 1539)                                    Medical Decision Making Chelsea Hansen is a 53 y.o. female here presenting with weakness and vomiting.  Patient has low-grade temperature and recently had Klebsiella bacteremia.  Concern for recurrent bacteremia from likely GI and infection.  Concern for possible subcutaneous infection around the tube versus infected G-tube.  Plan to do sepsis workup with CBC and CMP and lactate and cultures and CT abdomen pelvis and chest x-ray and urinalysis.  Will  give broad-spectrum antibiotics.  7:10 PM I reviewed patient's labs and calcium  is 5.  During the last admission it was around 6-7.  Ordered 2 g of calcium .  CT abdomen pelvis showed no acute changes.  Given recent bacteremia, I ordered broad-spectrum antibiotics.  Patient would like the G-tube to be removed and I told her that since IR replaced it last time, we will need a IR consult for removal.   Problems Addressed: Hypocalcemia: acute illness or injury Sepsis, due to unspecified organism, unspecified whether acute organ dysfunction present [facility]): acute illness or injury  Amount and/or Complexity of Data Reviewed Labs: ordered. Decision-making details documented in ED Course. Radiology: ordered and independent interpretation performed. Decision-making details documented in ED Course.  Risk OTC drugs. Prescription drug management.     Final diagnoses:  None    ED Discharge Orders     None          Patt Alm Macho, MD 04/29/24 1911

## 2024-04-29 NOTE — H&P (Incomplete)
 History and Physical    Chelsea Hansen FMW:[id number] DOB: March 15, 1971 DOA: 04/29/2024  Patient coming from: Home.  Chief Complaint: Nausea vomiting and discharge around the G-tube.  HPI: Chelsea Hansen is a 53 y.o. female with history of metastatic breast cancer with widespread abdominal metastasis and peritoneum and small intestine time with duodenal obstruction status post G-tube placement admitted recently for septic shock secondary to Klebsiella bacteremia and also had severe electrolyte derangements with SVT and hypotension requiring ICU care with pressors.  Course was complicated by ongoing leak around the G-tube site and inability to take p.o.'s or to use G-tube.  During  stay patient had  severe hypocalcemia attributed to possibly recent use of desunomab per the nephrology.  Patient presents to the ER with complaints of nausea vomiting ongoing for last 4 days with also increasing discharge around the G-tube.  Patient states since discharge home she has not used the G-tube and was able to tolerate oral feeds except for the last 4 days.  Denies any diarrhea.  Denies any productive cough chest pain.  No rash or skin lesions.  Patient has been having cramps of the extremities last few days.  ED Course: In the ER patient's blood pressure was in the low normal lactic acid was normal.  Blood pressure improved with fluid bolus.  Temperature was 100.2.  CT abdomen shows nothing acute except for mild gastric wall thickening around the insertion site of the G-tube.  Chest x-ray unremarkable.  Labs show potassium 3.3 creatinine 0.3 calcium  5 albumin  3.5 magnesium  1.7.  Patient was given IV calcium  replacement and potassium replacement and empiric antibiotics were started for possible sepsis and admitted for further management.  Review of Systems: As per HPI, rest all negative.   Past Medical History:  Diagnosis Date   Allergy    seasonal allergies   Anxiety    on meds   Asthma    uses inhaler    Breast cancer (HCC) 2012   RIGHT lumpectomy   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 Lynwood Nasuti   Hyperlipidemia    on meds   Hypertension    on meds   Peritoneal carcinomatosis (HCC)    S/P percutaneous endoscopic gastrostomy (PEG) tube placement (HCC)    SVT (supraventricular tachycardia)     Past Surgical History:  Procedure Laterality Date   ABDOMINAL HYSTERECTOMY     AXILLARY SENTINEL NODE BIOPSY Left 07/09/2021   Procedure: LEFT AXILLARY SENTINEL NODE BIOPSY;  Surgeon: [name], MD;  Location: [facility];  Service: General;  Laterality: Left;   BALLOON DILATION N/A 12/13/2023   Procedure: MERRILL HODGKIN;  Surgeon: Federico Rosario BROCKS, MD;  Location: [facility] ENDOSCOPY;  Service: Gastroenterology;  Laterality: N/A;   BIOPSY  06/29/2022   Procedure: BIOPSY;  Surgeon: Legrand Victory LITTIE DOUGLAS, MD;  Location: [facility] END[facility]COPY;  Service: Gastroenterology;;   BIOPSY OF SKIN SUBCUTANEOUS TISSUE AND/OR MUCOUS MEMBRANE  12/13/2023   Procedure: BIOPSY, SKIN, SUBCUTANEOUS TISSUE, OR MUCOUS MEMBRANE;  Surgeon: Federico Rosario BROCKS, MD;  Location: [facility] ENDOSCOPY;  Service: Gastroenterology;;   BREAST EXCISIONAL BIOPSY Right 09/16/2009   BREAST LUMPECTOMY WITH RADIOACTIVE SEED LOCALIZATION Bilateral 07/09/2021   Procedure: BILATERAL BREAST LUMPECTOMY WITH RADIOACTIVE SEED LOCALIZATION;  Surgeon: [name], MD;  Location: [facility];  Service: General;  Laterality: Bilateral;    BREAST SURGERY     lumpectomy   ESOPHAGOGASTRODUODENOSCOPY N/A 12/13/2023   Procedure: EGD (ESOPHAGOGASTRODUODENOSCOPY);  Surgeon: Federico Rosario BROCKS, MD;  Location: [facility]COPY;  Service: Gastroenterology;  Laterality: N/A;   ESOPHAGOGASTRODUODENOSCOPY N/A 12/26/2023   Procedure: EGD (ESOPHAGOGASTRODUODENOSCOPY);  Surgeon: Wilhelmenia Aloha Raddle., MD;  Location: [facility];  Service: Gastroenterology;  Laterality: N/A;   ESOPHAGOGASTRODUODENOSCOPY N/A 12/27/2023   Procedure: EGD (ESOPHAGOGASTRODUODENOSCOPY);  Surgeon: Wilhelmenia Aloha Raddle., MD;  Location: [facility];  Service: Gastroenterology;  Laterality: N/A;   ESOPHAGOGASTRODUODENOSCOPY N/A 03/08/2024   Procedure: EGD (ESOPHAGOGASTRODUODENOSCOPY);  Surgeon: Wilhelmenia Aloha Raddle., MD;  Location: [facility];  Service: Gastroenterology;  Laterality: N/A;   ESOPHAGOGASTRODUODENOSCOPY (EGD) WITH PROPOFOL  N/A 06/29/2022   Procedure: ESOPHAGOGASTRODUODENOSCOPY (EGD) WITH PROPOFOL ;  Surgeon: Legrand Victory LITTIE DOUGLAS, MD;  Location: [facility];  Service: Gastroenterology;  Laterality: N/A;   EUS N/A 12/26/2023   Procedure: ULTRASOUND, UPPER GI TRACT, ENDOSCOPIC;  Surgeon: Wilhelmenia Aloha Raddle., MD;  Location: [facility];  Service: Gastroenterology;  Laterality: N/A;   EUS N/A 12/27/2023   Procedure: ULTRASOUND, UPPER GI TRACT, ENDOSCOPIC;  Surgeon: Wilhelmenia Aloha Raddle., MD;  Location: [facility];  Service: Gastroenterology;  Laterality: N/A;   EUS N/A 03/08/2024   Procedure: ULTRASOUND, UPPER GI TRACT, ENDOSCOPIC;  Surgeon: Wilhelmenia Aloha Raddle., MD;  Location: [facility];  Service: Gastroenterology;  Laterality: N/A;   IR FLUORO PROCEDURE UNLISTED  01/20/2024   IR IMAGING GUIDED PORT INSERTION  02/06/2024   IR REPLC DUODEN/JEJUNO TUBE PERCUT W/FLUORO  02/03/2024   IR REPLC GASTRO/COLONIC TUBE PERCUT W/FLUORO  01/20/2024   IR REPLC GASTRO/COLONIC TUBE PERCUT W/FLUORO  04/12/2024   LAPAROSCOPIC INSERTION GASTROSTOMY TUBE N/A 01/10/2024   Procedure:  laparoscopic insertion of gastrostomy tube, laparoscopic insertion of jejunostomy tube, upper endoscopy;  Surgeon: Kinsinger, Herlene Righter, MD;  Location: [facility];  Service: General;  Laterality: N/A;  Laproscopic insertion   LAPAROSCOPIC ROUX-EN-Y GASTRIC BYPASS WITH HIATAL HERNIA REPAIR N/A 01/02/2024   Procedure: LAPAROSCOPIC CREATION GASTRJEJONOSTOMY;  Surgeon: Lyndel Deward PARAS, MD;  Location: [facility];  Service: General;  Laterality: N/A;  GASTROJEJUNOSTOMY   PORT-A-CATH REMOVAL Left 07/09/2021   Procedure: REMOVAL PORT-A-CATH;  Surgeon: [name], MD;  Location: [facility];  Service: General;  Laterality: Left;   PORTACATH PLACEMENT Left 01/26/2021   Procedure: INSERTION PORT-A-CATH;  Surgeon: [name], MD;  Location: [facility];  Service: General;  Laterality: Left;   RADIOACTIVE SEED GUIDED AXILLARY SENTINEL LYMPH NODE Right 07/09/2021   Procedure: RADIOACTIVE SEED GUIDED RIGHT AXILLARY SENTINEL LYMPH NODE DISSECTION;  Surgeon: [name], MD;  Location: [facility];  Service: General;  Laterality: Right;   TUBAL LIGATION       reports that she has never smoked. She has never used smokeless tobacco. She reports that she does not currently use alcohol after a past usage of about 7.0 standard drinks of alcohol per week. She reports that she does not use drugs.  Allergies  Allergen Reactions   Enhertu  [Fam-Trastuzumab  Deruxtec-Nxki] Other (See Comments)    Headache and chest pain. See progress note from 02/16/2024. Patient able  to complete infusion.   Xgeva  [Denosumab ] Other (See Comments)    Severe prolonged hypocalcemia needing hospitalization    Family History  Problem Relation Age of Onset   Breast cancer Mother        53s   Hypertension Father    Uterine cancer Maternal Aunt    Ovarian cancer Maternal Aunt    Breast cancer Maternal Aunt        67s   Breast cancer Cousin 39   Uterine cancer Cousin 3   Colon polyps Neg Hx     Colon cancer Neg Hx    Esophageal cancer Neg Hx    Stomach cancer Neg Hx     Prior to Admission medications   Medication Sig Start Date End Date Taking? Authorizing Provider  calcitRIOL  (ROCALTROL ) 0.25 MCG capsule Take 1 capsule (0.25 mcg total) by mouth 2 (two) times daily. 03/30/24   [name], NP  Calcium  Carb-Cholecalciferol  (CALCIUM  600+D) 600-10 MG-MCG TABS Take 1 tablet by mouth 3 (three) times daily with meals. 03/30/24   Causey, Lindsey Cornetto, NP  calcium  carbonate (OS-CAL - DOSED IN MG OF ELEMENTAL CALCIUM ) 1250 (500 Ca) MG tablet Take 5 tablets (6,250 mg total) by mouth 3 (three) times daily with meals. 04/19/24   Lue Elsie BROCKS, MD  ergocalciferol , VITAMIN D2, (DRISDOL ) 200 MCG/ML drops Take 1 mL (8,000 Units total) by mouth daily. 04/19/24   Lue Elsie BROCKS, MD  feeding supplement (ENSURE PLUS HIGH PROTEIN) LIQD Take 237 mLs by mouth 2 (two) times daily between meals. 04/19/24   Lue Elsie BROCKS, MD  fentaNYL  (DURAGESIC ) 75 MCG/HR Place 1 patch onto the skin every 3 (three) days. 04/20/24   Lue Elsie BROCKS, MD  hydrocortisone (CORTEF) 10 MG tablet Take 5 tablets by mouth 2 times daily for 7 days, THEN take 2 tablets twice daily for 7 days, THEN take 1 tablet twice daily for 7 days, THEN take 1 tablet once daily for 7 days. 04/19/24 05/17/24  Lue Elsie BROCKS, MD  magnesium  oxide (MAG-OX) 400 (240 Mg) MG tablet Take 1 tablet (400 mg total) by mouth 2 (two) times daily. 03/28/24 06/26/24  [place], Eric J, DO  methadone (DOLOPHINE) 5 MG tablet Take 1 tablet by mouth three times a day 04/24/24     Multiple Vitamin (MULTIVITAMIN WITH MINERALS) TABS tablet Take 1 tablet by mouth daily. 04/19/24   Lue Elsie BROCKS, MD  Nystatin (GERHARDT'S BUTT CREAM) CREA Apply topically 3 (three) times daily. 04/19/24   Lue Elsie BROCKS, MD  OLANZapine  zydis (ZYPREXA ) 5 MG disintegrating tablet Take 1 tablet (5 mg total) by mouth at bedtime. 04/19/24    Lue Elsie BROCKS, MD  omeprazole  (PRILOSEC) 40 MG capsule Take 1 capsule (40 mg total) by mouth daily. 04/19/24   Lue Elsie BROCKS, MD  ondansetron  (ZOFRAN -ODT) 4 MG disintegrating tablet Dissolve 1 tablet by mouth every 4 hours as needed for nausea or vomiting 04/25/24     oxyCODONE  (ROXICODONE ) 15 MG immediate release tablet Take 1 tablet (15 mg total) by mouth every 4 (four) hours as needed for pain. 04/20/24     phosphorus (K PHOS  NEUTRAL) 155-852-130 MG tablet Take 1 tablet (250 mg total) by mouth 3 (three) times daily. 04/19/24   Lue Elsie BROCKS, MD  potassium chloride  (KLOR-CON ) 20 MEQ packet Take 40 mEq by mouth daily. 04/19/24   Lue Elsie BROCKS, MD  sorbitol  70 % SOLN Take 30 mLs by mouth daily at 6 (six) AM. 04/20/24   Lue Elsie  C, MD    Physical Exam: Constitutional: Moderately built and nourished. Vitals:   04/29/24 1800 04/29/24 1830 04/29/24 1845 04/29/24 1943  BP: 115/77 113/83 119/85   Pulse: 78 74 93   Resp: 20 16 12    Temp:    99.3 F (37.4 C)  TempSrc:      SpO2: 100% 100% 98%   Weight:      Height:       Eyes: Anicteric no pallor. ENMT: No discharge from the ears eyes nose or mouth. Neck: No mass felt.  No neck rigidity. Respiratory: No rhonchi or crepitations. Cardiovascular: S1-S2 heard. Abdomen: Soft G-tube seen.  Had some discharge around which was cleaned. Musculoskeletal: No edema. Skin: No rash. Neurologic: Alert awake oriented time place and person.  Moves all extremities. Psychiatric: Appears normal.  Normal affect.   Labs on Admission: I have personally reviewed following labs and imaging studies  CBC: Recent Labs  Lab 04/29/24 1520 04/29/24 1532  WBC 3.2*  --   NEUTROABS 2.2  --   HGB 9.1* 9.9*  HCT 28.8* 29.0*  MCV 85.5  --   PLT 169  --    Basic Metabolic Panel: Recent Labs  Lab 04/29/24 1520 04/29/24 1532  NA 135 136  K 3.3* 3.2*  CL 99 98  CO2 23  --   GLUCOSE 89 91  BUN 7 6  CREATININE 0.38*  0.40*  CALCIUM  5.0*  --   MG 1.7  --    GFR: Estimated Creatinine Clearance: 93 mL/min (A) (by C-G formula based on SCr of 0.4 mg/dL (L)). Liver Function Tests: Recent Labs  Lab 04/29/24 1520  AST 19  ALT 16  ALKPHOS 241*  BILITOT 0.7  PROT 6.8  ALBUMIN  3.5   Recent Labs  Lab 04/29/24 1520  LIPASE 16   No results for input(s): AMMONIA in the last 168 hours. Coagulation Profile: No results for input(s): INR, PROTIME in the last 168 hours. Cardiac Enzymes: No results for input(s): CKTOTAL, CKMB, CKMBINDEX, TROPONINI in the last 168 hours. BNP (last 3 results) No results for input(s): PROBNP in the last 8760 hours. HbA1C: No results for input(s): HGBA1C in the last 72 hours. CBG: No results for input(s): GLUCAP in the last 168 hours. Lipid Profile: No results for input(s): CHOL, HDL, LDLCALC, TRIG, CHOLHDL, LDLDIRECT in the last 72 hours. Thyroid  Function Tests: No results for input(s): TSH, T4TOTAL, FREET4, T3FREE, THYROIDAB in the last 72 hours. Anemia Panel: No results for input(s): VITAMINB12, FOLATE, FERRITIN, TIBC, IRON, RETICCTPCT in the last 72 hours. Urine analysis:    Component Value Date/Time   COLORURINE YELLOW 04/11/2024 0534   APPEARANCEUR CLEAR 04/11/2024 0534   LABSPEC 1.016 04/11/2024 0534   PHURINE 6.0 04/11/2024 0534   GLUCOSEU >=500 (A) 04/11/2024 0534   HGBUR NEGATIVE 04/11/2024 0534   BILIRUBINUR NEGATIVE 04/11/2024 0534   KETONESUR NEGATIVE 04/11/2024 0534   PROTEINUR 30 (A) 04/11/2024 0534   UROBILINOGEN 0.2 11/01/2013 1246   NITRITE NEGATIVE 04/11/2024 0534   LEUKOCYTESUR NEGATIVE 04/11/2024 0534   Sepsis Labs: @LABRCNTIP (procalcitonin:4,lacticidven:4) )No results found for this or any previous visit (from the past 240 hours).   Radiological Exams on Admission: CT ABDOMEN PELVIS W CONTRAST Result Date: 04/29/2024 EXAM: CT ABDOMEN AND PELVIS WITH CONTRAST 04/29/2024 05:42:44 PM  TECHNIQUE: CT of the abdomen and pelvis was performed with the administration of 100 mL of iohexol  (OMNIPAQUE ) 300 MG/ML solution. Multiplanar reformatted images are provided for review. Automated exposure control, iterative reconstruction, and/or weight-based adjustment  of the mA/kV was utilized to reduce the radiation dose to as low as reasonably achievable. COMPARISON: Chest abdomen and pelvis 04/09/2024 CLINICAL HISTORY: Sepsis; ab pain, pururlent drainage from G tube. Patient has hx of omental mets. FINDINGS: LOWER CHEST: There is no patchy atelectasis/airspace disease in the lingula. LIVER: Small cysts are present measuring up to 12 mm. GALLBLADDER AND BILE DUCTS: Gallbladder is unremarkable. No biliary ductal dilatation. SPLEEN: No acute abnormality. PANCREAS: No acute abnormality. ADRENAL GLANDS: No acute abnormality. KIDNEYS, URETERS AND BLADDER: There is mild right-sided hydroureteronephrosis without obstructing calculus identified. No stones in the kidneys or ureters. No perinephric or periureteral stranding. Urinary bladder is unremarkable. GI AND BOWEL: Percutaneous gastrostomy tube is in the body of the stomach. There is no fluid collection or significant inflammation surrounding the PEG tube. There is some mild gastric wall thickening at the PEG tube insertion site. There is diffuse colonic diverticulosis. There are scattered colonic diverticula. There is a large amount of stool in the rectosigmoid colon. The appendix appears normal. There is no bowel obstruction. PERITONEUM AND RETROPERITONEUM: No ascites. No free air. No discrete peritoneal nodules are identified. VASCULATURE: Aorta is normal in caliber. LYMPH NODES: No lymphadenopathy. REPRODUCTIVE ORGANS: No acute abnormality. BONES AND SOFT TISSUES: Diffuse sclerotic osseous metastatic disease again noted. There is stable chronic compression deformity of L3. No acute osseous abnormality. No focal soft tissue abnormality. IMPRESSION: 1. Mild  right-sided hydroureteronephrosis without obstructing calculus identified. 2. Diffuse colonic diverticulosis without evidence of diverticulitis. Large amount of stool in the rectosigmoid colon. 3. Percutaneous gastrostomy tube in appropriate position without surrounding fluid collection; mild gastric wall thickening at the insertion site, likely reactive. 4. Diffuse sclerotic osseous metastatic disease again noted; stable chronic compression deformity of L3. Electronically signed by: Greig Pique MD 04/29/2024 06:10 PM EST RP Workstation: [id number]   DG Chest Port 1 View Result Date: 04/29/2024 EXAM: 1 VIEW(S) XRAY OF THE CHEST 04/29/2024 03:34:00 PM COMPARISON: Chest x-ray 02/21/2024. CLINICAL HISTORY: fever, vomiting FINDINGS: LINES, TUBES AND DEVICES: Right chest port catheter tip in the distal SVC, unchanged. LUNGS AND PLEURA: No focal pulmonary opacity. No pulmonary edema. No pleural effusion. No pneumothorax. HEART AND MEDIASTINUM: No acute abnormality of the cardiac and mediastinal silhouettes. BONES AND SOFT TISSUES: No acute osseous abnormality. IMPRESSION: 1. No acute process. 2. Right chest port catheter tip in the distal SVC, unchanged. Electronically signed by: Greig Pique MD 04/29/2024 03:52 PM EST RP Workstation: [id number]    EKG: Independently reviewed.  Normal sinus rhythm.  Assessment/Plan Principal Problem:   Cellulitis Active Problems:   Hypocalcemia   Malignant neoplasm of upper-outer quadrant of right breast in female, estrogen receptor positive (HCC)   Pancytopenia (HCC)   Intractable nausea and vomiting    Possible developing sepsis -     recent admission for Klebsiella bacteremia.  Source not sure but could be possibly infection around the G-tube.  UA is pending.  Follow blood cultures.  Continue empiric antibiotics IV fluids. Severe hypocalcemia likely related to recent use of desunomab and vitamin D  deficiency as noted by nephrologist during last admission last month.   Patient has not taken calcitriol  as outpatient.  Presently patient is n.p.o. due to intractable nausea vomiting.  Discussed with pharmacy and will keep patient on IV calcium  infusion given the significant symptomatic Hypocalcemia.  Will follow metabolic panel every 6 hourly.  Consult nephrology in the morning. Intractable nausea vomiting CT abdomen pelvis only shows mild gastritis around the G-tube insertion site.  After admission when  patient attempted to eat patient had another episode of nausea vomiting.  There is some leak around the G-tube.  Will keep patient n.p.o. for now consult [facility] in the morning.  Will also consult IR since there is some leak around the G-tube. Hypokalemia replace and recheck. Anemia likely related to malignancy. Metastatic breast cancer per oncology no further treatment options. Chronic pain takes oxycodone .  Presently on IV fentanyl . History of enterocutaneous fistula.  Since patient has possible sepsis with severe hypercalcemia will need close monitoring further workup and more than 2 midnight stay.   DVT prophylaxis: Lovenox . Code Status: Full code. Family Communication: His daughters. Disposition Plan: Progressive care. Consults called: None. Admission status: Inpatient.

## 2024-04-29 NOTE — ED Notes (Signed)
 Admitting MD at bedside discussing plan of care with patient and sibling of patient

## 2024-04-29 NOTE — ED Notes (Signed)
 [facility] contacted for transport

## 2024-04-29 NOTE — Progress Notes (Signed)
 Pharmacy Antibiotic Note  Chelsea Hansen is a 53 y.o. female admitted on 04/29/2024 with wound infection.  Pharmacy has been consulted for Vancomycin , Cefepime  dosing.  Plan: Cefepime  2g IV q8h Vancomycin  1000 mg IV q12h  (SCr rounded to 0.8, Est AUC 469) Measure Vanc levels as needed.  Goal AUC = 400 - 550 Follow up renal function, culture results, and clinical course.   Height: 5' 11 (180.3 cm) Weight: 72.6 kg (160 lb) IBW/kg (Calculated) : 70.8  Temp (24hrs), Avg:99.8 F (37.7 C), Min:99.3 F (37.4 C), Max:100.2 F (37.9 C)  Recent Labs  Lab 04/29/24 1520 04/29/24 1531 04/29/24 1532  WBC 3.2*  --   --   CREATININE 0.38*  --  0.40*  LATICACIDVEN  --  0.4*  --     Estimated Creatinine Clearance: 93 mL/min (A) (by C-G formula based on SCr of 0.4 mg/dL (L)).    Allergies  Allergen Reactions   Enhertu  [Fam-Trastuzumab  Deruxtec-Nxki] Other (See Comments)    Headache and chest pain. See progress note from 02/16/2024. Patient able to complete infusion.   Xgeva  [Denosumab ] Other (See Comments)    Severe prolonged hypocalcemia needing hospitalization    Antimicrobials this admission: 11/9 Cefepime  >> 11/9 Vancomycin  >>   Dose adjustments this admission:   Microbiology results: 11/9 BCx:  Thank you for allowing pharmacy to be a part of this patient's care.  Wanda Hasting PharmD, BCPS [facility] main pharmacy [phone] 04/29/2024 9:06 PM

## 2024-04-29 NOTE — ED Triage Notes (Signed)
 Hospice/Ca pt: here by POV from home for NV x4d. Also endorses hands and feet cramping. Feels dehydrated, and that K is low. Also endorses abd pain, needs gastric tube checked, and HR racing. Denies fever or diarrhea. Alert, NAD, calm, interactive, resps e/u, speaking in clear complete sentences, skin W&D, steady gait.

## 2024-04-29 NOTE — Progress Notes (Signed)
 [facility] Hospitalized Hospice Patient Note  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 Chelsea Hansen called our agency stating increased pain and vomiting- a nurse was dispatched to her home for a visit.  Chelsea Hansen called our agency back stating she was unable to wait any longer because of her pain.  Patient's daughter was [facility] her [facility] ED.  Chelsea Bunker, RN/Triage with [facility]- called report to the ED.   Hospital liaison team will follow through final disposition. Please do not hesitate to reach out for any hospice related questions or concerns.  Chelsea HILARIO Na, RN Nurse Liaison [phone]

## 2024-04-30 DIAGNOSIS — D61818 Other pancytopenia: Secondary | ICD-10-CM | POA: Diagnosis not present

## 2024-04-30 DIAGNOSIS — A419 Sepsis, unspecified organism: Secondary | ICD-10-CM

## 2024-04-30 DIAGNOSIS — R112 Nausea with vomiting, unspecified: Secondary | ICD-10-CM | POA: Diagnosis not present

## 2024-04-30 DIAGNOSIS — L8990 Pressure ulcer of unspecified site, unspecified stage: Secondary | ICD-10-CM | POA: Insufficient documentation

## 2024-04-30 LAB — PHOSPHORUS: Phosphorus: 1.6 mg/dL — ABNORMAL LOW (ref 2.5–4.6)

## 2024-04-30 LAB — CBC WITH DIFFERENTIAL/PLATELET
Abs Immature Granulocytes: 0.04 K/uL (ref 0.00–0.07)
Basophils Absolute: 0 K/uL (ref 0.0–0.1)
Basophils Relative: 0 %
Eosinophils Absolute: 0 K/uL (ref 0.0–0.5)
Eosinophils Relative: 1 %
HCT: 22.9 % — ABNORMAL LOW (ref 36.0–46.0)
Hemoglobin: 7.3 g/dL — ABNORMAL LOW (ref 12.0–15.0)
Immature Granulocytes: 1 %
Lymphocytes Relative: 18 %
Lymphs Abs: 0.6 K/uL — ABNORMAL LOW (ref 0.7–4.0)
MCH: 27.7 pg (ref 26.0–34.0)
MCHC: 31.9 g/dL (ref 30.0–36.0)
MCV: 86.7 fL (ref 80.0–100.0)
Monocytes Absolute: 0.3 K/uL (ref 0.1–1.0)
Monocytes Relative: 10 %
Neutro Abs: 2.2 K/uL (ref 1.7–7.7)
Neutrophils Relative %: 70 %
Platelets: 119 K/uL — ABNORMAL LOW (ref 150–400)
RBC: 2.64 MIL/uL — ABNORMAL LOW (ref 3.87–5.11)
RDW: 18.1 % — ABNORMAL HIGH (ref 11.5–15.5)
WBC: 3.1 K/uL — ABNORMAL LOW (ref 4.0–10.5)
nRBC: 0 % (ref 0.0–0.2)

## 2024-04-30 LAB — BASIC METABOLIC PANEL WITH GFR
Anion gap: 12 (ref 5–15)
Anion gap: 9 (ref 5–15)
Anion gap: 10 (ref 5–15)
BUN: <5 mg/dL — ABNORMAL LOW (ref 6–20)
BUN: <5 mg/dL — ABNORMAL LOW (ref 6–20)
BUN: <5 mg/dL — ABNORMAL LOW (ref 6–20)
CO2: 21 mmol/L — ABNORMAL LOW (ref 22–32)
CO2: 24 mmol/L (ref 22–32)
CO2: 23 mmol/L (ref 22–32)
Calcium: 5.2 mg/dL — CL (ref 8.9–10.3)
Calcium: 5.4 mg/dL — CL (ref 8.9–10.3)
Calcium: 6.7 mg/dL — ABNORMAL LOW (ref 8.9–10.3)
Chloride: 105 mmol/L (ref 98–111)
Chloride: 106 mmol/L (ref 98–111)
Chloride: 102 mmol/L (ref 98–111)
Creatinine, Ser: 0.38 mg/dL — ABNORMAL LOW (ref 0.44–1.00)
Creatinine, Ser: 0.37 mg/dL — ABNORMAL LOW (ref 0.44–1.00)
Creatinine, Ser: 0.37 mg/dL — ABNORMAL LOW (ref 0.44–1.00)
GFR, Estimated: >60 mL/min (ref >60)
GFR, Estimated: >60 mL/min (ref >60)
GFR, Estimated: >60 mL/min (ref >60)
Glucose, Bld: 108 mg/dL — ABNORMAL HIGH (ref 70–99)
Glucose, Bld: 104 mg/dL — ABNORMAL HIGH (ref 70–99)
Glucose, Bld: 111 mg/dL — ABNORMAL HIGH (ref 70–99)
Potassium: 3.1 mmol/L — ABNORMAL LOW (ref 3.5–5.1)
Potassium: 3.6 mmol/L (ref 3.5–5.1)
Potassium: 3.9 mmol/L (ref 3.5–5.1)
Sodium: 138 mmol/L (ref 135–145)
Sodium: 138 mmol/L (ref 135–145)
Sodium: 135 mmol/L (ref 135–145)

## 2024-04-30 LAB — CBC
HCT: 28 % — ABNORMAL LOW (ref 36.0–46.0)
Hemoglobin: 8.7 g/dL — ABNORMAL LOW (ref 12.0–15.0)
MCH: 26.9 pg (ref 26.0–34.0)
MCHC: 31.1 g/dL (ref 30.0–36.0)
MCV: 86.4 fL (ref 80.0–100.0)
Platelets: 136 K/uL — ABNORMAL LOW (ref 150–400)
RBC: 3.24 MIL/uL — ABNORMAL LOW (ref 3.87–5.11)
RDW: 18.2 % — ABNORMAL HIGH (ref 11.5–15.5)
WBC: 4.2 K/uL (ref 4.0–10.5)
nRBC: 0 % (ref 0.0–0.2)

## 2024-04-30 LAB — URINALYSIS, ROUTINE W REFLEX MICROSCOPIC
Bilirubin Urine: NEGATIVE
Glucose, UA: NEGATIVE mg/dL
Hgb urine dipstick: NEGATIVE
Ketones, ur: 5 mg/dL — AB
Leukocytes,Ua: NEGATIVE
Nitrite: NEGATIVE
Protein, ur: NEGATIVE mg/dL
Specific Gravity, Urine: 1.011 (ref 1.005–1.030)
pH: 7 (ref 5.0–8.0)

## 2024-04-30 LAB — MAGNESIUM
Magnesium: 1.5 mg/dL — ABNORMAL LOW (ref 1.7–2.4)
Magnesium: 1.4 mg/dL — ABNORMAL LOW (ref 1.7–2.4)

## 2024-04-30 LAB — VITAMIN D 25 HYDROXY (VIT D DEFICIENCY, FRACTURES): Vit D, 25-Hydroxy: 9.63 ng/mL — ABNORMAL LOW (ref 30–100)

## 2024-04-30 MED ORDER — CALCITRIOL 0.25 MCG PO CAPS
0.2500 ug | ORAL_CAPSULE | Freq: Two times a day (BID) | ORAL | Status: DC
  Administered 2024-04-30 – 2024-05-04 (×8): 0.25 ug via ORAL
  Filled 2024-04-30 (×9): qty 1

## 2024-04-30 MED ORDER — SCOPOLAMINE 1 MG/3DAYS TD PT72
1.0000 | MEDICATED_PATCH | TRANSDERMAL | Status: DC
  Administered 2024-04-30 – 2024-05-03 (×2): 1 mg via TRANSDERMAL
  Filled 2024-04-30 (×3): qty 1

## 2024-04-30 MED ORDER — MAGNESIUM SULFATE 4 GM/100ML IV SOLN
4.0000 g | Freq: Once | INTRAVENOUS | Status: AC
  Administered 2024-04-30: 4 g via INTRAVENOUS
  Filled 2024-04-30: qty 100

## 2024-04-30 MED ORDER — OLANZAPINE 5 MG PO TBDP
5.0000 mg | ORAL_TABLET | Freq: Every day | ORAL | Status: DC
  Administered 2024-04-30 – 2024-05-03 (×4): 5 mg via ORAL
  Filled 2024-04-30 (×5): qty 1

## 2024-04-30 MED ORDER — SODIUM CHLORIDE 0.9 % IV SOLN
0.5000 mg/kg/h | INTRAVENOUS | Status: DC
  Administered 2024-04-30: 1 mg/kg/h via INTRAVENOUS
  Administered 2024-04-30: 0.5 mg/kg/h via INTRAVENOUS
  Administered 2024-05-01: 1 mg/kg/h via INTRAVENOUS
  Administered 2024-05-02: 0.5 mg/kg/h via INTRAVENOUS
  Filled 2024-04-30 (×8): qty 100

## 2024-04-30 MED ORDER — HYDROMORPHONE HCL 1 MG/ML IJ SOLN
1.0000 mg | INTRAMUSCULAR | Status: DC | PRN
  Administered 2024-04-30 – 2024-05-02 (×13): 1 mg via INTRAVENOUS
  Filled 2024-04-30 (×13): qty 1

## 2024-04-30 MED ORDER — ALBUTEROL SULFATE HFA 108 (90 BASE) MCG/ACT IN AERS: 2.0000 | INHALATION_SPRAY | Freq: Four times a day (QID) | RESPIRATORY_TRACT | Status: DC | PRN

## 2024-04-30 MED ORDER — ALBUTEROL SULFATE (2.5 MG/3ML) 0.083% IN NEBU: 2.5000 mg | INHALATION_SOLUTION | Freq: Four times a day (QID) | RESPIRATORY_TRACT | Status: DC | PRN

## 2024-04-30 MED ORDER — DEXAMETHASONE 2 MG PO TABS
2.0000 mg | ORAL_TABLET | Freq: Three times a day (TID) | ORAL | Status: DC
  Administered 2024-05-03 – 2024-05-04 (×3): 2 mg via ORAL
  Filled 2024-04-30 (×3): qty 1

## 2024-04-30 MED ORDER — POTASSIUM CHLORIDE 10 MEQ/100ML IV SOLN
10.0000 meq | INTRAVENOUS | Status: AC
Start: 2024-04-30 — End: 2024-04-30
  Administered 2024-04-30 (×3): 10 meq via INTRAVENOUS
  Filled 2024-04-30 (×3): qty 100

## 2024-04-30 MED ORDER — FENTANYL CITRATE (PF) 50 MCG/ML IJ SOSY
50.0000 ug | PREFILLED_SYRINGE | INTRAMUSCULAR | Status: DC | PRN
Start: 2024-04-30 — End: 2024-04-30

## 2024-04-30 MED ORDER — LACTATED RINGERS IV BOLUS
500.0000 mL | Freq: Once | INTRAVENOUS | Status: AC
  Administered 2024-04-30: 500 mL via INTRAVENOUS

## 2024-04-30 MED ORDER — FENTANYL 75 MCG/HR TD PT72
1.0000 | MEDICATED_PATCH | TRANSDERMAL | Status: DC
Start: 2024-04-30 — End: 2024-05-03
  Administered 2024-04-30: 1 via TRANSDERMAL
  Filled 2024-04-30: qty 1

## 2024-04-30 MED ORDER — POTASSIUM PHOSPHATES 15 MMOLE/5ML IV SOLN
30.0000 mmol | Freq: Once | INTRAVENOUS | Status: AC
  Administered 2024-04-30: 30 mmol via INTRAVENOUS
  Filled 2024-04-30: qty 10

## 2024-04-30 MED ORDER — DEXAMETHASONE 2 MG PO TABS
2.0000 mg | ORAL_TABLET | Freq: Four times a day (QID) | ORAL | Status: AC
  Administered 2024-04-30 – 2024-05-03 (×12): 2 mg via ORAL
  Filled 2024-04-30 (×12): qty 1

## 2024-04-30 MED ORDER — LACTATED RINGERS IV BOLUS
500.0000 mL | Freq: Once | INTRAVENOUS | Status: AC
Start: 2024-04-30 — End: 2024-04-30
  Administered 2024-04-30: 500 mL via INTRAVENOUS

## 2024-04-30 NOTE — Consult Note (Addendum)
 Chief Complaint: Patient was seen in consultation today for leaking G-tube  Referring Physician(s): Dr. Jadine  Supervising Physician: Luverne Aran  Patient Status: [facility] - In-pt  History of Present Illness: Chelsea Hansen is a 53 y.o. female with metastatic breast cancer with several recent hospitalizations due to progressive vs. Additional disease.  She was recently admitted in July 2025 for abdominal pain, nausea, vomiting due to duodenal stricture and SBO.  Also readmitted in October with Klebsiella bacteremia, sepsis, and poorly controlled G-tube leakage. She is readmitted to [facility] for ongoing leakage, nausea, vomiting.  Her J-tube was removed several weeks ago.  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  She has had multiple G-tubes over the past several months, all problematic, and has been upsized to a 30Fr tube with continued leakage.  She now has considerable skin breakdown around the tube.  Despite her multiple intestinal obstructions, she continues to eat a Regular diet.  She states she does not use the tube and as far as she is concerned removing it would suit her well.  Unfortunately she has considerable skin breakdown which may make placing another ostomy bag difficult-- although potentially not impossible.  At every assessment conducted with patient by this writer recently her bumper is pulled back to the port of the G-tube with the entirety of the length of the tube free floating along the tract.  Despite multiple prompts she does not like to cinch the bumper due to pain. She continues to eat a Regular diet and sees little value in bowel rest, etc.. to control the leakage. She does not vent the tube even though she complains of nausea/vomiting. She states she has never vented the tube.  There is no barrier cream on exam today.  Only saturated gauze.   Past Medical History:  Diagnosis Date   Allergy    seasonal  allergies   Anxiety    on meds   Asthma    uses inhaler   Breast cancer (HCC) 2012   RIGHT lumpectomy   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 Lynwood Nasuti   Hyperlipidemia    on meds   Hypertension    on meds   Peritoneal carcinomatosis (HCC)    S/P percutaneous endoscopic gastrostomy (PEG) tube placement (HCC)    SVT (supraventricular tachycardia)     Past Surgical History:  Procedure Laterality Date   ABDOMINAL HYSTERECTOMY     AXILLARY SENTINEL NODE BIOPSY Left 07/09/2021   Procedure: LEFT AXILLARY SENTINEL NODE BIOPSY;  Surgeon: [name], MD;  Location: [facility];  Service: General;  Laterality: Left;   BALLOON DILATION N/A 12/13/2023   Procedure: MERRILL HODGKIN;  Surgeon: Federico Rosario BROCKS, MD;  Location: [facility] END[facility]COPY;  Service: Gastroenterology;  Laterality: N/A;   BIOPSY  06/29/2022   Procedure: BIOPSY;  Surgeon: Legrand Victory LITTIE DOUGLAS, MD;  Location: [facility];  Service: Gastroenterology;;   BIOPSY OF SKIN SUBCUTANEOUS TISSUE AND/OR MUCOUS MEMBRANE  12/13/2023   Procedure: BIOPSY, SKIN, SUBCUTANEOUS TISSUE, OR MUCOUS MEMBRANE;  Surgeon: Federico Rosario BROCKS, MD;  Location: [facility] ENDOSCOPY;  Service: Gastroenterology;;   BREAST EXCISIONAL BIOPSY Right 09/16/2009   BREAST LUMPECTOMY WITH RADIOACTIVE SEED LOCALIZATION Bilateral 07/09/2021   Procedure: BILATERAL BREAST LUMPECTOMY WITH RADIOACTIVE SEED LOCALIZATION;  Surgeon: [name], MD;  Location: [facility];  Service: General;  Laterality: Bilateral;   BREAST SURGERY     lumpectomy   ESOPHAGOGASTRODUODENOSCOPY N/A 12/13/2023   Procedure: EGD (ESOPHAGOGASTRODUODENOSCOPY);  Surgeon: Federico Rosario BROCKS, MD;  Location: [facility];  Service: Gastroenterology;  Laterality: N/A;   ESOPHAGOGASTRODUODENOSCOPY N/A 12/26/2023   Procedure: EGD (ESOPHAGOGASTRODUODENOSCOPY);  Surgeon: Wilhelmenia Aloha Raddle., MD;  Location: [facility];  Service: Gastroenterology;  Laterality: N/A;   ESOPHAGOGASTRODUODENOSCOPY N/A 12/27/2023   Procedure: EGD (ESOPHAGOGASTRODUODENOSCOPY);  Surgeon: Wilhelmenia Aloha Raddle., MD;  Location: [facility];  Service: Gastroenterology;  Laterality: N/A;   ESOPHAGOGASTRODUODENOSCOPY N/A 03/08/2024   Procedure: EGD (ESOPHAGOGASTRODUODENOSCOPY);  Surgeon: Wilhelmenia Aloha Raddle., MD;  Location: [facility];  Service: Gastroenterology;  Laterality: N/A;   ESOPHAGOGASTRODUODENOSCOPY (EGD) WITH PROPOFOL  N/A 06/29/2022   Procedure: ESOPHAGOGASTRODUODENOSCOPY (EGD) WITH PROPOFOL ;  Surgeon: Legrand Victory LITTIE DOUGLAS, MD;  Location: [facility];  Service: Gastroenterology;  Laterality: N/A;   EUS N/A 12/26/2023   Procedure: ULTRASOUND, UPPER GI TRACT, ENDOSCOPIC;  Surgeon: Wilhelmenia Aloha Raddle., MD;  Location: [facility];  Service: Gastroenterology;  Laterality: N/A;   EUS N/A 12/27/2023   Procedure: ULTRASOUND, UPPER GI TRACT, ENDOSCOPIC;  Surgeon: Wilhelmenia Aloha Raddle., MD;  Location: [facility];  Service: Gastroenterology;  Laterality: N/A;   EUS N/A 03/08/2024   Procedure: ULTRASOUND, UPPER GI TRACT, ENDOSCOPIC;  Surgeon: Wilhelmenia Aloha Raddle., MD;  Location: [facility];  Service: Gastroenterology;  Laterality: N/A;   IR FLUORO PROCEDURE UNLISTED  01/20/2024   IR IMAGING GUIDED PORT INSERTION  02/06/2024   IR REPLC DUODEN/JEJUNO TUBE PERCUT W/FLUORO  02/03/2024   IR REPLC GASTRO/COLONIC TUBE PERCUT W/FLUORO  01/20/2024   IR REPLC GASTRO/COLONIC TUBE PERCUT W/FLUORO  04/12/2024    LAPAROSCOPIC INSERTION GASTROSTOMY TUBE N/A 01/10/2024   Procedure: laparoscopic insertion of gastrostomy tube, laparoscopic insertion of jejunostomy tube, upper endoscopy;  Surgeon: Kinsinger, Herlene Righter, MD;  Location: [facility];  Service: General;  Laterality: N/A;  Laproscopic insertion   LAPAROSCOPIC ROUX-EN-Y GASTRIC BYPASS WITH HIATAL HERNIA REPAIR N/A 01/02/2024   Procedure: LAPAROSCOPIC CREATION GASTRJEJONOSTOMY;  Surgeon: Lyndel Deward PARAS, MD;  Location: [facility];  Service: General;  Laterality: N/A;  GASTROJEJUNOSTOMY   PORT-A-CATH REMOVAL Left 07/09/2021   Procedure: REMOVAL PORT-A-CATH;  Surgeon: [name], MD;  Location: [facility];  Service: General;  Laterality: Left;   PORTACATH PLACEMENT Left 01/26/2021   Procedure: INSERTION PORT-A-CATH;  Surgeon: [name], MD;  Location: [facility];  Service: General;  Laterality: Left;   RADIOACTIVE SEED GUIDED AXILLARY SENTINEL LYMPH NODE Right 07/09/2021   Procedure: RADIOACTIVE SEED GUIDED RIGHT AXILLARY SENTINEL LYMPH NODE DISSECTION;  Surgeon: [name], MD;  Location: [facility];  Service: General;  Laterality: Right;   TUBAL LIGATION      Allergies: Enhertu  [fam-trastuzumab  deruxtec-nxki] and Xgeva  [denosumab ]  Medications: Prior to Admission medications   Medication Sig Start Date End Date Taking? Authorizing Provider  oxyCODONE -acetaminophen  (PERCOCET) 7.5-325 MG tablet Take 1 tablet by mouth every 4 (four) hours as needed for severe pain (pain score 7-10) (Take 1-2 tablets by mouth every 6 (six) hours as  needed for severe pain (pain score 7-10)). Max daily dose 8 tablets per provider. 02/04/24  Yes [name], [name], NP  abemaciclib  (VERZENIO ) 50 MG tablet Take 50 mg by mouth 2 (two) times daily. Patient not taking: Reported on 12/22/2023    [provider]  anastrozole  (ARIMIDEX ) 1 MG tablet Take 1 tablet (1 mg total) by mouth daily. Patient not taking: Reported on  01/25/2024 04/18/23   [name], NP  calcitRIOL  (ROCALTROL ) 0.5 MCG capsule Take 1 capsule (0.5 mcg total) by mouth daily. Patient not taking: Reported on 01/25/2024 12/15/23   [name], [name], MD  Calcium  Carb-Cholecalciferol  600-10 MG-MCG TABS Take 1 tablet by mouth 3 (three) times daily. Patient not taking: Reported on 01/25/2024 12/14/23 01/13/24  [name], [name], MD  gabapentin  (NEURONTIN ) 300 MG capsule Take 1 capsule (300 mg total) by mouth 3 (three) times daily. Patient not taking: Reported on 02/02/2024 05/26/23   [name], [name], MD  pantoprazole  (PROTONIX ) 40 MG tablet Take 1 tablet (40 mg total) by mouth 2 (two) times daily. Patient not taking: Reported on 02/02/2024 01/25/24   [name], NP  polyethylene glycol powder (GLYCOLAX /MIRALAX ) 17 GM/SCOOP powder Mix 17 g in 4 oz of liquid and take by mouth 2 (two) times daily as needed. Patient not taking: Reported on 02/02/2024 11/30/23   [name], NP  prochlorperazine  (COMPAZINE ) 10 MG tablet Take 1 tablet (10 mg total) by mouth every 6 (six) hours as needed for nausea or vomiting. Patient not taking: Reported on 02/02/2024 01/25/24   [name], NP  propranolol  (INDERAL ) 10 MG tablet Take 1 tablet (10 mg total) by mouth 2 (two) times daily. Please call [phone] to schedule an appointment for future refills. Thank you. 3rd attempt. Patient not taking: Reported on 12/13/2023 06/01/23   [name], [name], DO  scopolamine  (TRANSDERM-SCOP) 1 MG/3DAYS Place 1 patch (1.5 mg total) onto the skin behind ear every 3 (three) days as needed for dizziness, nausea or vomiting. Patient not taking: Reported on 02/02/2024 01/31/24   [name], Athena N, NP  senna (SENOKOT) 8.6 MG TABS tablet Take 2 tablets (17.2 mg total) by mouth 2 (two) times daily. Patient not taking: Reported on 02/02/2024 01/31/24   [place] [name], NP     Family History  Problem Relation Age of Onset   Breast cancer Mother         81s   Hypertension Father    Uterine cancer Maternal Aunt    Ovarian cancer Maternal Aunt    Breast cancer Maternal Aunt        2s   Breast cancer Cousin 34   Uterine cancer Cousin 38   Colon polyps Neg Hx    Colon cancer Neg Hx    Esophageal cancer Neg Hx    Stomach cancer Neg Hx     Social History   Socioeconomic History   Marital status: Single    Spouse name: Not on file   Number of children: Not on file   Years of education: Not on file   Highest education level: Not on file  Occupational History   Not on file  Tobacco Use   Smoking status: Never   Smokeless tobacco: Never  Vaping Use   Vaping status: Never Used  Substance and Sexual Activity   Alcohol use: Not Currently    Alcohol/week: 7.0 standard drinks of alcohol    Types: 7 Standard drinks or equivalent per week    Comment: occassional   Drug  use: No   Sexual activity: Not Currently  Other Topics Concern   Not on file  Social History Narrative   Not on file   Social Drivers of Health   Financial Resource Strain: High Risk (02/13/2024)   Overall Financial Resource Strain (CARDIA)    Difficulty of Paying Living Expenses: Hard  Food Insecurity: No Food Insecurity (04/29/2024)   Hunger Vital Sign    Worried About Running Out of Food in the Last Year: Never true    Ran Out of Food in the Last Year: Never true  Recent Concern: Food Insecurity - Food Insecurity Present (02/16/2024)   Hunger Vital Sign    Worried About Running Out of Food in the Last Year: Sometimes true    Ran Out of Food in the Last Year: Sometimes true  Transportation Needs: No Transportation Needs (04/29/2024)   PRAPARE - Administrator, Civil Service (Medical): No    Lack of Transportation (Non-Medical): No  Physical Activity: Inactive (02/13/2024)   Exercise Vital Sign    Days of Exercise per Week: 0 days    Minutes of Exercise per Session: 0 min  Stress: No Stress Concern Present (02/13/2024)   [employer] of  Occupational Health - Occupational Stress Questionnaire    Feeling of Stress: Not at all  Social Connections: Moderately Isolated (04/01/2024)   Social Connection and Isolation Panel    Frequency of Communication with Friends and Family: More than three times a week    Frequency of Social Gatherings with Friends and Family: Once a week    Attends Religious Services: 1 to 4 times per year    Active Member of [employer] or Organizations: No    Attends Banker Meetings: Never    Marital Status: Never married     Review of Systems: A 12 point ROS discussed and pertinent positives are indicated in the HPI above.  All other systems are negative.  Review of Systems  Constitutional:  Negative for fatigue and fever.  Respiratory:  Negative for cough and shortness of breath.   Cardiovascular:  Negative for chest pain.  Gastrointestinal:  Positive for abdominal pain, nausea and vomiting.  Musculoskeletal:  Negative for back pain.  Psychiatric/Behavioral:  Negative for behavioral problems and confusion.     Vital Signs: BP 110/71 (BP Location: Right Arm)   Pulse 80   Temp 98.3 F (36.8 C) (Oral)   Resp 20   Ht 5' 11 (1.803 m)   Wt 150 lb 5.7 oz (68.2 kg)   SpO2 100%   BMI 20.97 kg/m   Physical Exam Vitals and nursing note reviewed.  Constitutional:      General: She is not in acute distress.    Appearance: Normal appearance. She is not ill-appearing.  Neck:     Comments: Port in place Cardiovascular:     Rate and Rhythm: Normal rate and regular rhythm.  Pulmonary:     Effort: Pulmonary effort is normal.     Breath sounds: Normal breath sounds.  Abdominal:     General: Abdomen is flat. There is no distension.     Tenderness: There is abdominal tenderness.     Comments: G-tube in place. Bumper pulled back with entire length of tube free floating.  Significant drainage around the tube.  Gauze saturated.  Granuloma to the left position of the tube.   Skin:    General:  Skin is warm and dry.  Neurological:     General: No focal deficit  present.     Mental Status: She is alert and oriented to person, place, and time. Mental status is at baseline.  Psychiatric:        Mood and Affect: Mood normal.        Behavior: Behavior normal.        Thought Content: Thought content normal.        Judgment: Judgment normal.      Imaging: CT ABDOMEN PELVIS W CONTRAST Result Date: 04/29/2024 EXAM: CT ABDOMEN AND PELVIS WITH CONTRAST 04/29/2024 05:42:44 PM TECHNIQUE: CT of the abdomen and pelvis was performed with the administration of 100 mL of iohexol  (OMNIPAQUE ) 300 MG/ML solution. Multiplanar reformatted images are provided for review. Automated exposure control, iterative reconstruction, and/or weight-based adjustment of the mA/kV was utilized to reduce the radiation dose to as low as reasonably achievable. COMPARISON: Chest abdomen and pelvis 04/09/2024 CLINICAL HISTORY: Sepsis; ab pain, pururlent drainage from G tube. Patient has hx of omental mets. FINDINGS: LOWER CHEST: There is no patchy atelectasis/airspace disease in the lingula. LIVER: Small cysts are present measuring up to 12 mm. GALLBLADDER AND BILE DUCTS: Gallbladder is unremarkable. No biliary ductal dilatation. SPLEEN: No acute abnormality. PANCREAS: No acute abnormality. ADRENAL GLANDS: No acute abnormality. KIDNEYS, URETERS AND BLADDER: There is mild right-sided hydroureteronephrosis without obstructing calculus identified. No stones in the kidneys or ureters. No perinephric or periureteral stranding. Urinary bladder is unremarkable. GI AND BOWEL: Percutaneous gastrostomy tube is in the body of the stomach. There is no fluid collection or significant inflammation surrounding the PEG tube. There is some mild gastric wall thickening at the PEG tube insertion site. There is diffuse colonic diverticulosis. There are scattered colonic diverticula. There is a large amount of stool in the rectosigmoid colon. The appendix  appears normal. There is no bowel obstruction. PERITONEUM AND RETROPERITONEUM: No ascites. No free air. No discrete peritoneal nodules are identified. VASCULATURE: Aorta is normal in caliber. LYMPH NODES: No lymphadenopathy. REPRODUCTIVE ORGANS: No acute abnormality. BONES AND SOFT TISSUES: Diffuse sclerotic osseous metastatic disease again noted. There is stable chronic compression deformity of L3. No acute osseous abnormality. No focal soft tissue abnormality. IMPRESSION: 1. Mild right-sided hydroureteronephrosis without obstructing calculus identified. 2. Diffuse colonic diverticulosis without evidence of diverticulitis. Large amount of stool in the rectosigmoid colon. 3. Percutaneous gastrostomy tube in appropriate position without surrounding fluid collection; mild gastric wall thickening at the insertion site, likely reactive. 4. Diffuse sclerotic osseous metastatic disease again noted; stable chronic compression deformity of L3. Electronically signed by: Greig Pique MD 04/29/2024 06:10 PM EST RP Workstation: [id number]   DG Chest Port 1 View Result Date: 04/29/2024 EXAM: 1 VIEW(S) XRAY OF THE CHEST 04/29/2024 03:34:00 PM COMPARISON: Chest x-ray 02/21/2024. CLINICAL HISTORY: fever, vomiting FINDINGS: LINES, TUBES AND DEVICES: Right chest port catheter tip in the distal SVC, unchanged. LUNGS AND PLEURA: No focal pulmonary opacity. No pulmonary edema. No pleural effusion. No pneumothorax. HEART AND MEDIASTINUM: No acute abnormality of the cardiac and mediastinal silhouettes. BONES AND SOFT TISSUES: No acute osseous abnormality. IMPRESSION: 1. No acute process. 2. Right chest port catheter tip in the distal SVC, unchanged. Electronically signed by: Greig Pique MD 04/29/2024 03:52 PM EST RP Workstation: [id number]   DG Abd 1 View Result Date: 04/14/2024 EXAM: 1 VIEW XRAY OF THE ABDOMEN 04/14/2024 12:59:00 AM COMPARISON: None available. CLINICAL HISTORY: [id number] Gastrostomy tube dysfunction (HCC) [id number].  Gastrostomy tube dysfunction Gastrostomy tube dysfunction FINDINGS: LINES, TUBES AND DEVICES: PEG tube is seen in the left upper quadrant. Chest port  is partially imaged. BOWEL: Nonobstructive bowel gas pattern. There is a moderate to large amount of stool throughout the colon. SOFT TISSUES: No opaque urinary calculi. BONES: No acute osseous abnormality. IMPRESSION: 1. No bowel obstruction. 2. Moderate to large amount of stool throughout the colon. Electronically signed by: Greig Pique MD 04/14/2024 01:06 AM EDT RP Workstation: [id number]   IR Replc Gastro/Colonic Tube Percut W/Fluoro Result Date: 04/12/2024 INDICATION: PEG tube replacement Leaking catheter. History of pancreatic mass with duodenal ureteral stricture/obstruction. Surgical gastrostomy and jejunostomy tubes placed 01/10/2024. EXAM: FLUOROSCOPIC GUIDED EXCHANGE OF GASTROSTOMY TUBE COMPARISON:  CT CAP, 04/09/2024.  IR FLUOROSCOPY, 02/03/2024. MEDICATIONS: None. CONTRAST:  15mL OMNIPAQUE  IOHEXOL  300 MG/ML SOLN - administered into the gastric lumen FLUOROSCOPY: Radiation Exposure Index and estimated peak skin dose (PSD); Reference air kerma (RAK), 2 mGy. COMPLICATIONS: None immediate. PROCEDURE: Informed written consent was obtained from the patient and/or patient's representative after a discussion of the risks, benefits and alternatives to treatment. Questions regarding the procedure were encouraged and answered. A timeout was performed prior to the initiation of the procedure. The upper abdomen and external portion of the existing gastrostomy tube was prepped and draped in the usual sterile fashion, and a sterile drape was applied covering the operative field. Maximum barrier sterile technique with sterile gowns and gloves were used for the procedure. A timeout was performed prior to the initiation of the procedure. The existing gastrostomy tube was injected with a small amount of contrast confirming appropriate positioning within the gastric  lumen. The external portion of the gastrostomy tube was cut decompressing the retention balloon. Next, the gastrostomy tube was cannulated with a guidewire which was coiled within the gastric fundus. Over the stiff guide wire, the existing 28 Fr gastrostomy tube was exchanged for a new, slightly larger, 30 Fr balloon inflatable gastrostomy tube. The balloon was inflated with saline and dilute contrast and pulled against the anterior inner lumen of the stomach and the external disc was cinched. Contrast was injected and a post procedural spot fluoroscopic image was obtained confirming appropriate positioning and functionality of the new gastrostomy tube. A dressing was applied. The patient tolerated the procedure well without immediate postprocedural complication. IMPRESSION: Successful fluoroscopic-guided exchange and upsize for a new 30 Fr gastrostomy tube. The gastrostomy tube is ready for immediate use. RECOMMENDATIONS: The patient will return to V[facility]iology (VIR) for routine feeding tube evaluation and exchange in 6 months. Thom Hall, MD [facility] 2 Radiology Electronically Signed   By: Thom Hall M.D.   On: 04/12/2024 13:22   CT CHEST ABDOMEN PELVIS W CONTRAST Result Date: 04/09/2024 EXAM: CT CHEST, ABDOMEN AND PELVIS WITH CONTRAST 04/09/2024 12:41:00 PM TECHNIQUE: CT of the chest, abdomen and pelvis was performed with the administration of 100 mL of iohexol  (OMNIPAQUE ) 300 MG/ML solution. Multiplanar reformatted images are provided for review. Automated exposure control, iterative reconstruction, and/or weight based adjustment of the mA/kV was utilized to reduce the radiation dose to as low as reasonably achievable. COMPARISON: CT abdomen and pelvis 04/01/2024. CLINICAL HISTORY: Abdominal abscess (Ped 0-17y); metastatic cancer, now with fever. Suspect abdominal wall abscess. Exam time extended due to patient transport by CT staff.  FINDINGS: CHEST: MEDIASTINUM AND LYMPH NODES: Right chest wall access port-a-cath with tip terminating in the right atrium. Heart and pericardium are unremarkable. The central airways are clear. No mediastinal, hilar or axillary lymphadenopathy. LUNGS AND PLEURA: Limited evaluation of the lungs due to motion artifact. No central pulmonary embolus with limited evaluation more  distally due to motion artifact and timing of contrast. No focal consolidation or pulmonary edema. No pleural effusion or pneumothorax. ABDOMEN AND PELVIS: LIMITATIONS: Limited evaluation of the abdomen and pelvis due to motion artifact. LIVER: The liver is enlarged, measuring up to 21 cm. GALLBLADDER AND BILE DUCTS: Gallbladder is unremarkable. No biliary ductal dilatation. SPLEEN: No acute abnormality. PANCREAS: No acute abnormality. ADRENAL GLANDS: No acute abnormality. KIDNEYS, URETERS AND BLADDER: No stones in the kidneys or ureters. No hydronephrosis. No perinephric or periureteral stranding. Urinary bladder is unremarkable. No filling defects of the partially visualized collecting systems on delayed imaging. GI AND BOWEL: Question left lower quadrant ileostomy. Small hiatal hernia. Gastrostomy tube in appropriate position with tip and inflated balloon within the gastric lumen. Question interval development of pyloric and first portion of the duodenum trace bowel wall thickening and slight irregularity with markedly limited evaluation due to motion artifact. Colonic diverticulosis. No large bowel wall thickening or dilatation. The appendix is unremarkable. No small bowel dilatation. There is no bowel obstruction. REPRODUCTIVE ORGANS: Status post hysterectomy. No adnexal mass. 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 No ascites. No free air. VASCULATURE: Aorta is normal in caliber. ABDOMINAL AND PELVIS LYMPH NODES: No lymphadenopathy. BONES AND SOFT TISSUES: Right breast lumpectomy. Questions mild  dermal thickening and edema of the left breast (2.58). Diffuse mild-appearing soft tissue edema the the lower abdominal wall . Resolution of surrounding inflammatory changes along the left lateral abdominal wall musculature. Diffuse sclerotic changes metastatic lesions of the appendicular and axial skeleton. Chronic stable L3 fracture. No acute pathologic fracture identified. No focal soft tissue abnormality. IMPRESSION: 1. Questions mild dermal thickening and edema of the left breast. Recommend correlation with physical exam. 2. Question interval development of pyloric and first portion of the duodenum trace bowel wall thickening and slight irregularity with markedly limited evaluation due to motion artifact. This may represent artifact. 3. 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 4. Diffuse sclerotic changes metastatic lesions of the appendicular and axial skeleton. 5. Colonic diverticulosis with no acute diverticulitis. Electronically signed by: Morgane Naveau MD 04/09/2024 09:16 PM EDT RP Workstation: [id number]   DG Abd 2 Views Result Date: 04/04/2024 EXAM: 2 VIEW XRAY OF THE ABDOMEN 04/04/2024 05:02:00 PM COMPARISON: Abdominal x-ray 09/325. CT abdomen and pelvis 06/14/2024. CLINICAL HISTORY: Vomiting. Patient reports long episodes of nausea and vomiting starting today. FINDINGS: LINES, TUBES AND DEVICES: PEG tube in place. BOWEL: Nonobstructive bowel gas pattern. SOFT TISSUES: Phleboliths in the pelvis. No opaque urinary calculi. BONES: Diffuse patchy sclerotic densities are seen throughout the osseous structures compatible with metastatic disease, unchanged. IMPRESSION: 1. No acute abdominal abnormality. 2. Diffuse patchy sclerotic osseous metastases, unchanged. Electronically signed by: Greig Pique MD 04/04/2024 05:21 PM EDT RP Workstation: [id number]   VAS US  LOWER EXTREMITY VENOUS (DVT) Result Date: 04/02/2024  Lower Venous DVT Study Patient Name:  Chelsea Hansen  Date of Exam:    04/02/2024 Medical Rec #: [id number]      Accession #:    [id number] Date of Birth: 10-01-1970     Patient Gender: F Patient Age:   20 years Exam Location:  [facility] Procedure:      VAS US  LOWER EXTREMITY VENOUS (DVT) Referring Phys: EDITHA RATHORE --------------------------------------------------------------------------------  Indications: Swelling.  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 Comparison Study: No prior study on file Performing Technologist: Alberta Lis RVS  Examination Guidelines: A complete evaluation includes  B-mode imaging, spectral Doppler, color Doppler, and power Doppler as needed of all accessible portions of each vessel. Bilateral testing is considered an integral part of a complete examination. Limited examinations for reoccurring indications may be performed as noted. The reflux portion of the exam is performed with the patient in reverse Trendelenburg.  +-----+---------------+---------+-----------+----------+--------------+ RIGHTCompressibilityPhasicitySpontaneityPropertiesThrombus Aging +-----+---------------+---------+-----------+----------+--------------+ CFV  Full           Yes      Yes                                 +-----+---------------+---------+-----------+----------+--------------+ SFJ  Full                                                        +-----+---------------+---------+-----------+----------+--------------+   +---------+---------------+---------+-----------+----------+-------------------+ LEFT     CompressibilityPhasicitySpontaneityPropertiesThrombus Aging      +---------+---------------+---------+-----------+----------+-------------------+ CFV      Full           Yes      Yes                                      +---------+---------------+---------+-----------+----------+-------------------+ SFJ       Full                                                             +---------+---------------+---------+-----------+----------+-------------------+ FV Prox  Full           Yes      Yes                                      +---------+---------------+---------+-----------+----------+-------------------+ FV Mid   Full                                                             +---------+---------------+---------+-----------+----------+-------------------+ FV DistalFull                                                             +---------+---------------+---------+-----------+----------+-------------------+ PFV      Full           Yes      Yes                                      +---------+---------------+---------+-----------+----------+-------------------+ POP                     Yes      Yes  patent by color and                                                       Doppler             +---------+---------------+---------+-----------+----------+-------------------+ PTV      Full                                                             +---------+---------------+---------+-----------+----------+-------------------+ PERO     Full                                                             +---------+---------------+---------+-----------+----------+-------------------+     Summary: RIGHT: - No evidence of common femoral vein obstruction.   LEFT: - There is no evidence of deep vein thrombosis in the lower extremity.  - No cystic structure found in the popliteal fossa.  *See table(s) above for measurements and observations. Electronically signed by Lonni Gaskins MD on 04/02/2024 at 3:47:47 PM.    Final    CT HEAD WO CONTRAST ( ) Result Date: 04/02/2024 CLINICAL DATA:  Recent fall with headaches, initial encounter EXAM: CT HEAD WITHOUT CONTRAST TECHNIQUE: Contiguous axial images were obtained from the base of the skull through the  vertex without intravenous contrast. RADIATION DOSE REDUCTION: This exam was performed according to the departmental dose-optimization program which includes automated exposure control, adjustment of the mA and/or kV according to patient size and/or use of iterative reconstruction technique. COMPARISON:  None Available. FINDINGS: Brain: No evidence of acute infarction, hemorrhage, hydrocephalus, extra-axial collection or mass lesion/mass effect. Vascular: No hyperdense vessel or unexpected calcification. Skull: Normal. Negative for fracture or focal lesion. Sinuses/Orbits: No acute finding. Other: None. IMPRESSION: No acute intracranial abnormality noted. Electronically Signed   By: Oneil Devonshire M.D.   On: 04/02/2024 01:22   DG Knee Complete 4 Views Right Result Date: 04/01/2024 CLINICAL DATA:  Right knee pain following fall, initial encounter EXAM: RIGHT KNEE - COMPLETE 4+ VIEW COMPARISON:  None Available. FINDINGS: No evidence of fracture, dislocation, or joint effusion. No evidence of arthropathy or other focal bone abnormality. Soft tissues are unremarkable. IMPRESSION: No acute abnormality noted. Electronically Signed   By: Oneil Devonshire M.D.   On: 04/01/2024 22:44   CT ABDOMEN PELVIS W CONTRAST Result Date: 04/01/2024 CLINICAL DATA:  Abdominal pain EXAM: CT ABDOMEN AND PELVIS WITH CONTRAST TECHNIQUE: Multidetector CT imaging of the abdomen and pelvis was performed using the standard protocol following bolus administration of intravenous contrast. RADIATION DOSE REDUCTION: This exam was performed according to the departmental dose-optimization program which includes automated exposure control, adjustment of the mA and/or kV according to patient size and/or use of iterative reconstruction technique. CONTRAST:  OMNIPAQUE  IOHEXOL  300 MG/ML  SOLN COMPARISON:  03/05/2024 FINDINGS: Lower chest: Small right pleural effusion. Hepatobiliary: No focal hepatic abnormality. Gallbladder unremarkable. Pancreas:  No focal abnormality or ductal dilatation. Spleen: No focal abnormality.  Normal size. Adrenals/Urinary  Tract: No suspicious renal or adrenal abnormality. No stones or hydronephrosis. Urinary bladder unremarkable. Stomach/Bowel: Colonic diverticulosis. No active diverticulitis. Gastrostomy tube within the stomach. Small bowel decompressed. No bowel obstruction or inflammatory process. Vascular/Lymphatic: No evidence of aneurysm or adenopathy. Reproductive: Prior hysterectomy.  No adnexal masses. Other: No free fluid or free air. Omental infiltration again noted anteriorly, stable since prior study compatible with peritoneal carcinomatosis. Left catheter tract again noted in the left abdominal wall with small gas and fluid collection in the region of the left oblique muscles, now measuring approximately 1.6 x 1 cm compared to 2.2 by 0.5 cm previously. Musculoskeletal: Diffuse mixed lytic and sclerotic metastatic disease throughout the osseous structures. This is unchanged. IMPRESSION: Small right pleural effusion. Stable omental infiltration compatible with peritoneal carcinomatosis. Further decreased size of the small fluid collection in the left abdominal wall in the area of the left oblique muscles which appears to communicate with the catheter tract in the left abdominal wall. Colonic diverticulosis. Electronically Signed   By: Franky Crease M.D.   On: 04/01/2024 20:45    Labs:  CBC: Recent Labs    04/17/24 2358 04/29/24 1520 04/29/24 1532 04/29/24 2302 04/30/24 0848  WBC 10.3 3.2*  --  4.2 3.1*  HGB 8.4* 9.1* 9.9* 8.7* 7.3*  HCT 25.8* 28.8* 29.0* 28.0* 22.9*  PLT 97* 169  --  136* 119*    COAGS: Recent Labs    12/10/23 2004 12/22/23 2233 02/02/24 0615 02/03/24 0337 02/27/24 1845 03/24/24 0030  INR 1.1   < > 1.2 1.2 1.2 1.1  APTT 28  --   --   --   --   --    < > = values in this interval not displayed.    BMP: Recent Labs    04/19/24 0845 04/29/24 1520 04/29/24 1532  04/29/24 2301 04/29/24 2312 04/30/24 0840  NA 137 135 136 138  --  138  K 3.3* 3.3* 3.2* 3.1*  --  3.6  CL 99 99 98 105  --  106  CO2 29 23  --  21*  --  24  GLUCOSE 119* 89 91 108*  --  104*  BUN <5* 7 6 <5*  --  <5*  CALCIUM  6.6* 5.0*  --  5.2*  --  5.4*  CREATININE 0.31* 0.38* 0.40* 0.38* 0.38* 0.37*  GFRNONAA >60 >60  --  >60 >60 >60    LIVER FUNCTION TESTS: Recent Labs    04/10/24 0717 04/14/24 0458 04/16/24 0433 04/29/24 1520  BILITOT 1.1 0.4 0.4 0.7  AST 27 16 18 19   ALT 29 24 31 16   ALKPHOS 470* 365* 388* 241*  PROT 5.2* 5.4* 5.4* 6.8  ALBUMIN  3.0* 3.0* 3.1* 3.5    TUMOR MARKERS: No results for input(s): AFPTM, CEA, CA199, CHROMGRNA in the last 8760 hours.  Assessment and Plan: Metastatis breast cancer Small bowel obstructions Peritoneal carcinomatosis  At bedside today: -Replaced the bumper and cinched as tightly as patient could tolerate.  -Ensured balloon was intact and abutting the abdominal wall. -Placed A SINGLE LAYER of gauze underneath the bumper so as not encourage distancing.  -Placed a thin dressing with minimal tape which will most likely need to replaced within the hour.    Recommendations: -Butt paste/barrier cream as able around the G-tube site.  -Minimize dressing or layers of gauze. Ok to place dressing over top of the tube, but none underneath bumper.  -Intermittent trial of bowel rest or at least a liquid diet for several  days.  -LIWS to encourage contents through the tube and minimize leaking.  -Silver  nitrate to granuloma if able.    Expectations: Patient has advanced metastatic cancer causing an intestinal obstruction.  She has had multiple tube exchanges, now with a large bore 30Fr tube.  Despite upsizing she has continued to have poorly controlled leakage and further skin breakdown. Most likely, as her tubes have been exchanged and upsized this has allowed for a larger hole for bowel contents to leak through.  Doubtful her  leakage will ever be zero.  Goal is to minimize and contain while allowing her the most liberalized diet that can accomplish comfort in both PO intake as well as wound management.  This is certainly a difficult situation as eating appears to be a great source of comfort.  She is asking if the tube can be removed and another ostomy bag placed over the hole.  Would need to at least make sure skin care is maximized.  Unfortunately, healing will be limited especially given her ongoing advanced disease and steroid use.   Thank you for this interesting consult.  I greatly enjoyed meeting Chelsea Hansen and look forward to participating in their care.  A copy of this report was sent to the requesting provider on this date.  Electronically Signed: Clifton Kovacic Sue-Ellen Eulon Allnutt, PA 04/30/2024, 5:15 PM   I spent a total of 40 Minutes    in face to face in clinical consultation, greater than 50% of which was counseling/coordinating care for peritoneal carcinomatosis, intestinal obstructions, leaking G-tube.

## 2024-04-30 NOTE — Consult Note (Signed)
 Referring Provider: TRH, Dr. Franky Primary Care Physician:  [name], NP Primary Gastroenterologist:  Dr. Aneita previously  Reason for Consultation:  Intractable nausea and vomiting  HPI: Chelsea Hansen is a 53 y.o. female with a history of metastatic breast cancer with peritoneal carcinomatosis, chronic kidney disease stage IIIa, SVT, asthma, hypertension, esophagitis, esophageal stricture, gastritis, duodenitis, duodenal stricture s/p dilatation, duodenal obstruction (previous concern for pancreatic lesion could not able to be visualized due to duodenal stenosis) leading to gastrojejunostomy bypass surgery 12/2023 with complications of abdominal fluid collections with numerous hospital admissions with intractable nausea and vomiting.   She was seen by our service less than 1 month ago at which time palliative care also evaluated her and was trying to manage her nausea and vomiting.  Our team did not think that there was much to offer and that there is no role of endoscopic evaluation.  Was thought to be multifactorial due to carcinomatosis, gastroduodenostomy and hypocalcemia.  It looks like she had been started on Decadron  and Zyprexa  as well as as needed Compazine .  No longer on those medications.  She tells me she was doing fine until about 3 days ago when she started throwing up again.  She says that she has continued to eat and does not seem to be throwing up her food, just liquids several hours after eating.  She would like to eat now and that was one of the first questions that she asked me when I walked into the room was if she could eat.  Continues to have significant drainage/leakage of her G-tube and IR has been consulted.  Has generalized abdominal pain, unchanged from previous.  CT scan abdomen and pelvis with contrast on 04/29/2024: IMPRESSION: 1. Mild right-sided hydroureteronephrosis without obstructing calculus identified. 2. Diffuse colonic diverticulosis without  evidence of diverticulitis. Large amount of stool in the rectosigmoid colon. 3. Percutaneous gastrostomy tube in appropriate position without surrounding fluid collection; mild gastric wall thickening at the insertion site, likely reactive. 4. Diffuse sclerotic osseous metastatic disease again noted; stable chronic compression deformity of L3.   MOST RECENT GI PROCEDURES:   EGD 03/08/2024:  Impression: EGD impression: - No gross lesions in the proximal esophagus and in the mid esophagus. LA Grade B esophagitis with no bleeding noted distally ( this is better than prior) . - Patent gastrostomy with no G- tube present characterized by healthy appearing mucosa. - A gastrojejunostomy was found, characterized by inflammation, congestion, erosion and an intact staple line ( at staple line that was visualized there were congested erosive changes which is a reason that 7 of the staples were removed) . - Normal antrum, prepyloric region of the stomach and pylorus. - No gross lesions in the duodenal bulb. 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  Endoscopic ultrasound 03/08/2024:  EUS impression: - Pancreatic parenchymal abnormalities consisting of hyperechoic strands were noted in the pancreatic head. - The pancreatic duct had a dilated endosonographic appearance in the pancreatic head and genu of the pancreas. The pancreatic duct measured up to 4. 0 mm in diameter in the head and neck. - There was dilation in the common bile duct and in the common hepatic duct which measured up to 14 mm. - Ascites was found on endosonographic examination of the peritoneal cavity ( perihepatic/ periduodenal) . - Unfortunately I cannot position the endoscope to be able to visualize the major papilla/ ampulla/ uncinate  process of the pancreas. This is a result of the significant duodenal stenosis in the duodenal sweep/ angle.  EGD/EUS  12/28/2023:   EGD impression:  - No gross lesions in the proximal esophagus.  - LA Grade C esophagitis with no bleeding found distally (appears improved from prior).  - Z-line irregular, 39 cm from the incisors.  - 3 cm hiatal hernia.  - Retained gastric fluid. 900 cc removed on today's procedure.  - J-shaped and dilation deformity in the entire stomach. - Erythematous mucosa in the stomach. Previously biopsied and negative for HP.  - No gross lesions in the duodenal bulb.  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 Biopsied.    EUS impression:  - Pancreatic parenchymal abnormalities consisting of diffuse echogenicity and lobularity were noted in the pancreatic head, genu of the pancreas, pancreatic body and pancreatic tail. No overt mass/lesion was noted in this particular regions visualized. Uncinate process not able to be visualized.  - The pancreatic duct within the head was prominent and was dilated in the genu of the pancreas, body of the pancreas and tail of the pancreas.  - There was a normal-appearing common bile duct and prominent/slightly dilated common hepatic duct.  - No malignant-appearing lymph nodes were visualized in the celiac region (level 20), peripancreatic region and porta hepatis region.   EGD 12/27/2023: - No gross lesions in the proximal esophagus.  - LA Grade C esophagitis with no bleeding found distally.  - 2 cm hiatal hernia.  - Retained gastric fluid.  - Erythematous mucosa in the stomach. No change from yesterday.  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 Dilated under fluoroscopy up to 12 mm. It is felt that this duodenal stenosis is greater than 5 cm in length. Unable to traverse even after dilation.    Past Medical History:  Diagnosis Date   Allergy    seasonal allergies   Anxiety    on meds   Asthma    uses inhaler   Breast cancer (HCC) 2012   RIGHT lumpectomy    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 Lynwood Nasuti   Hyperlipidemia    on meds   Hypertension    on meds   Peritoneal carcinomatosis (HCC)    S/P percutaneous endoscopic gastrostomy (PEG) tube placement (HCC)    SVT (supraventricular tachycardia)     Past Surgical History:  Procedure Laterality Date   ABDOMINAL HYSTERECTOMY     AXILLARY SENTINEL NODE BIOPSY Left 07/09/2021   Procedure: LEFT AXILLARY SENTINEL NODE BIOPSY;  Surgeon: [name], MD;  Location: [facility];  Service: General;  Laterality: Left;   BALLOON DILATION N/A 12/13/2023   Procedure: MERRILL HODGKIN;  Surgeon: Federico Rosario BROCKS, MD;  Location: [facility] ENDOSCOPY;  Service: Gastroenterology;  Laterality: N/A;   BIOPSY  06/29/2022   Procedure: BIOPSY;  Surgeon: Legrand Victory CROME III,  MD;  Location: [facility];  Service: Gastroenterology;;   BIOPSY OF SKIN SUBCUTANEOUS TISSUE AND/OR MUCOUS MEMBRANE  12/13/2023   Procedure: BIOPSY, SKIN, SUBCUTANEOUS TISSUE, OR MUCOUS MEMBRANE;  Surgeon: Federico Rosario BROCKS, MD;  Location: [facility];  Service: Gastroenterology;;   BREAST EXCISIONAL BIOPSY Right 09/16/2009   BREAST LUMPECTOMY WITH RADIOACTIVE SEED LOCALIZATION Bilateral 07/09/2021   Procedure: BILATERAL BREAST LUMPECTOMY WITH RADIOACTIVE SEED LOCALIZATION;  Surgeon: [name], MD;  Location: [facility];  Service: General;  Laterality: Bilateral;   BREAST SURGERY     lumpectomy    ESOPHAGOGASTRODUODENOSCOPY N/A 12/13/2023   Procedure: EGD (ESOPHAGOGASTRODUODENOSCOPY);  Surgeon: Federico Rosario BROCKS, MD;  Location: [facility];  Service: Gastroenterology;  Laterality: N/A;   ESOPHAGOGASTRODUODENOSCOPY N/A 12/26/2023   Procedure: EGD (ESOPHAGOGASTRODUODENOSCOPY);  Surgeon: Wilhelmenia Aloha Raddle., MD;  Location: [facility];  Service: Gastroenterology;  Laterality: N/A;   ESOPHAGOGASTRODUODENOSCOPY N/A 12/27/2023   Procedure: EGD (ESOPHAGOGASTRODUODENOSCOPY);  Surgeon: Wilhelmenia Aloha Raddle., MD;  Location: [facility];  Service: Gastroenterology;  Laterality: N/A;   ESOPHAGOGASTRODUODENOSCOPY N/A 03/08/2024   Procedure: EGD (ESOPHAGOGASTRODUODENOSCOPY);  Surgeon: Wilhelmenia Aloha Raddle., MD;  Location: [facility];  Service: Gastroenterology;  Laterality: N/A;   ESOPHAGOGASTRODUODENOSCOPY (EGD) WITH PROPOFOL  N/A 06/29/2022   Procedure: ESOPHAGOGASTRODUODENOSCOPY (EGD) WITH PROPOFOL ;  Surgeon: Legrand Victory LITTIE DOUGLAS, MD;  Location: [facility];  Service: Gastroenterology;  Laterality: N/A;   EUS N/A 12/26/2023   Procedure: ULTRASOUND, UPPER GI TRACT, ENDOSCOPIC;  Surgeon: Wilhelmenia Aloha Raddle., MD;  Location: [facility];  Service: Gastroenterology;  Laterality: N/A;   EUS N/A 12/27/2023   Procedure: ULTRASOUND, UPPER GI TRACT, ENDOSCOPIC;  Surgeon: Wilhelmenia Aloha Raddle., MD;  Location: [facility];  Service: Gastroenterology;  Laterality: N/A;   EUS N/A 03/08/2024   Procedure: ULTRASOUND, UPPER GI TRACT, ENDOSCOPIC;  Surgeon: Wilhelmenia Aloha Raddle., MD;  Location: [facility];  Service: Gastroenterology;  Laterality: N/A;   IR FLUORO PROCEDURE UNLISTED  01/20/2024   IR IMAGING GUIDED PORT INSERTION  02/06/2024   IR REPLC DUODEN/JEJUNO TUBE PERCUT W/FLUORO  02/03/2024   IR REPLC GASTRO/COLONIC TUBE PERCUT W/FLUORO  01/20/2024   IR REPLC GASTRO/COLONIC TUBE PERCUT W/FLUORO  04/12/2024   LAPAROSCOPIC INSERTION GASTROSTOMY TUBE N/A 01/10/2024   Procedure: laparoscopic insertion of  gastrostomy tube, laparoscopic insertion of jejunostomy tube, upper endoscopy;  Surgeon: Kinsinger, Herlene Righter, MD;  Location: [facility];  Service: General;  Laterality: N/A;  Laproscopic insertion   LAPAROSCOPIC ROUX-EN-Y GASTRIC BYPASS WITH HIATAL HERNIA REPAIR N/A 01/02/2024   Procedure: LAPAROSCOPIC CREATION GASTRJEJONOSTOMY;  Surgeon: Lyndel Deward PARAS, MD;  Location: [facility];  Service: General;  Laterality: N/A;  GASTROJEJUNOSTOMY   PORT-A-CATH REMOVAL Left 07/09/2021   Procedure: REMOVAL PORT-A-CATH;  Surgeon: [name], MD;  Location: [facility];  Service: General;  Laterality: Left;   PORTACATH PLACEMENT Left 01/26/2021   Procedure: INSERTION PORT-A-CATH;  Surgeon: [name], MD;  Location: [facility];  Service: General;  Laterality: Left;   RADIOACTIVE SEED GUIDED AXILLARY SENTINEL LYMPH NODE Right 07/09/2021   Procedure: RADIOACTIVE SEED GUIDED RIGHT AXILLARY SENTINEL LYMPH NODE DISSECTION;  Surgeon: [name], MD;  Location: [facility];  Service: General;  Laterality: Right;   TUBAL LIGATION      Prior to Admission medications   Medication Sig Start Date End Date Taking? Authorizing Provider  albuterol  (VENTOLIN  HFA) 108 (90 Base) MCG/ACT inhaler Inhale 2 puffs into the lungs every 6 (six) hours as needed for wheezing or shortness of breath.   Yes [provider]  calcitRIOL  (ROCALTROL )  0.25 MCG capsule Take 1 capsule (0.25 mcg total) by mouth 2 (two) times daily. 03/30/24  Yes Causey, Morna Pickle, NP  fentaNYL  (DURAGESIC ) 75 MCG/HR Place 1 patch onto the skin every 3 (three) days. 04/20/24  Yes Lue Elsie BROCKS, MD  magnesium  oxide (MAG-OX) 400 (240 Mg) MG tablet Take 1 tablet (400 mg total) by mouth 2 (two) times daily. 03/28/24 06/26/24 Yes Austria, Eric J, DO  ondansetron  (ZOFRAN -ODT) 4 MG disintegrating tablet Dissolve 1 tablet by mouth every 4 hours as needed for nausea or vomiting 04/25/24  Yes   oxyCODONE   (ROXICODONE ) 15 MG immediate release tablet Take 1 tablet (15 mg total) by mouth every 4 (four) hours as needed for pain. 04/20/24  Yes   Calcium  Carb-Cholecalciferol  (CALCIUM  600+D) 600-10 MG-MCG TABS Take 1 tablet by mouth 3 (three) times daily with meals. Patient not taking: Reported on 04/29/2024 03/30/24   [name], NP  calcium  carbonate (OS-CAL - DOSED IN MG OF ELEMENTAL CALCIUM ) 1250 (500 Ca) MG tablet Take 5 tablets (6,250 mg total) by mouth 3 (three) times daily with meals. 04/19/24   Lue Elsie BROCKS, MD  ergocalciferol , VITAMIN D2, (DRISDOL ) 200 MCG/ML drops Take 1 mL (8,000 Units total) by mouth daily. 04/19/24   Lue Elsie BROCKS, MD  feeding supplement (ENSURE PLUS HIGH PROTEIN) LIQD Take 237 mLs by mouth 2 (two) times daily between meals. Patient not taking: Reported on 04/29/2024 04/19/24   Lue Elsie BROCKS, MD  hydrocortisone (CORTEF) 10 MG tablet Take 5 tablets by mouth 2 times daily for 7 days, THEN take 2 tablets twice daily for 7 days, THEN take 1 tablet twice daily for 7 days, THEN take 1 tablet once daily for 7 days. 04/19/24 05/17/24  Lue Elsie BROCKS, MD  methadone (DOLOPHINE) 5 MG tablet Take 1 tablet by mouth three times a day 04/24/24     Multiple Vitamin (MULTIVITAMIN WITH MINERALS) TABS tablet Take 1 tablet by mouth daily. 04/19/24   Lue Elsie BROCKS, MD  Nystatin (GERHARDT'S BUTT CREAM) CREA Apply topically 3 (three) times daily. 04/19/24   Lue Elsie BROCKS, MD  OLANZapine  zydis (ZYPREXA ) 5 MG disintegrating tablet Take 1 tablet (5 mg total) by mouth at bedtime. 04/19/24   Lue Elsie BROCKS, MD  omeprazole  (PRILOSEC) 40 MG capsule Take 1 capsule (40 mg total) by mouth daily. 04/19/24   Lue Elsie BROCKS, MD  phosphorus (K PHOS  NEUTRAL) 155-852-130 MG tablet Take 1 tablet (250 mg total) by mouth 3 (three) times daily. 04/19/24   Lue Elsie BROCKS, MD  potassium chloride  (KLOR-CON ) 20 MEQ packet Take 40 mEq by mouth daily.  04/19/24   Lue Elsie BROCKS, MD  sorbitol  70 % SOLN Take 30 mLs by mouth daily at 6 (six) AM. 04/20/24   Lue Elsie BROCKS, MD    Current Facility-Administered Medications  Medication Dose Route Frequency Provider Last Rate Last Admin   calcium  carbonate (TUMS - dosed in mg elemental calcium ) chewable tablet 200 mg of elemental calcium   200 mg of elemental calcium  Oral TID Franky Redia SAILOR, MD       calcium  gluconate 930 mg of elemental calcium  in sodium chloride  0.9 % 1,000 mL infusion  0.5 mg/kg/hr of elemental calcium  Intravenous Continuous Franky Redia SAILOR, MD 36.7 mL/hr at 04/30/24 0200 0.5 mg/kg/hr of elemental calcium  at 04/30/24 0200   ceFEPIme  (MAXIPIME ) 2 g in sodium chloride  0.9 % 100 mL IVPB  2 g Intravenous Q8H Shade, Christine E, RPH 200 mL/hr at 04/30/24 0549 2 g  at 04/30/24 0549   Chlorhexidine  Gluconate Cloth 2 % PADS 6 each  6 each Topical Daily Patt Alm Macho, MD   6 each at 04/29/24 2020   dextrose  5% in lactated ringers  with KCl 20 mEq/L infusion   Intravenous Continuous Franky Redia SAILOR, MD 100 mL/hr at 04/30/24 0007 New Bag at 04/30/24 0007   enoxaparin  (LOVENOX ) injection 40 mg  40 mg Subcutaneous Q24H Franky Redia SAILOR, MD   40 mg at 04/29/24 2259   fentaNYL  (SUBLIMAZE ) injection 50 mcg  50 mcg Intravenous Q4H PRN Franky Redia SAILOR, MD   50 mcg at 04/30/24 9375   prochlorperazine  (COMPAZINE ) injection 5 mg  5 mg Intravenous Q6H PRN Franky Redia SAILOR, MD   5 mg at 04/29/24 2354   sodium chloride  flush (NS) 0.9 % injection 10-40 mL  10-40 mL Intracatheter PRN Patt Alm Macho, MD   10 mL at 04/30/24 0847   vancomycin  (VANCOCIN ) IVPB 1000 mg/200 mL premix  1,000 mg Intravenous Q12H Sharlot Wanda BRAVO, RPH 200 mL/hr at 04/30/24 0443 1,000 mg at 04/30/24 0443    Allergies as of 04/29/2024 - Review Complete 04/29/2024  Allergen Reaction Noted   Enhertu  [fam-trastuzumab  deruxtec-nxki] Other (See Comments) 02/16/2024   Xgeva  [denosumab ] Other  (See Comments) 04/05/2024    Family History  Problem Relation Age of Onset   Breast cancer Mother        106s   Hypertension Father    Uterine cancer Maternal Aunt    Ovarian cancer Maternal Aunt    Breast cancer Maternal Aunt        11s   Breast cancer Cousin 46   Uterine cancer Cousin 50   Colon polyps Neg Hx    Colon cancer Neg Hx    Esophageal cancer Neg Hx    Stomach cancer Neg Hx     Social History   Socioeconomic History   Marital status: Single    Spouse name: Not on file   Number of children: Not on file   Years of education: Not on file   Highest education level: Not on file  Occupational History   Not on file  Tobacco Use   Smoking status: Never   Smokeless tobacco: Never  Vaping Use   Vaping status: Never Used  Substance and Sexual Activity   Alcohol use: Not Currently    Alcohol/week: 7.0 standard drinks of alcohol    Types: 7 Standard drinks or equivalent per week    Comment: occassional   Drug use: No   Sexual activity: Not Currently  Other Topics Concern   Not on file  Social History Narrative   Not on file   Social Drivers of Health   Financial Resource Strain: High Risk (02/13/2024)   Overall Financial Resource Strain (CARDIA)    Difficulty of Paying Living Expenses: Hard  Food Insecurity: No Food Insecurity (04/29/2024)   Hunger Vital Sign    Worried About Running Out of Food in the Last Year: Never true    Ran Out of Food in the Last Year: Never true  Recent Concern: Food Insecurity - Food Insecurity Present (02/16/2024)   Hunger Vital Sign    Worried About Running Out of Food in the Last Year: Sometimes true    Ran Out of Food in the Last Year: Sometimes true  Transportation Needs: No Transportation Needs (04/29/2024)   PRAPARE - Administrator, Civil Service (Medical): No    Lack of Transportation (Non-Medical): No  Physical Activity:  Inactive (02/13/2024)   Exercise Vital Sign    Days of Exercise per Week: 0 days     Minutes of Exercise per Session: 0 min  Stress: No Stress Concern Present (02/13/2024)   [employer] of Occupational Health - Occupational Stress Questionnaire    Feeling of Stress: Not at all  Social Connections: Moderately Isolated (04/01/2024)   Social Connection and Isolation Panel    Frequency of Communication with Friends and Family: More than three times a week    Frequency of Social Gatherings with Friends and Family: Once a week    Attends Religious Services: 1 to 4 times per year    Active Member of [employer] or Organizations: No    Attends Banker Meetings: Never    Marital Status: Never married  Intimate Partner Violence: Not At Risk (04/29/2024)   Humiliation, Afraid, Rape, and Kick questionnaire    Fear of Current or Ex-Partner: No    Emotionally Abused: No    Physically Abused: No    Sexually Abused: No    Review of Systems: ROS is O/W negative except as mentioned in HPI.  Physical Exam: Vital signs in last 24 hours: Temp:  [98 F (36.7 C)-100.2 F (37.9 C)] 98.2 F (36.8 C) (11/10 0258) Pulse Rate:  [74-115] 80 (11/10 0428) Resp:  [8-26] 17 (11/10 0258) BP: (92-123)/(63-94) 92/64 (11/10 0428) SpO2:  [95 %-100 %] 95 % (11/10 0258) Weight:  [68.2 kg-72.6 kg] 68.2 kg (11/09 2226) Last BM Date : 04/29/24 General:  Alert, pleasant and cooperative in NAD Head:  Normocephalic and atraumatic. Eyes:  Sclera clear, no icterus.  Conjunctiva pink. Ears:  Normal auditory acuity. Mouth:  No deformity or lesions.   Lungs:  Clear throughout to auscultation.  No wheezes, crackles, or rhonchi.  Heart:  Regular rate and rhythm; no murmurs, clicks, rubs, or gallops. Abdomen:  Soft, non-distended.  BS present.  Mild diffuse TTP.  G-tube noted with significant drainage/leakage. Msk:  Symmetrical without gross deformities. Pulses:  Normal pulses noted. Extremities:  Without clubbing or edema. Neurologic:  Alert and oriented x 4;  grossly normal  neurologically. Skin:  Intact without significant lesions or rashes. Psych:  Alert and cooperative. Normal mood and affect.  Intake/Output from previous day: 11/09 0701 - 11/10 0700 In: 2944.8 [I.V.:781.8; IV Piggyback:2163] Out: -  Intake/Output this shift: Total I/O In: 10 [I.V.:10] Out: -   Lab Results: Recent Labs    04/29/24 1520 04/29/24 1532 04/29/24 2302 04/30/24 0848  WBC 3.2*  --  4.2 3.1*  HGB 9.1* 9.9* 8.7* 7.3*  HCT 28.8* 29.0* 28.0* 22.9*  PLT 169  --  136* 119*   BMET Recent Labs    04/29/24 1520 04/29/24 1532 04/29/24 2301 04/29/24 2312  NA 135 136 138  --   K 3.3* 3.2* 3.1*  --   CL 99 98 105  --   CO2 23  --  21*  --   GLUCOSE 89 91 108*  --   BUN 7 6 <5*  --   CREATININE 0.38* 0.40* 0.38* 0.38*  CALCIUM  5.0*  --  5.2*  --    LFT Recent Labs    04/29/24 1520  PROT 6.8  ALBUMIN  3.5  AST 19  ALT 16  ALKPHOS 241*  BILITOT 0.7   Studies/Results: CT ABDOMEN PELVIS W CONTRAST Result Date: 04/29/2024 EXAM: CT ABDOMEN AND PELVIS WITH CONTRAST 04/29/2024 05:42:44 PM TECHNIQUE: CT of the abdomen and pelvis was performed with the administration of 100  mL of iohexol  (OMNIPAQUE ) 300 MG/ML solution. Multiplanar reformatted images are provided for review. Automated exposure control, iterative reconstruction, and/or weight-based adjustment of the mA/kV was utilized to reduce the radiation dose to as low as reasonably achievable. COMPARISON: Chest abdomen and pelvis 04/09/2024 CLINICAL HISTORY: Sepsis; ab pain, pururlent drainage from G tube. Patient has hx of omental mets. FINDINGS: LOWER CHEST: There is no patchy atelectasis/airspace disease in the lingula. LIVER: Small cysts are present measuring up to 12 mm. GALLBLADDER AND BILE DUCTS: Gallbladder is unremarkable. No biliary ductal dilatation. SPLEEN: No acute abnormality. PANCREAS: No acute abnormality. ADRENAL GLANDS: No acute abnormality. KIDNEYS, URETERS AND BLADDER: There is mild right-sided  hydroureteronephrosis without obstructing calculus identified. No stones in the kidneys or ureters. No perinephric or periureteral stranding. Urinary bladder is unremarkable. GI AND BOWEL: Percutaneous gastrostomy tube is in the body of the stomach. There is no fluid collection or significant inflammation surrounding the PEG tube. There is some mild gastric wall thickening at the PEG tube insertion site. There is diffuse colonic diverticulosis. There are scattered colonic diverticula. There is a large amount of stool in the rectosigmoid colon. The appendix appears normal. There is no bowel obstruction. PERITONEUM AND RETROPERITONEUM: No ascites. No free air. No discrete peritoneal nodules are identified. VASCULATURE: Aorta is normal in caliber. LYMPH NODES: No lymphadenopathy. REPRODUCTIVE ORGANS: No acute abnormality. BONES AND SOFT TISSUES: Diffuse sclerotic osseous metastatic disease again noted. There is stable chronic compression deformity of L3. No acute osseous abnormality. No focal soft tissue abnormality. IMPRESSION: 1. Mild right-sided hydroureteronephrosis without obstructing calculus identified. 2. Diffuse colonic diverticulosis without evidence of diverticulitis. Large amount of stool in the rectosigmoid colon. 3. Percutaneous gastrostomy tube in appropriate position without surrounding fluid collection; mild gastric wall thickening at the insertion site, likely reactive. 4. Diffuse sclerotic osseous metastatic disease again noted; stable chronic compression deformity of L3. Electronically signed by: Greig Pique MD 04/29/2024 06:10 PM EST RP Workstation: [id number]   DG Chest Port 1 View Result Date: 04/29/2024 EXAM: 1 VIEW(S) XRAY OF THE CHEST 04/29/2024 03:34:00 PM COMPARISON: Chest x-ray 02/21/2024. CLINICAL HISTORY: fever, vomiting FINDINGS: LINES, TUBES AND DEVICES: Right chest port catheter tip in the distal SVC, unchanged. LUNGS AND PLEURA: No focal pulmonary opacity. No pulmonary edema. No  pleural effusion. No pneumothorax. HEART AND MEDIASTINUM: No acute abnormality of the cardiac and mediastinal silhouettes. BONES AND SOFT TISSUES: No acute osseous abnormality. IMPRESSION: 1. No acute process. 2. Right chest port catheter tip in the distal SVC, unchanged. Electronically signed by: Greig Pique MD 04/29/2024 03:52 PM EST RP Workstation: [id number]   IMPRESSION/PLAN:  53 year old female with a history of a duodenal obstruction s/p gastrojejunostomy bypass surgery 12/2023 readmitted with intractable nausea/vomiting. 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 CTAP here without acute intra-abdominal/pelvic pathology to explain her symptoms, no evidence of gastric outlet obstruction/small bowel obstruction, etc.   - During hospitalization last month symptoms thought to be multifactorial due to carcinomatosis, gastroduodenostomy and hypocalcemia. - Defer endoscopic recommendations to Dr. Suzann, but patient wants to eat so I will allow her clear liquid today.  Not sure that EGD will necessarily make a difference in management. - It appears that just last month palliative care was helping to manage her nausea and vomiting and she had improvement on Decadron , Zyprexa , and as needed Compazine .  She seemed to respond to this fairly quickly, but it appears that she is no longer on the Zyprexa  or the Decadron .  Looks  like they have been consulted again. - Await further recommendations per Dr. Avram.   Severe hypocalcemia:  Likely related to recent use of desunomab and vitamin D  deficiency as noted by nephrologist during last admission last month.  Patient has not taken calcitriol  as outpatient.  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  C. difficile and GI pathogen panel negative.  Currently denies diarrhea. - Palliative care previously  considering Octreotide   Anemia of chronic disease, Hgb currently 7.3 grams but no sign of GI bleeding. 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   Harlene BIRCH. Jareli Highland  04/30/2024, 9:28 AM

## 2024-04-30 NOTE — Progress Notes (Signed)
 Darryle Long [address] [facility] Liaison note  Ms. Cia Garretson is a current hospice patient we follow at home, for terminal dx breast cancer of the nipple & areola. Abbye called our agency stating increased pain and vomiting- a nurse was dispatched to her home for a visit.  Suzzane called our agency back stating she was unable to wait any longer because of her pain.  Patient's daughter was taking her to [facility] ED.  Consuelo Bunker, RN/Triage with [facility]- called report to the ED. Per Dr. Norleen Laurence with [facility], this is a related hospital admission. Patient is a full code.   Met with patient at bedside. She advised that her pain was not currently well controlled and that she may be open to transferring to [facility] after she speaks with the inpatient team. We discussed the fact that [facility] would not be checking labs but could treat her symptoms of pain and nausea.   Pt is inpatient appropriate due need for IV pain medication and antibiotics.   VS: 98.3/80/20   110/71    100% on RA   I/O: 2,944.8/none recorderd   Abnormal Labs:  04/30/24  Glucose: 104 (H) BUN: <5 (L) Creatinine: 0.37 (L) Calcium : 5.4 (LL) 04/30/24  Phosphorus: 1.6 (L) Magnesium : 1.4 (L) WBC: 3.1 (L) RBC: 2.64 (L) Hemoglobin: 7.3 (L) HCT: 22.9 (L) RDW: 18.1 (H) Platelets: 119 (L) Lymphs Abs: 0.6 (L)  Diagnostics:  1 VIEW(S) XRAY OF THE CHEST 04/29/2024 03:34:00 PM   COMPARISON: Chest x-ray 02/21/2024.   CLINICAL HISTORY: fever, vomiting   FINDINGS:   LINES, TUBES AND DEVICES: Right chest port catheter tip in the distal SVC, unchanged.   LUNGS AND PLEURA: No focal pulmonary opacity. No pulmonary edema. No pleural effusion. No pneumothorax.   HEART AND MEDIASTINUM: No acute abnormality of the cardiac and mediastinal silhouettes.   BONES AND SOFT TISSUES: No acute osseous abnormality.   IMPRESSION: 1. No acute process. 2. Right chest port catheter tip in  the distal SVC, unchanged.  CT ABDOMEN AND PELVIS WITH CONTRAST 04/29/2024 05:42:44 PM   TECHNIQUE: CT of the abdomen and pelvis was performed with the administration of 100 mL of iohexol  (OMNIPAQUE ) 300 MG/ML solution. Multiplanar reformatted images are provided for review. Automated exposure control, iterative reconstruction, and/or weight-based adjustment of the mA/kV was utilized to reduce the radiation dose to as low as reasonably achievable.   COMPARISON: Chest abdomen and pelvis 04/09/2024   CLINICAL HISTORY: Sepsis; ab pain, pururlent drainage from G tube. Patient has hx of omental mets.   FINDINGS:   LOWER CHEST: There is no patchy atelectasis/airspace disease in the lingula.   LIVER: Small cysts are present measuring up to 12 mm.   GALLBLADDER AND BILE DUCTS: Gallbladder is unremarkable. No biliary ductal dilatation.   SPLEEN: No acute abnormality.   PANCREAS: No acute abnormality.   ADRENAL GLANDS: No acute abnormality.   KIDNEYS, URETERS AND BLADDER: There is mild right-sided hydroureteronephrosis without obstructing calculus identified. No stones in the kidneys or ureters. No perinephric or periureteral stranding. Urinary bladder is unremarkable.   GI AND BOWEL: Percutaneous gastrostomy tube is in the body of the stomach. There is no fluid collection or significant inflammation surrounding the PEG tube. There is some mild gastric wall thickening at the PEG tube insertion site. There is diffuse colonic diverticulosis. There are scattered colonic diverticula. There is a large amount of stool in the rectosigmoid colon. The appendix appears normal. There is no bowel obstruction.  PERITONEUM AND RETROPERITONEUM: No ascites. No free air. No discrete peritoneal nodules are identified.   VASCULATURE: Aorta is normal in caliber.   LYMPH NODES: No lymphadenopathy.   REPRODUCTIVE ORGANS: No acute abnormality.   BONES AND SOFT TISSUES: Diffuse  sclerotic osseous metastatic disease again noted. There is stable chronic compression deformity of L3. No acute osseous abnormality. No focal soft tissue abnormality.   IMPRESSION: 1. Mild right-sided hydroureteronephrosis without obstructing calculus identified. 2. Diffuse colonic diverticulosis without evidence of diverticulitis. Large amount of stool in the rectosigmoid colon. 3. Percutaneous gastrostomy tube in appropriate position without surrounding fluid collection; mild gastric wall thickening at the insertion site, likely reactive. 4. Diffuse sclerotic osseous metastatic disease again noted; stable chronic compression deformity of L3.   IV/PRN meds: Zofran  38m IV x1, morphine  4mg  IV x1, Compazine  5mg  IV x1, oxycodone  15mg  PO x2, dilaudid  1mg  IV x1, fentanyl  50 mcg q4hrs x5, vancomycin  1,000 mg IV x1, cefepime  2g IV x2  Assessment and Plan (per Jadine Toribio SQUIBB, MD 11.10.25): Assessment and Plan: Severe hypocalcemia Thought secondary to recent denosumab .  Seen on last hospitalization by nephrology. Repletion.  Check CMP in a.m.   Intractable nausea and vomiting Resolved.  Patient would like diet.  Etiology unclear.  CT abdomen pelvis unrevealing except for stool in colon.   Concern for sepsis with recent admission for Klebsiella bacteremia No evidence of sepsis at this time.  Blood cultures pending.  Stop antibiotics and monitor.   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  Transfuse as indicated.   Previously seen by palliative care, will involve for this hospitalization as well   Moderate protein calorie malnutrition   Diffuse colonic diverticulosis without evidence of  diverticulitis.  Large amount of stool in the rectosigmoid  colon. Bowel regimen  Discharge Planning: ongoing, home when medically stable   Family Contact: pt speaks for herself   IDT: updated   Goals of Care: Clear, pt is a full code   Medication List and Transfer Summary uploaded to patient chart.   Should patient need ambulance transport at discharge, please call [facility] as we contract with them for our active hospice patients.   Please call with any hospice questions or concerns.   Thank you, Eleanor Nail, [facility] Liaison [phone]

## 2024-04-30 NOTE — Consult Note (Signed)
 Consultation Note Date: 04/30/2024   Patient Name: Chelsea Hansen  DOB: 1970-07-05  MRN: [id number]  Age / Sex: 53 y.o., female  PCP: [name], NP Referring Physician: Jadine Toribio SQUIBB, MD  Reason for Consultation: Establishing goals of care Symptom management of pain, nausea vomiting.   HPI/Patient Profile: 53 y.o. female  admitted on 04/29/2024 .   Clinical Assessment and Goals of Care: 53 year old lady, well-known to palliative service, life-limiting illness of metastatic breast cancer with peritoneal carcinomatosis stage III chronic kidney disease, recently enrolled with hospice services, recent and numerous hospital admissions for intractable nausea vomiting. Patient was recently hospitalized for septic shock secondary to Klebsiella bacteremia and ongoing leak around G-tube site severe hypocalcemia deemed secondary to denosumab . Patient presented with nausea vomiting for several days and increased discharge around G-tube Patient admitted again for hypocalcemia and intractable nausea vomiting Palliative consult for symptom management goals of care discussions as well as for additional support Chart reviewed Patient seen and examined Palliative medicine is specialized medical care for people living with serious illness. It focuses on providing relief from the symptoms and stress of a serious illness. The goal is to improve quality of life for both the patient and the family. Goals of care: Broad aims of medical therapy in relation to the patient's values and preferences. Our aim is to provide medical care aimed at enabling patients to achieve the goals that matter most to them, given the circumstances of their particular medical situation and their constraints.  Goals wishes and values attempted to be explored.  Discussed with patient about pain and symptom management of nausea vomiting-see  below  NEXT OF KIN    SUMMARY OF RECOMMENDATIONS    1. Full Code for now - extensive conversations regarding consideration for DNR DNI have been done by both PMT as well as TRH in recent hospitalization, continue such code status discussions.  2. Agree with Home Hospice. [facility] involved recently.  3. Symptom management of pain, nausea vomiting:    Agree with resuming TDF, add IV Dilaudid  PRN for acute/breakthrough pain.   Add low dose PO Dexamethasone  taper, add low dose at bedtime Zyprexa   PMT to follow.   Code Status/Advance Care Planning: Full code   Symptom Management:     Palliative Prophylaxis:  Frequent Pain Assessment    Psycho-social/Spiritual:  Desire for further Chaplaincy support:yes Additional Recommendations: Education on Hospice  Prognosis:  < 6 months  Discharge Planning: Home with Hospice      Primary Diagnoses: Present on Admission:  Pancytopenia (HCC)  Intractable nausea and vomiting  Hypocalcemia  N&V (nausea and vomiting)  Breast cancer (HCC)  Protein-calorie malnutrition, moderate   I have reviewed the medical record, interviewed the patient and family, and examined the patient. The following aspects are pertinent.  Past Medical History:  Diagnosis Date   Allergy    seasonal allergies   Anxiety    on meds   Asthma    uses inhaler   Breast cancer (HCC) 2012   RIGHT lumpectomy  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 Lynwood Nasuti   Hyperlipidemia     on meds   Hypertension    on meds   Peritoneal carcinomatosis (HCC)    S/P percutaneous endoscopic gastrostomy (PEG) tube placement (HCC)    SVT (supraventricular tachycardia)    Social History   Socioeconomic History   Marital status: Single    Spouse name: Not on file   Number of children: Not on file   Years of education: Not on file   Highest education level: Not on file  Occupational History   Not on file  Tobacco Use   Smoking status: Never   Smokeless tobacco: Never  Vaping Use   Vaping status: Never Used  Substance and Sexual Activity   Alcohol use: Not Currently    Alcohol/week: 7.0 standard drinks of alcohol    Types: 7 Standard drinks or equivalent per week    Comment: occassional   Drug use: No   Sexual activity: Not Currently  Other Topics Concern   Not on file  Social History Narrative   Not on file   Social Drivers of Health   Financial Resource Strain: High Risk (02/13/2024)   Overall Financial Resource Strain (CARDIA)    Difficulty of Paying Living Expenses: Hard  Food Insecurity: No Food Insecurity (04/29/2024)   Hunger Vital Sign    Worried About Running Out of Food in the Last Year: Never true    Ran Out of Food in the Last Year: Never true  Recent Concern: Food Insecurity - Food Insecurity Present (02/16/2024)   Hunger Vital Sign    Worried About Running Out of Food in the Last Year: Sometimes true    Ran Out of Food in the Last Year: Sometimes true  Transportation Needs: No Transportation Needs (04/29/2024)   PRAPARE - Administrator, Civil Service (Medical): No    Lack of Transportation (Non-Medical): No  Physical Activity: Inactive (02/13/2024)   Exercise Vital Sign    Days of Exercise per Week: 0 days    Minutes of Exercise per Session: 0 min  Stress: No Stress Concern Present (02/13/2024)   [employer] of Occupational Health - Occupational Stress Questionnaire    Feeling of Stress: Not at all  Social Connections:  Moderately Isolated (04/01/2024)   Social Connection and Isolation Panel    Frequency of Communication with Friends and Family: More than three times a week    Frequency of Social Gatherings with Friends and Family: Once a week    Attends Religious Services: 1 to 4 times per year    Active Member of [employer] or Organizations: No    Attends Banker Meetings: Never    Marital Status: Never married   Family History  Problem Relation Age of Onset   Breast cancer Mother        47s   Hypertension Father    Uterine cancer Maternal Aunt  Ovarian cancer Maternal Aunt    Breast cancer Maternal Aunt        69s   Breast cancer Cousin 45   Uterine cancer Cousin 50   Colon polyps Neg Hx    Colon cancer Neg Hx    Esophageal cancer Neg Hx    Stomach cancer Neg Hx    Scheduled Meds:  calcitRIOL   0.25 mcg Oral BID   calcium  carbonate  200 mg of elemental calcium  Oral TID   Chlorhexidine  Gluconate Cloth  6 each Topical Daily   dexamethasone   2 mg Oral Q6H   Followed by   NOREEN ON 05/03/2024] dexamethasone   2 mg Oral Q8H   enoxaparin  (LOVENOX ) injection  40 mg Subcutaneous Q24H   fentaNYL   1 patch Transdermal Q72H   OLANZapine  zydis  5 mg Oral QHS   Continuous Infusions:  calcium  gluconate 930 mg of elemental calcium  in sodium chloride  0.9 % 1,000 mL infusion 1 mg/kg/hr of elemental calcium  (04/30/24 1034)   dextrose  5% lactated ringers  with KCl 20 mEq/L 100 mL/hr at 04/30/24 0007   potassium PHOSPHATE  IVPB (in mmol) 30 mmol (04/30/24 1151)   PRN Meds:.albuterol , HYDROmorphone  (DILAUDID ) injection, prochlorperazine , sodium chloride  flush Medications Prior to Admission:  Prior to Admission medications   Medication Sig Start Date End Date Taking? Authorizing Provider  albuterol  (VENTOLIN  HFA) 108 (90 Base) MCG/ACT inhaler Inhale 2 puffs into the lungs every 6 (six) hours as needed for wheezing or shortness of breath.   Yes [provider]  calcitRIOL  (ROCALTROL ) 0.25 MCG  capsule Take 1 capsule (0.25 mcg total) by mouth 2 (two) times daily. 03/30/24  Yes Causey, Morna Pickle, NP  fentaNYL  (DURAGESIC ) 75 MCG/HR Place 1 patch onto the skin every 3 (three) days. 04/20/24  Yes Lue Elsie BROCKS, MD  magnesium  oxide (MAG-OX) 400 (240 Mg) MG tablet Take 1 tablet (400 mg total) by mouth 2 (two) times daily. 03/28/24 06/26/24 Yes Austria, Eric J, DO  ondansetron  (ZOFRAN -ODT) 4 MG disintegrating tablet Dissolve 1 tablet by mouth every 4 hours as needed for nausea or vomiting 04/25/24  Yes   oxyCODONE  (ROXICODONE ) 15 MG immediate release tablet Take 1 tablet (15 mg total) by mouth every 4 (four) hours as needed for pain. 04/20/24  Yes   Calcium  Carb-Cholecalciferol  (CALCIUM  600+D) 600-10 MG-MCG TABS Take 1 tablet by mouth 3 (three) times daily with meals. Patient not taking: Reported on 04/29/2024 03/30/24   [name], NP  calcium  carbonate (OS-CAL - DOSED IN MG OF ELEMENTAL CALCIUM ) 1250 (500 Ca) MG tablet Take 5 tablets (6,250 mg total) by mouth 3 (three) times daily with meals. 04/19/24   Lue Elsie BROCKS, MD  ergocalciferol , VITAMIN D2, (DRISDOL ) 200 MCG/ML drops Take 1 mL (8,000 Units total) by mouth daily. 04/19/24   Lue Elsie BROCKS, MD  feeding supplement (ENSURE PLUS HIGH PROTEIN) LIQD Take 237 mLs by mouth 2 (two) times daily between meals. Patient not taking: Reported on 04/29/2024 04/19/24   Lue Elsie BROCKS, MD  hydrocortisone (CORTEF) 10 MG tablet Take 5 tablets by mouth 2 times daily for 7 days, THEN take 2 tablets twice daily for 7 days, THEN take 1 tablet twice daily for 7 days, THEN take 1 tablet once daily for 7 days. 04/19/24 05/17/24  Lue Elsie BROCKS, MD  methadone (DOLOPHINE) 5 MG tablet Take 1 tablet by mouth three times a day 04/24/24     Multiple Vitamin (MULTIVITAMIN WITH MINERALS) TABS tablet Take 1 tablet by mouth daily. 04/19/24  Lue Elsie BROCKS, MD  Nystatin (GERHARDT'S BUTT CREAM) CREA Apply topically 3 (three)  times daily. 04/19/24   Lue Elsie BROCKS, MD  OLANZapine  zydis (ZYPREXA ) 5 MG disintegrating tablet Take 1 tablet (5 mg total) by mouth at bedtime. 04/19/24   Lue Elsie BROCKS, MD  omeprazole  (PRILOSEC) 40 MG capsule Take 1 capsule (40 mg total) by mouth daily. 04/19/24   Lue Elsie BROCKS, MD  phosphorus (K PHOS  NEUTRAL) 155-852-130 MG tablet Take 1 tablet (250 mg total) by mouth 3 (three) times daily. 04/19/24   Lue Elsie BROCKS, MD  potassium chloride  (KLOR-CON ) 20 MEQ packet Take 40 mEq by mouth daily. 04/19/24   Lue Elsie BROCKS, MD  sorbitol  70 % SOLN Take 30 mLs by mouth daily at 6 (six) AM. 04/20/24   Lue Elsie BROCKS, MD   Allergies  Allergen Reactions   Enhertu  [Fam-Trastuzumab  Deruxtec-Nxki] Other (See Comments)    Headache and chest pain. See progress note from 02/16/2024. Patient able to complete infusion.   Xgeva  [Denosumab ] Other (See Comments)    Severe prolonged hypocalcemia needing hospitalization   Review of Systems +nausea +abd pain from PEG tube site  Physical Exam G tube with leakage, mild to moderate generalized abd pain and discomfort  Regular work of breathing Awake alert Mood and affect within normal limits No edema  Vital Signs: BP 110/71 (BP Location: Right Arm)   Pulse 80   Temp 98.3 F (36.8 C) (Oral)   Resp 20   Ht 5' 11 (1.803 m)   Wt 68.2 kg   SpO2 100%   BMI 20.97 kg/m  Pain Scale: 0-10   Pain Score: 7    SpO2: SpO2: 100 % O2 Device:SpO2: 100 % O2 Flow Rate: .   IO: Intake/output summary:  Intake/Output Summary (Last 24 hours) at 04/30/2024 1440 Last data filed at 04/30/2024 1357 Gross per 24 hour  Intake 3194.77 ml  Output 1050 ml  Net 2144.77 ml    LBM: Last BM Date : 04/29/24 Baseline Weight: Weight: 72.6 kg Most recent weight: Weight: 68.2 kg     Palliative Assessment/Data:   PPS 50%  Time In:  1300 Time Out:  1415 Time Total:  75 Greater than 50%  of this time was spent counseling and  coordinating care related to the above assessment and plan.  Signed by: Lonia Serve, MD   Please contact Palliative Medicine Team phone at [phone] for questions and concerns.  For individual provider: See Tracey

## 2024-04-30 NOTE — TOC Initial Note (Signed)
 Transition of Care [facility]) - Initial/Assessment Note   Patient Details  Name: Chelsea Hansen MRN: [id number] Date of Birth: 12-30-1970  Transition of Care [facility]) CM/SW Contact:    Duwaine GORMAN Aran, LCSW Phone Number: 04/30/2024, 11:27 AM  Clinical Narrative: Patient is from home with family. Patient is currently active with [facility] hospice. Care management following for possible discharge needs.  Expected Discharge Plan: Home w Hospice Care Barriers to Discharge: Continued Medical Work up  Patient Goals and CMS Choice Patient states their goals for this hospitalization and ongoing recovery are:: [facility] hospice  Expected Discharge Plan and Services In-house Referral: Clinical Social Work Post Acute Care Choice: Hospice Living arrangements for the past 2 months: Apartment            DME Arranged: N/A DME Agency: NA  Prior Living Arrangements/Services Living arrangements for the past 2 months: Apartment Lives with:: Adult Children Patient language and need for interpreter reviewed:: Yes Do you feel safe going back to the place where you live?: Yes      Need for Family Participation in Patient Care: No (Comment) Care giver support system in place?: Yes (comment) Current home services: DME (Cane, rollator) Criminal Activity/Legal Involvement Pertinent to Current Situation/Hospitalization: No - Comment as needed  Activities of Daily Living ADL Screening (condition at time of admission) Independently performs ADLs?: Yes (appropriate for developmental age) Is the patient deaf or have difficulty hearing?: No Does the patient have difficulty seeing, even when wearing glasses/contacts?: No Does the patient have difficulty concentrating, remembering, or making decisions?: No  Emotional Assessment Orientation: : Oriented to Self, Oriented to Place, Oriented to  Time, Oriented to Situation Alcohol / Substance Use: Not Applicable Psych Involvement: No (comment)  Admission  diagnosis:  Hypocalcemia [E83.51] Cellulitis [L03.90] Sepsis, due to unspecified organism, unspecified whether acute organ dysfunction present [facility]) [A41.9] Patient Active Problem List   Diagnosis Date Noted   Sepsis (HCC) 04/30/2024   Pressure injury of skin 04/30/2024   Cellulitis 04/29/2024   Cancer associated pain 04/12/2024   Medication management 04/11/2024   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   Need for emotional support 04/10/2024   Bacteremia due to Klebsiella pneumoniae 04/10/2024   DNR (do not resuscitate) discussion 04/10/2024   Counseling and coordination of care 04/10/2024   Goals of care, counseling/discussion 04/10/2024   Palliative care encounter 04/10/2024   Septic shock (HCC) 04/09/2024   Malnutrition of moderate degree 04/04/2024   Fall 04/01/2024   Neonatal cows' milk hypocalcemia 03/20/2024   Vomiting and diarrhea 03/19/2024   H/O bypass gastrojejunostomy 03/08/2024   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   Common bile duct dilation 03/07/2024   Biliary obstruction (HCC) 03/07/2024   Abnormal LFTs 03/07/2024   Chronic anemia 03/07/2024   Gastrostomy tube present (HCC) 02/10/2024   Irritant contact dermatitis associated with digestive stoma 02/10/2024   Protein-calorie malnutrition, severe 02/03/2024   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 02/01/2024   Generalized abdominal pain 01/17/2024   Elevated CA 19-9 level 01/16/2024   Hypophosphatemia 01/04/2024   Hypomagnesemia 01/04/2024   Pancreatic lesion 12/29/2023   Unintentional weight loss 12/29/2023   Nausea and vomiting 12/29/2023   Duodenal obstruction 12/29/2023   Abnormal MRI of abdomen 12/26/2023   Abnormal CT of the abdomen 12/23/2023   Elevated liver enzymes 12/23/2023   Acute kidney injury 12/23/2023   AKI (acute kidney injury) 12/22/2023   Elevated LFTs 12/22/2023  Pancreatic mass 12/22/2023   Right sided weakness 12/22/2023   Hypotension 12/22/2023   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   Abnormal CT scan, colon 12/14/2023   Acute esophagitis 12/14/2023   Candida esophagitis (HCC) 12/14/2023   Therapeutic opioid-induced constipation (OIC) 12/11/2023   Hypocalcemia 12/10/2023   Fever of unknown origin 12/10/2023   Sinus tachycardia 12/10/2023   Acute kidney injury superimposed on chronic kidney disease 12/05/2023   Electrolyte abnormality 12/05/2023   Dehydration 12/05/2023   QT prolongation 12/05/2023   Intractable nausea and vomiting 11/24/2023   Neoplasm related pain 12/07/2022   Constipation 06/30/2022   Nausea and vomiting in adult 06/29/2022   Hematemesis 06/28/2022   Breast cancer metastasized to bone, right (HCC) 05/20/2022   Supraventricular tachycardia 07/08/2021   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   GERD (gastroesophageal reflux disease) 05/27/2021   HLD (hyperlipidemia) 05/27/2021   Essential hypertension 05/27/2021   Pain management contract signed 05/04/2021   Chronic pain syndrome (breast cancer) 05/04/2021   Palpitations 03/25/2021   Sinus bradycardia 03/25/2021   Daytime somnolence 03/25/2021   Snoring 03/25/2021   Obesity (BMI 30-39.9) 03/25/2021   Port-A-Cath in place 01/27/2021   Genetic testing 12/24/2020   Family history of uterine cancer 12/17/2020   Malignant neoplasm of upper-outer quadrant of right breast in female, estrogen receptor positive (HCC) 12/12/2020   Breast cancer (HCC) 12/12/2020   Hypokalemia 01/02/2012   Chronic asthma 01/01/2012   Bradycardia 01/01/2012   Syncope 12/31/2011   Mediastinal adenopathy 12/31/2011   Pancytopenia (HCC) 12/31/2011   Chest pain 12/31/2011   PCP:  [name], NP Pharmacy:   [facility] - [facility] [address] [place] [address] Phone: [phone] Fax: [phone]  [name] [address] [place] [address] Phone: [phone] Fax: [phone]  Social Drivers of Health (SDOH) Social History: SDOH Screenings   Food Insecurity: No Food Insecurity (04/29/2024)  Recent Concern: Food Insecurity - Food Insecurity Present (02/16/2024)  Housing: Low Risk  (04/29/2024)  Transportation Needs: No Transportation Needs (04/29/2024)  Utilities: Not At Risk (04/29/2024)  Depression (PHQ2-9): Medium Risk (03/30/2024)  Financial Resource Strain: High Risk (02/13/2024)  Physical Activity: Inactive (02/13/2024)  Social Connections: Moderately Isolated (04/01/2024)  Stress: No Stress Concern Present (02/13/2024)  Tobacco Use: Low Risk  (04/29/2024)  Health Literacy: Adequate Health Literacy (02/13/2024)   SDOH Interventions:    Readmission Risk Interventions    04/30/2024   11:25 AM 03/20/2024    3:17 PM 03/05/2024    4:03 PM  Readmission Risk Prevention Plan  Transportation Screening Complete  Complete  Medication Review Oceanographer) Complete Complete Complete  PCP or Specialist appointment within 3-5 days of discharge  Complete Complete  HRI or Home Care Consult Complete Complete Complete  SW Recovery Care/Counseling Consult Complete Complete Complete  Palliative Care Screening Not Applicable Not Applicable Not Applicable  Skilled Nursing Facility Not Applicable Not Applicable Not Applicable

## 2024-04-30 NOTE — Progress Notes (Addendum)
  Progress Note   Patient: Chelsea Hansen FMW:[id number] DOB: February 07, 1971 DOA: 04/29/2024     1 DOS: the patient was seen and examined on 04/30/2024   Brief hospital course: 53 year old woman PMH metastatic breast cancer, widespread abdominal metastasis, peritoneum, small intestine, status post duodenal obstruction, status post G-tube placement, recent admission for septic shock secondary to Klebsiella bacteremia, ongoing leak around G-tube site, severe hypocalcemia secondary to denosumab , presented 11/9 with nausea, vomiting for several days and increased discharge around the G-tube.  Reports tolerating oral feeds prior to vomiting, has not been using G-tube.  Admitted for hypocalcemia, intractable nausea and vomiting.  Consideration given to sepsis but no apparent source.  Consultants GI Interventional radiology Palliative medicine  Procedures/Events 11/9 admission  Assessment and Plan: Severe hypocalcemia Thought secondary to recent denosumab .  Seen on last hospitalization by nephrology. Repletion.  Check CMP in a.m.  Intractable nausea and vomiting Resolved.  Patient would like diet.  Etiology unclear.  CT abdomen pelvis unrevealing except for stool in colon.  Concern for sepsis with recent admission for Klebsiella bacteremia No evidence of sepsis at this time.  Blood cultures pending.  Stop antibiotics and monitor.  Metastatic breast cancer widespread abdominal metastases, peritoneum, small bowel, status post duodenal obstruction Left lower quadrant enterocutaneous fistula Status post PEG tube Chronic pain of malignancy Unfortunately no further treatment options  Pancytopenia CBC daily.  Transfuse as indicated.  Previously seen by palliative care, will involve for this hospitalization as well  Moderate protein calorie malnutrition  Diffuse colonic diverticulosis without evidence of  diverticulitis.  Large amount of stool in the rectosigmoid colon. Bowel  regimen     Subjective:  No n/v today Would like to eat Would like PEG removed  Physical Exam: Vitals:   04/30/24 0258 04/30/24 0428 04/30/24 1020 04/30/24 1313  BP: 94/63 92/64 109/73 110/71  Pulse: 85 80 80 80  Resp: 17  18 20   Temp: 98.2 F (36.8 C)  98.2 F (36.8 C) 98.3 F (36.8 C)  TempSrc: Oral  Oral Oral  SpO2: 95%  100% 100%  Weight:      Height:       Physical Exam Vitals reviewed.  Constitutional:      General: She is not in acute distress.    Appearance: She is not ill-appearing or toxic-appearing.  Cardiovascular:     Rate and Rhythm: Normal rate and regular rhythm.     Heart sounds: No murmur heard. Pulmonary:     Effort: Pulmonary effort is normal. No respiratory distress.     Breath sounds: No wheezing, rhonchi or rales.  Abdominal:     Comments: Leakage around G-tube noted  Neurological:     Mental Status: She is alert.  Psychiatric:        Mood and Affect: Mood normal.        Behavior: Behavior normal.     Data Reviewed: CBG stable Basic metabolic panel notable for uncorrected calcium  of 5.4 Phosphorus 1.6 Magnesium  1.4 Hemoglobin 7.3 Platelets 119 WBC 3.1 CT showed mild right-sided hydroureteronephrosis without obstruction, large amount of stool in colon, diffuse sclerotic osseous metastatic disease  Family Communication: none   Disposition: Status is: Inpatient Remains inpatient appropriate because: hypocalcemia     Time spent: 35 minutes  Author: Toribio Door, MD 04/30/2024 1:48 PM  For on call review [email or web address].

## 2024-04-30 NOTE — Hospital Course (Addendum)
 53 year old woman PMH metastatic breast cancer, widespread abdominal metastasis, peritoneum, small intestine, status post duodenal obstruction, status post G-tube placement, recent admission for septic shock secondary to Klebsiella bacteremia, ongoing leak around G-tube site, severe hypocalcemia secondary to denosumab , presented 11/9 with nausea, vomiting for several days and increased discharge around the G-tube.  Reports tolerating oral feeds prior to vomiting, has not been using G-tube.  Admitted for hypocalcemia, intractable nausea and vomiting.  Consideration given to sepsis but no apparent source.  Consultants GI Interventional radiology Palliative medicine  Procedures/Events 11/9 admission

## 2024-05-01 DIAGNOSIS — C50919 Malignant neoplasm of unspecified site of unspecified female breast: Secondary | ICD-10-CM | POA: Diagnosis not present

## 2024-05-01 DIAGNOSIS — E440 Moderate protein-calorie malnutrition: Secondary | ICD-10-CM

## 2024-05-01 DIAGNOSIS — R112 Nausea with vomiting, unspecified: Secondary | ICD-10-CM | POA: Diagnosis not present

## 2024-05-01 DIAGNOSIS — D61818 Other pancytopenia: Secondary | ICD-10-CM | POA: Diagnosis not present

## 2024-05-01 LAB — COMPREHENSIVE METABOLIC PANEL WITH GFR
ALT: 12 U/L (ref 0–44)
AST: 11 U/L — ABNORMAL LOW (ref 15–41)
Albumin: 3.1 g/dL — ABNORMAL LOW (ref 3.5–5.0)
Alkaline Phosphatase: 205 U/L — ABNORMAL HIGH (ref 38–126)
Anion gap: 7 (ref 5–15)
BUN: <5 mg/dL — ABNORMAL LOW (ref 6–20)
CO2: 24 mmol/L (ref 22–32)
Calcium: 7.4 mg/dL — ABNORMAL LOW (ref 8.9–10.3)
Chloride: 106 mmol/L (ref 98–111)
Creatinine, Ser: <0.3 mg/dL — ABNORMAL LOW (ref 0.44–1.00)
Glucose, Bld: 112 mg/dL — ABNORMAL HIGH (ref 70–99)
Potassium: 4.7 mmol/L (ref 3.5–5.1)
Sodium: 137 mmol/L (ref 135–145)
Total Bilirubin: 0.4 mg/dL (ref 0.0–1.2)
Total Protein: 5.7 g/dL — ABNORMAL LOW (ref 6.5–8.1)

## 2024-05-01 LAB — BASIC METABOLIC PANEL WITH GFR
Anion gap: 9 (ref 5–15)
BUN: <5 mg/dL — ABNORMAL LOW (ref 6–20)
CO2: 23 mmol/L (ref 22–32)
Calcium: 8.9 mg/dL (ref 8.9–10.3)
Chloride: 103 mmol/L (ref 98–111)
Creatinine, Ser: 0.36 mg/dL — ABNORMAL LOW (ref 0.44–1.00)
GFR, Estimated: >60 mL/min (ref >60)
Glucose, Bld: 109 mg/dL — ABNORMAL HIGH (ref 70–99)
Potassium: 4.4 mmol/L (ref 3.5–5.1)
Sodium: 135 mmol/L (ref 135–145)

## 2024-05-01 MED ORDER — GERHARDT'S BUTT CREAM
TOPICAL_CREAM | Freq: Every day | CUTANEOUS | Status: DC
  Administered 2024-05-03: 1 via TOPICAL
  Filled 2024-05-01: qty 60

## 2024-05-01 NOTE — Progress Notes (Signed)
 Daily Progress Note   Patient Name: Chelsea Hansen       Date: 05/01/2024 DOB: [birth year]  Age: 53 y.o. MRN#: [id number] Attending Physician: Jadine Toribio SQUIBB, MD Primary Care Physician: [name], NP Admit Date: 04/29/2024  Reason for Consultation/Follow-up: Establishing goals of care and Non pain symptom management  Subjective: Awake alert, mom at bedside, patient with worsening abd pain, increased leakage around PEG site.  Mother has a lot of questions about source of infection and plan of care.   Length of Stay: 2  Current Medications: Scheduled Meds:   calcitRIOL   0.25 mcg Oral BID   calcium  carbonate  200 mg of elemental calcium  Oral TID   Chlorhexidine  Gluconate Cloth  6 each Topical Daily   dexamethasone   2 mg Oral Q6H   Followed by   NOREEN ON 05/03/2024] dexamethasone   2 mg Oral Q8H   enoxaparin  (LOVENOX ) injection  40 mg Subcutaneous Q24H   fentaNYL   1 patch Transdermal Q72H   OLANZapine  zydis  5 mg Oral QHS   scopolamine   1 patch Transdermal Q72H    Continuous Infusions:  calcium  gluconate 930 mg of elemental calcium  in sodium chloride  0.9 % 1,000 mL infusion 1 mg/kg/hr of elemental calcium  (05/01/24 0653)    PRN Meds: albuterol , HYDROmorphone  (DILAUDID ) injection, prochlorperazine , sodium chloride  flush  Physical Exam          Vital Signs: BP 101/73 (BP Location: Left Arm)   Pulse 71   Temp (!) 97.4 F (36.3 C) (Oral)   Resp 18   Ht 5' 11 (1.803 m)   Wt 68.2 kg   SpO2 100%   BMI 20.97 kg/m  SpO2: SpO2: 100 % O2 Device: O2 Device: Room Air O2 Flow Rate:    Intake/output summary:  Intake/Output Summary (Last 24 hours) at 05/01/2024 1300 Last data filed at 05/01/2024 0653 Gross per 24 hour  Intake 727.9 ml  Output 500 ml  Net 227.9 ml    LBM: Last BM Date : 05/01/24 Baseline Weight: Weight: 72.6 kg Most recent weight: Weight: 68.2 kg       Palliative Assessment/Data:      Patient Active Problem List   Diagnosis Date Noted   Sepsis (HCC) 04/30/2024   Pressure injury of skin 04/30/2024   Cellulitis 04/29/2024   Cancer associated pain 04/12/2024  Medication management 04/11/2024   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   Need for emotional support 04/10/2024   Bacteremia due to Klebsiella pneumoniae 04/10/2024   DNR (do not resuscitate) discussion 04/10/2024   Counseling and coordination of care 04/10/2024   Goals of care, counseling/discussion 04/10/2024   Palliative care encounter 04/10/2024   Septic shock (HCC) 04/09/2024   Malnutrition of moderate degree 04/04/2024   Fall 04/01/2024   Neonatal cows' milk hypocalcemia 03/20/2024   Vomiting and diarrhea 03/19/2024   H/O bypass gastrojejunostomy 03/08/2024   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   Common bile duct dilation 03/07/2024   Biliary obstruction (HCC) 03/07/2024   Abnormal LFTs 03/07/2024   Chronic anemia 03/07/2024   Gastrostomy tube present (HCC) 02/10/2024   Irritant contact dermatitis associated with digestive stoma 02/10/2024   Protein-calorie malnutrition, moderate 02/03/2024   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 02/01/2024   Generalized abdominal pain 01/17/2024   Elevated CA 19-9 level 01/16/2024   Hypophosphatemia 01/04/2024   Hypomagnesemia 01/04/2024   Pancreatic lesion 12/29/2023   Unintentional weight loss 12/29/2023   N&V (nausea and vomiting) 12/29/2023   Duodenal obstruction 12/29/2023   Abnormal MRI of abdomen 12/26/2023   Abnormal CT of the abdomen 12/23/2023   Elevated liver enzymes 12/23/2023   Acute kidney injury 12/23/2023   AKI (acute kidney injury) 12/22/2023   Elevated LFTs 12/22/2023    Pancreatic mass 12/22/2023   Right sided weakness 12/22/2023   Hypotension 12/22/2023   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   Abnormal CT scan, colon 12/14/2023   Acute esophagitis 12/14/2023   Candida esophagitis (HCC) 12/14/2023   Therapeutic opioid-induced constipation (OIC) 12/11/2023   Hypocalcemia 12/10/2023   Fever of unknown origin 12/10/2023   Sinus tachycardia 12/10/2023   Acute kidney injury superimposed on chronic kidney disease 12/05/2023   Electrolyte abnormality 12/05/2023   Dehydration 12/05/2023   QT prolongation 12/05/2023   Intractable nausea and vomiting 11/24/2023   Neoplasm related pain 12/07/2022   Constipation 06/30/2022   Nausea and vomiting in adult 06/29/2022   Hematemesis 06/28/2022   Breast cancer metastasized to bone, right (HCC) 05/20/2022   Supraventricular tachycardia 07/08/2021   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   GERD (gastroesophageal reflux disease) 05/27/2021   HLD (hyperlipidemia) 05/27/2021   Essential hypertension 05/27/2021   Pain management contract signed 05/04/2021   Chronic pain syndrome (breast cancer) 05/04/2021   Palpitations 03/25/2021   Sinus bradycardia 03/25/2021   Daytime somnolence 03/25/2021   Snoring 03/25/2021   Obesity (BMI 30-39.9) 03/25/2021   Port-A-Cath in place 01/27/2021   Genetic testing 12/24/2020   Family history of uterine cancer 12/17/2020   Malignant neoplasm of upper-outer quadrant of right breast in female, estrogen receptor positive (HCC) 12/12/2020   Breast cancer (HCC) 12/12/2020   Hypokalemia 01/02/2012   Chronic asthma 01/01/2012   Bradycardia 01/01/2012   Syncope 12/31/2011   Mediastinal adenopathy 12/31/2011   Pancytopenia (HCC) 12/31/2011   Chest pain 12/31/2011    Palliative Care Assessment & Plan   Patient Profile:    Assessment:  Chelsea Hansen is a 53 y.o. female with metastatic breast cancer with several recent hospitalizations due to progressive vs. Additional disease.  She  was recently admitted in July 2025 for abdominal pain, nausea, vomiting due to duodenal stricture and SBO.  Also readmitted in October with Klebsiella bacteremia, sepsis, and poorly controlled G-tube leakage. She is readmitted to [facility] for ongoing leakage,  nausea, vomiting.  Ongoing abdominal pain Nausea and vomiting Recently enrolled in home hospice services  Recommendations/Plan:  PMT continues to follow for symptom management and GOC: Discussed with patient and mother in detail, earlier - on 11-10: inter disciplinary discussions held with IR and TRH. Talked with patient and mother about pain and non-pain symptom management of nausea and vomiting in detail. Additionally, discussed with patient about IR recommendations - put forth the recommendations regarding taking a short break from eating solid foods to rest her stomach, letting the tube drain by suction/gravity to relieve pressure and using special creams and dressings to protect skin. Discussed with patient and mom about holding off removal of the G tube for now due to risk of worsening fistula and skin damage. Goals of care discussions also held, explained to patient and mom about her recently being enrolled in hospice care and what hospice philosophy of care is all about. Patient's mother is accepting of home hospice care, for additional support in the home setting but their goals are not in line with comfort measures at this point in time. Both patient and mom would like to continue current mode of care, full code, and want to continue with hospitalizations for antibiotics and wound care if needed in the future. Explained to the best of my ability. Continue current opioids and nausea/vomiting regimen and monitor.   Goals of Care and Additional Recommendations: Limitations on Scope of Treatment: Full Scope Treatment  Code Status:    Code Status Orders  (From admission, onward)           Start     Ordered   04/29/24 2043  Full code   Continuous       Question:  By:  Answer:  Consent: discussion documented in EHR   04/29/24 2043           Code Status History     Date Active Date Inactive Code Status Order ID Comments User Context   04/01/2024 2221 04/19/2024 2128 Full Code [id number]  Alfornia Madison, MD ED   03/19/2024 2001 03/28/2024 2204 Full Code [id number]  Charlton Evalene RAMAN, MD Inpatient   03/05/2024 0620 03/09/2024 2305 Full Code [id number]  Franky Redia SAILOR, MD ED   02/28/2024 0046 02/28/2024 0735 Full Code [id number]  Charlton Evalene RAMAN, MD ED   02/01/2024 1707 02/06/2024 2331 Full Code [id number]  Royal Sill, MD Inpatient   12/22/2023 1914 01/17/2024 2318 Full Code [id number]  Silvester Ales, MD ED   12/10/2023 2305 12/14/2023 2036 Full Code [id number]  Lee Kingfisher, MD ED   12/05/2023 0454 12/07/2023 1846 Full Code [id number]  Keturah Carrier, MD ED   11/24/2023 1635 11/26/2023 2136 Full Code [id number]  Lou Claretta HERO, MD ED   07/06/2023 1327 07/07/2023 0503 Full Code [id number]  Hughes Simmonds, MD HOV   06/28/2022 2326 06/30/2022 1958 Full Code [id number]  Lonzell Emeline HERO, DO ED   12/31/2011 0902 01/02/2012 1255 Full Code [id number]  Tarpley, Briant Houston, RN Inpatient      Advance Directive Documentation    Flowsheet Row Most Recent Value  Type of Advance Directive Healthcare Power of Attorney  Pre-existing out of facility DNR order (yellow form or pink MOST form) --  MOST Form in Place? --    Prognosis:  Unable to determine  Discharge Planning: To Be Determined  Care plan was discussed with  patient, mother and another family member at bedside RN at bedside   Thank you for allowing the Palliative Medicine Team to assist  in the care of this patient.  High MDM     Greater than 50%  of this time was spent counseling and coordinating care related to the above assessment and plan.  Lonia Serve, MD  Please contact Palliative Medicine Team phone at [phone] for questions and concerns.

## 2024-05-01 NOTE — Progress Notes (Signed)
 [facility] Progress Note  CC:   Intractable nausea and vomiting   Subjective:  She is tolerating clear liquids with any nausea.  Mother and another family member at bedside.  Objective:  Vital signs in last 24 hours: Temp:  [97.4 F (36.3 C)-98.6 F (37 C)] 97.4 F (36.3 C) (11/11 0452) Pulse Rate:  [71-87] 71 (11/11 0452) Resp:  [18-20] 18 (11/11 0452) BP: (101-118)/(71-84) 101/73 (11/11 0452) SpO2:  [100 %] 100 % (11/11 0452) Last BM Date : 05/01/24 General:  Alert, Well-developed, in NAD Heart:  Regular rate and rhythm; no murmurs Pulm:  CTAB.  No W/R/R. Abdomen:  Soft, non-distended.  BS present.  G-tube still leaking significantly.  Intake/Output from previous day: 11/10 0701 - 11/11 0700 In: 737.9 [P.O.:260; I.V.:477.9] Out: 1550 [Urine:1000; Drains:150]  Lab Results: Recent Labs    04/29/24 1520 04/29/24 1532 04/29/24 2302 04/30/24 0848  WBC 3.2*  --  4.2 3.1*  HGB 9.1* 9.9* 8.7* 7.3*  HCT 28.8* 29.0* 28.0* 22.9*  PLT 169  --  136* 119*   BMET Recent Labs    04/30/24 0840 04/30/24 2005 05/01/24 0414  NA 138 135 137  K 3.6 3.9 4.7  CL 106 102 106  CO2 24 23 24   GLUCOSE 104* 111* 112*  BUN <5* <5* <5*  CREATININE 0.37* 0.37* <0.30*  CALCIUM  5.4* 6.7* 7.4*   LFT Recent Labs    05/01/24 0414  PROT 5.7*  ALBUMIN  3.1*  AST 11*  ALT 12  ALKPHOS 205*  BILITOT 0.4   CT ABDOMEN PELVIS W CONTRAST Result Date: 04/29/2024 EXAM: CT ABDOMEN AND PELVIS WITH CONTRAST 04/29/2024 05:42:44 PM TECHNIQUE: CT of the abdomen and pelvis was performed with the administration of 100 mL of iohexol  (OMNIPAQUE ) 300 MG/ML solution. Multiplanar reformatted images are provided for review. Automated exposure control, iterative reconstruction, and/or weight-based adjustment of the mA/kV was utilized to reduce the radiation dose to as low as reasonably achievable. COMPARISON: Chest abdomen and pelvis 04/09/2024 CLINICAL HISTORY: Sepsis; ab pain, pururlent  drainage from G tube. Patient has hx of omental mets. FINDINGS: LOWER CHEST: There is no patchy atelectasis/airspace disease in the lingula. LIVER: Small cysts are present measuring up to 12 mm. GALLBLADDER AND BILE DUCTS: Gallbladder is unremarkable. No biliary ductal dilatation. SPLEEN: No acute abnormality. PANCREAS: No acute abnormality. ADRENAL GLANDS: No acute abnormality. KIDNEYS, URETERS AND BLADDER: There is mild right-sided hydroureteronephrosis without obstructing calculus identified. No stones in the kidneys or ureters. No perinephric or periureteral stranding. Urinary bladder is unremarkable. GI AND BOWEL: Percutaneous gastrostomy tube is in the body of the stomach. There is no fluid collection or significant inflammation surrounding the PEG tube. There is some mild gastric wall thickening at the PEG tube insertion site. There is diffuse colonic diverticulosis. There are scattered colonic diverticula. There is a large amount of stool in the rectosigmoid colon. The appendix appears normal. There is no bowel obstruction. PERITONEUM AND RETROPERITONEUM: No ascites. No free air. No discrete peritoneal nodules are identified. VASCULATURE: Aorta is normal in caliber. LYMPH NODES: No lymphadenopathy. REPRODUCTIVE ORGANS: No acute abnormality. BONES AND SOFT TISSUES: Diffuse sclerotic osseous metastatic disease again noted. There is stable chronic compression deformity of L3. No acute osseous abnormality. No focal soft tissue abnormality. IMPRESSION: 1. Mild right-sided hydroureteronephrosis without obstructing calculus identified. 2. Diffuse colonic diverticulosis without evidence of diverticulitis. Large amount of stool in the rectosigmoid colon. 3. Percutaneous gastrostomy tube in appropriate position without surrounding fluid collection; mild gastric wall  thickening at the insertion site, likely reactive. 4. Diffuse sclerotic osseous metastatic disease again noted; stable chronic compression deformity of  L3. Electronically signed by: Greig Pique MD 04/29/2024 06:10 PM EST RP Workstation: [id number]   DG Chest Port 1 View Result Date: 04/29/2024 EXAM: 1 VIEW(S) XRAY OF THE CHEST 04/29/2024 03:34:00 PM COMPARISON: Chest x-ray 02/21/2024. CLINICAL HISTORY: fever, vomiting FINDINGS: LINES, TUBES AND DEVICES: Right chest port catheter tip in the distal SVC, unchanged. LUNGS AND PLEURA: No focal pulmonary opacity. No pulmonary edema. No pleural effusion. No pneumothorax. HEART AND MEDIASTINUM: No acute abnormality of the cardiac and mediastinal silhouettes. BONES AND SOFT TISSUES: No acute osseous abnormality. IMPRESSION: 1. No acute process. 2. Right chest port catheter tip in the distal SVC, unchanged. 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 CTAP here without acute intra-abdominal/pelvic pathology to explain her symptoms, no evidence of gastric outlet obstruction/small bowel obstruction, etc.   - During hospitalization last month symptoms thought to be multifactorial due to carcinomatosis, gastroduodenostomy and hypocalcemia. - Defer endoscopic recommendations to Dr. Suzann, but patient wants to eat so I will allow her clear liquid today.  Not sure that EGD will necessarily make a difference in management. - It appears that just last month palliative care was helping to manage her nausea and vomiting and she had improvement on Decadron , Zyprexa , and as needed Compazine .  She seemed to respond to this fairly quickly, but it appears that she is no longer on the Zyprexa  or the Decadron .  Looks like they have been consulted again. - Has scopolamine  patch in place, decadron  and zyprexa  have been  re-initiated.  Also has compazine  prn.   Severe hypocalcemia:  Likely related to recent use of desunomab and vitamin D  deficiency as noted by nephrologist during last admission last month.  Ca is 7.4 today. -Treatment per hospitalist.   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  C. difficile and GI pathogen panel negative.  Currently denies diarrhea. - Palliative care previously considering Octreotide   Anemia of chronic disease, Hgb currently 7.3 grams but no sign of GI bleeding. 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  They have replaced the bumper and tightened it.  Made some other recommendations including intermittent bowel rest or at least on a liquid diet for several days.  **No new recs from a GI standpoint.  Patient's mother wants to talk to IR so I have spoken with them and they will send the PA up to discuss shortly.  GI is going to sign off.    LOS: 2 days   Harlene BIRCH. Akhil Piscopo  05/01/2024, 12:35 PM

## 2024-05-01 NOTE — Progress Notes (Signed)
  Progress Note   Patient: Chelsea Hansen FMW:[id number] DOB: 08-30-1970 DOA: 04/29/2024     2 DOS: the patient was seen and examined on 05/01/2024   Brief hospital course: 53 year old woman PMH metastatic breast cancer, widespread abdominal metastasis, peritoneum, small intestine, status post duodenal obstruction, status post G-tube placement, recent admission for septic shock secondary to Klebsiella bacteremia, ongoing leak around G-tube site, severe hypocalcemia secondary to denosumab , presented 11/9 with nausea, vomiting for several days and increased discharge around the G-tube.  Reports tolerating oral feeds prior to vomiting, has not been using G-tube.  Admitted for hypocalcemia, intractable nausea and vomiting.  Consideration given to sepsis but no apparent source.  Consultants GI Interventional radiology Palliative medicine  Procedures/Events 11/9 admission  Assessment and Plan: Severe hypocalcemia Thought secondary to recent denosumab .  Seen on last hospitalization by nephrology. Continue repletion.  Slowly improving.   Intractable nausea and vomiting CT abdomen pelvis unrevealing except for stool in colon.  Discussed with NP oncology, thought secondary to hypocalcemia.  Leaking PEG tube site Substantial challenge over time.  Seen by interventional radiology, gastroenterology, appreciate their assessment and recommendations.  At this point patient elects to try brief break from eating solid foods, hook drain to suction, utilize special creams to protect the skin.  Removing the PEG tube has not been recommended given the concern for worsening fistula and skin damage.   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   Moderate protein calorie  malnutrition   Diffuse colonic diverticulosis without evidence of  diverticulitis.  Large amount of stool in the rectosigmoid colon. Bowel regimen   Concern for sepsis with recent admission for Klebsiella bacteremia No evidence of sepsis at this time.  Blood cultures pending.  Antibiotics discontinued 11/10.    Subjective:  Feels okay today, does not understand why tube cannot come out.  Physical Exam: Vitals:   04/30/24 1313 04/30/24 2148 05/01/24 0452 05/01/24 1418  BP: 110/71 118/84 101/73 136/88  Pulse: 80 87 71 94  Resp: 20 18 18 20   Temp: 98.3 F (36.8 C) 98.6 F (37 C) (!) 97.4 F (36.3 C) 97.8 F (36.6 C)  TempSrc: Oral Oral Oral Oral  SpO2: 100% 100% 100% 100%  Weight:      Height:       Physical Exam Vitals reviewed.  Constitutional:      General: She is not in acute distress.    Appearance: She is ill-appearing (chronically). She is not toxic-appearing.  Cardiovascular:     Rate and Rhythm: Normal rate and regular rhythm.     Heart sounds: No murmur heard. Pulmonary:     Effort: Pulmonary effort is normal. No respiratory distress.     Breath sounds: No wheezing, rhonchi or rales.  Neurological:     Mental Status: She is alert.  Psychiatric:        Mood and Affect: Mood normal.        Behavior: Behavior normal.     Data Reviewed: Corrected calcium  up to 8.1  Family Communication: Mother and father at bedside  Disposition: Status is: Inpatient Remains inpatient appropriate because: Hypocalcemia     Time spent: 20 minutes  Author: Toribio Door, MD 05/01/2024 2:46 PM  For on call review [email or web address].

## 2024-05-01 NOTE — Progress Notes (Signed)
 Chelsea Hansen [facility] Liaison note   [name] is a current hospice patient we follow at home, for terminal dx breast cancer of the nipple & areola. Chelsea Hansen called our agency stating increased pain and vomiting- a nurse was dispatched to her home for a visit.  Chelsea Hansen called our agency back stating she was unable to wait any longer because of her pain.  Patient's daughter was taking her to [facility] ED.  Chelsea Bunker, RN/Triage with [facility]- called report to the ED. Per Dr. Norleen Hansen with [facility], this is a related hospital admission. Patient is a full code.   Visited with patient and mother and father at bedside. Patient alert and verbally responsive however her mother was asking most of the questions. Patient concerned that because she decided to come in the hospital that she needed to stop her hospice services. Thoroughly explained hospice philosophy of care and services provided and assured her that she had the right to come into the hospital. However also discussed that with the progressive nature of her disease it was very likely that these interventions will be short lived. Both patient and her family verbalized understanding and stated that at this time they would like to continue with hospice services.  Patient remains inpatient appropriate with the need for IV electrolyte replacement, IV antibiotics as well as they are hopeful for intervention for her G-tube.    Vital Signs- 97.4/71/18   101/73  I&O- 488/200  Abnormal Labs- BUN <5, Creatinine <0.30, Ca+ 7.4, Alk Phos 205, Albumin  3.1  Diagnostics-  None New  IV/PRN Meds- Calcium  Gluconate 930mg /1LNS 2 73.41ml/H continuous, D5LR @ 100ml/H continuous, Dilaudid  1mg  q3H prn x8   Current plan as per PN Dr. Toribio Hansen, 11.11.2025 Severe hypocalcemia Thought secondary to recent denosumab .  Seen on last hospitalization by nephrology. Continue repletion.  Slowly improving.   Intractable  nausea and vomiting CT abdomen pelvis unrevealing except for stool in colon.  Discussed with NP oncology, thought secondary to hypocalcemia.   Leaking PEG tube site Substantial challenge over time.  Seen by interventional radiology, gastroenterology, appreciate their assessment and recommendations.  At this point patient elects to try brief break from eating solid foods, hook drain to suction, utilize special creams to protect the skin.  Removing the PEG tube has not been recommended given the concern for worsening fistula and skin damage.   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   Moderate protein calorie malnutrition   Diffuse colonic diverticulosis without evidence of  diverticulitis.  Large amount of stool in the rectosigmoid colon. Bowel regimen   Concern for sepsis with recent admission for Klebsiella bacteremia No evidence of sepsis at this time.  Blood cultures pending.  Antibiotics discontinued 11/10.    Discharge Planning- Ongoing, likely home once medically optimized Family Contact- talked with patient and parents at bedside  IDT: updated  Goals of Care: Full Code with ongoing discussions Transfer summary and Med list sent to be placed under Media tab.  Thank you for the opportunity to participate in this patient's care, please don't hesitate to call for any hospice related questions or concerns.     Chelsea Hansen, BSN, [employer] hospital liaison  [phone]

## 2024-05-02 LAB — COMPREHENSIVE METABOLIC PANEL WITH GFR
ALT: 10 U/L (ref 0–44)
AST: 10 U/L — ABNORMAL LOW (ref 15–41)
Albumin: 3.3 g/dL — ABNORMAL LOW (ref 3.5–5.0)
Alkaline Phosphatase: 215 U/L — ABNORMAL HIGH (ref 38–126)
Anion gap: 7 (ref 5–15)
BUN: <5 mg/dL — ABNORMAL LOW (ref 6–20)
CO2: 25 mmol/L (ref 22–32)
Calcium: 8.5 mg/dL — ABNORMAL LOW (ref 8.9–10.3)
Chloride: 104 mmol/L (ref 98–111)
Creatinine, Ser: 0.34 mg/dL — ABNORMAL LOW (ref 0.44–1.00)
GFR, Estimated: >60 mL/min (ref >60)
Glucose, Bld: 130 mg/dL — ABNORMAL HIGH (ref 70–99)
Potassium: 4.5 mmol/L (ref 3.5–5.1)
Sodium: 135 mmol/L (ref 135–145)
Total Bilirubin: 0.4 mg/dL (ref 0.0–1.2)
Total Protein: 5.9 g/dL — ABNORMAL LOW (ref 6.5–8.1)

## 2024-05-02 LAB — CBC
HCT: 24.5 % — ABNORMAL LOW (ref 36.0–46.0)
Hemoglobin: 7.5 g/dL — ABNORMAL LOW (ref 12.0–15.0)
MCH: 26.6 pg (ref 26.0–34.0)
MCHC: 30.6 g/dL (ref 30.0–36.0)
MCV: 86.9 fL (ref 80.0–100.0)
Platelets: 169 K/uL (ref 150–400)
RBC: 2.82 MIL/uL — ABNORMAL LOW (ref 3.87–5.11)
RDW: 18 % — ABNORMAL HIGH (ref 11.5–15.5)
WBC: 3.8 K/uL — ABNORMAL LOW (ref 4.0–10.5)
nRBC: 0 % (ref 0.0–0.2)

## 2024-05-02 LAB — MAGNESIUM: Magnesium: 1.8 mg/dL (ref 1.7–2.4)

## 2024-05-02 LAB — PHOSPHORUS: Phosphorus: 2.3 mg/dL — ABNORMAL LOW (ref 2.5–4.6)

## 2024-05-02 LAB — CALCIUM: Calcium: 8.8 mg/dL — ABNORMAL LOW (ref 8.9–10.3)

## 2024-05-02 MED ORDER — HYDROMORPHONE HCL 1 MG/ML IJ SOLN
1.0000 mg | INTRAMUSCULAR | Status: DC | PRN
  Administered 2024-05-02 – 2024-05-03 (×5): 2 mg via INTRAVENOUS
  Administered 2024-05-03 (×3): 1 mg via INTRAVENOUS
  Administered 2024-05-03 – 2024-05-04 (×5): 2 mg via INTRAVENOUS
  Administered 2024-05-04 (×2): 1 mg via INTRAVENOUS
  Filled 2024-05-02 (×2): qty 2
  Filled 2024-05-02: qty 1
  Filled 2024-05-02 (×2): qty 2
  Filled 2024-05-02 (×2): qty 1
  Filled 2024-05-02 (×3): qty 2
  Filled 2024-05-02: qty 1
  Filled 2024-05-02 (×3): qty 2
  Filled 2024-05-02 (×2): qty 1

## 2024-05-02 NOTE — Progress Notes (Signed)
 PROGRESS NOTE    Chelsea Hansen  FMW:[id number] DOB: 11-Jan-1971 DOA: 04/29/2024 PCP: [name], NP   Brief Narrative:  53 year old woman with past medical history of metastatic breast cancer, widespread abdominal metastasis to peritoneum, small intestine, status post duodenal obstruction, status post G-tube placement, recent admission for septic shock secondary to Klebsiella bacteremia, ongoing leak around G-tube site, severe hypocalcemia secondary to denosumab  presented with nausea, vomiting and increased discharge around the G-tube.  Patient apparently has not been using tube.  Admitted for hypocalcemia, intractable nausea and vomiting.  GI/palliative care/IR were consulted.  Assessment & Plan:   Severe hypocalcemia - Possibly due to recent denosumab .  Continue repletion.  Improving.  Monitor  Intractable nausea and vomiting -CT abdomen and pelvis was unrevealing except for stool in colon.  Possibly due to hypocalcemia as well. -GI signed off on 05/01/2024.  Continue PPI twice a day.  Continue scopolamine  patch along with Zyprexa  and Decadron  - Currently on clear liquid diet: Patient requesting regular diet.  Will advance diet as tolerated.  Leaking G tube site -Substantial challenge over time. Seen by interventional radiology, gastroenterology, appreciate their assessment and recommendations.  -Removing the G tube has not been recommended given the concern for worsening fistula and skin damage.  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  Patient remains full code  although also enrolled with home hospice.  Palliative care following.  Prognosis is very poor  Moderate protein calorie malnutrition  Diffuse colonic diverticulosis without evidence of diverticulitis Large amount of stool in the rectosigmoid colon - Continue bowel regimen  Sepsis has been ruled out -Antibiotics discontinued already  Thrombocytopenia -Resolved  Anemia of chronic disease - From cancer.  Hemoglobin stable.  Monitor intermittently  Leukopenia - Mild.  Monitor intermittently  Physical deconditioning - PT eval   DVT prophylaxis: Lovenox  Code Status: Full Family Communication: None at bedside Disposition Plan: Status is: Inpatient Remains inpatient appropriate because: Of severity of illness    Consultants: GI/palliative care/IR  Procedures: None  Antimicrobials:  Anti-infectives (From admission, onward)    Start     Dose/Rate Route Frequency Ordered Stop   04/30/24 0500  vancomycin  (VANCOCIN ) IVPB 1000 mg/200 mL premix  Status:  Discontinued        1,000 mg 200 mL/hr over 60 Minutes Intravenous Every 12 hours 04/29/24 2116 04/30/24 1357   04/29/24 2200  ceFEPIme  (MAXIPIME ) 2 g in sodium chloride  0.9 % 100 mL IVPB  Status:  Discontinued        2 g 200 mL/hr over 30 Minutes Intravenous Every 8 hours 04/29/24 2116 04/30/24 1357   04/29/24 1545  ceFEPIme  (MAXIPIME ) 2 g in sodium chloride  0.9 % 100 mL IVPB        2 g 200 mL/hr over 30 Minutes Intravenous  Once 04/29/24 1535 04/29/24 1617   04/29/24 1530  vancomycin  (VANCOCIN ) IVPB 1000 mg/200 mL premix        1,000 mg 200 mL/hr over 60 Minutes Intravenous  Once 04/29/24 1526 04/29/24 1721        Subjective: Patient seen and examined at bedside.  Feels slightly better.  Requesting regular diet.  No fever, seizures  or agitation reported.  Objective: Vitals:   05/01/24 0452 05/01/24 1418 05/01/24 2043 05/02/24 0455  BP: 101/73 136/88 130/85 106/74  Pulse: 71 94 66 72  Resp: 18 20 17 16   Temp: (!)  97.4 F (36.3 C) 97.8 F (36.6 C) 97.9 F (36.6 C) 97.9 F (36.6 C)  TempSrc: Oral Oral Oral Oral  SpO2: 100% 100% 100% 97%  Weight:      Height:        Intake/Output Summary (Last 24 hours) at 05/02/2024 1140 Last data filed at 05/02/2024 1000 Gross per 24 hour  Intake --  Output 200 ml  Net -200 ml   Filed Weights   04/29/24 1423 04/29/24 1441 04/29/24 2226  Weight: 72.6 kg 72.6 kg 68.2 kg    Examination:  General exam: Appears calm and comfortable.  Looks chronically ill and deconditioned Respiratory system: Bilateral decreased breath sounds at bases Cardiovascular system: S1 & S2 heard, Rate controlled Gastrointestinal system: Abdomen is distended, soft and mildly tender. Normal bowel sounds heard.  G-tube present Extremities: No cyanosis, clubbing, edema  Central nervous system: Alert and oriented.  Slow to respond.  Poor historian.  No focal neurological deficits. Moving extremities Skin: No rashes, lesions or ulcers Psychiatry: Flat affect.  Not agitated.  Data Reviewed: I have personally reviewed following labs and imaging studies  CBC: Recent Labs  Lab 04/29/24 1520 04/29/24 1532 04/29/24 2302 04/30/24 0848 05/02/24 0110  WBC 3.2*  --  4.2 3.1* 3.8*  NEUTROABS 2.2  --   --  2.2  --   HGB 9.1* 9.9* 8.7* 7.3* 7.5*  HCT 28.8* 29.0* 28.0* 22.9* 24.5*  MCV 85.5  --  86.4 86.7 86.9  PLT 169  --  136* 119* 169   Basic Metabolic Panel: Recent Labs  Lab 04/29/24 1520 04/29/24 1532 04/29/24 2301 04/29/24 2312 04/30/24 0840 04/30/24 0848 04/30/24 2005 05/01/24 0414 05/01/24 1751 05/02/24 0110  NA 135   < > 138  --  138  --  135 137 135 135  K 3.3*   < > 3.1*  --  3.6  --  3.9 4.7 4.4 4.5  CL 99   < > 105  --  106  --  102 106 103 104  CO2 23  --  21*  --  24  --  23 24 23 25   GLUCOSE 89   < > 108*  --  104*  --  111* 112* 109* 130*  BUN 7   < > <5*  --  <5*  --  <5* <5* <5* <5*  CREATININE 0.38*   < > 0.38*   < > 0.37*  --  0.37* <0.30* 0.36* 0.34*   CALCIUM  5.0*  --  5.2*  --  5.4*  --  6.7* 7.4* 8.9 8.5*  MG 1.7  --  1.5*  --   --  1.4*  --   --   --   --   PHOS  --   --   --   --   --  1.6*  --   --   --   --    < > = values in this interval not displayed.   GFR: Estimated Creatinine Clearance: 89.6 mL/min (A) (by C-G formula based on SCr of 0.34 mg/dL (L)). Liver Function Tests: Recent Labs  Lab 04/29/24 1520 05/01/24 0414 05/02/24 0110  AST 19 11* 10*  ALT 16 12 10   ALKPHOS 241* 205* 215*  BILITOT 0.7 0.4 0.4  PROT 6.8 5.7* 5.9*  ALBUMIN  3.5 3.1* 3.3*   Recent Labs  Lab 04/29/24 1520  LIPASE 16   No results for input(s): AMMONIA in the last 168 hours. Coagulation Profile: No results for input(s): INR, PROTIME in the last 168 hours. Cardiac Enzymes: No results for input(s): CKTOTAL, CKMB, CKMBINDEX, TROPONINI in the last 168 hours. BNP (last 3 results) No results for input(s): PROBNP in the last 8760 hours. HbA1C: No results for input(s): HGBA1C in the last 72 hours. CBG: No results for input(s): GLUCAP in the last 168 hours. Lipid Profile: No results for input(s): CHOL, HDL, LDLCALC, TRIG, CHOLHDL, LDLDIRECT in the last 72 hours. Thyroid  Function Tests: No results for input(s): TSH, T4TOTAL, FREET4, T3FREE, THYROIDAB in the last 72 hours. Anemia Panel: No results for input(s): VITAMINB12, FOLATE, FERRITIN, TIBC, IRON, RETICCTPCT in the last 72 hours. Sepsis Labs: Recent Labs  Lab 04/29/24 1531  LATICACIDVEN 0.4*    Recent Results (from the past 240 hours)  Blood culture (routine x 2)     Status: None (Preliminary result)   Collection Time: 04/29/24  3:05 PM   Specimen: BLOOD  Result Value Ref Range Status   Specimen Description   Final    BLOOD BLOOD RIGHT HAND Performed at [name], [address], [place], [place] [address]    Special Requests   Final    BOTTLES DRAWN AEROBIC ONLY Blood Culture results may not be  optimal due to an inadequate volume of blood received in culture bottles Performed at [facility], [address] [place], [place] [address]    Culture   Final    NO GROWTH 3 DAYS Performed at [facility], [address], [place], [place] [address]    Report Status PENDING  Incomplete  Blood culture (routine x 2)     Status: None (Preliminary result)   Collection Time: 04/29/24  3:20 PM   Specimen: BLOOD  Result Value Ref Range Status   Specimen Description   Final    BLOOD CHEST RIGHT PORTA CATH Performed at [facility], [address], [place], [place] [address]    Special Requests   Final    BOTTLES DRAWN AEROBIC AND ANAEROBIC Blood Culture adequate volume Performed at [facility], [address] [place], [place] [address]    Culture   Final    NO GROWTH 3 DAYS Performed at [name], [address] [place], [place] [address]    Report Status PENDING  Incomplete         Radiology Studies: No results found.      Scheduled Meds:  calcitRIOL   0.25 mcg Oral BID   calcium  carbonate  200 mg of elemental calcium  Oral TID   Chlorhexidine  Gluconate Cloth  6 each Topical Daily   dexamethasone   2 mg Oral Q6H   Followed by   NOREEN ON 05/03/2024] dexamethasone   2 mg Oral Q8H   enoxaparin  (LOVENOX ) injection  40 mg Subcutaneous Q24H   fentaNYL   1 patch Transdermal Q72H   Gerhardt's butt cream   Topical Daily   OLANZapine  zydis  5 mg Oral QHS   scopolamine   1 patch Transdermal Q72H   Continuous Infusions:  calcium  gluconate 930 mg of elemental calcium  in sodium chloride  0.9 % 1,000 mL infusion 0.5 mg/kg/hr of elemental calcium  (05/01/24 1931)          Sophie Mao, MD [facility] 05/02/2024, 11:40 AM

## 2024-05-02 NOTE — TOC Initial Note (Signed)
 Transition of Care [facility]) - Initial/Assessment Note    Patient Details  Name: Chelsea Hansen MRN: [id number] Date of Birth: 1971/01/15  Transition of Care [facility]) CM/SW Contact:    Bascom Service, RN Phone Number: 05/02/2024, 11:55 AM  Clinical Narrative: Active w/[facility] rep [name] following for return home-confirmed address. Noted GT in place-issues w/leaking. Per [name](sister)Family to transport home.                  Expected Discharge Plan: Home w Hospice Care Barriers to Discharge: Continued Medical Work up   Patient Goals and CMS Choice Patient states their goals for this hospitalization and ongoing recovery are:: return home w/hospice acitve w/[facility] CMS Medicare.gov Compare Post Acute Care list provided to:: Patient Represenative (must comment) ([name](dtr)) Choice offered to / list presented to : Sibling [place] ownership interest in [facility].provided to:: Sibling    Expected Discharge Plan and Services In-house Referral: Clinical Social Work Discharge Planning Services: CM Consult Post Acute Care Choice: Hospice Living arrangements for the past 2 months: Apartment                 DME Arranged: N/A DME Agency: NA                  Prior Living Arrangements/Services Living arrangements for the past 2 months: Apartment Lives with:: Relatives, Siblings Patient language and need for interpreter reviewed:: Yes Do you feel safe going back to the place where you live?: Yes      Need for Family Participation in Patient Care: No (Comment) Care giver support system in place?: Yes (comment) Current home services: DME (Per [facility] has all dme needed.) Criminal Activity/Legal Involvement Pertinent to Current Situation/Hospitalization: No - Comment as needed  Activities of Daily Living   ADL Screening (condition at time of admission) Independently performs ADLs?: Yes (appropriate for developmental age) Is the patient deaf or have  difficulty hearing?: No Does the patient have difficulty seeing, even when wearing glasses/contacts?: No Does the patient have difficulty concentrating, remembering, or making decisions?: No  Permission Sought/Granted                  Emotional Assessment       Orientation: : Oriented to Self, Oriented to Place, Oriented to  Time, Oriented to Situation Alcohol / Substance Use: Not Applicable Psych Involvement: No (comment)  Admission diagnosis:  Hypocalcemia [E83.51] Cellulitis [L03.90] Sepsis, due to unspecified organism, unspecified whether acute organ dysfunction present [facility]) [A41.9] Patient Active Problem List   Diagnosis Date Noted   Sepsis (HCC) 04/30/2024   Pressure injury of skin 04/30/2024   Cellulitis 04/29/2024   Cancer associated pain 04/12/2024   Medication management 04/11/2024   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   Need for emotional support 04/10/2024   Bacteremia due to Klebsiella pneumoniae 04/10/2024   DNR (do not resuscitate) discussion 04/10/2024   Counseling and coordination of care 04/10/2024   Goals of care, counseling/discussion 04/10/2024   Palliative care encounter 04/10/2024   Septic shock (HCC) 04/09/2024   Malnutrition of moderate degree 04/04/2024   Fall 04/01/2024   Neonatal cows' milk hypocalcemia 03/20/2024   Vomiting and diarrhea 03/19/2024   H/O bypass gastrojejunostomy 03/08/2024   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   Common bile duct dilation 03/07/2024   Biliary obstruction (HCC) 03/07/2024   Abnormal LFTs 03/07/2024   Chronic anemia 03/07/2024   Gastrostomy tube present (HCC) 02/10/2024  Irritant contact dermatitis associated with digestive stoma 02/10/2024   Protein-calorie malnutrition, moderate 02/03/2024   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 02/01/2024   Generalized abdominal pain 01/17/2024    Elevated CA 19-9 level 01/16/2024   Hypophosphatemia 01/04/2024   Hypomagnesemia 01/04/2024   Pancreatic lesion 12/29/2023   Unintentional weight loss 12/29/2023   N&V (nausea and vomiting) 12/29/2023   Duodenal obstruction 12/29/2023   Abnormal MRI of abdomen 12/26/2023   Abnormal CT of the abdomen 12/23/2023   Elevated liver enzymes 12/23/2023   Acute kidney injury 12/23/2023   AKI (acute kidney injury) 12/22/2023   Elevated LFTs 12/22/2023   Pancreatic mass 12/22/2023   Right sided weakness 12/22/2023   Hypotension 12/22/2023   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   Abnormal CT scan, colon 12/14/2023   Acute esophagitis 12/14/2023   Candida esophagitis (HCC) 12/14/2023   Therapeutic opioid-induced constipation (OIC) 12/11/2023   Hypocalcemia 12/10/2023   Fever of unknown origin 12/10/2023   Sinus tachycardia 12/10/2023   Acute kidney injury superimposed on chronic kidney disease 12/05/2023   Electrolyte abnormality 12/05/2023   Dehydration 12/05/2023   QT prolongation 12/05/2023   Intractable nausea and vomiting 11/24/2023   Neoplasm related pain 12/07/2022   Constipation 06/30/2022   Nausea and vomiting in adult 06/29/2022   Hematemesis 06/28/2022   Breast cancer metastasized to bone, right (HCC) 05/20/2022   Supraventricular tachycardia 07/08/2021   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   GERD (gastroesophageal reflux disease) 05/27/2021   HLD (hyperlipidemia) 05/27/2021   Essential hypertension 05/27/2021   Pain management contract signed 05/04/2021   Chronic pain syndrome (breast cancer) 05/04/2021   Palpitations 03/25/2021   Sinus bradycardia 03/25/2021   Daytime somnolence 03/25/2021   Snoring 03/25/2021   Obesity (BMI 30-39.9) 03/25/2021   Port-A-Cath in place 01/27/2021   Genetic testing 12/24/2020   Family history of uterine cancer 12/17/2020   Malignant neoplasm of upper-outer quadrant of right breast in female, estrogen receptor positive (HCC)  12/12/2020   Breast cancer (HCC) 12/12/2020   Hypokalemia 01/02/2012   Chronic asthma 01/01/2012   Bradycardia 01/01/2012   Syncope 12/31/2011   Mediastinal adenopathy 12/31/2011   Pancytopenia (HCC) 12/31/2011   Chest pain 12/31/2011   PCP:  [name], NP Pharmacy:   [facility] [address] [place] [address] Phone: [phone] Fax: [phone]  [name] [address] [place] [address] Phone: [phone] Fax: [phone]     Social Drivers of Health (SDOH) Social History: SDOH Screenings   Food Insecurity: No Food Insecurity (04/29/2024)  Recent Concern: Food Insecurity - Food Insecurity Present (02/16/2024)  Housing: Low Risk  (04/29/2024)  Transportation Needs: No Transportation Needs (04/29/2024)  Utilities: Not At Risk (04/29/2024)  Depression (PHQ2-9): Medium Risk (03/30/2024)  Financial Resource Strain: High Risk (02/13/2024)  Physical Activity: Inactive (02/13/2024)  Social Connections: Moderately Isolated (04/01/2024)  Stress: No Stress Concern Present (02/13/2024)  Tobacco Use: Low Risk  (04/29/2024)  Health Literacy: Adequate Health Literacy (02/13/2024)   SDOH Interventions:     Readmission Risk Interventions    04/30/2024   11:25 AM 03/20/2024    3:17 PM 03/05/2024    4:03 PM  Readmission Risk Prevention Plan  Transportation Screening Complete  Complete  Medication Review Oceanographer) Complete Complete Complete  PCP or Specialist appointment within 3-5 days of discharge  Complete Complete  HRI or Home Care Consult Complete Complete Complete  SW Recovery Care/Counseling Consult Complete  Complete Complete  Palliative Care Screening Not Applicable Not Applicable Not Applicable  Skilled Nursing Facility Not Applicable Not Applicable Not Applicable

## 2024-05-02 NOTE — Progress Notes (Addendum)
 Patient ID: Chelsea Hansen, female   DOB: [birth year], 53 y.o.   MRN: [id number] Patient seen again yesterday for leaking Gtube.  Upon arrival to room, the gtube bumper was noted to be very loose - this is known patient will loosen the bumper repeatedly due to pain at the site - this is likely primary reason for leaking. I stressed importance of not loosening the bumper, using barrier cream at site, and only 1 layer of gauze under the bumper to prevent leaking and skin breakdown. Patient additionally has known carcinomatosis and repeat bowel obstructions. Patient has been tolerating normal PO diet, but gtube also functioning for venting, which is why removal is not recommended. Gtube was placed to [facility] yesterday without significant output - I found the tube to be clogged with food from patient eating normal diet. Diet was switched to liquid diet yesterday and nursing reports suction output from tube improved significantly overnight due to solid food no longer being present to clog the tube. Patient will continue to have clogging of the gtube if used for suction, if she continues to eat regular solid diet. There was some granular tissue around insertion site - I used silver  nitrate yesterday to help with this healing.   There is no further intervention planned by IR. Patient is at largest size of gtube currently (66F) and no way to upsize to try and minimize leaking - patient needs to follow recs from above to prevent leaking, regardless of tube size. If suction is required, patient will need to follow a liquid diet to prevent clogging.  Please reach out to IR with any future questions or concerns.   Kimble DEL Chestine Belknap PA-C 05/02/2024 10:54 AM

## 2024-05-02 NOTE — Progress Notes (Signed)
 Daily Progress Note   Patient Name: Chelsea Hansen       Date: 05/02/2024 DOB: [birth year]  Age: 53 y.o. MRN#: [id number] Attending Physician: Cheryle Page, MD Primary Care Physician: [name], NP Admit Date: 04/29/2024  Reason for Consultation/Follow-up: Establishing goals of care and Non pain symptom management  Subjective: Awake alert, wound care at bedside, patient with worsening abd pain, increased leakage around PEG site.     Length of Stay: 3  Current Medications: Scheduled Meds:   calcitRIOL   0.25 mcg Oral BID   calcium  carbonate  200 mg of elemental calcium  Oral TID   Chlorhexidine  Gluconate Cloth  6 each Topical Daily   dexamethasone   2 mg Oral Q6H   Followed by   NOREEN ON 05/03/2024] dexamethasone   2 mg Oral Q8H   enoxaparin  (LOVENOX ) injection  40 mg Subcutaneous Q24H   fentaNYL   1 patch Transdermal Q72H   Gerhardt's butt cream   Topical Daily   OLANZapine  zydis  5 mg Oral QHS   scopolamine   1 patch Transdermal Q72H    Continuous Infusions:  calcium  gluconate 930 mg of elemental calcium  in sodium chloride  0.9 % 1,000 mL infusion 0.5 mg/kg/hr of elemental calcium  (05/01/24 1931)    PRN Meds: albuterol , HYDROmorphone  (DILAUDID ) injection, sodium chloride  flush  Physical Exam         PEG site noted Awake alert Mild distress due to abd pain No edema  Vital Signs: BP 106/74 (BP Location: Left Arm)   Pulse 72   Temp 97.9 F (36.6 C) (Oral)   Resp 16   Ht 5' 11 (1.803 m)   Wt 68.2 kg   SpO2 97%   BMI 20.97 kg/m  SpO2: SpO2: 97 % O2 Device: O2 Device: Room Air O2 Flow Rate:    Intake/output summary:  Intake/Output Summary (Last 24 hours) at 05/02/2024 1243 Last data filed at 05/02/2024 1000 Gross per 24 hour  Intake --  Output 200 ml   Net -200 ml   LBM: Last BM Date : 05/02/24 Baseline Weight: Weight: 72.6 kg Most recent weight: Weight: 68.2 kg       Palliative Assessment/Data:      Patient Active Problem List   Diagnosis Date Noted   Sepsis (HCC) 04/30/2024   Pressure injury of skin 04/30/2024   Cellulitis 04/29/2024  Cancer associated pain 04/12/2024   Medication management 04/11/2024   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   Need for emotional support 04/10/2024   Bacteremia due to Klebsiella pneumoniae 04/10/2024   DNR (do not resuscitate) discussion 04/10/2024   Counseling and coordination of care 04/10/2024   Goals of care, counseling/discussion 04/10/2024   Palliative care encounter 04/10/2024   Septic shock (HCC) 04/09/2024   Malnutrition of moderate degree 04/04/2024   Fall 04/01/2024   Neonatal cows' milk hypocalcemia 03/20/2024   Vomiting and diarrhea 03/19/2024   H/O bypass gastrojejunostomy 03/08/2024   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   Common bile duct dilation 03/07/2024   Biliary obstruction (HCC) 03/07/2024   Abnormal LFTs 03/07/2024   Chronic anemia 03/07/2024   Gastrostomy tube present (HCC) 02/10/2024   Irritant contact dermatitis associated with digestive stoma 02/10/2024   Protein-calorie malnutrition, moderate 02/03/2024   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 02/01/2024   Generalized abdominal pain 01/17/2024   Elevated CA 19-9 level 01/16/2024   Hypophosphatemia 01/04/2024   Hypomagnesemia 01/04/2024   Pancreatic lesion 12/29/2023   Unintentional weight loss 12/29/2023   N&V (nausea and vomiting) 12/29/2023   Duodenal obstruction 12/29/2023   Abnormal MRI of abdomen 12/26/2023   Abnormal CT of the abdomen 12/23/2023   Elevated liver enzymes 12/23/2023   Acute kidney injury 12/23/2023   AKI (acute kidney injury) 12/22/2023   Elevated LFTs  12/22/2023   Pancreatic mass 12/22/2023   Right sided weakness 12/22/2023   Hypotension 12/22/2023   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   Abnormal CT scan, colon 12/14/2023   Acute esophagitis 12/14/2023   Candida esophagitis (HCC) 12/14/2023   Therapeutic opioid-induced constipation (OIC) 12/11/2023   Hypocalcemia 12/10/2023   Fever of unknown origin 12/10/2023   Sinus tachycardia 12/10/2023   Acute kidney injury superimposed on chronic kidney disease 12/05/2023   Electrolyte abnormality 12/05/2023   Dehydration 12/05/2023   QT prolongation 12/05/2023   Intractable nausea and vomiting 11/24/2023   Neoplasm related pain 12/07/2022   Constipation 06/30/2022   Nausea and vomiting in adult 06/29/2022   Hematemesis 06/28/2022   Breast cancer metastasized to bone, right (HCC) 05/20/2022   Supraventricular tachycardia 07/08/2021   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   GERD (gastroesophageal reflux disease) 05/27/2021   HLD (hyperlipidemia) 05/27/2021   Essential hypertension 05/27/2021   Pain management contract signed 05/04/2021   Chronic pain syndrome (breast cancer) 05/04/2021   Palpitations 03/25/2021   Sinus bradycardia 03/25/2021   Daytime somnolence 03/25/2021   Snoring 03/25/2021   Obesity (BMI 30-39.9) 03/25/2021   Port-A-Cath in place 01/27/2021   Genetic testing 12/24/2020   Family history of uterine cancer 12/17/2020   Malignant neoplasm of upper-outer quadrant of right breast in female, estrogen receptor positive (HCC) 12/12/2020   Breast cancer (HCC) 12/12/2020   Hypokalemia 01/02/2012   Chronic asthma 01/01/2012   Bradycardia 01/01/2012   Syncope 12/31/2011   Mediastinal adenopathy 12/31/2011   Pancytopenia (HCC) 12/31/2011   Chest pain 12/31/2011    Palliative Care Assessment & Plan   Patient Profile:    Assessment:  Chelsea Hansen is a 53 y.o. female with metastatic breast cancer with several recent hospitalizations due to progressive vs. Additional  disease.  She was recently admitted in July 2025 for abdominal pain, nausea, vomiting due to duodenal stricture and SBO.  Also readmitted in October with Klebsiella bacteremia, sepsis, and poorly controlled G-tube leakage. She is  readmitted to [facility] for ongoing leakage, nausea, vomiting.  Ongoing abdominal pain Nausea and vomiting Recently enrolled in home hospice services  Recommendations/Plan: Full code for now Home with hospice when stable IR note reviewed Discussed with RN and TRH  Goals of Care and Additional Recommendations: Limitations on Scope of Treatment: Full Scope Treatment  Code Status:    Code Status Orders  (From admission, onward)           Start     Ordered   04/29/24 2043  Full code  Continuous       Question:  By:  Answer:  Consent: discussion documented in EHR   04/29/24 2043           Code Status History     Date Active Date Inactive Code Status Order ID Comments User Context   04/01/2024 2221 04/19/2024 2128 Full Code [id number]  Alfornia Madison, MD ED   03/19/2024 2001 03/28/2024 2204 Full Code [id number]  Charlton Evalene RAMAN, MD Inpatient   03/05/2024 0620 03/09/2024 2305 Full Code [id number]  Franky Redia SAILOR, MD ED   02/28/2024 0046 02/28/2024 0735 Full Code [id number]  Charlton Evalene RAMAN, MD ED   02/01/2024 1707 02/06/2024 2331 Full Code [id number]  Royal Sill, MD Inpatient   12/22/2023 1914 01/17/2024 2318 Full Code [id number]  Silvester Ales, MD ED   12/10/2023 2305 12/14/2023 2036 Full Code [id number]  Lee Kingfisher, MD ED   12/05/2023 0454 12/07/2023 1846 Full Code [id number]  Keturah Carrier, MD ED   11/24/2023 1635 11/26/2023 2136 Full Code [id number]  Lou Claretta HERO, MD ED   07/06/2023 1327 07/07/2023 0503 Full Code [id number]  Hughes Simmonds, MD HOV   06/28/2022 2326 06/30/2022 1958 Full Code [id number]  Lonzell Emeline HERO, DO ED   12/31/2011 0902 01/02/2012 1255 Full Code [id number]  Tarpley, Briant Houston, RN Inpatient      Advance Directive  Documentation    Flowsheet Row Most Recent Value  Type of Advance Directive Healthcare Power of Attorney  Pre-existing out of facility DNR order (yellow form or pink MOST form) --  MOST Form in Place? --    Prognosis:  Unable to determine  Discharge Planning: Home with hospice.   Care plan was discussed with  patient, TRH and RN    Thank you for allowing the Palliative Medicine Team to assist in the care of this patient.  High MDM     Greater than 50%  of this time was spent counseling and coordinating care related to the above assessment and plan.  Lonia Serve, MD  Please contact Palliative Medicine Team phone at [phone] for questions and concerns.

## 2024-05-02 NOTE — Progress Notes (Signed)
 S: Patient is admitted admitted with hypercalcemia and abdominal symptoms including leakage around the G-tube.  She has a history of metastatic breast cancer with pancreas and peritoneal carcinomatosis.  She has been referred for hospice care.     05/02/2024    1:35 PM 05/02/2024    4:55 AM 05/01/2024    8:43 PM  Vitals with BMI  Systolic  106 130  Diastolic  74 85  Pulse 72 72 66       Latest Ref Rng & Units 05/02/2024    1:10 AM 04/30/2024    8:48 AM 04/29/2024   11:02 PM  CBC  WBC 4.0 - 10.5 K/uL 3.8  3.1  4.2   Hemoglobin 12.0 - 15.0 g/dL 7.5  7.3  8.7   Hematocrit 36.0 - 46.0 % 24.5  22.9  28.0   Platelets 150 - 400 K/uL 169  119  136        Latest Ref Rng & Units 05/02/2024    1:10 AM 05/01/2024    5:51 PM 05/01/2024    4:14 AM  CMP  Glucose 70 - 99 mg/dL 869  890  887   BUN 6 - 20 mg/dL <5  <5  <5   Creatinine 0.44 - 1.00 mg/dL 9.65  9.63  <9.69   Sodium 135 - 145 mmol/L 135  135  137   Potassium 3.5 - 5.1 mmol/L 4.5  4.4  4.7   Chloride 98 - 111 mmol/L 104  103  106   CO2 22 - 32 mmol/L 25  23  24    Calcium  8.9 - 10.3 mg/dL 8.5  8.9  7.4   Total Protein 6.5 - 8.1 g/dL 5.9   5.7   Total Bilirubin 0.0 - 1.2 mg/dL 0.4   0.4   Alkaline Phos 38 - 126 U/L 215   205   AST 15 - 41 U/L 10   11   ALT 0 - 44 U/L 10   12   Assessment and plan 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  Patient wanted to discuss with me if there was any options for treatment.  I informed her that given her advanced age and performance status I do not recommend any additional treatment. I instructed her to continue with hospice care. Frequent severe hypercalcemia issues Issues around G-tube: Radiology is being managing.  We are available as needed.

## 2024-05-03 LAB — CBC WITH DIFFERENTIAL/PLATELET
Abs Immature Granulocytes: 0.27 K/uL — ABNORMAL HIGH (ref 0.00–0.07)
Basophils Absolute: 0 K/uL (ref 0.0–0.1)
Basophils Relative: 1 %
Eosinophils Absolute: 0 K/uL (ref 0.0–0.5)
Eosinophils Relative: 0 %
HCT: 22.8 % — ABNORMAL LOW (ref 36.0–46.0)
Hemoglobin: 7 g/dL — ABNORMAL LOW (ref 12.0–15.0)
Immature Granulocytes: 7 %
Lymphocytes Relative: 15 %
Lymphs Abs: 0.6 K/uL — ABNORMAL LOW (ref 0.7–4.0)
MCH: 27 pg (ref 26.0–34.0)
MCHC: 30.7 g/dL (ref 30.0–36.0)
MCV: 88 fL (ref 80.0–100.0)
Monocytes Absolute: 0.3 K/uL (ref 0.1–1.0)
Monocytes Relative: 8 %
Neutro Abs: 2.7 K/uL (ref 1.7–7.7)
Neutrophils Relative %: 69 %
Platelets: 173 K/uL (ref 150–400)
RBC: 2.59 MIL/uL — ABNORMAL LOW (ref 3.87–5.11)
RDW: 17.9 % — ABNORMAL HIGH (ref 11.5–15.5)
Smear Review: NORMAL
WBC: 3.8 K/uL — ABNORMAL LOW (ref 4.0–10.5)
nRBC: 0.5 % — ABNORMAL HIGH (ref 0.0–0.2)

## 2024-05-03 LAB — COMPREHENSIVE METABOLIC PANEL WITH GFR
ALT: 10 U/L (ref 0–44)
AST: 12 U/L — ABNORMAL LOW (ref 15–41)
Albumin: 3.1 g/dL — ABNORMAL LOW (ref 3.5–5.0)
Alkaline Phosphatase: 220 U/L — ABNORMAL HIGH (ref 38–126)
Anion gap: 7 (ref 5–15)
BUN: 6 mg/dL (ref 6–20)
CO2: 27 mmol/L (ref 22–32)
Calcium: 8.5 mg/dL — ABNORMAL LOW (ref 8.9–10.3)
Chloride: 104 mmol/L (ref 98–111)
Creatinine, Ser: 0.43 mg/dL — ABNORMAL LOW (ref 0.44–1.00)
GFR, Estimated: >60 mL/min (ref >60)
Glucose, Bld: 131 mg/dL — ABNORMAL HIGH (ref 70–99)
Potassium: 4.2 mmol/L (ref 3.5–5.1)
Sodium: 137 mmol/L (ref 135–145)
Total Bilirubin: 0.3 mg/dL (ref 0.0–1.2)
Total Protein: 5.5 g/dL — ABNORMAL LOW (ref 6.5–8.1)

## 2024-05-03 LAB — HEMOGLOBIN AND HEMATOCRIT, BLOOD
HCT: 26.7 % — ABNORMAL LOW (ref 36.0–46.0)
Hemoglobin: 8.6 g/dL — ABNORMAL LOW (ref 12.0–15.0)

## 2024-05-03 LAB — PREPARE RBC (CROSSMATCH)

## 2024-05-03 LAB — MAGNESIUM: Magnesium: 1.8 mg/dL (ref 1.7–2.4)

## 2024-05-03 MED ORDER — SODIUM CHLORIDE 0.9% IV SOLUTION: Freq: Once | INTRAVENOUS | Status: AC

## 2024-05-03 MED ORDER — SODIUM CHLORIDE 0.9 % IV BOLUS
500.0000 mL | Freq: Once | INTRAVENOUS | Status: AC
  Administered 2024-05-03: 500 mL via INTRAVENOUS

## 2024-05-03 MED ORDER — FENTANYL 100 MCG/HR TD PT72
1.0000 | MEDICATED_PATCH | TRANSDERMAL | Status: DC
Start: 2024-05-03 — End: 2024-05-04
  Administered 2024-05-03: 1 via TRANSDERMAL
  Filled 2024-05-03: qty 1

## 2024-05-03 MED ORDER — ONDANSETRON HCL 4 MG/2ML IJ SOLN
4.0000 mg | Freq: Four times a day (QID) | INTRAMUSCULAR | Status: DC | PRN
  Administered 2024-05-03: 4 mg via INTRAVENOUS
  Filled 2024-05-03: qty 2

## 2024-05-03 NOTE — Progress Notes (Signed)
 Pt feeling lightheaded, BP wnl, hgb/hct down from 7.5 to 7 from yesterday. MD Alekh notified, giving 500mL NS bolus and monitoring. MD to see pt and decide if blood needed.

## 2024-05-03 NOTE — Progress Notes (Addendum)
 Daily Progress Note   Patient Name: Chelsea Hansen       Date: 05/03/2024 DOB: 1970-12-30  Age: 53 y.o. MRN#: [id number] Attending Physician: Cheryle Page, MD Primary Care Physician: [name], NP Admit Date: 04/29/2024  Reason for Consultation/Follow-up: Establishing goals of care and Non pain symptom management  Subjective: Awake alert, wound care at bedside, patient with worsening abd pain, states that she might be getting PRBC today.    Length of Stay: 4  Current Medications: Scheduled Meds:  . calcitRIOL   0.25 mcg Oral BID  . calcium  carbonate  200 mg of elemental calcium  Oral TID  . Chlorhexidine  Gluconate Cloth  6 each Topical Daily  . dexamethasone   2 mg Oral Q6H   Followed by  . dexamethasone   2 mg Oral Q8H  . enoxaparin  (LOVENOX ) injection  40 mg Subcutaneous Q24H  . fentaNYL   1 patch Transdermal Q72H  . Gerhardt's butt cream   Topical Daily  . OLANZapine  zydis  5 mg Oral QHS  . scopolamine   1 patch Transdermal Q72H    Continuous Infusions:    PRN Meds: albuterol , HYDROmorphone  (DILAUDID ) injection, sodium chloride  flush  Physical Exam         PEG site noted Awake alert Mild to moderate distress due to abd pain No edema  Vital Signs: BP 108/75 (BP Location: Left Arm)   Pulse 64   Temp 98.4 F (36.9 C) (Oral)   Resp 16   Ht 5' 11 (1.803 m)   Wt 68.2 kg   SpO2 97%   BMI 20.97 kg/m  SpO2: SpO2: 97 % O2 Device: O2 Device: Room Air O2 Flow Rate:    Intake/output summary:  Intake/Output Summary (Last 24 hours) at 05/03/2024 1058 Last data filed at 05/03/2024 1000 Gross per 24 hour  Intake 1320 ml  Output --  Net 1320 ml   LBM: Last BM Date : 05/02/24 Baseline Weight: Weight: 72.6 kg Most recent weight: Weight: 68.2 kg        Palliative Assessment/Data:      Patient Active Problem List   Diagnosis Date Noted  . Sepsis (HCC) 04/30/2024  . Pressure injury of skin 04/30/2024  . Cellulitis 04/29/2024  . Cancer associated pain 04/12/2024  . Medication management 04/11/2024  . 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  Need for emotional support 04/10/2024  . Bacteremia due to Klebsiella pneumoniae 04/10/2024  . DNR (do not resuscitate) discussion 04/10/2024  . Counseling and coordination of care 04/10/2024  . Goals of care, counseling/discussion 04/10/2024  . Palliative care encounter 04/10/2024  . Septic shock (HCC) 04/09/2024  . Malnutrition of moderate degree 04/04/2024  . Fall 04/01/2024  . Neonatal cows' milk hypocalcemia 03/20/2024  . Vomiting and diarrhea 03/19/2024  . H/O bypass gastrojejunostomy 03/08/2024  . 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  . Common bile duct dilation 03/07/2024  . Biliary obstruction (HCC) 03/07/2024  . Abnormal LFTs 03/07/2024  . Chronic anemia 03/07/2024  . Gastrostomy tube present (HCC) 02/10/2024  . Irritant contact dermatitis associated with digestive stoma 02/10/2024  . Protein-calorie malnutrition, moderate 02/03/2024  . 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 Generalized abdominal pain 01/17/2024  . Elevated CA 19-9 level 01/16/2024  . Hypophosphatemia 01/04/2024  . Hypomagnesemia 01/04/2024  . Pancreatic lesion 12/29/2023  . Unintentional weight loss 12/29/2023  . N&V (nausea and vomiting) 12/29/2023  . Duodenal obstruction 12/29/2023  . Abnormal MRI of abdomen 12/26/2023  . Abnormal CT of the abdomen 12/23/2023  . Elevated liver enzymes 12/23/2023  . Acute kidney injury 12/23/2023  . AKI (acute kidney injury) 12/22/2023  . Elevated LFTs 12/22/2023  . Pancreatic mass 12/22/2023  . Right sided weakness 12/22/2023  .  Hypotension 12/22/2023  . 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  . Abnormal CT scan, colon 12/14/2023  . Acute esophagitis 12/14/2023  . Candida esophagitis (HCC) 12/14/2023  . Therapeutic opioid-induced constipation (OIC) 12/11/2023  . Hypocalcemia 12/10/2023  . Fever of unknown origin 12/10/2023  . Sinus tachycardia 12/10/2023  . Acute kidney injury superimposed on chronic kidney disease 12/05/2023  . Electrolyte abnormality 12/05/2023  . Dehydration 12/05/2023  . QT prolongation 12/05/2023  . Intractable nausea and vomiting 11/24/2023  . Neoplasm related pain 12/07/2022  . Constipation 06/30/2022  . Nausea and vomiting in adult 06/29/2022  . Hematemesis 06/28/2022  . Breast cancer metastasized to bone, right (HCC) 05/20/2022  . Supraventricular tachycardia 07/08/2021  . 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  . GERD (gastroesophageal reflux disease) 05/27/2021  . HLD (hyperlipidemia) 05/27/2021  . Essential hypertension 05/27/2021  . Pain management contract signed 05/04/2021  . Chronic pain syndrome (breast cancer) 05/04/2021  . Palpitations 03/25/2021  . Sinus bradycardia 03/25/2021  . Daytime somnolence 03/25/2021  . Snoring 03/25/2021  . Obesity (BMI 30-39.9) 03/25/2021  . Port-A-Cath in place 01/27/2021  . Genetic testing 12/24/2020  . Family history of uterine cancer 12/17/2020  . Malignant neoplasm of upper-outer quadrant of right breast in female, estrogen receptor positive (HCC) 12/12/2020  . Breast cancer (HCC) 12/12/2020  . Hypokalemia 01/02/2012  . Chronic asthma 01/01/2012  . Bradycardia 01/01/2012  . Syncope 12/31/2011  . Mediastinal adenopathy 12/31/2011  . Pancytopenia (HCC) 12/31/2011  . Chest pain 12/31/2011    Palliative Care Assessment & Plan   Patient Profile:    Assessment:  Chelsea Hansen is a 53 y.o. female with metastatic breast cancer with several recent hospitalizations due to progressive vs. Additional disease.  She was recently admitted  in July 2025 for abdominal pain, nausea, vomiting due to duodenal stricture and SBO.  Also readmitted in October with Klebsiella bacteremia, sepsis, and poorly controlled G-tube leakage. She is readmitted to [facility] for ongoing leakage, nausea, vomiting.  Ongoing abdominal pain Nausea and vomiting Recently enrolled in home hospice services  Recommendations/Plan: Full code for now - discussed again with patient about it being appropriate to consider DNR DNI, she asks for some more time and space for reflection for now.  Home with hospice when stable IR and oncology note reviewed Discussed with patient today about considering full comfort care, DNR DNI and talked about doing more of a comfort focused approach that might could include IV opioids at home through her port/home based PCA for comfort, provided she is on board with considering DNR DNI and comfort measures through home hospice. She will think about this, full code for now.  IV Dilaudid  PRN Increase TDF to 100 mcg Q 72 hours.   Goals of Care and Additional Recommendations: Limitations on Scope of Treatment: Full Scope Treatment  Code Status:    Code Status Orders  (From admission, onward)           Start     Ordered   04/29/24 2043  Full code  Continuous       Question:  By:  Answer:  Consent: discussion documented in EHR   04/29/24 2043           Code Status History     Date Active Date Inactive Code Status Order ID Comments User Context   04/01/2024 2221 04/19/2024 2128 Full Code [id number]  Alfornia Madison, MD ED   03/19/2024 2001 03/28/2024 2204 Full Code [id number]  Charlton Evalene RAMAN, MD Inpatient   03/05/2024 0620 03/09/2024 2305 Full Code [id number]  Franky Redia SAILOR, MD ED   02/28/2024 0046 02/28/2024 0735 Full Code [id number]  Charlton Evalene RAMAN, MD ED   02/01/2024 1707 02/06/2024 2331 Full Code [id number]  Royal Sill, MD Inpatient   12/22/2023 1914 01/17/2024 2318 Full Code [id number]  Silvester Ales, MD ED    12/10/2023 2305 12/14/2023 2036 Full Code [id number]  Lee Kingfisher, MD ED   12/05/2023 0454 12/07/2023 1846 Full Code [id number]  Keturah Carrier, MD ED   11/24/2023 1635 11/26/2023 2136 Full Code [id number]  Lou Claretta HERO, MD ED   07/06/2023 1327 07/07/2023 0503 Full Code [id number]  Hughes Simmonds, MD HOV   06/28/2022 2326 06/30/2022 1958 Full Code [id number]  Lonzell Emeline HERO, DO ED   12/31/2011 0902 01/02/2012 1255 Full Code [id number]  Tarpley, Briant Houston, RN Inpatient      Advance Directive Documentation    Flowsheet Row Most Recent Value  Type of Advance Directive Healthcare Power of Attorney  Pre-existing out of facility DNR order (yellow form or pink MOST form) --  MOST Form in Place? --    Prognosis:  Less than 6 months.   Discharge Planning: Home with hospice.   Care plan was discussed with  patient and RN    Thank you for allowing the Palliative Medicine Team to assist in the care of this patient.  High MDM     Greater than 50%  of this time was spent counseling and coordinating care related to the above assessment and plan.  Lonia Serve, MD  Please contact Palliative Medicine Team phone at [phone] for questions and concerns.

## 2024-05-03 NOTE — Progress Notes (Signed)
 Pt's PEG tube still on low intermittent suction with minimal output. Still a great amount of leaking around the site, especially more noted after pt's taking sips of a drink ( seems to come right out of the site). Frequent dressing changes implaented with teaching done with pt. Pt voiced understanding of hand hygiene, use of cleaning products and protective skin care products. Pt is eager to practice changes on her own

## 2024-05-03 NOTE — Evaluation (Addendum)
 Physical Therapy Evaluation Patient Details Name: Chelsea Hansen MRN: [id number] DOB: 04-16-1971 Today's Date: 05/03/2024  History of Present Illness  Patient is a 53 y/o female admitted 04/29/24 with pain, nausea and vomiting and increased discharge around G-tube.  PMH positive for metastatic breast cancer with peritoneal carcinomatosis stage III, CKD, DVT, asthma, SVT, recent hospitalization due to septic shock from Klebsiella bacteremia.  Clinical Impression  Patient presents with decreased mobility due to abdominal pain and generalized weakness.  Previously at home and with help for stairs and showering.  She was able to mobilize today with min A/HHA  for ambulation in hallway.  She was limited by pain and drainage from G-tube site.  Patient will benefit from skilled PT during this acute stay to allow return home with family support and Hospice services.       If plan is discharge home, recommend the following: A little help with walking and/or transfers;A little help with bathing/dressing/bathroom;Help with stairs or ramp for entrance;Assist for transportation   Can travel by private vehicle        Equipment Recommendations None recommended by PT  Recommendations for Other Services       Functional Status Assessment Patient has had a recent decline in their functional status and demonstrates the ability to make significant improvements in function in a reasonable and predictable amount of time.     Precautions / Restrictions Precautions Precautions: Fall Precaution/Restrictions Comments: G-tube with copious drainage around tube (today tube to intermittent suction)      Mobility  Bed Mobility Overal bed mobility: Needs Assistance Bed Mobility: Supine to Sit     Supine to sit: Min assist     General bed mobility comments: HHA using rail    Transfers Overall transfer level: Needs assistance Equipment used: 1 person hand held assist Transfers: Sit to/from Stand Sit to  Stand: Min assist           General transfer comment: assist for holding towel in place on her abdomen    Ambulation/Gait Ambulation/Gait assistance: Min assist Gait Distance (Feet): 250 Feet Assistive device: 1 person hand held assist Gait Pattern/deviations: Step-through pattern, Decreased stride length       General Gait Details: mild imbalance with HHA on R and min A throughout, pt self paced and turned at end of the hallway to return to her room.  Stairs            Wheelchair Mobility     Tilt Bed    Modified Rankin (Stroke Patients Only)       Balance Overall balance assessment: Needs assistance   Sitting balance-Leahy Scale: Good     Standing balance support: No upper extremity supported Standing balance-Leahy Scale: Fair Standing balance comment: static standing unsupported though needing HHA for mobility                             Pertinent Vitals/Pain Pain Assessment Pain Assessment: 0-10 Pain Score: 10-Worst pain ever Pain Location: stomach Pain Descriptors / Indicators: Aching, Sharp Pain Intervention(s): Repositioned    Home Living Family/patient expects to be discharged to:: Private residence Living Arrangements: Children Available Help at Discharge: Family Type of Home: Apartment Home Access: Stairs to enter Entrance Stairs-Rails: Doctor, General Practice of Steps: 7 Alternate Level Stairs-Number of Steps: 12 Home Layout: Two level;Able to live on main level with bedroom/bathroom;1/2 bath on main level Home Equipment: Tub bench;Rolling Walker (2 wheels);Cane - single point;Hospital bed;Wheelchair -  manual      Prior Function Prior Level of Function : Needs assist             Mobility Comments: daughter helps up steps when showering       Extremity/Trunk Assessment   Upper Extremity Assessment Upper Extremity Assessment: Generalized weakness    Lower Extremity Assessment Lower Extremity  Assessment: Generalized weakness       Communication   Communication Communication: No apparent difficulties    Cognition Arousal: Alert Behavior During Therapy: WFL for tasks assessed/performed, Flat affect   PT - Cognitive impairments: No apparent impairments                         Following commands: Intact       Cueing Cueing Techniques: Verbal cues     General Comments General comments (skin integrity, edema, etc.): oozing gold brown colored drainage from around G-tube site, some drainage in canister with intermittent suction to tube; HR stable, BP stable throughout though pt reported dizziness (HR 85; BP seated 150's/80's; standing high 130's/70's    Exercises     Assessment/Plan    PT Assessment Patient needs continued PT services  PT Problem List Decreased strength;Decreased activity tolerance;Decreased balance;Decreased mobility;Decreased knowledge of precautions;Decreased knowledge of use of DME       PT Treatment Interventions DME instruction;Gait training;Patient/family education;Stair training;Functional mobility training;Therapeutic activities;Therapeutic exercise;Balance training    PT Goals (Current goals can be found in the Care Plan section)  Acute Rehab PT Goals Patient Stated Goal: return home PT Goal Formulation: With patient Time For Goal Achievement: 05/10/24 Potential to Achieve Goals: Good    Frequency Min 2X/week     Co-evaluation               AM-PAC PT 6 Clicks Mobility  Outcome Measure Help needed turning from your back to your side while in a flat bed without using bedrails?: A Little Help needed moving from lying on your back to sitting on the side of a flat bed without using bedrails?: A Little Help needed moving to and from a bed to a chair (including a wheelchair)?: A Little Help needed standing up from a chair using your arms (e.g., wheelchair or bedside chair)?: A Little Help needed to walk in hospital  room?: A Little Help needed climbing 3-5 steps with a railing? : Total 6 Click Score: 16    End of Session Equipment Utilized During Treatment: Gait belt Activity Tolerance: Patient limited by fatigue;Patient limited by pain Patient left: with call bell/phone within reach;with bed alarm set;in bed Nurse Communication: Patient requests pain meds PT Visit Diagnosis: Muscle weakness (generalized) (M62.81);Other abnormalities of gait and mobility (R26.89);Pain;Difficulty in walking, not elsewhere classified (R26.2) Pain - part of body:  (abdomen)    Time: 1350-1410 PT Time Calculation (min) (ACUTE ONLY): 20 min   Charges:   PT Evaluation $PT Eval Moderate Complexity: 1 Mod   PT General Charges $$ ACUTE PT VISIT: 1 Visit         Micheline Portal, PT Acute Rehabilitation Services Office:[phone] 05/03/2024   Montie Portal 05/03/2024, 5:14 PM

## 2024-05-03 NOTE — Progress Notes (Signed)
 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  Patient apparently has not been using tube.  Admitted for hypocalcemia, intractable nausea and vomiting.  GI/palliative care/IR were consulted.  Assessment & Plan:   Severe hypocalcemia - Possibly due to recent denosumab .  Continue repletion.  Improving.  Monitor  Intractable nausea and vomiting -CT abdomen and pelvis was unrevealing except for stool in colon.  Possibly due to hypocalcemia as well. -GI signed off on 05/01/2024.  Continue PPI twice a day.  Continue scopolamine  patch along with Zyprexa  and Decadron  - Tolerating regular diet since yesterday.  Patient wants to continue regular diet.  Leaking G tube site -Substantial challenge over time. Seen by interventional radiology, gastroenterology, appreciate their assessment and recommendations.  -Removing the G tube has not been recommended given the concern for worsening fistula and skin damage.  -Patient requested regular food on 05/02/2024 although IR was concerned that eating solid food might cause clogging of the tube and recommended liquid diet to prevent clogging.  She wants to keep eating regular food today as well.  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  Patient remains full code although also enrolled with home hospice.  Palliative care following.  Prognosis is very poor  Moderate protein calorie malnutrition  Diffuse colonic diverticulosis without evidence of diverticulitis Large amount of stool in the rectosigmoid colon - Continue bowel regimen  Sepsis has been ruled out -Antibiotics discontinued already  Thrombocytopenia -Resolved  Anemia of chronic disease - From cancer.  Hemoglobin is down to 7 this morning.  Patient is dizzy.  Will transfuse 1 unit packed red cells.  Monitor H&H. Leukopenia - Mild.  Monitor intermittently  Physical deconditioning - PT eval   DVT prophylaxis: Lovenox  Code Status: Full Family Communication: None at bedside Disposition Plan: Status is: Inpatient Remains inpatient appropriate because: Of severity of illness    Consultants: GI/palliative care/IR/oncology  Procedures: None  Antimicrobials:  Anti-infectives (From admission, onward)    Start     Dose/Rate Route Frequency Ordered Stop   04/30/24 0500  vancomycin  (VANCOCIN ) IVPB 1000 mg/200 mL premix  Status:  Discontinued        1,000 mg 200 mL/hr over 60 Minutes Intravenous Every 12 hours 04/29/24 2116 04/30/24 1357   04/29/24 2200  ceFEPIme  (MAXIPIME ) 2 g in sodium chloride  0.9 % 100 mL IVPB  Status:  Discontinued        2 g 200 mL/hr over 30 Minutes Intravenous Every 8 hours 04/29/24 2116 04/30/24 1357   04/29/24 1545  ceFEPIme  (MAXIPIME ) 2 g in sodium chloride  0.9 % 100 mL IVPB        2 g 200 mL/hr over 30 Minutes Intravenous  Once 04/29/24 1535 04/29/24 1617   04/29/24 1530  vancomycin  (VANCOCIN ) IVPB 1000 mg/200 mL premix        1,000 mg 200 mL/hr over 60 Minutes Intravenous  Once 04/29/24 1526 04/29/24 1721  Subjective: Patient seen and examined at bedside.  Feels dizzy.  Wants to keep eating regular food.  Denies any current nausea or vomiting.  No fever or worsening abdominal pain  reported. Objective: Vitals:   05/02/24 1335 05/02/24 1939 05/03/24 0515 05/03/24 0817  BP:  109/77 103/61 108/75  Pulse: 72 70 64   Resp:  16  16  Temp: 97.8 F (36.6 C) 98.3 F (36.8 C) 98.4 F (36.9 C)   TempSrc: Oral Oral Oral   SpO2: 100% 97%    Weight:      Height:        Intake/Output Summary (Last 24 hours) at 05/03/2024 0921 Last data filed at 05/02/2024 2315 Gross per 24 hour  Intake 840 ml  Output 200 ml  Net 640 ml   Filed Weights   04/29/24 1423 04/29/24 1441 04/29/24 2226  Weight: 72.6 kg 72.6 kg 68.2 kg    Examination:  General: On room air.  No distress.  Chronically ill and deconditioned looking. ENT/neck: No thyromegaly.  JVD is not elevated  respiratory: Decreased breath sounds at bases bilaterally with some crackles; no wheezing  CVS: S1-S2 heard, rate controlled currently Abdominal: Soft, nontender, slightly distended; no organomegaly, bowel sounds are heard.  G-tube is present Extremities: Trace lower extremity edema; no cyanosis  CNS: Awake and alert.  Still slow to respond.  No focal neurologic deficit.  Moves extremities Lymph: No obvious lymphadenopathy Skin: No obvious ecchymosis/lesions  psych: Affect is mostly flat.  Not agitated currently.   Musculoskeletal: No obvious joint swelling/deformity   Data Reviewed: I have personally reviewed following labs and imaging studies  CBC: Recent Labs  Lab 04/29/24 1520 04/29/24 1532 04/29/24 2302 04/30/24 0848 05/02/24 0110 05/03/24 0207  WBC 3.2*  --  4.2 3.1* 3.8* 3.8*  NEUTROABS 2.2  --   --  2.2  --  2.7  HGB 9.1* 9.9* 8.7* 7.3* 7.5* 7.0*  HCT 28.8* 29.0* 28.0* 22.9* 24.5* 22.8*  MCV 85.5  --  86.4 86.7 86.9 88.0  PLT 169  --  136* 119* 169 173   Basic Metabolic Panel: Recent Labs  Lab 04/29/24 1520 04/29/24 1532 04/29/24 2301 04/29/24 2312 04/30/24 0848 04/30/24 2005 05/01/24 0414 05/01/24 1751 05/02/24 0110 05/02/24 1215 05/02/24 2008 05/03/24 0207  NA 135   < > 138    < >  --  135 137 135 135  --   --  137  K 3.3*   < > 3.1*   < >  --  3.9 4.7 4.4 4.5  --   --  4.2  CL 99   < > 105   < >  --  102 106 103 104  --   --  104  CO2 23  --  21*   < >  --  23 24 23 25   --   --  27  GLUCOSE 89   < > 108*   < >  --  111* 112* 109* 130*  --   --  131*  BUN 7   < > <5*   < >  --  <5* <5* <5* <5*  --   --  6  CREATININE 0.38*   < > 0.38*   < >  --  0.37* <0.30* 0.36* 0.34*  --   --  0.43*  CALCIUM  5.0*  --  5.2*   < >  --  6.7* 7.4* 8.9 8.5*  --  8.8* 8.5*  MG 1.7  --  1.5*  --  1.4*  --   --   --   --  1.8  --  1.8  PHOS  --   --   --   --  1.6*  --   --   --   --  2.3*  --   --    < > = values in this interval not displayed.   GFR: Estimated Creatinine Clearance: 89.6 mL/min (A) (by C-G formula based on SCr of 0.43 mg/dL (L)). Liver Function Tests: Recent Labs  Lab 04/29/24 1520 05/01/24 0414 05/02/24 0110 05/03/24 0207  AST 19 11* 10* 12*  ALT 16 12 10 10   ALKPHOS 241* 205* 215* 220*  BILITOT 0.7 0.4 0.4 0.3  PROT 6.8 5.7* 5.9* 5.5*  ALBUMIN  3.5 3.1* 3.3* 3.1*   Recent Labs  Lab 04/29/24 1520  LIPASE 16   No results for input(s): AMMONIA in the last 168 hours. Coagulation Profile: No results for input(s): INR, PROTIME in the last 168 hours. Cardiac Enzymes: No results for input(s): CKTOTAL, CKMB, CKMBINDEX, TROPONINI in the last 168 hours. BNP (last 3 results) No results for input(s): PROBNP in the last 8760 hours. HbA1C: No results for input(s): HGBA1C in the last 72 hours. CBG: No results for input(s): GLUCAP in the last 168 hours. Lipid Profile: No results for input(s): CHOL, HDL, LDLCALC, TRIG, CHOLHDL, LDLDIRECT in the last 72 hours. Thyroid  Function Tests: No results for input(s): TSH, T4TOTAL, FREET4, T3FREE, THYROIDAB in the last 72 hours. Anemia Panel: No results for input(s): VITAMINB12, FOLATE, FERRITIN, TIBC, IRON, RETICCTPCT in the last 72 hours. Sepsis Labs: Recent Labs   Lab 04/29/24 1531  LATICACIDVEN 0.4*    Recent Results (from the past 240 hours)  Blood culture (routine x 2)     Status: None (Preliminary result)   Collection Time: 04/29/24  3:05 PM   Specimen: BLOOD  Result Value Ref Range Status   Specimen Description   Final    BLOOD BLOOD RIGHT HAND Performed at [facility], [address], [place], [place] [address]    Special Requests   Final    BOTTLES DRAWN AEROBIC ONLY Blood Culture results may not be optimal due to an inadequate volume of blood received in culture bottles Performed at [facility], [address], [place], [place] [address]    Culture   Final    NO GROWTH 4 DAYS Performed at [facility], [address] [place], [place] [address]    Report Status PENDING  Incomplete  Blood culture (routine x 2)     Status: None (Preliminary result)   Collection Time: 04/29/24  3:20 PM   Specimen: BLOOD  Result Value Ref Range Status   Specimen Description   Final    BLOOD CHEST RIGHT PORTA CATH Performed at [facility], [address] [place], [place] [address]    Special Requests   Final    BOTTLES DRAWN AEROBIC AND ANAEROBIC Blood Culture adequate volume Performed at [facility], [address] [place], [place] [address]    Culture   Final    NO GROWTH 4 DAYS Performed at [facility], [address] [place], [place] [address]    Report Status PENDING  Incomplete         Radiology Studies: No results found.      Scheduled Meds:  calcitRIOL   0.25 mcg Oral BID   calcium  carbonate  200 mg of  elemental calcium  Oral TID   Chlorhexidine  Gluconate Cloth  6 each Topical Daily   dexamethasone   2 mg Oral Q6H   Followed by   dexamethasone   2 mg Oral Q8H   enoxaparin  (LOVENOX ) injection  40 mg Subcutaneous Q24H   fentaNYL   1 patch Transdermal Q72H   Gerhardt's butt cream   Topical Daily   OLANZapine  zydis  5 mg Oral QHS    scopolamine   1 patch Transdermal Q72H   Continuous Infusions:  calcium  gluconate 930 mg of elemental calcium  in sodium chloride  0.9 % 1,000 mL infusion 0.5 mg/kg/hr of elemental calcium  (05/02/24 1435)          Sophie Mao, MD [facility] 05/03/2024, 9:21 AM

## 2024-05-04 ENCOUNTER — Other Ambulatory Visit: Payer: Self-pay

## 2024-05-04 ENCOUNTER — Encounter: Payer: Self-pay | Admitting: Hematology and Oncology

## 2024-05-04 ENCOUNTER — Other Ambulatory Visit (HOSPITAL_COMMUNITY): Payer: Self-pay

## 2024-05-04 ENCOUNTER — Encounter: Payer: Self-pay | Admitting: General Practice

## 2024-05-04 LAB — COMPREHENSIVE METABOLIC PANEL WITH GFR
ALT: 14 U/L (ref 0–44)
AST: 16 U/L (ref 15–41)
Albumin: 3 g/dL — ABNORMAL LOW (ref 3.5–5.0)
Alkaline Phosphatase: 242 U/L — ABNORMAL HIGH (ref 38–126)
Anion gap: 7 (ref 5–15)
BUN: 12 mg/dL (ref 6–20)
CO2: 29 mmol/L (ref 22–32)
Calcium: 8 mg/dL — ABNORMAL LOW (ref 8.9–10.3)
Chloride: 103 mmol/L (ref 98–111)
Creatinine, Ser: 0.53 mg/dL (ref 0.44–1.00)
GFR, Estimated: >60 mL/min (ref >60)
Glucose, Bld: 108 mg/dL — ABNORMAL HIGH (ref 70–99)
Potassium: 4.2 mmol/L (ref 3.5–5.1)
Sodium: 138 mmol/L (ref 135–145)
Total Bilirubin: 0.3 mg/dL (ref 0.0–1.2)
Total Protein: 5.3 g/dL — ABNORMAL LOW (ref 6.5–8.1)

## 2024-05-04 LAB — CULTURE, BLOOD (ROUTINE X 2)
Culture: NO GROWTH
Culture: NO GROWTH
Special Requests: ADEQUATE

## 2024-05-04 LAB — TYPE AND SCREEN
ABO/RH(D): B POS
Antibody Screen: NEGATIVE
Unit division: 0

## 2024-05-04 LAB — CALCIUM, IONIZED: Calcium, Ionized, Serum: 5 mg/dL (ref 4.5–5.6)

## 2024-05-04 LAB — BPAM RBC
Blood Product Expiration Date: 202512112359
ISSUE DATE / TIME: 202511131636
Unit Type and Rh: 7300

## 2024-05-04 LAB — CBC WITH DIFFERENTIAL/PLATELET
Abs Immature Granulocytes: 0.1 K/uL — ABNORMAL HIGH (ref 0.00–0.07)
Band Neutrophils: 1 %
Basophils Absolute: 0 K/uL (ref 0.0–0.1)
Basophils Relative: 0 %
Eosinophils Absolute: 0 K/uL (ref 0.0–0.5)
Eosinophils Relative: 0 %
HCT: 24.9 % — ABNORMAL LOW (ref 36.0–46.0)
Hemoglobin: 8 g/dL — ABNORMAL LOW (ref 12.0–15.0)
Lymphocytes Relative: 17 %
Lymphs Abs: 0.9 K/uL (ref 0.7–4.0)
MCH: 27.6 pg (ref 26.0–34.0)
MCHC: 32.1 g/dL (ref 30.0–36.0)
MCV: 85.9 fL (ref 80.0–100.0)
Metamyelocytes Relative: 1 %
Monocytes Absolute: 0.1 K/uL (ref 0.1–1.0)
Monocytes Relative: 1 %
Myelocytes: 1 %
Neutro Abs: 4.4 K/uL (ref 1.7–7.7)
Neutrophils Relative %: 79 %
Platelets: 175 K/uL (ref 150–400)
RBC: 2.9 MIL/uL — ABNORMAL LOW (ref 3.87–5.11)
RDW: 17.2 % — ABNORMAL HIGH (ref 11.5–15.5)
Smear Review: NORMAL
WBC: 5.5 K/uL (ref 4.0–10.5)
nRBC: 0.4 % — ABNORMAL HIGH (ref 0.0–0.2)

## 2024-05-04 LAB — MAGNESIUM: Magnesium: 1.6 mg/dL — ABNORMAL LOW (ref 1.7–2.4)

## 2024-05-04 MED ORDER — OXYCODONE HCL 5 MG PO TABS
15.0000 mg | ORAL_TABLET | ORAL | Status: DC | PRN
  Administered 2024-05-04: 15 mg via ORAL
  Filled 2024-05-04: qty 3

## 2024-05-04 MED ORDER — PANTOPRAZOLE SODIUM 40 MG PO TBEC
40.0000 mg | DELAYED_RELEASE_TABLET | Freq: Two times a day (BID) | ORAL | 1 refills | Status: DC
  Filled 2024-05-04: qty 30, 15d supply, fill #0

## 2024-05-04 MED ORDER — MAGNESIUM SULFATE 2 GM/50ML IV SOLN
2.0000 g | Freq: Once | INTRAVENOUS | Status: AC
  Administered 2024-05-04: 2 g via INTRAVENOUS
  Filled 2024-05-04: qty 50

## 2024-05-04 MED ORDER — SENNA 8.6 MG PO TABS
1.0000 | ORAL_TABLET | Freq: Two times a day (BID) | ORAL | 0 refills | Status: DC
  Filled 2024-05-04: qty 60, 30d supply, fill #0

## 2024-05-04 MED ORDER — POLYETHYLENE GLYCOL 3350 17 GM/SCOOP PO POWD
17.0000 g | Freq: Every day | ORAL | 0 refills | Status: DC | PRN
  Filled 2024-05-04: qty 238, 14d supply, fill #0

## 2024-05-04 MED ORDER — ZINC OXIDE 20 % EX OINT
TOPICAL_OINTMENT | Freq: Two times a day (BID) | CUTANEOUS | 0 refills | Status: DC
  Filled 2024-05-04: qty 56.7, fill #0

## 2024-05-04 MED ORDER — FENTANYL 100 MCG/HR TD PT72
1.0000 | MEDICATED_PATCH | TRANSDERMAL | 0 refills | Status: DC
  Filled 2024-05-04: qty 5, 15d supply, fill #0

## 2024-05-04 MED ORDER — SCOPOLAMINE 1 MG/3DAYS TD PT72
1.0000 | MEDICATED_PATCH | TRANSDERMAL | 12 refills | Status: AC
  Filled 2024-05-04: qty 10, 30d supply, fill #0

## 2024-05-04 MED ORDER — DEXAMETHASONE 2 MG PO TABS
2.0000 mg | ORAL_TABLET | Freq: Three times a day (TID) | ORAL | 0 refills | Status: DC
  Filled 2024-05-04: qty 90, 30d supply, fill #0

## 2024-05-04 MED ORDER — HEPARIN SOD (PORK) LOCK FLUSH 100 UNIT/ML IV SOLN
500.0000 [IU] | INTRAVENOUS | Status: AC | PRN
  Administered 2024-05-04: 500 [IU]

## 2024-05-04 MED ORDER — MAGNESIUM OXIDE -MG SUPPLEMENT 400 (240 MG) MG PO TABS
400.0000 mg | ORAL_TABLET | Freq: Two times a day (BID) | ORAL | 0 refills | Status: DC
  Filled 2024-05-04: qty 30, 15d supply, fill #0

## 2024-05-04 MED ORDER — ZINC OXIDE 20 % EX OINT
TOPICAL_OINTMENT | Freq: Two times a day (BID) | CUTANEOUS | Status: DC
  Administered 2024-05-04: 1 via TOPICAL
  Filled 2024-05-04: qty 28.35

## 2024-05-04 NOTE — Discharge Summary (Signed)
 Physician Discharge Summary  Chelsea Hansen FMW:[id number] DOB: July 07, 1970 DOA: 04/29/2024  PCP: [name], NP  Admit date: 04/29/2024 Discharge date: 05/04/2024  Admitted From: Home Disposition: Home  Recommendations for Outpatient Follow-up:  Follow up with PCP in 1 week with repeat CBC/BMP Outpatient follow-up with oncology/palliative care/IR. 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 Patient apparently has not been using tube. Admitted for hypocalcemia, intractable nausea and vomiting. GI/palliative care/IR were consulted.  During the hospitalization, calcium  level has improved: IV calcium  drip has been discontinued.  Oncology follow-up appreciated: Stated that she does not have any treatment options.  Palliative care has been following this patient but patient continues to remain full code and has now revoked home hospice.  She is currently tolerating regular diet although her G-tube keeps leaking.  She feels okay to be discharged home today.  Discharge patient home today with outpatient follow-up with PCP/oncology/palliative care/IR.  Overall prognosis is very poor.  Discharge Diagnoses:   Severe hypocalcemia - Possibly due to recent denosumab .  Off IV calcium  drip.  Continue repletion orally.  Improving.  Monitor as an outpatient.   Intractable nausea and  vomiting -CT abdomen and pelvis was unrevealing except for stool in colon.  Possibly due to hypocalcemia as well. -GI signed off on 05/01/2024.  Continue PPI twice a day.  Continue scopolamine  patch along with Zyprexa  and Decadron  - Tolerating regular currently.  Patient wants to continue regular diet. -Discharge patient home today.   Leaking G tube site -Substantial challenge over time. Seen by interventional radiology, gastroenterology, appreciate their assessment and recommendations.  -Removing the G tube has not been recommended given the concern for worsening fistula and skin damage.  -Patient requested regular food on 05/02/2024 although IR was concerned that eating solid food might cause clogging of the tube and recommended liquid diet to prevent clogging.  She wants to keep eating regular food today as well.  G-tube continues to leak because patient continues to eat regular food.  Outpatient follow-up with IR.   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  Patient remains full code and now has revoked home hospice.  Palliative care following.  Prognosis is very poor.  Outpatient follow-up with palliative care.  Patient wants to seek second opinion with oncology at a tertiary care facility.   Moderate protein calorie malnutrition   Diffuse colonic diverticulosis without evidence of diverticulitis Large amount of stool in the rectosigmoid colon - Continue bowel regimen   Sepsis has been ruled out -Antibiotics discontinued already   Thrombocytopenia -Resolved   Anemia of chronic disease - From cancer.  Patient received 1 unit packed red cells transfusion on 05/03/2024 for hemoglobin of 7.  Subsequently, hemoglobin is 8 today.   Outpatient follow-up.    Leukopenia - Resolved   Physical deconditioning - PT  recommended home health PT  Discharge Instructions  Discharge Instructions     Diet - low sodium heart healthy   Complete by: As directed    Discharge wound care:   Complete by: As directed    As per wound care RN recommendations   Increase activity slowly   Complete by: As directed       Allergies as of 05/04/2024       Reactions   Enhertu  [fam-trastuzumab  Deruxtec-nxki] Other (See Comments)   Headache and chest pain. See progress note from 02/16/2024. Patient able to complete infusion.   Xgeva  [denosumab ] Other (See Comments)   Severe prolonged hypocalcemia needing hospitalization        Medication List     STOP taking these medications    Calcium  Carb-Cholecalciferol  600-10 MG-MCG Tabs Commonly known as: Calcium  600+D   feeding supplement (OSMOLITE 1.2 CAL) Liqd   fentaNYL  75 MCG/HR Commonly known as: DURAGESIC  Replaced by: fentaNYL  100 MCG/HR   hydrocortisone 10 MG tablet Commonly known as: CORTEF   omeprazole  40 MG capsule Commonly known as: PRILOSEC   Phospha 250 Neutral 155-852-130 MG Tabs   potassium chloride  20 MEQ packet Commonly known as: KLOR-CON    sorbitol  70 % Soln       TAKE these medications    albuterol  108 (90 Base) MCG/ACT inhaler Commonly known as: VENTOLIN  HFA Inhale 2 puffs into the lungs every 6 (six) hours as needed for wheezing or shortness of breath.   calcitRIOL  0.25 MCG capsule Commonly known as: ROCALTROL  Take 1 capsule (0.25 mcg total) by mouth 2 (two) times daily.   calcium  carbonate 1250 (500 Ca) MG tablet Commonly known as: OS-CAL - dosed in mg of elemental calcium  Take 5 tablets (6,250 mg total) by mouth 3 (three) times daily with meals.   CertaVite/Antioxidants Tabs Take 1 tablet by mouth daily.   dexamethasone  2 MG tablet Commonly known as: DECADRON  Take 1 tablet (2 mg total) by mouth every 8 (eight) hours.    ergocalciferol  (VITAMIN D2) 200 MCG/ML drops Commonly known as: DRISDOL  Take 1 mL (8,000 Units total) by mouth daily.   fentaNYL  100 MCG/HR Commonly known as: DURAGESIC  Place 1 patch onto the skin every 3 (three) days. Start taking on: May 06, 2024 Replaces: fentaNYL  75 MCG/HR   Gerhardt's butt cream Crea Apply topically 3 (three) times daily.   magnesium  oxide 400 (240 Mg) MG tablet Commonly known as: MAG-OX Take 1 tablet (400 mg total) by mouth 2 (two) times daily for 15 days.   methadone 5 MG tablet Commonly known as: DOLOPHINE Take 1 tablet by mouth three times a day   OLANZapine  zydis 5 MG disintegrating tablet Commonly known as: ZYPREXA  Take 1 tablet (5 mg total) by mouth at bedtime.   ondansetron  4 MG disintegrating tablet Commonly known as: ZOFRAN -ODT Dissolve 1 tablet by mouth every 4 hours as needed for nausea or vomiting   oxyCODONE  15 MG immediate release tablet Commonly known as: ROXICODONE  Take 1 tablet (15 mg total) by mouth every 4 (four) hours as needed for pain.   pantoprazole  40 MG tablet Commonly known as: Protonix  Take 1 tablet (40 mg total) by mouth 2 (two) times daily before a meal.   scopolamine  1 MG/3DAYS Commonly known as: TRANSDERM-SCOP Place 1 patch (1 mg total) onto the skin every 3 (three) days. Start taking on: May 06, 2024   zinc  oxide 20 % ointment Apply topically every 12 (twelve) hours.  Discharge Care Instructions  (From admission, onward)           Start     Ordered   05/04/24 0000  Discharge wound care:       Comments: As per wound care RN recommendations   05/04/24 1101                Follow-up Information     [name], NP. Schedule an appointment as soon as possible for a visit in 1 week(s).   Specialty: Internal Medicine Contact information: [address] [place] [address] [phone]         [name], MD. Schedule an appointment as soon as  possible for a visit in 1 week(s).   Specialty: Hematology and Oncology Contact information: [address] [place] [address] [phone]         [facility]. Schedule an appointment as soon as possible for a visit in 1 week(s).   Specialty: Radiology Contact information: [address] [place]  [address] [phone]               Allergies  Allergen Reactions   Enhertu  [Fam-Trastuzumab  Deruxtec-Nxki] Other (See Comments)    Headache and chest pain. See progress note from 02/16/2024. Patient able to complete infusion.   Xgeva  [Denosumab ] Other (See Comments)    Severe prolonged hypocalcemia needing hospitalization    Consultations: Oncology/palliative care/IR/GI   Procedures/Studies: CT ABDOMEN PELVIS W CONTRAST Result Date: 04/29/2024 EXAM: CT ABDOMEN AND PELVIS WITH CONTRAST 04/29/2024 05:42:44 PM TECHNIQUE: CT of the abdomen and pelvis was performed with the administration of 100 mL of iohexol  (OMNIPAQUE ) 300 MG/ML solution. Multiplanar reformatted images are provided for review. Automated exposure control, iterative reconstruction, and/or weight-based adjustment of the mA/kV was utilized to reduce the radiation dose to as low as reasonably achievable. COMPARISON: Chest abdomen and pelvis 04/09/2024 CLINICAL HISTORY: Sepsis; ab pain, pururlent drainage from G tube. Patient has hx of omental mets. FINDINGS: LOWER CHEST: There is no patchy atelectasis/airspace disease in the lingula. LIVER: Small cysts are present measuring up to 12 mm. GALLBLADDER AND BILE DUCTS: Gallbladder is unremarkable. No biliary ductal dilatation. SPLEEN: No acute abnormality. PANCREAS: No acute abnormality. ADRENAL GLANDS: No acute abnormality. KIDNEYS, URETERS AND BLADDER: There is mild right-sided hydroureteronephrosis without obstructing calculus identified. No stones in the kidneys or ureters. No perinephric or  periureteral stranding. Urinary bladder is unremarkable. GI AND BOWEL: Percutaneous gastrostomy tube is in the body of the stomach. There is no fluid collection or significant inflammation surrounding the PEG tube. There is some mild gastric wall thickening at the PEG tube insertion site. There is diffuse colonic diverticulosis. There are scattered colonic diverticula. There is a large amount of stool in the rectosigmoid colon. The appendix appears normal. There is no bowel obstruction. PERITONEUM AND RETROPERITONEUM: No ascites. No free air. No discrete peritoneal nodules are identified. VASCULATURE: Aorta is normal in caliber. LYMPH NODES: No lymphadenopathy. REPRODUCTIVE ORGANS: No acute abnormality. BONES AND SOFT TISSUES: Diffuse sclerotic osseous metastatic disease again noted. There is stable chronic compression deformity of L3. No acute osseous abnormality. No focal soft tissue abnormality. IMPRESSION: 1. Mild right-sided hydroureteronephrosis without obstructing calculus identified. 2. Diffuse colonic diverticulosis without evidence of diverticulitis. Large amount of stool in the rectosigmoid colon. 3. Percutaneous gastrostomy tube in appropriate position without surrounding fluid collection; mild gastric wall thickening at the insertion site, likely reactive. 4. Diffuse sclerotic osseous metastatic disease again noted; stable chronic compression deformity  of L3. Electronically signed by: Greig Pique MD 04/29/2024 06:10 PM EST RP Workstation: [id number]   DG Chest Port 1 View Result Date: 04/29/2024 EXAM: 1 VIEW(S) XRAY OF THE CHEST 04/29/2024 03:34:00 PM COMPARISON: Chest x-ray 02/21/2024. CLINICAL HISTORY: fever, vomiting FINDINGS: LINES, TUBES AND DEVICES: Right chest port catheter tip in the distal SVC, unchanged. LUNGS AND PLEURA: No focal pulmonary opacity. No pulmonary edema. No pleural effusion. No pneumothorax. HEART AND MEDIASTINUM: No acute abnormality of the cardiac and mediastinal  silhouettes. BONES AND SOFT TISSUES: No acute osseous abnormality. IMPRESSION: 1. No acute process. 2. Right chest port catheter tip in the distal SVC, unchanged. Electronically signed by: Greig Pique MD 04/29/2024 03:52 PM EST RP Workstation: [id number]   DG Abd 1 View Result Date: 04/14/2024 EXAM: 1 VIEW XRAY OF THE ABDOMEN 04/14/2024 12:59:00 AM COMPARISON: None available. CLINICAL HISTORY: [id number] Gastrostomy tube dysfunction (HCC) [id number]. Gastrostomy tube dysfunction Gastrostomy tube dysfunction FINDINGS: LINES, TUBES AND DEVICES: PEG tube is seen in the left upper quadrant. Chest port is partially imaged. BOWEL: Nonobstructive bowel gas pattern. There is a moderate to large amount of stool throughout the colon. SOFT TISSUES: No opaque urinary calculi. BONES: No acute osseous abnormality. IMPRESSION: 1. No bowel obstruction. 2. Moderate to large amount of stool throughout the colon. Electronically signed by: Greig Pique MD 04/14/2024 01:06 AM EDT RP Workstation: [id number]   IR Replc Gastro/Colonic Tube Percut W/Fluoro Result Date: 04/12/2024 INDICATION: PEG tube replacement Leaking catheter. History of pancreatic mass with duodenal ureteral stricture/obstruction. Surgical gastrostomy and jejunostomy tubes placed 01/10/2024. EXAM: FLUOROSCOPIC GUIDED EXCHANGE OF GASTROSTOMY TUBE COMPARISON:  CT CAP, 04/09/2024.  IR FLUOROSCOPY, 02/03/2024. MEDICATIONS: None. CONTRAST:  15mL OMNIPAQUE  IOHEXOL  300 MG/ML SOLN - administered into the gastric lumen FLUOROSCOPY: Radiation Exposure Index and estimated peak skin dose (PSD); Reference air kerma (RAK), 2 mGy. COMPLICATIONS: None immediate. PROCEDURE: Informed written consent was obtained from the patient and/or patient's representative after a discussion of the risks, benefits and alternatives to treatment. Questions regarding the procedure were encouraged and answered. A timeout was performed prior to the initiation of the procedure. The upper abdomen and  external portion of the existing gastrostomy tube was prepped and draped in the usual sterile fashion, and a sterile drape was applied covering the operative field. Maximum barrier sterile technique with sterile gowns and gloves were used for the procedure. A timeout was performed prior to the initiation of the procedure. The existing gastrostomy tube was injected with a small amount of contrast confirming appropriate positioning within the gastric lumen. The external portion of the gastrostomy tube was cut decompressing the retention balloon. Next, the gastrostomy tube was cannulated with a guidewire which was coiled within the gastric fundus. Over the stiff guide wire, the existing 28 Fr gastrostomy tube was exchanged for a new, slightly larger, 30 Fr balloon inflatable gastrostomy tube. The balloon was inflated with saline and dilute contrast and pulled against the anterior inner lumen of the stomach and the external disc was cinched. Contrast was injected and a post procedural spot fluoroscopic image was obtained confirming appropriate positioning and functionality of the new gastrostomy tube. A dressing was applied. The patient tolerated the procedure well without immediate postprocedural complication. IMPRESSION: Successful fluoroscopic-guided exchange and upsize for a new 30 Fr gastrostomy tube. The gastrostomy tube is ready for immediate use. RECOMMENDATIONS: The patient will return to [facility] ([facility]) for routine feeding tube evaluation and exchange in 6 months. Thom Hall, MD [facility] Electronically  Signed   By: Thom Hall M.D.   On: 04/12/2024 13:22   CT CHEST ABDOMEN PELVIS W CONTRAST Result Date: 04/09/2024 EXAM: CT CHEST, ABDOMEN AND PELVIS WITH CONTRAST 04/09/2024 12:41:00 PM TECHNIQUE: CT of the chest, abdomen and pelvis was performed with the administration of 100 mL of iohexol  (OMNIPAQUE ) 300 MG/ML solution.  Multiplanar reformatted images are provided for review. Automated exposure control, iterative reconstruction, and/or weight based adjustment of the mA/kV was utilized to reduce the radiation dose to as low as reasonably achievable. COMPARISON: CT abdomen and pelvis 04/01/2024. CLINICAL HISTORY: Abdominal abscess (Ped 0-17y); metastatic cancer, now with fever. Suspect abdominal wall abscess. Exam time extended due to patient transport by CT staff. FINDINGS: CHEST: MEDIASTINUM AND LYMPH NODES: Right chest wall access port-a-cath with tip terminating in the right atrium. Heart and pericardium are unremarkable. The central airways are clear. No mediastinal, hilar or axillary lymphadenopathy. LUNGS AND PLEURA: Limited evaluation of the lungs due to motion artifact. No central pulmonary embolus with limited evaluation more distally due to motion artifact and timing of contrast. No focal consolidation or pulmonary edema. No pleural effusion or pneumothorax. ABDOMEN AND PELVIS: LIMITATIONS: Limited evaluation of the abdomen and pelvis due to motion artifact. LIVER: The liver is enlarged, measuring up to 21 cm. GALLBLADDER AND BILE DUCTS: Gallbladder is unremarkable. No biliary ductal dilatation. SPLEEN: No acute abnormality. PANCREAS: No acute abnormality. ADRENAL GLANDS: No acute abnormality. KIDNEYS, URETERS AND BLADDER: No stones in the kidneys or ureters. No hydronephrosis. No perinephric or periureteral stranding. Urinary bladder is unremarkable. No filling defects of the partially visualized collecting systems on delayed imaging. GI AND BOWEL: Question left lower quadrant ileostomy. Small hiatal hernia. Gastrostomy tube in appropriate position with tip and inflated balloon within the gastric lumen. Question interval development of pyloric and first portion of the duodenum trace bowel wall thickening and slight irregularity with markedly limited evaluation due to motion artifact. Colonic diverticulosis. No large bowel  wall thickening or dilatation. The appendix is unremarkable. No small bowel dilatation. There is no bowel obstruction. REPRODUCTIVE ORGANS: Status post hysterectomy. No adnexal mass. 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 No ascites. No free air. VASCULATURE: Aorta is normal in caliber. ABDOMINAL AND PELVIS LYMPH NODES: No lymphadenopathy. BONES AND SOFT TISSUES: Right breast lumpectomy. Questions mild dermal thickening and edema of the left breast (2.58). Diffuse mild-appearing soft tissue edema the the lower abdominal wall . Resolution of surrounding inflammatory changes along the left lateral abdominal wall musculature. Diffuse sclerotic changes metastatic lesions of the appendicular and axial skeleton. Chronic stable L3 fracture. No acute pathologic fracture identified. No focal soft tissue abnormality. IMPRESSION: 1. Questions mild dermal thickening and edema of the left breast. Recommend correlation with physical exam. 2. Question interval development of pyloric and first portion of the duodenum trace bowel wall thickening and slight irregularity with markedly limited evaluation due to motion artifact. This may represent artifact. 3. 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 4. Diffuse sclerotic changes metastatic lesions of the appendicular and axial skeleton. 5. Colonic diverticulosis with no acute diverticulitis. Electronically signed by: Morgane Naveau MD 04/09/2024 09:16 PM EDT RP Workstation: [id number]   DG Abd 2 Views Result Date: 04/04/2024 EXAM: 2 VIEW XRAY OF THE ABDOMEN 04/04/2024 05:02:00 PM COMPARISON: Abdominal x-ray 09/325. CT abdomen and pelvis 06/14/2024. CLINICAL HISTORY: Vomiting. Patient reports long episodes of nausea and vomiting starting today. FINDINGS: LINES, TUBES AND DEVICES: PEG tube in place. BOWEL: Nonobstructive bowel gas pattern. SOFT TISSUES: Phleboliths in  the pelvis. No opaque urinary calculi. BONES: Diffuse patchy  sclerotic densities are seen throughout the osseous structures compatible with metastatic disease, unchanged. IMPRESSION: 1. No acute abdominal abnormality. 2. Diffuse patchy sclerotic osseous metastases, unchanged. Electronically signed by: Greig Pique MD 04/04/2024 05:21 PM EDT RP Workstation: [id number]      Subjective: Patient seen and examined at bedside.  Wants to continue to eat regular fluid.  Hoping to go home today.  Patient continues to have leaking from the G-tube as per nursing staff.  No fever, vomiting or agitation reported.  Discharge Exam: Vitals:   05/03/24 2031 05/04/24 0518  BP: 121/77 (!) 133/90  Pulse: 71 74  Resp:    Temp: 98.5 F (36.9 C) 97.7 F (36.5 C)  SpO2: 98% 99%    General: Pt is alert, awake, not in acute distress.  Looks chronically ill and deconditioned.  On room air.  Flat affect.  Poor historian.  Slow to respond but answers questions appropriately. Cardiovascular: rate controlled, S1/S2 + Respiratory: bilateral decreased breath sounds at bases with scattered crackles Abdominal: Soft, NT, ND, bowel sounds +, G-tube present with dressing around Extremities: no edema, no cyanosis    The results of significant diagnostics from this hospitalization (including imaging, microbiology, ancillary and laboratory) are listed below for reference.     Microbiology: Recent Results (from the past 240 hours)  Blood culture (routine x 2)     Status: None   Collection Time: 04/29/24  3:05 PM   Specimen: BLOOD  Result Value Ref Range Status   Specimen Description   Final    BLOOD BLOOD RIGHT HAND Performed at [facility], [address], [place], [place] [address]    Special Requests   Final    BOTTLES DRAWN AEROBIC ONLY Blood Culture results may not be optimal due to an inadequate volume of blood received in culture bottles Performed at [facility], [address] [place], [place] [address]    Culture   Final     NO GROWTH 5 DAYS Performed at [facility], [address] [place], [place] [address]    Report Status 05/04/2024 FINAL  Final  Blood culture (routine x 2)     Status: None   Collection Time: 04/29/24  3:20 PM   Specimen: BLOOD  Result Value Ref Range Status   Specimen Description   Final    BLOOD CHEST RIGHT PORTA CATH Performed at [facility], [address], [place], [place] [address]    Special Requests   Final    BOTTLES DRAWN AEROBIC AND ANAEROBIC Blood Culture adequate volume Performed at [facility], [address], [place], [place] [address]    Culture   Final    NO GROWTH 5 DAYS Performed at [facility], [address], [place], [place] [address]    Report Status 05/04/2024 FINAL  Final     Labs: BNP (last 3 results) No results for input(s): BNP in the last 8760 hours. Basic Metabolic Panel: Recent Labs  Lab 04/29/24 2301 04/29/24 2312 04/30/24 0848 04/30/24 2005 05/01/24 0414 05/01/24 1751 05/02/24 0110 05/02/24 1215 05/02/24 2008 05/03/24 0207 05/04/24 0346  NA 138   < >  --    < > 137 135 135  --   --  137 138  K 3.1*   < >  --    < > 4.7 4.4 4.5  --   --  4.2 4.2  CL 105   < >  --    < > 106 103 104  --   --  104 103  CO2 21*   < >  --    < > 24 23 25   --   --  27 29  GLUCOSE 108*   < >  --    < > 112* 109* 130*  --   --  131* 108*  BUN <5*   < >  --    < > <5* <5* <5*  --   --  6 12  CREATININE 0.38*   < >  --    < > <0.30* 0.36* 0.34*  --   --  0.43* 0.53  CALCIUM  5.2*   < >  --    < > 7.4* 8.9 8.5*  --  8.8* 8.5* 8.0*  MG 1.5*  --  1.4*  --   --   --   --  1.8  --  1.8 1.6*  PHOS  --   --  1.6*  --   --   --   --  2.3*  --   --   --    < > = values in this interval not displayed.   Liver Function Tests: Recent Labs  Lab 04/29/24 1520 05/01/24 0414 05/02/24 0110 05/03/24 0207 05/04/24 0346  AST 19 11* 10* 12* 16  ALT 16 12 10 10 14   ALKPHOS 241* 205* 215* 220* 242*  BILITOT 0.7 0.4  0.4 0.3 0.3  PROT 6.8 5.7* 5.9* 5.5* 5.3*  ALBUMIN  3.5 3.1* 3.3* 3.1* 3.0*   Recent Labs  Lab 04/29/24 1520  LIPASE 16   No results for input(s): AMMONIA in the last 168 hours. CBC: Recent Labs  Lab 04/29/24 1520 04/29/24 1532 04/29/24 2302 04/30/24 0848 05/02/24 0110 05/03/24 0207 05/03/24 2238 05/04/24 0346  WBC 3.2*  --  4.2 3.1* 3.8* 3.8*  --  5.5  NEUTROABS 2.2  --   --  2.2  --  2.7  --  4.4  HGB 9.1*   < > 8.7* 7.3* 7.5* 7.0* 8.6* 8.0*  HCT 28.8*   < > 28.0* 22.9* 24.5* 22.8* 26.7* 24.9*  MCV 85.5  --  86.4 86.7 86.9 88.0  --  85.9  PLT 169  --  136* 119* 169 173  --  175   < > = values in this interval not displayed.   Cardiac Enzymes: No results for input(s): CKTOTAL, CKMB, CKMBINDEX, TROPONINI in the last 168 hours. BNP: Invalid input(s): POCBNP CBG: No results for input(s): GLUCAP in the last 168 hours. D-Dimer No results for input(s): DDIMER in the last 72 hours. Hgb A1c No results for input(s): HGBA1C in the last 72 hours. Lipid Profile No results for input(s): CHOL, HDL, LDLCALC, TRIG, CHOLHDL, LDLDIRECT in the last 72 hours. Thyroid  function studies No results for input(s): TSH, T4TOTAL, T3FREE, THYROIDAB in the last 72 hours.  Invalid input(s): FREET3 Anemia work up No results for input(s): VITAMINB12, FOLATE, FERRITIN, TIBC, IRON, RETICCTPCT in the last 72 hours. Urinalysis    Component Value Date/Time   COLORURINE YELLOW 04/30/2024 0929   APPEARANCEUR CLEAR 04/30/2024 0929   LABSPEC 1.011 04/30/2024 0929   PHURINE 7.0 04/30/2024 0929   GLUCOSEU NEGATIVE 04/30/2024 0929   HGBUR NEGATIVE 04/30/2024 0929   BILIRUBINUR NEGATIVE 04/30/2024 0929   KETONESUR 5 (A) 04/30/2024 0929   PROTEINUR NEGATIVE 04/30/2024 0929   UROBILINOGEN 0.2 11/01/2013 1246   NITRITE NEGATIVE 04/30/2024  9070   LEUKOCYTESUR NEGATIVE 04/30/2024 0929   Sepsis Labs Recent Labs  Lab 04/30/24 0848 05/02/24 0110  05/03/24 0207 05/04/24 0346  WBC 3.1* 3.8* 3.8* 5.5   Microbiology Recent Results (from the past 240 hours)  Blood culture (routine x 2)     Status: None   Collection Time: 04/29/24  3:05 PM   Specimen: BLOOD  Result Value Ref Range Status   Specimen Description   Final    BLOOD BLOOD RIGHT HAND Performed at [facility], [address] [place], [place] [address]    Special Requests   Final    BOTTLES DRAWN AEROBIC ONLY Blood Culture results may not be optimal due to an inadequate volume of blood received in culture bottles Performed at [facility], [address], [place], [place] [address]    Culture   Final    NO GROWTH 5 DAYS Performed at [facility], [address] [place], [place] [address]    Report Status 05/04/2024 FINAL  Final  Blood culture (routine x 2)     Status: None   Collection Time: 04/29/24  3:20 PM   Specimen: BLOOD  Result Value Ref Range Status   Specimen Description   Final    BLOOD CHEST RIGHT PORTA CATH Performed at [facility], [address] [place], [place] [address]    Special Requests   Final    BOTTLES DRAWN AEROBIC AND ANAEROBIC Blood Culture adequate volume Performed at [facility], [address], [place], [place] [address]    Culture   Final    NO GROWTH 5 DAYS Performed at [facility], [address] [place], [place] [address]    Report Status 05/04/2024 FINAL  Final     Time coordinating discharge: 35 minutes  SIGNED:   Sophie Mao, MD  [facility] 05/04/2024, 11:03 AM

## 2024-05-04 NOTE — Progress Notes (Signed)
 Daily Progress Note   Patient Name: Chelsea Hansen       Date: 05/04/2024 DOB: [birth year]  Age: 53 y.o. MRN#: [id number] Attending Physician: Cheryle Page, MD Primary Care Physician: [name], NP Admit Date: 04/29/2024  Reason for Consultation/Follow-up: Establishing goals of care and Non pain symptom management  Subjective: Awake alert, RN at bedside, pain is well controlled at the moment Patient says she might be going home later today Patient says that she has revoked hospice Wants to seek second opinion for her cancer at tertiary care center next week     Length of Stay: 5  Current Medications: Scheduled Meds:   calcitRIOL   0.25 mcg Oral BID   calcium  carbonate  200 mg of elemental calcium  Oral TID   Chlorhexidine  Gluconate Cloth  6 each Topical Daily   dexamethasone   2 mg Oral Q8H   enoxaparin  (LOVENOX ) injection  40 mg Subcutaneous Q24H   fentaNYL   1 patch Transdermal Q72H   Gerhardt's butt cream   Topical Daily   OLANZapine  zydis  5 mg Oral QHS   scopolamine   1 patch Transdermal Q72H   zinc  oxide   Topical Q12H    Continuous Infusions:  magnesium  sulfate bolus IVPB 2 g (05/04/24 1013)     PRN Meds: albuterol , HYDROmorphone  (DILAUDID ) injection, ondansetron  (ZOFRAN ) IV, oxyCODONE , sodium chloride  flush  Physical Exam         PEG site noted Awake alert Mild   distress due to abd pain No edema  Vital Signs: BP (!) 133/90 (BP Location: Right Arm)   Pulse 74   Temp 97.7 F (36.5 C) (Oral)   Resp 16   Ht 5' 11 (1.803 m)   Wt 68.2 kg   SpO2 99%   BMI 20.97 kg/m  SpO2: SpO2: 99 % O2 Device: O2 Device: Room Air O2 Flow Rate:    Intake/output summary:  Intake/Output Summary (Last 24 hours) at 05/04/2024 1033 Last data filed at 05/04/2024  0400 Gross per 24 hour  Intake 1069 ml  Output 601 ml  Net 468 ml   LBM: Last BM Date : 05/02/24 Baseline Weight: Weight: 72.6 kg Most recent weight: Weight: 68.2 kg       Palliative Assessment/Data:      Patient Active Problem List   Diagnosis Date Noted  Sepsis (HCC) 04/30/2024   Pressure injury of skin 04/30/2024   Cellulitis 04/29/2024   Cancer associated pain 04/12/2024   Medication management 04/11/2024   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   Need for emotional support 04/10/2024   Bacteremia due to Klebsiella pneumoniae 04/10/2024   DNR (do not resuscitate) discussion 04/10/2024   Counseling and coordination of care 04/10/2024   Goals of care, counseling/discussion 04/10/2024   Palliative care encounter 04/10/2024   Septic shock (HCC) 04/09/2024   Malnutrition of moderate degree 04/04/2024   Fall 04/01/2024   Neonatal cows' milk hypocalcemia 03/20/2024   Vomiting and diarrhea 03/19/2024   H/O bypass gastrojejunostomy 03/08/2024   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   Common bile duct dilation 03/07/2024   Biliary obstruction (HCC) 03/07/2024   Abnormal LFTs 03/07/2024   Chronic anemia 03/07/2024   Gastrostomy tube present (HCC) 02/10/2024   Irritant contact dermatitis associated with digestive stoma 02/10/2024   Protein-calorie malnutrition, moderate 02/03/2024   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 02/01/2024   Generalized abdominal pain 01/17/2024   Elevated CA 19-9 level 01/16/2024   Hypophosphatemia 01/04/2024   Hypomagnesemia 01/04/2024   Pancreatic lesion 12/29/2023   Unintentional weight loss 12/29/2023   N&V (nausea and vomiting) 12/29/2023   Duodenal obstruction 12/29/2023   Abnormal MRI of abdomen 12/26/2023   Abnormal CT of the abdomen 12/23/2023   Elevated liver enzymes 12/23/2023   Acute kidney injury 12/23/2023    AKI (acute kidney injury) 12/22/2023   Elevated LFTs 12/22/2023   Pancreatic mass 12/22/2023   Right sided weakness 12/22/2023   Hypotension 12/22/2023   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   Abnormal CT scan, colon 12/14/2023   Acute esophagitis 12/14/2023   Candida esophagitis (HCC) 12/14/2023   Therapeutic opioid-induced constipation (OIC) 12/11/2023   Hypocalcemia 12/10/2023   Fever of unknown origin 12/10/2023   Sinus tachycardia 12/10/2023   Acute kidney injury superimposed on chronic kidney disease 12/05/2023   Electrolyte abnormality 12/05/2023   Dehydration 12/05/2023   QT prolongation 12/05/2023   Intractable nausea and vomiting 11/24/2023   Neoplasm related pain 12/07/2022   Constipation 06/30/2022   Nausea and vomiting in adult 06/29/2022   Hematemesis 06/28/2022   Breast cancer metastasized to bone, right (HCC) 05/20/2022   Supraventricular tachycardia 07/08/2021   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   GERD (gastroesophageal reflux disease) 05/27/2021   HLD (hyperlipidemia) 05/27/2021   Essential hypertension 05/27/2021   Pain management contract signed 05/04/2021   Chronic pain syndrome (breast cancer) 05/04/2021   Palpitations 03/25/2021   Sinus bradycardia 03/25/2021   Daytime somnolence 03/25/2021   Snoring 03/25/2021   Obesity (BMI 30-39.9) 03/25/2021   Port-A-Cath in place 01/27/2021   Genetic testing 12/24/2020   Family history of uterine cancer 12/17/2020   Malignant neoplasm of upper-outer quadrant of right breast in female, estrogen receptor positive (HCC) 12/12/2020   Breast cancer (HCC) 12/12/2020   Hypokalemia 01/02/2012   Chronic asthma 01/01/2012   Bradycardia 01/01/2012   Syncope 12/31/2011   Mediastinal adenopathy 12/31/2011   Pancytopenia (HCC) 12/31/2011   Chest pain 12/31/2011    Palliative Care Assessment & Plan   Patient Profile:    Assessment:  Chelsea Hansen is a 53 y.o. female with metastatic breast cancer with several  recent hospitalizations due to progressive vs. Additional disease.  She was recently admitted in July 2025 for abdominal pain, nausea, vomiting due to duodenal stricture and SBO.  Also readmitted in October with Klebsiella bacteremia, sepsis, and poorly controlled G-tube leakage. She is readmitted to [facility] for ongoing leakage, nausea, vomiting.  Ongoing abdominal pain Nausea and vomiting Recently enrolled in home hospice services  Recommendations/Plan: Full code for now - discussed again with patient about it being appropriate to consider DNR DNI, she asks for continued full code status.  Home with home health care and outpatient palliative, patient has revoked hospice services.  Will connect with PMT at [facility] to ensure patient has a pain management follow up with them in the near future.  Patient's goals at this time are not in line with DNR DNI/comfort measures/hospice.  Pain regimen discussed with patient - fentanyl  patch 100 mcg Q 72 hours for long acting + Oxy IR 15 mg PO Q 4 hours PRN for short acting.  Continue decadron  taper    Goals of Care and Additional Recommendations: Limitations on Scope of Treatment: Full Scope Treatment  Code Status:    Code Status Orders  (From admission, onward)           Start     Ordered   04/29/24 2043  Full code  Continuous       Question:  By:  Answer:  Consent: discussion documented in EHR   04/29/24 2043           Code Status History     Date Active Date Inactive Code Status Order ID Comments User Context   04/01/2024 2221 04/19/2024 2128 Full Code [id number]  Alfornia Madison, MD ED   03/19/2024 2001 03/28/2024 2204 Full Code [id number]  Charlton Evalene RAMAN, MD Inpatient   03/05/2024 0620 03/09/2024 2305 Full Code [id number]  Franky Redia SAILOR, MD ED   02/28/2024 0046 02/28/2024 0735 Full Code [id number]  Charlton Evalene RAMAN, MD ED   02/01/2024 1707 02/06/2024 2331 Full Code [id number]  Royal Sill, MD Inpatient   12/22/2023 1914 01/17/2024 2318  Full Code [id number]  Silvester Ales, MD ED   12/10/2023 2305 12/14/2023 2036 Full Code [id number]  Lee Kingfisher, MD ED   12/05/2023 0454 12/07/2023 1846 Full Code [id number]  Keturah Carrier, MD ED   11/24/2023 1635 11/26/2023 2136 Full Code [id number]  Lou Claretta HERO, MD ED   07/06/2023 1327 07/07/2023 0503 Full Code [id number]  Hughes Simmonds, MD HOV   06/28/2022 2326 06/30/2022 1958 Full Code [id number]  Lonzell Emeline HERO, DO ED   12/31/2011 0902 01/02/2012 1255 Full Code [id number]  Tarpley, Briant Houston, RN Inpatient      Advance Directive Documentation    Flowsheet Row Most Recent Value  Type of Advance Directive Healthcare Power of Attorney  Pre-existing out of facility DNR order (yellow form or pink MOST form) --  MOST Form in Place? --    Prognosis:  Less than 6 months.   Discharge Planning: Home with home health and outpatient palliative support.   Care plan was discussed with  patient and RN    Thank you for allowing the Palliative Medicine Team to assist in the care of this patient.  High MDM     Greater than 50%  of this time was spent counseling and coordinating care related to the above assessment and plan.  Lonia Serve, MD  Please contact Palliative Medicine Team phone at [phone] for questions and concerns.

## 2024-05-04 NOTE — TOC Transition Note (Signed)
 Transition of Care [facility]) - Discharge Note   Patient Details  Name: Chelsea Hansen MRN: [id number] Date of Birth: 1971-06-04  Transition of [facility]) CM/SW Contact:  Bascom Service, RN Phone Number: 05/04/2024, 11:28 AM   Clinical Narrative: Recc HHPT-No HHC agency to accept-dtr Dena agreed to otpt PT-referral sent. Noted hospice revoced-[facility] rep Eleanor will provide PCS ot pt-[name](dtr) agreed. Family has own transport home.      Final next level of care: OP Rehab Barriers to Discharge: No Barriers Identified   Patient Goals and CMS Choice Patient states their goals for this hospitalization and ongoing recovery are:: Home CMS Medicare.gov Compare Post Acute Care list provided to:: Patient Choice offered to / list presented to : Patient  ownership interest in [facility].provided to:: Patient    Discharge Placement                       Discharge Plan and Services Additional resources added to the After Visit Summary for   In-house Referral: Clinical Social Work Discharge Planning Services: CM Consult Post Acute Care Choice: Hospice          DME Arranged: N/A DME Agency: NA                  Social Drivers of Health (SDOH) Interventions SDOH Screenings   Food Insecurity: No Food Insecurity (04/29/2024)  Recent Concern: Food Insecurity - Food Insecurity Present (02/16/2024)  Housing: Low Risk  (04/29/2024)  Transportation Needs: No Transportation Needs (04/29/2024)  Utilities: Not At Risk (04/29/2024)  Depression (PHQ2-9): Medium Risk (03/30/2024)  Financial Resource Strain: High Risk (02/13/2024)  Physical Activity: Inactive (02/13/2024)  Social Connections: Moderately Isolated (04/01/2024)  Stress: No Stress Concern Present (02/13/2024)  Tobacco Use: Low Risk  (04/29/2024)  Health Literacy: Adequate Health Literacy (02/13/2024)     Readmission Risk Interventions    05/02/2024   11:56 AM 04/30/2024   11:25 AM 03/20/2024     3:17 PM  Readmission Risk Prevention Plan  Transportation Screening Complete Complete   Medication Review Oceanographer) Complete Complete Complete  PCP or Specialist appointment within 3-5 days of discharge Complete  Complete  HRI or Home Care Consult Complete Complete Complete  SW Recovery Care/Counseling Consult Complete Complete Complete  Palliative Care Screening Complete Not Applicable Not Applicable  Skilled Nursing Facility Not Applicable Not Applicable Not Applicable

## 2024-05-04 NOTE — Progress Notes (Signed)
 SPIRITUAL CARE AND COUNSELING CONSULT NOTE   VISIT SUMMARY Followed up with [facility] inpatient, as she requests continued chaplain visits. She was in good spirits today, reporting that she is getting better and has had good time with family recently.   Of note, she states that her desire to be a full code is because she is worried that something other than cancer could occur prior to when cancer takes its likely course: If I die from cancer, then I die from cancer. But if something else tries to take me before then, I want them to resuscitate me and bring me back.   SPIRITUAL ENCOUNTER                                                                                                                                                                      Type of Visit: Follow up Care provided to:: Patient Conversation partners present during encounter: Nurse Referral source: Patient request Reason for visit: Routine spiritual support  SPIRITUAL FRAMEWORK  Presenting Themes: Goals in life/care, Values and beliefs Values/beliefs: God isn't ready for me yet. When God's ready for me, it will be my time. Community/Connection: Family Patient Stress Factors: Health changes, Major life changes   GOALS   Self/Personal Goals: Patient seeks second opinion at [facility] next week; I'm ok if there's something they can do; I'm ok if there isn't. I just want clarity. Clinical Care Goals: Provide continued availability for support   INTERVENTIONS   Spiritual Care Interventions Made: Compassionate presence, Reflective listening, Explored values/beliefs/practices/strengths   INTERVENTION OUTCOMES   Outcomes: Connection to values and goals of care, Awareness of support  SPIRITUAL CARE PLAN   Spiritual Care Issues Still Outstanding: Chaplain will continue to follow Follow up plan : Phone next week to talk about patient's birthday, which she is looking forward to celebrating with family   [facility] Chaplain  Olam Filiberto Lemming, [facility] M-F daytime pager [phone] [facility] 24/7 pager [phone] Voicemail [phone]

## 2024-05-04 NOTE — Progress Notes (Signed)
 Discharge meds in a secure bag delivered to patient in room by this RN

## 2024-05-04 NOTE — Progress Notes (Signed)
 Request received for IR eval for leaking G-tube.  Patient recently seen on 05/02/2024.  At that time she was and advised that loosening her bumper will result in leakage from around the G-tube.  She is already currently wearing a 30 French G-tube, no way to upsize tube for this.  Anticipated that downsizing tube and allowing tissue healing will not be successful she would likely experience delayed healing and copious leakage around the G-tube while waiting for tissue to close down around a smaller tube.   She has also been previously advised to only use 1 layer of gauze under the bumper rather than multiple and to use barrier cream.  APP at that time also made dietary recommendations while using venting and applied silver  nitrate to granular tissue around insertion site.  Please see 05/02/2024 IR note for additional details.  Spoke to patient and reinforced all of the above.  Patient verbalizes understanding and is amenable to continuing plan for discharge at this time.  Shunda Rabadi NP 05/04/2024 3:57 PM

## 2024-05-04 NOTE — Consult Note (Signed)
 This remote consultation was conducted using EMR wound digital images downloaded by the staff member. Pertinent information was reviewed in order to determine recommendations. Updated image will be placed in EMR.  WOC Nurse Consult Note: Reason for Consult: gastric tube skin irritation Wound type: MASD, caustic skin exposure, hypergranulation tissue Wound bed: dermatitis to periwound skin Drainage gastric contents Staff was using Gerhardt's paste around tubing, but would like to hold this as the main ingredient may migrate into the orifice. Recommend Mepilex foam dressing to stabilize the g-tube and absorb drainage.  Dressing procedure/placement/frequency:  Cleanse G-tube wound site with soap and water , rinse well and pat dry with guaze, okay if there is residual of paste on skin, apply thin layer of 20% zinc  oxide paste 1 inch around tubing, then cover with slit cut Mepilex 4 x 4 foam dressing and wrap around tubing to stabilize. Do not place gauze. May need Drawtex #[id number], trim to fit under Mepilex.  Secure Chat with primary nurse, she will do first dressing, patient is independent with her ostomy management- no ostomy needs at this time, patient will discharge and obtain a second option at [facility] for her oncology treatment; patient declined hospice at this time.  Sherrilyn Hals MSN RN CWOCN WOC [facility]  [phone] (Available from 7-3 pm Mon-Friday)

## 2024-05-04 NOTE — Progress Notes (Signed)
 Chelsea Hansen [address] Hospice hospital liaison note:    Patient revoked hospice services in order to seek a second opinion at [facility] next Tuesday.    Outpatient palliative referral will be initiated.    Please don't hesitate to reach out for any hospice related questions or concerns.   Eleanor Nail, [facility] liaison [phone]

## 2024-05-04 NOTE — Progress Notes (Signed)
 Patient G-tube dressing changed and new iliostomy bag applied. Pt concerned for copious amounts of drainage from G-tube. I used piston syringe to aspirate from G-tube with only 10 mL could be aspirated. Pt given all medications and taken by wheelchair to d/c by car via her ride.

## 2024-05-07 ENCOUNTER — Telehealth: Payer: Self-pay | Admitting: *Deleted

## 2024-05-07 ENCOUNTER — Other Ambulatory Visit (HOSPITAL_COMMUNITY): Payer: Self-pay

## 2024-05-07 NOTE — Transitions of Care (Post Inpatient/ED Visit) (Signed)
 05/07/2024  Name: Chelsea Hansen MRN: [id number] DOB: July 01, 1970  Today's TOC FU Call Status: Today's TOC FU Call Status:: Successful TOC FU Call Completed TOC FU Call Complete Date: 05/07/24  Patient's Name and Date of Birth confirmed. Name, DOB  Transition Care Management Follow-up Telephone Call Date of Discharge: 05/04/24 Discharge Facility: [facility]) Type of Discharge: Inpatient Admission Primary Inpatient Discharge Diagnosis:: Hypocalcemia How have you been since you were released from the hospital?: Better Any questions or concerns?: No  Items Reviewed: Did you receive and understand the discharge instructions provided?: Yes Medications obtained,verified, and reconciled?: No Medications Not Reviewed Reasons:: Other: (Patient is resting and declines medication review today) Dietary orders reviewed?: Yes Type of Diet Ordered:: low sodium heart healthy Do you have support at home?: Yes People in Home [RPT]: child(ren), adult Name of Support/Comfort Primary Source: [name]  Medications Reviewed Today:Patient declined Medication review today Medications Reviewed Today   Medications were not reviewed in this encounter     Home Care and Equipment/Supplies: Were Home Health Services Ordered?: No Any new equipment or medical supplies ordered?: No  Functional Questionnaire: Do you need assistance with bathing/showering or dressing?: No Do you need assistance with meal preparation?: Yes (Daughter and son assists) Do you need assistance with eating?: No Do you have difficulty maintaining continence: No Do you need assistance with getting out of bed/getting out of a chair/moving?: No Do you have difficulty managing or taking your medications?: No  Follow up appointments reviewed: PCP Follow-up appointment confirmed?: No (Patient prefers to call and schedule) MD Provider Line Number:[phone] Given: No Specialist Hospital Follow-up appointment confirmed?:  Yes Date of Specialist follow-up appointment?: 05/08/24 Follow-Up Specialty Provider:: [facility] for a second opinion Do you need transportation to your follow-up appointment?: No Do you understand care options if your condition(s) worsen?: Yes-patient verbalized understanding  SDOH Interventions Today    Flowsheet Row Most Recent Value  SDOH Interventions   Food Insecurity Interventions Intervention Not Indicated  Housing Interventions Intervention Not Indicated  Transportation Interventions Intervention Not Indicated  Utilities Interventions Intervention Not Indicated    Goals Addressed             This Visit's Progress    VBCI Transitions of Care (TOC) Care Plan       Problems:  Recent Hospitalization for treatment of Hypocalcemia No Hospital Follow Up Provider appointment Patient prefers to call and schedule with PCP  Goal:  Over the next 30 days, the patient will not experience hospital readmission  Interventions:  Transitions of Care: Doctor Visits  - discussed the importance of doctor visits Post discharge activity limitations prescribed by provider reviewed Reviewed Signs and symptoms of infection Reviewed patient has an appointment at [facility] for second opinion Discussed patient is working with Tobias, BSW for assistance with connecting with [employer] on 05/18/24  Patient Self Care Activities:  Attend all scheduled provider appointments Call pharmacy for medication refills 3-7 days in advance of running out of medications Notify RN Care Manager of TOC call rescheduling needs Participate in Transition of Care Program/Attend TOC scheduled calls Perform IADL's (shopping, preparing meals, housekeeping, managing finances) independently Work with the social worker to address care coordination needs and will continue to work with the clinical team to address health care and disease management related needs  Plan:  Telephone follow up appointment with care  management team member scheduled for:  05/15/24 at 11:00am         [name] RN, BSN [place]  [facility] RN Care Manager [phone]

## 2024-05-08 ENCOUNTER — Encounter: Payer: Self-pay | Admitting: Hematology and Oncology

## 2024-05-08 ENCOUNTER — Other Ambulatory Visit (HOSPITAL_COMMUNITY): Payer: Self-pay

## 2024-05-08 ENCOUNTER — Other Ambulatory Visit: Payer: Self-pay

## 2024-05-08 MED ORDER — MAGNESIUM OXIDE 400 MG PO TABS
400.0000 mg | ORAL_TABLET | Freq: Every day | ORAL | 0 refills | Status: DC
  Filled 2024-05-08: qty 5, 5d supply, fill #0

## 2024-05-08 MED ORDER — VITAMIN D (ERGOCALCIFEROL) 1.25 MG (50000 UNIT) PO CAPS
50000.0000 [IU] | ORAL_CAPSULE | ORAL | 0 refills | Status: DC
  Filled 2024-05-08: qty 8, 56d supply, fill #0

## 2024-05-08 NOTE — Progress Notes (Signed)
 PE Chaperone note: A chaperone was offered for the sensitive portions of the exam but the patient/family declined   NCCN Distress Questionnaire  Questionnaire Response Comments  Patient declines NCCN Distress Questionnaire:        Distress Thermometer Score (0-10) 0 - No distress   Comments:     Concern Response Comments  Physical  Concerns Pain    Emotional Concerns Worry or anxiety    Social Concerns None of the above    Practical Concerns Treatment decisions    Spiritual/Religious Concerns Death, dying or afterlife    Other Concerns     Resources Provided      Referrals Provided

## 2024-05-08 NOTE — Progress Notes (Signed)
 g  Breast Clinic New Patient Evaluation Medical Oncology  Identifying Statement: Chelsea Hansen [id number] is a 53 y.o. female from [place] [address] seen for metastatic breast cancer.  Physician Requesting Consultation: [name], [name] K, MD Patient Care Team: Unknown (Inactive) as PCP - General  Oncologic History: Oncology History Overview Note  Oncology History ([place]):   11/18/2020: palpable lump in right breast (hx of bx proven fibroadenoma);  Bilateral Diagnostic Mammogram:   1. Highly suspicious 3.2 cm mass associated with architectural   distortion and scattered calcifications in the UPPER OUTER QUADRANT   of the RIGHT breast at the 10 o'clock position 6 cm from the nipple,   corresponding to the palpable concern.   2. Indeterminate new 1.1 cm mass in the UPPER OUTER QUADRANT of the   RIGHT breast at the 9:30 o'clock position 8 cm from the nipple.   3. Solitary pathologic RIGHT axillary lymph node with eccentric   cortical thickening up to 7 mm.   4. Highly suspicious 0.8 cm mass involving the UPPER OUTER QUADRANT   of the LEFT breast.   5. No pathologic LEFT axillary lymphadenopathy    12/03/2020 Initial Diagnosis - Bilateral breast biopsies   Right breast: cT2 N1, stage IIa invasive lobular carcinoma, grade 2, ER/PR strongly positive, HER2 not amplified, with an MIB-1 of 40%  (a) the biopsied right axillary lymph node was positive with extracapsular extension  (b) a second right breast mass also biopsied was a fibroadenoma, concordant   MAMMAPRINT tested on biopsy returned high risk, luminal type B, indicating significant benefit from chemo    12/25/2023: [facility] BRCAplus panel (report date 12/24/2020) or the CancerNext-Expanded + RNAinsight panel (report date 12/31/2020). 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    01/27/2021-03/24/2021: NACT - Doxorubicin /Cyclophosphamide  x 4, weekly Taxol  x 11.   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 Multifocal sclerotic bony metastatic disease greatest in the  thoracic spine with scattered rib lesions and sclerosis as well.  2. No nodal or visceral metastasis about the chest.  3. Collateral pathways about the chest suggest some subclavian  venous narrowing on the LEFT. This could also be related to arm  position. Correlate clinically.  4. Signs of RIGHT breast lumpectomy. Potential postoperative changes  in the LEFT breast with small focal area of nodularity in the  lateral LEFT breast which could also be postoperative, correlate  clinically and consider mammographic correlation as warranted.  5. Mild cardiac enlargement.    05/18/2022: Anastrozole  + Verzenio  100mg  BID started    06/06/2022: MRI L Spine:  1. Skeletal metastatic disease which is likely treated/quiescent -   including an L3 pathologic vertebral body fracture with mild   comminution and enhancement, but little if any marrow edema and did   NOT appear hypermetabolic on recent PET-CT.   2. No lumbar dural or intrathecal metastases. And capacious spinal   canal with no spinal stenosis despite some degeneration.  06/23/2022-07/07/2022: Palliative RT L spine   06/10/23: PET/CT: Interval development of hypermetabolic lymphadenopathy left submandibular region, throughout mediastinal, bilateral hilar, upper abdomen (no uptake in the bones)    12/22/2023: CT CAP: Bilateral pulmonary micronodules, persistent vague fat  stranding along the proximal pancreas with query underlying mass. Recommend MRI pancreatic protocol. Scattered sclerotic osseous lesions.   12/23/2023: MRI Abdomen MRCP:   1. Subtly expansile, masslike appearance of the pancreatic head and   uncinate as seen by prior CT, measuring 2.5 x 2.3 cm. This   appearance is new in the interval to prior restaging PET-CT dated   06/10/2023, and is highly concerning for pancreatic adenocarcinoma.   2. Common bile duct at the upper limit of normal in caliber   measuring up to 0.7 cm. No gallstones.   3. Prominence of the pancreatic duct measuring up to 0.4 cm.   4. Heterogeneously enhancing vertebral metastases, in keeping with   known metastatic breast cancer.   5. Pancolonic diverticulosis.    02/02/24: CT abdomen and pelvis: Pancreatic mass not well visualized, progressive extrahepatic biliary ductal dilation, right hydronephrosis, peritoneal carcinomatosis, bone metastases L3   02/03/2024: Peritoneal biopsy: Metastatic breast cancer, ER 100%, PR 0%, HER2 2+    02/16/2024: EnHerTu  started   03/04/2024-03/09/2024: Hospitalized for Abdominal wall abscess   03/05/2024: CT abdomen and pelvis: Improved abscess 1.8 cm, dilation of extrahepatic bile duct, omental infiltration with peritoneal carcinomatosis, diffuse bone metastases    Multiple hospital admissions for abdominal pain, bowel obstructions, G tube leak, most recently hypocalcemia, also hypomagnemesia, hypokalemia. Initiated hospice care in 03/2024.  05/08/2024: Suspended hospice services for second opinion at [facility].    Breast cancer metastasized to bone, right (CMS/HHS-HCC)  05/20/2022 Initial Diagnosis   Breast cancer metastasized to bone, right (CMS/HHS-HCC)    History of Present Illness:  Chelsea Hansen is a 53 y.o. woman who presents for second opinion for metastatic ER+ breast cancer. PMHx is notable for cancer-related recurrent bowel obstructions requiring G-tube and recurrent  hospitalizations for G-tube site leaks, poor PO intake, nausea/vomiting, electrolyte derangements and septic shock.   Please see oncology history above for complete presenting details. Briefly, patient was diagnosed with right breast cancer in 2022, cT2 cN1 ILC, G2, ER/PR+ HER-, with mammaprint showing high risk luminal B type disease. She underwent neoadjuvant chemotherapy with AC-T (01/2021) followed by lumpectomy, which showed extensive residual disease, ypT2 ypN1a and left lumpectomy with HG DCIS. She underwent adjuvant XRT and was started on AET. Only a few months later, 04/2022, she was discovered to have bone mets on a CT for chest wall pain and abemaciclib  was added to her AI. She had palliative RT to L spine. On record review, she subsequently had evidence of disease progression in late 2024 involving lymph nodes, abdomen and pancreas. She had a peritoneal biopsy that confirmed metastatic breast cancer with ER 100% PR 0% and HER2 2+. Enhertu  was started. She subsequently had several hospitalizations for abdominal pain, bowel obstructions, G-tube leaks, electrolyte derangements. Goals of care were discussed and she was initiated on hospice services. She had negative germline genetics with a VUS in ATM p.D44G.  Patient presents today with her niece. She reports she is interested in treatment. She experiences fluctuating energy levels, with some days being better than others. She lives with her daughter in [place] and is able to walk and perform some daily activities, though she has limitations due to fatigue and stomach pain. A G-tube was placed in 11/2023 due to an  obstruction, which has been problematic, frequently coming out and causing discomfort. Her current medications include a fentanyl  patch and oxycodone  for pain management, primarily for stomach pain. No current fevers, chills, chest pain, shortness of breath, nausea, vomiting, diarrhea, constipation, numbness, tingling, headaches, or vision  changes. She reports a recent weight loss of 17 pounds over the past month, attributed to decreased appetite and occasional nausea.  Past Medical History: Past Medical History:  Diagnosis Date  . Anxiety   . Asthma, unspecified asthma severity, unspecified whether complicated, unspecified whether persistent (HHS-HCC)   . History of cancer     Past Surgical History: Past Surgical History:  Procedure Laterality Date  . LAPAROSCOPIC CREATION GASTRJEJONOSTOMY  01/02/2024   Dr Lyndel  . laparoscopic insertion of gastrostomy tube, laparoscopic insertion of jejunostomy tube, upper endoscopy  01/10/2024   Dr Stevie  .  lap creation of gastrojejunostomy     . HYSTERECTOMY     Family History: Family History  Problem Relation Age of Onset  . Breast cancer Mother     Social History: Social History   Socioeconomic History  . Marital status: Single  Tobacco Use  . Smoking status: Former    Types: Cigarettes  . Smokeless tobacco: Never  Substance and Sexual Activity  . Alcohol use: Never  . Drug use: Never   Social Drivers of Corporate Investment Banker Strain: High Risk (02/13/2024)   Received from [facility]   Overall Financial Resource Strain (CARDIA)   . How hard is it for you to pay for the very basics like food, housing, medical care, and heating?: Hard  Food Insecurity: No Food Insecurity (05/07/2024)   Received from [facility]   Hunger Vital Sign   . Within the past 12 months, you worried that your food would run out before you got the money to buy more.: Never true   . Within the past 12 months, the food you bought just didn't last and you didn't have money to get more.: Never true  Recent Concern: Food Insecurity - Food Insecurity Present (02/16/2024)   Received from [facility]   Hunger Vital Sign   . Within the past 12 months, you worried that your food would run out before you got the money to buy more.: Sometimes true   . Within the past 12 months, the food  you bought just didn't last and you didn't have money to get more.: Sometimes true  Transportation Needs: No Transportation Needs (05/07/2024)   Received from [facility] - Transportation   . In the past 12 months, has lack of transportation kept you from medical appointments or from getting medications?: No   . In the past 12 months, has lack of transportation kept you from meetings, work, or from getting things needed for daily living?: No  Physical Activity: Inactive (02/13/2024)   Received from [facility]   Exercise Vital Sign   . On average, how many days per week do you engage in moderate to strenuous exercise (like a brisk walk)?: 0 days   . On average, how many minutes do you engage in exercise at this level?: 0 min  Stress: No Stress Concern Present (02/13/2024)   Received from [facility] - Occupational Stress Questionnaire   . Do you feel stress - tense, restless, nervous, or anxious, or unable to sleep at night because your mind is troubled all the time - these days?: Not at all  Social Connections: Moderately Isolated (04/01/2024)   Received from [facility]   Social Connection and Isolation Panel   . In a typical week, how many times do you talk on the phone with family, friends, or neighbors?: More than three times a week   . How often do you get together with friends or relatives?: Once a week   . How often do you attend church or religious services?: 1 to 4 times per year   . Do you belong to any clubs or organizations such as church groups, unions, fraternal or athletic groups, or school groups?: No   . How often do you attend meetings of the clubs or organizations you belong to?: Never   . Are you married, widowed, divorced, separated, never married, or living with a partner?: Never married    Psycho-Social History: Social History   Socioeconomic History  . Marital status: Single  Tobacco Use  . Smoking status:  Former    Types: Cigarettes  . Smokeless tobacco: Never  Substance and Sexual Activity  . Alcohol use: Never  . Drug use: Never   Social Drivers of Corporate Investment Banker Strain: High Risk (02/13/2024)   Received from [facility]   Overall Financial Resource Strain (CARDIA)   . How hard is it for you to pay for the very basics like food, housing, medical care, and heating?: Hard  Food Insecurity: No Food Insecurity (05/07/2024)   Received from [facility]   Hunger Vital Sign   . Within the past 12 months, you worried that your food would run out before you got the money to buy more.: Never true   . Within the past 12 months, the food you bought just didn't last and you didn't have money to get more.: Never true  Recent Concern: Food Insecurity - Food Insecurity Present (02/16/2024)   Received from [facility]   Hunger Vital Sign   . Within the past 12 months, you worried that your food would run out before you got the money to buy more.: Sometimes true   . Within the past 12 months, the food you bought just didn't last and you didn't have money to get more.: Sometimes true  Transportation Needs: No Transportation Needs (05/07/2024)   Received from [facility] - Transportation   . In the past 12 months, has lack of transportation kept you from medical appointments or from getting medications?: No   . In the past 12 months, has lack of transportation kept you from meetings, work, or from getting things needed for daily living?: No  Physical Activity: Inactive (02/13/2024)   Received from [facility]   Exercise Vital Sign   . On average, how many days per week do you engage in moderate to strenuous exercise (like a brisk walk)?: 0 days   . On average, how many minutes do you engage in exercise at this level?: 0 min  Stress: No Stress Concern Present (02/13/2024)   Received from [facility] - Occupational Stress Questionnaire   .  Do you feel stress - tense, restless, nervous, or anxious, or unable to sleep at night because your mind is troubled all the time - these days?: Not at all  Social Connections: Moderately Isolated (04/01/2024)   Received from [facility]   Social Connection and Isolation Panel   . In a typical week, how many times do you talk on the phone with family, friends,  or neighbors?: More than three times a week   . How often do you get together with friends or relatives?: Once a week   . How often do you attend church or religious services?: 1 to 4 times per year   . Do you belong to any clubs or organizations such as church groups, unions, fraternal or athletic groups, or school groups?: No   . How often do you attend meetings of the clubs or organizations you belong to?: Never   . Are you married, widowed, divorced, separated, never married, or living with a partner?: Never married   Allergies: Allergies  Allergen Reactions  . Denosumab  Other (See Comments)    Severe prolonged hypocalcemia needing hospitalization    Current Medications: Current Outpatient Medications  Medication Sig Dispense Refill  . albuterol  90 mcg/actuation inhaler TAKE 2 PUFFS BY MOUTH EVERY 6 HOURS AS NEEDED FOR WHEEZE OR SHORTNESS OF BREATH    . fentaNYL  (DURAGESIC ) 100 mcg/hr patch Place 1 patch onto the skin    . hydrocortisone (CORTEF) 10 MG tablet     . lidocaine -prilocaine  (EMLA ) cream     . omeprazole  (PRILOSEC) 40 MG DR capsule     . ondansetron  (ZOFRAN -ODT) 4 MG disintegrating tablet Take 4 mg by mouth    . oxyCODONE -acetaminophen  (PERCOCET) 7.5-325 mg tablet Take 15 mg of oxycodone  by mouth every 4 (four) hours as needed    . polyethylene glycol (MIRALAX ) powder Take 17 g by mouth    . amLODIPine  (NORVASC ) 10 MG tablet Take 1 tablet by mouth once daily (Patient not taking: Reported on 05/08/2024)    . atorvastatin  (LIPITOR) 40 MG tablet Take 40 mg by mouth once daily (Patient not taking: Reported on 05/08/2024)     . benzonatate  (TESSALON ) 200 MG capsule TAKE 1 CAPSULE BY MOUTH 3 TIMES A DAY FOR 7 DAYS (Patient not taking: Reported on 05/08/2024)    . calcitRIOL  (ROCALTROL ) 0.5 MCG capsule Take 0.5 mcg by mouth (Patient not taking: Reported on 05/08/2024)    . doxycycline  (VIBRA -TABS) 100 MG tablet Take 1 tablet by mouth 2 (two) times daily (Patient not taking: Reported on 05/08/2024)    . ergocalciferol , vitamin D2, (VITAMIN D2) 1,250 mcg (50,000 unit) capsule Take 1 capsule (50,000 Units total) by mouth once a week for 8 doses 8 capsule 0  . escitalopram  oxalate (LEXAPRO ) 20 MG tablet  (Patient not taking: Reported on 05/08/2024)    . fluticasone  propion-salmeteroL (ADVAIR  HFA) 115-21 mcg/actuation inhaler Inhale into the lungs (Patient not taking: Reported on 05/08/2024)    . fluticasone  propionate (FLONASE ) 50 mcg/actuation nasal spray Place into one nostril (Patient not taking: Reported on 05/08/2024)    . hydroCHLOROthiazide  (HYDRODIURIL ) 25 MG tablet Take 1 tablet by mouth once daily (Patient not taking: Reported on 05/08/2024)    . loratadine  (CLARITIN ) 10 mg tablet Take 1 tablet by mouth once daily (Patient not taking: Reported on 05/08/2024)    . magnesium  oxide (MAG-OX) 400 mg (241.3 mg magnesium ) tablet Take 1 tablet (400 mg total) by mouth once daily 5 tablet 0  . montelukast  (SINGULAIR ) 10 mg tablet Take by mouth (Patient not taking: Reported on 05/08/2024)    . pantoprazole  (PROTONIX ) 40 MG DR tablet Take 40 mg by mouth 2 (two) times daily (Patient not taking: Reported on 05/08/2024)    . sennosides-docusate (SENOKOT-S) 8.6-50 mg tablet Take 1 tablet by mouth once daily (Patient not taking: Reported on 05/08/2024)    . sulfamethoxazole -trimethoprim  (BACTRIM  DS) 800-160 mg tablet Take  1 tablet by mouth 2 (two) times daily (Patient not taking: Reported on 05/08/2024)     No current facility-administered medications for this visit.    Objective:    Physical Exam: BP 93/69 (BP Location: Left  upper arm, Patient Position: Sitting, BP Cuff Size: Small Adult)   Pulse 88   Temp 36.9 C (98.4 F) (Oral)   Resp 16   Ht 173 cm (5' 8.11)   Wt 68.1 kg (150 lb 2.1 oz)   SpO2 98%   BMI 22.75 kg/m Body surface area is 1.81 meters squared.  ECOG Performance Status: 1-2 General appearance: Chronically ill-appearing, comfortable.  HEENT: EOMI, sclerae anicteric.  Lymph Nodes: No cervical, supraclavicular or axillary lymphadenopathy. Cardiac: RRR, without murmur, rub, or gallop. +R chest port. Lungs: Breath sounds are clear to auscultation. Abdomen/GI: LUQ G-tube present. LLQ wound with ostomy bag, green drainage noted in bag.  Extremities: No clubbing, cyanosis. Mild ankle edema noted. Skin: Color and texture normal. No rashes or lesions.  Neurologic: Alert and oriented x 3.  Chaperone offered for the sensitive portions of the exam, the patient declined.   Laboratory: CBC with anemia to Hgb 8.7 g/dL WBC 4.9 PLT 816 CMP with K to 3.1, Cr 0.3, Calcium  5.6 (corrected 6.4), Mag 1.6, Phos 2.0, AP 181, Albumin  3.0.  Imaging: Reviewed outside imaging including CT AP.  IMPRESSION:  1. Mild right-sided hydroureteronephrosis without obstructing calculus identified.  2. Diffuse colonic diverticulosis without evidence of diverticulitis. Large amount of stool in the rectosigmoid colon.  3. Percutaneous gastrostomy tube in appropriate position without surrounding fluid collection; mild gastric wall thickening at the insertion site, likely reactive.  4. Diffuse sclerotic osseous metastatic disease again noted; stable chronic compression deformity of L3.  Imaging, labs and pathology personally reviewed and discussed with patient. Impression/Plan:  Chelsea Hansen is a 53 y.o. woman who presents for second opinion for metastatic ER+ breast cancer. PMHx is notable for cancer-related recurrent bowel obstructions requiring G-tube and recurrent hospitalizations for G-tube site leaks, poor PO intake,  nausea/vomiting, electrolyte derangements and septic shock.   # Metastatic breast cancer, ER+ HER2-low Reviewed patient's diagnosis, imaging, pathology, treatment history to date. Patient expresses interest in treatment of her breast cancer and has suspended her hospice services. We discussed my concern regarding her recurrent hospitalizations over the past year. I reviewed her treatment history with her local oncologist Dr. Odean who reports most recent treatment was Enhertu  on 02/16/24 and 03/14/24, dose-reduced and still leading to hospitalization for electrolyte derangements. We discussed my concern regarding cytotoxic chemotherapy, but her performance status appears somewhat improved since her recent hospitalization. Plan to obtain ctDNA Guardant testing to evaluate for ESR1 or AKT pathway mutations to consider PO targeted/endocrine therapy. Will repeat scans for baseline prior to treatment.  - Peripheral blood Guardant testing to evaluate for ESR1 or AKT pathway mutations - CT CAP and NM bone scan re-staging scans for baseline prior to therapy start  # Electrolyte derangements > Labs today notable for calcium  low to 5.6 (corrected for albumin  6.4), magnesium  low to 1.6, phos 2.0; asymptomatic - Discussed with patient, recommended calcium  2g/day and magnesium  oxide 400 mg/day, vitamin D  supplementation (last vitamin D  in chart 04/30/24 low to 9), sent to local pharmacy - Re-check with local provider. If becomes symptomatic, recommend ED referral.  FOLLOW-UP: Pending work-up as above.   Attending Attestation:  I personally performed the service. (TP)  I spent 80 minutes with Chelsea Hansen and this includes face to face  time and non-face to face time preparing to see the patient (eg, review of tests), obtaining and/or reviewing separately obtained history, independently interpreting results (not separately reported), communicating results to the patient/family/caregiver, and Care coordination (not  separately reported). More than 50% of time was spent in face to face care of patient.  All questions were answered to the best of my ability.   Kandyce Archer, MD Breast Oncology, [facility]

## 2024-05-09 ENCOUNTER — Other Ambulatory Visit: Payer: MEDICAID

## 2024-05-09 ENCOUNTER — Ambulatory Visit: Payer: MEDICAID | Admitting: Hematology and Oncology

## 2024-05-09 ENCOUNTER — Ambulatory Visit: Payer: MEDICAID

## 2024-05-10 ENCOUNTER — Other Ambulatory Visit (HOSPITAL_COMMUNITY): Payer: Self-pay

## 2024-05-10 ENCOUNTER — Other Ambulatory Visit: Payer: Self-pay | Admitting: Nurse Practitioner

## 2024-05-10 ENCOUNTER — Telehealth: Payer: Self-pay

## 2024-05-10 ENCOUNTER — Other Ambulatory Visit: Payer: Self-pay

## 2024-05-10 MED ORDER — OXYCODONE HCL 15 MG PO TABS
15.0000 mg | ORAL_TABLET | ORAL | 0 refills | Status: DC | PRN
  Filled 2024-05-10: qty 84, 14d supply, fill #0

## 2024-05-10 NOTE — Telephone Encounter (Signed)
 Pt called requesting a refill of pain medication. Pt is now established with [facility], pt educated that we cannot fill medication and [facility] should be able to fill it for er. Pt verbalized understanding, no further needs at this time.

## 2024-05-11 ENCOUNTER — Encounter: Payer: Self-pay | Admitting: Hematology and Oncology

## 2024-05-11 ENCOUNTER — Other Ambulatory Visit (HOSPITAL_COMMUNITY): Payer: Self-pay

## 2024-05-11 ENCOUNTER — Telehealth (INDEPENDENT_AMBULATORY_CARE_PROVIDER_SITE_OTHER): Payer: Self-pay

## 2024-05-11 ENCOUNTER — Encounter: Payer: Self-pay | Admitting: General Practice

## 2024-05-11 NOTE — Progress Notes (Signed)
 [facility] Spiritual Care Note  Reached Chelsea Hansen by phone per her request for follow-up support. She was resting, so we plan to follow up next week instead.  [address] [name], [place], [facility] Pager [phone] Voicemail [phone]

## 2024-05-11 NOTE — Telephone Encounter (Signed)
 Copied from CRM #[id number]. Topic: Appointments - Appointment Scheduling >> May 10, 2024 12:26 PM Myrick T wrote: Patient/patient representative is calling to schedule an appointment. Refer to attachments for appointment information. Patient said she needs to be seen by Monday 11/24 as she just got out the hospital on 11/14, she needs labs done and she needs medication refills. Please f/u with patient with an appt

## 2024-05-14 ENCOUNTER — Encounter: Payer: Self-pay | Admitting: General Practice

## 2024-05-14 ENCOUNTER — Other Ambulatory Visit (HOSPITAL_COMMUNITY): Payer: Self-pay | Admitting: Interventional Radiology

## 2024-05-14 ENCOUNTER — Encounter: Payer: Self-pay | Admitting: Hematology and Oncology

## 2024-05-14 ENCOUNTER — Other Ambulatory Visit: Payer: Self-pay

## 2024-05-14 ENCOUNTER — Ambulatory Visit: Payer: MEDICAID | Admitting: Physical Therapy

## 2024-05-14 ENCOUNTER — Ambulatory Visit (HOSPITAL_COMMUNITY)
Admission: RE | Admit: 2024-05-14 | Discharge: 2024-05-14 | Disposition: A | Discharge status: Home / Self Care | Payer: MEDICAID | Source: Ambulatory Visit | Attending: Interventional Radiology | Admitting: Interventional Radiology

## 2024-05-14 ENCOUNTER — Other Ambulatory Visit (HOSPITAL_COMMUNITY): Payer: Self-pay

## 2024-05-14 DIAGNOSIS — Z431 Encounter for attention to gastrostomy: Secondary | ICD-10-CM | POA: Insufficient documentation

## 2024-05-14 DIAGNOSIS — R1310 Dysphagia, unspecified: Secondary | ICD-10-CM | POA: Insufficient documentation

## 2024-05-14 HISTORY — PX: IR REPLC GASTRO/COLONIC TUBE PERCUT W/FLUORO: IMG2333

## 2024-05-14 MED ORDER — IOHEXOL 300 MG/ML  SOLN
50.0000 mL | Freq: Once | INTRAMUSCULAR | Status: AC | PRN
  Administered 2024-05-14: 6 mL

## 2024-05-14 MED ORDER — OXYCONTIN 80 MG PO T12A
80.0000 mg | EXTENDED_RELEASE_TABLET | Freq: Two times a day (BID) | ORAL | 0 refills | Status: DC
  Filled 2024-05-14 – 2024-05-22 (×2): qty 60, 30d supply, fill #0

## 2024-05-14 MED ORDER — DULOXETINE HCL 30 MG PO CPEP
30.0000 mg | ORAL_CAPSULE | Freq: Every day | ORAL | 1 refills | Status: DC
  Filled 2024-05-14: qty 30, 30d supply, fill #0

## 2024-05-14 NOTE — Progress Notes (Signed)
 SPIRITUAL CARE AND COUNSELING CONSULT NOTE   VISIT SUMMARY Chelsea Hansen welcomes continued support as her care plan unfolds. Reached her by phone for brief spiritual and emotional check-in. She is excited to be traveling to her aunt's house in [place] for Thanksgiving, which has been an important meaning-making goal for her.   SPIRITUAL ENCOUNTER                                                                                                                                                                      Type of Visit: Follow up Care provided to:: Patient Referral source: Patient request Reason for visit: Routine spiritual support   SPIRITUAL FRAMEWORK  Presenting Themes: Goals in life/care Community/Connection: Family Patient Stress Factors: Health changes, Loss of control   GOALS   Clinical Care Goals: Provide continued availability for support   INTERVENTIONS   Spiritual Care Interventions Made: Compassionate presence, Reflective listening, Normalization of emotions, Meaning making    INTERVENTION OUTCOMES   Outcomes: Connection to values and goals of care, Reduced isolation  SPIRITUAL CARE PLAN   Spiritual Care Issues Still Outstanding: Chaplain will continue to follow Follow up plan : Plan short, frequent check-ins, as those work best for patient's physical and emotional needs    Chelsea Hansen, [facility] Pager [phone] Voicemail [phone]

## 2024-05-14 NOTE — Therapy (Signed)
 OUTPATIENT PHYSICAL THERAPY ONCOLOGY EVALUATION  Patient Name: Chelsea Hansen MRN: [id number] DOB:[birth year], 53 y.o., female Today's Date: 05/15/2024  END OF SESSION:  PT End of Session - 05/15/24 1015     Visit Number 1    Number of Visits 6    Date for Recertification  06/26/24    Authorization Type [employer]    PT Start Time 1015   late   PT Stop Time 1100    PT Time Calculation (min) 45 min    Activity Tolerance Patient limited by fatigue    Behavior During Therapy [facility] for tasks assessed/performed          Past Medical History:  Diagnosis Date   Allergy    seasonal allergies   Anxiety    on meds   Asthma    uses inhaler   Breast cancer (HCC) 2012   RIGHT lumpectomy   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 Lynwood Nasuti   Hyperlipidemia    on meds   Hypertension    on meds   Peritoneal carcinomatosis (HCC)    S/P percutaneous endoscopic gastrostomy (PEG) tube placement (HCC)    SVT (supraventricular tachycardia)    Past Surgical History:  Procedure Laterality Date   ABDOMINAL HYSTERECTOMY     AXILLARY SENTINEL NODE BIOPSY Left 07/09/2021   Procedure: LEFT AXILLARY SENTINEL NODE BIOPSY;  Surgeon: [name], MD;  Location: [facility];  Service: General;  Laterality: Left;   BALLOON DILATION N/A 12/13/2023   Procedure: MERRILL HODGKIN;  Surgeon: Federico Rosario BROCKS, MD;  Location[facility] ENDOSCOPY;  Service: Gastroenterology;  Laterality: N/A;   BIOPSY   06/29/2022   Procedure: BIOPSY;  Surgeon: Legrand Victory LITTIE DOUGLAS, MD;  Location: [facility] ENDOSCOPY;  Service: Gastroenterology;;   BIOPSY OF SKIN SUBCUTANEOUS TISSUE AND/OR MUCOUS MEMBRANE  12/13/2023   Procedure: BIOPSY, SKIN, SUBCUTANEOUS TISSUE, OR MUCOUS MEMBRANE;  Surgeon: Federico Rosario BROCKS, MD;  Location: [facility] ENDOSCOPY;  Service: Gastroenterology;;   BREAST EXCISIONAL BIOPSY Right 09/16/2009   BREAST LUMPECTOMY WITH RADIOACTIVE SEED LOCALIZATION Bilateral 07/09/2021   Procedure: BILATERAL BREAST LUMPECTOMY WITH RADIOACTIVE SEED LOCALIZATION;  Surgeon: [name], MD;  Location: [facility];  Service: General;  Laterality: Bilateral;   BREAST SURGERY     lumpectomy   ESOPHAGOGASTRODUODENOSCOPY N/A 12/13/2023   Procedure: EGD (ESOPHAGOGASTRODUODENOSCOPY);  Surgeon: Federico Rosario BROCKS, MD;  Location[facility] END[facility]COPY;  Service: Gastroenterology;  Laterality: N/A;   ESOPHAGOGASTRODUODENOSCOPY N/A 12/26/2023   Procedure: EGD (ESOPHAGOGASTRODUODENOSCOPY);  Surgeon: Wilhelmenia Aloha Raddle., MD;  Location: [facility];  Service: Gastroenterology;  Laterality: N/A;   ESOPHAGOGASTRODUODENOSCOPY N/A 12/27/2023   Procedure: EGD (ESOPHAGOGASTRODUODENOSCOPY);  Surgeon: Wilhelmenia Aloha Raddle., MD;  Location: [facility];  Service: Gastroenterology;  Laterality: N/A;   ESOPHAGOGASTRODUODENOSCOPY N/A 03/08/2024   Procedure: EGD (ESOPHAGOGASTRODUODENOSCOPY);  Surgeon: Wilhelmenia Aloha Raddle., MD;  Location: [facility];  Service: Gastroenterology;  Laterality: N/A;   ESOPHAGOGASTRODUODENOSCOPY (EGD) WITH PROPOFOL  N/A 06/29/2022  Procedure: ESOPHAGOGASTRODUODENOSCOPY (EGD) WITH PROPOFOL ;  Surgeon: Legrand Victory LITTIE DOUGLAS, MD;  Location: [facility];  Service: Gastroenterology;  Laterality: N/A;   EUS N/A 12/26/2023   Procedure: ULTRASOUND, UPPER GI TRACT, ENDOSCOPIC;  Surgeon: Wilhelmenia Aloha Raddle., MD;  Location: [facility];  Service: Gastroenterology;  Laterality: N/A;   EUS N/A 12/27/2023   Procedure:  ULTRASOUND, UPPER GI TRACT, ENDOSCOPIC;  Surgeon: Wilhelmenia Aloha Raddle., MD;  Location: [facility];  Service: Gastroenterology;  Laterality: N/A;   EUS N/A 03/08/2024   Procedure: ULTRASOUND, UPPER GI TRACT, ENDOSCOPIC;  Surgeon: Wilhelmenia Aloha Raddle., MD;  Location: [facility];  Service: Gastroenterology;  Laterality: N/A;   IR FLUORO PROCEDURE UNLISTED  01/20/2024   IR IMAGING GUIDED PORT INSERTION  02/06/2024   IR REPLC DUODEN/JEJUNO TUBE PERCUT W/FLUORO  02/03/2024   IR REPLC GASTRO/COLONIC TUBE PERCUT W/FLUORO  01/20/2024   IR REPLC GASTRO/COLONIC TUBE PERCUT W/FLUORO  04/12/2024   IR REPLC GASTRO/COLONIC TUBE PERCUT W/FLUORO  05/14/2024   LAPAROSCOPIC INSERTION GASTROSTOMY TUBE N/A 01/10/2024   Procedure: laparoscopic insertion of gastrostomy tube, laparoscopic insertion of jejunostomy tube, upper endoscopy;  Surgeon: Kinsinger, Herlene Righter, MD;  Location: [facility];  Service: General;  Laterality: N/A;  Laproscopic insertion   LAPAROSCOPIC ROUX-EN-Y GASTRIC BYPASS WITH HIATAL HERNIA REPAIR N/A 01/02/2024   Procedure: LAPAROSCOPIC CREATION GASTRJEJONOSTOMY;  Surgeon: Lyndel Deward PARAS, MD;  Location: [facility];  Service: General;  Laterality: N/A;  GASTROJEJUNOSTOMY   PORT-A-CATH REMOVAL Left 07/09/2021   Procedure: REMOVAL PORT-A-CATH;  Surgeon: [name], MD;  Location: [facility];  Service: General;  Laterality: Left;   PORTACATH PLACEMENT Left 01/26/2021   Procedure: INSERTION PORT-A-CATH;  Surgeon: [name], MD;  Location: [facility];  Service: General;  Laterality: Left;   RADIOACTIVE SEED GUIDED AXILLARY SENTINEL LYMPH NODE Right 07/09/2021   Procedure: RADIOACTIVE SEED GUIDED RIGHT AXILLARY SENTINEL LYMPH NODE DISSECTION;  Surgeon: [name], MD;  Location: [facility];  Service: General;  Laterality: Right;   TUBAL LIGATION     Patient Active Problem List   Diagnosis Date Noted   Sepsis (HCC) 04/30/2024   Pressure injury of skin  04/30/2024   Cellulitis 04/29/2024   Cancer associated pain 04/12/2024   Medication management 04/11/2024   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   Need for emotional support 04/10/2024   Bacteremia due to Klebsiella pneumoniae 04/10/2024   DNR (do not resuscitate) discussion 04/10/2024   Counseling and coordination of care 04/10/2024   Goals of care, counseling/discussion 04/10/2024   Palliative care encounter 04/10/2024   Septic shock (HCC) 04/09/2024   Malnutrition of moderate degree 04/04/2024   Fall 04/01/2024   Neonatal cows' milk hypocalcemia 03/20/2024   Vomiting and diarrhea 03/19/2024   H/O bypass gastrojejunostomy 03/08/2024   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   Common bile duct dilation 03/07/2024   Biliary obstruction (HCC) 03/07/2024   Abnormal LFTs 03/07/2024   Chronic anemia 03/07/2024   Gastrostomy tube present (HCC) 02/10/2024   Irritant contact dermatitis associated with digestive stoma 02/10/2024   Protein-calorie malnutrition, moderate 02/03/2024   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 02/01/2024   Generalized abdominal pain 01/17/2024   Elevated CA 19-9 level 01/16/2024   Hypophosphatemia 01/04/2024   Hypomagnesemia 01/04/2024   Pancreatic lesion 12/29/2023   Unintentional weight loss 12/29/2023   N&V (nausea and vomiting) 12/29/2023   Duodenal obstruction 12/29/2023   Abnormal MRI of abdomen 12/26/2023  Abnormal CT of the abdomen 12/23/2023   Elevated liver enzymes 12/23/2023   Acute kidney injury 12/23/2023   AKI (acute kidney injury) 12/22/2023   Elevated LFTs 12/22/2023   Pancreatic mass 12/22/2023   Right sided weakness 12/22/2023   Hypotension 12/22/2023   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   Abnormal CT scan, colon 12/14/2023   Acute esophagitis 12/14/2023   Candida esophagitis (HCC) 12/14/2023   Therapeutic  opioid-induced constipation (OIC) 12/11/2023   Hypocalcemia 12/10/2023   Fever of unknown origin 12/10/2023   Sinus tachycardia 12/10/2023   Acute kidney injury superimposed on chronic kidney disease 12/05/2023   Electrolyte abnormality 12/05/2023   Dehydration 12/05/2023   QT prolongation 12/05/2023   Intractable nausea and vomiting 11/24/2023   Neoplasm related pain 12/07/2022   Constipation 06/30/2022   Nausea and vomiting in adult 06/29/2022   Hematemesis 06/28/2022   Breast cancer metastasized to bone, right (HCC) 05/20/2022   Supraventricular tachycardia 07/08/2021   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   GERD (gastroesophageal reflux disease) 05/27/2021   HLD (hyperlipidemia) 05/27/2021   Essential hypertension 05/27/2021   Pain management contract signed 05/04/2021   Chronic pain syndrome (breast cancer) 05/04/2021   Palpitations 03/25/2021   Sinus bradycardia 03/25/2021   Daytime somnolence 03/25/2021   Snoring 03/25/2021   Obesity (BMI 30-39.9) 03/25/2021   Port-A-Cath in place 01/27/2021   Genetic testing 12/24/2020   Family history of uterine cancer 12/17/2020   Malignant neoplasm of upper-outer quadrant of right breast in female, estrogen receptor positive (HCC) 12/12/2020   Breast cancer (HCC) 12/12/2020   Hypokalemia 01/02/2012   Chronic asthma 01/01/2012   Bradycardia 01/01/2012   Syncope 12/31/2011   Mediastinal adenopathy 12/31/2011   Pancytopenia (HCC) 12/31/2011   Chest pain 12/31/2011    PCP:   REFERRING PROVIDER: Dr. Cheryle Page  REFERRING DIAG: Cancer related pain  THERAPY DIAG:  Neoplasm related pain  Neoplasm related pain (acute) (chronic)  Metastasis to bone (HCC)  Muscle weakness (generalized)  Metastasis to peritoneum [facility])  ONSET DATE: 03/10/2024  Rationale for Evaluation and Treatment: Rehabilitation  SUBJECTIVE:                                                                                                                                                                                            SUBJECTIVE STATEMENT:  [facility] stopped treatment Sept 20, but she is going for a second opinion at [place]. Sometimes depending on the day she needs help with ambulating to bathroom, AD or a person(son or daughter), sometimes needs help with ADL's dressing, bathing, sometimes she is able to drive. Sometimes has trouble with balance. A lot depends  on how she feels. She takes significant amount of pain medicine that does help with the pain and does not make her sleepy. She has had over 10 hospitalizations from June to present. Uses hands getting up and down from chairs, getting up and down from bed, toilet.   PERTINENT HISTORY:  Neoadjuvant chemotherapy with Doxorubicin  and Cyclophosphamide  given x 4 beginning 01/27/2021 and completing on 03/10/2021 followed by weekly paclitaxel  x 12 beginning 05/2021 07/09/2021: Right lumpectomy: Grade 2 invasive lobular cancer, 2.5 cm, LCIS, margins negative, LCIS focally at anterior margin, 1/1 lymph node positive, ER 95%, PR 100%, HER2 negative, Ki-67 40% Left lumpectomy: High-grade DCIS: 0.7 cm, margins negative, 0/1 lymph node negative ER 95%, PR 100%. Pt now  STAGE IV Breast Cancer with bone mets throughout thoracic and lumbar regions and is s/p radiation to her lumbar spine.  Pt also with METS to peritoneam and small intestine and is s/p duodenal obstruction. She was recently hospitalized 04/29/2024-05/04/2024 for septic xhock due to G tube leaking.  She is mostly trying to take in food by mouth. She has severe hypocalcemia, and hemoglobin was 8 when she left hospital.Pt. sees Palliative Care for chronic pain management and recently revoked home hospice so she could have a second opinion.  She was recommended for HHPT but prefers to have outpt. Pt is trying to get a second opinion from [facility] and is waiting to hear about blood work first.  PAIN:  Are you having pain? Yes NPRS scale: 8/10, has not  had pain meds/breakfast  yet Pain location: stomach, LB Pain orientation: Bilateral  PAIN TYPE: aching and throbbing Pain description: constant  Aggravating factors: if she does too much Relieving factors: pain meds  PRECAUTIONS: Multiple hospitalizations, bilateral breast cancer with bone mets and peritoneal METS,bilateral UE  lymphedema risk, colostomy, PEG  WEIGHT BEARING RESTRICTIONS: No  FALLS:  Has patient fallen in last 6 months? No  LIVING ENVIRONMENT: Lives with: lives with their daughter Lives in: House/apartment Stairs: Yes; External: 7 steps; can reach both, internal 15 steps Has following equipment at home: grab bars, shower bench  OCCUPATION: not working  LEISURE:   HAND DOMINANCE: right   PRIOR LEVEL OF FUNCTION: Independent with basic ADLs, Needs assistance with ADLs, and meeds assitance with gait at times  PATIENT GOALS: Get strength back   OBJECTIVE: Note: Objective measures were completed at Evaluation unless otherwise noted.  COGNITION: Overall cognitive status: Within functional limits for tasks assessed   PALPATION:   OBSERVATIONS / OTHER ASSESSMENTS:   SENSATION: Light touch: Deficits numbness intermittent in feet,   POSTURE: forward head rounded shoulders  UPPER EXTREMITY AROM/PROM:  A/PROM RIGHT   eval   Shoulder extension   Shoulder flexion 105 with scaption  Shoulder abduction   Shoulder internal rotation   Shoulder external rotation     (Blank rows = not tested)  A/PROM LEFT   eval  Shoulder extension   Shoulder flexion 95 with some scaption  Shoulder abduction   Shoulder internal rotation   Shoulder external rotation     (Blank rows = not tested)  CERVICAL AROM: All within normal limits:    UPPER EXTREMITY STRENGTH: 2+=3-/5 flex and abd   LOWER EXTREMITY AROM/PROM:  A/PROM Right eval  Hip flexion 3-  Hip extension   Hip abduction   Hip adduction   Hip internal rotation   Hip external rotation   Knee  flexion   Knee extension 3  Ankle dorsiflexion 3-  Ankle plantarflexion 2+ with inversion  Ankle inversion   Ankle eversion    (Blank rows = not tested)  A/PROM LEFT eval  Hip flexion 2+, wobbly  Hip extension   Hip abduction   Hip adduction   Hip internal rotation   Hip external rotation   Knee flexion   Knee extension 3+  Ankle dorsiflexion 3-  Ankle plantarflexion 2+  Ankle inversion   Ankle eversion    (Blank rows = not tested)         LOWER EXTREMITY MMT: 2+ to 3- for tested muscles  LYMPHEDEMA ASSESSMENTS:    LYMPHEDEMA ASSESSMENTS:   LANDMARK RIGHT  eval  10 cm proximal to olecranon process   Olecranon process   10 cm proximal to ulnar styloid process   Just proximal to ulnar styloid process   Across hand at thumb web space   At base of 2nd digit   (Blank rows = not tested)  LANDMARK LEFT  eval  10 cm proximal to olecranon process   Olecranon process   10 cm proximal to ulnar styloid process   Just proximal to ulnar styloid process   Across hand at thumb web space   At base of 2nd digit   (Blank rows = not tested)  FUNCTIONAL TESTS: SIT TO STAND; Independent but with heavy use of UE's   TUG: 27.3 sec  1st rep, 23.59 second; heavy use of UE's on chair to stand  GAIT: Distance walked: 30 feet; short step length, mildly flexed posture Assistive device utilized: None Level of assistance: SBA Comments: slow gait with small steps, minimal trunk rotation, requires rest                                                                                                                                   TREATMENT DATE:  11/25/2025uc Attempted pulse oximeter but pts nails very long and despite multiple trials would not work Educated in [employer] including sitting heel and toe raises x 4-5 reps, seated LAQ alternating x 4-5 reps, seated alternate LE marching, Seated scapular retraction. Pt given frequent rest between exs. due to easy fatigue.   Printed handout for energy conservation techniques to consider using at home. Gave to pt and will discuss next visit. Discussed HHPT vs therapy here likely doing same types of activities. If it becomes too hard to get here may transition to HHPT.   PATIENT EDUCATION:  Access Code: [email or web address] URL: [email or web address] Date: 05/15/2024 Prepared by: Grayce Sheldon Education details: Exercises - Seated Long Arc Quad  - 1-2 x daily - 7 x weekly - 1 sets - 3-5 reps - Seated March  - 1-2 x daily - 7 x weekly - 1 sets - 3-5 reps - Seated Heel Raise  - 1-2 x daily - 7 x weekly - 1 sets - 3-5 reps - Seated Toe Raise  - 1-2 x daily - 7 x weekly - 1 sets - 3-5 reps - Seated Scapular  Retraction  - 1-2 x daily - 7 x weekly - 1 sets - 3-5 reps Person educated: Patient Education method: Explanation, Demonstration, and Handouts Education comprehension: verbalized understanding and returned demonstration  HOME EXERCISE PROGRAM:  Seated Long Arc Quad  - 1-2 x daily - 7 x weekly - 1 sets - 3-5 reps - Seated March  - 1-2 x daily - 7 x weekly - 1 sets - 3-5 reps - Seated Heel Raise  - 1-2 x daily - 7 x weekly - 1 sets - 3-5 reps - Seated Toe Raise  - 1-2 x daily - 7 x weekly - 1 sets - 3-5 reps - Seated Scapular Retraction  - 1-2 x daily - 7 x weekly - 1 sets - 3-5 reps  ASSESSMENT:  CLINICAL IMPRESSION: Patient is a 53 y.o. female who was seen today for physical therapy evaluation and treatment for complaints of deconditoning/fatigue secondary to metastatic breast cancer that has progressed to bones and peritoneum. She has a Colostomy and PEG. She has had multiple hospitalizations (about 10 per pt ) since June. She has had prior radiation to her spine. [facility] has told her there are no other treatments available, however, she has been to [facility] and is awaiting a second opinion to see if she has any other treatment options . She has very low Hg; 8- 8.7 recently. She was instructed in the importance  of eating before she comes to therapy to keep her blood sugar up, and also that she always communicate with us  about pain/fatigue etc. She will benefit fro skilled therapy 1x/week to review and update her HEP, discuss and implement energy conservation techniques and to progress strength, gait, balance as able.  OBJECTIVE IMPAIRMENTS: decreased activity tolerance, decreased balance, decreased endurance, decreased knowledge of condition, difficulty walking, decreased ROM, decreased strength, impaired UE functional use, postural dysfunction, and pain.   ACTIVITY LIMITATIONS: carrying, lifting, standing, squatting, sleeping, bed mobility, bathing, toileting, dressing, reach over head, hygiene/grooming, and locomotion level  PARTICIPATION LIMITATIONS: cleaning, laundry, driving, shopping, and occupation  PERSONAL FACTORS: 3+ comorbidities: bilateral breast cancer,chemo, radiation, bone and peritoneal METS are also affecting patient's functional outcome.   REHAB POTENTIAL: Fair to good  CLINICAL DECISION MAKING: Evolving/moderate complexity  EVALUATION COMPLEXITY: Moderate  GOALS: Goals reviewed with patient? Yes  SHORT TERM GOALS=LONG TERM GOALS: Target date: 06/27/2023  Pt will be independent in a HEP to improve UE, core and LE strength Baseline: Goal status: INITIAL  2.  Pt will be educated in energy conservation techniques to use at home to decrease fatigue Baseline:  Goal status: INITIAL  3.  Pt will note improved ease rising from chairs with 25-50% less wt needed into arms to rise from chair with B arms Baseline:  Goal status: INITIAL  4.  Pt will improve overhead reach to 120 degrees for improved ability to perform home and self care activities Baseline:  Goal status: INITIAL    PLAN:  PT FREQUENCY: 1x/week  PT DURATION: 6 weeks  PLANNED INTERVENTIONS: 97164- PT Re-evaluation, 97110-Therapeutic exercises, 97530- Therapeutic activity, 97112- Neuromuscular re-education,  97535- Self Care, [id number]- Manual therapy, [phone]- Gait training, and Patient/Family education  PLAN FOR NEXT SESSION: check pulse manually unless she has removed or cut long nails can use pulse oximeter, frequent rest due to low Hg,review HEP, add clasped hands flexion, sit to stand high table, (use firm cushion at home to elevate) gait training with rest prn, flexibility   Grayce JINNY Sheldon, PT 05/15/2024, 12:56 PM

## 2024-05-15 ENCOUNTER — Encounter: Payer: Self-pay | Admitting: *Deleted

## 2024-05-15 ENCOUNTER — Telehealth: Payer: MEDICAID | Admitting: *Deleted

## 2024-05-15 ENCOUNTER — Ambulatory Visit: Payer: MEDICAID | Attending: Internal Medicine

## 2024-05-15 ENCOUNTER — Other Ambulatory Visit: Payer: Self-pay

## 2024-05-15 DIAGNOSIS — G893 Neoplasm related pain (acute) (chronic): Secondary | ICD-10-CM | POA: Diagnosis present

## 2024-05-15 DIAGNOSIS — C7951 Secondary malignant neoplasm of bone: Secondary | ICD-10-CM | POA: Diagnosis present

## 2024-05-15 DIAGNOSIS — M6281 Muscle weakness (generalized): Secondary | ICD-10-CM | POA: Insufficient documentation

## 2024-05-15 DIAGNOSIS — C786 Secondary malignant neoplasm of retroperitoneum and peritoneum: Secondary | ICD-10-CM | POA: Insufficient documentation

## 2024-05-16 ENCOUNTER — Other Ambulatory Visit (HOSPITAL_COMMUNITY): Payer: Self-pay

## 2024-05-16 ENCOUNTER — Ambulatory Visit (INDEPENDENT_AMBULATORY_CARE_PROVIDER_SITE_OTHER): Payer: MEDICAID | Admitting: Primary Care

## 2024-05-16 ENCOUNTER — Encounter (INDEPENDENT_AMBULATORY_CARE_PROVIDER_SITE_OTHER): Payer: Self-pay | Admitting: Primary Care

## 2024-05-16 VITALS — BP 93/60 | HR 98 | Resp 16 | Wt 139.8 lb

## 2024-05-16 DIAGNOSIS — Z09 Encounter for follow-up examination after completed treatment for conditions other than malignant neoplasm: Secondary | ICD-10-CM | POA: Diagnosis not present

## 2024-05-16 NOTE — Progress Notes (Signed)
 [facility]  Chelsea Hansen, is a 53 y.o. female  RDW:[id number]  FMW:[id number]  DOB - 11-Dec-1970  Chief Complaint  Patient presents with   Hospitalization Follow-up    Admitted 04/29/24       Subjective:   Chelsea Hansen is a 53 y.o. female here today for an acute visit.  She is in today after hospital follow-up.  She has decided to no longer be involved in hospice.  She has found an oncologist for second opinion about her condition and treatment.  She is frail and cachectic looking.  Spirits remain good and positive.  Labs drawn after hospitalization and will be forwarded to oncologist.  She will sign a release form to have records sent to [facility].  She still has a peg tube for supplemental feedings and tolerating solid foods and having bowel movements.  HPI  No problems updated.  Comprehensive ROS Pertinent positive and negative noted in HPI   Allergies  Allergen Reactions   Enhertu  [Fam-Trastuzumab  Deruxtec-Nxki] Other (See Comments)    Headache and chest pain. See progress note from 02/16/2024. Patient able to complete infusion.   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 Lynwood Nasuti    Hyperlipidemia    on meds   Hypertension    on meds   Peritoneal carcinomatosis (HCC)    S/P percutaneous endoscopic gastrostomy (PEG) tube placement (HCC)    SVT (supraventricular tachycardia)     Current Outpatient Medications on File Prior to Visit  Medication Sig Dispense Refill   albuterol  (VENTOLIN  HFA) 108 (90 Base) MCG/ACT inhaler Inhale 2 puffs into the lungs every 6 (six) hours as needed for wheezing or shortness of breath.     calcitRIOL  (ROCALTROL ) 0.25 MCG capsule Take 1 capsule (0.25 mcg total) by mouth 2 (two) times daily. 60 capsule 0   calcium  carbonate (OS-CAL - DOSED IN MG OF ELEMENTAL CALCIUM ) 1250 (500 Ca) MG tablet Take 5 tablets (6,250 mg total) by mouth 3 (three) times daily with meals. 450 tablet 0   dexamethasone  (DECADRON ) 2 MG tablet Take 1 tablet (2 mg total) by mouth every 8 (eight) hours. 90 tablet 0   DULoxetine  (CYMBALTA ) 30 MG capsule Take 1 capsule (30 mg total) by mouth at bedtime. 30 capsule 1   ergocalciferol , VITAMIN D2, (DRISDOL ) 200 MCG/ML drops Take 1 mL (8,000 Units total) by mouth daily. 30 mL 0   fentaNYL  (DURAGESIC ) 100 MCG/HR Place 1 patch onto the skin every 3 (three) days. 5 patch 0   magnesium  oxide (MAG-OX) 400 (240 Mg) MG tablet Take 1 tablet (400 mg total)  by mouth 2 (two) times daily for 15 days. 30 tablet 0   magnesium  oxide (MAG-OX) 400 MG tablet Take 1 tablet (400 mg total) by mouth daily. 5 tablet 0   methadone  (DOLOPHINE ) 5 MG tablet Take 1 tablet by mouth three times a day 45 tablet 0   Multiple Vitamin (MULTIVITAMIN WITH MINERALS) TABS tablet Take 1 tablet by mouth daily. 30 tablet 0   Nystatin  (GERHARDT'S BUTT CREAM) CREA Apply topically 3 (three) times daily. 120 each 0   OLANZapine  zydis (ZYPREXA ) 5 MG disintegrating tablet Take 1 tablet (5 mg total) by mouth at bedtime. 30 tablet 0   ondansetron  (ZOFRAN -ODT) 4 MG disintegrating tablet Dissolve 1 tablet by mouth every 4 hours as needed for nausea or vomiting 30 tablet 0    oxyCODONE  (OXYCONTIN ) 80 mg 12 hr tablet Take 1 tablet (80 mg total) by mouth every 12 hours. (Remove fentanyl  patch and start Oxycontin  12 hours later) 60 tablet 0   oxyCODONE  (ROXICODONE ) 15 MG immediate release tablet Take 1 tablet (15 mg total) by mouth every 4 (four) hours as needed for Pain for up to 14 days 84 tablet 0   pantoprazole  (PROTONIX ) 40 MG tablet Take 1 tablet (40 mg total) by mouth 2 (two) times daily before a meal. 30 tablet 1   polyethylene glycol powder (MIRALAX ) 17 GM/SCOOP powder Take 17 g by mouth daily as needed. Dissolve 1 capful (17g) in 4-8 ounces of liquid and take by mouth daily. 238 g 0   scopolamine  (TRANSDERM-SCOP) 1 MG/3DAYS Place 1 patch (1 mg total) onto the skin every 3 (three) days. 10 patch 12   senna (SENOKOT) 8.6 MG TABS tablet Take 1 tablet (8.6 mg total) by mouth 2 (two) times daily. 60 tablet 0   Vitamin D , Ergocalciferol , (DRISDOL ) 1.25 MG (50000 UNIT) CAPS capsule Take 1 capsule (50,000 Units total) by mouth once a week for 8 doses. 8 capsule 0   zinc  oxide 20 % ointment Apply topically every 12 (twelve) hours. 56.7 g 0   No current facility-administered medications on file prior to visit.   Health Maintenance  Topic Date Due   DTaP/Tdap/Td vaccine (1 - Tdap) Never done   Pneumococcal Vaccine for age over 21 (1 of 2 - PCV) Never done   Hepatitis B Vaccine (1 of 3 - 19+ 3-dose series) Never done   Zoster (Shingles) Vaccine (1 of 2) Never done   COVID-19 Vaccine (3 - Pfizer risk series) 12/02/2020   Breast Cancer Screening  03/14/2024   Flu Shot  03/21/2025*   Colon Cancer Screening  11/07/2025   Pap with HPV screening  09/15/2027   Hepatitis C Screening  Completed   HIV Screening  Completed   HPV Vaccine  Aged Out   Meningitis B Vaccine  Aged Out  *Topic was postponed. The date shown is not the original due date.    Objective:   Vitals:   05/16/24 0929  BP: 93/60  Pulse: 98  Resp: 16  SpO2: 98%  Weight: 139 lb 12.8 oz (63.4 kg)    Physical Exam Vitals reviewed.  Constitutional:      Appearance: She is ill-appearing.  HENT:     Head: Normocephalic.     Right Ear: Tympanic membrane, ear canal and external ear normal.     Left Ear: Tympanic membrane, ear canal and external ear normal.     Nose: Nose normal.     Mouth/Throat:     Mouth: Mucous membranes are dry.  Eyes:     General: Scleral icterus present.     Extraocular Movements: Extraocular movements intact.     Conjunctiva/sclera: Conjunctivae normal.     Pupils: Pupils are equal, round, and reactive to light.  Cardiovascular:     Rate and Rhythm: Normal rate and regular rhythm.  Pulmonary:     Effort: Pulmonary effort is normal.     Breath sounds: Normal breath sounds.  Abdominal:     General: Bowel sounds are normal.     Palpations: Abdomen is soft.     Tenderness: There is abdominal tenderness.  Musculoskeletal:        General: Normal range of motion.     Cervical back: Normal range of motion.  Skin:    General: Skin is warm and dry.  Neurological:     Mental Status: She is alert and oriented to person, place, and time.  Psychiatric:        Mood and Affect: Mood normal.        Behavior: Behavior normal.        Thought Content: Thought content normal.     Assessment & Plan  Dayami was seen today for hospitalization follow-up.  Diagnoses and all orders for this visit:  Hospital discharge follow-up See HPI -     Magnesium , RBC -     CBC with Differential -     CMP14+EGFR     Patient have been counseled extensively about nutrition and exercise. Other issues discussed during this visit include: low cholesterol diet, weight control and daily exercise, foot care, annual eye examinations at Ophthalmology, importance of adherence with medications and regular follow-up. We also discussed long term complications of uncontrolled diabetes and hypertension.   As needed  The patient was given clear instructions to go to ER or return to medical  center if symptoms don't improve, worsen or new problems develop. The patient verbalized understanding. The patient was told to call to get lab results if they haven't heard anything in the next week.   This note has been created with Education officer, environmental. Any transcriptional errors are unintentional.   Rosaline SHAUNNA Bohr, NP 05/16/2024, 9:47 AM

## 2024-05-18 ENCOUNTER — Telehealth: Payer: Self-pay | Admitting: *Deleted

## 2024-05-18 ENCOUNTER — Other Ambulatory Visit: Payer: MEDICAID | Admitting: Licensed Clinical Social Worker

## 2024-05-18 NOTE — Patient Instructions (Signed)
 Visit Information  Thank you for taking time to visit with me today. Please don't hesitate to contact me if I can be of assistance to you before our next scheduled telephone appointment.   Following is a copy of your care plan:   Goals Addressed             This Visit's Progress    VBCI Transitions of Care (TOC) Care Plan       Problems:  Recent Hospitalization for treatment of Hypocalcemia No Hospital Follow Up Provider appointment Patient prefers to call and schedule with PCP  Goal:  Over the next 30 days, the patient will not experience hospital readmission  Interventions:  Transitions of Care: Doctor Visits  - discussed the importance of doctor visits Post discharge activity limitations prescribed by provider reviewed Reviewed Signs and symptoms of infection Reviewed patient has an appointment at [facility] for second opinion Discussed patient is working with Tobias, BSW for assistance with connecting with [facility] CM-note reviewed, patient connected Reviewed provider notes and discussed new pain regimen-patient has not started Medications reviewed  Patient Self Care Activities:  Attend all scheduled provider appointments Call pharmacy for medication refills 3-7 days in advance of running out of medications Notify RN Care Manager of TOC call rescheduling needs Participate in Transition of Care Program/Attend TOC scheduled calls Perform IADL's (shopping, preparing meals, housekeeping, managing finances) independently Work with the social worker to address care coordination needs and will continue to work with the clinical team to address health care and disease management related needs  Plan:  Telephone follow up appointment with care management team member scheduled for:  05/25/24 at 10:45am        Patient verbalizes understanding of instructions and care plan provided today and agrees to view in MyChart. Active MyChart status and patient understanding of how to access  instructions and care plan via MyChart confirmed with patient.     Telephone follow up appointment with care management team member scheduled for:05/25/24 at 10:45am  Please call the care guide team at [phone] if you need to cancel or reschedule your appointment.   Please call [phone]-TALK (toll free, 24 hour hotline) go to [facility] Urgent [facility] [address] [place] [id number]) call 911 if you are experiencing a Mental Health or Behavioral Health Crisis or need someone to talk to.  [name] RN, BSN [place]  [facility] Health RN Care Manager [phone]

## 2024-05-18 NOTE — Transitions of Care (Post Inpatient/ED Visit) (Signed)
 Transition of Care week 2  Visit Note  05/18/2024  Name: Chelsea Hansen MRN: [id number]          DOB: [birth year]  Situation: Patient enrolled in [facility] 30-day program. Visit completed with Ms. Fitzsimmons by telephone.   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 Lynwood Nasuti   Hyperlipidemia    on meds   Hypertension    on meds   Peritoneal carcinomatosis (HCC)    S/P percutaneous endoscopic gastrostomy (PEG) tube placement (HCC)    SVT (supraventricular tachycardia)     Assessment: Patient Reported Symptoms: Cognitive Cognitive Status: Able to follow simple commands, Alert and oriented to person, place, and time, Normal speech and language skills      Neurological Neurological Review of Symptoms: No symptoms reported    HEENT HEENT Symptoms Reported: Not assessed      Cardiovascular Cardiovascular Symptoms Reported: No symptoms reported    Respiratory Respiratory Symptoms Reported: No symptoms reported    Endocrine Endocrine Symptoms Reported: No symptoms reported Is patient diabetic?: No    Gastrointestinal Gastrointestinal Symptoms Reported: No symptoms reported Additional Gastrointestinal  Details: G-tube in place, taking everything by mouth Gastrointestinal Self-Management Outcome: 4 (good)    Genitourinary Genitourinary Symptoms Reported: No symptoms reported    Integumentary Integumentary Symptoms Reported: No symptoms reported    Musculoskeletal Musculoskelatal Symptoms Reviewed: No symptoms reported        Psychosocial Psychosocial Symptoms Reported: No symptoms reported         There were no vitals filed for this visit.    Medications Reviewed Today     Reviewed by [name], RN (Registered Nurse) on 05/18/24 at 1548  Med List Status: <None>   Medication Order Taking? Sig Documenting Provider Last Dose Status Informant  albuterol  (VENTOLIN  HFA) 108 (90 Base) MCG/ACT inhaler [id number] Yes Inhale 2 puffs into the lungs every 6 (six) hours as needed for wheezing or shortness of breath. [provider]  Active Self  calcitRIOL  (ROCALTROL ) 0.25 MCG capsule [id number] Yes Take 1 capsule (0.25 mcg total) by mouth 2 (two) times daily. [name], NP  Active Self  calcium  carbonate (OS-CAL - DOSED IN MG OF ELEMENTAL CALCIUM ) 1250 (500 Ca) MG tablet [id number] Yes Take 5 tablets (6,250 mg total) by mouth 3 (three) times daily with meals. Lue Elsie BROCKS, MD  Active Self           Med Note ALLEGRA, [facility] I   Sun Apr 29, 2024 11:13 PM) Duplicate  dexamethasone  (DECADRON ) 2 MG tablet [id number]  Take 1 tablet (2 mg total)  by mouth every 8 (eight) hours.  Patient not taking: Reported on 05/18/2024   Cheryle Page, MD  Active   DULoxetine  (CYMBALTA ) 30 MG capsule [id number]  Take 1 capsule (30 mg total) by mouth at bedtime.  Patient not taking: Reported on 05/18/2024     Active   ergocalciferol , VITAMIN D2, (DRISDOL ) 200 MCG/ML drops [id number] Yes Take 1 mL (8,000 Units total) by mouth daily. Lue Elsie BROCKS, MD  Active Self           Med Note ALLEGRA, [place] I   Sun Apr 29, 2024 11:13 PM) Hasn't started  fentaNYL  (DURAGESIC )  100 MCG/HR [id number] Yes Place 1 patch onto the skin every 3 (three) days. Cheryle Page, MD  Active   magnesium  oxide (MAG-OX) 400 (240 Mg) MG tablet [id number]  Take 1 tablet (400 mg total) by mouth 2 (two) times daily for 15 days.  Patient not taking: Reported on 05/18/2024   Cheryle Page, MD  Active   magnesium  oxide (MAG-OX) 400 MG tablet [id number]  Take 1 tablet (400 mg total) by mouth daily.  Patient not taking: Reported on 05/18/2024     Active   methadone  (DOLOPHINE ) 5 MG tablet [id number]  Take 1 tablet by mouth three times a day  Patient not taking: Reported on 05/18/2024     Active Self           Med Note ALLEGRA, [place] I   Sun Apr 29, 2024 11:14 PM) Hasn't started  Multiple Vitamin (MULTIVITAMIN WITH MINERALS) TABS tablet [id number]  Take 1 tablet by mouth daily.  Patient not taking: Reported on 05/18/2024   Lue Elsie BROCKS, MD  Active Self           Med Note ALLEGRA, [facility] I   Sun Apr 29, 2024 11:14 PM) Hasn't started  Nystatin  (GERHARDT'S BUTT CREAM) CREA [id number] Yes Apply topically 3 (three) times daily. Lue Elsie BROCKS, MD  Active Self           Med Note ALLEGRA, [place] I   Sun Apr 29, 2024 11:15 PM) Hasn't started  OLANZapine  zydis (ZYPREXA ) 5 MG disintegrating tablet [id number]  Take 1 tablet (5 mg total) by mouth at bedtime.  Patient not taking: Reported on 05/18/2024   Lue Elsie BROCKS, MD  Active Self           Med Note ALLEGRA, [facility] I   Sun Apr 29, 2024 11:15 PM) Hasn't started   ondansetron  (ZOFRAN -ODT) 4 MG disintegrating tablet [id number] Yes Dissolve 1 tablet by mouth every 4 hours as needed for nausea or vomiting   Active Self  oxyCODONE  (OXYCONTIN ) 80 mg 12 hr tablet [id number]  Take 1 tablet (80 mg total) by mouth every 12 hours. (Remove fentanyl  patch and start Oxycontin  12 hours later)  Patient not taking: Reported on 05/18/2024     Active   oxyCODONE  (ROXICODONE ) 15 MG immediate release tablet [id number]  Take 1 tablet  (15 mg total) by mouth every 4 (four) hours as needed for Pain for up to 14 days  Patient not taking: Reported on 05/18/2024     Active   pantoprazole  (PROTONIX ) 40 MG tablet [id number]  Take 1 tablet (40 mg total) by mouth 2 (two) times daily before a meal.  Patient not taking: Reported on 05/18/2024   Cheryle Page, MD  Active   polyethylene glycol powder (MIRALAX ) 17 GM/SCOOP powder [id number] Yes Take 17 g by mouth daily as needed. Dissolve 1 capful (17g) in 4-8 ounces of liquid and  take by mouth daily. Cheryle Page, MD  Active   scopolamine  (TRANSDERM-SCOP) 1 MG/3DAYS [id number]  Place 1 patch (1 mg total) onto the skin every 3 (three) days.  Patient not taking: Reported on 05/18/2024   Cheryle Page, MD  Active   senna (SENOKOT) 8.6 MG TABS tablet [id number]  Take 1 tablet (8.6 mg total) by mouth 2 (two) times daily.  Patient not taking: Reported on 05/18/2024   Cheryle Page, MD  Active   Vitamin D , Ergocalciferol , (DRISDOL ) 1.25 MG (50000 UNIT) CAPS capsule [id number] Yes Take 1 capsule (50,000 Units total) by mouth once a week for 8 doses.   Active   zinc  oxide 20 % ointment [id number] Yes Apply topically every 12 (twelve) hours. Cheryle Page, MD  Active   Med List Note [place] Norleen LABOR, RPH-CPP 07/07/22 9196): Abemaciclib  through [facility];   UDS 02/11/22  MR 05/22/22 Medication Agreement signed. LP  Hydrocodone  approved through [phone]             Recommendation:   Continue Current Plan of Care  Follow Up Plan:   Telephone follow-up in 1 week  [name] RN, BSN [place]  Value[facility] Institute [facility] Health RN Care Manager [phone]

## 2024-05-18 NOTE — Patient Instructions (Signed)

## 2024-05-18 NOTE — Patient Outreach (Signed)
 05/18/2024  This is a [facility] patient and is being managed by [facility], SW got patient connected to CM. SW will be closing case out and notified the RNCM that was also listed on the case   Chelsea Hansen Chelsea HEDWIG, PhD [facility] Social Worker Direct Dial: [phone]  Fax: [phone]

## 2024-05-19 ENCOUNTER — Observation Stay (HOSPITAL_COMMUNITY)
Admission: EM | Admit: 2024-05-19 | Discharge: 2024-05-22 | DRG: 947 | Disposition: A | Payer: MEDICAID | Attending: Emergency Medicine | Admitting: Emergency Medicine

## 2024-05-19 ENCOUNTER — Other Ambulatory Visit: Payer: Self-pay

## 2024-05-19 DIAGNOSIS — I10 Essential (primary) hypertension: Secondary | ICD-10-CM | POA: Diagnosis present

## 2024-05-19 DIAGNOSIS — C786 Secondary malignant neoplasm of retroperitoneum and peritoneum: Secondary | ICD-10-CM | POA: Diagnosis present

## 2024-05-19 DIAGNOSIS — K219 Gastro-esophageal reflux disease without esophagitis: Secondary | ICD-10-CM | POA: Diagnosis present

## 2024-05-19 DIAGNOSIS — D649 Anemia, unspecified: Secondary | ICD-10-CM

## 2024-05-19 DIAGNOSIS — C50411 Malignant neoplasm of upper-outer quadrant of right female breast: Secondary | ICD-10-CM

## 2024-05-19 DIAGNOSIS — E440 Moderate protein-calorie malnutrition: Secondary | ICD-10-CM | POA: Diagnosis present

## 2024-05-19 DIAGNOSIS — R52 Pain, unspecified: Secondary | ICD-10-CM | POA: Diagnosis present

## 2024-05-19 DIAGNOSIS — Z170 Estrogen receptor positive status [ER+]: Secondary | ICD-10-CM

## 2024-05-19 DIAGNOSIS — R109 Unspecified abdominal pain: Secondary | ICD-10-CM | POA: Diagnosis present

## 2024-05-19 DIAGNOSIS — E43 Unspecified severe protein-calorie malnutrition: Secondary | ICD-10-CM | POA: Diagnosis present

## 2024-05-19 DIAGNOSIS — E876 Hypokalemia: Principal | ICD-10-CM | POA: Diagnosis present

## 2024-05-19 DIAGNOSIS — Z931 Gastrostomy status: Secondary | ICD-10-CM

## 2024-05-19 LAB — CBC WITH DIFFERENTIAL/PLATELET
Abs Immature Granulocytes: 0.09 K/uL — ABNORMAL HIGH (ref 0.00–0.07)
Basophils Absolute: 0 K/uL (ref 0.0–0.1)
Basophils Relative: 0 %
Eosinophils Absolute: 0 K/uL (ref 0.0–0.5)
Eosinophils Relative: 1 %
HCT: 22.8 % — ABNORMAL LOW (ref 36.0–46.0)
Hemoglobin: 7.2 g/dL — ABNORMAL LOW (ref 12.0–15.0)
Immature Granulocytes: 2 %
Lymphocytes Relative: 20 %
Lymphs Abs: 1 K/uL (ref 0.7–4.0)
MCH: 27 pg (ref 26.0–34.0)
MCHC: 31.6 g/dL (ref 30.0–36.0)
MCV: 85.4 fL (ref 80.0–100.0)
Monocytes Absolute: 0.5 K/uL (ref 0.1–1.0)
Monocytes Relative: 9 %
Neutro Abs: 3.6 K/uL (ref 1.7–7.7)
Neutrophils Relative %: 68 %
Platelets: 238 K/uL (ref 150–400)
RBC: 2.67 MIL/uL — ABNORMAL LOW (ref 3.87–5.11)
RDW: 17 % — ABNORMAL HIGH (ref 11.5–15.5)
WBC: 5.2 K/uL (ref 4.0–10.5)
nRBC: 0 % (ref 0.0–0.2)

## 2024-05-19 MED ORDER — SODIUM CHLORIDE 0.9 % IV BOLUS
1000.0000 mL | Freq: Once | INTRAVENOUS | Status: AC
  Administered 2024-05-20: 1000 mL via INTRAVENOUS

## 2024-05-19 MED ORDER — METOCLOPRAMIDE HCL 5 MG/ML IJ SOLN
10.0000 mg | Freq: Once | INTRAMUSCULAR | Status: AC
Start: 2024-05-19 — End: 2024-05-19
  Administered 2024-05-19: 10 mg via INTRAVENOUS
  Filled 2024-05-19: qty 2

## 2024-05-19 MED ORDER — MORPHINE SULFATE (PF) 4 MG/ML IV SOLN
4.0000 mg | Freq: Once | INTRAVENOUS | Status: AC
  Administered 2024-05-19: 4 mg via INTRAVENOUS
  Filled 2024-05-19: qty 1

## 2024-05-19 NOTE — ED Provider Notes (Signed)
 [place] [facility]GENC[facility] DEPARTMENT AT [facility] Provider Note   CSN: [id number] Arrival date & time: 05/19/24  2207     Patient presents with: Emesis, Abdominal Pain (G tube problem), and Hypotension   Chelsea Hansen is a 53 y.o. female.  Patient with complicated medical history including metastatic breast cancer with widespread abdominal metastases to the peritoneum, small bowel, status post duodenal obstruction.  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  Patient presents this evening via EMS complaining of worsening chronic abdominal pain with nausea and vomiting.  She also states that her G-tube continues to have leaking around the insertion site.  The G-tube replaced Monday of this week.  She was hospitalized for similar symptoms earlier in the month and discharged on November 14.  At that time notes show that oncology had nothing further to offer.  The patient is planning to seek a second opinion at [facility] for any other potential treatment options.  She takes 15 mg of oxycodone  every 4 hours and takes 80 mg of OxyContin  every 12 hours.  The patient also uses fentanyl  patches for pain control.    Emesis Associated symptoms: abdominal pain   Abdominal Pain Associated symptoms: vomiting        Prior to Admission medications   Medication Sig Start Date End Date Taking? Authorizing Provider  oxyCODONE  (ROXICODONE ) 15 MG immediate release tablet Take 1 tablet (15 mg total) by mouth every 4 (four) hours as needed for Pain for up to 14 days 05/10/24  Yes   calcium  carbonate (OS-CAL - DOSED IN MG OF ELEMENTAL CALCIUM ) 1250 (500 Ca) MG tablet Take 5 tablets (6,250 mg total) by mouth 3 (three) times daily with meals. Patient not taking: Reported on 05/20/2024 04/19/24   Lue Elsie BROCKS, MD  dexamethasone   (DECADRON ) 2 MG tablet Take 1 tablet (2 mg total) by mouth every 8 (eight) hours. Patient not taking: No sig reported 05/04/24   Cheryle Page, MD  DULoxetine  (CYMBALTA ) 30 MG capsule Take 1 capsule (30 mg total) by mouth at bedtime. Patient not taking: Reported on 05/18/2024 05/14/24     ergocalciferol , VITAMIN D2, (DRISDOL ) 200 MCG/ML drops Take 1 mL (8,000 Units total) by mouth daily. Patient not taking: Reported on 05/20/2024 04/19/24   Lue Elsie BROCKS, MD  hydrocortisone  (CORTEF ) 10 MG tablet Take 10 mg by mouth daily. Patient not taking: Reported on 05/20/2024 04/19/24   [provider]  OLANZapine  zydis (ZYPREXA ) 5 MG disintegrating tablet Take 1 tablet (5 mg total) by mouth at bedtime. Patient not taking: No sig reported 04/19/24   Lue Elsie BROCKS, MD  ondansetron  (ZOFRAN -ODT) 4 MG disintegrating tablet Dissolve 1 tablet by mouth every 4 hours as needed for nausea or vomiting Patient not taking: Reported on 05/20/2024 04/25/24     oxyCODONE  (OXYCONTIN ) 80 mg 12 hr tablet Take 1 tablet (80 mg total) by mouth every 12 hours. (Remove fentanyl  patch and start Oxycontin  12 hours later) Patient not taking: Reported on 05/18/2024 05/14/24     pantoprazole  (PROTONIX ) 40 MG tablet Take 1 tablet (40 mg total) by mouth 2 (two) times daily before a meal. Patient not taking: Reported on 05/20/2024 05/04/24 06/03/24  Cheryle Page, MD  scopolamine  (TRANSDERM-SCOP) 1 MG/3DAYS Place 1 patch (1 mg total) onto the skin every 3 (three) days. Patient not taking: Reported on 05/18/2024 05/06/24   Cheryle,  Kshitiz, MD  zinc  oxide 20 % ointment Apply topically every 12 (twelve) hours. Patient not taking: Reported on 05/20/2024 05/04/24   Cheryle Page, MD    Allergies: Enhertu  [fam-trastuzumab  deruxtec-nxki] and Xgeva  [denosumab ]    Review of Systems  Gastrointestinal:  Positive for abdominal pain and vomiting.    Updated Vital Signs BP 96/61   Pulse 72   Temp 98.3 F (36.8 C)    Resp (!) 9   SpO2 100%   Physical Exam Vitals and nursing note reviewed.  Constitutional:      Comments: Chronically ill-appearing  HENT:     Head: Normocephalic.     Nose: Nose normal.     Mouth/Throat:     Mouth: Mucous membranes are dry.  Eyes:     Extraocular Movements: Extraocular movements intact.     Pupils: Pupils are equal, round, and reactive to light.  Cardiovascular:     Rate and Rhythm: Normal rate and regular rhythm.     Pulses: Normal pulses.     Heart sounds: Normal heart sounds.  Pulmonary:     Effort: Pulmonary effort is normal.     Breath sounds: Normal breath sounds.  Abdominal:     Comments: Patient has a G-tube with some mild cellulitis around the tube.  Patient has some gastric content around the tube.  Patient has a colostomy in the left lower quadrant with greenish stool.  Musculoskeletal:        General: Normal range of motion.     Cervical back: Normal range of motion and neck supple.  Skin:    General: Skin is warm.     Capillary Refill: Capillary refill takes less than 2 seconds.  Neurological:     General: No focal deficit present.     Mental Status: She is oriented to person, place, and time.  Psychiatric:        Mood and Affect: Mood normal.        Behavior: Behavior normal.     (all labs ordered are listed, but only abnormal results are displayed) Labs Reviewed  CBC WITH DIFFERENTIAL/PLATELET - Abnormal; Notable for the following components:      Result Value   RBC 2.67 (*)    Hemoglobin 7.2 (*)    HCT 22.8 (*)    RDW 17.0 (*)    Abs Immature Granulocytes 0.09 (*)    All other components within normal limits  COMPREHENSIVE METABOLIC PANEL WITH GFR - Abnormal; Notable for the following components:   Potassium 2.8 (*)    Creatinine, Ser 0.37 (*)    Calcium  5.5 (*)    Total Protein 5.9 (*)    Albumin  2.8 (*)    Alkaline Phosphatase 165 (*)    All other components within normal limits  LIPASE, BLOOD  URINALYSIS, ROUTINE W REFLEX  MICROSCOPIC  TYPE AND SCREEN  PREPARE RBC (CROSSMATCH)    EKG: EKG Interpretation Date/Time:  Sunday May 20 2024 00:41:41 EST Ventricular Rate:  80 PR Interval:  117 QRS Duration:  113 QT Interval:  447 QTC Calculation: 516 R Axis:   1  Text Interpretation: Sinus rhythm Borderline short PR interval Consider anterior infarct Prolonged QT interval Baseline wander in lead(s) V3 Confirmed by Jerrol Agent ([id number]) on 05/20/2024 1:49:23 AM  Radiology: CT ABDOMEN PELVIS W CONTRAST Result Date: 05/20/2024 EXAM: CT ABDOMEN AND PELVIS WITH CONTRAST 05/20/2024 01:40:59 AM TECHNIQUE: CT of the abdomen and pelvis was performed with the administration of 75 mL of iohexol  (OMNIPAQUE ) 350 MG/ML  injection. Multiplanar reformatted images are provided for review. Automated exposure control, iterative reconstruction, and/or weight-based adjustment of the mA/kV was utilized to reduce the radiation dose to as low as reasonably achievable. COMPARISON: Comparison with the 04/29/2024 study. CLINICAL HISTORY: Abdominal pain, acute, nonlocalized; Known stomach cancer, leak around g-tube, worsening generalized abdominal pain. FINDINGS: LOWER CHEST: No acute abnormality. HEPATOBILIARY: The liver is unremarkable. Gallbladder is unremarkable. No biliary ductal dilatation. SPLEEN: No acute abnormality. PANCREAS: No acute abnormality. ADRENAL GLANDS: No acute abnormality. KIDNEYS, URETERS AND BLADDER: No stones in the kidneys or ureters. No hydronephrosis. No perinephric or periureteral stranding. Urinary bladder is unremarkable. GI AND BOWEL: Postoperative change about the stomach. Similar mild gastric wall thickening at the percutaneous gastrostomy tube insertion site. Colonic diverticulosis without diverticulitis. There is no bowel obstruction. PERITONEUM AND RETROPERITONEUM: No ascites. No free air. VASCULATURE: Aorta is normal in caliber. LYMPH NODES: No lymphadenopathy. REPRODUCTIVE ORGANS: No acute abnormality. BONES  AND SOFT TISSUES: Diffuse sclerotic osseous metastatic disease. Stable chronic compression fracture of L3. No focal soft tissue abnormality. IMPRESSION: 1. No acute findings. 2. Similar appearance of the percutaneous gastrostomy tube without surrounding fluid collection. Associated mild wall thickening at the insertion site is similar. 3. Diffuse sclerotic osseous metastatic disease, similar to prior. Electronically signed by: Norman Gatlin MD 05/20/2024 01:52 AM EST RP Workstation: [id number]     .Critical Care  Performed by: Logan Ubaldo NOVAK, PA-C Authorized by: Logan Ubaldo NOVAK, PA-C   Critical care provider statement:    Critical care time (minutes):  30   Critical care was necessary to treat or prevent imminent or life-threatening deterioration of the following conditions: Hypocalcemia, hypokalemia, anemia requiring transfusion.   Critical care was time spent personally by me on the following activities:  Development of treatment plan with patient or surrogate, discussions with consultants, evaluation of patient's response to treatment, examination of patient, ordering and review of laboratory studies, ordering and review of radiographic studies, ordering and performing treatments and interventions, pulse oximetry, re-evaluation of patient's condition and review of old charts    Medications Ordered in the ED  potassium chloride  10 mEq in 100 mL IVPB (0 mEq Intravenous Stopped 05/20/24 0238)  0.9 %  sodium chloride  infusion (Manually program via Guardrails IV Fluids) (has no administration in time range)  sodium chloride  0.9 % bolus 1,000 mL (0 mLs Intravenous Stopped 05/20/24 0116)  morphine  (PF) 4 MG/ML injection 4 mg (4 mg Intravenous Given 05/19/24 2322)  metoCLOPramide  (REGLAN ) injection 10 mg (10 mg Intravenous Given 05/19/24 2326)  calcium  gluconate 1 g/ 50 mL sodium chloride  IVPB (0 mg Intravenous Stopped 05/20/24 0210)  droperidol  (INAPSINE ) 2.5 MG/ML injection 1.25 mg (1.25 mg  Intravenous Given 05/20/24 0107)  iohexol  (OMNIPAQUE ) 350 MG/ML injection 75 mL (75 mLs Intravenous Contrast Given 05/20/24 0141)                                    Medical Decision Making Amount and/or Complexity of Data Reviewed Labs: ordered. Radiology: ordered.  Risk Prescription drug management. Decision regarding hospitalization.   This patient presents to the ED for concern of abdominal pain, this involves an extensive number of treatment options, and is a complaint that carries with it a high risk of complications and morbidity.  The differential diagnosis includes pain from metastatic cancer, surgical/acute abdomen, chronic pain, others   Co morbidities / Chronic conditions that complicate the patient evaluation  As noted in  HPI   Additional history obtained:  Additional history obtained from EMR External records from outside source obtained and reviewed including recent discharge summary   Lab Tests:  I Ordered, and personally interpreted labs.  The pertinent results include: Calcium  5.5, potassium 2.8, hemoglobin 7.2   Imaging Studies ordered:  I ordered imaging studies including CT abdomen pelvis with contrast I independently visualized and interpreted imaging which showed  1. No acute findings.  2. Similar appearance of the percutaneous gastrostomy tube without surrounding  fluid collection. Associated mild wall thickening at the insertion site is  similar.  3. Diffuse sclerotic osseous metastatic disease, similar to prior.   I agree with the radiologist interpretation   Cardiac Monitoring: / EKG:  The patient was maintained on a cardiac monitor.  I personally viewed and interpreted the cardiac monitored which showed an underlying rhythm of: Sinus rhythm with prolonged QT   Problem List / ED Course / Critical interventions / Medication management   I ordered medication including potassium, calcium , Reglan , morphine , droperidol , saline bolus, 1  unit PRBC Reevaluation of the patient after these medicines showed that the patient improved some but continued to have abdominal pain and nausea   Consultations Obtained:  I requested consultation with the hospitalist, Dr.Howerter, and discussed lab and imaging findings as well as pertinent plan - they recommend: admission   Test / Admission - Considered:  Patient with flare of her chronic abdominal pain due to her metastatic cancer.  Her electrolytes are abnormal with significant hypocalcemia and with hypokalemia.  She is also feeling weaker than usual and has a hemoglobin of 7.2.  4 days ago her hemoglobin was 8.8.  She was transfused with a hemoglobin of 7 at her most recent hospitalization.  With symptomatic anemia and a significant hemoglobin drop over the past week a unit of PRBC was ordered.  Her CT scan shows no acute findings.  Chart review shows that routine changing of her G-tube is not recommended as it may cause further problems.  No new changes or concerns about deeper infection noted on CT.  No leukocytosis at this time.  With multiple electrolyte abnormalities and the need for transfusion, and continued nausea, vomiting, and abdominal pain, I feel that the patient would benefit from hospital admission at this time.  Of note, the patient was on home hospice/palliative care but has since discontinued home hospice and is a full code at this time      Final diagnoses:  Hypokalemia  Hypocalcemia  Symptomatic anemia    ED Discharge Orders     None          Logan Ubaldo KATHEE DEVONNA 05/20/24 0240    Jerrol Agent, MD 05/20/24 [phone]

## 2024-05-19 NOTE — ED Notes (Signed)
 Pulled labs from implanted port and medications administered. Patient stated she felt her G tube starting to leak again. On visual inspection, G tube in place but foul smelling liquid present around insertion site.

## 2024-05-19 NOTE — ED Notes (Signed)
 Existing port in upper R chest accessed with 20G PowerLoc. Minimal blood return noted in tubing, unable to get labs. Port flushes easily with no swelling or erythema noted.

## 2024-05-20 ENCOUNTER — Emergency Department (HOSPITAL_COMMUNITY): Payer: MEDICAID

## 2024-05-20 ENCOUNTER — Encounter (HOSPITAL_COMMUNITY): Payer: Self-pay | Admitting: Internal Medicine

## 2024-05-20 DIAGNOSIS — Z931 Gastrostomy status: Secondary | ICD-10-CM

## 2024-05-20 DIAGNOSIS — D649 Anemia, unspecified: Secondary | ICD-10-CM | POA: Diagnosis not present

## 2024-05-20 DIAGNOSIS — C786 Secondary malignant neoplasm of retroperitoneum and peritoneum: Secondary | ICD-10-CM | POA: Diagnosis not present

## 2024-05-20 DIAGNOSIS — R1084 Generalized abdominal pain: Secondary | ICD-10-CM | POA: Insufficient documentation

## 2024-05-20 DIAGNOSIS — I10 Essential (primary) hypertension: Secondary | ICD-10-CM

## 2024-05-20 DIAGNOSIS — C50411 Malignant neoplasm of upper-outer quadrant of right female breast: Secondary | ICD-10-CM

## 2024-05-20 DIAGNOSIS — R9431 Abnormal electrocardiogram [ECG] [EKG]: Secondary | ICD-10-CM

## 2024-05-20 DIAGNOSIS — Z170 Estrogen receptor positive status [ER+]: Secondary | ICD-10-CM

## 2024-05-20 DIAGNOSIS — K2100 Gastro-esophageal reflux disease with esophagitis, without bleeding: Secondary | ICD-10-CM

## 2024-05-20 DIAGNOSIS — E876 Hypokalemia: Secondary | ICD-10-CM | POA: Diagnosis not present

## 2024-05-20 DIAGNOSIS — E440 Moderate protein-calorie malnutrition: Secondary | ICD-10-CM

## 2024-05-20 DIAGNOSIS — R112 Nausea with vomiting, unspecified: Secondary | ICD-10-CM

## 2024-05-20 LAB — CBC WITH DIFFERENTIAL/PLATELET
Abs Immature Granulocytes: 0.07 K/uL (ref 0.00–0.07)
Basophils Absolute: 0 K/uL (ref 0.0–0.1)
Basophils Relative: 0 %
Eosinophils Absolute: 0 K/uL (ref 0.0–0.5)
Eosinophils Relative: 0 %
HCT: 30.8 % — ABNORMAL LOW (ref 36.0–46.0)
Hemoglobin: 9.7 g/dL — ABNORMAL LOW (ref 12.0–15.0)
Immature Granulocytes: 1 %
Lymphocytes Relative: 16 %
Lymphs Abs: 0.8 K/uL (ref 0.7–4.0)
MCH: 26.6 pg (ref 26.0–34.0)
MCHC: 31.5 g/dL (ref 30.0–36.0)
MCV: 84.4 fL (ref 80.0–100.0)
Monocytes Absolute: 0.4 K/uL (ref 0.1–1.0)
Monocytes Relative: 8 %
Neutro Abs: 3.9 K/uL (ref 1.7–7.7)
Neutrophils Relative %: 75 %
Platelets: 215 K/uL (ref 150–400)
RBC: 3.65 MIL/uL — ABNORMAL LOW (ref 3.87–5.11)
RDW: 17.1 % — ABNORMAL HIGH (ref 11.5–15.5)
WBC: 5.3 K/uL (ref 4.0–10.5)
nRBC: 0 % (ref 0.0–0.2)

## 2024-05-20 LAB — APTT: aPTT: 31 s (ref 24–36)

## 2024-05-20 LAB — COMPREHENSIVE METABOLIC PANEL WITH GFR
ALT: 15 U/L (ref 0–44)
ALT: 18 U/L (ref 0–44)
AST: 18 U/L (ref 15–41)
AST: 18 U/L (ref 15–41)
Albumin: 2.6 g/dL — ABNORMAL LOW (ref 3.5–5.0)
Albumin: 2.8 g/dL — ABNORMAL LOW (ref 3.5–5.0)
Alkaline Phosphatase: 165 U/L — ABNORMAL HIGH (ref 38–126)
Alkaline Phosphatase: 175 U/L — ABNORMAL HIGH (ref 38–126)
Anion gap: 12 (ref 5–15)
Anion gap: 12 (ref 5–15)
BUN: 6 mg/dL (ref 6–20)
BUN: 6 mg/dL (ref 6–20)
CO2: 17 mmol/L — ABNORMAL LOW (ref 22–32)
CO2: 23 mmol/L (ref 22–32)
Calcium: 5.5 mg/dL — CL (ref 8.9–10.3)
Calcium: 5.7 mg/dL — CL (ref 8.9–10.3)
Chloride: 102 mmol/L (ref 98–111)
Chloride: 107 mmol/L (ref 98–111)
Creatinine, Ser: 0.37 mg/dL — ABNORMAL LOW (ref 0.44–1.00)
Creatinine, Ser: 0.37 mg/dL — ABNORMAL LOW (ref 0.44–1.00)
GFR, Estimated: >60 mL/min (ref >60)
GFR, Estimated: >60 mL/min (ref >60)
Glucose, Bld: 100 mg/dL — ABNORMAL HIGH (ref 70–99)
Glucose, Bld: 94 mg/dL (ref 70–99)
Potassium: 2.8 mmol/L — ABNORMAL LOW (ref 3.5–5.1)
Potassium: 3.9 mmol/L (ref 3.5–5.1)
Sodium: 136 mmol/L (ref 135–145)
Sodium: 137 mmol/L (ref 135–145)
Total Bilirubin: 0.9 mg/dL (ref 0.0–1.2)
Total Bilirubin: 1.2 mg/dL (ref 0.0–1.2)
Total Protein: 5.3 g/dL — ABNORMAL LOW (ref 6.5–8.1)
Total Protein: 5.9 g/dL — ABNORMAL LOW (ref 6.5–8.1)

## 2024-05-20 LAB — IRON AND TIBC
Iron: 166 ug/dL (ref 28–170)
Saturation Ratios: 87 % — ABNORMAL HIGH (ref 10.4–31.8)
TIBC: 190 ug/dL — ABNORMAL LOW (ref 250–450)
UIBC: 24 ug/dL

## 2024-05-20 LAB — FERRITIN: Ferritin: 1084 ng/mL — ABNORMAL HIGH (ref 11–307)

## 2024-05-20 LAB — PROTIME-INR
INR: 1.1 (ref 0.8–1.2)
Prothrombin Time: 14.8 s (ref 11.4–15.2)

## 2024-05-20 LAB — PREPARE RBC (CROSSMATCH)

## 2024-05-20 LAB — MAGNESIUM: Magnesium: 1.5 mg/dL — ABNORMAL LOW (ref 1.7–2.4)

## 2024-05-20 LAB — LIPASE, BLOOD: Lipase: 29 U/L (ref 11–51)

## 2024-05-20 MED ORDER — ACETAMINOPHEN 650 MG RE SUPP: 650.0000 mg | Freq: Four times a day (QID) | RECTAL | Status: DC | PRN

## 2024-05-20 MED ORDER — POTASSIUM CHLORIDE 10 MEQ/100ML IV SOLN
10.0000 meq | INTRAVENOUS | Status: AC
  Administered 2024-05-20 (×2): 10 meq via INTRAVENOUS
  Filled 2024-05-20 (×2): qty 100

## 2024-05-20 MED ORDER — OXYCODONE HCL ER 40 MG PO T12A
40.0000 mg | EXTENDED_RELEASE_TABLET | Freq: Two times a day (BID) | ORAL | Status: DC
  Filled 2024-05-20: qty 4

## 2024-05-20 MED ORDER — LORAZEPAM 2 MG/ML IJ SOLN: 0.5000 mg | Freq: Four times a day (QID) | INTRAMUSCULAR | Status: DC | PRN

## 2024-05-20 MED ORDER — LACTATED RINGERS IV SOLN: INTRAVENOUS | Status: DC

## 2024-05-20 MED ORDER — DROPERIDOL 2.5 MG/ML IJ SOLN
1.2500 mg | Freq: Once | INTRAMUSCULAR | Status: AC
  Administered 2024-05-20: 1.25 mg via INTRAVENOUS
  Filled 2024-05-20: qty 2

## 2024-05-20 MED ORDER — MAGNESIUM SULFATE 4 GM/100ML IV SOLN
4.0000 g | Freq: Once | INTRAVENOUS | Status: AC
  Administered 2024-05-20: 4 g via INTRAVENOUS
  Filled 2024-05-20: qty 100

## 2024-05-20 MED ORDER — OXYCODONE HCL ER 10 MG PO T12A
20.0000 mg | EXTENDED_RELEASE_TABLET | Freq: Two times a day (BID) | ORAL | Status: DC
  Administered 2024-05-20 – 2024-05-21 (×3): 20 mg via ORAL
  Filled 2024-05-20 (×2): qty 2

## 2024-05-20 MED ORDER — ACETAMINOPHEN 325 MG PO TABS: 650.0000 mg | ORAL_TABLET | Freq: Four times a day (QID) | ORAL | Status: DC | PRN

## 2024-05-20 MED ORDER — SODIUM CHLORIDE 0.9% FLUSH: 10.0000 mL | INTRAVENOUS | Status: DC | PRN

## 2024-05-20 MED ORDER — SODIUM CHLORIDE 0.9 % IV SOLN: INTRAVENOUS | Status: DC | PRN

## 2024-05-20 MED ORDER — SODIUM CHLORIDE 0.9% IV SOLUTION: Freq: Once | INTRAVENOUS | Status: AC

## 2024-05-20 MED ORDER — CALCIUM GLUCONATE-NACL 1-0.675 GM/50ML-% IV SOLN
1.0000 g | Freq: Once | INTRAVENOUS | Status: AC
  Administered 2024-05-20: 1000 mg via INTRAVENOUS
  Filled 2024-05-20: qty 50

## 2024-05-20 MED ORDER — POTASSIUM CHLORIDE 10 MEQ/100ML IV SOLN
10.0000 meq | INTRAVENOUS | Status: AC
  Administered 2024-05-20 (×4): 10 meq via INTRAVENOUS
  Filled 2024-05-20 (×4): qty 100

## 2024-05-20 MED ORDER — IOHEXOL 350 MG/ML SOLN
75.0000 mL | Freq: Once | INTRAVENOUS | Status: AC | PRN
  Administered 2024-05-20: 75 mL via INTRAVENOUS

## 2024-05-20 MED ORDER — CALCIUM GLUCONATE-NACL 2-0.675 GM/100ML-% IV SOLN
2.0000 g | Freq: Once | INTRAVENOUS | Status: AC
  Administered 2024-05-20: 2000 mg via INTRAVENOUS
  Filled 2024-05-20: qty 100

## 2024-05-20 MED ORDER — HYDROMORPHONE HCL 1 MG/ML IJ SOLN
0.5000 mg | INTRAMUSCULAR | Status: DC | PRN
  Administered 2024-05-20 (×2): 0.5 mg via INTRAVENOUS
  Filled 2024-05-20 (×2): qty 1

## 2024-05-20 MED ORDER — METOCLOPRAMIDE HCL 5 MG/ML IJ SOLN
5.0000 mg | Freq: Four times a day (QID) | INTRAMUSCULAR | Status: DC | PRN
  Administered 2024-05-20 – 2024-05-21 (×2): 5 mg via INTRAVENOUS
  Filled 2024-05-20 (×3): qty 2

## 2024-05-20 MED ORDER — CHLORHEXIDINE GLUCONATE CLOTH 2 % EX PADS
6.0000 | MEDICATED_PAD | Freq: Every day | CUTANEOUS | Status: DC
  Administered 2024-05-20 – 2024-05-22 (×3): 6 via TOPICAL

## 2024-05-20 MED ORDER — PANTOPRAZOLE SODIUM 40 MG PO TBEC
40.0000 mg | DELAYED_RELEASE_TABLET | Freq: Two times a day (BID) | ORAL | Status: DC
  Administered 2024-05-20 – 2024-05-22 (×4): 40 mg via ORAL
  Filled 2024-05-20 (×4): qty 1

## 2024-05-20 MED ORDER — SODIUM CHLORIDE 0.9% FLUSH
10.0000 mL | Freq: Two times a day (BID) | INTRAVENOUS | Status: DC
  Administered 2024-05-20 – 2024-05-22 (×5): 10 mL

## 2024-05-20 MED ORDER — OXYCODONE HCL 5 MG PO TABS
15.0000 mg | ORAL_TABLET | ORAL | Status: DC | PRN
  Administered 2024-05-20 – 2024-05-22 (×3): 15 mg via ORAL
  Filled 2024-05-20 (×3): qty 3

## 2024-05-20 MED ORDER — POTASSIUM CHLORIDE CRYS ER 20 MEQ PO TBCR
40.0000 meq | EXTENDED_RELEASE_TABLET | Freq: Once | ORAL | Status: AC
  Administered 2024-05-20: 40 meq via ORAL
  Filled 2024-05-20: qty 2

## 2024-05-20 NOTE — Progress Notes (Signed)
 Notified Dr. Noralee that patient's g-tube and colostomy have been leaking throughout day and that skin around ostomy site is red.Patient states this is normal for her. Order received for [facility] consult. Heather with [facility] team updated on redness and leaking around ostomy and g-tube as well as what appears to be fungus under left breast. WOC RN states she will come to assess patient tomorrow.

## 2024-05-20 NOTE — H&P (Signed)
 History and Physical      Chelsea Hansen FMW:[id number] DOB: Aug 20, 1970 DOA: 05/19/2024; DOS: 05/20/2024  PCP: [name], NP  Patient coming from: home   I have personally briefly reviewed patient's old medical records in [facility]  Chief Complaint: Abdominal pain  HPI: Chelsea Hansen is a 53 y.o. female with medical history significant for metastatic breast cancer with widespread abdominal metastasis, chronic abdominal pain, anemia of chronic disease with baseline hemoglobin 7.5-9, who is admitted to [facility] on 05/19/2024 with acute on chronic abdominal discomfort after presenting from home to [facility] ED complaining of abdominal pain.   The following history is provided via my discussions with the patient as well as my discussions with the EDP and via chart review.  Patient with metastatic breast cancer complicated by widespread abdominal metastasis, including metastasis to the pancreas, peritoneum.  She also has documentation of a history of small bowel obstruction at the level of the duodenum as a complication of her metastatic breast cancer.  She follows with Dr. [name] as her outpatient oncologist through [facility] health.  Outpatient oncology is reportedly conveyed to the patient that she would likely not benefit from additional chemotherapy or radiation, even from a palliative perspective.  Patient conveys that she is in the process of seeking a second opinion from [facility] oncology to confirm the latter assessment.   He presents to the ED this evening complaining of 2 to 3 days of worsening of her chronic generalized sharp abdominal discomfort, worse with palpation.  She notes that this pain has been refractory to her outpatient analgesic regimen, which consists of oxycodone  IR 15 mg p.o. every 4 hours prn.  She also reports that taking her as needed p.o. oxycodone  IR has been complicated over the last few days by recurrent nausea/vomiting.  She notes that she continues to  move her bowels, produce flatus, noting that most recent bowel movement occurred on the morning of 05/19/2024.  Denies any recent melena or hematochezia.  In the setting of her widespread abdominal metastatic disease, she underwent G-tube placement with interventional radiology on 01/10/2024.  Per chart review, she underwent IR guided replacement of her G-tube on 05/14/2024.  The patient conveys that she has not recently needed to use the G-tube, noting that 100% of her nutrition, fluids, and enteric medications are being administered by mouth.   Her medical history is also notable for anemia of chronic disease with baseline hemoglobin 7.5-9.0.  She has previously required PRBC transfusion, and most recent hemoglobin was noted to be 8.8 when checked on 05/16/2024.  Not on any blood thinners as an outpatient.  The patient conveys that during a previous hospitalization for pain control in the setting of acute on chronic abdominal discomfort, received Dilaudid  PCA which worked well for her.  She conveys that she would be amenable to not a PCA pump during this hospitalization if subsequently pursued.    ED Course:  Vital signs in the ED were notable for the following: Afebrile; heart rates in the 70s to 80s; systolic blood pressures in the 90s low 100s, with most recent blood pressure noted to be 106/72; respiratory rate 14-21, oxygen saturation 99 to 100% on room air  Labs were notable for the following: CMP was notable for the following: Sodium 137, potassium 2.8 noted to 2.9 on 05/16/2024, bicarbonate 23, creatinine 0.37, glucose 94, calcium  adjusted for hypoalbuminemia noted to be 6.5 relative to adjusted calcium  level of 5.9 on 05/16/2024 with today's albumin   level noted to be 2.8, alkaline phosphatase 165 compared to 209 on 05/16/2024, total bilirubin 0.9, AST 18, ALT 18.  Lipase 1 9.  CBC notable for white cell count 5200, hemoglobin 7.2 associate with normocytic/normochromic findings, platelet count  238.  Type and screen has been ordered.  Urinalysis is currently pending.  Per my interpretation, EKG in ED demonstrated the following: Sinus rhythm with prolonged QTc of 516 ms, heart rate 80, nonspecific T wave flattening in aVL, and no evidence of ST changes, including no evidence of ST elevation.  Imaging in the ED, per corresponding formal radiology read, was notable for the following: CT abdomen/pelvis with contrast, in comparison to most recent prior CT abdomen/pelvis performed on 04/29/2024, shows no evidence of acute intra-abdominal or acute intrapelvic process, no significant interval changes relative to most recent prior CT abdomen/pelvis, including no evidence of new bowel obstruction, nor any evidence of bowel perforation/abscess.  While in the ED, the following were administered: Droperidol  1.25 mg IV x 1 dose, Reglan  10 mg IV x 1 dose, morphine  4 g IV x 1 dose, calcium  gluconate 1 g IV, normal saline x 1 L bolus, potassium chloride  40 mill colons IV over 4 hours.  Additionally, EDP is ordered transfusion of 1 unit PRBC.  Subsequently, the patient was admitted for further evaluation management of presenting acute on chronic abdominal discomfort, with presenting labs also notable for acute on chronic anemia and multiple electrolyte abnormalities, including hypokalemia and hypocalcemia.      Review of Systems: As per HPI otherwise 10 point review of systems negative.   Past Medical History:  Diagnosis Date   Allergy    seasonal allergies   Anxiety    on meds   Asthma    uses inhaler   Breast cancer (HCC) 2012   RIGHT lumpectomy   Breast carcinoma metastatic to multiple sites, right (HCC)    Metastasis to bone, pancreas, peritoneum   Cancer (HCC) 2022   RIGHT breast lump-dx 2022   Chronic pain after cancer treatment    Depression    on meds   Duodenal obstruction    Requiring PEG tube   DVT (deep venous thrombosis) (HCC) 2010   after hysterectomy   Family history of  uterine cancer    GERD (gastroesophageal reflux disease)    with certain foods/OTC PRN meds   Headache(784.0)    History of radiation therapy    Bilateral breast- 09/03/21-11/02/21- Dr. Lynwood Nasuti   Hyperlipidemia    on meds   Hypertension    on meds   Peritoneal carcinomatosis (HCC)    S/P percutaneous endoscopic gastrostomy (PEG) tube placement (HCC)    SVT (supraventricular tachycardia)     Past Surgical History:  Procedure Laterality Date   ABDOMINAL HYSTERECTOMY     AXILLARY SENTINEL NODE BIOPSY Left 07/09/2021   Procedure: LEFT AXILLARY SENTINEL NODE BIOPSY;  Surgeon: [name], MD;  Location: [facility];  Service: General;  Laterality: Left;   BALLOON DILATION N/A 12/13/2023   Procedure: MERRILL HODGKIN;  Surgeon: Federico Rosario BROCKS, MD;  Location: [facility];  Service: Gastroenterology;  Laterality: N/A;   BIOPSY  06/29/2022   Procedure: BIOPSY;  Surgeon: Legrand Victory LITTIE DOUGLAS, MD;  Location: [facility];  Service: Gastroenterology;;   BIOPSY OF SKIN SUBCUTANEOUS TISSUE AND/OR MUCOUS MEMBRANE  12/13/2023   Procedure: BIOPSY, SKIN, SUBCUTANEOUS TISSUE, OR MUCOUS MEMBRANE;  Surgeon: Federico Rosario BROCKS, MD;  Location: [facility];  Service: Gastroenterology;;   BREAST EXCISIONAL BIOPSY Right  09/16/2009   BREAST LUMPECTOMY WITH RADIOACTIVE SEED LOCALIZATION Bilateral 07/09/2021   Procedure: BILATERAL BREAST LUMPECTOMY WITH RADIOACTIVE SEED LOCALIZATION;  Surgeon: [name], MD;  Location: [facility];  Service: General;  Laterality: Bilateral;   BREAST SURGERY     lumpectomy   ESOPHAGOGASTRODUODENOSCOPY N/A 12/13/2023   Procedure: EGD (ESOPHAGOGASTRODUODENOSCOPY);  Surgeon: Federico Rosario BROCKS, MD;  Location[facility];  Service: Gastroenterology;  Laterality: N/A;   ESOPHAGOGASTRODUODENOSCOPY N/A 12/26/2023   Procedure: EGD (ESOPHAGOGASTRODUODENOSCOPY);  Surgeon: Wilhelmenia Aloha Raddle., MD;  Location: [facility];  Service:  Gastroenterology;  Laterality: N/A;   ESOPHAGOGASTRODUODENOSCOPY N/A 12/27/2023   Procedure: EGD (ESOPHAGOGASTRODUODENOSCOPY);  Surgeon: Wilhelmenia Aloha Raddle., MD;  Location: [facility];  Service: Gastroenterology;  Laterality: N/A;   ESOPHAGOGASTRODUODENOSCOPY N/A 03/08/2024   Procedure: EGD (ESOPHAGOGASTRODUODENOSCOPY);  Surgeon: Wilhelmenia Aloha Raddle., MD;  Location: [facility];  Service: Gastroenterology;  Laterality: N/A;   ESOPHAGOGASTRODUODENOSCOPY (EGD) WITH PROPOFOL  N/A 06/29/2022   Procedure: ESOPHAGOGASTRODUODENOSCOPY (EGD) WITH PROPOFOL ;  Surgeon: Legrand Victory LITTIE DOUGLAS, MD;  Location: [facility];  Service: Gastroenterology;  Laterality: N/A;   EUS N/A 12/26/2023   Procedure: ULTRASOUND, UPPER GI TRACT, ENDOSCOPIC;  Surgeon: Wilhelmenia Aloha Raddle., MD;  Location: [facility];  Service: Gastroenterology;  Laterality: N/A;   EUS N/A 12/27/2023   Procedure: ULTRASOUND, UPPER GI TRACT, ENDOSCOPIC;  Surgeon: Wilhelmenia Aloha Raddle., MD;  Location: [facility];  Service: Gastroenterology;  Laterality: N/A;   EUS N/A 03/08/2024   Procedure: ULTRASOUND, UPPER GI TRACT, ENDOSCOPIC;  Surgeon: Wilhelmenia Aloha Raddle., MD;  Location: [facility];  Service: Gastroenterology;  Laterality: N/A;   IR FLUORO PROCEDURE UNLISTED  01/20/2024   IR IMAGING GUIDED PORT INSERTION  02/06/2024   IR REPLC DUODEN/JEJUNO TUBE PERCUT W/FLUORO  02/03/2024   IR REPLC GASTRO/COLONIC TUBE PERCUT W/FLUORO  01/20/2024   IR REPLC GASTRO/COLONIC TUBE PERCUT W/FLUORO  04/12/2024   IR REPLC GASTRO/COLONIC TUBE PERCUT W/FLUORO  05/14/2024   LAPAROSCOPIC INSERTION GASTROSTOMY TUBE N/A 01/10/2024   Procedure: laparoscopic insertion of gastrostomy tube, laparoscopic insertion of jejunostomy tube, upper endoscopy;  Surgeon: Kinsinger, Herlene Righter, MD;  Location: [facility];  Service: General;  Laterality: N/A;  Laproscopic insertion   LAPAROSCOPIC ROUX-EN-Y GASTRIC BYPASS WITH HIATAL HERNIA REPAIR N/A 01/02/2024   Procedure: LAPAROSCOPIC  CREATION GASTRJEJONOSTOMY;  Surgeon: Lyndel Deward PARAS, MD;  Location: [facility];  Service: General;  Laterality: N/A;  GASTROJEJUNOSTOMY   PORT-A-CATH REMOVAL Left 07/09/2021   Procedure: REMOVAL PORT-A-CATH;  Surgeon: [name], MD;  Location: [facility];  Service: General;  Laterality: Left;   PORTACATH PLACEMENT Left 01/26/2021   Procedure: INSERTION PORT-A-CATH;  Surgeon: [name], MD;  Location: [facility];  Service: General;  Laterality: Left;   RADIOACTIVE SEED GUIDED AXILLARY SENTINEL LYMPH NODE Right 07/09/2021   Procedure: RADIOACTIVE SEED GUIDED RIGHT AXILLARY SENTINEL LYMPH NODE DISSECTION;  Surgeon: [name], MD;  Location: [facility];  Service: General;  Laterality: Right;   TUBAL LIGATION      Social History:  reports that she has never smoked. She has never used smokeless tobacco. She reports that she does not currently use alcohol  after a past usage of about 7.0 standard drinks of alcohol  per week. She reports that she does not use drugs.   Allergies  Allergen Reactions   Enhertu  [Fam-Trastuzumab  Deruxtec-Nxki] Other (See Comments)    Headache and chest pain. See progress note from 02/16/2024. Patient able to complete infusion.   Xgeva  [Denosumab ] Other (See Comments)    Severe prolonged hypocalcemia needing  hospitalization    Family History  Problem Relation Age of Onset   Breast cancer Mother        66s   Hypertension Father    Uterine cancer Maternal Aunt    Ovarian cancer Maternal Aunt    Breast cancer Maternal Aunt        49s   Breast cancer Cousin 52   Uterine cancer Cousin 76   Colon polyps Neg Hx    Colon cancer Neg Hx    Esophageal cancer Neg Hx    Stomach cancer Neg Hx     Family history reviewed and not pertinent    Prior to Admission medications   Medication Sig Start Date End Date Taking? Authorizing Provider  oxyCODONE  (ROXICODONE ) 15 MG immediate release tablet Take 1 tablet (15 mg total)  by mouth every 4 (four) hours as needed for Pain for up to 14 days 05/10/24  Yes   calcium  carbonate (OS-CAL - DOSED IN MG OF ELEMENTAL CALCIUM ) 1250 (500 Ca) MG tablet Take 5 tablets (6,250 mg total) by mouth 3 (three) times daily with meals. Patient not taking: Reported on 05/20/2024 04/19/24   Lue Elsie BROCKS, MD  dexamethasone  (DECADRON ) 2 MG tablet Take 1 tablet (2 mg total) by mouth every 8 (eight) hours. Patient not taking: No sig reported 05/04/24   Cheryle Page, MD  DULoxetine  (CYMBALTA ) 30 MG capsule Take 1 capsule (30 mg total) by mouth at bedtime. Patient not taking: Reported on 05/18/2024 05/14/24     ergocalciferol , VITAMIN D2, (DRISDOL ) 200 MCG/ML drops Take 1 mL (8,000 Units total) by mouth daily. Patient not taking: Reported on 05/20/2024 04/19/24   Lue Elsie BROCKS, MD  hydrocortisone  (CORTEF ) 10 MG tablet Take 10 mg by mouth daily. Patient not taking: Reported on 05/20/2024 04/19/24   [provider]  OLANZapine  zydis (ZYPREXA ) 5 MG disintegrating tablet Take 1 tablet (5 mg total) by mouth at bedtime. Patient not taking: No sig reported 04/19/24   Lue Elsie BROCKS, MD  ondansetron  (ZOFRAN -ODT) 4 MG disintegrating tablet Dissolve 1 tablet by mouth every 4 hours as needed for nausea or vomiting Patient not taking: Reported on 05/20/2024 04/25/24     oxyCODONE  (OXYCONTIN ) 80 mg 12 hr tablet Take 1 tablet (80 mg total) by mouth every 12 hours. (Remove fentanyl  patch and start Oxycontin  12 hours later) Patient not taking: Reported on 05/18/2024 05/14/24     pantoprazole  (PROTONIX ) 40 MG tablet Take 1 tablet (40 mg total) by mouth 2 (two) times daily before a meal. Patient not taking: Reported on 05/20/2024 05/04/24 06/03/24  Cheryle Page, MD  scopolamine  (TRANSDERM-SCOP) 1 MG/3DAYS Place 1 patch (1 mg total) onto the skin every 3 (three) days. Patient not taking: Reported on 05/18/2024 05/06/24   Cheryle Page, MD  zinc  oxide 20 % ointment Apply topically  every 12 (twelve) hours. Patient not taking: Reported on 05/20/2024 05/04/24   Cheryle Page, MD     Objective    Physical Exam: Vitals:   05/19/24 2222 05/20/24 0052 05/20/24 0054 05/20/24 0215  BP: 98/82 97/73  96/61  Pulse: 83 75  72  Resp: 18 17  (!) 9  Temp:   98.3 F (36.8 C)   TempSrc:      SpO2: 100% 99%  100%    General: appears to be stated age; alert, oriented Skin: warm, dry, no rash Head:  AT/[place] Mouth:  Oral mucosa membranes appear moist, normal dentition Neck: supple; trachea midline Heart:  RRR; did not appreciate any  M/R/G Lungs: CTAB, did not appreciate any wheezes, rales, or rhonchi Abdomen: + BS; soft, generalized tenderness to palpation, in the absence of guarding, rigidity, or rebound tenderness; G-tube noted Vascular: 2+ pedal pulses b/l; 2+ radial pulses b/l Extremities: no muscle wasting       Labs on Admission: I have personally reviewed following labs and imaging studies  CBC: Recent Labs  Lab 05/16/24 1020 05/19/24 2330  WBC 4.2 5.2  NEUTROABS 3.0 3.6  HGB 8.8* 7.2*  HCT 28.8* 22.8*  MCV 87 85.4  PLT 233 238   Basic Metabolic Panel: Recent Labs  Lab 05/16/24 1020 05/19/24 2330  NA 138 137  K 2.9* 2.8*  CL 98 102  CO2 24 23  GLUCOSE 103* 94  BUN 8 6  CREATININE 0.51* 0.37*  CALCIUM  5.5* 5.5*   GFR: Estimated Creatinine Clearance: 82.3 mL/min (A) (by C-G formula based on SCr of 0.37 mg/dL (L)). Liver Function Tests: Recent Labs  Lab 05/16/24 1020 05/19/24 2330  AST 21 18  ALT 14 18  ALKPHOS 209* 165*  BILITOT 0.5 0.9  PROT 6.0 5.9*  ALBUMIN  3.6* 2.8*   Recent Labs  Lab 05/19/24 2330  LIPASE 29   No results for input(s): AMMONIA in the last 168 hours. Coagulation Profile: No results for input(s): INR, PROTIME in the last 168 hours. Cardiac Enzymes: No results for input(s): CKTOTAL, CKMB, CKMBINDEX, TROPONINI in the last 168 hours. BNP (last 3 results) No results for input(s): PROBNP in the  last 8760 hours. HbA1C: No results for input(s): HGBA1C in the last 72 hours. CBG: No results for input(s): GLUCAP in the last 168 hours. Lipid Profile: No results for input(s): CHOL, HDL, LDLCALC, TRIG, CHOLHDL, LDLDIRECT in the last 72 hours. Thyroid  Function Tests: No results for input(s): TSH, T4TOTAL, FREET4, T3FREE, THYROIDAB in the last 72 hours. Anemia Panel: No results for input(s): VITAMINB12, FOLATE, FERRITIN, TIBC, IRON, RETICCTPCT in the last 72 hours. Urine analysis:    Component Value Date/Time   COLORURINE YELLOW 04/30/2024 0929   APPEARANCEUR CLEAR 04/30/2024 0929   LABSPEC 1.011 04/30/2024 0929   PHURINE 7.0 04/30/2024 0929   GLUCOSEU NEGATIVE 04/30/2024 0929   HGBUR NEGATIVE 04/30/2024 0929   BILIRUBINUR NEGATIVE 04/30/2024 0929   KETONESUR 5 (A) 04/30/2024 0929   PROTEINUR NEGATIVE 04/30/2024 0929   UROBILINOGEN 0.2 11/01/2013 1246   NITRITE NEGATIVE 04/30/2024 0929   LEUKOCYTESUR NEGATIVE 04/30/2024 0929    Radiological Exams on Admission: CT ABDOMEN PELVIS W CONTRAST Result Date: 05/20/2024 EXAM: CT ABDOMEN AND PELVIS WITH CONTRAST 05/20/2024 01:40:59 AM TECHNIQUE: CT of the abdomen and pelvis was performed with the administration of 75 mL of iohexol  (OMNIPAQUE ) 350 MG/ML injection. Multiplanar reformatted images are provided for review. Automated exposure control, iterative reconstruction, and/or weight-based adjustment of the mA/kV was utilized to reduce the radiation dose to as low as reasonably achievable. COMPARISON: Comparison with the 04/29/2024 study. CLINICAL HISTORY: Abdominal pain, acute, nonlocalized; Known stomach cancer, leak around g-tube, worsening generalized abdominal pain. FINDINGS: LOWER CHEST: No acute abnormality. HEPATOBILIARY: The liver is unremarkable. Gallbladder is unremarkable. No biliary ductal dilatation. SPLEEN: No acute abnormality. PANCREAS: No acute abnormality. ADRENAL GLANDS: No acute  abnormality. KIDNEYS, URETERS AND BLADDER: No stones in the kidneys or ureters. No hydronephrosis. No perinephric or periureteral stranding. Urinary bladder is unremarkable. GI AND BOWEL: Postoperative change about the stomach. Similar mild gastric wall thickening at the percutaneous gastrostomy tube insertion site. Colonic diverticulosis without diverticulitis. There is no bowel obstruction. PERITONEUM AND RETROPERITONEUM: No  ascites. No free air. VASCULATURE: Aorta is normal in caliber. LYMPH NODES: No lymphadenopathy. REPRODUCTIVE ORGANS: No acute abnormality. BONES AND SOFT TISSUES: Diffuse sclerotic osseous metastatic disease. Stable chronic compression fracture of L3. No focal soft tissue abnormality. IMPRESSION: 1. No acute findings. 2. Similar appearance of the percutaneous gastrostomy tube without surrounding fluid collection. Associated mild wall thickening at the insertion site is similar. 3. Diffuse sclerotic osseous metastatic disease, similar to prior. Electronically signed by: Norman Gatlin MD 05/20/2024 01:52 AM EST RP Workstation: [id number]      Assessment/Plan   Principal Problem:   Acute generalized abdominal pain Active Problems:   Hypokalemia   Hypocalcemia   Prolonged QT interval   Nausea & vomiting       #) acute on chronic abdominal pain: In the setting of her history of metastatic breast cancer with extensive abdominal metastatic disease associated with chronic abdominal discomfort, the patient reports 2 to 3 days of worsening abdominal discomfort, refractory to outpatient oxycodone  IR, also noting that oral analgesic intervention has been complicated over the last few days due to recurrent nausea/vomiting.  Today CT abdomen/pelvis with contrast shows no evidence of acute intra-abdominal or acute intrapelvic process, with no significant interval changes relative to CT abdomen/pelvis performed on 04/29/2024, including no evidence of new bowel obstruction or any evidence  of bowel perforation or abscess.  Clinically, presentation is also less suggestive of new small bowel obstruction, with the patient reporting that she is continuing to have regular bowel movements, noting that most recent bowel movement occurred on the morning of 05/19/2024, and that she has continued to pass flatus in the interval.   For now, we will pursue improvement in pain control with prn IV Dilaudid , although subsequent initiation of Dilaudid  PCA may be an option for the patient, as further detailed above.  Regarding her nausea/vomiting, antiemetic selection is complicated by the presence of QTc prolongation.  Will pursue prn IV Ativan  for nausea/vomiting as a consequence of this.  Plan: Prn IV Dilaudid .  Incentive spirometry.  As needed IV Ativan  for nausea, as above.  Lactated Ringer 's at 125 cc/h x 12 hours.  Follow-up result urinalysis.  Repeat CMP, CBC in the morning.                     #) Acute on chronic anemia: In the context of a history of anemia of chronic disease with baseline hemoglobin 7.5-9.0, presenting hemoglobin is low relative to his baseline, with presenting value of 7.2 compared to most recent prior value of 8.8 on 05/16/2024.  Her hemoglobin is normocytic/normochromic in nature, and there is no overt evidence of active bleed at this time.  She has previously required PRBC transfusion.  This evening, EDP is ordered transfusion of 1 unit PRBC.  Will pursue further evaluation and trending of hemoglobin level, as outlined below.  Not on any blood thinners as an outpatient.  Plan: Continue with transfusion of 1 unit PRBC, as initiated by EDP.  Add on INR, PTT.  Add on iron studies.  CBC in the morning.  Also order repeat H&H to check around 9 AM for further trending of her hemoglobin level.                        #) Hypokalemia: presenting potassium level noted to be 2.8, down slightly from most recent prior five 2.9 on 05/16/2024.  Suspect  contribution from recent increase in GI losses in the form of increasing  nausea/vomiting over the last few days, concomitant with relative decline in oral intake.  Notable laboratory finding in the setting of evidence of QTc prolongation on presenting EKG this evening.  She is in the process of receiving potassium chloride  40 mill equivalents over 4 hours in the ED.  Plan: monitor on tele.  In addition to the 40 mill colons of IV potassium chloride  that she is currently receiving in the ED, I will add additional colons of IV potassium chloride  to b administered over 2 hours. Add-on serum mag level.  Recheck CMP in the AM.  Further evaluation management of presenting QTc prolongation, as below.                      #) Hypocalcemia: Presenting calcium  level, following adjustment for hypoalbuminemia, noted to be 6.5, she is slightly higher than most recent prior adjusted calcium  level of 5.9 on 05/16/2024.  The patient has received 1 g of IV calcium  gluconate in the ED this evening.  Will check serum magnesium  level and provide additional IV calcium  gluconate as outlined below.  Plan: Add on serum magnesium  level.  Will provide an additional 1 g of IV calcium  gluconate over 1 hour.  Repeat CMP in the morning.                        #) QTc prolongation: Presenting EKG demonstrates QTc of 516 ms. Stone apparently on a medications as an outpatient that would be associated with QTc prolongation.  However, her presenting hypokalemia does predispose her to development of such.  At this time, magnesium  level is pending.  Plan: Monitor on telemetry.  Add-on serum magnesium  level.  Further evaluation management of presenting hypokalemia, as above.  As needed IV Ativan  for nausea for now.         DVT prophylaxis: SCD's   Code Status: Full code Family Communication: none Disposition Plan: Per Rounding Team Consults called: none;  Admission status: obs     I  SPENT GREATER THAN 75  MINUTES IN CLINICAL CARE TIME/MEDICAL DECISION-MAKING IN COMPLETING THIS ADMISSION.      Eva NOVAK Kwesi Sangha DO [facility]  From 7PM - 7AM   05/20/2024, 2:55 AM

## 2024-05-20 NOTE — Progress Notes (Addendum)
 Clarified order for 40 mg of Oxycodone  and reported BP with Dr. Arrien. MD changed order to 20 mg of Oxycodone .

## 2024-05-20 NOTE — Assessment & Plan Note (Signed)
 Patient not using tube.  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 Ostomy bag is attached to collect fluid.  Patient will follow up with wound care as outpatient.

## 2024-05-20 NOTE — Assessment & Plan Note (Signed)
 Poor prognosis with extensive metastatic disease.  Follow up with palliative care with [facility].  Follow up as outpatient

## 2024-05-20 NOTE — Hospital Course (Signed)
 Chelsea Hansen was admitted to the hospital with the working diagnosis of acute on chronic abdominal pain due to metastatic breast cancer.   53 yo female with the past medical history of metastatic breast cancer, chronic abdominal pain, and chronic anemia who presented with abdominal pain.  She is unknown to have metastatic disease to the peritoneum and pancreas. Apparently not candidate for cancer therapy.  Recent hospitalization 11/09 to 05/04/24 for severe hypocalcemia, intractable nausea and vomiting. Patient was discharged on 100 mcg of fentanyl , and as needed oxycodone  15 mg every 4 hrs.  She followed up with Palliative as outpatient, with the plan to transition to oral oxycodone  long acting from fentanyl  patch. The patch was removed but unfortunately she had no oxycodone  long acting available, leading to worsening abdominal pain.  On her initial physical examination her blood pressure was 98/82, HR 75, RR 17 and 02 saturation 99%  Lungs with no wheezing or rhonchi, heart with S1 and S2 present and regular with no gallops, or rubs, abdomen with no distention, tender to palpation, with no guarding, rigidity or rebound, no lower extremity edema.   Na 137, K 2.8 Cl 102 bicarbonate 23 glucose 94 bun 6 cr 0,37  Ca 5.5  Albumin  2,8  ALK P 165  AST 18 ALT 18  Wbc 5.2 hgb 7,2 plt 238   CT abdomen and pelvis with no acute findings.  Similar appearance of the percutaneous gastrostomy tube without surrounding fluid collection. Diffuse sclerotic osseous metastatic disease.   EKG 80 bpm, normal axis, qtc 516, sinus rhythm with no significant ST segment or T wave changes.   Patient was placed on IV analgesics.  11/30 patient placed on low dose long acting oxycodone  12/01 patient tolerated well 20 mg bid oxycodone , but continue to have uncontrolled pain, will increase to 40 mg po bid and continue monitoring.  Intermittent nausea.  12/02 patient is doing well on 40 mg of oxycodone  bid, will plan to continue  to increase to 80 mg bid as recommended per [facility] outpatient palliative care.

## 2024-05-20 NOTE — Assessment & Plan Note (Signed)
 Patient followed up with [facility] palliative care and decision was made to transition to long acting oxycodone  and discontinue fentanyl  patch. Patch was removed few days ago prior to admission but patient has not started oral oxycodone  long acting at home.    Patient was placed on oral oxycodone  IR 15 mg as needed q 4 hrs,  Patient has been tolerating well oxycodone  long acting 40 mg po bid, plan to further increase to 80 mg bid per [facility] palliative care recommendations   Patient with intermittent nausea, continue with as needed antiemetics with metoclopramide .   Caution because prolonged qtc/

## 2024-05-20 NOTE — Progress Notes (Signed)
 Progress Note   Patient: Chelsea Hansen FMW:[id number] DOB: 1971/05/28 DOA: 05/19/2024     0 DOS: the patient was seen and examined on 05/20/2024   Brief hospital course: Mrs. Chavana was admitted to the hospital with the working diagnosis of acute on chronic abdominal pain due to metastatic breast cancer.   53 yo female with the past medical history of metastatic breast cancer, chronic abdominal pain, and chronic anemia who presented with abdominal pain.  She is unknown to have metastatic disease to the peritoneum and pancreas. Apparently not candidate for cancer therapy.  Recent hospitalization 11/09 to 05/04/24 for severe hypocalcemia, intractable nausea and vomiting. Patient was discharged on 100 mcg of fentanyl , and as needed oxycodone  15 mg every 4 hrs.  On her initial physical examination her blood pressure was 98/82, HR 75, RR 17 and 02 saturation 99%  Lungs with no wheezing or rhonchi, heart with S1 and S2 present and regular with no gallops, or rubs, abdomen with no distention, tender to palpation, with no guarding, rigidity or rebound, no lower extremity edema.   Na 137, K 2.8 Cl 102 bicarbonate 23 glucose 94 bun 6 cr 0,37  Ca 5.5  Albumin  2,8  ALK P 165  AST 18 ALT 18  Wbc 5.2 hgb 7,2 plt 238   CT abdomen and pelvis with no acute findings.  Similar appearance of the percutaneous gastrostomy tube without surrounding fluid collection. Diffuse sclerotic osseous metastatic disease.   EKG 80 bpm, normal axis, qtc 516, sinus rhythm with no significant ST segment or T wave changes.   Patient was placed on IV analgesics.   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 Patch was removed few days ago but patient has not started oral oxycodone  yet.   Plan to resume oral oxycodone  IR 15 mg as needed q 4 hrs, and resume long acting oxycodone ,  (because new agent and low blood pressure, will start at a lower dose of 20 mg po bid), she was supposed to start on 80 mg po bid.  Continue close monitoring in the progressive care.   Hypokalemia Hypocalcemia, and hypomagnesemia.   Follow up renal function with serum cr at 0,37, K is up to 3,9 and serum Ca continue low at 5.7  Mg is 1,5 and bicarbonate at 17 with anion gap of 12.  Plan to repeat dose of calcium  gluconate 2 g, plus 4 g mag sulfate and 40 meq Kcl Follow up renal function and electrolytes in am.   Essential hypertension Continue blood pressure monitoring Patient off antihypertensive meds at this point   GERD (gastroesophageal reflux disease) Continue pantoprazole   Malignant neoplasm of upper-outer quadrant of right breast in female, estrogen receptor positive (HCC) Poor prognosis with extensive metastatic disease.  Follow up with palliative care with [facility].  Follow up as outpatient   Gastrostomy tube present [facility]) Patient not using tube.   Protein-calorie malnutrition, moderate Consult nutrition for recommendations         Subjective: Patient with worsening abdominal pain, no nausea or vomiting, no chest pain or dyspnea.   Physical Exam: Vitals:   05/20/24 0550 05/20/24 0606 05/20/24 0806 05/20/24 0916  BP: 104/76 104/76 99/74 109/78  Pulse: 89 89 80 80  Resp: 18 18 18 18   Temp: 98.3 F (36.8 C) 98.3 F (36.8 C) 98 F (36.7 C) 98 F (36.7 C)  TempSrc:  Oral Oral Oral  SpO2:  100% 100% 100%  Weight:       Neurology awake and alert, deconditioned ENT with mild pallor with no icterus Cardiovascular with S1 and S2 present and regular with no gallops, rubs or murmurs Respiratory with no rales or wheezing, no rhonchi  Abdomen no distended, soft, no rebound or guarding, mild tender to palpation  No lower extremity edema   Data Reviewed:    Family Communication: no family at the bedside   Disposition: Status is: Observation The patient remains  OBS appropriate and will d/c before 2 midnights.  Planned Discharge Destination: Home     Author: Elidia Toribio Furnace, MD 05/20/2024 11:03 AM  For on call review [email or web address].

## 2024-05-20 NOTE — Assessment & Plan Note (Signed)
 Continue pantoprazole .

## 2024-05-20 NOTE — ED Notes (Signed)
 Curtistine Albee (son) is requesting to speak to the nurse of the patient [phone]

## 2024-05-20 NOTE — Plan of Care (Signed)

## 2024-05-20 NOTE — ED Notes (Signed)
 Patient transported to CT

## 2024-05-20 NOTE — Assessment & Plan Note (Signed)
 Hyponatremia, Hypocalcemia, and hypomagnesemia.   Renal function with serum cr at 0,43 with K at 3,9 and serum bicarbonate at 23  Na 134 and Mg 2,0  Ca 6.2   Repeat dose of calcium  gluconate 2 g, Follow up electrolytes in am.

## 2024-05-20 NOTE — Assessment & Plan Note (Signed)
Consult nutrition for recommendations.

## 2024-05-20 NOTE — Assessment & Plan Note (Signed)
 Continue blood pressure monitoring Patient off antihypertensive meds at this point

## 2024-05-21 ENCOUNTER — Ambulatory Visit (INDEPENDENT_AMBULATORY_CARE_PROVIDER_SITE_OTHER): Payer: MEDICAID | Admitting: Primary Care

## 2024-05-21 DIAGNOSIS — E876 Hypokalemia: Secondary | ICD-10-CM | POA: Diagnosis not present

## 2024-05-21 DIAGNOSIS — I10 Essential (primary) hypertension: Secondary | ICD-10-CM | POA: Diagnosis not present

## 2024-05-21 DIAGNOSIS — E43 Unspecified severe protein-calorie malnutrition: Secondary | ICD-10-CM | POA: Diagnosis present

## 2024-05-21 DIAGNOSIS — K2100 Gastro-esophageal reflux disease with esophagitis, without bleeding: Secondary | ICD-10-CM | POA: Diagnosis not present

## 2024-05-21 DIAGNOSIS — R109 Unspecified abdominal pain: Secondary | ICD-10-CM | POA: Diagnosis present

## 2024-05-21 DIAGNOSIS — R52 Pain, unspecified: Secondary | ICD-10-CM | POA: Diagnosis present

## 2024-05-21 DIAGNOSIS — C786 Secondary malignant neoplasm of retroperitoneum and peritoneum: Secondary | ICD-10-CM | POA: Diagnosis not present

## 2024-05-21 LAB — TYPE AND SCREEN
ABO/RH(D): B POS
Antibody Screen: NEGATIVE
Unit division: 0

## 2024-05-21 LAB — MAGNESIUM: Magnesium: 2 mg/dL (ref 1.7–2.4)

## 2024-05-21 LAB — COMPREHENSIVE METABOLIC PANEL WITH GFR
ALT: 14 U/L (ref 0–44)
AST: 16 U/L (ref 15–41)
Albumin: 2.4 g/dL — ABNORMAL LOW (ref 3.5–5.0)
Alkaline Phosphatase: 171 U/L — ABNORMAL HIGH (ref 38–126)
Anion gap: 6 (ref 5–15)
BUN: <5 mg/dL — ABNORMAL LOW (ref 6–20)
CO2: 23 mmol/L (ref 22–32)
Calcium: 6.2 mg/dL — CL (ref 8.9–10.3)
Chloride: 105 mmol/L (ref 98–111)
Creatinine, Ser: 0.43 mg/dL — ABNORMAL LOW (ref 0.44–1.00)
GFR, Estimated: >60 mL/min (ref >60)
Glucose, Bld: 96 mg/dL (ref 70–99)
Potassium: 3.9 mmol/L (ref 3.5–5.1)
Sodium: 134 mmol/L — ABNORMAL LOW (ref 135–145)
Total Bilirubin: 0.5 mg/dL (ref 0.0–1.2)
Total Protein: 5.2 g/dL — ABNORMAL LOW (ref 6.5–8.1)

## 2024-05-21 LAB — BPAM RBC
Blood Product Expiration Date: 202512052359
ISSUE DATE / TIME: 202511300512
Unit Type and Rh: 7300

## 2024-05-21 MED ORDER — SODIUM CHLORIDE 0.9 % IV SOLN
12.5000 mg | Freq: Once | INTRAVENOUS | Status: DC
  Filled 2024-05-21: qty 0.5

## 2024-05-21 MED ORDER — METOCLOPRAMIDE HCL 5 MG/ML IJ SOLN
5.0000 mg | Freq: Once | INTRAMUSCULAR | Status: AC
  Administered 2024-05-21: 5 mg via INTRAVENOUS

## 2024-05-21 MED ORDER — METOCLOPRAMIDE HCL 5 MG/ML IJ SOLN
5.0000 mg | INTRAMUSCULAR | Status: DC | PRN
  Administered 2024-05-21 – 2024-05-22 (×3): 5 mg via INTRAVENOUS
  Filled 2024-05-21 (×3): qty 2

## 2024-05-21 MED ORDER — CALCIUM GLUCONATE-NACL 2-0.675 GM/100ML-% IV SOLN
2.0000 g | Freq: Once | INTRAVENOUS | Status: AC
  Administered 2024-05-21: 2000 mg via INTRAVENOUS
  Filled 2024-05-21: qty 100

## 2024-05-21 MED ORDER — OXYCODONE HCL ER 10 MG PO T12A
40.0000 mg | EXTENDED_RELEASE_TABLET | Freq: Two times a day (BID) | ORAL | Status: DC
  Administered 2024-05-21 – 2024-05-22 (×2): 40 mg via ORAL
  Filled 2024-05-21 (×2): qty 4

## 2024-05-21 MED ORDER — OXYCODONE HCL ER 10 MG PO T12A
20.0000 mg | EXTENDED_RELEASE_TABLET | Freq: Once | ORAL | Status: AC
  Administered 2024-05-21: 20 mg via ORAL
  Filled 2024-05-21: qty 2

## 2024-05-21 NOTE — Progress Notes (Addendum)
 Progress Note   Patient: Chelsea Hansen FMW:[id number] DOB: [birth year] DOA: 05/19/2024     0 DOS: the patient was seen and examined on 05/21/2024   Brief hospital course: Chelsea Hansen was admitted to the hospital with the working diagnosis of acute on chronic abdominal pain due to metastatic breast cancer.   53 yo female with the past medical history of metastatic breast cancer, chronic abdominal pain, and chronic anemia who presented with abdominal pain.  She is unknown to have metastatic disease to the peritoneum and pancreas. Apparently not candidate for cancer therapy.  Recent hospitalization 11/09 to 05/04/24 for severe hypocalcemia, intractable nausea and vomiting. Patient was discharged on 100 mcg of fentanyl , and as needed oxycodone  15 mg every 4 hrs.  On her initial physical examination her blood pressure was 98/82, HR 75, RR 17 and 02 saturation 99%  Lungs with no wheezing or rhonchi, heart with S1 and S2 present and regular with no gallops, or rubs, abdomen with no distention, tender to palpation, with no guarding, rigidity or rebound, no lower extremity edema.   Na 137, K 2.8 Cl 102 bicarbonate 23 glucose 94 bun 6 cr 0,37  Ca 5.5  Albumin  2,8  ALK P 165  AST 18 ALT 18  Wbc 5.2 hgb 7,2 plt 238   CT abdomen and pelvis with no acute findings.  Similar appearance of the percutaneous gastrostomy tube without surrounding fluid collection. Diffuse sclerotic osseous metastatic disease.   EKG 80 bpm, normal axis, qtc 516, sinus rhythm with no significant ST segment or T wave changes.   Patient was placed on IV analgesics.  11/30 patient placed on low dose long acting oxycodone  12/01 patient tolerated well 20 mg bid oxycodone , but continue to have uncontrolled pain, will increase to 40 mg po bid and continue monitoring.  Intermittent nausea.   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 Patch was removed few days ago but patient has not started oral oxycodone  yet.   Continue with oral oxycodone  IR 15 mg as needed q 4 hrs, and increased long acting oxycodone , to 40 mg po bid, (she was supposed to start on 80 mg po bid), but will titrate slow to avoid side effects.   Patient with intermittent nausea, continue with as needed antiemetics.  Caution because prolonged qtc/   Hypokalemia Hyponatremia, Hypocalcemia, and hypomagnesemia.   Renal function with serum cr at 0,43 with K at 3,9 and serum bicarbonate at 23  Na 134 and Mg 2,0  Ca 6.2   Repeat dose of calcium  gluconate 2 g, Follow up electrolytes in am.   Essential hypertension Continue blood pressure monitoring Patient off antihypertensive meds at this point   GERD (gastroesophageal reflux disease) Continue pantoprazole   Malignant neoplasm of upper-outer quadrant of right breast in female, estrogen receptor positive (HCC) Poor prognosis with extensive metastatic disease.  Follow up with palliative care with Chelsea Hansen.  Follow up as outpatient   Gastrostomy tube present [facility]) Patient not using tube.   Protein-calorie malnutrition, severe Continue with nutritional supplements.      Subjective: Patient with abdominal pain at 7/10 improved from yesterday but still not at baseline, continue to have intermittent nausea.   Physical Exam: Vitals:   05/21/24 0403 05/21/24 0734 05/21/24 1243 05/21/24 1540  BP: 94/73 97/70 101/70 109/79  Pulse: 73 76 73 86  Resp: 19 19 18  20  Temp: (!) 97.5 F (36.4 C) 98.1 F (36.7 C) 97.6 F (36.4 C)   TempSrc: Oral Oral Oral   SpO2: 100% 100% 100% 100%  Weight:       Neurology awake and alert ENT with mild pallor Cardiovascular with S1 and S2 present and regular with no gallops, rubs or murmurs Respiratory with no rales or wheezing, no rhonchi  Abdomen with mild tender to palpation, soft and not distended, no guarding or  rebound No lower extremity edema   Data Reviewed:    Family Communication: no family at the bedside. I spoke with patient's son over the phone we talked in detail about patient's condition, plan of care and prognosis and all questions were addressed.   Disposition: Status is: Inpatient Remains inpatient appropriate because: adjusting analgesics.   Planned Discharge Destination: Home     Author: Elidia Toribio Furnace, MD 05/21/2024 3:56 PM  For on call review [email or web address].

## 2024-05-21 NOTE — Discharge Instructions (Signed)
 High-Calorie, High-Protein Nutrition Therapy (2021) A high-calorie, high-protein diet has been recommended to you. Your registered dietitian nutritionist (RDN) may have recommended this diet because you are having difficulty eating enough calories throughout the day, you have lost weight, and/or you need to add protein to your diet. Sometimes you may not feel like eating, even if you know the importance of good nutrition. The recommendations in this handout can help you with the following: Regaining your strength and energy Keeping your body healthy Healing and recovering from surgery or illness and fighting infection Tips: Schedule Your Meals and Snacks Several small meals and snacks are often better tolerated and digested than large meals. Strategies Plan to eat 3 meals and 3 snacks daily. Experiment with timing meals to find out when you have a larger appetite. Appetite may be greatest in the morning after not eating all night so you may prefer to eat your larger meals and snacks in the morning and at lunch. Breakfast-type foods are often better tolerated so eat foods such as eggs, pancakes, waffles and cereal for any meal or snack. Carry snacks with you so you are prepared to eat every 2 to 3 hours. Determine what works best for you if your body's cues for feeling hungry or full are not working. Eat a small meal or snack even if you don't feel hungry. Set a timer to remind you when it is time to eat. Take a walk before you eat (with health care provider's approval). Light or moderate physical activity can help you maintain muscle and increase your appetite. Make Eating Enjoyable Taking steps to make the experience enjoyable may help to increase your interest in eating and improve your appetite. Strategies: Eat with others whenever possible. Include your favorite foods to make meals more enjoyable. Try new foods. Save your beverage for the end of the meal so that you have more room for  food before you get full. Add Calories to Your Meals and Snacks Try adding calorie-dense foods so that each bite provides more nutrition. Strategies Drink milk, chocolate milk, soy milk, or smoothies instead of low-calorie beverages such as diet drinks or water. Cook with milk or soy milk instead of water when making dishes such as hot cereal, cocoa, or pudding. Add jelly, jam, honey, butter or margarine to bread and crackers. Add jam or fruit to ice cream and as a topping over cake. Mix dried fruit, nuts, granola, honey, or dry cereal with yogurt or hot cereals. Enjoy snacks such as milkshakes, smoothies, pudding, ice cream, or custard. Blend a fruit smoothie of a banana, frozen berries, milk or soy milk, and 1 tablespoon nonfat powdered milk or protein powder. Add Protein to Your Meals and Snacks Choose at least one protein food at each meal and snack to increase your daily intake. Strategies Add  cup nonfat dry milk powder or protein powder to make a high-protein milk to drink or to use in recipes that call for milk. Vanilla or peppermint extract or unsweetened cocoa powder could help to boost the flavor. Add hard-cooked eggs, leftover meat, grated cheese, canned beans or tofu to noodles, rice, salads, sandwiches, soups, casseroles, pasta, tuna and other mixed dishes. Add powdered milk or protein powder to hot cereals, meatloaf, casseroles, scrambled eggs, sauces, cream soups, and shakes. Add beans and lentils to salads, soups, casseroles, and vegetable dishes. Eat cottage cheese or yogurt, especially Greek yogurt, with fruit as a snack or dessert. Eat peanut or other nut butters on crackers, bread, toast,  waffles, apples, bananas or celery sticks. Add it to milkshakes, smoothies, or desserts. Consider a ready-made protein shake. Your RDN will make recommendations. Add Fats to Your Meals and Snacks Try adding fats to your meals and snacks. Fat provides more calories in fewer bites than  carbohydrate or protein and adds flavors to your foods. Strategies Snack on nuts and seeds or add them to foods like salads, pasta, cereals, yogurt, and ice cream.  Saut or stir-fry vegetables, meats, chicken, fish or tofu in olive or canola oil.  Add olive oil, other vegetable oils, butter or margarine to soups, vegetables, potatoes, cooked cereal, rice, pasta, bread, crackers, pancakes, or waffles. Snack on olives or add to pasta, pizza, or salad. Add avocado or guacamole to your salads, sandwiches, and other entrees. Include fatty fish such as salmon in your weekly meal plan. For general food safety tips, especially for clients with immunocompromised conditions, ask your RDN for the Food Safety Nutrition Therapy handout. Small Meal and Snack Ideas These snacks and meals are recommended when you have to eat but aren't necessarily hungry.  They are good choices because they are high in protein and high in calories.  2 graham crackers 2 tablespoons peanut or other nut butter 1 cup milk 2 slices whole wheat toast topped with:  avocado, mashed Seasoning of your choice   cup Greek yogurt  cup fruit  cup granola 2 deviled egg halves 5 whole wheat crackers  1 cup cream of tomato soup  grilled cheese sandwich 1 toasted waffle topped with: 2 tablespoons peanut or nut butter 1 tablespoon jam  Trail mix made with:  cup nuts  cup dried fruit  cup cold cereal, any variety  cup oatmeal or cream of wheat cereal 1 tablespoon peanut or nut butter  cup diced fruit   High-Calorie, High-Protein Sample 1-Day Menu View Nutrient Info Breakfast 1 egg, scrambled 1 ounce cheddar cheese 1 English muffin, whole wheat 1 tablespoon margarine 1 tablespoon jam  cup orange juice, fortified with calcium and vitamin D  Morning Snack 1 tablespoon peanut butter 1 banana 1 cup 1% milk  Lunch Tuna salad sandwich made with: 2 slices bread, whole wheat 3 ounces tuna mixed with: 1 tablespoon  mayonnaise  cup pudding  Afternoon Snack  cup hummus  cup carrots 1 pita  Evening Meal Enchilada casserole made with: 2 corn tortillas 3 ounces ground beef, cooked  cup black beans, cooked  cup corn, cooked 1 ounce grated cheddar cheese  cup enchilada sauce  avocado, sliced, topping for enchilada 1 tablespoon sour cream, topping for enchilada Salad:  cup lettuce, shredded  cup tomatoes, chopped, for salad 1 tablespoon olive oil and vinegar dressing, for salad  Evening Snack  cup Greek yogurt  cup blueberries  cup granola

## 2024-05-21 NOTE — Assessment & Plan Note (Signed)
 Continue with nutritional supplements.

## 2024-05-21 NOTE — Progress Notes (Signed)
 Initial Nutrition Assessment  DOCUMENTATION CODES:  Severe malnutrition in context of chronic illness  INTERVENTION:  Continue Regular diet as ordered [employer] Essentials TID, each packet mixed with 8 ounces of 2% milk provides 13 grams of protein and 260 calories. Snacks TID between meals High Calorie, High Protein Nutrition Therapy handout added to AVS   NUTRITION DIAGNOSIS:  Severe Malnutrition related to cancer and cancer related treatments, chronic illness as evidenced by severe muscle depletion, percent weight loss, energy intake < or equal to 75% for > or equal to 1 month.  GOAL:  Patient will meet greater than or equal to 90% of their needs  MONITOR:  PO intake, Labs, Weight trends  REASON FOR ASSESSMENT:  Consult Assessment of nutrition requirement/status  ASSESSMENT:  Pt admitted from home with abdominal pain. PMH significant for metastatic breast cancer with widespread abdominal metastasis to pancreas and peritoneum, anemia, G-tube (placed 01/10/24).  On admission, CT abdomen performed. No acute findings.   Patient sitting in bedside recliner at time of visit just post walking with PT. She states that her appetite and oral intake fluctuates as she intermittently experiences abdominal pain, nausea and emesis.   She has been trying to consume 4 small meals per day which varies in items she consumes. She really enjoys fruit. She does not consume protein supplements as she does not enjoy Ensure or Boost. She is amenable to trying carnation instant breakfast. Will also order snacks to continue with recommendation for small, frequent intake.   She has a G-tube which she is currently not using.   She is not taking any vitamins at home. She reports that sometimes she will go 2-3 days without having a bowel movement. Discussed what to look for regarding malabsorption but also may need to consider bowel regimen if constipation occurs.   Meal completions: 11/30: 75%  breakfast, 75% lunch  Patient states that she has lost a significant amount of weight and is really hoping to re-gain. She reports that her weight prior to August was 210 lbs and is now down to 140 lbs.   Review of chart reflects pt last weighed ~210 lbs (95.5 kg) in January. Since 01/27-11/30 pt is noted to have had a weight loss of 33.1% which is clinically significant for time frame.   Medications: protonix  BID  Labs:  Sodium 134 BUN <5 Cr 0.43 Calcium  corrected 7.48 Alkaline phosphatase 171 Albumin  2.4  NUTRITION - FOCUSED PHYSICAL EXAM: Flowsheet Row Most Recent Value  Orbital Region Mild depletion  Upper Arm Region Moderate depletion  Thoracic and Lumbar Region Severe depletion  Buccal Region No depletion  Temple Region Mild depletion  Clavicle Bone Region Severe depletion  Clavicle and Acromion Bone Region Severe depletion  Scapular Bone Region Severe depletion  Dorsal Hand Severe depletion  Patellar Region Severe depletion  Anterior Thigh Region Severe depletion  Posterior Calf Region Moderate depletion  Edema (RD Assessment) None  Hair Reviewed  Eyes Reviewed  Mouth Reviewed  Skin Reviewed  Nails Reviewed    Diet Order:   Diet Order             Diet regular Fluid consistency: Thin  Diet effective now                   EDUCATION NEEDS:   Education needs have been addressed  Skin:  Skin Assessment: Reviewed RN Assessment  Last BM:  via colostomy  Height:   Ht Readings from Last 1 Encounters:  04/29/24 5'  11 (1.803 m)    Weight:   Wt Readings from Last 1 Encounters:  05/20/24 63.7 kg   BMI:  Body mass index is 19.6 kg/m.  Estimated Nutritional Needs:   Kcal:  2000-2200  Protein:  90-105g  Fluid:  >/=2L  Royce Maris, RDN, LDN Clinical Nutrition See [facility] for contact information.

## 2024-05-21 NOTE — Evaluation (Signed)
 Physical Therapy Evaluation Patient Details Name: Chelsea Hansen MRN: [id number] DOB: 1970-08-10 Today's Date: 05/21/2024  History of Present Illness  Patient is a 53 yo female presenting to the ED with malodorous G tube and abdominal pain on 05/19/24. Admitted with carcinomatosis peritonei. PMH includes: metastatic breast cancer with widespread abdominal metastases to the peritoneum, small bowel, status post duodenal obstruction.  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 Pt presents to evaluation with deficits in mobility, strength, activity tolerance, and balance. Pt was able to ambulate utilizing IV pole and no physical assistance given. Pt would benefit from further gait training, and LE strengthening. PT will continue to treat pt while she is admitted. Pt is currently received OPPT; recommending pt continue to receive OPPT at discharge to address remaining mobility deficits and optimize return to PLOF.         If plan is discharge home, recommend the following: Help with stairs or ramp for entrance;Assist for transportation   Can travel by private vehicle        Equipment Recommendations None recommended by PT  Recommendations for Other Services       Functional Status Assessment Patient has had a recent decline in their functional status and demonstrates the ability to make significant improvements in function in a reasonable and predictable amount of time.     Precautions / Restrictions Precautions Precautions: Fall Recall of Precautions/Restrictions: Intact Restrictions Weight Bearing Restrictions Per Provider Order: No      Mobility  Bed Mobility Overal bed mobility: Modified Independent Bed Mobility: Supine to Sit     Supine to sit: Modified  independent (Device/Increase time)     General bed mobility comments: HOB elevated w/ use of railing; increased time to complete.    Transfers Overall transfer level: Needs assistance Equipment used: None Transfers: Sit to/from Stand Sit to Stand: Supervision           General transfer comment: sit to stand from EOB w/out an AD. Pt pushes up from bed with BUE and then transfers hands to IV pole for stability.    Ambulation/Gait Ambulation/Gait assistance: Supervision Gait Distance (Feet): 40 Feet Assistive device: IV Pole Gait Pattern/deviations: Step-through pattern, Decreased stride length, Decreased dorsiflexion - right, Decreased dorsiflexion - left Gait velocity: reduced Gait velocity interpretation: <1.8 ft/sec, indicate of risk for recurrent falls   General Gait Details: Pt demonstrates midfoot strike with reduced ankle DF and knee flexion bilaterally. Pt displays slight ER in bilateral LEs throughout w/ increased reliance on IV pole for stability.  Stairs            Wheelchair Mobility     Tilt Bed    Modified Rankin (Stroke Patients Only)       Balance Overall balance assessment: Needs assistance Sitting-balance support: No upper extremity supported, Feet supported Sitting balance-Leahy Scale: Good Sitting balance - Comments: able to sit EOB and tolerate weight shifting for MMT w/out LOB or postural lean   Standing balance support: During functional activity, No upper extremity supported Standing balance-Leahy Scale: Fair Standing balance comment: able to stand w/out UE support and no LOB noted                             Pertinent Vitals/Pain Pain Assessment Pain  Assessment: No/denies pain Pain Score: 0-No pain Pain Intervention(s): Monitored during session    Home Living Family/patient expects to be discharged to:: Private residence Living Arrangements: Children Available Help at Discharge: Family;Friend(s);Available 24  hours/day Type of Home: Apartment Home Access: Stairs to enter Entrance Stairs-Rails: Right;Left;Can reach both Entrance Stairs-Number of Steps: 7 Alternate Level Stairs-Number of Steps: 12 Home Layout: Two level;1/2 bath on main level;Other (Comment) (bathroom upstairs) Home Equipment: Tub bench;Rolling Walker (2 wheels);Cane - single point      Prior Function Prior Level of Function : History of Falls (last six months)             Mobility Comments: Pt reports independent to mod I depending on the day. Pt uses SPC or RW when feeling weak; which she uses also depends on the day. ADLs Comments: indep     Extremity/Trunk Assessment   Upper Extremity Assessment Upper Extremity Assessment: Defer to OT evaluation    Lower Extremity Assessment Lower Extremity Assessment: Generalized weakness (grossly 4/5)    Cervical / Trunk Assessment Cervical / Trunk Assessment: Normal  Communication   Communication Communication: No apparent difficulties    Cognition Arousal: Alert Behavior During Therapy: WFL for tasks assessed/performed   PT - Cognitive impairments: No apparent impairments                         Following commands: Intact       Cueing Cueing Techniques: Verbal cues     General Comments General comments (skin integrity, edema, etc.): no signs of acute distress    Exercises     Assessment/Plan    PT Assessment Patient needs continued PT services  PT Problem List Decreased strength;Decreased activity tolerance;Decreased mobility       PT Treatment Interventions DME instruction;Gait training;Stair training;Therapeutic activities;Functional mobility training;Therapeutic exercise;Balance training;Patient/family education;Wheelchair mobility training    PT Goals (Current goals can be found in the Care Plan section)  Acute Rehab PT Goals Patient Stated Goal: to get stronger and go home PT Goal Formulation: With patient Time For Goal Achievement:  06/04/24 Potential to Achieve Goals: Good    Frequency Min 2X/week     Co-evaluation               AM-PAC PT 6 Clicks Mobility  Outcome Measure Help needed turning from your back to your side while in a flat bed without using bedrails?: None Help needed moving from lying on your back to sitting on the side of a flat bed without using bedrails?: None Help needed moving to and from a bed to a chair (including a wheelchair)?: A Little Help needed standing up from a chair using your arms (e.g., wheelchair or bedside chair)?: A Little Help needed to walk in hospital room?: A Little Help needed climbing 3-5 steps with a railing? : Total 6 Click Score: 18    End of Session Equipment Utilized During Treatment: Gait belt Activity Tolerance: Patient tolerated treatment well Patient left: in chair;with call bell/phone within reach;with chair alarm set;with nursing/sitter in room Nurse Communication: Mobility status PT Visit Diagnosis: Other abnormalities of gait and mobility (R26.89);Muscle weakness (generalized) (M62.81);History of falling (Z91.81)    Time: 8947-8891 PT Time Calculation (min) (ACUTE ONLY): 16 min   Charges:   PT Evaluation $PT Eval Low Complexity: 1 Low   PT General Charges $$ ACUTE PT VISIT: 1 Visit         Leontine Hilt DPT Acute Rehab Services [phone] Prefer contact  via chat   Atziry Baranski B Delight Bickle 05/21/2024, 11:22 AM

## 2024-05-21 NOTE — Evaluation (Signed)
 Occupational Therapy Evaluation Patient Details Name: Chelsea Hansen MRN: [id number] DOB: March 17, 1971 Today's Date: 05/21/2024   History of Present Illness   Patient is a 53 yo female presenting to the ED with malodorous G tube and abdominal pain on 05/19/24. Admitted with carcinomatosis peritonei. PMH includes: metastatic breast cancer with widespread abdominal metastases to the peritoneum, small bowel, status post duodenal obstruction.  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 Patient with 10/10 pain upon OT entry, but willing to participate. Patient with generalized weakness, fatigue, and decreased activity tolerance, however is at set up level with ADLs and CGA for transfers without AD. Patient declining mobility at the time, however wanting to continue therapy in the hospital and is attending outpatient PT. OT will follow acutely, however patient does not require OT at discharge.   HR 75      If plan is discharge home, recommend the following:   A little help with bathing/dressing/bathroom;A little help with walking and/or transfers;Help with stairs or ramp for entrance;Assist for transportation (initially)     Functional Status Assessment   Patient has had a recent decline in their functional status and demonstrates the ability to make significant improvements in function in a reasonable and predictable amount of time.     Equipment Recommendations   None recommended by OT     Recommendations for Other Services         Precautions/Restrictions   Precautions Precautions: Fall Recall of Precautions/Restrictions: Intact Restrictions Weight Bearing Restrictions Per Provider Order: No     Mobility Bed  Mobility Overal bed mobility: Needs Assistance Bed Mobility: Supine to Sit, Sit to Supine     Supine to sit: Contact guard Sit to supine: Contact guard assist   General bed mobility comments: CGA for safety    Transfers Overall transfer level: Needs assistance Equipment used: None Transfers: Sit to/from Stand Sit to Stand: Contact guard assist           General transfer comment: able to march in place without assist from OT      Balance Overall balance assessment: Mild deficits observed, not formally tested                                         ADL either performed or assessed with clinical judgement   ADL Overall ADL's : Needs assistance/impaired Eating/Feeding: Set up;Sitting   Grooming: Wash/dry hands;Wash/dry face;Set up;Sitting   Upper Body Bathing: Set up;Sitting   Lower Body Bathing: Set up;Sit to/from stand;Sitting/lateral leans   Upper Body Dressing : Set up;Sitting   Lower Body Dressing: Set up;Sitting/lateral leans;Sit to/from stand Lower Body Dressing Details (indicate cue type and reason): socks in long sitting in bed Toilet Transfer: Set up   Toileting- Clothing Manipulation and Hygiene: Set up;Sit to/from stand;Sitting/lateral lean       Functional mobility during ADLs: Set up General ADL Comments: Prior to this admission, patient living with her children, using a cane or a walker when needed, and needing occasional assist with ADLs depending on pain. Patient with 10/10 pain upon OT entry, but willing to participate. Patient with generalized weakness, fatigue, and decreased activity tolerance, however is at  set up level with ADLs and CGA for transfers without AD. Patient declining mobility at the time, however wanting to continue therapy in the hospital and is attending outpatient PT. OT will follow acutely, however patient does not require OT at discharge.     Vision Baseline Vision/History: 1 Wears glasses Ability to See in  Adequate Light: 0 Adequate Patient Visual Report: No change from baseline Vision Assessment?: Wears glasses for reading     Perception Perception: Within Functional Limits       Praxis Praxis: WFL       Pertinent Vitals/Pain Pain Assessment Pain Assessment: 0-10 Pain Score: 10-Worst pain ever Pain Location: stomach Pain Descriptors / Indicators: Constant, Discomfort Pain Intervention(s): Limited activity within patient's tolerance, Monitored during session, Repositioned, Patient requesting pain meds-RN notified     Extremity/Trunk Assessment Upper Extremity Assessment Upper Extremity Assessment: Right hand dominant;Generalized weakness   Lower Extremity Assessment Lower Extremity Assessment: Defer to PT evaluation   Cervical / Trunk Assessment Cervical / Trunk Assessment: Normal   Communication Communication Communication: No apparent difficulties   Cognition Arousal: Alert Behavior During Therapy: WFL for tasks assessed/performed Cognition: No apparent impairments                               Following commands: Intact       Cueing  General Comments   Cueing Techniques: Verbal cues  HR 75 throughout session   Exercises     Shoulder Instructions      Home Living Family/patient expects to be discharged to:: Private residence Living Arrangements: Children Available Help at Discharge: Family;Available PRN/intermittently Type of Home: Apartment Home Access: Stairs to enter Entrance Stairs-Number of Steps: 7 Entrance Stairs-Rails: Right;Left Home Layout: Two level;Able to live on main level with bedroom/bathroom;1/2 bath on main level Alternate Level Stairs-Number of Steps: 12 Alternate Level Stairs-Rails: Right Bathroom Shower/Tub: Tub/shower unit   Bathroom Toilet: Handicapped height     Home Equipment: Tub bench;Rolling Walker (2 wheels);Cane - single point          Prior Functioning/Environment Prior Level of Function : Needs  assist;History of Falls (last six months)             Mobility Comments: uses a cane or a walker when needed, one fall within the last 6 months ADLs Comments: needs occasional assist    OT Problem List: Decreased activity tolerance;Decreased strength;Pain   OT Treatment/Interventions: Self-care/ADL training;Therapeutic exercise;Energy conservation;DME and/or AE instruction;Therapeutic activities;Patient/family education;Balance training      OT Goals(Current goals can be found in the care plan section)   Acute Rehab OT Goals Patient Stated Goal: to get better OT Goal Formulation: With patient Time For Goal Achievement: 06/04/24 Potential to Achieve Goals: Good ADL Goals Pt Will Perform Lower Body Bathing: with modified independence;sitting/lateral leans;sit to/from stand Pt Will Perform Lower Body Dressing: sit to/from stand;sitting/lateral leans;with modified independence Pt Will Transfer to Toilet: with modified independence;ambulating;regular height toilet Pt Will Perform Toileting - Clothing Manipulation and hygiene: with modified independence;sitting/lateral leans;sit to/from stand Pt/caregiver will Perform Home Exercise Program: Increased strength;Both right and left upper extremity;With written HEP provided;With theraband;Independently   OT Frequency:  Min 2X/week    Co-evaluation              AM-PAC OT 6 Clicks Daily Activity     Outcome Measure Help from another person eating meals?: A Little Help from another person taking care of personal grooming?: A Little Help  from another person toileting, which includes using toliet, bedpan, or urinal?: A Little Help from another person bathing (including washing, rinsing, drying)?: A Little Help from another person to put on and taking off regular upper body clothing?: A Little Help from another person to put on and taking off regular lower body clothing?: A Little 6 Click Score: 18   End of Session Nurse  Communication: Mobility status;Patient requests pain meds  Activity Tolerance: Patient tolerated treatment well Patient left: in bed;with call bell/phone within reach  OT Visit Diagnosis: History of falling (Z91.81);Muscle weakness (generalized) (M62.81);Pain                Time: 9153-9096 OT Time Calculation (min): 17 min Charges:  OT General Charges $OT Visit: 1 Visit OT Evaluation $OT Eval Moderate Complexity: 1 Mod  Ronal Gift E. Baani Bober, OTR/L Acute Rehabilitation Services [phone]   Ronal Gift Salt 05/21/2024, 9:18 AM

## 2024-05-21 NOTE — Consult Note (Addendum)
 WOC Nurse ostomy consult note Stoma type/location: LUQ colostomy. Pt known by [facility] team. Requested to reassess due to leaking. Stomal assessment/size: Stoma stenosis less than 05 mm, skin level.  Peristomal assessment: rash S2/T4 according to sacs. Fold at 3 and 9 o'clock.  Patient skin had a translucent appeared, perhaps due to excessive use of Cavilon. Please suspended the use.  Treatment options for stomal/peristomal skin: Powder #6, ring [id number], belt L #621. Output approx. 80 ml in the bag at 0900. Ostomy pouching: 1pc soft convex 1 1/2 #[id number].  Ordered more supplies and belt. Enrolled patient in [employer] DC program: No   WOC team will check the ostomy bag on TUE. Please reconsult if further assistance is needed. Thank-you,  Lela Holm MSN, RN, CNS.  (Phone [phone])

## 2024-05-21 NOTE — Plan of Care (Signed)
  Problem: Education: Goal: Knowledge of General Education information will improve Description: Including pain rating scale, medication(s)/side effects and non-pharmacologic comfort measures Outcome: Progressing   Problem: Health Behavior/Discharge Planning: Goal: Ability to manage health-related needs will improve Outcome: Progressing   Problem: Clinical Measurements: Goal: Will remain free from infection Outcome: Progressing   Problem: Nutrition: Goal: Adequate nutrition will be maintained Outcome: Progressing   Problem: Coping: Goal: Level of anxiety will decrease Outcome: Progressing   Problem: Pain Managment: Goal: General experience of comfort will improve and/or be controlled Outcome: Progressing   Problem: Safety: Goal: Ability to remain free from injury will improve Outcome: Progressing

## 2024-05-21 NOTE — TOC CM/SW Note (Signed)
 Transition of Care [facility]) - Inpatient Brief Assessment   Patient Details  Name: ARIELYS WANDERSEE MRN: [id number] Date of Birth: 1971-02-19  [facility]) CM/SW Contact:    Waddell Barnie Rama, RN Phone Number: 05/21/2024, 11:32 AM   Clinical Narrative: From home with daughter, has PCP and insurance on file, states has no HH services in place at this time, has walker/cane at home.  States  friend will transport them home at [employer] and family is support system, states gets medications from [facility].  Pta self ambulatory.   Patient states she was with [facility]  two weeks ago, but she is no longer with them.  She states that she goes to outpatient physical therapy with the [facility] next week.  Per PT eval rec OP PT.     Transition of Care Asessment: Insurance and Status: Insurance coverage has been reviewed Patient has primary care physician: Yes Home environment has been reviewed: home with daughter Prior level of function:: independent Prior/Current Home Services: Current home services (walker/cane) Social Drivers of Health Review: SDOH reviewed no interventions necessary Readmission risk has been reviewed: Yes Transition of care needs: no transition of care needs at this time

## 2024-05-22 ENCOUNTER — Other Ambulatory Visit (HOSPITAL_COMMUNITY): Payer: Self-pay

## 2024-05-22 DIAGNOSIS — C786 Secondary malignant neoplasm of retroperitoneum and peritoneum: Secondary | ICD-10-CM | POA: Diagnosis not present

## 2024-05-22 DIAGNOSIS — E876 Hypokalemia: Secondary | ICD-10-CM | POA: Diagnosis not present

## 2024-05-22 DIAGNOSIS — I10 Essential (primary) hypertension: Secondary | ICD-10-CM | POA: Diagnosis not present

## 2024-05-22 DIAGNOSIS — K2100 Gastro-esophageal reflux disease with esophagitis, without bleeding: Secondary | ICD-10-CM | POA: Diagnosis not present

## 2024-05-22 LAB — COMPREHENSIVE METABOLIC PANEL WITH GFR
ALT: 16 U/L (ref 0–44)
AST: 16 U/L (ref 15–41)
Albumin: 2.6 g/dL — ABNORMAL LOW (ref 3.5–5.0)
Alkaline Phosphatase: 180 U/L — ABNORMAL HIGH (ref 38–126)
Anion gap: 5 (ref 5–15)
BUN: 5 mg/dL — ABNORMAL LOW (ref 6–20)
CO2: 25 mmol/L (ref 22–32)
Calcium: 6.6 mg/dL — ABNORMAL LOW (ref 8.9–10.3)
Chloride: 105 mmol/L (ref 98–111)
Creatinine, Ser: 0.5 mg/dL (ref 0.44–1.00)
GFR, Estimated: >60 mL/min (ref >60)
Glucose, Bld: 89 mg/dL (ref 70–99)
Potassium: 4.3 mmol/L (ref 3.5–5.1)
Sodium: 135 mmol/L (ref 135–145)
Total Bilirubin: 0.5 mg/dL (ref 0.0–1.2)
Total Protein: 5.5 g/dL — ABNORMAL LOW (ref 6.5–8.1)

## 2024-05-22 LAB — MAGNESIUM: Magnesium: 1.8 mg/dL (ref 1.7–2.4)

## 2024-05-22 MED ORDER — METOCLOPRAMIDE HCL 5 MG PO TABS
5.0000 mg | ORAL_TABLET | Freq: Four times a day (QID) | ORAL | 0 refills | Status: AC | PRN
  Filled 2024-05-22: qty 30, 8d supply, fill #0

## 2024-05-22 MED ORDER — ERGOCALCIFEROL 200 MCG/ML PO SOLN
8000.0000 [IU] | Freq: Every day | ORAL | 0 refills | Status: AC
  Filled 2024-05-22: qty 30, 30d supply, fill #0

## 2024-05-22 MED ORDER — METOCLOPRAMIDE HCL 5 MG PO TABS: 5.0000 mg | ORAL_TABLET | Freq: Four times a day (QID) | ORAL | Status: DC | PRN

## 2024-05-22 MED ORDER — CALCIUM CARBONATE 1250 (500 CA) MG PO TABS
6250.0000 mg | ORAL_TABLET | Freq: Three times a day (TID) | ORAL | 0 refills | Status: AC
  Filled 2024-05-22: qty 450, 30d supply, fill #0

## 2024-05-22 MED ORDER — ACETAMINOPHEN 325 MG PO TABS: 650.0000 mg | ORAL_TABLET | Freq: Four times a day (QID) | ORAL | Status: AC | PRN

## 2024-05-22 MED ORDER — HEPARIN SOD (PORK) LOCK FLUSH 100 UNIT/ML IV SOLN
500.0000 [IU] | INTRAVENOUS | Status: AC | PRN
  Administered 2024-05-22: 500 [IU]

## 2024-05-22 MED ORDER — PANTOPRAZOLE SODIUM 40 MG PO TBEC
40.0000 mg | DELAYED_RELEASE_TABLET | Freq: Every day | ORAL | 0 refills | Status: DC
  Filled 2024-05-22: qty 30, 30d supply, fill #0

## 2024-05-22 NOTE — Plan of Care (Signed)
   Problem: Clinical Measurements: Goal: Diagnostic test results will improve Outcome: Progressing   Problem: Nutrition: Goal: Adequate nutrition will be maintained Outcome: Progressing

## 2024-05-22 NOTE — Consult Note (Signed)
 WOC Nurse wound follow up Wound type: peri-stomal (old Gtube site) denudation; existing Gtube site with hypergranulation and excessive drainage from this tissue Measurement: NA Wound bed: Old Gtube site is covered with pouch; added barrier extender to the right medial aspect of the tape border. It is closest to the new gtube that has excessive drainage and this causes the tape border to turn loose. Pouch has gastric yellow/green in the pouch, no leakage from the outside of the tape border left lateral or distal aspect of the tape border Existing Gtube site; large area of bulbous hypergranulation tissue from excessive moisture, does not seem to be gastric contents more like excessive drainage from the hypergranulation tissue  Drainage (amount, consistency, odor) see above Periwound: intact at existing Gtube site (with active tube placement). Did not assess the old gtube site as pouch was intact and secure Dressing procedure/placement/frequency: Cleanse active Gtube site with saline, pat dry Cut Drawtex in 1/2 and then like drain sponge and place around tube and over hypergranulation tissue.  Cover with 3x3 silicone foam cut like drain sponge.  Taught patient how to do this.    Old Gtube site Cleanse around site with water  when pouch changed. Apply 2 barrier ring and soft convex pouch, use belt to apply tension at 3 and 9 oclock. Use barrier extender on the medial aspect of the tape border of the pouch if leakage from the active drain site is an issue.   Provided patient with 7 pouches and barrier rings, belt.  Drawtex, 3x3 foam.    Also provided patient with 5 convatec 2pc pouches for the old gtube site, donated supplies.    Because old g-tube site is not a bowel or urinary stoma, ostomy pouches will not be covered by insurance. Patient is aware of this. Provided [employer] catelog and marked items she is using for her.  Provided ostomy clinic information as well.   Margarita Croke [facility],  CNS, CWON-AP [phone]

## 2024-05-22 NOTE — Progress Notes (Signed)
 Mobility Specialist Progress Note:    05/22/24 0958  Mobility  Activity Ambulated with assistance  Level of Assistance Standby assist, set-up cues, supervision of patient - no hands on  Assistive Device None  Distance Ambulated (ft) 60 ft  Range of Motion/Exercises Active  Activity Response Tolerated fair  Mobility Referral Yes  Mobility visit 1 Mobility  Mobility Specialist Start Time (ACUTE ONLY) [id number]  Mobility Specialist Stop Time (ACUTE ONLY) 1008  Mobility Specialist Time Calculation (min) (ACUTE ONLY) 10 min   Received pt laying in bed agreeable to session. No c/o any symptoms but pt seemed like she may have a little pain when ambulating. Pt moving and ambulating decently. Returned pt to bed w/ all needs met.   Venetia Keel Mobility Specialist Please Neurosurgeon or Rehab Office at [phone]

## 2024-05-22 NOTE — Progress Notes (Signed)
 Discharge   Patient expressed verbal understanding of discharge POC.   Patient given time to ask any questions.  Additional education included in AVS.  Alert oriented in good spirits.   Tele removed CCMD/Dwayne.   Port line discontinued by IV team. Bandaid to site intact.  Patient to be discharged to lounge waiting for [facility] meds and ride.

## 2024-05-22 NOTE — TOC Transition Note (Signed)
 Transition of Care [facility]) - Discharge Note   Patient Details  Name: Chelsea Hansen MRN: [id number] Date of Birth: 1970/11/07  Transition of Care [facility]) CM/SW Contact:  Waddell Barnie Rama, RN Phone Number: 05/22/2024, 11:07 AM   Clinical Narrative:    For dc today, she states she has transportation.  She also is getting chemo therapy at [facility] per Patient.  She states that she goes to outpatient physical therapy with the [facility] next week.  Melody with WOC gave her some pouches and patient is to call Darice if she needs any more , Melody gave her the phone number.         Patient Goals and CMS Choice            Discharge Placement                       Discharge Plan and Services Additional resources added to the After Visit Summary for                                       Social Drivers of Health (SDOH) Interventions SDOH Screenings   Food Insecurity: No Food Insecurity (05/20/2024)  Housing: Low Risk  (05/20/2024)  Transportation Needs: No Transportation Needs (05/20/2024)  Utilities: Not At Risk (05/20/2024)  Depression (PHQ2-9): Medium Risk (03/30/2024)  Financial Resource Strain: High Risk (02/13/2024)  Physical Activity: Inactive (02/13/2024)  Social Connections: Moderately Isolated (05/20/2024)  Stress: No Stress Concern Present (02/13/2024)  Tobacco Use: Low Risk  (05/20/2024)  Recent Concern: Tobacco Use - Medium Risk (05/14/2024)   Received from [facility]  Health Literacy: Adequate Health Literacy (02/13/2024)     Readmission Risk Interventions    05/02/2024   11:56 AM 04/30/2024   11:25 AM 03/20/2024    3:17 PM  Readmission Risk Prevention Plan  Transportation Screening Complete Complete   Medication Review (RN Care Manager) Complete Complete Complete  PCP or Specialist appointment within 3-5 days of discharge Complete  Complete  HRI or Home Care Consult Complete Complete Complete  SW Recovery  Care/Counseling Consult Complete Complete Complete  Palliative Care Screening Complete Not Applicable Not Applicable  Skilled Nursing Facility Not Applicable Not Applicable Not Applicable

## 2024-05-22 NOTE — Discharge Summary (Signed)
 Physician Discharge Summary   Patient: Chelsea Hansen MRN: [id number] DOB: Jul 11, 1970  Admit date:     05/19/2024  Discharge date: 05/22/24  Discharge Physician: Chelsea Hansen   PCP: Chelsea Chelsea SQUIBB, NP   Recommendations at discharge:    Patient will continue pain control with oxycodone  80 mg po bid and short acting oxycodone  15 mg as needed every 4 hrs  Continue pantoprazole  and for nausea as needed metoclopramide .  Refilled calcium  supplements and vitamin D  Follow up renal function and electrolytes in 7 days as outpatient  Follow up with wound care as outpatient, (abdominal gastric cutaneous trach from prior gastric tube) Follow up with Chelsea Celestia NP in 7 to 10 days Follow up with Palliative Care as outpatient.   Discharge Diagnoses: Principal Problem:   Carcinomatosis peritonei (HCC) Active Problems:   Hypokalemia   Essential hypertension   GERD (gastroesophageal reflux disease)   Malignant neoplasm of upper-outer quadrant of right breast in female, estrogen receptor positive (HCC)   Gastrostomy tube present (HCC)   Protein-calorie malnutrition, severe   Abdominal pain   Intractable pain  Resolved Problems:   * No resolved hospital problems. [facility] Course: Chelsea Hansen was admitted to the hospital with the working diagnosis of acute on chronic abdominal pain due to metastatic breast cancer.   53 yo female with the past medical history of metastatic breast cancer, chronic abdominal pain, and chronic anemia who presented with abdominal pain.  She is unknown to have metastatic disease to the peritoneum and pancreas. Apparently not candidate for cancer therapy.  Recent hospitalization 11/09 to 05/04/24 for severe hypocalcemia, intractable nausea and vomiting. Patient was discharged on 100 mcg of fentanyl , and as needed oxycodone  15 mg every 4 hrs.  She followed up with Palliative as outpatient, with the plan to transition to oral oxycodone  long acting from  fentanyl  patch. The patch was removed but unfortunately she had no oxycodone  long acting available, leading to worsening abdominal pain.  On her initial physical examination her blood pressure was 98/82, HR 75, RR 17 and 02 saturation 99%  Lungs with no wheezing or rhonchi, heart with S1 and S2 present and regular with no gallops, or rubs, abdomen with no distention, tender to palpation, with no guarding, rigidity or rebound, no lower extremity edema.   Na 137, K 2.8 Cl 102 bicarbonate 23 glucose 94 bun 6 cr 0,37  Ca 5.5  Albumin  2,8  ALK P 165  AST 18 ALT 18  Wbc 5.2 hgb 7,2 plt 238   CT abdomen and pelvis with no acute findings.  Similar appearance of the percutaneous gastrostomy tube without surrounding fluid collection. Diffuse sclerotic osseous metastatic disease.   EKG 80 bpm, normal axis, qtc 516, sinus rhythm with no significant ST segment or T wave changes.   Patient was placed on IV analgesics.  11/30 patient placed on low dose long acting oxycodone  12/01 patient tolerated well 20 mg bid oxycodone , but continue to have uncontrolled pain, will increase to 40 mg po bid and continue monitoring.  Intermittent nausea.  12/02 patient is doing well on 40 mg of oxycodone  bid, will plan to continue to increase to 80 mg bid as recommended per Chelsea Hansen outpatient palliative care.   Assessment and Plan: * Carcinomatosis peritonei [facility]) Patient followed up with Chelsea Hansen palliative care and decision was made to transition to long acting oxycodone  and discontinue fentanyl  patch. Patch was removed few days ago prior to admission but patient has not started oral  oxycodone  long acting at home.    Patient was placed on oral oxycodone  IR 15 mg as needed q 4 hrs,  Patient has been tolerating well oxycodone  long acting 40 mg po bid, plan to further increase to 80 mg bid per Chelsea Hansen palliative care recommendations   Patient with intermittent nausea, continue with as needed antiemetics with metoclopramide .    Caution because prolonged qtc/   Hypokalemia Hyponatremia, Hypocalcemia, and hypomagnesemia.   Electrolytes were corrected, at the time of her discharge her renal function had a serum cr of 0.50 with K at 4,3 and serum bicarbonate at 25  Na 135  and Mg 1.8   Follow up renal function and electrolytes as outpatient. Patient is tolerating po well.  Continue oral calcium  and vitamin D  as outpatient   Essential hypertension Continue blood pressure monitoring Patient off antihypertensive meds at this point   GERD (gastroesophageal reflux disease) Continue pantoprazole   Malignant neoplasm of upper-outer quadrant of right breast in female, estrogen receptor positive (HCC) Poor prognosis with extensive metastatic disease.  Follow up with palliative care with Chelsea Hansen.  Follow up as outpatient   Gastrostomy tube present [facility]) Patient not using tube.  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 Ostomy bag is attached to collect fluid.  Patient will follow up with wound care as outpatient.   Protein-calorie malnutrition, severe Continue with nutritional supplements.         Consultants: none  Procedures performed: none   Disposition: Home Diet recommendation:  Regular diet DISCHARGE MEDICATION: Allergies as of 05/22/2024       Reactions   Enhertu  [fam-trastuzumab  Deruxtec-nxki] Other (See Comments)   Headache and chest pain. See progress note from 02/16/2024. Patient able to complete infusion.   Xgeva  [denosumab ] Other (See Comments)   Severe prolonged hypocalcemia needing hospitalization        Medication List     STOP taking these medications    dexamethasone  2 MG tablet Commonly known as: DECADRON    DULoxetine  30 MG capsule Commonly known as: CYMBALTA    hydrocortisone  10 MG tablet Commonly known as: CORTEF    OLANZapine  zydis 5 MG disintegrating tablet Commonly known as: ZYPREXA    ondansetron   4 MG disintegrating tablet Commonly known as: ZOFRAN -ODT   zinc  oxide 20 % ointment       TAKE these medications    acetaminophen  325 MG tablet Commonly known as: TYLENOL  Place 2 tablets (650 mg total) into feeding tube every 6 (six) hours as needed for mild pain (pain score 1-3) or moderate pain (pain score 4-6) (or Fever >/= 101).   calcium  carbonate 1250 (500 Ca) MG tablet Commonly known as: OS-CAL - dosed in mg of elemental calcium  Take 5 tablets (6,250 mg total) by mouth 3 (three) times daily with meals.   ergocalciferol  (VITAMIN D2) 200 MCG/ML drops Commonly known as: DRISDOL  Take 1 mL (8,000 Units total) by mouth daily.   metoCLOPramide  5 MG tablet Commonly known as: REGLAN  Take 1 tablet (5 mg total) by mouth every 6 (six) hours as needed for nausea or vomiting.   oxyCODONE  15 MG immediate release tablet Commonly known as: ROXICODONE  Take 1 tablet (15 mg total) by mouth every 4 (four) hours as needed for Pain for up to 14 days   OxyCONTIN  80 MG 12 hr tablet Generic drug: oxyCODONE  Take 1 tablet (80 mg total) by mouth every 12 hours. (Remove fentanyl  patch and start Oxycontin  12 hours later)  pantoprazole  40 MG tablet Commonly known as: Protonix  Take 1 tablet (40 mg total) by mouth daily.   scopolamine  1 MG/3DAYS Commonly known as: TRANSDERM-SCOP Place 1 patch (1 mg total) onto the skin every 3 (three) days.        Discharge Exam: Filed Weights   05/20/24 0500 05/21/24 0500 05/22/24 0449  Weight: 63.7 kg 64.3 kg 64.3 kg   BP 102/74 (BP Location: Right Arm)   Pulse 84   Temp 97.8 F (36.6 C) (Oral)   Resp 20   Wt 64.3 kg   SpO2 100%   BMI 19.77 kg/m   Patient is feeling better, no further nausea or vomiting and abdominal pain has improved.  Neurology awake and alert ENT with mild pallor with no icterus Cardiovascular with S1 and S2 present and regular with no gallops or rubs Respiratory with no wheezing or rales, no rhonchi  Abdomen with no  distention, non tender to superficial palpation, she has a peg tube in place and a trach from prior gastric tube in place, ostomy bag attached.  No lower extremity edema.   Condition at discharge: stable  The results of significant diagnostics from this hospitalization (including imaging, microbiology, ancillary and laboratory) are listed below for reference.   Imaging Studies: CT ABDOMEN PELVIS W CONTRAST Result Date: 05/20/2024 EXAM: CT ABDOMEN AND PELVIS WITH CONTRAST 05/20/2024 01:40:59 AM TECHNIQUE: CT of the abdomen and pelvis was performed with the administration of 75 mL of iohexol  (OMNIPAQUE ) 350 MG/ML injection. Multiplanar reformatted images are provided for review. Automated exposure control, iterative reconstruction, and/or weight-based adjustment of the mA/kV was utilized to reduce the radiation dose to as low as reasonably achievable. COMPARISON: Comparison with the 04/29/2024 study. CLINICAL HISTORY: Abdominal pain, acute, nonlocalized; Known stomach cancer, leak around g-tube, worsening generalized abdominal pain. FINDINGS: LOWER CHEST: No acute abnormality. HEPATOBILIARY: The liver is unremarkable. Gallbladder is unremarkable. No biliary ductal dilatation. SPLEEN: No acute abnormality. PANCREAS: No acute abnormality. ADRENAL GLANDS: No acute abnormality. KIDNEYS, URETERS AND BLADDER: No stones in the kidneys or ureters. No hydronephrosis. No perinephric or periureteral stranding. Urinary bladder is unremarkable. GI AND BOWEL: Postoperative change about the stomach. Similar mild gastric wall thickening at the percutaneous gastrostomy tube insertion site. Colonic diverticulosis without diverticulitis. There is no bowel obstruction. PERITONEUM AND RETROPERITONEUM: No ascites. No free air. VASCULATURE: Aorta is normal in caliber. LYMPH NODES: No lymphadenopathy. REPRODUCTIVE ORGANS: No acute abnormality. BONES AND SOFT TISSUES: Diffuse sclerotic osseous metastatic disease. Stable chronic  compression fracture of L3. No focal soft tissue abnormality. IMPRESSION: 1. No acute findings. 2. Similar appearance of the percutaneous gastrostomy tube without surrounding fluid collection. Associated mild wall thickening at the insertion site is similar. 3. Diffuse sclerotic osseous metastatic disease, similar to prior. 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 some leaking around the tube. 1 of the caps for the external portion of the tube is no longer secure. Exchange is indicated. EXAM: GASTROSTOMY TUBE EXCHANGE UNDER FLUOROSCOPY MEDICATIONS: No periprocedural antibiotics were indicated. ANESTHESIA/SEDATION: Viscous lidocaine  topical CONTRAST:  5 mL water -soluble-administered into the gastric lumen. FLUOROSCOPY: Radiation Exposure Index (as provided by the fluoroscopic device): 0.3 mGy Kerma COMPLICATIONS: None immediate. PROCEDURE: Informed written consent was obtained from the patient after a thorough discussion of the procedural risks, benefits  and alternatives. All questions were addressed. Maximal Sterile Barrier Technique was utilized including caps, mask, sterile gowns, sterile gloves, sterile drape, hand hygiene and skin antiseptic. A timeout was performed prior to the initiation of the procedure. The previously placed catheter retention balloon was deflated. The catheter was removed a new 30 French balloon retention single-lumen jejunostomy catheter was advanced into the lumen of the decompressed stomach. The retention balloon was inflated with 5 mL sterile saline. Small contrast injection confirms appropriate positioning. The external bumper was applied. Catheter was flushed. The patient  tolerated the procedure well. IMPRESSION: Technically successful exchange of 30 French gastrostomy catheter, okay for routine use. Electronically Signed   By: JONETTA Faes M.D.   On: 05/14/2024 17:11   CT ABDOMEN PELVIS W CONTRAST Result Date: 04/29/2024 EXAM: CT ABDOMEN AND PELVIS WITH CONTRAST 04/29/2024 05:42:44 PM TECHNIQUE: CT of the abdomen and pelvis was performed with the administration of 100 mL of iohexol  (OMNIPAQUE ) 300 MG/ML solution. Multiplanar reformatted images are provided for review. Automated exposure control, iterative reconstruction, and/or weight-based adjustment of the mA/kV was utilized to reduce the radiation dose to as low as reasonably achievable. COMPARISON: Chest abdomen and pelvis 04/09/2024 CLINICAL HISTORY: Sepsis; ab pain, pururlent drainage from G tube. Patient has hx of omental mets. FINDINGS: LOWER CHEST: There is no patchy atelectasis/airspace disease in the lingula. LIVER: Small cysts are present measuring up to 12 mm. GALLBLADDER AND BILE DUCTS: Gallbladder is unremarkable. No biliary ductal dilatation. SPLEEN: No acute abnormality. PANCREAS: No acute abnormality. ADRENAL GLANDS: No acute abnormality. KIDNEYS, URETERS AND BLADDER: There is mild right-sided hydroureteronephrosis without obstructing calculus identified. No stones in the kidneys or ureters. No perinephric or periureteral stranding. Urinary bladder is unremarkable. GI AND BOWEL: Percutaneous gastrostomy tube is in the body of the stomach. There is no fluid collection or significant inflammation surrounding the PEG tube. There is some mild gastric wall thickening at the PEG tube insertion site. There is diffuse colonic diverticulosis. There are scattered colonic diverticula. There is a large amount of stool in the rectosigmoid colon. The appendix appears normal. There is no bowel obstruction. PERITONEUM AND RETROPERITONEUM: No ascites. No free air. No discrete peritoneal nodules are identified. VASCULATURE: Aorta  is normal in caliber. LYMPH NODES: No lymphadenopathy. REPRODUCTIVE ORGANS: No acute abnormality. BONES AND SOFT TISSUES: Diffuse sclerotic osseous metastatic disease again noted. There is stable chronic compression deformity of L3. No acute osseous abnormality. No focal soft tissue abnormality. IMPRESSION: 1. Mild right-sided hydroureteronephrosis without obstructing calculus identified. 2. Diffuse colonic diverticulosis without evidence of diverticulitis. Large amount of stool in the rectosigmoid colon. 3. Percutaneous gastrostomy tube in appropriate position without surrounding fluid collection; mild gastric wall thickening at the insertion site, likely reactive. 4. Diffuse sclerotic osseous metastatic disease again noted; stable chronic compression deformity of L3. Electronically signed by: Greig Pique MD 04/29/2024 06:10 PM EST RP Workstation: [id number]   DG Chest Port 1 View Result Date: 04/29/2024 EXAM: 1 VIEW(S) XRAY OF THE CHEST 04/29/2024 03:34:00 PM COMPARISON: Chest x-ray 02/21/2024. CLINICAL HISTORY: fever, vomiting FINDINGS: LINES, TUBES AND DEVICES: Right chest port catheter tip in the distal SVC, unchanged. LUNGS AND PLEURA: No focal pulmonary opacity. No pulmonary edema. No pleural effusion. No pneumothorax. HEART AND MEDIASTINUM: No acute abnormality of the cardiac and mediastinal silhouettes. BONES AND SOFT TISSUES: No acute osseous abnormality. IMPRESSION: 1. No acute process. 2. Right chest port catheter tip in the distal SVC, unchanged. Electronically signed by: Greig Pique MD 04/29/2024 03:52 PM EST RP Workstation: [id number]  Microbiology: Results for orders placed or performed during the hospital encounter of 04/29/24  Blood culture (routine x 2)     Status: None   Collection Time: 04/29/24  3:05 PM   Specimen: BLOOD  Result Value Ref Range Status   Specimen Description   Final    BLOOD BLOOD RIGHT HAND Performed at [facility], [address]  [place], [place] [address]    Special Requests   Final    BOTTLES DRAWN AEROBIC ONLY Blood Culture results may not be optimal due to an inadequate volume of blood received in culture bottles Performed at [facility], [address] [place], [place] [address]    Culture   Final    NO GROWTH 5 DAYS Performed at [facility], [address] [place], [place] [address]    Report Status 05/04/2024 FINAL  Final  Blood culture (routine x 2)     Status: None   Collection Time: 04/29/24  3:20 PM   Specimen: BLOOD  Result Value Ref Range Status   Specimen Description   Final    BLOOD CHEST RIGHT PORTA CATH Performed at [facility], [address] [place], [place] [address]    Special Requests   Final    BOTTLES DRAWN AEROBIC AND ANAEROBIC Blood Culture adequate volume Performed at [facility], [address], [place], [place] [address]    Culture   Final    NO GROWTH 5 DAYS Performed at [facility], [address], [place], [place] [address]    Report Status 05/04/2024 FINAL  Final   *Note: Due to a large number of results and/or encounters for the requested time period, some results have not been displayed. A complete set of results can be found in Results Review.    Labs: CBC: Recent Labs  Lab 05/16/24 1020 05/19/24 2330 05/20/24 1018  WBC 4.2 5.2 5.3  NEUTROABS 3.0 3.6 3.9  HGB 8.8* 7.2* 9.7*  HCT 28.8* 22.8* 30.8*  MCV 87 85.4 84.4  PLT 233 238 215   Basic Metabolic Panel: Recent Labs  Lab 05/16/24 1020 05/19/24 2330 05/20/24 1133 05/21/24 0547 05/22/24 0548  NA 138 137 136 134* 135  K 2.9* 2.8* 3.9 3.9 4.3  CL 98 102 107 105 105  CO2 24 23 17* 23 25  GLUCOSE 103* 94 100* 96 89  BUN 8 6 6  <5* 5*  CREATININE 0.51* 0.37* 0.37* 0.43* 0.50  CALCIUM  5.5* 5.5* 5.7* 6.2* 6.6*  MG  --   --  1.5* 2.0 1.8   Liver Function Tests: Recent Labs  Lab 05/16/24 1020 05/19/24 2330 05/20/24 1133  05/21/24 0547 05/22/24 0548  AST 21 18 18 16 16   ALT 14 18 15 14 16   ALKPHOS 209* 165* 175* 171* 180*  BILITOT 0.5 0.9 1.2 0.5 0.5  PROT 6.0 5.9* 5.3* 5.2* 5.5*  ALBUMIN  3.6* 2.8* 2.6* 2.4* 2.6*   CBG: No results for input(s): GLUCAP in the last 168 hours.  Discharge time spent: greater than 30 minutes.  Signed: Elidia Toribio Furnace, MD [facility] 05/22/2024

## 2024-05-23 ENCOUNTER — Encounter: Payer: Self-pay | Admitting: General Practice

## 2024-05-23 ENCOUNTER — Telehealth: Payer: Self-pay

## 2024-05-23 ENCOUNTER — Other Ambulatory Visit (HOSPITAL_COMMUNITY): Payer: Self-pay

## 2024-05-23 LAB — CMP14+EGFR
ALT: 14 IU/L (ref 0–32)
AST: 21 IU/L (ref 0–40)
Albumin: 3.6 g/dL — ABNORMAL LOW (ref 3.8–4.9)
Alkaline Phosphatase: 209 IU/L — ABNORMAL HIGH (ref 49–135)
BUN/Creatinine Ratio: 16 (ref 9–23)
BUN: 8 mg/dL (ref 6–24)
Bilirubin Total: 0.5 mg/dL (ref 0.0–1.2)
CO2: 24 mmol/L (ref 20–29)
Calcium: 5.5 mg/dL — CL (ref 8.7–10.2)
Chloride: 98 mmol/L (ref 96–106)
Creatinine, Ser: 0.51 mg/dL — ABNORMAL LOW (ref 0.57–1.00)
Globulin, Total: 2.4 g/dL (ref 1.5–4.5)
Glucose: 103 mg/dL — ABNORMAL HIGH (ref 70–99)
Potassium: 2.9 mmol/L — ABNORMAL LOW (ref 3.5–5.2)
Sodium: 138 mmol/L (ref 134–144)
Total Protein: 6 g/dL (ref 6.0–8.5)
eGFR: 112 mL/min/1.73 (ref >59)

## 2024-05-23 LAB — CBC WITH DIFFERENTIAL/PLATELET
Basophils Absolute: 0 x10E3/uL (ref 0.0–0.2)
Basos: 1 %
EOS (ABSOLUTE): 0 x10E3/uL (ref 0.0–0.4)
Eos: 1 %
Hematocrit: 28.8 % — ABNORMAL LOW (ref 34.0–46.6)
Hemoglobin: 8.8 g/dL — ABNORMAL LOW (ref 11.1–15.9)
Immature Grans (Abs): 0.1 x10E3/uL (ref 0.0–0.1)
Immature Granulocytes: 1 %
Lymphocytes Absolute: 0.6 x10E3/uL — ABNORMAL LOW (ref 0.7–3.1)
Lymphs: 15 %
MCH: 26.5 pg — ABNORMAL LOW (ref 26.6–33.0)
MCHC: 30.6 g/dL — ABNORMAL LOW (ref 31.5–35.7)
MCV: 87 fL (ref 79–97)
Monocytes Absolute: 0.4 x10E3/uL (ref 0.1–0.9)
Monocytes: 10 %
Neutrophils Absolute: 3 x10E3/uL (ref 1.4–7.0)
Neutrophils: 72 %
Platelets: 233 x10E3/uL (ref 150–450)
RBC: 3.32 x10E6/uL — ABNORMAL LOW (ref 3.77–5.28)
RDW: 16.3 % — ABNORMAL HIGH (ref 11.7–15.4)
WBC: 4.2 x10E3/uL (ref 3.4–10.8)

## 2024-05-23 LAB — MAGNESIUM, RBC: Magnesium RBC: 4.9 mg/dL (ref 3.7–7.0)

## 2024-05-23 NOTE — Transitions of Care (Post Inpatient/ED Visit) (Signed)
 05/23/2024  Name: Chelsea Hansen MRN: [id number] DOB: May 25, 1971  Today's TOC FU Call Status: Today's TOC FU Call Status:: Successful TOC FU Call Completed TOC FU Call Complete Date: 05/23/24  Patient's Name and Date of Birth confirmed. Name, DOB  Transition Care Management Follow-up Telephone Call Date of Discharge: 05/22/24 Discharge Facility: [name]) Type of Discharge: Inpatient Admission Primary Inpatient Discharge Diagnosis:: Carcinomatosis peritonei How have you been since you were released from the hospital?: Better (feels like pain is controlled right now.) Any questions or concerns?: No  Items Reviewed: Did you receive and understand the discharge instructions provided?: Yes Medications obtained,verified, and reconciled?: No (missing meds that were supposed to come from [facility] pharmacy.) Any new allergies since your discharge?: No Dietary orders reviewed?: Yes Type of Diet Ordered:: low sosium heart diet Do you have support at home?: Yes People in Home [RPT]: child(ren), adult  Medications Reviewed Today: Medications Reviewed Today     Reviewed by Rumalda Alan PENNER, RN (Registered Nurse) on 05/23/24 at 1412  Med List Status: <None>   Medication Order Taking? Sig Documenting Provider Last Dose Status Informant  acetaminophen  (TYLENOL ) 325 MG tablet [id number] Yes Place 2 tablets (650 mg total) into feeding tube every 6 (six) hours as needed for mild pain (pain score 1-3) or moderate pain (pain score 4-6) (or Fever >/= 101). Arrien, Elidia Sieving, MD  Active   calcium  carbonate (OS-CAL - DOSED IN MG OF ELEMENTAL CALCIUM ) 1250 (500 Ca) MG tablet [id number] Yes Take 5 tablets (6,250 mg total) by mouth 3 (three) times daily with meals. Arrien, Elidia Sieving, MD  Active   ergocalciferol , VITAMIN D2, (DRISDOL ) 200 MCG/ML drops [id number] Yes Take 1 mL (8,000 Units total) by mouth daily. Arrien, Elidia Sieving, MD  Active   metoCLOPramide  (REGLAN ) 5 MG tablet [id number]   Take 1 tablet (5 mg total) by mouth every 6 (six) hours as needed for nausea or vomiting.  Patient not taking: Reported on 05/23/2024   Arrien, Mauricio Daniel, MD  Active   oxyCODONE  (OXYCONTIN ) 80 mg 12 hr tablet [id number] Yes Take 1 tablet (80 mg total) by mouth every 12 hours. (Remove fentanyl  patch and start Oxycontin  12 hours later)   Active Self, Pharmacy Records  oxyCODONE  (ROXICODONE ) 15 MG immediate release tablet [id number] Yes Take 1 tablet (15 mg total) by mouth every 4 (four) hours as needed for Pain for up to 14 days   Active Self, Pharmacy Records  pantoprazole  (PROTONIX ) 40 MG tablet [id number]  Take 1 tablet (40 mg total) by mouth daily.  Patient not taking: Reported on 05/23/2024   Arrien, Mauricio Daniel, MD  Active   scopolamine  (TRANSDERM-SCOP) 1 MG/3DAYS [id number]  Place 1 patch (1 mg total) onto the skin every 3 (three) days.  Patient not taking: Reported on 05/23/2024   Cheryle Page, MD  Active Self, Pharmacy Records  Med List Note Rockney Norleen LABOR, RPH-CPP 07/07/22 9196): Abemaciclib  through [facility];   UDS 02/11/22  MR 05/22/22 Medication Agreement signed. LP  Hydrocodone  approved through 5 13 23             Home Care and Equipment/Supplies: Were Home Health Services Ordered?: No Any new equipment or medical supplies ordered?: No  Functional Questionnaire: Do you need assistance with bathing/showering or dressing?: No Do you need assistance with meal preparation?: No Do you need assistance with eating?: No Do you have difficulty maintaining continence: No Do you need assistance with getting out of bed/getting out of a chair/moving?: No Do  you have difficulty managing or taking your medications?: Yes  Follow up appointments reviewed: PCP Follow-up appointment confirmed?: No (will call and schedule) MD Provider Line [phone] Given: No Specialist Hospital Follow-up appointment confirmed?: No Do you need transportation to your follow-up appointment?:  No Do you understand care options if your condition(s) worsen?: Yes-patient verbalized understanding  SDOH Interventions Today    Flowsheet Row Most Recent Value  SDOH Interventions   Food Insecurity Interventions Intervention Not Indicated  Housing Interventions Intervention Not Indicated  Transportation Interventions Intervention Not Indicated  Utilities Interventions Intervention Not Indicated   Spoke with Kiesha at the [facility] pharmacy and was informed that protonix  and reglan  were filled and the Vit D2 and Calcium  were not filled as they were not in stock.  Encouraged patient to call PCP office to schedule appointment. Reviewed all discharge instructions.  Patient confirms that she is connect with [facility] and is pending a call from case manager.  Reviewed importance with patient to call [facility] as well. 1430:  placed call to patient to update her about medications. Left a VM requesting a call back.  1515 Call back to patient to inform her of the above information about her medications. She reports to me that she is not able to look at the bag right now but will later. I encouraged patient to call me back for any other questions.   Patient reports that she will.  Patient declines any other additional needs. Denied TOC program.  Alan Ee, RN, BSN, CEN Population Health- Transition of Care Team.  [facility] [phone]

## 2024-05-23 NOTE — Progress Notes (Signed)
 SPIRITUAL CARE AND COUNSELING CONSULT NOTE   VISIT SUMMARY Followed up with Shona by phone after recent admission. She reports having a meaningful trip to [place] to celebrate Thanksgiving with her aunt, which was a big goal.    SPIRITUAL ENCOUNTER                                                                                                                                                                      Type of Visit: Follow up Care provided to:: Patient Referral source: Chaplain assessment Reason for visit: Routine spiritual support   SPIRITUAL FRAMEWORK  Presenting Themes: Community and relationships, Impactful experiences and emotions   GOALS   Self/Personal Goals: Plans to phone chaplain with updates after [facility] appt Clinical Care Goals: Provide spiritual/emotional support as needed   INTERVENTIONS   Spiritual Care Interventions Made: Compassionate presence, Reflective listening, Narrative/life review    INTERVENTION OUTCOMES   Outcomes: Awareness of support, Reduced isolation  SPIRITUAL CARE PLAN   Spiritual Care Issues Still Outstanding: Chaplain will continue to follow Follow up plan : Patient plans to follow up by phone    Chaplain [name], [facility], [facility] Inpatient Pager [phone] Voicemail [phone]

## 2024-05-24 ENCOUNTER — Other Ambulatory Visit (HOSPITAL_COMMUNITY): Payer: Self-pay

## 2024-05-24 MED ORDER — MAGNESIUM OXIDE 400 MG PO TABS
400.0000 mg | ORAL_TABLET | Freq: Every day | ORAL | 11 refills | Status: DC
  Filled 2024-05-24: qty 30, 30d supply, fill #0

## 2024-05-25 ENCOUNTER — Telehealth: Payer: MEDICAID | Admitting: *Deleted

## 2024-05-28 ENCOUNTER — Ambulatory Visit (INDEPENDENT_AMBULATORY_CARE_PROVIDER_SITE_OTHER): Payer: Self-pay | Admitting: Primary Care

## 2024-05-29 ENCOUNTER — Other Ambulatory Visit (HOSPITAL_COMMUNITY): Payer: Self-pay

## 2024-05-30 ENCOUNTER — Ambulatory Visit: Payer: MEDICAID

## 2024-05-30 ENCOUNTER — Other Ambulatory Visit: Payer: MEDICAID

## 2024-05-30 ENCOUNTER — Ambulatory Visit: Payer: MEDICAID | Admitting: Hematology and Oncology

## 2024-05-30 NOTE — Care Plan (Signed)
°  Problem: Safety Goal: Free from accidental physical injury Outcome: Progressing Goal: Free from abuse Outcome: Progressing   Problem: Knowledge deficit: Goal: Patient/caregiver ability to state and carry out methods to decrease the pain will improve Description:      Outcome: Progressing Note: Patient satisfied with pain goal. Pain Intervention(s): Medication (PO/VT short acting opioid given)  Goal: Patient/caregiver ability to identify factors that increase the pain will improve Outcome: Progressing Note: Patient satisfied with pain goal. Pain Intervention(s): Medication (PO/VT short acting opioid given)    Problem: Skin Integrity Impairment for Adult populations Goal: Moisture Description: All Adult patients at medium/high risk based on Braden risk assessment subscale will not develop a pressure injury. Implement prevention interventions based on risk assessment sub-scale scores: Outcome: Progressing Goal: Nutrition Description: All Adult patients at medium/high risk based on Braden risk assessment subscale will not develop a pressure injury. Implement prevention interventions based on risk assessment sub-scale scores: Outcome: Progressing

## 2024-05-31 ENCOUNTER — Other Ambulatory Visit (HOSPITAL_COMMUNITY): Payer: Self-pay

## 2024-05-31 ENCOUNTER — Encounter: Payer: Self-pay | Admitting: Hematology and Oncology

## 2024-05-31 ENCOUNTER — Other Ambulatory Visit: Payer: Self-pay

## 2024-05-31 MED ORDER — BISACODYL 10 MG RE SUPP
10.0000 mg | Freq: Every day | RECTAL | 0 refills | Status: AC
  Filled 2024-05-31: qty 10, 10d supply, fill #0

## 2024-05-31 MED ORDER — OXYCODONE HCL 15 MG PO TABS
15.0000 mg | ORAL_TABLET | ORAL | 0 refills | Status: DC | PRN
  Filled 2024-05-31: qty 84, 14d supply, fill #0

## 2024-05-31 MED ORDER — OLANZAPINE 2.5 MG PO TABS
2.5000 mg | ORAL_TABLET | Freq: Every day | ORAL | 0 refills | Status: DC
  Filled 2024-05-31: qty 30, 30d supply, fill #0

## 2024-05-31 MED ORDER — SODIUM CHLORIDE 0.9% FLUSH
10.0000 mL | INTRAVENOUS | 1 refills | Status: AC
  Filled 2024-05-31: qty 180, 10d supply, fill #0

## 2024-05-31 MED ORDER — PREGABALIN 25 MG PO CAPS
25.0000 mg | ORAL_CAPSULE | Freq: Two times a day (BID) | ORAL | 0 refills | Status: DC
  Filled 2024-05-31: qty 60, 30d supply, fill #0

## 2024-05-31 MED ORDER — PANTOPRAZOLE SODIUM 40 MG PO TBEC
40.0000 mg | DELAYED_RELEASE_TABLET | Freq: Every day | ORAL | 0 refills | Status: AC
  Filled 2024-05-31: qty 30, 30d supply, fill #0

## 2024-05-31 MED ORDER — VITAMIN D3 10 MCG/ML PO LIQD
2.5000 mL | Freq: Every day | ORAL | 0 refills | Status: AC
  Filled 2024-05-31: qty 100, 40d supply, fill #0

## 2024-05-31 MED ORDER — CALCIUM CITRATE-VITAMIN D 315-6.25 MG-MCG PO TABS
2.0000 | ORAL_TABLET | Freq: Two times a day (BID) | ORAL | 0 refills | Status: AC
  Filled 2024-05-31: qty 120, 30d supply, fill #0

## 2024-05-31 NOTE — Discharge Summary (Signed)
 [facility] Discharge Summary  Admit Date: 05/25/2024 Discharge Date: 06/01/2024  Admitting Physician: Arthea Crea, MD Discharge Physician: Benjie Cozette Hug, MD  Primary Care Provider: Unknown (Inactive), Phone None  Discharge Destination: Home  Admission Diagnoses:  Electrolyte disturbance [E87.8] Breast cancer metastasized to bone, right (CMS/HHS-HCC) [C50.911, C79.51] Bilateral malignant neoplasm of breast in female, unspecified estrogen receptor status, unspecified site of breast (CMS/HHS-HCC) [C50.911, C50.912]  Discharge Diagnoses:  Principal Problem:   Breast cancer metastasized to bone, right (CMS/HHS-HCC) Active Problems:   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  Changes Made (with rationale):  Olanzapine  QHS started Addition of Calcitriol , Citacal and vitamin D3 Initiation of Lyrica  Dose reduction of Oxycontin  to 60 mg BID  To-Do List (incidental findings, follow-up studies, etc.): Follow up CMP, Ionized Calcium  within 1 week of discharge Recommend Vit D level in 1 month  Anticipatory Guidance for Outpatient Care:  Patient has follow up with Endocrine on 08/10/2023 Patient with Oncology follow up on 06/07/24 Patient's medications sent to local pharmacy per patient request.     Results Pending at Discharge:  None Please see phone numbers at end of this summary  for lab contact information.   Follow-up/Care Transition Plan: Sched. appts: Future Appointments  Date Time Provider Department Center  06/07/2024  2:30 PM [facility]  06/07/2024  3:00 PM Floretta Gut, MD [facility] BRE[facility]  06/12/2024  4:00 PM Alease Yancy Bare, NP [facility]  06/26/2024 10:00 AM Theophilus Chew [facility]  08/09/2024 10:30 AM Cacchio, Belvie Cleaves, PA ENDOCRIN [facility]    Follow-up info: Continue      Allergies/Intolerances:  Allergies  Allergen Reactions   Denosumab  Other (See Comments)    Severe prolonged hypocalcemia needing hospitalization     New Adverse Drug Events: none  Medications:     Current Discharge Medication List     START taking these medications      Instructions  bisacodyL  10 mg suppository Quantity: 12 suppository Refills: 0 Stop taking on: June 10, 2024  Commonly known as: DULCOLAX Place 1 suppository (10 mg total) rectally once daily for 10 days Last time this was given: 10 mg on May 30, 2024 11:03 AM   calcitRIOL  0.5 MCG capsule Refills: 0  Commonly known as: ROCALTROL  Take 0.5 mcg by mouth Last time this was given: 0.5 mcg on June 01, 2024  7:55 AM   calcium  citrate-vitamin D3 315 mg-6.25 mcg (250 unit) tablet Quantity: 120 tablet Refills: 0  Commonly known as: CITRACAL+D Take 2 tablets by G tube 2 (two) times daily with meals for 30 days Disperse and crush tablets in 10mL of water . Agitate for 5 minutes before administering via tube. Last time this was given: 2 tablets on June 01, 2024  7:55 AM   cholecalciferol  10 mcg/mL (400 unit/mL) Syrg oral liquid Quantity: 75 mL Refills: 0  Commonly known as: VITAMIN D3 Take 2.5 mLs (1,000 Units total) by mouth  once daily for 30 days Last time this was given: 1,000 Units on June 01, 2024  7:56 AM   lidocaine  4 % patch Refills: 0  Commonly known as: SALONPAS Place 2 patches onto the  skin daily for 30 days Apply patch to the most painful area for up to 12 hours in a 24 hours period. Last time this was given: 2 patches on May 31, 2024 12:09 PM   OLANZapine  2.5 MG tablet Quantity: 30 tablet Refills: 0  Commonly known as: ZyPREXA  Take 1 tablet (2.5 mg total) by mouth at bedtime for 30 days Last time this was given: 2.5 mg on May 31, 2024  9:08 PM   pregabalin  25 MG capsule Quantity: 60 capsule Refills: 0  Commonly known as: LYRICA  Take 1 capsule (25 mg total) by mouth every 12 (twelve) hours for 30 days Last time this was given: 25 mg on May 31, 2024  8:34 AM   sennosides 8.6 mg tablet Refills: 0  Commonly known as: SENOKOT Take 2 tablets by mouth 2 (two) times daily Last time this was given: 2 tablets on June 01, 2024  7:55 AM   sodium chloride  0.9% flush injection Quantity: 180 each Refills: 1  10 mLs by Intracatheter route as directed for [facility]       These medications have been CHANGED      Instructions  magnesium  oxide 400 mg (241.3 mg magnesium ) tablet Quantity: 30 tablet Refills: 11 What changed: Another medication with the same name was removed. Continue taking this medication, and follow the directions you see here.  Commonly known as: MAG-OX Take 1 tablet (400 mg total) by mouth once daily Last time this was given: Ask your nurse or doctor   oxyCODONE  15 MG immediate release tablet Quantity: 84 tablet Refills: 0 What changed: Another medication with the same name was changed. Make sure you understand how and when to take each.  Commonly known as: ROXICODONE  Take 1 tablet (15 mg total) by mouth every 4 (four) hours as needed for Pain for up to 14 days Last time this was given: Ask your nurse or doctor   oxyCODONE  60 mg ER tablet Quantity: 60 tablet Refills: 0 What changed:  medication strength how much to take additional instructions  Commonly known as: OxyCONTIN  Take 1 tablet (60 mg total) by mouth every 12  (twelve) hours Last time this was given: Ask your nurse or doctor   pantoprazole  40 MG DR tablet Quantity: 30 tablet Refills: 0 What changed: when to take this  Commonly known as: PROTONIX  Take 1 tablet (40 mg total) by mouth once daily for 30 days Last time this was given: 40 mg on June 01, 2024  7:56 AM       CONTINUE taking these medications      Instructions  acetaminophen  325 MG tablet Refills: 0  Commonly known as: TYLENOL  650 mg   albuterol  MDI (PROVENTIL , VENTOLIN , PROAIR ) HFA 90 mcg/actuation inhaler Refills: 0  TAKE 2 PUFFS BY MOUTH EVERY 6 HOURS AS NEEDED FOR WHEEZE OR SHORTNESS OF BREATH   DULoxetine  30 MG DR capsule Quantity: 30 capsule Refills: 1  Commonly known as: CYMBALTA  Take 1 capsule (30 mg total) by mouth at bedtime Last time this was given: 30 mg on May 31, 2024  9:08 PM   ondansetron  4 MG disintegrating tablet Refills: 0  Commonly known as: ZOFRAN -ODT Take 4 mg by mouth Last time this was given: Ask your nurse or doctor   polyethylene  glycol powder Refills: 0  Commonly known as: MIRALAX  Take 17 g by mouth Last time this was given: Ask your nurse or doctor       STOP taking these medications    ADVAIR  HFA 115-21 mcg/actuation inhaler Generic drug: fluticasone  propion-salmeteroL   amLODIPine  10 MG tablet Commonly known as: NORVASC    atorvastatin  40 MG tablet Commonly known as: LIPITOR   benzonatate  200 MG capsule Commonly known as: TESSALON    dexAMETHasone  2 MG tablet Commonly known as: DECADRON    doxycycline  100 MG tablet Commonly known as: VIBRA -TABS   ergocalciferol  (vitamin D2) 1,250 mcg (50,000 unit) capsule Commonly known as: VITAMIN D2   fluticasone  propionate 50 mcg/actuation nasal spray Commonly known as: FLONASE    gabapentin  300 MG capsule Commonly known as: NEURONTIN    hydroCHLOROthiazide  25 MG tablet Commonly known as: HYDRODIURIL    hydrocortisone  10 MG tablet Commonly known as: CORTEF     lidocaine -prilocaine  cream Commonly known as: EMLA    loratadine  10 mg tablet Commonly known as: CLARITIN    metoclopramide  5 MG tablet Commonly known as: REGLAN    montelukast  10 mg tablet Commonly known as: SINGULAIR    omeprazole  40 MG DR capsule Commonly known as: PriLOSEC   sennosides-docusate 8.6-50 mg tablet Commonly known as: SENOKOT-S   sulfamethoxazole -trimethoprim  800-160 mg tablet Commonly known as: BACTRIM  DS         Brief History of Present Illness:   Per H&P from 05/25/2024  JAYCI ELLEFSON is a 53 y.o. female with metastatic ER+ cT2 cN1 ILC, G2, ER/PR+ HER-  breast cancer c/b recurrent bowel obstructions requiring G tube and recurrent hospitalizations for G tube site leaks, who presents to ED on the advice of her oncologist Dr. Kandyce Archer after having hypocalcemia to 6.3 on in-office labs. Accompanied by her sister.    S was recently admitted to [facility] (11/29 - 12/3) for abdominal pain, nausea, vomiting and electrolyte abnormalities including hypocalcemia, hypokalemia, hypomagnesemia. On record review, she subsequently had evidence of disease progression in late 2024 involving lymph nodes, abdomen and pancreas. She had a peritoneal biopsy that confirmed metastatic breast cancer with ER 100% PR 0% and HER2 2+ 01/2024. Suspended hospice services in 04/2024 for second opinion at [facility].    Per chart review briefly, patient was diagnosed with right breast cancer in 2022, cT2 cN1 ILC, G2, ER/PR+ HER-, with mammaprint showing high risk luminal B type disease. She underwent neoadjuvant chemotherapy with AC-T (01/2021) followed by lumpectomy, which showed extensive residual disease, ypT2 ypN1a and left lumpectomy with HG DCIS. She underwent adjuvant XRT and was started on AET. Only a few months later, 04/2022, she was discovered to have bone mets on a CT for chest wall pain and abemaciclib  was added to her AI. She had palliative RT to L spine. On record review, she  subsequently had evidence of disease progression in late 2024 involving lymph nodes, abdomen and pancreas. She had a peritoneal biopsy that confirmed metastatic breast cancer with ER 100% PR 0% and HER2 2+. Enhertu  was started. She subsequently had several hospitalizations for abdominal pain, bowel obstructions, G-tube leaks, electrolyte derangements. Goals of care were discussed and she was initiated on hospice services, which she suspended to seek care at [facility]. She had negative germline genetics with a VUS in ATM [id number].    On arrival to ED, On presentation to the ED, vitals were T 37 C (98.6 F), HR (!) 133 (nurse notified), BP 103/69 (nurse notified), RR 18, and satting 100 % on RA. Labs notable for Na  138, K 3.4*, HCO3 25, BUN 7, sCr 0.4. CBC notable for WBC 3.5, Hgb 9.3*, plts 226. Received 3g calcium , 1L LR and then admitted to medicine for further evaluation.   On my interview, patient is doing well. ROS is positive only for mild nausea, which is her baseline. No numbness, tingling, She has had NBNB emesis almost daily since June when she had an SBO and when this recurrence of her cancer was diagnosed. No emesis today and has been able to tolerate PO. Has been eating and drinking about normal since discharge from [facility] (1/2 cup of broccoli soup and 1/2 sandwich from [employer] for lunch). Also endorsing abdominal pain which is baseline for her in the last few months. Has G tube in place which she has never used. _____________________   Hospital Course by Problem:  PROBLEM-ORIENTED HOSPITAL SUMMARY  Metastatic Breast Cancer (Bone, Peritoneal, Digestive System) She has a history of ER+ invasive lobular breast cancer. Her disease course has been complicated by recurrent bowel obstructions, chronic abdominal pain, persistent nausea, and daily non-bloody non-bilious emesis. Imaging during admission showed diffuse sclerotic osseous metastatic disease, pathologic compression deformity of the L3 vertebral  body, and no evidence of visceral metastatic disease in the abdomen or pelvis. She was transitioned to fulvestrant prior to admission, with plans for capivasertib to be considered in future cycles. Enhertu  and denosumab  were discontinued prior to admission due to persistent hypocalcemia. Restaging scans were completed inpatient, and she will continue outpatient oncology follow-up with Dr. Floretta.  Hypocalcemia and Electrolyte Abnormalities She presented with acute on chronic hypocalcemia (serum calcium  6.3 mg/dL, ionized calcium  0.86 mmol/L), mild hypokalemia, and mild hypomagnesemia. Hypocalcemia was attributed to prior denosumab  use, low vitamin D  (9 ng/mL), and diffuse osseous metastatic disease, with elevated PTH (803 pg/mL) and normal phosphorus. She received IV and oral calcium  supplementation, vitamin D3, and calcitriol , with improvement in calcium  levels to low-normal range by discharge. Endocrinology recommended ongoing oral calcium  citrate-vitamin D3, vitamin D3, and calcitriol , with follow-up labs after discharge. Weekly ergocalciferol  was discontinued.  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 OxyContin  was dose reduced due to sedation and then increased back to prior home dose of 80 mg Q12H; oxycodone  IR was continued for breakthrough pain. Pregabalin  and lidocaine  patches were initiated for neuropathic pain. Dexamethasone  was held due to concern for poor wound healing in the setting of possible surgical closure of enterocutaneous fistula and/or G-tube site. - Greatly appreciate palliative care guidance re: pain management. Dose reduction of Oxycontin  to 60 mg BID, continue PRN Oxycodone  and START low dose Lyrica . 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 The G-tube has been upsized due to recurrent leaking, with hypergranulation tissue and serous drainage at the skin site. Interventional radiology provided bedside education on cinching the disc and treated granulation tissue with silver  nitrate; further outpatient surgical referral for fistula management was recommended. Wound/ostomy nursing provided dressing and skin care recommendations for both sites, with plans for continued wound care at home. - Outpatient referral to [facility] for supplies - Patient provided supplies on discharge - Patient  provided education on fistula and Gtube management   Intractable Nausea and Vomiting She experienced persistent daily nausea and emesis, considered baseline with intermittent oral intake. Nausea improved with inpatient management, including discontinuation of scopolamine  patch and initiation of olanzapine  2.5 mg QHS; ondansetron  was continued as needed. A trial of olanzapine  5 mg ODT was over-sedating, so lower dosing was used.  Malnutrition She was diagnosed with severe protein-calorie malnutrition, with >29% unintentional weight loss in 6 months and energy intake <75% of estimated requirement for ##1 month. Nutrition-focused physical exam showed mild/moderate muscle and fat loss. She maintained oral intake during admission, with oral nutrition and supplements recommended; enteral feeds were not initiated due to risk of worsening G-tube leakage.  Constipation She had ongoing constipation, with last BM documented on 12/6 and reported interval of >1 week without BM. Aggressive bowel regimen was implemented, including Miralax  BID, senna BID, and bisacodyl  suppository, resulting in improvement by discharge.  Chronic  Anemia She has chronic anemia, with hemoglobin ranging from 7.7 to 9.3 g/dL during admission, and history of prior transfusions. No evidence of active bleeding was noted, and daily CBC monitoring was performed.  Other Active Problems Her essential hypertension and GERD were managed during admission without acute issues.  Discharge Planning and Follow-Up She will require home health nursing for wound care and physical therapy, with outpatient follow-up for oncology, endocrinology, nutrition, and palliative care. Advance care planning was addressed, with updated HCPOA documentation completed during admission.   Malnutrition: She has been diagnosed with severe protein-calorie malnutrition in the context of chronic illness, based on energy intake meeting < 75% of estimated requirement for > or equal to 1 month, >10% unintentional weight loss in 6 months.  - Recommend oral nutrition, Recommend enteral nutrition support.   Social Drivers of Health with Concerns   Tobacco Use: Medium Risk (05/24/2024)   Patient History    Smoking Tobacco Use: Former    Smokeless Tobacco Use: Never  Physical Activity: Inactive (02/13/2024)   Received from [facility]   Exercise Vital Sign    On average, how many days per week do you engage in moderate to strenuous exercise (like a brisk walk)?: 0 days    On average, how many minutes do you engage in exercise at this level?: 0 min  Social Connections: Moderately Isolated (05/20/2024)   [facility]   Social Connection and Isolation Panel    In a typical week, how many times do you talk on the phone with family, friends, or neighbors?: More than three times a week    How often do you get together with friends or relatives?: Once a week    How often do you attend church or religious services?: 1 to 4 times per year    Do you belong to any clubs or organizations such as church groups, unions, fraternal or athletic groups, or school groups?: No     How often do you attend meetings of the clubs or organizations you belong to?: Never    Are you married, widowed, divorced, separated, never married, or living with a partner?: Never married    Surgeries and Procedures Performed:   Silver  nitrate to granulation tissue by IR   _____________________  Discharge Exam:  BP 101/58 (BP Location: Left upper arm, Patient Position: Lying)   Pulse 99   Temp 37.5 C (99.5 F) (Oral)   Resp 16   Ht 173 cm (5' 8.11)   Wt 67.6 kg (149 lb 0.5 oz)  SpO2 96%   BMI 22.59 kg/m    General: alert, cooperative, in NAD Eyes: conjunctiva clear, anicteric sclera HENT: oropharynx clear, dry mucous membranes CV: regular rate and rhythm, without murmurs, rubs or gallops Resp: clear to auscultation, good air exchange Abd: soft, tenderness to palpation, normoactive bowel sounds , G tube in place, capped with green fluid inside. Site is cdi with dressing applied.  Ostomy bag in place draining fistula at prior J tube site, draining amber liquid.  Ext: no lower extremity edema Skin: no rashes or lesions Psych: oriented to time, place and person, mood and affect are appropriate Neuro: Grossly normal and symmetric strength in upper and lower extremities.  Pertinent Lab Testing: Recent Labs  Lab 05/30/24 0325 05/31/24 0557 06/01/24 0712  NA 131* 133* 131*  K 4.5 4.5 4.6  CL 99 98 99  CO2 26 26 25   BUN 7 6* 7  CREATININE 0.6 0.7 0.7  GLUCOSE 92 99 106  CALCIUM  7.1* 7.2* 7.2*   Recent Labs  Lab 05/30/24 0325 05/31/24 0557 06/01/24 0712  AST 14* 15 17  ALT 14 13 13   ALKPHOS 195* 205* 202*  TBILI 0.5 0.5 0.5    Recent Labs  Lab 05/30/24 0325 05/31/24 0557 06/01/24 0712  WBC 3.6 4.8 5.1  HGB 7.9* 8.1* 7.9*  HCT 25.7* 26.4* 25.5*  PLT 223 241 213   No results for input(s): APTT, INR in the last 168 hours.   Other Pertinent Labs:    Lab Results  Component Value Date   CAION 1.01 (L) 06/01/2024   CALCIUM  7.2 (L) 06/01/2024    ALB 2.2 (L) 06/01/2024   Micro:   Lab Results  Component Value Date   POCFLUA Not Detected 05/25/2024   POCFLUB Not Detected 05/25/2024   SARSCOV2 Not Detected 05/25/2024   Pertinent Imaging:   CT chest without contrast with 3D MIPS Protocol Result Date: 05/28/2024 PROCEDURE: CT CHEST WITHOUT CONTRAST WITH 3D MIPS PROTOCOL INDICATION: Cancer staging, C50.911 Malignant neoplasm of unspecified site of right female breast (CMS/HHS-HCC), C79.51 Secondary malignant neoplasm of bone (CMS/HHS-HCC), E87.8 Other disorders of electrolyte and fluid balance, not elsewhere classified, C50.911 Malignant neoplasm of unspecified site of right female breast (CMS/HHS-HCC), C50.912 Malignant neoplasm of unspecified site of left female breast (CMS COMPARISON: Multiple comparison exams, the most recent CT chest dated 04/09/2024, CTA chest 02/27/2024 TECHNIQUE:  Volumetric non-contrast chest CT acquisition was performed from the lower neck to the adrenal glands. Axial, coronal, and sagittal reconstructions were performed. 3D axial maximum intensity projection images (MIPS) were reconstructed to facilitate lung nodule detection. FINDINGS: Support Devices: Right anterior chest wall internal jugular port catheter tip in the right atrium. Neck and Axilla:  The visualized neck structures are within normal limits. Mediastinum:  No evidence of mediastinal lymph node enlargement. Small hiatal hernia. Cardiovascular:  Normal cardiac chamber size. Physiologic pericardial fluid. The thoracic aorta is normal caliber. The main pulmonary artery is normal caliber. Unchanged dilated right superior paravertebral vein. Lungs and Airways: Patent central airways. Stable right apical lung subpleural nodules and thickening. Right upper lobe subpleural reticular opacities likely relate to postradiation changes. Stable few scattered solid centrilobular nodules measuring less than 6 mm in mean diameter. Representative solid nodule left upper lobe  measuring less than 6 mm mean diameter (series 4, image 30). Pleura: The pleural spaces are within normal limits. Visualized Abdomen: Please see separately dictated MRI abdomen/pelvis report for abdominal findings. Bones and Soft Tissues: Similar diffuse sclerotic osseous metastases. Right breast postprocedural changes.  Minimal diffuse body wall soft tissue edema. IMPRESSION: No evidence of metastatic disease in the lungs or mediastinum. Similar diffuse osseous metastases. Electronically Reviewed by:  Dale Bob, DO, [facility] Electronically Reviewed on:  05/28/2024 3:14 PM I have reviewed the images and concur with the above findings. Electronically Signed by:  Hendricks Saint, MD, [facility] Electronically Signed on:  05/28/2024 7:15 PM  NM bone scan whole body Result Date: 05/28/2024 Procedure: NM BONE SCAN WHOLE BODY Indication: Female, 53 years old. cancer staging, C50.911 Malignant neoplasm of unspecified site of right female breast (CMS/HHS-HCC), C79.51 Secondary malignant neoplasm of bone (CMS/HHS-HCC), E87.8 Other disorders of electrolyte and fluid balance, not elsewhere classified, C50.911 Malignant neoplasm of unspecified site of right female breast (CMS/HHS-HCC), C50.912 Malignant neoplasm of unspecified site of left female breast (CMS. Radiotracer: 26.3 mCi of Tc-63m MDP, IV. Technique: Delayed whole-body planar images were obtained in anterior and posterior projections. Prior bone scans: None Correlative imaging: Same day CT. Chest and 05/26/2024 CT abdomen pelvis Findings: Abnormal and nearly diffuse and homogenous radiotracer uptake in the thoracolumbar spine, bilateral hemuri, sternum, pelvis, and bilateral femurs. There are no suspicious foci of increased radiotracer activity to suggest osseous metastatic disease. Physiologic tracer activity is identified in the soft tissues, kidneys and urinary bladder. Impression: Scintigraphic evidence of diffuse osseous metastatic disease. Given  diffuse nature, sensitivity for individual lesions can be diminished. Electronically Reviewed by:  Arthea Glatter, MD, [facility] Electronically Reviewed on:  05/28/2024 4:36 PM I have reviewed the images and concur with the above findings. Electronically Signed by:  Ezella Agent, MD, [facility] Electronically Signed on:  05/28/2024 4:45 PM  MRI abdomen and MRCP w wo contrast w 3D Result Date: 05/27/2024 Procedure:  MRI Abdomen with and without contrast, with MRCP Indication: Pancreatic cancer screening, c/f new pancreatic and biliary duct dilations on ctap, C50.911 Malignant neoplasm of unspecified site of right female breast (CMS/HHS-HCC), C50.912 Malignant neoplasm of unspecified site of left female breast (CMS/HHS-HCC) Comparison:  03/07/2024 Technique: Precontrast and dynamic postcontrast MR imaging of the abdomen was performed using the Liver Protocol. IV contrast was administered to improve disease detection and further define anatomy. MRCP was also performed, with 3-D reconstruction performed to evaluate biliary anatomy. Findings: - Lower Thorax: No suspicious pulmonary abnormalities. No pleural or pericardial effusions. - Liver: Normal in morphology and enhancement Mild hepatic iron deposition.  No suspicious hepatic masses are identified.  The portal and hepatic veins are patent. - Biliary and Gallbladder: Variant biliary anatomy with the right posterior bile duct concerning near the origin of the left hepatic duct and early bifurcation of the right posterior biliary duct. Common bile duct is dilated up to 1.3 cm, similar compared to MRCP 03/07/2024. No filling defect along the common bile duct which comes to a smooth taper at the ampulla. The gallbladder is unremarkable. - Spleen: Normal in appearance.  - Pancreas: Normal in appearance. - Adrenal Glands: Normal in appearance. - Kidneys: Multiple simple cysts in both kidneys. No suspicious renal lesions. No hydronephrosis. - Abdominal Vasculature:  No abdominal aortic aneurysm. - Gastrointestinal Tract: No abnormal dilation or wall thickening in the field of view. Gastrostomy tube is in place in expected position. Partially imaged left lower quadrant ileostomy, with mild enhancement along the ostomy. - Peritoneum/Mesentery/Retroperitoneum: No free fluid in the field of view. - Lymph Nodes: No retroperitoneal or mesenteric lymphadenopathy.  - Body Wall: Previously described nodular density adjacent to the skin bumper of a gastrostomy tube is no longer seen along  the anterior abdominal wall. - Musculoskeletal:  Heterogeneous marrow signal in keeping with diffuse sclerotic metastatic disease. Impression: 1.  No choledocholithiasis or obstructing biliary lesion. 2.  Similar degree of mild biliary ductal dilation coming to a smooth taper at the ampulla, stable over multiple examinations and of doubtful clinical significance. 3.  Mild enhancement along the left lower quadrant ileostomy may represent some focal inflammatory changes. Correlate with direct visualization. 4.  No evidence of visceral metastatic disease in the abdomen. The preliminary report (critical or emergent communication) was reviewed prior to this dictation and there are no critical differences between the preliminary results and the impressions in this final report. Electronically Reviewed by:  Jerrell Senior, MD, [facility] Electronically Reviewed on:  05/27/2024 10:49 AM I have reviewed the images and concur with the above findings. Electronically Signed by:  Jennifer Bring, MD, [facility] Electronically Signed on:  05/27/2024 12:17 PM  CT abdomen pelvis with contrast Result Date: 05/26/2024 CT abdomen and pelvis with IV contrast Comparison:  04/09/2024. Indication:  Breast cancer, invasive, stage IV, assess treatment response, C50.911 Malignant neoplasm of unspecified site of right female breast (CMS/HHS-HCC), C50.912 Malignant neoplasm of unspecified site of left female breast  (CMS/HHS-HCC). Technique:  CT imaging was performed of the abdomen and pelvis following the administration of intravenous contrast.  Iodinated contrast was used due to the indications for the examination, to improve disease detection and further define anatomy. Coronal and sagittal reformatted images were generated and reviewed. Findings: - Lower Thorax: No suspicious pulmonary abnormalities. No pleural or pericardial effusions. Partially imaged central venous catheter in the upper right atrium. - Liver: Hyperattenuation along the falciform ligament may represent focal fat versus third inflow phenomenon. There are some too small to characterize hypoattenuating lesions in the liver that are not substantially changed. No suspicious hepatic masses are identified.  The portal and hepatic veins are patent. - Biliary and Gallbladder: Common bile duct is mildly dilated up to 1.1 cm with mild central hepatic ductal dilation, increased compared to prior but coming to a smooth taper at the ampulla and also similar compared to 04/01/2024.. Gallbladder is unremarkable. - Spleen: Normal in appearance.  - Pancreas: Minimal caliber of the main pancreatic duct up to 4 mm, similar compared to 04/01/2024 - Adrenal Glands: Normal in appearance. - Kidneys: Simple cysts in both kidneys as well as too small to characterize hypoattenuating lesions in both kidneys that are unchanged. Mild scarring along the right upper pole kidney. No hydronephrosis. - Abdominal and Pelvic Vasculature: No abdominal aortic aneurysm. - Gastrointestinal Tract: Scattered colonic diverticuli without evidence of diverticulitis. This study was placed with the retention balloon in the gastric lumen. Status post gastrojejunostomy. - Peritoneum/Mesentery/Retroperitoneum: No free fluid.  No free intraperitoneal air. - Lymph Nodes: No retroperitoneal, mesenteric, pelvic, or inguinal lymphadenopathy.  - Bladder: Normal in appearance. - Pelvic Organs: Uterus is absent.  - Body Wall: There is a 1.3 cm high attenuation nodular density right adjacent to the gastrostomy tube skin bumper, series 2 image 61, indeterminate and should correlate with physical examination. There is a left hemiabdomen ileostomy - Musculoskeletal:  Similar appearing extent of diffuse sclerotic osseous metastatic disease with pathologic compression deformity of the L3 vertebral body. Impression: 1.  Mild intrahepatic ductal dilation coming to a taper at the ampulla, which is apparently increased compared to 04/09/2024 although that exam was motion degraded but similar compared to 04/01/2024. This is of uncertain clinical significance in the setting of a normal total bilirubin and waxing and  waning nature. 2.   Gastrostomy tube in place with a new hyperattenuating cutaneous nodular density just right of the gastrostomy tube, indeterminate. Correlate with direct visualization. 3.  Similar extent of diffuse osseous metastatic disease within L3 pathologic compression fracture. No evidence of visceral metastatic disease in the abdomen or pelvis. The preliminary report (critical or emergent communication) was reviewed prior to this dictation and there are no critical differences between the preliminary results and the impressions in this final report. Electronically Reviewed by:  Jerrell Senior, MD, [facility] Electronically Reviewed on:  05/26/2024 10:54 AM I have reviewed the images and concur with the above findings. Electronically Signed by:  Jennifer Bring, MD, [facility] Electronically Signed on:  05/26/2024 1:58 PM  _____________________  Code Status: Full Code Goals of care were addressed during this admission: remains full code.   Status on Discharge:  Current activity: Walks occasionally (06/01/24 1100) Current mobility: No limitation (06/01/24 1100)  Activity Recommendation: activity as tolerated  Other Discharge Instructions: Services setup at discharge: Outpatient Ostomy care w/ wound  clinic + Outpatient PT Tubes/lines at discharge: Port de accessed at time of discharge, Gtube + Ostomy bag over fistula  Diet: Diet regular Oral Supplements - Adult All Supplements; Ensure Plus High Protein-Chocolate; Breakfast and Dinner  Wound Care Order Instructions     Impression: G tube  Fistula    Recommendations:   Gtube: Clean the skin with normal saline and spray the intact skin with no sting barrier spray.  Cut a slit on a Mepilex UP ([id number]) foam dressing and apply under the bumper of the G tube.  Change daily and as need for soilage.   Fistula pouch: Remove old pouch and cleanse skin and stoma with warm water  and pat dry. Treat any broken or irritated skin with a light layer of stoma powder and spray the powder with no sting barrier spray. (can skip this step if no skin is broken down) Measure stoma size with size chart or pattern to confirm size. Trace pattern on new wafer and cut to size. Remove the plastic backing on the wafer and (Optional) place a rolled worm of a moldable ring around the cut opening of the wafer.  Place wafer over stoma opening and seal the wafer around the stoma. Attach the pouch to the wafer and seal shut.  Can add ostomy belt if needed.   Air [employer] CONVEX wafer 1 3/4in flange [phone] [id number]  Hollister drainable pouch 1 3/4 inch flange [id number]  Convatec ostomy powder (Step # 1 to treat irritated skin around stoma) [id number]  72M Cavilon No Sting Barrier (Step # 2 to seal the powder on irritated skin around the stoma) [id number] (spray) 3342 (pads)  Hollister Adapt Ring (if needed to seal cut opening on back of wafer) [phone]  [facility] 12377 (medium) 4215 (medium)    Convatec Sensicare adhesive remover   [id number] (spray)   [id number] (pads)          _____________________  Time spent on discharge process: 35 minutes    ARGENIS NORMAN NAP, PA [facility]   06/01/2024   Hospital Contact Information:  [facility]) [facility]) [facility])  Pending tests:  Laboratory: [phone] Microbiology: [phone] Pathology: [phone] Radiology: [phone]  General questions: [phone] Pending tests: Laboratory: [phone] Microbiology: [phone] Pathology: [phone] Radiology: [phone]  General questions:  [phone] Pending tests:  Laboratory: [phone] Microbiology: [phone] Pathology: [phone] Radiology: [phone]  General questions:  [phone] Pending tests: Laboratory: [phone] Microbiology: [phone] Pathology: [phone] Radiology: [phone]  General questions:  [phone]         ------------------------------------------------------------------------------- Attestation signed by Benjie Cozette Hug, MD at 06/01/2024 12:22 PM I personally saw and examined the patient. I reviewed the patient's current symptoms, overnight events, vital signs, recent labs and imaging studies, performed a physical examination, and formulated a plan with high level of medical decision making (MDM).  I agree with the above documentation with any exceptions noted. I performed the substantive portion of the MDM and am billing this as a shared visit with Murrell Nap, PA.   Briefly, patient is a 53 y.o. 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 Patient treated with IV and oral calcium  supplementation, vitamin D3 and calcitriol , and will discharge on an oral regimen of oral calcium  citrate, vitamin D3, and calcitriol . Pain under better control with oxycontin  60 mg BID, PRN oxycodone .  Patient discharged to home with close outpatient follow-up.   Cozette Benjie, MD, PhD [phone]  -------------------------------------------------------------------------------

## 2024-06-01 ENCOUNTER — Other Ambulatory Visit (HOSPITAL_COMMUNITY): Payer: Self-pay

## 2024-06-01 ENCOUNTER — Encounter: Payer: Self-pay | Admitting: Hematology and Oncology

## 2024-06-01 ENCOUNTER — Other Ambulatory Visit: Payer: Self-pay

## 2024-06-01 MED ORDER — OXYCONTIN 60 MG PO T12A
EXTENDED_RELEASE_TABLET | ORAL | 0 refills | Status: DC
  Filled 2024-06-01: qty 60, 30d supply, fill #0

## 2024-06-01 NOTE — Progress Notes (Signed)
 Nursing Focus Note: Discharge Pt discharged at @ 1600. Vitals:   06/01/24 0356 06/01/24 0456 06/01/24 0755 06/01/24 0805  BP:    101/58  Pulse:    99  Resp:    16  Temp:    37.5 C (99.5 F)  TempSrc:    Oral  SpO2:    96%  Weight:      Height:      PainSc: 10-Worst pain ever 0-No pain 10-Worst pain ever   PainLoc: Abdomen  Abdomen    NAD. Pt A&Ox 4.  Chelsea Hansen discharged to home. IV dcd with catheter intact with no s/sx of infection or infiltration.  Pt given copy of d/c instructions and verbalized understanding. POCs d/cd.  Pt left via wheelchair accompanied by transport to front of hospital.

## 2024-06-03 NOTE — ED Triage Notes (Signed)
 Pt presented to ED d/t vomiting and abdominal pain for the past 2 days. Pain radiates to back and bilateral upper extremities.  Pt also complains of leaking colostomy and gtube. Pt recently admitted then discharged last Friday for same reason. With Hx of CA last chemo was September 2025  Past Medical History:  Diagnosis Date   Anxiety    Asthma, unspecified asthma severity, unspecified whether complicated, unspecified whether persistent (HHS-HCC)    History of cancer    Glascow Coma Scale 4 - Opens eyes on own 6 - Follows simple motor commands 5 - Alert and oriented GCS Range: 13-15

## 2024-06-04 ENCOUNTER — Telehealth: Payer: Self-pay | Admitting: *Deleted

## 2024-06-04 NOTE — Transitions of Care (Post Inpatient/ED Visit) (Signed)
 This encounter was created in error - please disregard.  06/04/2024  Name: Chelsea Hansen MRN: [id number] DOB: July 23, 1970  Encounter opened in Error  [name] RN, BSN [place]  [facility] RN Care Manager [phone]

## 2024-06-04 NOTE — Care Plan (Signed)
 Admitted patient due to abdominal pain and nausea and vomiting. Vital signs stable and afebrile. Patients colostomy was leaking; colostomy bag changed and ostomy consult placed. G-tube dressing changed. Patient able to ambulate with standby assist. Patient complaining of 10/10 abdominal pain. PRN oxycodone  given. Plan of care ongoing. Side rails raised and bed locked in lowest position.  Problem: Fall Risk Prevention for Adult Populations Goal: High Risk Fall Prevention - Adults Description: The patient will remain free from falls Outcome: Progressing   Problem: Safety Goal: Free from accidental physical injury Outcome: Progressing Goal: Free from abuse Outcome: Progressing   Problem: Daily Care Goal: Daily care needs are met Description: Assess and monitor ability to perform self care and identify potential discharge needs. Outcome: Progressing   Problem: Pain Goal: Patient's pain/discomfort is manageable Description: Assess and monitor patient's pain using appropriate pain scale. Collaborate with interdisciplinary team and initiate plan and interventions as ordered. Re-assess patient's pain level 30 - 60 minutes after pain management intervention.  Outcome: Progressing   Problem: Compromised Skin Integrity Goal: Skin integrity is maintained or improved Description: Assess and monitor skin integrity. Identify patients at risk for skin breakdown on admission and per policy. Collaborate with interdisciplinary team and initiate plans and interventions as needed. Outcome: Progressing Goal: Fluid and electrolyte balance are achieved/maintained Description: Assess and monitor vital signs (orthostatic vitals if applicable), fluid intake and output, urine color, labs, skin turgor, mucous membranes, jugular venous distention, edema, circumference of edematous extremities and abdominal girth, respiratory status, and mental status.  Monitor for signs and symptoms of hypovolemia (tachycardia, rapid  breathing, decreased urine output, postural hypotension, confusion, syncope).  Monitor for signs and symptoms of hypervolemia (strong rapid pulse, shortness of breath, difficulty breathing lying down, crackles heard in lung fields, edema). Collaborate with interdisciplinary team and initiate plan and interventions as ordered. Outcome: Progressing Goal: Nutritional status is improving Description: Monitor and assess patient for malnutrition (ex- brittle hair, bruises, dry skin, pale skin and conjunctiva, muscle wasting, smooth red tongue, and disorientation). Collaborate with interdisciplinary team and initiate plan and interventions as ordered.  Monitor patient's weight and dietary intake as ordered or per policy. Utilize nutrition screening tool and intervene per policy. Determine patient's food preferences and provide high-protein, high-caloric foods as appropriate.  Outcome: Progressing   Problem: Knowledge Deficit Goal: Patient/family/caregiver demonstrates understanding of disease process, treatment plan, medications, and discharge instructions Description: Complete learning assessment and assess knowledge base. Outcome: Progressing   Problem: Knowledge deficit: Goal: Patient/caregiver ability to state and carry out methods to decrease the pain will improve Description:      Outcome: Progressing Note: Patient satisfied with pain goal.    Goal: Patient/caregiver ability to identify factors that increase the pain will improve Outcome: Progressing Note: Patient satisfied with pain goal.      Problem: Alteration in comfort: Goal: Patient/caregiver's expressions of having a comfortable level of knowledge regarding medication regimen will improve Outcome: Progressing Note: Patient satisfied with pain goal.      Problem: Altered ability to manage pain: Goal: Ability to develop a pain control plan will improve Outcome: Progressing Note: Patient satisfied with pain goal.    Goal:  Pain level will decrease Outcome: Progressing Note: Patient satisfied with pain goal.    Goal: Satisfaction with pain management regimen will improve Outcome: Progressing Note: Patient satisfied with pain goal.    Goal: Patient's ability to cope will be supported Outcome: Progressing Note: Patient satisfied with pain goal.      Problem: Skin Integrity  Impairment for Adult populations Goal: Sensory Perception Description: All Adult patients at medium/high risk based on Braden risk assessment subscale will not develop a pressure injury. Implement prevention interventions based on risk assessment sub-scale scores: Outcome: Progressing Goal: Moisture Description: All Adult patients at medium/high risk based on Braden risk assessment subscale will not develop a pressure injury. Implement prevention interventions based on risk assessment sub-scale scores: Outcome: Progressing Goal: Activity Description: All Adult patients at medium/high risk based on Braden risk assessment subscale will not develop a pressure injury. Implement prevention interventions based on risk assessment sub-scale scores: Outcome: Progressing Goal: Mobility Description: All Adult patients at medium/high risk based on Braden risk assessment subscale will not develop a pressure injury. Implement prevention interventions based on risk assessment sub-scale scores: Outcome: Progressing Goal: Nutrition Description: All Adult patients at medium/high risk based on Braden risk assessment subscale will not develop a pressure injury. Implement prevention interventions based on risk assessment sub-scale scores: Outcome: Progressing Goal: Friction & Shear Description: All Adult patients at medium/high risk based on Braden risk assessment subscale will not develop a pressure injury. Implement prevention interventions based on risk assessment sub-scale scores: Outcome: Progressing   Problem: Active wounds Goal: Active wounds  will progress to healing Description: Assess skin integrity with a head-to-toe skin assessment on admission to hospital/unit, Q shift, and/or when off the unit >4 hours. Outcome: Progressing   Problem: Skin integrity knowledge deficit Goal: Will be able to teach-back wound care and/or pressure injury prevention strategies Description: Assess skin integrity with a head-to-toe skin assessment on admission to hospital/unit, Q shift, and/or when off the unit >4 hours. Outcome: Progressing

## 2024-06-04 NOTE — ED Notes (Signed)
 Discharge instructions reviewed with patient. Pt verbalized understanding, questions and concerns addressed. Vital signs stable at this time and IV lines removed. Pt escorted out of department, confirmed having all personal belongings. No acute distress noted at this time.

## 2024-06-05 ENCOUNTER — Other Ambulatory Visit (HOSPITAL_COMMUNITY): Payer: Self-pay

## 2024-06-05 NOTE — Progress Notes (Signed)
 Problem: Fall Risk Prevention for Adult Populations Goal: High Risk Fall Prevention - Adults Description: The patient will remain free from falls Outcome: Progressing   Problem: Safety Goal: Free from accidental physical injury Outcome: Progressing Goal: Free from abuse Outcome: Progressing   Problem: Daily Care Goal: Daily care needs are met Description: Assess and monitor ability to perform self care and identify potential discharge needs. Outcome: Progressing   Problem: Pain Goal: Patient's pain/discomfort is manageable Description: Assess and monitor patient's pain using appropriate pain scale. Collaborate with interdisciplinary team and initiate plan and interventions as ordered. Re-assess patient's pain level 30 - 60 minutes after pain management intervention.  Outcome: Progressing   Problem: Compromised Skin Integrity Goal: Skin integrity is maintained or improved Description: Assess and monitor skin integrity. Identify patients at risk for skin breakdown on admission and per policy. Collaborate with interdisciplinary team and initiate plans and interventions as needed. Outcome: Progressing Goal: Fluid and electrolyte balance are achieved/maintained Description: Assess and monitor vital signs (orthostatic vitals if applicable), fluid intake and output, urine color, labs, skin turgor, mucous membranes, jugular venous distention, edema, circumference of edematous extremities and abdominal girth, respiratory status, and mental status.  Monitor for signs and symptoms of hypovolemia (tachycardia, rapid breathing, decreased urine output, postural hypotension, confusion, syncope).  Monitor for signs and symptoms of hypervolemia (strong rapid pulse, shortness of breath, difficulty breathing lying down, crackles heard in lung fields, edema). Collaborate with interdisciplinary team and initiate plan and interventions as ordered. Outcome: Progressing Goal: Nutritional status is  improving Description: Monitor and assess patient for malnutrition (ex- brittle hair, bruises, dry skin, pale skin and conjunctiva, muscle wasting, smooth red tongue, and disorientation). Collaborate with interdisciplinary team and initiate plan and interventions as ordered.  Monitor patient's weight and dietary intake as ordered or per policy. Utilize nutrition screening tool and intervene per policy. Determine patient's food preferences and provide high-protein, high-caloric foods as appropriate.  Outcome: Progressing   Problem: Knowledge Deficit Goal: Patient/family/caregiver demonstrates understanding of disease process, treatment plan, medications, and discharge instructions Description: Complete learning assessment and assess knowledge base. Outcome: Progressing   Problem: Discharge Barriers Goal: Patient's discharge needs are met Description: Collaborate with interdisciplinary team and initiate plans and interventions as needed.  Outcome: Progressing   Problem: Knowledge deficit: Goal: Patient/caregiver ability to state and carry out methods to decrease the pain will improve Description:      Outcome: Progressing Note: Patient satisfied with pain goal. Pain Intervention(s): Medication (IV push short acting opioid given)  Goal: Patient/caregiver ability to identify factors that increase the pain will improve Outcome: Progressing Note: Patient satisfied with pain goal. Pain Intervention(s): Medication (IV push short acting opioid given)    Problem: Alteration in comfort: Goal: Patient/caregiver's expressions of having a comfortable level of knowledge regarding medication regimen will improve Outcome: Progressing Note: Patient satisfied with pain goal. Pain Intervention(s): Medication (IV push short acting opioid given)    Problem: Altered ability to manage pain: Goal: Ability to develop a pain control plan will improve Outcome: Progressing Note: Patient satisfied with  pain goal. Pain Intervention(s): Medication (IV push short acting opioid given)  Goal: Pain level will decrease Outcome: Progressing Note: Patient satisfied with pain goal. Pain Intervention(s): Medication (IV push short acting opioid given)  Goal: Satisfaction with pain management regimen will improve Outcome: Progressing Note: Patient satisfied with pain goal. Pain Intervention(s): Medication (IV push short acting opioid given)  Goal: Patient's ability to cope will be supported Outcome: Progressing Note: Patient satisfied with pain  goal. Pain Intervention(s): Medication (IV push short acting opioid given)    Problem: Skin Integrity Impairment for Adult populations Goal: Sensory Perception Description: All Adult patients at medium/high risk based on Braden risk assessment subscale will not develop a pressure injury. Implement prevention interventions based on risk assessment sub-scale scores: Outcome: Progressing Goal: Moisture Description: All Adult patients at medium/high risk based on Braden risk assessment subscale will not develop a pressure injury. Implement prevention interventions based on risk assessment sub-scale scores: Outcome: Progressing Goal: Activity Description: All Adult patients at medium/high risk based on Braden risk assessment subscale will not develop a pressure injury. Implement prevention interventions based on risk assessment sub-scale scores: Outcome: Progressing Goal: Mobility Description: All Adult patients at medium/high risk based on Braden risk assessment subscale will not develop a pressure injury. Implement prevention interventions based on risk assessment sub-scale scores: Outcome: Progressing Goal: Nutrition Description: All Adult patients at medium/high risk based on Braden risk assessment subscale will not develop a pressure injury. Implement prevention interventions based on risk assessment sub-scale scores: Outcome: Progressing Goal:  Friction & Shear Description: All Adult patients at medium/high risk based on Braden risk assessment subscale will not develop a pressure injury. Implement prevention interventions based on risk assessment sub-scale scores: Outcome: Progressing   Problem: Skin Integrity Impairment for Pediatrics populations (includes neonates) Goal: Mobility Description: All Pediatric patients at medium/high risk based on Braden QD risk assessment subscale will not develop a pressure injury. Implement prevention interventions based on risk assessment sub-scale scores: Outcome: Progressing Goal: Sensory Perception Description: All Pediatric patients at medium/high risk based on Braden QD risk assessment subscale will not develop a pressure injury. Implement prevention interventions based on risk assessment sub-scale scores: Outcome: Progressing Goal: Friction & Shear Description: All Pediatric patients at medium/high risk based on Braden QD risk assessment subscale will not develop a pressure injury. Implement prevention interventions based on risk assessment sub-scale scores: Outcome: Progressing Goal: Nutrition Description: All Pediatric patients at medium/high risk based on Braden QD risk assessment subscale will not develop a pressure injury. Implement prevention interventions based on risk assessment sub-scale scores: Outcome: Progressing Goal: Tissue Perfusion & Oxygenation Description: All Pediatric patients at medium/high risk based on Braden QD risk assessment subscale will not develop a pressure injury. Implement prevention interventions based on risk assessment sub-scale scores: Outcome: Progressing Goal: Medical Devices Description: All Pediatric patients at medium/high risk based on Braden QD risk assessment subscale will not develop a pressure injury. Implement prevention interventions based on risk assessment sub-scale scores: Outcome: Progressing Goal: Repositionability / Skin  Protection Description: All Pediatric patients at medium/high risk based on Braden QD risk assessment subscale will not develop a pressure injury. Implement prevention interventions based on risk assessment sub-scale scores: Outcome: Progressing   Problem: Active wounds Goal: Active wounds will progress to healing Description: Assess skin integrity with a head-to-toe skin assessment on admission to hospital/unit, Q shift, and/or when off the unit >4 hours. Outcome: Progressing   Problem: Skin integrity knowledge deficit Goal: Will be able to teach-back wound care and/or pressure injury prevention strategies Description: Assess skin integrity with a head-to-toe skin assessment on admission to hospital/unit, Q shift, and/or when off the unit >4 hours. Outcome: Progressing

## 2024-06-06 ENCOUNTER — Ambulatory Visit: Payer: MEDICAID

## 2024-06-07 ENCOUNTER — Other Ambulatory Visit (HOSPITAL_COMMUNITY): Payer: Self-pay

## 2024-06-09 ENCOUNTER — Other Ambulatory Visit (HOSPITAL_COMMUNITY): Payer: Self-pay

## 2024-06-11 ENCOUNTER — Other Ambulatory Visit (HOSPITAL_COMMUNITY): Payer: Self-pay

## 2024-06-11 ENCOUNTER — Telehealth: Payer: Self-pay | Admitting: *Deleted

## 2024-06-11 NOTE — Transitions of Care (Post Inpatient/ED Visit) (Signed)
" ° °  06/11/2024  Name: HANALEI GLACE MRN: [id number] DOB: 04/28/1971  Today's TOC FU Call Status: Today's TOC FU Call Status:: Unsuccessful Call (1st Attempt) Unsuccessful Call (1st Attempt) Date: 06/11/24  Attempted to reach the patient regarding the most recent Inpatient/ED visit.  Follow Up Plan: Additional outreach attempts will be made to reach the patient to complete the Transitions of Care (Post Inpatient/ED visit) call.   [name] RN, BSN [place]  [facility] RN Care Manager [phone]  "

## 2024-06-12 ENCOUNTER — Telehealth: Payer: Self-pay | Admitting: *Deleted

## 2024-06-12 ENCOUNTER — Other Ambulatory Visit (HOSPITAL_COMMUNITY): Payer: Self-pay

## 2024-06-12 MED ORDER — DULOXETINE HCL 30 MG PO CPEP
30.0000 mg | ORAL_CAPSULE | Freq: Every day | ORAL | 1 refills | Status: AC
  Filled 2024-06-12: qty 30, 30d supply, fill #0

## 2024-06-12 MED ORDER — POLYETHYLENE GLYCOL 3350 17 GM/SCOOP PO POWD
ORAL | 3 refills | Status: AC
  Filled 2024-06-12: qty 476, 28d supply, fill #0

## 2024-06-12 NOTE — Transitions of Care (Post Inpatient/ED Visit) (Signed)
" ° °  06/12/2024  Name: Chelsea Hansen MRN: [id number] DOB: 05/18/1971  Today's TOC FU Call Status: Today's TOC FU Call Status:: Unsuccessful Call (2nd Attempt) Unsuccessful Call (2nd Attempt) Date: 06/12/24  Attempted to reach the patient regarding the most recent Inpatient/ED visit.  Follow Up Plan: Additional outreach attempts will be made to reach the patient to complete the Transitions of Care (Post Inpatient/ED visit) call.   [name] RN, BSN [place]  [facility] RN Care Manager [phone]  "

## 2024-06-13 ENCOUNTER — Ambulatory Visit: Payer: MEDICAID | Attending: Internal Medicine

## 2024-06-13 ENCOUNTER — Telehealth: Payer: Self-pay

## 2024-06-13 ENCOUNTER — Other Ambulatory Visit (HOSPITAL_COMMUNITY): Payer: Self-pay

## 2024-06-13 DIAGNOSIS — C786 Secondary malignant neoplasm of retroperitoneum and peritoneum: Secondary | ICD-10-CM | POA: Insufficient documentation

## 2024-06-13 DIAGNOSIS — C7951 Secondary malignant neoplasm of bone: Secondary | ICD-10-CM | POA: Insufficient documentation

## 2024-06-13 DIAGNOSIS — M6281 Muscle weakness (generalized): Secondary | ICD-10-CM | POA: Insufficient documentation

## 2024-06-13 DIAGNOSIS — G893 Neoplasm related pain (acute) (chronic): Secondary | ICD-10-CM | POA: Insufficient documentation

## 2024-06-13 NOTE — Telephone Encounter (Signed)
 Called pt due to no show.  Had appts confused and thought it was a telehealth appt with someone else. Told her about her appt on 06/20/2024, and encouraged her to call by Tuesday if unable to attend appt.  She is not interested in having HHPT.

## 2024-06-20 ENCOUNTER — Ambulatory Visit: Payer: MEDICAID

## 2024-06-22 ENCOUNTER — Other Ambulatory Visit: Payer: Self-pay

## 2024-06-22 NOTE — Progress Notes (Signed)
 Patient called to have Truqap transferred from [facility]. No longer seen by [facility]. Shared information with Annabella, who will coordinate transfer and onboarding.

## 2024-06-22 NOTE — Progress Notes (Signed)
 Called and spoke with [facility]- they have Truqap in stock and copay is $4. Patient's daughter is aware and she is going to contact [facility] to confirm they have medication. If she needs us  to fill she will call me back.

## 2024-06-22 NOTE — Progress Notes (Signed)
 [facility] CLINICAL SOCIAL WORK NOTE  PATIENT INFORMATION:  Chelsea Hansen female 1971/01/22 [id number]   REFERRAL SOURCE: Provider (Dr. Floretta)   REASON FOR REFERRAL:  Advanced Care Planning / End of Life Issues, Adjustment to Illness / Injury, Care Coordination  IDENTIFYING STATEMENT: Chelsea Hansen is a 54 y.o. female followed at [facility], [facility] for treatment of metastatic breast cancer.  Patient lives in [place] and is insured with [facility].   CLINICAL IMPRESSION/INTERVENTION: This Oncology CSW engaged with patient in clinic.  Patient presents with appropriate affect and congruent engagement. CSW conducted psychosocial screening. Patient reports that she would like to change her DPOA-HC.  Specifically, she would like to remove her daughter, Ashleen Demma, as the primary health care agent and appointment her son, Dava Borer, as primary (while keeping Josetta Pringle (sister) and Curtistine Albee (son) as alternate agents).  Patient provided with blank advance directive.  She plans to take the form home and give further consideration to her wishes with regard to the living will portion.  Patient will bring the advance directive back on 06/28/24 for signature, witnesses, and notarization.    Patient inquired about counseling for adjustment to illness and life stressors.  She was agreeable to referral to MFT with [facility].  CSW sent referral to [facility].  Patient has the contact number to CSW for further questions, concerns, and/or need for support.  Assessed coping which is adequate for the situation .  CLINICAL INTERVENTION PROVIDED: Advanced Care Planning/Palliative Care, Care Coordination and Advocacy, Psychosocial Assessment/Screening  PLAN: This CSW will remain available to assist patient as needed with psychosocial needs. CSW provided contact information so patient may contact this CSW with any psychosocial needs  or concerns.   Rollene Kingdom, MSW, LCSW Oncology Clinical Social Worker [phone]

## 2024-06-24 ENCOUNTER — Other Ambulatory Visit (HOSPITAL_COMMUNITY): Payer: Self-pay

## 2024-06-27 ENCOUNTER — Ambulatory Visit: Payer: MEDICAID

## 2024-07-04 ENCOUNTER — Ambulatory Visit: Payer: MEDICAID

## 2024-07-13 ENCOUNTER — Other Ambulatory Visit (HOSPITAL_COMMUNITY): Payer: Self-pay

## 2024-07-13 MED ORDER — OXYCODONE HCL 15 MG PO TABS
15.0000 mg | ORAL_TABLET | ORAL | 0 refills | Status: AC
  Filled 2024-07-13: qty 90, 15d supply, fill #0

## 2024-07-20 ENCOUNTER — Other Ambulatory Visit (HOSPITAL_COMMUNITY): Payer: Self-pay

## 2024-07-20 MED ORDER — OXYCODONE HCL ER 80 MG PO T12A
80.0000 mg | EXTENDED_RELEASE_TABLET | Freq: Two times a day (BID) | ORAL | 0 refills | Status: AC
  Filled 2024-07-20: qty 60, 30d supply, fill #0

## 2024-07-20 NOTE — Progress Notes (Signed)
 "  Breast Clinic Return Evaluation Medical Oncology  Identifying Statement: Chelsea Hansen [id number] is a 54 y.o. female from [place] [id number] seen for metastatic breast cancer.  Physician Requesting Consultation: Self Patient Care Team: Unknown (Inactive) as PCP - General Alease Yancy Bare, NP as Nurse Practitioner (Hospice and Palliative Medicine)  Oncologic History: Oncology History Overview Note  Oncology History ([place]):   11/18/2020: palpable lump in right breast (hx of bx proven fibroadenoma);  Bilateral Diagnostic Mammogram:   1. Highly suspicious 3.2 cm mass associated with architectural   distortion and scattered calcifications in the UPPER OUTER QUADRANT   of the RIGHT breast at the 10 o'clock position 6 cm from the nipple,   corresponding to the palpable concern.   2. Indeterminate new 1.1 cm mass in the UPPER OUTER QUADRANT of the   RIGHT breast at the 9:30 o'clock position 8 cm from the nipple.   3. Solitary pathologic RIGHT axillary lymph node with eccentric   cortical thickening up to 7 mm.   4. Highly suspicious 0.8 cm mass involving the UPPER OUTER QUADRANT   of the LEFT breast.   5. No pathologic LEFT axillary lymphadenopathy    12/03/2020 Initial Diagnosis - Bilateral breast biopsies   Right breast: cT2 N1, stage IIa invasive lobular carcinoma, grade 2, ER/PR strongly positive, HER2 not amplified, with an MIB-1 of 40%  (a) the biopsied right axillary lymph node was positive with extracapsular extension  (b) a second right breast mass also biopsied was a fibroadenoma, concordant   MAMMAPRINT tested on biopsy returned high risk, luminal type B, indicating significant benefit from chemo    12/25/2023: [facility] BRCAplus panel (report date 12/24/2020) or the CancerNext-Expanded + RNAinsight panel (report date 12/31/2020). 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    01/27/2021-03/24/2021: NACT -  Doxorubicin /Cyclophosphamide  x 4, weekly Taxol  x 11.   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 Multifocal sclerotic bony metastatic disease greatest in the  thoracic spine with scattered rib lesions and sclerosis as well.  2. No nodal or visceral metastasis about the chest.  3. Collateral pathways about the chest suggest some subclavian  venous narrowing on the LEFT. This could also be related to arm  position. Correlate clinically.  4. Signs of RIGHT breast lumpectomy. Potential postoperative changes  in the LEFT breast with small focal area of nodularity in the  lateral LEFT breast which could also be postoperative, correlate  clinically and consider mammographic correlation as warranted.  5. Mild cardiac enlargement.    05/18/2022: Anastrozole  + Verzenio  100mg  BID started    06/06/2022: MRI L Spine:  1. Skeletal metastatic disease which is likely treated/quiescent -   including an L3 pathologic vertebral body fracture with mild   comminution and enhancement, but little if any marrow edema and did   NOT appear hypermetabolic on recent PET-CT.   2. No lumbar dural or intrathecal metastases. And capacious spinal   canal with no spinal  stenosis despite some degeneration.    06/23/2022-07/07/2022: Palliative RT L spine   06/10/23: PET/CT: Interval development of hypermetabolic lymphadenopathy left submandibular region, throughout mediastinal, bilateral hilar, upper abdomen (no uptake in the bones)     12/22/2023: CT CAP: Bilateral pulmonary micronodules, persistent vague fat stranding along the proximal pancreas with query underlying mass. Recommend MRI pancreatic protocol. Scattered sclerotic osseous lesions.   12/23/2023: MRI Abdomen MRCP:   1. Subtly expansile, masslike appearance of the pancreatic head and   uncinate as seen by prior CT, measuring 2.5 x 2.3 cm. This   appearance is new in the interval to prior restaging PET-CT dated   06/10/2023, and is highly concerning for pancreatic adenocarcinoma.   2. Common bile duct at the upper limit of normal in caliber   measuring up to 0.7 cm. No gallstones.   3. Prominence of the pancreatic duct measuring up to 0.4 cm.   4. Heterogeneously enhancing vertebral metastases, in keeping with   known metastatic breast cancer.   5. Pancolonic diverticulosis.    02/02/24: CT abdomen and pelvis: Pancreatic mass not well visualized, progressive extrahepatic biliary ductal dilation, right hydronephrosis, peritoneal carcinomatosis, bone metastases L3   02/03/2024: Peritoneal biopsy: Metastatic breast cancer, ER 100%, PR 0%, HER2 2+    02/16/2024: EnHerTu  started   03/04/2024-03/09/2024: Hospitalized for Abdominal wall abscess   03/05/2024: CT abdomen and pelvis: Improved abscess 1.8 cm, dilation of extrahepatic bile duct, omental infiltration with peritoneal carcinomatosis, diffuse bone metastases    Multiple hospital admissions for abdominal pain, bowel obstructions, G tube leak, most recently hypocalcemia, also hypomagnemesia, hypokalemia. Initiated hospice care in 03/2024.  05/08/2024: Suspended hospice services for second opinion at [facility].  05/24/2024: [id number] with PTEN and PIK3CA mutations. C1D1 Fulvestrant. Plan to add capivasertib as labs improve.  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   06/25/2024: Capivasertib C1D1  dose reduced at 200 mg BID.   Breast cancer metastasized to bone, right (CMS/HHS-HCC)  05/20/2022 Initial Diagnosis   Breast cancer metastasized to bone, right (CMS/HHS-HCC)   05/24/2024 -  Chemotherapy   OP A Breast fulvestrant 500mg  IM 28 day Plan Provider: Kandyce Archer, MD Treatment goal: Palliative Line of treatment: [No plan line of treatment]   Bilateral breast cancer (CMS/HHS-HCC)  11/18/2020 Initial Diagnosis   Bilateral breast cancer (CMS/HHS-HCC)   05/24/2024 -  Chemotherapy   OP A Breast fulvestrant 500mg  IM 28 day Plan Provider: Kandyce Archer, MD Treatment goal: Palliative Line of treatment: [No plan line of treatment]    History of Present Illness:  Chelsea Hansen is a 53 y.o. woman who presents for second opinion for metastatic ER+ breast cancer. PMHx is notable for cancer-related recurrent bowel obstructions requiring G-tube and recurrent hospitalizations for G-tube site leaks, poor PO intake, nausea/vomiting, electrolyte derangements and septic shock.   Please see oncology history above for complete presenting details. Briefly, patient was diagnosed with right breast cancer in 2022, cT2 cN1 ILC, G2, ER/PR+ HER-, with mammaprint showing high risk luminal B type disease. She underwent neoadjuvant chemotherapy with AC-T (01/2021) followed by lumpectomy, which showed extensive residual disease, ypT2 ypN1a and left lumpectomy with HG DCIS. She underwent adjuvant XRT and was started on AET. Only a few months later, 04/2022, she was discovered to have bone mets on a CT for chest wall pain and abemaciclib  was added to her AI. She had palliative RT to L spine. On record  review, she subsequently had evidence of disease progression in late 2024 involving lymph nodes, abdomen and pancreas. She had a peritoneal biopsy that confirmed metastatic breast cancer with ER 100% PR 0% and HER2 2+. Enhertu  was started. She subsequently had several hospitalizations for abdominal pain, bowel  obstructions, G-tube leaks, electrolyte derangements. Goals of care were discussed and she was initiated on hospice services, which she suspended to seek care at [facility] in 04/2024. She had negative germline genetics with a VUS in ATM p.D44G. Guardant360 returned with PTEN and PIK3CA mutations. She was initiated on fulvestrant C1D1 05/24/2024 and capivasertib C1D1 06/25/2024.  Interval History: She presents for follow up for close monitoring on capivasertib and fulvestrant, accompanied to this visit by her son and daughter-in-law, who have been helping with her care. Today is C1D26 of her capivasertib. She just completed her 4 days on, and is now on her 3 days off of capivasertib. No issues with fulvestrant.  At her visit two weeks ago,she received 1u pRBC for symptomatic anemia, electrolyte repletion and IV anti-emetics, for which she found helpful.  Since her last visit, she reports 24hr of vomiting and diarrhea on Tuesday, 07/18/23. Feels she had a 24hr viral illness. Also reports eating shrimp the night before. No further episodes of vomiting or diarrhea. Feels nausea is overall improved. Denies fever. No sick contacts. Is generally feeling improved today. Does note improved appetite and weight gain (current weight 164lb, prior 155lb). She also notes new bilateral peripheral edema in the last week. Feels both of her ankles/feet are a little swollen. No pain, warmth, discoloration or leg edema. Denies chest pain, dyspnea, cough, palpitations, DOE,orthopnea, abdominal pain or distention. She is overall tolerating oral intake and is trying to increase her fluid intake.  She continues to experience significant pain and worsening wound complications at the gastrostomy tube site, describing the wound as horrible with ongoing discomfort. She expresses concern regarding the cost and supply of ostomy products. She is now scheduled with wound care next week on 07/27/24. She is also requesting refills of compazine ,  zofran , zyrtec, and oxycontin . She is scheduled to meet with her palliative team next week on 07/26/24.   Past Medical History: Past Medical History:  Diagnosis Date   Anxiety    Asthma, unspecified asthma severity, unspecified whether complicated, unspecified whether persistent (HHS-HCC)    History of cancer     Past Surgical History: Past Surgical History:  Procedure Laterality Date   LAPAROSCOPIC CREATION GASTRJEJONOSTOMY  01/02/2024   Dr Lyndel   laparoscopic insertion of gastrostomy tube, laparoscopic insertion of jejunostomy tube, upper endoscopy  01/10/2024   Dr Stevie    lap creation of gastrojejunostomy      HYSTERECTOMY     Family History: Family History  Problem Relation Age of Onset   Breast cancer Mother     Social History: Social History   Socioeconomic History   Marital status: Single  Tobacco Use   Smoking status: Former    Types: Cigarettes   Smokeless tobacco: Never  Vaping Use   Vaping status: Unknown  Substance and Sexual Activity   Alcohol  use: Never   Drug use: Never   Social Drivers of Corporate Investment Banker Strain: Low Risk  (06/04/2024)   Overall Financial Resource Strain (CARDIA)    Difficulty of Paying Living Expenses: Not hard at all  Food Insecurity: No Food Insecurity (06/04/2024)   Hunger Vital Sign    Worried About Running Out of Food in the Last Year: Never  true    Ran Out of Food in the Last Year: Never true  Transportation Needs: No Transportation Needs (06/04/2024)   PRAPARE - Administrator, Civil Service (Medical): No    Lack of Transportation (Non-Medical): No  Physical Activity: Inactive (02/13/2024)   Received from [facility]   Exercise Vital Sign    On average, how many days per week do you engage in moderate to strenuous exercise (like a brisk walk)?: 0 days    On average, how many minutes do you engage in exercise at this level?: 0 min  Stress: No Stress Concern Present  (02/13/2024)   Received from [facility] - Occupational Stress Questionnaire    Do you feel stress - tense, restless, nervous, or anxious, or unable to sleep at night because your mind is troubled all the time - these days?: Not at all  Social Connections: Moderately Isolated (05/20/2024)   Received from [facility]   Social Connection and Isolation Panel    In a typical week, how many times do you talk on the phone with family, friends, or neighbors?: More than three times a week    How often do you get together with friends or relatives?: Once a week    How often do you attend church or religious services?: 1 to 4 times per year    Do you belong to any clubs or organizations such as church groups, unions, fraternal or athletic groups, or school groups?: No    How often do you attend meetings of the clubs or organizations you belong to?: Never    Are you married, widowed, divorced, separated, never married, or living with a partner?: Never married  Housing Stability: Low Risk  (06/04/2024)   Housing Stability Vital Sign    Unable to Pay for Housing in the Last Year: No    Number of Times Moved in the Last Year: 0    Homeless in the Last Year: No    Psycho-Social History: Social History   Socioeconomic History   Marital status: Single  Tobacco Use   Smoking status: Former    Types: Cigarettes   Smokeless tobacco: Never  Vaping Use   Vaping status: Unknown  Substance and Sexual Activity   Alcohol  use: Never   Drug use: Never   Social Drivers of Corporate Investment Banker Strain: Low Risk  (06/04/2024)   Overall Financial Resource Strain (CARDIA)    Difficulty of Paying Living Expenses: Not hard at all  Food Insecurity: No Food Insecurity (06/04/2024)   Hunger Vital Sign    Worried About Running Out of Food in the Last Year: Never true    Ran Out of Food in the Last Year: Never true  Transportation Needs: No  Transportation Needs (06/04/2024)   PRAPARE - Administrator, Civil Service (Medical): No    Lack of Transportation (Non-Medical): No  Physical Activity: Inactive (02/13/2024)   Received from [facility]   Exercise Vital Sign    On average, how many days per week do you engage in moderate to strenuous exercise (like a brisk walk)?: 0 days    On average, how many minutes do you engage in exercise at this level?: 0 min  Stress: No Stress Concern Present (02/13/2024)   Received from [facility] of Occupational Health - Occupational Stress Questionnaire    Do you feel stress - tense, restless, nervous, or anxious,  or unable to sleep at night because your mind is troubled all the time - these days?: Not at all  Social Connections: Moderately Isolated (05/20/2024)   Received from [facility]   Social Connection and Isolation Panel    In a typical week, how many times do you talk on the phone with family, friends, or neighbors?: More than three times a week    How often do you get together with friends or relatives?: Once a week    How often do you attend church or religious services?: 1 to 4 times per year    Do you belong to any clubs or organizations such as church groups, unions, fraternal or athletic groups, or school groups?: No    How often do you attend meetings of the clubs or organizations you belong to?: Never    Are you married, widowed, divorced, separated, never married, or living with a partner?: Never married  Housing Stability: Low Risk  (06/04/2024)   Housing Stability Vital Sign    Unable to Pay for Housing in the Last Year: No    Number of Times Moved in the Last Year: 0    Homeless in the Last Year: No   Allergies: Allergies  Allergen Reactions   Denosumab  Other (See Comments)    Severe prolonged hypocalcemia needing hospitalization    Current Medications: Current Outpatient Medications  Medication Sig Dispense Refill    acetaminophen  (TYLENOL ) 325 MG tablet 650 mg     albuterol  90 mcg/actuation inhaler TAKE 2 PUFFS BY MOUTH EVERY 6 HOURS AS NEEDED FOR WHEEZE OR SHORTNESS OF BREATH     blood glucose diagnostic test strip Use 1 each (1 strip total) once daily as instructed. 100 each 3   blood-glucose meter Misc Use as directed 1 each 0   calcitRIOL  (ROCALTROL ) 0.5 MCG capsule Take 0.5 mcg by mouth     capivasertib (TRUQAP) 200 mg tablet Take 200 mg (1 x 200mg  tablets) twice daily with food, for 4 days followed by 3 days OFF of a 28 day cycle 32 tablet 1   cetirizine (ZYRTEC) 10 MG tablet Take 1 tablet (10 mg total) by mouth once daily 30 tablet 2   DULoxetine  (CYMBALTA ) 30 MG DR capsule Take 1 capsule (30 mg total) by mouth at bedtime 30 capsule 1   lancets Use 1 each once daily as instructed. 100 each 3   magnesium  oxide (MAG-OX) 400 mg (241.3 mg magnesium ) tablet Take 1 tablet (400 mg total) by mouth once daily (Patient not taking: Reported on 07/13/2024) 30 tablet 11   naloxone  (NARCAN ) 4 mg/actuation nasal spray Place 1 spray (4 mg total) into one nostril once as needed (if not breathing or overdose is suspected.) for up to 1 dose Give 2nd dose in 5-10 min if not responding or if sx return for up to 1 dose. 2 each 1   OLANZapine  (ZYPREXA ) 2.5 MG tablet Take 1 tablet (2.5 mg total) by mouth at bedtime for 30 days 30 tablet 0   OLANZapine  (ZYPREXA ) 2.5 MG tablet Take 1 tablet (2.5 mg total) by mouth at bedtime As needed for nausea (Patient not taking: Reported on 07/13/2024) 30 tablet 0   ondansetron  (ZOFRAN ) 8 MG tablet Take 1 tablet (8 mg total) by mouth every 12 (twelve) hours as needed for Nausea 30 tablet 1   oxyCODONE  (OXYCONTIN ) 60 mg ER tablet Take 1 tablet (60 mg total) by mouth every 12 (twelve) hours 60 tablet 0   oxyCODONE  (ROXICODONE ) 15  MG immediate release tablet Take 1 tablet (15 mg total) by mouth every 4 (four) hours as needed for Pain for up to 15 days 90 tablet 0   polyethylene  glycol (MIRALAX ) powder Take 17 g by mouth (Patient not taking: Reported on 07/13/2024)     polyethylene glycol (MIRALAX ) powder Take 17 g by mouth once daily Take 1 capful in the morning if you have not had a bowel movement in 2-3 days.  Mix in 4-8ounces of fluid prior to taking.  You may take an additional dose midday if needed. 510 g 3   prochlorperazine  (COMPAZINE ) 10 MG tablet Take 1 tablet (10 mg total) by mouth every 6 (six) hours as needed for Nausea 30 tablet 1   sennosides (SENOKOT) 8.6 mg tablet Take 2 tablets by mouth 2 (two) times daily (Patient not taking: Reported on 07/13/2024)     sodium chloride  0.9% flush injection 10 mLs by Intracatheter route as directed for [facility] 180 each 1   No current facility-administered medications for this visit.    Objective:    Physical Exam: BP 107/69 (BP Location: Right upper arm, Patient Position: Sitting, BP Cuff Size: Adult)   Pulse 68   Temp 36.9 C (98.4 F) (Oral)   Resp 18   Wt 74.2 kg (163 lb 9.3 oz)   SpO2 100%   BMI 22.81 kg/m Body surface area is 1.93 meters squared.  ECOG Performance Status: 1-2 General appearance: Chronically ill-appearing, comfortable, no acute distress.  HEENT: EOMI, sclerae anicteric.  Lymph Nodes: No cervical, supraclavicular or axillary lymphadenopathy. Cardiac: RRR, without murmur, rub, or gallop. +R chest port. Lungs: Breath sounds are clear to auscultation. Abdomen: Left-sided EC fistula with somewhat fibrinous exudate and overlying ostomy bag. G-tube with gauze dressing.  Extremities: No clubbing, cyanosis. Mild b/l pedal edema, +1 pitting edema, nontender, no discoloration, warmth Skin: Color and texture normal. No rashes or lesions.  Neurologic: Alert and oriented x 3.  Laboratory: CBC with Hgb 7.6 WBC 4.8 PLT 258  CMP with K 3.3 Mg 1.4 Cr 0.8 AP 562 AST 11 ALT 6 Tbili 0.2 Ca 7.8  Guardant360 Somatic   Contains abnormal data PTEN ( Tier 1: Strong significance )  R130*VAF:  1.4%   Contains abnormal data ATM ( Tier 2: Potential significance )  D317Ifs*3VAF: 0.08%   Contains abnormal data PIK3CA ( Tier 2: Potential significance )  [id number]: 1%   Contains abnormal data PIK3CA ( Tier 2: Potential significance )  Q75EVAF: 0.1%   Imaging: No interval imaging.  Impression/Plan:  Ms. JANVI AMMAR is a 54 y.o. woman who presents for follow up for metastatic ER+ breast cancer. PMHx is notable for cancer-related recurrent bowel obstructions requiring G-tube and recurrent hospitalizations for G-tube site leaks, poor PO intake, nausea/vomiting, electrolyte derangements and septic shock.   # Metastatic breast cancer, ER+ HER2-low - Previously reviewed patient's diagnosis, imaging, pathology, treatment history to date. Patient expresses interest in treatment of her breast cancer and has suspended her hospice services. We discussed my concern regarding her recurrent hospitalizations over the past year. I reviewed her treatment history with her local oncologist Dr. Odean who reports most recent treatment was Enhertu  on 02/16/24 and 03/14/24, dose-reduced and still leading to hospitalization for electrolyte derangements. We discussed my concern regarding cytotoxic chemotherapy, but her performance status appears somewhat improved and she appears to be a candidate for targeted therapy. - We obtained Guardant360 PB testing, which revealed targetable mutations in PTEN and PIK3CA. As such, patient  is a candidate for fulvestrant in combination with capivasertib. C1D1 of fulvestrant was 05/24/2024. Capivasertib C1D1 06/26/2023 at dose-reduced 200 mg.  - Reviewed treatment calendar with patient and her son (capivasertib 200 mg twice daily for 4 days ON and 3 days OFF - 28 day cycle) and supportive medications.  - Staging scans done in 05/2024 with significant osseous disease, but without evidence of visceral disease. Peritoneal carcinomatosis last visualized on scan on 04/01/2024. Given profound,  refractory hypocalcemia requiring multiple hospitalizations, attributed in part to denosumab , will hold off on additional bisphosphonate therapy at this time and consider it in the future with stability of calcium . - Today, 07/20/24, she is overall tolerating treatment without significant toxicity, with ongoing cancer-related pain and episodic nausea likely related to peritoneal disease. Reviewed labs, will continue capivasertib at current dose with fulvestrant today. Given Hgb of 7.6 , will send for 1U RBC today. - Continue short- and long-acting oxycodone  for pain control; Palliative Care follow up now re-scheduled for 07/26/24. Will reach out to her palliative team for requested refill of oxycontin . Refills provided of zofran , compazine  and cetirizine today. - Discussed repeating staging scans in 2-3 months to assess treatment response.   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 IR was consulted while inpatient and felt that removal at this time was not advised given concern for forming additional EC fistula. - Now scheduled with Wound Care next week, on 07/27/24.  - Will consider referral to Surgery team for removal in the future  # B/l Peripheral Edema > Mild pedal edema, reported as new in the last week. No other symptoms (ie chest pain, shortness of breath, DOE, orthopnea, abdominal pain or distension). -Will send for ECHO and closely monitor -ED precautions reviewed   FOLLOW-UP: RTC in 2 weeks for ECHO, toxicity check and treatment clearance    Attestation:  I personally performed the service. (TP)  I spent a total of  42 minutes in both face-to-face and non-face-to-face activities for this visit on the date of this encounter.  Manuelita Lesser, PA-C Breast Medical Oncology [facility]  "

## 2024-07-20 NOTE — Progress Notes (Signed)
 PE Chaperone note: No sensitive exam performed      NCCN Distress Questionnaire  Questionnaire Response Comments  Patient declines NCCN Distress Questionnaire:        Distress Thermometer Score (0-10) 0 - No distress   Comments:     Concern Response Comments  Physical  Concerns None of the above (G tube)    Emotional Concerns Worry or anxiety    Social Concerns None of the above    Practical Concerns None of the above    Spiritual/Religious Concerns None of the above    Other Concerns     Resources Provided      Referrals Provided

## 2024-07-20 NOTE — Progress Notes (Signed)
 Patient was seen in [facility] for Fulvestrant IM syringe 500 mg injection.  Fulvestrant IM syringe 250 mg injection was given in the right and left upper outer quadrant of the buttocks for a total of 500 mg. Magnesium  sulfate 4g/100 mL IVPB and 1 unit of packed RBCs given in right upper chest port which was already accessed. Great blood return noted prior to infusion. After infusions completed, blood return checked and noted. Line flushed with saline and port de-accessed.  Potassium chloride  oral powder 40 mEq given by mouth.  Patient tolerated well with no complaints of nausea, vomiting, diarrhea, mouth sores, or pain.  Patient denies falls at home.  Patient discharged to home with no needs expressed at this time.

## 2024-07-23 ENCOUNTER — Other Ambulatory Visit (HOSPITAL_COMMUNITY): Payer: Self-pay

## 2024-07-24 ENCOUNTER — Other Ambulatory Visit (HOSPITAL_COMMUNITY): Payer: Self-pay

## 2024-07-27 ENCOUNTER — Other Ambulatory Visit: Payer: Self-pay

## 2024-08-01 ENCOUNTER — Other Ambulatory Visit (HOSPITAL_COMMUNITY): Payer: MEDICAID

## 2024-10-11 ENCOUNTER — Other Ambulatory Visit (HOSPITAL_COMMUNITY): Payer: MEDICAID
